# Patient Record
Sex: Female | Born: 1940
Health system: Southern US, Community
[De-identification: ages and names within clinical notes are randomized; demographics above are authoritative.]

## PROBLEM LIST (undated history)

## (undated) DIAGNOSIS — F32A Depression, unspecified: Secondary | ICD-10-CM

## (undated) DIAGNOSIS — H3530 Unspecified macular degeneration: Secondary | ICD-10-CM

## (undated) DIAGNOSIS — M79604 Pain in right leg: Secondary | ICD-10-CM

## (undated) DIAGNOSIS — F449 Dissociative and conversion disorder, unspecified: Secondary | ICD-10-CM

## (undated) DIAGNOSIS — I499 Cardiac arrhythmia, unspecified: Secondary | ICD-10-CM

## (undated) DIAGNOSIS — Z8781 Personal history of (healed) traumatic fracture: Secondary | ICD-10-CM

## (undated) DIAGNOSIS — H269 Unspecified cataract: Secondary | ICD-10-CM

## (undated) DIAGNOSIS — M1990 Unspecified osteoarthritis, unspecified site: Secondary | ICD-10-CM

## (undated) DIAGNOSIS — I10 Essential (primary) hypertension: Secondary | ICD-10-CM

## (undated) DIAGNOSIS — F319 Bipolar disorder, unspecified: Secondary | ICD-10-CM

## (undated) DIAGNOSIS — M25519 Pain in unspecified shoulder: Secondary | ICD-10-CM

## (undated) DIAGNOSIS — G4730 Sleep apnea, unspecified: Secondary | ICD-10-CM

## (undated) DIAGNOSIS — J849 Interstitial pulmonary disease, unspecified: Secondary | ICD-10-CM

## (undated) DIAGNOSIS — H35389 Toxic maculopathy, unspecified eye: Secondary | ICD-10-CM

## (undated) DIAGNOSIS — M79605 Pain in left leg: Secondary | ICD-10-CM

## (undated) DIAGNOSIS — G4734 Idiopathic sleep related nonobstructive alveolar hypoventilation: Secondary | ICD-10-CM

## (undated) DIAGNOSIS — I2510 Atherosclerotic heart disease of native coronary artery without angina pectoris: Secondary | ICD-10-CM

## (undated) DIAGNOSIS — N189 Chronic kidney disease, unspecified: Secondary | ICD-10-CM

## (undated) DIAGNOSIS — F419 Anxiety disorder, unspecified: Secondary | ICD-10-CM

## (undated) DIAGNOSIS — M48061 Spinal stenosis, lumbar region without neurogenic claudication: Secondary | ICD-10-CM

## (undated) DIAGNOSIS — I509 Heart failure, unspecified: Secondary | ICD-10-CM

## (undated) DIAGNOSIS — I38 Endocarditis, valve unspecified: Secondary | ICD-10-CM

## (undated) DIAGNOSIS — K219 Gastro-esophageal reflux disease without esophagitis: Secondary | ICD-10-CM

## (undated) DIAGNOSIS — J449 Chronic obstructive pulmonary disease, unspecified: Secondary | ICD-10-CM

## (undated) DIAGNOSIS — F329 Major depressive disorder, single episode, unspecified: Secondary | ICD-10-CM

## (undated) DIAGNOSIS — I6523 Occlusion and stenosis of bilateral carotid arteries: Secondary | ICD-10-CM

## (undated) DIAGNOSIS — B019 Varicella without complication: Secondary | ICD-10-CM

## (undated) DIAGNOSIS — N183 Chronic kidney disease, stage 3 unspecified: Secondary | ICD-10-CM

## (undated) DIAGNOSIS — K279 Peptic ulcer, site unspecified, unspecified as acute or chronic, without hemorrhage or perforation: Secondary | ICD-10-CM

## (undated) DIAGNOSIS — A0472 Enterocolitis due to Clostridium difficile, not specified as recurrent: Secondary | ICD-10-CM

## (undated) DIAGNOSIS — R011 Cardiac murmur, unspecified: Secondary | ICD-10-CM

## (undated) DIAGNOSIS — M1380 Other specified arthritis, unspecified site: Secondary | ICD-10-CM

## (undated) DIAGNOSIS — J45909 Unspecified asthma, uncomplicated: Secondary | ICD-10-CM

## (undated) DIAGNOSIS — I739 Peripheral vascular disease, unspecified: Secondary | ICD-10-CM

## (undated) DIAGNOSIS — E785 Hyperlipidemia, unspecified: Secondary | ICD-10-CM

## (undated) DIAGNOSIS — S92514A Nondisplaced fracture of proximal phalanx of right lesser toe(s), initial encounter for closed fracture: Secondary | ICD-10-CM

## (undated) HISTORY — DX: Unspecified cataract: H26.9

## (undated) HISTORY — DX: Varicella without complication: B01.9

## (undated) HISTORY — PX: APPENDECTOMY: SHX54

## (undated) HISTORY — PX: ERCP: SHX60

## (undated) HISTORY — DX: Hyperlipidemia, unspecified: E78.5

## (undated) HISTORY — DX: Chronic kidney disease, unspecified: N18.9

## (undated) HISTORY — DX: Toxic maculopathy, unspecified eye: H35.389

## (undated) HISTORY — DX: Enterocolitis due to Clostridium difficile, not specified as recurrent: A04.72

## (undated) HISTORY — PX: BUNIONECTOMY: SHX129

## (undated) HISTORY — DX: Unspecified osteoarthritis, unspecified site: M19.90

## (undated) HISTORY — DX: Heart failure, unspecified: I50.9

## (undated) HISTORY — PX: OTHER SURGICAL HISTORY: SHX169

## (undated) HISTORY — PX: TONSILLECTOMY: SHX5217

## (undated) HISTORY — PX: CATARACT EXTRACTION, BILATERAL: SHX1313

## (undated) HISTORY — PX: TUBAL LIGATION: SHX77

## (undated) HISTORY — DX: Chronic obstructive pulmonary disease, unspecified: J44.9

## (undated) HISTORY — PX: BACK SURGERY: SHX140

## (undated) HISTORY — PX: REPLACEMENT TOTAL KNEE: SUR1224

## (undated) HISTORY — PX: TOTAL HIP ARTHROPLASTY: SHX124

## (undated) HISTORY — DX: Pain in left leg: M79.604

## (undated) HISTORY — DX: Cardiac arrhythmia, unspecified: I49.9

## (undated) HISTORY — DX: Personal history of (healed) traumatic fracture: Z87.81

## (undated) HISTORY — DX: Unspecified macular degeneration: H35.30

## (undated) HISTORY — DX: Nondisplaced fracture of proximal phalanx of right lesser toe(s), initial encounter for closed fracture: S92.514A

## (undated) HISTORY — DX: Spinal stenosis, lumbar region without neurogenic claudication: M48.061

## (undated) HISTORY — DX: Unspecified asthma, uncomplicated: J45.909

## (undated) HISTORY — DX: Pain in right leg: M79.605

## (undated) NOTE — *Deleted (*Deleted)
10/18/2019 5:32 PM   Dale McDermitt Grier Rocher [birth year] [id number]  Referring provider: McLean-Scocuzza, Pasty Spillers, MD [address] [place],  [place] [address] No chief complaint on file.   HPI: Kerri Carter is a 40 y.o. female who returns today for an annual follow up of left renal mass, urinary frequency, urge incontinence and CKD stage III.   CT of the abdomen pelvis from 03/2016 revealed multiple hypodensities too small to characterize as well as a possible area of suspicious heterogeneity within the lower pole.  She is been followed with serial ultrasound since that time which have been unremarkable without evidence of lesions or tumors.  Renal ultrasound from 10/14/2018 was unremarkable, no masses lesions or tumors identified on her left kidney.  Her right kidney is surgically absent.  She has a stable left ovarian cyst.  Creatinine was 1.48 on 02/21/2019.   She also has a history of urinary urgency and urge incontinence.  She previously tried and failed Myrbetriq 25 mg and it was increased to 50 mg in 2019.  CT pelvis w/o contrast on 02/21/2019 noted no signs of fracture. Cystic left adnexal lesion has enlarged since 2018 where it measured approximately 1.8 x 1.7 cm. This is not well assessed due to streak artifact as well as lack of contrast.  Pelvic US complete with transvaginal on 04/05/19 simple appearing cystic structure within the left ovary measuring up to 2.9 cm. While this has enlarged since prior ultrasound, this has benign characteristics.   During last visit, she reported that she now has time to get to the bathroom especially first in the morning.  She took the medication the morning is effective all day.  She had decreased urinary urgency frequency.  She had no side effects from this medication.  She wanted to continue this medication.  Today***  1. Left renal mass  2. Urinary frequency  3. Urge incontinence  4. CKD (chronic kidney disease, stage III (HCC)   PMH:  Past Medical History:  Diagnosis Date  . Anxiety   . Asthma   . Bipolar disorder (HCC)   . Cataract    s/p b/l repair   . Chicken pox   . CKD (chronic kidney disease)   . CKD (chronic kidney disease), stage III    a. s/p R nephrectomy.  . Conversion disorder   . COPD (chronic obstructive pulmonary disease) (HCC)   . Depression   . Essential hypertension   . GERD (gastroesophageal reflux disease)   . Hyperlipidemia   . Inflammatory arthritis    a. hands/carpal tunnel.  b. Low titer rheumatoid factor. c. Negative anti-CCP antibodies. d. Plaquenil.  . Non-Obstructive CAD    a. 07/2009 Cath ([facility]): nonobs dzs;  b. 03/2011 Cath [facility]): nonobs dzs.  . Osteoarthritis    a. Knees.  . PUD (peptic ulcer disease)   . S/P right hip fracture    11/01/16 s/p repair  . Shoulder pain   . Sleep apnea    no cpap  . Spinal stenosis at L4-L5 level    severe with L4/L5 anterolisthesis grade 1 anterolisthesis   . Toxic maculopathy   . Valvular heart disease    a. 07/2015 Echo: EF 55-60%, Mild AI, AS, MR, and TR.    Surgical History: Past Surgical History:  Procedure Laterality Date  . APPENDECTOMY    . BACK SURGERY    . BUNIONECTOMY Right   . CATARACT EXTRACTION, BILATERAL    . CESAREAN SECTION     x1  .  CHOLECYSTECTOMY N/A 05/11/2016   Procedure: LAPAROSCOPIC CHOLECYSTECTOMY;  Surgeon: Lattie Haw, MD;  Location: [facility] OR[facility];  Service: General;  Laterality: N/A;  . COLONOSCOPY WITH PROPOFOL N/A 04/02/2016   Procedure: COLONOSCOPY WITH PROPOFOL;  Surgeon: Wyline Mood, MD;  Location: [facility];  Service: Endoscopy;  Laterality: N/A;  . ENDOSCOPIC RETROGRADE CHOLANGIOPANCREATOGRAPHY (ERCP) WITH PROPOFOL N/A 05/08/2016   Procedure: ENDOSCOPIC RETROGRADE CHOLANGIOPANCREATOGRAPHY (ERCP) WITH PROPOFOL;  Surgeon: Midge Minium, MD;  Location: [facility];  Service: Endoscopy;  Laterality: N/A;  . ERCP     with biliary spincterotomy 05/08/16 Dr. Servando Snare for choledocholithiasis   . ESOPHAGEAL DILATION   04/02/2016   Procedure: ESOPHAGEAL DILATION;  Surgeon: Wyline Mood, MD;  Location: [facility];  Service: Endoscopy;;  . ESOPHAGOGASTRODUODENOSCOPY (EGD) WITH PROPOFOL N/A 04/02/2016   Procedure: ESOPHAGOGASTRODUODENOSCOPY (EGD) WITH PROPOFOL;  Surgeon: Wyline Mood, MD;  Location: [facility];  Service: Endoscopy;  Laterality: N/A;  . HIP ARTHROPLASTY Right 11/01/2016   Procedure: ARTHROPLASTY BIPOLAR HIP (HEMIARTHROPLASTY);  Surgeon: Christena Flake, MD;  Location: [facility];  Service: Orthopedics;  Laterality: Right;  . 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 osteoporosis     noted DEXA 08/19/16   . REPLACEMENT TOTAL KNEE Right   . REVERSE SHOULDER ARTHROPLASTY Right 11/04/2017   Procedure: REVERSE SHOULDER ARTHROPLASTY;  Surgeon: Christena Flake, MD;  Location: [facility];  Service: Orthopedics;  Laterality: Right;  . REVERSE SHOULDER ARTHROPLASTY Left 07/26/2018   Procedure: REVERSE SHOULDER ARTHROPLASTY;  Surgeon: Christena Flake, MD;  Location: [facility];  Service: Orthopedics;  Laterality: Left;  . TONSILLECTOMY    . TOTAL HIP ARTHROPLASTY  12/10/11   [facility] left hip  . TOTAL HIP ARTHROPLASTY Bilateral   . TUBAL LIGATION      Home Medications:  Allergies as of 10/18/2019      Reactions   Ceftin [cefuroxime Axetil] Anaphylaxis   Lisinopril Anaphylaxis   Morphine Other (See Comments)   Per patient, low blood pressure issues that requires action to raise it back up. Can take small infrequent doses   Sulfasalazine Anaphylaxis   Aspirin Other (See Comments)   Sulfasalazine allergy cross reacts   Antihistamines, Chlorpheniramine-type Other (See Comments)   Makes pt hyper   Antivert [meclizine Hcl] Other (See Comments)   Bladder will not empty   Decongestant [pseudoephedrine Hcl] Other (See Comments)   Makes pt hyper   Doxycycline Other (See Comments)   GI upset   Polymyxin B Other (See Comments)   Medication was in eye drops.   Sulfa  Antibiotics Other (See Comments)   Face swelling   Xarelto [rivaroxaban] Other (See Comments)   Stomach burning, bleeding, and tar in stool   Adhesive [tape] Rash   Iodine Hives, Rash   Per patient allergy is to contrast dye only, she is able to use betadine scrubs.   Levaquin [levofloxacin In D5w] Rash   Tetanus Toxoids Rash, Other (See Comments)   Fever and hot to touch at injection site      Medication List       Accurate as of October 17, 2019  5:32 PM. If you have any questions, ask your nurse or doctor.        albuterol 108 (90 Base) MCG/ACT inhaler Commonly known as: Ventolin HFA Inhale 2 puffs into the lungs every 6 (six) hours as needed.   amLODipine 2.5 MG tablet Commonly known as: NORVASC Take 2.5 mg by mouth daily.  benzonatate 200 MG capsule Commonly known as: TESSALON Take 1 capsule (200 mg total) by mouth 3 (three) times daily as needed for cough.   budesonide 0.25 MG/2ML nebulizer solution Commonly known as: PULMICORT Take 2 mLs (0.25 mg total) by nebulization 2 (two) times daily.   Bystolic 10 MG tablet Generic drug: nebivolol TAKE 1 TABLET BY MOUTH EVERY DAY   Cholecalciferol 50 MCG (2000 UT) Caps Take by mouth.   dicyclomine 10 MG capsule Commonly known as: BENTYL TAKE 1 CAPSULE (10 MG TOTAL) BY MOUTH 4 (FOUR) TIMES DAILY - BEFORE MEALS AND AT BEDTIME.   escitalopram 10 MG tablet Commonly known as: LEXAPRO Take 1 tablet (10 mg total) by mouth daily.   famotidine 20 MG tablet Commonly known as: Pepcid Take 1 tablet (20 mg total) by mouth daily. Before breakfast or dinner   fluticasone 50 MCG/ACT nasal spray Commonly known as: Flonase Place 1 spray into both nostrils 2 (two) times daily.   gabapentin 300 MG capsule Commonly known as: NEURONTIN Take 2-3 capsules (600-900 mg total) by mouth See admin instructions. Take 600 mg by mouth in the morning and 900 mg at bedtime   Humira Pen 40 MG/0.4ML Pnkt Generic drug: Adalimumab Inject 40  mg into the skin every 14 (fourteen) days.   ipratropium-albuterol 0.5-2.5 (3) MG/3ML Soln Commonly known as: DUONEB Take 3 mLs by nebulization 3 (three) times daily as needed.   lamoTRIgine 100 MG tablet Commonly known as: LAMICTAL Take 1 tablet (100 mg total) by mouth 2 (two) times daily.   leflunomide 20 MG tablet Commonly known as: ARAVA Take 1 tablet (20 mg total) by mouth daily.   lovastatin 20 MG tablet Commonly known as: MEVACOR Take 1 tablet (20 mg total) by mouth at bedtime.   montelukast 10 MG tablet Commonly known as: SINGULAIR Take 1 tablet (10 mg total) by mouth daily.   multivitamin-lutein Caps capsule Take 1 capsule by mouth 2 (two) times daily.   Myrbetriq 50 MG Tb24 tablet Generic drug: mirabegron ER TAKE 1 TABLET BY MOUTH EVERYDAY AT BEDTIME   pantoprazole 40 MG tablet Commonly known as: PROTONIX Take 1 tablet (40 mg total) by mouth 2 (two) times daily. 30 minutes before food. Note reduction in frequency   QUEtiapine 25 MG tablet Commonly known as: SEROQUEL Take 1 tablet (25 mg total) by mouth at bedtime.   sucralfate 1 g tablet Commonly known as: CARAFATE Take 1 tablet (1 g total) by mouth 4 (four) times daily. TAKE 1 TABLET BY MOUTH 4 TIMES A DAY WITH MEALS AND AT BEDTIME       Allergies:  Allergies  Allergen Reactions  . Ceftin [Cefuroxime Axetil] Anaphylaxis  . Lisinopril Anaphylaxis  . Morphine Other (See Comments)    Per patient, low blood pressure issues that requires action to raise it back up. Can take small infrequent doses  . Sulfasalazine Anaphylaxis  . Aspirin Other (See Comments)    Sulfasalazine allergy cross reacts  . Antihistamines, Chlorpheniramine-Type Other (See Comments)    Makes pt hyper  . Antivert [Meclizine Hcl] Other (See Comments)    Bladder will not empty  . Decongestant [Pseudoephedrine Hcl] Other (See Comments)    Makes pt hyper  . Doxycycline Other (See Comments)    GI upset  . Polymyxin B Other (See  Comments)    Medication was in eye drops.  . Sulfa Antibiotics Other (See Comments)    Face swelling  . Xarelto [Rivaroxaban] Other (See Comments)    Stomach  burning, bleeding, and tar in stool  . Adhesive [Tape] Rash  . Iodine Hives and Rash    Per patient allergy is to contrast dye only, she is able to use betadine scrubs.  [employer] [Levofloxacin In D5w] Rash  . Tetanus Toxoids Rash and Other (See Comments)    Fever and hot to touch at injection site    Family History: Family History  Problem Relation Age of Onset  . Rheum arthritis Mother   . Asthma Mother   . Parkinson's disease Mother   . Heart disease Mother   . Stroke Mother   . Hypertension Mother   . Heart attack Father   . Heart disease Father   . Hypertension Father   . Peripheral Artery Disease Father   . Diabetes Son   . Gout Son   . Asthma Sister   . Heart disease Sister   . Lung cancer Sister   . Heart disease Sister   . Heart disease Sister   . Breast cancer Sister   . Heart attack Sister   . Heart disease Brother   . Heart disease Maternal Grandmother   . Diabetes Maternal Grandmother   . Colon cancer Maternal Grandmother   . Cancer Maternal Grandmother        Hodgkins lymphoma  . Heart disease Brother   . Alcohol abuse Brother   . Depression Brother   . Dementia Son     Social History:  reports that she quit smoking about 45 years ago. Her smoking use included cigarettes. She has a 10.00 pack-year smoking history. She has never used smokeless tobacco. She reports that she does not drink alcohol and does not use drugs.   Physical Exam: There were no vitals taken for this visit.  Constitutional:  Alert and oriented, No acute distress. HEENT: [place] AT, moist mucus membranes.  Trachea midline, no masses. Cardiovascular: No clubbing, cyanosis, or edema. Respiratory: Normal respiratory effort, no increased work of breathing. GI: Abdomen is soft, nontender, nondistended, no abdominal masses GU: No  CVA tenderness Lymph: No cervical or inguinal lymphadenopathy. Skin: No rashes, bruises or suspicious lesions. Neurologic: Grossly intact, no focal deficits, moving all 4 extremities. Psychiatric: Normal mood and affect.  Laboratory Data:  Lab Results  Component Value Date   CREATININE 1.48 (H) 02/21/2019    No results found for: PSA  No results found for: TESTOSTERONE  Lab Results  Component Value Date   HGBA1C 6.1 (H) 12/01/2018    Urinalysis   Pertinent Imaging: *** No results found for this or any previous visit.  No results found for this or any previous visit.  No results found for this or any previous visit.  No results found for this or any previous visit.  Results for orders placed during the hospital encounter of 10/13/18  US RENAL  Narrative CLINICAL DATA:  Left renal mass, history of right nephrectomy  EXAM: RENAL / URINARY TRACT ULTRASOUND COMPLETE  COMPARISON:  10/05/2017  FINDINGS: Right Kidney:  Surgically absent  Left Kidney:  Renal measurements: 9.6 x 5.0 x 4.4 cm. = volume: 132 mL. Echogenicity within normal limits. No mass or hydronephrosis visualized.  Bladder:  Appears normal for degree of bladder distention.  Left ovarian cyst is again identified and stable measuring 2.8 cm in greatest dimension.  IMPRESSION: Status post right nephrectomy.  No left renal mass is seen.  Stable left ovarian cyst.   Electronically Signed By: Alcide Clever M.D. On: 10/14/2018 08:20  No results found  for this or any previous visit.  No results found for this or any previous visit.  Results for orders placed during the hospital encounter of 06/12/16  CT Renal Stone Study  Narrative CLINICAL DATA:  Left flank pain extending around the left breast to the back.  EXAM: CT ABDOMEN AND PELVIS WITHOUT CONTRAST  TECHNIQUE: Multidetector CT imaging of the abdomen and pelvis was performed following the standard protocol without IV  contrast.  COMPARISON:  02/25/2016  FINDINGS: Lower chest:  No acute finding  Hepatobiliary: Pneumobilia. Patient had ERCP 05/08/2016. Cholecystectomy. There is fat reticulation in the right upper quadrant. Patient had cholecystectomy 05/11/2016. Inflammation is somewhat greater than expected for 1 month later, but there is no collection of extraluminal gas noted. Reportedly, patient's pain is on the left.No visible choledocholithiasis.  Pancreas: Unremarkable.  Spleen: Unremarkable.  Adrenals/Urinary Tract: Negative adrenals. Right nephrectomy. The distal left ureters obscured by streak artifact from hip prosthesis. No hydronephrosis. 8 mm left renal hilar calcification with features of peripherally calcified aneurysm, known. Negative urinary bladder.  Stomach/Bowel: No obstruction. No pericecal inflammation. Patient has history of appendectomy. Formed stool throughout the colon.  Vascular/Lymphatic: Aortic atherosclerosis.  No mass or adenopathy.  Reproductive:No pathologic findings.  Other: No ascites or pneumoperitoneum.  Laparoscopic port sites noted.  No abdominal wall fluid collection.  Musculoskeletal: Total left hip arthroplasty. Lumbar spinal degeneration, most notably advanced L4-5 facet arthropathy with anterolisthesis.  IMPRESSION: 1. No acute finding to explain left flank pain. No hydronephrosis or urolithiasis of the solitary left kidney. 2. Fat reticulation in the right upper quadrant. If asymptomatic this is presumably residual postsurgical changes from cholecystectomy 05/11/2016. No evidence of fluid collection. 3. Formed stool throughout the redundant colon. 4. Chronic findings are described above.   Electronically Signed By: Marnee Spring M.D. On: 06/12/2016 20:22   Assessment & Plan:     No follow-ups on file.  [facility] [address] [place], [place] [address] [phone]  I, Theador Hawthorne, am acting as a scribe for Dr. Vanna Scotland.  {Add Production assistant, radio Statement}

## (undated) NOTE — *Deleted (*Deleted)
10/25/2019 9:23 AM   Kerri Carter March 20, 1940 [id number]  Referring provider: McLean-Scocuzza, Pasty Spillers, MD [address] [place],  [place] [address] No chief complaint on file.   HPI: Kerri Carter is a 27 y.o. female who returns today for an annual follow up of left renal mass, urinary frequency, urge incontinence and CKD stage III.   CT of the abdomen pelvis from 03/2016 revealed multiple hypodensities too small to characterize as well as a possible area of suspicious heterogeneity within the lower pole.  She is been followed with serial ultrasound since that time which have been unremarkable without evidence of lesions or tumors.  Renal ultrasound from 10/14/2018 was unremarkable, no masses lesions or tumors identified on her left kidney.  Her right kidney is surgically absent.  She has a stable left ovarian cyst.  Creatinine was 1.48 on 02/21/2019.   She also has a history of urinary urgency and urge incontinence.  She previously tried and failed Myrbetriq 25 mg and it was increased to 50 mg in 2019.  CT pelvis w/o contrast on 02/21/2019 noted no signs of fracture. Cystic left adnexal lesion has enlarged since 2018 where it measured approximately 1.8 x 1.7 cm. This is not well assessed due to streak artifact as well as lack of contrast.  Pelvic US complete with transvaginal on 04/05/19 simple appearing cystic structure within the left ovary measuring up to 2.9 cm. While this has enlarged since prior ultrasound, this has benign characteristics.   During last visit, she reported that she now has time to get to the bathroom especially first in the morning.  She took the medication the morning is effective all day.  She had decreased urinary urgency frequency.  She had no side effects from this medication.  She wanted to continue this medication.  Today***  1. Left renal mass  2. Urinary frequency  3. Urge incontinence  4. CKD (chronic kidney disease, stage III (HCC)        PMH: Past Medical History:  Diagnosis Date  . Anxiety   . Asthma   . Bipolar disorder (HCC)   . Cataract    s/p b/l repair   . Chicken pox   . CKD (chronic kidney disease)   . CKD (chronic kidney disease), stage III    a. s/p R nephrectomy.  . Conversion disorder   . COPD (chronic obstructive pulmonary disease) (HCC)   . Depression   . Essential hypertension   . GERD (gastroesophageal reflux disease)   . Hyperlipidemia   . Inflammatory arthritis    a. hands/carpal tunnel.  b. Low titer rheumatoid factor. c. Negative anti-CCP antibodies. d. Plaquenil.  . Non-Obstructive CAD    a. 07/2009 Cath ([facility]): nonobs dzs;  b. 03/2011 Cath [facility]): nonobs dzs.  . Osteoarthritis    a. Knees.  . PUD (peptic ulcer disease)   . S/P right hip fracture    11/01/16 s/p repair  . Shoulder pain   . Sleep apnea    no cpap  . Spinal stenosis at L4-L5 level    severe with L4/L5 anterolisthesis grade 1 anterolisthesis   . Toxic maculopathy   . Valvular heart disease    a. 07/2015 Echo: EF 55-60%, Mild AI, AS, MR, and TR.    Surgical History: Past Surgical History:  Procedure Laterality Date  . APPENDECTOMY    . BACK SURGERY    . BUNIONECTOMY Right   . CATARACT EXTRACTION, BILATERAL    . CESAREAN SECTION  x1  . CHOLECYSTECTOMY N/A 05/11/2016   Procedure: LAPAROSCOPIC CHOLECYSTECTOMY;  Surgeon: Lattie Haw, MD;  Location: [facility] ORS;  Service: General;  Laterality: N/A;  . COLONOSCOPY WITH PROPOFOL N/A 04/02/2016   Procedure: COLONOSCOPY WITH PROPOFOL;  Surgeon: Wyline Mood, MD;  Location: [facility] ENDOSCOPY;  Service: Endoscopy;  Laterality: N/A;  . ENDOSCOPIC RETROGRADE CHOLANGIOPANCREATOGRAPHY (ERCP) WITH PROPOFOL N/A 05/08/2016   Procedure: ENDOSCOPIC RETROGRADE CHOLANGIOPANCREATOGRAPHY (ERCP) WITH PROPOFOL;  Surgeon: Midge Minium, MD;  Location: [facility] ENDOSCOPY;  Service: Endoscopy;  Laterality: N/A;  . ERCP     with biliary spincterotomy 05/08/16 Dr. Servando Snare for choledocholithiasis   . ESOPHAGEAL  DILATION  04/02/2016   Procedure: ESOPHAGEAL DILATION;  Surgeon: Wyline Mood, MD;  Location: [facility] ENDOS[facility];  Service: Endoscopy;;  . ESOPHAGOGASTRODUODENOSCOPY (EGD) WITH PROPOFOL N/A 04/02/2016   Procedure: ESOPHAGOGASTRODUODENOSCOPY (EGD) WITH PROPOFOL;  Surgeon: Wyline Mood, MD;  Location: [facility] ENDOS[facility];  Service: Endoscopy;  Laterality: N/A;  . HIP ARTHROPLASTY Right 11/01/2016   Procedure: ARTHROPLASTY BIPOLAR HIP (HEMIARTHROPLASTY);  Surgeon: Christena Flake, MD;  Location: [facility] OR[facility];  Service: Orthopedics;  Laterality: Right;  . 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 osteoporosis     noted DEXA 08/19/16   . REPLACEMENT TOTAL KNEE Right   . REVERSE SHOULDER ARTHROPLASTY Right 11/04/2017   Procedure: REVERSE SHOULDER ARTHROPLASTY;  Surgeon: Christena Flake, MD;  Location: [facility] OR[facility];  Service: Orthopedics;  Laterality: Right;  . REVERSE SHOULDER ARTHROPLASTY Left 07/26/2018   Procedure: REVERSE SHOULDER ARTHROPLASTY;  Surgeon: Christena Flake, MD;  Location: [facility];  Service: Orthopedics;  Laterality: Left;  . TONSILLECTOMY    . TOTAL HIP ARTHROPLASTY  12/10/11   [facility] left hip  . TOTAL HIP ARTHROPLASTY Bilateral   . TUBAL LIGATION      Home Medications:  Allergies as of 10/25/2019      Reactions   Ceftin [cefuroxime Axetil] Anaphylaxis   Lisinopril Anaphylaxis   Morphine Other (See Comments)   Per patient, low blood pressure issues that requires action to raise it back up. Can take small infrequent doses   Sulfasalazine Anaphylaxis   Aspirin Other (See Comments)   Sulfasalazine allergy cross reacts   Antihistamines, Chlorpheniramine-type Other (See Comments)   Makes pt hyper   Antivert [meclizine Hcl] Other (See Comments)   Bladder will not empty   Decongestant [pseudoephedrine Hcl] Other (See Comments)   Makes pt hyper   Doxycycline Other (See Comments)   GI upset   Polymyxin B Other (See Comments)   Medication was in eye drops.    Sulfa Antibiotics Other (See Comments)   Face swelling   Xarelto [rivaroxaban] Other (See Comments)   Stomach burning, bleeding, and tar in stool   Adhesive [tape] Rash   Iodine Hives, Rash   Per patient allergy is to contrast dye only, she is able to use betadine scrubs.   Levaquin [levofloxacin In D5w] Rash   Tetanus Toxoids Rash, Other (See Comments)   Fever and hot to touch at injection site      Medication List       Accurate as of October 18, 2019  9:23 AM. If you have any questions, ask your nurse or doctor.        albuterol 108 (90 Base) MCG/ACT inhaler Commonly known as: Ventolin HFA Inhale 2 puffs into the lungs every 6 (six) hours as needed.   amLODipine 2.5 MG tablet Commonly known as: NORVASC Take 2.5 mg by mouth  daily.   benzonatate 200 MG capsule Commonly known as: TESSALON Take 1 capsule (200 mg total) by mouth 3 (three) times daily as needed for cough.   budesonide 0.25 MG/2ML nebulizer solution Commonly known as: PULMICORT Take 2 mLs (0.25 mg total) by nebulization 2 (two) times daily.   Bystolic 10 MG tablet Generic drug: nebivolol TAKE 1 TABLET BY MOUTH EVERY DAY   Cholecalciferol 50 MCG (2000 UT) Caps Take by mouth.   dicyclomine 10 MG capsule Commonly known as: BENTYL TAKE 1 CAPSULE (10 MG TOTAL) BY MOUTH 4 (FOUR) TIMES DAILY - BEFORE MEALS AND AT BEDTIME.   escitalopram 10 MG tablet Commonly known as: LEXAPRO Take 1 tablet (10 mg total) by mouth daily.   famotidine 20 MG tablet Commonly known as: Pepcid Take 1 tablet (20 mg total) by mouth daily. Before breakfast or dinner   fluticasone 50 MCG/ACT nasal spray Commonly known as: Flonase Place 1 spray into both nostrils 2 (two) times daily.   gabapentin 300 MG capsule Commonly known as: NEURONTIN Take 2-3 capsules (600-900 mg total) by mouth See admin instructions. Take 600 mg by mouth in the morning and 900 mg at bedtime   Humira Pen 40 MG/0.4ML Pnkt Generic drug: Adalimumab  Inject 40 mg into the skin every 14 (fourteen) days.   ipratropium-albuterol 0.5-2.5 (3) MG/3ML Soln Commonly known as: DUONEB Take 3 mLs by nebulization 3 (three) times daily as needed.   lamoTRIgine 100 MG tablet Commonly known as: LAMICTAL Take 1 tablet (100 mg total) by mouth 2 (two) times daily.   leflunomide 20 MG tablet Commonly known as: ARAVA Take 1 tablet (20 mg total) by mouth daily.   lovastatin 20 MG tablet Commonly known as: MEVACOR Take 1 tablet (20 mg total) by mouth at bedtime.   montelukast 10 MG tablet Commonly known as: SINGULAIR Take 1 tablet (10 mg total) by mouth daily.   multivitamin-lutein Caps capsule Take 1 capsule by mouth 2 (two) times daily.   Myrbetriq 50 MG Tb24 tablet Generic drug: mirabegron ER TAKE 1 TABLET BY MOUTH EVERYDAY AT BEDTIME   pantoprazole 40 MG tablet Commonly known as: PROTONIX Take 1 tablet (40 mg total) by mouth 2 (two) times daily. 30 minutes before food. Note reduction in frequency   QUEtiapine 25 MG tablet Commonly known as: SEROQUEL Take 1 tablet (25 mg total) by mouth at bedtime.   sucralfate 1 g tablet Commonly known as: CARAFATE Take 1 tablet (1 g total) by mouth 4 (four) times daily. TAKE 1 TABLET BY MOUTH 4 TIMES A DAY WITH MEALS AND AT BEDTIME       Allergies:  Allergies  Allergen Reactions  . Ceftin [Cefuroxime Axetil] Anaphylaxis  . Lisinopril Anaphylaxis  . Morphine Other (See Comments)    Per patient, low blood pressure issues that requires action to raise it back up. Can take small infrequent doses  . Sulfasalazine Anaphylaxis  . Aspirin Other (See Comments)    Sulfasalazine allergy cross reacts  . Antihistamines, Chlorpheniramine-Type Other (See Comments)    Makes pt hyper  . Antivert [Meclizine Hcl] Other (See Comments)    Bladder will not empty  . Decongestant [Pseudoephedrine Hcl] Other (See Comments)    Makes pt hyper  . Doxycycline Other (See Comments)    GI upset  . Polymyxin B  Other (See Comments)    Medication was in eye drops.  . Sulfa Antibiotics Other (See Comments)    Face swelling  . Xarelto [Rivaroxaban] Other (See Comments)  Stomach burning, bleeding, and tar in stool  . Adhesive [Tape] Rash  . Iodine Hives and Rash    Per patient allergy is to contrast dye only, she is able to use betadine scrubs.  [employer] [Levofloxacin In D5w] Rash  . Tetanus Toxoids Rash and Other (See Comments)    Fever and hot to touch at injection site    Family History: Family History  Problem Relation Age of Onset  . Rheum arthritis Mother   . Asthma Mother   . Parkinson's disease Mother   . Heart disease Mother   . Stroke Mother   . Hypertension Mother   . Heart attack Father   . Heart disease Father   . Hypertension Father   . Peripheral Artery Disease Father   . Diabetes Son   . Gout Son   . Asthma Sister   . Heart disease Sister   . Lung cancer Sister   . Heart disease Sister   . Heart disease Sister   . Breast cancer Sister   . Heart attack Sister   . Heart disease Brother   . Heart disease Maternal Grandmother   . Diabetes Maternal Grandmother   . Colon cancer Maternal Grandmother   . Cancer Maternal Grandmother        Hodgkins lymphoma  . Heart disease Brother   . Alcohol abuse Brother   . Depression Brother   . Dementia Son     Social History:  reports that she quit smoking about 45 years ago. Her smoking use included cigarettes. She has a 10.00 pack-year smoking history. She has never used smokeless tobacco. She reports that she does not drink alcohol and does not use drugs.   Physical Exam: There were no vitals taken for this visit.  Constitutional:  Alert and oriented, No acute distress. HEENT: [place] AT, moist mucus membranes.  Trachea midline, no masses. Cardiovascular: No clubbing, cyanosis, or edema. Respiratory: Normal respiratory effort, no increased work of breathing. GI: Abdomen is soft, nontender, nondistended, no abdominal  masses GU: No CVA tenderness Lymph: No cervical or inguinal lymphadenopathy. Skin: No rashes, bruises or suspicious lesions. Neurologic: Grossly intact, no focal deficits, moving all 4 extremities. Psychiatric: Normal mood and affect.  Laboratory Data:  Lab Results  Component Value Date   CREATININE 1.48 (H) 02/21/2019    No results found for: PSA  No results found for: TESTOSTERONE  Lab Results  Component Value Date   HGBA1C 6.1 (H) 12/01/2018    Urinalysis   Pertinent Imaging: *** No results found for this or any previous visit.  No results found for this or any previous visit.  No results found for this or any previous visit.  No results found for this or any previous visit.  Results for orders placed during the hospital encounter of 10/13/18  US RENAL  Narrative CLINICAL DATA:  Left renal mass, history of right nephrectomy  EXAM: RENAL / URINARY TRACT ULTRASOUND COMPLETE  COMPARISON:  10/05/2017  FINDINGS: Right Kidney:  Surgically absent  Left Kidney:  Renal measurements: 9.6 x 5.0 x 4.4 cm. = volume: 132 mL. Echogenicity within normal limits. No mass or hydronephrosis visualized.  Bladder:  Appears normal for degree of bladder distention.  Left ovarian cyst is again identified and stable measuring 2.8 cm in greatest dimension.  IMPRESSION: Status post right nephrectomy.  No left renal mass is seen.  Stable left ovarian cyst.   Electronically Signed By: Alcide Clever M.D. On: 10/14/2018 08:20  No results  found for this or any previous visit.  No results found for this or any previous visit.  Results for orders placed during the hospital encounter of 06/12/16  CT Renal Stone Study  Narrative CLINICAL DATA:  Left flank pain extending around the left breast to the back.  EXAM: CT ABDOMEN AND PELVIS WITHOUT CONTRAST  TECHNIQUE: Multidetector CT imaging of the abdomen and pelvis was performed following the standard protocol  without IV contrast.  COMPARISON:  02/25/2016  FINDINGS: Lower chest:  No acute finding  Hepatobiliary: Pneumobilia. Patient had ERCP 05/08/2016. Cholecystectomy. There is fat reticulation in the right upper quadrant. Patient had cholecystectomy 05/11/2016. Inflammation is somewhat greater than expected for 1 month later, but there is no collection of extraluminal gas noted. Reportedly, patient's pain is on the left.No visible choledocholithiasis.  Pancreas: Unremarkable.  Spleen: Unremarkable.  Adrenals/Urinary Tract: Negative adrenals. Right nephrectomy. The distal left ureters obscured by streak artifact from hip prosthesis. No hydronephrosis. 8 mm left renal hilar calcification with features of peripherally calcified aneurysm, known. Negative urinary bladder.  Stomach/Bowel: No obstruction. No pericecal inflammation. Patient has history of appendectomy. Formed stool throughout the colon.  Vascular/Lymphatic: Aortic atherosclerosis.  No mass or adenopathy.  Reproductive:No pathologic findings.  Other: No ascites or pneumoperitoneum.  Laparoscopic port sites noted.  No abdominal wall fluid collection.  Musculoskeletal: Total left hip arthroplasty. Lumbar spinal degeneration, most notably advanced L4-5 facet arthropathy with anterolisthesis.  IMPRESSION: 1. No acute finding to explain left flank pain. No hydronephrosis or urolithiasis of the solitary left kidney. 2. Fat reticulation in the right upper quadrant. If asymptomatic this is presumably residual postsurgical changes from cholecystectomy 05/11/2016. No evidence of fluid collection. 3. Formed stool throughout the redundant colon. 4. Chronic findings are described above.   Electronically Signed By: Marnee Spring M.D. On: 06/12/2016 20:22   Assessment & Plan:     No follow-ups on file.  [facility] [address], [address] [place], [place] [address] [phone]   I,  Theador Hawthorne, am acting as a scribe for Dr. Vanna Scotland.  {Add Production assistant, radio Statement}

---

## 1986-02-02 HISTORY — PX: NEPHRECTOMY: SHX65

## 2004-08-29 ENCOUNTER — Ambulatory Visit: Payer: Self-pay | Admitting: Internal Medicine

## 2005-01-01 ENCOUNTER — Ambulatory Visit: Payer: Self-pay | Admitting: Internal Medicine

## 2005-01-23 ENCOUNTER — Ambulatory Visit: Payer: Self-pay | Admitting: Internal Medicine

## 2005-08-28 IMAGING — US US RENAL KIDNEY
1 series · 17 of 19 positions shown · non-contrast
Comparison: none

REASON FOR EXAM: renal insufficiency
COMMENTS:

[Series 1: us renal kidney · 17 of 19 slices shown]
[im 1/19]
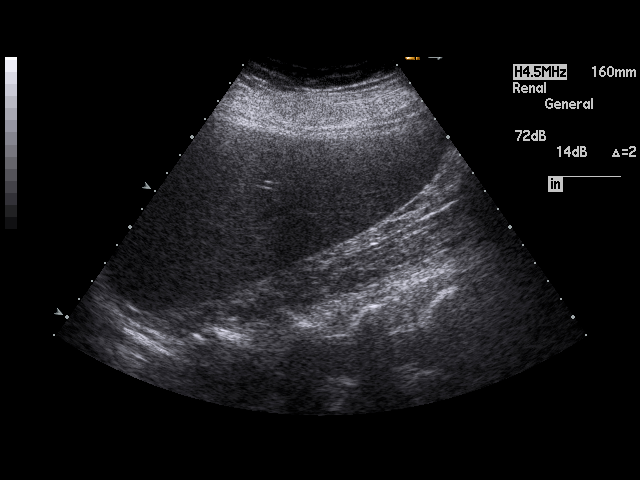
[im 2/19]
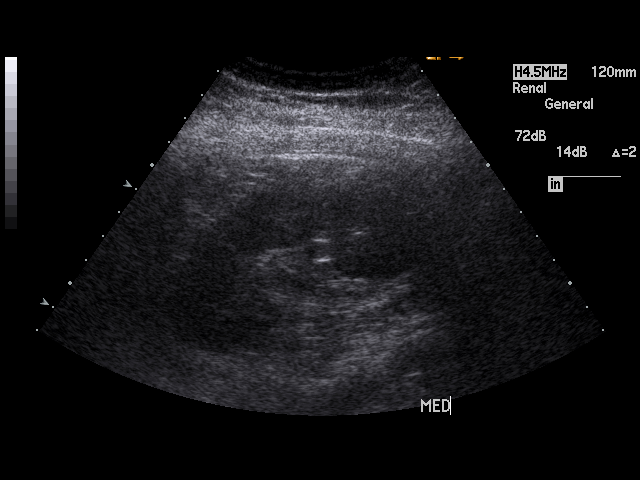
[im 3/19]
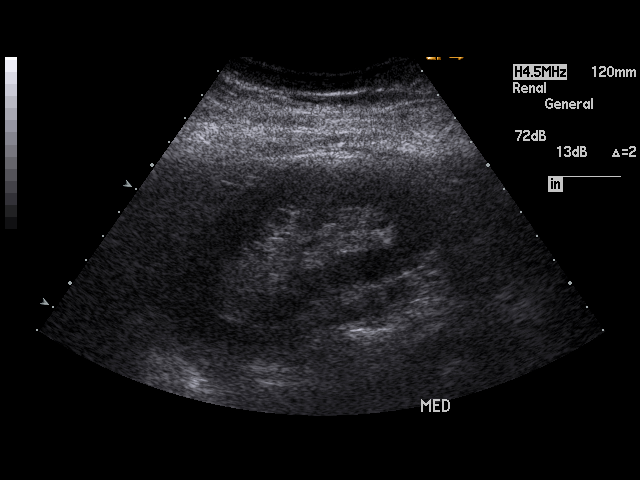
[im 4/19]
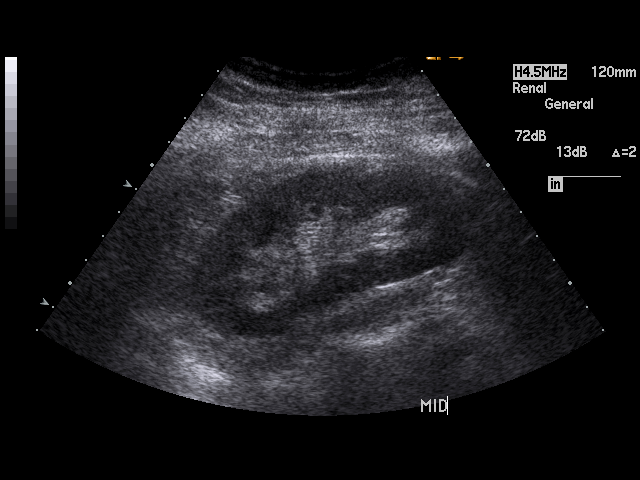
[im 6/19]
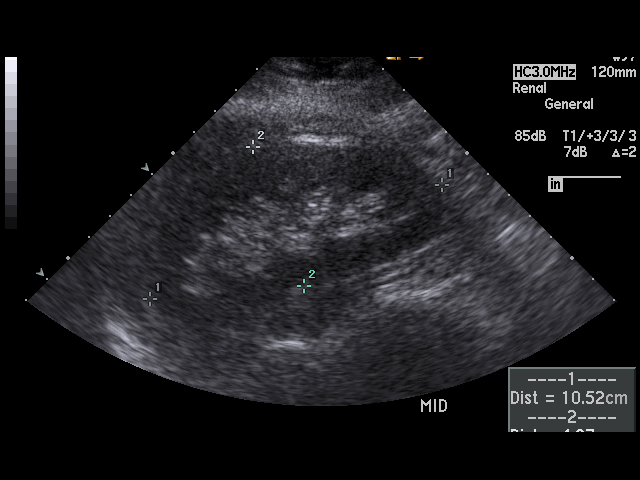
[im 7/19]
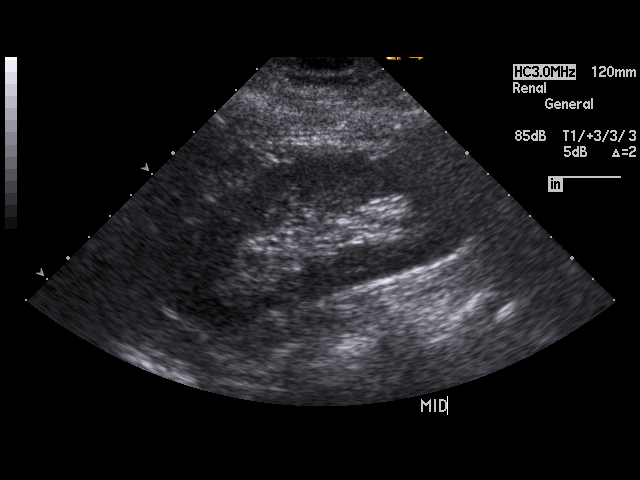
[im 8/19]
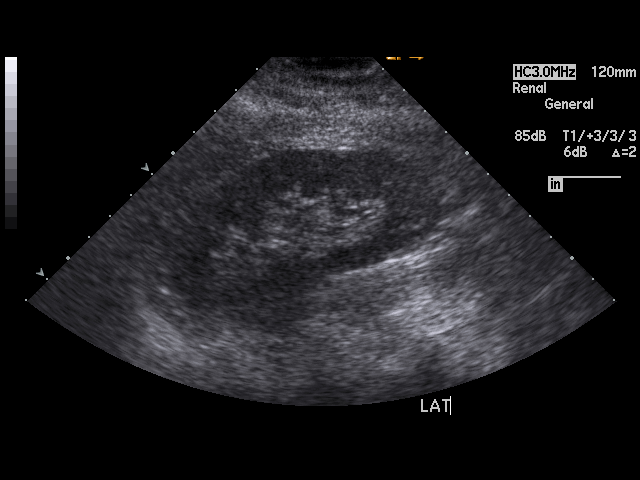
[im 9/19]
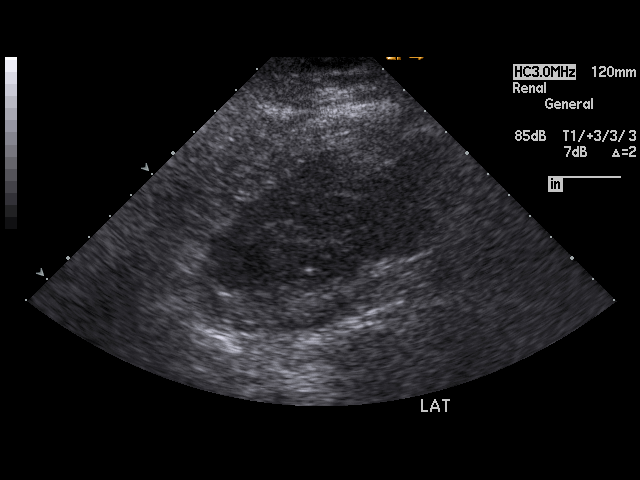
[im 10/19]
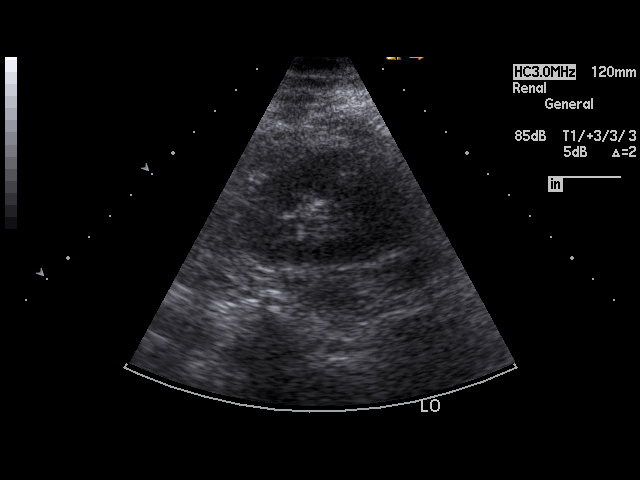
[im 11/19]
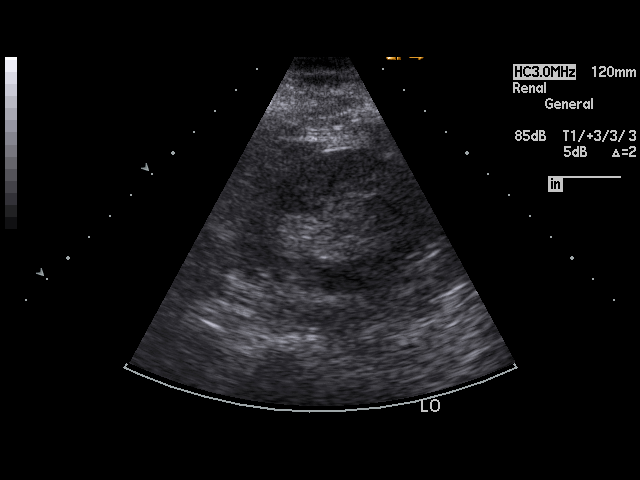
[im 12/19]
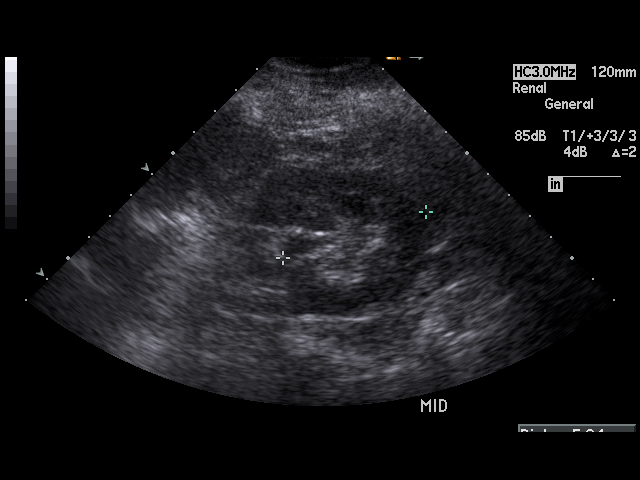
[im 13/19]
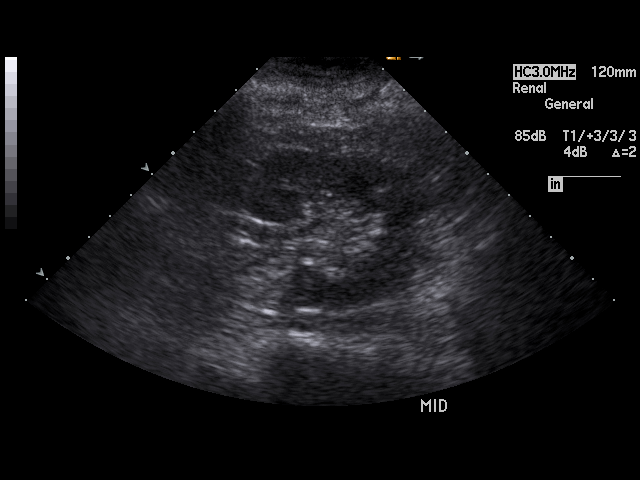
[im 14/19]
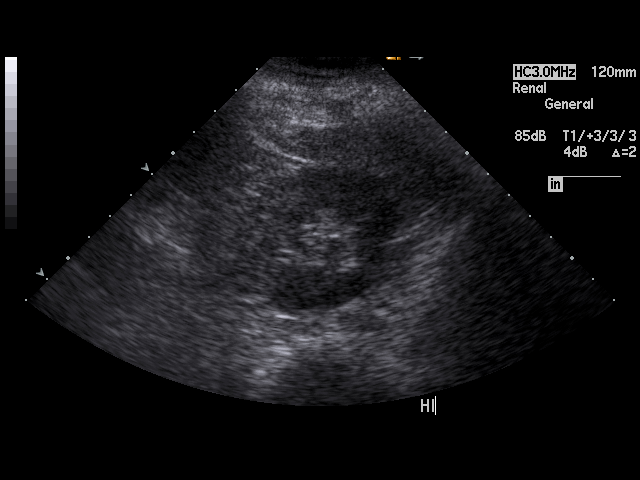
[im 16/19]
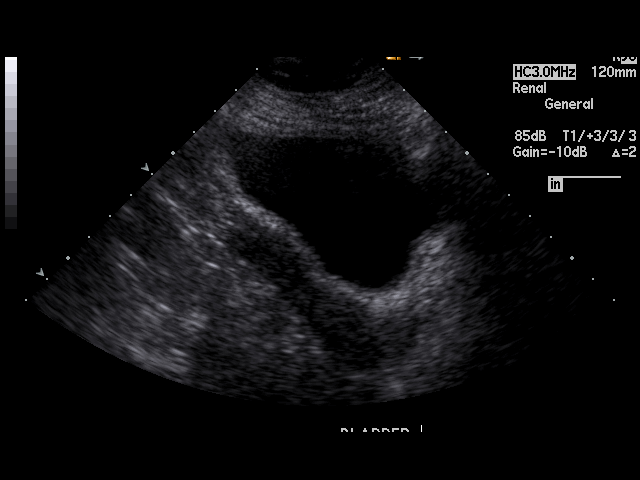
[im 17/19]
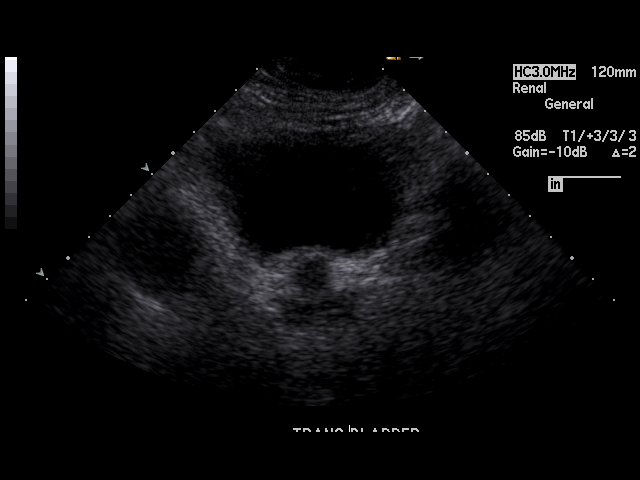
[im 18/19]
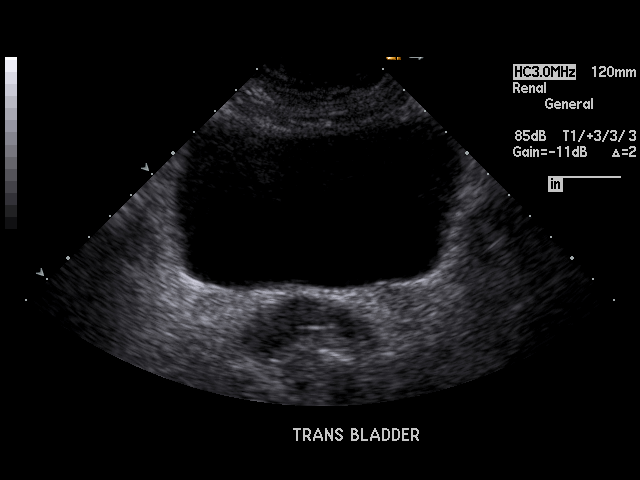
[im 19/19]
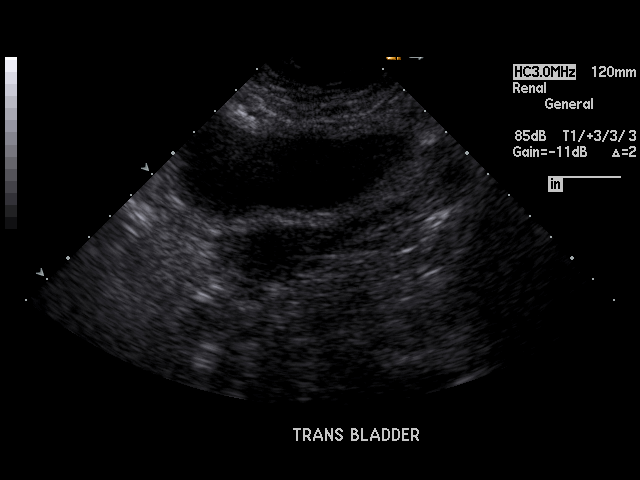

[17 of 19 positions shown; findings below may reference images not displayed]

PROCEDURE:     US  - US KIDNEY BILATERAL  - August 29, 2004  [DATE]

RESULT:     The LEFT kidney measures 10.52 cm x 4.97 cm x 5.04 cm.  The
RIGHT kidney is not seen compatible with the patient's history of prior
RIGHT nephrectomy.  No solid or cystic renal mass lesions are seen on the
LEFT. The renal cortex is smooth.  There is no hydronephrosis.  No renal
calcifications are seen. The visualized portion of the urinary bladder is
normal in appearance. There is no ascites.
IMPRESSION: 1)No significant abnormalities are noted.

2)The patient is status post RIGHT nephrectomy.

## 2005-09-09 ENCOUNTER — Ambulatory Visit: Payer: Self-pay | Admitting: Internal Medicine

## 2006-06-05 ENCOUNTER — Emergency Department: Payer: Self-pay | Admitting: Emergency Medicine

## 2006-09-17 ENCOUNTER — Ambulatory Visit: Payer: Self-pay | Admitting: Gastroenterology

## 2007-05-16 ENCOUNTER — Ambulatory Visit: Payer: Self-pay | Admitting: Internal Medicine

## 2007-06-04 IMAGING — CR NECK SOFT TISSUES - 1+ VIEW
1 series · 2 of 2 positions shown · non-contrast
Comparison: none

REASON FOR EXAM: throat  pain foreign body
COMMENTS:

PROCEDURE:     DXR - DXR SOFT TISSUE NECK  - June 05, 2006  [DATE]
RESULT:     Soft tissue structures are unremarkable.  The epiglottis is
normal. No bony abnormality is identified.

[Series 1: view not recorded · 0.17mm/px · 2 of 2 slices shown]
[im 1/2]
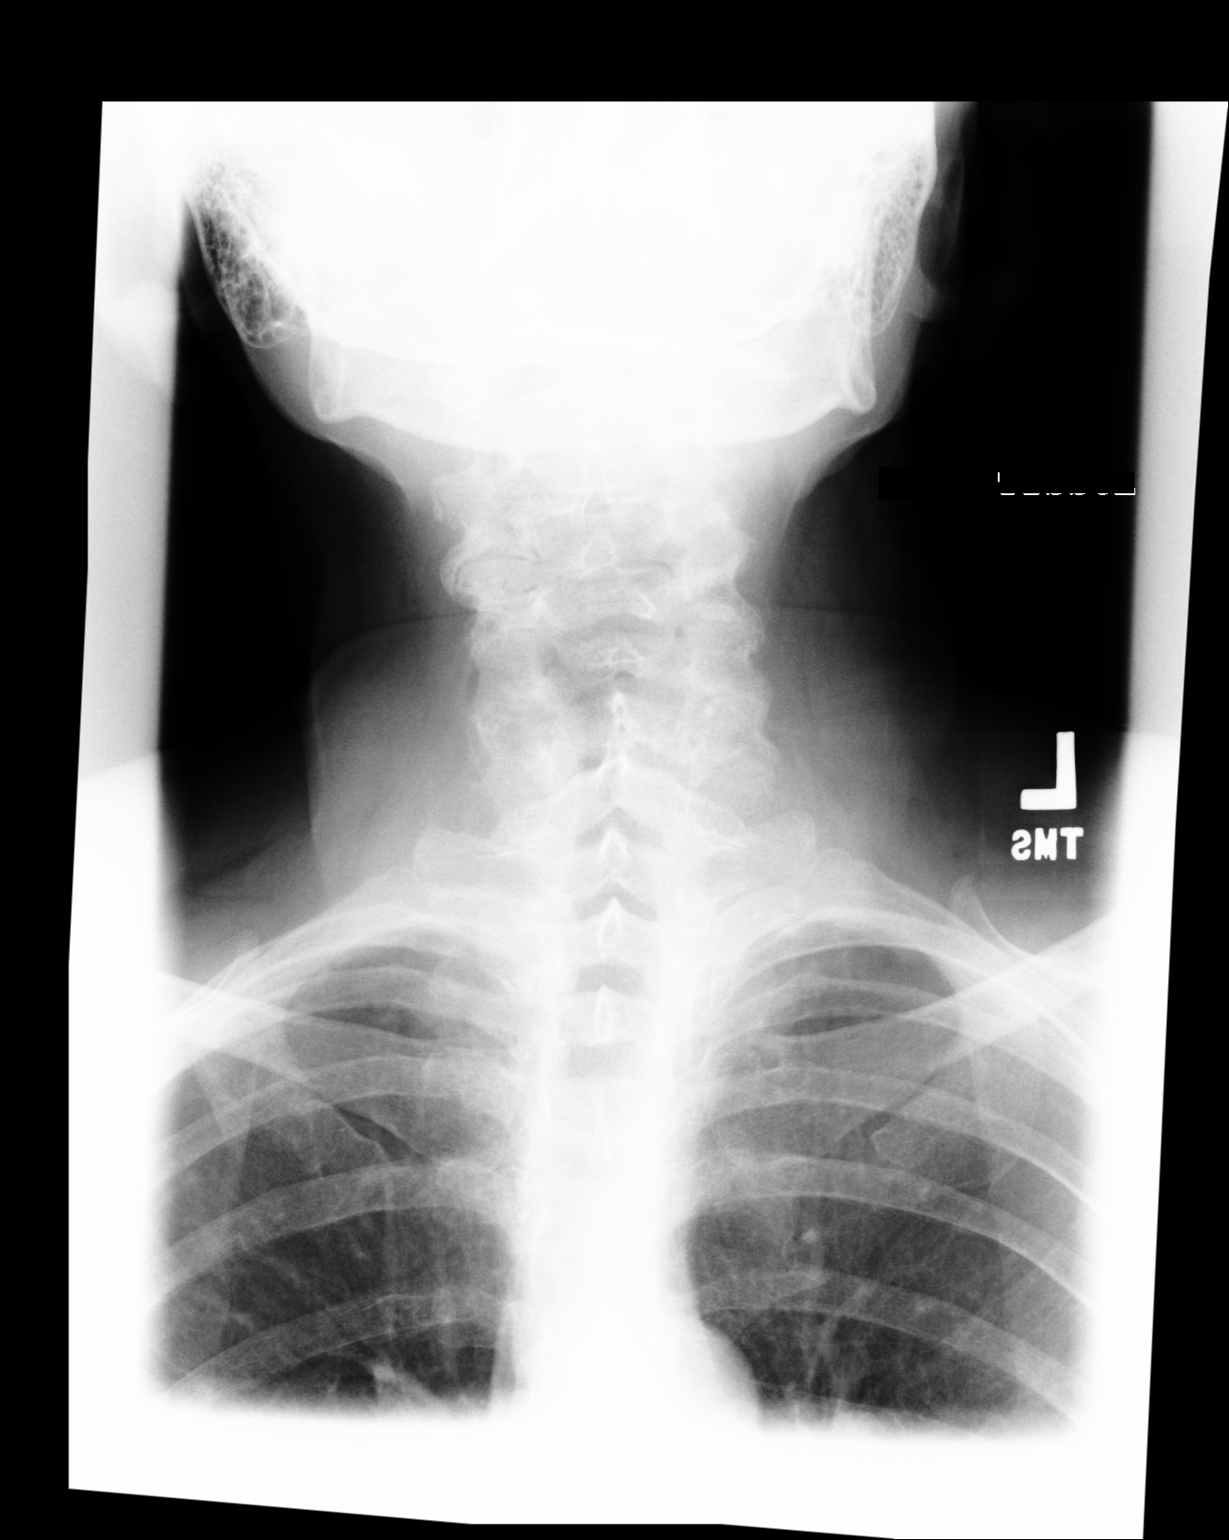
[im 2/2]
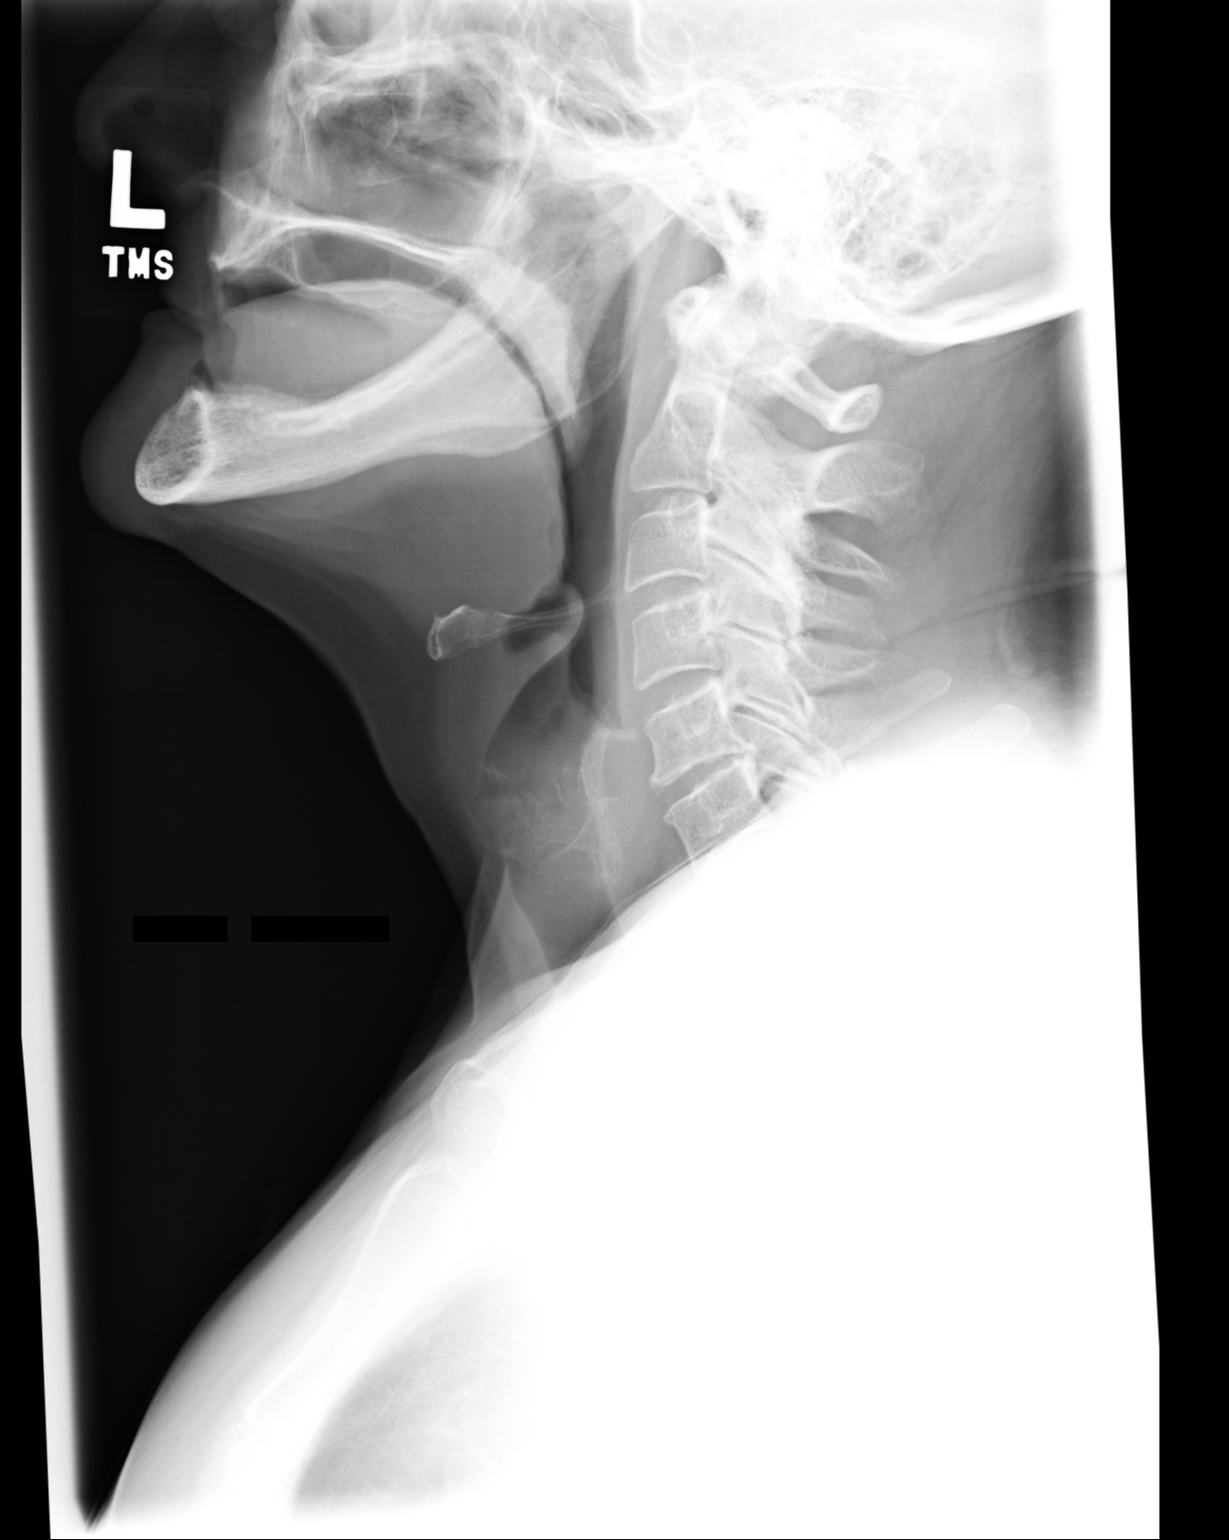

[2 of 2 positions shown; findings below may reference images not displayed]

IMPRESSION: 1)Normal exam.

## 2007-06-04 IMAGING — CR DG CHEST 2V
1 series · 2 of 2 positions shown · non-contrast
Comparison: none

REASON FOR EXAM: chest pain
COMMENTS:

PROCEDURE:     DXR - DXR CHEST PA (OR AP) AND LATERAL  - June 05, 2006  [DATE]
RESULT:     The lungs are clear. Cardiovascular structures are unremarkable.

[Series 1: view not recorded · 0.17mm/px · 2 of 2 slices shown]
[im 1/2]
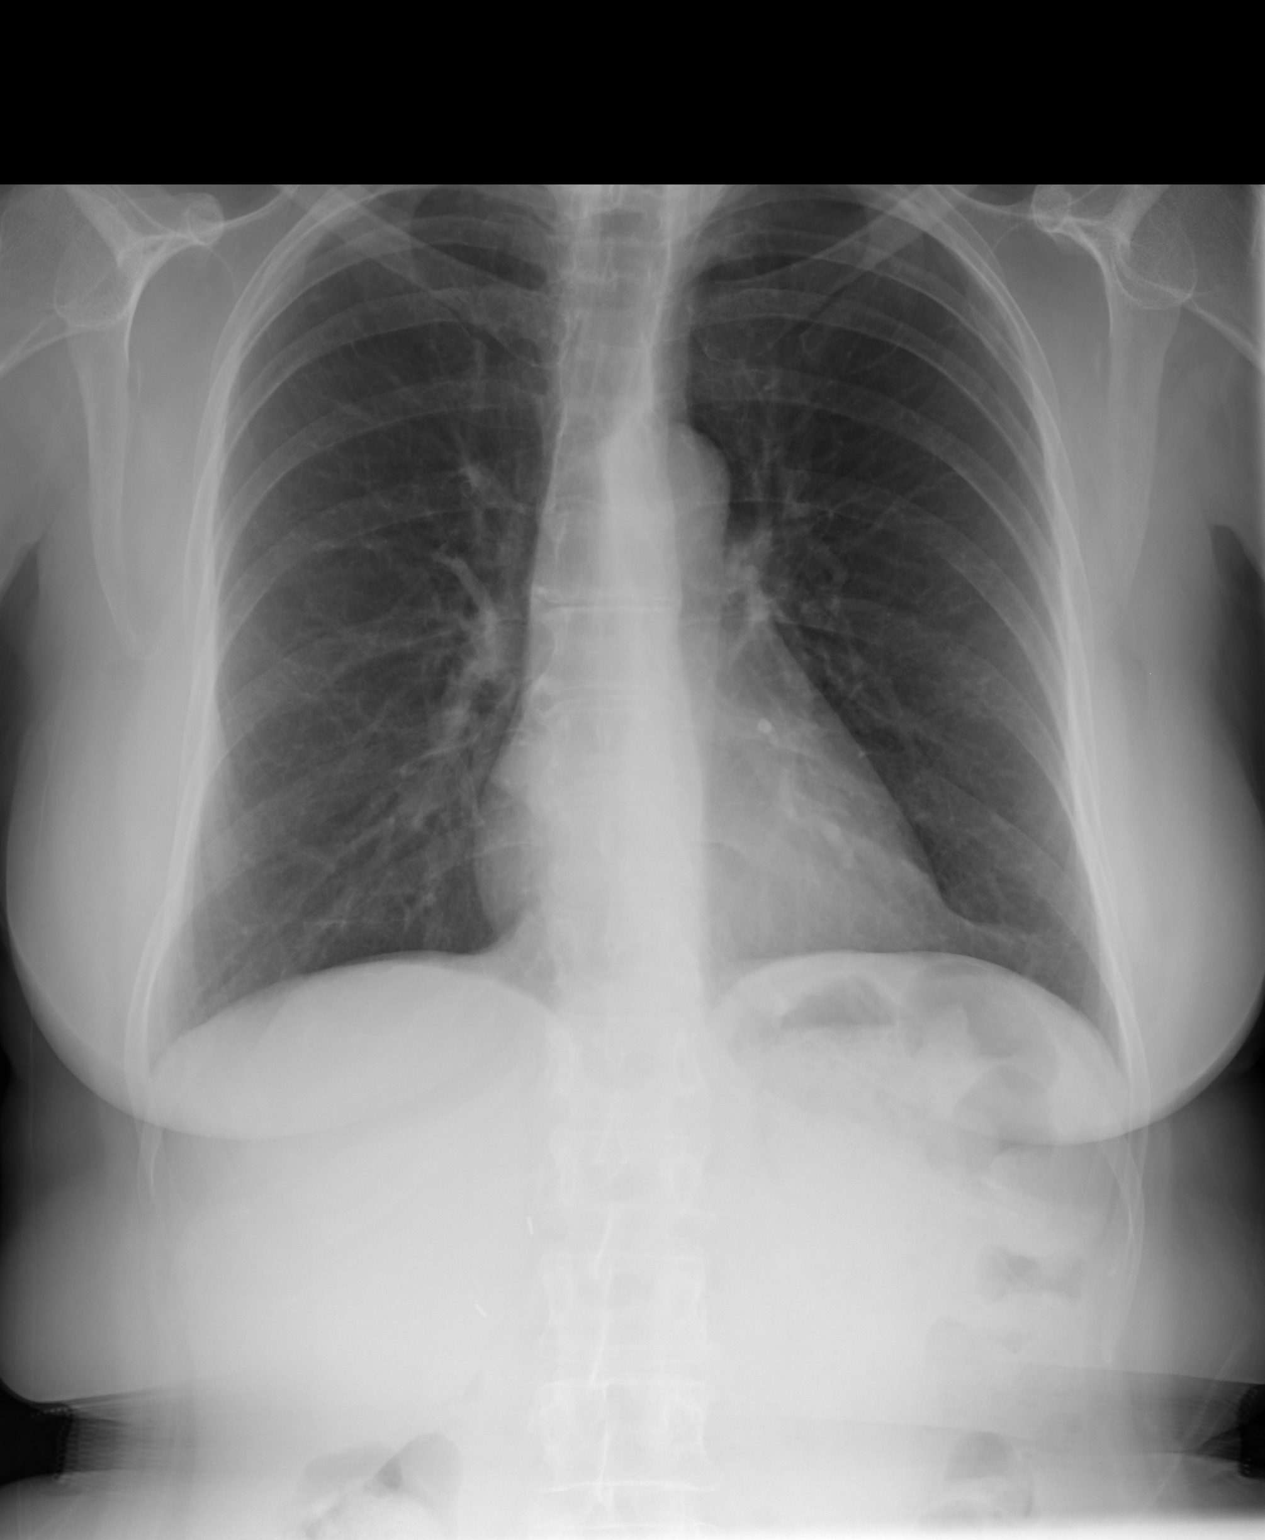
[im 2/2]
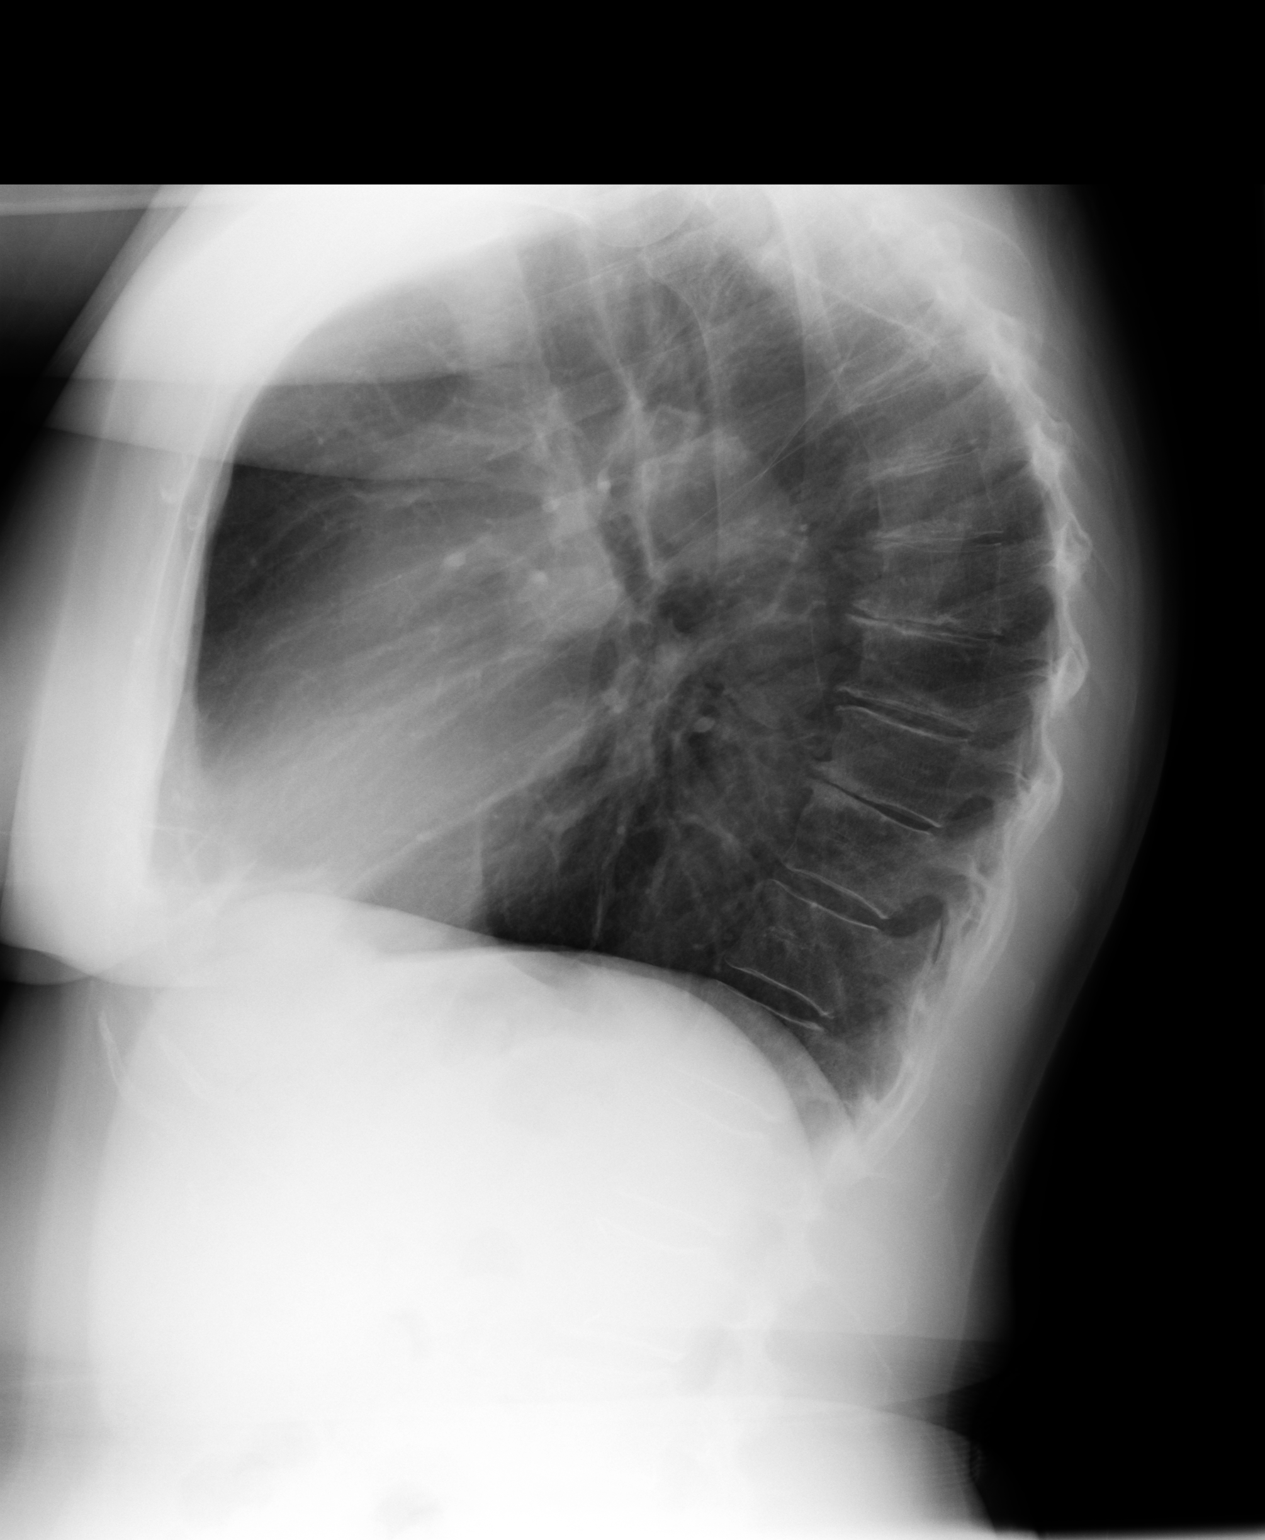

[2 of 2 positions shown; findings below may reference images not displayed]

IMPRESSION: 1)No acute cardiopulmonary disease.

## 2008-02-03 DIAGNOSIS — A0472 Enterocolitis due to Clostridium difficile, not specified as recurrent: Secondary | ICD-10-CM

## 2008-02-03 HISTORY — DX: Enterocolitis due to Clostridium difficile, not specified as recurrent: A04.72

## 2008-08-13 ENCOUNTER — Ambulatory Visit: Payer: Self-pay | Admitting: Internal Medicine

## 2008-11-04 ENCOUNTER — Inpatient Hospital Stay: Payer: Self-pay | Admitting: Internal Medicine

## 2009-02-13 ENCOUNTER — Ambulatory Visit: Payer: Self-pay | Admitting: Internal Medicine

## 2009-06-17 ENCOUNTER — Observation Stay: Payer: Self-pay | Admitting: Internal Medicine

## 2009-07-03 ENCOUNTER — Inpatient Hospital Stay: Payer: Self-pay | Admitting: Internal Medicine

## 2009-07-03 ENCOUNTER — Ambulatory Visit: Payer: Self-pay

## 2009-11-02 IMAGING — CT CT ABD-PELV W/O CM
1 of 2 series · 15 of 32 positions shown, 19 images · non-contrast
Comparison: No comparison

REASON FOR EXAM: (1) abd pain   oral barium only; (2) abd pain   oral
barium only   [REDACTED]
COMMENTS:

PROCEDURE:     CT  - CT ABDOMEN AND PELVIS W[DATE] [DATE]
RESULT:     Indication: Abdominal pain
TECHNIQUE: Multiple axial images from the lung bases to the symphysis pubis
were obtained with oral and without intravenous contrast.

[Series 2: abdomen · axial · 0.65mm/px · z∈[-355,+30]mm · 15 of 85 slices shown, 19 images]
[im 4/85  soft-tissue]
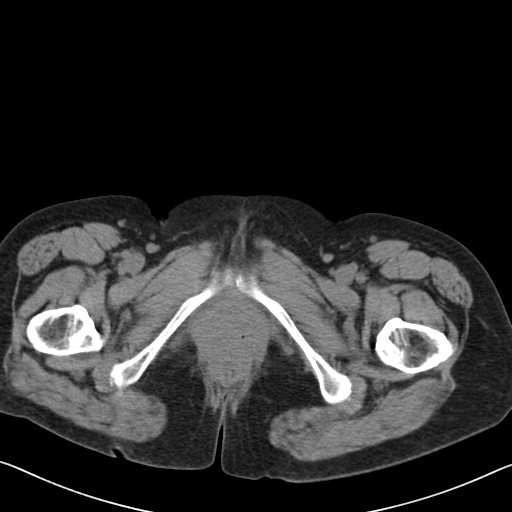
[im 4/85  bone]
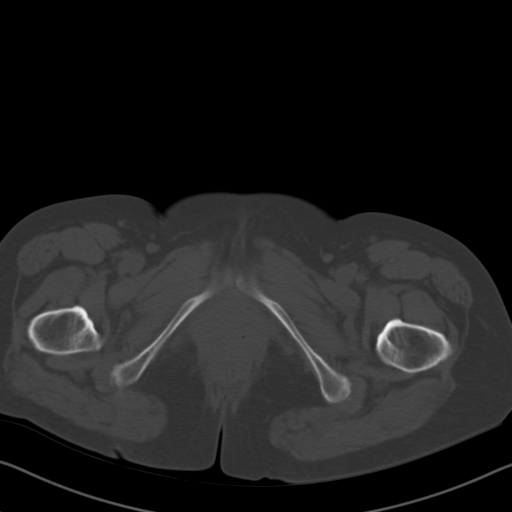
[im 11/85  soft-tissue]
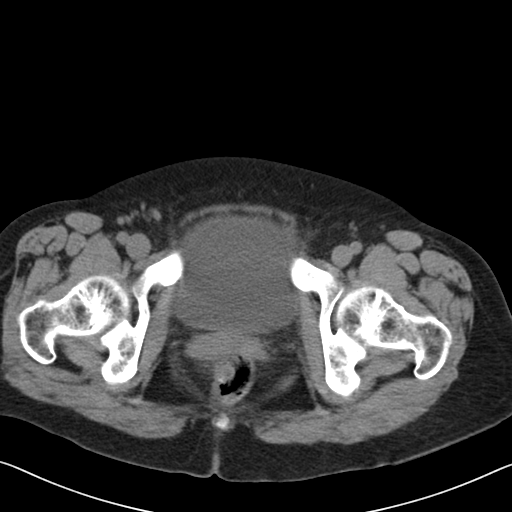
[im 17/85  soft-tissue]
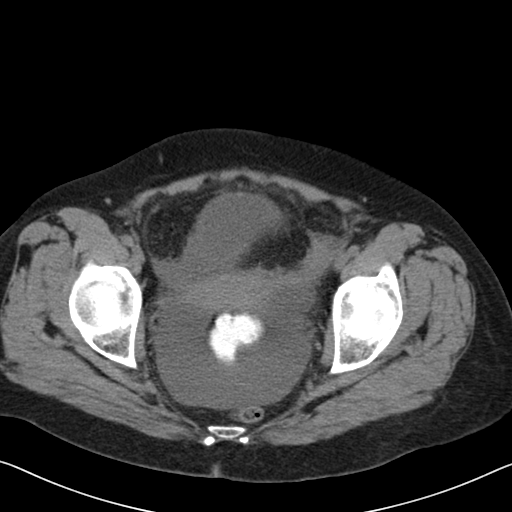
[im 24/85  soft-tissue]
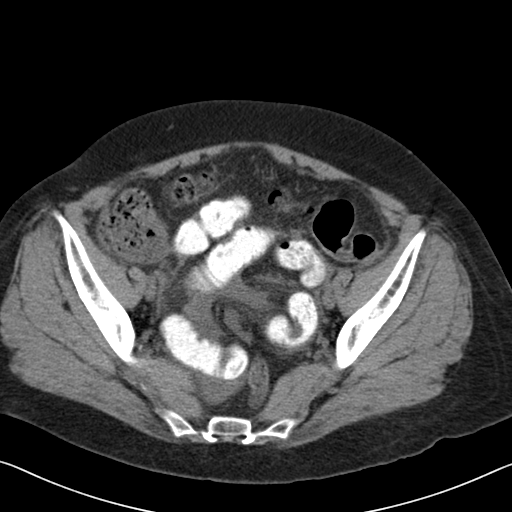
[im 31/85  soft-tissue]
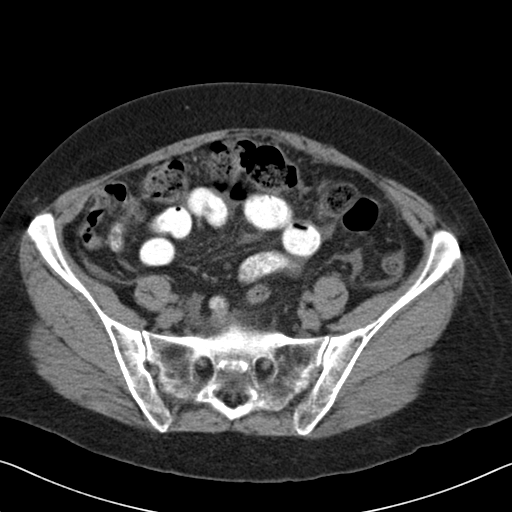
[im 37/85  soft-tissue]
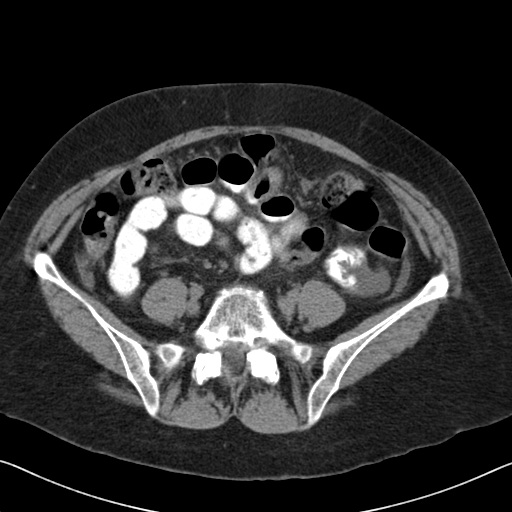
[im 44/85  soft-tissue]
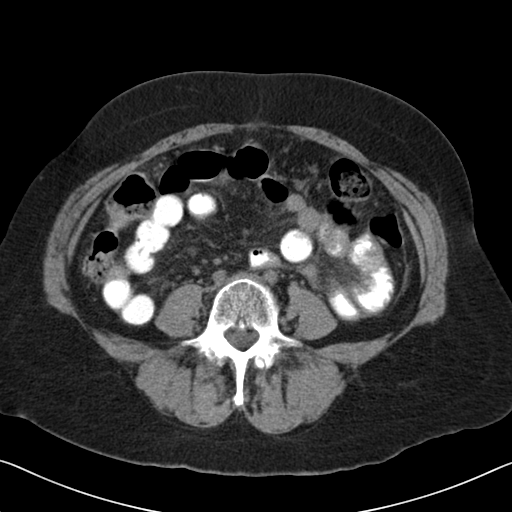
[im 48/85  soft-tissue]
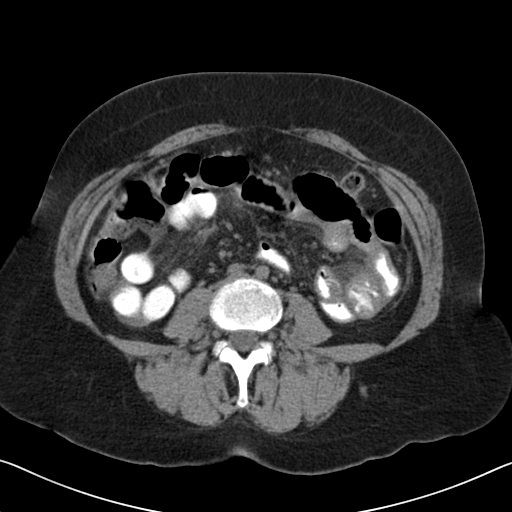
[im 54/85  soft-tissue]
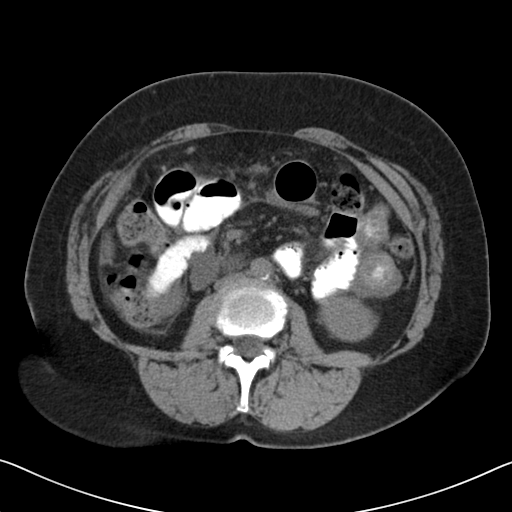
[im 54/85  bone]
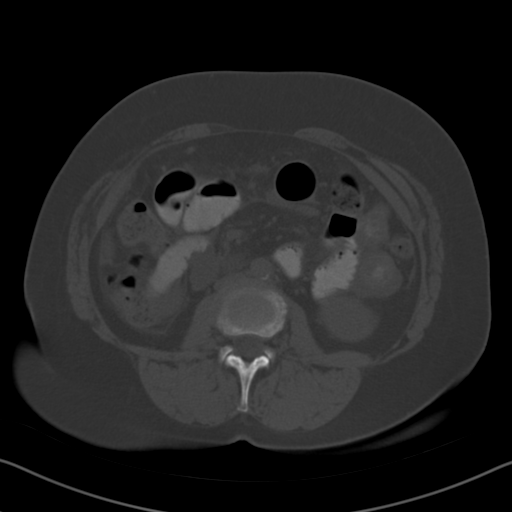
[im 61/85  soft-tissue]
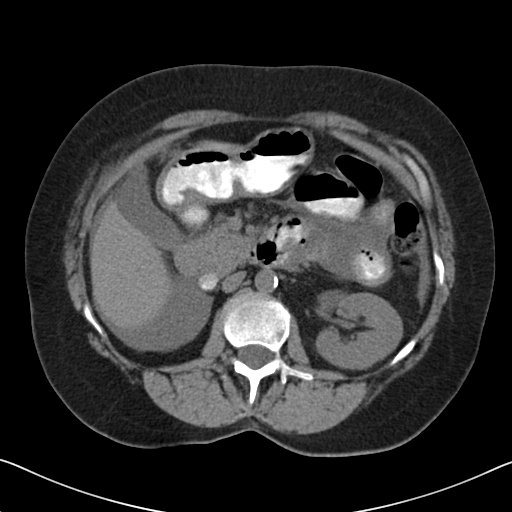
[im 68/85  soft-tissue]
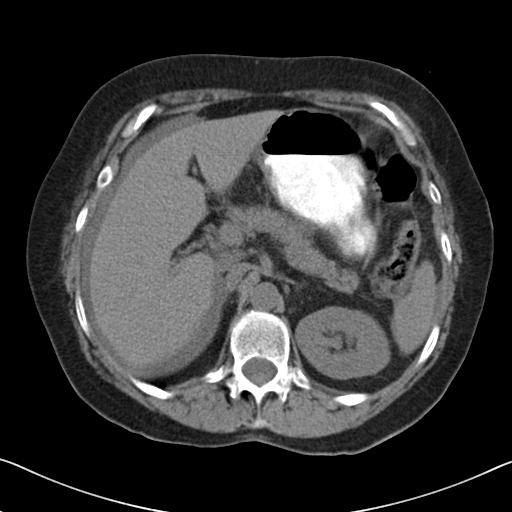
[im 71/85  lung]
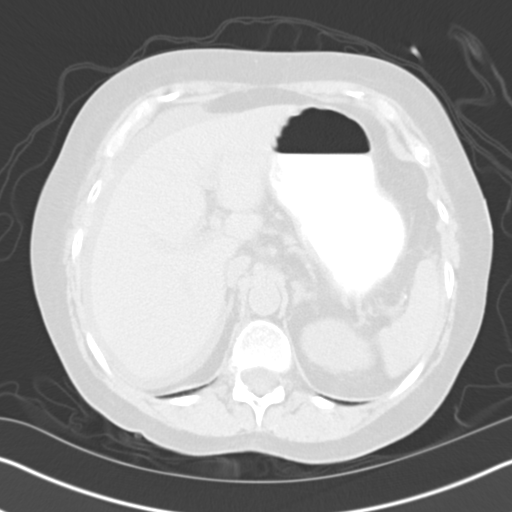
[im 74/85  soft-tissue]
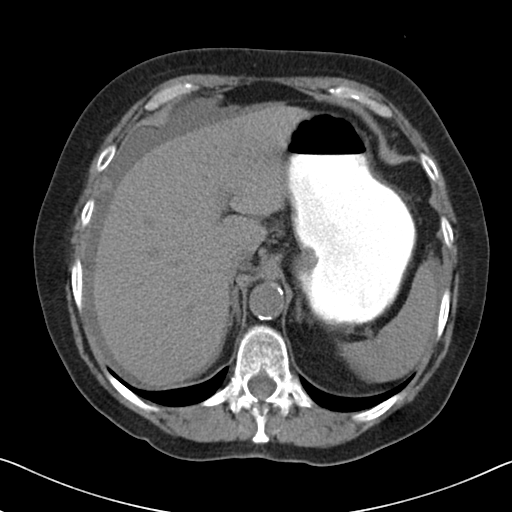
[im 74/85  lung]
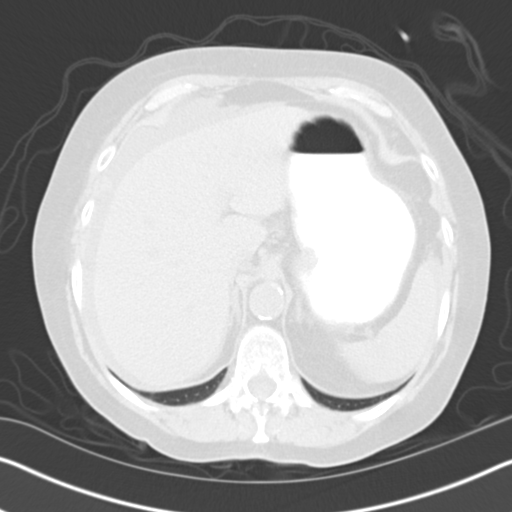
[im 78/85  lung]
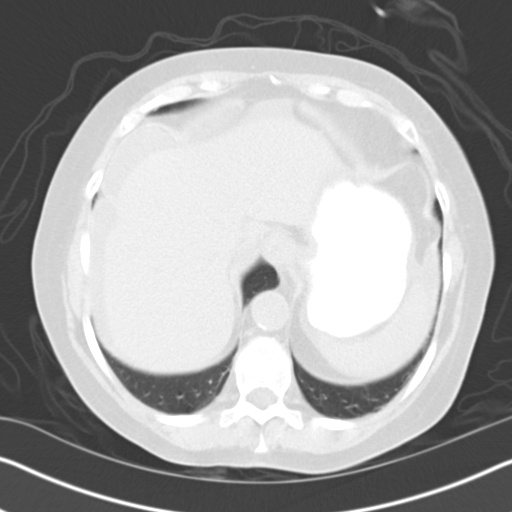
[im 81/85  soft-tissue]
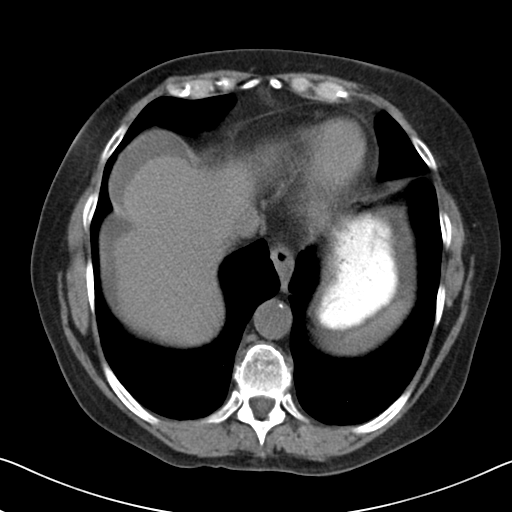
[im 81/85  lung]
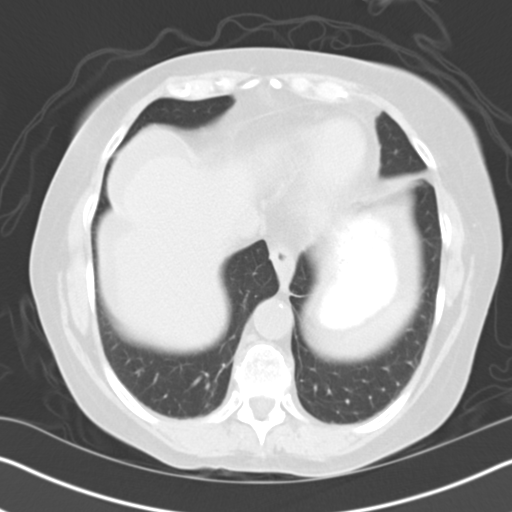

[15 of 32 positions shown; findings below may reference images not displayed]

FINDINGS: The lung bases are clear. There is no pleural or pericardial effusions.

No renal, ureteral, or bladder calculi. No obstructive uropathy. No
perinephric stranding is seen. The right kidney is surgically absent. The
left kidney is unremarkable. The bladder is unremarkable.

The liver demonstrates no focal abnormality. The gallbladder is
unremarkable. The spleen demonstrates no focal abnormality. The adrenal
glands and pancreas are normal.

There is nonspecific circumferential wall thickening of the distal
esophagus. There is a small hyperdensity in the retroperitoneum measuring 10
mm which likely represents a small duodenal diverticulum. There is bowel
wall thickening of the fourth portion of the duodenum through the mid
jejunum in the left side of the abdomen with fluid present within the left
mesentery. There is adjacent fat stranding. There is no bowel obstruction.
There is no pneumoperitoneum, pneumatosis, or portal venous gas. There is a
moderate amount of abdominal and pelvic free fluid. There is perihepatic
ascites. There is no lymphadenopathy.

The abdominal aorta is normal in caliber.

The osseous structures are unremarkable.
IMPRESSION: 1. There is bowel wall thickening of the fourth portion of the duodenum
through the mid jejunum in the left side of the abdomen with fluid present
within the left mesentery. There is adjacent fat stranding. There is no
bowel obstruction. The overall appearance is concerning for enteritis which
may be secondary to infectious or inflammatory etiology.

2. Mild ascites.

3. Nonspecific mild circumferential distal esophageal wall thickening.

## 2009-11-03 IMAGING — CR DG CHEST 2V
1 series · 2 of 2 positions shown · non-contrast
Comparison: none

REASON FOR EXAM: weak
COMMENTS:

PROCEDURE:     DXR - DXR CHEST PA (OR AP) AND LATERAL  - November 04, 2008 [DATE]
RESULT:     Comparison: 06/04/2008

[Series 1: view not recorded · 0.17mm/px · 2 of 2 slices shown]
[im 1/2]
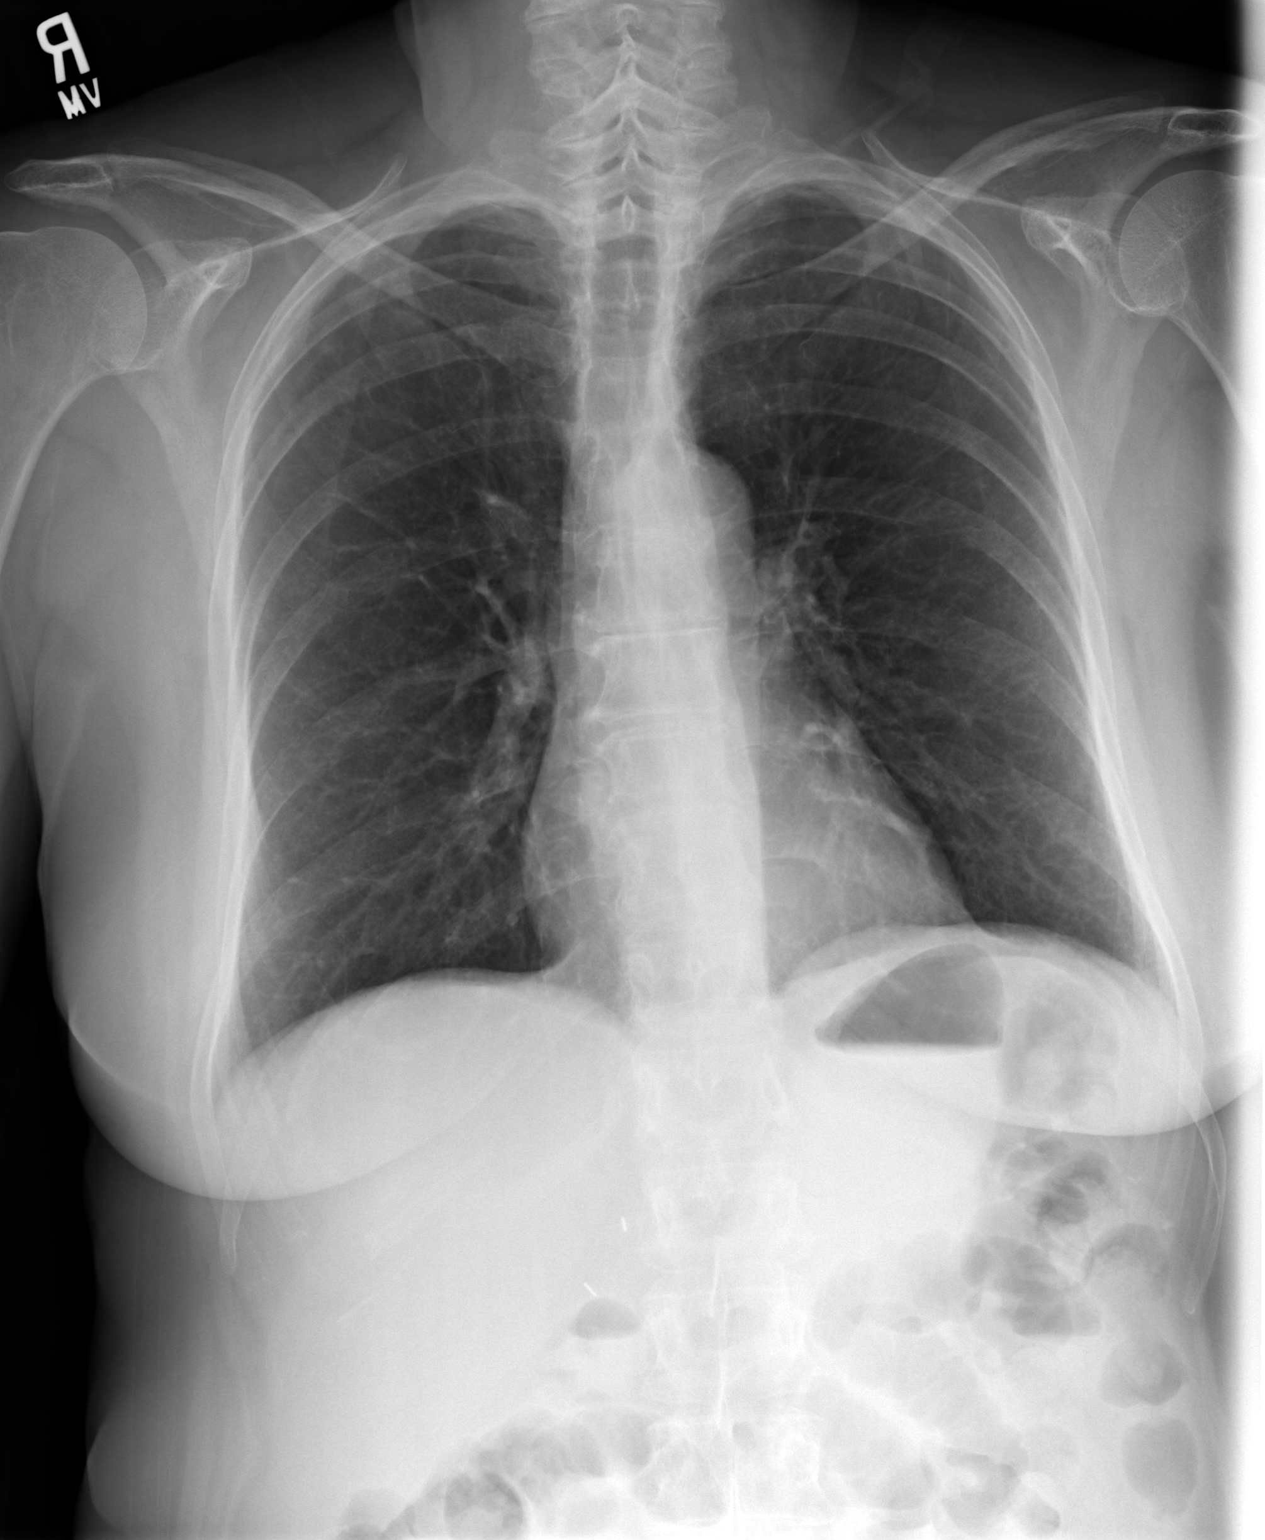
[im 2/2]
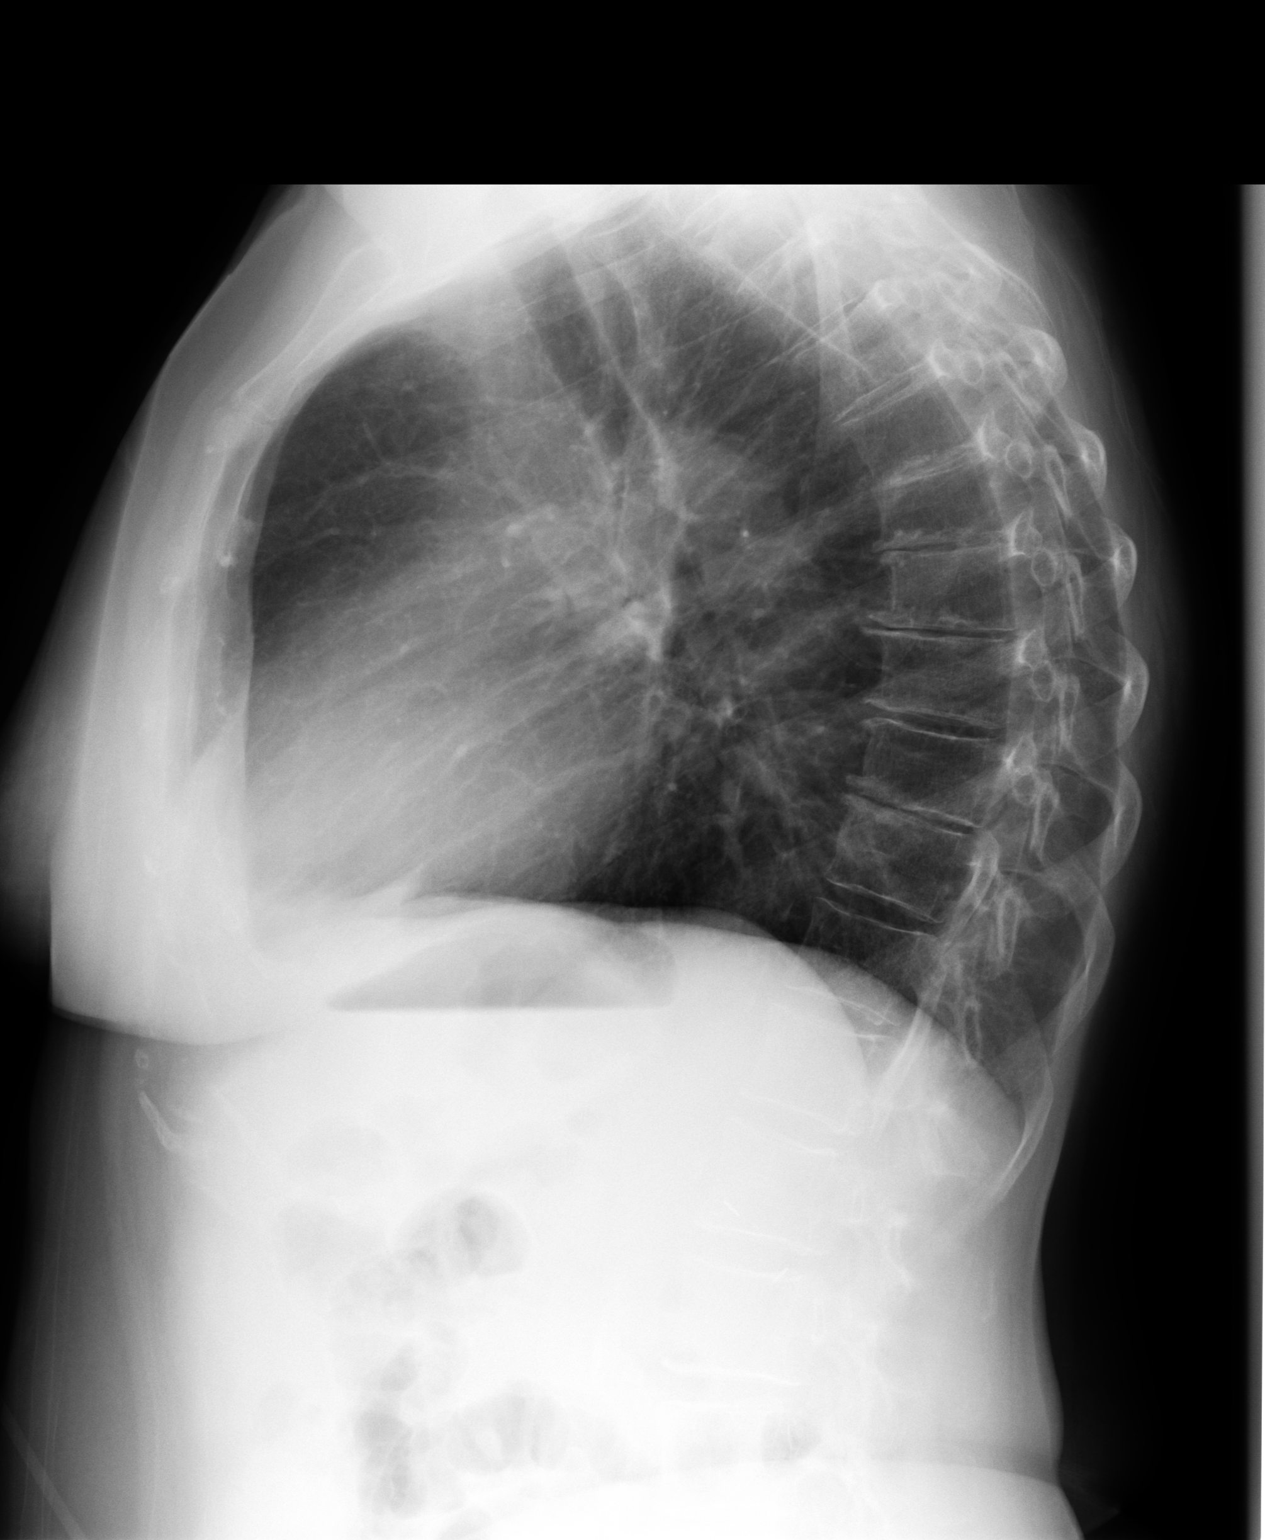

[2 of 2 positions shown; findings below may reference images not displayed]

FINDINGS: PA and lateral chest radiographs are provided. The lungs are hyperinflated
likely secondary to COPD. There is a 4 mm pulmonary nodule at the right lung
apex. There is no focal parenchymal opacity, pleural effusion, or
pneumothorax. The heart and mediastinum are unremarkable. The osseous
structures are unremarkable.
IMPRESSION: No acute disease of the chest.

There is a 4 mm pulmonary nodule at the right lung apex. Recommend followup
chest CT in 3 months without intravenous contrast.

## 2009-11-05 IMAGING — CT CT ABD-PELV W/ CM
1 of 3 series · 13 of 32 positions shown, 19 images · non-contrast
Comparison: none

REASON FOR EXAM: (1) bowel thickening; (2) bowel thickening
COMMENTS:

[Series 5: appendicitis · axial · 0.61mm/px · z∈[-1047,-696]mm · 13 of 137 slices shown, 19 images]
[im 10/137  soft-tissue]
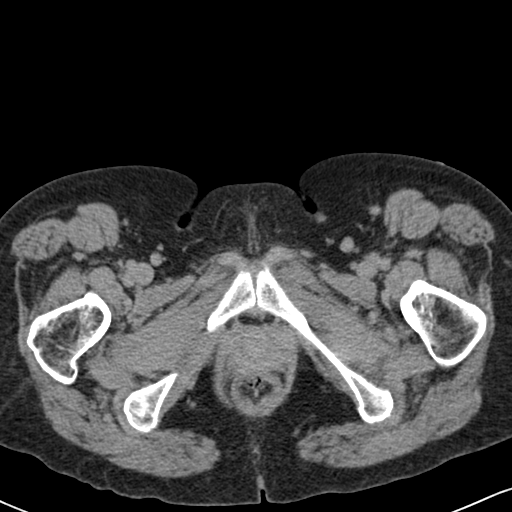
[im 10/137  bone]
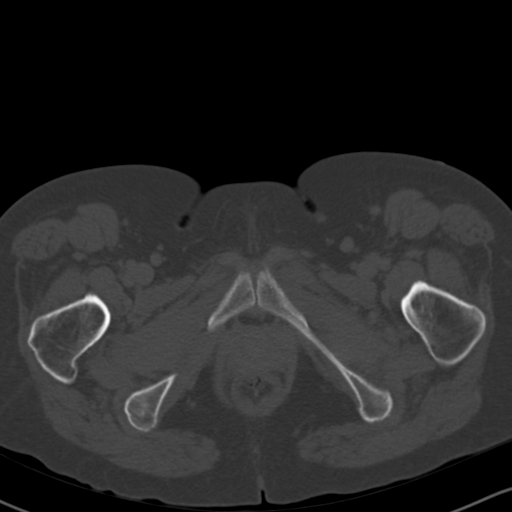
[im 20/137  soft-tissue]
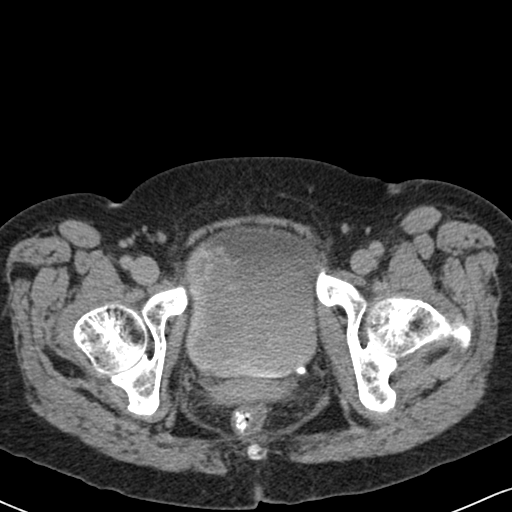
[im 30/137  soft-tissue]
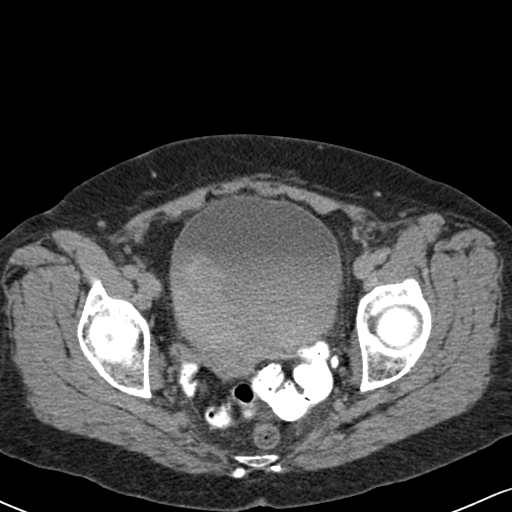
[im 39/137  soft-tissue]
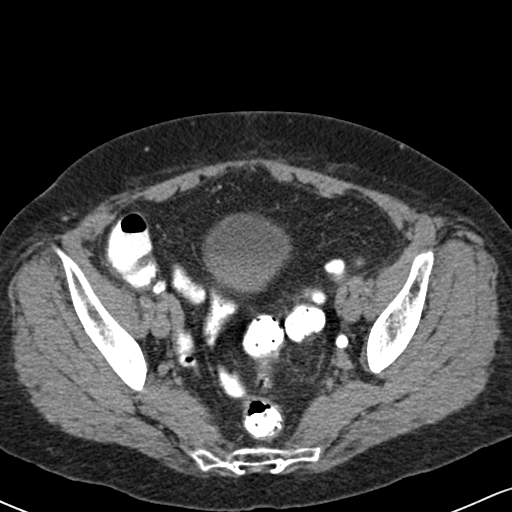
[im 49/137  soft-tissue]
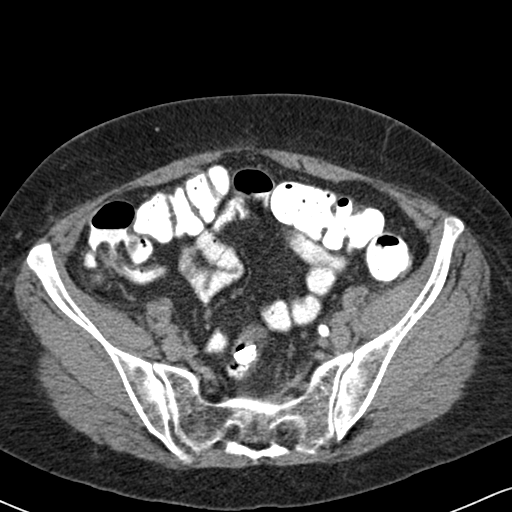
[im 59/137  soft-tissue]
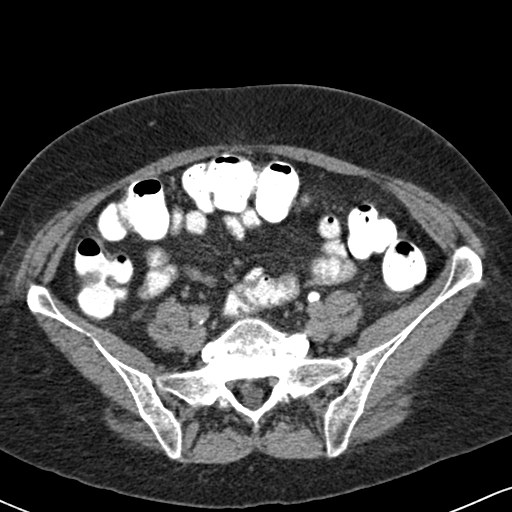
[im 69/137  soft-tissue]
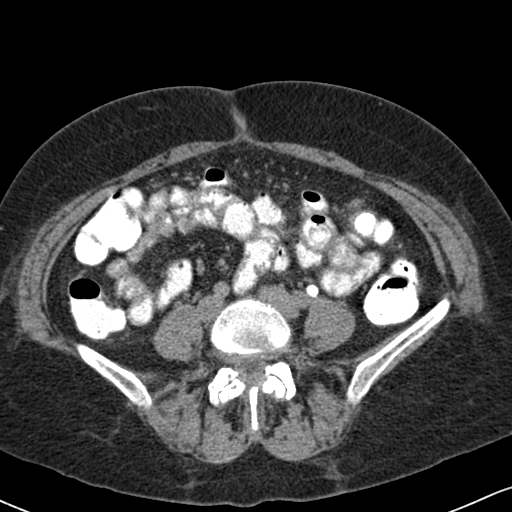
[im 78/137  soft-tissue]
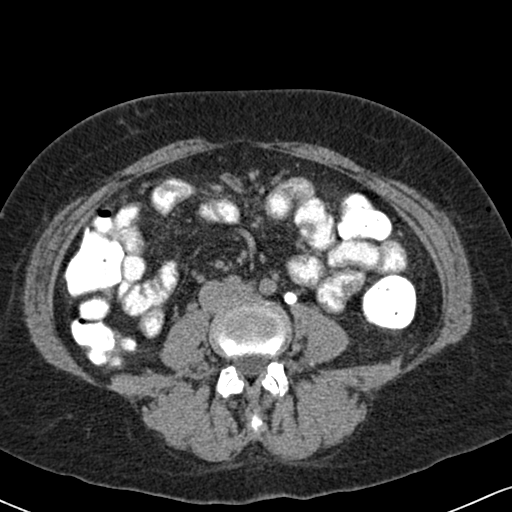
[im 88/137  soft-tissue]
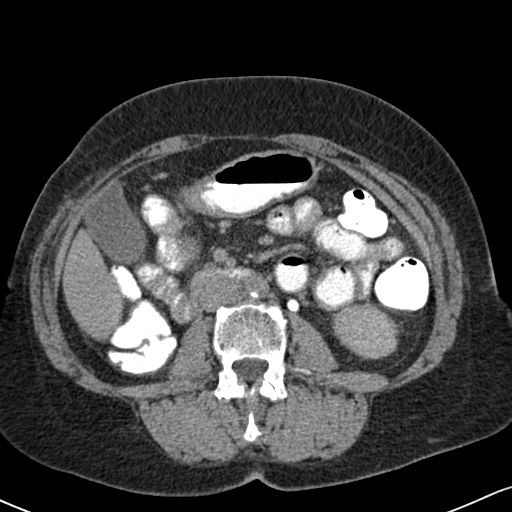
[im 88/137  bone]
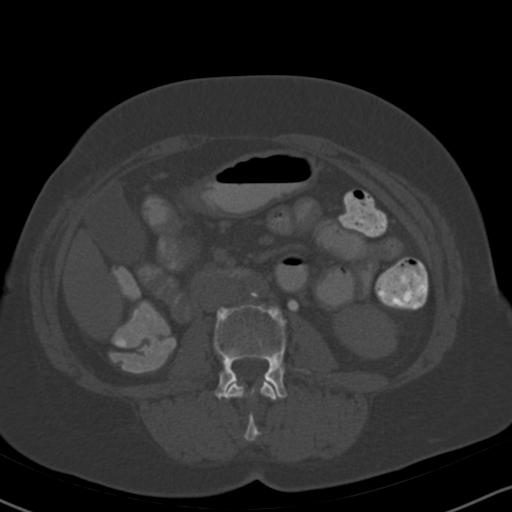
[im 98/137  soft-tissue]
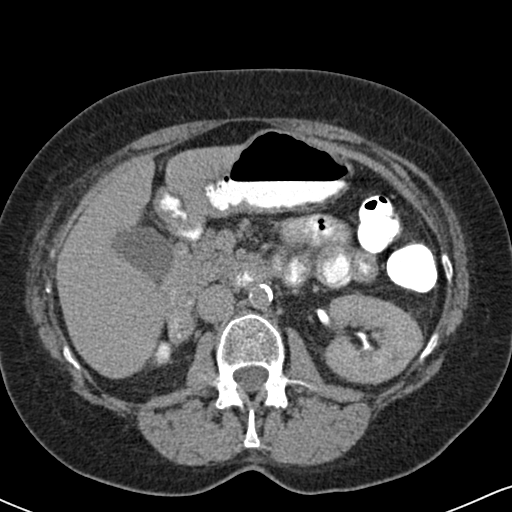
[im 98/137  lung]
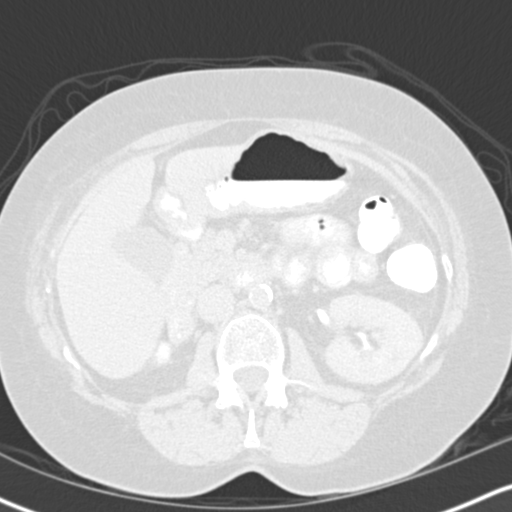
[im 107/137  soft-tissue]
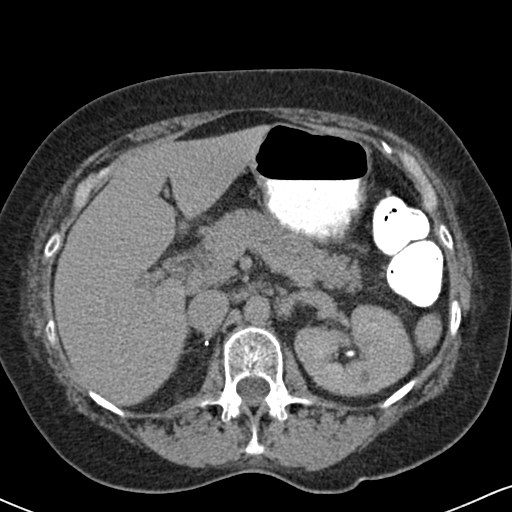
[im 107/137  lung]
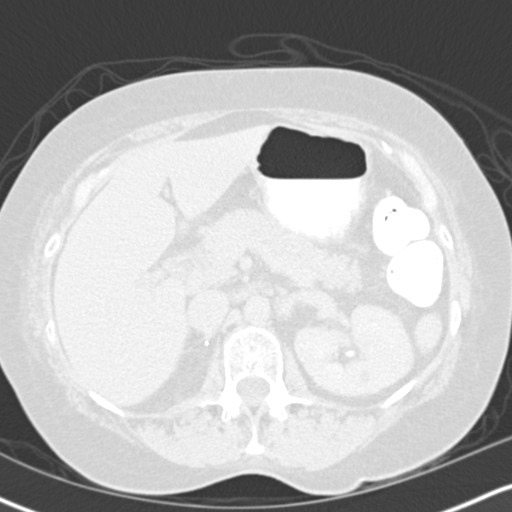
[im 117/137  soft-tissue]
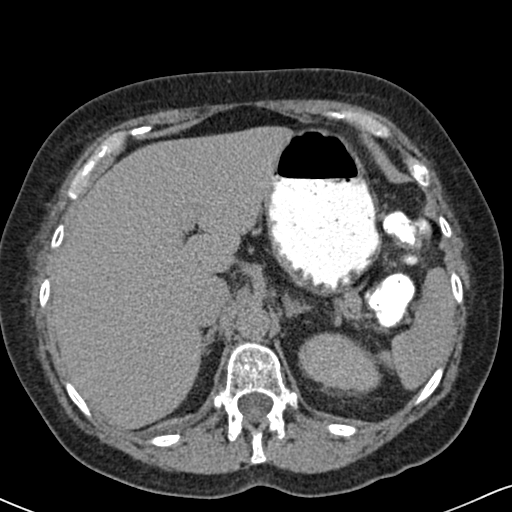
[im 117/137  lung]
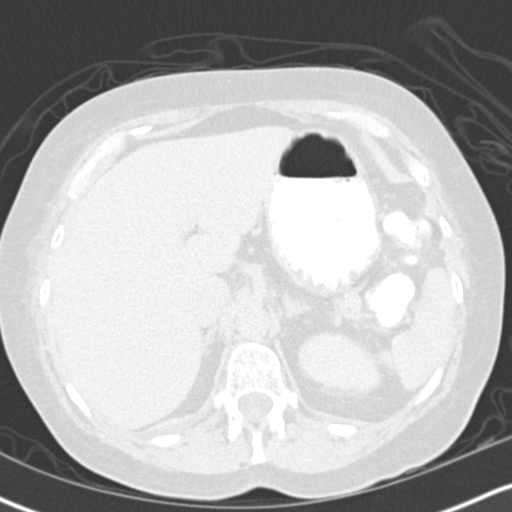
[im 127/137  soft-tissue]
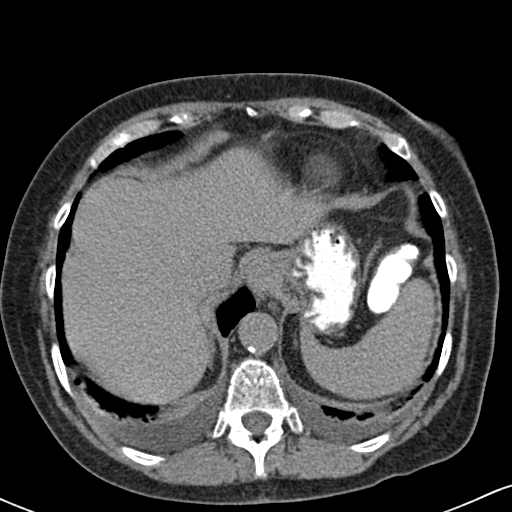
[im 127/137  lung]
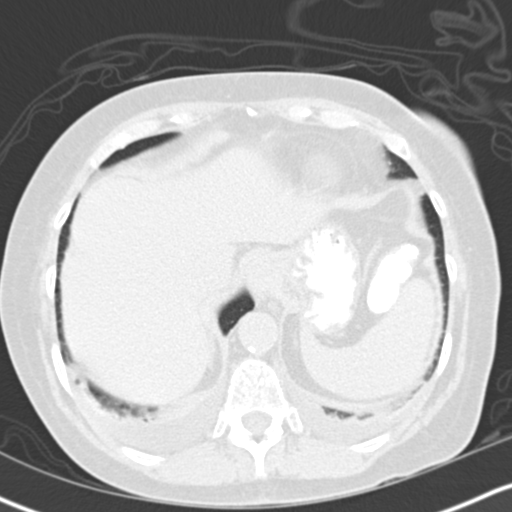

[13 of 32 positions shown; findings below may reference images not displayed]

PROCEDURE:     CT  - CT ABDOMEN / PELVIS  W  - November 06, 2008 [DATE]

RESULT:     Comparison is made to prior study dated 11/03/2008.

Helical 3 mm sections were obtained from the lung bases through the pubic
symphysis status post intravenous administration of 75 ml Isovue 300 and
oral contrast.

Evaluation of the lung bases demonstrates trace bilateral effusions.
Emphysematous changes are identified within the lung bases as well as a
component of interstitial changes.

The liver, spleen, adrenals and pancreas are unremarkable.

The right kidney is absent. Surgical clips are identified in the right renal
fossa. These findings are consistent with surgical removal. The left kidney
demonstrates small, subcentimeter low attenuating foci within the kidney,
the cortical portion of the kidney likely represent small cysts. There is no
evidence of hydronephrosis, further masses nor gross evidence of calculi.
Vascular calcifications are appreciated within the renal artery. There is no
CT evidence of bowel obstruction.

Prominence of the distal esophagus is once again appreciated unchanged when
compared to the previous study. The retroperitoneal area of increased
density along the second portion of the duodenum is again identified and
again likely represents a duodenal diverticulum. The previously described
stranding within the mesenteric fat has decreased in conspicuity. The areas
of small bowel wall thickening have also decreased in conspicuity when
compared to the previous study. There is no evidence of free fluid nor
drainable loculated fluid collections. There is no CT evidence of an
abdominal aortic aneurysm.
IMPRESSION: 1. Small bilateral effusions.
2. Interval resolution of the previously described inflammatory changes
within the mesenteric fat as well as areas of bowel wall thickening. There
has also been near-complete resolution of the perihepatic ascites.

## 2010-01-30 ENCOUNTER — Ambulatory Visit: Payer: Self-pay | Admitting: Internal Medicine

## 2010-02-12 IMAGING — CT CT CHEST W/O CM
1 series · 16 of 32 positions shown, 20 images · non-contrast
Comparison: None

REASON FOR EXAM: chest nodule       allergy dye
COMMENTS:

PROCEDURE:     CT  - CT CHEST WITHOUT CONTRAST  - February 13, 2009  [DATE]
RESULT:     Indication: Chest nodule
TECHNIQUE: Multiple axial images of the chest are obtained without
intravenous contrast.

[Series 2: soft tissue · axial · 0.71mm/px · z∈[-710,-456]mm · 16 of 57 slices shown, 20 images]
[im 4/57  soft-tissue]
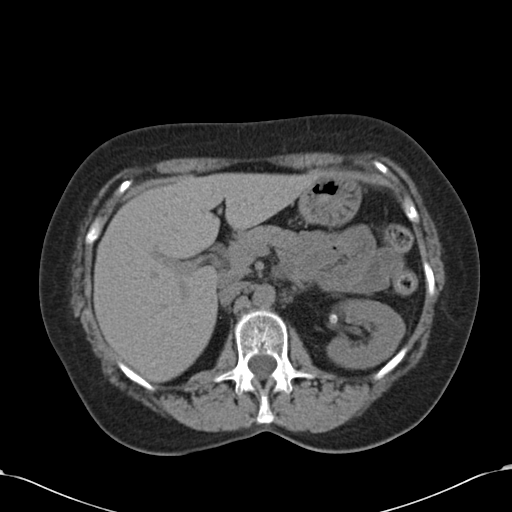
[im 4/57  bone]
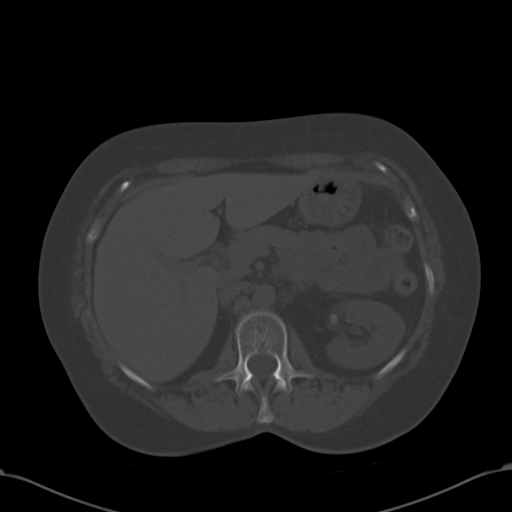
[im 8/57  soft-tissue]
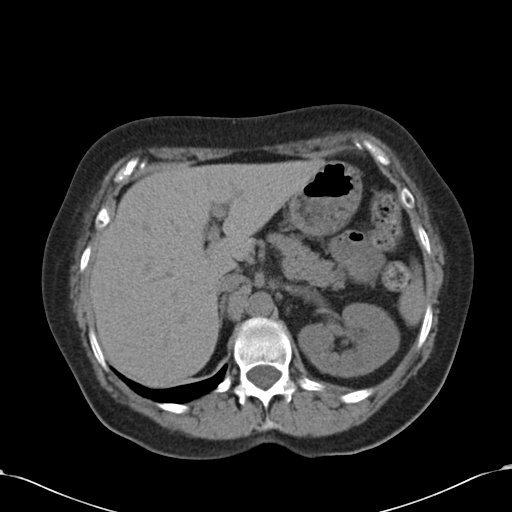
[im 11/57  soft-tissue]
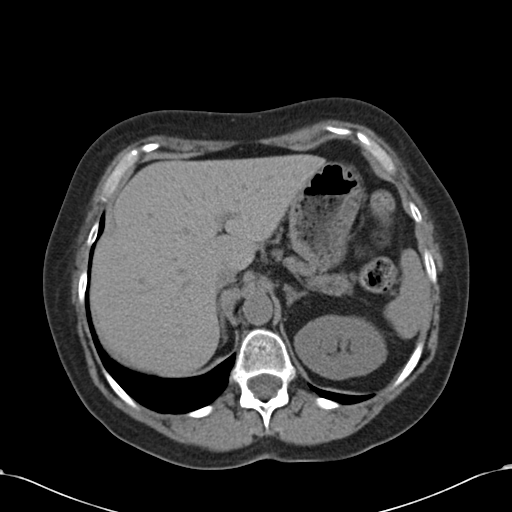
[im 15/57  soft-tissue]
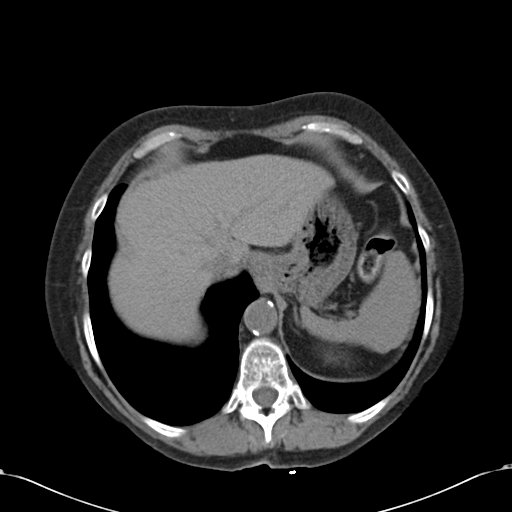
[im 19/57  soft-tissue]
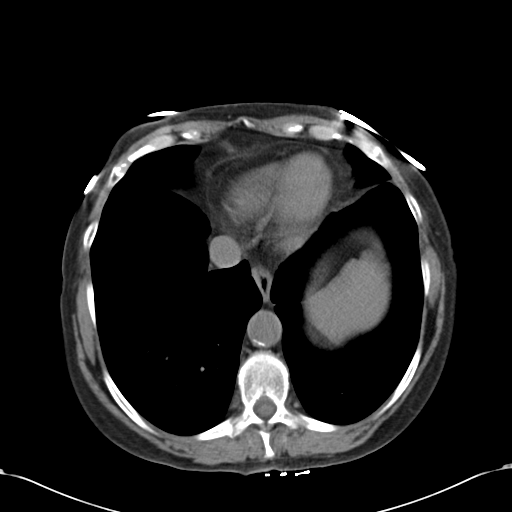
[im 22/57  soft-tissue]
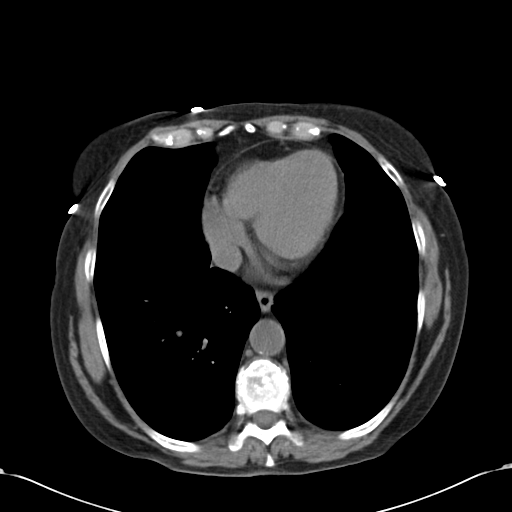
[im 26/57  soft-tissue]
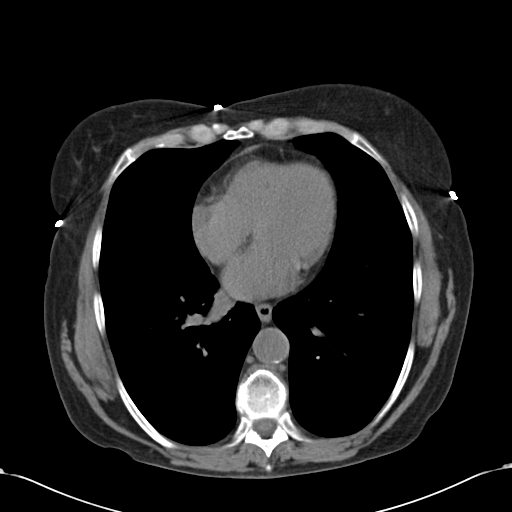
[im 31/57  soft-tissue]
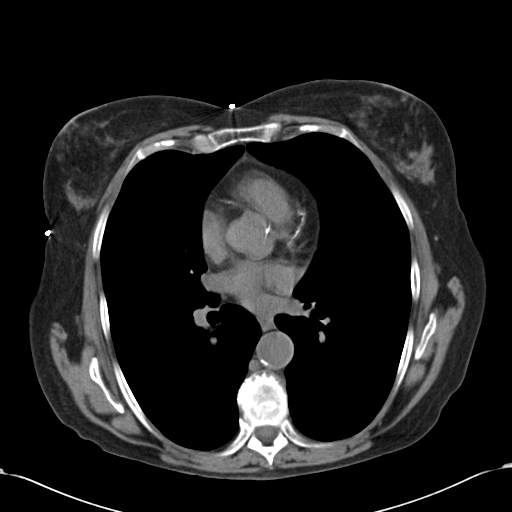
[im 35/57  soft-tissue]
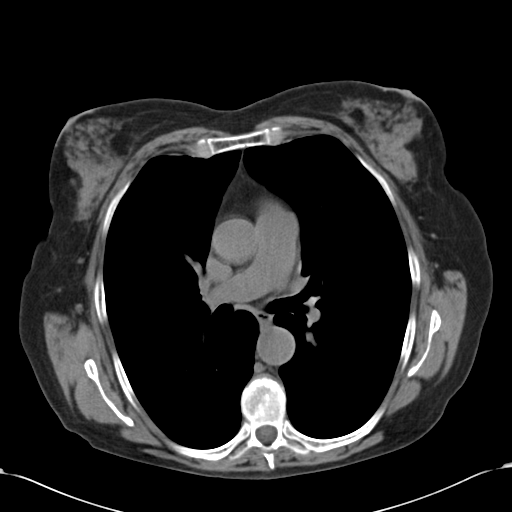
[im 35/57  bone]
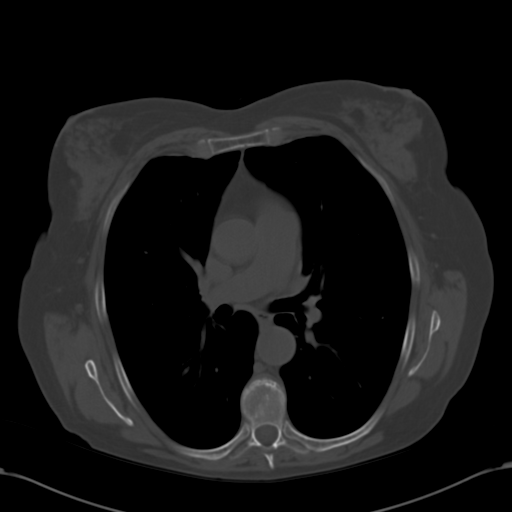
[im 38/57  soft-tissue]
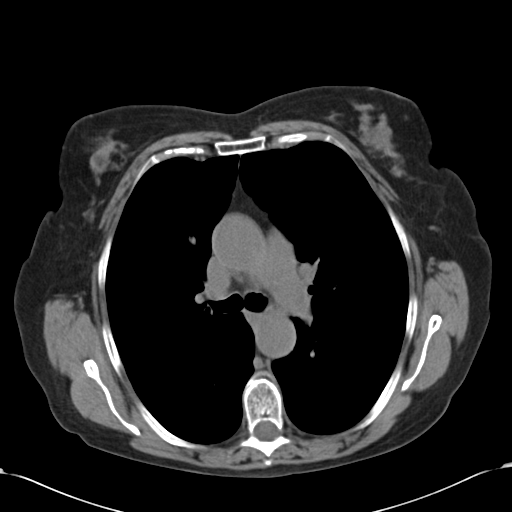
[im 42/57  soft-tissue]
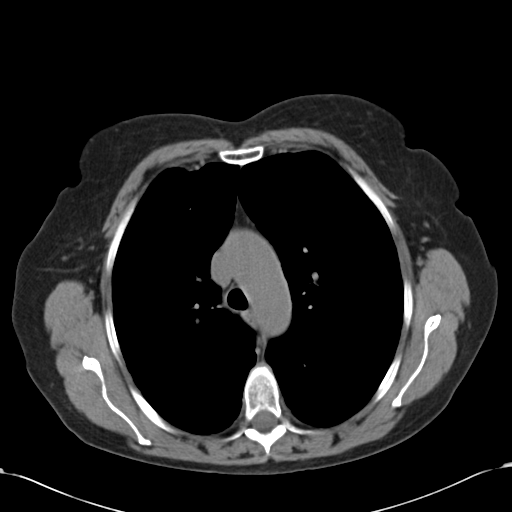
[im 46/57  soft-tissue]
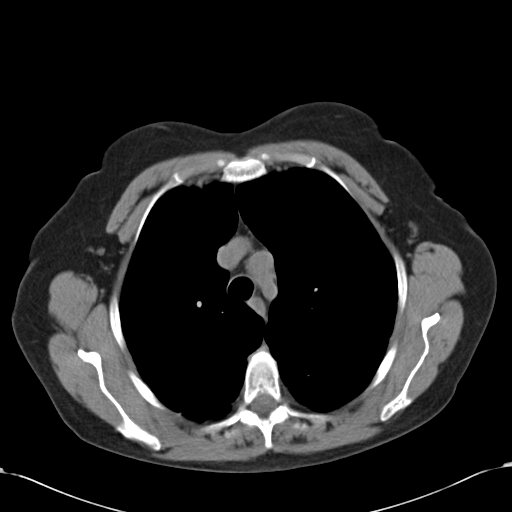
[im 49/57  soft-tissue]
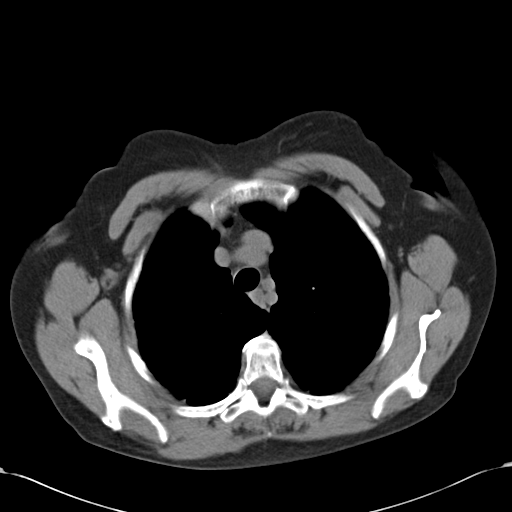
[im 49/57  lung]
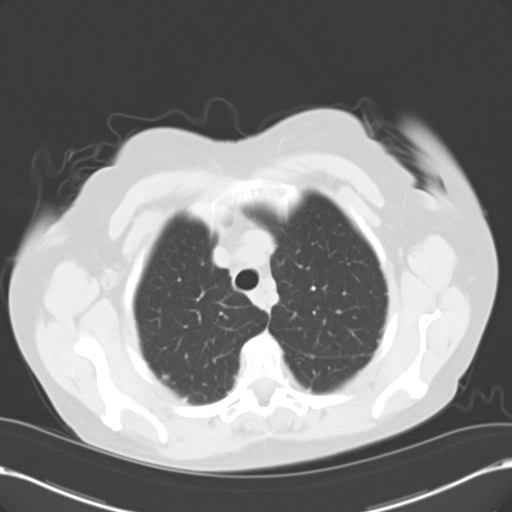
[im 51/57  lung]
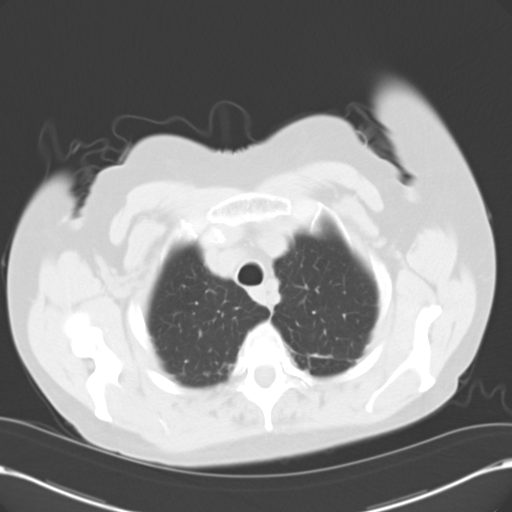
[im 53/57  soft-tissue]
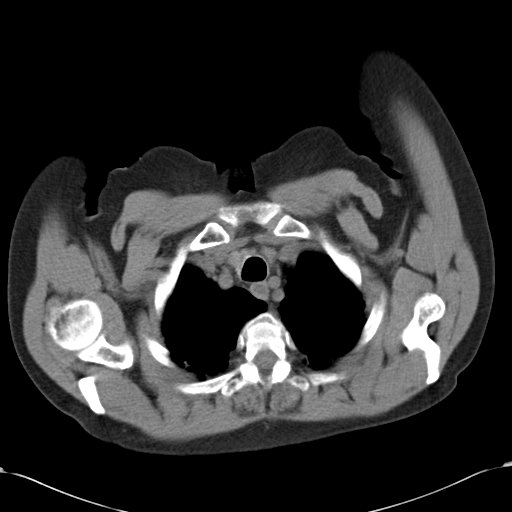
[im 53/57  lung]
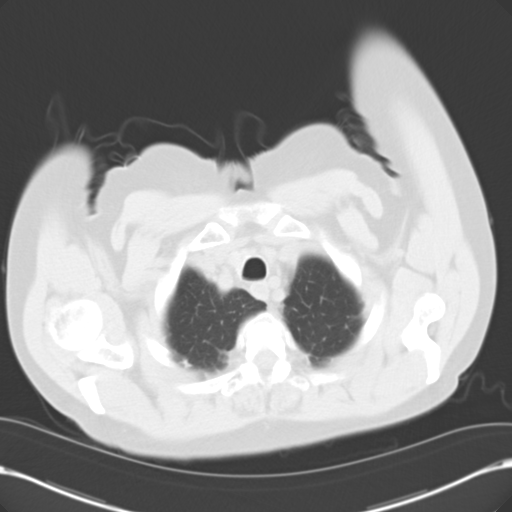
[im 55/57  lung]
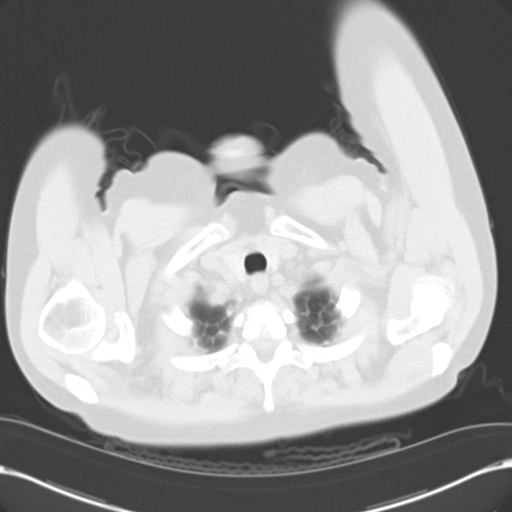

[16 of 32 positions shown; findings below may reference images not displayed]

FINDINGS: The central airways are patent. The lungs are clear. There is no focal
consolidation. There is no pleural effusion or pneumothorax.

There are no pathologically enlarged axillary, hilar, or mediastinal lymph
nodes.

The heart size is normal. There is no pericardial effusion. The thoracic
aorta is normal in caliber.  There is coronary artery atherosclerosis
involving the LAD..

Review of bone windows demonstrates no focal lytic or sclerotic lesions.

Limited noncontrast images of the upper abdomen were obtained. The adrenal
glands appear normal. The remainder of the visualized abdominal organs are
unremarkable.
IMPRESSION: No acute disease of the chest.

## 2010-07-02 IMAGING — CR DG CHEST 1V PORT
1 series · 1 of 1 positions shown · non-contrast
Comparison: none

REASON FOR EXAM: SOB
COMMENTS:

PROCEDURE:     DXR - DXR PORTABLE CHEST SINGLE VIEW  - July 03, 2009  [DATE]
RESULT:     Comparison: None

[view not recorded]
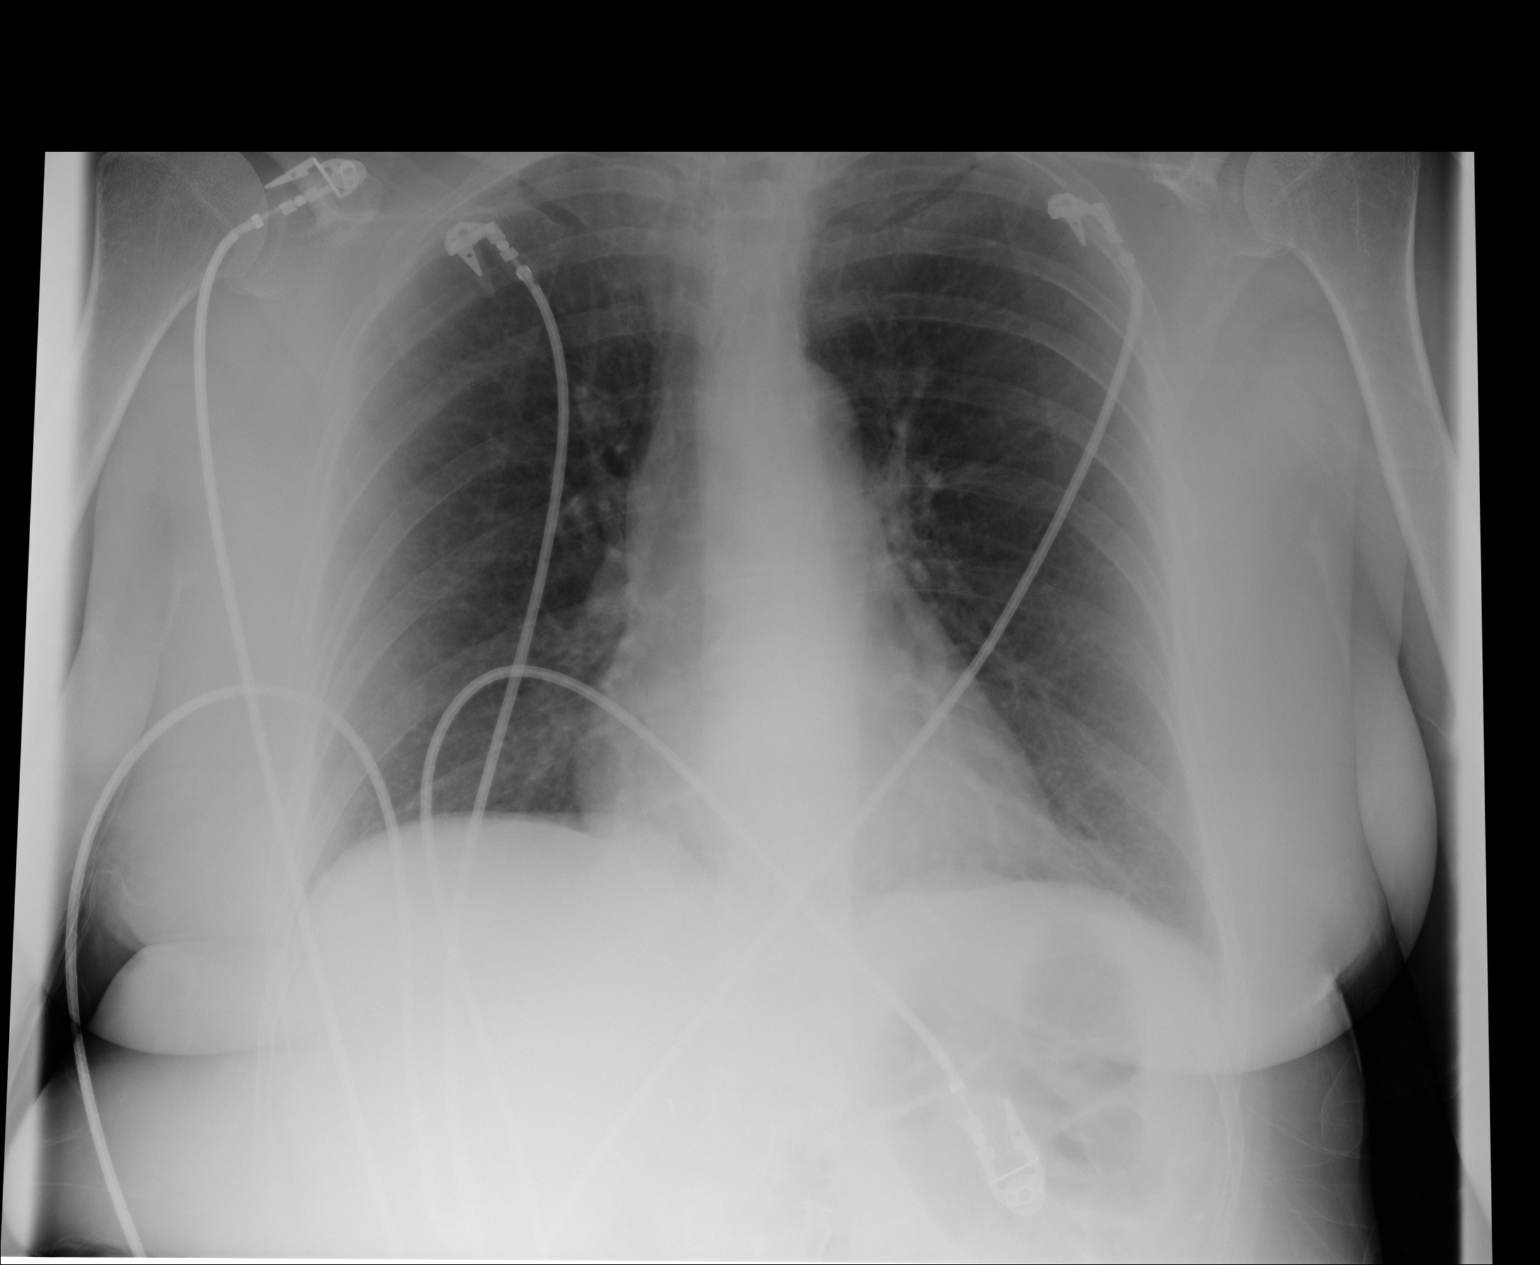

[1 of 1 positions shown; findings below may reference images not displayed]

FINDINGS: Single portable AP chest radiograph is provided. There is no focal
parenchymal opacity, pleural effusion, or pneumothorax. Normal
cardiomediastinal silhouette. The osseous structures are unremarkable.
IMPRESSION: No acute disease of the chest.

## 2010-07-02 IMAGING — NM NM LUNG SCAN
2 series · 16 of 16 positions shown · non-contrast
Comparison: None

REASON FOR EXAM: elevated D-dimer and SOB
COMMENTS:

PROCEDURE:     NM  - NM VQ LUNG SCAN  - [DATE]  [DATE] [DATE]  [DATE]
RESULT:     Procedure: Ventilation/perfusion lung scan.
INDICATION: Shortness of breath
TECHNIQUE: Standard dynamic anterior and posterior images were obtained for
the ventilation study; anterior and posterior images in varying degrees of
obliquity were obtained for the perfusion study; the ventilation and
perfusion studies were compared with a recent chest x-ray.

[Series 1000: lung ventilation · 3.30mm/px · 4 acquisitions, 8 frames shown]
[im 1/4]
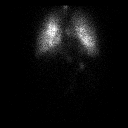
[im 1/4]
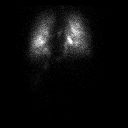
[im 2/4]
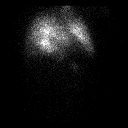
[im 2/4]
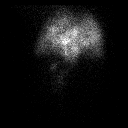
[im 3/4]
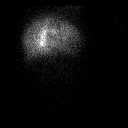
[im 3/4]
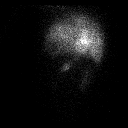
[im 4/4]
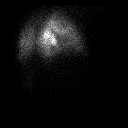
[im 4/4]
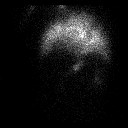

[Series 1000: lung perfusion · 1.65mm/px · 4 acquisitions, 8 frames shown]
[im 1/4]
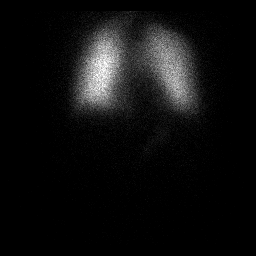
[im 1/4]
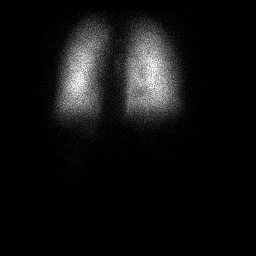
[im 2/4]
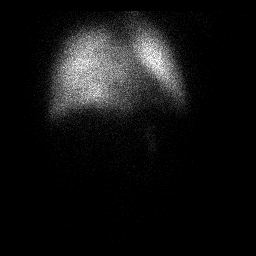
[im 2/4]
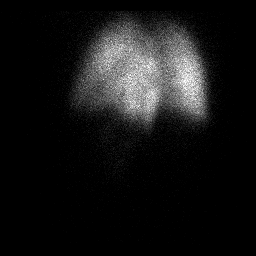
[im 3/4]
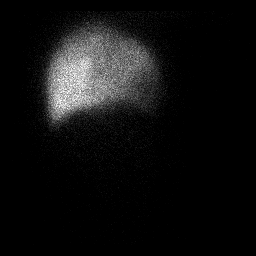
[im 3/4]
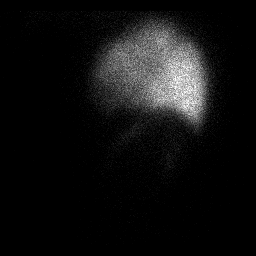
[im 4/4]
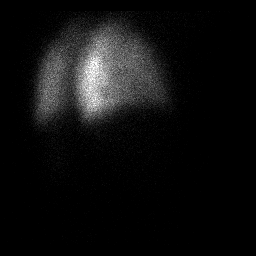
[im 4/4]
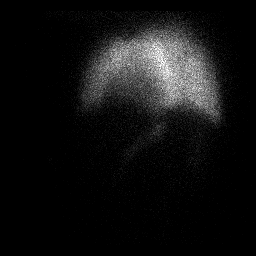

[16 of 16 positions shown; findings below may reference images not displayed]

Radiopharmaceutical: 33.1 millicuries of technetium 99m DTPA for ventilation
via a nebulizer; 4.4 mCi Ec-FFm MAA administered intravenously.
FINDINGS: AP Chest X-ray from 07/03/2009 : No focal abnormality

Ventilation: There is homogeneous ventilation. There is central deposition
of radiotracer as can be seen with lower airways disease.

Perfusion: There is homogeneous perfusion without a defects appear
IMPRESSION: Normal V/Q examination.

Central deposition of radiotracer as can be seen with lower airways disease.

## 2010-07-03 IMAGING — US US EXTREM LOW VENOUS BILAT
1 series · 17 of 24 positions shown · non-contrast
Comparison: none

REASON FOR EXAM: + D-dimer
COMMENTS:

[Series 1: us extrem low venous bilat · 17 of 44 slices shown]
[im 1/44]
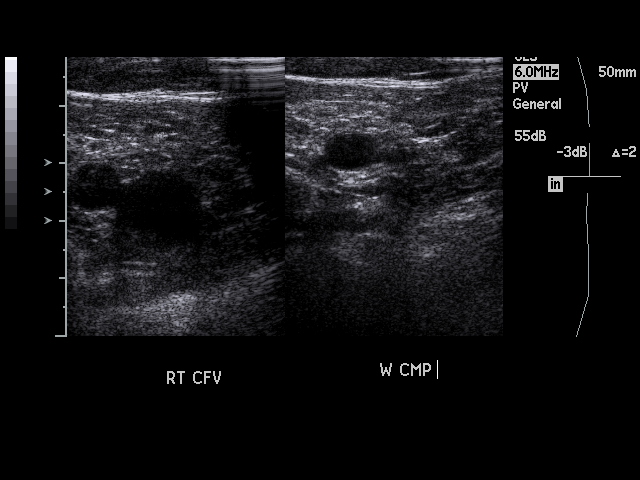
[im 4/44]
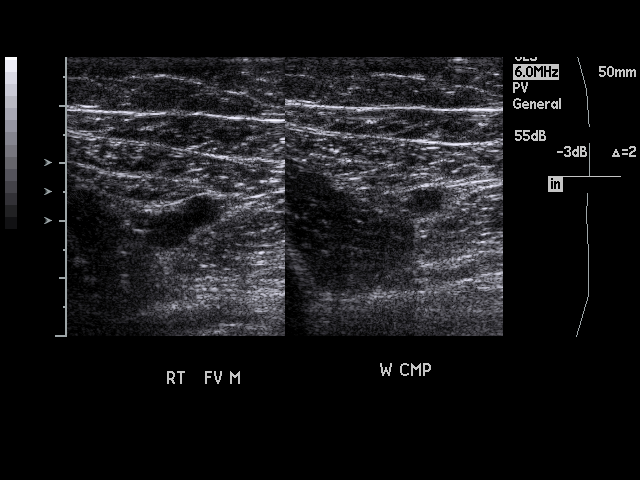
[im 6/44]
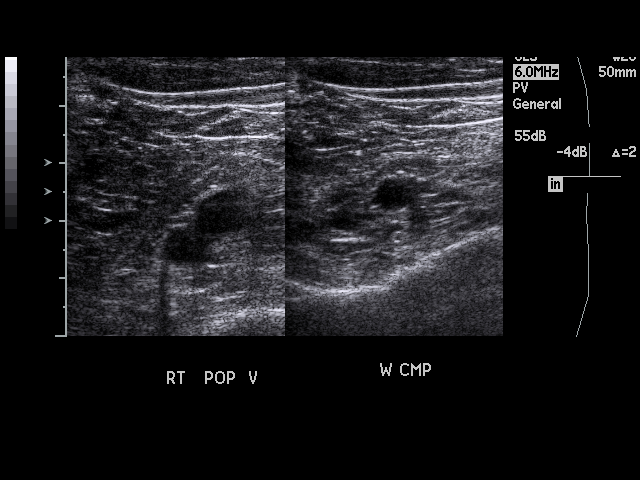
[im 8/44]
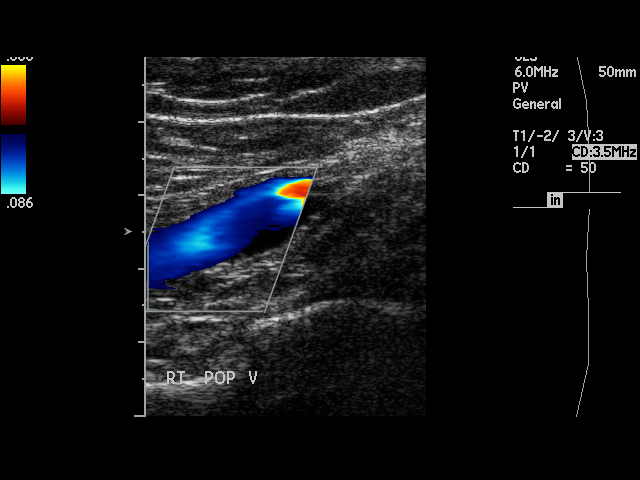
[im 12/44]
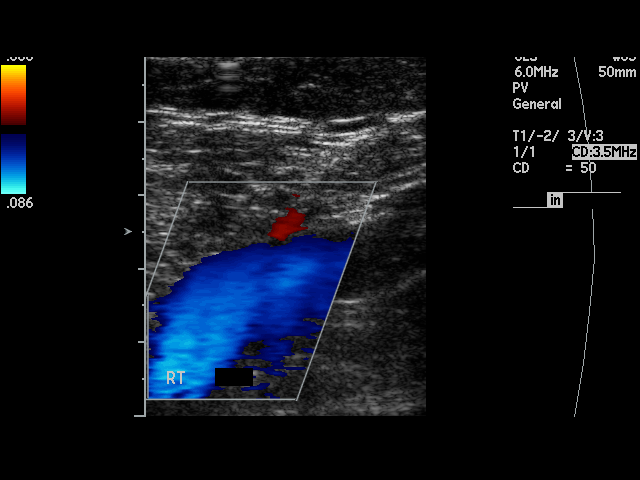
[im 14/44]
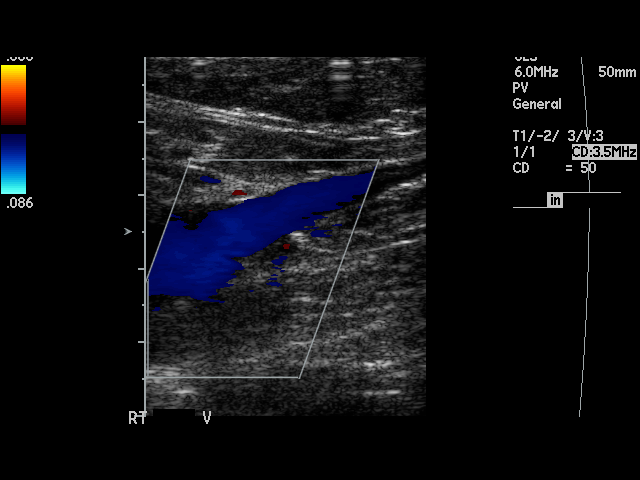
[im 17/44]
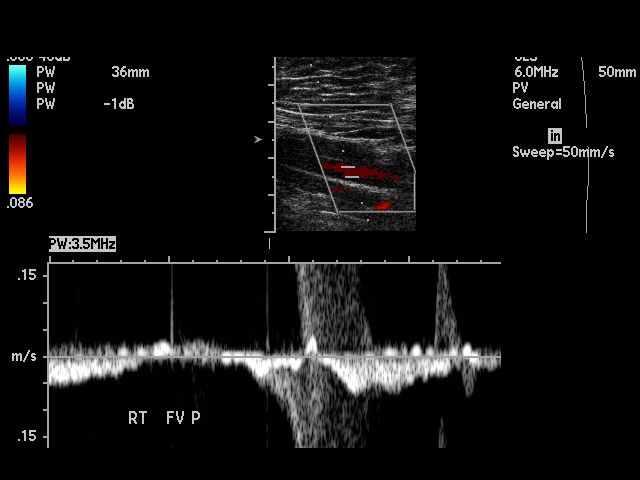
[im 19/44]
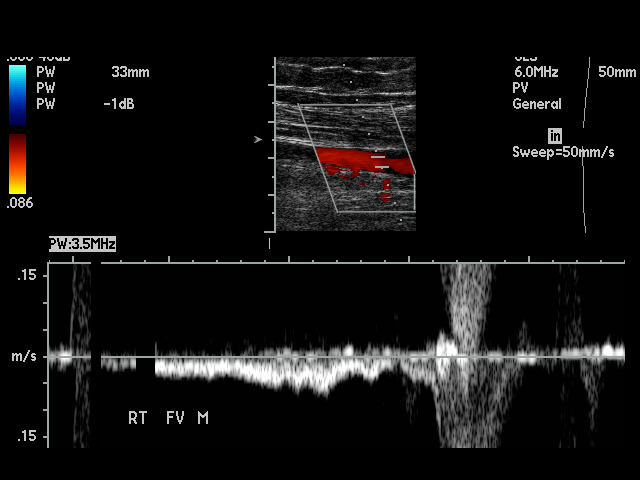
[im 23/44]
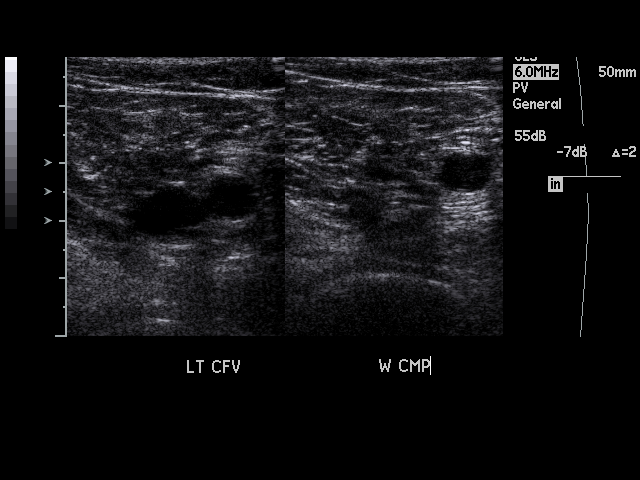
[im 25/44]
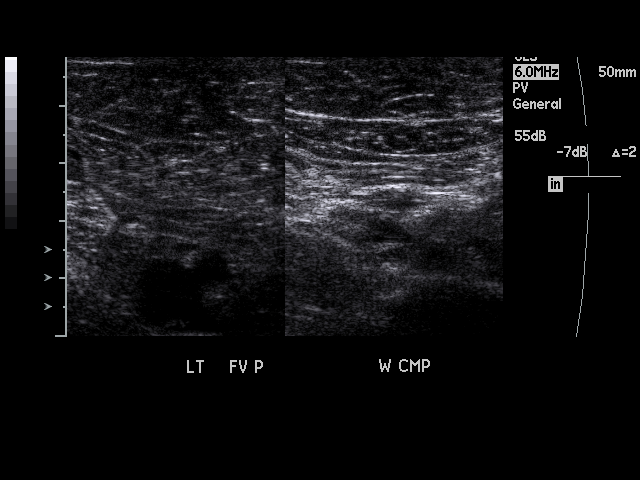
[im 27/44]
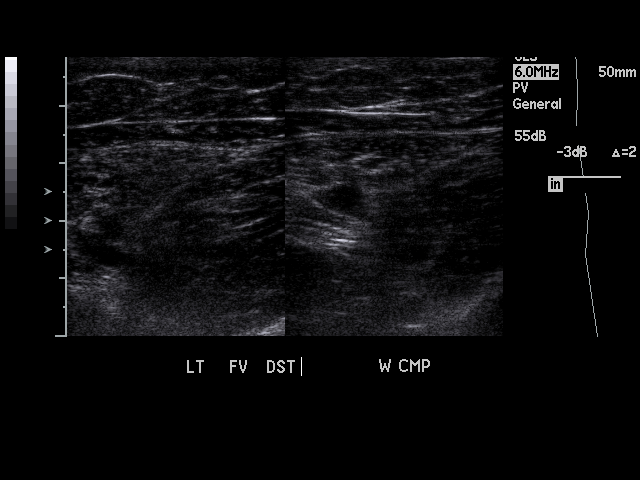
[im 30/44]
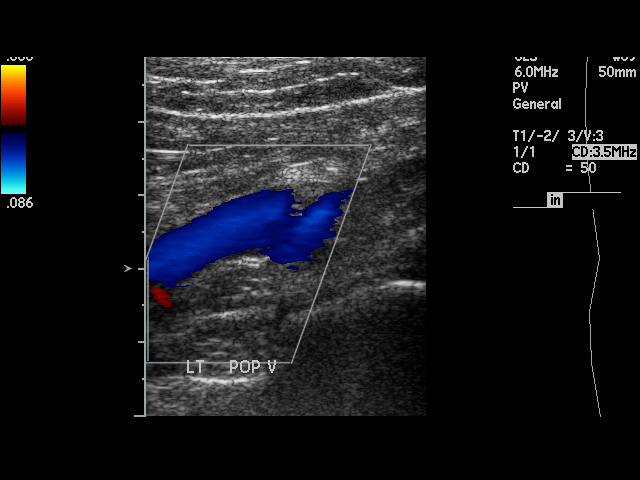
[im 32/44]
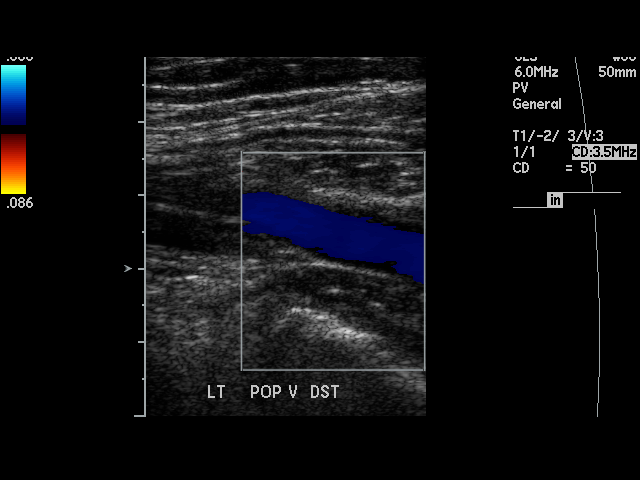
[im 36/44]
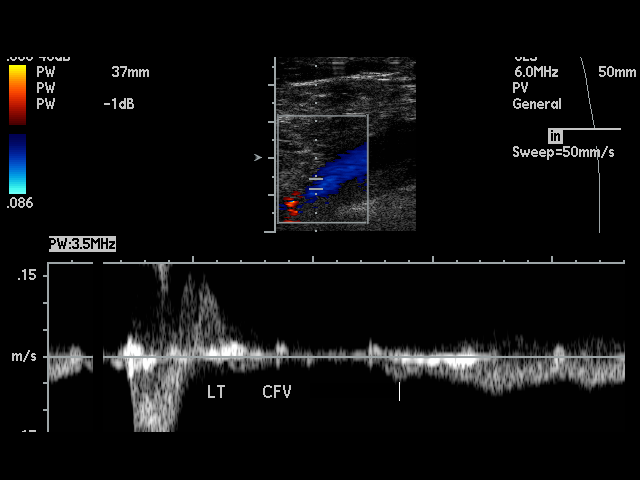
[im 38/44]
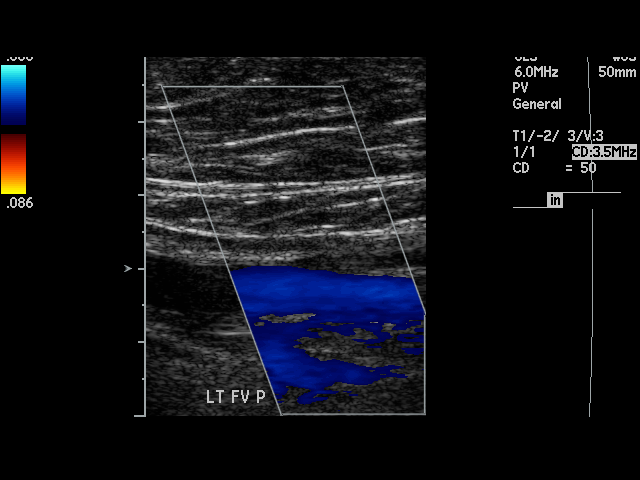
[im 40/44]
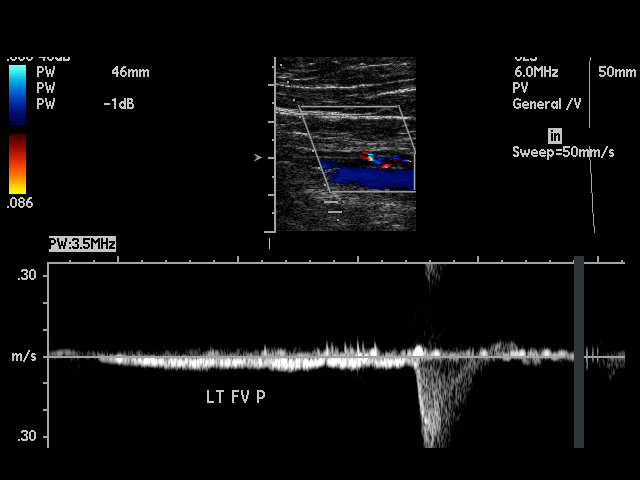
[im 44/44]
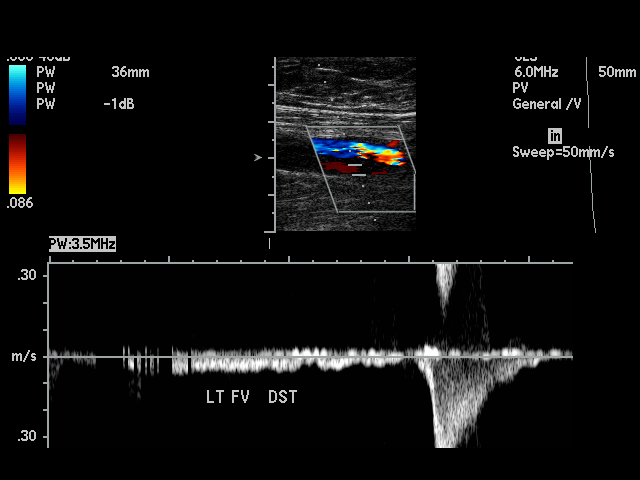

[17 of 24 positions shown; findings below may reference images not displayed]

PROCEDURE:     US  - US DOPPLER LOW EXTR BILATERAL  - July 04, 2009 [DATE]

RESULT:     The Valsalva, augmentation and phasic waveforms and normal in
appearance bilaterally. The femoral and popliteal veins bilaterally show
complete compressibility throughout their course. Bilateral Doppler
examination shows no occlusion or evidence of deep vein thrombosis.
IMPRESSION: 1.     No deep vein thrombosis is identified on either side.

## 2010-07-03 IMAGING — US US CAROTID DUPLEX BILAT
1 series · 17 of 24 positions shown · non-contrast
Comparison: none

REASON FOR EXAM: syncope
COMMENTS:

[Series 1: us carotid duplex bilat · 17 of 58 slices shown]
[im 1/58]
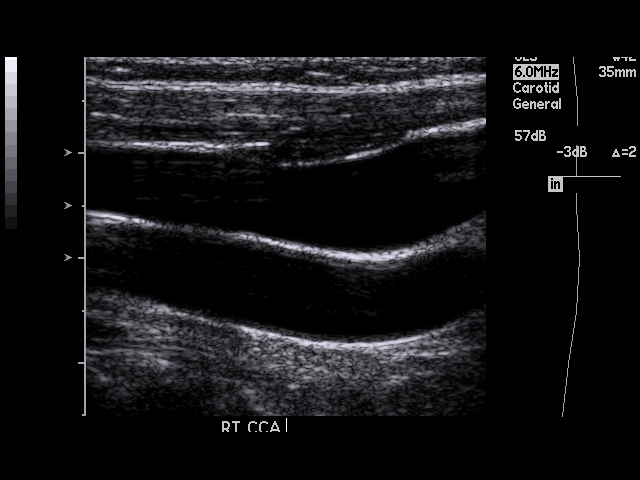
[im 5/58]
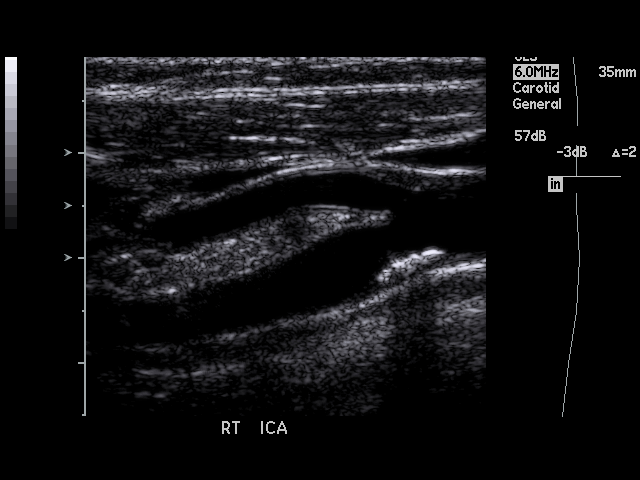
[im 8/58]
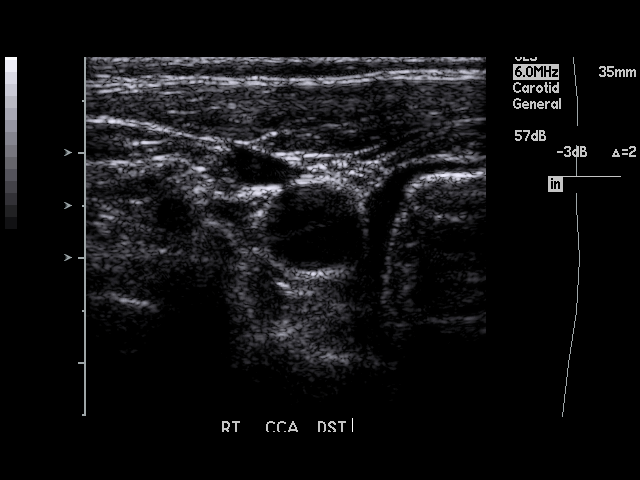
[im 10/58]
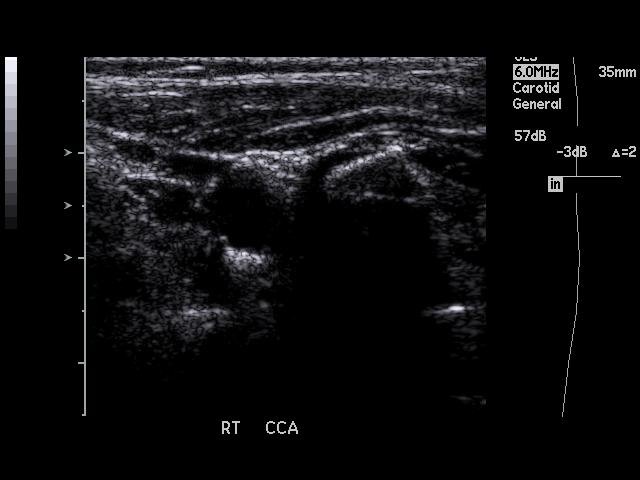
[im 15/58]
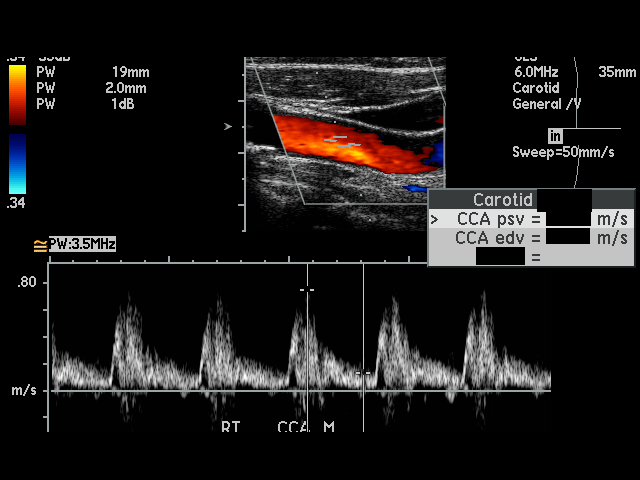
[im 18/58]
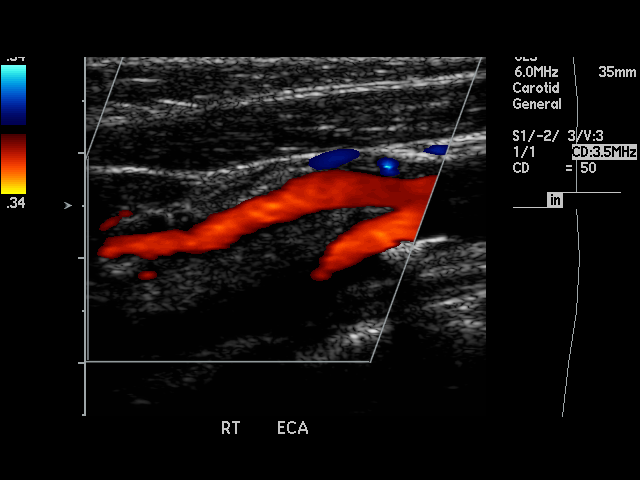
[im 23/58]
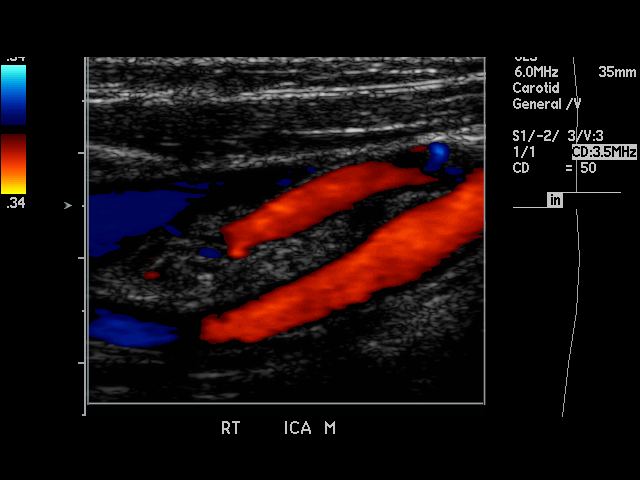
[im 25/58]
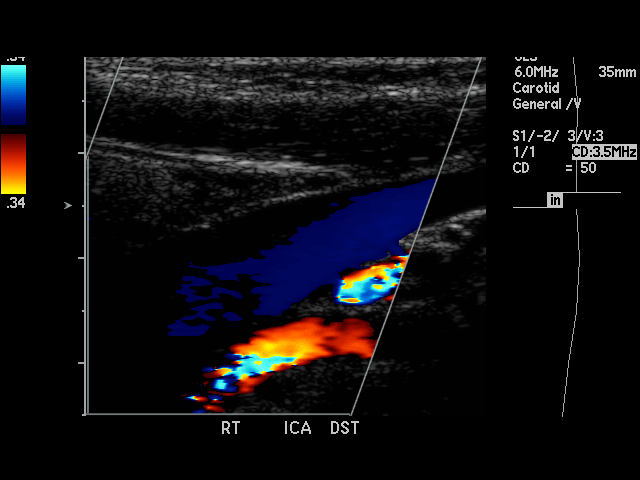
[im 30/58]
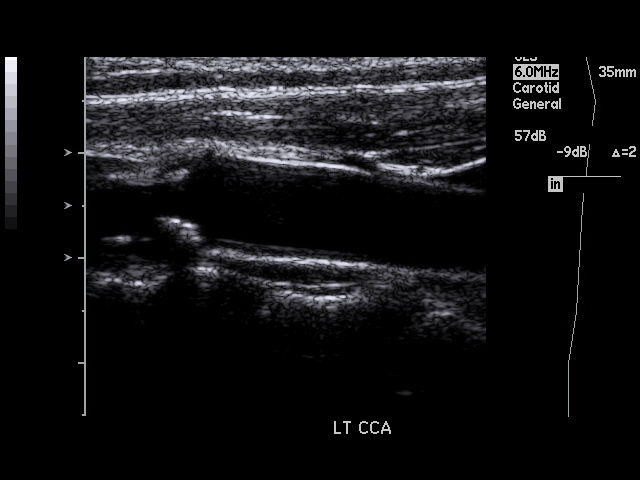
[im 33/58]
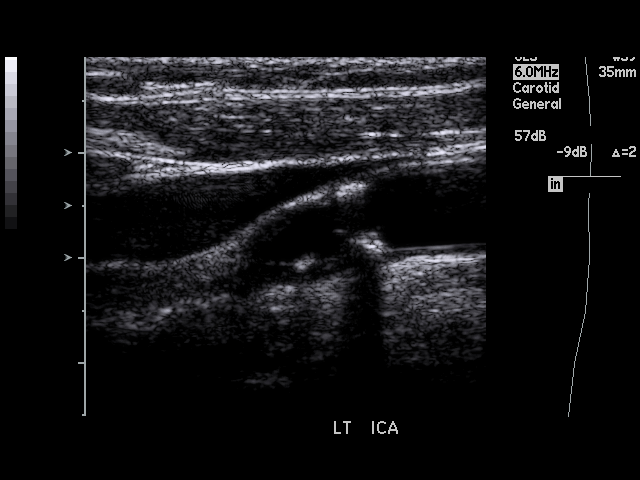
[im 35/58]
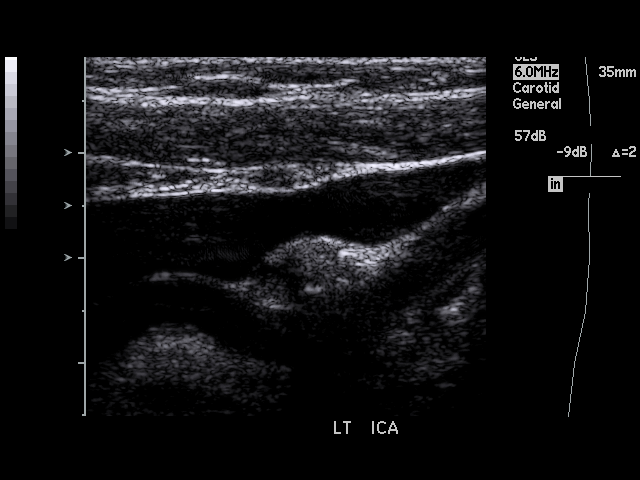
[im 40/58]
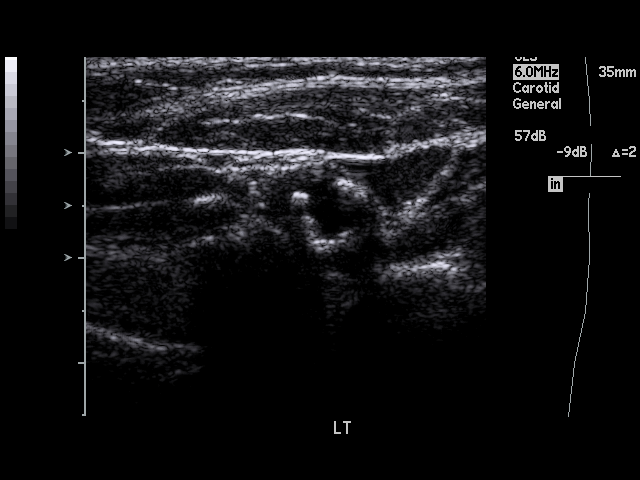
[im 43/58]
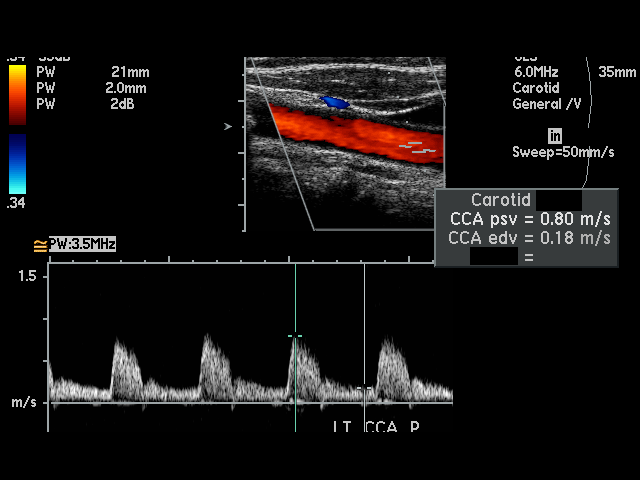
[im 48/58]
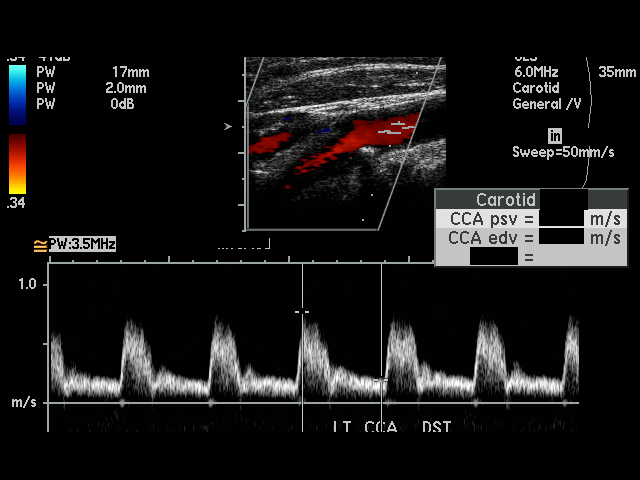
[im 50/58]
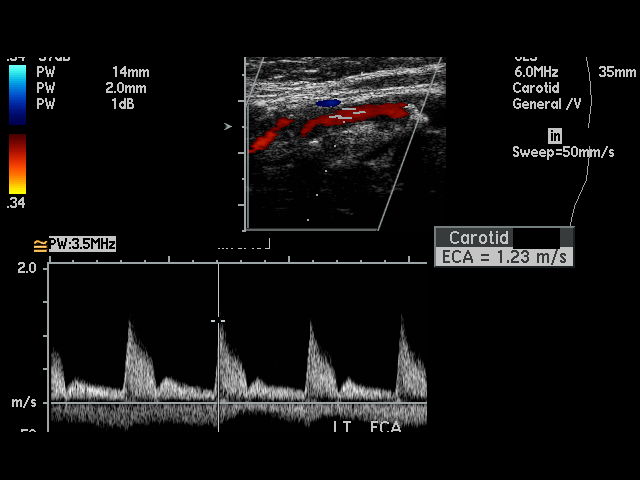
[im 53/58]
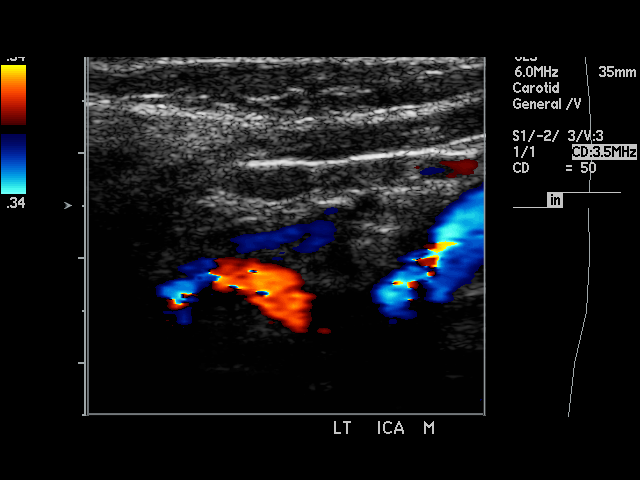
[im 58/58]
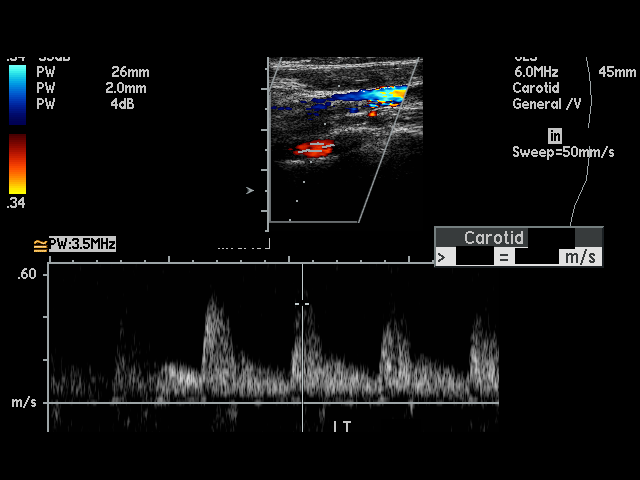

[17 of 24 positions shown; findings below may reference images not displayed]

PROCEDURE:     US  - US CAROTID DOPPLER BILATERAL  - July 04, 2009 [DATE]

RESULT:     There is a small amount of calcific plaque formation about the
carotid bifurcations bilaterally. On the right, the peak right common
carotid artery flow velocity measures 0.665 meters per second and the peak
right internal carotid artery flow velocity measures 0.916 meters per
second. The ICA/CCA ratio is 1.377. On the left, the peak left common
carotid artery flow velocity measures 0.777 meters per second and the peak
left internal carotid artery flow velocity measures 0.96 meters per second.
The ICA/CCA ratio is 1.236. These values are in the normal range and are
consistent with the absence of hemodynamically significant stenosis.

There is observed antegrade flow in both vertebrals.
IMPRESSION: 1. There is a small amount of calcific plaque formation about the carotid
bifurcations bilaterally.
2. No hemodynamically significant stenosis is seen on either side.
3. There is antegrade flow in both vertebrals.

## 2011-02-22 ENCOUNTER — Emergency Department: Payer: Self-pay | Admitting: Emergency Medicine

## 2011-02-24 DIAGNOSIS — E785 Hyperlipidemia, unspecified: Secondary | ICD-10-CM | POA: Insufficient documentation

## 2011-02-24 DIAGNOSIS — Z905 Acquired absence of kidney: Secondary | ICD-10-CM | POA: Insufficient documentation

## 2011-02-24 DIAGNOSIS — I340 Nonrheumatic mitral (valve) insufficiency: Secondary | ICD-10-CM | POA: Insufficient documentation

## 2011-02-24 DIAGNOSIS — K279 Peptic ulcer, site unspecified, unspecified as acute or chronic, without hemorrhage or perforation: Secondary | ICD-10-CM | POA: Insufficient documentation

## 2011-02-24 DIAGNOSIS — I701 Atherosclerosis of renal artery: Secondary | ICD-10-CM | POA: Insufficient documentation

## 2011-02-24 DIAGNOSIS — Z9889 Other specified postprocedural states: Secondary | ICD-10-CM | POA: Insufficient documentation

## 2011-02-24 DIAGNOSIS — J45909 Unspecified asthma, uncomplicated: Secondary | ICD-10-CM | POA: Insufficient documentation

## 2011-02-24 DIAGNOSIS — I351 Nonrheumatic aortic (valve) insufficiency: Secondary | ICD-10-CM | POA: Insufficient documentation

## 2011-02-24 DIAGNOSIS — K219 Gastro-esophageal reflux disease without esophagitis: Secondary | ICD-10-CM | POA: Insufficient documentation

## 2011-02-24 HISTORY — DX: Other specified postprocedural states: Z98.890

## 2011-02-28 ENCOUNTER — Emergency Department: Payer: Self-pay | Admitting: Emergency Medicine

## 2011-03-01 LAB — URINALYSIS, COMPLETE
Bacteria: NONE SEEN
Bilirubin,UR: NEGATIVE
Blood: NEGATIVE
Glucose,UR: NEGATIVE mg/dL (ref 0–75)
Nitrite: NEGATIVE
Ph: 6 (ref 4.5–8.0)
Protein: 30
RBC,UR: 2 /HPF (ref 0–5)
Specific Gravity: 1.02 (ref 1.003–1.030)
Squamous Epithelial: 4
WBC UR: 1 /HPF (ref 0–5)

## 2011-03-01 LAB — LIPASE, BLOOD: Lipase: 348 U/L (ref 73–393)

## 2011-03-01 LAB — CBC
HCT: 40.4 % (ref 35.0–47.0)
HGB: 13.4 g/dL (ref 12.0–16.0)
MCH: 28.9 pg (ref 26.0–34.0)
MCHC: 33.1 g/dL (ref 32.0–36.0)
MCV: 87 fL (ref 80–100)
Platelet: 257 10*3/uL (ref 150–440)
RBC: 4.62 10*6/uL (ref 3.80–5.20)
RDW: 14 % (ref 11.5–14.5)
WBC: 10.2 10*3/uL (ref 3.6–11.0)

## 2011-03-01 LAB — COMPREHENSIVE METABOLIC PANEL
Albumin: 3.8 g/dL (ref 3.4–5.0)
Alkaline Phosphatase: 92 U/L (ref 50–136)
Anion Gap: 10 (ref 7–16)
BUN: 21 mg/dL — ABNORMAL HIGH (ref 7–18)
Bilirubin,Total: 0.5 mg/dL (ref 0.2–1.0)
Calcium, Total: 9 mg/dL (ref 8.5–10.1)
Chloride: 104 mmol/L (ref 98–107)
Co2: 26 mmol/L (ref 21–32)
Creatinine: 1.97 mg/dL — ABNORMAL HIGH (ref 0.60–1.30)
EGFR (African American): 32 — ABNORMAL LOW
EGFR (Non-African Amer.): 27 — ABNORMAL LOW
Glucose: 143 mg/dL — ABNORMAL HIGH (ref 65–99)
Osmolality: 285 (ref 275–301)
Potassium: 4.9 mmol/L (ref 3.5–5.1)
SGOT(AST): 26 U/L (ref 15–37)
SGPT (ALT): 19 U/L
Sodium: 140 mmol/L (ref 136–145)
Total Protein: 7.2 g/dL (ref 6.4–8.2)

## 2011-03-05 ENCOUNTER — Ambulatory Visit: Payer: Self-pay | Admitting: Unknown Physician Specialty

## 2011-03-09 LAB — PATHOLOGY REPORT

## 2011-03-16 ENCOUNTER — Inpatient Hospital Stay: Payer: Self-pay | Admitting: Internal Medicine

## 2011-03-17 LAB — BASIC METABOLIC PANEL
Anion Gap: 13 (ref 7–16)
BUN: 31 mg/dL — ABNORMAL HIGH (ref 7–18)
Calcium, Total: 8.8 mg/dL (ref 8.5–10.1)
Chloride: 102 mmol/L (ref 98–107)
Co2: 26 mmol/L (ref 21–32)
Creatinine: 1.55 mg/dL — ABNORMAL HIGH (ref 0.60–1.30)
EGFR (African American): 43 — ABNORMAL LOW
EGFR (Non-African Amer.): 35 — ABNORMAL LOW
Glucose: 175 mg/dL — ABNORMAL HIGH (ref 65–99)
Osmolality: 292 (ref 275–301)
Potassium: 4.4 mmol/L (ref 3.5–5.1)
Sodium: 141 mmol/L (ref 136–145)

## 2011-03-17 LAB — CBC WITH DIFFERENTIAL/PLATELET
Basophil #: 0 10*3/uL (ref 0.0–0.1)
Basophil %: 0 %
Eosinophil #: 0 10*3/uL (ref 0.0–0.7)
Eosinophil %: 0.1 %
HCT: 41.6 % (ref 35.0–47.0)
HGB: 14 g/dL (ref 12.0–16.0)
Lymphocyte #: 1.7 10*3/uL (ref 1.0–3.6)
Lymphocyte %: 12 %
MCH: 29.5 pg (ref 26.0–34.0)
MCHC: 33.7 g/dL (ref 32.0–36.0)
MCV: 88 fL (ref 80–100)
Monocyte #: 0 10*3/uL (ref 0.0–0.7)
Monocyte %: 0.3 %
Neutrophil #: 12.6 10*3/uL — ABNORMAL HIGH (ref 1.4–6.5)
Neutrophil %: 87.6 %
Platelet: 294 10*3/uL (ref 150–440)
RBC: 4.75 10*6/uL (ref 3.80–5.20)
RDW: 14.2 % (ref 11.5–14.5)
WBC: 14.4 10*3/uL — ABNORMAL HIGH (ref 3.6–11.0)

## 2011-03-18 LAB — CBC WITH DIFFERENTIAL/PLATELET
Basophil #: 0 10*3/uL (ref 0.0–0.1)
Basophil %: 0 %
Eosinophil #: 0 10*3/uL (ref 0.0–0.7)
Eosinophil %: 0 %
HCT: 37.3 % (ref 35.0–47.0)
HGB: 12.3 g/dL (ref 12.0–16.0)
Lymphocyte #: 1.4 10*3/uL (ref 1.0–3.6)
Lymphocyte %: 5.2 %
MCH: 29 pg (ref 26.0–34.0)
MCHC: 33 g/dL (ref 32.0–36.0)
MCV: 88 fL (ref 80–100)
Monocyte #: 0 10*3/uL (ref 0.0–0.7)
Monocyte %: 0.1 %
Neutrophil #: 25.1 10*3/uL — ABNORMAL HIGH (ref 1.4–6.5)
Neutrophil %: 94.7 %
Platelet: 255 10*3/uL (ref 150–440)
RBC: 4.24 10*6/uL (ref 3.80–5.20)
RDW: 13.9 % (ref 11.5–14.5)
WBC: 26.5 10*3/uL — ABNORMAL HIGH (ref 3.6–11.0)

## 2011-03-18 LAB — BASIC METABOLIC PANEL
Anion Gap: 11 (ref 7–16)
BUN: 23 mg/dL — ABNORMAL HIGH (ref 7–18)
Calcium, Total: 8.3 mg/dL — ABNORMAL LOW (ref 8.5–10.1)
Chloride: 111 mmol/L — ABNORMAL HIGH (ref 98–107)
Co2: 23 mmol/L (ref 21–32)
Creatinine: 1.26 mg/dL (ref 0.60–1.30)
EGFR (African American): 54 — ABNORMAL LOW
EGFR (Non-African Amer.): 45 — ABNORMAL LOW
Glucose: 154 mg/dL — ABNORMAL HIGH (ref 65–99)
Osmolality: 295 (ref 275–301)
Potassium: 4.2 mmol/L (ref 3.5–5.1)
Sodium: 145 mmol/L (ref 136–145)

## 2011-03-19 LAB — CBC WITH DIFFERENTIAL/PLATELET
Basophil #: 0 10*3/uL (ref 0.0–0.1)
Basophil %: 0 %
Eosinophil #: 0 10*3/uL (ref 0.0–0.7)
Eosinophil %: 0 %
HCT: 35.7 % (ref 35.0–47.0)
HGB: 11.8 g/dL — ABNORMAL LOW (ref 12.0–16.0)
Lymphocyte #: 1.3 10*3/uL (ref 1.0–3.6)
Lymphocyte %: 5.3 %
MCH: 29 pg (ref 26.0–34.0)
MCHC: 32.9 g/dL (ref 32.0–36.0)
MCV: 88 fL (ref 80–100)
Monocyte #: 0.1 10*3/uL (ref 0.0–0.7)
Monocyte %: 0.6 %
Neutrophil #: 23.1 10*3/uL — ABNORMAL HIGH (ref 1.4–6.5)
Neutrophil %: 94.1 %
Platelet: 260 10*3/uL (ref 150–440)
RBC: 4.06 10*6/uL (ref 3.80–5.20)
RDW: 14.2 % (ref 11.5–14.5)
WBC: 24.5 10*3/uL — ABNORMAL HIGH (ref 3.6–11.0)

## 2011-03-19 LAB — BASIC METABOLIC PANEL
Anion Gap: 13 (ref 7–16)
BUN: 20 mg/dL — ABNORMAL HIGH (ref 7–18)
Calcium, Total: 8.1 mg/dL — ABNORMAL LOW (ref 8.5–10.1)
Chloride: 112 mmol/L — ABNORMAL HIGH (ref 98–107)
Co2: 20 mmol/L — ABNORMAL LOW (ref 21–32)
Creatinine: 1.2 mg/dL (ref 0.60–1.30)
EGFR (African American): 58 — ABNORMAL LOW
EGFR (Non-African Amer.): 47 — ABNORMAL LOW
Glucose: 150 mg/dL — ABNORMAL HIGH (ref 65–99)
Osmolality: 294 (ref 275–301)
Potassium: 4.2 mmol/L (ref 3.5–5.1)
Sodium: 145 mmol/L (ref 136–145)

## 2011-03-20 LAB — CBC WITH DIFFERENTIAL/PLATELET
Basophil #: 0 10*3/uL (ref 0.0–0.1)
Basophil %: 0.2 %
Eosinophil #: 0 10*3/uL (ref 0.0–0.7)
Eosinophil %: 0 %
HCT: 38.5 % (ref 35.0–47.0)
HGB: 12.7 g/dL (ref 12.0–16.0)
Lymphocyte #: 1.5 10*3/uL (ref 1.0–3.6)
Lymphocyte %: 7.4 %
MCH: 28.9 pg (ref 26.0–34.0)
MCHC: 33 g/dL (ref 32.0–36.0)
MCV: 88 fL (ref 80–100)
Monocyte #: 0.4 10*3/uL (ref 0.0–0.7)
Monocyte %: 2 %
Neutrophil #: 18 10*3/uL — ABNORMAL HIGH (ref 1.4–6.5)
Neutrophil %: 90.4 %
Platelet: 243 10*3/uL (ref 150–440)
RBC: 4.4 10*6/uL (ref 3.80–5.20)
RDW: 14.3 % (ref 11.5–14.5)
WBC: 20 10*3/uL — ABNORMAL HIGH (ref 3.6–11.0)

## 2011-03-20 LAB — BASIC METABOLIC PANEL
Anion Gap: 13 (ref 7–16)
BUN: 21 mg/dL — ABNORMAL HIGH (ref 7–18)
Calcium, Total: 8.1 mg/dL — ABNORMAL LOW (ref 8.5–10.1)
Chloride: 111 mmol/L — ABNORMAL HIGH (ref 98–107)
Co2: 20 mmol/L — ABNORMAL LOW (ref 21–32)
Creatinine: 1.12 mg/dL (ref 0.60–1.30)
EGFR (African American): >60
EGFR (Non-African Amer.): 51 — ABNORMAL LOW
Glucose: 159 mg/dL — ABNORMAL HIGH (ref 65–99)
Osmolality: 293 (ref 275–301)
Potassium: 4.4 mmol/L (ref 3.5–5.1)
Sodium: 144 mmol/L (ref 136–145)

## 2011-03-21 LAB — URINALYSIS, COMPLETE
Bilirubin,UR: NEGATIVE
Glucose,UR: NEGATIVE mg/dL (ref 0–75)
Ketone: NEGATIVE
Nitrite: POSITIVE
Ph: 8 (ref 4.5–8.0)
Protein: NEGATIVE
RBC,UR: 25 /HPF (ref 0–5)
Specific Gravity: 1.01 (ref 1.003–1.030)
Squamous Epithelial: <1
WBC UR: 26 /HPF (ref 0–5)

## 2011-03-21 LAB — BASIC METABOLIC PANEL
Anion Gap: 11 (ref 7–16)
BUN: 22 mg/dL — ABNORMAL HIGH (ref 7–18)
Calcium, Total: 8.2 mg/dL — ABNORMAL LOW (ref 8.5–10.1)
Chloride: 109 mmol/L — ABNORMAL HIGH (ref 98–107)
Co2: 23 mmol/L (ref 21–32)
Creatinine: 1.13 mg/dL (ref 0.60–1.30)
EGFR (African American): >60
EGFR (Non-African Amer.): 51 — ABNORMAL LOW
Glucose: 137 mg/dL — ABNORMAL HIGH (ref 65–99)
Osmolality: 290 (ref 275–301)
Potassium: 4.3 mmol/L (ref 3.5–5.1)
Sodium: 143 mmol/L (ref 136–145)

## 2011-03-21 LAB — CBC WITH DIFFERENTIAL/PLATELET
Basophil #: 0 10*3/uL (ref 0.0–0.1)
Basophil %: 0 %
Eosinophil #: 0 10*3/uL (ref 0.0–0.7)
Eosinophil %: 0 %
HCT: 39 % (ref 35.0–47.0)
HGB: 12.8 g/dL (ref 12.0–16.0)
Lymphocyte #: 1.7 10*3/uL (ref 1.0–3.6)
Lymphocyte %: 9.1 %
MCH: 28.8 pg (ref 26.0–34.0)
MCHC: 32.9 g/dL (ref 32.0–36.0)
MCV: 88 fL (ref 80–100)
Monocyte #: 0.6 10*3/uL (ref 0.0–0.7)
Monocyte %: 3.1 %
Neutrophil #: 16.4 10*3/uL — ABNORMAL HIGH (ref 1.4–6.5)
Neutrophil %: 87.8 %
Platelet: 249 10*3/uL (ref 150–440)
RBC: 4.45 10*6/uL (ref 3.80–5.20)
RDW: 13.9 % (ref 11.5–14.5)
WBC: 18.7 10*3/uL — ABNORMAL HIGH (ref 3.6–11.0)

## 2011-03-22 LAB — BASIC METABOLIC PANEL
Anion Gap: 12 (ref 7–16)
BUN: 26 mg/dL — ABNORMAL HIGH (ref 7–18)
Calcium, Total: 8.2 mg/dL — ABNORMAL LOW (ref 8.5–10.1)
Chloride: 108 mmol/L — ABNORMAL HIGH (ref 98–107)
Co2: 22 mmol/L (ref 21–32)
Creatinine: 1.13 mg/dL (ref 0.60–1.30)
EGFR (African American): >60
EGFR (Non-African Amer.): 51 — ABNORMAL LOW
Glucose: 149 mg/dL — ABNORMAL HIGH (ref 65–99)
Osmolality: 291 (ref 275–301)
Potassium: 4.5 mmol/L (ref 3.5–5.1)
Sodium: 142 mmol/L (ref 136–145)

## 2011-03-22 LAB — CBC WITH DIFFERENTIAL/PLATELET
Basophil #: 0.1 10*3/uL (ref 0.0–0.1)
Basophil %: 0.3 %
Eosinophil #: 0 10*3/uL (ref 0.0–0.7)
Eosinophil %: 0 %
HCT: 40.4 % (ref 35.0–47.0)
HGB: 13.3 g/dL (ref 12.0–16.0)
Lymphocyte #: 1.6 10*3/uL (ref 1.0–3.6)
Lymphocyte %: 8.7 %
MCH: 29 pg (ref 26.0–34.0)
MCHC: 32.9 g/dL (ref 32.0–36.0)
MCV: 88 fL (ref 80–100)
Monocyte #: 0.6 10*3/uL (ref 0.0–0.7)
Monocyte %: 3.2 %
Neutrophil #: 15.7 10*3/uL — ABNORMAL HIGH (ref 1.4–6.5)
Neutrophil %: 87.8 %
Platelet: 264 10*3/uL (ref 150–440)
RBC: 4.58 10*6/uL (ref 3.80–5.20)
RDW: 13.9 % (ref 11.5–14.5)
WBC: 17.9 10*3/uL — ABNORMAL HIGH (ref 3.6–11.0)

## 2011-03-23 LAB — CBC WITH DIFFERENTIAL/PLATELET
Basophil #: 0 10*3/uL (ref 0.0–0.1)
Basophil %: 0.1 %
Eosinophil #: 0 10*3/uL (ref 0.0–0.7)
Eosinophil %: 0 %
HCT: 41.3 % (ref 35.0–47.0)
HGB: 13.7 g/dL (ref 12.0–16.0)
Lymphocyte #: 1.3 10*3/uL (ref 1.0–3.6)
Lymphocyte %: 6.5 %
MCH: 29.1 pg (ref 26.0–34.0)
MCHC: 33.1 g/dL (ref 32.0–36.0)
MCV: 88 fL (ref 80–100)
Monocyte #: 0.4 10*3/uL (ref 0.0–0.7)
Monocyte %: 1.9 %
Neutrophil #: 18.6 10*3/uL — ABNORMAL HIGH (ref 1.4–6.5)
Neutrophil %: 91.5 %
Platelet: 271 10*3/uL (ref 150–440)
RBC: 4.7 10*6/uL (ref 3.80–5.20)
RDW: 14 % (ref 11.5–14.5)
WBC: 20.4 10*3/uL — ABNORMAL HIGH (ref 3.6–11.0)

## 2011-03-23 LAB — BASIC METABOLIC PANEL
Anion Gap: 13 (ref 7–16)
BUN: 22 mg/dL — ABNORMAL HIGH (ref 7–18)
Calcium, Total: 8.2 mg/dL — ABNORMAL LOW (ref 8.5–10.1)
Chloride: 107 mmol/L (ref 98–107)
Co2: 21 mmol/L (ref 21–32)
Creatinine: 1.01 mg/dL (ref 0.60–1.30)
EGFR (African American): >60
EGFR (Non-African Amer.): 58 — ABNORMAL LOW
Glucose: 159 mg/dL — ABNORMAL HIGH (ref 65–99)
Osmolality: 288 (ref 275–301)
Potassium: 4.6 mmol/L (ref 3.5–5.1)
Sodium: 141 mmol/L (ref 136–145)

## 2011-03-23 LAB — URINE CULTURE

## 2011-03-24 LAB — BASIC METABOLIC PANEL
Anion Gap: 13 (ref 7–16)
BUN: 26 mg/dL — ABNORMAL HIGH (ref 7–18)
Calcium, Total: 8 mg/dL — ABNORMAL LOW (ref 8.5–10.1)
Chloride: 108 mmol/L — ABNORMAL HIGH (ref 98–107)
Co2: 22 mmol/L (ref 21–32)
Creatinine: 1.06 mg/dL (ref 0.60–1.30)
EGFR (African American): >60
EGFR (Non-African Amer.): 55 — ABNORMAL LOW
Glucose: 169 mg/dL — ABNORMAL HIGH (ref 65–99)
Osmolality: 294 (ref 275–301)
Potassium: 4.7 mmol/L (ref 3.5–5.1)
Sodium: 143 mmol/L (ref 136–145)

## 2011-03-24 LAB — CBC WITH DIFFERENTIAL/PLATELET
Basophil #: 0 10*3/uL (ref 0.0–0.1)
Basophil %: 0.2 %
Eosinophil #: 0 10*3/uL (ref 0.0–0.7)
Eosinophil %: 0 %
HCT: 40.7 % (ref 35.0–47.0)
HGB: 13.4 g/dL (ref 12.0–16.0)
Lymphocyte #: 1.4 10*3/uL (ref 1.0–3.6)
Lymphocyte %: 6.8 %
MCH: 29 pg (ref 26.0–34.0)
MCHC: 32.8 g/dL (ref 32.0–36.0)
MCV: 88 fL (ref 80–100)
Monocyte #: 0.6 10*3/uL (ref 0.0–0.7)
Monocyte %: 3 %
Neutrophil #: 18.1 10*3/uL — ABNORMAL HIGH (ref 1.4–6.5)
Neutrophil %: 90 %
Platelet: 253 10*3/uL (ref 150–440)
RBC: 4.6 10*6/uL (ref 3.80–5.20)
RDW: 14.4 % (ref 11.5–14.5)
WBC: 20.1 10*3/uL — ABNORMAL HIGH (ref 3.6–11.0)

## 2011-03-25 LAB — CBC WITH DIFFERENTIAL/PLATELET
Basophil #: 0 10*3/uL (ref 0.0–0.1)
Basophil %: 0.1 %
Eosinophil #: 0 10*3/uL (ref 0.0–0.7)
Eosinophil %: 0 %
HCT: 40.7 % (ref 35.0–47.0)
HGB: 13.4 g/dL (ref 12.0–16.0)
Lymphocyte #: 1.2 10*3/uL (ref 1.0–3.6)
Lymphocyte %: 6.7 %
MCH: 29.2 pg (ref 26.0–34.0)
MCHC: 33 g/dL (ref 32.0–36.0)
MCV: 88 fL (ref 80–100)
Monocyte #: 0.4 10*3/uL (ref 0.0–0.7)
Monocyte %: 2.2 %
Neutrophil #: 15.6 10*3/uL — ABNORMAL HIGH (ref 1.4–6.5)
Neutrophil %: 91 %
Platelet: 259 10*3/uL (ref 150–440)
RBC: 4.61 10*6/uL (ref 3.80–5.20)
RDW: 13.9 % (ref 11.5–14.5)
WBC: 17.1 10*3/uL — ABNORMAL HIGH (ref 3.6–11.0)

## 2011-03-25 LAB — BASIC METABOLIC PANEL
Anion Gap: 13 (ref 7–16)
BUN: 27 mg/dL — ABNORMAL HIGH (ref 7–18)
Calcium, Total: 8.2 mg/dL — ABNORMAL LOW (ref 8.5–10.1)
Chloride: 105 mmol/L (ref 98–107)
Co2: 22 mmol/L (ref 21–32)
Creatinine: 0.96 mg/dL (ref 0.60–1.30)
EGFR (African American): >60
EGFR (Non-African Amer.): >60
Glucose: 201 mg/dL — ABNORMAL HIGH (ref 65–99)
Osmolality: 290 (ref 275–301)
Potassium: 4.9 mmol/L (ref 3.5–5.1)
Sodium: 140 mmol/L (ref 136–145)

## 2011-03-26 LAB — BASIC METABOLIC PANEL
Anion Gap: 10 (ref 7–16)
BUN: 29 mg/dL — ABNORMAL HIGH (ref 7–18)
Calcium, Total: 8.6 mg/dL (ref 8.5–10.1)
Chloride: 104 mmol/L (ref 98–107)
Co2: 24 mmol/L (ref 21–32)
Creatinine: 1.1 mg/dL (ref 0.60–1.30)
EGFR (African American): >60
EGFR (Non-African Amer.): 52 — ABNORMAL LOW
Glucose: 165 mg/dL — ABNORMAL HIGH (ref 65–99)
Osmolality: 285 (ref 275–301)
Potassium: 4.7 mmol/L (ref 3.5–5.1)
Sodium: 138 mmol/L (ref 136–145)

## 2011-03-26 LAB — CBC WITH DIFFERENTIAL/PLATELET
Basophil #: 0 10*3/uL (ref 0.0–0.1)
Basophil %: 0.2 %
Eosinophil #: 0 10*3/uL (ref 0.0–0.7)
Eosinophil %: 0.1 %
HCT: 42.1 % (ref 35.0–47.0)
HGB: 13.8 g/dL (ref 12.0–16.0)
Lymphocyte #: 1.3 10*3/uL (ref 1.0–3.6)
Lymphocyte %: 6.6 %
MCH: 28.8 pg (ref 26.0–34.0)
MCHC: 32.8 g/dL (ref 32.0–36.0)
MCV: 88 fL (ref 80–100)
Monocyte #: 0.5 10*3/uL (ref 0.0–0.7)
Monocyte %: 2.7 %
Neutrophil #: 18.3 10*3/uL — ABNORMAL HIGH (ref 1.4–6.5)
Neutrophil %: 90.4 %
Platelet: 262 10*3/uL (ref 150–440)
RBC: 4.79 10*6/uL (ref 3.80–5.20)
RDW: 14.1 % (ref 11.5–14.5)
WBC: 20.3 10*3/uL — ABNORMAL HIGH (ref 3.6–11.0)

## 2011-03-29 LAB — CBC WITH DIFFERENTIAL/PLATELET
Basophil #: 0.2 10*3/uL — ABNORMAL HIGH (ref 0.0–0.1)
Basophil %: 0.8 %
Eosinophil #: 0 10*3/uL (ref 0.0–0.7)
Eosinophil %: 0 %
HCT: 39 % (ref 35.0–47.0)
HGB: 13.1 g/dL (ref 12.0–16.0)
Lymphocyte #: 1.3 10*3/uL (ref 1.0–3.6)
Lymphocyte %: 7 %
MCH: 29.3 pg (ref 26.0–34.0)
MCHC: 33.5 g/dL (ref 32.0–36.0)
MCV: 87 fL (ref 80–100)
Monocyte #: 0.6 10*3/uL (ref 0.0–0.7)
Monocyte %: 3 %
Neutrophil #: 16.8 10*3/uL — ABNORMAL HIGH (ref 1.4–6.5)
Neutrophil %: 89.2 %
Platelet: 217 10*3/uL (ref 150–440)
RBC: 4.46 10*6/uL (ref 3.80–5.20)
RDW: 14.3 % (ref 11.5–14.5)
WBC: 18.9 10*3/uL — ABNORMAL HIGH (ref 3.6–11.0)

## 2011-03-29 LAB — BASIC METABOLIC PANEL
Anion Gap: 14 (ref 7–16)
BUN: 28 mg/dL — ABNORMAL HIGH (ref 7–18)
Calcium, Total: 8 mg/dL — ABNORMAL LOW (ref 8.5–10.1)
Chloride: 104 mmol/L (ref 98–107)
Co2: 22 mmol/L (ref 21–32)
Creatinine: 1.03 mg/dL (ref 0.60–1.30)
EGFR (African American): >60
EGFR (Non-African Amer.): 57 — ABNORMAL LOW
Glucose: 170 mg/dL — ABNORMAL HIGH (ref 65–99)
Osmolality: 289 (ref 275–301)
Potassium: 4.4 mmol/L (ref 3.5–5.1)
Sodium: 140 mmol/L (ref 136–145)

## 2011-04-29 LAB — PULMONARY FUNCTION TEST

## 2011-05-07 ENCOUNTER — Other Ambulatory Visit: Payer: Self-pay | Admitting: Unknown Physician Specialty

## 2011-05-07 LAB — CLOSTRIDIUM DIFFICILE BY PCR

## 2011-05-12 LAB — STOOL CULTURE

## 2011-05-13 ENCOUNTER — Ambulatory Visit: Payer: Self-pay | Admitting: Unknown Physician Specialty

## 2011-05-19 ENCOUNTER — Encounter: Payer: Self-pay | Admitting: Specialist

## 2011-06-03 ENCOUNTER — Encounter: Payer: Self-pay | Admitting: Specialist

## 2011-07-03 ENCOUNTER — Ambulatory Visit: Payer: Self-pay | Admitting: Internal Medicine

## 2011-07-04 ENCOUNTER — Encounter: Payer: Self-pay | Admitting: Specialist

## 2011-07-09 ENCOUNTER — Ambulatory Visit: Payer: Self-pay | Admitting: Internal Medicine

## 2011-08-03 ENCOUNTER — Encounter: Payer: Self-pay | Admitting: Specialist

## 2011-09-03 ENCOUNTER — Encounter: Payer: Self-pay | Admitting: Specialist

## 2011-09-08 ENCOUNTER — Encounter: Payer: Self-pay | Admitting: Pulmonary Disease

## 2011-09-09 ENCOUNTER — Encounter: Payer: Self-pay | Admitting: Pulmonary Disease

## 2011-09-09 ENCOUNTER — Ambulatory Visit (INDEPENDENT_AMBULATORY_CARE_PROVIDER_SITE_OTHER): Payer: Medicare Other | Admitting: Pulmonary Disease

## 2011-09-09 VITALS — BP 140/72 | HR 81 | Temp 97.7°F | Ht 64.0 in | Wt 157.0 lb

## 2011-09-09 DIAGNOSIS — R0902 Hypoxemia: Secondary | ICD-10-CM

## 2011-09-09 DIAGNOSIS — G4734 Idiopathic sleep related nonobstructive alveolar hypoventilation: Secondary | ICD-10-CM

## 2011-09-09 DIAGNOSIS — G4733 Obstructive sleep apnea (adult) (pediatric): Secondary | ICD-10-CM

## 2011-09-09 DIAGNOSIS — J449 Chronic obstructive pulmonary disease, unspecified: Secondary | ICD-10-CM | POA: Insufficient documentation

## 2011-09-09 DIAGNOSIS — J441 Chronic obstructive pulmonary disease with (acute) exacerbation: Secondary | ICD-10-CM | POA: Insufficient documentation

## 2011-09-09 HISTORY — DX: Idiopathic sleep related nonobstructive alveolar hypoventilation: G47.34

## 2011-09-09 NOTE — Patient Instructions (Signed)
Call us with the name and number of you medical supply company so we can give you the oxygen Keep going to pulmonary rehab Keep using your CPAP machine Keep using dulera and albuterol We will see you back in 3-4 months or sooner if needed

## 2011-09-09 NOTE — Assessment & Plan Note (Signed)
COPD: GOLD Grade A (one severe exacerbation in 2013) Combined recommendations from the [employer], [employer], Designer, television/film set, European Respiratory Society (Qaseem A et al, Ann Intern Med. 2011;155(3):179) recommends tobacco cessation, pulmonary rehab (for symptomatic patients with an FEV1 < 50% predicted), supplemental oxygen (for patients with SaO2 <88% or paO2 <55), and appropriate bronchodilator therapy.  In regards to long acting bronchodilators, they recommend monotherapy (FEV1 60-80% with symptoms weak evidence, FEV1 with symptoms <60% strong evidence), or combination therapy (FEV1 <60% with symptoms, strong recommendation, moderate evidence).  One should also provide patients with annual immunizations and consider therapy for prevention of COPD exacerbations (ie. roflumilast or azithromycin) when appopriate.  -O2 therapy: not indicated -Immunizations: review next visit -Tobacco use: quit 1976 -Exercise: participates in pulmonary rehab -Bronchodilator therapy: Continue dulera and albuterol -Exacerbation prevention: n/a

## 2011-09-09 NOTE — Progress Notes (Signed)
Subjective:    Patient ID: Kerri Carter, female    DOB: 1940-11-04, 71 y.o.   MRN: [id number]  HPI This is a very pleasant 71 year old female who comes to our clinic today to establish care for COPD and asthma. She states that she had a normal childhood without respiratory difficulty but in 2006 she developed nighttime shortness of breath and progressive wheezing. She eventually had to go to an emergency room for evaluation where she was told that she had asthma. She was started on albuterol and Advair at that time but she states that the Advair is associated with weight gain so she stopped it. Not long after that she started to get short of breath again so she had to start using Dulera. In early 2013 she was hospitalized for cough, congestion, and shortness of breath. She had been treated as an outpatient with prednisone prior but this did not improve her symptoms. She did not require an ICU stay. After hospital discharge she was continued on [facility] and albuterol. She has been participating in pulmonary rehabilitation at [facility] for the last several months. She had a lengthy interruption in pulmonary rehabilitation do to a sick sibling requiring her to be out of state for several weeks.  She states that she is currently using to do where regularly and has only had to use albuterol 3 times in the last month. Typically when she gets short of breath is at night when she will wake up gasping for air. She denies leg swelling or chest pain. She has "leaky valves" but states that she does not think that she has heart failure.  She has been found to have obstructive sleep apnea and is currently using CPAP at 6 cm of water. A recent overnight oximetry test showed that 72% of the time her oxygen level is less than 88%. She currently is not using oxygen.  She rarely has cough. She has chronic sinus symptoms which are well controlled with Singulair.    Past Medical History  Diagnosis Date  . Hypothyroidism   .  HTN (hypertension)   . CRI (chronic renal insufficiency)   . Hyperlipidemia   . Diabetes mellitus   . Asthma   . Angioedema   . Anaphylaxis   . Osteoarthritis     hands, low titer rheumatoid factor, carpal tunnel  . Carpal tunnel syndrome      Family History  Problem Relation Age of Onset  . Kidney disease    . Rheum arthritis Mother   . Diabetes    . Asthma Mother   . Asthma Sister   . Lung cancer Sister     was a smoker  . Breast cancer Sister   . Heart disease Sister      History   Social History  . Marital Status: Married    Spouse Name: N/A    Number of Children: N/A  . Years of Education: N/A   Occupational History  . Not on file.   Social History Main Topics  . Smoking status: Former Smoker -- 0.5 packs/day for 20 years    Types: Cigarettes    Quit date: 02/02/1974  . Smokeless tobacco: Never Used  . Alcohol Use: No  . Drug Use: No  . Sexually Active: Not on file   Other Topics Concern  . Not on file   Social History Narrative  . No narrative on file     Allergies  Allergen Reactions  . Ceftin (Cefuroxime Axetil) Anaphylaxis  REACTION: tongue and throat swell  . Lisinopril Anaphylaxis    REACTION: tongue and throat swelling (onset 10-10-09)  . Antihistamines, Chlorpheniramine-Type     REACTION: makes pt hyper  . Antivert (Meclizine Hcl)     REACTION: bladder will not empty  . Decongestant (Pseudoephedrine Hcl)     REACTION: makes pt hyper  . Doxycycline     REACTION: GI upset  . Adhesive (Tape) Rash  . Iodine Hives and Rash  . Levaquin (Levofloxacin In D5w) Rash  . Tetanus Toxoids     REACTION: rash, fever, hot to touch at injection site     Outpatient Prescriptions Prior to Visit  Medication Sig Dispense Refill  . aspirin 81 MG tablet Take 81 mg by mouth daily.      . carvedilol (COREG) 3.125 MG tablet Take 3.125 mg by mouth 2 (two) times daily with a meal.      . furosemide (LASIX) 20 MG tablet 1/2 tab by mouth every morning        . lovastatin (ALTOPREV) 20 MG 24 hr tablet Take 20 mg by mouth at bedtime.      . montelukast (SINGULAIR) 10 MG tablet Take 10 mg by mouth at bedtime.      . pantoprazole (PROTONIX) 40 MG tablet Take 40 mg by mouth 2 (two) times daily.       . potassium chloride (K-DUR,KLOR-CON) 10 MEQ tablet Take 10 mEq by mouth daily. (with lasix)      . QUEtiapine (SEROQUEL) 50 MG tablet Take 50 mg by mouth at bedtime.      . saccharomyces boulardii (FLORASTOR) 250 MG capsule Take 250 mg by mouth as directed.      . zolpidem (AMBIEN CR) 12.5 MG CR tablet Take 12.5 mg by mouth at bedtime as needed.      Marland Kitchen ipratropium (ATROVENT HFA) 17 MCG/ACT inhaler Inhale 2 puffs into the lungs daily.        Review of Systems  Constitutional: Negative for fever, chills and unexpected weight change.  HENT: Negative for ear pain, nosebleeds, congestion, sore throat, rhinorrhea, sneezing, trouble swallowing, dental problem, voice change, postnasal drip and sinus pressure.   Eyes: Negative for visual disturbance.  Respiratory: Positive for shortness of breath. Negative for cough and choking.   Cardiovascular: Negative for chest pain and leg swelling.  Gastrointestinal: Negative for vomiting, abdominal pain and diarrhea.  Genitourinary: Negative for difficulty urinating.  Musculoskeletal: Positive for arthralgias.  Skin: Negative for rash.  Neurological: Negative for tremors, syncope and headaches.  Hematological: Does not bruise/bleed easily.       Objective:   Physical Exam  Filed Vitals:   09/09/11 1458  BP: 140/72  Pulse: 81  Temp: 97.7 F (36.5 C)  TempSrc: Oral  Height: 5\' 4"  (1.626 m)  Weight: 157 lb (71.215 kg)  SpO2: 97%   Gen: well appearing, no acute distress HEENT: NCAT, PERRL, EOMi, OP clear, neck supple without masses PULM: CTA B CV: RRR, systolic murmur LUSB, RUSB, and LLSB, no JVD AB: BS+, soft, nontender, no hsm Ext: warm, no edema, no clubbing, no cyanosis Derm: no rash or skin  breakdown Neuro: A&Ox4, CN II-XII intact, strength 5/5 in all 4 extremities  February 2013 [facility] at [facility]: My review: mild lung hyperinflation, left atrial enlargement, flattening of the diaphragms bilaterally. No infiltrate  March 2013 full PFT at nodal clinic: Ratio 70%, clear obstruction on flow volume loop, FEV1 1.79 L (81% predicted.), Total lung capacity 5.3 L (115% predicted),  residual volume 2.49 L (136% predicted), DLCO 84% predicted     Assessment & Plan:   COPD (chronic obstructive pulmonary disease) COPD: GOLD Grade A (one severe exacerbation in 2013) Combined recommendations from the [employer], [employer] of [employer] Physicians, Designer, television/film set, European Respiratory Society (Qaseem A et al, Ann Intern Med. 2011;155(3):179) recommends tobacco cessation, pulmonary rehab (for symptomatic patients with an FEV1 < 50% predicted), supplemental oxygen (for patients with SaO2 <88% or paO2 <55), and appropriate bronchodilator therapy.  In regards to long acting bronchodilators, they recommend monotherapy (FEV1 60-80% with symptoms weak evidence, FEV1 with symptoms <60% strong evidence), or combination therapy (FEV1 <60% with symptoms, strong recommendation, moderate evidence).  One should also provide patients with annual immunizations and consider therapy for prevention of COPD exacerbations (ie. roflumilast or azithromycin) when appopriate.  -O2 therapy: not indicated -Immunizations: review next visit -Tobacco use: quit 1976 -Exercise: participates in pulmonary rehab -Bronchodilator therapy: Continue dulera and albuterol -Exacerbation prevention: n/a   OSA (obstructive sleep apnea) Plan: -The patient was encouraged to continue using CPAP every night -See nocturnal hypoxemia  Nocturnal hypoxemia Plan: -Based on the results of the June 2003 overnight oximetry study we will start her on 2 L of oxygen continuously and repeat the study at this  dose.   Updated Medication List Outpatient Encounter Prescriptions as of 09/09/2011  Medication Sig Dispense Refill  . albuterol (VENTOLIN HFA) 108 (90 BASE) MCG/ACT inhaler Inhale 2 puffs into the lungs every 6 (six) hours as needed.      Marland Kitchen aspirin 81 MG tablet Take 81 mg by mouth daily.      . carvedilol (COREG) 3.125 MG tablet Take 3.125 mg by mouth 2 (two) times daily with a meal.      . furosemide (LASIX) 20 MG tablet 1/2 tab by mouth every morning      . hydroxychloroquine (PLAQUENIL) 200 MG tablet Take 1 tablet by mouth daily.      Marland Kitchen lovastatin (ALTOPREV) 20 MG 24 hr tablet Take 20 mg by mouth at bedtime.      . Mometasone Furo-Formoterol Fum (DULERA) 200-5 MCG/ACT AERO Inhale 2 puffs into the lungs 2 (two) times daily.      . montelukast (SINGULAIR) 10 MG tablet Take 10 mg by mouth at bedtime.      . pantoprazole (PROTONIX) 40 MG tablet Take 40 mg by mouth 2 (two) times daily.       . potassium chloride (K-DUR,KLOR-CON) 10 MEQ tablet Take 10 mEq by mouth daily. (with lasix)      . QUEtiapine (SEROQUEL) 50 MG tablet Take 50 mg by mouth at bedtime.      . saccharomyces boulardii (FLORASTOR) 250 MG capsule Take 250 mg by mouth as directed.      . zolpidem (AMBIEN CR) 12.5 MG CR tablet Take 12.5 mg by mouth at bedtime as needed.      Marland Kitchen DISCONTD: ipratropium (ATROVENT HFA) 17 MCG/ACT inhaler Inhale 2 puffs into the lungs daily.

## 2011-09-09 NOTE — Assessment & Plan Note (Addendum)
Plan: -The patient was encouraged to continue using CPAP every night -See nocturnal hypoxemia

## 2011-09-09 NOTE — Assessment & Plan Note (Signed)
Plan: -Based on the results of the June 2003 overnight oximetry study we will start her on 2 L of oxygen continuously and repeat the study at this dose.

## 2011-09-10 ENCOUNTER — Telehealth: Payer: Self-pay | Admitting: Pulmonary Disease

## 2011-09-10 DIAGNOSIS — G4733 Obstructive sleep apnea (adult) (pediatric): Secondary | ICD-10-CM

## 2011-09-10 DIAGNOSIS — G4734 Idiopathic sleep related nonobstructive alveolar hypoventilation: Secondary | ICD-10-CM

## 2011-09-10 NOTE — Telephone Encounter (Signed)
Pt called to give Dr. Kendrick Fries DME information. Pls advise what pt needs.

## 2011-09-10 NOTE — Telephone Encounter (Signed)
She needs 2L O2 at night and a follow up ONO.

## 2011-09-11 ENCOUNTER — Telehealth: Payer: Self-pay | Admitting: Pulmonary Disease

## 2011-09-11 DIAGNOSIS — G4734 Idiopathic sleep related nonobstructive alveolar hypoventilation: Secondary | ICD-10-CM

## 2011-09-11 DIAGNOSIS — J449 Chronic obstructive pulmonary disease, unspecified: Secondary | ICD-10-CM

## 2011-09-11 NOTE — Telephone Encounter (Signed)
Per [facility] will not pay for pt's oxygen based of of ONO done in June. The pt must be set up with oxygen 30 days after test is done, if not pt will have to repeat the study. Since pt was not set up with the oxygen after the test she has to have the ono repeated. I have sent order to pcc's. Kerri Carter has already spoken with pt and is aware of all of this Will forward to Dr. Kendrick Fries so he is aware

## 2011-09-11 NOTE — Telephone Encounter (Signed)
As patient was aware that she needs O2 when she saw BQ on 09-09-11, will sign off.  Order has been placed.

## 2011-09-14 NOTE — Telephone Encounter (Signed)
Have her do it on CPAP, no oxygen.

## 2011-09-14 NOTE — Telephone Encounter (Signed)
LISA @ [facility] IN [place] CALLED TO GET CLARIFICATION RE: ONO ON ROOM AIR - PT IS ON A CPAP -PLEASE ADVISE- ALSO NOTE THAT A PULX OX WILL NOT BE AVAILABLE FOR AT LEAST A WEEK (WAITING LIST)- [phone]. Hazel Sams

## 2011-09-14 NOTE — Telephone Encounter (Signed)
ATC lisa but office was closed. Will need to call back in the morning

## 2011-09-15 NOTE — Telephone Encounter (Signed)
Order faxed to [facility] to do ono on ra and faxed all other orders for hen results come bac [facility]

## 2011-09-15 NOTE — Telephone Encounter (Signed)
I spoke with Kerri Carter and she states we will need to refer pt out to someone else to have the ono on cpap RA done. Her pulse ox is out for repair and is not sure when she will receive this back. Please advise [facility] thanks

## 2011-09-22 ENCOUNTER — Other Ambulatory Visit: Payer: Self-pay | Admitting: Critical Care Medicine

## 2011-09-22 DIAGNOSIS — J449 Chronic obstructive pulmonary disease, unspecified: Secondary | ICD-10-CM

## 2011-10-02 ENCOUNTER — Encounter: Payer: Self-pay | Admitting: Pulmonary Disease

## 2011-10-07 ENCOUNTER — Encounter: Payer: Self-pay | Admitting: Pulmonary Disease

## 2011-10-13 ENCOUNTER — Encounter: Payer: Self-pay | Admitting: Pulmonary Disease

## 2011-10-13 DIAGNOSIS — R0602 Shortness of breath: Secondary | ICD-10-CM | POA: Insufficient documentation

## 2011-10-13 DIAGNOSIS — I38 Endocarditis, valve unspecified: Secondary | ICD-10-CM | POA: Insufficient documentation

## 2011-10-13 HISTORY — DX: Shortness of breath: R06.02

## 2011-11-16 ENCOUNTER — Ambulatory Visit: Payer: Self-pay | Admitting: Rheumatology

## 2011-12-02 ENCOUNTER — Ambulatory Visit: Payer: Self-pay | Admitting: Orthopedic Surgery

## 2011-12-02 LAB — BASIC METABOLIC PANEL
Anion Gap: 9 (ref 7–16)
BUN: 17 mg/dL (ref 7–18)
Calcium, Total: 9.1 mg/dL (ref 8.5–10.1)
Chloride: 102 mmol/L (ref 98–107)
Co2: 30 mmol/L (ref 21–32)
Creatinine: 1.4 mg/dL — ABNORMAL HIGH (ref 0.60–1.30)
EGFR (African American): 44 — ABNORMAL LOW
EGFR (Non-African Amer.): 38 — ABNORMAL LOW
Glucose: 113 mg/dL — ABNORMAL HIGH (ref 65–99)
Osmolality: 284 (ref 275–301)
Potassium: 4.4 mmol/L (ref 3.5–5.1)
Sodium: 141 mmol/L (ref 136–145)

## 2011-12-02 LAB — CBC
HCT: 38.2 % (ref 35.0–47.0)
HGB: 12.7 g/dL (ref 12.0–16.0)
MCH: 28.3 pg (ref 26.0–34.0)
MCHC: 33.4 g/dL (ref 32.0–36.0)
MCV: 85 fL (ref 80–100)
Platelet: 258 10*3/uL (ref 150–440)
RBC: 4.5 10*6/uL (ref 3.80–5.20)
RDW: 13.5 % (ref 11.5–14.5)
WBC: 9.5 10*3/uL (ref 3.6–11.0)

## 2011-12-02 LAB — MRSA PCR SCREENING

## 2011-12-02 LAB — APTT: Activated PTT: 26.6 secs (ref 23.6–35.9)

## 2011-12-02 LAB — PROTIME-INR
INR: 0.9
Prothrombin Time: 12.5 secs (ref 11.5–14.7)

## 2011-12-02 LAB — SEDIMENTATION RATE: Erythrocyte Sed Rate: 14 mm/hr (ref 0–30)

## 2011-12-10 ENCOUNTER — Inpatient Hospital Stay: Payer: Self-pay | Admitting: Orthopedic Surgery

## 2011-12-10 HISTORY — PX: TOTAL HIP ARTHROPLASTY: SHX124

## 2011-12-11 LAB — BASIC METABOLIC PANEL
Anion Gap: 10 (ref 7–16)
BUN: 19 mg/dL — ABNORMAL HIGH (ref 7–18)
Calcium, Total: 7.6 mg/dL — ABNORMAL LOW (ref 8.5–10.1)
Chloride: 108 mmol/L — ABNORMAL HIGH (ref 98–107)
Co2: 25 mmol/L (ref 21–32)
Creatinine: 1.11 mg/dL (ref 0.60–1.30)
EGFR (African American): 59 — ABNORMAL LOW
EGFR (Non-African Amer.): 51 — ABNORMAL LOW
Glucose: 151 mg/dL — ABNORMAL HIGH (ref 65–99)
Osmolality: 290 (ref 275–301)
Potassium: 4.3 mmol/L (ref 3.5–5.1)
Sodium: 143 mmol/L (ref 136–145)

## 2011-12-11 LAB — HEMOGLOBIN: HGB: 10.1 g/dL — ABNORMAL LOW (ref 12.0–16.0)

## 2011-12-11 LAB — PLATELET COUNT: Platelet: 201 10*3/uL (ref 150–440)

## 2011-12-14 LAB — PATHOLOGY REPORT

## 2012-01-19 ENCOUNTER — Encounter: Payer: Self-pay | Admitting: Pulmonary Disease

## 2012-01-19 ENCOUNTER — Ambulatory Visit (INDEPENDENT_AMBULATORY_CARE_PROVIDER_SITE_OTHER): Payer: Medicare Other | Admitting: Pulmonary Disease

## 2012-01-19 VITALS — BP 122/64 | HR 86 | Temp 98.0°F | Ht 64.0 in | Wt 130.0 lb

## 2012-01-19 DIAGNOSIS — J449 Chronic obstructive pulmonary disease, unspecified: Secondary | ICD-10-CM

## 2012-01-19 DIAGNOSIS — K922 Gastrointestinal hemorrhage, unspecified: Secondary | ICD-10-CM

## 2012-01-19 MED ORDER — TIOTROPIUM BROMIDE MONOHYDRATE 18 MCG IN CAPS: 18.0000 ug | ORAL_CAPSULE | Freq: Every day | RESPIRATORY_TRACT | Status: DC

## 2012-01-19 NOTE — Assessment & Plan Note (Signed)
Kerri Carter has been experiencing consistent (but low volume) black tarry stools on the Xarelto.  She stopped the drug a week ago but this symptom persists to some degree.  She is hemodynamically stable and doesn't have evidence of brisk bleeding.  She tells me that she has a history of ulcer disease and has been followed by Dr. Markham Jordan in the past.  We will call his office for an appointment.  We instructed her to go to an Emergency room if the problem gets worse.

## 2012-01-19 NOTE — Progress Notes (Signed)
Subjective:    Patient ID: Kerri Carter, female    DOB: [birth year], 71 y.o.   MRN: [id number]  Synopsis: Kerri Carter established care for COPD at the [facility] LB office in 08/2011.  Had PFTs in 04/2011 in March 2013 which showed: Ratio 70%, clear obstruction on flow volume loop, FEV1 1.79 L (81% predicted.), Total lung capacity 5.3 L (115% predicted), residual volume 2.49 L (136% predicted), DLCO 84% predicted. She smoked 0.5packs per day for 20 years.    HPI  01/19/12 ROV -- Since our last visit [name] had a L hip fracture and required ORIF.  She is still participating in rehab with PT.  Her breathing is doing great and she is only using the [facility] every so often.  She has not had cough or worsening shortness of breath.  She had a flu shot in October.  She is still using a walker in the house.     She has had some black tarry stool for the last week. She stopped taking the Xarelto because she was worried this was related.  This has been low volume and not associated with pain or vomiting.    Past Medical History  Diagnosis Date  . Hypothyroidism   . HTN (hypertension)   . CRI (chronic renal insufficiency)   . Hyperlipidemia   . Diabetes mellitus   . Asthma   . Angioedema   . Anaphylaxis   . Osteoarthritis     hands, low titer rheumatoid factor, carpal tunnel  . Carpal tunnel syndrome      Review of Systems  Constitutional: Positive for activity change. Negative for chills and fatigue.  Respiratory: Negative for cough, shortness of breath and stridor.   Cardiovascular: Negative for chest pain, palpitations and leg swelling.       Objective:   Physical Exam  Filed Vitals:   01/19/12 1046  BP: 122/64  Pulse: 86  Temp: 98 F (36.7 C)  TempSrc: Oral  Height: 5\' 4"  (1.626 m)  Weight: 130 lb (58.968 kg)  SpO2: 98%   Gen: well appearing, no acute distress HEENT: NCAT, PERRL, EOMi, OP clear, neck supple without masses PULM: CTA B CV: RRR, no mgr, no JVD AB: BS+, soft,  nontender, no hsm Ext: warm, no edema, no clubbing, no cyanosis      Assessment & Plan:   GI bleeding Kerri Carter has been experiencing consistent (but low volume) black tarry stools on the Xarelto.  She stopped the drug a week ago but this symptom persists to some degree.  She is hemodynamically stable and doesn't have evidence of brisk bleeding.  She tells me that she has a history of ulcer disease and has been followed by Dr. Markham Jordan in the past.  We will call his office for an appointment.  We instructed her to go to an Emergency room if the problem gets worse.  COPD (chronic obstructive pulmonary disease) Despite the hip surgery, she has been doing well.  In the winter of 2012 she had frequent exacerbations, so she needs to be on a bronchodilator daily to help prevent exacerbations.   Plan: -stop dulera -start Spiriva (better exacerbation prevention data, easy to use) -O2 at night -if dyspnea worse after completing PT/rehab from hip surgery, will refer back to pulmonary rehab -f/u three months    Updated Medication List Outpatient Encounter Prescriptions as of 01/19/2012  Medication Sig Dispense Refill  . albuterol (VENTOLIN HFA) 108 (90 BASE) MCG/ACT inhaler Inhale 2 puffs into the lungs every 6 (  six) hours as needed.      Marland Kitchen aspirin 81 MG tablet Take 81 mg by mouth daily.      . carvedilol (COREG) 3.125 MG tablet Take 3.125 mg by mouth 2 (two) times daily with a meal.      . furosemide (LASIX) 20 MG tablet 1/2 tab by mouth every morning      . HYDROcodone-acetaminophen (VICODIN) 5-500 MG per tablet Take 1 tablet by mouth every 8 (eight) hours as needed.      . hydroxychloroquine (PLAQUENIL) 200 MG tablet Take 1 tablet by mouth 2 (two) times daily.       Marland Kitchen lovastatin (ALTOPREV) 20 MG 24 hr tablet Take 20 mg by mouth at bedtime.      . montelukast (SINGULAIR) 10 MG tablet Take 10 mg by mouth at bedtime.      . pantoprazole (PROTONIX) 40 MG tablet Take 40 mg by mouth 2 (two) times daily.        . potassium chloride (K-DUR,KLOR-CON) 10 MEQ tablet Take 10 mEq by mouth daily. (with lasix)      . QUEtiapine (SEROQUEL) 50 MG tablet Take 50 mg by mouth at bedtime.      . saccharomyces boulardii (FLORASTOR) 250 MG capsule Take 250 mg by mouth as directed.      . zolpidem (AMBIEN CR) 12.5 MG CR tablet Take 12.5 mg by mouth at bedtime as needed.      . [DISCONTINUED] Mometasone Furo-Formoterol Fum (DULERA) 200-5 MCG/ACT AERO Inhale 2 puffs into the lungs 2 (two) times daily as needed.       . tiotropium (SPIRIVA HANDIHALER) 18 MCG inhalation capsule Place 1 capsule (18 mcg total) into inhaler and inhale daily.  30 capsule  2

## 2012-01-19 NOTE — Patient Instructions (Signed)
Stop dulera Start Spiriva Don't take Atrovent (ipratropium) with the Spiriva We will see you back in three months  If you are feeling more short of breath after you have completed your rehab, we can refer you back to pulmonary rehab; call us if you are not improved  We will call your GI doctor for you regarding your black stools  We will see you back in 3 months

## 2012-01-19 NOTE — Assessment & Plan Note (Signed)
Despite the hip surgery, she has been doing well.  In the winter of 2012 she had frequent exacerbations, so she needs to be on a bronchodilator daily to help prevent exacerbations.   Plan: -stop dulera -start Spiriva (better exacerbation prevention data, easy to use) -O2 at night -if dyspnea worse after completing PT/rehab from hip surgery, will refer back to pulmonary rehab -f/u three months

## 2012-02-21 IMAGING — CR DG CHEST 2V
1 series · 2 of 2 positions shown · non-contrast
Comparison: none

REASON FOR EXAM: cough
COMMENTS:

PROCEDURE:     DXR - DXR CHEST PA (OR AP) AND LATERAL  - February 22, 2011  [DATE]
RESULT:
The lungs are hyperinflated. No focal regions of consolidation or focal
infiltrates are appreciated. The cardiac silhouette and visualized skeleton
are unremarkable.

[Series 1: w chest pa · 0.14mm/px · 2 of 2 slices shown]
[im 1/2]
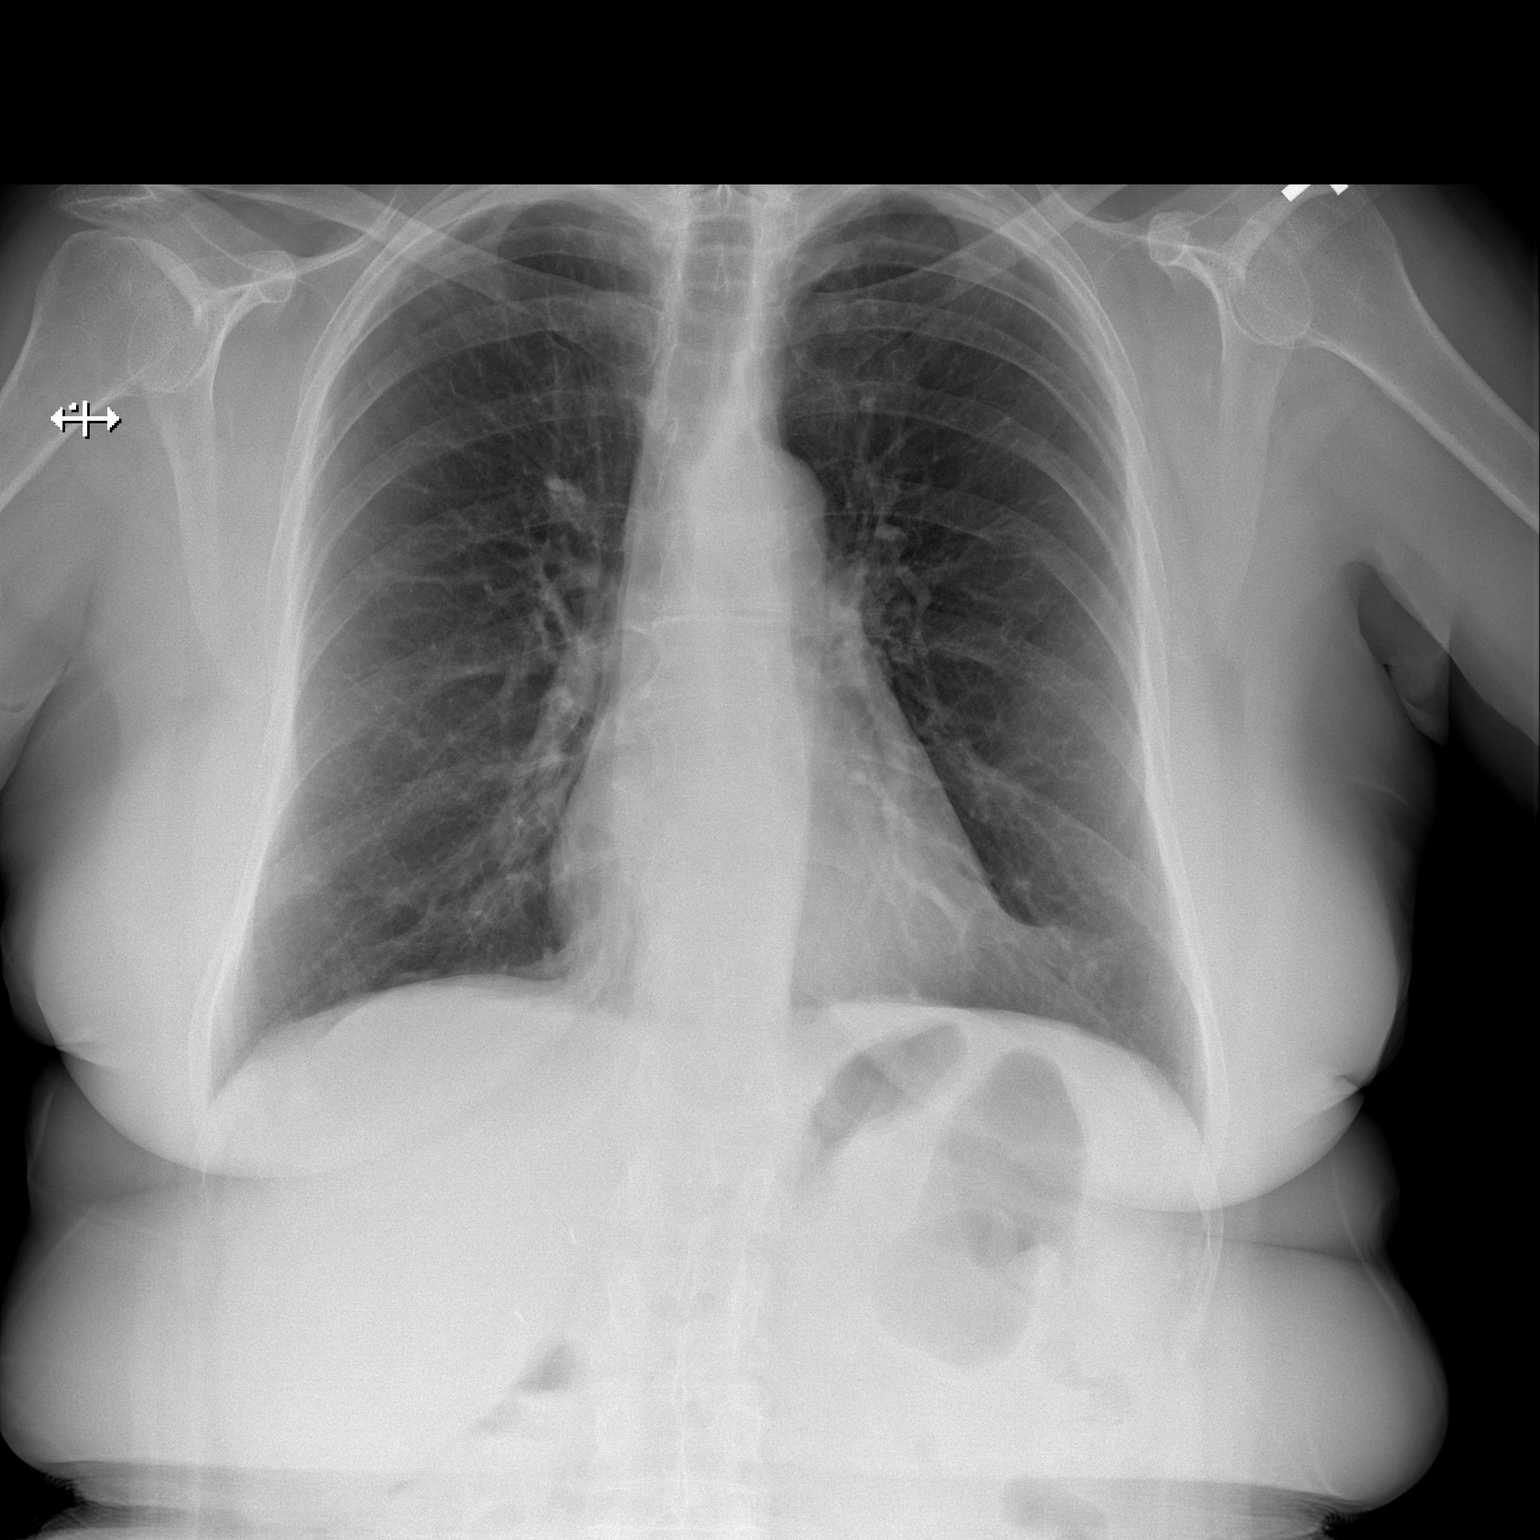
[im 2/2]
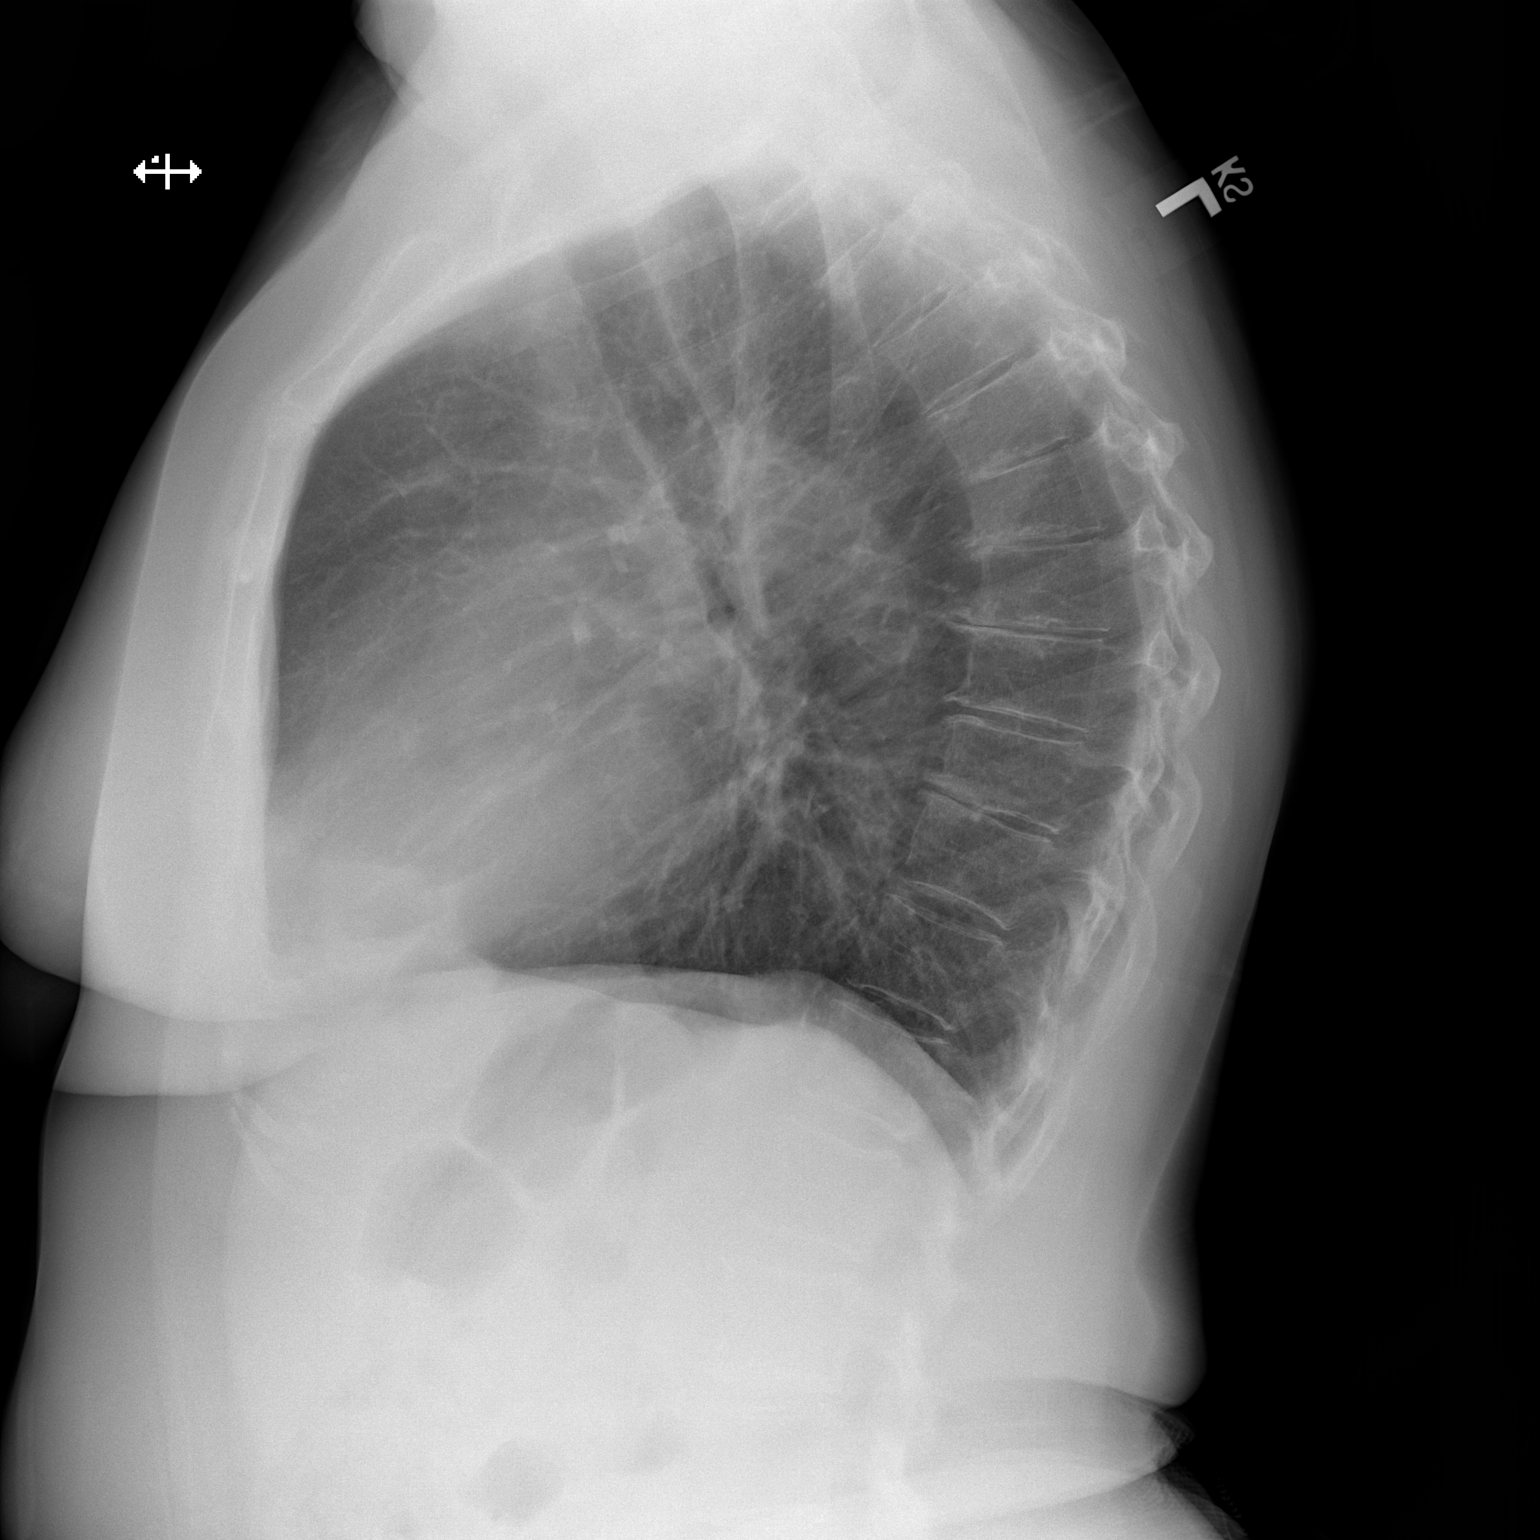

[2 of 2 positions shown; findings below may reference images not displayed]

IMPRESSION: COPD without evidence of acute cardiopulmonary disease.

## 2012-02-28 IMAGING — CT CT ABD-PELV W/O CM
1 of 2 series · 15 of 32 positions shown, 19 images · non-contrast
Comparison: none

REASON FOR EXAM: (1) ABD PAIN; (2) N/V/D
COMMENTS:   May transport without cardiac monitor

PROCEDURE:     CT  - CT ABDOMEN AND PELVIS W[DATE]  [DATE]
RESULT:
TECHNIQUE: Helical noncontrasted 3 mm sections were obtained from the lung
bases through the pubic symphysis.

[Series 2: 3mm soft tissue · axial · 0.75mm/px · z∈[-752,-365]mm · 15 of 141 slices shown, 19 images]
[im 6/141  soft-tissue]
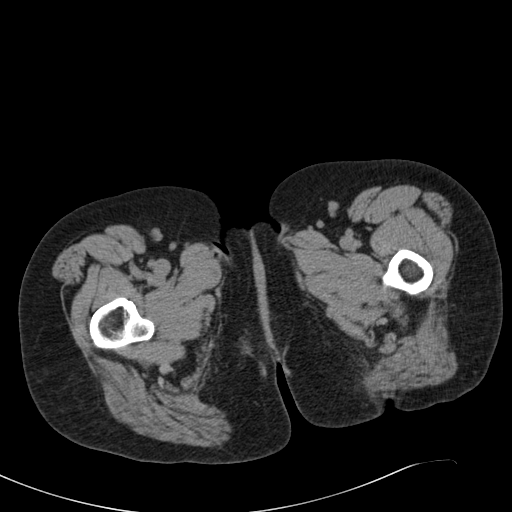
[im 6/141  bone]
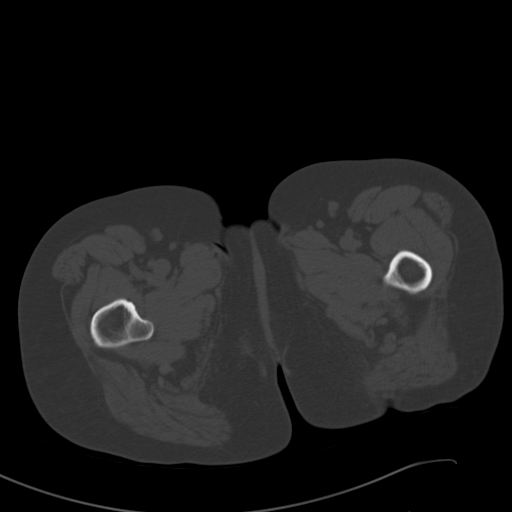
[im 17/141  soft-tissue]
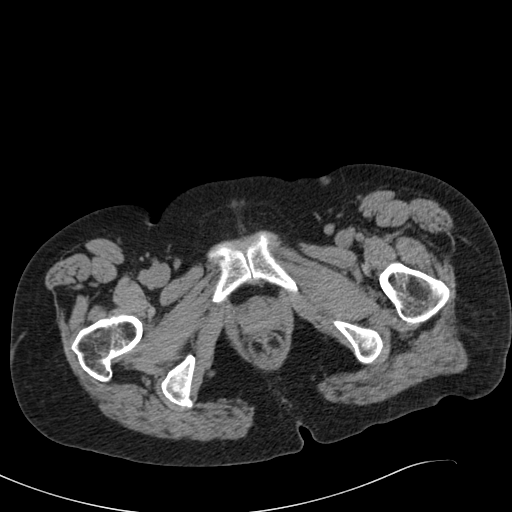
[im 29/141  soft-tissue]
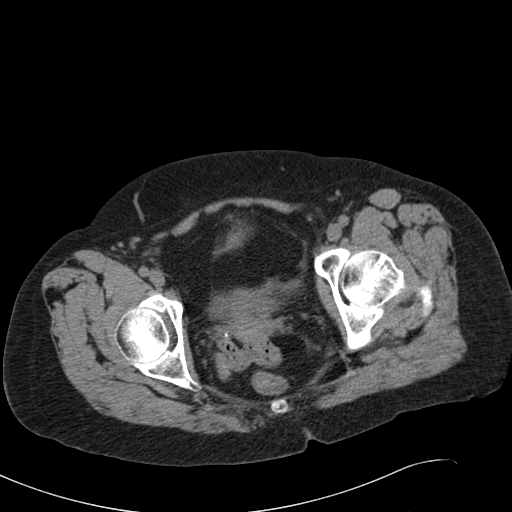
[im 40/141  soft-tissue]
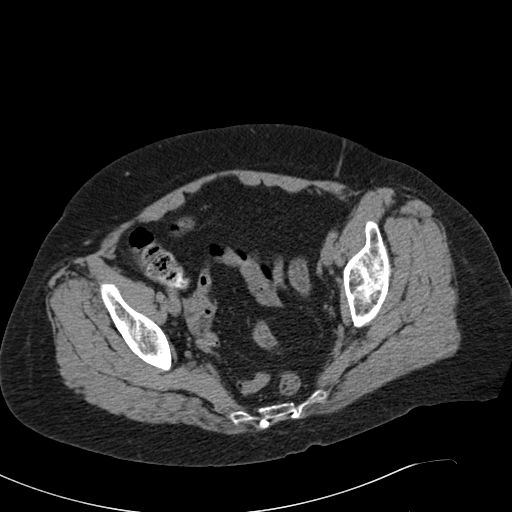
[im 51/141  soft-tissue]
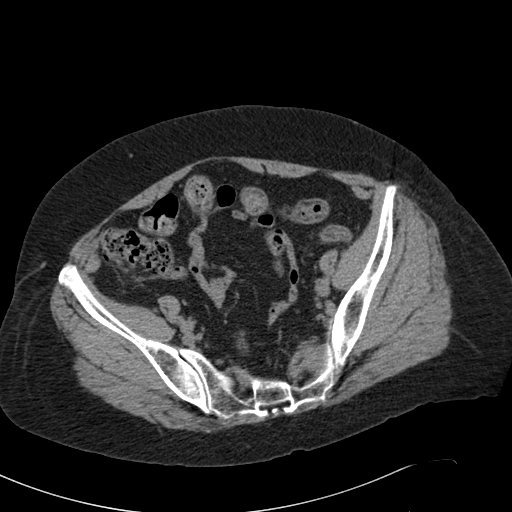
[im 62/141  soft-tissue]
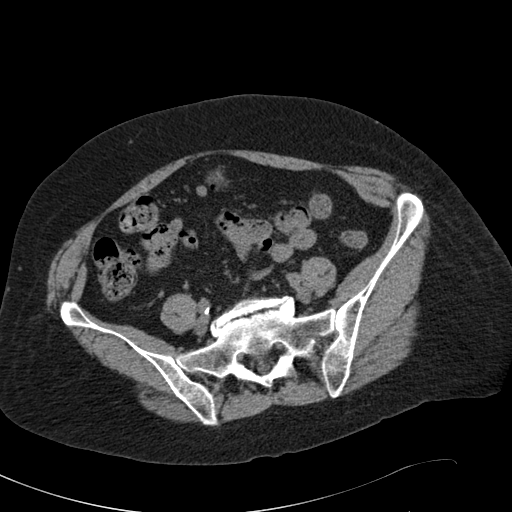
[im 73/141  soft-tissue]
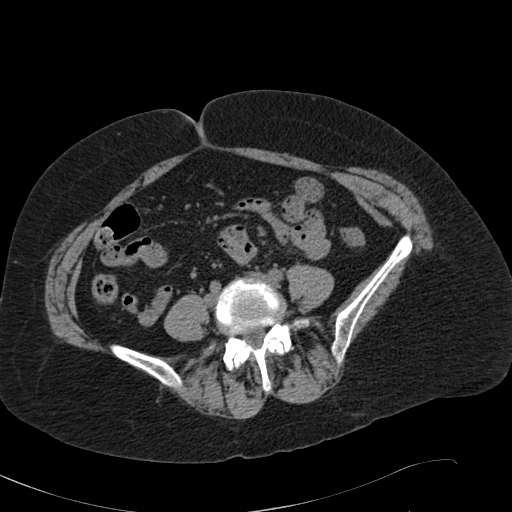
[im 79/141  soft-tissue]
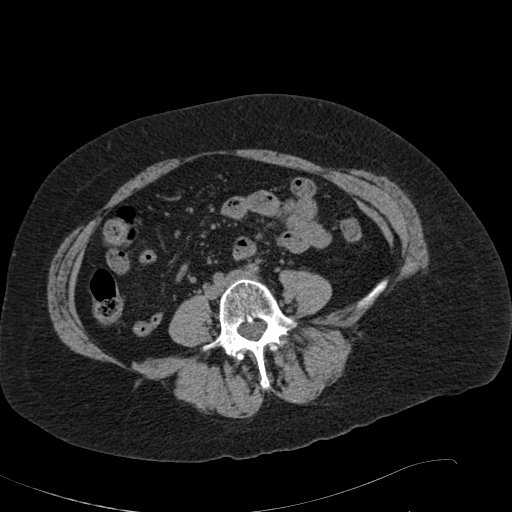
[im 90/141  soft-tissue]
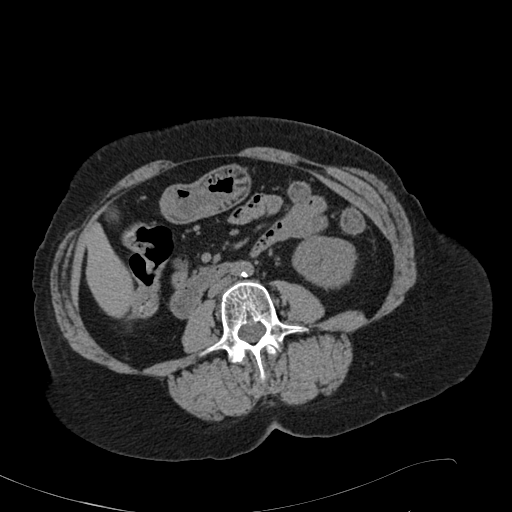
[im 90/141  bone]
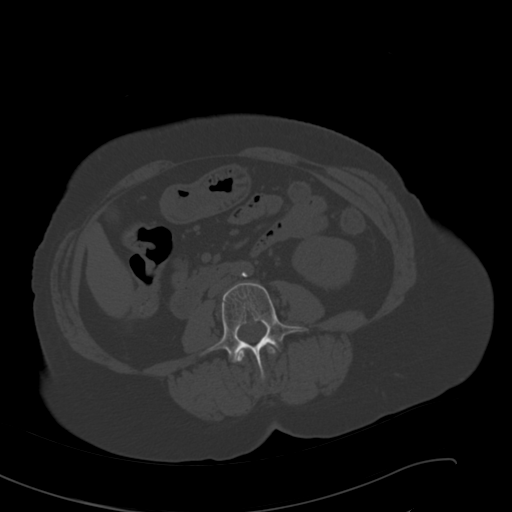
[im 101/141  soft-tissue]
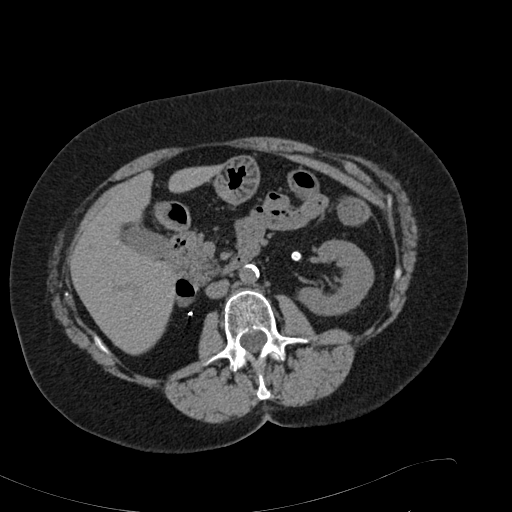
[im 113/141  soft-tissue]
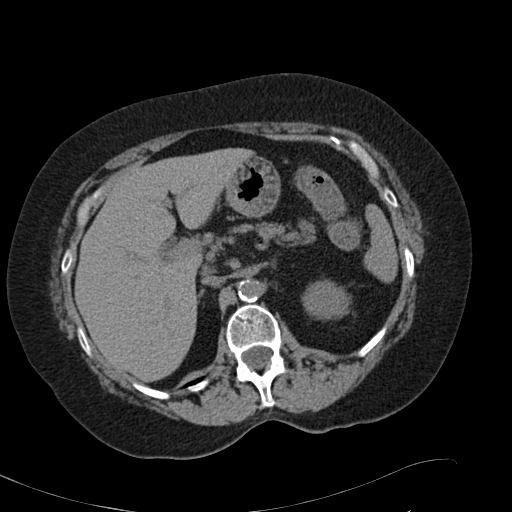
[im 118/141  lung]
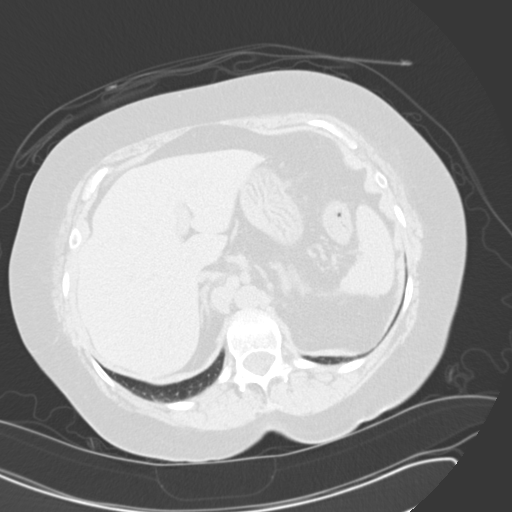
[im 124/141  soft-tissue]
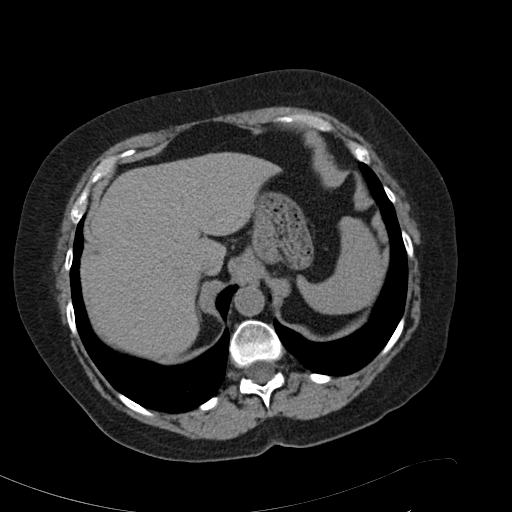
[im 124/141  lung]
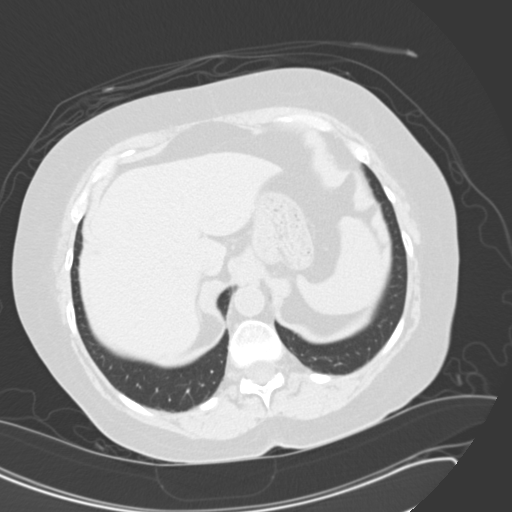
[im 129/141  lung]
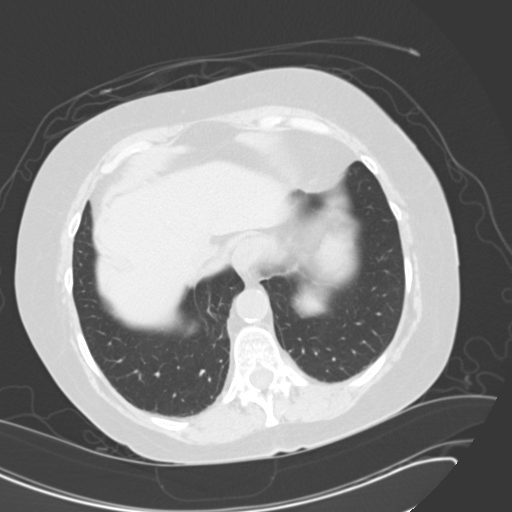
[im 135/141  soft-tissue]
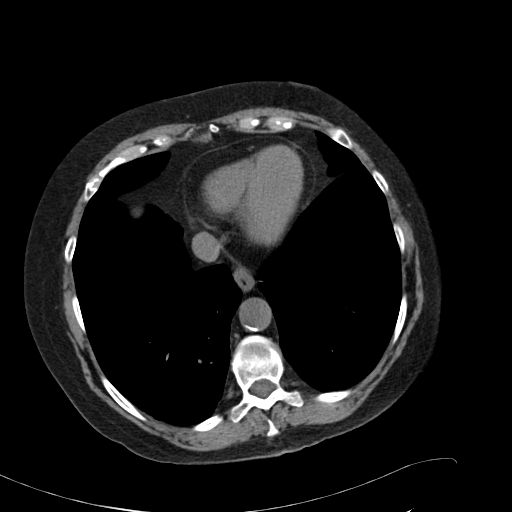
[im 135/141  lung]
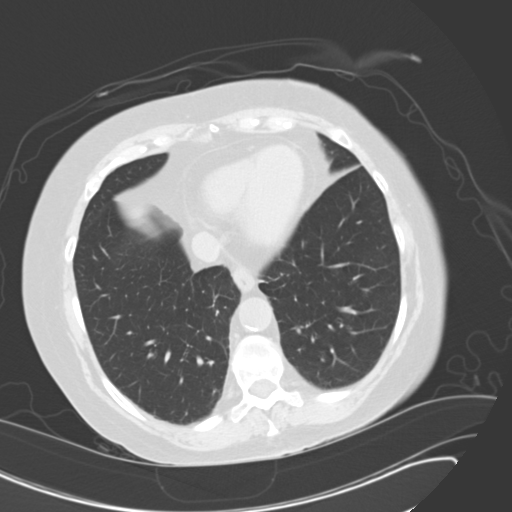

[15 of 32 positions shown; findings below may reference images not displayed]

FINDINGS: The lung bases are unremarkable.

Noncontrast evaluation of the liver, spleen, adrenals, and pancreas are
unremarkable. There is no evidence of nephrolithiasis involving the left
kidney nor evidence of hydronephrosis, nor ureterolithiasis. The right
kidney is absent, likely surgically. Clips project within the right renal
fossa.

The colon demonstrates findings concerning for bowel wall thickening
extending from the transverse colon to the descending colon. Alternatively,
this finding may represent non-distention. No free fluid or drainable
loculated fluid collections are identified. There is no evidence of an
abdominal aortic aneurysm. There is no evidence of bowel obstruction.
IMPRESSION: 1. Findings raising the suspicion of colitis involving the transverse and
descending colon. Alternatively, this finding may represent non-distention.
Etiology such as infection versus inflammatory or possibly even vascular
compromise is of diagnostic consideration.
2. Dr. Ceejay of the Emergency Department was informed of these findings via a
preliminary faxed report.

## 2012-03-19 IMAGING — CT CT CHEST W/O CM
1 series · 16 of 33 positions shown, 20 images · non-contrast
Comparison: none

REASON FOR EXAM: SOB, hypoxia
COMMENTS:

[Series 2: soft tissue · axial · 0.62mm/px · z∈[+772,+1050]mm · 16 of 101 slices shown, 20 images]
[im 4/101  mediastinal]
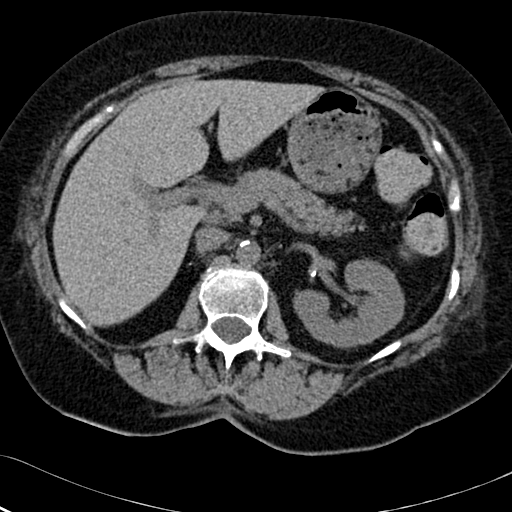
[im 4/101  lung]
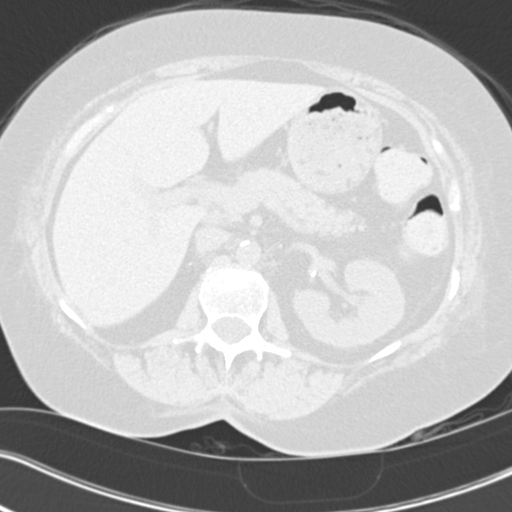
[im 12/101  lung]
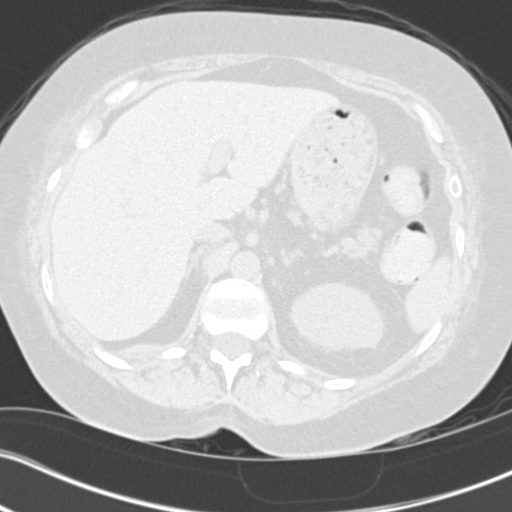
[im 19/101  lung]
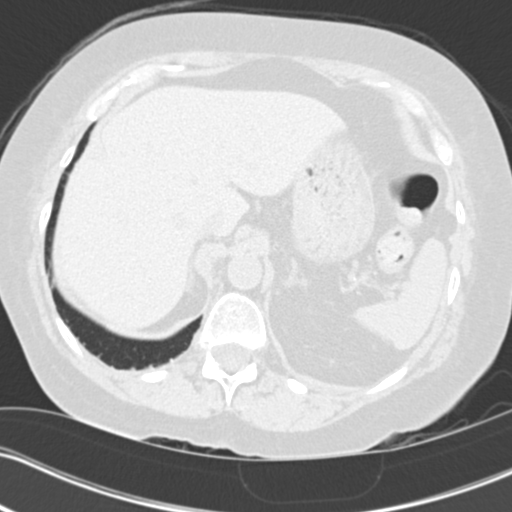
[im 23/101  lung]
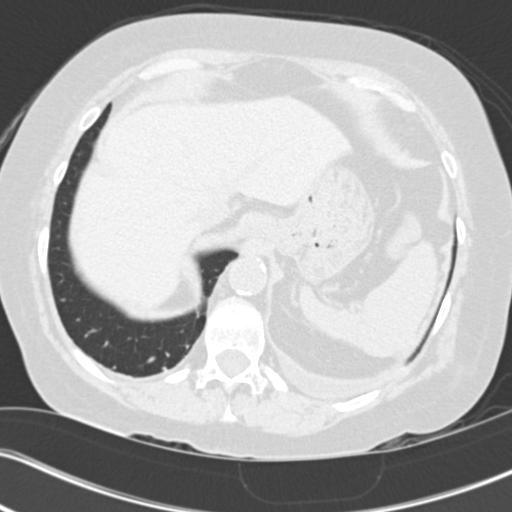
[im 30/101  mediastinal]
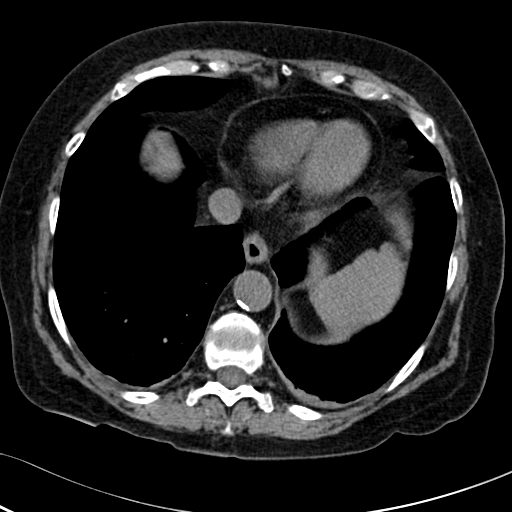
[im 30/101  lung]
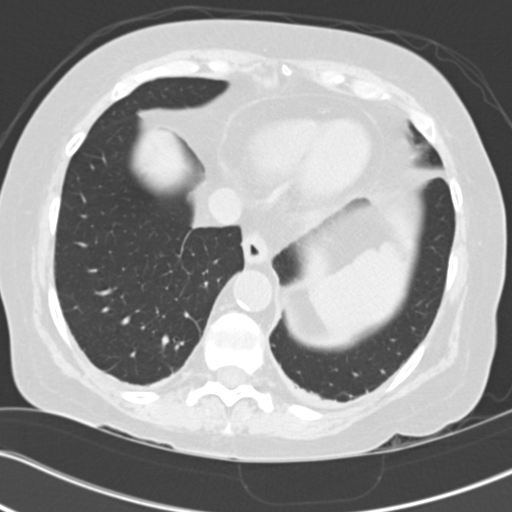
[im 38/101  lung]
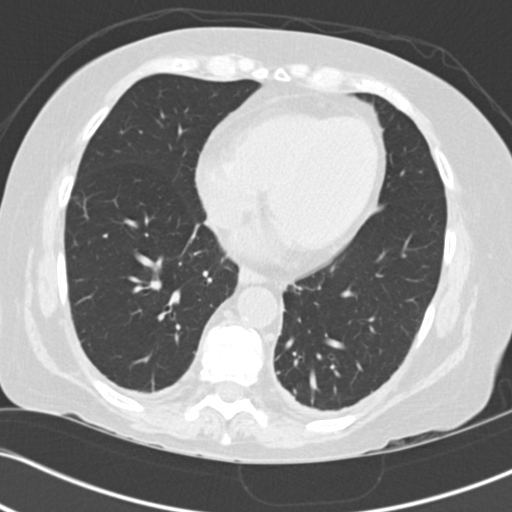
[im 45/101  lung]
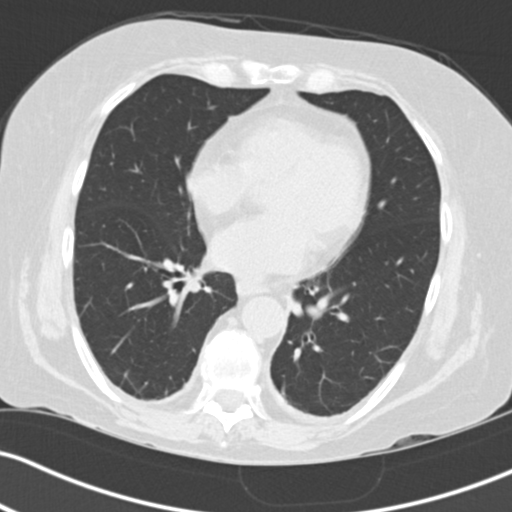
[im 49/101  lung]
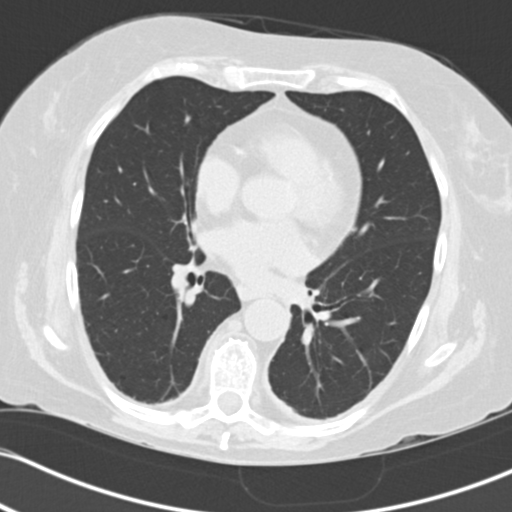
[im 54/101  mediastinal]
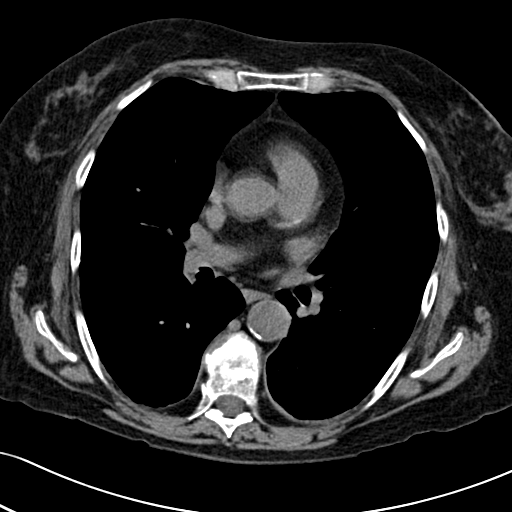
[im 54/101  lung]
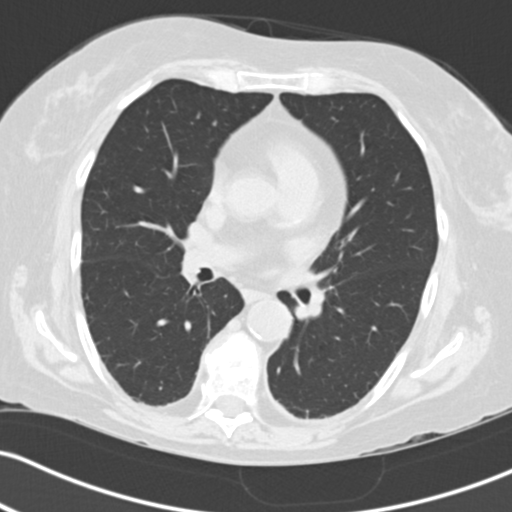
[im 60/101  lung]
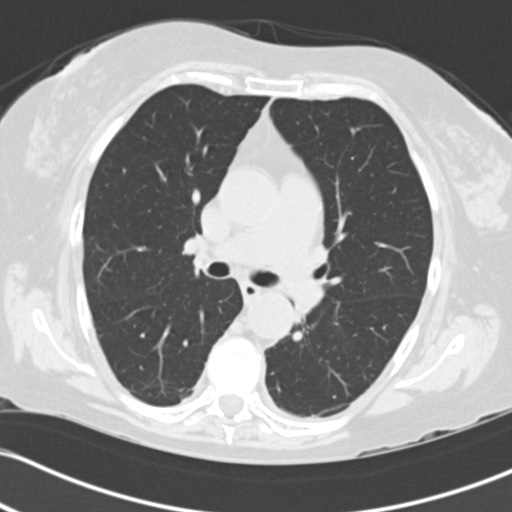
[im 63/101  lung]
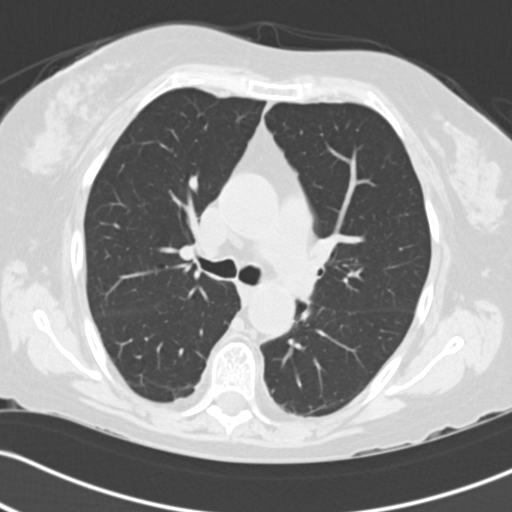
[im 71/101  lung]
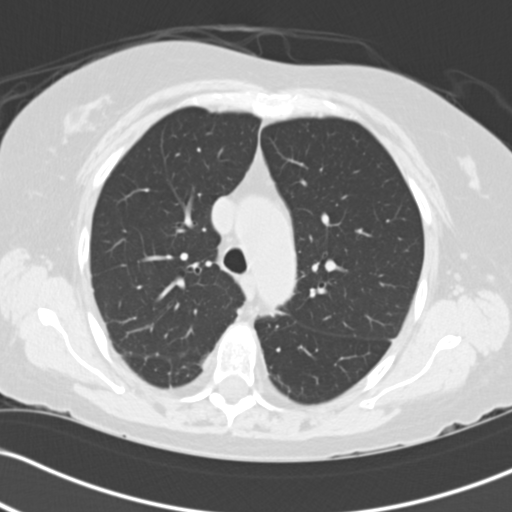
[im 78/101  mediastinal]
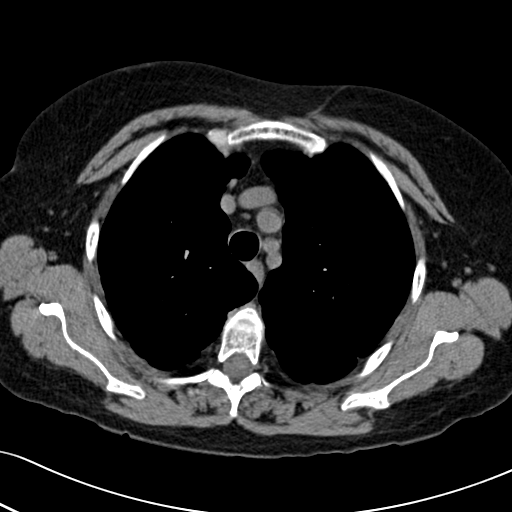
[im 78/101  lung]
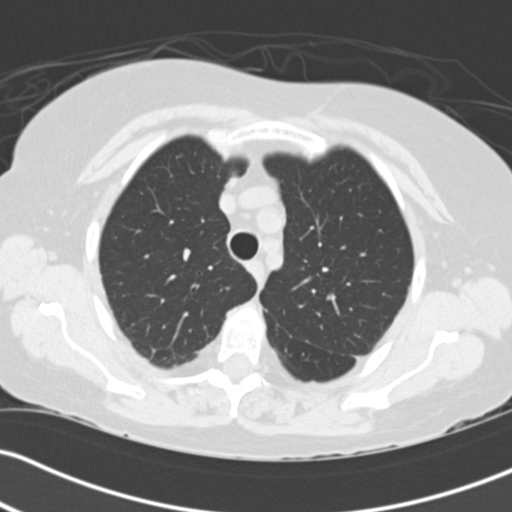
[im 82/101  lung]
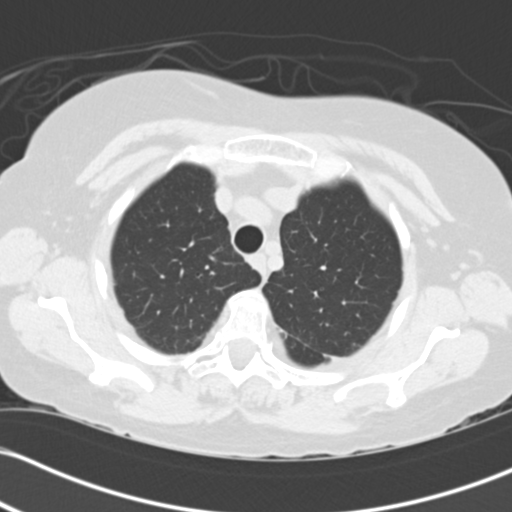
[im 89/101  lung]
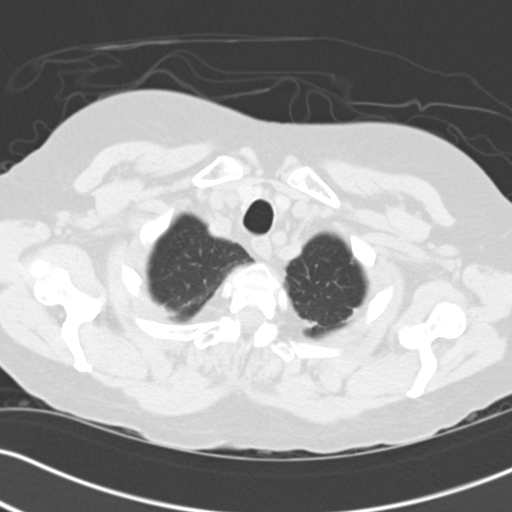
[im 97/101  lung]
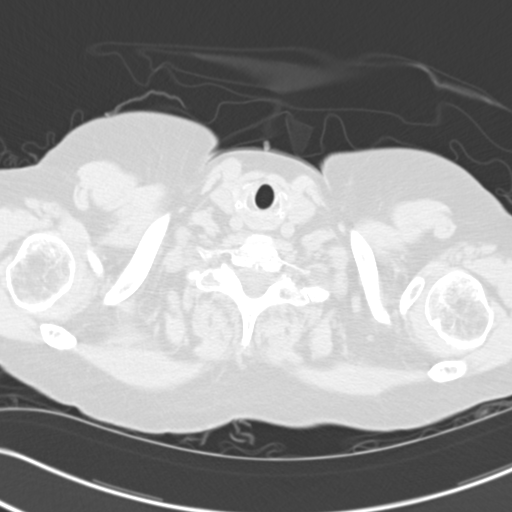

[16 of 33 positions shown; findings below may reference images not displayed]

PROCEDURE:     CT  - CT CHEST WITHOUT CONTRAST  - March 21, 2011 [DATE]

RESULT:     Noncontrast CT of the chest is compared to the previous exam
dated 13 February, 2009.

The lungs are clear. There is minimal pleural effusion or pleural thickening
at the left lung base. There is no mediastinal or hilar mass or adenopathy.
No pericardial effusion is evident. The heart size is normal. Some coronary
artery atherosclerotic calcification is noted in the LAD and circumflex
arteries. The included upper abdominal structures appear unremarkable. The
thoracic aorta appears normal in caliber.
IMPRESSION: No acute cardiopulmonary disease evident aside from some
minimal pleural thickening or pleural effusion at the left lung base.

## 2012-03-21 IMAGING — CR DG CHEST 1V
1 series · 1 of 1 positions shown · non-contrast
Comparison: none

REASON FOR EXAM: SOB
COMMENTS:

PROCEDURE:     DXR - DXR CHEST 1 VIEWAP OR PA  - March 23, 2011  [DATE]
RESULT:     Comparison is made to the prior exam of 02/22/2011. The lung
fields are clear. The heart, mediastinal and structures show no significant
abnormalities.

[ap]
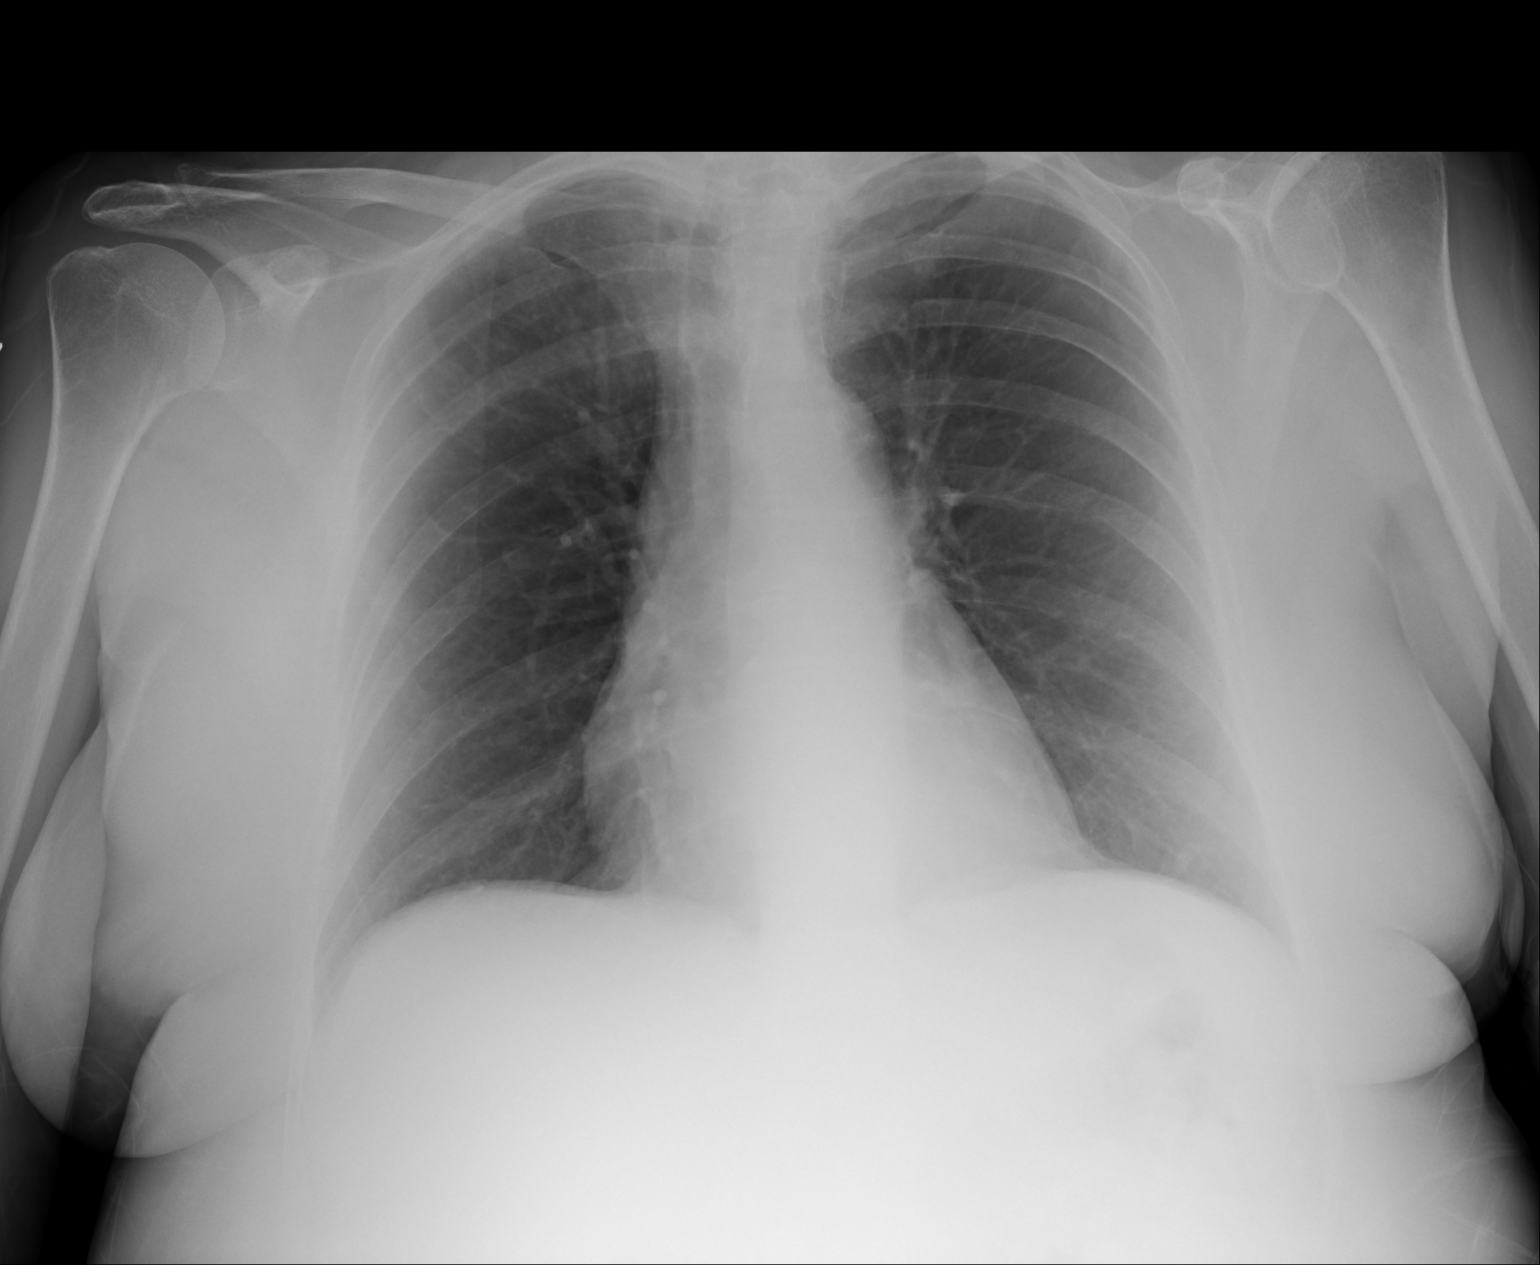

[1 of 1 positions shown; findings below may reference images not displayed]

IMPRESSION: No acute changes are identified.

## 2012-03-21 IMAGING — NM NM LUNG SCAN
2 series · 16 of 16 positions shown · non-contrast
Comparison: none

REASON FOR EXAM: SOB, elevated d-dimer
COMMENTS:

[Series 1000: lung perfusion · 1.95mm/px · 4 acquisitions, 8 frames shown]
[im 1/4]
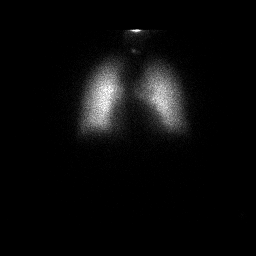
[im 1/4]
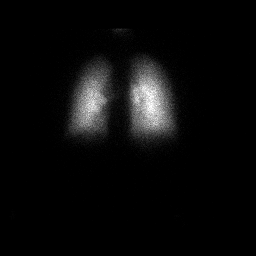
[im 2/4]
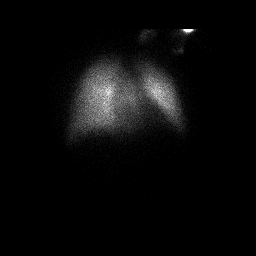
[im 2/4]
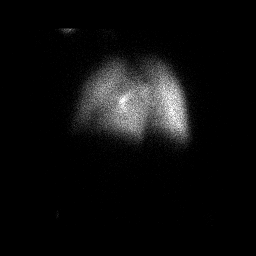
[im 3/4]
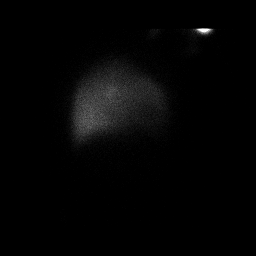
[im 3/4]
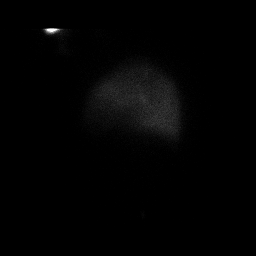
[im 4/4]
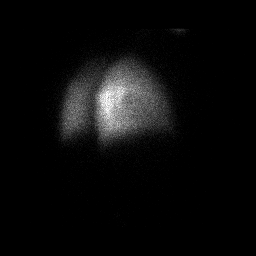
[im 4/4]
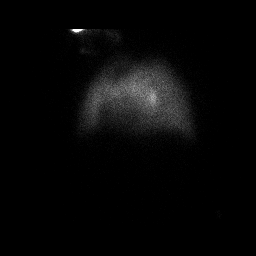

[Series 1000: lung ventilation · 3.90mm/px · 4 acquisitions, 8 frames shown]
[im 1/4]
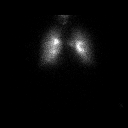
[im 1/4]
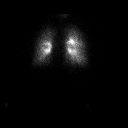
[im 2/4]
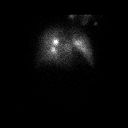
[im 2/4]
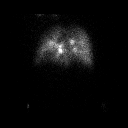
[im 3/4]
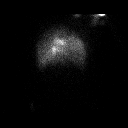
[im 3/4]
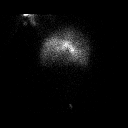
[im 4/4]
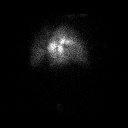
[im 4/4]
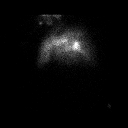

[16 of 16 positions shown; findings below may reference images not displayed]

PROCEDURE:     NM  - NM VQ LUNG SCAN  - [DATE] [DATE] [DATE]  [DATE]

RESULT:     Following aerosol ministration of 41.92 mCi technetium 99m DTPA,
ventilation lung scan was performed in 8 views. After the ventilation scan
and following intravenous administration of 4.438 mCi technetium 99m
macroaggregated albumin, perfusion lung scan was performed an 8 views. No
perfusion defects suspicious for pulmonary emboli identified.
IMPRESSION: 1. Ventilation perfusion lung scan is low probability for pulmonary muffs.

## 2012-04-14 ENCOUNTER — Ambulatory Visit (INDEPENDENT_AMBULATORY_CARE_PROVIDER_SITE_OTHER): Payer: Medicare Other | Admitting: Internal Medicine

## 2012-04-14 ENCOUNTER — Encounter: Payer: Self-pay | Admitting: Internal Medicine

## 2012-04-14 VITALS — BP 166/83 | HR 85 | Temp 97.4°F | Ht 64.0 in | Wt 133.0 lb

## 2012-04-14 DIAGNOSIS — J449 Chronic obstructive pulmonary disease, unspecified: Secondary | ICD-10-CM

## 2012-04-14 DIAGNOSIS — I10 Essential (primary) hypertension: Secondary | ICD-10-CM

## 2012-04-14 MED ORDER — HYDROCODONE-ACETAMINOPHEN 5-500 MG PO TABS: 1.0000 | ORAL_TABLET | ORAL | Status: DC | PRN

## 2012-04-14 MED ORDER — ALBUTEROL SULFATE HFA 108 (90 BASE) MCG/ACT IN AERS: 2.0000 | INHALATION_SPRAY | Freq: Four times a day (QID) | RESPIRATORY_TRACT | Status: DC | PRN

## 2012-04-14 NOTE — Progress Notes (Signed)
  Subjective:    Patient ID: Kerri Carter, female    DOB: 10-Oct-1940 .   MRN: [id number]  Synopsis: Kerri Carter established care for COPD at the [facility] office in 08/2011.  Had PFTs in 04/2011 in March 2013 which showed: Ratio 70%, clear obstruction on flow volume loop, FEV1 1.79 L (81% predicted.), Total lung capacity 5.3 L (115% predicted), residual volume 2.49 L (136% predicted), DLCO 84% predicted. She smoked 0.5packs per day for 20 years but quit smoking  1976 at baseline activity = anything desired maintained on singulair / spiriva     HPI 01/19/12 ROV -- Since  last visit Kerri Carter had a L hip fracture and required ORIF.  She is still participating in rehab with PT.  Her breathing is doing great and she is only using the [facility] every so often.  She has not had cough or worsening shortness of breath.  She had a flu shot in October.  She is still using a walker in the house.     04/14/2012 f/u ov/Wer  still on prednisone 20 mg  c/o increased SOB, CP on inspiraiton, and non prod cough x 3 wks, has been txed with multiple abx and prednisone taper and no better when using albuterol just finished zpak no longer bringing up much mucus but harsh couging fits on spiriva maint rx.   No obvious daytime variabilty or assoc   chest tightness, subjective wheeze overt sinus or hb symptoms. No unusual exp hx or h/o childhood pna/ asthma or premature birth to her knowledge.   Sleeping ok without nocturnal  or early am exacerbation  of respiratory  c/o's or need for noct saba. Also denies any obvious fluctuation of symptoms with weather or environmental changes or other aggravating or alleviating factors except as outlined above   ROS  The following are not active complaints unless bolded sore throat, dysphagia, dental problems, itching, sneezing,  nasal congestion or excess/ purulent secretions, ear ache,   fever, chills, sweats, unintended wt loss, pleuritic or exertional cp, hemoptysis,  orthopnea pnd or  leg swelling, presyncope, palpitations, heartburn, abdominal pain, anorexia, nausea, vomiting, diarrhea  or change in bowel or urinary habits, change in stools or urine, dysuria,hematuria,  rash, arthralgias, visual complaints, headache, numbness weakness or ataxia or problems with walking or coordination,  change in mood/affect or memory.                Objective:   Physical Exam  Amb wf with very harsh cough but able to speak in full sentences  Wt Readings from Last 3 Encounters:  04/14/12 133 lb (60.328 kg)  01/19/12 130 lb (58.968 kg)  09/09/11 157 lb (71.215 kg)     HEENT mild turbinate edema.  Oropharynx no thrush or excess pnd or cobblestoning.  No JVD or cervical adenopathy. Mild accessory muscle hypertrophy. Trachea midline, nl thryroid. Chest was hyperinflated by percussion with diminished breath sounds and moderate increased exp time without wheeze. Hoover sign positive at mid inspiration. Regular rate and rhythm without murmur gallop or rub or increase P2 or edema.  Abd: no hsm, nl excursion. Ext warm without cyanosis or clubbing.        Assessment & Plan:

## 2012-04-14 NOTE — Patient Instructions (Addendum)
Change the protonix to where you take Take 30- 60 min before your first and last meals of the day  And at pepcid 20 mg one at bedtime as long as you are coughing  For cough use vicodin every 4 hours  Finish prednisone  Stop spiriva and take symbicort one puff twice daily and work on your on your technique smooth and deep  Stop coreg and start bystolic 10 mg one daily    GERD (REFLUX)  is an extremely common cause of respiratory symptoms, many times with no significant heartburn at all.    It can be treated with medication, but also with lifestyle changes including avoidance of late meals, excessive alcohol, smoking cessation, and avoid fatty foods, chocolate, peppermint, colas, red wine, and acidic juices such as orange juice.  NO MINT OR MENTHOL PRODUCTS SO NO COUGH DROPS  USE SUGARLESS CANDY INSTEAD (jolley ranchers or Stover's)  NO OIL BASED VITAMINS - use powdered substitutes.  Keep your appointment with Dr Victorino Sparrow

## 2012-04-15 NOTE — Assessment & Plan Note (Addendum)
Strongly prefer in this setting: Bystolic, the most beta -1  selective Beta blocker available in sample form, with bisoprolol the most selective generic choice  on the market, at least in the short run (samples of bystolic 10 given)

## 2012-04-15 NOTE — Assessment & Plan Note (Signed)
March 2013 full PFT at [facility] clinic: Ratio 70%, clear obstruction on flow volume loop, FEV1 1.79 L (81% predicted.), Total lung capacity 5.3 L (115% predicted), residual volume 2.49 L (136% predicted), DLCO 84% predicted 09/09/2011 m MRC score 1 Spring-summer 2013: pulmonary rehabilitation at [facility], June 2013 6 minute walk 1082 feet, SaO2 100% on room air peak exercise heart rate 72 ONO 09/17/11: pos desat to 85% RA 09/22/2011 6MW>> 1160ft; exercise O2 saturation 96% RA  Symptoms are markedly disproportionate to objective findings and not clear this is flare is a  lung problem but pt does appear to have difficult airway management issues.   DDX of  difficult airways managment all start with A and  include Adherence, Ace Inhibitors, Acid Reflux, Active Sinus Disease, Alpha 1 Antitripsin deficiency, Anxiety masquerading as Airways dz,  ABPA,  allergy(esp in young), Aspiration (esp in elderly), Adverse effects of DPI,  Active smokers, plus two Bs  = Bronchiectasis and Beta blocker use..and one C= CHF   Adherence is always the initial "prime suspect" and is a multilayered concern that requires a "trust but verify" approach in every patient - starting with knowing how to use medications, especially inhalers, correctly, keeping up with refills and understanding the fundamental difference between maintenance and prns vs those medications only taken for a very short course and then stopped and not refilled. The proper method of use, as well as anticipated side effects, of a metered-dose inhaler are discussed and demonstrated to the patient. Improved effectiveness after extensive coaching during this visit to a level of approximately  75% so change to symbiocort 160 one bid (low dose to see if tolerates s making cough worse)  ? Acid reflux > rx max gerd rx  ? Adverse effect of dpi > try change to hfa  ? B blocker effect > trial of bystolic over coreg then regroup  See instructions for specific  recommendations which were reviewed directly with the patient who was given a copy with highlighter outlining the key components.

## 2012-04-19 ENCOUNTER — Ambulatory Visit: Payer: Self-pay | Admitting: Pulmonary Disease

## 2012-05-02 ENCOUNTER — Ambulatory Visit (INDEPENDENT_AMBULATORY_CARE_PROVIDER_SITE_OTHER): Payer: Medicare Other | Admitting: Pulmonary Disease

## 2012-05-02 ENCOUNTER — Encounter: Payer: Self-pay | Admitting: Pulmonary Disease

## 2012-05-02 VITALS — BP 132/78 | HR 70 | Temp 97.8°F | Ht 63.0 in | Wt 134.0 lb

## 2012-05-02 DIAGNOSIS — R05 Cough: Secondary | ICD-10-CM | POA: Insufficient documentation

## 2012-05-02 DIAGNOSIS — R059 Cough, unspecified: Secondary | ICD-10-CM | POA: Insufficient documentation

## 2012-05-02 DIAGNOSIS — G4733 Obstructive sleep apnea (adult) (pediatric): Secondary | ICD-10-CM

## 2012-05-02 DIAGNOSIS — J449 Chronic obstructive pulmonary disease, unspecified: Secondary | ICD-10-CM

## 2012-05-02 HISTORY — DX: Cough, unspecified: R05.9

## 2012-05-02 MED ORDER — NEBIVOLOL HCL 10 MG PO TABS: 10.0000 mg | ORAL_TABLET | Freq: Every day | ORAL | Status: DC

## 2012-05-02 NOTE — Patient Instructions (Signed)
Keep taking your medications as you are doing  We will see you back in 3-4 months or sooner if needed

## 2012-05-02 NOTE — Assessment & Plan Note (Addendum)
Since her last visit Kerri Carter has done quite well. She feels that her breathing is better on the Symbicort rather than the Spiriva but she has a hard time remembering it. I question whether or not she has an asthmatic component to her disease which is better treated by the Symbicort.  Plan: -Continue Symbicort twice a day

## 2012-05-02 NOTE — Assessment & Plan Note (Signed)
Kerri Carter's cough has improved greatly since changing the carvedilol to nebivolol and changing the Spiriva to Symbicort.  Plan: -Continue Spiriva -Continue Nebivolol  Notably, I believe that her cough is do to a prolonged effect from an upper respiratory infection. We have seen a lot of these this year. If her cardiologist feels that she should go back on the carvedilol I have no problem with this.

## 2012-05-02 NOTE — Progress Notes (Signed)
Subjective:    Patient ID: Kerri Carter, female    DOB: May 26, 1940, 72 y.o.   MRN: [id number]  Synopsis: [name] established care for COPD at the [facility] LB office in 08/2011.  Had PFTs in 04/2011 in March 2013 which showed: Ratio 70%, clear obstruction on flow volume loop, FEV1 1.79 L (81% predicted.), Total lung capacity 5.3 L (115% predicted), residual volume 2.49 L (136% predicted), DLCO 84% predicted. She smoked 0.5packs per day for 20 years.    HPI   01/19/12 ROV -- Since our last visit [name] had a L hip fracture and required ORIF.  She is still participating in rehab with PT.  Her breathing is doing great and she is only using the [facility] every so often.  She has not had cough or worsening shortness of breath.  She had a flu shot in October.  She is still using a walker in the house.     She has had some black tarry stool for the last week. She stopped taking the Xarelto because she was worried this was related.  This has been low volume and not associated with pain or vomiting.    3/13 Acute- Wert, cough; Rx PPI, change coreg to bystolic, change spiriva to Symbicort  3/31 ROV - Kerri Carter has done quite well since the last visit. Her cough is improved significantly and she is no longer short of breath. She states that she noticed a near immediate change in her breathing the night after she started Symbicort. She says that her blood pressure and heart rate have been well controlled on the Nebivolol. She continues to take her Protonix twice a day and has had minimal heartburn. She has no sinus congestion.   Past Medical History  Diagnosis Date  . Hypothyroidism   . HTN (hypertension)   . CRI (chronic renal insufficiency)   . Hyperlipidemia   . Diabetes mellitus   . Asthma   . Angioedema   . Anaphylaxis   . Osteoarthritis     hands, low titer rheumatoid factor, carpal tunnel  . Carpal tunnel syndrome      Review of Systems  Constitutional: Negative for chills, activity  change and fatigue.  Respiratory: Negative for cough, shortness of breath and stridor.   Cardiovascular: Negative for chest pain, palpitations and leg swelling.       Objective:   Physical Exam   Filed Vitals:   05/02/12 1117  BP: 132/78  Pulse: 70  Temp: 97.8 F (36.6 C)  TempSrc: Oral  Height: 5\' 3"  (1.6 m)  Weight: 134 lb (60.782 kg)  SpO2: 99%   Gen: well appearing, no acute distress HEENT: NCAT, EOMi, OP clear,  PULM: CTA B CV: RRR, no mgr, no JVD AB: BS+, soft, nontender, no hsm Ext: warm, no edema, no clubbing, no cyanosis      Assessment & Plan:   COPD (chronic obstructive pulmonary disease) Since her last visit [name] has done quite well. She feels that her breathing is better on the Symbicort rather than the Spiriva but she has a hard time remembering it. I question whether or not she has an asthmatic component to her disease which is better treated by the Symbicort.  Plan: -Continue Symbicort twice a day    Cough Kerri Carter's cough has improved greatly since changing the carvedilol to nebivolol and changing the Spiriva to Symbicort.  Plan: -Continue Spiriva -Continue Nebivolol  Notably, I believe that her cough is do to a prolonged effect from an  upper respiratory infection. We have seen a lot of these this year. If her cardiologist feels that she should go back on the carvedilol I have no problem with this.  OSA (obstructive sleep apnea) Continue CPAP with 2 L of oxygen at night.    Updated Medication List Outpatient Encounter Prescriptions as of 05/02/2012  Medication Sig Dispense Refill  . albuterol (VENTOLIN HFA) 108 (90 BASE) MCG/ACT inhaler Inhale 2 puffs into the lungs every 6 (six) hours as needed.  1 Inhaler  1  . aspirin 81 MG tablet Take 81 mg by mouth daily.      . chlorthalidone (HYGROTON) 25 MG tablet Take 12.5 mg by mouth daily.      Marland Kitchen HYDROcodone-acetaminophen (VICODIN) 5-500 MG per tablet Take 1 tablet by mouth every 4 (four) hours  as needed (cough).  30 tablet  0  . hydroxychloroquine (PLAQUENIL) 200 MG tablet Take 1 tablet by mouth 2 (two) times daily.       Marland Kitchen lovastatin (ALTOPREV) 20 MG 24 hr tablet Take 20 mg by mouth at bedtime.      . montelukast (SINGULAIR) 10 MG tablet Take 10 mg by mouth at bedtime.      . nebivolol (BYSTOLIC) 10 MG tablet Take 1 tablet (10 mg total) by mouth daily.  30 tablet  2  . pantoprazole (PROTONIX) 40 MG tablet Take 40 mg by mouth 2 (two) times daily.       . potassium chloride (K-DUR,KLOR-CON) 10 MEQ tablet Take 10 mEq by mouth daily.       . predniSONE (DELTASONE) 20 MG tablet Take 20 mg by mouth daily.      . QUEtiapine (SEROQUEL) 50 MG tablet Take 50 mg by mouth at bedtime.      . saccharomyces boulardii (FLORASTOR) 250 MG capsule Take 250 mg by mouth as directed.      . zolpidem (AMBIEN CR) 12.5 MG CR tablet Take 12.5 mg by mouth at bedtime as needed.       No facility-administered encounter medications on file as of 05/02/2012.

## 2012-05-02 NOTE — Assessment & Plan Note (Signed)
Continue CPAP with 2 L of oxygen at night.

## 2012-05-11 IMAGING — RF DG UGI W/ SMALL BOWEL
8 series · 14 of 23 positions shown · non-contrast
Comparison: none

REASON FOR EXAM: ab pain heme positive iron deficiency anemia
COMMENTS:

PROCEDURE:     FL  - FL UPPER GI WITH SMALL BOWEL  - May 13, 2011  [DATE]
RESULT:     Indication: Heme positive stool

[Series 1: run · 4 of 7 slices shown]
[im 1/7]
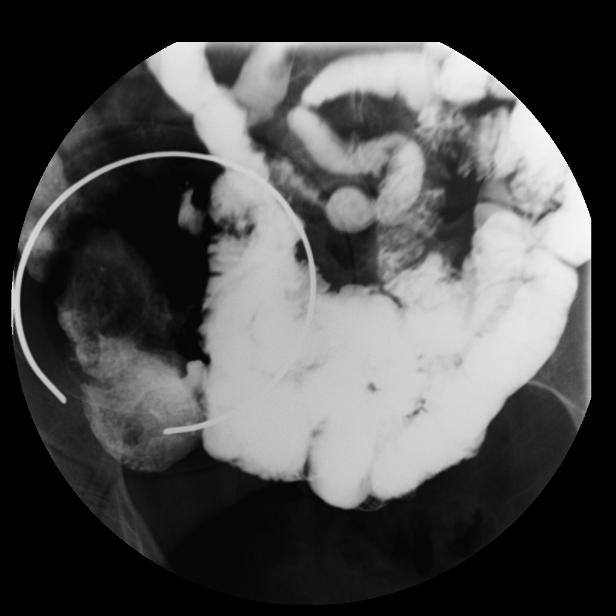
[im 3/7]
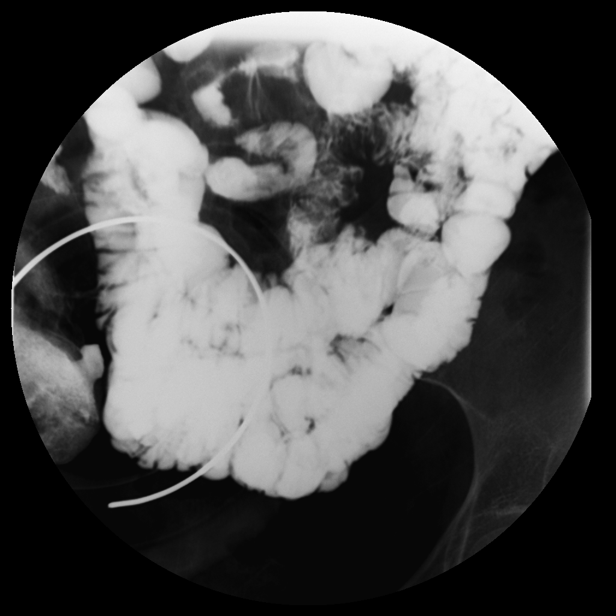
[im 5/7]
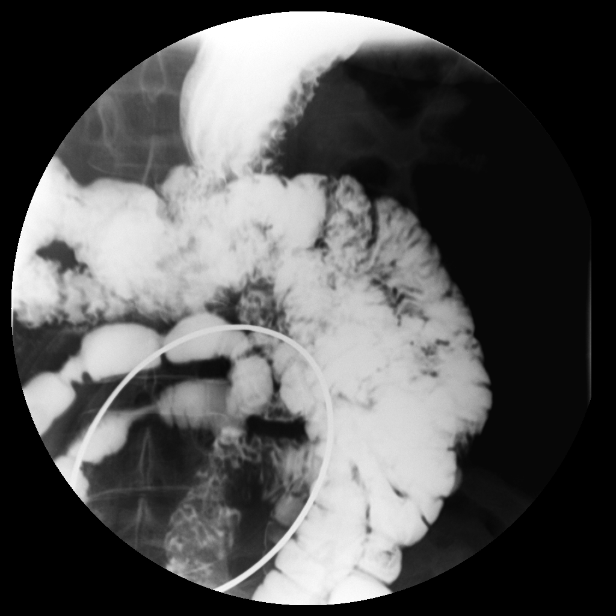
[im 6/7]
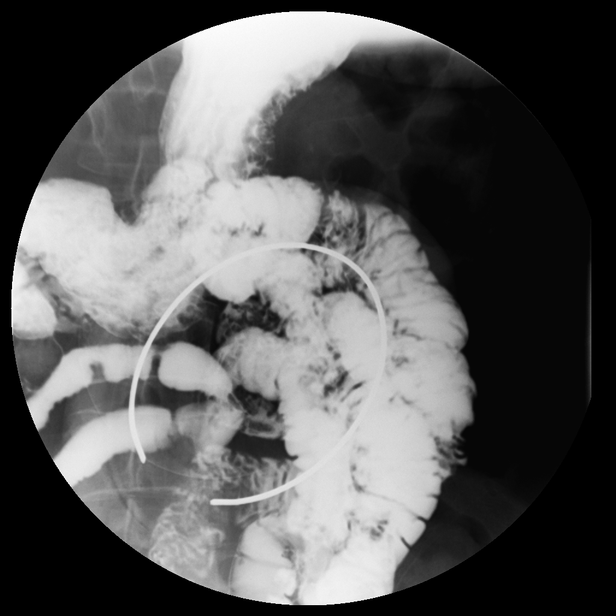

[Series 2: fluoro_barium 2fps_bw · 0.17mm/px · 2 of 18 frames shown (1 of 7)]
[frame 3/18]
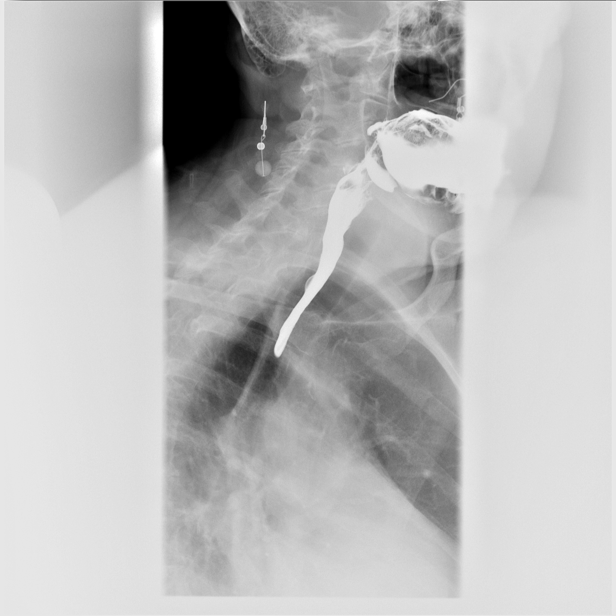
[frame 16/18]
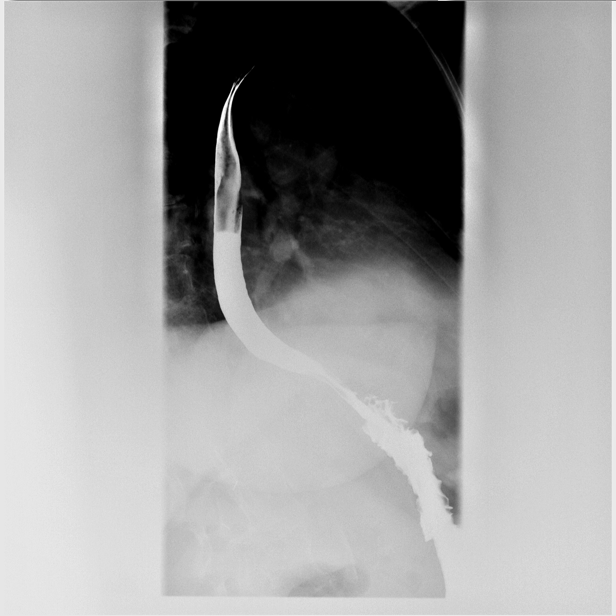

[Series 3: fluoro_barium 2fps_bw · 0.17mm/px · 2 of 24 frames shown (2 of 7)]
[frame 4/24]
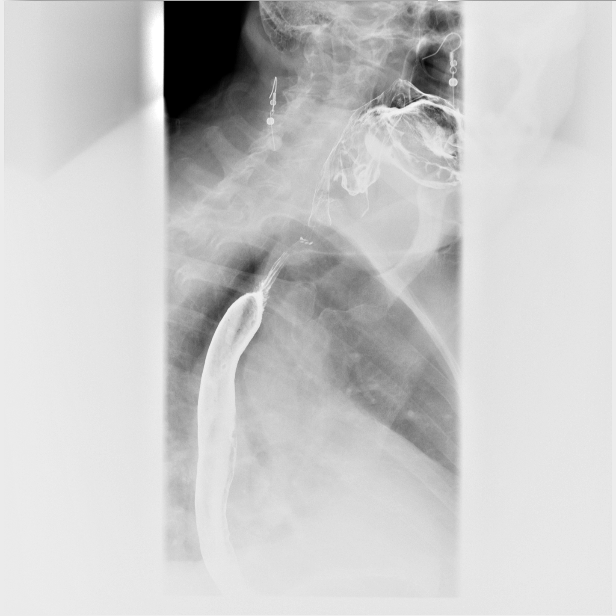
[frame 21/24]
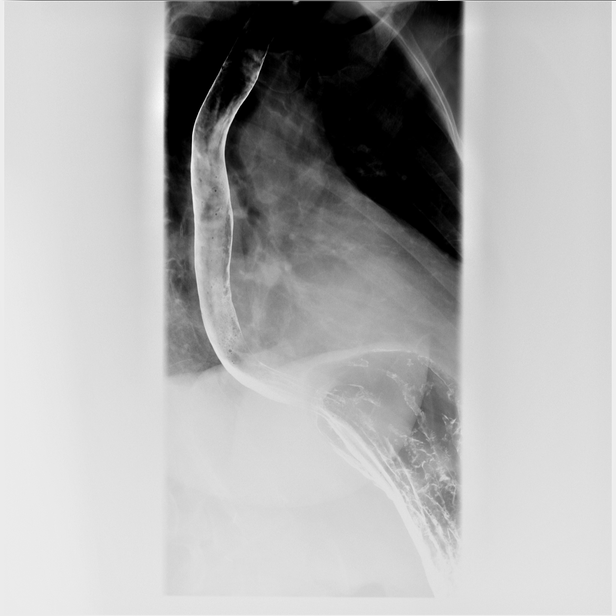

[Series 6: fluoro_barium 2fps_bw · 0.17mm/px · 1 of 2 frames shown (3 of 7)]
[frame 1/2]
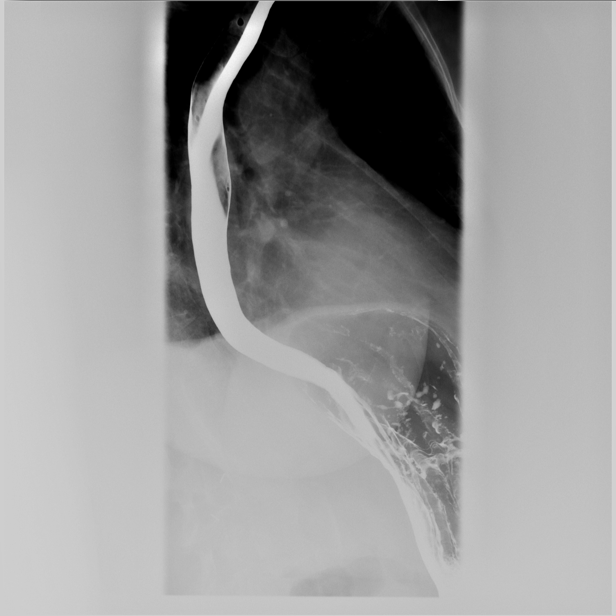

[Series 7: fluoro_barium 2fps_bw · 0.17mm/px · 1 of 2 frames shown (4 of 7)]
[frame 1/2]
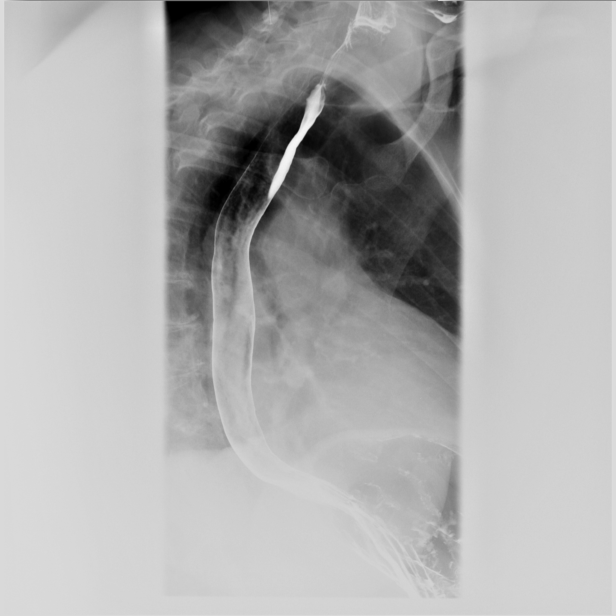

[Series 8: fluoro_barium 2fps_bw · 0.18mm/px · 1 of 1 slices shown (5 of 7)]
[im 1/1]
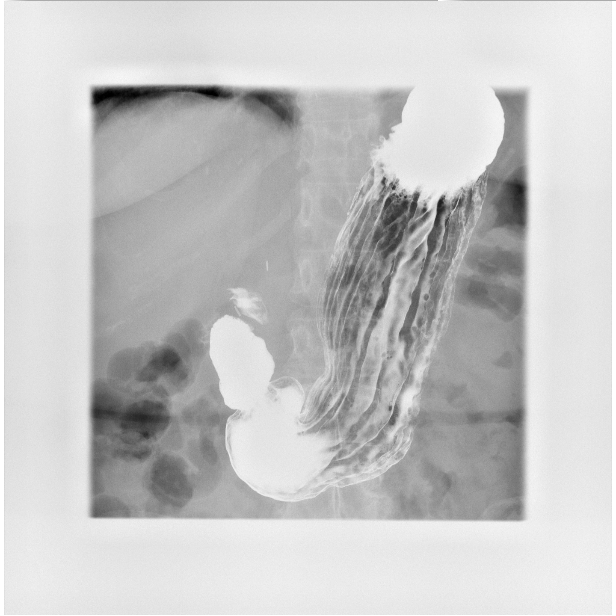

[Series 18: fluoro_barium 2fps_bw · 0.19mm/px · 2 of 28 frames shown (6 of 7)]
[frame 5/28]
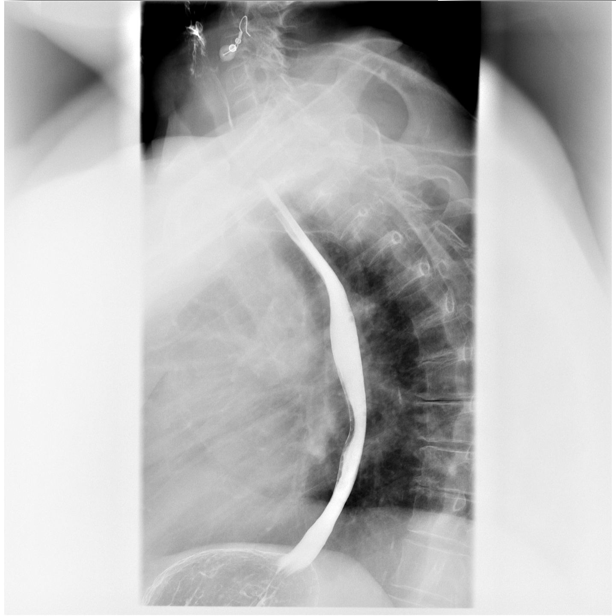
[frame 15/28]
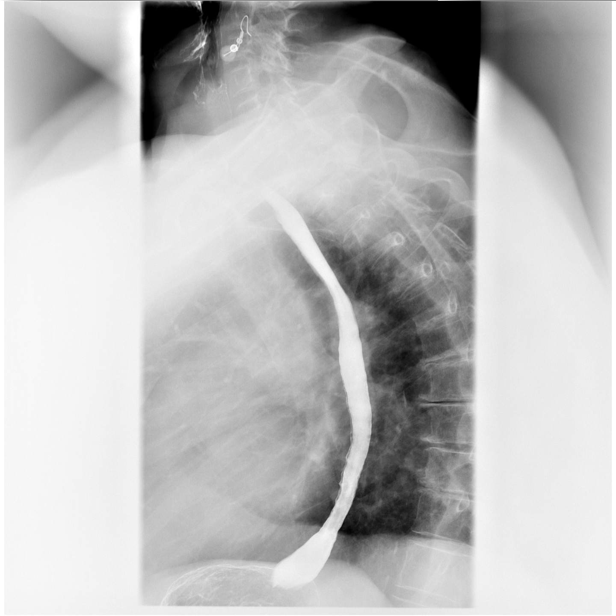

[Series 20: fluoro_barium 2fps_bw · 0.19mm/px · 1 of 1 slices shown (7 of 7)]
[im 1/1]
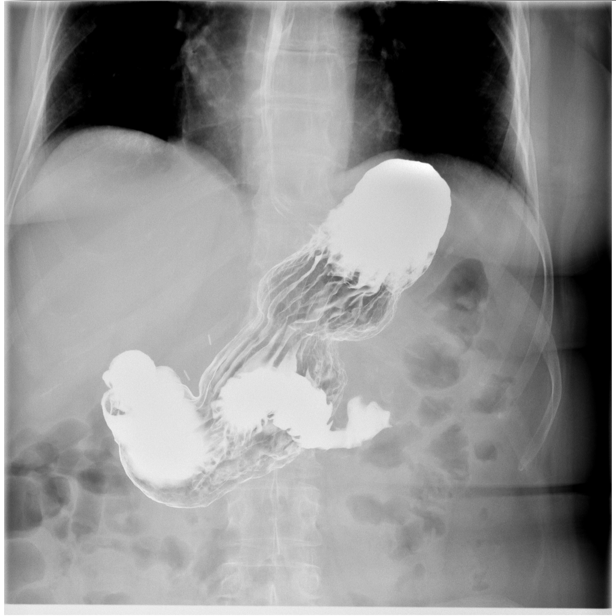

[14 of 23 positions shown; findings below may reference images not displayed]

FINDINGS: Biphasic examination of the esophagus to the distal duodenum was performed
without complication. Total fluoroscopy time was 1.4 minutes.

Examination of the esophagus demonstrated normal esophageal motility. Normal
esophageal morphology without evidence of esophagitis or ulceration. No
esophageal stricture, diverticula, or mass lesion. No evidence of hiatal
hernia. There is mild gastroesophageal reflux.

Examination of the stomach demonstrated normal rugal folds and areae
gastricae. The gastric mucosa appeared unremarkable without evidence of
ulceration, scarring, or mass lesion. Gastric motility and emptying was
normal. Fluoroscopic examination of the duodenum demonstrates normal
motility and morphology without evidence of ulceration or mass lesion.

Medium density barium was periodically observed under fluoroscopy to travel
from the stomach to the ascending colon (over a 15 minute time period).
There is no evidence of small bowel stricture or obstruction. No large
filling defects to suggest mass lesion. In addition, there is no evidence of
tethering or definite inflammatory changes present within the small bowel.
IMPRESSION: 1. Mild gastroesophageal reflux. Normal esophageal, gastric, and duodenal
motility.
2. No evidence of gastric or duodenal ulceration or mass lesion.
3. Normal small bowel study without evidence of tethering, mass lesion, or
obstruction within the small bowel.

## 2012-05-16 ENCOUNTER — Telehealth: Payer: Self-pay | Admitting: Pulmonary Disease

## 2012-05-16 MED ORDER — BUDESONIDE-FORMOTEROL FUMARATE 160-4.5 MCG/ACT IN AERO: 2.0000 | INHALATION_SPRAY | Freq: Two times a day (BID) | RESPIRATORY_TRACT | Status: DC

## 2012-05-16 NOTE — Telephone Encounter (Signed)
Rx has been sent in. I had to look back at OV notes to find out if she was changed to Symbicort because it wasn't on her medication list. I attempted to call the pt and let her know that this had been sent in for her but her phone line just rang and rang.

## 2012-07-07 IMAGING — MG MM ADDITIONAL VIEWS AT NO CHARGE
1 series · 3 of 3 positions shown · non-contrast
Comparison: none

REASON FOR EXAM: AV RT ASYMMETRIC DENSITY
COMMENTS:

PROCEDURE:     MAM - MAM DIG ADDVIEWS RT SCR  - July 09, 2011  [DATE]
RESULT:     The area of asymmetric density within the right breast effaces
with compression.

[R ML · right · 3 of 3 slices shown]
[im 1/3]
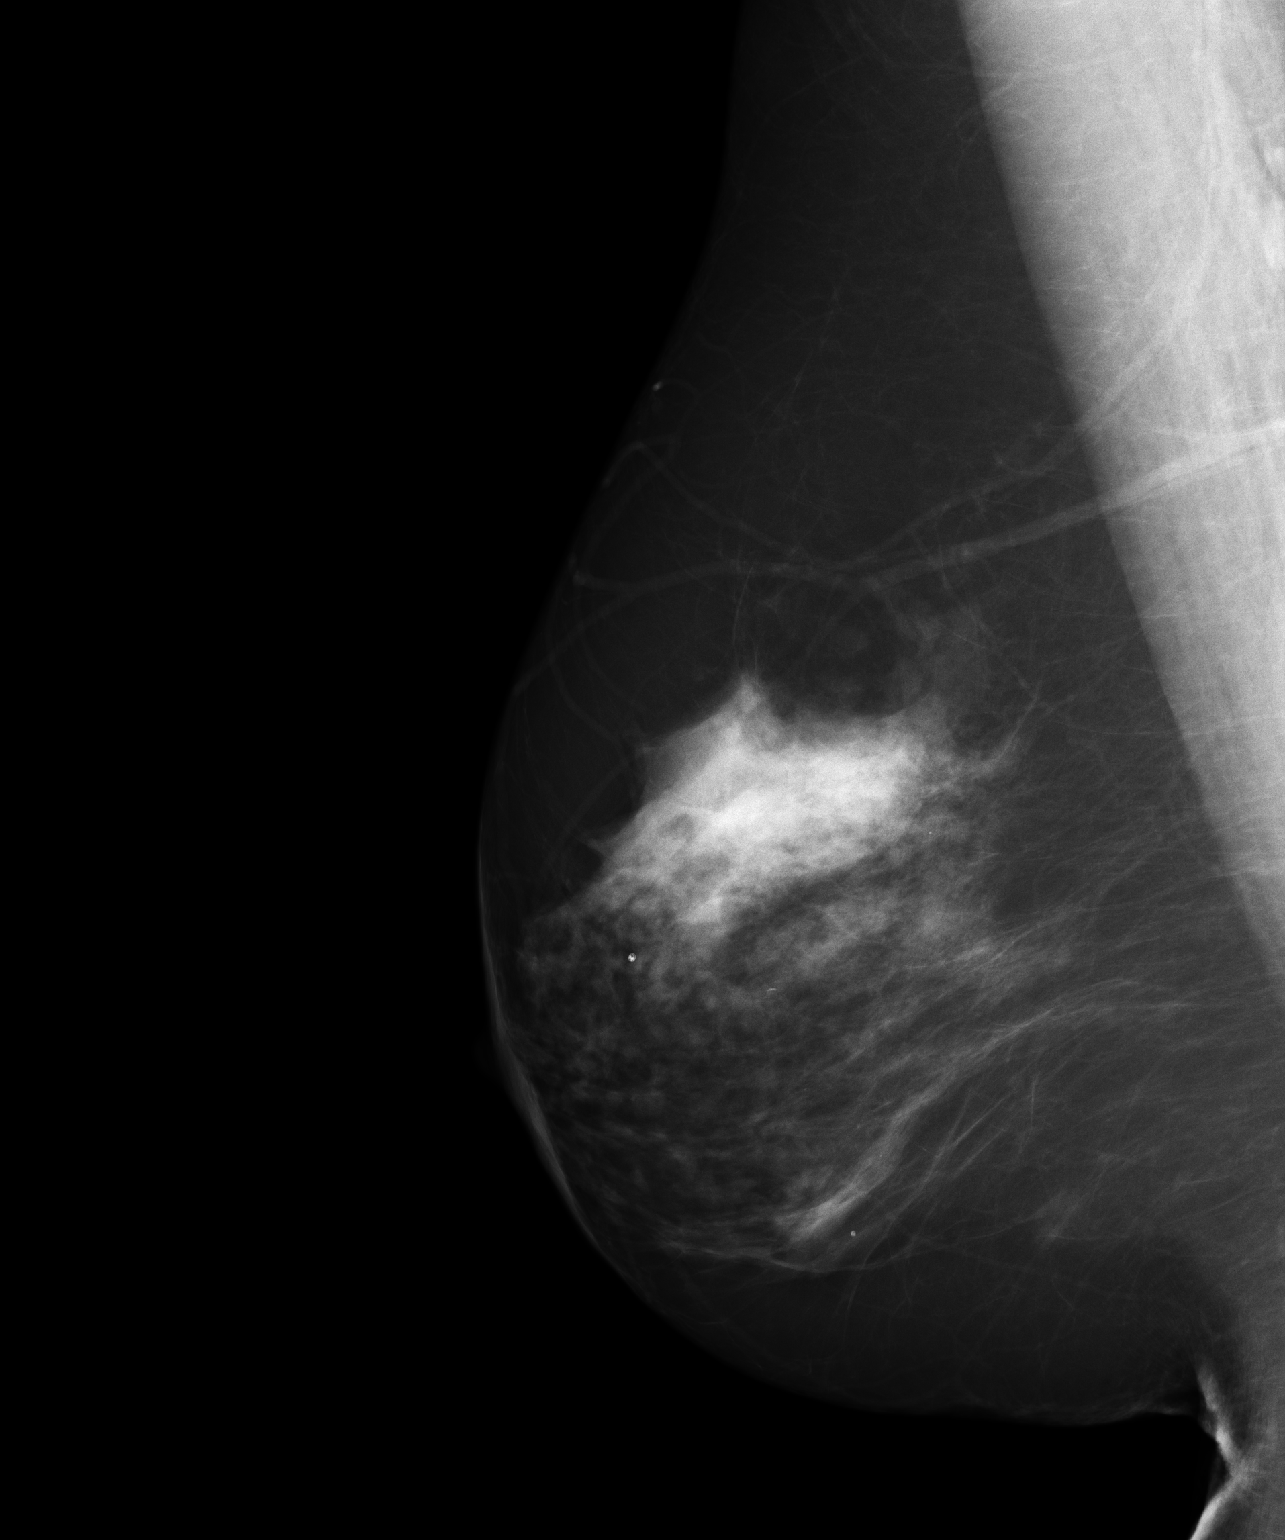
[im 2/3]
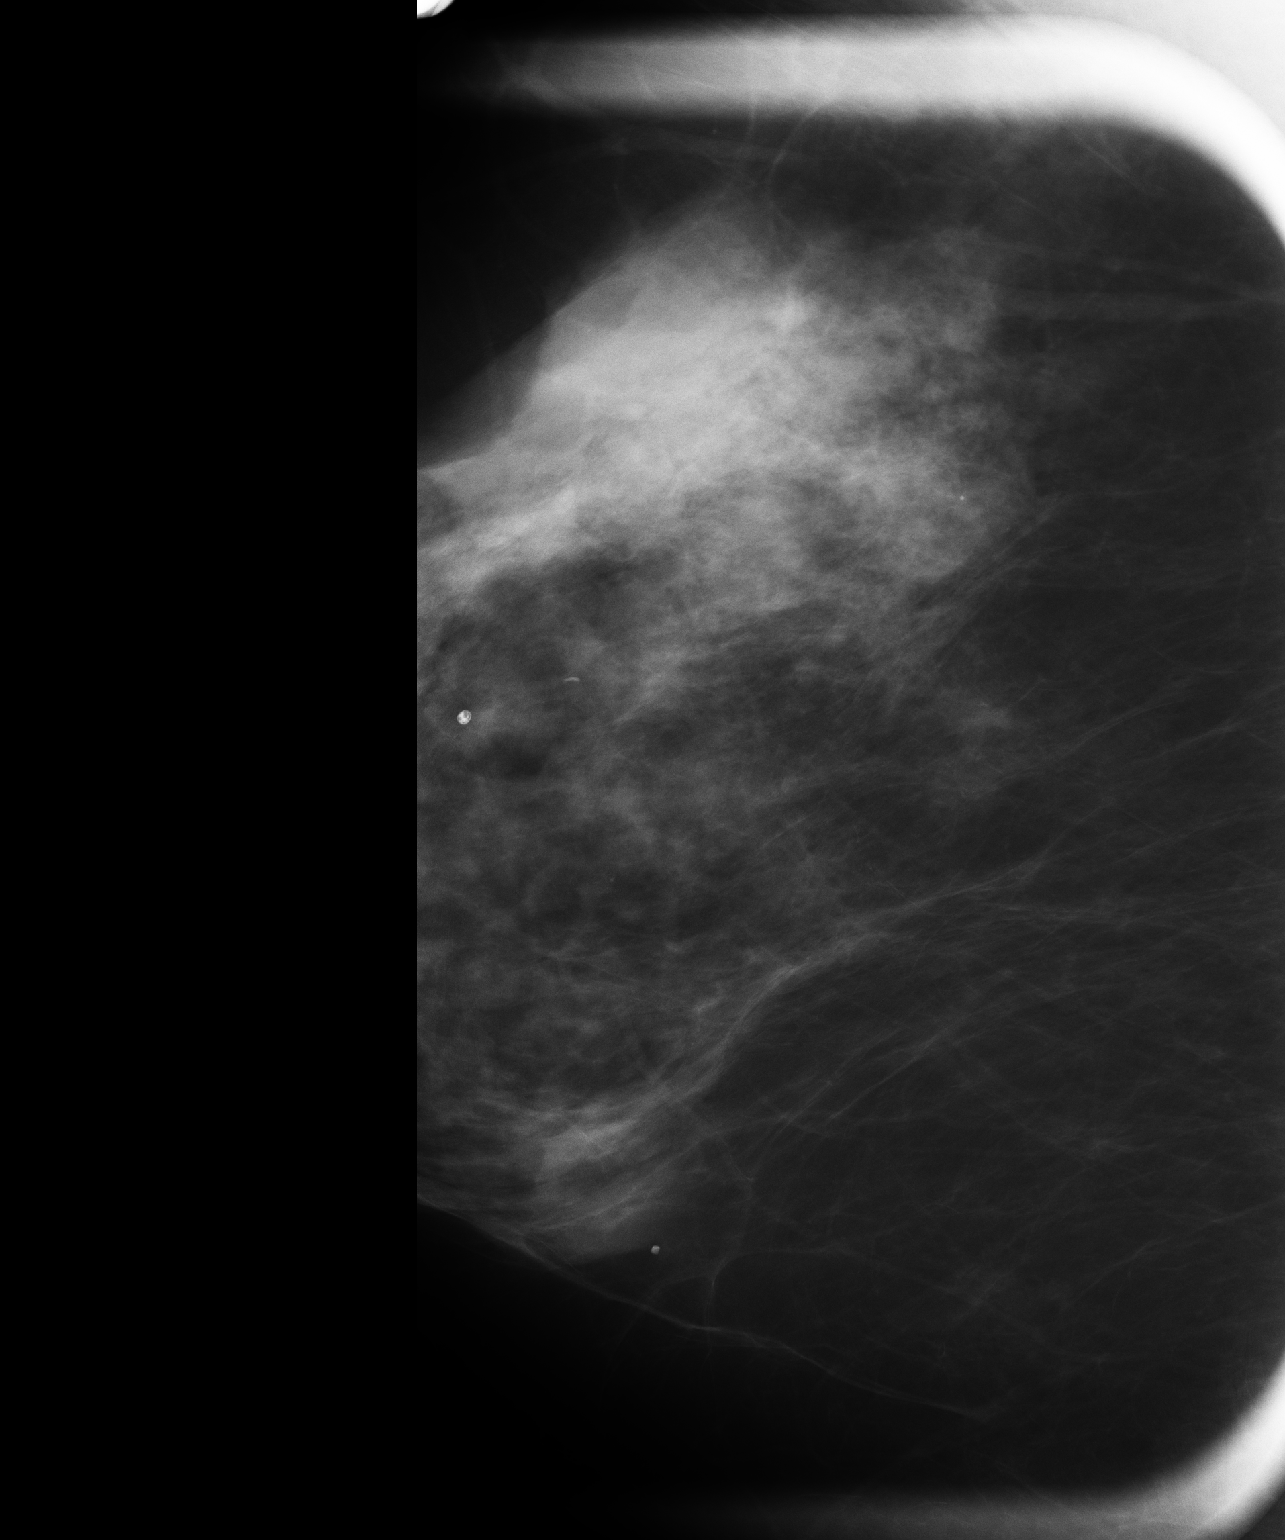
[im 3/3]
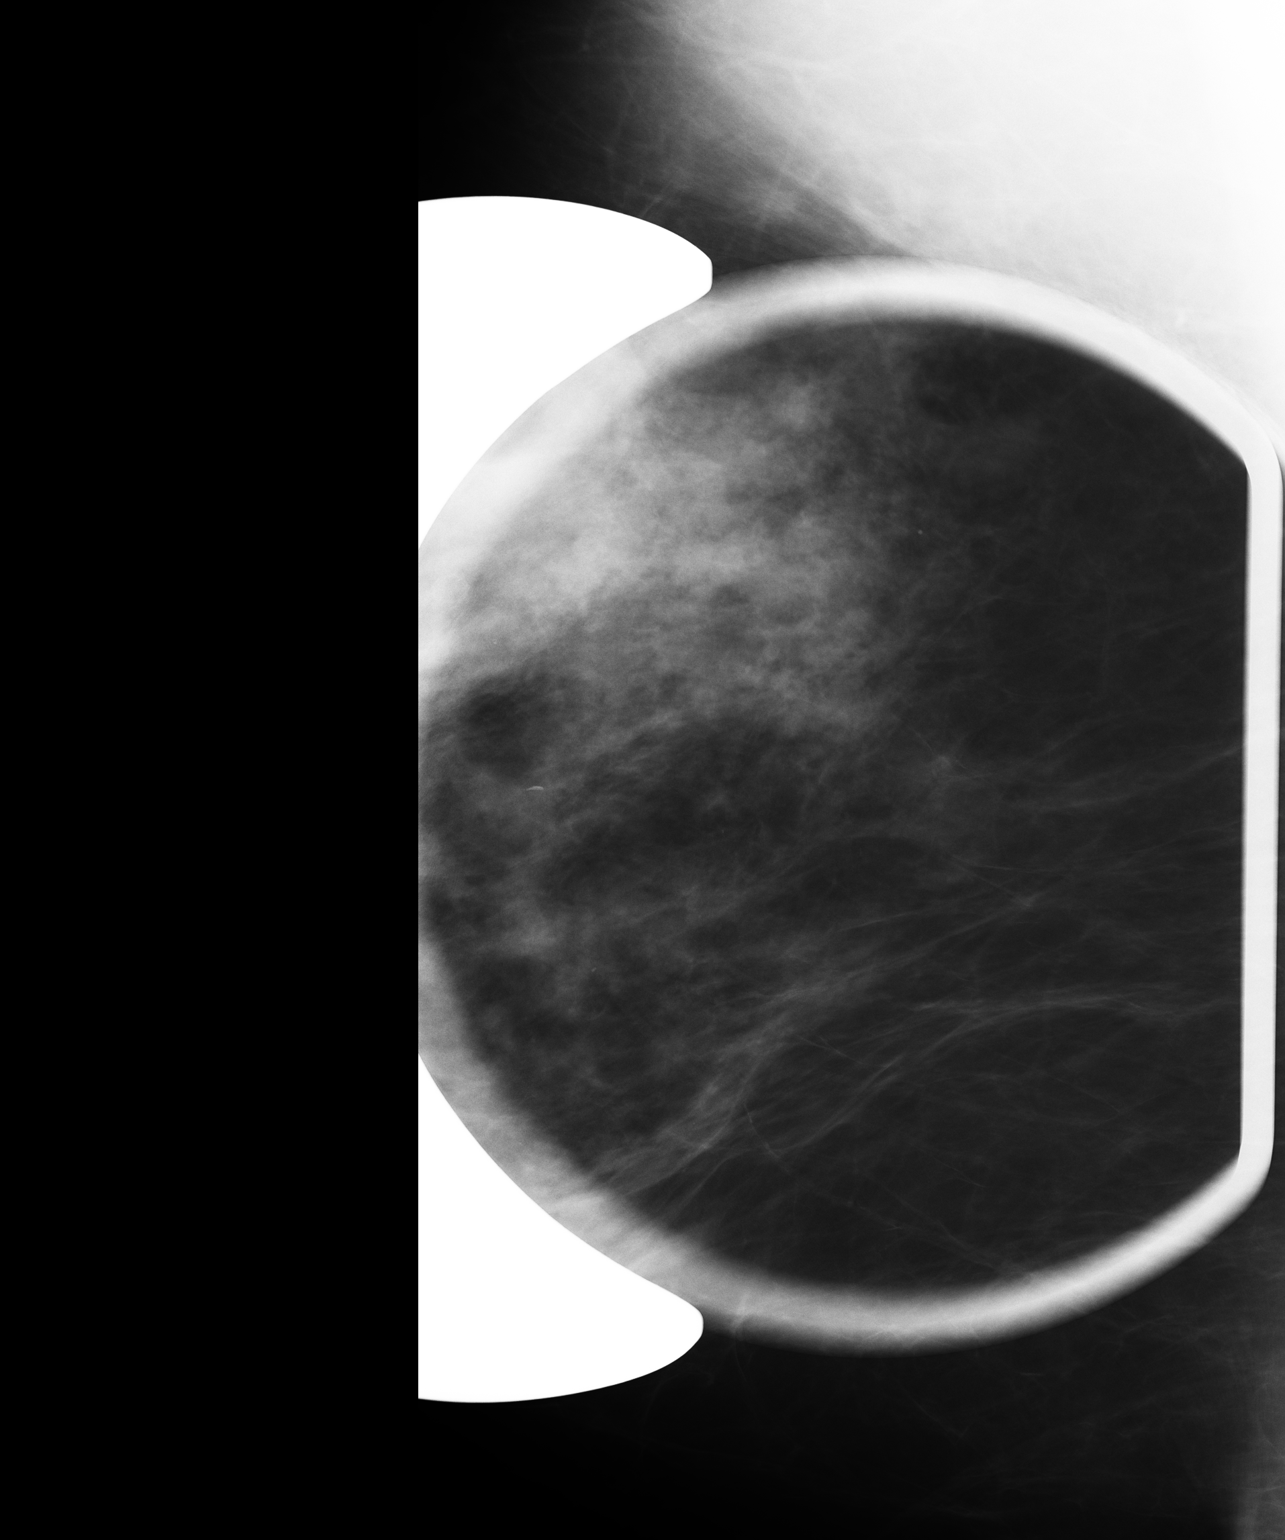

[3 of 3 positions shown; findings below may reference images not displayed]

IMPRESSION: BI-RADS: Category 2 - Benign Findings

A NEGATIVE MAMMOGRAM REPORT DOES NOT PRECLUDE BIOPSY OR OTHER EVALUATION OF
A CLINICALLY PALPABLE OR OTHERWISE SUSPICIOUS MASS OR LESION. BREAST CANCER
MAY NOT BE DETECTED BY MAMMOGRAPHY IN UP TO 10% OF CASES.

## 2012-07-25 ENCOUNTER — Emergency Department: Payer: Self-pay | Admitting: Emergency Medicine

## 2012-09-02 ENCOUNTER — Ambulatory Visit: Payer: Self-pay | Admitting: Internal Medicine

## 2012-09-06 ENCOUNTER — Encounter: Payer: Self-pay | Admitting: Pulmonary Disease

## 2012-09-06 ENCOUNTER — Ambulatory Visit (INDEPENDENT_AMBULATORY_CARE_PROVIDER_SITE_OTHER): Payer: Medicare Other | Admitting: Pulmonary Disease

## 2012-09-06 VITALS — BP 118/62 | HR 72 | Temp 98.1°F | Ht 64.0 in | Wt 127.0 lb

## 2012-09-06 DIAGNOSIS — I10 Essential (primary) hypertension: Secondary | ICD-10-CM

## 2012-09-06 DIAGNOSIS — J449 Chronic obstructive pulmonary disease, unspecified: Secondary | ICD-10-CM

## 2012-09-06 MED ORDER — NEBIVOLOL HCL 10 MG PO TABS: 10.0000 mg | ORAL_TABLET | Freq: Every day | ORAL | Status: DC

## 2012-09-06 MED ORDER — BUDESONIDE-FORMOTEROL FUMARATE 160-4.5 MCG/ACT IN AERO: 2.0000 | INHALATION_SPRAY | Freq: Two times a day (BID) | RESPIRATORY_TRACT | Status: DC

## 2012-09-06 NOTE — Progress Notes (Signed)
Subjective:    Patient ID: Kerri Carter, female    DOB: 01/01/1941, 72 y.o.   MRN: [id number]  Synopsis: Kerri Carter established care for COPD at the [facility] office in 08/2011.  Had PFTs in 04/2011 in March 2013 which showed: Ratio 70%, clear obstruction on flow volume loop, FEV1 1.79 L (81% predicted.), Total lung capacity 5.3 L (115% predicted), residual volume 2.49 L (136% predicted), DLCO 84% predicted. She smoked 0.5packs per day for 20 years.    HPI   01/19/12 ROV -- Since our last visit Kerri Carter had a L hip fracture and required ORIF.  She is still participating in rehab with PT.  Her breathing is doing great and she is only using the [facility] every so often.  She has not had cough or worsening shortness of breath.  She had a flu shot in October.  She is still using a walker in the house.     She has had some black tarry stool for the last week. She stopped taking the Xarelto because she was worried this was related.  This has been low volume and not associated with pain or vomiting.    3/13 Acute- Wert, cough; Rx PPI, change coreg to bystolic, change spiriva to Symbicort  3/31 ROV - Kerri Carter has done quite well since the last visit. Her cough is improved significantly and she is no longer short of breath. She states that she noticed a near immediate change in her breathing the night after she started Symbicort. She says that her blood pressure and heart rate have been well controlled on the Nebivolol. She continues to take her Protonix twice a day and has had minimal heartburn. She has no sinus congestion.  09/06/2012 ROV >> Kerri Carter has been doing quite well since the last visit. She has no shortness of breath and minimal cough. Her blood pressure and heart rate has been well-controlled on the Nebivolol.   Past Medical History  Diagnosis Date  . Hypothyroidism   . HTN (hypertension)   . CRI (chronic renal insufficiency)   . Hyperlipidemia   . Diabetes mellitus   . Asthma   . Angioedema    . Anaphylaxis   . Osteoarthritis     hands, low titer rheumatoid factor, carpal tunnel  . Carpal tunnel syndrome      Review of Systems  Constitutional: Negative for chills, activity change and fatigue.  Respiratory: Negative for cough, shortness of breath and stridor.   Cardiovascular: Negative for chest pain, palpitations and leg swelling.       Objective:   Physical Exam   Filed Vitals:   09/06/12 1131  BP: 118/62  Pulse: 72  Temp: 98.1 F (36.7 C)  TempSrc: Oral  Height: 5\' 4"  (1.626 m)  Weight: 127 lb (57.607 kg)  SpO2: 98%   Gen: well appearing, no acute distress HEENT: NCAT, EOMi, OP clear,  PULM: CTA B CV: RRR, no mgr, no JVD AB: BS+, soft, nontender, no hsm Ext: warm, no edema, no clubbing, no cyanosis      Assessment & Plan:   COPD (chronic obstructive pulmonary disease) This is been a stable interval for Kerri Carter.  Plan: -Continue Symbicort. -Flu shot as soon as available.  HBP (high blood pressure) Continue Nebivolol    Updated Medication List Outpatient Encounter Prescriptions as of 09/06/2012  Medication Sig Dispense Refill  . albuterol (VENTOLIN HFA) 108 (90 BASE) MCG/ACT inhaler Inhale 2 puffs into the lungs every 6 (six) hours as needed.  1 Inhaler  1  . aspirin 81 MG tablet Take 81 mg by mouth daily.      . budesonide-formoterol (SYMBICORT) 160-4.5 MCG/ACT inhaler Inhale 2 puffs into the lungs 2 (two) times daily.  1 Inhaler  2  . chlorthalidone (HYGROTON) 25 MG tablet Take 12.5 mg by mouth daily.      Marland Kitchen gabapentin (NEURONTIN) 100 MG capsule Take 1 capsule by mouth 3 (three) times daily.      Marland Kitchen HYDROcodone-acetaminophen (VICODIN) 5-500 MG per tablet Take 1 tablet by mouth every 4 (four) hours as needed (cough).  30 tablet  0  . hydroxychloroquine (PLAQUENIL) 200 MG tablet Take 1 tablet by mouth 2 (two) times daily.       Marland Kitchen KLOR-CON 10 10 MEQ tablet Take 1 tablet by mouth daily.      Marland Kitchen lovastatin (ALTOPREV) 20 MG 24 hr tablet Take 20  mg by mouth at bedtime.      . montelukast (SINGULAIR) 10 MG tablet Take 10 mg by mouth at bedtime.      . nebivolol (BYSTOLIC) 10 MG tablet Take 1 tablet (10 mg total) by mouth daily.  30 tablet  2  . pantoprazole (PROTONIX) 40 MG tablet Take 40 mg by mouth 2 (two) times daily.       . potassium chloride (K-DUR,KLOR-CON) 10 MEQ tablet Take 10 mEq by mouth daily.       . QUEtiapine (SEROQUEL) 50 MG tablet Take 50 mg by mouth at bedtime.      . saccharomyces boulardii (FLORASTOR) 250 MG capsule Take 250 mg by mouth as directed.      . zolpidem (AMBIEN CR) 12.5 MG CR tablet Take 12.5 mg by mouth at bedtime as needed.       No facility-administered encounter medications on file as of 09/06/2012.

## 2012-09-06 NOTE — Assessment & Plan Note (Signed)
Continue Nebivolol

## 2012-09-06 NOTE — Patient Instructions (Signed)
Take the flu shot as soon as it is available Keep taking the symbicort as you are doing Keep taking the bystolic as written or as directed by your cardiologist We will see you back in 6 months or sooner if needed

## 2012-09-06 NOTE — Assessment & Plan Note (Addendum)
This is been a stable interval for Kerri Carter.  Plan: -Continue Symbicort. -Flu shot as soon as available.

## 2012-11-14 IMAGING — MR MRI OF THE LEFT HIP WITHOUT CONTRAST
6 series · 40 of 40 positions shown · non-contrast
Comparison: none

REASON FOR EXAM: lt hip pain no response to steroids and pain meds
COMMENTS:

[Series 2: T1 · axial · 8.0mm · 0.88mm/px · z∈[+0,+167]mm · 2 of 16 slices shown (1 of 3)]
[im 1/16]
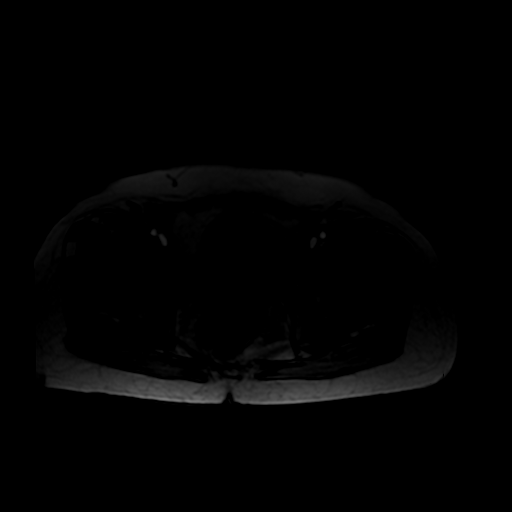
[im 16/16]
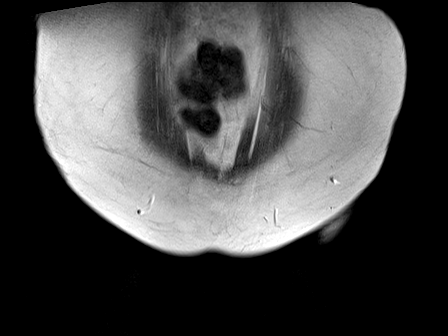

[Series 2: T1 · coronal · 4.0mm · 0.85mm/px · 4 of 20 slices shown (2 of 3)]
[im 1/20]
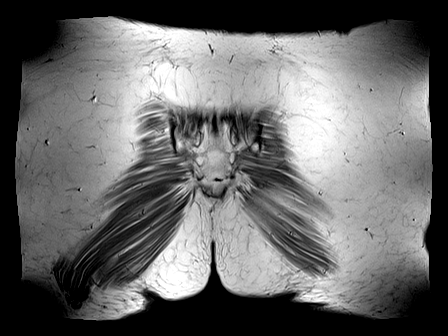
[im 7/20]
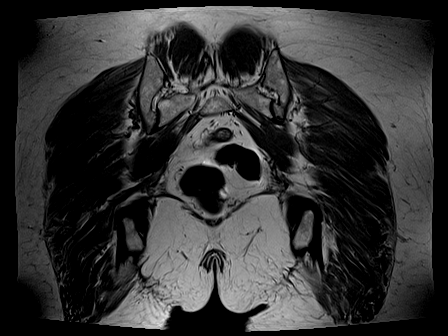
[im 13/20]
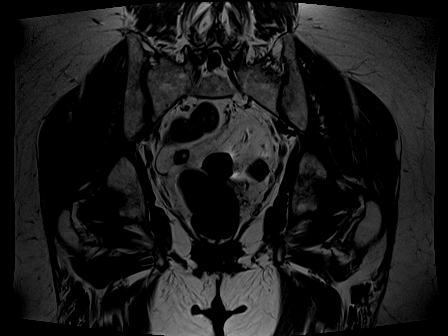
[im 20/20]
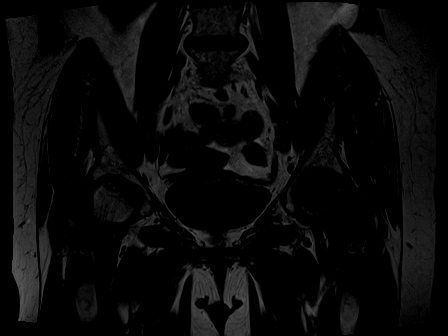

[Series 3: T2 fat-sat · axial · 8.0mm · 0.88mm/px · z∈[+0,+167]mm · 8 of 36 slices shown (1 of 3)]
[im 1/36]
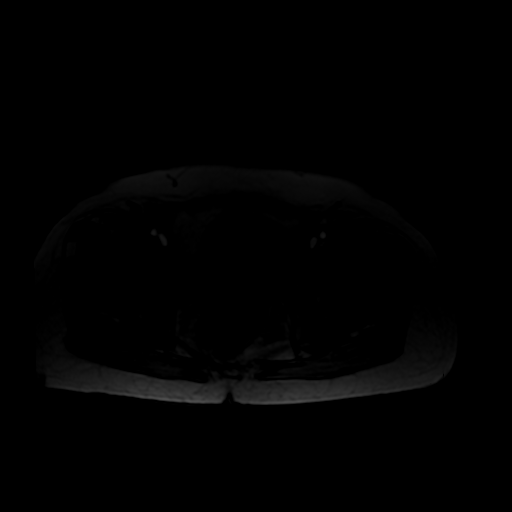
[im 6/36]
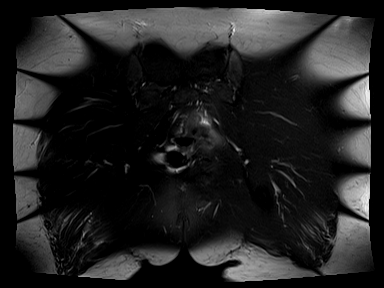
[im 11/36]
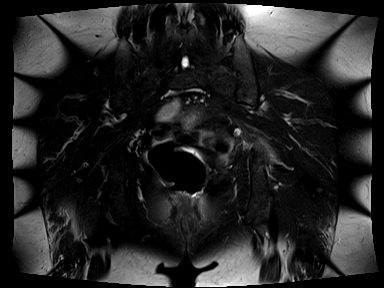
[im 16/36]
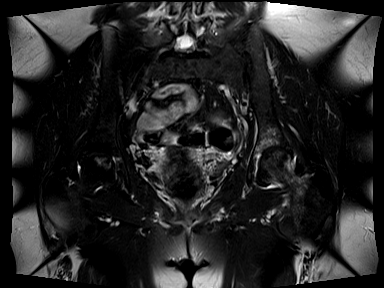
[im 21/36]
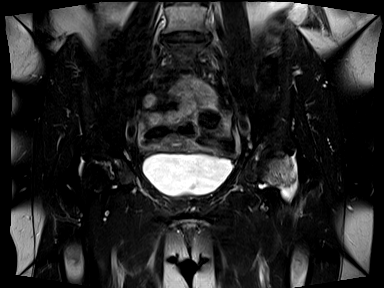
[im 26/36]
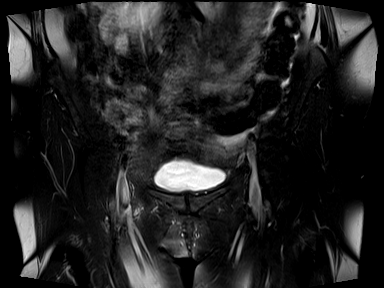
[im 31/36]
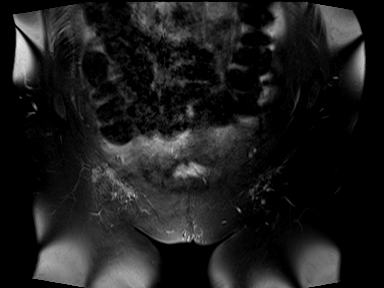
[im 36/36]
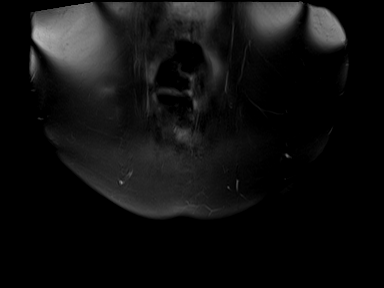

[Series 4: T2 fat-sat · axial · 3.0mm · 1.37mm/px · z∈[+33,+185]mm · 8 of 36 slices shown (2 of 3)]
[im 1/36]
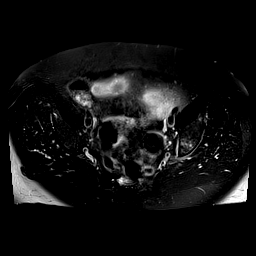
[im 6/36]
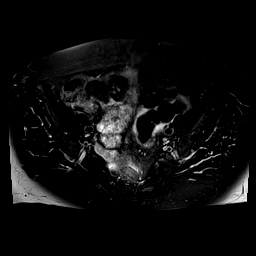
[im 11/36]
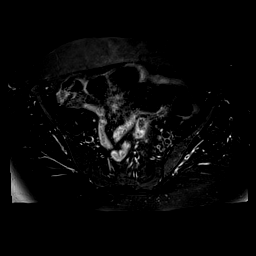
[im 16/36]
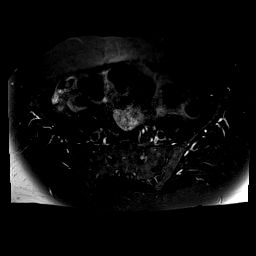
[im 21/36]
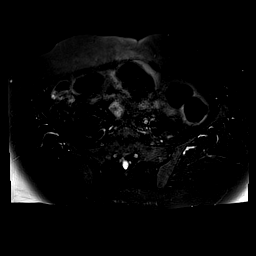
[im 26/36]
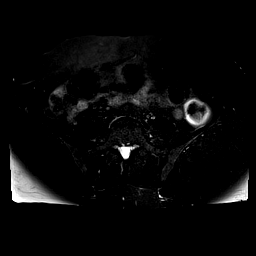
[im 31/36]
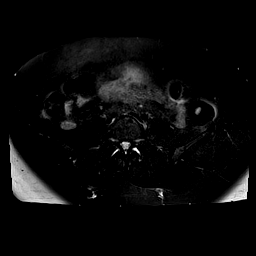
[im 36/36]
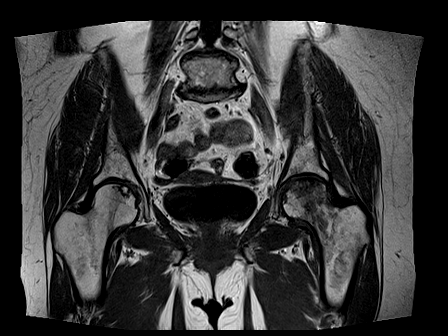

[Series 5: T2 fat-sat · axial · 3.0mm · 1.37mm/px · z∈[-89,+177]mm · 8 of 36 slices shown (3 of 3)]
[im 1/36]
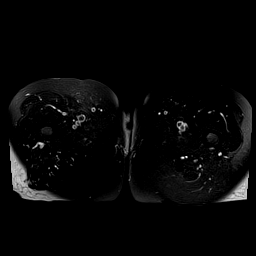
[im 6/36]
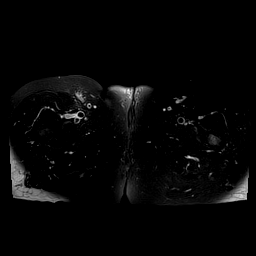
[im 11/36]
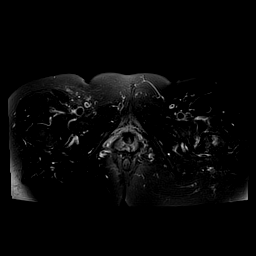
[im 16/36]
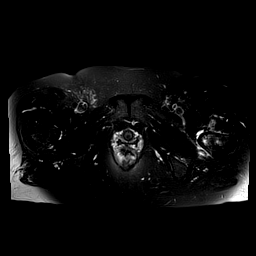
[im 21/36]
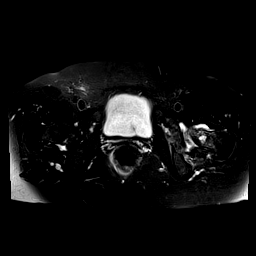
[im 26/36]
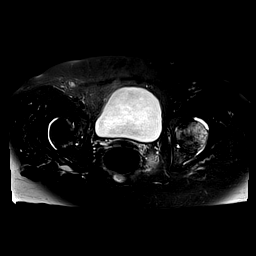
[im 31/36]
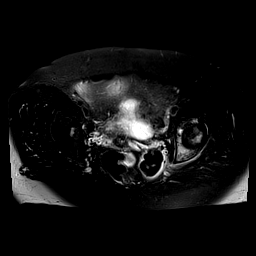
[im 36/36]
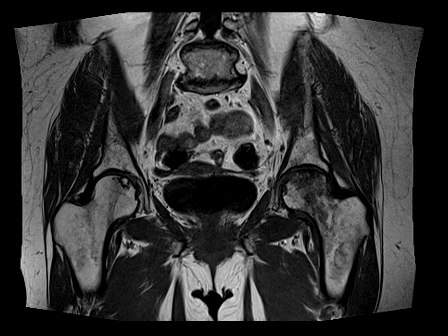

[Series 6: T1 · axial · 4.5mm · 0.85mm/px · z∈[-87,+199]mm · 10 of 46 slices shown (3 of 3)]
[im 1/46]
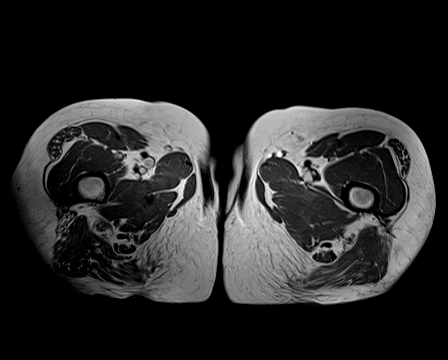
[im 6/46]
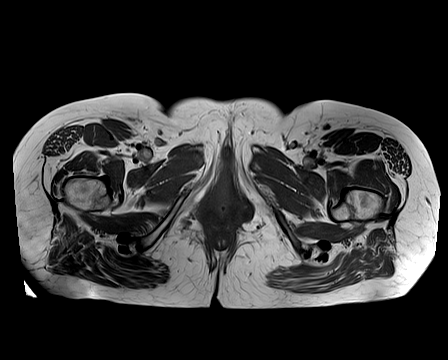
[im 11/46]
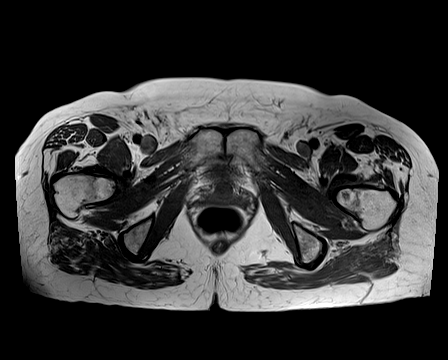
[im 16/46]
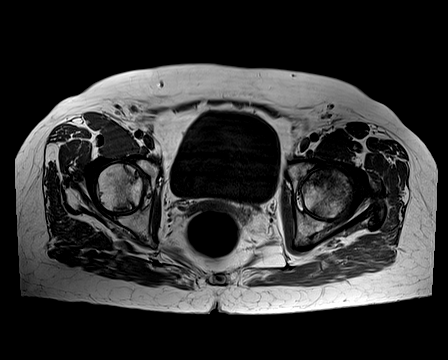
[im 21/46]
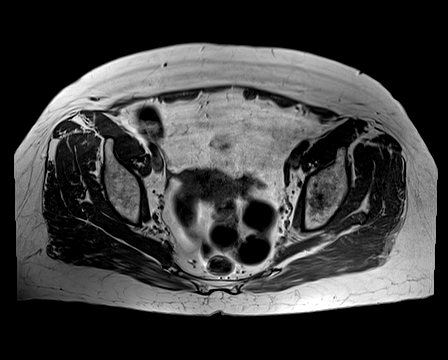
[im 26/46]
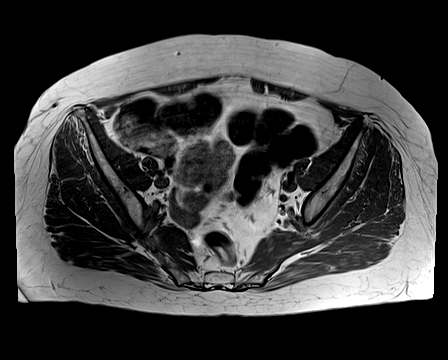
[im 31/46]
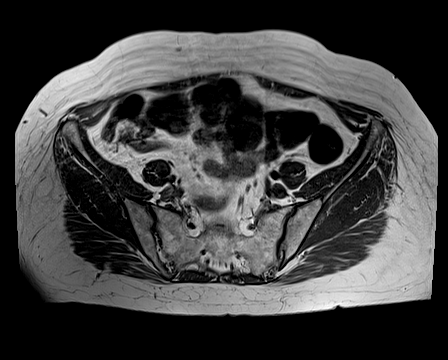
[im 36/46]
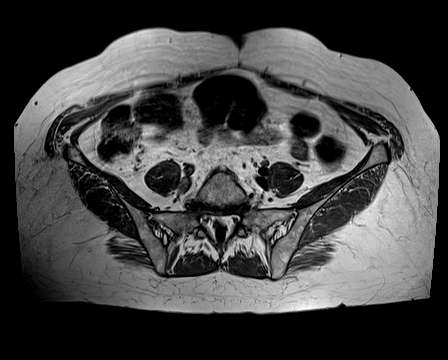
[im 41/46]
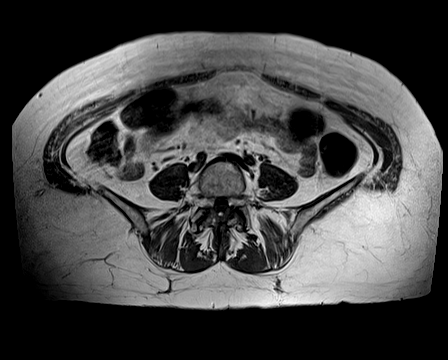
[im 46/46]
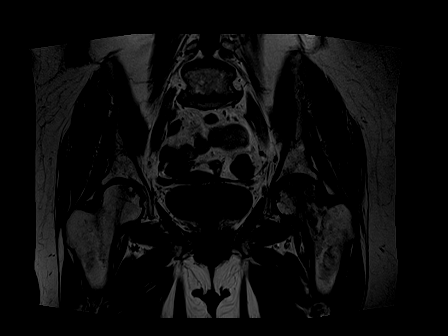

[40 of 40 positions shown; findings below may reference images not displayed]

PROCEDURE:     MMR - MMR HIP LT WO CONTRAST  - November 16, 2011 [DATE]

RESULT:     Multiplanar images were obtained through the hips without
administration of gadolinium.

The left femoral head exhibits a heterogeneous pattern of increased marrow
signal consistent with edema. Subarticular cystic changes are noted. There
is no collapse of the articular surface. There is increased signal in the
medial and superior aspects of the adjacent left acetabulum. There is mild
narrowing of the joint space diffusely.

On the right there are similar but milder changes consistent with
osteoarthritis and avascular necrosis. No significant surrounding marrow
edema is seen. No collapse of the articular surface of the femoral head is
demonstrated.

The sacrum, iliac bones, and pubic bones exhibit normal marrow signal.
IMPRESSION: 1. The findings are consistent with avascular necrosis with considerable
marrow edema within the left femoral head and neck and adjacent acetabulum.
2. There are similar but milder changes of avascular necrosis in the right
femoral head and adjacent acetabulum.
3. Both hips demonstrate mild joint space narrowing consistent with
osteoarthritis.

[REDACTED]

## 2012-12-08 IMAGING — CR DG C-ARM 1-60 MIN
1 series · 1 of 1 positions shown · non-contrast
Comparison: none

REASON FOR EXAM: TOTAL HIP
COMMENTS:

PROCEDURE:     DXR - DXR C-ARM WITH 2 VIEWS LT HIP  - December 10, 2011 [DATE]
RESULT:     A single spot film from the operating room reveals the patient
to have undergone total left hip joint prosthesis placement. Radiographic
positioning of the prosthesis appears good where visualized.

[cont.]
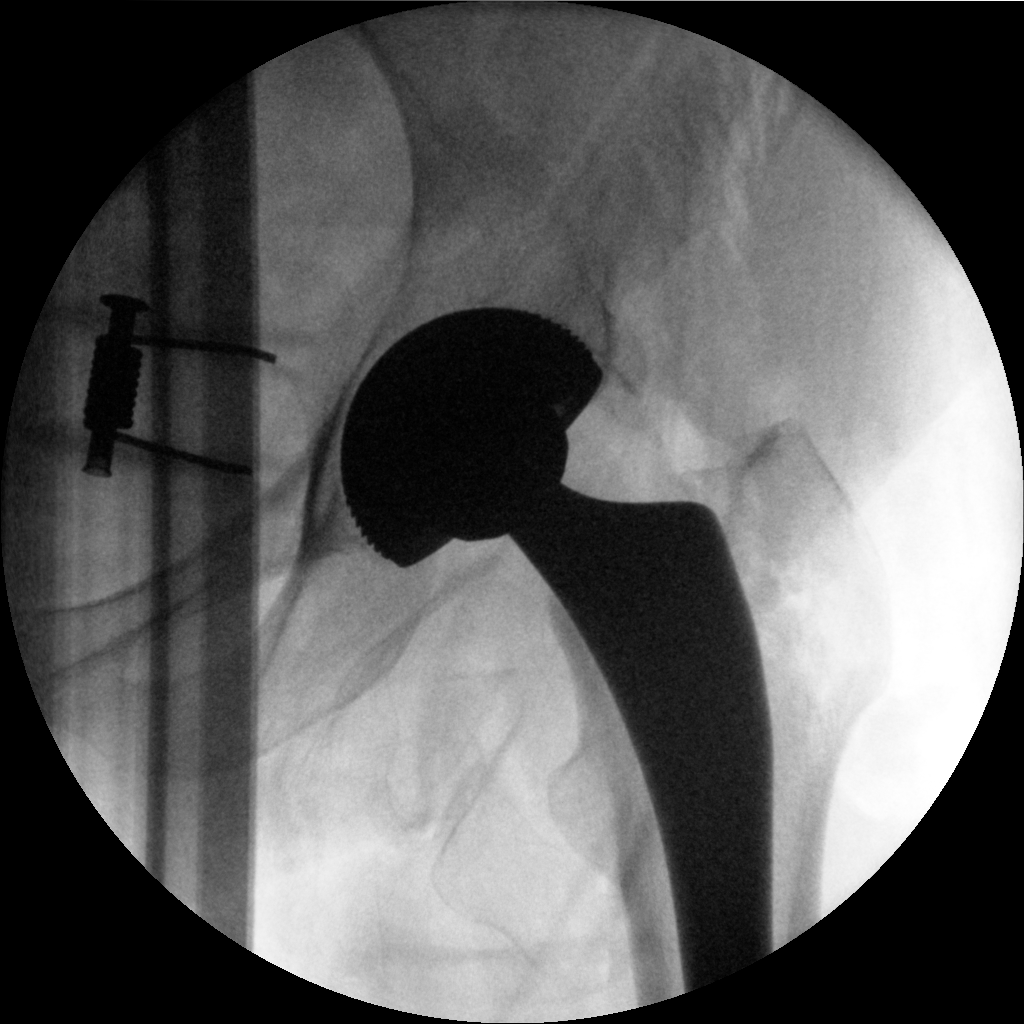

[1 of 1 positions shown; findings below may reference images not displayed]

IMPRESSION: The patient has undergone left hip prosthesis placement.
Further interpretation is deferred to Dr. Vondra.

[REDACTED]

## 2012-12-08 IMAGING — CR DG HIP COMPLETE 2+V*L*
1 series · 2 of 2 positions shown · non-contrast
Comparison: none

REASON FOR EXAM: s/p THA
COMMENTS:   Bedside (portable):Y

PROCEDURE:     DXR - DXR HIP LEFT COMPLETE  - December 10, 2011  [DATE]
RESULT:     AP and lateral views of the left hip reveal the patient has
undergone total joint prosthesis placement. Radiographic positioning of the
prosthesis is good. Surgical drain lines and skin staples are present.

[Series 1: ap · 0.17mm/px · 2 of 2 slices shown]
[im 1/2]
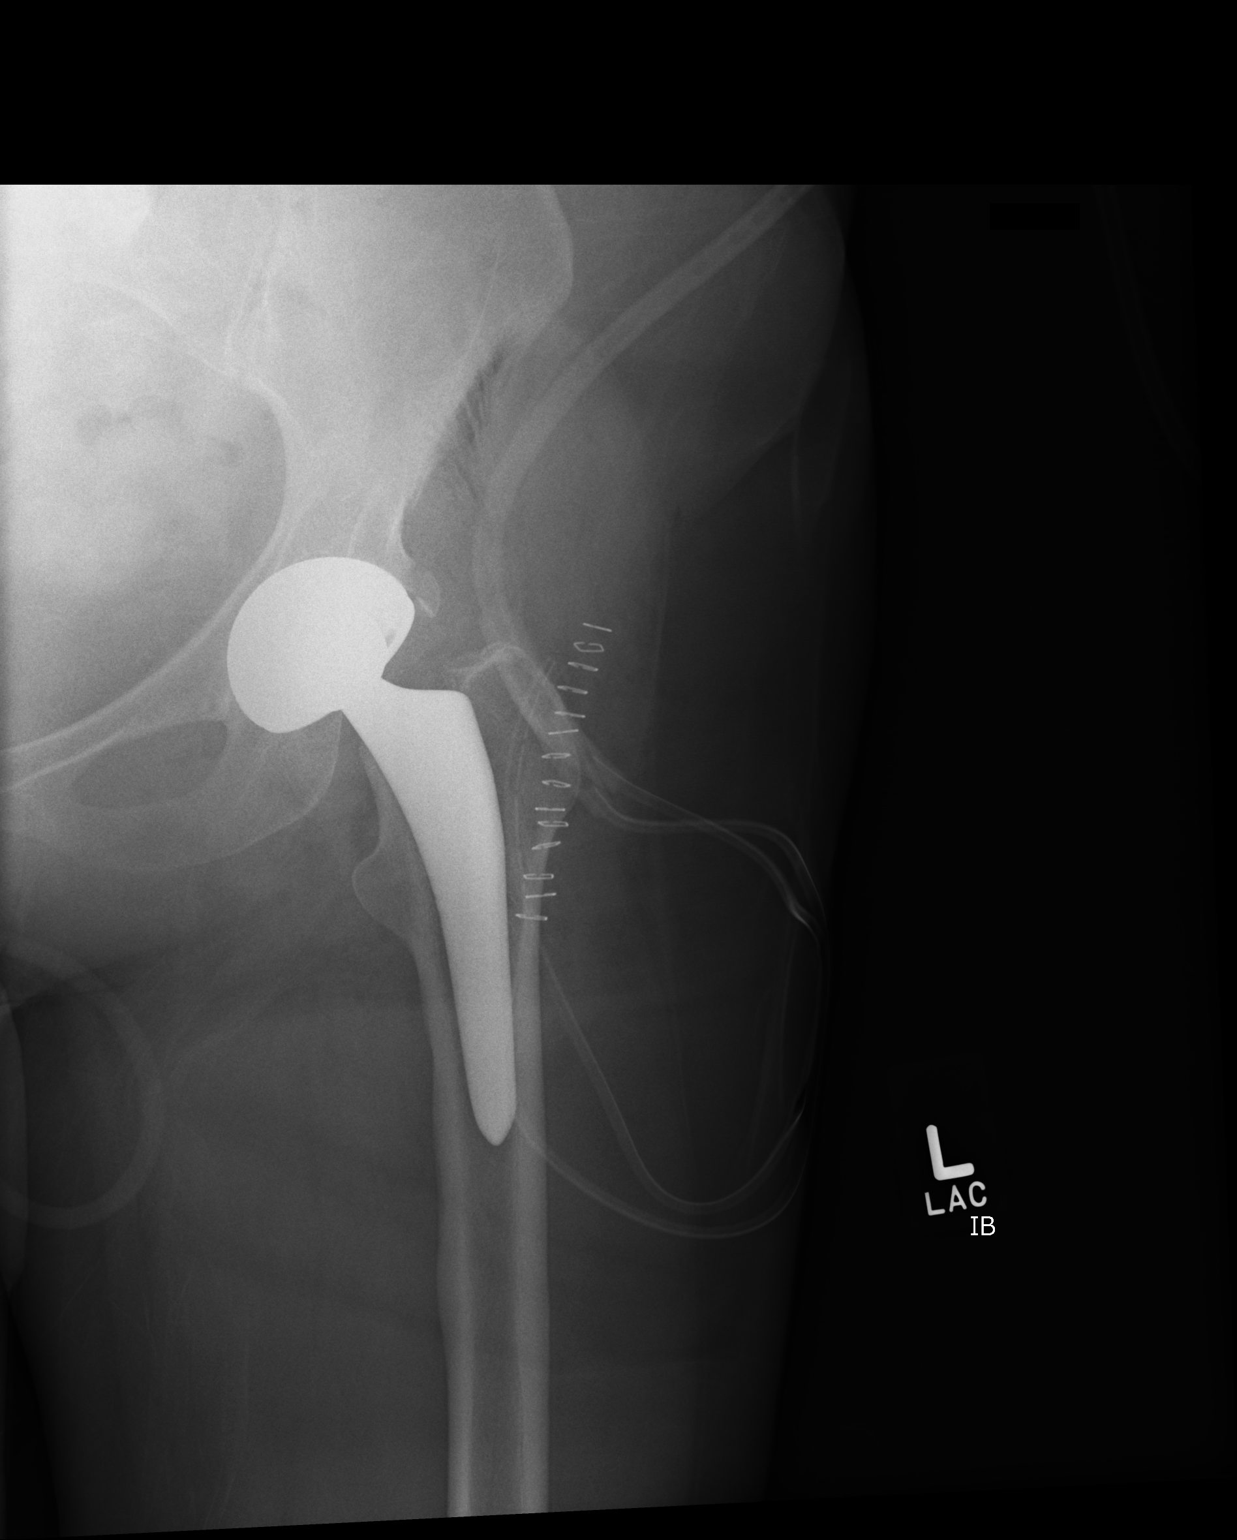
[im 2/2]
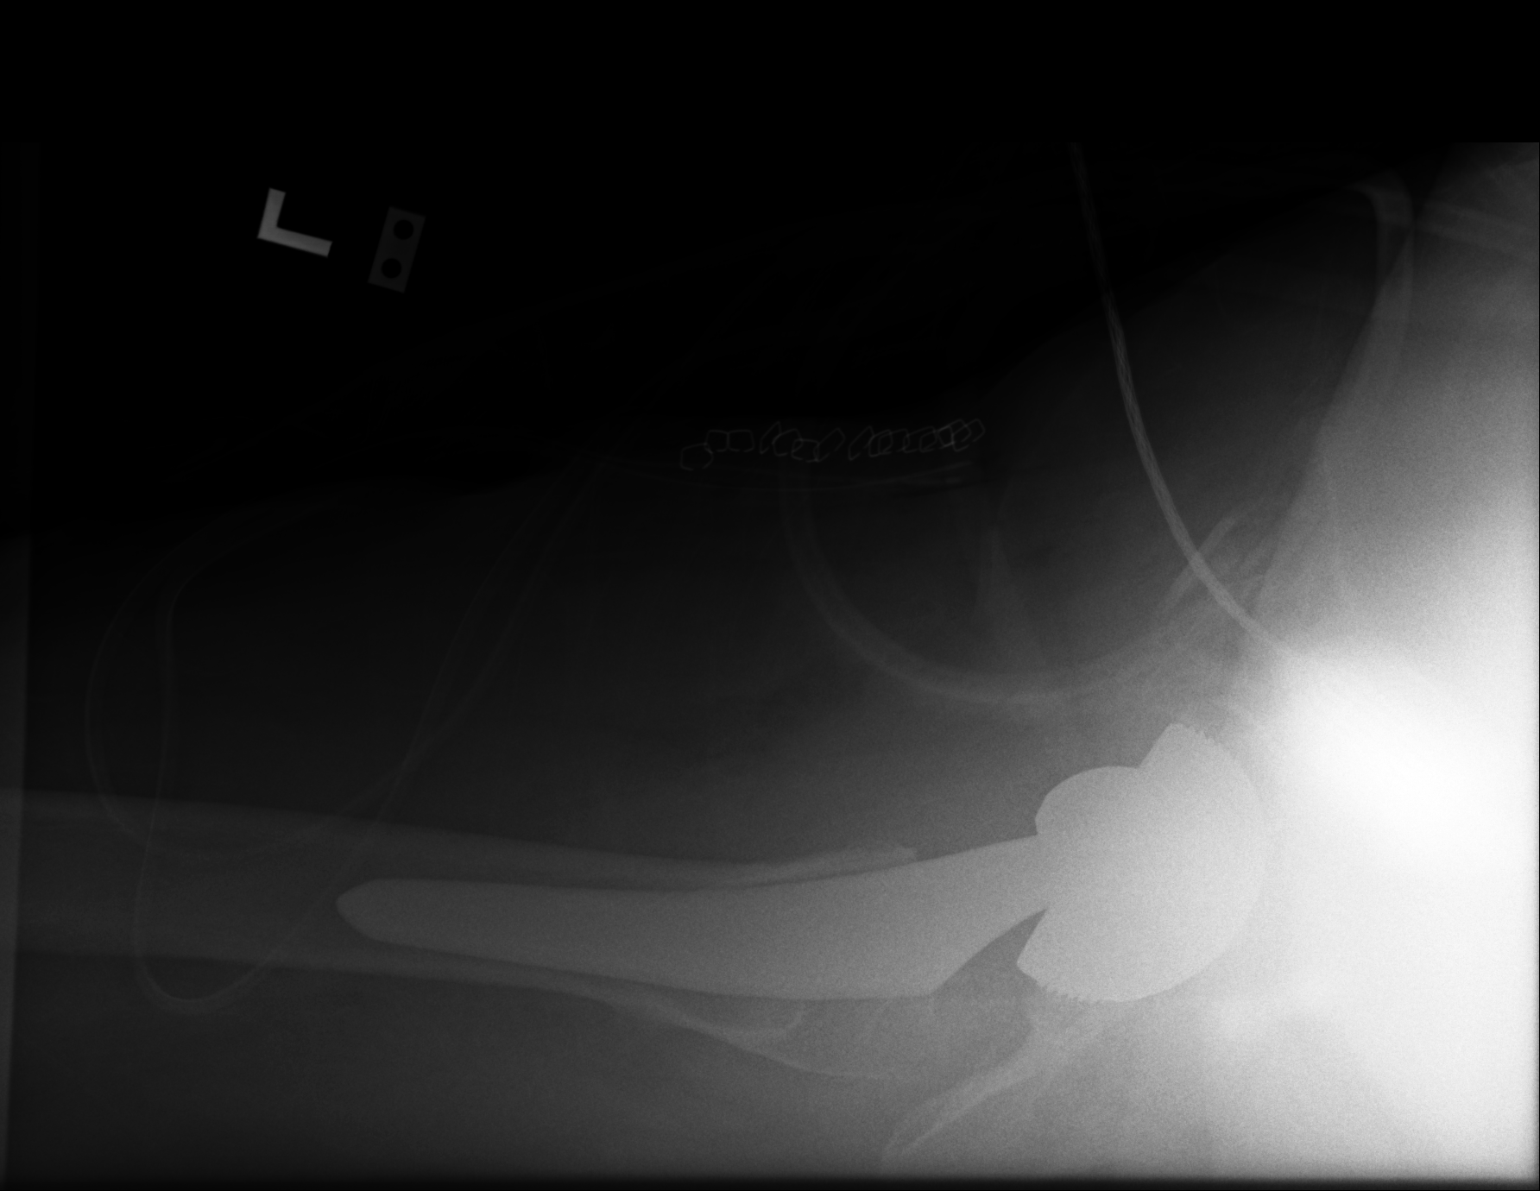

[2 of 2 positions shown; findings below may reference images not displayed]

IMPRESSION: The patient is undergoing left total hip joint replacement.

[REDACTED]

## 2013-04-03 ENCOUNTER — Ambulatory Visit (INDEPENDENT_AMBULATORY_CARE_PROVIDER_SITE_OTHER): Payer: Medicare Other | Admitting: Pulmonary Disease

## 2013-04-03 ENCOUNTER — Encounter: Payer: Self-pay | Admitting: Pulmonary Disease

## 2013-04-03 VITALS — BP 116/64 | HR 81 | Temp 98.0°F | Ht 64.0 in | Wt 121.0 lb

## 2013-04-03 DIAGNOSIS — J441 Chronic obstructive pulmonary disease with (acute) exacerbation: Secondary | ICD-10-CM | POA: Insufficient documentation

## 2013-04-03 MED ORDER — PREDNISONE 10 MG PO TABS: 10.0000 mg | ORAL_TABLET | Freq: Every day | ORAL | Status: DC

## 2013-04-03 MED ORDER — PREDNISONE 10 MG PO TABS: 20.0000 mg | ORAL_TABLET | Freq: Every day | ORAL | Status: AC

## 2013-04-03 MED ORDER — AZITHROMYCIN 250 MG PO TABS: ORAL_TABLET | ORAL | Status: DC

## 2013-04-03 MED ORDER — BENZONATATE 200 MG PO CAPS: 200.0000 mg | ORAL_CAPSULE | Freq: Three times a day (TID) | ORAL | Status: DC | PRN

## 2013-04-03 NOTE — Patient Instructions (Signed)
Take the zpack as written Take the prednisone 20mg  daily for 5 days Use the tessalon perles three times a day as needed for cough Use your symbicort twice a day We will see you back in 6 months or sooner if needed

## 2013-04-03 NOTE — Assessment & Plan Note (Signed)
Kerri Carter is having a mild exacerbation of COPD. I'm glad she came in today. Aside from this episode she's been doing very well on the Symbicort.  Plan: -Extend prednisone at 20 mg for 5 more days  - Z-Pak -Continue Symbicort -Followup with me in 6 months or center if needed

## 2013-04-03 NOTE — Progress Notes (Signed)
Subjective:    Patient ID: Kerri Carter, female    DOB: Jul 30, 1940, 73 y.o.   MRN: [id number]  Synopsis: Kerri Carter established care for COPD at the [facility] office in 08/2011.  Had PFTs in 04/2011 in March 2013 which showed: Ratio 70%, clear obstruction on flow volume loop, FEV1 1.79 L (81% predicted.), Total lung capacity 5.3 L (115% predicted), residual volume 2.49 L (136% predicted), DLCO 84% predicted. She smoked 0.5packs per day for 20 years.    HPI   04/03/2013 ROV >> Kerri Carter has been coughing more since yesterday morning.  She feels tightness in her chest and she has a lot of chest congestion.  She has produced yellow sputum.  She has not had sick contacts.  She completed a course of prednisone last week for an allergy which caused swelling in her face.  She has had chills, but no fevers.  No body aches, headache, but a lot of fatigue noted.  She doesn't use the symbicort regularly.  She often forgets to use it.  She used albuterol 4 times yesterday for shortness of breath.   Past Medical History  Diagnosis Date  . Hypothyroidism   . HTN (hypertension)   . CRI (chronic renal insufficiency)   . Hyperlipidemia   . Diabetes mellitus   . Asthma   . Angioedema   . Anaphylaxis   . Osteoarthritis     hands, low titer rheumatoid factor, carpal tunnel  . Carpal tunnel syndrome      Review of Systems  Constitutional: Negative for chills, activity change and fatigue.  Respiratory: Positive for cough, shortness of breath and wheezing. Negative for stridor.   Cardiovascular: Negative for chest pain, palpitations and leg swelling.       Objective:   Physical Exam   Filed Vitals:   04/03/13 1053  BP: 116/64  Pulse: 81  Temp: 98 F (36.7 C)  TempSrc: Oral  Height: 5\' 4"  (1.626 m)  Weight: 121 lb (54.885 kg)  SpO2: 98%  RA  Gen: mildly ill appearing, no acute distress HEENT: NCAT, EOMi, OP clear,  PULM: Slight exp wheezing CV: RRR, no mgr, no JVD AB: BS+, soft, nontender,  no hsm Ext: warm, no edema, no clubbing, no cyanosis      Assessment & Plan:   COPD exacerbation Kerri Carter is having a mild exacerbation of COPD. I'm glad she came in today. Aside from this episode she's been doing very well on the Symbicort.  Plan: -Extend prednisone at 20 mg for 5 more days  - Z-Pak -Continue Symbicort -Followup with me in 6 months or center if needed    Updated Medication List Outpatient Encounter Prescriptions as of 04/03/2013  Medication Sig  . albuterol (VENTOLIN HFA) 108 (90 BASE) MCG/ACT inhaler Inhale 2 puffs into the lungs every 6 (six) hours as needed.  Marland Kitchen aspirin 81 MG tablet Take 81 mg by mouth daily.  . budesonide-formoterol (SYMBICORT) 160-4.5 MCG/ACT inhaler Inhale 2 puffs into the lungs 2 (two) times daily.  . chlorthalidone (HYGROTON) 25 MG tablet Take 12.5 mg by mouth daily.  Marland Kitchen gabapentin (NEURONTIN) 100 MG capsule Take 1 capsule by mouth 3 (three) times daily.  Marland Kitchen HYDROcodone-acetaminophen (VICODIN) 5-500 MG per tablet Take 1 tablet by mouth every 4 (four) hours as needed (cough).  . hydroxychloroquine (PLAQUENIL) 200 MG tablet Take 1 tablet by mouth 2 (two) times daily.   Marland Kitchen KLOR-CON 10 10 MEQ tablet Take 1 tablet by mouth daily.  Marland Kitchen lovastatin (ALTOPREV) 20 MG 24  hr tablet Take 20 mg by mouth at bedtime.  . montelukast (SINGULAIR) 10 MG tablet Take 10 mg by mouth daily.   . nebivolol (BYSTOLIC) 10 MG tablet Take 1 tablet (10 mg total) by mouth daily.  . pantoprazole (PROTONIX) 40 MG tablet Take 40 mg by mouth 2 (two) times daily.   . QUEtiapine (SEROQUEL) 50 MG tablet Take 50 mg by mouth at bedtime.  . ranitidine (ZANTAC) 150 MG tablet Take 150 mg by mouth 2 (two) times daily.  Marland Kitchen saccharomyces boulardii (FLORASTOR) 250 MG capsule Take 250 mg by mouth as directed.  . zolpidem (AMBIEN CR) 12.5 MG CR tablet Take 12.5 mg by mouth at bedtime as needed.  . [DISCONTINUED] potassium chloride (K-DUR,KLOR-CON) 10 MEQ tablet Take 10 mEq by mouth daily.

## 2013-04-04 ENCOUNTER — Ambulatory Visit: Payer: Medicare Other | Admitting: Pulmonary Disease

## 2013-05-09 ENCOUNTER — Telehealth: Payer: Self-pay | Admitting: Pulmonary Disease

## 2013-05-09 MED ORDER — NEBIVOLOL HCL 10 MG PO TABS: 10.0000 mg | ORAL_TABLET | Freq: Every day | ORAL | Status: DC

## 2013-05-09 NOTE — Telephone Encounter (Signed)
Pt aware RX has been sent for bystolic. Nothing further needed

## 2013-06-07 DIAGNOSIS — M1710 Unilateral primary osteoarthritis, unspecified knee: Secondary | ICD-10-CM | POA: Insufficient documentation

## 2013-06-07 DIAGNOSIS — M179 Osteoarthritis of knee, unspecified: Secondary | ICD-10-CM | POA: Insufficient documentation

## 2013-06-13 DIAGNOSIS — F419 Anxiety disorder, unspecified: Secondary | ICD-10-CM | POA: Insufficient documentation

## 2013-06-13 DIAGNOSIS — F32A Depression, unspecified: Secondary | ICD-10-CM | POA: Insufficient documentation

## 2013-06-14 ENCOUNTER — Ambulatory Visit: Payer: Self-pay | Admitting: Orthopedic Surgery

## 2013-07-03 ENCOUNTER — Ambulatory Visit: Payer: Self-pay | Admitting: Orthopedic Surgery

## 2013-07-03 LAB — URINALYSIS, COMPLETE
Bacteria: NONE SEEN
Bilirubin,UR: NEGATIVE
Blood: NEGATIVE
Glucose,UR: NEGATIVE mg/dL (ref 0–75)
Hyaline Cast: 2
Ketone: NEGATIVE
Leukocyte Esterase: NEGATIVE
Nitrite: NEGATIVE
Ph: 6 (ref 4.5–8.0)
Protein: NEGATIVE
RBC,UR: <1 /HPF (ref 0–5)
Specific Gravity: 1.008 (ref 1.003–1.030)
Squamous Epithelial: NONE SEEN
WBC UR: <1 /HPF (ref 0–5)

## 2013-07-03 LAB — CBC
HCT: 36.8 % (ref 35.0–47.0)
HGB: 12 g/dL (ref 12.0–16.0)
MCH: 28.2 pg (ref 26.0–34.0)
MCHC: 32.7 g/dL (ref 32.0–36.0)
MCV: 86 fL (ref 80–100)
Platelet: 174 10*3/uL (ref 150–440)
RBC: 4.27 10*6/uL (ref 3.80–5.20)
RDW: 14.1 % (ref 11.5–14.5)
WBC: 8 10*3/uL (ref 3.6–11.0)

## 2013-07-03 LAB — BASIC METABOLIC PANEL
Anion Gap: 2 — ABNORMAL LOW (ref 7–16)
BUN: 18 mg/dL (ref 7–18)
Calcium, Total: 9 mg/dL (ref 8.5–10.1)
Chloride: 98 mmol/L (ref 98–107)
Co2: 35 mmol/L — ABNORMAL HIGH (ref 21–32)
Creatinine: 1.25 mg/dL (ref 0.60–1.30)
EGFR (African American): 50 — ABNORMAL LOW
EGFR (Non-African Amer.): 44 — ABNORMAL LOW
Glucose: 84 mg/dL (ref 65–99)
Osmolality: 271 (ref 275–301)
Potassium: 4.1 mmol/L (ref 3.5–5.1)
Sodium: 135 mmol/L — ABNORMAL LOW (ref 136–145)

## 2013-07-03 LAB — PROTIME-INR
INR: 0.9
Prothrombin Time: 12.4 secs (ref 11.5–14.7)

## 2013-07-03 LAB — SEDIMENTATION RATE: Erythrocyte Sed Rate: 10 mm/hr (ref 0–30)

## 2013-07-03 LAB — MRSA PCR SCREENING

## 2013-07-03 LAB — APTT: Activated PTT: 30.7 secs (ref 23.6–35.9)

## 2013-07-05 LAB — URINE CULTURE

## 2013-07-13 ENCOUNTER — Inpatient Hospital Stay: Payer: Self-pay | Admitting: Orthopedic Surgery

## 2013-07-14 LAB — BASIC METABOLIC PANEL
Anion Gap: 4 — ABNORMAL LOW (ref 7–16)
BUN: 10 mg/dL (ref 7–18)
Calcium, Total: 7.7 mg/dL — ABNORMAL LOW (ref 8.5–10.1)
Chloride: 105 mmol/L (ref 98–107)
Co2: 27 mmol/L (ref 21–32)
Creatinine: 1.19 mg/dL (ref 0.60–1.30)
EGFR (African American): 54 — ABNORMAL LOW
EGFR (Non-African Amer.): 46 — ABNORMAL LOW
Glucose: 112 mg/dL — ABNORMAL HIGH (ref 65–99)
Osmolality: 272 (ref 275–301)
Potassium: 3.8 mmol/L (ref 3.5–5.1)
Sodium: 136 mmol/L (ref 136–145)

## 2013-07-14 LAB — PATHOLOGY REPORT

## 2013-07-14 LAB — HEMOGLOBIN: HGB: 10.4 g/dL — ABNORMAL LOW (ref 12.0–16.0)

## 2013-07-14 LAB — PLATELET COUNT: Platelet: 158 10*3/uL (ref 150–440)

## 2013-07-15 LAB — BASIC METABOLIC PANEL
Anion Gap: 7 (ref 7–16)
BUN: 7 mg/dL (ref 7–18)
Calcium, Total: 8.2 mg/dL — ABNORMAL LOW (ref 8.5–10.1)
Chloride: 94 mmol/L — ABNORMAL LOW (ref 98–107)
Co2: 27 mmol/L (ref 21–32)
Creatinine: 0.81 mg/dL (ref 0.60–1.30)
EGFR (African American): >60
EGFR (Non-African Amer.): >60
Glucose: 126 mg/dL — ABNORMAL HIGH (ref 65–99)
Osmolality: 257 (ref 275–301)
Potassium: 3.5 mmol/L (ref 3.5–5.1)
Sodium: 128 mmol/L — ABNORMAL LOW (ref 136–145)

## 2013-07-15 LAB — HEMOGLOBIN: HGB: 11.2 g/dL — ABNORMAL LOW (ref 12.0–16.0)

## 2013-07-16 LAB — SODIUM: Sodium: 133 mmol/L — ABNORMAL LOW (ref 136–145)

## 2013-07-17 LAB — BASIC METABOLIC PANEL
Anion Gap: 5 — ABNORMAL LOW (ref 7–16)
BUN: 12 mg/dL (ref 7–18)
Calcium, Total: 8.3 mg/dL — ABNORMAL LOW (ref 8.5–10.1)
Chloride: 103 mmol/L (ref 98–107)
Co2: 28 mmol/L (ref 21–32)
Creatinine: 0.86 mg/dL (ref 0.60–1.30)
EGFR (African American): >60
EGFR (Non-African Amer.): >60
Glucose: 93 mg/dL (ref 65–99)
Osmolality: 271 (ref 275–301)
Potassium: 3.7 mmol/L (ref 3.5–5.1)
Sodium: 136 mmol/L (ref 136–145)

## 2013-07-20 DIAGNOSIS — Z9889 Other specified postprocedural states: Secondary | ICD-10-CM | POA: Insufficient documentation

## 2013-07-20 DIAGNOSIS — Z96659 Presence of unspecified artificial knee joint: Secondary | ICD-10-CM | POA: Insufficient documentation

## 2013-07-20 HISTORY — DX: Other specified postprocedural states: Z98.890

## 2013-07-21 ENCOUNTER — Observation Stay: Payer: Self-pay | Admitting: Internal Medicine

## 2013-07-21 LAB — COMPREHENSIVE METABOLIC PANEL
Albumin: 2.4 g/dL — ABNORMAL LOW (ref 3.4–5.0)
Alkaline Phosphatase: 240 U/L — ABNORMAL HIGH
Anion Gap: 7 (ref 7–16)
BUN: 20 mg/dL — ABNORMAL HIGH (ref 7–18)
Bilirubin,Total: 0.6 mg/dL (ref 0.2–1.0)
Calcium, Total: 8.9 mg/dL (ref 8.5–10.1)
Chloride: 97 mmol/L — ABNORMAL LOW (ref 98–107)
Co2: 28 mmol/L (ref 21–32)
Creatinine: 1.09 mg/dL (ref 0.60–1.30)
EGFR (African American): 60 — ABNORMAL LOW
EGFR (Non-African Amer.): 51 — ABNORMAL LOW
Glucose: 103 mg/dL — ABNORMAL HIGH (ref 65–99)
Osmolality: 267 (ref 275–301)
Potassium: 4.2 mmol/L (ref 3.5–5.1)
SGOT(AST): 48 U/L — ABNORMAL HIGH (ref 15–37)
SGPT (ALT): 49 U/L (ref 12–78)
Sodium: 132 mmol/L — ABNORMAL LOW (ref 136–145)
Total Protein: 6.8 g/dL (ref 6.4–8.2)

## 2013-07-21 LAB — URINALYSIS, COMPLETE
Bilirubin,UR: NEGATIVE
Blood: NEGATIVE
Glucose,UR: NEGATIVE mg/dL (ref 0–75)
Ketone: NEGATIVE
Nitrite: NEGATIVE
Ph: 5 (ref 4.5–8.0)
Protein: NEGATIVE
RBC,UR: <1 /HPF (ref 0–5)
Specific Gravity: 1.018 (ref 1.003–1.030)
Squamous Epithelial: 12
WBC UR: 31 /HPF (ref 0–5)

## 2013-07-21 LAB — CBC
HCT: 27.2 % — ABNORMAL LOW (ref 35.0–47.0)
HGB: 9 g/dL — ABNORMAL LOW (ref 12.0–16.0)
MCH: 28.1 pg (ref 26.0–34.0)
MCHC: 33 g/dL (ref 32.0–36.0)
MCV: 85 fL (ref 80–100)
Platelet: 438 10*3/uL (ref 150–440)
RBC: 3.19 10*6/uL — ABNORMAL LOW (ref 3.80–5.20)
RDW: 13.9 % (ref 11.5–14.5)
WBC: 10.1 10*3/uL (ref 3.6–11.0)

## 2013-07-21 LAB — TROPONIN I: Troponin-I: <0.02 ng/mL

## 2013-07-22 LAB — CBC WITH DIFFERENTIAL/PLATELET
Basophil #: 0 10*3/uL (ref 0.0–0.1)
Basophil %: 0.5 %
Eosinophil #: 0.3 10*3/uL (ref 0.0–0.7)
Eosinophil %: 4.3 %
HCT: 23.2 % — ABNORMAL LOW (ref 35.0–47.0)
HGB: 7.6 g/dL — ABNORMAL LOW (ref 12.0–16.0)
Lymphocyte #: 1.4 10*3/uL (ref 1.0–3.6)
Lymphocyte %: 18.1 %
MCH: 27.7 pg (ref 26.0–34.0)
MCHC: 32.8 g/dL (ref 32.0–36.0)
MCV: 84 fL (ref 80–100)
Monocyte #: 0.8 x10 3/mm (ref 0.2–0.9)
Monocyte %: 10.2 %
Neutrophil #: 5.3 10*3/uL (ref 1.4–6.5)
Neutrophil %: 66.9 %
Platelet: 405 10*3/uL (ref 150–440)
RBC: 2.76 10*6/uL — ABNORMAL LOW (ref 3.80–5.20)
RDW: 14.2 % (ref 11.5–14.5)
WBC: 8 10*3/uL (ref 3.6–11.0)

## 2013-07-22 LAB — BASIC METABOLIC PANEL
Anion Gap: 7 (ref 7–16)
BUN: 18 mg/dL (ref 7–18)
Calcium, Total: 8.4 mg/dL — ABNORMAL LOW (ref 8.5–10.1)
Chloride: 101 mmol/L (ref 98–107)
Co2: 26 mmol/L (ref 21–32)
Creatinine: 0.98 mg/dL (ref 0.60–1.30)
EGFR (African American): >60
EGFR (Non-African Amer.): 58 — ABNORMAL LOW
Glucose: 87 mg/dL (ref 65–99)
Osmolality: 270 (ref 275–301)
Potassium: 3.9 mmol/L (ref 3.5–5.1)
Sodium: 134 mmol/L — ABNORMAL LOW (ref 136–145)

## 2013-07-23 LAB — HEMOGLOBIN: HGB: 8.5 g/dL — ABNORMAL LOW (ref 12.0–16.0)

## 2013-07-24 LAB — URINE CULTURE
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
## 2013-07-26 LAB — CULTURE, BLOOD (SINGLE)
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
## 2013-10-05 ENCOUNTER — Ambulatory Visit: Payer: Self-pay | Admitting: Internal Medicine

## 2013-11-24 ENCOUNTER — Emergency Department: Payer: Self-pay | Admitting: Emergency Medicine

## 2013-11-24 LAB — URINALYSIS, COMPLETE
Bacteria: NONE SEEN
Bilirubin,UR: NEGATIVE
Blood: NEGATIVE
Glucose,UR: NEGATIVE mg/dL (ref 0–75)
Ketone: NEGATIVE
Leukocyte Esterase: NEGATIVE
Nitrite: NEGATIVE
Ph: 7 (ref 4.5–8.0)
Protein: NEGATIVE
RBC,UR: <1 /HPF (ref 0–5)
Specific Gravity: 1.003 (ref 1.003–1.030)
Squamous Epithelial: <1
WBC UR: 2 /HPF (ref 0–5)

## 2013-11-24 LAB — COMPREHENSIVE METABOLIC PANEL
Albumin: 3.9 g/dL (ref 3.4–5.0)
Alkaline Phosphatase: 87 U/L
Anion Gap: 4 — ABNORMAL LOW (ref 7–16)
BUN: 13 mg/dL (ref 7–18)
Bilirubin,Total: 0.3 mg/dL (ref 0.2–1.0)
Calcium, Total: 9 mg/dL (ref 8.5–10.1)
Chloride: 105 mmol/L (ref 98–107)
Co2: 31 mmol/L (ref 21–32)
Creatinine: 1.26 mg/dL (ref 0.60–1.30)
EGFR (African American): 54 — ABNORMAL LOW
EGFR (Non-African Amer.): 44 — ABNORMAL LOW
Glucose: 115 mg/dL — ABNORMAL HIGH (ref 65–99)
Osmolality: 280 (ref 275–301)
Potassium: 3.7 mmol/L (ref 3.5–5.1)
SGOT(AST): 19 U/L (ref 15–37)
SGPT (ALT): 17 U/L
Sodium: 140 mmol/L (ref 136–145)
Total Protein: 7.1 g/dL (ref 6.4–8.2)

## 2013-11-24 LAB — CK TOTAL AND CKMB (NOT AT ARMC)
CK, Total: 58 U/L
CK-MB: 0.7 ng/mL (ref 0.5–3.6)

## 2013-11-24 LAB — CBC
HCT: 37 % (ref 35.0–47.0)
HGB: 11.6 g/dL — ABNORMAL LOW (ref 12.0–16.0)
MCH: 26.5 pg (ref 26.0–34.0)
MCHC: 31.3 g/dL — ABNORMAL LOW (ref 32.0–36.0)
MCV: 85 fL (ref 80–100)
Platelet: 188 10*3/uL (ref 150–440)
RBC: 4.37 10*6/uL (ref 3.80–5.20)
RDW: 14.5 % (ref 11.5–14.5)
WBC: 8.8 10*3/uL (ref 3.6–11.0)

## 2013-11-24 LAB — TROPONIN I: Troponin-I: <0.02 ng/mL

## 2014-03-08 ENCOUNTER — Emergency Department: Payer: Self-pay | Admitting: Emergency Medicine

## 2014-03-09 ENCOUNTER — Telehealth: Payer: Self-pay | Admitting: Pulmonary Disease

## 2014-03-09 MED ORDER — NEBIVOLOL HCL 10 MG PO TABS: 10.0000 mg | ORAL_TABLET | Freq: Every day | ORAL | Status: DC

## 2014-03-09 NOTE — Telephone Encounter (Signed)
Spoke with pt, refilled bystolic and scheduled for annual rov.  Nothing further needed.

## 2014-04-09 ENCOUNTER — Ambulatory Visit: Payer: Self-pay | Admitting: Orthopedic Surgery

## 2014-04-25 ENCOUNTER — Encounter: Payer: Self-pay | Admitting: Pulmonary Disease

## 2014-04-25 ENCOUNTER — Ambulatory Visit (INDEPENDENT_AMBULATORY_CARE_PROVIDER_SITE_OTHER): Payer: Medicare Other | Admitting: Pulmonary Disease

## 2014-04-25 VITALS — BP 116/62 | HR 63 | Ht 64.0 in | Wt 128.0 lb

## 2014-04-25 DIAGNOSIS — J432 Centrilobular emphysema: Secondary | ICD-10-CM

## 2014-04-25 NOTE — Patient Instructions (Signed)
Use the symbicort twice a day if you get a cold Use the albuterol as needed for shortness of breath or chest tightness We will see you back in 6 months or sooner if needed

## 2014-04-25 NOTE — Progress Notes (Signed)
Subjective:    Patient ID: Kerri Carter, female    DOB: [birth year], 74 y.o.   MRN: [id number]  Synopsis: Kerri Carter established care for COPD at the [facility] office in 08/2011.  Had PFTs in 04/2011 in March 2013 which showed: Ratio 70%, clear obstruction on flow volume loop, FEV1 1.79 L (81% predicted.), Total lung capacity 5.3 L (115% predicted), residual volume 2.49 L (136% predicted), DLCO 84% predicted. She smoked 0.5packs per day for 20 years.    HPI  Chief Complaint  Patient presents with  . Follow-up    Pt has no breathing complaints at this time.  CAT score 17.     Kerri Carter says she had a wonderful winter and only had one "little coughing spell" which she treated with OTC remedies.  She used the sybmicort around this time but she only used it then when the had the cough.  She has not had to use it much since then. She used the tessalon perles.  She has not used the albuterol. She has not had trouble with the pollen causing itching eyes, scratching throat, etc.  She had a lot of family members in the family who were sick recently.     Past Medical History  Diagnosis Date  . Hypothyroidism   . HTN (hypertension)   . CRI (chronic renal insufficiency)   . Hyperlipidemia   . Diabetes mellitus   . Asthma   . Angioedema   . Anaphylaxis   . Osteoarthritis     hands, low titer rheumatoid factor, carpal tunnel  . Carpal tunnel syndrome      Review of Systems  Constitutional: Negative for chills, activity change and fatigue.  Respiratory: Positive for cough, shortness of breath and wheezing. Negative for stridor.   Cardiovascular: Negative for chest pain, palpitations and leg swelling.       Objective:   Physical Exam  Filed Vitals:   04/25/14 1345  BP: 116/62  Pulse: 63  Height: 5\' 4"  (1.626 m)  Weight: 128 lb (58.06 kg)  SpO2: 95%  RA  Gen: mildly ill appearing, no acute distress HEENT: NCAT, EOMi, OP clear,  PULM: Slight exp wheezing CV: RRR, no mgr, no  JVD AB: BS+, soft, nontender, no hsm Ext: warm, no edema, no clubbing, no cyanosis      Assessment & Plan:   COPD (chronic obstructive pulmonary disease) This has been a stable interval for Kerri Carter.  She has mild COPD and doesn't need to use a controller medicine every day.    Plan: -use albuterol prn -use the symbicort bid if and when she gets a cold, otherwise she does not need to use it continuously -f/u 6 months     Updated Medication List Outpatient Encounter Prescriptions as of 04/25/2014  Medication Sig  . albuterol (VENTOLIN HFA) 108 (90 BASE) MCG/ACT inhaler Inhale 2 puffs into the lungs every 6 (six) hours as needed.  Marland Kitchen aspirin 81 MG tablet Take 81 mg by mouth daily.  . benzonatate (TESSALON) 200 MG capsule Take 1 capsule (200 mg total) by mouth 3 (three) times daily as needed for cough.  . budesonide-formoterol (SYMBICORT) 160-4.5 MCG/ACT inhaler Inhale 2 puffs into the lungs 2 (two) times daily. (Patient taking differently: Inhale 2 puffs into the lungs 2 (two) times daily as needed. )  . folic acid (FOLVITE) 1 MG tablet Take 1 mg by mouth daily.  Marland Kitchen gabapentin (NEURONTIN) 100 MG capsule Take 3 capsules by mouth 3 (three) times daily.   Marland Kitchen  HYDROcodone-acetaminophen (VICODIN) 5-500 MG per tablet Take 1 tablet by mouth every 4 (four) hours as needed (cough).  . lovastatin (ALTOPREV) 20 MG 24 hr tablet Take 20 mg by mouth at bedtime.  . montelukast (SINGULAIR) 10 MG tablet Take 10 mg by mouth daily.   . nebivolol (BYSTOLIC) 10 MG tablet Take 1 tablet (10 mg total) by mouth daily.  . pantoprazole (PROTONIX) 40 MG tablet Take 40 mg by mouth 2 (two) times daily.   . predniSONE (DELTASONE) 5 MG tablet Take 5 mg by mouth daily with breakfast.  . ranitidine (ZANTAC) 150 MG tablet Take 150 mg by mouth 2 (two) times daily.  Marland Kitchen saccharomyces boulardii (FLORASTOR) 250 MG capsule Take 250 mg by mouth as needed.   . [DISCONTINUED] KLOR-CON 10 10 MEQ tablet Take 1 tablet by mouth daily.  .  [DISCONTINUED] QUEtiapine (SEROQUEL) 50 MG tablet Take 50 mg by mouth at bedtime.  . [DISCONTINUED] zolpidem (AMBIEN CR) 12.5 MG CR tablet Take 12.5 mg by mouth at bedtime as needed.  . [DISCONTINUED] azithromycin (ZITHROMAX) 250 MG tablet Take 500mg  on day one, then take 250mg  po daily  . [DISCONTINUED] chlorthalidone (HYGROTON) 25 MG tablet Take 12.5 mg by mouth daily.  . [DISCONTINUED] hydroxychloroquine (PLAQUENIL) 200 MG tablet Take 1 tablet by mouth 2 (two) times daily.

## 2014-04-25 NOTE — Assessment & Plan Note (Signed)
This has been a stable interval for Kerri Carter.  She has mild COPD and doesn't need to use a controller medicine every day.    Plan: -use albuterol prn -use the symbicort bid if and when she gets a cold, otherwise she does not need to use it continuously -f/u 6 months

## 2014-05-22 NOTE — Op Note (Signed)
PATIENT NAME:  Kerri Carter, Kerri Carter MR#:  [id number] DATE OF BIRTH:  09-Jan-1941  DATE OF PROCEDURE:  12/10/2011  PREOPERATIVE DIAGNOSIS: Left hip avascular necrosis.   POSTOPERATIVE DIAGNOSIS: Left hip avascular necrosis.   PROCEDURE: Left total hip replacement.   ANESTHESIA: Spinal.   SURGEON: Laurene Footman, MD  ASSISTANT: Rachelle Hora, PA-C   DESCRIPTION OF PROCEDURE: The patient was brought to the Operating Room, and after adequate anesthesia was obtained the patient was placed onto the operative table with the left foot in the [place] attachment, the right leg in a well legholder, and preop x-ray was taken to assess position and length. The hip was then prepped and draped using the barrier drape method, and appropriate patient identification and timeout procedures were completed. An anterior approach was made in line with the tensor fascia anterolaterally. The subcutaneous tissue was incised, and the tensor fascia incised, and the tensor retracted laterally. The deep fascia was incised and the rectus fascia incised. The lateral femoral circumflex vessels were identified and coagulated before cutting them. The femoral neck cut was then carried out and the femoral head removed. There was some evidence of avascular necrosis present. The acetabulum appeared to be in good condition, and this was with just moderate synovitis. The acetabulum was reamed to 48 mm, and the 48 mm trial fit very well. The 48 mm cup was then impacted and was quite stable. Next, the leg was externally rotated and the posterior capsule released to allow for adequate exposure of the femur. Sequential broaching was carried out and a #2 standard stem gave a very tight fit and was chosen after impacting and placing, doing trials. Next with the 2 stem in place, trials were placed and the short head gave the prior leg length. With the 2 stem in place, a 48 mm dual-mobility liner with S 28 mm head assembled was impacted onto the stem. The  hip was reduced and it was stable. Leg lengths appeared equal. The arthrotomy was repaired using #1 Ethibond.  Subcutaneous tissues and muscle were infiltrated with 0.25% Sensorcaine and morphine. The deep fascia were repaired using heavy quill suture. A Hemovac was drain placed, subcutaneous tissue approximated using quill suture, skin staples, Xeroform, 4 x 4's, ABD and tape. The patient was sent to the recovery room in stable condition.  ESTIMATED BLOOD LOSS: 150.   COMPLICATIONS: None.   SPECIMEN: Femoral head.   ____________________________ Laurene Footman, MD mjm:cbb D: 12/10/2011 22:26:14 ET T: 12/11/2011 10:48:11 ET JOB#: [id number]  cc: Laurene Footman, MD, <Dictator> Laurene Footman MD ELECTRONICALLY SIGNED 12/11/2011 12:32

## 2014-05-22 NOTE — Discharge Summary (Signed)
PATIENT NAME:  Kerri Carter, Kerri Carter MR#:  [id number] DATE OF BIRTH:  02/14/1940  DATE OF ADMISSION:  12/10/2011 DATE OF DISCHARGE:  12/14/2011  ADMITTING DIAGNOSIS: Left hip avascular nephrosis.   DISCHARGE DIAGNOSIS: Left hip avascular necrosis.   PROCEDURE: Left total hip replacement.   ANESTHESIA: Spinal.   SURGEON: Laurene Footman, MD  ASSISTANT: Rachelle Hora, PA-C   COMPLICATIONS: None.   ESTIMATED BLOOD LOSS: 150 mL.   SPECIMENS: Femoral head.   HISTORY: The patient is a 74 year old patient of Dr. Jefm Bryant. She has had a long history of arthritis. She also has significant history of cardiac problems. She has been having significant hip pain and so Dr. Sammuel Bailiff, Jr. had gotten a MRI of the hips that showed extensive avascular necrosis of the left hip. There was mild shortening of the right hip. Past history is negative for any alcohol use and has had occasional steroid use secondary to pulmonary problems. She has pain in the groin that is severe at times and has been having a very difficult time walking. I reviewed the MRI findings with her.   PHYSICAL EXAMINATION: On exam she had severe pain with hip rotation. Leg length appeared equal. She is neurovascularly intact distally with good flexion and extension of the ankle. She has no flexion contracture. I discussed all these findings with her.   HOSPITAL COURSE: The patient was admitted to the hospital on 12/10/2011. She had surgery that same day and was brought to the orthopedic floor from the PAC-U in stable condition. The patient had minimal drop in her hemoglobin on postoperative day one. Her vital signs remained stable. She had progressed slowly to begin with physical therapy, but by 12/14/2011 the patient had made good progress. The patient's vital signs remained stable and she was ready for discharge on 12/14/2011 to home with home health physical therapy.   DISCHARGE INSTRUCTIONS: The patient may gradually increase  weight-bearing on the affected extremity. She is to elevate the affected foot and leg on 1 or 2 pillows with the foot higher than the knee. She needs to wear thigh-high TED hose on both legs and remove at bedtime and replace when arising the next morning. She may resume a regular diet as tolerated and resume typical home medications of Xarelto 10 mg once a day for 31 days, Tylenol 650 to 1000 mg every six hours if needed for pain, and oxycodone 5 to 10 mg every four hours as needed for pain. Wound care - apply an ice pack to the affected area. Do not get the dressing or bandage wet or dirty. Call [place] office if the dressing gets water under it. Leave the dressing on. Symptoms to report - Call [facility] if any of the following occur: bright red bleeding from the incision wound, fever above 101.5 degrees, redness, swelling, or drainage at the incision. Call [facility] if you experience any increased leg pain, numbness, or weakness in your legs or bowel or bladder symptoms. She is referred to home physical therapy. She needs to call [facility] if a therapist has not contacted her within 48 hours of her return home. She needs to schedule a follow-up appointment at [facility] in two weeks for evaluation and staple removal.   DISCHARGE MEDICATIONS:  1. Dulera 200 mcg/5 mcg inhalation aerosol 2 puffs inhaled twice a day. 2. Pantoprazole 40 mg oral delayed release tablet 1 tablet orally twice a day. 3. Ventolin HFA 2 puffs  orally every four hours as needed. 4. Pepto-Bismol 262 mg oral tablet orally as needed for diarrhea.  5. Zolpidem 12.5 mg oral tablet extended-release 1 tablet orally once a day at bedtime. 6. Singulair 10 mg oral tablet 1 tablet orally once a day in the morning.  7. Carvedilol 3.25 mg oral tablet 1 tablet orally twice a day.  8. Florastor 250 mg oral tablet 1 tablet orally once a day as  needed. 9. Furosemide 20 mg oral tablet 1/2 tablet orally once a day. 10. Lovastatin 20 mg oral tablet 1 tablet orally once a day at bedtime.  11. Plaquenil sulfate 200 mg oral tablet 1 tablet orally twice a day. 12. Quetiapine 50 mg oral tablet extended-release 1 tablet orally once a day at bedtime.  13. Multivitamin 1 tablet orally once a day.     14. Klor-Con M-10 oral tablet extended-release 1 tablet orally once a day. 15. Prednisone 5 mg oral tablet 1/2 tab orally once a day in the morning.  16. Ambien CR 12.5 mg oral tablet extended-release 1 tablet orally a day at bedtime.  ____________________________ Duanne Guess, PA-C tcg:slb D: 12/14/2011 07:42:58 ET T: 12/14/2011 12:06:16 ET JOB#: [id number]  cc: Duanne Guess, PA-C, <Dictator>  Duanne Guess PA ELECTRONICALLY SIGNED 12/15/2011 9:30

## 2014-05-26 NOTE — Discharge Summary (Signed)
PATIENT NAME:  Kerri Carter, Kerri Carter MR#:  [id number] DATE OF BIRTH:  Oct 08, 1940  DATE OF ADMISSION:  07/13/2013 DATE OF DISCHARGE:  07/17/2013  ADMITTING DIAGNOSIS: Right knee osteoarthritis and rheumatoid arthritis.  DISCHARGE DIAGNOSES: Right knee osteoarthritis and rheumatoid arthritis.   PROCEDURE PERFORMED: Right total knee replacement.   ANESTHESIA: Spinal.   SURGEON: Laurene Footman, M.D.   ASSISTANT: Reche Dixon, PA-C.   ESTIMATED BLOOD LOSS: 50 mL.   COMPLICATIONS: None.   SPECIMEN: Cut ends of bone.   TOURNIQUET TIME: 68 minutes at 300 mmHg.  IMPLANTS: Medacta GMK sphere, size right 3 femur, 2 right fixed tibia, 2 right 11 mm Flex tibial insert with a size 2 patella. All components cemented.   CONDITION: To recovery room stable.   HISTORY: The patient is a 74 year old female who has had a My Knee CT for right knee pain. The joint has been painful. She describes pain as marked with major pain with significant limitations. Symptoms have been present for 5 years and began immediately following normal activities. Current pain is aggravated constantly at rest. Pain is localized to the medial knee. The lateral knee and anterior knee also become quite painful. In the past, the patient has tried Tylenol, nonsteroidal anti-inflammatory medications, narcotics, and intra-articular steroids with no relief. The patient is currently on or recently tried narcotics. She has tried heat, ice and physical therapy to help with the pain. The pain is relieved by nothing adequately.  PHYSICAL EXAMINATION: GENERAL: No apparent distress, well-nourished, well-developed.  EYES: Pupils equal, round, reactive and synchronous in movement. ENT: Edentulous with upper and lower dentures.  LYMPHATIC: No palpable axillary adenopathy.  RESPIRATORY: Nonlabored breathing. CTA.  HEART: Regular rate and rhythm without murmur.  VASCULAR: No edema, swelling or tenderness. NEUROLOGIC AND PSYCHIATRIC: Normal mood and  affect.  MUSCULOSKELETAL: Normal except as noted in detailed knee exam.  RIGHT KNEE: Shows the patient has an antalgic gait. Alignment is valgus. Inspection normal. Palpation tenderness. Medial joint line crepitus is moderate. Effusion 1+. Flexion 100 degrees. Flexion active 100 degrees. Extension passive 7 degrees. Extension active 5 degrees. Strength with extension 5/5, flexion 5/5. Ligamentous exam is normal. She has positive patellar crepitation. She has 2+ dorsalis pedis pulse and 2+ posterior tibialis pulse. She has no edema, and she has sensation intact to light touch.   HOSPITAL COURSE: The patient was admitted to the hospital on 07/13/2013. She had surgery that same day and was brought to the orthopedic floor from the PAC-U in stable condition. On postop day 1, the patient was doing well. She was in moderate pain. On postop day 2, the patient was found to be hyponatremic with a sodium of 128. Medicine was consulted and she was thought to have hypovolemic hyponatremia. She was started on gentle IV fluids and on postop day 3 her sodium was rechecked and was found to be 133. On postop day 4, sodium was rechecked and found to be 136. The patient's vital signs are stable. She was in moderate pain although she was sleeping. She was afebrile with vital signs stable. She had slow progress with physical therapy. She was stable and ready for discharge to rehab facility for physical therapy and occupational therapy.   CONDITION AT DISCHARGE: Stable.   DISCHARGE INSTRUCTIONS: The patient may gradually increase weight-bearing on the effected extremity. Thigh-high TED hose on both legs and remove at bedtime, replace on arising the next morning. Elevate the heels off the bed. Incentive spirometer every hour while awake. Encourage cough  and deep breathing. The patient may resume a regular diet as tolerated. Do not get the dressing or bandage wet or dirty. Call [facility] orthopedics if the dressing gets water  under it. Change the dressing once daily as needed. Call [facility] orthopedics if any of the following occur: Bright red bleeding from the incisional wound, fever above 101.5 degrees, redness, swelling, or drainage at the incision site. Call [facility] orthopedics if you experience any increased leg pain, numbness or weakness in your legs or bowel or bladder symptoms. Call [facility] orthopedics for a followup appointment. Followup appointment should be in 2 weeks. Please see discharge instructions for discharge medication list. ____________________________ T. Rachelle Hora, PA-C tcg:sb D: 07/17/2013 12:34:24 ET T: 07/17/2013 13:55:25 ET JOB#: [id number]  cc: T. Rachelle Hora, PA-C, <Dictator> Duanne Guess [place] ELECTRONICALLY SIGNED 07/29/2013 0:07

## 2014-05-26 NOTE — Discharge Summary (Signed)
PATIENT NAME:  Kerri Carter, Kerri Carter MR#:  [id number] DATE OF BIRTH:  [birth year]  DATE OF ADMISSION:  07/21/2013 DATE OF DISCHARGE:  07/24/2013  DISCHARGE DIAGNOSES: 1.  Encephalopathy, likely from sedating medications. 2.  Recent total knee replacement.  3.  Hypertension.  4.  Asthma.   DISCHARGE MEDICATIONS: Per [facility] med reconciliation summary. Please see for details. Basically, sedating meds will be held, except for 5 mg p.r.n. oxycodone, which she seems to have tolerated here. Will not give her Seroquel, Valium, OxyContin, etc.   HOSPITAL COURSE: She received what amounts to a full course of treatment for Klebsiella cystitis that was sensitive to Cipro while here, so we will stop that. She has some anemia with hemoglobin of 8.5 yesterday, which seemingly was stable. It was postop and acute blood loss in nature. Other labs were stable. No other relevant facts. Her mental status did improve markedly with changing her meds as noted above. She will be discharged back for further rehabilitation of her knee. All questions about her knee and rehabilitation should be directed to orthopedics who has been caring for that.   TIME SPENT: It took approximately 35 minutes to do discharge tasks today.  ____________________________ Ocie Cornfield. Ouida Sills, MD mwa:sb D: 07/24/2013 07:58:18 ET T: 07/24/2013 08:23:27 ET JOB#: [id number]  cc: Ocie Cornfield. Ouida Sills, MD, <Dictator> Kirk Ruths MD ELECTRONICALLY SIGNED 07/25/2013 7:56

## 2014-05-26 NOTE — Op Note (Signed)
PATIENT NAME:  Kerri Carter, ANES MR#:  [id number] DATE OF BIRTH:  1940-05-19  DATE OF PROCEDURE:  07/13/2013  PREOPERATIVE DIAGNOSIS:  Right knee osteoarthritis and rheumatoid arthritis, right knee.   POSTOPERATIVE DIAGNOSIS:  Right knee osteoarthritis and rheumatoid arthritis, right knee.   PROCEDURE PERFORMED:  Right total knee replacement.   ANESTHESIA:  Spinal.   SURGEON:  Hessie Knows, M.D.  ASSISTANT:  Gardiner Ramus, PA-C.   DESCRIPTION OF PROCEDURE:  The patient was brought to the operating room, and after adequate anesthesia was obtained, the right leg was prepped and draped in the usual sterile fashion with a tourniquet applied to the upper thigh. After patient identification and timeout procedures were completed, the leg was exsanguinated with an Esmarch and tourniquet raised to 300 mmHg. A midline skin incision was made, followed by a medial parapatellar arthrotomy. Inspection of the knee revealed extensive tricompartmental degenerative change with moderate synovitis. The proximal tibia was exposed, and the Medacta tibial cutting block was applied and proximal tibia cut carried out with removal of the bone and menisci. The ACL and PCL were removed at this point. The distal femoral guide was applied after removing some residual cartilage, and the distal femoral cut made, resecting very little laterally where there had been bony erosion. The size 3 femoral cutting block was applied. Anterior, posterior and chamfer cuts made without any notching. The tibial trial tray was then inserted with appropriate rotation based on one of the initial pins off the guide, pinned in place. Drilling was carried out, followed by the keel punch. This was left in place. The femoral trial was placed, and an 11 mm insert gave very good stability through range of motion and was subsequently chosen for implantation. Distal femoral drill holes were made and all the trials were removed. The trochlear groove cut was then made  with the slotted osteotome. Patella was then prepared using the patellar cutting guide and measured a size 2 after drill holes were made. When the tourniquet was let down and hemostasis was achieved with electrocautery, there was minimal bleeding.  At this point, the knee was infiltrated with Exparel diluted with saline and mixture of morphine and 0.25% Sensorcaine with epinephrine. The tourniquet was raised again and the bony surfaces thoroughly irrigated and dried. Size 2 tibial base plate was cemented into place, followed by the 11 mm polyethylene insert, size 3 femoral component was then placed, and the knee held in extension while the patellar button was also cemented into place. After the cement had set and excess cement had been removed, the patella was noted to track well with no touch technique after minimal lateral release with the valgus knee. Arthrotomy was then repaired using a heavy Quill suture, 2-0 Quill subcutaneously and skin staples. Xeroform, 4 x 4's, ABD, Webril and Ace wrap applied.   ESTIMATED BLOOD LOSS: 50 mL.   COMPLICATIONS:  None.   SPECIMEN:  Cut ends of bone.   TOURNIQUET TIME:  68 minutes at 300 mmHg.   IMPLANTS: Medacta GMK sphere, size right 3 femur, 2 right fixed tibia, 2 right 11 mm flex tibial insert with a size 2 patella. All components cemented.   CONDITION:  To recovery room stable with no complications.     ____________________________ Laurene Footman, MD mjm:dmm D: 07/13/2013 12:54:05 ET T: 07/13/2013 13:13:48 ET JOB#: [id number]  cc: Laurene Footman, MD, <Dictator> Laurene Footman MD ELECTRONICALLY SIGNED 07/13/2013 13:39

## 2014-05-26 NOTE — Consult Note (Signed)
Brief Consult Note: Diagnosis: S/P RIGHT TKA.   Patient was seen by consultant.   Comments: WOUND OK, EXTENSIVE BRUISING Has not been eating well, able to tolerate PT.  Electronic Signatures: Laurene Footman (MD)  (Signed 19-Jun-15 17:30)  Authored: Brief Consult Note   Last Updated: 19-Jun-15 17:30 by Laurene Footman (MD)

## 2014-05-26 NOTE — H&P (Signed)
PATIENT NAME:  Kerri Carter, MCQUIRE MR#:  [id number] DATE OF BIRTH:  11-Nov-1940  DATE OF ADMISSION:  07/21/2013  PRIMARY CARE PHYSICIAN: Dr. Frazier Richards  CHIEF COMPLAINT: Altered mental status and lethargy.   HISTORY OF PRESENT ILLNESS: This is a 74 year old female who presents from [facility] facility due to lethargy, weakness and altered mental status.   The patient just recently had a right knee replacement and was discharged to [facility]. As per the husband at bedside, she has been very lethargic and not cooperating and working with physical therapy there. She has also not eaten or drank anything in now over a week. She is more lethargic than she usually is and therefore was sent over to the ER for further evaluation. The patient still continues to have significant pain in the right knee, was seen as a follow-up post surgery at Dr. Theodore Demark office. Due to her pain getting worse, her pain meds were advanced. She now comes in with the above complaints. Incidentally, the patient was also noted to have a urinary tract infection in the Emergency Room. Hospitalist services were contacted for further treatment and evaluation.   REVIEW OF SYSTEMS: CONSTITUTIONAL: No documented fever. Positive fatigue and weakness. No weight gain. No weight loss.  EYES: No blurred or double vision.  ENT: No tinnitus. No postnasal drip. No redness of the oropharynx.  RESPIRATORY: No cough, no wheeze, no hemoptysis, no dyspnea.  CARDIOVASCULAR: No chest pain, no orthopnea, no palpitations, no syncope.  GASTROINTESTINAL: No nausea, no vomiting, no diarrhea, no abdominal pain, no melena or hematochezia.  GENITOURINARY: No dysuria, no hematuria.  ENDOCRINE: No polyuria or nocturia. No heat or cold intolerance. HEMATOLOGIC: No anemia. No bruising. No bleeding.  INTEGUMENTARY: No rashes. No lesions.  MUSCULOSKELETAL: No arthritis, no swelling, no gout.  NEUROLOGIC: No numbness. No tingling. No  ataxia. No seizure-type activity.  PSYCHIATRIC: No anxiety. No insomnia. No ADD.   PAST MEDICAL HISTORY: Consistent with COPD, hypertension, hyperlipidemia, rheumatoid arthritis, and recent right total knee replacement.   ALLERGIES: To multiple meds including ANTIVERT, CEFTIN, IODINATED CONTRAST, DOXYCYCLINE, LEVAQUIN, LISINOPRIL, MORPHINE, XARELTO, TETANUS IMMUNOGLOBULIN, AND BAND-AIDS.   SOCIAL HISTORY: Used to be a smoker, quit many years ago. No alcohol abuse. No illicit drug abuse. Currently resides at a skilled nursing facility.   FAMILY HISTORY: Both mother and father are deceased. Father died from a MI. Mother died from complications of a stroke.   CURRENT MEDICATIONS: Tylenol 500 mg 1 tab q. 4 hours as needed, aspirin 81 mg daily, Dulcolax suppository as needed, Bystolic 5 mg daily, chlorthalidone 25 mg 1/2 tab daily, Dulcolax 100 mg b.i.d., Lovenox 40 mg subcu daily, Florastor 250 mg daily as needed, potassium 10 mEq daily, lovastatin 20 mg at bedtime, magnesium hydroxide oral suspension 30 mL b.i.d. as needed, Neurontin 100 mg t.i.d., oxycodone 5 mg every 4 hours as needed, oxycodone short-acting 10 mg every 4 hours as needed, OxyContin 10 mg b.i.d., Plaquenil 200 mg b.i.d., ranitidine 150 mg b.i.d., Singulair 10 mg daily, Seroquel 50 mg at bedtime, Symbicort 160/4.5 two puffs b.i.d., pindolol 50 mg q. 4 hours as needed, trazodone 50 mg 1 to 3 tabs at bedtime, Valium 5 mg b.i.d., and albuterol inhaler 1 puff every 4 hours as needed.   PHYSICAL EXAMINATION: Presently is as follows:  VITAL SIGNS: Temperature 98.7, pulse 75, respirations 18, blood pressure 115/45, sats 96% on room air.  GENERAL: She is a lethargic and emaciated female in bed, but in no apparent  distress.  HEAD, EYES, EARS, NOSE, THROAT: Atraumatic, normocephalic. Extraocular muscles are intact. Pupils equal and reactive to light. Sclerae anicteric. No conjunctival injection. No pharyngeal erythema.  NECK: Supple. There is no  jugular venous distention. No bruits. No lymphadenopathy or thyromegaly.  HEART: Regular rate and rhythm. No murmurs. No rubs. No clicks.  LUNGS: Clear to auscultation bilaterally. No rales, no rhonchi, no wheezes.  ABDOMEN: Soft, flat, nontender, nondistended. Has good bowel sounds. No hepatosplenomegaly appreciated.  EXTREMITIES: She does have a right knee dressing due to her recent surgery. Otherwise, no cyanosis or clubbing. Posterior pedal and radial pulses bilaterally.  NEUROLOGIC: The patient is alert, awake and oriented x2, globally weak, moves all extremities spontaneously. No other focal motor or sensory deficits appreciated bilaterally.  SKIN: Moist and warm with no rashes appreciated.  LYMPHATIC: There is no cervical or axillary lymphadenopathy.   DIAGNOSTIC DATA: Laboratory exam showed a serum glucose of 103, BUN 20, creatinine 1.09, sodium 132, potassium 4.2, chloride 97, bicarb 28. The patient's LFTs are within normal limits. Troponin less than 0.02. White cell count 10.1, hemoglobin 9, hematocrit 27.2, platelet count 438,000. Urinalysis shows 1+ leukocyte esterase with 3+ bacteria and 31 white cells.   The patient did have a chest x-ray done which showed COPD, but no acute cardiopulmonary disease.   ASSESSMENT AND PLAN: This is a 74 year old female with a history of chronic obstructive pulmonary disease, hypertension, hyperlipidemia, history of rheumatoid arthritis and recent right knee replacement who presents to the hospital due to weakness, lethargy and altered mental status.  1.  Altered mental status. I suspect this is probably related to polypharmacy. The patient is on multiple sedative medications including OxyContin, oxycodone, Nucynta, trazodone and Valium. Also she has a underlying urinary tract infection. For now I will adjust her pain meds by reducing her dose of oxycodone and continuing her OxyContin for now, also reducing dose of her Valium and following her mental status.  We will also treat her underlying urinary tract infection with IV Cipro and follow her clinically.  2.  Urinary tract infection. Give her IV ciprofloxacin, follow urine cultures. 3.  Hypertension. The patient is presently hemodynamically stable. Continue Bystolic.  4.  Hyperlipidemia. Continue lovastatin. 5.  History of rheumatoid arthritis. Continue Plaquenil. 6.  History of recent right total knee replacement. The patient is still having significant pain in the right knee. I will get an orthopedic consult to further address this. Also get a physical therapy consult to assess the mobility. Continue deep vein thrombosis prophylaxis with Lovenox. 7.  Chronic obstructive pulmonary disease. No acute exacerbation. Continue Symbicort.   The patient is a FULL code. She will be transferred to Dr. Tonette Bihari service.  TIME SPENT: 50 minutes.   ____________________________ Belia Heman. Verdell Carmine, MD vjs:sb D: 07/21/2013 15:42:20 ET T: 07/21/2013 16:56:06 ET JOB#: [id number]  cc: Belia Heman. Verdell Carmine, MD, <Dictator> Henreitta Leber MD ELECTRONICALLY SIGNED 08/01/2013 20:31

## 2014-05-26 NOTE — Consult Note (Signed)
Carter NAME:  Kerri Carter, Kerri Carter MR#:  [id number] DATE OF BIRTH:  06/13/1940  DATE OF CONSULTATION:    REFERRING PHYSICIAN:  Laurene Footman, MD CONSULTING PHYSICIAN:  Jette Lewan A. Posey Pronto, MD  PRIMARY CARE PHYSICIAN:  Ocie Cornfield. Ouida Sills, MD  REASON FOR CONSULTATION: Hyponatremia postop day 1.  Kerri Carter is a 74 year old Caucasian female with multiple medical problems who was admitted on Kerri orthopedic service, on  June 11 underwent elective right knee total arthroplasty secondary to severe degenerative arthritis. Today is postop day 2. Kerri Carter was noted to have sodium of 128. Her sodium on admission on June 11 was 136. Internal medicine was consulted for Kerri same. Kerri Carter is currently sleepy, lethargic secondary to Kerri pain meds. She has been complaining of pain on Kerri right knee. She is currently not receiving any IV fluids. IV fluids were discontinued June 11 in Kerri evening. Per family, she apparently has been eating and drinking relatively well.   PAST MEDICAL HISTORY: 1.  COPD/asthma.  2.  Hypertension.  3.  Hypercholesterolemia.  4.  History of type 2 diabetes in Kerri past.  5.  Hypothyroidism.   PAST SURGICAL HISTORY:  1.  Right-sided nephrectomy in 1988 secondary to aneurysm of Kerri right renal artery.  2.  Tubal ligation.  3.  Appendectomy.  4.  Tonsillectomy.  5.  C-section.  6.  Bunionectomy.  7.  Left hip replacement.   SOCIAL HISTORY: She is married with 2 children. Former smoker. No history of alcohol use.   FAMILY HISTORY: Significant for stroke and diabetes in mother. Father had CHF, CAD. Has 2 sisters with CAD, status post CABG, and CVA. Has a sister with lung cancer.   REVIEW OF SYSTEMS: Not much obtainable since Kerri Carter appears to be quite lethargic from her pain medication. Only complaint is pain on Kerri surgical knee, which is her right knee.   PHYSICAL EXAMINATION: GENERAL: Kerri Carter appears to be somewhat sleepy arousable, answers simple basic questions.   VITAL SIGNS: She is afebrile. Pulse is 77. Blood pressure 168/66. Sats are 98% on room air.  HEENT: Atraumatic, normocephalic. Pupils: PERRLA. EOM intact. Oral mucosa is dry.  NECK: Supple. No JVD. No carotid bruits.  RESPIRATORY: Clear to auscultation bilaterally. No rales, rhonchi, respiratory distress or labored breathing.  HEART: Both Kerri heart sounds are normal. Rate, rhythm regular. PMI not lateralized. Chest nontender. Good pedal pulses, good femoral pulses. No lower extremity edema.  ABDOMEN: Soft, benign, nontender. No organomegaly. Positive bowel sounds.  NEUROLOGIC: Unable to perform much; however, Kerri Carter is able to move all extremities on verbal commands. No focal neuro deficit.   PSYCHIATRIC: Kerri Carter appears to be sleepy, lethargic. Awakens on verbal commands and answers simple questions.   LABORATORY DATA: Hemoglobin is 11.2. Creatinine is 0.81, BUN is 7, glucose is 126, sodium is 128, potassium is 3.5, chloride is 94, bicarbonate is 27, calcium is 8.2. Sodium on June 12 was 136.   ASSESSMENT AND PLAN: A 74 year old Kerri Carter with history of hypertension,  hyperlipidemia, chronic obstructive pulmonary disease/asthma, admitted on Kerri orthopedic services June 11, underwent elective right knee total arthroplasty. Internal medicine was consulted for:  1.  Acute hyponatremia, which is suspected to be due to mild clinical dehydration. Kerri Carter also is on chlorthalidone, which has been discontinued at present. Her blood pressure is stable. Will give some IV fluids. Monitor sodium closely and urine input and output as well. PO hydration is encouraged and discussed with family  members. Will check her TSH if it has not been checked.  2.  Status post right knee total arthroplasty secondary to degenerative joint disease hip, postoperative day 2, per Dr. Rudene Christians.  3.  Hypertension. I will hold off on chlorthalidone, however, will continue her Bystolic. PRN hydralazine if needed.  4.   History of rheumatoid arthritis. Continue Plaquenil.  5.  Chronic obstructive pulmonary disease/asthma. Continue her Singulair and inhaler, Symbicort. Recommend incentive spirometer.  6.  Deep vein thrombosis prophylaxis per Dr. Rudene Christians. Kerri Carter is on Lovenox 30 mg subcutaneous b.i.d.  7.  Lethargy with sleepiness, likely secondary to pain medications. I have decreased Kerri frequency of her pain medication. She is arousable at this time. Follows some of Kerri verbal commands, and her sats are stable.   Thank you for Kerri consult. We will follow while Kerri Carter is in-house. Further work-up according to Kerri Carter's clinical course. Above was discussed with Kerri Carter's family members in Kerri room.   TIME SPENT: 55 minutes.   ____________________________ Hart Rochester Posey Pronto, MD sap:jcm D: 07/15/2013 12:11:46 ET T: 07/15/2013 13:50:55 ET JOB#: [id number]  cc: Blandon Offerdahl A. Posey Pronto, MD, <Dictator> Ocie Cornfield. Ouida Sills, MD Ilda Basset MD ELECTRONICALLY SIGNED 07/17/2013 14:21

## 2014-05-27 NOTE — Discharge Summary (Signed)
PATIENT NAME:  Kerri Carter, Kerri Carter MR#:  [id number] DATE OF BIRTH:  Oct 01, 1940  DATE OF ADMISSION:  03/16/2011 DATE OF DISCHARGE:  03/30/2011  DISCHARGE DIAGNOSES:  1. Acute respiratory failure. 2. Prolonged asthma exacerbation, likely with chronic obstructive pulmonary disease component.  3. Extreme deconditioning.  4. Malnutrition causing marked weakness and shortness of breath on exertion.  5. Dizziness and orthostatic hypotension.  6. Resistant depression, now improved with treatment.  7. Insomnia, marked, making above worse.  8. Poor LV function on Myoview scan but normal LV function on echocardiogram and cardiac catheterization with minimal coronary artery disease noted. 9. Severe thrush causing inability to eat.   DISCHARGE MEDICATIONS:  1. Lovastatin 20 mg daily. (That had been held while she was on Diflucan).  2. Dexamethasone 0.5 mg p.o. twice a day for five days, then stop, this was used for asthma flare in lieu of prednisone as she was allergic to that.  3. Singulair 10 mg daily.  4. Protonix 40 mg daily.  5. Aspirin 81 mg daily.  6. Nystatin suspension 5 mL every six hours for five more days.  7. Benadryl 25 mg p.o. every four hours p.r.n. itching and rash, she does have intermittently chronic hives and urticaria.  8. Atrovent 2 puffs every 6 hours p.r.n. shortness of breath.  9. Bisacodyl suppository p.r.n.  10. Lovenox 40 mg subcutaneous daily for two weeks.  11. Ambien CR 12.5 mg at bedtime p.r.n. sleep, which should not be used when she leaves the facility. She understands long-term use of this could increase her risk of death and this was explained to her again today.  12. Seroquel 50 mg p.o. at bedtime for resistant depression, which has seemed to improve things here versus generally improving clinically.   HISTORY AND PHYSICAL: Please see detailed history and physical done on admission.   HOSPITAL COURSE: The patient was admitted very weak and has eaten almost none for  more than a month with continued cough and shortness of breath. She had a big workup for her shortness of breath and cough all of which was essentially negative, including CT of her chest, V/Q scan, and chest x-ray. She did have abnormal Myoview with a normal echocardiogram. Cardiac catheterization was done per Dr. Nehemiah Massed and was normal. She was seen by pulmonary who did not have further suggestions, other than continuing to taper her steroids, continuing her rehab which is being done and arrange as an outpatient secondary to her only being able to walk 100 feet or less. Her lovastatin was held and is now being restarted secondary to severe thrush. She is eating a little better now and that has seemed to help her general situation. I think her dizziness and weakness is mostly because of that. She had marked weight loss. With that she needed to have all of her antihypertensives held, including marked diuretics, carvedilol, etc. She has thigh high TED hose on which has helped her orthostatic symptoms as well. It may take her at least a few weeks to improve her nutrition, ability to walk, etc. Follow up with Dr. Raul Del for consideration of further pulmonary hypertension workup may be necessary if she does not improve as expected.   TIME SPENT: It took approximately 35 minutes to get discharge tasks today.  ____________________________ Ocie Cornfield. Ouida Sills, MD mwa:slb D: 03/30/2011 07:52:00 ET T: 03/30/2011 08:24:55 ET JOB#: [id number]  cc: Ocie Cornfield. Ouida Sills, MD, <Dictator> Kirk Ruths MD ELECTRONICALLY SIGNED 03/30/2011 10:24

## 2014-05-27 NOTE — Consult Note (Signed)
PATIENT NAME:  Kerri Carter, Kerri Carter MR#:  [id number] DATE OF BIRTH:  1940/11/21  DATE OF CONSULTATION:  03/23/2011  REFERRING PHYSICIAN:  Frazier Richards, MD CONSULTING PHYSICIAN:  Rudell Cobb. Loletta Specter, MD  HISTORY: Kerri Carter is a 74 year old right-handed married white patient of Dr. Frazier Richards with history of hypertension, hyperlipidemia, adult onset diabetes mellitus, remote 1-1/2 to 1 pack per day smoking, chronic obstructive pulmonary disease/asthma, chronic renal insufficiency with baseline creatinine 1.5 status post 1988 right nephrectomy for renal artery aneurysm, hypothyroidism, history of allergies in the past with anaphylaxis and angioedema, and history of depression associated with finding of pulmonary nodule on chest x-ray two years prior to admission, benefiting from Elavil 10 mg at night. She was admitted 03/16/2011 for dehydration secondary to poor oral intake with 15 pound weight loss, associated with persistent bout of exacerbation of chronic obstructive pulmonary disease with asthma and bronchitis beginning early January 2013, and development of abdominal discomfort, nausea and vomiting found in mid January associated with colitis on CT scan. She is referred for evaluation of confusion. History comes from the patient, her hospital chart, office notes from Dr. Ouida Sills in the [facility] computer, and from her hospital record.   The patient presented to the emergency room at 2 o'clock  p.m. on 03/16/2011 after being seen by Dr. Ouida Sills with the above history. In the hospital, she has had gradual improvement of symptoms, and of confusion noted by Dr. Ouida Sills. She is now having problems with lightheaded and dizziness along with shortness of breath, and dyspnea on exertion with walking more than short distances in the hospital. Her blood pressure was somewhat low this morning at 97/62 with heart rate of 71. On orthostatic vital sign checks, mean arterial pressure lying was 70 with heart  rate 71 and mean arterial blood pressure was 63 with heart rate 73 seated. Mean arterial pressure was 72 with heart rate 84 standing. Her Elavil was discontinued today. She has been started on Seroquel 25 mg at night. She denies history of bipolar disorder. She denies problems with any significant depression before problems three years ago when learning of pulmonary nodule. She had a CT scan that did not show a malignancy. She has history of lung cancer in a family member.  PHYSICAL EXAMINATION: The patient is a well-developed, well-nourished white woman who was pleasant and cooperative and in no apparent distress examined lying semisupine with blood pressure 105/60 and heart rate 76. There was no fever. She was normocephalic without evidence of trauma and her neck was supple. She was alert and fully oriented with the exception she was off by one day on date.  She had clear speech and normal expression and was lucid and a good historian with normal affect. On Mini-Mental state examination, she scored 20 out of 30 points, within normal limits. She lost one-point for attempt at copying a cube figure, 1/2 point for correct date, and 1/2 point on testing of 2 minute recall. She did well with calculations and correctly figured in her head the number of nickels in 85 cents and expected change when described a grocery store purchase. Cranial nerve examination was normal including eye movements and visual fields; visual acuity was not tested. Motor examination showed normal tone and good strength throughout. Extremity coordination was normal. Her gait was not tested.   IMPRESSION:  1. Confusion noted on the day of admission, resolved and concluded secondary to dehydration associated with loss and recent significant illness.  2. Reactive depression and taking  Elavil 10 mg at night for sleep.  3. Lightheaded and dizziness associated dyspnea on exertion with walking in the setting of exacerbation of COPD/asthma. This  symptom may be exacerbated by side effect of amitriptyline, although she is on a low dose.   RECOMMENDATIONS:  1. I agree with her present work-up and treatment in the hospital including imaging and laboratory studies.  2. I agree with discontinuation of amitriptyline.  3. I do not see indication for lumbar puncture, EEG, or brain MRI scan.   I appreciate being asked to see this pleasant and interesting lady.  ____________________________ Rudell Cobb. Loletta Specter, MD prc:slb D: 03/23/2011 12:59:15 ET T: 03/23/2011 13:49:43 ET JOB#: [id number]  cc: Rudell Cobb. Loletta Specter, MD, <Dictator> Linton Flemings MD ELECTRONICALLY SIGNED 03/25/2011 9:03

## 2014-05-27 NOTE — H&P (Signed)
PATIENT NAME:  Kerri Carter, Kerri Carter MR#:  [id number] DATE OF BIRTH:  [birth year]  DATE OF ADMISSION:  03/16/2011  PRIMARY CARE PHYSICIAN: Dr. Frazier Richards  CHIEF COMPLAINT: Persistent cough, shortness of breath and nausea x1 month.   HISTORY OF PRESENT ILLNESS: Ms. Stout is a 74 year old female with medical history significant for diabetes mellitus, hypertension, hypothyroidism and chronic renal insufficiency with a baseline creatinine of 1.5 came into [facility] today with persistent cough, fatigue, malaise and nausea for over a month. She has been seen multiple times in the past month by different physicians and providers at [facility]. She was initially seen at the beginning of January with cough and was treated with doxycycline. She had continued symptoms including chest discomfort and diaphoresis. Holter monitor was obtained which just showed PVCs but no acute changes nor arrhythmia. She went to the Emergency Room towards the end of January with nausea, vomiting and abdominal pain. She was found to have colitis on CT scan. She was then treated with Septra and Flagyl. She went to an acute care clinic and was seen for bronchitis and Septra was stopped. She was started on Biaxin. She was seen at [facility] one week ago. She had had a colonoscopy and upper endoscopy the week before due to the abdominal pain, colitis and persistent nausea. There were no findings on the colonoscopy. When she was seen she was having respiratory congestion, wheezing and shortness of breath on 02/04. She was given prednisone taper as well as Dulera for inhaler. No further antibiotics were given at that time. On presentation to [facility] today, however, she was having increasing cough which was dry, wheezing and shortness of breath. Symptoms were unchanged by Biaxin and prednisone. She got to the point where she is unable to eat or drink. She is having soreness in her mouth. She had lost 15 pounds since the  beginning of January. Appeared to be ill when examined. Her vital signs were stable, however, labs were obtained and renal function was worsening. BUN was 30 with a creatinine of 1.7 which is worse from her baseline. Due to her dehydration and inability to take liquids or foods p.o. she will be admitted and treated for dehydration as well as persistent chronic obstructive pulmonary disease exacerbation.   PAST MEDICAL HISTORY:  1. Type 2 diabetes mellitus.  2. Hypertension.  3. Hyperlipidemia.  4. Hypothyroidism.  5. Chronic renal insufficiency secondary to nephrectomy.  6. Allergies/anaphylaxis with angioedema and the past.  7. Chronic obstructive pulmonary disease/asthma.   PAST SURGICAL HISTORY:  1. Right nephrectomy in 1988 secondary to aneurysm of the right renal artery.  2. Tubal ligation in the remote past.  3. Appendectomy in the remote past.  4. Tonsillectomy remote past.  5. Cesarean section.  6. Bunionectomy.   SOCIAL HISTORY: Married with two children. Is a former smoker. Denies alcohol.   FAMILY HISTORY: Significant for stroke and diabetes in her mother. Father had congestive heart failure, coronary artery disease. Both parents are deceased. Has two sisters with coronary artery disease status post CABG and cerebrovascular accident. Two brothers with complications of MI. Has a sister with lung cancer.   REVIEW OF SYSTEMS: CONSTITUTIONAL: No documented fever. Positive for 15 pound weight loss in one month. EYES: No double vision. No blurred vision. No drainage from the eyes. ENT: No ear pain, tinnitus. Positive for some post nasal drip. RESPIRATORY: Positive for cough, wheezing, shortness of breath. No hemoptysis. CARDIOVASCULAR: No chest pain, palpitations, syncope, PND, orthopnea,  edema. GASTROINTESTINAL: Positive for nausea. No vomiting. No diarrhea. Occasional abdominal pain otherwise per history of present illness no melena or hematochezia. GENITOURINARY: No dysuria, hematuria.  ENDOCRINE: Positive for diaphoresis and hot flashes. No polyuria, nocturia. HEME: No anemia or easy bruising. SKIN: No rashes or lesions. MUSCULOSKELETAL: No synovitis, myalgias, arthralgias. NEUROLOGICAL: No numbness or tingling into the extremities. PSYCH: No anxiety, depression.   PHYSICAL EXAMINATION:  GENERAL: Ill-appearing laying on the examination table.   VITAL SIGNS: Temperature 97.5, pulse 72, respirations 16, blood pressure 122/64, oxygen saturation 96% on room air suggesting normal oxygenation, weight 151 which is down from 166 as of 01/04.   HEENT: Pupils equal, round, reactive to light and accommodation. Conjunctiva are pink. Sclera nonicteric. Bilateral TMs are clear. Posterior pharynx shows white patchy exudate on tongue and buccal mucosa. Throat is erythematous.   NECK: No cervical lymphadenopathy. No JVD or carotid bruits auscultated.   HEART: Regular rate rhythm. No murmur, rub, gallop.   LUNGS: Coarse rhonchi heard throughout with fair air movement. No obvious consolidation.   ABDOMEN: Positive bowel sounds all four quadrants. Soft, mildly tender generally without rebound or guarding.   EXTREMITIES: No clubbing, cyanosis or edema.   NEUROLOGIC: No focal findings.   LABORATORY, DIAGNOSTIC AND RADIOLOGICAL DATA: Chest x-ray shows some blunting at the left cardiac border. No obvious infiltrate, pleural effusion or masses noted. CBC: White blood cell count 13.1, hemoglobin 14.9, hematocrit 44, platelets 349. MET-C shows BUN 30, creatinine 1.7, albumin 4.2, alkaline phosphatase 76, total bilirubin 0.5, calcium 9.7, CO2 31.8, chloride 100, glucose 112, potassium 4.1, AST 13, ALT 14, sodium 136, estimated GFR 30. Urinalysis is negative, many squamous cells noted.   CLINICAL IMPRESSION:  1. Dehydration with worsening renal function. The patient only has one kidney secondary to nephrectomy. Is not able to take in p.o. food or water due to thrush currently.  2. Persistent  bronchitis/asthma exacerbation. Has had multiple antibiotics over the past month and prednisone. Will admit and give Solu-Medrol as well as nebulized treatments.  3. Thrush.  4. Significant weight loss in one month. Will investigate other possible underlying reasons for weight loss other than recent thrush.  5. Nausea. This has been ongoing over the past month. Has been on multiple antibiotics. Has had upper endoscopy as well as colonoscopy in the past month.   PLAN:  1. The patient will be admitted, given IV fluids and observed over the next 24 hours.  2. Will give Solu-Medrol 80 mg q.8 hours for chronic obstructive pulmonary disease exacerbation.  3. Xopenex nebulizers every four hours as needed.  4. Nystatin liquid swish and swallow q.i.d.   5. Will consider dietary consult for weight loss.  6. Will consider other imaging studies if needed for evaluation of significant weight loss.  7. Will treat nausea with Zofran 4 mg IV every eight hours p.r.n.  8. Will continue to monitor renal function closely with serial labs.   This patient was seen, re-examined, and all medical decisions were made by Dr. Frazier Richards.  ____________________________ Paulita Cradle, PA-C mm:cms D: 03/16/2011 16:12:12 ET T: 03/16/2011 16:57:32 ET JOB#: [id number]  cc: Paulita Cradle, PA-C, <Dictator> Jenniferlynn Saad J Raeanne Deschler PA ELECTRONICALLY SIGNED 03/18/2011 12:03

## 2014-05-27 NOTE — Consult Note (Signed)
Present Illness elderly female with known previous coronary artery disease, cardiomyopathy, significant mitral valvular insufficiency, hypertension, who has had significant episodes of weakness, fatigue, diaphoresis and mild chest discomfort .  These occur with physical activity, mainly, but occasionally at rest and are more progressive over the last several weeks.  This is possibly secondary to rhythm disturbances.  Today her heart rate is within normal limits and in normal sinus rhythm.  The patient has had no overt congestive heart failure.  Today, although she has had a mild diagnosis of mild LV systolic dysfunction.  Recent echocar mild LV systolic dysfunction with stress tests, also suggesting global myocardial ischemia and LV systolic dysfunction.  The patient is comfortable at this time  Family history No family members with early onset of cardiovascular disease  Social history Patient currently denies alcohol or tobacco use   Physical Exam:   GEN WD    HEENT pink conjunctivae    NECK No masses    RESP no use of accessory muscles  crackles    CARD Regular rate and rhythm  Murmur    Murmur Systolic    Systolic Murmur axilla    ABD denies tenderness  soft    LYMPH negative neck    EXTR negative cyanosis/clubbing    SKIN No rashes    NEURO cranial nerves intact    PSYCH alert   Review of Systems:   Subjective/Chief Complaint I am very short of breath and I have sweaty spells    Respiratory: Short of breath    Review of Systems: All other systems were reviewed and found to be negative    Medications/Allergies Reviewed Medications/Allergies reviewed     HTN:    Hypercholesterolemia:    Asthma:    D&C - Dilation and Curretage:    Tonsillectomy and Adenoidectomy:    Tubal Ligation:    C-Section:    Appendectomy:    Nephrectomy:        Admit Diagnosis:   DEHYDRATION: 26-Mar-2011, Active, DEHYDRATION  Home Medications: Medication Instructions  Status  Atrovent HFA 17 mcg/inh inhalation aerosol 2 puff(s) inhaled 4 times a day Active  ASA 34m  Active  amitriptyline 10 mg oral tablet 1 tab(s) orally once a day (at bedtime) Active  lovastatin 20 mg oral tablet 1 tab(s) orally once a day Active  Singulair 10 mg oral tablet 1 tab(s) orally once a day (in the evening) Active  florstor 250   once a day Active  spironolactone 50 mg oral tablet tab(s) orally once a day (at bedtime) Active  diltiazem 180 mg/24 hours oral tablet, extended release 1 tab(s) orally once a day Active  torsemide 20 mg oral tablet 1 tab(s) orally once a day Active  carvedilol 25 mg oral tablet 1 tab(s) orally once a day (at bedtime) Active  pantoprazole 40 mg oral delayed release tablet 1 tab(s) orally 2 times a day Active   Routine Chem:  12-Feb-13 03:03    Glucose, Serum 175   BUN 31   Creatinine (comp) 1.55   Sodium, Serum 141   Potassium, Serum 4.4   Chloride, Serum 102   CO2, Serum 26   Calcium (Total), Serum 8.8   Anion Gap 13   Osmolality (calc) 292   eGFR (African American) 43   eGFR (Non-African American) 35  Routine Hem:  12-Feb-13 03:03    WBC (CBC) 14.4   RBC (CBC) 4.75   Hemoglobin (CBC) 14.0   Hematocrit (CBC) 41.6   Platelet  Count (CBC) 294   MCV 88   MCH 29.5   MCHC 33.7   RDW 14.2   Neutrophil % 87.6   Lymphocyte % 12.0   Monocyte % 0.3   Eosinophil % 0.1   Basophil % 0.0   Neutrophil # 12.6   Lymphocyte # 1.7   Monocyte # 0.0   Eosinophil # 0.0   Basophil # 0.0  Routine Chem:  13-Feb-13 05:17    Glucose, Serum 154   BUN 23   Creatinine (comp) 1.26   Sodium, Serum 145   Potassium, Serum 4.2   Chloride, Serum 111   CO2, Serum 23   Calcium (Total), Serum 8.3   Anion Gap 11   Osmolality (calc) 295   eGFR (African American) 54   eGFR (Non-African American) 45  Routine Hem:  13-Feb-13 05:17    WBC (CBC) 26.5   RBC (CBC) 4.24   Hemoglobin (CBC) 12.3   Hematocrit (CBC) 37.3   Platelet Count (CBC) 255   MCV 88    MCH 29.0   MCHC 33.0   RDW 13.9   Neutrophil % 94.7   Lymphocyte % 5.2   Monocyte % 0.1   Eosinophil % 0.0   Basophil % 0.0   Neutrophil # 25.1   Lymphocyte # 1.4   Monocyte # 0.0   Eosinophil # 0.0   Basophil # 0.0  Routine Chem:  14-Feb-13 04:49    Glucose, Serum 150   BUN 20   Creatinine (comp) 1.20   Sodium, Serum 145   Potassium, Serum 4.2   Chloride, Serum 112   CO2, Serum 20   Calcium (Total), Serum 8.1   Anion Gap 13   Osmolality (calc) 294   eGFR (African American) 58   eGFR (Non-African American) 47  Routine Hem:  14-Feb-13 04:49    WBC (CBC) 24.5   RBC (CBC) 4.06   Hemoglobin (CBC) 11.8   Hematocrit (CBC) 35.7   Platelet Count (CBC) 260   MCV 88   MCH 29.0   MCHC 32.9   RDW 14.2   Neutrophil % 94.1   Lymphocyte % 5.3   Monocyte % 0.6   Eosinophil % 0.0   Basophil % 0.0   Neutrophil # 23.1   Lymphocyte # 1.3   Monocyte # 0.1   Eosinophil # 0.0   Basophil # 0.0  Routine Chem:  15-Feb-13 06:07    Glucose, Serum 159   BUN 21   Creatinine (comp) 1.12   Sodium, Serum 144   Potassium, Serum 4.4   Chloride, Serum 111   CO2, Serum 20   Calcium (Total), Serum 8.1   Anion Gap 13   Osmolality (calc) 293   eGFR (African American) >60   eGFR (Non-African American) 51  Routine Hem:  15-Feb-13 06:07    WBC (CBC) 20.0   RBC (CBC) 4.40   Hemoglobin (CBC) 12.7   Hematocrit (CBC) 38.5   Platelet Count (CBC) 243   MCV 88   MCH 28.9   MCHC 33.0   RDW 14.3   Neutrophil % 90.4   Lymphocyte % 7.4   Monocyte % 2.0   Eosinophil % 0.0   Basophil % 0.2   Neutrophil # 18.0   Lymphocyte # 1.5   Monocyte # 0.4   Eosinophil # 0.0   Basophil # 0.0  Routine Chem:  16-Feb-13 06:56    Glucose, Serum 137   BUN 22   Creatinine (comp) 1.13   Sodium, Serum 143  Potassium, Serum 4.3   Chloride, Serum 109   CO2, Serum 23   Calcium (Total), Serum 8.2   Anion Gap 11   Osmolality (calc) 290   eGFR (African American) >60   eGFR (Non-African American) 51   Routine Hem:  16-Feb-13 06:56    WBC (CBC) 18.7   RBC (CBC) 4.45   Hemoglobin (CBC) 12.8   Hematocrit (CBC) 39.0   Platelet Count (CBC) 249   MCV 88   MCH 28.8   MCHC 32.9   RDW 13.9   Neutrophil % 87.8   Lymphocyte % 9.1   Monocyte % 3.1   Eosinophil % 0.0   Basophil % 0.0   Neutrophil # 16.4   Lymphocyte # 1.7   Monocyte # 0.6   Eosinophil # 0.0   Basophil # 0.0  Routine Coag:  16-Feb-13 09:52    D-Dimer, Quantitative 1.25  Routine UA:  16-Feb-13 13:59    Color (UA) Yellow   Clarity (UA) Cloudy   Glucose (UA) Negative   Bilirubin (UA) Negative   Ketones (UA) Negative   Specific Gravity (UA) 1.010   Blood (UA) 2+   pH (UA) 8.0   Protein (UA) Negative   Nitrite (UA) Positive   Leukocyte Esterase (UA) 2+   RBC (UA) 25 /HPF   WBC (UA) 26 /HPF   Bacteria (UA) 3+   Epithelial Cells (UA) <1 /HPF   Mucous (UA) PRESENT   Triple Phosphate Crystal (UA) PRESENT  Routine Micro:  16-Feb-13 13:59    Nitrofurantoin Sensitivity R   Cefazolin Sensitivity S   Ampicillin Sensitivity S   Ceftriaxone Sensitivity S   Ciprofloxacin Sensitivity S   Gentamicin Sensitivity S   Imipenem Sensitivity S   Trimethoprim/Sulfamethoxazole Sensitivty R   Lefofloxacin Sensitivity S  LabUnknown:  16-Feb-13 13:59    CEFOXITIN S  Routine Chem:  17-Feb-13 05:16    Glucose, Serum 149   BUN 26   Creatinine (comp) 1.13   Sodium, Serum 142   Potassium, Serum 4.5   Chloride, Serum 108   CO2, Serum 22   Calcium (Total), Serum 8.2   Anion Gap 12   Osmolality (calc) 291   eGFR (African American) >60   eGFR (Non-African American) 51  Routine Hem:  17-Feb-13 05:16    WBC (CBC) 17.9   RBC (CBC) 4.58   Hemoglobin (CBC) 13.3   Hematocrit (CBC) 40.4   Platelet Count (CBC) 264   MCV 88   MCH 29.0   MCHC 32.9   RDW 13.9   Neutrophil % 87.8   Lymphocyte % 8.7   Monocyte % 3.2   Eosinophil % 0.0   Basophil % 0.3   Neutrophil # 15.7   Lymphocyte # 1.6   Monocyte # 0.6   Eosinophil #  0.0   Basophil # 0.1  Routine Chem:  18-Feb-13 04:30    Glucose, Serum 159   BUN 22   Creatinine (comp) 1.01   Sodium, Serum 141   Potassium, Serum 4.6   Chloride, Serum 107   CO2, Serum 21   Calcium (Total), Serum 8.2   Anion Gap 13   Osmolality (calc) 288   eGFR (African American) >60   eGFR (Non-African American) 58  Routine Hem:  18-Feb-13 04:30    WBC (CBC) 20.4   RBC (CBC) 4.70   Hemoglobin (CBC) 13.7   Hematocrit (CBC) 41.3   Platelet Count (CBC) 271   MCV 88   MCH 29.1   MCHC 33.1  RDW 14.0   Neutrophil % 91.5   Lymphocyte % 6.5   Monocyte % 1.9   Eosinophil % 0.0   Basophil % 0.1   Neutrophil # 18.6   Lymphocyte # 1.3   Monocyte # 0.4   Eosinophil # 0.0   Basophil # 0.0  Routine Chem:  19-Feb-13 05:25    Glucose, Serum 169   BUN 26   Creatinine (comp) 1.06   Sodium, Serum 143   Potassium, Serum 4.7   Chloride, Serum 108   CO2, Serum 22   Calcium (Total), Serum 8.0   Anion Gap 13   Osmolality (calc) 294   eGFR (African American) >60   eGFR (Non-African American) 55  Routine Hem:  19-Feb-13 05:25    WBC (CBC) 20.1   RBC (CBC) 4.60   Hemoglobin (CBC) 13.4   Hematocrit (CBC) 40.7   Platelet Count (CBC) 253   MCV 88   MCH 29.0   MCHC 32.8   RDW 14.4   Neutrophil % 90.0   Lymphocyte % 6.8   Monocyte % 3.0   Eosinophil % 0.0   Basophil % 0.2   Neutrophil # 18.1   Lymphocyte # 1.4   Monocyte # 0.6   Eosinophil # 0.0   Basophil # 0.0  Routine Chem:  20-Feb-13 05:32    Glucose, Serum 201   BUN 27   Creatinine (comp) 0.96   Sodium, Serum 140   Potassium, Serum 4.9   Chloride, Serum 105   CO2, Serum 22   Calcium (Total), Serum 8.2   Anion Gap 13   Osmolality (calc) 290   eGFR (African American) >60   eGFR (Non-African American) >60  Routine Hem:  20-Feb-13 05:32    WBC (CBC) 17.1   RBC (CBC) 4.61   Hemoglobin (CBC) 13.4   Hematocrit (CBC) 40.7   Platelet Count (CBC) 259   MCV 88   MCH 29.2   MCHC 33.0   RDW 13.9    Neutrophil % 91.0   Lymphocyte % 6.7   Monocyte % 2.2   Eosinophil % 0.0   Basophil % 0.1   Neutrophil # 15.6   Lymphocyte # 1.2   Monocyte # 0.4   Eosinophil # 0.0   Basophil # 0.0  Cardiology:  20-Feb-13 09:23    Protocol LEXISCAN   Max Work Load 10   Total Exercise Time 60   Max Diastolic BP 73   Max Heart Rate 110   Max Predicted Heart Rate 152  Routine Chem:  21-Feb-13 05:24    Glucose, Serum 165   BUN 29   Creatinine (comp) 1.10   Sodium, Serum 138   Potassium, Serum 4.7   Chloride, Serum 104   CO2, Serum 24   Calcium (Total), Serum 8.6   Anion Gap 10   Osmolality (calc) 285   eGFR (African American) >60   eGFR (Non-African American) 52  Routine Hem:  21-Feb-13 05:24    WBC (CBC) 20.3   RBC (CBC) 4.79   Hemoglobin (CBC) 13.8   Hematocrit (CBC) 42.1   Platelet Count (CBC) 262   MCV 88   MCH 28.8   MCHC 32.8   RDW 14.1   Neutrophil % 90.4   Lymphocyte % 6.6   Monocyte % 2.7   Eosinophil % 0.1   Basophil % 0.2   Neutrophil # 18.3   Lymphocyte # 1.3   Monocyte # 0.5   Eosinophil # 0.0   Basophil # 0.0  Levaquin: Itching, Hives  Contrast - Iodinated Radiocontrast Dye: Rash  Antivert: Other  Prednisone: Other  Benadryl: Other  Doxycycline: GI Distress  Adhesive: Rash  Ceftin: Unknown  Lisinopril: Unknown  Vital Signs/Nurse's Notes: **Vital Signs.:   21-Feb-13 05:05   Vital Signs Type Routine   Temperature Temperature (F) 98   Celsius 36.6   Temperature Source oral   Pulse Pulse 69   Pulse source per Dinamap   Respirations Respirations 20   Systolic BP Systolic BP 388   Diastolic BP (mmHg) Diastolic BP (mmHg) 61   Mean BP 80   BP Source Dinamap   Pulse Ox % Pulse Ox % 96   Pulse Ox Activity Level  At rest   Oxygen Delivery Room Air/ 21 %     Impression elderly female with known coronary artery disease, valvular heart disease.  LV systolic dysfunction, abnormal stress t, hypertension, having episodes that are consistent with  unstable angina, currently without evidence of myocardial infarction    Plan 1.  Proceed to cardiac catheterization to assess coronary anatomy and further treatment thereof as necessary.  Patient understands the risk and benefits of cardiac catheterization.  This includes the possibility of death, stroke, heart attack, infection, bleeding, or blood clot.  Patient is at low-risk for conscious sedation. 2.  Further treatment options.  After about   Electronic Signatures: Corey Skains (MD)  (Signed 21-Feb-13 09:08)  Authored: General Aspect/Present Illness, History and Physical Exam, Review of System, Past Medical History, Health Issues, Home Medications, Labs, Allergies, Vital Signs/Nurse's Notes, Impression/Plan   Last Updated: 21-Feb-13 09:08 by Corey Skains (MD)

## 2014-06-13 IMAGING — CT CT OF THE RIGHT KNEE WITHOUT CONTRAST
3 of 5 series · 14 of 33 positions shown, 17 images · non-contrast
Comparison: None.

CLINICAL DATA: Pre-surgical planning for knee replacement

EXAM:
CT OF THE  KNEE WITHOUT CONTRAST
TECHNIQUE: Multidetector CT imaging of the knee was performed according to the
standard protocol. Multiplanar CT image reconstructions were also
generated.

[Series 4: knee. · axial · 0.29mm/px · z∈[+336,+484]mm · 6 of 208 slices shown, 8 images]
[im 30/208  soft-tissue]
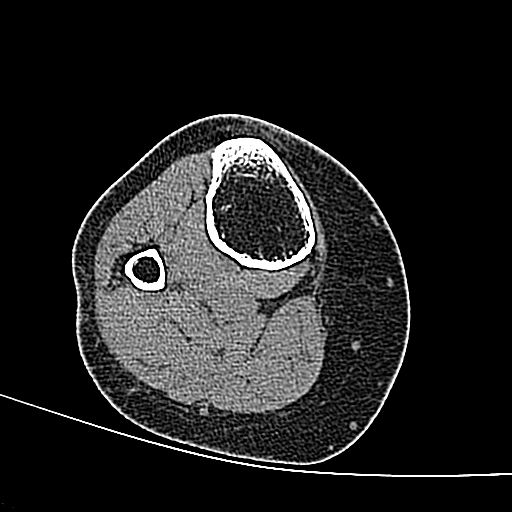
[im 30/208  bone]
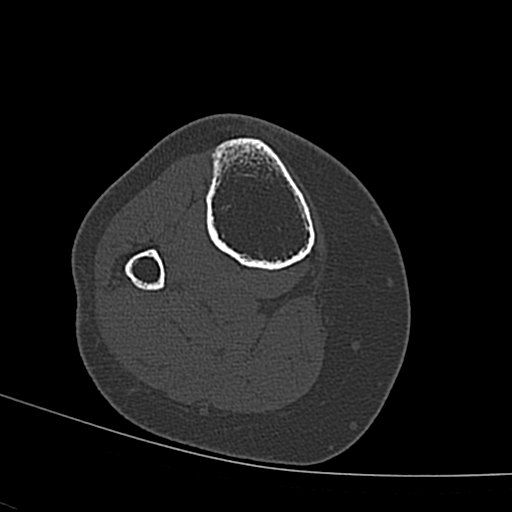
[im 60/208  bone]
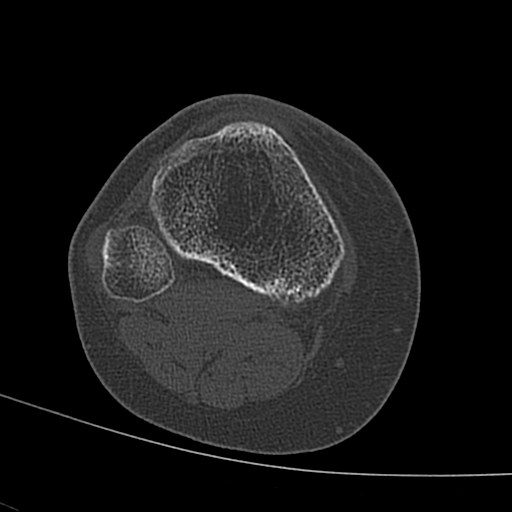
[im 89/208  bone]
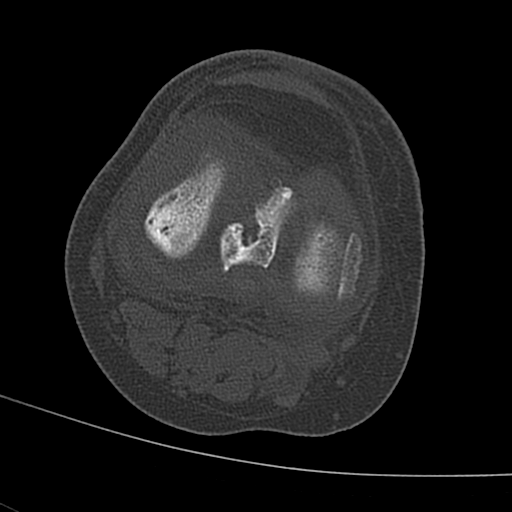
[im 119/208  bone]
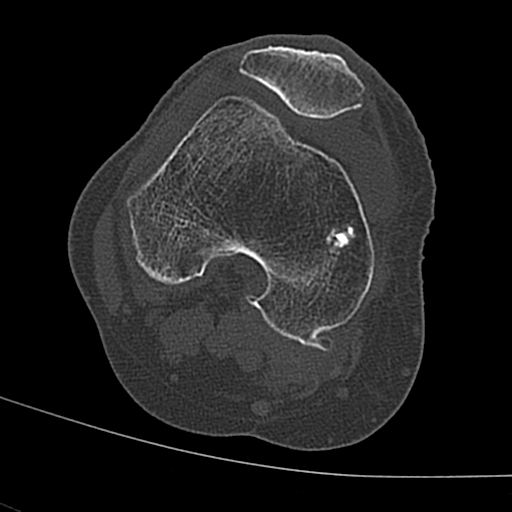
[im 148/208  soft-tissue]
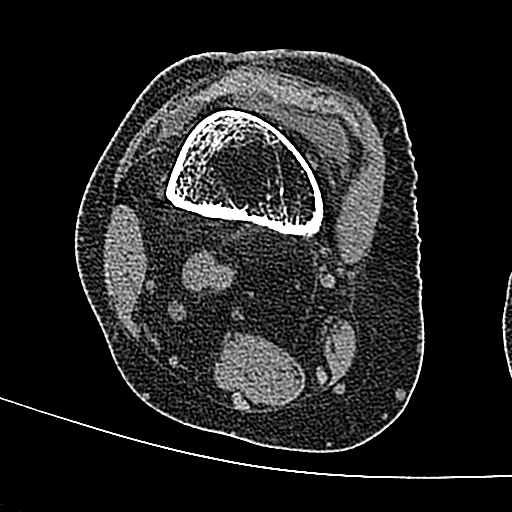
[im 148/208  bone]
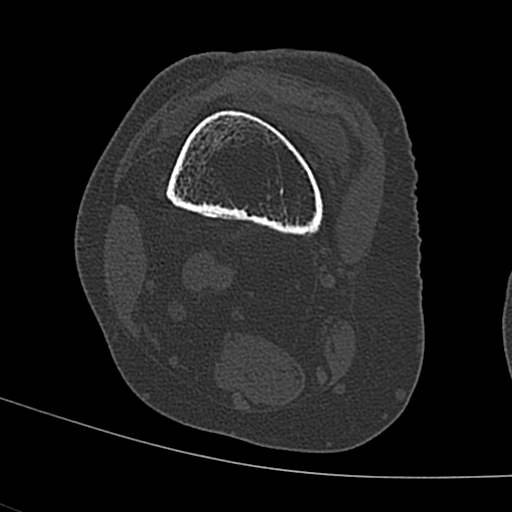
[im 178/208  bone]
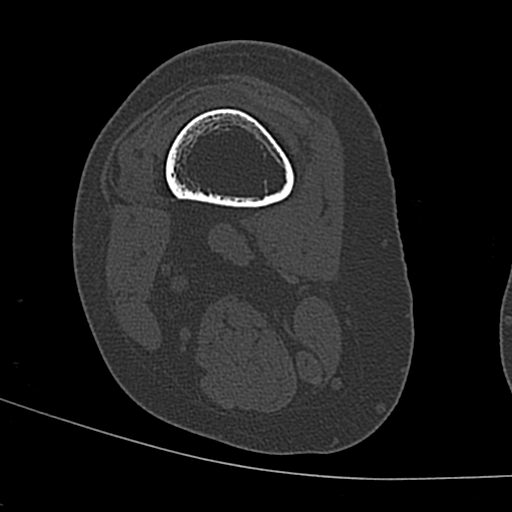

[Series 604: coronal soft tissue · coronal · 0.41mm/px · 3 of 58 slices shown]
[im 12/58  bone]
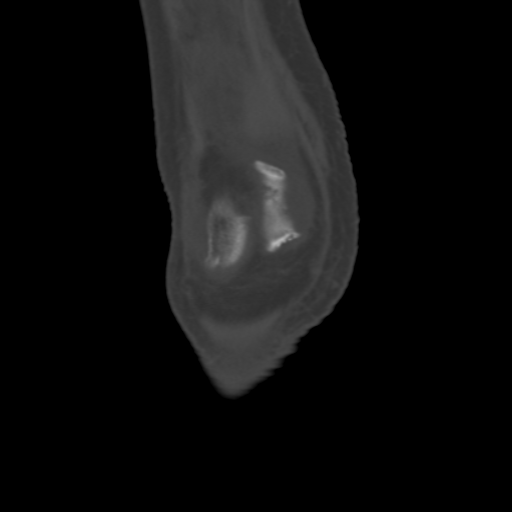
[im 23/58  bone]
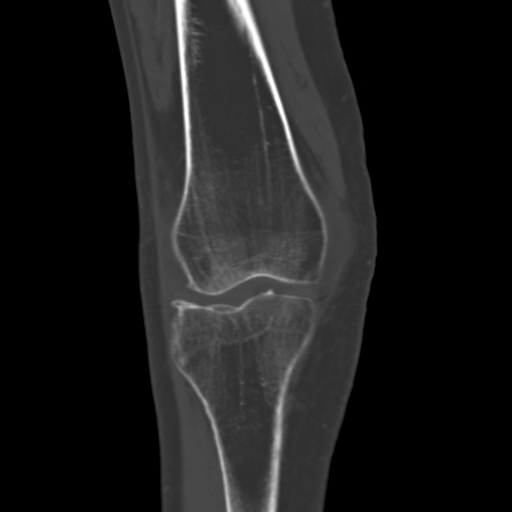
[im 35/58  bone]
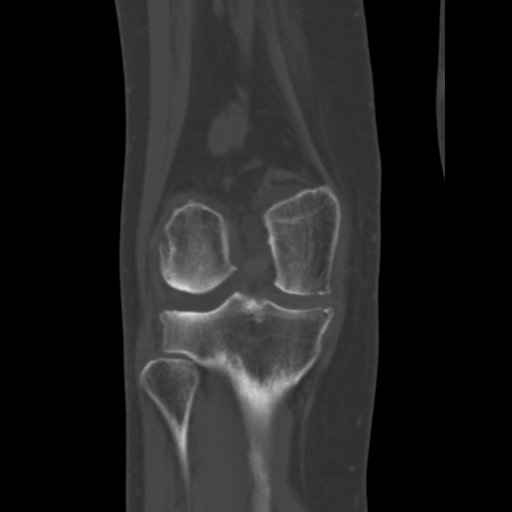

[Series 605: sagittal soft tissue · sagittal · 0.41mm/px · 5 of 55 slices shown, 6 images]
[im 19/55  bone]
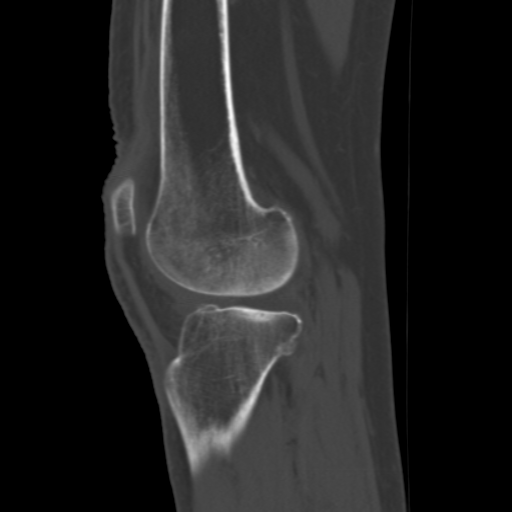
[im 23/55  bone]
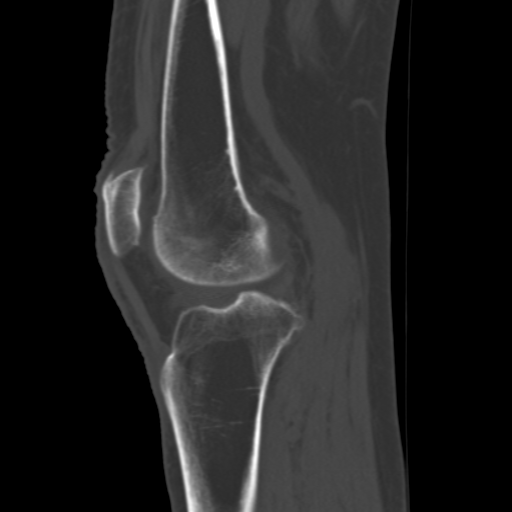
[im 28/55  soft-tissue]
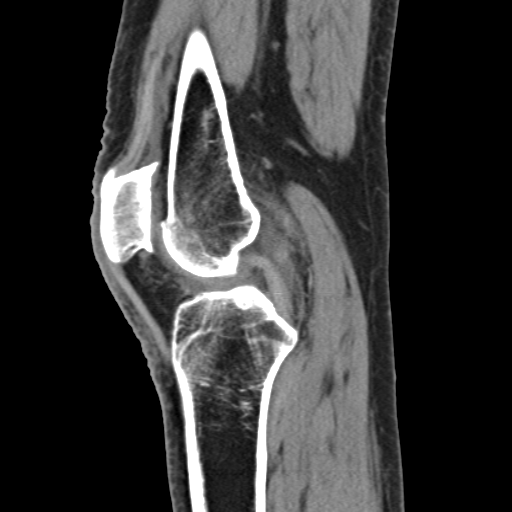
[im 28/55  bone]
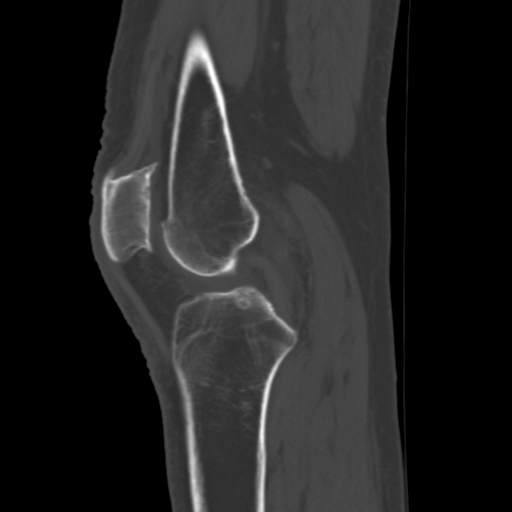
[im 32/55  bone]
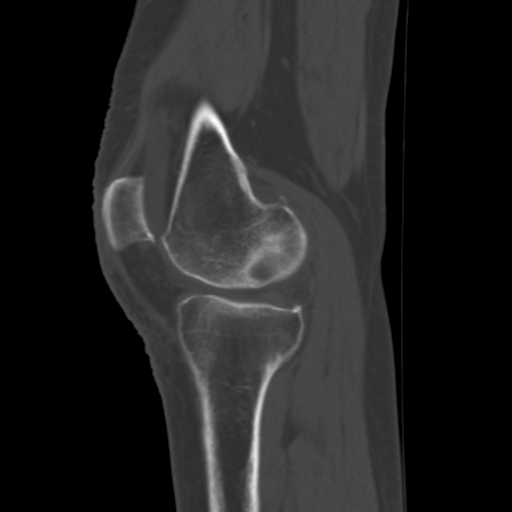
[im 37/55  bone]
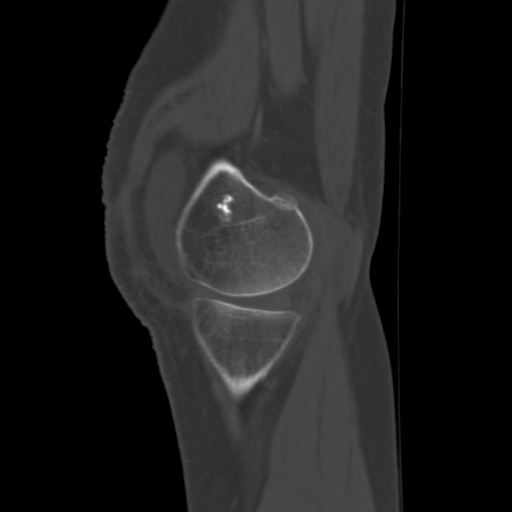

[14 of 33 positions shown; findings below may reference images not displayed]

FINDINGS: The hip demonstrates no fracture or dislocation. There is no lytic
or blastic lesion.

The right knee demonstrates no fracture or dislocation. There is no
lytic or blastic lesion. There are tricompartmental osteoarthritic
changes of the right knee. There is a benign chondroid lesion
involving the medial femoral condyle likely representing an there is
a small joint effusion. Enchondroma.

The ankle demonstrates no fracture or dislocation. There is no lytic
or blastic lesion.
IMPRESSION: Tricompartmental osteoarthritis of the right knee.

## 2014-07-06 ENCOUNTER — Other Ambulatory Visit: Payer: Self-pay

## 2014-07-06 MED ORDER — NEBIVOLOL HCL 10 MG PO TABS: 10.0000 mg | ORAL_TABLET | Freq: Every day | ORAL | Status: DC

## 2014-07-20 IMAGING — CR DG CHEST 1V PORT
1 series · 1 of 1 positions shown · non-contrast
Comparison: PA and lateral chest of July 25, 2012

CLINICAL DATA: Weakness and shortness of breath with altered mental
status

EXAM:
PORTABLE CHEST - 1 VIEW

[ap]
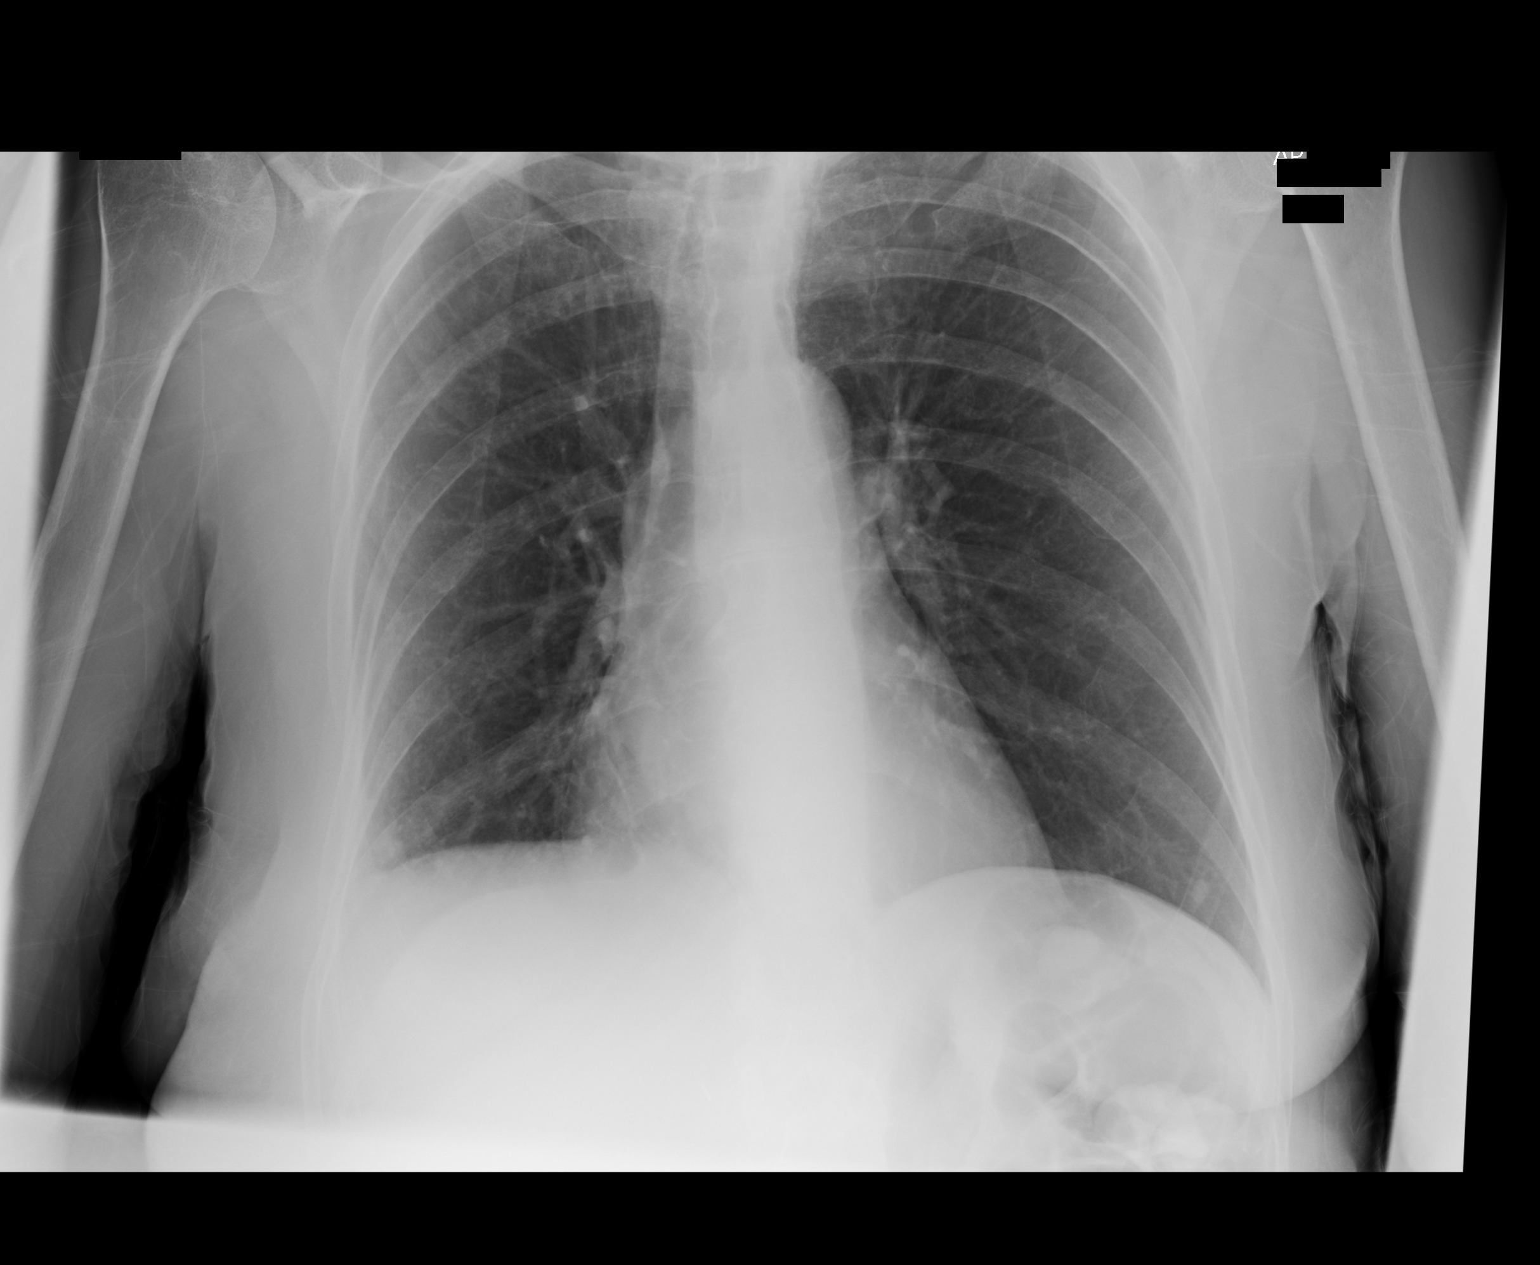

[1 of 1 positions shown; findings below may reference images not displayed]

FINDINGS: The lungs are well-expanded. There is no focal infiltrate. There is
subtle stable nodularity just above the lateral aspect of the left
hemidiaphragm. Minimal increased density partially obscures the
right lateral costophrenic gutter. The heart and mediastinal
structures are normal. The bony thorax is unremarkable.
IMPRESSION: COPD. Minimal right basilar atelectasis is suspected. There is no
CHF.

## 2014-09-20 ENCOUNTER — Other Ambulatory Visit: Payer: Self-pay | Admitting: Internal Medicine

## 2014-09-20 DIAGNOSIS — Z1231 Encounter for screening mammogram for malignant neoplasm of breast: Secondary | ICD-10-CM

## 2014-10-04 IMAGING — MG MM DIGITAL SCREENING BILAT W/ CAD
5 series · 5 of 5 positions shown · non-contrast
Comparison: Previous exam(s).

CLINICAL DATA: Screening.

EXAM:
DIGITAL SCREENING BILATERAL MAMMOGRAM WITH CAD

[R MLO]
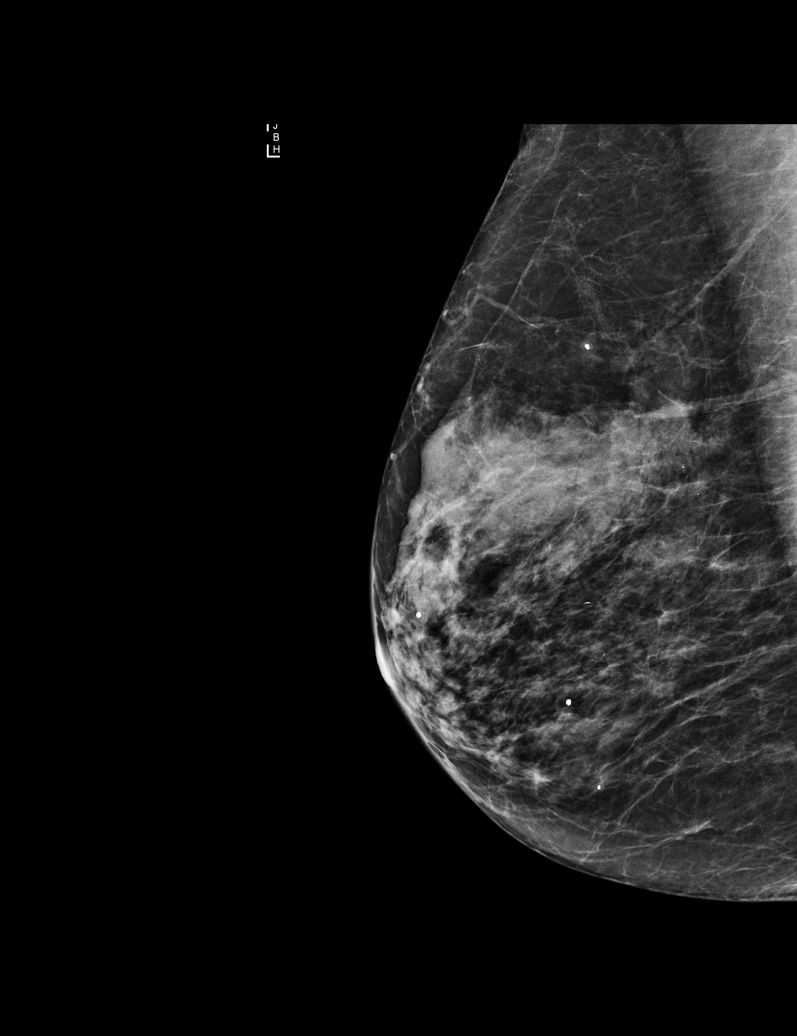

[R CC (1 of 2)]
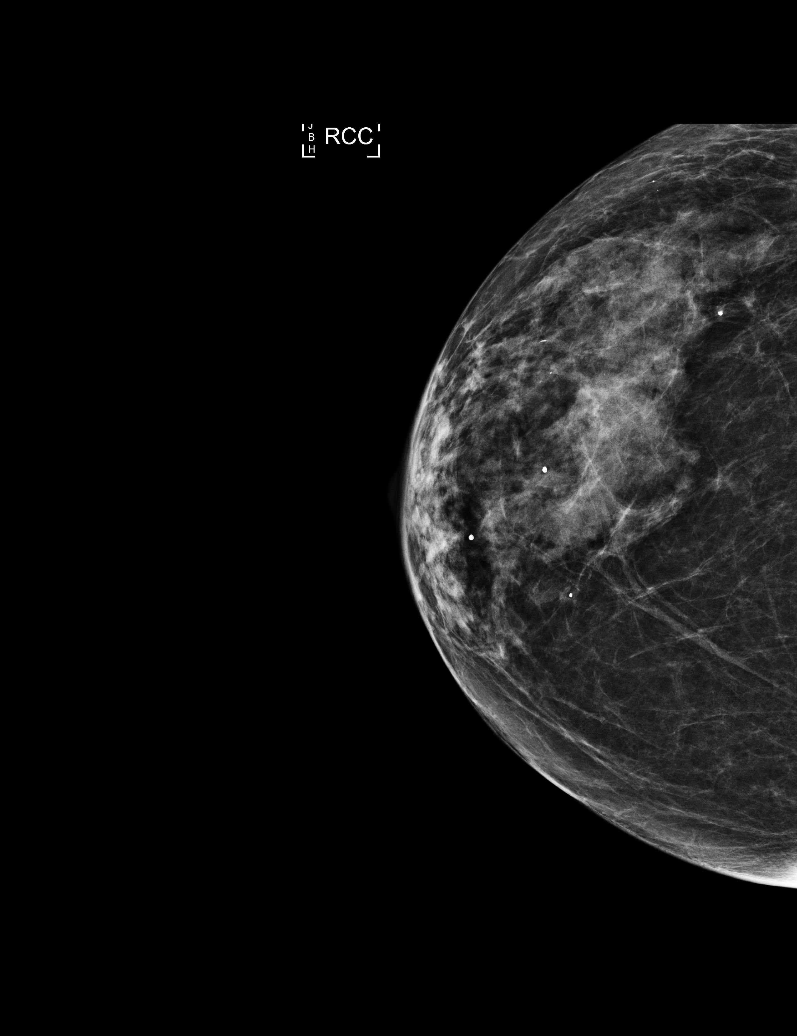

[L MLO]
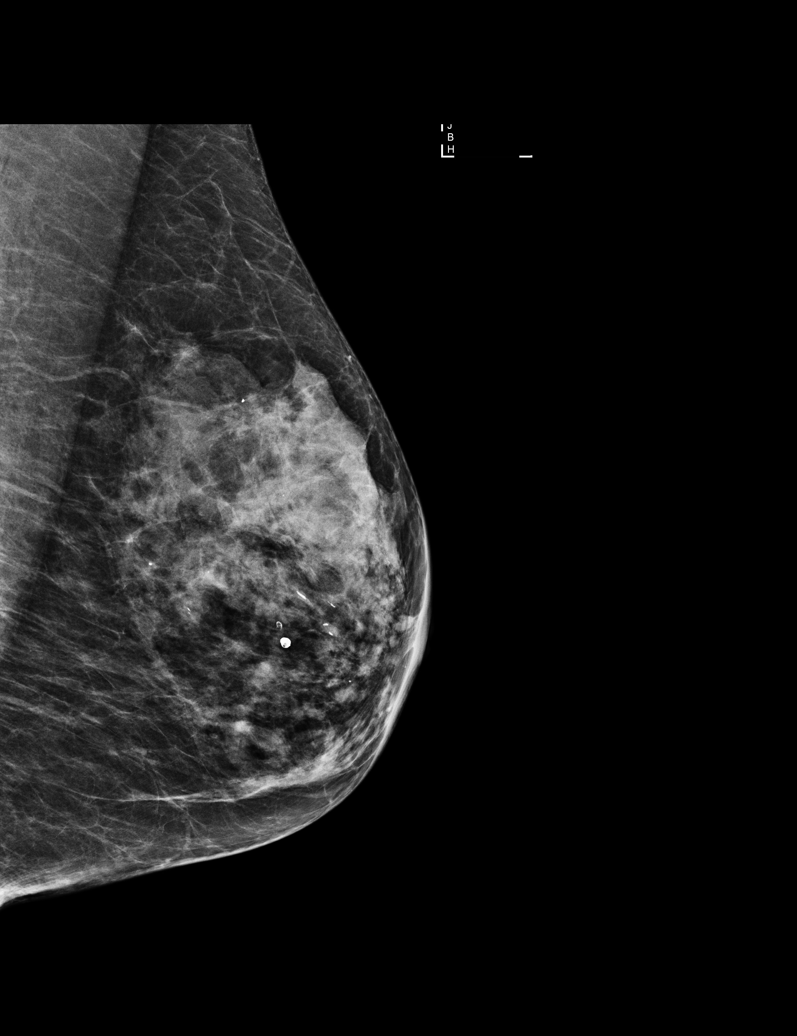

[R CC (2 of 2)]
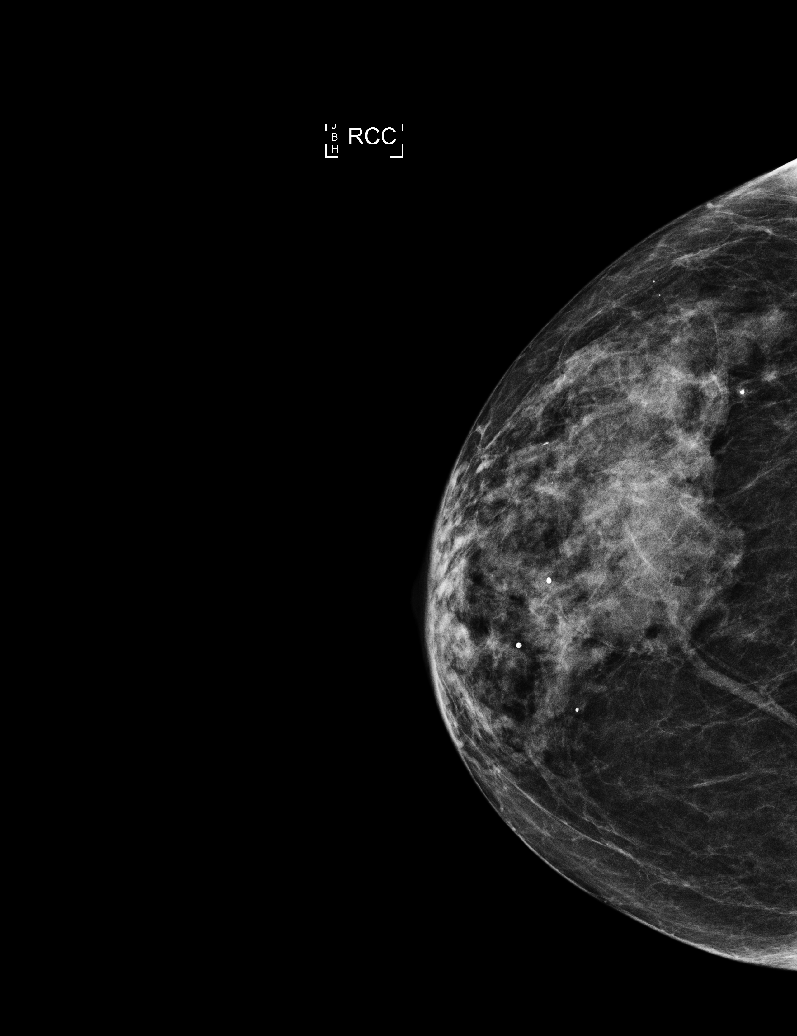

[L CC]
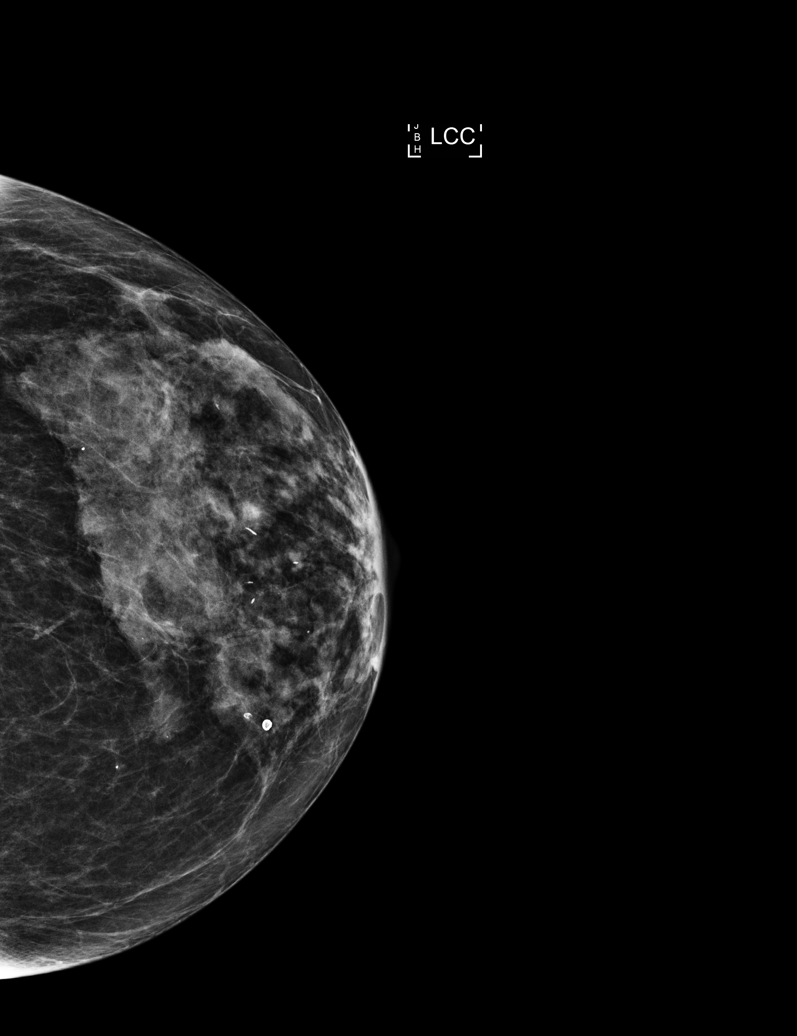

[5 of 5 positions shown; findings below may reference images not displayed]

ACR Breast Density Category c: The breast tissue is heterogeneously
dense, which may obscure small masses.
FINDINGS: There are no findings suspicious for malignancy. Images were
processed with CAD.
IMPRESSION: No mammographic evidence of malignancy. A result letter of this
screening mammogram will be mailed directly to the patient.

RECOMMENDATION:
Screening mammogram in one year. (Code:YJ-[id number])

BI-RADS CATEGORY  1: Negative.

## 2014-10-09 ENCOUNTER — Other Ambulatory Visit: Payer: Self-pay | Admitting: Internal Medicine

## 2014-10-09 ENCOUNTER — Ambulatory Visit
Admission: RE | Admit: 2014-10-09 | Discharge: 2014-10-09 | Disposition: A | Discharge status: Home / Self Care | Payer: Medicare Other | Source: Ambulatory Visit | Attending: Internal Medicine | Admitting: Internal Medicine

## 2014-10-09 DIAGNOSIS — Z1231 Encounter for screening mammogram for malignant neoplasm of breast: Secondary | ICD-10-CM | POA: Diagnosis present

## 2014-10-31 ENCOUNTER — Ambulatory Visit (INDEPENDENT_AMBULATORY_CARE_PROVIDER_SITE_OTHER): Payer: Medicare Other | Admitting: Pulmonary Disease

## 2014-10-31 ENCOUNTER — Encounter: Payer: Self-pay | Admitting: Pulmonary Disease

## 2014-10-31 VITALS — BP 122/60 | HR 79 | Wt 134.0 lb

## 2014-10-31 DIAGNOSIS — J432 Centrilobular emphysema: Secondary | ICD-10-CM | POA: Diagnosis not present

## 2014-10-31 DIAGNOSIS — Z23 Encounter for immunization: Secondary | ICD-10-CM | POA: Diagnosis not present

## 2014-10-31 NOTE — Assessment & Plan Note (Signed)
This has been a stable interval for Kerri Carter. She has not had an exacerbation of her COPD and over one year and she no longer smokes cigarettes. She has only mild airflow obstruction and minimal symptoms so she does not use a controller medication.  She has recently been diagnosed with rheumatoid arthritis and is currently taking injectable methotrexate. This has helped her arthritis symptoms significantly.  From my standpoint considering her mild COPD, I see no reason to stop methotrexate anytime soon considering its efficacy. However, we need to monitor her lung function testing closely.  Plan: Repeat lung function testing in [place] and then again in 6 months Flu shot today F/u 6 months

## 2014-10-31 NOTE — Progress Notes (Signed)
Subjective:    Patient ID: Kerri Carter, female    DOB: 1940/06/24, 74 y.o.   MRN: [id number]  Synopsis: [name] established care for COPD at the [facility] office in 08/2011.  Had PFTs in 04/2011 in March 2013 which showed: Ratio 70%, clear obstruction on flow volume loop, FEV1 1.79 L (81% predicted.), Total lung capacity 5.3 L (115% predicted), residual volume 2.49 L (136% predicted), DLCO 84% predicted. She smoked 0.5packs per day for 20 years.    HPI  Chief Complaint  Patient presents with  . Follow-up    Pt states that breathing unchanged since last visit. No new complaints voiced     Kerri was recently diagnosed with rheumatoid arthritis recently due to lots of shoulder and knee and hand pain. She started methotrexate shots which have helped a lot.  She is taking the shots at home. Her breathing has been fine during this time. She last had a cold in the winter when she had a severe cough when she took whiskey for it with lemon and honey.  She is using the symbicort. She is cold all the time.     Past Medical History  Diagnosis Date  . Hypothyroidism   . HTN (hypertension)   . CRI (chronic renal insufficiency)   . Hyperlipidemia   . Diabetes mellitus   . Asthma   . Angioedema   . Anaphylaxis   . Osteoarthritis     hands, low titer rheumatoid factor, carpal tunnel  . Carpal tunnel syndrome      Review of Systems  Constitutional: Negative for chills, activity change and fatigue.  Respiratory: Positive for cough, shortness of breath and wheezing. Negative for stridor.   Cardiovascular: Negative for chest pain, palpitations and leg swelling.       Objective:   Physical Exam  Filed Vitals:   10/31/14 1534  BP: 122/60  Pulse: 79  Weight: 134 lb (60.782 kg)  SpO2: 96%  RA  Gen: mildly ill appearing, no acute distress HEENT: NCAT, EOMi, OP clear,  PULM: Slight exp wheezing CV: RRR, no mgr, no JVD AB: BS+, soft, nontender, no hsm Ext: warm, no edema, no  clubbing, no cyanosis      Assessment & Plan:   COPD (chronic obstructive pulmonary disease) This has been a stable interval for Kerri Carter. She has not had an exacerbation of her COPD and over one year and she no longer smokes cigarettes. She has only mild airflow obstruction and minimal symptoms so she does not use a controller medication.  She has recently been diagnosed with rheumatoid arthritis and is currently taking injectable methotrexate. This has helped her arthritis symptoms significantly.  From my standpoint considering her mild COPD, I see no reason to stop methotrexate anytime soon considering its efficacy. However, we need to monitor her lung function testing closely.  Plan: Repeat lung function testing in [place] and then again in 6 months Flu shot today F/u 6 months    Updated Medication List Outpatient Encounter Prescriptions as of 10/31/2014  Medication Sig  . albuterol (VENTOLIN HFA) 108 (90 BASE) MCG/ACT inhaler Inhale 2 puffs into the lungs every 6 (six) hours as needed.  Marland Kitchen aspirin 81 MG tablet Take 81 mg by mouth daily.  . budesonide-formoterol (SYMBICORT) 160-4.5 MCG/ACT inhaler Inhale 2 puffs into the lungs 2 (two) times daily. (Patient taking differently: Inhale 2 puffs into the lungs 2 (two) times daily as needed. )  . folic acid (FOLVITE) 1 MG tablet Take 1 mg  by mouth daily.  . furosemide (LASIX) 20 MG tablet Take by mouth.  . gabapentin (NEURONTIN) 100 MG capsule Take 3 capsules by mouth 3 (three) times daily.   Marland Kitchen HYDROcodone-acetaminophen (VICODIN) 5-500 MG per tablet Take 1 tablet by mouth every 4 (four) hours as needed (cough).  . lovastatin (ALTOPREV) 20 MG 24 hr tablet Take 20 mg by mouth at bedtime.  . methotrexate 50 MG/2ML injection Inject into the skin.  . montelukast (SINGULAIR) 10 MG tablet Take 10 mg by mouth daily.   . nebivolol (BYSTOLIC) 10 MG tablet Take 1 tablet (10 mg total) by mouth daily.  . pantoprazole (PROTONIX) 40 MG tablet Take 40  mg by mouth 2 (two) times daily.   . potassium chloride (K-DUR,KLOR-CON) 10 MEQ tablet Take by mouth.  . ranitidine (ZANTAC) 150 MG tablet Take 150 mg by mouth 2 (two) times daily.  Marland Kitchen saccharomyces boulardii (FLORASTOR) 250 MG capsule Take 250 mg by mouth as needed.   . traZODone (DESYREL) 50 MG tablet take 1 to 3 tablets by mouth at bedtime  . benzonatate (TESSALON) 200 MG capsule Take 1 capsule (200 mg total) by mouth 3 (three) times daily as needed for cough. (Patient not taking: Reported on 10/31/2014)  . predniSONE (DELTASONE) 5 MG tablet Take 5 mg by mouth daily with breakfast.   No facility-administered encounter medications on file as of 10/31/2014.

## 2014-10-31 NOTE — Patient Instructions (Signed)
We will order a lung function test in [place] and then repeat it 6 months later I will see you back in 6 months Keep taking your medications as prescribed by your rheumatologist

## 2014-11-05 ENCOUNTER — Ambulatory Visit (INDEPENDENT_AMBULATORY_CARE_PROVIDER_SITE_OTHER): Payer: Medicare Other | Admitting: Pulmonary Disease

## 2014-11-05 ENCOUNTER — Telehealth: Payer: Self-pay | Admitting: *Deleted

## 2014-11-05 DIAGNOSIS — R0600 Dyspnea, unspecified: Secondary | ICD-10-CM

## 2014-11-05 DIAGNOSIS — J432 Centrilobular emphysema: Secondary | ICD-10-CM | POA: Diagnosis not present

## 2014-11-05 LAB — PULMONARY FUNCTION TEST
DL/VA % pred: 72 %
DL/VA: 3.48 ml/min/mmHg/L
DLCO unc % pred: 66 %
DLCO unc: 16.1 ml/min/mmHg
FEF 25-75 Post: 2.12 L/sec
FEF 25-75 Pre: 1.12 L/sec
FEF2575-%Change-Post: 88 %
FEF2575-%Pred-Post: 117 %
FEF2575-%Pred-Pre: 62 %
FEV1-%Change-Post: 16 %
FEV1-%Pred-Post: 96 %
FEV1-%Pred-Pre: 82 %
FEV1-Post: 2.12 L
FEV1-Pre: 1.82 L
FEV1FVC-%Change-Post: 15 %
FEV1FVC-%Pred-Pre: 92 %
FEV6-%Change-Post: 1 %
FEV6-%Pred-Post: 94 %
FEV6-%Pred-Pre: 92 %
FEV6-Post: 2.63 L
FEV6-Pre: 2.59 L
FEV6FVC-%Change-Post: 0 %
FEV6FVC-%Pred-Post: 104 %
FEV6FVC-%Pred-Pre: 103 %
FVC-%Change-Post: 0 %
FVC-%Pred-Post: 90 %
FVC-%Pred-Pre: 89 %
FVC-Post: 2.63 L
FVC-Pre: 2.62 L
Post FEV1/FVC ratio: 80 %
Post FEV6/FVC ratio: 100 %
Pre FEV1/FVC ratio: 70 %
Pre FEV6/FVC Ratio: 100 %

## 2014-11-05 NOTE — Telephone Encounter (Signed)
Pt ask me to send a message to let you know that her PFT was performed today since she doesn't have to f/u for 6 months. In case there needs to be to any changes. Thanks

## 2014-11-05 NOTE — Progress Notes (Signed)
PFT performed today with Nitrogen washout. 

## 2014-11-13 NOTE — Telephone Encounter (Signed)
Let her know that it looks OK to me.  I want her to have a PFT every 6 months while she is on methotrexate

## 2014-11-14 NOTE — Telephone Encounter (Signed)
Spoke with pt, aware of results and recs.  Nothing further needed.  

## 2014-11-23 IMAGING — CR DG CHEST 1V PORT
1 series · 1 of 1 positions shown · non-contrast
Comparison: Chest x-ray 07/21/2013.

CLINICAL DATA: 73-year-old female with midsternal chest pain,
nausea, dizziness and history of asthma. Onset of chest pain and
nausea and dizziness this morning.

EXAM:
PORTABLE CHEST - 1 VIEW

[ap]
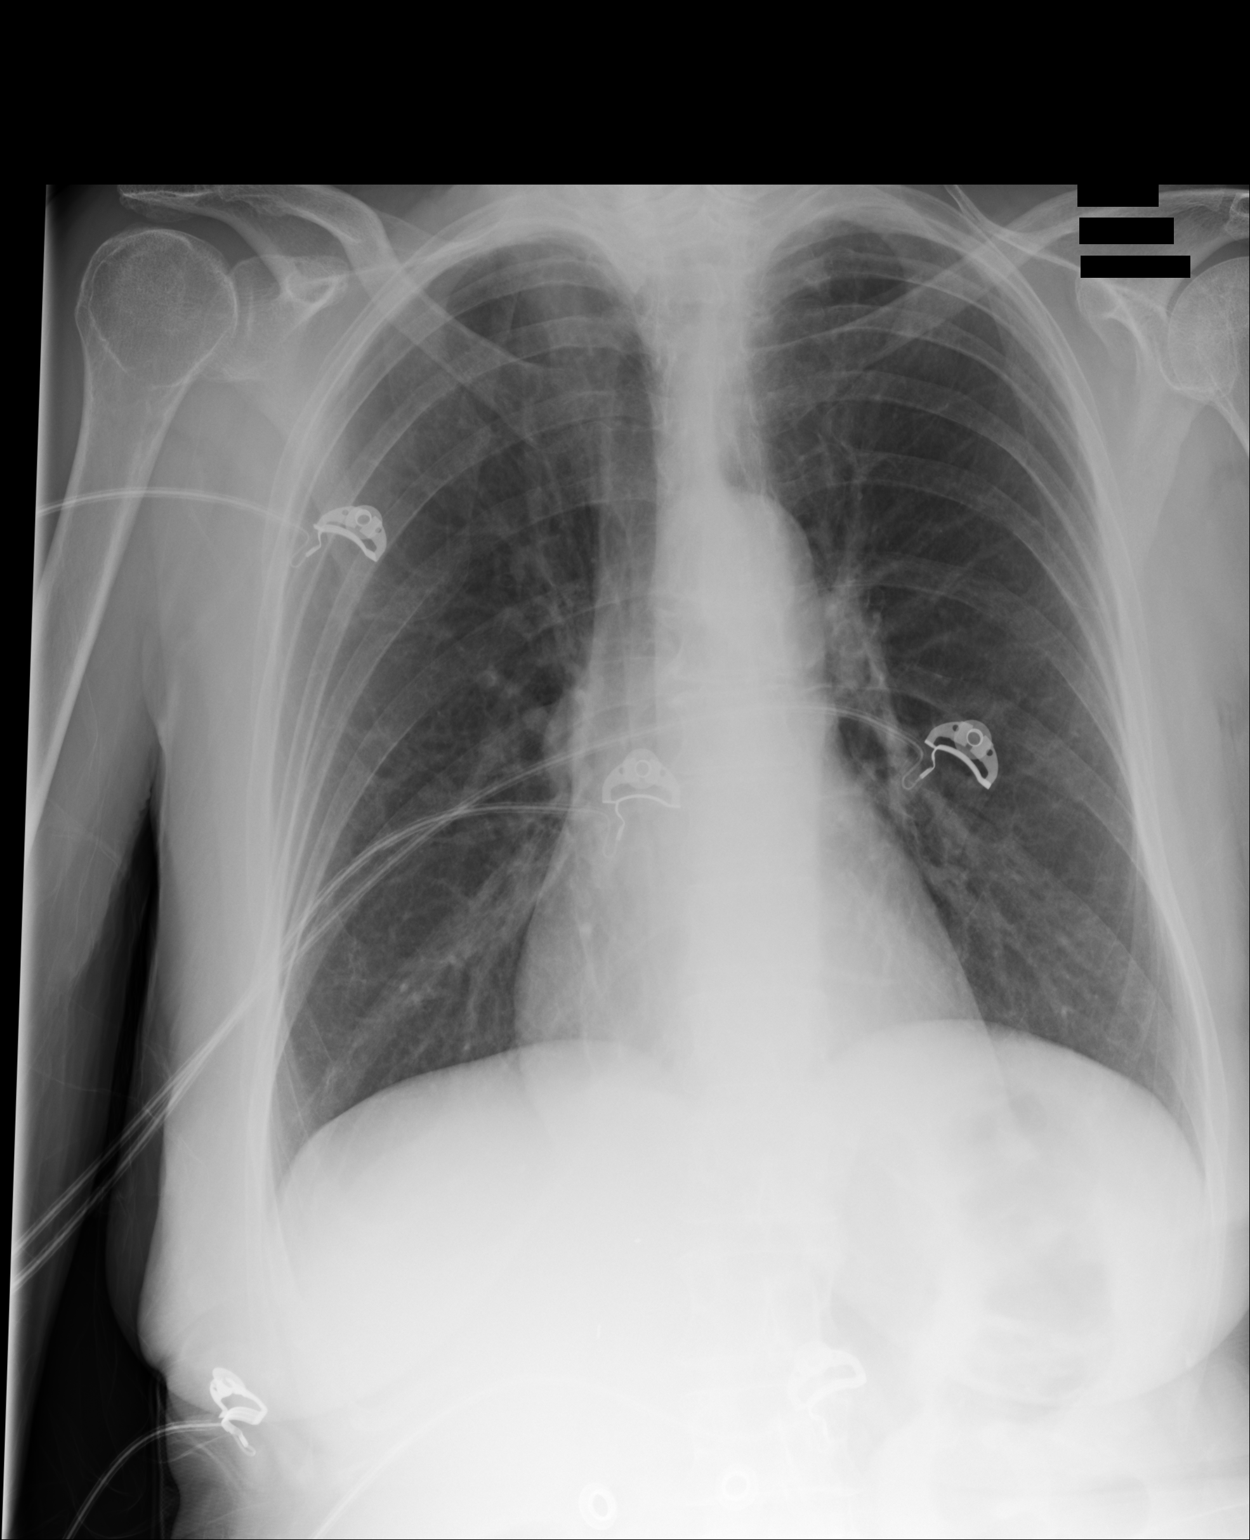

[1 of 1 positions shown; findings below may reference images not displayed]

FINDINGS: Lung volumes are normal. No consolidative airspace disease. No
pleural effusions. No evidence of pulmonary edema. Heart size is
mildly enlarged. The patient is rotated to the right on today's
exam, resulting in distortion of the mediastinal contours and
reduced diagnostic sensitivity and specificity for mediastinal
pathology. Atherosclerosis in the thoracic aorta.
IMPRESSION: 1. No radiographic evidence of acute cardiopulmonary disease.
2. Mild cardiomegaly.
3. Atherosclerosis.

## 2014-11-26 ENCOUNTER — Telehealth: Payer: Self-pay | Admitting: Pulmonary Disease

## 2014-11-26 NOTE — Telephone Encounter (Signed)
Per PFT result note: Please let the patient know this was OK  I spoke with patient about results and she verbalized understanding and had no questions

## 2014-11-26 NOTE — Progress Notes (Signed)
Quick Note:  lmtcb for pt. ______ 

## 2014-11-28 ENCOUNTER — Telehealth: Payer: Self-pay | Admitting: *Deleted

## 2014-11-28 DIAGNOSIS — M069 Rheumatoid arthritis, unspecified: Secondary | ICD-10-CM

## 2014-11-28 DIAGNOSIS — R0600 Dyspnea, unspecified: Secondary | ICD-10-CM

## 2014-11-28 DIAGNOSIS — J984 Other disorders of lung: Secondary | ICD-10-CM

## 2014-11-28 NOTE — Telephone Encounter (Signed)
-----   Message from Juanito Doom, MD sent at 11/28/2014  5:50 AM EDT ----- A, I took a more careful look at her PFTs.  There are some subtle changes that could be related to her rheumatoid arthritis or methotrexate.  I want her to have a high resolution CT chest and come back to see me to discuss the results. Dr. Arsenio Loader to read.  Reason: recent diagnosis of RA on methotrexate, has dyspnea, PFT suggestive of small airways disease. Thanks [employer]

## 2014-11-28 NOTE — Telephone Encounter (Signed)
Called pt and line rings busy x 3 wcb

## 2014-11-30 NOTE — Telephone Encounter (Signed)
Called pt and is aware of below. CT chest HR has been ordered. Please advise [facility] when scheduled so we can call pt to schedule OV thanks

## 2014-11-30 NOTE — Telephone Encounter (Signed)
CT has been scheduled for 11/1 at 4:00. Pt aware of CT appt.

## 2014-12-04 ENCOUNTER — Ambulatory Visit (INDEPENDENT_AMBULATORY_CARE_PROVIDER_SITE_OTHER)
Admission: RE | Admit: 2014-12-04 | Discharge: 2014-12-04 | Disposition: A | Discharge status: Home / Self Care | Payer: Medicare Other | Source: Ambulatory Visit | Attending: Pulmonary Disease | Admitting: Pulmonary Disease

## 2014-12-04 DIAGNOSIS — M069 Rheumatoid arthritis, unspecified: Secondary | ICD-10-CM

## 2014-12-04 DIAGNOSIS — J432 Centrilobular emphysema: Secondary | ICD-10-CM

## 2014-12-04 DIAGNOSIS — J984 Other disorders of lung: Secondary | ICD-10-CM

## 2014-12-04 DIAGNOSIS — R911 Solitary pulmonary nodule: Secondary | ICD-10-CM

## 2014-12-05 ENCOUNTER — Telehealth: Payer: Self-pay | Admitting: Pulmonary Disease

## 2014-12-05 DIAGNOSIS — R911 Solitary pulmonary nodule: Secondary | ICD-10-CM | POA: Insufficient documentation

## 2014-12-05 DIAGNOSIS — M069 Rheumatoid arthritis, unspecified: Secondary | ICD-10-CM | POA: Insufficient documentation

## 2014-12-05 NOTE — Progress Notes (Signed)
Quick Note:  LMTCB on home and cell ______ 

## 2014-12-05 NOTE — Telephone Encounter (Signed)
Notes Recorded by Juanito Doom, MD on 12/05/2014 at 6:22 AM A, Please let her know that I was happy with the way her CT looked, nothing worrisome. She had a small nodule that the radiologist said was benign. Thanks B     Pt returned call. I explained to her BQ's results and recs. PT voiced understanding and had no further questions. Nothing further needed, will sign off on message.

## 2014-12-06 ENCOUNTER — Telehealth: Payer: Self-pay | Admitting: Pulmonary Disease

## 2014-12-06 NOTE — Telephone Encounter (Signed)
lmtcb

## 2014-12-06 NOTE — Telephone Encounter (Signed)
[phone] pt calling back

## 2014-12-06 NOTE — Progress Notes (Signed)
Quick Note:  ATC pt, LMTCB for pt ______

## 2014-12-06 NOTE — Telephone Encounter (Signed)
Called spoke with pt. Made her aware we have already spoken with her regarding CT results and it looks the nurse was calling again to relay the same message. nothing further needed

## 2014-12-13 ENCOUNTER — Encounter: Payer: Self-pay | Admitting: Podiatry

## 2014-12-13 ENCOUNTER — Ambulatory Visit (INDEPENDENT_AMBULATORY_CARE_PROVIDER_SITE_OTHER): Payer: Medicare Other

## 2014-12-13 ENCOUNTER — Ambulatory Visit (INDEPENDENT_AMBULATORY_CARE_PROVIDER_SITE_OTHER): Payer: Medicare Other | Admitting: Podiatry

## 2014-12-13 DIAGNOSIS — M19071 Primary osteoarthritis, right ankle and foot: Secondary | ICD-10-CM | POA: Diagnosis not present

## 2014-12-13 DIAGNOSIS — R52 Pain, unspecified: Secondary | ICD-10-CM

## 2014-12-13 DIAGNOSIS — M779 Enthesopathy, unspecified: Secondary | ICD-10-CM | POA: Diagnosis not present

## 2014-12-13 NOTE — Progress Notes (Signed)
   Subjective:    Patient ID: Kerri Carter, female    DOB: [birth year], 74 y.o.   MRN: [id number]  HPI   74 year old female presents the office with concerns her right ankle pain which has been ongoing for the last 3 months. She states that she has pain to her ankle mostly when walking or stating for long periods of time. She's tried shoe gear changes was does not seem to help. She denies any injury or trauma to the area. She states that she's had swelling overlying the outside portion of the ankle up she denies any redness or increase in warmth. She's had no other treatment. No other complaints at this time.  Review of Systems  All other systems reviewed and are negative.      Objective:   Physical Exam General: AAO x3, NAD  Dermatological: Skin is warm, dry and supple bilateral. Nails x 10 are well manicured; remaining integument appears unremarkable at this time. There are no open sores, no preulcerative lesions, no rash or signs of infection present.  Vascular: Dorsalis Pedis artery and Posterior Tibial artery pedal pulses are 2/4 bilateral with immedate capillary fill time. Pedal hair growth present. No varicosities and no lower extremity edema present bilateral. There is no pain with calf compression, swelling, warmth, erythema.   Neruologic: Grossly intact via light touch bilateral. Vibratory intact via tuning fork bilateral. Protective threshold with Semmes Wienstein monofilament intact to all pedal sites bilateral. Patellar and Achilles deep tendon reflexes 2+ bilateral. No Babinski or clonus noted bilateral.   Musculoskeletal: No gross boney pedal deformities bilateral. There is tenderness palpation overlying the anterolateral aspect of the anterior medial aspect of the right ankle. There is no pain with ankle joint range of motion is no crepitation or restriction. There is some mild discomfort along the course the ATFL however there is no symmetric and pain along the course of the  PTFL or the CFL. There is no pain on the course of the peroneal tendons. No pain with subtalar joint range of motion help there is mild discomfort with lateral palpation of the sinus tarsi. There is no pain of the foot. There is no overlying erythema or increase in warmth bilaterally. No other areas of edema. No pain of the contralateral extremity. No pain, crepitus, or limitation noted with foot and ankle range of motion bilateral. Muscular strength 5/5 in all groups tested bilateral.  Gait: Unassisted, Nonantalgic.      Assessment & Plan:  74 year old female with right ankle pain without history of trauma with continued swelling and pain. -X-rays were obtained and reviewed with the patient. There is no definitive evidence of acute fracture or stress fracture. -Treatment options discussed including all alternatives, risks, and complications -Etiology of symptoms were discussed. Discussed this could be contributing from rheumatoid arthritis or possibly gout. She declines any blood work. -I discussed a steroid injection into the ankle joint for both diagnostic and therapeutic purposes. She elects to proceed after discussing risks, complications. Under sterile conditions a total of 1.5 mL of dexamethasone phosphate and 2% lidocaine plain was infiltrated into the right ankle along the anterior medial ankle gutter. She tolerated the injection well without any complications. Post injection care was discussed. -Dispensed ankle brace. -Follow-up as scheduled  or sooner if any problems arise. In the meantime, encouraged to call the office with any questions, concerns, change in symptoms.   Celesta Gentile, DPM

## 2015-01-03 ENCOUNTER — Ambulatory Visit (INDEPENDENT_AMBULATORY_CARE_PROVIDER_SITE_OTHER): Payer: Medicare Other

## 2015-01-03 ENCOUNTER — Ambulatory Visit (INDEPENDENT_AMBULATORY_CARE_PROVIDER_SITE_OTHER): Payer: Medicare Other | Admitting: Podiatry

## 2015-01-03 DIAGNOSIS — B351 Tinea unguium: Secondary | ICD-10-CM | POA: Diagnosis not present

## 2015-01-03 DIAGNOSIS — R52 Pain, unspecified: Secondary | ICD-10-CM | POA: Diagnosis not present

## 2015-01-03 DIAGNOSIS — M79676 Pain in unspecified toe(s): Secondary | ICD-10-CM | POA: Diagnosis not present

## 2015-01-03 DIAGNOSIS — T148 Other injury of unspecified body region: Secondary | ICD-10-CM

## 2015-01-03 DIAGNOSIS — T148XXA Other injury of unspecified body region, initial encounter: Secondary | ICD-10-CM

## 2015-01-03 DIAGNOSIS — M19071 Primary osteoarthritis, right ankle and foot: Secondary | ICD-10-CM

## 2015-01-03 DIAGNOSIS — M779 Enthesopathy, unspecified: Secondary | ICD-10-CM

## 2015-01-03 NOTE — Progress Notes (Signed)
Patient ID: Kerri Carter, female   DOB: Apr 24, 1940, 74 y.o.   MRN: [id number]  Subjective: 74 year old female presents the office they for concerns of continued right ankle pain. She states that when she wears the brace she does feel line and she does not have as much pain. She does that when she tries to go without the brace she continues to have pain to her right ankle. She states that she did not have any relief after the injection. She denies any recent injury or trauma. She denies any swelling or redness. She still can these have pain on daily basis. She also states her nails are thick, painful, elongated and she is unable to trim them herself. Denies any swelling redness or drainage. No other complaints.  Objective: AAO 3, NAD DP/PT pulses 2/4, CRT less than 3 seconds Protective sensation mildly decreased with Simms Weinstein monofilament There is continued tenderness along the lateral aspect of the right ankle. At today's appointment as the pain appears to be localized along the course of the ATFL out of courses CFL or the PTFL. There is tenderness on patient along the course of the peroneal tendons posterior to the lateral malleolus as well as inferior. There is no pain on the insertion and the fifth metatarsal. There is no pain with inversion/eversion. Ankle joint range of motion is intact. No other areas of tenderness to bilateral lower extremities. No other lesions or pre-ulcerative lesions. The nails appear to be hypertrophic, dystrophic, brittle, discolored, elongated 10. No surrounding erythema or drainage. There is noted with calf compression, swelling, warmth, erythema.  Assessment: 74 year old female with continuation of right ankle pain, likely chronic ATFL tear and peroneal tendinitis versus tear  Plan: -Treatment options discussed including all alternatives, risks, and complications -At this time as she is continuing to have symptoms this been ongoing for quite some time I  recommended MRI to evaluate the integrity of the lateral ankle ligaments as well as the peroneal tendon. This is ordered today. She does need an open MRI. -Continued ankle brace for now. -Nail sharply debrided 10 without complication/bleeding. -Follow-up after MRI or sooner if any problems are to arise. Call questions or concerns in the meantime.  Celesta Gentile, DPM

## 2015-01-04 ENCOUNTER — Telehealth: Payer: Self-pay | Admitting: *Deleted

## 2015-01-04 DIAGNOSIS — T148XXA Other injury of unspecified body region, initial encounter: Secondary | ICD-10-CM

## 2015-01-04 DIAGNOSIS — T07XXXA Unspecified multiple injuries, initial encounter: Secondary | ICD-10-CM

## 2015-01-04 DIAGNOSIS — M779 Enthesopathy, unspecified: Secondary | ICD-10-CM

## 2015-01-04 NOTE — Telephone Encounter (Signed)
Pt called states no one called her about her MRI appt.

## 2015-01-04 NOTE — Telephone Encounter (Addendum)
-----   Message from Trula Slade, DPM sent at 01/04/2015  3:27 PM EST ----- Right ankle MRI without contrast to rule out tear  She needs an Open MRI at [facility]  She does have a hip and knee replacement. Orders for MRI faxed to [facility] with pt data. PRIOR AUTHORIZATION FOR MRI RIGHT ANKLE [id number], DX T14.8 - [id number] VALID 45 DAYS.  FAXED TO [place].

## 2015-01-11 ENCOUNTER — Ambulatory Visit (HOSPITAL_COMMUNITY): Admission: RE | Admit: 2015-01-11 | Payer: Medicare Other | Source: Ambulatory Visit

## 2015-01-14 ENCOUNTER — Telehealth: Payer: Self-pay | Admitting: *Deleted

## 2015-01-14 DIAGNOSIS — M779 Enthesopathy, unspecified: Secondary | ICD-10-CM

## 2015-01-14 DIAGNOSIS — T148XXA Other injury of unspecified body region, initial encounter: Secondary | ICD-10-CM

## 2015-01-14 NOTE — Telephone Encounter (Addendum)
Pt states she missed the MRI appt she and her husband couldn't find [facility].  I called pt and said I'd reschedule for [facility].  Pt states foot is worse even in the brace, would like pain medication. Faxed to [place].  Dr. Jacqualyn Posey ordered Vicodin 5/325mg  #30 1 tablet every 6 hours prn foot pain.  Informed pt she would be able to pick rx up in the [place] office this afternoon.  Pt states has MRI appt 02/01/2015.  01/15/2015 - Pt states no one called her concerning her pain medication.  I called pt and she said her husband was going over to the office to pick up the medication.

## 2015-01-14 NOTE — Telephone Encounter (Signed)
OK to refill vicodin. Thanks.

## 2015-01-15 ENCOUNTER — Other Ambulatory Visit: Payer: Self-pay | Admitting: Sports Medicine

## 2015-01-15 DIAGNOSIS — M79673 Pain in unspecified foot: Secondary | ICD-10-CM

## 2015-01-15 MED ORDER — HYDROCODONE-ACETAMINOPHEN 5-325 MG PO TABS: 1.0000 | ORAL_TABLET | Freq: Four times a day (QID) | ORAL | Status: DC | PRN

## 2015-01-15 NOTE — Telephone Encounter (Signed)
Spoke to Tammy in [employer] office and she states that patient's husband came to pick up Rx for Hydrocodone today and Dr. Cannon Kettle refilled it, printed it and it was given to patient's husband.

## 2015-02-01 ENCOUNTER — Ambulatory Visit
Admission: RE | Admit: 2015-02-01 | Discharge: 2015-02-01 | Disposition: A | Discharge status: Home / Self Care | Payer: Medicare Other | Source: Ambulatory Visit | Attending: Podiatry | Admitting: Podiatry

## 2015-02-01 ENCOUNTER — Telehealth: Payer: Self-pay | Admitting: *Deleted

## 2015-02-01 ENCOUNTER — Ambulatory Visit: Payer: Medicare Other

## 2015-02-01 DIAGNOSIS — T148 Other injury of unspecified body region: Secondary | ICD-10-CM | POA: Diagnosis present

## 2015-02-01 DIAGNOSIS — X58XXXA Exposure to other specified factors, initial encounter: Secondary | ICD-10-CM | POA: Diagnosis not present

## 2015-02-01 DIAGNOSIS — M19071 Primary osteoarthritis, right ankle and foot: Secondary | ICD-10-CM | POA: Insufficient documentation

## 2015-02-01 DIAGNOSIS — T148XXA Other injury of unspecified body region, initial encounter: Secondary | ICD-10-CM

## 2015-02-01 DIAGNOSIS — M779 Enthesopathy, unspecified: Secondary | ICD-10-CM | POA: Diagnosis not present

## 2015-02-01 NOTE — Telephone Encounter (Addendum)
Pt states she had her MRI this morning in [place], and wanted to know the results.  I told pt that we would call with results once Dr. Jacqualyn Posey had reviewed, probably the 1st of next week.  Dr. Jacqualyn Posey reviewed MRI 02/01/2015, and request pt make an appt to discuss results and treatment.  Informed pt and transferred to schedulers.

## 2015-02-07 ENCOUNTER — Encounter: Payer: Self-pay | Admitting: Podiatry

## 2015-02-07 ENCOUNTER — Ambulatory Visit (INDEPENDENT_AMBULATORY_CARE_PROVIDER_SITE_OTHER): Payer: Medicare Other | Admitting: Podiatry

## 2015-02-07 VITALS — BP 142/68 | HR 72 | Resp 18

## 2015-02-07 DIAGNOSIS — G5751 Tarsal tunnel syndrome, right lower limb: Secondary | ICD-10-CM | POA: Diagnosis not present

## 2015-02-07 DIAGNOSIS — M722 Plantar fascial fibromatosis: Secondary | ICD-10-CM

## 2015-02-07 DIAGNOSIS — R2681 Unsteadiness on feet: Secondary | ICD-10-CM | POA: Diagnosis not present

## 2015-02-07 DIAGNOSIS — M25571 Pain in right ankle and joints of right foot: Secondary | ICD-10-CM

## 2015-02-07 DIAGNOSIS — R262 Difficulty in walking, not elsewhere classified: Secondary | ICD-10-CM | POA: Diagnosis not present

## 2015-02-07 MED ORDER — METHYLPREDNISOLONE 4 MG PO TBPK: ORAL_TABLET | ORAL | Status: DC

## 2015-02-08 NOTE — Progress Notes (Addendum)
Patient ID: YELONDA ASSEFA, female   DOB: [birth year], 75 y.o.   MRN: [id number]  Subjective: 75 year old female presents the office today for continued pain in discussed MRI results of her right foot and ankle. She said that she can ease of pain to the outside part of her foot into the heels as well. She's been wearing an ankle brace is not helping she continues have pain even to walk. The cisterns affect her quality of life that she is not able to do her daily activities. She also feels that she feels unsteady on her feet. No other complaints at this time. Denies any systemic complaints such as fevers, chills, nausea, vomiting. No acute changes since last appointment, and no other complaints at this time.   Objective: AAO x3, NAD DP/PT pulses palpable bilaterally, CRT less than 3 seconds There is continued tenderness palpation along the lateral aspect of the right ankle today. There is tenderness directed over the sinus tarsi and along the course the ATFL. There is currently no tarsal the course the peroneal tendons this time. There is mild discomfort along the plantar medial tubercle of the calcaneus at the insertion of the plantar fascia. There is localized edema to the lateral aspect of the foot without any erythema or increase in warmth. There is mild pain with subtalar joint range of motion is no pain with ankle joint range of motion. There is no patella course the posterior tibial tendon. MMT 5/5, ROM WNL. No edema, erythema, increase in warmth to bilateral lower extremities.  No open lesions or pre-ulcerative lesions.  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  Plan: -All treatment options discussed with the patient including all alternatives, risks, complications.  -MRI findings were discussed the patient. See below. -Discussed steroid injection over she wishes to hold off. She states that she can take oral  steroids. I discussed the risks of this and she wishes to proceed. Prescribed Medrol Dosepak. -Also given her pain dispensed short cam boot. -Ice and elevation. -Follow-up in 2 weeks or sooner if any problems arise. In the meantime, encouraged to call the office with any questions, concerns, change in symptoms.   Celesta Gentile, DPM  IMPRESSION: 1. Constellation of findings as discussed above most likely due to sinus tarsi syndrome. Lateral hindfoot impingement syndrome is also possible but I do not see any significant hindfoot valgus or ruptured posterior tibialis tendon. An inflammatory arthropathy is also possible but unlikely. 2. Moderate tibiotalar joint degenerative changes and midfoot degenerative changes. 3. Intact medial and lateral ankle ligaments and tendons. Mild distal posterior tibialis tendinopathy but no tear/rupture.

## 2015-02-26 ENCOUNTER — Encounter: Payer: Self-pay | Admitting: Podiatry

## 2015-02-28 ENCOUNTER — Ambulatory Visit: Payer: Medicare Other | Admitting: Podiatry

## 2015-02-28 ENCOUNTER — Encounter: Payer: Self-pay | Admitting: Podiatry

## 2015-02-28 ENCOUNTER — Ambulatory Visit (INDEPENDENT_AMBULATORY_CARE_PROVIDER_SITE_OTHER): Payer: Medicare Other | Admitting: Podiatry

## 2015-02-28 DIAGNOSIS — M2142 Flat foot [pes planus] (acquired), left foot: Secondary | ICD-10-CM

## 2015-02-28 DIAGNOSIS — M2141 Flat foot [pes planus] (acquired), right foot: Secondary | ICD-10-CM

## 2015-02-28 DIAGNOSIS — M25571 Pain in right ankle and joints of right foot: Secondary | ICD-10-CM

## 2015-02-28 DIAGNOSIS — M2040 Other hammer toe(s) (acquired), unspecified foot: Secondary | ICD-10-CM

## 2015-02-28 DIAGNOSIS — G5751 Tarsal tunnel syndrome, right lower limb: Secondary | ICD-10-CM

## 2015-02-28 DIAGNOSIS — M2010 Hallux valgus (acquired), unspecified foot: Secondary | ICD-10-CM

## 2015-02-28 DIAGNOSIS — M19071 Primary osteoarthritis, right ankle and foot: Secondary | ICD-10-CM

## 2015-03-03 NOTE — Progress Notes (Signed)
Patient ID: Kerri Carter, female   DOB: [birth year], 75 y.o.   MRN: [id number]  Subjective: 75 year old female presents the office today for follow-up evaluation of pain to her right foot. She states that she is not liking the CAM boot and let to come out of it however she does feel that it has helped. She states that the boot is very heavy to wear. She doesn't the pain to her foot has decreased. No other complaints at this time. Denies any systemic complaints such as fevers, chills, nausea, vomiting. No acute changes since last appointment, and no other complaints at this time.   Objective: AAO x3, NAD DP/PT pulses palpable bilaterally, CRT less than 3 seconds There is decreased tenderness palpation along the lateral aspect of the right ankle. There is also decreased tenderness directed over the sinus tarsi and along the course the ATFL. There is currently no pain along the course the peroneal tendons this time. There is no discomfort along the plantar medial tubercle of the calcaneus at the insertion of the plantar fascia. There is localized edema to the lateral aspect of the foot without any erythema or increase in warmth. The edema has decreased. There is mild pain with subtalar joint range of motion is no pain with ankle joint range of motion. There is no patella course the posterior tibial tendon. MMT 5/5, ROM WNL. No edema, erythema, increase in warmth to bilateral lower extremities.  No open lesions or pre-ulcerative lesions.  No pain with calf compression, swelling, warmth, erythema  Assessment: 75 year old female with sinus tarsi syndrome, arthritis; resolving  Plan: -All treatment options discussed with the patient including all alternatives, risks, complications.  -At this time transition back into the ankle brace with a supportive shoe gear. There is increase in pain and return of the CAM boot. Discussed steroid injection of the sinus tarsi for which she elected to proceed with today.  Under sterile conditions a total 1.5 mL of a mixture of dexamethasone phosphate and 2% lidocaine plain was infiltrated into the lateral aspect of the foot on the sinus tarsi without complications. Post injection care was discussed with the patient. -Ice and elevation. -Follow-up with Betha for likely [place] (with diabetic shoe?). I completed paperwork today for diabetic shoes. In the meantime, encouraged to call the office with any questions, concerns, change in symptoms. I will see her back in 2 weeks after seeing Betha.   Celesta Gentile, DPM  IMPRESSION: 1. Constellation of findings as discussed above most likely due to sinus tarsi syndrome. Lateral hindfoot impingement syndrome is also possible but I do not see any significant hindfoot valgus or ruptured posterior tibialis tendon. An inflammatory arthropathy is also possible but unlikely. 2. Moderate tibiotalar joint degenerative changes and midfoot degenerative changes. 3. Intact medial and lateral ankle ligaments and tendons. Mild distal posterior tibialis tendinopathy but no tear/rupture.

## 2015-03-06 ENCOUNTER — Ambulatory Visit: Payer: Medicare Other | Admitting: *Deleted

## 2015-03-06 DIAGNOSIS — M2010 Hallux valgus (acquired), unspecified foot: Secondary | ICD-10-CM

## 2015-03-06 DIAGNOSIS — M2040 Other hammer toe(s) (acquired), unspecified foot: Secondary | ICD-10-CM

## 2015-03-06 DIAGNOSIS — M25571 Pain in right ankle and joints of right foot: Secondary | ICD-10-CM

## 2015-03-08 NOTE — Progress Notes (Signed)
Patient presents today for casting of [place] brace and measurements for diabetic shoes and insoles. Patients documentation has not been received by PCP, so will wait on ordering her shoes and insoles, but will go ahead and order brace

## 2015-04-04 ENCOUNTER — Encounter: Payer: Self-pay | Admitting: Podiatry

## 2015-04-04 ENCOUNTER — Ambulatory Visit (INDEPENDENT_AMBULATORY_CARE_PROVIDER_SITE_OTHER): Payer: Medicare Other | Admitting: Podiatry

## 2015-04-04 DIAGNOSIS — M2141 Flat foot [pes planus] (acquired), right foot: Secondary | ICD-10-CM

## 2015-04-04 DIAGNOSIS — M2142 Flat foot [pes planus] (acquired), left foot: Secondary | ICD-10-CM

## 2015-04-04 DIAGNOSIS — M2040 Other hammer toe(s) (acquired), unspecified foot: Secondary | ICD-10-CM

## 2015-04-04 DIAGNOSIS — G5751 Tarsal tunnel syndrome, right lower limb: Secondary | ICD-10-CM

## 2015-04-04 DIAGNOSIS — M19071 Primary osteoarthritis, right ankle and foot: Secondary | ICD-10-CM

## 2015-04-04 DIAGNOSIS — M25571 Pain in right ankle and joints of right foot: Secondary | ICD-10-CM

## 2015-04-04 DIAGNOSIS — M2010 Hallux valgus (acquired), unspecified foot: Secondary | ICD-10-CM

## 2015-04-04 DIAGNOSIS — M722 Plantar fascial fibromatosis: Secondary | ICD-10-CM

## 2015-04-08 IMAGING — CT NM BONE W/ SPECT
1 series · 12 of 14 positions shown, 15 images · non-contrast
Comparison: CT right knee 06/14/2013

CLINICAL DATA: Painful right total knee replacement.  First

EXAM:
NM BONE SCAN THREE-PHASE AND SPECT IMAGING
TECHNIQUE: After intravenous injection of radiopharmaceutical, immediate, blood
pool and delayed planar images were obtained in multiple
projections. Additionally, delayed triplanar SPECT images were
obtained through the area of interest.
RADIOPHARMACEUTICALS:  23.1 technetium 99 MDP

[Series 4: 3d bone 1.25 b70s · axial · 0.98mm/px · z∈[+690,+1021]mm · 12 of 561 slices shown, 15 images]
[im 44/561  soft-tissue]
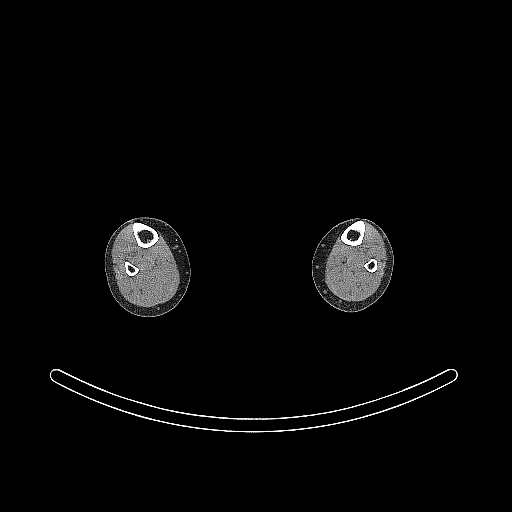
[im 44/561  bone]
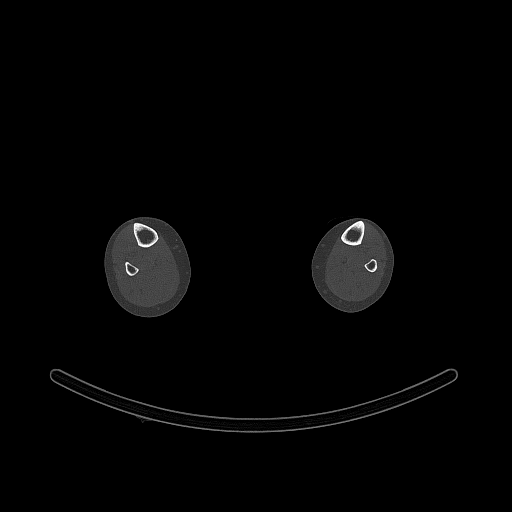
[im 87/561  bone]
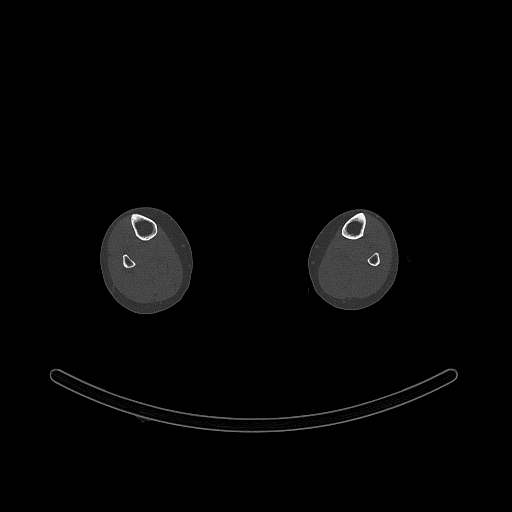
[im 130/561  bone]
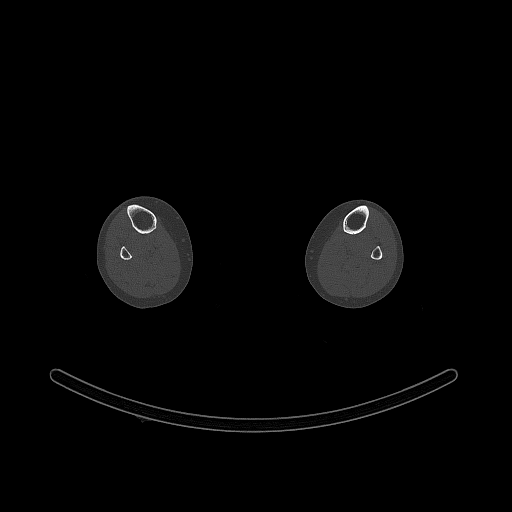
[im 173/561  bone]
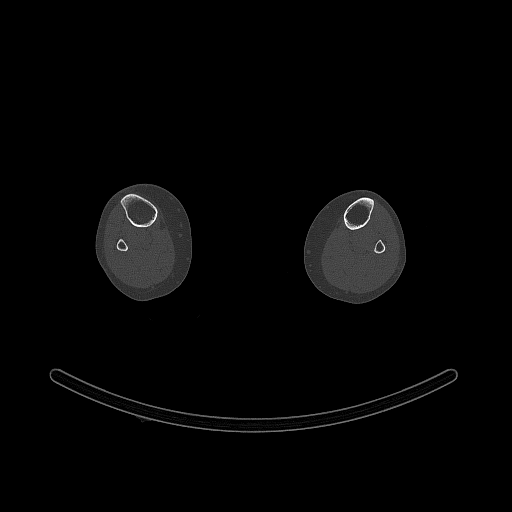
[im 216/561  soft-tissue]
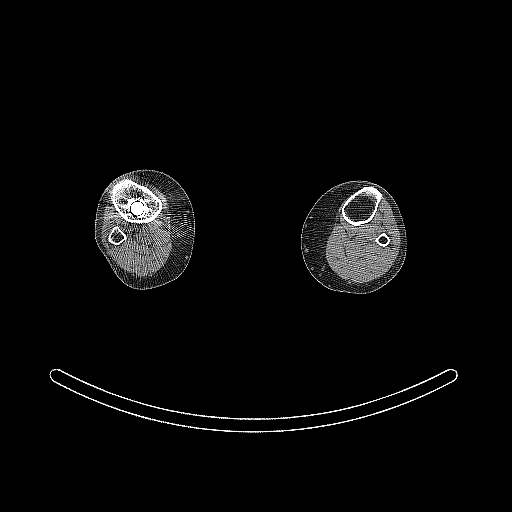
[im 216/561  bone]
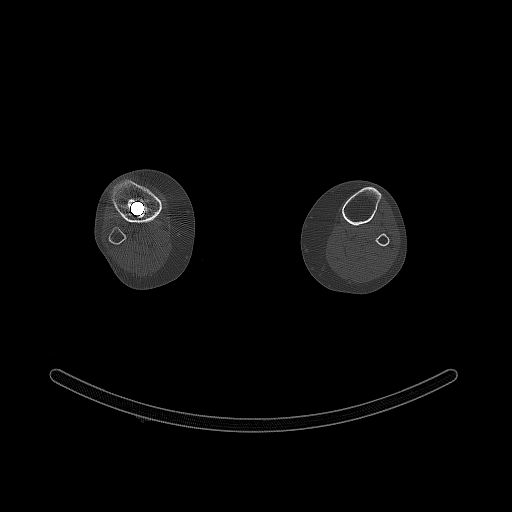
[im 259/561  bone]
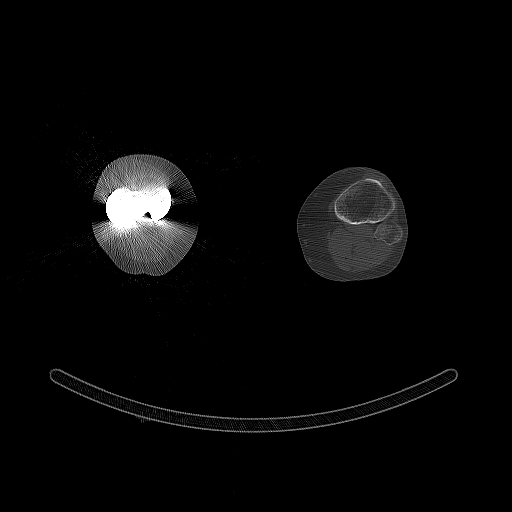
[im 302/561  bone]
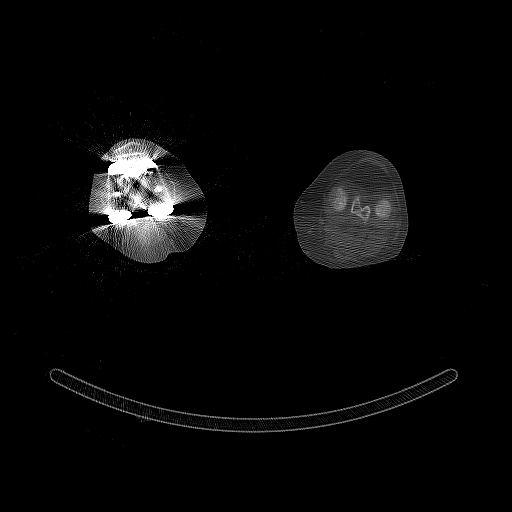
[im 345/561  bone]
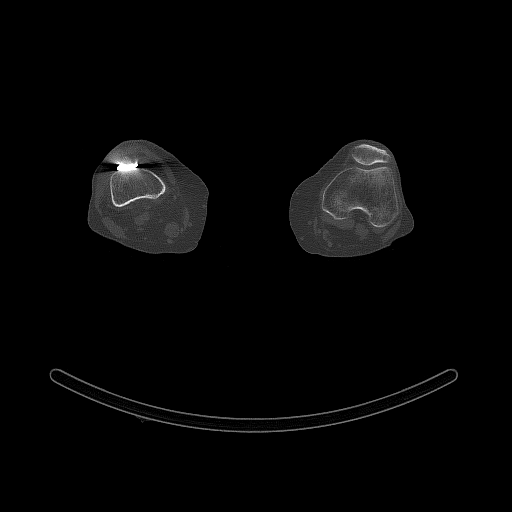
[im 388/561  soft-tissue]
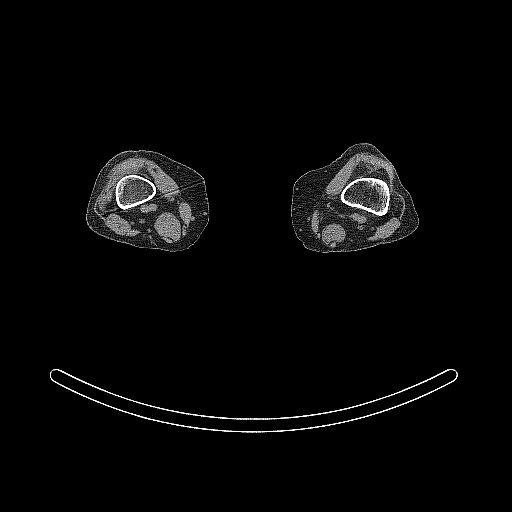
[im 388/561  bone]
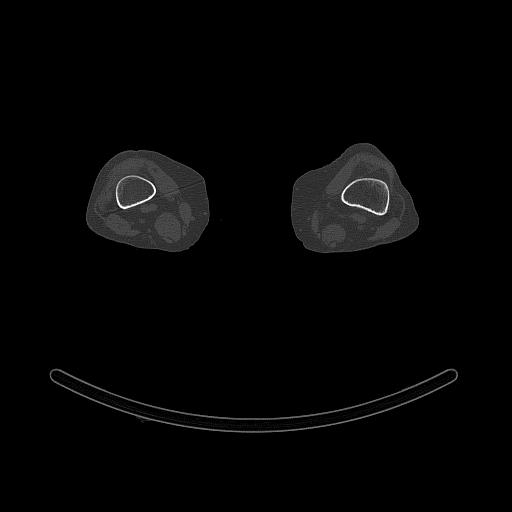
[im 431/561  bone]
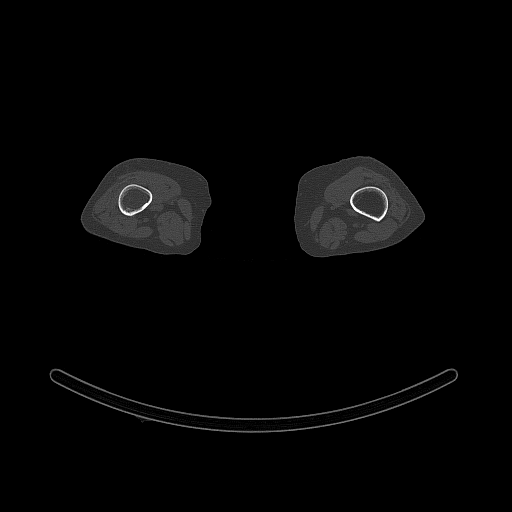
[im 474/561  bone]
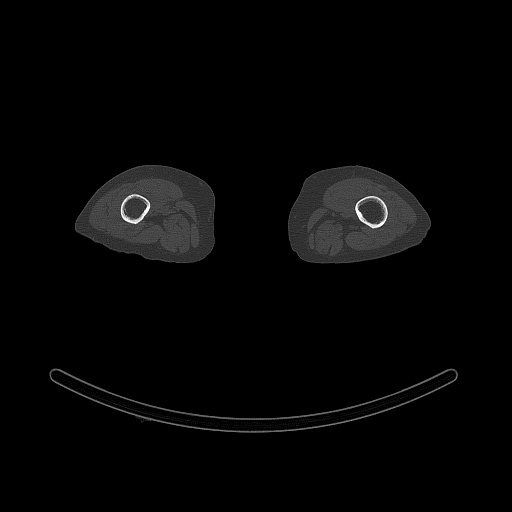
[im 517/561  bone]
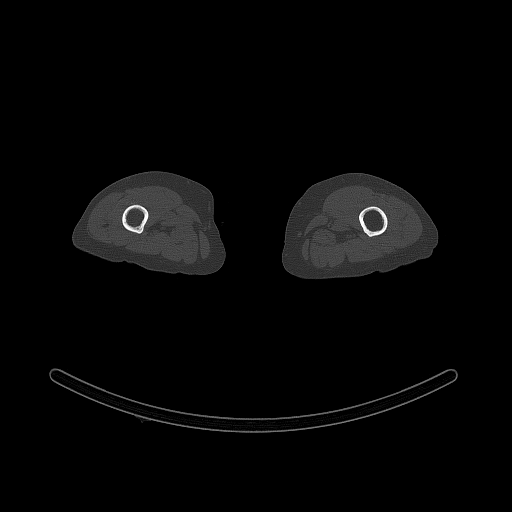

[12 of 14 positions shown; findings below may reference images not displayed]

FINDINGS: Vascular phase: No asymmetric or increased blood flow to the left or
right knee.

Blood pool phase: There is increased blood pool activity within the
joint capsule on the right.

Delayed phase: There is delayed uptake within the right knee
primarily within the tibial plateau and to lesser degree the lateral
femoral condyle.
IMPRESSION: 1. No evidence of loosening or infection of the prosthetic in the
right knee (no abnormal vascular flow to the right knee).
2. Uptake within the right tibial plateau is intense but likely
related to bone remodeling.
3. Uptake within the right joint capsule on the blood pool phase
could represent bursitis / synovitis.

## 2015-04-10 NOTE — Progress Notes (Signed)
Patient ID: Kerri Carter, female   DOB: [birth year], 75 y.o.   MRN: [id number]  Patient was seen by the medical assistant today to pick up brace. Oral and written break in instructions were discussed the patient. She had no new complaints today. She is offered to be seen by myself but she declined. Follow up in 4 weeks for brace check or sooner if any issues.

## 2015-04-17 ENCOUNTER — Ambulatory Visit (INDEPENDENT_AMBULATORY_CARE_PROVIDER_SITE_OTHER): Payer: Medicare Other | Admitting: Pulmonary Disease

## 2015-04-17 ENCOUNTER — Telehealth: Payer: Self-pay | Admitting: Pulmonary Disease

## 2015-04-17 ENCOUNTER — Encounter: Payer: Self-pay | Admitting: Pulmonary Disease

## 2015-04-17 VITALS — BP 128/68 | HR 75 | Ht 64.0 in | Wt 149.6 lb

## 2015-04-17 DIAGNOSIS — R6889 Other general symptoms and signs: Secondary | ICD-10-CM | POA: Diagnosis not present

## 2015-04-17 DIAGNOSIS — J441 Chronic obstructive pulmonary disease with (acute) exacerbation: Secondary | ICD-10-CM

## 2015-04-17 DIAGNOSIS — J209 Acute bronchitis, unspecified: Secondary | ICD-10-CM | POA: Diagnosis not present

## 2015-04-17 LAB — POCT INFLUENZA A/B
Influenza A, POC: NEGATIVE
Influenza B, POC: NEGATIVE

## 2015-04-17 MED ORDER — AZITHROMYCIN 250 MG PO TABS: ORAL_TABLET | ORAL | Status: DC

## 2015-04-17 MED ORDER — PREDNISONE 10 MG PO TABS: ORAL_TABLET | ORAL | Status: DC

## 2015-04-17 MED ORDER — BENZONATATE 200 MG PO CAPS: 200.0000 mg | ORAL_CAPSULE | Freq: Three times a day (TID) | ORAL | Status: DC | PRN

## 2015-04-17 NOTE — Addendum Note (Signed)
Addended by: Virl Cagey on: 04/17/2015 04:21 PM   Modules accepted: Orders

## 2015-04-17 NOTE — Telephone Encounter (Signed)
Pt has been added to VS schedule today at 2:15pm. Nothing further was needed.

## 2015-04-17 NOTE — Patient Instructions (Addendum)
Zithromax 250 mg pill >> 2 pills on day 1, then 1 pill daily for next 4 days Prednisone 10 mg pill >> 3 pills daily for 2 days, 2 pills daily for 2 days, 1 pill daily for 2 days  Follow up with Dr. Lake Bells as schedule on April 30, 2015

## 2015-04-17 NOTE — Addendum Note (Signed)
Addended by: Lorane Gell on: 04/17/2015 03:24 PM   Modules accepted: Orders

## 2015-04-17 NOTE — Progress Notes (Signed)
Current Outpatient Prescriptions on File Prior to Visit  Medication Sig  . albuterol (VENTOLIN HFA) 108 (90 BASE) MCG/ACT inhaler Inhale 2 puffs into the lungs every 6 (six) hours as needed.  Marland Kitchen aspirin 81 MG tablet Take 81 mg by mouth daily.  . budesonide-formoterol (SYMBICORT) 160-4.5 MCG/ACT inhaler Inhale 2 puffs into the lungs 2 (two) times daily. (Patient taking differently: Inhale 2 puffs into the lungs 2 (two) times daily as needed. )  . folic acid (FOLVITE) 1 MG tablet Take 1 mg by mouth daily.  . furosemide (LASIX) 20 MG tablet Take by mouth.  . gabapentin (NEURONTIN) 100 MG capsule Take 3 capsules by mouth 3 (three) times daily.   Marland Kitchen HYDROcodone-acetaminophen (NORCO/VICODIN) 5-325 MG tablet Take 1 tablet by mouth every 6 (six) hours as needed for moderate pain.  Marland Kitchen lovastatin (ALTOPREV) 20 MG 24 hr tablet Take 20 mg by mouth at bedtime.  . methotrexate 50 MG/2ML injection Inject into the skin.  . montelukast (SINGULAIR) 10 MG tablet Take 10 mg by mouth daily.   . nebivolol (BYSTOLIC) 10 MG tablet Take 1 tablet (10 mg total) by mouth daily.  . pantoprazole (PROTONIX) 40 MG tablet Take 40 mg by mouth 2 (two) times daily.   . potassium chloride (K-DUR,KLOR-CON) 10 MEQ tablet Take by mouth.  . predniSONE (DELTASONE) 5 MG tablet Take 5 mg by mouth daily with breakfast.  . ranitidine (ZANTAC) 150 MG tablet Take 150 mg by mouth 2 (two) times daily.  Marland Kitchen saccharomyces boulardii (FLORASTOR) 250 MG capsule Take 250 mg by mouth as needed.   . traZODone (DESYREL) 50 MG tablet take 1 to 3 tablets by mouth at bedtime   No current facility-administered medications on file prior to visit.     Chief Complaint  Patient presents with  . Acute Visit    pt. c/o prod. cough yellow in color, wheezing, increased SOB, sweats, chills, body aches X2d     Past medical hx, Past surgical hx, Allergies, Family hx, Social hx all reviewed.  Vital Signs BP 128/68 mmHg  Pulse 75  Ht 5\' 4"  (1.626 m)  Wt 149  lb 9.6 oz (67.858 kg)  BMI 25.67 kg/m2  SpO2 92%  History of Present Illness Kerri Carter is a 75 y.o. female with COPD.  She is here for acute visit.  She was fine until 2 days ago.  Since then she has felt feverish and fatigued.  She has more cough with yellow sputum.  She has sinus pressure.  She is getting some wheeze.  She was nauseous before, but denies diarrhea.  She denies chest pain, skin rash, leg swelling, or gland swelling.  Her flu swab was negative today.  Physical Exam  General - No distress ENT - mild sinus tenderness b/l, no oral exudate, TM clear, no LAN Cardiac - s1s2 regular, no murmur Chest - decreased air entry, poor air movement, faint b/l wheeze, no crackles Back - No focal tenderness Abd - Soft, non-tender Ext - No edema Neuro - Normal strength Skin - No rashes Psych - normal mood, and behavior   Assessment/Plan  Acute bronchitis with COPD exacerbation. Plan: - prednisone taper - zpak - continue symbicort, singulair and prn albuterol   Patient Instructions  Zithromax 250 mg pill >> 2 pills on day 1, then 1 pill daily for next 4 days Prednisone 10 mg pill >> 3 pills daily for 2 days, 2 pills daily for 2 days, 1 pill daily for 2 days  Follow  up with Dr. Lake Bells as schedule on April 30, 2015     Chesley Mires, MD  Pulmonary/Critical Care/Sleep Pager:  [phone]

## 2015-04-18 ENCOUNTER — Telehealth: Payer: Self-pay | Admitting: Pulmonary Disease

## 2015-04-18 NOTE — Telephone Encounter (Signed)
Per 04/17/15 OV w/ VS for acute visit: Patient Instructions       Zithromax 250 mg pill >> 2 pills on day 1, then 1 pill daily for next 4 days Prednisone 10 mg pill >> 3 pills daily for 2 days, 2 pills daily for 2 days, 1 pill daily for 2 days Follow up with Dr. Lake Bells as schedule on April 30, 2015  ---  Called spoke with pt. She reports she is still not feeling better. She c/o dizziness, no appetite, still coughing yellow-green phlem (thick phlem), fever 99.9, sweats, chills. Denies any body aches. Pt reports she did start the ABX yesterday and the prednisone this AM. Advised pt it will take 1-2 days for ABX to start working but reports she needs this taken care of now. Pt reports she is taking tylenol for fever as well. Requesting further recs. Please advise Dr. Lake Bells thanks

## 2015-04-18 NOTE — Telephone Encounter (Signed)
Patient returned,CB [phone]

## 2015-04-18 NOTE — Telephone Encounter (Signed)
Left message for patient to call back  

## 2015-04-18 NOTE — Telephone Encounter (Signed)
I agree with Dr. Juanetta Gosling treatment plan so I don't have any new suggestions.  Please explain that patients with COPD exacerbations need steroids and antibiotics and he prescribed both.  Even with the best treatment it sometimes takes 2-3 days for symptoms to improve.  If worsening then she either needs to come back in or go to the ER.

## 2015-04-18 NOTE — Telephone Encounter (Signed)
Called spoke with pt and made aware of below. She verbalized understanding and needed nothing further.   

## 2015-04-30 ENCOUNTER — Ambulatory Visit: Payer: Medicare Other | Admitting: Pulmonary Disease

## 2015-05-03 ENCOUNTER — Telehealth: Payer: Self-pay | Admitting: *Deleted

## 2015-05-03 NOTE — Telephone Encounter (Signed)
Left message for patient having trouble getting signed paperwork, spoke with pcp's office given alternate fax number.  We will call when we receive signed documentation.

## 2015-05-03 NOTE — Telephone Encounter (Signed)
Still waiting on documentation from her doctor. Will forward to Melody to advise

## 2015-05-03 NOTE — Telephone Encounter (Signed)
Patient asking about shoes and insoles.

## 2015-06-13 ENCOUNTER — Ambulatory Visit: Payer: Medicare Other | Admitting: Pulmonary Disease

## 2015-06-18 ENCOUNTER — Other Ambulatory Visit: Payer: Self-pay | Admitting: Orthopedic Surgery

## 2015-06-18 DIAGNOSIS — Z96659 Presence of unspecified artificial knee joint: Secondary | ICD-10-CM

## 2015-06-18 DIAGNOSIS — T8484XA Pain due to internal orthopedic prosthetic devices, implants and grafts, initial encounter: Secondary | ICD-10-CM

## 2015-06-21 ENCOUNTER — Encounter
Admission: RE | Admit: 2015-06-21 | Discharge: 2015-06-21 | Disposition: A | Discharge status: Home / Self Care | Payer: Medicare Other | Source: Ambulatory Visit | Attending: Orthopedic Surgery | Admitting: Orthopedic Surgery

## 2015-06-21 DIAGNOSIS — X58XXXA Exposure to other specified factors, initial encounter: Secondary | ICD-10-CM | POA: Diagnosis not present

## 2015-06-21 DIAGNOSIS — T8484XA Pain due to internal orthopedic prosthetic devices, implants and grafts, initial encounter: Secondary | ICD-10-CM | POA: Diagnosis not present

## 2015-06-21 DIAGNOSIS — Z96659 Presence of unspecified artificial knee joint: Secondary | ICD-10-CM | POA: Diagnosis present

## 2015-06-21 MED ORDER — TECHNETIUM TC 99M MEDRONATE IV KIT
25.0000 | PACK | Freq: Once | INTRAVENOUS | Status: AC | PRN
  Administered 2015-06-21: 21.706 via INTRAVENOUS

## 2015-07-09 DIAGNOSIS — I350 Nonrheumatic aortic (valve) stenosis: Secondary | ICD-10-CM

## 2015-07-09 HISTORY — DX: Nonrheumatic aortic (valve) stenosis: I35.0

## 2015-07-11 ENCOUNTER — Ambulatory Visit (INDEPENDENT_AMBULATORY_CARE_PROVIDER_SITE_OTHER): Payer: Medicare Other | Admitting: Neurology

## 2015-07-11 ENCOUNTER — Encounter: Payer: Self-pay | Admitting: Neurology

## 2015-07-11 VITALS — BP 117/62 | HR 67 | Ht 64.0 in | Wt 151.0 lb

## 2015-07-11 DIAGNOSIS — R51 Headache: Secondary | ICD-10-CM | POA: Diagnosis not present

## 2015-07-11 DIAGNOSIS — F05 Delirium due to known physiological condition: Secondary | ICD-10-CM | POA: Diagnosis not present

## 2015-07-11 DIAGNOSIS — R410 Disorientation, unspecified: Secondary | ICD-10-CM

## 2015-07-11 DIAGNOSIS — R519 Headache, unspecified: Secondary | ICD-10-CM

## 2015-07-11 MED ORDER — ALPRAZOLAM 0.5 MG PO TABS: 0.5000 mg | ORAL_TABLET | Freq: Every evening | ORAL | Status: DC | PRN

## 2015-07-11 NOTE — Progress Notes (Signed)
PATIENT: Kerri Carter DOB: 09/16/1940  Chief Complaint  Patient presents with  . Hydrocephalus    MMSE 27/30 - 13 animals. She is here with her husband, Kerri Carter and her daughter-in-law, Kerri Carter.  Reports she has recently been diagnosed with hydrocephalus.  Says she has had two severe headaches, increased confusion, dizzy spells, extremity weakness, fatigue and gait difficulty.  Also, she has had a recent tick bite and they were checking for Lymes Disease at [facility].  She has not gotten her lab results back yet.     HISTORICAL  Kerri Carter is a 75 years old right-handed female, accompanied by her husband and daughter-in-law Kerri Carter, seen in refer by her primary care physician for evaluation of possible hydrocephalus in July 11 2015  I reviewed and summarized referring note, she had a history of asthma, acid reflux disease, peptic ulcer disease, had a history of GI bleeding in May 2014 due to anticoagulation treatment with Kerri Carter, she had DVT following her keep replacement. 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 right knee replacement in June 2015,  She reported two similar spell while she and her husband took on a RV trip at the end of May 2017.  In May 27th 2017, she had sudden onset right parietal area pain, nause, chill, could not move her body, lost control of bladder, mild confusions, lasting for few minutes.  She went to sleep afterwards.  Similar occurrence on June 2nd, she had sudden onset right side headache, could not get up baring weight, confused,   Laboratory evaluation in April 2017, normal CMP with elevated creatinine 1.2, CBC showed mild anemia, with  hemoglobin 11 point 0, She was take to local hospital for evaluation,   I personally reviewed imaging study from [facility] at [place] on June 2017, CAT scan of the brain showed mild generalized atrophy, periventricular small vessel disease, ventriculomegaly, MRA of the brain showed no large vessel disease  Ultrasound of carotid artery showed less than 50% stenosis at bilateral internal carotid artery.  Echocardiogram showed ejection fraction 55-60%, evidence of multiple valvular disease, bubble study show no evidence of PFO.  Chest x-ray showed no acute cardio pulmonary disease,  Around Jun 20 2015, she had a tick bite to her right knee, with erythematous around the blackhead, Lyme titer was checked during hospital admission, I do not know the report  REVIEW OF SYSTEMS: Full 14 system review of systems performed and notable only for fatigue, murmur, swelling in legs, incontinence, anemia, easy bruising or easy bleeding feeling cold joint pain, joint swelling, cramps achy muscles, allergy, skin sensitivity, memory loss, confusion, numbness, weakness, dizziness, insomnia  ALLERGIES: Allergies  Allergen Reactions  . Ceftin [Cefuroxime Axetil] Anaphylaxis    REACTION: tongue and throat swell  . Lisinopril Anaphylaxis    REACTION: tongue and throat swelling (onset 10-10-09)  . Antihistamines, Chlorpheniramine-Type     REACTION: makes pt hyper  . Antivert [Meclizine Hcl]     REACTION: bladder will not empty  . Decongestant [Pseudoephedrine Hcl]     REACTION: makes pt hyper  . Morphine And Related Other (See Comments)    Per patient, low blood pressure issues, following morphine, that requires action to raise it back up.  . Sulfa Antibiotics  Face swelling  . Xarelto [Rivaroxaban]     GI bleed  . Adhesive [Tape] Rash  . Iodine Hives and Rash  . Levaquin [Levofloxacin In D5w] Rash  . Tetanus Toxoids     REACTION: rash, fever, hot to touch at injection site    HOME  MEDICATIONS: Current Outpatient Prescriptions  Medication Sig Dispense Refill  . albuterol (VENTOLIN HFA) 108 (90 BASE) MCG/ACT inhaler Inhale 2 puffs into the lungs every 6 (six) hours as needed. 1 Inhaler 1  . aspirin 81 MG tablet Take 81 mg by mouth daily.    . benzonatate (TESSALON) 200 MG capsule Take 1 capsule (200 mg total) by mouth 3 (three) times daily as needed for cough. 30 capsule 1  . budesonide-formoterol (SYMBICORT) 160-4.5 MCG/ACT inhaler Inhale 2 puffs into the lungs 2 (two) times daily. (Patient taking differently: Inhale 2 puffs into the lungs 2 (two) times daily as needed. ) 1 Inhaler 2  . folic acid (FOLVITE) 1 MG tablet Take 1 mg by mouth daily.    . furosemide (LASIX) 20 MG tablet Take by mouth.    . gabapentin (NEURONTIN) 100 MG capsule Take 3 capsules by mouth 3 (three) times daily.     Marland Kitchen HYDROcodone-acetaminophen (NORCO/VICODIN) 5-325 MG tablet Take 1 tablet by mouth every 6 (six) hours as needed for moderate pain. 30 tablet 0  . lovastatin (ALTOPREV) 20 MG 24 hr tablet Take 20 mg by mouth at bedtime.    . methotrexate 50 MG/2ML injection Inject into the skin.    . montelukast (SINGULAIR) 10 MG tablet Take 10 mg by mouth daily.     . nebivolol (BYSTOLIC) 10 MG tablet Take 1 tablet (10 mg total) by mouth daily. 30 tablet 3  . pantoprazole (PROTONIX) 40 MG tablet Take 40 mg by mouth 2 (two) times daily.     . potassium chloride (K-DUR,KLOR-CON) 10 MEQ tablet Take by mouth.    . predniSONE (DELTASONE) 5 MG tablet Take 5 mg by mouth daily with breakfast.    . ranitidine (ZANTAC) 150 MG tablet Take 150 mg by mouth 2 (two) times daily.    . traZODone (DESYREL) 50 MG tablet take 1 to 3 tablets by mouth at bedtime     No current facility-administered medications for this visit.    PAST MEDICAL HISTORY: Past Medical History  Diagnosis Date  . Hypothyroidism   . HTN (hypertension)   . CRI (chronic renal insufficiency)   . Hyperlipidemia   . Diabetes mellitus ([place])   .  Asthma   . Angioedema   . Anaphylaxis   . Osteoarthritis     hands, low titer rheumatoid factor, carpal tunnel  . Carpal tunnel syndrome   . Hydrocephalus   . Headache   . Weakness   . Dizziness   . Fatigue   . Confusion   . Gait difficulty     PAST SURGICAL HISTORY: Past Surgical History  Procedure Laterality Date  . Nephrectomy  1988    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  . Tubal ligation    . Appendectomy    . Tonsillectomy    . Cesarean section      x1  . Bunionectomy    . Total hip arthroplasty  12/10/11    [facility] left hip    FAMILY HISTORY: Family History  Problem Relation Age of Onset  . Rheum arthritis Mother   . Diabetes Son   . Asthma Mother   . Asthma Sister   .  Lung cancer Sister     was a smoker  . Breast cancer Sister   . Heart disease Sister   . Heart attack Father   . Parkinson's disease Mother   . Diabetes Sister   . Stroke Sister   . Heart attack Sister   . Lung cancer Sister   . Heart disease Brother     SOCIAL HISTORY:  Social History   Social History  . Marital Status: Married    Spouse Name: N/A  . Number of Children: 2  . Years of Education: Some Coll   Occupational History  . Retired    Social History Main Topics  . Smoking status: Former Smoker -- 0.50 packs/day for 20 years    Types: Cigarettes    Quit date: 02/02/1974  . Smokeless tobacco: Never Used  . Alcohol Use: No  . Drug Use: No  . Sexual Activity: Not on file   Other Topics Concern  . Not on file   Social History Narrative   Lives at home with her husband and grandson.   Right-handed.   6 cups coffee per day.     PHYSICAL EXAM   Filed Vitals:   07/11/15 1008  BP: 117/62  Pulse: 67  Height: 5\' 4"  (1.626 m)  Weight: 151 lb (68.493 kg)    Not recorded      Body mass index is 25.91 kg/(m^2).  PHYSICAL EXAMNIATION:  Gen: NAD, conversant, well nourised, obese, well groomed                     Cardiovascular:  Regular rate rhythm, no peripheral edema, warm, nontender. Eyes: Conjunctivae clear without exudates or hemorrhage Neck: Supple, no carotid bruise. Pulmonary: Clear to auscultation bilaterally   NEUROLOGICAL EXAM:  MENTAL STATUS: Speech:    Speech is normal; fluent and spontaneous with normal comprehension.  Cognition:Mini-Mental Status Examination 27/30, animal naming 13     Orientation to time, place and person, she is not oriented to date     recent and remote memory: She missed 2/3 recall     Normal Attention span and concentration     Normal Language, naming, repeating,spontaneous speech     Fund of knowledge   CRANIAL NERVES: CN II: Visual fields are full to confrontation. Fundoscopic exam is normal with sharp discs and no vascular changes. Pupils are round equal and briskly reactive to light. CN III, IV, VI: extraocular movement are normal. No ptosis. CN V: Facial sensation is intact to pinprick in all 3 divisions bilaterally. Corneal responses are intact.  CN VII: Face is symmetric with normal eye closure and smile. CN VIII: Hearing is normal to rubbing fingers CN IX, X: Palate elevates symmetrically. Phonation is normal. CN XI: Head turning and shoulder shrug are intact CN XII: Tongue is midline with normal movements and no atrophy.  MOTOR: There is no pronator drift of out-stretched arms. Muscle bulk and tone are normal. Muscle strength is normal.  REFLEXES: Reflexes are 2+ and symmetric at the biceps, triceps, knees, and ankles. Plantar responses are flexor.  SENSORY: Intact to light touch, pinprick, positional sensation and vibratory sensation are intact in fingers and toes.  COORDINATION: Rapid alternating movements and fine finger movements are intact. There is no dysmetria on finger-to-nose and heel-knee-shin.    GAIT/STANCE: She need assistant to get off from seated position, antalgic, cautious, dragging her right leg, wearing a rigid right ankle  brace   DIAGNOSTIC DATA (LABS, IMAGING, TESTING) - I reviewed  patient records, labs, notes, testing and imaging myself where available.   ASSESSMENT AND PLAN  GERALDENE HOLK is a 75 y.o. female   Episodes of acute onset confusion, urinary incontinence, right parietal headache  Differentiation diagnosis includes partial seizure, versus migraine  Complete evaluation with MRI of brain  Gett medical record from [facility] [place] [address]  [phone]  Document and recording all the events  Mild cognitive impairment  Mini-Mental Status Examination 27/30   Marcial Pacas, M.D. Ph.D.  [facility] [address] [place], [place] [address] Ph: [phone] Fax: [phone]  CC: Coral Spikes, DO

## 2015-07-13 ENCOUNTER — Emergency Department (HOSPITAL_COMMUNITY): Payer: Medicare Other

## 2015-07-13 ENCOUNTER — Emergency Department (HOSPITAL_COMMUNITY)
Admission: EM | Admit: 2015-07-13 | Discharge: 2015-07-13 | Disposition: A | Discharge status: Home / Self Care | Payer: Medicare Other | Attending: Emergency Medicine | Admitting: Emergency Medicine

## 2015-07-13 ENCOUNTER — Encounter (HOSPITAL_COMMUNITY): Payer: Self-pay | Admitting: *Deleted

## 2015-07-13 DIAGNOSIS — Z96642 Presence of left artificial hip joint: Secondary | ICD-10-CM | POA: Insufficient documentation

## 2015-07-13 DIAGNOSIS — Z794 Long term (current) use of insulin: Secondary | ICD-10-CM | POA: Diagnosis not present

## 2015-07-13 DIAGNOSIS — I129 Hypertensive chronic kidney disease with stage 1 through stage 4 chronic kidney disease, or unspecified chronic kidney disease: Secondary | ICD-10-CM | POA: Diagnosis not present

## 2015-07-13 DIAGNOSIS — Z7982 Long term (current) use of aspirin: Secondary | ICD-10-CM | POA: Diagnosis not present

## 2015-07-13 DIAGNOSIS — R51 Headache: Secondary | ICD-10-CM | POA: Insufficient documentation

## 2015-07-13 DIAGNOSIS — E1122 Type 2 diabetes mellitus with diabetic chronic kidney disease: Secondary | ICD-10-CM | POA: Diagnosis not present

## 2015-07-13 DIAGNOSIS — N189 Chronic kidney disease, unspecified: Secondary | ICD-10-CM | POA: Diagnosis not present

## 2015-07-13 DIAGNOSIS — E039 Hypothyroidism, unspecified: Secondary | ICD-10-CM | POA: Diagnosis not present

## 2015-07-13 DIAGNOSIS — J45909 Unspecified asthma, uncomplicated: Secondary | ICD-10-CM | POA: Diagnosis not present

## 2015-07-13 DIAGNOSIS — Z87891 Personal history of nicotine dependence: Secondary | ICD-10-CM | POA: Insufficient documentation

## 2015-07-13 DIAGNOSIS — R519 Headache, unspecified: Secondary | ICD-10-CM

## 2015-07-13 LAB — CBC WITH DIFFERENTIAL/PLATELET
Basophils Absolute: 0 10*3/uL (ref 0.0–0.1)
Basophils Relative: 0 %
Eosinophils Absolute: 0.2 10*3/uL (ref 0.0–0.7)
Eosinophils Relative: 2 %
HCT: 34.1 % — ABNORMAL LOW (ref 36.0–46.0)
Hemoglobin: 10.9 g/dL — ABNORMAL LOW (ref 12.0–15.0)
Lymphocytes Relative: 22 %
Lymphs Abs: 1.7 10*3/uL (ref 0.7–4.0)
MCH: 28.2 pg (ref 26.0–34.0)
MCHC: 32 g/dL (ref 30.0–36.0)
MCV: 88.3 fL (ref 78.0–100.0)
Monocytes Absolute: 0.3 10*3/uL (ref 0.1–1.0)
Monocytes Relative: 4 %
Neutro Abs: 5.6 10*3/uL (ref 1.7–7.7)
Neutrophils Relative %: 72 %
Platelets: 323 10*3/uL (ref 150–400)
RBC: 3.86 MIL/uL — ABNORMAL LOW (ref 3.87–5.11)
RDW: 16.3 % — ABNORMAL HIGH (ref 11.5–15.5)
WBC: 7.8 10*3/uL (ref 4.0–10.5)

## 2015-07-13 LAB — COMPREHENSIVE METABOLIC PANEL
ALT: 15 U/L (ref 14–54)
AST: 20 U/L (ref 15–41)
Albumin: 3.7 g/dL (ref 3.5–5.0)
Alkaline Phosphatase: 70 U/L (ref 38–126)
Anion gap: 6 (ref 5–15)
BUN: 19 mg/dL (ref 6–20)
CO2: 29 mmol/L (ref 22–32)
Calcium: 9.3 mg/dL (ref 8.9–10.3)
Chloride: 104 mmol/L (ref 101–111)
Creatinine, Ser: 1.28 mg/dL — ABNORMAL HIGH (ref 0.44–1.00)
GFR calc Af Amer: 47 mL/min — ABNORMAL LOW (ref >60)
GFR calc non Af Amer: 41 mL/min — ABNORMAL LOW (ref >60)
Glucose, Bld: 109 mg/dL — ABNORMAL HIGH (ref 65–99)
Potassium: 4.1 mmol/L (ref 3.5–5.1)
Sodium: 139 mmol/L (ref 135–145)
Total Bilirubin: 0.6 mg/dL (ref 0.3–1.2)
Total Protein: 6.1 g/dL — ABNORMAL LOW (ref 6.5–8.1)

## 2015-07-13 MED ORDER — GABAPENTIN 300 MG PO CAPS: 300.0000 mg | ORAL_CAPSULE | Freq: Four times a day (QID) | ORAL | Status: DC

## 2015-07-13 MED ORDER — LORAZEPAM 2 MG/ML IJ SOLN
1.0000 mg | Freq: Once | INTRAMUSCULAR | Status: AC
  Administered 2015-07-13: 1 mg via INTRAVENOUS
  Filled 2015-07-13: qty 1

## 2015-07-13 NOTE — ED Notes (Signed)
Per family, pt having episodes over past two weeks of right side headache, dizziness and followed with extreme fatigue and "heaviness" in her legs and arms. Had another episode this am around 11, still has headache and fatigue. Was seen at [facility] on Thursday, called them and they said to bring pt here.

## 2015-07-13 NOTE — ED Provider Notes (Signed)
CSN: [id number]     Arrival date & time 07/13/15  1246 History   None    Chief Complaint  Patient presents with  . Weakness  . Dizziness     (Consider location/radiation/quality/duration/timing/severity/associated sxs/prior Treatment) Patient is a 75 y.o. female presenting with weakness and dizziness. The history is provided by the patient.  Weakness Associated symptoms include chills, headaches, nausea and weakness. Pertinent negatives include no abdominal pain, chest pain, congestion, fever, myalgias, numbness, rash, sore throat or vomiting.  Dizziness Associated symptoms: headaches, nausea and weakness   Associated symptoms: no chest pain, no diarrhea, no shortness of breath and no vomiting    Patient is a 75 yo F with a PMHx of HTN, HLD, Chronic renal insufficiency, DM2, hypothyroidism presenting to the ED complaining of Right sided headache which started 12 days ago. States she has had 3 "spells" since then; most recent this morning. States the headache is "throbbing" in character and radiates to the right side of her face. It is associated with photophobia, nausea, and lightheadedness. Denies any episodes of vomiting. This morning it lasted approximately 10 mins. States the headache is also associated with generalized weakness in her arms and legs bilaterally. Patient was accompanied by her daughter-in-law who states even after the headache subsides, patient's weakness continues. States this morning patient was "stuttering" when replying to questions and it lasted about 5 mins. Also states patient appeared drowsy but did not lose consciousness. Family did not notice any seizure-like activity or loss of bowel or bladder function this morning. Family called [place] [facility] and were advised by Dr. Leonie Man to bring the patient into the ED. Patient states her headache has now resolved but she continues to have weakness in her arms and legs. Denies having any changes in her vision. States she took  her regular medications this morning; no new medications. Patient denies having any fevers but states she has chills during these episodes. Also reports having a tick bite 3 weeks ago for which she took Doxycycline for a week. States her neurologist has scheduled her for an EEG in July and MRI is not scheduled yet. Patient is requesting for these tests to be done now.    Past Medical History  Diagnosis Date  . Hypothyroidism   . HTN (hypertension)   . CRI (chronic renal insufficiency)   . Hyperlipidemia   . Diabetes mellitus ([place])   . Asthma   . Angioedema   . Anaphylaxis   . Osteoarthritis     hands, low titer rheumatoid factor, carpal tunnel  . Carpal tunnel syndrome   . Hydrocephalus   . Headache   . Weakness   . Dizziness   . Fatigue   . Confusion   . Gait difficulty    Past Surgical History  Procedure Laterality Date  . Nephrectomy  1988    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  . Tubal ligation    . Appendectomy    . Tonsillectomy    . Cesarean section      x1  . Bunionectomy    . Total hip arthroplasty  12/10/11    [facility] left hip   Family History  Problem Relation Age of Onset  . Rheum arthritis Mother   . Diabetes Son   . Asthma Mother   . Asthma Sister   . Lung cancer Sister     was a smoker  . Breast cancer Sister   . Heart disease Sister   . Heart  attack Father   . Parkinson's disease Mother   . Diabetes Sister   . Stroke Sister   . Heart attack Sister   . Lung cancer Sister   . Heart disease Brother    Social History  Substance Use Topics  . Smoking status: Former Smoker -- 0.50 packs/day for 20 years    Types: Cigarettes    Quit date: 02/02/1974  . Smokeless tobacco: Never Used  . Alcohol Use: No   OB History    No data available     Review of Systems  Constitutional: Positive for chills. Negative for fever.  HENT: Negative for congestion and sore throat.   Eyes: Positive for photophobia. Negative for visual  disturbance.  Respiratory: Negative for shortness of breath and wheezing.   Cardiovascular: Negative for chest pain and leg swelling.  Gastrointestinal: Positive for nausea. Negative for vomiting, abdominal pain and diarrhea.  Genitourinary: Negative for dysuria and flank pain.  Musculoskeletal: Negative for myalgias and neck stiffness.  Skin: Negative for rash.  Neurological: Positive for dizziness, speech difficulty, weakness and headaches. Negative for numbness.      Allergies  Ceftin; Lisinopril; Antihistamines, chlorpheniramine-type; Antivert; Decongestant; Morphine and related; Sulfa antibiotics; Xarelto; Adhesive; Iodine; Levaquin; and Tetanus toxoids  Home Medications   Prior to Admission medications   Medication Sig Start Date End Date Taking? Authorizing Provider  albuterol (VENTOLIN HFA) 108 (90 BASE) MCG/ACT inhaler Inhale 2 puffs into the lungs every 6 (six) hours as needed. 04/14/12   Tanda Rockers, MD  ALPRAZolam Duanne Moron) 0.5 MG tablet Take 1 tablet (0.5 mg total) by mouth at bedtime as needed for anxiety. 07/11/15   Marcial Pacas, MD  aspirin 81 MG tablet Take 81 mg by mouth daily.    Historical Provider, MD  benzonatate (TESSALON) 200 MG capsule Take 1 capsule (200 mg total) by mouth 3 (three) times daily as needed for cough. 04/17/15   Chesley Mires, MD  budesonide-formoterol (SYMBICORT) 160-4.5 MCG/ACT inhaler Inhale 2 puffs into the lungs 2 (two) times daily. Patient taking differently: Inhale 2 puffs into the lungs 2 (two) times daily as needed.  09/06/12   Juanito Doom, MD  doxycycline (VIBRA-TABS) 100 MG tablet  07/07/15   Historical Provider, MD  escitalopram (LEXAPRO) 5 MG tablet Take 5 mg by mouth daily. 04/24/15   Historical Provider, MD  folic acid (FOLVITE) 1 MG tablet Take 1 mg by mouth daily.    Historical Provider, MD  furosemide (LASIX) 20 MG tablet Take by mouth. 09/20/14 09/20/15  Historical Provider, MD  gabapentin (NEURONTIN) 300 MG capsule 3 (three) times daily.   07/02/15   Historical Provider, MD  HYDROcodone-acetaminophen (NORCO/VICODIN) 5-325 MG tablet Take 1 tablet by mouth every 6 (six) hours as needed for moderate pain. 01/15/15   Landis Martins, DPM  Insulin Syringe 27G X 1/2" 0.5 ML MISC by Does not apply route.    Historical Provider, MD  LORazepam (ATIVAN) 1 MG tablet  06/20/15   Historical Provider, MD  lovastatin (ALTOPREV) 20 MG 24 hr tablet Take 20 mg by mouth at bedtime.    Historical Provider, MD  methotrexate 50 MG/2ML injection Inject into the skin. Inject .17ml once weekly. 09/27/14   Historical Provider, MD  montelukast (SINGULAIR) 10 MG tablet Take 10 mg by mouth daily.     Historical Provider, MD  multivitamin-lutein (OCUVITE-LUTEIN) CAPS capsule Take 1 capsule by mouth 2 (two) times daily.    Historical Provider, MD  nebivolol (BYSTOLIC) 10 MG tablet Take 1  tablet (10 mg total) by mouth daily. 07/06/14   Juanito Doom, MD  pantoprazole (PROTONIX) 40 MG tablet Take 40 mg by mouth 2 (two) times daily.     Historical Provider, MD  potassium chloride (K-DUR,KLOR-CON) 10 MEQ tablet Take by mouth. 09/20/14 09/20/15  Historical Provider, MD  predniSONE (DELTASONE) 5 MG tablet Take 5 mg by mouth daily with breakfast.    Historical Provider, MD  ranitidine (ZANTAC) 150 MG tablet Take 150 mg by mouth 2 (two) times daily.    Historical Provider, MD  traZODone (DESYREL) 50 MG tablet Takes 2 tablets by mouth at bedtime 09/03/14   Historical Provider, MD   BP 116/61 mmHg  Pulse 68  Temp(Src) 98.1 F (36.7 C) (Oral)  Resp 16  Ht 5\' 4"  (1.626 m)  Wt 68.04 kg  BMI 25.73 kg/m2  SpO2 98% Physical Exam  Constitutional: She is oriented to person, place, and time. She appears well-developed and well-nourished. No distress.  HENT:  Head: Normocephalic and atraumatic.  Mouth/Throat: Oropharynx is clear and moist.  Eyes: EOM are normal. Pupils are equal, round, and reactive to light.  Neck: Normal range of motion. Neck supple. No tracheal deviation  present.  Cardiovascular: Normal rate, regular rhythm and intact distal pulses.  Exam reveals no gallop and no friction rub.   Pulmonary/Chest: Effort normal and breath sounds normal. No respiratory distress. She has no wheezes. She has no rales.  Abdominal: Soft. Bowel sounds are normal. She exhibits no distension. There is no tenderness. There is no guarding.  Musculoskeletal: She exhibits no edema.  Neurological: She is alert and oriented to person, place, and time. No cranial nerve deficit. Coordination normal.  Speech is fluent  Strength 5/5 and sensation to light touch intact in bilateral upper and lower extremities.  Kernig sign negative Brudzinski sign negative   Skin: Skin is warm and dry. No rash noted. She is not diaphoretic. No erythema.    ED Course  Procedures (including critical care time) Labs Review Labs Reviewed - No data to display  Imaging Review No results found. I have personally reviewed and evaluated these images and lab results as part of my medical decision-making.   EKG Interpretation None      MDM   Final diagnoses:  None   Headache Patient is presenting with a history of R sided headache radiating to the R side of her face a/w nausea, photophobia, lightheadedness, speech difficulty, and generalized weakness in arms and legs bilaterally. Her headache and speech difficulty have resolved. She is afebrile and does not have leukocytosis. No focal deficits seen on neuro exam and speech is fluent. Kernig sign negative and Brudzinski's sign negative. She was seen by Dr. Krista Blue (neurology) and his differential included partial seizure vs. Migraine. I spoke to Dr. Leonel Ramsay and he is recommending ordering an MRI of her brain. He is also recommending increasing the dose of her home medication Gabapentin from 300 mg TID to 300 mg QID.   My shift has ended. I have signed off the patient to Dr. Jarold Song at 3:18 pm.   -MRI brain wo contrast pending  -CMP pending       Shela Leff, MD 07/13/15 [place], MD 07/14/15 [phone]

## 2015-07-13 NOTE — ED Notes (Signed)
Wasted Ativan 1mg  with Burnice Logan, RN.  Pt was no longer listed in pyxis at time of waste.

## 2015-07-13 NOTE — ED Provider Notes (Signed)
Care of pt taken over from Dr. Marlowe Sax at 3pm  In brief, pt here for evaluation of headache 12 days. Sent from neurologist. Patient is currently pending CMP and MRI of her head. If negative, planned to reach out to neurology again and likely sent home with prescription for gabapentin 4 times a day.  MRI without acute abnormality. No hydrocephalus noted. CMP without significant abnormality.  Discussed results with neurology who again reiterated no need for further inpatient workup. Patient referred to outpatient neurologist who will likely perform a nonemergent EEG. Reassessed patient prior to discharge who states she is no longer having a headache. Return for any severe, progressively worse headache, no focal neurological deficits, intractable vomiting, or other concerning symptoms. Patient verbalized understanding and agreement with plan. Discharged with an extra dose of gabapentin from her baseline (QID).  Karma Greaser, MD 07/14/15 0021  Gareth Morgan, MD 07/14/15 1253

## 2015-07-13 NOTE — ED Notes (Signed)
Patient transported to MRI 

## 2015-07-15 ENCOUNTER — Encounter: Payer: Self-pay | Admitting: Family Medicine

## 2015-07-15 ENCOUNTER — Ambulatory Visit (INDEPENDENT_AMBULATORY_CARE_PROVIDER_SITE_OTHER): Payer: Medicare Other | Admitting: Family Medicine

## 2015-07-15 VITALS — BP 122/64 | HR 73 | Temp 98.2°F | Ht 64.0 in | Wt 148.0 lb

## 2015-07-15 DIAGNOSIS — F05 Delirium due to known physiological condition: Secondary | ICD-10-CM

## 2015-07-15 DIAGNOSIS — I10 Essential (primary) hypertension: Secondary | ICD-10-CM

## 2015-07-15 DIAGNOSIS — Z1239 Encounter for other screening for malignant neoplasm of breast: Secondary | ICD-10-CM

## 2015-07-15 DIAGNOSIS — J432 Centrilobular emphysema: Secondary | ICD-10-CM

## 2015-07-15 DIAGNOSIS — F32A Depression, unspecified: Secondary | ICD-10-CM

## 2015-07-15 DIAGNOSIS — R410 Disorientation, unspecified: Secondary | ICD-10-CM

## 2015-07-15 DIAGNOSIS — E11319 Type 2 diabetes mellitus with unspecified diabetic retinopathy without macular edema: Secondary | ICD-10-CM

## 2015-07-15 DIAGNOSIS — E785 Hyperlipidemia, unspecified: Secondary | ICD-10-CM

## 2015-07-15 DIAGNOSIS — F329 Major depressive disorder, single episode, unspecified: Secondary | ICD-10-CM

## 2015-07-15 DIAGNOSIS — G4700 Insomnia, unspecified: Secondary | ICD-10-CM

## 2015-07-15 DIAGNOSIS — F419 Anxiety disorder, unspecified: Secondary | ICD-10-CM

## 2015-07-15 NOTE — Patient Instructions (Signed)
Stop the Xanax (Alprazalam).  Continue your other medications.  Be sure to follow up with your specialists.  Follow up in 1 month.  Take care  Dr. Lacinda Axon

## 2015-07-16 ENCOUNTER — Ambulatory Visit (INDEPENDENT_AMBULATORY_CARE_PROVIDER_SITE_OTHER): Payer: Medicare Other | Admitting: Pulmonary Disease

## 2015-07-16 ENCOUNTER — Encounter: Payer: Self-pay | Admitting: Pulmonary Disease

## 2015-07-16 VITALS — BP 128/68 | HR 61 | Ht 64.0 in | Wt 147.4 lb

## 2015-07-16 DIAGNOSIS — Z5181 Encounter for therapeutic drug level monitoring: Secondary | ICD-10-CM | POA: Diagnosis not present

## 2015-07-16 NOTE — Patient Instructions (Addendum)
Keep taking your medications as you're doing In October when you did take Symbicort 2 puffs twice a day, no matter how you feel I will see you back in October with a lung function test

## 2015-07-16 NOTE — Assessment & Plan Note (Signed)
She has a flare up of her COPD in the winter primarily due to the fact that she had a URI and uses her Symbicort PRN. Because she has all of her exacerbations in the winter I believe she needs to use Symbicort regularly from October through April.  Plan: Start using Symbicort 2 puffs twice a day October through April, after that can be when necessary if symptoms are stable We will check a PFT in the fall considering her methotrexate use Follow-up in October

## 2015-07-16 NOTE — Progress Notes (Signed)
Subjective:    Patient ID: Kerri Carter, female    DOB: 12/07/1940, 75 y.o.   MRN: [id number]  Synopsis: Kerri Carter established care for COPD at the [facility] office in 08/2011.  Had PFTs in 04/2011 in March 2013 which showed: Ratio 70%, clear obstruction on flow volume loop, FEV1 1.79 L (81% predicted.), Total lung capacity 5.3 L (115% predicted), residual volume 2.49 L (136% predicted), DLCO 84% predicted. She smoked 0.5packs per day for 20 years.    HPI  Chief Complaint  Patient presents with  . Follow-up    Pt has no breathing complaints at this time.      RA> seems to be well controleld on her methotrexate; had some ankle pain recently.  COPD> had a flare up earlier in the year and had flare up; has been stable since then.  Symbicort as needed right now.  No dyspnea.    Cough> minimal, never producing mucus.    Past Medical History  Diagnosis Date  . HTN (hypertension)   . CKD (chronic kidney disease)   . Hyperlipidemia   . Asthma   . Rheumatoid aortitis ([facility])     hands, low titer rheumatoid factor, carpal tunnel  . Carpal tunnel syndrome   . Chicken pox      Review of Systems  Constitutional: Negative for chills, activity change and fatigue.  Respiratory: Positive for cough, shortness of breath and wheezing. Negative for stridor.   Cardiovascular: Negative for chest pain, palpitations and leg swelling.       Objective:   Physical Exam  Filed Vitals:   07/16/15 1425  BP: 128/68  Pulse: 61  Height: 5\' 4"  (1.626 m)  Weight: 147 lb 6.4 oz (66.86 kg)  SpO2: 97%  RA  Gen: chronically  ill appearing, no acute distress HEENT: NCAT, EOMi, OP clear,  PULM: No wheezing on my exam, normal effort CV: RRR, no mgr, no JVD AB: BS+, soft, nontender, no hsm Ext: warm, no edema, no clubbing, no cyanosis  Records from her visit with Dr. Halford Chessman were reviewed where she had a COPD exacerbation.     Assessment & Plan:   COPD (chronic obstructive pulmonary disease)  ([place]) She has a flare up of her COPD in the winter primarily due to the fact that she had a URI and uses her Symbicort PRN. Because she has all of her exacerbations in the winter I believe she needs to use Symbicort regularly from October through April.  Plan: Start using Symbicort 2 puffs twice a day October through April, after that can be when necessary if symptoms are stable We will check a PFT in the fall considering her methotrexate use Follow-up in October    Updated Medication List Outpatient Encounter Prescriptions as of 07/16/2015  Medication Sig  . albuterol (VENTOLIN HFA) 108 (90 BASE) MCG/ACT inhaler Inhale 2 puffs into the lungs every 6 (six) hours as needed.  Marland Kitchen aspirin 81 MG tablet Take 81 mg by mouth daily.  . benzonatate (TESSALON) 200 MG capsule Take 1 capsule (200 mg total) by mouth 3 (three) times daily as needed for cough.  . budesonide-formoterol (SYMBICORT) 160-4.5 MCG/ACT inhaler Inhale 2 puffs into the lungs 2 (two) times daily. (Patient taking differently: Inhale 2 puffs into the lungs 2 (two) times daily as needed. )  . escitalopram (LEXAPRO) 5 MG tablet Take 5 mg by mouth daily.  . folic acid (FOLVITE) 1 MG tablet Take 1 mg by mouth daily.  . furosemide (LASIX) 20  MG tablet Take 20 mg by mouth daily.   Marland Kitchen gabapentin (NEURONTIN) 300 MG capsule Take 1 capsule (300 mg total) by mouth 4 (four) times daily.  Marland Kitchen HYDROcodone-acetaminophen (NORCO/VICODIN) 5-325 MG tablet Take 1 tablet by mouth every 6 (six) hours as needed for moderate pain.  Marland Kitchen LORazepam (ATIVAN) 1 MG tablet Take 1 mg by mouth 2 (two) times daily.   Marland Kitchen lovastatin (ALTOPREV) 20 MG 24 hr tablet Take 20 mg by mouth at bedtime.  . methotrexate 50 MG/2ML injection Inject into the skin once a week. Inject .77ml once weekly.  . montelukast (SINGULAIR) 10 MG tablet Take 10 mg by mouth daily.   . multivitamin-lutein (OCUVITE-LUTEIN) CAPS capsule Take 1 capsule by mouth 2 (two) times daily.  . nebivolol (BYSTOLIC) 10  MG tablet Take 1 tablet (10 mg total) by mouth daily. (Patient taking differently: Take 5 mg by mouth daily. )  . pantoprazole (PROTONIX) 40 MG tablet Take 40 mg by mouth 2 (two) times daily.   . potassium chloride (K-DUR,KLOR-CON) 10 MEQ tablet Take 10 mEq by mouth daily.   . ranitidine (ZANTAC) 150 MG tablet Take 150 mg by mouth 2 (two) times daily.  . traZODone (DESYREL) 100 MG tablet Take 200 mg by mouth at bedtime.   No facility-administered encounter medications on file as of 07/16/2015.

## 2015-07-17 ENCOUNTER — Telehealth: Payer: Self-pay | Admitting: Neurology

## 2015-07-17 ENCOUNTER — Encounter: Payer: Self-pay | Admitting: Family Medicine

## 2015-07-17 DIAGNOSIS — H3530 Unspecified macular degeneration: Secondary | ICD-10-CM | POA: Insufficient documentation

## 2015-07-17 DIAGNOSIS — H35389 Toxic maculopathy, unspecified eye: Secondary | ICD-10-CM

## 2015-07-17 DIAGNOSIS — E119 Type 2 diabetes mellitus without complications: Secondary | ICD-10-CM | POA: Insufficient documentation

## 2015-07-17 DIAGNOSIS — G4700 Insomnia, unspecified: Secondary | ICD-10-CM | POA: Insufficient documentation

## 2015-07-17 DIAGNOSIS — T372X5A Adverse effect of antimalarials and drugs acting on other blood protozoa, initial encounter: Secondary | ICD-10-CM

## 2015-07-17 DIAGNOSIS — E785 Hyperlipidemia, unspecified: Secondary | ICD-10-CM | POA: Insufficient documentation

## 2015-07-17 HISTORY — DX: Toxic maculopathy, unspecified eye: H35.389

## 2015-07-17 HISTORY — DX: Insomnia, unspecified: G47.00

## 2015-07-17 NOTE — Telephone Encounter (Signed)
Kerri Carter the patient's daughter-in-law is calling. The patient was seen at the ER this past Saturday with seizure symptoms and a bad headache. MRI was ok. The patient is scheduled for an EEG in our office on 08-13-15. Please call and discuss as what needs to be done until the patient has the EEG.Marland Kitchen

## 2015-07-17 NOTE — Assessment & Plan Note (Signed)
Stable. Continue Symbicort per pulm.

## 2015-07-17 NOTE — Progress Notes (Signed)
Subjective:  Patient ID: Kerri Carter, female    DOB: 26-Nov-1940  Age: 75 y.o. MRN: [id number]  CC: Establish care  HPI Kerri Carter is a 75 y.o. female presents to the clinic today to establish care. Concerns/issues are below.  ? Seizure  Patient recently been experiencing "spells"  She states that it started with a severe headache (right parietal) and then progressed to inability to move, incontinence, confusion.  She has been seen by neurology.  She had unremarkable MRI.  Differential diagnosis - seizure versus complicated migraine.  HTN  Well controlled on Bystolic.  DM-2  Chart reflects a history of diabetes. I can find no record of an A1c above 6.4.  She does have evidence of retinopathy per her ophthalmologist.  Most recent A1c was 5.7 in December of last year.  Patient currently diet controlled and not taking any medication.  Anxiety and depression  Patient seems to be doing well on Lexapro.  I am concerned as the chart reflects that she's taking both Xanax and Ativan. She seems to be unclear of whether she is actually taking both of these medications.  We'll discuss this today.  HLD  Well controlled (LDL on labs 01/2015 was 77).  Currently on Lovastatin and tolerating.  COPD  Stable.  Followed by Maylon Peppers.  Only using Symbicort during winter months.  Insomnia  Stable on Trazodone.  PMH, Surgical Hx, Family Hx, Social History reviewed and updated as below.  Past Medical History  Diagnosis Date  . HTN (hypertension)   . CKD (chronic kidney disease)   . Hyperlipidemia   . Rheumatoid aortitis (HCC)     hands, low titer rheumatoid factor, carpal tunnel  . Chicken pox   . COPD (chronic obstructive pulmonary disease) ([place])   . Toxic maculopathy   . DM type 2 (diabetes mellitus, type 2) ([place])    Past Surgical History  Procedure Laterality Date  . 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 Tubal ligation    . Appendectomy    . Tonsillectomy    . Cesarean section      x1  . Bunionectomy    . Total hip arthroplasty  12/10/11    [facility] left hip   Family History  Problem Relation Age of Onset  . Rheum arthritis Mother   . Asthma Mother   . Parkinson's disease Mother   . Heart disease Mother   . Stroke Mother   . Hypertension Mother   . Diabetes Son   . Asthma Sister   . Lung cancer Sister     was a smoker  . Breast cancer Sister   . Heart disease Sister   . Heart attack Father   . Heart disease Father   . Hypertension Father   . Diabetes Sister   . Stroke Sister   . Heart attack Sister   . Lung cancer Sister   . Heart disease Brother   . Alcohol abuse Brother   . Heart disease Maternal Grandmother   . Diabetes Maternal Grandmother   . Colon cancer Maternal Grandmother    Social History  Substance Use Topics  . Smoking status: Former Smoker -- 0.50 packs/day for 20 years    Types: Cigarettes    Quit date: 02/02/1974  . Smokeless tobacco: Never Used  . Alcohol Use: No   Review of Systems  Respiratory: Positive for cough and shortness of breath.   Genitourinary:  Positive for frequency.  Musculoskeletal: Positive for arthralgias.  All other systems reviewed and are negative.  Objective:   Today's Vitals: BP 122/64 mmHg  Pulse 73  Temp(Src) 98.2 F (36.8 C) (Oral)  Ht 5\' 4"  (1.626 m)  Wt 148 lb (67.132 kg)  BMI 25.39 kg/m2  SpO2 97%  Physical Exam  Constitutional: She is oriented to person, place, and time.  Well appearing elderly female in NAD.   HENT:  Head: Normocephalic and atraumatic.  Mouth/Throat: Oropharynx is clear and moist.  Dentures.   Eyes: Conjunctivae are normal. No scleral icterus.  Neck: Neck supple.  Cardiovascular: Normal rate and regular rhythm.   2/6 systolic murmur.  Pulmonary/Chest: Effort normal. She has no wheezes. She has no rales.  Abdominal: Soft. She exhibits no distension. There is no tenderness.  There is no rebound and no guarding.  Musculoskeletal: Normal range of motion.  Neurological: She is alert and oriented to person, place, and time.  Skin: Skin is warm and dry. No rash noted.  Psychiatric: She has a normal mood and affect.  Vitals reviewed.  Assessment & Plan:   Problem List Items Addressed This Visit    Anxiety and depression    Stable. Advised to stop Xanax. Continuing Ativan and lexapro. Cautioned about use of Ativan in the elderly. I informed her that if her "spells" continue I would taper and discontinue Ativan.      COPD (chronic obstructive pulmonary disease) (HCC)    Stable. Continue Symbicort per pulm.      DM type 2 (diabetes mellitus, type 2) (HCC)    Stable, diet controlled. Will continue to monitor. Getting regular eye exams.      Hyperlipidemia    Stable. Continuing lovastatin.      Hypertension    Stable on Bystolic.      Insomnia    Stable on Trazodone.      Subacute confusional state - Primary    Unclear etiology. Followed by neurology. Negative MRI. Scheduled for follow up/EEG.       Other Visit Diagnoses    Screening for breast cancer        Relevant Orders    MM DIGITAL SCREENING BILATERAL       Outpatient Encounter Prescriptions as of 07/15/2015  Medication Sig  . albuterol (VENTOLIN HFA) 108 (90 BASE) MCG/ACT inhaler Inhale 2 puffs into the lungs every 6 (six) hours as needed.  Marland Kitchen aspirin 81 MG tablet Take 81 mg by mouth daily.  . benzonatate (TESSALON) 200 MG capsule Take 1 capsule (200 mg total) by mouth 3 (three) times daily as needed for cough.  . budesonide-formoterol (SYMBICORT) 160-4.5 MCG/ACT inhaler Inhale 2 puffs into the lungs 2 (two) times daily. (Patient taking differently: Inhale 2 puffs into the lungs 2 (two) times daily as needed. )  . escitalopram (LEXAPRO) 5 MG tablet Take 5 mg by mouth daily.  . folic acid (FOLVITE) 1 MG tablet Take 1 mg by mouth daily.  . furosemide (LASIX) 20 MG tablet Take 20 mg  by mouth daily.   Marland Kitchen gabapentin (NEURONTIN) 300 MG capsule Take 1 capsule (300 mg total) by mouth 4 (four) times daily.  Marland Kitchen HYDROcodone-acetaminophen (NORCO/VICODIN) 5-325 MG tablet Take 1 tablet by mouth every 6 (six) hours as needed for moderate pain.  Marland Kitchen LORazepam (ATIVAN) 1 MG tablet Take 1 mg by mouth 2 (two) times daily.   Marland Kitchen lovastatin (ALTOPREV) 20 MG 24 hr tablet Take 20 mg by mouth at bedtime.  . methotrexate  50 MG/2ML injection Inject into the skin once a week. Inject .7ml once weekly.  . montelukast (SINGULAIR) 10 MG tablet Take 10 mg by mouth daily.   . multivitamin-lutein (OCUVITE-LUTEIN) CAPS capsule Take 1 capsule by mouth 2 (two) times daily.  . nebivolol (BYSTOLIC) 10 MG tablet Take 1 tablet (10 mg total) by mouth daily. (Patient taking differently: Take 5 mg by mouth daily. )  . pantoprazole (PROTONIX) 40 MG tablet Take 40 mg by mouth 2 (two) times daily.   . potassium chloride (K-DUR,KLOR-CON) 10 MEQ tablet Take 10 mEq by mouth daily.   . ranitidine (ZANTAC) 150 MG tablet Take 150 mg by mouth 2 (two) times daily.  . traZODone (DESYREL) 100 MG tablet Take 200 mg by mouth at bedtime.  . [DISCONTINUED] ALPRAZolam (XANAX) 0.5 MG tablet Take 1 tablet (0.5 mg total) by mouth at bedtime as needed for anxiety.   No facility-administered encounter medications on file as of 07/15/2015.    Follow-up: Return in about 1 month (around 08/14/2015) for Follow up Chronic medical issues.  [place]

## 2015-07-17 NOTE — Assessment & Plan Note (Signed)
Stable, diet controlled. Will continue to monitor. Getting regular eye exams.

## 2015-07-17 NOTE — Assessment & Plan Note (Signed)
Stable. Continuing lovastatin.

## 2015-07-17 NOTE — Assessment & Plan Note (Signed)
Unclear etiology. Followed by neurology. Negative MRI. Scheduled for follow up/EEG.

## 2015-07-17 NOTE — Assessment & Plan Note (Signed)
Stable. Advised to stop Xanax. Continuing Ativan and lexapro. Cautioned about use of Ativan in the elderly. I informed her that if her "spells" continue I would taper and discontinue Ativan.

## 2015-07-17 NOTE — Telephone Encounter (Signed)
Spoke to Kerri Carter - patient's EEG has been moved to 07/22/15 and her follow up with Dr. Krista Blue is now on 07/25/15.

## 2015-07-17 NOTE — Assessment & Plan Note (Signed)
Stable on Bystolic.

## 2015-07-17 NOTE — Assessment & Plan Note (Signed)
Stable on Trazodone.  ?

## 2015-07-22 ENCOUNTER — Ambulatory Visit (INDEPENDENT_AMBULATORY_CARE_PROVIDER_SITE_OTHER): Payer: Medicare Other | Admitting: Neurology

## 2015-07-22 DIAGNOSIS — R519 Headache, unspecified: Secondary | ICD-10-CM

## 2015-07-22 DIAGNOSIS — R410 Disorientation, unspecified: Secondary | ICD-10-CM

## 2015-07-22 DIAGNOSIS — R51 Headache: Secondary | ICD-10-CM

## 2015-07-22 DIAGNOSIS — F05 Delirium due to known physiological condition: Secondary | ICD-10-CM | POA: Diagnosis not present

## 2015-07-23 ENCOUNTER — Other Ambulatory Visit: Payer: Self-pay

## 2015-07-23 MED ORDER — NEBIVOLOL HCL 10 MG PO TABS: 10.0000 mg | ORAL_TABLET | Freq: Every day | ORAL | Status: DC

## 2015-07-24 NOTE — Procedures (Addendum)
   HISTORY: 75 years old female, presented with episodes of sudden onset of right-sided headaches, confusion since June 2017.  TECHNIQUE:  16 channel EEG was performed based on standard 10-16 international system. One channel was dedicated to EKG, which has demonstrates normal sinus rhythm of 66 beats per minutes.  Upon awakening, the posterior background activity mildly asymmetric, there was mixed and theta range activity at baseline, that was reactive to eye opening and closure, there was occasionally P4 sharp transient, more frequent T5 sharp transient. There was occasionally slower amplitude involving T5 leads.   During hyperventilation, there was short protrusion of bilateral frontal dominant transient high amplitude theta range activity, there was no evidence of epileptiform discharges.   Hyperventilation was performed, there was no abnormality elicit.  No sleep was achieved. Photic stimulation was not performed.  CONCLUSION: This is a  mild abnormal EEG.  There is no electrodiagnostic evidence of epileptiform discharge, there was occasionally mild dysarrhythmic slower higher amplitude activity, seems to be more frequent on the left side, with occasionally T5, P4 sharp transient. Above findings indicating mild generalized brain malfunction, more involving left hemisphere.

## 2015-07-25 ENCOUNTER — Encounter: Payer: Self-pay | Admitting: Neurology

## 2015-07-25 ENCOUNTER — Ambulatory Visit (INDEPENDENT_AMBULATORY_CARE_PROVIDER_SITE_OTHER): Payer: Medicare Other | Admitting: Neurology

## 2015-07-25 ENCOUNTER — Telehealth: Payer: Self-pay | Admitting: Family Medicine

## 2015-07-25 ENCOUNTER — Telehealth: Payer: Self-pay | Admitting: Neurology

## 2015-07-25 VITALS — BP 122/63 | HR 63 | Ht 64.0 in | Wt 147.0 lb

## 2015-07-25 DIAGNOSIS — I10 Essential (primary) hypertension: Secondary | ICD-10-CM | POA: Diagnosis not present

## 2015-07-25 DIAGNOSIS — F32A Depression, unspecified: Secondary | ICD-10-CM

## 2015-07-25 DIAGNOSIS — E11319 Type 2 diabetes mellitus with unspecified diabetic retinopathy without macular edema: Secondary | ICD-10-CM

## 2015-07-25 DIAGNOSIS — F419 Anxiety disorder, unspecified: Secondary | ICD-10-CM

## 2015-07-25 DIAGNOSIS — F418 Other specified anxiety disorders: Secondary | ICD-10-CM

## 2015-07-25 DIAGNOSIS — F809 Developmental disorder of speech and language, unspecified: Secondary | ICD-10-CM | POA: Diagnosis not present

## 2015-07-25 DIAGNOSIS — F329 Major depressive disorder, single episode, unspecified: Secondary | ICD-10-CM

## 2015-07-25 MED ORDER — LAMOTRIGINE 25 MG PO TABS: ORAL_TABLET | ORAL | Status: DC

## 2015-07-25 MED ORDER — LAMOTRIGINE 100 MG PO TABS
100.0000 mg | ORAL_TABLET | Freq: Two times a day (BID) | ORAL | Status: DC
Start: 2015-07-25 — End: 2015-09-30

## 2015-07-25 NOTE — Telephone Encounter (Signed)
Please advise 

## 2015-07-25 NOTE — Telephone Encounter (Signed)
Pt daughter Ronnye Casagrande left a vm regarding pt taking a number of medications that she's taking could possibly be causing seizures per the neurologist. One of the acid reducers medications could be doing that and the gabapentin (NEURONTIN) 300 MG capsule was reduced from 3 to 1. Daughter would like to speak Dr Lacinda Axon.  Call daughter @ [phone]. Thank you!

## 2015-07-25 NOTE — Telephone Encounter (Signed)
Spoke to Kerri Carter - she would like results mailed to her home address.  The final dictation for the EEG was just completed today, although Dr. Krista Blue has already reviewed it with her.  I also provided her the the mychart help desk number.

## 2015-07-25 NOTE — Telephone Encounter (Signed)
Patient called, was advised by Dr. Krista Blue today that she would be able to access EEG and MRI results through [place], patient is on MyChart now and still cannot pull up results. Please call to advise [phone].

## 2015-07-25 NOTE — Progress Notes (Signed)
Chief Complaint  Patient presents with  . Subacute Confusional State    She is here with her son, Kerri Carter.  They would like to review her EEG and MRI results.  Reports three additional events since last seen (07/13/15, 07/17/15, 07/22/15).      PATIENT: Kerri Carter DOB: 09/26/1940  Chief Complaint  Patient presents with  . Subacute Confusional State    She is here with her son, Kerri Carter.  They would like to review her EEG and MRI results.  Reports three additional events since last seen (07/13/15, 07/17/15, 07/22/15).     HISTORICAL  Kerri Carter is a 75 years old right-handed female, accompanied by her husband and daughter-in-law Kerri Carter, seen in refer by her primary care physician for evaluation of possible hydrocephalus in July 11 2015  I reviewed and summarized referring note, she had a history of asthma, acid reflux disease, peptic ulcer disease, had a history of GI bleeding in May 2014 due to anticoagulation treatment with Kerri Carter, she had DVT following her keep replacement. 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 right knee replacement in June 2015,  She reported two similar spells while she and her husband took on a RV trip at the end of May 2017.  In May 27th 2017, she had sudden onset right parietal area pain, nause, chill, could not move her body, lost control of bladder, mild confusions, lasting for few minutes.  She went to sleep afterwards.  Similar occurrence on June 2nd, she had sudden onset right side headache, could not get up baring weight, confused,   Laboratory evaluation in April 2017, normal CMP with elevated creatinine 1.2, CBC showed mild anemia, with hemoglobin 11 point 0,  She was take to local hospital for evaluation,   I personally reviewed imaging study from [facility] at [place] on June 2017, CAT scan of the brain showed mild generalized atrophy, periventricular small vessel disease, ventriculomegaly, MRA of the brain showed no large vessel disease  Ultrasound of carotid artery showed less than 50% stenosis at bilateral internal carotid artery.  Echocardiogram showed ejection fraction 55-60%, evidence of multiple valvular disease, bubble study show no evidence of PFO.  Chest x-ray showed no acute cardio pulmonary disease,  Around Jun 20 2015, she had a tick bite to her right knee, with erythematous around the blackhead, Lyme titer was checked during hospital admission, I do not know the report  UPDATE July 25 2015: She came in with her son at today's clinical visit, she was actually in her spells, I was able to witness staggering of the speech, tearing, normal comprehension, follow command, no loss of consciousness, no body jerking movement, complaint generalized weakness, seems to be under her volunteer control.   We have personally reviewed MRI of the brain in June 2017: Mild generalized atrophy, enlarged ventricle, asymmetry with enlarged right posterior ventricle,  EEG showed in June 2017 showed mild abnormality, There is no electrodiagnostic  evidence of epileptiform discharge, there was occasionally mild  dysarrhythmic slower higher amplitude activity, seems to be more  frequent on the left side, with occasionally T5, P4 sharp  transient. Above findings indicating mild generalized brain  malfunction, more involving left hemisphere.    She also reported similar spells in May 27, June second,  June 10, June 14, June 19.  REVIEW OF SYSTEMS: Full 14 system review of systems performed and notable only for chills, fatigue, trouble swallowing, eye itching, light sensitivity, cough, leg swelling, murmur, cold intolerance, excessive thirst, nausea,  insomnia, apnea, daytime sleepiness, snoring, incontinence of bladder, frequent urination, urgency, joint pain, joint swelling, back pain, achy muscles, walking difficulty, bruise easily, anemia, memory loss, dizziness, headaches, seizure, speech difficulty, weakness, agitation, confusion decreased concentration depression anxiety  ALLERGIES: Allergies  Allergen Reactions  . Ceftin [Cefuroxime Axetil] Anaphylaxis    REACTION: tongue and throat swell  . Lisinopril Anaphylaxis    REACTION: tongue and throat swelling (onset 10-10-09)  . Antihistamines, Chlorpheniramine-Type     REACTION: makes pt hyper  . Antivert [Meclizine Hcl]     REACTION: bladder will not empty  . Decongestant [Pseudoephedrine Hcl]     REACTION: makes pt hyper  . Morphine And Related Other (See Comments)    Per patient, low blood pressure issues, following morphine, that requires action to raise it back up.  . Sulfa Antibiotics     Face swelling  . Xarelto [Rivaroxaban]     GI bleed  . Adhesive [Tape] Rash  . Iodine Hives and Rash  . Levaquin [Levofloxacin In D5w] Rash  . Tetanus Toxoids     REACTION: rash, fever, hot to touch at injection site    HOME MEDICATIONS: Current Outpatient Prescriptions  Medication Sig Dispense Refill  . albuterol (VENTOLIN HFA) 108 (90 BASE) MCG/ACT inhaler Inhale 2 puffs into the lungs every 6 (six) hours as needed. 1 Inhaler 1  . aspirin 81 MG tablet Take 81 mg by mouth daily.    . benzonatate (TESSALON) 200 MG capsule Take 1 capsule (200 mg total) by mouth 3 (three) times daily as needed for cough. 30 capsule 1  . budesonide-formoterol (SYMBICORT) 160-4.5 MCG/ACT inhaler Inhale 2 puffs into the lungs 2 (two) times daily. (Patient taking differently: Inhale 2 puffs into the lungs 2 (two) times daily as needed. ) 1 Inhaler 2  . escitalopram (LEXAPRO) 5 MG tablet Take 5 mg by mouth daily.  0  . folic acid (FOLVITE) 1 MG tablet Take 1 mg by mouth daily.    . furosemide (LASIX) 20 MG  tablet Take 20 mg by mouth daily.     Marland Kitchen gabapentin (NEURONTIN) 300 MG capsule Take 1 capsule (300 mg total) by mouth 4 (four) times daily. 120 capsule 2  . HYDROcodone-acetaminophen (NORCO/VICODIN) 5-325 MG tablet Take 1 tablet by mouth every 6 (six) hours as needed for moderate pain. 30 tablet 0  . LORazepam (ATIVAN) 1 MG tablet Take 1 mg by mouth 2 (two) times daily.   0  . lovastatin (ALTOPREV) 20 MG 24 hr tablet Take 20 mg by mouth at bedtime.    . methotrexate 50 MG/2ML injection Inject into the skin once a week. Inject .38ml once weekly.    . montelukast (SINGULAIR) 10 MG tablet Take 10 mg by mouth daily.     . multivitamin-lutein (OCUVITE-LUTEIN) CAPS capsule Take 1 capsule by mouth 2 (two) times daily.    . nebivolol (BYSTOLIC) 10 MG tablet Take 1 tablet (10 mg total) by mouth daily. 30 tablet 3  . pantoprazole (PROTONIX) 40 MG tablet Take 40 mg by mouth 2 (two) times daily.     . potassium chloride (K-DUR,KLOR-CON) 10 MEQ tablet Take 10 mEq by mouth daily.     . ranitidine (ZANTAC) 150 MG tablet Take 150 mg by mouth 2 (two) times daily.    Marland Kitchen  traZODone (DESYREL) 100 MG tablet Take 200 mg by mouth at bedtime.     No current facility-administered medications for this visit.    PAST MEDICAL HISTORY: Past Medical History  Diagnosis Date  . HTN (hypertension)   . CKD (chronic kidney disease)   . Hyperlipidemia   . Rheumatoid aortitis (HCC)     hands, low titer rheumatoid factor, carpal tunnel  . Chicken pox   . COPD (chronic obstructive pulmonary disease) ([place])   . Toxic maculopathy   . DM type 2 (diabetes mellitus, type 2) ([place])     PAST SURGICAL HISTORY: Past Surgical History  Procedure Laterality Date  . Nephrectomy  1988    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  . Tubal ligation    . Appendectomy    . Tonsillectomy    . Cesarean section      x1  . Bunionectomy    . Total hip arthroplasty  12/10/11    [facility] left hip    FAMILY  HISTORY: Family History  Problem Relation Age of Onset  . Rheum arthritis Mother   . Asthma Mother   . Parkinson's disease Mother   . Heart disease Mother   . Stroke Mother   . Hypertension Mother   . Diabetes Son   . Asthma Sister   . Lung cancer Sister     was a smoker  . Breast cancer Sister   . Heart disease Sister   . Heart attack Father   . Heart disease Father   . Hypertension Father   . Diabetes Sister   . Stroke Sister   . Heart attack Sister   . Lung cancer Sister   . Heart disease Brother   . Alcohol abuse Brother   . Heart disease Maternal Grandmother   . Diabetes Maternal Grandmother   . Colon cancer Maternal Grandmother     SOCIAL HISTORY:  Social History   Social History  . Marital Status: Married    Spouse Name: N/A  . Number of Children: 2  . Years of Education: Some Coll   Occupational History  . Retired    Social History Main Topics  . Smoking status: Former Smoker -- 0.50 packs/day for 20 years    Types: Cigarettes    Quit date: 02/02/1974  . Smokeless tobacco: Never Used  . Alcohol Use: No  . Drug Use: No  . Sexual Activity: Not on file   Other Topics Concern  . Not on file   Social History Narrative   Lives at home with her husband and grandson.   Right-handed.   6 cups coffee per day.     PHYSICAL EXAM   Filed Vitals:   07/25/15 0841  BP: 122/63  Pulse: 63  Height: 5\' 4"  (1.626 m)  Weight: 147 lb (66.679 kg)    Not recorded      Body mass index is 25.22 kg/(m^2).  PHYSICAL EXAMNIATION:  Gen: NAD, conversant, well nourised, obese, well groomed                     Cardiovascular: Regular rate rhythm, no peripheral edema, warm, nontender. Eyes: Conjunctivae clear without exudates or hemorrhage Neck: Supple, no carotid bruise. Pulmonary: Clear to auscultation bilaterally   NEUROLOGICAL EXAM:  MENTAL STATUS: Speech:    Speech is normal; fluent and spontaneous with normal comprehension.  Cognition:      Orientation to time, place and person, she is not oriented to date  recent and remote memory: She missed 2/3 recall     Normal Attention span and concentration     Normal Language, naming, repeating,spontaneous speech     Fund of knowledge   CRANIAL NERVES: CN II: Visual fields are full to confrontation. Fundoscopic exam is normal with sharp discs and no vascular changes. Pupils are round equal and briskly reactive to light. CN III, IV, VI: extraocular movement are normal. No ptosis. CN V: Facial sensation is intact to pinprick in all 3 divisions bilaterally. Corneal responses are intact.  CN VII: Face is symmetric with normal eye closure and smile. CN VIII: Hearing is normal to rubbing fingers CN IX, X: Palate elevates symmetrically. Phonation is normal. CN XI: Head turning and shoulder shrug are intact CN XII: Tongue is midline with normal movements and no atrophy.  MOTOR: There is no pronator drift of out-stretched arms. Muscle bulk and tone are normal. Muscle strength is normal.  REFLEXES: Reflexes are 2+ and symmetric at the biceps, triceps, knees, and ankles. Plantar responses are flexor.  SENSORY: Intact to light touch, pinprick, positional sensation and vibratory sensation are intact in fingers and toes.  COORDINATION: Rapid alternating movements and fine finger movements are intact. There is no dysmetria on finger-to-nose and heel-knee-shin.    GAIT/STANCE: She need assistant to get off from seated position, antalgic, cautious, dragging her right leg, wearing a rigid right ankle brace   DIAGNOSTIC DATA (LABS, IMAGING, TESTING) - I reviewed patient records, labs, notes, testing and imaging myself where available.   ASSESSMENT AND PLAN  MCKENSI MARINER is a 75 y.o. female   Episodes of acute onset confusion, language difficulty  Differentiation diagnosis includes migraine, I was able to weakness one episode during today's clinical visit, not typical for seizure   she  does complains of depression anxiety, already on polypharmacy treatment,   Will try lamotrigine titrating 100 mg twice a day for her depression anxiety, possible conversion episode  Return to clinic in 3 months   Marcial Pacas, M.D. Ph.D.  [facility] [address] [place], [place] [address] Ph: [phone] Fax: [phone]  CC: Coral Spikes, DO

## 2015-07-26 NOTE — Telephone Encounter (Signed)
She just saw neurology. It doesn't appear that she is having seizure. We can talk about decreasing and stopping medications at her next visit.

## 2015-07-26 NOTE — Telephone Encounter (Signed)
Patients daughter has been notified. No questions, comment, or concerns

## 2015-07-31 ENCOUNTER — Telehealth: Payer: Self-pay | Admitting: Neurology

## 2015-07-31 NOTE — Telephone Encounter (Signed)
Pt's daughter-in-law called said since pt started the new medication (she does not know the name) the patient had a seizure on Sunday, Monday and Tuesday. Please call

## 2015-07-31 NOTE — Telephone Encounter (Signed)
Patient has just started Lamictal and is in the process of titrating her dose up to 100mg , BID.  Currently, she is only taking 25mg  BID.  Dr. Krista Blue is aware of the reported episodes and has instructed her to continue increasing her dose, as directed.  Spoke to [name] (daughter-in-law on [employer]) who verbalized understanding.

## 2015-08-01 ENCOUNTER — Other Ambulatory Visit: Payer: Self-pay

## 2015-08-01 ENCOUNTER — Telehealth: Payer: Self-pay | Admitting: *Deleted

## 2015-08-01 NOTE — Telephone Encounter (Signed)
I advise that she follow up with neurology. If she would like to see me sooner, I have appts available today and tomorrow.

## 2015-08-01 NOTE — Telephone Encounter (Signed)
Patient daughter in law requested a call to discuss patient health conditions and medication changes Please call [name] the daughter in law [phone]

## 2015-08-01 NOTE — Telephone Encounter (Signed)
No label that they are seizures, Lamictal started per neurology.   Had episodes on Sunday, Monday Tuesday and Wednesday.  Not on a therapeutic dose yet.   Daughter in law is concerned that some of current meds might be contributing to these episodes.  She wanted to know if you may want to see her sooner then next week? Please advise.

## 2015-08-01 NOTE — Telephone Encounter (Signed)
Spoke with Daughter in law, scheduled for tomorrow at 3pm, thanks

## 2015-08-02 ENCOUNTER — Ambulatory Visit (INDEPENDENT_AMBULATORY_CARE_PROVIDER_SITE_OTHER): Payer: Medicare Other | Admitting: Family Medicine

## 2015-08-02 ENCOUNTER — Encounter: Payer: Self-pay | Admitting: Family Medicine

## 2015-08-02 VITALS — BP 140/58 | HR 71 | Temp 98.9°F

## 2015-08-02 DIAGNOSIS — F809 Developmental disorder of speech and language, unspecified: Secondary | ICD-10-CM | POA: Diagnosis not present

## 2015-08-02 DIAGNOSIS — F449 Dissociative and conversion disorder, unspecified: Secondary | ICD-10-CM | POA: Diagnosis not present

## 2015-08-02 NOTE — Patient Instructions (Signed)
We will call with your referral.  Please have your son contact me if he has any questions or concerns.  Follow up in 1 month.   Take care  Dr. Lacinda Axon

## 2015-08-04 DIAGNOSIS — F449 Dissociative and conversion disorder, unspecified: Secondary | ICD-10-CM | POA: Insufficient documentation

## 2015-08-04 HISTORY — DX: Dissociative and conversion disorder, unspecified: F44.9

## 2015-08-04 NOTE — Assessment & Plan Note (Signed)
Continues to have frequent "episodes" of this. Appears to be secondary to conversion disorder.  Triggered by moving of granddaughter and grandson. Referring to psychiatry.

## 2015-08-04 NOTE — Progress Notes (Signed)
Subjective:  Patient ID: Kerri Carter, female    DOB: 1940-09-11  Age: 75 y.o. MRN: [id number]  CC: Follow up to discuss medications  HPI:  75 year old female with COPD, OSA, HTN, RA, DM-2 presents for follow up.  Patient's appointment today to discuss medications that may be contributing to her "episodes" of confusion/language difficulty.  I was called out of a room with another patient as she was experiencing a "episode" in the car and was unable to come into the office without assistance. When I arrived at the car, she was crying and hysterical. She was speaking like a child (Baby/toddler voice). Patient was able to open her eyes and follow commands. Patient refused to ambulate saying that her feet were stuck to the floor. CMA lift her and placed her in a wheelchair and she was transported into the office.  In the room, patient continued to have changes in speech and was very emotional. Patient continued to talk like a child. There is no apparent focal neurological deficit. Patient was able to comprehend and follow commands. After several minutes of talking and discussion, she returned to her normal self with normal speech. She did appear fatigued.    Patient was accompanied by her granddaughter. Patient states that she's had an episode every day this week. The granddaughter informed me that her grandson lives with her. He is now engaged and is moving out in the near future. This is likely stressor for the increase in these events. Further discussion revealed that her granddaughter moved away at the time of the beginning of these episodes.  Social Hx   Social History   Social History  . Marital Status: Married    Spouse Name: N/A  . Number of Children: 2  . Years of Education: Some Coll   Occupational History  . Retired    Social History Main Topics  . Smoking status: Former Smoker -- 0.50 packs/day for 20 years    Types: Cigarettes    Quit date: 02/02/1974  . Smokeless  tobacco: Never Used  . Alcohol Use: No  . Drug Use: No  . Sexual Activity: Not Asked   Other Topics Concern  . None   Social History Narrative   Lives at home with her husband and grandson.   Right-handed.   6 cups coffee per day.   Review of Systems  Constitutional: Positive for fatigue.  Neurological: Positive for speech difficulty.   Objective:  BP 140/58 mmHg  Pulse 71  Temp(Src) 98.9 F (37.2 C)  SpO2 97%  BP/Weight 08/02/2015 07/25/2015 [id number]  Systolic BP XX123456 [id number] 0000000  Diastolic BP 58 63 68  Wt. (Lbs) - 147 147.4  BMI - 25.22 25.29   Physical Exam  Constitutional:  NAD.  HENT:  Head: Normocephalic and atraumatic.  Pulmonary/Chest: Effort normal.  Neurological: She is alert.  No apparent neurologic deficit.  Psychiatric:  Depressed mood.  Vitals reviewed.  Lab Results  Component Value Date   WBC 7.8 07/13/2015   HGB 10.9* 07/13/2015   HCT 34.1* 07/13/2015   PLT 323 07/13/2015   GLUCOSE 109* 07/13/2015   ALT 15 07/13/2015   AST 20 07/13/2015   NA 139 07/13/2015   K 4.1 07/13/2015   CL 104 07/13/2015   CREATININE 1.28* 07/13/2015   BUN 19 07/13/2015   CO2 29 07/13/2015   INR 0.9 07/03/2013    Assessment & Plan:   Problem List Items Addressed This Visit    Language difficulty -  Primary    Continues to have frequent "episodes" of this. Appears to be secondary to conversion disorder.  Triggered by moving of granddaughter and grandson. Referring to psychiatry.      Conversion disorder    New problem. Continue lamictal. Referring to psychiatry.        Follow-up: Has follow up in 2 weeks.  30 minutes were spent face-to-face with the patient during this encounter and over half of that time was spent on counseling regarding nature of her episodes and diagnosis/treament of conversion disorder.   [place]

## 2015-08-04 NOTE — Assessment & Plan Note (Signed)
New problem. Continue lamictal. Referring to psychiatry.

## 2015-08-13 ENCOUNTER — Other Ambulatory Visit: Payer: Self-pay | Admitting: Family Medicine

## 2015-08-13 ENCOUNTER — Other Ambulatory Visit: Payer: Medicare Other

## 2015-08-13 NOTE — Telephone Encounter (Signed)
Historical medication. Please advise? 

## 2015-08-14 ENCOUNTER — Encounter: Payer: Self-pay | Admitting: Family Medicine

## 2015-08-14 ENCOUNTER — Ambulatory Visit (INDEPENDENT_AMBULATORY_CARE_PROVIDER_SITE_OTHER): Payer: Medicare Other | Admitting: Family Medicine

## 2015-08-14 ENCOUNTER — Other Ambulatory Visit: Payer: Self-pay | Admitting: Family Medicine

## 2015-08-14 VITALS — BP 112/62 | HR 67 | Temp 97.7°F | Wt 154.0 lb

## 2015-08-14 DIAGNOSIS — D649 Anemia, unspecified: Secondary | ICD-10-CM

## 2015-08-14 DIAGNOSIS — J432 Centrilobular emphysema: Secondary | ICD-10-CM | POA: Diagnosis not present

## 2015-08-14 DIAGNOSIS — F449 Dissociative and conversion disorder, unspecified: Secondary | ICD-10-CM

## 2015-08-14 DIAGNOSIS — I10 Essential (primary) hypertension: Secondary | ICD-10-CM | POA: Diagnosis not present

## 2015-08-14 LAB — CBC
HCT: 34.9 % — ABNORMAL LOW (ref 36.0–46.0)
Hemoglobin: 11.4 g/dL — ABNORMAL LOW (ref 12.0–15.0)
MCHC: 32.6 g/dL (ref 30.0–36.0)
MCV: 90 fl (ref 78.0–100.0)
Platelets: 267 10*3/uL (ref 150.0–400.0)
RBC: 3.87 Mil/uL (ref 3.87–5.11)
RDW: 18.8 % — ABNORMAL HIGH (ref 11.5–15.5)
WBC: 8.5 10*3/uL (ref 4.0–10.5)

## 2015-08-14 LAB — IBC PANEL
Iron: 200 ug/dL — ABNORMAL HIGH (ref 42–145)
Saturation Ratios: 54.9 % — ABNORMAL HIGH (ref 20.0–50.0)
Transferrin: 260 mg/dL (ref 212.0–360.0)

## 2015-08-14 LAB — FERRITIN: Ferritin: 42.4 ng/mL (ref 10.0–291.0)

## 2015-08-14 NOTE — Progress Notes (Signed)
Pre visit review using our clinic review tool, if applicable. No additional management support is needed unless otherwise documented below in the visit note. 

## 2015-08-14 NOTE — Patient Instructions (Signed)
Continue your current medications.  Follow up in 6 months (pending the labs).  Take care  Dr. Lacinda Axon

## 2015-08-15 DIAGNOSIS — D649 Anemia, unspecified: Secondary | ICD-10-CM | POA: Insufficient documentation

## 2015-08-15 DIAGNOSIS — D509 Iron deficiency anemia, unspecified: Secondary | ICD-10-CM

## 2015-08-15 HISTORY — DX: Iron deficiency anemia, unspecified: D50.9

## 2015-08-15 NOTE — Assessment & Plan Note (Signed)
Doing well at this time. She's had no further episodes. Is on Lamictal. Referring to psychiatry. Awaiting appointment.

## 2015-08-15 NOTE — Assessment & Plan Note (Signed)
Stable. Doing well on Symbicort and singulair. No issues currently.

## 2015-08-15 NOTE — Assessment & Plan Note (Signed)
New problem. Needs further workup. Labs today. See orders.

## 2015-08-15 NOTE — Assessment & Plan Note (Signed)
Well-controlled by Gay Filler. We'll continue.

## 2015-08-15 NOTE — Progress Notes (Signed)
Subjective:  Patient ID: Kerri Carter, female    DOB: [birth year]  Age: 76 y.o. MRN: [id number]  CC: Follow up  HPI:  75 year old female with a complicated past medical history including COPD, OSA, heart disease, hypertension, RA, DM 2, and conversion disorder presents for follow-up.  Conversion disorder  Patient has recently been experiencing "episodes" of headache, confusion, speech difficulty, inability to move.  Has seen neurology and has had a thorough workup. MRI negative. EEG negative for seizure. Thought to have conversion disorder. Started on Lamictal.  At her last visit, I witnessed one of the episodes. I agree with neurology that this is secondary to conversion disorder. She's had some recent life stressors. See my note on 6/30.  She presents today for follow-up.  She states that she has had no further "episodes" since our last visit and is doing well.  She is tolerating Lamictal without any issues.  HTN  Well controlled on Bystolic.  She does note recent dizziness.  COPD  Stable on Symbicort, singulair.  Anemia  Noted via review of labs.  Unclear etiology.  Needs further work up.  Social Hx   Social History   Social History  . Marital Status: Married    Spouse Name: N/A  . Number of Children: 2  . Years of Education: Some Coll   Occupational History  . Retired    Social History Main Topics  . Smoking status: Former Smoker -- 0.50 packs/day for 20 years    Types: Cigarettes    Quit date: 02/02/1974  . Smokeless tobacco: Never Used  . Alcohol Use: No  . Drug Use: No  . Sexual Activity: Not Asked   Other Topics Concern  . None   Social History Narrative   Lives at home with her husband and grandson.   Right-handed.   6 cups coffee per day.   Review of Systems  Constitutional: Negative.   Respiratory: Negative.   Cardiovascular: Negative.   Neurological: Positive for dizziness.   Objective:  BP 112/62 mmHg  Pulse 67  Temp(Src)  97.7 F (36.5 C) (Oral)  Wt 154 lb (69.854 kg)  SpO2 96%  BP/Weight 08/14/2015 08/02/2015 123456  Systolic BP XX123456 XX123456 [id number]  Diastolic BP 62 58 63  Wt. (Lbs) 154 - 147  BMI 26.42 - 25.22    Physical Exam  Constitutional: She is oriented to person, place, and time. She appears well-developed. No distress.  Cardiovascular: Normal rate and regular rhythm.   Pulmonary/Chest: Effort normal. She has no wheezes. She has no rales.  Neurological: She is alert and oriented to person, place, and time.  Psychiatric: She has a normal mood and affect.  Vitals reviewed.  Lab Results  Component Value Date   WBC 8.5 08/14/2015   HGB 11.4* 08/14/2015   HCT 34.9* 08/14/2015   PLT 267.0 08/14/2015   GLUCOSE 109* 07/13/2015   ALT 15 07/13/2015   AST 20 07/13/2015   NA 139 07/13/2015   K 4.1 07/13/2015   CL 104 07/13/2015   CREATININE 1.28* 07/13/2015   BUN 19 07/13/2015   CO2 29 07/13/2015   INR 0.9 07/03/2013    Assessment & Plan:   Problem List Items Addressed This Visit    COPD (chronic obstructive pulmonary disease) ([place])    Stable. Doing well on Symbicort and singulair. No issues currently.       Hypertension    Well-controlled by Gay Filler. We'll continue.      Conversion disorder - Primary  Doing well at this time. She's had no further episodes. Is on Lamictal. Referring to psychiatry. Awaiting appointment.      Anemia    New problem. Needs further workup. Labs today. See orders.      Relevant Orders   CBC (Completed)   Ferritin (Completed)   IBC panel (Completed)     Follow-up: 6 months  Dunn Loring DO [facility]

## 2015-08-20 ENCOUNTER — Ambulatory Visit: Payer: Medicare Other | Admitting: Neurology

## 2015-08-30 ENCOUNTER — Telehealth: Payer: Self-pay | Admitting: Family Medicine

## 2015-08-30 NOTE — Telephone Encounter (Signed)
I believe I put in a referral. Will you check on this for me.

## 2015-08-30 NOTE — Telephone Encounter (Signed)
LAst OV notes states Referral to Psych, but no referral in the chart, please advise, thanks

## 2015-08-30 NOTE — Telephone Encounter (Signed)
Pt son called to follow up on a referral to see the psychiatrist. Pt is worried if it takes to long she will not go. Please advise? I looked in chart and I did not see a referral.   Call son Bill @ [phone]. Thank you!

## 2015-09-02 ENCOUNTER — Encounter (HOSPITAL_COMMUNITY): Payer: Self-pay

## 2015-09-02 NOTE — Telephone Encounter (Signed)
Referral was placed and sent to [facility]. Once these are sent out they disappear from our view since it is behavioral health. I will call [facility] and follow up with her son.

## 2015-09-05 ENCOUNTER — Ambulatory Visit (INDEPENDENT_AMBULATORY_CARE_PROVIDER_SITE_OTHER): Payer: 59 | Admitting: Licensed Clinical Social Worker

## 2015-09-05 ENCOUNTER — Encounter: Payer: Self-pay | Admitting: Licensed Clinical Social Worker

## 2015-09-05 DIAGNOSIS — F418 Other specified anxiety disorders: Secondary | ICD-10-CM

## 2015-09-05 DIAGNOSIS — F32A Depression, unspecified: Secondary | ICD-10-CM

## 2015-09-05 DIAGNOSIS — F419 Anxiety disorder, unspecified: Secondary | ICD-10-CM

## 2015-09-05 DIAGNOSIS — F329 Major depressive disorder, single episode, unspecified: Secondary | ICD-10-CM

## 2015-09-05 NOTE — Progress Notes (Signed)
Comprehensive Clinical Assessment (CCA) Note  09/05/2015 Kerri Carter [id number]  Visit Diagnosis:      ICD-9-CM ICD-10-CM   1. Generalized anxiety disorder 300.02 F41.1   2. Major depressive disorder, recurrent episode, moderate (HCC) 296.32 F33.1       CCA Part One  Part One has been completed on paper by the patient.  (See scanned document in Chart Review)  CCA Part Two A  Intake/Chief Complaint:  CCA Intake With Chief Complaint CCA Part Two Date: 09/05/15 CCA Part Two Time: 1447 Chief Complaint/Presenting Problem: Husband took her in the hospital in May due to a "spell". Her head started hurting on the side, stuttering, lost ability to walk, her hands were like they were heavy weight, lost bladder control. Neurologist thought that she was having seizures, took at EEG and they did not see anything but put her on the seizure medicine. She hasn't had any episodes for six weeks when her dosage was 100 mg of Lamictal, she has these type of seizures when she went to see Cr. Lacinda Axon. He feels that she gets a lot of things in her mind and that she gets bogged down and it is a way to ask for help Patients Currently Reported Symptoms/Problems: She goes to do something and goes to the wrong spot. She can't do what she used to do, she thinks about things she has to do all the time and can't stop thinking about it. Grandson lives with them and is moving out next May and is getting married and this really bothers her. She raised them. She gets distracted and forgets what she was going to do.  Collateral Involvement: Richard, husband, notes from Dr. Lacinda Axon 08/02/15, 08/14/15 Individual's Strengths: she likes it when she can do her work and needs to do what she needs to do.  Individual's Preferences: she would like to be normal Individual's Abilities: cleaning and cooking Type of Services Patient Feels Are Needed: therapy, medication management Initial Clinical Notes/Concerns: Psychiatrist-no  history  Mental Health Symptoms Depression:  Depression: Change in energy/activity, Fatigue, Irritability, Sleep (too much or little), Tearfulness, Weight gain/loss (denies SI, denies SA, denies SIB)  Mania:  Mania: N/A  Anxiety:   Anxiety: Fatigue, Irritability, Restlessness, Sleep, Tension, Worrying (she gets paniky, i.e.worries about a negative outcome, excessively interferes with her functioning, if she drops things she gets aggravates her even though arthritis, she will break out in tears and thinks she can't do anything)  Psychosis:  Psychosis: N/A  Trauma:  Trauma: N/A  Obsessions:  Obsessions: N/A  Compulsions:  Compulsions: N/A (Hair around the house from pets and she has to vacuum and clean furniture because she can't stand the cat or dog hair. )  Inattention:  Inattention: N/A  Hyperactivity/Impulsivity:  Hyperactivity/Impulsivity: N/A  Oppositional/Defiant Behaviors:  Oppositional/Defiant Behaviors: N/A  Borderline Personality:  Emotional Irregularity: N/A  Other Mood/Personality Symptoms:      Mental Status Exam Appearance and self-care  Stature:  Stature: Small  Weight:  Weight: Overweight  Clothing:  Clothing: Casual  Grooming:  Grooming: Normal  Cosmetic use:  Cosmetic Use: None  Posture/gait:  Posture/Gait: Normal  Motor activity:  Motor Activity: Not Remarkable  Sensorium  Attention:  Attention: Normal  Concentration:  Concentration: Normal  Orientation:  Orientation: X5  Recall/memory:  Recall/Memory: Defective in short-term  Affect and Mood  Affect:  Affect: Appropriate  Mood:  Mood: Depressed, Anxious  Relating  Eye contact:  Eye Contact: Normal  Facial expression:  Facial Expression: Responsive  Attitude toward examiner:  Attitude Toward Examiner: Cooperative  Thought and Language  Speech flow: Speech Flow: Normal  Thought content:  Thought Content: Appropriate to mood and circumstances  Preoccupation:     Hallucinations:     Organization:      Transport planner of Knowledge:  Fund of Knowledge: Average  Intelligence:  Intelligence: Average  Abstraction:  Abstraction: Normal  Judgement:  Judgement: Fair  Art therapist:  Reality Testing: Realistic  Insight:  Insight: Fair  Decision Making:  Decision Making: Normal  Social Functioning  Social Maturity:  Social Maturity: Responsible  Social Judgement:  Social Judgement: Normal  Stress  Stressors:  Stressors:  (grandson, can't do what she used to do)  Coping Ability:  Coping Ability: Exhausted, English as a second language teacher Deficits:     Supports:      Family and Psychosocial History: Family history Marital status: Married Number of Years Married: 42 What types of issues is patient dealing with in the relationship?: no Are you sexually active?: No What is your sexual orientation?: heterosexual Has your sexual activity been affected by drugs, alcohol, medication, or emotional stress?: no Does patient have children?: Yes How many children?: 2 How is patient's relationship with their children?: 53-Billy, 45-Carl Dane-good, raised two grandchildren and close-Dane's children, 5 grandchildren, the two she raised are like her children  Childhood History:  Childhood History By whom was/is the patient raised?: Both parents Additional childhood history information: great Description of patient's relationship with caregiver when they were a child: mom, dad-good Patient's description of current relationship with people who raised him/her: passed How were you disciplined when you got in trouble as a child/adolescent?: wasn't disciplined, she respected her parents Does patient have siblings?: Yes Number of Siblings: 6 Description of patient's current relationship with siblings: She was the baby, they have passed, she got along with them, all passed except one Did patient suffer any verbal/emotional/physical/sexual abuse as a child?: No Did patient suffer from severe childhood  neglect?: No Has patient ever been sexually abused/assaulted/raped as an adolescent or adult?: No Was the patient ever a victim of a crime or a disaster?: No Witnessed domestic violence?: No Has patient been effected by domestic violence as an adult?: No  CCA Part Two B  Employment/Work Situation: Employment / Work Copywriter, 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 time patient has a held a job?: 16 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 Has patient ever been in the [employer]?: No Has patient ever served in combat?: No Did You Receive Any Psychiatric Treatment/Services While in Passenger transport manager?: No Are There Guns or Other Weapons in [place]?: Yes Types of Guns/Weapons: shotguns 22 gage, ruger  22-no bullets,  Are These Psychologist, educational?: Yes  Education: Education School Currently Attending: no Last Grade Completed: 12 Name of [place]: [place] Did Teacher, adult education From [employer]?: Yes Did Physicist, medical?: Yes What Type of College Degree Do you Have?: degree in nursing assistant, 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, shorthand, booking, took early childhood classes  Did You Have Any Special Interests In School?: math, English Did You Have An Individualized Education Program (IIEP): No Did You Have Any Difficulty At [employer]?: No  Religion: Religion/Spirituality Are You A Religious Person?: Yes What is Your Religious Affiliation?: Pentecostal How Might This Affect Treatment?: n/a  Leisure/Recreation: Leisure / Recreation Leisure and Hobbies: take a trip to [place] where they are from, go out to eat, spend time  with pastor, cooking, crotchet,   Exercise/Diet: Exercise/Diet Do You Exercise?: No (knee replacement, baker cyst and if removed it) Have You Gained or Lost A Significant Amount of Weight in the Past Six Months?: Yes-Gained Number of Pounds Gained: 30 Do You Follow a Special Diet?: Yes Type of Diet:  Tries to, doesn't eat regular salt, not one one for breakfast but eats because she has to have medicine,  Do You Have Any Trouble Sleeping?: Yes Explanation of Sleeping Difficulties: has a medicaiton but sometimes wakes up at 2-3 o'clock in the morning, and lays there and can't get back to sleep,   CCA Part Two C  Alcohol/Drug Use: Alcohol / Drug Use Pain Medications: see med list Prescriptions: see med list Over the Counter: see med list  History of alcohol / drug use?: No history of alcohol / drug abuse                      CCA Part Three  ASAM's:  Six Dimensions of Multidimensional Assessment  Dimension 1:  Acute Intoxication and/or Withdrawal Potential:     Dimension 2:  Biomedical Conditions and Complications:     Dimension 3:  Emotional, Behavioral, or Cognitive Conditions and Complications:     Dimension 4:  Readiness to Change:     Dimension 5:  Relapse, Continued use, or Continued Problem Potential:     Dimension 6:  Recovery/Living Environment:      Substance use Disorder (SUD)    Social Function:  Social Functioning Social Maturity: Responsible Social Judgement: Normal  Stress:  Stress Stressors:  (grandson, can't do what she used to do) Coping Ability: Exhausted, Overwhelmed Patient Takes Medications The Way The Doctor Instructed?: Yes Priority Risk: Low Acuity  Risk Assessment- Self-Harm Potential: Risk Assessment For Self-Harm Potential Thoughts of Self-Harm: No current thoughts Method: No plan Availability of Means: No access/NA Additional Information for Self-Harm Potential: Family History of Suicide Additional Comments for Self-Harm Potential: Brother committed suicide  Risk Assessment -Dangerous to Others Potential: Risk Assessment For Dangerous to Others Potential Method: No Plan Availability of Means: No access or NA Intent: Vague intent or NA  DSM5 Diagnoses: Patient Active Problem List   Diagnosis Date Noted  . Anemia 08/15/2015   . Conversion disorder 08/04/2015  . Anxiety and depression 07/17/2015  . Hyperlipidemia 07/17/2015  . DM type 2 (diabetes mellitus, type 2) ([place]) 07/17/2015  . 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 Macular degeneration 07/17/2015  . Insomnia 07/17/2015  . Solitary pulmonary nodule 12/05/2014  . Rheumatoid arthritis ([place]) 12/05/2014  . Hypertension 04/15/2012  . Shortness of breath 10/13/2011  . Valvular heart disease 10/13/2011  . COPD (chronic obstructive pulmonary disease) ([place]) 09/09/2011  . OSA (obstructive sleep apnea) 09/09/2011  . Nocturnal hypoxemia 09/09/2011    Patient Centered Plan: Patient is on the following Treatment Plan(s):  Anxiety and Depression  Recommendations for Services/Supports/Treatments: Recommendations for Services/Supports/Treatments Recommendations For Services/Supports/Treatments: Individual Therapy, Medication Management  Treatment Plan Summary: Patient is a 75 year old female who was diagnosed with Coversion Disorder and referred for psychiatric treatment by Dr. Lacinda Axon. Patient describes having had "spells" and relates symptoms of head hurting on the side, stuttering, losing ability to walk, her hands felt like they were heavy weight, and loss of bladder control.(Per notes of Dr. Lacinda Axon, patient additionally experienced headache, confusion, inability to move). Her symptoms are complicated with past medical history including COPD, OSA, heart disease, hypertension, history of kidney problems, RA,  and DM2. Patient went to see a neurologist who took at EEG, per patient did not see anything but put her on seizure medication. Patient relates for the past 6 weeks since on 100 Lamictal she has not experienced any further episodes. She reports severe anxiety (GAD-7)=18 and depression (PH-Q-9=12).  Patient relates that she can't do what she used to do, she thinks about things she has to do all the time and can't stop thinking about it.It bothers  her that her grandson who she raised plans to move out when he gets married next May. She denies SI, past SI, SIB or history of SA and this is her first psychiatric experience. Patient is recommended for psychiatric evaluation as well as individual therapy to help her in developing coping strategies to manage stressors, to manage mood and supportive interventions.      Referrals to Alternative Service(s): Referred to Alternative Service(s):   Place:   Date:   Time:    Referred to Alternative Service(s):   Place:   Date:   Time:    Referred to Alternative Service(s):   Place:   Date:   Time:    Referred to Alternative Service(s):   Place:   Date:   Time:     Bowman,Mary A

## 2015-09-08 ENCOUNTER — Other Ambulatory Visit: Payer: Self-pay | Admitting: Family Medicine

## 2015-09-09 NOTE — Telephone Encounter (Signed)
Historical medications. Potassium was 4.1 last time it was checked. Please advise?

## 2015-09-13 ENCOUNTER — Telehealth: Payer: Self-pay | Admitting: Family Medicine

## 2015-09-13 NOTE — Telephone Encounter (Signed)
Will repeat in 3 months at follow up.

## 2015-09-13 NOTE — Telephone Encounter (Signed)
Needing orders to recheck hemaglobin.

## 2015-09-13 NOTE — Telephone Encounter (Signed)
Previous lab 08/14/15. PCP stated next repeat should be 3 months from 08/16/15 when labs were resulted. Will send to PCP to consult.

## 2015-09-26 ENCOUNTER — Observation Stay
Admission: EM | Admit: 2015-09-26 | Discharge: 2015-09-27 | Disposition: A | Discharge status: Home / Self Care | Payer: Medicare Other | Attending: Internal Medicine | Admitting: Internal Medicine

## 2015-09-26 ENCOUNTER — Emergency Department: Payer: Medicare Other

## 2015-09-26 ENCOUNTER — Encounter: Payer: Self-pay | Admitting: *Deleted

## 2015-09-26 DIAGNOSIS — F329 Major depressive disorder, single episode, unspecified: Secondary | ICD-10-CM | POA: Insufficient documentation

## 2015-09-26 DIAGNOSIS — K219 Gastro-esophageal reflux disease without esophagitis: Secondary | ICD-10-CM | POA: Insufficient documentation

## 2015-09-26 DIAGNOSIS — Z91048 Other nonmedicinal substance allergy status: Secondary | ICD-10-CM | POA: Insufficient documentation

## 2015-09-26 DIAGNOSIS — Z800 Family history of malignant neoplasm of digestive organs: Secondary | ICD-10-CM | POA: Insufficient documentation

## 2015-09-26 DIAGNOSIS — I10 Essential (primary) hypertension: Secondary | ICD-10-CM | POA: Diagnosis present

## 2015-09-26 DIAGNOSIS — Z980 Intestinal bypass and anastomosis status: Secondary | ICD-10-CM | POA: Insufficient documentation

## 2015-09-26 DIAGNOSIS — Z888 Allergy status to other drugs, medicaments and biological substances status: Secondary | ICD-10-CM | POA: Insufficient documentation

## 2015-09-26 DIAGNOSIS — I700 Atherosclerosis of aorta: Secondary | ICD-10-CM | POA: Diagnosis not present

## 2015-09-26 DIAGNOSIS — F419 Anxiety disorder, unspecified: Secondary | ICD-10-CM | POA: Diagnosis not present

## 2015-09-26 DIAGNOSIS — Z825 Family history of asthma and other chronic lower respiratory diseases: Secondary | ICD-10-CM | POA: Insufficient documentation

## 2015-09-26 DIAGNOSIS — E1122 Type 2 diabetes mellitus with diabetic chronic kidney disease: Secondary | ICD-10-CM | POA: Diagnosis not present

## 2015-09-26 DIAGNOSIS — M40204 Unspecified kyphosis, thoracic region: Secondary | ICD-10-CM | POA: Diagnosis not present

## 2015-09-26 DIAGNOSIS — Z87891 Personal history of nicotine dependence: Secondary | ICD-10-CM | POA: Insufficient documentation

## 2015-09-26 DIAGNOSIS — Z885 Allergy status to narcotic agent status: Secondary | ICD-10-CM | POA: Insufficient documentation

## 2015-09-26 DIAGNOSIS — Z79899 Other long term (current) drug therapy: Secondary | ICD-10-CM | POA: Diagnosis not present

## 2015-09-26 DIAGNOSIS — N183 Chronic kidney disease, stage 3 (moderate): Secondary | ICD-10-CM | POA: Diagnosis not present

## 2015-09-26 DIAGNOSIS — J449 Chronic obstructive pulmonary disease, unspecified: Secondary | ICD-10-CM | POA: Insufficient documentation

## 2015-09-26 DIAGNOSIS — M06842 Other specified rheumatoid arthritis, left hand: Secondary | ICD-10-CM | POA: Insufficient documentation

## 2015-09-26 DIAGNOSIS — R079 Chest pain, unspecified: Principal | ICD-10-CM | POA: Diagnosis present

## 2015-09-26 DIAGNOSIS — K449 Diaphragmatic hernia without obstruction or gangrene: Secondary | ICD-10-CM | POA: Diagnosis not present

## 2015-09-26 DIAGNOSIS — E785 Hyperlipidemia, unspecified: Secondary | ICD-10-CM | POA: Diagnosis not present

## 2015-09-26 DIAGNOSIS — I129 Hypertensive chronic kidney disease with stage 1 through stage 4 chronic kidney disease, or unspecified chronic kidney disease: Secondary | ICD-10-CM | POA: Diagnosis not present

## 2015-09-26 DIAGNOSIS — F449 Dissociative and conversion disorder, unspecified: Secondary | ICD-10-CM | POA: Diagnosis not present

## 2015-09-26 DIAGNOSIS — N1832 Chronic kidney disease, stage 3b: Secondary | ICD-10-CM | POA: Diagnosis present

## 2015-09-26 DIAGNOSIS — Z887 Allergy status to serum and vaccine status: Secondary | ICD-10-CM | POA: Insufficient documentation

## 2015-09-26 DIAGNOSIS — I200 Unstable angina: Secondary | ICD-10-CM

## 2015-09-26 DIAGNOSIS — Z881 Allergy status to other antibiotic agents status: Secondary | ICD-10-CM | POA: Insufficient documentation

## 2015-09-26 DIAGNOSIS — M06841 Other specified rheumatoid arthritis, right hand: Secondary | ICD-10-CM | POA: Diagnosis not present

## 2015-09-26 DIAGNOSIS — Z8711 Personal history of peptic ulcer disease: Secondary | ICD-10-CM | POA: Insufficient documentation

## 2015-09-26 DIAGNOSIS — J441 Chronic obstructive pulmonary disease with (acute) exacerbation: Secondary | ICD-10-CM | POA: Diagnosis present

## 2015-09-26 DIAGNOSIS — K8020 Calculus of gallbladder without cholecystitis without obstruction: Secondary | ICD-10-CM | POA: Insufficient documentation

## 2015-09-26 DIAGNOSIS — Z905 Acquired absence of kidney: Secondary | ICD-10-CM | POA: Insufficient documentation

## 2015-09-26 DIAGNOSIS — Z7982 Long term (current) use of aspirin: Secondary | ICD-10-CM | POA: Diagnosis not present

## 2015-09-26 DIAGNOSIS — Z801 Family history of malignant neoplasm of trachea, bronchus and lung: Secondary | ICD-10-CM | POA: Insufficient documentation

## 2015-09-26 DIAGNOSIS — Z8261 Family history of arthritis: Secondary | ICD-10-CM | POA: Insufficient documentation

## 2015-09-26 DIAGNOSIS — I2510 Atherosclerotic heart disease of native coronary artery without angina pectoris: Secondary | ICD-10-CM | POA: Insufficient documentation

## 2015-09-26 DIAGNOSIS — Z811 Family history of alcohol abuse and dependence: Secondary | ICD-10-CM | POA: Insufficient documentation

## 2015-09-26 DIAGNOSIS — M064 Inflammatory polyarthropathy: Secondary | ICD-10-CM | POA: Diagnosis not present

## 2015-09-26 DIAGNOSIS — Z87892 Personal history of anaphylaxis: Secondary | ICD-10-CM | POA: Insufficient documentation

## 2015-09-26 DIAGNOSIS — Z8249 Family history of ischemic heart disease and other diseases of the circulatory system: Secondary | ICD-10-CM | POA: Insufficient documentation

## 2015-09-26 DIAGNOSIS — Z803 Family history of malignant neoplasm of breast: Secondary | ICD-10-CM | POA: Insufficient documentation

## 2015-09-26 DIAGNOSIS — I340 Nonrheumatic mitral (valve) insufficiency: Secondary | ICD-10-CM | POA: Insufficient documentation

## 2015-09-26 HISTORY — DX: Unspecified osteoarthritis, unspecified site: M19.90

## 2015-09-26 HISTORY — DX: Major depressive disorder, single episode, unspecified: F32.9

## 2015-09-26 HISTORY — DX: Other specified arthritis, unspecified site: M13.80

## 2015-09-26 HISTORY — DX: Gastro-esophageal reflux disease without esophagitis: K21.9

## 2015-09-26 HISTORY — DX: Essential (primary) hypertension: I10

## 2015-09-26 HISTORY — DX: Depression, unspecified: F32.A

## 2015-09-26 HISTORY — DX: Atherosclerotic heart disease of native coronary artery without angina pectoris: I25.10

## 2015-09-26 HISTORY — DX: Endocarditis, valve unspecified: I38

## 2015-09-26 HISTORY — DX: Peptic ulcer, site unspecified, unspecified as acute or chronic, without hemorrhage or perforation: K27.9

## 2015-09-26 HISTORY — DX: Chronic kidney disease, stage 3 (moderate): N18.3

## 2015-09-26 HISTORY — DX: Anxiety disorder, unspecified: F41.9

## 2015-09-26 HISTORY — DX: Chronic kidney disease, stage 3 unspecified: N18.30

## 2015-09-26 HISTORY — DX: Dissociative and conversion disorder, unspecified: F44.9

## 2015-09-26 LAB — COMPREHENSIVE METABOLIC PANEL
ALT: 15 U/L (ref 14–54)
AST: 24 U/L (ref 15–41)
Albumin: 3.9 g/dL (ref 3.5–5.0)
Alkaline Phosphatase: 78 U/L (ref 38–126)
Anion gap: 9 (ref 5–15)
BUN: 20 mg/dL (ref 6–20)
CO2: 29 mmol/L (ref 22–32)
Calcium: 8.9 mg/dL (ref 8.9–10.3)
Chloride: 101 mmol/L (ref 101–111)
Creatinine, Ser: 1.41 mg/dL — ABNORMAL HIGH (ref 0.44–1.00)
GFR calc Af Amer: 42 mL/min — ABNORMAL LOW (ref >60)
GFR calc non Af Amer: 36 mL/min — ABNORMAL LOW (ref >60)
Glucose, Bld: 129 mg/dL — ABNORMAL HIGH (ref 65–99)
Potassium: 3.7 mmol/L (ref 3.5–5.1)
Sodium: 139 mmol/L (ref 135–145)
Total Bilirubin: 0.8 mg/dL (ref 0.3–1.2)
Total Protein: 6.8 g/dL (ref 6.5–8.1)

## 2015-09-26 LAB — CBC WITH DIFFERENTIAL/PLATELET
Basophils Absolute: 0.1 10*3/uL (ref 0–0.1)
Basophils Relative: 1 %
Eosinophils Absolute: 0.2 10*3/uL (ref 0–0.7)
Eosinophils Relative: 3 %
HCT: 34.9 % — ABNORMAL LOW (ref 35.0–47.0)
Hemoglobin: 11.7 g/dL — ABNORMAL LOW (ref 12.0–16.0)
Lymphocytes Relative: 32 %
Lymphs Abs: 2.7 10*3/uL (ref 1.0–3.6)
MCH: 30.1 pg (ref 26.0–34.0)
MCHC: 33.5 g/dL (ref 32.0–36.0)
MCV: 89.9 fL (ref 80.0–100.0)
Monocytes Absolute: 0.2 10*3/uL (ref 0.2–0.9)
Monocytes Relative: 2 %
Neutro Abs: 5.5 10*3/uL (ref 1.4–6.5)
Neutrophils Relative %: 62 %
Platelets: 241 10*3/uL (ref 150–440)
RBC: 3.88 MIL/uL (ref 3.80–5.20)
RDW: 17.1 % — ABNORMAL HIGH (ref 11.5–14.5)
WBC: 8.7 10*3/uL (ref 3.6–11.0)

## 2015-09-26 LAB — TROPONIN I
Troponin I: <0.03 ng/mL (ref <0.03)
Troponin I: 0.05 ng/mL (ref <0.03)

## 2015-09-26 LAB — GLUCOSE, CAPILLARY: Glucose-Capillary: 100 mg/dL — ABNORMAL HIGH (ref 65–99)

## 2015-09-26 LAB — LIPASE, BLOOD: Lipase: 50 U/L (ref 11–51)

## 2015-09-26 MED ORDER — ESCITALOPRAM OXALATE 10 MG PO TABS
5.0000 mg | ORAL_TABLET | Freq: Every day | ORAL | Status: DC
  Administered 2015-09-27: 5 mg via ORAL
  Filled 2015-09-26: qty 1

## 2015-09-26 MED ORDER — ACETAMINOPHEN 650 MG RE SUPP: 650.0000 mg | Freq: Four times a day (QID) | RECTAL | Status: DC | PRN

## 2015-09-26 MED ORDER — LOVASTATIN ER 20 MG PO TB24: 20.0000 mg | ORAL_TABLET | Freq: Every day | ORAL | Status: DC

## 2015-09-26 MED ORDER — ASPIRIN 81 MG PO CHEW
81.0000 mg | CHEWABLE_TABLET | Freq: Every day | ORAL | Status: DC
  Filled 2015-09-26: qty 1

## 2015-09-26 MED ORDER — INSULIN ASPART 100 UNIT/ML ~~LOC~~ SOLN: 0.0000 [IU] | Freq: Three times a day (TID) | SUBCUTANEOUS | Status: DC

## 2015-09-26 MED ORDER — SODIUM CHLORIDE 0.9 % IV BOLUS (SEPSIS)
1000.0000 mL | Freq: Once | INTRAVENOUS | Status: AC
  Administered 2015-09-26: 1000 mL via INTRAVENOUS

## 2015-09-26 MED ORDER — HYDROMORPHONE HCL 1 MG/ML IJ SOLN
1.0000 mg | Freq: Once | INTRAMUSCULAR | Status: DC
  Filled 2015-09-26: qty 1

## 2015-09-26 MED ORDER — ONDANSETRON HCL 4 MG/2ML IJ SOLN
4.0000 mg | Freq: Once | INTRAMUSCULAR | Status: AC
  Administered 2015-09-26: 4 mg via INTRAVENOUS
  Filled 2015-09-26: qty 2

## 2015-09-26 MED ORDER — MONTELUKAST SODIUM 10 MG PO TABS
10.0000 mg | ORAL_TABLET | Freq: Every day | ORAL | Status: DC
  Filled 2015-09-26: qty 1

## 2015-09-26 MED ORDER — FENTANYL CITRATE (PF) 100 MCG/2ML IJ SOLN
25.0000 ug | Freq: Once | INTRAMUSCULAR | Status: AC
  Administered 2015-09-26: 25 ug via INTRAVENOUS
  Filled 2015-09-26: qty 2

## 2015-09-26 MED ORDER — ENOXAPARIN SODIUM 40 MG/0.4ML ~~LOC~~ SOLN: 40.0000 mg | SUBCUTANEOUS | Status: DC

## 2015-09-26 MED ORDER — HYDROCODONE-ACETAMINOPHEN 5-325 MG PO TABS
1.0000 | ORAL_TABLET | Freq: Four times a day (QID) | ORAL | Status: DC | PRN
  Administered 2015-09-27: 1 via ORAL
  Filled 2015-09-26: qty 1

## 2015-09-26 MED ORDER — PRAVASTATIN SODIUM 20 MG PO TABS: 20.0000 mg | ORAL_TABLET | Freq: Every day | ORAL | Status: DC

## 2015-09-26 MED ORDER — GABAPENTIN 300 MG PO CAPS
300.0000 mg | ORAL_CAPSULE | Freq: Three times a day (TID) | ORAL | Status: DC
  Administered 2015-09-27: 300 mg via ORAL
  Filled 2015-09-26 (×2): qty 1

## 2015-09-26 MED ORDER — FUROSEMIDE 40 MG PO TABS: 20.0000 mg | ORAL_TABLET | Freq: Every day | ORAL | Status: DC

## 2015-09-26 MED ORDER — ONDANSETRON HCL 4 MG/2ML IJ SOLN: 4.0000 mg | Freq: Four times a day (QID) | INTRAMUSCULAR | Status: DC | PRN

## 2015-09-26 MED ORDER — BARIUM SULFATE 2.1 % PO SUSP
450.0000 mL | ORAL | Status: DC
  Administered 2015-09-26: 450 mL via ORAL

## 2015-09-26 MED ORDER — ONDANSETRON HCL 4 MG PO TABS: 4.0000 mg | ORAL_TABLET | Freq: Four times a day (QID) | ORAL | Status: DC | PRN

## 2015-09-26 MED ORDER — ACETAMINOPHEN 325 MG PO TABS: 650.0000 mg | ORAL_TABLET | Freq: Four times a day (QID) | ORAL | Status: DC | PRN

## 2015-09-26 MED ORDER — NEBIVOLOL HCL 5 MG PO TABS
5.0000 mg | ORAL_TABLET | Freq: Every day | ORAL | Status: DC
  Filled 2015-09-26: qty 1

## 2015-09-26 MED ORDER — FAMOTIDINE 20 MG PO TABS
20.0000 mg | ORAL_TABLET | Freq: Two times a day (BID) | ORAL | Status: DC
  Administered 2015-09-27: 20 mg via ORAL
  Filled 2015-09-26: qty 1

## 2015-09-26 MED ORDER — OCUVITE-LUTEIN PO CAPS
1.0000 | ORAL_CAPSULE | Freq: Two times a day (BID) | ORAL | Status: DC
  Filled 2015-09-26 (×2): qty 1

## 2015-09-26 MED ORDER — ASPIRIN EC 325 MG PO TBEC
325.0000 mg | DELAYED_RELEASE_TABLET | Freq: Once | ORAL | Status: AC
  Administered 2015-09-26: 325 mg via ORAL
  Filled 2015-09-26: qty 1

## 2015-09-26 MED ORDER — LAMOTRIGINE 100 MG PO TABS
100.0000 mg | ORAL_TABLET | Freq: Two times a day (BID) | ORAL | Status: DC
  Administered 2015-09-27: 100 mg via ORAL
  Filled 2015-09-26 (×4): qty 1

## 2015-09-26 MED ORDER — PANTOPRAZOLE SODIUM 40 MG PO TBEC
40.0000 mg | DELAYED_RELEASE_TABLET | Freq: Two times a day (BID) | ORAL | Status: DC
  Administered 2015-09-27: 40 mg via ORAL
  Filled 2015-09-26 (×2): qty 1

## 2015-09-26 MED ORDER — SODIUM CHLORIDE 0.9% FLUSH
3.0000 mL | Freq: Two times a day (BID) | INTRAVENOUS | Status: DC
  Administered 2015-09-27: 3 mL via INTRAVENOUS

## 2015-09-26 MED ORDER — INSULIN ASPART 100 UNIT/ML ~~LOC~~ SOLN: 0.0000 [IU] | Freq: Every day | SUBCUTANEOUS | Status: DC

## 2015-09-26 MED ORDER — TRAZODONE HCL 100 MG PO TABS
200.0000 mg | ORAL_TABLET | Freq: Every day | ORAL | Status: DC
  Administered 2015-09-27: 200 mg via ORAL
  Filled 2015-09-26: qty 2

## 2015-09-26 NOTE — ED Notes (Signed)
Pt placed on 2L [place], pt 02 sat 85% on RA while sleeping, pt states she wears CPAP at night

## 2015-09-26 NOTE — ED Notes (Signed)
EDP at bedside  

## 2015-09-26 NOTE — ED Notes (Signed)
Patient transported to X-ray 

## 2015-09-26 NOTE — ED Notes (Signed)
Pt resting in bed, eyes closed, resp even and unlabored

## 2015-09-26 NOTE — ED Triage Notes (Signed)
Pt arrives via EMS from home, complains of chest pain that began at 1400, states pain is continuous in nature, EMS reports giving 324 ASA and 1 episode of vomiting, pt arrives with eyes closed, not speaking very much, states nausea, holding her chest

## 2015-09-26 NOTE — ED Provider Notes (Signed)
[facility] Provider Note   CSN: [id number] Arrival date & time: 09/26/15  1541     History   Chief Complaint Chief Complaint  Patient presents with  . Chest Pain    HPI Kerri Carter is a 75 y.o. female hx of COPD, DM, HTN, HL here with Chest pain, abdominal pain. Patient states that she had acute onset of chest pain and epigastric pain starting around 2 PM. Patient states that it's continuous and is associated with one episode of vomiting. Denies any radiation to the pain. Denies any urinary symptoms. Denies fevers or chills. No known CAD history. She states that she has conversion disorder.   The history is provided by the patient.    Past Medical History:  Diagnosis Date  . Aortic insufficiency   . Chicken pox   . CKD (chronic kidney disease)   . COPD (chronic obstructive pulmonary disease) ([place])   . DM type 2 (diabetes mellitus, type 2) ([place])   . HTN (hypertension)   . Hyperlipidemia   . Mitral regurgitation   . 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 Toxic maculopathy     Patient Active Problem List   Diagnosis Date Noted  . Anemia 08/15/2015  . Conversion disorder 08/04/2015  . Anxiety and depression 07/17/2015  . Hyperlipidemia 07/17/2015  . DM type 2 (diabetes mellitus, type 2) ([place]) 07/17/2015  . 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 Macular degeneration 07/17/2015  . Insomnia 07/17/2015  . Solitary pulmonary nodule 12/05/2014  . Rheumatoid arthritis ([place]) 12/05/2014  . Hypertension 04/15/2012  . Shortness of breath 10/13/2011  . Valvular heart disease 10/13/2011  . COPD (chronic obstructive pulmonary disease) ([place]) 09/09/2011  . OSA (obstructive sleep apnea) 09/09/2011  . Nocturnal hypoxemia 09/09/2011    Past Surgical History:  Procedure Laterality Date  . APPENDECTOMY    . BUNIONECTOMY    . CESAREAN SECTION     x1  . 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 TONSILLECTOMY    . TOTAL HIP ARTHROPLASTY  12/10/11   [facility] left hip  . TUBAL LIGATION      OB History    No data available       Home Medications    Prior to Admission medications   Medication Sig Start Date End Date Taking? Authorizing Provider  albuterol (VENTOLIN HFA) 108 (90 BASE) MCG/ACT inhaler Inhale 2 puffs into the lungs every 6 (six) hours as needed. 04/14/12   Tanda Rockers, MD  aspirin 81 MG tablet Take 81 mg by mouth daily.    Historical Provider, MD  benzonatate (TESSALON) 200 MG capsule Take 1 capsule (200 mg total) by mouth 3 (three) times daily as needed for cough. 04/17/15   Chesley Mires, MD  budesonide-formoterol (SYMBICORT) 160-4.5 MCG/ACT inhaler Inhale 2 puffs into the lungs 2 (two) times daily. Patient taking differently: Inhale 2 puffs into the lungs 2 (two) times daily as needed.  09/06/12   Juanito Doom, MD  escitalopram (LEXAPRO) 5 MG tablet Take 5 mg by mouth daily. 04/24/15   Historical Provider, MD  folic acid (FOLVITE) 1 MG tablet Take 1 mg by mouth daily.    Historical Provider, MD  furosemide (LASIX) 20 MG tablet take 1 tablet by mouth once daily 09/09/15   Coral Spikes, DO  gabapentin (NEURONTIN) 300 MG capsule Take 1 capsule (300 mg total)  by mouth 4 (four) times daily. Patient taking differently: Take 300 mg by mouth 3 (three) times daily.  07/13/15   Shela Leff, MD  HYDROcodone-acetaminophen (NORCO/VICODIN) 5-325 MG tablet Take 1 tablet by mouth every 6 (six) hours as needed for moderate pain. 01/15/15   Landis Martins, DPM  lamoTRIgine (LAMICTAL) 100 MG tablet Take 1 tablet (100 mg total) by mouth 2 (two) times daily. 07/25/15   Marcial Pacas, MD  lamoTRIgine (LAMICTAL) 25 MG tablet 1 tablet twice a day for the first week 2 tablets twice a day for the second week 3 tablets twice a day for the third week 4 tablets twice a day for the fourth week  After finish titration with small dose of  lamotrigine 25 mg, change to lamotrigine 100 mg twice a day Patient not taking: Reported on 09/05/2015 07/25/15   Marcial Pacas, MD  LORazepam (ATIVAN) 1 MG tablet Take 1 mg by mouth 1 day or 1 dose. Patient taking once in the morning 06/20/15   Historical Provider, MD  lovastatin (ALTOPREV) 20 MG 24 hr tablet Take 20 mg by mouth at bedtime.    Historical Provider, MD  methotrexate 50 MG/2ML injection Inject into the skin once a week. Inject 10 ml once weekly. 09/27/14   Historical Provider, MD  montelukast (SINGULAIR) 10 MG tablet take 1 tablet by mouth once daily 08/13/15   Coral Spikes, DO  multivitamin-lutein (OCUVITE-LUTEIN) CAPS capsule Take 1 capsule by mouth 2 (two) times daily.    Historical Provider, MD  nebivolol (BYSTOLIC) 10 MG tablet Take 1 tablet (10 mg total) by mouth daily. 07/23/15   Juanito Doom, MD  pantoprazole (PROTONIX) 40 MG tablet Take 40 mg by mouth 2 (two) times daily.     Historical Provider, MD  potassium chloride (K-DUR) 10 MEQ tablet take 1 tablet by mouth once daily 09/09/15   Coral Spikes, DO  ranitidine (ZANTAC) 150 MG tablet Take 150 mg by mouth 2 (two) times daily.    Historical Provider, MD  traZODone (DESYREL) 100 MG tablet Take 200 mg by mouth at bedtime.    Historical Provider, MD    Family History Family History  Problem Relation Age of Onset  . Rheum arthritis Mother   . Asthma Mother   . Parkinson's disease Mother   . Heart disease Mother   . Stroke Mother   . Hypertension Mother   . Heart attack Father   . Heart disease Father   . Hypertension Father   . Diabetes Son   . Asthma Sister   . Lung cancer Sister     was a smoker  . Breast cancer Sister   . Heart disease Sister   . Diabetes Sister   . Stroke Sister   . Heart attack Sister   . Lung cancer Sister   . Heart disease Brother   . Alcohol abuse Brother   . Heart disease Maternal Grandmother   . Diabetes Maternal Grandmother   . Colon cancer Maternal Grandmother     Social  History Social History  Substance Use Topics  . Smoking status: Former Smoker    Packs/day: 0.50    Years: 20.00    Types: Cigarettes    Quit date: 02/02/1974  . Smokeless tobacco: Never Used  . Alcohol use No     Allergies   Ceftin [cefuroxime axetil]; Lisinopril; Antihistamines, chlorpheniramine-type; Antivert [meclizine hcl]; Decongestant [pseudoephedrine hcl]; Doxycycline; Morphine and related; Sulfa antibiotics; Xarelto [rivaroxaban]; Adhesive [tape]; Iodine; Levaquin [levofloxacin in  d5w]; and Tetanus toxoids   Review of Systems Review of Systems  Cardiovascular: Positive for chest pain.  All other systems reviewed and are negative.    Physical Exam Updated Vital Signs BP (!) 126/49   Pulse 61   Resp 18   Ht 5\' 4"  (1.626 m)   Wt 150 lb (68 kg)   SpO2 97%   BMI 25.75 kg/m   Physical Exam  Constitutional: She is oriented to person, place, and time.  Uncomfortable   HENT:  Head: Normocephalic.  Mouth/Throat: Oropharynx is clear and moist.  Eyes: EOM are normal. Pupils are equal, round, and reactive to light.  Neck: Normal range of motion. Neck supple.  Cardiovascular: Normal rate, regular rhythm and normal heart sounds.   Pulmonary/Chest: Effort normal.  Mild sternal tenderness   Abdominal: Soft. Bowel sounds are normal.  Mild epigastric tenderness, no rebound   Musculoskeletal: Normal range of motion.  Neurological: She is alert and oriented to person, place, and time.  Skin: Skin is warm.  Psychiatric: She has a normal mood and affect.  Nursing note and vitals reviewed.    ED Treatments / Results  Labs (all labs ordered are listed, but only abnormal results are displayed) Labs Reviewed  CBC WITH DIFFERENTIAL/PLATELET - Abnormal; Notable for the following:       Result Value   Hemoglobin 11.7 (*)    HCT 34.9 (*)    RDW 17.1 (*)    All other components within normal limits  COMPREHENSIVE METABOLIC PANEL - Abnormal; Notable for the following:     Glucose, Bld 129 (*)    Creatinine, Ser 1.41 (*)    GFR calc non Af Amer 36 (*)    GFR calc Af Amer 42 (*)    All other components within normal limits  TROPONIN I - Abnormal; Notable for the following:    Troponin I 0.05 (*)    All other components within normal limits  TROPONIN I  LIPASE, BLOOD    EKG  EKG Interpretation None       ED ECG REPORT I, Wandra Arthurs, the attending physician, personally viewed and interpreted this ECG.   Date: 09/26/2015  EKG Time: 15:51 pm  Rate: 64  Rhythm: normal EKG, normal sinus rhythm  Axis: normal  Intervals:none  ST&T Change: none   Radiology Ct Abdomen Pelvis Wo Contrast  Result Date: 09/26/2015 CLINICAL DATA:  Pt arrives via EMS from home, complains of chest pain and generalized abdomen pain that began at 1400, states pain is continuous in nature, EMS reports giving 324 ASA and 1 episode of vomiting, pt arrives with eyes closed, not speaking very much. Some nausea. EXAM: CT ABDOMEN AND PELVIS WITHOUT CONTRAST TECHNIQUE: Multidetector CT imaging of the abdomen and pelvis was performed following the standard protocol without IV contrast. COMPARISON:  03/01/2011 FINDINGS: Lung bases: Minor dependent subsegmental atelectasis. No evidence of pneumonia or edema. Heart normal size. Hepatobiliary: Normal liver. Gallbladder is distended. There a small dependent gallstone. No wall thickening or adjacent inflammation. No bile duct dilation. Spleen, pancreas, adrenal glands:  Normal. Kidneys, ureters, bladder: Right kidney is surgically absent. No left renal masses or stones. No hydronephrosis. Small peripherally calcified renal artery aneurysm is stable. Normal left ureter. Bladder is unremarkable. Uterus and adnexa:  Unremarkable. Lymph nodes:  No adenopathy. Ascites: None. Vascular: Atherosclerotic calcifications noted along a normal caliber abdominal aorta. Gastrointestinal: No bowel dilation. No wall thickening or inflammatory changes. There are few  colonic diverticula. Small hiatal hernia.  Musculoskeletal: Left hip total arthroplasty appears well seated and aligned. There are chronic changes of avascular necrosis of the right femoral head. Degenerative changes are noted of the lumbar spine. No osteoblastic or osteolytic lesions. IMPRESSION: 1. No acute findings within the abdomen or pelvis. 2. Cholelithiasis.  No CT evidence of acute cholecystitis. 3. Status post right nephrectomy. 4. There are several colonic diverticula but no evidence of diverticulitis. 5. Aortic atherosclerosis. Electronically Signed   By: Lajean Manes M.D.   On: 09/26/2015 21:12   Dg Chest 2 View  Result Date: 09/26/2015 CLINICAL DATA:  Chest pain. EXAM: CHEST  2 VIEW COMPARISON:  CT of the chest 12/04/2014 FINDINGS: The heart size and mediastinal contours are within normal limits. Both lungs are clear. The visualized skeletal structures are without acute abnormality. Exaggerated thoracic kyphosis due to osteoarthritic changes of the mid thoracic spine noted. IMPRESSION: No active cardiopulmonary disease. Electronically Signed   By: Fidela Salisbury M.D.   On: 09/26/2015 16:54    Procedures Procedures (including critical care time)  CRITICAL CARE Performed by: Wandra Arthurs   Total critical care time: 30 minutes  Critical care time was exclusive of separately billable procedures and treating other patients.  Critical care was necessary to treat or prevent imminent or life-threatening deterioration.  Critical care was time spent personally by me on the following activities: development of treatment plan with patient and/or surrogate as well as nursing, discussions with consultants, evaluation of patient's response to treatment, examination of patient, obtaining history from patient or surrogate, ordering and performing treatments and interventions, ordering and review of laboratory studies, ordering and review of radiographic studies, pulse oximetry and re-evaluation  of patient's condition.   Medications Ordered in ED Medications  HYDROmorphone (DILAUDID) injection 1 mg (1 mg Intravenous Not Given 09/26/15 1639)  Barium Sulfate 2.1 % SUSP 450 mL (450 mLs Oral Given 09/26/15 1856)  aspirin tablet 325 mg (not administered)  sodium chloride 0.9 % bolus 1,000 mL (0 mLs Intravenous Stopped 09/26/15 2020)  ondansetron (ZOFRAN) injection 4 mg (4 mg Intravenous Given 09/26/15 1610)  fentaNYL (SUBLIMAZE) injection 25 mcg (25 mcg Intravenous Given 09/26/15 1715)  fentaNYL (SUBLIMAZE) injection 25 mcg (25 mcg Intravenous Given 09/26/15 1925)     Initial Impression / Assessment and Plan / ED Course  I have reviewed the triage vital signs and the nursing notes.  Pertinent labs & imaging results that were available during my care of the patient were reviewed by me and considered in my medical decision making (see chart for details).  Clinical Course    Kerri Carter is a 75 y.o. female here with chest pain, epigastric pain. Consider ACS vs pancreatitis vs cholecystitis. Will get labs, trop x 2, LFT, lipase, CT ab/pel.   9:25 PM Second troponin 0.05 now. CT ab/pel showed gallstones but LFTs unremarkable. Will admit to trend serial troponins for possible NSTEMI. Pain free. Hold heparin for now. Ordered ASA 325 mg   Final Clinical Impressions(s) / ED Diagnoses   Final diagnoses:  None    New Prescriptions New Prescriptions   No medications on file     Drenda Freeze, MD 09/26/15 2127

## 2015-09-26 NOTE — ED Notes (Signed)
Husband at bedside.  

## 2015-09-26 NOTE — H&P (Signed)
[facility] NAME: Kerri Carter    MR#:  [id number]  DATE OF BIRTH:  02-01-1941  DATE OF ADMISSION:  09/26/2015  PRIMARY CARE PHYSICIAN: Coral Spikes, DO   REQUESTING/REFERRING PHYSICIAN: Darl Householder, MD  CHIEF COMPLAINT:   Chief Complaint  Patient presents with  . Chest Pain    HISTORY OF PRESENT ILLNESS:  Kerri Carter  is a 75 y.o. female who presents with An episode of chest pain. Patient states she is not doing any strenuous activity when she developed an episode of chest pain earlier today. She has a difficult time describing it and is a poor historian, but states that it was a very intense pain, substernal, nonradiating. She has difficulty characterizing it. She does not associate any aggravating or alleviating factors. The pain subsided somewhat on its own after some time. Here in the ED she was initially found to have a negative troponin, but her second troponin came back at 0.05. Hospitals were called for admission  PAST MEDICAL HISTORY:   Past Medical History:  Diagnosis Date  . Aortic insufficiency   . Chicken pox   . CKD (chronic kidney disease)   . COPD (chronic obstructive pulmonary disease) ([place])   . DM type 2 (diabetes mellitus, type 2) ([place])   . HTN (hypertension)   . Hyperlipidemia   . Mitral regurgitation   . 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 Toxic maculopathy     PAST SURGICAL HISTORY:   Past Surgical History:  Procedure Laterality Date  . APPENDECTOMY    . BUNIONECTOMY    . CESAREAN SECTION     x1  . 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 TONSILLECTOMY    . TOTAL HIP ARTHROPLASTY  12/10/11   [facility] left hip  . TUBAL LIGATION      SOCIAL HISTORY:   Social History  Substance Use Topics  . Smoking status: Former Smoker    Packs/day: 0.50    Years: 20.00    Types: Cigarettes    Quit date: 02/02/1974   . Smokeless tobacco: Never Used  . Alcohol use No    FAMILY HISTORY:   Family History  Problem Relation Age of Onset  . Rheum arthritis Mother   . Asthma Mother   . Parkinson's disease Mother   . Heart disease Mother   . Stroke Mother   . Hypertension Mother   . Heart attack Father   . Heart disease Father   . Hypertension Father   . Diabetes Son   . Asthma Sister   . Lung cancer Sister     was a smoker  . Breast cancer Sister   . Heart disease Sister   . Diabetes Sister   . Stroke Sister   . Heart attack Sister   . Lung cancer Sister   . Heart disease Brother   . Alcohol abuse Brother   . Heart disease Maternal Grandmother   . Diabetes Maternal Grandmother   . Colon cancer Maternal Grandmother     DRUG ALLERGIES:   Allergies  Allergen Reactions  . Ceftin [Cefuroxime Axetil] Anaphylaxis    REACTION: tongue and throat swell  . Lisinopril Anaphylaxis    REACTION: tongue and throat swelling (onset 10-10-09)  . Antihistamines, Chlorpheniramine-Type     REACTION: makes pt hyper  . Antivert [Meclizine Hcl]  REACTION: bladder will not empty  . Decongestant [Pseudoephedrine Hcl]     REACTION: makes pt hyper  . Doxycycline     REACTION: GI upset  . Morphine And Related Other (See Comments)    Per patient, low blood pressure issues, following morphine, that requires action to raise it back up.  . Sulfa Antibiotics     Face swelling  . Xarelto [Rivaroxaban]     GI bleed  . Adhesive [Tape] Rash  . Iodine Hives and Rash  . Levaquin [Levofloxacin In D5w] Rash  . Tetanus Toxoids     REACTION: rash, fever, hot to touch at injection site    MEDICATIONS AT HOME:   Prior to Admission medications   Medication Sig Start Date End Date Taking? Authorizing Provider  albuterol (VENTOLIN HFA) 108 (90 BASE) MCG/ACT inhaler Inhale 2 puffs into the lungs every 6 (six) hours as needed. Patient taking differently: Inhale 2 puffs into the lungs every 6 (six) hours as needed for  wheezing or shortness of breath.  04/14/12  Yes Tanda Rockers, MD  aspirin 81 MG tablet Take 81 mg by mouth daily.   Yes Historical Provider, MD  escitalopram (LEXAPRO) 5 MG tablet Take 5 mg by mouth at bedtime.  04/24/15  Yes Historical Provider, MD  folic acid (FOLVITE) 1 MG tablet Take 1 mg by mouth daily.   Yes Historical Provider, MD  furosemide (LASIX) 20 MG tablet take 1 tablet by mouth once daily 09/09/15  Yes Jayce G Cook, DO  gabapentin (NEURONTIN) 300 MG capsule Take 1 capsule (300 mg total) by mouth 4 (four) times daily. Patient taking differently: Take 300 mg by mouth 3 (three) times daily.  07/13/15  Yes Shela Leff, MD  HYDROcodone-acetaminophen (NORCO/VICODIN) 5-325 MG tablet Take 1 tablet by mouth every 6 (six) hours as needed for moderate pain. 01/15/15  Yes Landis Martins, DPM  lamoTRIgine (LAMICTAL) 100 MG tablet Take 1 tablet (100 mg total) by mouth 2 (two) times daily. 07/25/15  Yes Marcial Pacas, MD  LORazepam (ATIVAN) 1 MG tablet Take 1 mg by mouth 1 day or 1 dose. Patient taking once in the morning 06/20/15  Yes Historical Provider, MD  lovastatin (ALTOPREV) 20 MG 24 hr tablet Take 20 mg by mouth at bedtime.   Yes Historical Provider, MD  methotrexate 50 MG/2ML injection Inject 250 mg into the skin once a week. Inject 10 ml once weekly. Patient takes on Monday. 09/27/14  Yes Historical Provider, MD  montelukast (SINGULAIR) 10 MG tablet take 1 tablet by mouth once daily 08/13/15  Yes Jayce G Cook, DO  multivitamin-lutein (OCUVITE-LUTEIN) CAPS capsule Take 1 capsule by mouth 2 (two) times daily.   Yes Historical Provider, MD  nebivolol (BYSTOLIC) 10 MG tablet Take 1 tablet (10 mg total) by mouth daily. Patient taking differently: Take 5 mg by mouth daily.  07/23/15  Yes Juanito Doom, MD  pantoprazole (PROTONIX) 40 MG tablet Take 40 mg by mouth 2 (two) times daily.    Yes Historical Provider, MD  potassium chloride (K-DUR) 10 MEQ tablet take 1 tablet by mouth once daily 09/09/15   Yes Jayce G Cook, DO  ranitidine (ZANTAC) 150 MG tablet Take 150 mg by mouth 2 (two) times daily.   Yes Historical Provider, MD  traZODone (DESYREL) 100 MG tablet Take 200 mg by mouth at bedtime.   Yes Historical Provider, MD  benzonatate (TESSALON) 200 MG capsule Take 1 capsule (200 mg total) by mouth 3 (three) times daily as  needed for cough. 04/17/15   Chesley Mires, MD  budesonide-formoterol (SYMBICORT) 160-4.5 MCG/ACT inhaler Inhale 2 puffs into the lungs 2 (two) times daily. Patient not taking: Reported on 09/26/2015 09/06/12   Juanito Doom, MD    REVIEW OF SYSTEMS:  Review of Systems  Constitutional: Negative for chills, fever, malaise/fatigue and weight loss.  HENT: Negative for ear pain, hearing loss and tinnitus.   Eyes: Negative for blurred vision, double vision, pain and redness.  Respiratory: Negative for cough, hemoptysis and shortness of breath.   Cardiovascular: Positive for chest pain. Negative for palpitations, orthopnea and leg swelling.  Gastrointestinal: Negative for abdominal pain, constipation, diarrhea, nausea and vomiting.  Genitourinary: Negative for dysuria, frequency and hematuria.  Musculoskeletal: Negative for back pain, joint pain and neck pain.  Skin:       No acne, rash, or lesions  Neurological: Negative for dizziness, tremors, focal weakness and weakness.  Endo/Heme/Allergies: Negative for polydipsia. Does not bruise/bleed easily.  Psychiatric/Behavioral: Negative for depression. The patient is not nervous/anxious and does not have insomnia.      VITAL SIGNS:   Vitals:   09/26/15 1900 09/26/15 2000 09/26/15 2030 09/26/15 2200  BP: (!) 125/57 (!) 131/57 (!) 126/49 126/73  Pulse: 64 63 61 64  Resp: 14 11 18 13   SpO2: 98% 98% 97% 95%  Weight:      Height:       Wt Readings from Last 3 Encounters:  09/26/15 68 kg (150 lb)  08/14/15 69.9 kg (154 lb)  07/25/15 66.7 kg (147 lb)    PHYSICAL EXAMINATION:  Physical Exam  Vitals  reviewed. Constitutional: She is oriented to person, place, and time. She appears well-developed and well-nourished. No distress.  HENT:  Head: Normocephalic and atraumatic.  Mouth/Throat: Oropharynx is clear and moist.  Eyes: Conjunctivae and EOM are normal. Pupils are equal, round, and reactive to light. No scleral icterus.  Neck: Normal range of motion. Neck supple. No JVD present. No thyromegaly present.  Cardiovascular: Normal rate, regular rhythm and intact distal pulses.  Exam reveals no gallop and no friction rub.   No murmur heard. Respiratory: Effort normal and breath sounds normal. No respiratory distress. She has no wheezes. She has no rales.  GI: Soft. Bowel sounds are normal. She exhibits no distension. There is no tenderness.  Musculoskeletal: Normal range of motion. She exhibits no edema.  No arthritis, no gout  Lymphadenopathy:    She has no cervical adenopathy.  Neurological: She is alert and oriented to person, place, and time. No cranial nerve deficit.  No dysarthria, no aphasia  Skin: Skin is warm and dry. No rash noted. No erythema.  Psychiatric: She has a normal mood and affect. Her behavior is normal. Judgment and thought content normal.    LABORATORY PANEL:   CBC  Recent Labs Lab 09/26/15 1558  WBC 8.7  HGB 11.7*  HCT 34.9*  PLT 241   ------------------------------------------------------------------------------------------------------------------  Chemistries   Recent Labs Lab 09/26/15 1558  NA 139  K 3.7  CL 101  CO2 29  GLUCOSE 129*  BUN 20  CREATININE 1.41*  CALCIUM 8.9  AST 24  ALT 15  ALKPHOS 78  BILITOT 0.8   ------------------------------------------------------------------------------------------------------------------  Cardiac Enzymes  Recent Labs Lab 09/26/15 2033  TROPONINI 0.05*   ------------------------------------------------------------------------------------------------------------------  RADIOLOGY:  Ct  Abdomen Pelvis Wo Contrast  Result Date: 09/26/2015 CLINICAL DATA:  Pt arrives via EMS from home, complains of chest pain and generalized abdomen pain that began at 1400, states  pain is continuous in nature, EMS reports giving 324 ASA and 1 episode of vomiting, pt arrives with eyes closed, not speaking very much. Some nausea. EXAM: CT ABDOMEN AND PELVIS WITHOUT CONTRAST TECHNIQUE: Multidetector CT imaging of the abdomen and pelvis was performed following the standard protocol without IV contrast. COMPARISON:  03/01/2011 FINDINGS: Lung bases: Minor dependent subsegmental atelectasis. No evidence of pneumonia or edema. Heart normal size. Hepatobiliary: Normal liver. Gallbladder is distended. There a small dependent gallstone. No wall thickening or adjacent inflammation. No bile duct dilation. Spleen, pancreas, adrenal glands:  Normal. Kidneys, ureters, bladder: Right kidney is surgically absent. No left renal masses or stones. No hydronephrosis. Small peripherally calcified renal artery aneurysm is stable. Normal left ureter. Bladder is unremarkable. Uterus and adnexa:  Unremarkable. Lymph nodes:  No adenopathy. Ascites: None. Vascular: Atherosclerotic calcifications noted along a normal caliber abdominal aorta. Gastrointestinal: No bowel dilation. No wall thickening or inflammatory changes. There are few colonic diverticula. Small hiatal hernia. Musculoskeletal: Left hip total arthroplasty appears well seated and aligned. There are chronic changes of avascular necrosis of the right femoral head. Degenerative changes are noted of the lumbar spine. No osteoblastic or osteolytic lesions. IMPRESSION: 1. No acute findings within the abdomen or pelvis. 2. Cholelithiasis.  No CT evidence of acute cholecystitis. 3. Status post right nephrectomy. 4. There are several colonic diverticula but no evidence of diverticulitis. 5. Aortic atherosclerosis. Electronically Signed   By: Lajean Manes M.D.   On: 09/26/2015 21:12   Dg  Chest 2 View  Result Date: 09/26/2015 CLINICAL DATA:  Chest pain. EXAM: CHEST  2 VIEW COMPARISON:  CT of the chest 12/04/2014 FINDINGS: The heart size and mediastinal contours are within normal limits. Both lungs are clear. The visualized skeletal structures are without acute abnormality. Exaggerated thoracic kyphosis due to osteoarthritic changes of the mid thoracic spine noted. IMPRESSION: No active cardiopulmonary disease. Electronically Signed   By: Fidela Salisbury M.D.   On: 09/26/2015 16:54    EKG:   Orders placed or performed during the hospital encounter of 09/26/15  . EKG 12-Lead  . EKG 12-Lead    IMPRESSION AND PLAN:  Principal Problem:   Chest pain - trend her troponins tonight, and evaluate from there as to whether or not she needs a cardiology consult for further workup. Active Problems:   COPD (chronic obstructive pulmonary disease) ([place]) - continue home meds   Hypertension - currently stable, continue home meds   Anxiety and depression - continue home medications   Conversion disorder - patient states that she has conversion disorder, noted here but not felt to be part of her acute workup.  All the records are reviewed and case discussed with ED provider. Management plans discussed with the patient and/or family.  DVT PROPHYLAXIS: SubQ lovenox  GI PROPHYLAXIS: PP  ADMISSION STATUS: Observation  CODE STATUS: Full Code Status History    This patient does not have a recorded code status. Please follow your organizational policy for patients in this situation.      TOTAL TIME TAKING CARE OF THIS PATIENT: 40 minutes.    Matheus Spiker [place] 09/26/2015, 10:24 PM  [facility]  Office  [phone]  CC: Primary care physician; Coral Spikes, DO

## 2015-09-27 ENCOUNTER — Encounter: Payer: Self-pay | Admitting: Nurse Practitioner

## 2015-09-27 DIAGNOSIS — I10 Essential (primary) hypertension: Secondary | ICD-10-CM | POA: Diagnosis present

## 2015-09-27 DIAGNOSIS — R0789 Other chest pain: Secondary | ICD-10-CM | POA: Diagnosis not present

## 2015-09-27 DIAGNOSIS — N183 Chronic kidney disease, stage 3 (moderate): Secondary | ICD-10-CM

## 2015-09-27 DIAGNOSIS — N1832 Chronic kidney disease, stage 3b: Secondary | ICD-10-CM | POA: Diagnosis present

## 2015-09-27 LAB — CBC
HCT: 32.2 % — ABNORMAL LOW (ref 35.0–47.0)
Hemoglobin: 10.9 g/dL — ABNORMAL LOW (ref 12.0–16.0)
MCH: 31 pg (ref 26.0–34.0)
MCHC: 34 g/dL (ref 32.0–36.0)
MCV: 91.3 fL (ref 80.0–100.0)
Platelets: 202 10*3/uL (ref 150–440)
RBC: 3.53 MIL/uL — ABNORMAL LOW (ref 3.80–5.20)
RDW: 17.1 % — ABNORMAL HIGH (ref 11.5–14.5)
WBC: 6.9 10*3/uL (ref 3.6–11.0)

## 2015-09-27 LAB — HEMOGLOBIN A1C: Hgb A1c MFr Bld: 5.5 % (ref 4.0–6.0)

## 2015-09-27 LAB — BASIC METABOLIC PANEL
Anion gap: 12 (ref 5–15)
BUN: 17 mg/dL (ref 6–20)
CO2: 24 mmol/L (ref 22–32)
Calcium: 8.4 mg/dL — ABNORMAL LOW (ref 8.9–10.3)
Chloride: 106 mmol/L (ref 101–111)
Creatinine, Ser: 1.25 mg/dL — ABNORMAL HIGH (ref 0.44–1.00)
GFR calc Af Amer: 48 mL/min — ABNORMAL LOW (ref >60)
GFR calc non Af Amer: 42 mL/min — ABNORMAL LOW (ref >60)
Glucose, Bld: 78 mg/dL (ref 65–99)
Potassium: 4.1 mmol/L (ref 3.5–5.1)
Sodium: 142 mmol/L (ref 135–145)

## 2015-09-27 LAB — TROPONIN I
Troponin I: <0.03 ng/mL (ref <0.03)
Troponin I: <0.03 ng/mL (ref <0.03)
Troponin I: <0.03 ng/mL (ref <0.03)

## 2015-09-27 MED ORDER — PRAVASTATIN SODIUM 20 MG PO TABS: 20.0000 mg | ORAL_TABLET | Freq: Every day | ORAL | 0 refills | Status: DC

## 2015-09-27 MED ORDER — PRAVASTATIN SODIUM 20 MG PO TABS
20.0000 mg | ORAL_TABLET | Freq: Once | ORAL | Status: AC
  Administered 2015-09-27: 20 mg via ORAL
  Filled 2015-09-27 (×2): qty 1

## 2015-09-27 NOTE — Progress Notes (Signed)
Pt. Discharged to home via wx. Discharge instructions and medication regimen reviewed at bedside with patient and spouse. Both verbalize understanding of instructions and medication regimen. Prescriptions sent to pharmacy, pt aware and states she will not pick it up because she already takes lovastatin. Patient assessment unchanged from this morning. TELE and IV discontinued per policy. One telemetry electrode left some irritation and open skin, this was dressed with gauze and paper taper per patient request. Otherwise, skin intact per AM assessment.

## 2015-09-27 NOTE — Progress Notes (Signed)
During bedside report, pt expressed anxiety and concern about having to pay for medications she receives while hospitalized, given her observation admission status. States she asked admitting physician "not to admit me under observation." Patient states she will not take her medications this morning and that her husband will bring them for her to take. Informed patient she is unable to use her home medications if hospital pharmacy stocks them, this is hospital policy. Attempted to answer questions to patients satisfaction. Informed patient that a care manager will help answer her questions better as patient continues to express concern.

## 2015-09-27 NOTE — Consult Note (Signed)
Cardiology Consult    Patient ID: Kerri Carter MRN: [id number], DOB/AGE: 73-Aug-1942   Admit date: 09/26/2015 Date of Consult: 09/27/2015  Primary Physician: Coral Spikes, DO Primary Cardiologist: T. Povsic, MD/L. Povsic, PA-C Requesting Provider: Doylene Canning  Patient Profile    75 y/o ? with a h/o mild to mod AI, mild MR, TR, AS, HTN, HL, and non-obstructive CAD, who was admitted 8/24 with chest pain.  Past Medical History   Past Medical History:  Diagnosis Date  . Anxiety   . Chicken pox   . CKD (chronic kidney disease), stage III    a. s/p R nephrectomy.  . Conversion disorder   . COPD (chronic obstructive pulmonary disease) ([place])   . Depression   . DM type 2 (diabetes mellitus, type 2) ([place])   . Essential hypertension   . GERD (gastroesophageal reflux disease)   . Hyperlipidemia   . Inflammatory arthritis (HCC)    a. hands/carpal tunnel.  b. Low titer rheumatoid factor. c. Negative anti-CCP antibodies. d. Plaquenil.  . Non-Obstructive CAD    a. 07/2009 Cath ([facility]): nonobs dzs;  b. 03/2011 Cath [facility]): nonobs dzs.  . Osteoarthritis    a. Knees.  . PUD (peptic ulcer disease)   . Toxic maculopathy   . Valvular heart disease    a. 07/2015 Echo: EF 55-60%, Mild AI, AS, MR, and TR.    Past Surgical History:  Procedure Laterality Date  . APPENDECTOMY    . BUNIONECTOMY    . CESAREAN SECTION     x1  . 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 TONSILLECTOMY    . TOTAL HIP ARTHROPLASTY  12/10/11   [facility] left hip  . TUBAL LIGATION       Allergies  Allergies  Allergen Reactions  . Ceftin [Cefuroxime Axetil] Anaphylaxis    REACTION: tongue and throat swell  . Lisinopril Anaphylaxis    REACTION: tongue and throat swelling (onset 10-10-09)  . Antihistamines, Chlorpheniramine-Type     REACTION: makes pt hyper  . Antivert [Meclizine Hcl]     REACTION: bladder will not empty  . Decongestant [Pseudoephedrine Hcl]    REACTION: makes pt hyper  . Doxycycline     REACTION: GI upset  . Morphine And Related Other (See Comments)    Per patient, low blood pressure issues, following morphine, that requires action to raise it back up.  . Sulfa Antibiotics     Face swelling  . Xarelto [Rivaroxaban]     GI bleed  . Adhesive [Tape] Rash  . Iodine Hives and Rash  . Levaquin [Levofloxacin In D5w] Rash  . Tetanus Toxoids     REACTION: rash, fever, hot to touch at injection site    History of Present Illness    75 y/o ? with a h/o valvular heart dzs (mild-mod AI, most recently read as mild, mild MR/TR/AS in 07/2015), nonobs CAD, HTN, HL, CKD III, anxiety, depression, and recently dx conversion disorder.  She's previously undergone caths in 2011 @ [facility] and subsequently in 2013 @ [facility], both reportedly showing non-obstructive CAD.  She is followed closely by [facility]iology and was last seen in March with complaints of sharp chest pain.  Echo was performed showing stable valvular dzs.  It was not felt that further ischemic eval was necessary.  Since then, she says that she's done reasonably well but has had headaches and other neurologic Ss that lead to a complete neuro  eval with an eventual dx of conversion disorder.  She was in her [place] until the afternoon of 8/24, when she was walking across her home and had sudden onset of severe, sharp, midsternal chest pain with associated nausea and followed by one episode of vomiting.  Her husband called EMS and she was treated with ASA an "something through [her] IV."  She was taken to the [facility] ED where ECG was non-acute however initial troponin was mildly elevated @ 0.05.  She was eventually given hydrocodone with resolution of c/p approx 8 hrs after onset.  Since admission, subsequent troponins have remained normal x3.  She has had recurrent left upper chest and shoulder pain with deep breathing.  C/p does not change with position changes or palpation.  Cardiology has been asked to  eval.  Inpatient Medications    . aspirin  81 mg Oral Daily  . enoxaparin (LOVENOX) injection  40 mg Subcutaneous Q24H  . escitalopram  5 mg Oral QHS  . famotidine  20 mg Oral BID  . furosemide  20 mg Oral Daily  . gabapentin  300 mg Oral TID  .  HYDROmorphone (DILAUDID) injection  1 mg Intravenous Once  . lamoTRIgine  100 mg Oral BID  . montelukast  10 mg Oral Daily  . multivitamin-lutein  1 capsule Oral BID  . nebivolol  5 mg Oral Daily  . pantoprazole  40 mg Oral BID  . pravastatin  20 mg Oral QHS  . sodium chloride flush  3 mL Intravenous Q12H  . traZODone  200 mg Oral QHS    Family History    Family History  Problem Relation Age of Onset  . Rheum arthritis Mother   . Asthma Mother   . Parkinson's disease Mother   . Heart disease Mother   . Stroke Mother   . Hypertension Mother   . Heart attack Father   . Heart disease Father   . Hypertension Father   . Diabetes Son   . Asthma Sister   . Lung cancer Sister     was a smoker  . Breast cancer Sister   . Heart disease Sister   . Diabetes Sister   . Stroke Sister   . Heart attack Sister   . Lung cancer Sister   . Heart disease Brother   . Alcohol abuse Brother   . Heart disease Maternal Grandmother   . Diabetes Maternal Grandmother   . Colon cancer Maternal Grandmother     Social History    Social History   Social History  . Marital status: Married    Spouse name: N/A  . Number of children: 2  . Years of education: Some Coll   Occupational History  . Retired    Social History Main Topics  . Smoking status: Former Smoker    Packs/day: 0.50    Years: 20.00    Types: Cigarettes    Quit date: 02/02/1974  . Smokeless tobacco: Never Used  . Alcohol use No  . Drug use: No  . Sexual activity: Not on file   Other Topics Concern  . Not on file   Social History Narrative   Lives at home with her husband and grandson.   Right-handed.   6 cups coffee per day.     Review of Systems    General:  No  chills, fever, night sweats or weight changes.  Cardiovascular:  +++ chest pain, no dyspnea on exertion, edema, orthopnea, palpitations, paroxysmal nocturnal dyspnea. Dermatological: No  rash, lesions/masses Respiratory: No cough, dyspnea Urologic: No hematuria, dysuria Abdominal:   +++ nausea, +++ vomiting, no diarrhea, bright red blood per rectum, melena, or hematemesis Neurologic:  No visual changes, occas wkns assoc with headaches.  No changes in mental status. All other systems reviewed and are otherwise negative except as noted above.  Physical Exam    Blood pressure (!) 122/42, pulse 64, temperature 97.8 F (36.6 C), temperature source Oral, resp. rate 16, height 5\' 4"  (1.626 m), weight 155 lb 11.2 oz (70.6 kg), SpO2 97 %.  General: Pleasant, NAD Psych: Normal affect. Neuro: Alert and oriented X 3. Moves all extremities spontaneously. HEENT: Normal  Neck: Supple without bruits or JVD. Lungs:  Resp regular and unlabored, CTA. Heart: RRR no s3, s4, 2/6 SEM heard best @ RUSB. Abdomen: Soft, non-tender, non-distended, BS + x 4.  Extremities: No clubbing, cyanosis or edema. DP/PT/Radials 2+ and equal bilaterally.  Labs     Recent Labs  09/26/15 2033 09/27/15 0129 09/27/15 0501 09/27/15 1040  TROPONINI 0.05* <0.03 <0.03 <0.03   Lab Results  Component Value Date   WBC 6.9 09/27/2015   HGB 10.9 (L) 09/27/2015   HCT 32.2 (L) 09/27/2015   MCV 91.3 09/27/2015   PLT 202 09/27/2015     Recent Labs Lab 09/26/15 1558 09/27/15 0501  NA 139 142  K 3.7 4.1  CL 101 106  CO2 29 24  BUN 20 17  CREATININE 1.41* 1.25*  CALCIUM 8.9 8.4*  PROT 6.8  --   BILITOT 0.8  --   ALKPHOS 78  --   ALT 15  --   AST 24  --   GLUCOSE 129* 78    Radiology Studies    Ct Abdomen Pelvis Wo Contrast  Result Date: 09/26/2015 CLINICAL DATA:  Pt arrives via EMS from home, complains of chest pain and generalized abdomen pain that began at 1400, states pain is continuous in nature, EMS reports  giving 324 ASA and 1 episode of vomiting, pt arrives with eyes closed, not speaking very much. Some nausea. EXAM: CT ABDOMEN AND PELVIS WITHOUT CONTRAST TECHNIQUE: Multidetector CT imaging of the abdomen and pelvis was performed following the standard protocol without IV contrast. COMPARISON:  03/01/2011 FINDINGS: Lung bases: Minor dependent subsegmental atelectasis. No evidence of pneumonia or edema. Heart normal size. Hepatobiliary: Normal liver. Gallbladder is distended. There a small dependent gallstone. No wall thickening or adjacent inflammation. No bile duct dilation. Spleen, pancreas, adrenal glands:  Normal. Kidneys, ureters, bladder: Right kidney is surgically absent. No left renal masses or stones. No hydronephrosis. Small peripherally calcified renal artery aneurysm is stable. Normal left ureter. Bladder is unremarkable. Uterus and adnexa:  Unremarkable. Lymph nodes:  No adenopathy. Ascites: None. Vascular: Atherosclerotic calcifications noted along a normal caliber abdominal aorta. Gastrointestinal: No bowel dilation. No wall thickening or inflammatory changes. There are few colonic diverticula. Small hiatal hernia. Musculoskeletal: Left hip total arthroplasty appears well seated and aligned. There are chronic changes of avascular necrosis of the right femoral head. Degenerative changes are noted of the lumbar spine. No osteoblastic or osteolytic lesions. IMPRESSION: 1. No acute findings within the abdomen or pelvis. 2. Cholelithiasis.  No CT evidence of acute cholecystitis. 3. Status post right nephrectomy. 4. There are several colonic diverticula but no evidence of diverticulitis. 5. Aortic atherosclerosis. Electronically Signed   By: Lajean Manes M.D.   On: 09/26/2015 21:12   Dg Chest 2 View  Result Date: 09/26/2015 CLINICAL DATA:  Chest pain. EXAM: CHEST  2 VIEW COMPARISON:  CT of the chest 12/04/2014 FINDINGS: The heart size and mediastinal contours are within normal limits. Both lungs are  clear. The visualized skeletal structures are without acute abnormality. Exaggerated thoracic kyphosis due to osteoarthritic changes of the mid thoracic spine noted. IMPRESSION: No active cardiopulmonary disease. Electronically Signed   By: Fidela Salisbury M.D.   On: 09/26/2015 16:54    ECG & Cardiac Imaging    RSR, 64, no acute ST/T changes.  Assessment & Plan    1.  Atypical Chest Pain:  Pt presented 8/24 with sharp chest pain associated with nausea and one episode of vomiting.  C/p persisted approx 8 hrs prior to resolving after receiving hydrocodone.  Initial trop was minimally elevated @ 0.05 however all subsequent trops have been nl @ <0.03 x 3.  She currently c/o chest pain/left shoulder pain with deep breathing only.  Prior cardiac hx includes caths in 2011 and 2013, both reportedly showing nonobs dzs.  In the setting of atypical c/p and in the absence of objective evidence of ischemia despite prolonged ss, she does not require further inpatient cardiology evaluation.  Provided that she is stable with ambulation, she could likely be discharged and f/u with her primary cardiologist @ [facility] for consideration of outpt stress testing.  Cont asa,  blocker, PPI, and statin.  2.  Essential HTN:  Stable on  blocker.  3.  HL:  On statin.  Followed by PCP as outpt.  4.  CKD III:  Stable.  5.  Conversion d/o/anxiety/depression:  ? Role in admission Ss.  Signed, Murray Hodgkins, NP 09/27/2015, 12:59 PM

## 2015-09-27 NOTE — Progress Notes (Signed)
Per cardiology note, pt ok to discharge. Dr. Anselm Jungling sent text page with this update.

## 2015-09-27 NOTE — Care Management Obs Status (Signed)
[facility] NOTIFICATION   Patient Details  Name: TREACY ANASTASIO MRN: [id number] Date of Birth: 1940/08/24   Medicare Observation Status Notification Given:  Yes    Jolly Mango, RN 09/27/2015, 9:31 AM

## 2015-09-27 NOTE — Discharge Instructions (Signed)
Follow with your Cardiologist at [facility] next week.

## 2015-09-27 NOTE — Progress Notes (Signed)
Patient reporting sharp chest pain, 3/10, that worsens with inspiration and intermittently radiates down her left arm. Patient declines offered pain relief medication, stating "I will not pay $50 for one pill." RN reviewed hospital policy regarding home medications once again with patient, patient states, "Well then, I will have my husband sneak in my medications so I can take them." RN reviewed hospital policy again with patient, stating that the reason for this policy is to ensure patient safety and medication integrity. Patient continues to refuse any and all medication, Dr. Anselm Jungling paged, will be notified when call returned. Will continue to monitor.

## 2015-09-30 ENCOUNTER — Telehealth: Payer: Self-pay

## 2015-09-30 ENCOUNTER — Ambulatory Visit (INDEPENDENT_AMBULATORY_CARE_PROVIDER_SITE_OTHER): Payer: 59 | Admitting: Psychiatry

## 2015-09-30 ENCOUNTER — Encounter: Payer: Self-pay | Admitting: Psychiatry

## 2015-09-30 VITALS — BP 120/64 | HR 73 | Temp 97.9°F | Ht 64.0 in | Wt 155.4 lb

## 2015-09-30 DIAGNOSIS — F3160 Bipolar disorder, current episode mixed, unspecified: Secondary | ICD-10-CM

## 2015-09-30 DIAGNOSIS — F413 Other mixed anxiety disorders: Secondary | ICD-10-CM | POA: Diagnosis not present

## 2015-09-30 MED ORDER — LORAZEPAM 0.5 MG PO TABS: 0.5000 mg | ORAL_TABLET | Freq: Every day | ORAL | 0 refills | Status: DC

## 2015-09-30 MED ORDER — ESCITALOPRAM OXALATE 10 MG PO TABS: 10.0000 mg | ORAL_TABLET | Freq: Every day | ORAL | 0 refills | Status: DC

## 2015-09-30 MED ORDER — QUETIAPINE FUMARATE 25 MG PO TABS: 25.0000 mg | ORAL_TABLET | Freq: Every day | ORAL | 0 refills | Status: DC

## 2015-09-30 MED ORDER — LAMOTRIGINE 100 MG PO TABS: 100.0000 mg | ORAL_TABLET | Freq: Two times a day (BID) | ORAL | 2 refills | Status: DC

## 2015-09-30 NOTE — Telephone Encounter (Signed)
faxed and confirmed rx faxed for ativan .5mg  id # [id number] order # [id number].

## 2015-09-30 NOTE — Progress Notes (Signed)
Psychiatric Initial Adult Assessment   Patient Identification: Kerri Carter MRN:  [id number] Date of Evaluation:  09/30/2015 Referral Source: PCP Chief Complaint:   Chief Complaint    Establish Care; Anxiety     Visit Diagnosis:    ICD-9-CM ICD-10-CM   1. Mixed bipolar I disorder (HCC) 296.60 F31.60   2. Other mixed anxiety disorders 300.09 F41.3     History of Present Illness:   Patient is a 75 year old white female who presented for initial assessment. She reported that she has a history of conversion disorder which was diagnosed by a neurologist at [facility]. She reported that she has history of numbness and tingling in her legs as well as history of headaches. It happened in June when she was visiting she was visiting her family in [place]. Patient reported that she was taking to the hospital. Since then she has also listed on neurologist in [place]. They have started her on lamotrigine. Patient reported that she continued to have the same symptoms for the whole week in June. Since she was started on lamotrigine she has noted that her mood  Symptoms are  getting worse. She feels that she is also has some confusion related to the lorazepam. Patient stated that she does not sleep well and has been taking trazodone at night. She was interested in having her medications adjusted at this time. She currently denied having any suicidal homicidal ideations or plans. She was pleasant and cooperative during the interview.  Associated Signs/Symptoms: Depression Symptoms:  insomnia, fatigue, feelings of worthlessness/guilt, anxiety, disturbed sleep, (Hypo) Manic Symptoms:  Impulsivity, Irritable Mood, Labiality of Mood, Anxiety Symptoms:  Excessive Worry, Psychotic Symptoms:  Hallucinations: Visual PTSD Symptoms: Negative NA  Past Psychiatric History: H/o conversion disorder.  Previous Psychotropic Medications:  Trazodone  ambien lexapro lamictal   Substance Abuse History in  the last 12 months:  No.  Consequences of Substance Abuse: Negative NA  Past Medical History:  Past Medical History:  Diagnosis Date  . Anxiety   . Chicken pox   . CKD (chronic kidney disease), stage III    a. s/p R nephrectomy.  . Conversion disorder   . COPD (chronic obstructive pulmonary disease) ([place])   . Depression   . DM type 2 (diabetes mellitus, type 2) ([place])   . Essential hypertension   . GERD (gastroesophageal reflux disease)   . Hyperlipidemia   . Inflammatory arthritis (HCC)    a. hands/carpal tunnel.  b. Low titer rheumatoid factor. c. Negative anti-CCP antibodies. d. Plaquenil.  . Non-Obstructive CAD    a. 07/2009 Cath ([facility]): nonobs dzs;  b. 03/2011 Cath [facility]): nonobs dzs.  . Osteoarthritis    a. Knees.  . PUD (peptic ulcer disease)   . Toxic maculopathy   . Valvular heart disease    a. 07/2015 Echo: EF 55-60%, Mild AI, AS, MR, and TR.    Past Surgical History:  Procedure Laterality Date  . APPENDECTOMY    . BUNIONECTOMY    . CESAREAN SECTION     x1  . 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 TONSILLECTOMY    . TOTAL HIP ARTHROPLASTY  12/10/11   [facility] left hip  . TUBAL LIGATION      Family Psychiatric History:  denied  Family History:  Family History  Problem Relation Age of Onset  . Rheum arthritis Mother   . Asthma Mother   . Parkinson's disease Mother   . Heart  disease Mother   . Stroke Mother   . Hypertension Mother   . Heart attack Father   . Heart disease Father   . Hypertension Father   . Diabetes Son   . Asthma Sister   . Heart disease Sister   . Lung cancer Sister   . Heart disease Sister   . Heart disease Sister   . Breast cancer Sister   . Heart attack Sister   . Heart disease Brother   . Heart disease Maternal Grandmother   . Diabetes Maternal Grandmother   . Colon cancer Maternal Grandmother   . Heart disease Brother   . Alcohol abuse Brother   . Depression Brother      Social History:   Social History   Social History  . Marital status: Married    Spouse name: N/A  . Number of children: 2  . Years of education: Some Coll   Occupational History  . Retired    Social History Main Topics  . Smoking status: Former Smoker    Packs/day: 0.50    Years: 20.00    Types: Cigarettes    Quit date: 02/02/1974  . Smokeless tobacco: Never Used  . Alcohol use No  . Drug use: No  . Sexual activity: Not Currently   Other Topics Concern  . None   Social History Narrative   Lives at home in [place] with her husband and grandson.   Right-handed.   6 cups coffee per day.    Additional Social History:  Married Lives with husband, grand son and his fiance.   Allergies:   Allergies  Allergen Reactions  . Ceftin [Cefuroxime Axetil] Anaphylaxis    REACTION: tongue and throat swell  . Lisinopril Anaphylaxis    REACTION: tongue and throat swelling (onset 10-10-09)  . Antihistamines, Chlorpheniramine-Type     REACTION: makes pt hyper  . Antivert [Meclizine Hcl]     REACTION: bladder will not empty  . Decongestant [Pseudoephedrine Hcl]     REACTION: makes pt hyper  . Doxycycline     REACTION: GI upset  . Morphine And Related Other (See Comments)    Per patient, low blood pressure issues, following morphine, that requires action to raise it back up.  . Sulfa Antibiotics     Face swelling  . Xarelto [Rivaroxaban]     GI bleed  . Adhesive [Tape] Rash  . Iodine Hives and Rash  . Levaquin [Levofloxacin In D5w] Rash  . Tetanus Toxoids     REACTION: rash, fever, hot to touch at injection site    Metabolic Disorder Labs: Lab Results  Component Value Date   HGBA1C 5.5 09/27/2015   No results found for: PROLACTIN No results found for: CHOL, TRIG, HDL, CHOLHDL, VLDL, LDLCALC   Current Medications: Current Outpatient Prescriptions  Medication Sig Dispense Refill  . albuterol (VENTOLIN HFA) 108 (90 BASE) MCG/ACT inhaler Inhale 2 puffs into the  lungs every 6 (six) hours as needed. (Patient taking differently: Inhale 2 puffs into the lungs every 6 (six) hours as needed for wheezing or shortness of breath. ) 1 Inhaler 1  . aspirin 81 MG tablet Take 81 mg by mouth daily.    . budesonide-formoterol (SYMBICORT) 160-4.5 MCG/ACT inhaler Inhale 2 puffs into the lungs 2 (two) times daily. 1 Inhaler 2  . escitalopram (LEXAPRO) 10 MG tablet Take 1 tablet (10 mg total) by mouth daily after breakfast. 30 tablet 0  . folic acid (FOLVITE) 1 MG tablet Take 1 mg by  mouth daily.    . furosemide (LASIX) 20 MG tablet take 1 tablet by mouth once daily 30 tablet 4  . gabapentin (NEURONTIN) 300 MG capsule Take 1 capsule (300 mg total) by mouth 4 (four) times daily. (Patient taking differently: Take 300 mg by mouth 3 (three) times daily. ) 120 capsule 2  . HYDROcodone-acetaminophen (NORCO/VICODIN) 5-325 MG tablet Take 1 tablet by mouth every 6 (six) hours as needed for moderate pain. 30 tablet 0  . lamoTRIgine (LAMICTAL) 100 MG tablet Take 1 tablet (100 mg total) by mouth 2 (two) times daily. 60 tablet 2  . LORazepam (ATIVAN) 0.5 MG tablet Take 1 tablet (0.5 mg total) by mouth at bedtime. Patient taking once in the morning 30 tablet 0  . lovastatin (ALTOPREV) 20 MG 24 hr tablet Take 20 mg by mouth at bedtime.    . methotrexate 50 MG/2ML injection Inject 250 mg into the skin once a week. Inject 10 ml once weekly. Patient takes on Monday.    . montelukast (SINGULAIR) 10 MG tablet take 1 tablet by mouth once daily 30 tablet 3  . multivitamin-lutein (OCUVITE-LUTEIN) CAPS capsule Take 1 capsule by mouth 2 (two) times daily.    . nebivolol (BYSTOLIC) 10 MG tablet Take 1 tablet (10 mg total) by mouth daily. (Patient taking differently: Take 5 mg by mouth daily. ) 30 tablet 3  . pantoprazole (PROTONIX) 40 MG tablet Take 40 mg by mouth 2 (two) times daily.     . potassium chloride (K-DUR) 10 MEQ tablet take 1 tablet by mouth once daily 30 tablet 4  . ranitidine (ZANTAC)  150 MG tablet Take 150 mg by mouth 2 (two) times daily.    . QUEtiapine (SEROQUEL) 25 MG tablet Take 1 tablet (25 mg total) by mouth at bedtime. 30 tablet 0   No current facility-administered medications for this visit.     Neurologic: Headache: No Seizure: No Paresthesias:No  Musculoskeletal: Strength & Muscle Tone: within normal limits Gait & Station: normal Patient leans: N/A  Psychiatric Specialty Exam: ROS  Blood pressure 120/64, pulse 73, temperature 97.9 F (36.6 C), temperature source Oral, height 5\' 4"  (1.626 m), weight 155 lb 6.4 oz (70.5 kg).Body mass index is 26.67 kg/m.  General Appearance: Casual  Eye Contact:  Fair  Speech:  Clear and Coherent  Volume:  Normal  Mood:  Anxious and Depressed  Affect:  Appropriate  Thought Process:  Coherent  Orientation:  Full (Time, Place, and Person)  Thought Content:  WDL  Suicidal Thoughts:  No  Homicidal Thoughts:  No  Memory:  Immediate;   Fair Recent;   Fair Remote;   Fair  Judgement:  Fair  Insight:  Fair  Psychomotor Activity:  Decreased  Concentration:  Concentration: Fair and Attention Span: Fair  Recall:  [employer] of Knowledge:Fair  Language: Fair  Akathisia:  No  Handed:  Right  AIMS (if indicated):    Assets:  Communication Skills Desire for Improvement Physical Health  ADL's:  Intact  Cognition: WNL  Sleep:  poor    Treatment Plan Summary: Medication management   Discussed with patient about her medications. I will adjust her medications as follows.  Continue lamotrigine 100 mg by mouth twice a day Increase Lexapro 10 mg every morning Decrease lorazepam 0.5 mg by mouth daily at bedtime Discontinue trazodone I will start her on Seroquel 25 mg by mouth daily at bedtime for mood symptoms and depression. Discussed with her about the side effects in detail and  she demonstrated understanding.  Follow-up in 2 weeks or earlier depending on her symptoms.   More than 50% of the time spent in  psychoeducation, counseling and coordination of care.    This note was generated in part or whole with voice recognition software. Voice regonition is usually quite accurate but there are transcription errors that can and very often do occur. I apologize for any typographical errors that were not detected and corrected.    Rainey Pines, MD 8/28/20171:01 PM

## 2015-10-03 ENCOUNTER — Encounter: Payer: Self-pay | Admitting: Family Medicine

## 2015-10-03 ENCOUNTER — Ambulatory Visit (INDEPENDENT_AMBULATORY_CARE_PROVIDER_SITE_OTHER): Payer: Medicare Other | Admitting: Family Medicine

## 2015-10-03 DIAGNOSIS — R0789 Other chest pain: Secondary | ICD-10-CM | POA: Diagnosis not present

## 2015-10-03 DIAGNOSIS — H6091 Unspecified otitis externa, right ear: Secondary | ICD-10-CM

## 2015-10-03 DIAGNOSIS — H6090 Unspecified otitis externa, unspecified ear: Secondary | ICD-10-CM | POA: Insufficient documentation

## 2015-10-03 MED ORDER — OXYCODONE-ACETAMINOPHEN 5-325 MG PO TABS: 1.0000 | ORAL_TABLET | Freq: Three times a day (TID) | ORAL | 0 refills | Status: DC | PRN

## 2015-10-03 NOTE — Progress Notes (Signed)
Subjective:  Patient ID: Kerri Carter, female    DOB: Apr 01, 1940  Age: 75 y.o. MRN: [id number]  CC: Followup, Ear pain  HPI:  75 year old female with a complicated past medical history including COPD, OSA, valvular heart disease, rheumatoid arthritis, hyperlipidemia, hypertension, DM 2, conversion disorder presents for follow-up.  Patient was recently seen and admitted at [facility] after she presented with chest pain. Workup was negative. Chest pain was thought to be atypical. She was instructed to follow-up with her outpatient cardiologist.  Patient resents today for follow-up. She's had no further chest pain. No reports of shortness of breath. She states that she has follow-up with her cardiologist soon.  Additionally, patient reports severe right ear pain. She states that she was seen at [facility] yesterday and was treated for otitis externa. She continues to have severe pain of her right ear. She is only use the eardrops one time. She states that she was not told how often to administer them. She is currently on Cortisporin otic. She has been taking Percocet for her pain and is requesting additional medication today. No reports of drainage from the ear canal. No fevers or chills. No other complaints at this time  Social Hx   Social History   Social History  . Marital status: Married    Spouse name: N/A  . Number of children: 2  . Years of education: Some Coll   Occupational History  . Retired    Social History Main Topics  . Smoking status: Former Smoker    Packs/day: 0.50    Years: 20.00    Types: Cigarettes    Quit date: 02/02/1974  . Smokeless tobacco: Never Used  . Alcohol use No  . Drug use: No  . Sexual activity: Not Currently   Other Topics Concern  . None   Social History Narrative   Lives at home in [place] with her husband and grandson.   Right-handed.   6 cups coffee per day.    Review of Systems  HENT: Positive for ear pain.   Cardiovascular:       No  recurrence of chest pain.   Objective:  BP 135/71 (BP Location: Left Arm, Patient Position: Sitting, Cuff Size: Normal)   Pulse 73   Temp 98.2 F (36.8 C) (Oral)   Wt 152 lb 4 oz (69.1 kg)   SpO2 97%   BMI 26.13 kg/m   BP/Weight 10/03/2015 09/30/2015 [id number]  Systolic BP [id number]  Diastolic BP 71 64 42  Wt. (Lbs) 152.25 155.4 -  BMI 26.13 26.67 -   Physical Exam  Constitutional: She is oriented to person, place, and time. She appears well-developed. No distress.  HENT:  Severe R ear tenderness. Normal TM.  Cardiovascular: Normal rate and regular rhythm.   Soft systolic murmur.   Pulmonary/Chest: Effort normal.  Neurological: She is alert and oriented to person, place, and time.  Psychiatric: She has a normal mood and affect.  Vitals reviewed.  Lab Results  Component Value Date   WBC 6.9 09/27/2015   HGB 10.9 (L) 09/27/2015   HCT 32.2 (L) 09/27/2015   PLT 202 09/27/2015   GLUCOSE 78 09/27/2015   ALT 15 09/26/2015   AST 24 09/26/2015   NA 142 09/27/2015   K 4.1 09/27/2015   CL 106 09/27/2015   CREATININE 1.25 (H) 09/27/2015   BUN 17 09/27/2015   CO2 24 09/27/2015   INR 0.9 07/03/2013   HGBA1C 5.5 09/27/2015   Assessment & Plan:  Problem List Items Addressed This Visit    Chest pain    Stable at this time.  No further chest pain. Has cardiology followup.       Otitis externa    New problem. Advised Cortisporin 4 drops TID x 5-7 days. Given severe pain, Rx given for Percocet 5/325 # 10.       Other Visit Diagnoses   None.    Meds ordered this encounter  Medications  . oxyCODONE-acetaminophen (ROXICET) 5-325 MG tablet    Sig: Take 1 tablet by mouth every 8 (eight) hours as needed for severe pain.    Dispense:  10 tablet    Refill:  0   Follow-up: PRN  [facility]

## 2015-10-03 NOTE — Assessment & Plan Note (Signed)
New problem. Advised Cortisporin 4 drops TID x 5-7 days. Given severe pain, Rx given for Percocet 5/325 # 10.

## 2015-10-03 NOTE — Patient Instructions (Signed)
Use the drops as directed - 4 drops in the affected ear 3 times daily.  This will quickly improve.  Take care  Dr. Lacinda Axon

## 2015-10-03 NOTE — Progress Notes (Signed)
Pre visit review using our clinic review tool, if applicable. No additional management support is needed unless otherwise documented below in the visit note. 

## 2015-10-03 NOTE — Assessment & Plan Note (Signed)
Stable at this time.  No further chest pain. Has cardiology followup.

## 2015-10-08 IMAGING — MG MM DIGITAL SCREENING BILAT W/ TOMO W/ CAD
8 of 12 series · 8 of 28 positions shown · non-contrast
Comparison: Previous exam(s).

CLINICAL DATA: Screening.

EXAM:
DIGITAL SCREENING BILATERAL MAMMOGRAM WITH 3D TOMO WITH CAD

[L MLO synth-2D]
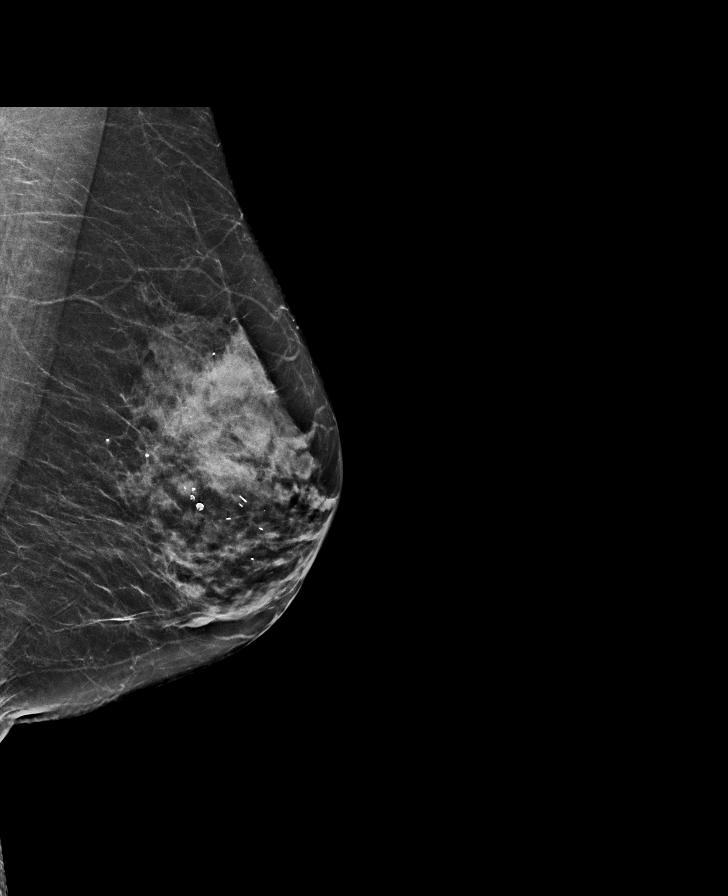

[R CC synth-2D]
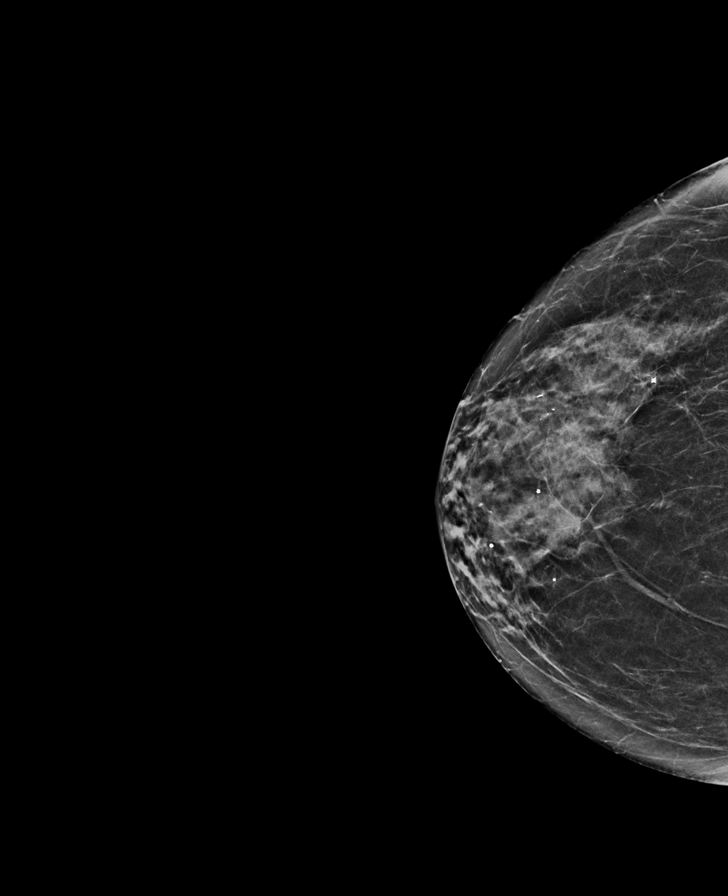

[R MLO]
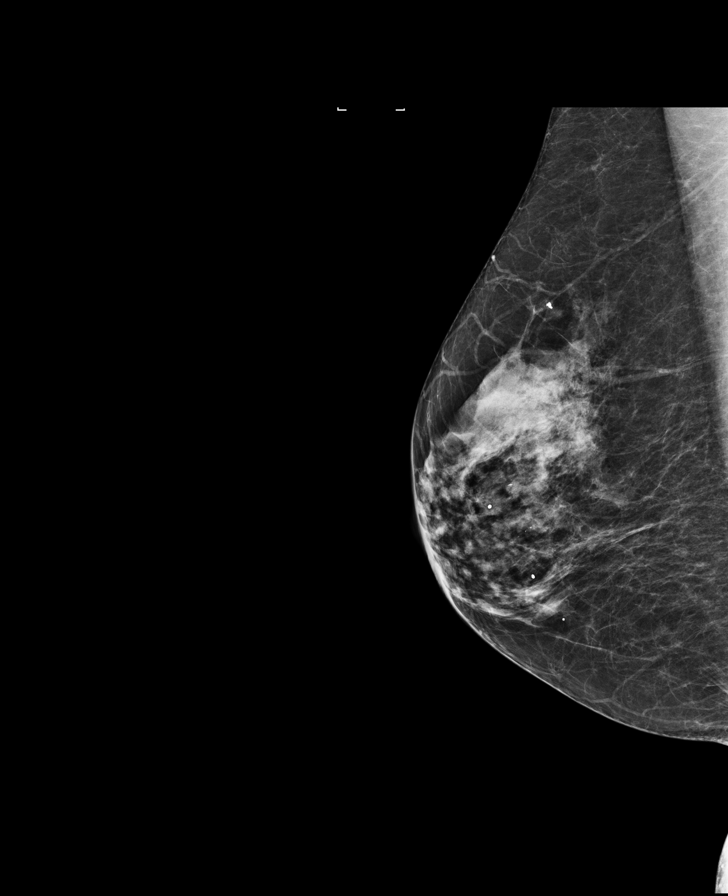

[R CC]
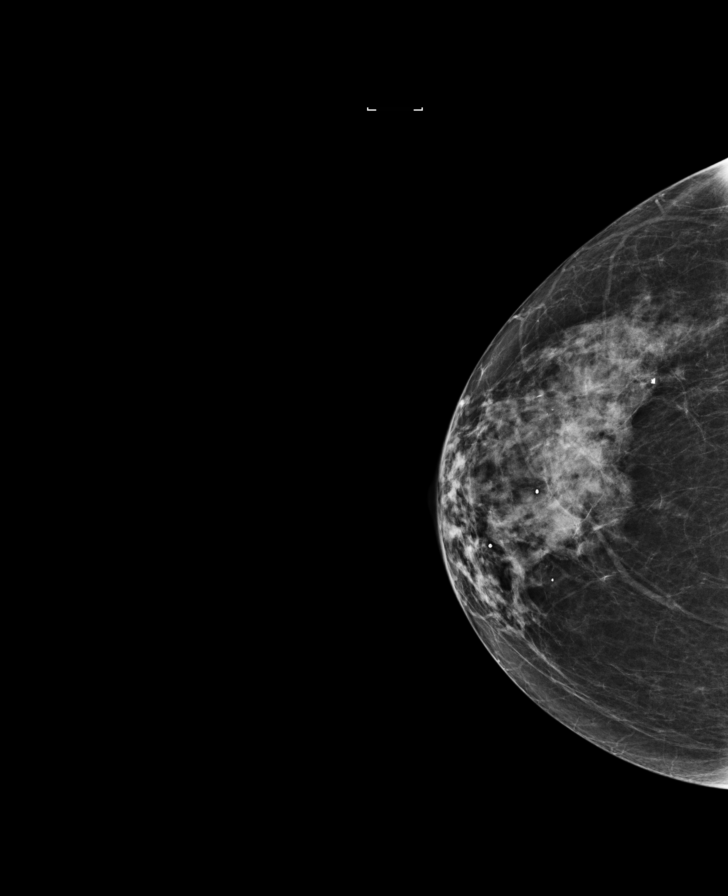

[L CC synth-2D]
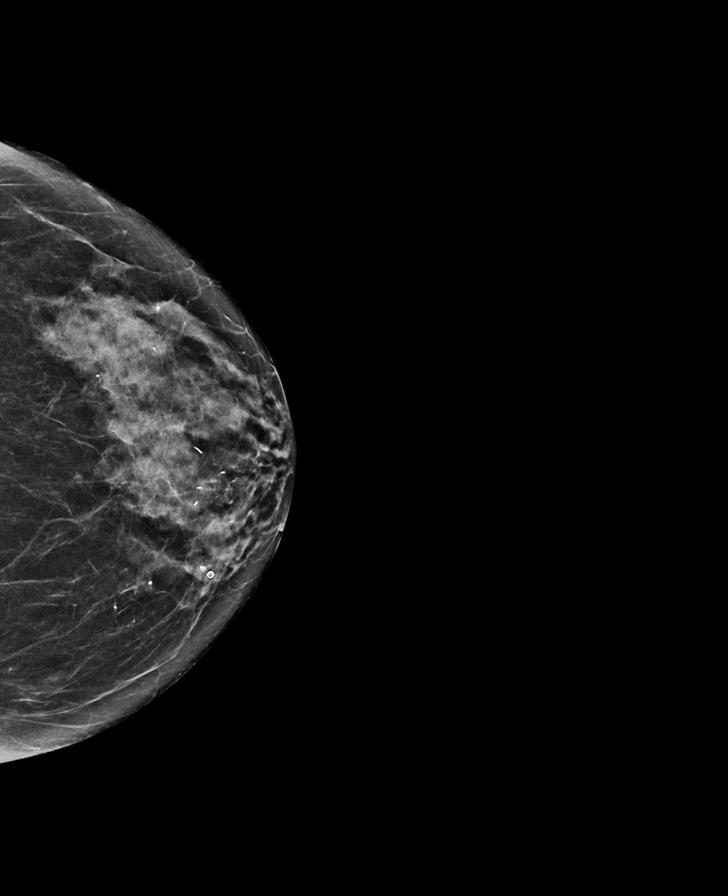

[L MLO]
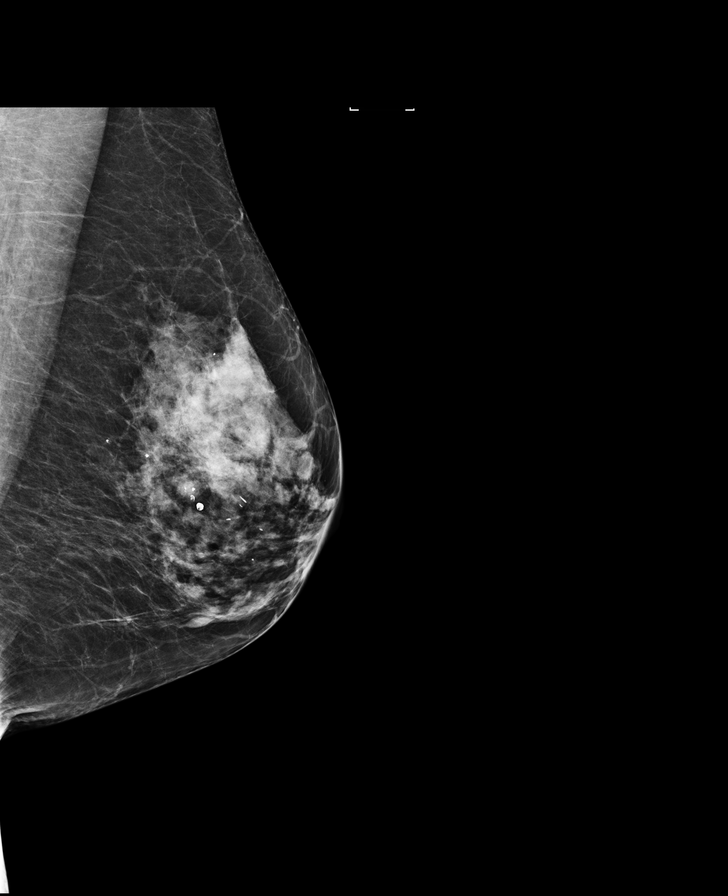

[R MLO synth-2D]
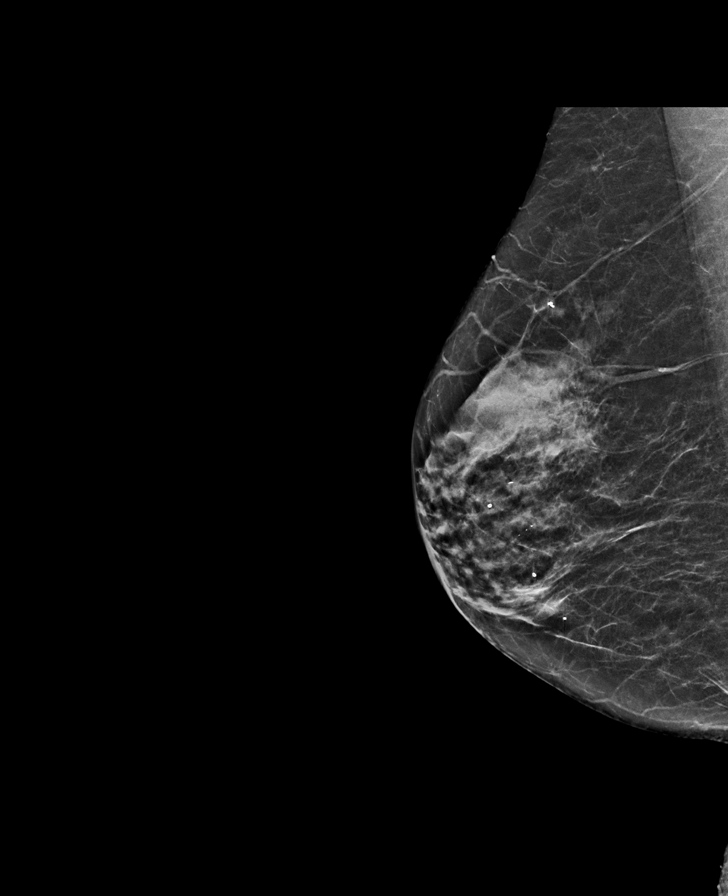

[L CC]
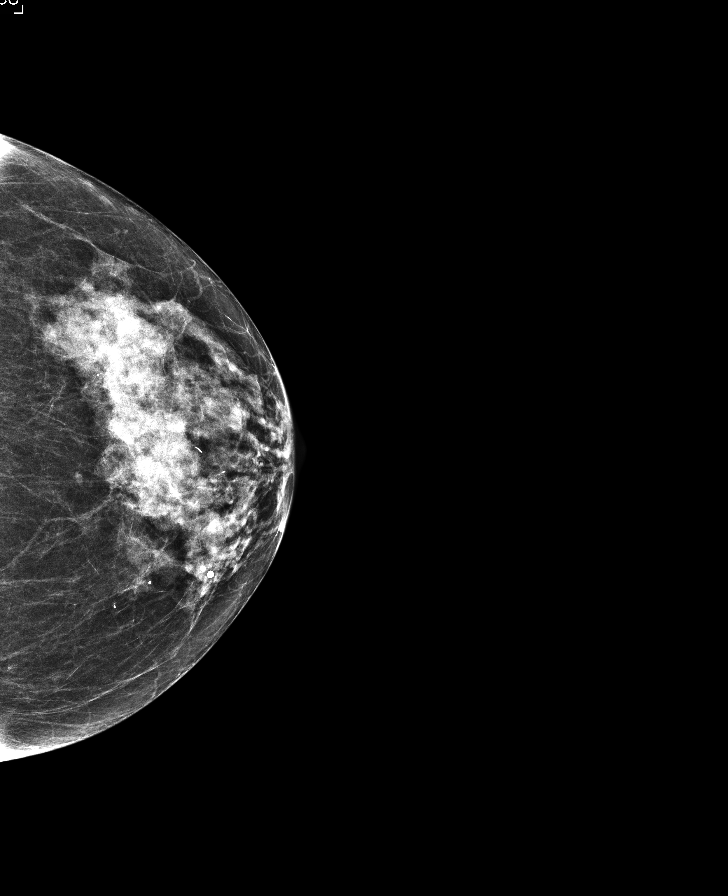

[8 of 28 positions shown; findings below may reference images not displayed]

ACR Breast Density Category c: The breast tissue is heterogeneously
dense, which may obscure small masses.
FINDINGS: There are no findings suspicious for malignancy. Images were
processed with CAD.
IMPRESSION: No mammographic evidence of malignancy. A result letter of this
screening mammogram will be mailed directly to the patient.

RECOMMENDATION:
Screening mammogram in one year. (Code:[id number])

BI-RADS CATEGORY  1: Negative.

## 2015-10-09 ENCOUNTER — Ambulatory Visit (INDEPENDENT_AMBULATORY_CARE_PROVIDER_SITE_OTHER): Payer: 59 | Admitting: Licensed Clinical Social Worker

## 2015-10-09 DIAGNOSIS — F413 Other mixed anxiety disorders: Secondary | ICD-10-CM

## 2015-10-09 DIAGNOSIS — F3160 Bipolar disorder, current episode mixed, unspecified: Secondary | ICD-10-CM | POA: Insufficient documentation

## 2015-10-09 HISTORY — DX: Bipolar disorder, current episode mixed, unspecified: F31.60

## 2015-10-09 NOTE — Progress Notes (Signed)
   THERAPIST PROGRESS NOTE  Session Time: 11 AM to 11:50 AM  Participation Level: Active  Behavioral Response: CasualAlertEuthymic  Type of Therapy: Individual Therapy  Treatment Goals addressed:  patient will work on coping skills to manage anxiety, challenge unhelpful thought patterns and learn and implement skills to help in stabilizing mood  Interventions: CBT, Solution Focused, Strength-based, Supportive and Other: Psychoeducation on anxiety, coping skills for anxiety  Summary: Kerri Carter is a 75 y.o. female who presents with no "spells" since being on current dosage of Lamictal. She describes symptoms that were similar to a heart attack but tested by a doctor and no heart issues indicated. Plans to get a stress test but discussed how symptoms seem to indicate panic attacks. Discussed how one way her anxiety manifests itself is through physical symptoms. Patient recognizes that she tries to have control over everything and that "I've got this". Discussed how her symptoms are way of telling her something is wrong and she needs to address it. Discussed how suppression of emotions and trying to control leads to needing some outlet to release and in patient's case appears to be through physical symptoms. Patient recognizes perfectionists thinking. Shared that she can't wait until tomorrow and has to get things done "now". She completed the plan with therapist and recognizes that she needs to work on being more relaxed about doing things, coping skills to manage anxiety and manage mood lability. Patient also recognizes that she worries about potentially negative events happening in the future even though she realizes she does not have control. Reviewed diagnosis of bipolar and patient recognizes irritability and mood lability in herself  Suicidal/Homicidal: No  Therapist Response: Therapist educated patient and challenged her on perfectionistic thinking. Therapist explained that is unrealistic,  can be unhelpful and distorted. Related that perfectionism can be self defeating as it leaves one little chance of meeting their goals, feeling good about themselves and can lead to frustration, worry, and depression. Therapist encouraged patient on more realistic standards for herself that will help decrease her symptoms. Encouraged patient to be more self compassionate as this will help with her mental well-being and described it as treating herself like she wasn't a good friend. Educated patient on generalized anxiety and described this type of worry as a form of verbal mental problem solving about potentially negative future events so it is not helpful. Explained it is more helpful and useful to stay focused on problem solving and action in the present. Completed treatment plan.  Educated patient on diagnosis mixed bipolar 1 disorder. Completed treatment plan  Plan: Return again in 1 month.2. Patient challenge perfectionists thinking and take steps to create a more balanced lifestyle by setting more realistic standards  Diagnosis: Axis I:  mixed bipolar 1 disorder, other mixed anxiety disorder     Axis II: No diagnosis    Kimla Furth A, LCSW 10/09/2015

## 2015-10-09 NOTE — Discharge Summary (Signed)
[place] [place] NAME: Kerri Carter    MR#:  [id number]  DATE OF BIRTH:  [birth year]  DATE OF ADMISSION:  09/26/2015 ADMITTING PHYSICIAN: Lance Coon, MD  DATE OF DISCHARGE: 09/27/2015  5:44 PM  PRIMARY CARE PHYSICIAN: Coral Spikes, DO    ADMISSION DIAGNOSIS:  Unstable angina ([place]) [I20.0]  DISCHARGE DIAGNOSIS:  Principal Problem:   Chest pain Active Problems:   COPD (chronic obstructive pulmonary disease) (HCC)   Anxiety and depression   Conversion disorder   CKD (chronic kidney disease), stage III   Essential hypertension   SECONDARY DIAGNOSIS:   Past Medical History:  Diagnosis Date  . Anxiety   . Chicken pox   . CKD (chronic kidney disease), stage III    a. s/p R nephrectomy.  . Conversion disorder   . COPD (chronic obstructive pulmonary disease) ([place])   . Depression   . DM type 2 (diabetes mellitus, type 2) ([place])   . Essential hypertension   . GERD (gastroesophageal reflux disease)   . Hyperlipidemia   . Inflammatory arthritis (HCC)    a. hands/carpal tunnel.  b. Low titer rheumatoid factor. c. Negative anti-CCP antibodies. d. Plaquenil.  . Non-Obstructive CAD    a. 07/2009 Cath ([facility]): nonobs dzs;  b. 03/2011 Cath [facility]): nonobs dzs.  . Osteoarthritis    a. Knees.  . PUD (peptic ulcer disease)   . Toxic maculopathy   . Valvular heart disease    a. 07/2015 Echo: EF 55-60%, Mild AI, AS, MR, and TR.    HOSPITAL COURSE:   Chest pain -   troponin negative , cardiology consulted, they suggested to schedule her stress test as out pt with [facility].   Pt agreed with that and discharged. Active Problems:   COPD (chronic obstructive pulmonary disease) ([place]) - continue home meds   Hypertension - currently stable, continue home meds   Anxiety and depression - continue home medications   Conversion disorder - patient states that she has conversion disorder, noted here but not felt to be part of her acute  workup.   DISCHARGE CONDITIONS:   Stable.  CONSULTS OBTAINED:    DRUG ALLERGIES:   Allergies  Allergen Reactions  . Ceftin [Cefuroxime Axetil] Anaphylaxis    REACTION: tongue and throat swell  . Lisinopril Anaphylaxis    REACTION: tongue and throat swelling (onset 10-10-09)  . Antihistamines, Chlorpheniramine-Type     REACTION: makes pt hyper  . Antivert [Meclizine Hcl]     REACTION: bladder will not empty  . Decongestant [Pseudoephedrine Hcl]     REACTION: makes pt hyper  . Doxycycline     REACTION: GI upset  . Morphine And Related Other (See Comments)    Per patient, low blood pressure issues, following morphine, that requires action to raise it back up.  . Sulfa Antibiotics     Face swelling  . Xarelto [Rivaroxaban]     GI bleed  . Adhesive [Tape] Rash  . Iodine Hives and Rash  . Levaquin [Levofloxacin In D5w] Rash  . Tetanus Toxoids     REACTION: rash, fever, hot to touch at injection site    DISCHARGE MEDICATIONS:   Discharge Medication List as of 09/27/2015  4:22 PM    START taking these medications   Details  pravastatin (PRAVACHOL) 20 MG tablet Take 1 tablet (20 mg total) by mouth at bedtime., Starting Fri 09/27/2015, Normal      CONTINUE these medications which have NOT  CHANGED   Details  albuterol (VENTOLIN HFA) 108 (90 BASE) MCG/ACT inhaler Inhale 2 puffs into the lungs every 6 (six) hours as needed., Starting 04/14/2012, Until Discontinued, Normal    aspirin 81 MG tablet Take 81 mg by mouth daily., Until Discontinued, Historical Med    budesonide-formoterol (SYMBICORT) 160-4.5 MCG/ACT inhaler Inhale 2 puffs into the lungs 2 (two) times daily., Starting 09/06/2012, Until Discontinued, Normal    folic acid (FOLVITE) 1 MG tablet Take 1 mg by mouth daily., Until Discontinued, Historical Med    furosemide (LASIX) 20 MG tablet take 1 tablet by mouth once daily, Normal    gabapentin (NEURONTIN) 300 MG capsule Take 1 capsule (300 mg total) by mouth 4 (four)  times daily., Starting Sat 07/13/2015, Print    lovastatin (ALTOPREV) 20 MG 24 hr tablet Take 20 mg by mouth at bedtime., Until Discontinued, Historical Med    methotrexate 50 MG/2ML injection Inject 250 mg into the skin once a week. Inject 10 ml once weekly. Patient takes on Monday., Starting Thu 09/27/2014, Historical Med    montelukast (SINGULAIR) 10 MG tablet take 1 tablet by mouth once daily, Normal    multivitamin-lutein (OCUVITE-LUTEIN) CAPS capsule Take 1 capsule by mouth 2 (two) times daily., Until Discontinued, Historical Med    nebivolol (BYSTOLIC) 10 MG tablet Take 1 tablet (10 mg total) by mouth daily., Starting Tue 07/23/2015, Normal    pantoprazole (PROTONIX) 40 MG tablet Take 40 mg by mouth 2 (two) times daily. , Until Discontinued, Historical Med    potassium chloride (K-DUR) 10 MEQ tablet take 1 tablet by mouth once daily, Normal    ranitidine (ZANTAC) 150 MG tablet Take 150 mg by mouth 2 (two) times daily., Until Discontinued, Historical Med    benzonatate (TESSALON) 200 MG capsule Take 1 capsule (200 mg total) by mouth 3 (three) times daily as needed for cough., Starting 04/17/2015, Until Discontinued, Normal    escitalopram (LEXAPRO) 5 MG tablet Take 5 mg by mouth at bedtime. , Starting Wed 04/24/2015, Historical Med    HYDROcodone-acetaminophen (NORCO/VICODIN) 5-325 MG tablet Take 1 tablet by mouth every 6 (six) hours as needed for moderate pain., Starting 01/15/2015, Until Discontinued, Print    lamoTRIgine (LAMICTAL) 100 MG tablet Take 1 tablet (100 mg total) by mouth 2 (two) times daily., Starting Thu 07/25/2015, Normal    LORazepam (ATIVAN) 1 MG tablet Take 1 mg by mouth 1 day or 1 dose. Patient taking once in the morning, Starting Thu 06/20/2015, Historical Med    traZODone (DESYREL) 100 MG tablet Take 200 mg by mouth at bedtime., Until Discontinued, Historical Med         DISCHARGE INSTRUCTIONS:    Follow with [facility]ology in 1 week.  If you experience  worsening of your admission symptoms, develop shortness of breath, life threatening emergency, suicidal or homicidal thoughts you must seek medical attention immediately by calling 911 or calling your MD immediately  if symptoms less severe.  You Must read complete instructions/literature along with all the possible adverse reactions/side effects for all the Medicines you take and that have been prescribed to you. Take any new Medicines after you have completely understood and accept all the possible adverse reactions/side effects.   Please note  You were cared for by a hospitalist during your hospital stay. If you have any questions about your discharge medications or the care you received while you were in the hospital after you are discharged, you can call the unit and asked to speak  with the hospitalist on call if the hospitalist that took care of you is not available. Once you are discharged, your primary care physician will handle any further medical issues. Please note that NO REFILLS for any discharge medications will be authorized once you are discharged, as it is imperative that you return to your primary care physician (or establish a relationship with a primary care physician if you do not have one) for your aftercare needs so that they can reassess your need for medications and monitor your lab values.    Today   CHIEF COMPLAINT:   Chief Complaint  Patient presents with  . Chest Pain    HISTORY OF PRESENT ILLNESS:  Kerri Carter  is a 75 y.o. female presents with An episode of chest pain. Patient states she is not doing any strenuous activity when she developed an episode of chest pain earlier today. She has a difficult time describing it and is a poor historian, but states that it was a very intense pain, substernal, nonradiating. She has difficulty characterizing it. She does not associate any aggravating or alleviating factors. The pain subsided somewhat on its own after some time.  Here in the ED she was initially found to have a negative troponin, but her second troponin came back at 0.05. Hospitals were called for admission   VITAL SIGNS:  Blood pressure (!) 122/42, pulse 64, temperature 97.8 F (36.6 C), temperature source Oral, resp. rate 16, height 5\' 4"  (1.626 m), weight 70.6 kg (155 lb 11.2 oz), SpO2 97 %.  I/O:  No intake or output data in the 24 hours ending 10/09/15 1029  PHYSICAL EXAMINATION:   Constitutional: She is oriented to person, place, and time. She appears well-developed and well-nourished. No distress.  HENT:  Head: Normocephalic and atraumatic.  Mouth/Throat: Oropharynx is clear and moist.  Eyes: Conjunctivae and EOM are normal. Pupils are equal, round, and reactive to light. No scleral icterus.  Neck: Normal range of motion. Neck supple. No JVD present. No thyromegaly present.  Cardiovascular: Normal rate, regular rhythm and intact distal pulses.  Exam reveals no gallop and no friction rub.   No murmur heard. Respiratory: Effort normal and breath sounds normal. No respiratory distress. She has no wheezes. She has no rales.  GI: Soft. Bowel sounds are normal. She exhibits no distension. There is no tenderness.  Musculoskeletal: Normal range of motion. She exhibits no edema.  No arthritis, no gout  Lymphadenopathy:    She has no cervical adenopathy.  Neurological: She is alert and oriented to person, place, and time. No cranial nerve deficit.  No dysarthria, no aphasia  Skin: Skin is warm and dry. No rash noted. No erythema.  Psychiatric: She has a normal mood and affect. Her behavior is normal. Judgment and thought content normal.   DATA REVIEW:   CBC No results for input(s): WBC, HGB, HCT, PLT in the last 168 hours.  Chemistries  No results for input(s): NA, K, CL, CO2, GLUCOSE, BUN, CREATININE, CALCIUM, MG, AST, ALT, ALKPHOS, BILITOT in the last 168 hours.  Invalid input(s): GFRCGP  Cardiac Enzymes No results for input(s):  TROPONINI in the last 168 hours.  Microbiology Results  Results for orders placed or performed in visit on 07/21/13  Culture, blood (single)     Status: None   Collection Time: 07/21/13 11:37 AM  Result Value Ref Range Status   Micro Text Report   Final       COMMENT  NO GROWTH AEROBICALLY/ANAEROBICALLY IN 5 DAYS   ANTIBIOTIC                                                      Culture, blood (single)     Status: None   Collection Time: 07/21/13 11:37 AM  Result Value Ref Range Status   Micro Text Report   Final       COMMENT                   NO GROWTH AEROBICALLY/ANAEROBICALLY IN 5 DAYS   ANTIBIOTIC                                                      Urine culture     Status: None   Collection Time: 07/21/13 11:37 AM  Result Value Ref Range Status   Micro Text Report   Final       SOURCE: IN AND OUT CATH    ORGANISM 1                >100,000 CFU/ML KLEBSIELLA PNEUMONIAE SSP PNEUMONI   ORGANISM 2                40,000 CFU/ML ENTEROCOCCUS FAECALIS   ANTIBIOTIC                    ORG#1    ORG#2     AMPICILLIN                    R        S         CEFAZOLIN                     S                  CEFOXITIN                     S                  CEFTRIAXONE                   S                  CIPROFLOXACIN                 S        S         GENTAMICIN                    S                  IMIPENEM                      S                  LEVOFLOXACIN                  S        S         NITROFURANTOIN  S        S         TRIMETHOPRIM/SULFAMETHOXAZOLE S                  LINEZOLID                              S         TETRACYCLINE                           R             RADIOLOGY:  No results found.  EKG:   Orders placed or performed during the hospital encounter of 09/26/15  . EKG 12-Lead  . EKG 12-Lead  . EKG      Management plans discussed with the patient, family and they are in agreement.  CODE STATUS:  Code Status  History    Date Active Date Inactive Code Status Order ID Comments User Context   09/26/2015 10:59 PM 09/27/2015  8:44 PM Full Code [id number]  Lance Coon, MD ED      TOTAL TIME TAKING CARE OF THIS PATIENT: 35 minutes.    Vaughan Basta M.D on 10/09/2015 at 10:29 AM  Between 7am to 6pm - Pager - [phone]  After 6pm go to [email or web address] - password [email or web address] [facility]  Office  [phone]  CC: Primary care physician; Coral Spikes, DO   Note: This dictation was prepared with Dragon dictation along with smaller phrase technology. Any transcriptional errors that result from this process are unintentional.

## 2015-10-10 ENCOUNTER — Ambulatory Visit: Payer: Medicare Other

## 2015-10-16 ENCOUNTER — Ambulatory Visit (INDEPENDENT_AMBULATORY_CARE_PROVIDER_SITE_OTHER): Payer: 59 | Admitting: Psychiatry

## 2015-10-16 ENCOUNTER — Encounter: Payer: Self-pay | Admitting: Psychiatry

## 2015-10-16 VITALS — BP 116/65 | HR 69 | Temp 98.4°F | Ht 64.0 in | Wt 155.4 lb

## 2015-10-16 DIAGNOSIS — F3160 Bipolar disorder, current episode mixed, unspecified: Secondary | ICD-10-CM | POA: Diagnosis not present

## 2015-10-16 MED ORDER — LORAZEPAM 0.5 MG PO TABS: 0.5000 mg | ORAL_TABLET | Freq: Every day | ORAL | 1 refills | Status: DC

## 2015-10-16 MED ORDER — ESCITALOPRAM OXALATE 10 MG PO TABS: 10.0000 mg | ORAL_TABLET | Freq: Every day | ORAL | 1 refills | Status: DC

## 2015-10-16 MED ORDER — QUETIAPINE FUMARATE 25 MG PO TABS: 25.0000 mg | ORAL_TABLET | Freq: Every day | ORAL | 0 refills | Status: DC

## 2015-10-16 MED ORDER — LAMOTRIGINE 100 MG PO TABS: 100.0000 mg | ORAL_TABLET | Freq: Two times a day (BID) | ORAL | 2 refills | Status: DC

## 2015-10-16 NOTE — Progress Notes (Signed)
Psychiatric MD Progress Note  Patient Identification: Kerri Carter MRN:  [id number] Date of Evaluation:  10/16/2015 Referral Source: PCP Chief Complaint:   Chief Complaint    Follow-up; Medication Refill     Visit Diagnosis:    ICD-9-CM ICD-10-CM   1. Mixed bipolar I disorder (HCC) 296.60 F31.60     History of Present Illness:   Patient is a 75 year old white female who presented for follow up. Patient reported that she has started improving on her medications and she is trying to change her behavior. She reported that she been compliant with her medication and now she is able to sleep well. She stated that her husband has also noticed improvement in her behavior. She sleeps well with the combination of Seroquel and lorazepam. She is also taking lamotrigine twice daily. We discussed at length about her diagnosis and her more symptoms. She appeared much alert and calm during the interview. She feels that her therapy sessions are also helping her. She denied having any conversion symptoms at this time.     Associated Signs/Symptoms: Depression Symptoms:  insomnia, fatigue, feelings of worthlessness/guilt, anxiety, disturbed sleep, (Hypo) Manic Symptoms:  Impulsivity, Irritable Mood, Labiality of Mood, Anxiety Symptoms:  Excessive Worry, Psychotic Symptoms:  Hallucinations: Visual PTSD Symptoms: Negative NA  Past Psychiatric History: H/o conversion disorder.  Previous Psychotropic Medications:  Trazodone  ambien lexapro lamictal   Substance Abuse History in the last 12 months:  No.  Consequences of Substance Abuse: Negative NA  Past Medical History:  Past Medical History:  Diagnosis Date  . Anxiety   . Chicken pox   . CKD (chronic kidney disease), stage III    a. s/p R nephrectomy.  . Conversion disorder   . COPD (chronic obstructive pulmonary disease) ([place])   . Depression   . DM type 2 (diabetes mellitus, type 2) ([place])   . Essential hypertension   . GERD  (gastroesophageal reflux disease)   . Hyperlipidemia   . Inflammatory arthritis (HCC)    a. hands/carpal tunnel.  b. Low titer rheumatoid factor. c. Negative anti-CCP antibodies. d. Plaquenil.  . Non-Obstructive CAD    a. 07/2009 Cath ([facility]): nonobs dzs;  b. 03/2011 Cath [facility]): nonobs dzs.  . Osteoarthritis    a. Knees.  . PUD (peptic ulcer disease)   . Toxic maculopathy   . Valvular heart disease    a. 07/2015 Echo: EF 55-60%, Mild AI, AS, MR, and TR.    Past Surgical History:  Procedure Laterality Date  . APPENDECTOMY    . BUNIONECTOMY    . CESAREAN SECTION     x1  . 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 TONSILLECTOMY    . TOTAL HIP ARTHROPLASTY  12/10/11   [facility] left hip  . TUBAL LIGATION      Family Psychiatric History:  denied  Family History:  Family History  Problem Relation Age of Onset  . Rheum arthritis Mother   . Asthma Mother   . Parkinson's disease Mother   . Heart disease Mother   . Stroke Mother   . Hypertension Mother   . Heart attack Father   . Heart disease Father   . Hypertension Father   . Diabetes Son   . Asthma Sister   . Heart disease Sister   . Lung cancer Sister   . Heart disease Sister   . Heart disease Sister   . Breast cancer Sister   . Heart  attack Sister   . Heart disease Brother   . Heart disease Maternal Grandmother   . Diabetes Maternal Grandmother   . Colon cancer Maternal Grandmother   . Heart disease Brother   . Alcohol abuse Brother   . Depression Brother     Social History:   Social History   Social History  . Marital status: Married    Spouse name: N/A  . Number of children: 2  . Years of education: Some Coll   Occupational History  . Retired    Social History Main Topics  . Smoking status: Former Smoker    Packs/day: 0.50    Years: 20.00    Types: Cigarettes    Quit date: 02/02/1974  . Smokeless tobacco: Never Used  . Alcohol use No  . Drug use: No  .  Sexual activity: Not Currently   Other Topics Concern  . None   Social History Narrative   Lives at home in [place] with her husband and grandson.   Right-handed.   6 cups coffee per day.    Additional Social History:  Married Lives with husband, grand son and his fiance.   Allergies:   Allergies  Allergen Reactions  . Ceftin [Cefuroxime Axetil] Anaphylaxis    REACTION: tongue and throat swell  . Lisinopril Anaphylaxis    REACTION: tongue and throat swelling (onset 10-10-09)  . Antihistamines, Chlorpheniramine-Type     REACTION: makes pt hyper  . Antivert [Meclizine Hcl]     REACTION: bladder will not empty  . Decongestant [Pseudoephedrine Hcl]     REACTION: makes pt hyper  . Doxycycline     REACTION: GI upset  . Morphine And Related Other (See Comments)    Per patient, low blood pressure issues, following morphine, that requires action to raise it back up.  . Sulfa Antibiotics     Face swelling  . Xarelto [Rivaroxaban]     GI bleed  . Adhesive [Tape] Rash  . Iodine Hives and Rash  . Levaquin [Levofloxacin In D5w] Rash  . Tetanus Toxoids     REACTION: rash, fever, hot to touch at injection site    Metabolic Disorder Labs: Lab Results  Component Value Date   HGBA1C 5.5 09/27/2015   No results found for: PROLACTIN No results found for: CHOL, TRIG, HDL, CHOLHDL, VLDL, LDLCALC   Current Medications: Current Outpatient Prescriptions  Medication Sig Dispense Refill  . albuterol (VENTOLIN HFA) 108 (90 BASE) MCG/ACT inhaler Inhale 2 puffs into the lungs every 6 (six) hours as needed. (Patient taking differently: Inhale 2 puffs into the lungs every 6 (six) hours as needed for wheezing or shortness of breath. ) 1 Inhaler 1  . aspirin 81 MG tablet Take 81 mg by mouth daily.    . budesonide-formoterol (SYMBICORT) 160-4.5 MCG/ACT inhaler Inhale 2 puffs into the lungs 2 (two) times daily. 1 Inhaler 2  . escitalopram (LEXAPRO) 10 MG tablet Take 1 tablet (10 mg total) by  mouth daily after breakfast. 30 tablet 1  . folic acid (FOLVITE) 1 MG tablet Take 1 mg by mouth daily.    . furosemide (LASIX) 20 MG tablet take 1 tablet by mouth once daily 30 tablet 4  . gabapentin (NEURONTIN) 300 MG capsule Take 1 capsule (300 mg total) by mouth 4 (four) times daily. (Patient taking differently: Take 300 mg by mouth 3 (three) times daily. ) 120 capsule 2  . lamoTRIgine (LAMICTAL) 100 MG tablet Take 1 tablet (100 mg total) by mouth 2 (two)  times daily. 60 tablet 2  . LORazepam (ATIVAN) 0.5 MG tablet Take 1 tablet (0.5 mg total) by mouth at bedtime. 30 tablet 1  . lovastatin (ALTOPREV) 20 MG 24 hr tablet Take 20 mg by mouth at bedtime.    . methotrexate 50 MG/2ML injection Inject 250 mg into the skin once a week. Inject 10 ml once weekly. Patient takes on Monday.    . montelukast (SINGULAIR) 10 MG tablet take 1 tablet by mouth once daily 30 tablet 3  . multivitamin-lutein (OCUVITE-LUTEIN) CAPS capsule Take 1 capsule by mouth 2 (two) times daily.    . nebivolol (BYSTOLIC) 10 MG tablet Take 1 tablet (10 mg total) by mouth daily. (Patient taking differently: Take 5 mg by mouth daily. ) 30 tablet 3  . oxyCODONE-acetaminophen (ROXICET) 5-325 MG tablet Take 1 tablet by mouth every 8 (eight) hours as needed for severe pain. 10 tablet 0  . pantoprazole (PROTONIX) 40 MG tablet Take 40 mg by mouth 2 (two) times daily.     . potassium chloride (K-DUR) 10 MEQ tablet take 1 tablet by mouth once daily 30 tablet 4  . QUEtiapine (SEROQUEL) 25 MG tablet Take 1 tablet (25 mg total) by mouth at bedtime. 30 tablet 0  . ranitidine (ZANTAC) 150 MG tablet Take 150 mg by mouth 2 (two) times daily.     No current facility-administered medications for this visit.     Neurologic: Headache: No Seizure: No Paresthesias:No  Musculoskeletal: Strength & Muscle Tone: within normal limits Gait & Station: normal Patient leans: N/A  Psychiatric Specialty Exam: ROS  Blood pressure 116/65, pulse 69,  temperature 98.4 F (36.9 C), temperature source Oral, height 5\' 4"  (1.626 m), weight 155 lb 6.4 oz (70.5 kg).Body mass index is 26.67 kg/m.  General Appearance: Casual  Eye Contact:  Fair  Speech:  Clear and Coherent  Volume:  Normal  Mood:  Anxious and Depressed  Affect:  Appropriate  Thought Process:  Coherent  Orientation:  Full (Time, Place, and Person)  Thought Content:  WDL  Suicidal Thoughts:  No  Homicidal Thoughts:  No  Memory:  Immediate;   Fair Recent;   Fair Remote;   Fair  Judgement:  Fair  Insight:  Fair  Psychomotor Activity:  Normal  Concentration:  Concentration: Fair and Attention Span: Fair  Recall:  [employer] of Knowledge:Fair  Language: Fair  Akathisia:  No  Handed:  Right  AIMS (if indicated):    Assets:  Communication Skills Desire for Improvement Physical Health  ADL's:  Intact  Cognition: WNL  Sleep:  poor    Treatment Plan Summary: Medication management   Discussed with patient about her medications. I will adjust her medications as follows.  Continue lamotrigine 100 mg by mouth twice a day Increase Lexapro 10 mg every morning Decrease lorazepam 0.5 mg by mouth daily at bedtime Continue  Seroquel 25 mg by mouth daily at bedtime for mood symptoms and depression. Discussed with her about the side effects in detail and she demonstrated understanding.  Follow-up in 4 weeks or earlier depending on her symptoms.   More than 50% of the time spent in psychoeducation, counseling and coordination of care.    This note was generated in part or whole with voice recognition software. Voice regonition is usually quite accurate but there are transcription errors that can and very often do occur. I apologize for any typographical errors that were not detected and corrected.    Rainey Pines, MD 9/13/201712:17 PM

## 2015-10-21 ENCOUNTER — Other Ambulatory Visit: Payer: Self-pay | Admitting: Family Medicine

## 2015-10-21 NOTE — Telephone Encounter (Signed)
Historical medication. Pt last seen 10/03/15. Pt has not had a lipid panel done from what I am able to see. Please advise?

## 2015-10-22 ENCOUNTER — Telehealth: Payer: Self-pay | Admitting: *Deleted

## 2015-10-22 ENCOUNTER — Other Ambulatory Visit: Payer: Self-pay | Admitting: Family Medicine

## 2015-10-22 MED ORDER — LOVASTATIN 20 MG PO TABS: 20.0000 mg | ORAL_TABLET | Freq: Every day | ORAL | 3 refills | Status: DC

## 2015-10-22 MED ORDER — LOVASTATIN ER 20 MG PO TB24: 20.0000 mg | ORAL_TABLET | Freq: Every day | ORAL | 2 refills | Status: DC

## 2015-10-22 NOTE — Telephone Encounter (Signed)
Please resend Rx for lovastatin regular.   Lovastatin extended release will require a prior authorization.

## 2015-10-22 NOTE — Telephone Encounter (Signed)
[facility] received a Rx for lovastatin extended release, this Rx should have been for the regular.Extended release will not dispense in generic.

## 2015-10-28 ENCOUNTER — Ambulatory Visit: Payer: Medicare Other | Admitting: Neurology

## 2015-11-01 ENCOUNTER — Ambulatory Visit: Payer: Medicare Other | Attending: Family Medicine

## 2015-11-08 ENCOUNTER — Ambulatory Visit: Payer: 59 | Admitting: Licensed Clinical Social Worker

## 2015-11-12 ENCOUNTER — Telehealth: Payer: Self-pay

## 2015-11-12 NOTE — Telephone Encounter (Signed)
Pt coming for labs 11/14/15. Please place future order. Thank you. Looks like we are re-checking iron studies.

## 2015-11-13 ENCOUNTER — Ambulatory Visit: Payer: 59 | Admitting: Psychiatry

## 2015-11-13 ENCOUNTER — Other Ambulatory Visit: Payer: Self-pay | Admitting: Family Medicine

## 2015-11-13 DIAGNOSIS — D638 Anemia in other chronic diseases classified elsewhere: Secondary | ICD-10-CM

## 2015-11-14 ENCOUNTER — Other Ambulatory Visit (INDEPENDENT_AMBULATORY_CARE_PROVIDER_SITE_OTHER): Payer: Medicare Other

## 2015-11-14 DIAGNOSIS — D638 Anemia in other chronic diseases classified elsewhere: Secondary | ICD-10-CM | POA: Diagnosis not present

## 2015-11-14 LAB — FERRITIN: Ferritin: 48.5 ng/mL (ref 10.0–291.0)

## 2015-11-14 LAB — CBC
HCT: 34.8 % — ABNORMAL LOW (ref 36.0–46.0)
Hemoglobin: 11.5 g/dL — ABNORMAL LOW (ref 12.0–15.0)
MCHC: 33.1 g/dL (ref 30.0–36.0)
MCV: 90.6 fl (ref 78.0–100.0)
Platelets: 297 10*3/uL (ref 150.0–400.0)
RBC: 3.83 Mil/uL — ABNORMAL LOW (ref 3.87–5.11)
RDW: 17.6 % — ABNORMAL HIGH (ref 11.5–15.5)
WBC: 8.4 10*3/uL (ref 4.0–10.5)

## 2015-11-14 LAB — IRON AND TIBC
%SAT: 18 % (ref 11–50)
Iron: 61 ug/dL (ref 45–160)
TIBC: 330 ug/dL (ref 250–450)
UIBC: 269 ug/dL (ref 125–400)

## 2015-11-23 ENCOUNTER — Other Ambulatory Visit: Payer: Self-pay | Admitting: Internal Medicine

## 2015-11-25 ENCOUNTER — Ambulatory Visit (INDEPENDENT_AMBULATORY_CARE_PROVIDER_SITE_OTHER)
Admission: RE | Admit: 2015-11-25 | Discharge: 2015-11-25 | Disposition: A | Discharge status: Home / Self Care | Payer: Medicare Other | Source: Ambulatory Visit | Attending: Pulmonary Disease | Admitting: Pulmonary Disease

## 2015-11-25 ENCOUNTER — Encounter: Payer: Self-pay | Admitting: Pulmonary Disease

## 2015-11-25 ENCOUNTER — Other Ambulatory Visit: Payer: Self-pay | Admitting: Psychiatry

## 2015-11-25 ENCOUNTER — Encounter (INDEPENDENT_AMBULATORY_CARE_PROVIDER_SITE_OTHER): Payer: Medicare Other | Admitting: Pulmonary Disease

## 2015-11-25 ENCOUNTER — Ambulatory Visit (INDEPENDENT_AMBULATORY_CARE_PROVIDER_SITE_OTHER): Payer: Medicare Other | Admitting: Pulmonary Disease

## 2015-11-25 VITALS — BP 112/68 | HR 66 | Ht 64.0 in | Wt 156.0 lb

## 2015-11-25 DIAGNOSIS — J432 Centrilobular emphysema: Secondary | ICD-10-CM

## 2015-11-25 DIAGNOSIS — R05 Cough: Secondary | ICD-10-CM

## 2015-11-25 DIAGNOSIS — Z5181 Encounter for therapeutic drug level monitoring: Secondary | ICD-10-CM

## 2015-11-25 DIAGNOSIS — R059 Cough, unspecified: Secondary | ICD-10-CM

## 2015-11-25 LAB — PULMONARY FUNCTION TEST
DL/VA % pred: 87 %
DL/VA: 4.19 ml/min/mmHg/L
DLCO cor % pred: 74 %
DLCO cor: 18.19 ml/min/mmHg
DLCO unc % pred: 72 %
DLCO unc: 17.61 ml/min/mmHg
FEF 25-75 Post: 2.05 L/sec
FEF 25-75 Pre: 0.94 L/sec
FEF2575-%Change-Post: 118 %
FEF2575-%Pred-Post: 117 %
FEF2575-%Pred-Pre: 53 %
FEV1-%Change-Post: 20 %
FEV1-%Pred-Post: 94 %
FEV1-%Pred-Pre: 78 %
FEV1-Post: 2.06 L
FEV1-Pre: 1.71 L
FEV1FVC-%Change-Post: 15 %
FEV1FVC-%Pred-Pre: 91 %
FEV6-%Change-Post: 4 %
FEV6-%Pred-Post: 94 %
FEV6-%Pred-Pre: 89 %
FEV6-Post: 2.59 L
FEV6-Pre: 2.47 L
FEV6FVC-%Change-Post: 0 %
FEV6FVC-%Pred-Post: 103 %
FEV6FVC-%Pred-Pre: 103 %
FVC-%Change-Post: 4 %
FVC-%Pred-Post: 90 %
FVC-%Pred-Pre: 87 %
FVC-Post: 2.62 L
FVC-Pre: 2.51 L
Post FEV1/FVC ratio: 79 %
Post FEV6/FVC ratio: 99 %
Pre FEV1/FVC ratio: 68 %
Pre FEV6/FVC Ratio: 98 %
RV % pred: 116 %
RV: 2.62 L
TLC % pred: 101 %
TLC: 5.14 L

## 2015-11-25 MED ORDER — ALBUTEROL SULFATE HFA 108 (90 BASE) MCG/ACT IN AERS: 2.0000 | INHALATION_SPRAY | Freq: Four times a day (QID) | RESPIRATORY_TRACT | 5 refills | Status: DC | PRN

## 2015-11-25 NOTE — Progress Notes (Signed)
Subjective:    Patient ID: Kerri Carter, female    DOB: [birth year], 75 y.o.   MRN: [id number]  Synopsis: Kerri Carter established care for COPD at the [facility] office in 08/2011.  Had PFTs in 04/2011 in March 2013 which showed: Ratio 70%, clear obstruction on flow volume loop, FEV1 1.79 L (81% predicted.), Total lung capacity 5.3 L (115% predicted), residual volume 2.49 L (136% predicted), DLCO 84% predicted. She smoked 0.5packs per day for 20 years.    HPI  Chief Complaint  Patient presents with  . Follow-up    Pt here after PFT. Pt states her SOB is unchanged since last OV. Pt c/o slight increase in dry cough. Pt denies CP/tightness and f/c/s. Pt states she no longer wants to take Symbicort because it breaks her mouth out even after rinsing.     Cough> started two days ago.  Occurs rarely, it is dry.  No associated shortness of breath.  COPD> No bronchitis or flare ups since the last visit.  She said that the symbicort made her mouth break out.  She stopped taking it.  She is interested in taking Breo or Spiriva.  She has had a flu shot in September at [facility].  Rheumatoid Arthritis> continues to take methotrexate and the arthritis symptoms are much better controlled.   Past Medical History:  Diagnosis Date  . Anxiety   . Chicken pox   . CKD (chronic kidney disease), stage III    a. s/p R nephrectomy.  . Conversion disorder   . COPD (chronic obstructive pulmonary disease) ([place])   . Depression   . DM type 2 (diabetes mellitus, type 2) ([place])   . Essential hypertension   . GERD (gastroesophageal reflux disease)   . Hyperlipidemia   . Inflammatory arthritis    a. hands/carpal tunnel.  b. Low titer rheumatoid factor. c. Negative anti-CCP antibodies. d. Plaquenil.  . Non-Obstructive CAD    a. 07/2009 Cath ([facility]): nonobs dzs;  b. 03/2011 Cath [facility]): nonobs dzs.  . Osteoarthritis    a. Knees.  . PUD (peptic ulcer disease)   . Toxic maculopathy   . Valvular heart disease    a.  07/2015 Echo: EF 55-60%, Mild AI, AS, MR, and TR.     Review of Systems  Constitutional: Negative for activity change, chills and fatigue.  Respiratory: Positive for cough, shortness of breath and wheezing. Negative for stridor.   Cardiovascular: Negative for chest pain, palpitations and leg swelling.       Objective:   Physical Exam  Vitals:   11/25/15 1419  BP: 112/68  BP Location: Left Arm  Cuff Size: Normal  Pulse: 66  SpO2: 94%  Weight: 156 lb (70.8 kg)  Height: 5\' 4"  (1.626 m)  RA  Gen: chronically  ill appearing, no acute distress HEENT: NCAT, EOMi, OP clear,  PULM: No wheezing on my exam, normal effort CV: RRR, no mgr, no JVD AB: BS+, soft, nontender, no hsm Ext: warm, no edema, no clubbing, no cyanosis  Primary care note reviewed where she was seen for chest pain, otitis externa. Narcotics prescribed.  CBC    Component Value Date/Time   WBC 8.4 11/14/2015 1357   RBC 3.83 (L) 11/14/2015 1357   HGB 11.5 (L) 11/14/2015 1357   HGB 11.6 (L) 11/24/2013 1622   HCT 34.8 (L) 11/14/2015 1357   HCT 37.0 11/24/2013 1622   PLT 297.0 11/14/2015 1357   PLT 188 11/24/2013 1622   MCV 90.6 11/14/2015 1357  MCV 85 11/24/2013 1622   MCH 31.0 09/27/2015 0501   MCHC 33.1 11/14/2015 1357   RDW 17.6 (H) 11/14/2015 1357   RDW 14.5 11/24/2013 1622   LYMPHSABS 2.7 09/26/2015 1558   LYMPHSABS 1.4 07/22/2013 0549   MONOABS 0.2 09/26/2015 1558   MONOABS 0.8 07/22/2013 0549   EOSABS 0.2 09/26/2015 1558   EOSABS 0.3 07/22/2013 0549   BASOSABS 0.1 09/26/2015 1558   BASOSABS 0.0 07/22/2013 0549        Assessment & Plan:   COPD (chronic obstructive pulmonary disease) ([place]) This has been a stable interval for her. She has not had an exacerbation since the last visit. Her flu shot is up-to-date. However, she tends to have exacerbations of her COPD (with asthma overlap syndrome) in the wintertime so she needs to be on a controller medicine right now. Symbicort caused mouth  sores.  Plan: Trial of Breo, samples given, she will call for a prescription if it is effective Use albuterol as needed, refill today Follow-up 6 months  Rheumatoid arthritis ([place]) Today's lung function test was reviewed, fortunately the DLCO and FVC have not changed I see no evidence of an underlying evidence of interstitial lung disease caused by methotrexate. However, she has been experiencing a cough recently. Given the lung function test findings it's unlikely that this is related to the methotrexate.  Plan: We will check a chest x-ray for completeness sake in the setting of a new cough Continue every six-month pulmonary function test considering her ongoing methotrexate use with concomitant lung disease   Updated Medication List Outpatient Encounter Prescriptions as of 11/25/2015  Medication Sig Dispense Refill  . albuterol (VENTOLIN HFA) 108 (90 Base) MCG/ACT inhaler Inhale 2 puffs into the lungs every 6 (six) hours as needed. 1 Inhaler 5  . aspirin 81 MG tablet Take 81 mg by mouth daily.    . budesonide-formoterol (SYMBICORT) 160-4.5 MCG/ACT inhaler Inhale 2 puffs into the lungs 2 (two) times daily. 1 Inhaler 2  . escitalopram (LEXAPRO) 10 MG tablet Take 1 tablet (10 mg total) by mouth daily after breakfast. 30 tablet 1  . folic acid (FOLVITE) 1 MG tablet Take 1 mg by mouth daily.    . furosemide (LASIX) 20 MG tablet take 1 tablet by mouth once daily 30 tablet 4  . gabapentin (NEURONTIN) 300 MG capsule Take 1 capsule (300 mg total) by mouth 4 (four) times daily. (Patient taking differently: Take 300 mg by mouth 3 (three) times daily. ) 120 capsule 2  . lamoTRIgine (LAMICTAL) 100 MG tablet Take 1 tablet (100 mg total) by mouth 2 (two) times daily. 60 tablet 2  . LORazepam (ATIVAN) 0.5 MG tablet Take 1 tablet (0.5 mg total) by mouth at bedtime. 30 tablet 1  . lovastatin (MEVACOR) 20 MG tablet Take 1 tablet (20 mg total) by mouth at bedtime. 90 tablet 3  . methotrexate 50 MG/2ML  injection Inject 250 mg into the skin once a week. Inject 10 ml once weekly. Patient takes on Monday.    . montelukast (SINGULAIR) 10 MG tablet take 1 tablet by mouth once daily 30 tablet 3  . multivitamin-lutein (OCUVITE-LUTEIN) CAPS capsule Take 1 capsule by mouth 2 (two) times daily.    . nebivolol (BYSTOLIC) 10 MG tablet Take 1 tablet (10 mg total) by mouth daily. (Patient taking differently: Take 5 mg by mouth daily. ) 30 tablet 3  . oxyCODONE-acetaminophen (ROXICET) 5-325 MG tablet Take 1 tablet by mouth every 8 (eight) hours as needed for  severe pain. 10 tablet 0  . pantoprazole (PROTONIX) 40 MG tablet Take 40 mg by mouth 2 (two) times daily.     . potassium chloride (K-DUR) 10 MEQ tablet take 1 tablet by mouth once daily 30 tablet 4  . QUEtiapine (SEROQUEL) 25 MG tablet Take 1 tablet (25 mg total) by mouth at bedtime. 30 tablet 0  . ranitidine (ZANTAC) 150 MG tablet Take 150 mg by mouth 2 (two) times daily.    . [DISCONTINUED] albuterol (VENTOLIN HFA) 108 (90 BASE) MCG/ACT inhaler Inhale 2 puffs into the lungs every 6 (six) hours as needed. (Patient taking differently: Inhale 2 puffs into the lungs every 6 (six) hours as needed for wheezing or shortness of breath. ) 1 Inhaler 1   No facility-administered encounter medications on file as of 11/25/2015.

## 2015-11-25 NOTE — Assessment & Plan Note (Signed)
This has been a stable interval for her. She has not had an exacerbation since the last visit. Her flu shot is up-to-date. However, she tends to have exacerbations of her COPD (with asthma overlap syndrome) in the wintertime so she needs to be on a controller medicine right now. Symbicort caused mouth sores.  Plan: Trial of Breo, samples given, she will call for a prescription if it is effective Use albuterol as needed, refill today Follow-up 6 months

## 2015-11-25 NOTE — Assessment & Plan Note (Signed)
Today's lung function test was reviewed, fortunately the DLCO and FVC have not changed I see no evidence of an underlying evidence of interstitial lung disease caused by methotrexate. However, she has been experiencing a cough recently. Given the lung function test findings it's unlikely that this is related to the methotrexate.  Plan: We will check a chest x-ray for completeness sake in the setting of a new cough Continue every six-month pulmonary function test considering her ongoing methotrexate use with concomitant lung disease

## 2015-11-25 NOTE — Patient Instructions (Signed)
Try taking Breo 1 puff daily. Call me in one week to let me know if it is working. If this is not effective then we can try using Spiriva instead. Use albuterol as needed for shortness of breath We will call you with the results of the chest x-ray I will see you back in 6 months with a lung function test

## 2015-11-26 ENCOUNTER — Ambulatory Visit
Admission: RE | Admit: 2015-11-26 | Discharge: 2015-11-26 | Disposition: A | Discharge status: Home / Self Care | Payer: Medicare Other | Source: Ambulatory Visit | Attending: Family Medicine | Admitting: Family Medicine

## 2015-11-26 DIAGNOSIS — Z1231 Encounter for screening mammogram for malignant neoplasm of breast: Secondary | ICD-10-CM | POA: Diagnosis present

## 2015-11-26 DIAGNOSIS — Z1239 Encounter for other screening for malignant neoplasm of breast: Secondary | ICD-10-CM

## 2015-11-28 ENCOUNTER — Other Ambulatory Visit: Payer: Self-pay | Admitting: Family Medicine

## 2015-11-28 DIAGNOSIS — N6310 Unspecified lump in the right breast, unspecified quadrant: Secondary | ICD-10-CM

## 2015-11-28 DIAGNOSIS — N6320 Unspecified lump in the left breast, unspecified quadrant: Secondary | ICD-10-CM

## 2015-11-30 ENCOUNTER — Other Ambulatory Visit: Payer: Self-pay | Admitting: Family Medicine

## 2015-12-02 ENCOUNTER — Other Ambulatory Visit: Payer: Self-pay | Admitting: Family Medicine

## 2015-12-03 IMAGING — CT CT CHEST HIGH RESOLUTION W/O CM
3 of 11 series · 12 of 36 positions shown, 13 images · non-contrast
Comparison: Chest CT 03/21/2011.

CLINICAL DATA: 74-year-old female with recent diagnosis of
rheumatoid arthritis on methotrexate presenting with shortness of
breath. Pulmonary function tests suggest small airways disease.

EXAM:
CT CHEST WITHOUT CONTRAST
TECHNIQUE: Multidetector CT imaging of the chest was performed following the
standard protocol without intravenous contrast. High resolution
imaging of the lungs, as well as inspiratory and expiratory imaging,
was performed.

[Series 6: lung · axial · 0.66mm/px · z∈[-214,-64]mm · 4 of 50 slices shown, 5 images]
[im 10/50  mediastinal]
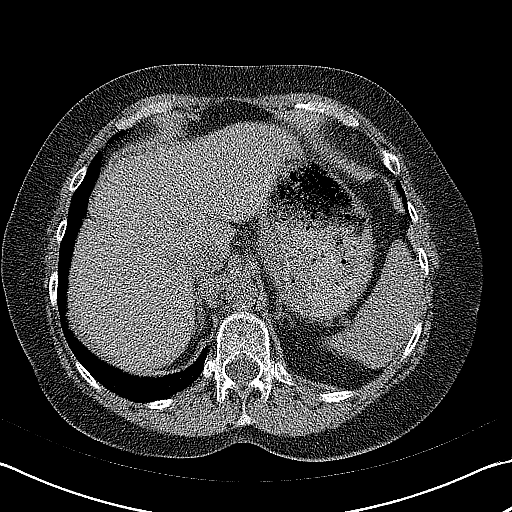
[im 10/50  lung]
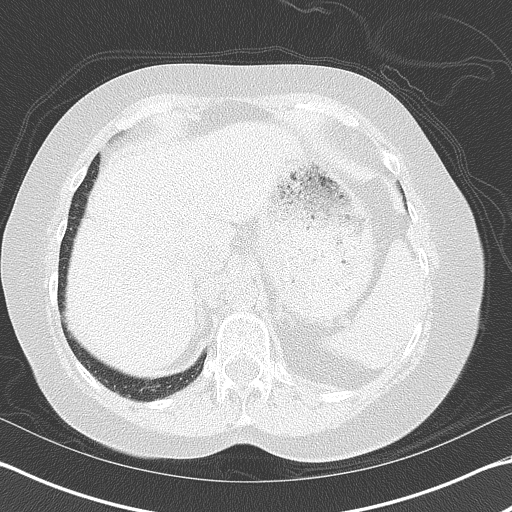
[im 20/50  lung]
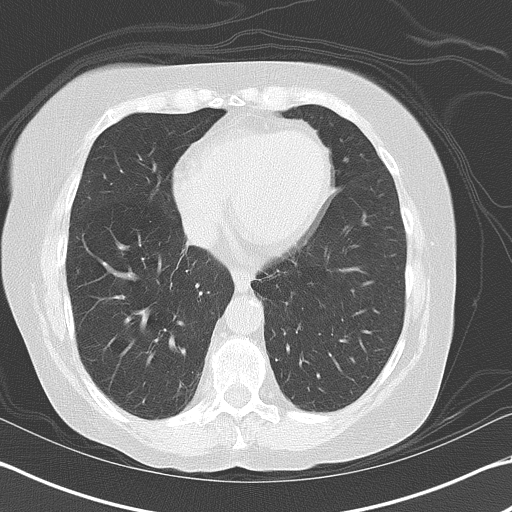
[im 30/50  lung]
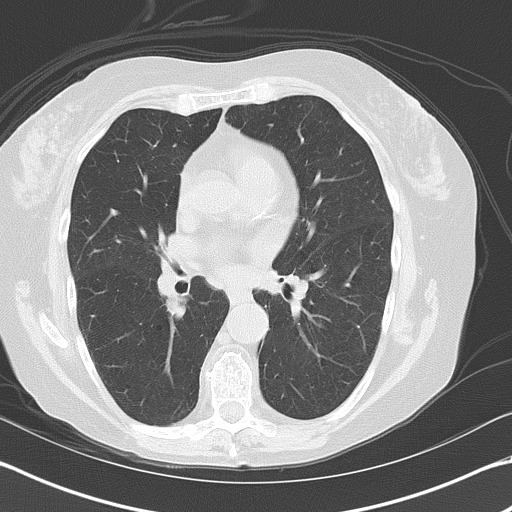
[im 40/50  lung]
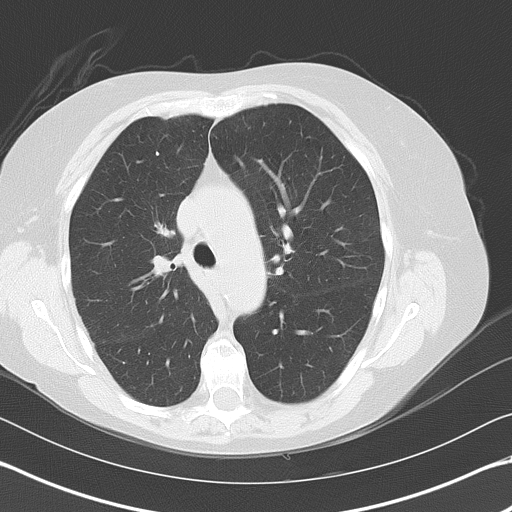

[Series 12: thins for (person_name) & mpr · axial · 0.66mm/px · z∈[-43,+4]mm · 7 of 79 slices shown]
[im 10/79  lung]
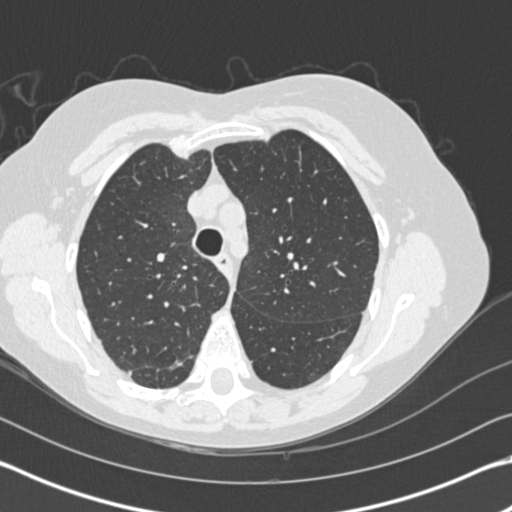
[im 20/79  lung]
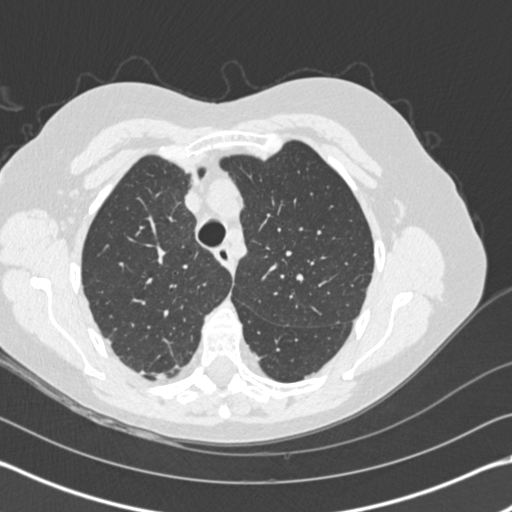
[im 30/79  lung]
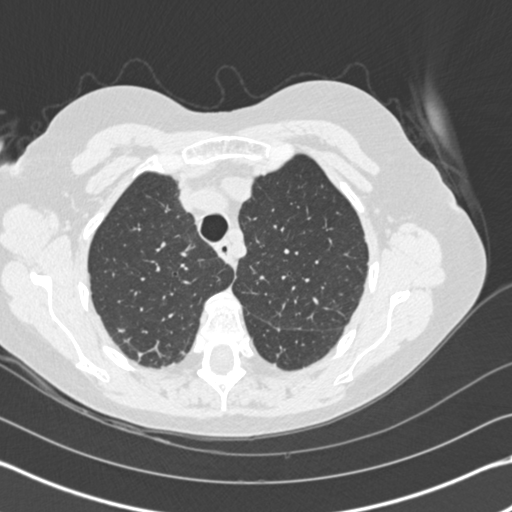
[im 40/79  lung]
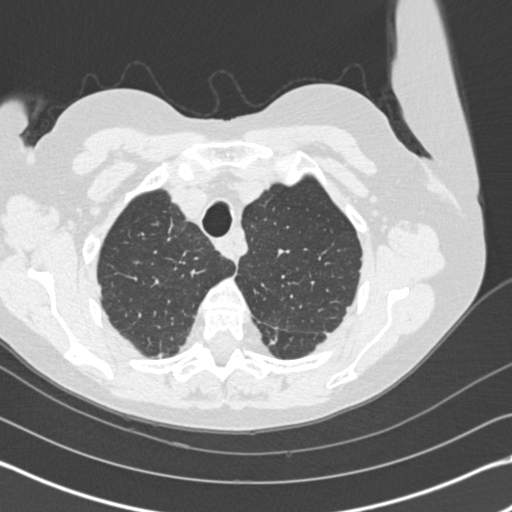
[im 49/79  lung]
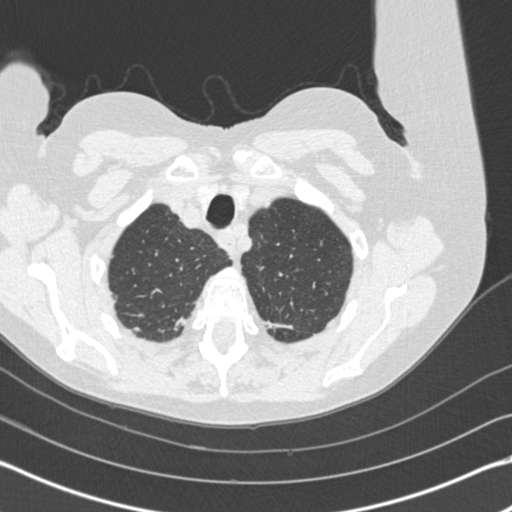
[im 59/79  lung]
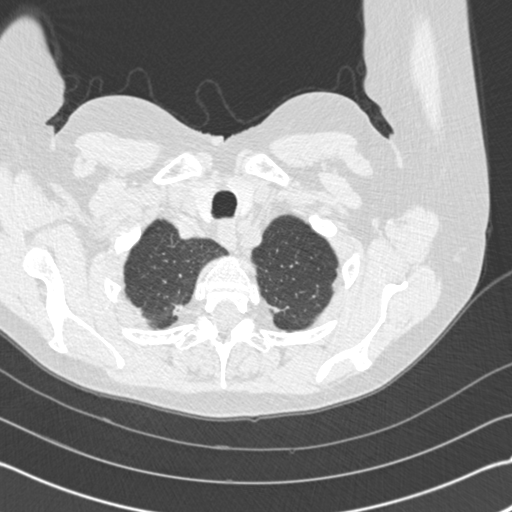
[im 69/79  lung]
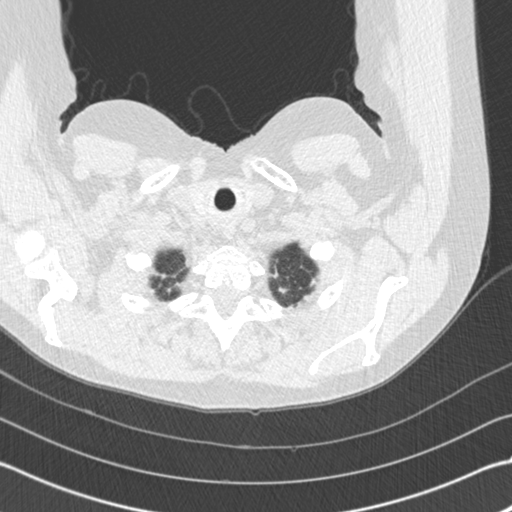

[Series 602: cor · coronal · 0.66mm/px · 1 of 126 slices shown]
[im 63/126  lung]
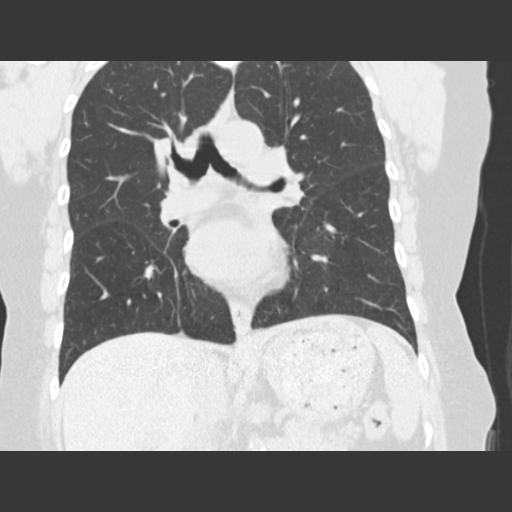

[12 of 36 positions shown; findings below may reference images not displayed]

FINDINGS: Mediastinum/Lymph Nodes: Heart size is normal. There is no
significant pericardial fluid, thickening or pericardial
calcification. There is atherosclerosis of the thoracic aorta, the
great vessels of the mediastinum and the coronary arteries,
including calcified atherosclerotic plaque in the left anterior
descending and right coronary arteries. No pathologically enlarged
mediastinal or hilar lymph nodes. Please note that accurate
exclusion of hilar adenopathy is limited on noncontrast CT scans.
Esophagus is unremarkable in appearance. No axillary
lymphadenopathy.

Lungs/Pleura: 5 mm left upper lobe pulmonary nodule (image 15 of
series 6), unchanged compared to prior study 03/21/2011, considered
benign. No other suspicious appearing pulmonary nodules or masses.
Mild bilateral apical pleuroparenchymal thickening, similar to the
prior examination, most compatible with chronic post infectious or
inflammatory scarring. High-resolution images demonstrate no
significant regions of ground-glass attenuation, subpleural
reticulation, parenchymal banding or traction bronchiectasis to
indicate underlying interstitial lung disease. Inspiratory and
expiratory images demonstrate extensive air trapping, indicative of
small airways disease. No acute consolidative airspace disease. No
pleural effusions.

Upper abdomen: Extensive atherosclerosis in the visualized
vasculature.

Musculoskeletal: There are no aggressive appearing lytic or blastic
lesions noted in the visualized portions of the skeleton.
IMPRESSION: 1. No findings to suggest interstitial lung disease.
2. Extensive air trapping, indicative of small airways disease.
3. 5 mm nodule in the left upper lobe is unchanged compared to prior
study from 5845 and considered benign. No imaging follow-up is
recommended.
4. Atherosclerosis, including 2 vessel coronary artery disease.
Assessment for potential risk factor modification, dietary therapy
or pharmacologic therapy may be warranted, if clinically indicated.

## 2015-12-06 ENCOUNTER — Telehealth: Payer: Self-pay | Admitting: Pulmonary Disease

## 2015-12-06 NOTE — Telephone Encounter (Signed)
BQ please advise which breo you would like to send in for the pt.  She stated that this worked for her.  Please advise thanks

## 2015-12-09 MED ORDER — FLUTICASONE FUROATE-VILANTEROL 100-25 MCG/INH IN AEPB: 1.0000 | INHALATION_SPRAY | Freq: Every day | RESPIRATORY_TRACT | 5 refills | Status: AC

## 2015-12-09 NOTE — Telephone Encounter (Signed)
100 daily

## 2015-12-09 NOTE — Telephone Encounter (Signed)
LM to inform pt that Rx has been sent to preferred pharmacy. Advise pt to give [place] a call back with any questions or concerns. Nothing further needed.

## 2015-12-11 ENCOUNTER — Ambulatory Visit: Payer: 59 | Admitting: Licensed Clinical Social Worker

## 2015-12-13 ENCOUNTER — Ambulatory Visit
Admission: RE | Admit: 2015-12-13 | Discharge: 2015-12-13 | Disposition: A | Discharge status: Home / Self Care | Payer: Medicare Other | Source: Ambulatory Visit | Attending: Family Medicine | Admitting: Family Medicine

## 2015-12-13 DIAGNOSIS — N6310 Unspecified lump in the right breast, unspecified quadrant: Secondary | ICD-10-CM

## 2015-12-13 DIAGNOSIS — N6320 Unspecified lump in the left breast, unspecified quadrant: Secondary | ICD-10-CM | POA: Insufficient documentation

## 2015-12-20 ENCOUNTER — Telehealth: Payer: Self-pay | Admitting: *Deleted

## 2015-12-20 ENCOUNTER — Other Ambulatory Visit: Payer: Self-pay | Admitting: Psychiatry

## 2015-12-20 NOTE — Telephone Encounter (Signed)
I called in the Lorazepam

## 2015-12-20 NOTE — Telephone Encounter (Signed)
Med refilled.

## 2015-12-24 ENCOUNTER — Emergency Department
Admission: EM | Admit: 2015-12-24 | Discharge: 2015-12-24 | Disposition: A | Discharge status: Home / Self Care | Payer: Medicare Other | Attending: Emergency Medicine | Admitting: Emergency Medicine

## 2015-12-24 ENCOUNTER — Emergency Department: Payer: Medicare Other

## 2015-12-24 ENCOUNTER — Encounter: Payer: Self-pay | Admitting: Emergency Medicine

## 2015-12-24 DIAGNOSIS — Y92009 Unspecified place in unspecified non-institutional (private) residence as the place of occurrence of the external cause: Secondary | ICD-10-CM | POA: Insufficient documentation

## 2015-12-24 DIAGNOSIS — S93602A Unspecified sprain of left foot, initial encounter: Secondary | ICD-10-CM | POA: Diagnosis not present

## 2015-12-24 DIAGNOSIS — J449 Chronic obstructive pulmonary disease, unspecified: Secondary | ICD-10-CM | POA: Diagnosis not present

## 2015-12-24 DIAGNOSIS — Y999 Unspecified external cause status: Secondary | ICD-10-CM | POA: Insufficient documentation

## 2015-12-24 DIAGNOSIS — W010XXA Fall on same level from slipping, tripping and stumbling without subsequent striking against object, initial encounter: Secondary | ICD-10-CM | POA: Insufficient documentation

## 2015-12-24 DIAGNOSIS — E1122 Type 2 diabetes mellitus with diabetic chronic kidney disease: Secondary | ICD-10-CM | POA: Diagnosis not present

## 2015-12-24 DIAGNOSIS — Z87891 Personal history of nicotine dependence: Secondary | ICD-10-CM | POA: Insufficient documentation

## 2015-12-24 DIAGNOSIS — N183 Chronic kidney disease, stage 3 (moderate): Secondary | ICD-10-CM | POA: Diagnosis not present

## 2015-12-24 DIAGNOSIS — S99922A Unspecified injury of left foot, initial encounter: Secondary | ICD-10-CM | POA: Diagnosis present

## 2015-12-24 DIAGNOSIS — I129 Hypertensive chronic kidney disease with stage 1 through stage 4 chronic kidney disease, or unspecified chronic kidney disease: Secondary | ICD-10-CM | POA: Diagnosis not present

## 2015-12-24 DIAGNOSIS — Y939 Activity, unspecified: Secondary | ICD-10-CM | POA: Insufficient documentation

## 2015-12-24 MED ORDER — HYDROCODONE-ACETAMINOPHEN 5-325 MG PO TABS: 1.0000 | ORAL_TABLET | Freq: Three times a day (TID) | ORAL | 0 refills | Status: DC | PRN

## 2015-12-24 NOTE — Discharge Instructions (Signed)
Your exam and x-ray show a sprain to the left foot. You should wear the ace bandage as needed for comfort. Rest, ice and elevate the foot when seated. Follow-up with Dr. Jacqualyn Posey for continued problems.

## 2015-12-24 NOTE — ED Provider Notes (Signed)
[facility] Emergency Department Provider Note ____________________________________________  Time seen: 1606  I have reviewed the triage vital signs and the nursing notes.  HISTORY  Chief Complaint  Fall  HPI Kerri Carter is a 75 y.o. female presents to the ED for evaluation of pain to the left foot and ankle after a mechanical fall at home. Patient describes cleaning the house today wearing her orthopedic shoes and her right ankle brace. She describes tripping over the vacuum cleaner cord, which causes her rolled her left ankle. She denies any head injury, loss of consciousness, or lacerations. She localizes the pain to the dorsal aspect of the left foot.  Past Medical History:  Diagnosis Date  . Anxiety   . Chicken pox   . CKD (chronic kidney disease), stage III    a. s/p R nephrectomy.  . Conversion disorder   . COPD (chronic obstructive pulmonary disease) ([place])   . Depression   . DM type 2 (diabetes mellitus, type 2) ([place])   . Essential hypertension   . GERD (gastroesophageal reflux disease)   . Hyperlipidemia   . Inflammatory arthritis    a. hands/carpal tunnel.  b. Low titer rheumatoid factor. c. Negative anti-CCP antibodies. d. Plaquenil.  . Non-Obstructive CAD    a. 07/2009 Cath ([facility]): nonobs dzs;  b. 03/2011 Cath [facility]): nonobs dzs.  . Osteoarthritis    a. Knees.  . PUD (peptic ulcer disease)   . Toxic maculopathy   . Valvular heart disease    a. 07/2015 Echo: EF 55-60%, Mild AI, AS, MR, and TR.    Patient Active Problem List   Diagnosis Date Noted  . Mixed bipolar I disorder ([place]) 10/09/2015  . Otitis externa 10/03/2015  . CKD (chronic kidney disease), stage III   . Essential hypertension   . Chest pain 09/26/2015  . Anemia 08/15/2015  . Conversion disorder 08/04/2015  . Anxiety and depression 07/17/2015  . Hyperlipidemia 07/17/2015  . DM type 2 (diabetes mellitus, type 2) ([place]) 07/17/2015  . 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 Macular degeneration 07/17/2015  . Insomnia 07/17/2015  . Rheumatoid arthritis ([place]) 12/05/2014  . Shortness of breath 10/13/2011  . Valvular heart disease 10/13/2011  . COPD (chronic obstructive pulmonary disease) ([place]) 09/09/2011  . OSA (obstructive sleep apnea) 09/09/2011  . Nocturnal hypoxemia 09/09/2011  . Gastroesophageal reflux disease 02/24/2011    Past Surgical History:  Procedure Laterality Date  . APPENDECTOMY    . BUNIONECTOMY    . CESAREAN SECTION     x1  . 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 TONSILLECTOMY    . TOTAL HIP ARTHROPLASTY  12/10/11   [facility] left hip  . TUBAL LIGATION      Prior to Admission medications   Medication Sig Start Date End Date Taking? Authorizing Provider  albuterol (VENTOLIN HFA) 108 (90 Base) MCG/ACT inhaler Inhale 2 puffs into the lungs every 6 (six) hours as needed. 11/25/15   Juanito Doom, MD  aspirin 81 MG tablet Take 81 mg by mouth daily.    Historical Provider, MD  budesonide-formoterol (SYMBICORT) 160-4.5 MCG/ACT inhaler Inhale 2 puffs into the lungs 2 (two) times daily. 09/06/12   Juanito Doom, MD  escitalopram (LEXAPRO) 10 MG tablet take 1 tablet by mouth once daily after BREAKFAST 12/20/15   Rainey Pines, MD  folic acid (FOLVITE) 1 MG tablet Take 1 mg by mouth daily.  Historical Provider, MD  furosemide (LASIX) 20 MG tablet take 1 tablet by mouth once daily 09/09/15   Coral Spikes, DO  gabapentin (NEURONTIN) 300 MG capsule Take 1 capsule (300 mg total) by mouth 4 (four) times daily. Patient taking differently: Take 300 mg by mouth 3 (three) times daily.  07/13/15   Shela Leff, MD  HYDROcodone-acetaminophen (NORCO) 5-325 MG tablet Take 1 tablet by mouth 3 (three) times daily as needed. 12/24/15   Makenli Derstine V Bacon Heriberto Stmartin, PA-C  lamoTRIgine (LAMICTAL) 100 MG tablet Take 1 tablet (100 mg total) by mouth 2 (two) times daily. 10/16/15   Rainey Pines, MD  LORazepam (ATIVAN) 0.5 MG tablet take 1 tablet by mouth at bedtime 12/20/15   Rainey Pines, MD  lovastatin (MEVACOR) 20 MG tablet Take 1 tablet (20 mg total) by mouth at bedtime. 10/22/15   Coral Spikes, DO  methotrexate 50 MG/2ML injection Inject 250 mg into the skin once a week. Inject 10 ml once weekly. Patient takes on Monday. 09/27/14   Historical Provider, MD  montelukast (SINGULAIR) 10 MG tablet take 1 tablet by mouth once daily 12/02/15   Coral Spikes, DO  multivitamin-lutein (OCUVITE-LUTEIN) CAPS capsule Take 1 capsule by mouth 2 (two) times daily.    Historical Provider, MD  nebivolol (BYSTOLIC) 10 MG tablet Take 1 tablet (10 mg total) by mouth daily. Patient taking differently: Take 5 mg by mouth daily.  07/23/15   Juanito Doom, MD  oxyCODONE-acetaminophen (ROXICET) 5-325 MG tablet Take 1 tablet by mouth every 8 (eight) hours as needed for severe pain. 10/03/15   Coral Spikes, DO  pantoprazole (PROTONIX) 40 MG tablet Take 40 mg by mouth 2 (two) times daily.     Historical Provider, MD  potassium chloride (K-DUR) 10 MEQ tablet take 1 tablet by mouth once daily 09/09/15   Coral Spikes, DO  QUEtiapine (SEROQUEL) 25 MG tablet Take 1 tablet (25 mg total) by mouth at bedtime. 10/16/15   Rainey Pines, MD  ranitidine (ZANTAC) 150 MG capsule take 1 capsule by mouth twice a day 12/03/15   Coral Spikes, DO  ranitidine (ZANTAC) 150 MG tablet Take 150 mg by mouth 2 (two) times daily.    Historical Provider, MD    Allergies Ceftin [cefuroxime axetil]; Lisinopril; Antihistamines, chlorpheniramine-type; Antivert [meclizine hcl]; Decongestant [pseudoephedrine hcl]; Doxycycline; Morphine and related; Sulfa antibiotics; Xarelto [rivaroxaban]; Adhesive [tape]; Iodine; Levaquin [levofloxacin in d5w]; and Tetanus toxoids  Family History  Problem Relation Age of Onset  . Rheum arthritis Mother   . Asthma Mother   . Parkinson's disease Mother   . Heart disease Mother   . Stroke Mother   .  Hypertension Mother   . Heart attack Father   . Heart disease Father   . Hypertension Father   . Diabetes Son   . Asthma Sister   . Heart disease Sister   . Lung cancer Sister   . Heart disease Sister   . Heart disease Sister   . Breast cancer Sister   . Heart attack Sister   . Heart disease Brother   . Heart disease Maternal Grandmother   . Diabetes Maternal Grandmother   . Colon cancer Maternal Grandmother   . Heart disease Brother   . Alcohol abuse Brother   . Depression Brother     Social History Social History  Substance Use Topics  . Smoking status: Former Smoker    Packs/day: 0.50    Years: 20.00  Types: Cigarettes    Quit date: 02/02/1974  . Smokeless tobacco: Never Used  . Alcohol use No    Review of Systems  Constitutional: Negative for fever. Cardiovascular: Negative for chest pain. Respiratory: Negative for shortness of breath. Musculoskeletal: Negative for back pain. Left foot pain as above. Skin: Negative for rash. Neurological: Negative for headaches, focal weakness or numbness. ____________________________________________  PHYSICAL EXAM:  VITAL SIGNS: ED Triage Vitals  Enc Vitals Group     BP 12/24/15 1600 124/60     Pulse Rate 12/24/15 1600 75     Resp 12/24/15 1600 18     Temp 12/24/15 1600 98.4 F (36.9 C)     Temp Source 12/24/15 1600 Oral     SpO2 12/24/15 1600 97 %     Weight 12/24/15 1600 156 lb (70.8 kg)     Height 12/24/15 1600 5\' 4"  (1.626 m)     Head Circumference --      Peak Flow --      Pain Score 12/24/15 1601 10     Pain Loc --      Pain Edu? --      Excl. in [place]? --    Constitutional: Alert and oriented. Well appearing and in no distress. Head: Normocephalic and atraumatic. Cardiovascular: Normal distal pulses. Respiratory: Normal respiratory effort.  Musculoskeletal: Left foot without any obvious acute deformity. Patient has chronic changes are noted to underlying rheumatoid arthritis. She is tender to palpation to  the dorsal medial aspect of the left foot. There is some mild early ecchymosis noted. Nontender with normal range of motion in all extremities.  Neurologic:  Normal speech and language. No gross focal neurologic deficits are appreciated. Skin:  Skin is warm, dry and intact. No rash noted. ____________________________________________   RADIOLOGY  Left Foot IMPRESSION: No acute abnormality noted.  I, Joaovictor Krone, Dannielle Karvonen, personally viewed and evaluated these images (plain radiographs) as part of my medical decision making, as well as reviewing the written report by the radiologist. ____________________________________________  PROCEDURES  Ace bandage ____________________________________________  INITIAL IMPRESSION / ASSESSMENT AND PLAN / ED COURSE  Patient with a left foot sprain without radiologic evidence of a fracture or dislocation. She is fitted with an Ace bandage and discharged with a prescription for Norco #10. She will follow-up with Dr. Earleen Newport her podiatrist for further evaluation and management.  Clinical Course    ____________________________________________  FINAL CLINICAL IMPRESSION(S) / ED DIAGNOSES  Final diagnoses:  Foot sprain, left, initial encounter      Melvenia Needles, PA-C 12/24/15 [place] Mal[place], MD 12/25/15 2318

## 2015-12-24 NOTE — ED Triage Notes (Signed)
Pt to ed with c/o fall today after tripping over vacuum cord.  Pt states left ankle pain since.  Pt reports she is unable to bear weight.

## 2015-12-27 ENCOUNTER — Other Ambulatory Visit: Payer: Self-pay | Admitting: Psychiatry

## 2015-12-31 ENCOUNTER — Encounter: Payer: Self-pay | Admitting: Psychiatry

## 2015-12-31 ENCOUNTER — Ambulatory Visit (INDEPENDENT_AMBULATORY_CARE_PROVIDER_SITE_OTHER): Payer: 59 | Admitting: Psychiatry

## 2015-12-31 VITALS — BP 146/56 | HR 84 | Temp 97.4°F | Resp 16

## 2015-12-31 DIAGNOSIS — F3160 Bipolar disorder, current episode mixed, unspecified: Secondary | ICD-10-CM | POA: Diagnosis not present

## 2015-12-31 MED ORDER — ESCITALOPRAM OXALATE 10 MG PO TABS: 10.0000 mg | ORAL_TABLET | Freq: Every day | ORAL | 1 refills | Status: DC

## 2015-12-31 MED ORDER — QUETIAPINE FUMARATE 25 MG PO TABS: 25.0000 mg | ORAL_TABLET | Freq: Every day | ORAL | 1 refills | Status: DC

## 2015-12-31 MED ORDER — LAMOTRIGINE 100 MG PO TABS: 100.0000 mg | ORAL_TABLET | Freq: Two times a day (BID) | ORAL | 2 refills | Status: DC

## 2015-12-31 MED ORDER — LORAZEPAM 0.5 MG PO TABS: 0.5000 mg | ORAL_TABLET | Freq: Every day | ORAL | 1 refills | Status: DC

## 2015-12-31 NOTE — Progress Notes (Signed)
Psychiatric MD Progress Note  Patient Identification: Kerri Carter MRN:  [id number] Date of Evaluation:  12/31/2015 Referral Source: PCP Chief Complaint:   Chief Complaint    Follow-up     Visit Diagnosis:    ICD-9-CM ICD-10-CM   1. Mixed bipolar I disorder (HCC) 296.60 F31.60     History of Present Illness:   Patient is a 75 year old white female who presented for follow up. Patient reported that she has started improving on her medications and she is trying to change her behavior. She reported that she Recently ran out of her Seroquel. Patient was wearing a boot as she reported that she fell  And tripped.  She reported that she sleeps well with the combination of lorazepam and Seroquel. She does not want to change her medications. She reported that she also does not want to continue with her therapy appointments as she feels that the medications are helping her. She was recently given the pain medications that she has been taking on a when necessary basis. Patient denied having any side effects of the medication.    She denied having any conversion symptoms at this time.     Associated Signs/Symptoms: Depression Symptoms:  insomnia, fatigue, feelings of worthlessness/guilt, anxiety, disturbed sleep, (Hypo) Manic Symptoms:  Impulsivity, Irritable Mood, Labiality of Mood, Anxiety Symptoms:  Excessive Worry, Psychotic Symptoms:  Hallucinations: Visual PTSD Symptoms: Negative NA  Past Psychiatric History: H/o conversion disorder.  Previous Psychotropic Medications:  Trazodone  ambien lexapro lamictal   Substance Abuse History in the last 12 months:  No.  Consequences of Substance Abuse: Negative NA  Past Medical History:  Past Medical History:  Diagnosis Date  . Anxiety   . Chicken pox   . CKD (chronic kidney disease), stage III    a. s/p R nephrectomy.  . Conversion disorder   . COPD (chronic obstructive pulmonary disease) ([place])   . Depression   . DM  type 2 (diabetes mellitus, type 2) ([place])   . Essential hypertension   . GERD (gastroesophageal reflux disease)   . Hyperlipidemia   . Inflammatory arthritis    a. hands/carpal tunnel.  b. Low titer rheumatoid factor. c. Negative anti-CCP antibodies. d. Plaquenil.  . Non-Obstructive CAD    a. 07/2009 Cath ([facility]): nonobs dzs;  b. 03/2011 Cath [facility]): nonobs dzs.  . Osteoarthritis    a. Knees.  . PUD (peptic ulcer disease)   . Toxic maculopathy   . Valvular heart disease    a. 07/2015 Echo: EF 55-60%, Mild AI, AS, MR, and TR.    Past Surgical History:  Procedure Laterality Date  . APPENDECTOMY    . BUNIONECTOMY    . CESAREAN SECTION     x1  . 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 TONSILLECTOMY    . TOTAL HIP ARTHROPLASTY  12/10/11   [facility] left hip  . TUBAL LIGATION      Family Psychiatric History:  denied  Family History:  Family History  Problem Relation Age of Onset  . Rheum arthritis Mother   . Asthma Mother   . Parkinson's disease Mother   . Heart disease Mother   . Stroke Mother   . Hypertension Mother   . Heart attack Father   . Heart disease Father   . Hypertension Father   . Diabetes Son   . Asthma Sister   . Heart disease Sister   . Lung cancer Sister   .  Heart disease Sister   . Heart disease Sister   . Breast cancer Sister   . Heart attack Sister   . Heart disease Brother   . Heart disease Maternal Grandmother   . Diabetes Maternal Grandmother   . Colon cancer Maternal Grandmother   . Heart disease Brother   . Alcohol abuse Brother   . Depression Brother     Social History:   Social History   Social History  . Marital status: Married    Spouse name: N/A  . Number of children: 2  . Years of education: Some Coll   Occupational History  . Retired    Social History Main Topics  . Smoking status: Former Smoker    Packs/day: 0.50    Years: 20.00    Types: Cigarettes    Quit date: 02/02/1974  .  Smokeless tobacco: Never Used  . Alcohol use No  . Drug use: No  . Sexual activity: Not Currently   Other Topics Concern  . Not on file   Social History Narrative   Lives at home in [place] with her husband and grandson.   Right-handed.   6 cups coffee per day.    Additional Social History:  Married Lives with husband, grand son and his fiance.   Allergies:   Allergies  Allergen Reactions  . Ceftin [Cefuroxime Axetil] Anaphylaxis    REACTION: tongue and throat swell  . Lisinopril Anaphylaxis    REACTION: tongue and throat swelling (onset 10-10-09)  . Antihistamines, Chlorpheniramine-Type     REACTION: makes pt hyper  . Antivert [Meclizine Hcl]     REACTION: bladder will not empty  . Decongestant [Pseudoephedrine Hcl]     REACTION: makes pt hyper  . Doxycycline     REACTION: GI upset  . Morphine And Related Other (See Comments)    Per patient, low blood pressure issues, following morphine, that requires action to raise it back up.  . Sulfa Antibiotics     Face swelling  . Xarelto [Rivaroxaban]     GI bleed  . Adhesive [Tape] Rash  . Iodine Hives and Rash  . Levaquin [Levofloxacin In D5w] Rash  . Tetanus Toxoids     REACTION: rash, fever, hot to touch at injection site    Metabolic Disorder Labs: Lab Results  Component Value Date   HGBA1C 5.5 09/27/2015   No results found for: PROLACTIN No results found for: CHOL, TRIG, HDL, CHOLHDL, VLDL, LDLCALC   Current Medications: Current Outpatient Prescriptions  Medication Sig Dispense Refill  . albuterol (VENTOLIN HFA) 108 (90 Base) MCG/ACT inhaler Inhale 2 puffs into the lungs every 6 (six) hours as needed. 1 Inhaler 5  . aspirin 81 MG tablet Take 81 mg by mouth daily.    . budesonide-formoterol (SYMBICORT) 160-4.5 MCG/ACT inhaler Inhale 2 puffs into the lungs 2 (two) times daily. 1 Inhaler 2  . escitalopram (LEXAPRO) 10 MG tablet take 1 tablet by mouth once daily after BREAKFAST 30 tablet 1  . folic acid  (FOLVITE) 1 MG tablet Take 1 mg by mouth daily.    . furosemide (LASIX) 20 MG tablet take 1 tablet by mouth once daily 30 tablet 4  . gabapentin (NEURONTIN) 300 MG capsule Take 1 capsule (300 mg total) by mouth 4 (four) times daily. (Patient taking differently: Take 300 mg by mouth 3 (three) times daily. ) 120 capsule 2  . HYDROcodone-acetaminophen (NORCO) 5-325 MG tablet Take 1 tablet by mouth 3 (three) times daily as needed. 10 tablet  0  . lamoTRIgine (LAMICTAL) 100 MG tablet Take 1 tablet (100 mg total) by mouth 2 (two) times daily. 60 tablet 2  . LORazepam (ATIVAN) 0.5 MG tablet take 1 tablet by mouth at bedtime 30 tablet 1  . lovastatin (MEVACOR) 20 MG tablet Take 1 tablet (20 mg total) by mouth at bedtime. 90 tablet 3  . methotrexate 50 MG/2ML injection Inject 250 mg into the skin once a week. Inject 10 ml once weekly. Patient takes on Monday.    . montelukast (SINGULAIR) 10 MG tablet take 1 tablet by mouth once daily 90 tablet 1  . multivitamin-lutein (OCUVITE-LUTEIN) CAPS capsule Take 1 capsule by mouth 2 (two) times daily.    . nebivolol (BYSTOLIC) 10 MG tablet Take 1 tablet (10 mg total) by mouth daily. (Patient taking differently: Take 5 mg by mouth daily. ) 30 tablet 3  . pantoprazole (PROTONIX) 40 MG tablet Take 40 mg by mouth 2 (two) times daily.     . potassium chloride (K-DUR) 10 MEQ tablet take 1 tablet by mouth once daily 30 tablet 4  . QUEtiapine (SEROQUEL) 25 MG tablet Take 1 tablet (25 mg total) by mouth at bedtime. 30 tablet 0  . ranitidine (ZANTAC) 150 MG capsule take 1 capsule by mouth twice a day 60 capsule 5  . ranitidine (ZANTAC) 150 MG tablet Take 150 mg by mouth 2 (two) times daily.    Marland Kitchen oxyCODONE-acetaminophen (ROXICET) 5-325 MG tablet Take 1 tablet by mouth every 8 (eight) hours as needed for severe pain. (Patient not taking: Reported on 12/31/2015) 10 tablet 0   No current facility-administered medications for this visit.     Neurologic: Headache: No Seizure:  No Paresthesias:No  Musculoskeletal: Strength & Muscle Tone: within normal limits Gait & Station: normal Patient leans: N/A  Psychiatric Specialty Exam: ROS  There were no vitals taken for this visit.There is no height or weight on file to calculate BMI.  General Appearance: Casual  Eye Contact:  Fair  Speech:  Clear and Coherent  Volume:  Normal  Mood:  Anxious and Depressed  Affect:  Appropriate  Thought Process:  Coherent  Orientation:  Full (Time, Place, and Person)  Thought Content:  WDL  Suicidal Thoughts:  No  Homicidal Thoughts:  No  Memory:  Immediate;   Fair Recent;   Fair Remote;   Fair  Judgement:  Fair  Insight:  Fair  Psychomotor Activity:  Normal  Concentration:  Concentration: Fair and Attention Span: Fair  Recall:  [employer] of Knowledge:Fair  Language: Fair  Akathisia:  No  Handed:  Right  AIMS (if indicated):    Assets:  Communication Skills Desire for Improvement Physical Health  ADL's:  Intact  Cognition: WNL  Sleep:  poor    Treatment Plan Summary: Medication management   Discussed with patient about her medications. I will adjust her medications as follows.  Continue lamotrigine 100 mg by mouth twice a day Continue  Lexapro 10 mg every morning Continue  lorazepam 0.5 mg by mouth daily at bedtime Continue  Seroquel 25 mg by mouth daily at bedtime for mood symptoms and depression. Discussed with her about the side effects in detail and she demonstrated understanding.  Follow-up in 3 months or earlier depending on her symptoms.   More than 50% of the time spent in psychoeducation, counseling and coordination of care.    This note was generated in part or whole with voice recognition software. Voice regonition is usually quite accurate but there  are transcription errors that can and very often do occur. I apologize for any typographical errors that were not detected and corrected.    Rainey Pines, MD 11/28/201711:10 AM

## 2016-01-03 ENCOUNTER — Ambulatory Visit (INDEPENDENT_AMBULATORY_CARE_PROVIDER_SITE_OTHER): Payer: Medicare Other | Admitting: Podiatry

## 2016-01-03 ENCOUNTER — Ambulatory Visit (INDEPENDENT_AMBULATORY_CARE_PROVIDER_SITE_OTHER): Payer: Medicare Other

## 2016-01-03 DIAGNOSIS — R600 Localized edema: Secondary | ICD-10-CM

## 2016-01-03 DIAGNOSIS — S8265XA Nondisplaced fracture of lateral malleolus of left fibula, initial encounter for closed fracture: Secondary | ICD-10-CM | POA: Diagnosis not present

## 2016-01-03 DIAGNOSIS — R52 Pain, unspecified: Secondary | ICD-10-CM | POA: Diagnosis not present

## 2016-01-03 DIAGNOSIS — S9002XA Contusion of left ankle, initial encounter: Secondary | ICD-10-CM

## 2016-01-03 DIAGNOSIS — M79673 Pain in unspecified foot: Secondary | ICD-10-CM | POA: Diagnosis not present

## 2016-01-05 NOTE — Progress Notes (Signed)
Subjective:  Patient presents today for a new complaint of evaluation of the left foot injury. Patient states that she tripped and fell at her home. Patient went to the emergency department on 12/24/2015. At that time x-rays were taken which were negative for fracture. Patient presents today for further treatment and evaluation.    Objective/Physical Exam General: The patient is alert and oriented x3 in no acute distress.  Dermatology: Skin is warm, dry and supple bilateral lower extremities. Negative for open lesions or macerations.  Vascular: Palpable pedal pulses bilaterally. No edema or erythema noted. Capillary refill within normal limits.  Neurological: Epicritic and protective threshold grossly intact bilaterally.   Musculoskeletal Exam: Significant pain on palpation noted to the lateral aspect of the patient's left foot and ankle. Mild ecchymosis with moderate edema noted. Range of motion within normal limits to all pedal and ankle joints bilateral. Muscle strength 5/5 in all groups bilateral.   Radiographic Exam:  There is evidence of a hairline fracture noted to the fibular malleolus left ankle suggestive of nondisplaced, closed, hairline fracture. Joint spaces are preserved.  Assessment: #1 nondisplaced, closed, hairline fracture fibular malleolus left ankle #2 ecchymosis left ankle and foot #3 edema left ankle and foot   Plan of Care:  #1 Patient was evaluated. #2 today a cam boot was dispensed #3 light Ace wrap applied  #4 discussed with patient in detail the conservative modalities regarding traumatic injuries to the lower extremity. Recommend rest ice compression and elevation with immobilization using the cam boot. #5 return to clinic in 6 weeks for follow-up x-rays   Dr. Edrick Kins, [place]

## 2016-01-06 ENCOUNTER — Telehealth: Payer: Self-pay | Admitting: *Deleted

## 2016-01-06 ENCOUNTER — Telehealth: Payer: Self-pay | Admitting: Podiatry

## 2016-01-06 ENCOUNTER — Other Ambulatory Visit: Payer: Self-pay | Admitting: Podiatry

## 2016-01-06 MED ORDER — HYDROCODONE-ACETAMINOPHEN 5-325 MG PO TABS: ORAL_TABLET | ORAL | 0 refills | Status: DC

## 2016-01-06 NOTE — Telephone Encounter (Signed)
Pt states Dr. Amalia Hailey was to write for pain medication on Friday and she did not get one at the appt.

## 2016-01-06 NOTE — Telephone Encounter (Addendum)
Left message informing pt, she could pick up the Vicodin 5/325mg  #30 one tablet every 6 hours prn foot pain ordered by Dr. Amalia Hailey in the [place] office between 4:30pm and 5:00pm. Pt states she needs someone to call her about the pain medication. Pt asked if she could pick up the rx tomorrow and if there was a specific time. I told her that would be fine there was no specific time tomorrow.

## 2016-01-06 NOTE — Telephone Encounter (Signed)
Dr. Milinda Pointer refilled and printed and was given to Tammy at the front for pt to pick up

## 2016-01-06 NOTE — Telephone Encounter (Signed)
Patient called and said that Dr. Amalia Hailey failed to write her pain med rx on Friday when she was seen. Will pick up in [place] office when ready.

## 2016-01-14 ENCOUNTER — Encounter: Payer: Self-pay | Admitting: Emergency Medicine

## 2016-01-14 DIAGNOSIS — J449 Chronic obstructive pulmonary disease, unspecified: Secondary | ICD-10-CM | POA: Diagnosis not present

## 2016-01-14 DIAGNOSIS — L299 Pruritus, unspecified: Secondary | ICD-10-CM | POA: Diagnosis present

## 2016-01-14 DIAGNOSIS — L500 Allergic urticaria: Secondary | ICD-10-CM | POA: Insufficient documentation

## 2016-01-14 DIAGNOSIS — N183 Chronic kidney disease, stage 3 (moderate): Secondary | ICD-10-CM | POA: Diagnosis not present

## 2016-01-14 DIAGNOSIS — Z7982 Long term (current) use of aspirin: Secondary | ICD-10-CM | POA: Insufficient documentation

## 2016-01-14 DIAGNOSIS — E1122 Type 2 diabetes mellitus with diabetic chronic kidney disease: Secondary | ICD-10-CM | POA: Insufficient documentation

## 2016-01-14 DIAGNOSIS — Z79899 Other long term (current) drug therapy: Secondary | ICD-10-CM | POA: Diagnosis not present

## 2016-01-14 DIAGNOSIS — Z87891 Personal history of nicotine dependence: Secondary | ICD-10-CM | POA: Diagnosis not present

## 2016-01-14 DIAGNOSIS — I129 Hypertensive chronic kidney disease with stage 1 through stage 4 chronic kidney disease, or unspecified chronic kidney disease: Secondary | ICD-10-CM | POA: Insufficient documentation

## 2016-01-14 MED ORDER — PREDNISONE 20 MG PO TABS
ORAL_TABLET | ORAL | Status: AC
  Administered 2016-01-14: 40 mg via ORAL
  Filled 2016-01-14: qty 2

## 2016-01-14 MED ORDER — DIPHENHYDRAMINE HCL 25 MG PO CAPS
ORAL_CAPSULE | ORAL | Status: AC
  Filled 2016-01-14: qty 2

## 2016-01-14 MED ORDER — DIPHENHYDRAMINE HCL 50 MG/ML IJ SOLN
INTRAMUSCULAR | Status: AC
  Administered 2016-01-14: 50 mg via INTRAVENOUS
  Filled 2016-01-14: qty 1

## 2016-01-14 MED ORDER — FAMOTIDINE IN NACL 20-0.9 MG/50ML-% IV SOLN
20.0000 mg | Freq: Once | INTRAVENOUS | Status: AC
  Administered 2016-01-14: 20 mg via INTRAVENOUS

## 2016-01-14 MED ORDER — FAMOTIDINE IN NACL 20-0.9 MG/50ML-% IV SOLN
INTRAVENOUS | Status: AC
  Administered 2016-01-14: 20 mg via INTRAVENOUS
  Filled 2016-01-14: qty 50

## 2016-01-14 MED ORDER — DIPHENHYDRAMINE HCL 50 MG/ML IJ SOLN
50.0000 mg | Freq: Once | INTRAMUSCULAR | Status: AC
  Administered 2016-01-14: 50 mg via INTRAVENOUS

## 2016-01-14 MED ORDER — PREDNISONE 20 MG PO TABS
40.0000 mg | ORAL_TABLET | Freq: Once | ORAL | Status: AC
  Administered 2016-01-14: 40 mg via ORAL

## 2016-01-14 MED ORDER — DIPHENHYDRAMINE HCL 25 MG PO CAPS: 50.0000 mg | ORAL_CAPSULE | Freq: Once | ORAL | Status: DC

## 2016-01-14 NOTE — ED Triage Notes (Signed)
Pt presents tot ED with c/o allergic reaction to either polymyxin B sulfate and trimethoprim or steroid Durezol. Pt reports rx began last night, saw PCP who gave her prednisone to take at 3 pm but has not relieved itching or hives. Pt denies shortness of breath or difficulty breathing. Pt speaking in complete sentences, respirations even and unlabored. Rash noted bilateral legs, face and abdomen.

## 2016-01-15 ENCOUNTER — Emergency Department
Admission: EM | Admit: 2016-01-15 | Discharge: 2016-01-15 | Disposition: A | Discharge status: Home / Self Care | Payer: Medicare Other | Attending: Emergency Medicine | Admitting: Emergency Medicine

## 2016-01-15 DIAGNOSIS — L509 Urticaria, unspecified: Secondary | ICD-10-CM

## 2016-01-15 DIAGNOSIS — L299 Pruritus, unspecified: Secondary | ICD-10-CM

## 2016-01-15 DIAGNOSIS — T7840XA Allergy, unspecified, initial encounter: Secondary | ICD-10-CM

## 2016-01-15 MED ORDER — FAMOTIDINE 20 MG PO TABS: 20.0000 mg | ORAL_TABLET | Freq: Two times a day (BID) | ORAL | 0 refills | Status: DC

## 2016-01-15 MED ORDER — PREDNISONE 20 MG PO TABS
60.0000 mg | ORAL_TABLET | Freq: Every day | ORAL | 0 refills | Status: DC
Start: 2016-01-15 — End: 2016-01-30

## 2016-01-15 MED ORDER — PREDNISONE 20 MG PO TABS: 60.0000 mg | ORAL_TABLET | Freq: Every day | ORAL | 0 refills | Status: DC

## 2016-01-15 NOTE — ED Notes (Signed)
MD at bedside. 

## 2016-01-15 NOTE — ED Notes (Signed)
Pt able to tolerate liquid.

## 2016-01-15 NOTE — ED Provider Notes (Signed)
[facility] Emergency Department Provider Note   ____________________________________________   First MD Initiated Contact with Patient 01/15/16 0301     (approximate)  I have reviewed the triage vital signs and the nursing notes.   HISTORY  Chief Complaint Allergic Reaction    HPI Kerri Carter is a 75 y.o. female who comes into the hospital today with an allergic reaction, rash and itching. The patient had cataract surgery on Monday. She was given a steroid eyedrop and an antibiotic eyedrop. She reports that she had started 1 drop 3 days before the surgery and then started another one yesterday. She reports that she started itching yesterday and then broke out in hives tonight. The patient was scratching very badly. She reports that she doesn't think that the new medication is what caused it but she is not sure. The patient has not had any shortness of breath but did feel like her lips were swollen. She reports that she feels better now but her tongue still feels a little bit thick. The patient is here tonight for evaluation. She's had many allergies in the past.   Past Medical History:  Diagnosis Date  . Anxiety   . Chicken pox   . CKD (chronic kidney disease), stage III    a. s/p R nephrectomy.  . Conversion disorder   . COPD (chronic obstructive pulmonary disease) ([place])   . Depression   . DM type 2 (diabetes mellitus, type 2) ([place])   . Essential hypertension   . GERD (gastroesophageal reflux disease)   . Hyperlipidemia   . Inflammatory arthritis    a. hands/carpal tunnel.  b. Low titer rheumatoid factor. c. Negative anti-CCP antibodies. d. Plaquenil.  . Non-Obstructive CAD    a. 07/2009 Cath ([facility]): nonobs dzs;  b. 03/2011 Cath [facility]): nonobs dzs.  . Osteoarthritis    a. Knees.  . PUD (peptic ulcer disease)   . Toxic maculopathy   . Valvular heart disease    a. 07/2015 Echo: EF 55-60%, Mild AI, AS, MR, and TR.    Patient Active Problem List     Diagnosis Date Noted  . Mixed bipolar I disorder ([place]) 10/09/2015  . Otitis externa 10/03/2015  . CKD (chronic kidney disease), stage III   . Essential hypertension   . Chest pain 09/26/2015  . Anemia 08/15/2015  . Conversion disorder 08/04/2015  . Anxiety and depression 07/17/2015  . Hyperlipidemia 07/17/2015  . DM type 2 (diabetes mellitus, type 2) ([place]) 07/17/2015  . 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 Macular degeneration 07/17/2015  . Insomnia 07/17/2015  . Rheumatoid arthritis ([place]) 12/05/2014  . Shortness of breath 10/13/2011  . Valvular heart disease 10/13/2011  . COPD (chronic obstructive pulmonary disease) ([place]) 09/09/2011  . OSA (obstructive sleep apnea) 09/09/2011  . Nocturnal hypoxemia 09/09/2011  . Gastroesophageal reflux disease 02/24/2011    Past Surgical History:  Procedure Laterality Date  . APPENDECTOMY    . BUNIONECTOMY    . CESAREAN SECTION     x1  . 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 TONSILLECTOMY    . TOTAL HIP ARTHROPLASTY  12/10/11   [facility] left hip  . TUBAL LIGATION      Prior to Admission medications   Medication Sig Start Date End Date Taking? Authorizing Provider  albuterol (VENTOLIN HFA) 108 (90 Base) MCG/ACT inhaler Inhale 2 puffs into the lungs every 6 (six) hours as needed.  11/25/15   Juanito Doom, MD  aspirin 81 MG tablet Take 81 mg by mouth daily.    Historical Provider, MD  escitalopram (LEXAPRO) 10 MG tablet Take 1 tablet (10 mg total) by mouth daily. 12/31/15   Rainey Pines, MD  famotidine (PEPCID) 20 MG tablet Take 1 tablet (20 mg total) by mouth 2 (two) times daily. 01/15/16 01/14/17  Loney Hering, MD  folic acid (FOLVITE) 1 MG tablet Take 1 mg by mouth daily.    Historical Provider, MD  furosemide (LASIX) 20 MG tablet take 1 tablet by mouth once daily 09/09/15   Coral Spikes, DO  gabapentin (NEURONTIN) 300 MG capsule Take 1 capsule (300 mg  total) by mouth 4 (four) times daily. Patient taking differently: Take 300 mg by mouth 3 (three) times daily.  07/13/15   Shela Leff, MD  HYDROcodone-acetaminophen (NORCO) 5-325 MG tablet Take 1 tablet by mouth 3 (three) times daily as needed. 12/24/15   Jenise V Bacon Menshew, PA-C  HYDROcodone-acetaminophen (NORCO/VICODIN) 5-325 MG tablet Take one to 2 tablets by mouth every 6-8 hours as needed for pain. 01/06/16   Max T Hyatt, DPM  lamoTRIgine (LAMICTAL) 100 MG tablet Take 1 tablet (100 mg total) by mouth 2 (two) times daily. 12/31/15   Rainey Pines, MD  LORazepam (ATIVAN) 0.5 MG tablet Take 1 tablet (0.5 mg total) by mouth at bedtime. 12/31/15   Rainey Pines, MD  lovastatin (MEVACOR) 20 MG tablet Take 1 tablet (20 mg total) by mouth at bedtime. 10/22/15   Coral Spikes, DO  methotrexate 50 MG/2ML injection Inject 250 mg into the skin once a week. Inject 10 ml once weekly. Patient takes on Monday. 09/27/14   Historical Provider, MD  montelukast (SINGULAIR) 10 MG tablet take 1 tablet by mouth once daily 12/02/15   Coral Spikes, DO  multivitamin-lutein (OCUVITE-LUTEIN) CAPS capsule Take 1 capsule by mouth 2 (two) times daily.    Historical Provider, MD  nebivolol (BYSTOLIC) 10 MG tablet Take 1 tablet (10 mg total) by mouth daily. Patient taking differently: Take 5 mg by mouth daily.  07/23/15   Juanito Doom, MD  oxyCODONE-acetaminophen (ROXICET) 5-325 MG tablet Take 1 tablet by mouth every 8 (eight) hours as needed for severe pain. Patient not taking: Reported on 12/31/2015 10/03/15   Coral Spikes, DO  pantoprazole (PROTONIX) 40 MG tablet Take 40 mg by mouth 2 (two) times daily.     Historical Provider, MD  potassium chloride (K-DUR) 10 MEQ tablet take 1 tablet by mouth once daily 09/09/15   Coral Spikes, DO  predniSONE (DELTASONE) 20 MG tablet Take 3 tablets (60 mg total) by mouth daily. 01/15/16   Loney Hering, MD  QUEtiapine (SEROQUEL) 25 MG tablet Take 1 tablet (25 mg total) by mouth at  bedtime. 12/31/15   Rainey Pines, MD  ranitidine (ZANTAC) 150 MG capsule take 1 capsule by mouth twice a day 12/03/15   Coral Spikes, DO  ranitidine (ZANTAC) 150 MG tablet Take 150 mg by mouth 2 (two) times daily.    Historical Provider, MD    Allergies Ceftin [cefuroxime axetil]; Lisinopril; Antihistamines, chlorpheniramine-type; Antivert [meclizine hcl]; Decongestant [pseudoephedrine hcl]; Doxycycline; Morphine and related; Sulfa antibiotics; Xarelto [rivaroxaban]; Adhesive [tape]; Iodine; Levaquin [levofloxacin in d5w]; and Tetanus toxoids  Family History  Problem Relation Age of Onset  . Rheum arthritis Mother   . Asthma Mother   . Parkinson's disease Mother   . Heart disease Mother   .  Stroke Mother   . Hypertension Mother   . Heart attack Father   . Heart disease Father   . Hypertension Father   . Diabetes Son   . Asthma Sister   . Heart disease Sister   . Lung cancer Sister   . Heart disease Sister   . Heart disease Sister   . Breast cancer Sister   . Heart attack Sister   . Heart disease Brother   . Heart disease Maternal Grandmother   . Diabetes Maternal Grandmother   . Colon cancer Maternal Grandmother   . Heart disease Brother   . Alcohol abuse Brother   . Depression Brother     Social History Social History  Substance Use Topics  . Smoking status: Former Smoker    Packs/day: 0.50    Years: 20.00    Types: Cigarettes    Quit date: 02/02/1974  . Smokeless tobacco: Never Used  . Alcohol use No    Review of Systems Constitutional: No fever/chills Eyes: No visual changes. ENT: No sore throat. Cardiovascular: Denies chest pain. Respiratory: Denies shortness of breath. Gastrointestinal: No abdominal pain.  No nausea, no vomiting.  No diarrhea.  No constipation. Genitourinary: Negative for dysuria. Musculoskeletal: Negative for back pain. Skin: hives and itching Neurological: Negative for headaches, focal weakness or numbness.  10-point ROS otherwise  negative.  ____________________________________________   PHYSICAL EXAM:  VITAL SIGNS: ED Triage Vitals  Enc Vitals Group     BP 01/14/16 2305 (!) 124/46     Pulse Rate 01/14/16 2305 76     Resp 01/14/16 2305 20     Temp 01/14/16 2305 98.3 F (36.8 C)     Temp Source 01/14/16 2305 Oral     SpO2 01/14/16 2305 97 %     Weight 01/14/16 2306 152 lb (68.9 kg)     Height 01/14/16 2306 5\' 4"  (1.626 m)     Head Circumference --      Peak Flow --      Pain Score --      Pain Loc --      Pain Edu? --      Excl. in [place]? --     Constitutional: sleeping but arousable Well appearing and in no acute distress. Eyes: Shield to right eye, Left Conjunctivae are normal. PERRL. EOMI. Head: Atraumatic. Nose: No congestion/rhinnorhea. Mouth/Throat: Mucous membranes are moist.  Oropharynx non-erythematous. Cardiovascular: Normal rate, regular rhythm. Grossly normal heart sounds.  Good peripheral circulation. Respiratory: Normal respiratory effort.  No retractions. Lungs CTAB. Gastrointestinal: Soft and nontender. No distention. Positive bowel sounds Musculoskeletal: No lower extremity tenderness nor edema.   Neurologic:  Normal speech and language.  Skin:  Some hives on the patient's right lower abdomen and some erythema to the patient's left lower extremity  Psychiatric: Mood and affect are normal. Speech and behavior are normal.  ____________________________________________   LABS (all labs ordered are listed, but only abnormal results are displayed)  Labs Reviewed - No data to display ____________________________________________  EKG  none ____________________________________________  RADIOLOGY  none ____________________________________________   PROCEDURES  Procedure(s) performed: None  Procedures  Critical Care performed: No  ____________________________________________   INITIAL IMPRESSION / ASSESSMENT AND PLAN / ED COURSE  Pertinent labs & imaging results that  were available during my care of the patient were reviewed by me and considered in my medical decision making (see chart for details).  This is a 75 year old female who comes into the hospital today with hives itching after starting a  new eyedrop. The patient has significant amount of allergies is possible that she is allergic to one of the eyedrops that she started. The patient was given some prednisone and Benadryl as well as Pepcid. The patient's itching improved and the rash also improved. The patient reports that her tongue still feels a little bit swollen but it appears well and there does not appear to be any swelling. The patient will be discharged home to follow-up with her primary care physician. She should not take anymore of her eyedrops until she discusses it with her ophthalmologist.  Clinical Course      ____________________________________________   FINAL CLINICAL IMPRESSION(S) / ED DIAGNOSES  Final diagnoses:  Allergic reaction, initial encounter  Hives  Itching      NEW MEDICATIONS STARTED DURING THIS VISIT:  Discharge Medication List as of 01/15/2016  3:37 AM    START taking these medications   Details  famotidine (PEPCID) 20 MG tablet Take 1 tablet (20 mg total) by mouth 2 (two) times daily., Starting Wed 01/15/2016, Until Thu 01/14/2017, Print    predniSONE (DELTASONE) 20 MG tablet Take 3 tablets (60 mg total) by mouth daily., Starting Wed 01/15/2016, Print         Note:  This document was prepared using Dragon voice recognition software and may include unintentional dictation errors.    Loney Hering, MD 01/15/16 [phone]

## 2016-01-30 ENCOUNTER — Encounter: Payer: Self-pay | Admitting: Family Medicine

## 2016-01-30 ENCOUNTER — Ambulatory Visit (INDEPENDENT_AMBULATORY_CARE_PROVIDER_SITE_OTHER): Payer: Medicare Other | Admitting: Family Medicine

## 2016-01-30 DIAGNOSIS — J441 Chronic obstructive pulmonary disease with (acute) exacerbation: Secondary | ICD-10-CM

## 2016-01-30 MED ORDER — PREDNISONE 10 MG (48) PO TBPK: ORAL_TABLET | ORAL | 0 refills | Status: DC

## 2016-01-30 MED ORDER — HYDROCOD POLST-CPM POLST ER 10-8 MG/5ML PO SUER: 5.0000 mL | Freq: Two times a day (BID) | ORAL | 0 refills | Status: DC | PRN

## 2016-01-30 NOTE — Progress Notes (Signed)
Subjective:  Patient ID: Kerri Carter, female    DOB: 12-27-1940  Age: 75 y.o. MRN: [id number]  CC: Cough  HPI:  75 year old female with an extensive past medical history including COPD presents with the above complaint.  Patient is been sick for the past 10 days. She's had upper respiratory symptoms as well as cough. She was seen at [facility] walk-in clinic and was diagnosed with a URI on 12/20. She was treated with Augmentin and Tessalon Perles. She's also had recent brief burst of prednisone that was related to an allergic reaction for which she was seen in the ED. She states that she's had no improvement with treatment. She continues to have cough and shortness of breath. Cough is wet but she is unable to produce any sputum. She reports subjective fever and chills. No known exacerbating factors. No other associated symptoms. No other complaints at this time.  Social Hx   Social History   Social History  . Marital status: Married    Spouse name: N/A  . Number of children: 2  . Years of education: Some Coll   Occupational History  . Retired    Social History Main Topics  . Smoking status: Former Smoker    Packs/day: 0.50    Years: 20.00    Types: Cigarettes    Quit date: 02/02/1974  . Smokeless tobacco: Never Used  . Alcohol use No  . Drug use: No  . Sexual activity: Not Currently   Other Topics Concern  . None   Social History Narrative   Lives at home in [place] with her husband and grandson.   Right-handed.   6 cups coffee per day.   Review of Systems  Constitutional: Positive for fever.  Respiratory: Positive for cough and shortness of breath.    Objective:  BP 129/60   Pulse 85   Temp 98.4 F (36.9 C) (Oral)   Resp 16   Wt 162 lb 2 oz (73.5 kg)   SpO2 95%   BMI 27.83 kg/m   BP/Weight 01/30/2016 01/15/2016 [id number]  Systolic BP Q000111Q 0000000 -  Diastolic BP 60 58 -  Wt. (Lbs) 162.13 - 152  BMI 27.83 26.09 -   Physical Exam  Constitutional: She is  oriented to person, place, and time. She appears well-developed. No distress.  HENT:  Mouth/Throat: Oropharynx is clear and moist.  Cardiovascular: Normal rate and regular rhythm.   2/6 Systolic murmur.   Pulmonary/Chest: Effort normal and breath sounds normal. She has no wheezes. She has no rales.  Neurological: She is alert and oriented to person, place, and time.  Vitals reviewed.  Lab Results  Component Value Date   WBC 8.4 11/14/2015   HGB 11.5 (L) 11/14/2015   HCT 34.8 (L) 11/14/2015   PLT 297.0 11/14/2015   GLUCOSE 78 09/27/2015   ALT 15 09/26/2015   AST 24 09/26/2015   NA 142 09/27/2015   K 4.1 09/27/2015   CL 106 09/27/2015   CREATININE 1.25 (H) 09/27/2015   BUN 17 09/27/2015   CO2 24 09/27/2015   INR 0.9 07/03/2013   HGBA1C 5.5 09/27/2015    Assessment & Plan:   Problem List Items Addressed This Visit    Acute exacerbation of chronic obstructive pulmonary disease (COPD) ([place])    Patient experiencing a mild COPD exacerbation. Is currently on antibiotics. Continue Augmentin. Prednisone burst today. Tussionex for cough.      Relevant Medications   predniSONE (STERAPRED UNI-PAK 48 TAB) 10 MG (48) TBPK  tablet   chlorpheniramine-HYDROcodone (TUSSIONEX PENNKINETIC ER) 10-8 MG/5ML SUER      Meds ordered this encounter  Medications  . predniSONE (STERAPRED UNI-PAK 48 TAB) 10 MG (48) TBPK tablet    Sig: Per package instructions.    Dispense:  48 tablet    Refill:  0  . chlorpheniramine-HYDROcodone (TUSSIONEX PENNKINETIC ER) 10-8 MG/5ML SUER    Sig: Take 5 mLs by mouth every 12 (twelve) hours as needed.    Dispense:  115 mL    Refill:  0    Follow-up: PRN  [place]

## 2016-01-30 NOTE — Patient Instructions (Signed)
Prednisone and cough medication.  Call if you fail to improve or worsen  Take care  Dr. Lacinda Axon

## 2016-01-30 NOTE — Assessment & Plan Note (Signed)
Patient experiencing a mild COPD exacerbation. Is currently on antibiotics. Continue Augmentin. Prednisone burst today. Tussionex for cough.

## 2016-01-30 NOTE — Progress Notes (Signed)
Pre visit review using our clinic review tool, if applicable. No additional management support is needed unless otherwise documented below in the visit note. 

## 2016-01-31 ENCOUNTER — Ambulatory Visit: Payer: Medicare Other | Admitting: Podiatry

## 2016-02-18 ENCOUNTER — Ambulatory Visit (INDEPENDENT_AMBULATORY_CARE_PROVIDER_SITE_OTHER): Payer: Medicare Other | Admitting: Podiatry

## 2016-02-18 ENCOUNTER — Ambulatory Visit (INDEPENDENT_AMBULATORY_CARE_PROVIDER_SITE_OTHER): Payer: Medicare Other

## 2016-02-18 DIAGNOSIS — S8265XD Nondisplaced fracture of lateral malleolus of left fibula, subsequent encounter for closed fracture with routine healing: Secondary | ICD-10-CM

## 2016-02-18 DIAGNOSIS — M79609 Pain in unspecified limb: Secondary | ICD-10-CM | POA: Diagnosis not present

## 2016-02-18 DIAGNOSIS — L608 Other nail disorders: Secondary | ICD-10-CM

## 2016-02-18 DIAGNOSIS — L603 Nail dystrophy: Secondary | ICD-10-CM

## 2016-02-18 DIAGNOSIS — B351 Tinea unguium: Secondary | ICD-10-CM | POA: Diagnosis not present

## 2016-02-20 ENCOUNTER — Other Ambulatory Visit: Payer: Self-pay | Admitting: Psychiatry

## 2016-02-21 ENCOUNTER — Other Ambulatory Visit: Payer: Self-pay | Admitting: Internal Medicine

## 2016-02-21 ENCOUNTER — Other Ambulatory Visit: Payer: Self-pay | Admitting: Family Medicine

## 2016-02-21 NOTE — Telephone Encounter (Signed)
Refilled 07/13/2015. Pt last seen 01/30/16. Please advise?

## 2016-02-23 NOTE — Progress Notes (Signed)
   SUBJECTIVE Patient  presents to office today complaining of elongated, thickened nails. Pain while ambulating in shoes. Patient is unable to trim their own nails.  Patient also presents today for follow-up evaluation of a nondisplaced, closed hairline fracture of the fibular malleolus left ankle.  OBJECTIVE General Patient is awake, alert, and oriented x 3 and in no acute distress. Derm elongated, thickened, dystrophic nails noted 1-5 bilateral. Skin is dry and supple bilateral. Negative open lesions or macerations. Remaining integument unremarkable. Nails are tender, long, thickened and dystrophic with subungual debris, consistent with onychomycosis, 1-5 bilateral. No signs of infection noted. Vasc  DP and PT pedal pulses palpable bilaterally. Temperature gradient within normal limits.  Neuro Epicritic and protective threshold sensation diminished bilaterally.  Musculoskeletal Exam No symptomatic pedal deformities noted bilateral. Muscular strength within normal limits.  Radiographic exam: Hairline fracture noted to the fibular malleolus appears to have been 95% healed. Osseous realignment and fusion has occurred. Joint spaces are preserved.  ASSESSMENT 1. Onychodystrophic nails 1-5 bilateral with hyperkeratosis of nails.  2. Onychomycosis of nail due to dermatophyte bilateral 3. Pain in foot bilateral 4. Nondisplaced, closed, hairline fracture of the fibular malleolus left ankle-healed  PLAN OF CARE 1. Patient evaluated today.  2. Instructed to maintain good pedal hygiene and foot care.  3. Mechanical debridement of nails 1-5 bilaterally performed using a nail nipper. Filed with dremel without incident.  4. Continue AFO brace on the right lower extremity  5. Return to clinic when necessary    Edrick Kins, DPM [facility]  Dr. Edrick Kins, [address]                                        [place], [place] [address]                Office [phone]  Fax [phone]

## 2016-02-25 ENCOUNTER — Emergency Department: Payer: Medicare Other

## 2016-02-25 ENCOUNTER — Emergency Department
Admission: EM | Admit: 2016-02-25 | Discharge: 2016-02-25 | Disposition: A | Discharge status: Home / Self Care | Payer: Medicare Other | Attending: Emergency Medicine | Admitting: Emergency Medicine

## 2016-02-25 ENCOUNTER — Encounter: Payer: Self-pay | Admitting: Emergency Medicine

## 2016-02-25 DIAGNOSIS — N183 Chronic kidney disease, stage 3 (moderate): Secondary | ICD-10-CM | POA: Diagnosis not present

## 2016-02-25 DIAGNOSIS — K5900 Constipation, unspecified: Secondary | ICD-10-CM

## 2016-02-25 DIAGNOSIS — E1122 Type 2 diabetes mellitus with diabetic chronic kidney disease: Secondary | ICD-10-CM | POA: Diagnosis not present

## 2016-02-25 DIAGNOSIS — R109 Unspecified abdominal pain: Secondary | ICD-10-CM

## 2016-02-25 DIAGNOSIS — R1032 Left lower quadrant pain: Secondary | ICD-10-CM | POA: Diagnosis present

## 2016-02-25 DIAGNOSIS — Z7982 Long term (current) use of aspirin: Secondary | ICD-10-CM | POA: Insufficient documentation

## 2016-02-25 DIAGNOSIS — Z87891 Personal history of nicotine dependence: Secondary | ICD-10-CM | POA: Insufficient documentation

## 2016-02-25 DIAGNOSIS — J449 Chronic obstructive pulmonary disease, unspecified: Secondary | ICD-10-CM | POA: Diagnosis not present

## 2016-02-25 DIAGNOSIS — I129 Hypertensive chronic kidney disease with stage 1 through stage 4 chronic kidney disease, or unspecified chronic kidney disease: Secondary | ICD-10-CM | POA: Diagnosis not present

## 2016-02-25 LAB — COMPREHENSIVE METABOLIC PANEL
ALT: 39 U/L (ref 14–54)
AST: 56 U/L — ABNORMAL HIGH (ref 15–41)
Albumin: 3.7 g/dL (ref 3.5–5.0)
Alkaline Phosphatase: 166 U/L — ABNORMAL HIGH (ref 38–126)
Anion gap: 8 (ref 5–15)
BUN: 16 mg/dL (ref 6–20)
CO2: 28 mmol/L (ref 22–32)
Calcium: 8.8 mg/dL — ABNORMAL LOW (ref 8.9–10.3)
Chloride: 105 mmol/L (ref 101–111)
Creatinine, Ser: 1.34 mg/dL — ABNORMAL HIGH (ref 0.44–1.00)
GFR calc Af Amer: 44 mL/min — ABNORMAL LOW (ref >60)
GFR calc non Af Amer: 38 mL/min — ABNORMAL LOW (ref >60)
Glucose, Bld: 115 mg/dL — ABNORMAL HIGH (ref 65–99)
Potassium: 4.3 mmol/L (ref 3.5–5.1)
Sodium: 141 mmol/L (ref 135–145)
Total Bilirubin: 0.8 mg/dL (ref 0.3–1.2)
Total Protein: 6.9 g/dL (ref 6.5–8.1)

## 2016-02-25 LAB — URINALYSIS, COMPLETE (UACMP) WITH MICROSCOPIC
Bacteria, UA: NONE SEEN
Bilirubin Urine: NEGATIVE
Glucose, UA: NEGATIVE mg/dL
Hgb urine dipstick: NEGATIVE
Ketones, ur: NEGATIVE mg/dL
Leukocytes, UA: NEGATIVE
Nitrite: NEGATIVE
Protein, ur: NEGATIVE mg/dL
Specific Gravity, Urine: 1.005 (ref 1.005–1.030)
pH: 6 (ref 5.0–8.0)

## 2016-02-25 LAB — CBC
HCT: 35.5 % (ref 35.0–47.0)
Hemoglobin: 11.6 g/dL — ABNORMAL LOW (ref 12.0–16.0)
MCH: 29 pg (ref 26.0–34.0)
MCHC: 32.7 g/dL (ref 32.0–36.0)
MCV: 88.8 fL (ref 80.0–100.0)
Platelets: 253 10*3/uL (ref 150–440)
RBC: 4 MIL/uL (ref 3.80–5.20)
RDW: 17.3 % — ABNORMAL HIGH (ref 11.5–14.5)
WBC: 8.4 10*3/uL (ref 3.6–11.0)

## 2016-02-25 LAB — LIPASE, BLOOD: Lipase: 43 U/L (ref 11–51)

## 2016-02-25 MED ORDER — HYDROMORPHONE HCL 1 MG/ML IJ SOLN
INTRAMUSCULAR | Status: AC
Start: 2016-02-25 — End: 2016-02-25
  Administered 2016-02-25: 0.5 mg via INTRAVENOUS
  Filled 2016-02-25: qty 1

## 2016-02-25 MED ORDER — OXYCODONE-ACETAMINOPHEN 5-325 MG PO TABS
1.0000 | ORAL_TABLET | ORAL | Status: DC | PRN
  Administered 2016-02-25: 1 via ORAL
  Filled 2016-02-25: qty 1

## 2016-02-25 MED ORDER — SODIUM CHLORIDE 0.9 % IV BOLUS (SEPSIS)
250.0000 mL | Freq: Once | INTRAVENOUS | Status: AC
  Administered 2016-02-25: 250 mL via INTRAVENOUS

## 2016-02-25 MED ORDER — ONDANSETRON 4 MG PO TBDP
4.0000 mg | ORAL_TABLET | Freq: Once | ORAL | Status: AC
  Administered 2016-02-25: 4 mg via ORAL
  Filled 2016-02-25: qty 1

## 2016-02-25 MED ORDER — ONDANSETRON HCL 4 MG/2ML IJ SOLN
INTRAMUSCULAR | Status: AC
  Administered 2016-02-25: 4 mg via INTRAVENOUS
  Filled 2016-02-25: qty 2

## 2016-02-25 MED ORDER — DICYCLOMINE HCL 20 MG PO TABS: 20.0000 mg | ORAL_TABLET | Freq: Three times a day (TID) | ORAL | 0 refills | Status: DC | PRN

## 2016-02-25 MED ORDER — POLYETHYLENE GLYCOL 3350 17 G PO PACK: 17.0000 g | PACK | Freq: Every day | ORAL | 0 refills | Status: DC

## 2016-02-25 MED ORDER — ONDANSETRON HCL 4 MG/2ML IJ SOLN
4.0000 mg | Freq: Once | INTRAMUSCULAR | Status: AC
Start: 2016-02-25 — End: 2016-02-25
  Administered 2016-02-25: 4 mg via INTRAVENOUS

## 2016-02-25 MED ORDER — HYDROMORPHONE HCL 1 MG/ML IJ SOLN
0.5000 mg | Freq: Once | INTRAMUSCULAR | Status: AC
  Administered 2016-02-25: 0.5 mg via INTRAVENOUS

## 2016-02-25 NOTE — Discharge Instructions (Signed)
Please return immediately if condition worsens. Please contact her primary physician or the physician you were given for referral. If you have any specialist physicians involved in her treatment and plan please also contact them. Thank you for using  regional emergency Department.  Please return to the emergency department especially for fever, bloody stool, increased abdominal pain in the face of outpatient treatment or any other new concerns. Please drink plenty of fluids and advance her diet as tolerated and contact her primary physician and/or your gastroenterologist for further evaluation

## 2016-02-25 NOTE — ED Triage Notes (Signed)
Patient to the ER for c/o lower left abdominal pain. States pain radiates upwards to left upper quadrant when standing. Denies any fevers, N/V/D, or constipation. Patient has h/o GERD and hiatal hernia, but states feels different.

## 2016-02-25 NOTE — ED Notes (Signed)
NAD noted at time of D/C. Pt denies comments/concerns at this time. Pt taken to the lobby via wheelchair by her husband.

## 2016-02-25 NOTE — ED Provider Notes (Signed)
Time Seen: Approximately 1643  I have reviewed the triage notes  Chief Complaint: Abdominal Pain   History of Present Illness: Kerri Carter is a 76 y.o. female who states a several week history of intermittent left-sided lower abdominal pain that's Progressively worse over the last 4 days. She denies any fever at home. She denies any nausea, vomiting, diarrhea or feelings of constipation. She denies any back or flank pain. She tried today to establish an outpatient appointment but was unsuccessful. She denies any dysuria, hematuria urinary frequency   Past Medical History:  Diagnosis Date  . Anxiety   . Chicken pox   . CKD (chronic kidney disease), stage III    a. s/p R nephrectomy.  . Conversion disorder   . COPD (chronic obstructive pulmonary disease) ([place])   . Depression   . DM type 2 (diabetes mellitus, type 2) ([place])   . Essential hypertension   . GERD (gastroesophageal reflux disease)   . Hyperlipidemia   . Inflammatory arthritis    a. hands/carpal tunnel.  b. Low titer rheumatoid factor. c. Negative anti-CCP antibodies. d. Plaquenil.  . Non-Obstructive CAD    a. 07/2009 Cath ([facility]): nonobs dzs;  b. 03/2011 Cath [facility]): nonobs dzs.  . Osteoarthritis    a. Knees.  . PUD (peptic ulcer disease)   . Toxic maculopathy   . Valvular heart disease    a. 07/2015 Echo: EF 55-60%, Mild AI, AS, MR, and TR.    Patient Active Problem List   Diagnosis Date Noted  . Acute exacerbation of chronic obstructive pulmonary disease (COPD) ([place]) 01/30/2016  . Mixed bipolar I disorder ([place]) 10/09/2015  . CKD (chronic kidney disease), stage III   . Essential hypertension   . Anemia 08/15/2015  . Conversion disorder 08/04/2015  . Anxiety and depression 07/17/2015  . Hyperlipidemia 07/17/2015  . DM type 2 (diabetes mellitus, type 2) ([place]) 07/17/2015  . 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 Macular degeneration 07/17/2015  . Insomnia 07/17/2015  . Rheumatoid  arthritis ([place]) 12/05/2014  . Valvular heart disease 10/13/2011  . COPD (chronic obstructive pulmonary disease) ([place]) 09/09/2011  . OSA (obstructive sleep apnea) 09/09/2011  . Nocturnal hypoxemia 09/09/2011  . Gastroesophageal reflux disease 02/24/2011    Past Surgical History:  Procedure Laterality Date  . APPENDECTOMY    . BUNIONECTOMY    . CESAREAN SECTION     x1  . 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 TONSILLECTOMY    . TOTAL HIP ARTHROPLASTY  12/10/11   [facility] left hip  . TUBAL LIGATION      Past Surgical History:  Procedure Laterality Date  . APPENDECTOMY    . BUNIONECTOMY    . CESAREAN SECTION     x1  . 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 TONSILLECTOMY    . TOTAL HIP ARTHROPLASTY  12/10/11   [facility] left hip  . TUBAL LIGATION      Current Outpatient Rx  . Order #: [id number] Class: Normal  . Order #: [id number] Class: Historical Med  . Order #: [id number] Class: Print  . Order #: [id number] Class: Print  . Order #: [id number] Class: Normal  . Order #: [id number] Class: Print  . Order #: [id number] Class: Historical Med  . Order #: [id number] Class: Normal  . Order #: [id number] Class: Normal  . Order #: [id number] Class: Normal  . Order #: [id number] Class: Print  . Order #:  [id number] Class: Normal  . Order #: [id number] Class: Historical Med  . Order #: [id number] Class: Normal  . Order #: [id number] Class: Historical Med  . Order #: [id number] Class: Normal  . Order #: [id number] Class: Historical Med  . Order #: [id number] Class: Print  . Order #: [id number] Class: Normal  . Order #: [id number] Class: Normal  . Order #: [id number] Class: Normal  . Order #: [id number] Class: Normal  . Order #: [id number] Class: Historical Med    Allergies:  Ceftin [cefuroxime axetil]; Lisinopril; Antihistamines, chlorpheniramine-type; Antivert [meclizine hcl]; Decongestant [pseudoephedrine hcl]; Doxycycline;  Morphine and related; Sulfa antibiotics; Xarelto [rivaroxaban]; Adhesive [tape]; Iodine; Levaquin [levofloxacin in d5w]; and Tetanus toxoids  Family History: Family History  Problem Relation Age of Onset  . Rheum arthritis Mother   . Asthma Mother   . Parkinson's disease Mother   . Heart disease Mother   . Stroke Mother   . Hypertension Mother   . Heart attack Father   . Heart disease Father   . Hypertension Father   . Diabetes Son   . Asthma Sister   . Heart disease Sister   . Lung cancer Sister   . Heart disease Sister   . Heart disease Sister   . Breast cancer Sister   . Heart attack Sister   . Heart disease Brother   . Heart disease Maternal Grandmother   . Diabetes Maternal Grandmother   . Colon cancer Maternal Grandmother   . Heart disease Brother   . Alcohol abuse Brother   . Depression Brother     Social History: Social History  Substance Use Topics  . Smoking status: Former Smoker    Packs/day: 0.50    Years: 20.00    Types: Cigarettes    Quit date: 02/02/1974  . Smokeless tobacco: Never Used  . Alcohol use No     Review of Systems:   10 point review of systems was performed and was otherwise negative:  Constitutional: No fever Eyes: No visual disturbances ENT: No sore throat, ear pain Cardiac: No chest pain Respiratory: No shortness of breath, wheezing, or stridor Abdomen: Patient points primarily to the left lower quadrant. She denies any upper abdominal pain, chest pain, pleuritic, or positional component to her abdominal pain. Endocrine: No weight loss, No night sweats Extremities: No peripheral edema, cyanosis Skin: No rashes, easy bruising Neurologic: No focal weakness, trouble with speech or swollowing Urologic: No dysuria, Hematuria, or urinary frequency   Physical Exam:  ED Triage Vitals  Enc Vitals Group     BP 02/25/16 1557 123/89     Pulse Rate 02/25/16 1557 89     Resp 02/25/16 1557 18     Temp 02/25/16 1557 98.2 F (36.8 C)      Temp Source 02/25/16 1557 Oral     SpO2 02/25/16 1557 98 %     Weight 02/25/16 1558 156 lb (70.8 kg)     Height 02/25/16 1558 5\' 4"  (1.626 m)     Head Circumference --      Peak Flow --      Pain Score 02/25/16 1558 10     Pain Loc --      Pain Edu? --      Excl. in [place]? --     General: Awake , Alert , and Oriented times 3; GCS 15 Head: Normal cephalic , atraumatic Eyes: Pupils equal , round, reactive to light Nose/Throat: No nasal drainage, patent upper airway without erythema or exudate.  Neck: Supple, Full range of motion, No anterior adenopathy or palpable  thyroid masses Lungs: Clear to ascultation without wheezes , rhonchi, or rales Heart: Regular rate, regular rhythm without murmurs , gallops , or rubs Abdomen: Mild tenderness to deep palpation left lower quadrant without any rebound, guarding or rigidity. No palpable masses. No focal tenderness over McBurney's point. Negative Murphy's sign..        Extremities: 2 plus symmetric pulses. No edema, clubbing or cyanosis Neurologic: normal ambulation, Motor symmetric without deficits, sensory intact Skin: warm, dry, no rashes   Labs:   All laboratory work was reviewed including any pertinent negatives or positives listed below:  Labs Reviewed  COMPREHENSIVE METABOLIC PANEL - Abnormal; Notable for the following:       Result Value   Glucose, Bld 115 (*)    Creatinine, Ser 1.34 (*)    Calcium 8.8 (*)    AST 56 (*)    Alkaline Phosphatase 166 (*)    GFR calc non Af Amer 38 (*)    GFR calc Af Amer 44 (*)    All other components within normal limits  CBC - Abnormal; Notable for the following:    Hemoglobin 11.6 (*)    RDW 17.3 (*)    All other components within normal limits  URINALYSIS, COMPLETE (UACMP) WITH MICROSCOPIC - Abnormal; Notable for the following:    Color, Urine STRAW (*)    APPearance CLEAR (*)    Squamous Epithelial / LPF 0-5 (*)    All other components within normal limits  LIPASE, BLOOD    Radiology:   "Ct Renal Stone Study  Result Date: 02/25/2016 CLINICAL DATA:  Left lower abdominal pain EXAM: CT ABDOMEN AND PELVIS WITHOUT CONTRAST TECHNIQUE: Multidetector CT imaging of the abdomen and pelvis was performed following the standard protocol without IV contrast. COMPARISON:  09/26/2015 FINDINGS: Lower chest: Lung bases are clear. Hepatobiliary: Unenhanced liver is unremarkable. Gallbladder is notable for layering small gallstones (series 2/images 08/22/2026). No associated inflammatory changes. No intrahepatic or extrahepatic ductal dilatation. Pancreas: Within normal limits. Spleen: Within normal limits. Adrenals/Urinary Tract: Adrenal glands within normal limits. Status post right nephrectomy. Left kidney is within normal limits. 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 No renal, ureteral, or bladder calculi. No hydronephrosis. Bladder is partially obscured by streak artifact but grossly unremarkable. Stomach/Bowel: Stomach is notable for a tiny hiatal hernia. No evidence of bowel obstruction. Prior appendectomy. Moderate colonic stool burden. Vascular/Lymphatic: No evidence of abdominal aortic aneurysm. Atherosclerotic calcifications of the abdominal aorta and branch vessels. No suspicious abdominopelvic lymphadenopathy. Reproductive: Status post hysterectomy. No adnexal masses. Other: No abdominopelvic ascites. Musculoskeletal: Degenerative changes of the visualized thoracolumbar spine. Left hip arthroplasty, without evidence of complication. IMPRESSION: No evidence of bowel obstruction.  Prior appendectomy. Moderate colonic stool burden. Status post cholecystectomy, right nephrectomy, hysterectomy, and left hip arthroplasty. No CT findings for the patient's left lower quadrant abdominal pain. Electronically Signed   By: Julian Hy M.D.   On: 02/25/2016 17:42  "     I personally reviewed the radiologic studies    ED Course:  Patient's stay here was uneventful and she does not appear to  have any surgical findings on her CAT scan. She has a moderate colonic stool burden and has had extensive abdominal surgery without any signs of a bowel obstruction. Obvious signs of diverticulitis which was the main concern going into her CAT scan evaluation. She has a history of a single kidney and I didn't want to give her a dye load in her evaluation. The patient was advised to  results of her CAT scan and states that she hasn't really felt constipated. She was advised continue with follow-up with her primary physician or return here if she develops a fever or bloody stool.     Assessment:  Acute unspecified left lower quadrant abdominal pain    Final Clinical Impression:  Final diagnoses:  Left sided abdominal pain  Constipation, unspecified constipation type     Plan:  Outpatient " Discharge Medication List as of 02/25/2016  6:13 PM    START taking these medications   Details  dicyclomine (BENTYL) 20 MG tablet Take 1 tablet (20 mg total) by mouth 3 (three) times daily as needed for spasms., Starting Tue 02/25/2016, Print    polyethylene glycol (MIRALAX) packet Take 17 g by mouth daily., Starting Tue 02/25/2016, Print      " Patient was advised to return immediately if condition worsens. Patient was advised to follow up with their primary care physician or other specialized physicians involved in their outpatient care. The patient and/or family member/power of attorney had laboratory results reviewed at the bedside. All questions and concerns were addressed and appropriate discharge instructions were distributed by the nursing staff.            Daymon Larsen, MD 02/25/16 [phone]

## 2016-02-28 ENCOUNTER — Encounter: Payer: Self-pay | Admitting: Family Medicine

## 2016-02-28 ENCOUNTER — Ambulatory Visit (INDEPENDENT_AMBULATORY_CARE_PROVIDER_SITE_OTHER): Payer: Medicare Other | Admitting: Family Medicine

## 2016-02-28 ENCOUNTER — Other Ambulatory Visit: Payer: Self-pay | Admitting: Family Medicine

## 2016-02-28 VITALS — BP 122/62 | HR 77 | Temp 98.3°F | Resp 14 | Ht 64.0 in | Wt 163.4 lb

## 2016-02-28 DIAGNOSIS — R1032 Left lower quadrant pain: Secondary | ICD-10-CM

## 2016-02-28 DIAGNOSIS — Z0000 Encounter for general adult medical examination without abnormal findings: Secondary | ICD-10-CM | POA: Diagnosis not present

## 2016-02-28 NOTE — Patient Instructions (Addendum)
We will call with your GI appt.  Continue your meds.   Follow up in 3 months  Take care  Dr. Lacinda Axon    Thank you for taking time to come for your Medicare Wellness Visit. I appreciate your ongoing commitment to your health goals. Please review the following plan we discussed and let me know if I can assist you in the future.   These are the goals we discussed: Goals    . Increase physical activity          Chair exercises daily as demonstrated.        This is a list of the screening recommended for you and due dates:  Health Maintenance  Topic Date Due  . Complete foot exam   09/21/1950  . DEXA scan (bone density measurement)  09/22/2007  . Pneumonia vaccines (2 of 2 - PPSV23) 10/26/2014  . Urine Protein Check  09/17/2015  . Eye exam for diabetics  03/17/2016  . Hemoglobin A1C  03/29/2016  . Mammogram  12/12/2017  . Colon Cancer Screening  02/14/2021  . Flu Shot  Completed    Fall Prevention in the Home Introduction Falls can cause injuries. They can happen to people of all ages. There are many things you can do to make your home safe and to help prevent falls. What can I do on the outside of my home?  Regularly fix the edges of walkways and driveways and fix any cracks.  Remove anything that might make you trip as you walk through a door, such as a raised step or threshold.  Trim any bushes or trees on the path to your home.  Use bright outdoor lighting.  Clear any walking paths of anything that might make someone trip, such as rocks or tools.  Regularly check to see if handrails are loose or broken. Make sure that both sides of any steps have handrails.  Any raised decks and porches should have guardrails on the edges.  Have any leaves, snow, or ice cleared regularly.  Use sand or salt on walking paths during winter.  Clean up any spills in your garage right away. This includes oil or grease spills. What can I do in the bathroom?  Use night  lights.  Install grab bars by the toilet and in the tub and shower. Do not use towel bars as grab bars.  Use non-skid mats or decals in the tub or shower.  If you need to sit down in the shower, use a plastic, non-slip stool.  Keep the floor dry. Clean up any water that spills on the floor as soon as it happens.  Remove soap buildup in the tub or shower regularly.  Attach bath mats securely with double-sided non-slip rug tape.  Do not have throw rugs and other things on the floor that can make you trip. What can I do in the bedroom?  Use night lights.  Make sure that you have a light by your bed that is easy to reach.  Do not use any sheets or blankets that are too big for your bed. They should not hang down onto the floor.  Have a firm chair that has side arms. You can use this for support while you get dressed.  Do not have throw rugs and other things on the floor that can make you trip. What can I do in the kitchen?  Clean up any spills right away.  Avoid walking on wet floors.  Keep items that you  use a lot in easy-to-reach places.  If you need to reach something above you, use a strong step stool that has a grab bar.  Keep electrical cords out of the way.  Do not use floor polish or wax that makes floors slippery. If you must use wax, use non-skid floor wax.  Do not have throw rugs and other things on the floor that can make you trip. What can I do with my stairs?  Do not leave any items on the stairs.  Make sure that there are handrails on both sides of the stairs and use them. Fix handrails that are broken or loose. Make sure that handrails are as long as the stairways.  Check any carpeting to make sure that it is firmly attached to the stairs. Fix any carpet that is loose or worn.  Avoid having throw rugs at the top or bottom of the stairs. If you do have throw rugs, attach them to the floor with carpet tape.  Make sure that you have a light switch at the  top of the stairs and the bottom of the stairs. If you do not have them, ask someone to add them for you. What else can I do to help prevent falls?  Wear shoes that:  Do not have high heels.  Have rubber bottoms.  Are comfortable and fit you well.  Are closed at the toe. Do not wear sandals.  If you use a stepladder:  Make sure that it is fully opened. Do not climb a closed stepladder.  Make sure that both sides of the stepladder are locked into place.  Ask someone to hold it for you, if possible.  Clearly mark and make sure that you can see:  Any grab bars or handrails.  First and last steps.  Where the edge of each step is.  Use tools that help you move around (mobility aids) if they are needed. These include:  Canes.  Walkers.  Scooters.  Crutches.  Turn on the lights when you go into a dark area. Replace any light bulbs as soon as they burn out.  Set up your furniture so you have a clear path. Avoid moving your furniture around.  If any of your floors are uneven, fix them.  If there are any pets around you, be aware of where they are.  Review your medicines with your doctor. Some medicines can make you feel dizzy. This can increase your chance of falling. Ask your doctor what other things that you can do to help prevent falls. This information is not intended to replace advice given to you by your health care provider. Make sure you discuss any questions you have with your health care provider. Document Released: 11/15/2008 Document Revised: 06/27/2015 Document Reviewed: 02/23/2014  2017 [employer]

## 2016-02-28 NOTE — Progress Notes (Addendum)
Subjective:   Kerri Carter is a 76 y.o. female who presents for an Initial Medicare Annual Wellness Visit.  Review of Systems    No ROS.  Medicare Wellness Visit.  Cardiac Risk Factors include: advanced age (>86men, >74 women);hypertension;sedentary lifestyle     Objective:    Today's Vitals   02/28/16 1407  BP: 122/62  Pulse: 77  Resp: 14  Temp: 98.3 F (36.8 C)  TempSrc: Oral  SpO2: 97%  Weight: 163 lb 6.4 oz (74.1 kg)  Height: 5\' 4"  (1.626 m)   Body mass index is 28.05 kg/m.   Current Medications (verified) Outpatient Encounter Prescriptions as of 02/28/2016  Medication Sig  . albuterol (VENTOLIN HFA) 108 (90 Base) MCG/ACT inhaler Inhale 2 puffs into the lungs every 6 (six) hours as needed.  Marland Kitchen aspirin 81 MG tablet Take 81 mg by mouth daily.  . chlorpheniramine-HYDROcodone (TUSSIONEX PENNKINETIC ER) 10-8 MG/5ML SUER Take 5 mLs by mouth every 12 (twelve) hours as needed.  . dicyclomine (BENTYL) 20 MG tablet Take 1 tablet (20 mg total) by mouth 3 (three) times daily as needed for spasms.  Marland Kitchen escitalopram (LEXAPRO) 10 MG tablet Take 1 tablet (10 mg total) by mouth daily.  . folic acid (FOLVITE) 1 MG tablet Take 1 mg by mouth daily.  . furosemide (LASIX) 20 MG tablet take 1 tablet by mouth once daily  . gabapentin (NEURONTIN) 300 MG capsule take 1 capsule by mouth four times a day  . lamoTRIgine (LAMICTAL) 100 MG tablet Take 1 tablet (100 mg total) by mouth 2 (two) times daily.  Marland Kitchen LORazepam (ATIVAN) 0.5 MG tablet Take 1 tablet (0.5 mg total) by mouth at bedtime.  . lovastatin (MEVACOR) 20 MG tablet Take 1 tablet (20 mg total) by mouth at bedtime.  . methotrexate 50 MG/2ML injection Inject 250 mg into the skin once a week. Inject 10 ml once weekly. Patient takes on Monday.  . montelukast (SINGULAIR) 10 MG tablet take 1 tablet by mouth once daily  . multivitamin-lutein (OCUVITE-LUTEIN) CAPS capsule Take 1 capsule by mouth 2 (two) times daily.  . nebivolol (BYSTOLIC) 10 MG  tablet Take 1 tablet (10 mg total) by mouth daily. (Patient taking differently: Take 5 mg by mouth daily. )  . pantoprazole (PROTONIX) 40 MG tablet Take 40 mg by mouth 2 (two) times daily.   . polyethylene glycol (MIRALAX) packet Take 17 g by mouth daily.  . potassium chloride (K-DUR) 10 MEQ tablet take 1 tablet by mouth once daily  . QUEtiapine (SEROQUEL) 25 MG tablet take 1 tablet by mouth at bedtime  . ranitidine (ZANTAC) 150 MG capsule take 1 capsule by mouth twice a day  . [DISCONTINUED] famotidine (PEPCID) 20 MG tablet Take 1 tablet (20 mg total) by mouth 2 (two) times daily.  . [DISCONTINUED] predniSONE (STERAPRED UNI-PAK 48 TAB) 10 MG (48) TBPK tablet Per package instructions.  . [DISCONTINUED] ranitidine (ZANTAC) 150 MG tablet Take 150 mg by mouth 2 (two) times daily.   No facility-administered encounter medications on file as of 02/28/2016.     Allergies (verified) Ceftin [cefuroxime axetil]; Lisinopril; Antihistamines, chlorpheniramine-type; Antivert [meclizine hcl]; Decongestant [pseudoephedrine hcl]; Doxycycline; Morphine and related; Polymyxin b; Sulfa antibiotics; Xarelto [rivaroxaban]; Adhesive [tape]; Iodine; Levaquin [levofloxacin in d5w]; and Tetanus toxoids   History: Past Medical History:  Diagnosis Date  . Anxiety   . Chicken pox   . CKD (chronic kidney disease), stage III    a. s/p R nephrectomy.  . Conversion disorder   .  COPD (chronic obstructive pulmonary disease) ([place])   . Depression   . DM type 2 (diabetes mellitus, type 2) ([place])   . Essential hypertension   . GERD (gastroesophageal reflux disease)   . Hyperlipidemia   . Inflammatory arthritis    a. hands/carpal tunnel.  b. Low titer rheumatoid factor. c. Negative anti-CCP antibodies. d. Plaquenil.  . Non-Obstructive CAD    a. 07/2009 Cath ([facility]): nonobs dzs;  b. 03/2011 Cath [facility]): nonobs dzs.  . Osteoarthritis    a. Knees.  . PUD (peptic ulcer disease)   . Toxic maculopathy   . Valvular heart disease     a. 07/2015 Echo: EF 55-60%, Mild AI, AS, MR, and TR.   Past Surgical History:  Procedure Laterality Date  . APPENDECTOMY    . BUNIONECTOMY    . CESAREAN SECTION     x1  . 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 TONSILLECTOMY    . TOTAL HIP ARTHROPLASTY  12/10/11   [facility] left hip  . TUBAL LIGATION     Family History  Problem Relation Age of Onset  . Rheum arthritis Mother   . Asthma Mother   . Parkinson's disease Mother   . Heart disease Mother   . Stroke Mother   . Hypertension Mother   . Heart attack Father   . Heart disease Father   . Hypertension Father   . Diabetes Son   . Asthma Sister   . Heart disease Sister   . Lung cancer Sister   . Heart disease Sister   . Heart disease Sister   . Breast cancer Sister   . Heart attack Sister   . Heart disease Brother   . Heart disease Maternal Grandmother   . Diabetes Maternal Grandmother   . Colon cancer Maternal Grandmother   . Heart disease Brother   . Alcohol abuse Brother   . Depression Brother    Social History   Occupational History  . Retired    Social History Main Topics  . Smoking status: Former Smoker    Packs/day: 0.50    Years: 20.00    Types: Cigarettes    Quit date: 02/02/1974  . Smokeless tobacco: Never Used  . Alcohol use No  . Drug use: No  . Sexual activity: Not Currently    Tobacco Counseling Counseling given: Not Answered   Activities of Daily Living In your present state of health, do you have any difficulty performing the following activities: 02/28/2016 09/26/2015  Hearing? N N  Vision? N N  Difficulty concentrating or making decisions? Y N  Walking or climbing stairs? Y Y  Dressing or bathing? N N  Doing errands, shopping? Y N  Preparing Food and eating ? Y -  Using the Toilet? N -  In the past six months, have you accidently leaked urine? Y -  Do you have problems with loss of bowel control? Y -  Managing your Medications? N -    Managing your Finances? N -  Housekeeping or managing your Housekeeping? Y -  Some recent data might be hidden    Immunizations and Health Maintenance Immunization History  Administered Date(s) Administered  . Influenza Split 10/04/2012, 10/25/2013  . Influenza Whole 11/03/2011  . Influenza, High Dose Seasonal PF 10/04/2015  . Influenza,inj,Quad PF,36+ Mos 10/31/2014  . Influenza-Unspecified 01/02/2010, 11/24/2011, 10/21/2013  . Pneumococcal Conjugate-13 10/21/2013  . Pneumococcal Polysaccharide-23 11/07/2015   Health Maintenance Due  Topic  Date Due  . FOOT EXAM  09/21/1950  . DEXA SCAN  09/22/2007  . PNA vac Low Risk Adult (2 of 2 - PPSV23) 10/26/2014  . URINE MICROALBUMIN  09/17/2015    Patient Care Team: Coral Spikes, DO as PCP - General (Family Medicine)  Indicate any recent Medical Services you may have received from other than [facility] providers in the past year (date may be approximate).     Assessment:   This is a routine wellness examination for Lincy.  The goal of the wellness visit is to assist the patient how to close the gaps in care and create a preventative care plan for the patient.   Osteoporosis risk reviewed.  Medications reviewed; taking without issues or barriers.  Safety issues reviewed; smoke detectors in the home. No firearms in the home. Wears seatbelts when driving or riding with others. No violence in the home.  No identified risk were noted; The patient was oriented x 3; appropriate in dress and manner and no objective failures at ADL's or IADL's.   BMI; discussed the importance of a healthy diet, water intake and exercise. Educational material provided.  HTN; followed by PCP.  Patient Concerns: None at this time. Follow up with PCP as needed.  Hearing/Vision screen Hearing Screening Comments: Patient passes the whisper test Vision Screening Comments: Followed by [facility] Last OV 01/2016 Cataracts extracted, bilateral Wears  glasses for reading only Visual acuity not assessed per patient preference since she has regular follow up with her ophthalmologist    Dietary issues and exercise activities discussed: Current Exercise Habits: The patient does not participate in regular exercise at present  Goals    . Increase physical activity          Chair exercises daily as demonstrated.       Depression Screen PHQ 2/9 Scores 02/28/2016 01/30/2016  PHQ - 2 Score 0 0  Exception Documentation (No Data) -  Some encounter information is confidential and restricted. Go to Review Flowsheets activity to see all data.    Fall Risk Fall Risk  02/28/2016 01/30/2016  Falls in the past year? Yes Yes  Number falls in past yr: 1 1  Injury with Fall? Yes Yes  Risk for fall due to : History of fall(s) -  Follow up Falls prevention discussed -    Cognitive Function: MMSE - Mini Mental State Exam 02/28/2016 07/11/2015  Orientation to time 5 4  Orientation to Place 5 5  Registration 3 3  Attention/ Calculation 5 5  Recall 3 1  Language- name 2 objects 2 2  Language- repeat 1 1  Language- follow 3 step command 3 3  Language- read & follow direction 1 1  Write a sentence 1 1  Copy design 1 1  Total score 30 27        Screening Tests Health Maintenance  Topic Date Due  . FOOT EXAM  09/21/1950  . DEXA SCAN  09/22/2007  . PNA vac Low Risk Adult (2 of 2 - PPSV23) 10/26/2014  . URINE MICROALBUMIN  09/17/2015  . OPHTHALMOLOGY EXAM  03/17/2016  . HEMOGLOBIN A1C  03/29/2016  . MAMMOGRAM  12/12/2017  . COLONOSCOPY  02/14/2021  . INFLUENZA VACCINE  Completed      Plan:    End of life planning; Advance aging; Advanced directives discussed. No HCPOA/Living Will.  Additional information declined at this time.  Medicare Attestation I have personally reviewed: The patient's medical and social history Their use of  alcohol, tobacco or illicit drugs Their current medications and supplements The patient's functional  ability including ADLs,fall risks, home safety risks, cognitive, and hearing and visual impairment Diet and physical activities Evidence for depression   The patient's weight, height, BMI, and visual acuity have been recorded in the chart.  I have made referrals and provided education to the patient based on review of the above and I have provided the patient with a written personalized care plan for preventive services.    During the course of the visit, Magin was educated and counseled about the following appropriate screening and preventive services:   Vaccines to include Pneumoccal, Influenza, Hepatitis B, Td, Zostavax, HCV  Electrocardiogram  Cardiovascular disease screening  Colorectal cancer screening  Bone density screening  Diabetes screening  Glaucoma screening  Mammography/PAP  Nutrition counseling  Smoking cessation counseling  Patient Instructions (the written plan) were given to the patient.    Varney Biles, LPN   [id number]    Care was provided under my supervision. I agree with the management as indicated in the note.  Thersa Salt DO

## 2016-02-28 NOTE — Progress Notes (Signed)
Subjective:  Patient ID: Kerri Carter, female    DOB: February 22, 1940  Age: 76 y.o. MRN: [id number]  CC: Follow up; Abdominal pain  HPI:  76 year old female with HTN, HLD, COPD, CKD, Mood disorder/Conversion disorder presents for follow up. She complains of abdominal pain.  Abdominal pain  Has been going on for the past several weeks.  Located in the LLQ.  Severe, intermittent.  Has been seen in the ED recently (1/23).  CT revealed moderate stool burden. No other acute abnormalities.  No fever.  No nausea, vomiting, diarrhea. No hematochezia or melena.  No other associated symptoms.  No known exacerbating or relieving factors.   Social Hx   Social History   Social History  . Marital status: Married    Spouse name: N/A  . Number of children: 2  . Years of education: Some Coll   Occupational History  . Retired    Social History Main Topics  . Smoking status: Former Smoker    Packs/day: 0.50    Years: 20.00    Types: Cigarettes    Quit date: 02/02/1974  . Smokeless tobacco: Never Used  . Alcohol use No  . Drug use: No  . Sexual activity: Not Currently   Other Topics Concern  . None   Social History Narrative   Lives at home in [place] with her husband and grandson.   Right-handed.   6 cups coffee per day.    Review of Systems  Constitutional: Negative.   Gastrointestinal: Positive for abdominal pain.   Objective:  BP 122/62 (BP Location: Left Arm, Patient Position: Sitting, Cuff Size: Normal)   Pulse 77   Temp 98.3 F (36.8 C) (Oral)   Resp 14   Ht 5\' 4"  (1.626 m)   Wt 163 lb 6.4 oz (74.1 kg)   SpO2 97%   BMI 28.05 kg/m   BP/Weight 02/28/2016 02/25/2016 [id number]  Systolic BP 123XX123 0000000 [id number]  Diastolic BP 62 56 60  Wt. (Lbs) 163.4 156 162.13  BMI 28.05 26.78 27.83   Physical Exam  Constitutional: She is oriented to person, place, and time. She appears well-developed. No distress.  Cardiovascular: Normal rate and regular rhythm.     Pulmonary/Chest: Effort normal and breath sounds normal.  Abdominal: Soft.  Tender to palpation in the LLQ.  Neurological: She is alert and oriented to person, place, and time.  Psychiatric: She has a normal mood and affect.  Vitals reviewed.   Lab Results  Component Value Date   WBC 8.4 02/25/2016   HGB 11.6 (L) 02/25/2016   HCT 35.5 02/25/2016   PLT 253 02/25/2016   GLUCOSE 115 (H) 02/25/2016   ALT 39 02/25/2016   AST 56 (H) 02/25/2016   NA 141 02/25/2016   K 4.3 02/25/2016   CL 105 02/25/2016   CREATININE 1.34 (H) 02/25/2016   BUN 16 02/25/2016   CO2 28 02/25/2016   INR 0.9 07/03/2013   HGBA1C 5.5 09/27/2015    Assessment & Plan:   Problem List Items Addressed This Visit    LLQ pain    New problem. Uncertain etiology/prognosis at this time. Patient has seen GI but has to wait until April for colonoscopy. Sending to another GI practice to arrange for quicker colonoscopy given continued symptoms/pain.      Relevant Orders   Ambulatory referral to Gastroenterology    Other Visit Diagnoses    Encounter for Medicare annual wellness exam    -  Primary  Follow-up: 3 months  [place]

## 2016-02-29 DIAGNOSIS — R1032 Left lower quadrant pain: Secondary | ICD-10-CM | POA: Insufficient documentation

## 2016-02-29 NOTE — Assessment & Plan Note (Signed)
New problem. Uncertain etiology/prognosis at this time. Patient has seen GI but has to wait until April for colonoscopy. Sending to another GI practice to arrange for quicker colonoscopy given continued symptoms/pain.

## 2016-03-04 ENCOUNTER — Telehealth: Payer: Self-pay | Admitting: *Deleted

## 2016-03-04 NOTE — Telephone Encounter (Signed)
Diffuse degenerative changes. No acute findings.

## 2016-03-04 NOTE — Telephone Encounter (Signed)
Pt requested her Xray results from [facility]. Pt stated [facility] sent the results to this office.  Pt contact [phone]

## 2016-03-04 NOTE — Telephone Encounter (Signed)
Have you reviewed X-ray in vanilla folder.

## 2016-03-04 NOTE — Telephone Encounter (Signed)
Per DPR a voicemail was left with her results.

## 2016-03-06 ENCOUNTER — Other Ambulatory Visit: Payer: Self-pay | Admitting: Psychiatry

## 2016-03-13 ENCOUNTER — Telehealth: Payer: Self-pay

## 2016-03-13 NOTE — Telephone Encounter (Signed)
Patient wants to know if she can get a sooner appointment in with Dr. Vicente Males. Please call patient and advice. After 1:00, call [phone] (There are no open dates as of right now, the appointment would need to be squeezed in)

## 2016-03-18 ENCOUNTER — Telehealth: Payer: Self-pay | Admitting: Family Medicine

## 2016-03-18 NOTE — Telephone Encounter (Signed)
Pt called and stated that she has not urinated since yesterday. She stated that she only has once kidney. Pt was worried and PCP was consulted and stated pt needs to increase fluid intake and if not urinating by 1 or 2 p.m. Pt needs to go to ED.

## 2016-03-19 ENCOUNTER — Other Ambulatory Visit: Payer: Self-pay | Admitting: Family Medicine

## 2016-03-19 ENCOUNTER — Telehealth: Payer: Self-pay | Admitting: *Deleted

## 2016-03-19 DIAGNOSIS — R339 Retention of urine, unspecified: Secondary | ICD-10-CM

## 2016-03-19 NOTE — Telephone Encounter (Signed)
No referral at this time placed. Please advise?

## 2016-03-19 NOTE — Telephone Encounter (Signed)
Referral placed.

## 2016-03-19 NOTE — Telephone Encounter (Signed)
Pt requested to have a call from [facility], pt was seen at [facility] emergency, she has a cathter placed and has concerns in reference to her referral to the urologist.  Pt contact [phone]

## 2016-03-19 NOTE — Telephone Encounter (Signed)
Pt would like to go local.

## 2016-03-24 ENCOUNTER — Ambulatory Visit (INDEPENDENT_AMBULATORY_CARE_PROVIDER_SITE_OTHER): Payer: Medicare Other | Admitting: Urology

## 2016-03-24 ENCOUNTER — Other Ambulatory Visit: Payer: Self-pay | Admitting: Family Medicine

## 2016-03-24 VITALS — BP 111/65 | HR 74 | Ht 64.0 in | Wt 163.7 lb

## 2016-03-24 DIAGNOSIS — R339 Retention of urine, unspecified: Secondary | ICD-10-CM

## 2016-03-24 DIAGNOSIS — N9489 Other specified conditions associated with female genital organs and menstrual cycle: Secondary | ICD-10-CM

## 2016-03-24 LAB — BLADDER SCAN AMB NON-IMAGING: Scan Result: 0

## 2016-03-24 NOTE — Progress Notes (Signed)
Fill and Pull Catheter Removal  Patient is present today for a catheter removal.  Patient was cleaned and prepped in a sterile fashion 250 ml of sterile water/ saline was instilled into the bladder when the patient felt the urge to urinate. 9 ml of water was then drained from the balloon.  A 16 FR foley cath was removed from the bladder no complications were noted .  Patient as then given some time to void on their own.  Patient can void  250 ml on their own after some time.  Patient tolerated well. Preformed by: [place]  Follow up/ Additional notes: this afternoon with a PVR

## 2016-03-24 NOTE — Progress Notes (Signed)
03/24/2016 9:33 AM   Kerri Carter 1940-08-25 [id number]  Referring provider: Coral Spikes, DO [address] [place], [place] [address]  Chief Complaint  Patient presents with  . New Patient (Initial Visit)    urine retention     HPI: I was consult to assess the patient has urinary retention that came on acutely 6 days ago. Normally she reports a good flow and does feel empty. She has no nocturia. She voids every few hours. She has no neurologic issues. She has not had bladder surgery. I could not identify a precipitating cause though she may been on antivert. She was not constipated  Modifying factors: There are no other modifying factors  Associated signs and symptoms: There are no other associated signs and symptoms Aggravating and relieving factors: There are no other aggravating or relieving factors Severity: Moderate Duration: Persistent     PMH: Past Medical History:  Diagnosis Date  . Anxiety   . Chicken pox   . CKD (chronic kidney disease), stage III    a. s/p R nephrectomy.  . Conversion disorder   . COPD (chronic obstructive pulmonary disease) ([place])   . Depression   . DM type 2 (diabetes mellitus, type 2) ([place])   . Essential hypertension   . GERD (gastroesophageal reflux disease)   . Hyperlipidemia   . Inflammatory arthritis    a. hands/carpal tunnel.  b. Low titer rheumatoid factor. c. Negative anti-CCP antibodies. d. Plaquenil.  . Non-Obstructive CAD    a. 07/2009 Cath ([facility]): nonobs dzs;  b. 03/2011 Cath [facility]): nonobs dzs.  . Osteoarthritis    a. Knees.  . PUD (peptic ulcer disease)   . Toxic maculopathy   . Valvular heart disease    a. 07/2015 Echo: EF 55-60%, Mild AI, AS, MR, and TR.    Surgical History: Past Surgical History:  Procedure Laterality Date  . APPENDECTOMY    . BUNIONECTOMY    . CESAREAN SECTION     x1  . 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 TONSILLECTOMY    . TOTAL  HIP ARTHROPLASTY  12/10/11   [facility] left hip  . TUBAL LIGATION      Home Medications:  Allergies as of 03/24/2016      Reactions   Ceftin [cefuroxime Axetil] Anaphylaxis   Other reaction(s): Other (See Comments) REACTION: tongue and throat swell Other Reaction: TONGUE AND THROAT SWELLING REACTION: tongue and throat swell   Lisinopril Anaphylaxis   REACTION: tongue and throat swelling (onset 10-10-09)   Antihistamines, Chlorpheniramine-type    REACTION: makes pt hyper   Antivert [meclizine Hcl]    Other reaction(s): Unknown REACTION: bladder will not empty REACTION: bladder will not empty   Decongestant [pseudoephedrine Hcl]    Other reaction(s): Unknown REACTION: makes pt hyper REACTION: makes pt hyper   Doxycycline    REACTION: GI upset   Morphine And Related Other (See Comments)   Per patient, low blood pressure issues, following morphine, that requires action to raise it back up.   Polymyxin B    Medication was in eye drops.   Sulfa Antibiotics    Face swelling   Xarelto [rivaroxaban]    Other reaction(s): Other (See Comments) GI bleed Stomach burning, bleeding, and tar in stool GI bleed   Adhesive [tape] Rash   Iodine Hives, Rash   Levaquin [levofloxacin In D5w] Rash   Tetanus Toxoids    REACTION: rash, fever, hot  to touch at injection site      Medication List       Accurate as of 03/24/16  9:33 AM. Always use your most recent med list.          albuterol 108 (90 Base) MCG/ACT inhaler Commonly known as:  VENTOLIN HFA Inhale 2 puffs into the lungs every 6 (six) hours as needed.   aspirin 81 MG tablet Take 81 mg by mouth daily.   chlorpheniramine-HYDROcodone 10-8 MG/5ML Suer Commonly known as:  TUSSIONEX PENNKINETIC ER Take 5 mLs by mouth every 12 (twelve) hours as needed.   dicyclomine 20 MG tablet Commonly known as:  BENTYL Take 1 tablet (20 mg total) by mouth 3 (three) times daily as needed for spasms.   escitalopram 10 MG tablet Commonly known as:   LEXAPRO take 1 tablet by mouth once daily   folic acid 1 MG tablet Commonly known as:  FOLVITE Take 1 mg by mouth daily.   furosemide 20 MG tablet Commonly known as:  LASIX take 1 tablet by mouth once daily   gabapentin 300 MG capsule Commonly known as:  NEURONTIN take 1 capsule by mouth four times a day   lamoTRIgine 100 MG tablet Commonly known as:  LAMICTAL Take 1 tablet (100 mg total) by mouth 2 (two) times daily.   LORazepam 0.5 MG tablet Commonly known as:  ATIVAN Take 1 tablet (0.5 mg total) by mouth at bedtime.   lovastatin 20 MG tablet Commonly known as:  MEVACOR Take 1 tablet (20 mg total) by mouth at bedtime.   methotrexate 50 MG/2ML injection Inject 250 mg into the skin once a week. Inject 10 ml once weekly. Patient takes on Monday.   montelukast 10 MG tablet Commonly known as:  SINGULAIR take 1 tablet by mouth once daily   multivitamin-lutein Caps capsule Take 1 capsule by mouth 2 (two) times daily.   nebivolol 10 MG tablet Commonly known as:  BYSTOLIC Take 1 tablet (10 mg total) by mouth daily.   pantoprazole 40 MG tablet Commonly known as:  PROTONIX Take 40 mg by mouth 2 (two) times daily.   polyethylene glycol packet Commonly known as:  MIRALAX Take 17 g by mouth daily.   potassium chloride 10 MEQ tablet Commonly known as:  K-DUR take 1 tablet by mouth once daily   QUEtiapine 25 MG tablet Commonly known as:  SEROQUEL take 1 tablet by mouth at bedtime   ranitidine 150 MG capsule Commonly known as:  ZANTAC take 1 capsule by mouth twice a day       Allergies:  Allergies  Allergen Reactions  . Ceftin [Cefuroxime Axetil] Anaphylaxis    Other reaction(s): Other (See Comments) REACTION: tongue and throat swell Other Reaction: TONGUE AND THROAT SWELLING REACTION: tongue and throat swell  . Lisinopril Anaphylaxis    REACTION: tongue and throat swelling (onset 10-10-09)  . Antihistamines, Chlorpheniramine-Type     REACTION: makes pt hyper   . Antivert [Meclizine Hcl]     Other reaction(s): Unknown REACTION: bladder will not empty REACTION: bladder will not empty  . Decongestant [Pseudoephedrine Hcl]     Other reaction(s): Unknown REACTION: makes pt hyper REACTION: makes pt hyper  . Doxycycline     REACTION: GI upset  . Morphine And Related Other (See Comments)    Per patient, low blood pressure issues, following morphine, that requires action to raise it back up.  . Polymyxin B     Medication was in eye drops.  . Sulfa  Antibiotics     Face swelling  . Xarelto [Rivaroxaban]     Other reaction(s): Other (See Comments) GI bleed Stomach burning, bleeding, and tar in stool GI bleed  . Adhesive [Tape] Rash  . Iodine Hives and Rash  . Levaquin [Levofloxacin In D5w] Rash  . Tetanus Toxoids     REACTION: rash, fever, hot to touch at injection site    Family History: Family History  Problem Relation Age of Onset  . Rheum arthritis Mother   . Asthma Mother   . Parkinson's disease Mother   . Heart disease Mother   . Stroke Mother   . Hypertension Mother   . Heart attack Father   . Heart disease Father   . Hypertension Father   . Diabetes Son   . Asthma Sister   . Heart disease Sister   . Lung cancer Sister   . Heart disease Sister   . Heart disease Sister   . Breast cancer Sister   . Heart attack Sister   . Heart disease Brother   . Heart disease Maternal Grandmother   . Diabetes Maternal Grandmother   . Colon cancer Maternal Grandmother   . Heart disease Brother   . Alcohol abuse Brother   . Depression Brother     Social History:  reports that she quit smoking about 42 years ago. Her smoking use included Cigarettes. She has a 10.00 pack-year smoking history. She has never used smokeless tobacco. She reports that she does not drink alcohol or use drugs.  ROS:                                        Physical Exam: BP 111/65   Pulse 74   Ht 5\' 4"  (1.626 m)   Wt 74.3 kg (163 lb  11.2 oz)   BMI 28.10 kg/m   Constitutional:  Alert and oriented, No acute distress. HEENT: [place] AT, moist mucus membranes.  Trachea midline, no masses. Cardiovascular: No clubbing, cyanosis, or edema. Respiratory: Normal respiratory effort, no increased work of breathing. GI: Abdomen is soft, nontender, nondistended, no abdominal masses GU: No CVA tenderness. Foley  Skin: No rashes, bruises or suspicious lesions. Lymph: No cervical or inguinal adenopathy. Neurologic: Grossly intact, no focal deficits, moving all 4 extremities. Psychiatric: Normal mood and affect.  Laboratory Data: Lab Results  Component Value Date   WBC 8.4 02/25/2016   HGB 11.6 (L) 02/25/2016   HCT 35.5 02/25/2016   MCV 88.8 02/25/2016   PLT 253 02/25/2016    Lab Results  Component Value Date   CREATININE 1.34 (H) 02/25/2016    No results found for: PSA  No results found for: TESTOSTERONE  Lab Results  Component Value Date   HGBA1C 5.5 09/27/2015    Urinalysis    Component Value Date/Time   COLORURINE STRAW (A) 02/25/2016 [place] (A) 02/25/2016 1604   APPEARANCEUR Clear 11/24/2013 2117   LABSPEC 1.005 02/25/2016 1604   LABSPEC 1.003 11/24/2013 2117   PHURINE 6.0 02/25/2016 [place] 02/25/2016 1604   GLUCOSEU Negative 11/24/2013 2117   HGBUR NEGATIVE 02/25/2016 1604   BILIRUBINUR NEGATIVE 02/25/2016 1604   BILIRUBINUR Negative 11/24/2013 2117   KETONESUR NEGATIVE 02/25/2016 1604   PROTEINUR NEGATIVE 02/25/2016 1604   NITRITE NEGATIVE 02/25/2016 1604   LEUKOCYTESUR NEGATIVE 02/25/2016 1604   LEUKOCYTESUR Negative 11/24/2013 2117    Pertinent  Imaging: none  Assessment & Plan:  The patient had I date urinary retention. Pathophysiology discussed. Fill and pull recommended. We will reassess the patient later this afternoon  The patient came back voiding well and had a residual of 0 mL. Reassurance given and I will see her when necessary  1. Urinary  retention 2. Urinary frequency   No Follow-up on file.  Reece Packer, MD  [facility] [address] [place], [place] [address] [phone]

## 2016-03-26 NOTE — Telephone Encounter (Signed)
Currently no appt's sooner as both physicians are overbooked. Will add pt to wait list.

## 2016-03-31 ENCOUNTER — Ambulatory Visit (INDEPENDENT_AMBULATORY_CARE_PROVIDER_SITE_OTHER): Payer: Medicare Other | Admitting: Gastroenterology

## 2016-03-31 ENCOUNTER — Other Ambulatory Visit
Admission: RE | Admit: 2016-03-31 | Discharge: 2016-03-31 | Disposition: A | Discharge status: Home / Self Care | Payer: Medicare Other | Source: Ambulatory Visit | Attending: Gastroenterology | Admitting: Gastroenterology

## 2016-03-31 ENCOUNTER — Other Ambulatory Visit: Payer: Self-pay | Admitting: *Deleted

## 2016-03-31 ENCOUNTER — Encounter: Payer: Self-pay | Admitting: Gastroenterology

## 2016-03-31 VITALS — BP 120/74 | HR 73 | Ht 64.0 in | Wt 165.0 lb

## 2016-03-31 DIAGNOSIS — K921 Melena: Secondary | ICD-10-CM | POA: Diagnosis not present

## 2016-03-31 DIAGNOSIS — K9289 Other specified diseases of the digestive system: Secondary | ICD-10-CM | POA: Diagnosis not present

## 2016-03-31 DIAGNOSIS — R1030 Lower abdominal pain, unspecified: Secondary | ICD-10-CM

## 2016-03-31 DIAGNOSIS — Z86010 Personal history of colonic polyps: Secondary | ICD-10-CM

## 2016-03-31 LAB — TSH: TSH: 1.44 u[IU]/mL (ref 0.350–4.500)

## 2016-03-31 NOTE — Progress Notes (Signed)
Gastroenterology Consultation  Referring Provider:     Coral Spikes, DO Primary Care Physician:  Coral Spikes, DO Primary Gastroenterologist:  Dr. Jonathon Bellows  Reason for Consultation:     Abdominal pain         HPI:   Kerri Carter is a 76 y.o. y/o female referred for consultation & management  by Dr. Coral Spikes, DO.    She has previously seen Dr Tiffany Kocher at [place] , last office visit 02/2016 . 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  She has visited the ER 02/25/16 for abdominal pain. CT abdomen pelvis was negative but showed an adnexal lesion which was recommended to undergo a pelvic USG.   LFT's in 02/2014 showed an elevated alkaline phosphatase , normal lipase and Hb 11 grams . Iron studies normal .She has a had a heel fracture recently.    She is due for a colonoscopy . Last 5 years back and she had polyps. She is due for a repeat and could not have it done till April with Dr Tiffany Kocher and wanted it sooner.   She has had on and off swallowing issues for solids and liquids, recalls being "stretched in the past "   Abdominal pain: Onset: 2-3 months, comes and goes. Once a week , lasts at times for hours  Site :lower abdomen  Radiation: localized.  Severity :very severe at times  Petra Kuba of pain: pressure  Aggravating factors: noting  Relieving factors :laying down, bowel movements  Weight loss: no rather gained  NSAID use: no  PPI use :zantac, and protonix daily BID , been on it since 2010  Gall bladder surgery: no  Frequency of bowel movements: every day  Change in bowel movements: used to go twice a day, recently changed in shape to a thinner size, stool has been black on and off for the past 2-3 months. Not on oral iron  Relief with bowel movements: yes, abdominal distension "appears pregnant ", no better after passing gas, when she passes gas" the smell  kills her husband". She consumes crystalite 2 glasses a day . No diet sodas.Consumes on and off chewing gum Gas/Bloating/Abdominal distension: yes.    Past Medical History:  Diagnosis Date  . Anxiety   . Chicken pox   . CKD (chronic kidney disease), stage III    a. s/p R nephrectomy.  . Conversion disorder   . COPD (chronic obstructive pulmonary disease) ([place])   . Depression   . DM type 2 (diabetes mellitus, type 2) ([place])   . Essential hypertension   . GERD (gastroesophageal reflux disease)   . Hyperlipidemia   . Inflammatory arthritis    a. hands/carpal tunnel.  b. Low titer rheumatoid factor. c. Negative anti-CCP antibodies. d. Plaquenil.  . Non-Obstructive CAD    a. 07/2009 Cath ([facility]): nonobs dzs;  b. 03/2011 Cath [facility]): nonobs dzs.  . Osteoarthritis    a. Knees.  . PUD (peptic ulcer disease)   . Toxic maculopathy   . Valvular heart disease    a. 07/2015 Echo: EF 55-60%, Mild AI, AS, MR, and TR.    Past Surgical History:  Procedure Laterality Date  . APPENDECTOMY    . BUNIONECTOMY    . CESAREAN SECTION     x1  . NEPHRECTOMY  1988   right nephrectomy recondary to aneurysm of the  right renal artery  . TONSILLECTOMY    . TOTAL HIP ARTHROPLASTY  12/10/11   [facility] left hip  . TUBAL LIGATION      Prior to Admission medications   Medication Sig Start Date End Date Taking? Authorizing Provider  albuterol (VENTOLIN HFA) 108 (90 Base) MCG/ACT inhaler Inhale 2 puffs into the lungs every 6 (six) hours as needed. 11/25/15  Yes Juanito Doom, MD  aspirin 81 MG tablet Take 81 mg by mouth daily.   Yes Historical Provider, MD  BD INSULIN SYRINGE ULTRAFINE 31G X 15/64" 1 ML MISC  02/04/16  Yes Historical Provider, MD  BREO ELLIPTA 100-25 MCG/INH AEPB  02/04/16  Yes Historical Provider, MD  chlorpheniramine-HYDROcodone (TUSSIONEX PENNKINETIC ER) 10-8 MG/5ML SUER Take 5 mLs by mouth every 12 (twelve) hours as needed. 01/30/16  Yes Coral Spikes, DO  dicyclomine (BENTYL) 20 MG tablet Take  1 tablet (20 mg total) by mouth 3 (three) times daily as needed for spasms. 02/25/16  Yes Daymon Larsen, MD  DUREZOL 0.05 % EMUL instill 1 drop into left eye twice a day 01/23/16  Yes Historical Provider, MD  escitalopram (LEXAPRO) 10 MG tablet take 1 tablet by mouth once daily 03/06/16  Yes Rainey Pines, MD  folic acid (FOLVITE) 1 MG tablet Take 1 mg by mouth daily.   Yes Historical Provider, MD  furosemide (LASIX) 20 MG tablet take 1 tablet by mouth once daily 09/09/15  Yes Coral Spikes, DO  gabapentin (NEURONTIN) 300 MG capsule take 1 capsule by mouth four times a day 02/21/16  Yes Coral Spikes, DO  HYDROcodone-acetaminophen (NORCO/VICODIN) 5-325 MG tablet take 1 to 2 tablets by mouth every 6 to 8 hours if needed for pain 01/07/16  Yes Historical Provider, MD  lamoTRIgine (LAMICTAL) 100 MG tablet Take 1 tablet (100 mg total) by mouth 2 (two) times daily. 12/31/15  Yes Rainey Pines, MD  LORazepam (ATIVAN) 0.5 MG tablet Take 1 tablet (0.5 mg total) by mouth at bedtime. 12/31/15  Yes Rainey Pines, MD  lovastatin (MEVACOR) 20 MG tablet Take 1 tablet (20 mg total) by mouth at bedtime. 10/22/15  Yes Coral Spikes, DO  methotrexate 50 MG/2ML injection Inject 250 mg into the skin once a week. Inject 10 ml once weekly. Patient takes on Monday. 09/27/14  Yes Historical Provider, MD  montelukast (SINGULAIR) 10 MG tablet take 1 tablet by mouth once daily 12/02/15  Yes Jayce G Cook, DO  multivitamin-lutein (OCUVITE-LUTEIN) CAPS capsule Take 1 capsule by mouth 2 (two) times daily.   Yes Historical Provider, MD  nebivolol (BYSTOLIC) 10 MG tablet Take 1 tablet (10 mg total) by mouth daily. Patient taking differently: Take 5 mg by mouth daily.  07/23/15  Yes Juanito Doom, MD  pantoprazole (PROTONIX) 40 MG tablet Take 40 mg by mouth 2 (two) times daily.    Yes Historical Provider, MD  polyethylene glycol (MIRALAX) packet Take 17 g by mouth daily. 02/25/16  Yes Daymon Larsen, MD  polyethylene glycol powder  [facility]) powder take as directed for COLONIC PREP 02/27/16  Yes Historical Provider, MD  potassium chloride (K-DUR) 10 MEQ tablet take 1 tablet by mouth once daily 09/09/15  Yes Coral Spikes, DO  QUEtiapine (SEROQUEL) 25 MG tablet take 1 tablet by mouth at bedtime 02/24/16  Yes Rainey Pines, MD  RA NASAL ALLERGY 55 MCG/ACT AERO nasal inhaler instill 2 sprays into each nostril once daily 01/22/16  Yes Historical Provider, MD  ranitidine (ZANTAC) 150  MG capsule take 1 capsule by mouth twice a day 12/03/15  Yes Coral Spikes, DO    Family History  Problem Relation Age of Onset  . Rheum arthritis Mother   . Asthma Mother   . Parkinson's disease Mother   . Heart disease Mother   . Stroke Mother   . Hypertension Mother   . Heart attack Father   . Heart disease Father   . Hypertension Father   . Diabetes Son   . Asthma Sister   . Heart disease Sister   . Lung cancer Sister   . Heart disease Sister   . Heart disease Sister   . Breast cancer Sister   . Heart attack Sister   . Heart disease Brother   . Heart disease Maternal Grandmother   . Diabetes Maternal Grandmother   . Colon cancer Maternal Grandmother   . Heart disease Brother   . Alcohol abuse Brother   . Depression Brother      Social History  Substance Use Topics  . Smoking status: Former Smoker    Packs/day: 0.50    Years: 20.00    Types: Cigarettes    Quit date: 02/02/1974  . Smokeless tobacco: Never Used  . Alcohol use No    Allergies as of 03/31/2016 - Review Complete 03/31/2016  Allergen Reaction Noted  . Ceftin [cefuroxime axetil] Anaphylaxis 09/08/2011  . Lisinopril Anaphylaxis 09/08/2011  . Antihistamines, chlorpheniramine-type  09/08/2011  . Antivert [meclizine hcl]  09/08/2011  . Decongestant [pseudoephedrine hcl]  09/08/2011  . Doxycycline  09/08/2011  . Morphine and related Other (See Comments) 06/21/2015  . Polymyxin b  02/28/2016  . Sulfa antibiotics  04/25/2014  . Xarelto [rivaroxaban]   01/19/2012  . Adhesive [tape] Rash 09/08/2011  . Iodine Hives and Rash 09/08/2011  . Levaquin [levofloxacin in d5w] Rash 09/08/2011  . Tetanus toxoids  09/08/2011    Review of Systems:    All systems reviewed and negative except where noted in HPI.   Physical Exam:  BP 120/74   Pulse 73   Ht 5\' 4"  (1.626 m)   Wt 165 lb (74.8 kg)   BMI 28.32 kg/m  No LMP recorded. Patient is postmenopausal. Psych:  Alert and cooperative. Normal mood and affect. General:   Alert,  Well-developed, well-nourished, pleasant and cooperative in NAD Head:  Normocephalic and atraumatic. Eyes:  Sclera clear, no icterus.   Conjunctiva pink. Ears:  Normal auditory acuity. Nose:  No deformity, discharge, or lesions. Mouth:  No deformity or lesions,oropharynx pink & moist. Neck:  Supple; no masses or thyromegaly. Lungs:  Respirations even and unlabored.  Clear throughout to auscultation.   No wheezes, crackles, or rhonchi. No acute distress. Heart:  Regular rate and rhythm; no murmurs, clicks, rubs, or gallops. Abdomen:  Normal bowel sounds.  No bruits.  Soft, non-tender and non-distended without masses, hepatosplenomegaly or hernias noted.  No guarding or rebound tenderness.    Msk:  Symmetrical without gross deformities. Good, equal movement & strength bilaterally. Neurologic:  Alert and oriented x3;  grossly normal neurologically. Lymph Nodes:  No significant cervical adenopathy. Psych:  Alert and cooperative. Normal mood and affect.  Imaging Studies: No results found.  Assessment and Plan:   Kerri Carter is a 76 y.o. y/o female has been referred for lower abdominal pain. She also has a lot of gas and bloating which may be due to carbohydrate malabsorption or from artificial sugars in crystallite and chewing gum . She also has some history  of melena and prior history of small bowel , duodenal AVM,s . She is also due for her surveillance colonosocpy .    1. Gas/bloating/abdominal pain Stop all  artificial sugars including crystallite and chewing gum , if still having a lot of gas at next visit. Will plan to treat with antibiotics for small bowel bacterial overgrowth. Trial of Low FODMAP diet . I will check celiac serology and TSH. Check stool for H pylori antigen   2.Melena- EGD due to history of duodenal AVM's. Also has some history of dysphagia and will dilate if I see a stricture  3.Lower abdominal pain likely due to gas- if no better after stopping artificial sugars will then treat with Xifaxan.   4. Personal history of colon polyps- Colonoscopy    I have discussed alternative options, risks & benefits,  which include, but are not limited to, bleeding, infection, perforation,respiratory complication & drug reaction.  The patient agrees with this plan & written consent will be obtained.     Follow up in 4 weeks   Dr [name]

## 2016-04-01 ENCOUNTER — Other Ambulatory Visit: Payer: Self-pay

## 2016-04-01 ENCOUNTER — Ambulatory Visit: Payer: 59 | Admitting: Psychiatry

## 2016-04-01 ENCOUNTER — Ambulatory Visit
Admission: RE | Admit: 2016-04-01 | Discharge: 2016-04-01 | Disposition: A | Discharge status: Home / Self Care | Payer: Medicare Other | Source: Ambulatory Visit | Attending: Family Medicine | Admitting: Family Medicine

## 2016-04-01 ENCOUNTER — Encounter: Payer: Self-pay | Admitting: *Deleted

## 2016-04-01 DIAGNOSIS — N9489 Other specified conditions associated with female genital organs and menstrual cycle: Secondary | ICD-10-CM

## 2016-04-01 DIAGNOSIS — N949 Unspecified condition associated with female genital organs and menstrual cycle: Secondary | ICD-10-CM | POA: Diagnosis present

## 2016-04-01 DIAGNOSIS — N858 Other specified noninflammatory disorders of uterus: Secondary | ICD-10-CM | POA: Insufficient documentation

## 2016-04-01 DIAGNOSIS — Z86010 Personal history of colonic polyps: Secondary | ICD-10-CM

## 2016-04-01 LAB — GLIADIN ANTIBODIES, SERUM
Gliadin IgA: 3 units (ref 0–19)
Gliadin IgG: 2 units (ref 0–19)

## 2016-04-02 ENCOUNTER — Ambulatory Visit: Payer: Medicare Other | Admitting: Anesthesiology

## 2016-04-02 ENCOUNTER — Encounter: Payer: Self-pay | Admitting: Anesthesiology

## 2016-04-02 ENCOUNTER — Encounter
Admission: RE | Disposition: A | Discharge status: Home / Self Care | Payer: Self-pay | Source: Ambulatory Visit | Attending: Gastroenterology

## 2016-04-02 ENCOUNTER — Ambulatory Visit
Admission: RE | Admit: 2016-04-02 | Discharge: 2016-04-02 | Disposition: A | Discharge status: Home / Self Care | Payer: Medicare Other | Source: Ambulatory Visit | Attending: Gastroenterology | Admitting: Gastroenterology

## 2016-04-02 ENCOUNTER — Telehealth: Payer: Self-pay

## 2016-04-02 ENCOUNTER — Telehealth: Payer: Self-pay | Admitting: *Deleted

## 2016-04-02 DIAGNOSIS — K2980 Duodenitis without bleeding: Secondary | ICD-10-CM | POA: Insufficient documentation

## 2016-04-02 DIAGNOSIS — K921 Melena: Secondary | ICD-10-CM | POA: Insufficient documentation

## 2016-04-02 DIAGNOSIS — J449 Chronic obstructive pulmonary disease, unspecified: Secondary | ICD-10-CM | POA: Diagnosis not present

## 2016-04-02 DIAGNOSIS — K317 Polyp of stomach and duodenum: Secondary | ICD-10-CM | POA: Diagnosis not present

## 2016-04-02 DIAGNOSIS — K529 Noninfective gastroenteritis and colitis, unspecified: Secondary | ICD-10-CM | POA: Insufficient documentation

## 2016-04-02 DIAGNOSIS — K5710 Diverticulosis of small intestine without perforation or abscess without bleeding: Secondary | ICD-10-CM | POA: Diagnosis not present

## 2016-04-02 DIAGNOSIS — D125 Benign neoplasm of sigmoid colon: Secondary | ICD-10-CM

## 2016-04-02 DIAGNOSIS — R109 Unspecified abdominal pain: Secondary | ICD-10-CM

## 2016-04-02 DIAGNOSIS — K219 Gastro-esophageal reflux disease without esophagitis: Secondary | ICD-10-CM | POA: Insufficient documentation

## 2016-04-02 DIAGNOSIS — I2510 Atherosclerotic heart disease of native coronary artery without angina pectoris: Secondary | ICD-10-CM | POA: Diagnosis not present

## 2016-04-02 DIAGNOSIS — Z8711 Personal history of peptic ulcer disease: Secondary | ICD-10-CM | POA: Diagnosis not present

## 2016-04-02 DIAGNOSIS — N183 Chronic kidney disease, stage 3 (moderate): Secondary | ICD-10-CM | POA: Diagnosis not present

## 2016-04-02 DIAGNOSIS — E1122 Type 2 diabetes mellitus with diabetic chronic kidney disease: Secondary | ICD-10-CM | POA: Insufficient documentation

## 2016-04-02 DIAGNOSIS — I129 Hypertensive chronic kidney disease with stage 1 through stage 4 chronic kidney disease, or unspecified chronic kidney disease: Secondary | ICD-10-CM | POA: Diagnosis not present

## 2016-04-02 DIAGNOSIS — M064 Inflammatory polyarthropathy: Secondary | ICD-10-CM | POA: Insufficient documentation

## 2016-04-02 DIAGNOSIS — K640 First degree hemorrhoids: Secondary | ICD-10-CM

## 2016-04-02 DIAGNOSIS — Z7984 Long term (current) use of oral hypoglycemic drugs: Secondary | ICD-10-CM | POA: Insufficient documentation

## 2016-04-02 DIAGNOSIS — D128 Benign neoplasm of rectum: Secondary | ICD-10-CM | POA: Diagnosis not present

## 2016-04-02 DIAGNOSIS — R1310 Dysphagia, unspecified: Secondary | ICD-10-CM | POA: Diagnosis not present

## 2016-04-02 DIAGNOSIS — D124 Benign neoplasm of descending colon: Secondary | ICD-10-CM | POA: Diagnosis not present

## 2016-04-02 DIAGNOSIS — G4730 Sleep apnea, unspecified: Secondary | ICD-10-CM | POA: Insufficient documentation

## 2016-04-02 DIAGNOSIS — K2960 Other gastritis without bleeding: Secondary | ICD-10-CM | POA: Insufficient documentation

## 2016-04-02 DIAGNOSIS — Z87891 Personal history of nicotine dependence: Secondary | ICD-10-CM | POA: Insufficient documentation

## 2016-04-02 DIAGNOSIS — Z86010 Personal history of colonic polyps: Secondary | ICD-10-CM

## 2016-04-02 HISTORY — PX: COLONOSCOPY WITH PROPOFOL: SHX5780

## 2016-04-02 HISTORY — PX: ESOPHAGOGASTRODUODENOSCOPY (EGD) WITH PROPOFOL: SHX5813

## 2016-04-02 HISTORY — PX: ESOPHAGEAL DILATION: SHX303

## 2016-04-02 LAB — RETICULIN ANTIBODIES, IGA W TITER: Reticulin Ab, IgA: NEGATIVE titer (ref <2.5)

## 2016-04-02 LAB — TISSUE TRANSGLUTAMINASE, IGA: Tissue Transglutaminase Ab, IgA: <2 U/mL (ref 0–3)

## 2016-04-02 SURGERY — COLONOSCOPY WITH PROPOFOL
Anesthesia: General

## 2016-04-02 MED ORDER — SODIUM CHLORIDE 0.9 % IV SOLN
INTRAVENOUS | Status: DC
  Administered 2016-04-02: 09:00:00 via INTRAVENOUS

## 2016-04-02 MED ORDER — PROPOFOL 500 MG/50ML IV EMUL
INTRAVENOUS | Status: DC | PRN
  Administered 2016-04-02: 150 ug/kg/min via INTRAVENOUS

## 2016-04-02 MED ORDER — FENTANYL CITRATE (PF) 100 MCG/2ML IJ SOLN
INTRAMUSCULAR | Status: DC | PRN
  Administered 2016-04-02: 50 ug via INTRAVENOUS

## 2016-04-02 MED ORDER — GLYCOPYRROLATE 0.2 MG/ML IJ SOLN
INTRAMUSCULAR | Status: DC | PRN
Start: 2016-04-02 — End: 2016-04-02
  Administered 2016-04-02: .2 mg via INTRAVENOUS

## 2016-04-02 MED ORDER — PHENYLEPHRINE HCL 10 MG/ML IJ SOLN
INTRAMUSCULAR | Status: AC
  Filled 2016-04-02: qty 1

## 2016-04-02 MED ORDER — FENTANYL CITRATE (PF) 100 MCG/2ML IJ SOLN
INTRAMUSCULAR | Status: AC
  Filled 2016-04-02: qty 2

## 2016-04-02 MED ORDER — SODIUM CHLORIDE 0.9 % IJ SOLN
INTRAMUSCULAR | Status: AC
  Filled 2016-04-02: qty 10

## 2016-04-02 MED ORDER — PROPOFOL 10 MG/ML IV BOLUS
INTRAVENOUS | Status: AC
  Filled 2016-04-02: qty 20

## 2016-04-02 MED ORDER — ONDANSETRON HCL 4 MG/2ML IJ SOLN: 4.0000 mg | Freq: Once | INTRAMUSCULAR | Status: DC | PRN

## 2016-04-02 MED ORDER — PHENYLEPHRINE HCL 10 MG/ML IJ SOLN
INTRAMUSCULAR | Status: DC | PRN
  Administered 2016-04-02: 50 ug via INTRAVENOUS
  Administered 2016-04-02 (×2): 100 ug via INTRAVENOUS

## 2016-04-02 MED ORDER — FENTANYL CITRATE (PF) 100 MCG/2ML IJ SOLN: 25.0000 ug | INTRAMUSCULAR | Status: DC | PRN

## 2016-04-02 MED ORDER — EPHEDRINE SULFATE 50 MG/ML IJ SOLN
INTRAMUSCULAR | Status: DC | PRN
  Administered 2016-04-02 (×2): 5 mg via INTRAVENOUS

## 2016-04-02 MED ORDER — EPHEDRINE SULFATE 50 MG/ML IJ SOLN
INTRAMUSCULAR | Status: AC
  Filled 2016-04-02: qty 1

## 2016-04-02 MED ORDER — PROPOFOL 500 MG/50ML IV EMUL
INTRAVENOUS | Status: AC
  Filled 2016-04-02: qty 50

## 2016-04-02 MED ORDER — LIDOCAINE HCL (PF) 2 % IJ SOLN
INTRAMUSCULAR | Status: DC | PRN
  Administered 2016-04-02: 40 mg via INTRADERMAL

## 2016-04-02 MED ORDER — PROPOFOL 10 MG/ML IV BOLUS
INTRAVENOUS | Status: DC | PRN
  Administered 2016-04-02: 20 mg via INTRAVENOUS
  Administered 2016-04-02: 10 mg via INTRAVENOUS
  Administered 2016-04-02: 20 mg via INTRAVENOUS
  Administered 2016-04-02: 70 mg via INTRAVENOUS

## 2016-04-02 NOTE — Anesthesia Preprocedure Evaluation (Signed)
Anesthesia Evaluation  Patient identified by MRN, date of birth, ID band Patient awake    Reviewed: Allergy & Precautions, NPO status , Patient's Chart, lab work & pertinent test results, reviewed documented beta blocker date and time   Airway Mallampati: III  TM Distance: <3 FB     Dental  (+) Upper Dentures, Lower Dentures   Pulmonary sleep apnea , COPD,  COPD inhaler, former smoker,    Pulmonary exam normal        Cardiovascular hypertension, + CAD  Normal cardiovascular exam     Neuro/Psych    GI/Hepatic PUD, GERD  Medicated,  Endo/Other  diabetes, Well Controlled, Type 2, Oral Hypoglycemic Agents  Renal/GU Renal InsufficiencyRenal disease     Musculoskeletal  (+) Arthritis , Osteoarthritis,    Abdominal Normal abdominal exam  (+)   Peds negative pediatric ROS (+)  Hematology  (+) anemia ,   Anesthesia Other Findings   Reproductive/Obstetrics                             Anesthesia Physical Anesthesia Plan  ASA: III  Anesthesia Plan: General   Post-op Pain Management:    Induction: Intravenous  Airway Management Planned: Nasal Cannula  Additional Equipment:   Intra-op Plan:   Post-operative Plan:   Informed Consent: I have reviewed the patients History and Physical, chart, labs and discussed the procedure including the risks, benefits and alternatives for the proposed anesthesia with the patient or authorized representative who has indicated his/her understanding and acceptance.   Dental advisory given  Plan Discussed with: CRNA and Surgeon  Anesthesia Plan Comments:         Anesthesia Quick Evaluation

## 2016-04-02 NOTE — Anesthesia Post-op Follow-up Note (Cosign Needed)
Anesthesia QCDR form completed.        

## 2016-04-02 NOTE — Op Note (Signed)
[facility] Gastroenterology Patient Name: Darryl Schlitt Procedure Date: 04/02/2016 9:06 AM MRN: [id number] Account #: [id number] Date of Birth: 16-Aug-1940 Admit Type: Outpatient Age: 76 Room: [facility] ENDO ROOM 4 Gender: Female Note Status: Finalized Procedure:            Upper GI endoscopy Indications:          Abdominal pain, Dysphagia, Melena Providers:            [name], MD Referring MD:         Barnie Del. Lacinda Axon (Referring MD) Medicines:            Monitored Anesthesia Care Complications:        No immediate complications. Procedure:            Pre-Anesthesia Assessment:                       - Prior to the procedure, a History and Physical was                        performed, and patient medications, allergies and                        sensitivities were reviewed. The patient's tolerance of                        previous anesthesia was reviewed.                       - The risks and benefits of the procedure and the                        sedation options and risks were discussed with the                        patient. All questions were answered and informed                        consent was obtained.                       - ASA Grade Assessment: III - A patient with severe                        systemic disease.                       After obtaining informed consent, the endoscope was                        passed under direct vision. Throughout the procedure,                        the patient's blood pressure, pulse, and oxygen                        saturations were monitored continuously. The                        Colonoscope was introduced through the mouth, and  advanced to the fourth part of duodenum. The upper GI                        endoscopy was accomplished with ease. The patient                        tolerated the procedure well. Findings:      The examined duodenum was normal. Biopsies for histology were taken  with       a cold forceps for evaluation of celiac disease.      The examined esophagus was normal. Biopsies were taken with a cold       forceps for histology.      Multiple 5 mm sessile polyps with no bleeding and no stigmata of recent       bleeding were found in the gastric body. The polyp was removed with a       cold biopsy forceps. Resection and retrieval were complete.      Normal mucosa was found in the gastric antrum and in the stomach.       Biopsies were taken with a cold forceps for histology.      A 8 mm non-bleeding diverticulum was found in the second portion of the       duodenum. Impression:           - Normal examined duodenum. Biopsied.                       - Normal esophagus. Biopsied.                       - Multiple gastric polyps. Resected and retrieved.                       - Normal mucosa was found in the antrum and in the                        stomach. Biopsied. Recommendation:       - Discharge patient to home (with escort).                       - Resume previous diet.                       - Continue present medications.                       - Await pathology results.                       - Perform a colonoscopy today. Procedure Code(s):    --- Professional ---                       [phone], Esophagogastroduodenoscopy, flexible, transoral;                        with biopsy, single or multiple Diagnosis Code(s):    --- Professional ---                       K31.7, Polyp of stomach and duodenum  R10.9, Unspecified abdominal pain                       R13.10, Dysphagia, unspecified                       K92.1, Melena (includes Hematochezia) CPT copyright 2016 American Medical Association. All rights reserved. The codes documented in this report are preliminary and upon coder review may  be revised to meet current compliance requirements. Jonathon Bellows, MD [name], MD 04/02/2016 9:24:11 AM This report has been signed  electronically. Number of Addenda: 0 Note Initiated On: 04/02/2016 9:06 AM      [facility]

## 2016-04-02 NOTE — H&P (Signed)
[name] [address], [place], [place] [address] Phone: [phone] Fax : [phone]  Primary Care Physician:  Coral Spikes, DO Primary Gastroenterologist:  Dr. Jonathon Bellows   Pre-Procedure History & Physical: HPI:  Kerri Carter is a 76 y.o. female is here for an endoscopy and colonoscopy.   Past Medical History:  Diagnosis Date  . Anxiety   . Chicken pox   . CKD (chronic kidney disease), stage III    a. s/p R nephrectomy.  . Conversion disorder   . COPD (chronic obstructive pulmonary disease) ([place])   . Depression   . Diabetes mellitus without complication ([place])    Patient denies Diabetes, does not take any diabetic medications  . Essential hypertension   . GERD (gastroesophageal reflux disease)   . Hyperlipidemia   . Inflammatory arthritis    a. hands/carpal tunnel.  b. Low titer rheumatoid factor. c. Negative anti-CCP antibodies. d. Plaquenil.  . Non-Obstructive CAD    a. 07/2009 Cath ([facility]): nonobs dzs;  b. 03/2011 Cath [facility]): nonobs dzs.  . Osteoarthritis    a. Knees.  . PUD (peptic ulcer disease)   . Toxic maculopathy   . Valvular heart disease    a. 07/2015 Echo: EF 55-60%, Mild AI, AS, MR, and TR.    Past Surgical History:  Procedure Laterality Date  . APPENDECTOMY    . BUNIONECTOMY    . CESAREAN SECTION     x1  . 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 TONSILLECTOMY    . TOTAL HIP ARTHROPLASTY  12/10/11   [facility] left hip  . TUBAL LIGATION      Prior to Admission medications   Medication Sig Start Date End Date Taking? Authorizing Provider  aspirin 81 MG tablet Take 81 mg by mouth daily.   Yes Historical Provider, MD  escitalopram (LEXAPRO) 10 MG tablet take 1 tablet by mouth once daily 03/06/16  Yes Rainey Pines, MD  folic acid (FOLVITE) 1 MG tablet Take 1 mg by mouth daily.   Yes Historical Provider, MD  furosemide (LASIX) 20 MG tablet take 1 tablet by mouth once daily 09/09/15  Yes Coral Spikes, DO  gabapentin (NEURONTIN) 300 MG capsule take 1 capsule by mouth four times a day 02/21/16  Yes Jayce G Cook, DO  LORazepam (ATIVAN) 0.5 MG tablet Take 1 tablet (0.5 mg total) by mouth at bedtime. 12/31/15  Yes Rainey Pines, MD  montelukast (SINGULAIR) 10 MG tablet take 1 tablet by mouth once daily 12/02/15  Yes Coral Spikes, DO  multivitamin-lutein (OCUVITE-LUTEIN) CAPS capsule Take 1 capsule by mouth 2 (two) times daily.   Yes Historical Provider, MD  nebivolol (BYSTOLIC) 10 MG tablet Take 1 tablet (10 mg total) by mouth daily. Patient taking differently: Take 5 mg by mouth daily.  07/23/15  Yes Juanito Doom, MD  pantoprazole (PROTONIX) 40 MG tablet Take 40 mg by mouth 2 (two) times daily.    Yes Historical Provider, MD  polyethylene glycol (MIRALAX) packet Take 17 g by mouth daily. 02/25/16  Yes Daymon Larsen, MD  potassium chloride (K-DUR) 10 MEQ tablet take 1 tablet by mouth once daily 09/09/15  Yes Coral Spikes, DO  QUEtiapine (SEROQUEL) 25 MG tablet take 1 tablet by mouth at bedtime 02/24/16  Yes Rainey Pines, MD  ranitidine (ZANTAC) 150 MG capsule take 1 capsule by mouth twice a day 12/03/15  Yes Coral Spikes, DO  albuterol (VENTOLIN HFA) 108 (90 Base) MCG/ACT inhaler Inhale 2 puffs into the lungs every 6 (six) hours as needed. 11/25/15   Juanito Doom, MD  BD INSULIN SYRINGE ULTRAFINE 31G X 15/64" 1 ML MISC  02/04/16   Historical Provider, MD  [place] Patter 100-25 MCG/INH AEPB  02/04/16   Historical Provider, MD  chlorpheniramine-HYDROcodone (TUSSIONEX PENNKINETIC ER) 10-8 MG/5ML SUER Take 5 mLs by mouth every 12 (twelve) hours as needed. 01/30/16   Coral Spikes, DO  dicyclomine (BENTYL) 20 MG tablet Take 1 tablet (20 mg total) by mouth 3 (three) times daily as needed for spasms. 02/25/16   Daymon Larsen, MD  DUREZOL 0.05 % EMUL instill 1 drop into left eye twice a day 01/23/16   Historical Provider, MD  HYDROcodone-acetaminophen (NORCO/VICODIN) 5-325 MG tablet take 1 to 2 tablets by  mouth every 6 to 8 hours if needed for pain 01/07/16   Historical Provider, MD  lamoTRIgine (LAMICTAL) 100 MG tablet Take 1 tablet (100 mg total) by mouth 2 (two) times daily. 12/31/15   Rainey Pines, MD  lovastatin (MEVACOR) 20 MG tablet Take 1 tablet (20 mg total) by mouth at bedtime. 10/22/15   Coral Spikes, DO  methotrexate 50 MG/2ML injection Inject 250 mg into the skin once a week. Inject 10 ml once weekly. Patient takes on Monday. 09/27/14   Historical Provider, MD  polyethylene glycol powder (GLYCOLAX/MIRALAX) powder take as directed for COLONIC PREP 02/27/16   Historical Provider, MD  RA NASAL ALLERGY 55 MCG/ACT AERO nasal inhaler instill 2 sprays into each nostril once daily 01/22/16   Historical Provider, MD    Allergies as of 04/01/2016 - Review Complete 03/31/2016  Allergen Reaction Noted  . Ceftin [cefuroxime axetil] Anaphylaxis 09/08/2011  . Lisinopril Anaphylaxis 09/08/2011  . Antihistamines, chlorpheniramine-type  09/08/2011  . Antivert [meclizine hcl]  09/08/2011  . Decongestant [pseudoephedrine hcl]  09/08/2011  . Doxycycline  09/08/2011  . Morphine and related Other (See Comments) 06/21/2015  . Polymyxin b  02/28/2016  . Sulfa antibiotics  04/25/2014  . Xarelto [rivaroxaban]  01/19/2012  . Adhesive [tape] Rash 09/08/2011  . Iodine Hives and Rash 09/08/2011  . Levaquin [levofloxacin in d5w] Rash 09/08/2011  . Tetanus toxoids  09/08/2011    Family History  Problem Relation Age of Onset  . Rheum arthritis Mother   . Asthma Mother   . Parkinson's disease Mother   . Heart disease Mother   . Stroke Mother   . Hypertension Mother   . Heart attack Father   . Heart disease Father   . Hypertension Father   . Diabetes Son   . Asthma Sister   . Heart disease Sister   . Lung cancer Sister   . Heart disease Sister   . Heart disease Sister   . Breast cancer Sister   . Heart attack Sister   . Heart disease Brother   . Heart disease Maternal Grandmother   . Diabetes  Maternal Grandmother   . Colon cancer Maternal Grandmother   . Heart disease Brother   . Alcohol abuse Brother   . Depression Brother     Social History   Social History  . Marital status: Married    Spouse name: N/A  . Number of children: 2  . Years of education: Some Coll   Occupational History  . Retired    Social History Main Topics  . Smoking status: Former Smoker    Packs/day: 0.50    Years: 20.00  Types: Cigarettes    Quit date: 02/02/1974  . Smokeless tobacco: Never Used  . Alcohol use No  . Drug use: No  . Sexual activity: Not Currently   Other Topics Concern  . Not on file   Social History Narrative   Lives at home in [place] with her husband and grandson.   Right-handed.   6 cups coffee per day.    Review of Systems: See HPI, otherwise negative ROS  Physical Exam: BP (!) 133/53   Pulse 70   Temp 97.5 F (36.4 C) (Tympanic)   Resp 16   Ht 5\' 4"  (1.626 m)   Wt 165 lb (74.8 kg)   SpO2 98%   BMI 28.32 kg/m  General:   Alert,  pleasant and cooperative in NAD Head:  Normocephalic and atraumatic. Neck:  Supple; no masses or thyromegaly. Lungs:  Clear throughout to auscultation.    Heart:  Regular rate and rhythm. Abdomen:  Soft, nontender and nondistended. Normal bowel sounds, without guarding, and without rebound.   Neurologic:  Alert and  oriented x4;  grossly normal neurologically.  Impression/Plan: PHUONG SELVAGGIO is here for an endoscopy and colonoscopy to be performed for melena, dysphagia.   Risks, benefits, limitations, and alternatives regarding  endoscopy, colonoscopy and possible dilation  have been reviewed with the patient.  Questions have been answered.  All parties agreeable.   Jonathon Bellows, MD  04/02/2016, 8:53 AM

## 2016-04-02 NOTE — Telephone Encounter (Signed)
Pt requested lab results  Pt contact [phone]

## 2016-04-02 NOTE — Op Note (Signed)
[facility] Gastroenterology Patient Name: Kerri Carter Procedure Date: 04/02/2016 9:04 AM MRN: [id number] Account #: [id number] Date of Birth: 02-12-1940 Admit Type: Outpatient Age: 76 Room: [facility] ENDO ROOM 4 Gender: Female Note Status: Finalized Procedure:            Colonoscopy Indications:          Melena Providers:            [name], MD Referring MD:         Barnie Del. Lacinda Axon (Referring MD) Medicines:            Monitored Anesthesia Care Complications:        No immediate complications. Procedure:            Pre-Anesthesia Assessment:                       - ASA Grade Assessment: III - A patient with severe                        systemic disease.                       After obtaining informed consent, the colonoscope was                        passed under direct vision. Throughout the procedure,                        the patient's blood pressure, pulse, and oxygen                        saturations were monitored continuously. The Olympus                        CF-HQ190L Colonoscope (S#. [id number]) was introduced                        through the anus and advanced to the the cecum,                        identified by the appendiceal orifice, IC valve and                        transillumination. The colonoscopy was technically                        difficult and complex due to a tortuous colon.                        Successful completion of the procedure was aided by                        applying abdominal pressure. The patient tolerated the                        procedure well. The quality of the bowel preparation                        was good. Findings:      The perianal and digital rectal examinations were normal.      A 6 mm, non-bleeding polyp  was found in the rectum. The polyp was       sessile. The polyp was removed with a cold snare. Resection and       retrieval were complete.      A 10 mm, non-bleeding polyp was found in the sigmoid  colon. The polyp       was semi-pedunculated. The polyp was removed with a hot snare. Resection       and retrieval were complete.      A 7 mm polyp was found in the descending colon. The polyp was sessile.       The polyp was removed with a cold snare. Resection and retrieval were       complete.      A 6 mm polyp was found in the descending colon. The polyp was sessile.       The polyp was removed with a cold biopsy forceps. Resection and       retrieval were complete.      The entire examined colon appeared normal.      The colon (entire examined portion) appeared normal. Biopsies for       histology were taken with a cold forceps from the entire colon for       evaluation of microscopic colitis.      Non-bleeding internal hemorrhoids were found during endoscopy. The       hemorrhoids were medium-sized and Grade I (internal hemorrhoids that do       not prolapse). Impression:           - One 6 mm, non-bleeding polyp in the rectum, removed                        with a cold snare. Resected and retrieved.                       - One 10 mm, non-bleeding polyp in the sigmoid colon,                        removed with a hot snare. Resected and retrieved.                       - One 7 mm polyp in the descending colon, removed with                        a cold snare. Resected and retrieved.                       - One 6 mm polyp in the descending colon, removed with                        a cold biopsy forceps. Resected and retrieved.                       - The entire examined colon is normal.                       - The entire examined colon is normal. Biopsied.                       - Non-bleeding internal hemorrhoids. Recommendation:       - Repeat colonoscopy for surveillance based on  pathology results.                       - Discharge patient to home (with escort).                       - Resume previous diet.                       - Continue present  medications.                       - To visualize the small bowel, perform video capsule                        endoscopy in 2 weeks. Procedure Code(s):    --- Professional ---                       [phone], Colonoscopy, flexible; with removal of tumor(s),                        polyp(s), or other lesion(s) by snare technique                       [id number], 46, Colonoscopy, flexible; with biopsy, single                        or multiple Diagnosis Code(s):    --- Professional ---                       K62.1, Rectal polyp                       D12.5, Benign neoplasm of sigmoid colon                       D12.4, Benign neoplasm of descending colon                       K64.0, First degree hemorrhoids                       K92.1, Melena (includes Hematochezia) CPT copyright 2016 American Medical Association. All rights reserved. The codes documented in this report are preliminary and upon coder review may  be revised to meet current compliance requirements. Jonathon Bellows, MD [name], MD 04/02/2016 10:16:05 AM This report has been signed electronically. Number of Addenda: 0 Note Initiated On: 04/02/2016 9:04 AM Scope Withdrawal Time: 0 hours 20 minutes 15 seconds  Total Procedure Duration: 0 hours 42 minutes 58 seconds       [facility]

## 2016-04-02 NOTE — Anesthesia Postprocedure Evaluation (Signed)
Anesthesia Post Note  Patient: Kerri Carter  Procedure(s) Performed: Procedure(s) (LRB): COLONOSCOPY WITH PROPOFOL (N/A) ESOPHAGOGASTRODUODENOSCOPY (EGD) WITH PROPOFOL (N/A) ESOPHAGEAL DILATION  Patient location during evaluation: PACU Anesthesia Type: General Level of consciousness: awake and alert and oriented Pain management: pain level controlled Vital Signs Assessment: post-procedure vital signs reviewed and stable Respiratory status: spontaneous breathing Cardiovascular status: blood pressure returned to baseline Anesthetic complications: no     Last Vitals:  Vitals:   04/02/16 1100 04/02/16 1110  BP: (!) 116/52 (!) 116/52  Pulse: 69 69  Resp: 10 15  Temp:      Last Pain:  Vitals:   04/02/16 1021  TempSrc: Tympanic                 Miron Marxen

## 2016-04-02 NOTE — Transfer of Care (Signed)
Immediate Anesthesia Transfer of Care Note  Patient: Kerri Carter  Procedure(s) Performed: Procedure(s): COLONOSCOPY WITH PROPOFOL (N/A) ESOPHAGOGASTRODUODENOSCOPY (EGD) WITH PROPOFOL (N/A) ESOPHAGEAL DILATION  Patient Location: PACU  Anesthesia Type:General  Level of Consciousness: awake and alert   Airway & Oxygen Therapy: spontaneous respirations connected to nasal cannula   Post-op Assessment: Report given to RN and Post -op Vital signs reviewed and stable  Post vital signs: Reviewed and stable  Last Vitals:  Vitals:   04/02/16 1020 04/02/16 1021  BP: (!) 102/47 (!) 102/47  Pulse:  74  Resp: 16 11  Temp: 36.4 C 36.4 C    Last Pain:  Vitals:   04/02/16 1021  TempSrc: Tympanic         Complications: No apparent anesthesia complications

## 2016-04-02 NOTE — Telephone Encounter (Signed)
LVTCB

## 2016-04-02 NOTE — Telephone Encounter (Signed)
-----   Message from Coral Spikes, DO sent at 04/01/2016  7:46 PM EST ----- No ovarian mass. Fluid in endometrium. Recommended to see GYN.

## 2016-04-02 NOTE — Anesthesia Procedure Notes (Signed)
Date/Time: 04/02/2016 9:05 AM Performed by: Hedda Slade Pre-anesthesia Checklist: Patient identified, Emergency Drugs available, Suction available and Patient being monitored Patient Re-evaluated:Patient Re-evaluated prior to inductionOxygen Delivery Method: Nasal cannula

## 2016-04-02 NOTE — Telephone Encounter (Signed)
Pt was given labs and gave verbal understanding.

## 2016-04-03 ENCOUNTER — Encounter: Payer: Self-pay | Admitting: Gastroenterology

## 2016-04-03 ENCOUNTER — Telehealth: Payer: Self-pay

## 2016-04-03 LAB — SURGICAL PATHOLOGY

## 2016-04-03 NOTE — Telephone Encounter (Signed)
-----   Message from Jonathon Bellows, MD sent at 04/03/2016 11:13 AM EST ----- Celiac serology -negative

## 2016-04-03 NOTE — Telephone Encounter (Signed)
Advised pt of results per Dr. Anna. 

## 2016-04-05 ENCOUNTER — Other Ambulatory Visit: Payer: Self-pay | Admitting: Family Medicine

## 2016-04-05 DIAGNOSIS — N859 Noninflammatory disorder of uterus, unspecified: Secondary | ICD-10-CM

## 2016-04-05 DIAGNOSIS — N949 Unspecified condition associated with female genital organs and menstrual cycle: Secondary | ICD-10-CM

## 2016-04-06 ENCOUNTER — Telehealth: Payer: Self-pay | Admitting: Family Medicine

## 2016-04-06 NOTE — Telephone Encounter (Signed)
What is her concern?

## 2016-04-06 NOTE — Telephone Encounter (Signed)
Pt was inquiring about her referral to OBGYN. Pt was told information was faxed over to [facility] clinic and we were awaiting scheduling. While speaking with pt she had reviewed her CT from [facility] that was done of her kidney pt wanted you to review it to see what you recommended. She had notice something in the information that alarmed her.

## 2016-04-07 ENCOUNTER — Other Ambulatory Visit: Payer: Self-pay | Admitting: Family Medicine

## 2016-04-07 DIAGNOSIS — N289 Disorder of kidney and ureter, unspecified: Secondary | ICD-10-CM

## 2016-04-07 NOTE — Telephone Encounter (Signed)
Pt stated it was a kidney doctor. She did see a urologist here [place].

## 2016-04-07 NOTE — Telephone Encounter (Signed)
I have printed results for you to review. placed on your desk.

## 2016-04-07 NOTE — Telephone Encounter (Signed)
Needs to see Urology again. Who did she see?

## 2016-04-07 NOTE — Telephone Encounter (Signed)
Referral placed.

## 2016-04-13 ENCOUNTER — Other Ambulatory Visit: Payer: Self-pay | Admitting: Family Medicine

## 2016-04-14 ENCOUNTER — Emergency Department
Admission: EM | Admit: 2016-04-14 | Discharge: 2016-04-14 | Disposition: A | Discharge status: Home / Self Care | Payer: Medicare Other | Attending: Emergency Medicine | Admitting: Emergency Medicine

## 2016-04-14 ENCOUNTER — Encounter: Payer: Self-pay | Admitting: Intensive Care

## 2016-04-14 ENCOUNTER — Ambulatory Visit (INDEPENDENT_AMBULATORY_CARE_PROVIDER_SITE_OTHER): Payer: Medicare Other | Admitting: Obstetrics and Gynecology

## 2016-04-14 ENCOUNTER — Encounter: Payer: Self-pay | Admitting: Obstetrics and Gynecology

## 2016-04-14 VITALS — BP 123/57 | HR 77 | Ht 64.0 in | Wt 165.3 lb

## 2016-04-14 DIAGNOSIS — I129 Hypertensive chronic kidney disease with stage 1 through stage 4 chronic kidney disease, or unspecified chronic kidney disease: Secondary | ICD-10-CM | POA: Insufficient documentation

## 2016-04-14 DIAGNOSIS — R55 Syncope and collapse: Secondary | ICD-10-CM

## 2016-04-14 DIAGNOSIS — N183 Chronic kidney disease, stage 3 (moderate): Secondary | ICD-10-CM | POA: Diagnosis not present

## 2016-04-14 DIAGNOSIS — J449 Chronic obstructive pulmonary disease, unspecified: Secondary | ICD-10-CM | POA: Diagnosis not present

## 2016-04-14 DIAGNOSIS — N950 Postmenopausal bleeding: Secondary | ICD-10-CM

## 2016-04-14 DIAGNOSIS — Z79899 Other long term (current) drug therapy: Secondary | ICD-10-CM | POA: Diagnosis not present

## 2016-04-14 DIAGNOSIS — Z7982 Long term (current) use of aspirin: Secondary | ICD-10-CM | POA: Insufficient documentation

## 2016-04-14 DIAGNOSIS — Z87891 Personal history of nicotine dependence: Secondary | ICD-10-CM | POA: Diagnosis not present

## 2016-04-14 DIAGNOSIS — E1122 Type 2 diabetes mellitus with diabetic chronic kidney disease: Secondary | ICD-10-CM | POA: Diagnosis not present

## 2016-04-14 LAB — COMPREHENSIVE METABOLIC PANEL
ALT: 23 U/L (ref 14–54)
AST: 28 U/L (ref 15–41)
Albumin: 3.8 g/dL (ref 3.5–5.0)
Alkaline Phosphatase: 161 U/L — ABNORMAL HIGH (ref 38–126)
Anion gap: 8 (ref 5–15)
BUN: 19 mg/dL (ref 6–20)
CO2: 24 mmol/L (ref 22–32)
Calcium: 8.6 mg/dL — ABNORMAL LOW (ref 8.9–10.3)
Chloride: 104 mmol/L (ref 101–111)
Creatinine, Ser: 1.35 mg/dL — ABNORMAL HIGH (ref 0.44–1.00)
GFR calc Af Amer: 44 mL/min — ABNORMAL LOW (ref >60)
GFR calc non Af Amer: 38 mL/min — ABNORMAL LOW (ref >60)
Glucose, Bld: 94 mg/dL (ref 65–99)
Potassium: 4 mmol/L (ref 3.5–5.1)
Sodium: 136 mmol/L (ref 135–145)
Total Bilirubin: 0.6 mg/dL (ref 0.3–1.2)
Total Protein: 6.5 g/dL (ref 6.5–8.1)

## 2016-04-14 LAB — CBC
HCT: 36.7 % (ref 35.0–47.0)
Hemoglobin: 12.3 g/dL (ref 12.0–16.0)
MCH: 29.8 pg (ref 26.0–34.0)
MCHC: 33.4 g/dL (ref 32.0–36.0)
MCV: 89.3 fL (ref 80.0–100.0)
Platelets: 288 10*3/uL (ref 150–440)
RBC: 4.11 MIL/uL (ref 3.80–5.20)
RDW: 19.7 % — ABNORMAL HIGH (ref 11.5–14.5)
WBC: 10.3 10*3/uL (ref 3.6–11.0)

## 2016-04-14 LAB — TROPONIN I: Troponin I: <0.03 ng/mL (ref <0.03)

## 2016-04-14 MED ORDER — ACETAMINOPHEN 500 MG PO TABS
ORAL_TABLET | ORAL | Status: AC
  Administered 2016-04-14: 1000 mg via ORAL
  Filled 2016-04-14: qty 2

## 2016-04-14 MED ORDER — ACETAMINOPHEN 500 MG PO TABS
1000.0000 mg | ORAL_TABLET | Freq: Once | ORAL | Status: AC
  Administered 2016-04-14: 1000 mg via ORAL

## 2016-04-14 MED ORDER — SODIUM CHLORIDE 0.9 % IV SOLN
1000.0000 mL | Freq: Once | INTRAVENOUS | Status: AC
  Administered 2016-04-14: 1000 mL via INTRAVENOUS

## 2016-04-14 NOTE — ED Provider Notes (Signed)
[facility] Emergency Department Provider Note   ____________________________________________    I have reviewed the triage vital signs and the nursing notes.   HISTORY  Chief Complaint Loss of Consciousness     HPI Kerri Carter is a 76 y.o. female who presents with complaints of syncopal episode. Patient was having endometrial biopsy today at OB/GYN, she describes the procedure is very painful. When she got up afterwards she apparently syncopized, but no fall. Currently she feels well and has no complaints. She reports a history of conversion disorder which sometimes makes her stutter but she reports it has resolved. No head injury. No deficits. No headache. No abdominal pain, no chest pain.   Past Medical History:  Diagnosis Date  . Anxiety   . Chicken pox   . CKD (chronic kidney disease), stage III    a. s/p R nephrectomy.  . Conversion disorder   . COPD (chronic obstructive pulmonary disease) ([place])   . Depression   . Diabetes mellitus without complication ([place])    Patient denies Diabetes, does not take any diabetic medications  . Essential hypertension   . GERD (gastroesophageal reflux disease)   . Hyperlipidemia   . Inflammatory arthritis    a. hands/carpal tunnel.  b. Low titer rheumatoid factor. c. Negative anti-CCP antibodies. d. Plaquenil.  . Non-Obstructive CAD    a. 07/2009 Cath ([facility]): nonobs dzs;  b. 03/2011 Cath [facility]): nonobs dzs.  . Osteoarthritis    a. Knees.  . PUD (peptic ulcer disease)   . Toxic maculopathy   . Valvular heart disease    a. 07/2015 Echo: EF 55-60%, Mild AI, AS, MR, and TR.    Patient Active Problem List   Diagnosis Date Noted  . LLQ pain 02/29/2016  . Mixed bipolar I disorder ([place]) 10/09/2015  . CKD (chronic kidney disease), stage III   . Essential hypertension   . Anemia 08/15/2015  . Conversion disorder 08/04/2015  . Anxiety and depression 07/17/2015  . Hyperlipidemia 07/17/2015  . 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 Macular degeneration 07/17/2015  . Insomnia 07/17/2015  . Rheumatoid arthritis ([place]) 12/05/2014  . Valvular heart disease 10/13/2011  . COPD (chronic obstructive pulmonary disease) ([place]) 09/09/2011  . OSA (obstructive sleep apnea) 09/09/2011  . Nocturnal hypoxemia 09/09/2011  . Gastroesophageal reflux disease 02/24/2011  . Dyslipidemia 02/24/2011  . Nonrheumatic aortic valve insufficiency 02/24/2011  . Non-rheumatic mitral regurgitation 02/24/2011    Past Surgical History:  Procedure Laterality Date  . APPENDECTOMY    . BUNIONECTOMY    . CESAREAN SECTION     x1  . COLONOSCOPY WITH PROPOFOL N/A 04/02/2016   Procedure: COLONOSCOPY WITH PROPOFOL;  Surgeon: Jonathon Bellows, MD;  Location: [facility] ENDOSCOPY;  Service: Endoscopy;  Laterality: N/A;  . ESOPHAGEAL DILATION  04/02/2016   Procedure: ESOPHAGEAL DILATION;  Surgeon: Jonathon Bellows, MD;  Location: [facility] ENDOSCOPY;  Service: Endoscopy;;  . ESOPHAGOGASTRODUODENOSCOPY (EGD) WITH PROPOFOL N/A 04/02/2016   Procedure: ESOPHAGOGASTRODUODENOSCOPY (EGD) WITH PROPOFOL;  Surgeon: Jonathon Bellows, MD;  Location: [facility] ENDOSCOPY;  Service: Endoscopy;  Laterality: N/A;  . 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 TONSILLECTOMY    . TOTAL HIP ARTHROPLASTY  12/10/11   [facility] left hip  . TUBAL LIGATION      Prior to Admission medications   Medication Sig Start Date End Date Taking? Authorizing Provider  albuterol (VENTOLIN HFA) 108 (90 Base) MCG/ACT inhaler Inhale 2  puffs into the lungs every 6 (six) hours as needed. 11/25/15  Yes Juanito Doom, MD  aspirin 81 MG tablet Take 81 mg by mouth daily.   Yes Historical Provider, MD  BREO ELLIPTA 100-25 MCG/INH AEPB Inhale 1 puff into the lungs daily.  02/04/16  Yes Historical Provider, MD  escitalopram (LEXAPRO) 10 MG tablet take 1 tablet by mouth once daily 03/06/16  Yes Rainey Pines, MD  folic acid (FOLVITE) 1 MG  tablet Take 1 mg by mouth daily.   Yes Historical Provider, MD  furosemide (LASIX) 20 MG tablet take 1 tablet by mouth once daily 09/09/15  Yes Coral Spikes, DO  gabapentin (NEURONTIN) 300 MG capsule take 1 capsule by mouth four times a day Patient taking differently: take 1 capsule by mouth three times a day 02/21/16  Yes Coral Spikes, DO  HYDROcodone-acetaminophen (NORCO/VICODIN) 5-325 MG tablet take 1 to 2 tablets by mouth every 6 to 8 hours if needed for pain 01/07/16  Yes Historical Provider, MD  lamoTRIgine (LAMICTAL) 100 MG tablet Take 1 tablet (100 mg total) by mouth 2 (two) times daily. 12/31/15  Yes Rainey Pines, MD  LORazepam (ATIVAN) 0.5 MG tablet Take 1 tablet (0.5 mg total) by mouth at bedtime. 12/31/15  Yes Rainey Pines, MD  lovastatin (MEVACOR) 20 MG tablet Take 1 tablet (20 mg total) by mouth at bedtime. 10/22/15  Yes Coral Spikes, DO  methotrexate 50 MG/2ML injection Inject 250 mg into the skin once a week. Inject 10 ml once weekly. Patient takes on Monday. 09/27/14  Yes Historical Provider, MD  montelukast (SINGULAIR) 10 MG tablet take 1 tablet by mouth once daily 12/02/15  Yes Jayce G Cook, DO  multivitamin-lutein (OCUVITE-LUTEIN) CAPS capsule Take 1 capsule by mouth 2 (two) times daily.   Yes Historical Provider, MD  nebivolol (BYSTOLIC) 10 MG tablet Take 1 tablet (10 mg total) by mouth daily. Patient taking differently: Take 5 mg by mouth daily.  07/23/15  Yes Juanito Doom, MD  pantoprazole (PROTONIX) 40 MG tablet take 1 tablet by mouth twice a day as directed 04/14/16  Yes Coral Spikes, DO  potassium chloride (K-DUR) 10 MEQ tablet take 1 tablet by mouth once daily 09/09/15  Yes Coral Spikes, DO  QUEtiapine (SEROQUEL) 25 MG tablet take 1 tablet by mouth at bedtime 02/24/16  Yes Rainey Pines, MD  RA NASAL ALLERGY 55 MCG/ACT AERO nasal inhaler instill 2 sprays into each nostril once daily 01/22/16  Yes Historical Provider, MD  ranitidine (ZANTAC) 150 MG capsule take 1 capsule by mouth  twice a day 12/03/15  Yes Coral Spikes, DO  BD INSULIN SYRINGE ULTRAFINE 31G X 15/64" 1 ML MISC  02/04/16   Historical Provider, MD  chlorpheniramine-HYDROcodone (TUSSIONEX PENNKINETIC ER) 10-8 MG/5ML SUER Take 5 mLs by mouth every 12 (twelve) hours as needed. Patient not taking: Reported on 04/14/2016 01/30/16   Coral Spikes, DO  dicyclomine (BENTYL) 20 MG tablet Take 1 tablet (20 mg total) by mouth 3 (three) times daily as needed for spasms. Patient not taking: Reported on 04/14/2016 02/25/16   Daymon Larsen, MD  DUREZOL 0.05 % EMUL instill 1 drop into left eye twice a day 01/23/16   Historical Provider, MD  polyethylene glycol (MIRALAX) packet Take 17 g by mouth daily. Patient not taking: Reported on 04/14/2016 02/25/16   Daymon Larsen, MD     Allergies Ceftin [cefuroxime axetil]; Lisinopril; Antihistamines, chlorpheniramine-type; Antivert [meclizine hcl]; Decongestant [pseudoephedrine hcl]; Doxycycline;  Morphine and related; Polymyxin b; Sulfa antibiotics; Xarelto [rivaroxaban]; Adhesive [tape]; Iodine; Levaquin [levofloxacin in d5w]; and Tetanus toxoids  Family History  Problem Relation Age of Onset  . Rheum arthritis Mother   . Asthma Mother   . Parkinson's disease Mother   . Heart disease Mother   . Stroke Mother   . Hypertension Mother   . Heart attack Father   . Heart disease Father   . Hypertension Father   . Diabetes Son   . Asthma Sister   . Heart disease Sister   . Lung cancer Sister   . Heart disease Sister   . Heart disease Sister   . Breast cancer Sister   . Heart attack Sister   . Heart disease Brother   . Heart disease Maternal Grandmother   . Diabetes Maternal Grandmother   . Colon cancer Maternal Grandmother   . Heart disease Brother   . Alcohol abuse Brother   . Depression Brother     Social History Social History  Substance Use Topics  . Smoking status: Former Smoker    Packs/day: 0.50    Years: 20.00    Types: Cigarettes    Quit date: 02/02/1974  .  Smokeless tobacco: Never Used  . Alcohol use No    Review of Systems  Constitutional: No fever/chills Eyes: No visual changes.   Cardiovascular: Denies chest pain. Respiratory: Denies shortness of breath. Gastrointestinal: No abdominal pain.  No nausea, no vomiting.   Genitourinary: Negative for dysuria. Musculoskeletal: Negative for back pain. Skin: Negative for rash. Neurological: Negative for headaches  10-point ROS otherwise negative.  ____________________________________________   PHYSICAL EXAM:  VITAL SIGNS: ED Triage Vitals [04/14/16 1132]  Enc Vitals Group     BP (!) 191/73     Pulse Rate 69     Resp 12     Temp 97.9 F (36.6 C)     Temp Source Oral     SpO2 98 %     Weight 165 lb (74.8 kg)     Height 5\' 4"  (1.626 m)     Head Circumference      Peak Flow      Pain Score 6     Pain Loc      Pain Edu?      Excl. in [place]?     Constitutional: Alert and oriented. No acute distress. Pleasant and interactive Eyes: Conjunctivae are normal.   Nose: No congestion/rhinnorhea. Mouth/Throat: Mucous membranes are moist.   Neck:  Painless ROM Cardiovascular: Normal rate, regular rhythm. Grossly normal heart sounds.  Good peripheral circulation. Respiratory: Normal respiratory effort.  No retractions. Lungs CTAB. Gastrointestinal: Soft and nontender. No distention.  No CVA tenderness.  Musculoskeletal: No lower extremity tenderness nor edema.  Warm and well perfused Neurologic:  Normal speech and language. No gross focal neurologic deficits are appreciated.  Skin:  Skin is warm, dry and intact. No rash noted. Psychiatric: Mood and affect are normal. Speech and behavior are normal.  ____________________________________________   LABS (all labs ordered are listed, but only abnormal results are displayed)  Labs Reviewed  CBC - Abnormal; Notable for the following:       Result Value   RDW 19.7 (*)    All other components within normal limits  COMPREHENSIVE  METABOLIC PANEL - Abnormal; Notable for the following:    Creatinine, Ser 1.35 (*)    Calcium 8.6 (*)    Alkaline Phosphatase 161 (*)    GFR calc non Af Amer 38 (*)  GFR calc Af Amer 44 (*)    All other components within normal limits  TROPONIN I   ____________________________________________  EKG  ED ECG REPORT I, Lavonia Drafts, the attending physician, personally viewed and interpreted this ECG.  Date: 04/14/2016  Rhythm: normal sinus rhythm QRS Axis: normal Intervals: normal ST/T Wave abnormalities: normal Conduction Disturbances: none Narrative Interpretation: unremarkable  ____________________________________________  RADIOLOGY  None ____________________________________________   PROCEDURES  Procedure(s) performed: No    Critical Care performed: No ____________________________________________   INITIAL IMPRESSION / ASSESSMENT AND PLAN / ED COURSE  Pertinent labs & imaging results that were available during my care of the patient were reviewed by me and considered in my medical decision making (see chart for details).  Patient well-appearing and in no acute distress. Suspect vasovagal syncope. We will treat with IV fluids, check labs, EKG and reevaluate  Workup is reassuring. Orthostatics are normal. Patient feels quite well and is anxious to go. I feel this is result. Discussed with her GYN who also agrees with this plan of action    ____________________________________________   FINAL CLINICAL IMPRESSION(S) / ED DIAGNOSES  Final diagnoses:  Syncope and collapse      NEW MEDICATIONS STARTED DURING THIS VISIT:  New Prescriptions   No medications on file     Note:  This document was prepared using Dragon voice recognition software and may include unintentional dictation errors.    Lavonia Drafts, MD 04/14/16 [phone]

## 2016-04-14 NOTE — Addendum Note (Signed)
Addended by: Raliegh Ip on: 04/14/2016 11:48 AM   Modules accepted: Orders

## 2016-04-14 NOTE — Progress Notes (Addendum)
HPI:      [name] is a 76 y.o. [id number] who LMP was No LMP recorded. Patient is postmenopausal.  Subjective:   She presents today  Stating that she had one episode of postmenopausal bleeding 2 weeks ago. She had a pelvic ultrasound revealing a small fluid collection in the lower uterine segment. She had a very thin endometrium. She is not taking HRT.    Hx: The following portions of the patient's history were reviewed and updated as appropriate:              She  has a past medical history of Anxiety; Chicken pox; CKD (chronic kidney disease), stage III; Conversion disorder; COPD (chronic obstructive pulmonary disease) ([place]); Depression; Diabetes mellitus without complication ([place]); Essential hypertension; GERD (gastroesophageal reflux disease); Hyperlipidemia; Inflammatory arthritis; Non-Obstructive CAD; Osteoarthritis; PUD (peptic ulcer disease); Toxic maculopathy; and Valvular heart disease. She  does not have any pertinent problems on file. She  has a past surgical history that includes Nephrectomy (1988); Tubal ligation; Appendectomy; Tonsillectomy; Cesarean section; Bunionectomy; Total hip arthroplasty (12/10/11); Colonoscopy with propofol (N/A, 04/02/2016); Esophagogastroduodenoscopy (egd) with propofol (N/A, 04/02/2016); and Esophageal dilation (04/02/2016). Her family history includes Alcohol abuse in her brother; Asthma in her mother and sister; Breast cancer in her sister; Colon cancer in her maternal grandmother; Depression in her brother; Diabetes in her maternal grandmother and son; Heart attack in her father and sister; Heart disease in her brother, brother, father, maternal grandmother, mother, sister, sister, and sister; Hypertension in her father and mother; Lung cancer in her sister; Parkinson's disease in her mother; Rheum arthritis in her mother; Stroke in her mother. She  reports that she quit smoking about 42 years ago. Her smoking use included Cigarettes. She has a 10.00  pack-year smoking history. She has never used smokeless tobacco. She reports that she does not drink alcohol or use drugs. Current Outpatient Prescriptions on File Prior to Visit  Medication Sig Dispense Refill  . albuterol (VENTOLIN HFA) 108 (90 Base) MCG/ACT inhaler Inhale 2 puffs into the lungs every 6 (six) hours as needed. 1 Inhaler 5  . aspirin 81 MG tablet Take 81 mg by mouth daily.    Marland Kitchen BREO ELLIPTA 100-25 MCG/INH AEPB   0  . chlorpheniramine-HYDROcodone (TUSSIONEX PENNKINETIC ER) 10-8 MG/5ML SUER Take 5 mLs by mouth every 12 (twelve) hours as needed. 115 mL 0  . escitalopram (LEXAPRO) 10 MG tablet take 1 tablet by mouth once daily 30 tablet 1  . folic acid (FOLVITE) 1 MG tablet Take 1 mg by mouth daily.    . furosemide (LASIX) 20 MG tablet take 1 tablet by mouth once daily 30 tablet 4  . gabapentin (NEURONTIN) 300 MG capsule take 1 capsule by mouth four times a day 120 capsule 2  . HYDROcodone-acetaminophen (NORCO/VICODIN) 5-325 MG tablet take 1 to 2 tablets by mouth every 6 to 8 hours if needed for pain  0  . lamoTRIgine (LAMICTAL) 100 MG tablet Take 1 tablet (100 mg total) by mouth 2 (two) times daily. 60 tablet 2  . LORazepam (ATIVAN) 0.5 MG tablet Take 1 tablet (0.5 mg total) by mouth at bedtime. 30 tablet 1  . lovastatin (MEVACOR) 20 MG tablet Take 1 tablet (20 mg total) by mouth at bedtime. 90 tablet 3  . methotrexate 50 MG/2ML injection Inject 250 mg into the skin once a week. Inject 10 ml once weekly. Patient takes on Monday.    . montelukast (SINGULAIR) 10 MG tablet take 1  tablet by mouth once daily 90 tablet 1  . multivitamin-lutein (OCUVITE-LUTEIN) CAPS capsule Take 1 capsule by mouth 2 (two) times daily.    . nebivolol (BYSTOLIC) 10 MG tablet Take 1 tablet (10 mg total) by mouth daily. (Patient taking differently: Take 5 mg by mouth daily. ) 30 tablet 3  . pantoprazole (PROTONIX) 40 MG tablet Take 40 mg by mouth 2 (two) times daily.     . potassium chloride (K-DUR) 10 MEQ  tablet take 1 tablet by mouth once daily 30 tablet 4  . QUEtiapine (SEROQUEL) 25 MG tablet take 1 tablet by mouth at bedtime 30 tablet 1  . ranitidine (ZANTAC) 150 MG capsule take 1 capsule by mouth twice a day 60 capsule 5  . BD INSULIN SYRINGE ULTRAFINE 31G X 15/64" 1 ML MISC   0  . dicyclomine (BENTYL) 20 MG tablet Take 1 tablet (20 mg total) by mouth 3 (three) times daily as needed for spasms. (Patient not taking: Reported on 04/14/2016) 30 tablet 0  . DUREZOL 0.05 % EMUL instill 1 drop into left eye twice a day  0  . polyethylene glycol (MIRALAX) packet Take 17 g by mouth daily. (Patient not taking: Reported on 04/14/2016) 14 each 0  . polyethylene glycol powder (GLYCOLAX/MIRALAX) powder take as directed for COLONIC PREP  0  . RA NASAL ALLERGY 55 MCG/ACT AERO nasal inhaler instill 2 sprays into each nostril once daily  0   No current facility-administered medications on file prior to visit.          Review of Systems:  Review of Systems  Constitutional: Denied constitutional symptoms, night sweats, recent illness, fatigue, fever, insomnia and weight loss.  Eyes: Denied eye symptoms, eye pain, photophobia, vision change and visual disturbance.  Ears/Nose/Throat/Neck: Denied ear, nose, throat or neck symptoms, hearing loss, nasal discharge, sinus congestion and sore throat.  Cardiovascular: Denied cardiovascular symptoms, arrhythmia, chest pain/pressure, edema, exercise intolerance, orthopnea and palpitations.  Respiratory: Denied pulmonary symptoms, asthma, pleuritic pain, productive sputum, cough, dyspnea and wheezing.  Gastrointestinal: Denied, gastro-esophageal reflux, melena, nausea and vomiting.  Genitourinary: See HPI for additional information.  Musculoskeletal: Denied musculoskeletal symptoms, stiffness, swelling, muscle weakness and myalgia.  Dermatologic: Denied dermatology symptoms, rash and scar.  Neurologic: Denied neurology symptoms, dizziness, headache, neck pain and  syncope.  Psychiatric: Denied psychiatric symptoms, anxiety and depression.  Endocrine: Denied endocrine symptoms including hot flashes and night sweats.   Meds:   Current Outpatient Prescriptions on File Prior to Visit  Medication Sig Dispense Refill  . albuterol (VENTOLIN HFA) 108 (90 Base) MCG/ACT inhaler Inhale 2 puffs into the lungs every 6 (six) hours as needed. 1 Inhaler 5  . aspirin 81 MG tablet Take 81 mg by mouth daily.    Marland Kitchen BREO ELLIPTA 100-25 MCG/INH AEPB   0  . chlorpheniramine-HYDROcodone (TUSSIONEX PENNKINETIC ER) 10-8 MG/5ML SUER Take 5 mLs by mouth every 12 (twelve) hours as needed. 115 mL 0  . escitalopram (LEXAPRO) 10 MG tablet take 1 tablet by mouth once daily 30 tablet 1  . folic acid (FOLVITE) 1 MG tablet Take 1 mg by mouth daily.    . furosemide (LASIX) 20 MG tablet take 1 tablet by mouth once daily 30 tablet 4  . gabapentin (NEURONTIN) 300 MG capsule take 1 capsule by mouth four times a day 120 capsule 2  . HYDROcodone-acetaminophen (NORCO/VICODIN) 5-325 MG tablet take 1 to 2 tablets by mouth every 6 to 8 hours if needed for pain  0  .  lamoTRIgine (LAMICTAL) 100 MG tablet Take 1 tablet (100 mg total) by mouth 2 (two) times daily. 60 tablet 2  . LORazepam (ATIVAN) 0.5 MG tablet Take 1 tablet (0.5 mg total) by mouth at bedtime. 30 tablet 1  . lovastatin (MEVACOR) 20 MG tablet Take 1 tablet (20 mg total) by mouth at bedtime. 90 tablet 3  . methotrexate 50 MG/2ML injection Inject 250 mg into the skin once a week. Inject 10 ml once weekly. Patient takes on Monday.    . montelukast (SINGULAIR) 10 MG tablet take 1 tablet by mouth once daily 90 tablet 1  . multivitamin-lutein (OCUVITE-LUTEIN) CAPS capsule Take 1 capsule by mouth 2 (two) times daily.    . nebivolol (BYSTOLIC) 10 MG tablet Take 1 tablet (10 mg total) by mouth daily. (Patient taking differently: Take 5 mg by mouth daily. ) 30 tablet 3  . pantoprazole (PROTONIX) 40 MG tablet Take 40 mg by mouth 2 (two) times  daily.     . potassium chloride (K-DUR) 10 MEQ tablet take 1 tablet by mouth once daily 30 tablet 4  . QUEtiapine (SEROQUEL) 25 MG tablet take 1 tablet by mouth at bedtime 30 tablet 1  . ranitidine (ZANTAC) 150 MG capsule take 1 capsule by mouth twice a day 60 capsule 5  . BD INSULIN SYRINGE ULTRAFINE 31G X 15/64" 1 ML MISC   0  . dicyclomine (BENTYL) 20 MG tablet Take 1 tablet (20 mg total) by mouth 3 (three) times daily as needed for spasms. (Patient not taking: Reported on 04/14/2016) 30 tablet 0  . DUREZOL 0.05 % EMUL instill 1 drop into left eye twice a day  0  . polyethylene glycol (MIRALAX) packet Take 17 g by mouth daily. (Patient not taking: Reported on 04/14/2016) 14 each 0  . polyethylene glycol powder (GLYCOLAX/MIRALAX) powder take as directed for COLONIC PREP  0  . RA NASAL ALLERGY 55 MCG/ACT AERO nasal inhaler instill 2 sprays into each nostril once daily  0   No current facility-administered medications on file prior to visit.     Objective:     Vitals:   04/14/16 0935  BP: (!) 123/57  Pulse: 77              Physical examination   Pelvic:   Vulva: Normal appearance.  No lesions.  Vagina: No lesions or abnormalities noted. Atrophic   Support: Normal pelvic support.  Urethra No masses tenderness or scarring.  Meatus Normal size without lesions or prolapse.  Cervix: Normal appearance.  No lesions. Cervical stenosis   Anus: Normal exam.  No lesions.  Perineum: Normal exam.  No lesions.        Bimanual   Uterus: Normal size.  Non-tender.  Mobile.  AV.  Adnexae: No masses.  Non-tender to palpation.  Cul-de-sac: Negative for abnormality.   Endometrial Biopsy After discussion with the patient regarding her abnormal uterine bleeding I recommended that she proceed with an endometrial biopsy for further diagnosis. The risks, benefits, alternatives, and indications for an endometrial biopsy were discussed with the patient in detail. She understood the risks including  infection, bleeding, cervical laceration and uterine perforation.  Verbal consent was obtained.   PROCEDURE NOTE:  Vacurette endometrial biopsy was performed using aseptic technique with iodine preparation.  After using a lacrimal duct probe for dilation, the uterus was sounded to a length of 7 cm.  Adequate sampling was obtained with minimal blood loss.  Very small amount of tissue consistent with menopausal state.  The patient tolerated the procedure well.  Disposition will be pending pathology    Assessment:    [id number] Patient Active Problem List   Diagnosis Date Noted  . LLQ pain 02/29/2016  . Mixed bipolar I disorder ([place]) 10/09/2015  . CKD (chronic kidney disease), stage III   . Essential hypertension   . Anemia 08/15/2015  . Conversion disorder 08/04/2015  . Anxiety and depression 07/17/2015  . Hyperlipidemia 07/17/2015  . 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 Macular degeneration 07/17/2015  . Insomnia 07/17/2015  . Rheumatoid arthritis ([place]) 12/05/2014  . Valvular heart disease 10/13/2011  . COPD (chronic obstructive pulmonary disease) ([place]) 09/09/2011  . OSA (obstructive sleep apnea) 09/09/2011  . Nocturnal hypoxemia 09/09/2011  . Gastroesophageal reflux disease 02/24/2011  . Dyslipidemia 02/24/2011  . Nonrheumatic aortic valve insufficiency 02/24/2011  . Non-rheumatic mitral regurgitation 02/24/2011     1. Postmenopausal bleeding     Endometrial fluid collection on ultrasound. Likely secondary to cervical stenosis.    Plan:            1.  Endometrial biopsy performed-discuss results and management when pathology returns.    F/U  Return in about 1 week (around 04/21/2016).  Finis Bud, M.D. 04/14/2016 10:42 AM   After completing the endometrial biopsy, she asked to stand up. She was assisted off the table and walked across the room and sat in the chair with the intention of getting dressed. It was then that she became  lightheaded and seemed to pass out. Her feet were elevated and she was given ammonia capsule under her nose. She initially responded to this but then within a few seconds became lightheaded and passed out again in the chair. She was carefully lifted and placed on the floor with her feet elevated. Respiratory rate and pulse were monitored and remained stable and regular throughout. Her blood pressure was monitored. Of significant note her pulse seemed to remain approximately 60-70. EMS was called. They repeated the ammonia capsule and she responded but never seem to again complete consciousness.

## 2016-04-14 NOTE — ED Triage Notes (Signed)
Patient arrived by EMS from [facility]. Pt was getting fluid removed from her uterus. OBGYN MD reported sometimes they can hit a nerve and cause pt to vagal down which they believe is what happened. They stood patient up to walk her around and she had LOC. Staff lowered pt to floor and called EMS. Upon arrival pt is stuttering her words. She understands questions but reports she is unable to talk.

## 2016-04-15 ENCOUNTER — Encounter: Payer: Self-pay | Admitting: Urology

## 2016-04-15 ENCOUNTER — Ambulatory Visit: Payer: Medicare Other | Admitting: Urology

## 2016-04-15 VITALS — BP 119/61 | HR 76 | Ht 64.0 in | Wt 163.0 lb

## 2016-04-15 DIAGNOSIS — N2889 Other specified disorders of kidney and ureter: Secondary | ICD-10-CM | POA: Diagnosis not present

## 2016-04-15 DIAGNOSIS — R339 Retention of urine, unspecified: Secondary | ICD-10-CM

## 2016-04-15 NOTE — Progress Notes (Signed)
04/15/2016 7:56 PM   Kerri Carter [name] 1940/08/27 [id number]  Referring provider: Coral Spikes, DO [address] [place], [place] [address]  Chief Complaint  Patient presents with  . Follow-up    renal lesion    HPI: 76 year old female followed by Dr. Matilde Sprang for history of urinary retention and urinary frequency. She returns to the office today after an incidental finding on CT scan abd/ pelvis with contrast performed on 03/19/1848 performed for the purpose of evaluation of abdominal pain.  Per the report, there are multiple too small to characterize hypodense lesions within the left kidney which are subcentimeter as well as a subcentimeter lesion in the left kidney (lower pole) which supposedly is heterogeneous and suspicious for neoplasm.  Her right kidney is surgically absent.  CT scan report is available but no images available for review.  She is being worked up for a left adnexal mass and followed by OB/GYN.  She had a syncopal episode just yesterday following endometrial biopsy.  She does have baseline CKD, most recent creatinine 1.5 on 02/14/208.  She had a right nephrectomy 28 years ago following complications for renal artery "balloon" ? Stent which caused severe right renal atrophy.   She denies any flank pain or gross hematuria.   PMH: Past Medical History:  Diagnosis Date  . Anxiety   . Chicken pox   . CKD (chronic kidney disease), stage III    a. s/p R nephrectomy.  . Conversion disorder   . COPD (chronic obstructive pulmonary disease) ([place])   . Depression   . Diabetes mellitus without complication ([place])    Patient denies Diabetes, does not take any diabetic medications  . Essential hypertension   . GERD (gastroesophageal reflux disease)   . Hyperlipidemia   . Inflammatory arthritis    a. hands/carpal tunnel.  b. Low titer rheumatoid factor. c. Negative anti-CCP antibodies. d. Plaquenil.  . Non-Obstructive CAD    a. 07/2009 Cath ([facility]): nonobs  dzs;  b. 03/2011 Cath [facility]): nonobs dzs.  . Osteoarthritis    a. Knees.  . PUD (peptic ulcer disease)   . Toxic maculopathy   . Valvular heart disease    a. 07/2015 Echo: EF 55-60%, Mild AI, AS, MR, and TR.    Surgical History: Past Surgical History:  Procedure Laterality Date  . APPENDECTOMY    . BUNIONECTOMY    . CESAREAN SECTION     x1  . COLONOSCOPY WITH PROPOFOL N/A 04/02/2016   Procedure: COLONOSCOPY WITH PROPOFOL;  Surgeon: Jonathon Bellows, MD;  Location: [facility] ENDOSCOPY;  Service: Endoscopy;  Laterality: N/A;  . ESOPHAGEAL DILATION  04/02/2016   Procedure: ESOPHAGEAL DILATION;  Surgeon: Jonathon Bellows, MD;  Location: [facility] ENDOSCOPY;  Service: Endoscopy;;  . ESOPHAGOGASTRODUODENOSCOPY (EGD) WITH PROPOFOL N/A 04/02/2016   Procedure: ESOPHAGOGASTRODUODENOSCOPY (EGD) WITH PROPOFOL;  Surgeon: Jonathon Bellows, MD;  Location: [facility] ENDOSCOPY;  Service: Endoscopy;  Laterality: N/A;  . 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 TONSILLECTOMY    . TOTAL HIP ARTHROPLASTY  12/10/11   [facility] left hip  . TUBAL LIGATION      Home Medications:  Allergies as of 04/15/2016      Reactions   Ceftin [cefuroxime Axetil] Anaphylaxis   Other reaction(s): Other (See Comments) REACTION: tongue and throat swell Other Reaction: TONGUE AND THROAT SWELLING REACTION: tongue and throat swell   Lisinopril Anaphylaxis   REACTION: tongue and throat swelling (onset 10-10-09)   Antihistamines, Chlorpheniramine-type  REACTION: makes pt hyper   Antivert [meclizine Hcl]    Other reaction(s): Unknown REACTION: bladder will not empty REACTION: bladder will not empty   Decongestant [pseudoephedrine Hcl]    Other reaction(s): Unknown REACTION: makes pt hyper REACTION: makes pt hyper   Doxycycline    REACTION: GI upset   Morphine And Related Other (See Comments)   Per patient, low blood pressure issues, following morphine, that requires action to raise it back up.   Polymyxin B      Medication was in eye drops.   Sulfa Antibiotics    Face swelling   Xarelto [rivaroxaban]    Other reaction(s): Other (See Comments) GI bleed Stomach burning, bleeding, and tar in stool GI bleed   Adhesive [tape] Rash   Iodine Hives, Rash   Levaquin [levofloxacin In D5w] Rash   Tetanus Toxoids    REACTION: rash, fever, hot to touch at injection site      Medication List       Accurate as of 04/15/16  7:56 PM. Always use your most recent med list.          albuterol 108 (90 Base) MCG/ACT inhaler Commonly known as:  VENTOLIN HFA Inhale 2 puffs into the lungs every 6 (six) hours as needed.   aspirin 81 MG tablet Take 81 mg by mouth daily.   BD INSULIN SYRINGE ULTRAFINE 31G X 15/64" 1 ML Misc Generic drug:  Insulin Syringe-Needle U-100   BREO ELLIPTA 100-25 MCG/INH Aepb Generic drug:  fluticasone furoate-vilanterol Inhale 1 puff into the lungs daily.   DUREZOL 0.05 % Emul Generic drug:  Difluprednate instill 1 drop into left eye twice a day   escitalopram 10 MG tablet Commonly known as:  LEXAPRO take 1 tablet by mouth once daily   folic acid 1 MG tablet Commonly known as:  FOLVITE Take 1 mg by mouth daily.   furosemide 20 MG tablet Commonly known as:  LASIX take 1 tablet by mouth once daily   gabapentin 300 MG capsule Commonly known as:  NEURONTIN take 1 capsule by mouth four times a day   HYDROcodone-acetaminophen 5-325 MG tablet Commonly known as:  NORCO/VICODIN take 1 to 2 tablets by mouth every 6 to 8 hours if needed for pain   lamoTRIgine 100 MG tablet Commonly known as:  LAMICTAL Take 1 tablet (100 mg total) by mouth 2 (two) times daily.   LORazepam 0.5 MG tablet Commonly known as:  ATIVAN Take 1 tablet (0.5 mg total) by mouth at bedtime.   lovastatin 20 MG tablet Commonly known as:  MEVACOR Take 1 tablet (20 mg total) by mouth at bedtime.   methotrexate 50 MG/2ML injection Inject 250 mg into the skin once a week. Inject 10 ml once weekly.  Patient takes on Monday.   montelukast 10 MG tablet Commonly known as:  SINGULAIR take 1 tablet by mouth once daily   multivitamin-lutein Caps capsule Take 1 capsule by mouth 2 (two) times daily.   nebivolol 10 MG tablet Commonly known as:  BYSTOLIC Take 1 tablet (10 mg total) by mouth daily.   pantoprazole 40 MG tablet Commonly known as:  PROTONIX take 1 tablet by mouth twice a day as directed   potassium chloride 10 MEQ tablet Commonly known as:  K-DUR take 1 tablet by mouth once daily   QUEtiapine 25 MG tablet Commonly known as:  SEROQUEL take 1 tablet by mouth at bedtime   RA NASAL ALLERGY 55 MCG/ACT Aero nasal inhaler Generic drug:  triamcinolone instill 2 sprays into each nostril once daily   ranitidine 150 MG capsule Commonly known as:  ZANTAC take 1 capsule by mouth twice a day       Allergies:  Allergies  Allergen Reactions  . Ceftin [Cefuroxime Axetil] Anaphylaxis    Other reaction(s): Other (See Comments) REACTION: tongue and throat swell Other Reaction: TONGUE AND THROAT SWELLING REACTION: tongue and throat swell  . Lisinopril Anaphylaxis    REACTION: tongue and throat swelling (onset 10-10-09)  . Antihistamines, Chlorpheniramine-Type     REACTION: makes pt hyper  . Antivert [Meclizine Hcl]     Other reaction(s): Unknown REACTION: bladder will not empty REACTION: bladder will not empty  . Decongestant [Pseudoephedrine Hcl]     Other reaction(s): Unknown REACTION: makes pt hyper REACTION: makes pt hyper  . Doxycycline     REACTION: GI upset  . Morphine And Related Other (See Comments)    Per patient, low blood pressure issues, following morphine, that requires action to raise it back up.  . Polymyxin B     Medication was in eye drops.  . Sulfa Antibiotics     Face swelling  . Xarelto [Rivaroxaban]     Other reaction(s): Other (See Comments) GI bleed Stomach burning, bleeding, and tar in stool GI bleed  . Adhesive [Tape] Rash  . Iodine  Hives and Rash  . Levaquin [Levofloxacin In D5w] Rash  . Tetanus Toxoids     REACTION: rash, fever, hot to touch at injection site    Family History: Family History  Problem Relation Age of Onset  . Rheum arthritis Mother   . Asthma Mother   . Parkinson's disease Mother   . Heart disease Mother   . Stroke Mother   . Hypertension Mother   . Heart attack Father   . Heart disease Father   . Hypertension Father   . Diabetes Son   . Asthma Sister   . Heart disease Sister   . Lung cancer Sister   . Heart disease Sister   . Heart disease Sister   . Breast cancer Sister   . Heart attack Sister   . Heart disease Brother   . Heart disease Maternal Grandmother   . Diabetes Maternal Grandmother   . Colon cancer Maternal Grandmother   . Heart disease Brother   . Alcohol abuse Brother   . Depression Brother     Social History:  reports that she quit smoking about 42 years ago. Her smoking use included Cigarettes. She has a 10.00 pack-year smoking history. She has never used smokeless tobacco. She reports that she does not drink alcohol or use drugs.  ROS: UROLOGY Frequent Urination?: No Hard to postpone urination?: Yes Burning/pain with urination?: No Get up at night to urinate?: Yes Leakage of urine?: Yes Urine stream starts and stops?: No Trouble starting stream?: No Do you have to strain to urinate?: No Blood in urine?: No Urinary tract infection?: No Sexually transmitted disease?: No Injury to kidneys or bladder?: No Painful intercourse?: No Weak stream?: No Currently pregnant?: No Vaginal bleeding?: No Last menstrual period?: n  Gastrointestinal Nausea?: No Vomiting?: No Indigestion/heartburn?: Yes Diarrhea?: No Constipation?: No  Constitutional Fever: No Night sweats?: Yes Weight loss?: No Fatigue?: Yes  Skin Skin rash/lesions?: No Itching?: No  Eyes Blurred vision?: No Double vision?: No  Ears/Nose/Throat Sore throat?: No Sinus problems?:  No  Hematologic/Lymphatic Swollen glands?: No Easy bruising?: Yes  Cardiovascular Leg swelling?: Yes Chest pain?: No  Respiratory Cough?: No  Shortness of breath?: Yes  Endocrine Excessive thirst?: No  Musculoskeletal Back pain?: No Joint pain?: Yes  Neurological Headaches?: No Dizziness?: No  Psychologic Depression?: Yes Anxiety?: Yes  Physical Exam: BP 119/61   Pulse 76   Ht 5\' 4"  (1.626 m)   Wt 163 lb (73.9 kg)   BMI 27.98 kg/m   Constitutional:  Alert and oriented, No acute distress. HEENT: [place] AT, moist mucus membranes.  Trachea midline, no masses. Cardiovascular: No clubbing, cyanosis, or edema. Respiratory: Normal respiratory effort, no increased work of breathing. Obese. GI: Abdomen is soft, nontender, nondistended, no abdominal masses GU: No CVA tenderness.  Skin: No rashes, bruises or suspicious lesions. Neurologic: Grossly intact, no focal deficits, moving all 4 extremities. Psychiatric: Normal mood and affect.  Laboratory Data: Lab Results  Component Value Date   WBC 10.3 04/14/2016   HGB 12.3 04/14/2016   HCT 36.7 04/14/2016   MCV 89.3 04/14/2016   PLT 288 04/14/2016    Lab Results  Component Value Date   CREATININE 1.35 (H) 04/14/2016    Lab Results  Component Value Date   HGBA1C 5.5 09/27/2015    Urinalysis Component     Latest Ref Rng & Units 02/25/2016  Color, Urine     YELLOW STRAW (A)  Appearance     CLEAR CLEAR (A)  Specific Gravity, Urine     1.005 - 1.030 1.005  pH     5.0 - 8.0 6.0  Glucose     NEGATIVE mg/dL NEGATIVE  Hgb urine dipstick     NEGATIVE NEGATIVE  Bilirubin Urine     NEGATIVE NEGATIVE  Ketones, ur     NEGATIVE mg/dL NEGATIVE  Protein     NEGATIVE mg/dL NEGATIVE  Nitrite     NEGATIVE NEGATIVE  Leukocytes, UA     NEGATIVE NEGATIVE  RBC / HPF     0 - 5 RBC/hpf 0-5  WBC, UA     0 - 5 WBC/hpf 0-5  Bacteria, UA     NONE SEEN NONE SEEN  Squamous Epithelial / LPF     NONE SEEN 0-5 (A)     Pertinent Imaging: CT abdomen and pelvis with IV contrast  Comparison:Outside hospital chest CT dated 02/13/2009.  Indication:lower abd pain, distention, melena and reduced UOP. hx R nephrectomy, R10.32 Left lower quadrant pain.  Technique:CT imaging was performed of the abdomen and pelvis following the uncomplicated administration of intravenous contrast (Isovue-300, 150 mL at 3 mL/sec).Iodinated contrast was used due to the indications for the examination, to improve disease detection and further define anatomy. The most recent serum creatinine is 1.5 mg/dL. Coronal and sagittal reformatted images were generated and reviewed.  Findings:  Lung bases show mild bibasilar atelectasis.  No focal hepatic lesion. Cholelithiasis, without gallbladder wall thickening or pericholecystic fluid. No biliary duct dilation. The pancreas, spleen, and adrenal glands are unremarkable. Right kidney is absent. Too small to characterize hypodense lesions within the left kidney, with note that a subcentimeter lesion in the lower pole of the left kidney is heterogeneous and suspicious for neoplasm. No hydronephrosis. Small calcified renal artery aneurysm is noted. There is mild prominence of the mid to distal left ureter, with note that the distal ureter cannot be evaluated secondary to extensive streak artifact from left hip arthroplasty.  Bowel is nonobstructed. No pneumoperitoneum identified. Bladder is decompressed around a Foley catheter. Pelvic organs are not well-visualized secondary to extensive streak artifact from left hip arthroplasty. No discrete pelvic lymphadenopathy is identified. There is a  left adnexal mass (series 5, image 106) measuring up to 1.7 cm.  Bones are mildly osteopenic. Multilevel degenerative changes of the thoracolumbar spine. No suspicious focal osseous lesions identified.  Impression: 1.No CT findings to explain patient's acute abdominal  pain. 2.Incidentally noted 1.7 cm left adnexal lesion is abnormal for patient's age. Recommend further evaluation with pelvic ultrasound.  Finding #1 discussed with Dr. Leta Speller by Dr. Grandville Silos at 01:31 on 03/19/2016.  Finding #2 discussed with Randalyn Rhea RN by Milinda Pointer at 09:54 on 03/19/2016.   Electronically Reviewed UX:NATF Augustin Coupe, MD, [facility] Electronically Reviewed on:03/19/2016 9:54 AM  I have reviewed the images and concur with the above findings.  Electronically Signed TD:DUKGU Tomi Likens, MD, [facility] Electronically Signed on:03/19/2016 11:23 AM  Report today was reviewed, unable to review images. Advised patient to bring in disc.  Assessment & Plan:   1. Left renal mass Incidental finding of small, subcentimeter possible left renal mass based on outside imaging, unable to review today in a solitary kidney. We discussed that given the very small size of the lesion, it is likely best to follow the lesion until it reached at least 1 cm for better characterization prior to consideration of any intervention. We discussed that these lesions, if malignant, however very low metastatic potential and relatively slow growth rate.  Given her IV contrast allergy, we'll follow-up in 6 months with a renal ultrasound. She understands the extreme importance for follow-up and the consequences of not doing so. All of her questions were answered today.  - US Renal; Future  2. Incomplete bladder emptying Clinically doing well, will check a bladder scan next visit     Return in about 6 months (around 10/16/2016) for RUS, PVR.  Hollice Espy, MD  [facility] [address] [place], [place] [address] [phone]

## 2016-04-16 ENCOUNTER — Telehealth: Payer: Self-pay

## 2016-04-16 LAB — PATHOLOGY

## 2016-04-16 NOTE — Telephone Encounter (Signed)
-----   Message from Jonathon Bellows, MD sent at 04/16/2016  2:31 PM EDT ----- EGD -biospies showed gastritis and duodenitis- suggest avoid NSAID's and take omeprazole 20 mg once daily if having abdominal pain , if not then no need to take Colonoscopy -mild colitis was seen which was acute and can be seen if she has been taking NSAID's. 3 polyps were also seen , suggest repeat colonoscopy in 3 years

## 2016-04-21 ENCOUNTER — Telehealth: Payer: Self-pay

## 2016-04-21 NOTE — Telephone Encounter (Signed)
Patient informed of negative results per provider. Will keep scheduled appointment on 04/23/16.

## 2016-04-21 NOTE — Telephone Encounter (Signed)
-----   Message from Harlin Heys, MD sent at 04/20/2016 10:23 PM EDT ----- No hyperplasia or malignancy - Normal for menopause

## 2016-04-22 ENCOUNTER — Encounter: Payer: Self-pay | Admitting: Psychiatry

## 2016-04-22 ENCOUNTER — Other Ambulatory Visit: Payer: Self-pay | Admitting: Psychiatry

## 2016-04-22 ENCOUNTER — Ambulatory Visit (INDEPENDENT_AMBULATORY_CARE_PROVIDER_SITE_OTHER): Payer: 59 | Admitting: Psychiatry

## 2016-04-22 VITALS — BP 119/56 | HR 76 | Temp 98.4°F | Wt 166.6 lb

## 2016-04-22 DIAGNOSIS — F3160 Bipolar disorder, current episode mixed, unspecified: Secondary | ICD-10-CM

## 2016-04-22 MED ORDER — QUETIAPINE FUMARATE 25 MG PO TABS: 25.0000 mg | ORAL_TABLET | Freq: Every day | ORAL | 1 refills | Status: DC

## 2016-04-22 MED ORDER — LORAZEPAM 0.5 MG PO TABS: 0.5000 mg | ORAL_TABLET | Freq: Every day | ORAL | 1 refills | Status: DC

## 2016-04-22 MED ORDER — ESCITALOPRAM OXALATE 10 MG PO TABS: 10.0000 mg | ORAL_TABLET | Freq: Every day | ORAL | 1 refills | Status: DC

## 2016-04-22 MED ORDER — LAMOTRIGINE 100 MG PO TABS: 100.0000 mg | ORAL_TABLET | Freq: Two times a day (BID) | ORAL | 2 refills | Status: DC

## 2016-04-22 NOTE — Progress Notes (Signed)
Psychiatric MD Progress Note  Patient Identification: Kerri Carter MRN:  [id number] Date of Evaluation:  04/22/2016 Referral Source: PCP Chief Complaint:    Visit Diagnosis:    ICD-9-CM ICD-10-CM   1. Mixed bipolar I disorder (HCC) 296.60 F31.60     History of Present Illness:   Patient is a 76 year old white female who presented for follow up. Patient reported that she hasHas been taking her medications as prescribed. She was having medical issues and was seen in the emergency room couple of times. She was discussing them in detail. She reported that she has been compliant with her medications. She takes her Seroquel and lorazepam at night to help her sleep. She reported that she is having some memory problems and we discussed about decreasing the dose of lorazepam and she demonstratively understanding. Patient reported that she currently lives with her husband. She appeared calm and alert during the interview. She denied having any perceptual disturbances. She denied having any thoughts to hurt herself.     Associated Signs/Symptoms: Depression Symptoms:  insomnia, fatigue, feelings of worthlessness/guilt, anxiety, disturbed sleep, (Hypo) Manic Symptoms:  Impulsivity, Irritable Mood, Labiality of Mood, Anxiety Symptoms:  Excessive Worry, Psychotic Symptoms:  Hallucinations: Visual PTSD Symptoms: Negative NA  Past Psychiatric History: H/o conversion disorder.  Previous Psychotropic Medications:  Trazodone  ambien lexapro lamictal   Substance Abuse History in the last 12 months:  No.  Consequences of Substance Abuse: Negative NA  Past Medical History:  Past Medical History:  Diagnosis Date  . Anxiety   . Chicken pox   . CKD (chronic kidney disease), stage III    a. s/p R nephrectomy.  . Conversion disorder   . COPD (chronic obstructive pulmonary disease) ([place])   . Depression   . Diabetes mellitus without complication ([place])    Patient denies Diabetes, does  not take any diabetic medications  . Essential hypertension   . GERD (gastroesophageal reflux disease)   . Hyperlipidemia   . Inflammatory arthritis    a. hands/carpal tunnel.  b. Low titer rheumatoid factor. c. Negative anti-CCP antibodies. d. Plaquenil.  . Non-Obstructive CAD    a. 07/2009 Cath ([facility]): nonobs dzs;  b. 03/2011 Cath [facility] nonobs dzs.  . Osteoarthritis    a. Knees.  . PUD (peptic ulcer disease)   . Toxic maculopathy   . Valvular heart disease    a. 07/2015 Echo: EF 55-60%, Mild AI, AS, MR, and TR.    Past Surgical History:  Procedure Laterality Date  . APPENDECTOMY    . BUNIONECTOMY    . CESAREAN SECTION     x1  . COLONOSCOPY WITH PROPOFOL N/A 04/02/2016   Procedure: COLONOSCOPY WITH PROPOFOL;  Surgeon: Jonathon Bellows, MD;  Location: [facility] ENDOSCOPY;  Service: Endoscopy;  Laterality: N/A;  . ESOPHAGEAL DILATION  04/02/2016   Procedure: ESOPHAGEAL DILATION;  Surgeon: Jonathon Bellows, MD;  Location: [facility] ENDOSCOPY;  Service: Endoscopy;;  . ESOPHAGOGASTRODUODENOSCOPY (EGD) WITH PROPOFOL N/A 04/02/2016   Procedure: ESOPHAGOGASTRODUODENOSCOPY (EGD) WITH PROPOFOL;  Surgeon: Jonathon Bellows, MD;  Location: [facility] ENDOSCOPY;  Service: Endoscopy;  Laterality: N/A;  . 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 TONSILLECTOMY    . TOTAL HIP ARTHROPLASTY  12/10/11   [facility] left hip  . TUBAL LIGATION      Family Psychiatric History:  denied  Family History:  Family History  Problem Relation Age of Onset  . Rheum arthritis Mother   . Asthma Mother   .  Parkinson's disease Mother   . Heart disease Mother   . Stroke Mother   . Hypertension Mother   . Heart attack Father   . Heart disease Father   . Hypertension Father   . Diabetes Son   . Asthma Sister   . Heart disease Sister   . Lung cancer Sister   . Heart disease Sister   . Heart disease Sister   . Breast cancer Sister   . Heart attack Sister   . Heart disease Brother   . Heart disease  Maternal Grandmother   . Diabetes Maternal Grandmother   . Colon cancer Maternal Grandmother   . Heart disease Brother   . Alcohol abuse Brother   . Depression Brother     Social History:   Social History   Social History  . Marital status: Married    Spouse name: N/A  . Number of children: 2  . Years of education: Some Coll   Occupational History  . Retired    Social History Main Topics  . Smoking status: Former Smoker    Packs/day: 0.50    Years: 20.00    Types: Cigarettes    Quit date: 02/02/1974  . Smokeless tobacco: Never Used  . Alcohol use No  . Drug use: No  . Sexual activity: Not Currently   Other Topics Concern  . Not on file   Social History Narrative   Lives at home in [place] with her husband and grandson.   Right-handed.   6 cups coffee per day.    Additional Social History:  Married Lives with husband, grand son and his fiance.   Allergies:   Allergies  Allergen Reactions  . Ceftin [Cefuroxime Axetil] Anaphylaxis    Other reaction(s): Other (See Comments) REACTION: tongue and throat swell Other Reaction: TONGUE AND THROAT SWELLING REACTION: tongue and throat swell  . Lisinopril Anaphylaxis    REACTION: tongue and throat swelling (onset 10-10-09)  . Antihistamines, Chlorpheniramine-Type     REACTION: makes pt hyper  . Antivert [Meclizine Hcl]     Other reaction(s): Unknown REACTION: bladder will not empty REACTION: bladder will not empty  . Decongestant [Pseudoephedrine Hcl]     Other reaction(s): Unknown REACTION: makes pt hyper REACTION: makes pt hyper  . Doxycycline     REACTION: GI upset  . Morphine And Related Other (See Comments)    Per patient, low blood pressure issues, following morphine, that requires action to raise it back up.  . Polymyxin B     Medication was in eye drops.  . Sulfa Antibiotics     Face swelling  . Xarelto [Rivaroxaban]     Other reaction(s): Other (See Comments) GI bleed Stomach burning, bleeding,  and tar in stool GI bleed  . Adhesive [Tape] Rash  . Iodine Hives and Rash  . Levaquin [Levofloxacin In D5w] Rash  . Tetanus Toxoids     REACTION: rash, fever, hot to touch at injection site    Metabolic Disorder Labs: Lab Results  Component Value Date   HGBA1C 5.5 09/27/2015   No results found for: PROLACTIN No results found for: CHOL, TRIG, HDL, CHOLHDL, VLDL, LDLCALC   Current Medications: Current Outpatient Prescriptions  Medication Sig Dispense Refill  . albuterol (VENTOLIN HFA) 108 (90 Base) MCG/ACT inhaler Inhale 2 puffs into the lungs every 6 (six) hours as needed. 1 Inhaler 5  . aspirin 81 MG tablet Take 81 mg by mouth daily.    . BD INSULIN SYRINGE ULTRAFINE 31G X  15/64" 1 ML MISC   0  . BREO ELLIPTA 100-25 MCG/INH AEPB Inhale 1 puff into the lungs daily.   0  . DUREZOL 0.05 % EMUL instill 1 drop into left eye twice a day  0  . escitalopram (LEXAPRO) 10 MG tablet take 1 tablet by mouth once daily 30 tablet 1  . folic acid (FOLVITE) 1 MG tablet Take 1 mg by mouth daily.    . furosemide (LASIX) 20 MG tablet take 1 tablet by mouth once daily 30 tablet 4  . gabapentin (NEURONTIN) 300 MG capsule take 1 capsule by mouth four times a day (Patient taking differently: take 1 capsule by mouth three times a day) 120 capsule 2  . HYDROcodone-acetaminophen (NORCO/VICODIN) 5-325 MG tablet take 1 to 2 tablets by mouth every 6 to 8 hours if needed for pain  0  . lamoTRIgine (LAMICTAL) 100 MG tablet Take 1 tablet (100 mg total) by mouth 2 (two) times daily. 60 tablet 2  . LORazepam (ATIVAN) 0.5 MG tablet Take 1 tablet (0.5 mg total) by mouth at bedtime. 30 tablet 1  . lovastatin (MEVACOR) 20 MG tablet Take 1 tablet (20 mg total) by mouth at bedtime. 90 tablet 3  . methotrexate 50 MG/2ML injection Inject 250 mg into the skin once a week. Inject 10 ml once weekly. Patient takes on Monday.    . montelukast (SINGULAIR) 10 MG tablet take 1 tablet by mouth once daily 90 tablet 1  .  multivitamin-lutein (OCUVITE-LUTEIN) CAPS capsule Take 1 capsule by mouth 2 (two) times daily.    . nebivolol (BYSTOLIC) 10 MG tablet Take 1 tablet (10 mg total) by mouth daily. (Patient taking differently: Take 5 mg by mouth daily. ) 30 tablet 3  . pantoprazole (PROTONIX) 40 MG tablet take 1 tablet by mouth twice a day as directed 180 tablet 2  . potassium chloride (K-DUR) 10 MEQ tablet take 1 tablet by mouth once daily 30 tablet 4  . QUEtiapine (SEROQUEL) 25 MG tablet take 1 tablet by mouth at bedtime 30 tablet 1  . RA NASAL ALLERGY 55 MCG/ACT AERO nasal inhaler instill 2 sprays into each nostril once daily  0  . ranitidine (ZANTAC) 150 MG capsule take 1 capsule by mouth twice a day 60 capsule 5   No current facility-administered medications for this visit.     Neurologic: Headache: No Seizure: No Paresthesias:No  Musculoskeletal: Strength & Muscle Tone: within normal limits Gait & Station: normal Patient leans: N/A  Psychiatric Specialty Exam: ROS  There were no vitals taken for this visit.There is no height or weight on file to calculate BMI.  General Appearance: Casual  Eye Contact:  Fair  Speech:  Clear and Coherent  Volume:  Normal  Mood:  Anxious and Depressed  Affect:  Appropriate  Thought Process:  Coherent  Orientation:  Full (Time, Place, and Person)  Thought Content:  WDL  Suicidal Thoughts:  No  Homicidal Thoughts:  No  Memory:  Immediate;   Fair Recent;   Fair Remote;   Fair  Judgement:  Fair  Insight:  Fair  Psychomotor Activity:  Normal  Concentration:  Concentration: Fair and Attention Span: Fair  Recall:  [employer] of Knowledge:Fair  Language: Fair  Akathisia:  No  Handed:  Right  AIMS (if indicated):    Assets:  Communication Skills Desire for Improvement Physical Health  ADL's:  Intact  Cognition: WNL  Sleep:  poor    Treatment Plan Summary: Medication management  Discussed with patient about her medications. I will adjust her  medications as follows.  Continue lamotrigine 100 mg by mouth twice a day Continue  Lexapro 10 mg every morning Continue  lorazepam 0.5 mg -Advised patient to take half to 1 pill at night at bedtime and she demonstrated understanding. Continue  Seroquel 25 mg by mouth daily at bedtime for mood symptoms and depression. Discussed with her about the side effects in detail and she demonstrated understanding.  Follow-up in  2 months or earlier depending on her symptoms.   More than 50% of the time spent in psychoeducation, counseling and coordination of care.    This note was generated in part or whole with voice recognition software. Voice regonition is usually quite accurate but there are transcription errors that can and very often do occur. I apologize for any typographical errors that were not detected and corrected.    Rainey Pines, MD 3/21/20183:15 PM

## 2016-04-23 ENCOUNTER — Ambulatory Visit (INDEPENDENT_AMBULATORY_CARE_PROVIDER_SITE_OTHER): Payer: Medicare Other | Admitting: Obstetrics and Gynecology

## 2016-04-23 ENCOUNTER — Encounter: Payer: Self-pay | Admitting: Obstetrics and Gynecology

## 2016-04-23 VITALS — BP 119/73 | HR 73 | Wt 166.4 lb

## 2016-04-23 DIAGNOSIS — N950 Postmenopausal bleeding: Secondary | ICD-10-CM

## 2016-04-23 NOTE — Progress Notes (Signed)
HPI:      [name] is a 76 y.o. [id number] who LMP was No LMP recorded. Patient is postmenopausal.  Subjective:   She presents today To discuss her endometrial biopsy for postmenopausal bleeding. She was also seen to have an endometrial fluid collection by ultrasound. She had some spotting over the last several days but this has now resolved.    Hx: The following portions of the patient's history were reviewed and updated as appropriate:              She  has a past medical history of Anxiety; Chicken pox; CKD (chronic kidney disease), stage III; Conversion disorder; COPD (chronic obstructive pulmonary disease) ([place]); Depression; Diabetes mellitus without complication ([place]); Essential hypertension; GERD (gastroesophageal reflux disease); Hyperlipidemia; Inflammatory arthritis; Non-Obstructive CAD; Osteoarthritis; PUD (peptic ulcer disease); Toxic maculopathy; and Valvular heart disease. She  does not have any pertinent problems on file. She  has a past surgical history that includes Nephrectomy (1988); Tubal ligation; Appendectomy; Tonsillectomy; Cesarean section; Bunionectomy; Total hip arthroplasty (12/10/11); Colonoscopy with propofol (N/A, 04/02/2016); Esophagogastroduodenoscopy (egd) with propofol (N/A, 04/02/2016); and Esophageal dilation (04/02/2016). Her family history includes Alcohol abuse in her brother; Asthma in her mother and sister; Breast cancer in her sister; Colon cancer in her maternal grandmother; Depression in her brother; Diabetes in her maternal grandmother and son; Heart attack in her father and sister; Heart disease in her brother, brother, father, maternal grandmother, mother, sister, sister, and sister; Hypertension in her father and mother; Lung cancer in her sister; Parkinson's disease in her mother; Rheum arthritis in her mother; Stroke in her mother. She  reports that she quit smoking about 42 years ago. Her smoking use included Cigarettes. She has a 10.00 pack-year  smoking history. She has never used smokeless tobacco. She reports that she does not drink alcohol or use drugs. Current Outpatient Prescriptions on File Prior to Visit  Medication Sig Dispense Refill  . albuterol (VENTOLIN HFA) 108 (90 Base) MCG/ACT inhaler Inhale 2 puffs into the lungs every 6 (six) hours as needed. 1 Inhaler 5  . aspirin 81 MG tablet Take 81 mg by mouth daily.    . BD INSULIN SYRINGE ULTRAFINE 31G X 15/64" 1 ML MISC   0  . BREO ELLIPTA 100-25 MCG/INH AEPB Inhale 1 puff into the lungs daily.   0  . DUREZOL 0.05 % EMUL instill 1 drop into left eye twice a day  0  . escitalopram (LEXAPRO) 10 MG tablet Take 1 tablet (10 mg total) by mouth daily. 30 tablet 1  . folic acid (FOLVITE) 1 MG tablet Take 1 mg by mouth daily.    . furosemide (LASIX) 20 MG tablet take 1 tablet by mouth once daily 30 tablet 4  . gabapentin (NEURONTIN) 300 MG capsule take 1 capsule by mouth four times a day (Patient taking differently: take 1 capsule by mouth three times a day) 120 capsule 2  . HYDROcodone-acetaminophen (NORCO/VICODIN) 5-325 MG tablet take 1 to 2 tablets by mouth every 6 to 8 hours if needed for pain  0  . lamoTRIgine (LAMICTAL) 100 MG tablet Take 1 tablet (100 mg total) by mouth 2 (two) times daily. 60 tablet 2  . LORazepam (ATIVAN) 0.5 MG tablet Take 1 tablet (0.5 mg total) by mouth at bedtime. (Patient taking differently: Take 0.25 mg by mouth at bedtime. ) 30 tablet 1  . lovastatin (MEVACOR) 20 MG tablet Take 1 tablet (20 mg total) by mouth at bedtime. 90 tablet  3  . methotrexate 50 MG/2ML injection Inject 250 mg into the skin once a week. Inject 10 ml once weekly. Patient takes on Monday.    . montelukast (SINGULAIR) 10 MG tablet take 1 tablet by mouth once daily 90 tablet 1  . multivitamin-lutein (OCUVITE-LUTEIN) CAPS capsule Take 1 capsule by mouth 2 (two) times daily.    . nebivolol (BYSTOLIC) 10 MG tablet Take 1 tablet (10 mg total) by mouth daily. (Patient taking differently: Take 5  mg by mouth daily. ) 30 tablet 3  . pantoprazole (PROTONIX) 40 MG tablet take 1 tablet by mouth twice a day as directed 180 tablet 2  . potassium chloride (K-DUR) 10 MEQ tablet take 1 tablet by mouth once daily 30 tablet 4  . QUEtiapine (SEROQUEL) 25 MG tablet Take 1 tablet (25 mg total) by mouth at bedtime. 30 tablet 1  . RA NASAL ALLERGY 55 MCG/ACT AERO nasal inhaler instill 2 sprays into each nostril once daily  0  . ranitidine (ZANTAC) 150 MG capsule take 1 capsule by mouth twice a day 60 capsule 5   No current facility-administered medications on file prior to visit.          Review of Systems:  Review of Systems  Constitutional: Denied constitutional symptoms, night sweats, recent illness, fatigue, fever, insomnia and weight loss.  Eyes: Denied eye symptoms, eye pain, photophobia, vision change and visual disturbance.  Ears/Nose/Throat/Neck: Denied ear, nose, throat or neck symptoms, hearing loss, nasal discharge, sinus congestion and sore throat.  Cardiovascular: Denied cardiovascular symptoms, arrhythmia, chest pain/pressure, edema, exercise intolerance, orthopnea and palpitations.  Respiratory: Denied pulmonary symptoms, asthma, pleuritic pain, productive sputum, cough, dyspnea and wheezing.  Gastrointestinal: Denied, gastro-esophageal reflux, melena, nausea and vomiting.  Genitourinary: Denied genitourinary symptoms including symptomatic vaginal discharge, pelvic relaxation issues, and urinary complaints.  Musculoskeletal: Denied musculoskeletal symptoms, stiffness, swelling, muscle weakness and myalgia.  Dermatologic: Denied dermatology symptoms, rash and scar.  Neurologic: Denied neurology symptoms, dizziness, headache, neck pain and syncope.  Psychiatric: Denied psychiatric symptoms, anxiety and depression.  Endocrine: Denied endocrine symptoms including hot flashes and night sweats.   Meds:   Current Outpatient Prescriptions on File Prior to Visit  Medication Sig Dispense  Refill  . albuterol (VENTOLIN HFA) 108 (90 Base) MCG/ACT inhaler Inhale 2 puffs into the lungs every 6 (six) hours as needed. 1 Inhaler 5  . aspirin 81 MG tablet Take 81 mg by mouth daily.    . BD INSULIN SYRINGE ULTRAFINE 31G X 15/64" 1 ML MISC   0  . BREO ELLIPTA 100-25 MCG/INH AEPB Inhale 1 puff into the lungs daily.   0  . DUREZOL 0.05 % EMUL instill 1 drop into left eye twice a day  0  . escitalopram (LEXAPRO) 10 MG tablet Take 1 tablet (10 mg total) by mouth daily. 30 tablet 1  . folic acid (FOLVITE) 1 MG tablet Take 1 mg by mouth daily.    . furosemide (LASIX) 20 MG tablet take 1 tablet by mouth once daily 30 tablet 4  . gabapentin (NEURONTIN) 300 MG capsule take 1 capsule by mouth four times a day (Patient taking differently: take 1 capsule by mouth three times a day) 120 capsule 2  . HYDROcodone-acetaminophen (NORCO/VICODIN) 5-325 MG tablet take 1 to 2 tablets by mouth every 6 to 8 hours if needed for pain  0  . lamoTRIgine (LAMICTAL) 100 MG tablet Take 1 tablet (100 mg total) by mouth 2 (two) times daily. 60 tablet 2  .  LORazepam (ATIVAN) 0.5 MG tablet Take 1 tablet (0.5 mg total) by mouth at bedtime. (Patient taking differently: Take 0.25 mg by mouth at bedtime. ) 30 tablet 1  . lovastatin (MEVACOR) 20 MG tablet Take 1 tablet (20 mg total) by mouth at bedtime. 90 tablet 3  . methotrexate 50 MG/2ML injection Inject 250 mg into the skin once a week. Inject 10 ml once weekly. Patient takes on Monday.    . montelukast (SINGULAIR) 10 MG tablet take 1 tablet by mouth once daily 90 tablet 1  . multivitamin-lutein (OCUVITE-LUTEIN) CAPS capsule Take 1 capsule by mouth 2 (two) times daily.    . nebivolol (BYSTOLIC) 10 MG tablet Take 1 tablet (10 mg total) by mouth daily. (Patient taking differently: Take 5 mg by mouth daily. ) 30 tablet 3  . pantoprazole (PROTONIX) 40 MG tablet take 1 tablet by mouth twice a day as directed 180 tablet 2  . potassium chloride (K-DUR) 10 MEQ tablet take 1 tablet by  mouth once daily 30 tablet 4  . QUEtiapine (SEROQUEL) 25 MG tablet Take 1 tablet (25 mg total) by mouth at bedtime. 30 tablet 1  . RA NASAL ALLERGY 55 MCG/ACT AERO nasal inhaler instill 2 sprays into each nostril once daily  0  . ranitidine (ZANTAC) 150 MG capsule take 1 capsule by mouth twice a day 60 capsule 5   No current facility-administered medications on file prior to visit.     Objective:     Vitals:   04/23/16 1319  BP: 119/73  Pulse: 73              Endometrial biopsy results reviewed directly with the patient.  Assessment:    [id number] Patient Active Problem List   Diagnosis Date Noted  . LLQ pain 02/29/2016  . Mixed bipolar I disorder ([place]) 10/09/2015  . CKD (chronic kidney disease), stage III   . Essential hypertension   . Anemia 08/15/2015  . Conversion disorder 08/04/2015  . Anxiety and depression 07/17/2015  . Hyperlipidemia 07/17/2015  . 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 Macular degeneration 07/17/2015  . Insomnia 07/17/2015  . Rheumatoid arthritis ([place]) 12/05/2014  . Valvular heart disease 10/13/2011  . COPD (chronic obstructive pulmonary disease) ([place]) 09/09/2011  . OSA (obstructive sleep apnea) 09/09/2011  . Nocturnal hypoxemia 09/09/2011  . Gastroesophageal reflux disease 02/24/2011  . Dyslipidemia 02/24/2011  . Nonrheumatic aortic valve insufficiency 02/24/2011  . Non-rheumatic mitral regurgitation 02/24/2011     1. Postmenopausal bleeding     No evidence of hyperplasia or malignancy. She did have cervical stenosis which was resolved with her endometrial biopsy at least temporarily. I also expect that her fluid collection is now resolved as her cervix was open for the biopsy.   Plan:            1.  Routine medical care. No further worries regarding endometrial cancer.     F/U  Return for Pt to contact us if symptoms worsen. I spent 15 minutes with this patient of which greater than 50% was spent discussing  her previous visit, her emergency department visit, postmenopausal bleeding, cervical stenosis, future follow-up.  Finis Bud, M.D. 04/23/2016 1:57 PM

## 2016-04-28 ENCOUNTER — Encounter: Payer: Self-pay | Admitting: Gastroenterology

## 2016-04-28 ENCOUNTER — Ambulatory Visit (INDEPENDENT_AMBULATORY_CARE_PROVIDER_SITE_OTHER): Payer: Medicare Other | Admitting: Gastroenterology

## 2016-04-28 VITALS — BP 117/60 | HR 70 | Temp 97.9°F | Ht 64.0 in | Wt 167.4 lb

## 2016-04-28 DIAGNOSIS — R1013 Epigastric pain: Secondary | ICD-10-CM | POA: Diagnosis not present

## 2016-04-28 DIAGNOSIS — K921 Melena: Secondary | ICD-10-CM | POA: Diagnosis not present

## 2016-04-28 MED ORDER — SUCRALFATE 1 GM/10ML PO SUSP: 1.0000 g | Freq: Four times a day (QID) | ORAL | 1 refills | Status: DC

## 2016-04-28 NOTE — Progress Notes (Signed)
Primary Care Physician: Coral Spikes, DO  Primary Gastroenterologist:  Dr. Jonathon Bellows   Chief Complaint  Patient presents with  . Follow-up    HPI: Kerri Carter is a 76 y.o. female here for follow up .    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 She came in to see me for dysphagia and crcs, abdominal pain, gas and bloating  .   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 She has visited the ER 02/25/16 for abdominal pain. CT abdomen pelvis was negative but showed an adnexal lesion which was recommended to undergo a pelvic USG. LFT's in 02/2014 showed an elevated alkaline phosphatase , normal lipase and Hb 11 grams . Iron studies normal .She has a had a heel fracture recently.   Interval history 03/2016-04/2016   She is due for a colonoscopy . Last 5 years back and she had polyps. She is due for a repeat and could not have it done till April with Dr Tiffany Kocher and wanted it sooner.   She has had on and off swallowing issues for solids and liquids, recalls being "stretched in the past "  EGD/colonoscopy 04/2016   EGD - mild chronic duodenitis on bx , normal villi. Gastric biopsies showed erosive gastritis , normal esophageal biopsies.   Random colon biopsies showed mild active colitis., 3 tubular adenomas were excised.   Celiac serology was negative , TSH normal   She is taking proronix and zantac BID, presently no GI symptoms. Bloating better after stopping artificial sugars and gum. Hb 12.3 , 2 weeks back     Current Outpatient Prescriptions  Medication Sig Dispense Refill  . albuterol (VENTOLIN HFA) 108 (90 Base) MCG/ACT inhaler Inhale 2 puffs into the lungs every 6 (six) hours as needed. 1 Inhaler 5  . amoxicillin-clavulanate (AUGMENTIN) 875-125 MG tablet Take 1 tablet by mouth 2 (two) times daily. for 10 days  0  . aspirin 81 MG tablet Take 81 mg by mouth  daily.    . B-D INS SYR ULTRAFINE 1CC/30G 30G X 1/2" 1 ML MISC   0  . BD INSULIN SYRINGE ULTRAFINE 31G X 15/64" 1 ML MISC   0  . BREO ELLIPTA 100-25 MCG/INH AEPB Inhale 1 puff into the lungs daily.   0  . dicyclomine (BENTYL) 20 MG tablet take 1 tablet by mouth three times a day if needed for STOMACH SPASM  0  . DUREZOL 0.05 % EMUL instill 1 drop into left eye twice a day  0  . escitalopram (LEXAPRO) 10 MG tablet Take 1 tablet (10 mg total) by mouth daily. 30 tablet 1  . folic acid (FOLVITE) 1 MG tablet Take 1 mg by mouth daily.    . furosemide (LASIX) 20 MG tablet take 1 tablet by mouth once daily 30 tablet 4  . gabapentin (NEURONTIN) 300 MG capsule take 1 capsule by mouth four times a day (Patient taking differently: take 1 capsule by mouth three times a day) 120 capsule 2  . HYDROcodone-acetaminophen (NORCO/VICODIN) 5-325 MG tablet take 1 to 2 tablets by mouth every 6 to 8 hours if needed for pain  0  . lamoTRIgine (LAMICTAL) 100 MG tablet Take 1 tablet (100 mg total) by mouth 2 (two) times daily. 60 tablet 2  . LORazepam (ATIVAN) 0.5 MG tablet Take 1 tablet (0.5 mg total) by mouth at  bedtime. (Patient taking differently: Take 0.25 mg by mouth at bedtime. ) 30 tablet 1  . lovastatin (MEVACOR) 20 MG tablet Take 1 tablet (20 mg total) by mouth at bedtime. 90 tablet 3  . methotrexate 50 MG/2ML injection Inject 250 mg into the skin once a week. Inject 10 ml once weekly. Patient takes on Monday.    . montelukast (SINGULAIR) 10 MG tablet take 1 tablet by mouth once daily 90 tablet 1  . multivitamin-lutein (OCUVITE-LUTEIN) CAPS capsule Take 1 capsule by mouth 2 (two) times daily.    . nebivolol (BYSTOLIC) 10 MG tablet Take 1 tablet (10 mg total) by mouth daily. (Patient taking differently: Take 5 mg by mouth daily. ) 30 tablet 3  . pantoprazole (PROTONIX) 40 MG tablet take 1 tablet by mouth twice a day as directed 180 tablet 2  . potassium chloride (K-DUR) 10 MEQ tablet take 1 tablet by mouth once  daily 30 tablet 4  . QUEtiapine (SEROQUEL) 25 MG tablet Take 1 tablet (25 mg total) by mouth at bedtime. 30 tablet 1  . RA NASAL ALLERGY 55 MCG/ACT AERO nasal inhaler instill 2 sprays into each nostril once daily  0  . ranitidine (ZANTAC) 150 MG capsule take 1 capsule by mouth twice a day 60 capsule 5   No current facility-administered medications for this visit.     Allergies as of 04/28/2016 - Review Complete 04/28/2016  Allergen Reaction Noted  . Ceftin [cefuroxime axetil] Anaphylaxis 09/08/2011  . Lisinopril Anaphylaxis 09/08/2011  . Antihistamines, chlorpheniramine-type  09/08/2011  . Antivert [meclizine hcl]  09/08/2011  . Decongestant [pseudoephedrine hcl]  09/08/2011  . Doxycycline  09/08/2011  . Morphine and related Other (See Comments) 06/21/2015  . Polymyxin b  02/28/2016  . Sulfa antibiotics  04/25/2014  . Xarelto [rivaroxaban]  01/19/2012  . Adhesive [tape] Rash 09/08/2011  . Iodine Hives and Rash 09/08/2011  . Levaquin [levofloxacin in d5w] Rash 09/08/2011  . Tetanus toxoids  09/08/2011    ROS:  General: Negative for anorexia, weight loss, fever, chills, fatigue, weakness. ENT: Negative for hoarseness, difficulty swallowing , nasal congestion. CV: Negative for chest pain, angina, palpitations, dyspnea on exertion, peripheral edema.  Respiratory: Negative for dyspnea at rest, dyspnea on exertion, cough, sputum, wheezing.  GI: See history of present illness. GU:  Negative for dysuria, hematuria, urinary incontinence, urinary frequency, nocturnal urination.  Endo: Negative for unusual weight change.    Physical Examination:   BP 117/60   Pulse 70   Temp 97.9 F (36.6 C) (Oral)   Ht 5\' 4"  (1.626 m)   Wt 167 lb 6.4 oz (75.9 kg)   BMI 28.73 kg/m   General: Well-nourished, well-developed in no acute distress.  Eyes: No icterus. Conjunctivae pink. Mouth: Oropharyngeal mucosa moist and pink , no lesions erythema or exudate. Lungs: Clear to auscultation  bilaterally. Non-labored. Heart: Regular rate and rhythm, no murmurs rubs or gallops.  Abdomen: Bowel sounds are normal, nontender, nondistended, no hepatosplenomegaly or masses, no abdominal bruits or hernia , no rebound or guarding.   Extremities: No lower extremity edema. No clubbing or deformities. Neuro: Alert and oriented x 3.  Grossly intact. Skin: Warm and dry, no jaundice.   Psych: Alert and cooperative, normal mood and affect.  Imaging Studies: US Transvaginal Non-ob  Result Date: 04/01/2016 CLINICAL DATA:  Left adnexal mass on CT at [facility] hospital. EXAM: TRANSABDOMINAL AND TRANSVAGINAL ULTRASOUND OF PELVIS TECHNIQUE: Both transabdominal and transvaginal ultrasound examinations of the pelvis were performed. Transabdominal technique was performed  for global imaging of the pelvis including uterus, ovaries, adnexal regions, and pelvic cul-de-sac. It was necessary to proceed with endovaginal exam following the transabdominal exam to visualize the adnexa. COMPARISON:  CT of the abdomen and pelvis 02/25/2016. FINDINGS: Uterus Measurements: 4.0 x 1.5 x 2.3 cm. No fibroids or other mass visualized. Endometrium Thickness: 2.8 mm.  Fluid is present within the endometrial canal. Right ovary Not visualized Left ovary Not visualized Other findings No abnormal free fluid. IMPRESSION: 1. No adnexal mass identified. 2. Atrophic uterus with fluid in the endometrial canal. No obstructing lesion is present. Recommend GYN consultation. Electronically Signed   By: San Morelle M.D.   On: 04/01/2016 15:56   US Pelvis Complete  Result Date: 04/01/2016 CLINICAL DATA:  Left adnexal mass on CT at [facility] hospital. EXAM: TRANSABDOMINAL AND TRANSVAGINAL ULTRASOUND OF PELVIS TECHNIQUE: Both transabdominal and transvaginal ultrasound examinations of the pelvis were performed. Transabdominal technique was performed for global imaging of the pelvis including uterus, ovaries, adnexal regions, and pelvic cul-de-sac. It  was necessary to proceed with endovaginal exam following the transabdominal exam to visualize the adnexa. COMPARISON:  CT of the abdomen and pelvis 02/25/2016. FINDINGS: Uterus Measurements: 4.0 x 1.5 x 2.3 cm. No fibroids or other mass visualized. Endometrium Thickness: 2.8 mm.  Fluid is present within the endometrial canal. Right ovary Not visualized Left ovary Not visualized Other findings No abnormal free fluid. IMPRESSION: 1. No adnexal mass identified. 2. Atrophic uterus with fluid in the endometrial canal. No obstructing lesion is present. Recommend GYN consultation. Electronically Signed   By: San Morelle M.D.   On: 04/01/2016 15:56    Assessment and Plan:    Kerri Carter is a 76 y.o. y/o female here to follow up for lower abdominal pain, gas and bloating which may be due to carbohydrate malabsorption or from artificial sugars in crystallite and chewing gum . She also has some history of melena and prior history of small bowel , duodenal AVM,s .   1. Gas/bloating/abdominal pain - resolved   2.Melena- EGD/Colonoscopy were normal, H/o small bowel AVMS's, presently not anemic, recheck Hb in 2-3 months and if drop seen then will go ahead with capsule study   3.Lower abdominal pain likely due to gas-resolved   Dr Jonathon Bellows  MD Follow up in 4 months.

## 2016-05-07 ENCOUNTER — Encounter: Payer: Self-pay | Admitting: Emergency Medicine

## 2016-05-07 ENCOUNTER — Inpatient Hospital Stay
Admission: EM | Admit: 2016-05-07 | Discharge: 2016-05-14 | DRG: 418 | Disposition: A | Discharge status: Skilled Nursing Facility | Payer: Medicare Other | Attending: Internal Medicine | Admitting: Internal Medicine

## 2016-05-07 ENCOUNTER — Emergency Department: Payer: Medicare Other

## 2016-05-07 ENCOUNTER — Inpatient Hospital Stay: Payer: Medicare Other

## 2016-05-07 DIAGNOSIS — K8051 Calculus of bile duct without cholangitis or cholecystitis with obstruction: Secondary | ICD-10-CM | POA: Diagnosis not present

## 2016-05-07 DIAGNOSIS — I5032 Chronic diastolic (congestive) heart failure: Secondary | ICD-10-CM | POA: Diagnosis present

## 2016-05-07 DIAGNOSIS — F419 Anxiety disorder, unspecified: Secondary | ICD-10-CM | POA: Diagnosis present

## 2016-05-07 DIAGNOSIS — Z833 Family history of diabetes mellitus: Secondary | ICD-10-CM

## 2016-05-07 DIAGNOSIS — Z8249 Family history of ischemic heart disease and other diseases of the circulatory system: Secondary | ICD-10-CM

## 2016-05-07 DIAGNOSIS — Z9109 Other allergy status, other than to drugs and biological substances: Secondary | ICD-10-CM

## 2016-05-07 DIAGNOSIS — E1142 Type 2 diabetes mellitus with diabetic polyneuropathy: Secondary | ICD-10-CM | POA: Diagnosis present

## 2016-05-07 DIAGNOSIS — I2510 Atherosclerotic heart disease of native coronary artery without angina pectoris: Secondary | ICD-10-CM | POA: Diagnosis present

## 2016-05-07 DIAGNOSIS — Z888 Allergy status to other drugs, medicaments and biological substances status: Secondary | ICD-10-CM

## 2016-05-07 DIAGNOSIS — K851 Biliary acute pancreatitis without necrosis or infection: Principal | ICD-10-CM | POA: Diagnosis present

## 2016-05-07 DIAGNOSIS — Z905 Acquired absence of kidney: Secondary | ICD-10-CM

## 2016-05-07 DIAGNOSIS — Z96649 Presence of unspecified artificial hip joint: Secondary | ICD-10-CM | POA: Diagnosis present

## 2016-05-07 DIAGNOSIS — I130 Hypertensive heart and chronic kidney disease with heart failure and stage 1 through stage 4 chronic kidney disease, or unspecified chronic kidney disease: Secondary | ICD-10-CM | POA: Diagnosis present

## 2016-05-07 DIAGNOSIS — Z8619 Personal history of other infectious and parasitic diseases: Secondary | ICD-10-CM

## 2016-05-07 DIAGNOSIS — R1011 Right upper quadrant pain: Secondary | ICD-10-CM | POA: Diagnosis present

## 2016-05-07 DIAGNOSIS — Z87891 Personal history of nicotine dependence: Secondary | ICD-10-CM

## 2016-05-07 DIAGNOSIS — J449 Chronic obstructive pulmonary disease, unspecified: Secondary | ICD-10-CM | POA: Diagnosis present

## 2016-05-07 DIAGNOSIS — E1122 Type 2 diabetes mellitus with diabetic chronic kidney disease: Secondary | ICD-10-CM | POA: Diagnosis present

## 2016-05-07 DIAGNOSIS — M069 Rheumatoid arthritis, unspecified: Secondary | ICD-10-CM | POA: Diagnosis present

## 2016-05-07 DIAGNOSIS — K8020 Calculus of gallbladder without cholecystitis without obstruction: Secondary | ICD-10-CM

## 2016-05-07 DIAGNOSIS — Z8711 Personal history of peptic ulcer disease: Secondary | ICD-10-CM | POA: Diagnosis not present

## 2016-05-07 DIAGNOSIS — R0602 Shortness of breath: Secondary | ICD-10-CM

## 2016-05-07 DIAGNOSIS — R1114 Bilious vomiting: Secondary | ICD-10-CM | POA: Diagnosis not present

## 2016-05-07 DIAGNOSIS — E785 Hyperlipidemia, unspecified: Secondary | ICD-10-CM | POA: Diagnosis present

## 2016-05-07 DIAGNOSIS — R748 Abnormal levels of other serum enzymes: Secondary | ICD-10-CM | POA: Diagnosis present

## 2016-05-07 DIAGNOSIS — K5900 Constipation, unspecified: Secondary | ICD-10-CM | POA: Diagnosis present

## 2016-05-07 DIAGNOSIS — Z887 Allergy status to serum and vaccine status: Secondary | ICD-10-CM

## 2016-05-07 DIAGNOSIS — H9202 Otalgia, left ear: Secondary | ICD-10-CM | POA: Diagnosis not present

## 2016-05-07 DIAGNOSIS — Z882 Allergy status to sulfonamides status: Secondary | ICD-10-CM

## 2016-05-07 DIAGNOSIS — K8065 Calculus of gallbladder and bile duct with chronic cholecystitis with obstruction: Secondary | ICD-10-CM | POA: Diagnosis present

## 2016-05-07 DIAGNOSIS — Z885 Allergy status to narcotic agent status: Secondary | ICD-10-CM

## 2016-05-07 DIAGNOSIS — Z79899 Other long term (current) drug therapy: Secondary | ICD-10-CM

## 2016-05-07 DIAGNOSIS — Z881 Allergy status to other antibiotic agents status: Secondary | ICD-10-CM

## 2016-05-07 DIAGNOSIS — K8071 Calculus of gallbladder and bile duct without cholecystitis with obstruction: Secondary | ICD-10-CM | POA: Diagnosis not present

## 2016-05-07 DIAGNOSIS — M1990 Unspecified osteoarthritis, unspecified site: Secondary | ICD-10-CM | POA: Diagnosis present

## 2016-05-07 DIAGNOSIS — N183 Chronic kidney disease, stage 3 (moderate): Secondary | ICD-10-CM | POA: Diagnosis present

## 2016-05-07 DIAGNOSIS — F449 Dissociative and conversion disorder, unspecified: Secondary | ICD-10-CM | POA: Diagnosis present

## 2016-05-07 DIAGNOSIS — R1110 Vomiting, unspecified: Secondary | ICD-10-CM

## 2016-05-07 DIAGNOSIS — Z820 Family history of epilepsy and other diseases of the nervous system: Secondary | ICD-10-CM

## 2016-05-07 DIAGNOSIS — F329 Major depressive disorder, single episode, unspecified: Secondary | ICD-10-CM | POA: Diagnosis present

## 2016-05-07 DIAGNOSIS — Z8261 Family history of arthritis: Secondary | ICD-10-CM

## 2016-05-07 DIAGNOSIS — K219 Gastro-esophageal reflux disease without esophagitis: Secondary | ICD-10-CM | POA: Diagnosis present

## 2016-05-07 DIAGNOSIS — Z91041 Radiographic dye allergy status: Secondary | ICD-10-CM

## 2016-05-07 DIAGNOSIS — Z818 Family history of other mental and behavioral disorders: Secondary | ICD-10-CM

## 2016-05-07 LAB — COMPREHENSIVE METABOLIC PANEL
ALT: 82 U/L — ABNORMAL HIGH (ref 14–54)
AST: 96 U/L — ABNORMAL HIGH (ref 15–41)
Albumin: 3.5 g/dL (ref 3.5–5.0)
Alkaline Phosphatase: 470 U/L — ABNORMAL HIGH (ref 38–126)
Anion gap: 7 (ref 5–15)
BUN: 22 mg/dL — ABNORMAL HIGH (ref 6–20)
CO2: 27 mmol/L (ref 22–32)
Calcium: 8.6 mg/dL — ABNORMAL LOW (ref 8.9–10.3)
Chloride: 103 mmol/L (ref 101–111)
Creatinine, Ser: 1.42 mg/dL — ABNORMAL HIGH (ref 0.44–1.00)
GFR calc Af Amer: 41 mL/min — ABNORMAL LOW (ref >60)
GFR calc non Af Amer: 36 mL/min — ABNORMAL LOW (ref >60)
Glucose, Bld: 189 mg/dL — ABNORMAL HIGH (ref 65–99)
Potassium: 3.8 mmol/L (ref 3.5–5.1)
Sodium: 137 mmol/L (ref 135–145)
Total Bilirubin: 3.2 mg/dL — ABNORMAL HIGH (ref 0.3–1.2)
Total Protein: 7.1 g/dL (ref 6.5–8.1)

## 2016-05-07 LAB — URINALYSIS, COMPLETE (UACMP) WITH MICROSCOPIC
Glucose, UA: NEGATIVE mg/dL
Hgb urine dipstick: NEGATIVE
Ketones, ur: NEGATIVE mg/dL
Nitrite: NEGATIVE
Protein, ur: 30 mg/dL — AB
Specific Gravity, Urine: 1.024 (ref 1.005–1.030)
pH: 6 (ref 5.0–8.0)

## 2016-05-07 LAB — GLUCOSE, CAPILLARY
Glucose-Capillary: 126 mg/dL — ABNORMAL HIGH (ref 65–99)
Glucose-Capillary: 101 mg/dL — ABNORMAL HIGH (ref 65–99)
Glucose-Capillary: 88 mg/dL (ref 65–99)

## 2016-05-07 LAB — CBC
HCT: 33.3 % — ABNORMAL LOW (ref 35.0–47.0)
Hemoglobin: 11.2 g/dL — ABNORMAL LOW (ref 12.0–16.0)
MCH: 30.1 pg (ref 26.0–34.0)
MCHC: 33.6 g/dL (ref 32.0–36.0)
MCV: 89.6 fL (ref 80.0–100.0)
Platelets: 238 10*3/uL (ref 150–440)
RBC: 3.71 MIL/uL — ABNORMAL LOW (ref 3.80–5.20)
RDW: 18.7 % — ABNORMAL HIGH (ref 11.5–14.5)
WBC: 11.7 10*3/uL — ABNORMAL HIGH (ref 3.6–11.0)

## 2016-05-07 LAB — TROPONIN I: Troponin I: <0.03 ng/mL (ref <0.03)

## 2016-05-07 LAB — TSH: TSH: 3.441 u[IU]/mL (ref 0.350–4.500)

## 2016-05-07 LAB — LIPASE, BLOOD: Lipase: 63 U/L — ABNORMAL HIGH (ref 11–51)

## 2016-05-07 MED ORDER — HYDROMORPHONE HCL 1 MG/ML IJ SOLN
INTRAMUSCULAR | Status: AC
  Administered 2016-05-07: 0.5 mg via INTRAVENOUS
  Filled 2016-05-07: qty 1

## 2016-05-07 MED ORDER — INSULIN ASPART 100 UNIT/ML ~~LOC~~ SOLN: 0.0000 [IU] | Freq: Every day | SUBCUTANEOUS | Status: DC

## 2016-05-07 MED ORDER — ACETAMINOPHEN 325 MG PO TABS
650.0000 mg | ORAL_TABLET | Freq: Four times a day (QID) | ORAL | Status: DC | PRN
  Administered 2016-05-07: 650 mg via ORAL
  Filled 2016-05-07: qty 2

## 2016-05-07 MED ORDER — DOCUSATE SODIUM 100 MG PO CAPS
100.0000 mg | ORAL_CAPSULE | Freq: Two times a day (BID) | ORAL | Status: DC
  Administered 2016-05-08 – 2016-05-14 (×8): 100 mg via ORAL
  Filled 2016-05-07 (×12): qty 1

## 2016-05-07 MED ORDER — MORPHINE SULFATE (PF) 2 MG/ML IV SOLN
1.0000 mg | INTRAVENOUS | Status: DC | PRN
  Administered 2016-05-10: 2 mg via INTRAVENOUS
  Administered 2016-05-11: 1 mg via INTRAVENOUS
  Filled 2016-05-07 (×2): qty 1

## 2016-05-07 MED ORDER — LAMOTRIGINE 100 MG PO TABS
100.0000 mg | ORAL_TABLET | Freq: Two times a day (BID) | ORAL | Status: DC
  Administered 2016-05-08 – 2016-05-14 (×13): 100 mg via ORAL
  Filled 2016-05-07 (×3): qty 4
  Filled 2016-05-07 (×5): qty 1
  Filled 2016-05-07 (×2): qty 4
  Filled 2016-05-07 (×3): qty 1
  Filled 2016-05-07 (×2): qty 4

## 2016-05-07 MED ORDER — SODIUM CHLORIDE 0.9 % IV SOLN
INTRAVENOUS | Status: DC
  Administered 2016-05-07 – 2016-05-11 (×7): via INTRAVENOUS

## 2016-05-07 MED ORDER — ONDANSETRON HCL 4 MG/2ML IJ SOLN
INTRAMUSCULAR | Status: AC
  Administered 2016-05-07: 4 mg via INTRAVENOUS
  Filled 2016-05-07: qty 2

## 2016-05-07 MED ORDER — FLUTICASONE FUROATE-VILANTEROL 100-25 MCG/INH IN AEPB
1.0000 | INHALATION_SPRAY | Freq: Every day | RESPIRATORY_TRACT | Status: DC
  Administered 2016-05-11 – 2016-05-14 (×2): 1 via RESPIRATORY_TRACT
  Filled 2016-05-07: qty 28

## 2016-05-07 MED ORDER — ACETAMINOPHEN 650 MG RE SUPP: 650.0000 mg | Freq: Four times a day (QID) | RECTAL | Status: DC | PRN

## 2016-05-07 MED ORDER — FUROSEMIDE 20 MG PO TABS
20.0000 mg | ORAL_TABLET | Freq: Every day | ORAL | Status: DC
  Administered 2016-05-08: 20 mg via ORAL
  Filled 2016-05-07: qty 1

## 2016-05-07 MED ORDER — PANTOPRAZOLE SODIUM 40 MG PO TBEC
40.0000 mg | DELAYED_RELEASE_TABLET | Freq: Two times a day (BID) | ORAL | Status: DC
  Administered 2016-05-07 – 2016-05-14 (×15): 40 mg via ORAL
  Filled 2016-05-07 (×15): qty 1

## 2016-05-07 MED ORDER — ONDANSETRON HCL 4 MG/2ML IJ SOLN
4.0000 mg | Freq: Once | INTRAMUSCULAR | Status: AC
  Administered 2016-05-07: 4 mg via INTRAVENOUS

## 2016-05-07 MED ORDER — ESCITALOPRAM OXALATE 10 MG PO TABS
10.0000 mg | ORAL_TABLET | Freq: Every day | ORAL | Status: DC
  Administered 2016-05-08 – 2016-05-14 (×7): 10 mg via ORAL
  Filled 2016-05-07 (×7): qty 1

## 2016-05-07 MED ORDER — ONDANSETRON HCL 4 MG/2ML IJ SOLN: 4.0000 mg | Freq: Four times a day (QID) | INTRAMUSCULAR | Status: DC | PRN

## 2016-05-07 MED ORDER — NEBIVOLOL HCL 5 MG PO TABS
5.0000 mg | ORAL_TABLET | Freq: Every day | ORAL | Status: DC
  Administered 2016-05-07 – 2016-05-14 (×6): 5 mg via ORAL
  Filled 2016-05-07 (×7): qty 1

## 2016-05-07 MED ORDER — MORPHINE SULFATE (PF) 2 MG/ML IV SOLN: 2.0000 mg | INTRAVENOUS | Status: DC | PRN

## 2016-05-07 MED ORDER — GADOBENATE DIMEGLUMINE 529 MG/ML IV SOLN
10.0000 mL | Freq: Once | INTRAVENOUS | Status: AC | PRN
  Administered 2016-05-07: 7 mL via INTRAVENOUS

## 2016-05-07 MED ORDER — INSULIN ASPART 100 UNIT/ML ~~LOC~~ SOLN: 0.0000 [IU] | Freq: Three times a day (TID) | SUBCUTANEOUS | Status: DC

## 2016-05-07 MED ORDER — HYDROMORPHONE HCL 1 MG/ML IJ SOLN
0.5000 mg | Freq: Once | INTRAMUSCULAR | Status: AC
  Administered 2016-05-07: 0.5 mg via INTRAVENOUS

## 2016-05-07 MED ORDER — FOLIC ACID 1 MG PO TABS
1.0000 mg | ORAL_TABLET | Freq: Every day | ORAL | Status: DC
  Administered 2016-05-08 – 2016-05-14 (×7): 1 mg via ORAL
  Filled 2016-05-07 (×7): qty 1

## 2016-05-07 MED ORDER — PIPERACILLIN-TAZOBACTAM 3.375 G IVPB
3.3750 g | Freq: Three times a day (TID) | INTRAVENOUS | Status: DC
  Administered 2016-05-07: 3.375 g via INTRAVENOUS
  Filled 2016-05-07 (×2): qty 50

## 2016-05-07 MED ORDER — ONDANSETRON HCL 4 MG PO TABS: 4.0000 mg | ORAL_TABLET | Freq: Four times a day (QID) | ORAL | Status: DC | PRN

## 2016-05-07 NOTE — H&P (Signed)
Kerri Carter is an 76 y.o. female.   Chief Complaint: Abdominal pain HPI: The patient with past medical history of rheumatoid arthritis and peptic ulcer disease as well as chronic kidney disease presents emergency department complaining of abdominal pain. The patient states that she had an episode of acute right upper quadrant pain followed by nausea and vomiting this evening. Emesis was nonbloody but she cannot recall if it was bilious. She had a similar episode 2 days ago and recalls an episode similar again a week before that. Tonight the pain did not resolve after vomiting as it had on the other occasions. She admits to subjective fever as well as rigors. In the emergency department ultrasound of the abdomen showed gallstones and dilated common bile duct. The patient's liver enzymes were elevated as well as lipase which prompted emergency department staff to call the hospitalist service for admission.  Past Medical History:  Diagnosis Date  . Anxiety   . Chicken pox   . CKD (chronic kidney disease), stage III    a. s/p R nephrectomy.  . Conversion disorder   . COPD (chronic obstructive pulmonary disease) ([place])   . Depression   . Diabetes mellitus without complication ([place])    Patient denies Diabetes, does not take any diabetic medications  . Essential hypertension   . GERD (gastroesophageal reflux disease)   . Hyperlipidemia   . Inflammatory arthritis    a. hands/carpal tunnel.  b. Low titer rheumatoid factor. c. Negative anti-CCP antibodies. d. Plaquenil.  . Non-Obstructive CAD    a. 07/2009 Cath ([facility]): nonobs dzs;  b. 03/2011 Cath [facility]): nonobs dzs.  . Osteoarthritis    a. Knees.  . PUD (peptic ulcer disease)   . Toxic maculopathy   . Valvular heart disease    a. 07/2015 Echo: EF 55-60%, Mild AI, AS, MR, and TR.    Past Surgical History:  Procedure Laterality Date  . APPENDECTOMY    . BUNIONECTOMY    . CESAREAN SECTION     x1  . COLONOSCOPY WITH PROPOFOL N/A 04/02/2016    Procedure: COLONOSCOPY WITH PROPOFOL;  Surgeon: Jonathon Bellows, MD;  Location: [facility] ENDOSCOPY;  Service: Endoscopy;  Laterality: N/A;  . ESOPHAGEAL DILATION  04/02/2016   Procedure: ESOPHAGEAL DILATION;  Surgeon: Jonathon Bellows, MD;  Location: [facility] ENDOSCOPY;  Service: Endoscopy;;  . ESOPHAGOGASTRODUODENOSCOPY (EGD) WITH PROPOFOL N/A 04/02/2016   Procedure: ESOPHAGOGASTRODUODENOSCOPY (EGD) WITH PROPOFOL;  Surgeon: Jonathon Bellows, MD;  Location: [facility] ENDOSCOPY;  Service: Endoscopy;  Laterality: N/A;  . 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 TONSILLECTOMY    . TOTAL HIP ARTHROPLASTY  12/10/11   [facility] left hip  . TUBAL LIGATION      Family History  Problem Relation Age of Onset  . Rheum arthritis Mother   . Asthma Mother   . Parkinson's disease Mother   . Heart disease Mother   . Stroke Mother   . Hypertension Mother   . Heart attack Father   . Heart disease Father   . Hypertension Father   . Diabetes Son   . Asthma Sister   . Heart disease Sister   . Lung cancer Sister   . Heart disease Sister   . Heart disease Sister   . Breast cancer Sister   . Heart attack Sister   . Heart disease Brother   . Heart disease Maternal Grandmother   . Diabetes Maternal Grandmother   . Colon cancer Maternal Grandmother   .  Heart disease Brother   . Alcohol abuse Brother   . Depression Brother    Social History:  reports that she quit smoking about 42 years ago. Her smoking use included Cigarettes. She has a 10.00 pack-year smoking history. She has never used smokeless tobacco. She reports that she does not drink alcohol or use drugs.  Allergies:  Allergies  Allergen Reactions  . Ceftin [Cefuroxime Axetil] Anaphylaxis    Other reaction(s): Other (See Comments) REACTION: tongue and throat swell Other Reaction: TONGUE AND THROAT SWELLING REACTION: tongue and throat swell  . Lisinopril Anaphylaxis    REACTION: tongue and throat swelling (onset 10-10-09)  .  Antihistamines, Chlorpheniramine-Type     REACTION: makes pt hyper  . Antivert [Meclizine Hcl]     Other reaction(s): Unknown REACTION: bladder will not empty REACTION: bladder will not empty  . Decongestant [Pseudoephedrine Hcl]     Other reaction(s): Unknown REACTION: makes pt hyper REACTION: makes pt hyper  . Doxycycline     REACTION: GI upset  . Morphine And Related Other (See Comments)    Per patient, low blood pressure issues, following morphine, that requires action to raise it back up.  . Polymyxin B     Medication was in eye drops.  . Sulfa Antibiotics     Face swelling  . Xarelto [Rivaroxaban]     Other reaction(s): Other (See Comments) GI bleed Stomach burning, bleeding, and tar in stool GI bleed  . Adhesive [Tape] Rash  . Iodine Hives and Rash  . Levaquin [Levofloxacin In D5w] Rash  . Tetanus Toxoids     REACTION: rash, fever, hot to touch at injection site   Prior to Admission medications   Medication Sig Start Date End Date Taking? Authorizing Provider  albuterol (VENTOLIN HFA) 108 (90 Base) MCG/ACT inhaler Inhale 2 puffs into the lungs every 6 (six) hours as needed. 11/25/15  Yes Juanito Doom, MD  escitalopram (LEXAPRO) 10 MG tablet Take 1 tablet (10 mg total) by mouth daily. 04/22/16  Yes Rainey Pines, MD  folic acid (FOLVITE) 1 MG tablet Take 1 mg by mouth daily.   Yes Historical Provider, MD  furosemide (LASIX) 20 MG tablet take 1 tablet by mouth once daily 09/09/15  Yes Coral Spikes, DO  gabapentin (NEURONTIN) 300 MG capsule take 1 capsule by mouth four times a day Patient taking differently: take 1 capsule by mouth three times a day 02/21/16  Yes Coral Spikes, DO  HYDROcodone-acetaminophen (NORCO/VICODIN) 5-325 MG tablet take 1 to 2 tablets by mouth every 6 to 8 hours if needed for pain 01/07/16  Yes Historical Provider, MD  lamoTRIgine (LAMICTAL) 100 MG tablet Take 1 tablet (100 mg total) by mouth 2 (two) times daily. 04/22/16  Yes Rainey Pines, MD  LORazepam  (ATIVAN) 0.5 MG tablet Take 1 tablet (0.5 mg total) by mouth at bedtime. Patient taking differently: Take 0.25 mg by mouth at bedtime.  04/22/16  Yes Rainey Pines, MD  lovastatin (MEVACOR) 20 MG tablet Take 1 tablet (20 mg total) by mouth at bedtime. 10/22/15  Yes Coral Spikes, DO  methotrexate 50 MG/2ML injection Inject 250 mg into the skin once a week. Inject 10 ml once weekly. Patient takes on Monday. 09/27/14  Yes Historical Provider, MD  montelukast (SINGULAIR) 10 MG tablet take 1 tablet by mouth once daily 12/02/15  Yes Jayce G Cook, DO  multivitamin-lutein (OCUVITE-LUTEIN) CAPS capsule Take 1 capsule by mouth 2 (two) times daily.   Yes Historical Provider, MD  nebivolol (BYSTOLIC) 10 MG tablet Take 1 tablet (10 mg total) by mouth daily. Patient taking differently: Take 5 mg by mouth daily.  07/23/15  Yes Juanito Doom, MD  pantoprazole (PROTONIX) 40 MG tablet take 1 tablet by mouth twice a day as directed 04/14/16  Yes Coral Spikes, DO  potassium chloride (K-DUR) 10 MEQ tablet take 1 tablet by mouth once daily 09/09/15  Yes Jayce G Cook, DO  QUEtiapine (SEROQUEL) 25 MG tablet Take 1 tablet (25 mg total) by mouth at bedtime. 04/22/16  Yes Rainey Pines, MD  RA NASAL ALLERGY 55 MCG/ACT AERO nasal inhaler instill 2 sprays into each nostril once daily 01/22/16  Yes Historical Provider, MD  ranitidine (ZANTAC) 150 MG capsule take 1 capsule by mouth twice a day 12/03/15  Yes Jayce G Cook, DO  sucralfate (CARAFATE) 1 GM/10ML suspension Take 10 mLs (1 g total) by mouth 4 (four) times daily. 04/28/16 05/29/16 Yes Jonathon Bellows, MD  BREO ELLIPTA 100-25 MCG/INH AEPB Inhale 1 puff into the lungs daily.  02/04/16   Historical Provider, MD      Results for orders placed or performed during the hospital encounter of 05/07/16 (from the past 48 hour(s))  Lipase, blood     Status: Abnormal   Collection Time: 05/07/16  5:24 AM  Result Value Ref Range   Lipase 63 (H) 11 - 51 U/L  Comprehensive metabolic panel      Status: Abnormal   Collection Time: 05/07/16  5:24 AM  Result Value Ref Range   Sodium 137 135 - 145 mmol/L   Potassium 3.8 3.5 - 5.1 mmol/L   Chloride 103 101 - 111 mmol/L   CO2 27 22 - 32 mmol/L   Glucose, Bld 189 (H) 65 - 99 mg/dL   BUN 22 (H) 6 - 20 mg/dL   Creatinine, Ser 1.42 (H) 0.44 - 1.00 mg/dL   Calcium 8.6 (L) 8.9 - 10.3 mg/dL   Total Protein 7.1 6.5 - 8.1 g/dL   Albumin 3.5 3.5 - 5.0 g/dL   AST 96 (H) 15 - 41 U/L   ALT 82 (H) 14 - 54 U/L   Alkaline Phosphatase 470 (H) 38 - 126 U/L   Total Bilirubin 3.2 (H) 0.3 - 1.2 mg/dL   GFR calc non Af Amer 36 (L) >60 mL/min   GFR calc Af Amer 41 (L) >60 mL/min    Comment: (NOTE) The eGFR has been calculated using the CKD EPI equation. This calculation has not been validated in all clinical situations. eGFR's persistently <60 mL/min signify possible Chronic Kidney Disease.    Anion gap 7 5 - 15  CBC     Status: Abnormal   Collection Time: 05/07/16  5:24 AM  Result Value Ref Range   WBC 11.7 (H) 3.6 - 11.0 K/uL   RBC 3.71 (L) 3.80 - 5.20 MIL/uL   Hemoglobin 11.2 (L) 12.0 - 16.0 g/dL   HCT 33.3 (L) 35.0 - 47.0 %   MCV 89.6 80.0 - 100.0 fL   MCH 30.1 26.0 - 34.0 pg   MCHC 33.6 32.0 - 36.0 g/dL   RDW 18.7 (H) 11.5 - 14.5 %   Platelets 238 150 - 440 K/uL  Troponin I     Status: None   Collection Time: 05/07/16  5:24 AM  Result Value Ref Range   Troponin I <0.03 <0.03 ng/mL   US Abdomen Limited Ruq  Result Date: 05/07/2016 CLINICAL DATA:  Right upper quadrant pain and vomiting. Onset 11 hours  ago. EXAM: US ABDOMEN LIMITED - RIGHT UPPER QUADRANT COMPARISON:  CT 02/25/2016 FINDINGS: Gallbladder: Multiple calculi are present, measuring up to 11 mm. Borderline gallbladder wall thickness, 3.8 mm. No pericholecystic fluid. No tenderness over the gallbladder during the exam, but the patient had been medicated. Common bile duct: Diameter: 7 mm. Liver: Mild coarsening of hepatic parenchymal echotexture without focal lesion. IMPRESSION:  There are mild study limitations due to bowel gas. There is cholelithiasis and borderline gallbladder wall thickness. Mildly coarsened hepatic parenchymal echotexture may represent fatty infiltration, but is nonspecific. Electronically Signed   By: Andreas Newport M.D.   On: 05/07/2016 06:58    Review of Systems  Constitutional: Positive for chills and fever (subjective).  HENT: Negative for sore throat and tinnitus.   Eyes: Negative for blurred vision and redness.  Respiratory: Negative for cough and shortness of breath.   Cardiovascular: Negative for chest pain, palpitations, orthopnea and PND.  Gastrointestinal: Positive for abdominal pain, nausea and vomiting. Negative for diarrhea.  Genitourinary: Negative for dysuria, frequency and urgency.  Musculoskeletal: Negative for joint pain and myalgias.  Skin: Negative for rash.       No lesions  Neurological: Negative for speech change, focal weakness and weakness.  Endo/Heme/Allergies: Does not bruise/bleed easily.       No temperature intolerance  Psychiatric/Behavioral: Negative for depression and suicidal ideas.    Blood pressure 140/65, pulse 78, temperature 98.5 F (36.9 C), temperature source Oral, resp. rate 18, height _0  (1.626 m), weight 75.8 kg (167 lb), SpO2 98 %. Physical Exam  Vitals reviewed. Constitutional: She is oriented to person, place, and time. She appears well-developed and well-nourished. No distress.  HENT:  Head: Normocephalic and atraumatic.  Mouth/Throat: Oropharynx is clear and moist.  Eyes: Conjunctivae and EOM are normal. Pupils are equal, round, and reactive to light. No scleral icterus.  Neck: Normal range of motion. Neck supple. No JVD present. No tracheal deviation present. No thyromegaly present.  Cardiovascular: Normal rate, regular rhythm and normal heart sounds.  Exam reveals no gallop and no friction rub.   No murmur heard. Respiratory: Effort normal and breath sounds normal.  GI: Soft.  Bowel sounds are normal. She exhibits no distension and no mass. There is tenderness. There is guarding (voluntary). There is no rebound.  Genitourinary:  Genitourinary Comments: Deferred  Musculoskeletal: Normal range of motion. She exhibits edema (trace).  Lymphadenopathy:    She has no cervical adenopathy.  Neurological: She is alert and oriented to person, place, and time. No cranial nerve deficit. She exhibits normal muscle tone.  Skin: Skin is warm and dry. No rash noted. No erythema.  Psychiatric: She has a normal mood and affect. Her behavior is normal. Judgment and thought content normal.     Assessment/Plan This is a 76 year old female admitted for gallstone pancreatitis. 1. Gallstone pancreatitis: Patient does not meet objective criteria for sepsis at this time, however I have started Zosyn to cover gram negatives organisms. Antiemetics and pain medicine. Consult gastroenterology for MRCP. Patient is nothing by mouth. Await recommendations from gastroenterology regarding peptic ulcer disease. 2. Chronic kidney disease: Stage III; hydrate with intravenous fluid. Avoid nephrotoxic agents 3. Hypertension: Controlled; continue nebivolol 4. COPD: Stable; continue inhaled corticosteroid as well as albuterol. Add Spiriva 5. Rheumatoid arthritis: Continue methotrexate postoperatively 6. Lipidemia: Continue statin therapy 7. Lower extremity edema: Trace; secondary to venous insufficiency as well as mild valvular heart disease. Continue Lasix per home regimen 8. Psychiatric issues: Continue Lamictal and Seroquel 9. DVT  prophylaxis: SCDs 10. GI prophylaxis: Pantoprazole per home regimen The patient is a full code. Time spent on admission orders and patient care approximately 45 minutes  Harrie Foreman, MD 05/07/2016, 7:26 AM

## 2016-05-07 NOTE — Consult Note (Signed)
Lucilla Lame, MD [facility]  [address] [place], [place] [address] Phone: [phone] Fax : [phone]  Consultation  Referring Provider:     Dr. Posey Pronto Primary Care Physician:  Coral Spikes, DO Primary Gastroenterologist:  Dr. Vicente Males         Reason for Consultation:     Abnormal liver enzymes and abdominal pain  Date of Admission:  05/07/2016 Date of Consultation:  05/07/2016         HPI:   Kerri Carter is a 76 y.o. female who was admitted with severe abdominal pain. The patient was found to have a slightly elevated lipase on admission. The lipase was 63. The patient had normal lipase of 43 2 months ago. The patient's liver enzymes are also elevated with bilirubin of 3.2 alkaline phosphatase of 470 AST of 96 and ALT of 82. The patient was diagnosed with acute pancreatic orifice despite the lipase level not being 4 times the upper limit of normal or having any imaging consistent with pancreatitis. The patient also reports that her pain has resolved since yesterday. She reported that the pain was severe and closed with 10 out of 10 when she had a yesterday. The patient had vomiting that was nonbloody with right upper quadrant pain. The patient also reports that she had some mid chest pain at the same time. She had a similar episode of this 2 days prior to coming to the hospital and a week before that. The patient thought she may have a fever and chills so she decided to come to the emergency room.  Past Medical History:  Diagnosis Date  . Anxiety   . Asthma   . Chicken pox   . CKD (chronic kidney disease), stage III    a. s/p R nephrectomy.  . Conversion disorder   . COPD (chronic obstructive pulmonary disease) ([place])   . Depression   . Diabetes mellitus without complication ([place])    Patient denies Diabetes, does not take any diabetic medications  . Essential hypertension   . GERD (gastroesophageal reflux disease)   . Hyperlipidemia   . Inflammatory arthritis    a. hands/carpal  tunnel.  b. Low titer rheumatoid factor. c. Negative anti-CCP antibodies. d. Plaquenil.  . Non-Obstructive CAD    a. 07/2009 Cath ([facility]): nonobs dzs;  b. 03/2011 Cath [facility]): nonobs dzs.  . Osteoarthritis    a. Knees.  . PUD (peptic ulcer disease)   . Toxic maculopathy   . Valvular heart disease    a. 07/2015 Echo: EF 55-60%, Mild AI, AS, MR, and TR.    Past Surgical History:  Procedure Laterality Date  . APPENDECTOMY    . BUNIONECTOMY    . CESAREAN SECTION     x1  . COLONOSCOPY WITH PROPOFOL N/A 04/02/2016   Procedure: COLONOSCOPY WITH PROPOFOL;  Surgeon: Jonathon Bellows, MD;  Location: [facility] ENDOSCOPY;  Service: Endoscopy;  Laterality: N/A;  . ESOPHAGEAL DILATION  04/02/2016   Procedure: ESOPHAGEAL DILATION;  Surgeon: Jonathon Bellows, MD;  Location: [facility] ENDOSCOPY;  Service: Endoscopy;;  . ESOPHAGOGASTRODUODENOSCOPY (EGD) WITH PROPOFOL N/A 04/02/2016   Procedure: ESOPHAGOGASTRODUODENOSCOPY (EGD) WITH PROPOFOL;  Surgeon: Jonathon Bellows, MD;  Location: [facility] ENDOSCOPY;  Service: Endoscopy;  Laterality: N/A;  . 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 TONSILLECTOMY    . TOTAL HIP ARTHROPLASTY  12/10/11   [facility] left hip  . TUBAL LIGATION      Prior to Admission medications  Medication Sig Start Date End Date Taking? Authorizing Provider  albuterol (VENTOLIN HFA) 108 (90 Base) MCG/ACT inhaler Inhale 2 puffs into the lungs every 6 (six) hours as needed. 11/25/15  Yes Juanito Doom, MD  escitalopram (LEXAPRO) 10 MG tablet Take 1 tablet (10 mg total) by mouth daily. 04/22/16  Yes Rainey Pines, MD  folic acid (FOLVITE) 1 MG tablet Take 1 mg by mouth daily.   Yes Historical Provider, MD  furosemide (LASIX) 20 MG tablet take 1 tablet by mouth once daily 09/09/15  Yes Coral Spikes, DO  gabapentin (NEURONTIN) 300 MG capsule take 1 capsule by mouth four times a day Patient taking differently: take 1 capsule by mouth three times a day 02/21/16  Yes Coral Spikes, DO    HYDROcodone-acetaminophen (NORCO/VICODIN) 5-325 MG tablet take 1 to 2 tablets by mouth every 6 to 8 hours if needed for pain 01/07/16  Yes Historical Provider, MD  lamoTRIgine (LAMICTAL) 100 MG tablet Take 1 tablet (100 mg total) by mouth 2 (two) times daily. 04/22/16  Yes Rainey Pines, MD  LORazepam (ATIVAN) 0.5 MG tablet Take 1 tablet (0.5 mg total) by mouth at bedtime. Patient taking differently: Take 0.25 mg by mouth at bedtime.  04/22/16  Yes Rainey Pines, MD  lovastatin (MEVACOR) 20 MG tablet Take 1 tablet (20 mg total) by mouth at bedtime. 10/22/15  Yes Coral Spikes, DO  methotrexate 50 MG/2ML injection Inject 250 mg into the skin once a week. Inject 10 ml once weekly. Patient takes on Monday. 09/27/14  Yes Historical Provider, MD  montelukast (SINGULAIR) 10 MG tablet take 1 tablet by mouth once daily 12/02/15  Yes Jayce G Cook, DO  multivitamin-lutein (OCUVITE-LUTEIN) CAPS capsule Take 1 capsule by mouth 2 (two) times daily.   Yes Historical Provider, MD  nebivolol (BYSTOLIC) 10 MG tablet Take 1 tablet (10 mg total) by mouth daily. Patient taking differently: Take 5 mg by mouth daily.  07/23/15  Yes Juanito Doom, MD  pantoprazole (PROTONIX) 40 MG tablet take 1 tablet by mouth twice a day as directed 04/14/16  Yes Coral Spikes, DO  potassium chloride (K-DUR) 10 MEQ tablet take 1 tablet by mouth once daily 09/09/15  Yes Jayce G Cook, DO  QUEtiapine (SEROQUEL) 25 MG tablet Take 1 tablet (25 mg total) by mouth at bedtime. 04/22/16  Yes Rainey Pines, MD  RA NASAL ALLERGY 55 MCG/ACT AERO nasal inhaler instill 2 sprays into each nostril once daily 01/22/16  Yes Historical Provider, MD  ranitidine (ZANTAC) 150 MG capsule take 1 capsule by mouth twice a day 12/03/15  Yes Jayce G Cook, DO  sucralfate (CARAFATE) 1 GM/10ML suspension Take 10 mLs (1 g total) by mouth 4 (four) times daily. 04/28/16 05/29/16 Yes Jonathon Bellows, MD  BREO ELLIPTA 100-25 MCG/INH AEPB Inhale 1 puff into the lungs daily.  02/04/16    Historical Provider, MD    Family History  Problem Relation Age of Onset  . Rheum arthritis Mother   . Asthma Mother   . Parkinson's disease Mother   . Heart disease Mother   . Stroke Mother   . Hypertension Mother   . Heart attack Father   . Heart disease Father   . Hypertension Father   . Diabetes Son   . Asthma Sister   . Heart disease Sister   . Lung cancer Sister   . Heart disease Sister   . Heart disease Sister   . Breast cancer Sister   .  Heart attack Sister   . Heart disease Brother   . Heart disease Maternal Grandmother   . Diabetes Maternal Grandmother   . Colon cancer Maternal Grandmother   . Heart disease Brother   . Alcohol abuse Brother   . Depression Brother      Social History  Substance Use Topics  . Smoking status: Former Smoker    Packs/day: 0.50    Years: 20.00    Types: Cigarettes    Quit date: 02/02/1974  . Smokeless tobacco: Never Used  . Alcohol use No    Allergies as of 05/07/2016 - Review Complete 05/07/2016  Allergen Reaction Noted  . Ceftin [cefuroxime axetil] Anaphylaxis 09/08/2011  . Lisinopril Anaphylaxis 09/08/2011  . Antihistamines, chlorpheniramine-type  09/08/2011  . Antivert [meclizine hcl]  09/08/2011  . Decongestant [pseudoephedrine hcl]  09/08/2011  . Doxycycline  09/08/2011  . Morphine and related Other (See Comments) 06/21/2015  . Polymyxin b  02/28/2016  . Sulfa antibiotics  04/25/2014  . Xarelto [rivaroxaban]  01/19/2012  . Adhesive [tape] Rash 09/08/2011  . Iodine Hives and Rash 09/08/2011  . Levaquin [levofloxacin in d5w] Rash 09/08/2011  . Tetanus toxoids  09/08/2011    Review of Systems:    All systems reviewed and negative except where noted in HPI.   Physical Exam:  Vital signs in last 24 hours: Temp:  [97.7 F (36.5 C)-98.5 F (36.9 C)] 97.7 F (36.5 C) (04/05 0922) Pulse Rate:  [66-80] 71 (04/05 0922) Resp:  [16-18] 16 (04/05 0830) BP: (113-143)/(49-69) 113/51 (04/05 0922) SpO2:  [93 %-98 %] 96 %  (04/05 0958) Weight:  [167 lb (75.8 kg)] 167 lb (75.8 kg) (04/05 0506) Last BM Date: 05/06/16 General:   Pleasant, cooperative in NAD Head:  Normocephalic and atraumatic. Eyes:   No icterus.   Conjunctiva pink. PERRLA. Ears:  Normal auditory acuity. Neck:  Supple; no masses or thyroidomegaly Lungs: Respirations even and unlabored. Lungs clear to auscultation bilaterally.   No wheezes, crackles, or rhonchi.  Heart:  Regular rate and rhythm;  Without murmur, clicks, rubs or gallops Abdomen:  Soft, nondistended, nontender. Normal bowel sounds. No appreciable masses or hepatomegaly.  No rebound or guarding.  Rectal:  Not performed. Msk:  Symmetrical without gross deformities.   Extremities:  Without edema, cyanosis or clubbing. Neurologic:  Alert and oriented x3;  grossly normal neurologically. Skin:  Intact without significant lesions or rashes. Cervical Nodes:  No significant cervical adenopathy. Psych:  Alert and cooperative. Normal affect.  LAB RESULTS:  Recent Labs  05/07/16 0524  WBC 11.7*  HGB 11.2*  HCT 33.3*  PLT 238   BMET  Recent Labs  05/07/16 0524  NA 137  K 3.8  CL 103  CO2 27  GLUCOSE 189*  BUN 22*  CREATININE 1.42*  CALCIUM 8.6*   LFT  Recent Labs  05/07/16 0524  PROT 7.1  ALBUMIN 3.5  AST 96*  ALT 82*  ALKPHOS 470*  BILITOT 3.2*   PT/INR No results for input(s): LABPROT, INR in the last 72 hours.  STUDIES: US Abdomen Limited Ruq  Result Date: 05/07/2016 CLINICAL DATA:  Right upper quadrant pain and vomiting. Onset 11 hours ago. EXAM: US ABDOMEN LIMITED - RIGHT UPPER QUADRANT COMPARISON:  CT 02/25/2016 FINDINGS: Gallbladder: Multiple calculi are present, measuring up to 11 mm. Borderline gallbladder wall thickness, 3.8 mm. No pericholecystic fluid. No tenderness over the gallbladder during the exam, but the patient had been medicated. Common bile duct: Diameter: 7 mm. Liver: Mild coarsening of hepatic  parenchymal echotexture without focal  lesion. IMPRESSION: There are mild study limitations due to bowel gas. There is cholelithiasis and borderline gallbladder wall thickness. Mildly coarsened hepatic parenchymal echotexture may represent fatty infiltration, but is nonspecific. Electronically Signed   By: Andreas Newport M.D.   On: 05/07/2016 06:58      Impression / Plan:   LAKISHA PEYSER is a 76 y.o. y/o female with abnormal liver enzymes with a slightly elevated lipase. This is not consistent with the patient having pancreatitis in addition to a resolving so quickly. The patient will have an MRCP today to see if there is a common bile duct stone causing the patient's problems. The patient's ultrasound did show multiple stones measuring up to 11 mm with borderline gallbladder wall thickening. If the MRCP is positive the patient will go for an ERCP with stone extraction and if it is negative the patient should be considered for a laparoscopic cholecystectomy. The patient has been explained the plan and agrees with it.  Thank you for involving me in the care of this patient.      LOS: 0 days   Lucilla Lame, MD  05/07/2016, 1:38 PM   Note: This dictation was prepared with Dragon dictation along with smaller phrase technology. Any transcriptional errors that result from this process are unintentional.

## 2016-05-07 NOTE — Progress Notes (Signed)
Pharmacy Antibiotic Note  Kerri Carter is a 76 y.o. female admitted on 05/07/2016 with gallstone pancreatitis.  Pharmacy has been consulted for Zosyn dosing.  Plan: Zosyn 3.375g IV q8h (4 hour infusion). Will discuss possibility of d/c empiric abx in this patient unless infection is suspected.   Height: 5\' 4"  (162.6 cm) Weight: 167 lb (75.8 kg) IBW/kg (Calculated) : 54.7  Temp (24hrs), Avg:98.1 F (36.7 C), Min:97.7 F (36.5 C), Max:98.5 F (36.9 C)   Recent Labs Lab 05/07/16 0524  WBC 11.7*  CREATININE 1.42*    Estimated Creatinine Clearance: 35.1 mL/min (A) (by C-G formula based on SCr of 1.42 mg/dL (H)).    Allergies  Allergen Reactions  . Ceftin [Cefuroxime Axetil] Anaphylaxis    Other reaction(s): Other (See Comments) REACTION: tongue and throat swell Other Reaction: TONGUE AND THROAT SWELLING REACTION: tongue and throat swell  . Lisinopril Anaphylaxis    REACTION: tongue and throat swelling (onset 10-10-09)  . Antihistamines, Chlorpheniramine-Type     REACTION: makes pt hyper  . Antivert [Meclizine Hcl]     Other reaction(s): Unknown REACTION: bladder will not empty REACTION: bladder will not empty  . Decongestant [Pseudoephedrine Hcl]     Other reaction(s): Unknown REACTION: makes pt hyper REACTION: makes pt hyper  . Doxycycline     REACTION: GI upset  . Morphine And Related Other (See Comments)    Per patient, low blood pressure issues, following morphine, that requires action to raise it back up.  . Polymyxin B     Medication was in eye drops.  . Sulfa Antibiotics     Face swelling  . Xarelto [Rivaroxaban]     Other reaction(s): Other (See Comments) GI bleed Stomach burning, bleeding, and tar in stool GI bleed  . Adhesive [Tape] Rash  . Iodine Hives and Rash  . Levaquin [Levofloxacin In D5w] Rash  . Tetanus Toxoids     REACTION: rash, fever, hot to touch at injection site    Antimicrobials this admission: Zosyn 4/5 >>   Dose adjustments this  admission:  Microbiology results:  Thank you for allowing pharmacy to be a part of this patient's care.  Ulice Dash D 05/07/2016 10:15 AM

## 2016-05-07 NOTE — ED Notes (Signed)
Patient transported to Ultrasound 

## 2016-05-07 NOTE — ED Notes (Signed)
Patient reports having right upper quad abdominal pain and mid sternal chest pain since Wednesday.  Reports seen in cardiologist office on Wednesday and told probably her gallbladder.  Reports pain worse tonight and come to ED.

## 2016-05-07 NOTE — Progress Notes (Signed)
[place] [facility] [place] NAME: Kerri Carter    MR#:  [id number]  DATE OF BIRTH:  [birth year]  SUBJECTIVE:  Came in with significant right upper quadrant abdominal pain and found to have elevated lipase and bilirubin. Patient is being admitted for gallstone pancreatitis. Patient stays pain much improved husband in the room REVIEW OF SYSTEMS:   Review of Systems  Constitutional: Negative for chills, fever and weight loss.  HENT: Negative for ear discharge, ear pain and nosebleeds.   Eyes: Negative for blurred vision, pain and discharge.  Respiratory: Negative for sputum production, shortness of breath, wheezing and stridor.   Cardiovascular: Negative for chest pain, palpitations, orthopnea and PND.  Gastrointestinal: Positive for abdominal pain. Negative for diarrhea, nausea and vomiting.  Genitourinary: Negative for frequency and urgency.  Musculoskeletal: Negative for back pain and joint pain.  Neurological: Negative for sensory change, speech change, focal weakness and weakness.  Psychiatric/Behavioral: Negative for depression and hallucinations. The patient is not nervous/anxious.    Tolerating Diet:npo Tolerating PT: pending  DRUG ALLERGIES:   Allergies  Allergen Reactions  . Ceftin [Cefuroxime Axetil] Anaphylaxis    Other reaction(s): Other (See Comments) REACTION: tongue and throat swell Other Reaction: TONGUE AND THROAT SWELLING REACTION: tongue and throat swell  . Lisinopril Anaphylaxis    REACTION: tongue and throat swelling (onset 10-10-09)  . Antihistamines, Chlorpheniramine-Type     REACTION: makes pt hyper  . Antivert [Meclizine Hcl]     Other reaction(s): Unknown REACTION: bladder will not empty REACTION: bladder will not empty  . Decongestant [Pseudoephedrine Hcl]     Other reaction(s): Unknown REACTION: makes pt hyper REACTION: makes pt hyper  . Doxycycline     REACTION: GI upset  . Morphine And Related Other  (See Comments)    Per patient, low blood pressure issues, following morphine, that requires action to raise it back up.  . Polymyxin B     Medication was in eye drops.  . Sulfa Antibiotics     Face swelling  . Xarelto [Rivaroxaban]     Other reaction(s): Other (See Comments) GI bleed Stomach burning, bleeding, and tar in stool GI bleed  . Adhesive [Tape] Rash  . Iodine Hives and Rash  . Levaquin [Levofloxacin In D5w] Rash  . Tetanus Toxoids     REACTION: rash, fever, hot to touch at injection site    VITALS:  Blood pressure (!) 113/51, pulse 71, temperature 97.7 F (36.5 C), resp. rate 16, height 5\' 4"  (1.626 m), weight 75.8 kg (167 lb), SpO2 96 %.  PHYSICAL EXAMINATION:   Physical Exam  GENERAL:  76 y.o.-year-old patient lying in the bed with no acute distress. obese EYES: Pupils equal, round, reactive to light and accommodation. No scleral icterus. Extraocular muscles intact.  HEENT: Head atraumatic, normocephalic. Oropharynx and nasopharynx clear.  NECK:  Supple, no jugular venous distention. No thyroid enlargement, no tenderness.  LUNGS: Normal breath sounds bilaterally, no wheezing, rales, rhonchi. No use of accessory muscles of respiration.  CARDIOVASCULAR: S1, S2 normal. No murmurs, rubs, or gallops.  ABDOMEN: Soft, nontender, nondistended. Bowel sounds present. No organomegaly or mass.  EXTREMITIES: No cyanosis, clubbing or edema b/l.    NEUROLOGIC: Cranial nerves II through XII are intact. No focal Motor or sensory deficits b/l.   PSYCHIATRIC:  patient is alert and oriented x 3.  SKIN: No obvious rash, lesion, or ulcer.   LABORATORY PANEL:  CBC  Recent Labs Lab 05/07/16 0524  WBC 11.7*  HGB 11.2*  HCT 33.3*  PLT 238    Chemistries   Recent Labs Lab 05/07/16 0524  NA 137  K 3.8  CL 103  CO2 27  GLUCOSE 189*  BUN 22*  CREATININE 1.42*  CALCIUM 8.6*  AST 96*  ALT 82*  ALKPHOS 470*  BILITOT 3.2*   Cardiac Enzymes  Recent Labs Lab  05/07/16 0524  TROPONINI <0.03   RADIOLOGY:  US Abdomen Limited Ruq  Result Date: 05/07/2016 CLINICAL DATA:  Right upper quadrant pain and vomiting. Onset 11 hours ago. EXAM: US ABDOMEN LIMITED - RIGHT UPPER QUADRANT COMPARISON:  CT 02/25/2016 FINDINGS: Gallbladder: Multiple calculi are present, measuring up to 11 mm. Borderline gallbladder wall thickness, 3.8 mm. No pericholecystic fluid. No tenderness over the gallbladder during the exam, but the patient had been medicated. Common bile duct: Diameter: 7 mm. Liver: Mild coarsening of hepatic parenchymal echotexture without focal lesion. IMPRESSION: There are mild study limitations due to bowel gas. There is cholelithiasis and borderline gallbladder wall thickness. Mildly coarsened hepatic parenchymal echotexture may represent fatty infiltration, but is nonspecific. Electronically Signed   By: Andreas Newport M.D.   On: 05/07/2016 06:58   ASSESSMENT AND PLAN:  76 year old female admitted for gallstone pancreatitis.  1. Gallstone pancreatitis: -GI consult noted -MRCP ordered -Surgical consultation  2. Chronic kidney disease: Stage III; hydrate with intravenous fluid. Avoid nephrotoxic agents  3. Hypertension: Controlled; continue nebivolol  4. COPD: Stable; continue inhaled corticosteroid as well as albuterol. Add Spiriva  5. Rheumatoid arthritis: Continue methotrexate postoperatively  6. Lipidemia: Continue statin therapy  7. Lower extremity edema: Trace; secondary to venous insufficiency as well as mild valvular heart disease. Continue Lasix per home regimen  8. Psychiatric issues: Continue Lamictal and Seroquel  9. DVT prophylaxis: SCDs  10. GI prophylaxis: Pantoprazole per home regimen  Case discussed with Care Management/Social Worker. Management plans discussed with the patient, family and they are in agreement.  CODE STATUS: full   TOTAL TIME TAKING CARE OF THIS PATIENT: 30 minutes.  >50% time spent on counselling and  coordination of care  POSSIBLE D/C IN 1-2 DAYS, DEPENDING ON CLINICAL CONDITION.  Note: This dictation was prepared with Dragon dictation along with smaller phrase technology. Any transcriptional errors that result from this process are unintentional.  Mylinda Brook M.D on 05/07/2016 at 12:26 PM  Between 7am to 6pm - Pager - [phone]  After 6pm go to [email or web address] - password [email or web address] [facility]  Office  [phone]  CC: Primary care physician; Coral Spikes, DO

## 2016-05-07 NOTE — ED Notes (Signed)
Dr Diamond in with patient and family. 

## 2016-05-07 NOTE — ED Notes (Signed)
Patient has returned to room and reports feeling better at this time.

## 2016-05-07 NOTE — Consult Note (Signed)
Patient ID: Kerri Carter, female   DOB: 17-Sep-1940, 76 y.o.   MRN: [id number]  CC: ABDOMINAL PAIN  HPI Kerri Carter is a 76 y.o. female who is currently admitted to the medicine service. Surgery consult was requested by Dr. Posey Pronto for evaluation of possible cholecystitis with choledocholithiasis. Patient reports that she's had numerous attacks of abdominal pain over the last many months but were never spends this time. She reported to the hospital after 2 days of abdominal pain with associated nausea and vomiting. Her pain was in her right upper quadrant. At the time of my consultation she states that her pain is mostly resolved. She was noted to have numerous lab abnormalities and has also been evaluated by gastroenterology. Patient reports that she is currently having chills. She denies any fevers, chest pain, shortness of breath, diarrhea, constipation. She is otherwise been in her usual state of health  HPI  Past Medical History:  Diagnosis Date  . Anxiety   . Asthma   . Chicken pox   . CKD (chronic kidney disease), stage III    a. s/p R nephrectomy.  . Conversion disorder   . COPD (chronic obstructive pulmonary disease) ([place])   . Depression   . Diabetes mellitus without complication ([place])    Patient denies Diabetes, does not take any diabetic medications  . Essential hypertension   . GERD (gastroesophageal reflux disease)   . Hyperlipidemia   . Inflammatory arthritis    a. hands/carpal tunnel.  b. Low titer rheumatoid factor. c. Negative anti-CCP antibodies. d. Plaquenil.  . Non-Obstructive CAD    a. 07/2009 Cath ([facility]): nonobs dzs;  b. 03/2011 Cath [facility]): nonobs dzs.  . Osteoarthritis    a. Knees.  . PUD (peptic ulcer disease)   . Toxic maculopathy   . Valvular heart disease    a. 07/2015 Echo: EF 55-60%, Mild AI, AS, MR, and TR.    Past Surgical History:  Procedure Laterality Date  . APPENDECTOMY    . BUNIONECTOMY    . CESAREAN SECTION     x1  . COLONOSCOPY WITH PROPOFOL  N/A 04/02/2016   Procedure: COLONOSCOPY WITH PROPOFOL;  Surgeon: Jonathon Bellows, MD;  Location: [facility] ENDOSCOPY;  Service: Endoscopy;  Laterality: N/A;  . ESOPHAGEAL DILATION  04/02/2016   Procedure: ESOPHAGEAL DILATION;  Surgeon: Jonathon Bellows, MD;  Location: [facility] ENDOSCOPY;  Service: Endoscopy;;  . ESOPHAGOGASTRODUODENOSCOPY (EGD) WITH PROPOFOL N/A 04/02/2016   Procedure: ESOPHAGOGASTRODUODENOSCOPY (EGD) WITH PROPOFOL;  Surgeon: Jonathon Bellows, MD;  Location: [facility] ENDOSCOPY;  Service: Endoscopy;  Laterality: N/A;  . 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 TONSILLECTOMY    . TOTAL HIP ARTHROPLASTY  12/10/11   [facility] left hip  . TUBAL LIGATION      Family History  Problem Relation Age of Onset  . Rheum arthritis Mother   . Asthma Mother   . Parkinson's disease Mother   . Heart disease Mother   . Stroke Mother   . Hypertension Mother   . Heart attack Father   . Heart disease Father   . Hypertension Father   . Diabetes Son   . Asthma Sister   . Heart disease Sister   . Lung cancer Sister   . Heart disease Sister   . Heart disease Sister   . Breast cancer Sister   . Heart attack Sister   . Heart disease Brother   . Heart disease Maternal Grandmother   . Diabetes  Maternal Grandmother   . Colon cancer Maternal Grandmother   . Heart disease Brother   . Alcohol abuse Brother   . Depression Brother     Social History Social History  Substance Use Topics  . Smoking status: Former Smoker    Packs/day: 0.50    Years: 20.00    Types: Cigarettes    Quit date: 02/02/1974  . Smokeless tobacco: Never Used  . Alcohol use No    Allergies  Allergen Reactions  . Ceftin [Cefuroxime Axetil] Anaphylaxis    Other reaction(s): Other (See Comments) REACTION: tongue and throat swell Other Reaction: TONGUE AND THROAT SWELLING REACTION: tongue and throat swell  . Lisinopril Anaphylaxis    REACTION: tongue and throat swelling (onset 10-10-09)  .  Antihistamines, Chlorpheniramine-Type     REACTION: makes pt hyper  . Antivert [Meclizine Hcl]     Other reaction(s): Unknown REACTION: bladder will not empty REACTION: bladder will not empty  . Decongestant [Pseudoephedrine Hcl]     Other reaction(s): Unknown REACTION: makes pt hyper REACTION: makes pt hyper  . Doxycycline     REACTION: GI upset  . Morphine And Related Other (See Comments)    Per patient, low blood pressure issues, following morphine, that requires action to raise it back up.  . Polymyxin B     Medication was in eye drops.  . Sulfa Antibiotics     Face swelling  . Xarelto [Rivaroxaban]     Other reaction(s): Other (See Comments) GI bleed Stomach burning, bleeding, and tar in stool GI bleed  . Adhesive [Tape] Rash  . Iodine Hives and Rash  . Levaquin [Levofloxacin In D5w] Rash  . Tetanus Toxoids     REACTION: rash, fever, hot to touch at injection site    Current Facility-Administered Medications  Medication Dose Route Frequency Provider Last Rate Last Dose  . 0.9 %  sodium chloride infusion   Intravenous Continuous Harrie Foreman, MD 125 mL/hr at 05/07/16 1035    . [START ON 05/08/2016] acetaminophen (TYLENOL) tablet 650 mg  650 mg Oral Q6H PRN Harrie Foreman, MD       Or  . Derrill Memo ON 05/08/2016] acetaminophen (TYLENOL) suppository 650 mg  650 mg Rectal Q6H PRN Harrie Foreman, MD      . Derrill Memo ON 05/08/2016] docusate sodium (COLACE) capsule 100 mg  100 mg Oral BID Harrie Foreman, MD      . Derrill Memo ON 05/08/2016] escitalopram (LEXAPRO) tablet 10 mg  10 mg Oral Daily Harrie Foreman, MD      . fluticasone furoate-vilanterol (BREO ELLIPTA) 100-25 MCG/INH 1 puff  1 puff Inhalation Daily Harrie Foreman, MD      . Derrill Memo ON 07/03/4429] folic acid (FOLVITE) tablet 1 mg  1 mg Oral Daily Harrie Foreman, MD      . Derrill Memo ON 05/08/2016] furosemide (LASIX) tablet 20 mg  20 mg Oral Daily Harrie Foreman, MD      . Derrill Memo ON 05/08/2016] lamoTRIgine (LAMICTAL)  tablet 100 mg  100 mg Oral BID Harrie Foreman, MD      . morphine 2 MG/ML injection 1-2 mg  1-2 mg Intravenous Q4H PRN Fritzi Mandes, MD      . nebivolol (BYSTOLIC) tablet 5 mg  5 mg Oral Daily Harrie Foreman, MD   5 mg at 05/07/16 1626  . ondansetron (ZOFRAN) tablet 4 mg  4 mg Oral Q6H PRN Harrie Foreman, MD       Or  .  ondansetron (ZOFRAN) injection 4 mg  4 mg Intravenous Q6H PRN Harrie Foreman, MD      . pantoprazole (PROTONIX) EC tablet 40 mg  40 mg Oral BID Harrie Foreman, MD   40 mg at 05/07/16 1035     Review of Systems A Multi-point review of systems was asked and was negative except for the findings documented in the history of present illness  Physical Exam Blood pressure (!) 137/49, pulse 82, temperature 99.6 F (37.6 C), temperature source Oral, resp. rate 15, height 5\' 4"  (1.626 m), weight 75.8 kg (167 lb), SpO2 96 %. CONSTITUTIONAL: No acute distress. EYES: Pupils are equal, round, and reactive to light, Sclera are non-icteric. EARS, NOSE, MOUTH AND THROAT: The oropharynx is clear. The oral mucosa is pink and moist. Hearing is intact to voice. LYMPH NODES:  Lymph nodes in the neck are normal. RESPIRATORY:  Lungs are clear. There is normal respiratory effort, with equal breath sounds bilaterally, and without pathologic use of accessory muscles. CARDIOVASCULAR: Heart is regular without murmurs, gallops, or rubs. GI: The abdomen is soft, mildly tender to palpation in the right upper quadrant with a negative Murphy sign on exam, and nondistended. There are no palpable masses. There is no hepatosplenomegaly. There are normal bowel sounds in all quadrants. GU: Rectal deferred.   MUSCULOSKELETAL: Normal muscle strength and tone. No cyanosis or edema.   SKIN: Turgor is good and there are no pathologic skin lesions or ulcers. NEUROLOGIC: Motor and sensation is grossly normal. Cranial nerves are grossly intact. PSYCH:  Oriented to person, place and time. Affect is  normal.  Data Reviewed Images and labs reviewed. Labs show mild leukocytosis of 11.7, and elevated bilirubin of 3.2, mild increase of transaminases with an AST of 96, ALT of 82, alkaline phosphatase visit for 70. Mildly elevated lipase at 63. Grade of 1.42, BUN of 22. Urinalysis is contaminated but does show many bacteria. Ultrasound right upper quadrant shows numerous gallstones and borderline gallbladder wall thickening. MRCP shows 2 retained stones within the common bile duct. I have personally reviewed the patient's imaging, laboratory findings and medical records.    Assessment    Choledocholithiasis    Plan    76 year old female with choledocholithiasis. Patient is started been evaluated by gastroenterology and will require an ERCP prior to laparoscopic cholecystectomy. Discussed the diagnosis at length with the patient to include the treatment options and timeframes. Patient and her husband voiced understanding. General surgery will continue to follow with you, possible cholecystectomy over the weekend versus near future.     Time spent with the patient was 80 minutes, with more than 50% of the time spent in face-to-face education, counseling and care coordination.     Clayburn Pert, MD FACS General Surgeon 05/07/2016, 5:23 PM

## 2016-05-07 NOTE — ED Provider Notes (Signed)
[facility] Emergency Department Provider Note   First MD Initiated Contact with Patient 05/07/16 [phone]     (approximate)  I have reviewed the triage vital signs and the nursing notes.   HISTORY  Chief Complaint Abdominal Pain   HPI Kerri Carter is a 76 y.o. female with below list of chronic medical conditions presents to the emergency department with right upper quadrant abdominal pain associated with nausea and vomiting 2 days. Patient states that she was seen by her cardiologist and notified yesterday evening that her liver enzymes are elevated as well as her white blood cell count. Patient states her current pain score is 10 out of 10. She denies any aggravating or alleviating factors.   Past Medical History:  Diagnosis Date  . Anxiety   . Chicken pox   . CKD (chronic kidney disease), stage III    a. s/p R nephrectomy.  . Conversion disorder   . COPD (chronic obstructive pulmonary disease) ([place])   . Depression   . Diabetes mellitus without complication ([place])    Patient denies Diabetes, does not take any diabetic medications  . Essential hypertension   . GERD (gastroesophageal reflux disease)   . Hyperlipidemia   . Inflammatory arthritis    a. hands/carpal tunnel.  b. Low titer rheumatoid factor. c. Negative anti-CCP antibodies. d. Plaquenil.  . Non-Obstructive CAD    a. 07/2009 Cath ([facility]): nonobs dzs;  b. 03/2011 Cath [facility]): nonobs dzs.  . Osteoarthritis    a. Knees.  . PUD (peptic ulcer disease)   . Toxic maculopathy   . Valvular heart disease    a. 07/2015 Echo: EF 55-60%, Mild AI, AS, MR, and TR.    Patient Active Problem List   Diagnosis Date Noted  . LLQ pain 02/29/2016  . Mixed bipolar I disorder ([place]) 10/09/2015  . CKD (chronic kidney disease), stage III   . Essential hypertension   . Anemia 08/15/2015  . Conversion disorder 08/04/2015  . Anxiety and depression 07/17/2015  . Hyperlipidemia 07/17/2015  . Toxic maculopathy from  plaquenil in therapeutic use 07/17/2015  . Macular degeneration 07/17/2015  . Insomnia 07/17/2015  . Rheumatoid arthritis ([place]) 12/05/2014  . Valvular heart disease 10/13/2011  . COPD (chronic obstructive pulmonary disease) ([place]) 09/09/2011  . OSA (obstructive sleep apnea) 09/09/2011  . Nocturnal hypoxemia 09/09/2011  . Gastroesophageal reflux disease 02/24/2011  . Dyslipidemia 02/24/2011  . Nonrheumatic aortic valve insufficiency 02/24/2011  . Non-rheumatic mitral regurgitation 02/24/2011    Past Surgical History:  Procedure Laterality Date  . APPENDECTOMY    . BUNIONECTOMY    . CESAREAN SECTION     x1  . COLONOSCOPY WITH PROPOFOL N/A 04/02/2016   Procedure: COLONOSCOPY WITH PROPOFOL;  Surgeon: Jonathon Bellows, MD;  Location: [facility] ENDOSCOPY;  Service: Endoscopy;  Laterality: N/A;  . ESOPHAGEAL DILATION  04/02/2016   Procedure: ESOPHAGEAL DILATION;  Surgeon: Jonathon Bellows, MD;  Location: [facility] ENDOSCOPY;  Service: Endoscopy;;  . ESOPHAGOGASTRODUODENOSCOPY (EGD) WITH PROPOFOL N/A 04/02/2016   Procedure: ESOPHAGOGASTRODUODENOSCOPY (EGD) WITH PROPOFOL;  Surgeon: Jonathon Bellows, MD;  Location: [facility] ENDOSCOPY;  Service: Endoscopy;  Laterality: N/A;  . 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 TONSILLECTOMY    . TOTAL HIP ARTHROPLASTY  12/10/11   [facility] left hip  . TUBAL LIGATION      Prior to Admission medications   Medication Sig Start Date End Date Taking? Authorizing Provider  albuterol (VENTOLIN HFA) 108 (90 Base)  MCG/ACT inhaler Inhale 2 puffs into the lungs every 6 (six) hours as needed. 11/25/15   Juanito Doom, MD  amoxicillin-clavulanate (AUGMENTIN) 875-125 MG tablet Take 1 tablet by mouth 2 (two) times daily. for 10 days 01/22/16   Historical Provider, MD  aspirin 81 MG tablet Take 81 mg by mouth daily.    Historical Provider, MD  B-D INS SYR ULTRAFINE 1CC/30G 30G X 1/2" 1 ML MISC  04/06/16   Historical Provider, MD  BD INSULIN SYRINGE ULTRAFINE  31G X 15/64" 1 ML MISC  02/04/16   Historical Provider, MD  BREO ELLIPTA 100-25 MCG/INH AEPB Inhale 1 puff into the lungs daily.  02/04/16   Historical Provider, MD  dicyclomine (BENTYL) 20 MG tablet take 1 tablet by mouth three times a day if needed for STOMACH SPASM 02/26/16   Historical Provider, MD  DUREZOL 0.05 % EMUL instill 1 drop into left eye twice a day 01/23/16   Historical Provider, MD  escitalopram (LEXAPRO) 10 MG tablet Take 1 tablet (10 mg total) by mouth daily. 04/22/16   Rainey Pines, MD  folic acid (FOLVITE) 1 MG tablet Take 1 mg by mouth daily.    Historical Provider, MD  furosemide (LASIX) 20 MG tablet take 1 tablet by mouth once daily 09/09/15   Coral Spikes, DO  gabapentin (NEURONTIN) 300 MG capsule take 1 capsule by mouth four times a day Patient taking differently: take 1 capsule by mouth three times a day 02/21/16   Coral Spikes, DO  HYDROcodone-acetaminophen (NORCO/VICODIN) 5-325 MG tablet take 1 to 2 tablets by mouth every 6 to 8 hours if needed for pain 01/07/16   Historical Provider, MD  lamoTRIgine (LAMICTAL) 100 MG tablet Take 1 tablet (100 mg total) by mouth 2 (two) times daily. 04/22/16   Rainey Pines, MD  LORazepam (ATIVAN) 0.5 MG tablet Take 1 tablet (0.5 mg total) by mouth at bedtime. Patient taking differently: Take 0.25 mg by mouth at bedtime.  04/22/16   Rainey Pines, MD  lovastatin (MEVACOR) 20 MG tablet Take 1 tablet (20 mg total) by mouth at bedtime. 10/22/15   Coral Spikes, DO  methotrexate 50 MG/2ML injection Inject 250 mg into the skin once a week. Inject 10 ml once weekly. Patient takes on Monday. 09/27/14   Historical Provider, MD  montelukast (SINGULAIR) 10 MG tablet take 1 tablet by mouth once daily 12/02/15   Coral Spikes, DO  multivitamin-lutein (OCUVITE-LUTEIN) CAPS capsule Take 1 capsule by mouth 2 (two) times daily.    Historical Provider, MD  nebivolol (BYSTOLIC) 10 MG tablet Take 1 tablet (10 mg total) by mouth daily. Patient taking differently: Take 5 mg by  mouth daily.  07/23/15   Juanito Doom, MD  pantoprazole (PROTONIX) 40 MG tablet take 1 tablet by mouth twice a day as directed 04/14/16   Coral Spikes, DO  potassium chloride (K-DUR) 10 MEQ tablet take 1 tablet by mouth once daily 09/09/15   Coral Spikes, DO  QUEtiapine (SEROQUEL) 25 MG tablet Take 1 tablet (25 mg total) by mouth at bedtime. 04/22/16   Rainey Pines, MD  RA NASAL ALLERGY 55 MCG/ACT AERO nasal inhaler instill 2 sprays into each nostril once daily 01/22/16   Historical Provider, MD  ranitidine (ZANTAC) 150 MG capsule take 1 capsule by mouth twice a day 12/03/15   Coral Spikes, DO  sucralfate (CARAFATE) 1 GM/10ML suspension Take 10 mLs (1 g total) by mouth 4 (four) times daily. 04/28/16 05/29/16  Jonathon Bellows, MD    Allergies Ceftin [cefuroxime axetil]; Lisinopril; Antihistamines, chlorpheniramine-type; Antivert [meclizine hcl]; Decongestant [pseudoephedrine hcl]; Doxycycline; Morphine and related; Polymyxin b; Sulfa antibiotics; Xarelto [rivaroxaban]; Adhesive [tape]; Iodine; Levaquin [levofloxacin in d5w]; and Tetanus toxoids  Family History  Problem Relation Age of Onset  . Rheum arthritis Mother   . Asthma Mother   . Parkinson's disease Mother   . Heart disease Mother   . Stroke Mother   . Hypertension Mother   . Heart attack Father   . Heart disease Father   . Hypertension Father   . Diabetes Son   . Asthma Sister   . Heart disease Sister   . Lung cancer Sister   . Heart disease Sister   . Heart disease Sister   . Breast cancer Sister   . Heart attack Sister   . Heart disease Brother   . Heart disease Maternal Grandmother   . Diabetes Maternal Grandmother   . Colon cancer Maternal Grandmother   . Heart disease Brother   . Alcohol abuse Brother   . Depression Brother     Social History Social History  Substance Use Topics  . Smoking status: Former Smoker    Packs/day: 0.50    Years: 20.00    Types: Cigarettes    Quit date: 02/02/1974  . Smokeless tobacco:  Never Used  . Alcohol use No    Review of Systems Constitutional: No fever/chills Eyes: No visual changes. ENT: No sore throat. Cardiovascular: Denies chest pain. Respiratory: Denies shortness of breath. Gastrointestinal: Positive for abdominal pain nausea and vomiting Genitourinary: Negative for dysuria. Musculoskeletal: Negative for back pain. Skin: Negative for rash. Neurological: Negative for headaches, focal weakness or numbness.  10-point ROS otherwise negative.  ____________________________________________   PHYSICAL EXAM:  VITAL SIGNS: ED Triage Vitals  Enc Vitals Group     BP 05/07/16 0505 (!) 129/49     Pulse Rate 05/07/16 0505 66     Resp 05/07/16 0505 18     Temp 05/07/16 0505 98.5 F (36.9 C)     Temp Source 05/07/16 0505 Oral     SpO2 05/07/16 0505 96 %     Weight 05/07/16 0506 167 lb (75.8 kg)     Height 05/07/16 0506 5\' 4"  (1.626 m)     Head Circumference --      Peak Flow --      Pain Score 05/07/16 0504 10     Pain Loc --      Pain Edu? --      Excl. in [place]? --     Constitutional: Alert and oriented. Apparent discomfort Eyes: Conjunctivae are normal. PERRL. EOMI. Head: Atraumatic. Ears:  Healthy appearing ear canals and TMs bilaterally Nose: No congestion/rhinnorhea. Mouth/Throat: Mucous membranes are moist. Oropharynx non-erythematous Neck: No stridor.   Cardiovascular: Normal rate, regular rhythm. Good peripheral circulation. Grossly normal heart sounds. Respiratory: Normal respiratory effort.  No retractions. Lungs CTAB. Gastrointestinal: Right upper quadrant tenderness to palpation. No distention.  Musculoskeletal: No lower extremity tenderness nor edema. No gross deformities of extremities. Neurologic:  Normal speech and language. No gross focal neurologic deficits are appreciated.  Skin:  Skin is warm, dry and intact. No rash noted.   ____________________________________________   LABS (all labs ordered are listed, but only abnormal  results are displayed)  Labs Reviewed  LIPASE, BLOOD - Abnormal; Notable for the following:       Result Value   Lipase 63 (*)    All other components within normal  limits  COMPREHENSIVE METABOLIC PANEL - Abnormal; Notable for the following:    Glucose, Bld 189 (*)    BUN 22 (*)    Creatinine, Ser 1.42 (*)    Calcium 8.6 (*)    AST 96 (*)    ALT 82 (*)    Alkaline Phosphatase 470 (*)    Total Bilirubin 3.2 (*)    GFR calc non Af Amer 36 (*)    GFR calc Af Amer 41 (*)    All other components within normal limits  CBC - Abnormal; Notable for the following:    WBC 11.7 (*)    RBC 3.71 (*)    Hemoglobin 11.2 (*)    HCT 33.3 (*)    RDW 18.7 (*)    All other components within normal limits  TROPONIN I  URINALYSIS, COMPLETE (UACMP) WITH MICROSCOPIC   ____________________________________________  EKG  ED ECG REPORT I, [place] Catilyn Boggus, the attending physician, personally viewed and interpreted this ECG.   Date: 05/07/2016  EKG Time: 5:09 AM  Rate: 60  Rhythm: Normal sinus rhythm  Axis: Normal  Intervals: Normal  ST&T Change: None  ____________________________________________  RADIOLOGY I, [place] Tinnie Kunin, personally viewed and evaluated these images (plain radiographs) as part of my medical decision making, as well as reviewing the written report by the radiologist  No results found.    Procedures   ____________________________________________   INITIAL IMPRESSION / ASSESSMENT AND PLAN / ED COURSE  Pertinent labs & imaging results that were available during my care of the patient were reviewed by me and considered in my medical decision making (see chart for details).  75 of them of presenting to the emergency department right upper quadrant abdominal pain associated with nausea and vomiting. History of physical exam concerning for possible cholelithiasis/cholecystitis. Patient was notified by her cardiologist and her liver enzymes were elevated as well as her  white blood cell count. As such as raised the possibility of gallstone pancreatitis. Laboratory data revealed elevated bilirubin 3.2, elevated liver enzymes AST and ALT 96 and 82 respectively. Ultrasound revealed multiple gallstones with a dilated common bile duct and a such patient discussed with Dr. Marcille Blanco for hospital admission for further evaluation and management.      ____________________________________________  FINAL CLINICAL IMPRESSION(S) / ED DIAGNOSES  Final diagnoses:  Vomiting  RUQ pain  Gallstone pancreatitis  Calculus of gallbladder and bile duct with obstruction without cholecystitis     MEDICATIONS GIVEN DURING THIS VISIT:  Medications  ondansetron (ZOFRAN) injection 4 mg (4 mg Intravenous Given 05/07/16 0531)  HYDROmorphone (DILAUDID) injection 0.5 mg (0.5 mg Intravenous Given 05/07/16 0537)     NEW OUTPATIENT MEDICATIONS STARTED DURING THIS VISIT:  New Prescriptions   No medications on file    Modified Medications   No medications on file    Discontinued Medications   No medications on file     Note:  This document was prepared using Dragon voice recognition software and may include unintentional dictation errors.    Gregor Hams, MD 05/07/16 [phone]

## 2016-05-07 NOTE — Plan of Care (Signed)
Problem: Pain Managment: Goal: General experience of comfort will improve Outcome: Progressing Patient reports pain is much improved.

## 2016-05-07 NOTE — ED Notes (Signed)
Report received 

## 2016-05-07 NOTE — ED Triage Notes (Signed)
Pt presents to ED with c/o generalized abd pain that is worse in the right upper quadrant. Pt states she had similar pain Wednesday and was seen by cardiologist and told she had an elevated WBC and liver enzymes. Pt moaning in triage and asking for pain medication.

## 2016-05-08 ENCOUNTER — Encounter: Payer: Self-pay | Admitting: *Deleted

## 2016-05-08 ENCOUNTER — Encounter
Admission: EM | Disposition: A | Discharge status: Skilled Nursing Facility | Payer: Self-pay | Source: Home / Self Care | Attending: Internal Medicine

## 2016-05-08 ENCOUNTER — Inpatient Hospital Stay: Payer: Medicare Other | Admitting: Certified Registered Nurse Anesthetist

## 2016-05-08 DIAGNOSIS — K8051 Calculus of bile duct without cholangitis or cholecystitis with obstruction: Secondary | ICD-10-CM

## 2016-05-08 HISTORY — PX: ENDOSCOPIC RETROGRADE CHOLANGIOPANCREATOGRAPHY (ERCP) WITH PROPOFOL: SHX5810

## 2016-05-08 LAB — GLUCOSE, CAPILLARY
Glucose-Capillary: 89 mg/dL (ref 65–99)
Glucose-Capillary: 85 mg/dL (ref 65–99)
Glucose-Capillary: 119 mg/dL — ABNORMAL HIGH (ref 65–99)
Glucose-Capillary: 106 mg/dL — ABNORMAL HIGH (ref 65–99)

## 2016-05-08 LAB — HEMOGLOBIN A1C
Hgb A1c MFr Bld: 5.7 % — ABNORMAL HIGH (ref 4.8–5.6)
Mean Plasma Glucose: 117 mg/dL

## 2016-05-08 SURGERY — ENDOSCOPIC RETROGRADE CHOLANGIOPANCREATOGRAPHY (ERCP) WITH PROPOFOL
Anesthesia: General

## 2016-05-08 MED ORDER — INDOMETHACIN 50 MG RE SUPP
100.0000 mg | Freq: Once | RECTAL | Status: AC
  Administered 2016-05-08: 100 mg via RECTAL

## 2016-05-08 MED ORDER — PROPOFOL 10 MG/ML IV BOLUS
INTRAVENOUS | Status: DC | PRN
  Administered 2016-05-08: 100 mg via INTRAVENOUS

## 2016-05-08 MED ORDER — ONDANSETRON HCL 4 MG/2ML IJ SOLN: 4.0000 mg | Freq: Once | INTRAMUSCULAR | Status: DC | PRN

## 2016-05-08 MED ORDER — FENTANYL CITRATE (PF) 100 MCG/2ML IJ SOLN
INTRAMUSCULAR | Status: AC
  Filled 2016-05-08: qty 2

## 2016-05-08 MED ORDER — ONDANSETRON HCL 4 MG/2ML IJ SOLN
INTRAMUSCULAR | Status: DC | PRN
  Administered 2016-05-08: 4 mg via INTRAVENOUS

## 2016-05-08 MED ORDER — LIDOCAINE HCL 2 % EX GEL
CUTANEOUS | Status: AC
  Filled 2016-05-08: qty 5

## 2016-05-08 MED ORDER — DIPHENHYDRAMINE HCL 50 MG/ML IJ SOLN
50.0000 mg | Freq: Once | INTRAMUSCULAR | Status: AC
  Administered 2016-05-08: 50 mg via INTRAVENOUS

## 2016-05-08 MED ORDER — FENTANYL CITRATE (PF) 100 MCG/2ML IJ SOLN: 25.0000 ug | INTRAMUSCULAR | Status: DC | PRN

## 2016-05-08 MED ORDER — PROPOFOL 10 MG/ML IV BOLUS
INTRAVENOUS | Status: AC
  Filled 2016-05-08: qty 20

## 2016-05-08 MED ORDER — ONDANSETRON HCL 4 MG/2ML IJ SOLN
INTRAMUSCULAR | Status: AC
  Filled 2016-05-08: qty 2

## 2016-05-08 MED ORDER — FENTANYL CITRATE (PF) 100 MCG/2ML IJ SOLN
INTRAMUSCULAR | Status: DC | PRN
  Administered 2016-05-08: 25 ug via INTRAVENOUS

## 2016-05-08 MED ORDER — LIDOCAINE HCL (CARDIAC) 20 MG/ML IV SOLN
INTRAVENOUS | Status: DC | PRN
  Administered 2016-05-08: 40 mg via INTRAVENOUS

## 2016-05-08 MED ORDER — SUCCINYLCHOLINE CHLORIDE 20 MG/ML IJ SOLN
INTRAMUSCULAR | Status: AC
  Filled 2016-05-08: qty 1

## 2016-05-08 MED ORDER — QUETIAPINE FUMARATE 25 MG PO TABS
25.0000 mg | ORAL_TABLET | Freq: Every day | ORAL | Status: DC
  Administered 2016-05-08 – 2016-05-13 (×6): 25 mg via ORAL
  Filled 2016-05-08 (×6): qty 1

## 2016-05-08 MED ORDER — LORAZEPAM 0.5 MG PO TABS
0.5000 mg | ORAL_TABLET | Freq: Every day | ORAL | Status: DC
  Administered 2016-05-08 – 2016-05-13 (×6): 0.5 mg via ORAL
  Filled 2016-05-08 (×6): qty 1

## 2016-05-08 MED ORDER — SUCCINYLCHOLINE CHLORIDE 20 MG/ML IJ SOLN
INTRAMUSCULAR | Status: DC | PRN
  Administered 2016-05-08: 80 mg via INTRAVENOUS

## 2016-05-08 NOTE — Op Note (Signed)
[facility] Patient Name: Kerri Carter Procedure Date: 05/08/2016 1:34 PM MRN: [id number] Account #: [id number] Date of Birth: 1940-07-20 Admit Type: Inpatient Age: 76 Room: [facility] ENDO ROOM 4 Gender: Female Note Status: Finalized Procedure:            ERCP Indications:          Bile duct stone(s) Providers:            Lucilla Lame MD, MD Referring MD:         Barnie Del. Lacinda Axon MD, MD (Referring MD) Medicines:            General Anesthesia Complications:        No immediate complications. Procedure:            Pre-Anesthesia Assessment:                       - Prior to the procedure, a History and Physical was                        performed, and patient medications and allergies were                        reviewed. The patient's tolerance of previous                        anesthesia was also reviewed. The risks and benefits of                        the procedure and the sedation options and risks were                        discussed with the patient. All questions were                        answered, and informed consent was obtained. Prior                        Anticoagulants: The patient has taken no previous                        anticoagulant or antiplatelet agents. ASA Grade                        Assessment: II - A patient with mild systemic disease.                        After reviewing the risks and benefits, the patient was                        deemed in satisfactory condition to undergo the                        procedure.                       After obtaining informed consent, the scope was passed                        under direct vision. Throughout the procedure, the  patient's blood pressure, pulse, and oxygen saturations                        were monitored continuously. The ERCP was introduced                        through the mouth, and used to inject contrast into and                        used to  inject contrast into the bile duct. The ERCP                        was accomplished without difficulty. The patient                        tolerated the procedure well. Findings:      The scout film was normal. The major papilla was adjacent to a       diverticulum. The major papilla was bulging. The bile duct was deeply       cannulated with the short-nosed traction sphincterotome. Contrast was       injected. I personally interpreted the bile duct images. There was brisk       flow of contrast through the ducts. Image quality was excellent.       Contrast extended to the entire biliary tree. The lower third of the       main bile duct contained two stones mm. A wire was passed into the       biliary tree. A 6 mm biliary sphincterotomy was made with a traction       (standard) sphincterotome using ERBE electrocautery. The sphincterotomy       oozed blood. The biliary tree was swept with a 15 mm balloon starting at       the bifurcation. Sludge was swept from the duct. Two stones were       removed. No stones remained. Impression:           - The major papilla was adjacent to a diverticulum.                       - The major papilla appeared to be bulging.                       - Choledocholithiasis was found. Complete removal was                        accomplished by biliary sphincterotomy and balloon                        extraction.                       - A biliary sphincterotomy was performed.                       - The biliary tree was swept. Recommendation:       - Return patient to hospital ward for ongoing care.                       - Clear liquid diet. Procedure Code(s):    --- Professional ---  [phone], Endoscopic retrograde cholangiopancreatography                        (ERCP); with removal of calculi/debris from                        biliary/pancreatic duct(s)                       [id number], Endoscopic retrograde cholangiopancreatography                         (ERCP); with sphincterotomy/papillotomy                       [phone], Endoscopic catheterization of the biliary ductal                        system, radiological supervision and interpretation Diagnosis Code(s):    --- Professional ---                       K80.50, Calculus of bile duct without cholangitis or                        cholecystitis without obstruction CPT copyright 2016 American Medical Association. All rights reserved. The codes documented in this report are preliminary and upon coder review may  be revised to meet current compliance requirements. Lucilla Lame MD, MD 05/08/2016 2:08:54 PM This report has been signed electronically. Number of Addenda: 0 Note Initiated On: 05/08/2016 1:34 PM      [facility]

## 2016-05-08 NOTE — Anesthesia Preprocedure Evaluation (Addendum)
Anesthesia Evaluation  Patient identified by MRN, date of birth, ID band Patient awake    Reviewed: Allergy & Precautions, H&P , NPO status , Patient's Chart, lab work & pertinent test results, reviewed documented beta blocker date and time   History of Anesthesia Complications Negative for: history of anesthetic complications  Airway Mallampati: III  TM Distance: >3 FB Neck ROM: full    Dental  (+) Edentulous Upper, Edentulous Lower   Pulmonary shortness of breath and with exertion, asthma , sleep apnea and Continuous Positive Airway Pressure Ventilation , COPD,  oxygen dependent, neg recent URI, former smoker,           Cardiovascular Exercise Tolerance: Good hypertension, (-) angina+ CAD  (-) Past MI, (-) Cardiac Stents and (-) CABG negative cardio ROS  (-) dysrhythmias + Valvular Problems/Murmurs      Neuro/Psych PSYCHIATRIC DISORDERS (Depression and anxiety) negative neurological ROS     GI/Hepatic Neg liver ROS, PUD, GERD  ,  Endo/Other  diabetes  Renal/GU CRFRenal disease  negative genitourinary   Musculoskeletal   Abdominal   Peds  Hematology negative hematology ROS (+)   Anesthesia Other Findings Past Medical History: No date: Anxiety No date: Asthma No date: Chicken pox No date: CKD (chronic kidney disease), stage III     Comment: a. s/p R nephrectomy. No date: Conversion disorder No date: COPD (chronic obstructive pulmonary disease) (* No date: Depression No date: Diabetes mellitus without complication (HCC)     Comment: Patient denies Diabetes, does not take any               diabetic medications No date: Essential hypertension No date: GERD (gastroesophageal reflux disease) No date: Hyperlipidemia No date: Inflammatory arthritis     Comment: a. hands/carpal tunnel.  b. Low titer               rheumatoid factor. c. Negative anti-CCP               antibodies. d. Plaquenil. No date:  Non-Obstructive CAD     Comment: a. 07/2009 Cath ([facility]): nonobs dzs;  b. 03/2011               [facility]): nonobs dzs. No date: Osteoarthritis     Comment: a. Knees. No date: PUD (peptic ulcer disease) No date: Toxic maculopathy No date: Valvular heart disease     Comment: a. 07/2015 Echo: EF 55-60%, Mild AI, AS, MR,               and TR.   Reproductive/Obstetrics negative OB ROS                             Anesthesia Physical Anesthesia Plan  ASA: III  Anesthesia Plan: General ETT   Post-op Pain Management:    Induction:   Airway Management Planned:   Additional Equipment:   Intra-op Plan:   Post-operative Plan:   Informed Consent: I have reviewed the patients History and Physical, chart, labs and discussed the procedure including the risks, benefits and alternatives for the proposed anesthesia with the patient or authorized representative who has indicated his/her understanding and acceptance.   Dental Advisory Given  Plan Discussed with: Anesthesiologist, CRNA and Surgeon  Anesthesia Plan Comments:        Anesthesia Quick Evaluation

## 2016-05-08 NOTE — Progress Notes (Signed)
[place] [facility] [place] NAME: Kerri Carter    MR#:  [id number]  DATE OF BIRTH:  12-24-1940  SUBJECTIVE:  Came in with significant right upper quadrant abdominal pain and found to have elevated lipase and bilirubin. Patient is being admitted for gallstone pancreatitis. Patient stays pain much improved husband in the room REVIEW OF SYSTEMS:   Review of Systems  Constitutional: Negative for chills, fever and weight loss.  HENT: Negative for ear discharge, ear pain and nosebleeds.   Eyes: Negative for blurred vision, pain and discharge.  Respiratory: Negative for sputum production, shortness of breath, wheezing and stridor.   Cardiovascular: Negative for chest pain, palpitations, orthopnea and PND.  Gastrointestinal: Positive for abdominal pain. Negative for diarrhea, nausea and vomiting.  Genitourinary: Negative for frequency and urgency.  Musculoskeletal: Negative for back pain and joint pain.  Neurological: Negative for sensory change, speech change, focal weakness and weakness.  Psychiatric/Behavioral: Negative for depression and hallucinations. The patient is not nervous/anxious.    Tolerating Diet:npo Tolerating PT: pending  DRUG ALLERGIES:   Allergies  Allergen Reactions  . Ceftin [Cefuroxime Axetil] Anaphylaxis    Other reaction(s): Other (See Comments) REACTION: tongue and throat swell Other Reaction: TONGUE AND THROAT SWELLING REACTION: tongue and throat swell  . Lisinopril Anaphylaxis    REACTION: tongue and throat swelling (onset 10-10-09)  . Antihistamines, Chlorpheniramine-Type     REACTION: makes pt hyper  . Antivert [Meclizine Hcl]     Other reaction(s): Unknown REACTION: bladder will not empty REACTION: bladder will not empty  . Decongestant [Pseudoephedrine Hcl]     Other reaction(s): Unknown REACTION: makes pt hyper REACTION: makes pt hyper  . Doxycycline     REACTION: GI upset  . Morphine And Related Other  (See Comments)    Per patient, low blood pressure issues, following morphine, that requires action to raise it back up.  . Polymyxin B     Medication was in eye drops.  . Sulfa Antibiotics     Face swelling  . Xarelto [Rivaroxaban]     Other reaction(s): Other (See Comments) GI bleed Stomach burning, bleeding, and tar in stool GI bleed  . Adhesive [Tape] Rash  . Iodine Hives and Rash  . Levaquin [Levofloxacin In D5w] Rash  . Tetanus Toxoids     REACTION: rash, fever, hot to touch at injection site    VITALS:  Blood pressure (!) 125/42, pulse 71, temperature 99.3 F (37.4 C), temperature source Oral, resp. rate 20, height 5\' 4"  (1.626 m), weight 75 kg (165 lb 6.4 oz), SpO2 94 %.  PHYSICAL EXAMINATION:   Physical Exam  GENERAL:  76 y.o.-year-old patient lying in the bed with no acute distress. obese EYES: Pupils equal, round, reactive to light and accommodation. No scleral icterus. Extraocular muscles intact.  HEENT: Head atraumatic, normocephalic. Oropharynx and nasopharynx clear.  NECK:  Supple, no jugular venous distention. No thyroid enlargement, no tenderness.  LUNGS: Normal breath sounds bilaterally, no wheezing, rales, rhonchi. No use of accessory muscles of respiration.  CARDIOVASCULAR: S1, S2 normal. No murmurs, rubs, or gallops.  ABDOMEN: Soft, nontender, nondistended. Bowel sounds present. No organomegaly or mass.  EXTREMITIES: No cyanosis, clubbing or edema b/l.    NEUROLOGIC: Cranial nerves II through XII are intact. No focal Motor or sensory deficits b/l.   PSYCHIATRIC:  patient is alert and oriented x 3.  SKIN: No obvious rash, lesion, or ulcer.   LABORATORY PANEL:  CBC  Recent Labs Lab  05/07/16 0524  WBC 11.7*  HGB 11.2*  HCT 33.3*  PLT 238    Chemistries   Recent Labs Lab 05/07/16 0524  NA 137  K 3.8  CL 103  CO2 27  GLUCOSE 189*  BUN 22*  CREATININE 1.42*  CALCIUM 8.6*  AST 96*  ALT 82*  ALKPHOS 470*  BILITOT 3.2*   Cardiac  Enzymes  Recent Labs Lab 05/07/16 0524  TROPONINI <0.03   RADIOLOGY:  Mr 3d Recon At Scanner  Result Date: 05/07/2016 CLINICAL DATA:  Right upper quadrant abdominal pain. Elevated lipase and bilirubin EXAM: MRI ABDOMEN WITHOUT AND WITH CONTRAST (INCLUDING MRCP) TECHNIQUE: Multiplanar multisequence MR imaging of the abdomen was performed both before and after the administration of intravenous contrast. Heavily T2-weighted images of the biliary and pancreatic ducts were obtained, and three-dimensional MRCP images were rendered by post processing. CONTRAST:  53mL MULTIHANCE GADOBENATE DIMEGLUMINE 529 MG/ML IV SOLN COMPARISON:  CT from 02/25/2016 FINDINGS: Lower chest: Small pleural effusions are identified bilaterally. Hepatobiliary: Mild hepatic steatosis. No enhancing liver abnormalities identified. There are small stones identified within the gallbladder fundus measuring up to 5 mm. The common bile duct is increased in caliber and there are 2 stones identified distally measuring up to 6 mm. The common bile duct has a maximum diameter of 1 cm, image 25 of series 8. Pancreas: No mass, inflammatory changes, or other parenchymal abnormality identified. Spleen:  Within normal limits in size and appearance. Adrenals/Urinary Tract: Normal appearance of the adrenal glands. Solitary left kidney. Status post right nephrectomy. No left kidney mass or hydronephrosis. Stomach/Bowel: The stomach is normal. The small bowel loops scratch set the visualized upper abdominal bowel loops appear normal. Vascular/Lymphatic: Aortic atherosclerosis. No aneurysm. No upper abdominal adenopathy identified. Other:  None. Musculoskeletal: No suspicious bone lesions identified. IMPRESSION: 1. Gallstones and choledocholithiasis. There are 2 stones within the distal common bile duct which measure up to 6 mm. 2. Mild hepatic steatosis. 3. No complications of pancreatitis identified. Electronically Signed   By: Kerby Moors M.D.   On:  05/07/2016 16:44   Mr Abdomen Mrcp Moise Boring Contast  Result Date: 05/07/2016 CLINICAL DATA:  Right upper quadrant abdominal pain. Elevated lipase and bilirubin EXAM: MRI ABDOMEN WITHOUT AND WITH CONTRAST (INCLUDING MRCP) TECHNIQUE: Multiplanar multisequence MR imaging of the abdomen was performed both before and after the administration of intravenous contrast. Heavily T2-weighted images of the biliary and pancreatic ducts were obtained, and three-dimensional MRCP images were rendered by post processing. CONTRAST:  70mL MULTIHANCE GADOBENATE DIMEGLUMINE 529 MG/ML IV SOLN COMPARISON:  CT from 02/25/2016 FINDINGS: Lower chest: Small pleural effusions are identified bilaterally. Hepatobiliary: Mild hepatic steatosis. No enhancing liver abnormalities identified. There are small stones identified within the gallbladder fundus measuring up to 5 mm. The common bile duct is increased in caliber and there are 2 stones identified distally measuring up to 6 mm. The common bile duct has a maximum diameter of 1 cm, image 25 of series 8. Pancreas: No mass, inflammatory changes, or other parenchymal abnormality identified. Spleen:  Within normal limits in size and appearance. Adrenals/Urinary Tract: Normal appearance of the adrenal glands. Solitary left kidney. Status post right nephrectomy. No left kidney mass or hydronephrosis. Stomach/Bowel: The stomach is normal. The small bowel loops scratch set the visualized upper abdominal bowel loops appear normal. Vascular/Lymphatic: Aortic atherosclerosis. No aneurysm. No upper abdominal adenopathy identified. Other:  None. Musculoskeletal: No suspicious bone lesions identified. IMPRESSION: 1. Gallstones and choledocholithiasis. There are 2 stones within the distal  common bile duct which measure up to 6 mm. 2. Mild hepatic steatosis. 3. No complications of pancreatitis identified. Electronically Signed   By: Kerby Moors M.D.   On: 05/07/2016 16:44   US Abdomen Limited Ruq  Result  Date: 05/07/2016 CLINICAL DATA:  Right upper quadrant pain and vomiting. Onset 11 hours ago. EXAM: US ABDOMEN LIMITED - RIGHT UPPER QUADRANT COMPARISON:  CT 02/25/2016 FINDINGS: Gallbladder: Multiple calculi are present, measuring up to 11 mm. Borderline gallbladder wall thickness, 3.8 mm. No pericholecystic fluid. No tenderness over the gallbladder during the exam, but the patient had been medicated. Common bile duct: Diameter: 7 mm. Liver: Mild coarsening of hepatic parenchymal echotexture without focal lesion. IMPRESSION: There are mild study limitations due to bowel gas. There is cholelithiasis and borderline gallbladder wall thickness. Mildly coarsened hepatic parenchymal echotexture may represent fatty infiltration, but is nonspecific. Electronically Signed   By: Andreas Newport M.D.   On: 05/07/2016 06:58   ASSESSMENT AND PLAN:  76 year old female admitted for gallstone pancreatitis.  1. Gallstone pancreatitis/ acute choledocholithiasis -GI consult noted---ERCP today -MRCP shows CDB dilatation with stones -Surgical consultation-noted. Hopefully will get CCK done  2. Chronic kidney disease: Stage III; hydrate with intravenous fluid. Avoid nephrotoxic agents  3. Hypertension: Controlled; continue nebivolol  4. COPD: Stable; continue inhaled corticosteroid as well as albuterol. Add Spiriva  5. Rheumatoid arthritis: Continue methotrexate postoperatively  6. Lipidemia: Continue statin therapy  7. Lower extremity edema: Trace; secondary to venous insufficiency as well as mild valvular heart disease. Continue Lasix per home regimen  8. Psychiatric issues: Continue Lamictal and Seroquel  9. DVT prophylaxis: SCDs  10. GI prophylaxis: Pantoprazole per home regimen  Case discussed with Care Management/Social Worker. Management plans discussed with the patient, family and they are in agreement.  CODE STATUS: full   TOTAL TIME TAKING CARE OF THIS PATIENT: 30 minutes.  >50% time spent on  counselling and coordination of care  POSSIBLE D/C IN 1-2 DAYS, DEPENDING ON CLINICAL CONDITION.  Note: This dictation was prepared with Dragon dictation along with smaller phrase technology. Any transcriptional errors that result from this process are unintentional.  Verlinda Slotnick M.D on 05/08/2016 at 9:39 AM  Between 7am to 6pm - Pager - [phone]  After 6pm go to [email or web address] - password [email or web address] [facility]  Office  [phone]  CC: Primary care physician; Coral Spikes, DO

## 2016-05-08 NOTE — Anesthesia Procedure Notes (Signed)
Procedure Name: Intubation Date/Time: 05/08/2016 1:41 PM Performed by: Darlyne Russian Pre-anesthesia Checklist: Patient identified, Emergency Drugs available, Suction available, Patient being monitored and Timeout performed Patient Re-evaluated:Patient Re-evaluated prior to inductionOxygen Delivery Method: Circle system utilized Preoxygenation: Pre-oxygenation with 100% oxygen Intubation Type: IV induction Ventilation: Mask ventilation without difficulty Laryngoscope Size: Mac and 3 Grade View: Grade I Tube type: Oral Tube size: 7.0 mm Number of attempts: 1 Airway Equipment and Method: Stylet Placement Confirmation: ETT inserted through vocal cords under direct vision,  positive ETCO2 and breath sounds checked- equal and bilateral Secured at: 21 cm Tube secured with: Tape Dental Injury: Teeth and Oropharynx as per pre-operative assessment

## 2016-05-08 NOTE — Anesthesia Post-op Follow-up Note (Cosign Needed)
Anesthesia QCDR form completed.        

## 2016-05-08 NOTE — Transfer of Care (Signed)
Immediate Anesthesia Transfer of Care Note  Patient: Kerri Carter  Procedure(s) Performed: Procedure(s): ENDOSCOPIC RETROGRADE CHOLANGIOPANCREATOGRAPHY (ERCP) WITH PROPOFOL (N/A)  Patient Location: PACU  Anesthesia Type:General  Level of Consciousness: awake and patient cooperative  Airway & Oxygen Therapy: Patient Spontanous Breathing and Patient connected to nasal cannula oxygen  Post-op Assessment: Report given to RN and Post -op Vital signs reviewed and stable  Post vital signs: Reviewed and stable  Last Vitals:  Vitals:   05/08/16 1253 05/08/16 1421  BP: (!) 126/55 (!) 144/59  Pulse: 97 70  Resp:  17  Temp: 36.9 C 37.3 C    Last Pain:  Vitals:   05/08/16 1421  TempSrc: Tympanic  PainSc: 0-No pain         Complications: No apparent anesthesia complications

## 2016-05-09 LAB — CBC
HCT: 31.8 % — ABNORMAL LOW (ref 35.0–47.0)
Hemoglobin: 10.5 g/dL — ABNORMAL LOW (ref 12.0–16.0)
MCH: 29.6 pg (ref 26.0–34.0)
MCHC: 32.9 g/dL (ref 32.0–36.0)
MCV: 90 fL (ref 80.0–100.0)
Platelets: 211 10*3/uL (ref 150–440)
RBC: 3.53 MIL/uL — ABNORMAL LOW (ref 3.80–5.20)
RDW: 19 % — ABNORMAL HIGH (ref 11.5–14.5)
WBC: 7.3 10*3/uL (ref 3.6–11.0)

## 2016-05-09 LAB — GLUCOSE, CAPILLARY
Glucose-Capillary: 83 mg/dL (ref 65–99)
Glucose-Capillary: 118 mg/dL — ABNORMAL HIGH (ref 65–99)
Glucose-Capillary: 78 mg/dL (ref 65–99)
Glucose-Capillary: 108 mg/dL — ABNORMAL HIGH (ref 65–99)

## 2016-05-09 LAB — COMPREHENSIVE METABOLIC PANEL
ALT: 49 U/L (ref 14–54)
AST: 45 U/L — ABNORMAL HIGH (ref 15–41)
Albumin: 2.6 g/dL — ABNORMAL LOW (ref 3.5–5.0)
Alkaline Phosphatase: 375 U/L — ABNORMAL HIGH (ref 38–126)
Anion gap: 4 — ABNORMAL LOW (ref 5–15)
BUN: 14 mg/dL (ref 6–20)
CO2: 26 mmol/L (ref 22–32)
Calcium: 7.6 mg/dL — ABNORMAL LOW (ref 8.9–10.3)
Chloride: 112 mmol/L — ABNORMAL HIGH (ref 101–111)
Creatinine, Ser: 1.21 mg/dL — ABNORMAL HIGH (ref 0.44–1.00)
GFR calc Af Amer: 50 mL/min — ABNORMAL LOW (ref >60)
GFR calc non Af Amer: 43 mL/min — ABNORMAL LOW (ref >60)
Glucose, Bld: 89 mg/dL (ref 65–99)
Potassium: 3.6 mmol/L (ref 3.5–5.1)
Sodium: 142 mmol/L (ref 135–145)
Total Bilirubin: 2 mg/dL — ABNORMAL HIGH (ref 0.3–1.2)
Total Protein: 5.7 g/dL — ABNORMAL LOW (ref 6.5–8.1)

## 2016-05-09 MED ORDER — TIOTROPIUM BROMIDE MONOHYDRATE 18 MCG IN CAPS
18.0000 ug | ORAL_CAPSULE | Freq: Every day | RESPIRATORY_TRACT | Status: DC
  Filled 2016-05-09: qty 5

## 2016-05-09 MED ORDER — ENOXAPARIN SODIUM 40 MG/0.4ML ~~LOC~~ SOLN
40.0000 mg | SUBCUTANEOUS | Status: DC
  Administered 2016-05-09 – 2016-05-13 (×5): 40 mg via SUBCUTANEOUS
  Filled 2016-05-09 (×5): qty 0.4

## 2016-05-09 MED ORDER — MONTELUKAST SODIUM 10 MG PO TABS
10.0000 mg | ORAL_TABLET | Freq: Every day | ORAL | Status: DC
  Administered 2016-05-09 – 2016-05-13 (×5): 10 mg via ORAL
  Filled 2016-05-09 (×5): qty 1

## 2016-05-09 MED ORDER — GABAPENTIN 300 MG PO CAPS
300.0000 mg | ORAL_CAPSULE | Freq: Three times a day (TID) | ORAL | Status: DC
  Administered 2016-05-09 – 2016-05-14 (×15): 300 mg via ORAL
  Filled 2016-05-09 (×16): qty 1

## 2016-05-09 NOTE — Progress Notes (Signed)
KNIYAH KHUN is being followed for choledocholithiasis Day 3 of follow up   Subjective: Patient reports feeling much better, no further pain She is having normal BMs   Objective: Vital signs in last 24 hours: Vitals:   05/08/16 1534 05/08/16 2206 05/09/16 0411 05/09/16 0500  BP: (!) 140/43 (!) 106/40 (!) 118/43   Pulse: 64 (!) 57 61   Resp: 20 17 17    Temp: 97.8 F (36.6 C) 97.7 F (36.5 C) 97.5 F (36.4 C)   TempSrc: Oral Oral Oral   SpO2: 93% 96% 96%   Weight:    76.7 kg (169 lb 3.2 oz)  Height:       Weight change: 1.724 kg (3 lb 12.8 oz)  Intake/Output Summary (Last 24 hours) at 05/09/16 1142 Last data filed at 05/09/16 1045  Gross per 24 hour  Intake          4042.27 ml  Output             1050 ml  Net          2992.27 ml     Exam: Gen: older white female resting comfortably in bed Heart:: RRR Lungs: no respiratory distress Abdomen: +BS, soft, NT, ND Ext: warm   Lab Results: @LABTEST2 @ Micro Results: No results found for this or any previous visit (from the past 240 hour(s)). Studies/Results: Mr 3d Recon At Scanner  Result Date: 05/07/2016 CLINICAL DATA:  Right upper quadrant abdominal pain. Elevated lipase and bilirubin EXAM: MRI ABDOMEN WITHOUT AND WITH CONTRAST (INCLUDING MRCP) TECHNIQUE: Multiplanar multisequence MR imaging of the abdomen was performed both before and after the administration of intravenous contrast. Heavily T2-weighted images of the biliary and pancreatic ducts were obtained, and three-dimensional MRCP images were rendered by post processing. CONTRAST:  8mL MULTIHANCE GADOBENATE DIMEGLUMINE 529 MG/ML IV SOLN COMPARISON:  CT from 02/25/2016 FINDINGS: Lower chest: Small pleural effusions are identified bilaterally. Hepatobiliary: Mild hepatic steatosis. No enhancing liver abnormalities identified. There are small stones identified within the gallbladder fundus measuring up to 5 mm. The common bile duct is increased in caliber and there are  2 stones identified distally measuring up to 6 mm. The common bile duct has a maximum diameter of 1 cm, image 25 of series 8. Pancreas: No mass, inflammatory changes, or other parenchymal abnormality identified. Spleen:  Within normal limits in size and appearance. Adrenals/Urinary Tract: Normal appearance of the adrenal glands. Solitary left kidney. Status post right nephrectomy. No left kidney mass or hydronephrosis. Stomach/Bowel: The stomach is normal. The small bowel loops scratch set the visualized upper abdominal bowel loops appear normal. Vascular/Lymphatic: Aortic atherosclerosis. No aneurysm. No upper abdominal adenopathy identified. Other:  None. Musculoskeletal: No suspicious bone lesions identified. IMPRESSION: 1. Gallstones and choledocholithiasis. There are 2 stones within the distal common bile duct which measure up to 6 mm. 2. Mild hepatic steatosis. 3. No complications of pancreatitis identified. Electronically Signed   By: Kerby Moors M.D.   On: 05/07/2016 16:44   Mr Abdomen Mrcp Moise Boring Contast  Result Date: 05/07/2016 CLINICAL DATA:  Right upper quadrant abdominal pain. Elevated lipase and bilirubin EXAM: MRI ABDOMEN WITHOUT AND WITH CONTRAST (INCLUDING MRCP) TECHNIQUE: Multiplanar multisequence MR imaging of the abdomen was performed both before and after the administration of intravenous contrast. Heavily T2-weighted images of the biliary and pancreatic ducts were obtained, and three-dimensional MRCP images were rendered by post processing. CONTRAST:  75mL MULTIHANCE GADOBENATE DIMEGLUMINE 529 MG/ML IV SOLN COMPARISON:  CT from 02/25/2016 FINDINGS:  Lower chest: Small pleural effusions are identified bilaterally. Hepatobiliary: Mild hepatic steatosis. No enhancing liver abnormalities identified. There are small stones identified within the gallbladder fundus measuring up to 5 mm. The common bile duct is increased in caliber and there are 2 stones identified distally measuring up to 6 mm. The  common bile duct has a maximum diameter of 1 cm, image 25 of series 8. Pancreas: No mass, inflammatory changes, or other parenchymal abnormality identified. Spleen:  Within normal limits in size and appearance. Adrenals/Urinary Tract: Normal appearance of the adrenal glands. Solitary left kidney. Status post right nephrectomy. No left kidney mass or hydronephrosis. Stomach/Bowel: The stomach is normal. The small bowel loops scratch set the visualized upper abdominal bowel loops appear normal. Vascular/Lymphatic: Aortic atherosclerosis. No aneurysm. No upper abdominal adenopathy identified. Other:  None. Musculoskeletal: No suspicious bone lesions identified. IMPRESSION: 1. Gallstones and choledocholithiasis. There are 2 stones within the distal common bile duct which measure up to 6 mm. 2. Mild hepatic steatosis. 3. No complications of pancreatitis identified. Electronically Signed   By: Kerby Moors M.D.   On: 05/07/2016 16:44   Medications: I have reviewed the patient's current medications. Scheduled Meds: . docusate sodium  100 mg Oral BID  . enoxaparin (LOVENOX) injection  40 mg Subcutaneous Q24H  . escitalopram  10 mg Oral Daily  . fluticasone furoate-vilanterol  1 puff Inhalation Daily  . folic acid  1 mg Oral Daily  . lamoTRIgine  100 mg Oral BID  . LORazepam  0.5 mg Oral QHS  . montelukast  10 mg Oral QHS  . nebivolol  5 mg Oral Daily  . pantoprazole  40 mg Oral BID  . QUEtiapine  25 mg Oral QHS  . tiotropium  18 mcg Inhalation Daily   Continuous Infusions: . sodium chloride 50 mL/hr at 05/09/16 1044   PRN Meds:.acetaminophen **OR** acetaminophen, morphine injection, ondansetron **OR** ondansetron (ZOFRAN) IV   Assessment: Active Problems:   Gallstone pancreatitis   Abnormal liver enzymes   RUQ pain   Vomiting   Calculus of gallbladder and bile duct with obstruction without cholecystitis  76 yo F who presented with biliary colic and found to have choledocholithiasis. She is  no POD 1 s/p ERCP with sphincterotomy and stone extraction. She is doing well clinically. Her LFTs are trending downward. She is going for a lap chole on Monday.   Plan:  - no utility in trending lipase - advance diet as tolerated or per surgery's recommendations, no contraindications for a regular diet from a GI perspective - GI will sign off at this time, but please do not hesitate to call back with any questions or concerns   LOS: 2 days   Lisbeth Renshaw 05/09/2016, 11:42 AM

## 2016-05-09 NOTE — Progress Notes (Signed)
AVSS. Ready for food. Lungs: Clear. Cardio: RR. ABD: Soft. Non-tender. Labs: Bili trending down. Plan: Advance to full liquids. Lap chole on Monday.

## 2016-05-09 NOTE — Progress Notes (Signed)
Patient c/o of Neuropathy pain and jaw pain; voices not taking scheduled Gabpentin since admission; Dr.Kalisetti notified and order to start medication; Will continue to monitor

## 2016-05-09 NOTE — Progress Notes (Signed)
[facility] NAME: Marthena Whitmyer    MR#:  [id number]  DATE OF BIRTH:  06/06/1940  SUBJECTIVE:  CHIEF COMPLAINT:   Chief Complaint  Patient presents with  . Abdominal Pain   - Status post ERCP yesterday and CBD stones remote. Feels much better and requesting for regular food. -For laparoscopic cholecystectomy on Monday.  REVIEW OF SYSTEMS:  Review of Systems  Constitutional: Negative for chills, fever and malaise/fatigue.  HENT: Negative for congestion, ear discharge, hearing loss and nosebleeds.   Respiratory: Negative for cough, shortness of breath and wheezing.   Cardiovascular: Negative for chest pain, palpitations and leg swelling.  Gastrointestinal: Negative for abdominal pain, constipation, diarrhea, nausea and vomiting.  Genitourinary: Negative for dysuria.  Musculoskeletal: Negative for myalgias.  Neurological: Negative for dizziness, sensory change, speech change, focal weakness, seizures and headaches.  Psychiatric/Behavioral: Negative for depression.    DRUG ALLERGIES:   Allergies  Allergen Reactions  . Ceftin [Cefuroxime Axetil] Anaphylaxis    Other reaction(s): Other (See Comments) REACTION: tongue and throat swell Other Reaction: TONGUE AND THROAT SWELLING REACTION: tongue and throat swell  . Lisinopril Anaphylaxis    REACTION: tongue and throat swelling (onset 10-10-09)  . Antihistamines, Chlorpheniramine-Type     REACTION: makes pt hyper  . Antivert [Meclizine Hcl]     Other reaction(s): Unknown REACTION: bladder will not empty REACTION: bladder will not empty  . Decongestant [Pseudoephedrine Hcl]     Other reaction(s): Unknown REACTION: makes pt hyper REACTION: makes pt hyper  . Doxycycline     REACTION: GI upset  . Morphine And Related Other (See Comments)    Per patient, low blood pressure issues, following morphine, that requires action to raise it back up.  . Polymyxin B     Medication was in  eye drops.  . Sulfa Antibiotics     Face swelling  . Xarelto [Rivaroxaban]     Other reaction(s): Other (See Comments) GI bleed Stomach burning, bleeding, and tar in stool GI bleed  . Adhesive [Tape] Rash  . Iodine Hives and Rash  . Levaquin [Levofloxacin In D5w] Rash  . Tetanus Toxoids     REACTION: rash, fever, hot to touch at injection site    VITALS:  Blood pressure (!) 118/43, pulse 61, temperature 97.5 F (36.4 C), temperature source Oral, resp. rate 17, height 5\' 4"  (1.626 m), weight 76.7 kg (169 lb 3.2 oz), SpO2 96 %.  PHYSICAL EXAMINATION:  Physical Exam  GENERAL:  76 y.o.-year-old patient lying in the bed with no acute distress.  EYES: Pupils equal, round, reactive to light and accommodation. No scleral icterus. Extraocular muscles intact.  HEENT: Head atraumatic, normocephalic. Oropharynx and nasopharynx clear.  NECK:  Supple, no jugular venous distention. No thyroid enlargement, no tenderness.  LUNGS: Normal breath sounds bilaterally, no wheezing, rales,rhonchi or crepitation. No use of accessory muscles of respiration.  CARDIOVASCULAR: S1, S2 normal. No murmurs, rubs, or gallops.  ABDOMEN: Soft, nontender, nondistended. Bowel sounds present. No organomegaly or mass.  EXTREMITIES: No pedal edema, cyanosis, or clubbing.  NEUROLOGIC: Cranial nerves II through XII are intact. Muscle strength 5/5 in all extremities. Sensation intact. Gait not checked.  PSYCHIATRIC: The patient is alert and oriented x 3.  SKIN: No obvious rash, lesion, or ulcer.    LABORATORY PANEL:   CBC  Recent Labs Lab 05/09/16 0348  WBC 7.3  HGB 10.5*  HCT 31.8*  PLT 211   ------------------------------------------------------------------------------------------------------------------  Chemistries  Recent Labs Lab 05/09/16 0348  NA 142  K 3.6  CL 112*  CO2 26  GLUCOSE 89  BUN 14  CREATININE 1.21*  CALCIUM 7.6*  AST 45*  ALT 49  ALKPHOS 375*  BILITOT 2.0*    ------------------------------------------------------------------------------------------------------------------  Cardiac Enzymes  Recent Labs Lab 05/07/16 0524  TROPONINI <0.03   ------------------------------------------------------------------------------------------------------------------  RADIOLOGY:  Mr 3d Recon At Scanner  Result Date: 05/07/2016 CLINICAL DATA:  Right upper quadrant abdominal pain. Elevated lipase and bilirubin EXAM: MRI ABDOMEN WITHOUT AND WITH CONTRAST (INCLUDING MRCP) TECHNIQUE: Multiplanar multisequence MR imaging of the abdomen was performed both before and after the administration of intravenous contrast. Heavily T2-weighted images of the biliary and pancreatic ducts were obtained, and three-dimensional MRCP images were rendered by post processing. CONTRAST:  76mL MULTIHANCE GADOBENATE DIMEGLUMINE 529 MG/ML IV SOLN COMPARISON:  CT from 02/25/2016 FINDINGS: Lower chest: Small pleural effusions are identified bilaterally. Hepatobiliary: Mild hepatic steatosis. No enhancing liver abnormalities identified. There are small stones identified within the gallbladder fundus measuring up to 5 mm. The common bile duct is increased in caliber and there are 2 stones identified distally measuring up to 6 mm. The common bile duct has a maximum diameter of 1 cm, image 25 of series 8. Pancreas: No mass, inflammatory changes, or other parenchymal abnormality identified. Spleen:  Within normal limits in size and appearance. Adrenals/Urinary Tract: Normal appearance of the adrenal glands. Solitary left kidney. Status post right nephrectomy. No left kidney mass or hydronephrosis. Stomach/Bowel: The stomach is normal. The small bowel loops scratch set the visualized upper abdominal bowel loops appear normal. Vascular/Lymphatic: Aortic atherosclerosis. No aneurysm. No upper abdominal adenopathy identified. Other:  None. Musculoskeletal: No suspicious bone lesions identified. IMPRESSION: 1.  Gallstones and choledocholithiasis. There are 2 stones within the distal common bile duct which measure up to 6 mm. 2. Mild hepatic steatosis. 3. No complications of pancreatitis identified. Electronically Signed   By: Kerby Moors M.D.   On: 05/07/2016 16:44   Mr Abdomen Mrcp Moise Boring Contast  Result Date: 05/07/2016 CLINICAL DATA:  Right upper quadrant abdominal pain. Elevated lipase and bilirubin EXAM: MRI ABDOMEN WITHOUT AND WITH CONTRAST (INCLUDING MRCP) TECHNIQUE: Multiplanar multisequence MR imaging of the abdomen was performed both before and after the administration of intravenous contrast. Heavily T2-weighted images of the biliary and pancreatic ducts were obtained, and three-dimensional MRCP images were rendered by post processing. CONTRAST:  53mL MULTIHANCE GADOBENATE DIMEGLUMINE 529 MG/ML IV SOLN COMPARISON:  CT from 02/25/2016 FINDINGS: Lower chest: Small pleural effusions are identified bilaterally. Hepatobiliary: Mild hepatic steatosis. No enhancing liver abnormalities identified. There are small stones identified within the gallbladder fundus measuring up to 5 mm. The common bile duct is increased in caliber and there are 2 stones identified distally measuring up to 6 mm. The common bile duct has a maximum diameter of 1 cm, image 25 of series 8. Pancreas: No mass, inflammatory changes, or other parenchymal abnormality identified. Spleen:  Within normal limits in size and appearance. Adrenals/Urinary Tract: Normal appearance of the adrenal glands. Solitary left kidney. Status post right nephrectomy. No left kidney mass or hydronephrosis. Stomach/Bowel: The stomach is normal. The small bowel loops scratch set the visualized upper abdominal bowel loops appear normal. Vascular/Lymphatic: Aortic atherosclerosis. No aneurysm. No upper abdominal adenopathy identified. Other:  None. Musculoskeletal: No suspicious bone lesions identified. IMPRESSION: 1. Gallstones and choledocholithiasis. There are 2 stones  within the distal common bile duct which measure up to 6 mm. 2. Mild hepatic steatosis. 3. No  complications of pancreatitis identified. Electronically Signed   By: Kerby Moors M.D.   On: 05/07/2016 16:44    EKG:   Orders placed or performed during the hospital encounter of 05/07/16  . EKG 12-Lead  . EKG 12-Lead    ASSESSMENT AND PLAN:   76 year old female with past medical history significant for hypertension, COPD, rheumatoid arthritis, hyperlipidemia, depression admitted for gallstone pancreatitis.  1. Acute Gallstone pancreatitis/ acute choledocholithiasis -Appreciate surgical and GI consults -MRCP shows CDB dilatation with stones -Status post ERCP yesterday and stones removed. LFTs improving. -For laparoscopic cholecystectomy on Monday. Diet changed to full liquids today. -Clinically stable and no complaints  2. Chronic kidney disease: Stage III; stable renal function. Monitor every day  3. Hypertension: Controlled; continue nebivolol  4. COPD: Stable; continue inhaled corticosteroid as well as albuterol. Added Spiriva  5. Rheumatoid arthritis: Continue methotrexate at discharge  6. Hyperlipidemia: Continue statin therapy  7. Depression: Continue Lamictal and Seroquel  8. DVT prophylaxis-we'll start Lovenox     All the records are reviewed and case discussed with Care Management/Social Workerr. Management plans discussed with the patient, family and they are in agreement.  CODE STATUS:  Full code  TOTAL TIME TAKING CARE OF THIS PATIENT: 33 minutes.   POSSIBLE D/C IN 2 DAYS, DEPENDING ON CLINICAL CONDITION.   Gladstone Lighter M.D on 05/09/2016 at 8:34 AM  Between 7am to 6pm - Pager - [phone]  After 6pm go to [email or web address] - password [email or web address] [facility]  Office  [phone]  CC: Primary care physician; Coral Spikes, DO

## 2016-05-10 LAB — MRSA PCR SCREENING: MRSA by PCR: NEGATIVE

## 2016-05-10 LAB — COMPREHENSIVE METABOLIC PANEL
ALT: 41 U/L (ref 14–54)
AST: 39 U/L (ref 15–41)
Albumin: 3 g/dL — ABNORMAL LOW (ref 3.5–5.0)
Alkaline Phosphatase: 348 U/L — ABNORMAL HIGH (ref 38–126)
Anion gap: 7 (ref 5–15)
BUN: 11 mg/dL (ref 6–20)
CO2: 25 mmol/L (ref 22–32)
Calcium: 8.6 mg/dL — ABNORMAL LOW (ref 8.9–10.3)
Chloride: 109 mmol/L (ref 101–111)
Creatinine, Ser: 1.22 mg/dL — ABNORMAL HIGH (ref 0.44–1.00)
GFR calc Af Amer: 50 mL/min — ABNORMAL LOW (ref >60)
GFR calc non Af Amer: 43 mL/min — ABNORMAL LOW (ref >60)
Glucose, Bld: 96 mg/dL (ref 65–99)
Potassium: 3.5 mmol/L (ref 3.5–5.1)
Sodium: 141 mmol/L (ref 135–145)
Total Bilirubin: 1.2 mg/dL (ref 0.3–1.2)
Total Protein: 6.3 g/dL — ABNORMAL LOW (ref 6.5–8.1)

## 2016-05-10 LAB — GLUCOSE, CAPILLARY
Glucose-Capillary: 89 mg/dL (ref 65–99)
Glucose-Capillary: 96 mg/dL (ref 65–99)
Glucose-Capillary: 95 mg/dL (ref 65–99)
Glucose-Capillary: 115 mg/dL — ABNORMAL HIGH (ref 65–99)

## 2016-05-10 LAB — LIPASE, BLOOD: Lipase: 29 U/L (ref 11–51)

## 2016-05-10 MED ORDER — HYDROMORPHONE HCL 1 MG/ML IJ SOLN
1.0000 mg | INTRAMUSCULAR | Status: AC
  Administered 2016-05-10: 1 mg via INTRAVENOUS
  Filled 2016-05-10: qty 1

## 2016-05-10 MED ORDER — HYDROCODONE-ACETAMINOPHEN 5-325 MG PO TABS
1.0000 | ORAL_TABLET | ORAL | Status: DC | PRN
  Administered 2016-05-10 – 2016-05-14 (×13): 1 via ORAL
  Filled 2016-05-10 (×13): qty 1

## 2016-05-10 NOTE — Progress Notes (Signed)
[facility] NAME: Kerri Carter    MR#:  [id number]  DATE OF BIRTH:  09/05/1940  SUBJECTIVE:  CHIEF COMPLAINT:   Chief Complaint  Patient presents with  . Abdominal Pain   - for lap chole on Monday -Complains of left ear ache and gabapentin restarted last night with minimal effect.  REVIEW OF SYSTEMS:  Review of Systems  Constitutional: Negative for chills, fever and malaise/fatigue.  HENT: Positive for ear pain. Negative for congestion, ear discharge, hearing loss and nosebleeds.   Respiratory: Negative for cough, shortness of breath and wheezing.   Cardiovascular: Negative for chest pain, palpitations and leg swelling.  Gastrointestinal: Positive for abdominal pain. Negative for constipation, diarrhea, nausea and vomiting.  Genitourinary: Negative for dysuria.  Musculoskeletal: Negative for myalgias.  Neurological: Negative for dizziness, sensory change, speech change, focal weakness, seizures and headaches.  Psychiatric/Behavioral: Negative for depression.    DRUG ALLERGIES:   Allergies  Allergen Reactions  . Ceftin [Cefuroxime Axetil] Anaphylaxis    Other reaction(s): Other (See Comments) REACTION: tongue and throat swell Other Reaction: TONGUE AND THROAT SWELLING REACTION: tongue and throat swell  . Lisinopril Anaphylaxis    REACTION: tongue and throat swelling (onset 10-10-09)  . Antihistamines, Chlorpheniramine-Type     REACTION: makes pt hyper  . Antivert [Meclizine Hcl]     Other reaction(s): Unknown REACTION: bladder will not empty REACTION: bladder will not empty  . Decongestant [Pseudoephedrine Hcl]     Other reaction(s): Unknown REACTION: makes pt hyper REACTION: makes pt hyper  . Doxycycline     REACTION: GI upset  . Morphine And Related Other (See Comments)    Per patient, low blood pressure issues, following morphine, that requires action to raise it back up.  . Polymyxin B     Medication was in  eye drops.  . Sulfa Antibiotics     Face swelling  . Xarelto [Rivaroxaban]     Other reaction(s): Other (See Comments) GI bleed Stomach burning, bleeding, and tar in stool GI bleed  . Adhesive [Tape] Rash  . Iodine Hives and Rash  . Levaquin [Levofloxacin In D5w] Rash  . Tetanus Toxoids     REACTION: rash, fever, hot to touch at injection site    VITALS:  Blood pressure (!) 146/50, pulse 63, temperature 98.4 F (36.9 C), temperature source Oral, resp. rate 18, height 5\' 4"  (1.626 m), weight 75.9 kg (167 lb 4.8 oz), SpO2 94 %.  PHYSICAL EXAMINATION:  Physical Exam  GENERAL:  76 y.o.-year-old patient lying in the bed with no acute distress.  EYES: Pupils equal, round, reactive to light and accommodation. No scleral icterus. Extraocular muscles intact.  HEENT: Head atraumatic, normocephalic. Oropharynx and nasopharynx clear. Left ear is clear with normal tympanic membrane. Right ear with lots of wax in the ear canal NECK:  Supple, no jugular venous distention. No thyroid enlargement, no tenderness.  LUNGS: Normal breath sounds bilaterally, no wheezing, rales,rhonchi or crepitation. No use of accessory muscles of respiration.  CARDIOVASCULAR: S1, S2 normal. No murmurs, rubs, or gallops.  ABDOMEN: Soft, nontender, nondistended. Bowel sounds present. No organomegaly or mass.  EXTREMITIES: No pedal edema, cyanosis, or clubbing.  NEUROLOGIC: Cranial nerves II through XII are intact. Muscle strength 5/5 in all extremities. Sensation intact. Gait not checked.  PSYCHIATRIC: The patient is alert and oriented x 3.  SKIN: No obvious rash, lesion, or ulcer.    LABORATORY PANEL:   CBC  Recent Labs Lab 05/09/16  0348  WBC 7.3  HGB 10.5*  HCT 31.8*  PLT 211   ------------------------------------------------------------------------------------------------------------------  Chemistries   Recent Labs Lab 05/10/16 0448  NA 141  K 3.5  CL 109  CO2 25  GLUCOSE 96  BUN 11    CREATININE 1.22*  CALCIUM 8.6*  AST 39  ALT 41  ALKPHOS 348*  BILITOT 1.2   ------------------------------------------------------------------------------------------------------------------  Cardiac Enzymes  Recent Labs Lab 05/07/16 0524  TROPONINI <0.03   ------------------------------------------------------------------------------------------------------------------  RADIOLOGY:  No results found.  EKG:   Orders placed or performed during the hospital encounter of 05/07/16  . EKG 12-Lead  . EKG 12-Lead    ASSESSMENT AND PLAN:   76 year old female with past medical history significant for hypertension, COPD, rheumatoid arthritis, hyperlipidemia, depression admitted for gallstone pancreatitis.  1. Acute Gallstone pancreatitis/ acute choledocholithiasis -Appreciate surgical and GI consults -MRCP shows CDB dilatation with stones -Status post ERCP and stones removed. LFTs improving. -For laparoscopic cholecystectomy on Monday. Tolerating full liquid diet well.  2. Chronic kidney disease: Stage III; stable renal function.  3. Hypertension: Controlled; continue nebivolol  4. COPD: Stable; continue inhaled corticosteroid as well as albuterol. Added Spiriva  5. Rheumatoid arthritis: Continue methotrexate at discharge  6. Left ear ache-neuropathy pain, has been on gabapentin for 5 years for the same. We started last night. One time dose of Dilaudid today. Continue Percocet as needed  7. Depression: Continue Lamictal and Seroquel  8. DVT prophylaxis- Lovenox     All the records are reviewed and case discussed with Care Management/Social Workerr. Management plans discussed with the patient, family and they are in agreement.  CODE STATUS:  Full code  TOTAL TIME TAKING CARE OF THIS PATIENT: 35 minutes.   POSSIBLE D/C IN 2 DAYS, DEPENDING ON CLINICAL CONDITION.   Gladstone Lighter M.D on 05/10/2016 at 9:41 AM  Between 7am to 6pm - Pager -  [phone]  After 6pm go to [email or web address] - password [email or web address] [facility]  Office  [phone]  CC: Primary care physician; Coral Spikes, DO

## 2016-05-10 NOTE — Progress Notes (Signed)
AVSS. Main complaint is left ear pain. Present for five years, managed w/ TID Neurontin (started last PM), (Not mentioned during yesterday's AM visit). Lungs: Clear. Cardio: RR. ABD: Soft. Labs: Normal lipase, LFT's trending down. Plan: Norco for ear pain short term. For lap chole tomorrow. Reviewed laparoscopic procedure, risks, possibility of an open procedure.

## 2016-05-11 ENCOUNTER — Inpatient Hospital Stay: Payer: Medicare Other | Admitting: Registered Nurse

## 2016-05-11 ENCOUNTER — Other Ambulatory Visit: Payer: Self-pay | Admitting: Family Medicine

## 2016-05-11 ENCOUNTER — Inpatient Hospital Stay: Payer: Medicare Other

## 2016-05-11 ENCOUNTER — Encounter
Admission: EM | Disposition: A | Discharge status: Skilled Nursing Facility | Payer: Self-pay | Source: Home / Self Care | Attending: Internal Medicine

## 2016-05-11 ENCOUNTER — Encounter: Payer: Self-pay | Admitting: Gastroenterology

## 2016-05-11 DIAGNOSIS — K851 Biliary acute pancreatitis without necrosis or infection: Secondary | ICD-10-CM

## 2016-05-11 HISTORY — PX: CHOLECYSTECTOMY: SHX55

## 2016-05-11 LAB — GLUCOSE, CAPILLARY
Glucose-Capillary: 91 mg/dL (ref 65–99)
Glucose-Capillary: 86 mg/dL (ref 65–99)
Glucose-Capillary: 122 mg/dL — ABNORMAL HIGH (ref 65–99)
Glucose-Capillary: 125 mg/dL — ABNORMAL HIGH (ref 65–99)
Glucose-Capillary: 218 mg/dL — ABNORMAL HIGH (ref 65–99)

## 2016-05-11 SURGERY — LAPAROSCOPIC CHOLECYSTECTOMY
Anesthesia: General | Wound class: Clean Contaminated

## 2016-05-11 MED ORDER — CLINDAMYCIN PHOSPHATE 600 MG/50ML IV SOLN
600.0000 mg | Freq: Once | INTRAVENOUS | Status: AC
  Administered 2016-05-11: 600 mg via INTRAVENOUS
  Filled 2016-05-11: qty 50

## 2016-05-11 MED ORDER — ACETAMINOPHEN 10 MG/ML IV SOLN
INTRAVENOUS | Status: DC | PRN
  Administered 2016-05-11: 1000 mg via INTRAVENOUS

## 2016-05-11 MED ORDER — FENTANYL CITRATE (PF) 100 MCG/2ML IJ SOLN
INTRAMUSCULAR | Status: DC | PRN
  Administered 2016-05-11: 50 ug via INTRAVENOUS
  Administered 2016-05-11 (×2): 25 ug via INTRAVENOUS

## 2016-05-11 MED ORDER — ONDANSETRON HCL 4 MG/2ML IJ SOLN
INTRAMUSCULAR | Status: DC | PRN
  Administered 2016-05-11: 4 mg via INTRAVENOUS

## 2016-05-11 MED ORDER — PROPOFOL 10 MG/ML IV BOLUS
INTRAVENOUS | Status: DC | PRN
  Administered 2016-05-11: 130 mg via INTRAVENOUS

## 2016-05-11 MED ORDER — LIDOCAINE HCL (PF) 2 % IJ SOLN
INTRAMUSCULAR | Status: AC
  Filled 2016-05-11: qty 2

## 2016-05-11 MED ORDER — ONDANSETRON HCL 4 MG/2ML IJ SOLN: 4.0000 mg | Freq: Once | INTRAMUSCULAR | Status: DC | PRN

## 2016-05-11 MED ORDER — SUGAMMADEX SODIUM 200 MG/2ML IV SOLN
INTRAVENOUS | Status: AC
  Filled 2016-05-11: qty 2

## 2016-05-11 MED ORDER — ROCURONIUM BROMIDE 50 MG/5ML IV SOLN
INTRAVENOUS | Status: AC
  Filled 2016-05-11: qty 1

## 2016-05-11 MED ORDER — MIDAZOLAM HCL 2 MG/2ML IJ SOLN
INTRAMUSCULAR | Status: DC | PRN
  Administered 2016-05-11: 2 mg via INTRAVENOUS

## 2016-05-11 MED ORDER — IOTHALAMATE MEGLUMINE 60 % INJ SOLN
INTRAMUSCULAR | Status: DC | PRN
  Administered 2016-05-11: 10 mL

## 2016-05-11 MED ORDER — ONDANSETRON HCL 4 MG/2ML IJ SOLN
INTRAMUSCULAR | Status: AC
  Filled 2016-05-11: qty 2

## 2016-05-11 MED ORDER — FENTANYL CITRATE (PF) 100 MCG/2ML IJ SOLN
25.0000 ug | INTRAMUSCULAR | Status: DC | PRN
  Administered 2016-05-11 (×4): 25 ug via INTRAVENOUS

## 2016-05-11 MED ORDER — LIDOCAINE HCL (CARDIAC) 20 MG/ML IV SOLN
INTRAVENOUS | Status: DC | PRN
  Administered 2016-05-11: 100 mg via INTRAVENOUS

## 2016-05-11 MED ORDER — FENTANYL CITRATE (PF) 100 MCG/2ML IJ SOLN
INTRAMUSCULAR | Status: AC
  Filled 2016-05-11: qty 2

## 2016-05-11 MED ORDER — BUPIVACAINE-EPINEPHRINE (PF) 0.25% -1:200000 IJ SOLN
INTRAMUSCULAR | Status: AC
  Filled 2016-05-11: qty 30

## 2016-05-11 MED ORDER — DEXAMETHASONE SODIUM PHOSPHATE 10 MG/ML IJ SOLN
INTRAMUSCULAR | Status: AC
  Filled 2016-05-11: qty 1

## 2016-05-11 MED ORDER — SUGAMMADEX SODIUM 200 MG/2ML IV SOLN
INTRAVENOUS | Status: DC | PRN
  Administered 2016-05-11: 200 mg via INTRAVENOUS

## 2016-05-11 MED ORDER — EPHEDRINE SULFATE 50 MG/ML IJ SOLN
INTRAMUSCULAR | Status: DC | PRN
Start: 2016-05-11 — End: 2016-05-11
  Administered 2016-05-11 (×2): 10 mg via INTRAVENOUS

## 2016-05-11 MED ORDER — SEVOFLURANE IN SOLN
RESPIRATORY_TRACT | Status: AC
  Filled 2016-05-11: qty 250

## 2016-05-11 MED ORDER — BUPIVACAINE-EPINEPHRINE (PF) 0.25% -1:200000 IJ SOLN
INTRAMUSCULAR | Status: DC | PRN
Start: 2016-05-11 — End: 2016-05-11
  Administered 2016-05-11: 30 mL via PERINEURAL

## 2016-05-11 MED ORDER — PROPOFOL 10 MG/ML IV BOLUS
INTRAVENOUS | Status: AC
  Filled 2016-05-11: qty 20

## 2016-05-11 MED ORDER — ROCURONIUM BROMIDE 100 MG/10ML IV SOLN
INTRAVENOUS | Status: DC | PRN
  Administered 2016-05-11: 35 mg via INTRAVENOUS

## 2016-05-11 MED ORDER — MIDAZOLAM HCL 2 MG/2ML IJ SOLN
INTRAMUSCULAR | Status: AC
  Filled 2016-05-11: qty 2

## 2016-05-11 MED ORDER — ACETAMINOPHEN 10 MG/ML IV SOLN
INTRAVENOUS | Status: AC
  Filled 2016-05-11: qty 100

## 2016-05-11 MED ORDER — DEXAMETHASONE SODIUM PHOSPHATE 10 MG/ML IJ SOLN
INTRAMUSCULAR | Status: DC | PRN
Start: 2016-05-11 — End: 2016-05-11
  Administered 2016-05-11: 10 mg via INTRAVENOUS

## 2016-05-11 SURGICAL SUPPLY — 45 items
ADHESIVE MASTISOL STRL (MISCELLANEOUS) ×2 IMPLANT
APPLIER CLIP ROT 10 11.4 M/L (STAPLE) ×2
BLADE SURG SZ11 CARB STEEL (BLADE) ×2 IMPLANT
CANISTER SUCT 1200ML W/VALVE (MISCELLANEOUS) ×2 IMPLANT
CATH CHOLANGI 4FR 420404F (CATHETERS) ×2 IMPLANT
CHLORAPREP W/TINT 26ML (MISCELLANEOUS) ×2 IMPLANT
CLIP APPLIE ROT 10 11.4 M/L (STAPLE) ×1 IMPLANT
CONRAY 60ML FOR OR (MISCELLANEOUS) ×2 IMPLANT
DRAPE C-ARM XRAY 36X54 (DRAPES) ×2 IMPLANT
ELECT REM PT RETURN 9FT ADLT (ELECTROSURGICAL) ×2
ELECTRODE REM PT RTRN 9FT ADLT (ELECTROSURGICAL) ×1 IMPLANT
ENDOPOUCH RETRIEVER 10 (MISCELLANEOUS) ×2 IMPLANT
GAUZE SPONGE NON-WVN 2X2 STRL (MISCELLANEOUS) ×4 IMPLANT
GLOVE BIO SURGEON STRL SZ8 (GLOVE) ×8 IMPLANT
GOWN STRL REUS W/ TWL LRG LVL3 (GOWN DISPOSABLE) ×4 IMPLANT
GOWN STRL REUS W/TWL LRG LVL3 (GOWN DISPOSABLE) ×4
IRRIGATION STRYKERFLOW (MISCELLANEOUS) ×1 IMPLANT
IRRIGATOR STRYKERFLOW (MISCELLANEOUS) ×2
IV CATH ANGIO 12GX3 LT BLUE (NEEDLE) ×2 IMPLANT
IV NS 1000ML (IV SOLUTION) ×1
IV NS 1000ML BAXH (IV SOLUTION) ×1 IMPLANT
JACKSON PRATT 10 (INSTRUMENTS) ×2 IMPLANT
KIT RM TURNOVER STRD PROC AR (KITS) ×2 IMPLANT
LABEL OR SOLS (LABEL) ×2 IMPLANT
NDL SAFETY 22GX1.5 (NEEDLE) ×2 IMPLANT
NEEDLE VERESS 14GA 120MM (NEEDLE) ×2 IMPLANT
NS IRRIG 500ML POUR BTL (IV SOLUTION) ×2 IMPLANT
PACK LAP CHOLECYSTECTOMY (MISCELLANEOUS) ×2 IMPLANT
SCISSORS METZENBAUM CVD 33 (INSTRUMENTS) ×2 IMPLANT
SLEEVE ENDOPATH XCEL 5M (ENDOMECHANICALS) ×4 IMPLANT
SPONGE DRAIN TRACH 4X4 STRL 2S (GAUZE/BANDAGES/DRESSINGS) ×2 IMPLANT
SPONGE LAP 18X18 5 PK (GAUZE/BANDAGES/DRESSINGS) ×2 IMPLANT
SPONGE VERSALON 2X2 STRL (MISCELLANEOUS) ×4
SPONGE VERSALON 4X4 4PLY (MISCELLANEOUS) IMPLANT
STRIP CLOSURE SKIN 1/2X4 (GAUZE/BANDAGES/DRESSINGS) ×2 IMPLANT
SUT ETHILON 3-0 FS-10 30 BLK (SUTURE) ×2
SUT MNCRL 4-0 (SUTURE) ×1
SUT MNCRL 4-0 27XMFL (SUTURE) ×1
SUT VICRYL 0 AB UR-6 (SUTURE) ×2 IMPLANT
SUTURE EHLN 3-0 FS-10 30 BLK (SUTURE) ×1 IMPLANT
SUTURE MNCRL 4-0 27XMF (SUTURE) ×1 IMPLANT
SYR 20CC LL (SYRINGE) ×2 IMPLANT
TROCAR XCEL NON-BLD 11X100MML (ENDOMECHANICALS) ×2 IMPLANT
TROCAR XCEL NON-BLD 5MMX100MML (ENDOMECHANICALS) ×2 IMPLANT
TUBING INSUFFLATOR HI FLOW (MISCELLANEOUS) ×2 IMPLANT

## 2016-05-11 NOTE — Progress Notes (Signed)
CC: Biliary pancreatitis Subjective: She feels better today she has almost no pain no nausea vomiting no fevers or chills no jaundice or acholic stools she is ready for surgery  Objective: Vital signs in last 24 hours: Temp:  [97.9 F (36.6 C)-98.5 F (36.9 C)] 97.9 F (36.6 C) (04/09 0517) Pulse Rate:  [65-67] 66 (04/09 0517) Resp:  [16-20] 20 (04/09 0517) BP: (124-154)/(45-91) 154/45 (04/09 0517) SpO2:  [93 %-95 %] 93 % (04/09 0517) Weight:  [170 lb 6.4 oz (77.3 kg)] 170 lb 6.4 oz (77.3 kg) (04/09 0600) Last BM Date: 05/09/16  Intake/Output from previous day: 04/08 0701 - 04/09 0700 In: 2683.3 [P.O.:1610; I.V.:1073.3] Out: 1950 [TZGYF:7494] Intake/Output this shift: Total I/O In: -  Out: 550 [Urine:550]  Physical exam:  No icterus no jaundice Vital signs reviewed and stable Abdomen is soft nontender multiple scars are noted especially right upper quadrant transverse scar extending posteriorly from nephrectomy Calves are nontender  Lab Results: CBC   Recent Labs  05/09/16 0348  WBC 7.3  HGB 10.5*  HCT 31.8*  PLT 211   BMET  Recent Labs  05/09/16 0348 05/10/16 0448  NA 142 141  K 3.6 3.5  CL 112* 109  CO2 26 25  GLUCOSE 89 96  BUN 14 11  CREATININE 1.21* 1.22*  CALCIUM 7.6* 8.6*   PT/INR No results for input(s): LABPROT, INR in the last 72 hours. ABG No results for input(s): PHART, HCO3 in the last 72 hours.  Invalid input(s): PCO2, PO2  Studies/Results: No results found.  Anti-infectives: Anti-infectives    Start     Dose/Rate Route Frequency Ordered Stop   05/07/16 1130  piperacillin-tazobactam (ZOSYN) IVPB 3.375 g  Status:  Discontinued     3.375 g 12.5 mL/hr over 240 Minutes Intravenous Every 8 hours 05/07/16 1015 05/07/16 1537      Assessment/Plan:  Labs personally reviewed. Agree that today the patient should be ready for laparoscopic cholecystectomy with cholangiography for her biliary pancreatitis the rationale for offering this  surgery was reviewed the risks of performing an open procedure due to her nephrectomy scar and possible scar tissue was reviewed with her we also discussed risks of bleeding infection recurrence inability to proceed with a laparoscope and bowel injury versus bile duct injury etc. she understood and agreed to proceed questions were answered for her  Florene Glen, MD, FACS  05/11/2016

## 2016-05-11 NOTE — Progress Notes (Signed)
Preoperative Review   Patient is met in the preoperative holding area. The history is reviewed in the chart and with the patient. I personally reviewed the options and rationale as well as the risks of this procedure that have been previously discussed with the patient. All questions asked by the patient and/or family were answered to their satisfaction.  Patient agrees to proceed with this procedure at this time.  Mikiah Durall E Jonta Gastineau M.D. FACS  

## 2016-05-11 NOTE — Progress Notes (Signed)
Report to Sylvester Harder RN.

## 2016-05-11 NOTE — Progress Notes (Signed)
Per Dr. Ronelle Nigh, [facility] inhaler from the floor given Per order.

## 2016-05-11 NOTE — Anesthesia Post-op Follow-up Note (Cosign Needed)
Anesthesia QCDR form completed.        

## 2016-05-11 NOTE — Anesthesia Preprocedure Evaluation (Signed)
Anesthesia Evaluation  Patient identified by MRN, date of birth, ID band Patient awake    Reviewed: Allergy & Precautions, NPO status , Patient's Chart, lab work & pertinent test results  History of Anesthesia Complications Negative for: history of anesthetic complications  Airway Mallampati: II       Dental  (+) Edentulous Upper, Edentulous Lower   Pulmonary asthma , sleep apnea and Continuous Positive Airway Pressure Ventilation , COPD,  COPD inhaler, former smoker,           Cardiovascular hypertension, Pt. on medications and Pt. on home beta blockers + CAD       Neuro/Psych Anxiety Depression Bipolar Disorder    GI/Hepatic PUD, GERD  Medicated and Controlled,  Endo/Other  Type 2  Renal/GU Renal InsufficiencyRenal disease (single kidney, s/p ballon stenting of renal artery)     Musculoskeletal  (+) Arthritis , Osteoarthritis,    Abdominal   Peds  Hematology  (+) anemia ,   Anesthesia Other Findings   Reproductive/Obstetrics                             Anesthesia Physical Anesthesia Plan  ASA: III  Anesthesia Plan: General   Post-op Pain Management:    Induction: Intravenous  Airway Management Planned: Oral ETT  Additional Equipment:   Intra-op Plan:   Post-operative Plan:   Informed Consent: I have reviewed the patients History and Physical, chart, labs and discussed the procedure including the risks, benefits and alternatives for the proposed anesthesia with the patient or authorized representative who has indicated his/her understanding and acceptance.     Plan Discussed with:   Anesthesia Plan Comments:         Anesthesia Quick Evaluation

## 2016-05-11 NOTE — Transfer of Care (Signed)
Immediate Anesthesia Transfer of Care Note  Patient: Kerri Carter  Procedure(s) Performed: Procedure(s): LAPAROSCOPIC CHOLECYSTECTOMY (N/A)  Patient Location: PACU  Anesthesia Type:General  Level of Consciousness: sedated  Airway & Oxygen Therapy: Patient Spontanous Breathing and Patient connected to face mask oxygen  Post-op Assessment: Report given to RN and Post -op Vital signs reviewed and stable  Post vital signs: Reviewed and stable  Last Vitals:  Vitals:   05/11/16 1252 05/11/16 1608  BP: (!) 164/59 (!) 151/56  Pulse: 64 77  Resp: 20 18  Temp: 36.3 C 44.9 C    Complications: No apparent anesthesia complications

## 2016-05-11 NOTE — Progress Notes (Signed)
[facility] [place] NAME: Kerri Carter    MR#:  [id number]  DATE OF BIRTH:  [birth year]  SUBJECTIVE:  CHIEF COMPLAINT:   Chief Complaint  Patient presents with  . Abdominal Pain   - For cholecystectomy today. Still has some left ear pain but improving  REVIEW OF SYSTEMS:  Review of Systems  Constitutional: Negative for chills, fever and malaise/fatigue.  HENT: Positive for ear pain. Negative for congestion, ear discharge, hearing loss and nosebleeds.   Respiratory: Negative for cough, shortness of breath and wheezing.   Cardiovascular: Negative for chest pain, palpitations and leg swelling.  Gastrointestinal: Negative for abdominal pain, constipation, diarrhea, nausea and vomiting.  Genitourinary: Negative for dysuria.  Musculoskeletal: Negative for myalgias.  Neurological: Negative for dizziness, sensory change, speech change, focal weakness, seizures and headaches.  Psychiatric/Behavioral: Negative for depression.    DRUG ALLERGIES:   Allergies  Allergen Reactions  . Ceftin [Cefuroxime Axetil] Anaphylaxis    Other reaction(s): Other (See Comments) REACTION: tongue and throat swell Other Reaction: TONGUE AND THROAT SWELLING REACTION: tongue and throat swell  . Lisinopril Anaphylaxis    REACTION: tongue and throat swelling (onset 10-10-09)  . Antihistamines, Chlorpheniramine-Type     REACTION: makes pt hyper  . Antivert [Meclizine Hcl]     Other reaction(s): Unknown REACTION: bladder will not empty REACTION: bladder will not empty  . Decongestant [Pseudoephedrine Hcl]     Other reaction(s): Unknown REACTION: makes pt hyper REACTION: makes pt hyper  . Doxycycline     REACTION: GI upset  . Morphine And Related Other (See Comments)    Per patient, low blood pressure issues, following morphine, that requires action to raise it back up.  . Polymyxin B     Medication was in eye drops.  . Sulfa Antibiotics     Face swelling    . Xarelto [Rivaroxaban]     Other reaction(s): Other (See Comments) GI bleed Stomach burning, bleeding, and tar in stool GI bleed  . Adhesive [Tape] Rash  . Iodine Hives and Rash  . Levaquin [Levofloxacin In D5w] Rash  . Tetanus Toxoids     REACTION: rash, fever, hot to touch at injection site    VITALS:  Blood pressure (!) 154/45, pulse 66, temperature 97.9 F (36.6 C), temperature source Oral, resp. rate 20, height 5\' 4"  (1.626 m), weight 77.3 kg (170 lb 6.4 oz), SpO2 93 %.  PHYSICAL EXAMINATION:  Physical Exam  GENERAL:  76 y.o.-year-old patient lying in the bed with no acute distress.  EYES: Pupils equal, round, reactive to light and accommodation. No scleral icterus. Extraocular muscles intact.  HEENT: Head atraumatic, normocephalic. Oropharynx and nasopharynx clear. Left ear is clear with normal tympanic membrane. Right ear with lots of wax in the ear canal NECK:  Supple, no jugular venous distention. No thyroid enlargement, no tenderness.  LUNGS: Normal breath sounds bilaterally, no wheezing, rales,rhonchi or crepitation. No use of accessory muscles of respiration.  CARDIOVASCULAR: S1, S2 normal. No murmurs, rubs, or gallops.  ABDOMEN: Soft, nontender, nondistended. Bowel sounds present. No organomegaly or mass.  EXTREMITIES: No pedal edema, cyanosis, or clubbing.  NEUROLOGIC: Cranial nerves II through XII are intact. Muscle strength 5/5 in all extremities. Sensation intact. Gait not checked.  PSYCHIATRIC: The patient is alert and oriented x 3.  SKIN: No obvious rash, lesion, or ulcer.    LABORATORY PANEL:   CBC  Recent Labs Lab 05/09/16 0348  WBC 7.3  HGB 10.5*  HCT 31.8*  PLT 211   ------------------------------------------------------------------------------------------------------------------  Chemistries   Recent Labs Lab 05/10/16 0448  NA 141  K 3.5  CL 109  CO2 25  GLUCOSE 96  BUN 11  CREATININE 1.22*  CALCIUM 8.6*  AST 39  ALT 41  ALKPHOS  348*  BILITOT 1.2   ------------------------------------------------------------------------------------------------------------------  Cardiac Enzymes  Recent Labs Lab 05/07/16 0524  TROPONINI <0.03   ------------------------------------------------------------------------------------------------------------------  RADIOLOGY:  No results found.  EKG:   Orders placed or performed during the hospital encounter of 05/07/16  . EKG 12-Lead  . EKG 12-Lead    ASSESSMENT AND PLAN:   76 year old female with past medical history significant for hypertension, COPD, rheumatoid arthritis, hyperlipidemia, depression admitted for gallstone pancreatitis.  1. Acute Gallstone pancreatitis/ acute choledocholithiasis -Appreciate surgical and GI consults -MRCP shows CDB dilatation with stones -Status post ERCP and stones removed. LFTs improving. -For laparoscopic cholecystectomy Today..  2. Chronic kidney disease: Stage III; stable renal function.  3. Hypertension: Controlled; continue nebivolol  4. COPD: Stable; continue inhaled corticosteroid as well as albuterol.  5. Rheumatoid arthritis: Continue methotrexate at discharge  6. Left ear ache-neuropathy pain, has been on gabapentin for 5 years for the same. Restarted now.Continue Percocet as needed  7. Depression: Continue Lamictal and Seroquel  8. DVT prophylaxis- Lovenox     All the records are reviewed and case discussed with Care Management/Social Workerr. Management plans discussed with the patient, family and they are in agreement.  CODE STATUS:  Full code  TOTAL TIME TAKING CARE OF THIS PATIENT: 35 minutes.   POSSIBLE D/C Tomorrow, DEPENDING ON CLINICAL CONDITION.   Gladstone Lighter M.D on 05/11/2016 at 10:39 AM  Between 7am to 6pm - Pager - [phone]  After 6pm go to [email or web address] - password [email or web address] [facility]  Office  [phone]  CC: Primary care physician; Coral Spikes,  DO

## 2016-05-11 NOTE — Progress Notes (Signed)
Both inhalers returned to floor, spiriva and breo. Given to charge RN at station with patient name And medical record number on.

## 2016-05-11 NOTE — Op Note (Signed)
Laparoscopic Cholecystectomy  Pre-operative Diagnosis: Biliary pancreatitis  Post-operative Diagnosis: Biliary pancreatitis  Procedure: Laparoscopic cholecystectomy with C-arm fluoroscopic cholangiography  Surgeon: Jerrol Banana. Burt Knack, MD FACS  Anesthesia: Gen. with endotracheal tube  Assistant: Dr. Marta Lamas, [employer] student  Procedure Details  The patient was seen again in the Holding Room. The benefits, complications, treatment options, and expected outcomes were discussed with the patient. The risks of bleeding, infection, recurrence of symptoms, failure to resolve symptoms, bile duct damage, bile duct leak, retained common bile duct stone, bowel injury, any of which could require further surgery and/or ERCP, stent, or papillotomy were reviewed with the patient. We also discussed and emphasized the fact that she has had a right upper quadrant incision for nephrectomy and multiple pelvic procedures that entry into the abdomen or progressing with the laparoscope may be difficult and there may  need to be a change to an open procedure or the risk of bowel injury. The likelihood of improving the patient's symptoms with return to their baseline status is good.  The patient and/or family concurred with the proposed plan, giving informed consent.  The patient was taken to Operating Room, identified as Kerri Carter and the procedure verified as Laparoscopic Cholecystectomy.  A Time Out was held and the above information confirmed.  Prior to the induction of general anesthesia, antibiotic prophylaxis was administered. VTE prophylaxis was in place. General endotracheal anesthesia was then administered and tolerated well. After the induction, the abdomen was prepped with Chloraprep and draped in the sterile fashion. The patient was positioned in the supine position.  Local anesthetic  was injected into the skin near the umbilicus and an incision made. The Veress needle was placed. Pneumoperitoneum was then  created with CO2 and tolerated well without any adverse changes in the patient's vital signs. A 65mm visiport was placed in the periumbilical position and the abdominal cavity was explored. Immediately identify were extensive adhesions where the periumbilical Visiport had been placed. Blunt negotiation with the camera was performed around the adhesions to allow for entry into a relatively free right upper quadrant. Under direct vision,   Two 5-mm ports were placed in the right upper quadrant and a 12 mm epigastric port was placed all under direct vision. All skin incisions  were infiltrated with a local anesthetic agent before making the incision and placing the trocars.  Looking back at the periumbilical site from the right upper quadrant ports as well as the epigastric port there was no sign of bowel injury although there were extensive adhesions in the area. The patient was positioned  in reverse Trendelenburg, tilted slightly to the patient's left.  The gallbladder was identified, the fundus grasped and retracted cephalad. Adhesions were lysed bluntly. The infundibulum was grasped and retracted laterally, exposing the peritoneum overlying the triangle of Calot. This was then divided and exposed in a blunt fashion. A critical view of the cystic duct and cystic artery was obtained.  The cystic duct was clearly identified and bluntly dissected.   The cystic artery was doubly clipped and divided. The cystic duct was clipped and incised and through a separate incision and Angiocath a cholangiogram catheter was placed. C-arm fluoroscopic angiography demonstrated dilated bile duct system but both proximal and distal ducts were identified. The cystic duct had been cannulated. There was good flow into the duodenum. There were no intraluminal filling defects.  The cystic duct catheter was removed and the cystic duct was doubly clipped and divided.  The gallbladder was taken from  the gallbladder fossa in a  retrograde fashion with the electrocautery. The gallbladder was removed and placed in an Endocatch bag. The liver bed was irrigated and inspected. Hemostasis was achieved with the electrocautery. Copious irrigation was utilized and was repeatedly aspirated until clear.  The gallbladder and Endocatch sac were then removed through the epigastric port site.   A 10 mm JP drain was brought in through a lateral port site and placed into the foramen of Winslow and held in with 3-0 nylon.  Inspection of the right upper quadrant was performed. No bleeding, bile duct injury or leak, or bowel injury was noted. Further inspection utilizing the camera and all port sites looking back at the periumbilical site and in the periumbilical catheter there was no sign of bowel injury bile staining or succus. Nothing to suggest a bowel injury was identified. Pneumoperitoneum was released.  The epigastric port site was closed with figure-of-eight 0 Vicryl sutures. 4-0 subcuticular Monocryl was used to close the skin. Steristrips and Mastisol and sterile dressings were  applied.  The patient was then extubated and brought to the recovery room in stable condition. Sponge, lap, and needle counts were correct at closure and at the conclusion of the case.   Findings: Chronic Cholecystitis with normal cholangiogram with the exception of dilatation.  Estimated Blood Loss: 25 cc         Drains: JP 1         Specimens: Gallbladder           Complications: none               Richard E. Burt Knack, MD, FACS

## 2016-05-11 NOTE — Anesthesia Procedure Notes (Signed)
Procedure Name: Intubation Date/Time: 05/11/2016 3:03 PM Performed by: Doreen Salvage Pre-anesthesia Checklist: Patient identified, Patient being monitored, Timeout performed, Emergency Drugs available and Suction available Patient Re-evaluated:Patient Re-evaluated prior to inductionOxygen Delivery Method: Circle system utilized Preoxygenation: Pre-oxygenation with 100% oxygen Intubation Type: IV induction Ventilation: Mask ventilation without difficulty Laryngoscope Size: Mac and 3 Grade View: Grade I Tube type: Oral Tube size: 7.0 mm Number of attempts: 1 Airway Equipment and Method: Stylet Placement Confirmation: ETT inserted through vocal cords under direct vision,  positive ETCO2 and breath sounds checked- equal and bilateral Secured at: 21 cm Tube secured with: Tape Dental Injury: Teeth and Oropharynx as per pre-operative assessment

## 2016-05-12 ENCOUNTER — Inpatient Hospital Stay: Payer: Medicare Other

## 2016-05-12 ENCOUNTER — Encounter: Payer: Self-pay | Admitting: Surgery

## 2016-05-12 LAB — GLUCOSE, CAPILLARY
Glucose-Capillary: 122 mg/dL — ABNORMAL HIGH (ref 65–99)
Glucose-Capillary: 125 mg/dL — ABNORMAL HIGH (ref 65–99)
Glucose-Capillary: 112 mg/dL — ABNORMAL HIGH (ref 65–99)
Glucose-Capillary: 126 mg/dL — ABNORMAL HIGH (ref 65–99)

## 2016-05-12 LAB — CBC
HCT: 32 % — ABNORMAL LOW (ref 35.0–47.0)
Hemoglobin: 10.3 g/dL — ABNORMAL LOW (ref 12.0–16.0)
MCH: 28.8 pg (ref 26.0–34.0)
MCHC: 32.2 g/dL (ref 32.0–36.0)
MCV: 89.3 fL (ref 80.0–100.0)
Platelets: 332 10*3/uL (ref 150–440)
RBC: 3.58 MIL/uL — ABNORMAL LOW (ref 3.80–5.20)
RDW: 18.9 % — ABNORMAL HIGH (ref 11.5–14.5)
WBC: 14.5 10*3/uL — ABNORMAL HIGH (ref 3.6–11.0)

## 2016-05-12 LAB — COMPREHENSIVE METABOLIC PANEL
ALT: 30 U/L (ref 14–54)
AST: 42 U/L — ABNORMAL HIGH (ref 15–41)
Albumin: 2.7 g/dL — ABNORMAL LOW (ref 3.5–5.0)
Alkaline Phosphatase: 275 U/L — ABNORMAL HIGH (ref 38–126)
Anion gap: 8 (ref 5–15)
BUN: 11 mg/dL (ref 6–20)
CO2: 25 mmol/L (ref 22–32)
Calcium: 8.6 mg/dL — ABNORMAL LOW (ref 8.9–10.3)
Chloride: 107 mmol/L (ref 101–111)
Creatinine, Ser: 1.22 mg/dL — ABNORMAL HIGH (ref 0.44–1.00)
GFR calc Af Amer: 50 mL/min — ABNORMAL LOW (ref >60)
GFR calc non Af Amer: 43 mL/min — ABNORMAL LOW (ref >60)
Glucose, Bld: 150 mg/dL — ABNORMAL HIGH (ref 65–99)
Potassium: 4.1 mmol/L (ref 3.5–5.1)
Sodium: 140 mmol/L (ref 135–145)
Total Bilirubin: 0.9 mg/dL (ref 0.3–1.2)
Total Protein: 6 g/dL — ABNORMAL LOW (ref 6.5–8.1)

## 2016-05-12 LAB — LIPASE, BLOOD: Lipase: 19 U/L (ref 11–51)

## 2016-05-12 MED ORDER — DIPHENHYDRAMINE HCL 50 MG/ML IJ SOLN
12.5000 mg | Freq: Once | INTRAMUSCULAR | Status: AC
  Administered 2016-05-12: 12.5 mg via INTRAVENOUS
  Filled 2016-05-12: qty 1

## 2016-05-12 MED ORDER — PREDNISONE 5 MG PO TABS
50.0000 mg | ORAL_TABLET | Freq: Once | ORAL | Status: AC
  Administered 2016-05-12: 50 mg via ORAL
  Filled 2016-05-12: qty 2

## 2016-05-12 MED ORDER — IPRATROPIUM-ALBUTEROL 0.5-2.5 (3) MG/3ML IN SOLN
3.0000 mL | RESPIRATORY_TRACT | Status: DC | PRN
  Administered 2016-05-12 – 2016-05-13 (×3): 3 mL via RESPIRATORY_TRACT
  Filled 2016-05-12 (×3): qty 3

## 2016-05-12 MED ORDER — SENNA 8.6 MG PO TABS
1.0000 | ORAL_TABLET | Freq: Every day | ORAL | Status: DC
  Administered 2016-05-12 – 2016-05-13 (×2): 8.6 mg via ORAL
  Filled 2016-05-12 (×2): qty 1

## 2016-05-12 NOTE — Anesthesia Postprocedure Evaluation (Signed)
Anesthesia Post Note  Patient: Kerri Carter  Procedure(s) Performed: Procedure(s) (LRB): LAPAROSCOPIC CHOLECYSTECTOMY (N/A)  Patient location during evaluation: PACU Anesthesia Type: General Level of consciousness: awake and alert and oriented Pain management: pain level controlled Vital Signs Assessment: post-procedure vital signs reviewed and stable Respiratory status: spontaneous breathing Cardiovascular status: blood pressure returned to baseline Anesthetic complications: no     Last Vitals:  Vitals:   05/11/16 1747 05/11/16 2024  BP: (!) 129/44 138/60  Pulse: 68 72  Resp:  18  Temp:  36.8 C    Last Pain:  Vitals:   05/11/16 2324  TempSrc:   PainSc: Asleep                 Benji Poynter

## 2016-05-12 NOTE — Progress Notes (Signed)
1 Day Post-Op  Subjective: Patient feels much better today following laparoscopic cholecystectomy she does have some pain but is hungry and wants to advance her diet  Objective: Vital signs in last 24 hours: Temp:  [97.4 F (36.3 C)-98.6 F (37 C)] 98.2 F (36.8 C) (04/10 0506) Pulse Rate:  [64-77] 69 (04/10 0506) Resp:  [12-20] 20 (04/10 0506) BP: (119-171)/(41-60) 119/49 (04/10 0506) SpO2:  [90 %-97 %] 92 % (04/10 0506) Weight:  [168 lb 6.4 oz (76.4 kg)] 168 lb 6.4 oz (76.4 kg) (04/10 0500) Last BM Date: 05/11/16  Intake/Output from previous day: 04/09 0701 - 04/10 0700 In: 2696 [P.O.:1000; I.V.:1696] Out: 1810 [Urine:1800; Drains:10] Intake/Output this shift: No intake/output data recorded.  Physical exam:  No icterus no jaundice no bile in drain abdomen soft and minimally tender  Lab Results: CBC   Recent Labs  05/12/16 0524  WBC 14.5*  HGB 10.3*  HCT 32.0*  PLT 332   BMET  Recent Labs  05/10/16 0448 05/12/16 0524  NA 141 140  K 3.5 4.1  CL 109 107  CO2 25 25  GLUCOSE 96 150*  BUN 11 11  CREATININE 1.22* 1.22*  CALCIUM 8.6* 8.6*   PT/INR No results for input(s): LABPROT, INR in the last 72 hours. ABG No results for input(s): PHART, HCO3 in the last 72 hours.  Invalid input(s): PCO2, PO2  Studies/Results: Dg Cholangiogram Operative  Result Date: 05/11/2016 CLINICAL DATA:  Intraoperative cholangiogram during laparoscopic cholecystectomy. EXAM: INTRAOPERATIVE CHOLANGIOGRAM FLUOROSCOPY TIME:  32 seconds COMPARISON:  MRCP - 05/07/2016 FINDINGS: Intraoperative cholangiographic images of the right upper abdominal quadrant during laparoscopic cholecystectomy are provided for review. Surgical clips overlie the expected location of the gallbladder fossa. Contrast injection demonstrates selective cannulation of the central aspect of the cystic duct. There is passage of contrast through the central aspect of the cystic duct with filling of a mildly dilated  common bile duct. There is passage of contrast though the CBD and into the descending portion of the duodenum. There is minimal reflux of injected contrast into the common hepatic duct and central aspect of the non dilated intrahepatic biliary system. There are no discrete filling defects within the opacified portions of the biliary system to suggest the presence of choledocholithiasis. IMPRESSION: No evidence of choledocholithiasis. Electronically Signed   By: Sandi Mariscal M.D.   On: 05/11/2016 15:58    Anti-infectives: Anti-infectives    Start     Dose/Rate Route Frequency Ordered Stop   05/11/16 1300  clindamycin (CLEOCIN) IVPB 600 mg     600 mg 100 mL/hr over 30 Minutes Intravenous  Once 05/11/16 1049 05/11/16 1520   05/07/16 1130  piperacillin-tazobactam (ZOSYN) IVPB 3.375 g  Status:  Discontinued     3.375 g 12.5 mL/hr over 240 Minutes Intravenous Every 8 hours 05/07/16 1015 05/07/16 1537      Assessment/Plan: s/p Procedure(s): LAPAROSCOPIC CHOLECYSTECTOMY   Labs are reviewed no bile in drain patient doing very well will advance diet probably home tomorrow  Florene Glen, MD, FACS  05/12/2016

## 2016-05-12 NOTE — Progress Notes (Signed)
Pt.'s is having redness in her face and the left cheek area is slightly swollen. Pt is not complaining of trouble swallowing or itching around the area. Dr. Tressia Miners was notified of pt. Findings, new orders to give prednisone 50 mg PO once. Will continue to monitor pt.   Broughton Eppinger [employer]

## 2016-05-12 NOTE — Progress Notes (Signed)
[name] NAME: Kerri Carter    MR#:  [id number]  DATE OF BIRTH:  [birth year]  SUBJECTIVE:  CHIEF COMPLAINT:   Chief Complaint  Patient presents with  . Abdominal Pain   - Status post laparoscopic cholecystectomy yesterday. Patient doing well. Has a drain present. -Tolerating regular food now. Hasn't had a bowel movement yet.  REVIEW OF SYSTEMS:  Review of Systems  Constitutional: Negative for chills, fever and malaise/fatigue.  HENT: Negative for congestion, ear discharge, ear pain, hearing loss and nosebleeds.   Respiratory: Negative for cough, shortness of breath and wheezing.   Cardiovascular: Negative for chest pain, palpitations and leg swelling.  Gastrointestinal: Positive for abdominal pain and constipation. Negative for diarrhea, nausea and vomiting.  Genitourinary: Negative for dysuria.  Musculoskeletal: Negative for myalgias.  Neurological: Negative for dizziness, sensory change, speech change, focal weakness, seizures and headaches.  Psychiatric/Behavioral: Negative for depression.    DRUG ALLERGIES:   Allergies  Allergen Reactions  . Ceftin [Cefuroxime Axetil] Anaphylaxis    Other reaction(s): Other (See Comments) REACTION: tongue and throat swell Other Reaction: TONGUE AND THROAT SWELLING REACTION: tongue and throat swell  . Lisinopril Anaphylaxis    REACTION: tongue and throat swelling (onset 10-10-09)  . Antihistamines, Chlorpheniramine-Type     REACTION: makes pt hyper  . Antivert [Meclizine Hcl]     Other reaction(s): Unknown REACTION: bladder will not empty REACTION: bladder will not empty  . Decongestant [Pseudoephedrine Hcl]     Other reaction(s): Unknown REACTION: makes pt hyper REACTION: makes pt hyper  . Doxycycline     REACTION: GI upset  . Morphine And Related Other (See Comments)    Per patient, low blood pressure issues, following morphine, that requires action to raise it back up.  .  Polymyxin B     Medication was in eye drops.  . Sulfa Antibiotics     Face swelling  . Xarelto [Rivaroxaban]     Other reaction(s): Other (See Comments) GI bleed Stomach burning, bleeding, and tar in stool GI bleed  . Adhesive [Tape] Rash  . Iodine Hives and Rash  . Levaquin [Levofloxacin In D5w] Rash  . Tetanus Toxoids     REACTION: rash, fever, hot to touch at injection site    VITALS:  Blood pressure (!) 122/45, pulse 69, temperature 97.9 F (36.6 C), temperature source Oral, resp. rate 17, height 5\' 4"  (1.626 m), weight 76.4 kg (168 lb 6.4 oz), SpO2 91 %.  PHYSICAL EXAMINATION:  Physical Exam  GENERAL:  76 y.o.-year-old patient lying in the bed with no acute distress.  EYES: Pupils equal, round, reactive to light and accommodation. No scleral icterus. Extraocular muscles intact.  HEENT: Head atraumatic, normocephalic. Oropharynx and nasopharynx clear. Some facial flushing noted on the cheeks NECK:  Supple, no jugular venous distention. No thyroid enlargement, no tenderness.  LUNGS: Normal breath sounds bilaterally, no wheezing, rales,rhonchi or crepitation. No use of accessory muscles of respiration.  CARDIOVASCULAR: S1, S2 normal. No murmurs, rubs, or gallops.  ABDOMEN: Soft with some tenderness around the surgical site. Has a drain with bloody fluid in it. Hypoactive Bowel sounds present. No organomegaly or mass.  EXTREMITIES: No pedal edema, cyanosis, or clubbing.  NEUROLOGIC: Cranial nerves II through XII are intact. Muscle strength 5/5 in all extremities. Sensation intact. Gait not checked.  PSYCHIATRIC: The patient is alert and oriented x 3.  SKIN: No obvious rash, lesion, or ulcer.    LABORATORY PANEL:  CBC  Recent Labs Lab 05/12/16 0524  WBC 14.5*  HGB 10.3*  HCT 32.0*  PLT 332   ------------------------------------------------------------------------------------------------------------------  Chemistries   Recent Labs Lab 05/12/16 0524  NA 140  K  4.1  CL 107  CO2 25  GLUCOSE 150*  BUN 11  CREATININE 1.22*  CALCIUM 8.6*  AST 42*  ALT 30  ALKPHOS 275*  BILITOT 0.9   ------------------------------------------------------------------------------------------------------------------  Cardiac Enzymes  Recent Labs Lab 05/07/16 0524  TROPONINI <0.03   ------------------------------------------------------------------------------------------------------------------  RADIOLOGY:  Dg Cholangiogram Operative  Result Date: 05/11/2016 CLINICAL DATA:  Intraoperative cholangiogram during laparoscopic cholecystectomy. EXAM: INTRAOPERATIVE CHOLANGIOGRAM FLUOROSCOPY TIME:  32 seconds COMPARISON:  MRCP - 05/07/2016 FINDINGS: Intraoperative cholangiographic images of the right upper abdominal quadrant during laparoscopic cholecystectomy are provided for review. Surgical clips overlie the expected location of the gallbladder fossa. Contrast injection demonstrates selective cannulation of the central aspect of the cystic duct. There is passage of contrast through the central aspect of the cystic duct with filling of a mildly dilated common bile duct. There is passage of contrast though the CBD and into the descending portion of the duodenum. There is minimal reflux of injected contrast into the common hepatic duct and central aspect of the non dilated intrahepatic biliary system. There are no discrete filling defects within the opacified portions of the biliary system to suggest the presence of choledocholithiasis. IMPRESSION: No evidence of choledocholithiasis. Electronically Signed   By: Sandi Mariscal M.D.   On: 05/11/2016 15:58    EKG:   Orders placed or performed during the hospital encounter of 05/07/16  . EKG 12-Lead  . EKG 12-Lead    ASSESSMENT AND PLAN:   76 year old female with past medical history significant for hypertension, COPD, rheumatoid arthritis, hyperlipidemia, depression admitted for gallstone pancreatitis.  1. Acute  Gallstone pancreatitis/ acute choledocholithiasis -Appreciate surgical and GI consults -MRCP shows CDB dilatation with stones -Status post ERCP and stones removed. LFTs improved. -Status post laparoscopic cholecystectomy on 05/21/2016. Doing well. Tolerating regular diet. -Still has a drain present. Anticipate discharge tomorrow. Will need a bowel movement.  2. Chronic kidney disease: Stage III; stable renal function.  3. Hypertension: Controlled; continue nebivolol  4. COPD: Stable; continue inhaled corticosteroid as well as albuterol.  5. Rheumatoid arthritis: Continue methotrexate at discharge  6. Left ear ache-neuropathy pain, improved with gabapentin now  7. Depression: Continue Lamictal and Seroquel  8. DVT prophylaxis- Lovenox     All the records are reviewed and case discussed with Care Management/Social Workerr. Management plans discussed with the patient, family and they are in agreement.  CODE STATUS:  Full code  TOTAL TIME TAKING CARE OF THIS PATIENT: 36 minutes.   POSSIBLE D/C Tomorrow, DEPENDING ON CLINICAL CONDITION.   Gladstone Lighter M.D on 05/12/2016 at 2:48 PM  Between 7am to 6pm - Pager - [phone]  After 6pm go to [email or web address] - password [email or web address] [facility]  Office  [phone]  CC: Primary care physician; Coral Spikes, DO

## 2016-05-13 ENCOUNTER — Ambulatory Visit: Admit: 2016-05-13 | Payer: Medicare Other | Admitting: Unknown Physician Specialty

## 2016-05-13 LAB — CBC WITH DIFFERENTIAL/PLATELET
Basophils Absolute: 0.1 10*3/uL (ref 0–0.1)
Basophils Relative: 1 %
Eosinophils Absolute: 0 10*3/uL (ref 0–0.7)
Eosinophils Relative: 0 %
HCT: 30.7 % — ABNORMAL LOW (ref 35.0–47.0)
Hemoglobin: 10.2 g/dL — ABNORMAL LOW (ref 12.0–16.0)
Lymphocytes Relative: 10 %
Lymphs Abs: 1.5 10*3/uL (ref 1.0–3.6)
MCH: 30 pg (ref 26.0–34.0)
MCHC: 33.2 g/dL (ref 32.0–36.0)
MCV: 90.5 fL (ref 80.0–100.0)
Monocytes Absolute: 1.1 10*3/uL — ABNORMAL HIGH (ref 0.2–0.9)
Monocytes Relative: 8 %
Neutro Abs: 11.8 10*3/uL — ABNORMAL HIGH (ref 1.4–6.5)
Neutrophils Relative %: 81 %
Platelets: 333 10*3/uL (ref 150–440)
RBC: 3.39 MIL/uL — ABNORMAL LOW (ref 3.80–5.20)
RDW: 19.3 % — ABNORMAL HIGH (ref 11.5–14.5)
WBC: 14.6 10*3/uL — ABNORMAL HIGH (ref 3.6–11.0)

## 2016-05-13 LAB — COMPREHENSIVE METABOLIC PANEL
ALT: 24 U/L (ref 14–54)
AST: 28 U/L (ref 15–41)
Albumin: 2.7 g/dL — ABNORMAL LOW (ref 3.5–5.0)
Alkaline Phosphatase: 229 U/L — ABNORMAL HIGH (ref 38–126)
Anion gap: 7 (ref 5–15)
BUN: 14 mg/dL (ref 6–20)
CO2: 27 mmol/L (ref 22–32)
Calcium: 8.5 mg/dL — ABNORMAL LOW (ref 8.9–10.3)
Chloride: 106 mmol/L (ref 101–111)
Creatinine, Ser: 1.12 mg/dL — ABNORMAL HIGH (ref 0.44–1.00)
GFR calc Af Amer: 55 mL/min — ABNORMAL LOW (ref >60)
GFR calc non Af Amer: 48 mL/min — ABNORMAL LOW (ref >60)
Glucose, Bld: 132 mg/dL — ABNORMAL HIGH (ref 65–99)
Potassium: 4.1 mmol/L (ref 3.5–5.1)
Sodium: 140 mmol/L (ref 135–145)
Total Bilirubin: 0.8 mg/dL (ref 0.3–1.2)
Total Protein: 5.9 g/dL — ABNORMAL LOW (ref 6.5–8.1)

## 2016-05-13 LAB — GLUCOSE, CAPILLARY
Glucose-Capillary: 140 mg/dL — ABNORMAL HIGH (ref 65–99)
Glucose-Capillary: 273 mg/dL — ABNORMAL HIGH (ref 65–99)
Glucose-Capillary: 188 mg/dL — ABNORMAL HIGH (ref 65–99)
Glucose-Capillary: 206 mg/dL — ABNORMAL HIGH (ref 65–99)

## 2016-05-13 LAB — SURGICAL PATHOLOGY

## 2016-05-13 SURGERY — COLONOSCOPY WITH PROPOFOL
Anesthesia: General

## 2016-05-13 MED ORDER — BISACODYL 10 MG RE SUPP
10.0000 mg | Freq: Every day | RECTAL | Status: DC | PRN
  Administered 2016-05-14: 10 mg via RECTAL
  Filled 2016-05-13: qty 1

## 2016-05-13 MED ORDER — POLYETHYLENE GLYCOL 3350 17 G PO PACK
17.0000 g | PACK | Freq: Every day | ORAL | Status: DC | PRN
  Administered 2016-05-13: 17 g via ORAL
  Filled 2016-05-13: qty 1

## 2016-05-13 MED ORDER — FUROSEMIDE 10 MG/ML IJ SOLN
40.0000 mg | Freq: Once | INTRAMUSCULAR | Status: AC
  Administered 2016-05-13: 40 mg via INTRAVENOUS
  Filled 2016-05-13: qty 4

## 2016-05-13 NOTE — Evaluation (Signed)
Physical Therapy Evaluation Patient Details Name: Kerri Carter MRN: [id number] DOB: 1940/08/15 Today's Date: 05/13/2016   History of Present Illness  Pt is a 76 year old female with past medical history significant for hypertension, COPD, rheumatoid arthritis, hyperlipidemia, depression admitted for gallstone pancreatitis.  Pt is s/p ERCP with stones removed and is s/p laparoscopic cholecystectomy.    Clinical Impression  Pt presents with deficits in strength, transfers, mobility, gait, balance, and activity tolerance.  Pt required min A for BLEs in/out of bed during sup to/from sit with log roll education/training provided.  Pt reported very little abdominal pain using log roll technique for bed mobility tasks.  Pt required CGA with transfers from elevated EOB.  Pt only able to amb 8' with RW and CGA before requiring to return to sitting.  Very slow cadence with gait with SpO2 and HR remaining near baseline levels of 94% and 72 bpm respectively but with moderate SOB noted that resolved after 1-2 min of sitting with cues for breathing technique.  Pt will benefit from rehab services in a SNF setting secondary to extent of above deficits for safe progression towards return to prior level of function.     Follow Up Recommendations SNF    Equipment Recommendations  None recommended by PT    Recommendations for Other Services       Precautions / Restrictions Precautions Precautions: Fall Restrictions Weight Bearing Restrictions: No      Mobility  Bed Mobility Overal bed mobility: Needs Assistance Bed Mobility: Sit to Supine;Supine to Sit     Supine to sit: Min assist Sit to supine: Min assist   General bed mobility comments: Min A for BLEs in/out of bed; log roll technique education/training provided with pt reporting minimal abdominal pain during bed mobility tasks  Transfers Overall transfer level: Needs assistance Equipment used: Rolling walker (2 wheeled) Transfers: Sit  to/from Stand Sit to Stand: Min guard         General transfer comment: Min verbal cues for sequencing  Ambulation/Gait Ambulation/Gait assistance: Min guard Ambulation Distance (Feet): 8 Feet Assistive device: Rolling walker (2 wheeled) Gait Pattern/deviations: Decreased step length - right;Decreased step length - left;Trunk flexed   Gait velocity interpretation: Below normal speed for age/gender General Gait Details: Very slow cadence with gait with SpO2 and HR remaining near baseline levels of 94% and 72 bpm but with moderate SOB noted that resolved after 1-2 min of sitting with cues for breathing technique  Stairs            Wheelchair Mobility    Modified Rankin (Stroke Patients Only)       Balance Overall balance assessment: Needs assistance Sitting-balance support: No upper extremity supported Sitting balance-Leahy Scale: Good     Standing balance support: Bilateral upper extremity supported Standing balance-Leahy Scale: Good                               Pertinent Vitals/Pain Pain Assessment: No/denies pain    Home Living Family/patient expects to be discharged to:: Private residence Living Arrangements: Spouse/significant other;Other relatives Available Help at Discharge: Family;Available 24 hours/day Type of Home: House Home Access: Ramped entrance     Home Layout: One level Home Equipment: Walker - 2 wheels;Walker - 4 wheels;Cane - quad;Cane - single point      Prior Function Level of Independence: Independent         Comments: Ind Amb limited community distances without AD with  no fall history, Ind with ADLs     Hand Dominance   Dominant Hand: Right    Extremity/Trunk Assessment        Lower Extremity Assessment Lower Extremity Assessment: Generalized weakness       Communication   Communication: No difficulties  Cognition Arousal/Alertness: Awake/alert Behavior During Therapy: WFL for tasks  assessed/performed Overall Cognitive Status: Within Functional Limits for tasks assessed                                        General Comments      Exercises Total Joint Exercises Ankle Circles/Pumps: AROM;Both;5 reps;10 reps Quad Sets: Strengthening;Both;5 reps;10 reps Gluteal Sets: Strengthening;Both;5 reps;10 reps Heel Slides: AROM;Both;5 reps Straight Leg Raises: AAROM;Both;5 reps Long Arc Quad: AROM;Both;10 reps Knee Flexion: AROM;Both;10 reps Other Exercises Other Exercises: HEP education/review with BLE APs, QS, and GS x 10 each 5-6x/day   Assessment/Plan    PT Assessment Patient needs continued PT services  PT Problem List Decreased strength;Decreased activity tolerance;Decreased balance;Decreased mobility       PT Treatment Interventions DME instruction;Gait training;Functional mobility training;Neuromuscular re-education;Balance training;Therapeutic exercise;Therapeutic activities;Patient/family education    PT Goals (Current goals can be found in the Care Plan section)  Acute Rehab PT Goals Patient Stated Goal: "To be back home doing things for myself" PT Goal Formulation: With patient Time For Goal Achievement: 05/26/16 Potential to Achieve Goals: Good    Frequency Min 2X/week   Barriers to discharge        Co-evaluation               End of Session Equipment Utilized During Treatment: Gait belt;Oxygen Activity Tolerance: Patient limited by fatigue Patient left: in bed;with bed alarm set;with call bell/phone within reach;with SCD's reapplied Nurse Communication: Mobility status PT Visit Diagnosis: Muscle weakness (generalized) (M62.81);Difficulty in walking, not elsewhere classified (R26.2)    Time: 1388-7195 PT Time Calculation (min) (ACUTE ONLY): 38 min   Charges:   PT Evaluation $PT Eval Low Complexity: 1 Procedure PT Treatments $Therapeutic Exercise: 8-22 mins $Therapeutic Activity: 8-22 mins   PT G Codes:         DRoyetta Asal PT, DPT 05/13/16, 12:06 PM

## 2016-05-13 NOTE — Progress Notes (Signed)
[facility] NAME: Kerri Carter    MR#:  [id number]  DATE OF BIRTH:  1940/06/28  SUBJECTIVE:  CHIEF COMPLAINT:   Chief Complaint  Patient presents with  . Abdominal Pain   - Had an episode of dyspnea yesterday with facial rash which have resolved at this time. -Patient feels much weaker today. Physical therapy recommended rehabilitation  REVIEW OF SYSTEMS:  Review of Systems  Constitutional: Negative for chills, fever and malaise/fatigue.  HENT: Negative for congestion, ear discharge, ear pain, hearing loss and nosebleeds.   Respiratory: Positive for shortness of breath. Negative for cough and wheezing.   Cardiovascular: Negative for chest pain, palpitations and leg swelling.  Gastrointestinal: Positive for constipation. Negative for abdominal pain, diarrhea, nausea and vomiting.  Genitourinary: Negative for dysuria.  Musculoskeletal: Negative for myalgias.  Skin: Positive for rash.  Neurological: Negative for dizziness, sensory change, speech change, focal weakness, seizures and headaches.  Psychiatric/Behavioral: Negative for depression.    DRUG ALLERGIES:   Allergies  Allergen Reactions  . Ceftin [Cefuroxime Axetil] Anaphylaxis    Other reaction(s): Other (See Comments) REACTION: tongue and throat swell Other Reaction: TONGUE AND THROAT SWELLING REACTION: tongue and throat swell  . Lisinopril Anaphylaxis    REACTION: tongue and throat swelling (onset 10-10-09)  . Antihistamines, Chlorpheniramine-Type     REACTION: makes pt hyper  . Antivert [Meclizine Hcl]     Other reaction(s): Unknown REACTION: bladder will not empty REACTION: bladder will not empty  . Decongestant [Pseudoephedrine Hcl]     Other reaction(s): Unknown REACTION: makes pt hyper REACTION: makes pt hyper  . Doxycycline     REACTION: GI upset  . Morphine And Related Other (See Comments)    Per patient, low blood pressure issues, following morphine,  that requires action to raise it back up.  . Polymyxin B     Medication was in eye drops.  . Sulfa Antibiotics     Face swelling  . Xarelto [Rivaroxaban]     Other reaction(s): Other (See Comments) GI bleed Stomach burning, bleeding, and tar in stool GI bleed  . Adhesive [Tape] Rash  . Iodine Hives and Rash  . Levaquin [Levofloxacin In D5w] Rash  . Tetanus Toxoids     REACTION: rash, fever, hot to touch at injection site    VITALS:  Blood pressure (!) 148/56, pulse 73, temperature 97.4 F (36.3 C), resp. rate 18, height 5\' 4"  (1.626 m), weight 79.4 kg (175 lb 1.6 oz), SpO2 95 %.  PHYSICAL EXAMINATION:  Physical Exam  GENERAL:  76 y.o.-year-old patient lying in the bed with no acute distress.  EYES: Pupils equal, round, reactive to light and accommodation. No scleral icterus. Extraocular muscles intact.  HEENT: Head atraumatic, normocephalic. Oropharynx and nasopharynx clear. No facial rash noted today. NECK:  Supple, no jugular venous distention. No thyroid enlargement, no tenderness.  LUNGS: Normal breath sounds bilaterally, no wheezing, rales,rhonchi or crepitation. No use of accessory muscles of respiration.  CARDIOVASCULAR: S1, S2 normal. No murmurs, rubs, or gallops.  ABDOMEN: Soft with some tenderness around the surgical site. Has a drain with minimal bloody fluid in it. Hypoactive Bowel sounds present. No organomegaly or mass.  EXTREMITIES: No pedal edema, cyanosis, or clubbing.  NEUROLOGIC: Cranial nerves II through XII are intact. Muscle strength 5/5 in all extremities. Sensation intact. Gait not checked.  PSYCHIATRIC: The patient is alert and oriented x 3.  SKIN: No obvious rash, lesion, or ulcer.  LABORATORY PANEL:   CBC  Recent Labs Lab 05/13/16 0502  WBC 14.6*  HGB 10.2*  HCT 30.7*  PLT 333   ------------------------------------------------------------------------------------------------------------------  Chemistries   Recent Labs Lab  05/13/16 0502  NA 140  K 4.1  CL 106  CO2 27  GLUCOSE 132*  BUN 14  CREATININE 1.12*  CALCIUM 8.5*  AST 28  ALT 24  ALKPHOS 229*  BILITOT 0.8   ------------------------------------------------------------------------------------------------------------------  Cardiac Enzymes  Recent Labs Lab 05/07/16 0524  TROPONINI <0.03   ------------------------------------------------------------------------------------------------------------------  RADIOLOGY:  Dg Cholangiogram Operative  Result Date: 05/11/2016 CLINICAL DATA:  Intraoperative cholangiogram during laparoscopic cholecystectomy. EXAM: INTRAOPERATIVE CHOLANGIOGRAM FLUOROSCOPY TIME:  32 seconds COMPARISON:  MRCP - 05/07/2016 FINDINGS: Intraoperative cholangiographic images of the right upper abdominal quadrant during laparoscopic cholecystectomy are provided for review. Surgical clips overlie the expected location of the gallbladder fossa. Contrast injection demonstrates selective cannulation of the central aspect of the cystic duct. There is passage of contrast through the central aspect of the cystic duct with filling of a mildly dilated common bile duct. There is passage of contrast though the CBD and into the descending portion of the duodenum. There is minimal reflux of injected contrast into the common hepatic duct and central aspect of the non dilated intrahepatic biliary system. There are no discrete filling defects within the opacified portions of the biliary system to suggest the presence of choledocholithiasis. IMPRESSION: No evidence of choledocholithiasis. Electronically Signed   By: Sandi Mariscal M.D.   On: 05/11/2016 15:58   Dg Chest Port 1 View  Result Date: 05/12/2016 CLINICAL DATA:  Dyspnea EXAM: PORTABLE CHEST 1 VIEW COMPARISON:  11/25/2015 CXR. FINDINGS: Slightly lower lung volumes than on prior with crowding of interstitial lung markings. Borderline cardiomegaly. No aortic aneurysm. No acute nor suspicious osseous  abnormalities. Mild interstitial edema. IMPRESSION: Borderline cardiomegaly with mild interstitial edema. Electronically Signed   By: Ashley Royalty M.D.   On: 05/12/2016 22:35    EKG:   Orders placed or performed during the hospital encounter of 05/07/16  . EKG 12-Lead  . EKG 12-Lead    ASSESSMENT AND PLAN:   76 year old female with past medical history significant for hypertension, COPD, rheumatoid arthritis, hyperlipidemia, depression admitted for gallstone pancreatitis.  1. Acute Gallstone pancreatitis/ acute choledocholithiasis -Appreciate surgical and GI consults -MRCP shows CDB dilatation with stones -Status post ERCP and stones removed. LFTs improved. -Status post laparoscopic cholecystectomy on 05/21/2016. Doing well. Tolerating regular diet. -Still has a drain present. Anticipate discharge tomorrow.   2. Chronic kidney disease: Stage III; stable renal function.  3. Hypertension: Controlled; continue nebivolol  4. Dyspnea-last evening had possible allergic reaction with facial rash and dyspnea. Chest x-ray with mild congestion noted. One dose of Lasix given with significant improvement. Currently on 2 L oxygen. At home only uses at nighttime with CPAP Wean oxygen as tolerated. One dose of prednisone received. Rash is much better today.  5. Rheumatoid arthritis: Continue methotrexate at discharge  6. Left ear ache-neuropathy pain, improved with gabapentin now  7. Depression: Continue Lamictal and Seroquel  8. DVT prophylaxis- Lovenox   Physical therapy recommended skilled nursing facility. Social worker consulted   All the records are reviewed and case discussed with Care Management/Social Workerr. Management plans discussed with the patient, family and they are in agreement.  CODE STATUS:  Full code  TOTAL TIME TAKING CARE OF THIS PATIENT: 36 minutes.   POSSIBLE D/C Tomorrow, DEPENDING ON CLINICAL CONDITION.   Gladstone Lighter M.D on 05/13/2016 at 1:37  PM  Between 7am to 6pm - Pager - [phone]  After 6pm go to [email or web address] - password [email or web address] [facility]  Office  [phone]  CC: Primary care physician; Coral Spikes, DO

## 2016-05-13 NOTE — NC FL2 (Signed)
[place] LEVEL OF CARE SCREENING TOOL     IDENTIFICATION  Patient Name: Kerri Carter Birthdate: 27-Feb-1940 Sex: female Admission Date (Current Location): 05/07/2016  [facility] and [place] Number:  Engineering geologist and Address:  [facility], [address], [place], [place] [address]      Provider Number: [id number]  Attending Physician Name and Address:  Gladstone Lighter, MD  Relative Name and Phone Number:       Current Level of Care: Hospital Recommended Level of Care: [place] Prior Approval Number:    Date Approved/Denied:   PASRR Number:    Discharge Plan: SNF    Current Diagnoses: Patient Active Problem List   Diagnosis Date Noted  . Gallstone pancreatitis 05/07/2016  . Abnormal liver enzymes   . RUQ pain   . Vomiting   . Calculus of gallbladder and bile duct with obstruction without cholecystitis   . LLQ pain 02/29/2016  . Mixed bipolar I disorder ([place]) 10/09/2015  . CKD (chronic kidney disease), stage III   . Essential hypertension   . Anemia 08/15/2015  . Conversion disorder 08/04/2015  . Anxiety and depression 07/17/2015  . Hyperlipidemia 07/17/2015  . 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 Macular degeneration 07/17/2015  . Insomnia 07/17/2015  . Rheumatoid arthritis ([place]) 12/05/2014  . Valvular heart disease 10/13/2011  . COPD (chronic obstructive pulmonary disease) ([place]) 09/09/2011  . OSA (obstructive sleep apnea) 09/09/2011  . Nocturnal hypoxemia 09/09/2011  . Gastroesophageal reflux disease 02/24/2011  . Dyslipidemia 02/24/2011  . Nonrheumatic aortic valve insufficiency 02/24/2011  . Non-rheumatic mitral regurgitation 02/24/2011    Orientation RESPIRATION BLADDER Height & Weight     Self, Time, Situation, Place  Normal Continent Weight: 175 lb 1.6 oz (79.4 kg) Height:  5\' 4"  (162.6 cm)  BEHAVIORAL SYMPTOMS/MOOD NEUROLOGICAL BOWEL NUTRITION STATUS   (none)  (none) Continent Diet (regular)  AMBULATORY STATUS COMMUNICATION OF NEEDS Skin   Limited Assist Verbally Normal                       Personal Care Assistance Level of Assistance  Bathing, Dressing Bathing Assistance: Limited assistance   Dressing Assistance: Limited assistance     Functional Limitations Info   (no issues)          SPECIAL CARE FACTORS FREQUENCY  PT (By licensed PT)                    Contractures Contractures Info: Not present    Additional Factors Info  Code Status, Allergies Code Status Info: full Allergies Info: ceftin; lisinporil; antihistamines; antivert; doxycycline; morphine; adhesive tape; iodine; levaquin; tetanus toxoids           Current Medications (05/13/2016):  This is the current hospital active medication list Current Facility-Administered Medications  Medication Dose Route Frequency Provider Last Rate Last Dose  . acetaminophen (TYLENOL) tablet 650 mg  650 mg Oral Q6H PRN Harrie Foreman, MD   650 mg at 05/07/16 1845   Or  . acetaminophen (TYLENOL) suppository 650 mg  650 mg Rectal Q6H PRN Harrie Foreman, MD      . docusate sodium (COLACE) capsule 100 mg  100 mg Oral BID Harrie Foreman, MD   100 mg at 05/13/16 1034  . enoxaparin (LOVENOX) injection 40 mg  40 mg Subcutaneous Q24H Gladstone Lighter, MD   40 mg at 05/12/16 2136  . escitalopram (LEXAPRO) tablet 10 mg  10 mg Oral Daily Harrie Foreman, MD   10 mg at 05/13/16 1034  . fluticasone furoate-vilanterol (BREO ELLIPTA) 100-25 MCG/INH 1 puff  1 puff Inhalation Daily Harrie Foreman, MD   1 puff at 05/11/16 1330  . folic acid (FOLVITE) tablet 1 mg  1 mg Oral Daily Harrie Foreman, MD   1 mg at 05/13/16 1034  . gabapentin (NEURONTIN) capsule 300 mg  300 mg Oral TID Gladstone Lighter, MD   300 mg at 05/13/16 1034  . HYDROcodone-acetaminophen (NORCO/VICODIN) 5-325 MG per tablet 1 tablet  1 tablet Oral Q4H PRN Robert Bellow, MD   1 tablet at 05/13/16  0515  . ipratropium-albuterol (DUONEB) 0.5-2.5 (3) MG/3ML nebulizer solution 3 mL  3 mL Nebulization Q4H PRN Lance Coon, MD   3 mL at 05/12/16 2207  . lamoTRIgine (LAMICTAL) tablet 100 mg  100 mg Oral BID Harrie Foreman, MD   100 mg at 05/13/16 1033  . LORazepam (ATIVAN) tablet 0.5 mg  0.5 mg Oral QHS Fritzi Mandes, MD   0.5 mg at 05/12/16 2135  . montelukast (SINGULAIR) tablet 10 mg  10 mg Oral QHS Gladstone Lighter, MD   10 mg at 05/12/16 2135  . morphine 2 MG/ML injection 1-2 mg  1-2 mg Intravenous Q4H PRN Fritzi Mandes, MD   1 mg at 05/11/16 1814  . nebivolol (BYSTOLIC) tablet 5 mg  5 mg Oral Daily Harrie Foreman, MD   5 mg at 05/12/16 1517  . ondansetron (ZOFRAN) tablet 4 mg  4 mg Oral Q6H PRN Harrie Foreman, MD       Or  . ondansetron [facility]) injection 4 mg  4 mg Intravenous Q6H PRN Harrie Foreman, MD      . pantoprazole (PROTONIX) EC tablet 40 mg  40 mg Oral BID Harrie Foreman, MD   40 mg at 05/13/16 1034  . QUEtiapine (SEROQUEL) tablet 25 mg  25 mg Oral QHS Fritzi Mandes, MD   25 mg at 05/12/16 2135  . senna (SENOKOT) tablet 8.6 mg  1 tablet Oral QHS Gladstone Lighter, MD   8.6 mg at 05/12/16 2135  . tiotropium (SPIRIVA) inhalation capsule 18 mcg  18 mcg Inhalation Daily Gladstone Lighter, MD         Discharge Medications: Please see discharge summary for a list of discharge medications.  Relevant Imaging Results:  Relevant Lab Results:   Additional Information ss: [id number]  Shela Leff, LCSW

## 2016-05-13 NOTE — Anesthesia Postprocedure Evaluation (Signed)
Anesthesia Post Note  Patient: Kerri Carter  Procedure(s) Performed: Procedure(s) (LRB): ENDOSCOPIC RETROGRADE CHOLANGIOPANCREATOGRAPHY (ERCP) WITH PROPOFOL (N/A)  Patient location during evaluation: Endoscopy Anesthesia Type: General Level of consciousness: awake and alert Pain management: pain level controlled Vital Signs Assessment: post-procedure vital signs reviewed and stable Respiratory status: spontaneous breathing, nonlabored ventilation, respiratory function stable and patient connected to nasal cannula oxygen Cardiovascular status: blood pressure returned to baseline and stable Postop Assessment: no signs of nausea or vomiting Anesthetic complications: no     Last Vitals:  Vitals:   05/13/16 1153 05/13/16 1300  BP:  (!) 148/56  Pulse: 70 73  Resp:  18  Temp:  36.3 C    Last Pain:  Vitals:   05/13/16 0807  TempSrc:   PainSc: 2                  Martha Clan

## 2016-05-13 NOTE — Progress Notes (Signed)
2 Days Post-Op  Subjective: Patient likely experienced an allergic reaction yesterday to an unknown agent. She had swelling in her face and problems breathing is much improved now. She is tolerating a regular diet has minimal abdominal pain.  Objective: Vital signs in last 24 hours: Temp:  [97.9 F (36.6 C)-98.8 F (37.1 C)] 98.4 F (36.9 C) (04/11 0530) Pulse Rate:  [69-78] 72 (04/11 0530) Resp:  [17-19] 19 (04/11 0530) BP: (122-154)/(45-60) 154/50 (04/11 0530) SpO2:  [91 %-96 %] 96 % (04/11 0530) Weight:  [175 lb 1.6 oz (79.4 kg)] 175 lb 1.6 oz (79.4 kg) (04/11 0500) Last BM Date: 05/08/16  Intake/Output from previous day: 04/10 0701 - 04/11 0700 In: 1921 [P.O.:1660; I.V.:261] Out: 1935 [Urine:1900; Drains:35] Intake/Output this shift: No intake/output data recorded.  Physical exam:  Wounds are clean no bile in drain soft nontender abdomen no icterus no jaundice no sign of swelling  Lab Results: CBC   Recent Labs  05/12/16 0524 05/13/16 0502  WBC 14.5* 14.6*  HGB 10.3* 10.2*  HCT 32.0* 30.7*  PLT 332 333   BMET  Recent Labs  05/12/16 0524 05/13/16 0502  NA 140 140  K 4.1 4.1  CL 107 106  CO2 25 27  GLUCOSE 150* 132*  BUN 11 14  CREATININE 1.22* 1.12*  CALCIUM 8.6* 8.5*   PT/INR No results for input(s): LABPROT, INR in the last 72 hours. ABG No results for input(s): PHART, HCO3 in the last 72 hours.  Invalid input(s): PCO2, PO2  Studies/Results: Dg Cholangiogram Operative  Result Date: 05/11/2016 CLINICAL DATA:  Intraoperative cholangiogram during laparoscopic cholecystectomy. EXAM: INTRAOPERATIVE CHOLANGIOGRAM FLUOROSCOPY TIME:  32 seconds COMPARISON:  MRCP - 05/07/2016 FINDINGS: Intraoperative cholangiographic images of the right upper abdominal quadrant during laparoscopic cholecystectomy are provided for review. Surgical clips overlie the expected location of the gallbladder fossa. Contrast injection demonstrates selective cannulation of the  central aspect of the cystic duct. There is passage of contrast through the central aspect of the cystic duct with filling of a mildly dilated common bile duct. There is passage of contrast though the CBD and into the descending portion of the duodenum. There is minimal reflux of injected contrast into the common hepatic duct and central aspect of the non dilated intrahepatic biliary system. There are no discrete filling defects within the opacified portions of the biliary system to suggest the presence of choledocholithiasis. IMPRESSION: No evidence of choledocholithiasis. Electronically Signed   By: Sandi Mariscal M.D.   On: 05/11/2016 15:58   Dg Chest Port 1 View  Result Date: 05/12/2016 CLINICAL DATA:  Dyspnea EXAM: PORTABLE CHEST 1 VIEW COMPARISON:  11/25/2015 CXR. FINDINGS: Slightly lower lung volumes than on prior with crowding of interstitial lung markings. Borderline cardiomegaly. No aortic aneurysm. No acute nor suspicious osseous abnormalities. Mild interstitial edema. IMPRESSION: Borderline cardiomegaly with mild interstitial edema. Electronically Signed   By: Ashley Royalty M.D.   On: 05/12/2016 22:35    Anti-infectives: Anti-infectives    Start     Dose/Rate Route Frequency Ordered Stop   05/11/16 1300  clindamycin (CLEOCIN) IVPB 600 mg     600 mg 100 mL/hr over 30 Minutes Intravenous  Once 05/11/16 1049 05/11/16 1520   05/07/16 1130  piperacillin-tazobactam (ZOSYN) IVPB 3.375 g  Status:  Discontinued     3.375 g 12.5 mL/hr over 240 Minutes Intravenous Every 8 hours 05/07/16 1015 05/07/16 1537      Assessment/Plan: s/p Procedure(s): LAPAROSCOPIC CHOLECYSTECTOMY   LFTs are normal. With her problems breathing  etc. may be of value to continue her in the hospital for another day and remove her drain tomorrow if she is improved she could be discharged tomorrow.  Florene Glen, MD, FACS  05/13/2016

## 2016-05-13 NOTE — Clinical Social Work Note (Signed)
Clinical Social Work Assessment  Patient Details  Name: Kerri Carter MRN: [id number] Date of Birth: 02/28/1940  Date of referral:  05/13/16               Reason for consult:  Facility Placement                Permission sought to share information with:  Facility Sport and exercise psychologist, Family Supports Permission granted to share information::  Yes, Verbal Permission Granted  Name::        Agency::     Relationship::     Contact Information:     Housing/Transportation Living arrangements for the past 2 months:  Single Family Home Source of Information:  Patient Patient Interpreter Needed:  None Criminal Activity/Legal Involvement Pertinent to Current Situation/Hospitalization:  No - Comment as needed Significant Relationships:  Spouse Lives with:  Spouse Do you feel safe going back to the place where you live?  Yes Need for family participation in patient care:  Yes (Comment)  Care giving concerns:  Patient resides with her spouse.   Social Worker assessment / plan:  CSW informed by physician today that PT recommended STR and that she was going to be ready to discharge patient tomorrow. CSW spoke with patient this afternoon and she stated that her husband is going to stay the night here with her and she wishes to discuss it with him when he comes. Patient gave permission to go ahead and send out her referral to see what facilities might be able to take her. She is contemplating returning home at discharge as well. She asked that CSW stop back in tomorrow morning to get her answer. Patient is aware that she is to be discharged tomorrow.   Employment status:  Retired Nurse, adult PT Recommendations:    Information / Referral to community resources:     Patient/Family's Response to care:  Patient expressed appreciation for CSW assistance.  Patient/Family's Understanding of and Emotional Response to Diagnosis, Current Treatment, and Prognosis:  Patient is  aware of her limitations and abilities and is open to either home or STR and will discuss it with her husband.   Emotional Assessment Appearance:  Appears stated age Attitude/Demeanor/Rapport:   (pleasant and cooperative) Affect (typically observed):  Accepting, Adaptable, Calm, Happy Orientation:  Oriented to Self, Oriented to Place, Oriented to  Time, Oriented to Situation Alcohol / Substance use:  Not Applicable Psych involvement (Current and /or in the community):  No (Comment)  Discharge Needs  Concerns to be addressed:  Care Coordination Readmission within the last 30 days:  No Current discharge risk:  None Barriers to Discharge:  No Barriers Identified   Shela Leff, LCSW 05/13/2016, 3:32 PM

## 2016-05-14 ENCOUNTER — Telehealth: Payer: Self-pay | Admitting: Family Medicine

## 2016-05-14 LAB — GLUCOSE, CAPILLARY
Glucose-Capillary: 79 mg/dL (ref 65–99)
Glucose-Capillary: 188 mg/dL — ABNORMAL HIGH (ref 65–99)

## 2016-05-14 MED ORDER — DOCUSATE SODIUM 100 MG PO CAPS: 100.0000 mg | ORAL_CAPSULE | Freq: Two times a day (BID) | ORAL | 0 refills | Status: DC

## 2016-05-14 MED ORDER — IPRATROPIUM-ALBUTEROL 0.5-2.5 (3) MG/3ML IN SOLN: 3.0000 mL | RESPIRATORY_TRACT | 0 refills | Status: DC | PRN

## 2016-05-14 MED ORDER — LORAZEPAM 0.5 MG PO TABS
0.5000 mg | ORAL_TABLET | Freq: Every day | ORAL | 0 refills | Status: DC
Start: 2016-05-14 — End: 2016-07-29

## 2016-05-14 MED ORDER — POLYETHYLENE GLYCOL 3350 17 G PO PACK: 17.0000 g | PACK | Freq: Every day | ORAL | 0 refills | Status: DC | PRN

## 2016-05-14 MED ORDER — HYDROCODONE-ACETAMINOPHEN 5-325 MG PO TABS: 1.0000 | ORAL_TABLET | Freq: Four times a day (QID) | ORAL | 0 refills | Status: DC | PRN

## 2016-05-14 MED ORDER — FLEET ENEMA 7-19 GM/118ML RE ENEM: 1.0000 | ENEMA | Freq: Once | RECTAL | Status: DC | PRN

## 2016-05-14 NOTE — Clinical Social Work Note (Signed)
Discharge information sent to [facility] at [facility]. Patient and husband are in agreement with discharge today. Patient is aware that EMS may not be covered and so she has decided to have her husband transport her. Shela Leff MSW,LCSW [phone]

## 2016-05-14 NOTE — Telephone Encounter (Signed)
HFU, Pt will be discharged today. Dx was Gallstones. Pt is scheduled for 05/21/2016 @ 11:30am. Thank you!

## 2016-05-14 NOTE — Care Management Important Message (Signed)
Important Message  Patient Details  Name: Kerri Carter MRN: [id number] Date of Birth: 01-Feb-1941   Medicare Important Message Given:  Yes    Beverly Sessions, RN 05/14/2016, 10:50 AM

## 2016-05-14 NOTE — Progress Notes (Signed)
Report called to [facility], [place], Therapist, sports.

## 2016-05-14 NOTE — Progress Notes (Signed)
3 Days Post-Op  Subjective: Patient feels much better today after laparoscopic cholecystectomy  Objective: Vital signs in last 24 hours: Temp:  [97.4 F (36.3 C)-98.1 F (36.7 C)] 97.8 F (36.6 C) (04/12 0606) Pulse Rate:  [63-73] 63 (04/12 0606) Resp:  [16-18] 18 (04/12 0606) BP: (148-156)/(56-59) 156/59 (04/12 0606) SpO2:  [94 %-100 %] 100 % (04/12 0606) Weight:  [171 lb 6.4 oz (77.7 kg)] 171 lb 6.4 oz (77.7 kg) (04/12 0500) Last BM Date: 05/08/16  Intake/Output from previous day: 04/11 0701 - 04/12 0700 In: 1440 [P.O.:1440] Out: 3150 [Urine:3150] Intake/Output this shift: No intake/output data recorded.  Physical exam: No icterus no jaundice abdomen is soft nontender no bile in drain drain is removed and a dressing is placed calves are nontender  Lab Results: CBC   Recent Labs  05/12/16 0524 05/13/16 0502  WBC 14.5* 14.6*  HGB 10.3* 10.2*  HCT 32.0* 30.7*  PLT 332 333   BMET  Recent Labs  05/12/16 0524 05/13/16 0502  NA 140 140  K 4.1 4.1  CL 107 106  CO2 25 27  GLUCOSE 150* 132*  BUN 11 14  CREATININE 1.22* 1.12*  CALCIUM 8.6* 8.5*   PT/INR No results for input(s): LABPROT, INR in the last 72 hours. ABG No results for input(s): PHART, HCO3 in the last 72 hours.  Invalid input(s): PCO2, PO2  Studies/Results: Dg Chest Port 1 View  Result Date: 05/12/2016 CLINICAL DATA:  Dyspnea EXAM: PORTABLE CHEST 1 VIEW COMPARISON:  11/25/2015 CXR. FINDINGS: Slightly lower lung volumes than on prior with crowding of interstitial lung markings. Borderline cardiomegaly. No aortic aneurysm. No acute nor suspicious osseous abnormalities. Mild interstitial edema. IMPRESSION: Borderline cardiomegaly with mild interstitial edema. Electronically Signed   By: Ashley Royalty M.D.   On: 05/12/2016 22:35    Anti-infectives: Anti-infectives    Start     Dose/Rate Route Frequency Ordered Stop   05/11/16 1300  clindamycin (CLEOCIN) IVPB 600 mg     600 mg 100 mL/hr over 30  Minutes Intravenous  Once 05/11/16 1049 05/11/16 1520   05/07/16 1130  piperacillin-tazobactam (ZOSYN) IVPB 3.375 g  Status:  Discontinued     3.375 g 12.5 mL/hr over 240 Minutes Intravenous Every 8 hours 05/07/16 1015 05/07/16 1537      Assessment/Plan: s/p Procedure(s): LAPAROSCOPIC CHOLECYSTECTOMY   Status post laparoscopic cholecystectomy. Patient doing very well could go home at any time drain is been removed. Patient and husband questioned about going to [facility] as they have been there before and this will be discussed with prime doc. Follow-up in our office in 10 days  Florene Glen, MD, FACS  05/14/2016

## 2016-05-14 NOTE — Clinical Social Work Note (Signed)
PT has seen patient this morning and still recommending STR and patient is agreeable and prefers [facility]. Bed at [facility] is available. Will facilitate discharge when ready. Shela Leff MSW,LCSW [phone]

## 2016-05-14 NOTE — Clinical Social Work Placement (Signed)
   CLINICAL SOCIAL WORK PLACEMENT  NOTE  Date:  05/14/2016  Patient Details  Name: Kerri Carter MRN: [id number] Date of Birth: Sep 04, 1940  Clinical Social Work is seeking post-discharge placement for this patient at the [place] level of care (*CSW will initial, date and re-position this form in  chart as items are completed):  Yes   Patient/family provided with [place] Work Department's list of facilities offering this level of care within the geographic area requested by the patient (or if unable, by the patient's family).  Yes   Patient/family informed of their freedom to choose among providers that offer the needed level of care, that participate in Medicare, Medicaid or managed care program needed by the patient, have an available bed and are willing to accept the patient.  Yes   Patient/family informed of 's ownership interest in [facility] and [facility], as well as of the fact that they are under no obligation to receive care at these facilities.  PASRR submitted to EDS on       PASRR number received on       Existing PASRR number confirmed on 05/14/16     FL2 transmitted to all facilities in geographic area requested by pt/family on 05/13/16     FL2 transmitted to all facilities within larger geographic area on       Patient informed that his/her managed care company has contracts with or will negotiate with certain facilities, including the following:        Yes   Patient/family informed of bed offers received.  Patient chooses bed at  [facility])     Physician recommends and patient chooses bed at  [facility])    Patient to be transferred to  [facility]) on 05/14/16.  Patient to be transferred to facility by  (personal vehicle)     Patient family notified on 05/14/16 of transfer.  Name of family member notified:        PHYSICIAN       Additional Comment:     _______________________________________________ Shela Leff, LCSW 05/14/2016, 11:23 AM

## 2016-05-14 NOTE — Discharge Summary (Signed)
[facility] [place] NAME: Kerri Carter    MR#:  [id number]  DATE OF BIRTH:  Oct 12, 1940  DATE OF ADMISSION:  05/07/2016   ADMITTING PHYSICIAN: Harrie Foreman, MD  DATE OF DISCHARGE:  05/14/2016  PRIMARY CARE PHYSICIAN: Coral Spikes, DO   ADMISSION DIAGNOSIS:   Vomiting [R11.10] RUQ pain [R10.11] Gallstone pancreatitis [K85.10] Calculus of gallbladder and bile duct with obstruction without cholecystitis [K80.71]  DISCHARGE DIAGNOSIS:   Active Problems:   Gallstone pancreatitis   Abnormal liver enzymes   RUQ pain   Vomiting   Calculus of gallbladder and bile duct with obstruction without cholecystitis   SECONDARY DIAGNOSIS:   Past Medical History:  Diagnosis Date  . Anxiety   . Asthma   . Chicken pox   . CKD (chronic kidney disease), stage III    a. s/p R nephrectomy.  . Conversion disorder   . COPD (chronic obstructive pulmonary disease) ([place])   . Depression   . Diabetes mellitus without complication ([place])    Patient denies Diabetes, does not take any diabetic medications  . Essential hypertension   . GERD (gastroesophageal reflux disease)   . Hyperlipidemia   . Inflammatory arthritis    a. hands/carpal tunnel.  b. Low titer rheumatoid factor. c. Negative anti-CCP antibodies. d. Plaquenil.  . Non-Obstructive CAD    a. 07/2009 Cath ([facility]): nonobs dzs;  b. 03/2011 Cath [facility] nonobs dzs.  . Osteoarthritis    a. Knees.  . PUD (peptic ulcer disease)   . Toxic maculopathy   . Valvular heart disease    a. 07/2015 Echo: EF 55-60%, Mild AI, AS, MR, and TR.    HOSPITAL COURSE:   76 year old female with past medical history significant for hypertension, COPD, rheumatoid arthritis, hyperlipidemia, depression admitted for gallstone pancreatitis.  1. Acute Gallstone pancreatitis/ acute choledocholithiasis -Appreciate surgical and GI consults -MRCP shows CDB dilatation with stones -Status post ERCP and stones removed.  LFTs improved. -Status post laparoscopic cholecystectomy on 05/21/2016. Doing well. Tolerating regular diet. -Drain has been removed.  2. Chronic kidney disease: Stage III; stable renal function.  3. Hypertension: Controlled; continue nebivolol, also on Lasix at home  4. Chronic diastolic CHF-takes Lasix at home. Last echo from here showing LVH and normal ejection fraction. -Prior cardiac catheterization with nonobstructive coronary disease. Lasix was held on admission and patient received IV fluids which caused mild congestion and CHF exacerbation. Improved after Lasix IV. -Currently euvolemic and Lasix will be restarted  5. Rheumatoid arthritis: Continue methotrexate at discharge  6. Left ear ache-neuropathy pain, improved with gabapentin now  7. Depression: Continue Lamictal and Seroquel  8. Constipation-medications added  Possible discharge to rehabilitation today. Physical therapy recommended rehabilitation  DISCHARGE CONDITIONS:   Guarded  CONSULTS OBTAINED:   Treatment Team:  Clayburn Pert, MD  DRUG ALLERGIES:   Allergies  Allergen Reactions  . Ceftin [Cefuroxime Axetil] Anaphylaxis    Other reaction(s): Other (See Comments) REACTION: tongue and throat swell Other Reaction: TONGUE AND THROAT SWELLING REACTION: tongue and throat swell  . Lisinopril Anaphylaxis    REACTION: tongue and throat swelling (onset 10-10-09)  . Antihistamines, Chlorpheniramine-Type     REACTION: makes pt hyper  . Antivert [Meclizine Hcl]     Other reaction(s): Unknown REACTION: bladder will not empty REACTION: bladder will not empty  . Decongestant [Pseudoephedrine Hcl]     Other reaction(s): Unknown REACTION: makes pt hyper REACTION: makes pt hyper  . Doxycycline  REACTION: GI upset  . Morphine And Related Other (See Comments)    Per patient, low blood pressure issues, following morphine, that requires action to raise it back up.  . Polymyxin B     Medication was in eye  drops.  . Sulfa Antibiotics     Face swelling  . Xarelto [Rivaroxaban]     Other reaction(s): Other (See Comments) GI bleed Stomach burning, bleeding, and tar in stool GI bleed  . Adhesive [Tape] Rash  . Iodine Hives and Rash  . Levaquin [Levofloxacin In D5w] Rash  . Tetanus Toxoids     REACTION: rash, fever, hot to touch at injection site   DISCHARGE MEDICATIONS:   Allergies as of 05/14/2016      Reactions   Ceftin [cefuroxime Axetil] Anaphylaxis   Other reaction(s): Other (See Comments) REACTION: tongue and throat swell Other Reaction: TONGUE AND THROAT SWELLING REACTION: tongue and throat swell   Lisinopril Anaphylaxis   REACTION: tongue and throat swelling (onset 10-10-09)   Antihistamines, Chlorpheniramine-type    REACTION: makes pt hyper   Antivert [meclizine Hcl]    Other reaction(s): Unknown REACTION: bladder will not empty REACTION: bladder will not empty   Decongestant [pseudoephedrine Hcl]    Other reaction(s): Unknown REACTION: makes pt hyper REACTION: makes pt hyper   Doxycycline    REACTION: GI upset   Morphine And Related Other (See Comments)   Per patient, low blood pressure issues, following morphine, that requires action to raise it back up.   Polymyxin B    Medication was in eye drops.   Sulfa Antibiotics    Face swelling   Xarelto [rivaroxaban]    Other reaction(s): Other (See Comments) GI bleed Stomach burning, bleeding, and tar in stool GI bleed   Adhesive [tape] Rash   Iodine Hives, Rash   Levaquin [levofloxacin In D5w] Rash   Tetanus Toxoids    REACTION: rash, fever, hot to touch at injection site      Medication List    STOP taking these medications   sucralfate 1 GM/10ML suspension Commonly known as:  CARAFATE     TAKE these medications   albuterol 108 (90 Base) MCG/ACT inhaler Commonly known as:  VENTOLIN HFA Inhale 2 puffs into the lungs every 6 (six) hours as needed.   BREO ELLIPTA 100-25 MCG/INH Aepb Generic drug:   fluticasone furoate-vilanterol Inhale 1 puff into the lungs daily.   docusate sodium 100 MG capsule Commonly known as:  COLACE Take 1 capsule (100 mg total) by mouth 2 (two) times daily.   escitalopram 10 MG tablet Commonly known as:  LEXAPRO Take 1 tablet (10 mg total) by mouth daily.   folic acid 1 MG tablet Commonly known as:  FOLVITE Take 1 mg by mouth daily.   furosemide 20 MG tablet Commonly known as:  LASIX take 1 tablet by mouth once daily   gabapentin 300 MG capsule Commonly known as:  NEURONTIN take 1 capsule by mouth four times a day What changed:  See the new instructions.   HYDROcodone-acetaminophen 5-325 MG tablet Commonly known as:  NORCO/VICODIN Take 1 tablet by mouth every 6 (six) hours as needed for moderate pain or severe pain. What changed:  See the new instructions.   ipratropium-albuterol 0.5-2.5 (3) MG/3ML Soln Commonly known as:  DUONEB Take 3 mLs by nebulization every 4 (four) hours as needed.   lamoTRIgine 100 MG tablet Commonly known as:  LAMICTAL Take 1 tablet (100 mg total) by mouth 2 (two) times daily.  LORazepam 0.5 MG tablet Commonly known as:  ATIVAN Take 1 tablet (0.5 mg total) by mouth at bedtime. What changed:  how much to take   lovastatin 20 MG tablet Commonly known as:  MEVACOR Take 1 tablet (20 mg total) by mouth at bedtime.   methotrexate 50 MG/2ML injection Inject 250 mg into the skin once a week. Inject 10 ml once weekly. Patient takes on Monday.   montelukast 10 MG tablet Commonly known as:  SINGULAIR take 1 tablet by mouth once daily   multivitamin-lutein Caps capsule Take 1 capsule by mouth 2 (two) times daily.   nebivolol 10 MG tablet Commonly known as:  BYSTOLIC Take 1 tablet (10 mg total) by mouth daily. What changed:  how much to take   pantoprazole 40 MG tablet Commonly known as:  PROTONIX take 1 tablet by mouth twice a day as directed   polyethylene glycol packet Commonly known as:  MIRALAX /  GLYCOLAX Take 17 g by mouth daily as needed for mild constipation.   potassium chloride 10 MEQ tablet Commonly known as:  K-DUR take 1 tablet by mouth once daily   QUEtiapine 25 MG tablet Commonly known as:  SEROQUEL Take 1 tablet (25 mg total) by mouth at bedtime.   RA NASAL ALLERGY 55 MCG/ACT Aero nasal inhaler Generic drug:  triamcinolone instill 2 sprays into each nostril once daily   ranitidine 150 MG capsule Commonly known as:  ZANTAC take 1 capsule by mouth twice a day        DISCHARGE INSTRUCTIONS:   1. PCP follow-up in 1-2 weeks 2. Surgery follow-up in 2 weeks  DIET:   Cardiac diet  ACTIVITY:   Activity as tolerated  OXYGEN:   Home Oxygen: No.  Oxygen Delivery: room air  DISCHARGE LOCATION:   nursing home   If you experience worsening of your admission symptoms, develop shortness of breath, life threatening emergency, suicidal or homicidal thoughts you must seek medical attention immediately by calling 911 or calling your MD immediately  if symptoms less severe.  You Must read complete instructions/literature along with all the possible adverse reactions/side effects for all the Medicines you take and that have been prescribed to you. Take any new Medicines after you have completely understood and accpet all the possible adverse reactions/side effects.   Please note  You were cared for by a hospitalist during your hospital stay. If you have any questions about your discharge medications or the care you received while you were in the hospital after you are discharged, you can call the unit and asked to speak with the hospitalist on call if the hospitalist that took care of you is not available. Once you are discharged, your primary care physician will handle any further medical issues. Please note that NO REFILLS for any discharge medications will be authorized once you are discharged, as it is imperative that you return to your primary care physician (or  establish a relationship with a primary care physician if you do not have one) for your aftercare needs so that they can reassess your need for medications and monitor your lab values.    On the day of Discharge:  VITAL SIGNS:   Blood pressure (!) 154/43, pulse 68, temperature 98.3 F (36.8 C), temperature source Oral, resp. rate 18, height 5\' 4"  (1.626 m), weight 77.7 kg (171 lb 6.4 oz), SpO2 94 %.  PHYSICAL EXAMINATION:    GENERAL:  76 y.o.-year-old patient lying in the bed with no acute distress.  EYES: Pupils equal, round, reactive to light and accommodation. No scleral icterus. Extraocular muscles intact.  HEENT: Head atraumatic, normocephalic. Oropharynx and nasopharynx clear. No facial rash noted today. NECK:  Supple, no jugular venous distention. No thyroid enlargement, no tenderness.  LUNGS: Normal breath sounds bilaterally, no wheezing, rales,rhonchi or crepitation. No use of accessory muscles of respiration.  CARDIOVASCULAR: S1, S2 normal. No murmurs, rubs, or gallops.  ABDOMEN: Soft with some tenderness around the surgical site. Drain has been removed. Hypoactive Bowel sounds present. No organomegaly or mass.  EXTREMITIES: No pedal edema, cyanosis, or clubbing.  NEUROLOGIC: Cranial nerves II through XII are intact. Muscle strength 5/5 in all extremities. Sensation intact. Gait not checked.  PSYCHIATRIC: The patient is alert and oriented x 3.  SKIN: No obvious rash, lesion, or ulcer.  DATA REVIEW:   CBC  Recent Labs Lab 05/13/16 0502  WBC 14.6*  HGB 10.2*  HCT 30.7*  PLT 333    Chemistries   Recent Labs Lab 05/13/16 0502  NA 140  K 4.1  CL 106  CO2 27  GLUCOSE 132*  BUN 14  CREATININE 1.12*  CALCIUM 8.5*  AST 28  ALT 24  ALKPHOS 229*  BILITOT 0.8     Microbiology Results  Results for orders placed or performed during the hospital encounter of 05/07/16  MRSA PCR Screening     Status: None   Collection Time: 05/10/16  8:15 PM  Result Value Ref  Range Status   MRSA by PCR NEGATIVE NEGATIVE Final    Comment:        The GeneXpert MRSA Assay (FDA approved for NASAL specimens only), is one component of a comprehensive MRSA colonization surveillance program. It is not intended to diagnose MRSA infection nor to guide or monitor treatment for MRSA infections.     RADIOLOGY:  No results found.   Management plans discussed with the patient, family and they are in agreement.  CODE STATUS:     Code Status Orders        Start     Ordered   05/07/16 0944  Full code  Continuous     05/07/16 0944    Code Status History    Date Active Date Inactive Code Status Order ID Comments User Context   09/26/2015 10:59 PM 09/27/2015  8:44 PM Full Code [id number]  Lance Coon, MD ED      TOTAL TIME TAKING CARE OF THIS PATIENT: 37 minutes.    Gladstone Lighter M.D on 05/14/2016 at 10:42 AM  Between 7am to 6pm - Pager - [phone]  After 6pm go to [email or web address] - Proofreader  [facility]  Office  [phone]  CC: Primary care physician; Coral Spikes, DO   Note: This dictation was prepared with Dragon dictation along with smaller phrase technology. Any transcriptional errors that result from this process are unintentional.

## 2016-05-14 NOTE — Progress Notes (Signed)
Pt d/c to [facility] today.  IV removed intact.  Rx's given to pt w/all questions and concerns addressed.  D/C paperwork reviewed and education provided with all questions and concerns addressed.  Pt husband at bedside for transport.

## 2016-05-14 NOTE — Progress Notes (Signed)
Physical Therapy Treatment Patient Details Name: Kerri Carter MRN: [id number] DOB: [birth year] Today's Date: 05/14/2016    History of Present Illness Pt is a 76 year old female with past medical history significant for hypertension, COPD, rheumatoid arthritis, hyperlipidemia, depression admitted for gallstone pancreatitis.  Pt is s/p ERCP with stones removed and is s/p laparoscopic cholecystectomy.    PT Comments    Pt ready for session.  O2 sats monitored during session on room air and remained in low to mid 90's during rest and mobility.  She was able to transition to edge of bed using log roll techniques.  Overall slow but able to do without assist.  She stood and ambulated 30' x 2 with walker and min guard.  Gait slow with decreased step length and height.  She reports feeling weakness in her legs limiting her gait distance.  Pt reports having a ramp to get in/out of her home which she has had increased difficulty navigating the past few weeks prior to admission.  Discussed at length with pt and husband regarding discharge plan.  SNF remains appropriate for pt due to decreased gait quality, weakness and increased fall risk navigating home environment.  Both are agreeable to rehab.  Discussed with MD and primary nurse.   Follow Up Recommendations  SNF     Equipment Recommendations  None recommended by PT    Recommendations for Other Services       Precautions / Restrictions Precautions Precautions: Fall Restrictions Weight Bearing Restrictions: No    Mobility  Bed Mobility Overal bed mobility: Needs Assistance Bed Mobility: Supine to Sit     Supine to sit: Min guard Sit to supine: HOB elevated      Transfers Overall transfer level: Needs assistance Equipment used: Rolling walker (2 wheeled) Transfers: Sit to/from Stand Sit to Stand: Min guard         General transfer comment: Min verbal cues for sequencin  Ambulation/Gait Ambulation/Gait assistance: Min  guard Ambulation Distance (Feet): 30 Feet Assistive device: Rolling walker (2 wheeled) Gait Pattern/deviations: Decreased step length - right;Decreased step length - left;Step-through pattern   Gait velocity interpretation: <1.8 ft/sec, indicative of risk for recurrent falls General Gait Details: Very slow gait limited to pain and general weakness. Decreased step height and length bilaterally increasing fall risk   Stairs            Wheelchair Mobility    Modified Rankin (Stroke Patients Only)       Balance Overall balance assessment: Needs assistance Sitting-balance support: Feet supported Sitting balance-Leahy Scale: Good     Standing balance support: Bilateral upper extremity supported Standing balance-Leahy Scale: Good                              Cognition Arousal/Alertness: Awake/alert Behavior During Therapy: WFL for tasks assessed/performed Overall Cognitive Status: Within Functional Limits for tasks assessed                                        Exercises      General Comments        Pertinent Vitals/Pain Pain Assessment: 0-10 Pain Score: 5  Pain Descriptors / Indicators: Operative site guarding;Discomfort    Home Living                      Prior Function  PT Goals (current goals can now be found in the care plan section) Progress towards PT goals: Progressing toward goals    Frequency    Min 2X/week      PT Plan Current plan remains appropriate    Co-evaluation             End of Session Equipment Utilized During Treatment: Gait belt Activity Tolerance: Patient limited by fatigue;Patient limited by pain Patient left: in chair;with chair alarm set;with call bell/phone within reach;with family/visitor present Nurse Communication: Mobility status       Time: 0935-1004 PT Time Calculation (min) (ACUTE ONLY): 29 min  Charges:  $Gait Training: 8-22 mins $Therapeutic  Exercise: 8-22 mins                    G Codes:       Chesley Noon, PTA 05/14/16, 10:38 AM

## 2016-05-15 NOTE — Telephone Encounter (Signed)
Patient was admitted to [facility] for rehab, patient spouse requested appointments to be canceled and he will reschedule on release.FYI  Appointments canceled.

## 2016-05-21 ENCOUNTER — Ambulatory Visit: Payer: Medicare Other | Admitting: Family Medicine

## 2016-05-28 ENCOUNTER — Ambulatory Visit: Payer: Medicare Other | Admitting: Family Medicine

## 2016-05-28 ENCOUNTER — Encounter: Payer: Self-pay | Admitting: General Surgery

## 2016-05-28 ENCOUNTER — Ambulatory Visit (INDEPENDENT_AMBULATORY_CARE_PROVIDER_SITE_OTHER): Payer: Medicare Other | Admitting: General Surgery

## 2016-05-28 VITALS — BP 116/59 | HR 66 | Temp 97.7°F | Ht 64.0 in | Wt 159.0 lb

## 2016-05-28 DIAGNOSIS — Z4889 Encounter for other specified surgical aftercare: Secondary | ICD-10-CM

## 2016-05-28 NOTE — Patient Instructions (Signed)
Please call our office with any questions or concerns.  It is now okay for you to  submerge in a tub, hot tub, or pool since the incisions are completely sealed.  Use sun block to incision area over the next year if this area will be exposed to sun. This helps decrease scarring.  As well as any lotions or ointments for itching.  You may resume your normal activities on 05/23/2016. At that time- Listen to your body when lifting, if you have pain when lifting, stop and then try again in a few days. Pain after doing exercises or activities of daily living is normal as you get back in to your normal routine.  If you develop redness, drainage, or pain at incision sites- call our office immediately and speak with a nurse.

## 2016-05-28 NOTE — Progress Notes (Signed)
Outpatient Surgical Follow Up  05/28/2016  Kerri Carter is an 76 y.o. female.   Chief Complaint  Patient presents with  . Routine Post Op    Laparoscopic Cholecystectomy (05/11/16- Dr. Burt Knack)    HPI: 76 year old female returns to clinic for follow-up 2 weeks status post laparoscopic cholecystectomy. She reports doing very well. She still [facility] for rehabilitation. She reports eating well, having normal bowel function, doing very well. She had an area of incision that turned red that was treated with antibiotics by a provider at the nursing facility. She denies any current fevers, chills, nausea, vomiting, chest pain, shortness breath, diarrhea, constipation. She's been very happy with her surgical..  Past Medical History:  Diagnosis Date  . Anxiety   . Asthma   . Chicken pox   . CKD (chronic kidney disease), stage III    a. s/p R nephrectomy.  . Conversion disorder   . COPD (chronic obstructive pulmonary disease) ([place])   . Depression   . Diabetes mellitus without complication ([place])    Patient denies Diabetes, does not take any diabetic medications  . Essential hypertension   . GERD (gastroesophageal reflux disease)   . Hyperlipidemia   . Inflammatory arthritis    a. hands/carpal tunnel.  b. Low titer rheumatoid factor. c. Negative anti-CCP antibodies. d. Plaquenil.  . Non-Obstructive CAD    a. 07/2009 Cath ([facility]): nonobs dzs;  b. 03/2011 Cath [facility]): nonobs dzs.  . Osteoarthritis    a. Knees.  . PUD (peptic ulcer disease)   . Toxic maculopathy   . Valvular heart disease    a. 07/2015 Echo: EF 55-60%, Mild AI, AS, MR, and TR.    Past Surgical History:  Procedure Laterality Date  . APPENDECTOMY    . BUNIONECTOMY    . CESAREAN SECTION     x1  . CHOLECYSTECTOMY N/A 05/11/2016   Procedure: LAPAROSCOPIC CHOLECYSTECTOMY;  Surgeon: Florene Glen, MD;  Location: [facility] ORS;  Service: General;  Laterality: N/A;  . COLONOSCOPY WITH PROPOFOL N/A 04/02/2016   Procedure:  COLONOSCOPY WITH PROPOFOL;  Surgeon: Jonathon Bellows, MD;  Location: [facility] ENDOSCOPY;  Service: Endoscopy;  Laterality: N/A;  . ENDOSCOPIC RETROGRADE CHOLANGIOPANCREATOGRAPHY (ERCP) WITH PROPOFOL N/A 05/08/2016   Procedure: ENDOSCOPIC RETROGRADE CHOLANGIOPANCREATOGRAPHY (ERCP) WITH PROPOFOL;  Surgeon: Lucilla Lame, MD;  Location: [facility] ENDOSCOPY;  Service: Endoscopy;  Laterality: N/A;  . ESOPHAGEAL DILATION  04/02/2016   Procedure: ESOPHAGEAL DILATION;  Surgeon: Jonathon Bellows, MD;  Location: [facility] ENDOSCOPY;  Service: Endoscopy;;  . ESOPHAGOGASTRODUODENOSCOPY (EGD) WITH PROPOFOL N/A 04/02/2016   Procedure: ESOPHAGOGASTRODUODENOSCOPY (EGD) WITH PROPOFOL;  Surgeon: Jonathon Bellows, MD;  Location: [facility] ENDOSCOPY;  Service: Endoscopy;  Laterality: N/A;  . 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 TONSILLECTOMY    . TOTAL HIP ARTHROPLASTY  12/10/11   [facility] left hip  . TUBAL LIGATION      Family History  Problem Relation Age of Onset  . Rheum arthritis Mother   . Asthma Mother   . Parkinson's disease Mother   . Heart disease Mother   . Stroke Mother   . Hypertension Mother   . Heart attack Father   . Heart disease Father   . Hypertension Father   . Diabetes Son   . Asthma Sister   . Heart disease Sister   . Lung cancer Sister   . Heart disease Sister   . Heart disease Sister   . Breast cancer Sister   .  Heart attack Sister   . Heart disease Brother   . Heart disease Maternal Grandmother   . Diabetes Maternal Grandmother   . Colon cancer Maternal Grandmother   . Heart disease Brother   . Alcohol abuse Brother   . Depression Brother     Social History:  reports that she quit smoking about 42 years ago. Her smoking use included Cigarettes. She has a 10.00 pack-year smoking history. She has never used smokeless tobacco. She reports that she does not drink alcohol or use drugs.  Allergies:  Allergies  Allergen Reactions  . Ceftin [Cefuroxime Axetil]  Anaphylaxis    Other reaction(s): Other (See Comments) REACTION: tongue and throat swell Other Reaction: TONGUE AND THROAT SWELLING REACTION: tongue and throat swell  . Lisinopril Anaphylaxis    REACTION: tongue and throat swelling (onset 10-10-09)  . Antihistamines, Chlorpheniramine-Type     REACTION: makes pt hyper  . Antivert [Meclizine Hcl]     Other reaction(s): Unknown REACTION: bladder will not empty REACTION: bladder will not empty  . Decongestant [Pseudoephedrine Hcl]     Other reaction(s): Unknown REACTION: makes pt hyper REACTION: makes pt hyper  . Doxycycline     REACTION: GI upset  . Morphine And Related Other (See Comments)    Per patient, low blood pressure issues, following morphine, that requires action to raise it back up.  . Polymyxin B     Medication was in eye drops.  . Sulfa Antibiotics     Face swelling  . Xarelto [Rivaroxaban]     Other reaction(s): Other (See Comments) GI bleed Stomach burning, bleeding, and tar in stool GI bleed  . Adhesive [Tape] Rash  . Iodine Hives and Rash  . Levaquin [Levofloxacin In D5w] Rash  . Tetanus Toxoids     REACTION: rash, fever, hot to touch at injection site    Medications reviewed.    ROS A multipoint review of systems was completed, all pertinent positives and negatives are documented within the history of present illness and remainder are negative   BP (!) 116/59   Pulse 66   Temp 97.7 F (36.5 C) (Oral)   Ht 5\' 4"  (1.626 m)   Wt 72.1 kg (159 lb)   BMI 27.29 kg/m   Physical Exam  Gen.: No acute distress Chest: Clear to auscultation Heart: Regular rate and rhythm Abdomen: Soft, nontender, nondistended. Well approximated laparoscopic incision sites with scabs present but no evidence of hyperemia, erythema, drainage.   No results found for this or any previous visit (from the past 48 hour(s)). No results found.  Assessment/Plan:  1. Aftercare following surgery 76 year old female status post  laparoscopic cholecystectomy. Pathology reviewed with the patient. Provided with standard postoperative precautions and return instructions. Patient voiced understanding and will follow-up in clinic on an as-needed basis.     Clayburn Pert, MD FACS General Surgeon  05/28/2016,3:12 PM

## 2016-06-08 ENCOUNTER — Encounter: Payer: Self-pay | Admitting: Family Medicine

## 2016-06-08 ENCOUNTER — Ambulatory Visit (INDEPENDENT_AMBULATORY_CARE_PROVIDER_SITE_OTHER): Payer: Medicare Other | Admitting: Family Medicine

## 2016-06-08 VITALS — BP 138/64 | HR 80 | Temp 98.1°F | Wt 165.4 lb

## 2016-06-08 DIAGNOSIS — K851 Biliary acute pancreatitis without necrosis or infection: Secondary | ICD-10-CM | POA: Diagnosis not present

## 2016-06-08 DIAGNOSIS — I10 Essential (primary) hypertension: Secondary | ICD-10-CM | POA: Diagnosis not present

## 2016-06-08 DIAGNOSIS — D649 Anemia, unspecified: Secondary | ICD-10-CM | POA: Diagnosis not present

## 2016-06-08 DIAGNOSIS — E785 Hyperlipidemia, unspecified: Secondary | ICD-10-CM | POA: Diagnosis not present

## 2016-06-08 LAB — COMPREHENSIVE METABOLIC PANEL
ALT: 9 U/L (ref 0–35)
AST: 17 U/L (ref 0–37)
Albumin: 3.8 g/dL (ref 3.5–5.2)
Alkaline Phosphatase: 96 U/L (ref 39–117)
BUN: 18 mg/dL (ref 6–23)
CO2: 30 mEq/L (ref 19–32)
Calcium: 9.3 mg/dL (ref 8.4–10.5)
Chloride: 102 mEq/L (ref 96–112)
Creatinine, Ser: 1.43 mg/dL — ABNORMAL HIGH (ref 0.40–1.20)
GFR: 38.15 mL/min — ABNORMAL LOW (ref >60.00)
Glucose, Bld: 125 mg/dL — ABNORMAL HIGH (ref 70–99)
Potassium: 4.1 mEq/L (ref 3.5–5.1)
Sodium: 140 mEq/L (ref 135–145)
Total Bilirubin: 0.4 mg/dL (ref 0.2–1.2)
Total Protein: 7.3 g/dL (ref 6.0–8.3)

## 2016-06-08 LAB — CBC
HCT: 33.6 % — ABNORMAL LOW (ref 36.0–46.0)
Hemoglobin: 10.8 g/dL — ABNORMAL LOW (ref 12.0–15.0)
MCHC: 32.2 g/dL (ref 30.0–36.0)
MCV: 89.5 fl (ref 78.0–100.0)
Platelets: 266 10*3/uL (ref 150.0–400.0)
RBC: 3.76 Mil/uL — ABNORMAL LOW (ref 3.87–5.11)
RDW: 18.6 % — ABNORMAL HIGH (ref 11.5–15.5)
WBC: 8.2 10*3/uL (ref 4.0–10.5)

## 2016-06-08 LAB — LIPID PANEL
Cholesterol: 124 mg/dL (ref 0–200)
HDL: 44.8 mg/dL (ref >39.00)
LDL Cholesterol: 43 mg/dL (ref 0–99)
NonHDL: 79.57
Total CHOL/HDL Ratio: 3
Triglycerides: 183 mg/dL — ABNORMAL HIGH (ref 0.0–149.0)
VLDL: 36.6 mg/dL (ref 0.0–40.0)

## 2016-06-08 NOTE — Patient Instructions (Signed)
Continue your meds.  Follow up in 3 months.  Take care  Dr. Akshitha Culmer  

## 2016-06-08 NOTE — Progress Notes (Signed)
Pre visit review using our clinic review tool, if applicable. No additional management support is needed unless otherwise documented below in the visit note. 

## 2016-06-09 NOTE — Progress Notes (Signed)
Subjective:  Patient ID: Kerri Carter, female    DOB: 11-27-1940  Age: 76 y.o. MRN: [id number]  CC: Hospital/rehab follow up  HPI:  76 year old female with an extensive past medical history presents for follow-up following a recent admission.  Patient recently admitted from 4/5 - 4/12. Patient presented with abdominal pain. She was found to have acute gallstone pancreatitis and choledocholithiasis. Patient underwent MRCP and stones were removed. She subsequently underwent left prescribed a cholecystectomy and did well. Patient was discharged to rehabilitation for a brief stay.   Patient presents today for follow-up. She states that she's doing well at this time. She has seen her surgeon recently, 4/26.   Patient has no complaints at this time. No abdominal pain. She endorses compliance with her medications. Her medications were reviewed and reconciled today.  Social Hx   Social History   Social History  . Marital status: Married    Spouse name: N/A  . Number of children: 2  . Years of education: Some Coll   Occupational History  . Retired    Social History Main Topics  . Smoking status: Former Smoker    Packs/day: 0.50    Years: 20.00    Types: Cigarettes    Quit date: 02/02/1974  . Smokeless tobacco: Never Used  . Alcohol use No  . Drug use: No  . Sexual activity: Not Currently   Other Topics Concern  . None   Social History Narrative   Lives at home in [place] with her husband and grandson.   Right-handed.   6 cups coffee per day.    Review of Systems  Constitutional: Negative.   Gastrointestinal: Negative.    Objective:  BP 138/64   Pulse 80   Temp 98.1 F (36.7 C) (Oral)   Wt 165 lb 6 oz (75 kg)   SpO2 93%   BMI 28.39 kg/m   BP/Weight 06/08/2016 05/28/2016 8/52/7782  Systolic BP 423 536 144  Diastolic BP 64 59 61  Wt. (Lbs) 165.38 159 171.4  BMI 28.39 27.29 29.42    Physical Exam  Constitutional: She appears well-developed. No distress.    Cardiovascular: Normal rate and regular rhythm.   Murmur heard. Pulmonary/Chest: Effort normal. She has no wheezes. She has no rales.  Abdominal: Soft. She exhibits no distension. There is no tenderness. There is no rebound and no guarding.  Wounds from cholecystectomy are healing well.   Neurological: She is alert.  Psychiatric: She has a normal mood and affect.  Vitals reviewed.   Lab Results  Component Value Date   WBC 8.2 06/08/2016   HGB 10.8 (L) 06/08/2016   HCT 33.6 (L) 06/08/2016   PLT 266.0 06/08/2016   GLUCOSE 125 (H) 06/08/2016   CHOL 124 06/08/2016   TRIG 183.0 (H) 06/08/2016   HDL 44.80 06/08/2016   LDLCALC 43 06/08/2016   ALT 9 06/08/2016   AST 17 06/08/2016   NA 140 06/08/2016   K 4.1 06/08/2016   CL 102 06/08/2016   CREATININE 1.43 (H) 06/08/2016   BUN 18 06/08/2016   CO2 30 06/08/2016   TSH 3.441 05/07/2016   INR 0.9 07/03/2013   HGBA1C 5.7 (H) 05/07/2016    Assessment & Plan:   Problem List Items Addressed This Visit    Anemia   Relevant Orders   CBC (Completed)   Essential hypertension   Relevant Orders   Comprehensive metabolic panel (Completed)   Gallstone pancreatitis - Primary    Doing well at this time. s/p  ERCP and Cholecystectomy.  She has followed-up with her surgeon as recommended. Her medications were reviewed and reconciled today. Labs today.      Hyperlipidemia   Relevant Orders   Lipid panel (Completed)     Follow-up: Return in about 3 months (around 09/08/2016).  [place]

## 2016-06-09 NOTE — Assessment & Plan Note (Addendum)
Doing well at this time. s/p ERCP and Cholecystectomy.  She has followed-up with her surgeon as recommended. Her medications were reviewed and reconciled today. Labs today.

## 2016-06-12 ENCOUNTER — Emergency Department: Payer: Medicare Other

## 2016-06-12 ENCOUNTER — Encounter: Payer: Self-pay | Admitting: *Deleted

## 2016-06-12 ENCOUNTER — Telehealth: Payer: Self-pay | Admitting: Family Medicine

## 2016-06-12 ENCOUNTER — Emergency Department
Admission: EM | Admit: 2016-06-12 | Discharge: 2016-06-13 | Disposition: A | Discharge status: Home / Self Care | Payer: Medicare Other | Attending: Emergency Medicine | Admitting: Emergency Medicine

## 2016-06-12 DIAGNOSIS — J449 Chronic obstructive pulmonary disease, unspecified: Secondary | ICD-10-CM | POA: Insufficient documentation

## 2016-06-12 DIAGNOSIS — J45909 Unspecified asthma, uncomplicated: Secondary | ICD-10-CM | POA: Diagnosis not present

## 2016-06-12 DIAGNOSIS — R1012 Left upper quadrant pain: Secondary | ICD-10-CM | POA: Diagnosis present

## 2016-06-12 DIAGNOSIS — I129 Hypertensive chronic kidney disease with stage 1 through stage 4 chronic kidney disease, or unspecified chronic kidney disease: Secondary | ICD-10-CM | POA: Diagnosis not present

## 2016-06-12 DIAGNOSIS — N183 Chronic kidney disease, stage 3 (moderate): Secondary | ICD-10-CM | POA: Diagnosis not present

## 2016-06-12 DIAGNOSIS — Z87891 Personal history of nicotine dependence: Secondary | ICD-10-CM | POA: Diagnosis not present

## 2016-06-12 DIAGNOSIS — N390 Urinary tract infection, site not specified: Secondary | ICD-10-CM | POA: Diagnosis not present

## 2016-06-12 DIAGNOSIS — E1122 Type 2 diabetes mellitus with diabetic chronic kidney disease: Secondary | ICD-10-CM | POA: Diagnosis not present

## 2016-06-12 LAB — COMPREHENSIVE METABOLIC PANEL
ALT: 15 U/L (ref 14–54)
AST: 25 U/L (ref 15–41)
Albumin: 3.5 g/dL (ref 3.5–5.0)
Alkaline Phosphatase: 96 U/L (ref 38–126)
Anion gap: 8 (ref 5–15)
BUN: 16 mg/dL (ref 6–20)
CO2: 26 mmol/L (ref 22–32)
Calcium: 8.8 mg/dL — ABNORMAL LOW (ref 8.9–10.3)
Chloride: 101 mmol/L (ref 101–111)
Creatinine, Ser: 1.28 mg/dL — ABNORMAL HIGH (ref 0.44–1.00)
GFR calc Af Amer: 47 mL/min — ABNORMAL LOW (ref >60)
GFR calc non Af Amer: 40 mL/min — ABNORMAL LOW (ref >60)
Glucose, Bld: 116 mg/dL — ABNORMAL HIGH (ref 65–99)
Potassium: 4 mmol/L (ref 3.5–5.1)
Sodium: 135 mmol/L (ref 135–145)
Total Bilirubin: 0.9 mg/dL (ref 0.3–1.2)
Total Protein: 7.2 g/dL (ref 6.5–8.1)

## 2016-06-12 LAB — CBC
HCT: 34 % — ABNORMAL LOW (ref 35.0–47.0)
Hemoglobin: 11.2 g/dL — ABNORMAL LOW (ref 12.0–16.0)
MCH: 29 pg (ref 26.0–34.0)
MCHC: 32.9 g/dL (ref 32.0–36.0)
MCV: 88 fL (ref 80.0–100.0)
Platelets: 307 10*3/uL (ref 150–440)
RBC: 3.87 MIL/uL (ref 3.80–5.20)
RDW: 18.7 % — ABNORMAL HIGH (ref 11.5–14.5)
WBC: 11.4 10*3/uL — ABNORMAL HIGH (ref 3.6–11.0)

## 2016-06-12 LAB — URINALYSIS, COMPLETE (UACMP) WITH MICROSCOPIC
Bilirubin Urine: NEGATIVE
Glucose, UA: NEGATIVE mg/dL
Hgb urine dipstick: NEGATIVE
Ketones, ur: NEGATIVE mg/dL
Nitrite: NEGATIVE
Protein, ur: NEGATIVE mg/dL
Specific Gravity, Urine: 1.012 (ref 1.005–1.030)
pH: 6 (ref 5.0–8.0)

## 2016-06-12 LAB — LIPASE, BLOOD: Lipase: 37 U/L (ref 11–51)

## 2016-06-12 MED ORDER — HYDROCORTISONE NA SUCCINATE PF 100 MG IJ SOLR
INTRAMUSCULAR | Status: AC
  Administered 2016-06-12: 200 mg via INTRAVENOUS
  Filled 2016-06-12: qty 4

## 2016-06-12 MED ORDER — OXYCODONE-ACETAMINOPHEN 5-325 MG PO TABS: 1.0000 | ORAL_TABLET | Freq: Four times a day (QID) | ORAL | 0 refills | Status: DC | PRN

## 2016-06-12 MED ORDER — DIPHENHYDRAMINE HCL 50 MG/ML IJ SOLN
25.0000 mg | Freq: Once | INTRAMUSCULAR | Status: AC
  Administered 2016-06-12: 25 mg via INTRAVENOUS
  Filled 2016-06-12: qty 1

## 2016-06-12 MED ORDER — SODIUM CHLORIDE 0.9 % IV SOLN
Freq: Once | INTRAVENOUS | Status: AC
  Administered 2016-06-12: 1000 mL via INTRAVENOUS

## 2016-06-12 MED ORDER — HYDROCORTISONE NA SUCCINATE PF 250 MG IJ SOLR
200.0000 mg | Freq: Once | INTRAMUSCULAR | Status: AC
  Administered 2016-06-12: 200 mg via INTRAVENOUS

## 2016-06-12 MED ORDER — MORPHINE SULFATE (PF) 4 MG/ML IV SOLN
4.0000 mg | Freq: Once | INTRAVENOUS | Status: AC
  Administered 2016-06-12: 4 mg via INTRAVENOUS
  Filled 2016-06-12: qty 1

## 2016-06-12 MED ORDER — IOPAMIDOL (ISOVUE-300) INJECTION 61%
75.0000 mL | Freq: Once | INTRAVENOUS | Status: AC | PRN
  Administered 2016-06-12: 75 mL via INTRAVENOUS

## 2016-06-12 MED ORDER — ONDANSETRON HCL 4 MG/2ML IJ SOLN
4.0000 mg | Freq: Once | INTRAMUSCULAR | Status: AC
  Administered 2016-06-12: 4 mg via INTRAVENOUS
  Filled 2016-06-12: qty 2

## 2016-06-12 MED ORDER — CEFTRIAXONE SODIUM IN DEXTROSE 20 MG/ML IV SOLN: 1.0000 g | Freq: Once | INTRAVENOUS | Status: DC

## 2016-06-12 MED ORDER — CIPROFLOXACIN HCL 500 MG PO TABS
500.0000 mg | ORAL_TABLET | Freq: Once | ORAL | Status: AC
  Administered 2016-06-12: 500 mg via ORAL
  Filled 2016-06-12: qty 1

## 2016-06-12 MED ORDER — CIPROFLOXACIN HCL 250 MG PO TABS: 250.0000 mg | ORAL_TABLET | Freq: Two times a day (BID) | ORAL | 0 refills | Status: AC

## 2016-06-12 MED ORDER — IOPAMIDOL (ISOVUE-300) INJECTION 61%
30.0000 mL | Freq: Once | INTRAVENOUS | Status: AC
  Administered 2016-06-12: 30 mL via ORAL

## 2016-06-12 NOTE — ED Notes (Addendum)
Patient unable to void, she says she will try again if the urge comes

## 2016-06-12 NOTE — ED Notes (Signed)
Patient transported to CT 

## 2016-06-12 NOTE — Telephone Encounter (Signed)
With pain under left breast and diminished lung sounds advised home health nurse patient needs to be evaluated at ER now. Nurse advised she will have patient go toER.

## 2016-06-12 NOTE — ED Notes (Signed)
ED Provider at bedside. 

## 2016-06-12 NOTE — Telephone Encounter (Signed)
Shiela [phone] called from [facility] home care regarding pt is complaining sharp pain under her left breast when she breaths and moves. Lungs sound dimished in lower lope all over vital are fine. Temp was 99.2 Pt is nauseated and cannot eat anything. Thank you! VM can be left if no answer.

## 2016-06-12 NOTE — ED Triage Notes (Signed)
States pain in her upper left abd under her left breast that radiates to her back, states nausea, awake and alert in no acute distress

## 2016-06-12 NOTE — ED Provider Notes (Addendum)
[facility] Emergency Department Provider Note       Time seen: ----------------------------------------- 7:34 PM on 06/12/2016 -----------------------------------------     I have reviewed the triage vital signs and the nursing notes.   HISTORY   Chief Complaint Abdominal Pain    HPI Kerri Carter is a 76 y.o. female who presents to the ED for pain in the left side of the abdomen. Pain onset was today, seems to be worse in the left upper quadrant. She's had associated nausea and some radiation to her back. She denies fevers, chills, diarrhea or other complaints. She's never had this happen before.   Past Medical History:  Diagnosis Date  . Anxiety   . Asthma   . Chicken pox   . CKD (chronic kidney disease), stage III    a. s/p R nephrectomy.  . Conversion disorder   . COPD (chronic obstructive pulmonary disease) ([place])   . Depression   . Diabetes mellitus without complication ([place])    Patient denies Diabetes, does not take any diabetic medications  . Essential hypertension   . GERD (gastroesophageal reflux disease)   . Hyperlipidemia   . Inflammatory arthritis    a. hands/carpal tunnel.  b. Low titer rheumatoid factor. c. Negative anti-CCP antibodies. d. Plaquenil.  . Non-Obstructive CAD    a. 07/2009 Cath ([facility]): nonobs dzs;  b. 03/2011 Cath [facility]): nonobs dzs.  . Osteoarthritis    a. Knees.  . PUD (peptic ulcer disease)   . Toxic maculopathy   . Valvular heart disease    a. 07/2015 Echo: EF 55-60%, Mild AI, AS, MR, and TR.    Patient Active Problem List   Diagnosis Date Noted  . Gallstone pancreatitis 05/07/2016  . Calculus of gallbladder and bile duct with obstruction without cholecystitis   . Mixed bipolar I disorder ([place]) 10/09/2015  . CKD (chronic kidney disease), stage III   . Essential hypertension   . Anemia 08/15/2015  . Conversion disorder 08/04/2015  . Anxiety and depression 07/17/2015  . Hyperlipidemia 07/17/2015  .  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 Macular degeneration 07/17/2015  . Insomnia 07/17/2015  . Rheumatoid arthritis ([place]) 12/05/2014  . Valvular heart disease 10/13/2011  . COPD (chronic obstructive pulmonary disease) ([place]) 09/09/2011  . OSA (obstructive sleep apnea) 09/09/2011  . Nocturnal hypoxemia 09/09/2011  . Gastroesophageal reflux disease 02/24/2011    Past Surgical History:  Procedure Laterality Date  . APPENDECTOMY    . BUNIONECTOMY    . CESAREAN SECTION     x1  . CHOLECYSTECTOMY N/A 05/11/2016   Procedure: LAPAROSCOPIC CHOLECYSTECTOMY;  Surgeon: Florene Glen, MD;  Location: [facility] ORS;  Service: General;  Laterality: N/A;  . COLONOSCOPY WITH PROPOFOL N/A 04/02/2016   Procedure: COLONOSCOPY WITH PROPOFOL;  Surgeon: Jonathon Bellows, MD;  Location: [facility] ENDOSCOPY;  Service: Endoscopy;  Laterality: N/A;  . ENDOSCOPIC RETROGRADE CHOLANGIOPANCREATOGRAPHY (ERCP) WITH PROPOFOL N/A 05/08/2016   Procedure: ENDOSCOPIC RETROGRADE CHOLANGIOPANCREATOGRAPHY (ERCP) WITH PROPOFOL;  Surgeon: Lucilla Lame, MD;  Location: [facility] ENDOSCOPY;  Service: Endoscopy;  Laterality: N/A;  . ESOPHAGEAL DILATION  04/02/2016   Procedure: ESOPHAGEAL DILATION;  Surgeon: Jonathon Bellows, MD;  Location: [facility] ENDOSCOPY;  Service: Endoscopy;;  . ESOPHAGOGASTRODUODENOSCOPY (EGD) WITH PROPOFOL N/A 04/02/2016   Procedure: ESOPHAGOGASTRODUODENOSCOPY (EGD) WITH PROPOFOL;  Surgeon: Jonathon Bellows, MD;  Location: [facility] ENDOSCOPY;  Service: Endoscopy;  Laterality: N/A;  . 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 TONSILLECTOMY    .  TOTAL HIP ARTHROPLASTY  12/10/11   [facility] left hip  . TUBAL LIGATION      Allergies Ceftin [cefuroxime axetil]; Lisinopril; Antihistamines, chlorpheniramine-type; Antivert [meclizine hcl]; Decongestant [pseudoephedrine hcl]; Doxycycline; Morphine and related; Polymyxin b; Sulfa antibiotics; Xarelto [rivaroxaban]; Adhesive [tape]; Iodine;  Levaquin [levofloxacin in d5w]; and Tetanus toxoids  Social History Social History  Substance Use Topics  . Smoking status: Former Smoker    Packs/day: 0.50    Years: 20.00    Types: Cigarettes    Quit date: 02/02/1974  . Smokeless tobacco: Never Used  . Alcohol use No    Review of Systems Constitutional: Negative for fever. Eyes: Negative for vision changes ENT:  Negative for congestion, sore throat Cardiovascular: Negative for chest pain. Respiratory: Negative for shortness of breath. Gastrointestinal: Positive for left-sided abdominal pain, nausea Genitourinary: Negative for dysuria. Musculoskeletal: Positive for back pain Skin: Negative for rash. Neurological: Negative for headaches, focal weakness or numbness.  All systems negative/normal/unremarkable except as stated in the HPI  ____________________________________________   PHYSICAL EXAM:  VITAL SIGNS: ED Triage Vitals  Enc Vitals Group     BP 06/12/16 1811 (!) 126/52     Pulse Rate 06/12/16 1811 84     Resp 06/12/16 1811 16     Temp 06/12/16 1811 99.8 F (37.7 C)     Temp Source 06/12/16 1811 Oral     SpO2 06/12/16 1811 96 %     Weight 06/12/16 1812 165 lb (74.8 kg)     Height 06/12/16 1812 5\' 4"  (1.626 m)     Head Circumference --      Peak Flow --      Pain Score 06/12/16 1811 10     Pain Loc --      Pain Edu? --      Excl. in [place]? --     Constitutional: Alert and oriented. Mild distress Eyes: Conjunctivae are normal. PERRL. Normal extraocular movements. ENT   Head: Normocephalic and atraumatic.   Nose: No congestion/rhinnorhea.   Mouth/Throat: Mucous membranes are moist.   Neck: No stridor. Cardiovascular: Normal rate, regular rhythm. No murmurs, rubs, or gallops. Respiratory: Normal respiratory effort without tachypnea nor retractions. Breath sounds are clear and equal bilaterally. No wheezes/rales/rhonchi. Gastrointestinal: Left upper quadrant tenderness, no rebound or guarding.  Normal bowel sounds. Musculoskeletal: Nontender with normal range of motion in extremities. No lower extremity tenderness nor edema. Neurologic:  Normal speech and language. No gross focal neurologic deficits are appreciated.  Skin:  Skin is warm, dry and intact. No rash noted. Psychiatric: Mood and affect are normal. Speech and behavior are normal.  ____________________________________________  ED COURSE:  EKG: Interpreted by me, sinus rhythm rate 84 bpm, normal PR interval, normal QRS, normal QT.  Pertinent labs & imaging results that were available during my care of the patient were reviewed by me and considered in my medical decision making (see chart for details). Patient presents for abdominal pain, we will assess with labs and imaging as indicated.   Procedures ____________________________________________   LABS (pertinent positives/negatives)  Labs Reviewed  COMPREHENSIVE METABOLIC PANEL - Abnormal; Notable for the following:       Result Value   Glucose, Bld 116 (*)    Creatinine, Ser 1.28 (*)    Calcium 8.8 (*)    GFR calc non Af Amer 40 (*)    GFR calc Af Amer 47 (*)    All other components within normal limits  CBC - Abnormal; Notable for the following:  WBC 11.4 (*)    Hemoglobin 11.2 (*)    HCT 34.0 (*)    RDW 18.7 (*)    All other components within normal limits  URINALYSIS, COMPLETE (UACMP) WITH MICROSCOPIC - Abnormal; Notable for the following:    Color, Urine YELLOW (*)    APPearance HAZY (*)    Leukocytes, UA TRACE (*)    Bacteria, UA FEW (*)    Squamous Epithelial / LPF 0-5 (*)    All other components within normal limits  LIPASE, BLOOD    RADIOLOGY Images were viewed by me  CT renal protocol IMPRESSION: 1. No acute finding to explain left flank pain. No hydronephrosis or urolithiasis of the solitary left kidney. 2. Fat reticulation in the right upper quadrant. If asymptomatic this is presumably residual postsurgical changes  from cholecystectomy 05/11/2016. No evidence of fluid collection. 3. Formed stool throughout the redundant colon. 4. Chronic findings are described above.  IMPRESSION: 1. No findings to explain left upper quadrant pain. 2. Fat stranding in the right upper quadrant of the abdomen. Given lack of right-sided abdominal symptoms likely residual postsurgical change from recent cholecystectomy. No intra-abdominal abscess. 3. Additional stable chronic findings as described. ____________________________________________  FINAL ASSESSMENT AND PLAN  Flank pain  Plan: Patient's labs and imaging were dictated above. Patient had presented for Flank pain worse in the left upper quadrant. No specific etiology was discovered. She'll be discharged with pain medicine and close outpatient follow-up.   Earleen Newport, MD   Note: This note was generated in part or whole with voice recognition software. Voice recognition is usually quite accurate but there are transcription errors that can and very often do occur. I apologize for any typographical errors that were not detected and corrected.     Earleen Newport, MD 06/12/16 6629    Earleen Newport, MD 06/12/16 [phone]

## 2016-06-15 ENCOUNTER — Telehealth: Payer: Self-pay | Admitting: *Deleted

## 2016-06-15 NOTE — Telephone Encounter (Signed)
V/O okay to give? 

## 2016-06-15 NOTE — Telephone Encounter (Signed)
Yes

## 2016-06-15 NOTE — Telephone Encounter (Signed)
Tesher from [facility] has requested verbal order for home health physical therapy -2 times a week for  2 week  Contact [phone]

## 2016-06-16 NOTE — Telephone Encounter (Signed)
tesher called and V/O were given.

## 2016-06-19 IMAGING — CT NM BONE W/ SPECT
1 series · 12 of 14 positions shown, 15 images · non-contrast
Comparison: 04/09/2014

CLINICAL DATA: Total knee arthroplasty in 7066.  Pain.

EXAM:
NUCLEAR MEDICINE 3-PHASE BONE SCAN
TECHNIQUE: Radionuclide angiographic images, immediate static blood pool
images, and 3-hour delayed static images were obtained of the after
intravenous injection of radiopharmaceutical.
RADIOPHARMACEUTICALS:  21.7 mCi Zc-55m MDP

[Series 4: 3d bone 1.25 b70s · axial · 0.98mm/px · z∈[+692,+1018]mm · 12 of 551 slices shown, 15 images]
[im 43/551  soft-tissue]
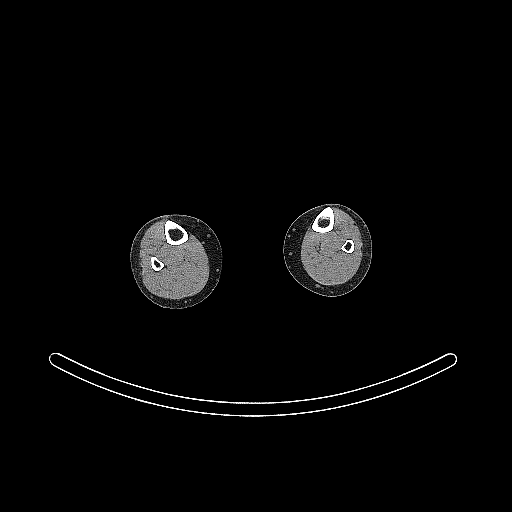
[im 43/551  bone]
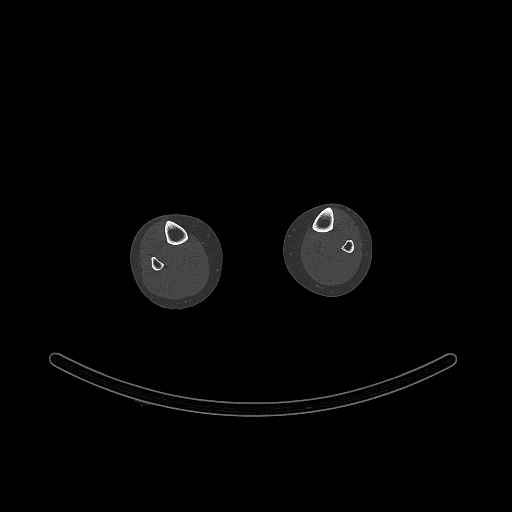
[im 85/551  bone]
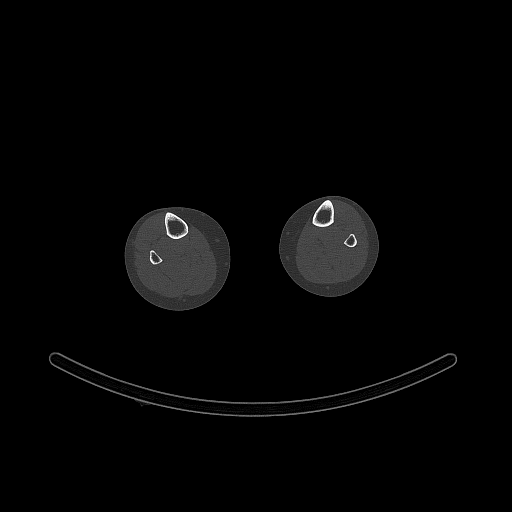
[im 127/551  bone]
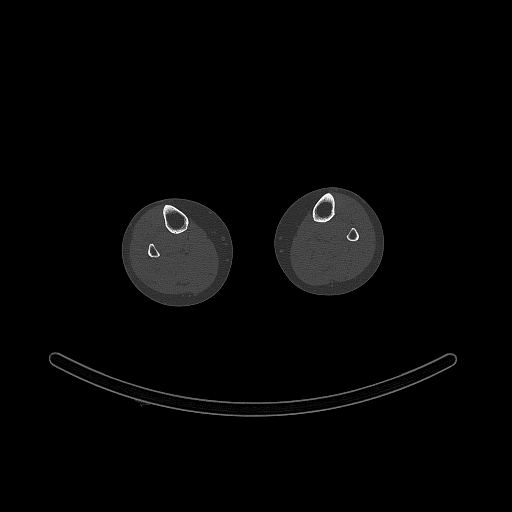
[im 170/551  bone]
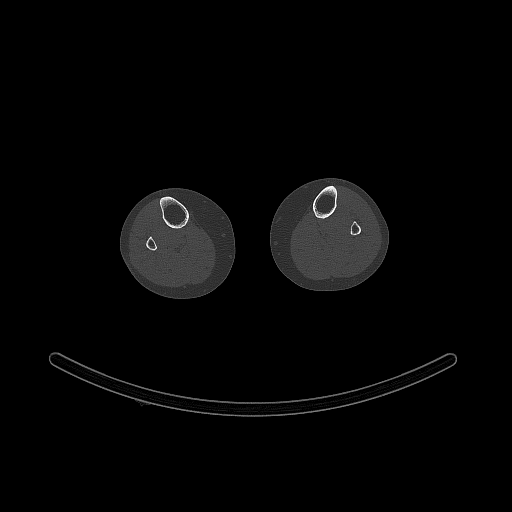
[im 212/551  soft-tissue]
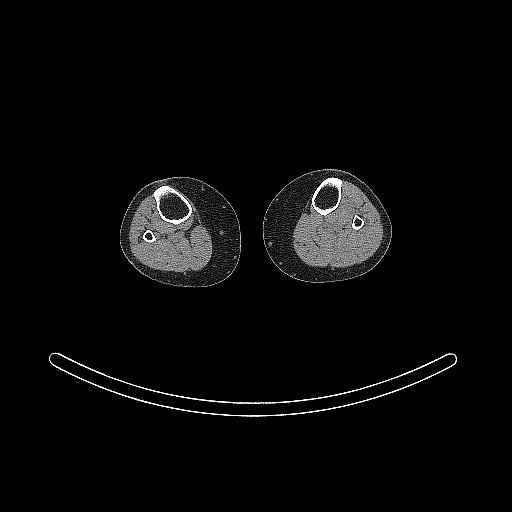
[im 212/551  bone]
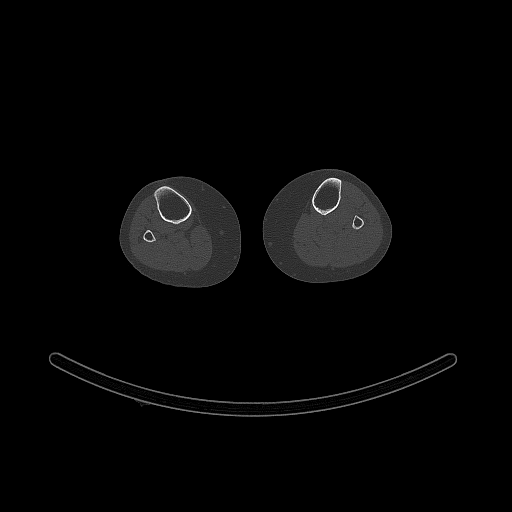
[im 254/551  bone]
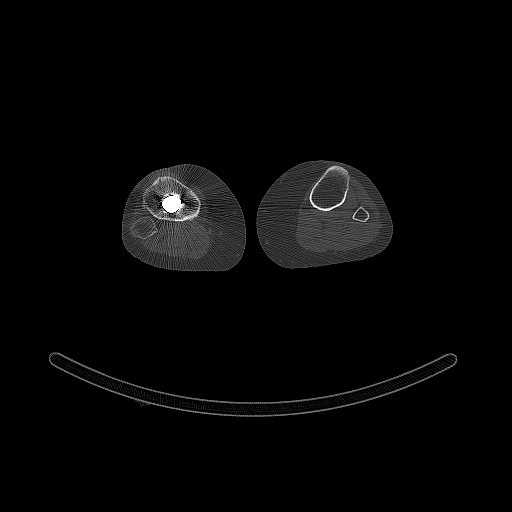
[im 297/551  bone]
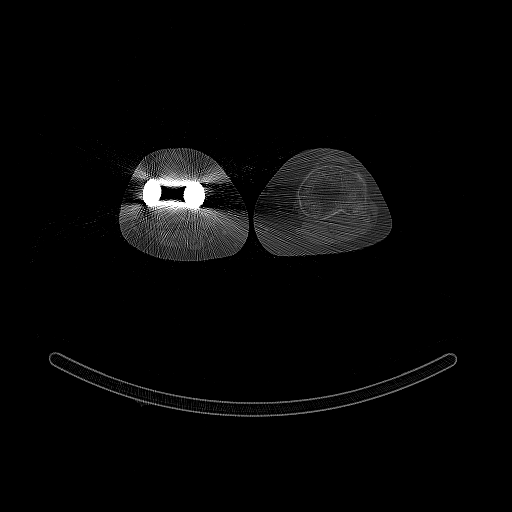
[im 339/551  bone]
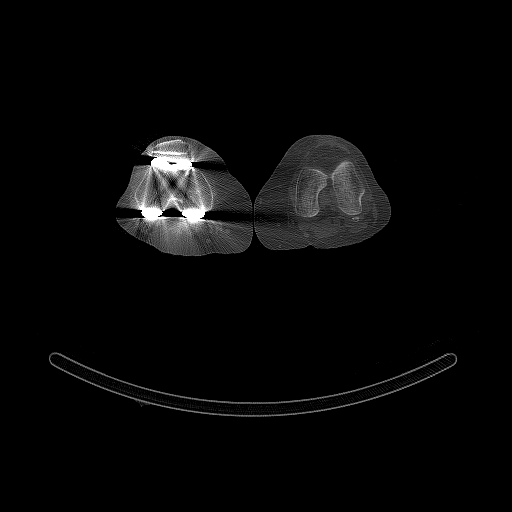
[im 381/551  soft-tissue]
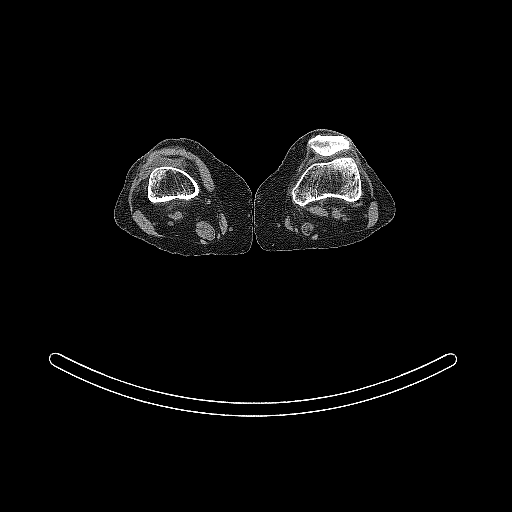
[im 381/551  bone]
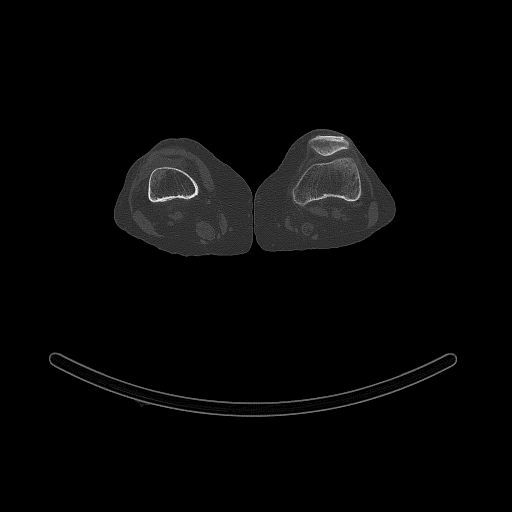
[im 424/551  bone]
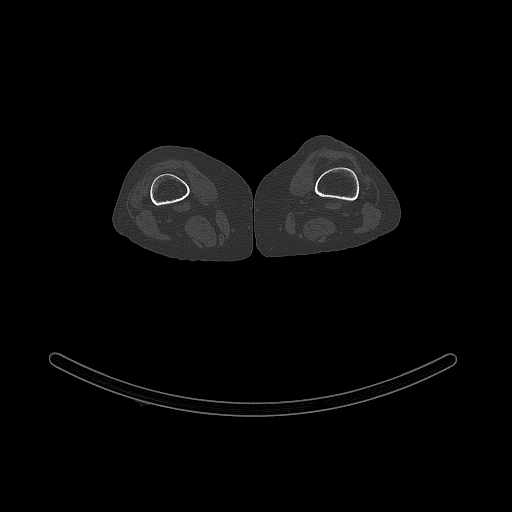
[im 466/551  bone]
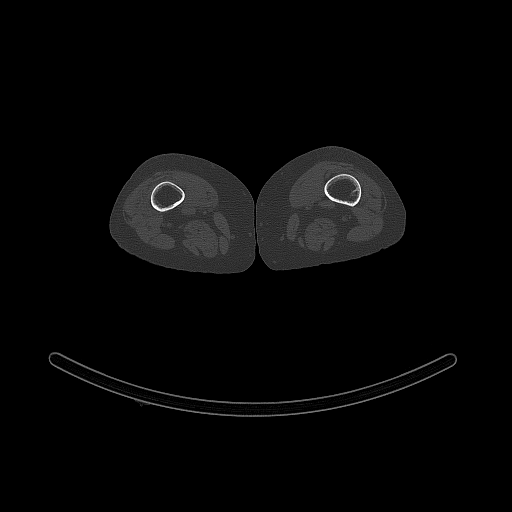
[im 508/551  bone]
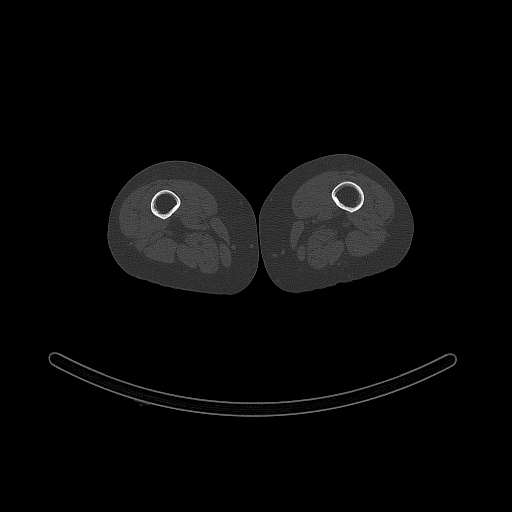

[12 of 14 positions shown; findings below may reference images not displayed]

FINDINGS: Vascular phase: No asymmetric or increased blood flow to the left or
right knee.

Blood pool phase: There is increased blood pool activity within the
joint capsule on the right. This is similar to the previous exam.

Delayed phase: There is delayed uptake within the right knee
primarily within the tibial plateau and to lesser degree the lateral
femoral condyle. Also similar to previous exam.
IMPRESSION: 1. No specific findings identified to suggest loosening or infection
of right knee prosthesis.
2. Similar appearance of asymmetric increased uptake within the
right joint capsule on blood pool phase images which may reflect
bursitis/synovitis.
3. Delayed phase uptake localizing to the right tibial plateau which
may reflect bone remodeling.

## 2016-07-07 ENCOUNTER — Telehealth: Payer: Self-pay | Admitting: Family Medicine

## 2016-07-07 ENCOUNTER — Other Ambulatory Visit: Payer: Self-pay | Admitting: Family Medicine

## 2016-07-07 NOTE — Telephone Encounter (Signed)
Kerri Carter from [facility] home care called and stated that pt is c/o right lower extremity pain for several weeks and it has progressively gotten worse. On some days the pain is 10/10. The pain radiates to her rt hip and rt lower back. Pt has always denied diagnosis of diabetes but pt is not having diabetic episodes of cold symptoms. Pt has been testing her blood sugar. She had a fasting blood sugar of 199, after lunch it was 151, and last reading was 1231. Would you like Kerri Carter to run and A1c? Please advise, thank you!  Call [facility] @ [phone]

## 2016-07-07 NOTE — Telephone Encounter (Signed)
Patient requesting refill on medication that is historical for you, filled previously by behavioral health. Please advise.

## 2016-07-07 NOTE — Telephone Encounter (Signed)
Please call and clarify this information, because some is definitely inaccurate.

## 2016-07-07 NOTE — Telephone Encounter (Signed)
Left voice mail to call back for [facility]

## 2016-07-07 NOTE — Telephone Encounter (Signed)
Spoke with Freda Munro from [facility] [phone]  to clarify below message patient is having right lower extremity pain in right hip radiating down right leg pain is worsened when she walks.   Patient has been checking blood sugar and fasting blood sugar, after lunch 151, last reading 131.   Wondering if you want Shelia to check A1C.   She was last seen in ED for flank pain and UTI on 06/12/16.  No follow up appointment on file. Nov 09/08/16 . Please advise.

## 2016-07-07 NOTE — Telephone Encounter (Signed)
Last a1c was in April. 5.7. Can't have A1C.

## 2016-07-08 NOTE — Telephone Encounter (Signed)
Left voice mail to call back for patient needs to schedule appointment

## 2016-07-08 NOTE — Telephone Encounter (Signed)
Called and spoke with Kerri Carter advised per Dr Lacinda Axon patient had A1C done in April, not due for now.   Advised would call patient would need to be seen for appointment for leg/hip pain.  She inquired about blood sugars going up and down.  I advised if symptoms continue she would need to come in for appointment for this as well. If symptoms worsen prior to appointment would need to go to ER or urgent care.

## 2016-07-10 NOTE — Telephone Encounter (Signed)
Patient coming in 07/13/16 for follow up

## 2016-07-13 ENCOUNTER — Ambulatory Visit: Payer: Medicare Other | Admitting: Family Medicine

## 2016-07-13 ENCOUNTER — Ambulatory Visit: Payer: Medicare Other | Admitting: Psychiatry

## 2016-07-17 ENCOUNTER — Encounter: Payer: Self-pay | Admitting: Family Medicine

## 2016-07-17 ENCOUNTER — Ambulatory Visit (INDEPENDENT_AMBULATORY_CARE_PROVIDER_SITE_OTHER): Payer: Medicare Other | Admitting: Family Medicine

## 2016-07-17 DIAGNOSIS — I10 Essential (primary) hypertension: Secondary | ICD-10-CM

## 2016-07-17 DIAGNOSIS — R600 Localized edema: Secondary | ICD-10-CM | POA: Diagnosis not present

## 2016-07-17 DIAGNOSIS — E785 Hyperlipidemia, unspecified: Secondary | ICD-10-CM | POA: Diagnosis not present

## 2016-07-17 NOTE — Patient Instructions (Signed)
Follow up with Dr. Rudene Christians and Cardiology.   No salt.  Try and be active.  Follow up in 3 months.  Take care  Dr. Lacinda Axon

## 2016-07-20 NOTE — Assessment & Plan Note (Signed)
New problem. No evidence of CHF. Secondary to inactivity and venous stasis. Advised compression/elevation.

## 2016-07-20 NOTE — Progress Notes (Signed)
Subjective:  Patient ID: Kerri Carter, female    DOB: 20-Oct-1940  Age: 76 y.o. MRN: [id number]  CC: Follow up, feet swelling  HPI:  76 -year-old female with an extensive past medical history presents for follow-up.  Hypertension  Stable on Bystolic, Lasix.  HLD  At goal on Lovastatin.  Leg edema  Patient reports ongoing lower extremity edema.  Worse in the evening.  Compliant with lasix.  No chest pain or SOB.  Social Hx   Social History   Social History  . Marital status: Married    Spouse name: N/A  . Number of children: 2  . Years of education: Some Coll   Occupational History  . Retired    Social History Main Topics  . Smoking status: Former Smoker    Packs/day: 0.50    Years: 20.00    Types: Cigarettes    Quit date: 02/02/1974  . Smokeless tobacco: Never Used  . Alcohol use No  . Drug use: No  . Sexual activity: Not Currently   Other Topics Concern  . None   Social History Narrative   Lives at home in [place] with her husband and grandson.   Right-handed.   6 cups coffee per day.    Review of Systems  Cardiovascular: Positive for leg swelling.  Musculoskeletal: Positive for arthralgias.   Objective:  BP 134/64 (BP Location: Left Arm, Patient Position: Sitting, Cuff Size: Normal)   Pulse 75   Temp 98.1 F (36.7 C) (Oral)   Resp 12   Wt 167 lb 6 oz (75.9 kg)   SpO2 95%   BMI 28.73 kg/m   BP/Weight 07/17/2016 07/16/4313 4/0/0867  Systolic BP 619 509 326  Diastolic BP 64 52 64  Wt. (Lbs) 167.38 165 165.38  BMI 28.73 28.32 28.39  Some encounter information is confidential and restricted. Go to Review Flowsheets activity to see all data.   Physical Exam  Constitutional: She is oriented to person, place, and time. She appears well-developed. No distress.  Cardiovascular: Normal rate and regular rhythm.   1+ LE edema bilaterally. Varicose veins noted.  Pulmonary/Chest: Effort normal and breath sounds normal.  Neurological: She is  alert and oriented to person, place, and time.  Psychiatric: She has a normal mood and affect.  Vitals reviewed.   Lab Results  Component Value Date   WBC 11.4 (H) 06/12/2016   HGB 11.2 (L) 06/12/2016   HCT 34.0 (L) 06/12/2016   PLT 307 06/12/2016   GLUCOSE 116 (H) 06/12/2016   CHOL 124 06/08/2016   TRIG 183.0 (H) 06/08/2016   HDL 44.80 06/08/2016   LDLCALC 43 06/08/2016   ALT 15 06/12/2016   AST 25 06/12/2016   NA 135 06/12/2016   K 4.0 06/12/2016   CL 101 06/12/2016   CREATININE 1.28 (H) 06/12/2016   BUN 16 06/12/2016   CO2 26 06/12/2016   TSH 3.441 05/07/2016   INR 0.9 07/03/2013   HGBA1C 5.7 (H) 05/07/2016    Assessment & Plan:   Problem List Items Addressed This Visit    Leg edema    New problem. No evidence of CHF. Secondary to inactivity and venous stasis. Advised compression/elevation.      Hyperlipidemia    At goal on Lovastatin. Continue.      Essential hypertension    Stable. Continue Bystolic and Lasix.         Meds ordered this encounter  Medications  . docusate (COLACE) 50 MG/5ML liquid    Sig: Take  by mouth daily.    Follow-up: 3 months  [place]

## 2016-07-20 NOTE — Assessment & Plan Note (Signed)
At goal on Lovastatin. Continue.

## 2016-07-20 NOTE — Assessment & Plan Note (Signed)
Stable. Continue Bystolic and Lasix.

## 2016-07-28 ENCOUNTER — Other Ambulatory Visit: Payer: Self-pay | Admitting: Pulmonary Disease

## 2016-07-28 ENCOUNTER — Other Ambulatory Visit: Payer: Self-pay | Admitting: Orthopedic Surgery

## 2016-07-28 DIAGNOSIS — M9963 Osseous and subluxation stenosis of intervertebral foramina of lumbar region: Secondary | ICD-10-CM

## 2016-07-29 ENCOUNTER — Encounter: Payer: Self-pay | Admitting: Psychiatry

## 2016-07-29 ENCOUNTER — Ambulatory Visit (INDEPENDENT_AMBULATORY_CARE_PROVIDER_SITE_OTHER): Payer: 59 | Admitting: Psychiatry

## 2016-07-29 ENCOUNTER — Telehealth: Payer: Self-pay

## 2016-07-29 VITALS — BP 126/62 | HR 73 | Temp 98.5°F | Wt 167.2 lb

## 2016-07-29 DIAGNOSIS — F3160 Bipolar disorder, current episode mixed, unspecified: Secondary | ICD-10-CM

## 2016-07-29 MED ORDER — ESCITALOPRAM OXALATE 10 MG PO TABS
10.0000 mg | ORAL_TABLET | Freq: Every day | ORAL | 2 refills | Status: DC
Start: 2016-07-29 — End: 2016-12-21

## 2016-07-29 MED ORDER — QUETIAPINE FUMARATE 25 MG PO TABS: 25.0000 mg | ORAL_TABLET | Freq: Every day | ORAL | 2 refills | Status: DC

## 2016-07-29 MED ORDER — LAMOTRIGINE 100 MG PO TABS: 100.0000 mg | ORAL_TABLET | Freq: Two times a day (BID) | ORAL | 2 refills | Status: DC

## 2016-07-29 MED ORDER — LORAZEPAM 0.5 MG PO TABS: 0.5000 mg | ORAL_TABLET | Freq: Every day | ORAL | 1 refills | Status: DC

## 2016-07-29 NOTE — Progress Notes (Signed)
Psychiatric MD Progress Note  Patient Identification: Kerri Carter MRN:  [id number] Date of Evaluation:  07/29/2016 Referral Source: PCP Chief Complaint:   Chief Complaint    Follow-up; Medication Refill     Visit Diagnosis:    ICD-10-CM   1. Mixed bipolar I disorder (HCC) F31.60     History of Present Illness:   Patient is a 76 year old white female who presented for follow up. Patient reported that she has  been taking her medications as prescribed. She Stated that she has been having back pain and is doing better at night. She reported that she has been trying to lose weight. We discussed about her medications in detail. She stated that she was given lorazepam by her primary care physician although she is supposed to get it from our office. She takes all other medications as prescribed. She appeared calm and alert at this time. She denied having any perceptual disturbances. She denied having any suicidal homicidal ideations or plans. She takes Seroquel and lorazepam at night to help her sleep and the combination has been helping her. She reported that she spends most of the time at home.        Associated Signs/Symptoms: Depression Symptoms:  insomnia, anxiety, disturbed sleep, (Hypo) Manic Symptoms:  Labiality of Mood, Anxiety Symptoms:  Excessive Worry, Psychotic Symptoms:  Hallucinations: Visual PTSD Symptoms: Negative NA  Past Psychiatric History: H/o conversion disorder.  Previous Psychotropic Medications:  Trazodone  ambien lexapro lamictal   Substance Abuse History in the last 12 months:  No.  Consequences of Substance Abuse: Negative NA  Past Medical History:  Past Medical History:  Diagnosis Date  . Anxiety   . Asthma   . Chicken pox   . CKD (chronic kidney disease), stage III    a. s/p R nephrectomy.  . Conversion disorder   . COPD (chronic obstructive pulmonary disease) ([place])   . Depression   . Essential hypertension   . GERD  (gastroesophageal reflux disease)   . Hyperlipidemia   . Inflammatory arthritis    a. hands/carpal tunnel.  b. Low titer rheumatoid factor. c. Negative anti-CCP antibodies. d. Plaquenil.  . Non-Obstructive CAD    a. 07/2009 Cath ([facility]): nonobs dzs;  b. 03/2011 Cath [facility]): nonobs dzs.  . Osteoarthritis    a. Knees.  . PUD (peptic ulcer disease)   . Toxic maculopathy   . Valvular heart disease    a. 07/2015 Echo: EF 55-60%, Mild AI, AS, MR, and TR.    Past Surgical History:  Procedure Laterality Date  . APPENDECTOMY    . BUNIONECTOMY    . CESAREAN SECTION     x1  . CHOLECYSTECTOMY N/A 05/11/2016   Procedure: LAPAROSCOPIC CHOLECYSTECTOMY;  Surgeon: Florene Glen, MD;  Location: [facility] ORS;  Service: General;  Laterality: N/A;  . COLONOSCOPY WITH PROPOFOL N/A 04/02/2016   Procedure: COLONOSCOPY WITH PROPOFOL;  Surgeon: Jonathon Bellows, MD;  Location: [facility] ENDOSCOPY;  Service: Endoscopy;  Laterality: N/A;  . ENDOSCOPIC RETROGRADE CHOLANGIOPANCREATOGRAPHY (ERCP) WITH PROPOFOL N/A 05/08/2016   Procedure: ENDOSCOPIC RETROGRADE CHOLANGIOPANCREATOGRAPHY (ERCP) WITH PROPOFOL;  Surgeon: Lucilla Lame, MD;  Location: [facility] ENDOSCOPY;  Service: Endoscopy;  Laterality: N/A;  . ESOPHAGEAL DILATION  04/02/2016   Procedure: ESOPHAGEAL DILATION;  Surgeon: Jonathon Bellows, MD;  Location: [facility] ENDOSCOPY;  Service: Endoscopy;;  . ESOPHAGOGASTRODUODENOSCOPY (EGD) WITH PROPOFOL N/A 04/02/2016   Procedure: ESOPHAGOGASTRODUODENOSCOPY (EGD) WITH PROPOFOL;  Surgeon: Jonathon Bellows, MD;  Location: [facility] ENDOSCOPY;  Service: Endoscopy;  Laterality: N/A;  . 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 TONSILLECTOMY    . TOTAL HIP ARTHROPLASTY  12/10/11   [facility] left hip  . TUBAL LIGATION      Family Psychiatric History:  denied  Family History:  Family History  Problem Relation Age of Onset  . Rheum arthritis Mother   . Asthma Mother   . Parkinson's disease Mother   . Heart disease Mother    . Stroke Mother   . Hypertension Mother   . Heart attack Father   . Heart disease Father   . Hypertension Father   . Diabetes Son   . Asthma Sister   . Heart disease Sister   . Lung cancer Sister   . Heart disease Sister   . Heart disease Sister   . Breast cancer Sister   . Heart attack Sister   . Heart disease Brother   . Heart disease Maternal Grandmother   . Diabetes Maternal Grandmother   . Colon cancer Maternal Grandmother   . Heart disease Brother   . Alcohol abuse Brother   . Depression Brother     Social History:   Social History   Social History  . Marital status: Married    Spouse name: N/A  . Number of children: 2  . Years of education: Some Coll   Occupational History  . Retired    Social History Main Topics  . Smoking status: Former Smoker    Packs/day: 0.50    Years: 20.00    Types: Cigarettes    Quit date: 02/02/1974  . Smokeless tobacco: Never Used  . Alcohol use No  . Drug use: No  . Sexual activity: Not Currently   Other Topics Concern  . None   Social History Narrative   Lives at home in [place] with her husband and grandson.   Right-handed.   6 cups coffee per day.    Additional Social History:  Married Lives with husband, grand son and his fiance.   Allergies:   Allergies  Allergen Reactions  . Ceftin [Cefuroxime Axetil] Anaphylaxis    Other reaction(s): Other (See Comments) REACTION: tongue and throat swell Other Reaction: TONGUE AND THROAT SWELLING REACTION: tongue and throat swell  . Lisinopril Anaphylaxis    REACTION: tongue and throat swelling (onset 10-10-09)  . Antihistamines, Chlorpheniramine-Type     REACTION: makes pt hyper  . Antivert [Meclizine Hcl]     Other reaction(s): Unknown REACTION: bladder will not empty REACTION: bladder will not empty  . Decongestant [Pseudoephedrine Hcl]     Other reaction(s): Unknown REACTION: makes pt hyper REACTION: makes pt hyper  . Doxycycline     REACTION: GI upset  .  Morphine And Related Other (See Comments)    Per patient, low blood pressure issues, following morphine, that requires action to raise it back up.  . Polymyxin B     Medication was in eye drops.  . Sulfa Antibiotics     Face swelling  . Xarelto [Rivaroxaban]     Other reaction(s): Other (See Comments) GI bleed Stomach burning, bleeding, and tar in stool GI bleed  . Adhesive [Tape] Rash  . Iodine Hives and Rash  . Levaquin [Levofloxacin In D5w] Rash  . Tetanus Toxoids     REACTION: rash, fever, hot to touch at injection site    Metabolic Disorder Labs: Lab Results  Component Value Date   HGBA1C 5.7 (H) 05/07/2016   MPG 117 05/07/2016   No  results found for: PROLACTIN Lab Results  Component Value Date   CHOL 124 06/08/2016   TRIG 183.0 (H) 06/08/2016   HDL 44.80 06/08/2016   CHOLHDL 3 06/08/2016   VLDL 36.6 06/08/2016   LDLCALC 43 06/08/2016     Current Medications: Current Outpatient Prescriptions  Medication Sig Dispense Refill  . albuterol (VENTOLIN HFA) 108 (90 Base) MCG/ACT inhaler Inhale 2 puffs into the lungs every 6 (six) hours as needed. 1 Inhaler 5  . BREO ELLIPTA 100-25 MCG/INH AEPB Inhale 1 puff into the lungs daily.   0  . BYSTOLIC 10 MG tablet take 1 tablet by mouth once daily 30 tablet 3  . CARAFATE 1 GM/10ML suspension Take 1 capsule by mouth daily.  0  . docusate (COLACE) 50 MG/5ML liquid Take by mouth daily.    Marland Kitchen escitalopram (LEXAPRO) 10 MG tablet Take 1 tablet (10 mg total) by mouth daily. 30 tablet 1  . folic acid (FOLVITE) 1 MG tablet Take 1 mg by mouth daily.    . furosemide (LASIX) 20 MG tablet take 1 tablet by mouth once daily 30 tablet 4  . gabapentin (NEURONTIN) 300 MG capsule take 1 capsule by mouth four times a day (Patient taking differently: take 1 capsule by mouth three times a day) 120 capsule 2  . lamoTRIgine (LAMICTAL) 100 MG tablet Take 1 tablet (100 mg total) by mouth 2 (two) times daily. 60 tablet 2  . LORazepam (ATIVAN) 0.5 MG  tablet Take 1 tablet (0.5 mg total) by mouth at bedtime. 20 tablet 0  . lovastatin (MEVACOR) 20 MG tablet Take 1 tablet (20 mg total) by mouth at bedtime. 90 tablet 3  . methotrexate 50 MG/2ML injection Inject 250 mg into the skin once a week. Inject 10 ml once weekly. Patient takes on Monday.    . montelukast (SINGULAIR) 10 MG tablet take 1 tablet by mouth once daily 90 tablet 1  . multivitamin-lutein (OCUVITE-LUTEIN) CAPS capsule Take 1 capsule by mouth 2 (two) times daily.    . pantoprazole (PROTONIX) 40 MG tablet take 1 tablet by mouth twice a day as directed 180 tablet 2  . potassium chloride (K-DUR) 10 MEQ tablet take 1 tablet by mouth once daily 30 tablet 4  . QUEtiapine (SEROQUEL) 25 MG tablet take 1 tablet by mouth at bedtime 30 tablet 1  . RA NASAL ALLERGY 55 MCG/ACT AERO nasal inhaler instill 2 sprays into each nostril once daily  0  . ranitidine (ZANTAC) 150 MG capsule take 1 capsule by mouth twice a day 60 capsule 5   No current facility-administered medications for this visit.     Neurologic: Headache: No Seizure: No Paresthesias:No  Musculoskeletal: Strength & Muscle Tone: within normal limits Gait & Station: normal Patient leans: N/A  Psychiatric Specialty Exam: ROS  Blood pressure 126/62, pulse 73, temperature 98.5 F (36.9 C), temperature source Oral, weight 167 lb 3.2 oz (75.8 kg).Body mass index is 28.7 kg/m.  General Appearance: Casual  Eye Contact:  Fair  Speech:  Clear and Coherent  Volume:  Normal  Mood:  Anxious and Depressed  Affect:  Appropriate  Thought Process:  Coherent  Orientation:  Full (Time, Place, and Person)  Thought Content:  WDL  Suicidal Thoughts:  No  Homicidal Thoughts:  No  Memory:  Immediate;   Fair Recent;   Fair Remote;   Fair  Judgement:  Fair  Insight:  Fair  Psychomotor Activity:  Normal  Concentration:  Concentration: Fair and Attention Span: Fair  Recall:  Sunrise: Fair  Akathisia:  No   Handed:  Right  AIMS (if indicated):    Assets:  Communication Skills Desire for Improvement Physical Health  ADL's:  Intact  Cognition: WNL  Sleep:  poor    Treatment Plan Summary: Medication management   Discussed with patient about her medications. I will adjust her medications as follows.  Continue lamotrigine 100 mg by mouth twice a day Continue  Lexapro 10 mg every morning Continue  lorazepam 0.5 mg -Advised patient to take half to 1 pill at night at bedtime and she demonstrated understanding. Continue  Seroquel 25 mg by mouth daily at bedtime for mood symptoms and depression. Discussed with her about the side effects in detail and she demonstrated understanding.  Follow-up in  3 months or earlier depending on her symptoms.   More than 50% of the time spent in psychoeducation, counseling and coordination of care.    This note was generated in part or whole with voice recognition software. Voice regonition is usually quite accurate but there are transcription errors that can and very often do occur. I apologize for any typographical errors that were not detected and corrected.    Rainey Pines, MD 6/27/20181:38 PM

## 2016-07-29 NOTE — Telephone Encounter (Signed)
faxed and confirmed rx for ativan .5mg  ID# [id number] order # [id number] was faxed to [facility].

## 2016-07-31 ENCOUNTER — Other Ambulatory Visit: Payer: Self-pay | Admitting: Family Medicine

## 2016-07-31 ENCOUNTER — Telehealth: Payer: Self-pay

## 2016-07-31 NOTE — Telephone Encounter (Signed)
Patient is no longer in severe pain it was only Monday night. Patient said when she was having a bowel movement she felt herself getting dizzy then came to, being on the bathroom floor.  The pain and all other SX stopped later that night and have not returned. Instructed that if patient has further SX she will need to go to ED she stated she will comply.

## 2016-07-31 NOTE — Telephone Encounter (Signed)
If pain is severe, she needs to go to ED.

## 2016-07-31 NOTE — Telephone Encounter (Signed)
Patient fell Monday night  and passed out while on commode and she didn't hit head,complaining of  left arm pain.   She didn't go to hospital.   She is complaining of  severe abdominal pain.   Patient is trying to to get in to see GI MD.  She is scheduled to see them on  July 26 th.   She vomited on Monday  Other symptoms nausea, no fever.

## 2016-08-04 ENCOUNTER — Ambulatory Visit
Admission: RE | Admit: 2016-08-04 | Discharge: 2016-08-04 | Disposition: A | Discharge status: Home / Self Care | Payer: Medicare Other | Source: Ambulatory Visit | Attending: Orthopedic Surgery | Admitting: Orthopedic Surgery

## 2016-08-04 DIAGNOSIS — M48061 Spinal stenosis, lumbar region without neurogenic claudication: Secondary | ICD-10-CM | POA: Diagnosis not present

## 2016-08-04 DIAGNOSIS — M4696 Unspecified inflammatory spondylopathy, lumbar region: Secondary | ICD-10-CM | POA: Diagnosis not present

## 2016-08-04 DIAGNOSIS — M5126 Other intervertebral disc displacement, lumbar region: Secondary | ICD-10-CM | POA: Insufficient documentation

## 2016-08-04 DIAGNOSIS — M9963 Osseous and subluxation stenosis of intervertebral foramina of lumbar region: Secondary | ICD-10-CM | POA: Diagnosis present

## 2016-08-04 DIAGNOSIS — M2578 Osteophyte, vertebrae: Secondary | ICD-10-CM | POA: Insufficient documentation

## 2016-08-04 DIAGNOSIS — M5137 Other intervertebral disc degeneration, lumbosacral region: Secondary | ICD-10-CM | POA: Insufficient documentation

## 2016-08-06 ENCOUNTER — Telehealth: Payer: Self-pay | Admitting: *Deleted

## 2016-08-06 NOTE — Telephone Encounter (Signed)
Kerri Carter from [facility] has requested verbal orders to continue skilled nursing visits . Patient continues to have abdominal and back pain . Patient had a fall from her bed last Thursday. She had no injuries from fall.  Please call Kerri Carter [phone]

## 2016-08-06 NOTE — Telephone Encounter (Signed)
Ok for verbal 

## 2016-08-06 NOTE — Telephone Encounter (Signed)
Orders given as below.

## 2016-08-25 DIAGNOSIS — Z87898 Personal history of other specified conditions: Secondary | ICD-10-CM

## 2016-08-25 DIAGNOSIS — R7303 Prediabetes: Secondary | ICD-10-CM | POA: Insufficient documentation

## 2016-08-25 DIAGNOSIS — I2510 Atherosclerotic heart disease of native coronary artery without angina pectoris: Secondary | ICD-10-CM | POA: Insufficient documentation

## 2016-08-25 DIAGNOSIS — N9489 Other specified conditions associated with female genital organs and menstrual cycle: Secondary | ICD-10-CM | POA: Insufficient documentation

## 2016-08-25 DIAGNOSIS — E876 Hypokalemia: Secondary | ICD-10-CM

## 2016-08-25 DIAGNOSIS — N1832 Chronic kidney disease, stage 3b: Secondary | ICD-10-CM | POA: Diagnosis present

## 2016-08-25 HISTORY — DX: Hypokalemia: E87.6

## 2016-08-25 HISTORY — DX: Personal history of other specified conditions: Z87.898

## 2016-08-27 DIAGNOSIS — M810 Age-related osteoporosis without current pathological fracture: Secondary | ICD-10-CM | POA: Insufficient documentation

## 2016-08-27 DIAGNOSIS — T50905A Adverse effect of unspecified drugs, medicaments and biological substances, initial encounter: Secondary | ICD-10-CM | POA: Insufficient documentation

## 2016-08-27 DIAGNOSIS — M818 Other osteoporosis without current pathological fracture: Secondary | ICD-10-CM | POA: Insufficient documentation

## 2016-08-31 ENCOUNTER — Ambulatory Visit: Payer: Medicare Other | Admitting: Gastroenterology

## 2016-09-03 ENCOUNTER — Other Ambulatory Visit: Payer: Self-pay | Admitting: Family Medicine

## 2016-09-08 ENCOUNTER — Ambulatory Visit: Payer: Medicare Other | Admitting: Family Medicine

## 2016-09-10 DIAGNOSIS — Z87891 Personal history of nicotine dependence: Secondary | ICD-10-CM | POA: Insufficient documentation

## 2016-09-15 DIAGNOSIS — M48061 Spinal stenosis, lumbar region without neurogenic claudication: Secondary | ICD-10-CM | POA: Insufficient documentation

## 2016-09-15 HISTORY — DX: Spinal stenosis, lumbar region without neurogenic claudication: M48.061

## 2016-09-24 IMAGING — CT CT ABD-PELV W/O CM
2 of 4 series · 16 of 46 positions shown, 18 images · non-contrast
Comparison: 03/01/2011

CLINICAL DATA: Pt arrives via EMS from home, complains of chest
pain and generalized abdomen pain that began at 2766, states pain is
continuous in nature, EMS reports giving 324 ASA and 1 episode of
vomiting, pt arrives with eyes closed, not speaking very much. Some
nausea.

EXAM:
CT ABDOMEN AND PELVIS WITHOUT CONTRAST
TECHNIQUE: Multidetector CT imaging of the abdomen and pelvis was performed
following the standard protocol without IV contrast.

[Series 2: axial st · axial · 0.78mm/px · z∈[-738,-348]mm · 13 of 86 slices shown, 15 images]
[im 4/86  soft-tissue]
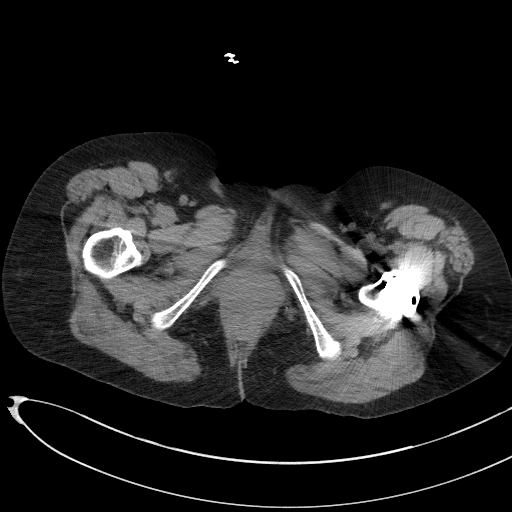
[im 4/86  bone]
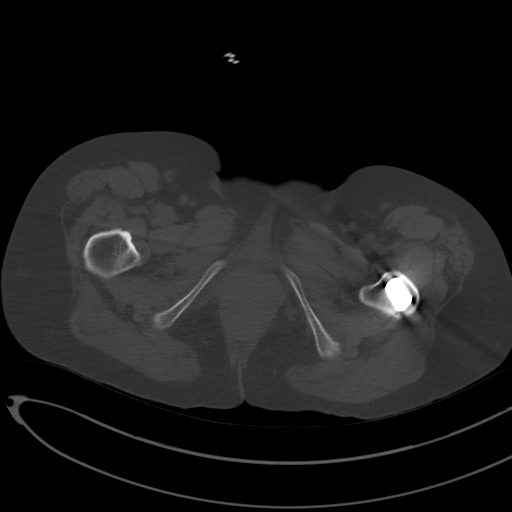
[im 11/86  soft-tissue]
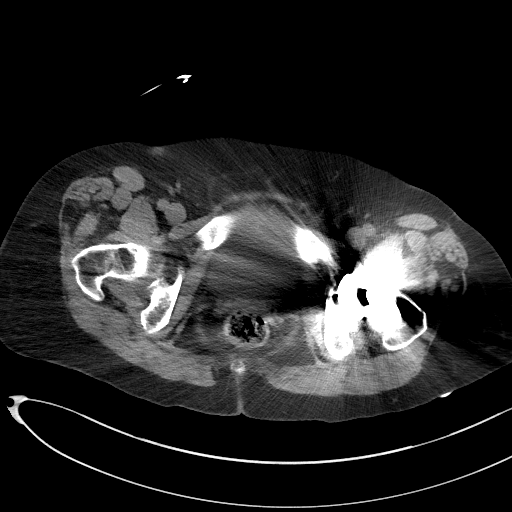
[im 18/86  soft-tissue]
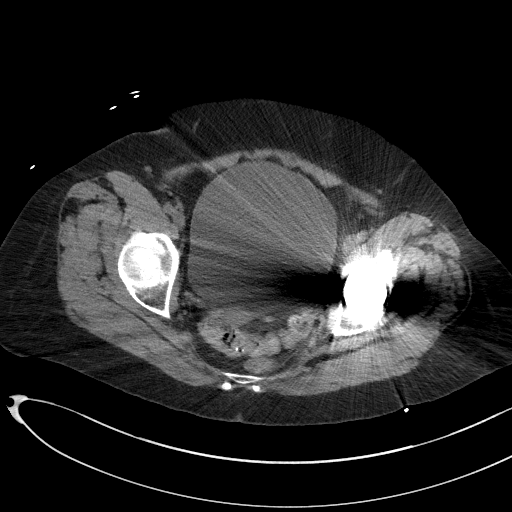
[im 24/86  soft-tissue]
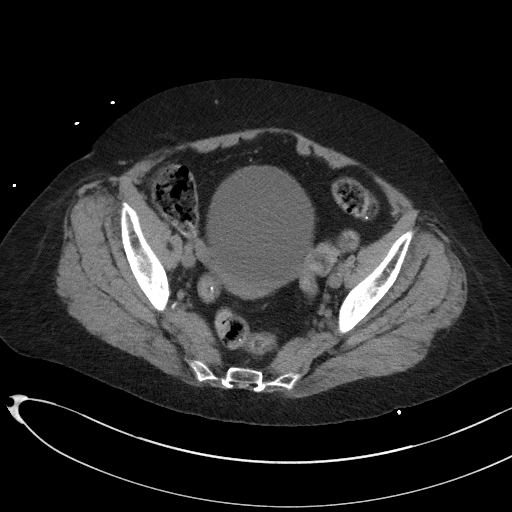
[im 31/86  soft-tissue]
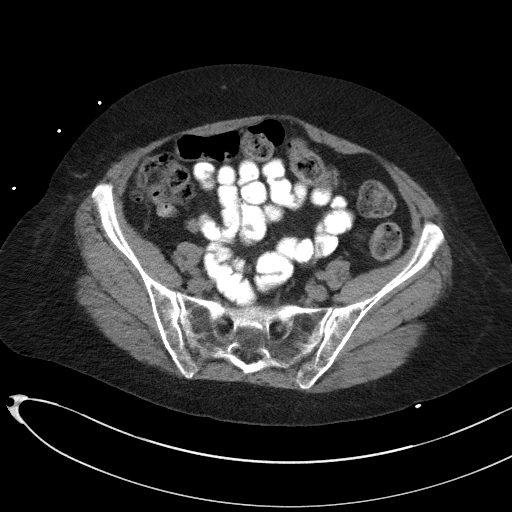
[im 38/86  soft-tissue]
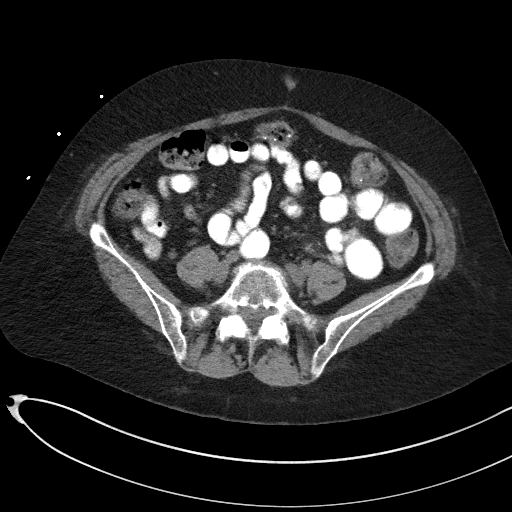
[im 45/86  soft-tissue]
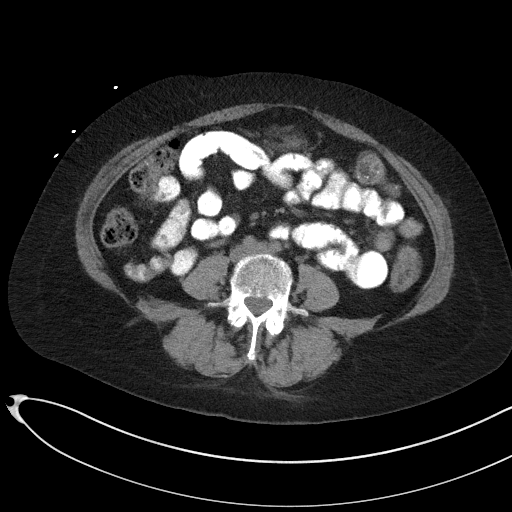
[im 48/86  soft-tissue]
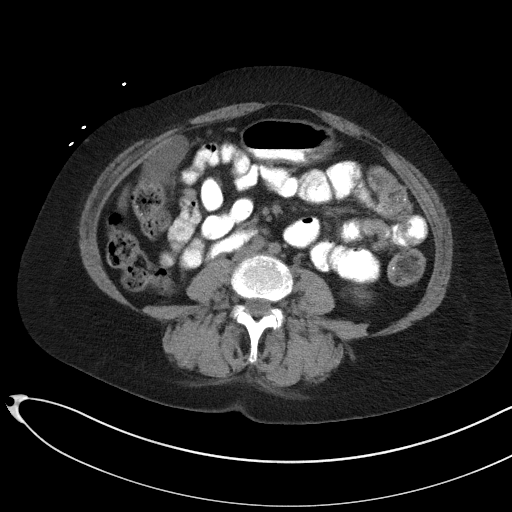
[im 55/86  soft-tissue]
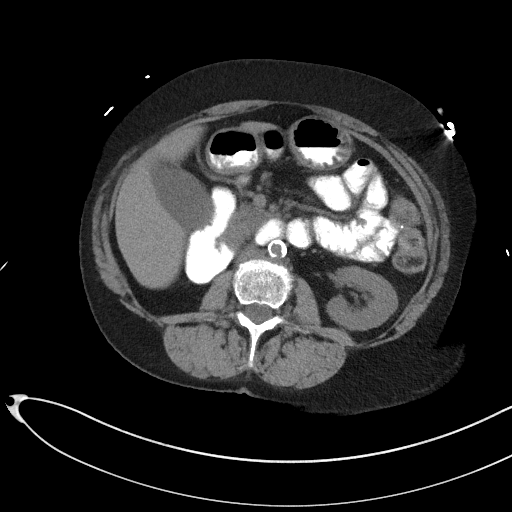
[im 55/86  bone]
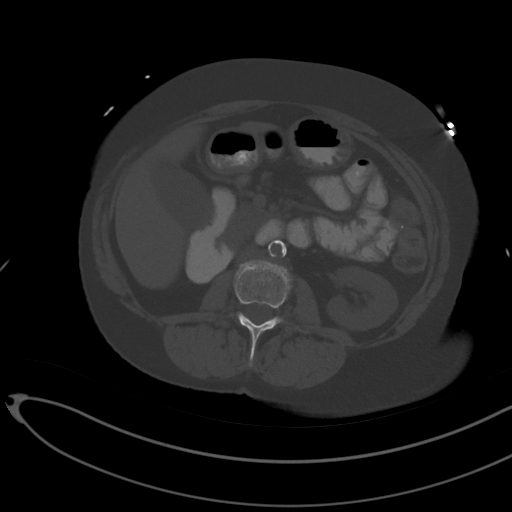
[im 62/86  soft-tissue]
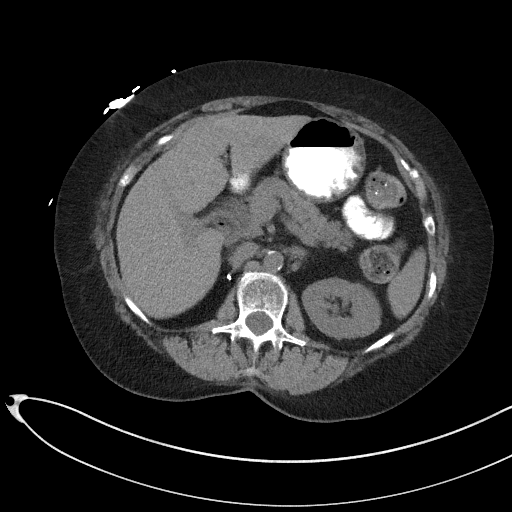
[im 69/86  soft-tissue]
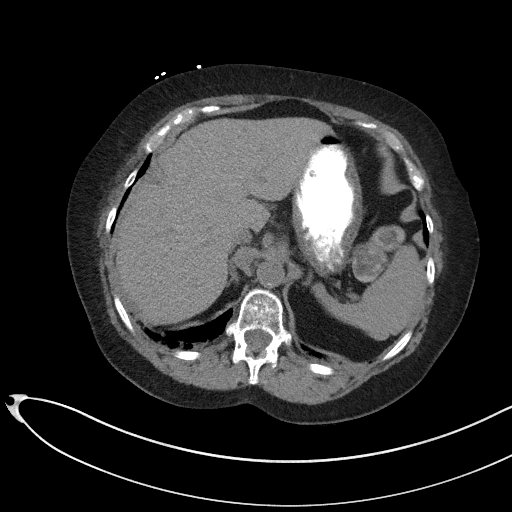
[im 75/86  soft-tissue]
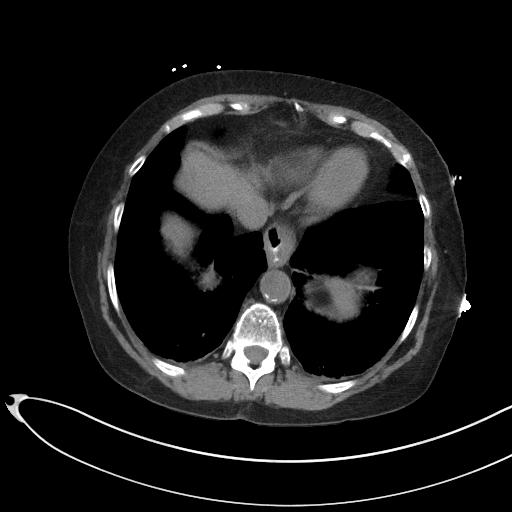
[im 82/86  soft-tissue]
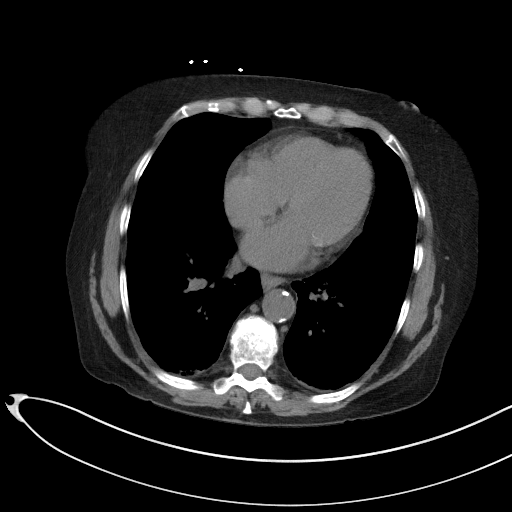

[Series 5: coronal st · coronal · 0.67mm/px · 3 of 87 slices shown]
[im 29/87  soft-tissue]
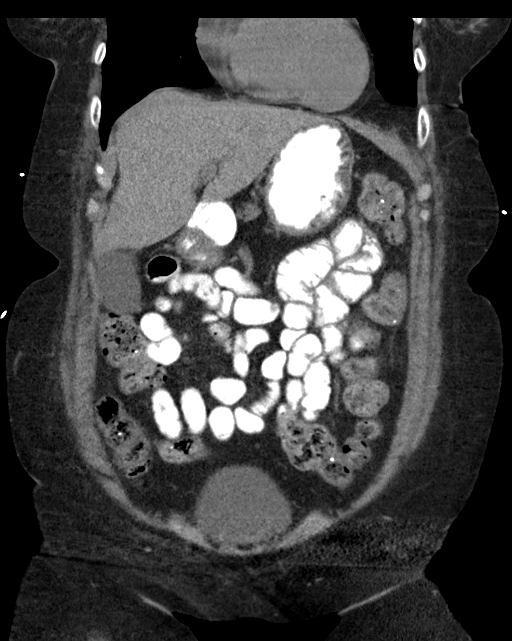
[im 39/87  soft-tissue]
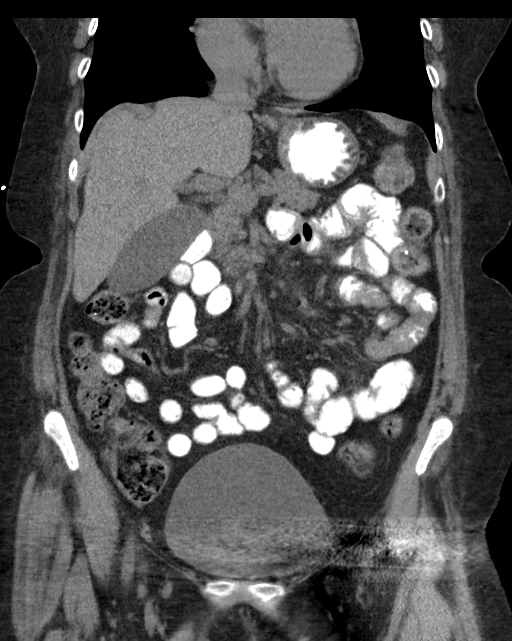
[im 48/87  soft-tissue]
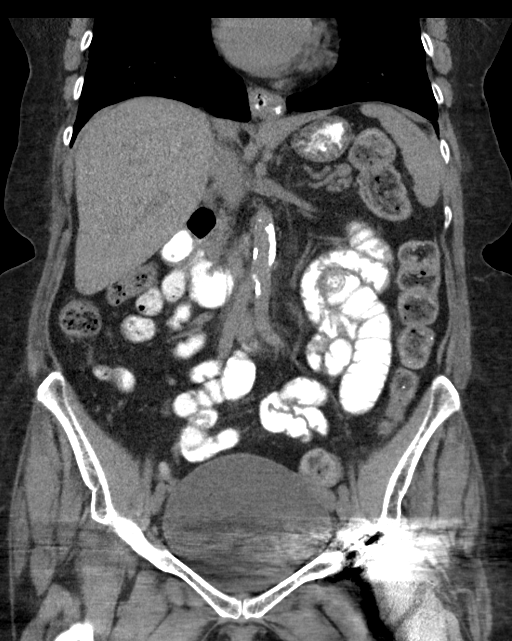

[16 of 46 positions shown; findings below may reference images not displayed]

FINDINGS: Lung bases: Minor dependent subsegmental atelectasis. No evidence of
pneumonia or edema. Heart normal size.

Hepatobiliary: Normal liver. Gallbladder is distended. There a small
dependent gallstone. No wall thickening or adjacent inflammation. No
bile duct dilation.

Spleen, pancreas, adrenal glands:  Normal.

Kidneys, ureters, bladder: Right kidney is surgically absent. No
left renal masses or stones. No hydronephrosis. Small peripherally
calcified renal artery aneurysm is stable. Normal left ureter.
Bladder is unremarkable.

Uterus and adnexa:  Unremarkable.

Lymph nodes:  No adenopathy.

Ascites: None.

Vascular: Atherosclerotic calcifications noted along a normal
caliber abdominal aorta.

Gastrointestinal: No bowel dilation. No wall thickening or
inflammatory changes. There are few colonic diverticula. Small
hiatal hernia.

Musculoskeletal: Left hip total arthroplasty appears well seated and
aligned. There are chronic changes of avascular necrosis of the
right femoral head. Degenerative changes are noted of the lumbar
spine. No osteoblastic or osteolytic lesions.
IMPRESSION: 1. No acute findings within the abdomen or pelvis.
2. Cholelithiasis.  No CT evidence of acute cholecystitis.
3. Status post right nephrectomy.
4. There are several colonic diverticula but no evidence of
diverticulitis.
5. Aortic atherosclerosis.

## 2016-09-24 IMAGING — CR DG CHEST 2V
1 series · 2 of 2 positions shown · non-contrast
Comparison: CT of the chest 12/04/2014

CLINICAL DATA: Chest pain.

EXAM:
CHEST  2 VIEW

[Series 1: x chest ap · 0.14mm/px · 2 of 2 slices shown]
[im 1/2]
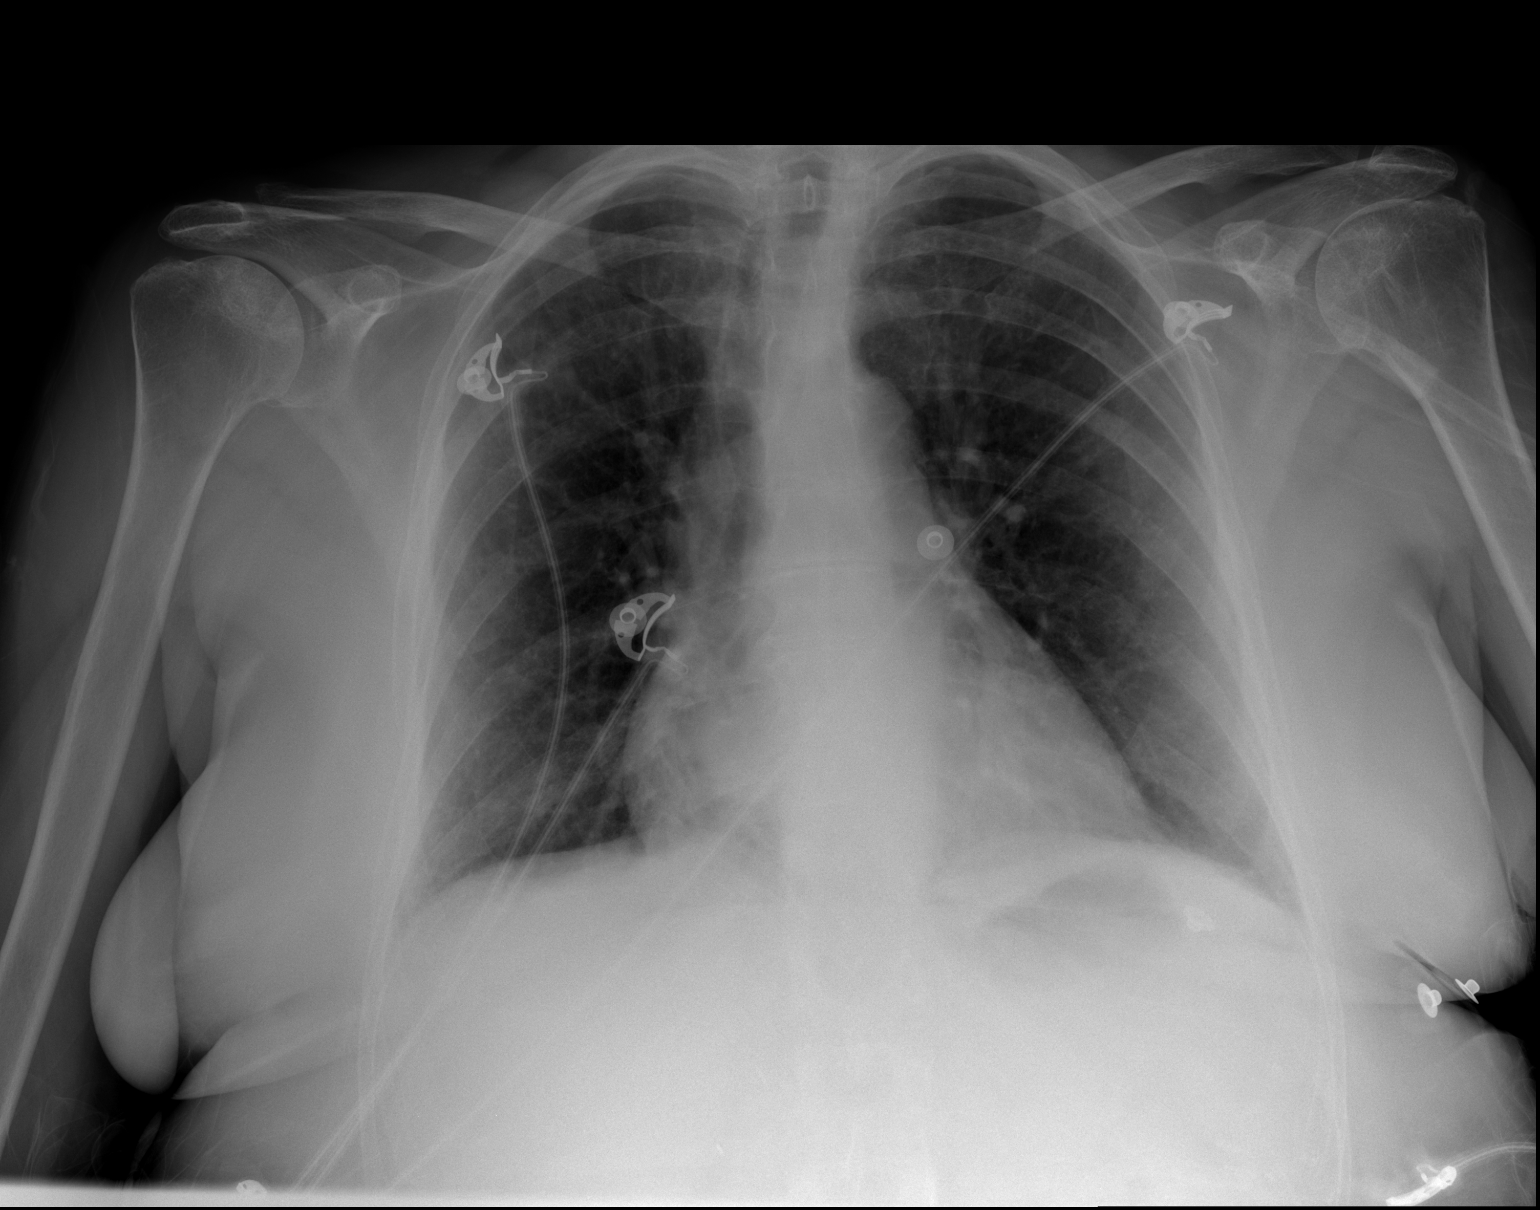
[im 2/2]
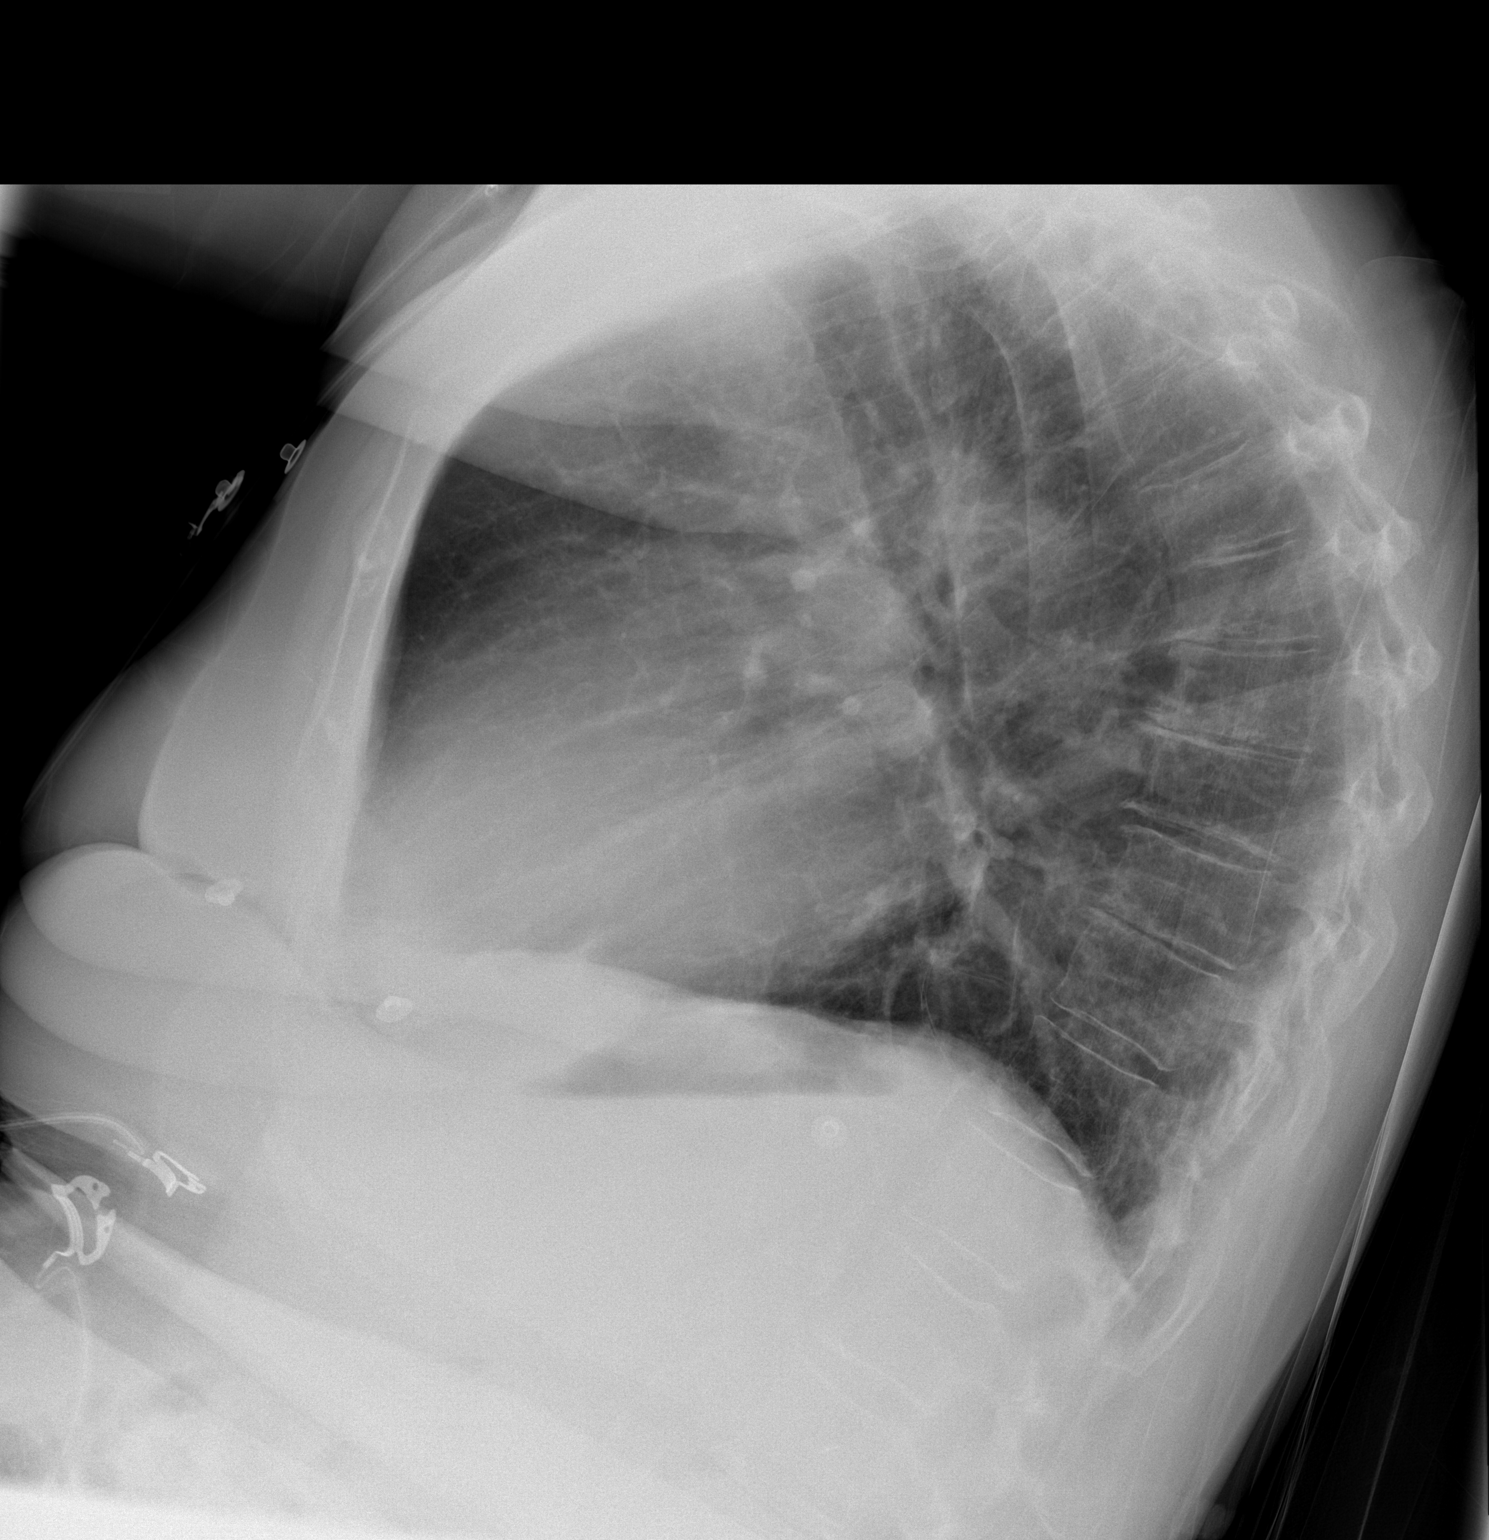

[2 of 2 positions shown; findings below may reference images not displayed]

FINDINGS: The heart size and mediastinal contours are within normal limits.
Both lungs are clear. The visualized skeletal structures are without
acute abnormality. Exaggerated thoracic kyphosis due to
osteoarthritic changes of the mid thoracic spine noted.
IMPRESSION: No active cardiopulmonary disease.

## 2016-10-01 ENCOUNTER — Other Ambulatory Visit: Payer: Self-pay | Admitting: Family Medicine

## 2016-10-07 ENCOUNTER — Ambulatory Visit
Admission: RE | Admit: 2016-10-07 | Discharge: 2016-10-07 | Disposition: A | Discharge status: Home / Self Care | Payer: Medicare Other | Source: Ambulatory Visit | Attending: Urology | Admitting: Urology

## 2016-10-07 DIAGNOSIS — Z905 Acquired absence of kidney: Secondary | ICD-10-CM | POA: Insufficient documentation

## 2016-10-07 DIAGNOSIS — N2889 Other specified disorders of kidney and ureter: Secondary | ICD-10-CM | POA: Diagnosis not present

## 2016-10-13 ENCOUNTER — Encounter: Payer: Self-pay | Admitting: Urology

## 2016-10-13 ENCOUNTER — Other Ambulatory Visit: Payer: Self-pay

## 2016-10-13 ENCOUNTER — Ambulatory Visit (INDEPENDENT_AMBULATORY_CARE_PROVIDER_SITE_OTHER): Payer: Medicare Other | Admitting: Gastroenterology

## 2016-10-13 ENCOUNTER — Other Ambulatory Visit
Admission: RE | Admit: 2016-10-13 | Discharge: 2016-10-13 | Disposition: A | Discharge status: Home / Self Care | Payer: Medicare Other | Source: Ambulatory Visit | Attending: Gastroenterology | Admitting: Gastroenterology

## 2016-10-13 ENCOUNTER — Encounter: Payer: Self-pay | Admitting: Gastroenterology

## 2016-10-13 ENCOUNTER — Ambulatory Visit (INDEPENDENT_AMBULATORY_CARE_PROVIDER_SITE_OTHER): Payer: Medicare Other | Admitting: Urology

## 2016-10-13 VITALS — BP 124/66 | HR 70 | Ht 64.0 in | Wt 161.0 lb

## 2016-10-13 VITALS — BP 112/54 | HR 76 | Temp 98.3°F | Ht 64.0 in | Wt 160.6 lb

## 2016-10-13 DIAGNOSIS — R350 Frequency of micturition: Secondary | ICD-10-CM

## 2016-10-13 DIAGNOSIS — R1031 Right lower quadrant pain: Secondary | ICD-10-CM

## 2016-10-13 DIAGNOSIS — Z87898 Personal history of other specified conditions: Secondary | ICD-10-CM | POA: Diagnosis not present

## 2016-10-13 DIAGNOSIS — N2889 Other specified disorders of kidney and ureter: Secondary | ICD-10-CM | POA: Diagnosis not present

## 2016-10-13 DIAGNOSIS — N3941 Urge incontinence: Secondary | ICD-10-CM

## 2016-10-13 DIAGNOSIS — G8929 Other chronic pain: Secondary | ICD-10-CM

## 2016-10-13 LAB — CBC WITH DIFFERENTIAL/PLATELET
Basophils Absolute: 0.1 10*3/uL (ref 0–0.1)
Basophils Relative: 1 %
Eosinophils Absolute: 0.1 10*3/uL (ref 0–0.7)
Eosinophils Relative: 1 %
HCT: 38.3 % (ref 35.0–47.0)
Hemoglobin: 12.8 g/dL (ref 12.0–16.0)
Lymphocytes Relative: 15 %
Lymphs Abs: 1.5 10*3/uL (ref 1.0–3.6)
MCH: 27.6 pg (ref 26.0–34.0)
MCHC: 33.3 g/dL (ref 32.0–36.0)
MCV: 82.9 fL (ref 80.0–100.0)
Monocytes Absolute: 0.2 10*3/uL (ref 0.2–0.9)
Monocytes Relative: 2 %
Neutro Abs: 8.2 10*3/uL — ABNORMAL HIGH (ref 1.4–6.5)
Neutrophils Relative %: 81 %
Platelets: 228 10*3/uL (ref 150–440)
RBC: 4.62 MIL/uL (ref 3.80–5.20)
RDW: 16.5 % — ABNORMAL HIGH (ref 11.5–14.5)
WBC: 10.1 10*3/uL (ref 3.6–11.0)

## 2016-10-13 MED ORDER — SUCRALFATE 1 G PO TABS
1.0000 g | ORAL_TABLET | Freq: Three times a day (TID) | ORAL | 0 refills | Status: DC
Start: 2016-10-13 — End: 2016-11-21

## 2016-10-13 NOTE — Progress Notes (Signed)
[name], MRCP(U.K) [address]  [place]  [place], [place] [address]  Main: [phone]  Fax: [phone]   Primary Care Physician: Coral Spikes, DO  Primary Gastroenterologist:  Dr. Jonathon Bellows   Chief Complaint  Patient presents with  . Follow-up    HPI: Kerri Carter is a 76 y.o. female   Summary of history : She has previously seen Dr Tiffany Kocher at [facility] ,h/o dysphagia, diarrhea, iron deficiency anemia due to small bowel AVM's . Sheis here today to follow up for abdominal pain and anemia.   She has visited the ER 02/25/16 for abdominal pain. CT abdomen pelvis was negative but showed an adnexal lesion which was recommended to undergo a pelvic USG. LFT's in 02/2014 showed an elevated alkaline phosphatase , normal lipase and Hb 11 grams . Iron studies normal .EGD/colonoscopy 04/2016 ,EGD - mild chronic duodenitis on bx , normal villi. Gastric biopsies showed erosive gastritis , normal esophageal biopsies. Random colon biopsies showed mild active colitis., 3 tubular adenomas were excised. Celiac serology was negative , TSH normal    Interval history 04/2016 -10/13/2016   Since last visit she 2 episodes of abdominal pain , vomiting and diarrhea. Resolved on its own , lasted 30 mins.   She is on Protonix, Zantac . No blood in her stool.No bloating .  Presently no diarrhea.    Current Outpatient Prescriptions  Medication Sig Dispense Refill  . albuterol (VENTOLIN HFA) 108 (90 Base) MCG/ACT inhaler Inhale 2 puffs into the lungs every 6 (six) hours as needed. 1 Inhaler 5  . BREO ELLIPTA 100-25 MCG/INH AEPB Inhale 1 puff into the lungs daily.   0  . BYSTOLIC 10 MG tablet take 1 tablet by mouth once daily 30 tablet 3  . Calcium-Vitamin D-Vitamin K (CALCIUM SOFT CHEWS) [phone] MG-UNT-MCG CHEW Chew by mouth.    Marland Kitchen CARAFATE 1 GM/10ML suspension Take 1 capsule by mouth daily.  0  . docusate (COLACE) 50 MG/5ML liquid Take by mouth daily.    Marland Kitchen escitalopram (LEXAPRO) 10  MG tablet Take 1 tablet (10 mg total) by mouth daily. 30 tablet 2  . folic acid (FOLVITE) 1 MG tablet Take 1 mg by mouth daily.    . furosemide (LASIX) 20 MG tablet take 1 tablet by mouth once daily 30 tablet 4  . gabapentin (NEURONTIN) 300 MG capsule TAKE ONE CAPSULE BY MOUTH FOUR TIMES DAILY 120 capsule 0  . lamoTRIgine (LAMICTAL) 100 MG tablet Take 1 tablet (100 mg total) by mouth 2 (two) times daily. 60 tablet 2  . leflunomide (ARAVA) 10 MG tablet   0  . LORazepam (ATIVAN) 0.5 MG tablet Take 1 tablet (0.5 mg total) by mouth at bedtime. 30 tablet 1  . lovastatin (MEVACOR) 20 MG tablet Take 1 tablet (20 mg total) by mouth at bedtime. 90 tablet 3  . methocarbamol (ROBAXIN) 500 MG tablet Take by mouth.    . montelukast (SINGULAIR) 10 MG tablet take 1 tablet by mouth once daily 90 tablet 1  . multivitamin-lutein (OCUVITE-LUTEIN) CAPS capsule Take 1 capsule by mouth 2 (two) times daily.    Marland Kitchen oxyCODONE (OXY IR/ROXICODONE) 5 MG immediate release tablet Take by mouth.    . pantoprazole (PROTONIX) 40 MG tablet take 1 tablet by mouth twice a day as directed 180 tablet 2  . potassium chloride (K-DUR) 10 MEQ tablet take 1 tablet by mouth once daily 30 tablet 4  . QUEtiapine (SEROQUEL) 25 MG tablet Take 1 tablet (  25 mg total) by mouth at bedtime. 30 tablet 2  . RA NASAL ALLERGY 55 MCG/ACT AERO nasal inhaler instill 2 sprays into each nostril once daily  0  . ranitidine (ZANTAC) 150 MG capsule take 1 capsule by mouth twice a day 60 capsule 2  . traMADol (ULTRAM) 50 MG tablet take 1 tablet by mouth every 6 hours if needed for pain  0  . methotrexate 50 MG/2ML injection Inject 250 mg into the skin once a week. Inject 10 ml once weekly. Patient takes on Monday.     No current facility-administered medications for this visit.     Allergies as of 10/13/2016 - Review Complete 10/13/2016  Allergen Reaction Noted  . Ceftin [cefuroxime axetil] Anaphylaxis 09/08/2011  . Lisinopril Anaphylaxis 09/08/2011  .  Morphine Other (See Comments) 02/25/2012  . Aspirin Other (See Comments) 09/15/2016  . Antihistamines, chlorpheniramine-type  09/08/2011  . Antivert [meclizine hcl]  09/08/2011  . Decongestant [pseudoephedrine hcl]  09/08/2011  . Doxycycline  09/08/2011  . Morphine and related Other (See Comments) 06/21/2015  . Polymyxin b  02/28/2016  . Pseudoephedrine hcl Other (See Comments) 09/08/2011  . Sulfa antibiotics  04/25/2014  . Xarelto [rivaroxaban]  01/19/2012  . Adhesive [tape] Rash 09/08/2011  . Iodine Hives and Rash 09/08/2011  . Levaquin [levofloxacin in d5w] Rash 09/08/2011  . Tetanus toxoid adsorbed Other (See Comments) 09/08/2011  . Tetanus toxoids  09/08/2011    ROS:  General: Negative for anorexia, weight loss, fever, chills, fatigue, weakness. ENT: Negative for hoarseness, difficulty swallowing , nasal congestion. CV: Negative for chest pain, angina, palpitations, dyspnea on exertion, peripheral edema.  Respiratory: Negative for dyspnea at rest, dyspnea on exertion, cough, sputum, wheezing.  GI: See history of present illness. GU:  Negative for dysuria, hematuria, urinary incontinence, urinary frequency, nocturnal urination.  Endo: Negative for unusual weight change.    Physical Examination:   BP (!) 112/54 (BP Location: Left Arm, Patient Position: Sitting, Cuff Size: Normal)   Pulse 76   Temp 98.3 F (36.8 C) (Oral)   Ht 5\' 4"  (1.626 m)   Wt 160 lb 9.6 oz (72.8 kg)   BMI 27.57 kg/m   General: Well-nourished, well-developed in no acute distress.  Eyes: No icterus. Conjunctivae pink. Mouth: Oropharyngeal mucosa moist and pink , no lesions erythema or exudate. Lungs: Clear to auscultation bilaterally. Non-labored. Heart: Regular rate and rhythm, no murmurs rubs or gallops.  Abdomen: Bowel sounds are normal, nontender, nondistended, no hepatosplenomegaly or masses, no abdominal bruits or hernia , no rebound or guarding.   Extremities: No lower extremity edema. No  clubbing or deformities. Neuro: Alert and oriented x 3.  Grossly intact. Skin: Warm and dry, no jaundice.   Psych: Alert and cooperative, normal mood and affect.   Imaging Studies: US Renal  Result Date: 10/07/2016 CLINICAL DATA:  Right nephrectomy.  Left renal mass. EXAM: RENAL / URINARY TRACT ULTRASOUND COMPLETE COMPARISON:  Abdominal CT 06/12/2016 FINDINGS: Right Kidney: Surgically absent Left Kidney: Length: 10 cm. Echogenicity within normal limits. No mass or hydronephrosis visualized. Small cystic densities seen on prior CT are not visible on this study. Bladder: Appears normal for degree of bladder distention. IMPRESSION: 1. Negative left renal sonography. Small left renal cystic densities on May 2018 CT are not visible today. 2. Right nephrectomy. Electronically Signed   By: Monte Fantasia M.D.   On: 10/07/2016 15:01    Assessment and Plan:   Kerri Carter is a 76 y.o. y/o female here to follow  up for lower abdominal pain, gas and bloating which she has had on and off in the past . Presently has been eating her cereal with artificial sugar which can cause diarrhea and bloating     1. Gas/bloating/abdominal pain- avoid artificial sugars.   2.Melena- EGD/Colonoscopy were normal, H/o small bowel AVMS's, presently not anemic, recheck Hb if drops then will go ahead with capsule study   Dr Jonathon Bellows  MD,MRCP [facility]) Follow up in 6 months.

## 2016-10-13 NOTE — Progress Notes (Signed)
10/13/2016 11:12 AM   [name] 1941-01-28 [id number]  Referring provider: Coral Spikes, DO [address] [place], [place] [address]  Chief Complaint  Patient presents with  . Follow-up    HPI: 76 year old female followed by Dr. Matilde Sprang for history of urinary retention and urinary frequency, And urge incontinence who returned today for follow-up renal ultrasound and discuss her urinary symptoms.    In terms of urinary symptoms, she wears diapers daily for urinary urgency and urge incontinence. She had no further episodes of urinary retention and seems to be emptying her bladder well.  Incidental finding on CT scan abd/ pelvis with contrast performed on 03/19/16 performed for the purpose of evaluation of abdominal pain.  Per the report, there are multiple too small to characterize hypodense lesions within the left kidney which are subcentimeter as well as a subcentimeter lesion in the left kidney (lower pole) which supposedly is heterogeneous and suspicious for neoplasm.  Her right kidney is surgically absent.   Follow-up renal ultrasound today is fairly unremarkable.  She does have baseline CKD, most recent creatinine 1.28 on 06/2016.  She had a right nephrectomy 28 years ago following complications for renal artery "balloon" ? Stent which caused severe right renal atrophy.   She denies any flank pain or gross hematuria.   PMH: Past Medical History:  Diagnosis Date  . Anxiety   . Asthma   . Chicken pox   . CKD (chronic kidney disease), stage III    a. s/p R nephrectomy.  . Conversion disorder   . COPD (chronic obstructive pulmonary disease) ([place])   . Depression   . Essential hypertension   . GERD (gastroesophageal reflux disease)   . Hyperlipidemia   . Inflammatory arthritis    a. hands/carpal tunnel.  b. Low titer rheumatoid factor. c. Negative anti-CCP antibodies. d. Plaquenil.  . Non-Obstructive CAD    a. 07/2009 Cath ([facility]): nonobs dzs;  b. 03/2011  Cath [facility]): nonobs dzs.  . Osteoarthritis    a. Knees.  . PUD (peptic ulcer disease)   . Toxic maculopathy   . Valvular heart disease    a. 07/2015 Echo: EF 55-60%, Mild AI, AS, MR, and TR.    Surgical History: Past Surgical History:  Procedure Laterality Date  . APPENDECTOMY    . BUNIONECTOMY    . CESAREAN SECTION     x1  . CHOLECYSTECTOMY N/A 05/11/2016   Procedure: LAPAROSCOPIC CHOLECYSTECTOMY;  Surgeon: Florene Glen, MD;  Location: [facility] ORS;  Service: General;  Laterality: N/A;  . COLONOSCOPY WITH PROPOFOL N/A 04/02/2016   Procedure: COLONOSCOPY WITH PROPOFOL;  Surgeon: Jonathon Bellows, MD;  Location: [facility] ENDOSCOPY;  Service: Endoscopy;  Laterality: N/A;  . ENDOSCOPIC RETROGRADE CHOLANGIOPANCREATOGRAPHY (ERCP) WITH PROPOFOL N/A 05/08/2016   Procedure: ENDOSCOPIC RETROGRADE CHOLANGIOPANCREATOGRAPHY (ERCP) WITH PROPOFOL;  Surgeon: Lucilla Lame, MD;  Location: [facility] ENDOSCOPY;  Service: Endoscopy;  Laterality: N/A;  . ESOPHAGEAL DILATION  04/02/2016   Procedure: ESOPHAGEAL DILATION;  Surgeon: Jonathon Bellows, MD;  Location: [facility] ENDOSCOPY;  Service: Endoscopy;;  . ESOPHAGOGASTRODUODENOSCOPY (EGD) WITH PROPOFOL N/A 04/02/2016   Procedure: ESOPHAGOGASTRODUODENOSCOPY (EGD) WITH PROPOFOL;  Surgeon: Jonathon Bellows, MD;  Location: [facility] ENDOSCOPY;  Service: Endoscopy;  Laterality: N/A;  . 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 TONSILLECTOMY    . TOTAL HIP ARTHROPLASTY  12/10/11   [facility] left hip  . TUBAL LIGATION      Home Medications:  Allergies as of  10/13/2016      Reactions   Ceftin [cefuroxime Axetil] Anaphylaxis   Other reaction(s): Other (See Comments) REACTION: tongue and throat swell Other Reaction: TONGUE AND THROAT SWELLING REACTION: tongue and throat swell   Lisinopril Anaphylaxis   REACTION: tongue and throat swelling (onset 10-10-09)   Morphine Other (See Comments)   Per patient, low blood pressure issues, following morphine, that requires  action to raise it back up. Drop in BP - can take small infrequent doses   Aspirin Other (See Comments)   Sulfasalazine allergy cross reacts   Antihistamines, Chlorpheniramine-type    REACTION: makes pt hyper   Antivert [meclizine Hcl]    Other reaction(s): Unknown REACTION: bladder will not empty REACTION: bladder will not empty   Decongestant [pseudoephedrine Hcl]    Other reaction(s): Unknown REACTION: makes pt hyper REACTION: makes pt hyper   Doxycycline    REACTION: GI upset   Morphine And Related Other (See Comments)   Per patient, low blood pressure issues, following morphine, that requires action to raise it back up.   Polymyxin B    Medication was in eye drops.   Pseudoephedrine Hcl Other (See Comments)   REACTION: makes pt hyper   Sulfa Antibiotics    Face swelling   Xarelto [rivaroxaban]    Other reaction(s): Other (See Comments) GI bleed Stomach burning, bleeding, and tar in stool GI bleed   Adhesive [tape] Rash   Iodine Hives, Rash   Levaquin [levofloxacin In D5w] Rash   Tetanus Toxoid Adsorbed Other (See Comments)   REACTION: rash, fever, hot to touch at injection site   Tetanus Toxoids    REACTION: rash, fever, hot to touch at injection site      Medication List       Accurate as of 10/13/16 11:59 PM. Always use your most recent med list.          albuterol 108 (90 Base) MCG/ACT inhaler Commonly known as:  VENTOLIN HFA Inhale 2 puffs into the lungs every 6 (six) hours as needed.   BREO ELLIPTA 100-25 MCG/INH Aepb Generic drug:  fluticasone furoate-vilanterol Inhale 1 puff into the lungs daily.   BYSTOLIC 10 MG tablet Generic drug:  nebivolol take 1 tablet by mouth once daily   CALCIUM SOFT CHEWS [phone] MG-UNT-MCG Chew Generic drug:  Calcium-Vitamin D-Vitamin K Chew by mouth.   docusate 50 MG/5ML liquid Commonly known as:  COLACE Take 100 mg by mouth daily as needed.   escitalopram 10 MG tablet Commonly known as:  LEXAPRO Take 1  tablet (10 mg total) by mouth daily.   folic acid 1 MG tablet Commonly known as:  FOLVITE Take 1 mg by mouth daily.   FORTEO 600 MCG/2.4ML Soln Generic drug:  Teriparatide (Recombinant) Inject 0.8 mLs into the skin daily.   furosemide 20 MG tablet Commonly known as:  LASIX take 1 tablet by mouth once daily   gabapentin 300 MG capsule Commonly known as:  NEURONTIN TAKE ONE CAPSULE BY MOUTH FOUR TIMES DAILY   lamoTRIgine 100 MG tablet Commonly known as:  LAMICTAL Take 1 tablet (100 mg total) by mouth 2 (two) times daily.   leflunomide 10 MG tablet Commonly known as:  ARAVA   LORazepam 0.5 MG tablet Commonly known as:  ATIVAN Take 1 tablet (0.5 mg total) by mouth at bedtime.   lovastatin 20 MG tablet Commonly known as:  MEVACOR Take 1 tablet (20 mg total) by mouth at bedtime.   methocarbamol 500 MG tablet Commonly known as:  ROBAXIN Take by mouth.   montelukast 10 MG tablet Commonly known as:  SINGULAIR take 1 tablet by mouth once daily   multivitamin-lutein Caps capsule Take 1 capsule by mouth 2 (two) times daily.   oxyCODONE 5 MG immediate release tablet Commonly known as:  Oxy IR/ROXICODONE Take 5 mg by mouth every 6 (six) hours as needed.   pantoprazole 40 MG tablet Commonly known as:  PROTONIX take 1 tablet by mouth twice a day as directed   potassium chloride 10 MEQ tablet Commonly known as:  K-DUR take 1 tablet by mouth once daily   QUEtiapine 25 MG tablet Commonly known as:  SEROQUEL Take 1 tablet (25 mg total) by mouth at bedtime.   RA NASAL ALLERGY 55 MCG/ACT Aero nasal inhaler Generic drug:  triamcinolone instill 2 sprays into each nostril once daily   ranitidine 150 MG capsule Commonly known as:  ZANTAC take 1 capsule by mouth twice a day   sucralfate 1 g tablet Commonly known as:  CARAFATE Take 1 tablet (1 g total) by mouth 4 (four) times daily -  with meals and at bedtime.   traMADol 50 MG tablet Commonly known as:  ULTRAM take 1  tablet by mouth every 6 hours if needed for pain       Allergies:  Allergies  Allergen Reactions  . Ceftin [Cefuroxime Axetil] Anaphylaxis    Other reaction(s): Other (See Comments) REACTION: tongue and throat swell Other Reaction: TONGUE AND THROAT SWELLING REACTION: tongue and throat swell  . Lisinopril Anaphylaxis    REACTION: tongue and throat swelling (onset 10-10-09)  . Morphine Other (See Comments)    Per patient, low blood pressure issues, following morphine, that requires action to raise it back up. Drop in BP - can take small infrequent doses  . Aspirin Other (See Comments)    Sulfasalazine allergy cross reacts  . Antihistamines, Chlorpheniramine-Type     REACTION: makes pt hyper  . Antivert [Meclizine Hcl]     Other reaction(s): Unknown REACTION: bladder will not empty REACTION: bladder will not empty  . Decongestant [Pseudoephedrine Hcl]     Other reaction(s): Unknown REACTION: makes pt hyper REACTION: makes pt hyper  . Doxycycline     REACTION: GI upset  . Morphine And Related Other (See Comments)    Per patient, low blood pressure issues, following morphine, that requires action to raise it back up.  . Polymyxin B     Medication was in eye drops.  . Pseudoephedrine Hcl Other (See Comments)    REACTION: makes pt hyper  . Sulfa Antibiotics     Face swelling  . Xarelto [Rivaroxaban]     Other reaction(s): Other (See Comments) GI bleed Stomach burning, bleeding, and tar in stool GI bleed  . Adhesive [Tape] Rash  . Iodine Hives and Rash  . Levaquin [Levofloxacin In D5w] Rash  . Tetanus Toxoid Adsorbed Other (See Comments)    REACTION: rash, fever, hot to touch at injection site  . Tetanus Toxoids     REACTION: rash, fever, hot to touch at injection site    Family History: Family History  Problem Relation Age of Onset  . Rheum arthritis Mother   . Asthma Mother   . Parkinson's disease Mother   . Heart disease Mother   . Stroke Mother   .  Hypertension Mother   . Heart attack Father   . Heart disease Father   . Hypertension Father   . Diabetes Son   . Asthma Sister   .  Heart disease Sister   . Lung cancer Sister   . Heart disease Sister   . Heart disease Sister   . Breast cancer Sister   . Heart attack Sister   . Heart disease Brother   . Heart disease Maternal Grandmother   . Diabetes Maternal Grandmother   . Colon cancer Maternal Grandmother   . Heart disease Brother   . Alcohol abuse Brother   . Depression Brother     Social History:  reports that she quit smoking about 42 years ago. Her smoking use included Cigarettes. She has a 10.00 pack-year smoking history. She has never used smokeless tobacco. She reports that she does not drink alcohol or use drugs.  ROS: UROLOGY Frequent Urination?: No Hard to postpone urination?: No Burning/pain with urination?: No Get up at night to urinate?: No Leakage of urine?: No Urine stream starts and stops?: No Trouble starting stream?: No Do you have to strain to urinate?: No Blood in urine?: No Urinary tract infection?: No Sexually transmitted disease?: No Injury to kidneys or bladder?: No Painful intercourse?: No Weak stream?: No Currently pregnant?: No Vaginal bleeding?: No Last menstrual period?: n  Gastrointestinal Nausea?: No Vomiting?: No Indigestion/heartburn?: No Diarrhea?: No Constipation?: No  Constitutional Fever: No Night sweats?: No Weight loss?: No Fatigue?: Yes  Skin Skin rash/lesions?: No Itching?: No  Eyes Blurred vision?: No Double vision?: No  Ears/Nose/Throat Sore throat?: No Sinus problems?: No  Hematologic/Lymphatic Swollen glands?: No Easy bruising?: No  Cardiovascular Leg swelling?: No Chest pain?: No  Respiratory Cough?: No Shortness of breath?: No  Endocrine Excessive thirst?: No  Musculoskeletal Back pain?: No Joint pain?: No  Neurological Headaches?: No Dizziness?: No  Psychologic Depression?:  No Anxiety?: No  Physical Exam: BP 124/66   Pulse 70   Ht 5\' 4"  (1.626 m)   Wt 161 lb (73 kg)   BMI 27.64 kg/m   Constitutional:  Alert and oriented, No acute distress. HEENT: [place] AT, moist mucus membranes.  Trachea midline, no masses. Cardiovascular: No clubbing, cyanosis, or edema. Respiratory: Normal respiratory effort, no increased work of breathing. Obese. GI: Abdomen is soft, nontender, nondistended, no abdominal masses GU: No CVA tenderness.  Skin: No rashes, bruises or suspicious lesions. Neurologic: Grossly intact, no focal deficits, moving all 4 extremities. Ambulating slowly with walker. Psychiatric: Normal mood and affect.  Laboratory Data: Lab Results  Component Value Date   WBC 10.1 10/13/2016   HGB 12.8 10/13/2016   HCT 38.3 10/13/2016   MCV 82.9 10/13/2016   PLT 228 10/13/2016    Lab Results  Component Value Date   CREATININE 1.28 (H) 06/12/2016    Lab Results  Component Value Date   HGBA1C 5.7 (H) 05/07/2016    Urinalysis n/a  Pertinent Imaging: CLINICAL DATA:  Right nephrectomy.  Left renal mass.  EXAM: RENAL / URINARY TRACT ULTRASOUND COMPLETE  COMPARISON:  Abdominal CT 06/12/2016  FINDINGS: Right Kidney:  Surgically absent  Left Kidney:  Length: 10 cm. Echogenicity within normal limits. No mass or hydronephrosis visualized. Small cystic densities seen on prior CT are not visible on this study.  Bladder:  Appears normal for degree of bladder distention.  IMPRESSION: 1. Negative left renal sonography. Small left renal cystic densities on May 2018 CT are not visible today. 2. Right nephrectomy.   Electronically Signed   By: Monte Fantasia M.D.   On: 10/07/2016 15:01  Renal ultrasound personally reviewed.  Assessment & Plan:   1. Left renal mass Small hypoechoic density seen  on previous CT scan not visualized on renal ultrasound today.  We'll follow with renal ultrasound in 1 year in the setting of solitary  kidney.  2. Urinary frequency Given samples of Mybetriq 25 mg 4 weeks She'll call to let us know if this is effective, it still would like her to return to check postvoid residual while taking the medication to ensure no signs of retention or incomplete emptying Side effects discussed  3. Urge incontinence As above  4. History of urinary retention As above   Return in about 1 year (around 10/13/2017) for RUS, PVR, UA.  Hollice Espy, MD  [facility] [address] [place], [place] [address] [phone]

## 2016-10-14 ENCOUNTER — Ambulatory Visit: Payer: Self-pay | Admitting: Urology

## 2016-10-19 ENCOUNTER — Encounter: Payer: Self-pay | Admitting: Family Medicine

## 2016-10-19 ENCOUNTER — Ambulatory Visit (INDEPENDENT_AMBULATORY_CARE_PROVIDER_SITE_OTHER): Payer: Medicare Other | Admitting: Family Medicine

## 2016-10-19 ENCOUNTER — Telehealth: Payer: Self-pay

## 2016-10-19 VITALS — BP 130/64 | HR 73 | Temp 98.1°F | Resp 16 | Wt 159.0 lb

## 2016-10-19 DIAGNOSIS — M79672 Pain in left foot: Secondary | ICD-10-CM | POA: Diagnosis not present

## 2016-10-19 HISTORY — DX: Pain in left foot: M79.672

## 2016-10-19 NOTE — Telephone Encounter (Signed)
LVM for patient callback for results per Dr. Vicente Males.   CBC-normal , suggest repeat in 4 months and if drops will need capsule study

## 2016-10-19 NOTE — Progress Notes (Signed)
Subjective:  Patient ID: Kerri Carter, female    DOB: Mar 22, 1940  Age: 76 y.o. MRN: [id number]  CC: Left foot pain  HPI:  76 year old female with an extensive past medical history presents with the above complaint.  Patient has recently had back surgery. She states that shortly after her back surgery she developed foot pain. Started earlier in August. She reports left anterior foot pain and redness. She reports great toe pain and left shin pain. She has seen both her neurosurgeon and her rheumatologist. Rheumatologist performed an x-ray of her foot and also treat her empirically for gout. She has had no improvement. X-ray negative. Recent Neurosurgery note mentions possible circulatory issue. Patient and husband report that the foot had an area of blue coloration (? Bruising) which is now resolved.  Pain is worse with touch (minimal touch elicits severe pain) and ROM of great toe. No other associated symptoms. No other complaints or concerns at this time.  Social Hx   Social History   Social History  . Marital status: Married    Spouse name: N/A  . Number of children: 2  . Years of education: Some Coll   Occupational History  . Retired    Social History Main Topics  . Smoking status: Former Smoker    Packs/day: 0.50    Years: 20.00    Types: Cigarettes    Quit date: 02/02/1974  . Smokeless tobacco: Never Used  . Alcohol use No  . Drug use: No  . Sexual activity: Not Currently   Other Topics Concern  . None   Social History Narrative   Lives at home in [place] with her husband and grandson.   Right-handed.   6 cups coffee per day.    Review of Systems  Constitutional: Negative.   Musculoskeletal: Negative for back pain.       Left foot pain.   Objective:  BP 130/64 (BP Location: Left Arm, Patient Position: Sitting, Cuff Size: Normal)   Pulse 73   Temp 98.1 F (36.7 C) (Oral)   Resp 16   Wt 159 lb (72.1 kg)   SpO2 97%   BMI 27.29 kg/m   BP/Weight 10/19/2016  10/13/2016 6/96/7893  Systolic BP 810 175 102  Diastolic BP 64 66 54  Wt. (Lbs) 159 161 160.6  BMI 27.29 27.64 27.57  Some encounter information is confidential and restricted. Go to Review Flowsheets activity to see all data.    Physical Exam  Constitutional: She is oriented to person, place, and time. She appears well-developed. No distress.  Cardiovascular: Normal rate and regular rhythm.   Left foot 1+ DP and PT pulses.  Pulmonary/Chest: Effort normal. She has no wheezes. She has no rales.  Musculoskeletal:  Left foot - great toe tender to palpation at the MTP joint. No erythema in this area. Pain with range of motion. Dorsum of foot with an area of erythema. Warmth noted. Does not clinically appear to be cellulitis. Exquisitely sensitive and tender to palpation.   Neurological: She is alert and oriented to person, place, and time.  Psychiatric: She has a normal mood and affect.  Vitals reviewed.   Lab Results  Component Value Date   WBC 10.1 10/13/2016   HGB 12.8 10/13/2016   HCT 38.3 10/13/2016   PLT 228 10/13/2016   GLUCOSE 116 (H) 06/12/2016   CHOL 124 06/08/2016   TRIG 183.0 (H) 06/08/2016   HDL 44.80 06/08/2016   LDLCALC 43 06/08/2016   ALT 15 06/12/2016  AST 25 06/12/2016   NA 135 06/12/2016   K 4.0 06/12/2016   CL 101 06/12/2016   CREATININE 1.28 (H) 06/12/2016   BUN 16 06/12/2016   CO2 26 06/12/2016   TSH 3.441 05/07/2016   INR 0.9 07/03/2013   HGBA1C 5.7 (H) 05/07/2016    Assessment & Plan:   Problem List Items Addressed This Visit    Left foot pain - Primary    New problem. Uncertain etiology/prognosis at this time. Does not appear to be gout and she has had recent treatment. Does not appear infectious/cellulitis. Arranging vascular ultrasound to evaluate for peripheral vascular disease. Supportive care.      Relevant Orders   VAS [place] LOWER EXTREMITY ARTERIAL DUPLEX      Meds ordered this encounter  Medications  . Calcium  Carb-Cholecalciferol (CALTRATE 600+D3 SOFT) 600-800 MG-UNIT CHEW    Sig: Chew 2 tablets by mouth daily.  . mirabegron ER (MYRBETRIQ) 25 MG TB24 tablet    Sig: Take 25 mg by mouth daily.     Follow-up: PRN  [place]

## 2016-10-19 NOTE — Assessment & Plan Note (Signed)
New problem. Uncertain etiology/prognosis at this time. Does not appear to be gout and she has had recent treatment. Does not appear infectious/cellulitis. Arranging vascular ultrasound to evaluate for peripheral vascular disease. Supportive care.

## 2016-10-19 NOTE — Patient Instructions (Addendum)
You're doing well.  We will call regarding the [place].  Best of luck  Dr. Lacinda Axon

## 2016-10-20 ENCOUNTER — Other Ambulatory Visit (INDEPENDENT_AMBULATORY_CARE_PROVIDER_SITE_OTHER): Payer: Medicare Other

## 2016-10-20 DIAGNOSIS — M79672 Pain in left foot: Secondary | ICD-10-CM

## 2016-10-20 LAB — VAS US LOWER EXTREMITY ARTERIAL DUPLEX
LEFT PERO DIST SYS: 124 cm/s
Left ant tibial distal sys: 73 cm/s
Left popliteal dist sys PSV: -101 cm/s
Left popliteal prox sys PSV: 83 cm/s
Left super femoral dist sys PSV: -134 cm/s
Left super femoral mid sys PSV: -139 cm/s
Left super femoral prox sys PSV: 124 cm/s
RIGHT ANT DIST TIBAL SYS PSV: 98 cm/s
RIGHT POST TIB DIST SYS: 141 cm/s
Right peroneal sys PSV: 67 cm/s
Right popliteal dist sys PSV: -104 cm/s
Right popliteal prox sys PSV: 88 cm/s
Right super femoral dist sys PSV: -123 cm/s
Right super femoral mid sys PSV: -132 cm/s
Right super femoral prox sys PSV: -170 cm/s
left post tibial dist sys: -55 cm/s

## 2016-10-28 ENCOUNTER — Other Ambulatory Visit: Payer: Self-pay | Admitting: Neurosurgery

## 2016-10-28 DIAGNOSIS — G629 Polyneuropathy, unspecified: Secondary | ICD-10-CM | POA: Insufficient documentation

## 2016-10-28 DIAGNOSIS — Z981 Arthrodesis status: Secondary | ICD-10-CM

## 2016-10-30 ENCOUNTER — Other Ambulatory Visit: Payer: Self-pay | Admitting: Family Medicine

## 2016-10-31 ENCOUNTER — Inpatient Hospital Stay
Admission: EM | Admit: 2016-10-31 | Discharge: 2016-11-04 | DRG: 469 | Disposition: A | Discharge status: Skilled Nursing Facility | Payer: Medicare Other | Attending: Internal Medicine | Admitting: Internal Medicine

## 2016-10-31 ENCOUNTER — Emergency Department: Payer: Medicare Other

## 2016-10-31 DIAGNOSIS — F419 Anxiety disorder, unspecified: Secondary | ICD-10-CM | POA: Diagnosis present

## 2016-10-31 DIAGNOSIS — Z905 Acquired absence of kidney: Secondary | ICD-10-CM

## 2016-10-31 DIAGNOSIS — Z96649 Presence of unspecified artificial hip joint: Secondary | ICD-10-CM

## 2016-10-31 DIAGNOSIS — G4733 Obstructive sleep apnea (adult) (pediatric): Secondary | ICD-10-CM | POA: Diagnosis present

## 2016-10-31 DIAGNOSIS — Z79899 Other long term (current) drug therapy: Secondary | ICD-10-CM

## 2016-10-31 DIAGNOSIS — S72009A Fracture of unspecified part of neck of unspecified femur, initial encounter for closed fracture: Secondary | ICD-10-CM | POA: Diagnosis present

## 2016-10-31 DIAGNOSIS — T402X5A Adverse effect of other opioids, initial encounter: Secondary | ICD-10-CM | POA: Diagnosis not present

## 2016-10-31 DIAGNOSIS — S72011A Unspecified intracapsular fracture of right femur, initial encounter for closed fracture: Principal | ICD-10-CM | POA: Diagnosis present

## 2016-10-31 DIAGNOSIS — G9341 Metabolic encephalopathy: Secondary | ICD-10-CM | POA: Diagnosis not present

## 2016-10-31 DIAGNOSIS — M069 Rheumatoid arthritis, unspecified: Secondary | ICD-10-CM | POA: Diagnosis present

## 2016-10-31 DIAGNOSIS — S72001A Fracture of unspecified part of neck of right femur, initial encounter for closed fracture: Secondary | ICD-10-CM

## 2016-10-31 DIAGNOSIS — Z96642 Presence of left artificial hip joint: Secondary | ICD-10-CM | POA: Diagnosis present

## 2016-10-31 DIAGNOSIS — Z887 Allergy status to serum and vaccine status: Secondary | ICD-10-CM | POA: Diagnosis not present

## 2016-10-31 DIAGNOSIS — Z23 Encounter for immunization: Secondary | ICD-10-CM

## 2016-10-31 DIAGNOSIS — Z886 Allergy status to analgesic agent status: Secondary | ICD-10-CM | POA: Diagnosis not present

## 2016-10-31 DIAGNOSIS — H3530 Unspecified macular degeneration: Secondary | ICD-10-CM | POA: Diagnosis present

## 2016-10-31 DIAGNOSIS — Z87891 Personal history of nicotine dependence: Secondary | ICD-10-CM

## 2016-10-31 DIAGNOSIS — W010XXA Fall on same level from slipping, tripping and stumbling without subsequent striking against object, initial encounter: Secondary | ICD-10-CM | POA: Diagnosis present

## 2016-10-31 DIAGNOSIS — K219 Gastro-esophageal reflux disease without esophagitis: Secondary | ICD-10-CM | POA: Diagnosis present

## 2016-10-31 DIAGNOSIS — Z881 Allergy status to other antibiotic agents status: Secondary | ICD-10-CM | POA: Diagnosis not present

## 2016-10-31 DIAGNOSIS — N183 Chronic kidney disease, stage 3 (moderate): Secondary | ICD-10-CM | POA: Diagnosis present

## 2016-10-31 DIAGNOSIS — I340 Nonrheumatic mitral (valve) insufficiency: Secondary | ICD-10-CM | POA: Diagnosis present

## 2016-10-31 DIAGNOSIS — Z91048 Other nonmedicinal substance allergy status: Secondary | ICD-10-CM

## 2016-10-31 DIAGNOSIS — Z888 Allergy status to other drugs, medicaments and biological substances status: Secondary | ICD-10-CM | POA: Diagnosis not present

## 2016-10-31 DIAGNOSIS — I2510 Atherosclerotic heart disease of native coronary artery without angina pectoris: Secondary | ICD-10-CM | POA: Diagnosis present

## 2016-10-31 DIAGNOSIS — I129 Hypertensive chronic kidney disease with stage 1 through stage 4 chronic kidney disease, or unspecified chronic kidney disease: Secondary | ICD-10-CM | POA: Diagnosis present

## 2016-10-31 DIAGNOSIS — J449 Chronic obstructive pulmonary disease, unspecified: Secondary | ICD-10-CM | POA: Diagnosis present

## 2016-10-31 DIAGNOSIS — F329 Major depressive disorder, single episode, unspecified: Secondary | ICD-10-CM | POA: Diagnosis present

## 2016-10-31 DIAGNOSIS — Z882 Allergy status to sulfonamides status: Secondary | ICD-10-CM | POA: Diagnosis not present

## 2016-10-31 DIAGNOSIS — Z9981 Dependence on supplemental oxygen: Secondary | ICD-10-CM | POA: Diagnosis not present

## 2016-10-31 DIAGNOSIS — Z885 Allergy status to narcotic agent status: Secondary | ICD-10-CM

## 2016-10-31 DIAGNOSIS — E785 Hyperlipidemia, unspecified: Secondary | ICD-10-CM | POA: Diagnosis present

## 2016-10-31 HISTORY — DX: Fracture of unspecified part of neck of unspecified femur, initial encounter for closed fracture: S72.009A

## 2016-10-31 LAB — TYPE AND SCREEN
ABO/RH(D): AB POS
Antibody Screen: NEGATIVE

## 2016-10-31 LAB — CBC WITH DIFFERENTIAL/PLATELET
Basophils Absolute: 0.1 10*3/uL (ref 0–0.1)
Basophils Relative: 1 %
Eosinophils Absolute: 0.7 10*3/uL (ref 0–0.7)
Eosinophils Relative: 7 %
HCT: 34.4 % — ABNORMAL LOW (ref 35.0–47.0)
Hemoglobin: 11.4 g/dL — ABNORMAL LOW (ref 12.0–16.0)
Lymphocytes Relative: 29 %
Lymphs Abs: 2.8 10*3/uL (ref 1.0–3.6)
MCH: 27.8 pg (ref 26.0–34.0)
MCHC: 33.3 g/dL (ref 32.0–36.0)
MCV: 83.4 fL (ref 80.0–100.0)
Monocytes Absolute: 0.7 10*3/uL (ref 0.2–0.9)
Monocytes Relative: 7 %
Neutro Abs: 5.6 10*3/uL (ref 1.4–6.5)
Neutrophils Relative %: 56 %
Platelets: 191 10*3/uL (ref 150–440)
RBC: 4.12 MIL/uL (ref 3.80–5.20)
RDW: 15.9 % — ABNORMAL HIGH (ref 11.5–14.5)
WBC: 9.9 10*3/uL (ref 3.6–11.0)

## 2016-10-31 LAB — PROTIME-INR
INR: 0.99
Prothrombin Time: 13 seconds (ref 11.4–15.2)

## 2016-10-31 LAB — BASIC METABOLIC PANEL
Anion gap: 10 (ref 5–15)
BUN: 14 mg/dL (ref 6–20)
CO2: 26 mmol/L (ref 22–32)
Calcium: 9.1 mg/dL (ref 8.9–10.3)
Chloride: 101 mmol/L (ref 101–111)
Creatinine, Ser: 1.4 mg/dL — ABNORMAL HIGH (ref 0.44–1.00)
GFR calc Af Amer: 42 mL/min — ABNORMAL LOW (ref >60)
GFR calc non Af Amer: 36 mL/min — ABNORMAL LOW (ref >60)
Glucose, Bld: 129 mg/dL — ABNORMAL HIGH (ref 65–99)
Potassium: 4.1 mmol/L (ref 3.5–5.1)
Sodium: 137 mmol/L (ref 135–145)

## 2016-10-31 LAB — APTT: aPTT: 27 seconds (ref 24–36)

## 2016-10-31 MED ORDER — CLINDAMYCIN PHOSPHATE 600 MG/50ML IV SOLN
600.0000 mg | Freq: Once | INTRAVENOUS | Status: DC
  Filled 2016-10-31 (×2): qty 50

## 2016-10-31 MED ORDER — FUROSEMIDE 20 MG PO TABS
20.0000 mg | ORAL_TABLET | Freq: Every day | ORAL | Status: DC
  Administered 2016-11-02 – 2016-11-04 (×3): 20 mg via ORAL
  Filled 2016-10-31 (×3): qty 1

## 2016-10-31 MED ORDER — ACETAMINOPHEN 325 MG PO TABS: 650.0000 mg | ORAL_TABLET | Freq: Four times a day (QID) | ORAL | Status: DC | PRN

## 2016-10-31 MED ORDER — CALCIUM CARBONATE-VITAMIN D 500-200 MG-UNIT PO TABS
2.0000 | ORAL_TABLET | Freq: Every day | ORAL | Status: DC
  Administered 2016-11-02 – 2016-11-04 (×3): 2 via ORAL
  Filled 2016-10-31 (×3): qty 2

## 2016-10-31 MED ORDER — ACETAMINOPHEN 650 MG RE SUPP: 650.0000 mg | Freq: Four times a day (QID) | RECTAL | Status: DC | PRN

## 2016-10-31 MED ORDER — OXYCODONE HCL 5 MG PO TABS
5.0000 mg | ORAL_TABLET | Freq: Four times a day (QID) | ORAL | Status: DC | PRN
  Administered 2016-10-31 – 2016-11-01 (×2): 5 mg via ORAL
  Filled 2016-10-31 (×2): qty 1

## 2016-10-31 MED ORDER — ONDANSETRON HCL 4 MG/2ML IJ SOLN
INTRAMUSCULAR | Status: AC
  Administered 2016-10-31: 4 mg via INTRAVENOUS
  Filled 2016-10-31: qty 2

## 2016-10-31 MED ORDER — ALBUTEROL SULFATE (2.5 MG/3ML) 0.083% IN NEBU
2.5000 mg | INHALATION_SOLUTION | Freq: Four times a day (QID) | RESPIRATORY_TRACT | Status: DC | PRN
  Administered 2016-11-02 – 2016-11-03 (×4): 2.5 mg via RESPIRATORY_TRACT
  Filled 2016-10-31 (×3): qty 3

## 2016-10-31 MED ORDER — POTASSIUM CHLORIDE ER 10 MEQ PO TBCR
10.0000 meq | EXTENDED_RELEASE_TABLET | Freq: Every day | ORAL | Status: DC
  Administered 2016-11-01 – 2016-11-04 (×4): 10 meq via ORAL
  Filled 2016-10-31 (×8): qty 1

## 2016-10-31 MED ORDER — ESCITALOPRAM OXALATE 10 MG PO TABS
10.0000 mg | ORAL_TABLET | Freq: Every day | ORAL | Status: DC
  Administered 2016-11-01 – 2016-11-04 (×4): 10 mg via ORAL
  Filled 2016-10-31 (×4): qty 1

## 2016-10-31 MED ORDER — OCUVITE-LUTEIN PO CAPS
1.0000 | ORAL_CAPSULE | Freq: Two times a day (BID) | ORAL | Status: DC
  Administered 2016-11-01 – 2016-11-04 (×6): 1 via ORAL
  Filled 2016-10-31 (×9): qty 1

## 2016-10-31 MED ORDER — LORAZEPAM 0.5 MG PO TABS
0.5000 mg | ORAL_TABLET | Freq: Every day | ORAL | Status: DC
  Administered 2016-10-31 – 2016-11-03 (×4): 0.5 mg via ORAL
  Filled 2016-10-31 (×4): qty 1

## 2016-10-31 MED ORDER — FOLIC ACID 1 MG PO TABS
1.0000 mg | ORAL_TABLET | Freq: Every day | ORAL | Status: DC
  Administered 2016-11-02 – 2016-11-04 (×3): 1 mg via ORAL
  Filled 2016-10-31 (×3): qty 1

## 2016-10-31 MED ORDER — QUETIAPINE FUMARATE 25 MG PO TABS
25.0000 mg | ORAL_TABLET | Freq: Every day | ORAL | Status: DC
  Administered 2016-10-31 – 2016-11-03 (×4): 25 mg via ORAL
  Filled 2016-10-31 (×4): qty 1

## 2016-10-31 MED ORDER — FAMOTIDINE 20 MG PO TABS
10.0000 mg | ORAL_TABLET | Freq: Every day | ORAL | Status: DC
Start: 2016-11-01 — End: 2016-11-01

## 2016-10-31 MED ORDER — PANTOPRAZOLE SODIUM 40 MG PO TBEC
40.0000 mg | DELAYED_RELEASE_TABLET | Freq: Every day | ORAL | Status: DC
  Administered 2016-11-01 – 2016-11-04 (×4): 40 mg via ORAL
  Filled 2016-10-31 (×4): qty 1

## 2016-10-31 MED ORDER — MONTELUKAST SODIUM 10 MG PO TABS
10.0000 mg | ORAL_TABLET | Freq: Every day | ORAL | Status: DC
  Administered 2016-11-01 – 2016-11-04 (×4): 10 mg via ORAL
  Filled 2016-10-31 (×4): qty 1

## 2016-10-31 MED ORDER — CALCIUM CARB-CHOLECALCIFEROL 600-800 MG-UNIT PO CHEW: 2.0000 | CHEWABLE_TABLET | Freq: Every day | ORAL | Status: DC

## 2016-10-31 MED ORDER — LAMOTRIGINE 25 MG PO TABS
100.0000 mg | ORAL_TABLET | Freq: Two times a day (BID) | ORAL | Status: DC
  Administered 2016-10-31 – 2016-11-04 (×7): 100 mg via ORAL
  Filled 2016-10-31 (×8): qty 4

## 2016-10-31 MED ORDER — FENTANYL CITRATE (PF) 100 MCG/2ML IJ SOLN
75.0000 ug | Freq: Once | INTRAMUSCULAR | Status: AC
  Administered 2016-10-31: 75 ug via INTRAVENOUS
  Filled 2016-10-31: qty 2

## 2016-10-31 MED ORDER — INFLUENZA VAC SPLIT HIGH-DOSE 0.5 ML IM SUSY
0.5000 mL | PREFILLED_SYRINGE | INTRAMUSCULAR | Status: AC
  Administered 2016-11-03: 0.5 mL via INTRAMUSCULAR
  Filled 2016-10-31: qty 0.5

## 2016-10-31 MED ORDER — NEBIVOLOL HCL 10 MG PO TABS
10.0000 mg | ORAL_TABLET | Freq: Every day | ORAL | Status: DC
  Administered 2016-11-01 – 2016-11-04 (×4): 10 mg via ORAL
  Filled 2016-10-31 (×4): qty 1

## 2016-10-31 MED ORDER — ONDANSETRON HCL 4 MG/2ML IJ SOLN
4.0000 mg | Freq: Four times a day (QID) | INTRAMUSCULAR | Status: DC | PRN
  Administered 2016-10-31 – 2016-11-01 (×2): 4 mg via INTRAVENOUS

## 2016-10-31 MED ORDER — GABAPENTIN 300 MG PO CAPS
300.0000 mg | ORAL_CAPSULE | Freq: Four times a day (QID) | ORAL | Status: DC
  Administered 2016-10-31 – 2016-11-04 (×12): 300 mg via ORAL
  Filled 2016-10-31 (×12): qty 1

## 2016-10-31 MED ORDER — ONDANSETRON HCL 4 MG PO TABS
4.0000 mg | ORAL_TABLET | Freq: Four times a day (QID) | ORAL | Status: DC | PRN
Start: 2016-10-31 — End: 2016-11-01

## 2016-10-31 NOTE — ED Notes (Signed)
Floor not ready for report, will call back

## 2016-10-31 NOTE — ED Notes (Signed)
Pt transport to 144

## 2016-10-31 NOTE — ED Triage Notes (Signed)
Pt came to ED via EMS from home c/o right sided hip pain after trip and fall today. Given 72mcg fentanyl by EMS.

## 2016-10-31 NOTE — ED Provider Notes (Signed)
[facility] Emergency Department Provider Note  ____________________________________________   First MD Initiated Contact with Patient 10/31/16 1753     (approximate)  I have reviewed the triage vital signs and the nursing notes.   HISTORY  Chief Complaint Hip Pain   HPI Kerri Carter is a 76 y.o. female with a history of CAD as well as COPD who is presenting to the emergency department today after trip and fall in her driveway onto her right hip. She is unsure if she hit her head but denies any loss of consciousness. She is complaining of 10 of 10 pain to the right hip despite 75 g of fentanyl en route with EMS. Unable to ambulate after the fall. Says that her pain is anterior.  also with recent lumbar surgery 1 month ago according back brace.  Past Medical History:  Diagnosis Date  . Anxiety   . Asthma   . Chicken pox   . CKD (chronic kidney disease), stage III    a. s/p R nephrectomy.  . Conversion disorder   . COPD (chronic obstructive pulmonary disease) ([place])   . Depression   . Essential hypertension   . GERD (gastroesophageal reflux disease)   . Hyperlipidemia   . Inflammatory arthritis    a. hands/carpal tunnel.  b. Low titer rheumatoid factor. c. Negative anti-CCP antibodies. d. Plaquenil.  . Non-Obstructive CAD    a. 07/2009 Cath ([facility]): nonobs dzs;  b. 03/2011 Cath [facility]): nonobs dzs.  . Osteoarthritis    a. Knees.  . PUD (peptic ulcer disease)   . Toxic maculopathy   . Valvular heart disease    a. 07/2015 Echo: EF 55-60%, Mild AI, AS, MR, and TR.    Patient Active Problem List   Diagnosis Date Noted  . Left foot pain 10/19/2016  . Spinal stenosis of lumbar region 09/15/2016  . Osteoporosis 08/27/2016  . Prediabetes 08/25/2016  . Mixed bipolar I disorder ([place]) 10/09/2015  . CKD (chronic kidney disease), stage III   . Essential hypertension   . Conversion disorder 08/04/2015  . Hyperlipidemia 07/17/2015  . Toxic maculopathy  from plaquenil in therapeutic use 07/17/2015  . Macular degeneration 07/17/2015  . Insomnia 07/17/2015  . Rheumatoid arthritis ([place]) 12/05/2014  . Valvular heart disease 10/13/2011  . COPD (chronic obstructive pulmonary disease) ([place]) 09/09/2011  . OSA (obstructive sleep apnea) 09/09/2011  . Nocturnal hypoxemia 09/09/2011  . Gastroesophageal reflux disease 02/24/2011  . Single kidney 02/24/2011    Past Surgical History:  Procedure Laterality Date  . APPENDECTOMY    . BUNIONECTOMY    . CESAREAN SECTION     x1  . CHOLECYSTECTOMY N/A 05/11/2016   Procedure: LAPAROSCOPIC CHOLECYSTECTOMY;  Surgeon: Florene Glen, MD;  Location: [facility] ORS;  Service: General;  Laterality: N/A;  . COLONOSCOPY WITH PROPOFOL N/A 04/02/2016   Procedure: COLONOSCOPY WITH PROPOFOL;  Surgeon: Jonathon Bellows, MD;  Location: [facility] ENDOSCOPY;  Service: Endoscopy;  Laterality: N/A;  . ENDOSCOPIC RETROGRADE CHOLANGIOPANCREATOGRAPHY (ERCP) WITH PROPOFOL N/A 05/08/2016   Procedure: ENDOSCOPIC RETROGRADE CHOLANGIOPANCREATOGRAPHY (ERCP) WITH PROPOFOL;  Surgeon: Lucilla Lame, MD;  Location: [facility] ENDOSCOPY;  Service: Endoscopy;  Laterality: N/A;  . ESOPHAGEAL DILATION  04/02/2016   Procedure: ESOPHAGEAL DILATION;  Surgeon: Jonathon Bellows, MD;  Location: [facility] ENDOSCOPY;  Service: Endoscopy;;  . ESOPHAGOGASTRODUODENOSCOPY (EGD) WITH PROPOFOL N/A 04/02/2016   Procedure: ESOPHAGOGASTRODUODENOSCOPY (EGD) WITH PROPOFOL;  Surgeon: Jonathon Bellows, MD;  Location: [facility] ENDOSCOPY;  Service: Endoscopy;  Laterality: N/A;  . 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 TONSILLECTOMY    . TOTAL HIP ARTHROPLASTY  12/10/11   [facility] left hip  . TUBAL LIGATION      Prior to Admission medications   Medication Sig Start Date End Date Taking? Authorizing Provider  albuterol (VENTOLIN HFA) 108 (90 Base) MCG/ACT inhaler Inhale 2 puffs into the lungs every 6 (six) hours as needed. 11/25/15   Juanito Doom, MD  BREO  ELLIPTA 100-25 MCG/INH AEPB Inhale 1 puff into the lungs daily.  02/04/16   [provider]  BYSTOLIC 10 MG tablet take 1 tablet by mouth once daily 07/28/16   Juanito Doom, MD  Calcium Carb-Cholecalciferol (CALTRATE 600+D3 SOFT) 600-800 MG-UNIT CHEW Chew 2 tablets by mouth daily.    [provider]  Calcium-Vitamin D-Vitamin K (CALCIUM SOFT CHEWS) 790-240-97 MG-UNT-MCG CHEW Chew by mouth.    [provider]  docusate (COLACE) 50 MG/5ML liquid Take 100 mg by mouth daily as needed.     [provider]  escitalopram (LEXAPRO) 10 MG tablet Take 1 tablet (10 mg total) by mouth daily. 07/29/16   Rainey Pines, MD  folic acid (FOLVITE) 1 MG tablet Take 1 mg by mouth daily.    [provider]  furosemide (LASIX) 20 MG tablet take 1 tablet by mouth once daily 09/09/15   Cook, Jayce G, DO  gabapentin (NEURONTIN) 300 MG capsule TAKE ONE CAPSULE BY MOUTH FOUR TIMES DAILY 10/30/16   Coral Spikes, DO  lamoTRIgine (LAMICTAL) 100 MG tablet Take 1 tablet (100 mg total) by mouth 2 (two) times daily. 07/29/16   Rainey Pines, MD  leflunomide (ARAVA) 10 MG tablet  08/12/16   [provider]  LORazepam (ATIVAN) 0.5 MG tablet Take 1 tablet (0.5 mg total) by mouth at bedtime. 07/29/16   Rainey Pines, MD  lovastatin (MEVACOR) 20 MG tablet Take 1 tablet (20 mg total) by mouth at bedtime. 10/22/15   Coral Spikes, DO  mirabegron ER (MYRBETRIQ) 25 MG TB24 tablet Take 25 mg by mouth daily.    [provider]  montelukast (SINGULAIR) 10 MG tablet take 1 tablet by mouth once daily 05/11/16   Thersa Salt G, DO  multivitamin-lutein (OCUVITE-LUTEIN) CAPS capsule Take 1 capsule by mouth 2 (two) times daily.    [provider]  oxyCODONE (OXY IR/ROXICODONE) 5 MG immediate release tablet Take 5 mg by mouth every 6 (six) hours as needed.     [provider]  pantoprazole (PROTONIX) 40 MG tablet take 1 tablet by mouth twice a day as directed 04/14/16   Coral Spikes, DO  potassium chloride (K-DUR) 10 MEQ tablet take 1 tablet by mouth once daily 09/09/15   Thersa Salt G, DO  QUEtiapine (SEROQUEL) 25 MG tablet Take 1 tablet (25 mg total) by mouth at bedtime. 07/29/16   Rainey Pines, MD  RA NASAL ALLERGY 55 MCG/ACT AERO nasal inhaler instill 2 sprays into each nostril once daily 01/22/16   [provider]  ranitidine (ZANTAC) 150 MG capsule take 1 capsule by mouth twice a day 07/31/16   Crecencio Mc, MD  sucralfate (CARAFATE) 1 g tablet Take 1 tablet (1 g total) by mouth 4 (four) times daily -  with meals and at bedtime. Patient taking differently: Take 1 g by mouth 4 (four) times daily as needed.  10/13/16 11/12/16  Jonathon Bellows, MD  Teriparatide, Recombinant, (FORTEO) 600 MCG/2.4ML SOLN Inject 0.8 mLs into the skin  daily.    [provider]  traMADol (ULTRAM) 50 MG tablet take 1 tablet by mouth every 6 hours if needed for pain 07/27/16   [provider]    Allergies Ceftin [cefuroxime axetil]; Lisinopril; Morphine; Aspirin; Antihistamines, chlorpheniramine-type; Antivert [meclizine hcl]; Decongestant [pseudoephedrine hcl]; Doxycycline; Morphine and related; Polymyxin b; Pseudoephedrine hcl; Sulfa antibiotics; Xarelto [rivaroxaban]; Adhesive [tape]; Iodine; Levaquin [levofloxacin in d5w]; Tetanus toxoid adsorbed; and Tetanus toxoids  Family History  Problem Relation Age of Onset  . Rheum arthritis Mother   . Asthma Mother   . Parkinson's disease Mother   . Heart disease Mother   . Stroke Mother   . Hypertension Mother   . Heart attack Father   . Heart disease Father   . Hypertension Father   . Diabetes Son   . Asthma Sister   . Heart disease Sister   . Lung cancer Sister   . Heart disease Sister   . Heart disease Sister   . Breast cancer Sister   . Heart attack Sister   . Heart disease Brother   . Heart disease Maternal Grandmother   . Diabetes Maternal Grandmother   . Colon cancer Maternal Grandmother   . Heart  disease Brother   . Alcohol abuse Brother   . Depression Brother     Social History Social History  Substance Use Topics  . Smoking status: Former Smoker    Packs/day: 0.50    Years: 20.00    Types: Cigarettes    Quit date: 02/02/1974  . Smokeless tobacco: Never Used  . Alcohol use No    Review of Systems  Constitutional: No fever/chills Eyes: No visual changes. ENT: No sore throat. Cardiovascular: Denies chest pain. Respiratory: Denies shortness of breath. Gastrointestinal: No abdominal pain.  No nausea, no vomiting.  No diarrhea.  No constipation. Genitourinary: Negative for dysuria. Musculoskeletal: no complaints of low back pain at this time. Skin: Negative for rash. Neurological: Negative for headaches, focal weakness or numbness.   ____________________________________________   PHYSICAL EXAM:  VITAL SIGNS: ED Triage Vitals  Enc Vitals Group     BP 10/31/16 1753 (!) 154/68     Pulse Rate 10/31/16 1753 75     Resp 10/31/16 1753 20     Temp 10/31/16 1753 97.9 F (36.6 C)     Temp Source 10/31/16 1753 Oral     SpO2 10/31/16 1753 96 %     Weight 10/31/16 1751 160 lb (72.6 kg)     Height 10/31/16 1751 5\' 4"  (1.626 m)     Head Circumference --      Peak Flow --      Pain Score 10/31/16 1751 10     Pain Loc --      Pain Edu? --      Excl. in [place]? --     Constitutional: Alert and oriented. Well appearing and in no acute distress. Eyes: Conjunctivae are normal.  Head: Atraumatic. Nose: No congestion/rhinnorhea. Mouth/Throat: Mucous membranes are moist.  Neck: No stridor.  no tenderness palpation to the midline cervical spine. No deformity or step-off. Cardiovascular: Normal rate, regular rhythm. Grossly normal heart sounds.   Respiratory: Normal respiratory effort.  No retractions. Lungs CTAB. Gastrointestinal: Soft and nontender. No distention. Musculoskeletal: patient's righthip is held in flexion and there is tenderness palpation anteriorly but without any  deformity. Intact dorsalis pedis pulseto the right foot. Sensation is intact to light touch. Neurologic:  Normal speech and language. No gross focal neurologic deficits are appreciated.  Skin:  Skin is warm, dry and intact. No rash noted. Psychiatric: Mood and affect are normal. Speech and behavior are normal.  ____________________________________________   LABS (all labs ordered are listed, but only abnormal results are displayed)  Labs Reviewed - No data to display ____________________________________________  EKG  ED ECG REPORT I, Myleigh Amara,  Youlanda Roys, the attending physician, personally viewed and interpreted this ECG.   Date: 10/31/2016  EKG Time: 1756  Rate: 72  Rhythm: normal sinus rhythm  Axis: normal  Intervals:none  ST&T Change: no ST segment elevation or depression. Single T-wave inversion in aVL.  ____________________________________________  RADIOLOGY  no acute cardiopulmonary abnormality seen on chest x-ray. Acute nondisplaced impacted subcapital right femoral neck fracture.  CT head without any acute process. ____________________________________________   PROCEDURES  Procedure(s) performed:   Procedures  Critical Care performed:   ____________________________________________   INITIAL IMPRESSION / ASSESSMENT AND PLAN / ED COURSE  Pertinent labs & imaging results that were available during my care of the patient were reviewed by me and considered in my medical decision making (see chart for details).  DDX: Pelvic fracture, hip fracture, fall, contusion, subdural hemorrhage.    ----------------------------------------- 7:13 PM on 10/31/2016 -----------------------------------------  Patient with right-sided hip fracture. Discussed case with the orthopedist, Dr. Roland Rack.  Orthopedics to consult. Signed out to the hospitalist, Dr. Posey Pronto. The patient as well as family or where the diagnosis and need for admission. The patient is requesting more pain  medicine. We will give an additional 75 mcg of final.  ____________________________________________   FINAL CLINICAL IMPRESSION(S) / ED DIAGNOSES  hip fracture.    NEW MEDICATIONS STARTED DURING THIS VISIT:  New Prescriptions   No medications on file     Note:  This document was prepared using Dragon voice recognition software and may include unintentional dictation errors.     Orbie Pyo, MD 10/31/16 [phone]

## 2016-10-31 NOTE — H&P (Addendum)
[facility] [place] NAME: Kerri Carter    MR#:  [id number]  DATE OF BIRTH:  07/23/1940  DATE OF ADMISSION:  10/31/2016  PRIMARY CARE PHYSICIAN: Coral Spikes, DO   REQUESTING/REFERRING PHYSICIAN: Dr. Dineen Kid  CHIEF COMPLAINT:  Right hip pain after fall today  HISTORY OF PRESENT ILLNESS:  Kerri Carter  is a 76 y.o. female with a known history of inflammatory arthritis on Arava and Forteo,COPD, hypertension, hyperlipidemia comes in the emergency room after she sustained a mechanical fall while trying to get out of the car this afternoon. Patient complains of right hip pain and was found to have acute nondisplaced right femoral subcapital fracture Patient is being admitted for further eval and management. Patient has history of mild MI and MR. No history of coronary artery disease. Denies any chest pain shortness of breath. Denies any complications related to anesthesia.   PAST MEDICAL HISTORY:   Past Medical History:  Diagnosis Date  . Anxiety   . Asthma   . Chicken pox   . CKD (chronic kidney disease), stage III    a. s/p R nephrectomy.  . Conversion disorder   . COPD (chronic obstructive pulmonary disease) ([place])   . Depression   . Essential hypertension   . GERD (gastroesophageal reflux disease)   . Hyperlipidemia   . Inflammatory arthritis    a. hands/carpal tunnel.  b. Low titer rheumatoid factor. c. Negative anti-CCP antibodies. d. Plaquenil.  . Non-Obstructive CAD    a. 07/2009 Cath ([facility]): nonobs dzs;  b. 03/2011 Cath [facility]): nonobs dzs.  . Osteoarthritis    a. Knees.  . PUD (peptic ulcer disease)   . Toxic maculopathy   . Valvular heart disease    a. 07/2015 Echo: EF 55-60%, Mild AI, AS, MR, and TR.    PAST SURGICAL HISTOIRY:   Past Surgical History:  Procedure Laterality Date  . APPENDECTOMY    . BUNIONECTOMY    . CESAREAN SECTION     x1  . CHOLECYSTECTOMY N/A 05/11/2016   Procedure: LAPAROSCOPIC  CHOLECYSTECTOMY;  Surgeon: Florene Glen, MD;  Location: [facility] ORS;  Service: General;  Laterality: N/A;  . COLONOSCOPY WITH PROPOFOL N/A 04/02/2016   Procedure: COLONOSCOPY WITH PROPOFOL;  Surgeon: Jonathon Bellows, MD;  Location: [facility] ENDOSCOPY;  Service: Endoscopy;  Laterality: N/A;  . ENDOSCOPIC RETROGRADE CHOLANGIOPANCREATOGRAPHY (ERCP) WITH PROPOFOL N/A 05/08/2016   Procedure: ENDOSCOPIC RETROGRADE CHOLANGIOPANCREATOGRAPHY (ERCP) WITH PROPOFOL;  Surgeon: Lucilla Lame, MD;  Location: [facility] ENDOSCOPY;  Service: Endoscopy;  Laterality: N/A;  . ESOPHAGEAL DILATION  04/02/2016   Procedure: ESOPHAGEAL DILATION;  Surgeon: Jonathon Bellows, MD;  Location: [facility] ENDOSCOPY;  Service: Endoscopy;;  . ESOPHAGOGASTRODUODENOSCOPY (EGD) WITH PROPOFOL N/A 04/02/2016   Procedure: ESOPHAGOGASTRODUODENOSCOPY (EGD) WITH PROPOFOL;  Surgeon: Jonathon Bellows, MD;  Location: [facility] ENDOSCOPY;  Service: Endoscopy;  Laterality: N/A;  . 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 TONSILLECTOMY    . TOTAL HIP ARTHROPLASTY  12/10/11   [facility] left hip  . TUBAL LIGATION      SOCIAL HISTORY:   Social History  Substance Use Topics  . Smoking status: Former Smoker    Packs/day: 0.50    Years: 20.00    Types: Cigarettes    Quit date: 02/02/1974  . Smokeless tobacco: Never Used  . Alcohol use No    FAMILY HISTORY:   Family History  Problem Relation Age of Onset  . Rheum arthritis  Mother   . Asthma Mother   . Parkinson's disease Mother   . Heart disease Mother   . Stroke Mother   . Hypertension Mother   . Heart attack Father   . Heart disease Father   . Hypertension Father   . Diabetes Son   . Asthma Sister   . Heart disease Sister   . Lung cancer Sister   . Heart disease Sister   . Heart disease Sister   . Breast cancer Sister   . Heart attack Sister   . Heart disease Brother   . Heart disease Maternal Grandmother   . Diabetes Maternal Grandmother   . Colon cancer Maternal  Grandmother   . Heart disease Brother   . Alcohol abuse Brother   . Depression Brother     DRUG ALLERGIES:   Allergies  Allergen Reactions  . Ceftin [Cefuroxime Axetil] Anaphylaxis    Other reaction(s): Other (See Comments) REACTION: tongue and throat swell Other Reaction: TONGUE AND THROAT SWELLING REACTION: tongue and throat swell  . Lisinopril Anaphylaxis    REACTION: tongue and throat swelling (onset 10-10-09)  . Morphine Other (See Comments)    Per patient, low blood pressure issues, following morphine, that requires action to raise it back up. Drop in BP - can take small infrequent doses  . Aspirin Other (See Comments)    Sulfasalazine allergy cross reacts  . Antihistamines, Chlorpheniramine-Type     REACTION: makes pt hyper  . Antivert [Meclizine Hcl]     Other reaction(s): Unknown REACTION: bladder will not empty REACTION: bladder will not empty  . Decongestant [Pseudoephedrine Hcl]     Other reaction(s): Unknown REACTION: makes pt hyper REACTION: makes pt hyper  . Doxycycline     REACTION: GI upset  . Morphine And Related Other (See Comments)    Per patient, low blood pressure issues, following morphine, that requires action to raise it back up.  . Polymyxin B     Medication was in eye drops.  . Pseudoephedrine Hcl Other (See Comments)    REACTION: makes pt hyper  . Sulfa Antibiotics     Face swelling  . Xarelto [Rivaroxaban]     Other reaction(s): Other (See Comments) GI bleed Stomach burning, bleeding, and tar in stool GI bleed  . Adhesive [Tape] Rash  . Iodine Hives and Rash  . Levaquin [Levofloxacin In D5w] Rash  . Tetanus Toxoid Adsorbed Other (See Comments)    REACTION: rash, fever, hot to touch at injection site  . Tetanus Toxoids     REACTION: rash, fever, hot to touch at injection site    REVIEW OF SYSTEMS:  Review of Systems  Constitutional: Negative for chills, fever and weight loss.  HENT: Negative for ear discharge, ear pain and  nosebleeds.   Eyes: Negative for blurred vision, pain and discharge.  Respiratory: Negative for sputum production, shortness of breath, wheezing and stridor.   Cardiovascular: Negative for chest pain, palpitations, orthopnea and PND.  Gastrointestinal: Negative for abdominal pain, diarrhea, nausea and vomiting.  Genitourinary: Negative for frequency and urgency.  Musculoskeletal: Positive for falls and joint pain. Negative for back pain.  Neurological: Positive for weakness. Negative for sensory change, speech change and focal weakness.  Psychiatric/Behavioral: Negative for depression and hallucinations. The patient is not nervous/anxious.      MEDICATIONS AT HOME:   Prior to Admission medications   Medication Sig Start Date End Date Taking? Authorizing Provider  BYSTOLIC 10 MG tablet take 1 tablet by mouth once daily  07/28/16  Yes Juanito Doom, MD  Calcium-Vitamin D-Vitamin K (CALCIUM SOFT CHEWS) 301-601-09 MG-UNT-MCG CHEW Chew by mouth.   Yes [provider]  escitalopram (LEXAPRO) 10 MG tablet Take 1 tablet (10 mg total) by mouth daily. 07/29/16  Yes Rainey Pines, MD  folic acid (FOLVITE) 1 MG tablet Take 1 mg by mouth daily.   Yes [provider]  furosemide (LASIX) 20 MG tablet take 1 tablet by mouth once daily 09/09/15  Yes Cook, Jayce G, DO  gabapentin (NEURONTIN) 300 MG capsule TAKE ONE CAPSULE BY MOUTH FOUR TIMES DAILY Patient taking differently: TAKE two CAPSULE BY MOUTH two TIMES DAILY 10/30/16  Yes Cook, Jayce G, DO  lamoTRIgine (LAMICTAL) 100 MG tablet Take 1 tablet (100 mg total) by mouth 2 (two) times daily. 07/29/16  Yes Rainey Pines, MD  leflunomide (ARAVA) 10 MG tablet  08/12/16  Yes [provider]  LORazepam (ATIVAN) 0.5 MG tablet Take 1 tablet (0.5 mg total) by mouth at bedtime. 07/29/16  Yes Rainey Pines, MD  lovastatin (MEVACOR) 20 MG tablet Take 1 tablet (20 mg total) by mouth at bedtime. 10/22/15  Yes Cook, Jayce G, DO  montelukast  (SINGULAIR) 10 MG tablet take 1 tablet by mouth once daily 05/11/16  Yes Cook, Jayce G, DO  multivitamin-lutein (OCUVITE-LUTEIN) CAPS capsule Take 1 capsule by mouth 2 (two) times daily.   Yes [provider]  pantoprazole (PROTONIX) 40 MG tablet take 1 tablet by mouth twice a day as directed 04/14/16  Yes Cook, Jayce G, DO  potassium chloride (K-DUR) 10 MEQ tablet take 1 tablet by mouth once daily 09/09/15  Yes Cook, Jayce G, DO  QUEtiapine (SEROQUEL) 25 MG tablet Take 1 tablet (25 mg total) by mouth at bedtime. 07/29/16  Yes Rainey Pines, MD  ranitidine (ZANTAC) 150 MG capsule take 1 capsule by mouth twice a day 07/31/16  Yes Crecencio Mc, MD  Teriparatide, Recombinant, (FORTEO) 600 MCG/2.4ML SOLN Inject 0.8 mLs into the skin daily.   Yes [provider]  traMADol (ULTRAM) 50 MG tablet take 1 tablet by mouth every 6 hours if needed for pain 07/27/16  Yes [provider]  albuterol (VENTOLIN HFA) 108 (90 Base) MCG/ACT inhaler Inhale 2 puffs into the lungs every 6 (six) hours as needed. 11/25/15   Juanito Doom, MD  Calcium Carb-Cholecalciferol (CALTRATE 600+D3 SOFT) 600-800 MG-UNIT CHEW Chew 2 tablets by mouth daily.    [provider]  oxyCODONE (OXY IR/ROXICODONE) 5 MG immediate release tablet Take 5 mg by mouth every 6 (six) hours as needed.     [provider]  RA NASAL ALLERGY 55 MCG/ACT AERO nasal inhaler instill 2 sprays into each nostril once daily 01/22/16   [provider]  sucralfate (CARAFATE) 1 g tablet Take 1 tablet (1 g total) by mouth 4 (four) times daily -  with meals and at bedtime. Patient taking differently: Take 1 g by mouth as needed.  10/13/16 11/12/16  Jonathon Bellows, MD      VITAL SIGNS:  Blood pressure (!) 154/68, pulse 75, temperature 97.9 F (36.6 C), temperature source Oral, resp. rate 20, height 5\' 4"  (1.626 m), weight 72.6 kg (160 lb), SpO2 96 %.  PHYSICAL EXAMINATION:  GENERAL:  76 y.o.-year-old patient lying in  the bed with no acute distress.  EYES: Pupils equal, round, reactive to light and accommodation. No scleral icterus. Extraocular muscles intact.  HEENT: Head atraumatic, normocephalic. Oropharynx and nasopharynx clear.  NECK:  Supple, no jugular  venous distention. No thyroid enlargement, no tenderness.  LUNGS: Normal breath sounds bilaterally, no wheezing, rales,rhonchi or crepitation. No use of accessory muscles of respiration.  CARDIOVASCULAR: S1, S2 normal. No murmurs, rubs, or gallops.  ABDOMEN: Soft, nontender, nondistended. Bowel sounds present. No organomegaly or mass.  EXTREMITIES: No pedal edema, cyanosis, or clubbing. Right leg externally rotated. Decreased range of motion. NEUROLOGIC: Cranial nerves II through XII are intact. Muscle strength 5/5 in all extremities. Sensation intact. Gait not checked.  PSYCHIATRIC: The patient is alert and oriented x 3.  SKIN: No obvious rash, lesion, or ulcer.   LABORATORY PANEL:   CBC  Recent Labs Lab 10/31/16 1828  WBC 9.9  HGB 11.4*  HCT 34.4*  PLT 191   ------------------------------------------------------------------------------------------------------------------  Chemistries   Recent Labs Lab 10/31/16 1828  NA 137  K 4.1  CL 101  CO2 26  GLUCOSE 129*  BUN 14  CREATININE 1.40*  CALCIUM 9.1   ------------------------------------------------------------------------------------------------------------------  Cardiac Enzymes No results for input(s): TROPONINI in the last 168 hours. ------------------------------------------------------------------------------------------------------------------  RADIOLOGY:  Dg Chest 1 View  Result Date: 10/31/2016 CLINICAL DATA:  Right hip pain after fall. EXAM: CHEST 1 VIEW COMPARISON:  None. FINDINGS: The heart size and mediastinal contours are within normal limits. Both lungs are clear. No pneumothorax or pleural effusion is noted. The visualized skeletal structures are unremarkable.  IMPRESSION: No acute cardiopulmonary abnormality seen. Electronically Signed   By: Marijo Conception, M.D.   On: 10/31/2016 18:51   Ct Head Wo Contrast  Result Date: 10/31/2016 CLINICAL DATA:  Fall at home today. Head injury. Right hip fracture. EXAM: CT HEAD WITHOUT CONTRAST TECHNIQUE: Contiguous axial images were obtained from the base of the skull through the vertex without intravenous contrast. COMPARISON:  07/13/2015 brain MRI. FINDINGS: Brain: No evidence of parenchymal hemorrhage or extra-axial fluid collection. No mass lesion, mass effect, or midline shift. No CT evidence of acute infarction. Nonspecific mild subcortical and periventricular white matter hypodensity, most in keeping with chronic small vessel ischemic change. Generalized cerebral volume loss. Cerebral ventricle sizes are stable and concordant with the degree of cerebral volume loss. Vascular: No acute abnormality. Skull: No evidence of calvarial fracture. Sinuses/Orbits: Mild mucoperiosteal thickening throughout the paranasal sinuses without fluid levels. Other: Small right posterior parietal scalp contusion. The mastoid air cells are unopacified. IMPRESSION: 1. Small right posterior parietal scalp contusion. 2. No evidence of acute intracranial abnormality. No evidence of calvarial fracture. 3. Generalized cerebral volume loss and mild chronic small vessel ischemic changes. Electronically Signed   By: Ilona Sorrel M.D.   On: 10/31/2016 19:26   Dg Hip Unilat W Or Wo Pelvis 2-3 Views Right  Result Date: 10/31/2016 CLINICAL DATA:  Fall.  Right hip pain. EXAM: DG HIP (WITH OR WITHOUT PELVIS) 2-3V RIGHT COMPARISON:  06/12/2016 CT abdomen/pelvis. FINDINGS: Partially visualized left total hip arthroplasty, with no evidence of hardware fracture or loosening. There is an acute impacted subcapital right femoral neck fracture, nondisplaced. No right hip dislocation. No suspicious focal osseous lesions. No additional osseous fractures. No pelvic  diastasis. Bilateral posterior lower lumbar fusion hardware. IMPRESSION: Acute nondisplaced impacted subcapital right femoral neck fracture. Electronically Signed   By: Ilona Sorrel M.D.   On: 10/31/2016 18:52    EKG:   Normal sinus rhythm IMPRESSION AND PLAN:  Sevana Grandinetti  is a 76 y.o. female with a known history of inflammatory arthritis on Arava and Forteo,COPD, hypertension, hyperlipidemia comes in the emergency room after she sustained a mechanical fall while  trying to get out of the car this afternoon. Patient complains of right hip pain and was found to have acute nondisplaced right femoral subcapital fracture Patient is being admitted for further eval and management.  1. Acute nondisplacedright femoral neck fracture status post mechanical fall -admit to medical floor -Orthopedic consultation. Discussed with Dr. Roland Rack -Patient is at intermediate risk for surgery. -Patient denies any chest pain shortness of breath. -We'll resume patient's home meds  2. History of mild AI and MR -Follows with [facility] cardiology -Echo shows EF of 60% in 2017 -Patient has no history of coronary artery disease had cardiac catheterization in the past -Resume cardiac meds  3. Hypertension -Continue bystolic  4. Hyperlipidemia -Continue Mevacor  5. DVT prophylaxis -Patient is pending surgery and will do SCD and teds -DVT prophylaxis will be ordered by orthopedic surgeon after surgery  6. History of inflammatory arthritis -Resume Forteo and  arava after surgery.  Above was discussed with patient and patient's husband and family    All the records are reviewed and case discussed with ED provider. Management plans discussed with the patient, family and they are in agreement.  CODE STATUS: Full code discussed with patient  TOTAL TIME TAKING CARE OF THIS PATIENT:  50 minutes.    Jeda Pardue M.D on 10/31/2016 at 7:29 PM  Between 7am to 6pm - Pager - [phone]  After 6pm go to [email or web address]  - password [email or web address] [facility]  Office  [phone]  CC: Primary care physician; Coral Spikes, DO

## 2016-11-01 ENCOUNTER — Inpatient Hospital Stay: Payer: Medicare Other | Admitting: Anesthesiology

## 2016-11-01 ENCOUNTER — Inpatient Hospital Stay: Payer: Medicare Other

## 2016-11-01 ENCOUNTER — Encounter: Payer: Self-pay | Admitting: Surgery

## 2016-11-01 ENCOUNTER — Encounter
Admission: EM | Disposition: A | Discharge status: Skilled Nursing Facility | Payer: Self-pay | Source: Home / Self Care | Attending: Internal Medicine

## 2016-11-01 HISTORY — PX: HIP ARTHROPLASTY: SHX981

## 2016-11-01 LAB — SURGICAL PCR SCREEN
MRSA, PCR: NEGATIVE
Staphylococcus aureus: NEGATIVE

## 2016-11-01 SURGERY — HEMIARTHROPLASTY, HIP, DIRECT ANTERIOR APPROACH, FOR FRACTURE
Anesthesia: Spinal | Laterality: Right

## 2016-11-01 MED ORDER — TRANEXAMIC ACID 1000 MG/10ML IV SOLN
INTRAVENOUS | Status: DC | PRN
  Administered 2016-11-01: 1000 mg

## 2016-11-01 MED ORDER — DIPHENHYDRAMINE HCL 12.5 MG/5ML PO ELIX: 12.5000 mg | ORAL_SOLUTION | ORAL | Status: DC | PRN

## 2016-11-01 MED ORDER — SODIUM CHLORIDE 0.9 % IV SOLN
INTRAVENOUS | Status: DC | PRN
  Administered 2016-11-01: 50 ug/min via INTRAVENOUS

## 2016-11-01 MED ORDER — ENOXAPARIN SODIUM 40 MG/0.4ML ~~LOC~~ SOLN
40.0000 mg | SUBCUTANEOUS | Status: DC
  Administered 2016-11-02 – 2016-11-04 (×3): 40 mg via SUBCUTANEOUS
  Filled 2016-11-01 (×3): qty 0.4

## 2016-11-01 MED ORDER — SODIUM CHLORIDE 0.9 % IR SOLN
Status: DC | PRN
  Administered 2016-11-01: 50 mL

## 2016-11-01 MED ORDER — METOCLOPRAMIDE HCL 5 MG/ML IJ SOLN: 5.0000 mg | Freq: Three times a day (TID) | INTRAMUSCULAR | Status: DC | PRN

## 2016-11-01 MED ORDER — CALCIUM-VITAMIN D-VITAMIN K 500-500-40 MG-UNT-MCG PO CHEW: 1.0000 | CHEWABLE_TABLET | Freq: Every day | ORAL | Status: DC

## 2016-11-01 MED ORDER — KETAMINE HCL 50 MG/ML IJ SOLN
INTRAMUSCULAR | Status: DC | PRN
  Administered 2016-11-01: 50 mg via INTRAVENOUS

## 2016-11-01 MED ORDER — KCL IN DEXTROSE-NACL 20-5-0.9 MEQ/L-%-% IV SOLN
INTRAVENOUS | Status: DC
  Administered 2016-11-01 – 2016-11-02 (×2): via INTRAVENOUS
  Filled 2016-11-01 (×3): qty 1000

## 2016-11-01 MED ORDER — BISACODYL 10 MG RE SUPP
10.0000 mg | Freq: Every day | RECTAL | Status: DC | PRN
  Administered 2016-11-03: 10 mg via RECTAL
  Filled 2016-11-01: qty 1

## 2016-11-01 MED ORDER — PRAVASTATIN SODIUM 20 MG PO TABS
20.0000 mg | ORAL_TABLET | Freq: Every day | ORAL | Status: DC
  Administered 2016-11-01 – 2016-11-03 (×2): 20 mg via ORAL
  Filled 2016-11-01 (×2): qty 1

## 2016-11-01 MED ORDER — GLYCOPYRROLATE 0.2 MG/ML IJ SOLN
INTRAMUSCULAR | Status: AC
  Filled 2016-11-01: qty 1

## 2016-11-01 MED ORDER — NEOMYCIN-POLYMYXIN B GU 40-200000 IR SOLN
Status: DC | PRN
  Administered 2016-11-01: 16 mL

## 2016-11-01 MED ORDER — OXYCODONE HCL 5 MG PO TABS: 5.0000 mg | ORAL_TABLET | Freq: Once | ORAL | Status: DC | PRN

## 2016-11-01 MED ORDER — LEFLUNOMIDE 10 MG PO TABS
10.0000 mg | ORAL_TABLET | Freq: Every day | ORAL | Status: DC
Start: 2016-11-01 — End: 2016-11-01

## 2016-11-01 MED ORDER — SUCRALFATE 1 G PO TABS: 1.0000 g | ORAL_TABLET | ORAL | Status: DC | PRN

## 2016-11-01 MED ORDER — SODIUM CHLORIDE 0.9 % IJ SOLN
INTRAMUSCULAR | Status: AC
  Filled 2016-11-01: qty 50

## 2016-11-01 MED ORDER — SODIUM CHLORIDE 0.9 % IV SOLN: INTRAVENOUS | Status: DC | PRN

## 2016-11-01 MED ORDER — FENTANYL CITRATE (PF) 100 MCG/2ML IJ SOLN: 25.0000 ug | INTRAMUSCULAR | Status: DC | PRN

## 2016-11-01 MED ORDER — ACETAMINOPHEN 325 MG PO TABS
650.0000 mg | ORAL_TABLET | Freq: Four times a day (QID) | ORAL | Status: DC | PRN
  Administered 2016-11-03 – 2016-11-04 (×2): 650 mg via ORAL
  Filled 2016-11-01 (×2): qty 2

## 2016-11-01 MED ORDER — FLEET ENEMA 7-19 GM/118ML RE ENEM: 1.0000 | ENEMA | Freq: Once | RECTAL | Status: DC | PRN

## 2016-11-01 MED ORDER — PROPOFOL 500 MG/50ML IV EMUL
INTRAVENOUS | Status: DC | PRN
  Administered 2016-11-01: 50 ug/kg/min via INTRAVENOUS
  Administered 2016-11-01: 75 ug/kg/min via INTRAVENOUS

## 2016-11-01 MED ORDER — ACETAMINOPHEN 650 MG RE SUPP: 650.0000 mg | Freq: Four times a day (QID) | RECTAL | Status: DC | PRN

## 2016-11-01 MED ORDER — METOCLOPRAMIDE HCL 10 MG PO TABS: 5.0000 mg | ORAL_TABLET | Freq: Three times a day (TID) | ORAL | Status: DC | PRN

## 2016-11-01 MED ORDER — PROPOFOL 500 MG/50ML IV EMUL
INTRAVENOUS | Status: AC
  Filled 2016-11-01: qty 50

## 2016-11-01 MED ORDER — GLYCOPYRROLATE 0.2 MG/ML IJ SOLN
INTRAMUSCULAR | Status: DC | PRN
  Administered 2016-11-01: 0.2 mg via INTRAVENOUS

## 2016-11-01 MED ORDER — BUPIVACAINE HCL (PF) 0.5 % IJ SOLN
INTRAMUSCULAR | Status: DC | PRN
  Administered 2016-11-01: 3 mL

## 2016-11-01 MED ORDER — ONDANSETRON HCL 4 MG/2ML IJ SOLN: 4.0000 mg | Freq: Four times a day (QID) | INTRAMUSCULAR | Status: DC | PRN

## 2016-11-01 MED ORDER — MIDAZOLAM HCL 5 MG/5ML IJ SOLN
INTRAMUSCULAR | Status: DC | PRN
  Administered 2016-11-01 (×2): 1 mg via INTRAVENOUS

## 2016-11-01 MED ORDER — CLINDAMYCIN PHOSPHATE 600 MG/50ML IV SOLN
600.0000 mg | Freq: Four times a day (QID) | INTRAVENOUS | Status: AC
  Administered 2016-11-01 – 2016-11-02 (×3): 600 mg via INTRAVENOUS
  Filled 2016-11-01 (×3): qty 50

## 2016-11-01 MED ORDER — MIDAZOLAM HCL 2 MG/2ML IJ SOLN
INTRAMUSCULAR | Status: AC
  Filled 2016-11-01: qty 2

## 2016-11-01 MED ORDER — TRIAMCINOLONE ACETONIDE 55 MCG/ACT NA AERO
2.0000 | INHALATION_SPRAY | Freq: Every day | NASAL | Status: DC
  Administered 2016-11-02 – 2016-11-04 (×2): 2 via NASAL
  Filled 2016-11-01: qty 21.6

## 2016-11-01 MED ORDER — LACTATED RINGERS IV SOLN
INTRAVENOUS | Status: DC | PRN
  Administered 2016-11-01: 10:00:00 via INTRAVENOUS

## 2016-11-01 MED ORDER — HYDROMORPHONE HCL 1 MG/ML IJ SOLN: 0.5000 mg | INTRAMUSCULAR | Status: DC | PRN

## 2016-11-01 MED ORDER — BUPIVACAINE-EPINEPHRINE (PF) 0.25% -1:200000 IJ SOLN
INTRAMUSCULAR | Status: AC
  Filled 2016-11-01: qty 30

## 2016-11-01 MED ORDER — MAGNESIUM HYDROXIDE 400 MG/5ML PO SUSP
30.0000 mL | Freq: Every day | ORAL | Status: DC | PRN
  Administered 2016-11-03: 30 mL via ORAL
  Filled 2016-11-01: qty 30

## 2016-11-01 MED ORDER — EPHEDRINE SULFATE 50 MG/ML IJ SOLN
INTRAMUSCULAR | Status: DC | PRN
  Administered 2016-11-01 (×3): 10 mg via INTRAVENOUS

## 2016-11-01 MED ORDER — KETAMINE HCL 50 MG/ML IJ SOLN
INTRAMUSCULAR | Status: AC
  Filled 2016-11-01: qty 10

## 2016-11-01 MED ORDER — HYDROMORPHONE HCL 1 MG/ML IJ SOLN: 0.5000 mg | Freq: Once | INTRAMUSCULAR | Status: DC | PRN

## 2016-11-01 MED ORDER — TRANEXAMIC ACID 1000 MG/10ML IV SOLN
INTRAVENOUS | Status: AC
  Filled 2016-11-01: qty 10

## 2016-11-01 MED ORDER — ACETAMINOPHEN 500 MG PO TABS
1000.0000 mg | ORAL_TABLET | Freq: Four times a day (QID) | ORAL | Status: AC
  Administered 2016-11-01 – 2016-11-02 (×4): 1000 mg via ORAL
  Filled 2016-11-01 (×4): qty 2

## 2016-11-01 MED ORDER — BUPIVACAINE LIPOSOME 1.3 % IJ SUSP
INTRAMUSCULAR | Status: AC
  Filled 2016-11-01: qty 20

## 2016-11-01 MED ORDER — PHENYLEPHRINE HCL 10 MG/ML IJ SOLN
INTRAMUSCULAR | Status: AC
  Filled 2016-11-01: qty 1

## 2016-11-01 MED ORDER — DOCUSATE SODIUM 100 MG PO CAPS
100.0000 mg | ORAL_CAPSULE | Freq: Two times a day (BID) | ORAL | Status: DC
  Administered 2016-11-01 – 2016-11-04 (×6): 100 mg via ORAL
  Filled 2016-11-01 (×6): qty 1

## 2016-11-01 MED ORDER — DEXAMETHASONE SODIUM PHOSPHATE 10 MG/ML IJ SOLN
INTRAMUSCULAR | Status: DC | PRN
  Administered 2016-11-01: 10 mg via INTRAVENOUS

## 2016-11-01 MED ORDER — ONDANSETRON HCL 4 MG PO TABS: 4.0000 mg | ORAL_TABLET | Freq: Four times a day (QID) | ORAL | Status: DC | PRN

## 2016-11-01 MED ORDER — OXYCODONE HCL 5 MG/5ML PO SOLN: 5.0000 mg | Freq: Once | ORAL | Status: DC | PRN

## 2016-11-01 MED ORDER — NEOMYCIN-POLYMYXIN B GU 40-200000 IR SOLN
Status: AC
  Filled 2016-11-01: qty 20

## 2016-11-01 MED ORDER — BUPIVACAINE LIPOSOME 1.3 % IJ SUSP
INTRAMUSCULAR | Status: DC | PRN
  Administered 2016-11-01: 20 mL

## 2016-11-01 MED ORDER — BUPIVACAINE HCL (PF) 0.5 % IJ SOLN
INTRAMUSCULAR | Status: AC
  Filled 2016-11-01: qty 10

## 2016-11-01 MED ORDER — CLINDAMYCIN PHOSPHATE 600 MG/50ML IV SOLN
INTRAVENOUS | Status: DC | PRN
  Administered 2016-11-01: 600 mg via INTRAVENOUS

## 2016-11-01 MED ORDER — MORPHINE SULFATE (PF) 2 MG/ML IV SOLN: 1.0000 mg | Freq: Four times a day (QID) | INTRAVENOUS | Status: DC | PRN

## 2016-11-01 MED ORDER — OXYCODONE HCL 5 MG PO TABS
5.0000 mg | ORAL_TABLET | ORAL | Status: DC | PRN
  Administered 2016-11-01 (×2): 10 mg via ORAL
  Administered 2016-11-01 (×2): 5 mg via ORAL
  Administered 2016-11-02 – 2016-11-03 (×5): 10 mg via ORAL
  Filled 2016-11-01: qty 2
  Filled 2016-11-01: qty 1
  Filled 2016-11-01 (×2): qty 2
  Filled 2016-11-01: qty 1
  Filled 2016-11-01 (×4): qty 2

## 2016-11-01 MED ORDER — BUPIVACAINE-EPINEPHRINE (PF) 0.25% -1:200000 IJ SOLN
INTRAMUSCULAR | Status: DC | PRN
  Administered 2016-11-01: 30 mL

## 2016-11-01 MED ORDER — LEFLUNOMIDE 20 MG PO TABS
10.0000 mg | ORAL_TABLET | Freq: Every day | ORAL | Status: DC
  Administered 2016-11-02 – 2016-11-04 (×3): 10 mg via ORAL
  Filled 2016-11-01 (×3): qty 1

## 2016-11-01 MED ORDER — EPHEDRINE SULFATE 50 MG/ML IJ SOLN
INTRAMUSCULAR | Status: AC
  Filled 2016-11-01: qty 1

## 2016-11-01 SURGICAL SUPPLY — 57 items
BAG DECANTER FOR FLEXI CONT (MISCELLANEOUS) IMPLANT
BLADE SAGITTAL WIDE XTHICK NO (BLADE) ×2 IMPLANT
BLADE SURG SZ20 CARB STEEL (BLADE) ×2 IMPLANT
BNDG COHESIVE 6X5 TAN STRL LF (GAUZE/BANDAGES/DRESSINGS) ×2 IMPLANT
BOWL CEMENT MIXING ADV NOZZLE (MISCELLANEOUS) IMPLANT
CANISTER SUCT 1200ML W/VALVE (MISCELLANEOUS) ×2 IMPLANT
CANISTER SUCT 3000ML PPV (MISCELLANEOUS) ×4 IMPLANT
CAPT HIP HEMI 2 ×2 IMPLANT
CHLORAPREP W/TINT 26ML (MISCELLANEOUS) ×4 IMPLANT
DECANTER SPIKE VIAL GLASS SM (MISCELLANEOUS) ×4 IMPLANT
DRAPE IMP U-DRAPE 54X76 (DRAPES) ×4 IMPLANT
DRAPE INCISE IOBAN 66X60 STRL (DRAPES) ×2 IMPLANT
DRAPE SHEET LG 3/4 BI-LAMINATE (DRAPES) ×2 IMPLANT
DRAPE SURG 17X11 SM STRL (DRAPES) ×2 IMPLANT
DRAPE SURG 17X23 STRL (DRAPES) ×2 IMPLANT
DRSG OPSITE POSTOP 4X12 (GAUZE/BANDAGES/DRESSINGS) ×2 IMPLANT
DRSG OPSITE POSTOP 4X14 (GAUZE/BANDAGES/DRESSINGS) ×2 IMPLANT
DRSG OPSITE POSTOP 4X8 (GAUZE/BANDAGES/DRESSINGS) ×2 IMPLANT
ELECT BLADE 6.5 EXT (BLADE) ×2 IMPLANT
ELECT CAUTERY BLADE 6.4 (BLADE) ×2 IMPLANT
ELECT REM PT RETURN 9FT ADLT (ELECTROSURGICAL) ×2
ELECTRODE REM PT RTRN 9FT ADLT (ELECTROSURGICAL) ×1 IMPLANT
GAUZE PACK 2X3YD (MISCELLANEOUS) IMPLANT
GLOVE BIO SURGEON STRL SZ8 (GLOVE) ×4 IMPLANT
GLOVE INDICATOR 8.0 STRL GRN (GLOVE) ×2 IMPLANT
GOWN STRL REUS W/ TWL LRG LVL3 (GOWN DISPOSABLE) ×1 IMPLANT
GOWN STRL REUS W/ TWL XL LVL3 (GOWN DISPOSABLE) ×1 IMPLANT
GOWN STRL REUS W/TWL LRG LVL3 (GOWN DISPOSABLE) ×1
GOWN STRL REUS W/TWL XL LVL3 (GOWN DISPOSABLE) ×1
HOOD PEEL AWAY FLYTE STAYCOOL (MISCELLANEOUS) ×4 IMPLANT
IV NS 100ML SINGLE PACK (IV SOLUTION) IMPLANT
LABEL OR SOLS (LABEL) ×2 IMPLANT
NDL SAFETY 18GX1.5 (NEEDLE) ×2 IMPLANT
NEEDLE FILTER BLUNT 18X 1/2SAF (NEEDLE) ×1
NEEDLE FILTER BLUNT 18X1 1/2 (NEEDLE) ×1 IMPLANT
NEEDLE SPNL 20GX3.5 QUINCKE YW (NEEDLE) ×2 IMPLANT
NS IRRIG 1000ML POUR BTL (IV SOLUTION) ×2 IMPLANT
PACK HIP PROSTHESIS (MISCELLANEOUS) ×2 IMPLANT
PILLOW ABDUC SM (MISCELLANEOUS) ×2 IMPLANT
PULSAVAC PLUS IRRIG FAN TIP (DISPOSABLE) ×2
SOL .9 NS 3000ML IRR  AL (IV SOLUTION) ×2
SOL .9 NS 3000ML IRR UROMATIC (IV SOLUTION) ×2 IMPLANT
STAPLER SKIN PROX 35W (STAPLE) ×2 IMPLANT
STRAP SAFETY BODY (MISCELLANEOUS) ×2 IMPLANT
SUT ETHIBOND 2 V 37 (SUTURE) ×6 IMPLANT
SUT VIC AB 1 CT1 36 (SUTURE) ×4 IMPLANT
SUT VIC AB 2-0 CT1 (SUTURE) ×6 IMPLANT
SUT VIC AB 2-0 CT1 27 (SUTURE) ×3
SUT VIC AB 2-0 CT1 TAPERPNT 27 (SUTURE) ×3 IMPLANT
SUT VICRYL 1-0 27IN ABS (SUTURE) ×4
SUTURE VICRYL 1-0 27IN ABS (SUTURE) ×2 IMPLANT
SYR 30ML LL (SYRINGE) ×6 IMPLANT
SYR TB 1ML 27GX1/2 LL (SYRINGE) IMPLANT
SYRINGE 10CC LL (SYRINGE) ×2 IMPLANT
TAPE TRANSPORE STRL 2 31045 (GAUZE/BANDAGES/DRESSINGS) ×2 IMPLANT
TIP BRUSH PULSAVAC PLUS 24.33 (MISCELLANEOUS) ×2 IMPLANT
TIP FAN IRRIG PULSAVAC PLUS (DISPOSABLE) ×1 IMPLANT

## 2016-11-01 NOTE — Op Note (Signed)
10/31/2016 - 11/01/2016  11:40 AM  Patient:   Kerri Carter  Pre-Op Diagnosis:   Displaced femoral neck fracture, right hip.  Post-Op Diagnosis:   Same.  Procedure:   Right hip bipolar hemiarthroplasty.  Surgeon:   Pascal Lux, MD  Assistant:   Vance Peper, PA-C  Anesthesia:   Spinal  Findings:   As above.  Complications:   None  EBL:   100 cc  Fluids:   700 cc crystalloid  UOP:   550 cc  TT:   None  Drains:   None  Closure:   Staples  Implants:   Biomet press-fit system with a #13 standard offset Echo femoral stem, a 45 mm outer diameter shell, a 28 mm head, and a -6 mm neck  Brief Clinical Note:   The patient is a 76 year old female who sustained the above-noted injury yesterday afternoon when she apparently lost her balance and fell while getting out of her vehicle. She was brought to the emergency room where x-rays demonstrated the above-noted injury. The patient has been cleared medically and presents at this time for definitive management of the injury.  Procedure:   The patient was brought into the operating room. After adequate spinal anesthesia was obtained, the patient was repositioned in the left lateral decubitus position and secured using a lateral hip positioner. The right hip and lower extremity were prepped with ChloroPrep solution before being draped sterilely. Preoperative antibiotics were administered. A timeout was performed to verify the appropriate surgical site before a standard posterior approach to the hip was made through an approximately 4-5 inch incision. The incision was carried down through the subcutaneous tissues to expose the gluteal fascia and proximal end of the iliotibial band. These structures were split the length of the incision and the Charnley self-retaining hip retractor placed. The bursal tissues were swept posteriorly to expose the short external rotators. The anterior border of the piriformis tendon was identified and this plane  developed down through the capsule to enter the joint. Abundant fracture hematoma was suctioned. A flap of tissue was elevated off the posterior aspect of the femoral neck and greater trochanter and retracted posteriorly. This flap included the piriformis tendon, the short external rotators, and the posterior capsule. The femoral head was removed in its entirety, then taken to the back table where it was measured and found to be optimally replicated by a 45 mm head. The appropriate trial head was inserted and found to demonstrate an excellent suction fit.   Attention was directed to the femoral side. The femoral neck was recut 10-12 mm above the lesser trochanter using an oscillating saw. The piriformis fossa was debrided of soft tissues before the intramedullary canal was accessed through this point using a triple step reamer. The canal was reamed sequentially beginning with a #7 tapered reamer and progressing to a #13 tapered reamer. This provided excellent circumferential chatter. A box osteotome was used to establish version before the canal was broached sequentially beginning with a #7 broach and progressing to a #13 broach. This was left in place and several trial reductions performed. The permanent #13 standard offset femoral stem was impacted into place. A repeat trial reduction was performed using both the -6 mm and -3 mm neck lengths. The -6 mm neck length demonstrated excellent stability both in extension and external rotation as well as with flexion to 90 and internal rotation beyond 70. It also was stable in the position of sleep. The 45 mm outer diameter  shell with the 28 mm head with -6 pillow meter neck construct was put together on the back table before being impacted onto the stem of the femoral component. The Morse taper locking mechanism was verified using manual distraction before the head was relocated and placed through a range of motion with the findings as described above.  The wound  was copiously irrigated with bacitracin saline solution via the jet lavage system before the peri-incisional and pericapsular tissues were injected with 30 cc of 0.5% Sensorcaine with epinephrine and 20 cc of Exparel diluted out to 60 cc with normal saline to help with postoperative analgesia. The posterior flap was reapproximated to the posterior aspect of the greater trochanter using #2 Tycron interrupted sutures placed through drill holes. Several additional #2 Tycron interrupted sutures were used to reinforce this layer of closure. The iliotibial band was reapproximated using #1 Vicryl interrupted sutures before the gluteal fascia was closed using a running #1 Vicryl suture. At this point, 1 g of transexemic acid in 10 cc of normal saline was injected into the joint to help reduce postoperative bleeding. The subcutaneous tissues were closed in several layers using 2-0 Vicryl interrupted sutures before the skin was closed using staples. A sterile occlusive dressing was applied to the wound before the patient was placed into an abduction wedge pillow. The patient was then rolled back into the supine position on the hospital bed before being awakened and returned to the recovery room in satisfactory condition after tolerating the procedure well.

## 2016-11-01 NOTE — NC FL2 (Signed)
[place] LEVEL OF CARE SCREENING TOOL     IDENTIFICATION  Patient Name: Kerri Carter Birthdate: 08/23/1940 Sex: female Admission Date (Current Location): 10/31/2016  [place] and [place] Number:  Engineering geologist and Address:  [facility], [address] [place], [place] [address]      Provider Number: [id number]  Attending Physician Name and Address:  Demetrios Loll, MD  Relative Name and Phone Number:  Laporche, Martelle ([phone])    Current Level of Care: Hospital Recommended Level of Care: [place] Prior Approval Number:    Date Approved/Denied:   PASRR Number: [id number]  Discharge Plan: SNF    Current Diagnoses: Patient Active Problem List   Diagnosis Date Noted  . Hip fracture ([place]) 10/31/2016  . Left foot pain 10/19/2016  . Spinal stenosis of lumbar region 09/15/2016  . Osteoporosis 08/27/2016  . Prediabetes 08/25/2016  . Mixed bipolar I disorder ([place]) 10/09/2015  . CKD (chronic kidney disease), stage III   . Essential hypertension   . Conversion disorder 08/04/2015  . Hyperlipidemia 07/17/2015  . 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 Macular degeneration 07/17/2015  . Insomnia 07/17/2015  . Rheumatoid arthritis ([place]) 12/05/2014  . Valvular heart disease 10/13/2011  . COPD (chronic obstructive pulmonary disease) ([place]) 09/09/2011  . OSA (obstructive sleep apnea) 09/09/2011  . Nocturnal hypoxemia 09/09/2011  . Gastroesophageal reflux disease 02/24/2011  . Single kidney 02/24/2011    Orientation RESPIRATION BLADDER Height & Weight     Self, Time, Situation, Place  Normal Continent Weight: 160 lb (72.6 kg) Height:  5\' 4"  (162.6 cm)  BEHAVIORAL SYMPTOMS/MOOD NEUROLOGICAL BOWEL NUTRITION STATUS      Continent    AMBULATORY STATUS COMMUNICATION OF NEEDS Skin   Extensive Assist Verbally Surgical wounds                       Personal Care Assistance Level of  Assistance  Bathing, Feeding, Dressing Bathing Assistance: Limited assistance Feeding assistance: Independent Dressing Assistance: Limited assistance     Functional Limitations Info             SPECIAL CARE FACTORS FREQUENCY  PT (By licensed PT)     PT Frequency: Up to 5X per day, 5 days per week              Contractures Contractures Info: Not present    Additional Factors Info  Code Status, Allergies Code Status Info: Full Allergies Info: Ceftin Cefuroxime Axetil, Lisinopril, Morphine, Aspirin, Antihistamines, Chlorpheniramine-type, Antivert Meclizine Hcl, Decongestant Pseudoephedrine Hcl, Doxycycline, Morphine And Related, Polymyxin B, Pseudoephedrine Hcl, Sulfa Antibiotics, Xarelto Rivaroxaban, Adhesive Tape, Iodine, Levaquin Levofloxacin In D5w, Tetanus Toxoid Adsorbed, Tetanus Toxoids           Current Medications (11/01/2016):  This is the current hospital active medication list Current Facility-Administered Medications  Medication Dose Route Frequency Provider Last Rate Last Dose  . acetaminophen (TYLENOL) tablet 650 mg  650 mg Oral Q6H PRN Poggi, Marshall Cork, MD       Or  . acetaminophen (TYLENOL) suppository 650 mg  650 mg Rectal Q6H PRN Poggi, Marshall Cork, MD      . acetaminophen (TYLENOL) tablet 1,000 mg  1,000 mg Oral Q6H Poggi, Marshall Cork, MD   1,000 mg at 11/01/16 1256  . albuterol (PROVENTIL) (2.5 MG/3ML) 0.083% nebulizer solution 2.5 mg  2.5 mg Inhalation Q6H PRN Fritzi Mandes, MD      . bisacodyl (DULCOLAX) suppository  10 mg  10 mg Rectal Daily PRN Poggi, Marshall Cork, MD      . calcium-vitamin D (OSCAL WITH D) 500-200 MG-UNIT per tablet 2 tablet  2 tablet Oral Q breakfast Epifanio Lesches, MD      . clindamycin (CLEOCIN) IVPB 600 mg  600 mg Intravenous Q6H Poggi, Marshall Cork, MD 100 mL/hr at 11/01/16 1444 600 mg at 11/01/16 1444  . dextrose 5 % and 0.9 % NaCl with KCl 20 mEq/L infusion   Intravenous Continuous Poggi, Marshall Cork, MD 75 mL/hr at 11/01/16 1444    . diphenhydrAMINE  (BENADRYL) 12.5 MG/5ML elixir 12.5-25 mg  12.5-25 mg Oral Q4H PRN Poggi, Marshall Cork, MD      . docusate sodium (COLACE) capsule 100 mg  100 mg Oral BID Poggi, Marshall Cork, MD      . Derrill Memo ON 11/02/2016] enoxaparin (LOVENOX) injection 40 mg  40 mg Subcutaneous Q24H Poggi, Marshall Cork, MD      . escitalopram (LEXAPRO) tablet 10 mg  10 mg Oral Daily Fritzi Mandes, MD   10 mg at 11/01/16 1443  . folic acid (FOLVITE) tablet 1 mg  1 mg Oral Daily Fritzi Mandes, MD      . furosemide (LASIX) tablet 20 mg  20 mg Oral Daily Fritzi Mandes, MD      . gabapentin (NEURONTIN) capsule 300 mg  300 mg Oral QID Fritzi Mandes, MD   300 mg at 11/01/16 1442  . HYDROmorphone (DILAUDID) injection 0.5-1 mg  0.5-1 mg Intravenous Q2H PRN Poggi, Marshall Cork, MD      . Influenza vac split quadrivalent PF (FLUZONE HIGH-DOSE) injection 0.5 mL  0.5 mL Intramuscular Tomorrow-1000 Epifanio Lesches, MD      . lamoTRIgine (LAMICTAL) tablet 100 mg  100 mg Oral BID Fritzi Mandes, MD   100 mg at 10/31/16 2220  . [START ON 11/02/2016] leflunomide (ARAVA) tablet 10 mg  10 mg Oral Daily Demetrios Loll, MD      . LORazepam (ATIVAN) tablet 0.5 mg  0.5 mg Oral QHS Fritzi Mandes, MD   0.5 mg at 10/31/16 2220  . magnesium hydroxide (MILK OF MAGNESIA) suspension 30 mL  30 mL Oral Daily PRN Poggi, Marshall Cork, MD      . metoCLOPramide (REGLAN) tablet 5-10 mg  5-10 mg Oral Q8H PRN Poggi, Marshall Cork, MD       Or  . metoCLOPramide (REGLAN) injection 5-10 mg  5-10 mg Intravenous Q8H PRN Poggi, Marshall Cork, MD      . montelukast (SINGULAIR) tablet 10 mg  10 mg Oral Daily Fritzi Mandes, MD   10 mg at 11/01/16 1442  . multivitamin-lutein (OCUVITE-LUTEIN) capsule 1 capsule  1 capsule Oral BID Fritzi Mandes, MD   1 capsule at 11/01/16 1443  . nebivolol (BYSTOLIC) tablet 10 mg  10 mg Oral Daily Fritzi Mandes, MD   10 mg at 11/01/16 1443  . ondansetron (ZOFRAN) tablet 4 mg  4 mg Oral Q6H PRN Poggi, Marshall Cork, MD       Or  . ondansetron (ZOFRAN) injection 4 mg  4 mg Intravenous Q6H PRN Poggi, Marshall Cork, MD       . oxyCODONE (Oxy IR/ROXICODONE) immediate release tablet 5-10 mg  5-10 mg Oral Q3H PRN Poggi, Marshall Cork, MD   5 mg at 11/01/16 1442  . pantoprazole (PROTONIX) EC tablet 40 mg  40 mg Oral Daily Fritzi Mandes, MD   40 mg at 11/01/16 1442  . potassium chloride (K-DUR) CR tablet 10 mEq  10 mEq Oral Daily Fritzi Mandes, MD   10 mEq at 11/01/16 1442  . pravastatin (PRAVACHOL) tablet 20 mg  20 mg Oral q1800 Poggi, Marshall Cork, MD      . QUEtiapine (SEROQUEL) tablet 25 mg  25 mg Oral QHS Fritzi Mandes, MD   25 mg at 10/31/16 2220  . sodium phosphate (FLEET) 7-19 GM/118ML enema 1 enema  1 enema Rectal Once PRN Poggi, Marshall Cork, MD      . sucralfate (CARAFATE) tablet 1 g  1 g Oral PRN Poggi, Marshall Cork, MD      . triamcinolone (NASACORT) nasal inhaler 2 spray  2 spray Each Nare Daily Poggi, Marshall Cork, MD         Discharge Medications: Please see discharge summary for a list of discharge medications.  Relevant Imaging Results:  Relevant Lab Results:   Additional Information SS#[id number]  Zettie Pho, LCSW

## 2016-11-01 NOTE — Transfer of Care (Signed)
Immediate Anesthesia Transfer of Care Note  Patient: Kerri Carter  Procedure(s) Performed: Procedure(s): ARTHROPLASTY BIPOLAR HIP (HEMIARTHROPLASTY) (Right)  Patient Location: PACU  Anesthesia Type:Spinal  Level of Consciousness: sedated  Airway & Oxygen Therapy: Patient Spontanous Breathing and Patient connected to face mask oxygen  Post-op Assessment: Report given to RN and Post -op Vital signs reviewed and stable  Post vital signs: Reviewed and stable  Last Vitals:  Vitals:   11/01/16 0118 11/01/16 0732  BP: (!) 122/41 (!) 134/47  Pulse: 77 83  Resp: 18 18  Temp: 37.2 C   SpO2: 91% 93%    Last Pain:  Vitals:   11/01/16 0656  TempSrc:   PainSc: Asleep         Complications: No apparent anesthesia complications

## 2016-11-01 NOTE — Anesthesia Procedure Notes (Signed)
Spinal  Patient location during procedure: OR Start time: 11/01/2016 9:55 AM End time: 11/01/2016 10:10 AM Staffing Anesthesiologist: Katy Fitch K Performed: anesthesiologist and resident/CRNA  Preanesthetic Checklist Completed: patient identified, site marked, surgical consent, pre-op evaluation, timeout performed, IV checked, risks and benefits discussed and monitors and equipment checked Spinal Block Patient position: sitting Prep: Betadine Patient monitoring: heart rate, continuous pulse ox, blood pressure and cardiac monitor Approach: midline Location: L3-4 Injection technique: single-shot Needle Needle type: [place] and Introducer  Needle gauge: 24 G Needle length: 9 cm Assessment Sensory level: T10 Additional Notes Negative paresthesia. Negative blood return. Positive free-flowing CSF. Expiration date of kit checked and confirmed. Patient tolerated procedure well, without complications.  First attempt by Gayla Doss CRNA.  2nd attempt by MDA Piscitello successfjul.  No complications.

## 2016-11-01 NOTE — Progress Notes (Signed)
PT Cancellation Note  Patient Details Name: Kerri Carter MRN: [id number] DOB: Feb 15, 1940   Cancelled Treatment:    Reason Eval/Treat Not Completed: Other (comment).  Pt was attempted x 2 and finally declined therapy today, but originally thought her nurse did not want her to get OOB.  Nursing is supportive of pt being up today.   Ramond Dial 11/01/2016, 3:28 PM   Mee Hives, PT MS Acute Rehab Dept. Number: [place] and [place]

## 2016-11-01 NOTE — Anesthesia Preprocedure Evaluation (Addendum)
Anesthesia Evaluation  Patient identified by MRN, date of birth, ID band Patient awake    Reviewed: Allergy & Precautions, H&P , NPO status , Patient's Chart, lab work & pertinent test results  History of Anesthesia Complications Negative for: history of anesthetic complications  Airway Mallampati: III  TM Distance: <3 FB Neck ROM: limited    Dental  (+) Poor Dentition, Missing, Edentulous Upper, Edentulous Lower   Pulmonary asthma , sleep apnea, Continuous Positive Airway Pressure Ventilation and Oxygen sleep apnea , COPD, former smoker,           Cardiovascular Exercise Tolerance: Good hypertension, (-) angina+ CAD and + Peripheral Vascular Disease  (-) Past MI      Neuro/Psych PSYCHIATRIC DISORDERS Anxiety Depression Bipolar Disorder negative neurological ROS     GI/Hepatic Neg liver ROS, PUD, GERD  Medicated and Controlled,  Endo/Other  negative endocrine ROS  Renal/GU Renal disease     Musculoskeletal  (+) Arthritis ,   Abdominal   Peds  Hematology negative hematology ROS (+)   Anesthesia Other Findings Past Medical History: No date: Anxiety No date: Asthma No date: Chicken pox No date: CKD (chronic kidney disease), stage III     Comment:  a. s/p R nephrectomy. No date: Conversion disorder No date: COPD (chronic obstructive pulmonary disease) (HCC) No date: Depression No date: Essential hypertension No date: GERD (gastroesophageal reflux disease) No date: Hyperlipidemia No date: Inflammatory arthritis     Comment:  a. hands/carpal tunnel.  b. Low titer rheumatoid factor.              c. Negative anti-CCP antibodies. d. Plaquenil. No date: Non-Obstructive CAD     Comment:  a. 07/2009 Cath ([facility]): nonobs dzs;  b. 03/2011 Cath               [facility]): nonobs dzs. No date: Osteoarthritis     Comment:  a. Knees. No date: PUD (peptic ulcer disease) No date: Toxic maculopathy No date: Valvular heart disease     Comment:  a. 07/2015 Echo: EF 55-60%, Mild AI, AS, MR, and TR.  Past Surgical History: No date: APPENDECTOMY No date: BUNIONECTOMY No date: CESAREAN SECTION     Comment:  x1 05/11/2016: CHOLECYSTECTOMY; N/A     Comment:  Procedure: LAPAROSCOPIC CHOLECYSTECTOMY;  Surgeon:               Florene Glen, MD;  Location: [facility] ORS;  Service:               General;  Laterality: N/A; 04/02/2016: COLONOSCOPY WITH PROPOFOL; N/A     Comment:  Procedure: COLONOSCOPY WITH PROPOFOL;  Surgeon: Jonathon Bellows, MD;  Location: [facility] ENDOSCOPY;  Service: Endoscopy;              Laterality: N/A; 05/08/2016: ENDOSCOPIC RETROGRADE CHOLANGIOPANCREATOGRAPHY (ERCP) WITH  PROPOFOL; N/A     Comment:  Procedure: ENDOSCOPIC RETROGRADE               CHOLANGIOPANCREATOGRAPHY (ERCP) WITH PROPOFOL;  Surgeon:               Lucilla Lame, MD;  Location: [facility] ENDOSCOPY;  Service:               Endoscopy;  Laterality: N/A; 04/02/2016: ESOPHAGEAL DILATION     Comment:  Procedure: ESOPHAGEAL DILATION;  Surgeon: Jonathon Bellows,  MD;  Location: [facility] ENDOSCOPY;  Service: Endoscopy;; 04/02/2016: ESOPHAGOGASTRODUODENOSCOPY (EGD) WITH PROPOFOL; N/A     Comment:  Procedure: ESOPHAGOGASTRODUODENOSCOPY (EGD) WITH               PROPOFOL;  Surgeon: Jonathon Bellows, MD;  Location: [facility]               ENDOSCOPY;  Service: Endoscopy;  Laterality: N/A; 1988: NEPHRECTOMY     Comment:  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 No date: TONSILLECTOMY 12/10/11: TOTAL HIP ARTHROPLASTY     Comment:  [facility] left hip No date: TUBAL LIGATION  BMI    Body Mass Index:  27.46 kg/m      Reproductive/Obstetrics negative OB ROS                             Anesthesia Physical Anesthesia Plan  ASA: III  Anesthesia Plan: Spinal   Post-op Pain Management:    Induction:   PONV Risk Score and Plan: Propofol infusion  Airway Management Planned: Natural Airway and Nasal  Cannula  Additional Equipment:   Intra-op Plan:   Post-operative Plan:   Informed Consent: I have reviewed the patients History and Physical, chart, labs and discussed the procedure including the risks, benefits and alternatives for the proposed anesthesia with the patient or authorized representative who has indicated his/her understanding and acceptance.   Dental Advisory Given  Plan Discussed with: Anesthesiologist, CRNA and Surgeon  Anesthesia Plan Comments: (Patient has lumbar hardware, consented for small risk of infecting hardware with spinal, patient voiced understanding.  Patient reports no bleeding problems and no anticoagulant use.  Plan for spinal with backup GA  Patient consented for risks of anesthesia including but not limited to:  - adverse reactions to medications - risk of bleeding, infection, nerve damage and headache - risk of failed spinal - damage to teeth, lips or other oral mucosa - sore throat or hoarseness - Damage to heart, brain, lungs or loss of life  Patient voiced understanding.)       Anesthesia Quick Evaluation

## 2016-11-01 NOTE — Anesthesia Post-op Follow-up Note (Signed)
Anesthesia QCDR form completed.        

## 2016-11-01 NOTE — Progress Notes (Signed)
[place] [facility] [place] NAME: Kerri Carter    MR#:  [id number]  DATE OF BIRTH:  03-07-1940  SUBJECTIVE:  CHIEF COMPLAINT:   Chief Complaint  Patient presents with  . Hip Pain   Right hip pain. REVIEW OF SYSTEMS:  Review of Systems  Constitutional: Negative for chills, fever and malaise/fatigue.  HENT: Negative for sore throat.   Eyes: Negative for blurred vision and double vision.  Respiratory: Negative for cough, hemoptysis, shortness of breath, wheezing and stridor.   Cardiovascular: Negative for chest pain, palpitations, orthopnea and leg swelling.  Gastrointestinal: Negative for abdominal pain, blood in stool, diarrhea, melena, nausea and vomiting.  Genitourinary: Negative for dysuria, flank pain and hematuria.  Musculoskeletal: Positive for joint pain. Negative for back pain.  Skin: Negative for rash.  Neurological: Negative for dizziness, sensory change, focal weakness, seizures, loss of consciousness, weakness and headaches.  Endo/Heme/Allergies: Negative for polydipsia.  Psychiatric/Behavioral: Negative for depression. The patient is not nervous/anxious.     DRUG ALLERGIES:   Allergies  Allergen Reactions  . Ceftin [Cefuroxime Axetil] Anaphylaxis    Other reaction(s): Other (See Comments) REACTION: tongue and throat swell Other Reaction: TONGUE AND THROAT SWELLING REACTION: tongue and throat swell  . Lisinopril Anaphylaxis    REACTION: tongue and throat swelling (onset 10-10-09)  . Morphine Other (See Comments)    Per patient, low blood pressure issues, following morphine, that requires action to raise it back up. Drop in BP - can take small infrequent doses  . Aspirin Other (See Comments)    Sulfasalazine allergy cross reacts  . Antihistamines, Chlorpheniramine-Type     REACTION: makes pt hyper  . Antivert [Meclizine Hcl]     Other reaction(s): Unknown REACTION: bladder will not empty REACTION: bladder will not empty    . Decongestant [Pseudoephedrine Hcl]     Other reaction(s): Unknown REACTION: makes pt hyper REACTION: makes pt hyper  . Doxycycline     REACTION: GI upset  . Morphine And Related Other (See Comments)    Per patient, low blood pressure issues, following morphine, that requires action to raise it back up.  . Polymyxin B     Medication was in eye drops.  . Pseudoephedrine Hcl Other (See Comments)    REACTION: makes pt hyper  . Sulfa Antibiotics     Face swelling  . Xarelto [Rivaroxaban]     Other reaction(s): Other (See Comments) GI bleed Stomach burning, bleeding, and tar in stool GI bleed  . Adhesive [Tape] Rash  . Iodine Hives and Rash  . Levaquin [Levofloxacin In D5w] Rash  . Tetanus Toxoid Adsorbed Other (See Comments)    REACTION: rash, fever, hot to touch at injection site  . Tetanus Toxoids     REACTION: rash, fever, hot to touch at injection site   VITALS:  Blood pressure (!) 109/47, pulse 86, temperature 97.7 F (36.5 C), resp. rate 11, height 5\' 4"  (1.626 m), weight 160 lb (72.6 kg), SpO2 94 %. PHYSICAL EXAMINATION:  Physical Exam  Constitutional: She is oriented to person, place, and time and well-developed, well-nourished, and in no distress.  HENT:  Head: Normocephalic.  Mouth/Throat: Oropharynx is clear and moist.  Eyes: Pupils are equal, round, and reactive to light. Conjunctivae and EOM are normal. No scleral icterus.  Neck: Normal range of motion. Neck supple. No JVD present. No tracheal deviation present.  Cardiovascular: Normal rate, regular rhythm and normal heart sounds.  Exam reveals no gallop.  No murmur heard. Pulmonary/Chest: Effort normal and breath sounds normal. No respiratory distress. She has no wheezes. She has no rales.  Abdominal: Soft. Bowel sounds are normal. She exhibits no distension. There is no tenderness. There is no rebound.  Musculoskeletal: She exhibits deformity. She exhibits no edema or tenderness.  Right leg deformity   Neurological: She is alert and oriented to person, place, and time. No cranial nerve deficit.  Skin: No rash noted. No erythema.  Psychiatric: Affect normal.   LABORATORY PANEL:  Female CBC  Recent Labs Lab 10/31/16 1828  WBC 9.9  HGB 11.4*  HCT 34.4*  PLT 191   ------------------------------------------------------------------------------------------------------------------ Chemistries   Recent Labs Lab 10/31/16 1828  NA 137  K 4.1  CL 101  CO2 26  GLUCOSE 129*  BUN 14  CREATININE 1.40*  CALCIUM 9.1   RADIOLOGY:  Dg Chest 1 View  Result Date: 10/31/2016 CLINICAL DATA:  Right hip pain after fall. EXAM: CHEST 1 VIEW COMPARISON:  None. FINDINGS: The heart size and mediastinal contours are within normal limits. Both lungs are clear. No pneumothorax or pleural effusion is noted. The visualized skeletal structures are unremarkable. IMPRESSION: No acute cardiopulmonary abnormality seen. Electronically Signed   By: Marijo Conception, M.D.   On: 10/31/2016 18:51   Ct Head Wo Contrast  Result Date: 10/31/2016 CLINICAL DATA:  Fall at home today. Head injury. Right hip fracture. EXAM: CT HEAD WITHOUT CONTRAST TECHNIQUE: Contiguous axial images were obtained from the base of the skull through the vertex without intravenous contrast. COMPARISON:  07/13/2015 brain MRI. FINDINGS: Brain: No evidence of parenchymal hemorrhage or extra-axial fluid collection. No mass lesion, mass effect, or midline shift. No CT evidence of acute infarction. Nonspecific mild subcortical and periventricular white matter hypodensity, most in keeping with chronic small vessel ischemic change. Generalized cerebral volume loss. Cerebral ventricle sizes are stable and concordant with the degree of cerebral volume loss. Vascular: No acute abnormality. Skull: No evidence of calvarial fracture. Sinuses/Orbits: Mild mucoperiosteal thickening throughout the paranasal sinuses without fluid levels. Other: Small right posterior  parietal scalp contusion. The mastoid air cells are unopacified. IMPRESSION: 1. Small right posterior parietal scalp contusion. 2. No evidence of acute intracranial abnormality. No evidence of calvarial fracture. 3. Generalized cerebral volume loss and mild chronic small vessel ischemic changes. Electronically Signed   By: Ilona Sorrel M.D.   On: 10/31/2016 19:26   Dg Hip Unilat W Or Wo Pelvis 2-3 Views Right  Result Date: 10/31/2016 CLINICAL DATA:  Fall.  Right hip pain. EXAM: DG HIP (WITH OR WITHOUT PELVIS) 2-3V RIGHT COMPARISON:  06/12/2016 CT abdomen/pelvis. FINDINGS: Partially visualized left total hip arthroplasty, with no evidence of hardware fracture or loosening. There is an acute impacted subcapital right femoral neck fracture, nondisplaced. No right hip dislocation. No suspicious focal osseous lesions. No additional osseous fractures. No pelvic diastasis. Bilateral posterior lower lumbar fusion hardware. IMPRESSION: Acute nondisplaced impacted subcapital right femoral neck fracture. Electronically Signed   By: Ilona Sorrel M.D.   On: 10/31/2016 18:52   ASSESSMENT AND PLAN:   Amanee Iacovelli  is a 76 y.o. female with a known history of inflammatory arthritis on Arava and Forteo,COPD, hypertension, hyperlipidemia comes in the emergency room after she sustained a mechanical fall while trying to get out of the car this afternoon. Patient complains of right hip pain and was found to have acute nondisplaced right femoral subcapital fracture Patient is being admitted for further eval and management.  1. Acute nondisplacedright femoral neck  fracture status post mechanical fall Surgery by Dr. Roland Rack today. Pain control. PT after surgery.  2. History of mild AI and MR -Follows with [facility] cardiology -Echo shows EF of 60% in 2017 -Patient has no history of coronary artery disease had cardiac catheterization in the past -Resumed cardiac meds  3. Hypertension -Continue bystolic  4.  Hyperlipidemia -Continue Mevacor  5. DVT prophylaxis on SCD and teds -DVT prophylaxis will be ordered by orthopedic surgeon after surgery   All the records are reviewed and case discussed with Care Management/Social Worker. Management plans discussed with the patient, family and they are in agreement.  CODE STATUS: Full Code  TOTAL TIME TAKING CARE OF THIS PATIENT: 33 minutes.   More than 50% of the time was spent in counseling/coordination of care: YES  POSSIBLE D/C IN 2-3 DAYS, DEPENDING ON CLINICAL CONDITION.   Demetrios Loll M.D on 11/01/2016 at 12:58 PM  Between 7am to 6pm - Pager - [phone]  After 6pm go to [email or web address] - [facility]

## 2016-11-01 NOTE — Clinical Social Work Note (Signed)
CSW received consult for SNF placement. CSW will assess pending PT recommendation.  Santiago Bumpers, MSW, Latanya Presser [phone]

## 2016-11-01 NOTE — Anesthesia Procedure Notes (Signed)
Date/Time: 11/01/2016 10:27 AM Performed by: Nelda Marseille Pre-anesthesia Checklist: Patient identified, Emergency Drugs available, Suction available, Patient being monitored and Timeout performed Oxygen Delivery Method: Simple face mask

## 2016-11-01 NOTE — Consult Note (Signed)
ORTHOPAEDIC CONSULTATION  REQUESTING PHYSICIAN: Demetrios Loll, MD  Chief Complaint:   Right hip pain.  History of Present Illness: Kerri Carter is a 76 y.o. female with multiple medical problems including valvular heart disease, coronary artery disease, COPD, hypertension, gastroesophageal reflux disease, 1 kidney, and anxiety/depression. The patient was in her usual state of health until yesterday when she apparently lost her balance and fell while getting out of her vehicle, landing on her right hip. She was brought to the emergency room where x-rays demonstrated a displaced right femoral neck fracture. The patient denies any associated injuries as a result of the fall. She did not strike her head or lose consciousness. The patient also denies any lightheadedness, chest pain, shortness of breath, or other symptoms may have precipitated her fall.  Past Medical History:  Diagnosis Date  . Anxiety   . Asthma   . Chicken pox   . CKD (chronic kidney disease), stage III    a. s/p R nephrectomy.  . Conversion disorder   . COPD (chronic obstructive pulmonary disease) ([place])   . Depression   . Essential hypertension   . GERD (gastroesophageal reflux disease)   . Hyperlipidemia   . Inflammatory arthritis    a. hands/carpal tunnel.  b. Low titer rheumatoid factor. c. Negative anti-CCP antibodies. d. Plaquenil.  . Non-Obstructive CAD    a. 07/2009 Cath ([facility]): nonobs dzs;  b. 03/2011 Cath [facility]): nonobs dzs.  . Osteoarthritis    a. Knees.  . PUD (peptic ulcer disease)   . Toxic maculopathy   . Valvular heart disease    a. 07/2015 Echo: EF 55-60%, Mild AI, AS, MR, and TR.   Past Surgical History:  Procedure Laterality Date  . APPENDECTOMY    . BUNIONECTOMY    . CESAREAN SECTION     x1  . CHOLECYSTECTOMY N/A 05/11/2016   Procedure: LAPAROSCOPIC CHOLECYSTECTOMY;  Surgeon: Florene Glen, MD;  Location: [facility] ORS;  Service: General;   Laterality: N/A;  . COLONOSCOPY WITH PROPOFOL N/A 04/02/2016   Procedure: COLONOSCOPY WITH PROPOFOL;  Surgeon: Jonathon Bellows, MD;  Location: [facility] ENDOSCOPY;  Service: Endoscopy;  Laterality: N/A;  . ENDOSCOPIC RETROGRADE CHOLANGIOPANCREATOGRAPHY (ERCP) WITH PROPOFOL N/A 05/08/2016   Procedure: ENDOSCOPIC RETROGRADE CHOLANGIOPANCREATOGRAPHY (ERCP) WITH PROPOFOL;  Surgeon: Lucilla Lame, MD;  Location: [facility] ENDOSCOPY;  Service: Endoscopy;  Laterality: N/A;  . ESOPHAGEAL DILATION  04/02/2016   Procedure: ESOPHAGEAL DILATION;  Surgeon: Jonathon Bellows, MD;  Location: [facility] ENDOSCOPY;  Service: Endoscopy;;  . ESOPHAGOGASTRODUODENOSCOPY (EGD) WITH PROPOFOL N/A 04/02/2016   Procedure: ESOPHAGOGASTRODUODENOSCOPY (EGD) WITH PROPOFOL;  Surgeon: Jonathon Bellows, MD;  Location: [facility] ENDOSCOPY;  Service: Endoscopy;  Laterality: N/A;  . 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 TONSILLECTOMY    . TOTAL HIP ARTHROPLASTY  12/10/11   [facility] left hip  . TUBAL LIGATION     Social History   Social History  . Marital status: Married    Spouse name: N/A  . Number of children: 2  . Years of education: Some Coll   Occupational History  . Retired    Social History Main Topics  . Smoking status: Former Smoker    Packs/day: 0.50    Years: 20.00    Types: Cigarettes    Quit date: 02/02/1974  . Smokeless tobacco: Never Used  . Alcohol use No  . Drug use: No  . Sexual activity: Not Currently   Other Topics Concern  . None  Social History Narrative   Lives at home in [place] with her husband and grandson.   Right-handed.   6 cups coffee per day.   Family History  Problem Relation Age of Onset  . Rheum arthritis Mother   . Asthma Mother   . Parkinson's disease Mother   . Heart disease Mother   . Stroke Mother   . Hypertension Mother   . Heart attack Father   . Heart disease Father   . Hypertension Father   . Diabetes Son   . Asthma Sister   . Heart disease Sister   .  Lung cancer Sister   . Heart disease Sister   . Heart disease Sister   . Breast cancer Sister   . Heart attack Sister   . Heart disease Brother   . Heart disease Maternal Grandmother   . Diabetes Maternal Grandmother   . Colon cancer Maternal Grandmother   . Heart disease Brother   . Alcohol abuse Brother   . Depression Brother    Allergies  Allergen Reactions  . Ceftin [Cefuroxime Axetil] Anaphylaxis    Other reaction(s): Other (See Comments) REACTION: tongue and throat swell Other Reaction: TONGUE AND THROAT SWELLING REACTION: tongue and throat swell  . Lisinopril Anaphylaxis    REACTION: tongue and throat swelling (onset 10-10-09)  . Morphine Other (See Comments)    Per patient, low blood pressure issues, following morphine, that requires action to raise it back up. Drop in BP - can take small infrequent doses  . Aspirin Other (See Comments)    Sulfasalazine allergy cross reacts  . Antihistamines, Chlorpheniramine-Type     REACTION: makes pt hyper  . Antivert [Meclizine Hcl]     Other reaction(s): Unknown REACTION: bladder will not empty REACTION: bladder will not empty  . Decongestant [Pseudoephedrine Hcl]     Other reaction(s): Unknown REACTION: makes pt hyper REACTION: makes pt hyper  . Doxycycline     REACTION: GI upset  . Morphine And Related Other (See Comments)    Per patient, low blood pressure issues, following morphine, that requires action to raise it back up.  . Polymyxin B     Medication was in eye drops.  . Pseudoephedrine Hcl Other (See Comments)    REACTION: makes pt hyper  . Sulfa Antibiotics     Face swelling  . Xarelto [Rivaroxaban]     Other reaction(s): Other (See Comments) GI bleed Stomach burning, bleeding, and tar in stool GI bleed  . Adhesive [Tape] Rash  . Iodine Hives and Rash  . Levaquin [Levofloxacin In D5w] Rash  . Tetanus Toxoid Adsorbed Other (See Comments)    REACTION: rash, fever, hot to touch at injection site  . Tetanus  Toxoids     REACTION: rash, fever, hot to touch at injection site   Prior to Admission medications   Medication Sig Start Date End Date Taking? Authorizing Provider  BYSTOLIC 10 MG tablet take 1 tablet by mouth once daily 07/28/16  Yes Juanito Doom, MD  Calcium-Vitamin D-Vitamin K (CALCIUM SOFT CHEWS) [phone] MG-UNT-MCG CHEW Chew by mouth.   Yes [provider]  escitalopram (LEXAPRO) 10 MG tablet Take 1 tablet (10 mg total) by mouth daily. 07/29/16  Yes Rainey Pines, MD  folic acid (FOLVITE) 1 MG tablet Take 1 mg by mouth daily.   Yes [provider]  furosemide (LASIX) 20 MG tablet take 1 tablet by mouth once daily 09/09/15  Yes Cook, Jayce G, DO  gabapentin (NEURONTIN) 300 MG  capsule TAKE ONE CAPSULE BY MOUTH FOUR TIMES DAILY Patient taking differently: TAKE two CAPSULE BY MOUTH two TIMES DAILY 10/30/16  Yes Cook, Jayce G, DO  lamoTRIgine (LAMICTAL) 100 MG tablet Take 1 tablet (100 mg total) by mouth 2 (two) times daily. 07/29/16  Yes Rainey Pines, MD  leflunomide (ARAVA) 10 MG tablet  08/12/16  Yes [provider]  LORazepam (ATIVAN) 0.5 MG tablet Take 1 tablet (0.5 mg total) by mouth at bedtime. 07/29/16  Yes Rainey Pines, MD  lovastatin (MEVACOR) 20 MG tablet Take 1 tablet (20 mg total) by mouth at bedtime. 10/22/15  Yes Cook, Jayce G, DO  montelukast (SINGULAIR) 10 MG tablet take 1 tablet by mouth once daily 05/11/16  Yes Cook, Jayce G, DO  multivitamin-lutein (OCUVITE-LUTEIN) CAPS capsule Take 1 capsule by mouth 2 (two) times daily.   Yes [provider]  pantoprazole (PROTONIX) 40 MG tablet take 1 tablet by mouth twice a day as directed 04/14/16  Yes Cook, Jayce G, DO  potassium chloride (K-DUR) 10 MEQ tablet take 1 tablet by mouth once daily 09/09/15  Yes Cook, Jayce G, DO  QUEtiapine (SEROQUEL) 25 MG tablet Take 1 tablet (25 mg total) by mouth at bedtime. 07/29/16  Yes Rainey Pines, MD  ranitidine (ZANTAC) 150 MG capsule take 1 capsule by mouth twice a  day 07/31/16  Yes Crecencio Mc, MD  Teriparatide, Recombinant, (FORTEO) 600 MCG/2.4ML SOLN Inject 0.8 mLs into the skin daily.   Yes [provider]  traMADol (ULTRAM) 50 MG tablet take 1 tablet by mouth every 6 hours if needed for pain 07/27/16  Yes [provider]  albuterol (VENTOLIN HFA) 108 (90 Base) MCG/ACT inhaler Inhale 2 puffs into the lungs every 6 (six) hours as needed. 11/25/15   Juanito Doom, MD  Calcium Carb-Cholecalciferol (CALTRATE 600+D3 SOFT) 600-800 MG-UNIT CHEW Chew 2 tablets by mouth daily.    [provider]  oxyCODONE (OXY IR/ROXICODONE) 5 MG immediate release tablet Take 5 mg by mouth every 6 (six) hours as needed.     [provider]  RA NASAL ALLERGY 55 MCG/ACT AERO nasal inhaler instill 2 sprays into each nostril once daily 01/22/16   [provider]  sucralfate (CARAFATE) 1 g tablet Take 1 tablet (1 g total) by mouth 4 (four) times daily -  with meals and at bedtime. Patient taking differently: Take 1 g by mouth as needed.  10/13/16 11/12/16  Jonathon Bellows, MD   Dg Chest 1 View  Result Date: 10/31/2016 CLINICAL DATA:  Right hip pain after fall. EXAM: CHEST 1 VIEW COMPARISON:  None. FINDINGS: The heart size and mediastinal contours are within normal limits. Both lungs are clear. No pneumothorax or pleural effusion is noted. The visualized skeletal structures are unremarkable. IMPRESSION: No acute cardiopulmonary abnormality seen. Electronically Signed   By: Marijo Conception, M.D.   On: 10/31/2016 18:51   Ct Head Wo Contrast  Result Date: 10/31/2016 CLINICAL DATA:  Fall at home today. Head injury. Right hip fracture. EXAM: CT HEAD WITHOUT CONTRAST TECHNIQUE: Contiguous axial images were obtained from the base of the skull through the vertex without intravenous contrast. COMPARISON:  07/13/2015 brain MRI. FINDINGS: Brain: No evidence of parenchymal hemorrhage or extra-axial fluid collection. No mass lesion, mass effect, or  midline shift. No CT evidence of acute infarction. Nonspecific mild subcortical and periventricular white matter hypodensity, most in keeping with chronic small vessel ischemic change. Generalized cerebral volume loss. Cerebral ventricle sizes are stable and concordant  with the degree of cerebral volume loss. Vascular: No acute abnormality. Skull: No evidence of calvarial fracture. Sinuses/Orbits: Mild mucoperiosteal thickening throughout the paranasal sinuses without fluid levels. Other: Small right posterior parietal scalp contusion. The mastoid air cells are unopacified. IMPRESSION: 1. Small right posterior parietal scalp contusion. 2. No evidence of acute intracranial abnormality. No evidence of calvarial fracture. 3. Generalized cerebral volume loss and mild chronic small vessel ischemic changes. Electronically Signed   By: Ilona Sorrel M.D.   On: 10/31/2016 19:26   Dg Hip Unilat W Or Wo Pelvis 2-3 Views Right  Result Date: 10/31/2016 CLINICAL DATA:  Fall.  Right hip pain. EXAM: DG HIP (WITH OR WITHOUT PELVIS) 2-3V RIGHT COMPARISON:  06/12/2016 CT abdomen/pelvis. FINDINGS: Partially visualized left total hip arthroplasty, with no evidence of hardware fracture or loosening. There is an acute impacted subcapital right femoral neck fracture, nondisplaced. No right hip dislocation. No suspicious focal osseous lesions. No additional osseous fractures. No pelvic diastasis. Bilateral posterior lower lumbar fusion hardware. IMPRESSION: Acute nondisplaced impacted subcapital right femoral neck fracture. Electronically Signed   By: Ilona Sorrel M.D.   On: 10/31/2016 18:52    Positive ROS: All other systems have been reviewed and were otherwise negative with the exception of those mentioned in the HPI and as above.  Physical Exam: General:  Alert, no acute distress Psychiatric:  Patient is competent for consent with normal mood and affect   Cardiovascular:  No pedal edema Respiratory:  No wheezing,  non-labored breathing GI:  Abdomen is soft and non-tender Skin:  No lesions in the area of chief complaint Neurologic:  Sensation intact distally Lymphatic:  No axillary or cervical lymphadenopathy  Orthopedic Exam:  Orthopedic examination is limited to the right hip and lower extremity. The right lower extremity somewhat shortened and externally rotated as compared to the left. Skin inspection around the right hip is unremarkable. There is no evidence for rashes, erythema, ecchymosis, abrasions, or other abnormalities. She has mild tenderness to palpation over the lateral aspect of the right hip. She has moderate to severe pain with any attempted active or passive motion of the right hip. She is able to actively dorsiflex and plantarflex her toes and ankle. Sensations intact light touch to all digits patient's. She has good capillary refill to her right foot.  X-rays:  X-rays of the pelvis and right hip are available for review. These films demonstrate a displaced femoral neck fracture of the right hip. No lytic lesions or significant degenerative changes are noted.  Assessment: Displaced right femoral neck fracture.  Plan: The treatment options are discussed with the patient, including both the surgical and nonsurgical choices. The patient would like to proceed with surgical intervention to include a right hip hemiarthroplasty. This procedure has been discussed in detail with the patient, as have the potential risks (including bleeding, infection, nerve and/or blood vessel injury, persistent or recurrent pain, stiffness, loosening of and/or failure of the components, dislocation, leg length inequality, need for further surgery, blood clots, strokes, heart attacks and/or arrhythmias, etc.) and benefits. The patient states her understanding and wishes to proceed. A formal written consent has been signed.  Thank you for ask me to participate in the care of this most pleasant woman. I will be happy  to follow her with you.   Pascal Lux, MD  Beeper #:  [phone]  11/01/2016 9:04 AM

## 2016-11-02 ENCOUNTER — Ambulatory Visit: Payer: Medicare Other | Admitting: Psychiatry

## 2016-11-02 ENCOUNTER — Encounter: Payer: Self-pay | Admitting: Surgery

## 2016-11-02 LAB — CBC WITH DIFFERENTIAL/PLATELET
Basophils Absolute: 0.1 10*3/uL (ref 0–0.1)
Basophils Relative: 1 %
Eosinophils Absolute: 0 10*3/uL (ref 0–0.7)
Eosinophils Relative: 0 %
HCT: 31.1 % — ABNORMAL LOW (ref 35.0–47.0)
Hemoglobin: 10.4 g/dL — ABNORMAL LOW (ref 12.0–16.0)
Lymphocytes Relative: 7 %
Lymphs Abs: 0.9 10*3/uL — ABNORMAL LOW (ref 1.0–3.6)
MCH: 28 pg (ref 26.0–34.0)
MCHC: 33.6 g/dL (ref 32.0–36.0)
MCV: 83.6 fL (ref 80.0–100.0)
Monocytes Absolute: 0.8 10*3/uL (ref 0.2–0.9)
Monocytes Relative: 7 %
Neutro Abs: 10.3 10*3/uL — ABNORMAL HIGH (ref 1.4–6.5)
Neutrophils Relative %: 85 %
Platelets: 175 10*3/uL (ref 150–440)
RBC: 3.72 MIL/uL — ABNORMAL LOW (ref 3.80–5.20)
RDW: 16.2 % — ABNORMAL HIGH (ref 11.5–14.5)
WBC: 12.1 10*3/uL — ABNORMAL HIGH (ref 3.6–11.0)

## 2016-11-02 LAB — BASIC METABOLIC PANEL
Anion gap: 6 (ref 5–15)
BUN: 16 mg/dL (ref 6–20)
CO2: 27 mmol/L (ref 22–32)
Calcium: 8.2 mg/dL — ABNORMAL LOW (ref 8.9–10.3)
Chloride: 107 mmol/L (ref 101–111)
Creatinine, Ser: 1.27 mg/dL — ABNORMAL HIGH (ref 0.44–1.00)
GFR calc Af Amer: 47 mL/min — ABNORMAL LOW (ref >60)
GFR calc non Af Amer: 41 mL/min — ABNORMAL LOW (ref >60)
Glucose, Bld: 171 mg/dL — ABNORMAL HIGH (ref 65–99)
Potassium: 4.9 mmol/L (ref 3.5–5.1)
Sodium: 140 mmol/L (ref 135–145)

## 2016-11-02 NOTE — Clinical Social Work Note (Signed)
Clinical Social Work Assessment  Patient Details  Name: Kerri Carter MRN: [id number] Date of Birth: 1940/04/29  Date of referral:  11/02/16               Reason for consult:  Facility Placement                Permission sought to share information with:  Chartered certified accountant granted to share information::  Yes, Verbal Permission Granted  Name::      [place]::   [place]   Relationship::     Contact Information:     Housing/Transportation Living arrangements for the past 2 months:  [place] of Information:  Patient, Spouse Patient Interpreter Needed:  None Criminal Activity/Legal Involvement Pertinent to Current Situation/Hospitalization:  No - Comment as needed Significant Relationships:  Spouse Lives with:  Spouse Do you feel safe going back to the place where you live?  Yes Need for family participation in patient care:  Yes (Comment)  Care giving concerns:  Patient lives in [place] with her husband [name] and 2 grandchildren that are in their 75s.    Social Worker assessment / plan:  Holiday representative (CSW) received SNF consult. PT is recommending SNF. CSW met with patient and her husband [name] was at bedside. Patient was alert and oriented X4 and was sitting up in the chair at bedside. CSW introduced self and explained role of CSW department. Patient reported that she lives in [place] with her husband and is familiar with the SNF process. Patient reported that she has been to [facility] 4 times. Patient is agreeable to SNF search in [facility] and prefers [facility]. FL2 complete and faxed out.   CSW presented bed offers and patient chose [facility]. [facility] admissions coordinator at [facility] is aware of accepted bed offer. CSW will continue to follow and assist as needed.    Employment status:  Retired Nurse, adult PT  Recommendations:  [place] / Referral to community resources:  [place]  Patient/Family's Response to care:  Patient and husband are agreeable for patient to go to [facility].   Patient/Family's Understanding of and Emotional Response to Diagnosis, Current Treatment, and Prognosis:  Patient was very pleasant and thanked CSW for assistance.   Emotional Assessment Appearance:  Appears stated age Attitude/Demeanor/Rapport:    Affect (typically observed):  Accepting, Adaptable, Pleasant Orientation:  Oriented to Self, Oriented to Place, Oriented to  Time, Oriented to Situation Alcohol / Substance use:  Not Applicable Psych involvement (Current and /or in the community):  No (Comment)  Discharge Needs  Concerns to be addressed:  Discharge Planning Concerns Readmission within the last 30 days:  No Current discharge risk:  Dependent with Mobility Barriers to Discharge:  Continued Medical Work up   [employer], Veronia Beets, LCSW 11/02/2016, 2:57 PM

## 2016-11-02 NOTE — Plan of Care (Signed)
No need to complete bladder scan or In/Out since patient urinated - TTL bed change.

## 2016-11-02 NOTE — Progress Notes (Signed)
Kerri Carter at [place] NAME: Kerri Carter    MR#:  [id number]  DATE OF BIRTH:  08/02/1940  SUBJECTIVE:  CHIEF COMPLAINT:   Chief Complaint  Patient presents with  . Hip Pain  Right hip pain. Not sure if she can afford copay for STR/SNF as she has used all rehab days this yr REVIEW OF SYSTEMS:  Review of Systems  Constitutional: Negative for chills, fever and malaise/fatigue.  HENT: Negative for sore throat.   Eyes: Negative for blurred vision and double vision.  Respiratory: Negative for cough, hemoptysis, shortness of breath, wheezing and stridor.   Cardiovascular: Negative for chest pain, palpitations, orthopnea and leg swelling.  Gastrointestinal: Negative for abdominal pain, blood in stool, diarrhea, melena, nausea and vomiting.  Genitourinary: Negative for dysuria, flank pain and hematuria.  Musculoskeletal: Positive for joint pain. Negative for back pain.  Skin: Negative for rash.  Neurological: Negative for dizziness, sensory change, focal weakness, seizures, loss of consciousness, weakness and headaches.  Endo/Heme/Allergies: Negative for polydipsia.  Psychiatric/Behavioral: Negative for depression. The patient is not nervous/anxious.    DRUG ALLERGIES:   Allergies  Allergen Reactions  . Ceftin [Cefuroxime Axetil] Anaphylaxis    Other reaction(s): Other (See Comments) REACTION: tongue and throat swell Other Reaction: TONGUE AND THROAT SWELLING REACTION: tongue and throat swell  . Lisinopril Anaphylaxis    REACTION: tongue and throat swelling (onset 10-10-09)  . Morphine Other (See Comments)    Per patient, low blood pressure issues, following morphine, that requires action to raise it back up. Drop in BP - can take small infrequent doses  . Aspirin Other (See Comments)    Sulfasalazine allergy cross reacts  . Antihistamines, Chlorpheniramine-Type     REACTION: makes pt hyper  . Antivert [Meclizine Hcl]     Other  reaction(s): Unknown REACTION: bladder will not empty REACTION: bladder will not empty  . Decongestant [Pseudoephedrine Hcl]     Other reaction(s): Unknown REACTION: makes pt hyper REACTION: makes pt hyper  . Doxycycline     REACTION: GI upset  . Morphine And Related Other (See Comments)    Per patient, low blood pressure issues, following morphine, that requires action to raise it back up.  . Polymyxin B     Medication was in eye drops.  . Pseudoephedrine Hcl Other (See Comments)    REACTION: makes pt hyper  . Sulfa Antibiotics     Face swelling  . Xarelto [Rivaroxaban]     Other reaction(s): Other (See Comments) GI bleed Stomach burning, bleeding, and tar in stool GI bleed  . Adhesive [Tape] Rash  . Iodine Hives and Rash  . Levaquin [Levofloxacin In D5w] Rash  . Tetanus Toxoid Adsorbed Other (See Comments)    REACTION: rash, fever, hot to touch at injection site  . Tetanus Toxoids     REACTION: rash, fever, hot to touch at injection site   VITALS:  Blood pressure (!) 133/47, pulse 76, temperature 98.6 F (37 C), temperature source Oral, resp. rate 18, height 5\' 4"  (1.626 m), weight 72.6 kg (160 lb), SpO2 100 %. PHYSICAL EXAMINATION:  Physical Exam  Constitutional: She is oriented to person, place, and time and well-developed, well-nourished, and in no distress.  HENT:  Head: Normocephalic.  Mouth/Throat: Oropharynx is clear and moist.  Eyes: Pupils are equal, round, and reactive to light. Conjunctivae and EOM are normal. No scleral icterus.  Neck: Normal range of motion. Neck supple. No JVD present. No tracheal deviation  present.  Cardiovascular: Normal rate, regular rhythm and normal heart sounds.  Exam reveals no gallop.   No murmur heard. Pulmonary/Chest: Effort normal and breath sounds normal. No respiratory distress. She has no wheezes. She has no rales.  Abdominal: Soft. Bowel sounds are normal. She exhibits no distension. There is no tenderness. There is no  rebound.  Musculoskeletal: She exhibits no edema or tenderness.  Right leg incision site looks clean  Neurological: She is alert and oriented to person, place, and time. No cranial nerve deficit.  Skin: No rash noted. No erythema.  Psychiatric: Affect normal.   LABORATORY PANEL:  Female CBC  Recent Labs Lab 11/02/16 0321  WBC 12.1*  HGB 10.4*  HCT 31.1*  PLT 175   ------------------------------------------------------------------------------------------------------------------ Chemistries   Recent Labs Lab 11/02/16 0321  NA 140  K 4.9  CL 107  CO2 27  GLUCOSE 171*  BUN 16  CREATININE 1.27*  CALCIUM 8.2*   RADIOLOGY:  No results found. ASSESSMENT AND PLAN:  Kerri Carter  is a 76 y.o. female with a known history of inflammatory arthritis on Arava and Forteo,COPD, hypertension, hyperlipidemia comes in the emergency room after she sustained a mechanical fall while trying to get out of the car this afternoon. Patient complains of right hip pain and was found to have acute nondisplaced right femoral subcapital fracture Patient is being admitted for further eval and management.  1. Rt Hip pain due to Acute nondisplacedright femoral neck fracture status post mechanical fall S/p surgery POD 1 - further mgmt per ortho  2. History of mild AI and MR -Follows with [facility] cardiology -Echo shows EF of 60% in 2017 -Patient has no history of coronary artery disease had cardiac catheterization in the past -Resumed cardiac meds  3. Hypertension -Continue bystolic  4. Hyperlipidemia -Continue Mevacor  5. DVT prophylaxis on SCD and teds - on lovenox for DVT prophylaxis  Will get foley removed today    All the records are reviewed and case discussed with Care Management/Social Worker. Management plans discussed with the patient, nursing and they are in agreement.  CODE STATUS: Full Code  TOTAL TIME TAKING CARE OF THIS PATIENT: 35 minutes.   More than 50% of the time  was spent in counseling/coordination of care: YES  POSSIBLE D/C IN 1-2 DAYS, DEPENDING ON CLINICAL CONDITION. And placement   Max Sane M.D on 11/02/2016 at 5:46 PM  Between 7am to 6pm - Pager - [phone]  After 6pm go to [email or web address] - [facility]

## 2016-11-02 NOTE — Progress Notes (Signed)
Physical Therapy Treatment Patient Details Name: Kerri Carter MRN: [id number] DOB: 09-15-1940 Today's Date: 11/02/2016    History of Present Illness Pt is a 76 y/o F who presented s/p fall and was found to have nondisplaced R femoral subcapital fracture.  Pt is now s/p R hip bipolar hemiarthroplasty.  Pt's PMH includes conversion disorder, COPD, L THA, pt reported L-4 fusion in 09/2016.     PT Comments    Kerri Carter made modest progress this afternoon.  She demonstrates a significantly decreased gait speed due to pain and fatigue and she reuqires min assist at times due to instability.  She requires mod assist for sit<>stand transfers.  SNF remains appropriate d/c plan.     Follow Up Recommendations  SNF     Equipment Recommendations  Rolling walker with 5" wheels    Recommendations for Other Services       Precautions / Restrictions Precautions Precautions: Fall;Other (comment) Precaution Comments: Monitor O2, pt not on O2 at baseline during the day Required Braces or Orthoses: Spinal Brace Spinal Brace: Lumbar corset;Applied in sitting position (pt reports she is to wear this when OOB) Restrictions Weight Bearing Restrictions: Yes RLE Weight Bearing: Weight bearing as tolerated    Mobility  Bed Mobility Overal bed mobility: Needs Assistance Bed Mobility: Sit to Supine           General bed mobility comments: Cues for seqeuncing and techique and pt relies heavily on use of bed rail.  Pt requires assist to advance RLE to EOB and to elevate trunk.   Transfers Overall transfer level: Needs assistance Equipment used: Rolling walker (2 wheeled) Transfers: Sit to/from Stand Sit to Stand: Mod assist         General transfer comment: Assist to boost to standing with cues for proper and safe technique. Directional cues when back up to [facility].    Ambulation/Gait Ambulation/Gait assistance: Min assist Ambulation Distance (Feet): 15 Feet Assistive device: Rolling walker (2  wheeled) Gait Pattern/deviations: Shuffle;Decreased weight shift to right;Decreased stance time - right;Decreased step length - left;Antalgic;Trunk flexed Gait velocity: decreased Gait velocity interpretation: <1.8 ft/sec, indicative of risk for recurrent falls General Gait Details: Step by step cues for sequencing with RW.  Min assist at times due to instability and assist with RW around obstacles in room.  SpO2 remains at or above 98% on 3.5L O2 throughout session.     Stairs            Wheelchair Mobility    Modified Rankin (Stroke Patients Only)       Balance Overall balance assessment: Needs assistance Sitting-balance support: No upper extremity supported;Feet supported Sitting balance-Kerri Carter: Fair     Standing balance support: Bilateral upper extremity supported;During functional activity Standing balance-Kerri Carter: Poor Standing balance comment: Pt relies on UE support for static and dynamic activities                            Cognition Arousal/Alertness: Awake/alert Behavior During Therapy: WFL for tasks assessed/performed;Anxious (anxious about pain) Overall Cognitive Status: Within Functional Limits for tasks assessed                                        Exercises Total Joint Exercises Ankle Circles/Pumps: AROM;Both;10 reps;Seated Quad Sets: Strengthening;Both;10 reps;Seated Gluteal Sets: Strengthening;Both;10 reps;Seated Hip ABduction/ADduction: AAROM;Right;10 reps;Seated Long Arc Quad: AROM;Right;Seated;15 reps  General Comments        Pertinent Vitals/Pain Pain Assessment: Faces Faces Pain Carter: Hurts whole lot Pain Location: R hip (denies any back pain) Pain Descriptors / Indicators: Aching;Discomfort;Grimacing;Guarding;Moaning Pain Intervention(s): Limited activity within patient's tolerance;Monitored during session;Repositioned    Home Living                      Prior Function             PT Goals (current goals can now be found in the care plan section) Acute Rehab PT Goals Patient Stated Goal: to be able to walk PT Goal Formulation: With patient Time For Goal Achievement: 11/16/16 Potential to Achieve Goals: Good Progress towards PT goals: Progressing toward goals    Frequency    BID      PT Plan Current plan remains appropriate    Co-evaluation              AM-PAC PT "6 Clicks" Daily Activity  Outcome Measure  Difficulty turning over in bed (including adjusting bedclothes, sheets and blankets)?: Unable Difficulty moving from lying on back to sitting on the side of the bed? : Unable Difficulty sitting down on and standing up from a chair with arms (e.g., wheelchair, bedside commode, etc,.)?: Unable Help needed moving to and from a bed to chair (including a wheelchair)?: A Lot Help needed walking in hospital room?: A Lot Help needed climbing 3-5 steps with a railing? : Total 6 Click Score: 8    End of Session Equipment Utilized During Treatment: Gait belt;Oxygen Activity Tolerance: Patient limited by pain;Other (comment);Patient limited by fatigue (limited by fear of falling) Patient left: in bed;with call bell/phone within reach;with bed alarm set;with SCD's reapplied Nurse Communication: Mobility status PT Visit Diagnosis: Pain;Unsteadiness on feet (R26.81);Muscle weakness (generalized) (M62.81);Difficulty in walking, not elsewhere classified (R26.2) Pain - Right/Left: Right Pain - part of body: Hip     Time: 4010-2725 PT Time Calculation (min) (ACUTE ONLY): 48 min  Charges:  $Gait Training: 8-22 mins $Therapeutic Exercise: 8-22 mins $Therapeutic Activity: 8-22 mins                    G Codes:       Kerri Carter PT, DPT 11/02/2016, 3:23 PM

## 2016-11-02 NOTE — Evaluation (Signed)
Physical Therapy Evaluation Patient Details Name: Kerri Carter MRN: [id number] DOB: Jun 27, 1940 Today's Date: 11/02/2016   History of Present Illness  Pt is a 76 y/o F who presented s/p fall and was found to have nondisplaced R femoral subcapital fracture.  Pt is now s/p R hip bipolar hemiarthroplasty.  Pt's PMH includes conversion disorder, COPD, L THA, pt reported L-4 fusion in 09/2016.     Clinical Impression  Patient is s/p above surgery resulting in functional limitations due to the deficits listed below (see PT Problem List). Kerri Carter presents with anxiety about falling but is agreeable to work with PT.  She currently requires mod assist for bed mobility, transfers, and to ambulate 3 ft with RW.  Given pt's current mobility status, recommending SNF at d/c.  Patient will benefit from skilled PT to increase their independence and safety with mobility to allow discharge to the venue listed below.      Follow Up Recommendations SNF    Equipment Recommendations  Rolling walker with 5" wheels    Recommendations for Other Services       Precautions / Restrictions Precautions Precautions: Fall;Other (comment) Precaution Comments: Monitor O2, pt not on O2 at baseline during the day Required Braces or Orthoses: Spinal Brace Spinal Brace: Lumbar corset;Applied in sitting position (pt reports she is to wear this when OOB) Restrictions Weight Bearing Restrictions: Yes RLE Weight Bearing: Weight bearing as tolerated      Mobility  Bed Mobility Overal bed mobility: Needs Assistance Bed Mobility: Supine to Sit     Supine to sit: Mod assist;HOB elevated     General bed mobility comments: Cues for seqeuncing and technique and pt relies heavily on use of bed rail.  Pt requires assist to advance RLE to EOB and to elevate trunk.   Transfers Overall transfer level: Needs assistance Equipment used: Rolling walker (2 wheeled) Transfers: Sit to/from [facility] Sit to  Stand: Mod assist Stand pivot transfers: Mod assist       General transfer comment: Assist to boost to standing with cues for proper and safe technique.  Pt is slow to stand and hesitant to WB through RLE.  Assist to remain steady when pivoting to chair with max directional cues.   Ambulation/Gait Ambulation/Gait assistance: Mod assist Ambulation Distance (Feet): 3 Feet Assistive device: Rolling walker (2 wheeled) Gait Pattern/deviations: Shuffle;Decreased weight shift to right;Decreased stance time - right;Decreased step length - left;Antalgic;Trunk flexed Gait velocity: decreased Gait velocity interpretation: Below normal speed for age/gender General Gait Details: Pt requires max verbal step by step cues for sequencing using RW.  She has a significant fear of falling and requires assist x1 to pick up L foot to step with LLE.  Max directional cues to navigate to chair.    Stairs            Wheelchair Mobility    Modified Rankin (Stroke Patients Only)       Balance Overall balance assessment: Needs assistance Sitting-balance support: No upper extremity supported;Feet supported Sitting balance-Leahy Scale: Fair     Standing balance support: Bilateral upper extremity supported;During functional activity Standing balance-Leahy Scale: Poor Standing balance comment: Pt relies on UE support for static and dynamic activities                             Pertinent Vitals/Pain Pain Assessment: 0-10 Pain Score: 8  Pain Location: R hip (denies any back pain) Pain Descriptors /  Indicators: Aching;Discomfort;Grimacing;Guarding;Moaning Pain Intervention(s): Limited activity within patient's tolerance;Monitored during session;Repositioned    Home Living Family/patient expects to be discharged to:: Private residence Living Arrangements: Spouse/significant other;Other relatives (grandchildren) Available Help at Discharge: Family;Available PRN/intermittently (Husband  unable to provide physical assist) Type of Home: House Home Access: Ramped entrance     Home Layout: One level Home Equipment: Cane - quad;Cane - single point;Walker - standard;Bedside commode;Shower seat      Prior Function Level of Independence: Needs assistance   Gait / Transfers Assistance Needed: Since back surgery in August pt had been using standard walker up until 1 wk ago when she began using a cane.  She deines any additional falls in the past 6 months.    ADL's / Homemaking Assistance Needed: Pt has been bathing independently (sitting on shower seat), dressing independently.  Granddaughter does the cooking and cleaning.    Comments: Husband using a RW to ambualte and is unable to provide physical assist.      Hand Dominance        Extremity/Trunk Assessment   Upper Extremity Assessment Upper Extremity Assessment:  (BUE strength grossly 4/5)    Lower Extremity Assessment Lower Extremity Assessment: RLE deficits/detail RLE Deficits / Details: Limited ROM and strength as expected s/p R hip surgery RLE: Unable to fully assess due to pain       Communication   Communication: No difficulties  Cognition Arousal/Alertness: Awake/alert Behavior During Therapy: WFL for tasks assessed/performed;Anxious (anxious about pain) Overall Cognitive Status: Within Functional Limits for tasks assessed                                        General Comments General comments (skin integrity, edema, etc.): SpO2 down to 85% on RA with bed mobility, thus maintained 4L O2 for remainder of session with SpO2 remaining at or above 91%.     Exercises Total Joint Exercises Ankle Circles/Pumps: AROM;Both;10 reps;Supine Quad Sets: Strengthening;Both;10 reps;Supine Hip ABduction/ADduction: AAROM;Right;10 reps;Seated Long Arc Quad: AROM;Right;10 reps;Seated   Assessment/Plan    PT Assessment Patient needs continued PT services  PT Problem List Decreased  strength;Decreased range of motion;Decreased activity tolerance;Decreased balance;Decreased mobility;Decreased knowledge of use of DME;Decreased safety awareness;Pain;Cardiopulmonary status limiting activity       PT Treatment Interventions DME instruction;Gait training;Stair training;Functional mobility training;Therapeutic activities;Therapeutic exercise;Balance training;Neuromuscular re-education;Patient/family education;Wheelchair mobility training;Modalities    PT Goals (Current goals can be found in the Care Plan section)  Acute Rehab PT Goals Patient Stated Goal: to be able to walk PT Goal Formulation: With patient Time For Goal Achievement: 11/16/16 Potential to Achieve Goals: Good    Frequency BID   Barriers to discharge Decreased caregiver support Husband unable to provide the level of assist the pt currently requires    Co-evaluation               AM-PAC PT "6 Clicks" Daily Activity  Outcome Measure Difficulty turning over in bed (including adjusting bedclothes, sheets and blankets)?: Unable Difficulty moving from lying on back to sitting on the side of the bed? : Unable Difficulty sitting down on and standing up from a chair with arms (e.g., wheelchair, bedside commode, etc,.)?: Unable Help needed moving to and from a bed to chair (including a wheelchair)?: A Lot Help needed walking in hospital room?: A Lot Help needed climbing 3-5 steps with a railing? : Total 6 Click Score: 8    End  of Session Equipment Utilized During Treatment: Gait belt;Oxygen Activity Tolerance: Patient limited by pain;Other (comment);Patient limited by fatigue (limited by fear of falling) Patient left: in chair;with call bell/phone within reach;with chair alarm set Nurse Communication: Mobility status;Other (comment) (SpO2) PT Visit Diagnosis: Pain;Unsteadiness on feet (R26.81);Muscle weakness (generalized) (M62.81);Difficulty in walking, not elsewhere classified (R26.2) Pain -  Right/Left: Right Pain - part of body: Hip    Time: 3403-5248 PT Time Calculation (min) (ACUTE ONLY): 37 min   Charges:   PT Evaluation $PT Eval Low Complexity: 1 Low PT Treatments $Therapeutic Activity: 8-22 mins   PT G Codes:   PT G-Codes **NOT FOR INPATIENT CLASS** Functional Assessment Tool Used: AM-PAC 6 Clicks Basic Mobility;Clinical judgement Functional Limitation: Mobility: Walking and moving around Mobility: Walking and Moving Around Current Status (L8590): At least 80 percent but less than 100 percent impaired, limited or restricted Mobility: Walking and Moving Around Goal Status [phone]): At least 40 percent but less than 60 percent impaired, limited or restricted    Collie Siad PT, DPT 11/02/2016, 12:10 PM

## 2016-11-02 NOTE — Care Management Note (Signed)
Case Management Note  Patient Details  Name: Kerri Carter MRN: [id number] Date of Birth: 14-Sep-1940  Subjective/Objective:  Admitted to [facility] with the diagnosis of right hip fracture. Right hemiarthroplasty done  11/01/16.                   Action/Plan: Received referral for physical therapy, occupational therapy speech therapy and medical equipment, if needed. Evaluations pending.   Expected Discharge Date:                  Expected Discharge Plan:     In-House Referral:   yes  Discharge planning Services     Post Acute Care Choice:    Choice offered to:     DME Arranged:    DME Agency:     HH Arranged:    HH Agency:     Status of Service:     If discussed at [employer], dates discussed:    Additional Comments:  Shelbie Ammons, RN MSN CCM Care Management [phone] 11/02/2016, 11:03 AM

## 2016-11-02 NOTE — Clinical Social Work Placement (Signed)
   CLINICAL SOCIAL WORK PLACEMENT  NOTE  Date:  11/02/2016  Patient Details  Name: Kerri Carter MRN: [id number] Date of Birth: Sep 08, 1940  Clinical Social Work is seeking post-discharge placement for this patient at the [place] level of care (*CSW will initial, date and re-position this form in  chart as items are completed):  Yes   Patient/family provided with [facility] Work Department's list of facilities offering this level of care within the geographic area requested by the patient (or if unable, by the patient's family).  Yes   Patient/family informed of their freedom to choose among providers that offer the needed level of care, that participate in Medicare, Medicaid or managed care program needed by the patient, have an available bed and are willing to accept the patient.  Yes   Patient/family informed of [facility]'s ownership interest in [facility] and [facility], as well as of the fact that they are under no obligation to receive care at these facilities.  PASRR submitted to EDS on       PASRR number received on       Existing PASRR number confirmed on 11/01/16     FL2 transmitted to all facilities in geographic area requested by pt/family on 11/01/16     FL2 transmitted to all facilities within larger geographic area on       Patient informed that his/her managed care company has contracts with or will negotiate with certain facilities, including the following:        Yes   Patient/family informed of bed offers received.  Patient chooses bed at  [facility] )     Physician recommends and patient chooses bed at      Patient to be transferred to   on  .  Patient to be transferred to facility by       Patient family notified on   of transfer.  Name of family member notified:        PHYSICIAN       Additional Comment:    _______________________________________________ Netanya Yazdani, Veronia Beets, LCSW 11/02/2016, 2:56  PM

## 2016-11-02 NOTE — Plan of Care (Signed)
Dr. Text to inform that patient has not voided since Foley removed at 1000. Gave verbal orders to bladder scan, if find 500cc or more then complete In/Out cath.  Patient currently not distended or in any discomfort.

## 2016-11-02 NOTE — Progress Notes (Signed)
Subjective: 1 Day Post-Op Procedure(s) (LRB): ARTHROPLASTY BIPOLAR HIP (HEMIARTHROPLASTY) (Right) Patient reports pain as mild.   Patient is well, and has had no acute complaints or problems PT and Care management to assist with discharge planning. Negative for chest pain and shortness of breath Fever: no Gastrointestinal:Negative for nausea and vomiting Pt reports no change in chronic cough, on O2 at night while at home.  Objective: Vital signs in last 24 hours: Temp:  [97.4 F (36.3 C)-98.2 F (36.8 C)] 97.9 F (36.6 C) (10/01 0430) Pulse Rate:  [79-96] 79 (10/01 0430) Resp:  [11-18] 18 (10/01 0430) BP: (103-118)/(41-61) 109/46 (10/01 0430) SpO2:  [91 %-95 %] 93 % (10/01 0430)  Intake/Output from previous day:  Intake/Output Summary (Last 24 hours) at 11/02/16 0735 Last data filed at 11/02/16 0436  Gross per 24 hour  Intake             2255 ml  Output             1650 ml  Net              605 ml    Intake/Output this shift: No intake/output data recorded.  Labs:  Recent Labs  10/31/16 1828 11/02/16 0321  HGB 11.4* 10.4*    Recent Labs  10/31/16 1828 11/02/16 0321  WBC 9.9 12.1*  RBC 4.12 3.72*  HCT 34.4* 31.1*  PLT 191 175    Recent Labs  10/31/16 1828 11/02/16 0321  NA 137 140  K 4.1 4.9  CL 101 107  CO2 26 27  BUN 14 16  CREATININE 1.40* 1.27*  GLUCOSE 129* 171*  CALCIUM 9.1 8.2*    Recent Labs  10/31/16 1828  INR 0.99     EXAM General - Patient is Alert, Appropriate and Oriented Extremity - ABD soft Sensation intact distally Intact pulses distally Dorsiflexion/Plantar flexion intact Incision: dressing C/D/I No cellulitis present Dressing/Incision - clean, dry, no drainage Motor Function - intact, moving foot and toes well on exam.  Abdomen soft to palpation with normal BS.  Past Medical History:  Diagnosis Date  . Anxiety   . Asthma   . Chicken pox   . CKD (chronic kidney disease), stage III    a. s/p R nephrectomy.  .  Conversion disorder   . COPD (chronic obstructive pulmonary disease) ([place])   . Depression   . Essential hypertension   . GERD (gastroesophageal reflux disease)   . Hyperlipidemia   . Inflammatory arthritis    a. hands/carpal tunnel.  b. Low titer rheumatoid factor. c. Negative anti-CCP antibodies. d. Plaquenil.  . Non-Obstructive CAD    a. 07/2009 Cath ([facility]): nonobs dzs;  b. 03/2011 Cath [facility]): nonobs dzs.  . Osteoarthritis    a. Knees.  . PUD (peptic ulcer disease)   . Toxic maculopathy   . Valvular heart disease    a. 07/2015 Echo: EF 55-60%, Mild AI, AS, MR, and TR.    Assessment/Plan: 1 Day Post-Op Procedure(s) (LRB): ARTHROPLASTY BIPOLAR HIP (HEMIARTHROPLASTY) (Right) Active Problems:   Hip fracture (HCC)  Estimated body mass index is 27.46 kg/m as calculated from the following:   Height as of this encounter: 5\' 4"  (1.626 m).   Weight as of this encounter: 72.6 kg (160 lb). Advance diet Up with therapy D/C IV fluids when tolerating po intake.  Labs reviewed, Hg 10.4, Cr 1.27, improved from 1.40, continue to monitor. Begin working on having a BM today. CBC and BMP ordered for tomorrow.  DVT Prophylaxis -  Lovenox, Foot Pumps and TED hose Weight-Bearing as tolerated to right leg  J. [name] Proud, PA-C [facility] 11/02/2016, 7:35 AM

## 2016-11-02 NOTE — Anesthesia Postprocedure Evaluation (Signed)
Anesthesia Post Note  Patient: Kerri Carter  Procedure(s) Performed: ARTHROPLASTY BIPOLAR HIP (HEMIARTHROPLASTY) (Right )  Patient location during evaluation: Nursing Unit Anesthesia Type: Spinal Level of consciousness: awake, awake and alert and oriented Pain management: pain level controlled Vital Signs Assessment: post-procedure vital signs reviewed and stable Respiratory status: spontaneous breathing, nonlabored ventilation and respiratory function stable Cardiovascular status: stable Postop Assessment: no headache, no backache, adequate PO intake, no apparent nausea or vomiting and patient able to bend at knees Anesthetic complications: no     Last Vitals:  Vitals:   11/01/16 2322 11/02/16 0430  BP: (!) 115/42 (!) 109/46  Pulse: 80 79  Resp: 18 18  Temp: 36.8 C 36.6 C  SpO2: 91% 93%    Last Pain:  Vitals:   11/02/16 0430  TempSrc: Oral  PainSc:                  Ricki Miller

## 2016-11-02 NOTE — Progress Notes (Signed)
PT Cancellation Note  Patient Details Name: Kerri Carter MRN: [id number] DOB: 1940-09-29   Cancelled Treatment:    Reason Eval/Treat Not Completed: Patient declined, no reason specified.  Attempted to see pt x2 this afternoon.  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  On second attempt the pt was meeting with representative for [facility].  Will continue to follow acutely.   Collie Siad PT, DPT 11/02/2016, 2:21 PM

## 2016-11-02 NOTE — Progress Notes (Signed)
Per [facility] admissions coordinator at [facility] patient is in her co-pay days and they require $700 up front for 2 weeks ($50 per day). Magda Paganini met with patient and her husband at bedside and made them aware of above.  CSW met with patient to see what she decided about paying the co-pays. Patient reported that she would have to talk to her husband about the co-pays and doesn't know if she can pay that. CSW explained that every SNF is requiring co-pays up front now. Patient requested the SNF list with bed offers again, which CSW provided. CSW emphasized that patient will likely be ready for D/C tomorrow and will have to make a decision soon. CSW explained that if patient can't pay the money up front she will have to go home with home health. Patient reported that she would have a decision made by tomorrow morning around 10 am. MD and RN aware of above.    [employer], LCSW [phone]

## 2016-11-02 NOTE — Progress Notes (Signed)
Decreased to 3l after neb treatment. Patient wears 2l at home.

## 2016-11-03 ENCOUNTER — Telehealth: Payer: Self-pay | Admitting: *Deleted

## 2016-11-03 LAB — CBC
HCT: 33.3 % — ABNORMAL LOW (ref 35.0–47.0)
Hemoglobin: 11 g/dL — ABNORMAL LOW (ref 12.0–16.0)
MCH: 27.3 pg (ref 26.0–34.0)
MCHC: 33 g/dL (ref 32.0–36.0)
MCV: 82.8 fL (ref 80.0–100.0)
Platelets: 189 10*3/uL (ref 150–440)
RBC: 4.02 MIL/uL (ref 3.80–5.20)
RDW: 16.3 % — ABNORMAL HIGH (ref 11.5–14.5)
WBC: 11.2 10*3/uL — ABNORMAL HIGH (ref 3.6–11.0)

## 2016-11-03 LAB — BASIC METABOLIC PANEL
Anion gap: 7 (ref 5–15)
BUN: 13 mg/dL (ref 6–20)
CO2: 28 mmol/L (ref 22–32)
Calcium: 8.2 mg/dL — ABNORMAL LOW (ref 8.9–10.3)
Chloride: 100 mmol/L — ABNORMAL LOW (ref 101–111)
Creatinine, Ser: 1.06 mg/dL — ABNORMAL HIGH (ref 0.44–1.00)
GFR calc Af Amer: 58 mL/min — ABNORMAL LOW (ref >60)
GFR calc non Af Amer: 50 mL/min — ABNORMAL LOW (ref >60)
Glucose, Bld: 107 mg/dL — ABNORMAL HIGH (ref 65–99)
Potassium: 4.1 mmol/L (ref 3.5–5.1)
Sodium: 135 mmol/L (ref 135–145)

## 2016-11-03 LAB — SURGICAL PATHOLOGY

## 2016-11-03 MED ORDER — OXYCODONE HCL 5 MG PO TABS: 5.0000 mg | ORAL_TABLET | Freq: Four times a day (QID) | ORAL | 0 refills | Status: DC | PRN

## 2016-11-03 MED ORDER — BISACODYL 10 MG RE SUPP: 10.0000 mg | Freq: Every day | RECTAL | 0 refills | Status: DC | PRN

## 2016-11-03 MED ORDER — LORAZEPAM 0.5 MG PO TABS: 0.5000 mg | ORAL_TABLET | Freq: Every day | ORAL | 0 refills | Status: DC

## 2016-11-03 MED ORDER — DOCUSATE SODIUM 100 MG PO CAPS: 100.0000 mg | ORAL_CAPSULE | Freq: Two times a day (BID) | ORAL | 0 refills | Status: DC

## 2016-11-03 MED ORDER — TERIPARATIDE (RECOMBINANT) 600 MCG/2.4ML ~~LOC~~ SOLN: 0.0800 mL | Freq: Every day | SUBCUTANEOUS | 0 refills | Status: DC

## 2016-11-03 NOTE — Progress Notes (Addendum)
[place] [facility] [place] NAME: Kerri Carter    MR#:  [id number]  DATE OF BIRTH:  [birth year]  SUBJECTIVE:  CHIEF COMPLAINT:   Chief Complaint  Patient presents with  . Hip Pain  Not feeling well, seems somewhat confused REVIEW OF SYSTEMS:  Review of Systems  Constitutional: Negative for chills, fever and malaise/fatigue.  HENT: Negative for sore throat.   Eyes: Negative for blurred vision and double vision.  Respiratory: Negative for cough, hemoptysis, shortness of breath, wheezing and stridor.   Cardiovascular: Negative for chest pain, palpitations, orthopnea and leg swelling.  Gastrointestinal: Negative for abdominal pain, blood in stool, diarrhea, melena, nausea and vomiting.  Genitourinary: Negative for dysuria, flank pain and hematuria.  Musculoskeletal: Positive for joint pain. Negative for back pain.  Skin: Negative for rash.  Neurological: Negative for dizziness, sensory change, focal weakness, seizures, loss of consciousness, weakness and headaches.  Endo/Heme/Allergies: Negative for polydipsia.  Psychiatric/Behavioral: Positive for hallucinations. Negative for depression. The patient is not nervous/anxious.    DRUG ALLERGIES:   Allergies  Allergen Reactions  . Ceftin [Cefuroxime Axetil] Anaphylaxis    Other reaction(s): Other (See Comments) REACTION: tongue and throat swell Other Reaction: TONGUE AND THROAT SWELLING REACTION: tongue and throat swell  . Lisinopril Anaphylaxis    REACTION: tongue and throat swelling (onset 10-10-09)  . Morphine Other (See Comments)    Per patient, low blood pressure issues, following morphine, that requires action to raise it back up. Drop in BP - can take small infrequent doses  . Aspirin Other (See Comments)    Sulfasalazine allergy cross reacts  . Antihistamines, Chlorpheniramine-Type     REACTION: makes pt hyper  . Antivert [Meclizine Hcl]     Other reaction(s): Unknown REACTION:  bladder will not empty REACTION: bladder will not empty  . Decongestant [Pseudoephedrine Hcl]     Other reaction(s): Unknown REACTION: makes pt hyper REACTION: makes pt hyper  . Doxycycline     REACTION: GI upset  . Morphine And Related Other (See Comments)    Per patient, low blood pressure issues, following morphine, that requires action to raise it back up.  . Polymyxin B     Medication was in eye drops.  . Pseudoephedrine Hcl Other (See Comments)    REACTION: makes pt hyper  . Sulfa Antibiotics     Face swelling  . Xarelto [Rivaroxaban]     Other reaction(s): Other (See Comments) GI bleed Stomach burning, bleeding, and tar in stool GI bleed  . Adhesive [Tape] Rash  . Iodine Hives and Rash  . Levaquin [Levofloxacin In D5w] Rash  . Tetanus Toxoid Adsorbed Other (See Comments)    REACTION: rash, fever, hot to touch at injection site  . Tetanus Toxoids     REACTION: rash, fever, hot to touch at injection site   VITALS:  Blood pressure (!) 146/49, pulse 81, temperature 99.9 F (37.7 C), temperature source Oral, resp. rate 19, height 5\' 4"  (1.626 m), weight 72.6 kg (160 lb), SpO2 99 %. PHYSICAL EXAMINATION:  Physical Exam  Constitutional: She is well-developed, well-nourished, and in no distress.  HENT:  Head: Normocephalic.  Mouth/Throat: Oropharynx is clear and moist.  Eyes: Pupils are equal, round, and reactive to light. Conjunctivae and EOM are normal. No scleral icterus.  Neck: Normal range of motion. Neck supple. No JVD present. No tracheal deviation present.  Cardiovascular: Normal rate, regular rhythm and normal heart sounds.  Exam reveals no gallop.   No  murmur heard. Pulmonary/Chest: Effort normal and breath sounds normal. No respiratory distress. She has no wheezes. She has no rales.  Abdominal: Soft. Bowel sounds are normal. She exhibits no distension. There is no tenderness. There is no rebound.  Musculoskeletal: She exhibits no edema or tenderness.  Right leg  incision site looks clean  Neurological: She is alert. She is disoriented. No cranial nerve deficit.  Skin: No rash noted. No erythema.  Psychiatric: Her affect is inappropriate.  Confused   LABORATORY PANEL:  Female CBC  Recent Labs Lab 11/03/16 0355  WBC 11.2*  HGB 11.0*  HCT 33.3*  PLT 189   ------------------------------------------------------------------------------------------------------------------ Chemistries   Recent Labs Lab 11/03/16 0355  NA 135  K 4.1  CL 100*  CO2 28  GLUCOSE 107*  BUN 13  CREATININE 1.06*  CALCIUM 8.2*   RADIOLOGY:  No results found. ASSESSMENT AND PLAN:  Kerri Carter  is a 76 y.o. female with a known history of inflammatory arthritis on Arava and Forteo,COPD, hypertension, hyperlipidemia comes in the emergency room after she sustained a mechanical fall while trying to get out of the car this afternoon. Patient complains of right hip pain and was found to have acute nondisplaced right femoral subcapital fracture Patient is being admitted for further eval and management.  *Acute metabolic encephalopathy/confusion -Likely due to pain medication  -Stop IV Dilaudid. monitor  1. Rt Hip pain due to Acute nondisplacedright femoral neck fracture status post mechanical fall S/p surgery POD 2 - further mgmt per ortho -Discontinue Dilaudid due to confusion  2. History of mild AI and MR -Follows with [facility]ology -Echo shows EF of 60% in 2017 -Patient has no history of coronary artery disease had cardiac catheterization in the past -Resumed cardiac meds  3. Hypertension -Continue bystolic  4. Hyperlipidemia -Continue Mevacor  5. DVT prophylaxis on SCD and teds - on lovenox for DVT prophylaxis  Needs bowel movement    All the records are reviewed and case discussed with Care Management/Social Worker. Management plans discussed with the patient, nursing and they are in agreement.  CODE STATUS: Full Code  TOTAL TIME  TAKING CARE OF THIS PATIENT: 35 minutes.   More than 50% of the time was spent in counseling/coordination of care: YES  POSSIBLE D/C IN 1 DAYS, DEPENDING ON CLINICAL CONDITION. And placement (likely to [facility])   Max Sane M.D on 11/03/2016 at 1:03 PM  Between 7am to 6pm - Pager - [phone]  After 6pm go to [email or web address] - [facility]

## 2016-11-03 NOTE — Progress Notes (Signed)
Subjective: 2 Days Post-Op Procedure(s) (LRB): ARTHROPLASTY BIPOLAR HIP (HEMIARTHROPLASTY) (Right) Patient reports pain as moderate.   Patient is complaining of increased pain in the right hip today. Plan is for discharge to SNF today. Negative for chest pain and shortness of breath Fever: 99.9 Gastrointestinal:Negative for nausea and vomiting Pt reports no change in chronic cough, on O2 at night while at home.  Objective: Vital signs in last 24 hours: Temp:  [98.6 F (37 C)-99.9 F (37.7 C)] 99.9 F (37.7 C) (10/01 2003) Pulse Rate:  [76-81] 81 (10/01 2003) Resp:  [19] 19 (10/01 2003) BP: (133-146)/(47-49) 146/49 (10/01 2003) SpO2:  [96 %-100 %] 99 % (10/01 2356)  Intake/Output from previous day:  Intake/Output Summary (Last 24 hours) at 11/03/16 1033 Last data filed at 11/03/16 0900  Gross per 24 hour  Intake              360 ml  Output             1152 ml  Net             -792 ml    Intake/Output this shift: Total I/O In: 120 [P.O.:120] Out: -   Labs:  Recent Labs  10/31/16 1828 11/02/16 0321 11/03/16 0355  HGB 11.4* 10.4* 11.0*    Recent Labs  11/02/16 0321 11/03/16 0355  WBC 12.1* 11.2*  RBC 3.72* 4.02  HCT 31.1* 33.3*  PLT 175 189    Recent Labs  11/02/16 0321 11/03/16 0355  NA 140 135  K 4.9 4.1  CL 107 100*  CO2 27 28  BUN 16 13  CREATININE 1.27* 1.06*  GLUCOSE 171* 107*  CALCIUM 8.2* 8.2*    Recent Labs  10/31/16 1828  INR 0.99     EXAM General - Patient is Alert, Appropriate and Oriented Extremity - ABD soft Sensation intact distally Intact pulses distally Dorsiflexion/Plantar flexion intact Incision: moderate drainage No cellulitis present Dressing/Incision - Bloody drainage. Motor Function - intact, moving foot and toes well on exam.  Abdomen soft to palpation with normal BS.  Past Medical History:  Diagnosis Date  . Anxiety   . Asthma   . Chicken pox   . CKD (chronic kidney disease), stage III ([place])    a. s/p  R nephrectomy.  . Conversion disorder   . COPD (chronic obstructive pulmonary disease) ([place])   . Depression   . Essential hypertension   . GERD (gastroesophageal reflux disease)   . Hyperlipidemia   . Inflammatory arthritis    a. hands/carpal tunnel.  b. Low titer rheumatoid factor. c. Negative anti-CCP antibodies. d. Plaquenil.  . Non-Obstructive CAD    a. 07/2009 Cath ([facility]): nonobs dzs;  b. 03/2011 Cath [facility]): nonobs dzs.  . Osteoarthritis    a. Knees.  . PUD (peptic ulcer disease)   . Toxic maculopathy   . Valvular heart disease    a. 07/2015 Echo: EF 55-60%, Mild AI, AS, MR, and TR.    Assessment/Plan: 2 Days Post-Op Procedure(s) (LRB): ARTHROPLASTY BIPOLAR HIP (HEMIARTHROPLASTY) (Right) Active Problems:   Hip fracture (HCC)  Estimated body mass index is 27.46 kg/m as calculated from the following:   Height as of this encounter: 5\' 4"  (1.626 m).   Weight as of this encounter: 72.6 kg (160 lb). Advance diet Up with therapy D/C IV fluids when tolerating po intake.  Labs reviewed, Hg 11.0 Cr 1.06, improved from 1.27, continue to monitor. Begin working on having a BM today. Low grade fever last night, WBC  11.2, will obtain UA to r/o UTI. Possible discharge today pending progress.  Upon discharge today continue Lovenox 40mg  daily x14 days. Staples can be removed by SNF on 11/16/16, follow-up with [facility] in 6 weeks for x-rays of the right hip.  DVT Prophylaxis - Lovenox, Foot Pumps and TED hose Weight-Bearing as tolerated to right leg  J. [name] Proud, PA-C [facility] 11/03/2016, 10:33 AM

## 2016-11-03 NOTE — Progress Notes (Signed)
Physical Therapy Treatment Patient Details Name: Kerri Carter MRN: [id number] DOB: [birth year] Today's Date: 11/03/2016    History of Present Illness Pt is a 76 y/o F who presented s/p fall and was found to have nondisplaced R femoral subcapital fracture.  Pt is now s/p R hip bipolar hemiarthroplasty.  Pt's PMH includes conversion disorder, COPD, L THA, pt reported L-4 fusion in 09/2016.    PT Comments    Kerri Carter continues to be limited by her confusion and pain. She requires extensive education and step by step cues to perform therapeutic exercises this session. She requires max assist for bed mod assist for supine>sit and was unable to maintain her balance sitting EOB due to posterior bias.  SNF remains appropriate d/c plan.   Follow Up Recommendations  SNF     Equipment Recommendations  Rolling walker with 5" wheels    Recommendations for Other Services       Precautions / Restrictions Precautions Precautions: Fall;Other (comment) Precaution Comments: Monitor O2, pt not on O2 at baseline during the day Required Braces or Orthoses: Spinal Brace Spinal Brace: Lumbar corset;Applied in sitting position (pt reports she is to wear this when OOB) Restrictions Weight Bearing Restrictions: Yes RLE Weight Bearing: Weight bearing as tolerated    Mobility  Bed Mobility Overal bed mobility: Needs Assistance Bed Mobility: Supine to Sit;Sit to Supine;Rolling Rolling: Max assist   Supine to sit: HOB elevated;Mod assist Sit to supine: Max assist;+2 for physical assistance   General bed mobility comments: Pt relies heavily on bed rail and cues for sequencing.  Assist to advance BLEs to EOB and to elevate trunk.  Hand over hand to place hands on bed to let go of bed rail once in sitting and pt able to sit EOB without assist for up to 30 seconds this session.  Pt fatigues quickly and demonstrates a posterior lean and requires max +2 assist to return to supine.   Transfers                 General transfer comment: Unable to attempt as pt unable to maintain sitting balance.  Ambulation/Gait                 Stairs            Wheelchair Mobility    Modified Rankin (Stroke Patients Only)       Balance Overall balance assessment: Needs assistance Sitting-balance support: Feet supported;Bilateral upper extremity supported Sitting balance-Leahy Scale: Poor Sitting balance - Comments: Pt requires cues to lean anteriorly to offset posterior lean which pt has difficulty performing.  She requires intermittent assist sitting EOB to prevent posterior LOB.   Postural control: Left lateral lean;Posterior lean                                  Cognition Arousal/Alertness: Awake/alert;Lethargic Behavior During Therapy: Anxious (anxious about pain) Overall Cognitive Status: Impaired/Different from baseline Area of Impairment: Attention;Following commands;Problem solving                   Current Attention Level: Sustained   Following Commands: Follows one step commands inconsistently     Problem Solving: Slow processing;Decreased initiation;Difficulty sequencing;Requires verbal cues;Requires tactile cues General Comments: Pt continues to not follow commands consistently and is very limited by pain.  She requires extensive education and step by step cues to perfomr therapeutic exercises this session.  Exercises Total Joint Exercises Ankle Circles/Pumps: AROM;Both;10 reps;Supine Quad Sets: Strengthening;Both;10 reps;Supine Heel Slides: AAROM;Right;10 reps;Supine Hip ABduction/ADduction: AAROM;Right;10 reps;Supine Straight Leg Raises: AAROM;Right;10 reps;Supine    General Comments General comments (skin integrity, edema, etc.): Pt removes [place] to blow her nose with SpO2 dropping to 86% on RA.  Maintained 3L O2 for remainder of session with SpO2 remaining at or above 88%.  Husband in room and provides encouragement to pt to  participate.       Pertinent Vitals/Pain Pain Assessment: Faces Faces Pain Scale: Hurts worst Pain Location: R hip (denies any back pain) Pain Descriptors / Indicators: Aching;Discomfort;Grimacing;Guarding;Moaning;Other (Comment) (shouting) Pain Intervention(s): Limited activity within patient's tolerance;Monitored during session    Home Living                      Prior Function            PT Goals (current goals can now be found in the care plan section) Acute Rehab PT Goals Patient Stated Goal: decreased pain PT Goal Formulation: With patient Time For Goal Achievement: 11/16/16 Potential to Achieve Goals: Fair Progress towards PT goals: Not progressing toward goals - comment (due to confusion, pain)    Frequency    BID      PT Plan Current plan remains appropriate    Co-evaluation              AM-PAC PT "6 Clicks" Daily Activity  Outcome Measure  Difficulty turning over in bed (including adjusting bedclothes, sheets and blankets)?: Unable Difficulty moving from lying on back to sitting on the side of the bed? : Unable Difficulty sitting down on and standing up from a chair with arms (e.g., wheelchair, bedside commode, etc,.)?: Unable Help needed moving to and from a bed to chair (including a wheelchair)?: Total Help needed walking in hospital room?: Total Help needed climbing 3-5 steps with a railing? : Total 6 Click Score: 6    End of Session Equipment Utilized During Treatment: Oxygen Activity Tolerance: Patient limited by pain Patient left: in bed;with call bell/phone within reach;with bed alarm set;with SCD's reapplied;with family/visitor present Nurse Communication: Mobility status PT Visit Diagnosis: Pain;Unsteadiness on feet (R26.81);Muscle weakness (generalized) (M62.81);Difficulty in walking, not elsewhere classified (R26.2) Pain - Right/Left: Right Pain - part of body: Hip     Time: 7591-6384 PT Time Calculation (min) (ACUTE  ONLY): 25 min  Charges:  $Therapeutic Exercise: 8-22 mins $Therapeutic Activity: 8-22 mins                    G Codes:       Kerri Carter PT, DPT 11/03/2016, 2:33 PM

## 2016-11-03 NOTE — Progress Notes (Signed)
Left message to her husband at [phone]

## 2016-11-03 NOTE — Progress Notes (Signed)
Physical Therapy Treatment Patient Details Name: Kerri Carter MRN: [id number] DOB: Mar 27, 1940 Today's Date: 11/03/2016    History of Present Illness Pt is a 76 y/o F who presented s/p fall and was found to have nondisplaced R femoral subcapital fracture.  Pt is now s/p R hip bipolar hemiarthroplasty.  Pt's PMH includes conversion disorder, COPD, L THA, pt reported L-4 fusion in 09/2016.    PT Comments    Pt did not make progress with mobility this session.   Pt does not follow majority of commands but suspect this may be related to her not attempting to participate due to pain.  She keeps her eyes closed for half of the session. With all aspects of mobility the pt strongly resists rather than attempting to assist, despite max education and encouragement provided. She required max assist for supine>sit and max +2 assist for sit>supine while shouting out in pain.  She refuses to participate in therapeutic exercises.  Pt oriented x4.  RN aware of pt's confusion.  SNF remains appropriate d/c plan.   Follow Up Recommendations  SNF     Equipment Recommendations  Rolling walker with 5" wheels    Recommendations for Other Services       Precautions / Restrictions Precautions Precautions: Fall;Other (comment) Precaution Comments: Monitor O2, pt not on O2 at baseline during the day Required Braces or Orthoses: Spinal Brace Spinal Brace: Lumbar corset;Applied in sitting position (pt reports she is to wear this when OOB) Restrictions Weight Bearing Restrictions: Yes RLE Weight Bearing: Weight bearing as tolerated    Mobility  Bed Mobility Overal bed mobility: Needs Assistance Bed Mobility: Supine to Sit;Sit to Supine;Rolling Rolling: Max assist   Supine to sit: Max assist;HOB elevated Sit to supine: Max assist;+2 for physical assistance   General bed mobility comments: Cues for sequencing and technique but pt resists this PT with all aspects of mobility.  She requires heavy use of the  bed rail and assist to advance LEs to EOB and to elevate trunk.  Once sitting the pt leans on the bed rail and refuses to attempt sitting upright.  To return to supine she requires +2 max assist, shouting out in pain.   Transfers                 General transfer comment: Unable to attempt   Ambulation/Gait                 Stairs            Wheelchair Mobility    Modified Rankin (Stroke Patients Only)       Balance Overall balance assessment: Needs assistance Sitting-balance support: Feet supported;Bilateral upper extremity supported Sitting balance-Leahy Scale: Poor Sitting balance - Comments: Pt unable (although no effort noted) to sit EOB without leaning onto bed rail.  With attempt of hand over hand to move hand from rail the pt grips tightly and refuses to release.  Postural control: Left lateral lean;Posterior lean                                  Cognition Arousal/Alertness: Awake/alert;Lethargic (pt keeps her eyes closed for half of the session) Behavior During Therapy: Anxious (anxious about pain) Overall Cognitive Status: Impaired/Different from baseline Area of Impairment: Attention;Following commands;Problem solving                   Current Attention Level: Sustained   Following  Commands: Follows one step commands inconsistently     Problem Solving: Slow processing;Decreased initiation;Difficulty sequencing;Requires verbal cues;Requires tactile cues General Comments: Pt does not follow majority of commands but suspect this may be related to her not attempting due to pain.  She keeps her eyes closed for half of the session and does not answer question asked, "what is different today?".    With all aspects of mobility the pt strongly resists rather than attempting to assist, despite max education and encouragement provided.       Exercises Total Joint Exercises Ankle Circles/Pumps: AROM;Both;10 reps;Supine    General  Comments General comments (skin integrity, edema, etc.): Pt removes [place] to blow her nose with SpO2 dropping to 86% on RA.  Maintained 3L O2 for remainder of session with SpO2 remaining at or above 88%.  Husband in room and provides encouragement to pt to participate.       Pertinent Vitals/Pain Pain Assessment: Faces Faces Pain Scale: Hurts worst Pain Location: R hip (denies any back pain) Pain Descriptors / Indicators: Aching;Discomfort;Grimacing;Guarding;Moaning;Other (Comment) (shouting) Pain Intervention(s): Limited activity within patient's tolerance;Monitored during session    Home Living                      Prior Function            PT Goals (current goals can now be found in the care plan section) Acute Rehab PT Goals Patient Stated Goal: decreased pain PT Goal Formulation: With patient Time For Goal Achievement: 11/16/16 Potential to Achieve Goals: Fair Progress towards PT goals: Not progressing toward goals - comment (due to pain, confusion, refussal)    Frequency    BID      PT Plan Current plan remains appropriate    Co-evaluation              AM-PAC PT "6 Clicks" Daily Activity  Outcome Measure  Difficulty turning over in bed (including adjusting bedclothes, sheets and blankets)?: Unable Difficulty moving from lying on back to sitting on the side of the bed? : Unable Difficulty sitting down on and standing up from a chair with arms (e.g., wheelchair, bedside commode, etc,.)?: Unable Help needed moving to and from a bed to chair (including a wheelchair)?: Total Help needed walking in hospital room?: Total Help needed climbing 3-5 steps with a railing? : Total 6 Click Score: 6    End of Session Equipment Utilized During Treatment: Oxygen Activity Tolerance: Patient limited by pain;Other (comment);Patient limited by fatigue (question amount of effort provided by pt) Patient left: in bed;with call bell/phone within reach;with bed alarm  set;with nursing/sitter in room;Other (comment) (nurse tech in room) Nurse Communication: Mobility status;Other (comment) (cognitive status, RN already aware. ) PT Visit Diagnosis: Pain;Unsteadiness on feet (R26.81);Muscle weakness (generalized) (M62.81);Difficulty in walking, not elsewhere classified (R26.2) Pain - Right/Left: Right Pain - part of body: Hip     Time: 1005-1035 PT Time Calculation (min) (ACUTE ONLY): 30 min  Charges:  $Therapeutic Activity: 23-37 mins                    G Codes:       Collie Siad PT, DPT 11/03/2016, 11:50 AM

## 2016-11-03 NOTE — Care Management (Signed)
MD has stopped discharge to SNF today due to increased confusion. Patient POD2 arthroplasty of right hip.

## 2016-11-03 NOTE — Progress Notes (Signed)
Clinical Education officer, museum (CSW) met with patient to see what she has decided about paying the co-pays. Patient reported that she spoke to her husband and she will go to [facility] but doesn't understand why she has to pay the money up front. CSW explained that [facility] will not accept her unless she pays the co-pays up front. CSW made patient aware that if she can't pay the co-pays or make a decision she will have to go home with home health and will D/C today. CSW contacted patient's son Rush Landmark and made him aware of above. CSW explained home health options to [place]. Per Rush Landmark he will call patient's husband and get back with CSW. CSW made Bill aware that patient will D/C today.   [employer], LCSW [phone]

## 2016-11-03 NOTE — Telephone Encounter (Signed)
Pt will discharge from [facility] on 11/03/16. Pt will discharge to [facility].  Please call [name] at [facility] with appt time and date if this is scheduled today [phone] Dr.Sonnenberg is requested

## 2016-11-03 NOTE — Progress Notes (Signed)
Per MD patient is not stable for D/C today. Patient's husband [name] paid [facility] the requested amount for the co-pays today. Per [facility] admissions coordinator at [facility] patient can come to room 503 when medically stable. CSW made husband aware that patient will not D/C today.   [employer], LCSW [phone]

## 2016-11-03 NOTE — Progress Notes (Addendum)
Pt yells out, "I'm not going to the wedding".  Pt aware of self, situation. Pt is forgetful. Pt easily re-oriented. Pt can follow commands. Rept to Dr. Manuella Ghazi. Will hold discharge until the AM. Will continue to monitor.

## 2016-11-03 NOTE — Discharge Instructions (Signed)
Hip Fracture A hip fracture is a fracture of the upper part of your thigh bone (femur). What are the causes? A hip fracture is caused by a direct blow to the side of your hip. This is usually the result of a fall but can occur in other circumstances, such as an automobile accident. What increases the risk? There is an increased risk of hip fractures in people with:  An unsteady walking pattern (gait) and those with conditions that contribute to poor balance, such as Parkinson's disease or dementia.  Osteopenia and osteoporosis.  Cancer that spreads to the leg bones.  Certain metabolic diseases.  What are the signs or symptoms? Symptoms of hip fracture include:  Pain over the injured hip.  Inability to put weight on the leg in which the fracture occurred (although, some patients are able to walk after a hip fracture).  Toes and foot of the affected leg point outward when you lie down.  How is this diagnosed? A physical exam can determine if a hip fracture is likely to have occurred. X-ray exams are needed to confirm the fracture and to look for other injuries. The X-ray exam can help to determine the type of hip fracture. Rarely, the fracture is not visible on an X-ray image and a CT scan or MRI will have to be done. How is this treated? The treatment for a fracture is usually surgery. This means using a screw, nail, or rod to hold the bones in place. Follow these instructions at home: Take all medicines as directed by your health care provider. Contact a health care provider if: Pain continues, even after taking pain medicine. This information is not intended to replace advice given to you by your health care provider. Make sure you discuss any questions you have with your health care provider. Document Released: 01/19/2005 Document Revised: 06/27/2015 Document Reviewed: 08/31/2012 Elsevier Interactive Patient Education  2017 [employer].   Upon discharge today continue Lovenox  40mg  daily x14 days. [facility] can be removed by SNF on 11/16/16, follow-up with [facility] in 6 weeks for x-rays of the right hip.

## 2016-11-04 ENCOUNTER — Ambulatory Visit: Payer: Medicare Other | Attending: Neurosurgery

## 2016-11-04 LAB — BASIC METABOLIC PANEL
Anion gap: 8 (ref 5–15)
BUN: 18 mg/dL (ref 6–20)
CO2: 26 mmol/L (ref 22–32)
Calcium: 8.2 mg/dL — ABNORMAL LOW (ref 8.9–10.3)
Chloride: 102 mmol/L (ref 101–111)
Creatinine, Ser: 1.09 mg/dL — ABNORMAL HIGH (ref 0.44–1.00)
GFR calc Af Amer: 57 mL/min — ABNORMAL LOW (ref >60)
GFR calc non Af Amer: 49 mL/min — ABNORMAL LOW (ref >60)
Glucose, Bld: 111 mg/dL — ABNORMAL HIGH (ref 65–99)
Potassium: 3.8 mmol/L (ref 3.5–5.1)
Sodium: 136 mmol/L (ref 135–145)

## 2016-11-04 MED ORDER — OXYCODONE HCL 5 MG PO TABS: 5.0000 mg | ORAL_TABLET | Freq: Four times a day (QID) | ORAL | 0 refills | Status: DC | PRN

## 2016-11-04 MED ORDER — ENOXAPARIN SODIUM 40 MG/0.4ML ~~LOC~~ SOLN: 40.0000 mg | SUBCUTANEOUS | Status: DC

## 2016-11-04 NOTE — Clinical Social Work Placement (Signed)
   CLINICAL SOCIAL WORK PLACEMENT  NOTE  Date:  11/04/2016  Patient Details  Name: Kerri Carter MRN: [id number] Date of Birth: 1940-03-14  Clinical Social Work is seeking post-discharge placement for this patient at the [place] level of care (*CSW will initial, date and re-position this form in  chart as items are completed):  Yes   Patient/family provided with [facility] Work Department's list of facilities offering this level of care within the geographic area requested by the patient (or if unable, by the patient's family).  Yes   Patient/family informed of their freedom to choose among providers that offer the needed level of care, that participate in Medicare, Medicaid or managed care program needed by the patient, have an available bed and are willing to accept the patient.  Yes   Patient/family informed of [facility]'s ownership interest in [facility] and [facility], as well as of the fact that they are under no obligation to receive care at these facilities.  PASRR submitted to EDS on       PASRR number received on       Existing PASRR number confirmed on 11/01/16     FL2 transmitted to all facilities in geographic area requested by pt/family on 11/01/16     FL2 transmitted to all facilities within larger geographic area on       Patient informed that his/her managed care company has contracts with or will negotiate with certain facilities, including the following:        Yes   Patient/family informed of bed offers received.  Patient chooses bed at  [facility] )     Physician recommends and patient chooses bed at      Patient to be transferred to  [facility] ) on 11/04/16.  Patient to be transferred to facility by  [facility] )     Patient family notified on 11/04/16 of transfer.  Name of family member notified:   (Patient's husband Kerri Carter is at bedside and aware of D/C today. )     PHYSICIAN    Additional Comment:    _______________________________________________ Symphanie Cederberg, Veronia Beets, LCSW 11/04/2016, 9:47 AM

## 2016-11-04 NOTE — Progress Notes (Signed)
Patient is medically stable for D/C to [facility] today. Per [facility] admissions coordinator at [facility] patient can come today to room 503. RN will call report and arrange EMS for transport. Clinical Education officer, museum (CSW) sent D/C orders to [facility] via [facility]. Patient and her husband [name] are aware of above. Husband is at bedside. Please reconsult if future social work needs arise. CSW signing off.   [employer], LCSW [phone]

## 2016-11-04 NOTE — Discharge Summary (Signed)
[facility] at [place] NAME: Kerri Carter    MR#:  [id number]  DATE OF BIRTH:  04-22-1940  DATE OF ADMISSION:  10/31/2016   ADMITTING PHYSICIAN: Fritzi Mandes, MD  DATE OF DISCHARGE: 11/04/2016  PRIMARY CARE PHYSICIAN: Coral Spikes, DO   ADMISSION DIAGNOSIS:  Closed fracture of right hip, initial encounter ([place]) [S72.001A] DISCHARGE DIAGNOSIS:  Active Problems:   Hip fracture ([place])  SECONDARY DIAGNOSIS:   Past Medical History:  Diagnosis Date  . Anxiety   . Asthma   . Chicken pox   . CKD (chronic kidney disease), stage III ([place])    a. s/p R nephrectomy.  . Conversion disorder   . COPD (chronic obstructive pulmonary disease) ([place])   . Depression   . Essential hypertension   . GERD (gastroesophageal reflux disease)   . Hyperlipidemia   . Inflammatory arthritis    a. hands/carpal tunnel.  b. Low titer rheumatoid factor. c. Negative anti-CCP antibodies. d. Plaquenil.  . Non-Obstructive CAD    a. 07/2009 Cath ([facility]): nonobs dzs;  b. 03/2011 Cath [facility]): nonobs dzs.  . Osteoarthritis    a. Knees.  . PUD (peptic ulcer disease)   . Toxic maculopathy   . Valvular heart disease    a. 07/2015 Echo: EF 55-60%, Mild AI, AS, MR, and TR.   HOSPITAL COURSE:  Kerri Tiptonis a 76 y.o. femalewith a known history of inflammatory arthritis on Arava and Forteo,COPD, hypertension, hyperlipidemia admitted after she sustained a mechanical fall while trying to get out of the car this afternoon. Patient complains of right hip pain and was found to have acute nondisplaced right femoral subcapital fracture  *Acute metabolic encephalopathy/confusion -Likely due to narcotics. Avoid if possible  * Rt Hip pain due to Acute nondisplacedright femoral neck fracture status post mechanical fall S/p surgery POD 3  Upon discharge today continue Lovenox 40mg  daily x14 days. [facility] can be removed by SNF on 11/16/16, follow-up with [facility] in 6  weeks for x-rays of the right hip.  DVT Prophylaxis - Lovenox, Foot Pumps and TED hose Weight-Bearing as tolerated to right leg DISCHARGE CONDITIONS:  stable CONSULTS OBTAINED:  Treatment Team:  Corky Mull, MD DRUG ALLERGIES:   Allergies  Allergen Reactions  . Ceftin [Cefuroxime Axetil] Anaphylaxis    Other reaction(s): Other (See Comments) REACTION: tongue and throat swell Other Reaction: TONGUE AND THROAT SWELLING REACTION: tongue and throat swell  . Lisinopril Anaphylaxis    REACTION: tongue and throat swelling (onset 10-10-09)  . Morphine Other (See Comments)    Per patient, low blood pressure issues, following morphine, that requires action to raise it back up. Drop in BP - can take small infrequent doses  . Aspirin Other (See Comments)    Sulfasalazine allergy cross reacts  . Antihistamines, Chlorpheniramine-Type     REACTION: makes pt hyper  . Antivert [Meclizine Hcl]     Other reaction(s): Unknown REACTION: bladder will not empty REACTION: bladder will not empty  . Decongestant [Pseudoephedrine Hcl]     Other reaction(s): Unknown REACTION: makes pt hyper REACTION: makes pt hyper  . Doxycycline     REACTION: GI upset  . Morphine And Related Other (See Comments)    Per patient, low blood pressure issues, following morphine, that requires action to raise it back up.  . Polymyxin B     Medication was in eye drops.  . Pseudoephedrine Hcl Other (See Comments)    REACTION: makes pt hyper  .  Sulfa Antibiotics     Face swelling  . Xarelto [Rivaroxaban]     Other reaction(s): Other (See Comments) GI bleed Stomach burning, bleeding, and tar in stool GI bleed  . Adhesive [Tape] Rash  . Iodine Hives and Rash  . Levaquin [Levofloxacin In D5w] Rash  . Tetanus Toxoid Adsorbed Other (See Comments)    REACTION: rash, fever, hot to touch at injection site  . Tetanus Toxoids     REACTION: rash, fever, hot to touch at injection site   DISCHARGE MEDICATIONS:    Allergies as of 11/04/2016      Reactions   Ceftin [cefuroxime Axetil] Anaphylaxis   Other reaction(s): Other (See Comments) REACTION: tongue and throat swell Other Reaction: TONGUE AND THROAT SWELLING REACTION: tongue and throat swell   Lisinopril Anaphylaxis   REACTION: tongue and throat swelling (onset 10-10-09)   Morphine Other (See Comments)   Per patient, low blood pressure issues, following morphine, that requires action to raise it back up. Drop in BP - can take small infrequent doses   Aspirin Other (See Comments)   Sulfasalazine allergy cross reacts   Antihistamines, Chlorpheniramine-type    REACTION: makes pt hyper   Antivert [meclizine Hcl]    Other reaction(s): Unknown REACTION: bladder will not empty REACTION: bladder will not empty   Decongestant [pseudoephedrine Hcl]    Other reaction(s): Unknown REACTION: makes pt hyper REACTION: makes pt hyper   Doxycycline    REACTION: GI upset   Morphine And Related Other (See Comments)   Per patient, low blood pressure issues, following morphine, that requires action to raise it back up.   Polymyxin B    Medication was in eye drops.   Pseudoephedrine Hcl Other (See Comments)   REACTION: makes pt hyper   Sulfa Antibiotics    Face swelling   Xarelto [rivaroxaban]    Other reaction(s): Other (See Comments) GI bleed Stomach burning, bleeding, and tar in stool GI bleed   Adhesive [tape] Rash   Iodine Hives, Rash   Levaquin [levofloxacin In D5w] Rash   Tetanus Toxoid Adsorbed Other (See Comments)   REACTION: rash, fever, hot to touch at injection site   Tetanus Toxoids    REACTION: rash, fever, hot to touch at injection site      Medication List    STOP taking these medications   traMADol 50 MG tablet Commonly known as:  ULTRAM     TAKE these medications   albuterol 108 (90 Base) MCG/ACT inhaler Commonly known as:  VENTOLIN HFA Inhale 2 puffs into the lungs every 6 (six) hours as needed.   bisacodyl 10 MG  suppository Commonly known as:  DULCOLAX Place 1 suppository (10 mg total) rectally daily as needed for moderate constipation.   BYSTOLIC 10 MG tablet Generic drug:  nebivolol take 1 tablet by mouth once daily   CALCIUM SOFT CHEWS [phone] MG-UNT-MCG Chew Generic drug:  Calcium-Vitamin D-Vitamin K Chew by mouth.   CALTRATE 600+D3 SOFT 600-800 MG-UNIT Chew Generic drug:  Calcium Carb-Cholecalciferol Chew 2 tablets by mouth daily.   docusate sodium 100 MG capsule Commonly known as:  COLACE Take 1 capsule (100 mg total) by mouth 2 (two) times daily.   enoxaparin 40 MG/0.4ML injection Commonly known as:  LOVENOX Inject 0.4 mLs (40 mg total) into the skin daily.   escitalopram 10 MG tablet Commonly known as:  LEXAPRO Take 1 tablet (10 mg total) by mouth daily.   folic acid 1 MG tablet Commonly known as:  FOLVITE Take  1 mg by mouth daily.   furosemide 20 MG tablet Commonly known as:  LASIX take 1 tablet by mouth once daily   gabapentin 300 MG capsule Commonly known as:  NEURONTIN TAKE ONE CAPSULE BY MOUTH FOUR TIMES DAILY What changed:  See the new instructions.   lamoTRIgine 100 MG tablet Commonly known as:  LAMICTAL Take 1 tablet (100 mg total) by mouth 2 (two) times daily.   leflunomide 10 MG tablet Commonly known as:  ARAVA   LORazepam 0.5 MG tablet Commonly known as:  ATIVAN Take 1 tablet (0.5 mg total) by mouth at bedtime.   lovastatin 20 MG tablet Commonly known as:  MEVACOR Take 1 tablet (20 mg total) by mouth at bedtime.   montelukast 10 MG tablet Commonly known as:  SINGULAIR take 1 tablet by mouth once daily   multivitamin-lutein Caps capsule Take 1 capsule by mouth 2 (two) times daily.   oxyCODONE 5 MG immediate release tablet Commonly known as:  Oxy IR/ROXICODONE Take 1 tablet (5 mg total) by mouth every 6 (six) hours as needed for breakthrough pain. What changed:  reasons to take this   pantoprazole 40 MG tablet Commonly known as:   PROTONIX take 1 tablet by mouth twice a day as directed   potassium chloride 10 MEQ tablet Commonly known as:  K-DUR take 1 tablet by mouth once daily   QUEtiapine 25 MG tablet Commonly known as:  SEROQUEL Take 1 tablet (25 mg total) by mouth at bedtime.   RA NASAL ALLERGY 55 MCG/ACT Aero nasal inhaler Generic drug:  triamcinolone instill 2 sprays into each nostril once daily   ranitidine 150 MG capsule Commonly known as:  ZANTAC take 1 capsule by mouth twice a day   sucralfate 1 g tablet Commonly known as:  CARAFATE Take 1 tablet (1 g total) by mouth 4 (four) times daily -  with meals and at bedtime. What changed:  when to take this  reasons to take this   Teriparatide (Recombinant) 600 MCG/2.4ML Soln Commonly known as:  FORTEO Inject 0.08 mLs (20 mcg total) into the skin daily. What changed:  how much to take        DISCHARGE INSTRUCTIONS:  Upon discharge today continue Lovenox 40mg  daily x14 days. [facility] can be removed by SNF on 11/16/16, follow-up with [facility] in 6 weeks for x-rays of the right hip.  DVT Prophylaxis - Lovenox, Foot Pumps and TED hose Weight-Bearing as tolerated to right leg DIET:  Regular diet DISCHARGE CONDITION:  Good ACTIVITY:  Activity as tolerated OXYGEN:  Home Oxygen: Yes.    Oxygen Delivery: 2 liters/min via Patient connected to nasal cannula oxygen DISCHARGE LOCATION:  nursing home   If you experience worsening of your admission symptoms, develop shortness of breath, life threatening emergency, suicidal or homicidal thoughts you must seek medical attention immediately by calling 911 or calling your MD immediately  if symptoms less severe.  You Must read complete instructions/literature along with all the possible adverse reactions/side effects for all the Medicines you take and that have been prescribed to you. Take any new Medicines after you have completely understood and accpet all the possible adverse  reactions/side effects.   Please note  You were cared for by a hospitalist during your hospital stay. If you have any questions about your discharge medications or the care you received while you were in the hospital after you are discharged, you can call the unit and asked to speak with the hospitalist on  call if the hospitalist that took care of you is not available. Once you are discharged, your primary care physician will handle any further medical issues. Please note that NO REFILLS for any discharge medications will be authorized once you are discharged, as it is imperative that you return to your primary care physician (or establish a relationship with a primary care physician if you do not have one) for your aftercare needs so that they can reassess your need for medications and monitor your lab values.    On the day of Discharge:  VITAL SIGNS:  Blood pressure (!) 147/47, pulse 78, temperature 98.5 F (36.9 C), temperature source Oral, resp. rate 16, height 5\' 4"  (1.626 m), weight 72.6 kg (160 lb), SpO2 97 %. PHYSICAL EXAMINATION:  GENERAL:  76 y.o.-year-old patient lying in the bed with no acute distress.  EYES: Pupils equal, round, reactive to light and accommodation. No scleral icterus. Extraocular muscles intact.  HEENT: Head atraumatic, normocephalic. Oropharynx and nasopharynx clear.  NECK:  Supple, no jugular venous distention. No thyroid enlargement, no tenderness.  LUNGS: Normal breath sounds bilaterally, no wheezing, rales,rhonchi or crepitation. No use of accessory muscles of respiration.  CARDIOVASCULAR: S1, S2 normal. No murmurs, rubs, or gallops.  ABDOMEN: Soft, non-tender, non-distended. Bowel sounds present. No organomegaly or mass.  EXTREMITIES: No pedal edema, cyanosis, or clubbing.  NEUROLOGIC: Cranial nerves II through XII are intact. Muscle strength 5/5 in all extremities. Sensation intact. Gait not checked.  PSYCHIATRIC: The patient is alert and oriented x 3.    SKIN: No obvious rash, lesion, or ulcer.  DATA REVIEW:   CBC  Recent Labs Lab 11/03/16 0355  WBC 11.2*  HGB 11.0*  HCT 33.3*  PLT 189    Chemistries   Recent Labs Lab 11/04/16 0414  NA 136  K 3.8  CL 102  CO2 26  GLUCOSE 111*  BUN 18  CREATININE 1.09*  CALCIUM 8.2*     Contact information for follow-up providers    Leone Haven, MD Follow up.   Specialty:  Family Medicine Why:  Office will call back w/ Appt (NOTE:  Dr. Thersa Salt no longer w/ practice) Contact information: [place] [address] [phone]        Duanne Guess, PA-C Follow up on 12/14/2016.   Specialties:  Orthopedic Surgery, Emergency Medicine Why:  X-rays of the right hip.;  @ 9:00 am Contact information: [place] [address] [phone]            Contact information for after-discharge care    [facility] Follow up.   Specialty:  [place] information: [place] [phone]                  Management plans discussed with the patient, family and they are in agreement.  CODE STATUS: Full Code   TOTAL TIME TAKING CARE OF THIS PATIENT: 45 minutes.    Max Sane M.D on 11/04/2016 at 9:35 AM  Between 7am to 6pm - Pager - [phone]  After 6pm go to [email or web address] - Proofreader  [facility]  Hospitalists  Office  [phone]  CC: Primary care physician; Coral Spikes, DO   Note: This dictation was prepared with Dragon dictation along with smaller phrase technology. Any transcriptional errors that result from this process are unintentional.

## 2016-11-04 NOTE — Telephone Encounter (Signed)
Patient is discharging today to [place] will continue to follow for DC from [facility].

## 2016-11-04 NOTE — Progress Notes (Signed)
Called report to Levada Dy, LPN at [facility], answered all questions. EMS called for transport

## 2016-11-04 NOTE — Progress Notes (Signed)
Subjective: 3 Days Post-Op Procedure(s) (LRB): ARTHROPLASTY BIPOLAR HIP (HEMIARTHROPLASTY) (Right) Patient reports pain as severe.  Pain in right lateral hip. Plan is for discharge to SNF today pending medical clearance. Negative for chest pain and shortness of breath Afebrile. Gastrointestinal:Negative for nausea and vomiting   Objective: Vital signs in last 24 hours: Temp:  [98.5 F (36.9 C)-99 F (37.2 C)] 98.5 F (36.9 C) (10/03 0747) Pulse Rate:  [78-84] 78 (10/03 0747) Resp:  [16] 16 (10/02 1945) BP: (126-147)/(36-56) 147/47 (10/03 0747) SpO2:  [91 %-100 %] 97 % (10/03 0747)  Intake/Output from previous day:  Intake/Output Summary (Last 24 hours) at 11/04/16 0808 Last data filed at 11/04/16 0500  Gross per 24 hour  Intake              720 ml  Output              800 ml  Net              -80 ml    Intake/Output this shift: No intake/output data recorded.  Labs:  Recent Labs  11/02/16 0321 11/03/16 0355  HGB 10.4* 11.0*    Recent Labs  11/02/16 0321 11/03/16 0355  WBC 12.1* 11.2*  RBC 3.72* 4.02  HCT 31.1* 33.3*  PLT 175 189    Recent Labs  11/03/16 0355 11/04/16 0414  NA 135 136  K 4.1 3.8  CL 100* 102  CO2 28 26  BUN 13 18  CREATININE 1.06* 1.09*  GLUCOSE 107* 111*  CALCIUM 8.2* 8.2*   No results for input(s): LABPT, INR in the last 72 hours.   EXAM General - Patient is Alert, Appropriate and Oriented. Appears comfortable. Does not appear to be in severe pain. Extremity - ABD soft Sensation intact distally Intact pulses distally Dorsiflexion/Plantar flexion intact Incision: moderate drainage No cellulitis present Dressing/Incision - Bloody drainage.Dressing changed this am Motor Function - intact, moving foot and toes well on exam.    Past Medical History:  Diagnosis Date  . Anxiety   . Asthma   . Chicken pox   . CKD (chronic kidney disease), stage III ([place])    a. s/p R nephrectomy.  . Conversion disorder   . COPD (chronic  obstructive pulmonary disease) ([place])   . Depression   . Essential hypertension   . GERD (gastroesophageal reflux disease)   . Hyperlipidemia   . Inflammatory arthritis    a. hands/carpal tunnel.  b. Low titer rheumatoid factor. c. Negative anti-CCP antibodies. d. Plaquenil.  . Non-Obstructive CAD    a. 07/2009 Cath ([facility]): nonobs dzs;  b. 03/2011 Cath [facility]): nonobs dzs.  . Osteoarthritis    a. Knees.  . PUD (peptic ulcer disease)   . Toxic maculopathy   . Valvular heart disease    a. 07/2015 Echo: EF 55-60%, Mild AI, AS, MR, and TR.    Assessment/Plan: 3 Days Post-Op Procedure(s) (LRB): ARTHROPLASTY BIPOLAR HIP (HEMIARTHROPLASTY) (Right) Active Problems:   Hip fracture ([facility])  Estimated body mass index is 27.46 kg/m as calculated from the following:   Height as of this encounter: 5\' 4"  (1.626 m).   Weight as of this encounter: 72.6 kg (160 lb). Advance diet Up with therapy  Labs and vital signs are stable this am.  No confusion Possible discharge today pending medical clearance  Upon discharge today continue Lovenox 40mg  daily x14 days. [facility] can be removed by SNF on 11/16/16, follow-up with [facility] in 6 weeks for x-rays of the right hip.  DVT Prophylaxis - Lovenox, Foot Pumps and TED hose Weight-Bearing as tolerated to right leg  Duanne Guess PA-C [facility] 11/04/2016, 8:08 AM

## 2016-11-04 NOTE — Progress Notes (Signed)
Physical Therapy Treatment Patient Details Name: Kerri Carter MRN: [id number] DOB: [birth year] Today's Date: 11/04/2016    History of Present Illness Pt is a 76 y/o F who presented s/p fall and was found to have nondisplaced R femoral subcapital fracture.  Pt is now s/p R hip bipolar hemiarthroplasty.  Pt's PMH includes conversion disorder, COPD, L THA, pt reported L-4 fusion in 09/2016.     PT Comments    Kerri Carter was alert and oriented x4 today and was agreeable to work with therapy.  She demonstrated significant improvements with mobility compared to yesterday but continues to require mod assist with bed mobility, mod assist with transfers, and min assist to ambulate 15 ft using RW. SNF remains appropriate d/c plan.    Follow Up Recommendations  SNF     Equipment Recommendations  Rolling walker with 5" wheels    Recommendations for Other Services       Precautions / Restrictions Precautions Precautions: Fall Required Braces or Orthoses: Spinal Brace Spinal Brace: Lumbar corset;Applied in sitting position (pt reports she is to wear this when OOB) Restrictions Weight Bearing Restrictions: Yes RLE Weight Bearing: Weight bearing as tolerated    Mobility  Bed Mobility Overal bed mobility: Needs Assistance Bed Mobility: Supine to Sit;Sit to Supine     Supine to sit: HOB elevated;Mod assist Sit to supine: Mod assist   General bed mobility comments: Assist to elevate trunk and use of bed pad for positioning sitting EOB.  To return to supine pt requires assist bringing BLEs into bed.   Transfers Overall transfer level: Needs assistance Equipment used: Rolling walker (2 wheeled) Transfers: Sit to/from Stand Sit to Stand: Mod assist;Min assist         General transfer comment: Cues for hand placement and safe technique.  Mod assist to boost to standing from bed, min assist to boost to standing from [facility].   Ambulation/Gait Ambulation/Gait assistance: Min  assist Ambulation Distance (Feet): 15 Feet Assistive device: Rolling walker (2 wheeled) Gait Pattern/deviations: Shuffle;Decreased weight shift to right;Decreased stance time - right;Decreased step length - left;Antalgic;Trunk flexed;Step-to pattern Gait velocity: decreased Gait velocity interpretation: <1.8 ft/sec, indicative of risk for recurrent falls General Gait Details: Pt initially shuffling her feet but with cues pt able to demonstrate foot clearance Bil.  Cues for sequencing using RW, min assist at times to navigate with RW in room.    Stairs            Wheelchair Mobility    Modified Rankin (Stroke Patients Only)       Balance Overall balance assessment: Needs assistance Sitting-balance support: Feet supported;Bilateral upper extremity supported Sitting balance-Leahy Scale: Fair Sitting balance - Comments: Pt able to sit EOB without UE support but would likely lose her balance with perturbation   Standing balance support: Bilateral upper extremity supported;During functional activity Standing balance-Leahy Scale: Poor Standing balance comment: Pt relies on UE support for static and dynamic activities                            Cognition Arousal/Alertness: Awake/alert;Lethargic Behavior During Therapy: Anxious;WFL for tasks assessed/performed (anxious about pain) Overall Cognitive Status: Within Functional Limits for tasks assessed                                        Exercises Total Joint Exercises Ankle Circles/Pumps: AROM;Both;10  reps;Supine Quad Sets: Strengthening;Both;10 reps;Supine Heel Slides: AAROM;Right;10 reps;Supine Hip ABduction/ADduction: AAROM;Right;10 reps;Supine Straight Leg Raises: AAROM;Right;10 reps;Supine    General Comments        Pertinent Vitals/Pain Pain Assessment: Faces Faces Pain Scale: Hurts even more Pain Location: R hip (denies any back pain) Pain Descriptors / Indicators:  Aching;Discomfort;Grimacing;Guarding;Moaning;Other (Comment) (shouting) Pain Intervention(s): Limited activity within patient's tolerance;Monitored during session    Home Living                      Prior Function            PT Goals (current goals can now be found in the care plan section) Acute Rehab PT Goals Patient Stated Goal: decreased pain PT Goal Formulation: With patient Time For Goal Achievement: 11/16/16 Potential to Achieve Goals: Fair Progress towards PT goals: Progressing toward goals    Frequency    BID      PT Plan Current plan remains appropriate    Co-evaluation              AM-PAC PT "6 Clicks" Daily Activity  Outcome Measure  Difficulty turning over in bed (including adjusting bedclothes, sheets and blankets)?: Unable Difficulty moving from lying on back to sitting on the side of the bed? : Unable Difficulty sitting down on and standing up from a chair with arms (e.g., wheelchair, bedside commode, etc,.)?: Unable Help needed moving to and from a bed to chair (including a wheelchair)?: A Lot Help needed walking in hospital room?: A Little Help needed climbing 3-5 steps with a railing? : Total 6 Click Score: 9    End of Session Equipment Utilized During Treatment: Gait belt;Back brace Activity Tolerance: Patient limited by pain;Patient limited by fatigue Patient left: in bed;with call bell/phone within reach;with bed alarm set;with SCD's reapplied;with family/visitor present Nurse Communication: Mobility status;Other (comment) (pt with raw skin in thoracic region on back) PT Visit Diagnosis: Pain;Unsteadiness on feet (R26.81);Muscle weakness (generalized) (M62.81);Difficulty in walking, not elsewhere classified (R26.2) Pain - Right/Left: Right Pain - part of body: Hip     Time: 9233-0076 PT Time Calculation (min) (ACUTE ONLY): 32 min  Charges:  $Therapeutic Exercise: 8-22 mins $Therapeutic Activity: 8-22 mins                     G Codes:       Collie Siad PT, DPT 11/04/2016, 1:09 PM

## 2016-11-04 NOTE — Care Management Important Message (Signed)
Important Message  Patient Details  Name: MEIKO IVES MRN: [id number] Date of Birth: [birth year]   Medicare Important Message Given:  Yes    Jolly Mango, RN 11/04/2016, 8:58 AM

## 2016-11-05 DIAGNOSIS — M810 Age-related osteoporosis without current pathological fracture: Secondary | ICD-10-CM | POA: Insufficient documentation

## 2016-11-21 ENCOUNTER — Other Ambulatory Visit: Payer: Self-pay | Admitting: Gastroenterology

## 2016-11-21 DIAGNOSIS — R1031 Right lower quadrant pain: Secondary | ICD-10-CM

## 2016-11-23 IMAGING — DX DG CHEST 2V
2 series · 2 of 2 positions shown · non-contrast
Comparison: 09/26/2015

CLINICAL DATA: Cough, hypertension and diabetes.  COPD.

EXAM:
CHEST  2 VIEW

[chest pa]
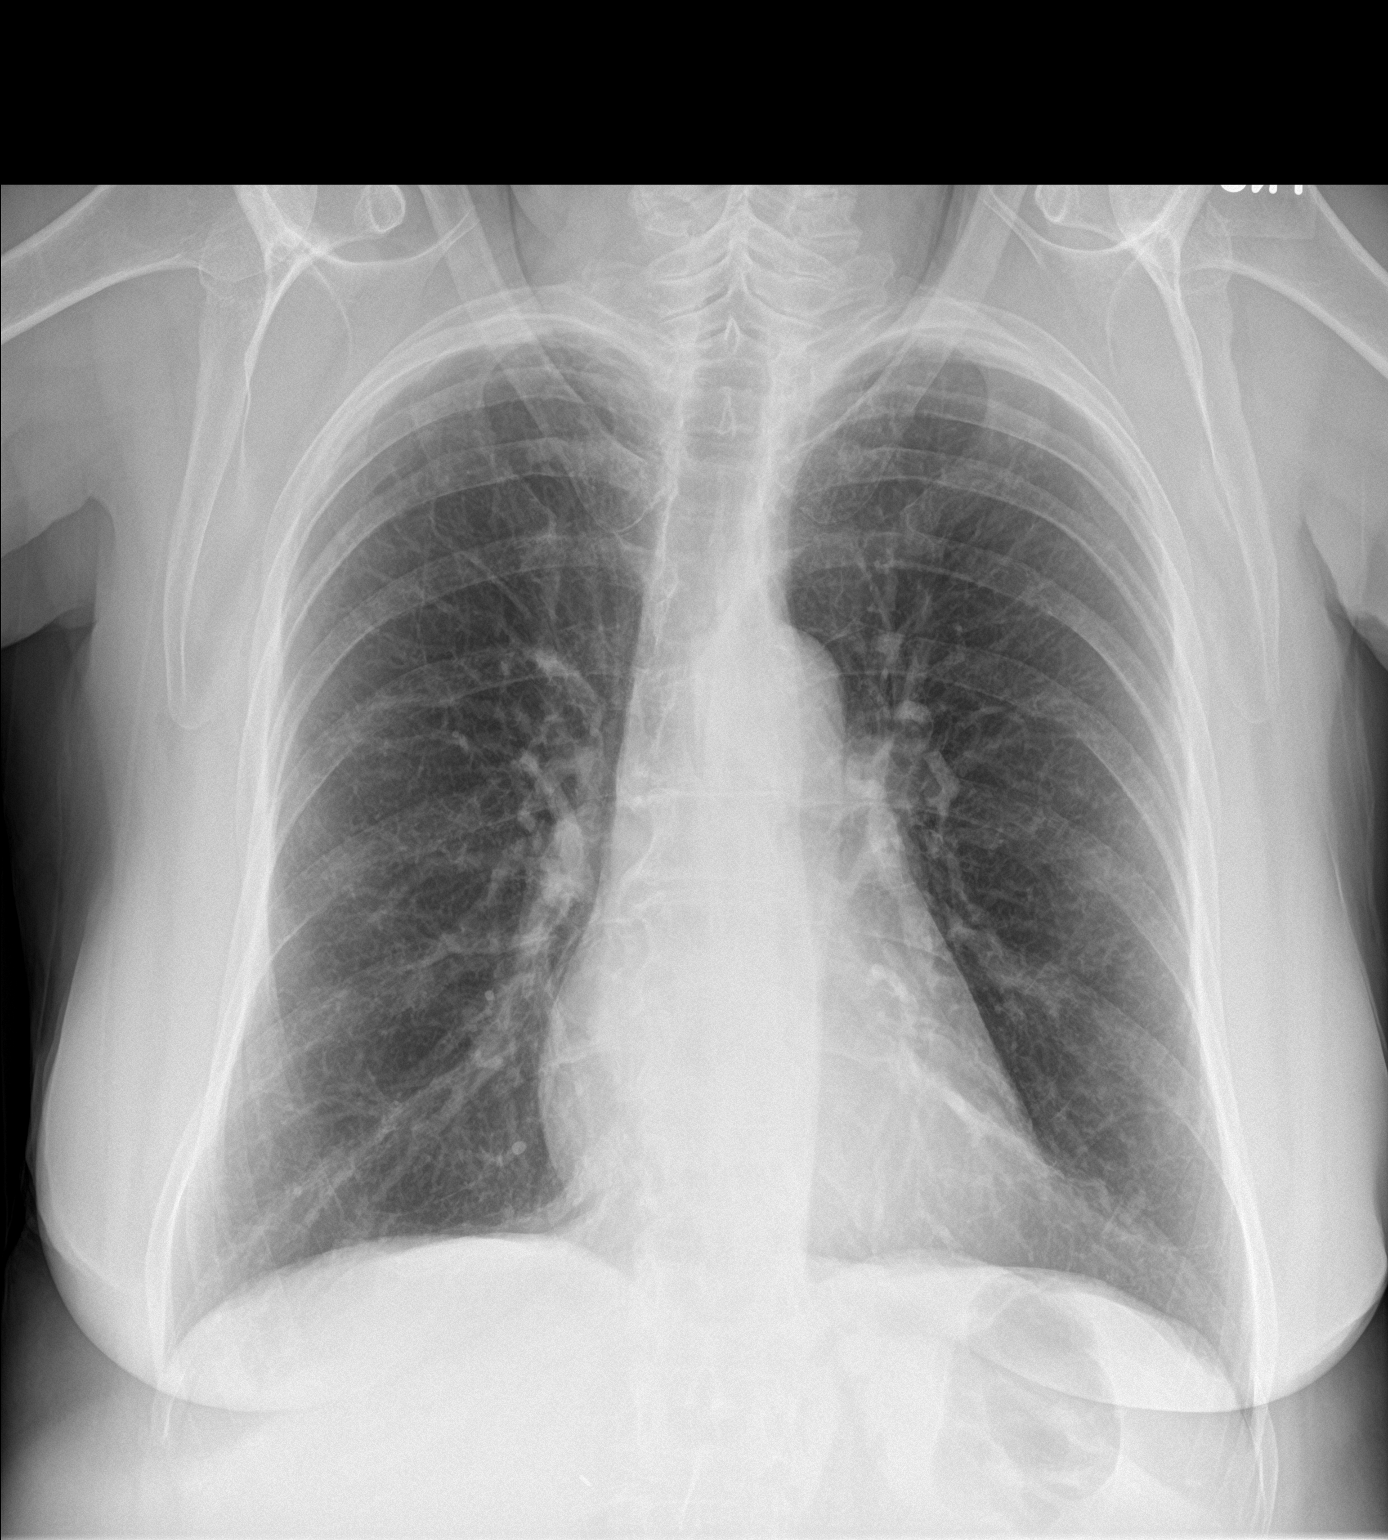

[chest lat]
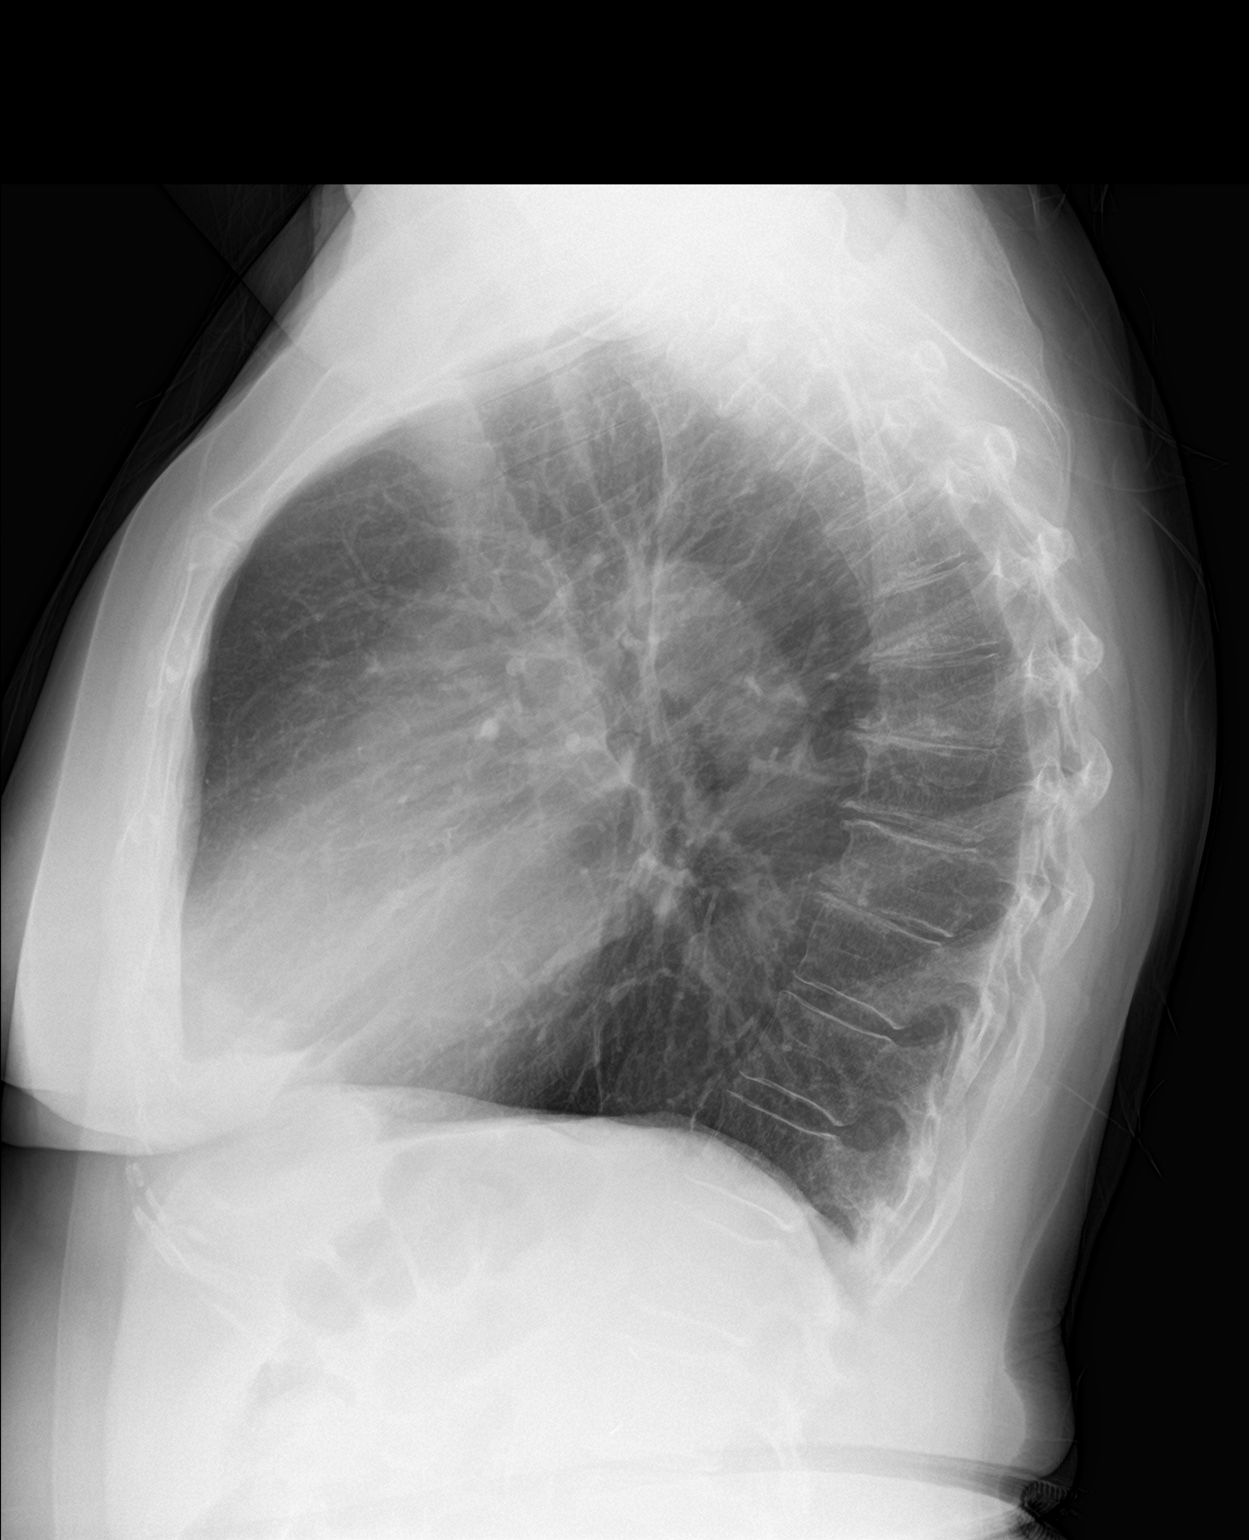

[2 of 2 positions shown; findings below may reference images not displayed]

FINDINGS: The heart size and mediastinal contours are within normal limits.
Both lungs are slightly hyperinflated without pneumonic
consolidation, CHF, effusion or pneumothorax. Stable mid thoracic
mild kyphosis attributable to multilevel degenerative disc disease.
No acute osseous appearing abnormality.
IMPRESSION: Mild hyperinflation of the lungs without acute pulmonary disease.

## 2016-11-24 IMAGING — MG MM DIGITAL SCREENING BILAT W/ TOMO W/ CAD
9 of 13 series · 9 of 29 positions shown · non-contrast
Comparison: Previous exam(s).

CLINICAL DATA: Screening.

EXAM:
2D DIGITAL SCREENING BILATERAL MAMMOGRAM WITH CAD AND ADJUNCT TOMO

[L MLO (1 of 2)]
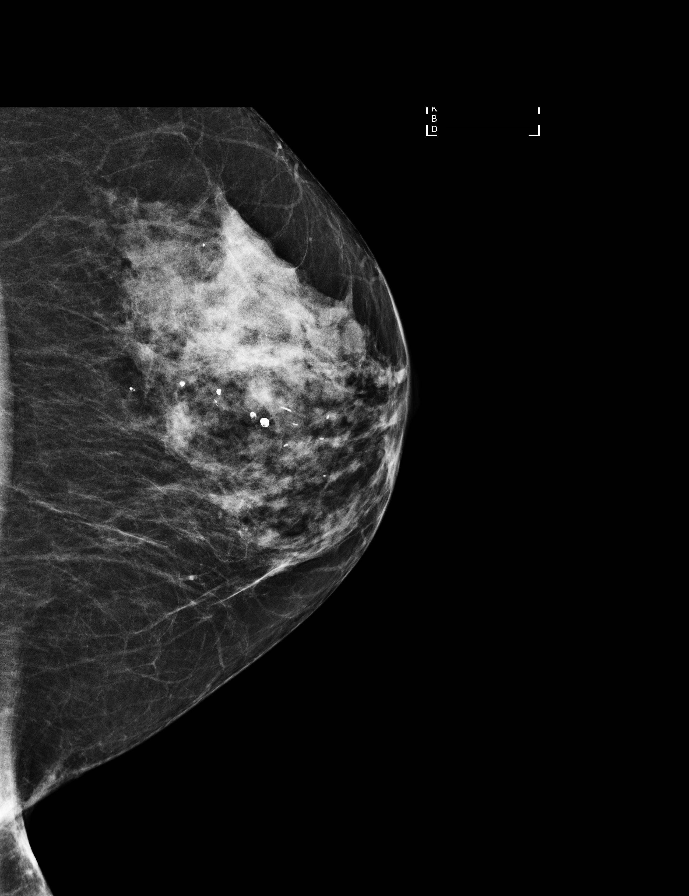

[R MLO (1 of 2)]
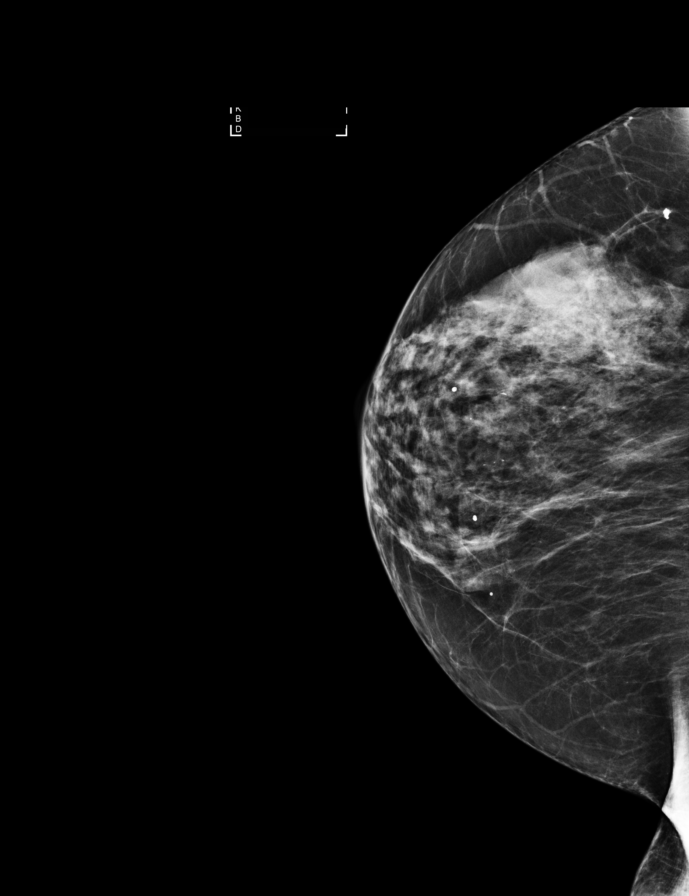

[L CC synth-2D]
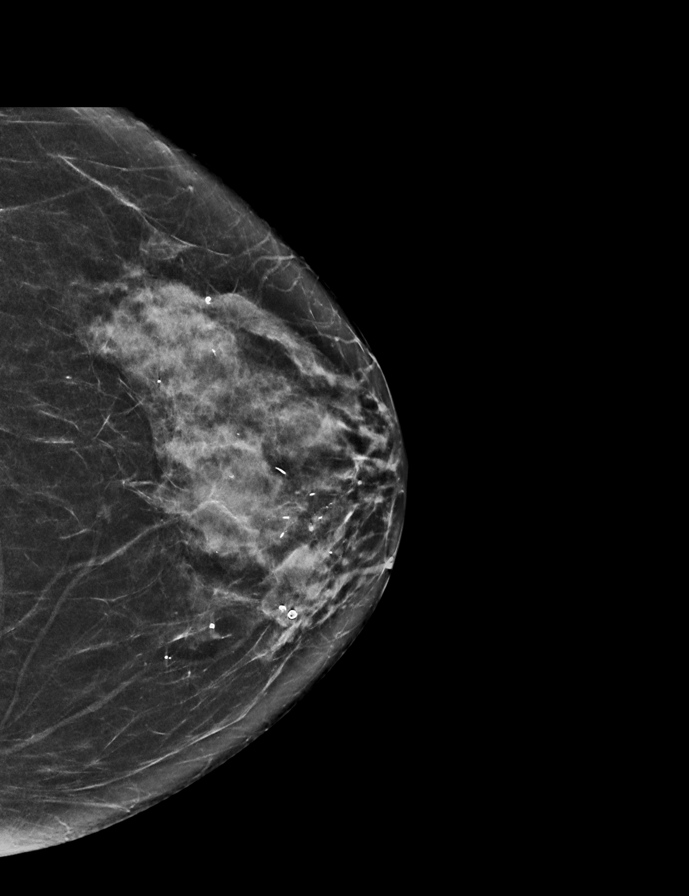

[R MLO (2 of 2)]
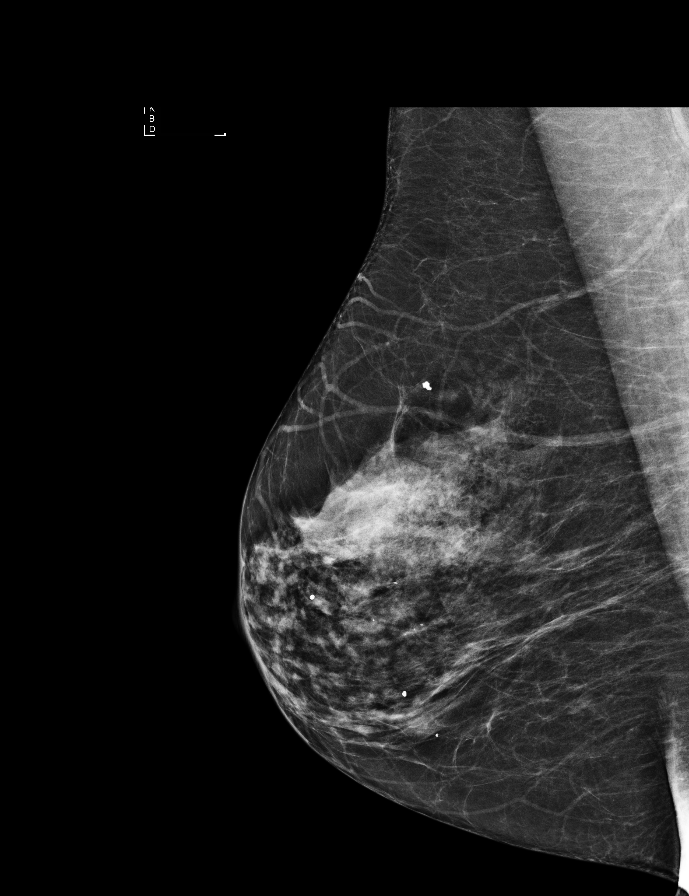

[R MLO synth-2D]
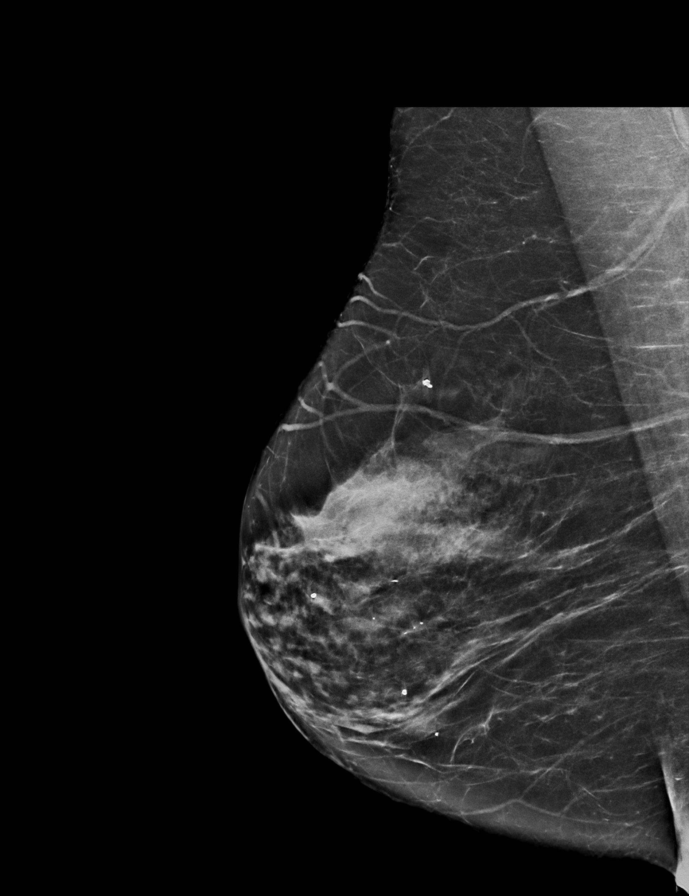

[L MLO synth-2D]
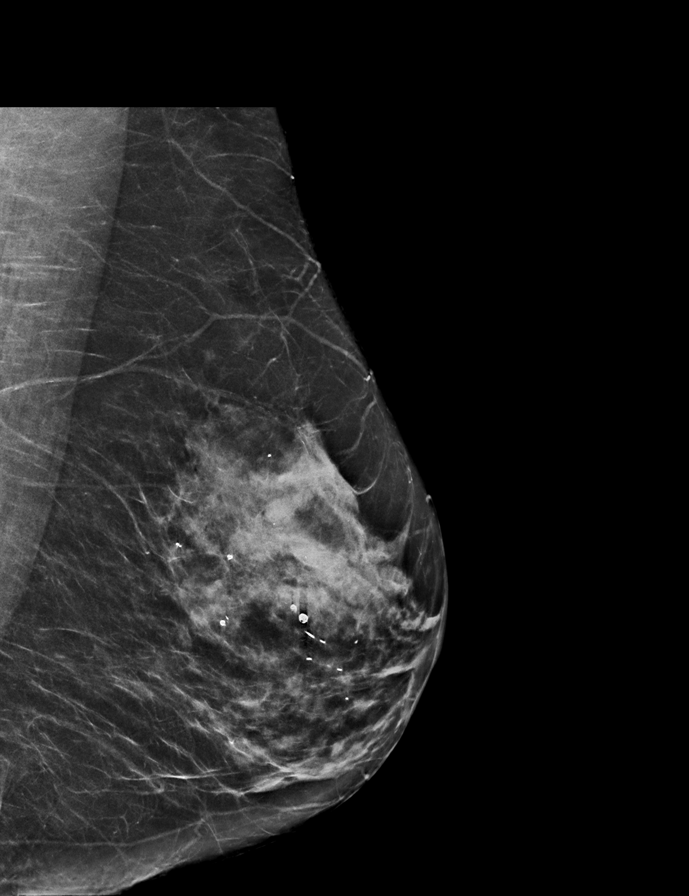

[R CC synth-2D]
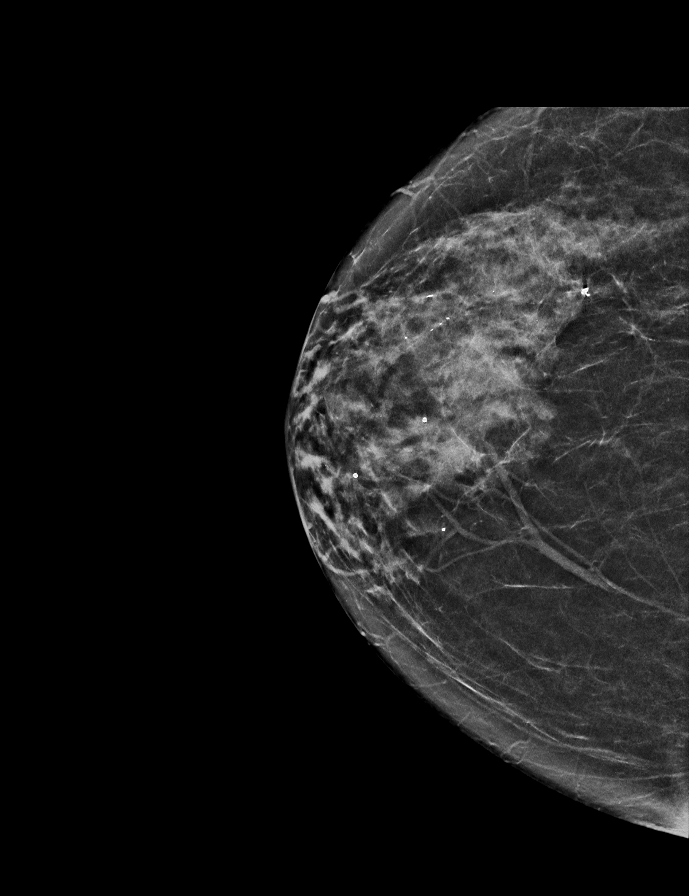

[L MLO (2 of 2)]
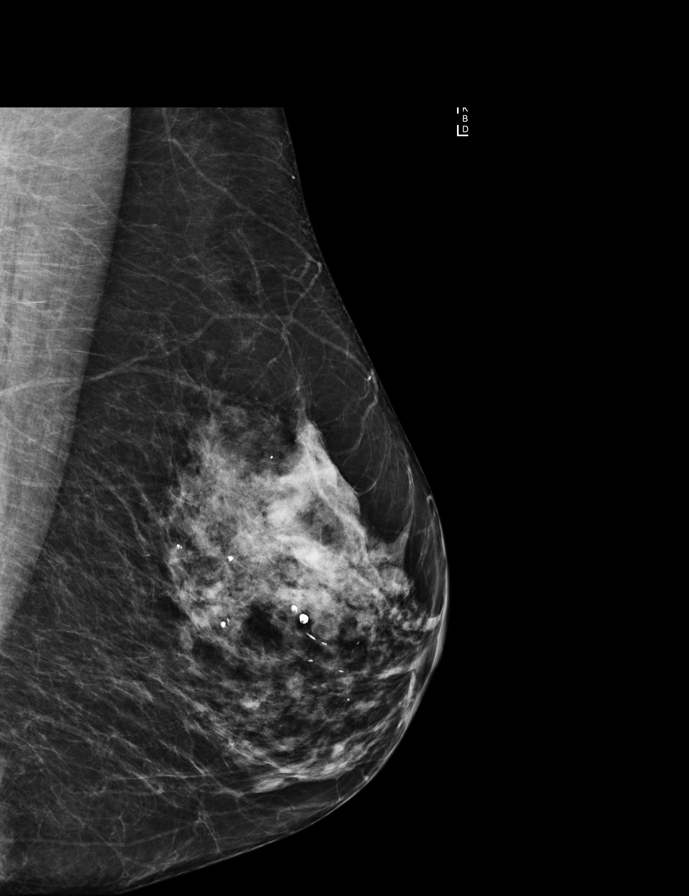

[R CC]
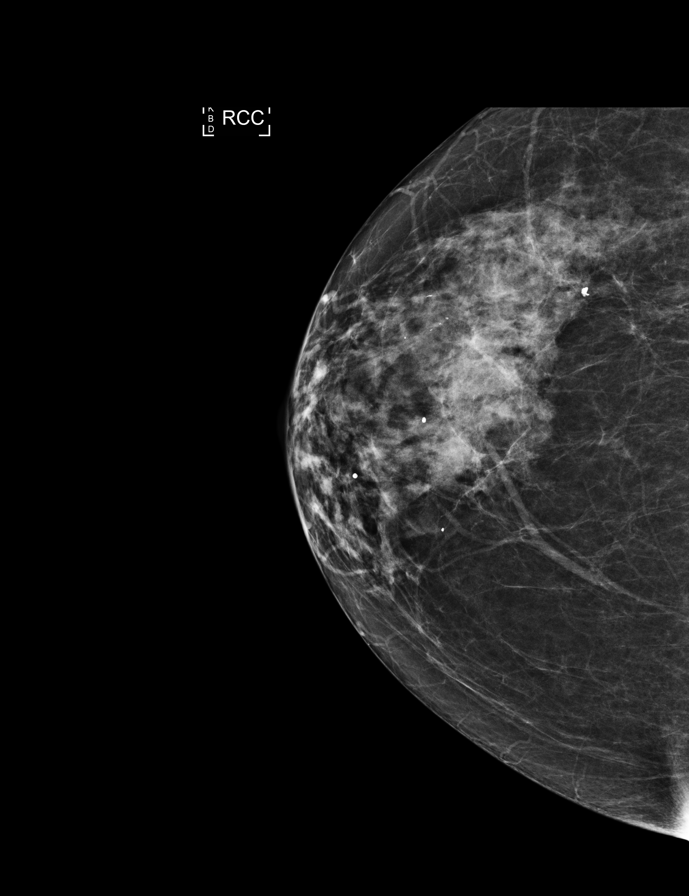

[9 of 29 positions shown; findings below may reference images not displayed]

ACR Breast Density Category c: The breast tissue is heterogeneously
dense, which may obscure small masses.
FINDINGS: In the right breast a possible mass requires further evaluation.

In the left breast a possible mass requires further evaluation.

Images were processed with CAD.
IMPRESSION: Further evaluation is suggested for possible mass in the right
breast.

Further evaluation is suggested for possible mass in the left
breast.

RECOMMENDATION:
Diagnostic mammogram and possibly ultrasound of both breasts.
(Code:[id number])

The patient will be contacted regarding the findings, and additional
imaging will be scheduled.

BI-RADS CATEGORY  0: Incomplete. Need additional imaging evaluation
and/or prior mammograms for comparison.

## 2016-11-27 ENCOUNTER — Other Ambulatory Visit: Payer: Self-pay | Admitting: Family Medicine

## 2016-12-01 ENCOUNTER — Telehealth: Payer: Self-pay | Admitting: Family Medicine

## 2016-12-01 MED ORDER — PANTOPRAZOLE SODIUM 40 MG PO TBEC: DELAYED_RELEASE_TABLET | ORAL | 0 refills | Status: DC

## 2016-12-01 NOTE — Telephone Encounter (Signed)
I am happy to refill the Protonix though she needs to set up a follow-up appointment with a provider in this office. I cannot see where she has been on Xanax chronically and thus will not refill this medication. Please see if she has been taking this medication chronically. It appears that she may have been referred to a psychiatrist and if that is the case and she feels she needs Xanax she should discuss with them.

## 2016-12-01 NOTE — Telephone Encounter (Signed)
Pt needs a refill for pantoprazole (PROTONIX) 40 MG tablet, Xanax 0.5mg .   Pharmacy is [facility], [place] - [address] AT [address] [place]  Call pt @ [phone]. Thank you!

## 2016-12-01 NOTE — Telephone Encounter (Signed)
Last office 07/17/16, to follow up 3 months Nov Wellness visit

## 2016-12-02 NOTE — Telephone Encounter (Signed)
Left voice mail to call back 

## 2016-12-03 ENCOUNTER — Telehealth: Payer: Self-pay | Admitting: Family Medicine

## 2016-12-03 NOTE — Telephone Encounter (Signed)
Verbal orders given for bilateral hip replacements , Pt and skilled nursing spoke with Bea.

## 2016-12-03 NOTE — Telephone Encounter (Signed)
Kerri Carter [phone] called from [facility] regarding pt being recert for skilled nursing and PT.  Thank you!

## 2016-12-03 NOTE — Telephone Encounter (Signed)
Please confirm that this is for her bilateral hip replacements. If not please confirm for what diagnosis and then you may give verbal orders.

## 2016-12-08 ENCOUNTER — Telehealth: Payer: Self-pay

## 2016-12-08 DIAGNOSIS — F411 Generalized anxiety disorder: Secondary | ICD-10-CM

## 2016-12-08 MED ORDER — LORAZEPAM 0.5 MG PO TABS: 0.5000 mg | ORAL_TABLET | Freq: Every day | ORAL | 0 refills | Status: DC

## 2016-12-08 NOTE — Telephone Encounter (Signed)
received a fax requesting a refill on the lorazepam.5mg  pt has appt on  12-21-16 last seen on 07-29-16.

## 2016-12-08 NOTE — Telephone Encounter (Signed)
Received request for Ativan 0.5 mg po qhs prn for patient to last until 12/21/2016 - her next appointment.  Will refill for 15 days .reviewed [place] controlled substance database.  Script printed and given to [place].

## 2016-12-08 NOTE — Telephone Encounter (Signed)
Faxed and confirmed rx ativan .5mg  icd # [id number] order # [id number] #15 with no refills.

## 2016-12-08 NOTE — Telephone Encounter (Signed)
Patient advised of below and verbalized understanding.   She states refill request for xanax was sent here by mistake should have went to psychiatrist .   Scheduled to establish with new PCP.

## 2016-12-08 NOTE — Telephone Encounter (Signed)
Pt.notified

## 2016-12-08 NOTE — Telephone Encounter (Signed)
fax and confirmed rx for ativan .5mg  id # [id number] order # [id number] #15 with no refills

## 2016-12-11 IMAGING — MG MM DIGITAL DIAGNOSTIC BILAT W/ TOMO W/ CAD
8 of 15 series · 8 of 35 positions shown · non-contrast
Comparison: Previous exam(s).

CLINICAL DATA: Screening recall for possible bilateral breast
masses.

EXAM:
2D DIGITAL DIAGNOSTIC BILATERAL MAMMOGRAM WITH CAD AND ADJUNCT TOMO
BILATERAL BREAST ULTRASOUND

[L MLO synth-2D (1 of 2)]
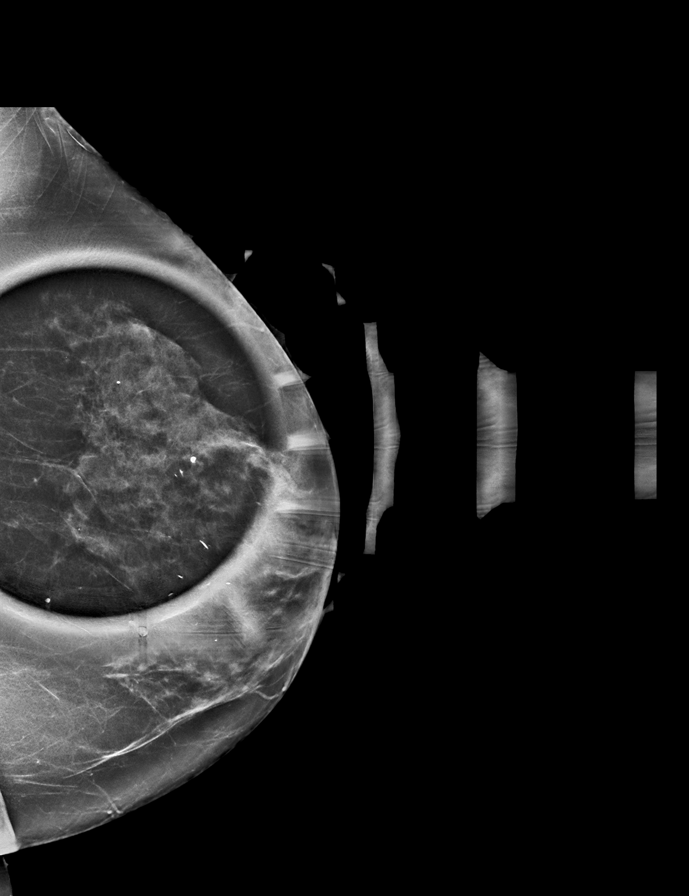

[L ML synth-2D]
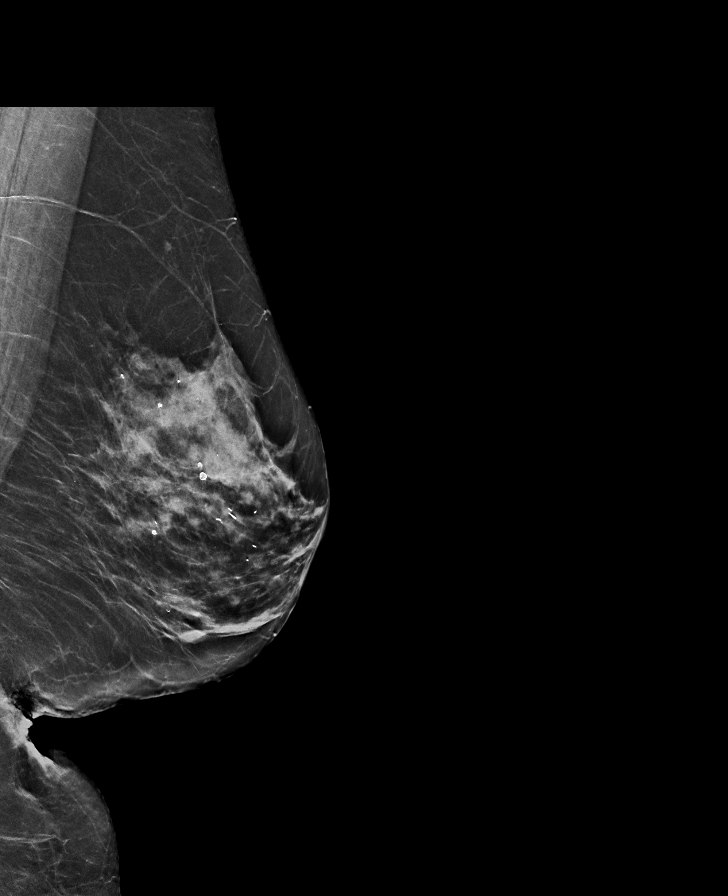

[R CC synth-2D]
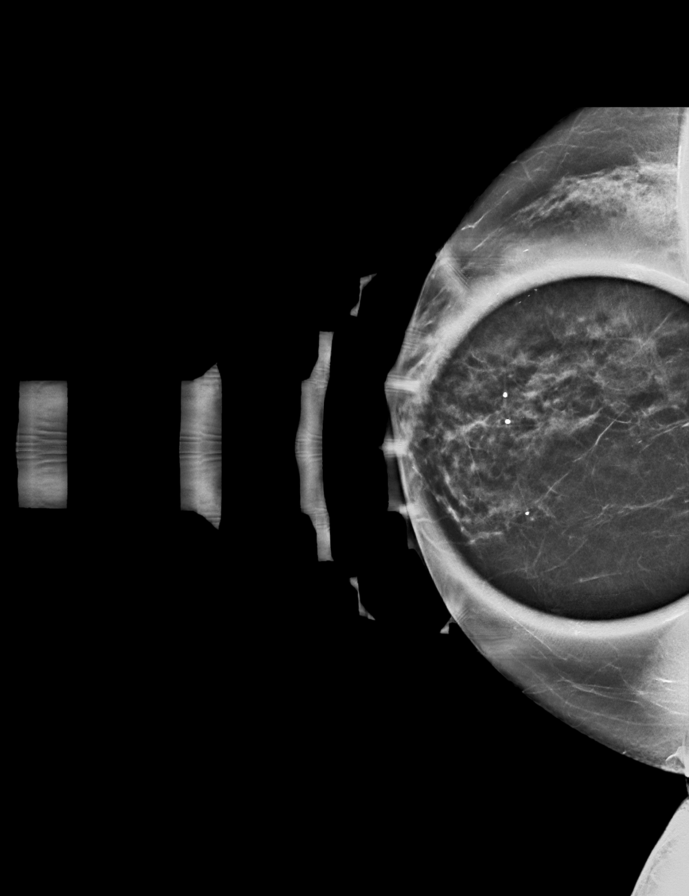

[R MLO synth-2D]
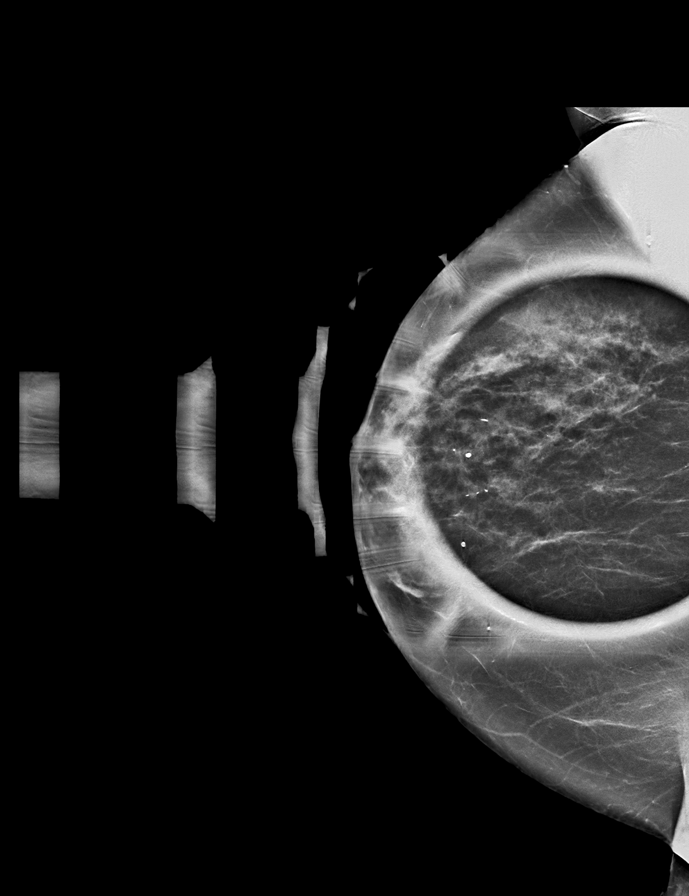

[L MLO synth-2D (2 of 2)]
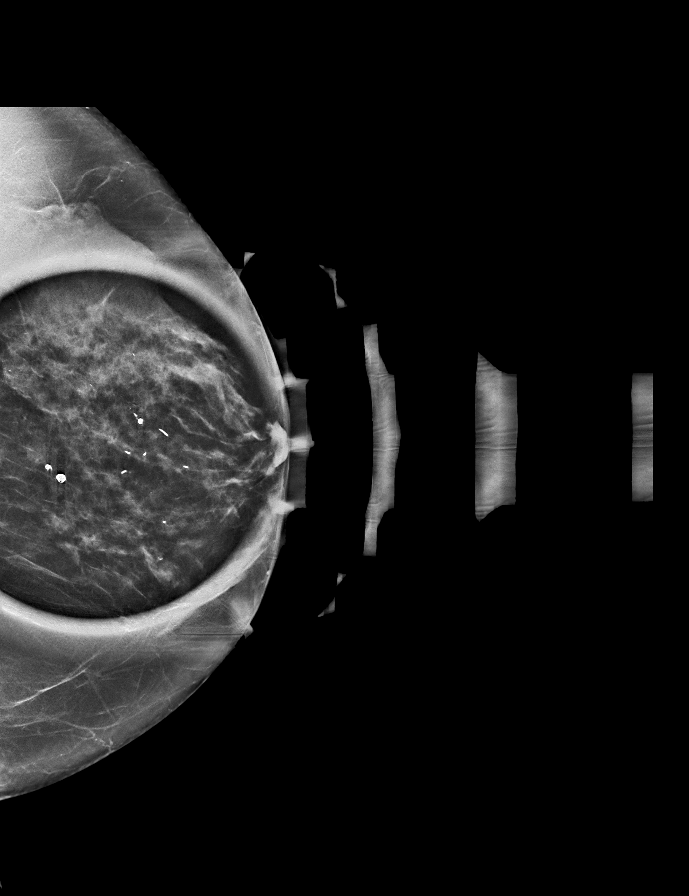

[L MLO (1 of 2)]
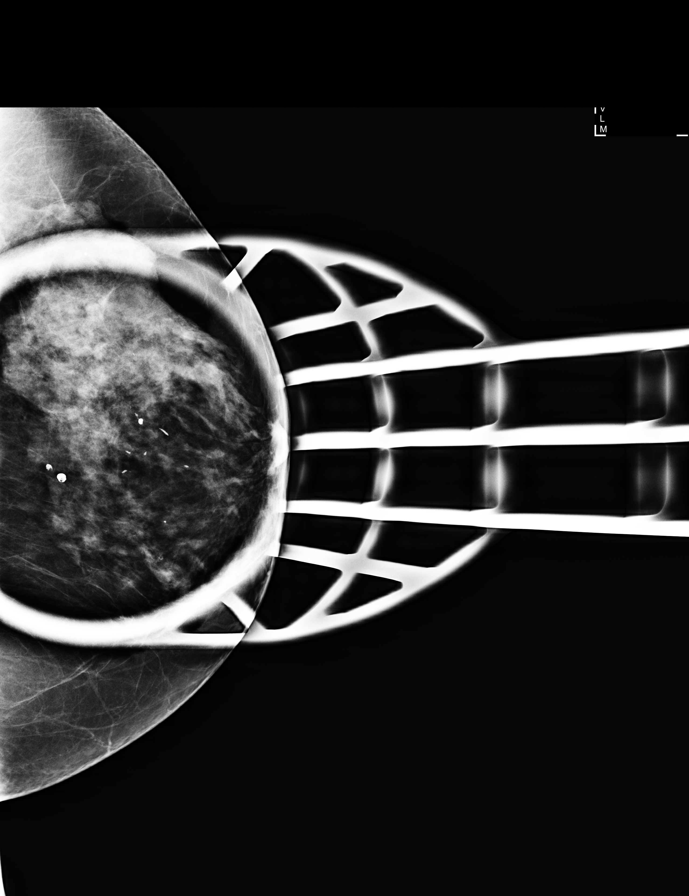

[L MLO (2 of 2)]
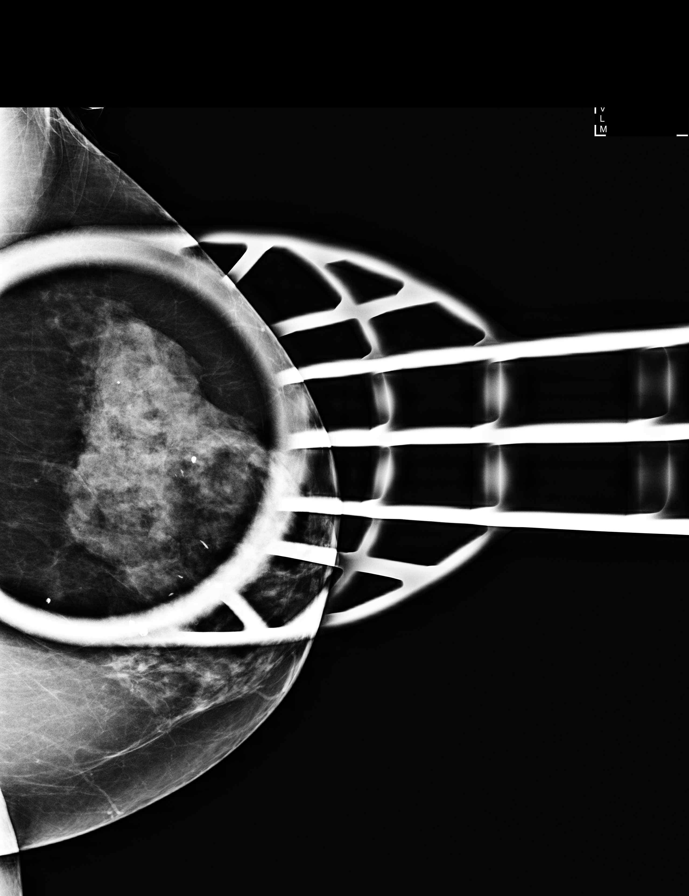

[L ML]
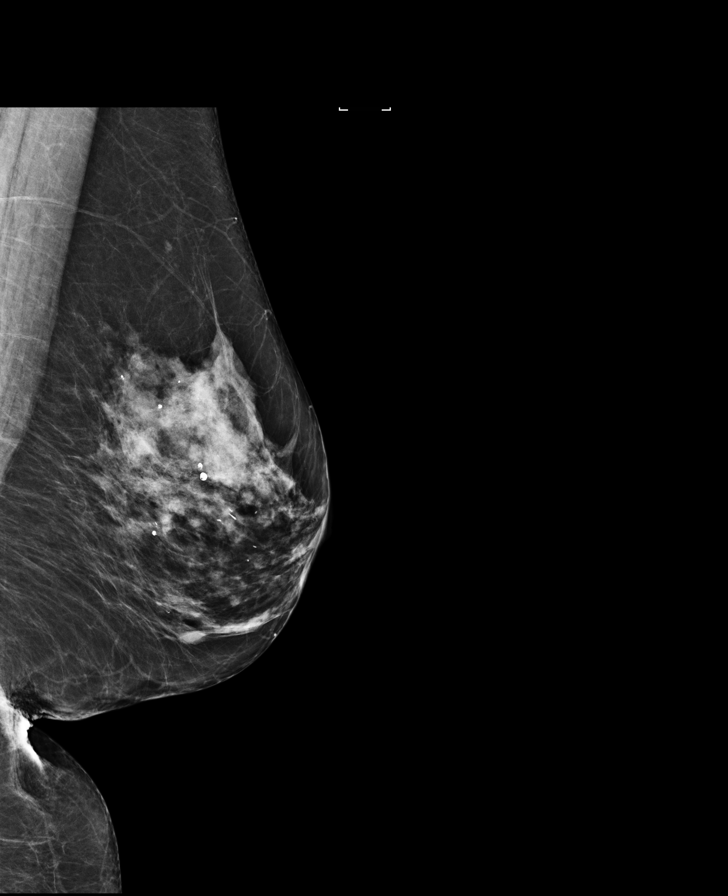

[8 of 35 positions shown; findings below may reference images not displayed]

ACR Breast Density Category c: The breast tissue is heterogeneously
dense, which may obscure small masses.
FINDINGS: Spot compression CC and MLO tomograms were performed of the
bilateral breasts. The initially questioned possible masses in the
bilateral breasts are not visualized and felt to be related to
overlapping fibroglandular tissue although a small mass could be
obscured by extremely dense fibroglandular tissue.

Mammographic images were processed with CAD.

Targeted ultrasound of the bilateral breasts was performed. No
discrete masses or abnormalities are seen, only extremely dense
fibroglandular tissue is visualized. The central to upper right
breast and central periareolar/ slightly outer left breast were
extensively scanned.
IMPRESSION: No findings of malignancy in either breast.

RECOMMENDATION:
Screening mammogram in one year.(Code:[id number]-ADA)

I have discussed the findings and recommendations with the patient.
Results were also provided in writing at the conclusion of the
visit. If applicable, a reminder letter will be sent to the patient
regarding the next appointment.

BI-RADS CATEGORY  1: Negative.

## 2016-12-12 ENCOUNTER — Emergency Department
Admission: EM | Admit: 2016-12-12 | Discharge: 2016-12-12 | Disposition: A | Discharge status: Home / Self Care | Payer: Medicare Other | Attending: Emergency Medicine | Admitting: Emergency Medicine

## 2016-12-12 ENCOUNTER — Emergency Department: Payer: Medicare Other

## 2016-12-12 ENCOUNTER — Encounter: Payer: Self-pay | Admitting: *Deleted

## 2016-12-12 ENCOUNTER — Other Ambulatory Visit: Payer: Self-pay

## 2016-12-12 DIAGNOSIS — N183 Chronic kidney disease, stage 3 (moderate): Secondary | ICD-10-CM | POA: Insufficient documentation

## 2016-12-12 DIAGNOSIS — Z87891 Personal history of nicotine dependence: Secondary | ICD-10-CM | POA: Insufficient documentation

## 2016-12-12 DIAGNOSIS — I129 Hypertensive chronic kidney disease with stage 1 through stage 4 chronic kidney disease, or unspecified chronic kidney disease: Secondary | ICD-10-CM | POA: Insufficient documentation

## 2016-12-12 DIAGNOSIS — M25551 Pain in right hip: Secondary | ICD-10-CM | POA: Diagnosis present

## 2016-12-12 DIAGNOSIS — Z96641 Presence of right artificial hip joint: Secondary | ICD-10-CM | POA: Diagnosis not present

## 2016-12-12 DIAGNOSIS — Z79899 Other long term (current) drug therapy: Secondary | ICD-10-CM | POA: Diagnosis not present

## 2016-12-12 DIAGNOSIS — T84020A Dislocation of internal right hip prosthesis, initial encounter: Secondary | ICD-10-CM | POA: Insufficient documentation

## 2016-12-12 DIAGNOSIS — J449 Chronic obstructive pulmonary disease, unspecified: Secondary | ICD-10-CM | POA: Diagnosis not present

## 2016-12-12 DIAGNOSIS — Y792 Prosthetic and other implants, materials and accessory orthopedic devices associated with adverse incidents: Secondary | ICD-10-CM | POA: Insufficient documentation

## 2016-12-12 DIAGNOSIS — S73004A Unspecified dislocation of right hip, initial encounter: Secondary | ICD-10-CM

## 2016-12-12 DIAGNOSIS — J45909 Unspecified asthma, uncomplicated: Secondary | ICD-10-CM | POA: Insufficient documentation

## 2016-12-12 MED ORDER — MORPHINE SULFATE (PF) 2 MG/ML IV SOLN
2.0000 mg | Freq: Once | INTRAVENOUS | Status: AC
  Administered 2016-12-12: 2 mg via INTRAVENOUS
  Filled 2016-12-12: qty 1

## 2016-12-12 MED ORDER — PROPOFOL 10 MG/ML IV BOLUS
1.0000 mg/kg | Freq: Once | INTRAVENOUS | Status: AC
  Administered 2016-12-12: 71.2 mg via INTRAVENOUS
  Filled 2016-12-12: qty 20

## 2016-12-12 MED ORDER — PROPOFOL 10 MG/ML IV BOLUS: 0.5000 mg/kg | Freq: Once | INTRAVENOUS | Status: DC

## 2016-12-12 MED ORDER — FENTANYL CITRATE (PF) 100 MCG/2ML IJ SOLN
50.0000 ug | Freq: Once | INTRAMUSCULAR | Status: AC
  Administered 2016-12-12: 50 ug via INTRAVENOUS
  Filled 2016-12-12: qty 2

## 2016-12-12 NOTE — ED Notes (Signed)
Patient has a back brace on from previous back surgery.

## 2016-12-12 NOTE — ED Notes (Signed)
Patient is awake, oriented to self and situation.

## 2016-12-12 NOTE — ED Notes (Signed)
Patient ambulated with use of a walker with a steady gait. Patient was independent with transitioning from standing to sitting on the commode and standing up again. Patient was able to ambulate to and sit on wheelchair independently. Patient denies pain at this time. Knee immobilizer in place per Dr. Cherylann Banas.

## 2016-12-12 NOTE — ED Triage Notes (Signed)
Per EMS report, patient felt her right hip "give way" and lowered herself to the floor. Patient states she did not fall. Patient was discharged from rehab 2-3 weeks ago after right hip replacement. Patient had back surgery in August 2018. Patient c/o increased pain with movement.

## 2016-12-12 NOTE — ED Notes (Signed)
Patient is on O2 via [place] and on monitor. Suction and ambu bag set up. Crash cart and glideoscope at bedside.

## 2016-12-12 NOTE — Discharge Instructions (Signed)
Follow-up with Dr. Marry Guan on Monday as scheduled.  Return to the emergency department for new, worsening, or recurrent pain or other problems with the hip.

## 2016-12-12 NOTE — ED Provider Notes (Signed)
[facility] Emergency Department Provider Note ____________________________________________   First MD Initiated Contact with Patient 12/12/16 1651     (approximate)  I have reviewed the triage vital signs and the nursing notes.   HISTORY  Chief Complaint Hip Pain    HPI Kerri Carter is a 76 y.o. female status post recent right hip replacement who presents with right hip and leg pain, acute onset associated with difficulty bearing weight, and now worse with any movement of the right leg.  Patient states that she felt like her right leg was giving out, but she did not fall; she states that she let herself down gradually and had no trauma.  She states up until this afternoon, she had no significant pain there, and was able to ambulate normally.  Past Medical History:  Diagnosis Date  . Anxiety   . Asthma   . Chicken pox   . CKD (chronic kidney disease), stage III ([name])    a. s/p R nephrectomy.  . Conversion disorder   . COPD (chronic obstructive pulmonary disease) ([place])   . Depression   . Essential hypertension   . GERD (gastroesophageal reflux disease)   . Hyperlipidemia   . Inflammatory arthritis    a. hands/carpal tunnel.  b. Low titer rheumatoid factor. c. Negative anti-CCP antibodies. d. Plaquenil.  . Non-Obstructive CAD    a. 07/2009 Cath ([facility]): nonobs dzs;  b. 03/2011 [facility]): nonobs dzs.  . Osteoarthritis    a. Knees.  . PUD (peptic ulcer disease)   . Toxic maculopathy   . Valvular heart disease    a. 07/2015 Echo: EF 55-60%, Mild AI, AS, MR, and TR.    Patient Active Problem List   Diagnosis Date Noted  . Hip fracture ([place]) 10/31/2016  . Left foot pain 10/19/2016  . Spinal stenosis of lumbar region 09/15/2016  . Osteoporosis 08/27/2016  . Prediabetes 08/25/2016  . Mixed bipolar I disorder ([place]) 10/09/2015  . CKD (chronic kidney disease), stage III ([place])   . Essential hypertension   . Conversion disorder 08/04/2015  .  Hyperlipidemia 07/17/2015  . 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 Macular degeneration 07/17/2015  . Insomnia 07/17/2015  . Rheumatoid arthritis ([place]) 12/05/2014  . Valvular heart disease 10/13/2011  . COPD (chronic obstructive pulmonary disease) ([place]) 09/09/2011  . OSA (obstructive sleep apnea) 09/09/2011  . Nocturnal hypoxemia 09/09/2011  . Gastroesophageal reflux disease 02/24/2011  . Single kidney 02/24/2011    Past Surgical History:  Procedure Laterality Date  . APPENDECTOMY    . BACK SURGERY    . BUNIONECTOMY    . CESAREAN SECTION     x1  . 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 REPLACEMENT TOTAL KNEE Right   . TONSILLECTOMY    . TOTAL HIP ARTHROPLASTY  12/10/11   [facility] left hip  . TOTAL HIP ARTHROPLASTY Bilateral   . TUBAL LIGATION      Prior to Admission medications   Medication Sig Start Date End Date Taking? Authorizing Provider  albuterol (VENTOLIN HFA) 108 (90 Base) MCG/ACT inhaler Inhale 2 puffs into the lungs every 6 (six) hours as needed. 11/25/15   Juanito Doom, MD  bisacodyl (DULCOLAX) 10 MG suppository Place 1 suppository (10 mg total) rectally daily as needed for moderate constipation. 11/03/16   Max Sane, MD  BYSTOLIC 10 MG tablet take 1 tablet by mouth once daily 07/28/16   McQuaid,  Ronie Spies, MD  Calcium Carb-Cholecalciferol (CALTRATE 600+D3 SOFT) 600-800 MG-UNIT CHEW Chew 2 tablets by mouth daily.    [provider]  Calcium-Vitamin D-Vitamin K (CALCIUM SOFT CHEWS) 270-623-76 MG-UNT-MCG CHEW Chew by mouth.    [provider]  docusate sodium (COLACE) 100 MG capsule Take 1 capsule (100 mg total) by mouth 2 (two) times daily. 11/03/16   Max Sane, MD  enoxaparin (LOVENOX) 40 MG/0.4ML injection Inject 0.4 mLs (40 mg total) into the skin daily. 11/04/16 11/18/16  Max Sane, MD  escitalopram (LEXAPRO) 10 MG tablet Take 1 tablet (10 mg total) by mouth daily.  07/29/16   Rainey Pines, MD  folic acid (FOLVITE) 1 MG tablet Take 1 mg by mouth daily.    [provider]  furosemide (LASIX) 20 MG tablet take 1 tablet by mouth once daily 09/09/15   Cook, Elwood G, DO  gabapentin (NEURONTIN) 300 MG capsule TAKE ONE CAPSULE BY MOUTH FOUR TIMES DAILY 11/27/16   Leone Haven, MD  lamoTRIgine (LAMICTAL) 100 MG tablet Take 1 tablet (100 mg total) by mouth 2 (two) times daily. 07/29/16   Rainey Pines, MD  leflunomide (ARAVA) 10 MG tablet  08/12/16   [provider]  LORazepam (ATIVAN) 0.5 MG tablet Take 1 tablet (0.5 mg total) at bedtime by mouth. 12/08/16   Ursula Alert, MD  lovastatin (MEVACOR) 20 MG tablet Take 1 tablet (20 mg total) by mouth at bedtime. 10/22/15   Coral Spikes, DO  montelukast (SINGULAIR) 10 MG tablet take 1 tablet by mouth once daily 05/11/16   Cook, [place] G, DO  multivitamin-lutein (OCUVITE-LUTEIN) CAPS capsule Take 1 capsule by mouth 2 (two) times daily.    [provider]  oxyCODONE (OXY IR/ROXICODONE) 5 MG immediate release tablet Take 1 tablet (5 mg total) by mouth every 6 (six) hours as needed for breakthrough pain. 11/04/16   Max Sane, MD  pantoprazole (PROTONIX) 40 MG tablet take 1 tablet by mouth twice a day as directed 12/01/16   Leone Haven, MD  potassium chloride (K-DUR) 10 MEQ tablet take 1 tablet by mouth once daily 09/09/15   Thersa Salt G, DO  QUEtiapine (SEROQUEL) 25 MG tablet Take 1 tablet (25 mg total) by mouth at bedtime. 07/29/16   Rainey Pines, MD  RA NASAL ALLERGY 55 MCG/ACT AERO nasal inhaler instill 2 sprays into each nostril once daily 01/22/16   [provider]  ranitidine (ZANTAC) 150 MG capsule take 1 capsule by mouth twice a day 07/31/16   Crecencio Mc, MD  sucralfate (CARAFATE) 1 g tablet TAKE 1 TABLET BY MOUTH FOUR TIMES DAILY WITH MEALS AT BEDTIME 11/23/16   Jonathon Bellows, MD  Teriparatide, Recombinant, (FORTEO) 600 MCG/2.4ML SOLN Inject 0.08 mLs (20 mcg total) into the  skin daily. 11/03/16   Max Sane, MD    Allergies Ceftin [cefuroxime axetil]; Lisinopril; Morphine; Aspirin; Antihistamines, chlorpheniramine-type; Antivert [meclizine hcl]; Decongestant [pseudoephedrine hcl]; Doxycycline; Morphine and related; Polymyxin b; Pseudoephedrine hcl; Sulfa antibiotics; Xarelto [rivaroxaban]; Adhesive [tape]; Iodine; Levaquin [levofloxacin in d5w]; Tetanus toxoid adsorbed; and Tetanus toxoids  Family History  Problem Relation Age of Onset  . Rheum arthritis Mother   . Asthma Mother   . Parkinson's disease Mother   . Heart disease Mother   . Stroke Mother   . Hypertension Mother   . Heart attack Father   . Heart disease Father   . Hypertension Father   . Diabetes Son   . Asthma Sister   . Heart disease  Sister   . Lung cancer Sister   . Heart disease Sister   . Heart disease Sister   . Breast cancer Sister   . Heart attack Sister   . Heart disease Brother   . Heart disease Maternal Grandmother   . Diabetes Maternal Grandmother   . Colon cancer Maternal Grandmother   . Heart disease Brother   . Alcohol abuse Brother   . Depression Brother     Social History Social History   Tobacco Use  . Smoking status: Former Smoker    Packs/day: 0.50    Years: 20.00    Pack years: 10.00    Types: Cigarettes    Last attempt to quit: 02/02/1974    Years since quitting: 42.8  . Smokeless tobacco: Never Used  Substance Use Topics  . Alcohol use: No  . Drug use: No    Review of Systems  Constitutional: No fever. Eyes: No redness. ENT: No neck pain. Cardiovascular: Denies chest pain. Respiratory: Denies shortness of breath. Gastrointestinal: No nausea, no vomiting.   Genitourinary: Negative for dysuria.  Musculoskeletal: Negative for back pain. Skin: Negative for rash. Neurological: Negative for headaches, focal weakness or numbness.   ____________________________________________   PHYSICAL EXAM:  VITAL SIGNS: ED Triage Vitals  Enc Vitals  Group     BP 12/12/16 1632 (!) 150/54     Pulse Rate 12/12/16 1632 75     Resp 12/12/16 1632 20     Temp 12/12/16 1632 98.1 F (36.7 C)     Temp Source 12/12/16 1632 Oral     SpO2 12/12/16 1632 95 %     Weight 12/12/16 1636 157 lb (71.2 kg)     Height 12/12/16 1636 5\' 2"  (1.575 m)     Head Circumference --      Peak Flow --      Pain Score 12/12/16 1632 6     Pain Loc --      Pain Edu? --      Excl. in [place]? --     Constitutional: Alert and oriented. Well appearing and in no acute distress. Eyes: Conjunctivae are normal.  Head: Atraumatic. Nose: No congestion/rhinnorhea. Mouth/Throat: Mucous membranes are moist.   Neck: Normal range of motion.  Cardiovascular: Good peripheral circulation. Respiratory: Normal respiratory effort.   Gastrointestinal: No distention.  Genitourinary: No flank tenderness. Musculoskeletal: No lower extremity edema.  Extremities warm and well perfused.  And on any range of motion of right knee and right hip, with shortening of the right lower extremity.  Mild tenderness to right hip and groin.  No deformity.  Intact distal motor and fine touch. Neurologic:  Normal speech and language. No gross focal neurologic deficits are appreciated.  Skin:  Skin is warm and dry. No rash noted. Psychiatric: Mood and affect are normal. Speech and behavior are normal.  ____________________________________________   LABS (all labs ordered are listed, but only abnormal results are displayed)  Labs Reviewed - No data to display ____________________________________________  EKG   ____________________________________________  RADIOLOGY  XR R hip/femur: R hip dislocation  ____________________________________________   PROCEDURES  Procedure(s) performed: Yes  Reduction of dislocation Date/Time: 9:49 PM Performed by: Arta Silence Authorized by: Arta Silence Consent: Written consent obtained. Risks and benefits: risks, benefits and alternatives  were discussed Consent given by: patient Required items: required equipment available Time out: Immediately prior to procedure a "time out" was called to verify the correct patient, procedure, equipment, support staff and site/side marked as required.  Patient  sedated: Moderate sedation, propofol  Vitals: Vital signs were monitored during sedation. Patient tolerance: Patient tolerated the procedure well with no immediate complications. Joint: R hip Reduction technique: traction/countertraction.  The hip was reduced successfully on the first attempt.  The patient tolerated the procedure well with no immediate complications.     Procedural Sedation  Performed by: Arta Silence  Authorized by: Arta Silence  Indications: R hip dislocation  Universal Protocol: a time out was performed and the correct patient and site were verified  Consent: The risks and benefits of monitored anesthesia care, including the risk of aspiration, deep sedation requiring airway management including possible intubation, nausea/vomiting and the risks of not performing the procedure, including severe pain and inability to complete the procedure, were all discussed with the patient. The alternatives of performing the procedure were also discussed.   Monitoring: Continuous monitoring of heart rate, respiratory rate, pulse oximetry and ETCO2. Supplemental oxygen prior to and during procedure via nasal cannula. Resuscitation equipment available at the bedside during sedation.  Agent: Propofol, 1mg /kg (71mg )  Post-anesthesia evaluation:  Respiratory function, cardiovascular function, temperature, and mental status returned to pre-anesthetic state.  The patient tolerated the procedure well with no immediate complications.    Critical Care performed: No ____________________________________________   INITIAL IMPRESSION / ASSESSMENT AND PLAN / ED COURSE  Pertinent labs & imaging results that were  available during my care of the patient were reviewed by me and considered in my medical decision making (see chart for details).  76 year old female status post recent right hip replacement presents with acute pain in the right hip and proximal leg, associated with a feeling of the right leg giving out, and causing her to have to let herself down on the ground.  No recent trauma or fall.  No other systemic symptoms.  Review of past medical records in epic is notable for a fall with resulting right hip fracture on 9/29, and admission to the hospital until mid October.  Patient states she has been doing well since her discharge home.  On exam, patient is relatively well-appearing, vital signs are stable, and there is significant pain in the right hip and right proximal leg with any range of motion.  Presentation concerning for acute fracture, postsurgical hardware complication, versus also possible muscle strain or spasm, or soft tissue injury.  We will start with x-rays of the right hip and femur, and if no acute findings consider additional imaging based on orthopedic recommendations.  ----------------------------------------- 8:23 PM on 12/12/2016 -----------------------------------------  X-ray revealed dislocated right hip prosthesis.  I discussed the case with on-call orthopedic Dr. Posey Pronto who recommended reduction, and then to place patient in knee immobilizer.  I performed a reduction under moderate sedation with propofol.  If confirmed reduced, anticipate d/c home.     ----------------------------------------- 9:49 PM on 12/12/2016 -----------------------------------------  Right hip reduced successfully.  Patient now fully awake after propofol, and able to ambulate with a walker without assistance as well as transition from sitting to standing and back.  She feels well to go home.  Return precautions given, and patient expresses understanding.  She already has follow-up arranged in 2 days  with her orthopedist.      ____________________________________________   FINAL CLINICAL IMPRESSION(S) / ED DIAGNOSES  Final diagnoses:  Dislocation of right hip, initial encounter ([place])      NEW MEDICATIONS STARTED DURING THIS VISIT:  This SmartLink is deprecated. Use AVSMEDLIST instead to display the medication list for a patient.   Note:  This document was prepared using Dragon voice recognition software and may include unintentional dictation errors.    Arta Silence, MD 12/12/16 2149

## 2016-12-15 ENCOUNTER — Encounter: Payer: Self-pay | Admitting: Adult Health

## 2016-12-15 ENCOUNTER — Ambulatory Visit (INDEPENDENT_AMBULATORY_CARE_PROVIDER_SITE_OTHER)
Admission: RE | Admit: 2016-12-15 | Discharge: 2016-12-15 | Disposition: A | Discharge status: Home / Self Care | Payer: Medicare Other | Source: Ambulatory Visit | Attending: Adult Health | Admitting: Adult Health

## 2016-12-15 ENCOUNTER — Ambulatory Visit: Payer: Medicare Other | Admitting: Adult Health

## 2016-12-15 VITALS — BP 128/74 | HR 74

## 2016-12-15 DIAGNOSIS — J449 Chronic obstructive pulmonary disease, unspecified: Secondary | ICD-10-CM

## 2016-12-15 DIAGNOSIS — M7989 Other specified soft tissue disorders: Secondary | ICD-10-CM

## 2016-12-15 HISTORY — DX: Other specified soft tissue disorders: M79.89

## 2016-12-15 MED ORDER — AMOXICILLIN-POT CLAVULANATE 875-125 MG PO TABS: 1.0000 | ORAL_TABLET | Freq: Two times a day (BID) | ORAL | 0 refills | Status: AC

## 2016-12-15 MED ORDER — PREDNISONE 10 MG PO TABS: ORAL_TABLET | ORAL | 0 refills | Status: DC

## 2016-12-15 NOTE — Assessment & Plan Note (Signed)
Slow to resolve exacerbation -  CXR with bronchitic changes  tx w/ abx . She has multiple allergies, says she can tolerate Augmentin .   Plan  Patient Instructions  Augmentin 875mg  Twice daily  For 7 days . Take with food.  Mucinex DM Twice daily  As needed  Cough/congestion  Prednisone taper over next week .  Venous doppler of right leg.  Continue on BREO daily  follow up Dr. Lake Bells in 2 weeks and As needed   Please contact office for sooner follow up if symptoms do not improve or worsen or seek emergency care

## 2016-12-15 NOTE — Assessment & Plan Note (Addendum)
Mild right ankle swelling suspect is venous insufficiency , immobilization from recent hip surgery and dislocation . Will check venous doppler to make sure no DVT.   Plan  Elevated,  Low salt diet  ven doppler

## 2016-12-15 NOTE — Patient Instructions (Addendum)
Augmentin 875mg  Twice daily  For 7 days . Take with food.  Mucinex DM Twice daily  As needed  Cough/congestion  Prednisone taper over next week .  Venous doppler of right leg.  Continue on BREO daily  follow up Dr. Lake Bells in 2 weeks and As needed   Please contact office for sooner follow up if symptoms do not improve or worsen or seek emergency care

## 2016-12-15 NOTE — Progress Notes (Signed)
@Patient  ID: Kerri Carter, female    DOB: 06-Dec-1940, 76 y.o.   MRN: [id number]  Chief Complaint  Patient presents with  . Acute Visit    Cough     Referring provider: Coral Spikes, DO  HPI: 76 year old female former smoker followed for COPD RA on ARAVA (MTX was stopped 02/2016 )    TEST  04/2011 in March 2013 which showed: Ratio 70%, clear obstruction on flow volume loop, FEV1 1.79 L (81% predicted.), Total lung capacity 5.3 L (115% predicted), residual volume 2.49 L (136% predicted), DLCO 84% predicted PFT 11/2015 FEV1 94%, ratio 79, FVC 90%, DLCO 72% , ++BD response .    12/15/2016 Acute OV : Cough  Pt presents for an acute office for 2 months of progressively worsening cough . She is coughing up thick yellow mucus . She has been using otc cough meds and left over cough syrup . She has had more wheezing and dyspnea w/ Increase SABA use.  Last night had severe coughing fits with blood tinged mucus . Says she coughs so hard she could vomit.   Has asthma/COPD  on BREO daily and Singulair .   Last seen in office 11/2015.   Had Right Hip fracture after fall in Oct s/p replacement . Last week she lost balance , she rotated her hip and dislocated it . She underwent reduction in ER w/ success. She is wearing leg immobilizer .   She has had more swelling in right ankle since hip surgery . No calf pain . Marland Kitchen No chest pain . No fever or n/v.d.    Allergies  Allergen Reactions  . Ceftin [Cefuroxime Axetil] Anaphylaxis    Other reaction(s): Other (See Comments) REACTION: tongue and throat swell Other Reaction: TONGUE AND THROAT SWELLING REACTION: tongue and throat swell  . Lisinopril Anaphylaxis    REACTION: tongue and throat swelling (onset 10-10-09)  . Morphine Other (See Comments)    Per patient, low blood pressure issues, following morphine, that requires action to raise it back up. Drop in BP - can take small infrequent doses  . Aspirin Other (See Comments)    Sulfasalazine  allergy cross reacts  . Antihistamines, Chlorpheniramine-Type     REACTION: makes pt hyper  . Antivert [Meclizine Hcl]     Other reaction(s): Unknown REACTION: bladder will not empty REACTION: bladder will not empty  . Decongestant [Pseudoephedrine Hcl]     Other reaction(s): Unknown REACTION: makes pt hyper REACTION: makes pt hyper  . Doxycycline     REACTION: GI upset  . Morphine And Related Other (See Comments)    Per patient, low blood pressure issues, following morphine, that requires action to raise it back up.  . Polymyxin B     Medication was in eye drops.  . Pseudoephedrine Hcl Other (See Comments)    REACTION: makes pt hyper  . Sulfa Antibiotics     Face swelling  . Xarelto [Rivaroxaban]     Other reaction(s): Other (See Comments) GI bleed Stomach burning, bleeding, and tar in stool GI bleed  . Adhesive [Tape] Rash  . Iodine Hives and Rash  . Levaquin [Levofloxacin In D5w] Rash  . Tetanus Toxoid Adsorbed Other (See Comments)    REACTION: rash, fever, hot to touch at injection site  . Tetanus Toxoids     REACTION: rash, fever, hot to touch at injection site    Immunization History  Administered Date(s) Administered  . Influenza Split 10/04/2012, 10/25/2013  . Influenza Whole  11/03/2011  . Influenza, High Dose Seasonal PF 10/04/2015, 11/03/2016  . Influenza,inj,Quad PF,6+ Mos 10/31/2014  . Influenza-Unspecified 01/02/2010, 11/24/2011, 10/21/2013  . Pneumococcal Conjugate-13 10/21/2013  . Pneumococcal Polysaccharide-23 11/07/2015    Past Medical History:  Diagnosis Date  . Anxiety   . Asthma   . Chicken pox   . CKD (chronic kidney disease), stage III ([place])    a. s/p R nephrectomy.  . Conversion disorder   . COPD (chronic obstructive pulmonary disease) ([place])   . Depression   . Essential hypertension   . GERD (gastroesophageal reflux disease)   . Hyperlipidemia   . Inflammatory arthritis    a. hands/carpal tunnel.  b. Low titer rheumatoid factor. c.  Negative anti-CCP antibodies. d. Plaquenil.  . Non-Obstructive CAD    a. 07/2009 Cath ([facility]): nonobs dzs;  b. 03/2011 Cath [facility]): nonobs dzs.  . Osteoarthritis    a. Knees.  . PUD (peptic ulcer disease)   . Toxic maculopathy   . Valvular heart disease    a. 07/2015 Echo: EF 55-60%, Mild AI, AS, MR, and TR.    Tobacco History: Social History   Tobacco Use  Smoking Status Former Smoker  . Packs/day: 0.50  . Years: 20.00  . Pack years: 10.00  . Types: Cigarettes  . Last attempt to quit: 02/02/1974  . Years since quitting: 42.8  Smokeless Tobacco Never Used   Counseling given: Not Answered   Outpatient Encounter Medications as of 12/15/2016  Medication Sig  . albuterol (VENTOLIN HFA) 108 (90 Base) MCG/ACT inhaler Inhale 2 puffs into the lungs every 6 (six) hours as needed.  Marland Kitchen amoxicillin-clavulanate (AUGMENTIN) 875-125 MG tablet Take 1 tablet 2 (two) times daily for 7 days by mouth.  . bisacodyl (DULCOLAX) 10 MG suppository Place 1 suppository (10 mg total) rectally daily as needed for moderate constipation.  Marland Kitchen BYSTOLIC 10 MG tablet take 1 tablet by mouth once daily  . Calcium Carb-Cholecalciferol (CALTRATE 600+D3 SOFT) 600-800 MG-UNIT CHEW Chew 2 tablets by mouth daily.  . Calcium-Vitamin D-Vitamin K (CALCIUM SOFT CHEWS) 500-500-40 MG-UNT-MCG CHEW Chew by mouth.  . docusate sodium (COLACE) 100 MG capsule Take 1 capsule (100 mg total) by mouth 2 (two) times daily.  Marland Kitchen enoxaparin (LOVENOX) 40 MG/0.4ML injection Inject 0.4 mLs (40 mg total) into the skin daily.  Marland Kitchen escitalopram (LEXAPRO) 10 MG tablet Take 1 tablet (10 mg total) by mouth daily.  . folic acid (FOLVITE) 1 MG tablet Take 1 mg by mouth daily.  . furosemide (LASIX) 20 MG tablet take 1 tablet by mouth once daily  . gabapentin (NEURONTIN) 300 MG capsule TAKE ONE CAPSULE BY MOUTH FOUR TIMES DAILY  . lamoTRIgine (LAMICTAL) 100 MG tablet Take 1 tablet (100 mg total) by mouth 2 (two) times daily.  Marland Kitchen leflunomide (ARAVA) 10 MG tablet    . LORazepam (ATIVAN) 0.5 MG tablet Take 1 tablet (0.5 mg total) at bedtime by mouth.  . lovastatin (MEVACOR) 20 MG tablet Take 1 tablet (20 mg total) by mouth at bedtime.  . montelukast (SINGULAIR) 10 MG tablet take 1 tablet by mouth once daily  . multivitamin-lutein (OCUVITE-LUTEIN) CAPS capsule Take 1 capsule by mouth 2 (two) times daily.  Marland Kitchen oxyCODONE (OXY IR/ROXICODONE) 5 MG immediate release tablet Take 1 tablet (5 mg total) by mouth every 6 (six) hours as needed for breakthrough pain.  . pantoprazole (PROTONIX) 40 MG tablet take 1 tablet by mouth twice a day as directed  . potassium chloride (K-DUR) 10 MEQ tablet take 1 tablet  by mouth once daily  . predniSONE (DELTASONE) 10 MG tablet 4 tabs for 2 days, then 3 tabs for 2 days, 2 tabs for 2 days, then 1 tab for 2 days, then stop  . QUEtiapine (SEROQUEL) 25 MG tablet Take 1 tablet (25 mg total) by mouth at bedtime.  Marland Kitchen RA NASAL ALLERGY 55 MCG/ACT AERO nasal inhaler instill 2 sprays into each nostril once daily  . ranitidine (ZANTAC) 150 MG capsule take 1 capsule by mouth twice a day  . sucralfate (CARAFATE) 1 g tablet TAKE 1 TABLET BY MOUTH FOUR TIMES DAILY WITH MEALS AT BEDTIME  . Teriparatide, Recombinant, (FORTEO) 600 MCG/2.4ML SOLN Inject 0.08 mLs (20 mcg total) into the skin daily.   No facility-administered encounter medications on file as of 12/15/2016.      Review of Systems  Constitutional:   No  weight loss, night sweats,  Fevers, chills,  +fatigue, or  lassitude.  HEENT:   No headaches,  Difficulty swallowing,  Tooth/dental problems, or  Sore throat,                No sneezing, itching, ear ache, nasal congestion, post nasal drip,   CV:  No chest pain,  Orthopnea, PND, swelling in lower extremities, anasarca, dizziness, palpitations, syncope.   GI  No heartburn, indigestion, abdominal pain, nausea, vomiting, diarrhea, change in bowel habits, loss of appetite, bloody stools.   Resp:  .  No chest wall deformity  Skin: no  rash or lesions.  GU: no dysuria, change in color of urine, no urgency or frequency.  No flank pain, no hematuria   MS: +joint pains    Physical Exam  BP 128/74 (BP Location: Left Arm, Cuff Size: Normal)   Pulse 74   SpO2 95%   GEN: A/Ox3; pleasant , NAD, elderly , frail    HEENT:  [place]/AT,  EACs-clear, TMs-wnl, NOSE-clear, THROAT-clear, no lesions, no postnasal drip or exudate noted.   NECK:  Supple w/ fair ROM; no JVD; normal carotid impulses w/o bruits; no thyromegaly or nodules palpated; no lymphadenopathy.    RESP  Few rhonchi , no accessory muscle use, no dullness to percussion  CARD:  RRR, no m/r/g, tr  Edema on right pulses intact, no cyanosis or clubbing.neg homans sign .  GI:   Soft & nt; nml bowel sounds; no organomegaly or masses detected.   [place]: Warm bil, no deformities . Back brace, right leg immobilizer   Neuro: alert, no focal deficits noted.    Skin: Warm, no lesions or rashes    Lab Results:  CBC   BMET  BNP No results found for: BNP  ProBNP No results found for: PROBNP  Imaging: Dg Chest 2 View  Result Date: 12/15/2016 CLINICAL DATA:  Cough and congestion EXAM: CHEST  2 VIEW COMPARISON:  Chest radiograph 10/31/2016 CT abdomen 06/12/2016 FINDINGS: Normal mediastinum and cardiac silhouette. Chronic central bronchitic markings. Normal pulmonary vasculature. No effusion, infiltrate, or pneumothorax. IMPRESSION: Chronic bronchitic markings. Mild hyperinflated lungs. No acute findings. Electronically Signed   By: Suzy Bouchard M.D.   On: 12/15/2016 16:43   Dg Hip Unilat W Or Wo Pelvis 2-3 Views Right  Result Date: 12/12/2016 CLINICAL DATA:  Status post reduction of right hip dislocation EXAM: DG HIP (WITH OR WITHOUT PELVIS) 2-3V RIGHT COMPARISON:  Right hip radiographs from earlier today FINDINGS: Successful reduction of the right hip dislocation, with no residual malalignment at the right hip. Status post right hip hemiarthroplasty. No evidence  of hardware fracture  or loosening. No osseous fracture. No suspicious focal osseous lesions. IMPRESSION: Successful reduction of right hip dislocation, with no residual right hip malalignment. No osseous fracture. Right hip hemiarthroplasty. Electronically Signed   By: Ilona Sorrel M.D.   On: 12/12/2016 20:42   Dg Hip Unilat W Or Wo Pelvis 2-3 Views Right  Result Date: 12/12/2016 CLINICAL DATA:  Per EMS report, patient felt her right hip "give way" and lowered herself to the floor. Patient states she did not fall. Patient was discharged from rehab 2-3 weeks ago after right hip replacement. EXAM: DG HIP (WITH OR WITHOUT PELVIS) 2-3V RIGHT COMPARISON:  11/01/2016 FINDINGS: Superior dislocation of the RIGHT hip hemiarthroplasty. No evidence of acetabular fracture or femur fracture. IMPRESSION: Superior dislocation of the RIGHT femur hemiarthroplasty. Electronically Signed   By: Suzy Bouchard M.D.   On: 12/12/2016 17:48   Dg Femur Min 2 Views Right  Result Date: 12/12/2016 CLINICAL DATA:  New hip dislocation EXAM: RIGHT FEMUR 2 VIEWS COMPARISON:  None. FINDINGS: Severe dislocation of the RIGHT femur hemiarthroplasty from the acetabulum. No evidence of femur fracture. No acetabular fracture evident. IMPRESSION: Superior dislocation of the RIGHT femoral hemiarthroplasty Electronically Signed   By: Suzy Bouchard M.D.   On: 12/12/2016 17:48     Assessment & Plan:   COPD (chronic obstructive pulmonary disease) (HCC) Slow to resolve exacerbation -  CXR with bronchitic changes  tx w/ abx . She has multiple allergies, says she can tolerate Augmentin .   Plan  Patient Instructions  Augmentin 875mg  Twice daily  For 7 days . Take with food.  Mucinex DM Twice daily  As needed  Cough/congestion  Prednisone taper over next week .  Venous doppler of right leg.  Continue on BREO daily  follow up Dr. [place] Bells in 2 weeks and As needed   Please contact office for sooner follow up if symptoms do not  improve or worsen or seek emergency care      Leg swelling Mild right ankle swelling suspect is venous insufficiency , immobilization from recent hip surgery and dislocation . Will check venous doppler to make sure no DVT.   Plan  Elevated,  Low salt diet  ven doppler       Rexene Edison, NP 12/15/2016

## 2016-12-17 ENCOUNTER — Telehealth: Payer: Self-pay

## 2016-12-17 NOTE — Progress Notes (Signed)
Reviewed, agree 

## 2016-12-17 NOTE — Telephone Encounter (Signed)
FYI  Copied from [place] [phone]. Topic: General - Other >> Dec 17, 2016 12:04 PM Clack, Laban Emperor wrote: Reason for CRM: Hartley Barefoot with [facility] home health wanted to call and let the provider know that the pt pop her hip out of place on Sat, she went to the hospital and they pop it back in place. Pt is now wearing a hip brace. [phone]

## 2016-12-17 NOTE — Addendum Note (Signed)
Addended by: Parke Poisson E on: 12/17/2016 03:09 PM   Modules accepted: Orders

## 2016-12-18 ENCOUNTER — Other Ambulatory Visit: Payer: Self-pay | Admitting: Adult Health

## 2016-12-18 ENCOUNTER — Ambulatory Visit: Payer: Medicare Other

## 2016-12-18 ENCOUNTER — Ambulatory Visit
Admission: RE | Admit: 2016-12-18 | Discharge: 2016-12-18 | Disposition: A | Discharge status: Home / Self Care | Payer: Medicare Other | Source: Ambulatory Visit | Attending: Adult Health | Admitting: Adult Health

## 2016-12-18 DIAGNOSIS — Z9889 Other specified postprocedural states: Secondary | ICD-10-CM

## 2016-12-18 DIAGNOSIS — R053 Chronic cough: Secondary | ICD-10-CM

## 2016-12-18 DIAGNOSIS — R05 Cough: Secondary | ICD-10-CM | POA: Diagnosis not present

## 2016-12-18 DIAGNOSIS — R0602 Shortness of breath: Secondary | ICD-10-CM

## 2016-12-18 DIAGNOSIS — M25471 Effusion, right ankle: Secondary | ICD-10-CM | POA: Diagnosis not present

## 2016-12-21 ENCOUNTER — Encounter: Payer: Self-pay | Admitting: Psychiatry

## 2016-12-21 ENCOUNTER — Other Ambulatory Visit: Payer: Self-pay

## 2016-12-21 ENCOUNTER — Ambulatory Visit: Payer: Medicare Other | Admitting: Psychiatry

## 2016-12-21 VITALS — BP 155/66 | HR 74 | Temp 97.4°F | Wt 167.4 lb

## 2016-12-21 DIAGNOSIS — F3160 Bipolar disorder, current episode mixed, unspecified: Secondary | ICD-10-CM

## 2016-12-21 DIAGNOSIS — F411 Generalized anxiety disorder: Secondary | ICD-10-CM

## 2016-12-21 MED ORDER — MELATONIN 3 MG PO TABS: 3.0000 mg | ORAL_TABLET | Freq: Every day | ORAL | 2 refills | Status: DC

## 2016-12-21 MED ORDER — LAMOTRIGINE 100 MG PO TABS: 100.0000 mg | ORAL_TABLET | Freq: Two times a day (BID) | ORAL | 2 refills | Status: DC

## 2016-12-21 MED ORDER — LORAZEPAM 0.5 MG PO TABS: ORAL_TABLET | ORAL | 2 refills | Status: DC

## 2016-12-21 MED ORDER — QUETIAPINE FUMARATE 25 MG PO TABS: 25.0000 mg | ORAL_TABLET | Freq: Every day | ORAL | 2 refills | Status: DC

## 2016-12-21 MED ORDER — ESCITALOPRAM OXALATE 10 MG PO TABS: 10.0000 mg | ORAL_TABLET | Freq: Every day | ORAL | 2 refills | Status: DC

## 2016-12-21 NOTE — Progress Notes (Signed)
Psychiatric MD Progress Note  Patient Identification: Kerri Carter MRN:  [id number] Date of Evaluation:  12/21/2016 Referral Source: PCP Chief Complaint:   Chief Complaint    Follow-up; Medication Refill     Visit Diagnosis:    ICD-10-CM   1. Mixed bipolar I disorder (HCC) F31.60   2. Anxiety state F41.1 LORazepam (ATIVAN) 0.5 MG tablet    History of Present Illness:   Patient is a 76 year old white female who presented for follow up. Patient reported that she had multiple surgeries in the past 6 months and she was unable to keep her appointments. She reported that she had a hip surgery after she fell and broke her hip. Patient reported that she also had her gallbladder surgery. Patient reported that she was compliant with her medications and was getting refills on them while she was in the hospital. She reported that she went to the rehabilitation 3 times. She reported that she is currently having difficulty walking and was using the walker. Patient reported that she has support of her family and her husband. She is pleasant and cooperative during the interview. Patient reported that she sleeps well with the help of Seroquel and lorazepam but has noticed some memory and cognitive deficits. We discussed about decreasing the dose of lorazepam and she agreed with the plan.  She appears pleasant and cooperative during the interview.          Associated Signs/Symptoms: Depression Symptoms:  anxiety, (Hypo) Manic Symptoms:  Labiality of Mood, Anxiety Symptoms:  Excessive Worry, Psychotic Symptoms:  none PTSD Symptoms: Negative NA  Past Psychiatric History: H/o conversion disorder.  Previous Psychotropic Medications:  Trazodone  ambien lexapro lamictal   Substance Abuse History in the last 12 months:  No.  Consequences of Substance Abuse: Negative NA  Past Medical History:  Past Medical History:  Diagnosis Date  . Anxiety   . Asthma   . Chicken pox   . CKD  (chronic kidney disease), stage III ([place])    a. s/p R nephrectomy.  . Conversion disorder   . COPD (chronic obstructive pulmonary disease) ([place])   . Depression   . Essential hypertension   . GERD (gastroesophageal reflux disease)   . Hyperlipidemia   . Inflammatory arthritis    a. hands/carpal tunnel.  b. Low titer rheumatoid factor. c. Negative anti-CCP antibodies. d. Plaquenil.  . Non-Obstructive CAD    a. 07/2009 Cath ([facility]): nonobs dzs;  b. 03/2011 Cath [facility]): nonobs dzs.  . Osteoarthritis    a. Knees.  . PUD (peptic ulcer disease)   . Toxic maculopathy   . Valvular heart disease    a. 07/2015 Echo: EF 55-60%, Mild AI, AS, MR, and TR.    Past Surgical History:  Procedure Laterality Date  . APPENDECTOMY    . ARTHROPLASTY BIPOLAR HIP (HEMIARTHROPLASTY) Right 11/01/2016   Performed by Corky Mull, MD at [facility] ORS  . BACK SURGERY    . BUNIONECTOMY    . CESAREAN SECTION     x1  . COLONOSCOPY WITH PROPOFOL N/A 04/02/2016   Performed by Jonathon Bellows, MD at [place]  . ENDOSCOPIC RETROGRADE CHOLANGIOPANCREATOGRAPHY (ERCP) WITH PROPOFOL N/A 05/08/2016   Performed by Lucilla Lame, MD at [place]  . ESOPHAGEAL DILATION  04/02/2016   Performed by Jonathon Bellows, MD at [place]  . ESOPHAGOGASTRODUODENOSCOPY (EGD) WITH PROPOFOL N/A 04/02/2016   Performed by Jonathon Bellows, MD at [place]  . LAPAROSCOPIC CHOLECYSTECTOMY N/A 05/11/2016   Performed by Phoebe Perch  E, MD at [facility] ORS  . 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 REPLACEMENT TOTAL KNEE Right   . TONSILLECTOMY    . TOTAL HIP ARTHROPLASTY  12/10/11   [facility] left hip  . TOTAL HIP ARTHROPLASTY Bilateral   . TUBAL LIGATION      Family Psychiatric History:  denied  Family History:  Family History  Problem Relation Age of Onset  . Rheum arthritis Mother   . Asthma Mother   . Parkinson's disease Mother   . Heart disease Mother   . Stroke Mother   . Hypertension  Mother   . Heart attack Father   . Heart disease Father   . Hypertension Father   . Diabetes Son   . Asthma Sister   . Heart disease Sister   . Lung cancer Sister   . Heart disease Sister   . Heart disease Sister   . Breast cancer Sister   . Heart attack Sister   . Heart disease Brother   . Heart disease Maternal Grandmother   . Diabetes Maternal Grandmother   . Colon cancer Maternal Grandmother   . Heart disease Brother   . Alcohol abuse Brother   . Depression Brother     Social History:   Social History   Socioeconomic History  . Marital status: Married    Spouse name: richard  . Number of children: 2  . Years of education: Some Coll  . Highest education level: Some college, no degree  Social Needs  . Financial resource strain: Very hard  . Food insecurity - worry: Often true  . Food insecurity - inability: Often true  . Transportation needs - medical: No  . Transportation needs - non-medical: No  Occupational History  . Occupation: Retired    Comment: retired  Tobacco Use  . Smoking status: Former Smoker    Packs/day: 0.50    Years: 20.00    Pack years: 10.00    Types: Cigarettes    Last attempt to quit: 02/02/1974    Years since quitting: 42.9  . Smokeless tobacco: Never Used  Substance and Sexual Activity  . Alcohol use: No  . Drug use: No  . Sexual activity: Not Currently  Other Topics Concern  . None  Social History Narrative   Lives at home in [place] with her husband and grandson.   Right-handed.   6 cups coffee per day.    Additional Social History:  Married Lives with husband, grand son and his fiance.   Allergies:   Allergies  Allergen Reactions  . Ceftin [Cefuroxime Axetil] Anaphylaxis    Other reaction(s): Other (See Comments) REACTION: tongue and throat swell Other Reaction: TONGUE AND THROAT SWELLING REACTION: tongue and throat swell  . Lisinopril Anaphylaxis    REACTION: tongue and throat swelling (onset 10-10-09)  . Morphine  Other (See Comments)    Per patient, low blood pressure issues, following morphine, that requires action to raise it back up. Drop in BP - can take small infrequent doses  . Aspirin Other (See Comments)    Sulfasalazine allergy cross reacts  . Antihistamines, Chlorpheniramine-Type     REACTION: makes pt hyper  . Antivert [Meclizine Hcl]     Other reaction(s): Unknown REACTION: bladder will not empty REACTION: bladder will not empty  . Decongestant [Pseudoephedrine Hcl]     Other reaction(s): Unknown REACTION: makes pt hyper REACTION: makes pt hyper  . Doxycycline  REACTION: GI upset  . Morphine And Related Other (See Comments)    Per patient, low blood pressure issues, following morphine, that requires action to raise it back up.  . Polymyxin B     Medication was in eye drops.  . Pseudoephedrine Hcl Other (See Comments)    REACTION: makes pt hyper  . Sulfa Antibiotics     Face swelling  . Xarelto [Rivaroxaban]     Other reaction(s): Other (See Comments) GI bleed Stomach burning, bleeding, and tar in stool GI bleed  . Adhesive [Tape] Rash  . Iodine Hives and Rash  . Levaquin [Levofloxacin In D5w] Rash  . Tetanus Toxoid Adsorbed Other (See Comments)    REACTION: rash, fever, hot to touch at injection site  . Tetanus Toxoids     REACTION: rash, fever, hot to touch at injection site    Metabolic Disorder Labs: Lab Results  Component Value Date   HGBA1C 5.7 (H) 05/07/2016   MPG 117 05/07/2016   No results found for: PROLACTIN Lab Results  Component Value Date   CHOL 124 06/08/2016   TRIG 183.0 (H) 06/08/2016   HDL 44.80 06/08/2016   CHOLHDL 3 06/08/2016   VLDL 36.6 06/08/2016   LDLCALC 43 06/08/2016     Current Medications: Current Outpatient Medications  Medication Sig Dispense Refill  . albuterol (VENTOLIN HFA) 108 (90 Base) MCG/ACT inhaler Inhale 2 puffs into the lungs every 6 (six) hours as needed. 1 Inhaler 5  . amoxicillin-clavulanate (AUGMENTIN)  875-125 MG tablet Take 1 tablet 2 (two) times daily for 7 days by mouth. 14 tablet 0  . bisacodyl (DULCOLAX) 10 MG suppository Place 1 suppository (10 mg total) rectally daily as needed for moderate constipation. 12 suppository 0  . BYSTOLIC 10 MG tablet take 1 tablet by mouth once daily 30 tablet 3  . Calcium Carb-Cholecalciferol (CALTRATE 600+D3 SOFT) 600-800 MG-UNIT CHEW Chew 2 tablets by mouth daily.    . Calcium-Vitamin D-Vitamin K (CALCIUM SOFT CHEWS) 500-500-40 MG-UNT-MCG CHEW Chew by mouth.    . docusate sodium (COLACE) 100 MG capsule Take 1 capsule (100 mg total) by mouth 2 (two) times daily. 10 capsule 0  . escitalopram (LEXAPRO) 10 MG tablet Take 1 tablet (10 mg total) daily by mouth. 30 tablet 2  . folic acid (FOLVITE) 1 MG tablet Take 1 mg by mouth daily.    . furosemide (LASIX) 20 MG tablet take 1 tablet by mouth once daily 30 tablet 4  . gabapentin (NEURONTIN) 300 MG capsule TAKE ONE CAPSULE BY MOUTH FOUR TIMES DAILY 120 capsule 0  . lamoTRIgine (LAMICTAL) 100 MG tablet Take 1 tablet (100 mg total) 2 (two) times daily by mouth. 60 tablet 2  . leflunomide (ARAVA) 10 MG tablet   0  . LORazepam (ATIVAN) 0.5 MG tablet Take 1/2 pill at bed time 15 tablet 2  . lovastatin (MEVACOR) 20 MG tablet Take 1 tablet (20 mg total) by mouth at bedtime. 90 tablet 3  . montelukast (SINGULAIR) 10 MG tablet take 1 tablet by mouth once daily 90 tablet 1  . multivitamin-lutein (OCUVITE-LUTEIN) CAPS capsule Take 1 capsule by mouth 2 (two) times daily.    Marland Kitchen oxyCODONE (OXY IR/ROXICODONE) 5 MG immediate release tablet Take 1 tablet (5 mg total) by mouth every 6 (six) hours as needed for breakthrough pain. 5 tablet 0  . pantoprazole (PROTONIX) 40 MG tablet take 1 tablet by mouth twice a day as directed 60 tablet 0  . potassium chloride (K-DUR) 10 MEQ  tablet take 1 tablet by mouth once daily 30 tablet 4  . predniSONE (DELTASONE) 10 MG tablet 4 tabs for 2 days, then 3 tabs for 2 days, 2 tabs for 2 days, then 1  tab for 2 days, then stop 20 tablet 0  . QUEtiapine (SEROQUEL) 25 MG tablet Take 1 tablet (25 mg total) at bedtime by mouth. 30 tablet 2  . RA NASAL ALLERGY 55 MCG/ACT AERO nasal inhaler instill 2 sprays into each nostril once daily  0  . ranitidine (ZANTAC) 150 MG capsule take 1 capsule by mouth twice a day 60 capsule 2  . sucralfate (CARAFATE) 1 g tablet TAKE 1 TABLET BY MOUTH FOUR TIMES DAILY WITH MEALS AT BEDTIME 120 tablet 3  . Teriparatide, Recombinant, (FORTEO) 600 MCG/2.4ML SOLN Inject 0.08 mLs (20 mcg total) into the skin daily. 2.24 mL 0  . enoxaparin (LOVENOX) 40 MG/0.4ML injection Inject 0.4 mLs (40 mg total) into the skin daily. 0 Syringe   . Melatonin 3 MG TABS Take 1 tablet (3 mg total) at bedtime by mouth. 30 tablet 2   No current facility-administered medications for this visit.     Neurologic: Headache: No Seizure: No Paresthesias:No  Musculoskeletal: Strength & Muscle Tone: within normal limits Gait & Station: normal Patient leans: N/A  Psychiatric Specialty Exam: ROS  Blood pressure (!) 155/66, pulse 74, temperature (!) 97.4 F (36.3 C), temperature source Oral, weight 167 lb 6.4 oz (75.9 kg).Body mass index is 30.62 kg/m.  General Appearance: Casual  Eye Contact:  Fair  Speech:  Clear and Coherent  Volume:  Normal  Mood:  Anxious and Depressed  Affect:  Appropriate  Thought Process:  Coherent  Orientation:  Full (Time, Place, and Person)  Thought Content:  WDL  Suicidal Thoughts:  No  Homicidal Thoughts:  No  Memory:  Immediate;   Fair Recent;   Fair Remote;   Fair  Judgement:  Fair  Insight:  Fair  Psychomotor Activity:  Normal  Concentration:  Concentration: Fair and Attention Span: Fair  Recall:  [employer] of Knowledge:Fair  Language: Fair  Akathisia:  No  Handed:  Right  AIMS (if indicated):    Assets:  Communication Skills Desire for Improvement Physical Health  ADL's:  Intact  Cognition: WNL  Sleep:  poor    Treatment Plan  Summary: Medication management   Discussed with patient about her medications. I will adjust her medications as follows.  Continue lamotrigine 100 mg by mouth twice a day Continue  Lexapro 10 mg every morning Continue  lorazepam 0.5 mg -Advised patient to take half  at night at bedtime and she demonstrated understanding. Continue  Seroquel 25 mg by mouth daily at bedtime for mood symptoms and depression. I will start her on melatonin 3 mg by mouth daily at bedtime when necessary and she agreed with the plan.   Discussed with her about the side effects in detail and she demonstrated understanding.  Follow-up in  3 months or earlier depending on her symptoms.   More than 50% of the time spent in psychoeducation, counseling and coordination of care.    This note was generated in part or whole with voice recognition software. Voice regonition is usually quite accurate but there are transcription errors that can and very often do occur. I apologize for any typographical errors that were not detected and corrected.    Rainey Pines, MD 11/19/201810:58 AM

## 2016-12-21 NOTE — Progress Notes (Signed)
Called spoke with patient, advised of Venous Doppler results / recs as stated by TP.  Pt verbalized her understanding and denied any questions.

## 2016-12-22 IMAGING — DX DG FOOT COMPLETE 3+V*L*
3 series · 3 of 3 positions shown · non-contrast
Comparison: None.

CLINICAL DATA: Trip and fall with left foot pain, initial encounter

EXAM:
LEFT FOOT - COMPLETE 3+ VIEW

[foot ap]
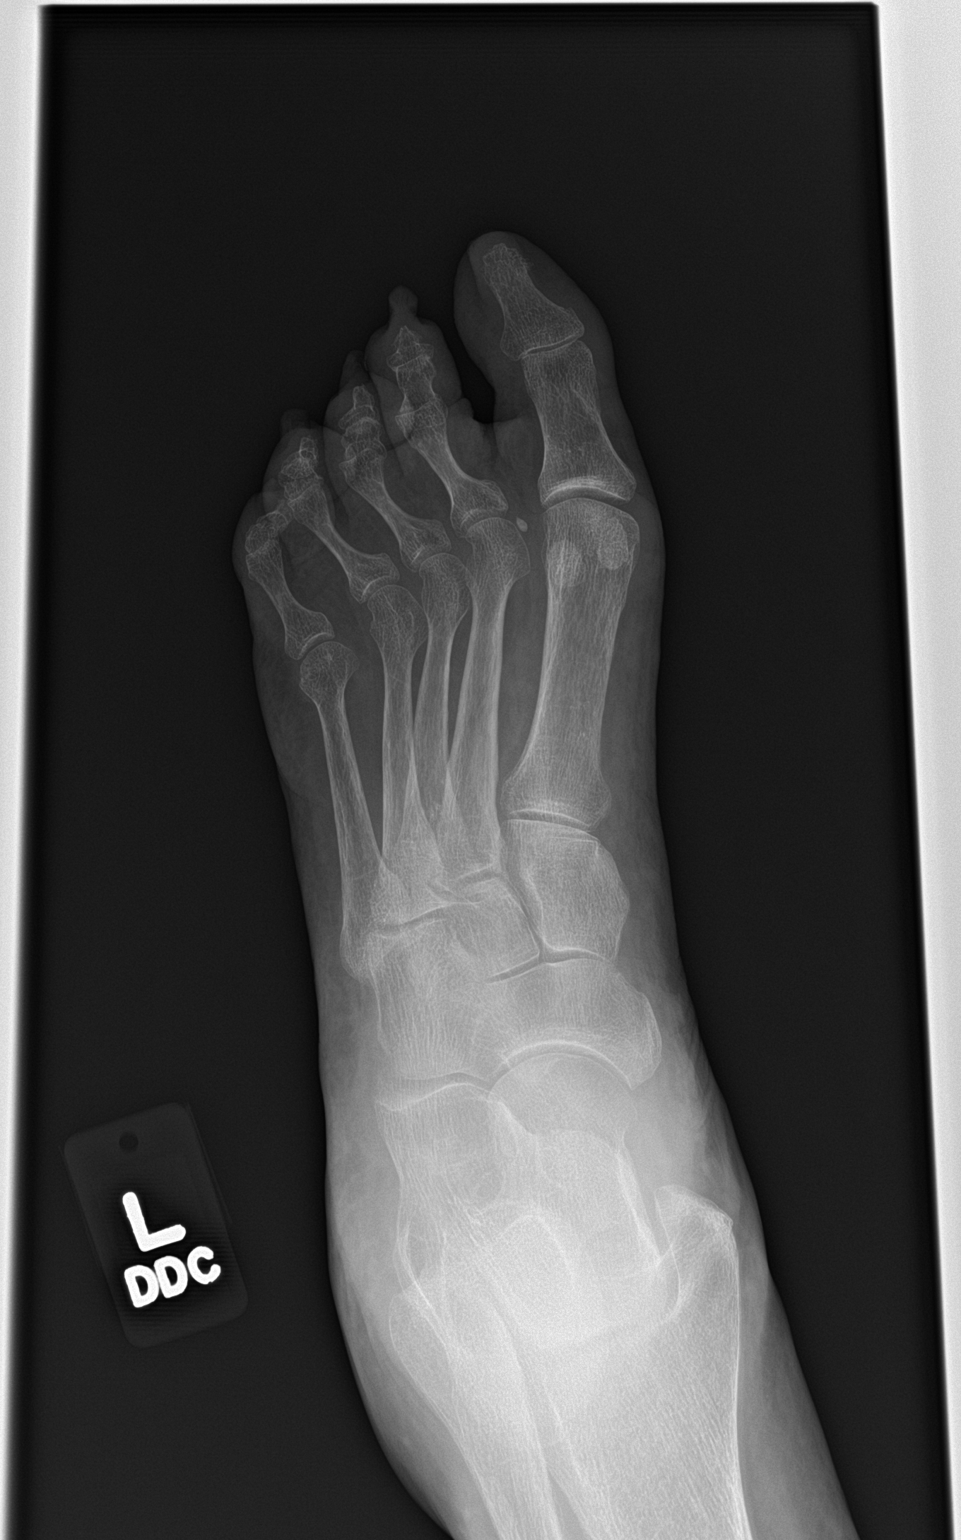

[foot obl]
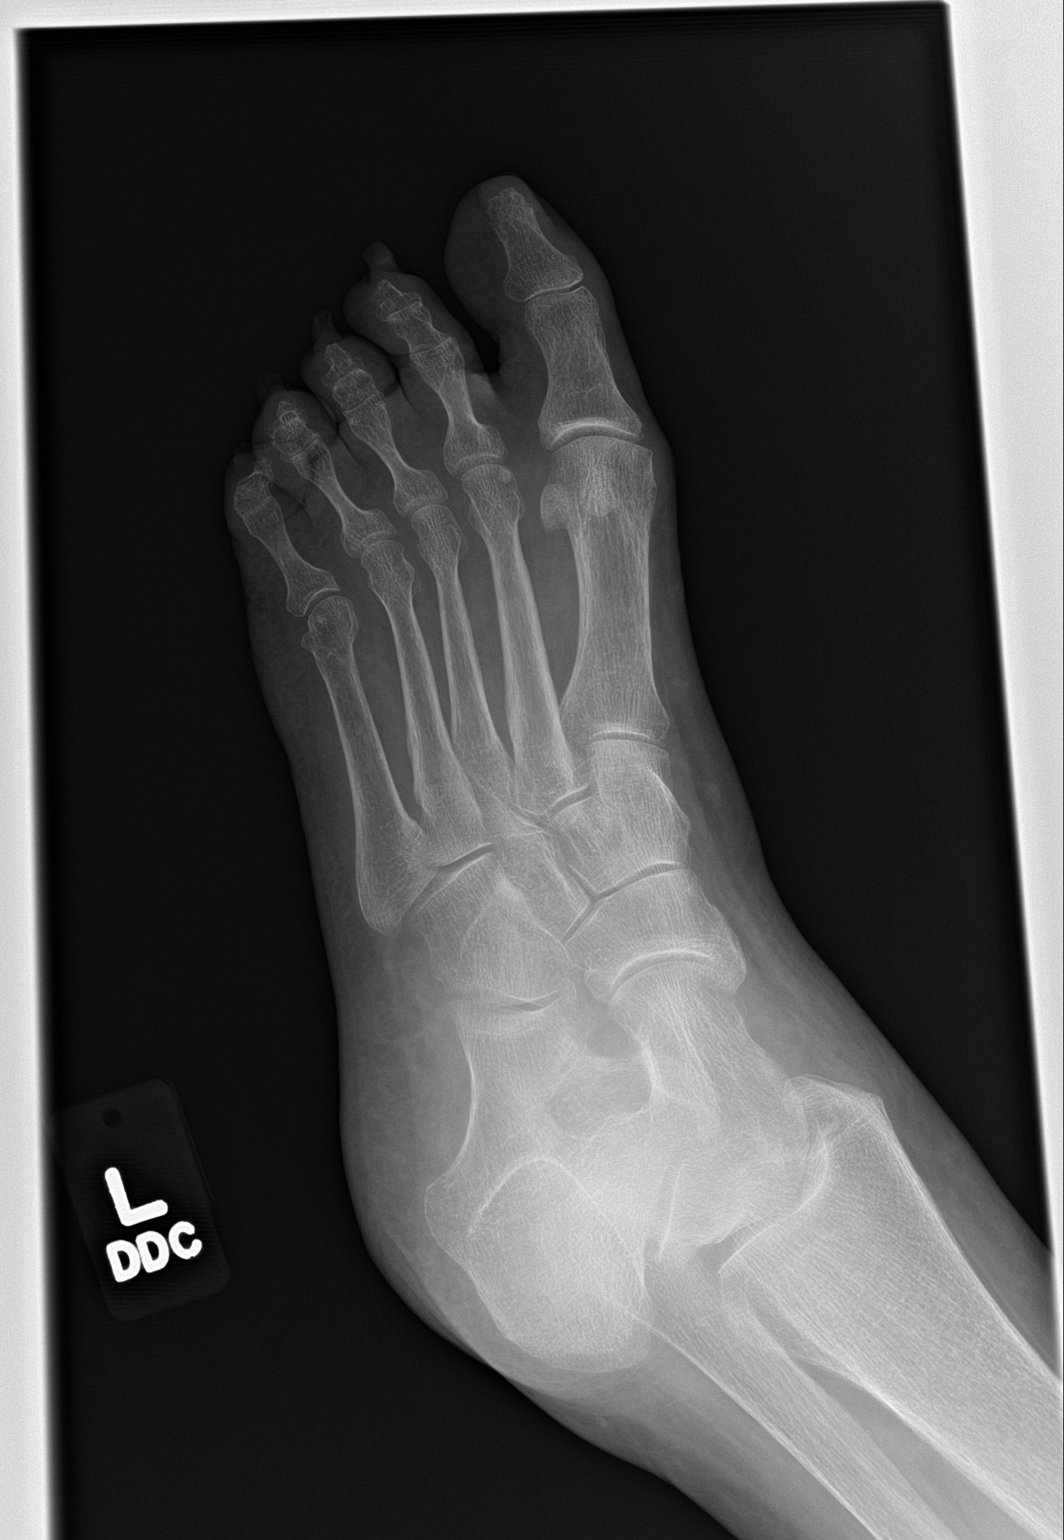

[foot lat]
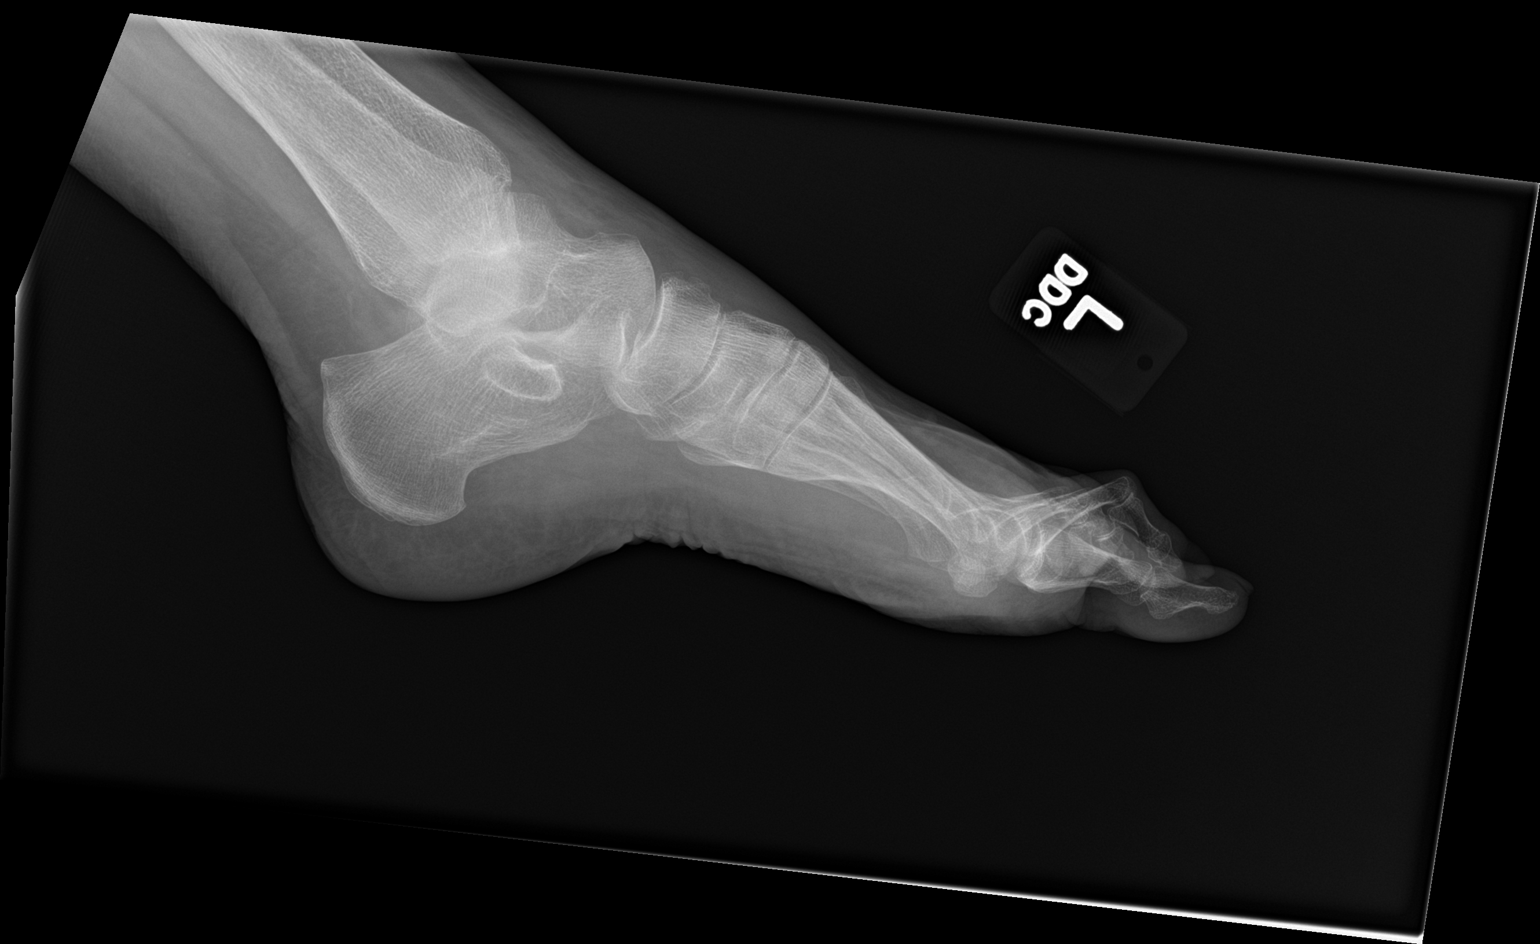

[3 of 3 positions shown; findings below may reference images not displayed]

FINDINGS: There is no evidence of fracture or dislocation. There is no
evidence of arthropathy or other focal bone abnormality. Soft
tissues are unremarkable.
IMPRESSION: No acute abnormality noted.

## 2016-12-27 ENCOUNTER — Other Ambulatory Visit: Payer: Self-pay | Admitting: Internal Medicine

## 2016-12-28 ENCOUNTER — Encounter: Payer: Self-pay | Admitting: Internal Medicine

## 2016-12-28 ENCOUNTER — Ambulatory Visit: Payer: Medicare Other | Admitting: Internal Medicine

## 2016-12-28 ENCOUNTER — Other Ambulatory Visit: Payer: Self-pay | Admitting: Family Medicine

## 2016-12-28 VITALS — BP 130/68 | HR 65 | Temp 97.9°F | Ht 63.0 in | Wt 162.5 lb

## 2016-12-28 DIAGNOSIS — J449 Chronic obstructive pulmonary disease, unspecified: Secondary | ICD-10-CM

## 2016-12-28 DIAGNOSIS — Z1231 Encounter for screening mammogram for malignant neoplasm of breast: Secondary | ICD-10-CM

## 2016-12-28 DIAGNOSIS — I10 Essential (primary) hypertension: Secondary | ICD-10-CM

## 2016-12-28 DIAGNOSIS — S72001D Fracture of unspecified part of neck of right femur, subsequent encounter for closed fracture with routine healing: Secondary | ICD-10-CM

## 2016-12-28 DIAGNOSIS — K219 Gastro-esophageal reflux disease without esophagitis: Secondary | ICD-10-CM

## 2016-12-28 DIAGNOSIS — M8000XD Age-related osteoporosis with current pathological fracture, unspecified site, subsequent encounter for fracture with routine healing: Secondary | ICD-10-CM

## 2016-12-28 DIAGNOSIS — D649 Anemia, unspecified: Secondary | ICD-10-CM

## 2016-12-28 DIAGNOSIS — Z79899 Other long term (current) drug therapy: Secondary | ICD-10-CM | POA: Diagnosis not present

## 2016-12-28 DIAGNOSIS — Z5181 Encounter for therapeutic drug level monitoring: Secondary | ICD-10-CM

## 2016-12-28 MED ORDER — FLUTICASONE FUROATE-VILANTEROL 100-25 MCG/INH IN AEPB: 1.0000 | INHALATION_SPRAY | Freq: Every day | RESPIRATORY_TRACT | 3 refills | Status: DC

## 2016-12-28 MED ORDER — RANITIDINE HCL 150 MG PO CAPS: 150.0000 mg | ORAL_CAPSULE | Freq: Two times a day (BID) | ORAL | 1 refills | Status: DC

## 2016-12-28 NOTE — Telephone Encounter (Signed)
Patient seeing DR.Mclean today

## 2016-12-28 NOTE — Patient Instructions (Signed)
Please follow up in 1 month sooner If needed  We will order mammogram  We will get you appointment with ortho moved up sooner Take care and Happy Holidays    Cough, Adult A cough helps to clear your throat and lungs. A cough may last only 2-3 weeks (acute), or it may last longer than 8 weeks (chronic). Many different things can cause a cough. A cough may be a sign of an illness or another medical condition. Follow these instructions at home:  Pay attention to any changes in your cough.  Take medicines only as told by your doctor. ? If you were prescribed an antibiotic medicine, take it as told by your doctor. Do not stop taking it even if you start to feel better. ? Talk with your doctor before you try using a cough medicine.  Drink enough fluid to keep your pee (urine) clear or pale yellow.  If the air is dry, use a cold steam vaporizer or humidifier in your home.  Stay away from things that make you cough at work or at home.  If your cough is worse at night, try using extra pillows to raise your head up higher while you sleep.  Do not smoke, and try not to be around smoke. If you need help quitting, ask your doctor.  Do not have caffeine.  Do not drink alcohol.  Rest as needed. Contact a doctor if:  You have new problems (symptoms).  You cough up yellow fluid (pus).  Your cough does not get better after 2-3 weeks, or your cough gets worse.  Medicine does not help your cough and you are not sleeping well.  You have pain that gets worse or pain that is not helped with medicine.  You have a fever.  You are losing weight and you do not know why.  You have night sweats. Get help right away if:  You cough up blood.  You have trouble breathing.  Your heartbeat is very fast. This information is not intended to replace advice given to you by your health care provider. Make sure you discuss any questions you have with your health care provider. Document Released:  10/02/2010 Document Revised: 06/27/2015 Document Reviewed: 03/28/2014 Elsevier Interactive Patient Education  2018 [place].  Hip Pain The hip is the joint between the upper legs and the lower pelvis. The bones, cartilage, tendons, and muscles of your hip joint support your body and allow you to move around. Hip pain can range from a minor ache to severe pain in one or both of your hips. The pain may be felt on the inside of the hip joint near the groin, or the outside near the buttocks and upper thigh. You may also have swelling or stiffness. Follow these instructions at home: Managing pain, stiffness, and swelling  If directed, apply ice to the injured area. ? Put ice in a plastic bag. ? Place a towel between your skin and the bag. ? Leave the ice on for 20 minutes, 2-3 times a day  Sleep with a pillow between your legs on your most comfortable side.  Avoid any activities that cause pain. General instructions  Take over-the-counter and prescription medicines only as told by your health care provider.  Do any exercises as told by your health care provider.  Record the following: ? How often you have hip pain. ? The location of your pain. ? What the pain feels like. ? What makes the pain worse.  Keep all  follow-up visits as told by your health care provider. This is important. Contact a health care provider if:  You cannot put weight on your leg.  Your pain or swelling continues or gets worse after one week.  It gets harder to walk.  You have a fever. Get help right away if:  You fall.  You have a sudden increase in pain and swelling in your hip.  Your hip is red or swollen or very tender to touch. Summary  Hip pain can range from a minor ache to severe pain in one or both of your hips.  The pain may be felt on the inside of the hip joint near the groin, or the outside near the buttocks and upper thigh.  Avoid any activities that cause pain.  Record how  often you have hip pain, the location of the pain, what makes it worse and what it feels like. This information is not intended to replace advice given to you by your health care provider. Make sure you discuss any questions you have with your health care provider. Document Released: 07/09/2009 Document Revised: 12/23/2015 Document Reviewed: 12/23/2015 Elsevier Interactive Patient Education  2017 [employer].

## 2016-12-29 ENCOUNTER — Encounter: Payer: Self-pay | Admitting: Internal Medicine

## 2016-12-29 LAB — CBC
HCT: 35.5 % — ABNORMAL LOW (ref 36.0–46.0)
Hemoglobin: 11.1 g/dL — ABNORMAL LOW (ref 12.0–15.0)
MCHC: 31.3 g/dL (ref 30.0–36.0)
MCV: 83.4 fl (ref 78.0–100.0)
Platelets: 294 10*3/uL (ref 150.0–400.0)
RBC: 4.26 Mil/uL (ref 3.87–5.11)
RDW: 16.4 % — ABNORMAL HIGH (ref 11.5–15.5)
WBC: 14 10*3/uL — ABNORMAL HIGH (ref 4.0–10.5)

## 2016-12-29 LAB — VITAMIN B12: Vitamin B-12: 293 pg/mL (ref 211–911)

## 2016-12-29 LAB — IRON,TIBC AND FERRITIN PANEL
%SAT: 12 % (calc) (ref 11–50)
Ferritin: 50 ng/mL (ref 20–288)
Iron: 43 ug/dL — ABNORMAL LOW (ref 45–160)
TIBC: 347 mcg/dL (calc) (ref 250–450)

## 2016-12-29 LAB — MAGNESIUM: Magnesium: 2.1 mg/dL (ref 1.5–2.5)

## 2016-12-29 LAB — VITAMIN D 25 HYDROXY (VIT D DEFICIENCY, FRACTURES): VITD: 17.71 ng/mL — ABNORMAL LOW (ref 30.00–100.00)

## 2016-12-29 NOTE — Progress Notes (Signed)
Chief Complaint  Patient presents with  . Establish Care  . Groin Pain   Pt presents for f/u  1. Right hip fracture 11/01/16 s/p repair Dr. Roland Rack. She has dislocated it 1x since repair via dusting and bending and she felt like she was going to fall and in order to prevent fall the way she moved dislocated hip. She was told to f/u in 6 weeks and has appt 01/22/17 and already has been since 1x since surgery. She c/o right groin pain like when she dislocated it previously.  She has on orthotic device to prevent movement of hip/leg but it is hard to wear she admits.  Dr. Roland Rack did the surgery but Dr. Rudene Christians is her primary ortho MD  2. Asthma/COPD per imaging just completed Abx and steroids. Wants refill of Breo Dr. Lake Bells is her lung doctor in [place]. She c/o cough still a night and has Tessalon Perles but cant take other OTC meds for cough. She also has prn Albuterol and neb tx's that are her grandsons. She reports she is former smoker quit in 1977 smoked x 19 years 1 carton would last 2 weeks. No FH lung cancer  3. H/o GERD and PUD and hiatal hernia. She is on ranitidine 150 bid and needs refill and also would like refill of Protonix 40 mg bid which she reports she has been taking x 8 years.  4. Of note Lasix was dc'ed by pt due to urinary incontinence so no longer taking  5. Chronic pain she reports she is alternating oxy IR 5 mg with ultram and also back pain is controlled s/p surgery    Review of Systems  Constitutional: Negative for weight loss.  Respiratory: Positive for cough. Negative for shortness of breath.        Cough qhs   Cardiovascular: Negative for chest pain.  Gastrointestinal: Positive for heartburn.  Genitourinary:       +urinary incontinence   Musculoskeletal: Positive for joint pain. Negative for back pain.   Past Medical History:  Diagnosis Date  . Anxiety   . Asthma   . Cataract    s/p b/l repair   . Chicken pox   . CKD (chronic kidney disease)   . CKD (chronic kidney  disease), stage III ([place])    a. s/p R nephrectomy.  . Conversion disorder   . COPD (chronic obstructive pulmonary disease) ([place])   . Depression   . Essential hypertension   . GERD (gastroesophageal reflux disease)   . Hyperlipidemia   . Inflammatory arthritis    a. hands/carpal tunnel.  b. Low titer rheumatoid factor. c. Negative anti-CCP antibodies. d. Plaquenil.  . Non-Obstructive CAD    a. 07/2009 Cath ([facility]): nonobs dzs;  b. 03/2011 Cath [facility]): nonobs dzs.  . Osteoarthritis    a. Knees.  . PUD (peptic ulcer disease)   . S/P right hip fracture    11/01/16 s/p repair  . Spinal stenosis at L4-L5 level    severe with L4/L5 anterolisthesis grade 1 anterolisthesis   . Toxic maculopathy   . Valvular heart disease    a. 07/2015 Echo: EF 55-60%, Mild AI, AS, MR, and TR.   Past Surgical History:  Procedure Laterality Date  . APPENDECTOMY    . BACK SURGERY    . BUNIONECTOMY    . CATARACT EXTRACTION, BILATERAL    . CESAREAN SECTION     x1  . CHOLECYSTECTOMY N/A 05/11/2016   Procedure: LAPAROSCOPIC CHOLECYSTECTOMY;  Surgeon: Florene Glen, MD;  Location: [facility] ORS;  Service: General;  Laterality: N/A;  . COLONOSCOPY WITH PROPOFOL N/A 04/02/2016   Procedure: COLONOSCOPY WITH PROPOFOL;  Surgeon: Jonathon Bellows, MD;  Location: [facility] ENDOSCOPY;  Service: Endoscopy;  Laterality: N/A;  . ENDOSCOPIC RETROGRADE CHOLANGIOPANCREATOGRAPHY (ERCP) WITH PROPOFOL N/A 05/08/2016   Procedure: ENDOSCOPIC RETROGRADE CHOLANGIOPANCREATOGRAPHY (ERCP) WITH PROPOFOL;  Surgeon: Lucilla Lame, MD;  Location: [facility] ENDOSCOPY;  Service: Endoscopy;  Laterality: N/A;  . ESOPHAGEAL DILATION  04/02/2016   Procedure: ESOPHAGEAL DILATION;  Surgeon: Jonathon Bellows, MD;  Location: [facility] ENDOSCOPY;  Service: Endoscopy;;  . ESOPHAGOGASTRODUODENOSCOPY (EGD) WITH PROPOFOL N/A 04/02/2016   Procedure: ESOPHAGOGASTRODUODENOSCOPY (EGD) WITH PROPOFOL;  Surgeon: Jonathon Bellows, MD;  Location: [facility] ENDOSCOPY;  Service: Endoscopy;  Laterality: N/A;  . HIP  ARTHROPLASTY Right 11/01/2016   Procedure: ARTHROPLASTY BIPOLAR HIP (HEMIARTHROPLASTY);  Surgeon: Corky Mull, MD;  Location: [facility] ORS;  Service: Orthopedics;  Laterality: Right;  . 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 osteoporosis     noted DEXA 08/19/16   . REPLACEMENT TOTAL KNEE Right   . TONSILLECTOMY    . TOTAL HIP ARTHROPLASTY  12/10/11   [facility] left hip  . TOTAL HIP ARTHROPLASTY Bilateral   . TUBAL LIGATION     Family History  Problem Relation Age of Onset  . Rheum arthritis Mother   . Asthma Mother   . Parkinson's disease Mother   . Heart disease Mother   . Stroke Mother   . Hypertension Mother   . Heart attack Father   . Heart disease Father   . Hypertension Father   . Diabetes Son   . Asthma Sister   . Heart disease Sister   . Lung cancer Sister   . Heart disease Sister   . Heart disease Sister   . Breast cancer Sister   . Heart attack Sister   . Heart disease Brother   . Heart disease Maternal Grandmother   . Diabetes Maternal Grandmother   . Colon cancer Maternal Grandmother   . Heart disease Brother   . Alcohol abuse Brother   . Depression Brother    Social History   Socioeconomic History  . Marital status: Married    Spouse name: richard  . Number of children: 2  . Years of education: Some Coll  . Highest education level: Some college, no degree  Social Needs  . Financial resource strain: Very hard  . Food insecurity - worry: Often true  . Food insecurity - inability: Often true  . Transportation needs - medical: No  . Transportation needs - non-medical: No  Occupational History  . Occupation: Retired    Comment: retired  Tobacco Use  . Smoking status: Former Smoker    Packs/day: 0.50    Years: 20.00    Pack years: 10.00    Types: Cigarettes    Last attempt to quit: 02/02/1974    Years since quitting: 42.9  . Smokeless tobacco: Never Used  Substance and Sexual Activity  . Alcohol use: No   . Drug use: No  . Sexual activity: Not Currently  Other Topics Concern  . Not on file  Social History Narrative   Lives at home in [place] with her husband and grandson.   Right-handed.   6 cups coffee per day.   Current Meds  Medication Sig  . albuterol (VENTOLIN HFA) 108 (90 Base) MCG/ACT inhaler Inhale 2 puffs into the lungs every 6 (six) hours as needed.  Marland Kitchen  benzonatate (TESSALON) 200 MG capsule take 1 capsule by mouth three times a day if needed for cough  . BYSTOLIC 10 MG tablet take 1 tablet by mouth once daily  . Calcium-Vitamin D-Vitamin K (CALCIUM SOFT CHEWS) 353-299-24 MG-UNT-MCG CHEW Chew by mouth.  . escitalopram (LEXAPRO) 10 MG tablet Take 1 tablet (10 mg total) daily by mouth.  . fluticasone furoate-vilanterol (BREO ELLIPTA) 100-25 MCG/INH AEPB Inhale 1 puff into the lungs daily.  . folic acid (FOLVITE) 1 MG tablet Take 1 mg by mouth daily.  Marland Kitchen gabapentin (NEURONTIN) 300 MG capsule TAKE ONE CAPSULE BY MOUTH FOUR TIMES DAILY  . Insulin Pen Needle (FIFTY50 PEN NEEDLES) 32G X 4 MM MISC Use 1 each once daily.  Marland Kitchen lamoTRIgine (LAMICTAL) 100 MG tablet Take 1 tablet (100 mg total) 2 (two) times daily by mouth.  . leflunomide (ARAVA) 10 MG tablet   . LORazepam (ATIVAN) 0.5 MG tablet Take 1/2 pill at bed time  . lovastatin (MEVACOR) 20 MG tablet Take 1 tablet (20 mg total) by mouth at bedtime.  . Melatonin 3 MG TABS Take 1 tablet (3 mg total) at bedtime by mouth.  . montelukast (SINGULAIR) 10 MG tablet take 1 tablet by mouth once daily  . multivitamin-lutein (OCUVITE-LUTEIN) CAPS capsule Take 1 capsule by mouth 2 (two) times daily.  Marland Kitchen olopatadine (PATANOL) 0.1 % ophthalmic solution Apply to eye.  Marland Kitchen oxyCODONE (OXY IR/ROXICODONE) 5 MG immediate release tablet Take 1 tablet (5 mg total) by mouth every 6 (six) hours as needed for breakthrough pain.  . pantoprazole (PROTONIX) 40 MG tablet take 1 tablet by mouth twice a day as directed  . potassium chloride (K-DUR) 10 MEQ tablet take  1 tablet by mouth once daily  . QUEtiapine (SEROQUEL) 25 MG tablet Take 1 tablet (25 mg total) at bedtime by mouth.  . RA NASAL ALLERGY 55 MCG/ACT AERO nasal inhaler instill 2 sprays into each nostril once daily  . ranitidine (ZANTAC) 150 MG capsule Take 1 capsule (150 mg total) by mouth 2 (two) times daily.  . sucralfate (CARAFATE) 1 g tablet TAKE 1 TABLET BY MOUTH FOUR TIMES DAILY WITH MEALS AT BEDTIME  . Teriparatide, Recombinant, (FORTEO) 600 MCG/2.4ML SOLN Inject 0.08 mLs (20 mcg total) into the skin daily.  . traMADol (ULTRAM) 50 MG tablet TAKE 1 TABLET BY MOUTH EVERY 6 HOURS IF NEEDED FOR PAIN  . [DISCONTINUED] fluticasone furoate-vilanterol (BREO ELLIPTA) 100-25 MCG/INH AEPB Inhale into the lungs.  . [DISCONTINUED] ranitidine (ZANTAC) 150 MG capsule take 1 capsule by mouth twice a day   Allergies  Allergen Reactions  . Ceftin [Cefuroxime Axetil] Anaphylaxis    Other reaction(s): Other (See Comments) REACTION: tongue and throat swell Other Reaction: TONGUE AND THROAT SWELLING REACTION: tongue and throat swell  . Lisinopril Anaphylaxis    REACTION: tongue and throat swelling (onset 10-10-09)  . Morphine Other (See Comments)    Per patient, low blood pressure issues, following morphine, that requires action to raise it back up. Drop in BP - can take small infrequent doses  . Sulfasalazine Anaphylaxis    Swelling tongue, lips, and jaws  . Aspirin Other (See Comments)    Sulfasalazine allergy cross reacts  . Antihistamines, Chlorpheniramine-Type     REACTION: makes pt hyper  . Antivert [Meclizine Hcl]     Other reaction(s): Unknown REACTION: bladder will not empty REACTION: bladder will not empty  . Decongestant [Pseudoephedrine Hcl]     Other reaction(s): Unknown REACTION: makes pt hyper REACTION: makes pt hyper  .  Doxycycline     REACTION: GI upset  . Morphine And Related Other (See Comments)    Per patient, low blood pressure issues, following morphine, that requires action  to raise it back up.  . Polymyxin B     Medication was in eye drops.  . Pseudoephedrine Hcl Other (See Comments)    REACTION: makes pt hyper  . Sulfa Antibiotics     Face swelling  . Xarelto [Rivaroxaban]     Other reaction(s): Other (See Comments) GI bleed Stomach burning, bleeding, and tar in stool GI bleed  . Adhesive [Tape] Rash  . Iodine Hives and Rash  . Levaquin [Levofloxacin In D5w] Rash  . Tetanus Toxoid Adsorbed Other (See Comments)    REACTION: rash, fever, hot to touch at injection site  . Tetanus Toxoids     REACTION: rash, fever, hot to touch at injection site   Recent Results (from the past 2160 hour(s))  CBC with Differential/Platelet     Status: Abnormal   Collection Time: 10/13/16  5:27 PM  Result Value Ref Range   WBC 10.1 3.6 - 11.0 K/uL   RBC 4.62 3.80 - 5.20 MIL/uL   Hemoglobin 12.8 12.0 - 16.0 g/dL   HCT 38.3 35.0 - 47.0 %   MCV 82.9 80.0 - 100.0 fL   MCH 27.6 26.0 - 34.0 pg   MCHC 33.3 32.0 - 36.0 g/dL   RDW 16.5 (H) 11.5 - 14.5 %   Platelets 228 150 - 440 K/uL   Neutrophils Relative % 81 %   Neutro Abs 8.2 (H) 1.4 - 6.5 K/uL   Lymphocytes Relative 15 %   Lymphs Abs 1.5 1.0 - 3.6 K/uL   Monocytes Relative 2 %   Monocytes Absolute 0.2 0.2 - 0.9 K/uL   Eosinophils Relative 1 %   Eosinophils Absolute 0.1 0 - 0.7 K/uL   Basophils Relative 1 %   Basophils Absolute 0.1 0 - 0.1 K/uL  VAS [place] LOWER EXTREMITY ARTERIAL DUPLEX     Status: None   Collection Time: 10/20/16  2:42 PM  Result Value Ref Range   Left super femoral prox sys PSV 124 cm/s   Left super femoral mid sys PSV -139 cm/s   Left super femoral dist sys PSV -134 cm/s   Left popliteal prox sys PSV 83 cm/s   Left popliteal dist sys PSV -101 cm/s   Right super femoral prox sys PSV -170 cm/s   Right super femoral mid sys PSV -132 cm/s   Right super femoral dist sys PSV -123 cm/s   Right popliteal prox sys PSV 88 cm/s   Right popliteal dist sys PSV -104 cm/s   Right peroneal sys PSV 67 cm/s    Left ant tibial distal sys 73 cm/s   left post tibial dist sys -55 cm/s   LEFT PERO DIST SYS 124.00 cm/s   RIGHT ANT DIST TIBAL SYS PSV 98 cm/s   RIGHT POST TIB DIST SYS 141 cm/s  CBC with Differential     Status: Abnormal   Collection Time: 10/31/16  6:28 PM  Result Value Ref Range   WBC 9.9 3.6 - 11.0 K/uL   RBC 4.12 3.80 - 5.20 MIL/uL   Hemoglobin 11.4 (L) 12.0 - 16.0 g/dL   HCT 34.4 (L) 35.0 - 47.0 %   MCV 83.4 80.0 - 100.0 fL   MCH 27.8 26.0 - 34.0 pg   MCHC 33.3 32.0 - 36.0 g/dL   RDW 15.9 (H) 11.5 - 14.5 %  Platelets 191 150 - 440 K/uL   Neutrophils Relative % 56 %   Neutro Abs 5.6 1.4 - 6.5 K/uL   Lymphocytes Relative 29 %   Lymphs Abs 2.8 1.0 - 3.6 K/uL   Monocytes Relative 7 %   Monocytes Absolute 0.7 0.2 - 0.9 K/uL   Eosinophils Relative 7 %   Eosinophils Absolute 0.7 0 - 0.7 K/uL   Basophils Relative 1 %   Basophils Absolute 0.1 0 - 0.1 K/uL  Basic metabolic panel     Status: Abnormal   Collection Time: 10/31/16  6:28 PM  Result Value Ref Range   Sodium 137 135 - 145 mmol/L   Potassium 4.1 3.5 - 5.1 mmol/L   Chloride 101 101 - 111 mmol/L   CO2 26 22 - 32 mmol/L   Glucose, Bld 129 (H) 65 - 99 mg/dL   BUN 14 6 - 20 mg/dL   Creatinine, Ser 1.40 (H) 0.44 - 1.00 mg/dL   Calcium 9.1 8.9 - 10.3 mg/dL   GFR calc non Af Amer 36 (L) >60 mL/min   GFR calc Af Amer 42 (L) >60 mL/min    Comment: (NOTE) The eGFR has been calculated using the CKD EPI equation. This calculation has not been validated in all clinical situations. eGFR's persistently <60 mL/min signify possible Chronic Kidney Disease.    Anion gap 10 5 - 15  Type and screen [facility]     Status: None   Collection Time: 10/31/16  6:28 PM  Result Value Ref Range   ABO/RH(D) AB POS    Antibody Screen NEG    Sample Expiration 11/03/2016   Protime-INR     Status: None   Collection Time: 10/31/16  6:28 PM  Result Value Ref Range   Prothrombin Time 13.0 11.4 - 15.2 seconds   INR  0.99   APTT     Status: None   Collection Time: 10/31/16  6:28 PM  Result Value Ref Range   aPTT 27 24 - 36 seconds  Surgical PCR screen     Status: None   Collection Time: 10/31/16 10:30 PM  Result Value Ref Range   MRSA, PCR NEGATIVE NEGATIVE   Staphylococcus aureus NEGATIVE NEGATIVE    Comment: (NOTE) The Xpert SA Assay (FDA approved for NASAL specimens in patients 71 years of age and older), is one component of a comprehensive surveillance program. It is not intended to diagnose infection nor to guide or monitor treatment.   Surgical pathology     Status: None   Collection Time: 11/01/16  9:59 AM  Result Value Ref Range   SURGICAL PATHOLOGY      Surgical Pathology CASE: [id number] PATIENT: Jeanella Anton Surgical Pathology Report     SPECIMEN SUBMITTED: A. Femoral head, right  CLINICAL HISTORY: None provided  PRE-OPERATIVE DIAGNOSIS: Right hip fracture  POST-OPERATIVE DIAGNOSIS: None provided.     DIAGNOSIS: A. FEMORAL HEAD, RIGHT: - CHANGES CONSISTENT WITH PROVIDED HISTORY OF FRACTURE. - NEGATIVE FOR MALIGNANCY.   GROSS DESCRIPTION:  A. Labeled: right femoral head  Size of specimen:      Head -4.0 x 4.3 cm      Neck -shaggy, 3.8 x 4.3 cm  Articular surface: tan to brown with focal granularity  Cut surface: tan to brown  Other findings: none noted  Block summary: 1 - 2-representative section(s), post decalcification    Final Diagnosis performed by Quay Burow, MD.  Electronically signed 11/03/2016 10:43:28AM    The electronic signature indicates that  the named Attending Pathologist has evaluated the specimen  Technical component performed at [facility], [address] [place], [place] [address] Lab: [phone] Dir: Darrick Penna. Evette Doffing, MD  Professional component performed at [facility], [facility], [place], [place], [place] [address] Lab: [phone] Dir: Dellia Nims. Rubinas, MD    CBC with  Differential/Platelet     Status: Abnormal   Collection Time: 11/02/16  3:21 AM  Result Value Ref Range   WBC 12.1 (H) 3.6 - 11.0 K/uL   RBC 3.72 (L) 3.80 - 5.20 MIL/uL   Hemoglobin 10.4 (L) 12.0 - 16.0 g/dL   HCT 31.1 (L) 35.0 - 47.0 %   MCV 83.6 80.0 - 100.0 fL   MCH 28.0 26.0 - 34.0 pg   MCHC 33.6 32.0 - 36.0 g/dL   RDW 16.2 (H) 11.5 - 14.5 %   Platelets 175 150 - 440 K/uL   Neutrophils Relative % 85 %   Neutro Abs 10.3 (H) 1.4 - 6.5 K/uL   Lymphocytes Relative 7 %   Lymphs Abs 0.9 (L) 1.0 - 3.6 K/uL   Monocytes Relative 7 %   Monocytes Absolute 0.8 0.2 - 0.9 K/uL   Eosinophils Relative 0 %   Eosinophils Absolute 0.0 0 - 0.7 K/uL   Basophils Relative 1 %   Basophils Absolute 0.1 0 - 0.1 K/uL  Basic metabolic panel     Status: Abnormal   Collection Time: 11/02/16  3:21 AM  Result Value Ref Range   Sodium 140 135 - 145 mmol/L   Potassium 4.9 3.5 - 5.1 mmol/L   Chloride 107 101 - 111 mmol/L   CO2 27 22 - 32 mmol/L   Glucose, Bld 171 (H) 65 - 99 mg/dL   BUN 16 6 - 20 mg/dL   Creatinine, Ser 1.27 (H) 0.44 - 1.00 mg/dL   Calcium 8.2 (L) 8.9 - 10.3 mg/dL   GFR calc non Af Amer 41 (L) >60 mL/min   GFR calc Af Amer 47 (L) >60 mL/min    Comment: (NOTE) The eGFR has been calculated using the CKD EPI equation. This calculation has not been validated in all clinical situations. eGFR's persistently <60 mL/min signify possible Chronic Kidney Disease.    Anion gap 6 5 - 15  Basic metabolic panel     Status: Abnormal   Collection Time: 11/03/16  3:55 AM  Result Value Ref Range   Sodium 135 135 - 145 mmol/L   Potassium 4.1 3.5 - 5.1 mmol/L   Chloride 100 (L) 101 - 111 mmol/L   CO2 28 22 - 32 mmol/L   Glucose, Bld 107 (H) 65 - 99 mg/dL   BUN 13 6 - 20 mg/dL   Creatinine, Ser 1.06 (H) 0.44 - 1.00 mg/dL   Calcium 8.2 (L) 8.9 - 10.3 mg/dL   GFR calc non Af Amer 50 (L) >60 mL/min   GFR calc Af Amer 58 (L) >60 mL/min    Comment: (NOTE) The eGFR has been calculated using the CKD EPI  equation. This calculation has not been validated in all clinical situations. eGFR's persistently <60 mL/min signify possible Chronic Kidney Disease.    Anion gap 7 5 - 15  CBC     Status: Abnormal   Collection Time: 11/03/16  3:55 AM  Result Value Ref Range   WBC 11.2 (H) 3.6 - 11.0 K/uL   RBC 4.02 3.80 - 5.20 MIL/uL   Hemoglobin 11.0 (L) 12.0 - 16.0 g/dL   HCT 33.3 (L) 35.0 -  47.0 %   MCV 82.8 80.0 - 100.0 fL   MCH 27.3 26.0 - 34.0 pg   MCHC 33.0 32.0 - 36.0 g/dL   RDW 16.3 (H) 11.5 - 14.5 %   Platelets 189 150 - 440 K/uL  Basic metabolic panel     Status: Abnormal   Collection Time: 11/04/16  4:14 AM  Result Value Ref Range   Sodium 136 135 - 145 mmol/L   Potassium 3.8 3.5 - 5.1 mmol/L   Chloride 102 101 - 111 mmol/L   CO2 26 22 - 32 mmol/L   Glucose, Bld 111 (H) 65 - 99 mg/dL   BUN 18 6 - 20 mg/dL   Creatinine, Ser 1.09 (H) 0.44 - 1.00 mg/dL   Calcium 8.2 (L) 8.9 - 10.3 mg/dL   GFR calc non Af Amer 49 (L) >60 mL/min   GFR calc Af Amer 57 (L) >60 mL/min    Comment: (NOTE) The eGFR has been calculated using the CKD EPI equation. This calculation has not been validated in all clinical situations. eGFR's persistently <60 mL/min signify possible Chronic Kidney Disease.    Anion gap 8 5 - 15  Iron, TIBC and Ferritin Panel     Status: Abnormal   Collection Time: 12/28/16  4:45 PM  Result Value Ref Range   Iron 43 (L) 45 - 160 mcg/dL   TIBC 347 250 - 450 mcg/dL (calc)   %SAT 12 11 - 50 % (calc)   Ferritin 50 20 - 288 ng/mL   Objective  Body mass index is 28.79 kg/m. Wt Readings from Last 3 Encounters:  12/28/16 162 lb 8 oz (73.7 kg)  12/12/16 157 lb (71.2 kg)  10/31/16 160 lb (72.6 kg)   Temp Readings from Last 3 Encounters:  12/28/16 97.9 F (36.6 C) (Oral)  12/12/16 98.1 F (36.7 C) (Oral)  11/04/16 97.8 F (36.6 C)   BP Readings from Last 3 Encounters:  12/28/16 130/68  12/15/16 128/74  12/12/16 126/65   Pulse Readings from Last 3 Encounters:   12/28/16 65  12/15/16 74  12/12/16 69   Physical Exam  Constitutional: She is oriented to person, place, and time and well-developed, well-nourished, and in no distress. Vital signs are normal.  HENT:  Head: Normocephalic and atraumatic.  Mouth/Throat: Oropharynx is clear and moist and mucous membranes are normal.  Eyes: Conjunctivae are normal. Pupils are equal, round, and reactive to light.  Cardiovascular: Normal rate and regular rhythm.  Pulmonary/Chest: Breath sounds normal. She has no wheezes.  Musculoskeletal:  Orthotic intact pt walking with rolling walker  Neurological: She is alert and oriented to person, place, and time.  Walking with rolling walker   Skin: Skin is warm, dry and intact.  Psychiatric: Mood, memory, affect and judgment normal.  Nursing note and vitals reviewed.  Assessment   1. H/o osteoporosis s/p right hip fracture repair 11/01/16 with dislocation s/p repair and currently right groin pain again  2. Asthma/COPD with cough qhs  3. H/o GERD, PUD, hiatal hernia with chronic PPI use x 8 years  4. Chronic pain  5. HTN controlled  6. Anemia  7. HM  Plan  1. On Forteo Will try to get f/u appt moved up with Dr. Roland Rack sooner than 01/22/17 to check on hip, right Check Vit D   2. Just completed Abx and steroids  Refill breo, advised try Albuterol prn qhs for cough  Will also consult with Dr. Lake Bells She does not want to try OTC antitussives for  cough and has tessalon perles she will try  Advised antitussives with codeine not good with other narcotics currently on  3. Refill H2 blocked and PPI  Check ViD, B12 and Mag  4. F/u with specialist  5. Cont meds cont to monitor  6. Check CBC and anemia panel today  7. Had flu shot 10/2016, had prevnar and pna 23 vxs, allergic to Tdap  Shingrix declines   Out of pap age window still has GU organs   Referred for mammogram   EGD/Colonoscopy had 04/02/16 with gastric polyps colon polyps and IH. Reports states f/u in  3 years per pt has tortuous colon and they rec she not have another   Dexa had 08/19/16 +osteoporosis   Reviewed CT chest 12/04/14 +small airway disease and 5 mm nodule LUL unchanged from 2013 consider benign no imaging f/u rec.   Provider: Dr. Olivia Mackie McLean-Scocuzza

## 2016-12-30 ENCOUNTER — Telehealth: Payer: Self-pay | Admitting: Internal Medicine

## 2016-12-30 NOTE — Telephone Encounter (Signed)
Ok to give verbals 

## 2016-12-30 NOTE — Telephone Encounter (Signed)
Verbals given  

## 2016-12-30 NOTE — Telephone Encounter (Signed)
Of course   Thanks TSM

## 2016-12-30 NOTE — Telephone Encounter (Signed)
Copied from [place]. Topic: Quick Communication - See Telephone Encounter >> Dec 30, 2016  2:58 PM Carter, Kerri Emperor wrote: CRM for notification. See Telephone encounter for:  Cefar with [facility] was is calling to request 1 visit this week, 2 times a week for the next 3 weeks.  [phone]  12/30/16.

## 2017-01-01 NOTE — Telephone Encounter (Signed)
Labs have not been resulted yet.  Copied from [place] [phone]. Topic: Inquiry >> Jan 01, 2017 10:39 AM Conception Chancy, NT wrote: Reason for CRM: pt would like a call back about her lab results. She was told he would hear the next day and she has nnot heard back.

## 2017-01-04 ENCOUNTER — Other Ambulatory Visit: Payer: Self-pay | Admitting: Internal Medicine

## 2017-01-04 DIAGNOSIS — E559 Vitamin D deficiency, unspecified: Secondary | ICD-10-CM

## 2017-01-04 MED ORDER — VITAMIN D3 1.25 MG (50000 UT) PO CAPS: 1.0000 | ORAL_CAPSULE | ORAL | 1 refills | Status: DC

## 2017-01-06 ENCOUNTER — Encounter: Payer: Self-pay | Admitting: Pulmonary Disease

## 2017-01-06 ENCOUNTER — Ambulatory Visit: Payer: Medicare Other | Admitting: Pulmonary Disease

## 2017-01-06 VITALS — BP 120/72 | HR 77 | Ht 62.5 in | Wt 163.8 lb

## 2017-01-06 DIAGNOSIS — J449 Chronic obstructive pulmonary disease, unspecified: Secondary | ICD-10-CM | POA: Diagnosis not present

## 2017-01-06 NOTE — Patient Instructions (Addendum)
COPD: Keep taking Breo daily as you are doing Stay as active as possible I'm glad that you had a flu shot We will see you back in 6 months or sooner if needed

## 2017-01-06 NOTE — Progress Notes (Signed)
Subjective:    Patient ID: Kerri Carter, female    DOB: 04-01-1940, 76 y.o.   MRN: [id number]  Synopsis: Kerri Carter established care for COPD at the [facility] office in 08/2011.  Had PFTs in 04/2011 in March 2013 which showed: Ratio 70%, clear obstruction on flow volume loop, FEV1 1.79 L (81% predicted.), Total lung capacity 5.3 L (115% predicted), residual volume 2.49 L (136% predicted), DLCO 84% predicted. She smoked 0.5packs per day for 20 years.    HPI  Chief Complaint  Patient presents with  . Follow-up   Kerri Carter had an exacerbation of her COPD recently and needed augmentin and prednisone from our office. This was her only attack this year. She says that it was this time of year when she had trouble too. She has been feeling better since she took the prednisone and augmentin. She tries to avoid cigarette smoke. She continues to take Breo one puff daily. She says that she is compliant with the Breo, doesn't forget it.  She takes her medicine regulalry.   No cough, no wheezing. Still dyspneic with exertion, but this has been stable. In April she had her gallstone removed and has been dealing with a lot of problems since then (back surgery, hip fracture, then hip dislocation.     Past Medical History:  Diagnosis Date  . Anxiety   . Asthma   . Cataract    s/p b/l repair   . Chicken pox   . CKD (chronic kidney disease)   . CKD (chronic kidney disease), stage III ([place])    a. s/p R nephrectomy.  . Conversion disorder   . COPD (chronic obstructive pulmonary disease) ([place])   . Depression   . Essential hypertension   . GERD (gastroesophageal reflux disease)   . Hyperlipidemia   . Inflammatory arthritis    a. hands/carpal tunnel.  b. Low titer rheumatoid factor. c. Negative anti-CCP antibodies. d. Plaquenil.  . Non-Obstructive CAD    a. 07/2009 Cath ([facility]): nonobs dzs;  b. 03/2011 Cath [facility]): nonobs dzs.  . Osteoarthritis    a. Knees.  . PUD (peptic ulcer disease)   . S/P  right hip fracture    11/01/16 s/p repair  . Spinal stenosis at L4-L5 level    severe with L4/L5 anterolisthesis grade 1 anterolisthesis   . Toxic maculopathy   . Valvular heart disease    a. 07/2015 Echo: EF 55-60%, Mild AI, AS, MR, and TR.     Review of Systems  Constitutional: Negative for activity change, chills and fatigue.  Respiratory: Positive for cough, shortness of breath and wheezing. Negative for stridor.   Cardiovascular: Negative for chest pain, palpitations and leg swelling.       Objective:   Physical Exam  Vitals:   01/06/17 1507  BP: 120/72  Pulse: 77  SpO2: 96%  Weight: 163 lb 12.8 oz (74.3 kg)  Height: 5' 2.5" (1.588 m)  RA  Gen: chronically ill appearing HENT: OP clear, TM's clear, neck supple PULM: CTA B, normal percussion CV: RRR, no mgr, trace edema GI: BS+, soft, nontender Derm: no cyanosis or rash Psyche: normal mood and affect   Records from her November 2018 visit with our nurse practitioner reviewed where she was seen for and treated for a COPD exacerbation with prednisone and Augmentin.  CBC    Component Value Date/Time   WBC 14.0 (H) 12/28/2016 1633   RBC 4.26 12/28/2016 1633   HGB 11.1 (L) 12/28/2016 1633   HGB  11.6 (L) 11/24/2013 1622   HCT 35.5 (L) 12/28/2016 1633   HCT 37.0 11/24/2013 1622   PLT 294.0 12/28/2016 1633   PLT 188 11/24/2013 1622   MCV 83.4 12/28/2016 1633   MCV 85 11/24/2013 1622   MCH 27.3 11/03/2016 0355   MCHC 31.3 12/28/2016 1633   RDW 16.4 (H) 12/28/2016 1633   RDW 14.5 11/24/2013 1622   LYMPHSABS 0.9 (L) 11/02/2016 0321   LYMPHSABS 1.4 07/22/2013 0549   MONOABS 0.8 11/02/2016 0321   MONOABS 0.8 07/22/2013 0549   EOSABS 0.0 11/02/2016 0321   EOSABS 0.3 07/22/2013 0549   BASOSABS 0.1 11/02/2016 0321   BASOSABS 0.0 07/22/2013 0549        Assessment & Plan:   Chronic obstructive pulmonary disease, unspecified COPD type ([place])  Discussion: Aside from her recent exacerbation this has been a stable  interval for Kerri Carter.  She is compliant with Breo.  Most of the problems she has been experiencing in the last several months are related to her deconditioning and recent multiple surgeries.  She is no longer taking methotrexate so we do not need to continue lung function testing unless she has difficulty breathing.  Plan: COPD: Keep taking Breo daily as you are doing Stay as active as possible I'm glad that you had a flu shot We will see you back in 6 months or sooner if needed    Current Outpatient Medications:  .  albuterol (VENTOLIN HFA) 108 (90 Base) MCG/ACT inhaler, Inhale 2 puffs into the lungs every 6 (six) hours as needed., Disp: 1 Inhaler, Rfl: 5 .  benzonatate (TESSALON) 200 MG capsule, take 1 capsule by mouth three times a day if needed for cough, Disp: , Rfl:  .  BYSTOLIC 10 MG tablet, take 1 tablet by mouth once daily, Disp: 30 tablet, Rfl: 3 .  Calcium-Vitamin D-Vitamin K (CALCIUM SOFT CHEWS) 500-500-40 MG-UNT-MCG CHEW, Chew by mouth., Disp: , Rfl:  .  Cholecalciferol (VITAMIN D3) 50000 units CAPS, Take 1 capsule by mouth once a week. Same day, Disp: 12 capsule, Rfl: 1 .  escitalopram (LEXAPRO) 10 MG tablet, Take 1 tablet (10 mg total) daily by mouth., Disp: 30 tablet, Rfl: 2 .  fluticasone furoate-vilanterol (BREO ELLIPTA) 100-25 MCG/INH AEPB, Inhale 1 puff into the lungs daily., Disp: 3 each, Rfl: 3 .  folic acid (FOLVITE) 1 MG tablet, Take 1 mg by mouth daily., Disp: , Rfl:  .  gabapentin (NEURONTIN) 300 MG capsule, TAKE 1 CAPSULE BY MOUTH FOUR TIMES DAILY, Disp: 120 capsule, Rfl: 3 .  lamoTRIgine (LAMICTAL) 100 MG tablet, Take 1 tablet (100 mg total) 2 (two) times daily by mouth., Disp: 60 tablet, Rfl: 2 .  leflunomide (ARAVA) 10 MG tablet, Take 10 mg by mouth daily. , Disp: , Rfl: 0 .  LORazepam (ATIVAN) 0.5 MG tablet, Take 1/2 pill at bed time, Disp: 15 tablet, Rfl: 2 .  lovastatin (MEVACOR) 20 MG tablet, Take 1 tablet (20 mg total) by mouth at bedtime., Disp: 90  tablet, Rfl: 3 .  Melatonin 3 MG TABS, Take 1 tablet (3 mg total) at bedtime by mouth., Disp: 30 tablet, Rfl: 2 .  montelukast (SINGULAIR) 10 MG tablet, take 1 tablet by mouth once daily, Disp: 90 tablet, Rfl: 1 .  multivitamin-lutein (OCUVITE-LUTEIN) CAPS capsule, Take 1 capsule by mouth 2 (two) times daily., Disp: , Rfl:  .  olopatadine (PATANOL) 0.1 % ophthalmic solution, Apply to eye., Disp: , Rfl:  .  oxyCODONE (OXY IR/ROXICODONE) 5 MG  immediate release tablet, Take 1 tablet (5 mg total) by mouth every 6 (six) hours as needed for breakthrough pain., Disp: 5 tablet, Rfl: 0 .  pantoprazole (PROTONIX) 40 MG tablet, take 1 tablet by mouth twice a day as directed, Disp: 60 tablet, Rfl: 0 .  potassium chloride (K-DUR) 10 MEQ tablet, take 1 tablet by mouth once daily, Disp: 30 tablet, Rfl: 4 .  QUEtiapine (SEROQUEL) 25 MG tablet, Take 1 tablet (25 mg total) at bedtime by mouth., Disp: 30 tablet, Rfl: 2 .  RA NASAL ALLERGY 55 MCG/ACT AERO nasal inhaler, instill 2 sprays into each nostril once daily, Disp: , Rfl: 0 .  ranitidine (ZANTAC) 150 MG capsule, Take 1 capsule (150 mg total) by mouth 2 (two) times daily., Disp: 180 capsule, Rfl: 1 .  sucralfate (CARAFATE) 1 g tablet, TAKE 1 TABLET BY MOUTH FOUR TIMES DAILY WITH MEALS AT BEDTIME, Disp: 120 tablet, Rfl: 3 .  Teriparatide, Recombinant, (FORTEO) 600 MCG/2.4ML SOLN, Inject 0.08 mLs (20 mcg total) into the skin daily., Disp: 2.24 mL, Rfl: 0 .  traMADol (ULTRAM) 50 MG tablet, TAKE 1 TABLET BY MOUTH EVERY 6 HOURS IF NEEDED FOR PAIN, Disp: , Rfl:

## 2017-01-08 DIAGNOSIS — Z96649 Presence of unspecified artificial hip joint: Secondary | ICD-10-CM

## 2017-01-08 DIAGNOSIS — T84029A Dislocation of unspecified internal joint prosthesis, initial encounter: Secondary | ICD-10-CM | POA: Insufficient documentation

## 2017-01-08 HISTORY — DX: Presence of unspecified artificial hip joint: Z96.649

## 2017-01-08 HISTORY — DX: Presence of unspecified artificial hip joint: T84.029A

## 2017-01-15 DIAGNOSIS — Z981 Arthrodesis status: Secondary | ICD-10-CM | POA: Insufficient documentation

## 2017-01-15 DIAGNOSIS — Z8781 Personal history of (healed) traumatic fracture: Secondary | ICD-10-CM

## 2017-01-15 HISTORY — DX: Personal history of (healed) traumatic fracture: Z87.81

## 2017-01-15 HISTORY — DX: Arthrodesis status: Z98.1

## 2017-01-19 ENCOUNTER — Ambulatory Visit
Admission: RE | Admit: 2017-01-19 | Discharge: 2017-01-19 | Disposition: A | Discharge status: Home / Self Care | Payer: Medicare Other | Source: Ambulatory Visit | Attending: Internal Medicine | Admitting: Internal Medicine

## 2017-01-19 DIAGNOSIS — Z1231 Encounter for screening mammogram for malignant neoplasm of breast: Secondary | ICD-10-CM | POA: Insufficient documentation

## 2017-01-20 ENCOUNTER — Other Ambulatory Visit: Payer: Self-pay | Admitting: Family Medicine

## 2017-01-21 ENCOUNTER — Ambulatory Visit: Payer: Medicare Other | Admitting: Internal Medicine

## 2017-01-25 NOTE — Telephone Encounter (Signed)
Refilled: 10/22/2015 Last OV: 12/28/2016 Next OV: 02/22/2017

## 2017-02-22 ENCOUNTER — Ambulatory Visit: Payer: Medicare Other | Admitting: Internal Medicine

## 2017-02-22 ENCOUNTER — Encounter: Payer: Self-pay | Admitting: Internal Medicine

## 2017-02-22 VITALS — BP 140/64 | HR 61 | Temp 97.6°F | Ht 62.5 in | Wt 163.0 lb

## 2017-02-22 DIAGNOSIS — L304 Erythema intertrigo: Secondary | ICD-10-CM

## 2017-02-22 DIAGNOSIS — D649 Anemia, unspecified: Secondary | ICD-10-CM

## 2017-02-22 DIAGNOSIS — I10 Essential (primary) hypertension: Secondary | ICD-10-CM

## 2017-02-22 DIAGNOSIS — D72829 Elevated white blood cell count, unspecified: Secondary | ICD-10-CM

## 2017-02-22 MED ORDER — CLOTRIMAZOLE 1 % EX CREA: 1.0000 "application " | TOPICAL_CREAM | Freq: Two times a day (BID) | CUTANEOUS | 0 refills | Status: DC

## 2017-02-22 NOTE — Patient Instructions (Addendum)
Try Zeasorb AF powder over the counter in skin folds to keep you dry  Take care  Follow up in 3-4 months sooner if needed    Leukocytosis Leukocytosis means that a person has more white blood cells than normal. White blood cells are made in the bone marrow. Bone marrow is the spongy tissue inside of bones. The main job of white blood cells is to fight infection. Having too many white blood cells is a common condition. It can develop as a result of many types of medical problems. What are the causes? This condition may be caused by various problems. In some cases, the bone marrow is normal but it is still making too many white blood cells. This could be the result of:  Infection.  Injury.  Physical stress.  Emotional stress.  Surgery.  Allergic reactions.  Tumors that do not start in the blood or bone marrow.  An inherited disease.  Certain medicines.  Pregnancy and labor.  In other cases, a person may have a bone marrow disorder that is causing the body to make too many white blood cells. Bone marrow disorders include:  Leukemia. This is a type of blood cancer.  Myeloproliferative disorders. These disorders cause blood cells to grow abnormally.  What are the signs or symptoms? Often, this condition causes no symptoms. Some people may have symptoms due to the medical problem that is causing their leukocytosis. These symptoms may include:  Bleeding.  Bruising.  Fever.  Night sweats.  Repeated infections.  Weakness.  Weight loss.  How is this diagnosed? This condition is diagnosed with blood tests. It is often found when blood is tested as part of a routine physical exam. You may have other tests to help determine why you have too many white blood cells. These tests may include:  A complete blood count (CBC). This test measures all the types of blood cells in your body.  Chest X-rays, urine tests, or other tests to look for signs of infection.  Bone marrow  aspiration. For this test, a needle is put into your bone. Cells from the bone marrow are removed through the needle, then they are examined under a microscope.  Other tests on the blood or bone marrow sample.  How is this treated? Usually, treatment is not needed for leukocytosis. However, if a disorder is causing your leukocytosis, it will need to be treated. Treatment may include:  Antibiotic medicine if you have a bacterial infection.  Bone marrow transplant. This treatment replaces your diseased bone marrow with healthy cells that will grow new bone marrow.  Chemotherapy or biological therapies such as the use of antibodies. These treatments may be used to kill cancer cells or to decrease the number of white blood cells.  Follow these instructions at home: Medicines  Take over-the-counter and prescription medicines only as told by your health care provider.  If you were prescribed an antibiotic medicine, take it as told by your health care provider. Do not stop taking the antibiotic even if you start to feel better. Eating and drinking  Eat foods that are low in saturated fats and high in fiber. Eat plenty of fruits and vegetables.  Drink enough fluid to keep your urine clear or pale yellow.  Limit your intake of caffeine and alcohol. General instructions  Maintain a healthy weight. Ask your health care provider what weight is best for you.  Do 30 minutes of exercise at least 5 times each week. Check with your health care  provider before you start a new exercise routine.  Do not use tobacco products, including cigarettes, chewing tobacco, or e-cigarettes. If you need help quitting, ask your health care provider.  Keep all follow-up visits as told by your health care provider. This is important. Contact a health care provider if:  You feel weak or more tired than usual.  You develop chills, a cough, or nasal congestion.  You have a fever.  You lose weight without  trying.  You have night sweats.  You bruise easily. Get help right away if:  You bleed more than normal.  You have chest pain.  You have trouble breathing.  You have uncontrolled nausea or vomiting.  You feel dizzy or light-headed. This information is not intended to replace advice given to you by your health care provider. Make sure you discuss any questions you have with your health care provider. Document Released: 01/08/2011 Document Revised: 06/27/2015 Document Reviewed: 07/23/2014 Elsevier Interactive Patient Education  2018 [place] is skin irritation (inflammation) that happens in warm, moist areas of the body. The irritation can cause a rash and make skin raw and itchy. The rash is usually pink or red. It happens mostly between folds of skin or where skin rubs together, such as:  Toes.  Armpits.  Groin.  Belly.  Breasts.  Buttocks.  This condition is not passed from person to person (is not contagious). Follow these instructions at home:  Keep the affected area clean and dry.  Do not scratch your skin.  Stay cool as much as possible. Use an air conditioner or fan, if you can.  Apply over-the-counter and prescription medicines only as told by your doctor.  If you were prescribed an antibiotic medicine, use it as told by your doctor. Do not stop using the antibiotic even if your condition starts to get better.  Keep all follow-up visits as told by your doctor. This is important. How is this prevented?  Stay at a healthy weight.  Keep your feet dry. This is very important if you have diabetes. Wear cotton or wool socks.  Take care of and protect the skin in your groin and butt area as told by your doctor.  Do not wear tight clothes. Wear clothes that: ? Are loose. ? Take away moisture from your body. ? Are made of cotton.  Wear a bra that gives good support, if needed.  Shower and dry yourself fully after being  active.  Keep your blood sugar under control if you have diabetes. Contact a doctor if:  Your symptoms do not get better with treatment.  Your symptoms get worse or they spread.  You notice more redness and warmth.  You have a fever. This information is not intended to replace advice given to you by your health care provider. Make sure you discuss any questions you have with your health care provider. Document Released: 02/21/2010 Document Revised: 06/27/2015 Document Reviewed: 07/23/2014 Elsevier Interactive Patient Education  [employer].

## 2017-02-23 LAB — COMPREHENSIVE METABOLIC PANEL
ALT: 12 U/L (ref 0–35)
AST: 19 U/L (ref 0–37)
Albumin: 4.1 g/dL (ref 3.5–5.2)
Alkaline Phosphatase: 127 U/L — ABNORMAL HIGH (ref 39–117)
BUN: 21 mg/dL (ref 6–23)
CO2: 29 mEq/L (ref 19–32)
Calcium: 9.7 mg/dL (ref 8.4–10.5)
Chloride: 100 mEq/L (ref 96–112)
Creatinine, Ser: 1.23 mg/dL — ABNORMAL HIGH (ref 0.40–1.20)
GFR: 45.31 mL/min — ABNORMAL LOW (ref >60.00)
Glucose, Bld: 79 mg/dL (ref 70–99)
Potassium: 4.9 mEq/L (ref 3.5–5.1)
Sodium: 137 mEq/L (ref 135–145)
Total Bilirubin: 0.3 mg/dL (ref 0.2–1.2)
Total Protein: 7.5 g/dL (ref 6.0–8.3)

## 2017-02-23 LAB — CBC WITH DIFFERENTIAL/PLATELET
Basophils Absolute: 0.1 10*3/uL (ref 0.0–0.1)
Basophils Relative: 0.5 % (ref 0.0–3.0)
Eosinophils Absolute: 0.8 10*3/uL — ABNORMAL HIGH (ref 0.0–0.7)
Eosinophils Relative: 7.2 % — ABNORMAL HIGH (ref 0.0–5.0)
HCT: 41.5 % (ref 36.0–46.0)
Hemoglobin: 13.4 g/dL (ref 12.0–15.0)
Lymphocytes Relative: 33.3 % (ref 12.0–46.0)
Lymphs Abs: 3.5 10*3/uL (ref 0.7–4.0)
MCHC: 32.3 g/dL (ref 30.0–36.0)
MCV: 83.6 fl (ref 78.0–100.0)
Monocytes Absolute: 0.9 10*3/uL (ref 0.1–1.0)
Monocytes Relative: 8.1 % (ref 3.0–12.0)
Neutro Abs: 5.3 10*3/uL (ref 1.4–7.7)
Neutrophils Relative %: 50.9 % (ref 43.0–77.0)
Platelets: 243 10*3/uL (ref 150.0–400.0)
RBC: 4.96 Mil/uL (ref 3.87–5.11)
RDW: 18.3 % — ABNORMAL HIGH (ref 11.5–15.5)
WBC: 10.5 10*3/uL (ref 4.0–10.5)

## 2017-02-23 IMAGING — CT CT RENAL STONE PROTOCOL
2 of 4 series · 16 of 46 positions shown, 18 images · non-contrast
Comparison: 09/26/2015

CLINICAL DATA: Left lower abdominal pain

EXAM:
CT ABDOMEN AND PELVIS WITHOUT CONTRAST
TECHNIQUE: Multidetector CT imaging of the abdomen and pelvis was performed
following the standard protocol without IV contrast.

[Series 2: stone full standard · axial · 0.82mm/px · z∈[-312,+68]mm · 13 of 84 slices shown, 15 images]
[im 4/84  soft-tissue]
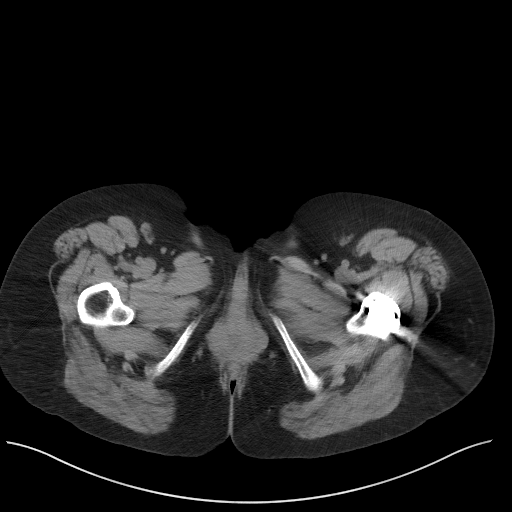
[im 4/84  bone]
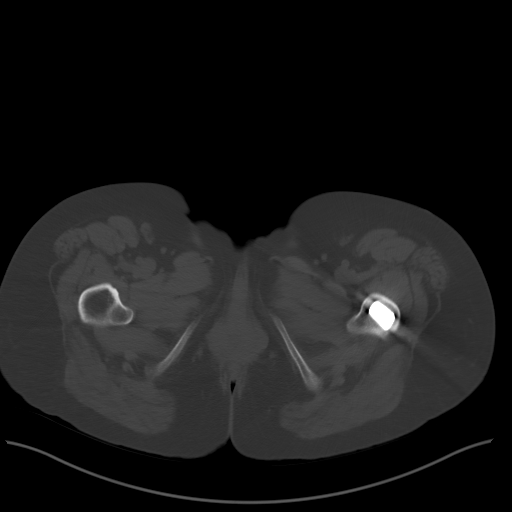
[im 10/84  soft-tissue]
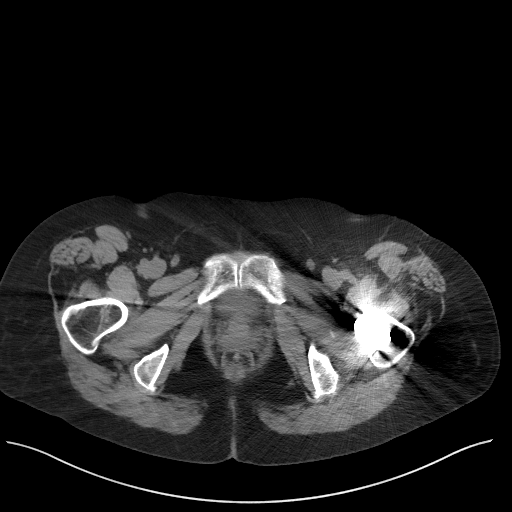
[im 17/84  soft-tissue]
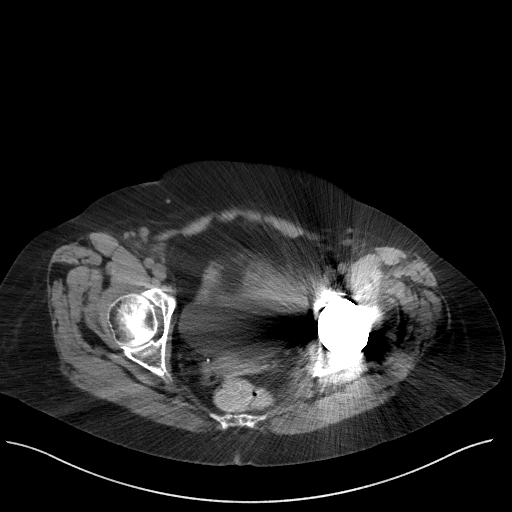
[im 24/84  soft-tissue]
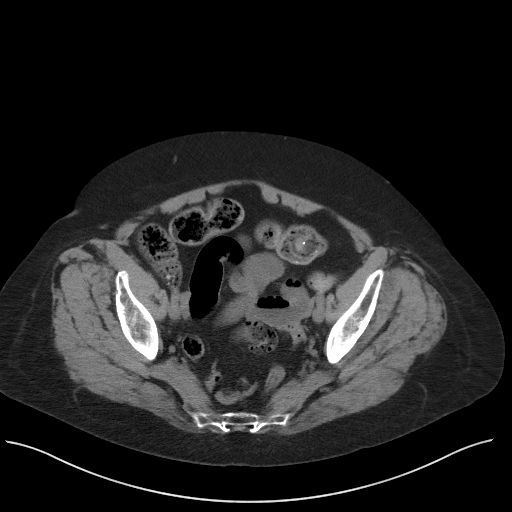
[im 30/84  soft-tissue]
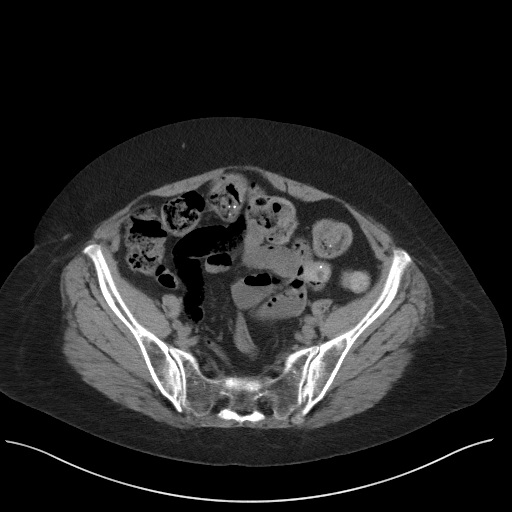
[im 37/84  soft-tissue]
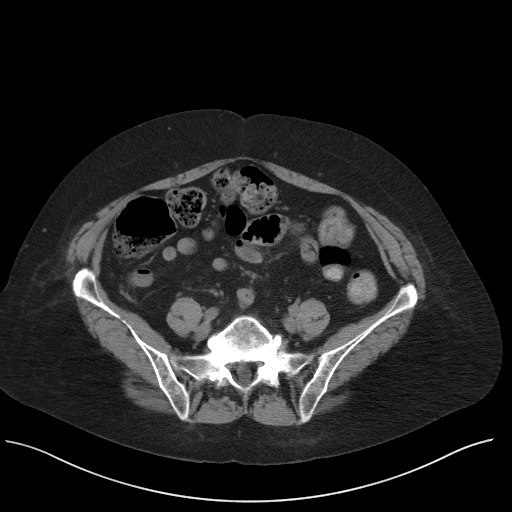
[im 44/84  soft-tissue]
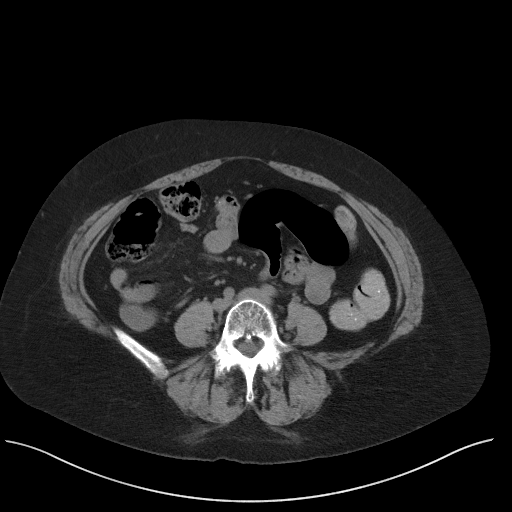
[im 47/84  soft-tissue]
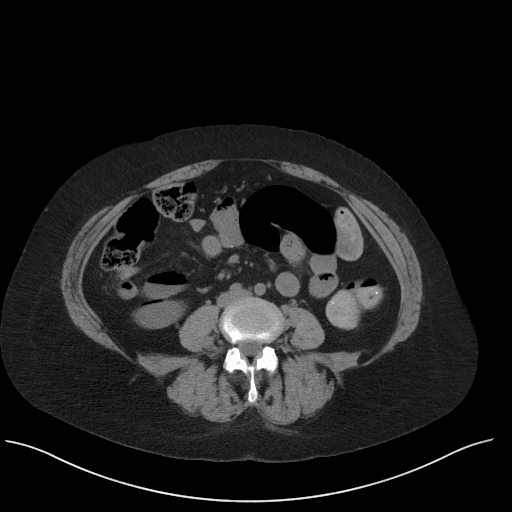
[im 54/84  soft-tissue]
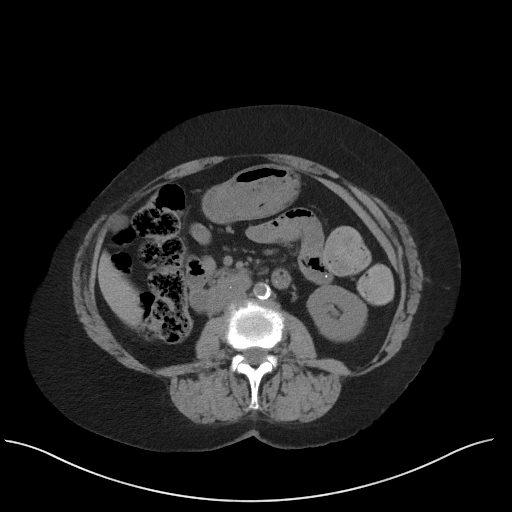
[im 54/84  bone]
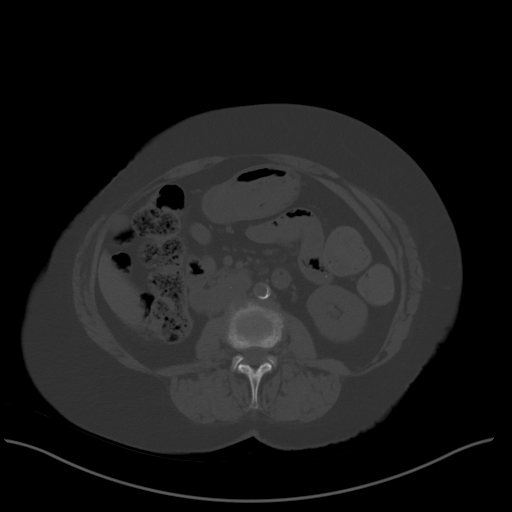
[im 60/84  soft-tissue]
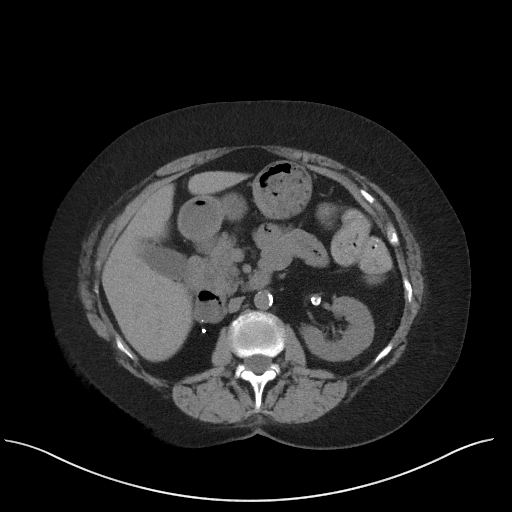
[im 67/84  soft-tissue]
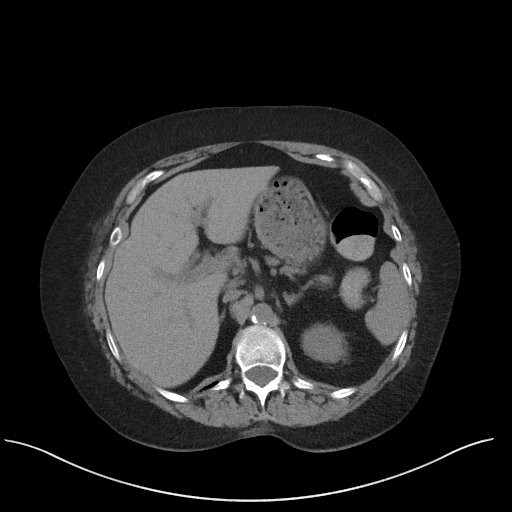
[im 74/84  soft-tissue]
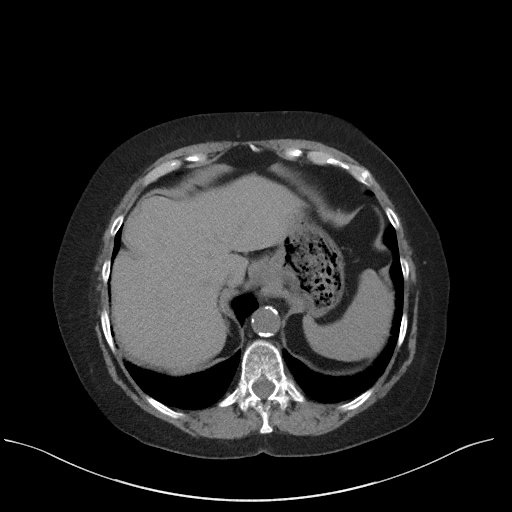
[im 80/84  soft-tissue]
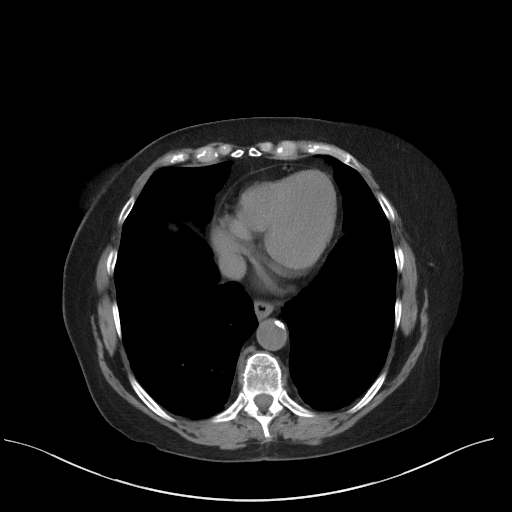

[Series 5: coronal · coronal · 0.71mm/px · 3 of 141 slices shown]
[im 47/141  soft-tissue]
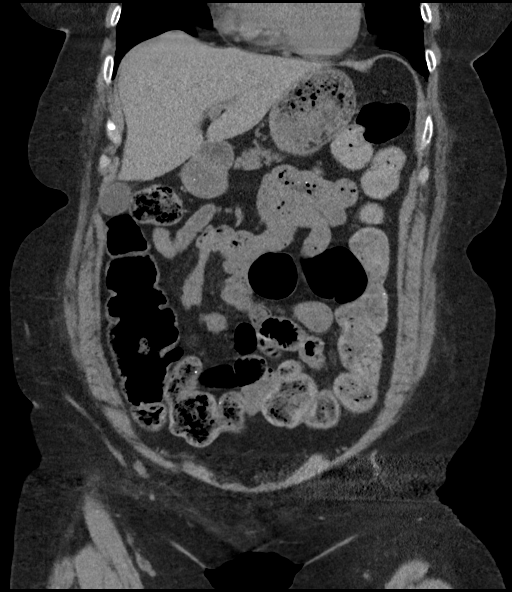
[im 63/141  soft-tissue]
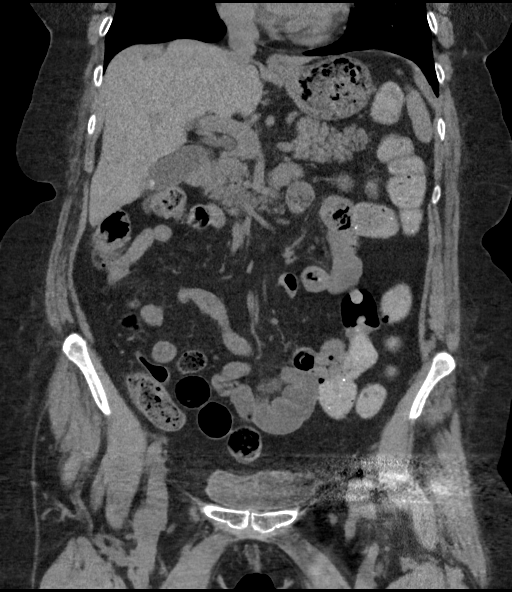
[im 78/141  soft-tissue]
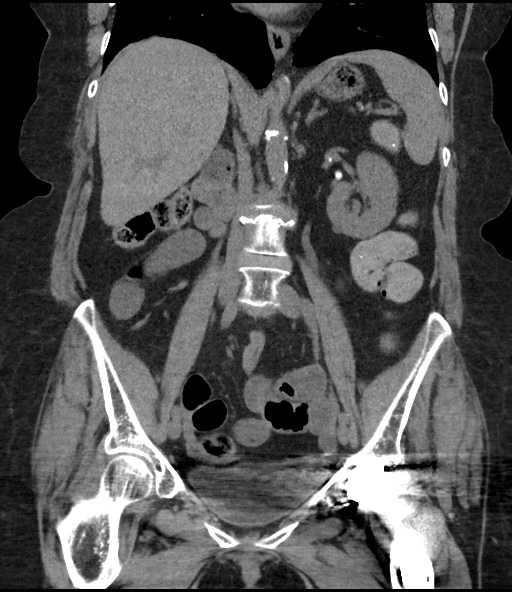

[16 of 46 positions shown; findings below may reference images not displayed]

FINDINGS: Lower chest: Lung bases are clear.

Hepatobiliary: Unenhanced liver is unremarkable.

Gallbladder is notable for layering small gallstones (series
2/images 08/22/2026). No associated inflammatory changes.

No intrahepatic or extrahepatic ductal dilatation.

Pancreas: Within normal limits.

Spleen: Within normal limits.

Adrenals/Urinary Tract: Adrenal glands within normal limits.

Status post right nephrectomy.

Left kidney is within normal limits. 7 mm calcified left renal
artery aneurysm. No renal, ureteral, or bladder calculi. No
hydronephrosis.

Bladder is partially obscured by streak artifact but grossly
unremarkable.

Stomach/Bowel: Stomach is notable for a tiny hiatal hernia.

No evidence of bowel obstruction.

Prior appendectomy.

Moderate colonic stool burden.

Vascular/Lymphatic: No evidence of abdominal aortic aneurysm.

Atherosclerotic calcifications of the abdominal aorta and branch
vessels.

No suspicious abdominopelvic lymphadenopathy.

Reproductive: Status post hysterectomy.

No adnexal masses.

Other: No abdominopelvic ascites.

Musculoskeletal: Degenerative changes of the visualized
thoracolumbar spine.

Left hip arthroplasty, without evidence of complication.
IMPRESSION: No evidence of bowel obstruction.  Prior appendectomy.

Moderate colonic stool burden.

Status post cholecystectomy, right nephrectomy, hysterectomy, and
left hip arthroplasty.

No CT findings for the patient's left lower quadrant abdominal pain.

## 2017-02-26 ENCOUNTER — Encounter: Payer: Self-pay | Admitting: Internal Medicine

## 2017-02-26 DIAGNOSIS — S76011A Strain of muscle, fascia and tendon of right hip, initial encounter: Secondary | ICD-10-CM

## 2017-02-26 HISTORY — DX: Strain of muscle, fascia and tendon of right hip, initial encounter: S76.011A

## 2017-02-26 NOTE — Progress Notes (Signed)
Chief Complaint  Patient presents with  . Follow-up   F/u  1. C/o red irritated skin in right lower abdomen x 2 days  2. She is doing well since fall around Xmas and has seen ortho who stated her hip was doing well and f/u in 1 year no longer wearing leg brace 3. Cough from last visit resolved.  4. HTN controlled on bystolic 10   Review of Systems  Respiratory: Negative for cough and shortness of breath.   Cardiovascular: Negative for chest pain.  Musculoskeletal: Positive for falls. Negative for joint pain.  Skin: Positive for rash.   Past Medical History:  Diagnosis Date  . Anxiety   . Asthma   . Cataract    s/p b/l repair   . Chicken pox   . CKD (chronic kidney disease)   . CKD (chronic kidney disease), stage III ([place])    a. s/p R nephrectomy.  . Conversion disorder   . COPD (chronic obstructive pulmonary disease) ([place])   . Depression   . Essential hypertension   . GERD (gastroesophageal reflux disease)   . Hyperlipidemia   . Inflammatory arthritis    a. hands/carpal tunnel.  b. Low titer rheumatoid factor. c. Negative anti-CCP antibodies. d. Plaquenil.  . Non-Obstructive CAD    a. 07/2009 Cath ([facility]): nonobs dzs;  b. 03/2011 Cath [facility]): nonobs dzs.  . Osteoarthritis    a. Knees.  . PUD (peptic ulcer disease)   . S/P right hip fracture    11/01/16 s/p repair  . Spinal stenosis at L4-L5 level    severe with L4/L5 anterolisthesis grade 1 anterolisthesis   . Toxic maculopathy   . Valvular heart disease    a. 07/2015 Echo: EF 55-60%, Mild AI, AS, MR, and TR.   Past Surgical History:  Procedure Laterality Date  . APPENDECTOMY    . BACK SURGERY    . BUNIONECTOMY    . CATARACT EXTRACTION, BILATERAL    . CESAREAN SECTION     x1  . CHOLECYSTECTOMY N/A 05/11/2016   Procedure: LAPAROSCOPIC CHOLECYSTECTOMY;  Surgeon: Florene Glen, MD;  Location: [facility] ORS;  Service: General;  Laterality: N/A;  . COLONOSCOPY WITH PROPOFOL N/A 04/02/2016   Procedure: COLONOSCOPY WITH  PROPOFOL;  Surgeon: Jonathon Bellows, MD;  Location: [facility] ENDOSCOPY;  Service: Endoscopy;  Laterality: N/A;  . ENDOSCOPIC RETROGRADE CHOLANGIOPANCREATOGRAPHY (ERCP) WITH PROPOFOL N/A 05/08/2016   Procedure: ENDOSCOPIC RETROGRADE CHOLANGIOPANCREATOGRAPHY (ERCP) WITH PROPOFOL;  Surgeon: Lucilla Lame, MD;  Location: [facility] ENDOSCOPY;  Service: Endoscopy;  Laterality: N/A;  . ERCP     with biliary spincterotomy 05/08/16 Dr. Allen Norris for choledocholithiasis   . ESOPHAGEAL DILATION  04/02/2016   Procedure: ESOPHAGEAL DILATION;  Surgeon: Jonathon Bellows, MD;  Location: [facility] ENDOSCOPY;  Service: Endoscopy;;  . ESOPHAGOGASTRODUODENOSCOPY (EGD) WITH PROPOFOL N/A 04/02/2016   Procedure: ESOPHAGOGASTRODUODENOSCOPY (EGD) WITH PROPOFOL;  Surgeon: Jonathon Bellows, MD;  Location: [facility] ENDOSCOPY;  Service: Endoscopy;  Laterality: N/A;  . HIP ARTHROPLASTY Right 11/01/2016   Procedure: ARTHROPLASTY BIPOLAR HIP (HEMIARTHROPLASTY);  Surgeon: Corky Mull, MD;  Location: [facility] ORS;  Service: Orthopedics;  Laterality: Right;  . 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 osteoporosis     noted DEXA 08/19/16   . REPLACEMENT TOTAL KNEE Right   . TONSILLECTOMY    . TOTAL HIP ARTHROPLASTY  12/10/11   [facility] left hip  . TOTAL HIP ARTHROPLASTY Bilateral   . TUBAL LIGATION     Family History  Problem Relation Age of Onset  . Rheum arthritis Mother   . Asthma Mother   . Parkinson's disease Mother   . Heart disease Mother   . Stroke Mother   . Hypertension Mother   . Heart attack Father   . Heart disease Father   . Hypertension Father   . Diabetes Son   . Asthma Sister   . Heart disease Sister   . Lung cancer Sister   . Heart disease Sister   . Heart disease Sister   . Breast cancer Sister   . Heart attack Sister   . Heart disease Brother   . Heart disease Maternal Grandmother   . Diabetes Maternal Grandmother   . Colon cancer Maternal Grandmother   . Cancer Maternal Grandmother         Hodgkins lymphoma  . Heart disease Brother   . Alcohol abuse Brother   . Depression Brother    Social History   Socioeconomic History  . Marital status: Married    Spouse name: richard  . Number of children: 2  . Years of education: Some Coll  . Highest education level: Some college, no degree  Social Needs  . Financial resource strain: Very hard  . Food insecurity - worry: Often true  . Food insecurity - inability: Often true  . Transportation needs - medical: No  . Transportation needs - non-medical: No  Occupational History  . Occupation: Retired    Comment: retired  Tobacco Use  . Smoking status: Former Smoker    Packs/day: 0.50    Years: 20.00    Pack years: 10.00    Types: Cigarettes    Last attempt to quit: 02/02/1974    Years since quitting: 43.0  . Smokeless tobacco: Never Used  Substance and Sexual Activity  . Alcohol use: No  . Drug use: No  . Sexual activity: Not Currently  Other Topics Concern  . Not on file  Social History Narrative   Lives at home in [place] with her husband and grandson.   Right-handed.   6 cups coffee per day.   Current Meds  Medication Sig  . albuterol (VENTOLIN HFA) 108 (90 Base) MCG/ACT inhaler Inhale 2 puffs into the lungs every 6 (six) hours as needed.  . benzonatate (TESSALON) 200 MG capsule take 1 capsule by mouth three times a day if needed for cough  . BYSTOLIC 10 MG tablet take 1 tablet by mouth once daily  . Calcium-Vitamin D-Vitamin K (CALCIUM SOFT CHEWS) [id number] MG-UNT-MCG CHEW Chew by mouth.  . Cholecalciferol (VITAMIN D3) 50000 units CAPS Take 1 capsule by mouth once a week. Same day  . escitalopram (LEXAPRO) 10 MG tablet Take 1 tablet (10 mg total) daily by mouth.  . fluticasone furoate-vilanterol (BREO ELLIPTA) 100-25 MCG/INH AEPB Inhale 1 puff into the lungs daily.  . folic acid (FOLVITE) 1 MG tablet Take 1 mg by mouth daily.  Marland Kitchen gabapentin (NEURONTIN) 300 MG capsule TAKE 1 CAPSULE BY MOUTH FOUR TIMES DAILY  .  lamoTRIgine (LAMICTAL) 100 MG tablet Take 1 tablet (100 mg total) 2 (two) times daily by mouth.  . leflunomide (ARAVA) 10 MG tablet Take 10 mg by mouth daily.   Marland Kitchen LORazepam (ATIVAN) 0.5 MG tablet Take 1/2 pill at bed time  . lovastatin (MEVACOR) 20 MG tablet TAKE 1 TABLET BY MOUTH AT BEDTIME  . Melatonin 3 MG TABS Take 1 tablet (3 mg total) at bedtime by mouth.  . montelukast (SINGULAIR) 10 MG tablet take 1  tablet by mouth once daily  . multivitamin-lutein (OCUVITE-LUTEIN) CAPS capsule Take 1 capsule by mouth 2 (two) times daily.  Marland Kitchen olopatadine (PATANOL) 0.1 % ophthalmic solution Apply to eye.  Marland Kitchen oxyCODONE (OXY IR/ROXICODONE) 5 MG immediate release tablet Take 1 tablet (5 mg total) by mouth every 6 (six) hours as needed for breakthrough pain.  . pantoprazole (PROTONIX) 40 MG tablet take 1 tablet by mouth twice a day as directed  . potassium chloride (K-DUR) 10 MEQ tablet take 1 tablet by mouth once daily  . QUEtiapine (SEROQUEL) 25 MG tablet Take 1 tablet (25 mg total) at bedtime by mouth.  . RA NASAL ALLERGY 55 MCG/ACT AERO nasal inhaler instill 2 sprays into each nostril once daily  . ranitidine (ZANTAC) 150 MG capsule Take 1 capsule (150 mg total) by mouth 2 (two) times daily.  . sucralfate (CARAFATE) 1 g tablet TAKE 1 TABLET BY MOUTH FOUR TIMES DAILY WITH MEALS AT BEDTIME  . Teriparatide, Recombinant, (FORTEO) 600 MCG/2.4ML SOLN Inject 0.08 mLs (20 mcg total) into the skin daily.  . traMADol (ULTRAM) 50 MG tablet TAKE 1 TABLET BY MOUTH EVERY 6 HOURS IF NEEDED FOR PAIN   Allergies  Allergen Reactions  . Ceftin [Cefuroxime Axetil] Anaphylaxis    Other reaction(s): Other (See Comments) REACTION: tongue and throat swell Other Reaction: TONGUE AND THROAT SWELLING REACTION: tongue and throat swell  . Lisinopril Anaphylaxis    REACTION: tongue and throat swelling (onset 10-10-09)  . Morphine Other (See Comments)    Per patient, low blood pressure issues, following morphine, that requires  action to raise it back up. Drop in BP - can take small infrequent doses  . Sulfasalazine Anaphylaxis    Swelling tongue, lips, and jaws  . Aspirin Other (See Comments)    Sulfasalazine allergy cross reacts  . Antihistamines, Chlorpheniramine-Type     REACTION: makes pt hyper  . Antivert [Meclizine Hcl]     Other reaction(s): Unknown REACTION: bladder will not empty REACTION: bladder will not empty  . Decongestant [Pseudoephedrine Hcl]     Other reaction(s): Unknown REACTION: makes pt hyper REACTION: makes pt hyper  . Doxycycline     REACTION: GI upset  . Morphine And Related Other (See Comments)    Per patient, low blood pressure issues, following morphine, that requires action to raise it back up.  . Polymyxin B     Medication was in eye drops.  . Pseudoephedrine Hcl Other (See Comments)    REACTION: makes pt hyper  . Sulfa Antibiotics     Face swelling  . Xarelto [Rivaroxaban]     Other reaction(s): Other (See Comments) GI bleed Stomach burning, bleeding, and tar in stool GI bleed  . Adhesive [Tape] Rash  . Iodine Hives and Rash  . Levaquin [Levofloxacin In D5w] Rash  . Tetanus Toxoid Adsorbed Other (See Comments)    REACTION: rash, fever, hot to touch at injection site  . Tetanus Toxoids     REACTION: rash, fever, hot to touch at injection site   Recent Results (from the past 2160 hour(s))  CBC     Status: Abnormal   Collection Time: 12/28/16  4:33 PM  Result Value Ref Range   WBC 14.0 (H) 4.0 - 10.5 K/uL   RBC 4.26 3.87 - 5.11 Mil/uL   Platelets 294.0 150.0 - 400.0 K/uL   Hemoglobin 11.1 (L) 12.0 - 15.0 g/dL   HCT 35.5 (L) 36.0 - 46.0 %   MCV 83.4 78.0 - 100.0 fl  MCHC 31.3 30.0 - 36.0 g/dL   RDW 16.4 (H) 11.5 - 15.5 %  VITAMIN D 25 Hydroxy (Vit-D Deficiency, Fractures)     Status: Abnormal   Collection Time: 12/28/16  4:33 PM  Result Value Ref Range   VITD 17.71 (L) 30.00 - 100.00 ng/mL  Magnesium     Status: None   Collection Time: 12/28/16  4:33 PM    Result Value Ref Range   Magnesium 2.1 1.5 - 2.5 mg/dL  B12     Status: None   Collection Time: 12/28/16  4:33 PM  Result Value Ref Range   Vitamin B-12 293 211 - 911 pg/mL  Iron, TIBC and Ferritin Panel     Status: Abnormal   Collection Time: 12/28/16  4:45 PM  Result Value Ref Range   Iron 43 (L) 45 - 160 mcg/dL   TIBC 347 250 - 450 mcg/dL (calc)   %SAT 12 11 - 50 % (calc)   Ferritin 50 20 - 288 ng/mL  Comprehensive metabolic panel     Status: Abnormal   Collection Time: 02/22/17  4:56 PM  Result Value Ref Range   Sodium 137 135 - 145 mEq/L   Potassium 4.9 3.5 - 5.1 mEq/L   Chloride 100 96 - 112 mEq/L   CO2 29 19 - 32 mEq/L   Glucose, Bld 79 70 - 99 mg/dL   BUN 21 6 - 23 mg/dL   Creatinine, Ser 1.23 (H) 0.40 - 1.20 mg/dL   Total Bilirubin 0.3 0.2 - 1.2 mg/dL   Alkaline Phosphatase 127 (H) 39 - 117 U/L   AST 19 0 - 37 U/L   ALT 12 0 - 35 U/L   Total Protein 7.5 6.0 - 8.3 g/dL   Albumin 4.1 3.5 - 5.2 g/dL   Calcium 9.7 8.4 - 10.5 mg/dL   GFR 45.31 (L) >60.00 mL/min  CBC with Differential/Platelet     Status: Abnormal   Collection Time: 02/22/17  4:56 PM  Result Value Ref Range   WBC 10.5 4.0 - 10.5 K/uL   RBC 4.96 3.87 - 5.11 Mil/uL   Hemoglobin 13.4 12.0 - 15.0 g/dL   HCT 41.5 36.0 - 46.0 %   MCV 83.6 78.0 - 100.0 fl   MCHC 32.3 30.0 - 36.0 g/dL   RDW 18.3 (H) 11.5 - 15.5 %   Platelets 243.0 150.0 - 400.0 K/uL   Neutrophils Relative % 50.9 43.0 - 77.0 %   Lymphocytes Relative 33.3 12.0 - 46.0 %   Monocytes Relative 8.1 3.0 - 12.0 %   Eosinophils Relative 7.2 (H) 0.0 - 5.0 %   Basophils Relative 0.5 0.0 - 3.0 %   Neutro Abs 5.3 1.4 - 7.7 K/uL   Lymphs Abs 3.5 0.7 - 4.0 K/uL   Monocytes Absolute 0.9 0.1 - 1.0 K/uL   Eosinophils Absolute 0.8 (H) 0.0 - 0.7 K/uL   Basophils Absolute 0.1 0.0 - 0.1 K/uL   Objective  Body mass index is 29.34 kg/m. Wt Readings from Last 3 Encounters:  02/22/17 163 lb (73.9 kg)  01/06/17 163 lb 12.8 oz (74.3 kg)  12/28/16 162 lb 8  oz (73.7 kg)   Temp Readings from Last 3 Encounters:  02/22/17 97.6 F (36.4 C) (Oral)  12/28/16 97.9 F (36.6 C) (Oral)  12/12/16 98.1 F (36.7 C) (Oral)   BP Readings from Last 3 Encounters:  02/22/17 140/64  01/06/17 120/72  12/28/16 130/68   Pulse Readings from Last 3 Encounters:  02/22/17 61  01/06/17  77  12/28/16 65   O2 sat room air 94%   Physical Exam  Constitutional: She is oriented to person, place, and time and well-developed, well-nourished, and in no distress. Vital signs are normal.  HENT:  Head: Normocephalic and atraumatic.  Eyes: Conjunctivae are normal. Pupils are equal, round, and reactive to light.  Cardiovascular: Normal rate, regular rhythm and normal heart sounds.  Pulmonary/Chest: Effort normal and breath sounds normal.  Abdominal:    Neurological: She is alert and oriented to person, place, and time. Gait normal. Gait normal.  Skin: Skin is warm and dry. Rash noted.  +intertrigo right lower pannus   Psychiatric: Mood, memory, affect and judgment normal.  Nursing note and vitals reviewed.   Assessment   1. intertigo 2. HM  3. HTN controlled 4. Leukocytosis anemia   Plan  1. Clotrimazole bid until well then Zeasorb AF Powder OTC to keep dry  2. Had flu, prevnar, pna 23, allergic to Tdap, declines shingrix   mammo 01/19/17 negative  Colonoscopy 04/02/16 multiple polyps but tortuous colon GI does not want to do any further  Out of age window pap  DEXA 08/19/16 +osteoporosis on Forteo will need to repeat 1 year after Forteo use to see improvement rheumatology following.  Seeing dermatology in [place] Ask about next eye exam at f/u   Check TSH, UA, A1C other labs at f/u lipid due 06/2017.   3. Check CMET  Cont Bystolic 10 mg qd  4. Check CBC today for leukocytosis history and anemia pt taking iron 325 mg qd   Provider: Dr. Olivia Mackie McLean-Scocuzza-Internal Medicine

## 2017-03-01 ENCOUNTER — Ambulatory Visit (INDEPENDENT_AMBULATORY_CARE_PROVIDER_SITE_OTHER): Payer: Medicare Other

## 2017-03-01 VITALS — BP 118/70 | HR 76 | Temp 98.3°F | Resp 14 | Ht 62.5 in | Wt 164.8 lb

## 2017-03-01 DIAGNOSIS — Z0000 Encounter for general adult medical examination without abnormal findings: Secondary | ICD-10-CM

## 2017-03-01 NOTE — Patient Instructions (Addendum)
  Ms. Colgate , Thank you for taking time to come for your Medicare Wellness Visit. I appreciate your ongoing commitment to your health goals. Please review the following plan we discussed and let me know if I can assist you in the future.   Follow up with Dr. Terese Door as needed.    Bring a copy of your [place] and/or Living Will to be scanned into chart.  Have a great day!  These are the goals we discussed: Goals    . DIET - REDUCE SUGAR INTAKE     Low carb foods    . Increase physical activity     Chair exercises       This is a list of the screening recommended for you and due dates:  Health Maintenance  Topic Date Due  . Hemoglobin A1C  06/26/2017*  . Mammogram  01/20/2019  . Colon Cancer Screening  04/03/2026  . Flu Shot  Completed  . DEXA scan (bone density measurement)  Completed  . Pneumonia vaccines  Completed  . Complete foot exam   Discontinued  . Eye exam for diabetics  Discontinued  . Urine Protein Check  Discontinued  *Topic was postponed. The date shown is not the original due date.

## 2017-03-01 NOTE — Progress Notes (Signed)
Subjective:   Kerri Carter is a 77 y.o. female who presents for Medicare Annual (Subsequent) preventive examination.  Review of Systems:  Cardiac Risk Factors include: advanced age (>44men, >27 women);hypertension   No ROS.  Medicare Wellness Visit. Additional risk factors are reflected in the social history.    Objective:     Vitals: BP 118/70 (BP Location: Left Arm, Patient Position: Sitting, Cuff Size: Normal)   Pulse 76   Temp 98.3 F (36.8 C) (Oral)   Resp 14   Ht 5' 2.5" (1.588 m)   Wt 164 lb 12.8 oz (74.8 kg)   SpO2 95%   BMI 29.66 kg/m   Body mass index is 29.66 kg/m.  Advanced Directives 03/01/2017 12/12/2016 10/31/2016 06/12/2016 05/08/2016 05/07/2016 05/07/2016  Does Patient Have a Medical Advance Directive? Yes Yes Yes No No No No  Type of Paramedic of [place];Living will [place];Living will [place];Living will - - - -  Does patient want to make changes to medical advance directive? No - Patient declined - No - Patient declined - - - -  Copy of [place] in Chart? No - copy requested - No - copy requested - - - -  Would patient like information on creating a medical advance directive? - - - No - Patient declined No - Patient declined No - Patient declined -  Some encounter information is confidential and restricted. Go to Review Flowsheets activity to see all data.    Tobacco Social History   Tobacco Use  Smoking Status Former Smoker  . Packs/day: 0.50  . Years: 20.00  . Pack years: 10.00  . Types: Cigarettes  . Last attempt to quit: 02/02/1974  . Years since quitting: 43.1  Smokeless Tobacco Never Used     Counseling given: Not Answered   Clinical Intake:  Pre-visit preparation completed: Yes  Pain : No/denies pain     Nutritional Status: BMI 25 -29 Overweight Diabetes: Yes(Followed by PCP)  How often do you need to have someone help you when you read instructions,  pamphlets, or other written materials from your doctor or pharmacy?: 1 - Never  Interpreter Needed?: No     Past Medical History:  Diagnosis Date  . Anxiety   . Asthma   . Cataract    s/p b/l repair   . Chicken pox   . CKD (chronic kidney disease)   . CKD (chronic kidney disease), stage III ([place])    a. s/p R nephrectomy.  . Conversion disorder   . COPD (chronic obstructive pulmonary disease) ([place])   . Depression   . Essential hypertension   . GERD (gastroesophageal reflux disease)   . Hyperlipidemia   . Inflammatory arthritis    a. hands/carpal tunnel.  b. Low titer rheumatoid factor. c. Negative anti-CCP antibodies. d. Plaquenil.  . Non-Obstructive CAD    a. 07/2009 Cath ([facility]): nonobs dzs;  b. 03/2011 Cath [facility]): nonobs dzs.  . Osteoarthritis    a. Knees.  . PUD (peptic ulcer disease)   . S/P right hip fracture    11/01/16 s/p repair  . Spinal stenosis at L4-L5 level    severe with L4/L5 anterolisthesis grade 1 anterolisthesis   . Toxic maculopathy   . Valvular heart disease    a. 07/2015 Echo: EF 55-60%, Mild AI, AS, MR, and TR.   Past Surgical History:  Procedure Laterality Date  . APPENDECTOMY    . BACK SURGERY    .  BUNIONECTOMY    . CATARACT EXTRACTION, BILATERAL    . CESAREAN SECTION     x1  . CHOLECYSTECTOMY N/A 05/11/2016   Procedure: LAPAROSCOPIC CHOLECYSTECTOMY;  Surgeon: Florene Glen, MD;  Location: [facility] ORS;  Service: General;  Laterality: N/A;  . COLONOSCOPY WITH PROPOFOL N/A 04/02/2016   Procedure: COLONOSCOPY WITH PROPOFOL;  Surgeon: Jonathon Bellows, MD;  Location: [facility] ENDOSCOPY;  Service: Endoscopy;  Laterality: N/A;  . ENDOSCOPIC RETROGRADE CHOLANGIOPANCREATOGRAPHY (ERCP) WITH PROPOFOL N/A 05/08/2016   Procedure: ENDOSCOPIC RETROGRADE CHOLANGIOPANCREATOGRAPHY (ERCP) WITH PROPOFOL;  Surgeon: Lucilla Lame, MD;  Location: [facility] ENDOSCOPY;  Service: Endoscopy;  Laterality: N/A;  . ERCP     with biliary spincterotomy 05/08/16 Dr. Allen Norris for choledocholithiasis   .  ESOPHAGEAL DILATION  04/02/2016   Procedure: ESOPHAGEAL DILATION;  Surgeon: Jonathon Bellows, MD;  Location: [facility] ENDOSCOPY;  Service: Endoscopy;;  . ESOPHAGOGASTRODUODENOSCOPY (EGD) WITH PROPOFOL N/A 04/02/2016   Procedure: ESOPHAGOGASTRODUODENOSCOPY (EGD) WITH PROPOFOL;  Surgeon: Jonathon Bellows, MD;  Location: [facility] ENDOSCOPY;  Service: Endoscopy;  Laterality: N/A;  . HIP ARTHROPLASTY Right 11/01/2016   Procedure: ARTHROPLASTY BIPOLAR HIP (HEMIARTHROPLASTY);  Surgeon: Corky Mull, MD;  Location: [facility] ORS;  Service: Orthopedics;  Laterality: Right;  . 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 osteoporosis     noted DEXA 08/19/16   . REPLACEMENT TOTAL KNEE Right   . TONSILLECTOMY    . TOTAL HIP ARTHROPLASTY  12/10/11   [facility] left hip  . TOTAL HIP ARTHROPLASTY Bilateral   . TUBAL LIGATION     Family History  Problem Relation Age of Onset  . Rheum arthritis Mother   . Asthma Mother   . Parkinson's disease Mother   . Heart disease Mother   . Stroke Mother   . Hypertension Mother   . Heart attack Father   . Heart disease Father   . Hypertension Father   . Diabetes Son   . Gout Son   . Asthma Sister   . Heart disease Sister   . Lung cancer Sister   . Heart disease Sister   . Heart disease Sister   . Breast cancer Sister   . Heart attack Sister   . Heart disease Brother   . Heart disease Maternal Grandmother   . Diabetes Maternal Grandmother   . Colon cancer Maternal Grandmother   . Cancer Maternal Grandmother        Hodgkins lymphoma  . Heart disease Brother   . Alcohol abuse Brother   . Depression Brother   . Dementia Son    Social History   Socioeconomic History  . Marital status: Married    Spouse name: richard  . Number of children: 2  . Years of education: Some Coll  . Highest education level: Some college, no degree  Social Needs  . Financial resource strain: Very hard  . Food insecurity - worry: Often true  . Food insecurity -  inability: Often true  . Transportation needs - medical: No  . Transportation needs - non-medical: No  Occupational History  . Occupation: Retired    Comment: retired  Tobacco Use  . Smoking status: Former Smoker    Packs/day: 0.50    Years: 20.00    Pack years: 10.00    Types: Cigarettes    Last attempt to quit: 02/02/1974    Years since quitting: 43.1  . Smokeless tobacco: Never Used  Substance and Sexual Activity  . Alcohol use: No  .  Drug use: No  . Sexual activity: Not Currently  Other Topics Concern  . None  Social History Narrative   Lives at home in [place] with her husband and grandson.   Right-handed.   6 cups coffee per day.    Outpatient Encounter Medications as of 03/01/2017  Medication Sig  . albuterol (VENTOLIN HFA) 108 (90 Base) MCG/ACT inhaler Inhale 2 puffs into the lungs every 6 (six) hours as needed.  . benzonatate (TESSALON) 200 MG capsule take 1 capsule by mouth three times a day if needed for cough  . BYSTOLIC 10 MG tablet take 1 tablet by mouth once daily  . Calcium-Vitamin D-Vitamin K (CALCIUM SOFT CHEWS) [id number] MG-UNT-MCG CHEW Chew by mouth.  . Cholecalciferol (VITAMIN D3) 50000 units CAPS Take 1 capsule by mouth once a week. Same day  . clotrimazole (LOTRIMIN) 1 % cream Apply 1 application topically 2 (two) times daily. Bid to abdomen 7-10 days  . escitalopram (LEXAPRO) 10 MG tablet Take 1 tablet (10 mg total) daily by mouth.  . fluticasone furoate-vilanterol (BREO ELLIPTA) 100-25 MCG/INH AEPB Inhale 1 puff into the lungs daily.  . folic acid (FOLVITE) 1 MG tablet Take 1 mg by mouth daily.  Marland Kitchen gabapentin (NEURONTIN) 300 MG capsule TAKE 1 CAPSULE BY MOUTH FOUR TIMES DAILY  . lamoTRIgine (LAMICTAL) 100 MG tablet Take 1 tablet (100 mg total) 2 (two) times daily by mouth.  . leflunomide (ARAVA) 10 MG tablet Take 10 mg by mouth daily.   Marland Kitchen LORazepam (ATIVAN) 0.5 MG tablet Take 1/2 pill at bed time  . lovastatin (MEVACOR) 20 MG tablet TAKE 1 TABLET BY  MOUTH AT BEDTIME  . Melatonin 3 MG TABS Take 1 tablet (3 mg total) at bedtime by mouth.  . montelukast (SINGULAIR) 10 MG tablet take 1 tablet by mouth once daily  . multivitamin-lutein (OCUVITE-LUTEIN) CAPS capsule Take 1 capsule by mouth 2 (two) times daily.  Marland Kitchen olopatadine (PATANOL) 0.1 % ophthalmic solution Apply to eye.  Marland Kitchen oxyCODONE (OXY IR/ROXICODONE) 5 MG immediate release tablet Take 1 tablet (5 mg total) by mouth every 6 (six) hours as needed for breakthrough pain.  . pantoprazole (PROTONIX) 40 MG tablet take 1 tablet by mouth twice a day as directed  . potassium chloride (K-DUR) 10 MEQ tablet take 1 tablet by mouth once daily  . QUEtiapine (SEROQUEL) 25 MG tablet Take 1 tablet (25 mg total) at bedtime by mouth.  . RA NASAL ALLERGY 55 MCG/ACT AERO nasal inhaler instill 2 sprays into each nostril once daily  . ranitidine (ZANTAC) 150 MG capsule Take 1 capsule (150 mg total) by mouth 2 (two) times daily.  . sucralfate (CARAFATE) 1 g tablet TAKE 1 TABLET BY MOUTH FOUR TIMES DAILY WITH MEALS AT BEDTIME  . Teriparatide, Recombinant, (FORTEO) 600 MCG/2.4ML SOLN Inject 0.08 mLs (20 mcg total) into the skin daily.  . traMADol (ULTRAM) 50 MG tablet TAKE 1 TABLET BY MOUTH EVERY 6 HOURS IF NEEDED FOR PAIN   No facility-administered encounter medications on file as of 03/01/2017.     Activities of Daily Living In your present state of health, do you have any difficulty performing the following activities: 03/01/2017 10/31/2016  Hearing? N -  Vision? N -  Difficulty concentrating or making decisions? N -  Walking or climbing stairs? Y -  Comment Unstaedy gait -  Dressing or bathing? N -  Doing errands, shopping? N Y  Conservation officer, nature and eating ? N -  Using the Toilet? N -  In  the past six months, have you accidently leaked urine? Y -  Comment Followed by Urology, managed with daily brief -  Do you have problems with loss of bowel control? N -  Managing your Medications? N -  Managing your  Finances? N -  Housekeeping or managing your Housekeeping? N -  Comment Pace self -  Some recent data might be hidden    Patient Care Team: McLean-Scocuzza, Nino Glow, MD as PCP - General (Internal Medicine)    Assessment:   This is a routine wellness examination for L[place]ia. The goal of the wellness visit is to assist the patient how to close the gaps in care and create a preventative care plan for the patient.   The roster of all physicians providing medical care to patient is listed in the Snapshot section of the chart.  Taking calcium VIT D as appropriate/Osteoporosis reviewed.    Safety issues reviewed; Smoke and carbon monoxide detectors in the home. No firearms in the home.  Wears seatbelts when driving or riding with others. Patient does wear sunscreen or protective clothing when in direct sunlight. No violence in the home.  Patient is alert, normal appearance, oriented to person/place/and time. Correctly identified the president of the [place], recall of 3/3 words, and performing simple calculations. Displays appropriate judgement and can read correct time from watch face.   No new identified risk were noted.  No failures at ADL's or IADL's.  Ambulates with walker.  BMI- discussed the importance of a healthy diet, water intake and the benefits of aerobic exercise. Educational material provided.   24 hour diet recall: Regular diet.  Encouraged low carb diet.  Daily fluid intake: 1cups of caffeine, 4 cups of water.  Dental- dentures.  Eye- Visual acuity not assessed per patient preference since they have regular follow up with the ophthalmologist.  Wears corrective lenses.  Sleep patterns- Sleeps 6 hours at night, rest is broken most nights. CPAP not in use.  Encouraged to wear CPAP at bedtime.  Health maintenance gaps- closed.  Patient Concerns: None at this time. Follow up with PCP as needed.  Exercise Activities and Dietary recommendations Current Exercise Habits: The  patient does not participate in regular exercise at present  Goals    . DIET - REDUCE SUGAR INTAKE     Low carb foods    . Increase physical activity     Chair exercises       Fall Risk Fall Risk  03/01/2017 02/28/2016 01/30/2016  Falls in the past year? Yes Yes Yes  Number falls in past yr: 2 or more 1 1  Injury with Fall? Yes Yes Yes  Risk Factor Category  High Fall Risk - -  Comment Unsteady gait - -  Risk for fall due to : Impaired balance/gait;History of fall(s) History of fall(s) -  Follow up Education provided;Falls prevention discussed Falls prevention discussed -   Depression Screen PHQ 2/9 Scores 03/01/2017 02/28/2016 01/30/2016  PHQ - 2 Score 0 0 0  Exception Documentation - (No Data) -  Some encounter information is confidential and restricted. Go to Review Flowsheets activity to see all data.     Cognitive Function MMSE - Mini Mental State Exam 03/01/2017 02/28/2016 07/11/2015  Orientation to time 5 5 4   Orientation to Place 5 5 5   Registration 3 3 3   Attention/ Calculation 5 5 5   Recall 3 3 1   Language- name 2 objects 2 2 2   Language- repeat 1 1 1   Language- follow  3 step command 3 3 3   Language- read & follow direction 1 1 1   Write a sentence 1 1 1   Copy design 1 1 1   Total score 30 30 27         Immunization History  Administered Date(s) Administered  . Influenza Split 10/04/2012, 10/25/2013, 10/28/2016  . Influenza Whole 11/03/2011  . Influenza, High Dose Seasonal PF 10/04/2015, 11/03/2016  . Influenza,inj,Quad PF,6+ Mos 10/31/2014  . Influenza-Unspecified 01/02/2010, 11/24/2011, 10/21/2013  . Pneumococcal Conjugate-13 10/21/2013  . Pneumococcal Polysaccharide-23 11/07/2015   Screening Tests Health Maintenance  Topic Date Due  . HEMOGLOBIN A1C  06/26/2017 (Originally 11/06/2016)  . MAMMOGRAM  01/20/2019  . COLONOSCOPY  04/03/2026  . INFLUENZA VACCINE  Completed  . DEXA SCAN  Completed  . PNA vac Low Risk Adult  Completed  . FOOT EXAM   Discontinued  . OPHTHALMOLOGY EXAM  Discontinued  . URINE MICROALBUMIN  Discontinued      Plan:    End of life planning; Advance aging; Advanced directives discussed. Copy of current HCPOA/Living Will requested.    I have personally reviewed and noted the following in the patient's chart:   . Medical and social history . Use of alcohol, tobacco or illicit drugs  . Current medications and supplements . Functional ability and status . Nutritional status . Physical activity . Advanced directives . List of other physicians . Hospitalizations, surgeries, and ER visits in previous 12 months . Vitals . Screenings to include cognitive, depression, and falls . Referrals and appointments  In addition, I have reviewed and discussed with patient certain preventive protocols, quality metrics, and best practice recommendations. A written personalized care plan for preventive services as well as general preventive health recommendations were provided to patient.     Varney Biles, LPN  [id number]

## 2017-03-10 ENCOUNTER — Encounter: Payer: Self-pay | Admitting: Internal Medicine

## 2017-03-10 ENCOUNTER — Ambulatory Visit: Payer: Medicare Other | Admitting: Internal Medicine

## 2017-03-10 VITALS — BP 130/58 | HR 67 | Temp 97.8°F | Ht 62.5 in | Wt 159.2 lb

## 2017-03-10 DIAGNOSIS — H9201 Otalgia, right ear: Secondary | ICD-10-CM | POA: Diagnosis not present

## 2017-03-10 DIAGNOSIS — R112 Nausea with vomiting, unspecified: Secondary | ICD-10-CM | POA: Diagnosis not present

## 2017-03-10 DIAGNOSIS — K529 Noninfective gastroenteritis and colitis, unspecified: Secondary | ICD-10-CM | POA: Diagnosis not present

## 2017-03-10 DIAGNOSIS — H6121 Impacted cerumen, right ear: Secondary | ICD-10-CM | POA: Diagnosis not present

## 2017-03-10 MED ORDER — ONDANSETRON HCL 4 MG PO TABS: 4.0000 mg | ORAL_TABLET | Freq: Three times a day (TID) | ORAL | 0 refills | Status: DC | PRN

## 2017-03-10 MED ORDER — CARBAMIDE PEROXIDE 6.5 % OT SOLN: 5.0000 [drp] | Freq: Two times a day (BID) | OTIC | 1 refills | Status: DC

## 2017-03-10 NOTE — Patient Instructions (Signed)
F/u as sch sooner if needed  Try Debrox drops to right ear and antibacterial drops  Zofran for nausea as needed  Bananas, rice, applesauce, toast, broath, gatorade   Bland Diet A bland diet consists of foods that do not have a lot of fat or fiber. Foods without fat or fiber are easier for the body to digest. They are also less likely to irritate your mouth, throat, stomach, and other parts of your gastrointestinal tract. A bland diet is sometimes called a BRAT diet. What is my plan? Your health care provider or dietitian may recommend specific changes to your diet to prevent and treat your symptoms, such as:  Eating small meals often.  Cooking food until it is soft enough to chew easily.  Chewing your food well.  Drinking fluids slowly.  Not eating foods that are very spicy, sour, or fatty.  Not eating citrus fruits, such as oranges and grapefruit.  What do I need to know about this diet?  Eat a variety of foods from the bland diet food list.  Do not follow a bland diet longer than you have to.  Ask your health care provider whether you should take vitamins. What foods can I eat? Grains  Hot cereals, such as cream of wheat. Bread, crackers, or tortillas made from refined white flour. Rice. Vegetables Canned or cooked vegetables. Mashed or boiled potatoes. Fruits Bananas. Applesauce. Other types of cooked or canned fruit with the skin and seeds removed, such as canned peaches or pears. Meats and Other Protein Sources Scrambled eggs. Creamy peanut butter or other nut butters. Lean, well-cooked meats, such as chicken or fish. Tofu. Soups or broths. Dairy Low-fat dairy products, such as milk, cottage cheese, or yogurt. Beverages Water. Herbal tea. Apple juice. Sweets and Desserts Pudding. Custard. Fruit gelatin. Ice cream. Fats and Oils Mild salad dressings. Canola or olive oil. The items listed above may not be a complete list of allowed foods or beverages. Contact your  dietitian for more options. What foods are not recommended? Foods and ingredients that are often not recommended include:  Spicy foods, such as hot sauce or salsa.  Fried foods.  Sour foods, such as pickled or fermented foods.  Raw vegetables or fruits, especially citrus or berries.  Caffeinated drinks.  Alcohol.  Strongly flavored seasonings or condiments.  The items listed above may not be a complete list of foods and beverages that are not allowed. Contact your dietitian for more information. This information is not intended to replace advice given to you by your health care provider. Make sure you discuss any questions you have with your health care provider. Document Released: 05/13/2015 Document Revised: 06/27/2015 Document Reviewed: 01/31/2014 Elsevier Interactive Patient Education  2018 [place], Adult An earache, or ear pain, can be caused by many things, including:  An infection.  Ear wax buildup.  Ear pressure.  Something in the ear that should not be there (foreign body).  A sore throat.  Tooth problems.  Jaw problems.  Treatment of the earache will depend on the cause. If the cause is not clear or cannot be determined, you may need to watch your symptoms until your earache goes away or until a cause is found. Follow these instructions at home: Pay attention to any changes in your symptoms. Take these actions to help with your pain:  Take or apply over-the-counter and prescription medicines only as told by your health care provider.  If you were prescribed an antibiotic medicine, use  it as told by your health care provider. Do not stop using the antibiotic even if you start to feel better.  Do not put anything in your ear other than medicine that is prescribed by your health care provider.  If directed, apply heat to the affected area as often as told by your health care provider. Use the heat source that your health care provider  recommends, such as a moist heat pack or a heating pad. ? Place a towel between your skin and the heat source. ? Leave the heat on for 20-30 minutes. ? Remove the heat if your skin turns bright red. This is especially important if you are unable to feel pain, heat, or cold. You may have a greater risk of getting burned.  If directed, put ice on the ear: ? Put ice in a plastic bag. ? Place a towel between your skin and the bag. ? Leave the ice on for 20 minutes, 2-3 times a day.  Try resting in an upright position instead of lying down. This may help to reduce pressure in your ear and relieve pain.  Chew gum if it helps to relieve your ear pain.  Treat any allergies as told by your health care provider.  Keep all follow-up visits as told by your health care provider. This is important.  Contact a health care provider if:  Your pain does not improve within 2 days.  Your earache gets worse.  You have new symptoms.  You have a fever. Get help right away if:  You have a severe headache.  You have a stiff neck.  You have trouble swallowing.  You have redness or swelling behind your ear.  You have fluid or blood coming from your ear.  You have hearing loss.  You feel dizzy. This information is not intended to replace advice given to you by your health care provider. Make sure you discuss any questions you have with your health care provider. Document Released: 09/06/2003 Document Revised: 09/17/2015 Document Reviewed: 07/15/2015 Elsevier Interactive Patient Education  [employer].

## 2017-03-10 NOTE — Progress Notes (Signed)
Pre visit review using our clinic review tool, if applicable. No additional management support is needed unless otherwise documented below in the visit note. 

## 2017-03-10 NOTE — Progress Notes (Signed)
Chief Complaint  Patient presents with  . URI  . Diarrhea  . Ear Pain   Sick visit  C/o Sunday diarrhea/vomiting and unsure if she had a fever but felt like it. She has tried Gatorade, Gingerale and crackles and n/v/d have resolved she still does not feel like eating much but will try   Now c/o right ear pain worse since Monday/Tuesday. No medications tried.    Review of Systems  HENT: Positive for ear pain.   Respiratory: Negative for cough and shortness of breath.   Cardiovascular: Negative for chest pain.  Gastrointestinal: Positive for diarrhea, nausea and vomiting. Negative for abdominal pain.       Resolved N//V/D today   Skin: Negative for rash.   Past Medical History:  Diagnosis Date  . Anxiety   . Asthma   . Cataract    s/p b/l repair   . Chicken pox   . CKD (chronic kidney disease)   . CKD (chronic kidney disease), stage III ([place])    a. s/p R nephrectomy.  . Conversion disorder   . COPD (chronic obstructive pulmonary disease) ([place])   . Depression   . Essential hypertension   . GERD (gastroesophageal reflux disease)   . Hyperlipidemia   . Inflammatory arthritis    a. hands/carpal tunnel.  b. Low titer rheumatoid factor. c. Negative anti-CCP antibodies. d. Plaquenil.  . Non-Obstructive CAD    a. 07/2009 Cath ([facility]): nonobs dzs;  b. 03/2011 Cath [facility]): nonobs dzs.  . Osteoarthritis    a. Knees.  . PUD (peptic ulcer disease)   . S/P right hip fracture    11/01/16 s/p repair  . Spinal stenosis at L4-L5 level    severe with L4/L5 anterolisthesis grade 1 anterolisthesis   . Toxic maculopathy   . Valvular heart disease    a. 07/2015 Echo: EF 55-60%, Mild AI, AS, MR, and TR.   Past Surgical History:  Procedure Laterality Date  . APPENDECTOMY    . BACK SURGERY    . BUNIONECTOMY    . CATARACT EXTRACTION, BILATERAL    . CESAREAN SECTION     x1  . CHOLECYSTECTOMY N/A 05/11/2016   Procedure: LAPAROSCOPIC CHOLECYSTECTOMY;  Surgeon: Florene Glen, MD;  Location:  [facility] ORS;  Service: General;  Laterality: N/A;  . COLONOSCOPY WITH PROPOFOL N/A 04/02/2016   Procedure: COLONOSCOPY WITH PROPOFOL;  Surgeon: Jonathon Bellows, MD;  Location: [facility] ENDOSCOPY;  Service: Endoscopy;  Laterality: N/A;  . ENDOSCOPIC RETROGRADE CHOLANGIOPANCREATOGRAPHY (ERCP) WITH PROPOFOL N/A 05/08/2016   Procedure: ENDOSCOPIC RETROGRADE CHOLANGIOPANCREATOGRAPHY (ERCP) WITH PROPOFOL;  Surgeon: Lucilla Lame, MD;  Location: [facility] ENDOSCOPY;  Service: Endoscopy;  Laterality: N/A;  . ERCP     with biliary spincterotomy 05/08/16 Dr. Allen Norris for choledocholithiasis   . ESOPHAGEAL DILATION  04/02/2016   Procedure: ESOPHAGEAL DILATION;  Surgeon: Jonathon Bellows, MD;  Location: [facility] ENDOS[facility];  Service: Endoscopy;;  . ESOPHAGOGASTRODUODENOSCOPY (EGD) WITH PROPOFOL N/A 04/02/2016   Procedure: ESOPHAGOGASTRODUODENOSCOPY (EGD) WITH PROPOFOL;  Surgeon: Jonathon Bellows, MD;  Location: [facility] ENDOSCOPY;  Service: Endoscopy;  Laterality: N/A;  . HIP ARTHROPLASTY Right 11/01/2016   Procedure: ARTHROPLASTY BIPOLAR HIP (HEMIARTHROPLASTY);  Surgeon: Corky Mull, MD;  Location: [facility] ORS;  Service: Orthopedics;  Laterality: Right;  . 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 osteoporosis     noted DEXA 08/19/16   . REPLACEMENT TOTAL KNEE Right   . TONSILLECTOMY    . TOTAL HIP ARTHROPLASTY  12/10/11  [place] left hip  . TOTAL HIP ARTHROPLASTY Bilateral   . TUBAL LIGATION     Family History  Problem Relation Age of Onset  . Rheum arthritis Mother   . Asthma Mother   . Parkinson's disease Mother   . Heart disease Mother   . Stroke Mother   . Hypertension Mother   . Heart attack Father   . Heart disease Father   . Hypertension Father   . Diabetes Son   . Gout Son   . Asthma Sister   . Heart disease Sister   . Lung cancer Sister   . Heart disease Sister   . Heart disease Sister   . Breast cancer Sister   . Heart attack Sister   . Heart disease Brother   . Heart disease Maternal  Grandmother   . Diabetes Maternal Grandmother   . Colon cancer Maternal Grandmother   . Cancer Maternal Grandmother        Hodgkins lymphoma  . Heart disease Brother   . Alcohol abuse Brother   . Depression Brother   . Dementia Son    Social History   Socioeconomic History  . Marital status: Married    Spouse name: richard  . Number of children: 2  . Years of education: Some Coll  . Highest education level: Some college, no degree  Social Needs  . Financial resource strain: Very hard  . Food insecurity - worry: Often true  . Food insecurity - inability: Often true  . Transportation needs - medical: No  . Transportation needs - non-medical: No  Occupational History  . Occupation: Retired    Comment: retired  Tobacco Use  . Smoking status: Former Smoker    Packs/day: 0.50    Years: 20.00    Pack years: 10.00    Types: Cigarettes    Last attempt to quit: 02/02/1974    Years since quitting: 43.1  . Smokeless tobacco: Never Used  Substance and Sexual Activity  . Alcohol use: No  . Drug use: No  . Sexual activity: Not Currently  Other Topics Concern  . Not on file  Social History Narrative   Lives at home in [place] with her husband and grandson.   Right-handed.   6 cups coffee per day.   Current Meds  Medication Sig  . albuterol (VENTOLIN HFA) 108 (90 Base) MCG/ACT inhaler Inhale 2 puffs into the lungs every 6 (six) hours as needed.  . benzonatate (TESSALON) 200 MG capsule take 1 capsule by mouth three times a day if needed for cough  . BYSTOLIC 10 MG tablet take 1 tablet by mouth once daily  . Calcium-Vitamin D-Vitamin K (CALCIUM SOFT CHEWS) 222-979-89 MG-UNT-MCG CHEW Chew by mouth.  . Cholecalciferol (VITAMIN D3) 50000 units CAPS Take 1 capsule by mouth once a week. Same day  . clotrimazole (LOTRIMIN) 1 % cream Apply 1 application topically 2 (two) times daily. Bid to abdomen 7-10 days  . escitalopram (LEXAPRO) 10 MG tablet Take 1 tablet (10 mg total) daily by  mouth.  . fluticasone furoate-vilanterol (BREO ELLIPTA) 100-25 MCG/INH AEPB Inhale 1 puff into the lungs daily.  . folic acid (FOLVITE) 1 MG tablet Take 1 mg by mouth daily.  Marland Kitchen gabapentin (NEURONTIN) 300 MG capsule TAKE 1 CAPSULE BY MOUTH FOUR TIMES DAILY  . lamoTRIgine (LAMICTAL) 100 MG tablet Take 1 tablet (100 mg total) 2 (two) times daily by mouth.  . leflunomide (ARAVA) 10 MG tablet Take 10 mg by mouth daily.   Marland Kitchen  LORazepam (ATIVAN) 0.5 MG tablet Take 1/2 pill at bed time  . lovastatin (MEVACOR) 20 MG tablet TAKE 1 TABLET BY MOUTH AT BEDTIME  . Melatonin 3 MG TABS Take 1 tablet (3 mg total) at bedtime by mouth.  . montelukast (SINGULAIR) 10 MG tablet take 1 tablet by mouth once daily  . multivitamin-lutein (OCUVITE-LUTEIN) CAPS capsule Take 1 capsule by mouth 2 (two) times daily.  Marland Kitchen olopatadine (PATANOL) 0.1 % ophthalmic solution Apply to eye.  Marland Kitchen oxyCODONE (OXY IR/ROXICODONE) 5 MG immediate release tablet Take 1 tablet (5 mg total) by mouth every 6 (six) hours as needed for breakthrough pain.  . pantoprazole (PROTONIX) 40 MG tablet take 1 tablet by mouth twice a day as directed  . potassium chloride (K-DUR) 10 MEQ tablet take 1 tablet by mouth once daily  . QUEtiapine (SEROQUEL) 25 MG tablet Take 1 tablet (25 mg total) at bedtime by mouth.  . RA NASAL ALLERGY 55 MCG/ACT AERO nasal inhaler instill 2 sprays into each nostril once daily  . ranitidine (ZANTAC) 150 MG capsule Take 1 capsule (150 mg total) by mouth 2 (two) times daily.  . sucralfate (CARAFATE) 1 g tablet TAKE 1 TABLET BY MOUTH FOUR TIMES DAILY WITH MEALS AT BEDTIME  . Teriparatide, Recombinant, (FORTEO) 600 MCG/2.4ML SOLN Inject 0.08 mLs (20 mcg total) into the skin daily.  . traMADol (ULTRAM) 50 MG tablet TAKE 1 TABLET BY MOUTH EVERY 6 HOURS IF NEEDED FOR PAIN   Allergies  Allergen Reactions  . Ceftin [Cefuroxime Axetil] Anaphylaxis    Other reaction(s): Other (See Comments) REACTION: tongue and throat swell Other Reaction:  TONGUE AND THROAT SWELLING REACTION: tongue and throat swell  . Lisinopril Anaphylaxis    REACTION: tongue and throat swelling (onset 10-10-09)  . Morphine Other (See Comments)    Per patient, low blood pressure issues, following morphine, that requires action to raise it back up. Drop in BP - can take small infrequent doses  . Sulfasalazine Anaphylaxis    Swelling tongue, lips, and jaws  . Aspirin Other (See Comments)    Sulfasalazine allergy cross reacts  . Antihistamines, Chlorpheniramine-Type     REACTION: makes pt hyper  . Antivert [Meclizine Hcl]     Other reaction(s): Unknown REACTION: bladder will not empty REACTION: bladder will not empty  . Decongestant [Pseudoephedrine Hcl]     Other reaction(s): Unknown REACTION: makes pt hyper REACTION: makes pt hyper  . Doxycycline     REACTION: GI upset  . Morphine And Related Other (See Comments)    Per patient, low blood pressure issues, following morphine, that requires action to raise it back up.  . Polymyxin B     Medication was in eye drops.  . Pseudoephedrine Hcl Other (See Comments)    REACTION: makes pt hyper  . Sulfa Antibiotics     Face swelling  . Xarelto [Rivaroxaban]     Other reaction(s): Other (See Comments) GI bleed Stomach burning, bleeding, and tar in stool GI bleed  . Adhesive [Tape] Rash  . Iodine Hives and Rash  . Levaquin [Levofloxacin In D5w] Rash  . Tetanus Toxoid Adsorbed Other (See Comments)    REACTION: rash, fever, hot to touch at injection site  . Tetanus Toxoids     REACTION: rash, fever, hot to touch at injection site   Recent Results (from the past 2160 hour(s))  CBC     Status: Abnormal   Collection Time: 12/28/16  4:33 PM  Result Value Ref Range   WBC  14.0 (H) 4.0 - 10.5 K/uL   RBC 4.26 3.87 - 5.11 Mil/uL   Platelets 294.0 150.0 - 400.0 K/uL   Hemoglobin 11.1 (L) 12.0 - 15.0 g/dL   HCT 35.5 (L) 36.0 - 46.0 %   MCV 83.4 78.0 - 100.0 fl   MCHC 31.3 30.0 - 36.0 g/dL   RDW 16.4 (H)  11.5 - 15.5 %  VITAMIN D 25 Hydroxy (Vit-D Deficiency, Fractures)     Status: Abnormal   Collection Time: 12/28/16  4:33 PM  Result Value Ref Range   VITD 17.71 (L) 30.00 - 100.00 ng/mL  Magnesium     Status: None   Collection Time: 12/28/16  4:33 PM  Result Value Ref Range   Magnesium 2.1 1.5 - 2.5 mg/dL  B12     Status: None   Collection Time: 12/28/16  4:33 PM  Result Value Ref Range   Vitamin B-12 293 211 - 911 pg/mL  Iron, TIBC and Ferritin Panel     Status: Abnormal   Collection Time: 12/28/16  4:45 PM  Result Value Ref Range   Iron 43 (L) 45 - 160 mcg/dL   TIBC 347 250 - 450 mcg/dL (calc)   %SAT 12 11 - 50 % (calc)   Ferritin 50 20 - 288 ng/mL  Comprehensive metabolic panel     Status: Abnormal   Collection Time: 02/22/17  4:56 PM  Result Value Ref Range   Sodium 137 135 - 145 mEq/L   Potassium 4.9 3.5 - 5.1 mEq/L   Chloride 100 96 - 112 mEq/L   CO2 29 19 - 32 mEq/L   Glucose, Bld 79 70 - 99 mg/dL   BUN 21 6 - 23 mg/dL   Creatinine, Ser 1.23 (H) 0.40 - 1.20 mg/dL   Total Bilirubin 0.3 0.2 - 1.2 mg/dL   Alkaline Phosphatase 127 (H) 39 - 117 U/L   AST 19 0 - 37 U/L   ALT 12 0 - 35 U/L   Total Protein 7.5 6.0 - 8.3 g/dL   Albumin 4.1 3.5 - 5.2 g/dL   Calcium 9.7 8.4 - 10.5 mg/dL   GFR 45.31 (L) >60.00 mL/min  CBC with Differential/Platelet     Status: Abnormal   Collection Time: 02/22/17  4:56 PM  Result Value Ref Range   WBC 10.5 4.0 - 10.5 K/uL   RBC 4.96 3.87 - 5.11 Mil/uL   Hemoglobin 13.4 12.0 - 15.0 g/dL   HCT 41.5 36.0 - 46.0 %   MCV 83.6 78.0 - 100.0 fl   MCHC 32.3 30.0 - 36.0 g/dL   RDW 18.3 (H) 11.5 - 15.5 %   Platelets 243.0 150.0 - 400.0 K/uL   Neutrophils Relative % 50.9 43.0 - 77.0 %   Lymphocytes Relative 33.3 12.0 - 46.0 %   Monocytes Relative 8.1 3.0 - 12.0 %   Eosinophils Relative 7.2 (H) 0.0 - 5.0 %   Basophils Relative 0.5 0.0 - 3.0 %   Neutro Abs 5.3 1.4 - 7.7 K/uL   Lymphs Abs 3.5 0.7 - 4.0 K/uL   Monocytes Absolute 0.9 0.1 - 1.0 K/uL    Eosinophils Absolute 0.8 (H) 0.0 - 0.7 K/uL   Basophils Absolute 0.1 0.0 - 0.1 K/uL   Objective  Body mass index is 28.65 kg/m. Wt Readings from Last 3 Encounters:  03/10/17 159 lb 3.2 oz (72.2 kg)  03/01/17 164 lb 12.8 oz (74.8 kg)  02/22/17 163 lb (73.9 kg)   Temp Readings from Last 3 Encounters:  03/10/17  97.8 F (36.6 C) (Oral)  03/01/17 98.3 F (36.8 C) (Oral)  02/22/17 97.6 F (36.4 C) (Oral)   BP Readings from Last 3 Encounters:  03/10/17 (!) 130/58  03/01/17 118/70  02/22/17 140/64   Pulse Readings from Last 3 Encounters:  03/10/17 67  03/01/17 76  02/22/17 61   O2 sat room air 98%   Physical Exam  Constitutional: She is oriented to person, place, and time and well-developed, well-nourished, and in no distress. Vital signs are normal.  HENT:  Head: Normocephalic and atraumatic.  Right Ear: Hearing and external ear normal.  Left Ear: Hearing, tympanic membrane, external ear and ear canal normal.  Cerumen impaction right ear   Eyes: Pupils are equal, round, and reactive to light.  Cardiovascular: Normal rate, regular rhythm and normal heart sounds.  Pulmonary/Chest: Effort normal and breath sounds normal.  Abdominal: Soft. Bowel sounds are normal. There is no tenderness.  Neurological: She is alert and oriented to person, place, and time. Gait normal. Gait normal.  Skin: Skin is warm, dry and intact.  Psychiatric: Mood, memory, affect and judgment normal.  Nursing note and vitals reviewed.   Assessment   1. Likely gastroenteritis resolving  2. Right ear pain + cerumen impaction no otitis externa sx's unable to visualize canal  3. HM Plan  1. BRAT, gatorade or pedialyte  Prn Zofran for nausea  2. Attempted to get wax out with currette today unsuccessful  Will have pt use Debrox drops for now  If not better after Debrox drops reassess  Prn tylenol  3.  Had flu, prevnar, pna 23, allergic to Tdap, declines shingrix   mammo 01/19/17 negative   Colonoscopy 04/02/16 multiple polyps but tortuous colon GI does not want to do any further  Out of age window pap  DEXA 08/19/16 +osteoporosis on Forteo will need to repeat 1 year after Forteo use to see improvement rheumatology following.   Seeing dermatology in [place] Ask about next eye exam at f/u  Do lipid at f/u 07/2017   Provider: Dr. Olivia Mackie McLean-Scocuz[facility]

## 2017-03-22 ENCOUNTER — Ambulatory Visit: Payer: Medicare Other | Admitting: Psychiatry

## 2017-03-22 ENCOUNTER — Telehealth: Payer: Self-pay | Admitting: Internal Medicine

## 2017-03-22 ENCOUNTER — Encounter: Payer: Self-pay | Admitting: Psychiatry

## 2017-03-22 DIAGNOSIS — F3160 Bipolar disorder, current episode mixed, unspecified: Secondary | ICD-10-CM

## 2017-03-22 DIAGNOSIS — F411 Generalized anxiety disorder: Secondary | ICD-10-CM

## 2017-03-22 MED ORDER — LORAZEPAM 0.5 MG PO TABS: ORAL_TABLET | ORAL | 4 refills | Status: DC

## 2017-03-22 MED ORDER — ESCITALOPRAM OXALATE 10 MG PO TABS: 10.0000 mg | ORAL_TABLET | Freq: Every day | ORAL | 2 refills | Status: DC

## 2017-03-22 MED ORDER — LAMOTRIGINE 100 MG PO TABS: 100.0000 mg | ORAL_TABLET | Freq: Two times a day (BID) | ORAL | 2 refills | Status: DC

## 2017-03-22 MED ORDER — MELATONIN 3 MG PO TABS: 3.0000 mg | ORAL_TABLET | Freq: Every day | ORAL | 2 refills | Status: DC

## 2017-03-22 MED ORDER — QUETIAPINE FUMARATE 25 MG PO TABS: 25.0000 mg | ORAL_TABLET | Freq: Every day | ORAL | 2 refills | Status: DC

## 2017-03-22 NOTE — Telephone Encounter (Signed)
Copied from [place] [id number]. Topic: Quick Communication - See Telephone Encounter >> Mar 22, 2017  3:16 PM Vernona Rieger wrote: CRM for notification. See Telephone encounter for:   03/22/17.  Broadus John from [facility] called and wants to know what her last BP reading was. Last recent A1C1 check too. Call back is [phone] ext [phone]

## 2017-03-22 NOTE — Telephone Encounter (Signed)
05/07/16 5.7 A1C  03/10/17 130/58   TMS

## 2017-03-22 NOTE — Progress Notes (Signed)
Psychiatric MD Progress Note  Patient Identification: Kerri Carter MRN:  [id number] Date of Evaluation:  03/22/2017 Referral Source: PCP Chief Complaint:    Visit Diagnosis:    ICD-10-CM   1. Mixed bipolar I disorder (HCC) F31.60   2. Anxiety state F41.1     History of Present Illness:   Patient is a 77 year old white female who presented for follow up. Patient reported that she has been having difficult time stating her lorazepam into half a pill. She reported that her husband as also been helping her. She reported that she takes several medications at night to help her sleep including Seroquel and lorazepam as well as melatonin. She reported that she wants to go higher on the dose of the lorazepam. We discussed about the medication as it increases the risk of falls as she is also using walker. Patient appeared calm and alert during the interview. She was talking about her husband who sleeps well at night. She reported that she enjoys watching TV in the bed. She appeared calm and alert during the interview. She does not have any acute symptoms at this time.     She appears pleasant and cooperative during the interview.    Associated Signs/Symptoms: Depression Symptoms:  anxiety, (Hypo) Manic Symptoms:  Labiality of Mood, Anxiety Symptoms:  Excessive Worry, Psychotic Symptoms:  none PTSD Symptoms: Negative NA  Past Psychiatric History: H/o conversion disorder.  Previous Psychotropic Medications:  Trazodone  ambien lexapro lamictal   Substance Abuse History in the last 12 months:  No.  Consequences of Substance Abuse: Negative NA  Past Medical History:  Past Medical History:  Diagnosis Date  . Anxiety   . Asthma   . Cataract    s/p b/l repair   . Chicken pox   . CKD (chronic kidney disease)   . CKD (chronic kidney disease), stage III ([place])    a. s/p R nephrectomy.  . Conversion disorder   . COPD (chronic obstructive pulmonary disease) ([place])   . Depression    . Essential hypertension   . GERD (gastroesophageal reflux disease)   . Hyperlipidemia   . Inflammatory arthritis    a. hands/carpal tunnel.  b. Low titer rheumatoid factor. c. Negative anti-CCP antibodies. d. Plaquenil.  . Non-Obstructive CAD    a. 07/2009 Cath ([facility]): nonobs dzs;  b. 03/2011 Cath [facility]): nonobs dzs.  . Osteoarthritis    a. Knees.  . PUD (peptic ulcer disease)   . S/P right hip fracture    11/01/16 s/p repair  . Spinal stenosis at L4-L5 level    severe with L4/L5 anterolisthesis grade 1 anterolisthesis   . Toxic maculopathy   . Valvular heart disease    a. 07/2015 Echo: EF 55-60%, Mild AI, AS, MR, and TR.    Past Surgical History:  Procedure Laterality Date  . APPENDECTOMY    . BACK SURGERY    . BUNIONECTOMY    . CATARACT EXTRACTION, BILATERAL    . CESAREAN SECTION     x1  . CHOLECYSTECTOMY N/A 05/11/2016   Procedure: LAPAROSCOPIC CHOLECYSTECTOMY;  Surgeon: Florene Glen, MD;  Location: [facility] ORS;  Service: General;  Laterality: N/A;  . COLONOSCOPY WITH PROPOFOL N/A 04/02/2016   Procedure: COLONOSCOPY WITH PROPOFOL;  Surgeon: Jonathon Bellows, MD;  Location: [facility] ENDOSCOPY;  Service: Endoscopy;  Laterality: N/A;  . ENDOSCOPIC RETROGRADE CHOLANGIOPANCREATOGRAPHY (ERCP) WITH PROPOFOL N/A 05/08/2016   Procedure: ENDOSCOPIC RETROGRADE CHOLANGIOPANCREATOGRAPHY (ERCP) WITH PROPOFOL;  Surgeon: Lucilla Lame, MD;  Location: [facility] ENDOSCOPY;  Service:  Endoscopy;  Laterality: N/A;  . ERCP     with biliary spincterotomy 05/08/16 Dr. Allen Norris for choledocholithiasis   . ESOPHAGEAL DILATION  04/02/2016   Procedure: ESOPHAGEAL DILATION;  Surgeon: Jonathon Bellows, MD;  Location: [facility] ENDOSCOPY;  Service: Endoscopy;;  . ESOPHAGOGASTRODUODENOSCOPY (EGD) WITH PROPOFOL N/A 04/02/2016   Procedure: ESOPHAGOGASTRODUODENOSCOPY (EGD) WITH PROPOFOL;  Surgeon: Jonathon Bellows, MD;  Location: [facility] ENDOSCOPY;  Service: Endoscopy;  Laterality: N/A;  . HIP ARTHROPLASTY Right 11/01/2016   Procedure: ARTHROPLASTY BIPOLAR HIP  (HEMIARTHROPLASTY);  Surgeon: Corky Mull, MD;  Location: [facility] ORS;  Service: Orthopedics;  Laterality: Right;  . 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 osteoporosis     noted DEXA 08/19/16   . REPLACEMENT TOTAL KNEE Right   . TONSILLECTOMY    . TOTAL HIP ARTHROPLASTY  12/10/11   [facility] left hip  . TOTAL HIP ARTHROPLASTY Bilateral   . TUBAL LIGATION      Family Psychiatric History:  denied  Family History:  Family History  Problem Relation Age of Onset  . Rheum arthritis Mother   . Asthma Mother   . Parkinson's disease Mother   . Heart disease Mother   . Stroke Mother   . Hypertension Mother   . Heart attack Father   . Heart disease Father   . Hypertension Father   . Diabetes Son   . Gout Son   . Asthma Sister   . Heart disease Sister   . Lung cancer Sister   . Heart disease Sister   . Heart disease Sister   . Breast cancer Sister   . Heart attack Sister   . Heart disease Brother   . Heart disease Maternal Grandmother   . Diabetes Maternal Grandmother   . Colon cancer Maternal Grandmother   . Cancer Maternal Grandmother        Hodgkins lymphoma  . Heart disease Brother   . Alcohol abuse Brother   . Depression Brother   . Dementia Son     Social History:   Social History   Socioeconomic History  . Marital status: Married    Spouse name: richard  . Number of children: 2  . Years of education: Some Coll  . Highest education level: Some college, no degree  Social Needs  . Financial resource strain: Very hard  . Food insecurity - worry: Often true  . Food insecurity - inability: Often true  . Transportation needs - medical: No  . Transportation needs - non-medical: No  Occupational History  . Occupation: Retired    Comment: retired  Tobacco Use  . Smoking status: Former Smoker    Packs/day: 0.50    Years: 20.00    Pack years: 10.00    Types: Cigarettes    Last attempt to quit: 02/02/1974    Years since  quitting: 43.1  . Smokeless tobacco: Never Used  Substance and Sexual Activity  . Alcohol use: No  . Drug use: No  . Sexual activity: Not Currently  Other Topics Concern  . Not on file  Social History Narrative   Lives at home in [place] with her husband and grandson.   Right-handed.   6 cups coffee per day.    Additional Social History:  Married Lives with husband, grand son and his fiance.   Allergies:   Allergies  Allergen Reactions  . Ceftin [Cefuroxime Axetil] Anaphylaxis    Other reaction(s): Other (See Comments) REACTION: tongue and throat  swell Other Reaction: TONGUE AND THROAT SWELLING REACTION: tongue and throat swell  . Lisinopril Anaphylaxis    REACTION: tongue and throat swelling (onset 10-10-09)  . Morphine Other (See Comments)    Per patient, low blood pressure issues, following morphine, that requires action to raise it back up. Drop in BP - can take small infrequent doses  . Sulfasalazine Anaphylaxis    Swelling tongue, lips, and jaws  . Aspirin Other (See Comments)    Sulfasalazine allergy cross reacts  . Antihistamines, Chlorpheniramine-Type     REACTION: makes pt hyper  . Antivert [Meclizine Hcl]     Other reaction(s): Unknown REACTION: bladder will not empty REACTION: bladder will not empty  . Decongestant [Pseudoephedrine Hcl]     Other reaction(s): Unknown REACTION: makes pt hyper REACTION: makes pt hyper  . Doxycycline     REACTION: GI upset  . Morphine And Related Other (See Comments)    Per patient, low blood pressure issues, following morphine, that requires action to raise it back up.  . Polymyxin B     Medication was in eye drops.  . Pseudoephedrine Hcl Other (See Comments)    REACTION: makes pt hyper  . Sulfa Antibiotics     Face swelling  . Xarelto [Rivaroxaban]     Other reaction(s): Other (See Comments) GI bleed Stomach burning, bleeding, and tar in stool GI bleed  . Adhesive [Tape] Rash  . Iodine Hives and Rash  .  Levaquin [Levofloxacin In D5w] Rash  . Tetanus Toxoid Adsorbed Other (See Comments)    REACTION: rash, fever, hot to touch at injection site  . Tetanus Toxoids     REACTION: rash, fever, hot to touch at injection site    Metabolic Disorder Labs: Lab Results  Component Value Date   HGBA1C 5.7 (H) 05/07/2016   MPG 117 05/07/2016   No results found for: PROLACTIN Lab Results  Component Value Date   CHOL 124 06/08/2016   TRIG 183.0 (H) 06/08/2016   HDL 44.80 06/08/2016   CHOLHDL 3 06/08/2016   VLDL 36.6 06/08/2016   LDLCALC 43 06/08/2016     Current Medications: Current Outpatient Medications  Medication Sig Dispense Refill  . albuterol (VENTOLIN HFA) 108 (90 Base) MCG/ACT inhaler Inhale 2 puffs into the lungs every 6 (six) hours as needed. 1 Inhaler 5  . benzonatate (TESSALON) 200 MG capsule take 1 capsule by mouth three times a day if needed for cough    . BYSTOLIC 10 MG tablet take 1 tablet by mouth once daily 30 tablet 3  . Calcium-Vitamin D-Vitamin K (CALCIUM SOFT CHEWS) 884-166-06 MG-UNT-MCG CHEW Chew by mouth.    . carbamide peroxide (DEBROX) 6.5 % OTIC solution Place 5 drops into the right ear 2 (two) times daily. X 4-7 days 15 mL 1  . Cholecalciferol (VITAMIN D3) 50000 units CAPS Take 1 capsule by mouth once a week. Same day 12 capsule 1  . clotrimazole (LOTRIMIN) 1 % cream Apply 1 application topically 2 (two) times daily. Bid to abdomen 7-10 days 60 g 0  . escitalopram (LEXAPRO) 10 MG tablet Take 1 tablet (10 mg total) daily by mouth. 30 tablet 2  . fluticasone furoate-vilanterol (BREO ELLIPTA) 100-25 MCG/INH AEPB Inhale 1 puff into the lungs daily. 3 each 3  . folic acid (FOLVITE) 1 MG tablet Take 1 mg by mouth daily.    Marland Kitchen gabapentin (NEURONTIN) 300 MG capsule TAKE 1 CAPSULE BY MOUTH FOUR TIMES DAILY 120 capsule 3  . lamoTRIgine (LAMICTAL) 100  MG tablet Take 1 tablet (100 mg total) 2 (two) times daily by mouth. 60 tablet 2  . leflunomide (ARAVA) 10 MG tablet Take 10 mg  by mouth daily.   0  . LORazepam (ATIVAN) 0.5 MG tablet Take 1/2 pill at bed time 15 tablet 2  . lovastatin (MEVACOR) 20 MG tablet TAKE 1 TABLET BY MOUTH AT BEDTIME 90 tablet 3  . Melatonin 3 MG TABS Take 1 tablet (3 mg total) at bedtime by mouth. 30 tablet 2  . montelukast (SINGULAIR) 10 MG tablet take 1 tablet by mouth once daily 90 tablet 1  . multivitamin-lutein (OCUVITE-LUTEIN) CAPS capsule Take 1 capsule by mouth 2 (two) times daily.    Marland Kitchen olopatadine (PATANOL) 0.1 % ophthalmic solution Apply to eye.    . ondansetron (ZOFRAN) 4 MG tablet Take 1 tablet (4 mg total) by mouth every 8 (eight) hours as needed for nausea or vomiting. 30 tablet 0  . oxyCODONE (OXY IR/ROXICODONE) 5 MG immediate release tablet Take 1 tablet (5 mg total) by mouth every 6 (six) hours as needed for breakthrough pain. 5 tablet 0  . pantoprazole (PROTONIX) 40 MG tablet take 1 tablet by mouth twice a day as directed 60 tablet 0  . potassium chloride (K-DUR) 10 MEQ tablet take 1 tablet by mouth once daily 30 tablet 4  . QUEtiapine (SEROQUEL) 25 MG tablet Take 1 tablet (25 mg total) at bedtime by mouth. 30 tablet 2  . RA NASAL ALLERGY 55 MCG/ACT AERO nasal inhaler instill 2 sprays into each nostril once daily  0  . ranitidine (ZANTAC) 150 MG capsule Take 1 capsule (150 mg total) by mouth 2 (two) times daily. 180 capsule 1  . sucralfate (CARAFATE) 1 g tablet TAKE 1 TABLET BY MOUTH FOUR TIMES DAILY WITH MEALS AT BEDTIME 120 tablet 3  . Teriparatide, Recombinant, (FORTEO) 600 MCG/2.4ML SOLN Inject 0.08 mLs (20 mcg total) into the skin daily. 2.24 mL 0  . traMADol (ULTRAM) 50 MG tablet TAKE 1 TABLET BY MOUTH EVERY 6 HOURS IF NEEDED FOR PAIN     No current facility-administered medications for this visit.     Neurologic: Headache: No Seizure: No Paresthesias:No  Musculoskeletal: Strength & Muscle Tone: within normal limits Gait & Station: normal Patient leans: N/A  Psychiatric Specialty Exam: ROS  There were no  vitals taken for this visit.There is no height or weight on file to calculate BMI.  General Appearance: Casual  Eye Contact:  Fair  Speech:  Clear and Coherent  Volume:  Normal  Mood:  Anxious and Depressed  Affect:  Appropriate  Thought Process:  Coherent  Orientation:  Full (Time, Place, and Person)  Thought Content:  WDL  Suicidal Thoughts:  No  Homicidal Thoughts:  No  Memory:  Immediate;   Fair Recent;   Fair Remote;   Fair  Judgement:  Fair  Insight:  Fair  Psychomotor Activity:  Normal  Concentration:  Concentration: Fair and Attention Span: Fair  Recall:  [employer] of Knowledge:Fair  Language: Fair  Akathisia:  No  Handed:  Right  AIMS (if indicated):    Assets:  Communication Skills Desire for Improvement Physical Health  ADL's:  Intact  Cognition: WNL  Sleep:  poor    Treatment Plan Summary: Medication management   Discussed with patient about her medications. I will adjust her medications as follows.  Continue lamotrigine 100 mg by mouth twice a day Continue  Lexapro 10 mg every morning Continue  lorazepam 0.5 mg -  Advised patient to take half  at night at bedtime and she demonstrated understanding. I will advise the pharmacy to split the pill into half for the patient and made a note on her prescription  Continue  Seroquel 25 mg by mouth daily at bedtime for mood symptoms and depression. Melatonin 3 mg by mouth daily at bedtime when necessary and she agreed with the plan.   Discussed with her about the side effects in detail and she demonstrated understanding.  Follow-up in  3 months or earlier depending on her symptoms.   More than 50% of the time spent in psychoeducation, counseling and coordination of care.    This note was generated in part or whole with voice recognition software. Voice regonition is usually quite accurate but there are transcription errors that can and very often do occur. I apologize for any typographical errors that were not  detected and corrected.    Rainey Pines, MD 2/18/201910:44 AM

## 2017-03-22 NOTE — Telephone Encounter (Signed)
Please advise 

## 2017-03-22 NOTE — Telephone Encounter (Signed)
Ok to give to [facility]?

## 2017-03-23 NOTE — Telephone Encounter (Signed)
Left message for Kerri Carter to return call to inform him of patients A1C and BP.

## 2017-03-29 NOTE — Telephone Encounter (Signed)
Unable to reach joesph today. Left VVm for him to return call back.

## 2017-03-31 NOTE — Telephone Encounter (Signed)
Closing note due to not having a return call back.

## 2017-03-31 NOTE — Telephone Encounter (Signed)
Error

## 2017-04-01 ENCOUNTER — Telehealth: Payer: Self-pay | Admitting: Internal Medicine

## 2017-04-01 NOTE — Telephone Encounter (Signed)
Copied from [place]. Topic: Quick Communication - Rx Refill/Question >> Apr 01, 2017  3:47 PM Yvette Rack wrote: Medication: montelukast (SINGULAIR) 10 MG tablet   Has the patient contacted their pharmacy? Yes.  The pharmacy told her that she didn't have anymore refill on medicine   (Agent: If no, request that the patient contact the pharmacy for the refill Preferred Pharmacy (with phone number or street name): [id number] - [place], [place] [phone] (Phone) [phone] (Fax     Agent: Please be advised that RX refills may take up to 3 business days. We ask that you follow-up with your pharmacy.

## 2017-04-02 ENCOUNTER — Other Ambulatory Visit: Payer: Self-pay | Admitting: *Deleted

## 2017-04-02 MED ORDER — MONTELUKAST SODIUM 10 MG PO TABS: 10.0000 mg | ORAL_TABLET | Freq: Every day | ORAL | 0 refills | Status: DC

## 2017-05-06 IMAGING — MR MR 3D RECON AT SCANNER
13 of 20 series · 29 of 48 positions shown · IV contrast (7mL multihance)
Comparison: CT from 02/25/2016

CLINICAL DATA: Right upper quadrant abdominal pain. Elevated lipase
and bilirubin

EXAM:
MRI ABDOMEN WITHOUT AND WITH CONTRAST (INCLUDING MRCP)
TECHNIQUE: Multiplanar multisequence MR imaging of the abdomen was performed
both before and after the administration of intravenous contrast.
Heavily T2-weighted images of the biliary and pancreatic ducts were
obtained, and three-dimensional MRCP images were rendered by post
processing.
CONTRAST:  7mL MULTIHANCE GADOBENATE DIMEGLUMINE 529 MG/ML IV SOLN

[Series 3: T2 fat-sat · axial · 7.0mm · 0.74mm/px · z∈[+25,+222]mm · 2 of 25 slices shown]
[im 1/25]
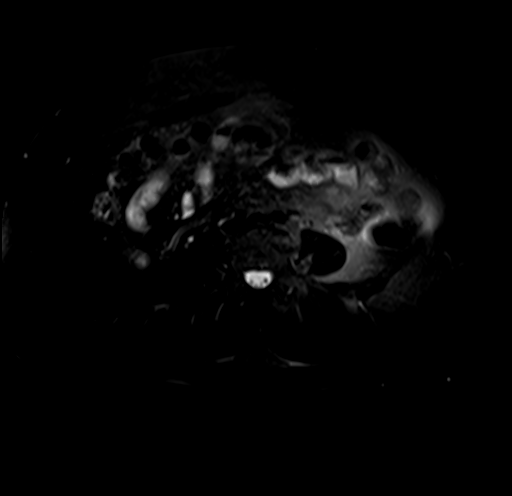
[im 25/25]
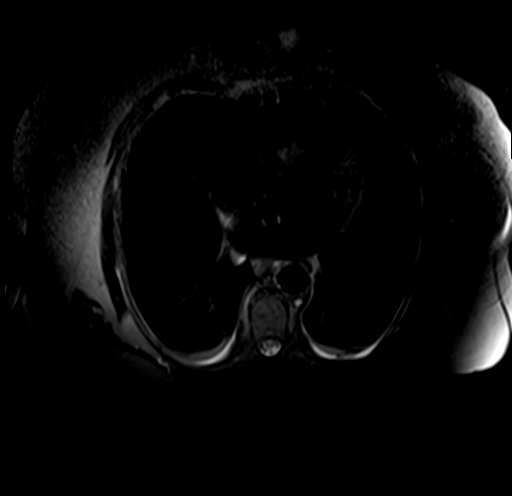

[Series 4: T2 · axial · 7.0mm · 0.74mm/px · z∈[+24,+220]mm · 2 of 25 slices shown]
[im 1/25]
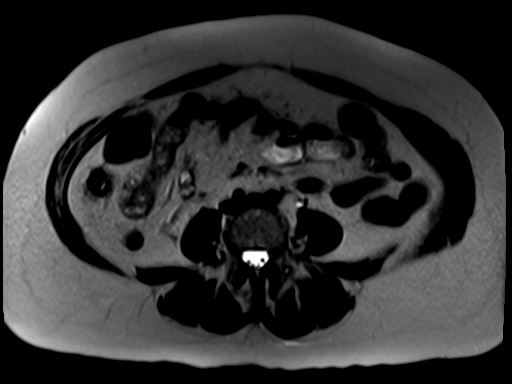
[im 25/25]
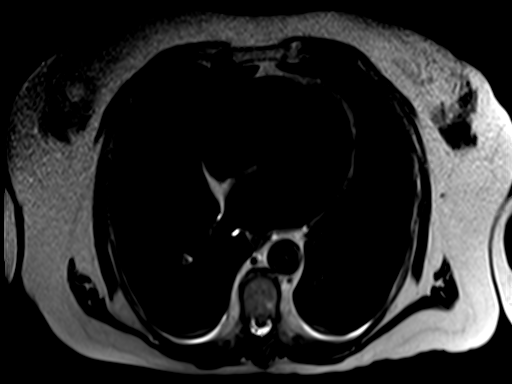

[Series 6: DWI · axial · 6.0mm · 1.77mm/px · z∈[+9,+227]mm · 4 of 96 slices shown]
[im 1/96]
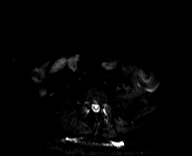
[im 32/96]
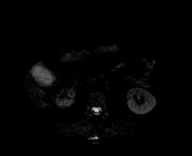
[im 64/96]
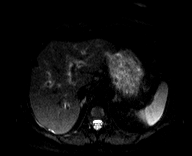
[im 96/96]
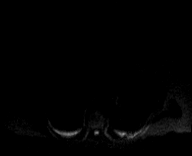

[Series 7: ax dwi_adc · axial · 6.0mm · 1.77mm/px · 1 of 32 slices shown]
[im 1/32]
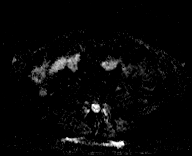

[Series 8: cor tru fisp · coronal · 4.0mm · 0.76mm/px · 2 of 50 slices shown]
[im 1/50]
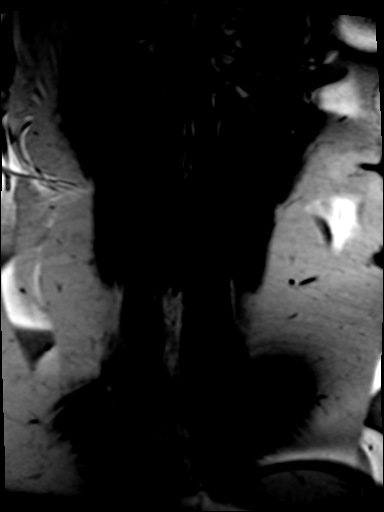
[im 50/50]
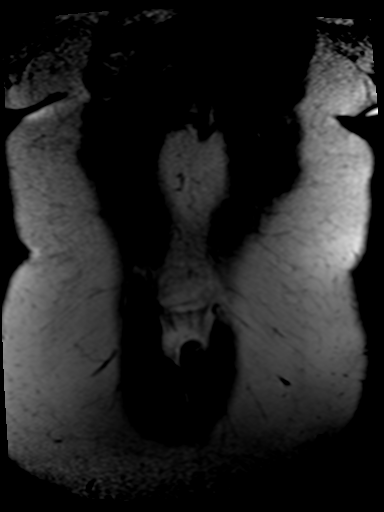

[Series 12: cor thins · coronal · 4.0mm · 0.89mm/px · 1 of 16 slices shown]
[im 1/16]
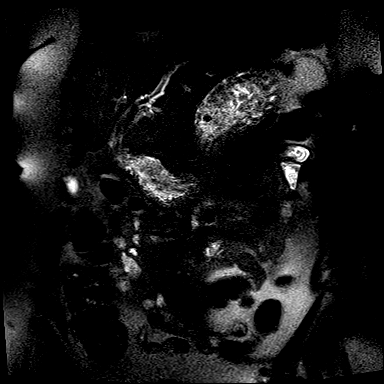

[Series 13: ax dual echo · axial · 7.0mm · 0.74mm/px · z∈[+25,+222]mm · 2 of 50 slices shown]
[im 1/50]
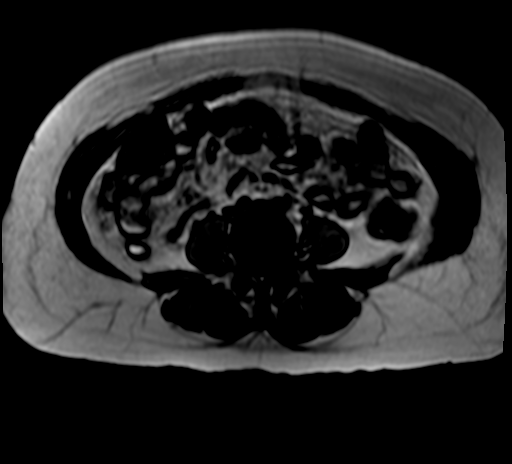
[im 50/50]
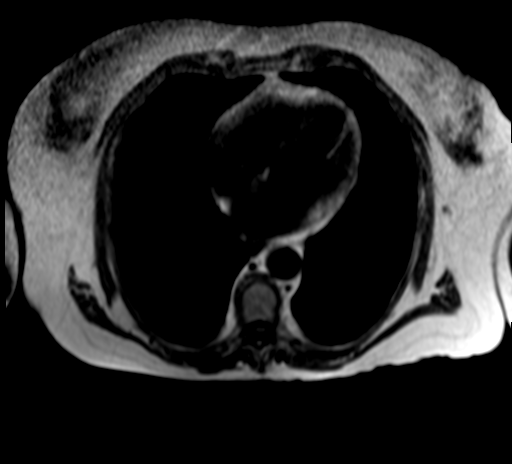

[Series 14: MRCP · oblique · 40.0mm · 0.91mm/px · 1 of 6 slices shown]
[im 1/6]
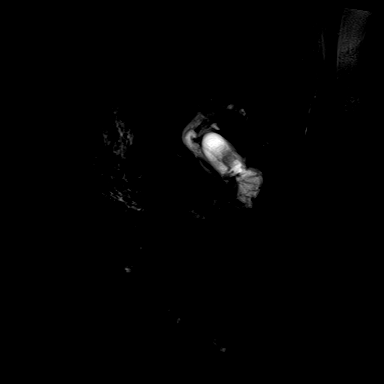

[Series 15: T1 dynamic fat-sat · axial · non-contrast · 2.5mm · 0.74mm/px · z∈[+12,+224]mm · 3 of 88 slices shown (1 of 4)]
[im 1/88]
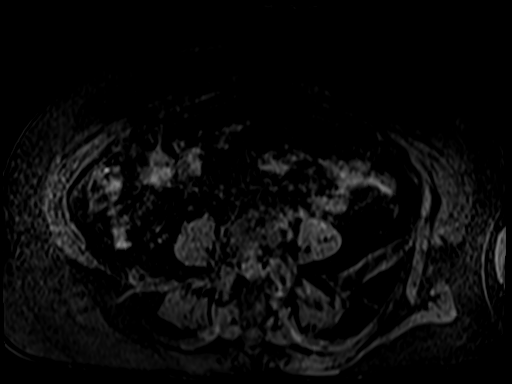
[im 44/88]
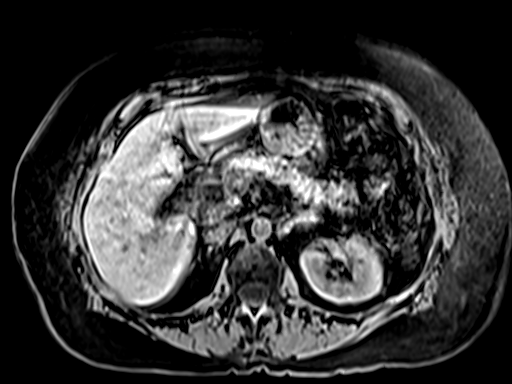
[im 88/88]
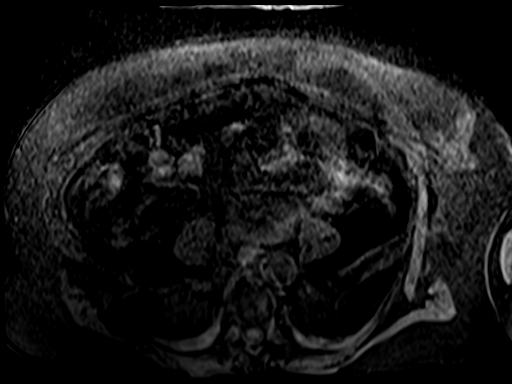

[Series 16: T1 dynamic fat-sat · axial · 2.5mm · 0.74mm/px · z∈[+12,+224]mm · 3 of 88 slices shown (2 of 4)]
[im 1/88]
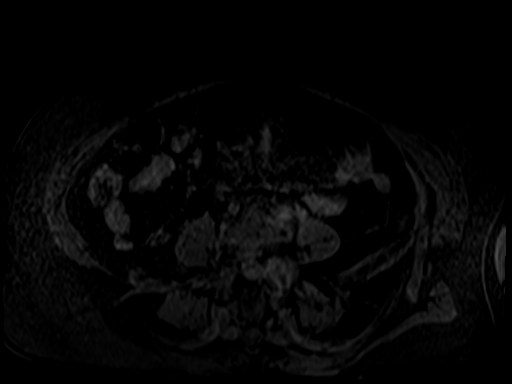
[im 44/88]
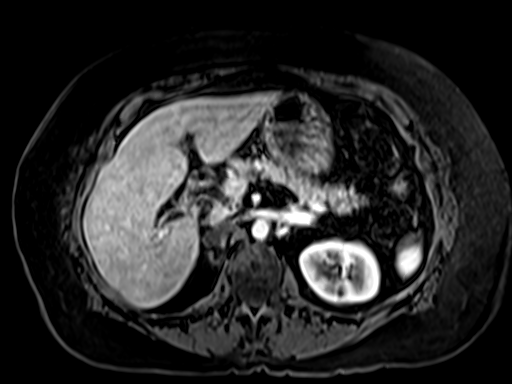
[im 88/88]
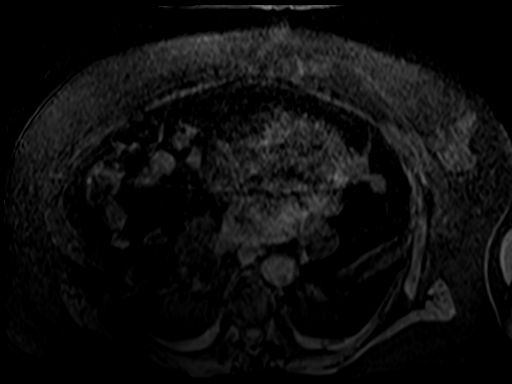

[Series 17: T1 dynamic fat-sat · axial · 2.5mm · 0.74mm/px · z∈[+12,+224]mm · 3 of 88 slices shown (3 of 4)]
[im 1/88]
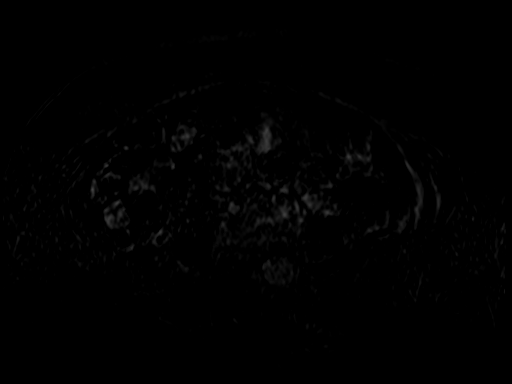
[im 44/88]
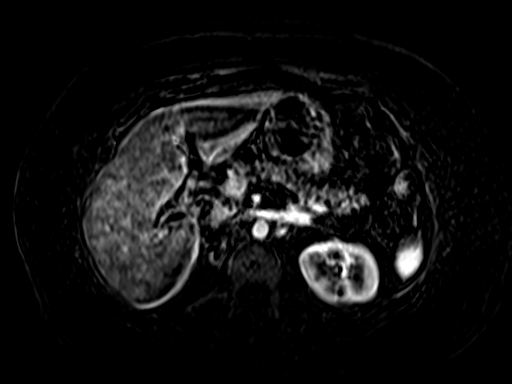
[im 88/88]
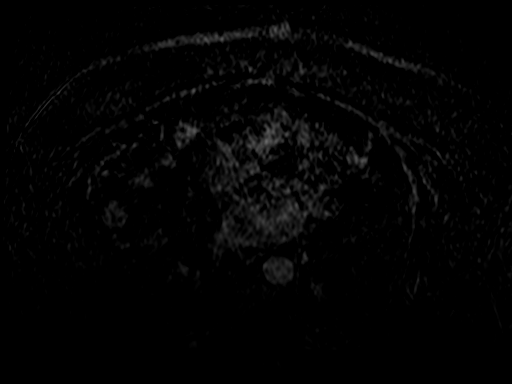

[Series 18: T1 dynamic fat-sat post-contrast · axial · 2.5mm · 0.74mm/px · z∈[+12,+224]mm · 3 of 88 slices shown]
[im 1/88]
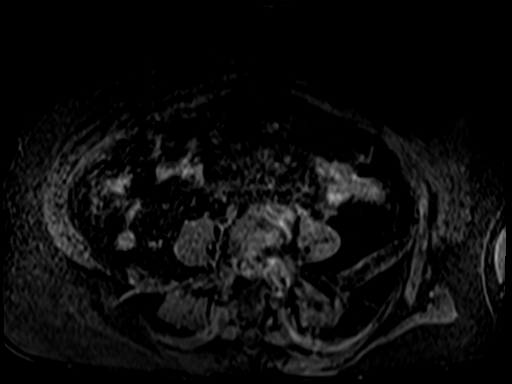
[im 44/88]
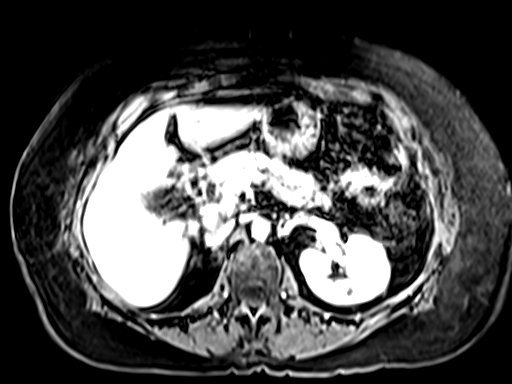
[im 88/88]
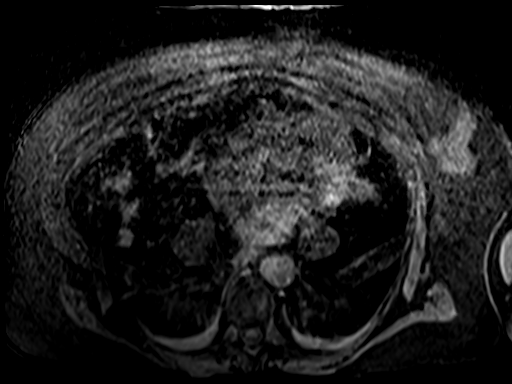

[Series 19: T1 dynamic fat-sat · axial · 2.5mm · 0.74mm/px · z∈[+12,+117]mm · 2 of 88 slices shown (4 of 4)]
[im 1/88]
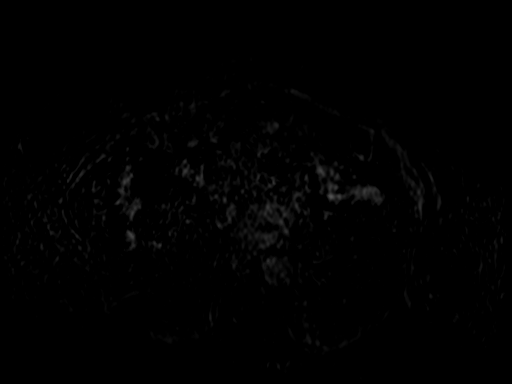
[im 44/88]
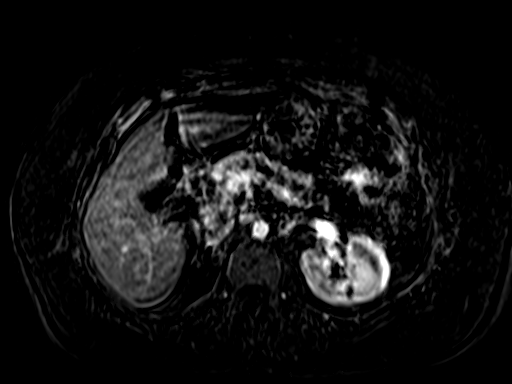

[29 of 48 positions shown; findings below may reference images not displayed]

FINDINGS: Lower chest: Small pleural effusions are identified bilaterally.

Hepatobiliary: Mild hepatic steatosis. No enhancing liver
abnormalities identified. There are small stones identified within
the gallbladder fundus measuring up to 5 mm. The common bile duct is
increased in caliber and there are 2 stones identified distally
measuring up to 6 mm. The common bile duct has a maximum diameter of
1 cm, image 25 of series 8.

Pancreas: No mass, inflammatory changes, or other parenchymal
abnormality identified.

Spleen:  Within normal limits in size and appearance.

Adrenals/Urinary Tract: Normal appearance of the adrenal glands.
Solitary left kidney. Status post right nephrectomy. No left kidney
mass or hydronephrosis.

Stomach/Bowel: The stomach is normal. The small bowel loops scratch
set the visualized upper abdominal bowel loops appear normal.

Vascular/Lymphatic: Aortic atherosclerosis. No aneurysm. No upper
abdominal adenopathy identified.

Other:  None.

Musculoskeletal: No suspicious bone lesions identified.
IMPRESSION: 1. Gallstones and choledocholithiasis. There are 2 stones within the
distal common bile duct which measure up to 6 mm.
2. Mild hepatic steatosis.
3. No complications of pancreatitis identified.

## 2017-05-10 IMAGING — CR DG CHOLANGIOGRAM OPERATIVE
2 series · 15 of 20 positions shown · non-contrast
Comparison: MRCP - 05/07/2016

CLINICAL DATA: Intraoperative cholangiogram during laparoscopic
cholecystectomy.

EXAM:
INTRAOPERATIVE CHOLANGIOGRAM
FLUOROSCOPY TIME:  32 seconds

[Series 4: cont. · 7 of 36 frames shown (1 of 2)]
[frame 1/36]
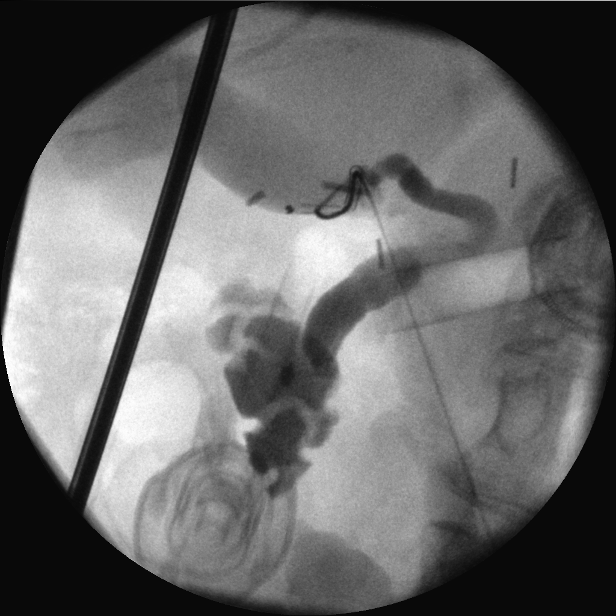
[frame 8/36]
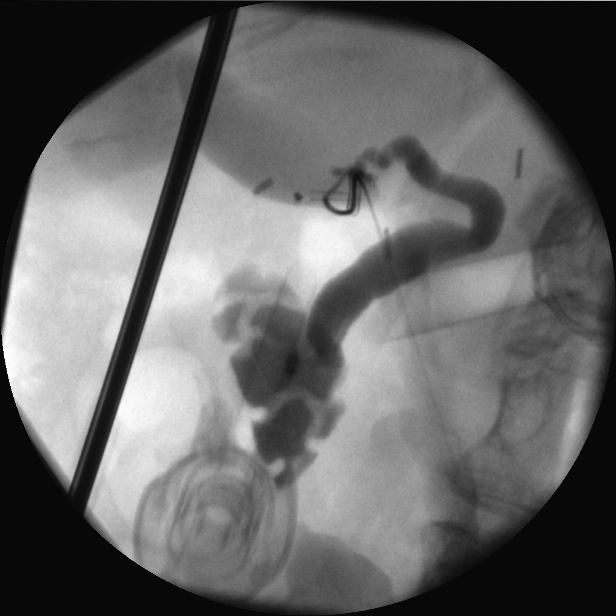
[frame 12/36]
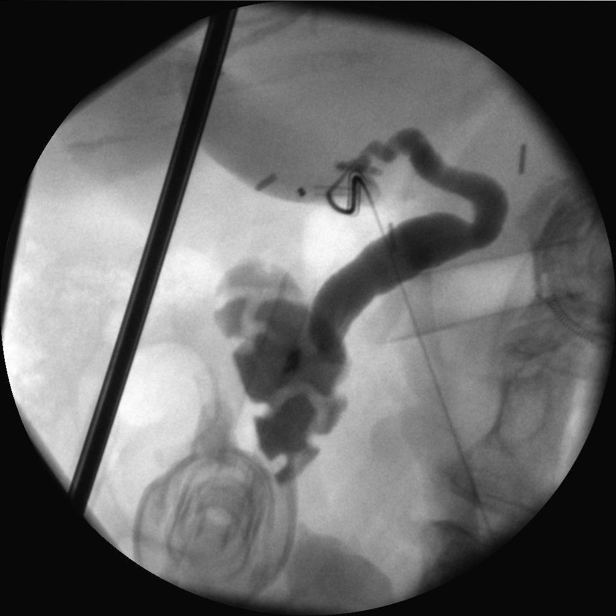
[frame 16/36]
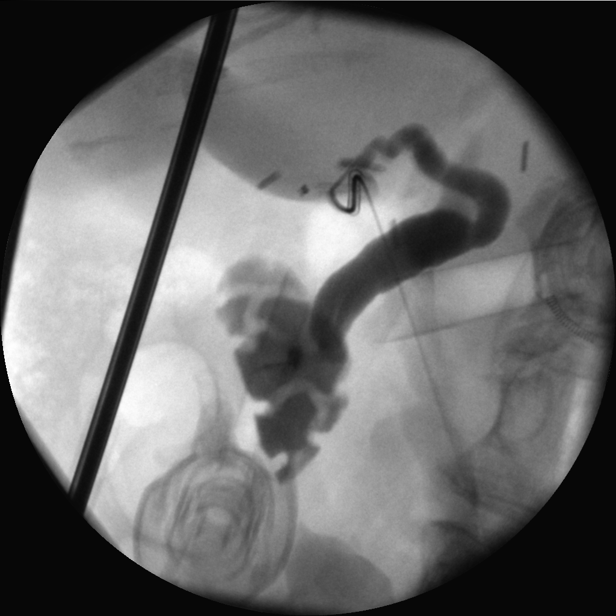
[frame 24/36]
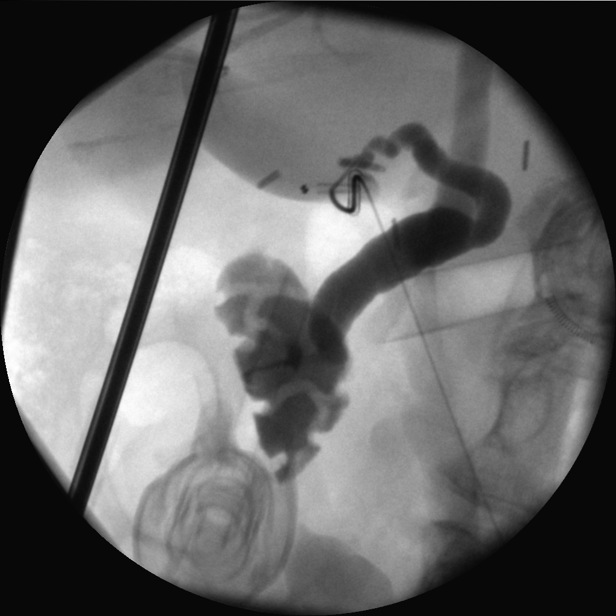
[frame 28/36]
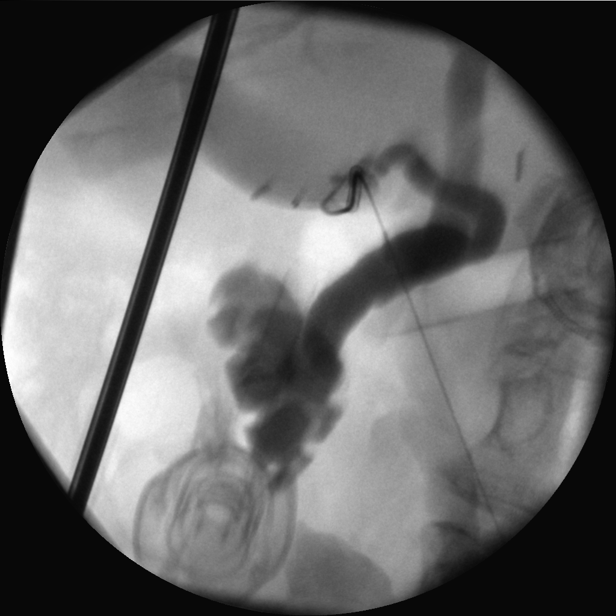
[frame 32/36]
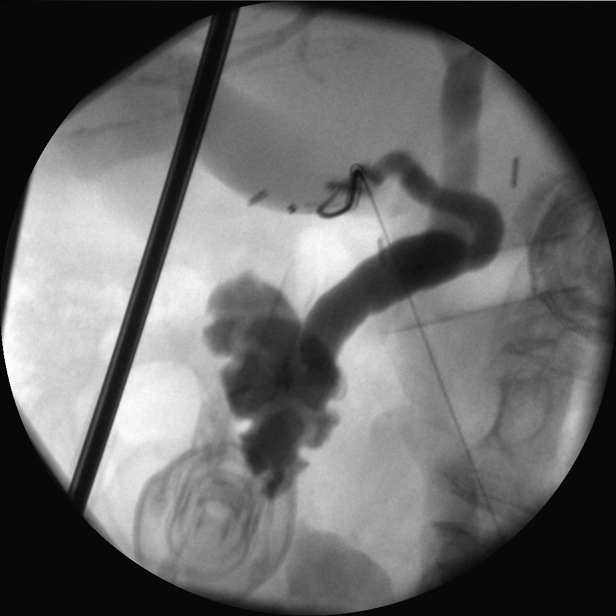

[Series 5: cont. · 8 of 18 frames shown (2 of 2)]
[frame 1/18]
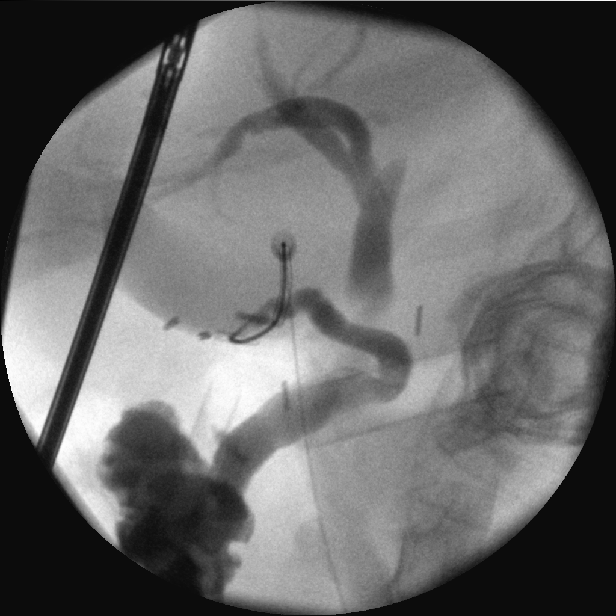
[frame 2/18]
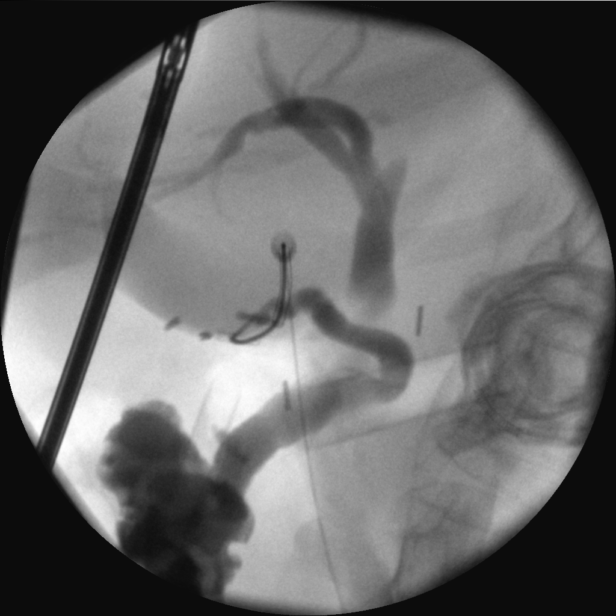
[frame 4/18]
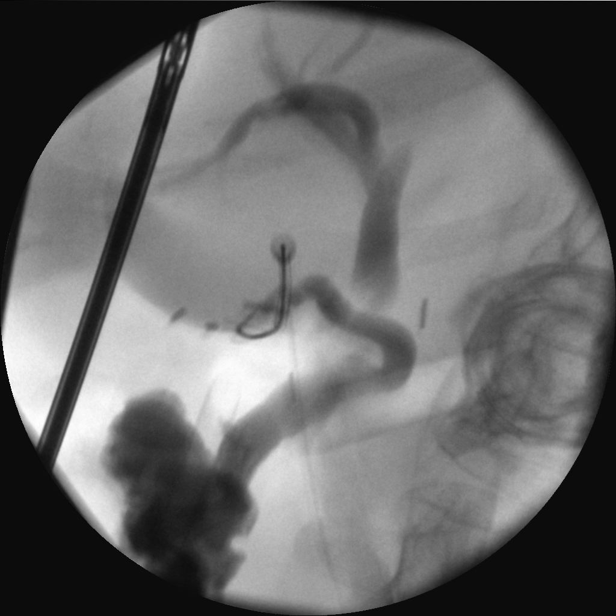
[frame 8/18]
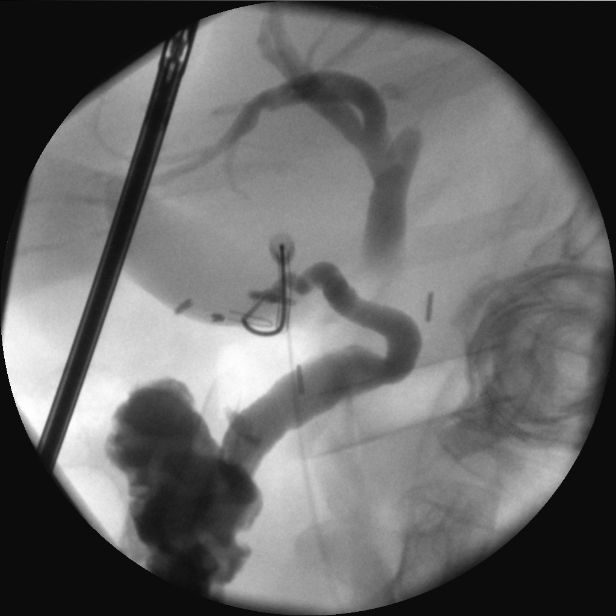
[frame 10/18]
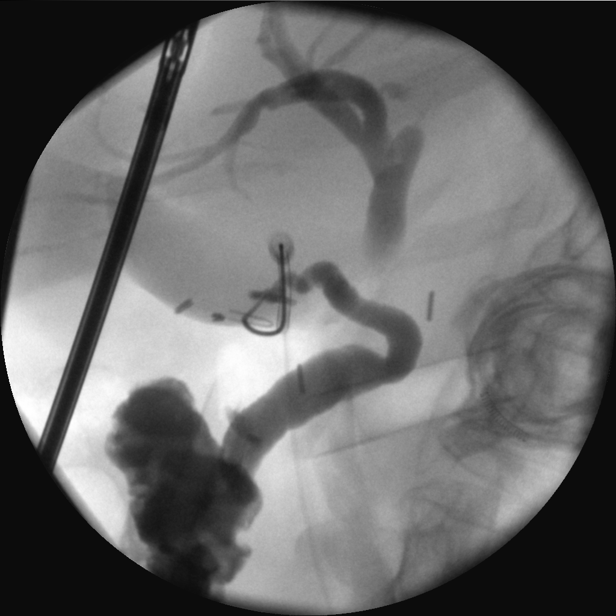
[frame 12/18]
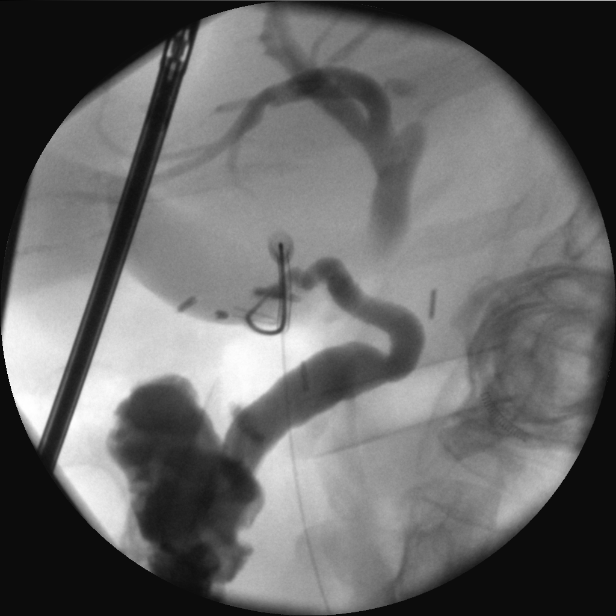
[frame 16/18]
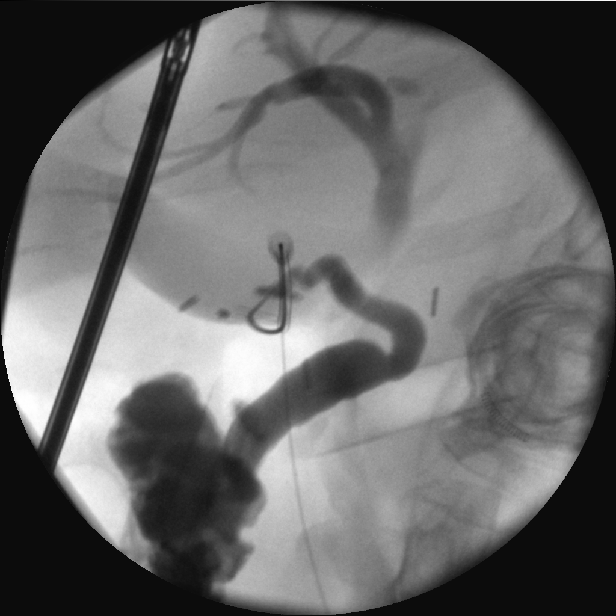
[frame 18/18]
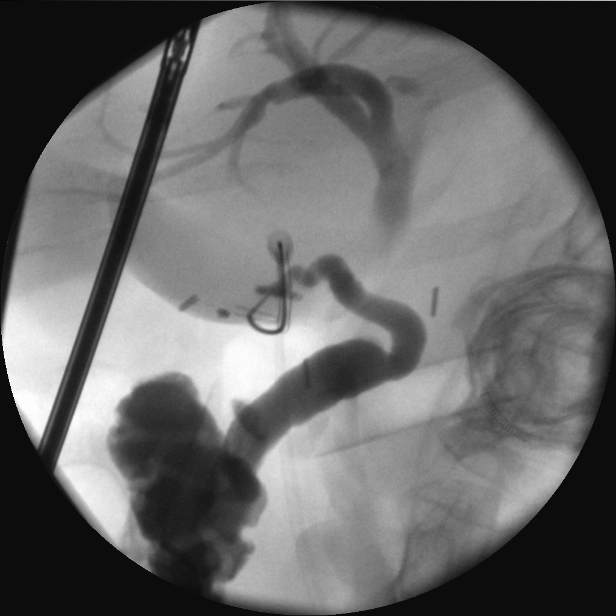

[15 of 20 positions shown; findings below may reference images not displayed]

FINDINGS: Intraoperative cholangiographic images of the right upper abdominal
quadrant during laparoscopic cholecystectomy are provided for
review.

Surgical clips overlie the expected location of the gallbladder
fossa.

Contrast injection demonstrates selective cannulation of the central
aspect of the cystic duct.

There is passage of contrast through the central aspect of the
cystic duct with filling of a mildly dilated common bile duct. There
is passage of contrast though the CBD and into the descending
portion of the duodenum.

There is minimal reflux of injected contrast into the common hepatic
duct and central aspect of the non dilated intrahepatic biliary
system.

There are no discrete filling defects within the opacified portions
of the biliary system to suggest the presence of
choledocholithiasis.
IMPRESSION: No evidence of choledocholithiasis.

## 2017-05-11 IMAGING — DX DG CHEST 1V PORT
1 series · 1 of 1 positions shown · non-contrast
Comparison: 11/25/2015 CXR.

CLINICAL DATA: Dyspnea

EXAM:
PORTABLE CHEST 1 VIEW

[chest ap]
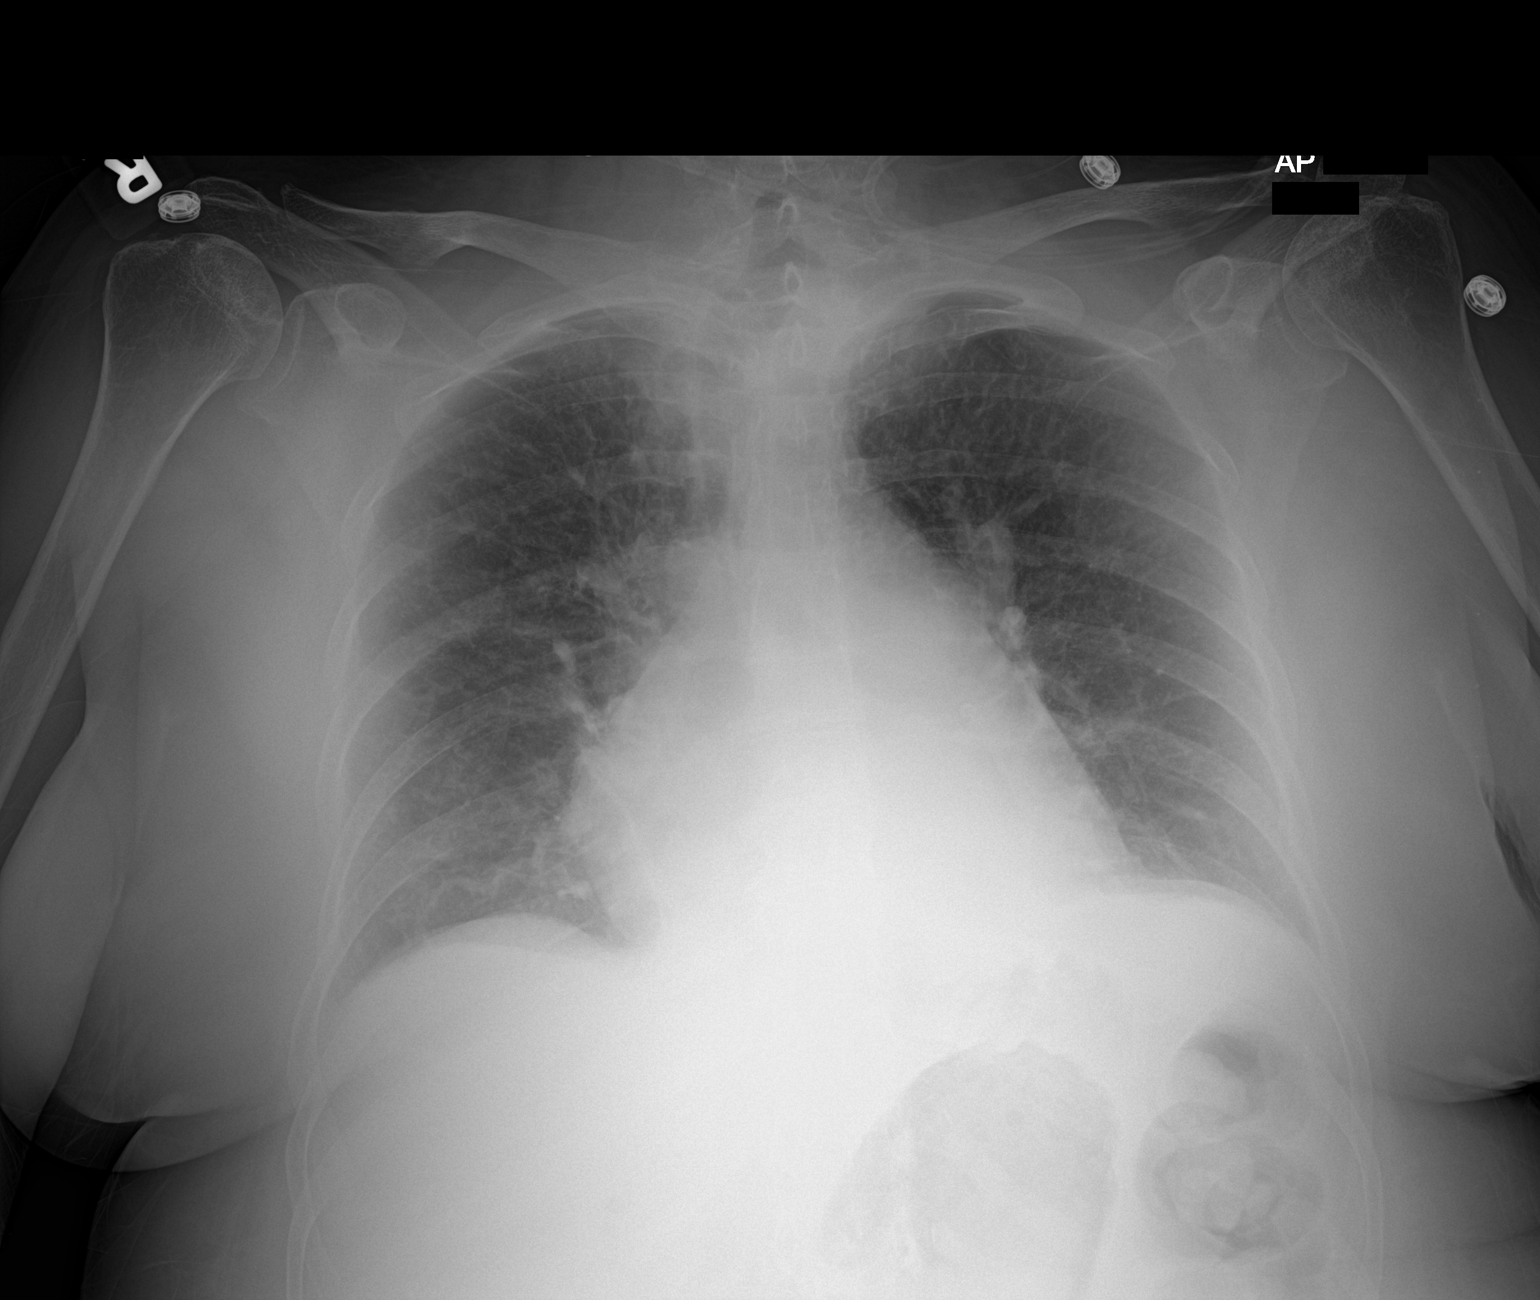

[1 of 1 positions shown; findings below may reference images not displayed]

FINDINGS: Slightly lower lung volumes than on prior with crowding of
interstitial lung markings. Borderline cardiomegaly. No aortic
aneurysm. No acute nor suspicious osseous abnormalities. Mild
interstitial edema.
IMPRESSION: Borderline cardiomegaly with mild interstitial edema.

## 2017-05-14 ENCOUNTER — Encounter: Payer: Self-pay | Admitting: Internal Medicine

## 2017-05-14 ENCOUNTER — Ambulatory Visit: Payer: Medicare Other | Admitting: Internal Medicine

## 2017-05-14 VITALS — BP 104/50 | HR 73 | Temp 98.7°F | Ht 62.5 in | Wt 168.0 lb

## 2017-05-14 DIAGNOSIS — J4521 Mild intermittent asthma with (acute) exacerbation: Secondary | ICD-10-CM

## 2017-05-14 MED ORDER — METHYLPREDNISOLONE ACETATE 40 MG/ML IJ SUSP
40.0000 mg | Freq: Once | INTRAMUSCULAR | Status: AC
  Administered 2017-05-14: 40 mg via INTRAMUSCULAR

## 2017-05-14 MED ORDER — HYDROCODONE-HOMATROPINE 5-1.5 MG/5ML PO SYRP: 5.0000 mL | ORAL_SOLUTION | Freq: Every evening | ORAL | 0 refills | Status: DC | PRN

## 2017-05-14 MED ORDER — FLUTICASONE FUROATE-VILANTEROL 100-25 MCG/INH IN AEPB
1.0000 | INHALATION_SPRAY | Freq: Every day | RESPIRATORY_TRACT | 3 refills | Status: DC
Start: 2017-05-14 — End: 2018-03-25

## 2017-05-14 MED ORDER — AMOXICILLIN-POT CLAVULANATE 875-125 MG PO TABS
1.0000 | ORAL_TABLET | Freq: Two times a day (BID) | ORAL | 0 refills | Status: DC
Start: 2017-05-14 — End: 2017-07-19

## 2017-05-14 MED ORDER — PREDNISONE 20 MG PO TABS
40.0000 mg | ORAL_TABLET | Freq: Every day | ORAL | 0 refills | Status: DC
Start: 2017-05-14 — End: 2017-07-19

## 2017-05-14 NOTE — Patient Instructions (Signed)
Please call me next week if not better Please use Breo daily rinse your mouth, albuterol as needed  We will try steroids , an antibiotic and hycodan cough use only use at night    Asthma Attack Prevention, Adult Although you may not be able to control the fact that you have asthma, you can take actions to prevent episodes of asthma (asthma attacks). These actions include:  Creating a written plan for managing and treating your asthma attacks (asthma action plan).  Monitoring your asthma.  Avoiding things that can irritate your airways or make your asthma symptoms worse (asthma triggers).  Taking your medicines as directed.  Acting quickly if you have signs or symptoms of an asthma attack.  What are some ways to prevent an asthma attack? Create a plan Work with your health care provider to create an asthma action plan. This plan should include:  A list of your asthma triggers and how to avoid them.  A list of symptoms that you experience during an asthma attack.  Information about when to take medicine and how much medicine to take.  Information to help you understand your peak flow measurements.  Contact information for your health care providers.  Daily actions that you can take to control asthma.  Monitor your asthma  To monitor your asthma:  Use your peak flow meter every morning and every evening for 2-3 weeks. Record the results in a journal. A drop in your peak flow numbers on one or more days may mean that you are starting to have an asthma attack, even if you are not having symptoms.  When you have asthma symptoms, write them down in a journal.  Avoid asthma triggers  Work with your health care provider to find out what your asthma triggers are. This can be done by:  Being tested for allergies.  Keeping a journal that notes when asthma attacks occur and what may have contributed to them.  Asking your health care provider whether other medical conditions make  your asthma worse.  Common asthma triggers include:  Dust.  Smoke. This includes campfire smoke and secondhand smoke from tobacco products.  Pet dander.  Trees, grasses or pollens.  Very cold, dry, or humid air.  Mold.  Foods that contain high amounts of sulfites.  Strong smells.  Engine exhaust and air pollution.  Aerosol sprays and fumes from household cleaners.  Household pests and their droppings, including dust mites and cockroaches.  Certain medicines, including NSAIDs.  Once you have determined your asthma triggers, take steps to avoid them. Depending on your triggers, you may be able to reduce the chance of an asthma attack by:  Keeping your home clean. Have someone dust and vacuum your home for you 1 or 2 times a week. If possible, have them use a high-efficiency particulate arrestance (HEPA) vacuum.  Washing your sheets weekly in hot water.  Using allergy-proof mattress covers and casings on your bed.  Keeping pets out of your home.  Taking care of mold and water problems in your home.  Avoiding areas where people smoke.  Avoiding using strong perfumes or odor sprays.  Avoid spending a lot of time outdoors when pollen counts are high and on very windy days.  Talking with your health care provider before stopping or starting any new medicines.  Medicines Take over-the-counter and prescription medicines only as told by your health care provider. Many asthma attacks can be prevented by carefully following your medicine schedule. Taking your medicines correctly  is especially important when you cannot avoid certain asthma triggers. Even if you are doing well, do not stop taking your medicine and do not take less medicine. Act quickly If an asthma attack happens, acting quickly can decrease how severe it is and how long it lasts. Take these actions:  Pay attention to your symptoms. If you are coughing, wheezing, or having difficulty breathing, do not wait to  see if your symptoms go away on their own. Follow your asthma action plan.  If you have followed your asthma action plan and your symptoms are not improving, call your health care provider or seek immediate medical care at the nearest hospital.  It is important to write down how often you need to use your fast-acting rescue inhaler. You can track how often you use an inhaler in your journal. If you are using your rescue inhaler more often, it may mean that your asthma is not under control. Adjusting your asthma treatment plan may help you to prevent future asthma attacks and help you to gain better control of your condition. How can I prevent an asthma attack when I exercise?  Exercise is a common asthma trigger. To prevent asthma attacks during exercise:  Follow advice from your health care provider about whether you should use your fast-acting inhaler before exercising. Many people with asthma experience exercise-induced bronchoconstriction (EIB). This condition often worsens during vigorous exercise in cold, humid, or dry environments. Usually, people with EIB can stay very active by using a fast-acting inhaler before exercising.  Avoid exercising outdoors in very cold or humid weather.  Avoid exercising outdoors when pollen counts are high.  Warm up and cool down when exercising.  Stop exercising right away if asthma symptoms start.  Consider taking part in exercises that are less likely to cause asthma symptoms such as:  Indoor swimming.  Biking.  Walking.  Hiking.  Playing football.  This information is not intended to replace advice given to you by your health care provider. Make sure you discuss any questions you have with your health care provider. Document Released: 01/07/2009 Document Revised: 09/20/2015 Document Reviewed: 07/06/2015 Elsevier Interactive Patient Education  [employer].

## 2017-05-14 NOTE — Progress Notes (Signed)
Chief Complaint  Patient presents with  . Cough  . URI   Sick visit  C/o hoarseness since yesterday am cough worse at night and dry to sometimes yellow phlegm. She has been having sweats in sleep and during the day no fever today. She has h/o asthma but stopped Breo only on neb albuterol and prn. She also takes Singulair but no OTC antihistamine    Review of Systems  Constitutional: Negative for weight loss.       +sweating   HENT: Negative for hearing loss.   Respiratory: Positive for cough. Negative for shortness of breath.   Cardiovascular: Negative for chest pain.  Skin: Negative for rash.  Endo/Heme/Allergies: Positive for environmental allergies.   Past Medical History:  Diagnosis Date  . Anxiety   . Asthma   . Cataract    s/p b/l repair   . Chicken pox   . CKD (chronic kidney disease)   . CKD (chronic kidney disease), stage III ([place])    a. s/p R nephrectomy.  . Conversion disorder   . COPD (chronic obstructive pulmonary disease) ([place])   . Depression   . Essential hypertension   . GERD (gastroesophageal reflux disease)   . Hyperlipidemia   . Inflammatory arthritis    a. hands/carpal tunnel.  b. Low titer rheumatoid factor. c. Negative anti-CCP antibodies. d. Plaquenil.  . Non-Obstructive CAD    a. 07/2009 Cath ([facility]): nonobs dzs;  b. 03/2011 [facility]): nonobs dzs.  . Osteoarthritis    a. Knees.  . PUD (peptic ulcer disease)   . S/P right hip fracture    11/01/16 s/p repair  . Spinal stenosis at L4-L5 level    severe with L4/L5 anterolisthesis grade 1 anterolisthesis   . Toxic maculopathy   . Valvular heart disease    a. 07/2015 Echo: EF 55-60%, Mild AI, AS, MR, and TR.   Past Surgical History:  Procedure Laterality Date  . APPENDECTOMY    . BACK SURGERY    . BUNIONECTOMY    . CATARACT EXTRACTION, BILATERAL    . CESAREAN SECTION     x1  . CHOLECYSTECTOMY N/A 05/11/2016   Procedure: LAPAROSCOPIC CHOLECYSTECTOMY;  Surgeon: Florene Glen, MD;  Location: [facility]  ORS;  Service: General;  Laterality: N/A;  . COLONOSCOPY WITH PROPOFOL N/A 04/02/2016   Procedure: COLONOSCOPY WITH PROPOFOL;  Surgeon: Jonathon Bellows, MD;  Location: [facility] ENDOSCOPY;  Service: Endoscopy;  Laterality: N/A;  . ENDOSCOPIC RETROGRADE CHOLANGIOPANCREATOGRAPHY (ERCP) WITH PROPOFOL N/A 05/08/2016   Procedure: ENDOSCOPIC RETROGRADE CHOLANGIOPANCREATOGRAPHY (ERCP) WITH PROPOFOL;  Surgeon: Lucilla Lame, MD;  Location: [facility] ENDOSCOPY;  Service: Endoscopy;  Laterality: N/A;  . ERCP     with biliary spincterotomy 05/08/16 Dr. Allen Norris for choledocholithiasis   . ESOPHAGEAL DILATION  04/02/2016   Procedure: ESOPHAGEAL DILATION;  Surgeon: Jonathon Bellows, MD;  Location: [facility] ENDOSCOPY;  Service: Endoscopy;;  . ESOPHAGOGASTRODUODENOSCOPY (EGD) WITH PROPOFOL N/A 04/02/2016   Procedure: ESOPHAGOGASTRODUODENOSCOPY (EGD) WITH PROPOFOL;  Surgeon: Jonathon Bellows, MD;  Location: [facility] ENDOSCOPY;  Service: Endoscopy;  Laterality: N/A;  . HIP ARTHROPLASTY Right 11/01/2016   Procedure: ARTHROPLASTY BIPOLAR HIP (HEMIARTHROPLASTY);  Surgeon: Corky Mull, MD;  Location: [facility] ORS;  Service: Orthopedics;  Laterality: Right;  . 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 osteoporosis     noted DEXA 08/19/16   . REPLACEMENT TOTAL KNEE Right   . TONSILLECTOMY    . TOTAL HIP ARTHROPLASTY  12/10/11   [facility] left  hip  . TOTAL HIP ARTHROPLASTY Bilateral   . TUBAL LIGATION     Family History  Problem Relation Age of Onset  . Rheum arthritis Mother   . Asthma Mother   . Parkinson's disease Mother   . Heart disease Mother   . Stroke Mother   . Hypertension Mother   . Heart attack Father   . Heart disease Father   . Hypertension Father   . Diabetes Son   . Gout Son   . Asthma Sister   . Heart disease Sister   . Lung cancer Sister   . Heart disease Sister   . Heart disease Sister   . Breast cancer Sister   . Heart attack Sister   . Heart disease Brother   . Heart disease Maternal  Grandmother   . Diabetes Maternal Grandmother   . Colon cancer Maternal Grandmother   . Cancer Maternal Grandmother        Hodgkins lymphoma  . Heart disease Brother   . Alcohol abuse Brother   . Depression Brother   . Dementia Son    Social History   Socioeconomic History  . Marital status: Married    Spouse name: richard  . Number of children: 2  . Years of education: Some Coll  . Highest education level: Some college, no degree  Occupational History  . Occupation: Retired    Comment: retired  Scientific laboratory technician  . Financial resource strain: Very hard  . Food insecurity:    Worry: Often true    Inability: Often true  . Transportation needs:    Medical: No    Non-medical: No  Tobacco Use  . Smoking status: Former Smoker    Packs/day: 0.50    Years: 20.00    Pack years: 10.00    Types: Cigarettes    Last attempt to quit: 02/02/1974    Years since quitting: 43.3  . Smokeless tobacco: Never Used  Substance and Sexual Activity  . Alcohol use: No  . Drug use: No  . Sexual activity: Not Currently  Lifestyle  . Physical activity:    Days per week: 0 days    Minutes per session: 0 min  . Stress: Not on file  Relationships  . Social connections:    Talks on phone: More than three times a week    Gets together: Never    Attends religious service: More than 4 times per year    Active member of club or organization: No    Attends meetings of clubs or organizations: Never    Relationship status: Married  . Intimate partner violence:    Fear of current or ex partner: No    Emotionally abused: No    Physically abused: No    Forced sexual activity: No  Other Topics Concern  . Not on file  Social History Narrative   Lives at home in [place] with her husband and grandson.   Right-handed.   6 cups coffee per day.   No outpatient medications have been marked as taking for the 05/14/17 encounter (Office Visit) with McLean-Scocuzza, Nino Glow, MD.   Allergies  Allergen  Reactions  . Ceftin [Cefuroxime Axetil] Anaphylaxis    Other reaction(s): Other (See Comments) REACTION: tongue and throat swell Other Reaction: TONGUE AND THROAT SWELLING REACTION: tongue and throat swell  . Lisinopril Anaphylaxis    REACTION: tongue and throat swelling (onset 10-10-09)  . Morphine Other (See Comments)    Per patient, low blood pressure issues, following  morphine, that requires action to raise it back up. Drop in BP - can take small infrequent doses  . Sulfasalazine Anaphylaxis    Swelling tongue, lips, and jaws  . Aspirin Other (See Comments)    Sulfasalazine allergy cross reacts  . Antihistamines, Chlorpheniramine-Type     REACTION: makes pt hyper  . Antivert [Meclizine Hcl]     Other reaction(s): Unknown REACTION: bladder will not empty REACTION: bladder will not empty  . Decongestant [Pseudoephedrine Hcl]     Other reaction(s): Unknown REACTION: makes pt hyper REACTION: makes pt hyper  . Doxycycline     REACTION: GI upset  . Morphine And Related Other (See Comments)    Per patient, low blood pressure issues, following morphine, that requires action to raise it back up.  . Polymyxin B     Medication was in eye drops.  . Pseudoephedrine Hcl Other (See Comments)    REACTION: makes pt hyper  . Sulfa Antibiotics     Face swelling  . Xarelto [Rivaroxaban]     Other reaction(s): Other (See Comments) GI bleed Stomach burning, bleeding, and tar in stool GI bleed  . Adhesive [Tape] Rash  . Iodine Hives and Rash  . Levaquin [Levofloxacin In D5w] Rash  . Tetanus Toxoid Adsorbed Other (See Comments)    REACTION: rash, fever, hot to touch at injection site  . Tetanus Toxoids     REACTION: rash, fever, hot to touch at injection site   Recent Results (from the past 2160 hour(s))  Comprehensive metabolic panel     Status: Abnormal   Collection Time: 02/22/17  4:56 PM  Result Value Ref Range   Sodium 137 135 - 145 mEq/L   Potassium 4.9 3.5 - 5.1 mEq/L    Chloride 100 96 - 112 mEq/L   CO2 29 19 - 32 mEq/L   Glucose, Bld 79 70 - 99 mg/dL   BUN 21 6 - 23 mg/dL   Creatinine, Ser 1.23 (H) 0.40 - 1.20 mg/dL   Total Bilirubin 0.3 0.2 - 1.2 mg/dL   Alkaline Phosphatase 127 (H) 39 - 117 U/L   AST 19 0 - 37 U/L   ALT 12 0 - 35 U/L   Total Protein 7.5 6.0 - 8.3 g/dL   Albumin 4.1 3.5 - 5.2 g/dL   Calcium 9.7 8.4 - 10.5 mg/dL   GFR 45.31 (L) >60.00 mL/min  CBC with Differential/Platelet     Status: Abnormal   Collection Time: 02/22/17  4:56 PM  Result Value Ref Range   WBC 10.5 4.0 - 10.5 K/uL   RBC 4.96 3.87 - 5.11 Mil/uL   Hemoglobin 13.4 12.0 - 15.0 g/dL   HCT 41.5 36.0 - 46.0 %   MCV 83.6 78.0 - 100.0 fl   MCHC 32.3 30.0 - 36.0 g/dL   RDW 18.3 (H) 11.5 - 15.5 %   Platelets 243.0 150.0 - 400.0 K/uL   Neutrophils Relative % 50.9 43.0 - 77.0 %   Lymphocytes Relative 33.3 12.0 - 46.0 %   Monocytes Relative 8.1 3.0 - 12.0 %   Eosinophils Relative 7.2 (H) 0.0 - 5.0 %   Basophils Relative 0.5 0.0 - 3.0 %   Neutro Abs 5.3 1.4 - 7.7 K/uL   Lymphs Abs 3.5 0.7 - 4.0 K/uL   Monocytes Absolute 0.9 0.1 - 1.0 K/uL   Eosinophils Absolute 0.8 (H) 0.0 - 0.7 K/uL   Basophils Absolute 0.1 0.0 - 0.1 K/uL   Objective  Body mass index is 30.24 kg/m.  Wt Readings from Last 3 Encounters:  05/14/17 168 lb (76.2 kg)  03/10/17 159 lb 3.2 oz (72.2 kg)  03/01/17 164 lb 12.8 oz (74.8 kg)   Temp Readings from Last 3 Encounters:  05/14/17 98.7 F (37.1 C) (Oral)  03/10/17 97.8 F (36.6 C) (Oral)  03/01/17 98.3 F (36.8 C) (Oral)   BP Readings from Last 3 Encounters:  05/14/17 (!) 104/50  03/10/17 (!) 130/58  03/01/17 118/70   Pulse Readings from Last 3 Encounters:  05/14/17 73  03/10/17 67  03/01/17 76    Physical Exam  Constitutional: She is oriented to person, place, and time. Vital signs are normal. She appears well-developed and well-nourished.  HENT:  Head: Normocephalic.  Mouth/Throat: Oropharynx is clear and moist and mucous membranes  are normal.  Eyes: Pupils are equal, round, and reactive to light. Conjunctivae are normal.  Cardiovascular: Normal rate, regular rhythm and normal heart sounds.  Pulmonary/Chest: Effort normal. She has wheezes.  Neurological: She is alert and oriented to person, place, and time. Gait normal.  BL walks with walker   Skin: Skin is warm, dry and intact.  Psychiatric: She has a normal mood and affect. Her speech is normal and behavior is normal. Judgment and thought content normal. Cognition and memory are normal.  Nursing note and vitals reviewed.   Assessment   1. Asthma exacerbation  Plan  1.  Add back breo, prn Albuterol and Albulterol neb  Steroid depomedrol 40 x 1 given today prednisone 40 mg x 7 days  Hycodan qhs prn  Cont singulair  Call back if not better Monday  Pt is not no antihistamine h/o a lot of allergies so ? If we should try  Avoid triggers may want to wear mask outside   Provider: Dr. Olivia Mackie McLean-Scocuzza-Internal Medicine

## 2017-05-14 NOTE — Progress Notes (Signed)
Pre visit review using our clinic review tool, if applicable. No additional management support is needed unless otherwise documented below in the visit note. 

## 2017-05-17 ENCOUNTER — Telehealth: Payer: Self-pay | Admitting: Internal Medicine

## 2017-05-17 NOTE — Telephone Encounter (Signed)
FYI

## 2017-05-17 NOTE — Telephone Encounter (Signed)
Copied from [place] [id number]. Topic: Inquiry >> May 17, 2017  9:06 AM Corie Chiquito, [place] wrote: Reason for CRM: Patient calling to let Dr.McLean know that she is feeling and doing good. Stated that she had a goods night sleep and now she is going to visit with her husband. If she needs to speak with her about anything she can be reached on her call [phone]

## 2017-05-24 ENCOUNTER — Other Ambulatory Visit: Payer: Self-pay | Admitting: Family Medicine

## 2017-06-11 IMAGING — CT CT RENAL STONE PROTOCOL
3 of 4 series · 7 of 46 positions shown, 13 images · non-contrast
Comparison: 02/25/2016

CLINICAL DATA: Left flank pain extending around the left breast to
the back.

EXAM:
CT ABDOMEN AND PELVIS WITHOUT CONTRAST
TECHNIQUE: Multidetector CT imaging of the abdomen and pelvis was performed
following the standard protocol without IV contrast.

[Series 4: lung bases · axial · 0.81mm/px · z∈[-811,-771]mm · 3 of 18 slices shown, 7 images]
[im 5/18  soft-tissue]
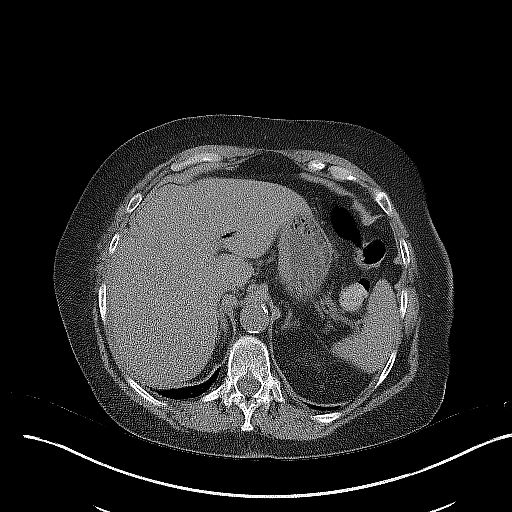
[im 5/18  lung]
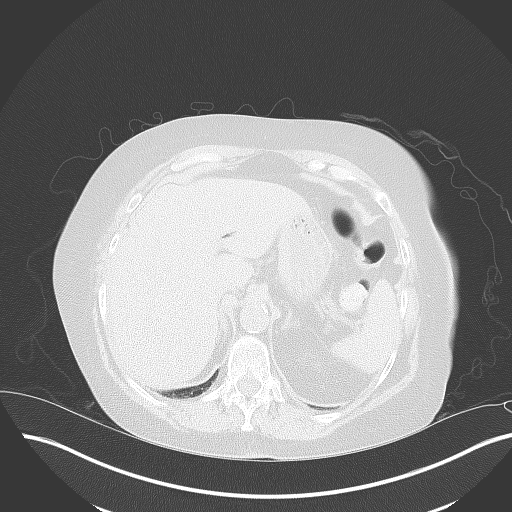
[im 5/18  bone]
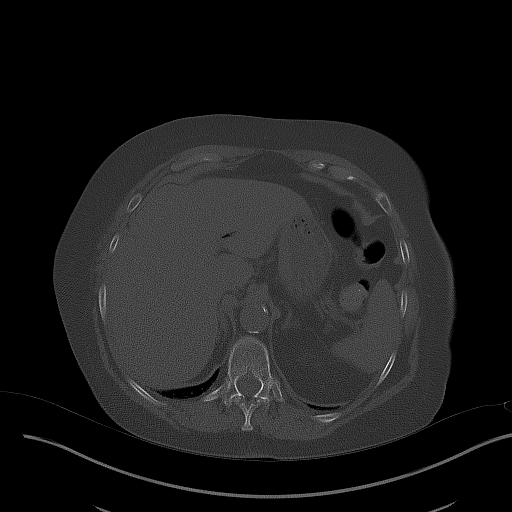
[im 9/18  soft-tissue]
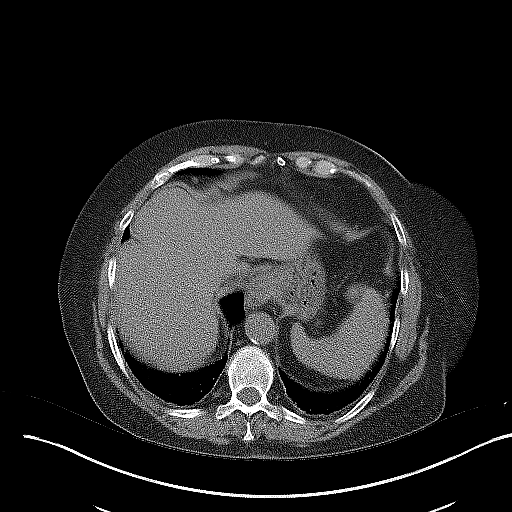
[im 9/18  lung]
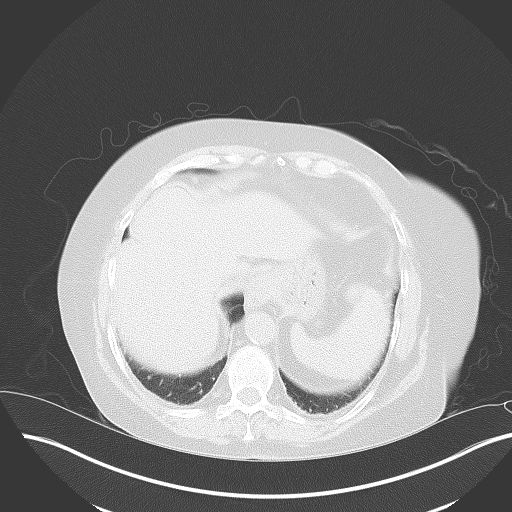
[im 13/18  soft-tissue]
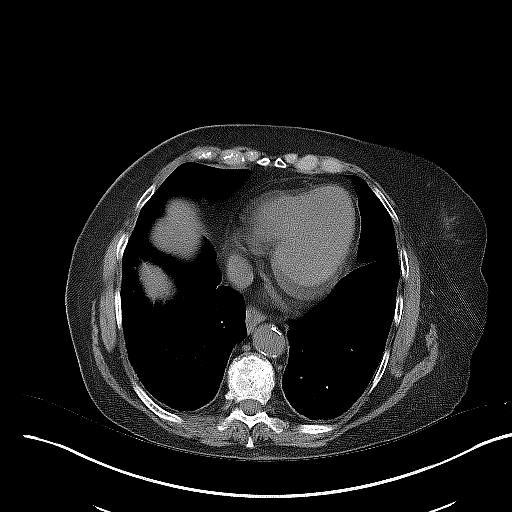
[im 13/18  lung]
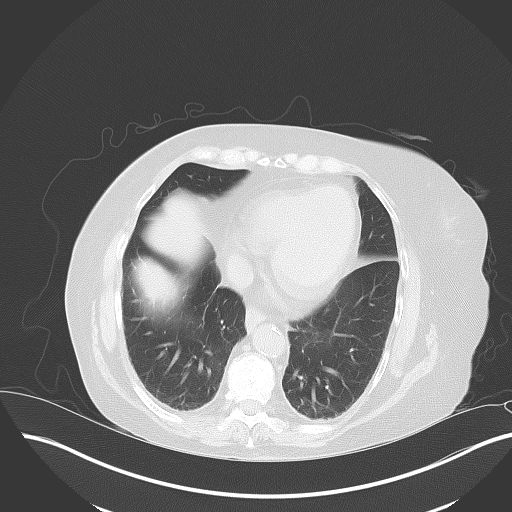

[Series 5: coronal · coronal · 0.73mm/px · 3 of 128 slices shown, 4 images]
[im 43/128  soft-tissue]
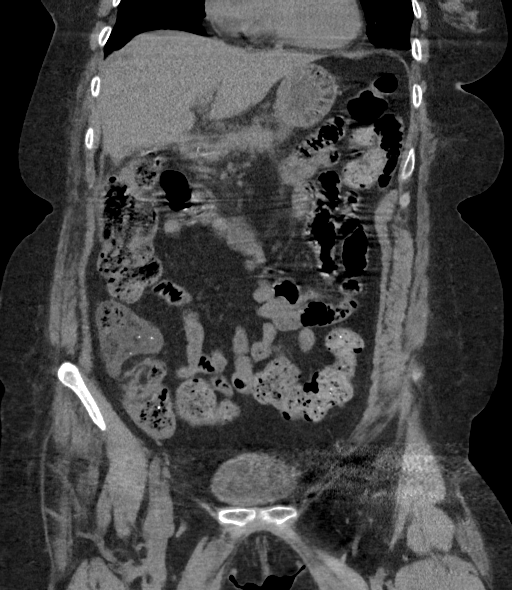
[im 57/128  soft-tissue]
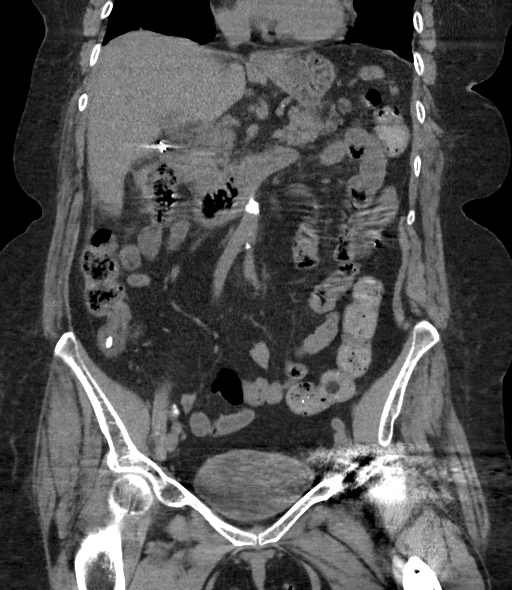
[im 57/128  bone]
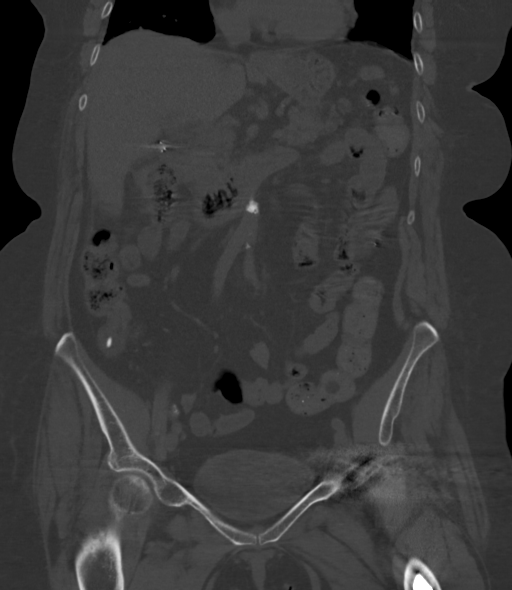
[im 71/128  soft-tissue]
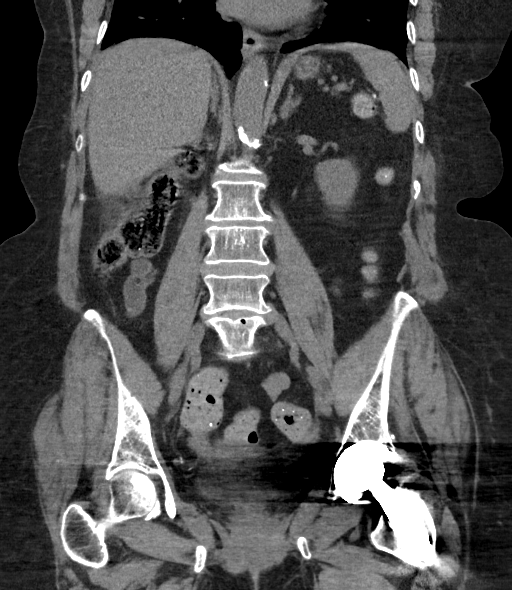

[Series 6: sagittal · sagittal · 0.57mm/px · 1 of 164 slices shown, 2 images]
[im 55/164  soft-tissue]
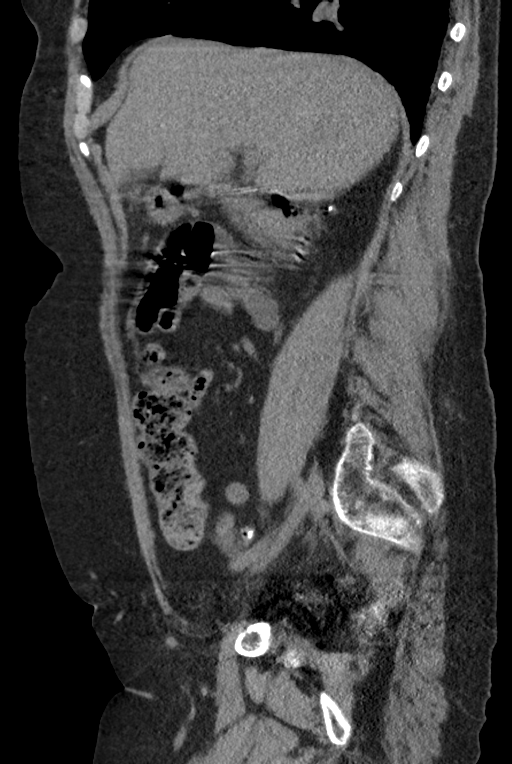
[im 55/164  bone]
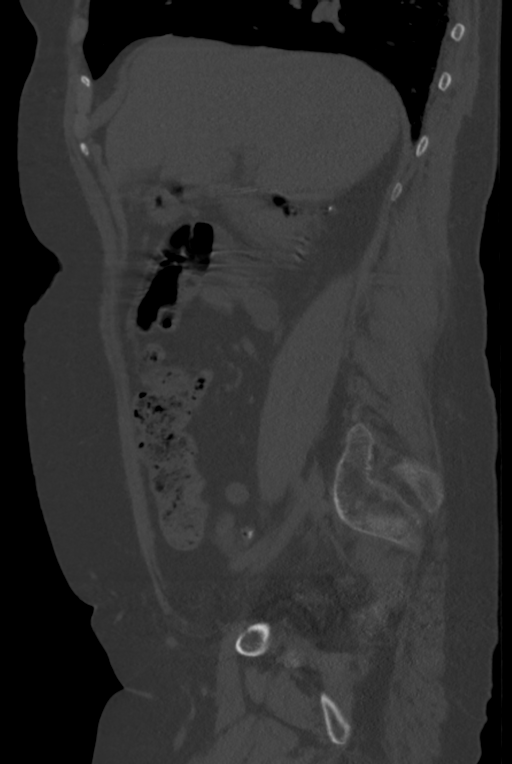

[7 of 46 positions shown; findings below may reference images not displayed]

FINDINGS: Lower chest:  No acute finding

Hepatobiliary: Pneumobilia. Patient had ERCP 05/08/2016.
Cholecystectomy. There is fat reticulation in the right upper
quadrant. Patient had cholecystectomy 05/11/2016. Inflammation is
somewhat greater than expected for 1 month later, but there is no
collection of extraluminal gas noted. Reportedly, patient's pain is
on the left.No visible choledocholithiasis.

Pancreas: Unremarkable.

Spleen: Unremarkable.

Adrenals/Urinary Tract: Negative adrenals. Right nephrectomy. The
distal left ureters obscured by streak artifact from hip prosthesis.
No hydronephrosis. 8 mm left renal hilar calcification with features
of peripherally calcified aneurysm, known. Negative urinary bladder.

Stomach/Bowel: No obstruction. No pericecal inflammation. Patient
has history of appendectomy. Formed stool throughout the colon.

Vascular/Lymphatic: Aortic atherosclerosis.  No mass or adenopathy.

Reproductive:No pathologic findings.

Other: No ascites or pneumoperitoneum.

Laparoscopic port sites noted.  No abdominal wall fluid collection.

Musculoskeletal: Total left hip arthroplasty. Lumbar spinal
degeneration, most notably advanced L4-5 facet arthropathy with
anterolisthesis.
IMPRESSION: 1. No acute finding to explain left flank pain. No hydronephrosis or
urolithiasis of the solitary left kidney.
2. Fat reticulation in the right upper quadrant. If asymptomatic
this is presumably residual postsurgical changes from
cholecystectomy 05/11/2016. No evidence of fluid collection.
3. Formed stool throughout the redundant colon.
4. Chronic findings are described above.

## 2017-06-11 IMAGING — CT CT ABD-PELV W/ CM
2 of 5 series · 16 of 46 positions shown, 18 images · IV contrast (APPLIED)
Comparison: Noncontrast CT 2 hours prior.

CLINICAL DATA: Severe left upper quadrant tenderness, nausea.

EXAM:
CT ABDOMEN AND PELVIS WITH CONTRAST
TECHNIQUE: Multidetector CT imaging of the abdomen and pelvis was performed
using the standard protocol following bolus administration of
intravenous contrast.
CONTRAST:  75mL ZGWXNS-O99 IOPAMIDOL (ZGWXNS-O99) INJECTION 61%.
Patient premedicated, no adverse reactions immediately after
contrast injection.

[Series 2: routine abd/pel with · axial · 0.92mm/px · z∈[-1261,-866]mm · 13 of 89 slices shown, 15 images]
[im 5/89  soft-tissue]
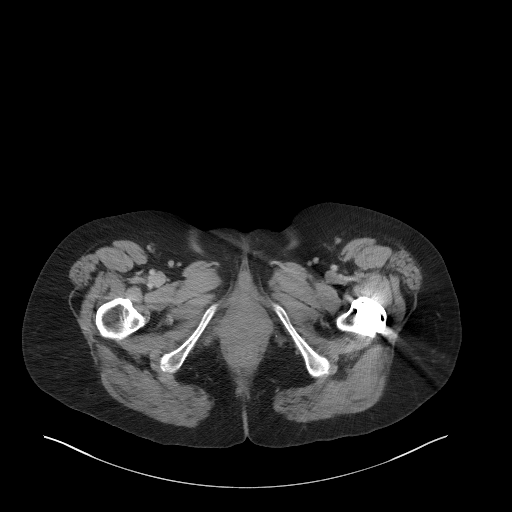
[im 5/89  bone]
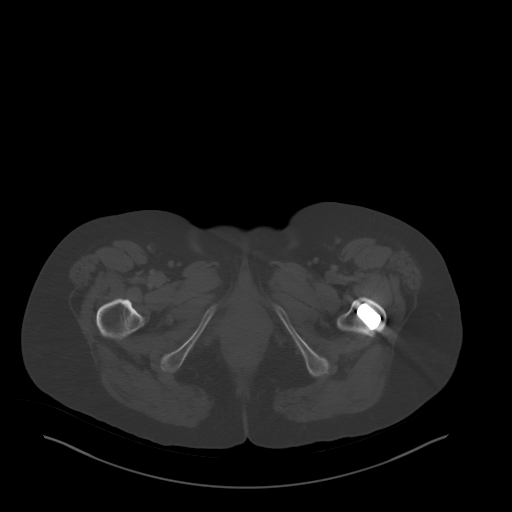
[im 14/89  soft-tissue]
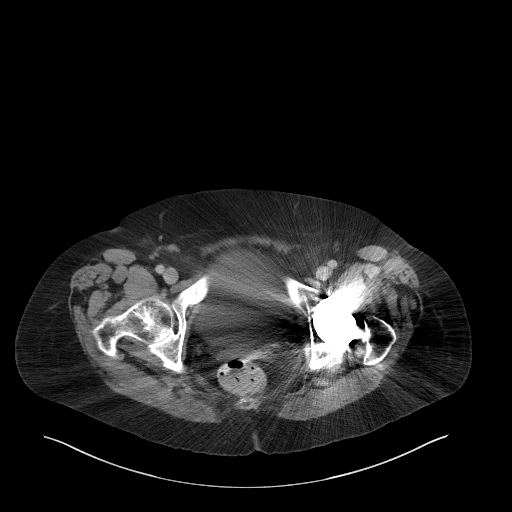
[im 18/89  soft-tissue]
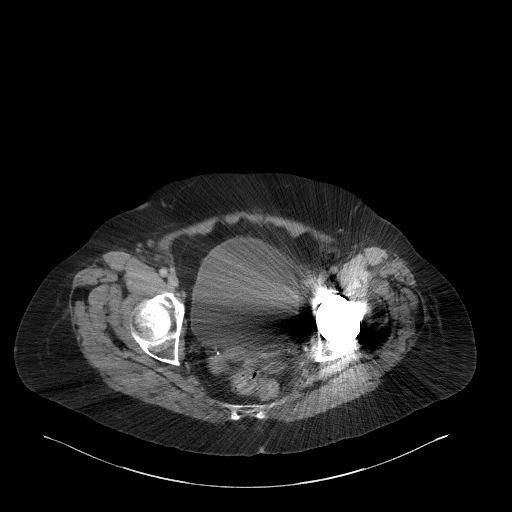
[im 27/89  soft-tissue]
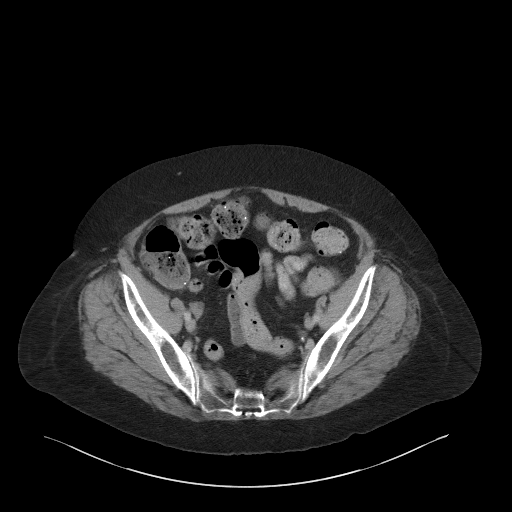
[im 31/89  soft-tissue]
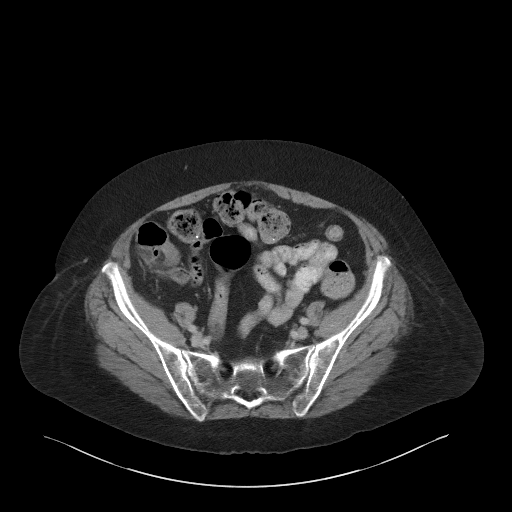
[im 40/89  soft-tissue]
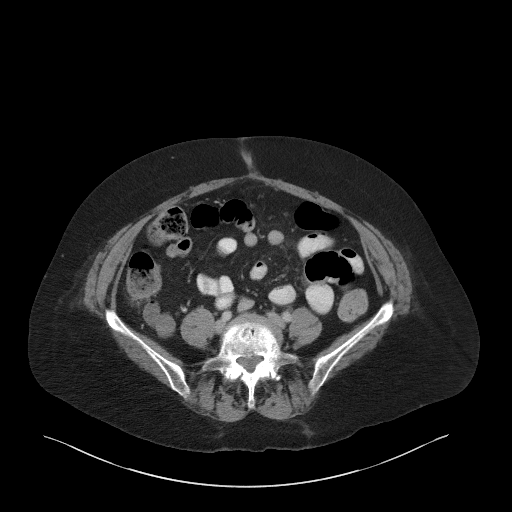
[im 45/89  soft-tissue]
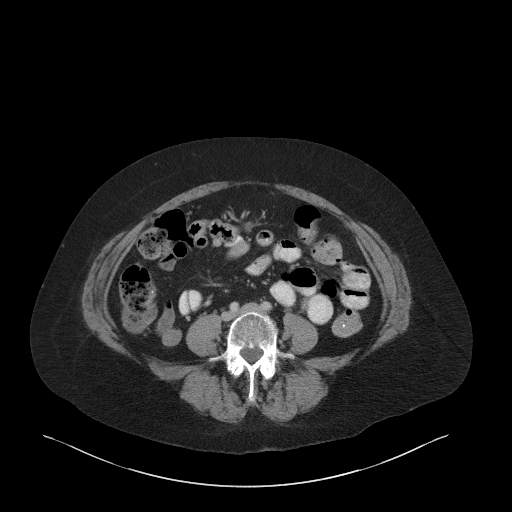
[im 49/89  soft-tissue]
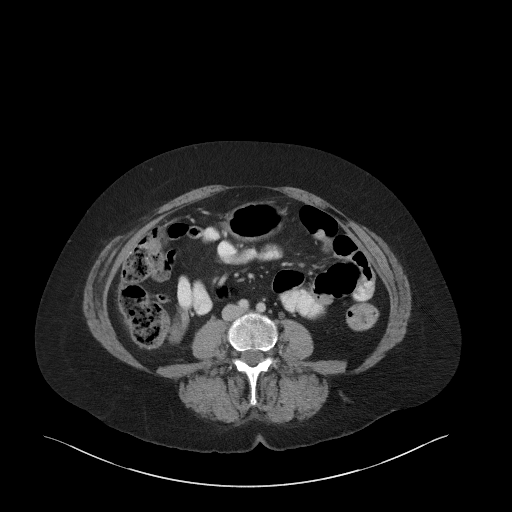
[im 58/89  soft-tissue]
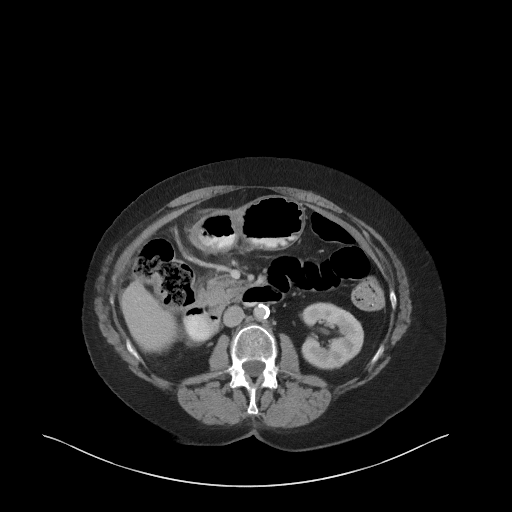
[im 58/89  bone]
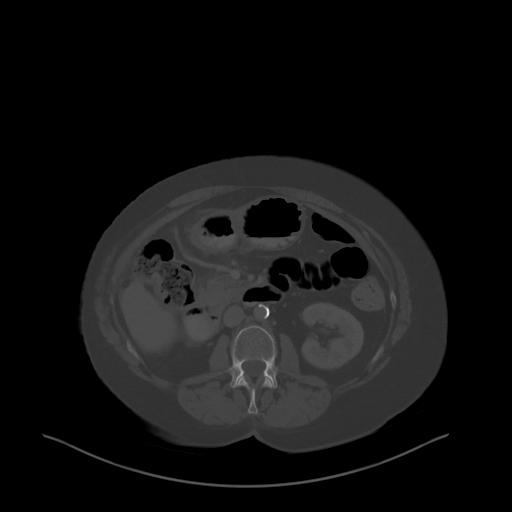
[im 62/89  soft-tissue]
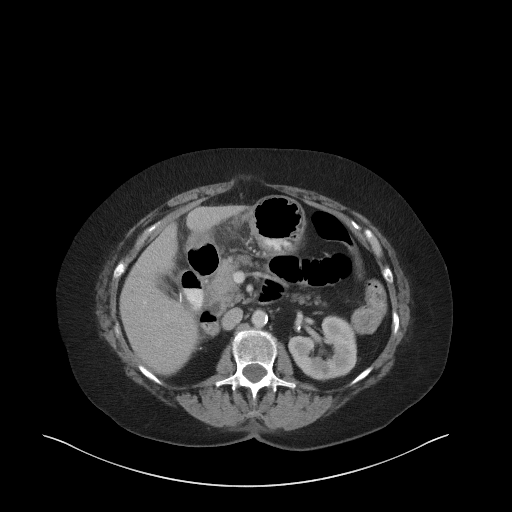
[im 71/89  soft-tissue]
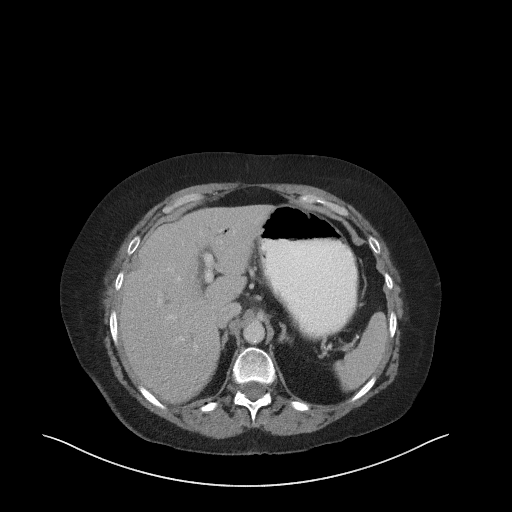
[im 75/89  soft-tissue]
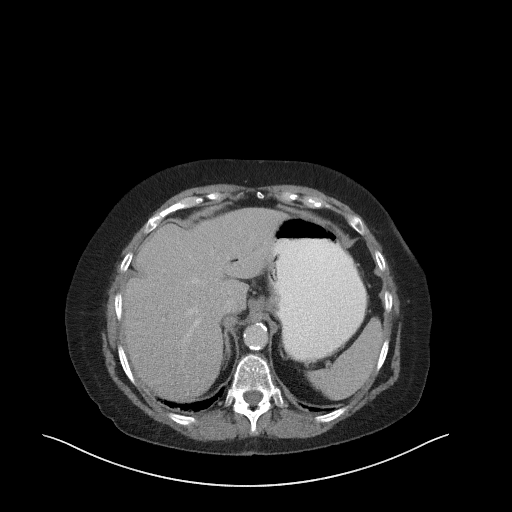
[im 84/89  soft-tissue]
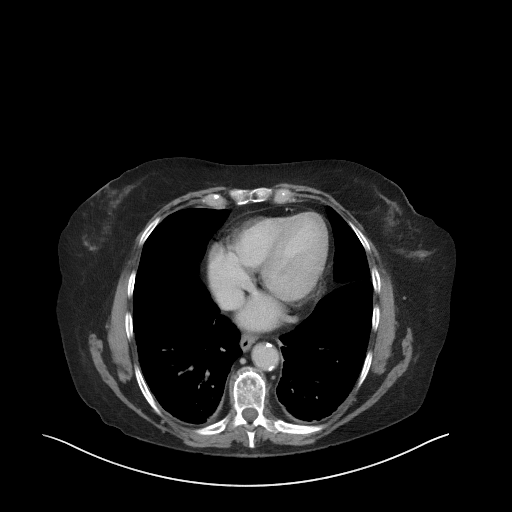

[Series 5: coronal st · coronal · 0.78mm/px · 3 of 94 slices shown]
[im 32/94  soft-tissue]
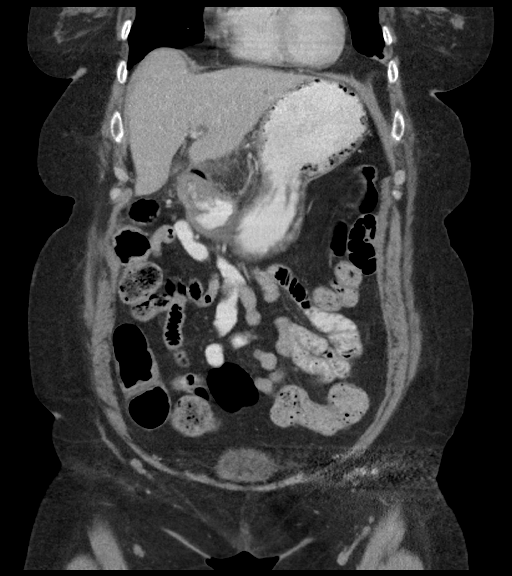
[im 42/94  soft-tissue]
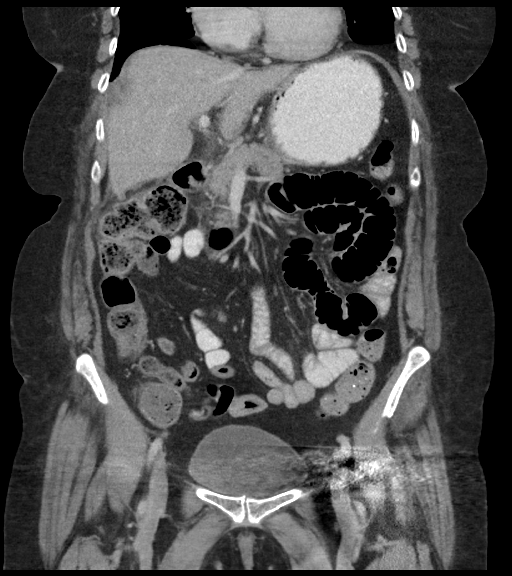
[im 52/94  soft-tissue]
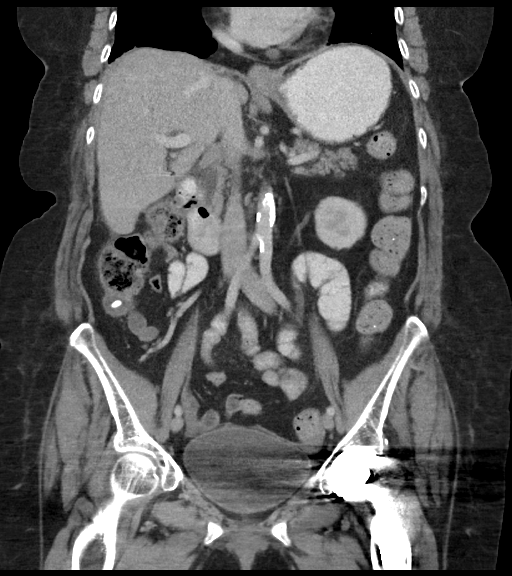

[16 of 46 positions shown; findings below may reference images not displayed]

FINDINGS: Lower chest: Minimal dependent atelectasis.  No pleural fluid.

Hepatobiliary: Post recent cholecystectomy in ERCP. Pneumobilia is
unchanged. Biliary prominence measuring 9 mm. No calcified
choledocholithiasis. No focal hepatic lesion.

Pancreas: No ductal dilatation or inflammation.

Spleen: Homogeneous attenuation without focal abnormality. Normal in
size.

Adrenals/Urinary Tract: Post right nephrectomy. Homogeneous
enhancement of the left kidney. Unchanged 8 mm peripherally
calcified renal artery aneurysm at the hilum. Subcentimeter cortical
hypodensities in the left kidney are too small to characterize. No
hydronephrosis. Ureters decompressed. Urinary bladder is
physiologically distended. Normal adrenal glands.

Stomach/Bowel: Minimal enteric contrast in the distal esophagus.
Stomach physiologically distended. No bowel obstruction or
inflammation. Moderate stool throughout the colon with colonic
redundancy. No colonic inflammation. Surgical clips at the base of
the cecum.

Vascular/Lymphatic: Aortic and branch atherosclerosis without
aneurysm. No adenopathy.

Reproductive: Status post hysterectomy. No adnexal masses.

Other: Soft tissue stranding in the right upper quadrant adjacent to
the liver, lesser curvature of the stomach and duodenum. No intra-
abdominal fluid collection or abscess. No significant ascites. No
free air. No fluid collection in the subcutaneous tissues. Small
supraumbilical fat containing hernia likely site of prior
laparoscopic port.

Musculoskeletal: Stable from prior. Left hip arthroplasty.
Degenerative change in the spine.
IMPRESSION: 1. No findings to explain left upper quadrant pain.
2. Fat stranding in the right upper quadrant of the abdomen. Given
lack of right-sided abdominal symptoms likely residual postsurgical
change from recent cholecystectomy. No intra-abdominal abscess.
3. Additional stable chronic findings as described.

## 2017-06-17 ENCOUNTER — Other Ambulatory Visit: Payer: Self-pay | Admitting: Student

## 2017-06-17 DIAGNOSIS — M7581 Other shoulder lesions, right shoulder: Secondary | ICD-10-CM

## 2017-06-17 DIAGNOSIS — M7521 Bicipital tendinitis, right shoulder: Secondary | ICD-10-CM

## 2017-06-19 ENCOUNTER — Other Ambulatory Visit: Payer: Self-pay | Admitting: Gastroenterology

## 2017-06-19 DIAGNOSIS — R1031 Right lower quadrant pain: Secondary | ICD-10-CM

## 2017-06-23 ENCOUNTER — Other Ambulatory Visit: Payer: Self-pay | Admitting: Internal Medicine

## 2017-06-23 DIAGNOSIS — E559 Vitamin D deficiency, unspecified: Secondary | ICD-10-CM

## 2017-06-23 MED ORDER — VITAMIN D3 1.25 MG (50000 UT) PO CAPS: 1.0000 | ORAL_CAPSULE | ORAL | 1 refills | Status: DC

## 2017-06-29 ENCOUNTER — Other Ambulatory Visit: Payer: Self-pay | Admitting: Internal Medicine

## 2017-06-29 DIAGNOSIS — J309 Allergic rhinitis, unspecified: Secondary | ICD-10-CM

## 2017-06-29 DIAGNOSIS — K219 Gastro-esophageal reflux disease without esophagitis: Secondary | ICD-10-CM

## 2017-06-29 MED ORDER — MONTELUKAST SODIUM 10 MG PO TABS: 10.0000 mg | ORAL_TABLET | Freq: Every day | ORAL | 3 refills | Status: DC

## 2017-06-29 MED ORDER — RANITIDINE HCL 150 MG PO CAPS: 150.0000 mg | ORAL_CAPSULE | Freq: Two times a day (BID) | ORAL | 3 refills | Status: DC

## 2017-06-30 ENCOUNTER — Ambulatory Visit
Admission: RE | Admit: 2017-06-30 | Discharge: 2017-06-30 | Disposition: A | Discharge status: Home / Self Care | Payer: Medicare Other | Source: Ambulatory Visit | Attending: Student | Admitting: Student

## 2017-06-30 DIAGNOSIS — M75101 Unspecified rotator cuff tear or rupture of right shoulder, not specified as traumatic: Secondary | ICD-10-CM | POA: Insufficient documentation

## 2017-06-30 DIAGNOSIS — M7581 Other shoulder lesions, right shoulder: Secondary | ICD-10-CM

## 2017-06-30 DIAGNOSIS — M87821 Other osteonecrosis, right humerus: Secondary | ICD-10-CM | POA: Insufficient documentation

## 2017-06-30 DIAGNOSIS — M7551 Bursitis of right shoulder: Secondary | ICD-10-CM | POA: Insufficient documentation

## 2017-06-30 DIAGNOSIS — M25411 Effusion, right shoulder: Secondary | ICD-10-CM | POA: Insufficient documentation

## 2017-06-30 DIAGNOSIS — M659 Synovitis and tenosynovitis, unspecified: Secondary | ICD-10-CM | POA: Insufficient documentation

## 2017-06-30 DIAGNOSIS — M7521 Bicipital tendinitis, right shoulder: Secondary | ICD-10-CM

## 2017-06-30 DIAGNOSIS — M19011 Primary osteoarthritis, right shoulder: Secondary | ICD-10-CM | POA: Diagnosis not present

## 2017-07-09 ENCOUNTER — Telehealth: Payer: Self-pay | Admitting: Pulmonary Disease

## 2017-07-09 MED ORDER — NEBIVOLOL HCL 10 MG PO TABS: 10.0000 mg | ORAL_TABLET | Freq: Every day | ORAL | 5 refills | Status: DC

## 2017-07-09 NOTE — Telephone Encounter (Signed)
OK, 5 refills

## 2017-07-09 NOTE — Telephone Encounter (Signed)
Pt is requesting refill on Bystolic 10mg . Last refilled by [facility]Q 07/28/16 #30 with 3 refills.  Pt has pending apt with TP on 08/03/17.  BQ please advise on refill. Thanks

## 2017-07-09 NOTE — Telephone Encounter (Signed)
Spoke with pt's husband, Richard. He is aware that this refill has been taken care of. Nothing further was needed.

## 2017-07-12 ENCOUNTER — Ambulatory Visit: Payer: Medicare Other | Admitting: Psychiatry

## 2017-07-18 ENCOUNTER — Other Ambulatory Visit: Payer: Self-pay | Admitting: Psychiatry

## 2017-07-19 ENCOUNTER — Encounter: Payer: Self-pay | Admitting: Internal Medicine

## 2017-07-19 ENCOUNTER — Ambulatory Visit: Payer: Medicare Other | Admitting: Internal Medicine

## 2017-07-19 VITALS — BP 144/62 | HR 70 | Temp 98.1°F | Ht 62.5 in | Wt 170.8 lb

## 2017-07-19 DIAGNOSIS — R739 Hyperglycemia, unspecified: Secondary | ICD-10-CM

## 2017-07-19 DIAGNOSIS — M25519 Pain in unspecified shoulder: Secondary | ICD-10-CM

## 2017-07-19 DIAGNOSIS — Z1329 Encounter for screening for other suspected endocrine disorder: Secondary | ICD-10-CM

## 2017-07-19 DIAGNOSIS — D649 Anemia, unspecified: Secondary | ICD-10-CM

## 2017-07-19 DIAGNOSIS — G8929 Other chronic pain: Secondary | ICD-10-CM | POA: Diagnosis not present

## 2017-07-19 DIAGNOSIS — E559 Vitamin D deficiency, unspecified: Secondary | ICD-10-CM | POA: Diagnosis not present

## 2017-07-19 DIAGNOSIS — I2510 Atherosclerotic heart disease of native coronary artery without angina pectoris: Secondary | ICD-10-CM

## 2017-07-19 DIAGNOSIS — Z1389 Encounter for screening for other disorder: Secondary | ICD-10-CM | POA: Diagnosis not present

## 2017-07-19 DIAGNOSIS — R61 Generalized hyperhidrosis: Secondary | ICD-10-CM

## 2017-07-19 DIAGNOSIS — I509 Heart failure, unspecified: Secondary | ICD-10-CM | POA: Diagnosis not present

## 2017-07-19 MED ORDER — TRAMADOL HCL 50 MG PO TABS: 50.0000 mg | ORAL_TABLET | Freq: Two times a day (BID) | ORAL | 2 refills | Status: DC | PRN

## 2017-07-19 NOTE — Progress Notes (Signed)
Pre visit review using our clinic review tool, if applicable. No additional management support is needed unless otherwise documented below in the visit note. 

## 2017-07-19 NOTE — Patient Instructions (Signed)
Please sch fasting labs this week  We will order echo of your heart at [facility] F/u in 3 month s  Hyperhidrosis It is normal to sweat when you are hot, being physically active, or feeling anxious. Sweating is a necessary function for your body. However, hyperhidrosis is when you sweat too much (excessively). Although hyperhidrosis is not dangerous, it can make you feel embarrassed. There are two kinds of hyperhidrosis:  Primary hyperhidrosis. The sweating usually localizes in one part of your body, such as your underarms, or in a few areas, such as your feet, face, armpits, and hands. This is the more common kind of hyperhidrosis.  Secondary hyperhidrosis. This type more likely affects your entire body.  What are the causes? The cause of your hyperhidrosis depends on the kind you have.  Primary hyperhidrosis may be caused by having sweat glands that are more active than normal.  Secondary hyperhidrosis is caused by an underlying condition. Possible conditions include: ? Diabetes. ? Gout. ? Certain medicines. ? Anxiety. ? Stroke. ? Obesity. ? Menopause. ? Overactive thyroid (hyperthyroidism). ? Tumors. ? Frostbite. ? Certain types of cancers. ? Alcoholism. ? Injury to your nervous system. ? Stroke. ? Parkinson disease.  What increases the risk? You may be at an increased risk for primary hyperhidrosis if you have a family history of it. What are the signs or symptoms? General symptoms of hyperhidrosis may include:  Feeling like you are sweating constantly, even while you are resting.  Having skin that peels or gets paler or softer in the areas where you sweat the most.  Being able to see sweat on your skin.  Symptoms of primary hyperhidrosis may include:  Sweating in specific areas, such as your armpits, palms, feet, and face.  Sweating in the same location on both sides of your body.  Sweating only during the day.  Symptoms of secondary hyperhidrosis may  include:  Sweating all over your body.  Sweating even while you sleep.  How is this diagnosed? Hyperhidrosis may be diagnosed by:  Medical history and physical exam.  Testing, such as: ? Sweat test. ? Paper test.  How is this treated? Your treatment will depend on the kind of hyperhidrosis you have and the parts of your body that are affected. If your hyperhidrosis is caused by an underlying condition, your treatment will address the cause. Treatment may include:  Strong antiperspirants. Your health care provider may give you a prescription.  Medicines taken by mouth.  Medicines injected by your health care provider. These may include small amounts of botulinum toxin.  Iontophoresis. This is a procedure that temporarily turns off the sweat glands in your hands and feet.  Surgery to remove your sweat glands.  Sympathectomy. This is a procedure that cuts or destroys your nerves so that they do not send a signal to sweat.  Follow these instructions at home:  Take medicines only as directed by your health care provider.  Use antiperspirants as directed by your health care provider.  Limit or avoid foods or beverages that seem to increase your chances of sweating, such as: ? Spicy food. ? Caffeine. ? Alcohol. ? Foods that contain MSG.  If your feet sweat: ? Wear sandals, when possible. ? Do not wear cotton socks. Wear socks that remove or wick moisture from your feet. ? Wear leather shoes. ? Avoid wearing the same pair of shoes two days in a row.  Consider joining a hyperhidrosis support group. Contact a health care provider if:  You have new symptoms.  Your symptoms get worse. This information is not intended to replace advice given to you by your health care provider. Make sure you discuss any questions you have with your health care provider. Document Released: 03/20/2005 Document Revised: 06/27/2015 Document Reviewed: 08/29/2013 Elsevier Interactive Patient  Education  [employer].

## 2017-07-22 ENCOUNTER — Other Ambulatory Visit: Payer: Medicare Other

## 2017-07-23 ENCOUNTER — Telehealth: Payer: Self-pay | Admitting: *Deleted

## 2017-07-23 NOTE — Telephone Encounter (Signed)
Copied from [place] [id number]. Topic: General - Other >> Jul 23, 2017  4:41 PM Marin Olp L wrote: Reason for CRM: Patient would like a call from Dr. Aundra Dubin and declined to provide any additional information.

## 2017-07-26 DIAGNOSIS — M7581 Other shoulder lesions, right shoulder: Secondary | ICD-10-CM | POA: Insufficient documentation

## 2017-07-26 HISTORY — DX: Other shoulder lesions, right shoulder: M75.81

## 2017-07-26 NOTE — Telephone Encounter (Signed)
Copied from [place] [id number]. Topic: General - Other >> Jul 23, 2017  4:41 PM Marin Olp L wrote: Reason for CRM: Patient would like a call from Dr. Aundra Dubin and declined to provide any additional information.  >> Jul 26, 2017  9:51 AM Neva Seat wrote: Pt called back wanting a call back from Dr. Aundra Dubin or Fransisco Beau. She states she didn't get a call back from Fri. When she called. Call her asap.

## 2017-07-28 ENCOUNTER — Encounter: Payer: Self-pay | Admitting: Internal Medicine

## 2017-07-28 NOTE — Progress Notes (Signed)
Chief Complaint  Patient presents with  . Follow-up   F/u  1. C/o sweating during day and night reviewed CT ab/pelvis 06/12/16 negative CT chest 12/04/14 benign lung nodule small airway dz and air trapping noted since 2013 lung nodule 2. Reports 08/09/17 appt with retinal specialist and goes q6 months  3. Husband recently dx'ed with dementia by neurology  4. HTN sl elevated today though for age sbp <237 on bystolic 10 mg qd 5. Chronic pain she would like refill of tramadol 50 mg bid prn  6. Per pet chronic CHF due to see cards 08/2017 will order echo   Review of Systems  Constitutional: Negative for weight loss.       Excessive sweating   HENT: Negative for hearing loss.   Eyes: Negative for blurred vision.  Respiratory: Negative for shortness of breath.   Cardiovascular: Negative for chest pain.  Gastrointestinal: Negative for abdominal pain and blood in stool.  Genitourinary: Negative for hematuria.  Musculoskeletal: Positive for joint pain.  Skin: Negative for rash.  Neurological: Negative for headaches.  Psychiatric/Behavioral: Negative for depression and memory loss.   Past Medical History:  Diagnosis Date  . Anxiety   . Asthma   . Cataract    s/p b/l repair   . Chicken pox   . CKD (chronic kidney disease)   . CKD (chronic kidney disease), stage III ([place])    a. s/p R nephrectomy.  . Conversion disorder   . COPD (chronic obstructive pulmonary disease) ([place])   . Depression   . Essential hypertension   . GERD (gastroesophageal reflux disease)   . Hyperlipidemia   . Inflammatory arthritis    a. hands/carpal tunnel.  b. Low titer rheumatoid factor. c. Negative anti-CCP antibodies. d. Plaquenil.  . Non-Obstructive CAD    a. 07/2009 Cath ([facility]): nonobs dzs;  b. 03/2011 Cath [facility]): nonobs dzs.  . Osteoarthritis    a. Knees.  . PUD (peptic ulcer disease)   . S/P right hip fracture    11/01/16 s/p repair  . Spinal stenosis at L4-L5 level    severe with L4/L5 anterolisthesis  grade 1 anterolisthesis   . Toxic maculopathy   . Valvular heart disease    a. 07/2015 Echo: EF 55-60%, Mild AI, AS, MR, and TR.   Past Surgical History:  Procedure Laterality Date  . APPENDECTOMY    . BACK SURGERY    . BUNIONECTOMY    . CATARACT EXTRACTION, BILATERAL    . CESAREAN SECTION     x1  . CHOLECYSTECTOMY N/A 05/11/2016   Procedure: LAPAROSCOPIC CHOLECYSTECTOMY;  Surgeon: Florene Glen, MD;  Location: [facility] ORS;  Service: General;  Laterality: N/A;  . COLONOSCOPY WITH PROPOFOL N/A 04/02/2016   Procedure: COLONOSCOPY WITH PROPOFOL;  Surgeon: Jonathon Bellows, MD;  Location: [facility] ENDOSCOPY;  Service: Endoscopy;  Laterality: N/A;  . ENDOSCOPIC RETROGRADE CHOLANGIOPANCREATOGRAPHY (ERCP) WITH PROPOFOL N/A 05/08/2016   Procedure: ENDOSCOPIC RETROGRADE CHOLANGIOPANCREATOGRAPHY (ERCP) WITH PROPOFOL;  Surgeon: Lucilla Lame, MD;  Location: [facility] ENDOSCOPY;  Service: Endoscopy;  Laterality: N/A;  . ERCP     with biliary spincterotomy 05/08/16 Dr. Allen Norris for choledocholithiasis   . ESOPHAGEAL DILATION  04/02/2016   Procedure: ESOPHAGEAL DILATION;  Surgeon: Jonathon Bellows, MD;  Location: [facility] ENDOSCOPY;  Service: Endoscopy;;  . ESOPHAGOGASTRODUODENOSCOPY (EGD) WITH PROPOFOL N/A 04/02/2016   Procedure: ESOPHAGOGASTRODUODENOSCOPY (EGD) WITH PROPOFOL;  Surgeon: Jonathon Bellows, MD;  Location: [facility] ENDOSCOPY;  Service: Endoscopy;  Laterality: N/A;  . HIP ARTHROPLASTY Right 11/01/2016   Procedure: ARTHROPLASTY BIPOLAR  HIP (HEMIARTHROPLASTY);  Surgeon: Corky Mull, MD;  Location: [facility] ORS;  Service: Orthopedics;  Laterality: Right;  . 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 osteoporosis     noted DEXA 08/19/16   . REPLACEMENT TOTAL KNEE Right   . TONSILLECTOMY    . TOTAL HIP ARTHROPLASTY  12/10/11   [facility] left hip  . TOTAL HIP ARTHROPLASTY Bilateral   . TUBAL LIGATION     Family History  Problem Relation Age of Onset  . Rheum arthritis Mother   . Asthma Mother   .  Parkinson's disease Mother   . Heart disease Mother   . Stroke Mother   . Hypertension Mother   . Heart attack Father   . Heart disease Father   . Hypertension Father   . Diabetes Son   . Gout Son   . Asthma Sister   . Heart disease Sister   . Lung cancer Sister   . Heart disease Sister   . Heart disease Sister   . Breast cancer Sister   . Heart attack Sister   . Heart disease Brother   . Heart disease Maternal Grandmother   . Diabetes Maternal Grandmother   . Colon cancer Maternal Grandmother   . Cancer Maternal Grandmother        Hodgkins lymphoma  . Heart disease Brother   . Alcohol abuse Brother   . Depression Brother   . Dementia Son    Social History   Socioeconomic History  . Marital status: Married    Spouse name: richard  . Number of children: 2  . Years of education: Some Coll  . Highest education level: Some college, no degree  Occupational History  . Occupation: Retired    Comment: retired  Scientific laboratory technician  . Financial resource strain: Very hard  . Food insecurity:    Worry: Often true    Inability: Often true  . Transportation needs:    Medical: No    Non-medical: No  Tobacco Use  . Smoking status: Former Smoker    Packs/day: 0.50    Years: 20.00    Pack years: 10.00    Types: Cigarettes    Last attempt to quit: 02/02/1974    Years since quitting: 43.5  . Smokeless tobacco: Never Used  Substance and Sexual Activity  . Alcohol use: No  . Drug use: No  . Sexual activity: Not Currently  Lifestyle  . Physical activity:    Days per week: 0 days    Minutes per session: 0 min  . Stress: Not on file  Relationships  . Social connections:    Talks on phone: More than three times a week    Gets together: Never    Attends religious service: More than 4 times per year    Active member of club or organization: No    Attends meetings of clubs or organizations: Never    Relationship status: Married  . Intimate partner violence:    Fear of current or ex  partner: No    Emotionally abused: No    Physically abused: No    Forced sexual activity: No  Other Topics Concern  . Not on file  Social History Narrative   Lives at home in [place] with her husband and grandson.   Right-handed.   6 cups coffee per day.   Current Meds  Medication Sig  . albuterol (VENTOLIN HFA) 108 (90 Base) MCG/ACT inhaler Inhale  2 puffs into the lungs every 6 (six) hours as needed.  . Cholecalciferol (VITAMIN D3) 50000 units CAPS Take 1 capsule by mouth once a week. Same day  . clotrimazole (LOTRIMIN) 1 % cream Apply 1 application topically 2 (two) times daily. Bid to abdomen 7-10 days  . escitalopram (LEXAPRO) 10 MG tablet Take 1 tablet (10 mg total) by mouth daily.  . fluticasone furoate-vilanterol (BREO ELLIPTA) 100-25 MCG/INH AEPB Inhale 1 puff into the lungs daily. Rinse mouth  . folic acid (FOLVITE) 1 MG tablet Take 1 mg by mouth daily.  Marland Kitchen gabapentin (NEURONTIN) 300 MG capsule TAKE 1 CAPSULE BY MOUTH FOUR TIMES DAILY  . lamoTRIgine (LAMICTAL) 100 MG tablet Take 1 tablet (100 mg total) by mouth 2 (two) times daily.  Marland Kitchen leflunomide (ARAVA) 10 MG tablet Take 10 mg by mouth daily.   Marland Kitchen LORazepam (ATIVAN) 0.5 MG tablet Take 1/2 pill at bed time  . lovastatin (MEVACOR) 20 MG tablet TAKE 1 TABLET BY MOUTH AT BEDTIME  . Melatonin 3 MG TABS Take 1 tablet (3 mg total) by mouth at bedtime.  . montelukast (SINGULAIR) 10 MG tablet Take 1 tablet (10 mg total) by mouth daily.  . multivitamin-lutein (OCUVITE-LUTEIN) CAPS capsule Take 1 capsule by mouth 2 (two) times daily.  . nebivolol (BYSTOLIC) 10 MG tablet Take 1 tablet (10 mg total) by mouth daily.  Marland Kitchen olopatadine (PATANOL) 0.1 % ophthalmic solution Apply to eye.  . ondansetron (ZOFRAN) 4 MG tablet Take 1 tablet (4 mg total) by mouth every 8 (eight) hours as needed for nausea or vomiting.  Marland Kitchen oxyCODONE (OXY IR/ROXICODONE) 5 MG immediate release tablet Take 1 tablet (5 mg total) by mouth every 6 (six) hours as needed for  breakthrough pain.  . pantoprazole (PROTONIX) 40 MG tablet TAKE 1 TABLET BY MOUTH TWICE A DAY  . potassium chloride (K-DUR) 10 MEQ tablet take 1 tablet by mouth once daily  . QUEtiapine (SEROQUEL) 25 MG tablet Take 1 tablet (25 mg total) by mouth at bedtime.  . ranitidine (ZANTAC) 150 MG capsule Take 1 capsule (150 mg total) by mouth 2 (two) times daily.  . sucralfate (CARAFATE) 1 g tablet TAKE 1 TABLET BY MOUTH 4 TIMES A DAY WITH MEALS AND AT BEDTIME  . Teriparatide, Recombinant, (FORTEO) 600 MCG/2.4ML SOLN Inject 0.08 mLs (20 mcg total) into the skin daily.  . traMADol (ULTRAM) 50 MG tablet Take 1 tablet (50 mg total) by mouth every 12 (twelve) hours as needed.  . [DISCONTINUED] traMADol (ULTRAM) 50 MG tablet TAKE 1 TABLET BY MOUTH EVERY 6 HOURS IF NEEDED FOR PAIN   Allergies  Allergen Reactions  . Ceftin [Cefuroxime Axetil] Anaphylaxis    Other reaction(s): Other (See Comments) REACTION: tongue and throat swell Other Reaction: TONGUE AND THROAT SWELLING REACTION: tongue and throat swell  . Lisinopril Anaphylaxis    REACTION: tongue and throat swelling (onset 10-10-09)  . Morphine Other (See Comments)    Per patient, low blood pressure issues, following morphine, that requires action to raise it back up. Drop in BP - can take small infrequent doses  . Sulfasalazine Anaphylaxis    Swelling tongue, lips, and jaws  . Aspirin Other (See Comments)    Sulfasalazine allergy cross reacts  . Antihistamines, Chlorpheniramine-Type     REACTION: makes pt hyper  . Antivert [Meclizine Hcl]     Other reaction(s): Unknown REACTION: bladder will not empty REACTION: bladder will not empty  . Decongestant [Pseudoephedrine Hcl]     Other reaction(s): Unknown  REACTION: makes pt hyper REACTION: makes pt hyper  . Doxycycline     REACTION: GI upset  . Morphine And Related Other (See Comments)    Per patient, low blood pressure issues, following morphine, that requires action to raise it back up.  .  Polymyxin B     Medication was in eye drops.  . Pseudoephedrine Hcl Other (See Comments)    REACTION: makes pt hyper  . Sulfa Antibiotics     Face swelling  . Xarelto [Rivaroxaban]     Other reaction(s): Other (See Comments) GI bleed Stomach burning, bleeding, and tar in stool GI bleed  . Adhesive [Tape] Rash  . Iodine Hives and Rash  . Levaquin [Levofloxacin In D5w] Rash  . Tetanus Toxoid Adsorbed Other (See Comments)    REACTION: rash, fever, hot to touch at injection site  . Tetanus Toxoids     REACTION: rash, fever, hot to touch at injection site   No results found for this or any previous visit (from the past 2160 hour(s)). Objective  Body mass index is 30.74 kg/m. Wt Readings from Last 3 Encounters:  07/19/17 170 lb 12.8 oz (77.5 kg)  05/14/17 168 lb (76.2 kg)  03/10/17 159 lb 3.2 oz (72.2 kg)   Temp Readings from Last 3 Encounters:  07/19/17 98.1 F (36.7 C) (Oral)  05/14/17 98.7 F (37.1 C) (Oral)  03/10/17 97.8 F (36.6 C) (Oral)   BP Readings from Last 3 Encounters:  07/19/17 (!) 144/62  05/14/17 (!) 104/50  03/10/17 (!) 130/58   Pulse Readings from Last 3 Encounters:  07/19/17 70  05/14/17 73  03/10/17 67    Physical Exam  Constitutional: She is oriented to person, place, and time. Vital signs are normal. She appears well-developed and well-nourished. She is cooperative.  HENT:  Head: Normocephalic and atraumatic.  Mouth/Throat: Oropharynx is clear and moist and mucous membranes are normal.  Eyes: Pupils are equal, round, and reactive to light. Conjunctivae are normal.  Cardiovascular: Normal rate, regular rhythm and normal heart sounds.  Pulmonary/Chest: Effort normal and breath sounds normal.  Neurological: She is alert and oriented to person, place, and time. Gait normal.  Skin: Skin is warm, dry and intact.  Psychiatric: She has a normal mood and affect. Her speech is normal and behavior is normal. Judgment and thought content normal. Cognition  and memory are normal.  Nursing note and vitals reviewed.   Assessment   1. Excessive sweating no etiology see HPI  2. HTN slightly elevated will monitor controlled for age sbp M150  3. Chronic pain (I.e shoulders) 4. Retina problems  5. HM 6. Chronic CHF, h/o CAD  Plan   1. Will check labs comprehensive pt to sch  2. Cont meds  3. Refilled tramadol  4. F/u 7/8 with retinal specialist q6 months  5.  Had flu, prevnar, pna 23, allergic to Tdap, declines shingrix   mammo 01/19/17 negative  Colonoscopy 04/02/16 multiple polyps but tortuous colon GI does not want to do any further  Out of age window pap  DEXA 08/19/16 +osteoporosis on Forteo will need to repeat 1 year after Forteo use to see improvement rheumatology following.  -will need repeat DEXA  Seeing dermatology in [facility] specialist 08/2017  Do lipid with next labs   6. Echo ordered  F/u [facility] cards 08/2017    Provider: Dr. Olivia Mackie McLean-Scocuzza-Internal Medicine

## 2017-08-03 ENCOUNTER — Ambulatory Visit: Payer: Medicare Other | Admitting: Adult Health

## 2017-08-03 IMAGING — MR MR LUMBAR SPINE W/O CM
4 of 5 series · 26 of 48 positions shown · non-contrast
Comparison: CT 06/12/2016

CLINICAL DATA: Low back pain over the last 6 months. Bilateral leg
pain.

EXAM:
MRI LUMBAR SPINE WITHOUT CONTRAST
TECHNIQUE: Multiplanar, multisequence MR imaging of the lumbar spine was
performed. No intravenous contrast was administered.

[Series 2: T2 · sagittal · 4.0mm · 0.81mm/px · 6 of 15 slices shown (1 of 2)]
[im 1/15]
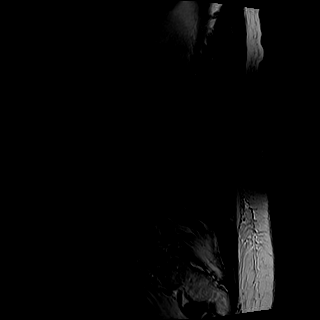
[im 3/15]
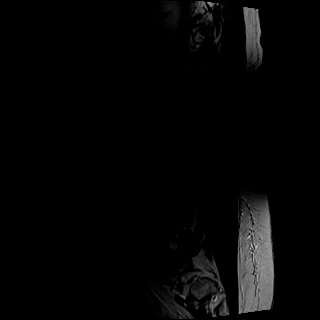
[im 6/15]
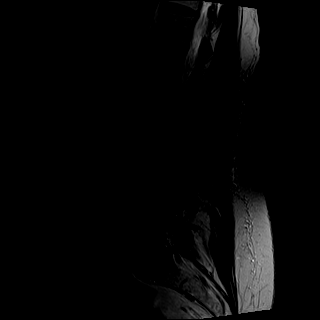
[im 9/15]
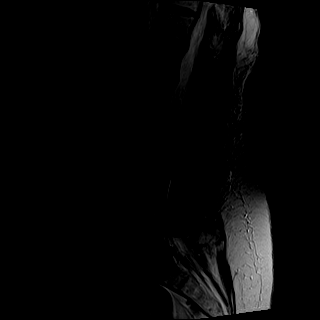
[im 12/15]
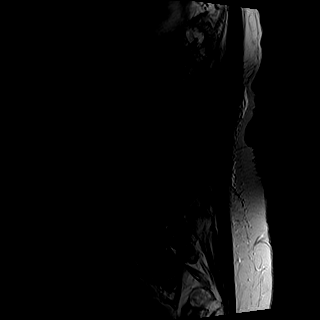
[im 15/15]
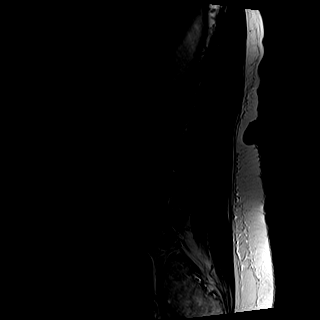

[Series 3: T1 · sagittal · 4.0mm · 0.41mm/px · 6 of 15 slices shown (1 of 2)]
[im 1/15]
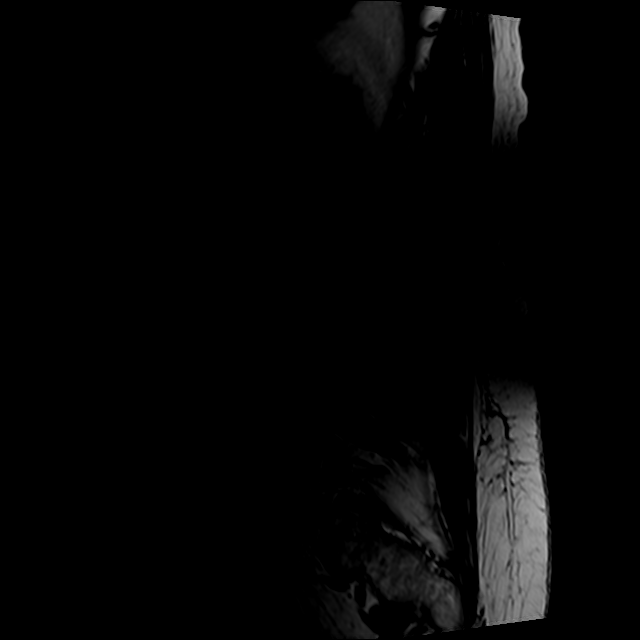
[im 3/15]
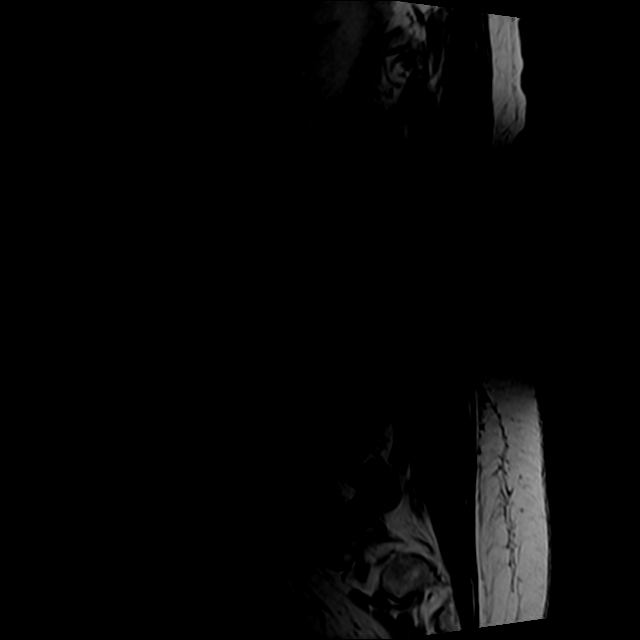
[im 6/15]
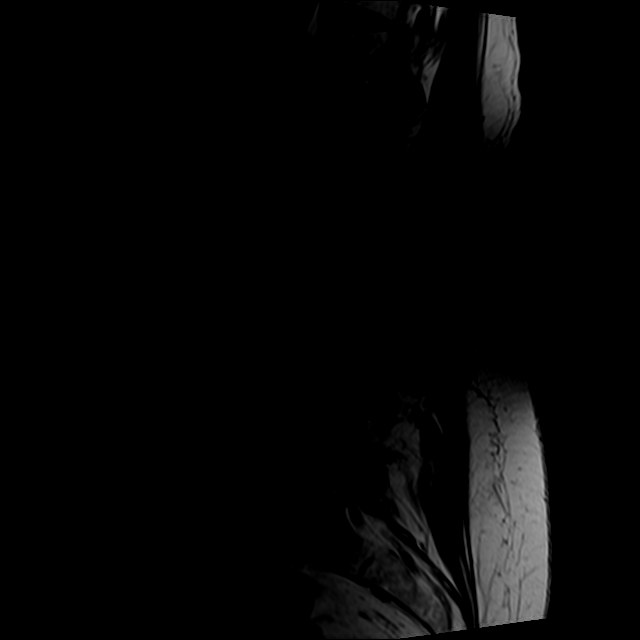
[im 9/15]
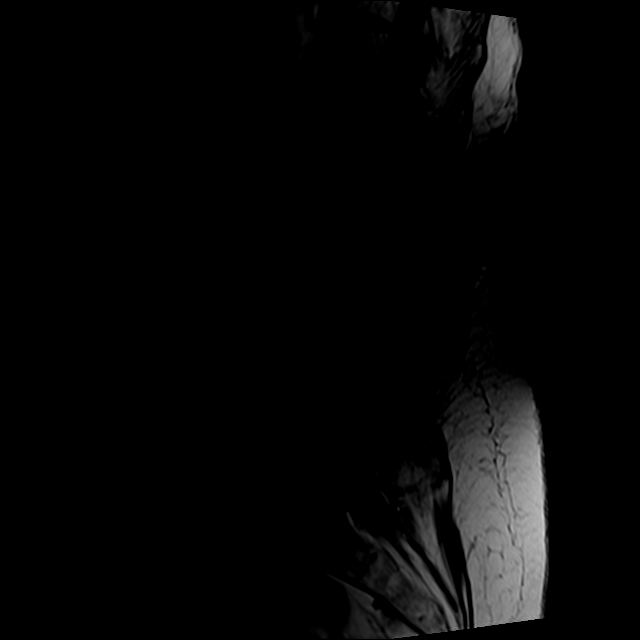
[im 12/15]
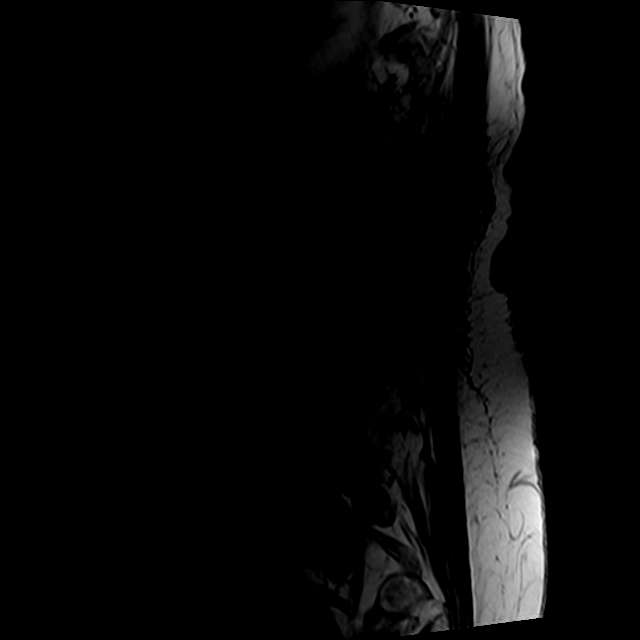
[im 15/15]
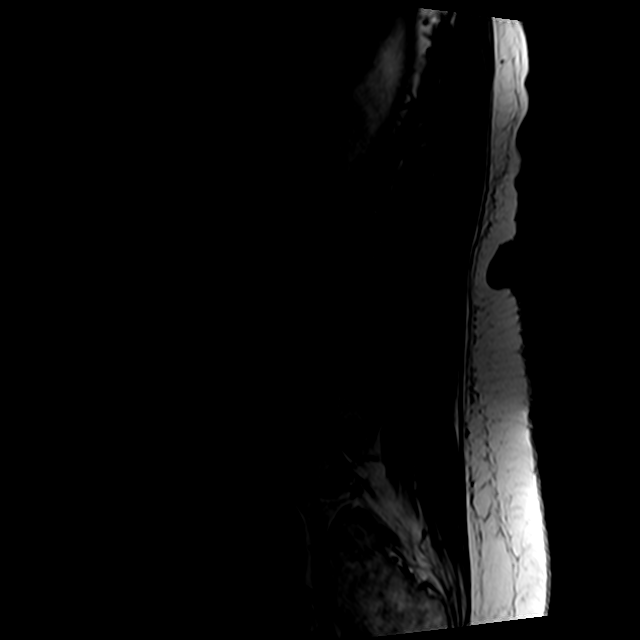

[Series 5: T2 · axial · 4.0mm · 0.78mm/px · z∈[-96,+118]mm · 9 of 35 slices shown (2 of 2)]
[im 1/35]
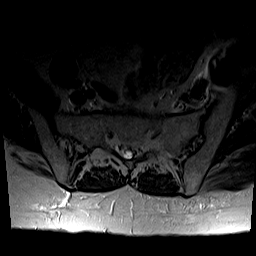
[im 5/35]
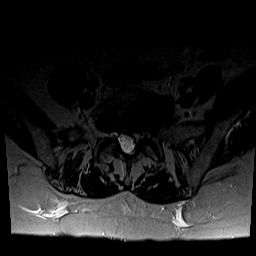
[im 10/35]
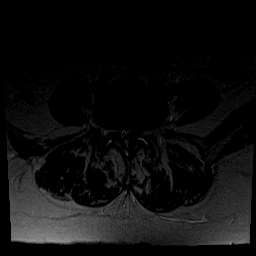
[im 15/35]
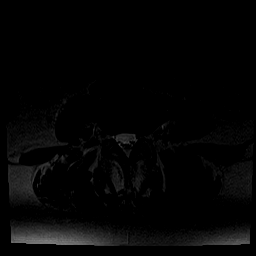
[im 18/35]
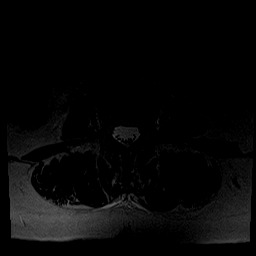
[im 20/35]
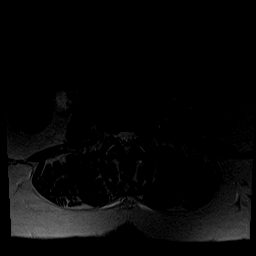
[im 25/35]
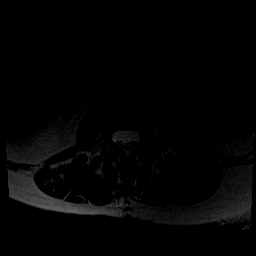
[im 30/35]
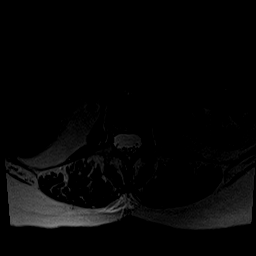
[im 35/35]
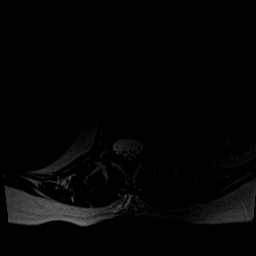

[Series 6: T1 · axial · 4.0mm · 0.39mm/px · z∈[-96,+93]mm · 5 of 35 slices shown (2 of 2)]
[im 1/35]
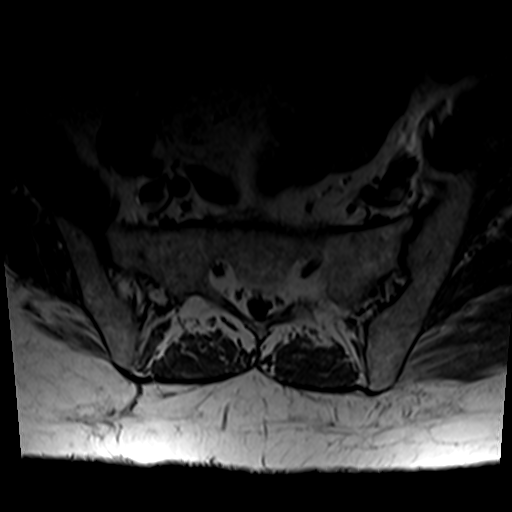
[im 5/35]
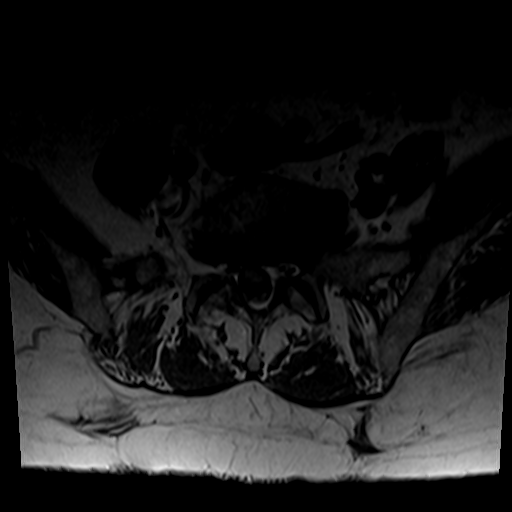
[im 10/35]
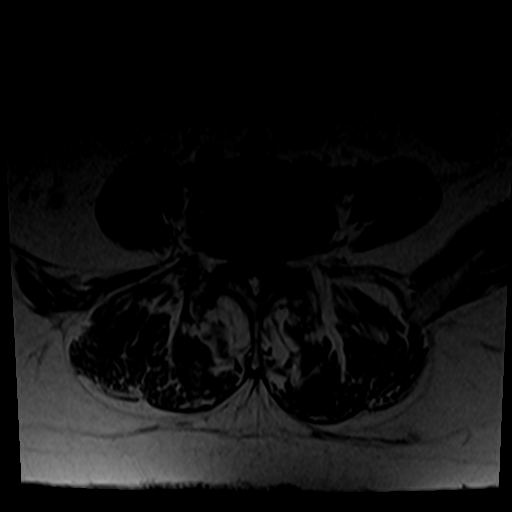
[im 18/35]
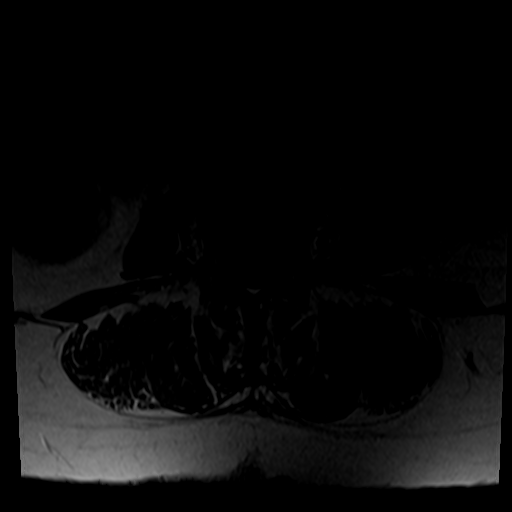
[im 30/35]
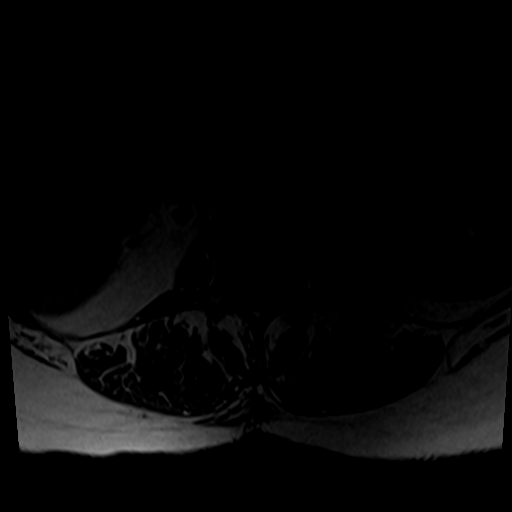

[26 of 48 positions shown; findings below may reference images not displayed]

FINDINGS: Segmentation:  5 lumbar type vertebral bodies.

Alignment:  8 mm anterolisthesis L4-5.

Vertebrae:  No fracture or primary bone lesion.

Conus medullaris: Extends to the T12 level and appears normal.

Paraspinal and other soft tissues: Absent right kidney.

Disc levels:

No significant finding at T11-12, T12-L1 or L1-2.

L2-3: Chronic disc degeneration with loss of disc height. Mild
bulging of the disc. No compressive stenosis.

L3-4:  Mild bulging of the disc.  No stenosis.

L4-5: Advanced bilateral facet arthropathy with 8 mm
anterolisthesis. Pseudo disc herniation. Severe spinal stenosis that
could cause neural compression on either or both sides. The facet
arthropathy could also contribute to low back pain.

L5-S1: Chronic disc degeneration with loss of height. Endplate
osteophytes and bulging of the disc. No central canal stenosis.
Foraminal stenosis on the left because of encroachment by
osteophytes and/or chronic disc herniation.
IMPRESSION: L4-5: Advanced bilateral facet arthropathy with 8 mm
anterolisthesis. Pseudo disc herniation. Severe multifactorial
spinal stenosis likely to cause neural compression. The facet
arthropathy could also contribute to low back pain.

L5-S1: Chronic disc degeneration. Foraminal narrowing on the left
because of encroachment by osteophyte and or chronic disc
herniation.

## 2017-08-06 ENCOUNTER — Other Ambulatory Visit (INDEPENDENT_AMBULATORY_CARE_PROVIDER_SITE_OTHER): Payer: Medicare Other

## 2017-08-06 DIAGNOSIS — E559 Vitamin D deficiency, unspecified: Secondary | ICD-10-CM

## 2017-08-06 DIAGNOSIS — I509 Heart failure, unspecified: Secondary | ICD-10-CM

## 2017-08-06 DIAGNOSIS — Z1389 Encounter for screening for other disorder: Secondary | ICD-10-CM

## 2017-08-06 DIAGNOSIS — R739 Hyperglycemia, unspecified: Secondary | ICD-10-CM

## 2017-08-06 DIAGNOSIS — I2510 Atherosclerotic heart disease of native coronary artery without angina pectoris: Secondary | ICD-10-CM

## 2017-08-06 DIAGNOSIS — D649 Anemia, unspecified: Secondary | ICD-10-CM

## 2017-08-06 DIAGNOSIS — R61 Generalized hyperhidrosis: Secondary | ICD-10-CM

## 2017-08-06 NOTE — Addendum Note (Signed)
Addended by: Arby Barrette on: 08/06/2017 09:17 AM   Modules accepted: Orders

## 2017-08-06 NOTE — Addendum Note (Signed)
Addended by: Arby Barrette on: 08/06/2017 09:10 AM   Modules accepted: Orders

## 2017-08-07 LAB — URINALYSIS, ROUTINE W REFLEX MICROSCOPIC
Bilirubin, UA: NEGATIVE
Glucose, UA: NEGATIVE
Ketones, UA: NEGATIVE
Leukocytes, UA: NEGATIVE
Nitrite, UA: NEGATIVE
Protein, UA: NEGATIVE
RBC, UA: NEGATIVE
Specific Gravity, UA: 1.017 (ref 1.005–1.030)
Urobilinogen, Ur: 0.2 mg/dL (ref 0.2–1.0)
pH, UA: 7 (ref 5.0–7.5)

## 2017-08-09 ENCOUNTER — Other Ambulatory Visit: Payer: Self-pay

## 2017-08-09 ENCOUNTER — Ambulatory Visit: Payer: Medicare Other | Admitting: Psychiatry

## 2017-08-09 ENCOUNTER — Encounter: Payer: Self-pay | Admitting: Psychiatry

## 2017-08-09 VITALS — BP 124/64 | HR 73 | Temp 98.0°F | Wt 170.4 lb

## 2017-08-09 DIAGNOSIS — F3160 Bipolar disorder, current episode mixed, unspecified: Secondary | ICD-10-CM

## 2017-08-09 DIAGNOSIS — F411 Generalized anxiety disorder: Secondary | ICD-10-CM | POA: Diagnosis not present

## 2017-08-09 LAB — CBC WITH DIFFERENTIAL/PLATELET
Basophils Absolute: 125 cells/uL (ref 0–200)
Basophils Relative: 1.1 %
Eosinophils Absolute: 1072 cells/uL — ABNORMAL HIGH (ref 15–500)
Eosinophils Relative: 9.4 %
HCT: 41.9 % (ref 35.0–45.0)
Hemoglobin: 13.8 g/dL (ref 11.7–15.5)
Lymphs Abs: 2314 cells/uL (ref 850–3900)
MCH: 28.5 pg (ref 27.0–33.0)
MCHC: 32.9 g/dL (ref 32.0–36.0)
MCV: 86.4 fL (ref 80.0–100.0)
MPV: 12.9 fL — ABNORMAL HIGH (ref 7.5–12.5)
Monocytes Relative: 8 %
Neutro Abs: 6977 cells/uL (ref 1500–7800)
Neutrophils Relative %: 61.2 %
Platelets: 224 10*3/uL (ref 140–400)
RBC: 4.85 10*6/uL (ref 3.80–5.10)
RDW: 13.4 % (ref 11.0–15.0)
Total Lymphocyte: 20.3 %
WBC mixed population: 912 cells/uL (ref 200–950)
WBC: 11.4 10*3/uL — ABNORMAL HIGH (ref 3.8–10.8)

## 2017-08-09 LAB — COMPREHENSIVE METABOLIC PANEL
AG Ratio: 1.8 (calc) (ref 1.0–2.5)
ALT: 11 U/L (ref 6–29)
AST: 17 U/L (ref 10–35)
Albumin: 4.1 g/dL (ref 3.6–5.1)
Alkaline phosphatase (APISO): 111 U/L (ref 33–130)
BUN/Creatinine Ratio: 17 (calc) (ref 6–22)
BUN: 22 mg/dL (ref 7–25)
CO2: 23 mmol/L (ref 20–32)
Calcium: 9.4 mg/dL (ref 8.6–10.4)
Chloride: 101 mmol/L (ref 98–110)
Creat: 1.27 mg/dL — ABNORMAL HIGH (ref 0.60–0.93)
Globulin: 2.3 g/dL (calc) (ref 1.9–3.7)
Glucose, Bld: 112 mg/dL — ABNORMAL HIGH (ref 65–99)
Potassium: 5.1 mmol/L (ref 3.5–5.3)
Sodium: 140 mmol/L (ref 135–146)
Total Protein: 6.4 g/dL (ref 6.1–8.1)

## 2017-08-09 LAB — LIPID PANEL
Cholesterol: 162 mg/dL (ref <200)
HDL: 42 mg/dL — ABNORMAL LOW (ref >50)
LDL Cholesterol (Calc): 86 mg/dL (calc)
Non-HDL Cholesterol (Calc): 120 mg/dL (calc) (ref <130)
Total CHOL/HDL Ratio: 3.9 (calc) (ref <5.0)
Triglycerides: 262 mg/dL — ABNORMAL HIGH (ref <150)

## 2017-08-09 LAB — TSH: TSH: 2.72 mIU/L (ref 0.40–4.50)

## 2017-08-09 LAB — IRON,TIBC AND FERRITIN PANEL
Ferritin: 252 ng/mL (ref 16–288)
Iron: 69 ug/dL (ref 45–160)

## 2017-08-09 LAB — HEMOGLOBIN A1C
Hgb A1c MFr Bld: 5.6 % of total Hgb (ref <5.7)
Mean Plasma Glucose: 114 (calc)
eAG (mmol/L): 6.3 (calc)

## 2017-08-09 LAB — VITAMIN D 25 HYDROXY (VIT D DEFICIENCY, FRACTURES): Vit D, 25-Hydroxy: 53 ng/mL (ref 30–100)

## 2017-08-09 MED ORDER — QUETIAPINE FUMARATE 25 MG PO TABS: 25.0000 mg | ORAL_TABLET | Freq: Every day | ORAL | 3 refills | Status: DC

## 2017-08-09 MED ORDER — LAMOTRIGINE 100 MG PO TABS: 100.0000 mg | ORAL_TABLET | Freq: Two times a day (BID) | ORAL | 3 refills | Status: DC

## 2017-08-09 MED ORDER — ESCITALOPRAM OXALATE 10 MG PO TABS: 10.0000 mg | ORAL_TABLET | Freq: Every day | ORAL | 3 refills | Status: DC

## 2017-08-09 MED ORDER — MELATONIN 10 MG PO TABS: 10.0000 mg | ORAL_TABLET | Freq: Every day | ORAL | 3 refills | Status: DC

## 2017-08-09 MED ORDER — LORAZEPAM 0.5 MG PO TABS: ORAL_TABLET | ORAL | 5 refills | Status: DC

## 2017-08-09 NOTE — Progress Notes (Signed)
Psychiatric MD Progress Note  Patient Identification: Kerri Carter MRN:  [id number] Date of Evaluation:  08/09/2017 Referral Source: PCP Chief Complaint:   Chief Complaint    Follow-up; Medication Refill     Visit Diagnosis:    ICD-10-CM   1. Mixed bipolar I disorder (HCC) F31.60   2. Anxiety state F41.1     History of Present Illness:   Patient is a 77 year old white female who presented for follow up. Patient reported that she feels that the current combination of medication has been helping her and her mood symptoms have not started improving.  Patient reported that she ran out of her Seroquel last week and was not able to sleep.  She has increased the dose of melatonin to 10 mg.  She continues to have difficulty sleeping without the Seroquel.  She reported that that she is combining the Seroquel and the melatonin together they are helpful.  She reported that her husband has been diagnosed with mild dementia and he is unable to drive and she is the designated driver.  She does not have any memory issues.  She reported that she has been taking care of him at this time.  Patient reported that she feels that she does not have any anxiety and her depression and anxiety are under control.  She currently denied having any suicidal homicidal ideations or plans.  She remains pleasant and cooperative.   Initially when she presented her blood pressure was elevated but on recheck her blood pressure was under control.  She reported that she follows with her primary care physician on a regular basis.  She is able to contract for safety at this time.    She appears pleasant and cooperative during the interview.    Associated Signs/Symptoms: Depression Symptoms:  anxiety, (Hypo) Manic Symptoms:  Labiality of Mood, Anxiety Symptoms:  Excessive Worry, Psychotic Symptoms:  none PTSD Symptoms: Negative NA  Past Psychiatric History: H/o conversion disorder.  Previous Psychotropic Medications:   Trazodone  ambien lexapro lamictal   Substance Abuse History in the last 12 months:  No.  Consequences of Substance Abuse: Negative NA  Past Medical History:  Past Medical History:  Diagnosis Date  . Anxiety   . Asthma   . Cataract    s/p b/l repair   . Chicken pox   . CKD (chronic kidney disease)   . CKD (chronic kidney disease), stage III ([facility])    a. s/p R nephrectomy.  . Conversion disorder   . COPD (chronic obstructive pulmonary disease) ([place])   . Depression   . Essential hypertension   . GERD (gastroesophageal reflux disease)   . Hyperlipidemia   . Inflammatory arthritis    a. hands/carpal tunnel.  b. Low titer rheumatoid factor. c. Negative anti-CCP antibodies. d. Plaquenil.  . Non-Obstructive CAD    a. 07/2009 Cath ([facility]): nonobs dzs;  b. 03/2011 Cath [facility]): nonobs dzs.  . Osteoarthritis    a. Knees.  . PUD (peptic ulcer disease)   . S/P right hip fracture    11/01/16 s/p repair  . Spinal stenosis at L4-L5 level    severe with L4/L5 anterolisthesis grade 1 anterolisthesis   . Toxic maculopathy   . Valvular heart disease    a. 07/2015 Echo: EF 55-60%, Mild AI, AS, MR, and TR.    Past Surgical History:  Procedure Laterality Date  . APPENDECTOMY    . BACK SURGERY    . BUNIONECTOMY    . CATARACT EXTRACTION, BILATERAL    .  CESAREAN SECTION     x1  . CHOLECYSTECTOMY N/A 05/11/2016   Procedure: LAPAROSCOPIC CHOLECYSTECTOMY;  Surgeon: Florene Glen, MD;  Location: [facility] ORS;  Service: General;  Laterality: N/A;  . COLONOSCOPY WITH PROPOFOL N/A 04/02/2016   Procedure: COLONOSCOPY WITH PROPOFOL;  Surgeon: Jonathon Bellows, MD;  Location: [facility] ENDOSCOPY;  Service: Endoscopy;  Laterality: N/A;  . ENDOSCOPIC RETROGRADE CHOLANGIOPANCREATOGRAPHY (ERCP) WITH PROPOFOL N/A 05/08/2016   Procedure: ENDOSCOPIC RETROGRADE CHOLANGIOPANCREATOGRAPHY (ERCP) WITH PROPOFOL;  Surgeon: Lucilla Lame, MD;  Location: [facility] ENDOSCOPY;  Service: Endoscopy;  Laterality: N/A;  . ERCP     with biliary  spincterotomy 05/08/16 Dr. Allen Norris for choledocholithiasis   . ESOPHAGEAL DILATION  04/02/2016   Procedure: ESOPHAGEAL DILATION;  Surgeon: Jonathon Bellows, MD;  Location: [facility] ENDOSCOPY;  Service: Endoscopy;;  . ESOPHAGOGASTRODUODENOSCOPY (EGD) WITH PROPOFOL N/A 04/02/2016   Procedure: ESOPHAGOGASTRODUODENOSCOPY (EGD) WITH PROPOFOL;  Surgeon: Jonathon Bellows, MD;  Location: [facility] ENDOSCOPY;  Service: Endoscopy;  Laterality: N/A;  . HIP ARTHROPLASTY Right 11/01/2016   Procedure: ARTHROPLASTY BIPOLAR HIP (HEMIARTHROPLASTY);  Surgeon: Corky Mull, MD;  Location: [facility] ORS;  Service: Orthopedics;  Laterality: Right;  . 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 osteoporosis     noted DEXA 08/19/16   . REPLACEMENT TOTAL KNEE Right   . TONSILLECTOMY    . TOTAL HIP ARTHROPLASTY  12/10/11   [facility] left hip  . TOTAL HIP ARTHROPLASTY Bilateral   . TUBAL LIGATION      Family Psychiatric History:  denied  Family History:  Family History  Problem Relation Age of Onset  . Rheum arthritis Mother   . Asthma Mother   . Parkinson's disease Mother   . Heart disease Mother   . Stroke Mother   . Hypertension Mother   . Heart attack Father   . Heart disease Father   . Hypertension Father   . Diabetes Son   . Gout Son   . Asthma Sister   . Heart disease Sister   . Lung cancer Sister   . Heart disease Sister   . Heart disease Sister   . Breast cancer Sister   . Heart attack Sister   . Heart disease Brother   . Heart disease Maternal Grandmother   . Diabetes Maternal Grandmother   . Colon cancer Maternal Grandmother   . Cancer Maternal Grandmother        Hodgkins lymphoma  . Heart disease Brother   . Alcohol abuse Brother   . Depression Brother   . Dementia Son     Social History:   Social History   Socioeconomic History  . Marital status: Married    Spouse name: richard  . Number of children: 2  . Years of education: Some Coll  . Highest education level:  Some college, no degree  Occupational History  . Occupation: Retired    Comment: retired  Scientific laboratory technician  . Financial resource strain: Very hard  . Food insecurity:    Worry: Often true    Inability: Often true  . Transportation needs:    Medical: No    Non-medical: No  Tobacco Use  . Smoking status: Former Smoker    Packs/day: 0.50    Years: 20.00    Pack years: 10.00    Types: Cigarettes    Last attempt to quit: 02/02/1974    Years since quitting: 43.5  . Smokeless tobacco: Never Used  Substance and Sexual Activity  . Alcohol  use: No  . Drug use: No  . Sexual activity: Not Currently  Lifestyle  . Physical activity:    Days per week: 0 days    Minutes per session: 0 min  . Stress: Not on file  Relationships  . Social connections:    Talks on phone: More than three times a week    Gets together: Never    Attends religious service: More than 4 times per year    Active member of club or organization: No    Attends meetings of clubs or organizations: Never    Relationship status: Married  Other Topics Concern  . Not on file  Social History Narrative   Lives at home in [place] with her husband and grandson.   Right-handed.   6 cups coffee per day.    Additional Social History:  Married Lives with husband, grand son and his fiance.   Allergies:   Allergies  Allergen Reactions  . Ceftin [Cefuroxime Axetil] Anaphylaxis    Other reaction(s): Other (See Comments) REACTION: tongue and throat swell Other Reaction: TONGUE AND THROAT SWELLING REACTION: tongue and throat swell  . Lisinopril Anaphylaxis    REACTION: tongue and throat swelling (onset 10-10-09)  . Morphine Other (See Comments)    Per patient, low blood pressure issues, following morphine, that requires action to raise it back up. Drop in BP - can take small infrequent doses  . Sulfasalazine Anaphylaxis    Swelling tongue, lips, and jaws  . Aspirin Other (See Comments)    Sulfasalazine allergy cross  reacts  . Antihistamines, Chlorpheniramine-Type     REACTION: makes pt hyper  . Antivert [Meclizine Hcl]     Other reaction(s): Unknown REACTION: bladder will not empty REACTION: bladder will not empty  . Decongestant [Pseudoephedrine Hcl]     Other reaction(s): Unknown REACTION: makes pt hyper REACTION: makes pt hyper  . Doxycycline     REACTION: GI upset  . Morphine And Related Other (See Comments)    Per patient, low blood pressure issues, following morphine, that requires action to raise it back up.  . Polymyxin B     Medication was in eye drops.  . Pseudoephedrine Hcl Other (See Comments)    REACTION: makes pt hyper  . Sulfa Antibiotics     Face swelling  . Xarelto [Rivaroxaban]     Other reaction(s): Other (See Comments) GI bleed Stomach burning, bleeding, and tar in stool GI bleed  . Adhesive [Tape] Rash  . Iodine Hives and Rash  . Levaquin [Levofloxacin In D5w] Rash  . Tetanus Toxoid Adsorbed Other (See Comments)    REACTION: rash, fever, hot to touch at injection site  . Tetanus Toxoids     REACTION: rash, fever, hot to touch at injection site    Metabolic Disorder Labs: Lab Results  Component Value Date   HGBA1C 5.6 08/06/2017   MPG 114 08/06/2017   MPG 117 05/07/2016   No results found for: PROLACTIN Lab Results  Component Value Date   CHOL 124 06/08/2016   TRIG 183.0 (H) 06/08/2016   HDL 44.80 06/08/2016   CHOLHDL 3 06/08/2016   VLDL 36.6 06/08/2016   LDLCALC 43 06/08/2016     Current Medications: Current Outpatient Medications  Medication Sig Dispense Refill  . albuterol (VENTOLIN HFA) 108 (90 Base) MCG/ACT inhaler Inhale 2 puffs into the lungs every 6 (six) hours as needed. 1 Inhaler 5  . Cholecalciferol (VITAMIN D3) 50000 units CAPS Take 1 capsule by mouth once a week.  Same day 12 capsule 1  . clotrimazole (LOTRIMIN) 1 % cream Apply 1 application topically 2 (two) times daily. Bid to abdomen 7-10 days 60 g 0  . escitalopram (LEXAPRO) 10 MG  tablet Take 1 tablet (10 mg total) by mouth daily. 30 tablet 2  . fluticasone furoate-vilanterol (BREO ELLIPTA) 100-25 MCG/INH AEPB Inhale 1 puff into the lungs daily. Rinse mouth 3 each 3  . folic acid (FOLVITE) 1 MG tablet Take 1 mg by mouth daily.    Marland Kitchen gabapentin (NEURONTIN) 300 MG capsule TAKE 1 CAPSULE BY MOUTH FOUR TIMES DAILY 120 capsule 3  . lamoTRIgine (LAMICTAL) 100 MG tablet Take 1 tablet (100 mg total) by mouth 2 (two) times daily. 60 tablet 2  . leflunomide (ARAVA) 10 MG tablet Take 10 mg by mouth daily.   0  . LORazepam (ATIVAN) 0.5 MG tablet Take 1/2 pill at bed time 15 tablet 4  . lovastatin (MEVACOR) 20 MG tablet TAKE 1 TABLET BY MOUTH AT BEDTIME 90 tablet 3  . Melatonin 3 MG TABS Take 1 tablet (3 mg total) by mouth at bedtime. 30 tablet 2  . montelukast (SINGULAIR) 10 MG tablet Take 1 tablet (10 mg total) by mouth daily. 90 tablet 3  . multivitamin-lutein (OCUVITE-LUTEIN) CAPS capsule Take 1 capsule by mouth 2 (two) times daily.    . nebivolol (BYSTOLIC) 10 MG tablet Take 1 tablet (10 mg total) by mouth daily. 30 tablet 5  . olopatadine (PATANOL) 0.1 % ophthalmic solution Apply to eye.    . ondansetron (ZOFRAN) 4 MG tablet Take 1 tablet (4 mg total) by mouth every 8 (eight) hours as needed for nausea or vomiting. 30 tablet 0  . oxyCODONE (OXY IR/ROXICODONE) 5 MG immediate release tablet Take 1 tablet (5 mg total) by mouth every 6 (six) hours as needed for breakthrough pain. 5 tablet 0  . pantoprazole (PROTONIX) 40 MG tablet TAKE 1 TABLET BY MOUTH TWICE A DAY 60 tablet 2  . potassium chloride (K-DUR) 10 MEQ tablet take 1 tablet by mouth once daily 30 tablet 4  . QUEtiapine (SEROQUEL) 25 MG tablet Take 1 tablet (25 mg total) by mouth at bedtime. 30 tablet 2  . ranitidine (ZANTAC) 150 MG capsule Take 1 capsule (150 mg total) by mouth 2 (two) times daily. 180 capsule 3  . sucralfate (CARAFATE) 1 g tablet TAKE 1 TABLET BY MOUTH 4 TIMES A DAY WITH MEALS AND AT BEDTIME 120 tablet 2  .  Teriparatide, Recombinant, (FORTEO) 600 MCG/2.4ML SOLN Inject 0.08 mLs (20 mcg total) into the skin daily. 2.24 mL 0  . traMADol (ULTRAM) 50 MG tablet Take 1 tablet (50 mg total) by mouth every 12 (twelve) hours as needed. 60 tablet 2   No current facility-administered medications for this visit.     Neurologic: Headache: No Seizure: No Paresthesias:No  Musculoskeletal: Strength & Muscle Tone: within normal limits Gait & Station: normal Patient leans: N/A  Psychiatric Specialty Exam: ROS  Blood pressure (!) 136/105, pulse 72, temperature 98 F (36.7 C), temperature source Oral, weight 170 lb 6.4 oz (77.3 kg).Body mass index is 30.67 kg/m.  General Appearance: Casual  Eye Contact:  Fair  Speech:  Clear and Coherent  Volume:  Normal  Mood:  Anxious and Depressed  Affect:  Appropriate  Thought Process:  Coherent  Orientation:  Full (Time, Place, and Person)  Thought Content:  WDL  Suicidal Thoughts:  No  Homicidal Thoughts:  No  Memory:  Immediate;   Fair Recent;  Fair Remote;   Fair  Judgement:  Fair  Insight:  Fair  Psychomotor Activity:  Normal  Concentration:  Concentration: Fair and Attention Span: Fair  Recall:  [employer] of Knowledge:Fair  Language: Fair  Akathisia:  No  Handed:  Right  AIMS (if indicated):    Assets:  Communication Skills Desire for Improvement Physical Health  ADL's:  Intact  Cognition: WNL  Sleep:  poor    Treatment Plan Summary: Medication management   Discussed with patient about her medications. I will adjust her medications as follows.  Continue lamotrigine 100 mg by mouth twice a day Continue  Lexapro 10 mg every morning Continue  lorazepam 0.5 mg -Advised patient to take half  at night at bedtime and she demonstrated understanding. I will advise the pharmacy to split the pill into half for the patient and made a note on her prescription  Continue  Seroquel 25 mg by mouth daily at bedtime for mood symptoms and  depression. Melatonin 10 mg by mouth daily at bedtime when necessary and she agreed with the plan.   Discussed with her about the side effects in detail and she demonstrated understanding.  Follow-up in  3 months or earlier depending on her symptoms.   More than 50% of the time spent in psychoeducation, counseling and coordination of care.    This note was generated in part or whole with voice recognition software. Voice regonition is usually quite accurate but there are transcription errors that can and very often do occur. I apologize for any typographical errors that were not detected and corrected.    Rainey Pines, MD 7/8/201911:56 AM

## 2017-08-11 ENCOUNTER — Telehealth: Payer: Self-pay

## 2017-08-11 NOTE — Telephone Encounter (Signed)
Copied from [place] [id number]. Topic: General - Other >> Aug 11, 2017  1:50 PM Margot Ables wrote: Reason for CRM: calling for Kerri Carter. Pt is scheduled for Echo 7/17 at 10:00am. Please notify if this appt is not acceptable.

## 2017-08-17 ENCOUNTER — Other Ambulatory Visit: Payer: Self-pay | Admitting: Internal Medicine

## 2017-08-17 DIAGNOSIS — K219 Gastro-esophageal reflux disease without esophagitis: Secondary | ICD-10-CM

## 2017-08-17 MED ORDER — PANTOPRAZOLE SODIUM 40 MG PO TBEC: 40.0000 mg | DELAYED_RELEASE_TABLET | Freq: Every day | ORAL | 3 refills | Status: DC

## 2017-08-17 MED ORDER — PANTOPRAZOLE SODIUM 40 MG PO TBEC: DELAYED_RELEASE_TABLET | ORAL | 2 refills | Status: DC

## 2017-08-18 ENCOUNTER — Ambulatory Visit: Admission: RE | Admit: 2017-08-18 | Payer: Medicare Other | Source: Ambulatory Visit

## 2017-08-25 ENCOUNTER — Ambulatory Visit: Payer: Medicare Other | Admitting: Pulmonary Disease

## 2017-09-06 ENCOUNTER — Other Ambulatory Visit: Payer: Self-pay | Admitting: Internal Medicine

## 2017-09-06 MED ORDER — GABAPENTIN 300 MG PO CAPS: 300.0000 mg | ORAL_CAPSULE | Freq: Four times a day (QID) | ORAL | 11 refills | Status: DC

## 2017-09-14 ENCOUNTER — Other Ambulatory Visit: Payer: Self-pay | Admitting: Gastroenterology

## 2017-09-14 DIAGNOSIS — R1031 Right lower quadrant pain: Secondary | ICD-10-CM

## 2017-09-28 ENCOUNTER — Ambulatory Visit: Payer: Medicare Other | Admitting: Adult Health

## 2017-09-28 ENCOUNTER — Encounter: Payer: Self-pay | Admitting: Adult Health

## 2017-09-28 ENCOUNTER — Ambulatory Visit: Payer: Medicare Other | Admitting: Pulmonary Disease

## 2017-09-28 DIAGNOSIS — J441 Chronic obstructive pulmonary disease with (acute) exacerbation: Secondary | ICD-10-CM

## 2017-09-28 DIAGNOSIS — J3089 Other allergic rhinitis: Secondary | ICD-10-CM

## 2017-09-28 DIAGNOSIS — J309 Allergic rhinitis, unspecified: Secondary | ICD-10-CM | POA: Insufficient documentation

## 2017-09-28 MED ORDER — PREDNISONE 10 MG PO TABS: ORAL_TABLET | ORAL | 0 refills | Status: DC

## 2017-09-28 MED ORDER — FLUTICASONE FUROATE-VILANTEROL 200-25 MCG/INH IN AEPB: 1.0000 | INHALATION_SPRAY | Freq: Every day | RESPIRATORY_TRACT | 0 refills | Status: DC

## 2017-09-28 NOTE — Addendum Note (Signed)
Addended by: Parke Poisson E on: 09/28/2017 04:53 PM   Modules accepted: Orders

## 2017-09-28 NOTE — Patient Instructions (Signed)
Increase BREO 200 1 puff daily., take until sample is complete then return to BREO 100 1 puff daily.  Prednisone taper over next week.  Follow up with Dr. Lake Bells in 6 months and As needed   Please contact office for sooner follow up if symptoms do not improve or worsen or seek emergency care

## 2017-09-28 NOTE — Progress Notes (Signed)
@Patient  ID: Kerri Carter, female    DOB: 1940-05-08, 77 y.o.   MRN: [id number]  Chief Complaint  Patient presents with  . Follow-up    COPD    Referring provider: McLean-Scocuzza, Olivia Mackie *  HPI: 77 year old female former smoker followed for COPD RA on ARAVA (MTX was stopped 02/2016 )    TEST  04/2011 in March 2013 which showed: Ratio 70%, clear obstruction on flow volume loop, FEV1 1.79 L (81% predicted.), Total lung capacity 5.3 L (115% predicted), residual volume 2.49 L (136% predicted), DLCO 84% predicted PFT 11/2015 FEV1 94%, ratio 79, FVC 90%, DLCO 72% , ++BD response .   09/28/2017 Follow up : COPD  Patient presents for a six-month follow-up.  He has underlying asthma and COPD.  She remains on Breo and Singulair.  Complains over last month has had intermittent wheezing and tightness . Hot humid weather has been harder on her. No fever or discolored mucus .  Appetite is good w/ no n/v.d  Has albuterol neb/inhaler  at home but has not used it. We discussed using this for rescue use , pt education given .      Allergies  Allergen Reactions  . Ceftin [Cefuroxime Axetil] Anaphylaxis    Other reaction(s): Other (See Comments) REACTION: tongue and throat swell Other Reaction: TONGUE AND THROAT SWELLING REACTION: tongue and throat swell  . Lisinopril Anaphylaxis    REACTION: tongue and throat swelling (onset 10-10-09)  . Morphine Other (See Comments)    Per patient, low blood pressure issues, following morphine, that requires action to raise it back up. Drop in BP - can take small infrequent doses  . Sulfasalazine Anaphylaxis    Swelling tongue, lips, and jaws  . Aspirin Other (See Comments)    Sulfasalazine allergy cross reacts  . Antihistamines, Chlorpheniramine-Type     REACTION: makes pt hyper  . Antivert [Meclizine Hcl]     Other reaction(s): Unknown REACTION: bladder will not empty REACTION: bladder will not empty  . Decongestant [Pseudoephedrine Hcl]     Other  reaction(s): Unknown REACTION: makes pt hyper REACTION: makes pt hyper  . Doxycycline     REACTION: GI upset  . Morphine And Related Other (See Comments)    Per patient, low blood pressure issues, following morphine, that requires action to raise it back up.  . Polymyxin B     Medication was in eye drops.  . Pseudoephedrine Hcl Other (See Comments)    REACTION: makes pt hyper  . Sulfa Antibiotics     Face swelling  . Xarelto [Rivaroxaban]     Other reaction(s): Other (See Comments) GI bleed Stomach burning, bleeding, and tar in stool GI bleed  . Adhesive [Tape] Rash  . Iodine Hives and Rash  . Levaquin [Levofloxacin In D5w] Rash  . Tetanus Toxoid Adsorbed Other (See Comments)    REACTION: rash, fever, hot to touch at injection site  . Tetanus Toxoids     REACTION: rash, fever, hot to touch at injection site    Immunization History  Administered Date(s) Administered  . Influenza Split 10/04/2012, 10/25/2013, 10/28/2016  . Influenza Whole 11/03/2011  . Influenza, High Dose Seasonal PF 10/04/2015, 11/03/2016  . Influenza,inj,Quad PF,6+ Mos 10/31/2014  . Influenza-Unspecified 01/02/2010, 11/24/2011, 10/21/2013  . Pneumococcal Conjugate-13 10/21/2013  . Pneumococcal Polysaccharide-23 11/07/2015    Past Medical History:  Diagnosis Date  . Anxiety   . Asthma   . Cataract    s/p b/l repair   . Chicken pox   .  CKD (chronic kidney disease)   . CKD (chronic kidney disease), stage III ([place])    a. s/p R nephrectomy.  . Conversion disorder   . COPD (chronic obstructive pulmonary disease) ([place])   . Depression   . Essential hypertension   . GERD (gastroesophageal reflux disease)   . Hyperlipidemia   . Inflammatory arthritis    a. hands/carpal tunnel.  b. Low titer rheumatoid factor. c. Negative anti-CCP antibodies. d. Plaquenil.  . Non-Obstructive CAD    a. 07/2009 Cath ([facility]): nonobs dzs;  b. 03/2011 Cath [facility]): nonobs dzs.  . Osteoarthritis    a. Knees.  . PUD (peptic  ulcer disease)   . S/P right hip fracture    11/01/16 s/p repair  . Spinal stenosis at L4-L5 level    severe with L4/L5 anterolisthesis grade 1 anterolisthesis   . Toxic maculopathy   . Valvular heart disease    a. 07/2015 Echo: EF 55-60%, Mild AI, AS, MR, and TR.    Tobacco History: Social History   Tobacco Use  Smoking Status Former Smoker  . Packs/day: 0.50  . Years: 20.00  . Pack years: 10.00  . Types: Cigarettes  . Last attempt to quit: 02/02/1974  . Years since quitting: 43.6  Smokeless Tobacco Never Used   Counseling given: Not Answered   Outpatient Medications Prior to Visit  Medication Sig Dispense Refill  . albuterol (VENTOLIN HFA) 108 (90 Base) MCG/ACT inhaler Inhale 2 puffs into the lungs every 6 (six) hours as needed. 1 Inhaler 5  . Cholecalciferol (VITAMIN D3) 50000 units CAPS Take 1 capsule by mouth once a week. Same day 12 capsule 1  . clotrimazole (LOTRIMIN) 1 % cream Apply 1 application topically 2 (two) times daily. Bid to abdomen 7-10 days 60 g 0  . escitalopram (LEXAPRO) 10 MG tablet Take 1 tablet (10 mg total) by mouth daily. 30 tablet 3  . fluticasone furoate-vilanterol (BREO ELLIPTA) 100-25 MCG/INH AEPB Inhale 1 puff into the lungs daily. Rinse mouth 3 each 3  . folic acid (FOLVITE) 1 MG tablet Take 1 mg by mouth daily.    Marland Kitchen gabapentin (NEURONTIN) 300 MG capsule Take 1 capsule (300 mg total) by mouth 4 (four) times daily. 120 capsule 11  . lamoTRIgine (LAMICTAL) 100 MG tablet Take 1 tablet (100 mg total) by mouth 2 (two) times daily. 60 tablet 3  . leflunomide (ARAVA) 10 MG tablet Take 10 mg by mouth daily.   0  . LORazepam (ATIVAN) 0.5 MG tablet Take 1/2 pill at bed time 15 tablet 5  . lovastatin (MEVACOR) 20 MG tablet TAKE 1 TABLET BY MOUTH AT BEDTIME 90 tablet 3  . Melatonin 10 MG TABS Take 10 mg by mouth at bedtime. 30 tablet 3  . montelukast (SINGULAIR) 10 MG tablet Take 1 tablet (10 mg total) by mouth daily. 90 tablet 3  . multivitamin-lutein  (OCUVITE-LUTEIN) CAPS capsule Take 1 capsule by mouth 2 (two) times daily.    . nebivolol (BYSTOLIC) 10 MG tablet Take 1 tablet (10 mg total) by mouth daily. 30 tablet 5  . ondansetron (ZOFRAN) 4 MG tablet Take 1 tablet (4 mg total) by mouth every 8 (eight) hours as needed for nausea or vomiting. 30 tablet 0  . oxyCODONE (OXY IR/ROXICODONE) 5 MG immediate release tablet Take 1 tablet (5 mg total) by mouth every 6 (six) hours as needed for breakthrough pain. 5 tablet 0  . pantoprazole (PROTONIX) 40 MG tablet Take 1 tablet (40 mg total) by mouth daily.  30 minutes before food. Note reduction in frequency 90 tablet 3  . QUEtiapine (SEROQUEL) 25 MG tablet Take 1 tablet (25 mg total) by mouth at bedtime. 30 tablet 3  . ranitidine (ZANTAC) 150 MG capsule Take 1 capsule (150 mg total) by mouth 2 (two) times daily. 180 capsule 3  . sucralfate (CARAFATE) 1 g tablet TAKE 1 TABLET BY MOUTH 4 TIMES A DAY WITH MEALS AND AT BEDTIME 120 tablet 2  . Teriparatide, Recombinant, (FORTEO) 600 MCG/2.4ML SOLN Inject 0.08 mLs (20 mcg total) into the skin daily. 2.24 mL 0  . traMADol (ULTRAM) 50 MG tablet Take 1 tablet (50 mg total) by mouth every 12 (twelve) hours as needed. 60 tablet 2  . potassium chloride (K-DUR) 10 MEQ tablet take 1 tablet by mouth once daily (Patient not taking: Reported on 09/28/2017) 30 tablet 4   No facility-administered medications prior to visit.      Review of Systems  Constitutional:   No  weight loss, night sweats,  Fevers, chills, fatigue, or  lassitude.  HEENT:   No headaches,  Difficulty swallowing,  Tooth/dental problems, or  Sore throat,                No sneezing, itching, ear ache,  +nasal congestion, post nasal drip,   CV:  No chest pain,  Orthopnea, PND, swelling in lower extremities, anasarca, dizziness, palpitations, syncope.   GI  No heartburn, indigestion, abdominal pain, nausea, vomiting, diarrhea, change in bowel habits, loss of appetite, bloody stools.   Resp: .  No  chest wall deformity  Skin: no rash or lesions.  GU: no dysuria, change in color of urine, no urgency or frequency.  No flank pain, no hematuria   MS:  No joint pain or swelling.  No decreased range of motion.  No back pain.    Physical Exam  BP (!) 110/58 (BP Location: Left Arm, Cuff Size: Normal)   Pulse 70   Ht 5' 3.5" (1.613 m)   Wt 174 lb 9.6 oz (79.2 kg)   SpO2 93%   BMI 30.44 kg/m   GEN: A/Ox3; pleasant , NAD,   HEENT:  [place]/AT,  EACs-clear, TMs-wnl, NOSE-clear, THROAT-clear, no lesions, no postnasal drip or exudate noted.   NECK:  Supple w/ fair ROM; no JVD; normal carotid impulses w/o bruits; no thyromegaly or nodules palpated; no lymphadenopathy.    RESP  Faint exp wheezing . Talks in full sentences  no accessory muscle use, no dullness to percussion  CARD:  RRR, no m/r/g, tr to min  peripheral edema, pulses intact, no cyanosis or clubbing.  GI:   Soft & nt; nml bowel sounds; no organomegaly or masses detected.   Musco: Warm bil, no deformities or joint swelling noted.   Neuro: alert, no focal deficits noted.    Skin: Warm, no lesions or rashes    Lab Results:  CBC   BNP No results found for: BNP  ProBNP No results found for: PROBNP  Imaging: No results found.   Assessment & Plan:   COPD (chronic obstructive pulmonary disease) (HCC) Mild Flare -hold on abx at this time   Plan  Patient Instructions  Increase BREO 200 1 puff daily., take until sample is complete then return to BREO 100 1 puff daily.  Prednisone taper over next week.  Follow up with Dr. Lake Bells in 6 months and As needed   Please contact office for sooner follow up if symptoms do not improve or worsen or seek emergency  care       Allergic rhinitis Flare   Plan  Patient Instructions  Increase BREO 200 1 puff daily., take until sample is complete then return to BREO 100 1 puff daily.  Prednisone taper over next week.  Follow up with Dr. Lake Bells in 6 months and As needed     Please contact office for sooner follow up if symptoms do not improve or worsen or seek emergency care    n      Rexene Edison, NP 09/28/2017

## 2017-09-28 NOTE — Assessment & Plan Note (Signed)
Flare   Plan  Patient Instructions  Increase BREO 200 1 puff daily., take until sample is complete then return to BREO 100 1 puff daily.  Prednisone taper over next week.  Follow up with Dr. Lake Bells in 6 months and As needed   Please contact office for sooner follow up if symptoms do not improve or worsen or seek emergency care    n

## 2017-09-28 NOTE — Assessment & Plan Note (Addendum)
Mild Flare -hold on abx at this time   Plan  Patient Instructions  Increase BREO 200 1 puff daily., take until sample is complete then return to BREO 100 1 puff daily.  Prednisone taper over next week.  Follow up with Dr. Lake Bells in 6 months and As needed   Please contact office for sooner follow up if symptoms do not improve or worsen or seek emergency care

## 2017-09-30 ENCOUNTER — Telehealth: Payer: Self-pay | Admitting: Internal Medicine

## 2017-09-30 ENCOUNTER — Other Ambulatory Visit: Payer: Self-pay | Admitting: Internal Medicine

## 2017-09-30 DIAGNOSIS — G8929 Other chronic pain: Secondary | ICD-10-CM

## 2017-09-30 DIAGNOSIS — R1031 Right lower quadrant pain: Principal | ICD-10-CM

## 2017-09-30 MED ORDER — LEFLUNOMIDE 10 MG PO TABS: 20.0000 mg | ORAL_TABLET | Freq: Every day | ORAL | 0 refills | Status: DC

## 2017-09-30 MED ORDER — LEFLUNOMIDE 20 MG PO TABS: 20.0000 mg | ORAL_TABLET | Freq: Every day | ORAL | Status: DC

## 2017-09-30 NOTE — Telephone Encounter (Signed)
Please advise 

## 2017-09-30 NOTE — Progress Notes (Signed)
Reviewed, agree 

## 2017-09-30 NOTE — Telephone Encounter (Signed)
Copied from [place] [id number]. Topic: General - Other >> Sep 30, 2017  2:20 PM Lennox Solders wrote: Reason for CRM: [employer] is calling and will fax over a form for pt to start chole medication if appropriate

## 2017-10-05 ENCOUNTER — Ambulatory Visit
Admission: RE | Admit: 2017-10-05 | Discharge: 2017-10-05 | Disposition: A | Discharge status: Home / Self Care | Payer: Medicare Other | Source: Ambulatory Visit | Attending: Urology | Admitting: Urology

## 2017-10-05 DIAGNOSIS — Z905 Acquired absence of kidney: Secondary | ICD-10-CM | POA: Insufficient documentation

## 2017-10-05 DIAGNOSIS — N83202 Unspecified ovarian cyst, left side: Secondary | ICD-10-CM | POA: Insufficient documentation

## 2017-10-05 DIAGNOSIS — N2889 Other specified disorders of kidney and ureter: Secondary | ICD-10-CM

## 2017-10-12 ENCOUNTER — Ambulatory Visit: Payer: Medicare Other | Admitting: Urology

## 2017-10-12 ENCOUNTER — Encounter: Payer: Self-pay | Admitting: Urology

## 2017-10-12 VITALS — BP 103/59 | HR 75

## 2017-10-12 DIAGNOSIS — R350 Frequency of micturition: Secondary | ICD-10-CM

## 2017-10-12 DIAGNOSIS — N3941 Urge incontinence: Secondary | ICD-10-CM

## 2017-10-12 DIAGNOSIS — Z87898 Personal history of other specified conditions: Secondary | ICD-10-CM

## 2017-10-12 DIAGNOSIS — N2889 Other specified disorders of kidney and ureter: Secondary | ICD-10-CM

## 2017-10-12 DIAGNOSIS — R682 Dry mouth, unspecified: Secondary | ICD-10-CM

## 2017-10-12 LAB — BLADDER SCAN AMB NON-IMAGING

## 2017-10-12 NOTE — Progress Notes (Signed)
10/12/2017 5:10 PM   Kerri Carter Kerri Carter 04/15/1940 [id number]  Referring provider: Coral Spikes, DO [address] [place], [place] [address]  Chief Complaint  Patient presents with  . renal mass    1year    HPI: 77 year old female with a history of solitary kidney with a possible left renal mass and OAB who returns today for routine annual follow-up.  She was initially found to have incidental finding on CT scan abd/ pelvis with contrast performed on 03/19/16 performed for the purpose of evaluation of abdominal pain.  Per the report, there are multiple too small to characterize hypodense lesions within the left kidney which are subcentimeter as well as a subcentimeter lesion in the left kidney (lower pole) which supposedly is heterogeneous and suspicious for neoplasm.  Her right kidney is surgically absent status post nephrectomy for presumed benign causes.  She returns today with follow-up renal ultrasound.  No suspicious renal lesions are identified, she has a 1 cm left renal cyst otherwise no solid lesions.  She denies any flank pain or gross hematuria.  This is unchanged from last visit.  No weight loss.  She does complain of ongoing urinary urgency, and episodes of urge incontinence.  She also now gets up twice at night to void which is relatively new for her.  She previously tried Myrbetriq 25 mg without much results.  She has chronic dry mouth.  She sips on ice tea all day long and during the night.   PMH: Past Medical History:  Diagnosis Date  . Anxiety   . Asthma   . Cataract    s/p b/l repair   . Chicken pox   . CKD (chronic kidney disease)   . CKD (chronic kidney disease), stage III ([place])    a. s/p R nephrectomy.  . Conversion disorder   . COPD (chronic obstructive pulmonary disease) ([place])   . Depression   . Essential hypertension   . GERD (gastroesophageal reflux disease)   . Hyperlipidemia   . Inflammatory arthritis    a. hands/carpal tunnel.  b.  Low titer rheumatoid factor. c. Negative anti-CCP antibodies. d. Plaquenil.  . Non-Obstructive CAD    a. 07/2009 Cath ([facility]): nonobs dzs;  b. 03/2011 Cath [facility]): nonobs dzs.  . Osteoarthritis    a. Knees.  . PUD (peptic ulcer disease)   . S/P right hip fracture    11/01/16 s/p repair  . Spinal stenosis at L4-L5 level    severe with L4/L5 anterolisthesis grade 1 anterolisthesis   . Toxic maculopathy   . Valvular heart disease    a. 07/2015 Echo: EF 55-60%, Mild AI, AS, MR, and TR.    Surgical History: Past Surgical History:  Procedure Laterality Date  . APPENDECTOMY    . BACK SURGERY    . BUNIONECTOMY    . CATARACT EXTRACTION, BILATERAL    . CESAREAN SECTION     x1  . CHOLECYSTECTOMY N/A 05/11/2016   Procedure: LAPAROSCOPIC CHOLECYSTECTOMY;  Surgeon: Florene Glen, MD;  Location: [facility] ORS;  Service: General;  Laterality: N/A;  . COLONOSCOPY WITH PROPOFOL N/A 04/02/2016   Procedure: COLONOSCOPY WITH PROPOFOL;  Surgeon: Jonathon Bellows, MD;  Location: [facility] ENDOSCOPY;  Service: Endoscopy;  Laterality: N/A;  . ENDOSCOPIC RETROGRADE CHOLANGIOPANCREATOGRAPHY (ERCP) WITH PROPOFOL N/A 05/08/2016   Procedure: ENDOSCOPIC RETROGRADE CHOLANGIOPANCREATOGRAPHY (ERCP) WITH PROPOFOL;  Surgeon: Lucilla Lame, MD;  Location: [facility] ENDOSCOPY;  Service: Endoscopy;  Laterality: N/A;  . ERCP     with biliary spincterotomy 05/08/16  Dr. Allen Norris for choledocholithiasis   . ESOPHAGEAL DILATION  04/02/2016   Procedure: ESOPHAGEAL DILATION;  Surgeon: Jonathon Bellows, MD;  Location: [facility] ENDOSCOPY;  Service: Endoscopy;;  . ESOPHAGOGASTRODUODENOSCOPY (EGD) WITH PROPOFOL N/A 04/02/2016   Procedure: ESOPHAGOGASTRODUODENOSCOPY (EGD) WITH PROPOFOL;  Surgeon: Jonathon Bellows, MD;  Location: [facility] ENDOSCOPY;  Service: Endoscopy;  Laterality: N/A;  . HIP ARTHROPLASTY Right 11/01/2016   Procedure: ARTHROPLASTY BIPOLAR HIP (HEMIARTHROPLASTY);  Surgeon: Corky Mull, MD;  Location: [facility] ORS;  Service: Orthopedics;  Laterality: Right;  . 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 osteoporosis     noted DEXA 08/19/16   . REPLACEMENT TOTAL KNEE Right   . TONSILLECTOMY    . TOTAL HIP ARTHROPLASTY  12/10/11   [facility] left hip  . TOTAL HIP ARTHROPLASTY Bilateral   . TUBAL LIGATION      Home Medications:  Allergies as of 10/12/2017      Reactions   Ceftin [cefuroxime Axetil] Anaphylaxis   Other reaction(s): Other (See Comments) REACTION: tongue and throat swell Other Reaction: TONGUE AND THROAT SWELLING REACTION: tongue and throat swell   Lisinopril Anaphylaxis   REACTION: tongue and throat swelling (onset 10-10-09)   Morphine Other (See Comments)   Per patient, low blood pressure issues, following morphine, that requires action to raise it back up. Drop in BP - can take small infrequent doses   Sulfasalazine Anaphylaxis   Swelling tongue, lips, and jaws   Aspirin Other (See Comments)   Sulfasalazine allergy cross reacts   Antihistamines, Chlorpheniramine-type    REACTION: makes pt hyper   Antivert [meclizine Hcl]    Other reaction(s): Unknown REACTION: bladder will not empty REACTION: bladder will not empty   Decongestant [pseudoephedrine Hcl]    Other reaction(s): Unknown REACTION: makes pt hyper REACTION: makes pt hyper   Doxycycline    REACTION: GI upset   Morphine And Related Other (See Comments)   Per patient, low blood pressure issues, following morphine, that requires action to raise it back up.   Polymyxin B    Medication was in eye drops.   Pseudoephedrine Hcl Other (See Comments)   REACTION: makes pt hyper   Sulfa Antibiotics    Face swelling   Xarelto [rivaroxaban]    Other reaction(s): Other (See Comments) GI bleed Stomach burning, bleeding, and tar in stool GI bleed   Adhesive [tape] Rash   Iodine Hives, Rash   Levaquin [levofloxacin In D5w] Rash   Tetanus Toxoid Adsorbed Other (See Comments)   REACTION: rash, fever, hot to touch at injection site   Tetanus  Toxoids    REACTION: rash, fever, hot to touch at injection site      Medication List        Accurate as of 10/12/17  5:10 PM. Always use your most recent med list.          albuterol 108 (90 Base) MCG/ACT inhaler Commonly known as:  PROVENTIL HFA;VENTOLIN HFA Inhale 2 puffs into the lungs every 6 (six) hours as needed.   clotrimazole 1 % cream Commonly known as:  LOTRIMIN Apply 1 application topically 2 (two) times daily. Bid to abdomen 7-10 days   escitalopram 10 MG tablet Commonly known as:  LEXAPRO Take 1 tablet (10 mg total) by mouth daily.   fluticasone furoate-vilanterol 100-25 MCG/INH Aepb Commonly known as:  BREO ELLIPTA Inhale 1 puff into the lungs daily. Rinse mouth   fluticasone furoate-vilanterol 200-25 MCG/INH Aepb Commonly known  as:  BREO ELLIPTA Inhale 1 puff into the lungs daily.   folic acid 1 MG tablet Commonly known as:  FOLVITE Take 1 mg by mouth daily.   gabapentin 300 MG capsule Commonly known as:  NEURONTIN Take 1 capsule (300 mg total) by mouth 4 (four) times daily.   lamoTRIgine 100 MG tablet Commonly known as:  LAMICTAL Take 1 tablet (100 mg total) by mouth 2 (two) times daily.   leflunomide 20 MG tablet Commonly known as:  ARAVA Take 1 tablet (20 mg total) by mouth daily.   LORazepam 0.5 MG tablet Commonly known as:  ATIVAN Take 1/2 pill at bed time   lovastatin 20 MG tablet Commonly known as:  MEVACOR TAKE 1 TABLET BY MOUTH AT BEDTIME   Melatonin 10 MG Tabs Take 10 mg by mouth at bedtime.   montelukast 10 MG tablet Commonly known as:  SINGULAIR Take 1 tablet (10 mg total) by mouth daily.   multivitamin-lutein Caps capsule Take 1 capsule by mouth 2 (two) times daily.   nebivolol 10 MG tablet Commonly known as:  BYSTOLIC Take 1 tablet (10 mg total) by mouth daily.   ondansetron 4 MG tablet Commonly known as:  ZOFRAN Take 1 tablet (4 mg total) by mouth every 8 (eight) hours as needed for nausea or vomiting.   oxyCODONE  5 MG immediate release tablet Commonly known as:  Oxy IR/ROXICODONE Take 1 tablet (5 mg total) by mouth every 6 (six) hours as needed for breakthrough pain.   pantoprazole 40 MG tablet Commonly known as:  PROTONIX Take 1 tablet (40 mg total) by mouth daily. 30 minutes before food. Note reduction in frequency   potassium chloride 10 MEQ tablet Commonly known as:  K-DUR take 1 tablet by mouth once daily   predniSONE 10 MG tablet Commonly known as:  DELTASONE 4 tabs for 2 days, then 3 tabs for 2 days, 2 tabs for 2 days, then 1 tab for 2 days, then stop   QUEtiapine 25 MG tablet Commonly known as:  SEROQUEL Take 1 tablet (25 mg total) by mouth at bedtime.   ranitidine 150 MG capsule Commonly known as:  ZANTAC Take 1 capsule (150 mg total) by mouth 2 (two) times daily.   sucralfate 1 g tablet Commonly known as:  CARAFATE TAKE 1 TABLET BY MOUTH 4 TIMES A DAY WITH MEALS AND AT BEDTIME   Teriparatide (Recombinant) 600 MCG/2.4ML Soln Inject 0.08 mLs (20 mcg total) into the skin daily.   traMADol 50 MG tablet Commonly known as:  ULTRAM Take 1 tablet (50 mg total) by mouth every 12 (twelve) hours as needed.   Vitamin D3 50000 units Caps Take 1 capsule by mouth once a week. Same day       Allergies:  Allergies  Allergen Reactions  . Ceftin [Cefuroxime Axetil] Anaphylaxis    Other reaction(s): Other (See Comments) REACTION: tongue and throat swell Other Reaction: TONGUE AND THROAT SWELLING REACTION: tongue and throat swell  . Lisinopril Anaphylaxis    REACTION: tongue and throat swelling (onset 10-10-09)  . Morphine Other (See Comments)    Per patient, low blood pressure issues, following morphine, that requires action to raise it back up. Drop in BP - can take small infrequent doses  . Sulfasalazine Anaphylaxis    Swelling tongue, lips, and jaws  . Aspirin Other (See Comments)    Sulfasalazine allergy cross reacts  . Antihistamines, Chlorpheniramine-Type     REACTION:  makes pt hyper  . Antivert [Meclizine  Hcl]     Other reaction(s): Unknown REACTION: bladder will not empty REACTION: bladder will not empty  . Decongestant [Pseudoephedrine Hcl]     Other reaction(s): Unknown REACTION: makes pt hyper REACTION: makes pt hyper  . Doxycycline     REACTION: GI upset  . Morphine And Related Other (See Comments)    Per patient, low blood pressure issues, following morphine, that requires action to raise it back up.  . Polymyxin B     Medication was in eye drops.  . Pseudoephedrine Hcl Other (See Comments)    REACTION: makes pt hyper  . Sulfa Antibiotics     Face swelling  . Xarelto [Rivaroxaban]     Other reaction(s): Other (See Comments) GI bleed Stomach burning, bleeding, and tar in stool GI bleed  . Adhesive [Tape] Rash  . Iodine Hives and Rash  . Levaquin [Levofloxacin In D5w] Rash  . Tetanus Toxoid Adsorbed Other (See Comments)    REACTION: rash, fever, hot to touch at injection site  . Tetanus Toxoids     REACTION: rash, fever, hot to touch at injection site    Family History: Family History  Problem Relation Age of Onset  . Rheum arthritis Mother   . Asthma Mother   . Parkinson's disease Mother   . Heart disease Mother   . Stroke Mother   . Hypertension Mother   . Heart attack Father   . Heart disease Father   . Hypertension Father   . Diabetes Son   . Gout Son   . Asthma Sister   . Heart disease Sister   . Lung cancer Sister   . Heart disease Sister   . Heart disease Sister   . Breast cancer Sister   . Heart attack Sister   . Heart disease Brother   . Heart disease Maternal Grandmother   . Diabetes Maternal Grandmother   . Colon cancer Maternal Grandmother   . Cancer Maternal Grandmother        Hodgkins lymphoma  . Heart disease Brother   . Alcohol abuse Brother   . Depression Brother   . Dementia Son     Social History:  reports that she quit smoking about 43 years ago. Her smoking use included cigarettes. She has  a 10.00 pack-year smoking history. She has never used smokeless tobacco. She reports that she does not drink alcohol or use drugs.  ROS: UROLOGY Frequent Urination?: Yes Hard to postpone urination?: No Burning/pain with urination?: No Get up at night to urinate?: Yes Leakage of urine?: Yes Urine stream starts and stops?: No Trouble starting stream?: No Do you have to strain to urinate?: No Blood in urine?: No Urinary tract infection?: No Sexually transmitted disease?: No Injury to kidneys or bladder?: No Painful intercourse?: No Weak stream?: No Currently pregnant?: No Vaginal bleeding?: No Last menstrual period?: n  Gastrointestinal Nausea?: No Vomiting?: No Indigestion/heartburn?: Yes Diarrhea?: No Constipation?: No  Constitutional Fever: No Night sweats?: Yes Weight loss?: No Fatigue?: Yes  Skin Skin rash/lesions?: No Itching?: No  Eyes Blurred vision?: No Double vision?: No  Ears/Nose/Throat Sore throat?: No Sinus problems?: No  Hematologic/Lymphatic Swollen glands?: No Easy bruising?: Yes  Cardiovascular Leg swelling?: Yes Chest pain?: No  Respiratory Cough?: No Shortness of breath?: Yes  Endocrine Excessive thirst?: Yes  Musculoskeletal Back pain?: No Joint pain?: Yes  Neurological Headaches?: No Dizziness?: No  Psychologic Depression?: Yes Anxiety?: Yes  Physical Exam: BP (!) 103/59   Pulse 75   Constitutional:  Alert and oriented, No acute distress. HEENT: [place] AT, moist mucus membranes.  Trachea midline, no masses. Cardiovascular: No clubbing, cyanosis, or edema. Respiratory: Normal respiratory effort, no increased work of breathing. Skin: No rashes, bruises or suspicious lesions. Neurologic: Grossly intact, no focal deficits, moving all 4 extremities. Psychiatric: Normal mood and affect.  Laboratory Data: Lab Results  Component Value Date   WBC 11.4 (H) 08/06/2017   HGB 13.8 08/06/2017   HCT 41.9 08/06/2017   MCV 86.4  08/06/2017   PLT 224 08/06/2017    Lab Results  Component Value Date   CREATININE 1.27 (H) 08/06/2017    Lab Results  Component Value Date   HGBA1C 5.6 08/06/2017    Urinalysis UA reviewed, unremarkable.  See epic.  Pertinent Imaging: Results for orders placed during the hospital encounter of 10/05/17  US RENAL   Narrative CLINICAL DATA:  Left renal mass.  EXAM: RENAL / URINARY TRACT ULTRASOUND COMPLETE  COMPARISON:  Renal ultrasound dated October 07, 2016. CT abdomen pelvis dated Jun 12, 2016.  FINDINGS: Right Kidney:  Surgically absent.  Left Kidney:  Length: 10.5 cm. Echogenicity within normal limits. No mass or hydronephrosis visualized. Small 1.0 cm cyst in the lower pole.  Bladder:  Appears normal for degree of bladder distention.  Incidental note is made of a 2.2 cm simple cyst in the left ovary, unchanged when compared to prior CT from May 2018.  IMPRESSION: 1. No suspicious left renal mass.  Small cyst in the lower pole. 2. Prior right nephrectomy. 3. Stable 2.2 cm simple cyst in the left ovary. This is almost certainly benign, but follow up ultrasound is recommended yearly according to the Society of Radiologists in [place] Statement (D Clovis Riley et al. Management of Asymptomatic Ovarian and Other Adnexal Cysts Imaged at [place]: Society of Radiologists in [place] Statement 2010. Radiology 256 (Sept 2010): 810-175.).   Electronically Signed   By: Titus Dubin M.D.   On: 10/06/2017 09:04    Renal ultrasound was personally reviewed today, no suspicious renal masses, small left lower pole cyst appreciated.   Results for orders placed or performed in visit on 10/12/17  BLADDER SCAN AMB NON-IMAGING  Result Value Ref Range   Scan Result 38ml      Assessment & Plan:    1. Left renal mass Previous CT scan 2018 with possibly suspicious lesion in the left kidney which was incompletely  characterized Renal ultrasound today is reassuring Given she has a solitary kidney, will repeated again in 1 year to ensure that there is no changes She is agreeable this plan Creatinine is stable  2. Urinary frequency Primarily today discussed behavioral modification, avoidance of beverages just before bed for nocturia x2 Given her age and comorbidities, his encounters is a relatively contraindicated especially given her dry mouth as well She is given samples of Myrbetriq 50 mg x 4 weeks today to try higher dose see if this helps with her urinary frequency and urge incontinence-she will let us know if she would like to fill this prescription - Urinalysis, Complete - BLADDER SCAN AMB NON-IMAGING  3. Urge incontinence As above  4. History of urinary retention Unclear etiology, adequate bladder emptying today  5. Dry mouth Recommended trial of Biotene for oral lubrication rather than drinking fluids throughout the night   Return in about 1 year (around 10/13/2018) for RUS/ PVR.  Hollice Espy, MD  [facility] [address], [place], [place] [address] [phone]

## 2017-10-13 LAB — URINALYSIS, COMPLETE
Bilirubin, UA: NEGATIVE
Glucose, UA: NEGATIVE
Ketones, UA: NEGATIVE
Leukocytes, UA: NEGATIVE
Nitrite, UA: NEGATIVE
Protein, UA: NEGATIVE
RBC, UA: NEGATIVE
Specific Gravity, UA: 1.015 (ref 1.005–1.030)
Urobilinogen, Ur: 0.2 mg/dL (ref 0.2–1.0)
pH, UA: 6.5 (ref 5.0–7.5)

## 2017-10-13 LAB — MICROSCOPIC EXAMINATION: WBC, UA: NONE SEEN /hpf (ref 0–5)

## 2017-10-19 ENCOUNTER — Encounter: Payer: Self-pay | Admitting: Internal Medicine

## 2017-10-19 ENCOUNTER — Ambulatory Visit: Payer: Medicare Other | Admitting: Internal Medicine

## 2017-10-19 VITALS — BP 136/70 | HR 71 | Temp 97.9°F | Ht 63.5 in | Wt 173.8 lb

## 2017-10-19 DIAGNOSIS — Z01818 Encounter for other preprocedural examination: Secondary | ICD-10-CM

## 2017-10-19 DIAGNOSIS — T148XXA Other injury of unspecified body region, initial encounter: Secondary | ICD-10-CM

## 2017-10-19 DIAGNOSIS — D72829 Elevated white blood cell count, unspecified: Secondary | ICD-10-CM | POA: Diagnosis not present

## 2017-10-19 DIAGNOSIS — R7989 Other specified abnormal findings of blood chemistry: Secondary | ICD-10-CM | POA: Diagnosis not present

## 2017-10-19 DIAGNOSIS — Z0184 Encounter for antibody response examination: Secondary | ICD-10-CM

## 2017-10-19 DIAGNOSIS — Z1231 Encounter for screening mammogram for malignant neoplasm of breast: Secondary | ICD-10-CM

## 2017-10-19 DIAGNOSIS — E785 Hyperlipidemia, unspecified: Secondary | ICD-10-CM

## 2017-10-19 DIAGNOSIS — I38 Endocarditis, valve unspecified: Secondary | ICD-10-CM

## 2017-10-19 DIAGNOSIS — R61 Generalized hyperhidrosis: Secondary | ICD-10-CM

## 2017-10-19 DIAGNOSIS — Z1159 Encounter for screening for other viral diseases: Secondary | ICD-10-CM

## 2017-10-19 HISTORY — DX: Elevated white blood cell count, unspecified: D72.829

## 2017-10-19 MED ORDER — MUPIROCIN 2 % EX OINT: 1.0000 "application " | TOPICAL_OINTMENT | Freq: Two times a day (BID) | CUTANEOUS | 0 refills | Status: DC

## 2017-10-19 NOTE — Patient Instructions (Addendum)
Fish oil 2000 mg up to 2x per day   Cetaphil or Cerave Cream   bactroban to wounds 2x per day x 1 week   CEA testing, CA 125, CA 25.27, CA 19-9 tumor markers screening for cancer ask insurance how much they cost  Consider hematology/oncology consult if your white blood cell count is elevated      Results for Kerri Carter, Kerri Carter (MRN [id number]) as of 10/19/2017 15:03  Ref. Range 06/08/2016 14:27 08/06/2017 09:10  Cholesterol Latest Ref Range: <200 mg/dL 124 162  HDL Cholesterol Latest Ref Range: >50 mg/dL 44.80 42 (L)  LDL (calc) Latest Ref Range: 0 - 99 mg/dL 43   LDL Cholesterol (Calc) Latest Units: mg/dL (calc)  86  NonHDL Unknown 79.57   Non-HDL Cholesterol (Calc) Latest Ref Range: <130 mg/dL (calc)  120  Triglycerides Latest Ref Range: <150 mg/dL 183.0 (H) 262 (H)   Cholesterol Cholesterol is a white, waxy, fat-like substance that is needed by the human body in small amounts. The liver makes all the cholesterol we need. Cholesterol is carried from the liver by the blood through the blood vessels. Deposits of cholesterol (plaques) may build up on blood vessel (artery) walls. Plaques make the arteries narrower and stiffer. Cholesterol plaques increase the risk for heart attack and stroke. You cannot feel your cholesterol level even if it is very high. The only way to know that it is high is to have a blood test. Once you know your cholesterol levels, you should keep a record of the test results. Work with your health care provider to keep your levels in the desired range. What do the results mean?  Total cholesterol is a rough measure of all the cholesterol in your blood.  LDL (low-density lipoprotein) is the "bad" cholesterol. This is the type that causes plaque to build up on the artery walls. You want this level to be low.  HDL (high-density lipoprotein) is the "good" cholesterol because it cleans the arteries and carries the LDL away. You want this level to be high.  Triglycerides are  fat that the body can either burn for energy or store. High levels are closely linked to heart disease. What are the desired levels of cholesterol?  Total cholesterol below 200.  LDL below 100 for people who are at risk, below 70 for people at very high risk.  HDL above 40 is good. A level of 60 or higher is considered to be protective against heart disease.  Triglycerides below 150. How can I lower my cholesterol? Diet Follow your diet program as told by your health care provider.  Choose fish or white meat chicken and [place], roasted or baked. Limit fatty cuts of red meat, fried foods, and processed meats, such as sausage and lunch meats.  Eat lots of fresh fruits and vegetables.  Choose whole grains, beans, pasta, potatoes, and cereals.  Choose olive oil, corn oil, or canola oil, and use only small amounts.  Avoid butter, mayonnaise, shortening, or palm kernel oils.  Avoid foods with trans fats.  Drink skim or nonfat milk and eat low-fat or nonfat yogurt and cheeses. Avoid whole milk, cream, ice cream, egg yolks, and full-fat cheeses.  Healthier desserts include angel food cake, ginger snaps, animal crackers, hard candy, popsicles, and low-fat or nonfat frozen yogurt. Avoid pastries, cakes, pies, and cookies.  Exercise  Follow your exercise program as told by your health care provider. A regular program: ? Helps to decrease LDL and raise HDL. ? Helps with  weight control.  Do things that increase your activity level, such as gardening, walking, and taking the stairs.  Ask your health care provider about ways that you can be more active in your daily life.  Medicine  Take over-the-counter and prescription medicines only as told by your health care provider. ? Medicine may be prescribed by your health care provider to help lower cholesterol and decrease the risk for heart disease. This is usually done if diet and exercise have failed to bring down cholesterol levels. ? If  you have several risk factors, you may need medicine even if your levels are normal.  This information is not intended to replace advice given to you by your health care provider. Make sure you discuss any questions you have with your health care provider. Document Released: 10/14/2000 Document Revised: 08/17/2015 Document Reviewed: 07/20/2015 Elsevier Interactive Patient Education  2018 [place] Choices to Lower Your Triglycerides Triglycerides are a type of fat in your blood. High levels of triglycerides can increase the risk of heart disease and stroke. If your triglyceride levels are high, the foods you eat and your eating habits are very important. Choosing the right foods can help lower your triglycerides. What general guidelines do I need to follow?  Lose weight if you are overweight.  Limit or avoid alcohol.  Fill one half of your plate with vegetables and green salads.  Limit fruit to two servings a day. Choose fruit instead of juice.  Make one fourth of your plate whole grains. Look for the word "whole" as the first word in the ingredient list.  Fill one fourth of your plate with lean protein foods.  Enjoy fatty fish (such as salmon, mackerel, sardines, and tuna) three times a week.  Choose healthy fats.  Limit foods high in starch and sugar.  Eat more home-cooked food and less restaurant, buffet, and fast food.  Limit fried foods.  Cook foods using methods other than frying.  Limit saturated fats.  Check ingredient lists to avoid foods with partially hydrogenated oils (trans fats) in them. What foods can I eat? Grains Whole grains, such as whole wheat or whole grain breads, crackers, cereals, and pasta. Unsweetened oatmeal, bulgur, barley, quinoa, or brown rice. Corn or whole wheat flour tortillas. Vegetables Fresh or frozen vegetables (raw, steamed, roasted, or grilled). Green salads. Fruits All fresh, canned (in natural juice), or frozen  fruits. Meat and Other Protein Products Ground beef (85% or leaner), grass-fed beef, or beef trimmed of fat. Skinless chicken or [place]. Ground chicken or [place]. Pork trimmed of fat. All fish and seafood. Eggs. Dried beans, peas, or lentils. Unsalted nuts or seeds. Unsalted canned or dry beans. Dairy Low-fat dairy products, such as skim or 1% milk, 2% or reduced-fat cheeses, low-fat ricotta or cottage cheese, or plain low-fat yogurt. Fats and Oils Tub margarines without trans fats. Light or reduced-fat mayonnaise and salad dressings. Avocado. Safflower, olive, or canola oils. Natural peanut or almond butter. The items listed above may not be a complete list of recommended foods or beverages. Contact your dietitian for more options. What foods are not recommended? Grains White bread. White pasta. White rice. Cornbread. Bagels, pastries, and croissants. Crackers that contain trans fat. Vegetables White potatoes. Corn. Creamed or fried vegetables. Vegetables in a cheese sauce. Fruits Dried fruits. Canned fruit in light or heavy syrup. Fruit juice. Meat and Other Protein Products Fatty cuts of meat. Ribs, chicken wings, bacon, sausage, bologna, salami, chitterlings, fatback, hot dogs, bratwurst,  and packaged luncheon meats. Dairy Whole or 2% milk, cream, half-and-half, and cream cheese. Whole-fat or sweetened yogurt. Full-fat cheeses. Nondairy creamers and whipped toppings. Processed cheese, cheese spreads, or cheese curds. Sweets and Desserts Corn syrup, sugars, honey, and molasses. Candy. Jam and jelly. Syrup. Sweetened cereals. Cookies, pies, cakes, donuts, muffins, and ice cream. Fats and Oils Butter, stick margarine, lard, shortening, ghee, or bacon fat. Coconut, palm kernel, or palm oils. Beverages Alcohol. Sweetened drinks (such as sodas, lemonade, and fruit drinks or punches). The items listed above may not be a complete list of foods and beverages to avoid. Contact your dietitian for  more information. This information is not intended to replace advice given to you by your health care provider. Make sure you discuss any questions you have with your health care provider. Document Released: 11/07/2003 Document Revised: 06/27/2015 Document Reviewed: 11/23/2012 Elsevier Interactive Patient Education  2017 [employer]   Hyperhidrosis It is normal to sweat when you are hot, being physically active, or feeling anxious. Sweating is a necessary function for your body. However, hyperhidrosis is when you sweat too much (excessively). Although hyperhidrosis is not dangerous, it can make you feel embarrassed. There are two kinds of hyperhidrosis:  Primary hyperhidrosis. The sweating usually localizes in one part of your body, such as your underarms, or in a few areas, such as your feet, face, armpits, and hands. This is the more common kind of hyperhidrosis.  Secondary hyperhidrosis. This type more likely affects your entire body.  What are the causes? The cause of your hyperhidrosis depends on the kind you have.  Primary hyperhidrosis may be caused by having sweat glands that are more active than normal.  Secondary hyperhidrosis is caused by an underlying condition. Possible conditions include: ? Diabetes. ? Gout. ? Certain medicines. ? Anxiety. ? Stroke. ? Obesity. ? Menopause. ? Overactive thyroid (hyperthyroidism). ? Tumors. ? Frostbite. ? Certain types of cancers. ? Alcoholism. ? Injury to your nervous system. ? Stroke. ? Parkinson disease.  What increases the risk? You may be at an increased risk for primary hyperhidrosis if you have a family history of it. What are the signs or symptoms? General symptoms of hyperhidrosis may include:  Feeling like you are sweating constantly, even while you are resting.  Having skin that peels or gets paler or softer in the areas where you sweat the most.  Being able to see sweat on your skin.  Symptoms of primary  hyperhidrosis may include:  Sweating in specific areas, such as your armpits, palms, feet, and face.  Sweating in the same location on both sides of your body.  Sweating only during the day.  Symptoms of secondary hyperhidrosis may include:  Sweating all over your body.  Sweating even while you sleep.  How is this diagnosed? Hyperhidrosis may be diagnosed by:  Medical history and physical exam.  Testing, such as: ? Sweat test. ? Paper test.  How is this treated? Your treatment will depend on the kind of hyperhidrosis you have and the parts of your body that are affected. If your hyperhidrosis is caused by an underlying condition, your treatment will address the cause. Treatment may include:  Strong antiperspirants. Your health care provider may give you a prescription.  Medicines taken by mouth.  Medicines injected by your health care provider. These may include small amounts of botulinum toxin.  Iontophoresis. This is a procedure that temporarily turns off the sweat glands in your hands and feet.  Surgery to remove your sweat  glands.  Sympathectomy. This is a procedure that cuts or destroys your nerves so that they do not send a signal to sweat.  Follow these instructions at home:  Take medicines only as directed by your health care provider.  Use antiperspirants as directed by your health care provider.  Limit or avoid foods or beverages that seem to increase your chances of sweating, such as: ? Spicy food. ? Caffeine. ? Alcohol. ? Foods that contain MSG.  If your feet sweat: ? Wear sandals, when possible. ? Do not wear cotton socks. Wear socks that remove or wick moisture from your feet. ? Wear leather shoes. ? Avoid wearing the same pair of shoes two days in a row.  Consider joining a hyperhidrosis support group. Contact a health care provider if:  You have new symptoms.  Your symptoms get worse. This information is not intended to replace advice  given to you by your health care provider. Make sure you discuss any questions you have with your health care provider. Document Released: 03/20/2005 Document Revised: 06/27/2015 Document Reviewed: 08/29/2013 Elsevier Interactive Patient Education  [employer].

## 2017-10-19 NOTE — Progress Notes (Addendum)
Chief Complaint  Patient presents with  . Follow-up   F/u  1. Right shoulder reverse shoulder surgery sch 11/03/17 with Dr. Roland Rack no chest pain patient has CKD likely 2/2 right nephrectomy no diabetes  2. C/o increased sweating chronically no new meds except myrbetriq 50 mg qd labs overall normal except chronic leukocytosis. No wt loss she is sweating during the day not night, no fever, disc SSRI, BB could cause sweating. TSH normal 08/2017  3. Chronic leukocytosis consider referral to [facility] H/O if continues  4. C/o dry skin and abrasions to arms using triple Abx oint  5. Elevated triglycerides pt not on fish oil for now disc goal tgs <150   Review of Systems  Constitutional: Negative for weight loss.       +increased sweating   HENT: Negative for hearing loss.   Eyes: Negative for blurred vision.  Respiratory: Negative for cough.   Cardiovascular: Negative for chest pain.  Gastrointestinal: Negative for abdominal pain.  Skin: Negative for rash.       +abrasions  Neurological: Negative for headaches.  Psychiatric/Behavioral: Negative for depression.   Past Medical History:  Diagnosis Date  . Anxiety   . Asthma   . Cataract    s/p b/l repair   . Chicken pox   . CKD (chronic kidney disease)   . CKD (chronic kidney disease), stage III ([place])    a. s/p R nephrectomy.  . Conversion disorder   . COPD (chronic obstructive pulmonary disease) ([place])   . Depression   . Essential hypertension   . GERD (gastroesophageal reflux disease)   . Hyperlipidemia   . Inflammatory arthritis    a. hands/carpal tunnel.  b. Low titer rheumatoid factor. c. Negative anti-CCP antibodies. d. Plaquenil.  . Non-Obstructive CAD    a. 07/2009 Cath ([facility]): nonobs dzs;  b. 03/2011 Cath [facility]): nonobs dzs.  . Osteoarthritis    a. Knees.  . PUD (peptic ulcer disease)   . S/P right hip fracture    11/01/16 s/p repair  . Spinal stenosis at L4-L5 level    severe with L4/L5 anterolisthesis grade 1 anterolisthesis    . Toxic maculopathy   . Valvular heart disease    a. 07/2015 Echo: EF 55-60%, Mild AI, AS, MR, and TR.   Past Surgical History:  Procedure Laterality Date  . APPENDECTOMY    . BACK SURGERY    . BUNIONECTOMY    . CATARACT EXTRACTION, BILATERAL    . CESAREAN SECTION     x1  . CHOLECYSTECTOMY N/A 05/11/2016   Procedure: LAPAROSCOPIC CHOLECYSTECTOMY;  Surgeon: Florene Glen, MD;  Location: [facility] ORS;  Service: General;  Laterality: N/A;  . COLONOSCOPY WITH PROPOFOL N/A 04/02/2016   Procedure: COLONOSCOPY WITH PROPOFOL;  Surgeon: Jonathon Bellows, MD;  Location: [facility] ENDOSCOPY;  Service: Endoscopy;  Laterality: N/A;  . ENDOSCOPIC RETROGRADE CHOLANGIOPANCREATOGRAPHY (ERCP) WITH PROPOFOL N/A 05/08/2016   Procedure: ENDOSCOPIC RETROGRADE CHOLANGIOPANCREATOGRAPHY (ERCP) WITH PROPOFOL;  Surgeon: Lucilla Lame, MD;  Location: [facility] ENDOSCOPY;  Service: Endoscopy;  Laterality: N/A;  . ERCP     with biliary spincterotomy 05/08/16 Dr. Allen Norris for choledocholithiasis   . ESOPHAGEAL DILATION  04/02/2016   Procedure: ESOPHAGEAL DILATION;  Surgeon: Jonathon Bellows, MD;  Location: [facility] ENDOSCOPY;  Service: Endoscopy;;  . ESOPHAGOGASTRODUODENOSCOPY (EGD) WITH PROPOFOL N/A 04/02/2016   Procedure: ESOPHAGOGASTRODUODENOSCOPY (EGD) WITH PROPOFOL;  Surgeon: Jonathon Bellows, MD;  Location: [facility] ENDOSCOPY;  Service: Endoscopy;  Laterality: N/A;  . HIP ARTHROPLASTY Right 11/01/2016   Procedure: ARTHROPLASTY BIPOLAR HIP (  HEMIARTHROPLASTY);  Surgeon: Corky Mull, MD;  Location: [facility] ORS;  Service: Orthopedics;  Laterality: Right;  . 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 osteoporosis     noted DEXA 08/19/16   . REPLACEMENT TOTAL KNEE Right   . TONSILLECTOMY    . TOTAL HIP ARTHROPLASTY  12/10/11   [facility] left hip  . TOTAL HIP ARTHROPLASTY Bilateral   . TUBAL LIGATION     Family History  Problem Relation Age of Onset  . Rheum arthritis Mother   . Asthma Mother   . Parkinson's disease Mother    . Heart disease Mother   . Stroke Mother   . Hypertension Mother   . Heart attack Father   . Heart disease Father   . Hypertension Father   . Diabetes Son   . Gout Son   . Asthma Sister   . Heart disease Sister   . Lung cancer Sister   . Heart disease Sister   . Heart disease Sister   . Breast cancer Sister   . Heart attack Sister   . Heart disease Brother   . Heart disease Maternal Grandmother   . Diabetes Maternal Grandmother   . Colon cancer Maternal Grandmother   . Cancer Maternal Grandmother        Hodgkins lymphoma  . Heart disease Brother   . Alcohol abuse Brother   . Depression Brother   . Dementia Son    Social History   Socioeconomic History  . Marital status: Married    Spouse name: richard  . Number of children: 2  . Years of education: Some Coll  . Highest education level: Some college, no degree  Occupational History  . Occupation: Retired    Comment: retired  Scientific laboratory technician  . Financial resource strain: Very hard  . Food insecurity:    Worry: Often true    Inability: Often true  . Transportation needs:    Medical: No    Non-medical: No  Tobacco Use  . Smoking status: Former Smoker    Packs/day: 0.50    Years: 20.00    Pack years: 10.00    Types: Cigarettes    Last attempt to quit: 02/02/1974    Years since quitting: 43.7  . Smokeless tobacco: Never Used  Substance and Sexual Activity  . Alcohol use: No  . Drug use: No  . Sexual activity: Not Currently  Lifestyle  . Physical activity:    Days per week: 0 days    Minutes per session: 0 min  . Stress: Not on file  Relationships  . Social connections:    Talks on phone: More than three times a week    Gets together: Never    Attends religious service: More than 4 times per year    Active member of club or organization: No    Attends meetings of clubs or organizations: Never    Relationship status: Married  . Intimate partner violence:    Fear of current or ex partner: No    Emotionally  abused: No    Physically abused: No    Forced sexual activity: No  Other Topics Concern  . Not on file  Social History Narrative   Lives at home in [place] with her husband and grandson.   Right-handed.   6 cups coffee per day.   Current Meds  Medication Sig  . albuterol (VENTOLIN HFA) 108 (90 Base) MCG/ACT inhaler Inhale 2  puffs into the lungs every 6 (six) hours as needed. (Patient taking differently: Inhale 2 puffs into the lungs every 6 (six) hours as needed for wheezing or shortness of breath. )  . Cholecalciferol (VITAMIN D3) 50000 units CAPS Take 1 capsule by mouth once a week. Same day (Patient taking differently: Take 50,000 Units by mouth every Saturday. )  . clotrimazole (LOTRIMIN) 1 % cream Apply 1 application topically 2 (two) times daily. Bid to abdomen 7-10 days (Patient taking differently: Apply 1 application topically 2 (two) times daily as needed (for irritation). )  . escitalopram (LEXAPRO) 10 MG tablet Take 1 tablet (10 mg total) by mouth daily.  . fluticasone furoate-vilanterol (BREO ELLIPTA) 100-25 MCG/INH AEPB Inhale 1 puff into the lungs daily. Rinse mouth  . folic acid (FOLVITE) 1 MG tablet Take 1 mg by mouth daily.  Marland Kitchen gabapentin (NEURONTIN) 300 MG capsule Take 1 capsule (300 mg total) by mouth 4 (four) times daily. (Patient taking differently: Take 600 mg by mouth 2 (two) times daily. )  . lamoTRIgine (LAMICTAL) 100 MG tablet Take 1 tablet (100 mg total) by mouth 2 (two) times daily.  Marland Kitchen leflunomide (ARAVA) 20 MG tablet Take 1 tablet (20 mg total) by mouth daily.  Marland Kitchen LORazepam (ATIVAN) 0.5 MG tablet Take 1/2 pill at bed time (Patient taking differently: Take 0.25 mg by mouth at bedtime. )  . lovastatin (MEVACOR) 20 MG tablet TAKE 1 TABLET BY MOUTH AT BEDTIME (Patient taking differently: Take 20 mg by mouth at bedtime. )  . Melatonin 10 MG TABS Take 10 mg by mouth at bedtime. (Patient taking differently: Take 12 mg by mouth at bedtime. )  . montelukast (SINGULAIR) 10  MG tablet Take 1 tablet (10 mg total) by mouth daily.  . multivitamin-lutein (OCUVITE-LUTEIN) CAPS capsule Take 1 capsule by mouth 2 (two) times daily.  . nebivolol (BYSTOLIC) 10 MG tablet Take 1 tablet (10 mg total) by mouth daily. (Patient taking differently: Take 5 mg by mouth daily. )  . ondansetron (ZOFRAN) 4 MG tablet Take 1 tablet (4 mg total) by mouth every 8 (eight) hours as needed for nausea or vomiting.  Marland Kitchen oxyCODONE (OXY IR/ROXICODONE) 5 MG immediate release tablet Take 1 tablet (5 mg total) by mouth every 6 (six) hours as needed for breakthrough pain.  . pantoprazole (PROTONIX) 40 MG tablet Take 1 tablet (40 mg total) by mouth daily. 30 minutes before food. Note reduction in frequency  . QUEtiapine (SEROQUEL) 25 MG tablet Take 1 tablet (25 mg total) by mouth at bedtime.  . ranitidine (ZANTAC) 150 MG capsule Take 1 capsule (150 mg total) by mouth 2 (two) times daily.  . sucralfate (CARAFATE) 1 g tablet TAKE 1 TABLET BY MOUTH 4 TIMES A DAY WITH MEALS AND AT BEDTIME (Patient taking differently: Take 1 g by mouth 2 (two) times daily. )  . Teriparatide, Recombinant, (FORTEO) 600 MCG/2.4ML SOLN Inject 0.08 mLs (20 mcg total) into the skin daily.  . traMADol (ULTRAM) 50 MG tablet Take 1 tablet (50 mg total) by mouth every 12 (twelve) hours as needed. (Patient taking differently: Take 50 mg by mouth every 12 (twelve) hours as needed for moderate pain. )   Allergies  Allergen Reactions  . Ceftin [Cefuroxime Axetil] Anaphylaxis and Other (See Comments)    Other reaction(s): Other (See Comments) REACTION: tongue and throat swell Other Reaction: TONGUE AND THROAT SWELLING REACTION: tongue and throat swell  . Lisinopril Anaphylaxis and Other (See Comments)    REACTION: tongue and  throat swelling (onset 10-10-09)  . Morphine Other (See Comments)    Per patient, low blood pressure issues, following morphine, that requires action to raise it back up. Drop in BP - can take small infrequent doses  .  Sulfasalazine Anaphylaxis and Other (See Comments)    Swelling tongue, lips, and jaws  . Aspirin Other (See Comments)    Sulfasalazine allergy cross reacts  . Antihistamines, Chlorpheniramine-Type Other (See Comments)    REACTION: makes pt hyper  . Antivert [Meclizine Hcl] Other (See Comments)    REACTION: bladder will not empty  . Decongestant [Pseudoephedrine Hcl]     Other reaction(s): Unknown REACTION: makes pt hyper REACTION: makes pt hyper  . Doxycycline     REACTION: GI upset  . Morphine And Related Other (See Comments)    Per patient, low blood pressure issues, following morphine, that requires action to raise it back up.  . Polymyxin B     Medication was in eye drops.  . Pseudoephedrine Hcl Other (See Comments)    REACTION: makes pt hyper  . Sulfa Antibiotics Other (See Comments)    Face swelling  . Xarelto [Rivaroxaban]     Other reaction(s): Other (See Comments) GI bleed Stomach burning, bleeding, and tar in stool GI bleed  . Adhesive [Tape] Rash  . Iodine Hives and Rash  . Levaquin [Levofloxacin In D5w] Rash  . Tetanus Toxoid Adsorbed Other (See Comments)    REACTION: rash, fever, hot to touch at injection site  . Tetanus Toxoids Other (See Comments)    REACTION: rash, fever, hot to touch at injection site   Recent Results (from the past 2160 hour(s))  TSH     Status: None   Collection Time: 08/06/17  9:10 AM  Result Value Ref Range   TSH 2.72 0.40 - 4.50 mIU/L  Hemoglobin A1c     Status: None   Collection Time: 08/06/17  9:10 AM  Result Value Ref Range   Hgb A1c MFr Bld 5.6 <5.7 % of total Hgb    Comment: For the purpose of screening for the presence of diabetes: . <5.7%       Consistent with the absence of diabetes 5.7-6.4%    Consistent with increased risk for diabetes             (prediabetes) > or =6.5%  Consistent with diabetes . This assay result is consistent with a decreased risk of diabetes. . Currently, no consensus exists regarding use  of hemoglobin A1c for diagnosis of diabetes in children. . According to American Diabetes Association (ADA) guidelines, hemoglobin A1c <7.0% represents optimal control in non-pregnant diabetic patients. Different metrics may apply to specific patient populations.  Standards of Medical Care in Diabetes(ADA). .    Mean Plasma Glucose 114 (calc)   eAG (mmol/L) 6.3 (calc)  Lipid panel     Status: Abnormal   Collection Time: 08/06/17  9:10 AM  Result Value Ref Range   Cholesterol 162 <200 mg/dL   HDL 42 (L) >50 mg/dL   Triglycerides 262 (H) <150 mg/dL   LDL Cholesterol (Calc) 86 mg/dL (calc)    Comment: Reference range: <100 . Desirable range <100 mg/dL for primary prevention;   <70 mg/dL for patients with CHD or diabetic patients  with > or = 2 CHD risk factors. Marland Kitchen LDL-C is now calculated using the Martin-Hopkins  calculation, which is a validated novel method providing  better accuracy than the Friedewald equation in the  estimation of LDL-C.  Cresenciano Genre  et al. JAMA. 0454;098(11): 2061-2068  (http://education.[facility].com/faq/FAQ164)    Total CHOL/HDL Ratio 3.9 <5.0 (calc)   Non-HDL Cholesterol (Calc) 120 <130 mg/dL (calc)    Comment: For patients with diabetes plus 1 major ASCVD risk  factor, treating to a non-HDL-C goal of <100 mg/dL  (LDL-C of <70 mg/dL) is considered a therapeutic  option.   CBC with Differential/Platelet     Status: Abnormal   Collection Time: 08/06/17  9:10 AM  Result Value Ref Range   WBC 11.4 (H) 3.8 - 10.8 Thousand/uL   RBC 4.85 3.80 - 5.10 Million/uL   Hemoglobin 13.8 11.7 - 15.5 g/dL   HCT 41.9 35.0 - 45.0 %   MCV 86.4 80.0 - 100.0 fL   MCH 28.5 27.0 - 33.0 pg   MCHC 32.9 32.0 - 36.0 g/dL   RDW 13.4 11.0 - 15.0 %   Platelets 224 140 - 400 Thousand/uL   MPV 12.9 (H) 7.5 - 12.5 fL   Neutro Abs 6,977 1,500 - 7,800 cells/uL   Lymphs Abs 2,314 850 - 3,900 cells/uL   WBC mixed population 912 200 - 950 cells/uL   Eosinophils Absolute  1,072 (H) 15 - 500 cells/uL   Basophils Absolute 125 0 - 200 cells/uL   Neutrophils Relative % 61.2 %   Total Lymphocyte 20.3 %   Monocytes Relative 8.0 %   Eosinophils Relative 9.4 %   Basophils Relative 1.1 %  Comprehensive metabolic panel     Status: Abnormal   Collection Time: 08/06/17  9:10 AM  Result Value Ref Range   Glucose, Bld 112 (H) 65 - 99 mg/dL    Comment: .            Fasting reference interval . For someone without known diabetes, a glucose value between 100 and 125 mg/dL is consistent with prediabetes and should be confirmed with a follow-up test. .    BUN 22 7 - 25 mg/dL   Creat 1.27 (H) 0.60 - 0.93 mg/dL    Comment: For patients >28 years of age, the reference limit for Creatinine is approximately 13% higher for people identified as African-American. .    BUN/Creatinine Ratio 17 6 - 22 (calc)   Sodium 140 135 - 146 mmol/L   Potassium 5.1 3.5 - 5.3 mmol/L   Chloride 101 98 - 110 mmol/L   CO2 23 20 - 32 mmol/L   Calcium 9.4 8.6 - 10.4 mg/dL   Total Protein 6.4 6.1 - 8.1 g/dL   Albumin 4.1 3.6 - 5.1 g/dL   Globulin 2.3 1.9 - 3.7 g/dL (calc)   AG Ratio 1.8 1.0 - 2.5 (calc)   Total Bilirubin CANCELED     Comment:    Alkaline phosphatase (APISO) 111 33 - 130 U/L   AST 17 10 - 35 U/L   ALT 11 6 - 29 U/L  VITAMIN D 25 Hydroxy (Vit-D Deficiency, Fractures)     Status: None   Collection Time: 08/06/17  9:10 AM  Result Value Ref Range   Vit D, 25-Hydroxy 53 30 - 100 ng/mL    Comment: Vitamin D Status         25-OH Vitamin D: . Deficiency:                    <20 ng/mL Insufficiency:             20 - 29 ng/mL Optimal:                 >  or = 30 ng/mL . For 25-OH Vitamin D testing on patients on  D2-supplementation and patients for whom quantitation  of D2 and D3 fractions is required, the [facility](TM) 25-OH VIT D, (D2,D3), LC/MS/MS is recommended: order  code [id number] (patients >31yr). . For more information on this test, go  to: http://education.[facility].com/faq/FAQ163 (This link is being provided for  informational/educational purposes only.)   Iron, TIBC and Ferritin Panel     Status: None   Collection Time: 08/06/17  9:10 AM  Result Value Ref Range   Iron 69 45 - 160 mcg/dL   Ferritin 252 16 - 288 ng/mL  Urinalysis, Routine w reflex microscopic     Status: None   Collection Time: 08/06/17  9:18 AM  Result Value Ref Range   Specific Gravity, UA 1.017 1.005 - 1.030   pH, UA 7.0 5.0 - 7.5   Color, UA Yellow Yellow   Appearance Ur Clear Clear   Leukocytes, UA Negative Negative   Protein, UA Negative Negative/Trace   Glucose, UA Negative Negative   Ketones, UA Negative Negative   RBC, UA Negative Negative   Bilirubin, UA Negative Negative   Urobilinogen, Ur 0.2 0.2 - 1.0 mg/dL   Nitrite, UA Negative Negative   Microscopic Examination Comment     Comment: Microscopic not indicated and not performed.  Urinalysis, Complete     Status: None   Collection Time: 10/12/17  2:10 PM  Result Value Ref Range   Specific Gravity, UA 1.015 1.005 - 1.030   pH, UA 6.5 5.0 - 7.5   Color, UA Yellow Yellow   Appearance Ur Clear Clear   Leukocytes, UA Negative Negative   Protein, UA Negative Negative/Trace   Glucose, UA Negative Negative   Ketones, UA Negative Negative   RBC, UA Negative Negative   Bilirubin, UA Negative Negative   Urobilinogen, Ur 0.2 0.2 - 1.0 mg/dL   Nitrite, UA Negative Negative   Microscopic Examination See below:   Microscopic Examination     Status: Abnormal   Collection Time: 10/12/17  2:10 PM  Result Value Ref Range   WBC, UA None seen 0 - 5 /hpf   RBC, UA 0-2 0 - 2 /hpf   Epithelial Cells (non renal) 0-10 0 - 10 /hpf   Bacteria, UA Moderate (A) None seen/Few  BLADDER SCAN AMB NON-IMAGING     Status: None   Collection Time: 10/12/17  2:26 PM  Result Value Ref Range   Scan Result 631m   Objective  Body mass index is 30.3 kg/m. Wt Readings from Last 3 Encounters:   10/19/17 173 lb 12.8 oz (78.8 kg)  09/28/17 174 lb 9.6 oz (79.2 kg)  07/19/17 170 lb 12.8 oz (77.5 kg)   Temp Readings from Last 3 Encounters:  10/19/17 97.9 F (36.6 C) (Oral)  07/19/17 98.1 F (36.7 C) (Oral)  05/14/17 98.7 F (37.1 C) (Oral)   BP Readings from Last 3 Encounters:  10/19/17 136/70  10/12/17 (!) 103/59  09/28/17 (!) 110/58   Pulse Readings from Last 3 Encounters:  10/19/17 71  10/12/17 75  09/28/17 70    Physical Exam  Constitutional: She is oriented to person, place, and time. Vital signs are normal. She appears well-developed and well-nourished. She is cooperative.  HENT:  Head: Normocephalic and atraumatic.  Mouth/Throat: Oropharynx is clear and moist and mucous membranes are normal.  Eyes: Pupils are equal, round, and reactive to light. Conjunctivae are normal.  Cardiovascular: Normal rate and regular rhythm.  Murmur  heard. Pulmonary/Chest: Effort normal and breath sounds normal.  Neurological: She is alert and oriented to person, place, and time. Gait normal.  Skin: Skin is warm and dry. Abrasion noted.  Abrasions x 2 b/l forearms   Psychiatric: She has a normal mood and affect. Her speech is normal and behavior is normal. Judgment and thought content normal. Cognition and memory are normal.  Nursing note and vitals reviewed.   Assessment   1. Right shoulder pain revised cardiac risk index score  Low pre op  2. Increased sweating, leukocytosis, chronic 3. HLD 4. Murmur likely related to moderate AR, mild MR, mild TR, mild AS echo reviewed 07/24/16  5. HM Plan   1.  Low cardiac risk score Surgery 11/03/17 with Dr. Roland Rack  2. Check BMET, CBC, MMR today  If WBC elevated refer [place] H/O further w/u  Pt has family h/o lung and breast cancer in both sisters wondering about tumor markers as well disc CEA, CA 19-9, CA 25.27, CA 125 not sure if insurance will cover these Referred annual mammogram  3. Disc fish oil 2000 IU bid  On mevacor 20 mg qhs  consider increase dose in future to 40  Check lipid in future  4. F/u [facility]  5.  Had flu per pt had at pharmacy, prevnar, pna 23, allergic to Tdap, declines shingrix   mammo 01/19/17 negative ordered due 01/19/17  Colonoscopy 04/02/16 multiple polyps but tortuous colon GI does not want to do any further  Out of age window pap  DEXA 08/19/16 +osteoporosis on Forteo will need to repeat 1 year after Forteo use to see improvement rheumatology following.  -DEXA 10/12/17 with T score -4.1 worse on forteo will f/u endocrine upcoming 11/01/17   Seeing dermatology in [place] [facility] 08/2017  Do lipid with next labs   rheuma appt 01/17/18 left shoulder surgery sch 02/2018 s/p right shoulder surgery  C/w RA precet for Humira f/u in 4-6 weeks Dr. Precious Reel   Provider: Dr. Olivia Mackie McLean-Scocuzza-Internal Medicine

## 2017-10-19 NOTE — Progress Notes (Signed)
Pre visit review using our clinic review tool, if applicable. No additional management support is needed unless otherwise documented below in the visit note. 

## 2017-10-20 LAB — BASIC METABOLIC PANEL
BUN: 18 mg/dL (ref 6–23)
CO2: 27 mEq/L (ref 19–32)
Calcium: 9.4 mg/dL (ref 8.4–10.5)
Chloride: 103 mEq/L (ref 96–112)
Creatinine, Ser: 1.29 mg/dL — ABNORMAL HIGH (ref 0.40–1.20)
GFR: 42.81 mL/min — ABNORMAL LOW (ref >60.00)
Glucose, Bld: 147 mg/dL — ABNORMAL HIGH (ref 70–99)
Potassium: 4.2 mEq/L (ref 3.5–5.1)
Sodium: 140 mEq/L (ref 135–145)

## 2017-10-20 LAB — CBC WITH DIFFERENTIAL/PLATELET
Basophils Absolute: 0.1 10*3/uL (ref 0.0–0.1)
Basophils Relative: 1.4 % (ref 0.0–3.0)
Eosinophils Absolute: 0.2 10*3/uL (ref 0.0–0.7)
Eosinophils Relative: 2 % (ref 0.0–5.0)
HCT: 37.6 % (ref 36.0–46.0)
Hemoglobin: 12.4 g/dL (ref 12.0–15.0)
Lymphocytes Relative: 27 % (ref 12.0–46.0)
Lymphs Abs: 2.5 10*3/uL (ref 0.7–4.0)
MCHC: 33.1 g/dL (ref 30.0–36.0)
MCV: 87 fl (ref 78.0–100.0)
Monocytes Absolute: 0.6 10*3/uL (ref 0.1–1.0)
Monocytes Relative: 6.6 % (ref 3.0–12.0)
Neutro Abs: 5.9 10*3/uL (ref 1.4–7.7)
Neutrophils Relative %: 63 % (ref 43.0–77.0)
Platelets: 213 10*3/uL (ref 150.0–400.0)
RBC: 4.32 Mil/uL (ref 3.87–5.11)
RDW: 14.4 % (ref 11.5–15.5)
WBC: 9.3 10*3/uL (ref 4.0–10.5)

## 2017-10-21 ENCOUNTER — Encounter
Admission: RE | Admit: 2017-10-21 | Discharge: 2017-10-21 | Disposition: A | Discharge status: Home / Self Care | Payer: Medicare Other | Source: Ambulatory Visit | Attending: Surgery | Admitting: Surgery

## 2017-10-21 ENCOUNTER — Other Ambulatory Visit: Payer: Self-pay

## 2017-10-21 DIAGNOSIS — I10 Essential (primary) hypertension: Secondary | ICD-10-CM | POA: Insufficient documentation

## 2017-10-21 DIAGNOSIS — Z01818 Encounter for other preprocedural examination: Secondary | ICD-10-CM | POA: Diagnosis not present

## 2017-10-21 LAB — MEASLES/MUMPS/RUBELLA IMMUNITY
Mumps IgG: 19.3 AU/mL
Rubella: 2.74 index
Rubeola IgG: 69.9 AU/mL

## 2017-10-21 LAB — URINALYSIS, ROUTINE W REFLEX MICROSCOPIC
Bilirubin Urine: NEGATIVE
Glucose, UA: NEGATIVE mg/dL
Hgb urine dipstick: NEGATIVE
Ketones, ur: NEGATIVE mg/dL
Leukocytes, UA: NEGATIVE
Nitrite: NEGATIVE
Protein, ur: NEGATIVE mg/dL
Specific Gravity, Urine: 1.014 (ref 1.005–1.030)
pH: 7 (ref 5.0–8.0)

## 2017-10-21 LAB — SURGICAL PCR SCREEN
MRSA, PCR: NEGATIVE
Staphylococcus aureus: POSITIVE — AB

## 2017-10-21 LAB — PROTIME-INR
INR: 0.9
Prothrombin Time: 12.1 seconds (ref 11.4–15.2)

## 2017-10-21 NOTE — Patient Instructions (Signed)
Your procedure is scheduled on: November 04, 2017 THURSDAY Report to Day Surgery on the 2nd floor of the [facility]. To find out your arrival time, please call [phone] between 1PM - 3PM on: Wednesday November 03, 2017  REMEMBER: Instructions that are not followed completely may result in serious medical risk, up to and including death; or upon the discretion of your surgeon and anesthesiologist your surgery may need to be rescheduled.  Do not eat food after midnight the night before surgery.  No gum chewing, lozengers or hard candies.  You may however, drink CLEAR liquids up to 2 hours before you are scheduled to arrive for your surgery. Do not drink anything within 2 hours of the start of your surgery.  Clear liquids include: - water  - apple juice without pulp - CLEAR gatorade - black coffee or tea (Do NOT add milk or creamers to the coffee or tea) Do NOT drink anything that is not on this list.  Type 1 and Type 2 diabetics should only drink water.  No Alcohol for 24 hours before or after surgery.  No Smoking including e-cigarettes for 24 hours prior to surgery.  No chewable tobacco products for at least 6 hours prior to surgery.  No nicotine patches on the day of surgery.  On the morning of surgery brush your teeth with toothpaste and water, you may rinse your mouth with mouthwash if you wish. Do not swallow any toothpaste or mouthwash.  Notify your doctor if there is any change in your medical condition (cold, fever, infection).  Do not wear jewelry, make-up, hairpins, clips or nail polish.  Do not wear lotions, powders, or perfumes. You may NOT wear deodorant.  Do not shave 48 hours prior to surgery. Men may shave face and neck.  Contacts and dentures may not be worn into surgery.  Do not bring valuables to the hospital, including drivers license, insurance or credit cards.  [facility] is not responsible for any belongings or valuables.   TAKE THESE MEDICATIONS  THE MORNING OF SURGERY: LEXAPRO GABAPENTIN LAMICTAL ARAVA SINGULAR BYSTOLIC PROTONIX ADD DOSE MORNING OF SURGERY  Use CHG Soap as directed on instruction sheet.  Use inhalers on the day of surgery   Bring your C-PAP to the hospital with you in case you may have to spend the night.   Stop Anti-inflammatories (NSAIDS) such as Advil, Aleve, Ibuprofen, Motrin, Naproxen, Naprosyn and Aspirin based products such as Excedrin, Goodys Powder, BC Powder FOR 7 DAYS BEFORE SURGERY (May take Tylenol or Acetaminophen if needed.)  Stop ANY OVER THE COUNTER supplements until after surgery FOLIC ACID ANDMELATONIN FOR 7 DAYS BEFORE SURGERY (May continue Vitamin D, Vitamin B, and multivitamin.)  Wear comfortable clothing (specific to your surgery type) to the hospital.  Plan for stool softeners for home use.  If you are being admitted to the hospital overnight, leave your suitcase in the car. After surgery it may be brought to your room.  If you are being discharged the day of surgery, you will not be allowed to drive home. You will need a responsible adult to drive you home and stay with you that night.   If you are taking public transportation, you will need to have a responsible adult with you. Please confirm with your physician that it is acceptable to use public transportation.   Please call [phone] if you have any questions about these instructions.

## 2017-10-21 NOTE — Pre-Procedure Instructions (Signed)
Message left at Dr Poggi's office re: ancef order. Pt has allergy to Ceftin - anaphylaxis.

## 2017-10-22 ENCOUNTER — Encounter
Admission: RE | Admit: 2017-10-22 | Discharge: 2017-10-22 | Disposition: A | Discharge status: Home / Self Care | Payer: Medicare Other | Source: Ambulatory Visit | Attending: Surgery | Admitting: Surgery

## 2017-10-22 DIAGNOSIS — Z01818 Encounter for other preprocedural examination: Secondary | ICD-10-CM | POA: Diagnosis not present

## 2017-10-22 LAB — URINE CULTURE: Culture: <10000 — AB

## 2017-10-22 LAB — TYPE AND SCREEN
ABO/RH(D): AB POS
Antibody Screen: NEGATIVE

## 2017-10-22 NOTE — Pre-Procedure Instructions (Signed)
Positive PCR screen - positive for staph faxed to Dr Nicholaus Bloom office

## 2017-10-30 IMAGING — CT CT HEAD W/O CM
3 series · 15 of 47 positions shown, 18 images · non-contrast
Comparison: 07/13/2015 brain MRI.

CLINICAL DATA: Fall at home today. Head injury. Right hip fracture.

EXAM:
CT HEAD WITHOUT CONTRAST
TECHNIQUE: Contiguous axial images were obtained from the base of the skull
through the vertex without intravenous contrast.

[Series 2: head wo · axial · 0.41mm/px · z∈[-142,-7]mm · 9 of 33 slices shown, 12 images]
[im 3/33  brain]
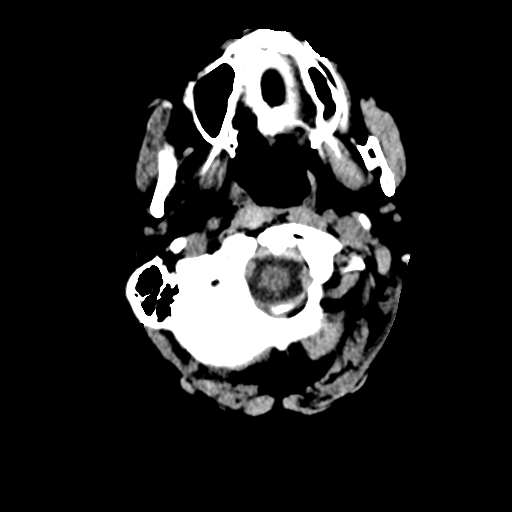
[im 3/33  bone]
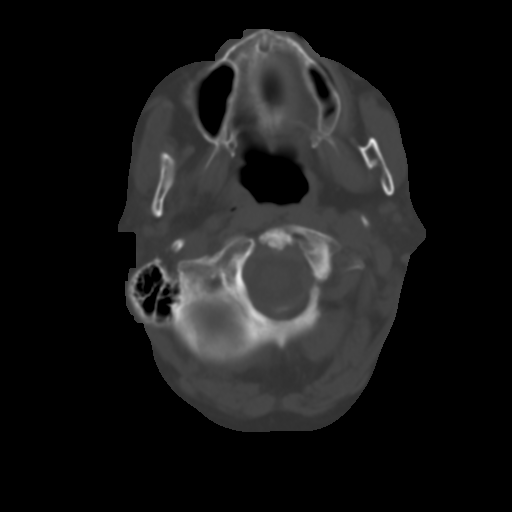
[im 6/33  brain]
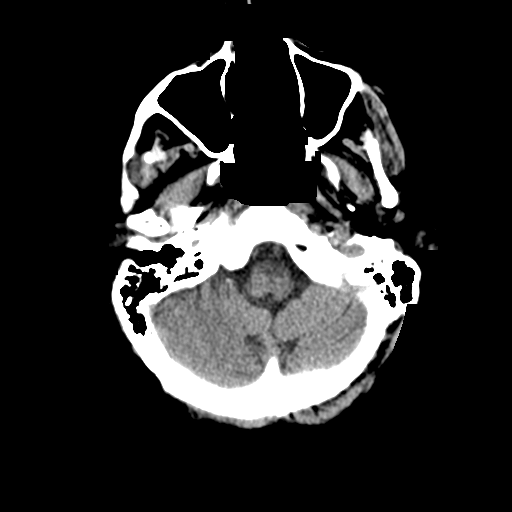
[im 9/33  brain]
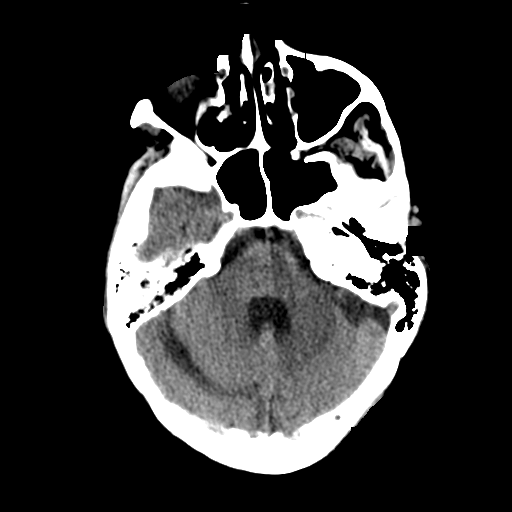
[im 13/33  brain]
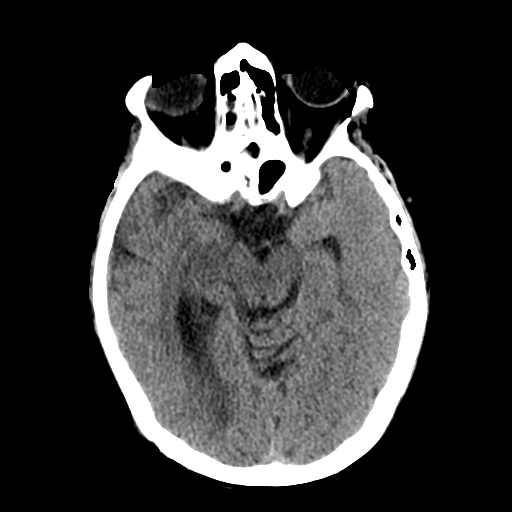
[im 17/33  brain]
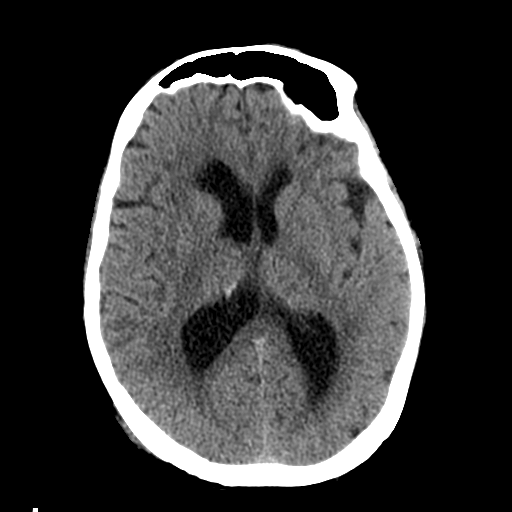
[im 17/33  bone]
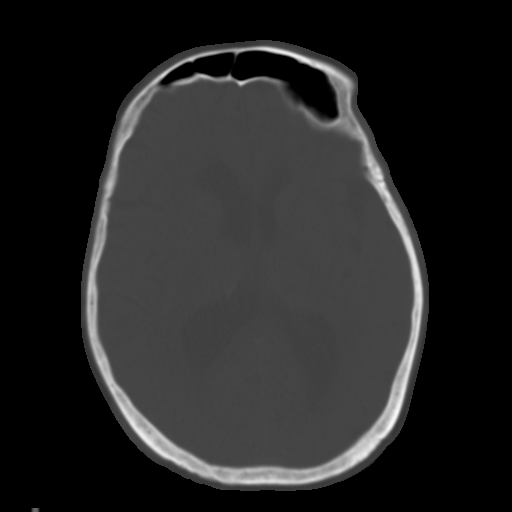
[im 20/33  brain]
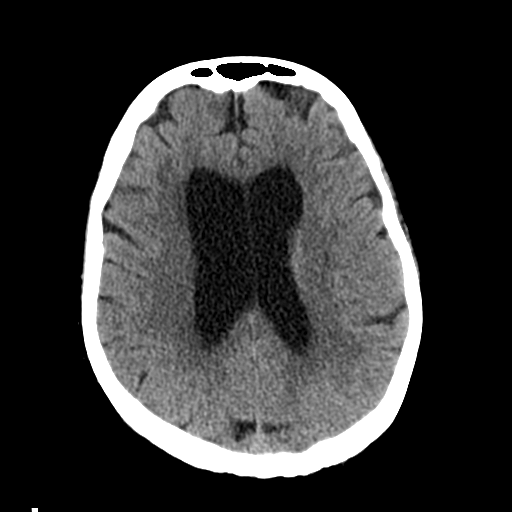
[im 24/33  brain]
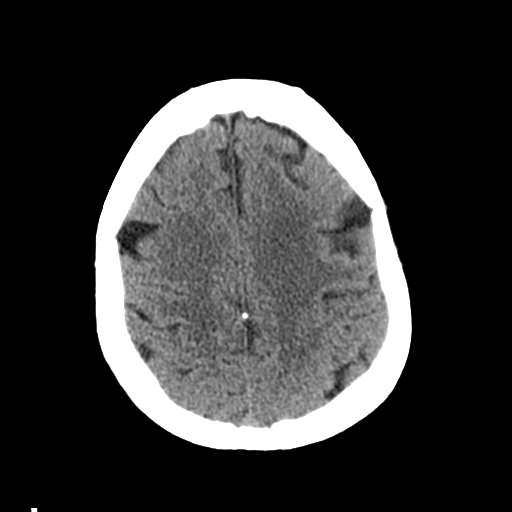
[im 27/33  brain]
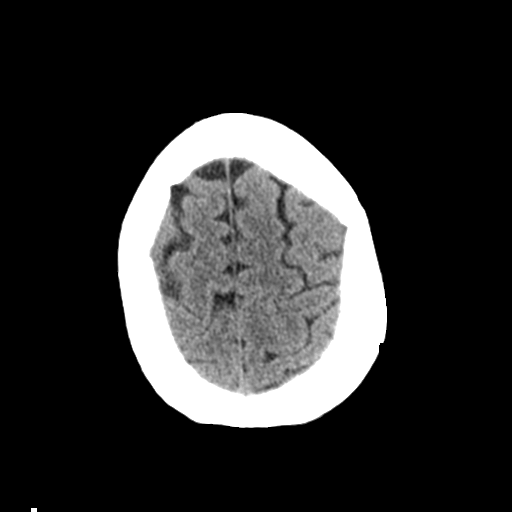
[im 30/33  brain]
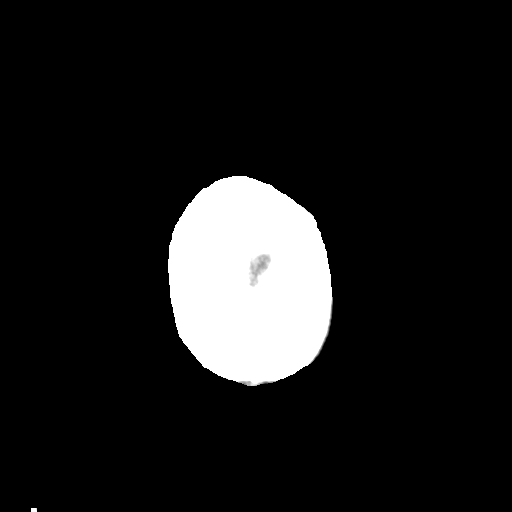
[im 30/33  bone]
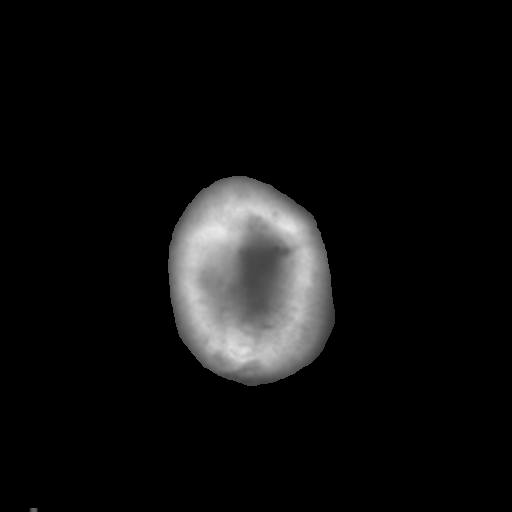

[Series 4: coronal soft tissue · coronal · 0.34mm/px · 3 of 67 slices shown]
[im 23/67  brain]
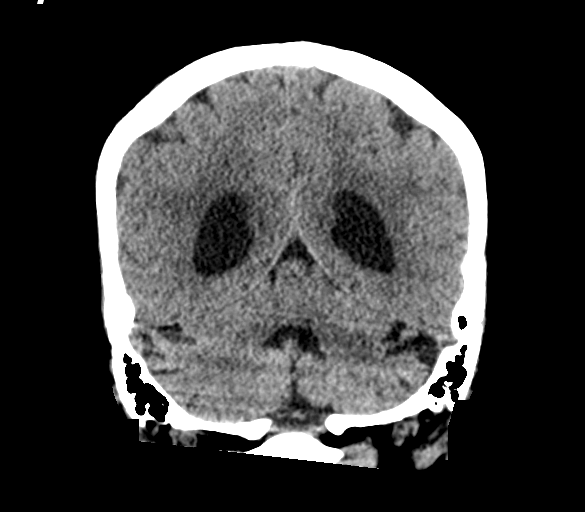
[im 30/67  brain]
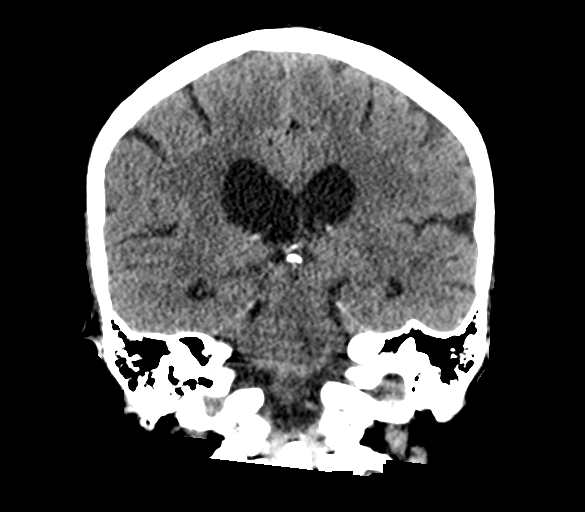
[im 37/67  brain]
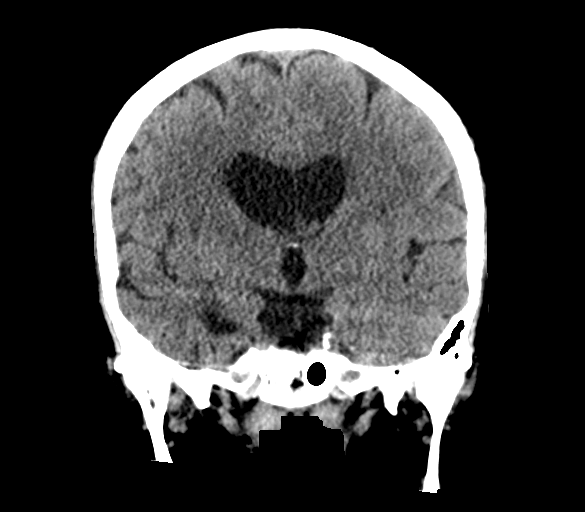

[Series 5: sagittal soft tissue · sagittal · 0.33mm/px · 3 of 51 slices shown]
[im 17/51  brain]
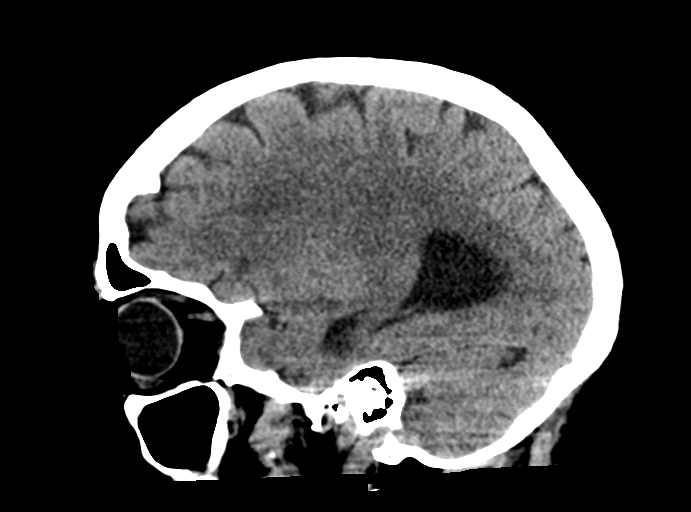
[im 26/51  brain]
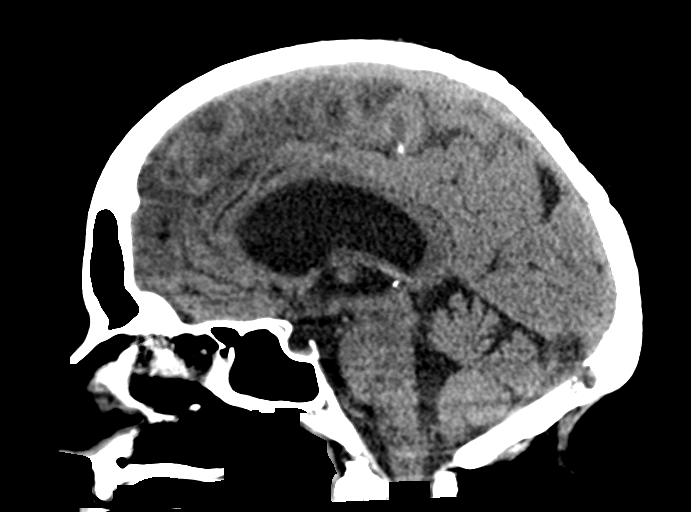
[im 34/51  brain]
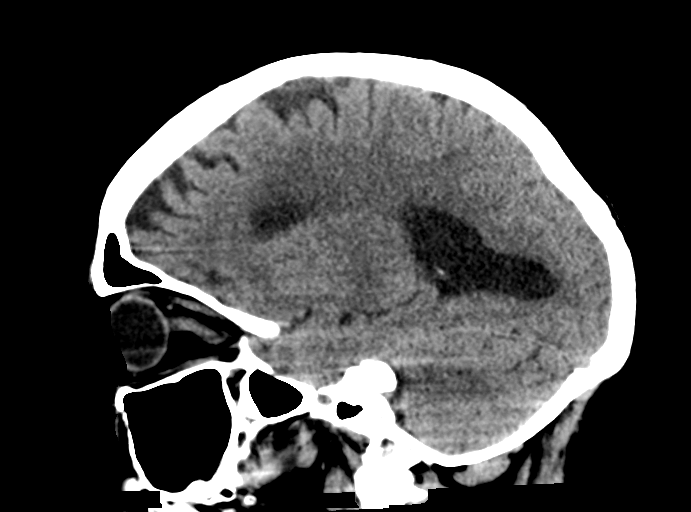

[15 of 47 positions shown; findings below may reference images not displayed]

FINDINGS: Brain: No evidence of parenchymal hemorrhage or extra-axial fluid
collection. No mass lesion, mass effect, or midline shift. No CT
evidence of acute infarction. Nonspecific mild subcortical and
periventricular white matter hypodensity, most in keeping with
chronic small vessel ischemic change. Generalized cerebral volume
loss. Cerebral ventricle sizes are stable and concordant with the
degree of cerebral volume loss.

Vascular: No acute abnormality.

Skull: No evidence of calvarial fracture.

Sinuses/Orbits: Mild mucoperiosteal thickening throughout the
paranasal sinuses without fluid levels.

Other: Small right posterior parietal scalp contusion. The mastoid
air cells are unopacified.
IMPRESSION: 1. Small right posterior parietal scalp contusion.
2. No evidence of acute intracranial abnormality. No evidence of
calvarial fracture.
3. Generalized cerebral volume loss and mild chronic small vessel
ischemic changes.

## 2017-10-30 IMAGING — CR DG CHEST 1V
1 series · 1 of 1 positions shown · non-contrast
Comparison: None.

CLINICAL DATA: Right hip pain after fall.

EXAM:
CHEST 1 VIEW

[dg chest 1 view]
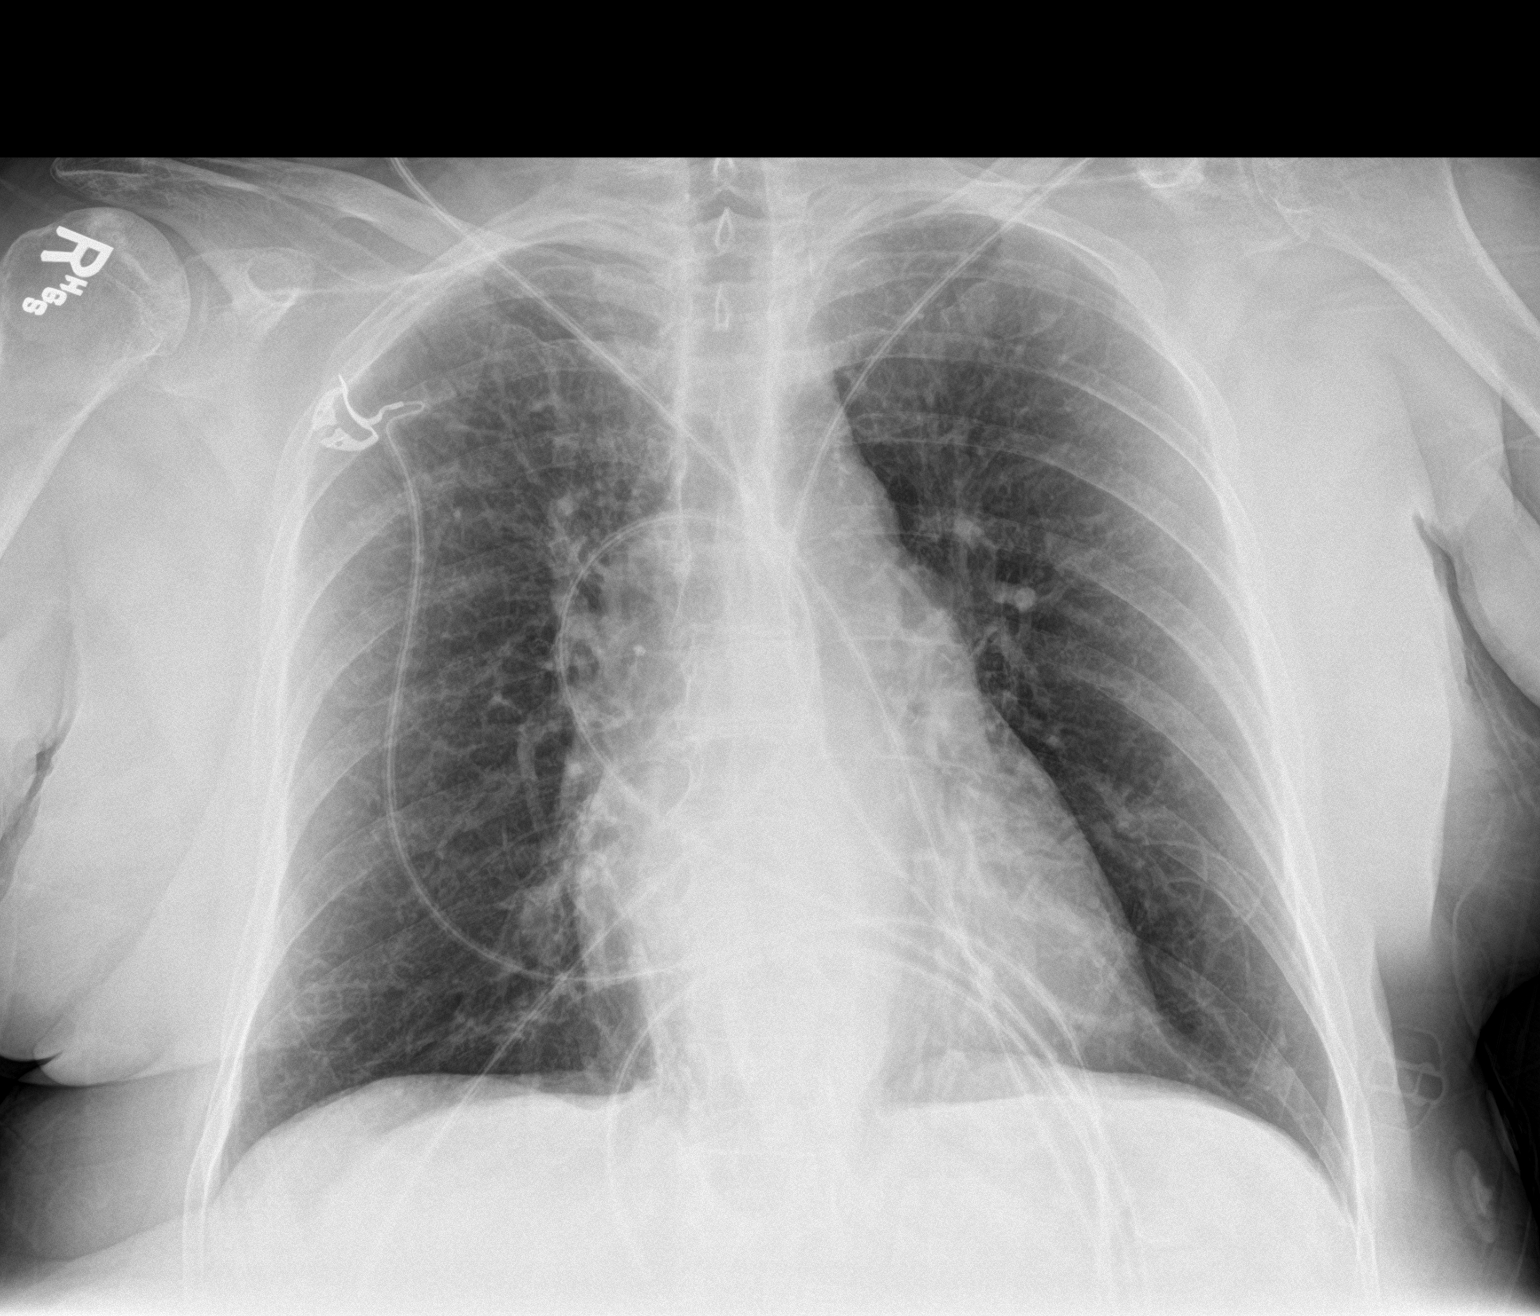

[1 of 1 positions shown; findings below may reference images not displayed]

FINDINGS: The heart size and mediastinal contours are within normal limits.
Both lungs are clear. No pneumothorax or pleural effusion is noted.
The visualized skeletal structures are unremarkable.
IMPRESSION: No acute cardiopulmonary abnormality seen.

## 2017-10-30 IMAGING — CR DG HIP (WITH OR WITHOUT PELVIS) 2-3V*R*
1 series · 3 of 3 positions shown · non-contrast
Comparison: 06/12/2016 CT abdomen/pelvis.

CLINICAL DATA: Fall.  Right hip pain.

EXAM:
DG HIP (WITH OR WITHOUT PELVIS) 2-3V RIGHT

[Series 1: dg hip unilat w or w/o pelvis 2-3 views  · non-contrast · 0.14mm/px · 3 of 3 slices shown]
[im 1/3]
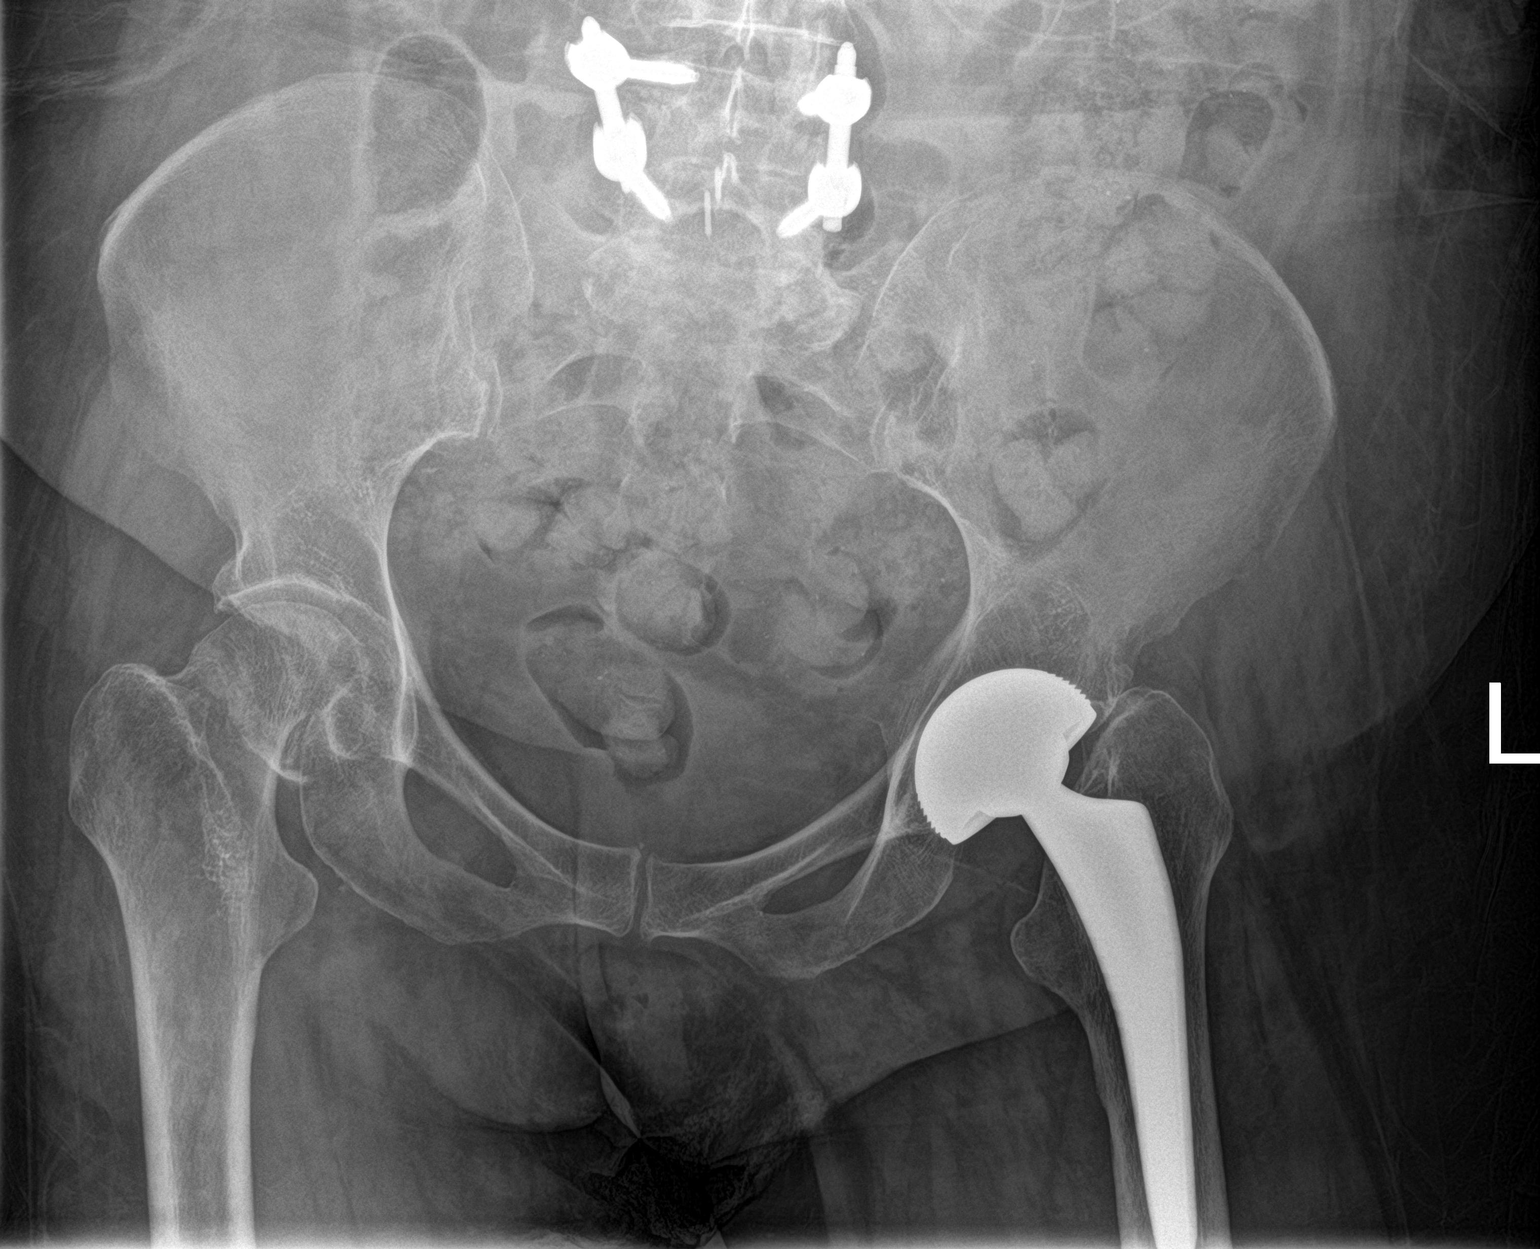
[im 2/3]
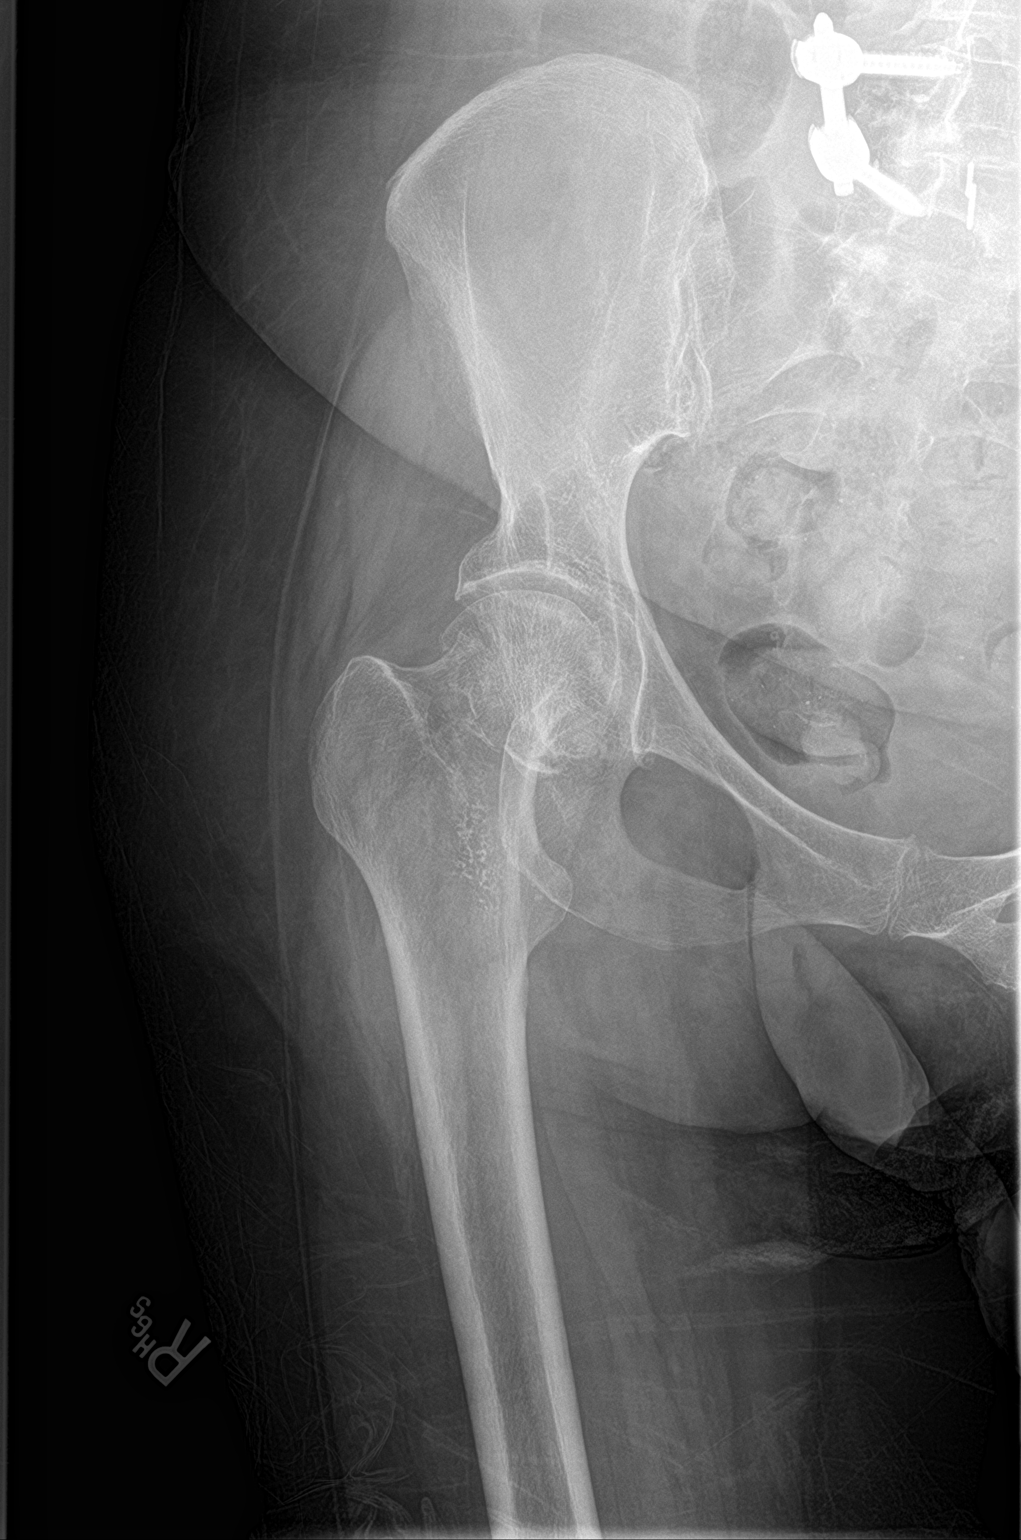
[im 3/3]
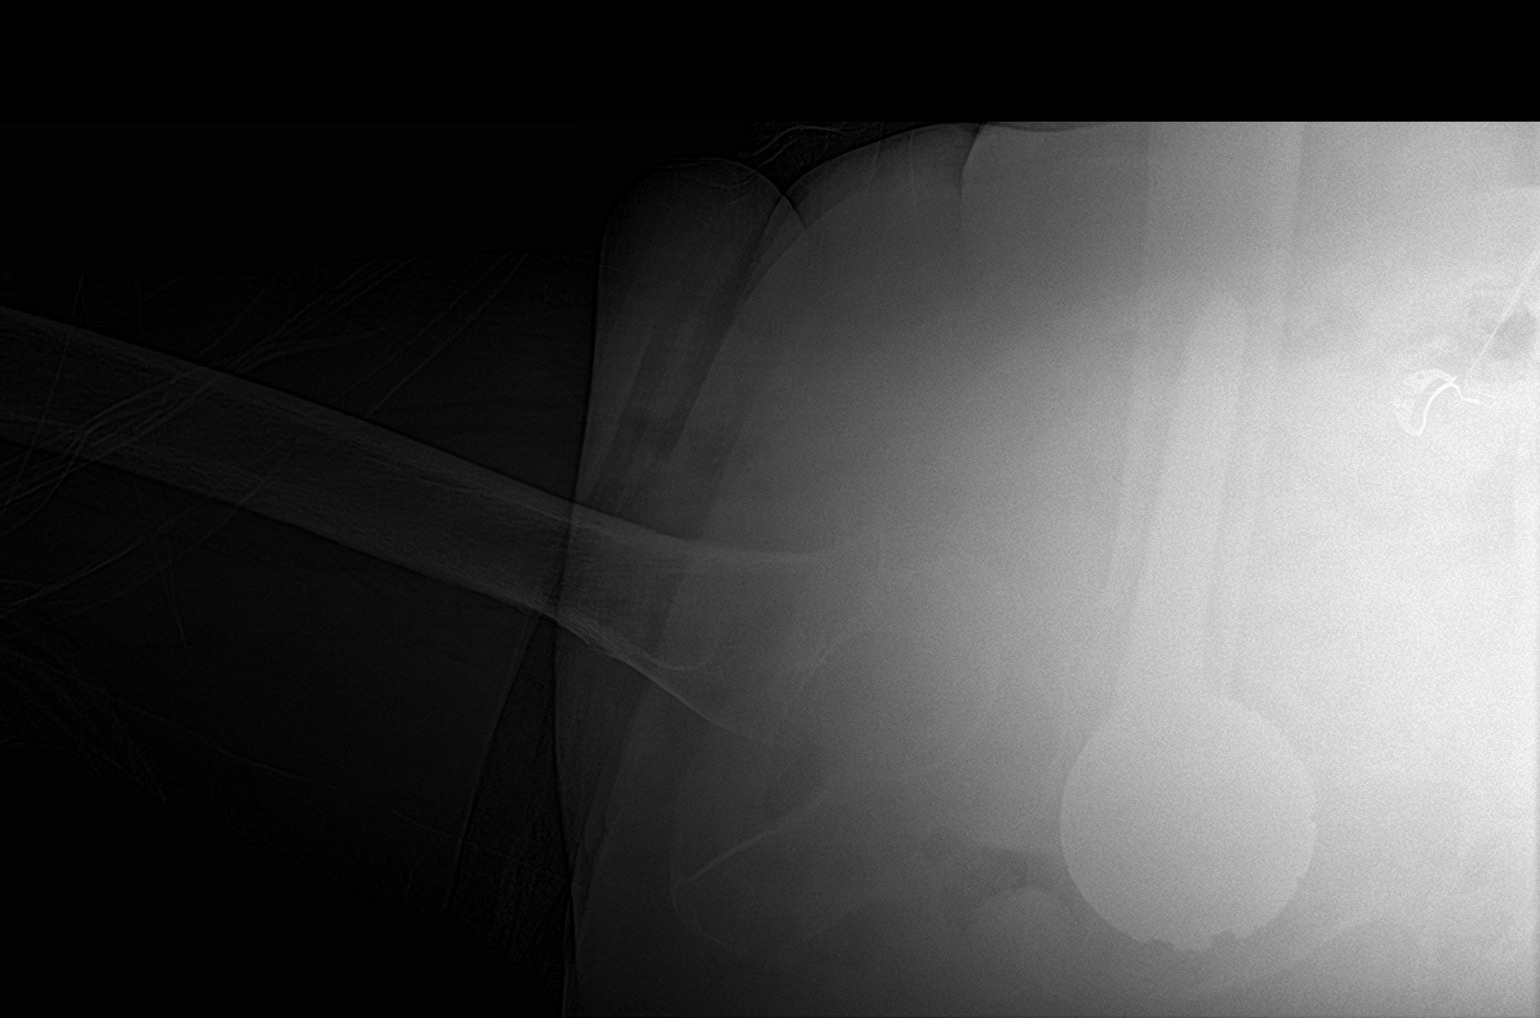

[3 of 3 positions shown; findings below may reference images not displayed]

FINDINGS: Partially visualized left total hip arthroplasty, with no evidence
of hardware fracture or loosening. There is an acute impacted
subcapital right femoral neck fracture, nondisplaced. No right hip
dislocation. No suspicious focal osseous lesions. No additional
osseous fractures. No pelvic diastasis. Bilateral posterior lower
lumbar fusion hardware.
IMPRESSION: Acute nondisplaced impacted subcapital right femoral neck fracture.

## 2017-10-31 IMAGING — DX DG HIP (WITH OR WITHOUT PELVIS) 2-3V*R*
2 series · 3 of 3 positions shown · non-contrast
Comparison: 10/31/2016.

CLINICAL DATA: Status post right hip hemiarthroplasty.

EXAM:
DG HIP (WITH OR WITHOUT PELVIS) 2-3V RIGHT

[Series 1: pelvis ap · 0.14mm/px · 2 of 2 slices shown]
[im 1/2]
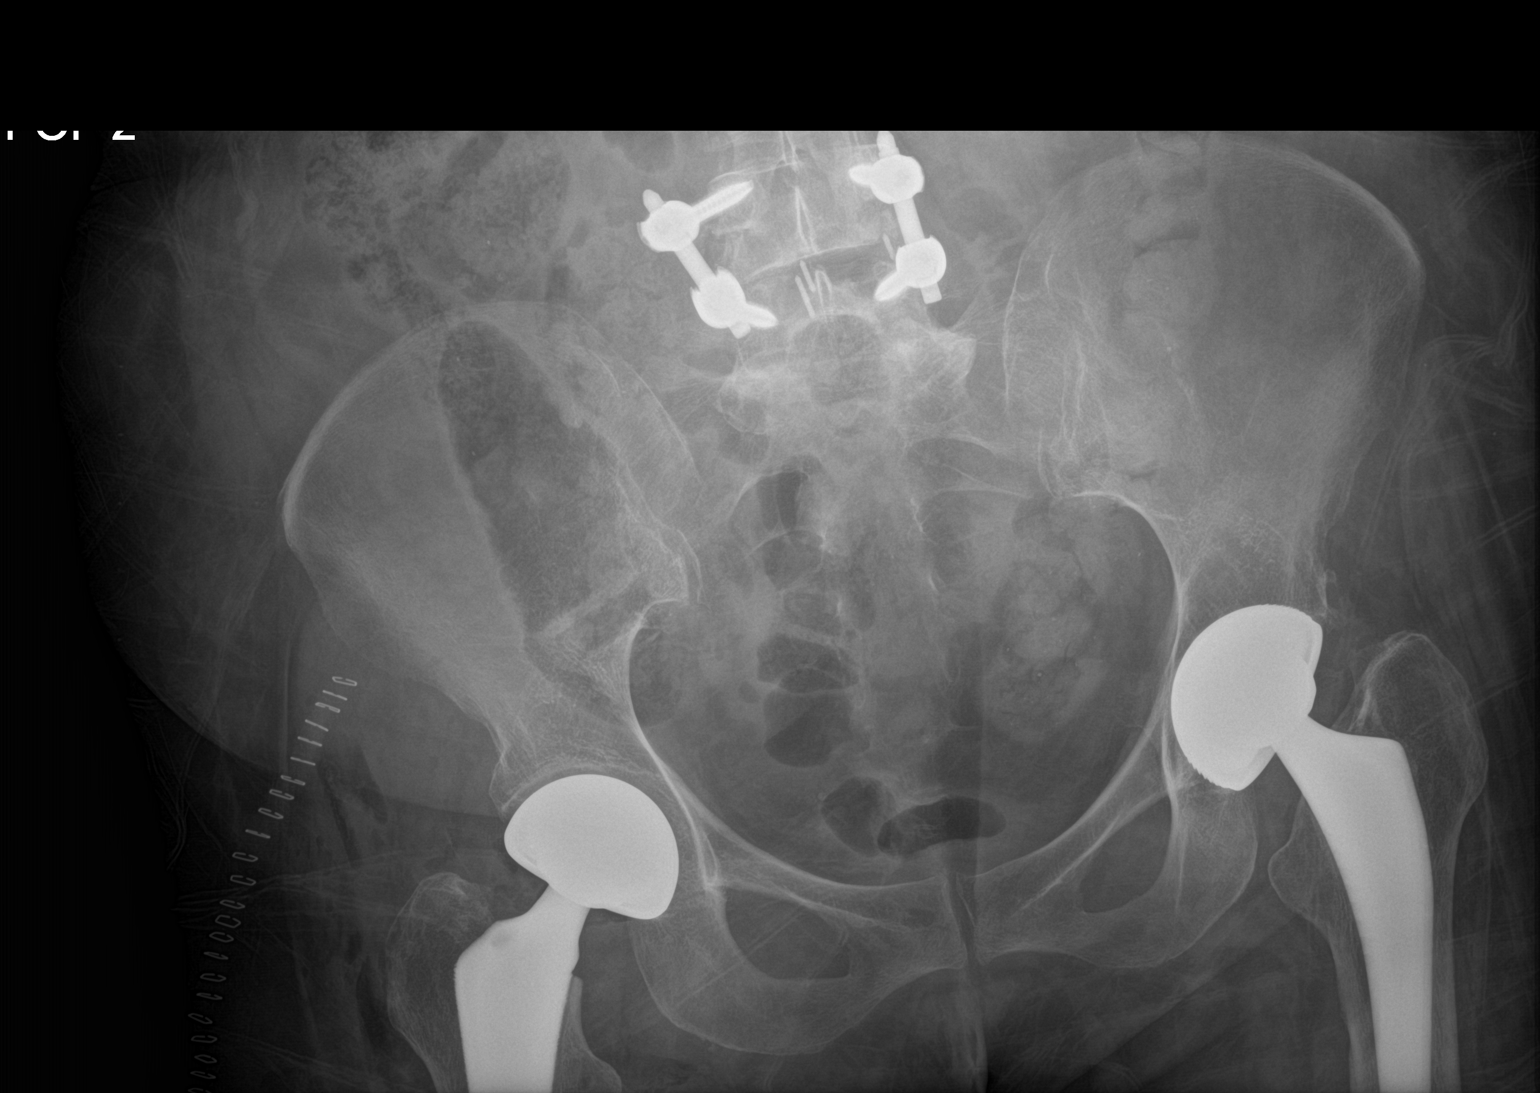
[im 2/2]
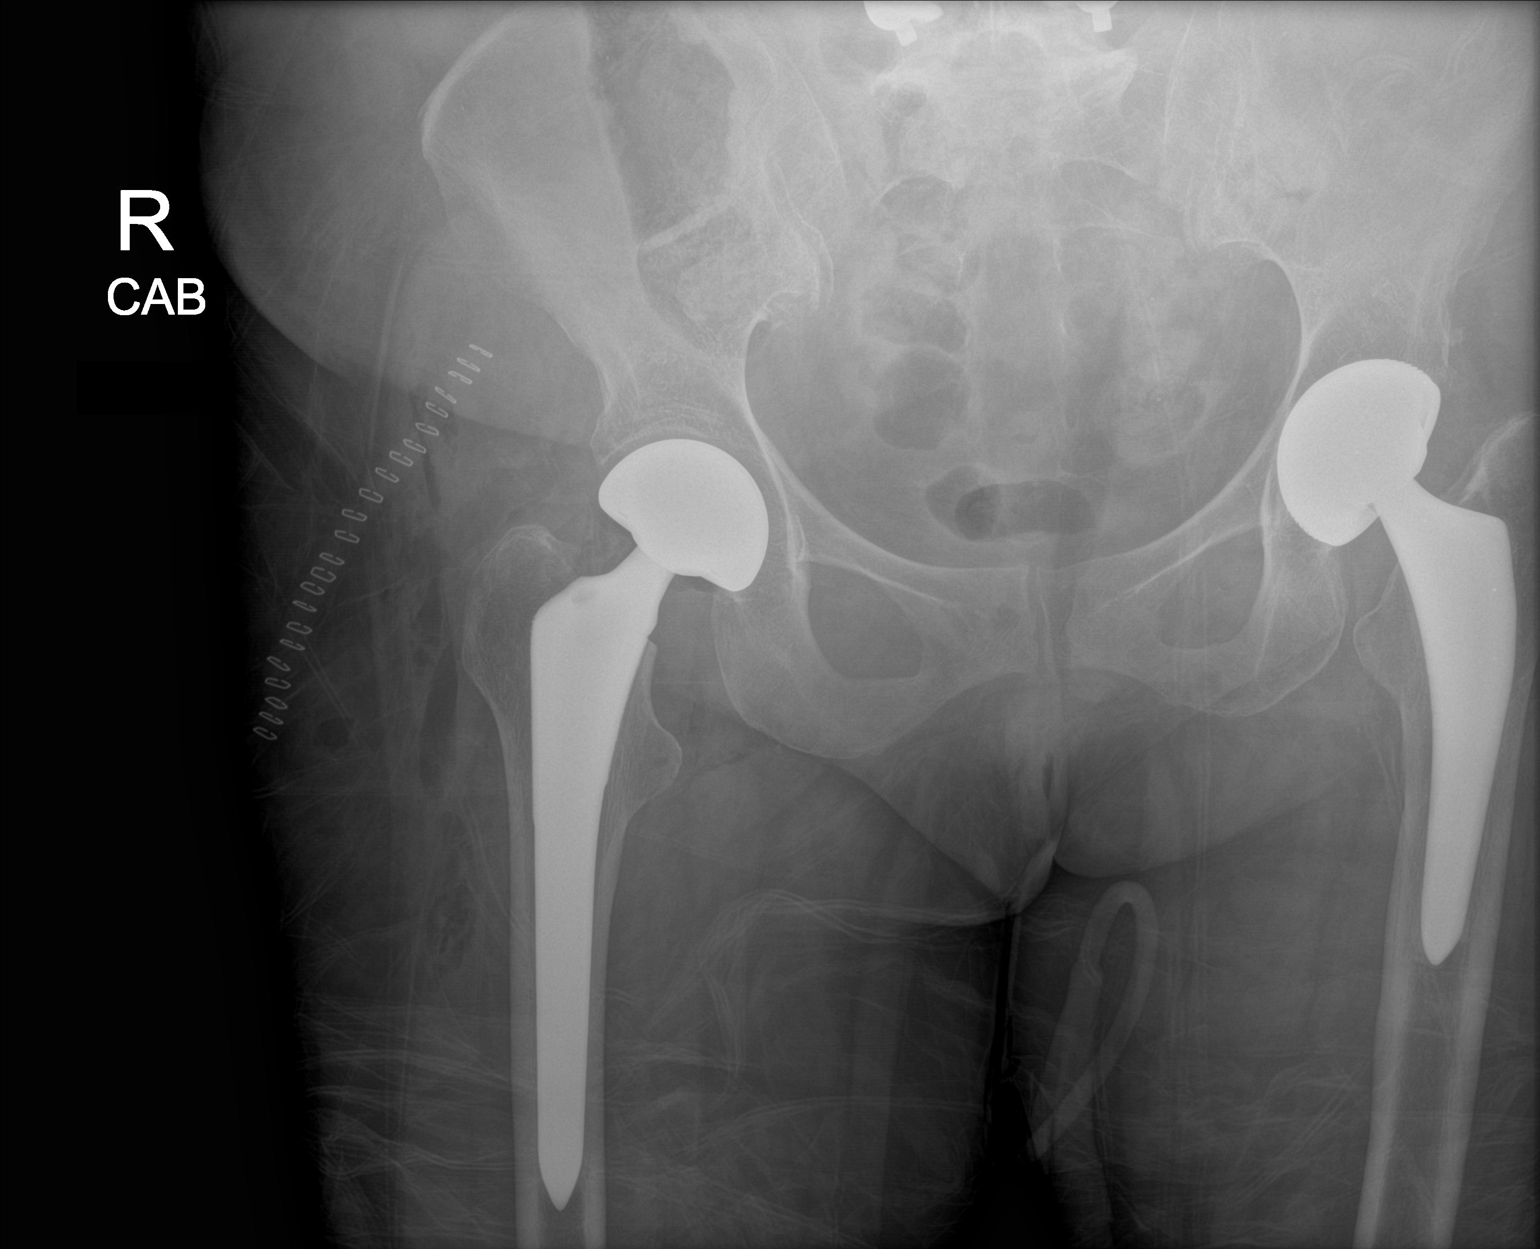

[hip lat]
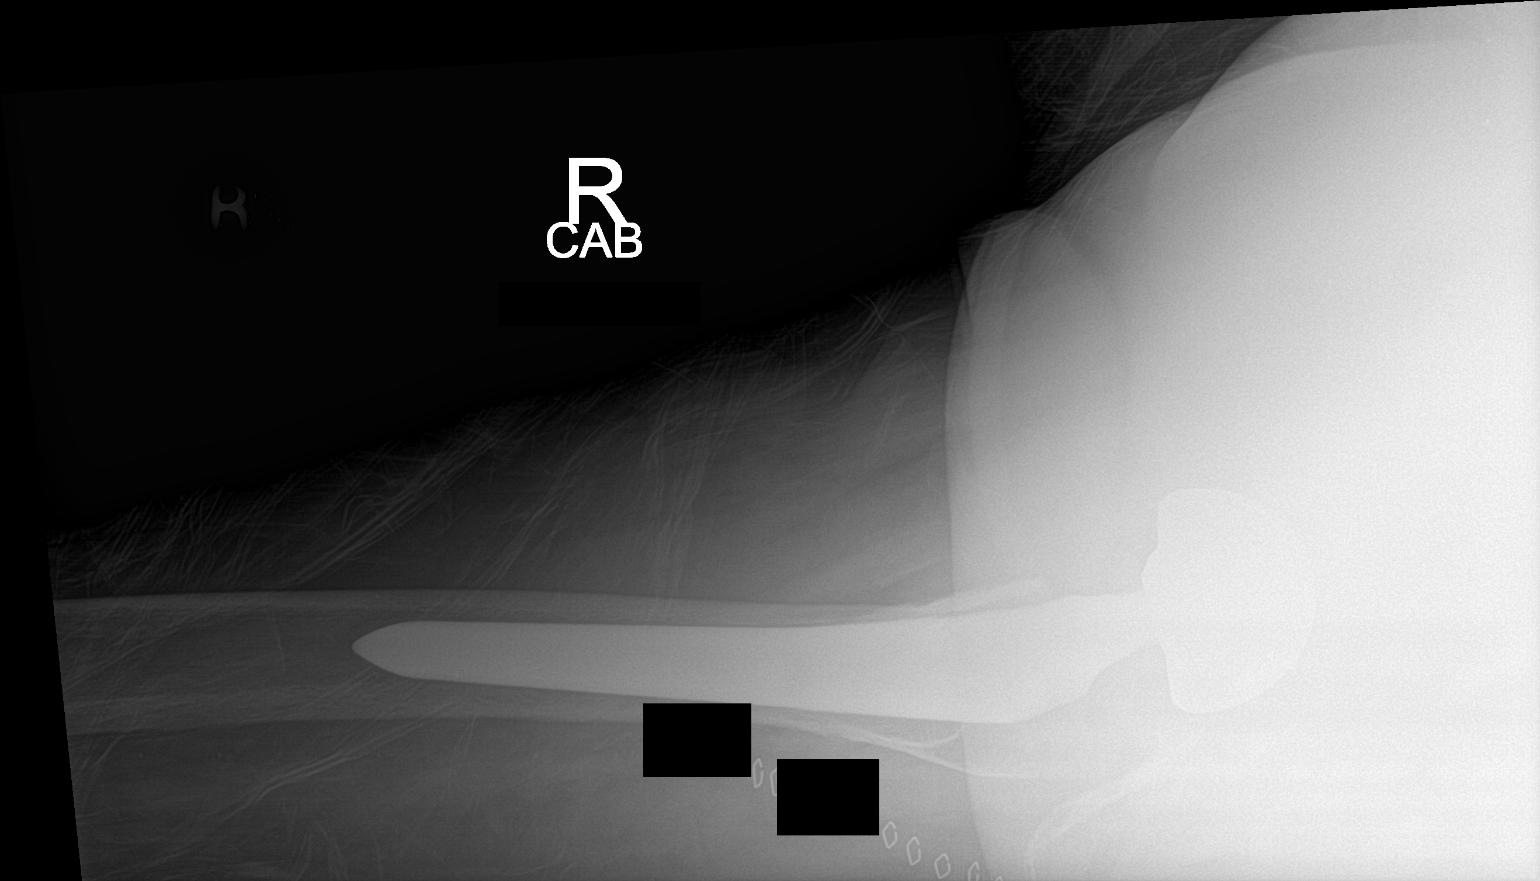

[3 of 3 positions shown; findings below may reference images not displayed]

FINDINGS: Interval right hip bipolar prosthesis in satisfactory position and
alignment. No fracture or dislocation seen. Stable left hip
prosthesis, diffuse osteopenia and lower lumbar spine fixation
hardware.
IMPRESSION: Satisfactory postoperative appearance of a right hip prosthesis.

## 2017-11-01 NOTE — Progress Notes (Signed)
No flu in [place]

## 2017-11-03 MED ORDER — VANCOMYCIN HCL IN DEXTROSE 1-5 GM/200ML-% IV SOLN
1000.0000 mg | Freq: Once | INTRAVENOUS | Status: AC
  Administered 2017-11-04: 1000 mg via INTRAVENOUS

## 2017-11-04 ENCOUNTER — Inpatient Hospital Stay: Payer: Medicare Other | Admitting: Anesthesiology

## 2017-11-04 ENCOUNTER — Encounter: Payer: Self-pay | Admitting: *Deleted

## 2017-11-04 ENCOUNTER — Other Ambulatory Visit: Payer: Self-pay

## 2017-11-04 ENCOUNTER — Inpatient Hospital Stay: Admit: 2017-11-04 | Payer: Medicare Other | Admitting: Surgery

## 2017-11-04 ENCOUNTER — Inpatient Hospital Stay
Admission: RE | Admit: 2017-11-04 | Discharge: 2017-11-05 | DRG: 483 | Disposition: A | Discharge status: Home Health | Payer: Medicare Other | Attending: Surgery | Admitting: Surgery

## 2017-11-04 ENCOUNTER — Inpatient Hospital Stay: Payer: Medicare Other

## 2017-11-04 ENCOUNTER — Encounter
Admission: RE | Disposition: A | Discharge status: Home Health | Payer: Self-pay | Source: Home / Self Care | Attending: Surgery

## 2017-11-04 DIAGNOSIS — Z823 Family history of stroke: Secondary | ICD-10-CM

## 2017-11-04 DIAGNOSIS — Z96611 Presence of right artificial shoulder joint: Secondary | ICD-10-CM

## 2017-11-04 DIAGNOSIS — Z9089 Acquired absence of other organs: Secondary | ICD-10-CM | POA: Diagnosis not present

## 2017-11-04 DIAGNOSIS — Z96643 Presence of artificial hip joint, bilateral: Secondary | ICD-10-CM | POA: Diagnosis present

## 2017-11-04 DIAGNOSIS — H3530 Unspecified macular degeneration: Secondary | ICD-10-CM | POA: Diagnosis present

## 2017-11-04 DIAGNOSIS — J449 Chronic obstructive pulmonary disease, unspecified: Secondary | ICD-10-CM | POA: Diagnosis present

## 2017-11-04 DIAGNOSIS — Z79890 Hormone replacement therapy (postmenopausal): Secondary | ICD-10-CM

## 2017-11-04 DIAGNOSIS — T380X5A Adverse effect of glucocorticoids and synthetic analogues, initial encounter: Secondary | ICD-10-CM | POA: Diagnosis present

## 2017-11-04 DIAGNOSIS — Z905 Acquired absence of kidney: Secondary | ICD-10-CM | POA: Diagnosis not present

## 2017-11-04 DIAGNOSIS — I083 Combined rheumatic disorders of mitral, aortic and tricuspid valves: Secondary | ICD-10-CM | POA: Diagnosis present

## 2017-11-04 DIAGNOSIS — Z818 Family history of other mental and behavioral disorders: Secondary | ICD-10-CM

## 2017-11-04 DIAGNOSIS — I10 Essential (primary) hypertension: Secondary | ICD-10-CM | POA: Diagnosis present

## 2017-11-04 DIAGNOSIS — Z887 Allergy status to serum and vaccine status: Secondary | ICD-10-CM

## 2017-11-04 DIAGNOSIS — E1142 Type 2 diabetes mellitus with diabetic polyneuropathy: Secondary | ICD-10-CM | POA: Diagnosis present

## 2017-11-04 DIAGNOSIS — Z9851 Tubal ligation status: Secondary | ICD-10-CM

## 2017-11-04 DIAGNOSIS — M069 Rheumatoid arthritis, unspecified: Secondary | ICD-10-CM | POA: Diagnosis present

## 2017-11-04 DIAGNOSIS — Z9049 Acquired absence of other specified parts of digestive tract: Secondary | ICD-10-CM

## 2017-11-04 DIAGNOSIS — M75111 Incomplete rotator cuff tear or rupture of right shoulder, not specified as traumatic: Secondary | ICD-10-CM | POA: Diagnosis present

## 2017-11-04 DIAGNOSIS — Z833 Family history of diabetes mellitus: Secondary | ICD-10-CM

## 2017-11-04 DIAGNOSIS — Z91048 Other nonmedicinal substance allergy status: Secondary | ICD-10-CM

## 2017-11-04 DIAGNOSIS — Z9841 Cataract extraction status, right eye: Secondary | ICD-10-CM

## 2017-11-04 DIAGNOSIS — F329 Major depressive disorder, single episode, unspecified: Secondary | ICD-10-CM | POA: Diagnosis present

## 2017-11-04 DIAGNOSIS — Z981 Arthrodesis status: Secondary | ICD-10-CM | POA: Diagnosis not present

## 2017-11-04 DIAGNOSIS — Z79899 Other long term (current) drug therapy: Secondary | ICD-10-CM

## 2017-11-04 DIAGNOSIS — Z9842 Cataract extraction status, left eye: Secondary | ICD-10-CM

## 2017-11-04 DIAGNOSIS — Z96651 Presence of right artificial knee joint: Secondary | ICD-10-CM | POA: Diagnosis present

## 2017-11-04 DIAGNOSIS — G4733 Obstructive sleep apnea (adult) (pediatric): Secondary | ICD-10-CM | POA: Diagnosis present

## 2017-11-04 DIAGNOSIS — Z888 Allergy status to other drugs, medicaments and biological substances status: Secondary | ICD-10-CM

## 2017-11-04 DIAGNOSIS — Z86010 Personal history of colonic polyps: Secondary | ICD-10-CM

## 2017-11-04 DIAGNOSIS — M87111 Osteonecrosis due to drugs, right shoulder: Secondary | ICD-10-CM | POA: Diagnosis present

## 2017-11-04 DIAGNOSIS — K219 Gastro-esophageal reflux disease without esophagitis: Secondary | ICD-10-CM | POA: Diagnosis present

## 2017-11-04 DIAGNOSIS — I2510 Atherosclerotic heart disease of native coronary artery without angina pectoris: Secondary | ICD-10-CM | POA: Diagnosis present

## 2017-11-04 DIAGNOSIS — E785 Hyperlipidemia, unspecified: Secondary | ICD-10-CM | POA: Diagnosis present

## 2017-11-04 DIAGNOSIS — Z801 Family history of malignant neoplasm of trachea, bronchus and lung: Secondary | ICD-10-CM

## 2017-11-04 DIAGNOSIS — Z8249 Family history of ischemic heart disease and other diseases of the circulatory system: Secondary | ICD-10-CM

## 2017-11-04 DIAGNOSIS — M25519 Pain in unspecified shoulder: Secondary | ICD-10-CM

## 2017-11-04 DIAGNOSIS — Z885 Allergy status to narcotic agent status: Secondary | ICD-10-CM

## 2017-11-04 DIAGNOSIS — G8929 Other chronic pain: Secondary | ICD-10-CM

## 2017-11-04 DIAGNOSIS — Z882 Allergy status to sulfonamides status: Secondary | ICD-10-CM

## 2017-11-04 DIAGNOSIS — Z961 Presence of intraocular lens: Secondary | ICD-10-CM | POA: Diagnosis present

## 2017-11-04 DIAGNOSIS — Z886 Allergy status to analgesic agent status: Secondary | ICD-10-CM

## 2017-11-04 DIAGNOSIS — Z803 Family history of malignant neoplasm of breast: Secondary | ICD-10-CM

## 2017-11-04 DIAGNOSIS — Z8711 Personal history of peptic ulcer disease: Secondary | ICD-10-CM | POA: Diagnosis not present

## 2017-11-04 DIAGNOSIS — Z881 Allergy status to other antibiotic agents status: Secondary | ICD-10-CM

## 2017-11-04 DIAGNOSIS — Z91041 Radiographic dye allergy status: Secondary | ICD-10-CM

## 2017-11-04 DIAGNOSIS — Z7951 Long term (current) use of inhaled steroids: Secondary | ICD-10-CM

## 2017-11-04 HISTORY — DX: Presence of right artificial shoulder joint: Z96.611

## 2017-11-04 HISTORY — PX: REVERSE SHOULDER ARTHROPLASTY: SHX5054

## 2017-11-04 SURGERY — ARTHROPLASTY, SHOULDER, TOTAL, REVERSE
Anesthesia: Choice | Laterality: Right

## 2017-11-04 SURGERY — ARTHROPLASTY, SHOULDER, TOTAL, REVERSE
Anesthesia: General | Site: Shoulder | Laterality: Right

## 2017-11-04 MED ORDER — GABAPENTIN 300 MG PO CAPS
600.0000 mg | ORAL_CAPSULE | Freq: Two times a day (BID) | ORAL | Status: DC
  Administered 2017-11-04 – 2017-11-05 (×2): 600 mg via ORAL
  Filled 2017-11-04 (×2): qty 2

## 2017-11-04 MED ORDER — SODIUM CHLORIDE 0.9 % IJ SOLN
INTRAMUSCULAR | Status: AC
  Filled 2017-11-04: qty 50

## 2017-11-04 MED ORDER — LORAZEPAM 0.5 MG PO TABS: 0.2500 mg | ORAL_TABLET | Freq: Every evening | ORAL | Status: DC | PRN

## 2017-11-04 MED ORDER — MIDAZOLAM HCL 2 MG/2ML IJ SOLN
INTRAMUSCULAR | Status: DC | PRN
  Administered 2017-11-04 (×2): 1 mg via INTRAVENOUS

## 2017-11-04 MED ORDER — OXYCODONE HCL 5 MG PO TABS
5.0000 mg | ORAL_TABLET | ORAL | Status: DC | PRN
  Administered 2017-11-04: 5 mg via ORAL
  Administered 2017-11-05: 10 mg via ORAL
  Administered 2017-11-05: 5 mg via ORAL
  Administered 2017-11-05: 10 mg via ORAL
  Filled 2017-11-04 (×3): qty 1
  Filled 2017-11-04: qty 2
  Filled 2017-11-04: qty 1

## 2017-11-04 MED ORDER — LACTATED RINGERS IV SOLN
INTRAVENOUS | Status: DC
  Administered 2017-11-04: 07:00:00 via INTRAVENOUS

## 2017-11-04 MED ORDER — FLUTICASONE FUROATE-VILANTEROL 100-25 MCG/INH IN AEPB
1.0000 | INHALATION_SPRAY | Freq: Every day | RESPIRATORY_TRACT | Status: DC
  Administered 2017-11-05: 1 via RESPIRATORY_TRACT
  Filled 2017-11-04: qty 28

## 2017-11-04 MED ORDER — MIRABEGRON ER 50 MG PO TB24
50.0000 mg | ORAL_TABLET | Freq: Every day | ORAL | Status: DC
  Administered 2017-11-05: 50 mg via ORAL
  Filled 2017-11-04 (×2): qty 1

## 2017-11-04 MED ORDER — TRANEXAMIC ACID 1000 MG/10ML IV SOLN
INTRAVENOUS | Status: AC
  Filled 2017-11-04: qty 10

## 2017-11-04 MED ORDER — VITAMIN D (ERGOCALCIFEROL) 1.25 MG (50000 UNIT) PO CAPS
50000.0000 [IU] | ORAL_CAPSULE | ORAL | Status: DC
  Filled 2017-11-04: qty 1

## 2017-11-04 MED ORDER — TRANEXAMIC ACID 1000 MG/10ML IV SOLN
INTRAVENOUS | Status: DC | PRN
  Administered 2017-11-04: 1000 mg via INTRAVENOUS

## 2017-11-04 MED ORDER — MONTELUKAST SODIUM 10 MG PO TABS
10.0000 mg | ORAL_TABLET | Freq: Every day | ORAL | Status: DC
  Administered 2017-11-05: 10 mg via ORAL
  Filled 2017-11-04: qty 1

## 2017-11-04 MED ORDER — FLEET ENEMA 7-19 GM/118ML RE ENEM: 1.0000 | ENEMA | Freq: Once | RECTAL | Status: DC | PRN

## 2017-11-04 MED ORDER — LIDOCAINE HCL (CARDIAC) PF 100 MG/5ML IV SOSY
PREFILLED_SYRINGE | INTRAVENOUS | Status: DC | PRN
  Administered 2017-11-04: 30 mg via INTRAVENOUS

## 2017-11-04 MED ORDER — LAMOTRIGINE 25 MG PO TABS
100.0000 mg | ORAL_TABLET | Freq: Two times a day (BID) | ORAL | Status: DC
  Administered 2017-11-04 – 2017-11-05 (×2): 100 mg via ORAL
  Filled 2017-11-04 (×2): qty 4

## 2017-11-04 MED ORDER — METOCLOPRAMIDE HCL 10 MG PO TABS: 5.0000 mg | ORAL_TABLET | Freq: Three times a day (TID) | ORAL | Status: DC | PRN

## 2017-11-04 MED ORDER — DIPHENHYDRAMINE HCL 12.5 MG/5ML PO ELIX: 12.5000 mg | ORAL_SOLUTION | ORAL | Status: DC | PRN

## 2017-11-04 MED ORDER — TRAMADOL HCL 50 MG PO TABS
50.0000 mg | ORAL_TABLET | Freq: Four times a day (QID) | ORAL | Status: DC | PRN
  Administered 2017-11-04 – 2017-11-05 (×2): 50 mg via ORAL
  Filled 2017-11-04 (×2): qty 1

## 2017-11-04 MED ORDER — BUPIVACAINE-EPINEPHRINE (PF) 0.5% -1:200000 IJ SOLN
INTRAMUSCULAR | Status: DC | PRN
  Administered 2017-11-04: 30 mL via PERINEURAL

## 2017-11-04 MED ORDER — BUPIVACAINE-EPINEPHRINE (PF) 0.5% -1:200000 IJ SOLN
INTRAMUSCULAR | Status: AC
  Filled 2017-11-04: qty 30

## 2017-11-04 MED ORDER — VANCOMYCIN HCL IN DEXTROSE 1-5 GM/200ML-% IV SOLN
INTRAVENOUS | Status: AC
  Administered 2017-11-04: 1000 mg via INTRAVENOUS
  Filled 2017-11-04: qty 200

## 2017-11-04 MED ORDER — LEFLUNOMIDE 20 MG PO TABS
20.0000 mg | ORAL_TABLET | Freq: Every day | ORAL | Status: DC
  Administered 2017-11-05: 20 mg via ORAL
  Filled 2017-11-04: qty 1

## 2017-11-04 MED ORDER — MUPIROCIN 2 % EX OINT
1.0000 "application " | TOPICAL_OINTMENT | Freq: Two times a day (BID) | CUTANEOUS | Status: DC
  Administered 2017-11-04: 1 via TOPICAL
  Filled 2017-11-04: qty 22

## 2017-11-04 MED ORDER — FENTANYL CITRATE (PF) 100 MCG/2ML IJ SOLN
INTRAMUSCULAR | Status: AC
  Administered 2017-11-04: 25 ug via INTRAVENOUS
  Filled 2017-11-04: qty 2

## 2017-11-04 MED ORDER — METOCLOPRAMIDE HCL 5 MG/ML IJ SOLN: 5.0000 mg | Freq: Three times a day (TID) | INTRAMUSCULAR | Status: DC | PRN

## 2017-11-04 MED ORDER — DEXAMETHASONE SODIUM PHOSPHATE 10 MG/ML IJ SOLN
INTRAMUSCULAR | Status: DC | PRN
  Administered 2017-11-04: 5 mg via INTRAVENOUS

## 2017-11-04 MED ORDER — ROCURONIUM BROMIDE 100 MG/10ML IV SOLN
INTRAVENOUS | Status: DC | PRN
  Administered 2017-11-04: 50 mg via INTRAVENOUS

## 2017-11-04 MED ORDER — OCUVITE-LUTEIN PO CAPS
1.0000 | ORAL_CAPSULE | Freq: Two times a day (BID) | ORAL | Status: DC
  Administered 2017-11-04 – 2017-11-05 (×2): 1 via ORAL
  Filled 2017-11-04 (×4): qty 1

## 2017-11-04 MED ORDER — SODIUM CHLORIDE 0.9 % IV SOLN
INTRAVENOUS | Status: DC | PRN
  Administered 2017-11-04: 60 mL

## 2017-11-04 MED ORDER — MIDAZOLAM HCL 2 MG/2ML IJ SOLN
INTRAMUSCULAR | Status: AC
  Filled 2017-11-04: qty 2

## 2017-11-04 MED ORDER — FENTANYL CITRATE (PF) 100 MCG/2ML IJ SOLN
INTRAMUSCULAR | Status: AC
  Filled 2017-11-04: qty 2

## 2017-11-04 MED ORDER — PROPOFOL 10 MG/ML IV BOLUS
INTRAVENOUS | Status: AC
  Filled 2017-11-04: qty 40

## 2017-11-04 MED ORDER — BISACODYL 10 MG RE SUPP: 10.0000 mg | Freq: Every day | RECTAL | Status: DC | PRN

## 2017-11-04 MED ORDER — CLOTRIMAZOLE 1 % EX CREA
1.0000 "application " | TOPICAL_CREAM | Freq: Two times a day (BID) | CUTANEOUS | Status: DC | PRN
  Filled 2017-11-04: qty 15

## 2017-11-04 MED ORDER — VANCOMYCIN HCL 1000 MG IV SOLR: INTRAVENOUS | Status: DC | PRN

## 2017-11-04 MED ORDER — ONDANSETRON HCL 4 MG/2ML IJ SOLN: 4.0000 mg | Freq: Four times a day (QID) | INTRAMUSCULAR | Status: DC | PRN

## 2017-11-04 MED ORDER — PRAVASTATIN SODIUM 20 MG PO TABS
20.0000 mg | ORAL_TABLET | Freq: Every day | ORAL | Status: DC
  Administered 2017-11-04: 20 mg via ORAL
  Filled 2017-11-04: qty 1

## 2017-11-04 MED ORDER — ONDANSETRON HCL 4 MG/2ML IJ SOLN: 4.0000 mg | Freq: Once | INTRAMUSCULAR | Status: DC | PRN

## 2017-11-04 MED ORDER — SUCRALFATE 1 G PO TABS
1.0000 g | ORAL_TABLET | Freq: Two times a day (BID) | ORAL | Status: DC
  Administered 2017-11-04 – 2017-11-05 (×2): 1 g via ORAL
  Filled 2017-11-04 (×2): qty 1

## 2017-11-04 MED ORDER — ACETAMINOPHEN 325 MG PO TABS: 325.0000 mg | ORAL_TABLET | Freq: Four times a day (QID) | ORAL | Status: DC | PRN

## 2017-11-04 MED ORDER — HYDROMORPHONE HCL 1 MG/ML IJ SOLN: 0.5000 mg | INTRAMUSCULAR | Status: DC | PRN

## 2017-11-04 MED ORDER — FOLIC ACID 1 MG PO TABS
1.0000 mg | ORAL_TABLET | Freq: Every day | ORAL | Status: DC
  Filled 2017-11-04: qty 1

## 2017-11-04 MED ORDER — ENOXAPARIN SODIUM 40 MG/0.4ML ~~LOC~~ SOLN
40.0000 mg | SUBCUTANEOUS | Status: DC
  Administered 2017-11-05: 40 mg via SUBCUTANEOUS

## 2017-11-04 MED ORDER — TERIPARATIDE (RECOMBINANT) 600 MCG/2.4ML ~~LOC~~ SOLN: 0.0800 mL | Freq: Every day | SUBCUTANEOUS | Status: DC

## 2017-11-04 MED ORDER — SUGAMMADEX SODIUM 200 MG/2ML IV SOLN
INTRAVENOUS | Status: DC | PRN
  Administered 2017-11-04: 100 mg via INTRAVENOUS

## 2017-11-04 MED ORDER — NEOMYCIN-POLYMYXIN B GU 40-200000 IR SOLN
Status: DC | PRN
  Administered 2017-11-04: 14 mL

## 2017-11-04 MED ORDER — SODIUM CHLORIDE 0.9 % IV SOLN
INTRAVENOUS | Status: DC
  Administered 2017-11-04 – 2017-11-05 (×2): via INTRAVENOUS

## 2017-11-04 MED ORDER — ACETAMINOPHEN 500 MG PO TABS
1000.0000 mg | ORAL_TABLET | Freq: Four times a day (QID) | ORAL | Status: AC
  Administered 2017-11-04 (×2): 1000 mg via ORAL
  Filled 2017-11-04 (×3): qty 2

## 2017-11-04 MED ORDER — VANCOMYCIN HCL IN DEXTROSE 1-5 GM/200ML-% IV SOLN
1000.0000 mg | Freq: Two times a day (BID) | INTRAVENOUS | Status: AC
  Administered 2017-11-04: 1000 mg via INTRAVENOUS
  Filled 2017-11-04: qty 200

## 2017-11-04 MED ORDER — QUETIAPINE FUMARATE 25 MG PO TABS
25.0000 mg | ORAL_TABLET | Freq: Every day | ORAL | Status: DC
  Administered 2017-11-04: 25 mg via ORAL
  Filled 2017-11-04: qty 1

## 2017-11-04 MED ORDER — MELATONIN 5 MG PO TABS
10.0000 mg | ORAL_TABLET | Freq: Every day | ORAL | Status: DC
  Administered 2017-11-04: 10 mg via ORAL
  Filled 2017-11-04 (×2): qty 2

## 2017-11-04 MED ORDER — PROPOFOL 10 MG/ML IV BOLUS
INTRAVENOUS | Status: DC | PRN
  Administered 2017-11-04: 120 mg via INTRAVENOUS

## 2017-11-04 MED ORDER — ONDANSETRON HCL 4 MG PO TABS: 4.0000 mg | ORAL_TABLET | Freq: Four times a day (QID) | ORAL | Status: DC | PRN

## 2017-11-04 MED ORDER — SODIUM CHLORIDE FLUSH 0.9 % IV SOLN
INTRAVENOUS | Status: AC
  Filled 2017-11-04: qty 10

## 2017-11-04 MED ORDER — ALBUTEROL SULFATE HFA 108 (90 BASE) MCG/ACT IN AERS: 2.0000 | INHALATION_SPRAY | Freq: Four times a day (QID) | RESPIRATORY_TRACT | Status: DC | PRN

## 2017-11-04 MED ORDER — ALBUTEROL SULFATE (2.5 MG/3ML) 0.083% IN NEBU: 2.5000 mg | INHALATION_SOLUTION | Freq: Four times a day (QID) | RESPIRATORY_TRACT | Status: DC | PRN

## 2017-11-04 MED ORDER — PANTOPRAZOLE SODIUM 40 MG PO TBEC
40.0000 mg | DELAYED_RELEASE_TABLET | Freq: Every day | ORAL | Status: DC
  Administered 2017-11-05: 40 mg via ORAL
  Filled 2017-11-04: qty 1

## 2017-11-04 MED ORDER — NEBIVOLOL HCL 5 MG PO TABS
5.0000 mg | ORAL_TABLET | Freq: Every day | ORAL | Status: DC
  Administered 2017-11-05: 5 mg via ORAL
  Filled 2017-11-04: qty 1

## 2017-11-04 MED ORDER — DOCUSATE SODIUM 100 MG PO CAPS
100.0000 mg | ORAL_CAPSULE | Freq: Two times a day (BID) | ORAL | Status: DC
  Administered 2017-11-04 – 2017-11-05 (×2): 100 mg via ORAL
  Filled 2017-11-04 (×2): qty 1

## 2017-11-04 MED ORDER — NEOMYCIN-POLYMYXIN B GU 40-200000 IR SOLN
Status: AC
  Filled 2017-11-04: qty 20

## 2017-11-04 MED ORDER — MAGNESIUM HYDROXIDE 400 MG/5ML PO SUSP: 30.0000 mL | Freq: Every day | ORAL | Status: DC | PRN

## 2017-11-04 MED ORDER — EPHEDRINE SULFATE 50 MG/ML IJ SOLN
INTRAMUSCULAR | Status: DC | PRN
  Administered 2017-11-04: 10 mg via INTRAVENOUS

## 2017-11-04 MED ORDER — ONDANSETRON HCL 4 MG/2ML IJ SOLN
INTRAMUSCULAR | Status: DC | PRN
  Administered 2017-11-04: 4 mg via INTRAVENOUS

## 2017-11-04 MED ORDER — ESCITALOPRAM OXALATE 10 MG PO TABS
10.0000 mg | ORAL_TABLET | Freq: Every day | ORAL | Status: DC
  Administered 2017-11-05: 10 mg via ORAL
  Filled 2017-11-04: qty 1

## 2017-11-04 MED ORDER — FENTANYL CITRATE (PF) 100 MCG/2ML IJ SOLN
INTRAMUSCULAR | Status: DC | PRN
  Administered 2017-11-04 (×3): 50 ug via INTRAVENOUS

## 2017-11-04 MED ORDER — BUPIVACAINE LIPOSOME 1.3 % IJ SUSP
INTRAMUSCULAR | Status: AC
  Filled 2017-11-04: qty 20

## 2017-11-04 MED ORDER — FENTANYL CITRATE (PF) 100 MCG/2ML IJ SOLN
25.0000 ug | INTRAMUSCULAR | Status: DC | PRN
  Administered 2017-11-04 (×3): 25 ug via INTRAVENOUS

## 2017-11-04 SURGICAL SUPPLY — 64 items
BASEPLATE GLENOSPHERE 25 (Plate) ×2 IMPLANT
BEARING HUMERAL SHLDER 36M STD (Shoulder) ×1 IMPLANT
BIT DRILL TWIST 2.7 (BIT) ×2 IMPLANT
BLADE SAGITTAL WIDE XTHICK NO (BLADE) ×2 IMPLANT
CANISTER SUCT 1200ML W/VALVE (MISCELLANEOUS) ×2 IMPLANT
CANISTER SUCT 3000ML PPV (MISCELLANEOUS) ×4 IMPLANT
CHLORAPREP W/TINT 26ML (MISCELLANEOUS) ×2 IMPLANT
COOLER POLAR GLACIER W/PUMP (MISCELLANEOUS) ×2 IMPLANT
CRADLE LAMINECT ARM (MISCELLANEOUS) ×2 IMPLANT
DRAPE IMP U-DRAPE 54X76 (DRAPES) ×4 IMPLANT
DRAPE INCISE IOBAN 66X45 STRL (DRAPES) ×4 IMPLANT
DRAPE SHEET LG 3/4 BI-LAMINATE (DRAPES) ×4 IMPLANT
DRAPE TABLE BACK 80X90 (DRAPES) ×2 IMPLANT
DRSG OPSITE POSTOP 4X8 (GAUZE/BANDAGES/DRESSINGS) ×2 IMPLANT
ELECT BLADE 6.5 EXT (BLADE) IMPLANT
ELECT CAUTERY BLADE 6.4 (BLADE) ×2 IMPLANT
GLENOID SPHERE STD STRL 36MM (Orthopedic Implant) ×2 IMPLANT
GLOVE BIO SURGEON STRL SZ7.5 (GLOVE) ×8 IMPLANT
GLOVE BIO SURGEON STRL SZ8 (GLOVE) ×8 IMPLANT
GLOVE BIOGEL PI IND STRL 8 (GLOVE) ×1 IMPLANT
GLOVE BIOGEL PI INDICATOR 8 (GLOVE) ×1
GLOVE INDICATOR 8.0 STRL GRN (GLOVE) ×2 IMPLANT
GOWN STRL REUS W/ TWL LRG LVL3 (GOWN DISPOSABLE) ×1 IMPLANT
GOWN STRL REUS W/ TWL XL LVL3 (GOWN DISPOSABLE) ×1 IMPLANT
GOWN STRL REUS W/TWL LRG LVL3 (GOWN DISPOSABLE) ×1
GOWN STRL REUS W/TWL XL LVL3 (GOWN DISPOSABLE) ×1
HOOD PEEL AWAY FLYTE STAYCOOL (MISCELLANEOUS) ×6 IMPLANT
KIT STABILIZATION SHOULDER (MISCELLANEOUS) ×2 IMPLANT
KIT TURNOVER KIT A (KITS) ×2 IMPLANT
MASK FACE SPIDER DISP (MASK) ×2 IMPLANT
MAT ABSORB  FLUID 56X50 GRAY (MISCELLANEOUS) ×1
MAT ABSORB FLUID 56X50 GRAY (MISCELLANEOUS) ×1 IMPLANT
NDL SAFETY ECLIPSE 18X1.5 (NEEDLE) ×1 IMPLANT
NEEDLE HYPO 18GX1.5 SHARP (NEEDLE) ×1
NEEDLE HYPO 22GX1.5 SAFETY (NEEDLE) ×2 IMPLANT
NEEDLE SPNL 20GX3.5 QUINCKE YW (NEEDLE) ×2 IMPLANT
NS IRRIG 500ML POUR BTL (IV SOLUTION) ×2 IMPLANT
PACK ARTHROSCOPY SHOULDER (MISCELLANEOUS) ×2 IMPLANT
PAD WRAPON POLAR SHDR UNIV (MISCELLANEOUS) ×1 IMPLANT
PIN THREADED REVERSE (PIN) ×2 IMPLANT
PULSAVAC PLUS IRRIG FAN TIP (DISPOSABLE) ×2
SCREW BONE LOCKING 4.75X35X3.5 (Screw) ×4 IMPLANT
SCREW BONE STRL 6.5MMX30MM (Screw) ×2 IMPLANT
SCREW NON-LOCK 4.75MMX15MM (Screw) ×2 IMPLANT
SCREW NON-LOCK 4.75X20X3.5 (Screw) ×2 IMPLANT
SHOULDER HUMERAL BEAR 36M STD (Shoulder) ×2 IMPLANT
SLING ULTRA II M (MISCELLANEOUS) ×2 IMPLANT
SOL .9 NS 3000ML IRR  AL (IV SOLUTION) ×1
SOL .9 NS 3000ML IRR UROMATIC (IV SOLUTION) ×1 IMPLANT
SPONGE LAP 18X18 RF (DISPOSABLE) IMPLANT
STAPLER SKIN PROX 35W (STAPLE) ×2 IMPLANT
STEM HUMERAL STRL 10MMX55MM (Stem) ×2 IMPLANT
SUT ETHIBOND 0 MO6 C/R (SUTURE) ×2 IMPLANT
SUT FIBERWIRE #2 38 BLUE 1/2 (SUTURE) ×4
SUT VIC AB 0 CT1 36 (SUTURE) ×2 IMPLANT
SUT VIC AB 2-0 CT1 27 (SUTURE) ×2
SUT VIC AB 2-0 CT1 TAPERPNT 27 (SUTURE) ×2 IMPLANT
SUTURE FIBERWR #2 38 BLUE 1/2 (SUTURE) ×2 IMPLANT
SYR 10ML LL (SYRINGE) ×2 IMPLANT
SYR 30ML LL (SYRINGE) IMPLANT
TIP FAN IRRIG PULSAVAC PLUS (DISPOSABLE) ×1 IMPLANT
TRAY FOLEY MTR SLVR 16FR STAT (SET/KITS/TRAYS/PACK) IMPLANT
TRAY HUM REV SHOULDER STD +6 (Shoulder) ×2 IMPLANT
WRAPON POLAR PAD SHDR UNIV (MISCELLANEOUS) ×2

## 2017-11-04 NOTE — Anesthesia Post-op Follow-up Note (Signed)
Anesthesia QCDR form completed.        

## 2017-11-04 NOTE — Progress Notes (Signed)
OT Cancellation Note  Patient Details Name: Kerri Carter MRN: [id number] DOB: 11-24-1940   Cancelled Treatment:    Reason Eval/Treat Not Completed: Fatigue/lethargy limiting ability to participate;Patient's level of consciousness. Order received, chart reviewed. Pt asleep upon arrival, spouse sleeping in recliner beside pt's bed. Pt woke briefly to OT's voice but never actually opened her eyes. Pt declines therapy 2/2 fatigue. Handout left on tray table. Will re-attempt OT evaluation next date as pt is medically appropriate and able to tolerate.   Jeni Salles, MPH, MS, OTR/L ascom [phone] 11/04/17, 2:49 PM

## 2017-11-04 NOTE — NC FL2 (Signed)
[place] LEVEL OF CARE SCREENING TOOL     IDENTIFICATION  Patient Name: Kerri Carter Birthdate: Dec 20, 1940 Sex: female Admission Date (Current Location): 11/04/2017  [place] and [place] Number:  Engineering geologist and Address:  [facility], [address], [place], [place] [address]      Provider Number: [id number]  Attending Physician Name and Address:  Corky Mull, MD  Relative Name and Phone Number:       Current Level of Care: Hospital Recommended Level of Care: [place] Prior Approval Number:    Date Approved/Denied:   PASRR Number: [id number])  Discharge Plan: SNF    Current Diagnoses: Patient Active Problem List   Diagnosis Date Noted  . Status post reverse total shoulder replacement, right 11/04/2017  . Leukocytosis 10/19/2017  . Allergic rhinitis 09/28/2017  . Leg swelling 12/15/2016  . Hip fracture ([place]) 10/31/2016  . Left foot pain 10/19/2016  . Spinal stenosis of lumbar region 09/15/2016  . Osteoporosis 08/27/2016  . Prediabetes 08/25/2016  . Mixed bipolar I disorder ([place]) 10/09/2015  . CKD (chronic kidney disease), stage III ([place])   . Essential hypertension   . Conversion disorder 08/04/2015  . Hyperlipidemia 07/17/2015  . 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 Macular degeneration 07/17/2015  . Insomnia 07/17/2015  . Rheumatoid arthritis ([place]) 12/05/2014  . Valvular heart disease 10/13/2011  . COPD (chronic obstructive pulmonary disease) ([place]) 09/09/2011  . OSA (obstructive sleep apnea) 09/09/2011  . Nocturnal hypoxemia 09/09/2011  . Gastroesophageal reflux disease 02/24/2011  . Single kidney 02/24/2011    Orientation RESPIRATION BLADDER Height & Weight     Self, Time, Situation, Place  O2(2 Liters Oxygen. ) Continent Weight: 167 lb 3.2 oz (75.8 kg) Height:  5\' 1"  (154.9 cm)  BEHAVIORAL SYMPTOMS/MOOD NEUROLOGICAL BOWEL NUTRITION STATUS      Continent  Diet(Diet: Clear Liquid to be Advanced. )  AMBULATORY STATUS COMMUNICATION OF NEEDS Skin   Extensive Assist Verbally Surgical wounds(Incision: Right Shoulder. )                       Personal Care Assistance Level of Assistance  Bathing, Feeding, Dressing Bathing Assistance: Limited assistance Feeding assistance: Independent Dressing Assistance: Limited assistance     Functional Limitations Info  Sight, Hearing, Speech Sight Info: Adequate Hearing Info: Adequate Speech Info: Adequate    SPECIAL CARE FACTORS FREQUENCY  PT (By licensed PT), OT (By licensed OT)     PT Frequency: (5) OT Frequency: (5)            Contractures      Additional Factors Info  Code Status, Allergies Code Status Info: (Full Code. ) Allergies Info: (Ceftin Cefuroxime Axetil, Lisinopril, Morphine, Sulfasalazine, Aspirin, Antihistamines, Chlorpheniramine-type, Antivert Meclizine Hcl, Decongestant Pseudoephedrine Hcl, Doxycycline, Morphine And Related, Polymyxin B, Pseudoephedrine Hcl, Sulfa Antibiotic)           Current Medications (11/04/2017):  This is the current hospital active medication list Current Facility-Administered Medications  Medication Dose Route Frequency Provider Last Rate Last Dose  . 0.9 %  sodium chloride infusion   Intravenous Continuous Poggi, Marshall Cork, MD 75 mL/hr at 11/04/17 1303    . acetaminophen (TYLENOL) tablet 1,000 mg  1,000 mg Oral Q6H Poggi, Marshall Cork, MD   1,000 mg at 11/04/17 1302  . [START ON 11/05/2017] acetaminophen (TYLENOL) tablet 325-650 mg  325-650 mg Oral Q6H PRN Poggi, Marshall Cork, MD      .  albuterol (PROVENTIL) (2.5 MG/3ML) 0.083% nebulizer solution 2.5 mg  2.5 mg Nebulization Q6H PRN Poggi, Marshall Cork, MD      . bisacodyl (DULCOLAX) suppository 10 mg  10 mg Rectal Daily PRN Poggi, Marshall Cork, MD      . clotrimazole (LOTRIMIN) 1 % cream 1 application  1 application Topical BID PRN Poggi, Marshall Cork, MD      . diphenhydrAMINE (BENADRYL) 12.5 MG/5ML elixir 12.5-25 mg   12.5-25 mg Oral Q4H PRN Poggi, Marshall Cork, MD      . docusate sodium (COLACE) capsule 100 mg  100 mg Oral BID Poggi, Marshall Cork, MD      . Derrill Memo ON 11/05/2017] enoxaparin (LOVENOX) injection 40 mg  40 mg Subcutaneous Q24H Poggi, Marshall Cork, MD      . Derrill Memo ON 11/05/2017] escitalopram (LEXAPRO) tablet 10 mg  10 mg Oral Daily Poggi, Marshall Cork, MD      . Derrill Memo ON 11/05/2017] fluticasone furoate-vilanterol (BREO ELLIPTA) 100-25 MCG/INH 1 puff  1 puff Inhalation Daily Poggi, Marshall Cork, MD      . folic acid (FOLVITE) tablet 1 mg  1 mg Oral Daily Poggi, Marshall Cork, MD      . gabapentin (NEURONTIN) capsule 600 mg  600 mg Oral BID Poggi, Marshall Cork, MD      . HYDROmorphone (DILAUDID) injection 0.5-1 mg  0.5-1 mg Intravenous Q4H PRN Poggi, Marshall Cork, MD      . lamoTRIgine (LAMICTAL) tablet 100 mg  100 mg Oral BID Poggi, Marshall Cork, MD      . Derrill Memo ON 11/05/2017] leflunomide (ARAVA) tablet 20 mg  20 mg Oral Daily Poggi, Marshall Cork, MD      . LORazepam (ATIVAN) tablet 0.25 mg  0.25 mg Oral QHS PRN,MR X 1 Poggi, Marshall Cork, MD      . magnesium hydroxide (MILK OF MAGNESIA) suspension 30 mL  30 mL Oral Daily PRN Poggi, Marshall Cork, MD      . Melatonin TABS 10 mg  10 mg Oral QHS Poggi, Marshall Cork, MD      . metoCLOPramide (REGLAN) tablet 5-10 mg  5-10 mg Oral Q8H PRN Poggi, Marshall Cork, MD       Or  . metoCLOPramide (REGLAN) injection 5-10 mg  5-10 mg Intravenous Q8H PRN Poggi, Marshall Cork, MD      . mirabegron ER (MYRBETRIQ) tablet 50 mg  50 mg Oral Daily Poggi, Marshall Cork, MD      . Derrill Memo ON 11/05/2017] montelukast (SINGULAIR) tablet 10 mg  10 mg Oral Daily Poggi, Marshall Cork, MD      . multivitamin-lutein (OCUVITE-LUTEIN) capsule 1 capsule  1 capsule Oral BID Poggi, Marshall Cork, MD      . mupirocin ointment (BACTROBAN) 2 % 1 application  1 application Topical BID Poggi, Marshall Cork, MD      . Derrill Memo ON 11/05/2017] nebivolol (BYSTOLIC) tablet 5 mg  5 mg Oral Daily Poggi, Marshall Cork, MD      . ondansetron (ZOFRAN) tablet 4 mg  4 mg Oral Q6H PRN Poggi, Marshall Cork, MD       Or  . ondansetron  (ZOFRAN) injection 4 mg  4 mg Intravenous Q6H PRN Poggi, Marshall Cork, MD      . oxyCODONE (Oxy IR/ROXICODONE) immediate release tablet 5-10 mg  5-10 mg Oral Q4H PRN Poggi, Marshall Cork, MD      . Derrill Memo ON 11/05/2017] pantoprazole (PROTONIX) EC tablet 40 mg  40 mg Oral Daily Poggi, Marshall Cork, MD      .  pravastatin (PRAVACHOL) tablet 20 mg  20 mg Oral q1800 Poggi, Marshall Cork, MD      . QUEtiapine (SEROQUEL) tablet 25 mg  25 mg Oral QHS Poggi, Marshall Cork, MD      . sodium phosphate (FLEET) 7-19 GM/118ML enema 1 enema  1 enema Rectal Once PRN Poggi, Marshall Cork, MD      . sucralfate (CARAFATE) tablet 1 g  1 g Oral BID Poggi, Marshall Cork, MD      . Teriparatide (Recombinant) SOLN 20 mcg  0.08 mL Subcutaneous Daily Poggi, Marshall Cork, MD      . traMADol Veatrice Bourbon) tablet 50 mg  50 mg Oral Q6H PRN Poggi, Marshall Cork, MD      . vancomycin (VANCOCIN) IVPB 1000 mg/200 mL premix  1,000 mg Intravenous Q12H Poggi, Marshall Cork, MD      . Derrill Memo ON 11/06/2017] Vitamin D (Ergocalciferol) (DRISDOL) capsule 50,000 Units  50,000 Units Oral Q Sat Poggi, Marshall Cork, MD         Discharge Medications: Please see discharge summary for a list of discharge medications.  Relevant Imaging Results:  Relevant Lab Results:   Additional Information (SSN: [id number])  Suren Payne, Veronia Beets, LCSW

## 2017-11-04 NOTE — Op Note (Signed)
11/04/2017  11:14 AM  Patient:   Kerri Carter  Pre-Op Diagnosis:   Impingement/tendinopathy with partial-thickness rotator cuff tear and steroid-induced avascular necrosis of humeral head, right shoulder.  Post-Op Diagnosis:   Same  Procedure:   Reverse right total shoulder arthroplasty.  Surgeon:   Pascal Lux, MD  Assistant:   [name] Proud, PA-C  Anesthesia:   GET  Findings:   As above.  Complications:   None  EBL:   100 cc  Fluids:   1000 cc crystalloid  UOP:   None  TT:   None  Drains:   None  Closure:   Staples  Implants:   All press-fit Biomet Comprehensive system with a #10 micro-humeral stem, a 40 mm humeral tray with a +6 mm offset and a standard insert, and a mini-base plate with a 36 mm glenosphere.  Brief Clinical Note:   The patient is a 77 year old female with a history of progressively worsening right shoulder pain. Her symptoms have worsened despite medications, activity modification, etc. Her history and examination consistent with impingement/tendinopathy with a possible rotator cuff tear. An MRI scan confirmed a partial-thickness rotator cuff tear but also demonstrated significant avascular necrosis involving much of the humeral head. The patient presents at this time for a reverse right total shoulder arthroplasty.  Procedure:   The patient was brought into the operating room and lain in the supine position. The patient then underwent general endotracheal intubation and anesthesia before the patient was repositioned in the beach chair position using the beach chair positioner. The right shoulder and upper extremity were prepped with ChloraPrep solution before being draped sterilely. Preoperative antibiotics were administered. A standard anterior approach to the shoulder was made through an approximately 4-5 inch incision. The incision was carried down through the subcutaneous tissues to expose the deltopectoral fascia. The interval between the deltoid  and pectoralis muscles was identified and this plane developed, retracting the cephalic vein laterally with the deltoid muscle. The conjoined tendon was identified. Its lateral margin was dissected and the Kolbel self-retraining retractor inserted. The "three sisters" were identified and cauterized. Bursal tissues were removed to improve visualization. The subscapularis tendon was released from its attachment to the lesser tuberosity 1 cm proximal to its insertion and several tagging sutures placed. The inferior capsule was released with care after identifying and protecting the axillary nerve. The proximal humeral cut was made at approximately 25 of retroversion using the extra-medullary guide.   Attention was redirected to the glenoid. The labrum was debrided circumferentially before the center of the glenoid was marked with electrocautery. The guidewire was drilled into the glenoid neck using the appropriate guide. After verifying its position, it was overreamed with the mini-baseplate reamer to create a flat surface. The permanent mini-baseplate was impacted into place. It was stabilized with a 30 x 6.5 mm central screw and four peripheral screws. Locking screws were placed superiorly and inferiorly while nonlocking screws were placed anteriorly and posteriorly. The permanent 36 mm glenosphere was then impacted into place and its Morse taper locking mechanism verified using manual distraction.  Attention was directed to the humeral side. The humeral canal was reamed sequentially beginning with the end-cutting reamer then progressing from a 4 mm reamer up to an 11 mm reamer. This provided excellent circumferential chatter. The canal was broached beginning with a #8 broach and progressing to a #11 broach. This was left in place and several attempted trial reductions were performed using the standard insert with the +0 mm, +  3 mm, and +6 mm offset trays. Even with the +6 mm offset tray, the shoulder could not  be reduced. Therefore, the trial components were removed and an additional 4 to 5 mm of proximal humerus was resected. The canal was reamed with a 10 mm reamer before the 10 mm broach was inserted. A repeat trial reduction was performed using the +3 mm and +6 mm offset trays. The shoulder could be reduced with the +6 mm offset tray. The arm demonstrated excellent range of motion as the hand could be brought across the chest to the opposite shoulder and brought to the top of the patient's head and to the patient's ear. The shoulder appeared stable throughout this range of motion. The joint was dislocated and the trial components removed. The permanent #10 micro-stem was impacted into place with care taken to maintain the appropriate version. The permanent 40 mm humeral platform with a +6 mm offset and the standard insert was put together on the back table and impacted into place. Again, the [facility] taper locking mechanism was verified using manual distraction. The shoulder was relocated using two finger pressure and again placed through a range of motion with the findings as described above.  The wound was copiously irrigated with bacitracin saline solution using the jet lavage system before a total of 20 cc of Exparel diluted out to 60 cc with normal saline and 30 cc of 0.5% Sensorcaine with epinephrine was injected into the pericapsular and peri-incisional tissues to help with postoperative analgesia. The subscapularis tendon was reapproximated using #2 FiberWire interrupted sutures. The deltopectoral interval was closed using #0 Vicryl interrupted sutures before the subcutaneous tissues were closed using 2-0 Vicryl interrupted sutures. The skin was closed using staples. Prior to closing the skin, 1 g of transexemic acid in 10 cc of normal saline was injected intra-articularly to help with postoperative bleeding. A sterile occlusive dressing was applied to the wound before the arm was placed into a shoulder  immobilizer with an abduction pillow. A Polar Care system also was applied to the shoulder. The patient was then transferred back to a hospital bed before being awakened, extubated, and returned to the recovery room in satisfactory condition after tolerating the procedure well.

## 2017-11-04 NOTE — Anesthesia Preprocedure Evaluation (Signed)
Anesthesia Evaluation  Patient identified by MRN, date of birth, ID band Patient awake    Reviewed: Allergy & Precautions, H&P , NPO status , Patient's Chart, lab work & pertinent test results, reviewed documented beta blocker date and time   History of Anesthesia Complications Negative for: history of anesthetic complications  Airway Mallampati: III  TM Distance: >3 FB Neck ROM: full    Dental  (+) Edentulous Upper, Edentulous Lower, Upper Dentures, Lower Dentures, Dental Advidsory Given   Pulmonary shortness of breath and with exertion, asthma , sleep apnea , COPD,  COPD inhaler, neg recent URI, former smoker,           Cardiovascular Exercise Tolerance: Good hypertension, (-) angina+ CAD  (-) Past MI, (-) Cardiac Stents and (-) CABG negative cardio ROS  (-) dysrhythmias + Valvular Problems/Murmurs      Neuro/Psych PSYCHIATRIC DISORDERS Anxiety Depression Bipolar Disorder negative neurological ROS     GI/Hepatic Neg liver ROS, PUD, GERD  ,  Endo/Other  negative endocrine ROS  Renal/GU CRFRenal disease  negative genitourinary   Musculoskeletal   Abdominal   Peds  Hematology negative hematology ROS (+)   Anesthesia Other Findings Past Medical History: No date: Anxiety No date: Asthma No date: Cataract     Comment:  s/p b/l repair  No date: Chicken pox No date: CKD (chronic kidney disease) No date: CKD (chronic kidney disease), stage III ([facility])     Comment:  a. s/p R nephrectomy. No date: Conversion disorder No date: COPD (chronic obstructive pulmonary disease) ([facility]) No date: Depression No date: Essential hypertension No date: GERD (gastroesophageal reflux disease) No date: Hyperlipidemia No date: Inflammatory arthritis     Comment:  a. hands/carpal tunnel.  b. Low titer rheumatoid factor.              c. Negative anti-CCP antibodies. d. Plaquenil. No date: Non-Obstructive CAD     Comment:  a. 07/2009 Cath  ([facility]): nonobs dzs;  b. 03/2011 Cath               ([facility]): nonobs dzs. No date: Osteoarthritis     Comment:  a. Knees. 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 07/2015 Echo: EF 55-60%, Mild AI, AS, MR, and TR.   Reproductive/Obstetrics negative OB ROS                             Anesthesia Physical  Anesthesia Plan  ASA: III  Anesthesia Plan: General   Post-op Pain Management:    Induction: Intravenous  PONV Risk Score and Plan: 3 and Ondansetron and Dexamethasone  Airway Management Planned: Oral ETT  Additional Equipment:   Intra-op Plan:   Post-operative Plan: Extubation in OR  Informed Consent: I have reviewed the patients History and Physical, chart, labs and discussed the procedure including the risks, benefits and alternatives for the proposed anesthesia with the patient or authorized representative who has indicated his/her understanding and acceptance.     Dental Advisory Given  Plan Discussed with: Anesthesiologist, CRNA and Surgeon  Anesthesia Plan Comments:           Anesthesia Quick Evaluation  

## 2017-11-04 NOTE — H&P (Signed)
Paper H&P to be scanned into permanent record. H&P reviewed and patient re-examined. No changes. 

## 2017-11-04 NOTE — Progress Notes (Signed)
PT Cancellation Note  Patient Details Name: Kerri Carter MRN: [id number] DOB: [birth year]   Cancelled Treatment:    Reason Eval/Treat Not Completed: Fatigue/lethargy limiting ability to participate.  Pt's eyes remained closed while attempting to obtain pt history with pt very lethargic with long pauses between providing information.  Pt stated that she did not feel safe to attempt mobility at this time.  Will hold PT evaluation and attempt to see pt at a future date/time as medically appropriate.     Linus Salmons PT, DPT 11/04/17, 2:15 PM

## 2017-11-04 NOTE — Transfer of Care (Signed)
Immediate Anesthesia Transfer of Care Note  Patient: Kerri Carter  Procedure(s) Performed: REVERSE SHOULDER ARTHROPLASTY (Right Shoulder)  Patient Location: PACU  Anesthesia Type:General  Level of Consciousness: drowsy  Airway & Oxygen Therapy: Patient Spontanous Breathing and Patient connected to face mask oxygen  Post-op Assessment: Report given to RN and Post -op Vital signs reviewed and stable  Post vital signs: Reviewed and stable  Last Vitals:  Vitals Value Taken Time  BP 157/66 11/04/2017 11:26 AM  Temp 36.7 C 11/04/2017 11:26 AM  Pulse 87 11/04/2017 11:32 AM  Resp 17 11/04/2017 11:32 AM  SpO2 98 % 11/04/2017 11:32 AM  Vitals shown include unvalidated device data.  Last Pain:  Vitals:   11/04/17 1126  TempSrc:   PainSc: Asleep         Complications: No apparent anesthesia complications

## 2017-11-04 NOTE — Anesthesia Postprocedure Evaluation (Signed)
Anesthesia Post Note  Patient: Kerri Carter  Procedure(s) Performed: REVERSE SHOULDER ARTHROPLASTY (Right Shoulder)  Patient location during evaluation: PACU Anesthesia Type: General Level of consciousness: awake and alert Pain management: pain level controlled Vital Signs Assessment: post-procedure vital signs reviewed and stable Respiratory status: spontaneous breathing, nonlabored ventilation, respiratory function stable and patient connected to nasal cannula oxygen Cardiovascular status: blood pressure returned to baseline and stable Postop Assessment: no apparent nausea or vomiting Anesthetic complications: no     Last Vitals:  Vitals:   11/04/17 1205 11/04/17 1211  BP:  (!) 150/55  Pulse: 84 84  Resp: 11 12  Temp:  36.4 C  SpO2: 92% 92%    Last Pain:  Vitals:   11/04/17 1211  TempSrc:   PainSc: 0-No pain                 Martha Clan

## 2017-11-04 NOTE — Anesthesia Procedure Notes (Signed)
Procedure Name: Intubation Date/Time: 11/04/2017 9:04 AM Performed by: Gentry Fitz, CRNA Pre-anesthesia Checklist: Patient identified, Emergency Drugs available, Suction available and Patient being monitored Patient Re-evaluated:Patient Re-evaluated prior to induction Oxygen Delivery Method: Circle system utilized Preoxygenation: Pre-oxygenation with 100% oxygen Induction Type: IV induction Ventilation: Mask ventilation without difficulty Laryngoscope Size: Mac and 4 Grade View: Grade I Tube type: Oral Tube size: 7.0 mm Number of attempts: 1 Airway Equipment and Method: Stylet Placement Confirmation: ETT inserted through vocal cords under direct vision,  positive ETCO2 and breath sounds checked- equal and bilateral Secured at: 22 cm Tube secured with: Tape Dental Injury: Teeth and Oropharynx as per pre-operative assessment

## 2017-11-05 ENCOUNTER — Encounter: Payer: Self-pay | Admitting: Surgery

## 2017-11-05 LAB — CBC WITH DIFFERENTIAL/PLATELET
Basophils Absolute: 0.1 10*3/uL (ref 0–0.1)
Basophils Relative: 1 %
Eosinophils Absolute: 0 10*3/uL (ref 0–0.7)
Eosinophils Relative: 0 %
HCT: 33.3 % — ABNORMAL LOW (ref 35.0–47.0)
Hemoglobin: 11.1 g/dL — ABNORMAL LOW (ref 12.0–16.0)
Lymphocytes Relative: 8 %
Lymphs Abs: 1.2 10*3/uL (ref 1.0–3.6)
MCH: 29.6 pg (ref 26.0–34.0)
MCHC: 33.4 g/dL (ref 32.0–36.0)
MCV: 88.6 fL (ref 80.0–100.0)
Monocytes Absolute: 1.1 10*3/uL — ABNORMAL HIGH (ref 0.2–0.9)
Monocytes Relative: 7 %
Neutro Abs: 13.2 10*3/uL — ABNORMAL HIGH (ref 1.4–6.5)
Neutrophils Relative %: 84 %
Platelets: 184 10*3/uL (ref 150–440)
RBC: 3.76 MIL/uL — ABNORMAL LOW (ref 3.80–5.20)
RDW: 15.2 % — ABNORMAL HIGH (ref 11.5–14.5)
WBC: 15.6 10*3/uL — ABNORMAL HIGH (ref 3.6–11.0)

## 2017-11-05 LAB — BASIC METABOLIC PANEL
Anion gap: 9 (ref 5–15)
BUN: 17 mg/dL (ref 8–23)
CO2: 27 mmol/L (ref 22–32)
Calcium: 8 mg/dL — ABNORMAL LOW (ref 8.9–10.3)
Chloride: 105 mmol/L (ref 98–111)
Creatinine, Ser: 1.16 mg/dL — ABNORMAL HIGH (ref 0.44–1.00)
GFR calc Af Amer: 52 mL/min — ABNORMAL LOW (ref >60)
GFR calc non Af Amer: 45 mL/min — ABNORMAL LOW (ref >60)
Glucose, Bld: 145 mg/dL — ABNORMAL HIGH (ref 70–99)
Potassium: 4.4 mmol/L (ref 3.5–5.1)
Sodium: 141 mmol/L (ref 135–145)

## 2017-11-05 MED ORDER — TRAMADOL HCL 50 MG PO TABS: 50.0000 mg | ORAL_TABLET | Freq: Four times a day (QID) | ORAL | 0 refills | Status: DC | PRN

## 2017-11-05 MED ORDER — OXYCODONE HCL 5 MG PO TABS: 5.0000 mg | ORAL_TABLET | ORAL | 0 refills | Status: DC | PRN

## 2017-11-05 MED ORDER — ASPIRIN EC 325 MG PO TBEC: 325.0000 mg | DELAYED_RELEASE_TABLET | Freq: Every day | ORAL | 0 refills | Status: DC

## 2017-11-05 NOTE — Progress Notes (Signed)
Physical Therapy Treatment Patient Details Name: Kerri Carter MRN: [id number] DOB: 1940/06/11 Today's Date: 11/05/2017    History of Present Illness Pt is a 77 yo Female who was admitted to [facility] for a reverse right total shoulder arthroplasty secondary to impingement/tendinopathy with partial-thickness rotator cuff tear and steroid-induced avascular necrosis of humeral head, right shoulder. PMH includes: leukocytosis, hip fracture, lumbar spinal stenosis, osteoporosis, bipolar disorder, CKD III, HTN, HLD, and COPD.     PT Comments    Pt presents with deficits in transfers, gait, balance, and activity tolerance but is progressing well towards goals.  Pt was able to go from sup to/from sit with very good speed and effort with the use of the bed rail.  Pt demonstrated improved stability during transfers and ambulation with a HW and was able to ambulate 80 feet with SpO2 ranging from 93-96% on room air.  Pt c/o 7/10 R shoulder pain pre-medicated upon entering pt's room but reported no increase in shoulder pain during therex or ambulation.  Pt will benefit from HHPT services upon discharge to safely address above deficits for decreased caregiver assistance and eventual return to PLOF.     Follow Up Recommendations  Home health PT;Supervision for mobility/OOB     Equipment Recommendations  Other (comment)(Hemi-walker; Pt does not want one if insurance will not pay)    Recommendations for Other Services       Precautions / Restrictions Precautions Precautions: Fall Restrictions Weight Bearing Restrictions: Yes RUE Weight Bearing: Non weight bearing    Mobility  Bed Mobility Overal bed mobility: Modified Independent             General bed mobility comments: Used bed rail to assist  Transfers Overall transfer level: Needs assistance Equipment used: Hemi-walker Transfers: Sit to/from Stand Sit to Stand: Supervision         General transfer comment: Good stability and  control during transfers  Ambulation/Gait Ambulation/Gait assistance: Min guard Gait Distance (Feet): 80 Feet Assistive device: Hemi-walker Gait Pattern/deviations: Step-through pattern;Decreased step length - right;Decreased step length - left Gait velocity: Decreased   General Gait Details: Slow cadence with short B step length that pt reports is baseline; improved stability with gait this session with no LOB   Stairs             Wheelchair Mobility    Modified Rankin (Stroke Patients Only)       Balance Overall balance assessment: No apparent balance deficits (not formally assessed)                                          Cognition Arousal/Alertness: Awake/alert Behavior During Therapy: WFL for tasks assessed/performed Overall Cognitive Status: Within Functional Limits for tasks assessed                                        Exercises Total Joint Exercises Ankle Circles/Pumps: AROM;Both;10 reps Quad Sets: Strengthening;Both;10 reps Gluteal Sets: Strengthening;Both;10 reps Towel Squeeze: Strengthening;Both;10 reps Heel Slides: AROM;Both;10 reps Hip ABduction/ADduction: AROM;Both;5 reps Straight Leg Raises: AROM;Both;5 reps Long Arc Quad: AROM;Both;10 reps Knee Flexion: AROM;Both;10 reps Marching in Standing: AROM;Both;10 reps;Seated;Standing Other Exercises Other Exercises: Static standing balance training without UE support with feet apart, together, and semi-tandem with combinations of eyes open/closed and head still/head turns  General Comments        Pertinent Vitals/Pain Pain Assessment: 0-10 Pain Score: 7  Pain Location: Right shoulder Pain Descriptors / Indicators: Aching;Sore Pain Intervention(s): Premedicated before session;Monitored during session    [place] expects to be discharged to:: Private residence Living Arrangements: Spouse/significant other;Other relatives Available Help  at Discharge: Family;Available 24 hours/day Type of Home: House Home Access: Ramped entrance   Home Layout: One level Home Equipment: Walker - 2 wheels;Cane - single point;Cane - quad;Bedside commode      Prior Function Level of Independence: Independent      Comments: Ind amb without AD, Ind with ADLs, no fall history in the last year    PT Goals (current goals can now be found in the care plan section) Acute Rehab PT Goals Patient Stated Goal: To regain independence PT Goal Formulation: With patient Time For Goal Achievement: 11/18/17 Potential to Achieve Goals: Good Progress towards PT goals: Progressing toward goals    Frequency    BID      PT Plan Current plan remains appropriate    Co-evaluation              AM-PAC PT "6 Clicks" Daily Activity  Outcome Measure  Difficulty turning over in bed (including adjusting bedclothes, sheets and blankets)?: A Little Difficulty moving from lying on back to sitting on the side of the bed? : A Little Difficulty sitting down on and standing up from a chair with arms (e.g., wheelchair, bedside commode, etc,.)?: Unable Help needed moving to and from a bed to chair (including a wheelchair)?: A Little Help needed walking in hospital room?: A Little Help needed climbing 3-5 steps with a railing? : A Little 6 Click Score: 16    End of Session Equipment Utilized During Treatment: Gait belt Activity Tolerance: Patient tolerated treatment well;Other (comment)(No increase in R shoulder pain during session) Patient left: in bed;with call bell/phone within reach;with family/visitor present;with bed alarm set;with SCD's reapplied;Other (comment)(Polar care to R shoulder) Nurse Communication: Mobility status PT Visit Diagnosis: Unsteadiness on feet (R26.81);Difficulty in walking, not elsewhere classified (R26.2)     Time: 3149-7026 PT Time Calculation (min) (ACUTE ONLY): 24 min  Charges:  $Gait Training: 8-22 mins $Therapeutic  Exercise: 8-22 mins                     D. Scott Mariea Mcmartin PT, DPT 11/05/17, 2:41 PM

## 2017-11-05 NOTE — Plan of Care (Signed)

## 2017-11-05 NOTE — Discharge Instructions (Signed)
Diet: As you were doing prior to hospitalization   Shower:  May shower but keep the wounds dry, use an occlusive plastic wrap, NO SOAKING IN TUB.  If the bandage gets wet, change with a clean dry gauze.  Dressing:  You may change your dressing as needed. Change the dressing with sterile gauze dressing.    Activity:  Increase activity slowly as tolerated, but follow the weight bearing instructions below.  No lifting or driving for 6 weeks.  Weight Bearing:   Non-weightbearing to the right arm.  Blood Clot Prevention: Take Eliquis for 10 days as prescribed.  To prevent constipation: you may use a stool softener such as -  Colace (over the counter) 100 mg by mouth twice a day  Drink plenty of fluids (prune juice may be helpful) and high fiber foods Miralax (over the counter) for constipation as needed.    Itching:  If you experience itching with your medications, try taking only a single pain pill, or even half a pain pill at a time.  You may take up to 10 pain pills per day, and you can also use benadryl over the counter for itching or also to help with sleep.   Precautions:  If you experience chest pain or shortness of breath - call 911 immediately for transfer to the hospital emergency department!!  If you develop a fever greater that 101 F, purulent drainage from wound, increased redness or drainage from wound, or calf pain-Call [place]                                              Follow- Up Appointment:  Please call for an appointment to be seen in 2 weeks at [facility]

## 2017-11-05 NOTE — Plan of Care (Signed)
  Problem: Education: Goal: Knowledge of General Education information will improve Description Including pain rating scale, medication(s)/side effects and non-pharmacologic comfort measures 11/05/2017 0444 by Bryna Colander, RN Outcome: Progressing 11/05/2017 0444 by Bryna Colander, RN Outcome: Progressing   Problem: Health Behavior/Discharge Planning: Goal: Ability to manage health-related needs will improve 11/05/2017 0444 by Auston Halfmann, Lucille Passy, RN Outcome: Progressing 11/05/2017 0444 by Bryna Colander, RN Outcome: Progressing   Problem: Clinical Measurements: Goal: Ability to maintain clinical measurements within normal limits will improve 11/05/2017 0444 by Bryna Colander, RN Outcome: Progressing 11/05/2017 0444 by Bryna Colander, RN Outcome: Progressing Goal: Will remain free from infection 11/05/2017 0444 by Bryna Colander, RN Outcome: Progressing 11/05/2017 0444 by Bryna Colander, RN Outcome: Progressing Goal: Diagnostic test results will improve 11/05/2017 0444 by Bryna Colander, RN Outcome: Progressing 11/05/2017 0444 by Bryna Colander, RN Outcome: Progressing Goal: Respiratory complications will improve 11/05/2017 0444 by Bryna Colander, RN Outcome: Progressing 11/05/2017 0444 by Bryna Colander, RN Outcome: Progressing Goal: Cardiovascular complication will be avoided 11/05/2017 0444 by Bryna Colander, RN Outcome: Progressing 11/05/2017 0444 by Bryna Colander, RN Outcome: Progressing   Problem: Activity: Goal: Risk for activity intolerance will decrease 11/05/2017 0444 by Bryna Colander, RN Outcome: Progressing 11/05/2017 0444 by Bryna Colander, RN Outcome: Progressing   Problem: Nutrition: Goal: Adequate nutrition will be maintained 11/05/2017 0444 by Bryna Colander, RN Outcome: Progressing 11/05/2017 0444 by Gerlad Pelzel, Lucille Passy, RN Outcome: Progressing

## 2017-11-05 NOTE — Progress Notes (Signed)
Clinical Social Worker (CSW) received SNF consult. PT is recommending home health. RN case manager aware of above. Please reconsult if future social work needs arise. CSW signing off.   Chaunta Bejarano, LCSW [phone] 

## 2017-11-05 NOTE — Discharge Summary (Addendum)
Physician Discharge Summary  Patient ID: Kerri Carter MRN: [id number] DOB/AGE: 75-23-1942 75 y.o.  Admit date: 11/04/2017 Discharge date: 11/05/2017  Admission Diagnoses:  STEROID INDUCED AVASCULAR NECROSIS OF RIGHT SHOULDER, ROTATOR CUFF TENDINITIS  Discharge Diagnoses: Patient Active Problem List   Diagnosis Date Noted  . Status post reverse total shoulder replacement, right 11/04/2017  . Leukocytosis 10/19/2017  . Allergic rhinitis 09/28/2017  . Leg swelling 12/15/2016  . Hip fracture ([place]) 10/31/2016  . Left foot pain 10/19/2016  . Spinal stenosis of lumbar region 09/15/2016  . Osteoporosis 08/27/2016  . Prediabetes 08/25/2016  . Mixed bipolar I disorder ([place]) 10/09/2015  . CKD (chronic kidney disease), stage III ([place])   . Essential hypertension   . Conversion disorder 08/04/2015  . Hyperlipidemia 07/17/2015  . 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 Macular degeneration 07/17/2015  . Insomnia 07/17/2015  . Rheumatoid arthritis ([place]) 12/05/2014  . Valvular heart disease 10/13/2011  . COPD (chronic obstructive pulmonary disease) ([place]) 09/09/2011  . OSA (obstructive sleep apnea) 09/09/2011  . Nocturnal hypoxemia 09/09/2011  . Gastroesophageal reflux disease 02/24/2011  . Single kidney 02/24/2011    Past Medical History:  Diagnosis Date  . Anxiety   . Asthma   . Cataract    s/p b/l repair   . Chicken pox   . CKD (chronic kidney disease)   . CKD (chronic kidney disease), stage III ([place])    a. s/p R nephrectomy.  . Conversion disorder   . COPD (chronic obstructive pulmonary disease) ([place])   . Depression   . Essential hypertension   . GERD (gastroesophageal reflux disease)   . Hyperlipidemia   . Inflammatory arthritis    a. hands/carpal tunnel.  b. Low titer rheumatoid factor. c. Negative anti-CCP antibodies. d. Plaquenil.  . Non-Obstructive CAD    a. 07/2009 Cath ([facility]): nonobs dzs;  b. 03/2011 Cath [facility]): nonobs dzs.  .  Osteoarthritis    a. Knees.  . PUD (peptic ulcer disease)   . S/P right hip fracture    11/01/16 s/p repair  . Spinal stenosis at L4-L5 level    severe with L4/L5 anterolisthesis grade 1 anterolisthesis   . Toxic maculopathy   . Valvular heart disease    a. 07/2015 Echo: EF 55-60%, Mild AI, AS, MR, and TR.     Transfusion: None.   Consultants (if any):   Discharged Condition: Improved  Hospital Course: Kerri Carter is an 77 y.o. female who was admitted 11/04/2017 with a diagnosis of a partial thickness rotator cuff tear with underlying avascular necrosis and went to the operating room on 11/04/2017 and underwent the above named procedures.    Surgeries: Procedure(s): REVERSE SHOULDER ARTHROPLASTY on 11/04/2017 Patient tolerated the surgery well. Taken to PACU where she was stabilized and then transferred to the orthopedic floor.  Started on Lovenox 40mg  q 24 hrs. Foot pumps applied bilaterally at 80 mm. Heels elevated on bed with rolled towels. No evidence of DVT. Negative Homan. Physical therapy started on day #1 for gait training and transfer. OT started day #1 for ADL and assisted devices.  Patient's IV was removed on POD1.  Implants: All press-fit [employer] Comprehensive system with a #10 micro-humeral stem, a 40 mm humeral tray with a +6 mm offset and a standard insert, and a mini-base plate with a 36 mm glenosphere.  She was given perioperative antibiotics:  Anti-infectives (From admission, onward)   Start     Dose/Rate Route Frequency Ordered Stop   11/04/17  1930  vancomycin (VANCOCIN) IVPB 1000 mg/200 mL premix     1,000 mg 200 mL/hr over 60 Minutes Intravenous Every 12 hours 11/04/17 1231 11/04/17 1920   11/03/17 2230  vancomycin (VANCOCIN) IVPB 1000 mg/200 mL premix     1,000 mg 200 mL/hr over 60 Minutes Intravenous  Once 11/03/17 2227 11/04/17 0835    .  She was given sequential compression devices, early ambulation, and Lovenox for DVT prophylaxis.  She benefited  maximally from the hospital stay and there were no complications.    Recent vital signs:  Vitals:   11/05/17 1345 11/05/17 1726  BP:  (!) 148/66  Pulse: 80 79  Resp:    Temp:  98.3 F (36.8 C)  SpO2: 96% 94%    Recent laboratory studies:  Lab Results  Component Value Date   HGB 11.1 (L) 11/05/2017   HGB 12.4 10/19/2017   HGB 13.8 08/06/2017   Lab Results  Component Value Date   WBC 15.6 (H) 11/05/2017   PLT 184 11/05/2017   Lab Results  Component Value Date   INR 0.90 10/21/2017   Lab Results  Component Value Date   NA 141 11/05/2017   K 4.4 11/05/2017   CL 105 11/05/2017   CO2 27 11/05/2017   BUN 17 11/05/2017   CREATININE 1.16 (H) 11/05/2017   GLUCOSE 145 (H) 11/05/2017    Discharge Medications:   Allergies as of 11/05/2017      Reactions   Ceftin [cefuroxime Axetil] Anaphylaxis, Other (See Comments)   Other reaction(s): Other (See Comments) REACTION: tongue and throat swell Other Reaction: TONGUE AND THROAT SWELLING REACTION: tongue and throat swell   Lisinopril Anaphylaxis, Other (See Comments)   REACTION: tongue and throat swelling (onset 10-10-09)   Morphine Other (See Comments)   Per patient, low blood pressure issues, following morphine, that requires action to raise it back up. Drop in BP - can take small infrequent doses   Sulfasalazine Anaphylaxis, Other (See Comments)   Swelling tongue, lips, and jaws   Aspirin Other (See Comments)   Sulfasalazine allergy cross reacts   Antihistamines, Chlorpheniramine-type Other (See Comments)   REACTION: makes pt hyper   Antivert [meclizine Hcl] Other (See Comments)   REACTION: bladder will not empty   Decongestant [pseudoephedrine Hcl]    Other reaction(s): Unknown REACTION: makes pt hyper REACTION: makes pt hyper   Doxycycline    REACTION: GI upset   Morphine And Related Other (See Comments)   Per patient, low blood pressure issues, following morphine, that requires action to raise it back up.    Polymyxin B    Medication was in eye drops.   Pseudoephedrine Hcl Other (See Comments)   REACTION: makes pt hyper   Sulfa Antibiotics Other (See Comments)   Face swelling   Xarelto [rivaroxaban]    Other reaction(s): Other (See Comments) GI bleed Stomach burning, bleeding, and tar in stool GI bleed   Adhesive [tape] Rash   Iodine Hives, Rash   Per patient allergy is to contrast dye only, she is able to use betadine scrubs.   Levaquin [levofloxacin In D5w] Rash   Tetanus Toxoid Adsorbed Other (See Comments)   REACTION: rash, fever, hot to touch at injection site   Tetanus Toxoids Other (See Comments)   REACTION: rash, fever, hot to touch at injection site      Medication List    TAKE these medications   albuterol 108 (90 Base) MCG/ACT inhaler Commonly known as:  PROVENTIL HFA;VENTOLIN HFA Inhale  2 puffs into the lungs every 6 (six) hours as needed. What changed:  reasons to take this   clotrimazole 1 % cream Commonly known as:  LOTRIMIN Apply 1 application topically 2 (two) times daily. Bid to abdomen 7-10 days What changed:    when to take this  reasons to take this  additional instructions   escitalopram 10 MG tablet Commonly known as:  LEXAPRO Take 1 tablet (10 mg total) by mouth daily.   fluticasone furoate-vilanterol 100-25 MCG/INH Aepb Commonly known as:  BREO ELLIPTA Inhale 1 puff into the lungs daily. Rinse mouth   folic acid 1 MG tablet Commonly known as:  FOLVITE Take 1 mg by mouth daily.   gabapentin 300 MG capsule Commonly known as:  NEURONTIN Take 1 capsule (300 mg total) by mouth 4 (four) times daily. What changed:    how much to take  when to take this   lamoTRIgine 100 MG tablet Commonly known as:  LAMICTAL Take 1 tablet (100 mg total) by mouth 2 (two) times daily.   leflunomide 20 MG tablet Commonly known as:  ARAVA Take 1 tablet (20 mg total) by mouth daily.   LORazepam 0.5 MG tablet Commonly known as:  ATIVAN Take 1/2 pill at  bed time What changed:    how much to take  how to take this  when to take this  additional instructions   lovastatin 20 MG tablet Commonly known as:  MEVACOR TAKE 1 TABLET BY MOUTH AT BEDTIME   Melatonin 10 MG Tabs Take 10 mg by mouth at bedtime. What changed:  how much to take   montelukast 10 MG tablet Commonly known as:  SINGULAIR Take 1 tablet (10 mg total) by mouth daily.   multivitamin-lutein Caps capsule Take 1 capsule by mouth 2 (two) times daily.   mupirocin ointment 2 % Commonly known as:  BACTROBAN Apply 1 application topically 2 (two) times daily. X 1 week   MYRBETRIQ 50 MG Tb24 tablet Generic drug:  mirabegron ER Take 50 mg by mouth daily.   nebivolol 10 MG tablet Commonly known as:  BYSTOLIC Take 1 tablet (10 mg total) by mouth daily. What changed:  how much to take   ondansetron 4 MG tablet Commonly known as:  ZOFRAN Take 1 tablet (4 mg total) by mouth every 8 (eight) hours as needed for nausea or vomiting.   oxyCODONE 5 MG immediate release tablet Commonly known as:  Oxy IR/ROXICODONE Take 1-2 tablets (5-10 mg total) by mouth every 4 (four) hours as needed for breakthrough pain. What changed:    how much to take  when to take this   pantoprazole 40 MG tablet Commonly known as:  PROTONIX Take 1 tablet (40 mg total) by mouth daily. 30 minutes before food. Note reduction in frequency   QUEtiapine 25 MG tablet Commonly known as:  SEROQUEL Take 1 tablet (25 mg total) by mouth at bedtime.   ranitidine 150 MG capsule Commonly known as:  ZANTAC Take 1 capsule (150 mg total) by mouth 2 (two) times daily.   sucralfate 1 g tablet Commonly known as:  CARAFATE TAKE 1 TABLET BY MOUTH 4 TIMES A DAY WITH MEALS AND AT BEDTIME What changed:  See the new instructions.   Teriparatide (Recombinant) 600 MCG/2.4ML Soln Inject 0.08 mLs (20 mcg total) into the skin daily.   traMADol 50 MG tablet Commonly known as:  ULTRAM Take 1 tablet (50 mg total)  by mouth every 6 (six) hours as needed. What  changed:  when to take this   Vitamin D3 50000 units Caps Take 1 capsule by mouth once a week. Same day What changed:    how much to take  when to take this  additional instructions       Diagnostic Studies: Dg Shoulder Right Port  Result Date: 11/04/2017 CLINICAL DATA:  Status post right shoulder replacement today. EXAM: PORTABLE RIGHT SHOULDER COMPARISON:  MRI right shoulder 06/30/2017. FINDINGS: New reverse shoulder arthroplasty is in place. The device is located. No fracture is identified. Surgical staples noted. IMPRESSION: Status post right shoulder replacement.  No acute finding. Electronically Signed   By: Inge Rise M.D.   On: 11/04/2017 12:35   Disposition: Discharge disposition: 01-Home or Self Care     Plan is for discharge home on 11/05/17 pending progress with physical therapy.  Patient is allergic to aspirin and Xarelto.  She is already taking a injectable medication so will discharge home on preventative dose of Eliquis for 10 days for DVT prophylaxis.  Follow-up Information    Lattie Corns, PA-C On 11/19/2017.   Specialty:  Physician Assistant Why:  Staple Removal.;  @ 10:15 am Contact information: [address] [address] [phone]          Signed: Judson Roch PA-C 11/05/2017, 6:21 PM

## 2017-11-05 NOTE — Progress Notes (Signed)
Patient is being discharged. Patient stated that she can't take ASA. Notified [facility], She will change to Eliquis, Mia Creek will call in script for patient. Reviewed discharge instruction, grandson was at bedside, along with husband. IV removed with cath intact. Shoulder immobilizer in place. Dressing is CD&I. Sent extra dressings with patient. Signed scripts sent also.

## 2017-11-05 NOTE — Evaluation (Signed)
Physical Therapy Evaluation Patient Details Name: Kerri Carter MRN: [id number] DOB: 1941/01/02 Today's Date: 11/05/2017   History of Present Illness  Pt is a 77 yo F diagnosed with impingement/tendinopathy with partial-thickness rotator cuff tear and steroid-induced avascular necrosis of humeral head, right shoulder.  Pt is now s/p reverse right total shoulder arthroplasty.  PMH includes: leukocytosis, hip fracture, lumbar spinal stenosis, osteoporosis, bipolar disorder, CKD III, HTN, HLD, and COPD.     Clinical Impression  Pt presents with deficits in transfers, mobility, gait, balance, and activity tolerance but overall performed well during the session.  Pt required extra time and effort during sup to/from sit along with the use of the bed rail but did not require physical assistance.  Pt was CGA with sit to/from stand transfers for safety but was steady without LOB and demonstrated good eccentric and concentric control.  Pt attempted amb without an AD and somewhat unsteady reaching for the counter with her LUE for stability.  Pt transitioned to amb with a HW where she presented with increased stability and confidence with amb.  Pt's SpO2 remained >/= 92% on room air during the session with no adverse symptoms noted other than R shoulder pain that did not worsen with activity.  Pt will benefit from HHPT services upon discharge to safely address above deficits for decreased caregiver assistance and eventual return to PLOF.      Follow Up Recommendations Home health PT;Supervision for mobility/OOB    Equipment Recommendations  Other (comment)(Hemi walker)    Recommendations for Other Services       Precautions / Restrictions Precautions Precautions: Fall Restrictions Weight Bearing Restrictions: Yes RUE Weight Bearing: Non weight bearing      Mobility  Bed Mobility Overal bed mobility: Modified Independent             General bed mobility comments: Extra time and effort and  use of bed rail required but no physical assistance needed  Transfers Overall transfer level: Needs assistance Equipment used: None Transfers: Sit to/from Stand Sit to Stand: Min guard         General transfer comment: Good stability and control during transfers  Ambulation/Gait Ambulation/Gait assistance: Min guard Gait Distance (Feet): 30 Feet Assistive device: Hemi-walker Gait Pattern/deviations: Step-through pattern;Decreased step length - right;Decreased step length - left Gait velocity: Decreased   General Gait Details: Slow cadence with short B step length that pt reports is baseline; min instability with gait but pt able to self-correct with use of a [employer]  Stairs            Wheelchair Mobility    Modified Rankin (Stroke Patients Only)       Balance Overall balance assessment: Mild deficits observed, not formally tested                                           Pertinent Vitals/Pain Pain Assessment: 0-10 Pain Score: 5  Pain Location: R shoulder Pain Descriptors / Indicators: Sore;Aching Pain Intervention(s): Premedicated before session;Monitored during session    Home Living Family/patient expects to be discharged to:: Private residence Living Arrangements: Spouse/significant other;Other relatives Available Help at Discharge: Family;Available 24 hours/day Type of Home: House Home Access: Ramped entrance     Home Layout: One level Home Equipment: Walker - 2 wheels;Cane - single point;Cane - quad;Bedside commode      Prior Function Level of Independence: Independent  Comments: Ind amb without AD, Ind with ADLs, no fall history in the last year      Hand Dominance   Dominant Hand: Right    Extremity/Trunk Assessment   Upper Extremity Assessment Upper Extremity Assessment: Defer to OT evaluation    Lower Extremity Assessment Lower Extremity Assessment: Overall WFL for tasks assessed       Communication    Communication: No difficulties  Cognition Arousal/Alertness: Awake/alert Behavior During Therapy: WFL for tasks assessed/performed Overall Cognitive Status: Within Functional Limits for tasks assessed                                        General Comments      Exercises Total Joint Exercises Ankle Circles/Pumps: AROM;Both;10 reps Quad Sets: Strengthening;Both;10 reps Gluteal Sets: Strengthening;Both;10 reps Heel Slides: AROM;Both;10 reps Hip ABduction/ADduction: AROM;Both;5 reps Straight Leg Raises: AROM;Both;5 reps Long Arc Quad: AROM;Both;10 reps Knee Flexion: AROM;Both;10 reps Marching in Standing: AROM;Both;10 reps;Seated;Standing   Assessment/Plan    PT Assessment Patient needs continued PT services  PT Problem List Decreased activity tolerance;Decreased balance;Decreased knowledge of use of DME;Decreased mobility       PT Treatment Interventions DME instruction;Gait training;Functional mobility training;Balance training;Therapeutic activities;Therapeutic exercise;Patient/family education    PT Goals (Current goals can be found in the Care Plan section)  Acute Rehab PT Goals Patient Stated Goal: Decreased RUE pain and to use the RUE again PT Goal Formulation: With patient Time For Goal Achievement: 11/18/17 Potential to Achieve Goals: Good    Frequency BID   Barriers to discharge        Co-evaluation               AM-PAC PT "6 Clicks" Daily Activity  Outcome Measure Difficulty turning over in bed (including adjusting bedclothes, sheets and blankets)?: A Little Difficulty moving from lying on back to sitting on the side of the bed? : A Little Difficulty sitting down on and standing up from a chair with arms (e.g., wheelchair, bedside commode, etc,.)?: Unable Help needed moving to and from a bed to chair (including a wheelchair)?: A Little Help needed walking in hospital room?: A Little Help needed climbing 3-5 steps with a railing? : A  Little 6 Click Score: 16    End of Session Equipment Utilized During Treatment: Gait belt Activity Tolerance: Patient tolerated treatment well Patient left: in bed;Other (comment)(OT entered room at end of session for OT evaluation) Nurse Communication: Mobility status PT Visit Diagnosis: Unsteadiness on feet (R26.81);Difficulty in walking, not elsewhere classified (R26.2)    Time: 7035-0093 PT Time Calculation (min) (ACUTE ONLY): 30 min   Charges:   PT Evaluation $PT Eval Low Complexity: 1 Low PT Treatments $Therapeutic Exercise: 8-22 mins        D. Scott Mahkai Fangman PT, DPT 11/05/17, 11:39 AM

## 2017-11-05 NOTE — Progress Notes (Signed)
Subjective: 1 Day Post-Op Procedure(s) (LRB): REVERSE SHOULDER ARTHROPLASTY (Right) Patient reports pain as mild.   Patient is well, and has had no acute complaints or problems PT and Care management to assist with discharge planning. Negative for chest pain and shortness of breath Fever: no Gastrointestinal:Negative for nausea and vomiting  Objective: Vital signs in last 24 hours: Temp:  [97.5 F (36.4 C)-98.2 F (36.8 C)] 97.7 F (36.5 C) (10/04 0747) Pulse Rate:  [73-87] 73 (10/04 0747) Resp:  [11-19] 17 (10/04 0318) BP: (113-170)/(40-80) 154/70 (10/04 0747) SpO2:  [88 %-100 %] 100 % (10/04 0747) Weight:  [75.8 kg] 75.8 kg (10/03 1301)  Intake/Output from previous day:  Intake/Output Summary (Last 24 hours) at 11/05/2017 0748 Last data filed at 11/05/2017 0400 Gross per 24 hour  Intake 1901.5 ml  Output 100 ml  Net 1801.5 ml    Intake/Output this shift: No intake/output data recorded.  Labs: Recent Labs    11/05/17 0523  HGB 11.1*   Recent Labs    11/05/17 0523  WBC 15.6*  RBC 3.76*  HCT 33.3*  PLT 184   Recent Labs    11/05/17 0523  NA 141  K 4.4  CL 105  CO2 27  BUN 17  CREATININE 1.16*  GLUCOSE 145*  CALCIUM 8.0*   No results for input(s): LABPT, INR in the last 72 hours.   EXAM General - Patient is Alert, Appropriate and Oriented Extremity - ABD soft Dorsiflexion/Plantar flexion intact Incision: dressing C/D/I No cellulitis present  Intact to light touch to the right upper extremity. Dressing/Incision - clean, dry, no drainage, moderate ecchymosis around the incision but no drainage noted. Motor Function - intact, moving foot and toes well on exam.  Able to move fingers on command. Abdomen soft with normal BS.  Past Medical History:  Diagnosis Date  . Anxiety   . Asthma   . Cataract    s/p b/l repair   . Chicken pox   . CKD (chronic kidney disease)   . CKD (chronic kidney disease), stage III ([place])    a. s/p R nephrectomy.  .  Conversion disorder   . COPD (chronic obstructive pulmonary disease) ([place])   . Depression   . Essential hypertension   . GERD (gastroesophageal reflux disease)   . Hyperlipidemia   . Inflammatory arthritis    a. hands/carpal tunnel.  b. Low titer rheumatoid factor. c. Negative anti-CCP antibodies. d. Plaquenil.  . Non-Obstructive CAD    a. 07/2009 Cath ([facility]): nonobs dzs;  b. 03/2011 Cath [facility]): nonobs dzs.  . Osteoarthritis    a. Knees.  . PUD (peptic ulcer disease)   . S/P right hip fracture    11/01/16 s/p repair  . Spinal stenosis at L4-L5 level    severe with L4/L5 anterolisthesis grade 1 anterolisthesis   . Toxic maculopathy   . Valvular heart disease    a. 07/2015 Echo: EF 55-60%, Mild AI, AS, MR, and TR.    Assessment/Plan: 1 Day Post-Op Procedure(s) (LRB): REVERSE SHOULDER ARTHROPLASTY (Right) Active Problems:   Status post reverse total shoulder replacement, right  Estimated body mass index is 31.59 kg/m as calculated from the following:   Height as of this encounter: 5\' 1"  (1.549 m).   Weight as of this encounter: 75.8 kg. Advance diet Up with therapy D/C IV fluids when tolerating po intake.  Labs reviewed this AM. Up with therapy today. Pt is passing gas without pain. Plan will be for discharge home this afternoon pending progress  with PT.  DVT Prophylaxis - Lovenox, Foot Pumps and TED hose Non-weightbearing to the right arm.  Raquel Kell Ferris, PA-C [facility]opa[facility] 11/05/2017, 7:48 AM

## 2017-11-05 NOTE — Care Management Note (Signed)
Case Management Note  Patient Details  Name: Kerri Carter MRN: [id number] Date of Birth: 07-Jul-1940  Subjective/Objective:   Met with patient and her spouse at bedside to discuss transitions of care. Patient offered a list of home health agencies. Referral to [facility] for PT/OT. No DME needed. ASA.                  Action/Plan: Discharging today. [facility] for PT/ OT.   Expected Discharge Date:                  Expected Discharge Plan:     In-House Referral:     Discharge planning Services  CM Consult  Post Acute Care Choice:  Home Health Choice offered to:  Patient, Spouse  DME Arranged:    DME Agency:     HH Arranged:  PT, OT [place] Agency:  [facility] (formerly [employer])  Status of Service:  Completed, signed off  If discussed at [employer], dates discussed:    Additional Comments:  Jolly Mango, RN 11/05/2017, 12:37 PM

## 2017-11-05 NOTE — Evaluation (Addendum)
Occupational Therapy Evaluation Patient Details Name: Kerri Carter MRN: [id number] DOB: [birth year] Today's Date: 11/05/2017    History of Present Illness Pt is a 77 yo Female who was admitted to [facility] for a reverse right total shoulder arthroplasty secondary to impingement/tendinopathy with partial-thickness rotator cuff tear and steroid-induced avascular necrosis of humeral head, right shoulder. PMH includes: leukocytosis, hip fracture, lumbar spinal stenosis, osteoporosis, bipolar disorder, CKD III, HTN, HLD, and COPD.    Clinical Impression   Pt. presents with dominant RUE immobilization, weakness, and limited functional mobility which limits her ability to complete basic ADL and IADL functioning. Pt. resides at home with her husband who is supportive. Pt. was independent with ADLs, and IADL functioning: including meal preparation, medication management, and was able to drive. Pt. Was assisted with A.M. Bathing/morning care tasks with complete set-up. Pt. required maxA for UE, and LE ADLs. Pt. required min guard to walk to the toilet using a hemi-walker with cues to clear her toes when placing the hemi walker. Pt. required assist the hike her pants on the right side. Pt. education, and husband were provided with education about one armed self-care techniques, positioning of the RUE during a.m. care, and proper brace application, and position. Pt. Has A/E for LE ADLs from previous surgeries. Pt. could benefit from a buttonhook. Pt. could benefit from OT services for ADL training, A/E training, and pt. education about one armed self-dressing techniques, home modification, and DME. Pt. Plans to return home upon discharge with family to assist pt. as needed. Pt. could benefit from follow-up [place] services upon discharge.    Follow Up Recommendations  Home health OT    Equipment Recommendations    Buttonhook   Recommendations for Other Services       Precautions / Restrictions  Precautions Precautions: Fall Restrictions Weight Bearing Restrictions: Yes RUE Weight Bearing: Non weight bearing      Mobility Bed Mobility Overal bed mobility: Modified Independent             General bed mobility comments: Used bed rail to assist  Transfers Overall transfer level: Needs assistance Equipment used: Hemi-walker(verbal cues required as pt. attempts to place the the walker on her toes several times when walking.) Transfers: Sit to/from Stand Sit to Stand: Min guard            Balance                                         ADL either performed or assessed with clinical judgement   ADL Overall ADL's : Needs assistance/impaired Eating/Feeding: Set up;Minimal assistance   Grooming: Set up;Minimal assistance   Upper Body Bathing: Set up;Maximal assistance   Lower Body Bathing: Set up;Maximal assistance   Upper Body Dressing : Set up;Maximal assistance   Lower Body Dressing: Set up;Maximal assistance   Toilet Transfer: Min guard   Toileting- Clothing Manipulation and Hygiene: Set up;Minimal assistance Toileting - Clothing Manipulation Details (indicate cue type and reason): Assist for clothhing negotiation on the left      Functional mobility during ADLs: Min guard       Vision         Perception     Praxis      Pertinent Vitals/Pain Pain Assessment: 0-10 Pain Score: 5  Pain Location: Right shoulder Pain Descriptors / Indicators: Aching;Sore Pain Intervention(s): Premedicated before session;Monitored during session  Hand Dominance Right   Extremity/Trunk Assessment Upper Extremity Assessment Upper Extremity Assessment: Defer to OT evaluation       Communication Communication Communication: No difficulties   Cognition Arousal/Alertness: Awake/alert Behavior During Therapy: WFL for tasks assessed/performed Overall Cognitive Status: Within Functional Limits for tasks assessed                                      General Comments       Exercises   Shoulder Instructions      Home Living Family/patient expects to be discharged to:: Private residence Living Arrangements: Spouse/significant other;Other relatives Available Help at Discharge: Family;Available 24 hours/day Type of Home: House Home Access: Ramped entrance     Home Layout: One level         Bathroom Toilet: Handicapped height     Home Equipment: Environmental consultant - 2 wheels;Cane - single point;Cane - quad;Bedside commode          Prior Functioning/Environment Level of Independence: Independent        Comments: Ind amb without AD, Ind with ADLs, no fall history in the last year         OT Problem List: Decreased strength;Decreased knowledge of use of DME or AE;Pain;Impaired UE functional use;Decreased range of motion      OT Treatment/Interventions: Self-care/ADL training;Therapeutic exercise;Patient/family education;DME and/or AE instruction    OT Goals(Current goals can be found in the care plan section) Acute Rehab OT Goals Patient Stated Goal: To regain independence OT Goal Formulation: With patient Potential to Achieve Goals: Good  OT Frequency: Min 2X/week   Barriers to D/C:            Co-evaluation              AM-PAC PT "6 Clicks" Daily Activity     Outcome Measure Help from another person eating meals?: A Little Help from another person taking care of personal grooming?: A Little Help from another person toileting, which includes using toliet, bedpan, or urinal?: A Little Help from another person bathing (including washing, rinsing, drying)?: A Lot Help from another person to put on and taking off regular upper body clothing?: A Lot Help from another person to put on and taking off regular lower body clothing?: A Lot 6 Click Score: 15   End of Session Equipment Utilized During Treatment: Gait belt  Activity Tolerance: Patient tolerated treatment well Patient left: in  bed;with call bell/phone within reach;with bed alarm set  OT Visit Diagnosis: Unsteadiness on feet (R26.81);Muscle weakness (generalized) (M62.81)                Time: 1000-1130 OT Time Calculation (min): 90 min Charges:  OT General Charges $OT Visit: 1 Visit OT Evaluation $OT Eval High Complexity: 1 High OT Treatments $Self Care/Home Management : 23-37 mins  Harrel Carina, MS, OTR/L   Harrel Carina 11/05/2017, 12:42 PM

## 2017-11-06 ENCOUNTER — Other Ambulatory Visit: Payer: Self-pay | Admitting: Psychiatry

## 2017-11-08 ENCOUNTER — Ambulatory Visit: Payer: Medicare Other | Admitting: Psychiatry

## 2017-11-08 LAB — SURGICAL PATHOLOGY

## 2017-11-08 MED ORDER — ESCITALOPRAM OXALATE 10 MG PO TABS: 10.0000 mg | ORAL_TABLET | Freq: Every day | ORAL | 0 refills | Status: DC

## 2017-11-08 MED ORDER — QUETIAPINE FUMARATE 25 MG PO TABS: 25.0000 mg | ORAL_TABLET | Freq: Every day | ORAL | 0 refills | Status: DC

## 2017-11-08 NOTE — Telephone Encounter (Signed)
Refilled 1 month supply. Please advise pt to make follow up appointment for med adjustment.

## 2017-11-11 ENCOUNTER — Telehealth: Payer: Self-pay

## 2017-11-11 NOTE — Telephone Encounter (Signed)
EMMI Follow-up: [name] called me as she had just missed a call from my number.  I explained our process of two automated calls post discharge and she said everything was going well and no needs noted.

## 2017-11-30 ENCOUNTER — Telehealth: Payer: Self-pay | Admitting: Internal Medicine

## 2017-11-30 NOTE — Telephone Encounter (Signed)
Advise pt she can stop weekly vitamin D3 and take OTC 2000-5000 IU daily

## 2017-12-01 NOTE — Telephone Encounter (Signed)
Patient has been informed.

## 2017-12-07 ENCOUNTER — Encounter: Payer: Self-pay | Admitting: Internal Medicine

## 2017-12-11 IMAGING — DX DG HIP (WITH OR WITHOUT PELVIS) 2-3V*R*
2 series · 2 of 2 positions shown · non-contrast
Comparison: Right hip radiographs from earlier today

CLINICAL DATA: Status post reduction of right hip dislocation

EXAM:
DG HIP (WITH OR WITHOUT PELVIS) 2-3V RIGHT

[pelvis ap]
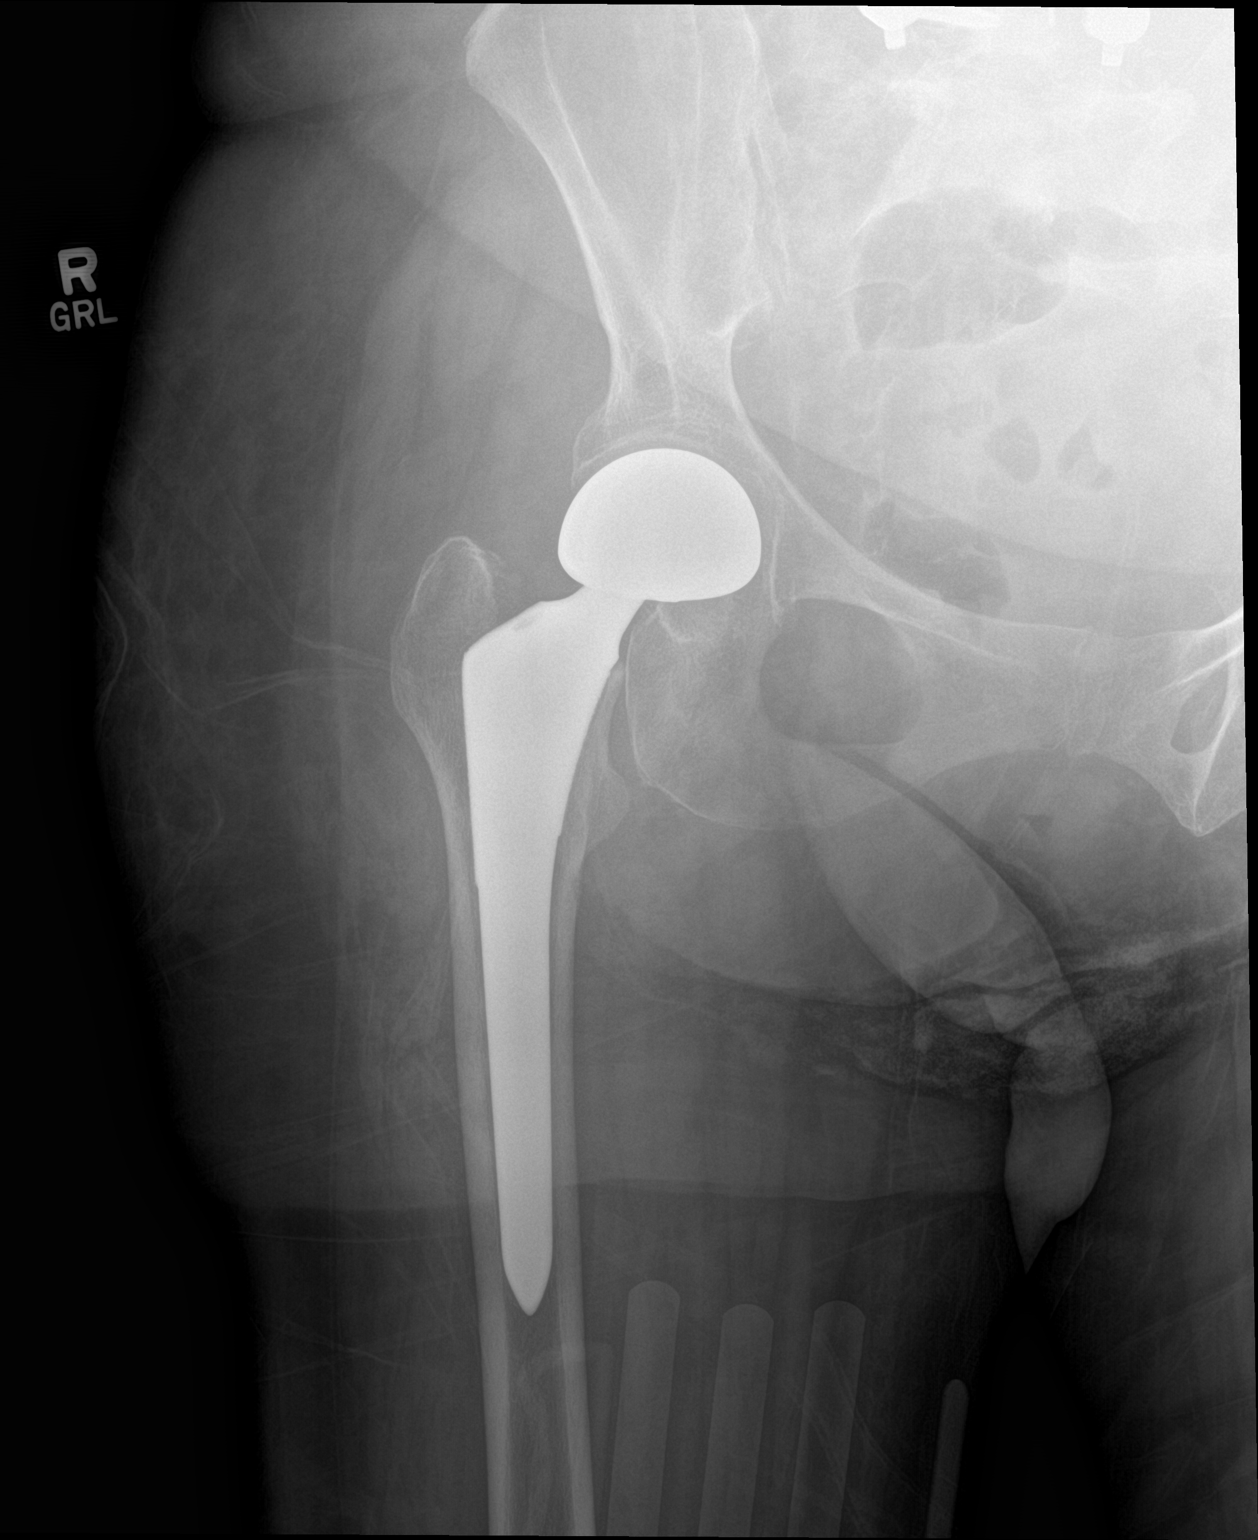

[hip lat]
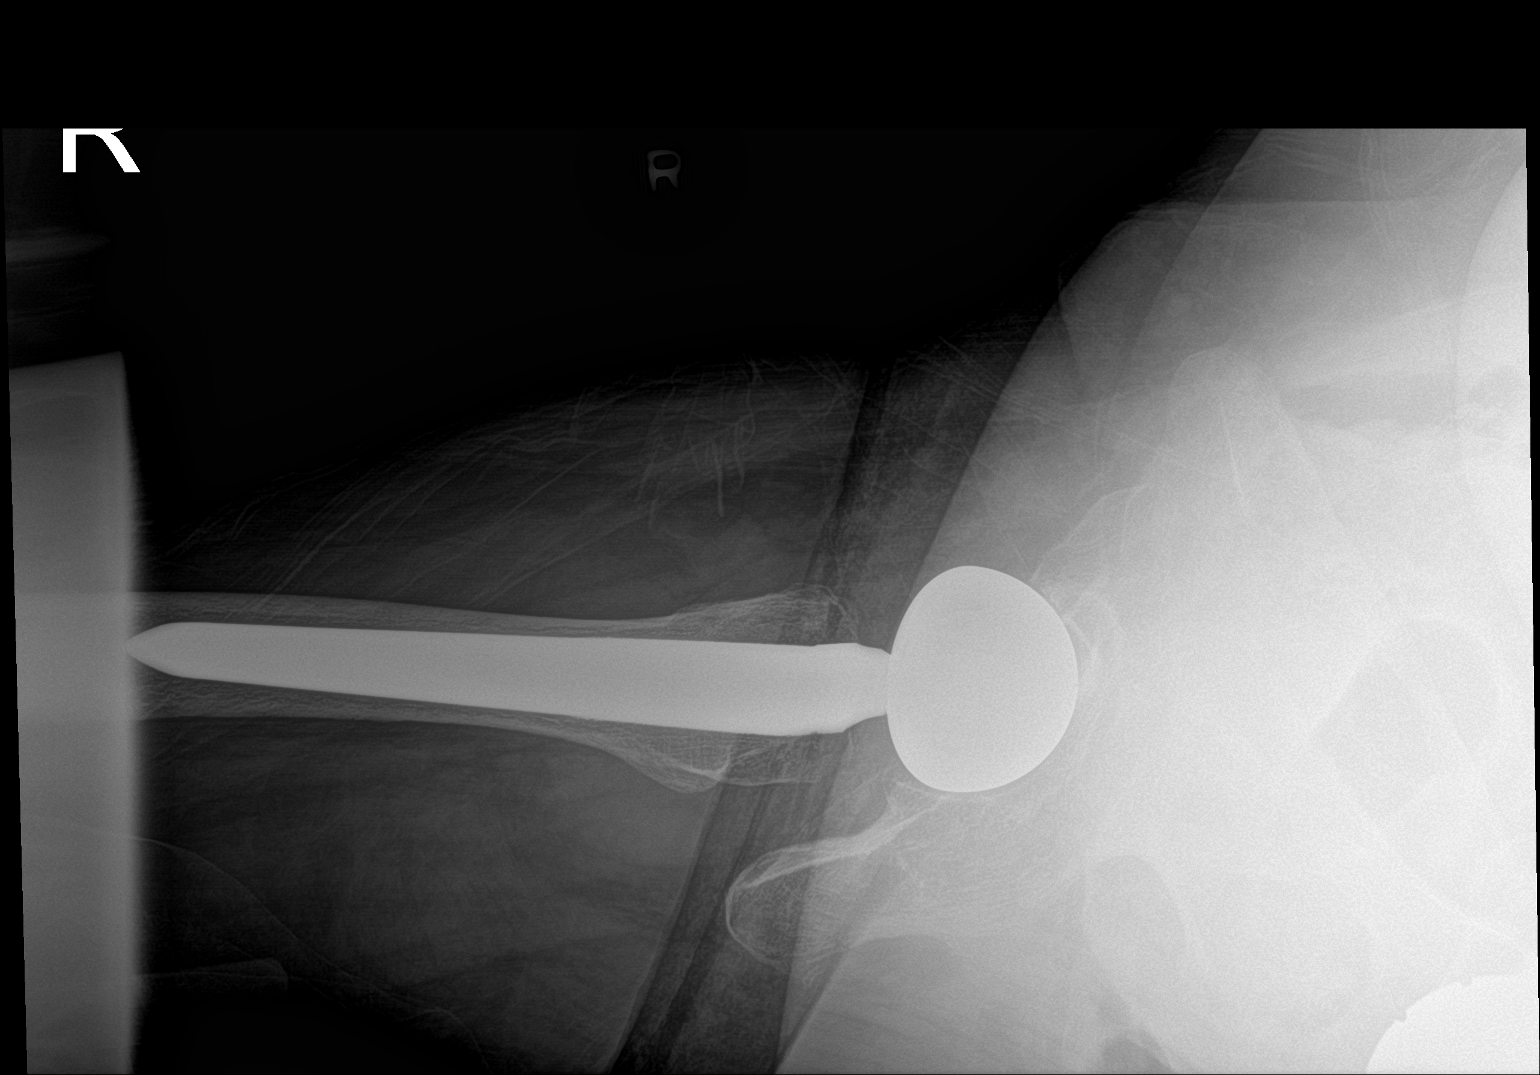

[2 of 2 positions shown; findings below may reference images not displayed]

FINDINGS: Successful reduction of the right hip dislocation, with no residual
malalignment at the right hip. Status post right hip
hemiarthroplasty. No evidence of hardware fracture or loosening. No
osseous fracture. No suspicious focal osseous lesions.
IMPRESSION: Successful reduction of right hip dislocation, with no residual
right hip malalignment. No osseous fracture. Right hip
hemiarthroplasty.

## 2017-12-11 IMAGING — CR DG HIP (WITH OR WITHOUT PELVIS) 2-3V*R*
3 series · 3 of 3 positions shown · non-contrast
Comparison: 11/01/2016

CLINICAL DATA: Per EMS report, patient felt her right hip "give
way" and lowered herself to the floor. Patient states she did not
fall. Patient was discharged from rehab 2-3 weeks ago after right
hip replacement.

EXAM:
DG HIP (WITH OR WITHOUT PELVIS) 2-3V RIGHT

[pelvis ap]
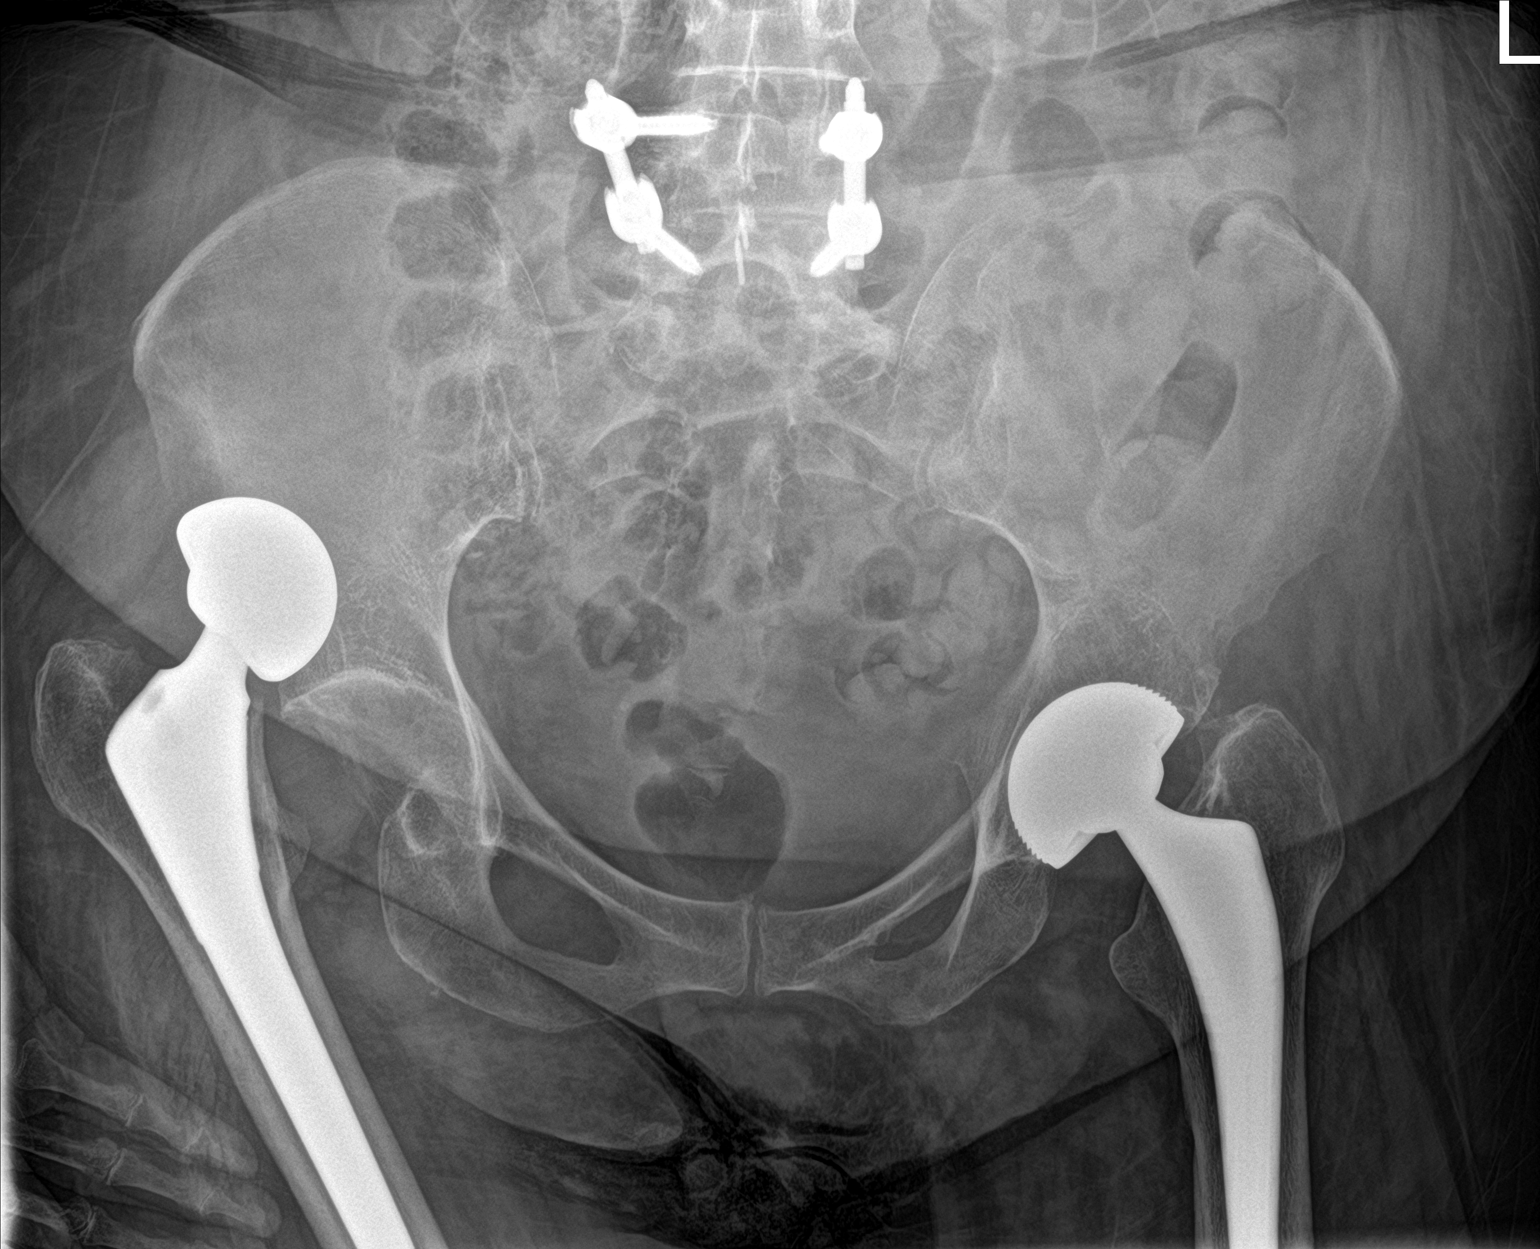

[hip ap]
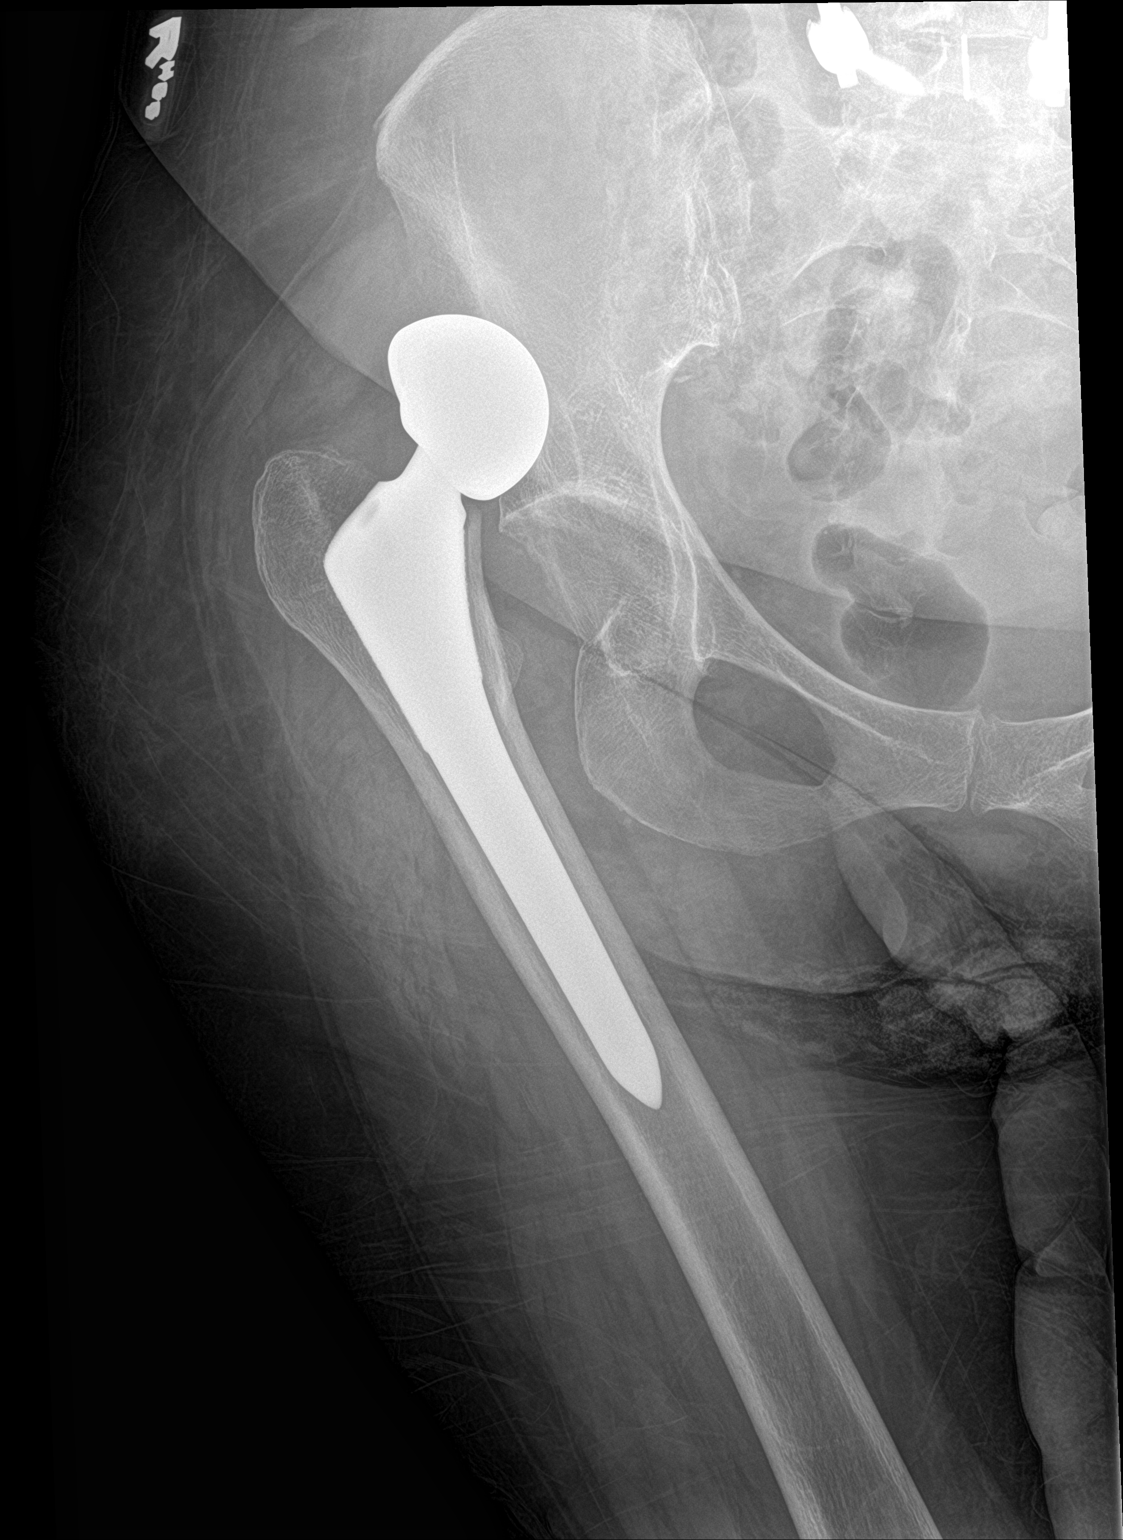

[hip lat]
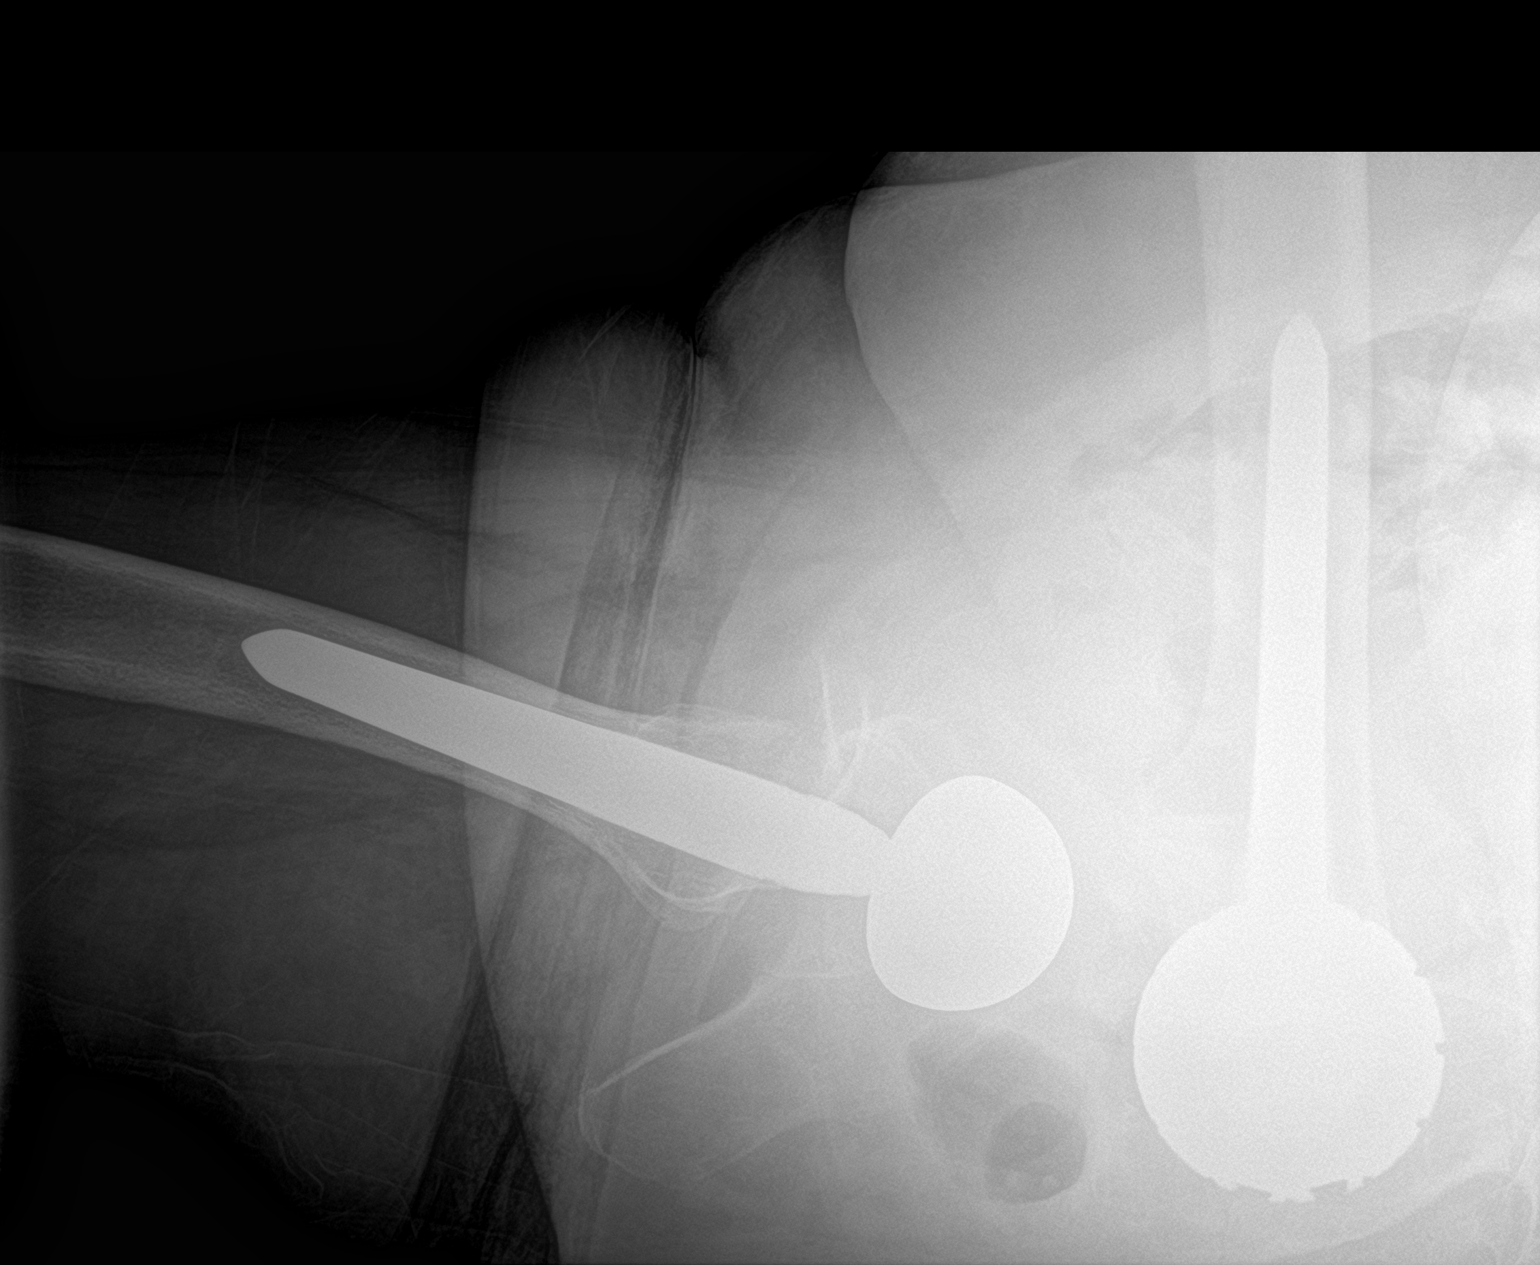

[3 of 3 positions shown; findings below may reference images not displayed]

FINDINGS: Superior dislocation of the RIGHT hip hemiarthroplasty. No evidence
of acetabular fracture or femur fracture.
IMPRESSION: Superior dislocation of the RIGHT femur hemiarthroplasty.

## 2017-12-11 IMAGING — CR DG FEMUR 2+V*R*
4 series · 4 of 4 positions shown · non-contrast
Comparison: None.

CLINICAL DATA: New hip dislocation

EXAM:
RIGHT FEMUR 2 VIEWS

[hip ap]
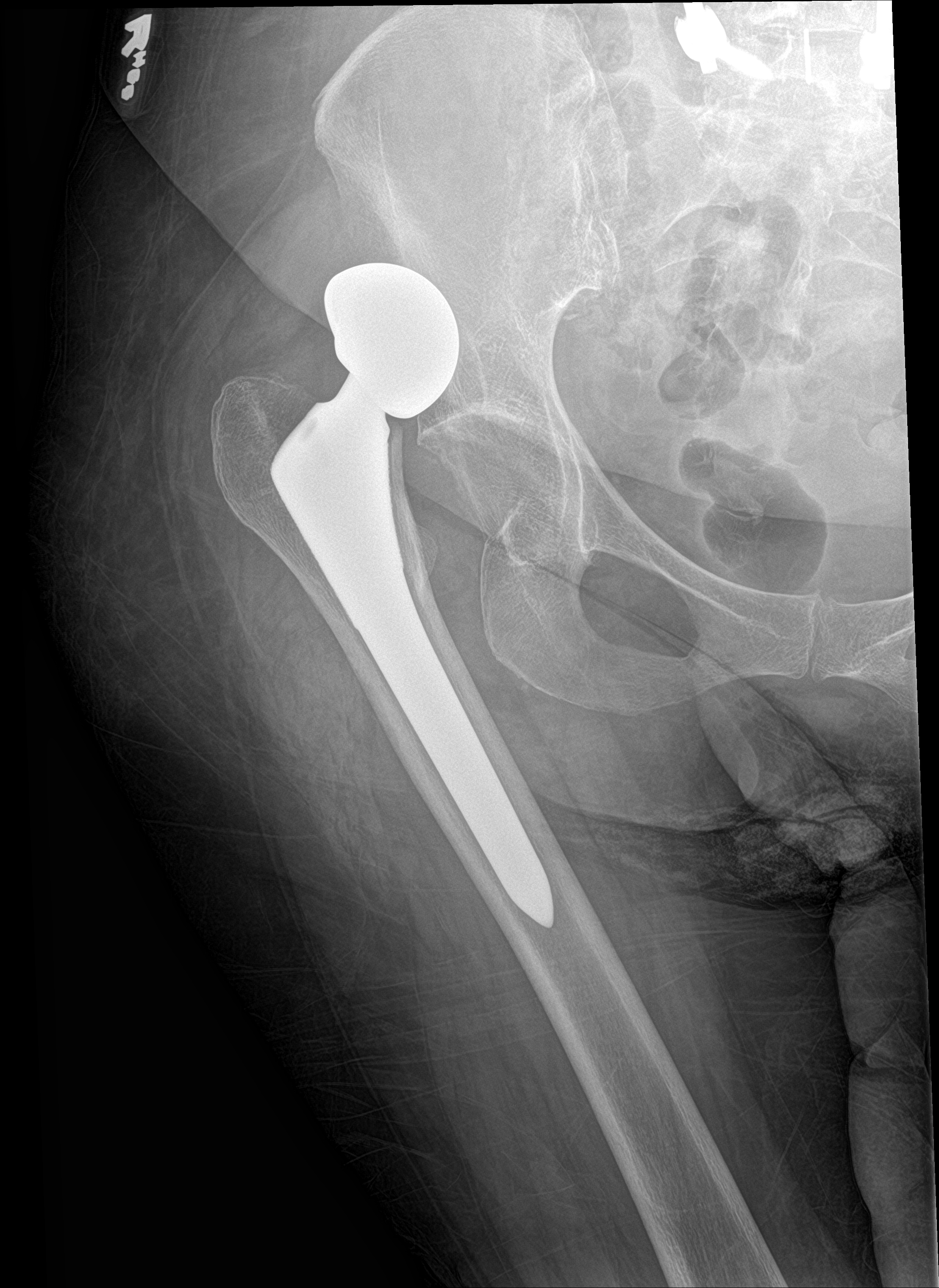

[hip lat]
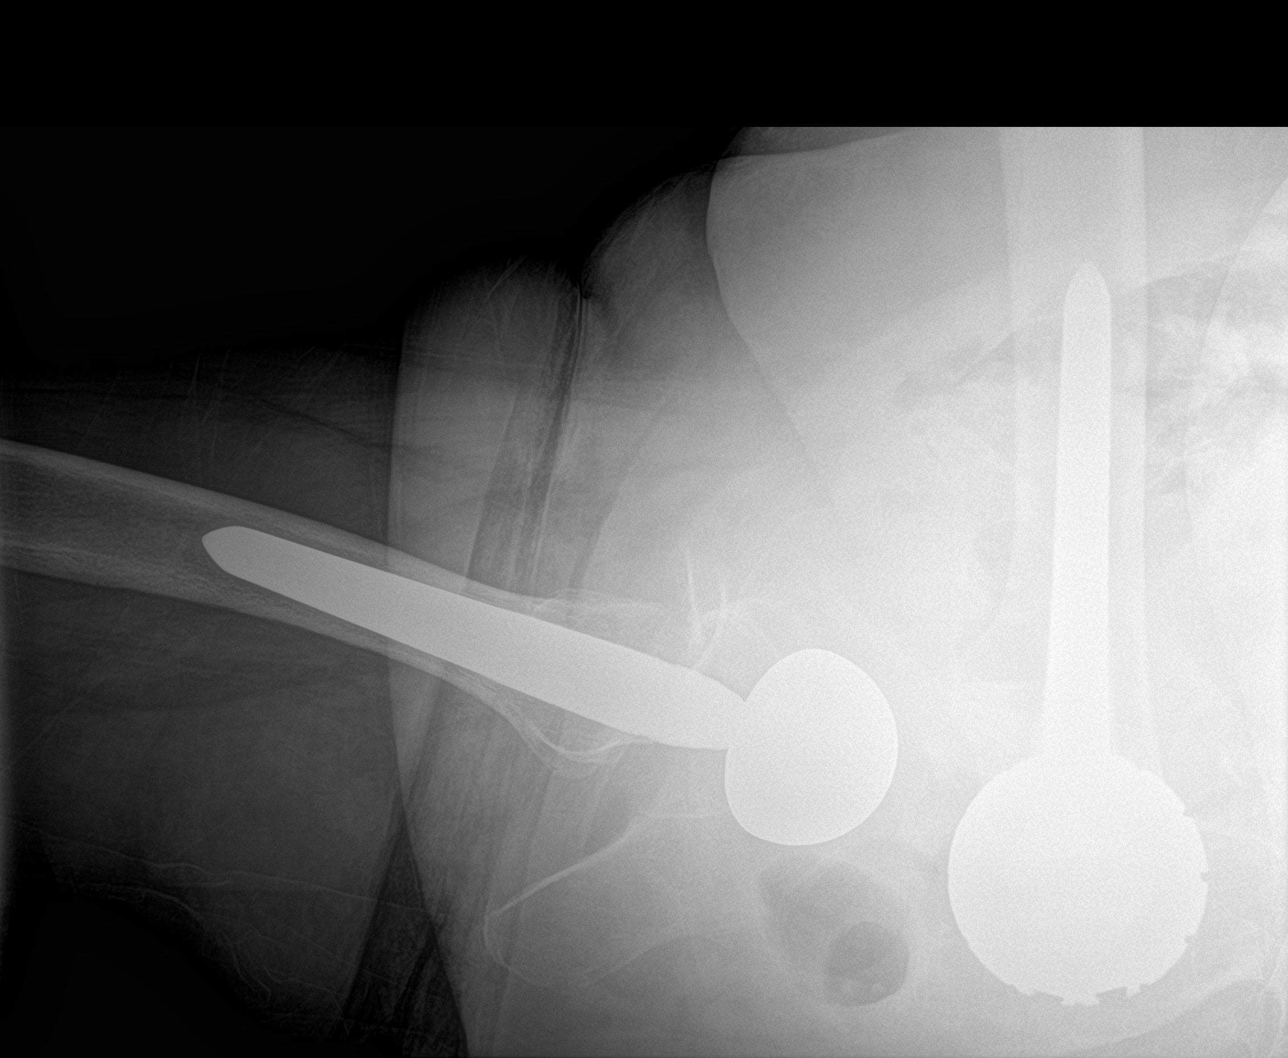

[femur ap]
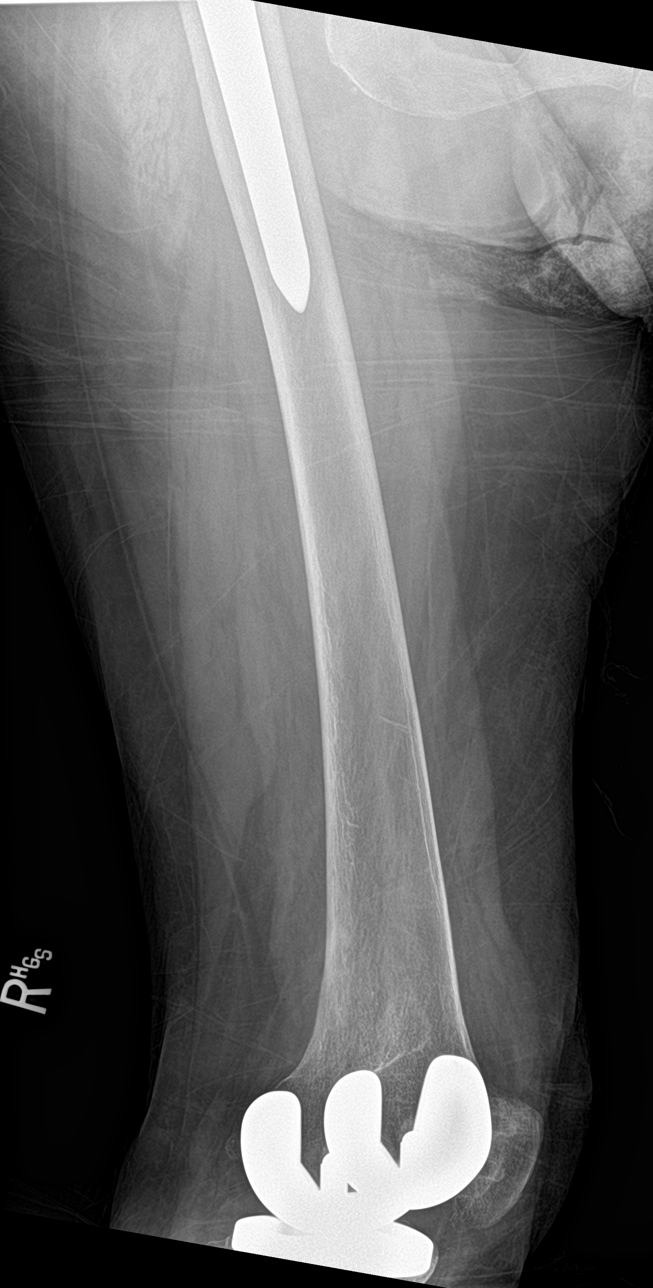

[femur lat]
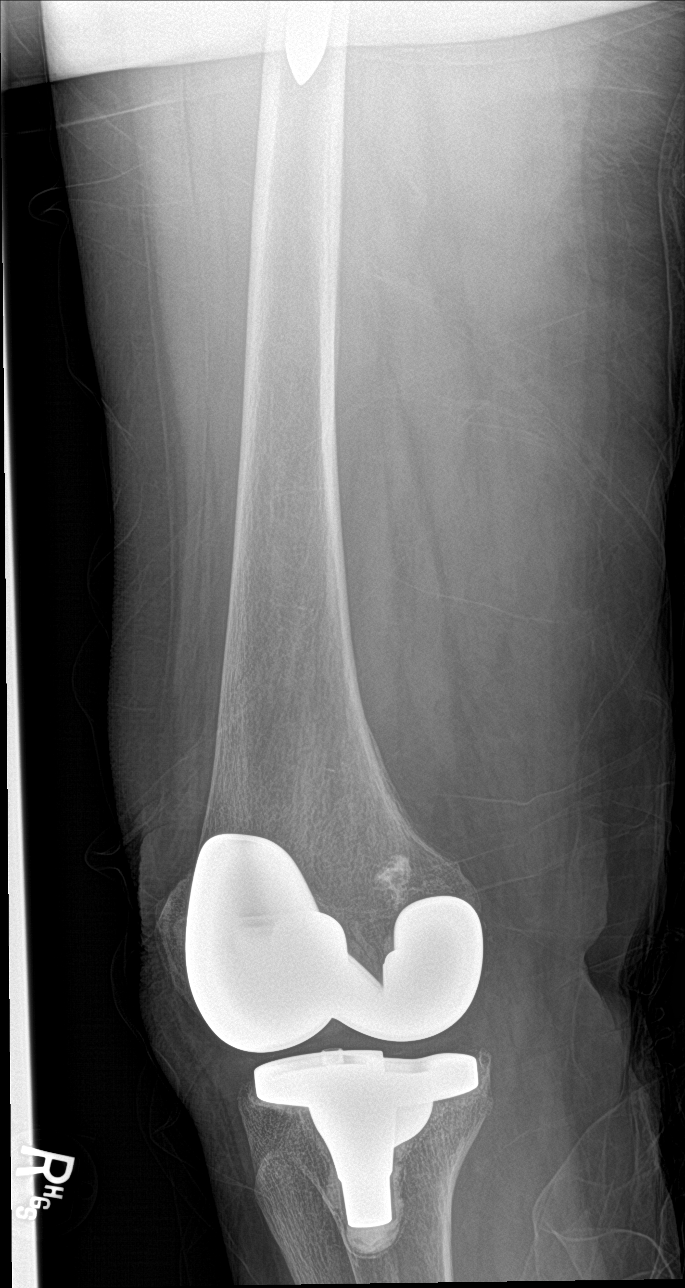

[4 of 4 positions shown; findings below may reference images not displayed]

FINDINGS: Severe dislocation of the RIGHT femur hemiarthroplasty from the
acetabulum. No evidence of femur fracture. No acetabular fracture
evident.
IMPRESSION: Superior dislocation of the RIGHT femoral hemiarthroplasty

## 2017-12-14 IMAGING — DX DG CHEST 2V
2 series · 2 of 2 positions shown · non-contrast
Comparison: Chest radiograph 10/31/2016 CT abdomen 06/12/2016

CLINICAL DATA: Cough and congestion

EXAM:
CHEST  2 VIEW

[chest pa]
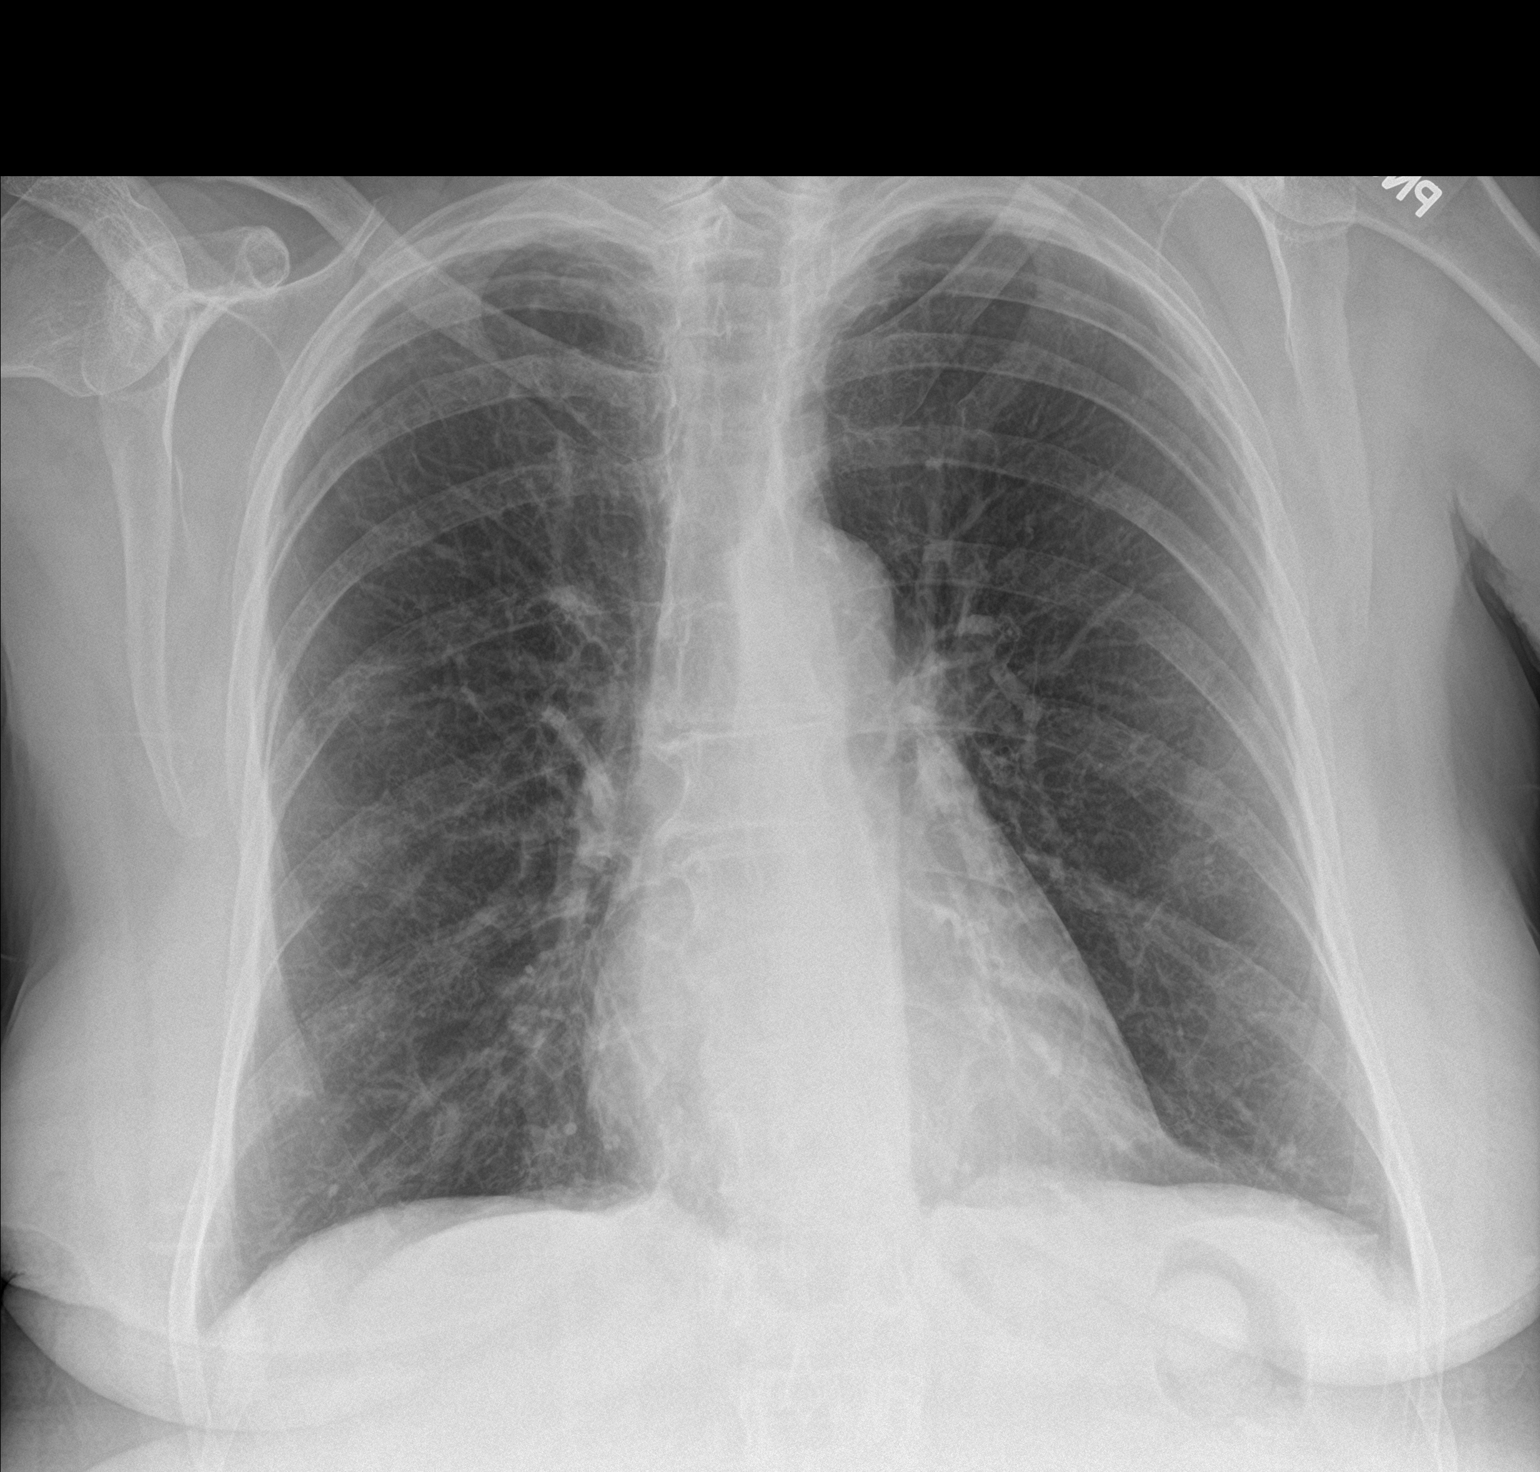

[chest lat]
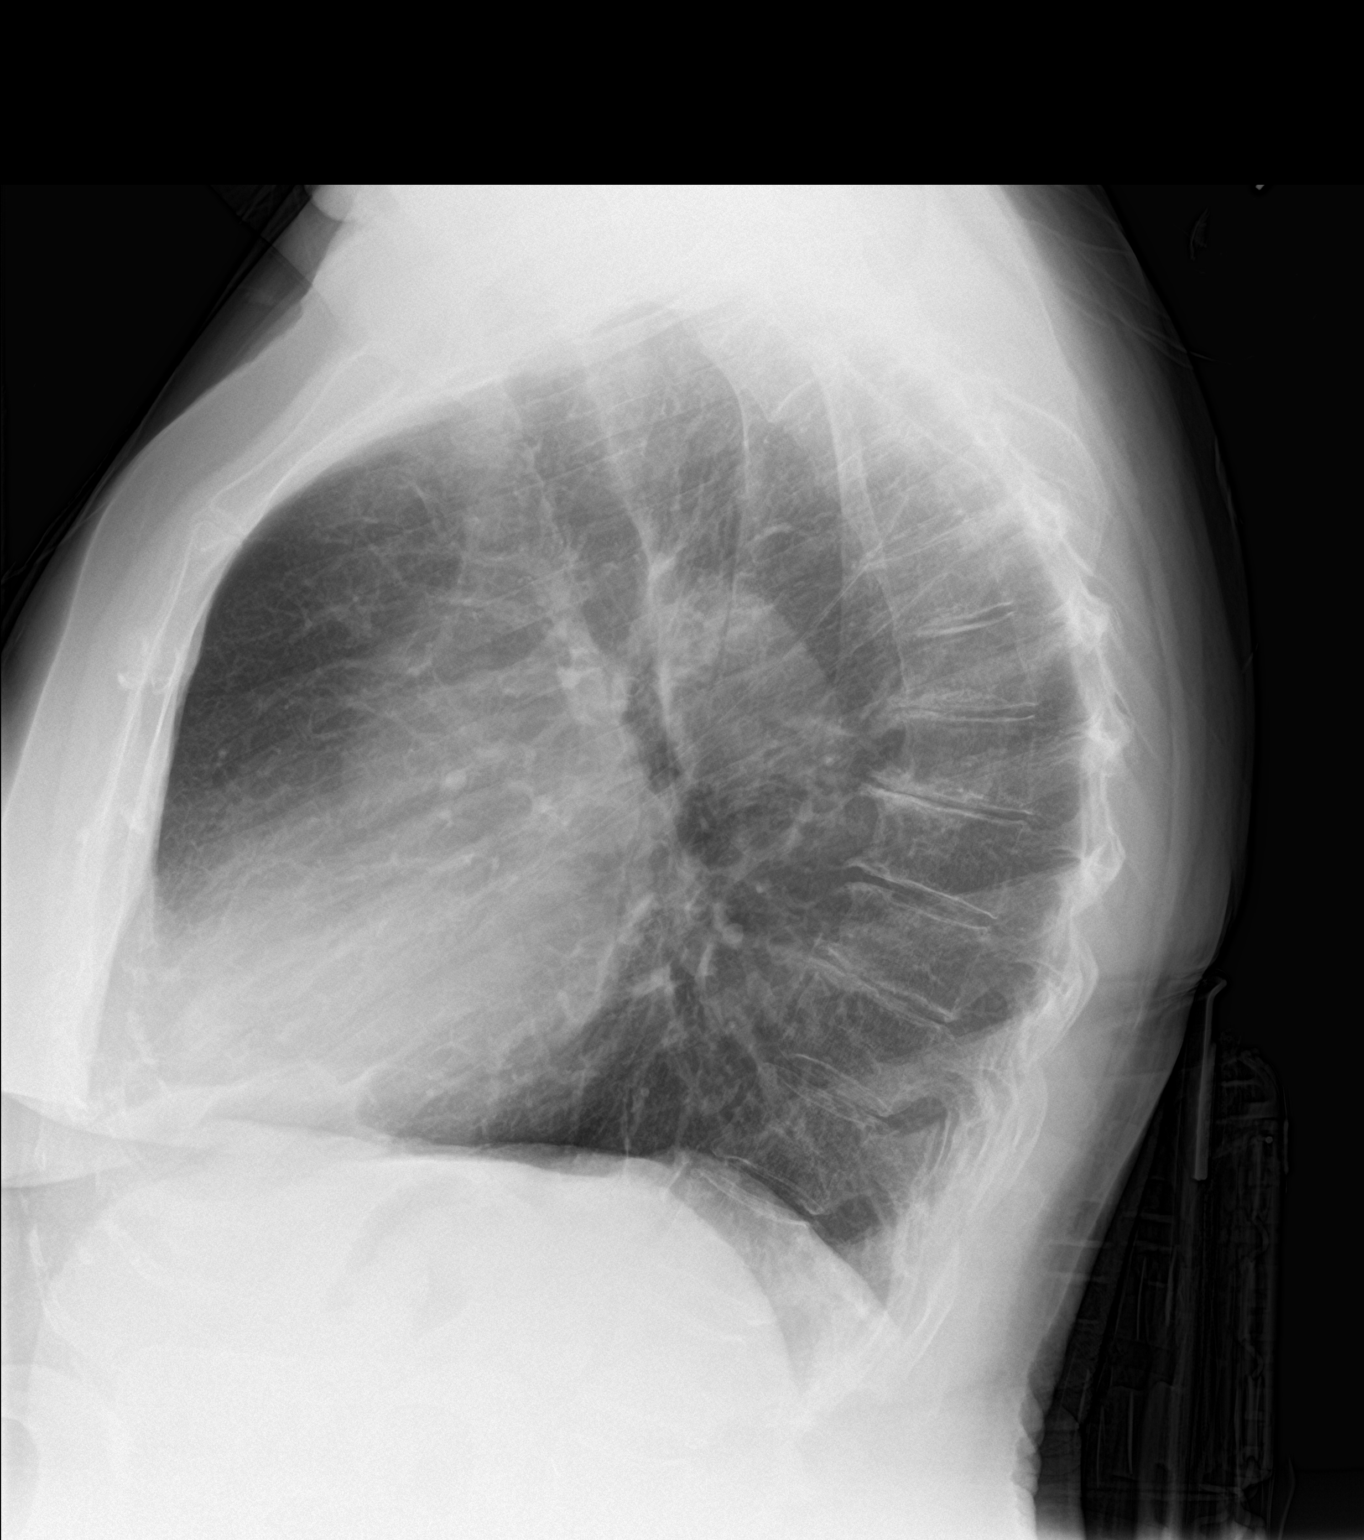

[2 of 2 positions shown; findings below may reference images not displayed]

FINDINGS: Normal mediastinum and cardiac silhouette. Chronic central
bronchitic markings. Normal pulmonary vasculature. No effusion,
infiltrate, or pneumothorax.
IMPRESSION: Chronic bronchitic markings. Mild hyperinflated lungs. No acute
findings.

## 2017-12-20 ENCOUNTER — Other Ambulatory Visit: Payer: Self-pay | Admitting: Psychiatry

## 2017-12-20 ENCOUNTER — Telehealth: Payer: Self-pay | Admitting: Internal Medicine

## 2017-12-20 DIAGNOSIS — M7582 Other shoulder lesions, left shoulder: Secondary | ICD-10-CM | POA: Insufficient documentation

## 2017-12-20 HISTORY — DX: Other shoulder lesions, left shoulder: M75.82

## 2017-12-20 NOTE — Telephone Encounter (Signed)
Please advise pt to make appt for follow up in 2 weeks for refill of meds

## 2017-12-20 NOTE — Telephone Encounter (Signed)
Call pt rec stop zantac/ranitidine it has been recalled she can take pepcid otc for reflux   Kerri Carter

## 2017-12-21 ENCOUNTER — Other Ambulatory Visit: Payer: Self-pay | Admitting: Surgery

## 2017-12-21 DIAGNOSIS — M25512 Pain in left shoulder: Secondary | ICD-10-CM

## 2017-12-21 NOTE — Telephone Encounter (Signed)
Left voice mail for patient to call back ok for P[facility] to speak to patient , regarding below message

## 2017-12-22 ENCOUNTER — Other Ambulatory Visit: Payer: Self-pay | Admitting: Internal Medicine

## 2017-12-22 DIAGNOSIS — L304 Erythema intertrigo: Secondary | ICD-10-CM

## 2017-12-22 MED ORDER — CLOTRIMAZOLE 1 % EX CREA: 1.0000 "application " | TOPICAL_CREAM | Freq: Two times a day (BID) | CUTANEOUS | 11 refills | Status: DC | PRN

## 2017-12-22 NOTE — Telephone Encounter (Signed)
Pt received letter from the office to discontinue zantac. She said that she would like a prescription medication. She said she takes this for her stomach and allergies. Her husband is taking prilosec 40mg  by RX that is covered by insurance. She said that it is more expensive OTC and that pepcid is not strong enough for her. Pt is asking for alternatives. Please advise.   Pt also asking for clotrimazole cream USP 1%. She is wanting a refill on this medication.  [id number] - [name], [place] - [address]  Ph# [phone]

## 2017-12-22 NOTE — Telephone Encounter (Signed)
Please advise on alternative.  

## 2017-12-22 NOTE — Telephone Encounter (Signed)
She is already on protonix  If not working we can try 2x per day x 2-3 months protonix but this medication 2x per day is not good long term can have side effects   OR  Take protonix in am and pepcid at night   If that not better rec f/u with GI  What does she want to do?   [place]

## 2017-12-24 ENCOUNTER — Other Ambulatory Visit: Payer: Self-pay | Admitting: Surgery

## 2017-12-24 DIAGNOSIS — M7581 Other shoulder lesions, right shoulder: Secondary | ICD-10-CM

## 2017-12-24 DIAGNOSIS — T380X5A Adverse effect of glucocorticoids and synthetic analogues, initial encounter: Secondary | ICD-10-CM

## 2017-12-24 DIAGNOSIS — M87112 Osteonecrosis due to drugs, left shoulder: Secondary | ICD-10-CM

## 2017-12-24 DIAGNOSIS — M25512 Pain in left shoulder: Secondary | ICD-10-CM

## 2017-12-24 NOTE — Telephone Encounter (Signed)
Left message for patient to return call back. P[facility] may give and obtain information.  

## 2017-12-24 NOTE — Telephone Encounter (Signed)
Pt states she wants to start back taking Protonix 2x per day and would need a new prescription to be sent in for Protonix with instructions to take 2x a day. Pt would [facility].  Attempted to return call to pt to make pt aware that [facility] cream [facility] available [facility] in [facility], call answered but no one verbally answered the phone.

## 2017-12-27 ENCOUNTER — Other Ambulatory Visit: Payer: Self-pay

## 2017-12-27 DIAGNOSIS — K219 Gastro-esophageal reflux disease without esophagitis: Secondary | ICD-10-CM

## 2017-12-27 MED ORDER — PANTOPRAZOLE SODIUM 40 MG PO TBEC: 40.0000 mg | DELAYED_RELEASE_TABLET | Freq: Two times a day (BID) | ORAL | 3 refills | Status: DC

## 2017-12-27 MED ORDER — PANTOPRAZOLE SODIUM 40 MG PO TBEC: 40.0000 mg | DELAYED_RELEASE_TABLET | Freq: Every day | ORAL | 3 refills | Status: DC

## 2017-12-27 NOTE — Telephone Encounter (Signed)
I have sent protonix BID for patient to [place].

## 2018-01-03 ENCOUNTER — Other Ambulatory Visit: Payer: Self-pay | Admitting: Psychiatry

## 2018-01-05 ENCOUNTER — Ambulatory Visit
Admission: RE | Admit: 2018-01-05 | Discharge: 2018-01-05 | Disposition: A | Discharge status: Home / Self Care | Payer: Medicare Other | Source: Ambulatory Visit | Attending: Surgery | Admitting: Surgery

## 2018-01-05 DIAGNOSIS — M25412 Effusion, left shoulder: Secondary | ICD-10-CM | POA: Diagnosis not present

## 2018-01-05 DIAGNOSIS — M75122 Complete rotator cuff tear or rupture of left shoulder, not specified as traumatic: Secondary | ICD-10-CM | POA: Insufficient documentation

## 2018-01-05 DIAGNOSIS — M659 Synovitis and tenosynovitis, unspecified: Secondary | ICD-10-CM | POA: Diagnosis not present

## 2018-01-05 DIAGNOSIS — M87112 Osteonecrosis due to drugs, left shoulder: Secondary | ICD-10-CM

## 2018-01-05 DIAGNOSIS — M25512 Pain in left shoulder: Secondary | ICD-10-CM

## 2018-01-05 DIAGNOSIS — T380X5A Adverse effect of glucocorticoids and synthetic analogues, initial encounter: Secondary | ICD-10-CM | POA: Insufficient documentation

## 2018-01-05 DIAGNOSIS — M7581 Other shoulder lesions, right shoulder: Secondary | ICD-10-CM

## 2018-01-06 ENCOUNTER — Telehealth: Payer: Self-pay | Admitting: Internal Medicine

## 2018-01-06 ENCOUNTER — Other Ambulatory Visit: Payer: Self-pay | Admitting: Internal Medicine

## 2018-01-06 NOTE — Telephone Encounter (Signed)
D/c weekly D3 and take D3 5000 iu daily otc inform pt  And  Pharmacy

## 2018-01-10 NOTE — Telephone Encounter (Signed)
Left message for patient to return call back. P[facility] may give information.  

## 2018-01-11 NOTE — Telephone Encounter (Signed)
Patient called, left VM to return call to the office for a message from Dr. Terese Door.

## 2018-01-11 NOTE — Telephone Encounter (Signed)
Patient notified of provider recommendation

## 2018-01-18 IMAGING — MG MM DIGITAL SCREENING BILAT W/ CAD
5 series · 5 of 5 positions shown · non-contrast
Comparison: Previous exam(s).

CLINICAL DATA: Screening.

EXAM:
DIGITAL SCREENING BILATERAL MAMMOGRAM WITH CAD

[L CC]
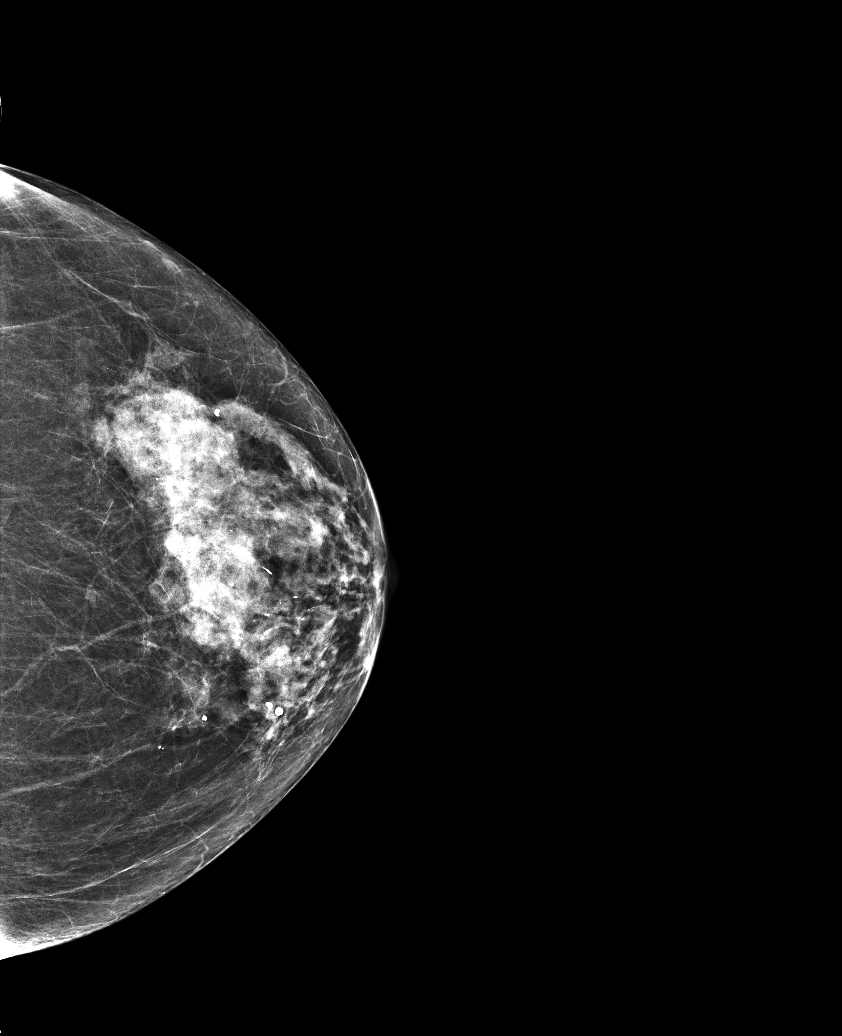

[L MLO]
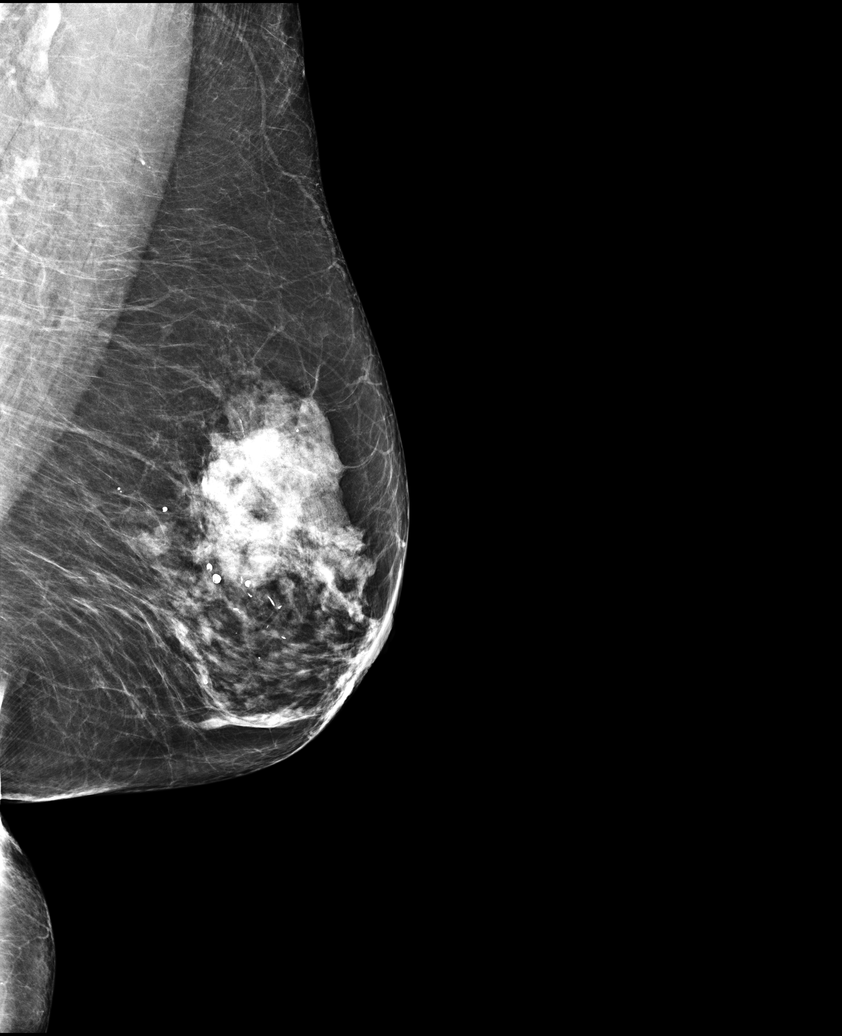

[R CC]
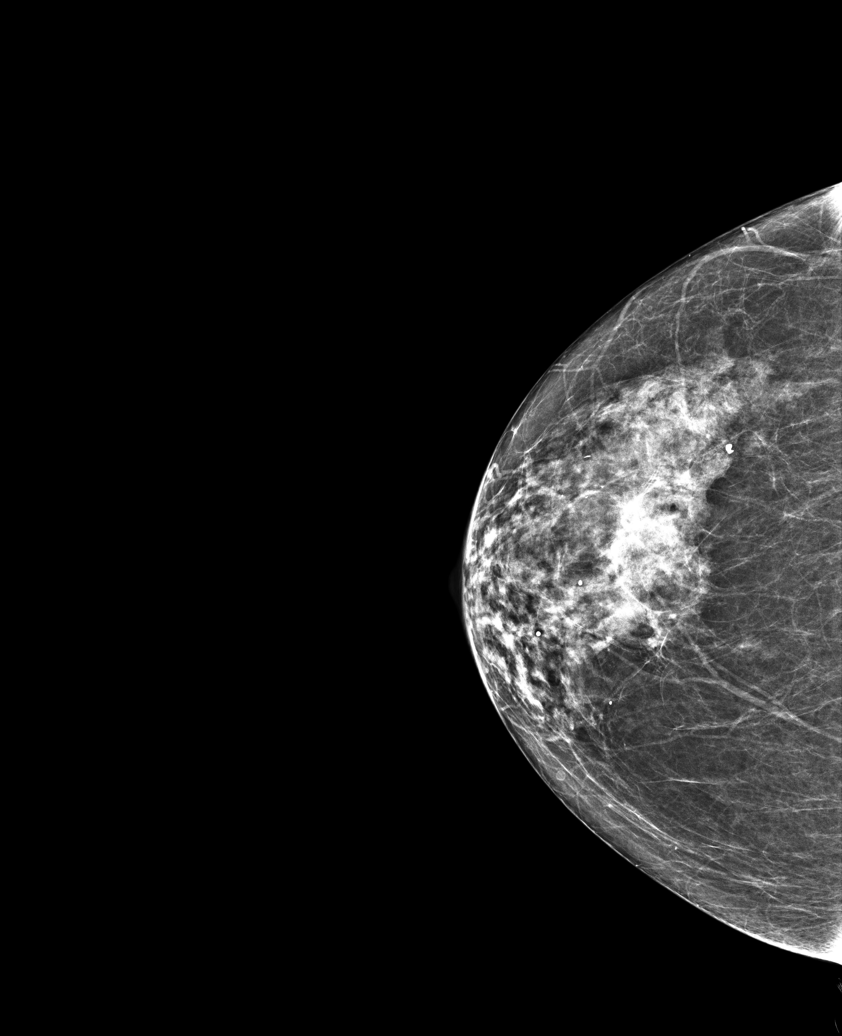

[R MLO (1 of 2)]
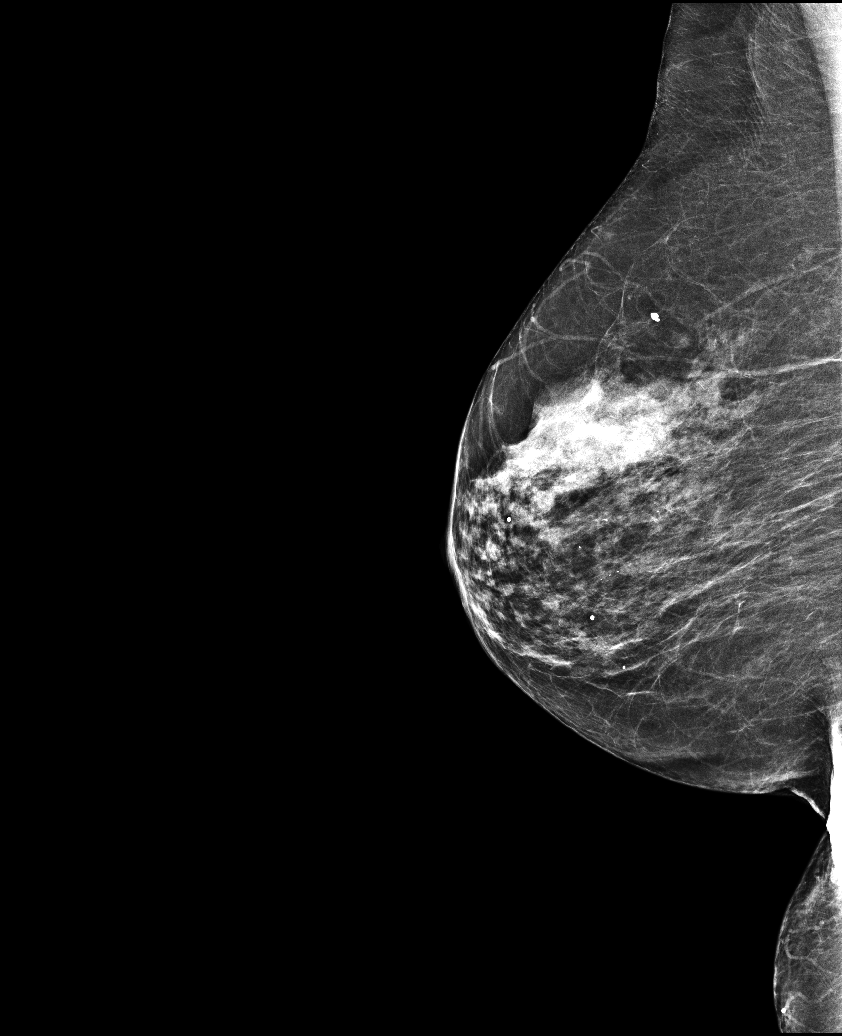

[R MLO (2 of 2)]
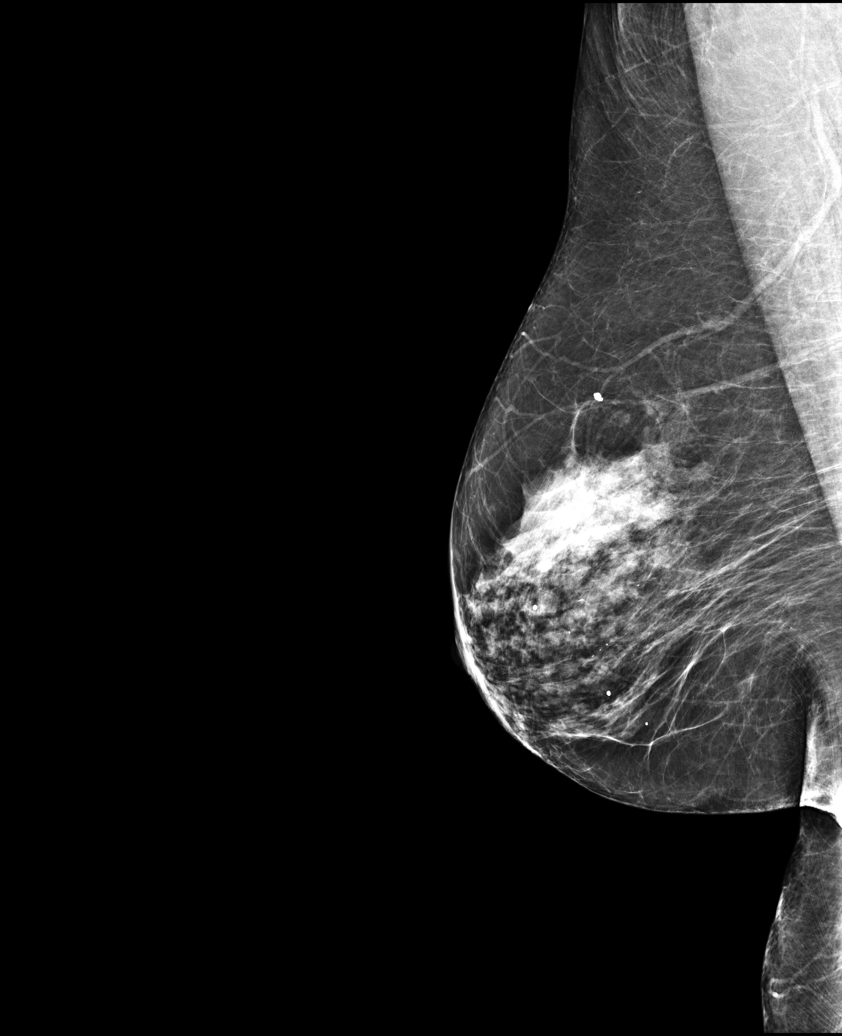

[5 of 5 positions shown; findings below may reference images not displayed]

ACR Breast Density Category c: The breast tissue is heterogeneously
dense, which may obscure small masses.
FINDINGS: There are no findings suspicious for malignancy. Images were
processed with CAD.
IMPRESSION: No mammographic evidence of malignancy. A result letter of this
screening mammogram will be mailed directly to the patient.

RECOMMENDATION:
Screening mammogram in one year. (Code:[id number])

BI-RADS CATEGORY  1: Negative.

## 2018-01-20 ENCOUNTER — Ambulatory Visit: Payer: Medicare Other | Admitting: Internal Medicine

## 2018-01-20 ENCOUNTER — Encounter: Payer: Self-pay | Admitting: Internal Medicine

## 2018-01-20 VITALS — BP 130/78 | HR 78 | Temp 97.8°F | Ht 61.0 in | Wt 174.6 lb

## 2018-01-20 DIAGNOSIS — M87112 Osteonecrosis due to drugs, left shoulder: Secondary | ICD-10-CM

## 2018-01-20 DIAGNOSIS — T380X5A Adverse effect of glucocorticoids and synthetic analogues, initial encounter: Secondary | ICD-10-CM | POA: Insufficient documentation

## 2018-01-20 DIAGNOSIS — F32A Depression, unspecified: Secondary | ICD-10-CM

## 2018-01-20 DIAGNOSIS — D72829 Elevated white blood cell count, unspecified: Secondary | ICD-10-CM | POA: Diagnosis not present

## 2018-01-20 DIAGNOSIS — F419 Anxiety disorder, unspecified: Secondary | ICD-10-CM | POA: Diagnosis not present

## 2018-01-20 DIAGNOSIS — M810 Age-related osteoporosis without current pathological fracture: Secondary | ICD-10-CM

## 2018-01-20 DIAGNOSIS — M67912 Unspecified disorder of synovium and tendon, left shoulder: Secondary | ICD-10-CM | POA: Diagnosis not present

## 2018-01-20 DIAGNOSIS — E785 Hyperlipidemia, unspecified: Secondary | ICD-10-CM

## 2018-01-20 DIAGNOSIS — K219 Gastro-esophageal reflux disease without esophagitis: Secondary | ICD-10-CM

## 2018-01-20 DIAGNOSIS — H6121 Impacted cerumen, right ear: Secondary | ICD-10-CM

## 2018-01-20 DIAGNOSIS — F329 Major depressive disorder, single episode, unspecified: Secondary | ICD-10-CM

## 2018-01-20 DIAGNOSIS — Z96611 Presence of right artificial shoulder joint: Secondary | ICD-10-CM

## 2018-01-20 DIAGNOSIS — J441 Chronic obstructive pulmonary disease with (acute) exacerbation: Secondary | ICD-10-CM

## 2018-01-20 HISTORY — DX: Adverse effect of glucocorticoids and synthetic analogues, initial encounter: M87.112

## 2018-01-20 MED ORDER — PANTOPRAZOLE SODIUM 40 MG PO TBEC: 40.0000 mg | DELAYED_RELEASE_TABLET | Freq: Every day | ORAL | 3 refills | Status: DC

## 2018-01-20 MED ORDER — FAMOTIDINE 20 MG PO TABS: 20.0000 mg | ORAL_TABLET | Freq: Every day | ORAL | 3 refills | Status: DC

## 2018-01-20 MED ORDER — ESCITALOPRAM OXALATE 10 MG PO TABS: 10.0000 mg | ORAL_TABLET | Freq: Every day | ORAL | 3 refills | Status: DC

## 2018-01-20 NOTE — Progress Notes (Signed)
Pre visit review using our clinic review tool, if applicable. No additional management support is needed unless otherwise documented below in the visit note. 

## 2018-01-20 NOTE — Patient Instructions (Addendum)
Pepcid AC in am and protonix in PM  Debrox ear wax drops 4-7 day 1x per month  I will speak to Dr. Jefm Bryant and [facility] about your bones    Osteoporosis  Osteoporosis is thinning and loss of density in your bones. Osteoporosis makes bones more brittle and fragile and more likely to break (fracture). Over time, osteoporosis can cause your bones to become so weak that they fracture after a minor fall. Bones in the hip, wrist, and spine are most likely to fracture due to osteoporosis. What are the causes? The exact cause of this condition is not known. What increases the risk? You may be at greater risk for osteoporosis if you:  Have a family history of the condition.  Have poor nutrition.  Use steroid medicines, such as prednisone.  Are female.  Are age 62 or older.  Smoke or have a history of smoking.  Are not physically active (are sedentary).  Are white (Caucasian) or of Asian descent.  Have a small body frame.  Take certain medicines, such as antiseizure medicines. What are the signs or symptoms? A fracture might be the first sign of osteoporosis, especially if the fracture results from a fall or injury that usually would not cause a bone to break. Other signs and symptoms include:  Pain in the neck or low back.  Stooped posture.  Loss of height. How is this diagnosed? This condition may be diagnosed based on:  Your medical history.  A physical exam.  A bone mineral density test, also called a DXA or DEXA test (dual-energy X-ray absorptiometry test). This test uses X-rays to measure the amount of minerals in your bones. How is this treated? The goal of treatment is to strengthen your bones and lower your risk for a fracture. Treatment may involve:  Making lifestyle changes, such as: ? Including foods with more calcium and vitamin D in your diet. ? Doing weight-bearing and muscle-strengthening exercises. ? Stopping tobacco use. ? Limiting alcohol  intake.  Taking medicine to slow the process of bone loss or to increase bone density.  Taking daily supplements of calcium and vitamin D.  Taking hormone replacement medicines, such as estrogen for women and testosterone for men.  Monitoring your levels of calcium and vitamin D. Follow these instructions at home:  Activity  Exercise as told by your health care provider. Ask your health care provider what exercises and activities are safe for you. You should do: ? Exercises that make you work against gravity (weight-bearing exercises), such as tai chi, yoga, or walking. ? Exercises to strengthen muscles, such as lifting weights. Lifestyle  Limit alcohol intake to no more than 1 drink a day for nonpregnant women and 2 drinks a day for men. One drink equals 12 oz of beer, 5 oz of wine, or 1 oz of hard liquor.  Do not use any products that contain nicotine or tobacco, such as cigarettes and e-cigarettes. If you need help quitting, ask your health care provider. Preventing falls  Use devices to help you move around (mobility aids) as needed, such as canes, walkers, scooters, or crutches.  Keep rooms well-lit and clutter-free.  Remove tripping hazards from walkways, including cords and throw rugs.  Install grab bars in bathrooms and safety rails on stairs.  Use rubber mats in the bathroom and other areas that are often wet or slippery.  Wear closed-toe shoes that fit well and support your feet. Wear shoes that have rubber soles or low heels.  Review your medicines with your health care provider. Some medicines can cause dizziness or changes in blood pressure, which can increase your risk of falling. General instructions  Include calcium and vitamin D in your diet. Calcium is important for bone health, and vitamin D helps your body to absorb calcium. Good sources of calcium and vitamin D include: ? Certain fatty fish, such as salmon and tuna. ? Products that have calcium and  vitamin D added to them (fortified products), such as fortified cereals. ? Egg yolks. ? Cheese. ? Liver.  Take over-the-counter and prescription medicines only as told by your health care provider.  Keep all follow-up visits as told by your health care provider. This is important. Contact a health care provider if:  You have never been screened for osteoporosis and you are: ? A woman who is age 61 or older. ? A man who is age 83 or older. Get help right away if:  You fall or injure yourself. Summary  Osteoporosis is thinning and loss of density in your bones. This makes bones more brittle and fragile and more likely to break (fracture),even with minor falls.  The goal of treatment is to strengthen your bones and reduce your risk for a fracture.  Include calcium and vitamin D in your diet. Calcium is important for bone health, and vitamin D helps your body to absorb calcium.  Talk with your health care provider about screening for osteoporosis if you are a woman who is age 11 or older, or a man who is age 28 or older. This information is not intended to replace advice given to you by your health care provider. Make sure you discuss any questions you have with your health care provider. Document Released: 10/29/2004 Document Revised: 11/13/2016 Document Reviewed: 11/13/2016 Elsevier Interactive Patient Education  2019 [place], Adult The ears produce a substance called earwax that helps keep bacteria out of the ear and protects the skin in the ear canal. Occasionally, earwax can build up in the ear and cause discomfort or hearing loss. What increases the risk? This condition is more likely to develop in people who:  Are female.  Are elderly.  Naturally produce more earwax.  Clean their ears often with cotton swabs.  Use earplugs often.  Use in-ear headphones often.  Wear hearing aids.  Have narrow ear canals.  Have earwax that is overly thick or  sticky.  Have eczema.  Are dehydrated.  Have excess hair in the ear canal. What are the signs or symptoms? Symptoms of this condition include:  Reduced or muffled hearing.  A feeling of fullness in the ear or feeling that the ear is plugged.  Fluid coming from the ear.  Ear pain.  Ear itch.  Ringing in the ear.  Coughing.  An obvious piece of earwax that can be seen inside the ear canal. How is this diagnosed? This condition may be diagnosed based on:  Your symptoms.  Your medical history.  An ear exam. During the exam, your health care provider will look into your ear with an instrument called an otoscope. You may have tests, including a hearing test. How is this treated? This condition may be treated by:  Using ear drops to soften the earwax.  Having the earwax removed by a health care provider. The health care provider may: ? Flush the ear with water. ? Use an instrument that has a loop on the end (curette). ? Use a suction device.  Surgery to remove the wax buildup. This may be done in severe cases. Follow these instructions at home:   Take over-the-counter and prescription medicines only as told by your health care provider.  Do not put any objects, including cotton swabs, into your ear. You can clean the opening of your ear canal with a washcloth or facial tissue.  Follow instructions from your health care provider about cleaning your ears. Do not over-clean your ears.  Drink enough fluid to keep your urine clear or pale yellow. This will help to thin the earwax.  Keep all follow-up visits as told by your health care provider. If earwax builds up in your ears often or if you use hearing aids, consider seeing your health care provider for routine, preventive ear cleanings. Ask your health care provider how often you should schedule your cleanings.  If you have hearing aids, clean them according to instructions from the manufacturer and your health care  provider. Contact a health care provider if:  You have ear pain.  You develop a fever.  You have blood, pus, or other fluid coming from your ear.  You have hearing loss.  You have ringing in your ears that does not go away.  Your symptoms do not improve with treatment.  You feel like the room is spinning (vertigo). Summary  Earwax can build up in the ear and cause discomfort or hearing loss.  The most common symptoms of this condition include reduced or muffled hearing and a feeling of fullness in the ear or feeling that the ear is plugged.  This condition may be diagnosed based on your symptoms, your medical history, and an ear exam.  This condition may be treated by using ear drops to soften the earwax or by having the earwax removed by a health care provider.  Do not put any objects, including cotton swabs, into your ear. You can clean the opening of your ear canal with a washcloth or facial tissue. This information is not intended to replace advice given to you by your health care provider. Make sure you discuss any questions you have with your health care provider. Document Released: 02/27/2004 Document Revised: 12/31/2016 Document Reviewed: 04/01/2016 Elsevier Interactive Patient Education  2019 [employer].

## 2018-01-20 NOTE — Progress Notes (Addendum)
Chief Complaint  Patient presents with  . Follow-up   3-4 month f/u  1. Left shoulder surgery sch 02/2018 s/p right shoulder surgery  2. COPD stable no wheezing or sob 3. Osteoporosis she will have been on forteo x 2 years as of 09/2018 seeing [facility] and Dr. Jefm Bryant rheumatology T score last DEXA -4.1 as of 10/12/17 no improvement will disc with both specialists rec.  4. GERD zantac d/c wants to know what to do currently taking protonix 40 mg bid   Review of Systems  Constitutional: Negative for weight loss.  HENT: Negative for hearing loss.   Eyes: Negative for blurred vision.  Respiratory: Negative for shortness of breath.   Cardiovascular: Negative for chest pain.  Gastrointestinal: Negative for abdominal pain.  Musculoskeletal: Negative for falls.  Skin: Negative for rash.  Neurological: Negative for headaches.  Psychiatric/Behavioral: Negative for depression.   Past Medical History:  Diagnosis Date  . Anxiety   . Asthma   . Cataract    s/p b/l repair   . Chicken pox   . CKD (chronic kidney disease)   . CKD (chronic kidney disease), stage III ([place])    a. s/p R nephrectomy.  . Conversion disorder   . COPD (chronic obstructive pulmonary disease) ([place])   . Depression   . Essential hypertension   . GERD (gastroesophageal reflux disease)   . Hyperlipidemia   . Inflammatory arthritis    a. hands/carpal tunnel.  b. Low titer rheumatoid factor. c. Negative anti-CCP antibodies. d. Plaquenil.  . Non-Obstructive CAD    a. 07/2009 Cath ([facility]): nonobs dzs;  b. 03/2011 Cath [facility]): nonobs dzs.  . Osteoarthritis    a. Knees.  . PUD (peptic ulcer disease)   . S/P right hip fracture    11/01/16 s/p repair  . Spinal stenosis at L4-L5 level    severe with L4/L5 anterolisthesis grade 1 anterolisthesis   . Toxic maculopathy   . Valvular heart disease    a. 07/2015 Echo: EF 55-60%, Mild AI, AS, MR, and TR.   Past Surgical History:  Procedure Laterality Date  . APPENDECTOMY     . BACK SURGERY    . BUNIONECTOMY Right   . CATARACT EXTRACTION, BILATERAL    . CESAREAN SECTION     x1  . CHOLECYSTECTOMY N/A 05/11/2016   Procedure: LAPAROSCOPIC CHOLECYSTECTOMY;  Surgeon: Florene Glen, MD;  Location: [facility] OR[facility];  Service: General;  Laterality: N/A;  . COLONOSCOPY WITH PROPOFOL N/A 04/02/2016   Procedure: COLONOSCOPY WITH PROPOFOL;  Surgeon: Jonathon Bellows, MD;  Location: [facility];  Service: Endoscopy;  Laterality: N/A;  . ENDOSCOPIC RETROGRADE CHOLANGIOPANCREATOGRAPHY (ERCP) WITH PROPOFOL N/A 05/08/2016   Procedure: ENDOSCOPIC RETROGRADE CHOLANGIOPANCREATOGRAPHY (ERCP) WITH PROPOFOL;  Surgeon: Lucilla Lame, MD;  Location: [facility];  Service: Endoscopy;  Laterality: N/A;  . ERCP     with biliary spincterotomy 05/08/16 Dr. Allen Norris for choledocholithiasis   . ESOPHAGEAL DILATION  04/02/2016   Procedure: ESOPHAGEAL DILATION;  Surgeon: Jonathon Bellows, MD;  Location: [facility];  Service: Endoscopy;;  . ESOPHAGOGASTRODUODENOSCOPY (EGD) WITH PROPOFOL N/A 04/02/2016   Procedure: ESOPHAGOGASTRODUODENOSCOPY (EGD) WITH PROPOFOL;  Surgeon: Jonathon Bellows, MD;  Location: [facility];  Service: Endoscopy;  Laterality: N/A;  . HIP ARTHROPLASTY Right 11/01/2016   Procedure: ARTHROPLASTY BIPOLAR HIP (HEMIARTHROPLASTY);  Surgeon: Corky Mull, MD;  Location: [facility];  Service: Orthopedics;  Laterality: Right;  . 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 osteoporosis  noted DEXA 08/19/16   . REPLACEMENT TOTAL KNEE Right   . REVERSE SHOULDER ARTHROPLASTY Right 11/04/2017   Procedure: REVERSE SHOULDER ARTHROPLASTY;  Surgeon: Corky Mull, MD;  Location: [facility];  Service: Orthopedics;  Laterality: Right;  . TONSILLECTOMY    . TOTAL HIP ARTHROPLASTY  12/10/11   [facility] left hip  . TOTAL HIP ARTHROPLASTY Bilateral   . TUBAL LIGATION     Family History  Problem Relation Age of Onset  . Rheum arthritis Mother   . Asthma Mother   . Parkinson's  disease Mother   . Heart disease Mother   . Stroke Mother   . Hypertension Mother   . Heart attack Father   . Heart disease Father   . Hypertension Father   . Diabetes Son   . Gout Son   . Asthma Sister   . Heart disease Sister   . Lung cancer Sister   . Heart disease Sister   . Heart disease Sister   . Breast cancer Sister   . Heart attack Sister   . Heart disease Brother   . Heart disease Maternal Grandmother   . Diabetes Maternal Grandmother   . Colon cancer Maternal Grandmother   . Cancer Maternal Grandmother        Hodgkins lymphoma  . Heart disease Brother   . Alcohol abuse Brother   . Depression Brother   . Dementia Son    Social History   Socioeconomic History  . Marital status: Married    Spouse name: richard  . Number of children: 2  . Years of education: Some Coll  . Highest education level: Some college, no degree  Occupational History  . Occupation: Retired    Comment: retired  Scientific laboratory technician  . Financial resource strain: Very hard  . Food insecurity:    Worry: Often true    Inability: Often true  . Transportation needs:    Medical: No    Non-medical: No  Tobacco Use  . Smoking status: Former Smoker    Packs/day: 0.50    Years: 20.00    Pack years: 10.00    Types: Cigarettes    Last attempt to quit: 02/02/1974    Years since quitting: 43.9  . Smokeless tobacco: Never Used  Substance and Sexual Activity  . Alcohol use: No  . Drug use: No  . Sexual activity: Not Currently  Lifestyle  . Physical activity:    Days per week: 0 days    Minutes per session: 0 min  . Stress: Not on file  Relationships  . Social connections:    Talks on phone: More than three times a week    Gets together: Never    Attends religious service: More than 4 times per year    Active member of club or organization: No    Attends meetings of clubs or organizations: Never    Relationship status: Married  . Intimate partner violence:    Fear of current or ex partner: No     Emotionally abused: No    Physically abused: No    Forced sexual activity: No  Other Topics Concern  . Not on file  Social History Narrative   Lives at home in [place] with her husband and grandson.   Right-handed.   6 cups coffee per day.   Current Meds  Medication Sig  . albuterol (VENTOLIN HFA) 108 (90 Base) MCG/ACT inhaler Inhale 2 puffs into the lungs every 6 (six) hours as needed. (Patient taking  differently: Inhale 2 puffs into the lungs every 6 (six) hours as needed for wheezing or shortness of breath. )  . clotrimazole (LOTRIMIN) 1 % cream Apply 1 application topically 2 (two) times daily as needed (for irritation).  Marland Kitchen escitalopram (LEXAPRO) 10 MG tablet Take 10 mg by mouth daily.  . fluticasone furoate-vilanterol (BREO ELLIPTA) 100-25 MCG/INH AEPB Inhale 1 puff into the lungs daily. Rinse mouth  . folic acid (FOLVITE) 1 MG tablet Take 1 mg by mouth daily.  Marland Kitchen gabapentin (NEURONTIN) 300 MG capsule Take 600 mg by mouth 2 (two) times daily.   Marland Kitchen lamoTRIgine (LAMICTAL) 100 MG tablet Take 1 tablet (100 mg total) by mouth 2 (two) times daily.  Marland Kitchen leflunomide (ARAVA) 20 MG tablet Take 1 tablet (20 mg total) by mouth daily.  Marland Kitchen LORazepam (ATIVAN) 0.5 MG tablet Take 1/2 pill at bed time (Patient taking differently: Take 0.25 mg by mouth at bedtime. )  . lovastatin (MEVACOR) 20 MG tablet TAKE 1 TABLET BY MOUTH AT BEDTIME (Patient taking differently: Take 20 mg by mouth at bedtime. )  . Melatonin 10 MG TABS Take 10 mg by mouth at bedtime. (Patient taking differently: Take 12 mg by mouth at bedtime. )  . mirabegron ER (MYRBETRIQ) 50 MG TB24 tablet Take 50 mg by mouth daily.  . montelukast (SINGULAIR) 10 MG tablet Take 1 tablet (10 mg total) by mouth daily.  . multivitamin-lutein (OCUVITE-LUTEIN) CAPS capsule Take 1 capsule by mouth 2 (two) times daily.  . mupirocin ointment (BACTROBAN) 2 % Apply 1 application topically 2 (two) times daily. X 1 week  . nebivolol (BYSTOLIC) 10 MG tablet  Take 1 tablet (10 mg total) by mouth daily. (Patient taking differently: Take 5 mg by mouth daily. )  . ondansetron (ZOFRAN) 4 MG tablet Take 1 tablet (4 mg total) by mouth every 8 (eight) hours as needed for nausea or vomiting.  Marland Kitchen oxyCODONE (OXY IR/ROXICODONE) 5 MG immediate release tablet Take 1-2 tablets (5-10 mg total) by mouth every 4 (four) hours as needed for breakthrough pain.  . pantoprazole (PROTONIX) 40 MG tablet Take 1 tablet (40 mg total) by mouth 2 (two) times daily. 30 minutes before food. Note reduction in frequency  . QUEtiapine (SEROQUEL) 25 MG tablet Take 1 tablet (25 mg total) by mouth at bedtime.  . sucralfate (CARAFATE) 1 g tablet TAKE 1 TABLET BY MOUTH 4 TIMES A DAY WITH MEALS AND AT BEDTIME (Patient taking differently: Take 1 g by mouth 2 (two) times daily. )  . Teriparatide, Recombinant, (FORTEO) 600 MCG/2.4ML SOLN Inject 0.08 mLs (20 mcg total) into the skin daily.  . traMADol (ULTRAM) 50 MG tablet Take 1 tablet (50 mg total) by mouth every 6 (six) hours as needed.  . [DISCONTINUED] escitalopram (LEXAPRO) 10 MG tablet Take 1 tablet (10 mg total) by mouth daily for 15 days.   Allergies  Allergen Reactions  . Ceftin [Cefuroxime Axetil] Anaphylaxis and Other (See Comments)    Other reaction(s): Other (See Comments) REACTION: tongue and throat swell Other Reaction: TONGUE AND THROAT SWELLING REACTION: tongue and throat swell  . Lisinopril Anaphylaxis and Other (See Comments)    REACTION: tongue and throat swelling (onset 10-10-09)  . Morphine Other (See Comments)    Per patient, low blood pressure issues, following morphine, that requires action to raise it back up. Drop in BP - can take small infrequent doses  . Sulfasalazine Anaphylaxis and Other (See Comments)    Swelling tongue, lips, and jaws  . Aspirin  Other (See Comments)    Sulfasalazine allergy cross reacts  . Antihistamines, Chlorpheniramine-Type Other (See Comments)    REACTION: makes pt hyper  . Antivert  [Meclizine Hcl] Other (See Comments)    REACTION: bladder will not empty  . Decongestant [Pseudoephedrine Hcl]     Other reaction(s): Unknown REACTION: makes pt hyper REACTION: makes pt hyper  . Doxycycline     REACTION: GI upset  . Morphine And Related Other (See Comments)    Per patient, low blood pressure issues, following morphine, that requires action to raise it back up.  . Polymyxin B     Medication was in eye drops.  . Pseudoephedrine Hcl Other (See Comments)    REACTION: makes pt hyper  . Sulfa Antibiotics Other (See Comments)    Face swelling  . Xarelto [Rivaroxaban]     Other reaction(s): Other (See Comments) GI bleed Stomach burning, bleeding, and tar in stool GI bleed  . Adhesive [Tape] Rash  . Iodine Hives and Rash    Per patient allergy is to contrast dye only, she is able to use betadine scrubs.  [place] [Levofloxacin In D5w] Rash  . Tetanus Toxoid Adsorbed Other (See Comments)    REACTION: rash, fever, hot to touch at injection site  . Tetanus Toxoids Other (See Comments)    REACTION: rash, fever, hot to touch at injection site   Recent Results (from the past 2160 hour(s))  Surgical pathology     Status: None   Collection Time: 11/04/17  9:52 AM  Result Value Ref Range   SURGICAL PATHOLOGY      Surgical Pathology CASE: [id number] PATIENT: Jeanella Anton Surgical Pathology Report     SPECIMEN SUBMITTED: A. 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     DIAGNOSIS: A. HUMERAL HEAD, RIGHT; ARTHROPLASTY: - OSTEOARTHROSIS. - CHANGES CONSISTENT WITH AVASCULAR NECROSIS.   GROSS DESCRIPTION: A. Labeled: Right humeral head Received: In formalin Size of specimen:      4.3 x 4.2 x 1.9 cm Articular surface: Tan to yellow with focal disruption and sloughing of the articular surface Cut  surface: Yellow to tan with chalky yellow discoloration underlying the disrupted (2.1 x 1.8 cm area) under articular surface Other findings: No additional noted  Block summary: 1-2 - representative sections  Tissue decalcification: yes   Final Diagnosis performed by Quay Burow, MD.   Electronically signed  11/08/2017 2:46:21PM The electronic signature indicates that the named Attending Pathologist has evaluated the specimen  Technical component performed at [facility], [address], [place], [place] [address] Lab: [phone] Dir: Rush Farmer, MD, MMM  Professional component performed at [facility], [facility], [place], [place],  [address] Lab: [phone] Dir: Dellia Nims. Rubinas, MD   CBC with Differential/Platelet     Status: Abnormal   Collection Time: 11/05/17  5:23 AM  Result Value Ref Range   WBC 15.6 (H) 3.6 - 11.0 K/uL   RBC 3.76 (L) 3.80 - 5.20 MIL/uL   Hemoglobin 11.1 (L) 12.0 - 16.0 g/dL   HCT 33.3 (L) 35.0 - 47.0 %   MCV 88.6 80.0 - 100.0 fL   MCH 29.6 26.0 - 34.0 pg   MCHC 33.4 32.0 - 36.0 g/dL   RDW 15.2 (H) 11.5 - 14.5 %   Platelets 184 150 - 440 K/uL   Neutrophils Relative % 84 %   Neutro Abs 13.2 (H)  1.4 - 6.5 K/uL   Lymphocytes Relative 8 %   Lymphs Abs 1.2 1.0 - 3.6 K/uL   Monocytes Relative 7 %   Monocytes Absolute 1.1 (H) 0.2 - 0.9 K/uL   Eosinophils Relative 0 %   Eosinophils Absolute 0.0 0 - 0.7 K/uL   Basophils Relative 1 %   Basophils Absolute 0.1 0 - 0.1 K/uL    Comment: Performed at [facility], [address] [place], [place] [address]  Basic metabolic panel     Status: Abnormal   Collection Time: 11/05/17  5:23 AM  Result Value Ref Range   Sodium 141 135 - 145 mmol/L   Potassium 4.4 3.5 - 5.1 mmol/L   Chloride 105 98 - 111 mmol/L   CO2 27 22 - 32 mmol/L   Glucose, Bld 145 (H) 70 - 99 mg/dL   BUN 17 8 - 23 mg/dL   Creatinine, Ser 1.16 (H) 0.44 - 1.00 mg/dL   Calcium 8.0 (L) 8.9 - 10.3 mg/dL    GFR calc non Af Amer 45 (L) >60 mL/min   GFR calc Af Amer 52 (L) >60 mL/min    Comment: (NOTE) The eGFR has been calculated using the CKD EPI equation. This calculation has not been validated in all clinical situations. eGFR's persistently <60 mL/min signify possible Chronic Kidney Disease.    Anion gap 9 5 - 15    Comment: Performed at [facility], [address] [place], [place] [address]   Objective  Body mass index is 32.99 kg/m. Wt Readings from Last 3 Encounters:  01/20/18 174 lb 9.6 oz (79.2 kg)  11/04/17 167 lb 3.2 oz (75.8 kg)  10/21/17 171 lb 14.4 oz (78 kg)   Temp Readings from Last 3 Encounters:  01/20/18 97.8 F (36.6 C) (Oral)  11/05/17 98.3 F (36.8 C) (Oral)  10/19/17 97.9 F (36.6 C) (Oral)   BP Readings from Last 3 Encounters:  01/20/18 130/78  11/05/17 (!) 148/66  10/21/17 (!) 138/58   Pulse Readings from Last 3 Encounters:  01/20/18 78  11/05/17 79  10/21/17 68    Physical Exam Vitals signs and nursing note reviewed.  Constitutional:      Appearance: Normal appearance. She is well-developed.  HENT:     Head: Normocephalic and atraumatic.     Ears:     Comments: Right ear cerumen impaction     Nose: Nose normal.     Mouth/Throat:     Mouth: Mucous membranes are moist.     Pharynx: Oropharynx is clear.  Eyes:     Conjunctiva/sclera: Conjunctivae normal.     Pupils: Pupils are equal, round, and reactive to light.  Cardiovascular:     Rate and Rhythm: Normal rate and regular rhythm.     Pulses: Normal pulses.     Heart sounds: Murmur present.  Pulmonary:     Effort: Pulmonary effort is normal.     Breath sounds: Normal breath sounds.  Skin:    General: Skin is warm and dry.  Neurological:     General: No focal deficit present.     Mental Status: She is alert and oriented to person, place, and time.     Gait: Gait normal.  Psychiatric:        Attention and Perception: Attention and perception normal.        Mood and  Affect: Mood and affect normal.        Speech: Speech normal.  Behavior: Behavior normal. Behavior is cooperative.        Thought Content: Thought content normal.        Cognition and Memory: Cognition and memory normal.        Judgment: Judgment normal.     Assessment   1. Left shoulder OA. AVN, rotator cuff d/o s/p right shoulder surgery Dr. Roland Rack 2. COPD controlled  3. Osteoporosis  4. HM  5. Leukocytosis  6. HLD  7. GERD 8. Right ear cerumen impaction  Plan   1.  Surgery sch 1/20 with orth left  2. Cont meds and f/u Dr. Lake Bells  3.  Will disc with [facility] and rheumatology about what to do if Forteo is not working repeat DEXA 10/2017 worse T score -4.1 what do we do next will have both weight in?  4.  Had flu per pt had at pharmacy, prevnar, pna 23, allergic to Tdap, declines shingrix   mammo 01/19/17 negative ordered due 01/19/18 pt needs to call and sch  Colonoscopy 04/02/16 multiple polyps but tortuous colon GI does not want to do any further  Out of age window pap  DEXA 08/19/16 +osteoporosis on Forteo will need to repeat 1 year after Forteo use to see improvement rheumatology following. -DEXA 10/12/17 with T score -4.1 worse on forteo it will be 2 years as of 09/2018 - will f/u endocrine and rheumatology and will ask both what the rec should be   Seeing dermatology in [facility] specialist 08/2017  rheuma appt 01/17/18 left shoulder surgery sch 02/2018 s/p right shoulder surgery  C/w RA precet for Humira f/u in 4-6 weeks Dr. Precious Reel  5. Repeat CBC consider heme referral in the future  Pt wants to hold for now ans WBC normal  6. Lipid today increase mevacor 20 to 40 mg qhs  7. pepcid ac in am and protonix 40 mg qhs  8. Removed with currette today on the right debrox ear drops Provider: Dr. Olivia Mackie McLean-Scocuzza-[facility]

## 2018-01-21 ENCOUNTER — Other Ambulatory Visit: Payer: Self-pay | Admitting: Internal Medicine

## 2018-01-21 ENCOUNTER — Other Ambulatory Visit (INDEPENDENT_AMBULATORY_CARE_PROVIDER_SITE_OTHER): Payer: Medicare Other

## 2018-01-21 DIAGNOSIS — E785 Hyperlipidemia, unspecified: Secondary | ICD-10-CM

## 2018-01-21 DIAGNOSIS — D72829 Elevated white blood cell count, unspecified: Secondary | ICD-10-CM | POA: Diagnosis not present

## 2018-01-21 LAB — CBC WITH DIFFERENTIAL/PLATELET
Basophils Absolute: 0.1 10*3/uL (ref 0.0–0.1)
Basophils Relative: 0.9 % (ref 0.0–3.0)
Eosinophils Absolute: 0.6 10*3/uL (ref 0.0–0.7)
Eosinophils Relative: 7.1 % — ABNORMAL HIGH (ref 0.0–5.0)
HCT: 39.6 % (ref 36.0–46.0)
Hemoglobin: 12.9 g/dL (ref 12.0–15.0)
Lymphocytes Relative: 24.2 % (ref 12.0–46.0)
Lymphs Abs: 2.1 10*3/uL (ref 0.7–4.0)
MCHC: 32.5 g/dL (ref 30.0–36.0)
MCV: 87.3 fl (ref 78.0–100.0)
Monocytes Absolute: 0.8 10*3/uL (ref 0.1–1.0)
Monocytes Relative: 9.2 % (ref 3.0–12.0)
Neutro Abs: 5 10*3/uL (ref 1.4–7.7)
Neutrophils Relative %: 58.6 % (ref 43.0–77.0)
Platelets: 236 10*3/uL (ref 150.0–400.0)
RBC: 4.54 Mil/uL (ref 3.87–5.11)
RDW: 14.8 % (ref 11.5–15.5)
WBC: 8.5 10*3/uL (ref 4.0–10.5)

## 2018-01-21 LAB — LIPID PANEL
Cholesterol: 144 mg/dL (ref 0–200)
HDL: 40.5 mg/dL (ref >39.00)
NonHDL: 103.01
Total CHOL/HDL Ratio: 4
Triglycerides: 255 mg/dL — ABNORMAL HIGH (ref 0.0–149.0)
VLDL: 51 mg/dL — ABNORMAL HIGH (ref 0.0–40.0)

## 2018-01-21 LAB — LDL CHOLESTEROL, DIRECT: Direct LDL: 60 mg/dL

## 2018-01-21 MED ORDER — LOVASTATIN 40 MG PO TABS: 40.0000 mg | ORAL_TABLET | Freq: Every day | ORAL | 3 refills | Status: DC

## 2018-01-24 ENCOUNTER — Encounter: Payer: Self-pay | Admitting: Internal Medicine

## 2018-01-24 NOTE — Progress Notes (Signed)
Dr Charna Elizabeth has been sent information.

## 2018-01-24 NOTE — Progress Notes (Signed)
Dr. Cristi Loron   [place]

## 2018-01-24 NOTE — Progress Notes (Signed)
What is Dr Fletcher Anon full name and location?

## 2018-01-25 NOTE — Progress Notes (Signed)
Note has been faxed to Dr Juleen China.

## 2018-02-01 ENCOUNTER — Telehealth: Payer: Self-pay

## 2018-02-01 NOTE — Telephone Encounter (Signed)
Copied from [place] [phone]. Topic: General - Other >> Feb 01, 2018 10:01 AM Yvette Rack wrote: Reason for CRM: Pt requests to speak with Fransisco Beau. Pt requests call back. Cb# [phone]

## 2018-02-01 NOTE — Telephone Encounter (Signed)
Spoken to patient she is still having coughing SX.  Appointment made [id number] @1315 

## 2018-02-03 ENCOUNTER — Ambulatory Visit: Payer: Medicare Other | Admitting: Internal Medicine

## 2018-02-07 ENCOUNTER — Telehealth: Payer: Self-pay

## 2018-02-07 NOTE — Telephone Encounter (Signed)
I have appointment on hold for patient [id number] @1130  1min. Copied from [place] [phone]. Topic: General - Other >> Feb 07, 2018 10:42 AM Windy Kalata wrote: Reason for CRM: Patient would like an appt she is having surgery a week from now and has a cough she is not able to get in to see Dr. Jacklynn Lewis until 1/28, she does not want to see anyone else, she is asking for a call from [place]. Please advise  Best call back is [phone]

## 2018-02-09 ENCOUNTER — Ambulatory Visit: Payer: Medicare Other | Admitting: Internal Medicine

## 2018-02-09 NOTE — Telephone Encounter (Signed)
Called patient as she missed her appt this morning at 11:30am with Dr Aundra Dubin.  Pt states that when It was scheduled for made the CMA aware that she may not be able to make her appt as her husband was in the hospital. Pt states that she is currently at the Hospital with her  Husband and she will call back and reschedule once they get home and he is settled. I have let Dr Aundra Dubin know and she is going to ensure that the patient is not charged for todays missed appt.  I have let the front desk know the appt needs to be cancelled - NO CHARGE.  Nothing further needed.

## 2018-02-09 NOTE — Telephone Encounter (Signed)
Agree   TMS 

## 2018-02-11 ENCOUNTER — Inpatient Hospital Stay: Admission: RE | Admit: 2018-02-11 | Payer: Medicare Other | Source: Ambulatory Visit

## 2018-02-14 ENCOUNTER — Telehealth: Payer: Self-pay

## 2018-02-14 NOTE — Telephone Encounter (Signed)
pt left message she needs refills on medications.

## 2018-02-14 NOTE — Telephone Encounter (Signed)
  pt called left message that she is out of medications needs refill esp on her lamictal    lamoTRIgine (LAMICTAL) 100 MG tablet  Medication  Date: 11/08/2017 Department: [facility] Ordering/Authorizing: Rainey Pines, MD  Order Providers   Prescribing Provider Encounter Provider  Rainey Pines, MD Rainey Pines, MD  Outpatient Medication Detail    Disp Refills Start End   lamoTRIgine (LAMICTAL) 100 MG tablet 60 tablet 0 11/08/2017    Sig - Route: Take 1 tablet (100 mg total) by mouth 2 (two) times daily. - Oral   Sent to pharmacy as: lamoTRIgine (LAMICTAL) 100 MG tablet   E-Prescribing Status: Receipt confirmed by pharmacy (11/08/2017 9:37 AM EDT)     QUEtiapine (SEROQUEL) 25 MG tablet  Medication  Date: 11/08/2017 Department: [facility] Ordering/Authorizing: Rainey Pines, MD  Order Providers   Prescribing Provider Encounter Provider  Rainey Pines, MD Rainey Pines, MD  Outpatient Medication Detail    Disp Refills Start End   QUEtiapine (SEROQUEL) 25 MG tablet 30 tablet 0 11/08/2017    Sig - Route: Take 1 tablet (25 mg total) by mouth at bedtime. - Oral   Sent to pharmacy as: QUEtiapine (SEROQUEL) 25 MG tablet   E-Prescribing Status: Receipt confirmed by pharmacy (11/08/2017 9:37 AM EDT)    LORazepam (ATIVAN) 0.5 MG tablet  Medication  Date: 08/09/2017 Department: [facility] Ordering/Authorizing: Rainey Pines, MD  Order Providers   Prescribing Provider Encounter Provider  Rainey Pines, MD Rainey Pines, MD  Outpatient Medication Detail    Disp Refills Start End   LORazepam (ATIVAN) 0.5 MG tablet 15 tablet 5 08/09/2017    Sig: Take 1/2 pill at bed time   Patient taking differently: Take 0.25 mg by mouth at bedtime.        Class: Print   Notes to Pharmacy: PLEASE SPLIT THE LORAZEPAM TO 1/2 pill FOR THE PT.

## 2018-02-15 ENCOUNTER — Inpatient Hospital Stay: Admission: RE | Admit: 2018-02-15 | Payer: Medicare Other | Source: Ambulatory Visit

## 2018-02-15 ENCOUNTER — Telehealth: Payer: Self-pay | Admitting: Psychiatry

## 2018-02-15 DIAGNOSIS — F39 Unspecified mood [affective] disorder: Secondary | ICD-10-CM

## 2018-02-15 MED ORDER — QUETIAPINE FUMARATE 25 MG PO TABS: 25.0000 mg | ORAL_TABLET | Freq: Every day | ORAL | 0 refills | Status: DC

## 2018-02-15 MED ORDER — LAMOTRIGINE 100 MG PO TABS: 100.0000 mg | ORAL_TABLET | Freq: Two times a day (BID) | ORAL | 0 refills | Status: DC

## 2018-02-15 NOTE — Telephone Encounter (Signed)
I have sent Lamictal and seroquel to pharmacy. She has refills left on lorazepam per [facility] database.

## 2018-02-21 ENCOUNTER — Telehealth: Payer: Self-pay

## 2018-02-21 ENCOUNTER — Encounter: Payer: Self-pay | Admitting: Psychiatry

## 2018-02-21 ENCOUNTER — Ambulatory Visit: Payer: Medicare Other | Admitting: Psychiatry

## 2018-02-21 DIAGNOSIS — F39 Unspecified mood [affective] disorder: Secondary | ICD-10-CM | POA: Diagnosis not present

## 2018-02-21 DIAGNOSIS — F411 Generalized anxiety disorder: Secondary | ICD-10-CM | POA: Diagnosis not present

## 2018-02-21 MED ORDER — QUETIAPINE FUMARATE 25 MG PO TABS: 25.0000 mg | ORAL_TABLET | Freq: Every day | ORAL | 2 refills | Status: DC

## 2018-02-21 MED ORDER — ALPRAZOLAM 0.25 MG PO TABS: 0.2500 mg | ORAL_TABLET | Freq: Every evening | ORAL | 2 refills | Status: DC | PRN

## 2018-02-21 MED ORDER — GABAPENTIN 300 MG PO CAPS: 600.0000 mg | ORAL_CAPSULE | Freq: Two times a day (BID) | ORAL | 2 refills | Status: DC

## 2018-02-21 MED ORDER — LAMOTRIGINE 100 MG PO TABS: 100.0000 mg | ORAL_TABLET | Freq: Two times a day (BID) | ORAL | 2 refills | Status: DC

## 2018-02-21 NOTE — Telephone Encounter (Signed)
Copied from [place] [phone]. Topic: General - Inquiry >> Feb 21, 2018  3:09 PM Conception Chancy, NT wrote: Reason for CRM: patient would like a call back from [place].

## 2018-02-21 NOTE — Progress Notes (Signed)
Psychiatric MD Progress Note  Patient Identification: Kerri Carter MRN:  [id number] Date of Evaluation:  02/21/2018 Referral Source: PCP Chief Complaint:    Visit Diagnosis:    ICD-10-CM   1. Episodic mood disorder (HCC) F39 lamoTRIgine (LAMICTAL) 100 MG tablet  2. Anxiety state F41.1     History of Present Illness:   Patient is a 78 year old white female who presented for follow up. Patient reported that she has been helping her husband as he had MI in December and is currently recuperating on congestive heart failure.  She reported that she is trying to fix his medications on a daily basis.  She reported that she is having difficult time cutting the Ativan pills in half as they usually crumble.  She brought the bottle with her.  She reported that she is interested in trying the Xanax as she has never taken it in the past.  She takes Seroquel 25 mg as well as melatonin at night as she has difficult time sleeping.  We discussed about her medications in detail.  She reported that she has been helping her husband and her family is also here including her grandchildren.  She appears pleasant and cooperative during the interview.  Patient reported that she is going to have her shoulder surgery in March as she is looking forward to work outside in the yard as she was unable to do yard work last year.  Patient appears motivated to have her surgery.  She is compliant with her medications.  She reported that she does not take more than the morning prescribed of her prescription medications.    She denied having any neurovegetative symptoms of depression including insomnia depression anxiety or paranoia.  No acute symptoms noted at this time.  She denied having any suicidal homicidal ideations or plans.       She appears pleasant and cooperative during the interview.    Associated Signs/Symptoms: Depression Symptoms:  anxiety, (Hypo) Manic Symptoms:  Labiality of Mood, Anxiety Symptoms:   Excessive Worry, Psychotic Symptoms:  none PTSD Symptoms: Negative NA  Past Psychiatric History: H/o conversion disorder.  Previous Psychotropic Medications:  Trazodone  ambien lexapro lamictal   Substance Abuse History in the last 12 months:  No.  Consequences of Substance Abuse: Negative NA  Past Medical History:  Past Medical History:  Diagnosis Date  . Anxiety   . Asthma   . Cataract    s/p b/l repair   . Chicken pox   . CKD (chronic kidney disease)   . CKD (chronic kidney disease), stage III ([place])    a. s/p R nephrectomy.  . Conversion disorder   . COPD (chronic obstructive pulmonary disease) ([place])   . Depression   . Essential hypertension   . GERD (gastroesophageal reflux disease)   . Hyperlipidemia   . Inflammatory arthritis    a. hands/carpal tunnel.  b. Low titer rheumatoid factor. c. Negative anti-CCP antibodies. d. Plaquenil.  . Non-Obstructive CAD    a. 07/2009 Cath ([facility]): nonobs dzs;  b. 03/2011 Cath [facility]): nonobs dzs.  . Osteoarthritis    a. Knees.  . PUD (peptic ulcer disease)   . S/P right hip fracture    11/01/16 s/p repair  . Spinal stenosis at L4-L5 level    severe with L4/L5 anterolisthesis grade 1 anterolisthesis   . Toxic maculopathy   . Valvular heart disease    a. 07/2015 Echo: EF 55-60%, Mild AI, AS, MR, and TR.    Past Surgical History:  Procedure Laterality Date  . APPENDECTOMY    . BACK SURGERY    . BUNIONECTOMY Right   . CATARACT EXTRACTION, BILATERAL    . CESAREAN SECTION     x1  . CHOLECYSTECTOMY N/A 05/11/2016   Procedure: LAPAROSCOPIC CHOLECYSTECTOMY;  Surgeon: Florene Glen, MD;  Location: [facility] ORS;  Service: General;  Laterality: N/A;  . COLONOSCOPY WITH PROPOFOL N/A 04/02/2016   Procedure: COLONOSCOPY WITH PROPOFOL;  Surgeon: Jonathon Bellows, MD;  Location: [facility] END[facility]COPY;  Service: Endoscopy;  Laterality: N/A;  . ENDOSCOPIC RETROGRADE CHOLANGIOPANCREATOGRAPHY (ERCP) WITH PROPOFOL N/A 05/08/2016   Procedure: ENDOSCOPIC  RETROGRADE CHOLANGIOPANCREATOGRAPHY (ERCP) WITH PROPOFOL;  Surgeon: Lucilla Lame, MD;  Location: [facility] ENDOSCOPY;  Service: Endoscopy;  Laterality: N/A;  . ERCP     with biliary spincterotomy 05/08/16 Dr. Allen Norris for choledocholithiasis   . ESOPHAGEAL DILATION  04/02/2016   Procedure: ESOPHAGEAL DILATION;  Surgeon: Jonathon Bellows, MD;  Location: [facility] END[facility];  Service: Endoscopy;;  . ESOPHAGOGASTRODUODENOSCOPY (EGD) WITH PROPOFOL N/A 04/02/2016   Procedure: ESOPHAGOGASTRODUODENOSCOPY (EGD) WITH PROPOFOL;  Surgeon: Jonathon Bellows, MD;  Location: [facility] END[facility];  Service: Endoscopy;  Laterality: N/A;  . HIP ARTHROPLASTY Right 11/01/2016   Procedure: ARTHROPLASTY BIPOLAR HIP (HEMIARTHROPLASTY);  Surgeon: Corky Mull, MD;  Location: [facility] ORS;  Service: Orthopedics;  Laterality: Right;  . 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 osteoporosis     noted DEXA 08/19/16   . REPLACEMENT TOTAL KNEE Right   . REVERSE SHOULDER ARTHROPLASTY Right 11/04/2017   Procedure: REVERSE SHOULDER ARTHROPLASTY;  Surgeon: Corky Mull, MD;  Location: [facility] OR[facility];  Service: Orthopedics;  Laterality: Right;  . TONSILLECTOMY    . TOTAL HIP ARTHROPLASTY  12/10/11   [facility] left hip  . TOTAL HIP ARTHROPLASTY Bilateral   . TUBAL LIGATION      Family Psychiatric History:  denied  Family History:  Family History  Problem Relation Age of Onset  . Rheum arthritis Mother   . Asthma Mother   . Parkinson's disease Mother   . Heart disease Mother   . Stroke Mother   . Hypertension Mother   . Heart attack Father   . Heart disease Father   . Hypertension Father   . Diabetes Son   . Gout Son   . Asthma Sister   . Heart disease Sister   . Lung cancer Sister   . Heart disease Sister   . Heart disease Sister   . Breast cancer Sister   . Heart attack Sister   . Heart disease Brother   . Heart disease Maternal Grandmother   . Diabetes Maternal Grandmother   . Colon cancer Maternal  Grandmother   . Cancer Maternal Grandmother        Hodgkins lymphoma  . Heart disease Brother   . Alcohol abuse Brother   . Depression Brother   . Dementia Son     Social History:   Social History   Socioeconomic History  . Marital status: Married    Spouse name: richard  . Number of children: 2  . Years of education: Some Coll  . Highest education level: Some college, no degree  Occupational History  . Occupation: Retired    Comment: retired  Scientific laboratory technician  . Financial resource strain: Very hard  . Food insecurity:    Worry: Often true    Inability: Often true  . Transportation needs:    Medical: No    Non-medical: No  Tobacco  Use  . Smoking status: Former Smoker    Packs/day: 0.50    Years: 20.00    Pack years: 10.00    Types: Cigarettes    Last attempt to quit: 02/02/1974    Years since quitting: 44.0  . Smokeless tobacco: Never Used  Substance and Sexual Activity  . Alcohol use: No  . Drug use: No  . Sexual activity: Not Currently  Lifestyle  . Physical activity:    Days per week: 0 days    Minutes per session: 0 min  . Stress: Not on file  Relationships  . Social connections:    Talks on phone: More than three times a week    Gets together: Never    Attends religious service: More than 4 times per year    Active member of club or organization: No    Attends meetings of clubs or organizations: Never    Relationship status: Married  Other Topics Concern  . Not on file  Social History Narrative   Lives at home in [place] with her husband and grandson.   Right-handed.   6 cups coffee per day.    Additional Social History:  Married Lives with husband, grand son and his fiance.   Allergies:   Allergies  Allergen Reactions  . Ceftin [Cefuroxime Axetil] Anaphylaxis and Other (See Comments)    Other reaction(s): Other (See Comments) REACTION: tongue and throat swell Other Reaction: TONGUE AND THROAT SWELLING REACTION: tongue and throat swell  .  Lisinopril Anaphylaxis and Other (See Comments)    REACTION: tongue and throat swelling (onset 10-10-09)  . Morphine Other (See Comments)    Per patient, low blood pressure issues, following morphine, that requires action to raise it back up. Drop in BP - can take small infrequent doses  . Sulfasalazine Anaphylaxis and Other (See Comments)    Swelling tongue, lips, and jaws  . Aspirin Other (See Comments)    Sulfasalazine allergy cross reacts  . Antihistamines, Chlorpheniramine-Type Other (See Comments)    REACTION: makes pt hyper  . Antivert [Meclizine Hcl] Other (See Comments)    REACTION: bladder will not empty  . Decongestant [Pseudoephedrine Hcl] Other (See Comments)    Other reaction(s): Unknown REACTION: makes pt hyper REACTION: makes pt hyper  . Doxycycline Other (See Comments)    REACTION: GI upset  . Morphine And Related Other (See Comments)    Per patient, low blood pressure issues, following morphine, that requires action to raise it back up.  . Polymyxin B Other (See Comments)    Medication was in eye drops.  . Pseudoephedrine Hcl Other (See Comments)    REACTION: makes pt hyper  . Sulfa Antibiotics Other (See Comments)    Face swelling  . Xarelto [Rivaroxaban] Other (See Comments)    Other reaction(s): Other (See Comments) GI bleed Stomach burning, bleeding, and tar in stool GI bleed  . Adhesive [Tape] Rash  . Iodine Hives and Rash    Per patient allergy is to contrast dye only, she is able to use betadine scrubs.  [place] [Levofloxacin In D5w] Rash  . Tetanus Toxoid Adsorbed Other (See Comments)    REACTION: rash, fever, hot to touch at injection site  . Tetanus Toxoids Other (See Comments)    REACTION: rash, fever, hot to touch at injection site    Metabolic Disorder Labs: Lab Results  Component Value Date   HGBA1C 5.6 08/06/2017   MPG 114 08/06/2017   MPG 117 05/07/2016   No results  found for: PROLACTIN Lab Results  Component Value Date   CHOL 144  01/21/2018   TRIG 255.0 (H) 01/21/2018   HDL 40.50 01/21/2018   CHOLHDL 4 01/21/2018   VLDL 51.0 (H) 01/21/2018   LDLCALC 86 08/06/2017   LDLCALC 43 06/08/2016     Current Medications: Current Outpatient Medications  Medication Sig Dispense Refill  . albuterol (VENTOLIN HFA) 108 (90 Base) MCG/ACT inhaler Inhale 2 puffs into the lungs every 6 (six) hours as needed. (Patient taking differently: Inhale 2 puffs into the lungs every 6 (six) hours as needed for wheezing or shortness of breath. ) 1 Inhaler 5  . ALPRAZolam (XANAX) 0.25 MG tablet Take 1 tablet (0.25 mg total) by mouth at bedtime as needed for anxiety. 30 tablet 2  . clotrimazole (LOTRIMIN) 1 % cream Apply 1 application topically 2 (two) times daily as needed (for irritation). 60 g 11  . escitalopram (LEXAPRO) 10 MG tablet Take 1 tablet (10 mg total) by mouth daily. 90 tablet 3  . famotidine (PEPCID) 20 MG tablet Take 1 tablet (20 mg total) by mouth daily before breakfast. 90 tablet 3  . fluticasone furoate-vilanterol (BREO ELLIPTA) 100-25 MCG/INH AEPB Inhale 1 puff into the lungs daily. Rinse mouth 3 each 3  . folic acid (FOLVITE) 1 MG tablet Take 1 mg by mouth daily.    Marland Kitchen gabapentin (NEURONTIN) 300 MG capsule Take 2 capsules (600 mg total) by mouth 2 (two) times daily. 60 capsule 2  . lamoTRIgine (LAMICTAL) 100 MG tablet Take 1 tablet (100 mg total) by mouth 2 (two) times daily. 60 tablet 2  . leflunomide (ARAVA) 20 MG tablet Take 1 tablet (20 mg total) by mouth daily.    Marland Kitchen lovastatin (MEVACOR) 40 MG tablet Take 1 tablet (40 mg total) by mouth at bedtime. 90 tablet 3  . Melatonin 10 MG TABS Take 10 mg by mouth at bedtime. (Patient taking differently: Take 12 mg by mouth at bedtime. ) 30 tablet 3  . mirabegron ER (MYRBETRIQ) 50 MG TB24 tablet Take 50 mg by mouth daily.    . montelukast (SINGULAIR) 10 MG tablet Take 1 tablet (10 mg total) by mouth daily. 90 tablet 3  . multivitamin-lutein (OCUVITE-LUTEIN) CAPS capsule Take 1 capsule  by mouth 2 (two) times daily.    . mupirocin ointment (BACTROBAN) 2 % Apply 1 application topically 2 (two) times daily. X 1 week 30 g 0  . nebivolol (BYSTOLIC) 10 MG tablet Take 1 tablet (10 mg total) by mouth daily. (Patient taking differently: Take 5 mg by mouth daily. ) 30 tablet 5  . ondansetron (ZOFRAN) 4 MG tablet Take 1 tablet (4 mg total) by mouth every 8 (eight) hours as needed for nausea or vomiting. 30 tablet 0  . oxyCODONE (OXY IR/ROXICODONE) 5 MG immediate release tablet Take 1-2 tablets (5-10 mg total) by mouth every 4 (four) hours as needed for breakthrough pain. 60 tablet 0  . pantoprazole (PROTONIX) 40 MG tablet Take 1 tablet (40 mg total) by mouth at bedtime. 30 minutes before food. Note reduction in frequency 90 tablet 3  . QUEtiapine (SEROQUEL) 25 MG tablet Take 1 tablet (25 mg total) by mouth at bedtime. 30 tablet 2  . sucralfate (CARAFATE) 1 g tablet TAKE 1 TABLET BY MOUTH 4 TIMES A DAY WITH MEALS AND AT BEDTIME (Patient taking differently: Take 1 g by mouth 2 (two) times daily. ) 120 tablet 2  . Teriparatide, Recombinant, (FORTEO) 600 MCG/2.4ML SOLN Inject 0.08 mLs (20 mcg  total) into the skin daily. 2.24 mL 0  . traMADol (ULTRAM) 50 MG tablet Take 1 tablet (50 mg total) by mouth every 6 (six) hours as needed. 40 tablet 0   No current facility-administered medications for this visit.     Neurologic: Headache: No Seizure: No Paresthesias:No  Musculoskeletal: Strength & Muscle Tone: within normal limits Gait & Station: normal Patient leans: N/A  Psychiatric Specialty Exam: ROS  There were no vitals taken for this visit.There is no height or weight on file to calculate BMI.  General Appearance: Casual  Eye Contact:  Fair  Speech:  Clear and Coherent  Volume:  Normal  Mood:  Anxious and Depressed  Affect:  Appropriate  Thought Process:  Coherent  Orientation:  Full (Time, Place, and Person)  Thought Content:  WDL  Suicidal Thoughts:  No  Homicidal Thoughts:   No  Memory:  Immediate;   Fair Recent;   Fair Remote;   Fair  Judgement:  Fair  Insight:  Fair  Psychomotor Activity:  Normal  Concentration:  Concentration: Fair and Attention Span: Fair  Recall:  [employer] of Knowledge:Fair  Language: Fair  Akathisia:  No  Handed:  Right  AIMS (if indicated):    Assets:  Communication Skills Desire for Improvement Physical Health  ADL's:  Intact  Cognition: WNL  Sleep:  poor    Treatment Plan Summary: Medication management   Discussed with patient about her medications. I will adjust her medications as follows.  Continue lamotrigine 100 mg by mouth twice a day Continue  Lexapro 10 mg every morning Discontinue lorazepam and I will start her on Xanax 0.25 mg p.o. nightly at bedtime.  She agreed with the plan.  Continue  Seroquel 25 mg by mouth daily at bedtime for mood symptoms and depression. Melatonin 10 mg by mouth daily at bedtime when necessary and she agreed with the plan.   Discussed with her about the side effects in detail and she demonstrated understanding.  Follow-up in 2 months or earlier depending on her symptoms.   More than 50% of the time spent in psychoeducation, counseling and coordination of care.    This note was generated in part or whole with voice recognition software. Voice regonition is usually quite accurate but there are transcription errors that can and very often do occur. I apologize for any typographical errors that were not detected and corrected.    Rainey Pines, MD 1/20/202012:14 PM

## 2018-02-24 ENCOUNTER — Ambulatory Visit: Payer: Medicare Other | Admitting: Internal Medicine

## 2018-02-24 ENCOUNTER — Encounter: Payer: Self-pay | Admitting: Internal Medicine

## 2018-02-24 VITALS — BP 130/70 | HR 69 | Temp 97.9°F | Ht 61.0 in | Wt 174.0 lb

## 2018-02-24 DIAGNOSIS — I38 Endocarditis, valve unspecified: Secondary | ICD-10-CM

## 2018-02-24 DIAGNOSIS — J441 Chronic obstructive pulmonary disease with (acute) exacerbation: Secondary | ICD-10-CM

## 2018-02-24 DIAGNOSIS — R0602 Shortness of breath: Secondary | ICD-10-CM

## 2018-02-24 DIAGNOSIS — J209 Acute bronchitis, unspecified: Secondary | ICD-10-CM

## 2018-02-24 DIAGNOSIS — J440 Chronic obstructive pulmonary disease with acute lower respiratory infection: Secondary | ICD-10-CM | POA: Diagnosis not present

## 2018-02-24 MED ORDER — TIOTROPIUM BROMIDE MONOHYDRATE 2.5 MCG/ACT IN AERS: 2.0000 | INHALATION_SPRAY | Freq: Every day | RESPIRATORY_TRACT | 12 refills | Status: DC

## 2018-02-24 MED ORDER — HYDROCODONE-HOMATROPINE 5-1.5 MG/5ML PO SYRP: 5.0000 mL | ORAL_SOLUTION | Freq: Every evening | ORAL | 0 refills | Status: DC | PRN

## 2018-02-24 MED ORDER — TIOTROPIUM BROMIDE MONOHYDRATE 2.5 MCG/ACT IN AERS: 1.0000 | INHALATION_SPRAY | Freq: Every day | RESPIRATORY_TRACT | 12 refills | Status: DC

## 2018-02-24 MED ORDER — BENZONATATE 100 MG PO CAPS: 100.0000 mg | ORAL_CAPSULE | Freq: Three times a day (TID) | ORAL | 0 refills | Status: DC | PRN

## 2018-02-24 NOTE — Patient Instructions (Signed)
Chronic Obstructive Pulmonary Disease  Chronic obstructive pulmonary disease (COPD) is a long-term (chronic) condition that affects the lungs. COPD is a general term that can be used to describe many different lung problems that cause lung swelling (inflammation) and limit airflow, including chronic bronchitis and emphysema. If you have COPD, your lung function will probably never return to normal. In most cases, it gets worse over time. However, there are steps you can take to slow the progression of the disease and improve your quality of life. What are the causes? This condition may be caused by:  Smoking. This is the most common cause.  Certain genes passed down through families. What increases the risk? The following factors may make you more likely to develop this condition:  Secondhand smoke from cigarettes, pipes, or cigars.  Exposure to chemicals and other irritants such as fumes and dust in the work environment.  Chronic lung conditions or infections. What are the signs or symptoms? Symptoms of this condition include:  Shortness of breath, especially during physical activity.  Chronic cough with a large amount of thick mucus. Sometimes the cough may not have any mucus (dry cough).  Wheezing.  Rapid breaths.  Gray or bluish discoloration (cyanosis) of the skin, especially in your fingers, toes, or lips.  Feeling tired (fatigue).  Weight loss.  Chest tightness.  Frequent infections.  Episodes when breathing symptoms become much worse (exacerbations).  Swelling in the ankles, feet, or legs. This may occur in later stages of the disease. How is this diagnosed? This condition is diagnosed based on:  Your medical history.  A physical exam. You may also have tests, including:  Lung (pulmonary) function tests. This may include a spirometry test, which measures your ability to exhale properly.  Chest X-ray.  CT scan.  Blood tests. How is this treated? This  condition may be treated with:  Medicines. These may include inhaled rescue medicines to treat acute exacerbations as well as long-term, or maintenance, medicines to prevent flare-ups of COPD. ? Bronchodilators help treat COPD by dilating the airways to allow increased airflow and make your breathing more comfortable. ? Steroids can reduce airway inflammation and help prevent exacerbations.  Smoking cessation. If you smoke, your health care provider may ask you to quit, and may also recommend therapy or replacement products to help you quit.  Pulmonary rehabilitation. This may involve working with a team of health care providers and specialists, such as respiratory, occupational, and physical therapists.  Exercise and physical activity. These are beneficial for nearly all people with COPD.  Nutrition therapy to gain weight, if you are underweight.  Oxygen. Supplemental oxygen therapy is only helpful if you have a low oxygen level in your blood (hypoxemia).  Lung surgery or transplant.  Palliative care. This is to help people with COPD feel comfortable when treatment is no longer working. Follow these instructions at home: Medicines  Take over-the-counter and prescription medicines (inhaled or pills) only as told by your health care provider.  Talk to your health care provider before taking any cough or allergy medicines. You may need to avoid certain medicines that dry out your airways. Lifestyle  If you are a smoker, the most important thing that you can do is to stop smoking. Do not use any products that contain nicotine or tobacco, such as cigarettes and e-cigarettes. If you need help quitting, ask your health care provider. Continuing to smoke will cause the disease to progress faster.  Avoid exposure to things that irritate your   lungs, such as smoke, chemicals, and fumes.  Stay active, but balance activity with periods of rest. Exercise and physical activity will help you maintain  your ability to do things you want to do.  Learn and use relaxation techniques to manage stress and to control your breathing.  Get the right amount of sleep and get quality sleep. Most adults need 7 or more hours per night.  Eat healthy foods. Eating smaller, more frequent meals and resting before meals may help you maintain your strength. Controlled breathing Learn and use controlled breathing techniques as directed by your health care provider. Controlled breathing techniques include:  Pursed lip breathing. Start by breathing in (inhaling) through your nose for 1 second. Then, purse your lips as if you were going to whistle and breathe out (exhale) through the pursed lips for 2 seconds.  Diaphragmatic breathing. Start by putting one hand on your abdomen just above your waist. Inhale slowly through your nose. The hand on your abdomen should move out. Then purse your lips and exhale slowly. You should be able to feel the hand on your abdomen moving in as you exhale. Controlled coughing Learn and use controlled coughing to clear mucus from your lungs. Controlled coughing is a series of short, progressive coughs. The steps of controlled coughing are: 1. Lean your head slightly forward. 2. Breathe in deeply using diaphragmatic breathing. 3. Try to hold your breath for 3 seconds. 4. Keep your mouth slightly open while coughing twice. 5. Spit any mucus out into a tissue. 6. Rest and repeat the steps once or twice as needed. General instructions  Make sure you receive all the vaccines that your health care provider recommends, especially the pneumococcal and influenza vaccines. Preventing infection and hospitalization is very important when you have COPD.  Use oxygen therapy and pulmonary rehabilitation if directed to by your health care provider. If you require home oxygen therapy, ask your health care provider whether you should purchase a pulse oximeter to measure your oxygen level at  home.  Work with your health care provider to develop a COPD action plan. This will help you know what steps to take if your condition gets worse.  Keep other chronic health conditions under control as told by your health care provider.  Avoid extreme temperature and humidity changes.  Avoid contact with people who have an illness that spreads from person to person (is contagious), such as viral infections or pneumonia.  Keep all follow-up visits as told by your health care provider. This is important. Contact a health care provider if:  You are coughing up more mucus than usual.  There is a change in the color or thickness of your mucus.  Your breathing is more labored than usual.  Your breathing is faster than usual.  You have difficulty sleeping.  You need to use your rescue medicines or inhalers more often than expected.  You have trouble doing routine activities such as getting dressed or walking around the house. Get help right away if:  You have shortness of breath while you are resting.  You have shortness of breath that prevents you from: ? Being able to talk. ? Performing your usual physical activities.  You have chest pain lasting longer than 5 minutes.  Your skin color is more blue (cyanotic) than usual.  You measure low oxygen saturations for longer than 5 minutes with a pulse oximeter.  You have a fever.  You feel too tired to breathe normally. Summary  Chronic obstructive   pulmonary disease (COPD) is a long-term (chronic) condition that affects the lungs.  Your lung function will probably never return to normal. In most cases, it gets worse over time. However, there are steps you can take to slow the progression of the disease and improve your quality of life.  Treatment for COPD may include taking medicines, quitting smoking, pulmonary rehabilitation, and changes to diet and exercise. As the disease progresses, you may need oxygen therapy, a lung  transplant, or palliative care.  To help manage your condition, do not smoke, avoid exposure to things that irritate your lungs, stay up to date on all vaccines, and follow your health care provider's instructions for taking medicines. This information is not intended to replace advice given to you by your health care provider. Make sure you discuss any questions you have with your health care provider. Document Released: 10/29/2004 Document Revised: 07/15/2016 Document Reviewed: 02/24/2016 [employer] Interactive Patient Education  2019 [employer]  Chronic Bronchitis Chronic bronchitis is long-lasting inflammation of the tubes that carry air into your lungs (bronchial tubes). This is inflammation that occurs:  On most days of the week.  For at least three months at a time.  Over a period of two years in a row. When the bronchial tubes are inflamed, they start to produce mucus. The inflammation and buildup of mucus make it more difficult to breathe. Chronic bronchitis is usually a permanent problem. It is one type of chronic obstructive pulmonary disease (COPD). People with chronic bronchitis are more likely to get frequent colds or respiratory infections. What are the causes? Chronic bronchitis most often occurs in people who:  Have chronic, severe asthma.  Have a history of smoking.  Have asthma and smoke.  Have certain lung diseases.  Have had long-term exposure to certain irritating fumes or chemicals. What are the signs or symptoms? Symptoms of chronic bronchitis may include:  A cough that brings up mucus (productive cough).  Shortness of breath.  Loud breathing (wheezing).  Chest discomfort.  Frequent (recurring) colds or respiratory infections. Certain things can trigger chronic bronchitis symptoms or make them worse, such as:  Infections.  Stopping certain medicines.  Smoking.  Exposure to chemicals. How is this diagnosed? This condition may be diagnosed  based on:  Your symptoms and medical history.  A physical exam.  A chest X-ray.  Lung (pulmonary) function tests. How is this treated? There is no cure for chronic bronchitis, but treatment can help control your symptoms. Treatment may include:  Using a cool mist vaporizer or humidifier to make it easier to breathe.  Drinking more fluids. Drinking more makes your mucus thinner, which may make it easier to breathe.  Lifestyle changes, such as eating a healthier diet and getting more exercise.  Medicines, such as: ? Inhalers to improve air flow in and out of your lungs. ? Antibiotics to treat any bacterial infections you have, such as:  Lung infection (pneumonia).  Sinus infection.  A sudden, severe (acute) episode of bronchitis.  Oxygen therapy.  Preventing infections by keeping up to date on vaccinations, including the pneumonia and flu vaccines.  Pulmonary rehabilitation. This is a program that helps you manage your breathing problems and improve your quality of life. It may last for up to 4-12 weeks and may include exercise programs, education, counseling, and treatment support. Follow these instructions at home: Medicines  Take over-the-counter and prescription medicines only as told by your health care provider.  If you were prescribed an antibiotic medicine, take  it as told by your health care provider. Do not stop taking the antibiotic even if you start to feel better. Preventing infections  Get vaccinations as told by your health care provider. Make sure you get a flu shot (influenza vaccine) every year.  Wash your hands often with soap and water. If soap and water are not available, use hand sanitizer.  Avoid contact with people who have symptoms of a cold or the flu. Managing symptoms   Do not smoke, and avoid secondhand smoke. Exposure to cigarette smoke or irritating chemicals will make bronchitis worse. If you smoke and you need help quitting, ask your  health care provider. Quitting smoking will help your lungs heal faster.  Use an inhaler, cool mist vaporizer, or humidifier as told by your health care provider.  Avoid pollen, dust, animal dander, molds, smoke, and other things that cause shortness of breath or wheezing attacks.  Use oxygen therapy at home as directed. Follow instructions from your health care provider about how to use oxygen safely and take precautions to prevent fire. Make sure you never smoke while using oxygen or allow others to smoke in your home.  Do not wait to get medical care if you have any concerning symptoms or trouble breathing. Waiting could cause permanent injury and may be life threatening. General instructions  Talk with your health care provider about what activities are safe for you and about possible exercise routines. Regular exercise is very important to help you feel better.  Drink enough fluids to keep your urine pale yellow.  Keep all follow-up visits as told by your health care provider. This is important. Contact a health care provider if:  You have coughing or shortness of breath that gets worse.  You have muscle aches.  You have chest pain.  Your mucus seems to get thicker.  Your mucus changes from clear or white to yellow, green, gray, or bloody. Get help right away if:  Your usual medicines do not stop your wheezing.  You have severe difficulty breathing. These symptoms may represent a serious problem that is an emergency. Do not wait to see if the symptoms will go away. Get medical help right away. Call your local emergency services (911 in the [place] Do not drive yourself to the hospital. Summary  Chronic bronchitis is long-lasting inflammation of the tubes that carry air into your lungs (bronchial tubes).  Chronic bronchitis is usually a permanent problem. It is one type of chronic obstructive pulmonary disease (COPD).  There is no cure for chronic bronchitis, but treatment  can help control your symptoms.  Do not smoke, and avoid secondhand smoke. Exposure to cigarette smoke or irritating chemicals will make bronchitis worse. This information is not intended to replace advice given to you by your health care provider. Make sure you discuss any questions you have with your health care provider. Document Released: 11/06/2005 Document Revised: 12/09/2016 Document Reviewed: 12/09/2016 [employer] Interactive Patient Education  2019 [employer].

## 2018-02-24 NOTE — Progress Notes (Signed)
Chief Complaint  Patient presents with  . Cough    started 1 month ago , dry cough.   F/u with husband  1. H/o COPD c/o dry cough x 1 month worse at night and also c/o sob with exertion. Pulm Dr. [place] Bells. Pt cant tolerate otc cough meds. No sick contacts or fever 2. H/o valvular heart disease c/o sob with exertion echo remotely will repeat today  3. Postponed left shoulder surgery until the spring of note   Review of Systems  Constitutional: Negative for fever and weight loss.  HENT: Negative for hearing loss.   Eyes: Negative for blurred vision.  Respiratory: Positive for cough and shortness of breath. Negative for sputum production and wheezing.   Cardiovascular: Negative for chest pain.  Skin: Negative for rash.  Neurological: Negative for headaches.  Psychiatric/Behavioral: Negative for memory loss.   Past Medical History:  Diagnosis Date  . Anxiety   . Asthma   . Cataract    s/p b/l repair   . Chicken pox   . CKD (chronic kidney disease)   . CKD (chronic kidney disease), stage III ([place])    a. s/p R nephrectomy.  . Conversion disorder   . COPD (chronic obstructive pulmonary disease) ([place])   . Depression   . Essential hypertension   . GERD (gastroesophageal reflux disease)   . Hyperlipidemia   . Inflammatory arthritis    a. hands/carpal tunnel.  b. Low titer rheumatoid factor. c. Negative anti-CCP antibodies. d. Plaquenil.  . Non-Obstructive CAD    a. 07/2009 Cath ([facility]): nonobs dzs;  b. 03/2011 Cath [facility]): nonobs dzs.  . Osteoarthritis    a. Knees.  . PUD (peptic ulcer disease)   . S/P right hip fracture    11/01/16 s/p repair  . Spinal stenosis at L4-L5 level    severe with L4/L5 anterolisthesis grade 1 anterolisthesis   . Toxic maculopathy   . Valvular heart disease    a. 07/2015 Echo: EF 55-60%, Mild AI, AS, MR, and TR.   Past Surgical History:  Procedure Laterality Date  . APPENDECTOMY    . BACK SURGERY    . BUNIONECTOMY Right   . CATARACT EXTRACTION,  BILATERAL    . CESAREAN SECTION     x1  . CHOLECYSTECTOMY N/A 05/11/2016   Procedure: LAPAROSCOPIC CHOLECYSTECTOMY;  Surgeon: Florene Glen, MD;  Location: [facility] ORS;  Service: General;  Laterality: N/A;  . COLONOSCOPY WITH PROPOFOL N/A 04/02/2016   Procedure: COLONOSCOPY WITH PROPOFOL;  Surgeon: Jonathon Bellows, MD;  Location: [facility] ENDOSCOPY;  Service: Endoscopy;  Laterality: N/A;  . ENDOSCOPIC RETROGRADE CHOLANGIOPANCREATOGRAPHY (ERCP) WITH PROPOFOL N/A 05/08/2016   Procedure: ENDOSCOPIC RETROGRADE CHOLANGIOPANCREATOGRAPHY (ERCP) WITH PROPOFOL;  Surgeon: Lucilla Lame, MD;  Location: [facility] ENDOSCOPY;  Service: Endoscopy;  Laterality: N/A;  . ERCP     with biliary spincterotomy 05/08/16 Dr. Allen Norris for choledocholithiasis   . ESOPHAGEAL DILATION  04/02/2016   Procedure: ESOPHAGEAL DILATION;  Surgeon: Jonathon Bellows, MD;  Location: [facility] ENDOSCOPY;  Service: Endoscopy;;  . ESOPHAGOGASTRODUODENOSCOPY (EGD) WITH PROPOFOL N/A 04/02/2016   Procedure: ESOPHAGOGASTRODUODENOSCOPY (EGD) WITH PROPOFOL;  Surgeon: Jonathon Bellows, MD;  Location: [facility] ENDOSCOPY;  Service: Endoscopy;  Laterality: N/A;  . HIP ARTHROPLASTY Right 11/01/2016   Procedure: ARTHROPLASTY BIPOLAR HIP (HEMIARTHROPLASTY);  Surgeon: Corky Mull, MD;  Location: [facility] ORS;  Service: Orthopedics;  Laterality: Right;  . 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 osteoporosis     noted DEXA 08/19/16   .  REPLACEMENT TOTAL KNEE Right   . REVERSE SHOULDER ARTHROPLASTY Right 11/04/2017   Procedure: REVERSE SHOULDER ARTHROPLASTY;  Surgeon: Corky Mull, MD;  Location: [facility] ORS;  Service: Orthopedics;  Laterality: Right;  . TONSILLECTOMY    . TOTAL HIP ARTHROPLASTY  12/10/11   [facility] left hip  . TOTAL HIP ARTHROPLASTY Bilateral   . TUBAL LIGATION     Family History  Problem Relation Age of Onset  . Rheum arthritis Mother   . Asthma Mother   . Parkinson's disease Mother   . Heart disease Mother   . Stroke Mother   .  Hypertension Mother   . Heart attack Father   . Heart disease Father   . Hypertension Father   . Diabetes Son   . Gout Son   . Asthma Sister   . Heart disease Sister   . Lung cancer Sister   . Heart disease Sister   . Heart disease Sister   . Breast cancer Sister   . Heart attack Sister   . Heart disease Brother   . Heart disease Maternal Grandmother   . Diabetes Maternal Grandmother   . Colon cancer Maternal Grandmother   . Cancer Maternal Grandmother        Hodgkins lymphoma  . Heart disease Brother   . Alcohol abuse Brother   . Depression Brother   . Dementia Son    Social History   Socioeconomic History  . Marital status: Married    Spouse name: richard  . Number of children: 2  . Years of education: Some Coll  . Highest education level: Some college, no degree  Occupational History  . Occupation: Retired    Comment: retired  Scientific laboratory technician  . Financial resource strain: Very hard  . Food insecurity:    Worry: Often true    Inability: Often true  . Transportation needs:    Medical: No    Non-medical: No  Tobacco Use  . Smoking status: Former Smoker    Packs/day: 0.50    Years: 20.00    Pack years: 10.00    Types: Cigarettes    Last attempt to quit: 02/02/1974    Years since quitting: 44.0  . Smokeless tobacco: Never Used  Substance and Sexual Activity  . Alcohol use: No  . Drug use: No  . Sexual activity: Not Currently  Lifestyle  . Physical activity:    Days per week: 0 days    Minutes per session: 0 min  . Stress: Not on file  Relationships  . Social connections:    Talks on phone: More than three times a week    Gets together: Never    Attends religious service: More than 4 times per year    Active member of club or organization: No    Attends meetings of clubs or organizations: Never    Relationship status: Married  . Intimate partner violence:    Fear of current or ex partner: No    Emotionally abused: No    Physically abused: No    Forced  sexual activity: No  Other Topics Concern  . Not on file  Social History Narrative   Lives at home in [place] with her husband and grandson.   Right-handed.   6 cups coffee per day.   Current Meds  Medication Sig  . albuterol (VENTOLIN HFA) 108 (90 Base) MCG/ACT inhaler Inhale 2 puffs into the lungs every 6 (six) hours as needed. (Patient taking differently: Inhale 2 puffs into the  lungs every 6 (six) hours as needed for wheezing or shortness of breath. )  . ALPRAZolam (XANAX) 0.25 MG tablet Take 1 tablet (0.25 mg total) by mouth at bedtime as needed for anxiety.  . clotrimazole (LOTRIMIN) 1 % cream Apply 1 application topically 2 (two) times daily as needed (for irritation).  Marland Kitchen escitalopram (LEXAPRO) 10 MG tablet Take 1 tablet (10 mg total) by mouth daily.  . famotidine (PEPCID) 20 MG tablet Take 1 tablet (20 mg total) by mouth daily before breakfast.  . fluticasone furoate-vilanterol (BREO ELLIPTA) 100-25 MCG/INH AEPB Inhale 1 puff into the lungs daily. Rinse mouth  . gabapentin (NEURONTIN) 300 MG capsule Take 2 capsules (600 mg total) by mouth 2 (two) times daily.  Marland Kitchen lamoTRIgine (LAMICTAL) 100 MG tablet Take 1 tablet (100 mg total) by mouth 2 (two) times daily.  Marland Kitchen leflunomide (ARAVA) 20 MG tablet Take 1 tablet (20 mg total) by mouth daily.  Marland Kitchen lovastatin (MEVACOR) 40 MG tablet Take 1 tablet (40 mg total) by mouth at bedtime.  . Melatonin 10 MG TABS Take 10 mg by mouth at bedtime. (Patient taking differently: Take 12 mg by mouth at bedtime. )  . mirabegron ER (MYRBETRIQ) 50 MG TB24 tablet Take 50 mg by mouth daily.  . montelukast (SINGULAIR) 10 MG tablet Take 1 tablet (10 mg total) by mouth daily.  . multivitamin-lutein (OCUVITE-LUTEIN) CAPS capsule Take 1 capsule by mouth 2 (two) times daily.  . mupirocin ointment (BACTROBAN) 2 % Apply 1 application topically 2 (two) times daily. X 1 week  . nebivolol (BYSTOLIC) 10 MG tablet Take 1 tablet (10 mg total) by mouth daily. (Patient taking  differently: Take 5 mg by mouth daily. )  . ondansetron (ZOFRAN) 4 MG tablet Take 1 tablet (4 mg total) by mouth every 8 (eight) hours as needed for nausea or vomiting.  Marland Kitchen oxyCODONE (OXY IR/ROXICODONE) 5 MG immediate release tablet Take 1-2 tablets (5-10 mg total) by mouth every 4 (four) hours as needed for breakthrough pain.  . pantoprazole (PROTONIX) 40 MG tablet Take 1 tablet (40 mg total) by mouth at bedtime. 30 minutes before food. Note reduction in frequency  . QUEtiapine (SEROQUEL) 25 MG tablet Take 1 tablet (25 mg total) by mouth at bedtime.  . sucralfate (CARAFATE) 1 g tablet TAKE 1 TABLET BY MOUTH 4 TIMES A DAY WITH MEALS AND AT BEDTIME (Patient taking differently: Take 1 g by mouth 2 (two) times daily. )  . Teriparatide, Recombinant, (FORTEO) 600 MCG/2.4ML SOLN Inject 0.08 mLs (20 mcg total) into the skin daily.  . traMADol (ULTRAM) 50 MG tablet Take 1 tablet (50 mg total) by mouth every 6 (six) hours as needed.  . [DISCONTINUED] folic acid (FOLVITE) 1 MG tablet Take 1 mg by mouth daily.   Allergies  Allergen Reactions  . Ceftin [Cefuroxime Axetil] Anaphylaxis and Other (See Comments)    Other reaction(s): Other (See Comments) REACTION: tongue and throat swell Other Reaction: TONGUE AND THROAT SWELLING REACTION: tongue and throat swell  . Lisinopril Anaphylaxis and Other (See Comments)    REACTION: tongue and throat swelling (onset 10-10-09)  . Morphine Other (See Comments)    Per patient, low blood pressure issues, following morphine, that requires action to raise it back up. Drop in BP - can take small infrequent doses  . Sulfasalazine Anaphylaxis and Other (See Comments)    Swelling tongue, lips, and jaws  . Aspirin Other (See Comments)    Sulfasalazine allergy cross reacts  . Antihistamines, Chlorpheniramine-Type Other (  See Comments)    REACTION: makes pt hyper  . Antivert [Meclizine Hcl] Other (See Comments)    REACTION: bladder will not empty  . Decongestant  [Pseudoephedrine Hcl] Other (See Comments)    Other reaction(s): Unknown REACTION: makes pt hyper REACTION: makes pt hyper  . Doxycycline Other (See Comments)    REACTION: GI upset  . Morphine And Related Other (See Comments)    Per patient, low blood pressure issues, following morphine, that requires action to raise it back up.  . Polymyxin B Other (See Comments)    Medication was in eye drops.  . Pseudoephedrine Hcl Other (See Comments)    REACTION: makes pt hyper  . Sulfa Antibiotics Other (See Comments)    Face swelling  . Xarelto [Rivaroxaban] Other (See Comments)    Other reaction(s): Other (See Comments) GI bleed Stomach burning, bleeding, and tar in stool GI bleed  . Adhesive [Tape] Rash  . Iodine Hives and Rash    Per patient allergy is to contrast dye only, she is able to use betadine scrubs.  [place] [Levofloxacin In D5w] Rash  . Tetanus Toxoid Adsorbed Other (See Comments)    REACTION: rash, fever, hot to touch at injection site  . Tetanus Toxoids Other (See Comments)    REACTION: rash, fever, hot to touch at injection site   Recent Results (from the past 2160 hour(s))  Lipid panel     Status: Abnormal   Collection Time: 01/21/18  8:37 AM  Result Value Ref Range   Cholesterol 144 0 - 200 mg/dL    Comment: ATP III Classification       Desirable:  < 200 mg/dL               Borderline High:  200 - 239 mg/dL          High:  > = 240 mg/dL   Triglycerides 255.0 (H) 0.0 - 149.0 mg/dL    Comment: Normal:  <150 mg/dLBorderline High:  150 - 199 mg/dL   HDL 40.50 >39.00 mg/dL   VLDL 51.0 (H) 0.0 - 40.0 mg/dL   Total CHOL/HDL Ratio 4     Comment:                Men          Women1/2 Average Risk     3.4          3.3Average Risk          5.0          4.42X Average Risk          9.6          7.13X Average Risk          15.0          11.0                       NonHDL 103.01     Comment: NOTE:  Non-HDL goal should be 30 mg/dL higher than patient's LDL goal (i.e. LDL goal of <  70 mg/dL, would have non-HDL goal of < 100 mg/dL)  CBC with Differential/Platelet     Status: Abnormal   Collection Time: 01/21/18  8:37 AM  Result Value Ref Range   WBC 8.5 4.0 - 10.5 K/uL   RBC 4.54 3.87 - 5.11 Mil/uL   Hemoglobin 12.9 12.0 - 15.0 g/dL   HCT 39.6 36.0 - 46.0 %   MCV 87.3 78.0 - 100.0 fl  MCHC 32.5 30.0 - 36.0 g/dL   RDW 14.8 11.5 - 15.5 %   Platelets 236.0 150.0 - 400.0 K/uL   Neutrophils Relative % 58.6 43.0 - 77.0 %   Lymphocytes Relative 24.2 12.0 - 46.0 %   Monocytes Relative 9.2 3.0 - 12.0 %   Eosinophils Relative 7.1 (H) 0.0 - 5.0 %   Basophils Relative 0.9 0.0 - 3.0 %   Neutro Abs 5.0 1.4 - 7.7 K/uL   Lymphs Abs 2.1 0.7 - 4.0 K/uL   Monocytes Absolute 0.8 0.1 - 1.0 K/uL   Eosinophils Absolute 0.6 0.0 - 0.7 K/uL   Basophils Absolute 0.1 0.0 - 0.1 K/uL  LDL cholesterol, direct     Status: None   Collection Time: 01/21/18  8:37 AM  Result Value Ref Range   Direct LDL 60.0 mg/dL    Comment: Optimal:  <100 mg/dLNear or Above Optimal:  100-129 mg/dLBorderline High:  130-159 mg/dLHigh:  160-189 mg/dLVery High:  >190 mg/dL   Objective  Body mass index is 32.88 kg/m. Wt Readings from Last 3 Encounters:  02/24/18 174 lb (78.9 kg)  01/20/18 174 lb 9.6 oz (79.2 kg)  11/04/17 167 lb 3.2 oz (75.8 kg)   Temp Readings from Last 3 Encounters:  02/24/18 97.9 F (36.6 C) (Oral)  01/20/18 97.8 F (36.6 C) (Oral)  11/05/17 98.3 F (36.8 C) (Oral)   BP Readings from Last 3 Encounters:  02/24/18 130/70  01/20/18 130/78  11/05/17 (!) 148/66   Pulse Readings from Last 3 Encounters:  02/24/18 69  01/20/18 78  11/05/17 79    Physical Exam Vitals signs and nursing note reviewed.  Constitutional:      Appearance: Normal appearance. She is well-developed and well-groomed.  HENT:     Head: Normocephalic and atraumatic.     Nose: Nose normal.     Mouth/Throat:     Mouth: Mucous membranes are moist.     Pharynx: Oropharynx is clear.  Eyes:      Conjunctiva/sclera: Conjunctivae normal.     Pupils: Pupils are equal, round, and reactive to light.  Cardiovascular:     Rate and Rhythm: Normal rate and regular rhythm.     Heart sounds: Murmur present.  Pulmonary:     Effort: Pulmonary effort is normal.     Breath sounds: Normal breath sounds.  Skin:    General: Skin is warm and dry.  Neurological:     General: No focal deficit present.     Mental Status: She is alert. Mental status is at baseline.     Gait: Gait normal.  Psychiatric:        Attention and Perception: Attention and perception normal.        Mood and Affect: Mood and affect normal.        Speech: Speech normal.        Behavior: Behavior normal. Behavior is cooperative.        Thought Content: Thought content normal.        Cognition and Memory: Cognition and memory normal.        Judgment: Judgment normal.     Assessment   1. Cough with h/o copd/bronchitis. Not c/w infection likely COPD uncontrolled  2. Sob with exertion with h/o valvular heart disease  3. Hm  Plan  1. Add spriiva  Will notify pulm cont breo prn albuterol  Tessalon perles, hycodan  Will update pulm Consider CXR if not improving  2. Repeat echo to eval valves  3.  Had fluper pt had at pharmacy, prevnar, pna 23 utd allergic to Tdap declines shingrix   mammo 01/19/17 negativeordered due 01/19/18 pt needs to call and sch  Colonoscopy 04/02/16 multiple polyps but tortuous colon GI does not want to do any further  Out of age window pap  DEXA 08/19/16 +osteoporosis on Forteo will need to repeat 1 year after Forteo use to see improvement rheumatology following. -DEXA 10/12/17 with T score -4.1 worse on forteo it will be 2 years as of 09/2018 - will f/u endocrine and rheumatology and will ask both what the rec should be   Seeing dermatology in [facility] specialist 08/2017  rheumaappt 01/17/18 left shoulder surgery sch 02/2018 s/p right shoulder surgery C/w RA precet for Humira f/u in  4-6 weeks Dr. Precious Reel  Provider: Dr. Olivia Mackie McLean-Scocuzza-Internal Medicine

## 2018-02-25 ENCOUNTER — Encounter: Payer: Self-pay | Admitting: Internal Medicine

## 2018-03-02 ENCOUNTER — Ambulatory Visit: Payer: Medicare Other

## 2018-03-25 ENCOUNTER — Ambulatory Visit (INDEPENDENT_AMBULATORY_CARE_PROVIDER_SITE_OTHER): Payer: Medicare Other

## 2018-03-25 ENCOUNTER — Ambulatory Visit: Payer: Medicare Other | Admitting: Internal Medicine

## 2018-03-25 ENCOUNTER — Encounter: Payer: Self-pay | Admitting: Internal Medicine

## 2018-03-25 VITALS — BP 132/70 | HR 80 | Temp 98.9°F | Ht 61.0 in | Wt 173.6 lb

## 2018-03-25 DIAGNOSIS — R05 Cough: Secondary | ICD-10-CM | POA: Diagnosis not present

## 2018-03-25 DIAGNOSIS — J440 Chronic obstructive pulmonary disease with acute lower respiratory infection: Secondary | ICD-10-CM | POA: Diagnosis not present

## 2018-03-25 DIAGNOSIS — R0602 Shortness of breath: Secondary | ICD-10-CM

## 2018-03-25 DIAGNOSIS — J209 Acute bronchitis, unspecified: Secondary | ICD-10-CM

## 2018-03-25 DIAGNOSIS — R059 Cough, unspecified: Secondary | ICD-10-CM

## 2018-03-25 MED ORDER — IPRATROPIUM-ALBUTEROL 0.5-2.5 (3) MG/3ML IN SOLN: 3.0000 mL | Freq: Three times a day (TID) | RESPIRATORY_TRACT | 12 refills | Status: DC | PRN

## 2018-03-25 MED ORDER — PREDNISONE 20 MG PO TABS: 20.0000 mg | ORAL_TABLET | Freq: Every day | ORAL | 0 refills | Status: DC

## 2018-03-25 MED ORDER — FLUTICASONE FUROATE-VILANTEROL 200-25 MCG/INH IN AEPB: 1.0000 | INHALATION_SPRAY | Freq: Every day | RESPIRATORY_TRACT | 12 refills | Status: DC

## 2018-03-25 MED ORDER — HYDROCODONE-HOMATROPINE 5-1.5 MG/5ML PO SYRP: 5.0000 mL | ORAL_SOLUTION | Freq: Every evening | ORAL | 0 refills | Status: DC | PRN

## 2018-03-25 MED ORDER — AMOXICILLIN-POT CLAVULANATE 875-125 MG PO TABS: 1.0000 | ORAL_TABLET | Freq: Two times a day (BID) | ORAL | 0 refills | Status: DC

## 2018-03-25 MED ORDER — METHYLPREDNISOLONE ACETATE 40 MG/ML IJ SUSP
40.0000 mg | Freq: Once | INTRAMUSCULAR | Status: AC
  Administered 2018-03-25: 40 mg via INTRAMUSCULAR

## 2018-03-25 NOTE — Progress Notes (Signed)
Pre visit review using our clinic review tool, if applicable. No additional management support is needed unless otherwise documented below in the visit note. 

## 2018-03-25 NOTE — Patient Instructions (Signed)

## 2018-03-25 NOTE — Progress Notes (Signed)
Chief Complaint  Patient presents with  . Cough   Acute visit cough worse yesterday not able to get up phelgm, c/o sob esp with exertion sx's worse with cold. On Breo 100/25, prn Albuterol, singulair, spiriva  Nothing tried but hycodan    Review of Systems  Constitutional: Negative for fever.  HENT: Negative for hearing loss.   Eyes: Negative for blurred vision.  Respiratory: Positive for cough, shortness of breath and wheezing. Negative for sputum production.   Cardiovascular: Negative for chest pain.  Skin: Negative for rash.  Psychiatric/Behavioral: Negative for memory loss.   Past Medical History:  Diagnosis Date  . Anxiety   . Asthma   . Cataract    s/p b/l repair   . Chicken pox   . CKD (chronic kidney disease)   . CKD (chronic kidney disease), stage III ([place])    a. s/p R nephrectomy.  . Conversion disorder   . COPD (chronic obstructive pulmonary disease) ([place])   . Depression   . Essential hypertension   . GERD (gastroesophageal reflux disease)   . Hyperlipidemia   . Inflammatory arthritis    a. hands/carpal tunnel.  b. Low titer rheumatoid factor. c. Negative anti-CCP antibodies. d. Plaquenil.  . Non-Obstructive CAD    a. 07/2009 Cath ([facility]): nonobs dzs;  b. 03/2011 Cath [facility]): nonobs dzs.  . Osteoarthritis    a. Knees.  . PUD (peptic ulcer disease)   . S/P right hip fracture    11/01/16 s/p repair  . Spinal stenosis at L4-L5 level    severe with L4/L5 anterolisthesis grade 1 anterolisthesis   . Toxic maculopathy   . Valvular heart disease    a. 07/2015 Echo: EF 55-60%, Mild AI, AS, MR, and TR.   Past Surgical History:  Procedure Laterality Date  . APPENDECTOMY    . BACK SURGERY    . BUNIONECTOMY Right   . CATARACT EXTRACTION, BILATERAL    . CESAREAN SECTION     x1  . CHOLECYSTECTOMY N/A 05/11/2016   Procedure: LAPAROSCOPIC CHOLECYSTECTOMY;  Surgeon: Florene Glen, MD;  Location: [facility] ORS;  Service: General;  Laterality: N/A;  . COLONOSCOPY WITH PROPOFOL  N/A 04/02/2016   Procedure: COLONOSCOPY WITH PROPOFOL;  Surgeon: Jonathon Bellows, MD;  Location: [facility] ENDOSCOPY;  Service: Endoscopy;  Laterality: N/A;  . ENDOSCOPIC RETROGRADE CHOLANGIOPANCREATOGRAPHY (ERCP) WITH PROPOFOL N/A 05/08/2016   Procedure: ENDOSCOPIC RETROGRADE CHOLANGIOPANCREATOGRAPHY (ERCP) WITH PROPOFOL;  Surgeon: Lucilla Lame, MD;  Location: [facility] ENDOSCOPY;  Service: Endoscopy;  Laterality: N/A;  . ERCP     with biliary spincterotomy 05/08/16 Dr. Allen Norris for choledocholithiasis   . ESOPHAGEAL DILATION  04/02/2016   Procedure: ESOPHAGEAL DILATION;  Surgeon: Jonathon Bellows, MD;  Location: [facility] ENDOS[facility];  Service: Endoscopy;;  . ESOPHAGOGASTRODUODENOSCOPY (EGD) WITH PROPOFOL N/A 04/02/2016   Procedure: ESOPHAGOGASTRODUODENOSCOPY (EGD) WITH PROPOFOL;  Surgeon: Jonathon Bellows, MD;  Location: [facility] ENDOSCOPY;  Service: Endoscopy;  Laterality: N/A;  . HIP ARTHROPLASTY Right 11/01/2016   Procedure: ARTHROPLASTY BIPOLAR HIP (HEMIARTHROPLASTY);  Surgeon: Corky Mull, MD;  Location: [facility] ORS;  Service: Orthopedics;  Laterality: Right;  . 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 osteoporosis     noted DEXA 08/19/16   . REPLACEMENT TOTAL KNEE Right   . REVERSE SHOULDER ARTHROPLASTY Right 11/04/2017   Procedure: REVERSE SHOULDER ARTHROPLASTY;  Surgeon: Corky Mull, MD;  Location: [facility] ORS;  Service: Orthopedics;  Laterality: Right;  . TONSILLECTOMY    . TOTAL HIP ARTHROPLASTY  12/10/11   [facility] left hip  . TOTAL HIP ARTHROPLASTY Bilateral   . TUBAL LIGATION     Family History  Problem Relation Age of Onset  . Rheum arthritis Mother   . Asthma Mother   . Parkinson's disease Mother   . Heart disease Mother   . Stroke Mother   . Hypertension Mother   . Heart attack Father   . Heart disease Father   . Hypertension Father   . Diabetes Son   . Gout Son   . Asthma Sister   . Heart disease Sister   . Lung cancer Sister   . Heart disease Sister   . Heart disease  Sister   . Breast cancer Sister   . Heart attack Sister   . Heart disease Brother   . Heart disease Maternal Grandmother   . Diabetes Maternal Grandmother   . Colon cancer Maternal Grandmother   . Cancer Maternal Grandmother        Hodgkins lymphoma  . Heart disease Brother   . Alcohol abuse Brother   . Depression Brother   . Dementia Son    Social History   Socioeconomic History  . Marital status: Married    Spouse name: richard  . Number of children: 2  . Years of education: Some Coll  . Highest education level: Some college, no degree  Occupational History  . Occupation: Retired    Comment: retired  Scientific laboratory technician  . Financial resource strain: Very hard  . Food insecurity:    Worry: Often true    Inability: Often true  . Transportation needs:    Medical: No    Non-medical: No  Tobacco Use  . Smoking status: Former Smoker    Packs/day: 0.50    Years: 20.00    Pack years: 10.00    Types: Cigarettes    Last attempt to quit: 02/02/1974    Years since quitting: 44.1  . Smokeless tobacco: Never Used  Substance and Sexual Activity  . Alcohol use: No  . Drug use: No  . Sexual activity: Not Currently  Lifestyle  . Physical activity:    Days per week: 0 days    Minutes per session: 0 min  . Stress: Not on file  Relationships  . Social connections:    Talks on phone: More than three times a week    Gets together: Never    Attends religious service: More than 4 times per year    Active member of club or organization: No    Attends meetings of clubs or organizations: Never    Relationship status: Married  . Intimate partner violence:    Fear of current or ex partner: No    Emotionally abused: No    Physically abused: No    Forced sexual activity: No  Other Topics Concern  . Not on file  Social History Narrative   Lives at home in [place] with her husband and grandson.   Right-handed.   6 cups coffee per day.   Current Meds  Medication Sig  . albuterol  (VENTOLIN HFA) 108 (90 Base) MCG/ACT inhaler Inhale 2 puffs into the lungs every 6 (six) hours as needed. (Patient taking differently: Inhale 2 puffs into the lungs every 6 (six) hours as needed for wheezing or shortness of breath. )  . ALPRAZolam (XANAX) 0.25 MG tablet Take 1 tablet (0.25 mg total) by mouth at bedtime as needed for anxiety.  . benzonatate (TESSALON) 100 MG capsule Take 1 capsule (  100 mg total) by mouth 3 (three) times daily as needed for cough.  . clotrimazole (LOTRIMIN) 1 % cream Apply 1 application topically 2 (two) times daily as needed (for irritation).  Marland Kitchen escitalopram (LEXAPRO) 10 MG tablet Take 1 tablet (10 mg total) by mouth daily.  . famotidine (PEPCID) 20 MG tablet Take 1 tablet (20 mg total) by mouth daily before breakfast.  . gabapentin (NEURONTIN) 300 MG capsule Take 2 capsules (600 mg total) by mouth 2 (two) times daily.  Marland Kitchen HYDROcodone-homatropine (HYCODAN) 5-1.5 MG/5ML syrup Take 5 mLs by mouth at bedtime as needed for cough.  . lamoTRIgine (LAMICTAL) 100 MG tablet Take 1 tablet (100 mg total) by mouth 2 (two) times daily.  Marland Kitchen leflunomide (ARAVA) 20 MG tablet Take 1 tablet (20 mg total) by mouth daily.  Marland Kitchen lovastatin (MEVACOR) 40 MG tablet Take 1 tablet (40 mg total) by mouth at bedtime.  . Melatonin 10 MG TABS Take 10 mg by mouth at bedtime. (Patient taking differently: Take 12 mg by mouth at bedtime. )  . mirabegron ER (MYRBETRIQ) 50 MG TB24 tablet Take 50 mg by mouth daily.  . montelukast (SINGULAIR) 10 MG tablet Take 1 tablet (10 mg total) by mouth daily.  . multivitamin-lutein (OCUVITE-LUTEIN) CAPS capsule Take 1 capsule by mouth 2 (two) times daily.  . mupirocin ointment (BACTROBAN) 2 % Apply 1 application topically 2 (two) times daily. X 1 week  . nebivolol (BYSTOLIC) 10 MG tablet Take 1 tablet (10 mg total) by mouth daily. (Patient taking differently: Take 5 mg by mouth daily. )  . ondansetron (ZOFRAN) 4 MG tablet Take 1 tablet (4 mg total) by mouth every 8  (eight) hours as needed for nausea or vomiting.  Marland Kitchen oxyCODONE (OXY IR/ROXICODONE) 5 MG immediate release tablet Take 1-2 tablets (5-10 mg total) by mouth every 4 (four) hours as needed for breakthrough pain.  . pantoprazole (PROTONIX) 40 MG tablet Take 1 tablet (40 mg total) by mouth at bedtime. 30 minutes before food. Note reduction in frequency  . QUEtiapine (SEROQUEL) 25 MG tablet Take 1 tablet (25 mg total) by mouth at bedtime.  . sucralfate (CARAFATE) 1 g tablet TAKE 1 TABLET BY MOUTH 4 TIMES A DAY WITH MEALS AND AT BEDTIME (Patient taking differently: Take 1 g by mouth 2 (two) times daily. )  . Teriparatide, Recombinant, (FORTEO) 600 MCG/2.4ML SOLN Inject 0.08 mLs (20 mcg total) into the skin daily.  . Tiotropium Bromide Monohydrate (SPIRIVA RESPIMAT) 2.5 MCG/ACT AERS Inhale 2 puffs into the lungs daily.  . traMADol (ULTRAM) 50 MG tablet Take 1 tablet (50 mg total) by mouth every 6 (six) hours as needed.  . [DISCONTINUED] fluticasone furoate-vilanterol (BREO ELLIPTA) 100-25 MCG/INH AEPB Inhale 1 puff into the lungs daily. Rinse mouth  . [DISCONTINUED] HYDROcodone-homatropine (HYCODAN) 5-1.5 MG/5ML syrup Take 5 mLs by mouth at bedtime as needed for cough.   Allergies  Allergen Reactions  . Ceftin [Cefuroxime Axetil] Anaphylaxis and Other (See Comments)    Other reaction(s): Other (See Comments) REACTION: tongue and throat swell Other Reaction: TONGUE AND THROAT SWELLING REACTION: tongue and throat swell  . Lisinopril Anaphylaxis and Other (See Comments)    REACTION: tongue and throat swelling (onset 10-10-09)  . Morphine Other (See Comments)    Per patient, low blood pressure issues, following morphine, that requires action to raise it back up. Drop in BP - can take small infrequent doses  . Sulfasalazine Anaphylaxis and Other (See Comments)    Swelling tongue, lips, and jaws  .  Aspirin Other (See Comments)    Sulfasalazine allergy cross reacts  . Antihistamines, Chlorpheniramine-Type  Other (See Comments)    REACTION: makes pt hyper  . Antivert [Meclizine Hcl] Other (See Comments)    REACTION: bladder will not empty  . Decongestant [Pseudoephedrine Hcl] Other (See Comments)    Other reaction(s): Unknown REACTION: makes pt hyper REACTION: makes pt hyper  . Doxycycline Other (See Comments)    REACTION: GI upset  . Morphine And Related Other (See Comments)    Per patient, low blood pressure issues, following morphine, that requires action to raise it back up.  . Polymyxin B Other (See Comments)    Medication was in eye drops.  . Pseudoephedrine Hcl Other (See Comments)    REACTION: makes pt hyper  . Sulfa Antibiotics Other (See Comments)    Face swelling  . Xarelto [Rivaroxaban] Other (See Comments)    Other reaction(s): Other (See Comments) GI bleed Stomach burning, bleeding, and tar in stool GI bleed  . Adhesive [Tape] Rash  . Iodine Hives and Rash    Per patient allergy is to contrast dye only, she is able to use betadine scrubs.  [place] [Levofloxacin In D5w] Rash  . Tetanus Toxoid Adsorbed Other (See Comments)    REACTION: rash, fever, hot to touch at injection site  . Tetanus Toxoids Other (See Comments)    REACTION: rash, fever, hot to touch at injection site   Recent Results (from the past 2160 hour(s))  Lipid panel     Status: Abnormal   Collection Time: 01/21/18  8:37 AM  Result Value Ref Range   Cholesterol 144 0 - 200 mg/dL    Comment: ATP III Classification       Desirable:  < 200 mg/dL               Borderline High:  200 - 239 mg/dL          High:  > = 240 mg/dL   Triglycerides 255.0 (H) 0.0 - 149.0 mg/dL    Comment: Normal:  <150 mg/dLBorderline High:  150 - 199 mg/dL   HDL 40.50 >39.00 mg/dL   VLDL 51.0 (H) 0.0 - 40.0 mg/dL   Total CHOL/HDL Ratio 4     Comment:                Men          Women1/2 Average Risk     3.4          3.3Average Risk          5.0          4.42X Average Risk          9.6          7.13X Average Risk          15.0           11.0                       NonHDL 103.01     Comment: NOTE:  Non-HDL goal should be 30 mg/dL higher than patient's LDL goal (i.e. LDL goal of < 70 mg/dL, would have non-HDL goal of < 100 mg/dL)  CBC with Differential/Platelet     Status: Abnormal   Collection Time: 01/21/18  8:37 AM  Result Value Ref Range   WBC 8.5 4.0 - 10.5 K/uL   RBC 4.54 3.87 - 5.11 Mil/uL   Hemoglobin 12.9 12.0 - 15.0 g/dL  HCT 39.6 36.0 - 46.0 %   MCV 87.3 78.0 - 100.0 fl   MCHC 32.5 30.0 - 36.0 g/dL   RDW 14.8 11.5 - 15.5 %   Platelets 236.0 150.0 - 400.0 K/uL   Neutrophils Relative % 58.6 43.0 - 77.0 %   Lymphocytes Relative 24.2 12.0 - 46.0 %   Monocytes Relative 9.2 3.0 - 12.0 %   Eosinophils Relative 7.1 (H) 0.0 - 5.0 %   Basophils Relative 0.9 0.0 - 3.0 %   Neutro Abs 5.0 1.4 - 7.7 K/uL   Lymphs Abs 2.1 0.7 - 4.0 K/uL   Monocytes Absolute 0.8 0.1 - 1.0 K/uL   Eosinophils Absolute 0.6 0.0 - 0.7 K/uL   Basophils Absolute 0.1 0.0 - 0.1 K/uL  LDL cholesterol, direct     Status: None   Collection Time: 01/21/18  8:37 AM  Result Value Ref Range   Direct LDL 60.0 mg/dL    Comment: Optimal:  <100 mg/dLNear or Above Optimal:  100-129 mg/dLBorderline High:  130-159 mg/dLHigh:  160-189 mg/dLVery High:  >190 mg/dL   Objective  Body mass index is 32.8 kg/m. Wt Readings from Last 3 Encounters:  03/25/18 173 lb 9.6 oz (78.7 kg)  02/24/18 174 lb (78.9 kg)  01/20/18 174 lb 9.6 oz (79.2 kg)   Temp Readings from Last 3 Encounters:  03/25/18 98.9 F (37.2 C) (Oral)  02/24/18 97.9 F (36.6 C) (Oral)  01/20/18 97.8 F (36.6 C) (Oral)   BP Readings from Last 3 Encounters:  03/25/18 132/70  02/24/18 130/70  01/20/18 130/78   Pulse Readings from Last 3 Encounters:  03/25/18 80  02/24/18 69  01/20/18 78    Physical Exam Vitals signs and nursing note reviewed.  Constitutional:      Appearance: Normal appearance. She is well-developed.  HENT:     Head: Normocephalic and atraumatic.     Nose:  Nose normal.     Mouth/Throat:     Mouth: Mucous membranes are moist.     Pharynx: Oropharynx is clear.  Eyes:     Conjunctiva/sclera: Conjunctivae normal.     Pupils: Pupils are equal, round, and reactive to light.  Cardiovascular:     Rate and Rhythm: Normal rate and regular rhythm.     Heart sounds: Murmur present.  Pulmonary:     Effort: Pulmonary effort is normal.     Breath sounds: Normal breath sounds.     Comments: Cough on exam  Skin:    General: Skin is warm and dry.  Neurological:     General: No focal deficit present.     Mental Status: She is alert and oriented to person, place, and time. Mental status is at baseline.     Gait: Gait normal.  Psychiatric:        Attention and Perception: Attention and perception normal.        Mood and Affect: Mood and affect normal.        Speech: Speech normal.        Behavior: Behavior normal. Behavior is cooperative.        Thought Content: Thought content normal.        Cognition and Memory: Cognition and memory normal.        Judgment: Judgment normal.     Assessment   1. Acute bronchitis with COPD exacerbation with h/o reactive airway disease  Plan   1. duoneb x 1  Hycodan prn  Increase breo 200/25 per last pulm note this was rec  but not sent to pharmacy consider reduce dose when sx's controlled back to 100/25, prn duoneb at home, spiriva, prn Albuterol and singulair  augmentin bid x 1 week  depomedrol 40 x 1 today  Prednisone 20 mg x 10 days CXR today  Prn O2   Provider: Dr. Olivia Mackie McLean-Scocuzza-Internal Medicine

## 2018-04-04 ENCOUNTER — Other Ambulatory Visit: Payer: Self-pay | Admitting: Urology

## 2018-04-04 ENCOUNTER — Encounter: Payer: Self-pay | Admitting: *Deleted

## 2018-04-04 MED ORDER — MIRABEGRON ER 50 MG PO TB24: 50.0000 mg | ORAL_TABLET | Freq: Every day | ORAL | 5 refills | Status: DC

## 2018-04-04 NOTE — Telephone Encounter (Signed)
Prescription sent to [facility].

## 2018-04-04 NOTE — Telephone Encounter (Signed)
Pt called and would like a new RX for Myrbetriq 50 mg sent to [facility] in [facility].

## 2018-04-07 ENCOUNTER — Ambulatory Visit: Admission: RE | Admit: 2018-04-07 | Payer: Medicare Other | Source: Ambulatory Visit

## 2018-04-13 ENCOUNTER — Other Ambulatory Visit: Payer: Self-pay | Admitting: Internal Medicine

## 2018-04-13 ENCOUNTER — Other Ambulatory Visit: Payer: Self-pay

## 2018-04-13 ENCOUNTER — Ambulatory Visit
Admission: RE | Admit: 2018-04-13 | Discharge: 2018-04-13 | Disposition: A | Discharge status: Home / Self Care | Payer: Medicare Other | Source: Ambulatory Visit | Attending: Internal Medicine | Admitting: Internal Medicine

## 2018-04-13 DIAGNOSIS — N6489 Other specified disorders of breast: Secondary | ICD-10-CM

## 2018-04-13 DIAGNOSIS — Z1231 Encounter for screening mammogram for malignant neoplasm of breast: Secondary | ICD-10-CM

## 2018-04-13 DIAGNOSIS — R928 Other abnormal and inconclusive findings on diagnostic imaging of breast: Secondary | ICD-10-CM

## 2018-04-14 ENCOUNTER — Other Ambulatory Visit: Payer: Self-pay

## 2018-04-14 ENCOUNTER — Encounter
Admission: RE | Admit: 2018-04-14 | Discharge: 2018-04-14 | Disposition: A | Discharge status: Home / Self Care | Payer: Medicare Other | Source: Ambulatory Visit | Attending: Surgery | Admitting: Surgery

## 2018-04-14 DIAGNOSIS — Z01818 Encounter for other preprocedural examination: Secondary | ICD-10-CM | POA: Diagnosis present

## 2018-04-14 DIAGNOSIS — I10 Essential (primary) hypertension: Secondary | ICD-10-CM | POA: Insufficient documentation

## 2018-04-14 HISTORY — DX: Sleep apnea, unspecified: G47.30

## 2018-04-14 HISTORY — DX: Bipolar disorder, unspecified: F31.9

## 2018-04-14 LAB — URINALYSIS, ROUTINE W REFLEX MICROSCOPIC
Bilirubin Urine: NEGATIVE
Glucose, UA: NEGATIVE mg/dL
Hgb urine dipstick: NEGATIVE
Ketones, ur: NEGATIVE mg/dL
Leukocytes,Ua: NEGATIVE
Nitrite: NEGATIVE
Protein, ur: NEGATIVE mg/dL
Specific Gravity, Urine: 1.008 (ref 1.005–1.030)
pH: 7 (ref 5.0–8.0)

## 2018-04-14 LAB — CBC
HCT: 39.7 % (ref 36.0–46.0)
Hemoglobin: 12.4 g/dL (ref 12.0–15.0)
MCH: 27.7 pg (ref 26.0–34.0)
MCHC: 31.2 g/dL (ref 30.0–36.0)
MCV: 88.8 fL (ref 80.0–100.0)
Platelets: 228 10*3/uL (ref 150–400)
RBC: 4.47 MIL/uL (ref 3.87–5.11)
RDW: 15.5 % (ref 11.5–15.5)
WBC: 11.1 10*3/uL — ABNORMAL HIGH (ref 4.0–10.5)
nRBC: 0 % (ref 0.0–0.2)

## 2018-04-14 LAB — BASIC METABOLIC PANEL
Anion gap: 9 (ref 5–15)
BUN: 20 mg/dL (ref 8–23)
CO2: 26 mmol/L (ref 22–32)
Calcium: 9.3 mg/dL (ref 8.9–10.3)
Chloride: 106 mmol/L (ref 98–111)
Creatinine, Ser: 1.2 mg/dL — ABNORMAL HIGH (ref 0.44–1.00)
GFR calc Af Amer: 51 mL/min — ABNORMAL LOW (ref >60)
GFR calc non Af Amer: 44 mL/min — ABNORMAL LOW (ref >60)
Glucose, Bld: 119 mg/dL — ABNORMAL HIGH (ref 70–99)
Potassium: 4 mmol/L (ref 3.5–5.1)
Sodium: 141 mmol/L (ref 135–145)

## 2018-04-14 LAB — TYPE AND SCREEN
ABO/RH(D): AB POS
Antibody Screen: NEGATIVE

## 2018-04-14 LAB — SURGICAL PCR SCREEN
MRSA, PCR: NEGATIVE
Staphylococcus aureus: POSITIVE — AB

## 2018-04-14 NOTE — Patient Instructions (Signed)
Your procedure is scheduled on:04/28/18 Report to Day Surgery. [facility] SECOND FLOOR To find out your arrival time please call [phone] between 1PM - 3PM on 3/215/20 Remember: Instructions that are not followed completely may result in serious medical risk,  up to and including death, or upon the discretion of your surgeon and anesthesiologist your  surgery may need to be rescheduled.     _X__ 1. Do not eat food after midnight the night before your procedure.                 No gum chewing or hard candies. You may drink clear liquids up to 2 hours                 before you are scheduled to arrive for your surgery- DO not drink clear                 liquids within 2 hours of the start of your surgery.                 Clear Liquids include:  water, apple juice without pulp, clear carbohydrate                 drink such as Clearfast of Gatorade, Black Coffee or Tea (Do not add                 anything to coffee or tea).  __X__2.  On the morning of surgery brush your teeth with toothpaste and water, you                may rinse your mouth with mouthwash if you wish.  Do not swallow any toothpaste of mouthwash.     _X__ 3.  No Alcohol for 24 hours before or after surgery.   _X__ 4.  Do Not Smoke or use e-cigarettes For 24 Hours Prior to Your Surgery.                 Do not use any chewable tobacco products for at least 6 hours prior to                 surgery.  ____  5.  Bring all medications with you on the day of surgery if instructed.   ____  6.  Notify your doctor if there is any change in your medical condition      (cold, fever, infections).     Do not wear jewelry, make-up, hairpins, clips or nail polish. Do not wear lotions, powders, or perfumes. You may wear deodorant. Do not shave 48 hours prior to surgery. Men may shave face and neck. Do not bring valuables to the hospital.    [facility] is not responsible for any belongings or  valuables.  Contacts, dentures or bridgework may not be worn into surgery. Leave your suitcase in the car. After surgery it may be brought to your room. For patients admitted to the hospital, discharge time is determined by your treatment team.   Patients discharged the day of surgery will not be allowed to drive home.   Please read over the following fact sheets that you were given:   Surgical Site Infection Prevention / MRSA         __X__ Take these medicines the morning of surgery with A SIP OF WATER:    1. PEPCID  2. LEXAPRO  3. GABAPENTIN  4. LAMOTRIGINE  5. LELFUNOMIDE  6. BYSTOLIC  7. MONTELUKAST ____ Fleet Enema (as directed)   __X__ Use CHG Soap as directed  _X___ Use inhalers on the day of surgery   AND BRING  ____ Stop metformin 2 days prior to surgery    ____ Take 1/2 of usual insulin dose the night before surgery. No insulin the morning          of surgery.   ____ Stop Coumadin/Plavix/aspirin on   _X___ Stop Anti-inflammatories on    STOP OMEGA 04/14/18 UNTIL AFTER SURGERY   __X__ Stop supplements until after surgery.  STOP MELATONIN 04/14/18 UNTIL AFTER SURGERY  ____ Bring C-Pap to the hospital.

## 2018-04-14 NOTE — Pre-Procedure Instructions (Signed)
FAXED EKG / REQUEST FOR OPTIMIZATION TO DR POGGI. PATIENT AWARE AND STATES FOR ECHO BY PCP 04/21/18

## 2018-04-14 NOTE — Pre-Procedure Instructions (Signed)
UA/ POSITIVE NASAL STAPH FAXED TO DR Roland Rack

## 2018-04-15 LAB — URINE CULTURE: Culture: <10000 — AB

## 2018-04-18 ENCOUNTER — Ambulatory Visit: Payer: Medicare Other | Admitting: Psychiatry

## 2018-04-18 IMAGING — US US TRANSVAGINAL NON-OB
1 series · 14 of 25 positions shown · non-contrast
Comparison: CT of the abdomen and pelvis 02/25/2016.

CLINICAL DATA: Left adnexal mass on CT at [place] [facility].



[Series 1: us transvaginal non-ob · 0.24mm/px · 14 of 81 slices shown]
[im 1/81]
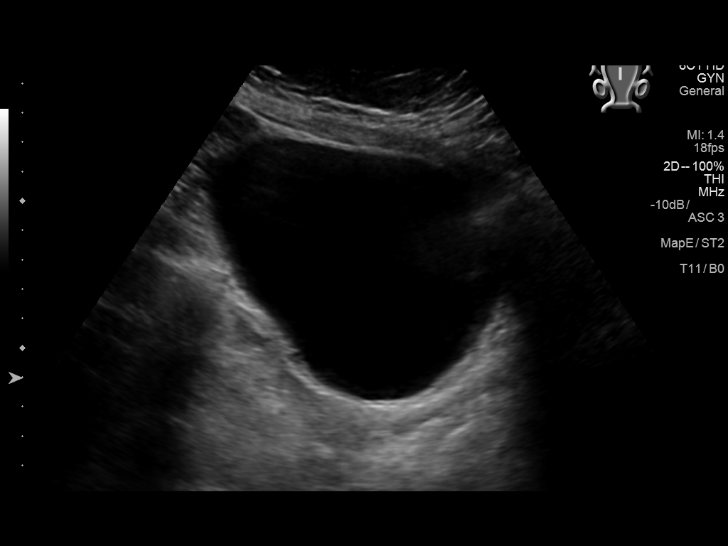
[im 7/81]
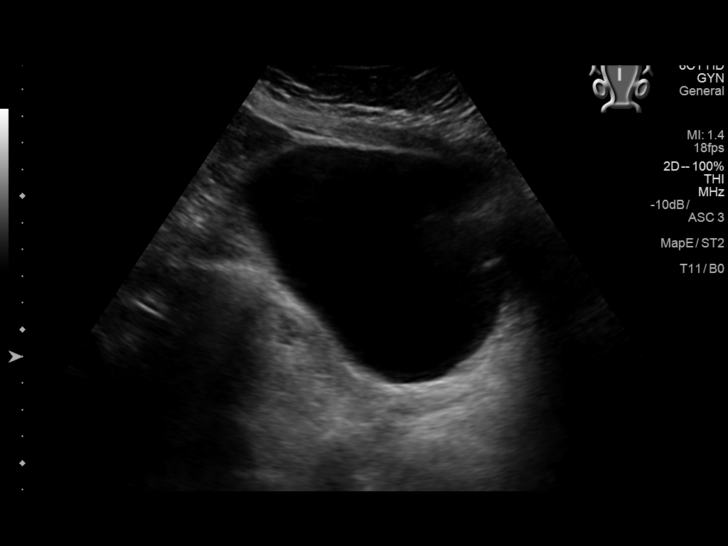
[im 14/81]
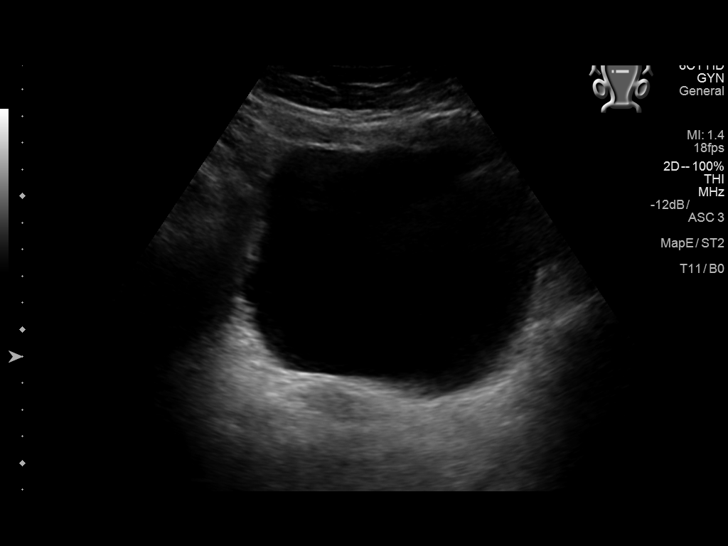
[im 21/81]
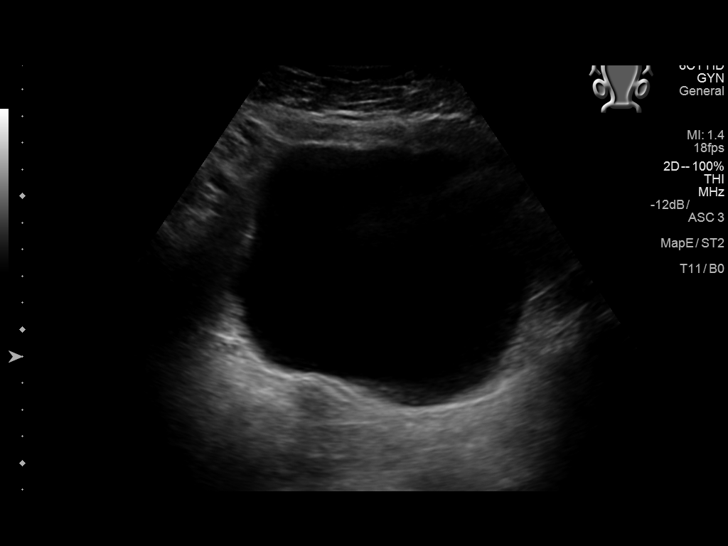
[im 27/81]
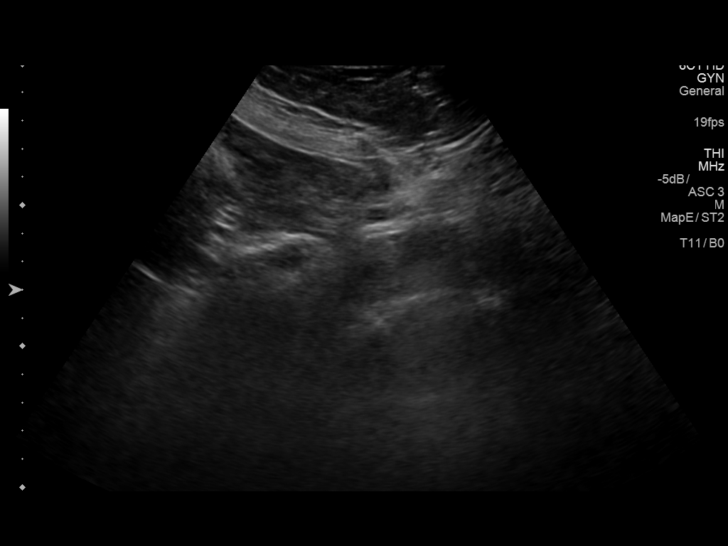
[im 31/81]
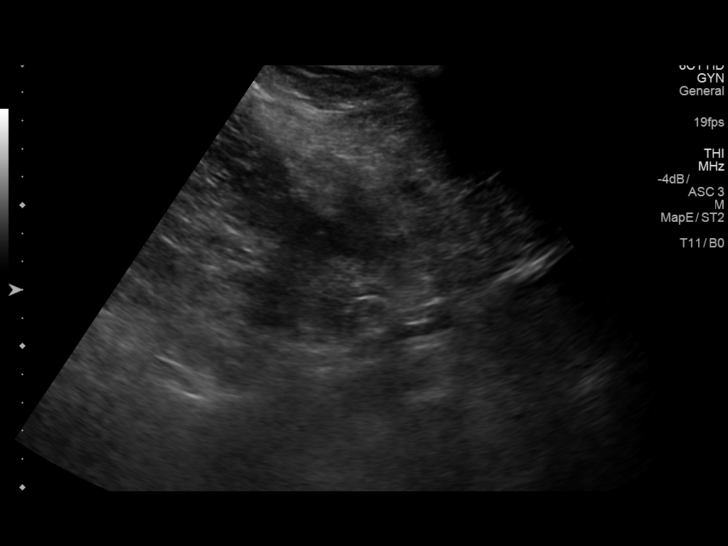
[im 37/81]
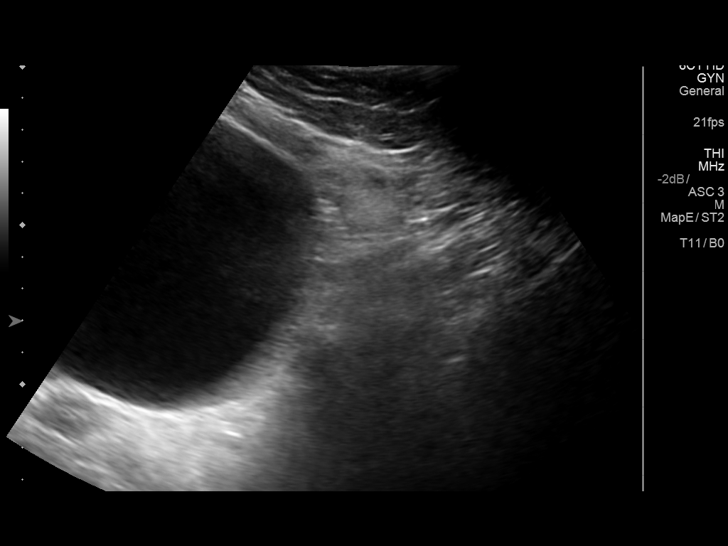
[im 44/81]
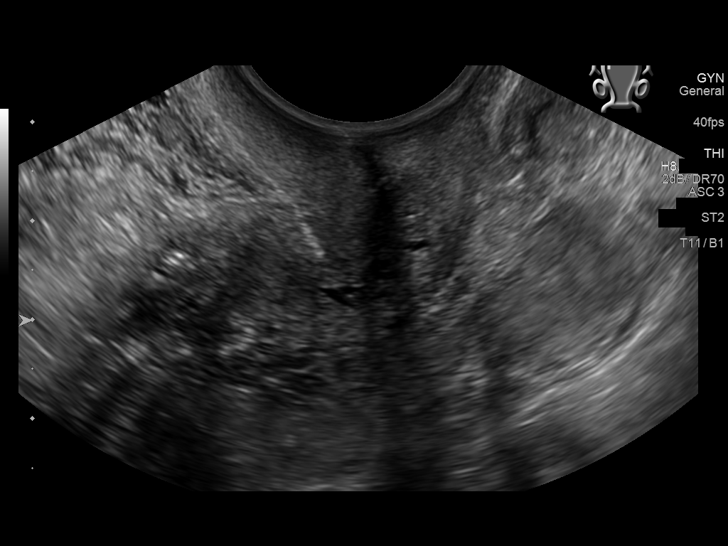
[im 51/81]
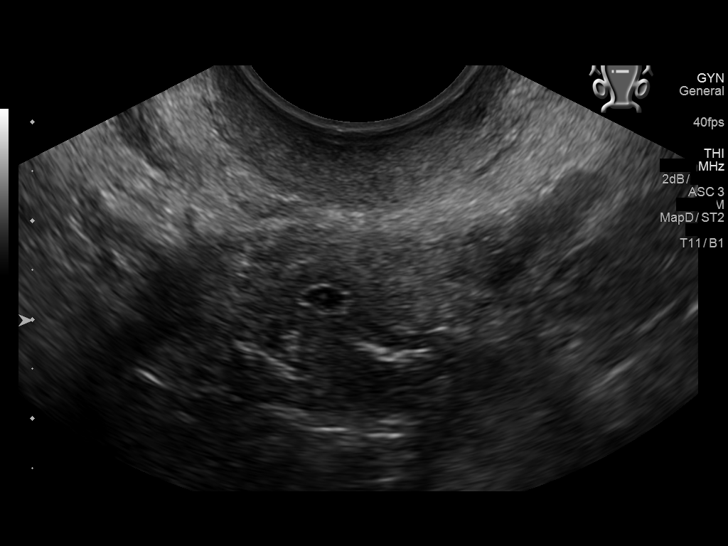
[im 54/81]
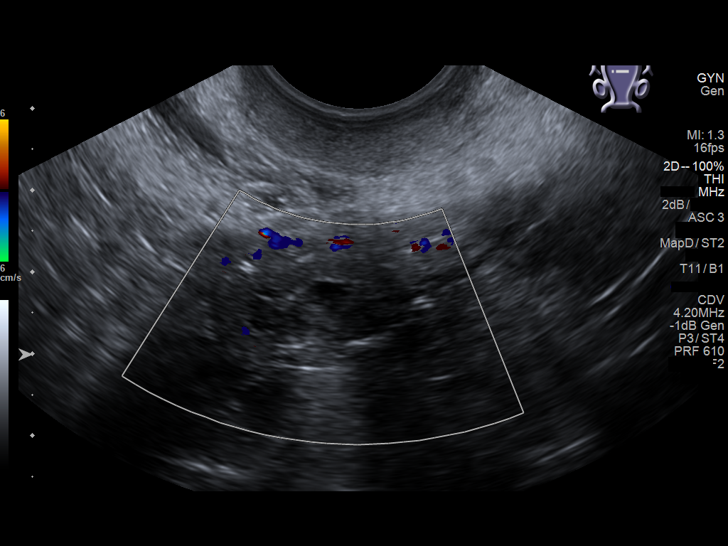
[im 61/81]
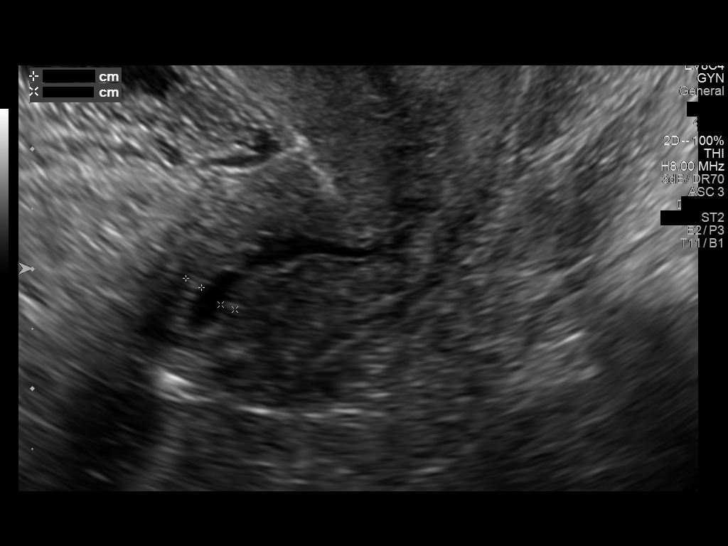
[im 67/81]
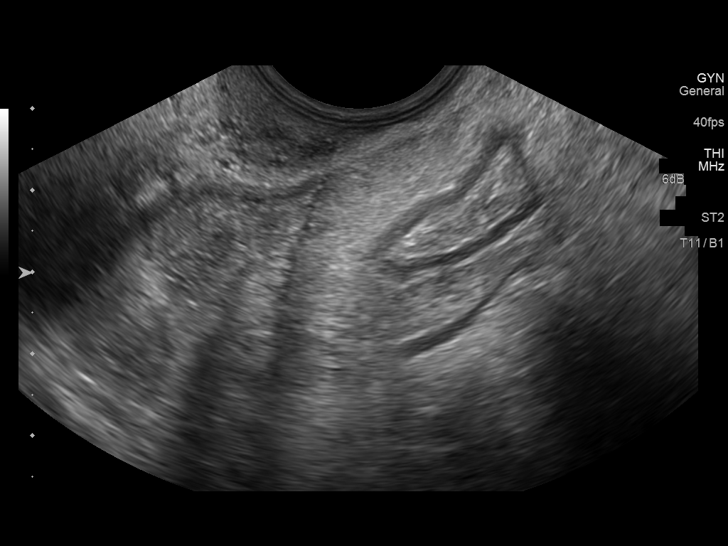
[im 74/81]
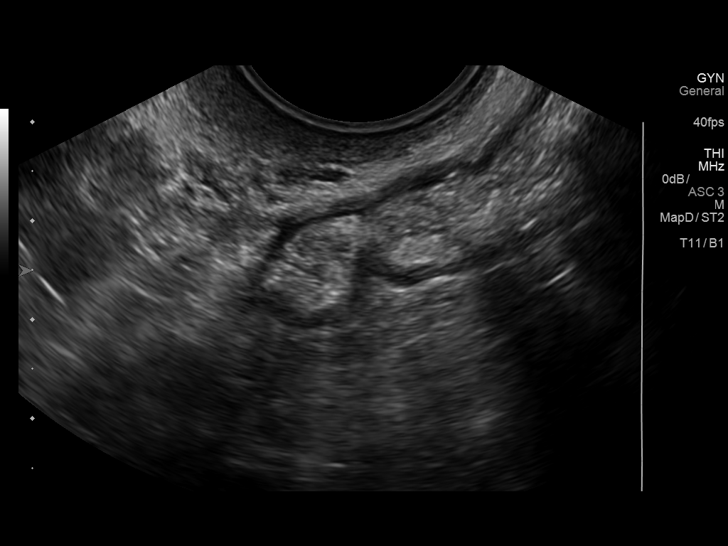
[im 81/81]
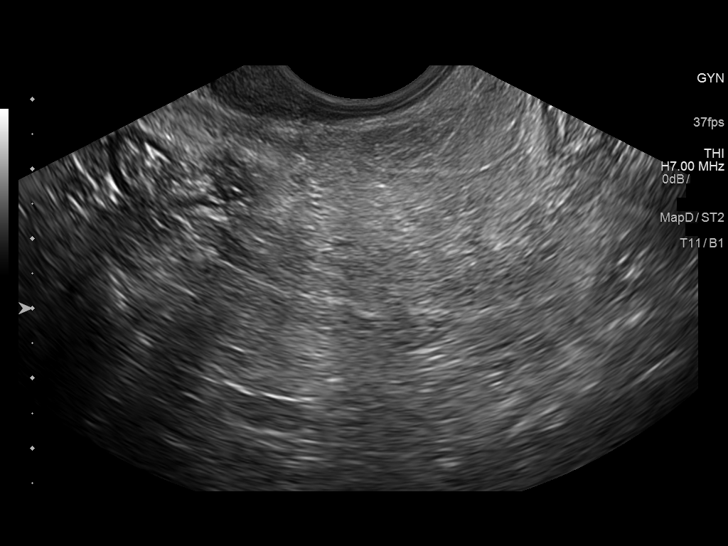

[14 of 25 positions shown; findings below may reference images not displayed]

FINDINGS: Uterus

Measurements: 4.0 x 1.5 x 2.3 cm. No fibroids or other mass
visualized.

Endometrium

Thickness: 2.8 mm.  Fluid is present within the endometrial canal.

Right ovary

Not visualized

Left ovary

Not visualized

Other findings

No abnormal free fluid.
IMPRESSION: 1. No adnexal mass identified.
2. Atrophic uterus with fluid in the endometrial canal. No
obstructing lesion is present. Recommend GYN consultation.

## 2018-04-22 ENCOUNTER — Ambulatory Visit: Payer: Medicare Other | Admitting: Pulmonary Disease

## 2018-04-22 NOTE — Progress Notes (Deleted)
Subjective:    Patient ID: Kerri Carter, female    DOB: Aug 08, 1940, 78 y.o.   MRN: [id number]  Synopsis: Ms. Owensby established care for COPD at the [facility] office in 08/2011.  Had PFTs in 04/2011 in March 2013 which showed: Ratio 70%, clear obstruction on flow volume loop, FEV1 1.79 L (81% predicted.), Total lung capacity 5.3 L (115% predicted), residual volume 2.49 L (136% predicted), DLCO 84% predicted. She smoked 0.5packs per day for 20 years.    HPI  No chief complaint on file.  ***   Past Medical History:  Diagnosis Date  . Anxiety   . Asthma   . Bipolar disorder ([place])   . Cataract    s/p b/l repair   . Chicken pox   . CKD (chronic kidney disease)   . CKD (chronic kidney disease), stage III ([place])    a. s/p R nephrectomy.  . Conversion disorder   . COPD (chronic obstructive pulmonary disease) ([place])   . Depression   . Essential hypertension   . GERD (gastroesophageal reflux disease)   . Hyperlipidemia   . Inflammatory arthritis    a. hands/carpal tunnel.  b. Low titer rheumatoid factor. c. Negative anti-CCP antibodies. d. Plaquenil.  . Non-Obstructive CAD    a. 07/2009 Cath ([facility]): nonobs dzs;  b. 03/2011 Cath [facility]): nonobs dzs.  . Osteoarthritis    a. Knees.  . PUD (peptic ulcer disease)   . S/P right hip fracture    11/01/16 s/p repair  . Sleep apnea    no cpap  . Spinal stenosis at L4-L5 level    severe with L4/L5 anterolisthesis grade 1 anterolisthesis   . Toxic maculopathy   . Valvular heart disease    a. 07/2015 Echo: EF 55-60%, Mild AI, AS, MR, and TR.     Review of Systems  Constitutional: Negative for activity change, chills and fatigue.  Respiratory: Positive for cough, shortness of breath and wheezing. Negative for stridor.   Cardiovascular: Negative for chest pain, palpitations and leg swelling.       Objective:   Physical Exam  There were no vitals filed for this visit.RA  ***   Records from her November 2018 visit with our nurse  practitioner reviewed where she was seen for and treated for a COPD exacerbation with prednisone and Augmentin.  CBC    Component Value Date/Time   WBC 11.1 (H) 04/14/2018 1050   RBC 4.47 04/14/2018 1050   HGB 12.4 04/14/2018 1050   HGB 11.6 (L) 11/24/2013 1622   HCT 39.7 04/14/2018 1050   HCT 37.0 11/24/2013 1622   PLT 228 04/14/2018 1050   PLT 188 11/24/2013 1622   MCV 88.8 04/14/2018 1050   MCV 85 11/24/2013 1622   MCH 27.7 04/14/2018 1050   MCHC 31.2 04/14/2018 1050   RDW 15.5 04/14/2018 1050   RDW 14.5 11/24/2013 1622   LYMPHSABS 2.1 01/21/2018 0837   LYMPHSABS 1.4 07/22/2013 0549   MONOABS 0.8 01/21/2018 0837   MONOABS 0.8 07/22/2013 0549   EOSABS 0.6 01/21/2018 0837   EOSABS 0.3 07/22/2013 0549   BASOSABS 0.1 01/21/2018 0837   BASOSABS 0.0 07/22/2013 0549        Assessment & Plan:   No diagnosis found.  Discussion: ***    Current Outpatient Medications:  .  albuterol (VENTOLIN HFA) 108 (90 Base) MCG/ACT inhaler, Inhale 2 puffs into the lungs every 6 (six) hours as needed. (Patient taking differently: Inhale 2 puffs into the lungs every 6 (  six) hours as needed for wheezing or shortness of breath. ), Disp: 1 Inhaler, Rfl: 5 .  ALPRAZolam (XANAX) 0.25 MG tablet, Take 1 tablet (0.25 mg total) by mouth at bedtime as needed for anxiety. (Patient taking differently: Take 0.25 mg by mouth at bedtime. ), Disp: 30 tablet, Rfl: 2 .  amoxicillin-clavulanate (AUGMENTIN) 875-125 MG tablet, Take 1 tablet by mouth 2 (two) times daily. With food (Patient not taking: Reported on 04/13/2018), Disp: 14 tablet, Rfl: 0 .  benzonatate (TESSALON) 100 MG capsule, Take 1 capsule (100 mg total) by mouth 3 (three) times daily as needed for cough., Disp: 30 capsule, Rfl: 0 .  clotrimazole (LOTRIMIN) 1 % cream, Apply 1 application topically 2 (two) times daily as needed (for irritation)., Disp: 60 g, Rfl: 11 .  escitalopram (LEXAPRO) 10 MG tablet, Take 1 tablet (10 mg total) by mouth daily.,  Disp: 90 tablet, Rfl: 3 .  famotidine (PEPCID) 20 MG tablet, Take 1 tablet (20 mg total) by mouth daily before breakfast., Disp: 90 tablet, Rfl: 3 .  fluticasone furoate-vilanterol (BREO ELLIPTA) 200-25 MCG/INH AEPB, Inhale 1 puff into the lungs daily. Rinse mouth, Disp: 1 each, Rfl: 12 .  gabapentin (NEURONTIN) 300 MG capsule, Take 2 capsules (600 mg total) by mouth 2 (two) times daily., Disp: 60 capsule, Rfl: 2 .  HYDROcodone-homatropine (HYCODAN) 5-1.5 MG/5ML syrup, Take 5 mLs by mouth at bedtime as needed for cough., Disp: 120 mL, Rfl: 0 .  ipratropium-albuterol (DUONEB) 0.5-2.5 (3) MG/3ML SOLN, Take 3 mLs by nebulization every 8 (eight) hours as needed., Disp: 360 mL, Rfl: 12 .  lamoTRIgine (LAMICTAL) 100 MG tablet, Take 1 tablet (100 mg total) by mouth 2 (two) times daily., Disp: 60 tablet, Rfl: 2 .  leflunomide (ARAVA) 20 MG tablet, Take 1 tablet (20 mg total) by mouth daily., Disp: , Rfl:  .  lovastatin (MEVACOR) 40 MG tablet, Take 1 tablet (40 mg total) by mouth at bedtime., Disp: 90 tablet, Rfl: 3 .  Melatonin 10 MG TABS, Take 10 mg by mouth at bedtime., Disp: 30 tablet, Rfl: 3 .  mirabegron ER (MYRBETRIQ) 50 MG TB24 tablet, Take 1 tablet (50 mg total) by mouth daily. (Patient taking differently: Take 50 mg by mouth at bedtime. ), Disp: 30 tablet, Rfl: 5 .  montelukast (SINGULAIR) 10 MG tablet, Take 1 tablet (10 mg total) by mouth daily., Disp: 90 tablet, Rfl: 3 .  multivitamin-lutein (OCUVITE-LUTEIN) CAPS capsule, Take 1 capsule by mouth 2 (two) times daily., Disp: , Rfl:  .  mupirocin ointment (BACTROBAN) 2 %, Apply 1 application topically 2 (two) times daily. X 1 week (Patient taking differently: Apply 1 application topically 2 (two) times daily as needed (tears). ), Disp: 30 g, Rfl: 0 .  nebivolol (BYSTOLIC) 10 MG tablet, Take 1 tablet (10 mg total) by mouth daily. (Patient taking differently: Take 5 mg by mouth daily. ), Disp: 30 tablet, Rfl: 5 .  ondansetron (ZOFRAN) 4 MG tablet, Take  1 tablet (4 mg total) by mouth every 8 (eight) hours as needed for nausea or vomiting., Disp: 30 tablet, Rfl: 0 .  oxyCODONE (OXY IR/ROXICODONE) 5 MG immediate release tablet, Take 1-2 tablets (5-10 mg total) by mouth every 4 (four) hours as needed for breakthrough pain., Disp: 60 tablet, Rfl: 0 .  pantoprazole (PROTONIX) 40 MG tablet, Take 1 tablet (40 mg total) by mouth at bedtime. 30 minutes before food. Note reduction in frequency, Disp: 90 tablet, Rfl: 3 .  predniSONE (DELTASONE) 20 MG tablet,  Take 1 tablet (20 mg total) by mouth daily with breakfast. (Patient not taking: Reported on 04/13/2018), Disp: 10 tablet, Rfl: 0 .  QUEtiapine (SEROQUEL) 25 MG tablet, Take 1 tablet (25 mg total) by mouth at bedtime., Disp: 30 tablet, Rfl: 2 .  sucralfate (CARAFATE) 1 g tablet, TAKE 1 TABLET BY MOUTH 4 TIMES A DAY WITH MEALS AND AT BEDTIME (Patient taking differently: Take 1 g by mouth 2 (two) times daily. ), Disp: 120 tablet, Rfl: 2 .  Teriparatide, Recombinant, (FORTEO) 600 MCG/2.4ML SOLN, Inject 0.08 mLs (20 mcg total) into the skin daily., Disp: 2.24 mL, Rfl: 0 .  Tiotropium Bromide Monohydrate (SPIRIVA RESPIMAT) 2.5 MCG/ACT AERS, Inhale 2 puffs into the lungs daily., Disp: 1 Inhaler, Rfl: 12 .  traMADol (ULTRAM) 50 MG tablet, Take 1 tablet (50 mg total) by mouth every 6 (six) hours as needed. (Patient taking differently: Take 50 mg by mouth every 6 (six) hours as needed for moderate pain. ), Disp: 40 tablet, Rfl: 0

## 2018-04-25 ENCOUNTER — Ambulatory Visit: Payer: Medicare Other

## 2018-04-26 ENCOUNTER — Ambulatory Visit
Admission: RE | Admit: 2018-04-26 | Discharge: 2018-04-26 | Disposition: A | Discharge status: Home / Self Care | Payer: Medicare Other | Source: Ambulatory Visit | Attending: Internal Medicine | Admitting: Internal Medicine

## 2018-04-26 ENCOUNTER — Other Ambulatory Visit: Payer: Self-pay

## 2018-04-26 DIAGNOSIS — R928 Other abnormal and inconclusive findings on diagnostic imaging of breast: Secondary | ICD-10-CM

## 2018-04-26 DIAGNOSIS — N6489 Other specified disorders of breast: Secondary | ICD-10-CM

## 2018-04-28 ENCOUNTER — Inpatient Hospital Stay: Admission: RE | Admit: 2018-04-28 | Payer: Medicare Other | Source: Home / Self Care | Admitting: Surgery

## 2018-04-28 ENCOUNTER — Encounter: Admission: RE | Payer: Self-pay | Source: Home / Self Care

## 2018-04-28 SURGERY — ARTHROPLASTY, SHOULDER, TOTAL, REVERSE
Anesthesia: Choice | Laterality: Left

## 2018-04-29 ENCOUNTER — Telehealth: Payer: Self-pay | Admitting: Internal Medicine

## 2018-04-29 ENCOUNTER — Ambulatory Visit (INDEPENDENT_AMBULATORY_CARE_PROVIDER_SITE_OTHER): Payer: Medicare Other | Admitting: Internal Medicine

## 2018-04-29 ENCOUNTER — Telehealth: Payer: Self-pay

## 2018-04-29 DIAGNOSIS — R058 Other specified cough: Secondary | ICD-10-CM

## 2018-04-29 DIAGNOSIS — R05 Cough: Secondary | ICD-10-CM | POA: Diagnosis not present

## 2018-04-29 DIAGNOSIS — K219 Gastro-esophageal reflux disease without esophagitis: Secondary | ICD-10-CM | POA: Diagnosis not present

## 2018-04-29 DIAGNOSIS — M87112 Osteonecrosis due to drugs, left shoulder: Secondary | ICD-10-CM | POA: Diagnosis not present

## 2018-04-29 DIAGNOSIS — J440 Chronic obstructive pulmonary disease with acute lower respiratory infection: Secondary | ICD-10-CM | POA: Diagnosis not present

## 2018-04-29 DIAGNOSIS — J209 Acute bronchitis, unspecified: Secondary | ICD-10-CM

## 2018-04-29 DIAGNOSIS — T380X5A Adverse effect of glucocorticoids and synthetic analogues, initial encounter: Secondary | ICD-10-CM

## 2018-04-29 DIAGNOSIS — R059 Cough, unspecified: Secondary | ICD-10-CM

## 2018-04-29 MED ORDER — PANTOPRAZOLE SODIUM 40 MG PO TBEC: 40.0000 mg | DELAYED_RELEASE_TABLET | Freq: Two times a day (BID) | ORAL | 0 refills | Status: DC

## 2018-04-29 MED ORDER — HYDROCODONE-HOMATROPINE 5-1.5 MG/5ML PO SYRP: 5.0000 mL | ORAL_SOLUTION | Freq: Every evening | ORAL | 0 refills | Status: DC | PRN

## 2018-04-29 MED ORDER — BENZONATATE 200 MG PO CAPS: 200.0000 mg | ORAL_CAPSULE | Freq: Three times a day (TID) | ORAL | 1 refills | Status: DC | PRN

## 2018-04-29 NOTE — Telephone Encounter (Signed)
Pt is scheduled for a telephone visit at 10:30am.

## 2018-04-29 NOTE — Telephone Encounter (Signed)
Pt called in to schedule appt w/ her PCP for ongoing cough.  Pt stated that this cough started in January and she had OV to see PCP but cough still has not cleared up.  Pt say that it has gotten worse.  Pt said that she coughs up tan colored mucus.  Pt said that she has shortness of breath w/ cough.  No fever.  Pt put on Dr. Lupita Dawn schedule today @ 10:30 am for a phone visit since PCP is out of the office.  Best call back number for visit is [phone].

## 2018-04-29 NOTE — Progress Notes (Signed)
Telephone Visit   I  connected with@ on 05/01/18 at 10:30 AM EDT smartphone  and verified that I am speaking with the correct person using two identifiers.  Location patient: home Location provider:work  Persons participating in the virtual visit: patient, provider  I discussed the limitations of evaluation and management by telemedicine and the availability of in person appointments. The patient expressed understanding and agreed to proceed.   HPI: Persistent cough that has been present since December 2019.    HPI:  78 yr old with COPD managed by Pulmonary with [facility] . Saw PCP  [facility] Jan 23 and treated for COPD exacerbation with  Tessalon Perles for daytime cough and codeine cough syrup for nighttime use.  Spiriva added.  NO significant change  Was reevaluated Feb 21 for persistent non productive cough  with chest x ray (no infiltrates) ; given augmentin and a 10  day prednisone 20 mg daily dose.   Cough still present,  And worse at night can't sleep.  Did not keep appt with Pulmonary on March 20  Has AVN of left shoulder  And a reverse left total  shoulder arthroplasty  Was scheduled for [facility] and postponed due to th COVID pandemic. ([facility]).  Patient is and worried it will be postponed again if she can't resolve her cough.  Has RA:  Managed with Jefm Bryant with [facility]   Describes the cough as Dry cough most of the time, , sleeps only if she uses the cough syrup and melatonin.    PMH Pertinent:   PATIENT has COPD;  last chest CT Nov 2016 no interstitial disease.     GERD  Managed with pepcid in the AM and protonix in the PM. EGD MARCH 2018:  EOE rule out    OSA with nocturnal hypoxemia managed by pulmonary with supplemental 02; OSA not treated due to insurance issues. .    Has nonischemic valvular heart disease with moderate AR (ECHO JAN 2020).        ROS: See pertinent positives and negatives per HPI.  Past Medical History:  Diagnosis Date  . Anxiety   . Asthma   . Bipolar  disorder ([place])   . Cataract    s/p b/l repair   . Chicken pox   . CKD (chronic kidney disease)   . CKD (chronic kidney disease), stage III ([place])    a. s/p R nephrectomy.  . Conversion disorder   . COPD (chronic obstructive pulmonary disease) ([place])   . Depression   . Essential hypertension   . GERD (gastroesophageal reflux disease)   . Hyperlipidemia   . Inflammatory arthritis    a. hands/carpal tunnel.  b. Low titer rheumatoid factor. c. Negative anti-CCP antibodies. d. Plaquenil.  . Non-Obstructive CAD    a. 07/2009 Cath ([facility]): nonobs dzs;  b. 03/2011 Cath [facility]): nonobs dzs.  . Osteoarthritis    a. Knees.  . PUD (peptic ulcer disease)   . S/P right hip fracture    11/01/16 s/p repair  . Sleep apnea    no cpap  . Spinal stenosis at L4-L5 level    severe with L4/L5 anterolisthesis grade 1 anterolisthesis   . Toxic maculopathy   . Valvular heart disease    a. 07/2015 Echo: EF 55-60%, Mild AI, AS, MR, and TR.    Past Surgical History:  Procedure Laterality Date  . APPENDECTOMY    . BACK SURGERY    . BUNIONECTOMY Right   . CATARACT EXTRACTION, BILATERAL    .  CESAREAN SECTION     x1  . CHOLECYSTECTOMY N/A 05/11/2016   Procedure: LAPAROSCOPIC CHOLECYSTECTOMY;  Surgeon: Florene Glen, MD;  Location: [facility] ORS;  Service: General;  Laterality: N/A;  . COLONOSCOPY WITH PROPOFOL N/A 04/02/2016   Procedure: COLONOSCOPY WITH PROPOFOL;  Surgeon: Jonathon Bellows, MD;  Location: [facility] ENDOSCOPY;  Service: Endoscopy;  Laterality: N/A;  . ENDOSCOPIC RETROGRADE CHOLANGIOPANCREATOGRAPHY (ERCP) WITH PROPOFOL N/A 05/08/2016   Procedure: ENDOSCOPIC RETROGRADE CHOLANGIOPANCREATOGRAPHY (ERCP) WITH PROPOFOL;  Surgeon: Lucilla Lame, MD;  Location: [facility] ENDOSCOPY;  Service: Endoscopy;  Laterality: N/A;  . ERCP     with biliary spincterotomy 05/08/16 Dr. Allen Norris for choledocholithiasis   . ESOPHAGEAL DILATION  04/02/2016   Procedure: ESOPHAGEAL DILATION;  Surgeon: Jonathon Bellows, MD;  Location: [facility] ENDOSCOPY;  Service:  Endoscopy;;  . ESOPHAGOGASTRODUODENOSCOPY (EGD) WITH PROPOFOL N/A 04/02/2016   Procedure: ESOPHAGOGASTRODUODENOSCOPY (EGD) WITH PROPOFOL;  Surgeon: Jonathon Bellows, MD;  Location: [facility] ENDOSCOPY;  Service: Endoscopy;  Laterality: N/A;  . HIP ARTHROPLASTY Right 11/01/2016   Procedure: ARTHROPLASTY BIPOLAR HIP (HEMIARTHROPLASTY);  Surgeon: Corky Mull, MD;  Location: [facility] ORS;  Service: Orthopedics;  Laterality: Right;  . 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 osteoporosis     noted DEXA 08/19/16   . REPLACEMENT TOTAL KNEE Right   . REVERSE SHOULDER ARTHROPLASTY Right 11/04/2017   Procedure: REVERSE SHOULDER ARTHROPLASTY;  Surgeon: Corky Mull, MD;  Location: [facility] ORS;  Service: Orthopedics;  Laterality: Right;  . TONSILLECTOMY    . TOTAL HIP ARTHROPLASTY  12/10/11   [facility] left hip  . TOTAL HIP ARTHROPLASTY Bilateral   . TUBAL LIGATION      Family History  Problem Relation Age of Onset  . Rheum arthritis Mother   . Asthma Mother   . Parkinson's disease Mother   . Heart disease Mother   . Stroke Mother   . Hypertension Mother   . Heart attack Father   . Heart disease Father   . Hypertension Father   . Diabetes Son   . Gout Son   . Asthma Sister   . Heart disease Sister   . Lung cancer Sister   . Heart disease Sister   . Heart disease Sister   . Breast cancer Sister   . Heart attack Sister   . Heart disease Brother   . Heart disease Maternal Grandmother   . Diabetes Maternal Grandmother   . Colon cancer Maternal Grandmother   . Cancer Maternal Grandmother        Hodgkins lymphoma  . Heart disease Brother   . Alcohol abuse Brother   . Depression Brother   . Dementia Son     SOCIAL HX: unchanged   Current Outpatient Medications:  .  albuterol (VENTOLIN HFA) 108 (90 Base) MCG/ACT inhaler, Inhale 2 puffs into the lungs every 6 (six) hours as needed. (Patient taking differently: Inhale 2 puffs into the lungs every 6 (six) hours as  needed for wheezing or shortness of breath. ), Disp: 1 Inhaler, Rfl: 5 .  ALPRAZolam (XANAX) 0.25 MG tablet, Take 1 tablet (0.25 mg total) by mouth at bedtime as needed for anxiety. (Patient taking differently: Take 0.25 mg by mouth at bedtime. ), Disp: 30 tablet, Rfl: 2 .  clotrimazole (LOTRIMIN) 1 % cream, Apply 1 application topically 2 (two) times daily as needed (for irritation)., Disp: 60 g, Rfl: 11 .  escitalopram (LEXAPRO) 10 MG tablet, Take 1 tablet (10 mg total) by mouth  daily., Disp: 90 tablet, Rfl: 3 .  fluticasone furoate-vilanterol (BREO ELLIPTA) 200-25 MCG/INH AEPB, Inhale 1 puff into the lungs daily. Rinse mouth, Disp: 1 each, Rfl: 12 .  gabapentin (NEURONTIN) 300 MG capsule, Take 2 capsules (600 mg total) by mouth 2 (two) times daily., Disp: 60 capsule, Rfl: 2 .  HYDROcodone-homatropine (HYCODAN) 5-1.5 MG/5ML syrup, Take 5 mLs by mouth at bedtime as needed for cough., Disp: 150 mL, Rfl: 0 .  ipratropium-albuterol (DUONEB) 0.5-2.5 (3) MG/3ML SOLN, Take 3 mLs by nebulization every 8 (eight) hours as needed., Disp: 360 mL, Rfl: 12 .  lamoTRIgine (LAMICTAL) 100 MG tablet, Take 1 tablet (100 mg total) by mouth 2 (two) times daily., Disp: 60 tablet, Rfl: 2 .  leflunomide (ARAVA) 20 MG tablet, Take 1 tablet (20 mg total) by mouth daily., Disp: , Rfl:  .  lovastatin (MEVACOR) 40 MG tablet, Take 1 tablet (40 mg total) by mouth at bedtime., Disp: 90 tablet, Rfl: 3 .  Melatonin 10 MG TABS, Take 10 mg by mouth at bedtime., Disp: 30 tablet, Rfl: 3 .  mirabegron ER (MYRBETRIQ) 50 MG TB24 tablet, Take 1 tablet (50 mg total) by mouth daily. (Patient taking differently: Take 50 mg by mouth at bedtime. ), Disp: 30 tablet, Rfl: 5 .  montelukast (SINGULAIR) 10 MG tablet, Take 1 tablet (10 mg total) by mouth daily., Disp: 90 tablet, Rfl: 3 .  multivitamin-lutein (OCUVITE-LUTEIN) CAPS capsule, Take 1 capsule by mouth 2 (two) times daily., Disp: , Rfl:  .  mupirocin ointment (BACTROBAN) 2 %, Apply 1  application topically 2 (two) times daily. X 1 week (Patient taking differently: Apply 1 application topically 2 (two) times daily as needed (tears). ), Disp: 30 g, Rfl: 0 .  nebivolol (BYSTOLIC) 10 MG tablet, Take 1 tablet (10 mg total) by mouth daily. (Patient taking differently: Take 5 mg by mouth daily. ), Disp: 30 tablet, Rfl: 5 .  ondansetron (ZOFRAN) 4 MG tablet, Take 1 tablet (4 mg total) by mouth every 8 (eight) hours as needed for nausea or vomiting., Disp: 30 tablet, Rfl: 0 .  oxyCODONE (OXY IR/ROXICODONE) 5 MG immediate release tablet, Take 1-2 tablets (5-10 mg total) by mouth every 4 (four) hours as needed for breakthrough pain., Disp: 60 tablet, Rfl: 0 .  pantoprazole (PROTONIX) 40 MG tablet, Take 1 tablet (40 mg total) by mouth 2 (two) times daily. 30 minutes before food. Note reduction in frequency, Disp: 180 tablet, Rfl: 0 .  QUEtiapine (SEROQUEL) 25 MG tablet, Take 1 tablet (25 mg total) by mouth at bedtime., Disp: 30 tablet, Rfl: 2 .  sucralfate (CARAFATE) 1 g tablet, TAKE 1 TABLET BY MOUTH 4 TIMES A DAY WITH MEALS AND AT BEDTIME (Patient taking differently: Take 1 g by mouth 2 (two) times daily. ), Disp: 120 tablet, Rfl: 2 .  Teriparatide, Recombinant, (FORTEO) 600 MCG/2.4ML SOLN, Inject 0.08 mLs (20 mcg total) into the skin daily., Disp: 2.24 mL, Rfl: 0 .  Tiotropium Bromide Monohydrate (SPIRIVA RESPIMAT) 2.5 MCG/ACT AERS, Inhale 2 puffs into the lungs daily., Disp: 1 Inhaler, Rfl: 12 .  traMADol (ULTRAM) 50 MG tablet, Take 1 tablet (50 mg total) by mouth every 6 (six) hours as needed. (Patient taking differently: Take 50 mg by mouth every 6 (six) hours as needed for moderate pain. ), Disp: 40 tablet, Rfl: 0 .  benzonatate (TESSALON) 200 MG capsule, Take 1 capsule (200 mg total) by mouth 3 (three) times daily as needed for cough., Disp: 60 capsule, Rfl: 1  EXAM:  VITALS per patient if applicable:  GENERAL: alert, oriented, not short of breath   NECK: normal movements of the  head and neck  LUNGS: not  Hoarse  or short of breath,  Did not cough during entire 25 minute phone conversation   CV: no obvious cyanosis  MS: moves all visible extremities without noticeable abnormality  PSYCH/NEURO: pleasant and cooperative, no obvious depression or anxiety, speech and thought processing grossly intact  ASSESSMENT AND PLAN:  Discussed the following assessment and plan:  Cough present for greater than 3 weeks - Plan: CT Chest Wo Contrast  Acute bronchitis with COPD ([place]) - Plan: HYDROcodone-homatropine (HYCODAN) 5-1.5 MG/5ML syrup  Gastroesophageal reflux disease, esophagitis presence not specified - Plan: pantoprazole (PROTONIX) 40 MG tablet  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 The patient was provided an opportunity to ask questions and all were answered. The patient agreed with the plan and demonstrated an understanding of the instructions.   The patient was advised to call back or seek an in-person evaluation if the symptoms worsen or if the condition fails to improve as anticipated.  I provided 25  minutes of non-face-to-face time during this encounter.   Kerri Mc, MD

## 2018-05-01 NOTE — Assessment & Plan Note (Addendum)
reverse total left shoulder arthroplasty has been cancelled by [facility] due to the [place] pandemic . Reassured patient that her cough will not delay her surgery given the absence of infectious symptoms.

## 2018-05-01 NOTE — Assessment & Plan Note (Signed)
Present for >3 months.  Etiology likely under treatment of COPD vs pulmonary fibrosis,  Given normal chest x ray Feb 2020 and failure to improve with abx and 10 days of steroid. She has GERD but EOE ruled out by EGD biopsy  March 2018 and she is taking a nightly PPI and morning H2 blocker.  Will increase use of PP to  Bid and refill cough suppressants.  Limit steroid use (not refilled); CT chest ordered (last one 2016)

## 2018-05-04 ENCOUNTER — Telehealth: Payer: Self-pay | Admitting: Internal Medicine

## 2018-05-04 NOTE — Telephone Encounter (Signed)
Pt states she still has the cough since around Christmas. Please advise? Pt is still coughing.    Call pt @ [phone]. Thank you!

## 2018-05-04 NOTE — Telephone Encounter (Signed)
Note was sent to Dr Derrel Nip who seen patient last for sx.

## 2018-05-09 ENCOUNTER — Ambulatory Visit (INDEPENDENT_AMBULATORY_CARE_PROVIDER_SITE_OTHER): Payer: Medicare Other | Admitting: Psychiatry

## 2018-05-09 ENCOUNTER — Other Ambulatory Visit: Payer: Self-pay

## 2018-05-09 ENCOUNTER — Encounter: Payer: Self-pay | Admitting: Psychiatry

## 2018-05-09 ENCOUNTER — Ambulatory Visit: Payer: Medicare Other

## 2018-05-09 DIAGNOSIS — F411 Generalized anxiety disorder: Secondary | ICD-10-CM

## 2018-05-09 DIAGNOSIS — F39 Unspecified mood [affective] disorder: Secondary | ICD-10-CM

## 2018-05-09 DIAGNOSIS — F32A Depression, unspecified: Secondary | ICD-10-CM

## 2018-05-09 DIAGNOSIS — F419 Anxiety disorder, unspecified: Secondary | ICD-10-CM

## 2018-05-09 DIAGNOSIS — F329 Major depressive disorder, single episode, unspecified: Secondary | ICD-10-CM

## 2018-05-09 MED ORDER — ALPRAZOLAM 0.25 MG PO TABS: 0.2500 mg | ORAL_TABLET | Freq: Every evening | ORAL | 2 refills | Status: DC | PRN

## 2018-05-09 MED ORDER — LAMOTRIGINE 100 MG PO TABS: 100.0000 mg | ORAL_TABLET | Freq: Two times a day (BID) | ORAL | 2 refills | Status: DC

## 2018-05-09 MED ORDER — QUETIAPINE FUMARATE 25 MG PO TABS: 25.0000 mg | ORAL_TABLET | Freq: Every day | ORAL | 2 refills | Status: DC

## 2018-05-09 MED ORDER — ESCITALOPRAM OXALATE 10 MG PO TABS: 10.0000 mg | ORAL_TABLET | Freq: Every day | ORAL | 3 refills | Status: DC

## 2018-05-09 NOTE — Progress Notes (Cosign Needed)
Tc on  05-09-18 @ 2:27 pt medical and surgical hx was reviewed with updated. pt allergies were reviewed with no changes. Pt medications and pharmacy were reviewed and updated. Vital were not done due to this was a phone consult.

## 2018-05-09 NOTE — Addendum Note (Signed)
Addended by: Carlynn Purl on: 05/09/2018 02:27 PM   Modules accepted: Orders

## 2018-05-09 NOTE — Progress Notes (Signed)
Patient is a 78 year old female with history of mood disorder and anxiety who was followed for her medication refill. She reported that she has been doing well since she was started on Xanax. She has noticed significant improvement in her sleep and anxiety. She is taking combination of Seroquel and Xanax at bedtime. She currently lives with her husband and her daughter. She's trying to stay indoors due to the Covid pandemic. Patient reported that she does not go out unless she has doctor's appointment. She has been compliant with her medications. No other acute symptoms noted at this time.  Plan.  Will refill her medications. Call in Xanax perception to the local pharmacy. Follow up in three months.  I have discussed the assessment and treatment plan with the patient. The patient was provided an opportunity to ask questions and all were answered. The patient agreed with the plan and demonstrated an understanding of the instructions.   The patient was advised to call back or seek an in-person evaluation if the symptoms worsen or if the condition fails to improve as anticipated.   I provided 10 minutes of non-face-to-face time during this encounter.

## 2018-05-16 ENCOUNTER — Telehealth: Payer: Self-pay | Admitting: Gastroenterology

## 2018-05-16 NOTE — Telephone Encounter (Signed)
Patient called needing a refill on  sucralfate (CARAFATE) 1 g tablet  Taking 06/21/17 -- Jonathon Bellows, MD    TAKE 1 TABLET BY MOUTH 4 TIMES A DAY WITH MEALS AND AT BEDTIME   Patient taking differently: Take 1 g by mouth 2 (two) times daily.    .She has spoken with the pharmacy & they state they have sent in 2 request. She uses [facility] in [place].

## 2018-05-17 ENCOUNTER — Other Ambulatory Visit: Payer: Self-pay

## 2018-05-17 DIAGNOSIS — R1031 Right lower quadrant pain: Secondary | ICD-10-CM

## 2018-05-17 MED ORDER — SUCRALFATE 1 G PO TABS: ORAL_TABLET | ORAL | 0 refills | Status: DC

## 2018-05-17 NOTE — Telephone Encounter (Signed)
She can get a refill 1

## 2018-05-18 NOTE — Telephone Encounter (Signed)
Spoke with pt and informed her that we have refilled her prescription I explained that she'll need to schedule a follow up with Dr. Vicente Males if she wishes to continue refilling the sucralfate. Pt agrees to contact our office to schedule once the COVID-19 has resolved.

## 2018-05-24 IMAGING — US US ABDOMEN LIMITED
1 series · 14 of 25 positions shown · non-contrast
Comparison: CT 02/25/2016

CLINICAL DATA: Right upper quadrant pain and vomiting. Onset 11
hours ago.

EXAM:
US ABDOMEN LIMITED - RIGHT UPPER QUADRANT

[Series 1: us abdomen limited · 0.22mm/px · 14 of 54 slices shown]
[im 1/54]
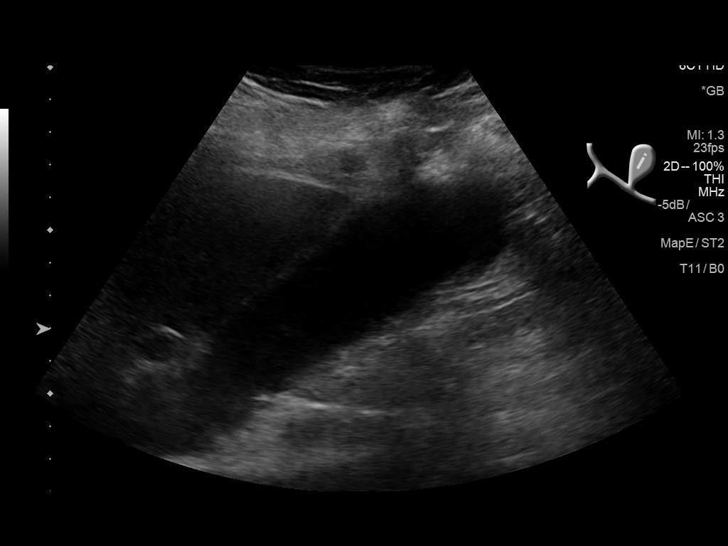
[im 5/54]
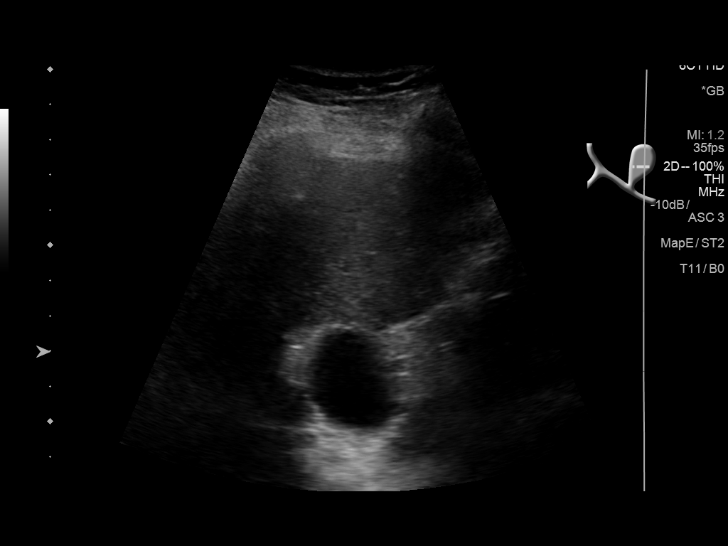
[im 9/54]
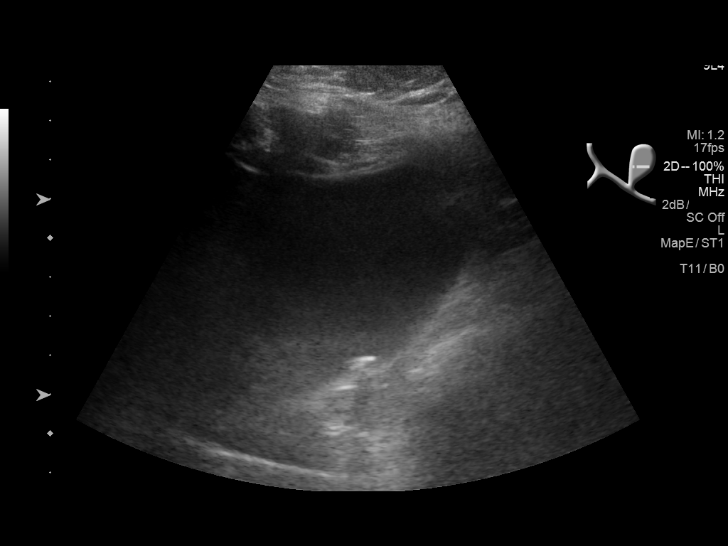
[im 14/54]
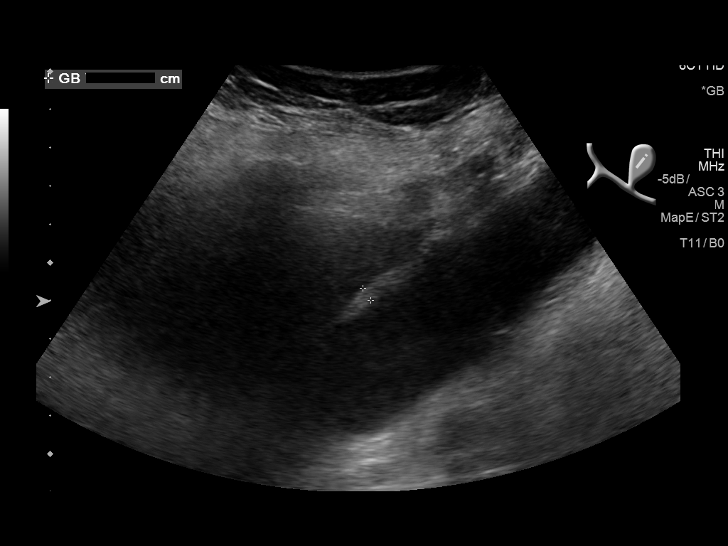
[im 18/54]
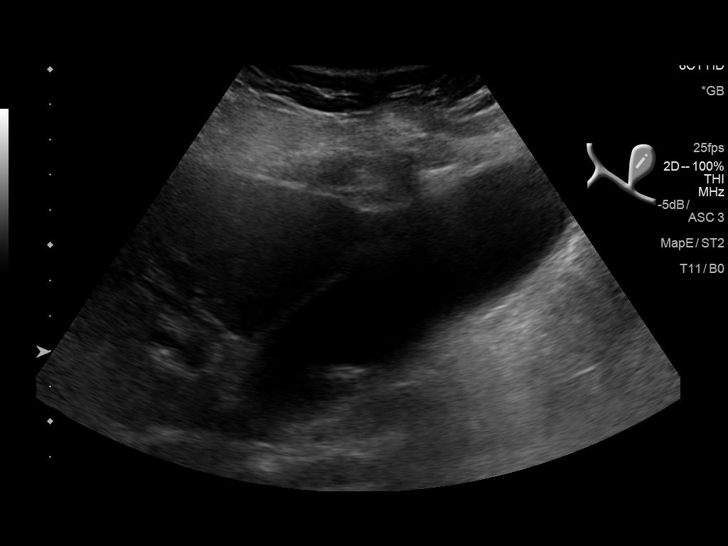
[im 20/54]
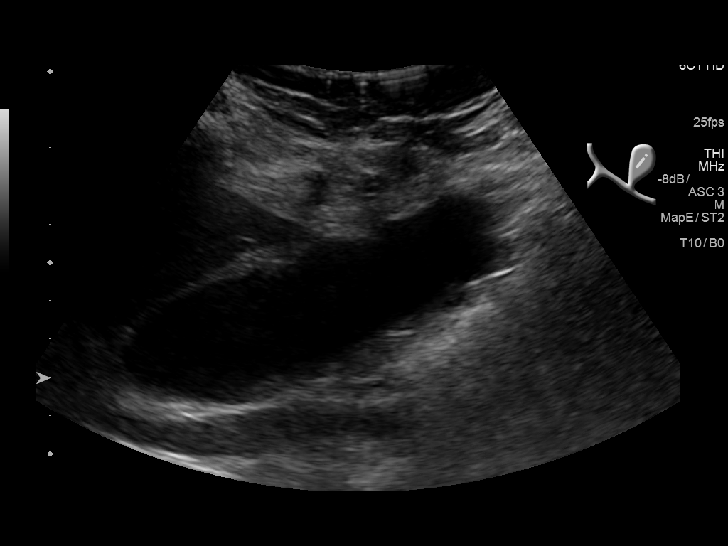
[im 25/54]
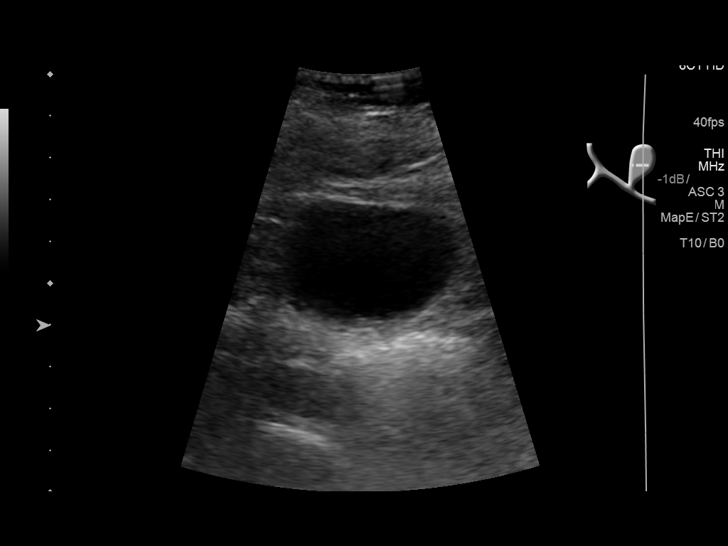
[im 29/54]
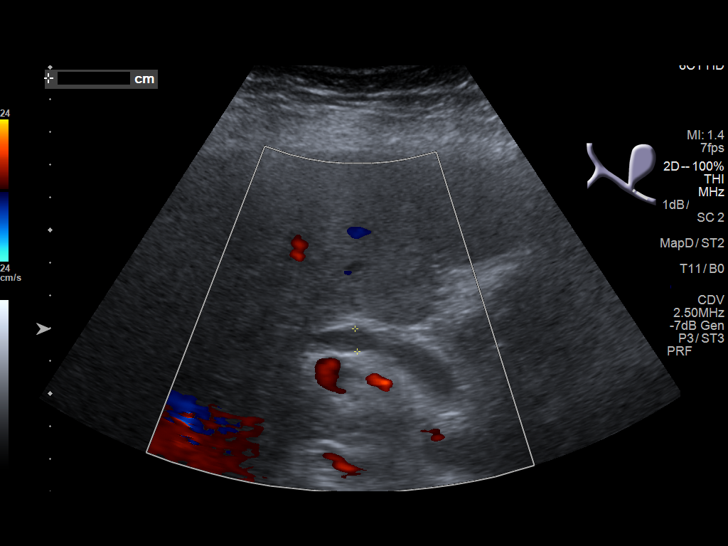
[im 34/54]
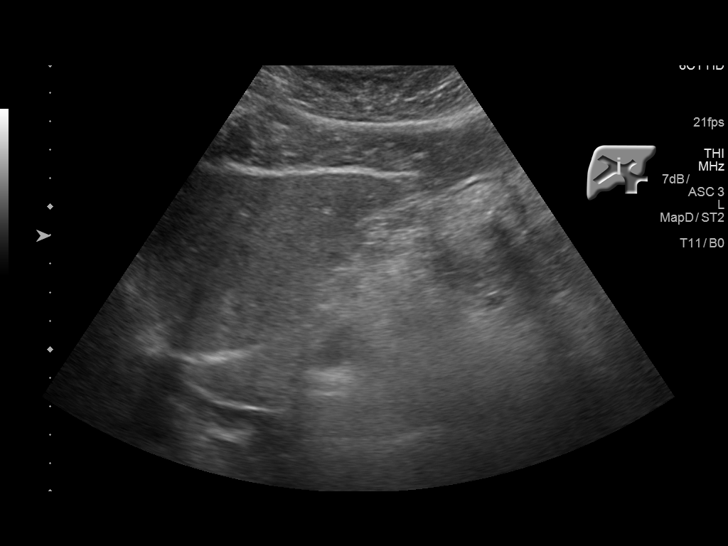
[im 36/54]
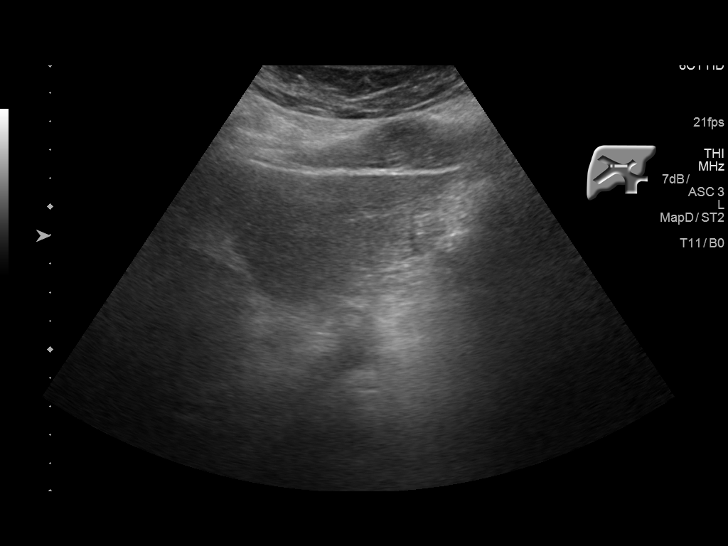
[im 40/54]
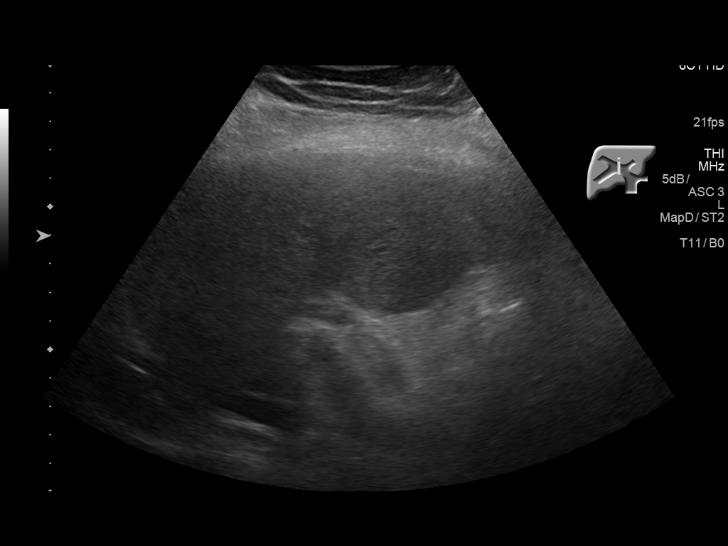
[im 45/54]
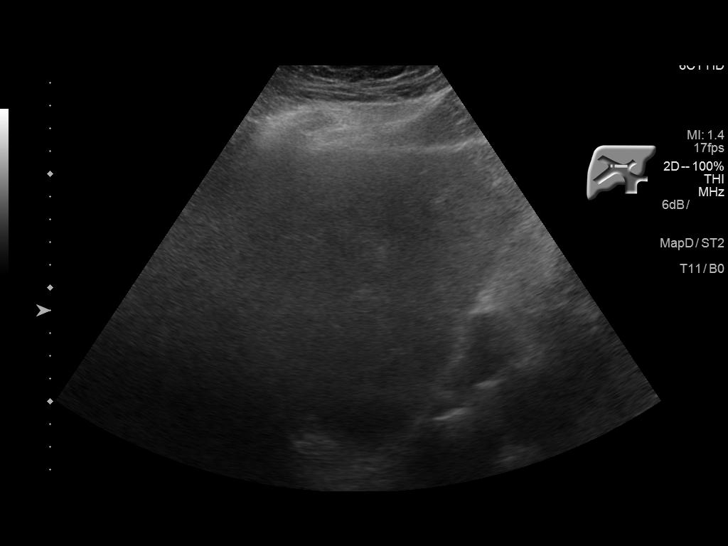
[im 49/54]
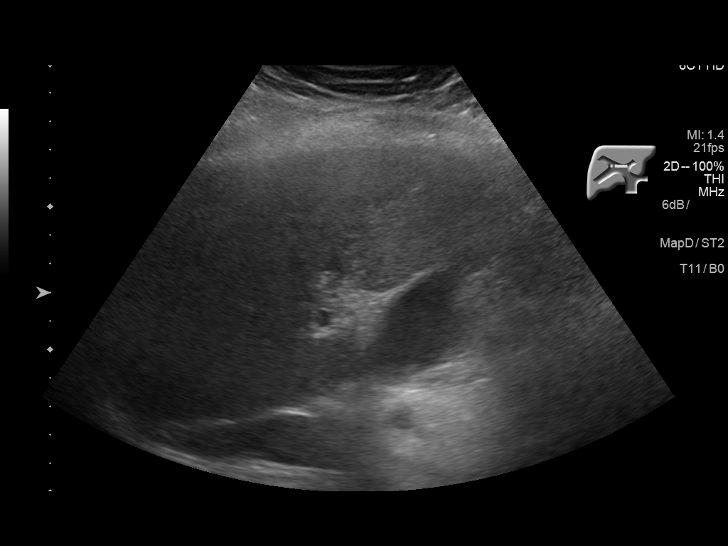
[im 54/54]
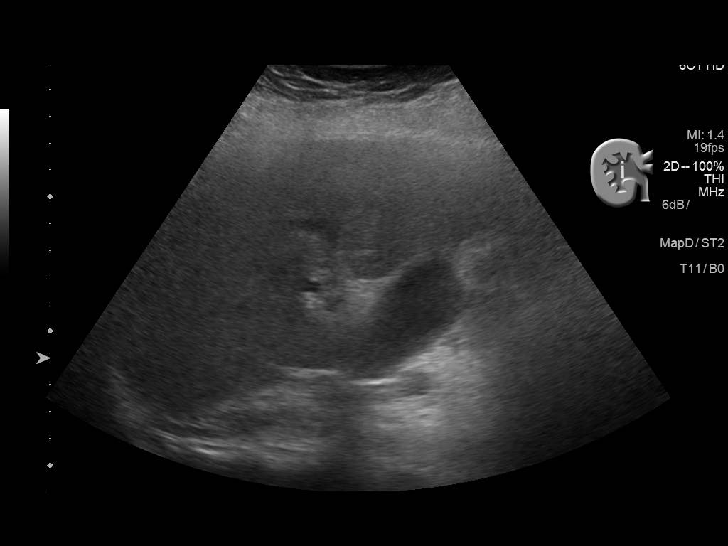

[14 of 25 positions shown; findings below may reference images not displayed]

FINDINGS: Gallbladder:

Multiple calculi are present, measuring up to 11 mm. Borderline
gallbladder wall thickness, 3.8 mm. No pericholecystic fluid. No
tenderness over the gallbladder during the exam, but the patient had
been medicated.

Common bile duct:

Diameter: 7 mm.

Liver:

Mild coarsening of hepatic parenchymal echotexture without focal
lesion.
IMPRESSION: There are mild study limitations due to bowel gas. There is
cholelithiasis and borderline gallbladder wall thickness. Mildly
coarsened hepatic parenchymal echotexture may represent fatty
infiltration, but is nonspecific.

## 2018-05-25 ENCOUNTER — Ambulatory Visit: Payer: Medicare Other | Admitting: Internal Medicine

## 2018-05-30 ENCOUNTER — Other Ambulatory Visit: Payer: Self-pay | Admitting: Psychiatry

## 2018-06-02 ENCOUNTER — Ambulatory Visit: Payer: Medicare Other | Attending: Internal Medicine

## 2018-06-06 ENCOUNTER — Ambulatory Visit: Payer: Medicare Other

## 2018-06-10 ENCOUNTER — Other Ambulatory Visit: Payer: Self-pay | Admitting: Gastroenterology

## 2018-06-10 DIAGNOSIS — R1031 Right lower quadrant pain: Secondary | ICD-10-CM

## 2018-06-14 ENCOUNTER — Other Ambulatory Visit: Payer: Self-pay | Admitting: Internal Medicine

## 2018-06-14 DIAGNOSIS — J309 Allergic rhinitis, unspecified: Secondary | ICD-10-CM

## 2018-06-14 MED ORDER — MONTELUKAST SODIUM 10 MG PO TABS: 10.0000 mg | ORAL_TABLET | Freq: Every day | ORAL | 3 refills | Status: DC

## 2018-07-04 ENCOUNTER — Other Ambulatory Visit: Payer: Self-pay | Admitting: Internal Medicine

## 2018-07-04 ENCOUNTER — Telehealth: Payer: Self-pay | Admitting: *Deleted

## 2018-07-04 DIAGNOSIS — R600 Localized edema: Secondary | ICD-10-CM

## 2018-07-04 MED ORDER — FUROSEMIDE 20 MG PO TABS: 20.0000 mg | ORAL_TABLET | Freq: Every day | ORAL | 5 refills | Status: DC | PRN

## 2018-07-04 NOTE — Telephone Encounter (Signed)
Copied from [place] [id number]. Topic: General - Inquiry >> Jul 04, 2018  9:22 AM Virl Axe D wrote: Reason for CRM: Broadus John, RN with [facility] called to report on pt. She is in their [facility] program. Pt has had a weight increase of 4-5 lbs in the last week, increased swelling in feet, no fever and SOB at times. Please advise. [phone]

## 2018-07-04 NOTE — Telephone Encounter (Signed)
[place]  [address]  [place], [place] [address]  [phone]  [place], [place], [place]  [address] [place], [place] [address]  [phone]  [phone] (Fax)     She needs to call cardiology above and let them know about weight increase 4-5  Lbs and feet swelling  I ordered lasix 20 mg daily as needed but I want her to take it currently daily x 5 days and call back with how she is doing  After 5 days take daily as needed   [place]

## 2018-07-05 NOTE — Telephone Encounter (Signed)
Patient has been informed.

## 2018-07-07 ENCOUNTER — Ambulatory Visit (INDEPENDENT_AMBULATORY_CARE_PROVIDER_SITE_OTHER): Payer: Medicare Other | Admitting: Internal Medicine

## 2018-07-07 ENCOUNTER — Other Ambulatory Visit: Payer: Self-pay

## 2018-07-07 DIAGNOSIS — R05 Cough: Secondary | ICD-10-CM | POA: Diagnosis not present

## 2018-07-07 DIAGNOSIS — J984 Other disorders of lung: Secondary | ICD-10-CM | POA: Diagnosis not present

## 2018-07-07 DIAGNOSIS — J849 Interstitial pulmonary disease, unspecified: Secondary | ICD-10-CM | POA: Insufficient documentation

## 2018-07-07 DIAGNOSIS — R059 Cough, unspecified: Secondary | ICD-10-CM

## 2018-07-07 DIAGNOSIS — J441 Chronic obstructive pulmonary disease with (acute) exacerbation: Secondary | ICD-10-CM | POA: Diagnosis not present

## 2018-07-07 DIAGNOSIS — R5383 Other fatigue: Secondary | ICD-10-CM | POA: Diagnosis not present

## 2018-07-07 DIAGNOSIS — I38 Endocarditis, valve unspecified: Secondary | ICD-10-CM

## 2018-07-07 DIAGNOSIS — M7989 Other specified soft tissue disorders: Secondary | ICD-10-CM

## 2018-07-07 HISTORY — DX: Other fatigue: R53.83

## 2018-07-07 NOTE — Progress Notes (Addendum)
Telephone Note  I connected with Kerri Carter   on 07/07/18 at  1:34 PM EDT by a telephone and verified that I am speaking with the correct person using two identifiers.  Location patient: home Location provider:work  Persons participating in the virtual visit: patient, provider  I discussed the limitations of evaluation and management by telemedicine and the availability of in person appointments. The patient expressed understanding and agreed to proceed.   HPI: 1. COPD/small airway disease she states cough last night was worse and she coughed all night with lack of sleep she tried a neb tx and tessalon perles bid and Tussionex bid. She reports she has done 5 days of cough drops and Tussionex prn.  She coughs so much she is sob. Cough is dry to productive with clear sputum though this is not new for her. She is taking singulair, Spiriva, Breo, duoneb and as needed albuterol.  She is worried cough will keep her from having shoulder surgery upcoming 07/26/18 with Dr. Roland Rack  She denies GERD is worse and does not think it is cause of cough she is on protonix 40 bid   -disc for chronic cough tx would be lyrica or gabapentin which she is already on max dose is 900 mg bid gabapentin but she reports cost of lyrica in the past was too much   2. C/o fatigue  3. Chronic leg edema R>L swelling improved since lasix 20 mg prn resumed will let her cardiologist know she resumed prn lasix  She cancelled echo prev ordered after discussion with cardiology as felt was not needed at the time.  ROS: See pertinent positives and negatives per HPI.  Past Medical History:  Diagnosis Date  . Anxiety   . Asthma   . Bipolar disorder ([place])   . Cataract    s/p b/l repair   . Chicken pox   . CKD (chronic kidney disease)   . CKD (chronic kidney disease), stage III ([place])    a. s/p R nephrectomy.  . Conversion disorder   . COPD (chronic obstructive pulmonary disease) ([place])   . Depression   . Essential hypertension    . GERD (gastroesophageal reflux disease)   . Hyperlipidemia   . Inflammatory arthritis    a. hands/carpal tunnel.  b. Low titer rheumatoid factor. c. Negative anti-CCP antibodies. d. Plaquenil.  . Non-Obstructive CAD    a. 07/2009 Cath ([facility]): nonobs dzs;  b. 03/2011 Cath [facility]): nonobs dzs.  . Osteoarthritis    a. Knees.  . PUD (peptic ulcer disease)   . S/P right hip fracture    11/01/16 s/p repair  . Sleep apnea    no cpap  . Spinal stenosis at L4-L5 level    severe with L4/L5 anterolisthesis grade 1 anterolisthesis   . Toxic maculopathy   . Valvular heart disease    a. 07/2015 Echo: EF 55-60%, Mild AI, AS, MR, and TR.    Past Surgical History:  Procedure Laterality Date  . APPENDECTOMY    . BACK SURGERY    . BUNIONECTOMY Right   . CATARACT EXTRACTION, BILATERAL    . CESAREAN SECTION     x1  . CHOLECYSTECTOMY N/A 05/11/2016   Procedure: LAPAROSCOPIC CHOLECYSTECTOMY;  Surgeon: Florene Glen, MD;  Location: [facility] ORS;  Service: General;  Laterality: N/A;  . COLONOSCOPY WITH PROPOFOL N/A 04/02/2016   Procedure: COLONOSCOPY WITH PROPOFOL;  Surgeon: Jonathon Bellows, MD;  Location: [facility] ENDOSCOPY;  Service: Endoscopy;  Laterality: N/A;  . ENDOSCOPIC RETROGRADE CHOLANGIOPANCREATOGRAPHY (  ERCP) WITH PROPOFOL N/A 05/08/2016   Procedure: ENDOSCOPIC RETROGRADE CHOLANGIOPANCREATOGRAPHY (ERCP) WITH PROPOFOL;  Surgeon: Lucilla Lame, MD;  Location: [facility] ENDOSCOPY;  Service: Endoscopy;  Laterality: N/A;  . ERCP     with biliary spincterotomy 05/08/16 Dr. Allen Norris for choledocholithiasis   . ESOPHAGEAL DILATION  04/02/2016   Procedure: ESOPHAGEAL DILATION;  Surgeon: Jonathon Bellows, MD;  Location: [facility] ENDOSCOPY;  Service: Endoscopy;;  . ESOPHAGOGASTRODUODENOSCOPY (EGD) WITH PROPOFOL N/A 04/02/2016   Procedure: ESOPHAGOGASTRODUODENOSCOPY (EGD) WITH PROPOFOL;  Surgeon: Jonathon Bellows, MD;  Location: [facility] ENDOSCOPY;  Service: Endoscopy;  Laterality: N/A;  . HIP ARTHROPLASTY Right 11/01/2016   Procedure: ARTHROPLASTY BIPOLAR  HIP (HEMIARTHROPLASTY);  Surgeon: Corky Mull, MD;  Location: [facility] ORS;  Service: Orthopedics;  Laterality: Right;  . 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 osteoporosis     noted DEXA 08/19/16   . REPLACEMENT TOTAL KNEE Right   . REVERSE SHOULDER ARTHROPLASTY Right 11/04/2017   Procedure: REVERSE SHOULDER ARTHROPLASTY;  Surgeon: Corky Mull, MD;  Location: [facility] ORS;  Service: Orthopedics;  Laterality: Right;  . TONSILLECTOMY    . TOTAL HIP ARTHROPLASTY  12/10/11   [facility] left hip  . TOTAL HIP ARTHROPLASTY Bilateral   . TUBAL LIGATION      Family History  Problem Relation Age of Onset  . Rheum arthritis Mother   . Asthma Mother   . Parkinson's disease Mother   . Heart disease Mother   . Stroke Mother   . Hypertension Mother   . Heart attack Father   . Heart disease Father   . Hypertension Father   . Diabetes Son   . Gout Son   . Asthma Sister   . Heart disease Sister   . Lung cancer Sister   . Heart disease Sister   . Heart disease Sister   . Breast cancer Sister   . Heart attack Sister   . Heart disease Brother   . Heart disease Maternal Grandmother   . Diabetes Maternal Grandmother   . Colon cancer Maternal Grandmother   . Cancer Maternal Grandmother        Hodgkins lymphoma  . Heart disease Brother   . Alcohol abuse Brother   . Depression Brother   . Dementia Son     SOCIAL HX: married    Current Outpatient Medications:  .  Adalimumab (HUMIRA PEN) 40 MG/0.4ML PNKT, Inject into the skin every 14 (fourteen) days., Disp: , Rfl:  .  albuterol (VENTOLIN HFA) 108 (90 Base) MCG/ACT inhaler, Inhale 2 puffs into the lungs every 6 (six) hours as needed. (Patient taking differently: Inhale 2 puffs into the lungs every 6 (six) hours as needed for wheezing or shortness of breath. ), Disp: 1 Inhaler, Rfl: 5 .  ALPRAZolam (XANAX) 0.25 MG tablet, Take 1 tablet (0.25 mg total) by mouth at bedtime as needed for anxiety., Disp: 30  tablet, Rfl: 2 .  clotrimazole (LOTRIMIN) 1 % cream, Apply 1 application topically 2 (two) times daily as needed (for irritation)., Disp: 60 g, Rfl: 11 .  escitalopram (LEXAPRO) 10 MG tablet, Take 1 tablet (10 mg total) by mouth daily., Disp: 90 tablet, Rfl: 3 .  fluticasone furoate-vilanterol (BREO ELLIPTA) 200-25 MCG/INH AEPB, Inhale 1 puff into the lungs daily. Rinse mouth, Disp: 1 each, Rfl: 12 .  furosemide (LASIX) 20 MG tablet, Take 1 tablet (20 mg total) by mouth daily as needed. Take x  5 days with weight gain and  leg swelling then daily prn, Disp: 30 tablet, Rfl: 5 .  gabapentin (NEURONTIN) 300 MG capsule, Take 2 capsules (600 mg total) by mouth 2 (two) times daily., Disp: 60 capsule, Rfl: 2 .  ipratropium-albuterol (DUONEB) 0.5-2.5 (3) MG/3ML SOLN, Take 3 mLs by nebulization every 8 (eight) hours as needed., Disp: 360 mL, Rfl: 12 .  lamoTRIgine (LAMICTAL) 100 MG tablet, Take 1 tablet (100 mg total) by mouth 2 (two) times daily., Disp: 180 tablet, Rfl: 2 .  leflunomide (ARAVA) 20 MG tablet, Take 1 tablet (20 mg total) by mouth daily., Disp: , Rfl:  .  lovastatin (MEVACOR) 40 MG tablet, Take 1 tablet (40 mg total) by mouth at bedtime., Disp: 90 tablet, Rfl: 3 .  Melatonin 10 MG TABS, Take 10 mg by mouth at bedtime., Disp: 30 tablet, Rfl: 3 .  mirabegron ER (MYRBETRIQ) 50 MG TB24 tablet, Take 1 tablet (50 mg total) by mouth daily. (Patient taking differently: Take 50 mg by mouth at bedtime. ), Disp: 30 tablet, Rfl: 5 .  montelukast (SINGULAIR) 10 MG tablet, Take 1 tablet (10 mg total) by mouth daily., Disp: 90 tablet, Rfl: 3 .  multivitamin-lutein (OCUVITE-LUTEIN) CAPS capsule, Take 1 capsule by mouth 2 (two) times daily., Disp: , Rfl:  .  mupirocin ointment (BACTROBAN) 2 %, Apply 1 application topically 2 (two) times daily. X 1 week (Patient taking differently: Apply 1 application topically 2 (two) times daily as needed (tears). ), Disp: 30 g, Rfl: 0 .  nebivolol (BYSTOLIC) 10 MG tablet, Take  1 tablet (10 mg total) by mouth daily. (Patient taking differently: Take 5 mg by mouth daily. ), Disp: 30 tablet, Rfl: 5 .  ondansetron (ZOFRAN) 4 MG tablet, Take 1 tablet (4 mg total) by mouth every 8 (eight) hours as needed for nausea or vomiting., Disp: 30 tablet, Rfl: 0 .  oxyCODONE (OXY IR/ROXICODONE) 5 MG immediate release tablet, Take 1-2 tablets (5-10 mg total) by mouth every 4 (four) hours as needed for breakthrough pain., Disp: 60 tablet, Rfl: 0 .  pantoprazole (PROTONIX) 40 MG tablet, Take 1 tablet (40 mg total) by mouth 2 (two) times daily. 30 minutes before food. Note reduction in frequency, Disp: 180 tablet, Rfl: 0 .  QUEtiapine (SEROQUEL) 25 MG tablet, Take 1 tablet (25 mg total) by mouth at bedtime., Disp: 90 tablet, Rfl: 2 .  sucralfate (CARAFATE) 1 g tablet, TAKE 1 TABLET BY MOUTH 4 TIMES A DAY WITH MEALS AND AT BEDTIME, Disp: 120 tablet, Rfl: 0 .  Teriparatide, Recombinant, (FORTEO) 600 MCG/2.4ML SOLN, Inject 0.08 mLs (20 mcg total) into the skin daily., Disp: 2.24 mL, Rfl: 0 .  Tiotropium Bromide Monohydrate (SPIRIVA RESPIMAT) 2.5 MCG/ACT AERS, Inhale 2 puffs into the lungs daily., Disp: 1 Inhaler, Rfl: 12 .  traMADol (ULTRAM) 50 MG tablet, Take 1 tablet (50 mg total) by mouth every 6 (six) hours as needed. (Patient taking differently: Take 50 mg by mouth every 6 (six) hours as needed for moderate pain. ), Disp: 40 tablet, Rfl: 0  EXAM:  VITALS per patient if applicable:per pt BP 175/10 weight 169 lbs   GENERAL: alert, oriented, appears well and in no acute distress  HEENT: atraumatic, conjunttiva clear, no obvious abnormalities on inspection of external nose and ears  NECK: normal movements of the head and neck  LUNGS: on inspection no signs of respiratory distress, breathing rate appears normal, no obvious gross SOB, gasping or wheezing  CV: no obvious cyanosis  MS: moves all visible extremities without noticeable  abnormality  PSYCH/NEURO: pleasant and cooperative, no  obvious depression or anxiety, speech and thought processing grossly intact  ASSESSMENT AND PLAN:  Discussed the following assessment and plan:  Chronic obstructive pulmonary disease with acute exacerbation (HCC) Cough, chronic  Small airways disease -cont breo 200/25, spiriva, prn neb and albuterol  -disc with pt increasing gabapentin 600 mg bid to 600 mg in am and 900 qhs off label tx for chronic cough  -will CC Dr. Lake Bells to see if he has any other ideas to manage chronic cough  -she is on a PPI protonix GERD controlled   Fatigue, unspecified type -will CC Dr. Roland Rack to see if he can check B12, TSh, and vitamin D, with upcoming pre op labs   Leg swelling improved with lasix 20 mg prn with worse swelling increase to 5 days daily then after 5 days daily prn  -she will reach out to her cardiologist about leg swelling but it has improved with adding back prn lasix 20  Valvular heart disease -of note she prev canceled echo per recs of cards [facility]   HM Had fluper pt had at pharmacy, prevnar, pna 23 utd allergic to Tdap declines shingrix   mammo  04/26/2018 repeat normal Colonoscopy 04/02/16 multiple polyps but tortuous colon GI does not want to do any further  Out of age window pap  DEXA 08/19/16 +osteoporosis on Forteo will need to repeat 1 year after Forteo use to see improvement rheumatology following. -DEXA 10/12/17 with T score -4.1 worse on forteounable to do any further bone densities per pt 07/07/2018 due to h/o multiple ortho surgeries   Seeing dermatology in [place] [facility] 08/2017  rheumaappt 01/17/18 left shoulder surgery sch 07/26/2018 (Dr. Roland Rack) s/p right shoulder surgery C/w RA precet for Humira f/u in 4-6 weeks Dr. Precious Reel   I discussed the assessment and treatment plan with the patient. The patient was provided an opportunity to ask questions and all were answered. The patient agreed with the plan and demonstrated an understanding of the  instructions.   The patient was advised to call back or seek an in-person evaluation if the symptoms worsen or if the condition fails to improve as anticipated.  Times spent 20 minutes  Delorise Jackson, MD

## 2018-07-11 ENCOUNTER — Telehealth: Payer: Self-pay | Admitting: Pulmonary Disease

## 2018-07-11 ENCOUNTER — Other Ambulatory Visit: Payer: Self-pay | Admitting: Pulmonary Disease

## 2018-07-11 MED ORDER — GABAPENTIN 300 MG PO CAPS: ORAL_CAPSULE | ORAL | 11 refills | Status: DC

## 2018-07-11 NOTE — Telephone Encounter (Signed)
Called and spoke with pt who stated she has had a dry cough off and on since December 2019. Pt stated that she did get rid of the cough in March 2020 but then about a month ago her cough came back. Pt stated due to the cough, she has SOB involved.  Pt is taking spiriva and breo as prescribed, has taken tessalon which has given her no relief, and has also been doing neb treatments twice daily.  Due to pt's numerous allergies, she will not take any meds OTC.  Pt is also taking singulair as prescribed.  Pt is scheduled for surgery in about 2 weeks and is hoping to be able to get help with the cough prior to her surgery as she does not want to have the cough when she has the surgery. Stated to pt that we needed to get her scheduled for a visit with provider to further evaluate and pt verbalized  Understanding. Pt has been scheduled for televisit with TP tomorrow, 6/9 at 12pm. Nothing further needed.

## 2018-07-11 NOTE — Patient Instructions (Signed)
Cough, Adult  Coughing is a reflex that clears your throat and your airways. Coughing helps to heal and protect your lungs. It is normal to cough occasionally, but a cough that happens with other symptoms or lasts a long time may be a sign of a condition that needs treatment. A cough may last only 2-3 weeks (acute), or it may last longer than 8 weeks (chronic). What are the causes? Coughing is commonly caused by:  Breathing in substances that irritate your lungs.  A viral or bacterial respiratory infection.  Allergies.  Asthma.  Postnasal drip.  Smoking.  Acid backing up from the stomach into the esophagus (gastroesophageal reflux).  Certain medicines.  Chronic lung problems, including COPD (or rarely, lung cancer).  Other medical conditions such as heart failure. Follow these instructions at home: Pay attention to any changes in your symptoms. Take these actions to help with your discomfort:  Take medicines only as told by your health care provider. ? If you were prescribed an antibiotic medicine, take it as told by your health care provider. Do not stop taking the antibiotic even if you start to feel better. ? Talk with your health care provider before you take a cough suppressant medicine.  Drink enough fluid to keep your urine clear or pale yellow.  If the air is dry, use a cold steam vaporizer or humidifier in your bedroom or your home to help loosen secretions.  Avoid anything that causes you to cough at work or at home.  If your cough is worse at night, try sleeping in a semi-upright position.  Avoid cigarette smoke. If you smoke, quit smoking. If you need help quitting, ask your health care provider.  Avoid caffeine.  Avoid alcohol.  Rest as needed. Contact a health care provider if:  You have new symptoms.  You cough up pus.  Your cough does not get better after 2-3 weeks, or your cough gets worse.  You cannot control your cough with suppressant  medicines and you are losing sleep.  You develop pain that is getting worse or pain that is not controlled with pain medicines.  You have a fever.  You have unexplained weight loss.  You have night sweats. Get help right away if:  You cough up blood.  You have difficulty breathing.  Your heartbeat is very fast. This information is not intended to replace advice given to you by your health care provider. Make sure you discuss any questions you have with your health care provider. Document Released: 07/18/2010 Document Revised: 06/27/2015 Document Reviewed: 03/28/2014 Elsevier Interactive Patient Education  2019 Elsevier Inc.  

## 2018-07-12 ENCOUNTER — Ambulatory Visit (INDEPENDENT_AMBULATORY_CARE_PROVIDER_SITE_OTHER): Payer: Medicare Other | Admitting: Adult Health

## 2018-07-12 ENCOUNTER — Telehealth: Payer: Self-pay | Admitting: Pulmonary Disease

## 2018-07-12 ENCOUNTER — Encounter: Payer: Self-pay | Admitting: Adult Health

## 2018-07-12 ENCOUNTER — Other Ambulatory Visit: Payer: Self-pay

## 2018-07-12 DIAGNOSIS — J441 Chronic obstructive pulmonary disease with (acute) exacerbation: Secondary | ICD-10-CM

## 2018-07-12 MED ORDER — PREDNISONE 10 MG PO TABS: ORAL_TABLET | ORAL | 0 refills | Status: DC

## 2018-07-12 MED ORDER — AMOXICILLIN-POT CLAVULANATE 875-125 MG PO TABS: 1.0000 | ORAL_TABLET | Freq: Two times a day (BID) | ORAL | 0 refills | Status: AC

## 2018-07-12 MED ORDER — BENZONATATE 200 MG PO CAPS: 200.0000 mg | ORAL_CAPSULE | Freq: Three times a day (TID) | ORAL | 2 refills | Status: DC | PRN

## 2018-07-12 NOTE — Patient Instructions (Addendum)
Augmentin 875mg  Twice daily  For 7 days, take w/ food.  Mucinex DM Twice daily  As needed  Cough/congestion -can try childs dose .  Prednisone taper over next week .  Tessalon Three times a day  As needed  Cough .  Continue on BREO and Spiriva daily , rinse after use  Follow up Dr. Lake Bells or Suhan Paci NP in 2 weeks and As needed   Please contact office for sooner follow up if symptoms do not improve or worsen or seek emergency care

## 2018-07-12 NOTE — Progress Notes (Signed)
Virtual Visit via Telephone Note  I connected with Kerri Carter on 07/12/18 at 12:00 PM EDT by telephone and verified that I am speaking with the correct person using two identifiers.  Location: Patient: Home  Provider: Office    I discussed the limitations, risks, security and privacy concerns of performing an evaluation and management service by telephone and the availability of in person appointments. I also discussed with the patient that there may be a patient responsible charge related to this service. The patient expressed understanding and agreed to proceed.   History of Present Illness: Today's televisit is for an acute office visit for COPD .   78 year old female former smoker followed for COPD/Asthma  RA on ARAVA  And Humira (MTX was stopped 02/2016 )   Patient complains of 3 weeks of cough , congestion and wheezing , Coughing up yellow mucus .  Cough is keeping her up at night . Used tessalon some .  She remains on BREO , Spiriva , and Singulair .  Covid test yesterday - self test . Results pending from PCP .   Scheduled for shoulder surgery in 2 weeks .  Chest xray 03/2018 lungs clear .       Observations/Objective:   04/2011 in March 2013 which showed: Ratio 70%, clear obstruction on flow volume loop, FEV1 1.79 L (81% predicted.), Total lung capacity 5.3 L (115% predicted), residual volume 2.49 L (136% predicted), DLCO 84% predicted PFT 11/2015 FEV1 94%, ratio 79, FVC 90%, DLCO 72% , ++BD response .   Assessment and Plan: COPD exacerbation -   Plan  Patient Instructions  Augmentin 875mg  Twice daily  For 7 days, take w/ food.  Mucinex DM Twice daily  As needed  Cough/congestion -can try childs dose .  Prednisone taper over next week .  Tessalon Three times a day  As needed  Cough .  Continue on BREO and Spiriva daily , rinse after use  Follow up Dr. [place] Bells or Ranveer Wahlstrom NP in 2 weeks and As needed   Please contact office for sooner follow up if symptoms do not  improve or worsen or seek emergency care    '      Follow Up Instructions: Follow up in 2 weeks and As needed       I discussed the assessment and treatment plan with the patient. The patient was provided an opportunity to ask questions and all were answered. The patient agreed with the plan and demonstrated an understanding of the instructions.   The patient was advised to call back or seek an in-person evaluation if the symptoms worsen or if the condition fails to improve as anticipated.  I provided  23 minutes of non-face-to-face time during this encounter.   Rexene Edison, NP

## 2018-07-12 NOTE — Telephone Encounter (Signed)
lmtcb

## 2018-07-12 NOTE — Progress Notes (Signed)
Reviewed, agree 

## 2018-07-13 ENCOUNTER — Telehealth: Payer: Self-pay | Admitting: Pulmonary Disease

## 2018-07-13 ENCOUNTER — Telehealth: Payer: Self-pay

## 2018-07-13 NOTE — Telephone Encounter (Signed)
Please see previous telephone note from 07/13/2018. Pt and I created her 2 week f/u appt for Friday 07/22/2018 at 1:30 PM. Pt states TP wanted her to have a f/u before her upcoming surgery, which will be on 07/26/2018. Appt has been made. Nothing further needed at this time.

## 2018-07-13 NOTE — Telephone Encounter (Signed)
Copied from [place] [id number]. Topic: General - Other >> Jul 13, 2018  9:17 AM [place] A wrote: Broadus John RN from [facility] wanted to inform Dr. Terese Door that patient has gained 4lbs in two days since taking diuretic.

## 2018-07-13 NOTE — Telephone Encounter (Signed)
Patient has been notified

## 2018-07-13 NOTE — Telephone Encounter (Signed)
Primary Pulmonologist: BQ Last office visit and with whom: 07/12/2018 What do we see them for (pulmonary problems): COPD  Reason for call: Pt states she coughed up some blood last night before she went to bed. She describes it starting w/ bright red streaks, then gradually mixed in with thick mucus. Pt states she's been having difficulties coughing up congestion and that this was the first time she was able to get anything up for awhile. Pt last seen yesterday 07/12/2018 by TP, who gave her antibiotics and prednisone taper & requested f/u appt in 2 weeks. This appt has been made at the time of call (f/u 07/22/2018 at 1:30 PM w/ TP). Pt is currently taking augmentin bid, pred taper,  tessalon perles tid, Breo & Spiriva as directed, and Duoneb PRN. Pt Denies fever/chills/sweats. Pt reports increased SOB. Denies wheezing, chest tightness/pain.  Pt suspects this may have started yesterday when she went outside in the humidity to pick up her medication and to go to her husband's doctor's appointment.   In the last month, have you been in contact with someone who was confirmed or suspected to have [place] / COVID-19?  No  Do you have any of the following symptoms developed in the last 30 days? Fever: No Cough: Yes, nonproductive w/ some blood mixed in thick mucus Shortness of breath: Yes, increased  When did your symptoms start?  Last night 07/12/2018 when she was going to bed  If the patient has a fever, what is the last reading?  (use n/a if patient denies fever)  N/A . IF THE PATIENT STATES THEY DO NOT OWN A THERMOMETER, THEY MUST GO AND PURCHASE ONE When did the fever start?: N/A Have you taken any medication to suppress a fever (ie Ibuprofen, Aleve, Tylenol)?: N/A  TP is currently out of office, therefore, I am routing this message to the provider of the day.   SG, please advise on your recommendations for this patient. Thank you.

## 2018-07-13 NOTE — Telephone Encounter (Signed)
Pt needs to notify her cardiologist  [place]

## 2018-07-13 NOTE — Telephone Encounter (Signed)
Patient had televisit with Tammy Parrett 07/12/18. There is a note from Family Medicine dated 07/13/18 advising the patient to contact cardiology. Nothing further needed at this time.

## 2018-07-13 NOTE — Telephone Encounter (Signed)
She is already on prednisone and antibiotic. Cough suppression is important at this point Have her add Delsym every 12 hours for cough Continue Tessalon three times daily Schedule her for a video visit tomorrow If she coughs up large amounts she needs to go to the ER and seek emergency care.

## 2018-07-13 NOTE — Telephone Encounter (Signed)
Called and talked with patient.  Relayed information from Eric Form, NP.  Patient verbalized understanding.  Scheduled patient for a MyChart video visit with Sarah tomorrow at 0930.  Patient is going to have her granddaughter help her with the video visit/show her the steps tonight.  Did let the patient know if for some reason she couldn't do the video visit that we could have it switched to telephone but that the video would be best.   Routing to Judson Roch as Juluis Rainier for tomorrow's visit.

## 2018-07-14 ENCOUNTER — Ambulatory Visit
Admission: RE | Admit: 2018-07-14 | Discharge: 2018-07-14 | Disposition: A | Discharge status: Home / Self Care | Payer: Medicare Other | Source: Ambulatory Visit | Attending: Acute Care | Admitting: Acute Care

## 2018-07-14 ENCOUNTER — Other Ambulatory Visit: Payer: Self-pay

## 2018-07-14 ENCOUNTER — Telehealth: Payer: Self-pay | Admitting: Acute Care

## 2018-07-14 ENCOUNTER — Encounter: Payer: Self-pay | Admitting: Acute Care

## 2018-07-14 ENCOUNTER — Telehealth (INDEPENDENT_AMBULATORY_CARE_PROVIDER_SITE_OTHER): Payer: Medicare Other | Admitting: Acute Care

## 2018-07-14 DIAGNOSIS — T17908A Unspecified foreign body in respiratory tract, part unspecified causing other injury, initial encounter: Secondary | ICD-10-CM

## 2018-07-14 NOTE — Patient Instructions (Addendum)
It is good to talk with you today. We will place an order for a CXR at [facility] for today  We will call with results. See if you can get the results of your COVID test today. We will change your appointment from 07/22/2018 to 07/21/2018 at 11:30 with Eric Form NP Continue taking Protonix as you have been doing Try sleeping in several pillows to elevate your head of bed to help with reflux. Tessalon Perles three times daily for cough Do not drive if sleepy. Delsym every 12 hours for cough. We will do a swallow evaluation at [place] before the surgery on 07/26/2018. Call us if you have any additional bloody secretions If you notice a lot of blood or develop shortness of breath please seek emergency care.  Please contact office for sooner follow up if symptoms do not improve or worsen or seek emergency care

## 2018-07-14 NOTE — Telephone Encounter (Signed)
Called and spoke with pt. Pt stated that [facility] radiology dept was able to find the order for the cxr and pt is currently back there now having the xray performed. Nothing further needed.

## 2018-07-14 NOTE — Progress Notes (Signed)
Virtual Visit via Telephone Note  I connected with Kerri Carter on 07/14/18 at  9:30 AM EDT by telephone and verified that I am speaking with the correct person using two identifiers.  Location: Patient: At Home Provider: At the office   I discussed the limitations, risks, security and privacy concerns of performing an evaluation and management service by telephone and the availability of in person appointments. I also discussed with the patient that there may be a patient responsible charge related to this service. The patient expressed understanding and agreed to proceed.  I  confirmed date of birth and address to authenticate  Identity. My nurse Quentin Ore reviewed medications and ordered any refills required.  78 year old female former smoker followed for COPD/Asthma  RA on ARAVA  And Humira (MTX was stopped 02/2016 )  She is a patient of Dr. Lake Bells  History of Present Illness: Pt. Called because she was worried when she coughed up some bright red blood 2 nights ago. She states she has not coughed up any further blood. She  noticed that she has had some discolored sputum the next day, but she is no longer coughing up sputum at all. .She states she has coughing spells when she eats and when she lays down to go to bed. She is on Protonix for reflux. The dose was decreased to once daily, but GI have increased dose to twice daily as they felt this may be contributing to her recent worsening of cough.She states she has been compliant with this increase in dose. She states she has not had fever, and has minimal secretions. She states she does sometimes get choked on her food.  She was started on Augmentin 875 BID x 7 days on Monday and she is on her prednisone taper. She had a COVID test on Monday Morning. She is going to call for the results prior to going to [place] for her CXR today. She is anxious to get this taken care of before 07/26/2018 when she has shoulder surgery.She denies fever,  chest pain, orthopnea or hemoptysis. She states her reflux symptoms have been particularly bad.    Observations/Objective: 04/2011 in March 2013 which showed: Ratio 70%, clear obstruction on flow volume loop, FEV1 1.79 L (81% predicted.), Total lung capacity 5.3 L (115% predicted), residual volume 2.49 L (136% predicted), DLCO 84% predicted PFT 11/2015 FEV1 94%, ratio 79, FVC 90%, DLCO 72% , ++BD response .  Assessment and Plan: Single episode of Hemoptysis 2 nights ago, has self resolved Worsening cough worse when eating and when lying down( ? Aspiration vs worsening reflux or both) Current  treatment for COPD exacerbation and tx with Augmentin and pred taper started  07/11/2018 Denies fever Plan: We will place an order for a CXR at [facility] for today  We will call with results. See if you can get the results of your COVID test today. Call us and let us know the results We will change your appointment from 07/22/2018 to 07/21/2018 at 11:30 with Eric Form NP Continue taking Protonix as you have been doing Follow up with GI at [place] Try sleeping on several pillows to elevate your head of bed to help with reflux. Tessalon Perles three times daily for cough Do not drive if sleepy. Delsym every 12 hours for cough. Continue Breo and DuoNebs as you have been doing We will do a swallow evaluation at [place] before the surgery on 07/26/2018. Call us if you have any additional bloody secretions If you  notice a lot of blood or develop shortness of breath please seek emergency care immediately.  Please contact office for sooner follow up if symptoms do not improve or worsen or seek emergency care   Follow Up Instructions: Follow up OV with Laree Garron NP 07/21/2018 at 11:30   I discussed the assessment and treatment plan with the patient. The patient was provided an opportunity to ask questions and all were answered. The patient agreed with the plan and demonstrated an understanding of the  instructions.   The patient was advised to call back or seek an in-person evaluation if the symptoms worsen or if the condition fails to improve as anticipated.  I provided 22 minutes of non-face-to-face time during this encounter.   Magdalen Spatz, NP 07/14/2018 10:19 AM

## 2018-07-16 NOTE — Progress Notes (Signed)
Reviewed, agree 

## 2018-07-21 ENCOUNTER — Ambulatory Visit: Payer: Medicare Other | Admitting: Acute Care

## 2018-07-22 ENCOUNTER — Other Ambulatory Visit: Payer: Self-pay

## 2018-07-22 ENCOUNTER — Encounter
Admission: RE | Admit: 2018-07-22 | Discharge: 2018-07-22 | Disposition: A | Discharge status: Home / Self Care | Payer: Medicare Other | Source: Ambulatory Visit | Attending: Surgery | Admitting: Surgery

## 2018-07-22 ENCOUNTER — Ambulatory Visit: Payer: Medicare Other | Admitting: Adult Health

## 2018-07-22 DIAGNOSIS — Z01812 Encounter for preprocedural laboratory examination: Secondary | ICD-10-CM | POA: Diagnosis present

## 2018-07-22 DIAGNOSIS — Z1159 Encounter for screening for other viral diseases: Secondary | ICD-10-CM | POA: Insufficient documentation

## 2018-07-22 HISTORY — DX: Pain in unspecified shoulder: M25.519

## 2018-07-22 LAB — SURGICAL PCR SCREEN
MRSA, PCR: NEGATIVE
Staphylococcus aureus: POSITIVE — AB

## 2018-07-22 NOTE — Patient Instructions (Signed)
Your procedure is scheduled on: 07/26/2018 Tues Report to Same Day Surgery 2nd floor medical mall [facility] Entrance-take elevator on left to 2nd floor.  Check in with surgery information desk.) To find out your arrival time please call [phone] between 1PM - 3PM on 07/25/2018 Mon  Remember: Instructions that are not followed completely may result in serious medical risk, up to and including death, or upon the discretion of your surgeon and anesthesiologist your surgery may need to be rescheduled.    _x___ 1. Do not eat food after midnight the night before your procedure. You may drink clear liquids up to 2 hours before you are scheduled to arrive at the hospital for your procedure.  Do not drink clear liquids within 2 hours of your scheduled arrival to the hospital.  Clear liquids include  --Water or Apple juice without pulp  --Clear carbohydrate beverage such as ClearFast or Gatorade  --Black Coffee or Clear Tea (No milk, no creamers, do not add anything to                  the coffee or Tea Type 1 and type 2 diabetics should only drink water.   ____Ensure clear carbohydrate drink on the way to the hospital for bariatric patients  ____Ensure clear carbohydrate drink 3 hours before surgery for Dr Dwyane Luo patients if physician instructed.   No gum chewing or hard candies.     __x__ 2. No Alcohol for 24 hours before or after surgery.   __x__3. No Smoking or e-cigarettes for 24 prior to surgery.  Do not use any chewable tobacco products for at least 6 hour prior to surgery   ____  4. Bring all medications with you on the day of surgery if instructed.    __x__ 5. Notify your doctor if there is any change in your medical condition     (cold, fever, infections).    x___6. On the morning of surgery brush your teeth with toothpaste and water.  You may rinse your mouth with mouth wash if you wish.  Do not swallow any toothpaste or mouthwash.   Do not wear jewelry, make-up,  hairpins, clips or nail polish.  Do not wear lotions, powders, or perfumes. You may wear deodorant.  Do not shave 48 hours prior to surgery. Men may shave face and neck.  Do not bring valuables to the hospital.    [facility] is not responsible for any belongings or valuables.               Contacts, dentures or bridgework may not be worn into surgery.  Leave your suitcase in the car. After surgery it may be brought to your room.  For patients admitted to the hospital, discharge time is determined by your                       treatment team.  _  Patients discharged the day of surgery will not be allowed to drive home.  You will need someone to drive you home and stay with you the night of your procedure.    Please read over the following fact sheets that you were given:   [facility] Preparing for Surgery and or MRSA Information   _x___ Take anti-hypertensive listed below, cardiac, seizure, asthma,     anti-reflux and psychiatric medicines. These include:  1. albuterol (VENTOLIN HFA) 108   2.BYSTOLIC  3.escitalopram (LEXAPRO) 10 MG tablet  4.fluticasone furoate-vilanterol (BREO ELLIPTA) 200-25  MCG/INH AEPB  5.gabapentin (NEURONTIN  6.lamoTRIgine (LAMICTAL) 100 MG tablet  7.pantoprazole (PROTONIX) 40 MG tablet  ____Fleets enema or Magnesium Citrate as directed.   _x___ Use CHG Soap or sage wipes as directed on instruction sheet   _x___ Use inhalers on the day of surgery and bring to hospital day of surgery  ____ Stop Metformin and Janumet 2 days prior to surgery.    ____ Take 1/2 of usual insulin dose the night before surgery and none on the morning     surgery.   _x___ Follow recommendations from Cardiologist, Pulmonologist or PCP regarding          stopping Aspirin, Coumadin, Plavix ,Eliquis, Effient, or Pradaxa, and Pletal.  X____Stop Anti-inflammatories such as Advil, Aleve, Ibuprofen, Motrin, Naproxen, Naprosyn, Goodies powders or aspirin products. OK to take Tylenol and                           Celebrex.   _x___ Stop supplements until after surgery.  But may continue Vitamin D, Vitamin B,       and multivitamin.   ____ Bring C-Pap to the hospital.

## 2018-07-23 LAB — NOVEL CORONAVIRUS, NAA (HOSP ORDER, SEND-OUT TO REF LAB; TAT 18-24 HRS): SARS-CoV-2, NAA: NOT DETECTED

## 2018-07-26 ENCOUNTER — Inpatient Hospital Stay
Admission: RE | Admit: 2018-07-26 | Discharge: 2018-07-28 | DRG: 483 | Disposition: A | Discharge status: Home Health | Payer: Medicare Other | Attending: Surgery | Admitting: Surgery

## 2018-07-26 ENCOUNTER — Encounter
Admission: RE | Disposition: A | Discharge status: Home Health | Payer: Self-pay | Source: Home / Self Care | Attending: Surgery

## 2018-07-26 ENCOUNTER — Encounter: Payer: Self-pay | Admitting: Anesthesiology

## 2018-07-26 ENCOUNTER — Other Ambulatory Visit: Payer: Self-pay

## 2018-07-26 ENCOUNTER — Inpatient Hospital Stay: Payer: Medicare Other | Admitting: Certified Registered Nurse Anesthetist

## 2018-07-26 ENCOUNTER — Inpatient Hospital Stay: Payer: Medicare Other

## 2018-07-26 DIAGNOSIS — Z79899 Other long term (current) drug therapy: Secondary | ICD-10-CM

## 2018-07-26 DIAGNOSIS — M48061 Spinal stenosis, lumbar region without neurogenic claudication: Secondary | ICD-10-CM | POA: Diagnosis present

## 2018-07-26 DIAGNOSIS — Z885 Allergy status to narcotic agent status: Secondary | ICD-10-CM

## 2018-07-26 DIAGNOSIS — Z9842 Cataract extraction status, left eye: Secondary | ICD-10-CM

## 2018-07-26 DIAGNOSIS — K08109 Complete loss of teeth, unspecified cause, unspecified class: Secondary | ICD-10-CM | POA: Diagnosis present

## 2018-07-26 DIAGNOSIS — E1142 Type 2 diabetes mellitus with diabetic polyneuropathy: Secondary | ICD-10-CM | POA: Diagnosis present

## 2018-07-26 DIAGNOSIS — Z9841 Cataract extraction status, right eye: Secondary | ICD-10-CM

## 2018-07-26 DIAGNOSIS — T380X5A Adverse effect of glucocorticoids and synthetic analogues, initial encounter: Secondary | ICD-10-CM | POA: Diagnosis present

## 2018-07-26 DIAGNOSIS — M25519 Pain in unspecified shoulder: Secondary | ICD-10-CM

## 2018-07-26 DIAGNOSIS — E1122 Type 2 diabetes mellitus with diabetic chronic kidney disease: Secondary | ICD-10-CM | POA: Diagnosis present

## 2018-07-26 DIAGNOSIS — M75102 Unspecified rotator cuff tear or rupture of left shoulder, not specified as traumatic: Secondary | ICD-10-CM | POA: Diagnosis present

## 2018-07-26 DIAGNOSIS — M170 Bilateral primary osteoarthritis of knee: Secondary | ICD-10-CM | POA: Diagnosis present

## 2018-07-26 DIAGNOSIS — Z9851 Tubal ligation status: Secondary | ICD-10-CM | POA: Diagnosis not present

## 2018-07-26 DIAGNOSIS — Z981 Arthrodesis status: Secondary | ICD-10-CM | POA: Diagnosis not present

## 2018-07-26 DIAGNOSIS — N183 Chronic kidney disease, stage 3 (moderate): Secondary | ICD-10-CM | POA: Diagnosis present

## 2018-07-26 DIAGNOSIS — K219 Gastro-esophageal reflux disease without esophagitis: Secondary | ICD-10-CM | POA: Diagnosis present

## 2018-07-26 DIAGNOSIS — I2510 Atherosclerotic heart disease of native coronary artery without angina pectoris: Secondary | ICD-10-CM | POA: Diagnosis present

## 2018-07-26 DIAGNOSIS — M810 Age-related osteoporosis without current pathological fracture: Secondary | ICD-10-CM | POA: Diagnosis present

## 2018-07-26 DIAGNOSIS — Z887 Allergy status to serum and vaccine status: Secondary | ICD-10-CM

## 2018-07-26 DIAGNOSIS — M87112 Osteonecrosis due to drugs, left shoulder: Secondary | ICD-10-CM | POA: Diagnosis present

## 2018-07-26 DIAGNOSIS — Z803 Family history of malignant neoplasm of breast: Secondary | ICD-10-CM

## 2018-07-26 DIAGNOSIS — G4733 Obstructive sleep apnea (adult) (pediatric): Secondary | ICD-10-CM | POA: Diagnosis present

## 2018-07-26 DIAGNOSIS — Z96612 Presence of left artificial shoulder joint: Secondary | ICD-10-CM

## 2018-07-26 DIAGNOSIS — Z881 Allergy status to other antibiotic agents status: Secondary | ICD-10-CM

## 2018-07-26 DIAGNOSIS — Z801 Family history of malignant neoplasm of trachea, bronchus and lung: Secondary | ICD-10-CM

## 2018-07-26 DIAGNOSIS — I129 Hypertensive chronic kidney disease with stage 1 through stage 4 chronic kidney disease, or unspecified chronic kidney disease: Secondary | ICD-10-CM | POA: Diagnosis present

## 2018-07-26 DIAGNOSIS — Z79890 Hormone replacement therapy (postmenopausal): Secondary | ICD-10-CM | POA: Diagnosis not present

## 2018-07-26 DIAGNOSIS — Z9049 Acquired absence of other specified parts of digestive tract: Secondary | ICD-10-CM

## 2018-07-26 DIAGNOSIS — J449 Chronic obstructive pulmonary disease, unspecified: Secondary | ICD-10-CM | POA: Diagnosis present

## 2018-07-26 DIAGNOSIS — Z961 Presence of intraocular lens: Secondary | ICD-10-CM | POA: Diagnosis present

## 2018-07-26 DIAGNOSIS — Z96642 Presence of left artificial hip joint: Secondary | ICD-10-CM | POA: Diagnosis present

## 2018-07-26 DIAGNOSIS — Z833 Family history of diabetes mellitus: Secondary | ICD-10-CM

## 2018-07-26 DIAGNOSIS — F319 Bipolar disorder, unspecified: Secondary | ICD-10-CM | POA: Diagnosis present

## 2018-07-26 DIAGNOSIS — Z8249 Family history of ischemic heart disease and other diseases of the circulatory system: Secondary | ICD-10-CM

## 2018-07-26 DIAGNOSIS — Z8711 Personal history of peptic ulcer disease: Secondary | ICD-10-CM

## 2018-07-26 DIAGNOSIS — G8929 Other chronic pain: Secondary | ICD-10-CM

## 2018-07-26 DIAGNOSIS — Z87892 Personal history of anaphylaxis: Secondary | ICD-10-CM

## 2018-07-26 DIAGNOSIS — Z882 Allergy status to sulfonamides status: Secondary | ICD-10-CM

## 2018-07-26 DIAGNOSIS — Z96651 Presence of right artificial knee joint: Secondary | ICD-10-CM | POA: Diagnosis present

## 2018-07-26 DIAGNOSIS — E785 Hyperlipidemia, unspecified: Secondary | ICD-10-CM | POA: Diagnosis present

## 2018-07-26 DIAGNOSIS — Z888 Allergy status to other drugs, medicaments and biological substances status: Secondary | ICD-10-CM

## 2018-07-26 DIAGNOSIS — E039 Hypothyroidism, unspecified: Secondary | ICD-10-CM | POA: Diagnosis present

## 2018-07-26 DIAGNOSIS — M879 Osteonecrosis, unspecified: Secondary | ICD-10-CM | POA: Diagnosis present

## 2018-07-26 DIAGNOSIS — Z91041 Radiographic dye allergy status: Secondary | ICD-10-CM

## 2018-07-26 DIAGNOSIS — M069 Rheumatoid arthritis, unspecified: Secondary | ICD-10-CM | POA: Diagnosis present

## 2018-07-26 HISTORY — DX: Presence of left artificial shoulder joint: Z96.612

## 2018-07-26 HISTORY — PX: REVERSE SHOULDER ARTHROPLASTY: SHX5054

## 2018-07-26 LAB — GLUCOSE, CAPILLARY
Glucose-Capillary: 87 mg/dL (ref 70–99)
Glucose-Capillary: 202 mg/dL — ABNORMAL HIGH (ref 70–99)

## 2018-07-26 SURGERY — ARTHROPLASTY, SHOULDER, TOTAL, REVERSE
Anesthesia: General | Laterality: Left

## 2018-07-26 MED ORDER — VANCOMYCIN HCL IN DEXTROSE 1-5 GM/200ML-% IV SOLN
1000.0000 mg | Freq: Two times a day (BID) | INTRAVENOUS | Status: AC
  Administered 2018-07-26: 1000 mg via INTRAVENOUS
  Filled 2018-07-26: qty 200

## 2018-07-26 MED ORDER — BUPIVACAINE LIPOSOME 1.3 % IJ SUSP
INTRAMUSCULAR | Status: AC
  Filled 2018-07-26: qty 20

## 2018-07-26 MED ORDER — SODIUM CHLORIDE 0.9 % IV SOLN
INTRAVENOUS | Status: DC
  Administered 2018-07-26 – 2018-07-27 (×2): via INTRAVENOUS

## 2018-07-26 MED ORDER — ONDANSETRON HCL 4 MG/2ML IJ SOLN: 4.0000 mg | Freq: Four times a day (QID) | INTRAMUSCULAR | Status: DC | PRN

## 2018-07-26 MED ORDER — ACETAMINOPHEN 500 MG PO TABS
1000.0000 mg | ORAL_TABLET | Freq: Four times a day (QID) | ORAL | Status: AC
  Administered 2018-07-26 – 2018-07-27 (×4): 1000 mg via ORAL
  Filled 2018-07-26 (×4): qty 2

## 2018-07-26 MED ORDER — SEVOFLURANE IN SOLN
RESPIRATORY_TRACT | Status: AC
  Filled 2018-07-26: qty 250

## 2018-07-26 MED ORDER — QUETIAPINE FUMARATE 25 MG PO TABS
25.0000 mg | ORAL_TABLET | Freq: Every day | ORAL | Status: DC
  Administered 2018-07-26 – 2018-07-27 (×2): 25 mg via ORAL
  Filled 2018-07-26 (×2): qty 1

## 2018-07-26 MED ORDER — OCUVITE-LUTEIN PO CAPS
1.0000 | ORAL_CAPSULE | Freq: Two times a day (BID) | ORAL | Status: DC
  Administered 2018-07-26 – 2018-07-28 (×4): 1 via ORAL
  Filled 2018-07-26 (×6): qty 1

## 2018-07-26 MED ORDER — DIPHENHYDRAMINE HCL 12.5 MG/5ML PO ELIX: 12.5000 mg | ORAL_SOLUTION | ORAL | Status: DC | PRN

## 2018-07-26 MED ORDER — BISACODYL 10 MG RE SUPP: 10.0000 mg | Freq: Every day | RECTAL | Status: DC | PRN

## 2018-07-26 MED ORDER — FENTANYL CITRATE (PF) 100 MCG/2ML IJ SOLN
INTRAMUSCULAR | Status: DC | PRN
  Administered 2018-07-26 (×2): 50 ug via INTRAVENOUS
  Administered 2018-07-26 (×2): 25 ug via INTRAVENOUS

## 2018-07-26 MED ORDER — METHYLPREDNISOLONE SODIUM SUCC 125 MG IJ SOLR
INTRAMUSCULAR | Status: DC | PRN
  Administered 2018-07-26: 125 mg via INTRAVENOUS

## 2018-07-26 MED ORDER — BUPIVACAINE-EPINEPHRINE (PF) 0.5% -1:200000 IJ SOLN
INTRAMUSCULAR | Status: AC
  Filled 2018-07-26: qty 30

## 2018-07-26 MED ORDER — IPRATROPIUM-ALBUTEROL 0.5-2.5 (3) MG/3ML IN SOLN: 3.0000 mL | Freq: Three times a day (TID) | RESPIRATORY_TRACT | Status: DC | PRN

## 2018-07-26 MED ORDER — GABAPENTIN 300 MG PO CAPS
900.0000 mg | ORAL_CAPSULE | Freq: Every day | ORAL | Status: DC
  Administered 2018-07-26 – 2018-07-27 (×2): 900 mg via ORAL
  Filled 2018-07-26 (×2): qty 3

## 2018-07-26 MED ORDER — METOCLOPRAMIDE HCL 10 MG PO TABS: 5.0000 mg | ORAL_TABLET | Freq: Three times a day (TID) | ORAL | Status: DC | PRN

## 2018-07-26 MED ORDER — ACETAMINOPHEN 10 MG/ML IV SOLN
INTRAVENOUS | Status: AC
  Filled 2018-07-26: qty 100

## 2018-07-26 MED ORDER — TRAMADOL HCL 50 MG PO TABS
50.0000 mg | ORAL_TABLET | Freq: Four times a day (QID) | ORAL | Status: DC | PRN
  Administered 2018-07-26 – 2018-07-28 (×3): 50 mg via ORAL
  Filled 2018-07-26 (×3): qty 1

## 2018-07-26 MED ORDER — FENTANYL CITRATE (PF) 100 MCG/2ML IJ SOLN
25.0000 ug | INTRAMUSCULAR | Status: DC | PRN
  Administered 2018-07-26 (×4): 25 ug via INTRAVENOUS

## 2018-07-26 MED ORDER — METHYLPREDNISOLONE SODIUM SUCC 125 MG IJ SOLR
INTRAMUSCULAR | Status: AC
  Filled 2018-07-26: qty 2

## 2018-07-26 MED ORDER — LEFLUNOMIDE 20 MG PO TABS
20.0000 mg | ORAL_TABLET | Freq: Every day | ORAL | Status: DC
  Administered 2018-07-26 – 2018-07-28 (×3): 20 mg via ORAL
  Filled 2018-07-26 (×3): qty 1

## 2018-07-26 MED ORDER — ALPRAZOLAM 0.25 MG PO TABS
0.2500 mg | ORAL_TABLET | Freq: Every day | ORAL | Status: DC
  Administered 2018-07-26 – 2018-07-27 (×2): 0.25 mg via ORAL
  Filled 2018-07-26 (×2): qty 1

## 2018-07-26 MED ORDER — TRANEXAMIC ACID 1000 MG/10ML IV SOLN
INTRAVENOUS | Status: DC | PRN
  Administered 2018-07-26: 1000 mg via TOPICAL

## 2018-07-26 MED ORDER — ACETAMINOPHEN 325 MG PO TABS: 325.0000 mg | ORAL_TABLET | Freq: Four times a day (QID) | ORAL | Status: DC | PRN

## 2018-07-26 MED ORDER — PRAVASTATIN SODIUM 20 MG PO TABS
40.0000 mg | ORAL_TABLET | Freq: Every day | ORAL | Status: DC
  Administered 2018-07-26 – 2018-07-27 (×2): 40 mg via ORAL
  Filled 2018-07-26 (×2): qty 2

## 2018-07-26 MED ORDER — PHENYLEPHRINE HCL (PRESSORS) 10 MG/ML IV SOLN
INTRAVENOUS | Status: AC
  Filled 2018-07-26: qty 1

## 2018-07-26 MED ORDER — NEBIVOLOL HCL 5 MG PO TABS
5.0000 mg | ORAL_TABLET | Freq: Every day | ORAL | Status: DC
  Administered 2018-07-27 – 2018-07-28 (×2): 5 mg via ORAL
  Filled 2018-07-26 (×2): qty 1

## 2018-07-26 MED ORDER — GABAPENTIN 300 MG PO CAPS
600.0000 mg | ORAL_CAPSULE | Freq: Every morning | ORAL | Status: DC
  Administered 2018-07-27 – 2018-07-28 (×2): 600 mg via ORAL
  Filled 2018-07-26 (×2): qty 2

## 2018-07-26 MED ORDER — GABAPENTIN 300 MG PO CAPS: 600.0000 mg | ORAL_CAPSULE | ORAL | Status: DC

## 2018-07-26 MED ORDER — METOCLOPRAMIDE HCL 5 MG/ML IJ SOLN: 5.0000 mg | Freq: Three times a day (TID) | INTRAMUSCULAR | Status: DC | PRN

## 2018-07-26 MED ORDER — FENTANYL CITRATE (PF) 100 MCG/2ML IJ SOLN
INTRAMUSCULAR | Status: AC
  Filled 2018-07-26: qty 2

## 2018-07-26 MED ORDER — ALBUTEROL SULFATE HFA 108 (90 BASE) MCG/ACT IN AERS: 2.0000 | INHALATION_SPRAY | Freq: Four times a day (QID) | RESPIRATORY_TRACT | Status: DC | PRN

## 2018-07-26 MED ORDER — FLEET ENEMA 7-19 GM/118ML RE ENEM: 1.0000 | ENEMA | Freq: Once | RECTAL | Status: DC | PRN

## 2018-07-26 MED ORDER — ADALIMUMAB 40 MG/0.4ML ~~LOC~~ AJKT: 40.0000 mg | AUTO-INJECTOR | SUBCUTANEOUS | Status: DC

## 2018-07-26 MED ORDER — ONDANSETRON HCL 4 MG PO TABS: 4.0000 mg | ORAL_TABLET | Freq: Four times a day (QID) | ORAL | Status: DC | PRN

## 2018-07-26 MED ORDER — LIDOCAINE HCL (PF) 2 % IJ SOLN
INTRAMUSCULAR | Status: AC
  Filled 2018-07-26: qty 10

## 2018-07-26 MED ORDER — ROCURONIUM BROMIDE 50 MG/5ML IV SOLN
INTRAVENOUS | Status: AC
  Filled 2018-07-26: qty 1

## 2018-07-26 MED ORDER — FENTANYL CITRATE (PF) 100 MCG/2ML IJ SOLN
INTRAMUSCULAR | Status: AC
  Administered 2018-07-26: 25 ug via INTRAVENOUS
  Filled 2018-07-26: qty 2

## 2018-07-26 MED ORDER — LAMOTRIGINE 100 MG PO TABS
100.0000 mg | ORAL_TABLET | Freq: Two times a day (BID) | ORAL | Status: DC
  Administered 2018-07-26 – 2018-07-28 (×4): 100 mg via ORAL
  Filled 2018-07-26 (×4): qty 4
  Filled 2018-07-26 (×2): qty 1
  Filled 2018-07-26: qty 4
  Filled 2018-07-26 (×3): qty 1

## 2018-07-26 MED ORDER — ONDANSETRON HCL 4 MG/2ML IJ SOLN
INTRAMUSCULAR | Status: AC
  Filled 2018-07-26: qty 2

## 2018-07-26 MED ORDER — FLUTICASONE FUROATE-VILANTEROL 200-25 MCG/INH IN AEPB
1.0000 | INHALATION_SPRAY | Freq: Every day | RESPIRATORY_TRACT | Status: DC
  Administered 2018-07-27 – 2018-07-28 (×2): 1 via RESPIRATORY_TRACT
  Filled 2018-07-26: qty 28

## 2018-07-26 MED ORDER — ONDANSETRON HCL 4 MG/2ML IJ SOLN
INTRAMUSCULAR | Status: DC | PRN
  Administered 2018-07-26: 4 mg via INTRAVENOUS

## 2018-07-26 MED ORDER — TIOTROPIUM BROMIDE MONOHYDRATE 18 MCG IN CAPS
1.0000 | ORAL_CAPSULE | Freq: Every day | RESPIRATORY_TRACT | Status: DC
  Administered 2018-07-26 – 2018-07-27 (×2): 18 ug via RESPIRATORY_TRACT
  Filled 2018-07-26: qty 5

## 2018-07-26 MED ORDER — BENZONATATE 100 MG PO CAPS: 200.0000 mg | ORAL_CAPSULE | Freq: Three times a day (TID) | ORAL | Status: DC | PRN

## 2018-07-26 MED ORDER — LIDOCAINE HCL (CARDIAC) PF 100 MG/5ML IV SOSY
PREFILLED_SYRINGE | INTRAVENOUS | Status: DC | PRN
  Administered 2018-07-26: 60 mg via INTRAVENOUS

## 2018-07-26 MED ORDER — SUCCINYLCHOLINE CHLORIDE 20 MG/ML IJ SOLN
INTRAMUSCULAR | Status: AC
  Filled 2018-07-26: qty 1

## 2018-07-26 MED ORDER — ROCURONIUM BROMIDE 100 MG/10ML IV SOLN
INTRAVENOUS | Status: DC | PRN
  Administered 2018-07-26: 10 mg via INTRAVENOUS
  Administered 2018-07-26: 40 mg via INTRAVENOUS
  Administered 2018-07-26: 10 mg via INTRAVENOUS

## 2018-07-26 MED ORDER — HYDROMORPHONE HCL 1 MG/ML IJ SOLN: 0.2500 mg | INTRAMUSCULAR | Status: DC | PRN

## 2018-07-26 MED ORDER — TRANEXAMIC ACID 1000 MG/10ML IV SOLN
INTRAVENOUS | Status: AC
  Filled 2018-07-26: qty 10

## 2018-07-26 MED ORDER — PANTOPRAZOLE SODIUM 40 MG PO TBEC
40.0000 mg | DELAYED_RELEASE_TABLET | Freq: Two times a day (BID) | ORAL | Status: DC
  Administered 2018-07-26 – 2018-07-28 (×4): 40 mg via ORAL
  Filled 2018-07-26 (×4): qty 1

## 2018-07-26 MED ORDER — PHENYLEPHRINE HCL (PRESSORS) 10 MG/ML IV SOLN
INTRAVENOUS | Status: DC | PRN
  Administered 2018-07-26 (×2): 100 ug via INTRAVENOUS

## 2018-07-26 MED ORDER — VANCOMYCIN HCL IN DEXTROSE 1-5 GM/200ML-% IV SOLN
INTRAVENOUS | Status: AC
  Administered 2018-07-26: 1000 mg via INTRAVENOUS
  Filled 2018-07-26: qty 200

## 2018-07-26 MED ORDER — ONDANSETRON HCL 4 MG/2ML IJ SOLN: 4.0000 mg | Freq: Once | INTRAMUSCULAR | Status: DC | PRN

## 2018-07-26 MED ORDER — SUGAMMADEX SODIUM 200 MG/2ML IV SOLN
INTRAVENOUS | Status: AC
  Filled 2018-07-26: qty 2

## 2018-07-26 MED ORDER — EPHEDRINE SULFATE 50 MG/ML IJ SOLN
INTRAMUSCULAR | Status: AC
  Filled 2018-07-26: qty 1

## 2018-07-26 MED ORDER — MONTELUKAST SODIUM 10 MG PO TABS
10.0000 mg | ORAL_TABLET | Freq: Every day | ORAL | Status: DC
  Administered 2018-07-26 – 2018-07-28 (×3): 10 mg via ORAL
  Filled 2018-07-26 (×3): qty 1

## 2018-07-26 MED ORDER — SODIUM CHLORIDE 0.9 % IV SOLN
INTRAVENOUS | Status: DC | PRN
  Administered 2018-07-26: 09:00:00 60 mL

## 2018-07-26 MED ORDER — PROPOFOL 10 MG/ML IV BOLUS
INTRAVENOUS | Status: AC
  Filled 2018-07-26: qty 60

## 2018-07-26 MED ORDER — PROPOFOL 10 MG/ML IV BOLUS
INTRAVENOUS | Status: DC | PRN
  Administered 2018-07-26: 40 mg via INTRAVENOUS
  Administered 2018-07-26: 80 mg via INTRAVENOUS

## 2018-07-26 MED ORDER — DEXAMETHASONE SODIUM PHOSPHATE 10 MG/ML IJ SOLN
INTRAMUSCULAR | Status: DC | PRN
  Administered 2018-07-26: 5 mg via INTRAVENOUS

## 2018-07-26 MED ORDER — ACETAMINOPHEN 10 MG/ML IV SOLN
INTRAVENOUS | Status: DC | PRN
  Administered 2018-07-26: 1000 mg via INTRAVENOUS

## 2018-07-26 MED ORDER — ALBUTEROL SULFATE (2.5 MG/3ML) 0.083% IN NEBU: 2.5000 mg | INHALATION_SOLUTION | Freq: Four times a day (QID) | RESPIRATORY_TRACT | Status: DC | PRN

## 2018-07-26 MED ORDER — DEXAMETHASONE SODIUM PHOSPHATE 10 MG/ML IJ SOLN
INTRAMUSCULAR | Status: AC
  Filled 2018-07-26: qty 1

## 2018-07-26 MED ORDER — MIRABEGRON ER 50 MG PO TB24
50.0000 mg | ORAL_TABLET | Freq: Every day | ORAL | Status: DC
  Administered 2018-07-26 – 2018-07-27 (×2): 50 mg via ORAL
  Filled 2018-07-26 (×3): qty 1

## 2018-07-26 MED ORDER — FUROSEMIDE 20 MG PO TABS: 20.0000 mg | ORAL_TABLET | Freq: Every day | ORAL | Status: DC | PRN

## 2018-07-26 MED ORDER — ENOXAPARIN SODIUM 40 MG/0.4ML ~~LOC~~ SOLN
40.0000 mg | SUBCUTANEOUS | Status: DC
  Filled 2018-07-26: qty 0.4

## 2018-07-26 MED ORDER — BUPIVACAINE-EPINEPHRINE (PF) 0.5% -1:200000 IJ SOLN
INTRAMUSCULAR | Status: DC | PRN
  Administered 2018-07-26: 30 mL

## 2018-07-26 MED ORDER — OXYCODONE HCL 5 MG PO TABS
5.0000 mg | ORAL_TABLET | ORAL | Status: DC | PRN
  Administered 2018-07-26: 10 mg via ORAL
  Administered 2018-07-27 – 2018-07-28 (×6): 5 mg via ORAL
  Filled 2018-07-26 (×5): qty 1
  Filled 2018-07-26: qty 2
  Filled 2018-07-26: qty 1

## 2018-07-26 MED ORDER — CLOTRIMAZOLE 1 % EX CREA: 1.0000 "application " | TOPICAL_CREAM | Freq: Two times a day (BID) | CUTANEOUS | Status: DC | PRN

## 2018-07-26 MED ORDER — DOCUSATE SODIUM 100 MG PO CAPS
100.0000 mg | ORAL_CAPSULE | Freq: Two times a day (BID) | ORAL | Status: DC
  Administered 2018-07-26 – 2018-07-27 (×3): 100 mg via ORAL
  Filled 2018-07-26 (×3): qty 1

## 2018-07-26 MED ORDER — ESCITALOPRAM OXALATE 10 MG PO TABS
10.0000 mg | ORAL_TABLET | Freq: Every day | ORAL | Status: DC
  Administered 2018-07-27 – 2018-07-28 (×2): 10 mg via ORAL
  Filled 2018-07-26 (×2): qty 1

## 2018-07-26 MED ORDER — MAGNESIUM HYDROXIDE 400 MG/5ML PO SUSP: 30.0000 mL | Freq: Every day | ORAL | Status: DC | PRN

## 2018-07-26 MED ORDER — MELATONIN 5 MG PO TABS
10.0000 mg | ORAL_TABLET | Freq: Every day | ORAL | Status: DC
  Administered 2018-07-26 – 2018-07-27 (×2): 10 mg via ORAL
  Filled 2018-07-26 (×3): qty 2

## 2018-07-26 MED ORDER — SUGAMMADEX SODIUM 200 MG/2ML IV SOLN
INTRAVENOUS | Status: DC | PRN
  Administered 2018-07-26: 150 mg via INTRAVENOUS

## 2018-07-26 MED ORDER — SODIUM CHLORIDE 0.9 % IV SOLN
INTRAVENOUS | Status: DC
  Administered 2018-07-26: 08:00:00 via INTRAVENOUS

## 2018-07-26 MED ORDER — VANCOMYCIN HCL IN DEXTROSE 1-5 GM/200ML-% IV SOLN
1000.0000 mg | Freq: Once | INTRAVENOUS | Status: AC
  Administered 2018-07-26: 1000 mg via INTRAVENOUS

## 2018-07-26 MED ORDER — TERIPARATIDE (RECOMBINANT) 600 MCG/2.4ML ~~LOC~~ SOLN: 0.0800 mL | Freq: Every day | SUBCUTANEOUS | Status: DC

## 2018-07-26 MED ORDER — SODIUM CHLORIDE FLUSH 0.9 % IV SOLN
INTRAVENOUS | Status: AC
  Filled 2018-07-26: qty 40

## 2018-07-26 MED ORDER — EPHEDRINE SULFATE 50 MG/ML IJ SOLN
INTRAMUSCULAR | Status: DC | PRN
  Administered 2018-07-26: 5 mg via INTRAVENOUS
  Administered 2018-07-26: 7.5 mg via INTRAVENOUS

## 2018-07-26 SURGICAL SUPPLY — 63 items
BASEPLATE GLENOSPHERE 25 (Plate) ×1 IMPLANT
BEARING HUMERAL SHLDER 36M STD (Shoulder) IMPLANT
BIT DRILL TWIST 2.7 (BIT) ×1 IMPLANT
BLADE SAW SAG 25X90X1.19 (BLADE) ×2 IMPLANT
CANISTER SUCT 1200ML W/VALVE (MISCELLANEOUS) ×2 IMPLANT
CANISTER SUCT 3000ML PPV (MISCELLANEOUS) ×4 IMPLANT
CHLORAPREP W/TINT 26 (MISCELLANEOUS) ×2 IMPLANT
COOLER POLAR GLACIER W/PUMP (MISCELLANEOUS) ×2 IMPLANT
COVER WAND RF STERILE (DRAPES) ×2 IMPLANT
CRADLE LAMINECT ARM (MISCELLANEOUS) ×2 IMPLANT
DRAPE IMP U-DRAPE 54X76 (DRAPES) ×4 IMPLANT
DRAPE INCISE IOBAN 66X45 STRL (DRAPES) ×4 IMPLANT
DRAPE SHEET LG 3/4 BI-LAMINATE (DRAPES) ×4 IMPLANT
DRAPE TABLE BACK 80X90 (DRAPES) ×2 IMPLANT
DRSG OPSITE POSTOP 4X8 (GAUZE/BANDAGES/DRESSINGS) ×2 IMPLANT
ELECT BLADE 6.5 EXT (BLADE) IMPLANT
ELECT CAUTERY BLADE 6.4 (BLADE) ×2 IMPLANT
GLENOID SPHERE STD STRL 36MM (Orthopedic Implant) ×1 IMPLANT
GLOVE BIO SURGEON STRL SZ7.5 (GLOVE) ×8 IMPLANT
GLOVE BIO SURGEON STRL SZ8 (GLOVE) ×8 IMPLANT
GLOVE BIOGEL PI IND STRL 8 (GLOVE) ×1 IMPLANT
GLOVE BIOGEL PI INDICATOR 8 (GLOVE) ×1
GLOVE INDICATOR 8.0 STRL GRN (GLOVE) ×2 IMPLANT
GOWN STRL REUS W/ TWL LRG LVL3 (GOWN DISPOSABLE) ×1 IMPLANT
GOWN STRL REUS W/ TWL XL LVL3 (GOWN DISPOSABLE) ×1 IMPLANT
GOWN STRL REUS W/TWL LRG LVL3 (GOWN DISPOSABLE) ×2
GOWN STRL REUS W/TWL XL LVL3 (GOWN DISPOSABLE) ×1
HOOD PEEL AWAY FLYTE STAYCOOL (MISCELLANEOUS) ×6 IMPLANT
KIT STABILIZATION SHOULDER (MISCELLANEOUS) ×2 IMPLANT
KIT TURNOVER KIT A (KITS) ×2 IMPLANT
MASK FACE SPIDER DISP (MASK) ×2 IMPLANT
MAT ABSORB  FLUID 56X50 GRAY (MISCELLANEOUS) ×1
MAT ABSORB FLUID 56X50 GRAY (MISCELLANEOUS) ×1 IMPLANT
NDL SAFETY ECLIPSE 18X1.5 (NEEDLE) ×1 IMPLANT
NDL SPNL 20GX3.5 QUINCKE YW (NEEDLE) ×1 IMPLANT
NEEDLE HYPO 18GX1.5 SHARP (NEEDLE) ×1
NEEDLE HYPO 22GX1.5 SAFETY (NEEDLE) ×2 IMPLANT
NEEDLE SPNL 20GX3.5 QUINCKE YW (NEEDLE) ×2 IMPLANT
NS IRRIG 500ML POUR BTL (IV SOLUTION) ×2 IMPLANT
PACK ARTHROSCOPY SHOULDER (MISCELLANEOUS) ×2 IMPLANT
PAD WRAPON POLAR SHDR UNIV (MISCELLANEOUS) ×1 IMPLANT
PIN THREADED REVERSE (PIN) ×1 IMPLANT
PULSAVAC PLUS IRRIG FAN TIP (DISPOSABLE) ×2
SCREW BONE LOCKING 4.75X30X3.5 (Screw) ×2 IMPLANT
SCREW BONE STRL 6.5MMX25MM (Screw) ×1 IMPLANT
SCREW NON-LOCK 4.75MMX15MM (Screw) ×2 IMPLANT
SHOULDER HUMERAL BEAR 36M STD (Shoulder) ×2 IMPLANT
SLING ULTRA II M (MISCELLANEOUS) ×2 IMPLANT
SOL .9 NS 3000ML IRR  AL (IV SOLUTION) ×1
SOL .9 NS 3000ML IRR UROMATIC (IV SOLUTION) ×1 IMPLANT
SPONGE LAP 18X18 RF (DISPOSABLE) ×2 IMPLANT
STAPLER SKIN PROX 35W (STAPLE) ×2 IMPLANT
SUT ETHIBOND 0 MO6 C/R (SUTURE) ×2 IMPLANT
SUT FIBERWIRE #2 38 BLUE 1/2 (SUTURE) ×8
SUT VIC AB 0 CT1 36 (SUTURE) ×2 IMPLANT
SUT VIC AB 2-0 CT1 27 (SUTURE) ×2
SUT VIC AB 2-0 CT1 TAPERPNT 27 (SUTURE) ×2 IMPLANT
SUTURE FIBERWR #2 38 BLUE 1/2 (SUTURE) ×4 IMPLANT
SYR 10ML LL (SYRINGE) ×2 IMPLANT
SYR 30ML LL (SYRINGE) ×2 IMPLANT
TIP FAN IRRIG PULSAVAC PLUS (DISPOSABLE) ×1 IMPLANT
TRAY HUM REV SHOULDER STD +6 (Shoulder) ×1 IMPLANT
WRAPON POLAR PAD SHDR UNIV (MISCELLANEOUS) ×2

## 2018-07-26 NOTE — H&P (Signed)
Paper H&P to be scanned into permanent record. H&P reviewed and patient re-examined. No changes. 

## 2018-07-26 NOTE — OR Nursing (Signed)
No profend given due to allergy to iodine

## 2018-07-26 NOTE — Progress Notes (Signed)
OT Cancellation Note  Patient Details Name: Kerri Carter MRN: [id number] DOB: April 05, 1940   Cancelled Treatment:    Reason Eval/Treat Not Completed: Other (comment). Order received, chart reviewed. Pt sleeping heavily upon attempt. Will re-attempt OT evaluation next date.  Jeni Salles, MPH, MS, OTR/L ascom [phone] 07/26/18, 4:12 PM

## 2018-07-26 NOTE — Anesthesia Post-op Follow-up Note (Signed)
Anesthesia QCDR form completed.        

## 2018-07-26 NOTE — Transfer of Care (Signed)
Immediate Anesthesia Transfer of Care Note  Patient: Kerri Carter  Procedure(s) Performed: REVERSE SHOULDER ARTHROPLASTY (Left )  Patient Location: PACU  Anesthesia Type:General  Level of Consciousness: awake, drowsy and patient cooperative  Airway & Oxygen Therapy: Patient Spontanous Breathing and Patient connected to face mask oxygen  Post-op Assessment: Report given to RN, Post -op Vital signs reviewed and stable and Patient moving all extremities  Post vital signs: Reviewed and stable  Last Vitals:  Vitals Value Taken Time  BP 167/60 07/26/18 1008  Temp    Pulse 76 07/26/18 1009  Resp 12 07/26/18 1009  SpO2 100 % 07/26/18 1009  Vitals shown include unvalidated device data.  Last Pain:  Vitals:   07/26/18 0615  PainSc: 7          Complications: No apparent anesthesia complications

## 2018-07-26 NOTE — Anesthesia Preprocedure Evaluation (Signed)
Anesthesia Evaluation  Patient identified by MRN, date of birth, ID band Patient awake    Reviewed: Allergy & Precautions, H&P , NPO status , Patient's Chart, lab work & pertinent test results, reviewed documented beta blocker date and time   History of Anesthesia Complications Negative for: history of anesthetic complications  Airway Mallampati: III  TM Distance: >3 FB Neck ROM: full    Dental  (+) Edentulous Upper, Edentulous Lower, Upper Dentures, Lower Dentures, Dental Advidsory Given   Pulmonary shortness of breath and with exertion, asthma , sleep apnea , COPD,  COPD inhaler, neg recent URI, former smoker,           Cardiovascular Exercise Tolerance: Good hypertension, (-) angina+ CAD  (-) Past MI, (-) Cardiac Stents and (-) CABG negative cardio ROS  (-) dysrhythmias + Valvular Problems/Murmurs      Neuro/Psych PSYCHIATRIC DISORDERS Anxiety Depression Bipolar Disorder negative neurological ROS     GI/Hepatic Neg liver ROS, PUD, GERD  ,  Endo/Other  negative endocrine ROS  Renal/GU CRFRenal disease  negative genitourinary   Musculoskeletal   Abdominal   Peds  Hematology negative hematology ROS (+)   Anesthesia Other Findings Past Medical History: No date: Anxiety No date: Asthma No date: Cataract     Comment:  s/p b/l repair  No date: Chicken pox No date: CKD (chronic kidney disease) No date: CKD (chronic kidney disease), stage III (HCC)     Comment:  a. s/p R nephrectomy. No date: Conversion disorder No date: COPD (chronic obstructive pulmonary disease) (HCC) No date: Depression No date: Essential hypertension No date: GERD (gastroesophageal reflux disease) No date: Hyperlipidemia No date: Inflammatory arthritis     Comment:  a. hands/carpal tunnel.  b. Low titer rheumatoid factor.              c. Negative anti-CCP antibodies. d. Plaquenil. No date: Non-Obstructive CAD     Comment:  a. 07/2009 Cath  ([facility]): nonobs dzs;  b. 03/2011 Cath               [facility]): nonobs dzs. No date: Osteoarthritis     Comment:  a. Knees. 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 07/2015 Echo: EF 55-60%, Mild AI, AS, MR, and TR.   Reproductive/Obstetrics negative OB ROS                             Anesthesia Physical  Anesthesia Plan  ASA: III  Anesthesia Plan: General   Post-op Pain Management:    Induction: Intravenous  PONV Risk Score and Plan: 3 and Ondansetron and Dexamethasone  Airway Management Planned: Oral ETT  Additional Equipment:   Intra-op Plan:   Post-operative Plan: Extubation in OR  Informed Consent: I have reviewed the patients History and Physical, chart, labs and discussed the procedure including the risks, benefits and alternatives for the proposed anesthesia with the patient or authorized representative who has indicated his/her understanding and acceptance.     Dental Advisory Given  Plan Discussed with: Anesthesiologist, CRNA and Surgeon  Anesthesia Plan Comments:  Anesthesia Quick Evaluation  

## 2018-07-26 NOTE — Op Note (Signed)
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  Post-Op Diagnosis:   Same  Procedure:   Reverse left total shoulder arthroplasty.  Surgeon:   Pascal Lux, MD  Assistant:   [name] Proud, PA-C  Anesthesia:   GET  Findings:   As above.  Complications:   None  EBL:    100 cc  Fluids:   600 cc crystalloid  UOP:   None  TT:   None  Drains:   None  Closure:   Staples  Implants:   All press-fit Biomet Comprehensive system with a #10 micro-humeral stem, a +6 mm offset 40 mm humeral tray with a standard E-polyethylene insert, and a mini-base plate with a 36 mm standard glenosphere.  Brief Clinical Note:   The patient is a 78 year old female with a history of progressively worsening left shoulder pain. Her symptoms have progressed despite medications, activity modification, injections, etc. Her history and examination are consistent with rotator cuff tendinopathy. An MRI scan confirmed the tendinopathy and also demonstrated avascular necrosis of the humeral head with early collapse. The patient presents at this time for a reverse left total shoulder arthroplasty.  Procedure:   The patient was brought into the operating room and lain in the supine position. The patient then underwent general endotracheal intubation and anesthesia before the patient was repositioned in the beach chair position using the beach chair positioner. The left shoulder and upper extremity were prepped with ChloraPrep solution before being draped sterilely. Preoperative antibiotics were administered. A standard anterior approach to the shoulder was made through an approximately 4-5 inch incision. The incision was carried down through the subcutaneous tissues to expose the deltopectoral fascia. The interval between the deltoid and pectoralis muscles was identified and this plane developed, retracting the cephalic  vein laterally with the deltoid muscle. The conjoined tendon was identified. Its lateral margin was dissected and the Kolbel self-retraining retractor inserted. The "three sisters" were identified and cauterized. Bursal tissues were removed to improve visualization. The subscapularis tendon was released from its attachment to the lesser tuberosity 1 cm proximal to its insertion and several tagging sutures placed. The inferior capsule was released with care after identifying and protecting the axillary nerve. The proximal humeral cut was made at approximately 25 of retroversion using the extra-medullary guide.   Attention was redirected to the glenoid. The labrum was debrided circumferentially before the center of the glenoid was marked with electrocautery. The guidewire was drilled into the glenoid neck using the appropriate guide. After verifying its position, it was overreamed with the mini-baseplate reamer to create a flat surface. The permanent mini-baseplate was impacted into place. It was stabilized with a 25 x 6.5 mm central screw and four peripheral screws. Locking screws were placed superiorly and inferiorly while nonlocking screws were placed anteriorly and posteriorly. The permanent 36 mm standard glenosphere was then impacted into place and its Morse taper locking mechanism verified using manual distraction.  Attention was directed to the humeral side. The humeral canal was reamed sequentially beginning with the end-cutting reamer then progressing from a 4 mm reamer up to a 10 mm reamer. This provided excellent circumferential chatter. The canal was broached beginning with a #8 broach and progressing to a #10 broach. This was left in place and a trial reduction performed using the standard trial humeral platform with the +6 mm offset, as this was what she had  on her other side. It was still tight, so the trial components were removed and an additional 3 mm of bone removed from the proximal humerus.  The canal was re-broached with the #9 and #10 broach as before a repeat trial reduction was performed using the standard trial humeral platform with the 6 mm offset. This time, the arm demonstrated excellent range of motion as the hand could be brought across the chest to the opposite shoulder and brought to the top of the patient's head and to the patient's ear. The shoulder appeared stable throughout this range of motion.   The joint was dislocated and the trial components removed. The permanent #10 micro-stem was impacted into place with care taken to maintain the appropriate version. The permanent +6 mm laterally offset 40 mm humeral platform with the standard E-polyethylene insert was put together on the back table and impacted into place. Again, the [facility] taper locking mechanism was verified using manual distraction. The shoulder was relocated using two finger pressure and again placed through a range of motion with the findings as described above.  The wound was copiously irrigated with bacitracin saline solution using the jet lavage system before a total of 20 cc of Exparel diluted out to 60 cc with normal saline and 30 cc of 0.5% Sensorcaine with epinephrine was injected into the pericapsular and peri-incisional tissues to help with postoperative analgesia. The subscapularis tendon was reapproximated using #2 FiberWire interrupted sutures. The deltopectoral interval was closed using #0 Vicryl interrupted sutures before the subcutaneous tissues were closed using 2-0 Vicryl interrupted sutures. The skin was closed using staples. Prior to closing the skin, 1 g of transexemic acid in 10 cc of normal saline was injected intra-articularly to help with postoperative bleeding. A sterile occlusive dressing was applied to the wound before the arm was placed into a shoulder immobilizer with an abduction pillow. A Polar Care system also was applied to the shoulder. The patient was then transferred back to a  hospital bed before being awakened, extubated, and returned to the recovery room in satisfactory condition after tolerating the procedure well.

## 2018-07-26 NOTE — Anesthesia Postprocedure Evaluation (Signed)
Anesthesia Post Note  Patient: Kerri Carter  Procedure(s) Performed: REVERSE SHOULDER ARTHROPLASTY (Left )  Patient location during evaluation: PACU Anesthesia Type: General Level of consciousness: awake and alert and oriented Pain management: pain level controlled Vital Signs Assessment: post-procedure vital signs reviewed and stable Respiratory status: spontaneous breathing Cardiovascular status: blood pressure returned to baseline Anesthetic complications: no     Last Vitals:  Vitals:   07/26/18 1408 07/26/18 1508  BP: 131/60 126/61  Pulse: 73 73  Resp:    Temp: 36.6 C 36.6 C  SpO2: 96% 94%    Last Pain:  Vitals:   07/26/18 1508  TempSrc: Oral  PainSc:                  Johnathon Mittal

## 2018-07-26 NOTE — Anesthesia Procedure Notes (Signed)
Procedure Name: Intubation Date/Time: 07/26/2018 7:47 AM Performed by: Lowry Bowl, CRNA Pre-anesthesia Checklist: Patient identified, Emergency Drugs available, Suction available and Patient being monitored Patient Re-evaluated:Patient Re-evaluated prior to induction Oxygen Delivery Method: Circle system utilized Preoxygenation: Pre-oxygenation with 100% oxygen Induction Type: IV induction and Cricoid Pressure applied Ventilation: Mask ventilation without difficulty Laryngoscope Size: Mac and 3 Grade View: Grade I Tube type: Oral Tube size: 7.0 mm Number of attempts: 1 Airway Equipment and Method: Stylet Placement Confirmation: ETT inserted through vocal cords under direct vision,  positive ETCO2 and breath sounds checked- equal and bilateral Secured at: 21 cm Tube secured with: Tape Dental Injury: Teeth and Oropharynx as per pre-operative assessment

## 2018-07-26 NOTE — Progress Notes (Signed)
Physical Therapy Evaluation Patient Details Name: Kerri Carter MRN: [id number] DOB: [birth year] Today's Date: 07/26/2018   History of Present Illness  Patient is 78 year old female s/p Reverse left total shoulder arthroplasty  Clinical Impression  Patient is sleepy and reports high pain to left shoulder following reverse l total shoulder arthroplasty.She has 4/5 strength BLE hip flex and knee flex/ext.  She needs mod assist for supine to sit bed mobility and min assist for sit to supine bed mobility. She performs sit to stand at edge of bed with HHA x 1 and does not feel able to take any steps due to lethargy, but is able to side step left x 4 steps and right x 4 steps with 2 L O2 and left shoulder abd sling and shoulder ice. She reports high pain and nsg is called to assist with pain medication. She is left in bed with bed alarm. She will benefit from skilled PT to improve mobility and strength.     Follow Up Recommendations SNF    Equipment Recommendations       Recommendations for Other Services       Precautions / Restrictions Precautions Required Braces or Orthoses: Other Brace Other Brace: sliing Restrictions Weight Bearing Restrictions: Yes Other Position/Activity Restrictions: (wearing L shoulder sling with abd pillow and ice)      Mobility  Bed Mobility Overal bed mobility: Needs Assistance Bed Mobility: Supine to Sit;Sit to Supine     Supine to sit: Mod assist Sit to supine: Min assist      Transfers Overall transfer level: Needs assistance Equipment used: 1 person hand held assist Transfers: Sit to/from Stand Sit to Stand: Min guard            Ambulation/Gait Ambulation/Gait assistance: (unable  due to, lethargic and sleepy)              Stairs            Wheelchair Mobility    Modified Rankin (Stroke Patients Only)       Balance Overall balance assessment: Needs assistance   Sitting balance-Leahy Scale: Fair   Postural control:  Posterior lean Standing balance support: Single extremity supported Standing balance-Leahy Scale: Fair Standing balance comment: (needs UE assist)                             Pertinent Vitals/Pain Pain Assessment: 0-10 Pain Score: 8  Pain Location: (left shoulder) Pain Descriptors / Indicators: Aching Pain Intervention(s): Limited activity within patient's tolerance    Home Living Family/patient expects to be discharged to:: Private residence Living Arrangements: Spouse/significant other Available Help at Discharge: Family Type of Home: House Home Access: Ramped entrance     Home Layout: One level Home Equipment: Shower seat      Prior Function Level of Independence: Independent               Hand Dominance        Extremity/Trunk Assessment   Upper Extremity Assessment Upper Extremity Assessment: Defer to OT evaluation    Lower Extremity Assessment Lower Extremity Assessment: Overall WFL for tasks assessed    Cervical / Trunk Assessment Cervical / Trunk Assessment: Normal  Communication   Communication: No difficulties  Cognition Arousal/Alertness: Lethargic Behavior During Therapy: Flat affect Overall Cognitive Status: Within Functional Limits for tasks assessed  General Comments: Patient is sleepy and feel asleep 1 x during  subjective section of evaluation      General Comments      Exercises     Assessment/Plan    PT Assessment Patient needs continued PT services  PT Problem List         PT Treatment Interventions Gait training;Therapeutic exercise    PT Goals (Current goals can be found in the Care Plan section)  Acute Rehab PT Goals Patient Stated Goal: to walk PT Goal Formulation: With patient Time For Goal Achievement: 08/09/18 Potential to Achieve Goals: Good    Frequency BID   Barriers to discharge        Co-evaluation               AM-PAC PT "6 Clicks"  Mobility  Outcome Measure Help needed turning from your back to your side while in a flat bed without using bedrails?: A Lot Help needed moving from lying on your back to sitting on the side of a flat bed without using bedrails?: A Lot Help needed moving to and from a bed to a chair (including a wheelchair)?: A Lot Help needed standing up from a chair using your arms (e.g., wheelchair or bedside chair)?: A Lot Help needed to walk in hospital room?: A Lot Help needed climbing 3-5 steps with a railing? : A Lot 6 Click Score: 12    End of Session Equipment Utilized During Treatment: Gait belt Activity Tolerance: Patient limited by pain;Other (comment) Patient left: in bed;with bed alarm set(bed with alarm)   PT Visit Diagnosis: Unsteadiness on feet (R26.81);Muscle weakness (generalized) (M62.81);Difficulty in walking, not elsewhere classified (R26.2)    Time: 7867-6720 PT Time Calculation (min) (ACUTE ONLY): 30 min   Charges:   PT Evaluation $PT Eval Low Complexity: 1 Low PT Treatments $Therapeutic Activity: 8-22 mins          Friona, Kristine S, PT DPT 07/26/2018, 1:50 PM

## 2018-07-27 ENCOUNTER — Encounter: Payer: Self-pay | Admitting: Surgery

## 2018-07-27 LAB — CBC WITH DIFFERENTIAL/PLATELET
Abs Immature Granulocytes: 0.16 10*3/uL — ABNORMAL HIGH (ref 0.00–0.07)
Basophils Absolute: 0 10*3/uL (ref 0.0–0.1)
Basophils Relative: 0 %
Eosinophils Absolute: 0 10*3/uL (ref 0.0–0.5)
Eosinophils Relative: 0 %
HCT: 36.4 % (ref 36.0–46.0)
Hemoglobin: 11.3 g/dL — ABNORMAL LOW (ref 12.0–15.0)
Immature Granulocytes: 1 %
Lymphocytes Relative: 8 %
Lymphs Abs: 1.6 10*3/uL (ref 0.7–4.0)
MCH: 28.1 pg (ref 26.0–34.0)
MCHC: 31 g/dL (ref 30.0–36.0)
MCV: 90.5 fL (ref 80.0–100.0)
Monocytes Absolute: 1 10*3/uL (ref 0.1–1.0)
Monocytes Relative: 5 %
Neutro Abs: 17.1 10*3/uL — ABNORMAL HIGH (ref 1.7–7.7)
Neutrophils Relative %: 86 %
Platelets: 180 10*3/uL (ref 150–400)
RBC: 4.02 MIL/uL (ref 3.87–5.11)
RDW: 15.2 % (ref 11.5–15.5)
WBC: 19.9 10*3/uL — ABNORMAL HIGH (ref 4.0–10.5)
nRBC: 0 % (ref 0.0–0.2)

## 2018-07-27 LAB — BASIC METABOLIC PANEL
Anion gap: 9 (ref 5–15)
BUN: 17 mg/dL (ref 8–23)
CO2: 24 mmol/L (ref 22–32)
Calcium: 8.1 mg/dL — ABNORMAL LOW (ref 8.9–10.3)
Chloride: 106 mmol/L (ref 98–111)
Creatinine, Ser: 1.13 mg/dL — ABNORMAL HIGH (ref 0.44–1.00)
GFR calc Af Amer: 55 mL/min — ABNORMAL LOW (ref >60)
GFR calc non Af Amer: 48 mL/min — ABNORMAL LOW (ref >60)
Glucose, Bld: 159 mg/dL — ABNORMAL HIGH (ref 70–99)
Potassium: 4.5 mmol/L (ref 3.5–5.1)
Sodium: 139 mmol/L (ref 135–145)

## 2018-07-27 MED ORDER — OXYCODONE HCL 5 MG PO TABS: 5.0000 mg | ORAL_TABLET | ORAL | 0 refills | Status: DC | PRN

## 2018-07-27 MED ORDER — APIXABAN 2.5 MG PO TABS: 2.5000 mg | ORAL_TABLET | Freq: Two times a day (BID) | ORAL | 0 refills | Status: DC

## 2018-07-27 MED ORDER — TRAMADOL HCL 50 MG PO TABS: 50.0000 mg | ORAL_TABLET | Freq: Four times a day (QID) | ORAL | 0 refills | Status: DC | PRN

## 2018-07-27 NOTE — Discharge Instructions (Signed)
Diet: As you were doing prior to hospitalization  ° °Shower:  May shower but keep the wounds dry, use an occlusive plastic wrap, NO SOAKING IN TUB.  If the bandage gets wet, change with a clean dry gauze. ° °Dressing:  You may change your dressing as needed. Change the dressing with sterile gauze dressing.   ° °Activity:  Increase activity slowly as tolerated, but follow the weight bearing instructions below.  No lifting or driving for 6 weeks. ° °Weight Bearing:   Non-weightbearing to the left arm. ° °To prevent constipation: you may use a stool softener such as - ° °Colace (over the counter) 100 mg by mouth twice a day  °Drink plenty of fluids (prune juice may be helpful) and high fiber foods °Miralax (over the counter) for constipation as needed.   ° °Itching:  If you experience itching with your medications, try taking only a single pain pill, or even half a pain pill at a time.  You may take up to 10 pain pills per day, and you can also use benadryl over the counter for itching or also to help with sleep.  ° °Precautions:  If you experience chest pain or shortness of breath - call 911 immediately for transfer to the hospital emergency department!! ° °If you develop a fever greater that 101 F, purulent drainage from wound, increased redness or drainage from wound, or calf pain-Call [facility]                                              °Follow- Up Appointment:  Please call for an appointment to be seen in 2 weeks at [facility] °

## 2018-07-27 NOTE — Progress Notes (Signed)
Physical Therapy Treatment Patient Details Name: Kerri Carter MRN: [id number] DOB: 1940-11-01 Today's Date: 07/27/2018    History of Present Illness Patient is 78 year old female s/p Reverse left total shoulder arthroplasty. Previous R shoulder arthroplasty per pt.    PT Comments    Pt in bed.  Min assist for bed mobility to left to simulate home.  Stood and ambulated to bathroom them 110' in hallway with wide based quad cane and min guard/assist.  Generally steady.  While she makes quick movements at times, she has no LOB.  Returned to supine and positioned to comfort.   Follow Up Recommendations  Home health PT;Supervision for mobility/OOB     Equipment Recommendations  Other (comment)    Recommendations for Other Services       Precautions / Restrictions Precautions Precautions: Shoulder Shoulder Interventions: Shoulder sling/immobilizer;At all times;Off for dressing/bathing/exercises;Shoulder abduction pillow Required Braces or Orthoses: Other Brace Other Brace: shoulder sling/immobilizer Restrictions Weight Bearing Restrictions: Yes LUE Weight Bearing: Non weight bearing    Mobility  Bed Mobility Overal bed mobility: Needs Assistance Bed Mobility: Supine to Sit;Sit to Supine     Supine to sit: Min assist Sit to supine: Min assist   General bed mobility comments: On left side of bed to simulate home  Transfers Overall transfer level: Needs assistance Equipment used: Quad cane Transfers: Sit to/from Stand Sit to Stand: Min guard            Ambulation/Gait Ambulation/Gait assistance: Min guard;Min Web designer (Feet): 110 Feet   Gait Pattern/deviations: Step-to pattern Gait velocity: decreased   General Gait Details: generally steady with quad cane   Stairs             Wheelchair Mobility    Modified Rankin (Stroke Patients Only)       Balance Overall balance assessment: Needs assistance Sitting-balance support: Feet  supported Sitting balance-Leahy Scale: Fair     Standing balance support: Single extremity supported Standing balance-Leahy Scale: Fair                              Cognition Arousal/Alertness: Awake/alert Behavior During Therapy: WFL for tasks assessed/performed Overall Cognitive Status: Within Functional Limits for tasks assessed                                        Exercises Other Exercises Other Exercises: to bathroom to void.  pt wearing pull up brief from home per her request    General Comments        Pertinent Vitals/Pain Pain Assessment: Faces Faces Pain Scale: Hurts little more Pain Location: L shoulder Pain Descriptors / Indicators: Aching Pain Intervention(s): Premedicated before session;Limited activity within patient's tolerance;Monitored during session    Home Living                      Prior Function            PT Goals (current goals can now be found in the care plan section)      Frequency    BID      PT Plan Current plan remains appropriate    Co-evaluation              AM-PAC PT "6 Clicks" Mobility   Outcome Measure  Help needed turning from your back to your  side while in a flat bed without using bedrails?: A Little Help needed moving from lying on your back to sitting on the side of a flat bed without using bedrails?: A Little Help needed moving to and from a bed to a chair (including a wheelchair)?: A Little Help needed standing up from a chair using your arms (e.g., wheelchair or bedside chair)?: A Little Help needed to walk in hospital room?: A Little Help needed climbing 3-5 steps with a railing? : A Lot 6 Click Score: 17    End of Session Equipment Utilized During Treatment: Gait belt Activity Tolerance: Patient tolerated treatment well Patient left: in bed;with call bell/phone within reach;with bed alarm set         Time: 3494-9447 PT Time Calculation (min) (ACUTE ONLY):  14 min  Charges:  $Gait Training: 8-22 mins                    Chesley Noon, PTA 07/27/18, 3:53 PM

## 2018-07-27 NOTE — Evaluation (Signed)
Occupational Therapy Evaluation Patient Details Name: Kerri Carter MRN: [id number] DOB: 1940-04-26 Today's Date: 07/27/2018    History of Present Illness Patient is 78 year old female s/p Reverse left total shoulder arthroplasty. Previous R shoulder arthroplasty per pt.   Clinical Impression   Patient was seen for an OT evaluation this date. Pt lives with her spouse and has other family nearby to assist after discharge. Prior to surgery, pt was active and independent, endorsing 3 falls in past 12 months 2/2 LOB/tripping. Pt has orders for LUE to be immobilized and will be NWBing per MD. Patient presents with impaired strength/ROM and pain to LUE, impaired balance, and safety awareness. These impairments result in a decreased ability to perform self care tasks requiring min-mod assist for UB/LB dressing and bathing and max assist for application of polar care, compression stockings, and sling/immobilizer. Pt instructed in polar care mgt, compression stockings mgt, sling/immobilizer mgt, ROM exercises for LUE, LUE precautions, adaptive strategies for bathing/dressing/toileting/grooming, positioning and considerations for sleep, and home/routines modifications to maximize falls prevention, safety, and independence. Handout provided. Pt required verbal cues during session to redirect. OT adjusted sling/immobilizer and polar care to improve comfort, optimize positioning, and to maximize skin integrity/safety. Pt verbalized understanding of all education/training provided. Pt will benefit from skilled OT services to address these limitations and improve independence in daily tasks. Recommend HHOT services to continue therapy to maximize return to PLOF, address home/routines modifications and safety, minimize falls risk, and minimize caregiver burden. LCSW notified.    Follow Up Recommendations  Home health OT    Equipment Recommendations  None recommended by OT    Recommendations for Other Services        Precautions / Restrictions Precautions Precautions: Shoulder Shoulder Interventions: Shoulder sling/immobilizer;At all times;Off for dressing/bathing/exercises;Shoulder abduction pillow Precaution Booklet Issued: Yes (comment) Required Braces or Orthoses: Other Brace Other Brace: shoulder sling/immobilizer Restrictions Weight Bearing Restrictions: Yes LUE Weight Bearing: Non weight bearing      Mobility Bed Mobility Overal bed mobility: Needs Assistance Bed Mobility: Supine to Sit;Sit to Supine     Supine to sit: Supervision;HOB elevated Sit to supine: Supervision      Transfers Overall transfer level: Needs assistance Equipment used: None Transfers: Sit to/from Stand Sit to Stand: Min guard         General transfer comment: CGA for repeated STS transfers during dressing and toileting tasks    Balance Overall balance assessment: Needs assistance Sitting-balance support: No upper extremity supported;Feet supported Sitting balance-Leahy Scale: Fair     Standing balance support: No upper extremity supported;During functional activity Standing balance-Leahy Scale: Fair                             ADL either performed or assessed with clinical judgement   ADL Overall ADL's : Needs assistance/impaired Eating/Feeding: Modified independent   Grooming: Applying deodorant;Minimal assistance;Sitting Grooming Details (indicate cue type and reason): Min A for LUE underarm washing Upper Body Bathing: Minimal assistance;Moderate assistance;Sitting   Lower Body Bathing: Moderate assistance;Minimal assistance;Sitting/lateral leans   Upper Body Dressing : Sitting;Moderate assistance   Lower Body Dressing: Sitting/lateral leans;Moderate assistance;Minimal assistance   Toilet Transfer: Min Media planner and Hygiene: Min guard;Sit to/from stand               Vision Baseline Vision/History: Wears  glasses Wears Glasses: Reading only Patient Visual Report: No change from baseline  Perception     Praxis      Pertinent Vitals/Pain Pain Assessment: 0-10 Pain Score: 7  Pain Location: L shoulder Pain Descriptors / Indicators: Aching Pain Intervention(s): Limited activity within patient's tolerance;Monitored during session;Patient requesting pain meds-RN notified;Ice applied;Repositioned     Hand Dominance Right   Extremity/Trunk Assessment Upper Extremity Assessment Upper Extremity Assessment: LUE deficits/detail(RUE WFL) LUE Deficits / Details: immobilized, pt able to perform elbow/wrist/hand AROM, denies sensory deficits LUE Sensation: WNL   Lower Extremity Assessment Lower Extremity Assessment: Generalized weakness;Overall [facility] for tasks assessed   Cervical / Trunk Assessment Cervical / Trunk Assessment: Normal   Communication Communication Communication: No difficulties   Cognition Arousal/Alertness: Awake/alert Behavior During Therapy: WFL for tasks assessed/performed Overall Cognitive Status: Within Functional Limits for tasks assessed                                 General Comments: very verbose, requires cues to redirect   General Comments  shoulder sling/immobilizer and polar care adjusted for optimal skin/joint protection and comfort    Exercises Other Exercises Other Exercises: pt instructed in ROM precautions and ex, ADL strategies and modifications, falls prevention strategies, sling mgt, polar care mgt, and compression stocking mgt; handout provided   Shoulder Instructions      Home Living Family/patient expects to be discharged to:: Private residence Living Arrangements: Spouse/significant other Available Help at Discharge: Family Type of Home: House Home Access: Ramped entrance     [place]: One level         Bathroom Toilet: Handicapped height     [place]: Shower seat          Prior  Functioning/Environment Level of Independence: Independent        Comments: indep with amb, ADL, 3 falls in past 12 months        OT Problem List: Decreased strength;Pain;Decreased range of motion;Decreased safety awareness;Impaired balance (sitting and/or standing);Decreased knowledge of use of DME or AE;Decreased knowledge of precautions;Impaired UE functional use      OT Treatment/Interventions: Self-care/ADL training;Therapeutic exercise;Therapeutic activities;DME and/or AE instruction;Patient/family education;Balance training    OT Goals(Current goals can be found in the care plan section) Acute Rehab OT Goals Patient Stated Goal: to go home with family and recover OT Goal Formulation: With patient Time For Goal Achievement: 08/10/18 Potential to Achieve Goals: Good ADL Goals Pt Will Perform Lower Body Dressing: with min guard assist;sit to/from stand Pt Will Transfer to Toilet: with supervision;ambulating;regular height toilet Additional ADL Goal #1: Pt will independently instruct family/caregiver in compression stocking mgt. Additional ADL Goal #2: Pt will independently instruct family/caregiver in polar care mgt. Additional ADL Goal #3: Pt will independently instruct family/caregiver in shoulder sling/immobilizer.  OT Frequency: Min 1X/week   Barriers to D/C:            Co-evaluation              AM-PAC OT "6 Clicks" Daily Activity     Outcome Measure Help from another person eating meals?: None Help from another person taking care of personal grooming?: A Little Help from another person toileting, which includes using toliet, bedpan, or urinal?: A Little Help from another person bathing (including washing, rinsing, drying)?: A Little Help from another person to put on and taking off regular upper body clothing?: A Lot Help from another person to put on and taking off regular lower body clothing?: A Little 6 Click Score:  18   End of Session Equipment Utilized  During Treatment: Gait belt;Oxygen Nurse Communication: Patient requests pain meds  Activity Tolerance: Patient tolerated treatment well Patient left: in bed;with call bell/phone within reach;with bed alarm set;with SCD's reapplied;Other (comment)(sling and polar care in place)  OT Visit Diagnosis: Other abnormalities of gait and mobility (R26.89);Repeated falls (R29.6);Pain Pain - Right/Left: Left Pain - part of body: Shoulder                Time: 7290-2111 OT Time Calculation (min): 44 min Charges:  OT General Charges $OT Visit: 1 Visit OT Evaluation $OT Eval Moderate Complexity: 1 Mod OT Treatments $Self Care/Home Management : 23-37 mins  Jeni Salles, MPH, MS, OTR/L ascom [phone] 07/27/18, 10:11 AM

## 2018-07-27 NOTE — Progress Notes (Signed)
Subjective: 1 Day Post-Op Procedure(s) (LRB): REVERSE SHOULDER ARTHROPLASTY (Left) Patient reports pain as mild this morning but reports moderate pain last night.   Patient is well, but has had some minor complaints of pain PT and care management to assist with discharge planning. Negative for chest pain and shortness of breath Fever: no Gastrointestinal:Negative for nausea and vomiting  Objective: Vital signs in last 24 hours: Temp:  [97.4 F (36.3 C)-98.2 F (36.8 C)] 97.4 F (36.3 C) (06/24 0732) Pulse Rate:  [73-79] 79 (06/24 0732) Resp:  [10-19] 19 (06/24 0406) BP: (126-167)/(55-91) 135/65 (06/24 0732) SpO2:  [94 %-100 %] 96 % (06/24 0732)  Intake/Output from previous day:  Intake/Output Summary (Last 24 hours) at 07/27/2018 0751 Last data filed at 07/27/2018 0255 Gross per 24 hour  Intake 1145.35 ml  Output 1150 ml  Net -4.65 ml    Intake/Output this shift: No intake/output data recorded.  Labs: Recent Labs    07/27/18 0227  HGB 11.3*   Recent Labs    07/27/18 0227  WBC 19.9*  RBC 4.02  HCT 36.4  PLT 180   Recent Labs    07/27/18 0227  NA 139  K 4.5  CL 106  CO2 24  BUN 17  CREATININE 1.13*  GLUCOSE 159*  CALCIUM 8.1*   No results for input(s): LABPT, INR in the last 72 hours.   EXAM General - Patient is Alert, Appropriate and Oriented Extremity - ABD soft Sensation intact distally Dorsiflexion/Plantar flexion intact Incision: dressing C/D/I No cellulitis present  Patient is intact to light touch to the left arm this AM. Dressing/Incision - clean, dry, no drainage Motor Function - intact, moving foot and toes well on exam.   Past Medical History:  Diagnosis Date  . Anxiety   . Asthma   . Bipolar disorder ([place])   . Cataract    s/p b/l repair   . Chicken pox   . CKD (chronic kidney disease)   . CKD (chronic kidney disease), stage III ([place])    a. s/p R nephrectomy.  . Conversion disorder   . COPD (chronic obstructive pulmonary  disease) ([place])   . Depression   . Essential hypertension   . GERD (gastroesophageal reflux disease)   . Hyperlipidemia   . Inflammatory arthritis    a. hands/carpal tunnel.  b. Low titer rheumatoid factor. c. Negative anti-CCP antibodies. d. Plaquenil.  . Non-Obstructive CAD    a. 07/2009 Cath ([facility]): nonobs dzs;  b. 03/2011 Cath [facility]): nonobs dzs.  . Osteoarthritis    a. Knees.  . PUD (peptic ulcer disease)   . S/P right hip fracture    11/01/16 s/p repair  . Shoulder pain   . Sleep apnea    no cpap  . Spinal stenosis at L4-L5 level    severe with L4/L5 anterolisthesis grade 1 anterolisthesis   . Toxic maculopathy   . Valvular heart disease    a. 07/2015 Echo: EF 55-60%, Mild AI, AS, MR, and TR.    Assessment/Plan: 1 Day Post-Op Procedure(s) (LRB): REVERSE SHOULDER ARTHROPLASTY (Left) Active Problems:   Status post reverse arthroplasty of shoulder, left  Estimated body mass index is 32.16 kg/m as calculated from the following:   Height as of this encounter: 5' 1.5" (1.562 m).   Weight as of this encounter: 78.5 kg. Advance diet Up with therapy D/C IV fluids when tolerating po intake.  Labs reviewed this AM, WBC 19.9, denies any chest pain or SOB.  No recent fevers. Up with  therapy today.  PT and Care management to assist with discharge planning. Possible discharge today pending progress with PT. Will plan for discharge on Eliquis for DVT prophylaxis.  DVT Prophylaxis - Lovenox, Foot Pumps and TED hose Non-weightbearing to the left arm.  Raquel James, PA-C [facility] 07/27/2018, 7:51 AM

## 2018-07-27 NOTE — Progress Notes (Signed)
OT Cancellation Note  Patient Details Name: Kerri Carter MRN: [id number] DOB: 1941-02-02   Cancelled Treatment:    Reason Eval/Treat Not Completed: Other (comment). Pt eating breakfast, will re-attempt later this morning.   Jeni Salles, MPH, MS, OTR/L ascom [phone] 07/27/18, 8:35 AM

## 2018-07-27 NOTE — Progress Notes (Addendum)
Physical Therapy Treatment Patient Details Name: Kerri Carter MRN: [id number] DOB: 1940-07-02 Today's Date: 07/27/2018    History of Present Illness Patient is 78 year old female s/p Reverse left total shoulder arthroplasty. Previous R shoulder arthroplasty per pt.    PT Comments    Pt in bed, ready to walk.  To edge of bed with rail and increased time but no physical assist except to unhook polar care.  Steady in sitting with ex as below.  Stood with min guard to wide base quad cane and and was able to walk 110' with slow but steady gait.  Requires +1 assist for safety but overall does well.  Remained in recliner after session. PT states she has a ramp at home and does not do any stairs.  Pt on 2 lpm 95%.  O2 removed for session as pt stated she does not wear O2 at home.  Sats remained 95% after gait.  O2 left off and RN notified.  Discussed with pt, she has assist from family at home.  Will update discharge recommendations to reflect improved mobility skills.    Follow Up Recommendations  Home health PT;Supervision for mobility/OOB     Equipment Recommendations       Recommendations for Other Services       Precautions / Restrictions Precautions Precautions: Shoulder Shoulder Interventions: Shoulder sling/immobilizer;At all times;Off for dressing/bathing/exercises;Shoulder abduction pillow Precaution Booklet Issued: Yes (comment) Required Braces or Orthoses: Other Brace Other Brace: shoulder sling/immobilizer Restrictions Weight Bearing Restrictions: Yes LUE Weight Bearing: Non weight bearing    Mobility  Bed Mobility Overal bed mobility: Needs Assistance Bed Mobility: Supine to Sit     Supine to sit: Supervision;HOB elevated Sit to supine: Supervision      Transfers Overall transfer level: Needs assistance Equipment used: None Transfers: Sit to/from Stand Sit to Stand: Min guard         General transfer comment: CGA for repeated STS transfers during  dressing and toileting tasks  Ambulation/Gait Ambulation/Gait assistance: Min guard Gait Distance (Feet): 110 Feet Assistive device: Quad cane Gait Pattern/deviations: Step-to pattern Gait velocity: decreased   General Gait Details: generally steady with quad cane   Stairs             Wheelchair Mobility    Modified Rankin (Stroke Patients Only)       Balance Overall balance assessment: Needs assistance Sitting-balance support: No upper extremity supported;Feet supported Sitting balance-Leahy Scale: Fair     Standing balance support: Single extremity supported Standing balance-Leahy Scale: Fair                              Cognition Arousal/Alertness: Awake/alert Behavior During Therapy: WFL for tasks assessed/performed Overall Cognitive Status: Within Functional Limits for tasks assessed                                 General Comments: very verbose, requires cues to redirect      Exercises Other Exercises Other Exercises: unsupported sitting ankle pumps, LAQ and marches x 10    General Comments General comments (skin integrity, edema, etc.): shoulder sling/immobilizer and polar care adjusted for optimal skin/joint protection and comfort      Pertinent Vitals/Pain Pain Assessment: 0-10 Pain Score: 5  Pain Location: L shoulder Pain Descriptors / Indicators: Aching Pain Intervention(s): Premedicated before session;Patient requesting pain meds-RN notified    Home  Living Family/patient expects to be discharged to:: Private residence Living Arrangements: Spouse/significant other Available Help at Discharge: Family Type of Home: House Home Access: [place]: One [place]: Shower seat      Prior Function Level of Independence: Independent      Comments: indep with amb, ADL, 3 falls in past 12 months   PT Goals (current goals can now be found in the care plan section) Acute Rehab PT  Goals Patient Stated Goal: to go home with family and recover Progress towards PT goals: Progressing toward goals    Frequency    BID      PT Plan Discharge plan needs to be updated    Co-evaluation              AM-PAC PT "6 Clicks" Mobility   Outcome Measure  Help needed turning from your back to your side while in a flat bed without using bedrails?: A Little Help needed moving from lying on your back to sitting on the side of a flat bed without using bedrails?: A Little Help needed moving to and from a bed to a chair (including a wheelchair)?: A Little Help needed standing up from a chair using your arms (e.g., wheelchair or bedside chair)?: A Little Help needed to walk in hospital room?: A Little Help needed climbing 3-5 steps with a railing? : A Lot 6 Click Score: 17    End of Session Equipment Utilized During Treatment: Gait belt Activity Tolerance: Patient tolerated treatment well Patient left: in chair;with call bell/phone within reach;with chair alarm set Nurse Communication: Other (comment)       Time: 0045-9977 PT Time Calculation (min) (ACUTE ONLY): 23 min  Charges:  $Gait Training: 8-22 mins $Therapeutic Exercise: 8-22 mins                     Chesley Noon, PTA 07/27/18, 11:03 AM

## 2018-07-27 NOTE — TOC Initial Note (Signed)
Transition of Care [facility]) - Initial/Assessment Note    Patient Details  Name: Kerri Carter MRN: [id number] Date of Birth: 1941-01-19  [facility]) CM/SW Contact:    Erykah Lippert, Lenice Llamas Phone Number: [phone]  07/27/2018, 11:54 AM  Clinical Narrative: PT recommended SNF on post op day 0 however per PT patient did much better today and recommendation has been changed to home health. Patient's surgeon's office arranged home health with [facility] prior to hospital stay. Patient is agreeable to [facility] and requested the same PT and OT she had with [facility] last time. Per patient her address is [address], [place], [place] [address]. Helene Kelp [facility] representative is aware of above. Per patient she has 2 canes and 2 walkers at home and has no DME needs. Per patient she lives with her husband [name] who will provide support. CSW will continue to follow and assist as needed.         Expected Discharge Plan: [place] Barriers to Discharge: Continued Medical Work up   Patient Goals and CMS Choice Patient states their goals for this hospitalization and ongoing recovery are:: Pain control. CMS Medicare.gov Compare Post Acute Care list provided to:: (Patient's surgeon's office arranged home health with [facility] prior to hospital admission.) Choice offered to / list presented to : (Patient's surgeon's office arranged home health with [facility] prior to hospital admission.)  Expected Discharge Plan and Services Expected Discharge Plan: [place] In-house Referral: Clinical Social Work Discharge Planning Services: CM Consult   Living arrangements for the past 2 months: Single Family Home                           HH Arranged: PT, OT [place] Agency: [facility] [employer] Date [place]: 07/27/18   Representative spoke with at [place]  Prior Living Arrangements/Services Living arrangements  for the past 2 months: [place] with:: Spouse Patient language and need for interpreter reviewed:: No Do you feel safe going back to the place where you live?: Yes      Need for Family Participation in Patient Care: Yes (Comment) Care giver support system in place?: Yes (comment)   Criminal Activity/Legal Involvement Pertinent to Current Situation/Hospitalization: No - Comment as needed  Activities of Daily Living Home Assistive Devices/Equipment: None ADL Screening (condition at time of admission) Patient's cognitive ability adequate to safely complete daily activities?: Yes Is the patient deaf or have difficulty hearing?: No Does the patient have difficulty seeing, even when wearing glasses/contacts?: No Does the patient have difficulty concentrating, remembering, or making decisions?: Yes Patient able to express need for assistance with ADLs?: Yes Does the patient have difficulty dressing or bathing?: No Independently performs ADLs?: Yes (appropriate for developmental age) Does the patient have difficulty walking or climbing stairs?: Yes Weakness of Legs: Both Weakness of Arms/Hands: Both  Permission Sought/Granted Permission sought to share information with : [facility] agency) Permission granted to share information with : Yes, Verbal Permission Granted              Emotional Assessment Appearance:: Appears stated age   Affect (typically observed): Calm, Pleasant Orientation: : Oriented to Self, Oriented to Place, Oriented to  Time, Oriented to Situation Alcohol / Substance Use: Not Applicable Psych Involvement: No (comment)  Admission diagnosis:  Rotator cuff tendinitis, left Patient Active Problem List   Diagnosis Date  Noted  . Status post reverse arthroplasty of shoulder, left 07/26/2018  . Small airways disease 07/07/2018  . Fatigue 07/07/2018  . Avascular necrosis of left shoulder due to adverse effect of steroid therapy ([place]) 01/20/2018  .  Status post reverse total shoulder replacement, right 11/04/2017  . Leukocytosis 10/19/2017  . Allergic rhinitis 09/28/2017  . Rotator cuff tendinitis, right 07/26/2017  . Strain of right hip 02/26/2017  . History of fracture of left hip 01/15/2017  . Status post lumbar spinal fusion 01/15/2017  . Dislocation of hip joint prosthesis ([place]) 01/08/2017  . Status post hip hemiarthroplasty 01/08/2017  . Leg swelling 12/15/2016  . Hip fracture ([place]) 10/31/2016  . Polyneuropathy 10/28/2016  . Left foot pain 10/19/2016  . Spinal stenosis of lumbar region 09/15/2016  . Osteoporosis 08/27/2016  . Mixed bipolar I disorder ([place]) 10/09/2015  . CKD (chronic kidney disease), stage III ([place])   . Essential hypertension   . Conversion disorder 08/04/2015  . Hyperlipidemia 07/17/2015  . 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 Macular degeneration 07/17/2015  . Insomnia 07/17/2015  . Rheumatoid arthritis ([place]) 12/05/2014  . Cough 05/02/2012  . Valvular heart disease 10/13/2011  . COPD (chronic obstructive pulmonary disease) ([place]) 09/09/2011  . OSA (obstructive sleep apnea) 09/09/2011  . Nocturnal hypoxemia 09/09/2011  . Gastroesophageal reflux disease 02/24/2011  . Single kidney 02/24/2011   PCP:  McLean-Scocuzza, Nino Glow, MD Pharmacy:   [id number] - [place], [place] [address] [place] [address] Phone: [phone] Fax: [phone]     Social Determinants of Health (SDOH) Interventions    Readmission Risk Interventions No flowsheet data found.

## 2018-07-28 ENCOUNTER — Encounter: Payer: Self-pay | Admitting: Surgery

## 2018-07-28 LAB — BASIC METABOLIC PANEL
Anion gap: 7 (ref 5–15)
BUN: 20 mg/dL (ref 8–23)
CO2: 27 mmol/L (ref 22–32)
Calcium: 8 mg/dL — ABNORMAL LOW (ref 8.9–10.3)
Chloride: 104 mmol/L (ref 98–111)
Creatinine, Ser: 1.14 mg/dL — ABNORMAL HIGH (ref 0.44–1.00)
GFR calc Af Amer: 54 mL/min — ABNORMAL LOW (ref >60)
GFR calc non Af Amer: 47 mL/min — ABNORMAL LOW (ref >60)
Glucose, Bld: 139 mg/dL — ABNORMAL HIGH (ref 70–99)
Potassium: 4.2 mmol/L (ref 3.5–5.1)
Sodium: 138 mmol/L (ref 135–145)

## 2018-07-28 LAB — SURGICAL PATHOLOGY

## 2018-07-28 NOTE — TOC Transition Note (Signed)
Transition of Care [facility]) - CM/SW Discharge Note   Patient Details  Name: Kerri Carter MRN: [id number] Date of Birth: 08-09-1940  [facility]) CM/SW Contact:  Sani Loiseau, Lenice Llamas Phone Number: [phone]  07/28/2018, 8:19 AM   Clinical Narrative: Clinical Social Worker (CSW) notified Helene Kelp [facility] home health representative that patient will D/C home today. Patient has no DME needs. Please reconsult if future social work needs arise. CSW signing off.     Final next level of care: [place] Barriers to Discharge: Barriers Resolved   Patient Goals and CMS Choice Patient states their goals for this hospitalization and ongoing recovery are:: Pain control CMS Medicare.gov Compare Post Acute Care list provided to:: (Patient's surgeon's office arranged home health with [facility] prior to hospital stay.) Choice offered to / list presented to : (Patient's surgeon's office arranged home health with [facility] prior to hospital stay.)  Discharge Placement                       Discharge Plan and Services In-house Referral: Clinical Social Work Discharge Planning Services: CM Consult                      HH Arranged: PT, OT, Refused SNF [place] [facility]: [facility] [employer] Date [place]: 07/28/18 Time [place]: [phone] Representative spoke with at [place]: [place] ([place]) Interventions     Readmission Risk Interventions No flowsheet data found.

## 2018-07-28 NOTE — Progress Notes (Signed)
Physical Therapy Treatment Patient Details Name: Kerri Carter MRN: [id number] DOB: [birth year] Today's Date: 07/28/2018    History of Present Illness Patient is 78 year old female s/p Reverse left total shoulder arthroplasty. Previous R shoulder arthroplasty per pt.    PT Comments    Pt reports overall increased pain today and poor sleep last night due to pain.  She is able to stand and ambulate into hallway and back but declines further gait at this time due to pain.  Gait remains steady with quad cane.  Plan is discharge home today. Pt voices no further questions or concerns.   Follow Up Recommendations  Home health PT;Supervision for mobility/OOB     Equipment Recommendations  Other (comment)    Recommendations for Other Services       Precautions / Restrictions Precautions Precautions: Shoulder Shoulder Interventions: Shoulder sling/immobilizer;At all times;Off for dressing/bathing/exercises;Shoulder abduction pillow Required Braces or Orthoses: Other Brace Other Brace: shoulder sling/immobilizer Restrictions Weight Bearing Restrictions: Yes LUE Weight Bearing: Non weight bearing    Mobility  Bed Mobility               General bed mobility comments: in recliner  Transfers Overall transfer level: Needs assistance Equipment used: Quad cane Transfers: Sit to/from Stand Sit to Stand: Min guard            Ambulation/Gait Ambulation/Gait assistance: Min guard;Min Web designer (Feet): 50 Feet Assistive device: Quad cane Gait Pattern/deviations: Step-to pattern Gait velocity: decreased   General Gait Details: generally steady with quad cane   Stairs             Wheelchair Mobility    Modified Rankin (Stroke Patients Only)       Balance Overall balance assessment: Needs assistance Sitting-balance support: Feet supported Sitting balance-Leahy Scale: Fair     Standing balance support: Single extremity supported Standing  balance-Leahy Scale: Fair                              Cognition Arousal/Alertness: Awake/alert Behavior During Therapy: WFL for tasks assessed/performed Overall Cognitive Status: Within Functional Limits for tasks assessed                                        Exercises      General Comments        Pertinent Vitals/Pain Pain Assessment: 0-10 Pain Score: 8  Pain Location: L shoulder =- reports increased pain last night and today not controlled by medications Pain Descriptors / Indicators: Aching Pain Intervention(s): Monitored during session;Premedicated before session;Repositioned;Ice applied;Limited activity within patient's tolerance    Home Living                      Prior Function            PT Goals (current goals can now be found in the care plan section) Progress towards PT goals: Progressing toward goals    Frequency    BID      PT Plan Current plan remains appropriate    Co-evaluation              AM-PAC PT "6 Clicks" Mobility   Outcome Measure  Help needed turning from your back to your side while in a flat bed without using bedrails?: A Little Help needed moving from lying on your  back to sitting on the side of a flat bed without using bedrails?: A Little Help needed moving to and from a bed to a chair (including a wheelchair)?: A Little Help needed standing up from a chair using your arms (e.g., wheelchair or bedside chair)?: A Little Help needed to walk in hospital room?: A Little Help needed climbing 3-5 steps with a railing? : A Lot 6 Click Score: 17    End of Session Equipment Utilized During Treatment: Gait belt Activity Tolerance: Patient tolerated treatment well Patient left: in chair;with call bell/phone within reach;with chair alarm set Nurse Communication: Other (comment)       Time: 2263-3354 PT Time Calculation (min) (ACUTE ONLY): 9 min  Charges:  $Gait Training: 8-22 mins                      Chesley Noon, PTA 07/28/18, 10:36 AM

## 2018-07-28 NOTE — Progress Notes (Signed)
OT Cancellation Note  Patient Details Name: Kerri Carter MRN: [id number] DOB: 1940/02/21   Cancelled Treatment:    Reason Eval/Treat Not Completed: Other (comment). Pt in bathroom with nursing preparing for discharge. Will re-attempt as able.   Jeni Salles, MPH, MS, OTR/L ascom [phone] 07/28/18, 11:30 AM

## 2018-07-28 NOTE — Progress Notes (Signed)
Subjective: 2 Days Post-Op Procedure(s) (LRB): REVERSE SHOULDER ARTHROPLASTY (Left) Patient reports pain as mild this morning, still occasional moderate pain but has improved compared to yesterday. Patient is well and states she is ready to go home. Plan for discharge home today with HHPT. Negative for chest pain and shortness of breath Fever: no Gastrointestinal:Negative for nausea and vomiting  Objective: Vital signs in last 24 hours: Temp:  [97.4 F (36.3 C)-98.1 F (36.7 C)] 98.1 F (36.7 C) (06/24 2315) Pulse Rate:  [79-80] 80 (06/24 2315) Resp:  [18] 18 (06/24 2315) BP: (131-135)/(46-65) 132/61 (06/24 2315) SpO2:  [93 %-96 %] 93 % (06/24 2315)  Intake/Output from previous day:  Intake/Output Summary (Last 24 hours) at 07/28/2018 0729 Last data filed at 07/27/2018 1900 Gross per 24 hour  Intake 980 ml  Output 1300 ml  Net -320 ml    Intake/Output this shift: No intake/output data recorded.  Labs: Recent Labs    07/27/18 0227  HGB 11.3*   Recent Labs    07/27/18 0227  WBC 19.9*  RBC 4.02  HCT 36.4  PLT 180   Recent Labs    07/27/18 0227 07/28/18 0324  NA 139 138  K 4.5 4.2  CL 106 104  CO2 24 27  BUN 17 20  CREATININE 1.13* 1.14*  GLUCOSE 159* 139*  CALCIUM 8.1* 8.0*   No results for input(s): LABPT, INR in the last 72 hours.   EXAM General - Patient is Alert, Appropriate and Oriented Extremity - ABD soft Sensation intact distally Dorsiflexion/Plantar flexion intact Incision: dressing C/D/I No cellulitis present  Patient is intact to light touch to the left arm this AM. Dressing/Incision - clean, dry, no drainage Motor Function - intact, moving foot and toes well on exam.   Past Medical History:  Diagnosis Date  . Anxiety   . Asthma   . Bipolar disorder ([place])   . Cataract    s/p b/l repair   . Chicken pox   . CKD (chronic kidney disease)   . CKD (chronic kidney disease), stage III ([place])    a. s/p R nephrectomy.  . Conversion  disorder   . COPD (chronic obstructive pulmonary disease) ([place])   . Depression   . Essential hypertension   . GERD (gastroesophageal reflux disease)   . Hyperlipidemia   . Inflammatory arthritis    a. hands/carpal tunnel.  b. Low titer rheumatoid factor. c. Negative anti-CCP antibodies. d. Plaquenil.  . Non-Obstructive CAD    a. 07/2009 Cath ([facility]): nonobs dzs;  b. 03/2011 Cath [facility]): nonobs dzs.  . Osteoarthritis    a. Knees.  . PUD (peptic ulcer disease)   . S/P right hip fracture    11/01/16 s/p repair  . Shoulder pain   . Sleep apnea    no cpap  . Spinal stenosis at L4-L5 level    severe with L4/L5 anterolisthesis grade 1 anterolisthesis   . Toxic maculopathy   . Valvular heart disease    a. 07/2015 Echo: EF 55-60%, Mild AI, AS, MR, and TR.    Assessment/Plan: 2 Days Post-Op Procedure(s) (LRB): REVERSE SHOULDER ARTHROPLASTY (Left) Active Problems:   Status post reverse arthroplasty of shoulder, left  Estimated body mass index is 32.16 kg/m as calculated from the following:   Height as of this encounter: 5' 1.5" (1.562 m).   Weight as of this encounter: 78.5 kg. Up with therapy Discharge home with home health  No recent fevers.  She is passing gas without pain. Up with  therapy today, will plan for discharge after morning session of therapy. Discharge home with [place]. Will plan for discharge on Eliquis for DVT prophylaxis.  DVT Prophylaxis - Lovenox, Foot Pumps and TED hose Non-weightbearing to the left arm.  Raquel Linsie Lupo, PA-C [place] [facility] 07/28/2018, 7:29 AM

## 2018-07-28 NOTE — Discharge Summary (Signed)
Physician Discharge Summary  Patient ID: Kerri Carter MRN: [id number] DOB/AGE: 11-Jun-1940 78 y.o.  Admit date: 07/26/2018 Discharge date: 07/28/2018  Admission Diagnoses:  Rotator cuff tendinitis, left Avascular necrosis of left humeral head  Discharge Diagnoses: Patient Active Problem List   Diagnosis Date Noted  . Status post reverse arthroplasty of shoulder, left 07/26/2018  . Small airways disease 07/07/2018  . Fatigue 07/07/2018  . Avascular necrosis of left shoulder due to adverse effect of steroid therapy ([place]) 01/20/2018  . Status post reverse total shoulder replacement, right 11/04/2017  . Leukocytosis 10/19/2017  . Allergic rhinitis 09/28/2017  . Rotator cuff tendinitis, right 07/26/2017  . Strain of right hip 02/26/2017  . History of fracture of left hip 01/15/2017  . Status post lumbar spinal fusion 01/15/2017  . Dislocation of hip joint prosthesis ([place]) 01/08/2017  . Status post hip hemiarthroplasty 01/08/2017  . Leg swelling 12/15/2016  . Hip fracture ([place]) 10/31/2016  . Polyneuropathy 10/28/2016  . Left foot pain 10/19/2016  . Spinal stenosis of lumbar region 09/15/2016  . Osteoporosis 08/27/2016  . Mixed bipolar I disorder ([place]) 10/09/2015  . CKD (chronic kidney disease), stage III ([place])   . Essential hypertension   . Conversion disorder 08/04/2015  . Hyperlipidemia 07/17/2015  . 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 Macular degeneration 07/17/2015  . Insomnia 07/17/2015  . Rheumatoid arthritis ([place]) 12/05/2014  . Cough 05/02/2012  . Valvular heart disease 10/13/2011  . COPD (chronic obstructive pulmonary disease) ([place]) 09/09/2011  . OSA (obstructive sleep apnea) 09/09/2011  . Nocturnal hypoxemia 09/09/2011  . Gastroesophageal reflux disease 02/24/2011  . Single kidney 02/24/2011    Past Medical History:  Diagnosis Date  . Anxiety   . Asthma   . Bipolar disorder ([place])   . Cataract    s/p b/l repair   . Chicken pox    . CKD (chronic kidney disease)   . CKD (chronic kidney disease), stage III ([place])    a. s/p R nephrectomy.  . Conversion disorder   . COPD (chronic obstructive pulmonary disease) ([place])   . Depression   . Essential hypertension   . GERD (gastroesophageal reflux disease)   . Hyperlipidemia   . Inflammatory arthritis    a. hands/carpal tunnel.  b. Low titer rheumatoid factor. c. Negative anti-CCP antibodies. d. Plaquenil.  . Non-Obstructive CAD    a. 07/2009 Cath ([facility]): nonobs dzs;  b. 03/2011 Cath [facility]): nonobs dzs.  . Osteoarthritis    a. Knees.  . PUD (peptic ulcer disease)   . S/P right hip fracture    11/01/16 s/p repair  . Shoulder pain   . Sleep apnea    no cpap  . Spinal stenosis at L4-L5 level    severe with L4/L5 anterolisthesis grade 1 anterolisthesis   . Toxic maculopathy   . Valvular heart disease    a. 07/2015 Echo: EF 55-60%, Mild AI, AS, MR, and TR.     Transfusion: None.   Consultants (if any):   Discharged Condition: Improved  Hospital Course: Kerri Carter is an 78 y.o. female who was admitted 07/26/2018 with a diagnosis of avascular necrosis of humeral head with rotator cuff tendinopathy of the left shoulder and went to the operating room on 07/26/2018 and underwent the above named procedures.    Surgeries: Procedure(s): REVERSE SHOULDER ARTHROPLASTY on 07/26/2018 Patient tolerated the surgery well. Taken to PACU where she was stabilized and then transferred to the orthopedic floor.  Started on Lovenox 40mg  q  24 hrs on POD1 however patient refused initial injection. Foot pumps applied bilaterally at 80 mm. Heels elevated on bed with rolled towels. No evidence of DVT. Negative [place]. Physical therapy started on day #1 for gait training and transfer. OT started day #1 for ADL and assisted devices.  Patient's IV was removed on POD2.  Implants: All press-fit [employer] Comprehensive system with a #10 micro-humeral stem, a +6 mm offset 40 mm humeral tray with a  standard E-polyethylene insert, and a mini-base plate with a 36 mm standard glenosphere.  She was given perioperative antibiotics:  Anti-infectives (From admission, onward)   Start     Dose/Rate Route Frequency Ordered Stop   07/26/18 1930  vancomycin (VANCOCIN) IVPB 1000 mg/200 mL premix     1,000 mg 200 mL/hr over 60 Minutes Intravenous Every 12 hours 07/26/18 1110 07/26/18 1959   07/26/18 0600  vancomycin (VANCOCIN) IVPB 1000 mg/200 mL premix     1,000 mg 200 mL/hr over 60 Minutes Intravenous  Once 07/26/18 0559 07/26/18 0824    .  She was given sequential compression devices, early ambulation, and Lovenox for DVT prophylaxis.  She benefited maximally from the hospital stay and there were no complications.    Recent vital signs:  Vitals:   07/27/18 1310 07/27/18 2315  BP: (!) 131/46 132/61  Pulse: 79 80  Resp:  18  Temp: 98 F (36.7 C) 98.1 F (36.7 C)  SpO2: 95% 93%    Recent laboratory studies:  Lab Results  Component Value Date   HGB 11.3 (L) 07/27/2018   HGB 12.4 04/14/2018   HGB 12.9 01/21/2018   Lab Results  Component Value Date   WBC 19.9 (H) 07/27/2018   PLT 180 07/27/2018   Lab Results  Component Value Date   INR 0.90 10/21/2017   Lab Results  Component Value Date   NA 138 07/28/2018   K 4.2 07/28/2018   CL 104 07/28/2018   CO2 27 07/28/2018   BUN 20 07/28/2018   CREATININE 1.14 (H) 07/28/2018   GLUCOSE 139 (H) 07/28/2018    Discharge Medications:   Allergies as of 07/28/2018      Reactions   Ceftin [cefuroxime Axetil] Anaphylaxis   Lisinopril Anaphylaxis   Morphine Other (See Comments)   Per patient, low blood pressure issues that requires action to raise it back up. Can take small infrequent doses   Sulfasalazine Anaphylaxis   Aspirin Other (See Comments)   Sulfasalazine allergy cross reacts   Antihistamines, Chlorpheniramine-type Other (See Comments)   Makes pt hyper   Antivert [meclizine Hcl] Other (See Comments)   Bladder will not  empty   Decongestant [pseudoephedrine Hcl] Other (See Comments)   Makes pt hyper   Doxycycline Other (See Comments)   GI upset   Polymyxin B Other (See Comments)   Medication was in eye drops.   Sulfa Antibiotics Other (See Comments)   Face swelling   Xarelto [rivaroxaban] Other (See Comments)   Stomach burning, bleeding, and tar in stool   Adhesive [tape] Rash   Iodine Hives, Rash   Per patient allergy is to contrast dye only, she is able to use betadine scrubs.   Levaquin [levofloxacin In D5w] Rash   Tetanus Toxoids Rash, Other (See Comments)   Fever and hot to touch at injection site      Medication List    TAKE these medications   albuterol 108 (90 Base) MCG/ACT inhaler Commonly known as: Ventolin HFA Inhale 2 puffs into the lungs every  6 (six) hours as needed. What changed: reasons to take this   ALPRAZolam 0.25 MG tablet Commonly known as: XANAX Take 1 tablet (0.25 mg total) by mouth at bedtime as needed for anxiety. What changed: when to take this   apixaban 2.5 MG Tabs tablet Commonly known as: Eliquis Take 1 tablet (2.5 mg total) by mouth 2 (two) times daily.   benzonatate 200 MG capsule Commonly known as: TESSALON Take 1 capsule (200 mg total) by mouth 3 (three) times daily as needed for cough.   Bystolic 10 MG tablet Generic drug: nebivolol TAKE 1 TABLET BY MOUTH EVERY DAY What changed: how much to take   clotrimazole 1 % cream Commonly known as: LOTRIMIN Apply 1 application topically 2 (two) times daily as needed (for irritation).   escitalopram 10 MG tablet Commonly known as: LEXAPRO Take 1 tablet (10 mg total) by mouth daily.   fluticasone furoate-vilanterol 200-25 MCG/INH Aepb Commonly known as: Breo Ellipta Inhale 1 puff into the lungs daily. Rinse mouth   furosemide 20 MG tablet Commonly known as: LASIX Take 1 tablet (20 mg total) by mouth daily as needed. Take x  5 days with weight gain and leg swelling then daily prn   gabapentin 300 MG  capsule Commonly known as: NEURONTIN 600 mg in am and 900 mg qhs What changed:   how much to take  how to take this  when to take this  additional instructions   Humira Pen 40 MG/0.4ML Pnkt Generic drug: Adalimumab Inject 40 mg into the skin every 14 (fourteen) days.   ipratropium-albuterol 0.5-2.5 (3) MG/3ML Soln Commonly known as: DUONEB Take 3 mLs by nebulization every 8 (eight) hours as needed.   lamoTRIgine 100 MG tablet Commonly known as: LAMICTAL Take 1 tablet (100 mg total) by mouth 2 (two) times daily.   leflunomide 20 MG tablet Commonly known as: ARAVA Take 1 tablet (20 mg total) by mouth daily.   lovastatin 40 MG tablet Commonly known as: MEVACOR Take 1 tablet (40 mg total) by mouth at bedtime.   Melatonin 10 MG Tabs Take 10 mg by mouth at bedtime.   mirabegron ER 50 MG Tb24 tablet Commonly known as: Myrbetriq Take 1 tablet (50 mg total) by mouth daily. What changed: when to take this   montelukast 10 MG tablet Commonly known as: SINGULAIR Take 1 tablet (10 mg total) by mouth daily.   multivitamin-lutein Caps capsule Take 1 capsule by mouth 2 (two) times daily.   mupirocin ointment 2 % Commonly known as: Bactroban Apply 1 application topically 2 (two) times daily. X 1 week   ondansetron 4 MG tablet Commonly known as: Zofran Take 1 tablet (4 mg total) by mouth every 8 (eight) hours as needed for nausea or vomiting.   oxyCODONE 5 MG immediate release tablet Commonly known as: Oxy IR/ROXICODONE Take 1-2 tablets (5-10 mg total) by mouth every 4 (four) hours as needed for breakthrough pain.   pantoprazole 40 MG tablet Commonly known as: PROTONIX Take 1 tablet (40 mg total) by mouth 2 (two) times daily. 30 minutes before food. Note reduction in frequency   predniSONE 10 MG tablet Commonly known as: DELTASONE 4 tabs for 2 days, then 3 tabs for 2 days, 2 tabs for 2 days, then 1 tab for 2 days, then stop   QUEtiapine 25 MG tablet Commonly known  as: SEROQUEL Take 1 tablet (25 mg total) by mouth at bedtime.   sucralfate 1 g tablet Commonly known as: CARAFATE TAKE  1 TABLET BY MOUTH 4 TIMES A DAY WITH MEALS AND AT BEDTIME   Teriparatide (Recombinant) 600 MCG/2.4ML Soln Commonly known as: Forteo Inject 0.08 mLs (20 mcg total) into the skin daily.   Tiotropium Bromide Monohydrate 2.5 MCG/ACT Aers Commonly known as: Spiriva Respimat Inhale 2 puffs into the lungs daily. What changed: when to take this   traMADol 50 MG tablet Commonly known as: ULTRAM Take 1 tablet (50 mg total) by mouth every 6 (six) hours as needed. What changed: reasons to take this       Diagnostic Studies: Dg Chest 2 View  Result Date: 07/14/2018 CLINICAL DATA:  Patient reports productive cough with blood at times and SOB x 3 weeks. Denies fever. Patient also reports she has been choking on liquids lately, aspiration per ordering notes. EXAM: CHEST - 2 VIEW COMPARISON:  03/25/2018 FINDINGS: Lungs are clear. Heart size and mediastinal contours are within normal limits. Aortic Atherosclerosis (ICD10-170.0). No effusion. right shoulder arthroplasty components stable. IMPRESSION: 1. No acute cardiopulmonary disease. Electronically Signed   By: Lucrezia Europe M.D.   On: 07/14/2018 14:01   Dg Shoulder Left Port  Result Date: 07/26/2018 CLINICAL DATA:  Postop left shoulder. EXAM: LEFT SHOULDER - 1 VIEW COMPARISON:  MRI 01/05/2018 FINDINGS: Total left shoulder replacement. Hardware intact. Anatomic alignment. No acute bony abnormality. IMPRESSION: Total left shoulder replacement anatomic alignment. Electronically Signed   By: Marcello Moores  Register   On: 07/26/2018 10:39   Disposition: Patient is allergic to aspirin per her chart and she is already on injectable medication at home.  Will discharge home on Eliquis for DVT prophylaxis.  Follow-up Information    Lattie Corns, PA-C Follow up in 14 day(s).   Specialty: Physician Assistant Why: Electa Sniff  information: [place] [address] [phone]          Signed: Judson Roch PA-C 07/28/2018, 7:32 AM

## 2018-08-01 ENCOUNTER — Other Ambulatory Visit: Payer: Self-pay | Admitting: Internal Medicine

## 2018-08-01 DIAGNOSIS — R05 Cough: Secondary | ICD-10-CM

## 2018-08-01 DIAGNOSIS — R059 Cough, unspecified: Secondary | ICD-10-CM

## 2018-08-03 ENCOUNTER — Other Ambulatory Visit: Payer: Self-pay | Admitting: Pulmonary Disease

## 2018-08-22 ENCOUNTER — Encounter: Payer: Self-pay | Admitting: Psychiatry

## 2018-08-22 ENCOUNTER — Other Ambulatory Visit: Payer: Self-pay

## 2018-08-22 ENCOUNTER — Ambulatory Visit (INDEPENDENT_AMBULATORY_CARE_PROVIDER_SITE_OTHER): Payer: Medicare Other | Admitting: Psychiatry

## 2018-08-22 DIAGNOSIS — F419 Anxiety disorder, unspecified: Secondary | ICD-10-CM | POA: Diagnosis not present

## 2018-08-22 DIAGNOSIS — F39 Unspecified mood [affective] disorder: Secondary | ICD-10-CM | POA: Diagnosis not present

## 2018-08-22 DIAGNOSIS — F329 Major depressive disorder, single episode, unspecified: Secondary | ICD-10-CM | POA: Diagnosis not present

## 2018-08-22 DIAGNOSIS — F411 Generalized anxiety disorder: Secondary | ICD-10-CM

## 2018-08-22 DIAGNOSIS — F32A Depression, unspecified: Secondary | ICD-10-CM

## 2018-08-22 MED ORDER — ALPRAZOLAM 0.25 MG PO TABS: ORAL_TABLET | ORAL | 2 refills | Status: DC

## 2018-08-22 MED ORDER — LAMOTRIGINE 100 MG PO TABS: 100.0000 mg | ORAL_TABLET | Freq: Two times a day (BID) | ORAL | 2 refills | Status: DC

## 2018-08-22 MED ORDER — ESCITALOPRAM OXALATE 10 MG PO TABS: 10.0000 mg | ORAL_TABLET | Freq: Every day | ORAL | 3 refills | Status: DC

## 2018-08-22 NOTE — Progress Notes (Signed)
Patient ID: Kerri Carter, female   DOB: 1940-04-25, 78 y.o.   MRN: [id number]   Patient is a 78 year old female with history of anxiety and depression who was followed up for medication management. She reported that she did not receive the Xanax prescription from the pharmacy and has been trying to call the office to get the refill. . She reported that she continues to feel anxious apprehensive due to the current situation. She has been compliant with other medications. She is currently living with her family and is trying to stay safe. She stated that she feels anxious and reported that she would like the trial of the alprazolam. We discussed about adverse Effects from medication and she is in agreement with the plan.  Plan Patient will be prescribed alprazolam 0.25 mg 1/2 to one pill on a PRN basis. I called medication in the pharmacy. Refill all the medications.. She will follow-up in two months earlier depending on her symptoms.   I connected with patient via telemedicine application and verified that I am speaking with the correct person using two identifiers.  I discussed the limitations of evaluation and management by telemedicine and the availability of in person appointments. The patient expressed understanding and agreed to proceed.   I discussed the assessment and treatment plan with the patient. The patient was provided an opportunity to ask questions and all were answered. The patient agreed with the plan and demonstrated an understanding of the instructions.   The patient was advised to call back or seek an in-person evaluation if the symptoms worsen or if the condition fails to improve as anticipated.   I provided 15 minutes of non-face-to-face time during this encounter.

## 2018-09-02 ENCOUNTER — Other Ambulatory Visit: Payer: Self-pay | Admitting: Pulmonary Disease

## 2018-09-05 ENCOUNTER — Telehealth: Payer: Self-pay | Admitting: Adult Health

## 2018-09-05 NOTE — Telephone Encounter (Signed)
Called spoke with patient and discussed recommendations for appt per [place] NP.  Patient okay with this and grateful.  Patient unable to come to the office for visit until Wednesday afternoon.  Appt scheduled with Tonya NP for  09/07/18 @1600  (patient stated this time will work best for her transportation).  Will sign and route to [facility] NP and Hinton Dyer to make them aware.

## 2018-09-05 NOTE — Telephone Encounter (Signed)
Yes can come in for visit ,would still place on precautions with N95 use but needs ov to determine next step .  Please set up with APP to be seen   Please contact office for sooner follow up if symptoms do not improve or worsen or seek emergency care

## 2018-09-05 NOTE — Telephone Encounter (Signed)
Primary Pulmonologist: BQ Last office visit and with whom: 07/12/2018 with TP What do we see them for (pulmonary problems): copd Last OV assessment/plan: Instructions    Return in about 2 weeks (around 07/26/2018) for Follow up Dr. Lake Bells. Augmentin 875mg  Twice daily  For 7 days, take w/ food.  Mucinex DM Twice daily  As needed  Cough/congestion -can try childs dose .  Prednisone taper over next week .  Tessalon Three times a day  As needed  Cough .  Continue on BREO and Spiriva daily , rinse after use  Follow up Dr. Lake Bells or [place] NP in 2 weeks and As needed   Please contact office for sooner follow up if symptoms do not improve or worsen or seek emergency care      Was appointment offered to patient (explain)?  Pt wants to come in for appt   Reason for call: Called and spoke with pt who states throughout the day she will cough. Pt said that her cough is a deep cough but she is unable to get any mucus up. Asked pt if her cough is worse in the morning or at night and pt said it is about the same.  Pt uses her breo and spiriva every day as prescribed. Asked pt if she is taking mucinex and pt said that she is unable to take it. Pt said she has been taking tessalon but stated that it has not really helped with her cough.  Pt denies any complaints of fever, no chest tightness, no wheezing. Pt does become SOB with her cough.  Pt denies any complaints of loss of smell, no headache, no muscle or abdominal pain, no sore throat.  Pt has been tested for COVID twice and both times the results were negative. Pt is wanting to come into the office for a face-to-face visit to have her cough reassessed in person since she has had a televisit recently. Tammy, please advise if you are okay with this being scheduled for pt.  (examples of things to ask: : When did symptoms start? Fever? Cough? Productive? Color to sputum? More sputum than usual? Wheezing? Have you needed increased oxygen? Are you taking  your respiratory medications? What over the counter measures have you tried?)

## 2018-09-07 ENCOUNTER — Ambulatory Visit (INDEPENDENT_AMBULATORY_CARE_PROVIDER_SITE_OTHER): Payer: Medicare Other

## 2018-09-07 ENCOUNTER — Ambulatory Visit (INDEPENDENT_AMBULATORY_CARE_PROVIDER_SITE_OTHER): Payer: Medicare Other | Admitting: Nurse Practitioner

## 2018-09-07 ENCOUNTER — Encounter: Payer: Self-pay | Admitting: Nurse Practitioner

## 2018-09-07 ENCOUNTER — Other Ambulatory Visit: Payer: Self-pay

## 2018-09-07 VITALS — BP 136/70 | HR 74 | Temp 98.0°F | Ht 61.5 in | Wt 172.0 lb

## 2018-09-07 DIAGNOSIS — R05 Cough: Secondary | ICD-10-CM | POA: Diagnosis not present

## 2018-09-07 DIAGNOSIS — R053 Chronic cough: Secondary | ICD-10-CM

## 2018-09-07 DIAGNOSIS — R059 Cough, unspecified: Secondary | ICD-10-CM

## 2018-09-07 MED ORDER — AMOXICILLIN-POT CLAVULANATE 875-125 MG PO TABS: 1.0000 | ORAL_TABLET | Freq: Two times a day (BID) | ORAL | 0 refills | Status: DC

## 2018-09-07 NOTE — Assessment & Plan Note (Signed)
Patient presents today for ongoing cough.  She was seen by Tammy on 07/12/2018 and was prescribed Augmentin and prednisone.  She had a follow-up with Sarah on 07/14/2018.  Patient states that the Augmentin and prednisone did help but after she finishes medications her cough did return.  Patient has had 2- COVID test.  Patient's last chest x-ray was negative.  She denies any significant shortness of breath.  She denies any fever.  She states that symptoms are worsening.  States that her cough is harsh and nonproductive.  Patient does use Breo and Spiriva.    Patient Instructions  Will order augmentin Will order chest x ray and call with results Will start flutter valve 4 times daily  Cough: Continue gastroesophageal reflux disease treatment with elevating the head your bed and taking antacids Continue taking over-the-counter antihistamines and nasal fluticasone to help with allergic rhinitis You need to try to suppress your cough to allow your larynx (voice box) to heal.  For three days don't talk, laugh, sing, or clear your throat. Do everything you can to suppress the cough during this time. Use hard candies (sugarless Jolly Ranchers) or non-mint or non-menthol containing cough drops during this time to soothe your throat.  Use a cough suppressant (Delsym or what I have prescribed you) around the clock during this time.  After three days, gradually increase the use of your voice and back off on the cough suppressants.    Follow up: Follow up with Dr. Lake Bells in 1 months or sooner if needed

## 2018-09-07 NOTE — Patient Instructions (Addendum)
Will order augmentin Will order chest x ray and call with results Will start flutter valve 4 times daily  Cough: Continue gastroesophageal reflux disease treatment with elevating the head your bed and taking antacids Continue taking over-the-counter antihistamines and nasal fluticasone to help with allergic rhinitis You need to try to suppress your cough to allow your larynx (voice box) to heal.  For three days don't talk, laugh, sing, or clear your throat. Do everything you can to suppress the cough during this time. Use hard candies (sugarless Jolly Ranchers) or non-mint or non-menthol containing cough drops during this time to soothe your throat.  Use a cough suppressant (Delsym or what I have prescribed you) around the clock during this time.  After three days, gradually increase the use of your voice and back off on the cough suppressants.    Follow up: Follow up with Dr. Lake Bells in 1 months or sooner if needed

## 2018-09-07 NOTE — Progress Notes (Signed)
@Patient  ID: Kerri Carter, female    DOB: 1940/02/22, 78 y.o.   MRN: [id number]  Chief Complaint  Patient presents with  . Cough    Tested twice for COVID which was negative. Cough is worse when she tries to lay down, or if throat is dry.     Referring provider: McLean-Scocuzza, Olivia Mackie *  HPI  78 year old female former smoker followed for COPD/Asthma RA on ARAVAAnd Humira(MTX was stopped 02/2016 ) She is a patient of Dr. Lake Bells  Tests: 04/2011 in March 2013 which showed: Ratio 70%, clear obstruction on flow volume loop, FEV1 1.79 L (81% predicted.), Total lung capacity 5.3 L (115% predicted), residual volume 2.49 L (136% predicted), DLCO 84% predicted PFT 11/2015 FEV1 94%, ratio 79, FVC 90%, DLCO 72% , ++BD response .  OV 09/07/18 - Cough  Patient presents today for ongoing cough.  She was seen by Tammy on 07/12/2018 and was prescribed Augmentin and prednisone.  She had a follow-up with Sarah on 07/14/2018.  Patient states that the Augmentin and prednisone did help but after she finishes medications her cough did return.  Patient has had 2- COVID test.  Patient's last chest x-ray was negative.  She denies any significant shortness of breath.  She denies any fever.  She states that symptoms are worsening.  States that her cough is harsh and nonproductive.  Patient does use Breo and Spiriva.  She also takes Delsym. Denies f/c/s, n/v/d, hemoptysis, PND, leg swelling.      Allergies  Allergen Reactions  . Ceftin [Cefuroxime Axetil] Anaphylaxis  . Lisinopril Anaphylaxis  . Morphine Other (See Comments)    Per patient, low blood pressure issues that requires action to raise it back up. Can take small infrequent doses  . Sulfasalazine Anaphylaxis  . Aspirin Other (See Comments)    Sulfasalazine allergy cross reacts  . Antihistamines, Chlorpheniramine-Type Other (See Comments)    Makes pt hyper  . Antivert [Meclizine Hcl] Other (See Comments)    Bladder will not empty  .  Decongestant [Pseudoephedrine Hcl] Other (See Comments)    Makes pt hyper  . Doxycycline Other (See Comments)    GI upset  . Polymyxin B Other (See Comments)    Medication was in eye drops.  . Sulfa Antibiotics Other (See Comments)    Face swelling  . Xarelto [Rivaroxaban] Other (See Comments)    Stomach burning, bleeding, and tar in stool  . Adhesive [Tape] Rash  . Iodine Hives and Rash    Per patient allergy is to contrast dye only, she is able to use betadine scrubs.  [place] [Levofloxacin In D5w] Rash  . Tetanus Toxoids Rash and Other (See Comments)    Fever and hot to touch at injection site    Immunization History  Administered Date(s) Administered  . Influenza Split 10/04/2012, 10/25/2013, 10/28/2016  . Influenza Whole 11/03/2011  . Influenza, High Dose Seasonal PF 10/04/2015, 11/03/2016  . Influenza,inj,Quad PF,6+ Mos 10/31/2014  . Influenza-Unspecified 01/02/2010, 11/24/2011, 10/21/2013, 09/28/2017  . Pneumococcal Conjugate-13 10/21/2013  . Pneumococcal Polysaccharide-23 11/07/2015    Past Medical History:  Diagnosis Date  . Anxiety   . Asthma   . Bipolar disorder ([place])   . Cataract    s/p b/l repair   . Chicken pox   . CKD (chronic kidney disease)   . CKD (chronic kidney disease), stage III ([place])    a. s/p R nephrectomy.  . Conversion disorder   . COPD (chronic obstructive pulmonary disease) ([place])   .  Depression   . Essential hypertension   . GERD (gastroesophageal reflux disease)   . Hyperlipidemia   . Inflammatory arthritis    a. hands/carpal tunnel.  b. Low titer rheumatoid factor. c. Negative anti-CCP antibodies. d. Plaquenil.  . Non-Obstructive CAD    a. 07/2009 Cath ([facility]): nonobs dzs;  b. 03/2011 Cath [facility]): nonobs dzs.  . Osteoarthritis    a. Knees.  . PUD (peptic ulcer disease)   . S/P right hip fracture    11/01/16 s/p repair  . Shoulder pain   . Sleep apnea    no cpap  . Spinal stenosis at L4-L5 level    severe with L4/L5  anterolisthesis grade 1 anterolisthesis   . Toxic maculopathy   . Valvular heart disease    a. 07/2015 Echo: EF 55-60%, Mild AI, AS, MR, and TR.    Tobacco History: Social History   Tobacco Use  Smoking Status Former Smoker  . Packs/day: 0.50  . Years: 20.00  . Pack years: 10.00  . Types: Cigarettes  . Quit date: 02/02/1974  . Years since quitting: 44.6  Smokeless Tobacco Never Used   Counseling given: Not Answered   Outpatient Encounter Medications as of 09/07/2018  Medication Sig  . Adalimumab (HUMIRA PEN) 40 MG/0.4ML PNKT Inject 40 mg into the skin every 14 (fourteen) days.   Marland Kitchen albuterol (VENTOLIN HFA) 108 (90 Base) MCG/ACT inhaler Inhale 2 puffs into the lungs every 6 (six) hours as needed. (Patient taking differently: Inhale 2 puffs into the lungs every 6 (six) hours as needed for wheezing or shortness of breath. )  . ALPRAZolam (XANAX) 0.25 MG tablet 1/2 - 1 daily prn  . apixaban (ELIQUIS) 2.5 MG TABS tablet Take 1 tablet (2.5 mg total) by mouth 2 (two) times daily.  . benzonatate (TESSALON) 200 MG capsule Take 1 capsule (200 mg total) by mouth 3 (three) times daily as needed for cough.  . BYSTOLIC 10 MG tablet TAKE 1 TABLET BY MOUTH EVERY DAY  . clotrimazole (LOTRIMIN) 1 % cream Apply 1 application topically 2 (two) times daily as needed (for irritation).  Marland Kitchen escitalopram (LEXAPRO) 10 MG tablet Take 1 tablet (10 mg total) by mouth daily.  . fluticasone furoate-vilanterol (BREO ELLIPTA) 200-25 MCG/INH AEPB Inhale 1 puff into the lungs daily. Rinse mouth  . furosemide (LASIX) 20 MG tablet Take 1 tablet (20 mg total) by mouth daily as needed. Take x  5 days with weight gain and leg swelling then daily prn  . gabapentin (NEURONTIN) 300 MG capsule Take 2-3 capsules (600-900 mg total) by mouth See admin instructions. Take 600 mg by mouth in the morning and 900 mg at bedtime  . ipratropium-albuterol (DUONEB) 0.5-2.5 (3) MG/3ML SOLN Take 3 mLs by nebulization every 8 (eight) hours as  needed.  . lamoTRIgine (LAMICTAL) 100 MG tablet Take 1 tablet (100 mg total) by mouth 2 (two) times daily.  Marland Kitchen leflunomide (ARAVA) 20 MG tablet Take 1 tablet (20 mg total) by mouth daily.  Marland Kitchen lovastatin (MEVACOR) 40 MG tablet Take 1 tablet (40 mg total) by mouth at bedtime.  . Melatonin 10 MG TABS Take 10 mg by mouth at bedtime.  . mirabegron ER (MYRBETRIQ) 50 MG TB24 tablet Take 1 tablet (50 mg total) by mouth daily. (Patient taking differently: Take 50 mg by mouth at bedtime. )  . montelukast (SINGULAIR) 10 MG tablet Take 1 tablet (10 mg total) by mouth daily.  . multivitamin-lutein (OCUVITE-LUTEIN) CAPS capsule Take 1 capsule by mouth 2 (two)  times daily.  . mupirocin ointment (BACTROBAN) 2 % Apply 1 application topically 2 (two) times daily. X 1 week  . ondansetron (ZOFRAN) 4 MG tablet Take 1 tablet (4 mg total) by mouth every 8 (eight) hours as needed for nausea or vomiting.  Marland Kitchen oxyCODONE (OXY IR/ROXICODONE) 5 MG immediate release tablet Take 1-2 tablets (5-10 mg total) by mouth every 4 (four) hours as needed for breakthrough pain.  . pantoprazole (PROTONIX) 40 MG tablet Take 1 tablet (40 mg total) by mouth 2 (two) times daily. 30 minutes before food. Note reduction in frequency  . QUEtiapine (SEROQUEL) 25 MG tablet Take 1 tablet (25 mg total) by mouth at bedtime.  . sucralfate (CARAFATE) 1 g tablet TAKE 1 TABLET BY MOUTH 4 TIMES A DAY WITH MEALS AND AT BEDTIME  . Teriparatide, Recombinant, (FORTEO) 600 MCG/2.4ML SOLN Inject 0.08 mLs (20 mcg total) into the skin daily.  . Tiotropium Bromide Monohydrate (SPIRIVA RESPIMAT) 2.5 MCG/ACT AERS Inhale 2 puffs into the lungs daily. (Patient taking differently: Inhale 2 puffs into the lungs at bedtime. )  . traMADol (ULTRAM) 50 MG tablet Take 1 tablet (50 mg total) by mouth every 6 (six) hours as needed.  Marland Kitchen amoxicillin-clavulanate (AUGMENTIN) 875-125 MG tablet Take 1 tablet by mouth 2 (two) times daily.  . [DISCONTINUED] predniSONE (DELTASONE) 10 MG  tablet 4 tabs for 2 days, then 3 tabs for 2 days, 2 tabs for 2 days, then 1 tab for 2 days, then stop (Patient not taking: Reported on 09/07/2018)   No facility-administered encounter medications on file as of 09/07/2018.      Review of Systems  Review of Systems  Constitutional: Negative.  Negative for chills and fever.  HENT: Negative.   Respiratory: Positive for cough. Negative for shortness of breath and wheezing.   Cardiovascular: Negative.  Negative for chest pain, palpitations and leg swelling.  Gastrointestinal: Negative.   Allergic/Immunologic: Negative.   Neurological: Negative.   Psychiatric/Behavioral: Negative.        Physical Exam  BP 136/70 (BP Location: Right Arm, Patient Position: Sitting, Cuff Size: Normal)   Pulse 74   Temp 98 F (36.7 C)   Ht 5' 1.5" (1.562 m)   Wt 172 lb (78 kg)   SpO2 93%   BMI 31.97 kg/m   Wt Readings from Last 5 Encounters:  09/07/18 172 lb (78 kg)  07/26/18 173 lb (78.5 kg)  07/22/18 173 lb (78.5 kg)  04/14/18 175 lb (79.4 kg)  03/25/18 173 lb 9.6 oz (78.7 kg)     Physical Exam Vitals signs and nursing note reviewed.  Constitutional:      General: She is not in acute distress.    Appearance: She is well-developed.  Cardiovascular:     Rate and Rhythm: Normal rate and regular rhythm.  Pulmonary:     Effort: Pulmonary effort is normal. No respiratory distress.     Breath sounds: Rhonchi present. No wheezing.  Musculoskeletal:        General: No swelling.  Neurological:     Mental Status: She is alert and oriented to person, place, and time.       Assessment & Plan:   Cough Patient presents today for ongoing cough.  She was seen by Tammy on 07/12/2018 and was prescribed Augmentin and prednisone.  She had a follow-up with Sarah on 07/14/2018.  Patient states that the Augmentin and prednisone did help but after she finishes medications her cough did return.  Patient has had 2- COVID test.  Patient's last chest x-ray was  negative.  She denies any significant shortness of breath.  She denies any fever.  She states that symptoms are worsening.  States that her cough is harsh and nonproductive.  Patient does use Breo and Spiriva.    Patient Instructions  Will order augmentin Will order chest x ray and call with results Will start flutter valve 4 times daily  Cough: Continue gastroesophageal reflux disease treatment with elevating the head your bed and taking antacids Continue taking over-the-counter antihistamines and nasal fluticasone to help with allergic rhinitis You need to try to suppress your cough to allow your larynx (voice box) to heal.  For three days don't talk, laugh, sing, or clear your throat. Do everything you can to suppress the cough during this time. Use hard candies (sugarless Jolly Ranchers) or non-mint or non-menthol containing cough drops during this time to soothe your throat.  Use a cough suppressant (Delsym or what I have prescribed you) around the clock during this time.  After three days, gradually increase the use of your voice and back off on the cough suppressants.    Follow up: Follow up with Dr. Lake Bells in 1 months or sooner if needed          Fenton Foy, NP 09/07/2018

## 2018-09-15 ENCOUNTER — Telehealth: Payer: Self-pay | Admitting: Psychiatry

## 2018-09-15 ENCOUNTER — Telehealth: Payer: Self-pay | Admitting: Gastroenterology

## 2018-09-15 ENCOUNTER — Telehealth: Payer: Self-pay

## 2018-09-15 NOTE — Telephone Encounter (Signed)
Patient currently scheduled to see Derl Barrow, NP on 10/07/18.

## 2018-09-15 NOTE — Telephone Encounter (Signed)
pt called states she needs a refill on her xanax.  Looks like dr. Gretel Acre did not print rx or eprescribe rx.     ALPRAZolam (XANAX) 0.25 MG tablet Medication Date: 08/22/2018 Department: [facility] Ordering/Authorizing: Rainey Pines, MD  Order Providers  Prescribing Provider Encounter Provider  Rainey Pines, MD Rainey Pines, MD  Outpatient Medication Detail   Disp Refills Start End   ALPRAZolam Duanne Moron) 0.25 MG tablet 15 tablet 2 08/22/2018    Sig: 1/2 - 1 daily prn   Class: No Print   Notes to Pharmacy: Called in pharmacy   Pharmacy  [id number] - [place], [address]

## 2018-09-15 NOTE — Telephone Encounter (Signed)
Per controlled substance database - xanax filled on 08/22/2018. I would recommend please call pharmacy to clarify this .

## 2018-09-15 NOTE — Telephone Encounter (Signed)
ERROR

## 2018-09-18 ENCOUNTER — Other Ambulatory Visit: Payer: Self-pay | Admitting: Urology

## 2018-09-19 ENCOUNTER — Telehealth: Payer: Self-pay | Admitting: Pulmonary Disease

## 2018-09-19 NOTE — Telephone Encounter (Signed)
Call returned to patient, she is wanting to make a consult appt with the [facility] office due to BQ no longer being in clinic. Consult appt made with Dr. Patsey Berthold. Made aware of visitor policy. Confirmed office location. Voiced understanding. Nothing further needed at this time.

## 2018-09-22 NOTE — Telephone Encounter (Signed)
Was this refill done? Not showing in the system.

## 2018-09-22 NOTE — Telephone Encounter (Signed)
was this message done? i dont see a refill on her xanax. does pt need appt?

## 2018-09-26 ENCOUNTER — Other Ambulatory Visit: Payer: Self-pay

## 2018-09-26 ENCOUNTER — Emergency Department: Payer: Medicare Other

## 2018-09-26 ENCOUNTER — Encounter: Payer: Self-pay | Admitting: Emergency Medicine

## 2018-09-26 ENCOUNTER — Emergency Department
Admission: EM | Admit: 2018-09-26 | Discharge: 2018-09-26 | Disposition: A | Discharge status: Home / Self Care | Payer: Medicare Other | Attending: Emergency Medicine | Admitting: Emergency Medicine

## 2018-09-26 ENCOUNTER — Other Ambulatory Visit: Payer: Self-pay | Admitting: Pulmonary Disease

## 2018-09-26 DIAGNOSIS — Z96611 Presence of right artificial shoulder joint: Secondary | ICD-10-CM | POA: Diagnosis not present

## 2018-09-26 DIAGNOSIS — I129 Hypertensive chronic kidney disease with stage 1 through stage 4 chronic kidney disease, or unspecified chronic kidney disease: Secondary | ICD-10-CM | POA: Insufficient documentation

## 2018-09-26 DIAGNOSIS — N183 Chronic kidney disease, stage 3 (moderate): Secondary | ICD-10-CM | POA: Insufficient documentation

## 2018-09-26 DIAGNOSIS — Z79899 Other long term (current) drug therapy: Secondary | ICD-10-CM | POA: Diagnosis not present

## 2018-09-26 DIAGNOSIS — Z87891 Personal history of nicotine dependence: Secondary | ICD-10-CM | POA: Insufficient documentation

## 2018-09-26 DIAGNOSIS — M25511 Pain in right shoulder: Secondary | ICD-10-CM | POA: Diagnosis present

## 2018-09-26 DIAGNOSIS — J449 Chronic obstructive pulmonary disease, unspecified: Secondary | ICD-10-CM | POA: Insufficient documentation

## 2018-09-26 MED ORDER — HYDROMORPHONE HCL 1 MG/ML IJ SOLN
0.5000 mg | Freq: Once | INTRAMUSCULAR | Status: AC
  Administered 2018-09-26: 23:00:00 0.5 mg via INTRAMUSCULAR
  Filled 2018-09-26: qty 1

## 2018-09-26 MED ORDER — METHYLPREDNISOLONE 4 MG PO TABS: 4.0000 mg | ORAL_TABLET | Freq: Every day | ORAL | 0 refills | Status: DC

## 2018-09-26 MED ORDER — HYDROMORPHONE HCL 1 MG/ML IJ SOLN
0.5000 mg | Freq: Once | INTRAMUSCULAR | Status: AC
  Administered 2018-09-26: 0.5 mg via INTRAMUSCULAR
  Filled 2018-09-26: qty 1

## 2018-09-26 NOTE — Discharge Instructions (Addendum)
Please take Medrol Dosepak as prescribed.  Use sling as needed for 2 to 3 days but then transition out.  If no improvement in the next 2 to 3 days call orthopedic office to schedule follow-up appointment.  Return to the ER for any increasing pain worsening symptoms or urgent changes in your health.

## 2018-09-26 NOTE — ED Notes (Signed)
Gave pt phone to call husband. Pt is alert and oriented. Pt declined a sling as she has slings at home.

## 2018-09-26 NOTE — ED Provider Notes (Signed)
[facility] EMERGENCY DEPARTMENT Provider Note   CSN: [id number] Arrival date & time: 09/26/18  2038     History   Chief Complaint Chief Complaint  Patient presents with   Shoulder Pain    HPI Kerri Carter is a 78 y.o. female presents to the emergency department for evaluation of right shoulder pain.  She has a history of right reverse total shoulder replacement.  Had been doing well up until today when she was opening a door and felt a pop in her right shoulder.  She denies any neck pain numbness tingling radicular symptoms.  No falling or trauma to any other part of her body.  She describes severe anterior right shoulder pain.  She does not take any medications for the pain although she has tramadol and oxycodone at home that she takes 1 tablet every 2 to 3 days as needed.     HPI  Past Medical History:  Diagnosis Date   Anxiety    Asthma    Bipolar disorder ([place])    Cataract    s/p b/l repair    Chicken pox    CKD (chronic kidney disease)    CKD (chronic kidney disease), stage III ([place])    a. s/p R nephrectomy.   Conversion disorder    COPD (chronic obstructive pulmonary disease) (HCC)    Depression    Essential hypertension    GERD (gastroesophageal reflux disease)    Hyperlipidemia    Inflammatory arthritis    a. hands/carpal tunnel.  b. Low titer rheumatoid factor. c. Negative anti-CCP antibodies. d. Plaquenil.   Non-Obstructive CAD    a. 07/2009 Cath ([facility]): nonobs dzs;  b. 03/2011 Cath [facility]): nonobs dzs.   Osteoarthritis    a. Knees.   PUD (peptic ulcer disease)    S/P right hip fracture    11/01/16 s/p repair   Shoulder pain    Sleep apnea    no cpap   Spinal stenosis at L4-L5 level    severe with L4/L5 anterolisthesis grade 1 anterolisthesis    Toxic maculopathy    Valvular heart disease    a. 07/2015 Echo: EF 55-60%, Mild AI, AS, MR, and TR.    Patient Active Problem List   Diagnosis Date Noted    Status post reverse arthroplasty of shoulder, left 07/26/2018   Small airways disease 07/07/2018   Fatigue 07/07/2018   Avascular necrosis of left shoulder due to adverse effect of steroid therapy ([place]) 01/20/2018   Status post reverse total shoulder replacement, right 11/04/2017   Leukocytosis 10/19/2017   Allergic rhinitis 09/28/2017   Rotator cuff tendinitis, right 07/26/2017   Strain of right hip 02/26/2017   History of fracture of left hip 01/15/2017   Status post lumbar spinal fusion 01/15/2017   Dislocation of hip joint prosthesis ([place]) 01/08/2017   Status post hip hemiarthroplasty 01/08/2017   Leg swelling 12/15/2016   Hip fracture ([place]) 10/31/2016   Polyneuropathy 10/28/2016   Left foot pain 10/19/2016   Spinal stenosis of lumbar region 09/15/2016   Osteoporosis 08/27/2016   Mixed bipolar I disorder ([place]) 10/09/2015   CKD (chronic kidney disease), stage III ([place])    Essential hypertension    Conversion disorder 08/04/2015   Hyperlipidemia 07/17/2015   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   Macular degeneration 07/17/2015   Insomnia 07/17/2015   Rheumatoid arthritis ([place]) 12/05/2014   Cough 05/02/2012   Valvular heart disease 10/13/2011   COPD (chronic obstructive pulmonary disease) ([place])  09/09/2011   OSA (obstructive sleep apnea) 09/09/2011   Nocturnal hypoxemia 09/09/2011   Gastroesophageal reflux disease 02/24/2011   Single kidney 02/24/2011    Past Surgical History:  Procedure Laterality Date   APPENDECTOMY     BACK SURGERY     BUNIONECTOMY Right    CATARACT EXTRACTION, BILATERAL     CESAREAN SECTION     x1   CHOLECYSTECTOMY N/A 05/11/2016   Procedure: LAPAROSCOPIC CHOLECYSTECTOMY;  Surgeon: Florene Glen, MD;  Location: [facility] ORS;  Service: General;  Laterality: N/A;   COLONOSCOPY WITH PROPOFOL N/A 04/02/2016   Procedure: COLONOSCOPY WITH PROPOFOL;  Surgeon: Jonathon Bellows, MD;  Location: [facility]  ENDOSCOPY;  Service: Endoscopy;  Laterality: N/A;   ENDOSCOPIC RETROGRADE CHOLANGIOPANCREATOGRAPHY (ERCP) WITH PROPOFOL N/A 05/08/2016   Procedure: ENDOSCOPIC RETROGRADE CHOLANGIOPANCREATOGRAPHY (ERCP) WITH PROPOFOL;  Surgeon: Lucilla Lame, MD;  Location: [facility] ENDOSCOPY;  Service: Endoscopy;  Laterality: N/A;   ERCP     with biliary spincterotomy 05/08/16 Dr. Allen Norris for choledocholithiasis    ESOPHAGEAL DILATION  04/02/2016   Procedure: ESOPHAGEAL DILATION;  Surgeon: Jonathon Bellows, MD;  Location: [facility] ENDOSCOPY;  Service: Endoscopy;;   ESOPHAGOGASTRODUODENOSCOPY (EGD) WITH PROPOFOL N/A 04/02/2016   Procedure: ESOPHAGOGASTRODUODENOSCOPY (EGD) WITH PROPOFOL;  Surgeon: Jonathon Bellows, MD;  Location: [facility] ENDOSCOPY;  Service: Endoscopy;  Laterality: N/A;   HIP ARTHROPLASTY Right 11/01/2016   Procedure: ARTHROPLASTY BIPOLAR HIP (HEMIARTHROPLASTY);  Surgeon: Corky Mull, MD;  Location: [facility] ORS;  Service: Orthopedics;  Laterality: Right;   NEPHRECTOMY  1988   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   osteoporosis     noted DEXA 08/19/16    REPLACEMENT TOTAL KNEE Right    REVERSE SHOULDER ARTHROPLASTY Right 11/04/2017   Procedure: REVERSE SHOULDER ARTHROPLASTY;  Surgeon: Corky Mull, MD;  Location: [facility] ORS;  Service: Orthopedics;  Laterality: Right;   REVERSE SHOULDER ARTHROPLASTY Left 07/26/2018   Procedure: REVERSE SHOULDER ARTHROPLASTY;  Surgeon: Corky Mull, MD;  Location: [facility] ORS;  Service: Orthopedics;  Laterality: Left;   TONSILLECTOMY     TOTAL HIP ARTHROPLASTY  12/10/11   [facility] left hip   TOTAL HIP ARTHROPLASTY Bilateral    TUBAL LIGATION       OB History    Gravida  4   Para  3   Term  2   Preterm  1   AB  1   Living  2     SAB  1   TAB      Ectopic      Multiple      Live Births  2            Home Medications    Prior to Admission medications   Medication Sig Start Date End Date Taking? Authorizing Provider  Adalimumab (HUMIRA  PEN) 40 MG/0.4ML PNKT Inject 40 mg into the skin every 14 (fourteen) days.     [provider]  albuterol (VENTOLIN HFA) 108 (90 Base) MCG/ACT inhaler Inhale 2 puffs into the lungs every 6 (six) hours as needed. Patient taking differently: Inhale 2 puffs into the lungs every 6 (six) hours as needed for wheezing or shortness of breath.  11/25/15   Juanito Doom, MD  ALPRAZolam Duanne Moron) 0.25 MG tablet 1/2 - 1 daily prn 08/22/18   Rainey Pines, MD  amoxicillin-clavulanate (AUGMENTIN) 875-125 MG tablet Take 1 tablet by mouth 2 (two) times daily. 09/07/18   Fenton Foy, NP  apixaban (ELIQUIS) 2.5 MG TABS tablet Take 1 tablet (2.5  mg total) by mouth 2 (two) times daily. 07/27/18   Lattie Corns, PA-C  benzonatate (TESSALON) 200 MG capsule Take 1 capsule (200 mg total) by mouth 3 (three) times daily as needed for cough. 07/12/18   Parrett, Fonnie Mu, NP  BYSTOLIC 10 MG tablet TAKE 1 TABLET BY MOUTH EVERY DAY 09/26/18   Juanito Doom, MD  clotrimazole (LOTRIMIN) 1 % cream Apply 1 application topically 2 (two) times daily as needed (for irritation). 12/22/17   McLean-Scocuzza, Nino Glow, MD  escitalopram (LEXAPRO) 10 MG tablet Take 1 tablet (10 mg total) by mouth daily. 08/22/18   Rainey Pines, MD  fluticasone furoate-vilanterol (BREO ELLIPTA) 200-25 MCG/INH AEPB Inhale 1 puff into the lungs daily. Rinse mouth 03/25/18   McLean-Scocuzza, Nino Glow, MD  furosemide (LASIX) 20 MG tablet Take 1 tablet (20 mg total) by mouth daily as needed. Take x  5 days with weight gain and leg swelling then daily prn 07/04/18   McLean-Scocuzza, Nino Glow, MD  gabapentin (NEURONTIN) 300 MG capsule Take 2-3 capsules (600-900 mg total) by mouth See admin instructions. Take 600 mg by mouth in the morning and 900 mg at bedtime 08/01/18   McLean-Scocuzza, Nino Glow, MD  ipratropium-albuterol (DUONEB) 0.5-2.5 (3) MG/3ML SOLN Take 3 mLs by nebulization every 8 (eight) hours as needed. 03/25/18   McLean-Scocuzza, Nino Glow, MD    lamoTRIgine (LAMICTAL) 100 MG tablet Take 1 tablet (100 mg total) by mouth 2 (two) times daily. 08/22/18   Rainey Pines, MD  leflunomide (ARAVA) 20 MG tablet Take 1 tablet (20 mg total) by mouth daily. 09/30/17   McLean-Scocuzza, Nino Glow, MD  lovastatin (MEVACOR) 40 MG tablet Take 1 tablet (40 mg total) by mouth at bedtime. 01/21/18   McLean-Scocuzza, Nino Glow, MD  Melatonin 10 MG TABS Take 10 mg by mouth at bedtime. 08/09/17   Rainey Pines, MD  methylPREDNISolone (MEDROL) 4 MG tablet Take 1 tablet (4 mg total) by mouth daily. Medrol Dosepak as directed 09/26/18   Duanne Guess, PA-C  mirabegron ER (MYRBETRIQ) 50 MG TB24 tablet Take 1 tablet (50 mg total) by mouth at bedtime. 09/19/18   Zara Council A, PA-C  montelukast (SINGULAIR) 10 MG tablet Take 1 tablet (10 mg total) by mouth daily. 06/14/18   McLean-Scocuzza, Nino Glow, MD  multivitamin-lutein [facility]) CAPS capsule Take 1 capsule by mouth 2 (two) times daily.    [provider]  mupirocin ointment (BACTROBAN) 2 % Apply 1 application topically 2 (two) times daily. X 1 week 10/19/17   McLean-Scocuzza, Nino Glow, MD  ondansetron (ZOFRAN) 4 MG tablet Take 1 tablet (4 mg total) by mouth every 8 (eight) hours as needed for nausea or vomiting. 03/10/17   McLean-Scocuzza, Nino Glow, MD  oxyCODONE (OXY IR/ROXICODONE) 5 MG immediate release tablet Take 1-2 tablets (5-10 mg total) by mouth every 4 (four) hours as needed for breakthrough pain. 07/27/18   Lattie Corns, PA-C  pantoprazole (PROTONIX) 40 MG tablet Take 1 tablet (40 mg total) by mouth 2 (two) times daily. 30 minutes before food. Note reduction in frequency 04/29/18   Crecencio Mc, MD  QUEtiapine (SEROQUEL) 25 MG tablet Take 1 tablet (25 mg total) by mouth at bedtime. 05/09/18   Rainey Pines, MD  sucralfate (CARAFATE) 1 g tablet TAKE 1 TABLET BY MOUTH 4 TIMES A DAY WITH MEALS AND AT BEDTIME 05/17/18   Jonathon Bellows, MD  Teriparatide, Recombinant, (FORTEO) 600 MCG/2.4ML SOLN Inject 0.08  mLs (20 mcg total) into the  skin daily. 11/03/16   Max Sane, MD  Tiotropium Bromide Monohydrate (SPIRIVA RESPIMAT) 2.5 MCG/ACT AERS Inhale 2 puffs into the lungs daily. Patient taking differently: Inhale 2 puffs into the lungs at bedtime.  02/24/18   McLean-Scocuzza, Nino Glow, MD  traMADol (ULTRAM) 50 MG tablet Take 1 tablet (50 mg total) by mouth every 6 (six) hours as needed. 07/27/18   Lattie Corns, PA-C    Family History Family History  Problem Relation Age of Onset   Rheum arthritis Mother    Asthma Mother    Parkinson's disease Mother    Heart disease Mother    Stroke Mother    Hypertension Mother    Heart attack Father    Heart disease Father    Hypertension Father    Diabetes Son    Gout Son    Asthma Sister    Heart disease Sister    Lung cancer Sister    Heart disease Sister    Heart disease Sister    Breast cancer Sister    Heart attack Sister    Heart disease Brother    Heart disease Maternal Grandmother    Diabetes Maternal Grandmother    Colon cancer Maternal Grandmother    Cancer Maternal Grandmother        Hodgkins lymphoma   Heart disease Brother    Alcohol abuse Brother    Depression Brother    Dementia Son     Social History Social History   Tobacco Use   Smoking status: Former Smoker    Packs/day: 0.50    Years: 20.00    Pack years: 10.00    Types: Cigarettes    Quit date: 02/02/1974    Years since quitting: 44.6   Smokeless tobacco: Never Used  Substance Use Topics   Alcohol use: No   Drug use: No     Allergies   Ceftin [cefuroxime axetil]; Lisinopril; Morphine; Sulfasalazine; Aspirin; Antihistamines, chlorpheniramine-type; Antivert [meclizine hcl]; Decongestant [pseudoephedrine hcl]; Doxycycline; Polymyxin b; Sulfa antibiotics; Xarelto [rivaroxaban]; Adhesive [tape]; Iodine; Levaquin [levofloxacin in d5w]; and Tetanus toxoids   Review of Systems Review of Systems  Constitutional: Negative for  fever.  Respiratory: Negative for shortness of breath.   Cardiovascular: Negative for chest pain.  Musculoskeletal: Positive for arthralgias. Negative for joint swelling, myalgias and neck pain.  Skin: Negative for wound.  Neurological: Negative for tremors, weakness and numbness.     Physical Exam Updated Vital Signs BP (!) 187/71 (BP Location: Left Arm)    Pulse 82    Temp 98.4 F (36.9 C) (Oral)    Resp 18    Ht 5' 1.5" (1.562 m)    Wt 75.8 kg    SpO2 95%    BMI 31.04 kg/m   Physical Exam Constitutional:      Appearance: She is well-developed.  HENT:     Head: Normocephalic and atraumatic.  Eyes:     Conjunctiva/sclera: Conjunctivae normal.  Neck:     Musculoskeletal: Normal range of motion.  Cardiovascular:     Rate and Rhythm: Normal rate.  Pulmonary:     Effort: Pulmonary effort is normal. No respiratory distress.  Musculoskeletal: Normal range of motion.     Comments: Right shoulder with no swelling warmth erythema.  Incision site completely healed.  No deformity.  No tenderness along the clavicle.  Tender along the anterior lateral shoulder with negative sulcus sign.  She has pain with internal X rotation has limited active abduction.  2+ radial pulse and 2+  cap refill.  Skin:    General: Skin is warm.     Findings: No rash.  Neurological:     Mental Status: She is alert and oriented to person, place, and time.  Psychiatric:        Behavior: Behavior normal.        Thought Content: Thought content normal.      ED Treatments / Results  Labs (all labs ordered are listed, but only abnormal results are displayed) Labs Reviewed - No data to display  EKG None  Radiology Dg Shoulder Right  Result Date: 09/26/2018 CLINICAL DATA:  Shoulder pain EXAM: RIGHT SHOULDER - 2+ VIEW COMPARISON:  11/04/2017 FINDINGS: Right shoulder replacement with normal alignment.  No fracture. IMPRESSION: Right shoulder replacement.  No acute osseous abnormality. Electronically Signed    By: Donavan Foil M.D.   On: 09/26/2018 21:27    Procedures Procedures (including critical care time)  Medications Ordered in ED Medications  HYDROmorphone (DILAUDID) injection 0.5 mg (has no administration in time range)  HYDROmorphone (DILAUDID) injection 0.5 mg (0.5 mg Intramuscular Given 09/26/18 2106)     Initial Impression / Assessment and Plan / ED Course  I have reviewed the triage vital signs and the nursing notes.  Pertinent labs & imaging results that were available during my care of the patient were reviewed by me and considered in my medical decision making (see chart for details).        78 year old female with right shoulder pain, history of right reverse total shoulder arthroplasty.  Patient pain improved significantly with Dilaudid.  Encouraged to rest and ice the shoulder.  Use a sling as needed for the next 2 to 3 days.  She will start Medrol Dosepak tomorrow.  She has tramadol and oxycodone that she takes every 2 to 3 days as needed, she will continue with this medication.  She understands to follow with orthopedics.  She will return to the ER for any worsening symptoms or urgent changes in her health.  Final Clinical Impressions(s) / ED Diagnoses   Final diagnoses:  Acute pain of right shoulder  History of total shoulder replacement, right    ED Discharge Orders         Ordered    methylPREDNISolone (MEDROL) 4 MG tablet  Daily     09/26/18 2212           Renata Caprice 09/26/18 2216    Arta Silence, MD 11/04/18 2151

## 2018-09-26 NOTE — Telephone Encounter (Signed)
Pt received a prescription of Xanax on 7/20- called in [id number] with 2 refills.

## 2018-09-26 NOTE — ED Notes (Signed)
Attempted to contact patient's husband after verifying phone number with patient, but he did not answer.

## 2018-09-26 NOTE — ED Notes (Signed)
Patient refused Band-aid over IM site.

## 2018-09-26 NOTE — ED Notes (Signed)
Pt presents to ED via POV with c/o R shoulder pain. Pt states was reaching after grabbing a pizza box when she felt a "pop" to her R shoulder. Pt noted to be holding her R arm, c/o severe pain with movement. Pt A&O x4, NAD noted at this time.

## 2018-10-03 ENCOUNTER — Encounter: Payer: Self-pay | Admitting: Pulmonary Disease

## 2018-10-03 ENCOUNTER — Ambulatory Visit (INDEPENDENT_AMBULATORY_CARE_PROVIDER_SITE_OTHER): Payer: Medicare Other | Admitting: Pulmonary Disease

## 2018-10-03 ENCOUNTER — Other Ambulatory Visit: Payer: Self-pay

## 2018-10-03 VITALS — BP 146/80 | HR 65 | Temp 98.1°F | Ht 61.5 in | Wt 171.0 lb

## 2018-10-03 DIAGNOSIS — J849 Interstitial pulmonary disease, unspecified: Secondary | ICD-10-CM | POA: Diagnosis not present

## 2018-10-03 DIAGNOSIS — R05 Cough: Secondary | ICD-10-CM

## 2018-10-03 DIAGNOSIS — J449 Chronic obstructive pulmonary disease, unspecified: Secondary | ICD-10-CM

## 2018-10-03 DIAGNOSIS — R059 Cough, unspecified: Secondary | ICD-10-CM

## 2018-10-03 DIAGNOSIS — J310 Chronic rhinitis: Secondary | ICD-10-CM | POA: Diagnosis not present

## 2018-10-03 DIAGNOSIS — Z23 Encounter for immunization: Secondary | ICD-10-CM

## 2018-10-03 NOTE — Progress Notes (Signed)
Subjective:    Patient ID: Kerri Carter, female    DOB: May 25, 1940, 78 y.o.   MRN: [id number]  HPI The patient is a 78 year old remote former smoker, who has been following with Dr. Simonne Maffucci for the issue of COPD.  She initially was seen at this office in July 2013 and subsequently continue to follow with Dr. Lake Bells at the [facility] office.  As Dr. Lake Bells is now transitioning to full-time critical care, she wishes to establish again with this office.  She has also been seen by the pulmonary nurse practitioners at the [facility] office.  She appears to have an asthma/COPD overlap syndrome.  Her initial  pulmonary function tests were performed on March 2013 which showed: FEV1/FVC ratio 70%, obstruction pattern on flow volume loop, FEV1 1.79 L (81% predicted.), Total lung capacity 5.3 L (115% predicted), residual volume 2.49 L (136% predicted), DLCO 84% predicted.  Pulmonary function testing performed in October 2017 showed an FEV1 of 94%, FVC of 90% with an FEV1/FVC ratio of 79%.  Diffusion capacity was 72% and she had significant bronchodilator response.  Other significant issues: She does have rheumatoid arthritis and has been on [place] for a number of years.  She started Humira approximately 3 months ago.  She had previously been on methotrexate but this was stopped in January 2018.  She had been on Forteo for approximately 2 years and quit this August of last year.  Today she presents with the complaint of cough.  She states that this has been worse since November 2019.  She has noted very rarely that she has sputum production which occasionally is yellow to green.  She has been given a prednisone taper pack and antibiotics for this issue and temporarily improves but then the symptoms recur.  She has not had any fevers or chills.  She has had an isolated instance of hemoptysis (she is also on Eliquis) has not recurred.  She has tested negative twice to COVID-19.  Most  recent chest x-ray was read as negative on 5 August on my independent review she has some mild increased interstitial prominence.  She has not found anything that has helped with her cough with the exception of rounds of prednisone and antibiotics.  She believes the prednisone more than the antibiotics helped.  She notices that her cough is worsened if she goes outside and when she first lays down at night.  He does not note any coughing while eating or after eating.  On her initial presentation to her primary care physician with the complaint of cough in November 2019 she had Breo Ellipta increased from 100 / 25 to 200 /25.  She also had Spiriva Respimat added to her medication regimen.  She has only noticed perhaps a slight increase in her symptoms since those changes occurred.  She has significant issues with gastroesophageal reflux and her Protonix was increased to 40 mg twice a day.  She has noted marked improvement on her gastroesophageal reflux issues since this was done.  She has not had any chest pain, paroxysmal nocturnal dyspnea nor orthopnea.  She has noted some mild dyspnea on exertion but this is a chronic issue and not increased from her baseline.  Past medical history, surgical history, allergies, family history have been reviewed.  Social history likewise reviewed smoking history she smoked a half a pack of cigarettes per day for 20 years.  Review of Systems  Constitutional: Negative.   HENT: Positive for postnasal drip.  Eyes: Negative.   Respiratory: Positive for cough and shortness of breath.   Cardiovascular: Negative.   Gastrointestinal: Negative.   Endocrine: Negative.   Genitourinary: Positive for frequency and urgency.  Musculoskeletal: Positive for arthralgias and joint swelling.  Skin: Negative.   Allergic/Immunologic: Negative.   Neurological: Negative.   Hematological: Negative.   Psychiatric/Behavioral: Negative.   All other systems reviewed and are negative.          Objective:   Physical Exam Vitals signs and nursing note reviewed.  Constitutional:      General: She is not in acute distress.    Appearance: She is overweight. She is not ill-appearing.     Comments: In wheelchair.  HENT:     Head: Normocephalic and atraumatic.     Right Ear: External ear normal.     Left Ear: External ear normal.     Nose:     Comments: Nose/mouth/throat not examined due to masking requirements for COVID 19. Eyes:     Extraocular Movements: Extraocular movements intact.     Conjunctiva/sclera: Conjunctivae normal.  Neck:     Musculoskeletal: Normal range of motion and neck supple.     Thyroid: No thyromegaly.     Trachea: Trachea and phonation normal.  Cardiovascular:     Rate and Rhythm: Normal rate and regular rhythm.     Pulses: Normal pulses.     Heart sounds: S1 normal and S2 normal. Murmur present. Systolic (Left sternal border) murmur present with a grade of 1/6.  Pulmonary:     Effort: Pulmonary effort is normal.     Breath sounds: No wheezing or rhonchi.     Comments: Coarse breath sounds throughout, faint dry crackles at the bases. Abdominal:     General: Abdomen is protuberant. There is no distension.  Musculoskeletal:     Right lower leg: No edema.     Left lower leg: No edema.     Comments: Limited range of motion due to RA.  Lymphadenopathy:     Cervical: No cervical adenopathy.  Skin:    General: Skin is warm and dry.  Neurological:     General: No focal deficit present.     Mental Status: She is alert and oriented to person, place, and time.  Psychiatric:        Mood and Affect: Mood normal.        Behavior: Behavior normal. Behavior is cooperative.           Assessment & Plan:   1.  Chronic cough: Patient has noticed worsening issues with cough since November 2019.  She is on medications that can aggravate issues with recurrent bronchitis and worsen respiratory issues (RA medications), she has also been on Forteo  which had as a side effect potential for cough.  In addition, she has been switched to a dry powder inhaler from metered-dose inhaler.  Dry powders tend to have more issues with side effect of cough.  Additionally her issues could be due to poorly compensated asthma/COPD overlap syndrome.  She will be scheduled for PFTs to evaluate this.  She has not been rinsing well after the use of the [facility] and we recommended that she do this and add a small amount of baking soda to the rinse water.  Because of wild interstitial changes noted on chest x-ray will obtain a CT scan of the chest particularly in view of her recent issues with a single episode of hemoptysis.   2.  Asthma COPD overlap  syndrome: As noted above she is to continue [employer] and Spiriva.  She has been instructed on proper rinsing of her mouth after the use of Breo.  We will consider switching to a metered-dose inhaler form of delivery if she continues to have issues with cough.  3.  Interstitial lung disease: Radiographically she appears to have some mild increased interstitial changes.  These may be related to rheumatoid arthritis.  As noted above CT scan of the chest will be obtained to further evaluate this issue.  4.  Perennial nonallergic rhinitis: She has significant issues with postnasal drip and chronic rhinitis.  She is intolerant of antihistamines.  Continue montelukast for now.  Recommendations for nasal hygiene given.  5.  Need for influenza vaccine: She received high-dose influenza vaccine today.   Total time of the visit: 45 minutes.  This chart was dictated using voice recognition software/Dragon.  Despite best efforts to proofread, errors can occur which can change the meaning.  Any change was purely unintentional.

## 2018-10-03 NOTE — Patient Instructions (Signed)
1.  Rinse well after use of Breo.  I recommend using a small amount of baking soda in the water that he used to rinse with  2.  Try switching to a wedge pillow (antireflux pillow) to see if this helps with your issues with cough at nighttime.  Remember not to retire for bedtime for at least 2 to 3 hours after your last evening meal.  3.  You received your flu vaccine today.  4.  We will obtain a CT scan of the chest to evaluate the effect of rheumatoid arthritis in your lungs and to further evaluate your issues with cough.  5.  We will also obtain breathing tests.

## 2018-10-04 IMAGING — US US RENAL
1 series · 14 of 25 positions shown · non-contrast
Comparison: Renal ultrasound dated October 07, 2016. CT abdomen
pelvis dated June 12, 2016.

CLINICAL DATA: Left renal mass.

EXAM:
RENAL / URINARY TRACT ULTRASOUND COMPLETE

[Series 1: us renal · 58 acquisitions, 14 frames shown]
[im 1/58]
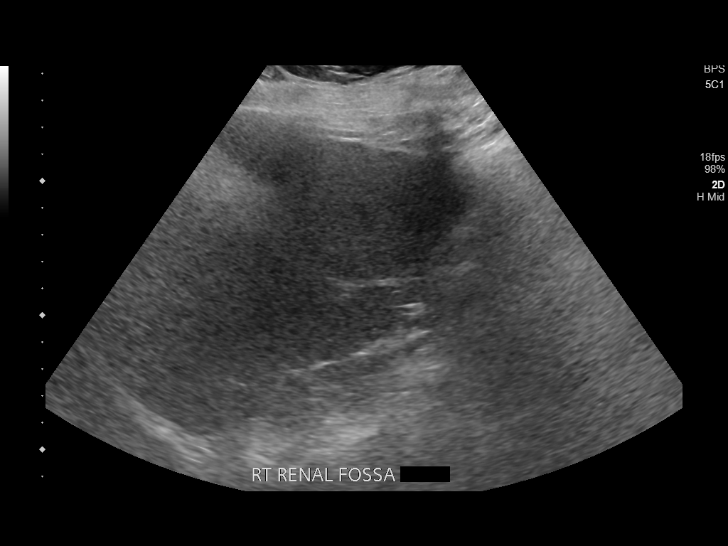
[im 5/58]
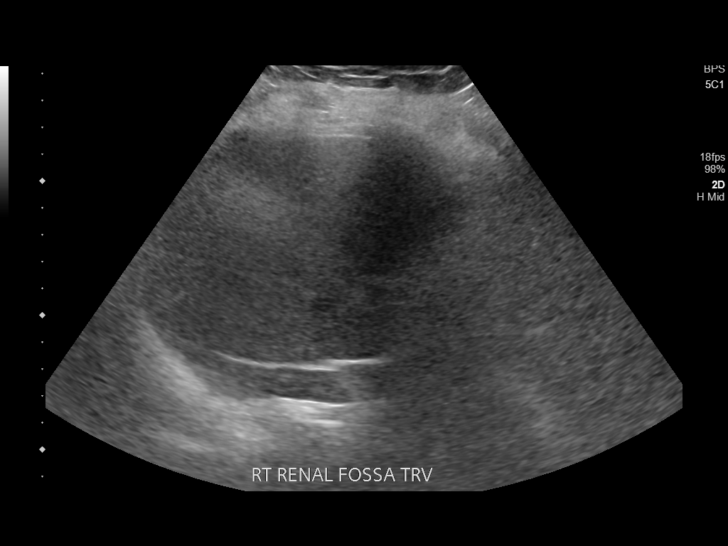
[im 10/58]
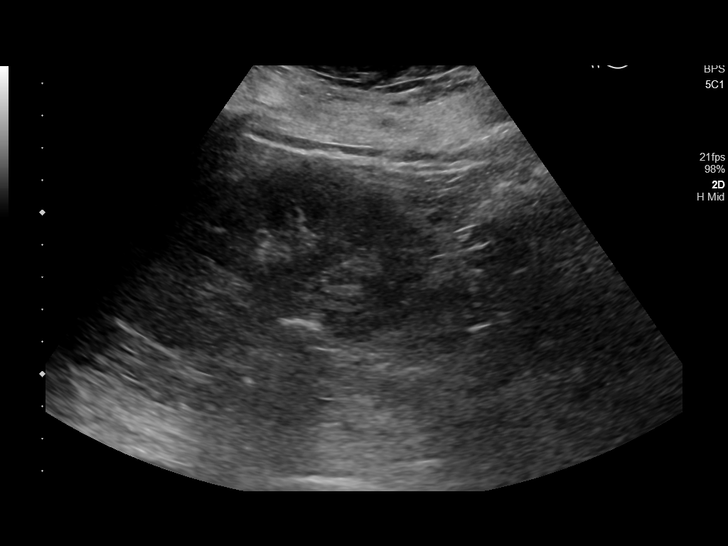
[im 15/58]
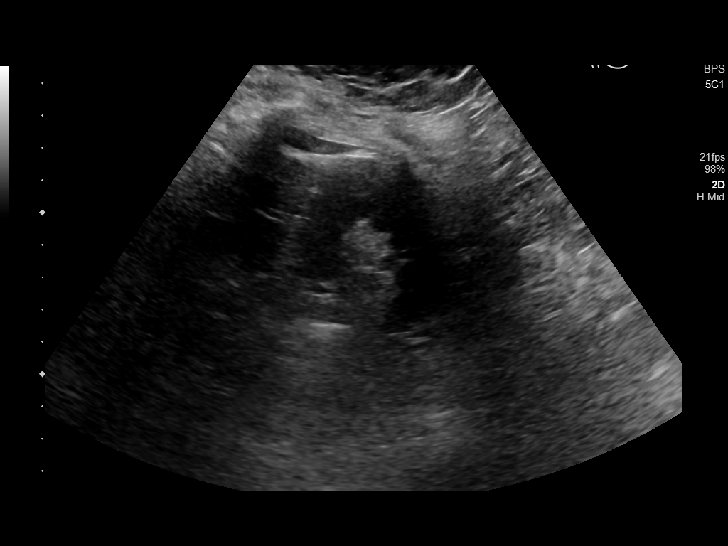
[im 20/58]
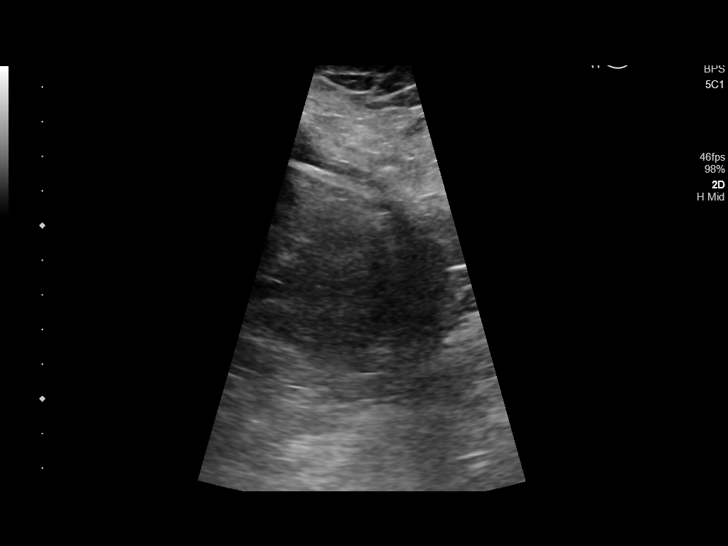
[im 22/58]
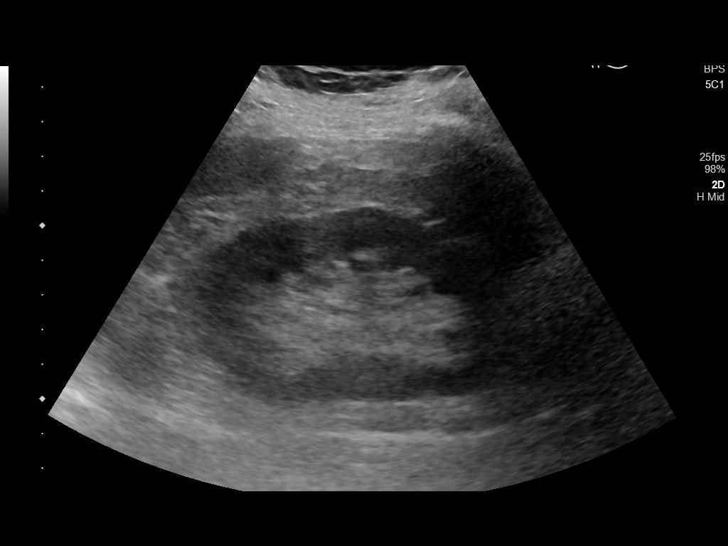
[im 27/58]
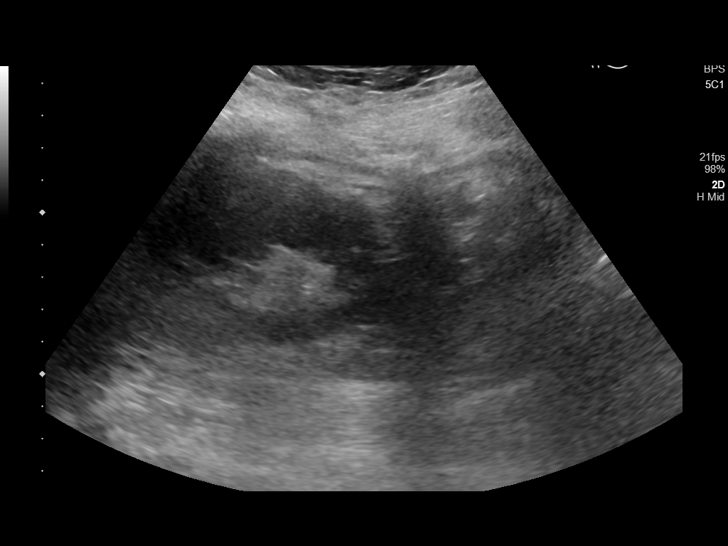
[im 31/58]
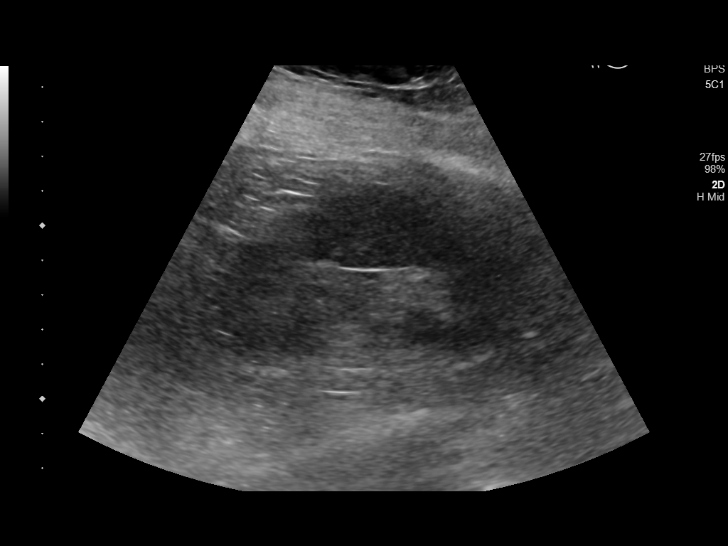
[im 36/58]
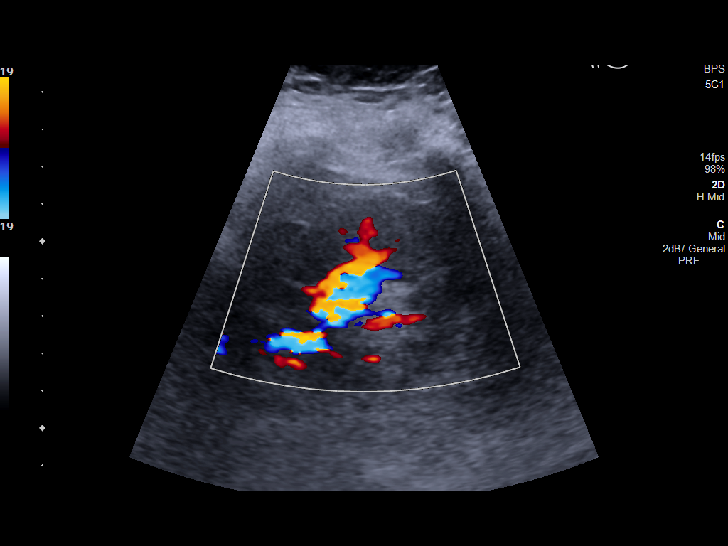
[im 39/58]
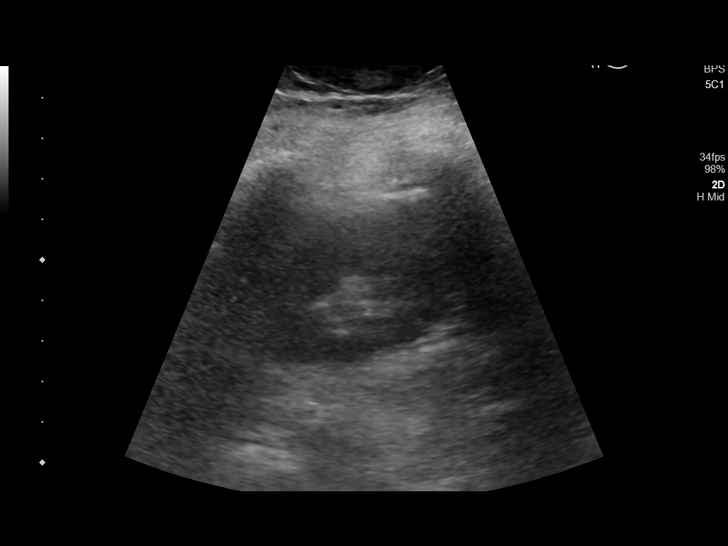
[im 43/58]
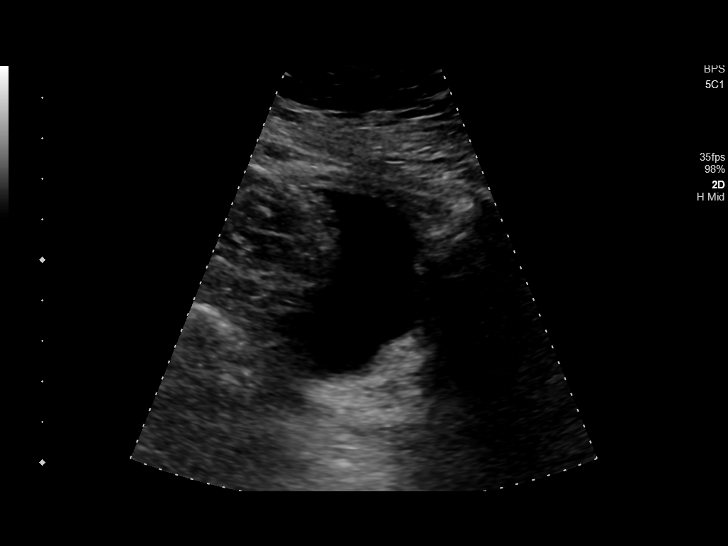
[im 48/58]
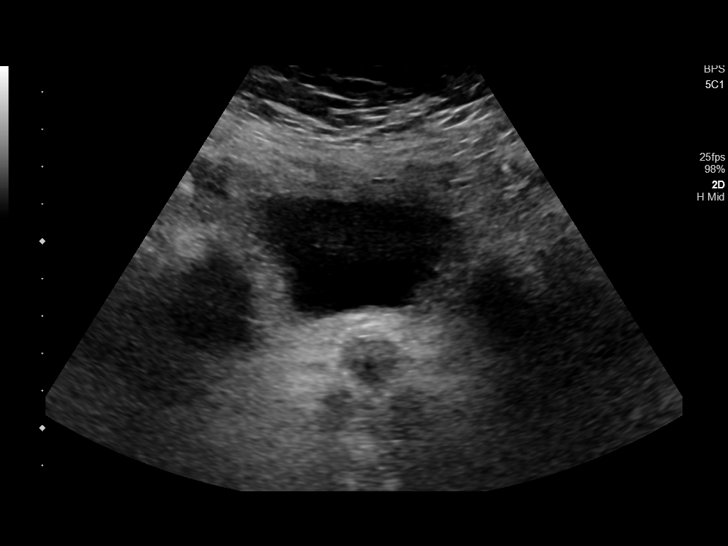
[im 53/58]
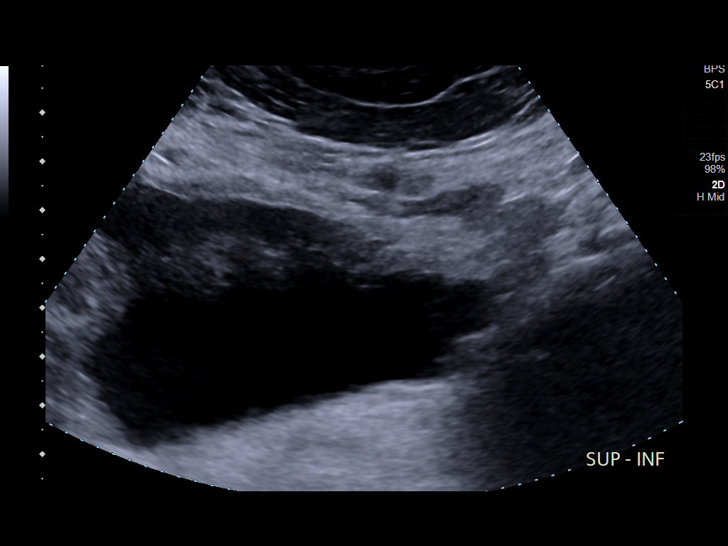
[im 58/58]
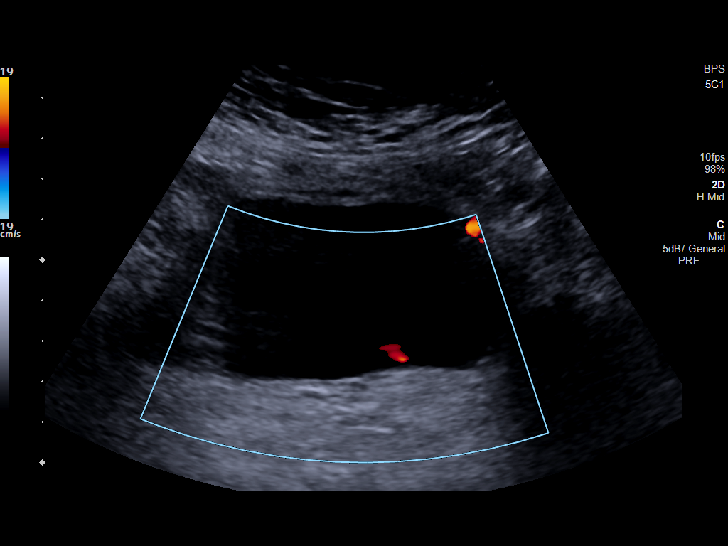

[14 of 25 positions shown; findings below may reference images not displayed]

FINDINGS: Right Kidney:

Surgically absent.

Left Kidney:

Length: 10.5 cm. Echogenicity within normal limits. No mass or
hydronephrosis visualized. Small 1.0 cm cyst in the lower pole.

Bladder:

Appears normal for degree of bladder distention.

Incidental note is made of a 2.2 cm simple cyst in the left ovary,
unchanged when compared to prior CT from June 2016.
IMPRESSION: 1. No suspicious left renal mass.  Small cyst in the lower pole.
2. Prior right nephrectomy.
3. Stable 2.2 cm simple cyst in the left ovary. This is almost
certainly benign, but follow up ultrasound is recommended yearly
according to the Society of Radiologists in JltrasoundXJHJ Consensus
Conference Statement (Rudi Jumper et al. Management of Asymptomatic
Ovarian and Other Adnexal Cysts Imaged at US: Society of
Radiologists in Ultrasound Consensus Conference Statement 2686.
Radiology [DATE]): [id number].).

## 2018-10-07 ENCOUNTER — Ambulatory Visit: Payer: Medicare Other | Admitting: Primary Care

## 2018-10-13 ENCOUNTER — Other Ambulatory Visit: Payer: Self-pay

## 2018-10-13 ENCOUNTER — Other Ambulatory Visit: Payer: Self-pay | Admitting: Pulmonary Disease

## 2018-10-13 ENCOUNTER — Ambulatory Visit
Admission: RE | Admit: 2018-10-13 | Discharge: 2018-10-13 | Disposition: A | Discharge status: Home / Self Care | Payer: Medicare Other | Source: Ambulatory Visit | Attending: Pulmonary Disease | Admitting: Pulmonary Disease

## 2018-10-13 ENCOUNTER — Ambulatory Visit
Admission: RE | Admit: 2018-10-13 | Discharge: 2018-10-13 | Disposition: A | Discharge status: Home / Self Care | Payer: Medicare Other | Source: Ambulatory Visit | Attending: Urology | Admitting: Urology

## 2018-10-13 DIAGNOSIS — R05 Cough: Secondary | ICD-10-CM

## 2018-10-13 DIAGNOSIS — N2889 Other specified disorders of kidney and ureter: Secondary | ICD-10-CM | POA: Insufficient documentation

## 2018-10-13 DIAGNOSIS — J849 Interstitial pulmonary disease, unspecified: Secondary | ICD-10-CM

## 2018-10-13 DIAGNOSIS — R059 Cough, unspecified: Secondary | ICD-10-CM

## 2018-10-18 ENCOUNTER — Ambulatory Visit (INDEPENDENT_AMBULATORY_CARE_PROVIDER_SITE_OTHER): Payer: Medicare Other | Admitting: Urology

## 2018-10-18 ENCOUNTER — Encounter: Payer: Self-pay | Admitting: Urology

## 2018-10-18 ENCOUNTER — Other Ambulatory Visit: Payer: Self-pay

## 2018-10-18 VITALS — BP 107/63 | HR 87 | Ht 61.5 in | Wt 163.0 lb

## 2018-10-18 DIAGNOSIS — R350 Frequency of micturition: Secondary | ICD-10-CM | POA: Diagnosis not present

## 2018-10-18 DIAGNOSIS — N183 Chronic kidney disease, stage 3 unspecified: Secondary | ICD-10-CM

## 2018-10-18 DIAGNOSIS — N2889 Other specified disorders of kidney and ureter: Secondary | ICD-10-CM | POA: Diagnosis not present

## 2018-10-18 DIAGNOSIS — N3941 Urge incontinence: Secondary | ICD-10-CM | POA: Diagnosis not present

## 2018-10-18 NOTE — Progress Notes (Signed)
10/18/2018 3:38 PM   Kerri Carter 29-Nov-1940 [id number]  Referring provider: McLean-Scocuzza, Nino Glow, MD [place],  [place] [address]  Chief Complaint  Patient presents with   Follow-up    HPI: 78 year old female with a solitary kidney who returns today for routine annual follow-up.  CT of the abdomen pelvis from 03/2016 revealed multiple hypodensities too small to characterize as well as a possible area of suspicious heterogeneity within the lower pole.  She is been followed with serial ultrasound since that time which have been unremarkable without evidence of lesions or tumors.  Her most recent renal ultrasound from 10/14/2018 was unremarkable, no masses lesions or tumors identified on her left kidney.  Her right kidney is surgically absent.  She has a stable left ovarian cyst.  Creatinine stable, 1.1 on 07/28/2018.  She also has a history of urinary urgency and urge incontinence.  She previously tried and failed Myrbetriq 25 mg.  At last year's appointment, this was increased to 50 mg.  She reports that she now has time to get to the bathroom especially first in the morning.  She takes the medication the morning is effective all day.  She has decreased urinary urgency frequency.  She has no side effects from this medication.  She like to continue this medication.  No dysuria or gross hematuria.  No flank pain.  No weight loss.   PMH: Past Medical History:  Diagnosis Date   Anxiety    Asthma    Bipolar disorder ([place])    Cataract    s/p b/l repair    Chicken pox    CKD (chronic kidney disease)    CKD (chronic kidney disease), stage III ([place])    a. s/p R nephrectomy.   Conversion disorder    COPD (chronic obstructive pulmonary disease) (HCC)    Depression    Essential hypertension    GERD (gastroesophageal reflux disease)    Hyperlipidemia    Inflammatory arthritis    a. hands/carpal tunnel.  b. Low titer rheumatoid factor. c. Negative  anti-CCP antibodies. d. Plaquenil.   Non-Obstructive CAD    a. 07/2009 Cath ([facility]): nonobs dzs;  b. 03/2011 Cath [facility]): nonobs dzs.   Osteoarthritis    a. Knees.   PUD (peptic ulcer disease)    S/P right hip fracture    11/01/16 s/p repair   Shoulder pain    Sleep apnea    no cpap   Spinal stenosis at L4-L5 level    severe with L4/L5 anterolisthesis grade 1 anterolisthesis    Toxic maculopathy    Valvular heart disease    a. 07/2015 Echo: EF 55-60%, Mild AI, AS, MR, and TR.    Surgical History: Past Surgical History:  Procedure Laterality Date   APPENDECTOMY     BACK SURGERY     BUNIONECTOMY Right    CATARACT EXTRACTION, BILATERAL     CESAREAN SECTION     x1   CHOLECYSTECTOMY N/A 05/11/2016   Procedure: LAPAROSCOPIC CHOLECYSTECTOMY;  Surgeon: Florene Glen, MD;  Location: [facility];  Service: General;  Laterality: N/A;   COLONOSCOPY WITH PROPOFOL N/A 04/02/2016   Procedure: COLONOSCOPY WITH PROPOFOL;  Surgeon: Jonathon Bellows, MD;  Location: [facility];  Service: Endoscopy;  Laterality: N/A;   ENDOSCOPIC RETROGRADE CHOLANGIOPANCREATOGRAPHY (ERCP) WITH PROPOFOL N/A 05/08/2016   Procedure: ENDOSCOPIC RETROGRADE CHOLANGIOPANCREATOGRAPHY (ERCP) WITH PROPOFOL;  Surgeon: Lucilla Lame, MD;  Location: [facility];  Service: Endoscopy;  Laterality: N/A;   ERCP  with biliary spincterotomy 05/08/16 Dr. Allen Norris for choledocholithiasis    ESOPHAGEAL DILATION  04/02/2016   Procedure: ESOPHAGEAL DILATION;  Surgeon: Jonathon Bellows, MD;  Location: [facility];  Service: Endoscopy;;   ESOPHAGOGASTRODUODENOSCOPY (EGD) WITH PROPOFOL N/A 04/02/2016   Procedure: ESOPHAGOGASTRODUODENOSCOPY (EGD) WITH PROPOFOL;  Surgeon: Jonathon Bellows, MD;  Location: [facility];  Service: Endoscopy;  Laterality: N/A;   HIP ARTHROPLASTY Right 11/01/2016   Procedure: ARTHROPLASTY BIPOLAR HIP (HEMIARTHROPLASTY);  Surgeon: Corky Mull, MD;  Location: [facility];  Service: Orthopedics;  Laterality: Right;    NEPHRECTOMY  1988   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   osteoporosis     noted DEXA 08/19/16    REPLACEMENT TOTAL KNEE Right    REVERSE SHOULDER ARTHROPLASTY Right 11/04/2017   Procedure: REVERSE SHOULDER ARTHROPLASTY;  Surgeon: Corky Mull, MD;  Location: [facility];  Service: Orthopedics;  Laterality: Right;   REVERSE SHOULDER ARTHROPLASTY Left 07/26/2018   Procedure: REVERSE SHOULDER ARTHROPLASTY;  Surgeon: Corky Mull, MD;  Location: [facility];  Service: Orthopedics;  Laterality: Left;   TONSILLECTOMY     TOTAL HIP ARTHROPLASTY  12/10/11   [facility] left hip   TOTAL HIP ARTHROPLASTY Bilateral    TUBAL LIGATION      Home Medications:  Allergies as of 10/18/2018      Reactions   Ceftin [cefuroxime Axetil] Anaphylaxis   Lisinopril Anaphylaxis   Morphine Other (See Comments)   Per patient, low blood pressure issues that requires action to raise it back up. Can take small infrequent doses   Sulfasalazine Anaphylaxis   Aspirin Other (See Comments)   Sulfasalazine allergy cross reacts   Antihistamines, Chlorpheniramine-type Other (See Comments)   Makes pt hyper   Antivert [meclizine Hcl] Other (See Comments)   Bladder will not empty   Decongestant [pseudoephedrine Hcl] Other (See Comments)   Makes pt hyper   Doxycycline Other (See Comments)   GI upset   Polymyxin B Other (See Comments)   Medication was in eye drops.   Sulfa Antibiotics Other (See Comments)   Face swelling   Xarelto [rivaroxaban] Other (See Comments)   Stomach burning, bleeding, and tar in stool   Adhesive [tape] Rash   Iodine Hives, Rash   Per patient allergy is to contrast dye only, she is able to use betadine scrubs.   Levaquin [levofloxacin In D5w] Rash   Tetanus Toxoids Rash, Other (See Comments)   Fever and hot to touch at injection site      Medication List       Accurate as of October 18, 2018  3:38 PM. If you have any questions, ask your nurse or doctor.          albuterol 108 (90 Base) MCG/ACT inhaler Commonly known as: Ventolin HFA Inhale 2 puffs into the lungs every 6 (six) hours as needed. What changed: reasons to take this   ALPRAZolam 0.25 MG tablet Commonly known as: XANAX 1/2 - 1 daily prn   benzonatate 200 MG capsule Commonly known as: TESSALON Take 1 capsule (200 mg total) by mouth 3 (three) times daily as needed for cough.   Bystolic 10 MG tablet Generic drug: nebivolol TAKE 1 TABLET BY MOUTH EVERY DAY   clotrimazole 1 % cream Commonly known as: LOTRIMIN Apply 1 application topically 2 (two) times daily as needed (for irritation).   escitalopram 10 MG tablet Commonly known as: LEXAPRO Take 1 tablet (10 mg total) by mouth daily.   fluticasone furoate-vilanterol 200-25 MCG/INH  Aepb Commonly known as: Breo Ellipta Inhale 1 puff into the lungs daily. Rinse mouth   furosemide 20 MG tablet Commonly known as: LASIX Take 1 tablet (20 mg total) by mouth daily as needed. Take x  5 days with weight gain and leg swelling then daily prn   gabapentin 300 MG capsule Commonly known as: NEURONTIN Take 2-3 capsules (600-900 mg total) by mouth See admin instructions. Take 600 mg by mouth in the morning and 900 mg at bedtime   Humira Pen 40 MG/0.4ML Pnkt Generic drug: Adalimumab Inject 40 mg into the skin every 14 (fourteen) days.   ipratropium-albuterol 0.5-2.5 (3) MG/3ML Soln Commonly known as: DUONEB Take 3 mLs by nebulization every 8 (eight) hours as needed.   lamoTRIgine 100 MG tablet Commonly known as: LAMICTAL Take 1 tablet (100 mg total) by mouth 2 (two) times daily.   leflunomide 20 MG tablet Commonly known as: ARAVA Take 1 tablet (20 mg total) by mouth daily.   lovastatin 40 MG tablet Commonly known as: MEVACOR Take 1 tablet (40 mg total) by mouth at bedtime.   Melatonin 10 MG Tabs Take 10 mg by mouth at bedtime.   methylPREDNISolone 4 MG tablet Commonly known as: Medrol Take 1 tablet (4 mg total) by mouth  daily. Medrol Dosepak as directed   mirabegron ER 50 MG Tb24 tablet Commonly known as: Myrbetriq Take 1 tablet (50 mg total) by mouth at bedtime.   montelukast 10 MG tablet Commonly known as: SINGULAIR Take 1 tablet (10 mg total) by mouth daily.   multivitamin-lutein Caps capsule Take 1 capsule by mouth 2 (two) times daily.   mupirocin ointment 2 % Commonly known as: Bactroban Apply 1 application topically 2 (two) times daily. X 1 week   ondansetron 4 MG tablet Commonly known as: Zofran Take 1 tablet (4 mg total) by mouth every 8 (eight) hours as needed for nausea or vomiting.   oxyCODONE 5 MG immediate release tablet Commonly known as: Oxy IR/ROXICODONE Take 1-2 tablets (5-10 mg total) by mouth every 4 (four) hours as needed for breakthrough pain.   pantoprazole 40 MG tablet Commonly known as: PROTONIX Take 1 tablet (40 mg total) by mouth 2 (two) times daily. 30 minutes before food. Note reduction in frequency   QUEtiapine 25 MG tablet Commonly known as: SEROQUEL Take 1 tablet (25 mg total) by mouth at bedtime.   sucralfate 1 g tablet Commonly known as: CARAFATE TAKE 1 TABLET BY MOUTH 4 TIMES A DAY WITH MEALS AND AT BEDTIME   Teriparatide (Recombinant) 600 MCG/2.4ML Soln Commonly known as: Forteo Inject 0.08 mLs (20 mcg total) into the skin daily.   Tiotropium Bromide Monohydrate 2.5 MCG/ACT Aers Commonly known as: Spiriva Respimat Inhale 2 puffs into the lungs daily. What changed: when to take this   traMADol 50 MG tablet Commonly known as: ULTRAM Take 1 tablet (50 mg total) by mouth every 6 (six) hours as needed.       Allergies:  Allergies  Allergen Reactions   Ceftin [Cefuroxime Axetil] Anaphylaxis   Lisinopril Anaphylaxis   Morphine Other (See Comments)    Per patient, low blood pressure issues that requires action to raise it back up. Can take small infrequent doses   Sulfasalazine Anaphylaxis   Aspirin Other (See Comments)    Sulfasalazine  allergy cross reacts   Antihistamines, Chlorpheniramine-Type Other (See Comments)    Makes pt hyper   Antivert [Meclizine Hcl] Other (See Comments)    Bladder will not empty  Decongestant [Pseudoephedrine Hcl] Other (See Comments)    Makes pt hyper   Doxycycline Other (See Comments)    GI upset   Polymyxin B Other (See Comments)    Medication was in eye drops.   Sulfa Antibiotics Other (See Comments)    Face swelling   Xarelto [Rivaroxaban] Other (See Comments)    Stomach burning, bleeding, and tar in stool   Adhesive [Tape] Rash   Iodine Hives and Rash    Per patient allergy is to contrast dye only, she is able to use betadine scrubs.   Levaquin [Levofloxacin In D5w] Rash   Tetanus Toxoids Rash and Other (See Comments)    Fever and hot to touch at injection site    Family History: Family History  Problem Relation Age of Onset   Rheum arthritis Mother    Asthma Mother    Parkinson's disease Mother    Heart disease Mother    Stroke Mother    Hypertension Mother    Heart attack Father    Heart disease Father    Hypertension Father    Diabetes Son    Gout Son    Asthma Sister    Heart disease Sister    Lung cancer Sister    Heart disease Sister    Heart disease Sister    Breast cancer Sister    Heart attack Sister    Heart disease Brother    Heart disease Maternal Grandmother    Diabetes Maternal Grandmother    Colon cancer Maternal Grandmother    Cancer Maternal Grandmother        Hodgkins lymphoma   Heart disease Brother    Alcohol abuse Brother    Depression Brother    Dementia Son     Social History:  reports that she quit smoking about 44 years ago. Her smoking use included cigarettes. She has a 10.00 pack-year smoking history. She has never used smokeless tobacco. She reports that she does not drink alcohol or use drugs.  ROS: UROLOGY Frequent Urination?: Yes Hard to postpone urination?: Yes Burning/pain with  urination?: No Get up at night to urinate?: Yes Leakage of urine?: No Urine stream starts and stops?: No Trouble starting stream?: No Do you have to strain to urinate?: No Blood in urine?: No Urinary tract infection?: No Sexually transmitted disease?: No Injury to kidneys or bladder?: No Painful intercourse?: No Weak stream?: No Currently pregnant?: No Vaginal bleeding?: No Last menstrual period?: n  Gastrointestinal Nausea?: No Vomiting?: No Indigestion/heartburn?: No Diarrhea?: No Constipation?: No  Constitutional Fever: No Night sweats?: No Weight loss?: No Fatigue?: No  Skin Skin rash/lesions?: No Itching?: No  Eyes Blurred vision?: No Double vision?: No  Ears/Nose/Throat Sore throat?: No Sinus problems?: No  Hematologic/Lymphatic Swollen glands?: No Easy bruising?: No  Cardiovascular Leg swelling?: No Chest pain?: No  Respiratory Cough?: No Shortness of breath?: No  Endocrine Excessive thirst?: No  Musculoskeletal Back pain?: Yes Joint pain?: Yes  Neurological Headaches?: No Dizziness?: No  Psychologic Depression?: No Anxiety?: No  Physical Exam: BP 107/63    Pulse 87    Ht 5' 1.5" (1.562 m)    Wt 163 lb (73.9 kg)    BMI 30.30 kg/m   Constitutional:  Alert and oriented, No acute distress.  In wheelchair today, right arm in sling. HEENT: [place] AT, moist mucus membranes.  Trachea midline, no masses. Cardiovascular: No clubbing, cyanosis, or edema. Respiratory: Normal respiratory effort, no increased work of breathing. Skin: No rashes, bruises or suspicious  lesions. Neurologic: Grossly intact, no focal deficits, moving all 4 extremities. Psychiatric: Normal mood and affect.  Laboratory Data: Lab Results  Component Value Date   WBC 19.9 (H) 07/27/2018   HGB 11.3 (L) 07/27/2018   HCT 36.4 07/27/2018   MCV 90.5 07/27/2018   PLT 180 07/27/2018    Lab Results  Component Value Date   CREATININE 1.14 (H) 07/28/2018    Lab Results    Component Value Date   HGBA1C 5.6 08/06/2017    Pertinent Imaging:. Results for orders placed during the hospital encounter of 10/13/18  US RENAL   Narrative CLINICAL DATA:  Left renal mass, history of right nephrectomy  EXAM: RENAL / URINARY TRACT ULTRASOUND COMPLETE  COMPARISON:  10/05/2017  FINDINGS: Right Kidney:  Surgically absent  Left Kidney:  Renal measurements: 9.6 x 5.0 x 4.4 cm. = volume: 132 mL. Echogenicity within normal limits. No mass or hydronephrosis visualized.  Bladder:  Appears normal for degree of bladder distention.  Left ovarian cyst is again identified and stable measuring 2.8 cm in greatest dimension.  IMPRESSION: Status post right nephrectomy.  No left renal mass is seen.  Stable left ovarian cyst.   Electronically Signed   By: Inez Catalina M.D.   On: 10/14/2018 08:20    Renal ultrasound was personally reviewed today.  Agree with radiologic interpretation.  Unchanged from last year.   Assessment & Plan:    1. Left renal mass Nonspecific findings on CT scan from several years ago  In the interim, she is been followed with serial ultrasounds which are reassuring that there is no significant underlying renal mass  At this point I recommend a renal ultrasound again in 2 years given her age and medical comorbidities, she is agreeable this plan.  2. Urinary frequency Symptoms significantly proved Myrbetriq 50 mg Continue this medication Follow-up in 1 year for symptom recheck  3. Urge incontinence As above  4. CKD (chronic kidney disease), stage III (HCC) Creatinine stable, solitary kidney precautions  Return in about 1 year (around 10/18/2019) for recheck urinary symptoms.  Hollice Espy, MD  [facility] [address], [place], [place] [address] [phone]

## 2018-10-24 ENCOUNTER — Encounter: Payer: Self-pay | Admitting: Psychiatry

## 2018-10-24 ENCOUNTER — Encounter: Payer: Self-pay | Admitting: Gastroenterology

## 2018-10-24 ENCOUNTER — Ambulatory Visit (INDEPENDENT_AMBULATORY_CARE_PROVIDER_SITE_OTHER): Payer: Medicare Other | Admitting: Gastroenterology

## 2018-10-24 ENCOUNTER — Ambulatory Visit (INDEPENDENT_AMBULATORY_CARE_PROVIDER_SITE_OTHER): Payer: Medicare Other | Admitting: Psychiatry

## 2018-10-24 ENCOUNTER — Other Ambulatory Visit: Payer: Self-pay

## 2018-10-24 VITALS — BP 145/63 | HR 69 | Temp 98.0°F | Ht 61.5 in | Wt 172.8 lb

## 2018-10-24 DIAGNOSIS — K589 Irritable bowel syndrome without diarrhea: Secondary | ICD-10-CM

## 2018-10-24 DIAGNOSIS — F32A Depression, unspecified: Secondary | ICD-10-CM

## 2018-10-24 DIAGNOSIS — F329 Major depressive disorder, single episode, unspecified: Secondary | ICD-10-CM

## 2018-10-24 DIAGNOSIS — R1031 Right lower quadrant pain: Secondary | ICD-10-CM | POA: Diagnosis not present

## 2018-10-24 DIAGNOSIS — F39 Unspecified mood [affective] disorder: Secondary | ICD-10-CM

## 2018-10-24 DIAGNOSIS — F419 Anxiety disorder, unspecified: Secondary | ICD-10-CM

## 2018-10-24 DIAGNOSIS — F411 Generalized anxiety disorder: Secondary | ICD-10-CM

## 2018-10-24 IMAGING — US US RENAL
1 series · 14 of 24 positions shown · non-contrast
Comparison: Abdominal CT 06/12/2016

CLINICAL DATA: Right nephrectomy.  Left renal mass.

EXAM:
RENAL / URINARY TRACT ULTRASOUND COMPLETE

[Series 1: us renal · 0.25mm/px · 14 of 24 slices shown]
[im 1/24]
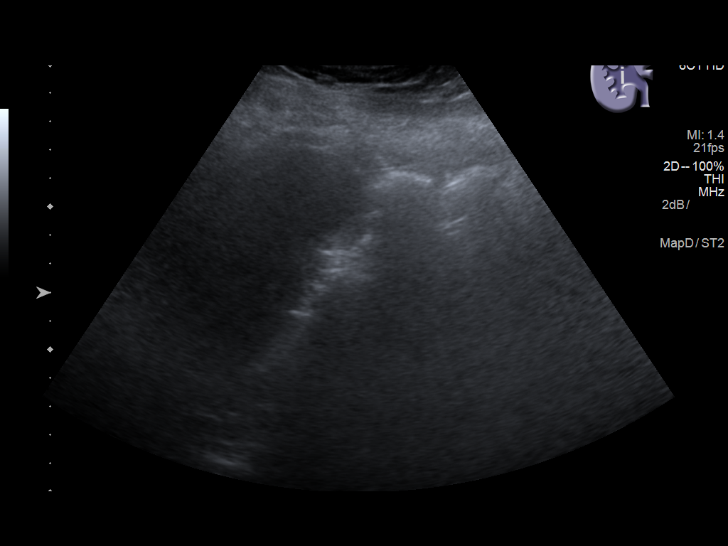
[im 3/24]
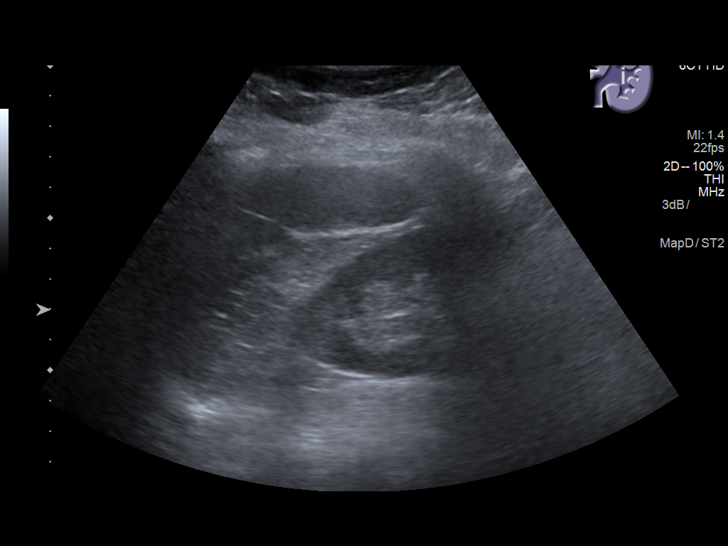
[im 5/24]
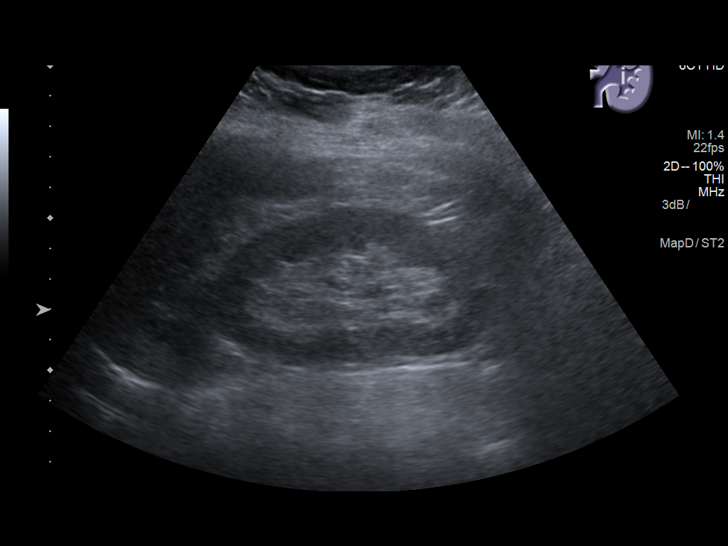
[im 7/24]
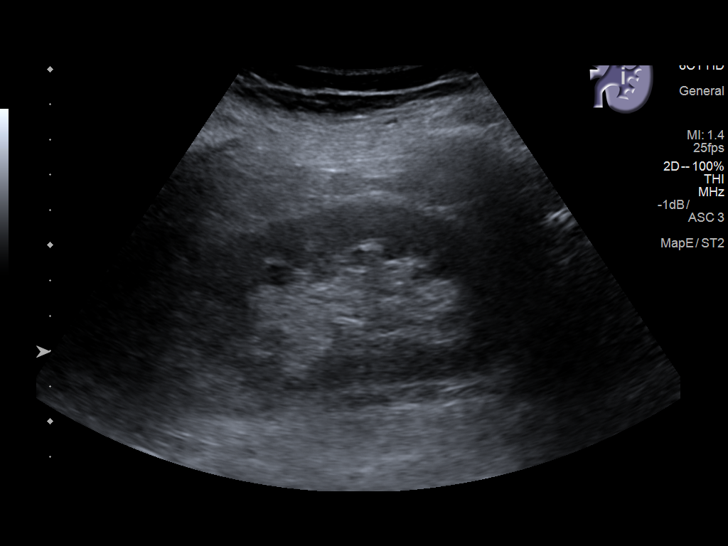
[im 8/24]
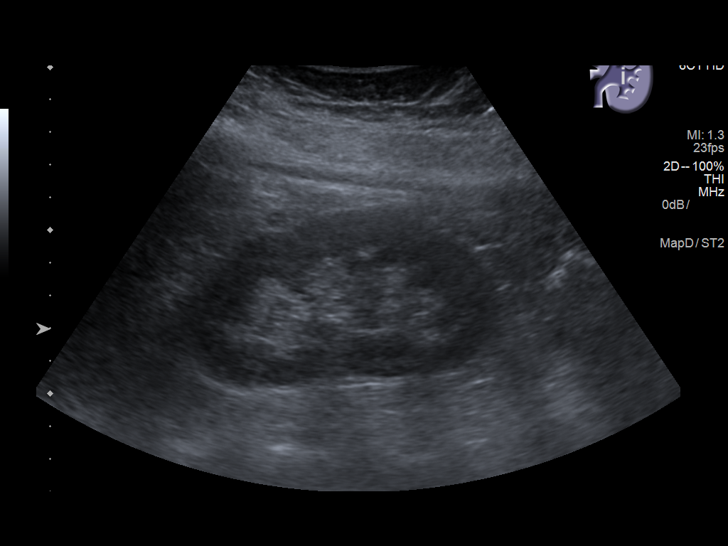
[im 10/24]
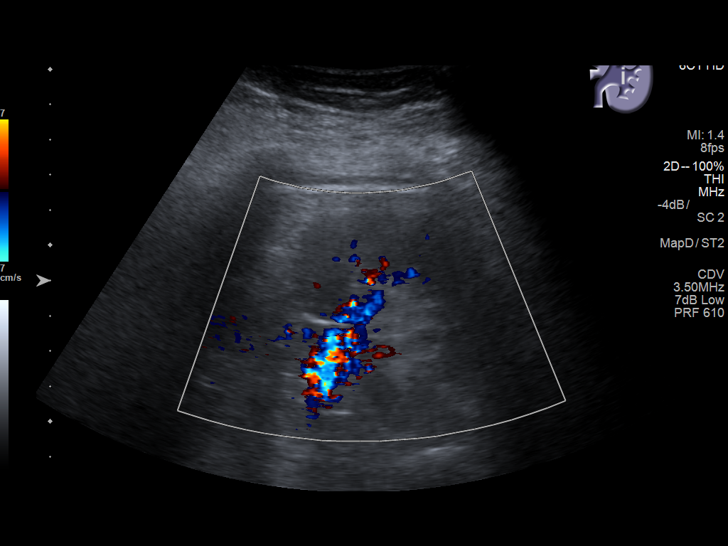
[im 12/24]
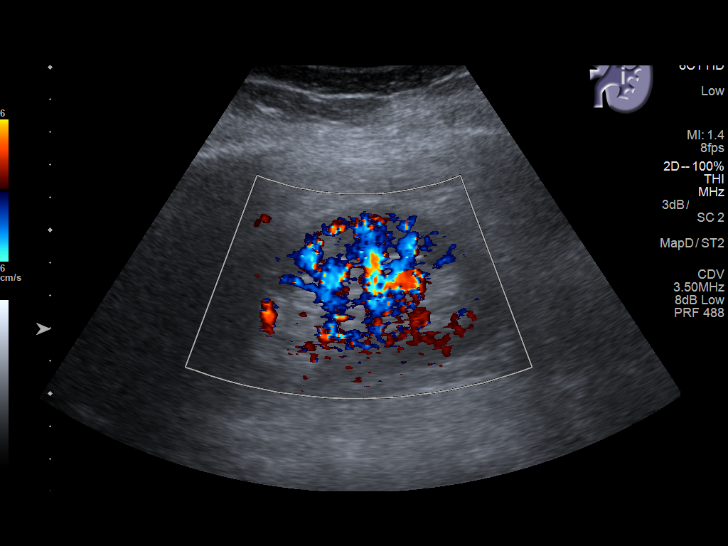
[im 13/24]
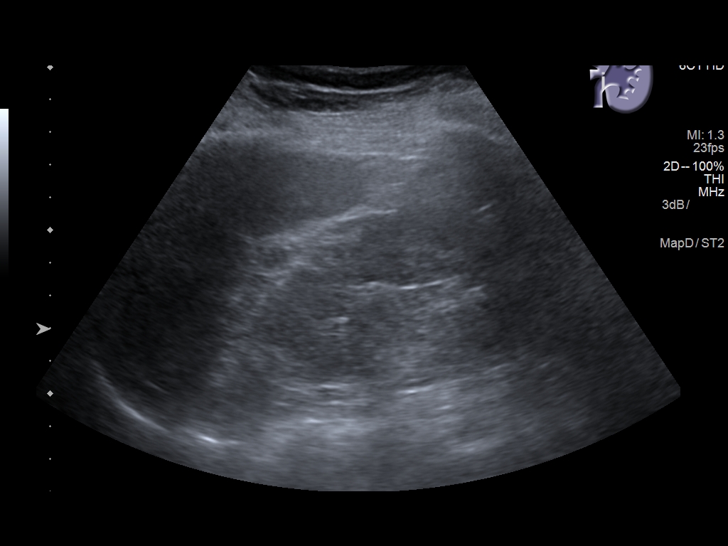
[im 15/24]
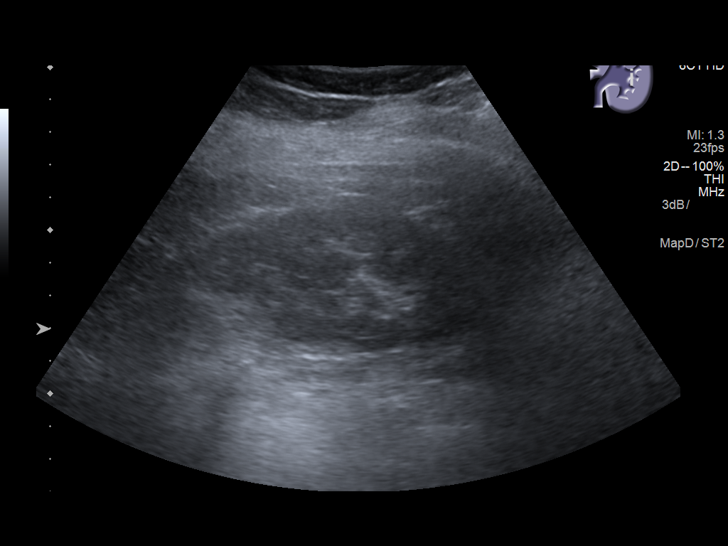
[im 17/24]
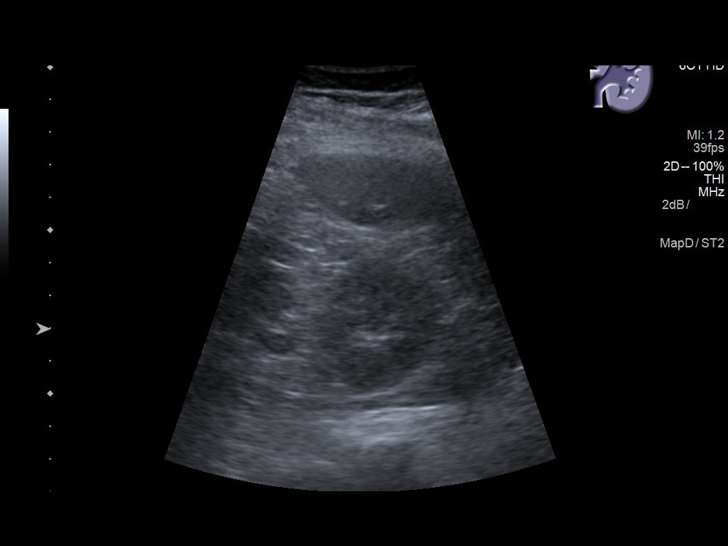
[im 19/24]
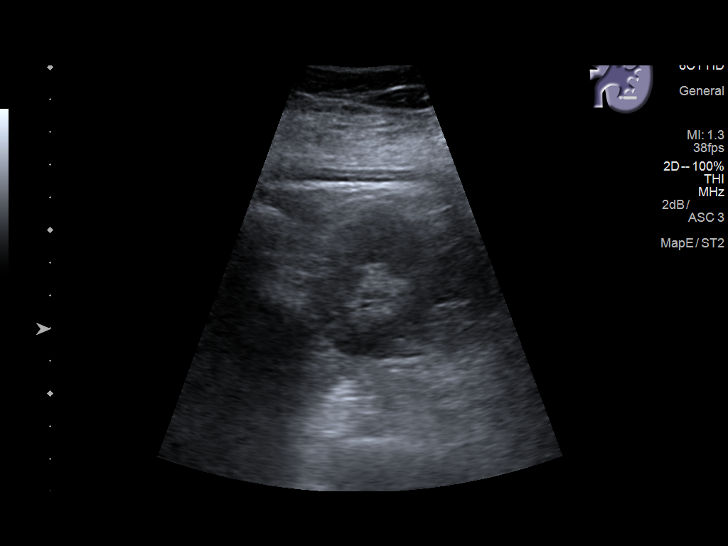
[im 20/24]
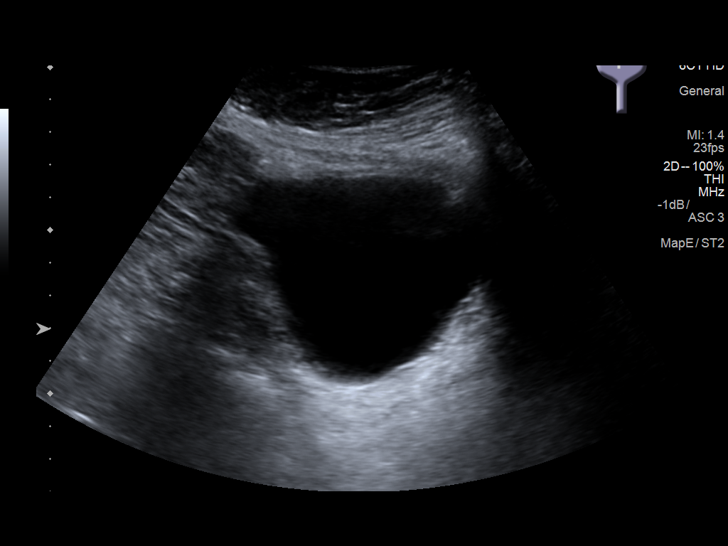
[im 22/24]
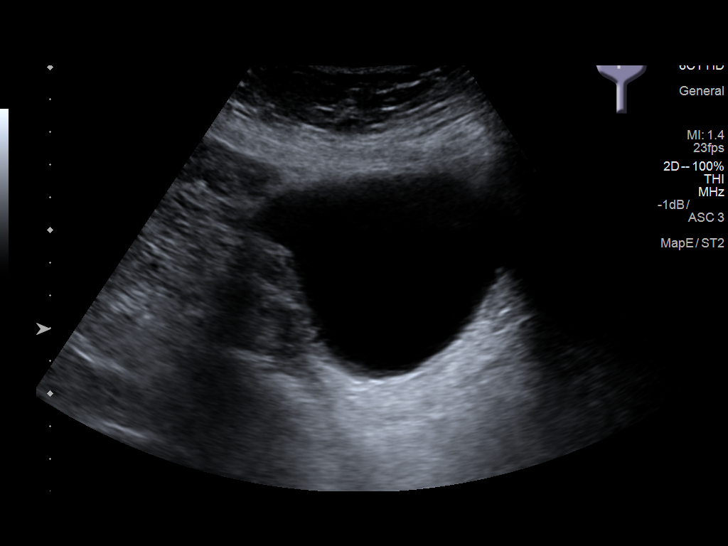
[im 24/24]
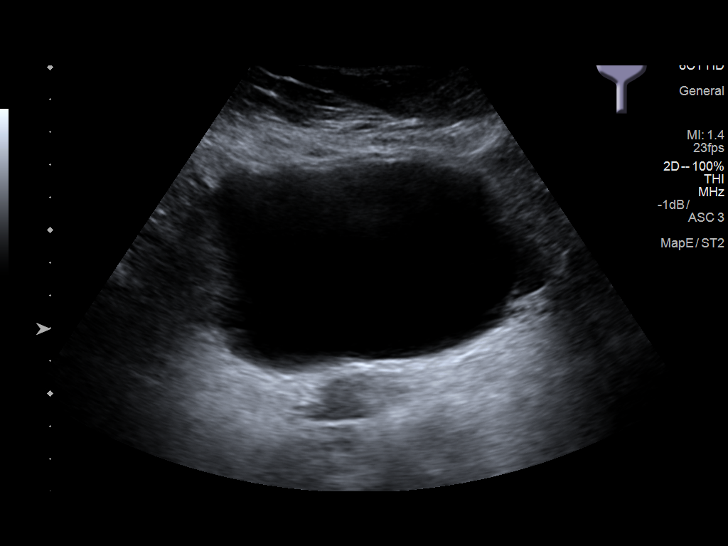

[14 of 24 positions shown; findings below may reference images not displayed]

FINDINGS: Right Kidney:

Surgically absent

Left Kidney:

Length: 10 cm. Echogenicity within normal limits. No mass or
hydronephrosis visualized. Small cystic densities seen on prior CT
are not visible on this study.

Bladder:

Appears normal for degree of bladder distention.
IMPRESSION: 1. Negative left renal sonography. Small left renal cystic densities
on June 2016 CT are not visible today.
2. Right nephrectomy.

## 2018-10-24 MED ORDER — ESCITALOPRAM OXALATE 10 MG PO TABS: 10.0000 mg | ORAL_TABLET | Freq: Every day | ORAL | 3 refills | Status: DC

## 2018-10-24 MED ORDER — ALPRAZOLAM 0.25 MG PO TABS: 0.2500 mg | ORAL_TABLET | Freq: Every evening | ORAL | 2 refills | Status: DC | PRN

## 2018-10-24 MED ORDER — LAMOTRIGINE 100 MG PO TABS: 100.0000 mg | ORAL_TABLET | Freq: Two times a day (BID) | ORAL | 2 refills | Status: DC

## 2018-10-24 MED ORDER — QUETIAPINE FUMARATE 25 MG PO TABS: 25.0000 mg | ORAL_TABLET | Freq: Every day | ORAL | 2 refills | Status: DC

## 2018-10-24 MED ORDER — DICYCLOMINE HCL 10 MG PO CAPS: 10.0000 mg | ORAL_CAPSULE | Freq: Three times a day (TID) | ORAL | 2 refills | Status: DC

## 2018-10-24 NOTE — Progress Notes (Signed)
Patient ID: Kerri Carter, female   DOB: 26-Jun-1940, 78 y.o.   MRN: [id number]    78 year old female with history of anxiety and depression who was followed for medication management. She reported that she has been doing well and she has Been taking alprazolam every night to help her sleep. She reported that other medications are helping with her mood and anxiety symptoms. She is compliant with her medications. She stated that husband and family members and they are supportive. She's trying to stay indoors and staying away from the covid symptoms. Patient reported that she does not feel anxious and angry and her medications are helpful. Patient reported that she does not want to change her medications. We discussed about her medication compliance at length. She denied having any adverse effects at this time.  Plan I will refilled her alprazolam 0.25 mg at bedtime with two refills and the medication was called in the pharmacy. Other medications are also called with refills.   I connected with patient via telemedicine application and verified that I am speaking with the correct person using two identifiers.  I discussed the limitations of evaluation and management by telemedicine and the availability of in person appointments. The patient expressed understanding and agreed to proceed.   I discussed the assessment and treatment plan with the patient. The patient was provided an opportunity to ask questions and all were answered. The patient agreed with the plan and demonstrated an understanding of the instructions.   The patient was advised to call back or seek an in-person evaluation if the symptoms worsen or if the condition fails to improve as anticipated.   I provided 15 minutes of non-face-to-face time during this encounter.

## 2018-10-24 NOTE — Addendum Note (Signed)
Addended by: Dorethea Clan on: 10/24/2018 02:58 PM   Modules accepted: Orders

## 2018-10-24 NOTE — Progress Notes (Signed)
Kerri Bellows MD, MRCP(U.K) [address]  [place]  [place], [place] [address]  Main: [phone]  Fax: [phone]   Gastroenterology Consultation  Referring Provider:     McLean-Scocuzza, Kerri Mackie * Primary Care Physician:  Carter, Kerri Glow, MD Primary Gastroenterologist:  Dr. Jonathon Carter  Reason for Consultation:     Lower abdominal pain        HPI:   Kerri Carter is a 78 y.o. y/o female referred for consultation & management  by Dr. Terese Carter, Kerri Glow, MD.    She was last seen in my office back in 2018 over 2 years back.  She has previously seen Dr Kerri Carter at [place] ,h/o dysphagia, diarrhea, iron deficiency anemia due to small bowel AVM's .  In 2018 she was seen by myself for abdominal pain and anemia.  A CT scan of the abdomen at that point of time showed an adnexal lesion. LFT's in 02/2014 showed an elevated alkaline phosphatase , normal lipase and Hb 11 grams . Iron studies normal .EGD/colonoscopy 04/2016,EGD - mild chronic duodenitis on bx , normal villi. Gastric biopsies showed erosive gastritis , normal esophageal biopsies. Random colon biopsies showed mild active colitis., 3 tubular adenomas were excised. Celiac serology was negative , TSH normal    Interval history 10/13/2016 -10/24/2018 07/27/2018: Hemoglobin 11.3 g.  09/30/2018 hemoglobin 12.4 g. She is here to see me for lower abdominal pain ongoing for 6 months gradually getting worse.  She says the pain is cramping in nature nonradiating on and off usually preceding a bowel movement and relieved after.  Occasionally she has pain after bowel movement as well.  States that the stool is the consistency of pudding.  Denies any constipation.  Has a bowel movement every day.  No other complaints.  Past Medical History:  Diagnosis Date  . Anxiety   . Asthma   . Bipolar disorder ([place])   . Cataract    s/p b/l repair   . Chicken pox   . CKD (chronic kidney disease)   . CKD (chronic kidney disease), stage  III ([place])    a. s/p R nephrectomy.  . Conversion disorder   . COPD (chronic obstructive pulmonary disease) ([place])   . Depression   . Essential hypertension   . GERD (gastroesophageal reflux disease)   . Hyperlipidemia   . Inflammatory arthritis    a. hands/carpal tunnel.  b. Low titer rheumatoid factor. c. Negative anti-CCP antibodies. d. Plaquenil.  . Non-Obstructive CAD    a. 07/2009 Cath ([facility]): nonobs dzs;  b. 03/2011 Cath [facility]): nonobs dzs.  . Osteoarthritis    a. Knees.  . PUD (peptic ulcer disease)   . S/P right hip fracture    11/01/16 s/p repair  . Shoulder pain   . Sleep apnea    no cpap  . Spinal stenosis at L4-L5 level    severe with L4/L5 anterolisthesis grade 1 anterolisthesis   . Toxic maculopathy   . Valvular heart disease    a. 07/2015 Echo: EF 55-60%, Mild AI, AS, MR, and TR.    Past Surgical History:  Procedure Laterality Date  . APPENDECTOMY    . BACK SURGERY    . BUNIONECTOMY Right   . CATARACT EXTRACTION, BILATERAL    . CESAREAN SECTION     x1  . CHOLECYSTECTOMY N/A 05/11/2016   Procedure: LAPAROSCOPIC CHOLECYSTECTOMY;  Surgeon: Florene Glen, MD;  Location: [facility] ORS;  Service: General;  Laterality: N/A;  . COLONOSCOPY WITH PROPOFOL N/A  04/02/2016   Procedure: COLONOSCOPY WITH PROPOFOL;  Surgeon: Kerri Bellows, MD;  Location: [facility] ENDOSCOPY;  Service: Endoscopy;  Laterality: N/A;  . ENDOSCOPIC RETROGRADE CHOLANGIOPANCREATOGRAPHY (ERCP) WITH PROPOFOL N/A 05/08/2016   Procedure: ENDOSCOPIC RETROGRADE CHOLANGIOPANCREATOGRAPHY (ERCP) WITH PROPOFOL;  Surgeon: Lucilla Lame, MD;  Location: [facility] ENDOSCOPY;  Service: Endoscopy;  Laterality: N/A;  . ERCP     with biliary spincterotomy 05/08/16 Dr. Allen Norris for choledocholithiasis   . ESOPHAGEAL DILATION  04/02/2016   Procedure: ESOPHAGEAL DILATION;  Surgeon: Kerri Bellows, MD;  Location: [facility] ENDOSCOPY;  Service: Endoscopy;;  . ESOPHAGOGASTRODUODENOSCOPY (EGD) WITH PROPOFOL N/A 04/02/2016   Procedure: ESOPHAGOGASTRODUODENOSCOPY  (EGD) WITH PROPOFOL;  Surgeon: Kerri Bellows, MD;  Location: [facility] ENDOSCOPY;  Service: Endoscopy;  Laterality: N/A;  . HIP ARTHROPLASTY Right 11/01/2016   Procedure: ARTHROPLASTY BIPOLAR HIP (HEMIARTHROPLASTY);  Surgeon: Corky Mull, MD;  Location: [facility] ORS;  Service: Orthopedics;  Laterality: Right;  . 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 osteoporosis     noted DEXA 08/19/16   . REPLACEMENT TOTAL KNEE Right   . REVERSE SHOULDER ARTHROPLASTY Right 11/04/2017   Procedure: REVERSE SHOULDER ARTHROPLASTY;  Surgeon: Corky Mull, MD;  Location: [facility] ORS;  Service: Orthopedics;  Laterality: Right;  . REVERSE SHOULDER ARTHROPLASTY Left 07/26/2018   Procedure: REVERSE SHOULDER ARTHROPLASTY;  Surgeon: Corky Mull, MD;  Location: [facility] ORS;  Service: Orthopedics;  Laterality: Left;  . TONSILLECTOMY    . TOTAL HIP ARTHROPLASTY  12/10/11   [facility] left hip  . TOTAL HIP ARTHROPLASTY Bilateral   . TUBAL LIGATION      Prior to Admission medications   Medication Sig Start Date End Date Taking? Authorizing Provider  Adalimumab (HUMIRA PEN) 40 MG/0.4ML PNKT Inject 40 mg into the skin every 14 (fourteen) days.     [provider]  albuterol (VENTOLIN HFA) 108 (90 Base) MCG/ACT inhaler Inhale 2 puffs into the lungs every 6 (six) hours as needed. Patient taking differently: Inhale 2 puffs into the lungs every 6 (six) hours as needed for wheezing or shortness of breath.  11/25/15   Juanito Doom, MD  ALPRAZolam Duanne Moron) 0.25 MG tablet Take 1 tablet (0.25 mg total) by mouth at bedtime as needed for anxiety. 10/24/18   Rainey Pines, MD  benzonatate (TESSALON) 200 MG capsule Take 1 capsule (200 mg total) by mouth 3 (three) times daily as needed for cough. 07/12/18   Parrett, Fonnie Mu, NP  BYSTOLIC 10 MG tablet TAKE 1 TABLET BY MOUTH EVERY DAY 09/26/18   Juanito Doom, MD  clotrimazole (LOTRIMIN) 1 % cream Apply 1 application topically 2 (two) times daily as  needed (for irritation). 12/22/17   Carter, Kerri Glow, MD  escitalopram (LEXAPRO) 10 MG tablet Take 1 tablet (10 mg total) by mouth daily. 10/24/18   Rainey Pines, MD  fluticasone furoate-vilanterol (BREO ELLIPTA) 200-25 MCG/INH AEPB Inhale 1 puff into the lungs daily. Rinse mouth 03/25/18   Carter, Kerri Glow, MD  furosemide (LASIX) 20 MG tablet Take 1 tablet (20 mg total) by mouth daily as needed. Take x  5 days with weight gain and leg swelling then daily prn 07/04/18   Carter, Kerri Glow, MD  gabapentin (NEURONTIN) 300 MG capsule Take 2-3 capsules (600-900 mg total) by mouth See admin instructions. Take 600 mg by mouth in the morning and 900 mg at bedtime 08/01/18   Carter, Kerri Glow, MD  ipratropium-albuterol (DUONEB) 0.5-2.5 (3) MG/3ML SOLN Take 3 mLs  by nebulization every 8 (eight) hours as needed. 03/25/18   Carter, Kerri Glow, MD  lamoTRIgine (LAMICTAL) 100 MG tablet Take 1 tablet (100 mg total) by mouth 2 (two) times daily. 10/24/18   Rainey Pines, MD  leflunomide (ARAVA) 20 MG tablet Take 1 tablet (20 mg total) by mouth daily. 09/30/17   Carter, Kerri Glow, MD  lovastatin (MEVACOR) 40 MG tablet Take 1 tablet (40 mg total) by mouth at bedtime. 01/21/18   Carter, Kerri Glow, MD  Melatonin 10 MG TABS Take 10 mg by mouth at bedtime. 08/09/17   Rainey Pines, MD  methylPREDNISolone (MEDROL) 4 MG tablet Take 1 tablet (4 mg total) by mouth daily. Medrol Dosepak as directed 09/26/18   Duanne Guess, PA-C  mirabegron ER (MYRBETRIQ) 50 MG TB24 tablet Take 1 tablet (50 mg total) by mouth at bedtime. 09/19/18   Zara Council A, PA-C  montelukast (SINGULAIR) 10 MG tablet Take 1 tablet (10 mg total) by mouth daily. 06/14/18   Carter, Kerri Glow, MD  multivitamin-lutein [facility]) CAPS capsule Take 1 capsule by mouth 2 (two) times daily.    [provider]  mupirocin ointment (BACTROBAN) 2 % Apply 1 application topically 2 (two) times daily. X 1 week  10/19/17   Carter, Kerri Glow, MD  ondansetron (ZOFRAN) 4 MG tablet Take 1 tablet (4 mg total) by mouth every 8 (eight) hours as needed for nausea or vomiting. 03/10/17   Carter, Kerri Glow, MD  oxyCODONE (OXY IR/ROXICODONE) 5 MG immediate release tablet Take 1-2 tablets (5-10 mg total) by mouth every 4 (four) hours as needed for breakthrough pain. 07/27/18   Lattie Corns, PA-C  pantoprazole (PROTONIX) 40 MG tablet Take 1 tablet (40 mg total) by mouth 2 (two) times daily. 30 minutes before food. Note reduction in frequency 04/29/18   Crecencio Mc, MD  QUEtiapine (SEROQUEL) 25 MG tablet Take 1 tablet (25 mg total) by mouth at bedtime. 10/24/18   Rainey Pines, MD  sucralfate (CARAFATE) 1 g tablet TAKE 1 TABLET BY MOUTH 4 TIMES A DAY WITH MEALS AND AT BEDTIME 05/17/18   Kerri Bellows, MD  Teriparatide, Recombinant, (FORTEO) 600 MCG/2.4ML SOLN Inject 0.08 mLs (20 mcg total) into the skin daily. 11/03/16   Max Sane, MD  Tiotropium Bromide Monohydrate (SPIRIVA RESPIMAT) 2.5 MCG/ACT AERS Inhale 2 puffs into the lungs daily. Patient taking differently: Inhale 2 puffs into the lungs at bedtime.  02/24/18   Carter, Kerri Glow, MD  traMADol (ULTRAM) 50 MG tablet Take 1 tablet (50 mg total) by mouth every 6 (six) hours as needed. 07/27/18   Lattie Corns, PA-C    Family History  Problem Relation Age of Onset  . Rheum arthritis Mother   . Asthma Mother   . Parkinson's disease Mother   . Heart disease Mother   . Stroke Mother   . Hypertension Mother   . Heart attack Father   . Heart disease Father   . Hypertension Father   . Diabetes Son   . Gout Son   . Asthma Sister   . Heart disease Sister   . Lung cancer Sister   . Heart disease Sister   . Heart disease Sister   . Breast cancer Sister   . Heart attack Sister   . Heart disease Brother   . Heart disease Maternal Grandmother   . Diabetes Maternal Grandmother   . Colon cancer Maternal Grandmother   . Cancer Maternal  Grandmother  Hodgkins lymphoma  . Heart disease Brother   . Alcohol abuse Brother   . Depression Brother   . Dementia Son      Social History   Tobacco Use  . Smoking status: Former Smoker    Packs/day: 0.50    Years: 20.00    Pack years: 10.00    Types: Cigarettes    Quit date: 02/02/1974    Years since quitting: 44.7  . Smokeless tobacco: Never Used  Substance Use Topics  . Alcohol use: No  . Drug use: No    Allergies as of 10/24/2018 - Review Complete 10/24/2018  Allergen Reaction Noted  . Ceftin [cefuroxime axetil] Anaphylaxis 09/08/2011  . Lisinopril Anaphylaxis 09/08/2011  . Morphine Other (See Comments) 02/25/2012  . Sulfasalazine Anaphylaxis 03/08/2014  . Aspirin Other (See Comments) 09/15/2016  . Antihistamines, chlorpheniramine-type Other (See Comments) 09/08/2011  . Antivert [meclizine hcl] Other (See Comments) 09/08/2011  . Decongestant [pseudoephedrine hcl] Other (See Comments) 09/08/2011  . Doxycycline Other (See Comments) 09/08/2011  . Polymyxin b Other (See Comments) 02/28/2016  . Sulfa antibiotics Other (See Comments) 04/25/2014  . Xarelto [rivaroxaban] Other (See Comments) 01/19/2012  . Adhesive [tape] Rash 09/08/2011  . Iodine Hives and Rash 09/08/2011  . Levaquin [levofloxacin in d5w] Rash 09/08/2011  . Tetanus toxoids Rash and Other (See Comments) 09/08/2011    Review of Systems:    All systems reviewed and negative except where noted in HPI.   Physical Exam:  There were no vitals taken for this visit. No LMP recorded. Patient is postmenopausal. Psych:  Alert and cooperative. Normal mood and affect. General:   Alert,  Well-developed, well-nourished, pleasant and cooperative in NAD Head:  Normocephalic and atraumatic. Eyes:  Sclera clear, no icterus.   Conjunctiva pink. Ears:  Normal auditory acuity. Nose:  No deformity, discharge, or lesions. Mouth:  No deformity or lesions,oropharynx pink & moist. Neck:  Supple; no masses or  thyromegaly. Lungs:  Respirations even and unlabored.  Clear throughout to auscultation.   No wheezes, crackles, or rhonchi. No acute distress. Heart:  Regular rate and rhythm; no murmurs, clicks, rubs, or gallops. Abdomen:  Normal bowel sounds.  No bruits.  Soft, non-tender and non-distended without masses, hepatosplenomegaly or hernias noted.  No guarding or rebound tenderness.    Neurologic:  Alert and oriented x3;  grossly normal neurologically. Skin:  Intact without significant lesions or rashes. No jaundice. Lymph Nodes:  No significant cervical adenopathy. Psych:  Alert and cooperative. Normal mood and affect.  Imaging Studies: Dg Shoulder Right  Result Date: 09/26/2018 CLINICAL DATA:  Shoulder pain EXAM: RIGHT SHOULDER - 2+ VIEW COMPARISON:  11/04/2017 FINDINGS: Right shoulder replacement with normal alignment.  No fracture. IMPRESSION: Right shoulder replacement.  No acute osseous abnormality. Electronically Signed   By: Donavan Foil M.D.   On: 09/26/2018 21:27   US Renal  Result Date: 10/14/2018 CLINICAL DATA:  Left renal mass, history of right nephrectomy EXAM: RENAL / URINARY TRACT ULTRASOUND COMPLETE COMPARISON:  10/05/2017 FINDINGS: Right Kidney: Surgically absent Left Kidney: Renal measurements: 9.6 x 5.0 x 4.4 cm. = volume: 132 mL. Echogenicity within normal limits. No mass or hydronephrosis visualized. Bladder: Appears normal for degree of bladder distention. Left ovarian cyst is again identified and stable measuring 2.8 cm in greatest dimension. IMPRESSION: Status post right nephrectomy. No left renal mass is seen. Stable left ovarian cyst. Electronically Signed   By: Inez Catalina M.D.   On: 10/14/2018 08:20   Ct Chest High Resolution  Result  Date: 10/13/2018 CLINICAL DATA:  78 year old female with history of cough, wheezing and shortness of breath. Evaluate for interstitial lung disease. EXAM: CT CHEST WITHOUT CONTRAST TECHNIQUE: Multidetector CT imaging of the chest was  performed following the standard protocol without intravenous contrast. High resolution imaging of the lungs, as well as inspiratory and expiratory imaging, was performed. COMPARISON:  Chest CT 12/04/2014. FINDINGS: Cardiovascular: Heart size is mildly enlarged. There is no significant pericardial fluid, thickening or pericardial calcification. There is aortic atherosclerosis, as well as atherosclerosis of the great vessels of the mediastinum and the coronary arteries, including calcified atherosclerotic plaque in the left main, left anterior descending, left circumflex and right coronary arteries. Calcifications of the aortic valve and of the inferior mitral annulus. Mediastinum/Nodes: No pathologically enlarged mediastinal or hilar lymph nodes. Please note that accurate exclusion of hilar adenopathy is limited on noncontrast CT scans. Esophagus is unremarkable in appearance. No axillary lymphadenopathy. Lungs/Pleura: High-resolution images demonstrates some very mild areas of peripheral predominant ground-glass attenuation and septal thickening scattered throughout the mid to lower lungs bilaterally. No traction bronchiectasis, bronchiolectasis or honeycombing. Inspiratory and expiratory imaging is unremarkable. 5 mm left upper lobe pulmonary nodule (axial image 48 of series 3), stable dating back to 2013, definitively benign. No other larger more suspicious appearing pulmonary nodules or masses are noted. No acute consolidative airspace disease. No pleural effusions. Upper Abdomen: Unremarkable. Musculoskeletal: Status post bilateral shoulder arthroplasty. There are no aggressive appearing lytic or blastic lesions noted in the visualized portions of the skeleton. IMPRESSION: 1. The appearance of the lungs does suggest very early or mild interstitial lung disease, with a spectrum of findings considered indeterminate for usual interstitial pneumonia (UIP) per current ATS guidelines. This could reflect very mild  nonspecific interstitial pneumonia (NSIP), although very early UIP is not excluded. Repeat high-resolution chest CT is recommended in 12 months to assess for temporal changes in the appearance of the lung parenchyma. 2. Aortic atherosclerosis, in addition to left main and 3 vessel coronary artery disease. Please note that although the presence of coronary artery calcium documents the presence of coronary artery disease, the severity of this disease and any potential stenosis cannot be assessed on this non-gated CT examination. Assessment for potential risk factor modification, dietary therapy or pharmacologic therapy may be warranted, if clinically indicated. 3. There are calcifications of the aortic valve and mitral annulus. Echocardiographic correlation for evaluation of potential valvular dysfunction may be warranted if clinically indicated. 4. Mild cardiomegaly. Aortic Atherosclerosis (ICD10-I70.0). Electronically Signed   By: Vinnie Langton M.D.   On: 10/13/2018 17:50    Assessment and Plan:   Kerri Carter is a 78 y.o. y/o female here to see me for lower abdominal discomfort.  Ongoing for about 6 months.  Features are suggestive of IBS mixed.  The pain is relieved after bowel movement.   1.  Trial of dicyclomine, high-fiber diet, fiber pills supplements, samples will be provided. 2.  If no better in 4 to 6 weeks we will consider a CT scan of the abdomen. 3.  Carafate refill Follow up in 6 weeks  Dr Kerri Bellows MD,MRCP(U.K)

## 2018-10-27 ENCOUNTER — Telehealth: Payer: Self-pay | Admitting: Gastroenterology

## 2018-10-27 ENCOUNTER — Other Ambulatory Visit: Payer: Self-pay | Admitting: Gastroenterology

## 2018-10-27 ENCOUNTER — Other Ambulatory Visit: Payer: Self-pay

## 2018-10-27 DIAGNOSIS — R1031 Right lower quadrant pain: Secondary | ICD-10-CM

## 2018-10-27 MED ORDER — SUCRALFATE 1 G PO TABS: 1.0000 g | ORAL_TABLET | Freq: Four times a day (QID) | ORAL | 3 refills | Status: DC

## 2018-10-27 NOTE — Telephone Encounter (Signed)
Spoke with pt and informed her that we have resent the carafate refill to her preferred pharmacy.

## 2018-10-27 NOTE — Telephone Encounter (Signed)
Pt left vm she needs [place] to refill rx caraphate pt would like a call

## 2018-10-28 DIAGNOSIS — S42123A Displaced fracture of acromial process, unspecified shoulder, initial encounter for closed fracture: Secondary | ICD-10-CM

## 2018-10-28 HISTORY — DX: Displaced fracture of acromial process, unspecified shoulder, initial encounter for closed fracture: S42.123A

## 2018-10-31 ENCOUNTER — Encounter: Payer: Self-pay | Admitting: Pulmonary Disease

## 2018-10-31 ENCOUNTER — Other Ambulatory Visit: Payer: Self-pay

## 2018-10-31 ENCOUNTER — Ambulatory Visit (INDEPENDENT_AMBULATORY_CARE_PROVIDER_SITE_OTHER): Payer: Medicare Other | Admitting: Pulmonary Disease

## 2018-10-31 VITALS — BP 126/68 | HR 72 | Temp 97.8°F | Ht 61.5 in | Wt 176.4 lb

## 2018-10-31 DIAGNOSIS — K219 Gastro-esophageal reflux disease without esophagitis: Secondary | ICD-10-CM

## 2018-10-31 DIAGNOSIS — J449 Chronic obstructive pulmonary disease, unspecified: Secondary | ICD-10-CM | POA: Diagnosis not present

## 2018-10-31 DIAGNOSIS — R05 Cough: Secondary | ICD-10-CM

## 2018-10-31 DIAGNOSIS — R059 Cough, unspecified: Secondary | ICD-10-CM

## 2018-10-31 DIAGNOSIS — J849 Interstitial pulmonary disease, unspecified: Secondary | ICD-10-CM | POA: Diagnosis not present

## 2018-10-31 MED ORDER — AEROCHAMBER MV MISC: 0 refills | Status: DC

## 2018-10-31 MED ORDER — ACETYLCYSTEINE 600 MG PO CAPS: 1.0000 | ORAL_CAPSULE | Freq: Two times a day (BID) | ORAL | 2 refills | Status: DC

## 2018-10-31 MED ORDER — PANTOPRAZOLE SODIUM 40 MG PO TBEC: 40.0000 mg | DELAYED_RELEASE_TABLET | Freq: Two times a day (BID) | ORAL | 1 refills | Status: DC

## 2018-10-31 MED ORDER — BREO ELLIPTA 200-25 MCG/INH IN AEPB: 1.0000 | INHALATION_SPRAY | Freq: Every day | RESPIRATORY_TRACT | 11 refills | Status: DC

## 2018-10-31 NOTE — Progress Notes (Signed)
Subjective:    Patient ID: Kerri Carter, female    DOB: [birth year], 78 y.o.   MRN: [id number]  HPI The patient is a 78 year old remote former smoker, who has been following with Dr. Simonne Maffucci for the issue of COPD.  She initially was seen at this office in July 2013 and subsequently continue to follow with Dr. Lake Bells at the [facility] office.  She has now transition her care here as Dr. Lake Bells has transition to critical care medicine only.  Recall that she has asthma/COPD overlap syndrome.  Recently also noted to have mild interstitial lung disease likely associated with underlying rheumatoid arthritis.  She has been plagued with a cough since November 2019.  This has been an ongoing issue for her.  She is to have pulmonary function testing in December.  Since her prior visit she has not had any new complaint or change in the character of her cough.  For the details of her prior visit please see my note from 31 August.   Review of Systems  A 10 point review of systems was performed and it is as noted above otherwise negative.     Objective:   Physical Exam Vitals and nursing note reviewed.  Constitutional:      General: She is not in acute distress.    Appearance: She is overweight. She is not ill-appearing.     Comments: In wheelchair.  HENT:     Head: Normocephalic and atraumatic.     Right Ear: External ear normal.     Left Ear: External ear normal.     Nose:     Comments: Nose/mouth/throat not examined due to masking requirements for COVID 19. Eyes:     Extraocular Movements: Extraocular movements intact.     Conjunctiva/sclera: Conjunctivae normal.  Neck:     Thyroid: No thyromegaly.     Trachea: Trachea and phonation normal.  Cardiovascular:     Rate and Rhythm: Normal rate and regular rhythm.     Pulses: Normal pulses.     Heart sounds: S1 normal and S2 normal. Murmur present. Systolic (Left sternal border) murmur present with a grade of 1/6.  Pulmonary:      Effort: Pulmonary effort is normal.     Breath sounds: No wheezing or rhonchi.     Comments: Coarse breath sounds throughout, faint dry crackles at the bases. Abdominal:     General: Abdomen is protuberant. There is no distension.  Musculoskeletal:     Cervical back: Normal range of motion and neck supple.     Right lower leg: No edema.     Left lower leg: No edema.     Comments: Limited range of motion due to RA.  Lymphadenopathy:     Cervical: No cervical adenopathy.  Skin:    General: Skin is warm and dry.  Neurological:     General: No focal deficit present.     Mental Status: She is alert and oriented to person, place, and time.  Psychiatric:        Mood and Affect: Mood normal.        Behavior: Behavior normal. Behavior is cooperative.      CT scan of the chest high resolution shows very early interstitial type changes she will need a follow-up in a year's time.  This may be related to her rheumatoid disease.     Assessment & Plan:  1.  Chronic cough: Patient has noticed worsening issues with cough since November 2019.  She is on medications that can aggravate issues with recurrent bronchitis and worsen respiratory issues (RA medications), she has also been on Forteo which had as a side effect potential for cough.  In addition, she has been switched to a dry powder inhaler from metered-dose inhaler.  Dry powders tend to have more issues with side effect of cough.  Additionally her issues could be due to poorly compensated asthma/COPD overlap syndrome.  She is to have PFTs in December, these have been delayed due to the COVID-19 pandemic.  She has had symptoms of worsening gastroesophageal reflux and we will treat as below.  2.  Asthma COPD overlap syndrome: As noted above she is to continue [employer] and Spiriva. She has been instructed on proper rinsing of her mouth after the use of Breo.  We will consider switching to a metered-dose inhaler or nebulizer form of delivery  if she continues to have issues with cough.  She was provided spacer for use with her rescue inhaler which she has difficulties using.  3.  Interstitial lung disease: Radiographically she appears to have some mild increased interstitial changes.    These were confirmed by CT high-res.  These may be related to rheumatoid arthritis.    Continue management of rheumatoid arthritis is the management for interstitial lung disease related to rheumatoid arthritis.  The changes are minimal.  Given that she is having cough recommended use of N-acetylcysteine (NAC) 600 mg p.o. twice a day.  She will need repeat high-res CT in a year's time.  4.  Gastroesophageal reflux disease: She continues to be symptomatic despite use of PPIs will increase PPI to twice a day to see if this relieves her gastroesophageal reflux symptoms and her cough.  If she fails this she will need evaluation by GI.   Follow-up will be in 2 to 3 months time she is to contact us prior to that time should any new difficulties arise.   Renold Don, MD [facility]   This note was dictated using voice recognition software/Dragon.  Despite best efforts to proofread, errors can occur which can change the meaning.  Any change was purely unintentional.

## 2018-10-31 NOTE — Patient Instructions (Signed)
1.  Continue Breo and Spiriva.  2.  Increase Protonix to twice a day to see if this will help with your cough.  3.  We sent in a prescription for N-acetylcysteine (acetylcysteine) 1 capsule twice a day.  4.  Make sure you follow-up with your heart doctor at [facility].  5.  We will see you back in 4 to 6 weeks time.

## 2018-11-02 ENCOUNTER — Telehealth: Payer: Self-pay | Admitting: Pulmonary Disease

## 2018-11-02 MED ORDER — ACETYLCYSTEINE 600 MG PO CAPS: 1.0000 | ORAL_CAPSULE | Freq: Two times a day (BID) | ORAL | 2 refills | Status: DC

## 2018-11-02 NOTE — Telephone Encounter (Signed)
Left message for pt

## 2018-11-02 NOTE — Telephone Encounter (Signed)
Called and spoke to pt. Pt states that Rx for Acetylcysteine was not received by pharmacy. Rx has been resent to pharmacy.  Nothing further is needed.

## 2018-11-03 IMAGING — DX DG SHOULDER 2+V PORT*R*
2 series · 2 of 2 positions shown · non-contrast
Comparison: MRI right shoulder 06/30/2017.

CLINICAL DATA: Status post right shoulder replacement today.

EXAM:
PORTABLE RIGHT SHOULDER

[shoulder ap]
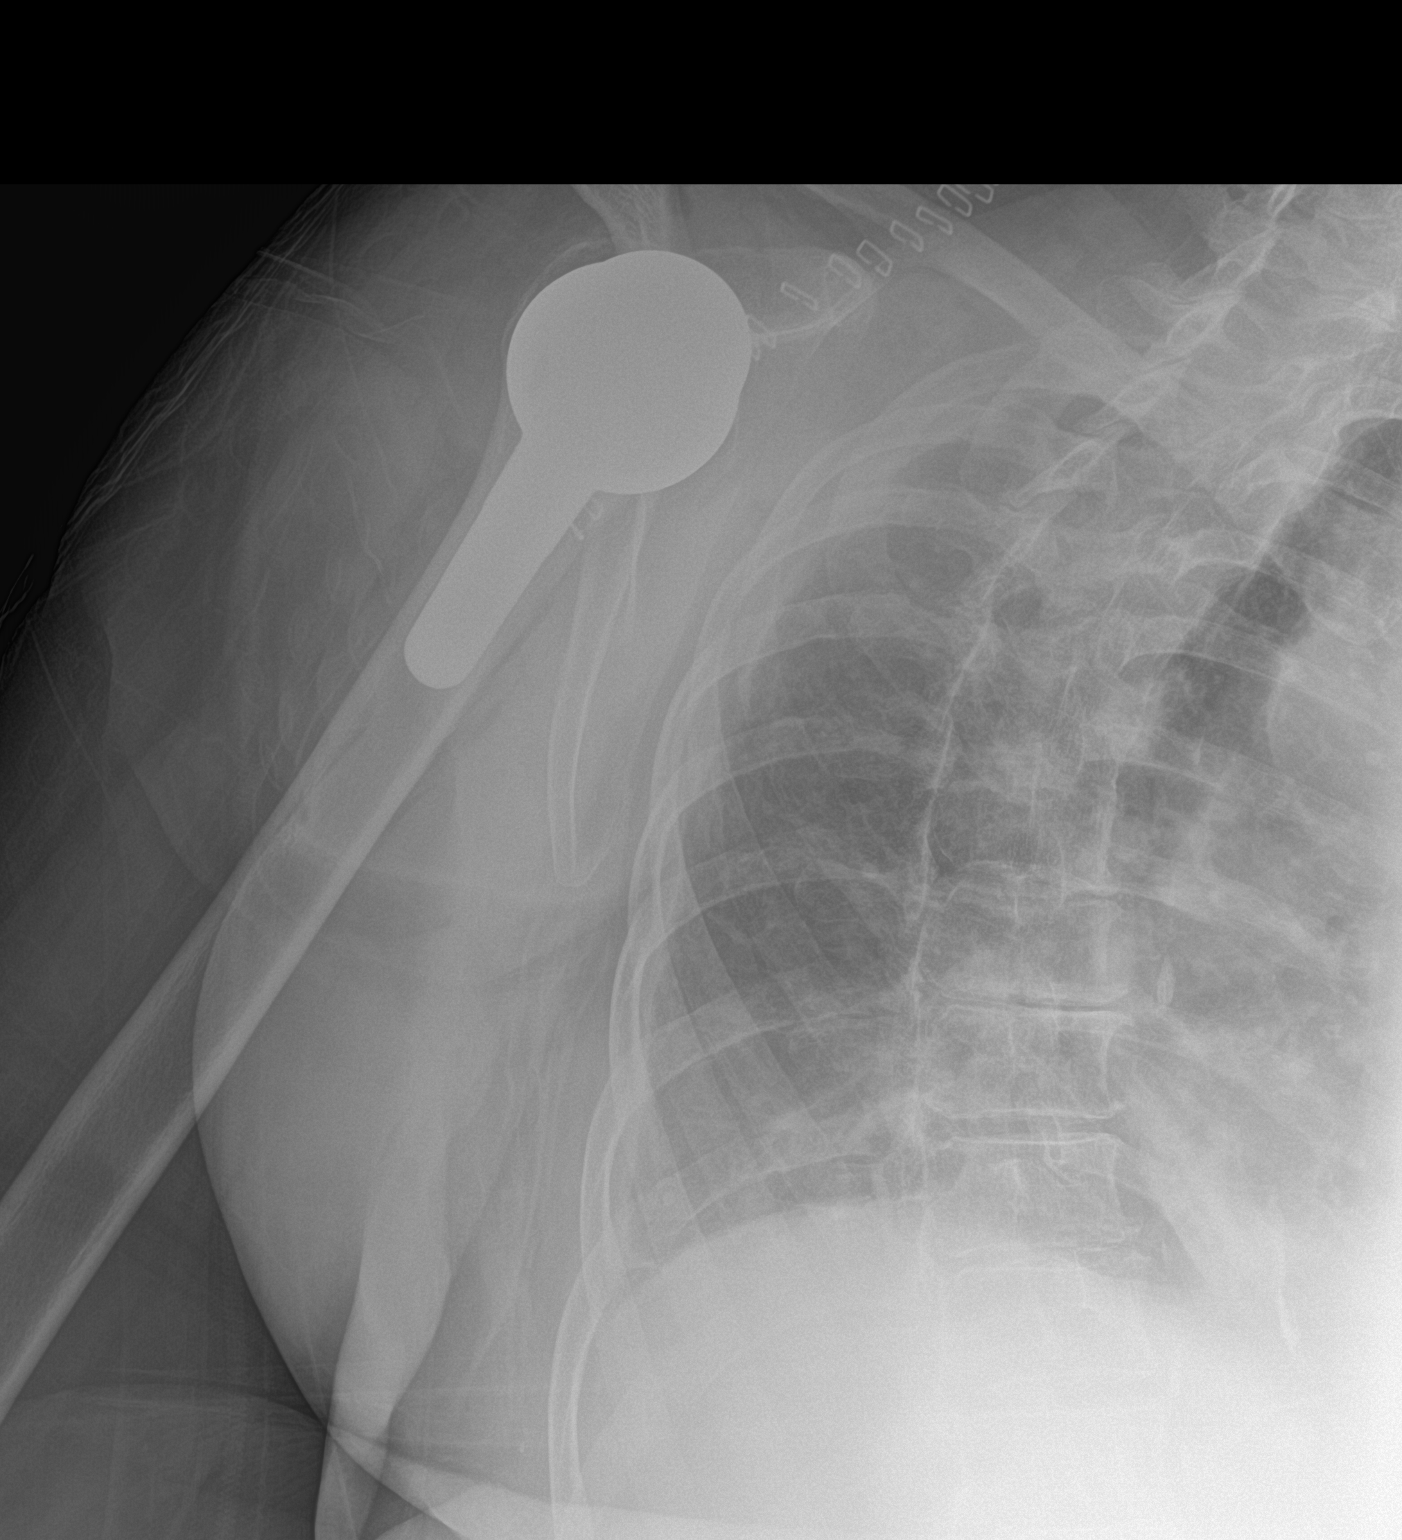

[shoulder obl]
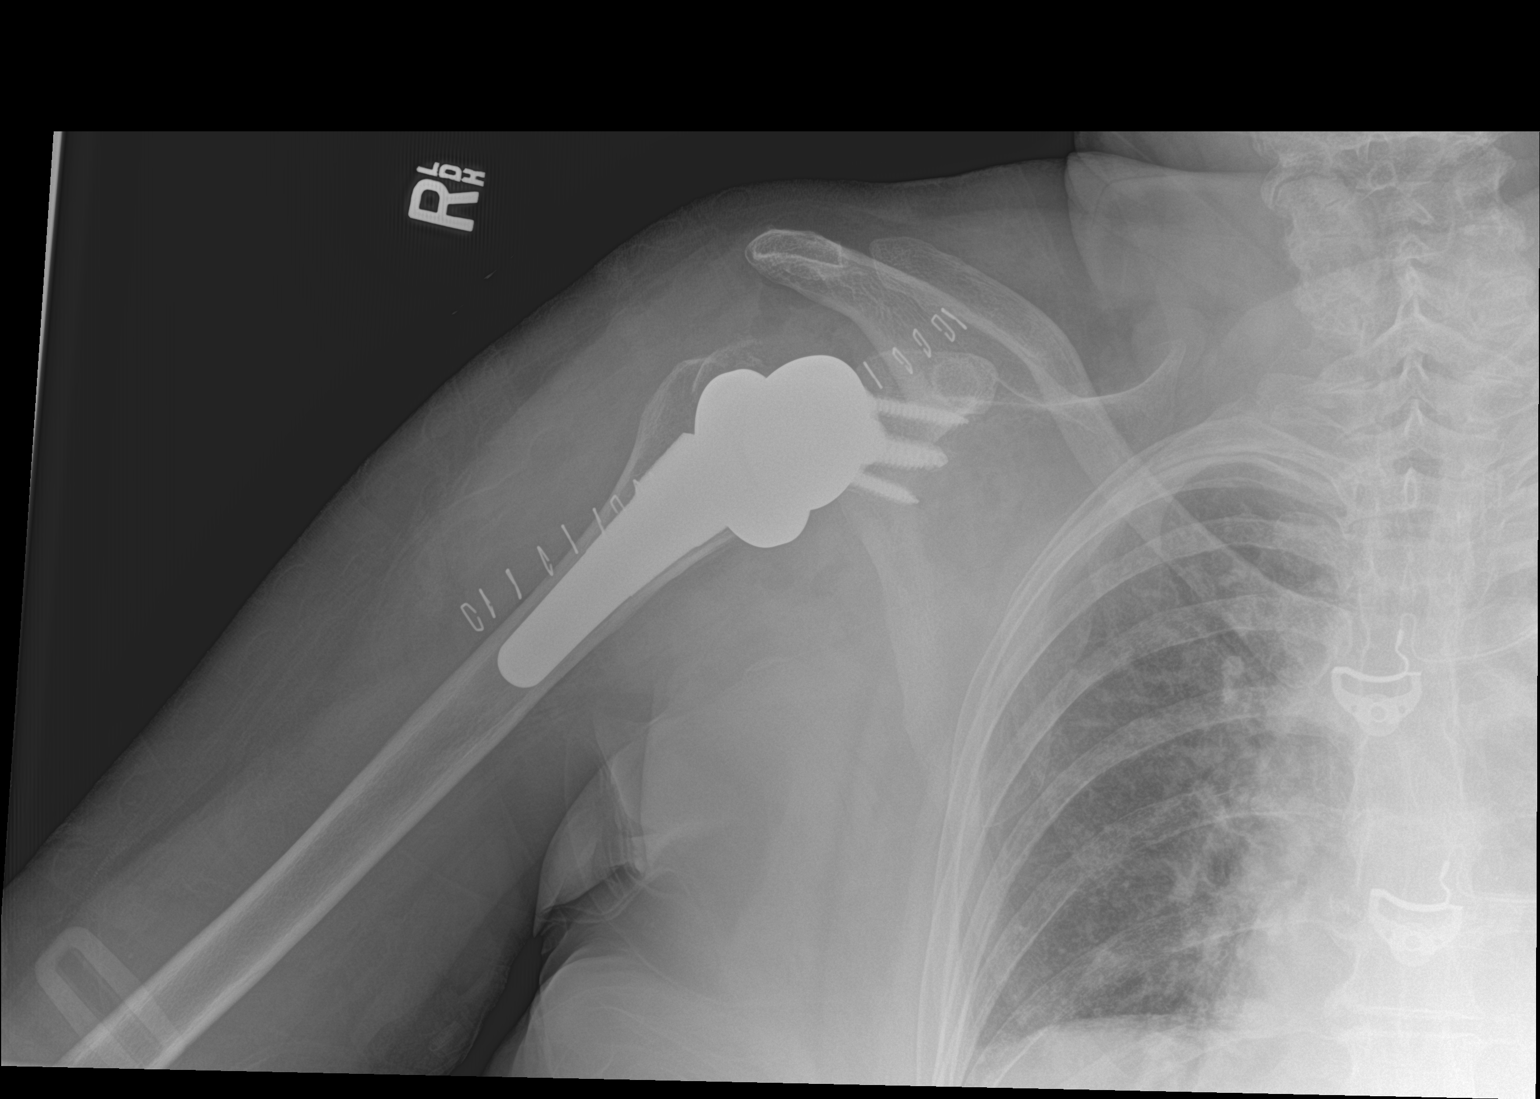

[2 of 2 positions shown; findings below may reference images not displayed]

FINDINGS: New reverse shoulder arthroplasty is in place. The device is
located. No fracture is identified. Surgical staples noted.
IMPRESSION: Status post right shoulder replacement.  No acute finding.

## 2018-11-04 ENCOUNTER — Telehealth: Payer: Self-pay | Admitting: Pulmonary Disease

## 2018-11-04 NOTE — Telephone Encounter (Signed)
Left message for pt

## 2018-11-04 NOTE — Telephone Encounter (Signed)
Pt returning call

## 2018-11-07 ENCOUNTER — Ambulatory Visit: Payer: Medicare Other | Admitting: Psychiatry

## 2018-11-07 NOTE — Telephone Encounter (Signed)
Pt is aware that Acetylcystenine 600mg  can be purchased at [facility].  Pt voiced her understanding and had no further questions.

## 2018-11-22 ENCOUNTER — Other Ambulatory Visit: Payer: Self-pay

## 2018-11-23 ENCOUNTER — Ambulatory Visit (INDEPENDENT_AMBULATORY_CARE_PROVIDER_SITE_OTHER): Payer: Medicare Other

## 2018-11-23 DIAGNOSIS — Z0000 Encounter for general adult medical examination without abnormal findings: Secondary | ICD-10-CM

## 2018-11-23 NOTE — Progress Notes (Signed)
Subjective:   Kerri Carter is a 78 y.o. female who presents for Medicare Annual (Subsequent) preventive examination.  Review of Systems:  No ROS.  Medicare Wellness Virtual Visit.  Visual/audio telehealth visit, UTA vital signs.   See social history for additional risk factors.   Cardiac Risk Factors include: advanced age (>7men, >38 women);hypertension     Objective:     Vitals: There were no vitals taken for this visit.  There is no height or weight on file to calculate BMI.  Advanced Directives 11/23/2018 07/26/2018 07/26/2018 07/22/2018 04/14/2018 11/04/2017 11/04/2017  Does Patient Have a Medical Advance Directive? Yes Yes Yes Yes Yes Yes Yes  Type of Paramedic of [place];Living will [place];Living will [place];Living will [place];Living will - Living will Living will  Does patient want to make changes to medical advance directive? No - Patient declined No - Patient declined No - Patient declined - No - Patient declined No - Patient declined No - Patient declined  Copy of [place] in Chart? No - copy requested No - copy requested No - copy requested - - - -  Would patient like information on creating a medical advance directive? - - - - - - -  Some encounter information is confidential and restricted. Go to Review Flowsheets activity to see all data.    Tobacco Social History   Tobacco Use  Smoking Status Former Smoker  . Packs/day: 0.50  . Years: 20.00  . Pack years: 10.00  . Types: Cigarettes  . Quit date: 02/02/1974  . Years since quitting: 44.8  Smokeless Tobacco Never Used     Counseling given: Not Answered   Clinical Intake:  Pre-visit preparation completed: Yes        Diabetes: No  How often do you need to have someone help you when you read instructions, pamphlets, or other written materials from your doctor or pharmacy?: 2 - Rarely  Interpreter  Needed?: No     Past Medical History:  Diagnosis Date  . Anxiety   . Asthma   . Bipolar disorder ([place])   . Cataract    s/p b/l repair   . Chicken pox   . CKD (chronic kidney disease)   . CKD (chronic kidney disease), stage III    a. s/p R nephrectomy.  . Conversion disorder   . COPD (chronic obstructive pulmonary disease) ([place])   . Depression   . Essential hypertension   . GERD (gastroesophageal reflux disease)   . Hyperlipidemia   . Inflammatory arthritis    a. hands/carpal tunnel.  b. Low titer rheumatoid factor. c. Negative anti-CCP antibodies. d. Plaquenil.  . Non-Obstructive CAD    a. 07/2009 Cath ([facility]): nonobs dzs;  b. 03/2011 Cath [facility]): nonobs dzs.  . Osteoarthritis    a. Knees.  . PUD (peptic ulcer disease)   . S/P right hip fracture    11/01/16 s/p repair  . Shoulder pain   . Sleep apnea    no cpap  . Spinal stenosis at L4-L5 level    severe with L4/L5 anterolisthesis grade 1 anterolisthesis   . Toxic maculopathy   . Valvular heart disease    a. 07/2015 Echo: EF 55-60%, Mild AI, AS, MR, and TR.   Past Surgical History:  Procedure Laterality Date  . APPENDECTOMY    . BACK SURGERY    . BUNIONECTOMY Right   . CATARACT EXTRACTION, BILATERAL    .  CESAREAN SECTION     x1  . CHOLECYSTECTOMY N/A 05/11/2016   Procedure: LAPAROSCOPIC CHOLECYSTECTOMY;  Surgeon: Florene Glen, MD;  Location: [facility] ORS;  Service: General;  Laterality: N/A;  . COLONOSCOPY WITH PROPOFOL N/A 04/02/2016   Procedure: COLONOSCOPY WITH PROPOFOL;  Surgeon: Jonathon Bellows, MD;  Location: [facility] ENDOSCOPY;  Service: Endoscopy;  Laterality: N/A;  . ENDOSCOPIC RETROGRADE CHOLANGIOPANCREATOGRAPHY (ERCP) WITH PROPOFOL N/A 05/08/2016   Procedure: ENDOSCOPIC RETROGRADE CHOLANGIOPANCREATOGRAPHY (ERCP) WITH PROPOFOL;  Surgeon: Lucilla Lame, MD;  Location: [facility] ENDOSCOPY;  Service: Endoscopy;  Laterality: N/A;  . ERCP     with biliary spincterotomy 05/08/16 Dr. Allen Norris for choledocholithiasis   . ESOPHAGEAL DILATION   04/02/2016   Procedure: ESOPHAGEAL DILATION;  Surgeon: Jonathon Bellows, MD;  Location: [facility] ENDOSCOPY;  Service: Endoscopy;;  . ESOPHAGOGASTRODUODENOSCOPY (EGD) WITH PROPOFOL N/A 04/02/2016   Procedure: ESOPHAGOGASTRODUODENOSCOPY (EGD) WITH PROPOFOL;  Surgeon: Jonathon Bellows, MD;  Location: [facility] ENDOSCOPY;  Service: Endoscopy;  Laterality: N/A;  . HIP ARTHROPLASTY Right 11/01/2016   Procedure: ARTHROPLASTY BIPOLAR HIP (HEMIARTHROPLASTY);  Surgeon: Corky Mull, MD;  Location: [facility] ORS;  Service: Orthopedics;  Laterality: Right;  . 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 osteoporosis     noted DEXA 08/19/16   . REPLACEMENT TOTAL KNEE Right   . REVERSE SHOULDER ARTHROPLASTY Right 11/04/2017   Procedure: REVERSE SHOULDER ARTHROPLASTY;  Surgeon: Corky Mull, MD;  Location: [facility] ORS;  Service: Orthopedics;  Laterality: Right;  . REVERSE SHOULDER ARTHROPLASTY Left 07/26/2018   Procedure: REVERSE SHOULDER ARTHROPLASTY;  Surgeon: Corky Mull, MD;  Location: [facility] ORS;  Service: Orthopedics;  Laterality: Left;  . TONSILLECTOMY    . TOTAL HIP ARTHROPLASTY  12/10/11   [facility] left hip  . TOTAL HIP ARTHROPLASTY Bilateral   . TUBAL LIGATION     Family History  Problem Relation Age of Onset  . Rheum arthritis Mother   . Asthma Mother   . Parkinson's disease Mother   . Heart disease Mother   . Stroke Mother   . Hypertension Mother   . Heart attack Father   . Heart disease Father   . Hypertension Father   . Diabetes Son   . Gout Son   . Asthma Sister   . Heart disease Sister   . Lung cancer Sister   . Heart disease Sister   . Heart disease Sister   . Breast cancer Sister   . Heart attack Sister   . Heart disease Brother   . Heart disease Maternal Grandmother   . Diabetes Maternal Grandmother   . Colon cancer Maternal Grandmother   . Cancer Maternal Grandmother        Hodgkins lymphoma  . Heart disease Brother   . Alcohol abuse Brother   . Depression  Brother   . Dementia Son    Social History   Socioeconomic History  . Marital status: Married    Spouse name: richard  . Number of children: 2  . Years of education: Some Coll  . Highest education level: Some college, no degree  Occupational History  . Occupation: Retired    Comment: retired  Scientific laboratory technician  . Financial resource strain: Not hard at all  . Food insecurity    Worry: Never true    Inability: Never true  . Transportation needs    Medical: No    Non-medical: No  Tobacco Use  . Smoking status: Former Smoker    Packs/day: 0.50  Years: 20.00    Pack years: 10.00    Types: Cigarettes    Quit date: 02/02/1974    Years since quitting: 44.8  . Smokeless tobacco: Never Used  Substance and Sexual Activity  . Alcohol use: No  . Drug use: No  . Sexual activity: Not Currently  Lifestyle  . Physical activity    Days per week: 0 days    Minutes per session: 0 min  . Stress: Not on file  Relationships  . Social connections    Talks on phone: More than three times a week    Gets together: Never    Attends religious service: More than 4 times per year    Active member of club or organization: No    Attends meetings of clubs or organizations: Never    Relationship status: Married  Other Topics Concern  . Not on file  Social History Narrative   Lives at home in [place] with her husband and grandson.   Right-handed.   6 cups coffee per day.    Outpatient Encounter Medications as of 11/23/2018  Medication Sig  . Acetylcysteine 600 MG CAPS Take 1 capsule (600 mg total) by mouth 2 (two) times daily with a meal.  . Adalimumab (HUMIRA PEN) 40 MG/0.4ML PNKT Inject 40 mg into the skin every 14 (fourteen) days.   Marland Kitchen albuterol (VENTOLIN HFA) 108 (90 Base) MCG/ACT inhaler Inhale 2 puffs into the lungs every 6 (six) hours as needed. (Patient taking differently: Inhale 2 puffs into the lungs every 6 (six) hours as needed for wheezing or shortness of breath. )  . ALPRAZolam  (XANAX) 0.25 MG tablet Take 1 tablet (0.25 mg total) by mouth at bedtime as needed for anxiety.  . benzonatate (TESSALON) 200 MG capsule Take 1 capsule (200 mg total) by mouth 3 (three) times daily as needed for cough.  . BYSTOLIC 10 MG tablet TAKE 1 TABLET BY MOUTH EVERY DAY  . clotrimazole (LOTRIMIN) 1 % cream Apply 1 application topically 2 (two) times daily as needed (for irritation).  Marland Kitchen dicyclomine (BENTYL) 10 MG capsule Take 1 capsule (10 mg total) by mouth 4 (four) times daily -  before meals and at bedtime.  Marland Kitchen escitalopram (LEXAPRO) 10 MG tablet Take 1 tablet (10 mg total) by mouth daily.  . fluticasone furoate-vilanterol (BREO ELLIPTA) 200-25 MCG/INH AEPB Inhale 1 puff into the lungs daily.  Marland Kitchen gabapentin (NEURONTIN) 300 MG capsule Take 2-3 capsules (600-900 mg total) by mouth See admin instructions. Take 600 mg by mouth in the morning and 900 mg at bedtime  . ipratropium-albuterol (DUONEB) 0.5-2.5 (3) MG/3ML SOLN Take 3 mLs by nebulization every 8 (eight) hours as needed.  . lamoTRIgine (LAMICTAL) 100 MG tablet Take 1 tablet (100 mg total) by mouth 2 (two) times daily.  Marland Kitchen leflunomide (ARAVA) 20 MG tablet Take 1 tablet (20 mg total) by mouth daily.  Marland Kitchen lovastatin (MEVACOR) 40 MG tablet Take 1 tablet (40 mg total) by mouth at bedtime.  . Melatonin 10 MG TABS Take 10 mg by mouth at bedtime.  . mirabegron ER (MYRBETRIQ) 50 MG TB24 tablet Take 1 tablet (50 mg total) by mouth at bedtime.  . montelukast (SINGULAIR) 10 MG tablet Take 1 tablet (10 mg total) by mouth daily.  . multivitamin-lutein (OCUVITE-LUTEIN) CAPS capsule Take 1 capsule by mouth 2 (two) times daily.  . mupirocin ointment (BACTROBAN) 2 % Apply 1 application topically 2 (two) times daily. X 1 week  . ondansetron (ZOFRAN) 4 MG tablet Take  1 tablet (4 mg total) by mouth every 8 (eight) hours as needed for nausea or vomiting.  Marland Kitchen oxyCODONE (OXY IR/ROXICODONE) 5 MG immediate release tablet Take 1-2 tablets (5-10 mg total) by mouth every  4 (four) hours as needed for breakthrough pain.  . pantoprazole (PROTONIX) 40 MG tablet Take 1 tablet (40 mg total) by mouth 2 (two) times daily. 30 minutes before food. Note reduction in frequency  . QUEtiapine (SEROQUEL) 25 MG tablet Take 1 tablet (25 mg total) by mouth at bedtime.  Marland Kitchen Spacer/Aero-Holding Chambers (AEROCHAMBER MV) inhaler Use as instructed  . sucralfate (CARAFATE) 1 g tablet Take 1 tablet (1 g total) by mouth 4 (four) times daily. TAKE 1 TABLET BY MOUTH 4 TIMES A DAY WITH MEALS AND AT BEDTIME  . Teriparatide, Recombinant, (FORTEO) 600 MCG/2.4ML SOLN Inject 0.08 mLs (20 mcg total) into the skin daily.  . Tiotropium Bromide Monohydrate (SPIRIVA RESPIMAT) 2.5 MCG/ACT AERS Inhale 2 puffs into the lungs daily. (Patient taking differently: Inhale 2 puffs into the lungs at bedtime. )  . traMADol (ULTRAM) 50 MG tablet Take 1 tablet (50 mg total) by mouth every 6 (six) hours as needed.   No facility-administered encounter medications on file as of 11/23/2018.     Activities of Daily Living In your present state of health, do you have any difficulty performing the following activities: 11/23/2018 07/26/2018  Hearing? N N  Vision? Y N  Comment Macular degeneration -  Difficulty concentrating or making decisions? N Y  Walking or climbing stairs? Y Y  Comment Unsteady gait -  Dressing or bathing? N N  Doing errands, shopping? Y N  Comment She does not drive Facilities manager and eating ? Y -  Using the Toilet? N -  In the past six months, have you accidently leaked urine? Y -  Comment Managed with daily brief -  Do you have problems with loss of bowel control? N -  Managing your Medications? N -  Managing your Finances? N -  Housekeeping or managing your Housekeeping? Y -  Some recent data might be hidden    Patient Care Team: McLean-Scocuzza, Nino Glow, MD as PCP - General (Internal Medicine)    Assessment:   This is a routine wellness examination for Kerri Carter.  I connected  with patient 11/23/18 at 12:00 PM EDT by an audio enabled telemedicine application and verified that I am speaking with the correct person using two identifiers. Patient stated full name and DOB. Patient gave permission to continue with virtual visit. Patient's location was at home and Nurse's location was at [place] office.   Health Maintenance Due: -Hgb A1c- no longer pre-diabetic 08/06/17 Update all pending maintenance due as appropriate.   See completed HM at the end of note.   Eye: Visual acuity not assessed. Virtual visit. Cataracts extracted. Macular degeneration. Followed by their ophthalmologist and retina specialist.    Dental: Dentures- yes  Hearing: Demonstrates normal hearing during visit.  Safety:  Patient feels safe at home- yes Patient does have smoke detectors at home- yes Patient does wear sunscreen or protective clothing when in direct sunlight - yes Patient does wear seat belt when in a moving vehicle - yes Patient drives- no Adequate lighting in walkways free from debris- yes Grab bars and handrails used as appropriate- yes Ambulates with no assistive device. Refuses to use cane.  Cell phone on person when ambulating outside of the home- yes  Social: Alcohol intake - no  Smoking history- former  Smokers in home? none Illicit drug use? none  Depression: PHQ 2 &9 complete. See screening below. Denies irritability, anhedonia, sadness/tearfullness.  Stable.   Falls: See screening below.  Refuses to use cane.   Medication: Taking as directed and without issues.   Covid-19: Precautions and sickness symptoms discussed. Wears mask, social distancing, hand hygiene as appropriate.   Activities of Daily Living Patient denies needing assistance with: household chores, feeding themselves, getting from bed to chair, getting to the toilet, bathing/showering, dressing, managing money, or preparing meals.  Assisted by granddaughter with housekeeping and cooking as  needed.   Memory: Patient is alert. Correctly identified the president of the [place], season and recall 2/3.  BMI- discussed the importance of a healthy diet, water intake and the benefits of aerobic exercise.  Educational material provided.  Physical activity- no routine.  Diet: low sodium Water: little intake Caffeine: 6 cups  Other Providers Patient Care Team: McLean-Scocuzza, Nino Glow, MD as PCP - General (Internal Medicine)  Exercise Activities and Dietary recommendations Current Exercise Habits: The patient does not participate in regular exercise at present  Goals    . Follow up with Primary Care Provider     As needed       Fall Risk Fall Risk  11/23/2018 07/07/2018 02/24/2018 10/19/2017 07/19/2017  Falls in the past year? 1 1 1  No No  Number falls in past yr: 1 0 1 - -  Injury with Fall? 1 0 1 - -  Risk Factor Category  - - - - -  Comment - - - - -  Risk for fall due to : History of fall(s) - - - -  Follow up Falls prevention discussed;Education provided - - - -   Timed Get Up and Go performed: no, virtual visit  Depression Screen PHQ 2/9 Scores 11/23/2018 07/07/2018 02/24/2018 10/19/2017  PHQ - 2 Score 0 0 0 0  Exception Documentation - - - -  Some encounter information is confidential and restricted. Go to Review Flowsheets activity to see all data.     Cognitive Function MMSE - Mini Mental State Exam 03/01/2017 02/28/2016 07/11/2015  Orientation to time 5 5 4   Orientation to Place 5 5 5   Registration 3 3 3   Attention/ Calculation 5 5 5   Recall 3 3 1   Language- name 2 objects 2 2 2   Language- repeat 1 1 1   Language- follow 3 step command 3 3 3   Language- read & follow direction 1 1 1   Write a sentence 1 1 1   Copy design 1 1 1   Total score 30 30 27         Immunization History  Administered Date(s) Administered  . Fluad Quad(high Dose 65+) 10/03/2018  . Influenza Split 10/04/2012, 10/25/2013, 10/28/2016  . Influenza Whole 11/03/2011  . Influenza, High Dose  Seasonal PF 10/04/2015, 11/03/2016  . Influenza,inj,Quad PF,6+ Mos 10/31/2014  . Influenza-Unspecified 01/02/2010, 11/24/2011, 10/21/2013, 09/28/2017  . Pneumococcal Conjugate-13 10/21/2013  . Pneumococcal Polysaccharide-23 11/07/2015   Screening Tests Health Maintenance  Topic Date Due  . HEMOGLOBIN A1C  02/06/2018  . INFLUENZA VACCINE  Completed  . DEXA SCAN  Completed  . PNA vac Low Risk Adult  Completed  . FOOT EXAM  Discontinued  . OPHTHALMOLOGY EXAM  Discontinued  . URINE MICROALBUMIN  Discontinued       Plan:   Keep all routine maintenance appointments.   Follow up with your doctor 11/24/18.  Medicare Attestation I have personally reviewed: The patient's medical and social  history Their use of alcohol, tobacco or illicit drugs Their current medications and supplements The patient's functional ability including ADLs,fall risks, home safety risks, cognitive, and hearing and visual impairment Diet and physical activities Evidence for depression   In addition, I have reviewed and discussed with patient certain preventive protocols, quality metrics, and best practice recommendations. A written personalized care plan for preventive services as well as general preventive health recommendations were provided to patient via mail.     Varney Biles, LPN  [phone]

## 2018-11-23 NOTE — Patient Instructions (Addendum)
  Ms. Piskor , Thank you for taking time to come for your Medicare Wellness Visit. I appreciate your ongoing commitment to your health goals. Please review the following plan we discussed and let me know if I can assist you in the future.   These are the goals we discussed: Goals    . Follow up with Primary Care Provider     As needed       This is a list of the screening recommended for you and due dates:  Health Maintenance  Topic Date Due  . Hemoglobin A1C  02/06/2018  . Flu Shot  Completed  . DEXA scan (bone density measurement)  Completed  . Pneumonia vaccines  Completed  . Complete foot exam   Discontinued  . Eye exam for diabetics  Discontinued  . Urine Protein Check  Discontinued

## 2018-11-24 ENCOUNTER — Encounter: Payer: Self-pay | Admitting: Internal Medicine

## 2018-11-24 ENCOUNTER — Other Ambulatory Visit: Payer: Self-pay

## 2018-11-24 ENCOUNTER — Ambulatory Visit (INDEPENDENT_AMBULATORY_CARE_PROVIDER_SITE_OTHER): Payer: Medicare Other | Admitting: Internal Medicine

## 2018-11-24 ENCOUNTER — Ambulatory Visit: Payer: Medicare Other

## 2018-11-24 VITALS — BP 130/68 | HR 78 | Temp 97.7°F | Ht 61.5 in | Wt 176.4 lb

## 2018-11-24 DIAGNOSIS — Z1283 Encounter for screening for malignant neoplasm of skin: Secondary | ICD-10-CM | POA: Diagnosis not present

## 2018-11-24 DIAGNOSIS — Z1329 Encounter for screening for other suspected endocrine disorder: Secondary | ICD-10-CM

## 2018-11-24 DIAGNOSIS — E611 Iron deficiency: Secondary | ICD-10-CM

## 2018-11-24 DIAGNOSIS — S42301G Unspecified fracture of shaft of humerus, right arm, subsequent encounter for fracture with delayed healing: Secondary | ICD-10-CM

## 2018-11-24 DIAGNOSIS — L989 Disorder of the skin and subcutaneous tissue, unspecified: Secondary | ICD-10-CM

## 2018-11-24 DIAGNOSIS — M069 Rheumatoid arthritis, unspecified: Secondary | ICD-10-CM | POA: Diagnosis not present

## 2018-11-24 DIAGNOSIS — D72829 Elevated white blood cell count, unspecified: Secondary | ICD-10-CM

## 2018-11-24 DIAGNOSIS — Z1231 Encounter for screening mammogram for malignant neoplasm of breast: Secondary | ICD-10-CM

## 2018-11-24 DIAGNOSIS — M810 Age-related osteoporosis without current pathological fracture: Secondary | ICD-10-CM

## 2018-11-24 DIAGNOSIS — R739 Hyperglycemia, unspecified: Secondary | ICD-10-CM

## 2018-11-24 DIAGNOSIS — Z13220 Encounter for screening for lipoid disorders: Secondary | ICD-10-CM

## 2018-11-24 DIAGNOSIS — E559 Vitamin D deficiency, unspecified: Secondary | ICD-10-CM

## 2018-11-24 NOTE — Progress Notes (Signed)
Chief Complaint  Patient presents with  . Follow-up   F/u  1. Review of echo 11/22/18 worsening MR moderate pt will f/u with [facility] cardiology    NORMAL LEFT VENTRICULAR SYSTOLIC FUNCTION WITH MILD LVH   NORMAL RIGHT VENTRICULAR SYSTOLIC FUNCTION   VALVULAR REGURGITATION: MODERATE AR, MODERATE MR, TRIVIAL TR   NO VALVULAR STENOSIS   3D acquisition and reconstructions were performed as part of this   examination to more accurately quantify the effects of moderate or greater   valve regurgitation. (post-processing on an Independent workstation).    Compared with prior Echo study on 07/24/2016: MR INCREASED  2. Cough chronic on CT chest found to be ground glass changes and NSIP likely related to rheumatoid arthritis will let [place] rheum know  CT chest 10/13/2018  IMPRESSION: 1. The appearance of the lungs does suggest very early or mild interstitial lung disease, with a spectrum of findings considered indeterminate for usual interstitial pneumonia (UIP) per current ATS guidelines. This could reflect very mild nonspecific interstitial pneumonia (NSIP), although very early UIP is not excluded. Repeat high-resolution chest CT is recommended in 12 months to assess for temporal changes in the appearance of the lung parenchyma. 2. Aortic atherosclerosis, in addition to left main and 3 vessel coronary artery disease. Please note that although the presence of coronary artery calcium documents the presence of coronary artery disease, the severity of this disease and any potential stenosis cannot be assessed on this non-gated CT examination. Assessment for potential risk factor modification, dietary therapy or pharmacologic therapy may be warranted, if clinically indicated. 3. There are calcifications of the aortic valve and mitral annulus. Echocardiographic correlation for evaluation of potential valvular dysfunction may be warranted if clinically indicated. 4. Mild cardiomegaly.  Aortic  Atherosclerosis (ICD10-I70.0).   3. Osteoporosis now off forteo took x 2 years pt has not heard about yearly infusions with [facility] endocrine  She has fracture in right arm since 07/2018 from trying to open the storm door and now in a sling via ortho and awaiting a bone device to help her bones grow back together per pt per ortho   4. Skin left hip lesion rubbing on clothes and increasing in size  Needs to see dermatology for removal     Review of Systems  Constitutional: Negative for weight loss.  HENT: Negative for hearing loss.   Eyes: Negative for blurred vision.  Respiratory: Positive for cough.   Cardiovascular: Negative for chest pain.  Gastrointestinal: Negative for abdominal pain.  Musculoskeletal: Negative for falls.       +right arm fracture    Skin: Negative for rash.  Neurological: Negative for headaches.  Psychiatric/Behavioral: Negative for depression and memory loss.   Past Medical History:  Diagnosis Date  . Anxiety   . Asthma   . Bipolar disorder ([place])   . Cataract    s/p b/l repair   . Chicken pox   . CKD (chronic kidney disease)   . CKD (chronic kidney disease), stage III    a. s/p R nephrectomy.  . Conversion disorder   . COPD (chronic obstructive pulmonary disease) ([place])   . Depression   . Essential hypertension   . GERD (gastroesophageal reflux disease)   . Hyperlipidemia   . Inflammatory arthritis    a. hands/carpal tunnel.  b. Low titer rheumatoid factor. c. Negative anti-CCP antibodies. d. Plaquenil.  . Non-Obstructive CAD    a. 07/2009 Cath ([facility]): nonobs dzs;  b. 03/2011 Cath [facility]): nonobs dzs.  . Osteoarthritis  a. Knees.  . PUD (peptic ulcer disease)   . S/P right hip fracture    11/01/16 s/p repair  . Shoulder pain   . Sleep apnea    no cpap  . Spinal stenosis at L4-L5 level    severe with L4/L5 anterolisthesis grade 1 anterolisthesis   . Toxic maculopathy   . Valvular heart disease    a. 07/2015 Echo: EF 55-60%, Mild AI, AS, MR, and  TR.   Past Surgical History:  Procedure Laterality Date  . APPENDECTOMY    . BACK SURGERY    . BUNIONECTOMY Right   . CATARACT EXTRACTION, BILATERAL    . CESAREAN SECTION     x1  . CHOLECYSTECTOMY N/A 05/11/2016   Procedure: LAPAROSCOPIC CHOLECYSTECTOMY;  Surgeon: Florene Glen, MD;  Location: [facility] ORS;  Service: General;  Laterality: N/A;  . COLONOSCOPY WITH PROPOFOL N/A 04/02/2016   Procedure: COLONOSCOPY WITH PROPOFOL;  Surgeon: Jonathon Bellows, MD;  Location: [facility] ENDOSCOPY;  Service: Endoscopy;  Laterality: N/A;  . ENDOSCOPIC RETROGRADE CHOLANGIOPANCREATOGRAPHY (ERCP) WITH PROPOFOL N/A 05/08/2016   Procedure: ENDOSCOPIC RETROGRADE CHOLANGIOPANCREATOGRAPHY (ERCP) WITH PROPOFOL;  Surgeon: Lucilla Lame, MD;  Location: [facility] ENDOSCOPY;  Service: Endoscopy;  Laterality: N/A;  . ERCP     with biliary spincterotomy 05/08/16 Dr. Allen Norris for choledocholithiasis   . ESOPHAGEAL DILATION  04/02/2016   Procedure: ESOPHAGEAL DILATION;  Surgeon: Jonathon Bellows, MD;  Location: [facility] ENDOSCOPY;  Service: Endoscopy;;  . ESOPHAGOGASTRODUODENOSCOPY (EGD) WITH PROPOFOL N/A 04/02/2016   Procedure: ESOPHAGOGASTRODUODENOSCOPY (EGD) WITH PROPOFOL;  Surgeon: Jonathon Bellows, MD;  Location: [facility] ENDOSCOPY;  Service: Endoscopy;  Laterality: N/A;  . HIP ARTHROPLASTY Right 11/01/2016   Procedure: ARTHROPLASTY BIPOLAR HIP (HEMIARTHROPLASTY);  Surgeon: Corky Mull, MD;  Location: [facility] ORS;  Service: Orthopedics;  Laterality: Right;  . 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 osteoporosis     noted DEXA 08/19/16   . REPLACEMENT TOTAL KNEE Right   . REVERSE SHOULDER ARTHROPLASTY Right 11/04/2017   Procedure: REVERSE SHOULDER ARTHROPLASTY;  Surgeon: Corky Mull, MD;  Location: [facility] ORS;  Service: Orthopedics;  Laterality: Right;  . REVERSE SHOULDER ARTHROPLASTY Left 07/26/2018   Procedure: REVERSE SHOULDER ARTHROPLASTY;  Surgeon: Corky Mull, MD;  Location: [facility] ORS;  Service: Orthopedics;   Laterality: Left;  . TONSILLECTOMY    . TOTAL HIP ARTHROPLASTY  12/10/11   [facility] left hip  . TOTAL HIP ARTHROPLASTY Bilateral   . TUBAL LIGATION     Family History  Problem Relation Age of Onset  . Rheum arthritis Mother   . Asthma Mother   . Parkinson's disease Mother   . Heart disease Mother   . Stroke Mother   . Hypertension Mother   . Heart attack Father   . Heart disease Father   . Hypertension Father   . Diabetes Son   . Gout Son   . Asthma Sister   . Heart disease Sister   . Lung cancer Sister   . Heart disease Sister   . Heart disease Sister   . Breast cancer Sister   . Heart attack Sister   . Heart disease Brother   . Heart disease Maternal Grandmother   . Diabetes Maternal Grandmother   . Colon cancer Maternal Grandmother   . Cancer Maternal Grandmother        Hodgkins lymphoma  . Heart disease Brother   . Alcohol abuse Brother   . Depression Brother   . Dementia  Son    Social History   Socioeconomic History  . Marital status: Married    Spouse name: richard  . Number of children: 2  . Years of education: Some Coll  . Highest education level: Some college, no degree  Occupational History  . Occupation: Retired    Comment: retired  Scientific laboratory technician  . Financial resource strain: Not hard at all  . Food insecurity    Worry: Never true    Inability: Never true  . Transportation needs    Medical: No    Non-medical: No  Tobacco Use  . Smoking status: Former Smoker    Packs/day: 0.50    Years: 20.00    Pack years: 10.00    Types: Cigarettes    Quit date: 02/02/1974    Years since quitting: 44.8  . Smokeless tobacco: Never Used  Substance and Sexual Activity  . Alcohol use: No  . Drug use: No  . Sexual activity: Not Currently  Lifestyle  . Physical activity    Days per week: 0 days    Minutes per session: 0 min  . Stress: Not on file  Relationships  . Social connections    Talks on phone: More than three times a week    Gets together: Never     Attends religious service: More than 4 times per year    Active member of club or organization: No    Attends meetings of clubs or organizations: Never    Relationship status: Married  . Intimate partner violence    Fear of current or ex partner: No    Emotionally abused: No    Physically abused: No    Forced sexual activity: No  Other Topics Concern  . Not on file  Social History Narrative   Lives at home in [place] with her husband and grandson.   Right-handed.   6 cups coffee per day.   Current Meds  Medication Sig  . Acetylcysteine 600 MG CAPS Take 1 capsule (600 mg total) by mouth 2 (two) times daily with a meal.  . Adalimumab (HUMIRA PEN) 40 MG/0.4ML PNKT Inject 40 mg into the skin every 14 (fourteen) days.   Marland Kitchen albuterol (VENTOLIN HFA) 108 (90 Base) MCG/ACT inhaler Inhale 2 puffs into the lungs every 6 (six) hours as needed. (Patient taking differently: Inhale 2 puffs into the lungs every 6 (six) hours as needed for wheezing or shortness of breath. )  . ALPRAZolam (XANAX) 0.25 MG tablet Take 1 tablet (0.25 mg total) by mouth at bedtime as needed for anxiety.  . benzonatate (TESSALON) 200 MG capsule Take 1 capsule (200 mg total) by mouth 3 (three) times daily as needed for cough.  . BYSTOLIC 10 MG tablet TAKE 1 TABLET BY MOUTH EVERY DAY  . clotrimazole (LOTRIMIN) 1 % cream Apply 1 application topically 2 (two) times daily as needed (for irritation).  Marland Kitchen dicyclomine (BENTYL) 10 MG capsule Take 1 capsule (10 mg total) by mouth 4 (four) times daily -  before meals and at bedtime.  Marland Kitchen escitalopram (LEXAPRO) 10 MG tablet Take 1 tablet (10 mg total) by mouth daily.  . fluticasone furoate-vilanterol (BREO ELLIPTA) 200-25 MCG/INH AEPB Inhale 1 puff into the lungs daily.  Marland Kitchen gabapentin (NEURONTIN) 300 MG capsule Take 2-3 capsules (600-900 mg total) by mouth See admin instructions. Take 600 mg by mouth in the morning and 900 mg at bedtime  . ipratropium-albuterol (DUONEB) 0.5-2.5 (3) MG/3ML  SOLN Take 3 mLs by nebulization every 8 (eight)  hours as needed.  . lamoTRIgine (LAMICTAL) 100 MG tablet Take 1 tablet (100 mg total) by mouth 2 (two) times daily.  Marland Kitchen leflunomide (ARAVA) 20 MG tablet Take 1 tablet (20 mg total) by mouth daily.  Marland Kitchen lovastatin (MEVACOR) 40 MG tablet Take 1 tablet (40 mg total) by mouth at bedtime.  . Melatonin 10 MG TABS Take 10 mg by mouth at bedtime.  . mirabegron ER (MYRBETRIQ) 50 MG TB24 tablet Take 1 tablet (50 mg total) by mouth at bedtime.  . montelukast (SINGULAIR) 10 MG tablet Take 1 tablet (10 mg total) by mouth daily.  . multivitamin-lutein (OCUVITE-LUTEIN) CAPS capsule Take 1 capsule by mouth 2 (two) times daily.  . mupirocin ointment (BACTROBAN) 2 % Apply 1 application topically 2 (two) times daily. X 1 week  . ondansetron (ZOFRAN) 4 MG tablet Take 1 tablet (4 mg total) by mouth every 8 (eight) hours as needed for nausea or vomiting.  Marland Kitchen oxyCODONE (OXY IR/ROXICODONE) 5 MG immediate release tablet Take 1-2 tablets (5-10 mg total) by mouth every 4 (four) hours as needed for breakthrough pain.  . pantoprazole (PROTONIX) 40 MG tablet Take 1 tablet (40 mg total) by mouth 2 (two) times daily. 30 minutes before food. Note reduction in frequency  . QUEtiapine (SEROQUEL) 25 MG tablet Take 1 tablet (25 mg total) by mouth at bedtime.  Marland Kitchen Spacer/Aero-Holding Chambers (AEROCHAMBER MV) inhaler Use as instructed  . sucralfate (CARAFATE) 1 g tablet Take 1 tablet (1 g total) by mouth 4 (four) times daily. TAKE 1 TABLET BY MOUTH 4 TIMES A DAY WITH MEALS AND AT BEDTIME  . Teriparatide, Recombinant, (FORTEO) 600 MCG/2.4ML SOLN Inject 0.08 mLs (20 mcg total) into the skin daily.  . Tiotropium Bromide Monohydrate (SPIRIVA RESPIMAT) 2.5 MCG/ACT AERS Inhale 2 puffs into the lungs daily. (Patient taking differently: Inhale 2 puffs into the lungs at bedtime. )  . traMADol (ULTRAM) 50 MG tablet Take 1 tablet (50 mg total) by mouth every 6 (six) hours as needed.   Allergies   Allergen Reactions  . Ceftin [Cefuroxime Axetil] Anaphylaxis  . Lisinopril Anaphylaxis  . Morphine Other (See Comments)    Per patient, low blood pressure issues that requires action to raise it back up. Can take small infrequent doses  . Sulfasalazine Anaphylaxis  . Aspirin Other (See Comments)    Sulfasalazine allergy cross reacts  . Antihistamines, Chlorpheniramine-Type Other (See Comments)    Makes pt hyper  . Antivert [Meclizine Hcl] Other (See Comments)    Bladder will not empty  . Decongestant [Pseudoephedrine Hcl] Other (See Comments)    Makes pt hyper  . Doxycycline Other (See Comments)    GI upset  . Polymyxin B Other (See Comments)    Medication was in eye drops.  . Sulfa Antibiotics Other (See Comments)    Face swelling  . Xarelto [Rivaroxaban] Other (See Comments)    Stomach burning, bleeding, and tar in stool  . Adhesive [Tape] Rash  . Iodine Hives and Rash    Per patient allergy is to contrast dye only, she is able to use betadine scrubs.  [place] [Levofloxacin In D5w] Rash  . Tetanus Toxoids Rash and Other (See Comments)    Fever and hot to touch at injection site   No results found for this or any previous visit (from the past 2160 hour(s)). Objective  Body mass index is 32.79 kg/m. Wt Readings from Last 3 Encounters:  11/24/18 176 lb 6.4 oz (80 kg)  10/31/18 176 lb  6.4 oz (80 kg)  10/24/18 172 lb 12.8 oz (78.4 kg)   Temp Readings from Last 3 Encounters:  11/24/18 97.7 F (36.5 C) (Oral)  10/31/18 97.8 F (36.6 C) (Temporal)  10/24/18 98 F (36.7 C)   BP Readings from Last 3 Encounters:  11/24/18 130/68  10/31/18 126/68  10/24/18 (!) 145/63   Pulse Readings from Last 3 Encounters:  11/24/18 78  10/31/18 72  10/24/18 69    Physical Exam Vitals signs and nursing note reviewed.  Constitutional:      Appearance: Normal appearance. She is well-developed and well-groomed. She is obese.     Comments: +faceshield on    HENT:     Head:  Normocephalic and atraumatic.  Eyes:     Conjunctiva/sclera: Conjunctivae normal.     Pupils: Pupils are equal, round, and reactive to light.  Cardiovascular:     Rate and Rhythm: Normal rate and regular rhythm.     Heart sounds: Murmur present.  Pulmonary:     Effort: Pulmonary effort is normal.     Breath sounds: Normal breath sounds.  Musculoskeletal:     Comments: Right arm in sling    Skin:    General: Skin is warm and dry.          Comments: 1-1.5 cm hyperkeratotic lesion    Neurological:     General: No focal deficit present.     Mental Status: She is alert and oriented to person, place, and time. Mental status is at baseline.     Gait: Gait normal.  Psychiatric:        Attention and Perception: Attention and perception normal.        Mood and Affect: Mood and affect normal.        Speech: Speech normal.        Behavior: Behavior normal. Behavior is cooperative.        Thought Content: Thought content normal.        Cognition and Memory: Cognition and memory normal.        Judgment: Judgment normal.     Assessment  Plan  Leg skin lesion, left - Plan: Ambulatory referral to Dermatology [facility] derm  Skin cancer screening - Plan: Ambulatory referral to Dermatology  Leukocytosis, unspecified type - Plan: CBC with Differential/Platelet repeat labs at [facility] given copy of labs   Rheumatoid arthritis, involving unspecified site, unspecified whether rheumatoid factor present ([place]) - Plan: Comprehensive metabolic panel, CBC with Differential/Platelet Sent Dr. Jefm Bryant message to let know about CT chest changes may be 2/2 rheumatologic condition   Osteoporosis  Off forteo was on x 2 years Pending infusion to be set up 1x per year with [facility] rheumatology will CC Dr. Jefm Bryant to let him know pt not heard about infusion   Hyperglycemia - Plan: Hemoglobin A1c  Vitamin D deficiency - Plan: Vitamin D (25 hydroxy)  HM Had flu8/31/20 pulm clinic, prevnar, pna 23utd allergic to Tdap  declines shingrix   mammo  04/26/2018 repeat normal referred again  Colonoscopy 04/02/16 multiple polyps but tortuous colon GI does not want to do any further  Out of age window pap  DEXA 08/19/16 +osteoporosis on Forteo will need to repeat 1 year after Forteo use to see improvement rheumatology following. -DEXA 10/12/17 with T score -4.1 worse on forteounable to do any further bone densities per pt 07/07/2018 due to h/o multiple ortho surgeries   Seeing dermatology in [place] wants referral [facility]  Retinal specialist 7/2019scheduled 12/2018  Provider: Dr. Olivia Mackie McLean-Scocuzza-Internal Medicine

## 2018-11-24 NOTE — Patient Instructions (Addendum)
NORMAL LEFT VENTRICULAR SYSTOLIC FUNCTION WITH MILD LVH   NORMAL RIGHT VENTRICULAR SYSTOLIC FUNCTION   VALVULAR REGURGITATION: MODERATE AR, MODERATE MR, TRIVIAL TR   NO VALVULAR STENOSIS   3D acquisition and reconstructions were performed as part of this   examination to more accurately quantify the effects of moderate or greater   valve regurgitation. (post-processing on an Independent workstation).    Compared with prior Echo study on 07/24/2016: MR INCREASED   NORMAL LEFT VENTRICULAR SYSTOLIC FUNCTION WITH MILD LVH NORMAL LA PRESSURES WITH DIASTOLIC DYSFUNCTION NORMAL RIGHT VENTRICULAR SYSTOLIC FUNCTION VALVULAR REGURGITATION: MODERATE AR, MILD MR, TRIVIAL TR VALVULAR STENOSIS: TRIVIAL AS 3D acquisition and reconstructions were performed as part of this examination to more accurately quantify the effects of moderate or greater valve regurgitation.  Compared with prior Echo study on 04/23/2015: NO SIGNIFICANT CHANGE; AORTIC ARCH NOT AS WELL VISUALIZED ON THIS STUDY   (Report version 3.0)Interpreted and Electronically signed  Perform. by: Gardiner Fanti by: Meryl Dare,  Resp.Person: Gardiner Fanti On: 07/24/2016 12:04:24

## 2018-12-01 ENCOUNTER — Other Ambulatory Visit: Payer: Self-pay

## 2018-12-01 ENCOUNTER — Encounter: Payer: Self-pay | Admitting: Internal Medicine

## 2018-12-01 ENCOUNTER — Other Ambulatory Visit
Admission: RE | Admit: 2018-12-01 | Discharge: 2018-12-01 | Disposition: A | Discharge status: Home / Self Care | Payer: Medicare Other | Source: Ambulatory Visit | Attending: Internal Medicine | Admitting: Internal Medicine

## 2018-12-01 DIAGNOSIS — M810 Age-related osteoporosis without current pathological fracture: Secondary | ICD-10-CM | POA: Insufficient documentation

## 2018-12-01 DIAGNOSIS — D72829 Elevated white blood cell count, unspecified: Secondary | ICD-10-CM

## 2018-12-01 DIAGNOSIS — R739 Hyperglycemia, unspecified: Secondary | ICD-10-CM | POA: Diagnosis present

## 2018-12-01 DIAGNOSIS — Z13220 Encounter for screening for lipoid disorders: Secondary | ICD-10-CM

## 2018-12-01 DIAGNOSIS — M069 Rheumatoid arthritis, unspecified: Secondary | ICD-10-CM | POA: Diagnosis present

## 2018-12-01 DIAGNOSIS — Z1329 Encounter for screening for other suspected endocrine disorder: Secondary | ICD-10-CM

## 2018-12-01 DIAGNOSIS — E559 Vitamin D deficiency, unspecified: Secondary | ICD-10-CM | POA: Diagnosis present

## 2018-12-01 LAB — CBC WITH DIFFERENTIAL/PLATELET
Abs Immature Granulocytes: 0.02 10*3/uL (ref 0.00–0.07)
Basophils Absolute: 0.1 10*3/uL (ref 0.0–0.1)
Basophils Relative: 1 %
Eosinophils Absolute: 0.7 10*3/uL — ABNORMAL HIGH (ref 0.0–0.5)
Eosinophils Relative: 9 %
HCT: 40.5 % (ref 36.0–46.0)
Hemoglobin: 12.6 g/dL (ref 12.0–15.0)
Immature Granulocytes: 0 %
Lymphocytes Relative: 32 %
Lymphs Abs: 2.5 10*3/uL (ref 0.7–4.0)
MCH: 27.5 pg (ref 26.0–34.0)
MCHC: 31.1 g/dL (ref 30.0–36.0)
MCV: 88.4 fL (ref 80.0–100.0)
Monocytes Absolute: 0.8 10*3/uL (ref 0.1–1.0)
Monocytes Relative: 10 %
Neutro Abs: 3.8 10*3/uL (ref 1.7–7.7)
Neutrophils Relative %: 48 %
Platelets: 197 10*3/uL (ref 150–400)
RBC: 4.58 MIL/uL (ref 3.87–5.11)
RDW: 15.1 % (ref 11.5–15.5)
WBC: 7.9 10*3/uL (ref 4.0–10.5)
nRBC: 0 % (ref 0.0–0.2)

## 2018-12-01 LAB — COMPREHENSIVE METABOLIC PANEL
ALT: 15 U/L (ref 0–44)
AST: 21 U/L (ref 15–41)
Albumin: 3.9 g/dL (ref 3.5–5.0)
Alkaline Phosphatase: 87 U/L (ref 38–126)
Anion gap: 12 (ref 5–15)
BUN: 19 mg/dL (ref 8–23)
CO2: 24 mmol/L (ref 22–32)
Calcium: 8.9 mg/dL (ref 8.9–10.3)
Chloride: 103 mmol/L (ref 98–111)
Creatinine, Ser: 1.32 mg/dL — ABNORMAL HIGH (ref 0.44–1.00)
GFR calc Af Amer: 45 mL/min — ABNORMAL LOW (ref >60)
GFR calc non Af Amer: 39 mL/min — ABNORMAL LOW (ref >60)
Glucose, Bld: 121 mg/dL — ABNORMAL HIGH (ref 70–99)
Potassium: 4.2 mmol/L (ref 3.5–5.1)
Sodium: 139 mmol/L (ref 135–145)
Total Bilirubin: 0.6 mg/dL (ref 0.3–1.2)
Total Protein: 6.9 g/dL (ref 6.5–8.1)

## 2018-12-01 LAB — LIPID PANEL
Cholesterol: 147 mg/dL (ref 0–200)
HDL: 42 mg/dL (ref >40)
LDL Cholesterol: 60 mg/dL (ref 0–99)
Total CHOL/HDL Ratio: 3.5 RATIO
Triglycerides: 227 mg/dL — ABNORMAL HIGH (ref <150)
VLDL: 45 mg/dL — ABNORMAL HIGH (ref 0–40)

## 2018-12-01 LAB — FERRITIN: Ferritin: 68 ng/mL (ref 11–307)

## 2018-12-01 LAB — IRON AND TIBC
Iron: 74 ug/dL (ref 28–170)
Saturation Ratios: 19 % (ref 10.4–31.8)
TIBC: 395 ug/dL (ref 250–450)
UIBC: 321 ug/dL

## 2018-12-01 LAB — TSH: TSH: 1.734 u[IU]/mL (ref 0.350–4.500)

## 2018-12-01 LAB — VITAMIN D 25 HYDROXY (VIT D DEFICIENCY, FRACTURES): Vit D, 25-Hydroxy: 31.93 ng/mL (ref 30–100)

## 2018-12-01 LAB — HEMOGLOBIN A1C
Hgb A1c MFr Bld: 6.1 % — ABNORMAL HIGH (ref 4.8–5.6)
Mean Plasma Glucose: 128.37 mg/dL

## 2018-12-01 NOTE — Addendum Note (Signed)
Addended by: Santiago Bur on: 12/01/2018 09:23 AM   Modules accepted: Orders

## 2018-12-01 NOTE — Addendum Note (Signed)
Addended by: Santiago Bur on: 12/01/2018 09:22 AM   Modules accepted: Orders

## 2018-12-01 NOTE — Addendum Note (Signed)
Addended by: Santiago Bur on: 12/01/2018 09:24 AM   Modules accepted: Orders

## 2018-12-02 ENCOUNTER — Encounter: Payer: Self-pay | Admitting: Internal Medicine

## 2018-12-02 ENCOUNTER — Other Ambulatory Visit: Payer: Self-pay | Admitting: Internal Medicine

## 2018-12-02 DIAGNOSIS — N1832 Chronic kidney disease, stage 3b: Secondary | ICD-10-CM

## 2018-12-05 ENCOUNTER — Other Ambulatory Visit: Payer: Self-pay

## 2018-12-05 ENCOUNTER — Ambulatory Visit (INDEPENDENT_AMBULATORY_CARE_PROVIDER_SITE_OTHER): Payer: Medicare Other | Admitting: Podiatry

## 2018-12-05 ENCOUNTER — Encounter: Payer: Self-pay | Admitting: Podiatry

## 2018-12-05 DIAGNOSIS — M79672 Pain in left foot: Secondary | ICD-10-CM

## 2018-12-05 DIAGNOSIS — B351 Tinea unguium: Secondary | ICD-10-CM | POA: Diagnosis not present

## 2018-12-05 DIAGNOSIS — M79674 Pain in right toe(s): Secondary | ICD-10-CM | POA: Diagnosis not present

## 2018-12-05 DIAGNOSIS — M2010 Hallux valgus (acquired), unspecified foot: Secondary | ICD-10-CM | POA: Insufficient documentation

## 2018-12-05 DIAGNOSIS — M79675 Pain in left toe(s): Secondary | ICD-10-CM

## 2018-12-05 HISTORY — DX: Tinea unguium: B35.1

## 2018-12-05 HISTORY — DX: Hallux valgus (acquired), unspecified foot: M20.10

## 2018-12-05 NOTE — Progress Notes (Signed)
Complaint:  Visit Type: Patient returns to my office for continued preventative foot care services. Complaint: Patient states" my nails have grown long and thick and become painful to walk and wear shoes" Patient has not been seen in over 16 months.. The patient presents for preventative foot care services.  Podiatric Exam: Vascular: dorsalis pedis  are palpable bilateral.  Posterior tibial pulses are absent  B/L. Capillary return is immediate. Temperature gradient is WNL. Skin turgor WNL  Sensorium: Diminished  Semmes Weinstein monofilament test. Normal tactile sensation bilaterally. Nail Exam: Pt has thick disfigured discolored nails with subungual debris noted bilateral entire nail hallux through fifth toenails Ulcer Exam: There is no evidence of ulcer or pre-ulcerative changes or infection. Orthopedic Exam: Muscle tone and strength are WNL. No limitations in general ROM. No crepitus or effusions noted. Foot type and digits show no abnormalities. HAV  B/L.  Pes planus  B/L. Skin: No Porokeratosis. No infection or ulcers  Diagnosis:  Onychomycosis, , Pain in right toe, pain in left toes  Treatment & Plan Procedures and Treatment: Consent by patient was obtained for treatment procedures.   Debridement of mycotic and hypertrophic toenails, 1 through 5 bilateral and clearing of subungual debris. No ulceration, no infection noted.  Return Visit-Office Procedure: Patient instructed to return to the office for a follow up visit 3 months for continued evaluation and treatment.    Gardiner Barefoot DPM

## 2018-12-06 ENCOUNTER — Ambulatory Visit (INDEPENDENT_AMBULATORY_CARE_PROVIDER_SITE_OTHER): Payer: Medicare Other | Admitting: Gastroenterology

## 2018-12-06 ENCOUNTER — Other Ambulatory Visit: Payer: Self-pay

## 2018-12-06 ENCOUNTER — Encounter: Payer: Self-pay | Admitting: Gastroenterology

## 2018-12-06 VITALS — BP 143/78 | HR 67 | Temp 98.1°F | Ht 61.5 in | Wt 174.4 lb

## 2018-12-06 DIAGNOSIS — K589 Irritable bowel syndrome without diarrhea: Secondary | ICD-10-CM

## 2018-12-06 NOTE — Progress Notes (Signed)
[name], MRCP(U.K) [address]  [place]  [place], [place] [address]  Main: [phone]  Fax: [phone]   Primary Care Physician: McLean-Scocuzza, Nino Glow, MD  Primary Gastroenterologist:  Dr. Jonathon Bellows    Here to follow-up for irritable bowel syndrome   HPI: Kerri Carter is a 78 y.o. female   Summary of history :  Was initially referred and seen by myself on 10/24/2018.She has previously seen Dr Tiffany Kocher at [facility] ,h/o dysphagia, diarrhea, iron deficiency anemia due to small bowel AVM's .  In 2018 she was seen by myself for abdominal pain and anemia.  A CT scan of the abdomen at that point of time showed an adnexal lesion. LFT's in 02/2014 showed an elevated alkaline phosphatase , normal lipase and Hb 11 grams . Iron studies normal .EGD/colonoscopy 04/2016,EGD - mild chronic duodenitis on bx , normal villi. Gastric biopsies showed erosive gastritis , normal esophageal biopsies. Random colon biopsies showed mild active colitis., 3 tubular adenomas were excised. Celiac serology was negative , TSH normal  07/27/2018: Hemoglobin 11.3 g.  09/30/2018 hemoglobin 12.4 g.  Interval history 10/24/2018-12/06/2018  On 10/24/2018 she states that she had abdominal pain for 6 months which is cramping in nature, nonradiating, on and off, preceded by bowel movement and relieved subsequently.  Occasionally also has pain after bowel movement.  Stool is the consistency of pudding.  Has a bowel movement daily.  Since her last visit she states that she commenced on Bentyl and 1 fiber pill per day and has noticed a 60% improvement in her symptoms.  She used to get 3 episodes of pain a week and also onto one episode.  She has not tried to increase the fiber content further.  Denies any weight loss or other weight gain.    Current Outpatient Medications  Medication Sig Dispense Refill  . Acetylcysteine 600 MG CAPS Take 1 capsule (600 mg total) by mouth 2 (two) times daily with a meal. 60  capsule 2  . Adalimumab (HUMIRA PEN) 40 MG/0.4ML PNKT Inject 40 mg into the skin every 14 (fourteen) days.     Marland Kitchen albuterol (VENTOLIN HFA) 108 (90 Base) MCG/ACT inhaler Inhale 2 puffs into the lungs every 6 (six) hours as needed. (Patient taking differently: Inhale 2 puffs into the lungs every 6 (six) hours as needed for wheezing or shortness of breath. ) 1 Inhaler 5  . ALPRAZolam (XANAX) 0.25 MG tablet Take 1 tablet (0.25 mg total) by mouth at bedtime as needed for anxiety. 30 tablet 2  . benzonatate (TESSALON) 200 MG capsule Take 1 capsule (200 mg total) by mouth 3 (three) times daily as needed for cough. 30 capsule 2  . BYSTOLIC 10 MG tablet TAKE 1 TABLET BY MOUTH EVERY DAY 30 tablet 2  . clotrimazole (LOTRIMIN) 1 % cream Apply 1 application topically 2 (two) times daily as needed (for irritation). 60 g 11  . dicyclomine (BENTYL) 10 MG capsule Take 1 capsule (10 mg total) by mouth 4 (four) times daily -  before meals and at bedtime. 120 capsule 2  . escitalopram (LEXAPRO) 10 MG tablet Take 1 tablet (10 mg total) by mouth daily. 90 tablet 3  . fluticasone furoate-vilanterol (BREO ELLIPTA) 200-25 MCG/INH AEPB Inhale 1 puff into the lungs daily. 28 each 11  . gabapentin (NEURONTIN) 300 MG capsule Take 2-3 capsules (600-900 mg total) by mouth See admin instructions. Take 600 mg by mouth in the morning and 900 mg at bedtime 450  capsule 1  . ipratropium-albuterol (DUONEB) 0.5-2.5 (3) MG/3ML SOLN Take 3 mLs by nebulization every 8 (eight) hours as needed. 360 mL 12  . lamoTRIgine (LAMICTAL) 100 MG tablet Take 1 tablet (100 mg total) by mouth 2 (two) times daily. 180 tablet 2  . leflunomide (ARAVA) 20 MG tablet Take 1 tablet (20 mg total) by mouth daily.    Marland Kitchen lovastatin (MEVACOR) 40 MG tablet Take 1 tablet (40 mg total) by mouth at bedtime. 90 tablet 3  . Melatonin 10 MG TABS Take 10 mg by mouth at bedtime. 30 tablet 3  . mirabegron ER (MYRBETRIQ) 50 MG TB24 tablet Take 1 tablet (50 mg total) by mouth at  bedtime. 30 tablet 5  . montelukast (SINGULAIR) 10 MG tablet Take 1 tablet (10 mg total) by mouth daily. 90 tablet 3  . multivitamin-lutein (OCUVITE-LUTEIN) CAPS capsule Take 1 capsule by mouth 2 (two) times daily.    . mupirocin ointment (BACTROBAN) 2 % Apply 1 application topically 2 (two) times daily. X 1 week 30 g 0  . ondansetron (ZOFRAN) 4 MG tablet Take 1 tablet (4 mg total) by mouth every 8 (eight) hours as needed for nausea or vomiting. 30 tablet 0  . oxyCODONE (OXY IR/ROXICODONE) 5 MG immediate release tablet Take 1-2 tablets (5-10 mg total) by mouth every 4 (four) hours as needed for breakthrough pain. 60 tablet 0  . pantoprazole (PROTONIX) 40 MG tablet Take 1 tablet (40 mg total) by mouth 2 (two) times daily. 30 minutes before food. Note reduction in frequency 180 tablet 1  . QUEtiapine (SEROQUEL) 25 MG tablet Take 1 tablet (25 mg total) by mouth at bedtime. 90 tablet 2  . Spacer/Aero-Holding Chambers (AEROCHAMBER MV) inhaler Use as instructed 1 each 0  . sucralfate (CARAFATE) 1 g tablet Take 1 tablet (1 g total) by mouth 4 (four) times daily. TAKE 1 TABLET BY MOUTH 4 TIMES A DAY WITH MEALS AND AT BEDTIME 360 tablet 3  . Teriparatide, Recombinant, (FORTEO) 600 MCG/2.4ML SOLN Inject 0.08 mLs (20 mcg total) into the skin daily. 2.24 mL 0  . Tiotropium Bromide Monohydrate (SPIRIVA RESPIMAT) 2.5 MCG/ACT AERS Inhale 2 puffs into the lungs daily. (Patient taking differently: Inhale 2 puffs into the lungs at bedtime. ) 1 Inhaler 12  . traMADol (ULTRAM) 50 MG tablet Take 1 tablet (50 mg total) by mouth every 6 (six) hours as needed. 40 tablet 0   No current facility-administered medications for this visit.     Allergies as of 12/06/2018 - Review Complete 12/05/2018  Allergen Reaction Noted  . Ceftin [cefuroxime axetil] Anaphylaxis 09/08/2011  . Lisinopril Anaphylaxis 09/08/2011  . Morphine Other (See Comments) 02/25/2012  . Sulfasalazine Anaphylaxis 03/08/2014  . Aspirin Other (See  Comments) 09/15/2016  . Antihistamines, chlorpheniramine-type Other (See Comments) 09/08/2011  . Antivert [meclizine hcl] Other (See Comments) 09/08/2011  . Decongestant [pseudoephedrine hcl] Other (See Comments) 09/08/2011  . Doxycycline Other (See Comments) 09/08/2011  . Polymyxin b Other (See Comments) 02/28/2016  . Sulfa antibiotics Other (See Comments) 04/25/2014  . Xarelto [rivaroxaban] Other (See Comments) 01/19/2012  . Adhesive [tape] Rash 09/08/2011  . Iodine Hives and Rash 09/08/2011  . Levaquin [levofloxacin in d5w] Rash 09/08/2011  . Tetanus toxoids Rash and Other (See Comments) 09/08/2011    ROS:  General: Negative for anorexia, weight loss, fever, chills, fatigue, weakness. ENT: Negative for hoarseness, difficulty swallowing , nasal congestion. CV: Negative for chest pain, angina, palpitations, dyspnea on exertion, peripheral edema.  Respiratory: Negative for dyspnea  at rest, dyspnea on exertion, cough, sputum, wheezing.  GI: See history of present illness. GU:  Negative for dysuria, hematuria, urinary incontinence, urinary frequency, nocturnal urination.  Endo: Negative for unusual weight change.    Physical Examination:   There were no vitals taken for this visit.  General: Well-nourished, well-developed in no acute distress.  Eyes: No icterus. Conjunctivae pink. Mouth: Oropharyngeal mucosa moist and pink , no lesions erythema or exudate. Lungs: Clear to auscultation bilaterally. Non-labored. Heart: Regular rate and rhythm, no murmurs rubs or gallops.  Abdomen: Bowel sounds are normal, nontender, nondistended, no hepatosplenomegaly or masses, no abdominal bruits or hernia , no rebound or guarding.   Extremities: No lower extremity edema. No clubbing or deformities. Neuro: Alert and oriented x 3.  Grossly intact. Skin: Warm and dry, no jaundice.   Psych: Alert and cooperative, normal mood and affect. Right arm in sling  Imaging Studies: No results found.   Assessment and Plan:   Kerri Carter is a 78 y.o. y/o female here to follow-up for abdominal pain felt to be of irritable bowel syndrome in nature.  Overall significant improvement on commencing on the dicyclomine and fiber pills which he is taking once a day.   1.    Just to continue Bentyl and increase fiber pills to either 2 or 3 pills a day gradually.  I will give her a call back in 6 to 8 weeks and if the issue is resolved we will need no further follow-up but if it worsens will need a CT scan of the abdomen.      Dr Jonathon Bellows  MD,MRCP [facility]) Follow up in 8 weeks

## 2018-12-07 ENCOUNTER — Telehealth: Payer: Self-pay | Admitting: Internal Medicine

## 2018-12-07 NOTE — Telephone Encounter (Signed)
Patient requesting 12/01/2018 most recent lab results please fax to to [facility] [phone] today, patient would like a follow up call when done.

## 2018-12-08 NOTE — Telephone Encounter (Signed)
Labs have been faxed.

## 2018-12-15 ENCOUNTER — Emergency Department: Payer: Medicare Other

## 2018-12-15 ENCOUNTER — Emergency Department
Admission: EM | Admit: 2018-12-15 | Discharge: 2018-12-15 | Disposition: A | Discharge status: Home / Self Care | Payer: Medicare Other | Attending: Emergency Medicine | Admitting: Emergency Medicine

## 2018-12-15 ENCOUNTER — Other Ambulatory Visit: Payer: Self-pay

## 2018-12-15 ENCOUNTER — Encounter: Payer: Self-pay | Admitting: Emergency Medicine

## 2018-12-15 DIAGNOSIS — Z87891 Personal history of nicotine dependence: Secondary | ICD-10-CM | POA: Insufficient documentation

## 2018-12-15 DIAGNOSIS — X503XXA Overexertion from repetitive movements, initial encounter: Secondary | ICD-10-CM | POA: Diagnosis not present

## 2018-12-15 DIAGNOSIS — M25512 Pain in left shoulder: Secondary | ICD-10-CM | POA: Diagnosis not present

## 2018-12-15 DIAGNOSIS — Z96651 Presence of right artificial knee joint: Secondary | ICD-10-CM | POA: Insufficient documentation

## 2018-12-15 DIAGNOSIS — Z79899 Other long term (current) drug therapy: Secondary | ICD-10-CM | POA: Insufficient documentation

## 2018-12-15 DIAGNOSIS — J449 Chronic obstructive pulmonary disease, unspecified: Secondary | ICD-10-CM | POA: Insufficient documentation

## 2018-12-15 DIAGNOSIS — N183 Chronic kidney disease, stage 3 unspecified: Secondary | ICD-10-CM | POA: Diagnosis not present

## 2018-12-15 DIAGNOSIS — I129 Hypertensive chronic kidney disease with stage 1 through stage 4 chronic kidney disease, or unspecified chronic kidney disease: Secondary | ICD-10-CM | POA: Diagnosis not present

## 2018-12-15 DIAGNOSIS — Z96643 Presence of artificial hip joint, bilateral: Secondary | ICD-10-CM | POA: Diagnosis not present

## 2018-12-15 MED ORDER — HYDROMORPHONE HCL 1 MG/ML IJ SOLN
0.5000 mg | Freq: Once | INTRAMUSCULAR | Status: AC
  Administered 2018-12-15: 0.5 mg via INTRAMUSCULAR
  Filled 2018-12-15: qty 1

## 2018-12-15 NOTE — ED Provider Notes (Signed)
[facility] Emergency Department Provider Note  ____________________________________________  Time seen: Approximately 5:54 PM  I have reviewed the triage vital signs and the nursing notes.   HISTORY  Chief Complaint Shoulder Injury    HPI Kerri Carter is a 78 y.o. female presents to the emergency department with acute left shoulder pain.  Patient states that she has had a prior left reverse shoulder.  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  Patient states that she has had reduced range of motion since injury occurred.  She has been wearing a sling at home and has been taking oxycodone for pain.  Patient states that she is presenting to the emergency department for x-rays and for pain management.  She is requesting a one-time dose of Dilaudid.  No numbness or tingling in the upper extremities.  No other alleviating measures have been attempted.        Past Medical History:  Diagnosis Date  . Anxiety   . Asthma   . Bipolar disorder ([place])   . Cataract    s/p b/l repair   . Chicken pox   . CKD (chronic kidney disease)   . CKD (chronic kidney disease), stage III    a. s/p R nephrectomy.  . Conversion disorder   . COPD (chronic obstructive pulmonary disease) ([place])   . Depression   . Essential hypertension   . GERD (gastroesophageal reflux disease)   . Hyperlipidemia   . Inflammatory arthritis    a. hands/carpal tunnel.  b. Low titer rheumatoid factor. c. Negative anti-CCP antibodies. d. Plaquenil.  . Non-Obstructive CAD    a. 07/2009 Cath ([facility]): nonobs dzs;  b. 03/2011 Cath [facility]): nonobs dzs.  . Osteoarthritis    a. Knees.  . PUD (peptic ulcer disease)   . S/P right hip fracture    11/01/16 s/p repair  . Shoulder pain   . Sleep apnea    no cpap  . Spinal stenosis at L4-L5 level    severe with L4/L5 anterolisthesis grade 1 anterolisthesis   . Toxic maculopathy   . Valvular heart disease    a. 07/2015 Echo: EF 55-60%, Mild AI, AS, MR,  and TR.    Patient Active Problem List   Diagnosis Date Noted  . Pain due to onychomycosis of toenails of both feet 12/05/2018  . Hav (hallux abducto valgus), unspecified laterality 12/05/2018  . Status post reverse arthroplasty of shoulder, left 07/26/2018  . Small airways disease 07/07/2018  . Fatigue 07/07/2018  . Avascular necrosis of left shoulder due to adverse effect of steroid therapy ([place]) 01/20/2018  . Status post reverse total shoulder replacement, right 11/04/2017  . Leukocytosis 10/19/2017  . Allergic rhinitis 09/28/2017  . Rotator cuff tendinitis, right 07/26/2017  . Strain of right hip 02/26/2017  . History of fracture of left hip 01/15/2017  . Status post lumbar spinal fusion 01/15/2017  . Dislocation of hip joint prosthesis ([place]) 01/08/2017  . Status post hip hemiarthroplasty 01/08/2017  . Leg swelling 12/15/2016  . Hip fracture ([place]) 10/31/2016  . Polyneuropathy 10/28/2016  . Left foot pain 10/19/2016  . Spinal stenosis of lumbar region 09/15/2016  . Osteoporosis 08/27/2016  . Prediabetes 08/25/2016  . Mixed bipolar I disorder ([place]) 10/09/2015  . CKD (chronic kidney disease), stage III   . Essential hypertension   . Conversion disorder 08/04/2015  . Hyperlipidemia 07/17/2015  . 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 Macular degeneration 07/17/2015  . Insomnia  07/17/2015  . Rheumatoid arthritis ([place]) 12/05/2014  . Cough 05/02/2012  . Valvular heart disease 10/13/2011  . COPD (chronic obstructive pulmonary disease) ([place]) 09/09/2011  . OSA (obstructive sleep apnea) 09/09/2011  . Nocturnal hypoxemia 09/09/2011  . Gastroesophageal reflux disease 02/24/2011  . Single kidney 02/24/2011    Past Surgical History:  Procedure Laterality Date  . APPENDECTOMY    . BACK SURGERY    . BUNIONECTOMY Right   . CATARACT EXTRACTION, BILATERAL    . CESAREAN SECTION     x1  . CHOLECYSTECTOMY N/A 05/11/2016   Procedure: LAPAROSCOPIC  CHOLECYSTECTOMY;  Surgeon: Florene Glen, MD;  Location: [facility] ORS;  Service: General;  Laterality: N/A;  . COLONOSCOPY WITH PROPOFOL N/A 04/02/2016   Procedure: COLONOSCOPY WITH PROPOFOL;  Surgeon: Jonathon Bellows, MD;  Location: [facility] ENDOSCOPY;  Service: Endoscopy;  Laterality: N/A;  . ENDOSCOPIC RETROGRADE CHOLANGIOPANCREATOGRAPHY (ERCP) WITH PROPOFOL N/A 05/08/2016   Procedure: ENDOSCOPIC RETROGRADE CHOLANGIOPANCREATOGRAPHY (ERCP) WITH PROPOFOL;  Surgeon: Lucilla Lame, MD;  Location: [facility] ENDOSCOPY;  Service: Endoscopy;  Laterality: N/A;  . ERCP     with biliary spincterotomy 05/08/16 Dr. Allen Norris for choledocholithiasis   . ESOPHAGEAL DILATION  04/02/2016   Procedure: ESOPHAGEAL DILATION;  Surgeon: Jonathon Bellows, MD;  Location: [facility] ENDOSCOPY;  Service: Endoscopy;;  . ESOPHAGOGASTRODUODENOSCOPY (EGD) WITH PROPOFOL N/A 04/02/2016   Procedure: ESOPHAGOGASTRODUODENOSCOPY (EGD) WITH PROPOFOL;  Surgeon: Jonathon Bellows, MD;  Location: [facility] ENDOSCOPY;  Service: Endoscopy;  Laterality: N/A;  . HIP ARTHROPLASTY Right 11/01/2016   Procedure: ARTHROPLASTY BIPOLAR HIP (HEMIARTHROPLASTY);  Surgeon: Corky Mull, MD;  Location: [facility] ORS;  Service: Orthopedics;  Laterality: Right;  . 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 osteoporosis     noted DEXA 08/19/16   . REPLACEMENT TOTAL KNEE Right   . REVERSE SHOULDER ARTHROPLASTY Right 11/04/2017   Procedure: REVERSE SHOULDER ARTHROPLASTY;  Surgeon: Corky Mull, MD;  Location: [facility] ORS;  Service: Orthopedics;  Laterality: Right;  . REVERSE SHOULDER ARTHROPLASTY Left 07/26/2018   Procedure: REVERSE SHOULDER ARTHROPLASTY;  Surgeon: Corky Mull, MD;  Location: [facility] ORS;  Service: Orthopedics;  Laterality: Left;  . TONSILLECTOMY    . TOTAL HIP ARTHROPLASTY  12/10/11   [facility] left hip  . TOTAL HIP ARTHROPLASTY Bilateral   . TUBAL LIGATION      Prior to Admission medications   Medication Sig Start Date End Date Taking? Authorizing  Provider  Acetylcysteine 600 MG CAPS Take 1 capsule (600 mg total) by mouth 2 (two) times daily with a meal. 11/02/18   [name], MD  Adalimumab (HUMIRA PEN) 40 MG/0.4ML PNKT Inject 40 mg into the skin every 14 (fourteen) days.     [provider]  albuterol (VENTOLIN HFA) 108 (90 Base) MCG/ACT inhaler Inhale 2 puffs into the lungs every 6 (six) hours as needed. Patient taking differently: Inhale 2 puffs into the lungs every 6 (six) hours as needed for wheezing or shortness of breath.  11/25/15   Juanito Doom, MD  ALPRAZolam Duanne Moron) 0.25 MG tablet Take 1 tablet (0.25 mg total) by mouth at bedtime as needed for anxiety. 10/24/18   Rainey Pines, MD  BYSTOLIC 10 MG tablet TAKE 1 TABLET BY MOUTH EVERY DAY 09/26/18   Juanito Doom, MD  clotrimazole (LOTRIMIN) 1 % cream Apply 1 application topically 2 (two) times daily as needed (for irritation). 12/22/17   McLean-Scocuzza, Nino Glow, MD  dicyclomine (BENTYL) 10 MG capsule Take 1 capsule (  10 mg total) by mouth 4 (four) times daily -  before meals and at bedtime. 10/24/18 01/22/19  Jonathon Bellows, MD  escitalopram (LEXAPRO) 10 MG tablet Take 1 tablet (10 mg total) by mouth daily. 10/24/18   Rainey Pines, MD  fluticasone furoate-vilanterol (BREO ELLIPTA) 200-25 MCG/INH AEPB Inhale 1 puff into the lungs daily. 10/31/18   [name], MD  gabapentin (NEURONTIN) 300 MG capsule Take 2-3 capsules (600-900 mg total) by mouth See admin instructions. Take 600 mg by mouth in the morning and 900 mg at bedtime 08/01/18   McLean-Scocuzza, Nino Glow, MD  ipratropium-albuterol (DUONEB) 0.5-2.5 (3) MG/3ML SOLN Take 3 mLs by nebulization every 8 (eight) hours as needed. 03/25/18   McLean-Scocuzza, Nino Glow, MD  lamoTRIgine (LAMICTAL) 100 MG tablet Take 1 tablet (100 mg total) by mouth 2 (two) times daily. 10/24/18   Rainey Pines, MD  leflunomide (ARAVA) 20 MG tablet Take 1 tablet (20 mg total) by mouth daily. 09/30/17   McLean-Scocuzza, Nino Glow, MD   lovastatin (MEVACOR) 40 MG tablet Take 1 tablet (40 mg total) by mouth at bedtime. 01/21/18   McLean-Scocuzza, Nino Glow, MD  Melatonin 10 MG TABS Take 10 mg by mouth at bedtime. 08/09/17   Rainey Pines, MD  mirabegron ER (MYRBETRIQ) 50 MG TB24 tablet Take 1 tablet (50 mg total) by mouth at bedtime. 09/19/18   Zara Council A, PA-C  montelukast (SINGULAIR) 10 MG tablet Take 1 tablet (10 mg total) by mouth daily. 06/14/18   McLean-Scocuzza, Nino Glow, MD  multivitamin-lutein [facility]) CAPS capsule Take 1 capsule by mouth 2 (two) times daily.    [provider]  mupirocin ointment (BACTROBAN) 2 % Apply 1 application topically 2 (two) times daily. X 1 week 10/19/17   McLean-Scocuzza, Nino Glow, MD  ondansetron (ZOFRAN) 4 MG tablet Take 1 tablet (4 mg total) by mouth every 8 (eight) hours as needed for nausea or vomiting. 03/10/17   McLean-Scocuzza, Nino Glow, MD  pantoprazole (PROTONIX) 40 MG tablet Take 1 tablet (40 mg total) by mouth 2 (two) times daily. 30 minutes before food. Note reduction in frequency 10/31/18   [name], MD  QUEtiapine (SEROQUEL) 25 MG tablet Take 1 tablet (25 mg total) by mouth at bedtime. 10/24/18   Rainey Pines, MD  Spacer/Aero-Holding Chambers (AEROCHAMBER MV) inhaler Use as instructed 10/31/18   [name], MD  sucralfate (CARAFATE) 1 g tablet Take 1 tablet (1 g total) by mouth 4 (four) times daily. TAKE 1 TABLET BY MOUTH 4 TIMES A DAY WITH MEALS AND AT BEDTIME 10/27/18 10/22/19  Jonathon Bellows, MD  Teriparatide, Recombinant, (FORTEO) 600 MCG/2.4ML SOLN Inject 0.08 mLs (20 mcg total) into the skin daily. 11/03/16   Max Sane, MD  Tiotropium Bromide Monohydrate (SPIRIVA RESPIMAT) 2.5 MCG/ACT AERS Inhale 2 puffs into the lungs daily. Patient taking differently: Inhale 2 puffs into the lungs at bedtime.  02/24/18   McLean-Scocuzza, Nino Glow, MD    Allergies Ceftin [cefuroxime axetil]; Lisinopril; Morphine; Sulfasalazine; Aspirin; Antihistamines,  chlorpheniramine-type; Antivert [meclizine hcl]; Decongestant [pseudoephedrine hcl]; Doxycycline; Polymyxin b; Sulfa antibiotics; Xarelto [rivaroxaban]; Adhesive [tape]; Iodine; Levaquin [levofloxacin in d5w]; and Tetanus toxoids  Family History  Problem Relation Age of Onset  . Rheum arthritis Mother   . Asthma Mother   . Parkinson's disease Mother   . Heart disease Mother   . Stroke Mother   . Hypertension Mother   . Heart attack Father   . Heart disease Father   . Hypertension Father   .  Diabetes Son   . Gout Son   . Asthma Sister   . Heart disease Sister   . Lung cancer Sister   . Heart disease Sister   . Heart disease Sister   . Breast cancer Sister   . Heart attack Sister   . Heart disease Brother   . Heart disease Maternal Grandmother   . Diabetes Maternal Grandmother   . Colon cancer Maternal Grandmother   . Cancer Maternal Grandmother        Hodgkins lymphoma  . Heart disease Brother   . Alcohol abuse Brother   . Depression Brother   . Dementia Son     Social History Social History   Tobacco Use  . Smoking status: Former Smoker    Packs/day: 0.50    Years: 20.00    Pack years: 10.00    Types: Cigarettes    Quit date: 02/02/1974    Years since quitting: 44.8  . Smokeless tobacco: Never Used  Substance Use Topics  . Alcohol use: No  . Drug use: No     Review of Systems  Constitutional: No fever/chills Eyes: No visual changes. No discharge ENT: No upper respiratory complaints. Cardiovascular: no chest pain. Respiratory: no cough. No SOB. Gastrointestinal: No abdominal pain.  No nausea, no vomiting.  No diarrhea.  No constipation. Musculoskeletal: Patient has left shoulder pain.  Skin: Negative for rash, abrasions, lacerations, ecchymosis. Neurological: Negative for headaches, focal weakness or numbness.   ____________________________________________   PHYSICAL EXAM:  VITAL SIGNS: ED Triage Vitals [12/15/18 1607]  Enc Vitals Group     BP (!)  170/91     Pulse Rate 77     Resp 18     Temp 98.1 F (36.7 C)     Temp Source Oral     SpO2 94 %     Weight 170 lb (77.1 kg)     Height 5\' 1"  (1.549 m)     Head Circumference      Peak Flow      Pain Score 8     Pain Loc      Pain Edu?      Excl. in [place]?      Constitutional: Alert and oriented. Well appearing and in no acute distress. Eyes: Conjunctivae are normal. PERRL. EOMI. Head: Atraumatic. ENT: Cardiovascular: Normal rate, regular rhythm. Normal S1 and S2.  Good peripheral circulation. Respiratory: Normal respiratory effort without tachypnea or retractions. Lungs CTAB. Good air entry to the bases with no decreased or absent breath sounds. Musculoskeletal: Patient unable to perform full range of motion at the left shoulder.  Palpable radial pulse, left.  Capillary refill less than 2 seconds on the left. Neurologic:  Normal speech and language. No gross focal neurologic deficits are appreciated.  Skin:  Skin is warm, dry and intact. No rash noted. Psychiatric: Mood and affect are normal. Speech and behavior are normal. Patient exhibits appropriate insight and judgement.   ____________________________________________   LABS (all labs ordered are listed, but only abnormal results are displayed)  Labs Reviewed - No data to display ____________________________________________  EKG   ____________________________________________  RADIOLOGY I personally viewed and evaluated these images as part of my medical decision making, as well as reviewing the written report by the radiologist.  Dg Shoulder Left  Result Date: 12/15/2018 CLINICAL DATA:  Left shoulder pain. Heard a pop, severe pain since that time. Prior reverse left shoulder arthroplasty. EXAM: LEFT SHOULDER - 2+ VIEW COMPARISON:  Medial postoperative radiograph 07/26/2018  FINDINGS: Reverse left shoulder arthroplasty in expected alignment. No periprosthetic lucency or fracture. Acromioclavicular joint is congruent.  No focal soft tissue abnormality. IMPRESSION: Reverse left shoulder arthroplasty in expected alignment without complications. No acute or periprosthetic fracture. Electronically Signed   By: Keith Rake M.D.   On: 12/15/2018 16:56    ____________________________________________    PROCEDURES  Procedure(s) performed:    Procedures    Medications  HYDROmorphone (DILAUDID) injection 0.5 mg (has no administration in time range)     ____________________________________________   INITIAL IMPRESSION / ASSESSMENT AND PLAN / ED COURSE  Pertinent labs & imaging results that were available during my care of the patient were reviewed by me and considered in my medical decision making (see chart for details).  Review of the [place] CSRS was performed in accordance of the [place] prior to dispensing any controlled drugs.           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  On physical exam, patient was hypertensive and seemed uncomfortable.  Vital signs were otherwise reassuring.  She was unable to perform full range of motion at the left shoulder.  Differential diagnosis included fracture versus hardware disruption  No acute abnormality was identified on x-ray examination of the left shoulder.  Patient requested Dilaudid for pain and she has been taking oxycodone sparingly at home.  Patient states that she was given Dilaudid in the past when she had acute shoulder pain on the right.  Patient was given a one-time dose of Dilaudid in the emergency department tonight.  She was advised to follow-up with orthopedics, Dr. Roland Rack.  Return precautions were given.  All patient questions were answered.   ____________________________________________  FINAL CLINICAL IMPRESSION(S) / ED DIAGNOSES  Final diagnoses:  Acute pain of left shoulder      NEW MEDICATIONS  STARTED DURING THIS VISIT:  ED Discharge Orders    None          This chart was dictated using voice recognition software/Dragon. Despite best efforts to proofread, errors can occur which can change the meaning. Any change was purely unintentional.    Lannie Fields, PA-C 12/15/18 1800    Duffy Bruce, MD 12/15/18 1816

## 2018-12-15 NOTE — ED Triage Notes (Signed)
Here for left shoulder pain. 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  Also has fx in right shoulder currently. Took sling from right shoulder and put on left shoulder since left currently hurting worse but still having pain in right.

## 2018-12-16 ENCOUNTER — Ambulatory Visit: Payer: Medicare Other | Admitting: Pulmonary Disease

## 2019-01-04 IMAGING — US US EXTREM LOW VENOUS*R*
1 series · 13 of 24 positions shown · non-contrast
Comparison: None.

CLINICAL DATA: Right ankle swelling. Recent reduction of dislocated
right hip prosthesis.



[Series 1: us extrem low venous*right* · 0.08mm/px · 13 of 34 slices shown]
[im 1/34]
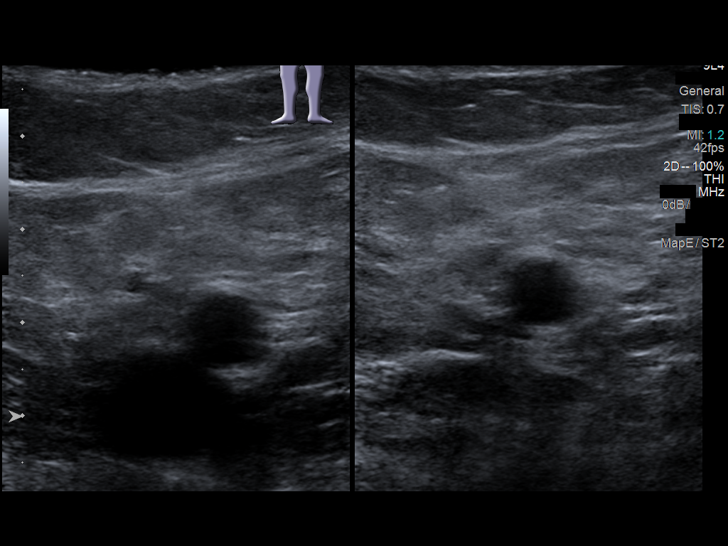
[im 3/34]
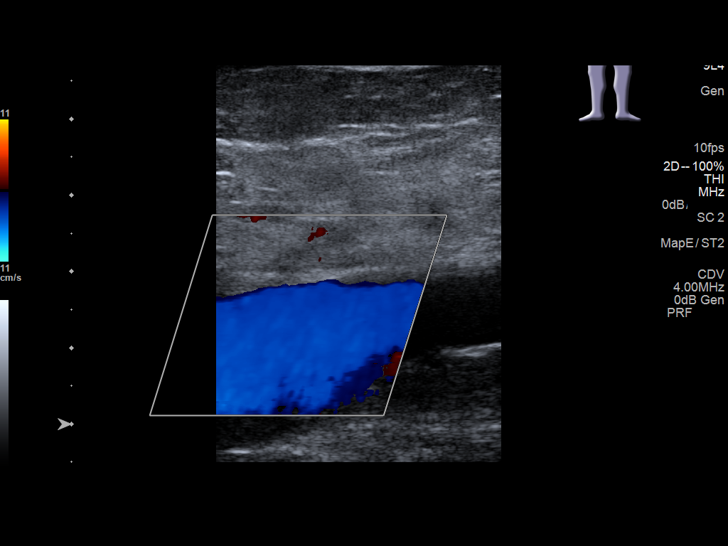
[im 6/34]
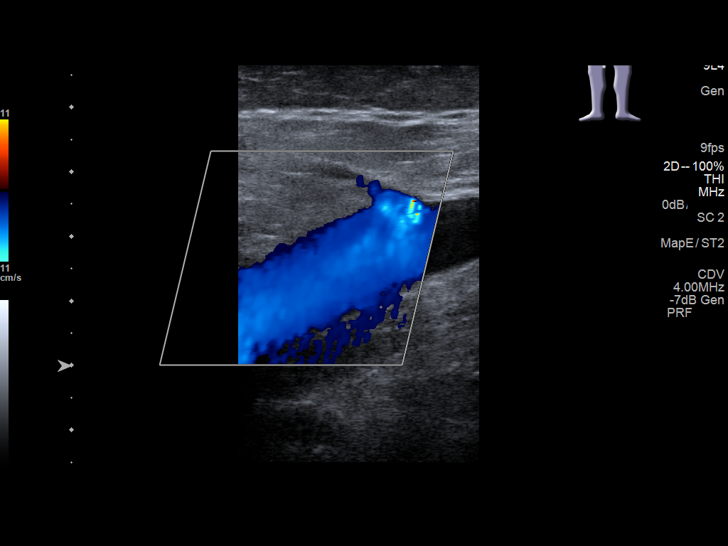
[im 9/34]
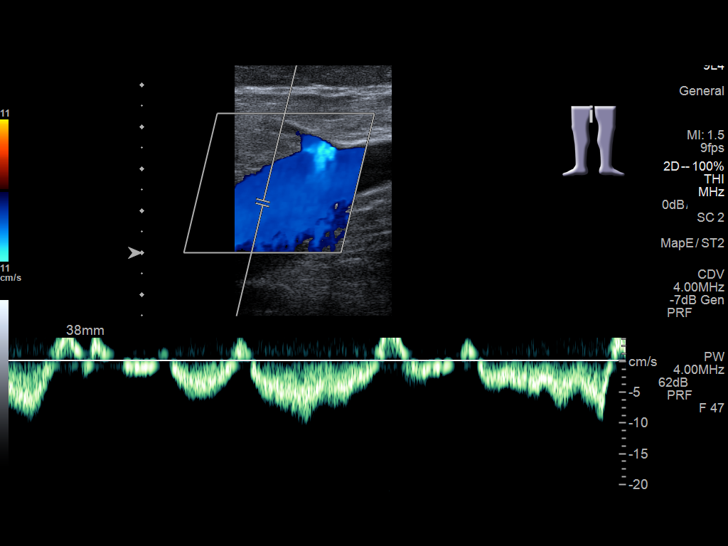
[im 12/34]
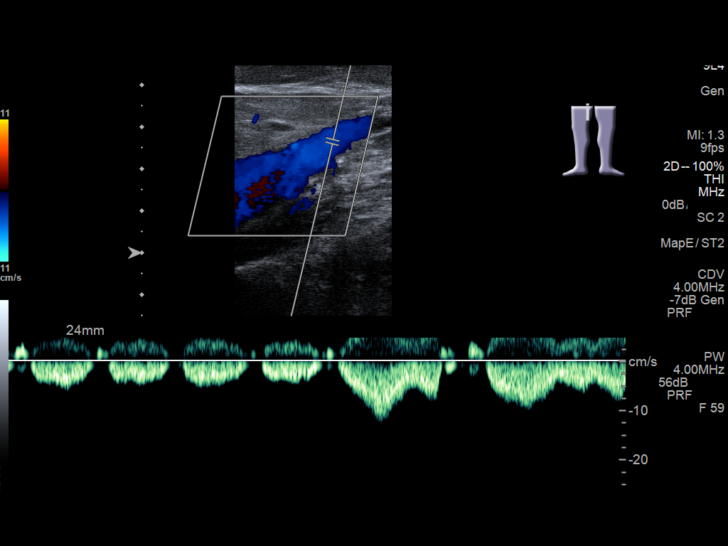
[im 15/34]
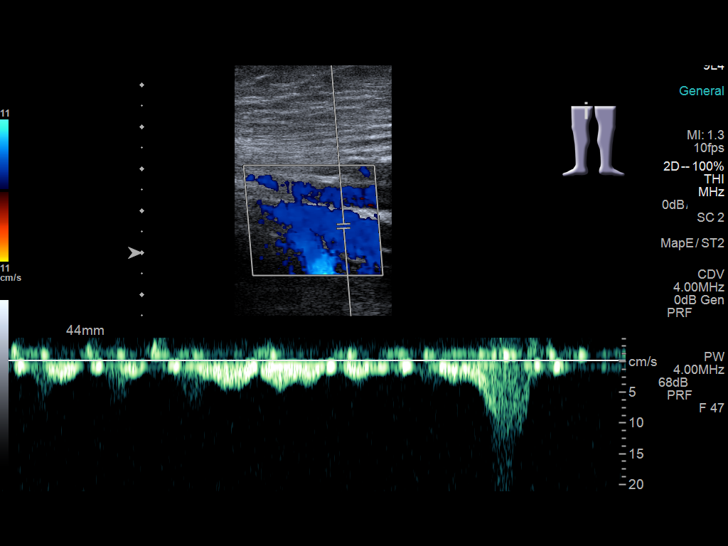
[im 18/34]
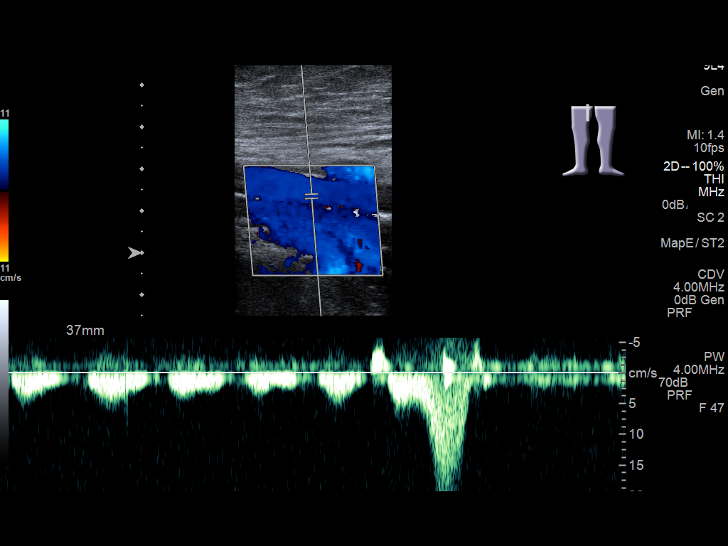
[im 19/34]
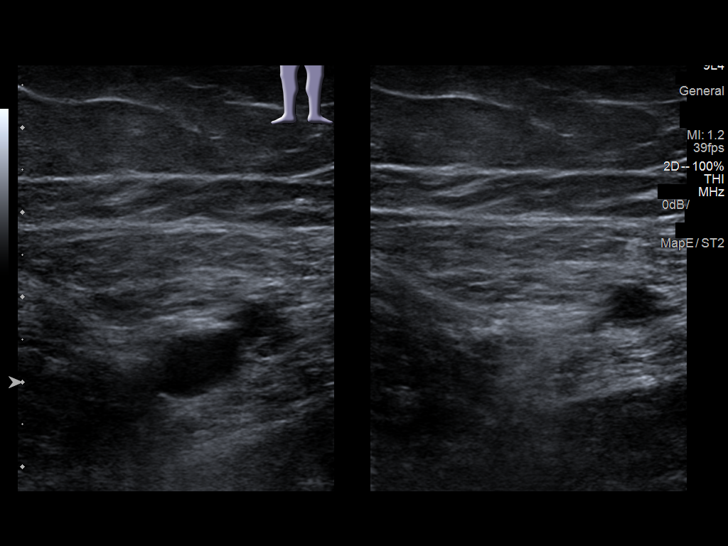
[im 22/34]
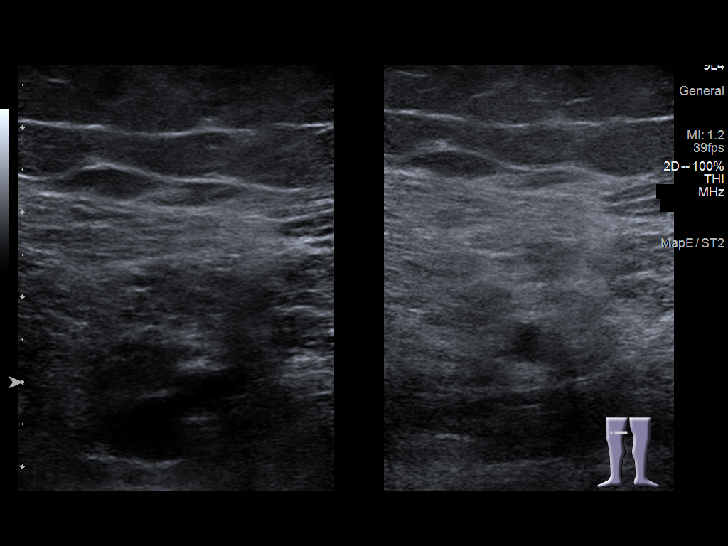
[im 25/34]
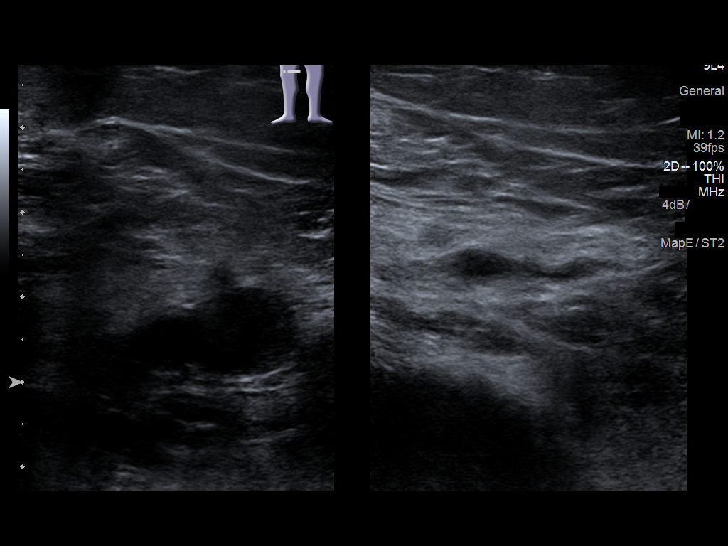
[im 28/34]
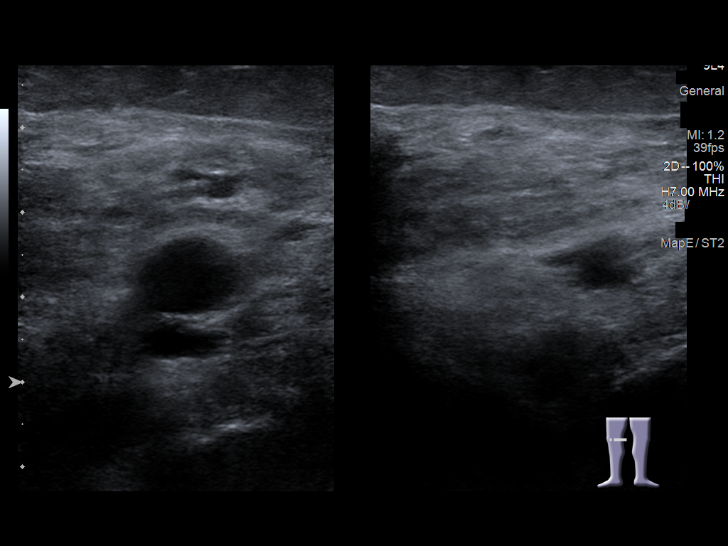
[im 31/34]
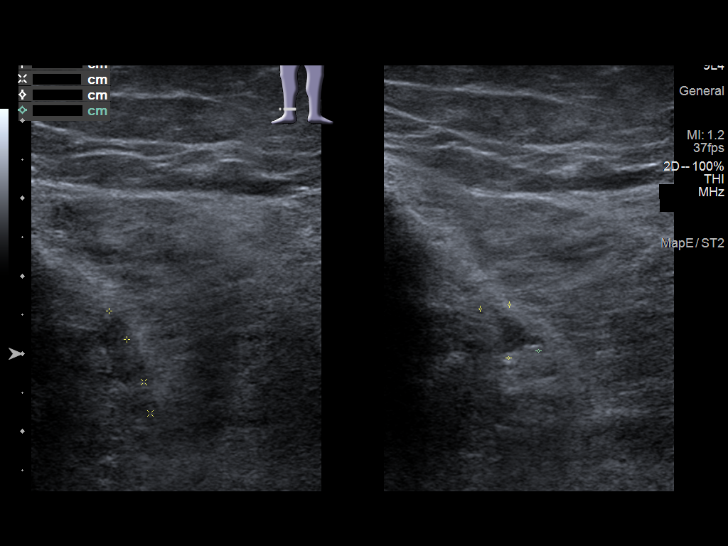
[im 34/34]
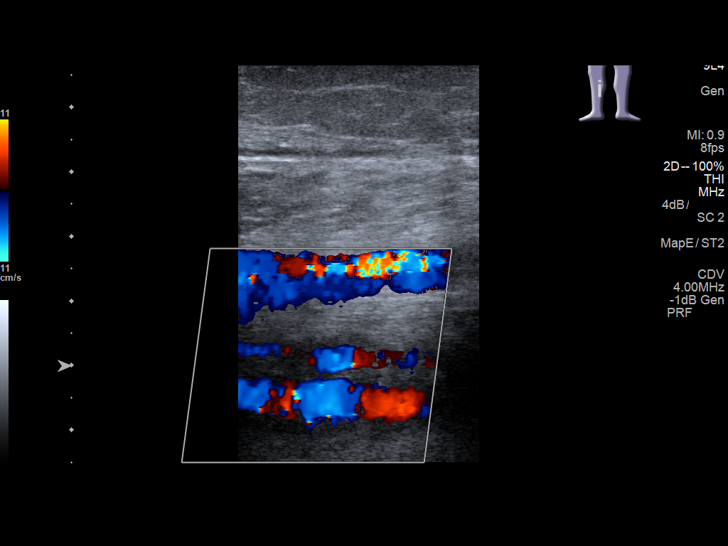

[13 of 24 positions shown; findings below may reference images not displayed]

FINDINGS: Contralateral Common Femoral Vein: Respiratory phasicity is normal
and symmetric with the symptomatic side. No evidence of thrombus.
Normal compressibility.

Common Femoral Vein: No evidence of thrombus. Normal
compressibility, respiratory phasicity and response to augmentation.

Saphenofemoral Junction: No evidence of thrombus. Normal
compressibility and flow on color Doppler imaging.

Profunda Femoral Vein: No evidence of thrombus. Normal
compressibility and flow on color Doppler imaging.

Femoral Vein: No evidence of thrombus. Normal compressibility,
respiratory phasicity and response to augmentation.

Popliteal Vein: No evidence of thrombus. Normal compressibility,
respiratory phasicity and response to augmentation.

Calf Veins: No evidence of thrombus. Normal compressibility and flow
on color Doppler imaging.

Superficial Great Saphenous Vein: No evidence of thrombus. Normal
compressibility.

Venous Reflux:  None.

Other Findings: No evidence of superficial thrombophlebitis or
abnormal fluid collection.
IMPRESSION: No evidence of right lower extremity deep venous thrombosis.

## 2019-01-04 IMAGING — MR MR SHOULDER*L* W/O CM
5 series · 40 of 40 positions shown · non-contrast
Comparison: None.

CLINICAL DATA: Left shoulder pain for several months. Limited range
of motion.

EXAM:
MRI OF THE LEFT SHOULDER WITHOUT CONTRAST
TECHNIQUE: Multiplanar, multisequence MR imaging of the shoulder was performed.
No intravenous contrast was administered.

[Series 3: PD fat-sat · axial · 4.0mm · 0.59mm/px · z∈[-25,+63]mm · 8 of 21 slices shown (1 of 2)]
[im 1/21]
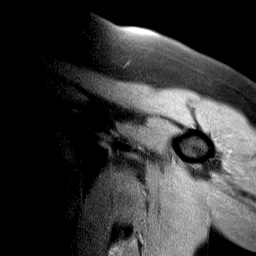
[im 3/21]
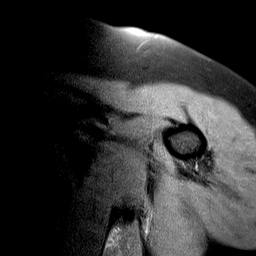
[im 6/21]
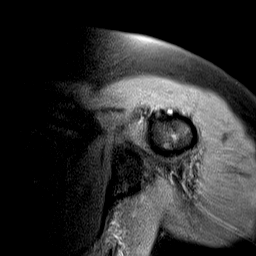
[im 9/21]
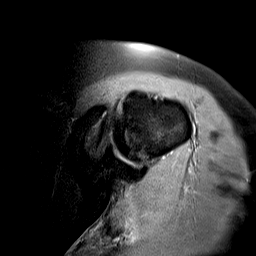
[im 12/21]
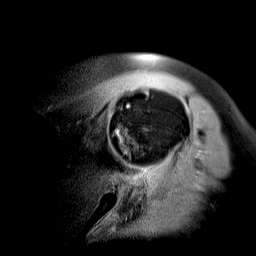
[im 15/21]
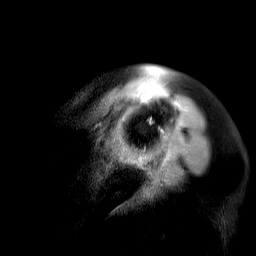
[im 18/21]
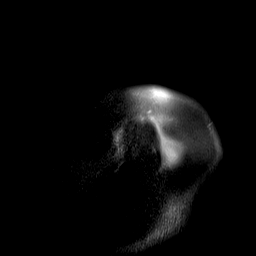
[im 21/21]
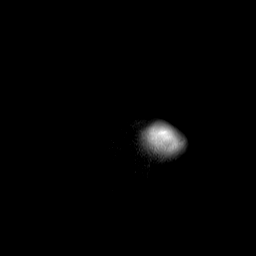

[Series 5: PD fat-sat · oblique · 4.0mm · 0.59mm/px · 8 of 21 slices shown (2 of 2)]
[im 1/21]
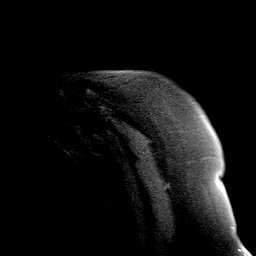
[im 3/21]
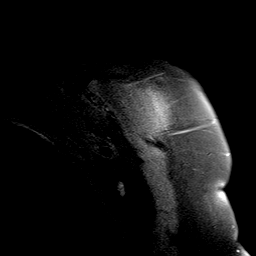
[im 6/21]
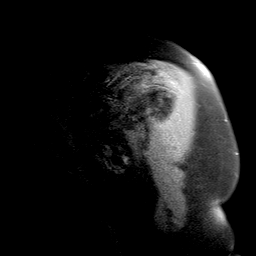
[im 9/21]
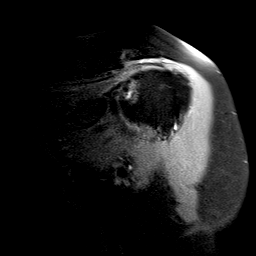
[im 12/21]
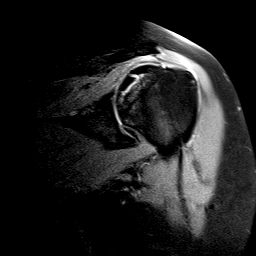
[im 15/21]
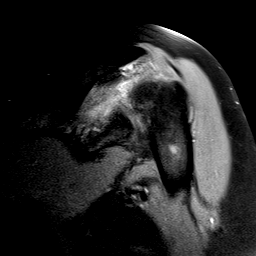
[im 18/21]
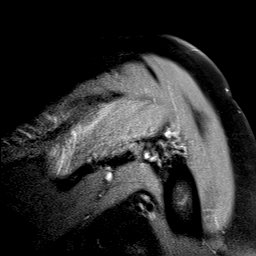
[im 21/21]
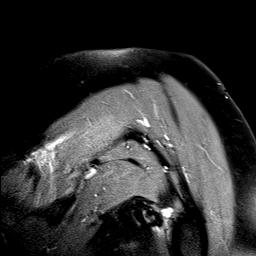

[Series 6: T1 · oblique · 4.0mm · 0.59mm/px · 8 of 23 slices shown]
[im 1/23]
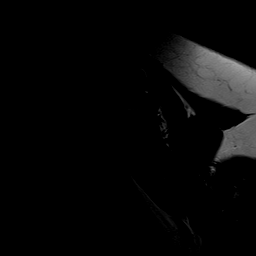
[im 4/23]
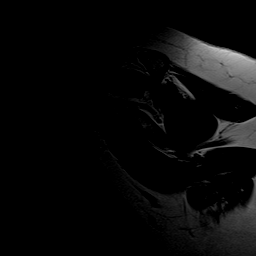
[im 7/23]
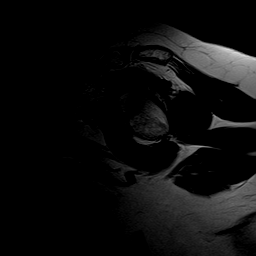
[im 10/23]
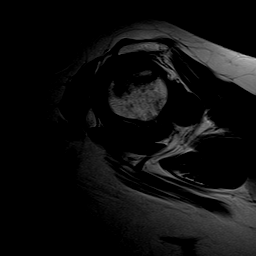
[im 13/23]
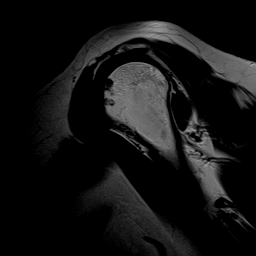
[im 16/23]
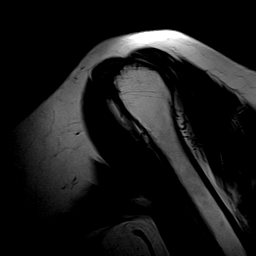
[im 19/23]
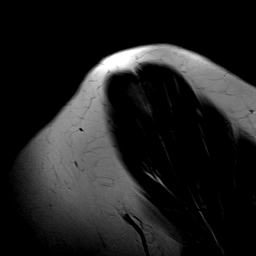
[im 23/23]
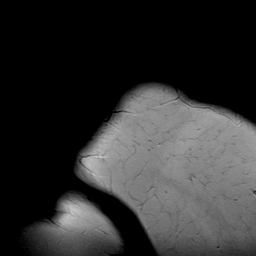

[Series 7: T2 fat-sat · oblique · 4.0mm · 0.59mm/px · 8 of 23 slices shown (1 of 2)]
[im 1/23]
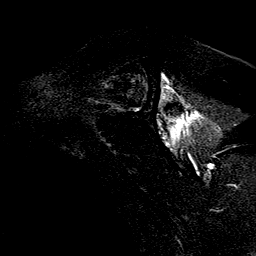
[im 4/23]
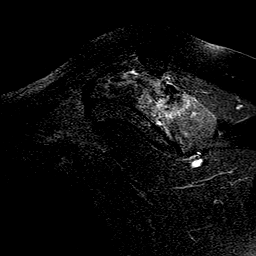
[im 7/23]
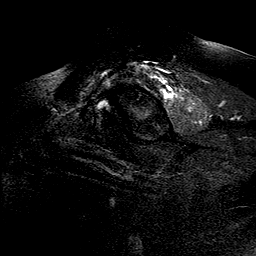
[im 10/23]
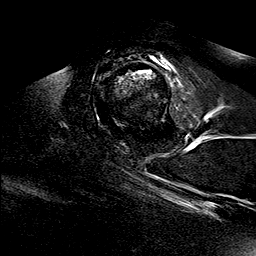
[im 13/23]
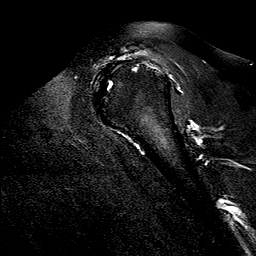
[im 16/23]
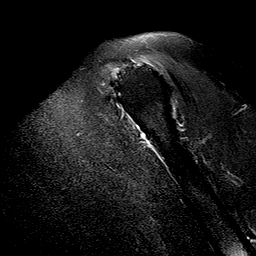
[im 19/23]
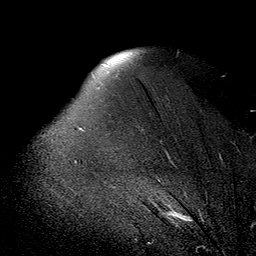
[im 23/23]
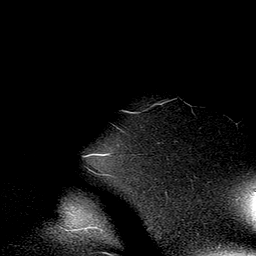

[Series 8: T2 fat-sat · oblique · 4.0mm · 0.59mm/px · 8 of 21 slices shown (2 of 2)]
[im 1/21]
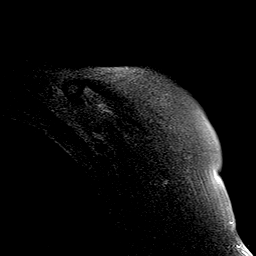
[im 3/21]
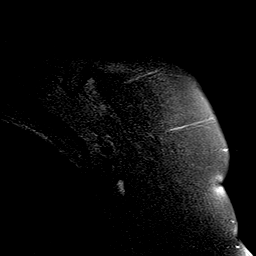
[im 6/21]
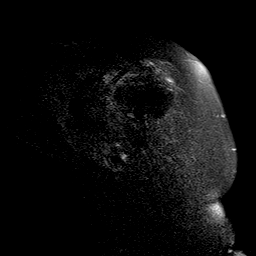
[im 9/21]
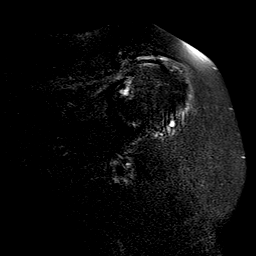
[im 12/21]
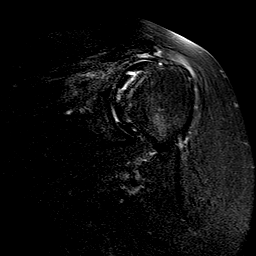
[im 15/21]
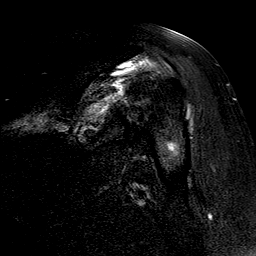
[im 18/21]
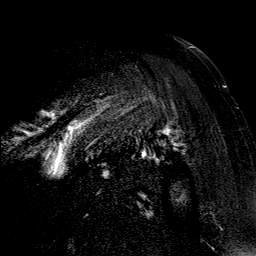
[im 21/21]
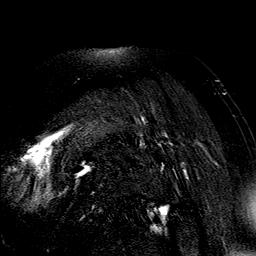

[40 of 40 positions shown; findings below may reference images not displayed]

FINDINGS: Examination is limited by motion artifact.

Rotator cuff: Large full-thickness retracted rotator cuff tear. The
infraspinatus tendon is torn and retracted approximately 3 cm. Most
of the supraspinatus tendon is also torn and retracted a maximum of
10 mm. The subscapularis tendon appears intact. Moderate
tendinopathy.

Muscles: Marked edema like signal abnormality and fluid tracking
back along and around the infraspinatus muscle. No fatty atrophy of
the supraspinatus muscle.

Biceps long head: Difficult to identify the entire intra-articular
portion of the long head biceps tendon for certain but I think it is
intact.

Acromioclavicular Joint: No degenerative changes. Type 1-2 acromion.
No lateral downsloping or undersurface spurring.

Glenohumeral Joint: Moderate degenerative changes with small joint
effusion and mild synovitis. The humeral head is riding high in the
glenoid fossa.

Labrum:  No obvious labral tears.

Bones: Moderate-sized focus of AVN involving the humeral head.. This
is likely chronic as there is no surrounding marrow edema. There is
fluid surrounding part of the lesions suggesting it is loose or
potentially loose.

Other: Expected fluid in the subacromial/subdeltoid bursa.
IMPRESSION: 1. Large full-thickness retracted rotator cuff tear as detailed
above. There also significant edema/inflammation/fluid tracking back
along the infraspinatus muscle.
2. Intact long head biceps tendon and grossly normal glenoid labrum.
3. Moderate-sized focus of AVN involving the humeral head.
4. Glenohumeral joint degenerative changes with small joint effusion
and mild synovitis.

## 2019-01-05 ENCOUNTER — Other Ambulatory Visit: Payer: Self-pay

## 2019-01-05 ENCOUNTER — Telehealth: Payer: Self-pay | Admitting: Pulmonary Disease

## 2019-01-05 NOTE — Telephone Encounter (Signed)
Pt is aware of date/time of covid test.   

## 2019-01-06 ENCOUNTER — Ambulatory Visit (INDEPENDENT_AMBULATORY_CARE_PROVIDER_SITE_OTHER): Payer: Medicare Other | Admitting: Pulmonary Disease

## 2019-01-06 DIAGNOSIS — R05 Cough: Secondary | ICD-10-CM | POA: Diagnosis not present

## 2019-01-06 DIAGNOSIS — K219 Gastro-esophageal reflux disease without esophagitis: Secondary | ICD-10-CM | POA: Diagnosis not present

## 2019-01-06 DIAGNOSIS — J849 Interstitial pulmonary disease, unspecified: Secondary | ICD-10-CM

## 2019-01-06 DIAGNOSIS — J449 Chronic obstructive pulmonary disease, unspecified: Secondary | ICD-10-CM | POA: Diagnosis not present

## 2019-01-06 DIAGNOSIS — R059 Cough, unspecified: Secondary | ICD-10-CM

## 2019-01-06 NOTE — Patient Instructions (Signed)
1.  We will start some low-dose prednisone you will take 2 tablets daily (20 mg) for 1 week then decrease 1 tablet daily (10 mg) until we see you again.  2.  We will start some Pulmicort (budesonide) via nebulizer twice a day hopefully this will help [place] get you off the prednisone sooner.  3.  Stop Breo Ellipta.  4.  Continue Spiriva for now and will reassess when you come back.

## 2019-01-06 NOTE — Progress Notes (Addendum)
Subjective:    Patient ID: Kerri Carter, female    DOB: 03-Sep-1940, 78 y.o.   MRN: [id number] Virtual Visit Via Video or Telephone Note:   This visit type was conducted due to national recommendations for restrictions regarding the COVID-19 pandemic .  This format is felt to be most appropriate for this patient at this time.  All issues noted in this document were discussed and addressed.  No physical exam was performed (except for noted visual exam findings with Video Visits).    I connected with Kerri Carter by telephone at 11:55 hrs. and verified that I was speaking with the correct person using two identifiers. Location patient: home Location provider: [facility] Persons participating in the virtual visit: patient, physician   I discussed the limitations, risks, security and privacy concerns of performing an evaluation and management service by video and the availability of in person appointments. The patient expressed understanding and agreed to proceed.  HPI Kerri Carter is a 78 year old former smoker who has asthma COPD overlap syndrome deviously followed by Dr. Simonne Maffucci.  She has been plagued with a nonproductive cough since November 2019.  During her last visit on 28 September we increased her Protonix to twice a day and this has helped some with her cough.  She is also noted to have mild interstitial lung disease likely related to coexisting rheumatoid arthritis.  Patient reports compliant with N-acetylcysteine which was recommended during her last visit.  She does have issues with cough after using her inhalers.  She feels that when she uses her nebulizer the cough improves dramatically.  Pulmonary function testing is due on 8 December.  She has not had any chest pain, no paroxysmal nocturnal dyspnea.  No lower extremity edema.   Review of Systems A 10 point review of systems was performed and it is as noted above otherwise negative.    Objective:   Physical Exam  No physical exam due to this being a telephone visit.  Patient did not have any difficulty with speech speech was fluent and clear.      Assessment & Plan:   1.  Cough: Somewhat improved after increasing PPI to twice a day.  She has however noted worsening after use of dry powder inhaler.  Her pulmonary function has declined from prior.  She does note that the symptom is improved with nebulizers.  At this point I think it would be prudent to switch her to nebulizer medications as noted below.  Her cough is also prednisone responsive.  2.  Asthma/COPD overlap syndrome: She has moderate defect.  Her function has definitely declined from October 2017.  She is having difficulty with metered-dose inhalers and dry powder inhalers.  These actually aggravate her cough.  She notes that the symptom is not as pronounced with nebulizer treatments.  We will discontinue Breo Ellipta.  We will continue Spiriva for now but will place her on Pulmicort via nebulizer twice a day in hopes to help her get off of prednisone.  He was placed on prednisone 20 mg daily for 1 week and she will decrease to 10 mg daily until seen again. We will see her in follow-up on 29 December to reassess this issue.  3.  Interstitial lung disease: Mild by high-resolution CT scan of the chest.  Continue N-acetylcysteine.  4.  Gastroesophageal reflux disease: Continue Protonix twice a day for now.  Total time of visit 18 minutes  C. Derrill Kay, MD [facility]  This note  was dictated using voice recognition software/Dragon.  Despite best efforts to proofread, errors can occur which can change the meaning.  Any change was purely unintentional.

## 2019-01-09 ENCOUNTER — Other Ambulatory Visit: Payer: Self-pay

## 2019-01-09 ENCOUNTER — Other Ambulatory Visit
Admission: RE | Admit: 2019-01-09 | Discharge: 2019-01-09 | Disposition: A | Discharge status: Home / Self Care | Payer: Medicare Other | Source: Ambulatory Visit | Attending: Pulmonary Disease | Admitting: Pulmonary Disease

## 2019-01-09 ENCOUNTER — Telehealth: Payer: Self-pay | Admitting: Pulmonary Disease

## 2019-01-09 DIAGNOSIS — Z01812 Encounter for preprocedural laboratory examination: Secondary | ICD-10-CM | POA: Diagnosis present

## 2019-01-09 DIAGNOSIS — Z20828 Contact with and (suspected) exposure to other viral communicable diseases: Secondary | ICD-10-CM | POA: Insufficient documentation

## 2019-01-09 DIAGNOSIS — J849 Interstitial pulmonary disease, unspecified: Secondary | ICD-10-CM

## 2019-01-09 MED ORDER — PREDNISONE 10 MG PO TABS: ORAL_TABLET | ORAL | 0 refills | Status: DC

## 2019-01-09 NOTE — Telephone Encounter (Signed)
Spoke to pt, who stated that her pharmacy did not receive Rx for prednisone and pulmicort.  Rx for prednisone has been sent to preferred pharmacy.  LG, please advise on dosage for pulmicort? Thanks

## 2019-01-10 ENCOUNTER — Emergency Department: Payer: Self-pay

## 2019-01-10 ENCOUNTER — Emergency Department
Admission: EM | Admit: 2019-01-10 | Discharge: 2019-01-10 | Disposition: A | Discharge status: Home / Self Care | Payer: Self-pay | Attending: Emergency Medicine | Admitting: Emergency Medicine

## 2019-01-10 ENCOUNTER — Other Ambulatory Visit: Payer: Self-pay

## 2019-01-10 ENCOUNTER — Ambulatory Visit: Payer: Medicare Other | Attending: Pulmonary Disease

## 2019-01-10 DIAGNOSIS — S51012A Laceration without foreign body of left elbow, initial encounter: Secondary | ICD-10-CM | POA: Insufficient documentation

## 2019-01-10 DIAGNOSIS — W0110XA Fall on same level from slipping, tripping and stumbling with subsequent striking against unspecified object, initial encounter: Secondary | ICD-10-CM | POA: Insufficient documentation

## 2019-01-10 DIAGNOSIS — I129 Hypertensive chronic kidney disease with stage 1 through stage 4 chronic kidney disease, or unspecified chronic kidney disease: Secondary | ICD-10-CM | POA: Insufficient documentation

## 2019-01-10 DIAGNOSIS — Y998 Other external cause status: Secondary | ICD-10-CM | POA: Insufficient documentation

## 2019-01-10 DIAGNOSIS — W19XXXA Unspecified fall, initial encounter: Secondary | ICD-10-CM

## 2019-01-10 DIAGNOSIS — J449 Chronic obstructive pulmonary disease, unspecified: Secondary | ICD-10-CM | POA: Diagnosis present

## 2019-01-10 DIAGNOSIS — N183 Chronic kidney disease, stage 3 unspecified: Secondary | ICD-10-CM | POA: Insufficient documentation

## 2019-01-10 DIAGNOSIS — Z87891 Personal history of nicotine dependence: Secondary | ICD-10-CM | POA: Insufficient documentation

## 2019-01-10 DIAGNOSIS — Z79899 Other long term (current) drug therapy: Secondary | ICD-10-CM | POA: Insufficient documentation

## 2019-01-10 DIAGNOSIS — R270 Ataxia, unspecified: Secondary | ICD-10-CM | POA: Insufficient documentation

## 2019-01-10 DIAGNOSIS — Y9389 Activity, other specified: Secondary | ICD-10-CM | POA: Insufficient documentation

## 2019-01-10 DIAGNOSIS — Y92511 Restaurant or cafe as the place of occurrence of the external cause: Secondary | ICD-10-CM | POA: Insufficient documentation

## 2019-01-10 LAB — SARS CORONAVIRUS 2 (TAT 6-24 HRS): SARS Coronavirus 2: NEGATIVE

## 2019-01-10 MED ORDER — ALBUTEROL SULFATE (2.5 MG/3ML) 0.083% IN NEBU
2.5000 mg | INHALATION_SOLUTION | Freq: Once | RESPIRATORY_TRACT | Status: AC
  Administered 2019-01-10: 2.5 mg via RESPIRATORY_TRACT
  Filled 2019-01-10: qty 3

## 2019-01-10 MED ORDER — LIDOCAINE HCL (PF) 1 % IJ SOLN
10.0000 mL | Freq: Once | INTRAMUSCULAR | Status: DC
  Filled 2019-01-10: qty 10

## 2019-01-10 MED ORDER — ACETAMINOPHEN 500 MG PO TABS
1000.0000 mg | ORAL_TABLET | Freq: Once | ORAL | Status: AC
  Administered 2019-01-10: 1000 mg via ORAL
  Filled 2019-01-10: qty 2

## 2019-01-10 NOTE — Discharge Instructions (Addendum)
Your CT scans were negative.  You should have your sutures removed in 10 to 14 days.  Keep an eye on the area to make sure does not develop any redness to suggest infection.  Return to the ER for any other concerns.

## 2019-01-10 NOTE — Telephone Encounter (Signed)
ATC pt-no answer with no option to leave vm. Will call back.  

## 2019-01-10 NOTE — Telephone Encounter (Signed)
Pulmicort 0.25 mg BID via neb

## 2019-01-10 NOTE — ED Triage Notes (Signed)
Pt comes into the ED via EMS from [facility], states she was walking into [facility] with her husband and tripped on the threshold with both feet and went face forward into the glass door. Pt has a noted hematoma to the forehead, having neck, HA and mid back pain. Denies LOC. States she is not on any blood thinners, pt has brace on the left arm from previous BL clavicle fx. Pt is a/ox4 on arrival

## 2019-01-10 NOTE — ED Triage Notes (Signed)
Pt in via EMS from [facility]. EMS reports she walked in and fell and now with hematoma on her forehead. Pt concerned about a concussion. No LOC. Pt not on blood thinners.  Pt left arm in sling from a previous fall and has clavicle fractures on both sides.

## 2019-01-10 NOTE — ED Provider Notes (Signed)
[facility] Emergency Department Provider Note  ____________________________________________   First MD Initiated Contact with Patient 01/10/19 1658     (approximate)  I have reviewed the triage vital signs and the nursing notes.   HISTORY  Chief Complaint Fall    HPI Kerri Carter is a 78 y.o. female with CKD, COPD who has had a recent fall has clavicle fracture of the left sling on now presents for fall.  Patient states that she was going into a restaurant when she tripped on the edge of the door.  She fell forward and hit her head and her left elbow.  She is endorsing some pain in the head and the back of her neck.  Pain is moderate, constant, nothing makes it better, nothing makes it worse.  Denies any LOC.  Not on blood thinners.  She also has little bit of pain on her left elbow.  She is not able to take the tetanus shot.          Past Medical History:  Diagnosis Date   Anxiety    Asthma    Bipolar disorder ([place])    Cataract    s/p b/l repair    Chicken pox    CKD (chronic kidney disease)    CKD (chronic kidney disease), stage III    a. s/p R nephrectomy.   Conversion disorder    COPD (chronic obstructive pulmonary disease) (HCC)    Depression    Essential hypertension    GERD (gastroesophageal reflux disease)    Hyperlipidemia    Inflammatory arthritis    a. hands/carpal tunnel.  b. Low titer rheumatoid factor. c. Negative anti-CCP antibodies. d. Plaquenil.   Non-Obstructive CAD    a. 07/2009 Cath ([facility]): nonobs dzs;  b. 03/2011 Cath [facility]): nonobs dzs.   Osteoarthritis    a. Knees.   PUD (peptic ulcer disease)    S/P right hip fracture    11/01/16 s/p repair   Shoulder pain    Sleep apnea    no cpap   Spinal stenosis at L4-L5 level    severe with L4/L5 anterolisthesis grade 1 anterolisthesis    Toxic maculopathy    Valvular heart disease    a. 07/2015 Echo: EF 55-60%, Mild AI, AS, MR, and TR.    Patient  Active Problem List   Diagnosis Date Noted   Pain due to onychomycosis of toenails of both feet 12/05/2018   Hav (hallux abducto valgus), unspecified laterality 12/05/2018   Status post reverse arthroplasty of shoulder, left 07/26/2018   Small airways disease 07/07/2018   Fatigue 07/07/2018   Avascular necrosis of left shoulder due to adverse effect of steroid therapy ([place]) 01/20/2018   Status post reverse total shoulder replacement, right 11/04/2017   Leukocytosis 10/19/2017   Allergic rhinitis 09/28/2017   Rotator cuff tendinitis, right 07/26/2017   Strain of right hip 02/26/2017   History of fracture of left hip 01/15/2017   Status post lumbar spinal fusion 01/15/2017   Dislocation of hip joint prosthesis ([place]) 01/08/2017   Status post hip hemiarthroplasty 01/08/2017   Leg swelling 12/15/2016   Hip fracture ([place]) 10/31/2016   Polyneuropathy 10/28/2016   Left foot pain 10/19/2016   Spinal stenosis of lumbar region 09/15/2016   Osteoporosis 08/27/2016   Prediabetes 08/25/2016   Mixed bipolar I disorder ([place]) 10/09/2015   CKD (chronic kidney disease), stage III    Essential hypertension    Conversion disorder 08/04/2015   Hyperlipidemia 07/17/2015   Toxic  maculopathy from plaquenil in therapeutic use 07/17/2015   Macular degeneration 07/17/2015   Insomnia 07/17/2015   Rheumatoid arthritis ([place]) 12/05/2014   Cough 05/02/2012   Valvular heart disease 10/13/2011   COPD (chronic obstructive pulmonary disease) ([place]) 09/09/2011   OSA (obstructive sleep apnea) 09/09/2011   Nocturnal hypoxemia 09/09/2011   Gastroesophageal reflux disease 02/24/2011   Single kidney 02/24/2011    Past Surgical History:  Procedure Laterality Date   APPENDECTOMY     BACK SURGERY     BUNIONECTOMY Right    CATARACT EXTRACTION, BILATERAL     CESAREAN SECTION     x1   CHOLECYSTECTOMY N/A 05/11/2016   Procedure: LAPAROSCOPIC CHOLECYSTECTOMY;  Surgeon:  Florene Glen, MD;  Location: [facility] ORS;  Service: General;  Laterality: N/A;   COLONOSCOPY WITH PROPOFOL N/A 04/02/2016   Procedure: COLONOSCOPY WITH PROPOFOL;  Surgeon: Jonathon Bellows, MD;  Location: [facility] ENDOSCOPY;  Service: Endoscopy;  Laterality: N/A;   ENDOSCOPIC RETROGRADE CHOLANGIOPANCREATOGRAPHY (ERCP) WITH PROPOFOL N/A 05/08/2016   Procedure: ENDOSCOPIC RETROGRADE CHOLANGIOPANCREATOGRAPHY (ERCP) WITH PROPOFOL;  Surgeon: Lucilla Lame, MD;  Location: [facility] ENDOSCOPY;  Service: Endoscopy;  Laterality: N/A;   ERCP     with biliary spincterotomy 05/08/16 Dr. Allen Norris for choledocholithiasis    ESOPHAGEAL DILATION  04/02/2016   Procedure: ESOPHAGEAL DILATION;  Surgeon: Jonathon Bellows, MD;  Location: [facility] ENDOSCOPY;  Service: Endoscopy;;   ESOPHAGOGASTRODUODENOSCOPY (EGD) WITH PROPOFOL N/A 04/02/2016   Procedure: ESOPHAGOGASTRODUODENOSCOPY (EGD) WITH PROPOFOL;  Surgeon: Jonathon Bellows, MD;  Location: [facility] ENDOSCOPY;  Service: Endoscopy;  Laterality: N/A;   HIP ARTHROPLASTY Right 11/01/2016   Procedure: ARTHROPLASTY BIPOLAR HIP (HEMIARTHROPLASTY);  Surgeon: Corky Mull, MD;  Location: [facility] ORS;  Service: Orthopedics;  Laterality: Right;   NEPHRECTOMY  1988   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   osteoporosis     noted DEXA 08/19/16    REPLACEMENT TOTAL KNEE Right    REVERSE SHOULDER ARTHROPLASTY Right 11/04/2017   Procedure: REVERSE SHOULDER ARTHROPLASTY;  Surgeon: Corky Mull, MD;  Location: [facility] ORS;  Service: Orthopedics;  Laterality: Right;   REVERSE SHOULDER ARTHROPLASTY Left 07/26/2018   Procedure: REVERSE SHOULDER ARTHROPLASTY;  Surgeon: Corky Mull, MD;  Location: [facility] ORS;  Service: Orthopedics;  Laterality: Left;   TONSILLECTOMY     TOTAL HIP ARTHROPLASTY  12/10/11   [facility] left hip   TOTAL HIP ARTHROPLASTY Bilateral    TUBAL LIGATION      Prior to Admission medications   Medication Sig Start Date End Date Taking? Authorizing Provider  Acetylcysteine 600  MG CAPS Take 1 capsule (600 mg total) by mouth 2 (two) times daily with a meal. 11/02/18   [name], MD  Adalimumab (HUMIRA PEN) 40 MG/0.4ML PNKT Inject 40 mg into the skin every 14 (fourteen) days.     [provider]  albuterol (VENTOLIN HFA) 108 (90 Base) MCG/ACT inhaler Inhale 2 puffs into the lungs every 6 (six) hours as needed. Patient taking differently: Inhale 2 puffs into the lungs every 6 (six) hours as needed for wheezing or shortness of breath.  11/25/15   Juanito Doom, MD  ALPRAZolam Duanne Moron) 0.25 MG tablet Take 1 tablet (0.25 mg total) by mouth at bedtime as needed for anxiety. 10/24/18   Rainey Pines, MD  BYSTOLIC 10 MG tablet TAKE 1 TABLET BY MOUTH EVERY DAY 09/26/18   Juanito Doom, MD  clotrimazole (LOTRIMIN) 1 % cream Apply 1 application topically 2 (two) times daily as needed (for irritation). 12/22/17  McLean-Scocuzza, Nino Glow, MD  dicyclomine (BENTYL) 10 MG capsule Take 1 capsule (10 mg total) by mouth 4 (four) times daily -  before meals and at bedtime. 10/24/18 01/22/19  Jonathon Bellows, MD  escitalopram (LEXAPRO) 10 MG tablet Take 1 tablet (10 mg total) by mouth daily. 10/24/18   Rainey Pines, MD  fluticasone furoate-vilanterol (BREO ELLIPTA) 200-25 MCG/INH AEPB Inhale 1 puff into the lungs daily. 10/31/18   [name], MD  gabapentin (NEURONTIN) 300 MG capsule Take 2-3 capsules (600-900 mg total) by mouth See admin instructions. Take 600 mg by mouth in the morning and 900 mg at bedtime 08/01/18   McLean-Scocuzza, Nino Glow, MD  ipratropium-albuterol (DUONEB) 0.5-2.5 (3) MG/3ML SOLN Take 3 mLs by nebulization every 8 (eight) hours as needed. 03/25/18   McLean-Scocuzza, Nino Glow, MD  lamoTRIgine (LAMICTAL) 100 MG tablet Take 1 tablet (100 mg total) by mouth 2 (two) times daily. 10/24/18   Rainey Pines, MD  leflunomide (ARAVA) 20 MG tablet Take 1 tablet (20 mg total) by mouth daily. 09/30/17   McLean-Scocuzza, Nino Glow, MD  lovastatin (MEVACOR) 40 MG tablet  Take 1 tablet (40 mg total) by mouth at bedtime. 01/21/18   McLean-Scocuzza, Nino Glow, MD  Melatonin 10 MG TABS Take 10 mg by mouth at bedtime. 08/09/17   Rainey Pines, MD  mirabegron ER (MYRBETRIQ) 50 MG TB24 tablet Take 1 tablet (50 mg total) by mouth at bedtime. 09/19/18   Zara Council A, PA-C  montelukast (SINGULAIR) 10 MG tablet Take 1 tablet (10 mg total) by mouth daily. 06/14/18   McLean-Scocuzza, Nino Glow, MD  multivitamin-lutein [facility]) CAPS capsule Take 1 capsule by mouth 2 (two) times daily.    [provider]  mupirocin ointment (BACTROBAN) 2 % Apply 1 application topically 2 (two) times daily. X 1 week 10/19/17   McLean-Scocuzza, Nino Glow, MD  ondansetron (ZOFRAN) 4 MG tablet Take 1 tablet (4 mg total) by mouth every 8 (eight) hours as needed for nausea or vomiting. 03/10/17   McLean-Scocuzza, Nino Glow, MD  pantoprazole (PROTONIX) 40 MG tablet Take 1 tablet (40 mg total) by mouth 2 (two) times daily. 30 minutes before food. Note reduction in frequency 10/31/18   [name], MD  predniSONE (DELTASONE) 10 MG tablet 2 tablets (20MG ) for one week, then decrease to 10mg  daily until f/u. 01/09/19   [name], MD  QUEtiapine (SEROQUEL) 25 MG tablet Take 1 tablet (25 mg total) by mouth at bedtime. 10/24/18   Rainey Pines, MD  Spacer/Aero-Holding Chambers (AEROCHAMBER MV) inhaler Use as instructed 10/31/18   [name], MD  sucralfate (CARAFATE) 1 g tablet Take 1 tablet (1 g total) by mouth 4 (four) times daily. TAKE 1 TABLET BY MOUTH 4 TIMES A DAY WITH MEALS AND AT BEDTIME 10/27/18 10/22/19  Jonathon Bellows, MD  Teriparatide, Recombinant, (FORTEO) 600 MCG/2.4ML SOLN Inject 0.08 mLs (20 mcg total) into the skin daily. 11/03/16   Max Sane, MD  Tiotropium Bromide Monohydrate (SPIRIVA RESPIMAT) 2.5 MCG/ACT AERS Inhale 2 puffs into the lungs daily. Patient taking differently: Inhale 2 puffs into the lungs at bedtime.  02/24/18   McLean-Scocuzza, Nino Glow, MD     Allergies Ceftin [cefuroxime axetil]; Lisinopril; Morphine; Sulfasalazine; Aspirin; Antihistamines, chlorpheniramine-type; Antivert [meclizine hcl]; Decongestant [pseudoephedrine hcl]; Doxycycline; Polymyxin b; Sulfa antibiotics; Xarelto [rivaroxaban]; Adhesive [tape]; Iodine; Levaquin [levofloxacin in d5w]; and Tetanus toxoids  Family History  Problem Relation Age of Onset   Rheum arthritis Mother    Asthma Mother  Parkinson's disease Mother    Heart disease Mother    Stroke Mother    Hypertension Mother    Heart attack Father    Heart disease Father    Hypertension Father    Diabetes Son    Gout Son    Asthma Sister    Heart disease Sister    Lung cancer Sister    Heart disease Sister    Heart disease Sister    Breast cancer Sister    Heart attack Sister    Heart disease Brother    Heart disease Maternal Grandmother    Diabetes Maternal Grandmother    Colon cancer Maternal Grandmother    Cancer Maternal Grandmother        Hodgkins lymphoma   Heart disease Brother    Alcohol abuse Brother    Depression Brother    Dementia Son     Social History Social History   Tobacco Use   Smoking status: Former Smoker    Packs/day: 0.50    Years: 20.00    Pack years: 10.00    Types: Cigarettes    Quit date: 02/02/1974    Years since quitting: 44.9   Smokeless tobacco: Never Used  Substance Use Topics   Alcohol use: No   Drug use: No      Review of Systems Constitutional: No fever/chills Eyes: No visual changes. ENT: No sore throat. Cardiovascular: Denies chest pain. Respiratory: Denies shortness of breath. Gastrointestinal: No abdominal pain.  No nausea, no vomiting.  No diarrhea.  No constipation. Genitourinary: Negative for dysuria. Musculoskeletal: Negative for back pain.  Positive elbow pain Skin: Negative for rash. Neurological: Positive headache, no focal weakness or numbness. All other ROS  negative ____________________________________________   PHYSICAL EXAM:  VITAL SIGNS: ED Triage Vitals  Enc Vitals Group     BP 01/10/19 1520 (!) 156/65     Pulse Rate 01/10/19 1520 67     Resp 01/10/19 1520 18     Temp 01/10/19 1520 98 F (36.7 C)     Temp Source 01/10/19 1520 Oral     SpO2 01/10/19 1520 95 %     Weight 01/10/19 1521 170 lb (77.1 kg)     Height 01/10/19 1521 5\' 1"  (1.549 m)     Head Circumference --      Peak Flow --      Pain Score 01/10/19 1521 8     Pain Loc --      Pain Edu? --      Excl. in [place]? --     Constitutional: Alert and oriented. GCS 15  Eyes: Conjunctivae are normal. EOMI. Head: Hematoma on her forehead with some bruising now draining into the left eye but there is no tenderness around the orbit. Nose: No congestion/rhinnorhea. Mouth/Throat: Mucous membranes are moist.   Neck: No stridor. Trachea Midline. FROM Cardiovascular: Normal rate, regular rhythm. Grossly normal heart sounds.  Good peripheral circulation. No chest wall tenderness Respiratory: Normal respiratory effort.  No retractions. Lungs CTAB. Gastrointestinal: Soft and nontender. No distention. No abdominal bruits.  Musculoskeletal:   RUE: No point tenderness, deformity or other signs of injury. Radial pulse intact. Neuro intact. Full ROM in joint. LUE: Abrasion on the left elbow with very superficial laceration.  Attempted to probe it and does not seem to go into the joint.  Radial pulse intact. Neuro intact.  Arm in sling. RLE: Small abrasion on the right knee but no tenderness.  Deformity or other signs of injury. DP pulse  intact. Neuro intact. Full ROM in joints. LLE: No point tenderness, deformity or other signs of injury. DP pulse intact. Neuro intact. Full ROM in joints. Neurologic:  Normal speech and language. No gross focal neurologic deficits are appreciated.  Skin:  Skin is warm, dry and intact. No rash noted. Psychiatric: Mood and affect are normal. Speech and behavior  are normal. GU: Deferred   ____________________________________________  RADIOLOGY Robert Bellow, personally viewed and evaluated these images (plain radiographs) as part of my medical decision making, as well as reviewing the written report by the radiologist.  ED MD interpretation:  No fracture.  Official radiology report(s): Ct Head Wo Contrast  Result Date: 01/10/2019 CLINICAL DATA:  78 year old female with history of head trauma from a fall. EXAM: CT HEAD WITHOUT CONTRAST CT CERVICAL SPINE WITHOUT CONTRAST TECHNIQUE: Multidetector CT imaging of the head and cervical spine was performed following the standard protocol without intravenous contrast. Multiplanar CT image reconstructions of the cervical spine were also generated. COMPARISON:  Head CT 10/31/2016. FINDINGS: CT HEAD FINDINGS Brain: Mild cerebral atrophy. Patchy and confluent areas of decreased attenuation are noted throughout the deep and periventricular white matter of the cerebral hemispheres bilaterally, compatible with chronic microvascular ischemic disease. No evidence of acute infarction, hemorrhage, hydrocephalus, extra-axial collection or mass lesion/mass effect. Vascular: No hyperdense vessel or unexpected calcification. Skull: Normal. Negative for fracture or focal lesion. Sinuses/Orbits: Mucoperiosteal thickening and intermediate attenuation secretions in the right maxillary sinus and scattered throughout the ethmoidal sinuses bilaterally. Other: Large amount of high attenuation soft tissue swelling in the frontal scalp measuring approximately 1.2 x 6.0 x 5.4 cm, compatible with a large frontal scalp hematoma. CT CERVICAL SPINE FINDINGS Alignment: Normal. Skull base and vertebrae: No acute fracture. No primary bone lesion or focal pathologic process. Soft tissues and spinal canal: No prevertebral fluid or swelling. No visible canal hematoma. Disc levels: Mild multilevel degenerative disc disease, most severe at C5-C6. Moderate  multilevel facet arthropathy. Upper chest: Negative. Other: None. IMPRESSION: 1. Large frontal scalp hematoma. No underlying displaced skull fracture or evidence of significant acute traumatic injury to the brain. 2. No evidence of significant acute traumatic injury to the cervical spine. 3. Mild cerebral atrophy with chronic microvascular ischemic changes in the cerebral white matter, as above. 4. Chronic paranasal sinus disease, as above. Electronically Signed   By: Vinnie Langton M.D.   On: 01/10/2019 15:50   Ct Cervical Spine Wo Contrast  Result Date: 01/10/2019 CLINICAL DATA:  77 year old female with history of head trauma from a fall. EXAM: CT HEAD WITHOUT CONTRAST CT CERVICAL SPINE WITHOUT CONTRAST TECHNIQUE: Multidetector CT imaging of the head and cervical spine was performed following the standard protocol without intravenous contrast. Multiplanar CT image reconstructions of the cervical spine were also generated. COMPARISON:  Head CT 10/31/2016. FINDINGS: CT HEAD FINDINGS Brain: Mild cerebral atrophy. Patchy and confluent areas of decreased attenuation are noted throughout the deep and periventricular white matter of the cerebral hemispheres bilaterally, compatible with chronic microvascular ischemic disease. No evidence of acute infarction, hemorrhage, hydrocephalus, extra-axial collection or mass lesion/mass effect. Vascular: No hyperdense vessel or unexpected calcification. Skull: Normal. Negative for fracture or focal lesion. Sinuses/Orbits: Mucoperiosteal thickening and intermediate attenuation secretions in the right maxillary sinus and scattered throughout the ethmoidal sinuses bilaterally. Other: Large amount of high attenuation soft tissue swelling in the frontal scalp measuring approximately 1.2 x 6.0 x 5.4 cm, compatible with a large frontal scalp hematoma. CT CERVICAL SPINE FINDINGS Alignment: Normal. Skull base and  vertebrae: No acute fracture. No primary bone lesion or focal pathologic  process. Soft tissues and spinal canal: No prevertebral fluid or swelling. No visible canal hematoma. Disc levels: Mild multilevel degenerative disc disease, most severe at C5-C6. Moderate multilevel facet arthropathy. Upper chest: Negative. Other: None. IMPRESSION: 1. Large frontal scalp hematoma. No underlying displaced skull fracture or evidence of significant acute traumatic injury to the brain. 2. No evidence of significant acute traumatic injury to the cervical spine. 3. Mild cerebral atrophy with chronic microvascular ischemic changes in the cerebral white matter, as above. 4. Chronic paranasal sinus disease, as above. Electronically Signed   By: Vinnie Langton M.D.   On: 01/10/2019 15:50    ____________________________________________   PROCEDURES  Procedure(s) performed (including Critical Care):  Marland KitchenMarland KitchenLaceration Repair  Date/Time: 01/10/2019 6:36 PM Performed by: Darletta Moll, PA-C Authorized by: Vanessa Basco, MD   Consent:    Consent obtained:  Verbal   Consent given by:  Patient   Risks discussed:  Infection, pain, retained foreign body, tendon damage, poor cosmetic result, poor wound healing, vascular damage, nerve damage and need for additional repair   Alternatives discussed:  No treatment Anesthesia (see MAR for exact dosages):    Anesthesia method:  Local infiltration   Local anesthetic:  Lidocaine 1% w/o epi Laceration details:    Location:  Shoulder/arm   Shoulder/arm location:  L elbow   Length (cm):  5   Depth (mm):  2 Repair type:    Repair type:  Simple Exploration:    Wound exploration: wound explored through full range of motion and entire depth of wound probed and visualized     Wound extent: no areolar tissue violation noted, no fascia violation noted, no foreign bodies/material noted and no muscle damage noted   Treatment:    Area cleansed with:  Saline   Amount of cleaning:  Standard Skin repair:    Repair method:  Sutures   Suture size:   4-0   Suture material:  Nylon   Suture technique:  Running   Number of sutures:  1 Approximation:    Approximation:  Close Post-procedure details:    Dressing:  Antibiotic ointment and non-adherent dressing   Patient tolerance of procedure:  Tolerated well, no immediate complications     ____________________________________________   INITIAL IMPRESSION / ASSESSMENT AND PLAN / ED COURSE      Kerri Carter was evaluated in Emergency Department on 01/10/2019 for the symptoms described in the history of present illness. She was evaluated in the context of the global COVID-19 pandemic, which necessitated consideration that the patient might be at risk for infection with the SARS-CoV-2 virus that causes COVID-19. Institutional protocols and algorithms that pertain to the evaluation of patients at risk for COVID-19 are in a state of rapid change based on information released by regulatory bodies including the CDC and federal and state organizations. These policies and algorithms were followed during the patient's care in the ED.    Patient had a mechanical fall and hit her head.  Will get CT scan to evaluate for intracranial hemorrhage.  CT cervical evaluate for cervical fracture.  X-ray to evaluate for elbow fracture.  This was mechanical in nature.  Not syncope.  She denies any chest pain or shortness of breath to suggest ACS. I was able to ambulate patient and she did very well so no concern for fracture of her leg.  CT scans were negative.  X-ray was negative of the elbow.  The elbow lack was probed without evidence of it going into the joint.  A running suture was placed.  Bacitracin and gauze were placed over it.  Patient will follow up for removal in 10-14 days.    I discussed the provisional nature of ED diagnosis, the treatment so far, the ongoing plan of care, follow up appointments and return precautions with the patient and any family or support people present. They expressed  understanding and agreed with the plan, discharged home.  ____________________________________________   FINAL CLINICAL IMPRESSION(S) / ED DIAGNOSES   Final diagnoses:  Fall, initial encounter  Laceration of left elbow, initial encounter      MEDICATIONS GIVEN DURING THIS VISIT:  Medications  lidocaine (PF) (XYLOCAINE) 1 % injection 10 mL (has no administration in time range)  acetaminophen (TYLENOL) tablet 1,000 mg (1,000 mg Oral Given 01/10/19 1737)     ED Discharge Orders    None       Note:  This document was prepared using Dragon voice recognition software and may include unintentional dictation errors.   Vanessa [place], MD 01/10/19 [phone]

## 2019-01-10 NOTE — ED Notes (Signed)
X-ray at bedside

## 2019-01-11 MED ORDER — BUDESONIDE 0.25 MG/2ML IN SUSP: 0.2500 mg | Freq: Two times a day (BID) | RESPIRATORY_TRACT | 11 refills | Status: DC

## 2019-01-11 NOTE — Telephone Encounter (Signed)
Pt is aware of below message and voiced her understanding.  Rx for pulmicort has been printed and placed in LG's folder for signature.  Pt is aware that a DME will need to provide her with this medication.

## 2019-01-11 NOTE — Telephone Encounter (Signed)
ATC pt- unable to leave vm, due to mailbox being full. Will call back.  

## 2019-01-11 NOTE — Telephone Encounter (Signed)
ATC pt on mobile number on file- unable to leave vm, as mailbox is full.

## 2019-01-11 NOTE — Telephone Encounter (Signed)
Patient is returning phone call.  Patient phone number is [phone].

## 2019-01-13 NOTE — Telephone Encounter (Addendum)
Rx has been signed and given to [place] to fax.  Nothing further is needed.

## 2019-01-16 ENCOUNTER — Ambulatory Visit: Payer: Medicare Other | Admitting: Psychiatry

## 2019-01-17 ENCOUNTER — Telehealth: Payer: Self-pay | Admitting: Pulmonary Disease

## 2019-01-17 ENCOUNTER — Other Ambulatory Visit: Payer: Self-pay | Admitting: Gastroenterology

## 2019-01-17 NOTE — Telephone Encounter (Signed)
Called [facility], spoke with [place]. Computers are down at the present time. Unice Cobble will contact patient once computer comes back up and arrange shipment. Carlos Levering Cobb

## 2019-01-17 NOTE — Telephone Encounter (Signed)
ATC patient again, no answer unable to leave message due to Mailbox being full. Rhonda J Cobb

## 2019-01-17 NOTE — Telephone Encounter (Signed)
Called and spoke with patient and she stated that [place] called her and advised that [facility] could not process this Rx.  Spoke with Christal at [facility] and [facility] is able to process Rx.  Rx faxed to [facility]. Rhonda J Cobb

## 2019-01-17 NOTE — Telephone Encounter (Signed)
Spoke to pt, who is requesting update on pulmicort.  This [facility] was [facility] to [facility] on 01/11/2019. Pt has not been contacted regarding this.  Suanne Marker, can you help with this?

## 2019-01-18 ENCOUNTER — Encounter: Payer: Self-pay | Admitting: Pulmonary Disease

## 2019-01-18 NOTE — Telephone Encounter (Signed)
Called and spoke with patient and advised that [facility] will be calling to schedule shipment of medication. Pt is aware. Nothing else needed. Rhonda J Cobb

## 2019-01-19 ENCOUNTER — Other Ambulatory Visit: Payer: Self-pay | Admitting: Internal Medicine

## 2019-01-19 DIAGNOSIS — K219 Gastro-esophageal reflux disease without esophagitis: Secondary | ICD-10-CM

## 2019-01-19 MED ORDER — FAMOTIDINE 20 MG PO TABS: 20.0000 mg | ORAL_TABLET | Freq: Every day | ORAL | 3 refills | Status: DC

## 2019-01-20 ENCOUNTER — Ambulatory Visit (INDEPENDENT_AMBULATORY_CARE_PROVIDER_SITE_OTHER): Payer: Medicare Other | Admitting: Psychiatry

## 2019-01-20 ENCOUNTER — Encounter: Payer: Self-pay | Admitting: Psychiatry

## 2019-01-20 ENCOUNTER — Other Ambulatory Visit: Payer: Self-pay

## 2019-01-20 DIAGNOSIS — F39 Unspecified mood [affective] disorder: Secondary | ICD-10-CM

## 2019-01-20 DIAGNOSIS — F411 Generalized anxiety disorder: Secondary | ICD-10-CM | POA: Diagnosis not present

## 2019-01-20 DIAGNOSIS — F329 Major depressive disorder, single episode, unspecified: Secondary | ICD-10-CM

## 2019-01-20 DIAGNOSIS — F419 Anxiety disorder, unspecified: Secondary | ICD-10-CM | POA: Insufficient documentation

## 2019-01-20 DIAGNOSIS — F3178 Bipolar disorder, in full remission, most recent episode mixed: Secondary | ICD-10-CM

## 2019-01-20 DIAGNOSIS — F32A Depression, unspecified: Secondary | ICD-10-CM

## 2019-01-20 HISTORY — DX: Anxiety disorder, unspecified: F41.9

## 2019-01-20 MED ORDER — LAMOTRIGINE 100 MG PO TABS: 100.0000 mg | ORAL_TABLET | Freq: Two times a day (BID) | ORAL | 0 refills | Status: DC

## 2019-01-20 MED ORDER — QUETIAPINE FUMARATE 25 MG PO TABS: 25.0000 mg | ORAL_TABLET | Freq: Every day | ORAL | 0 refills | Status: DC

## 2019-01-20 MED ORDER — ESCITALOPRAM OXALATE 10 MG PO TABS: 10.0000 mg | ORAL_TABLET | Freq: Every day | ORAL | 0 refills | Status: DC

## 2019-01-20 MED ORDER — ALPRAZOLAM 0.25 MG PO TABS: 0.2500 mg | ORAL_TABLET | Freq: Every evening | ORAL | 0 refills | Status: DC | PRN

## 2019-01-20 NOTE — Progress Notes (Signed)
[facility] MD OP Progress Note  I connected with  Kerri Carter on 01/20/19 by phone and verified that I am speaking with the correct person using two identifiers.   I discussed the limitations of evaluation and management by phone. The patient expressed understanding and agreed to proceed.    01/20/2019 8:45 AM Kerri Carter  MRN:  [id number]  Chief Complaint:  " I am doing fine."  HPI: Patient reported that she has had several falls over the last couple of months.  Her last fall was on December 8 when she went to a restaurant and slipped and fell there.  She informed that she needed 8 stitches on her arm.  She informed that her stitches site still hurts and she takes some Tylenol for that.  She informed that her mood has been stable for the most part.  She has been sleeping well.  She would like to continue same regimen as she feels that without this combination things will go haywire.  Visit Diagnosis:    ICD-10-CM   1. Bipolar 1 disorder, mixed, full remission ([place])  F31.78   2. Anxiety state  F41.1     Past Psychiatric History: Bipolar d/o, anxiety  Past Medical History:  Past Medical History:  Diagnosis Date  . Anxiety   . Asthma   . Bipolar disorder ([place])   . Cataract    s/p b/l repair   . Chicken pox   . CKD (chronic kidney disease)   . CKD (chronic kidney disease), stage III    a. s/p R nephrectomy.  . Conversion disorder   . COPD (chronic obstructive pulmonary disease) ([place])   . Depression   . Essential hypertension   . GERD (gastroesophageal reflux disease)   . Hyperlipidemia   . Inflammatory arthritis    a. hands/carpal tunnel.  b. Low titer rheumatoid factor. c. Negative anti-CCP antibodies. d. Plaquenil.  . Non-Obstructive CAD    a. 07/2009 Cath ([facility]): nonobs dzs;  b. 03/2011 Cath [facility]): nonobs dzs.  . Osteoarthritis    a. Knees.  . PUD (peptic ulcer disease)   . S/P right hip fracture    11/01/16 s/p repair  . Shoulder pain   . Sleep apnea    no cpap  .  Spinal stenosis at L4-L5 level    severe with L4/L5 anterolisthesis grade 1 anterolisthesis   . Toxic maculopathy   . Valvular heart disease    a. 07/2015 Echo: EF 55-60%, Mild AI, AS, MR, and TR.    Past Surgical History:  Procedure Laterality Date  . APPENDECTOMY    . BACK SURGERY    . BUNIONECTOMY Right   . CATARACT EXTRACTION, BILATERAL    . CESAREAN SECTION     x1  . CHOLECYSTECTOMY N/A 05/11/2016   Procedure: LAPAROSCOPIC CHOLECYSTECTOMY;  Surgeon: Florene Glen, MD;  Location: [facility] ORS;  Service: General;  Laterality: N/A;  . COLONOSCOPY WITH PROPOFOL N/A 04/02/2016   Procedure: COLONOSCOPY WITH PROPOFOL;  Surgeon: Jonathon Bellows, MD;  Location: [facility] ENDOSCOPY;  Service: Endoscopy;  Laterality: N/A;  . ENDOSCOPIC RETROGRADE CHOLANGIOPANCREATOGRAPHY (ERCP) WITH PROPOFOL N/A 05/08/2016   Procedure: ENDOSCOPIC RETROGRADE CHOLANGIOPANCREATOGRAPHY (ERCP) WITH PROPOFOL;  Surgeon: Lucilla Lame, MD;  Location: [facility] ENDOSCOPY;  Service: Endoscopy;  Laterality: N/A;  . ERCP     with biliary spincterotomy 05/08/16 Dr. Allen Norris for choledocholithiasis   . ESOPHAGEAL DILATION  04/02/2016   Procedure: ESOPHAGEAL DILATION;  Surgeon: Jonathon Bellows, MD;  Location: [facility] ENDOSCOPY;  Service: Endoscopy;;  .  ESOPHAGOGASTRODUODENOSCOPY (EGD) WITH PROPOFOL N/A 04/02/2016   Procedure: ESOPHAGOGASTRODUODENOSCOPY (EGD) WITH PROPOFOL;  Surgeon: Jonathon Bellows, MD;  Location: [facility] ENDOSCOPY;  Service: Endoscopy;  Laterality: N/A;  . HIP ARTHROPLASTY Right 11/01/2016   Procedure: ARTHROPLASTY BIPOLAR HIP (HEMIARTHROPLASTY);  Surgeon: Corky Mull, MD;  Location: [facility] ORS;  Service: Orthopedics;  Laterality: Right;  . 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 osteoporosis     noted DEXA 08/19/16   . REPLACEMENT TOTAL KNEE Right   . REVERSE SHOULDER ARTHROPLASTY Right 11/04/2017   Procedure: REVERSE SHOULDER ARTHROPLASTY;  Surgeon: Corky Mull, MD;  Location: [facility] ORS;  Service:  Orthopedics;  Laterality: Right;  . REVERSE SHOULDER ARTHROPLASTY Left 07/26/2018   Procedure: REVERSE SHOULDER ARTHROPLASTY;  Surgeon: Corky Mull, MD;  Location: [facility] ORS;  Service: Orthopedics;  Laterality: Left;  . TONSILLECTOMY    . TOTAL HIP ARTHROPLASTY  12/10/11   [facility] left hip  . TOTAL HIP ARTHROPLASTY Bilateral   . TUBAL LIGATION      Family Psychiatric History: see below  Family History:  Family History  Problem Relation Age of Onset  . Rheum arthritis Mother   . Asthma Mother   . Parkinson's disease Mother   . Heart disease Mother   . Stroke Mother   . Hypertension Mother   . Heart attack Father   . Heart disease Father   . Hypertension Father   . Diabetes Son   . Gout Son   . Asthma Sister   . Heart disease Sister   . Lung cancer Sister   . Heart disease Sister   . Heart disease Sister   . Breast cancer Sister   . Heart attack Sister   . Heart disease Brother   . Heart disease Maternal Grandmother   . Diabetes Maternal Grandmother   . Colon cancer Maternal Grandmother   . Cancer Maternal Grandmother        Hodgkins lymphoma  . Heart disease Brother   . Alcohol abuse Brother   . Depression Brother   . Dementia Son     Social History:  Social History   Socioeconomic History  . Marital status: Married    Spouse name: richard  . Number of children: 2  . Years of education: Some Coll  . Highest education level: Some college, no degree  Occupational History  . Occupation: Retired    Comment: retired  Tobacco Use  . Smoking status: Former Smoker    Packs/day: 0.50    Years: 20.00    Pack years: 10.00    Types: Cigarettes    Quit date: 02/02/1974    Years since quitting: 44.9  . Smokeless tobacco: Never Used  Substance and Sexual Activity  . Alcohol use: No  . Drug use: No  . Sexual activity: Not Currently  Other Topics Concern  . Not on file  Social History Narrative   Lives at home in [place] with her husband and grandson.    Right-handed.   6 cups coffee per day.   Social Determinants of Health   Financial Resource Strain: Low Risk   . Difficulty of Paying Living Expenses: Not hard at all  Food Insecurity: No Food Insecurity  . Worried About Charity fundraiser in the Last Year: Never true  . Ran Out of Food in the Last Year: Never true  Transportation Needs:   . Lack of Transportation (Medical): Not on file  . Lack  of Transportation (Non-Medical): Not on file  Physical Activity:   . Days of Exercise per Week: Not on file  . Minutes of Exercise per Session: Not on file  Stress:   . Feeling of Stress : Not on file  Social Connections:   . Frequency of Communication with Friends and Family: Not on file  . Frequency of Social Gatherings with Friends and Family: Not on file  . Attends Religious Services: Not on file  . Active Member of Clubs or Organizations: Not on file  . Attends Archivist Meetings: Not on file  . Marital Status: Not on file    Allergies:  Allergies  Allergen Reactions  . Ceftin [Cefuroxime Axetil] Anaphylaxis  . Lisinopril Anaphylaxis  . Morphine Other (See Comments)    Per patient, low blood pressure issues that requires action to raise it back up. Can take small infrequent doses  . Sulfasalazine Anaphylaxis  . Aspirin Other (See Comments)    Sulfasalazine allergy cross reacts  . Antihistamines, Chlorpheniramine-Type Other (See Comments)    Makes pt hyper  . Antivert [Meclizine Hcl] Other (See Comments)    Bladder will not empty  . Decongestant [Pseudoephedrine Hcl] Other (See Comments)    Makes pt hyper  . Doxycycline Other (See Comments)    GI upset  . Polymyxin B Other (See Comments)    Medication was in eye drops.  . Sulfa Antibiotics Other (See Comments)    Face swelling  . Xarelto [Rivaroxaban] Other (See Comments)    Stomach burning, bleeding, and tar in stool  . Adhesive [Tape] Rash  . Iodine Hives and Rash    Per patient allergy is to contrast dye  only, she is able to use betadine scrubs.  [place] [Levofloxacin In D5w] Rash  . Tetanus Toxoids Rash and Other (See Comments)    Fever and hot to touch at injection site    Metabolic Disorder Labs: Lab Results  Component Value Date   HGBA1C 6.1 (H) 12/01/2018   MPG 128.37 12/01/2018   MPG 114 08/06/2017   No results found for: PROLACTIN Lab Results  Component Value Date   CHOL 147 12/01/2018   TRIG 227 (H) 12/01/2018   HDL 42 12/01/2018   CHOLHDL 3.5 12/01/2018   VLDL 45 (H) 12/01/2018   LDLCALC 60 12/01/2018   LDLCALC 86 08/06/2017   Lab Results  Component Value Date   TSH 1.734 12/01/2018   TSH 2.72 08/06/2017    Therapeutic Level Labs: No results found for: LITHIUM No results found for: VALPROATE No components found for:  CBMZ  Current Medications: Current Outpatient Medications  Medication Sig Dispense Refill  . Acetylcysteine 600 MG CAPS Take 1 capsule (600 mg total) by mouth 2 (two) times daily with a meal. 60 capsule 2  . Adalimumab (HUMIRA PEN) 40 MG/0.4ML PNKT Inject 40 mg into the skin every 14 (fourteen) days.     Marland Kitchen albuterol (VENTOLIN HFA) 108 (90 Base) MCG/ACT inhaler Inhale 2 puffs into the lungs every 6 (six) hours as needed. (Patient taking differently: Inhale 2 puffs into the lungs every 6 (six) hours as needed for wheezing or shortness of breath. ) 1 Inhaler 5  . ALPRAZolam (XANAX) 0.25 MG tablet Take 1 tablet (0.25 mg total) by mouth at bedtime as needed for anxiety. 30 tablet 2  . budesonide (PULMICORT) 0.25 MG/2ML nebulizer solution Take 2 mLs (0.25 mg total) by nebulization 2 (two) times daily. 150 mL 11  . BYSTOLIC 10 MG tablet TAKE  1 TABLET BY MOUTH EVERY DAY 30 tablet 2  . clotrimazole (LOTRIMIN) 1 % cream Apply 1 application topically 2 (two) times daily as needed (for irritation). 60 g 11  . dicyclomine (BENTYL) 10 MG capsule TAKE 1 CAPSULE (10 MG TOTAL) BY MOUTH 4 (FOUR) TIMES DAILY - BEFORE MEALS AND AT BEDTIME. 360 capsule 3  .  escitalopram (LEXAPRO) 10 MG tablet Take 1 tablet (10 mg total) by mouth daily. 90 tablet 3  . famotidine (PEPCID) 20 MG tablet Take 1 tablet (20 mg total) by mouth daily. Before breakfast or dinner 90 tablet 3  . fluticasone furoate-vilanterol (BREO ELLIPTA) 200-25 MCG/INH AEPB Inhale 1 puff into the lungs daily. 28 each 11  . gabapentin (NEURONTIN) 300 MG capsule Take 2-3 capsules (600-900 mg total) by mouth See admin instructions. Take 600 mg by mouth in the morning and 900 mg at bedtime 450 capsule 1  . ipratropium-albuterol (DUONEB) 0.5-2.5 (3) MG/3ML SOLN Take 3 mLs by nebulization every 8 (eight) hours as needed. 360 mL 12  . lamoTRIgine (LAMICTAL) 100 MG tablet Take 1 tablet (100 mg total) by mouth 2 (two) times daily. 180 tablet 2  . leflunomide (ARAVA) 20 MG tablet Take 1 tablet (20 mg total) by mouth daily.    Marland Kitchen lovastatin (MEVACOR) 40 MG tablet Take 1 tablet (40 mg total) by mouth at bedtime. 90 tablet 3  . Melatonin 10 MG TABS Take 10 mg by mouth at bedtime. 30 tablet 3  . mirabegron ER (MYRBETRIQ) 50 MG TB24 tablet Take 1 tablet (50 mg total) by mouth at bedtime. 30 tablet 5  . montelukast (SINGULAIR) 10 MG tablet Take 1 tablet (10 mg total) by mouth daily. 90 tablet 3  . multivitamin-lutein (OCUVITE-LUTEIN) CAPS capsule Take 1 capsule by mouth 2 (two) times daily.    . mupirocin ointment (BACTROBAN) 2 % Apply 1 application topically 2 (two) times daily. X 1 week 30 g 0  . ondansetron (ZOFRAN) 4 MG tablet Take 1 tablet (4 mg total) by mouth every 8 (eight) hours as needed for nausea or vomiting. 30 tablet 0  . pantoprazole (PROTONIX) 40 MG tablet Take 1 tablet (40 mg total) by mouth 2 (two) times daily. 30 minutes before food. Note reduction in frequency 180 tablet 1  . predniSONE (DELTASONE) 10 MG tablet 2 tablets (20MG ) for one week, then decrease to 10mg  daily until f/u. 45 tablet 0  . QUEtiapine (SEROQUEL) 25 MG tablet Take 1 tablet (25 mg total) by mouth at bedtime. 90 tablet 2  .  Spacer/Aero-Holding Chambers (AEROCHAMBER MV) inhaler Use as instructed 1 each 0  . sucralfate (CARAFATE) 1 g tablet Take 1 tablet (1 g total) by mouth 4 (four) times daily. TAKE 1 TABLET BY MOUTH 4 TIMES A DAY WITH MEALS AND AT BEDTIME 360 tablet 3  . Teriparatide, Recombinant, (FORTEO) 600 MCG/2.4ML SOLN Inject 0.08 mLs (20 mcg total) into the skin daily. 2.24 mL 0  . Tiotropium Bromide Monohydrate (SPIRIVA RESPIMAT) 2.5 MCG/ACT AERS Inhale 2 puffs into the lungs daily. (Patient taking differently: Inhale 2 puffs into the lungs at bedtime. ) 1 Inhaler 12   No current facility-administered medications for this visit.     Psychiatric Specialty Exam: Review of Systems  There were no vitals taken for this visit.There is no height or weight on file to calculate BMI.  General Appearance: unable to assess due to phone visit  Eye Contact:  Unable to assess due to phone visit  Speech:  Clear and  Coherent and Normal Rate  Volume:  Normal  Mood:  Euthymic  Affect:  Congruent  Thought Process:  Goal Directed, Linear and Descriptions of Associations: Intact  Orientation:  Full (Time, Place, and Person)  Thought Content: Logical   Suicidal Thoughts:  No  Homicidal Thoughts:  No  Memory:  Recent;   Good Remote;   Good  Judgement:  Fair  Insight:  Fair  Psychomotor Activity:  Normal  Concentration:  Concentration: Good and Attention Span: Good  Recall:  Good  Fund of Knowledge: Good  Language: Good  Akathisia:  Negative  Handed:  Right  AIMS (if indicated): not done   Assets:  Communication Skills Desire for Improvement Financial Resources/Insurance Housing Social Support  ADL's:  Intact  Cognition: WNL  Sleep:  Good   Screenings: GAD-7     Office Visit from 09/05/2015 in [place]  Total GAD-7 Score  18    Mini-Mental     Clinical Support from 03/01/2017 in [place] Office Visit from 02/28/2016 in [facility]  Office Visit from 07/11/2015 in [facility]  Total Score (max 30 points )  30  30  27     PHQ2-9     Clinical Support from 11/23/2018 in [facility] Office Visit from 07/07/2018 in [place] Office Visit from 02/24/2018 in [place] Office Visit from 10/19/2017 in [place] Office Visit from 07/19/2017 in [place]  PHQ-2 Total Score  0  0  0  0  0       Assessment and Plan: Patient reported doing well in terms of mood.  She has had a few falls in the recent past couple of months. She requested 90 day refills for all her medications.  1. Bipolar 1 disorder, mixed, full remission ([facility])  - escitalopram (LEXAPRO) 10 MG tablet; Take 1 tablet (10 mg total) by mouth daily.  Dispense: 90 tablet; Refill: 0 - lamoTRIgine (LAMICTAL) 100 MG tablet; Take 1 tablet (100 mg total) by mouth 2 (two) times daily.  Dispense: 180 tablet; Refill: 0 - Seroquel 25 mg HS   2. Anxiety state - Xanax 0.25 mg HS PRN  F/up in 3 months.  [place] Crane, MD 01/20/2019, 8:45 AM

## 2019-01-26 ENCOUNTER — Other Ambulatory Visit: Payer: Medicare Other

## 2019-01-31 ENCOUNTER — Ambulatory Visit (INDEPENDENT_AMBULATORY_CARE_PROVIDER_SITE_OTHER): Payer: Medicare Other | Admitting: Pulmonary Disease

## 2019-01-31 DIAGNOSIS — K219 Gastro-esophageal reflux disease without esophagitis: Secondary | ICD-10-CM

## 2019-01-31 DIAGNOSIS — R05 Cough: Secondary | ICD-10-CM | POA: Diagnosis not present

## 2019-01-31 DIAGNOSIS — R059 Cough, unspecified: Secondary | ICD-10-CM

## 2019-01-31 DIAGNOSIS — J849 Interstitial pulmonary disease, unspecified: Secondary | ICD-10-CM

## 2019-01-31 DIAGNOSIS — Z20828 Contact with and (suspected) exposure to other viral communicable diseases: Secondary | ICD-10-CM

## 2019-01-31 DIAGNOSIS — J449 Chronic obstructive pulmonary disease, unspecified: Secondary | ICD-10-CM

## 2019-01-31 NOTE — Progress Notes (Signed)
Subjective:    Patient ID: Kerri Carter, female    DOB: 1941-01-14, 78 y.o.   MRN: [id number]  Virtual Visit Via Video or Telephone Note:   This visit type was conducted due to national recommendations for restrictions regarding the COVID-19 pandemic .  This format is felt to be most appropriate for this patient at this time.  All issues noted in this document were discussed and addressed.  No physical exam was performed (except for noted visual exam findings with Video Visits).    I connected with Jeanella Anton by telephone at 11:50 AM and verified that I was speaking with the correct person using two identifiers. Location patient: home Location provider: [facility] Persons participating in the virtual visit: patient, physician   I discussed the limitations, risks, security and privacy concerns of performing an evaluation and management service by telephone and the availability of in person appointments. The patient expressed understanding and agreed to proceed.  HPI Ms. Hufnagle is a 78 year old former smoker with asthma/COPD overlap syndrome who has had issues with nonproductive cough since November 2019.  She has issues with gastroesophageal reflux which are now controlled with Protonix twice a day and has also has issues with mild interstitial lung disease likely related to coexisting rheumatoid arthritis.  She is on Humira.  She has been compliant with N-acetylcysteine.  She finally got her medications for nebulizer use.  She was not doing well with inhalers.  He has yet to receive the Pulmicort to use via nebulizer.  She does use DuoNeb via nebulizer and this has helped some.  Most pressing issue currently is that she has had an exposure to COVID-19.  Her husband is currently having some issues with COVID-19.  She has not gotten tested yet.  I recommended she do this.  Other than her cough which has gotten somewhat better but still not abated completely, her dyspnea is  better on DuoNeb's.  She still needs to get her Pulmicort.  She has had no fevers, chills or sweats and voices no other complaint.  Pulmonary function testing was performed on 8 December this showed that the patient has an FEV1 of 1.59 L or 76% predicted and an FEV1/FVC of 67% consistent with mild to moderate obstructive lung disease.  She had no bronchodilator response.   Review of Systems A 10 point review of systems was performed and it is as noted above otherwise negative.    Objective:   Physical Exam  Physical exam not performed as this is via telephone visit.  The patient is speaking uninterrupted sentences without difficulty.  Does not sound breathless.     Assessment & Plan:  Cough This is better with attention to her gastroesophageal reflux On PPI twice a day Aggravated by powdered inhalers Started DuoNeb with some improvement Is to get her Pulmicort 0.25 via neb twice a day  Asthma/COPD overlap syndrome Moderate defect Function decline from October 2017 Not tolerate metered-dose inhalers Switch to nebulizers Needs to get her Pulmicort as above  Interstitial lung disease, mild Likely related to rheumatoid arthritis Continue N-acetylcysteine 600 mg twice a day  Gastroesophageal reflux disease Controlled by Protonix twice a day Continue Protonix for now  Exposure to COVID-19 It is recommended the patient get tested Patient understands She is following self quarantine guidelines    Total time of visit 15 minutes Follow-up will be in 3 to 4 weeks time which will need to be via telephone if she has tested Covid positive.  Renold Don, MD [facility]   *This note was dictated using voice recognition software/Dragon.  Despite best efforts to proofread, errors can occur which can change the meaning.  Any change was purely unintentional.

## 2019-01-31 NOTE — Patient Instructions (Signed)
1.  We will do another telephone follow-up in 3 to 4 weeks time to see how your breathing medications via nebulizer are working.  2.  I do recommend you have a COVID test done since you have had exposure at home.

## 2019-02-06 ENCOUNTER — Other Ambulatory Visit: Payer: Self-pay

## 2019-02-06 ENCOUNTER — Telehealth: Payer: Self-pay | Admitting: Internal Medicine

## 2019-02-06 ENCOUNTER — Telehealth: Payer: Self-pay | Admitting: Pulmonary Disease

## 2019-02-06 ENCOUNTER — Encounter: Payer: Self-pay | Admitting: Emergency Medicine

## 2019-02-06 ENCOUNTER — Emergency Department
Admission: EM | Admit: 2019-02-06 | Discharge: 2019-02-06 | Disposition: A | Discharge status: Home / Self Care | Payer: Medicare PPO | Attending: Emergency Medicine | Admitting: Emergency Medicine

## 2019-02-06 DIAGNOSIS — I2510 Atherosclerotic heart disease of native coronary artery without angina pectoris: Secondary | ICD-10-CM | POA: Insufficient documentation

## 2019-02-06 DIAGNOSIS — Z96652 Presence of left artificial knee joint: Secondary | ICD-10-CM | POA: Diagnosis not present

## 2019-02-06 DIAGNOSIS — R197 Diarrhea, unspecified: Secondary | ICD-10-CM | POA: Diagnosis not present

## 2019-02-06 DIAGNOSIS — Z87891 Personal history of nicotine dependence: Secondary | ICD-10-CM | POA: Diagnosis not present

## 2019-02-06 DIAGNOSIS — R110 Nausea: Secondary | ICD-10-CM | POA: Diagnosis not present

## 2019-02-06 DIAGNOSIS — N183 Chronic kidney disease, stage 3 unspecified: Secondary | ICD-10-CM | POA: Insufficient documentation

## 2019-02-06 DIAGNOSIS — Z79899 Other long term (current) drug therapy: Secondary | ICD-10-CM | POA: Insufficient documentation

## 2019-02-06 DIAGNOSIS — U071 COVID-19: Secondary | ICD-10-CM | POA: Diagnosis not present

## 2019-02-06 DIAGNOSIS — J449 Chronic obstructive pulmonary disease, unspecified: Secondary | ICD-10-CM | POA: Diagnosis not present

## 2019-02-06 DIAGNOSIS — I129 Hypertensive chronic kidney disease with stage 1 through stage 4 chronic kidney disease, or unspecified chronic kidney disease: Secondary | ICD-10-CM | POA: Insufficient documentation

## 2019-02-06 LAB — CBC
HCT: 41.7 % (ref 36.0–46.0)
Hemoglobin: 13.5 g/dL (ref 12.0–15.0)
MCH: 28 pg (ref 26.0–34.0)
MCHC: 32.4 g/dL (ref 30.0–36.0)
MCV: 86.5 fL (ref 80.0–100.0)
Platelets: 268 10*3/uL (ref 150–400)
RBC: 4.82 MIL/uL (ref 3.87–5.11)
RDW: 14.5 % (ref 11.5–15.5)
WBC: 8.1 10*3/uL (ref 4.0–10.5)
nRBC: 0 % (ref 0.0–0.2)

## 2019-02-06 LAB — COMPREHENSIVE METABOLIC PANEL
ALT: 20 U/L (ref 0–44)
AST: 24 U/L (ref 15–41)
Albumin: 3.5 g/dL (ref 3.5–5.0)
Alkaline Phosphatase: 85 U/L (ref 38–126)
Anion gap: 12 (ref 5–15)
BUN: 12 mg/dL (ref 8–23)
CO2: 20 mmol/L — ABNORMAL LOW (ref 22–32)
Calcium: 8.4 mg/dL — ABNORMAL LOW (ref 8.9–10.3)
Chloride: 105 mmol/L (ref 98–111)
Creatinine, Ser: 0.97 mg/dL (ref 0.44–1.00)
GFR calc Af Amer: >60 mL/min (ref >60)
GFR calc non Af Amer: 57 mL/min — ABNORMAL LOW (ref >60)
Glucose, Bld: 124 mg/dL — ABNORMAL HIGH (ref 70–99)
Potassium: 3.8 mmol/L (ref 3.5–5.1)
Sodium: 137 mmol/L (ref 135–145)
Total Bilirubin: 0.9 mg/dL (ref 0.3–1.2)
Total Protein: 7.1 g/dL (ref 6.5–8.1)

## 2019-02-06 LAB — LIPASE, BLOOD: Lipase: 33 U/L (ref 11–51)

## 2019-02-06 MED ORDER — ONDANSETRON 4 MG PO TBDP: 4.0000 mg | ORAL_TABLET | Freq: Three times a day (TID) | ORAL | 0 refills | Status: DC | PRN

## 2019-02-06 MED ORDER — FLUTICASONE PROPIONATE 50 MCG/ACT NA SUSP: 1.0000 | Freq: Two times a day (BID) | NASAL | 0 refills | Status: DC

## 2019-02-06 MED ORDER — LOPERAMIDE HCL 2 MG PO TABS: 2.0000 mg | ORAL_TABLET | Freq: Four times a day (QID) | ORAL | 0 refills | Status: DC | PRN

## 2019-02-06 MED ORDER — SODIUM CHLORIDE 0.9% FLUSH: 3.0000 mL | Freq: Once | INTRAVENOUS | Status: DC

## 2019-02-06 NOTE — Telephone Encounter (Signed)
Pt called back about testing positive for covid and is having diarrhea for a couple of day/watery eyes/no appetite. I spoke with nurse who advised pt go to urgent care.

## 2019-02-06 NOTE — ED Notes (Signed)
Pts family to front to pick her up. PT assisted to car via wheelchair and able to stand and pivot into car without trouble.

## 2019-02-06 NOTE — Telephone Encounter (Signed)
Not respiratory symptoms needs to contact PCP.

## 2019-02-06 NOTE — ED Triage Notes (Signed)
Pt presents to ED via POV, states tested positive for Covid last Wednesday, received results today. Pt c/o diarrhea, nausea, HA,. Pt visualized in NAD in triage, playing on phone in triage. Upon arrival when asked about chief complaint pt states "I tested positive for covid" with further questions pt then explains regarding symptoms.

## 2019-02-06 NOTE — Telephone Encounter (Signed)
Pt called wanting to be seen today in office for Diarrhea. I told pt I had no available appts and Dr. Olivia Mackie is not in the office today. I  was trying to give her options then she started yelling at me and hung the phone up

## 2019-02-06 NOTE — Telephone Encounter (Addendum)
ATC pt- unable to leave vm, due to mailbox being full. Will call back.  

## 2019-02-06 NOTE — Telephone Encounter (Signed)
Called and spoke to pt, who stated she is experiencing diarrhea, headache, watery eyes and nausea. Pt stated sx have been present for 1 week. Pt had covid test at [facility], however she can not remember when test was performed. Pt has not received results from [facility] as of yet. I have recommended that pt contact [facility] to obtain results.  Pt denied sob, fever, chills or sweats.   Will route to LG to make aware.

## 2019-02-06 NOTE — ED Provider Notes (Signed)
[facility] Emergency Department Provider Note  ____________________________________________  Time seen: Approximately 6:33 PM  I have reviewed the triage vital signs and the nursing notes.   HISTORY  Chief Complaint Diarrhea, Nausea, and Headache    HPI Kerri Carter is a 79 y.o. female who presents the emergency department for evaluation of headache, nausea, diarrhea in the setting of a positive Covid test.  Patient has been is admitted in [place] for COVID-19.  Patient tested positive for Covid as well.  Patient had test several days ago, received results today.  Patient presents to the emergency department for evaluation "because of all my other medical issues, 1 to make sure I am okay."  Patient does have a history of chronic kidney disease, right nephrectomy, COPD, GERD, sleep apnea, spinal stenosis, valvular heart disease, hypertension, GERD.  Patient does not have any other complaints other than headache, nausea and diarrhea.  No respiratory complaints of shortness of breath, cough.  No fevers or chills.  No medications prior to arrival.         Past Medical History:  Diagnosis Date  . Anxiety   . Asthma   . Bipolar disorder ([place])   . Cataract    s/p b/l repair   . Chicken pox   . CKD (chronic kidney disease)   . CKD (chronic kidney disease), stage III    a. s/p R nephrectomy.  . Conversion disorder   . COPD (chronic obstructive pulmonary disease) ([place])   . Depression   . Essential hypertension   . GERD (gastroesophageal reflux disease)   . Hyperlipidemia   . Inflammatory arthritis    a. hands/carpal tunnel.  b. Low titer rheumatoid factor. c. Negative anti-CCP antibodies. d. Plaquenil.  . Non-Obstructive CAD    a. 07/2009 Cath ([facility]): nonobs dzs;  b. 03/2011 Cath [facility]): nonobs dzs.  . Osteoarthritis    a. Knees.  . PUD (peptic ulcer disease)   . S/P right hip fracture    11/01/16 s/p repair  . Shoulder pain   . Sleep apnea    no cpap   . Spinal stenosis at L4-L5 level    severe with L4/L5 anterolisthesis grade 1 anterolisthesis   . Toxic maculopathy   . Valvular heart disease    a. 07/2015 Echo: EF 55-60%, Mild AI, AS, MR, and TR.    Patient Active Problem List   Diagnosis Date Noted  . Anxiety state 01/20/2019  . Pain due to onychomycosis of toenails of both feet 12/05/2018  . Hav (hallux abducto valgus), unspecified laterality 12/05/2018  . Status post reverse arthroplasty of shoulder, left 07/26/2018  . Interstitial pulmonary disease ([place]) 07/07/2018  . Fatigue 07/07/2018  . Avascular necrosis of left shoulder due to adverse effect of steroid therapy ([place]) 01/20/2018  . Status post reverse total shoulder replacement, right 11/04/2017  . Leukocytosis 10/19/2017  . Allergic rhinitis 09/28/2017  . Rotator cuff tendinitis, right 07/26/2017  . Strain of right hip 02/26/2017  . History of fracture of left hip 01/15/2017  . Status post lumbar spinal fusion 01/15/2017  . Dislocation of hip joint prosthesis ([place]) 01/08/2017  . Status post hip hemiarthroplasty 01/08/2017  . Leg swelling 12/15/2016  . Hip fracture ([place]) 10/31/2016  . Polyneuropathy 10/28/2016  . Left foot pain 10/19/2016  . Spinal stenosis of lumbar region 09/15/2016  . Osteoporosis 08/27/2016  . Prediabetes 08/25/2016  . Mixed bipolar I disorder ([place]) 10/09/2015  . CKD (chronic kidney disease), stage III   . Essential  hypertension   . Conversion disorder 08/04/2015  . Hyperlipidemia 07/17/2015  . 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 Macular degeneration 07/17/2015  . Insomnia 07/17/2015  . Rheumatoid arthritis ([place]) 12/05/2014  . Anxiety and depression 06/13/2013  . Cough 05/02/2012  . Valvular heart disease 10/13/2011  . Asthma-COPD overlap syndrome ([place]) 09/09/2011  . OSA (obstructive sleep apnea) 09/09/2011  . Nocturnal hypoxemia 09/09/2011  . Gastroesophageal reflux disease 02/24/2011  . Single kidney  02/24/2011    Past Surgical History:  Procedure Laterality Date  . APPENDECTOMY    . BACK SURGERY    . BUNIONECTOMY Right   . CATARACT EXTRACTION, BILATERAL    . CESAREAN SECTION     x1  . CHOLECYSTECTOMY N/A 05/11/2016   Procedure: LAPAROSCOPIC CHOLECYSTECTOMY;  Surgeon: Florene Glen, MD;  Location: [facility] ORS;  Service: General;  Laterality: N/A;  . COLONOSCOPY WITH PROPOFOL N/A 04/02/2016   Procedure: COLONOSCOPY WITH PROPOFOL;  Surgeon: Jonathon Bellows, MD;  Location: [facility] ENDOSCOPY;  Service: Endoscopy;  Laterality: N/A;  . ENDOSCOPIC RETROGRADE CHOLANGIOPANCREATOGRAPHY (ERCP) WITH PROPOFOL N/A 05/08/2016   Procedure: ENDOSCOPIC RETROGRADE CHOLANGIOPANCREATOGRAPHY (ERCP) WITH PROPOFOL;  Surgeon: Lucilla Lame, MD;  Location: [facility] ENDOSCOPY;  Service: Endoscopy;  Laterality: N/A;  . ERCP     with biliary spincterotomy 05/08/16 Dr. Allen Norris for choledocholithiasis   . ESOPHAGEAL DILATION  04/02/2016   Procedure: ESOPHAGEAL DILATION;  Surgeon: Jonathon Bellows, MD;  Location: [facility] ENDOSCOPY;  Service: Endoscopy;;  . ESOPHAGOGASTRODUODENOSCOPY (EGD) WITH PROPOFOL N/A 04/02/2016   Procedure: ESOPHAGOGASTRODUODENOSCOPY (EGD) WITH PROPOFOL;  Surgeon: Jonathon Bellows, MD;  Location: [facility] ENDOSCOPY;  Service: Endoscopy;  Laterality: N/A;  . HIP ARTHROPLASTY Right 11/01/2016   Procedure: ARTHROPLASTY BIPOLAR HIP (HEMIARTHROPLASTY);  Surgeon: Corky Mull, MD;  Location: [facility] ORS;  Service: Orthopedics;  Laterality: Right;  . 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 osteoporosis     noted DEXA 08/19/16   . REPLACEMENT TOTAL KNEE Right   . REVERSE SHOULDER ARTHROPLASTY Right 11/04/2017   Procedure: REVERSE SHOULDER ARTHROPLASTY;  Surgeon: Corky Mull, MD;  Location: [facility] ORS;  Service: Orthopedics;  Laterality: Right;  . REVERSE SHOULDER ARTHROPLASTY Left 07/26/2018   Procedure: REVERSE SHOULDER ARTHROPLASTY;  Surgeon: Corky Mull, MD;  Location: [facility] ORS;  Service:  Orthopedics;  Laterality: Left;  . TONSILLECTOMY    . TOTAL HIP ARTHROPLASTY  12/10/11   [facility] left hip  . TOTAL HIP ARTHROPLASTY Bilateral   . TUBAL LIGATION      Prior to Admission medications   Medication Sig Start Date End Date Taking? Authorizing Provider  Acetylcysteine 600 MG CAPS Take 1 capsule (600 mg total) by mouth 2 (two) times daily with a meal. 11/02/18   [name], MD  Adalimumab (HUMIRA PEN) 40 MG/0.4ML PNKT Inject 40 mg into the skin every 14 (fourteen) days.     [provider]  albuterol (VENTOLIN HFA) 108 (90 Base) MCG/ACT inhaler Inhale 2 puffs into the lungs every 6 (six) hours as needed. Patient taking differently: Inhale 2 puffs into the lungs every 6 (six) hours as needed for wheezing or shortness of breath.  11/25/15   Juanito Doom, MD  ALPRAZolam Duanne Moron) 0.25 MG tablet Take 1 tablet (0.25 mg total) by mouth at bedtime as needed for anxiety. 01/20/19 04/20/19  Nevada Crane, MD  budesonide (PULMICORT) 0.25 MG/2ML nebulizer solution Take 2 mLs (0.25 mg total) by nebulization 2 (two) times daily. 01/11/19  01/11/20  [name], MD  BYSTOLIC 10 MG tablet TAKE 1 TABLET BY MOUTH EVERY DAY 09/26/18   Juanito Doom, MD  clotrimazole (LOTRIMIN) 1 % cream Apply 1 application topically 2 (two) times daily as needed (for irritation). 12/22/17   McLean-Scocuzza, Nino Glow, MD  dicyclomine (BENTYL) 10 MG capsule TAKE 1 CAPSULE (10 MG TOTAL) BY MOUTH 4 (FOUR) TIMES DAILY - BEFORE MEALS AND AT BEDTIME. 01/17/19 01/12/20  Jonathon Bellows, MD  escitalopram (LEXAPRO) 10 MG tablet Take 1 tablet (10 mg total) by mouth daily. 01/20/19   Nevada Crane, MD  famotidine (PEPCID) 20 MG tablet Take 1 tablet (20 mg total) by mouth daily. Before breakfast or dinner 01/19/19   McLean-Scocuzza, Nino Glow, MD  fluticasone (FLONASE) 50 MCG/ACT nasal spray Place 1 spray into both nostrils 2 (two) times daily. 02/06/19   Parvin Stetzer, Charline Bills, PA-C  fluticasone furoate-vilanterol  (BREO ELLIPTA) 200-25 MCG/INH AEPB Inhale 1 puff into the lungs daily. 10/31/18   [name], MD  gabapentin (NEURONTIN) 300 MG capsule Take 2-3 capsules (600-900 mg total) by mouth See admin instructions. Take 600 mg by mouth in the morning and 900 mg at bedtime 08/01/18   McLean-Scocuzza, Nino Glow, MD  ipratropium-albuterol (DUONEB) 0.5-2.5 (3) MG/3ML SOLN Take 3 mLs by nebulization every 8 (eight) hours as needed. 03/25/18   McLean-Scocuzza, Nino Glow, MD  lamoTRIgine (LAMICTAL) 100 MG tablet Take 1 tablet (100 mg total) by mouth 2 (two) times daily. 01/20/19   [place] Crane, MD  leflunomide (ARAVA) 20 MG tablet Take 1 tablet (20 mg total) by mouth daily. 09/30/17   McLean-Scocuzza, Nino Glow, MD  loperamide (IMODIUM A-D) 2 MG tablet Take 1 tablet (2 mg total) by mouth 4 (four) times daily as needed for diarrhea or loose stools. 02/06/19   Diasia Henken, Charline Bills, PA-C  lovastatin (MEVACOR) 40 MG tablet Take 1 tablet (40 mg total) by mouth at bedtime. 01/21/18   McLean-Scocuzza, Nino Glow, MD  mirabegron ER (MYRBETRIQ) 50 MG TB24 tablet Take 1 tablet (50 mg total) by mouth at bedtime. 09/19/18   Zara Council A, PA-C  montelukast (SINGULAIR) 10 MG tablet Take 1 tablet (10 mg total) by mouth daily. 06/14/18   McLean-Scocuzza, Nino Glow, MD  multivitamin-lutein [facility]) CAPS capsule Take 1 capsule by mouth 2 (two) times daily.    [provider]  mupirocin ointment (BACTROBAN) 2 % Apply 1 application topically 2 (two) times daily. X 1 week 10/19/17   McLean-Scocuzza, Nino Glow, MD  ondansetron (ZOFRAN) 4 MG tablet Take 1 tablet (4 mg total) by mouth every 8 (eight) hours as needed for nausea or vomiting. 03/10/17   McLean-Scocuzza, Nino Glow, MD  ondansetron (ZOFRAN-ODT) 4 MG disintegrating tablet Take 1 tablet (4 mg total) by mouth every 8 (eight) hours as needed for nausea or vomiting. 02/06/19   Doloris Servantes, Charline Bills, PA-C  pantoprazole (PROTONIX) 40 MG tablet Take 1 tablet (40 mg total) by mouth 2  (two) times daily. 30 minutes before food. Note reduction in frequency 10/31/18   [name], MD  predniSONE (DELTASONE) 10 MG tablet 2 tablets (20MG ) for one week, then decrease to 10mg  daily until f/u. 01/09/19   [name], MD  QUEtiapine (SEROQUEL) 25 MG tablet Take 1 tablet (25 mg total) by mouth at bedtime. 01/20/19   [place] Crane, MD  Spacer/Aero-Holding Chambers (AEROCHAMBER MV) inhaler Use as instructed 10/31/18   [name], MD  sucralfate (CARAFATE) 1 g tablet Take 1 tablet (1  g total) by mouth 4 (four) times daily. TAKE 1 TABLET BY MOUTH 4 TIMES A DAY WITH MEALS AND AT BEDTIME 10/27/18 10/22/19  Jonathon Bellows, MD  Teriparatide, Recombinant, (FORTEO) 600 MCG/2.4ML SOLN Inject 0.08 mLs (20 mcg total) into the skin daily. 11/03/16   Max Sane, MD  Tiotropium Bromide Monohydrate (SPIRIVA RESPIMAT) 2.5 MCG/ACT AERS Inhale 2 puffs into the lungs daily. Patient taking differently: Inhale 2 puffs into the lungs at bedtime.  02/24/18   McLean-Scocuzza, Nino Glow, MD    Allergies Ceftin [cefuroxime axetil]; Lisinopril; Morphine; Sulfasalazine; Aspirin; Antihistamines, chlorpheniramine-type; Antivert [meclizine hcl]; Decongestant [pseudoephedrine hcl]; Doxycycline; Polymyxin b; Sulfa antibiotics; Xarelto [rivaroxaban]; Adhesive [tape]; Iodine; Levaquin [levofloxacin in d5w]; and Tetanus toxoids  Family History  Problem Relation Age of Onset  . Rheum arthritis Mother   . Asthma Mother   . Parkinson's disease Mother   . Heart disease Mother   . Stroke Mother   . Hypertension Mother   . Heart attack Father   . Heart disease Father   . Hypertension Father   . Diabetes Son   . Gout Son   . Asthma Sister   . Heart disease Sister   . Lung cancer Sister   . Heart disease Sister   . Heart disease Sister   . Breast cancer Sister   . Heart attack Sister   . Heart disease Brother   . Heart disease Maternal Grandmother   . Diabetes Maternal Grandmother   . Colon cancer  Maternal Grandmother   . Cancer Maternal Grandmother        Hodgkins lymphoma  . Heart disease Brother   . Alcohol abuse Brother   . Depression Brother   . Dementia Son     Social History Social History   Tobacco Use  . Smoking status: Former Smoker    Packs/day: 0.50    Years: 20.00    Pack years: 10.00    Types: Cigarettes    Quit date: 02/02/1974    Years since quitting: 45.0  . Smokeless tobacco: Never Used  Substance Use Topics  . Alcohol use: No  . Drug use: No     Review of Systems  Constitutional: No fever/chills Eyes: No visual changes. No discharge ENT: No upper respiratory complaints. Cardiovascular: no chest pain. Respiratory: no cough. No SOB. Gastrointestinal: No abdominal pain.  Positive nausea, no vomiting.  Positive diarrhea.  No constipation. Genitourinary: Negative for dysuria. No hematuria Musculoskeletal: Negative for musculoskeletal pain. Skin: Negative for rash, abrasions, lacerations, ecchymosis. Neurological: Positive for headaches, denies focal weakness or numbness. 10-point ROS otherwise negative.  ____________________________________________   PHYSICAL EXAM:  VITAL SIGNS: ED Triage Vitals  Enc Vitals Group     BP 02/06/19 1553 (!) 158/99     Pulse Rate 02/06/19 1553 87     Resp 02/06/19 1553 18     Temp 02/06/19 1553 98.3 F (36.8 C)     Temp Source 02/06/19 1553 Oral     SpO2 02/06/19 1553 96 %     Weight 02/06/19 1554 170 lb (77.1 kg)     Height 02/06/19 1554 5\' 1"  (1.549 m)     Head Circumference --      Peak Flow --      Pain Score 02/06/19 1554 5     Pain Loc --      Pain Edu? --      Excl. in [place]? --      Constitutional: Alert and oriented. Well appearing and in no acute  distress. Eyes: Conjunctivae are normal. PERRL. EOMI. Head: Atraumatic. ENT:      Ears:       Nose: No congestion/rhinnorhea.      Mouth/Throat: Mucous membranes are moist.  Neck: No stridor.  Neck is supple full range of  motion Hematological/Lymphatic/Immunilogical: No cervical lymphadenopathy Cardiovascular: Normal rate, regular rhythm. Normal S1 and S2.  Good peripheral circulation. Respiratory: Normal respiratory effort without tachypnea or retractions. Lungs CTAB. Good air entry to the bases with no decreased or absent breath sounds. Gastrointestinal: Bowel sounds 4 quadrants. Soft and nontender to palpation. No guarding or rigidity. No palpable masses. No distention. Musculoskeletal: Full range of motion to all extremities. No gross deformities appreciated. Neurologic:  Normal speech and language. No gross focal neurologic deficits are appreciated.  Skin:  Skin is warm, dry and intact. No rash noted. Psychiatric: Mood and affect are normal. Speech and behavior are normal. Patient exhibits appropriate insight and judgement.   ____________________________________________   LABS (all labs ordered are listed, but only abnormal results are displayed)  Labs Reviewed  COMPREHENSIVE METABOLIC PANEL - Abnormal; Notable for the following components:      Result Value   CO2 20 (*)    Glucose, Bld 124 (*)    Calcium 8.4 (*)    GFR calc non Af Amer 57 (*)    All other components within normal limits  LIPASE, BLOOD  CBC  URINALYSIS, COMPLETE (UACMP) WITH MICROSCOPIC   ____________________________________________  EKG   ____________________________________________  RADIOLOGY   No results found.  ____________________________________________    PROCEDURES  Procedure(s) performed:    Procedures    Medications  sodium chloride flush (NS) 0.9 % injection 3 mL (3 mLs Intravenous Not Given 02/06/19 1736)     ____________________________________________   INITIAL IMPRESSION / ASSESSMENT AND PLAN / ED COURSE  Pertinent labs & imaging results that were available during my care of the patient were reviewed by me and considered in my medical decision making (see chart for details).  Review  of the [place] CSRS was performed in accordance of the [place] prior to dispensing any controlled drugs.           Patient's diagnosis is consistent with COVID-19, nausea, diarrhea.  Patient presented to the emergency department with complaint of headache, nausea, diarrhea in the setting of a positive Covid test.  Patient does have a significant medical history but at this time exam, labs are reassuring.  Patient's vital signs been stable.  No indication for admission at this time.  Patient will be prescribed symptom control medications for nasal congestion, nausea, diarrhea.  Patient is to continue to self quarantine at home.  Return precautions are discussed at length with the patient.  Patient verbalizes understanding of same.  Follow-up primary care or return to the emergency department as needed..  Patient is given ED precautions to return to the ED for any worsening or new symptoms.     ____________________________________________  FINAL CLINICAL IMPRESSION(S) / ED DIAGNOSES  Final diagnoses:  COVID-19  Nausea  Diarrhea, unspecified type      NEW MEDICATIONS STARTED DURING THIS VISIT:  ED Discharge Orders         Ordered    ondansetron (ZOFRAN-ODT) 4 MG disintegrating tablet  Every 8 hours PRN     02/06/19 1910    fluticasone (FLONASE) 50 MCG/ACT nasal spray  2 times daily     02/06/19 1910    loperamide (IMODIUM A-D) 2 MG tablet  4 times daily PRN  02/06/19 1910              This chart was dictated using voice recognition software/Dragon. Despite best efforts to proofread, errors can occur which can change the meaning. Any change was purely unintentional.    Darletta Moll, PA-C 02/06/19 1910    Delman Kitten, MD 02/06/19 1931

## 2019-02-06 NOTE — Telephone Encounter (Signed)
Spoke to pt and relayed below recommendations.  Pt voiced her understanding and had no further questions.  Nothing further is needed.  

## 2019-02-07 ENCOUNTER — Other Ambulatory Visit: Payer: Self-pay | Admitting: Pulmonary Disease

## 2019-02-07 NOTE — Telephone Encounter (Signed)
Can try probiotics  Avoid dairy  Bland diet brat, bananas, applesauce, crackers, toast, rice  Low sugar gatorade  If needed schedule virtual appt   [place]

## 2019-02-07 NOTE — Telephone Encounter (Signed)
Patient went to ed yesterday.

## 2019-02-08 DIAGNOSIS — S42121K Displaced fracture of acromial process, right shoulder, subsequent encounter for fracture with nonunion: Secondary | ICD-10-CM | POA: Diagnosis not present

## 2019-02-08 NOTE — Telephone Encounter (Signed)
Called Pt and she stated she went to the Emergency on 02/06/2019 and was Dx with + Covid. Pt stated she was given medication for diarrhea, nausea and Flonase for nasal congestion. Pt stated she will be calling back to schedule a Virtual visit.

## 2019-02-10 DIAGNOSIS — T50905A Adverse effect of unspecified drugs, medicaments and biological substances, initial encounter: Secondary | ICD-10-CM | POA: Diagnosis not present

## 2019-02-10 DIAGNOSIS — M818 Other osteoporosis without current pathological fracture: Secondary | ICD-10-CM | POA: Diagnosis not present

## 2019-02-13 ENCOUNTER — Other Ambulatory Visit: Payer: Self-pay

## 2019-02-13 ENCOUNTER — Ambulatory Visit (INDEPENDENT_AMBULATORY_CARE_PROVIDER_SITE_OTHER): Payer: Medicare PPO | Admitting: Gastroenterology

## 2019-02-13 DIAGNOSIS — K589 Irritable bowel syndrome without diarrhea: Secondary | ICD-10-CM

## 2019-02-13 NOTE — Progress Notes (Signed)
Kerri Carter , MD [address]  [place]  [place], [place] [address]  Main: [phone]  Fax: [phone]   Primary Care Physician: McLean-Scocuzza, Nino Glow, MD  Virtual Visit via Telephone Note  I connected with patient on 02/13/19 at  2:30 PM EST by telephone and verified that I am speaking with the correct person using two identifiers.   I discussed the limitations, risks, security and privacy concerns of performing an evaluation and management service by telephone and the availability of in person appointments. I also discussed with the patient that there may be a patient responsible charge related to this service. The patient expressed understanding and agreed to proceed.  Location of Patient: Home Location of Provider: Home Persons involved: Patient and provider only   History of Present Illness: No chief complaint on file.   HPI: Kerri Carter is a 78 y.o. female   Summary of history :  Was initially referred and seen by myself on 10/24/2018.She has previously seen Dr Tiffany Kocher at [facility] ,h/o dysphagia, diarrhea, iron deficiency anemia due to small bowel AVM's .In 2018 she was seen by myself for abdominal pain and anemia. A CT scan of the abdomen at that point of time showed an adnexal lesion.LFT's in 02/2014 showed an elevated alkaline phosphatase , normal lipase and Hb 11 grams . Iron studies normal .EGD/colonoscopy 04/2016,EGD - mild chronic duodenitis on bx , normal villi. Gastric biopsies showed erosive gastritis , normal esophageal biopsies. Random colon biopsies showed mild active colitis., 3 tubular adenomas were excised. Celiac serology was negative , TSH normal  07/27/2018: Hemoglobin 11.3 g. 09/30/2018 hemoglobin 12.4 g.   On 10/24/2018 she states that she had abdominal pain for 6 months which is cramping in nature, nonradiating, on and off, preceded by bowel movement and relieved subsequently.  Occasionally also has pain after bowel movement.  Stool is  the consistency of pudding.  Has a bowel movement daily.    Had significant improvement with Bentyl and fiber pills   Interval history 12/06/2018-02/13/2019 Presented to the emergency room on 02/06/2019 after testing positive for Covid at [facility].  At that time she had some headache nausea and diarrhea.  Commenced on loperamide and discharge.   She is doing well no complaints.  She says that her symptoms are under control.  She decided that she will call me if the symptoms are no better to obtain a CT scan of the abdomen.      Current Outpatient Medications  Medication Sig Dispense Refill  . Acetylcysteine 600 MG CAPS Take 1 capsule (600 mg total) by mouth 2 (two) times daily with a meal. 60 capsule 2  . Adalimumab (HUMIRA PEN) 40 MG/0.4ML PNKT Inject 40 mg into the skin every 14 (fourteen) days.     Marland Kitchen albuterol (VENTOLIN HFA) 108 (90 Base) MCG/ACT inhaler Inhale 2 puffs into the lungs every 6 (six) hours as needed. (Patient taking differently: Inhale 2 puffs into the lungs every 6 (six) hours as needed for wheezing or shortness of breath. ) 1 Inhaler 5  . ALPRAZolam (XANAX) 0.25 MG tablet Take 1 tablet (0.25 mg total) by mouth at bedtime as needed for anxiety. 90 tablet 0  . budesonide (PULMICORT) 0.25 MG/2ML nebulizer solution Take 2 mLs (0.25 mg total) by nebulization 2 (two) times daily. 150 mL 11  . BYSTOLIC 10 MG tablet TAKE 1 TABLET BY MOUTH EVERY DAY 30 tablet 2  . clotrimazole (LOTRIMIN) 1 % cream Apply 1 application topically  2 (two) times daily as needed (for irritation). 60 g 11  . dicyclomine (BENTYL) 10 MG capsule TAKE 1 CAPSULE (10 MG TOTAL) BY MOUTH 4 (FOUR) TIMES DAILY - BEFORE MEALS AND AT BEDTIME. 360 capsule 3  . escitalopram (LEXAPRO) 10 MG tablet Take 1 tablet (10 mg total) by mouth daily. 90 tablet 0  . famotidine (PEPCID) 20 MG tablet Take 1 tablet (20 mg total) by mouth daily. Before breakfast or dinner 90 tablet 3  . fluticasone (FLONASE) 50 MCG/ACT nasal spray  Place 1 spray into both nostrils 2 (two) times daily. 16 g 0  . fluticasone furoate-vilanterol (BREO ELLIPTA) 200-25 MCG/INH AEPB Inhale 1 puff into the lungs daily. 28 each 11  . gabapentin (NEURONTIN) 300 MG capsule Take 2-3 capsules (600-900 mg total) by mouth See admin instructions. Take 600 mg by mouth in the morning and 900 mg at bedtime 450 capsule 1  . ipratropium-albuterol (DUONEB) 0.5-2.5 (3) MG/3ML SOLN Take 3 mLs by nebulization every 8 (eight) hours as needed. 360 mL 12  . lamoTRIgine (LAMICTAL) 100 MG tablet Take 1 tablet (100 mg total) by mouth 2 (two) times daily. 180 tablet 0  . leflunomide (ARAVA) 20 MG tablet Take 1 tablet (20 mg total) by mouth daily.    Marland Kitchen loperamide (IMODIUM A-D) 2 MG tablet Take 1 tablet (2 mg total) by mouth 4 (four) times daily as needed for diarrhea or loose stools. 30 tablet 0  . lovastatin (MEVACOR) 40 MG tablet Take 1 tablet (40 mg total) by mouth at bedtime. 90 tablet 3  . mirabegron ER (MYRBETRIQ) 50 MG TB24 tablet Take 1 tablet (50 mg total) by mouth at bedtime. 30 tablet 5  . montelukast (SINGULAIR) 10 MG tablet Take 1 tablet (10 mg total) by mouth daily. 90 tablet 3  . multivitamin-lutein (OCUVITE-LUTEIN) CAPS capsule Take 1 capsule by mouth 2 (two) times daily.    . mupirocin ointment (BACTROBAN) 2 % Apply 1 application topically 2 (two) times daily. X 1 week 30 g 0  . ondansetron (ZOFRAN) 4 MG tablet Take 1 tablet (4 mg total) by mouth every 8 (eight) hours as needed for nausea or vomiting. 30 tablet 0  . ondansetron (ZOFRAN-ODT) 4 MG disintegrating tablet Take 1 tablet (4 mg total) by mouth every 8 (eight) hours as needed for nausea or vomiting. 20 tablet 0  . pantoprazole (PROTONIX) 40 MG tablet Take 1 tablet (40 mg total) by mouth 2 (two) times daily. 30 minutes before food. Note reduction in frequency 180 tablet 1  . predniSONE (DELTASONE) 10 MG tablet 2 tablets (20MG ) for one week, then decrease to 10mg  daily until f/u. 45 tablet 0  .  QUEtiapine (SEROQUEL) 25 MG tablet Take 1 tablet (25 mg total) by mouth at bedtime. 90 tablet 0  . Spacer/Aero-Holding Chambers (AEROCHAMBER MV) inhaler Use as instructed 1 each 0  . sucralfate (CARAFATE) 1 g tablet Take 1 tablet (1 g total) by mouth 4 (four) times daily. TAKE 1 TABLET BY MOUTH 4 TIMES A DAY WITH MEALS AND AT BEDTIME 360 tablet 3  . Teriparatide, Recombinant, (FORTEO) 600 MCG/2.4ML SOLN Inject 0.08 mLs (20 mcg total) into the skin daily. 2.24 mL 0  . Tiotropium Bromide Monohydrate (SPIRIVA RESPIMAT) 2.5 MCG/ACT AERS Inhale 2 puffs into the lungs daily. (Patient taking differently: Inhale 2 puffs into the lungs at bedtime. ) 1 Inhaler 12   No current facility-administered medications for this visit.    Allergies as of 02/13/2019 - Review Complete 02/06/2019  Allergen Reaction Noted  . Ceftin [cefuroxime axetil] Anaphylaxis 09/08/2011  . Lisinopril Anaphylaxis 09/08/2011  . Morphine Other (See Comments) 02/25/2012  . Sulfasalazine Anaphylaxis 03/08/2014  . Aspirin Other (See Comments) 09/15/2016  . Antihistamines, chlorpheniramine-type Other (See Comments) 09/08/2011  . Antivert [meclizine hcl] Other (See Comments) 09/08/2011  . Decongestant [pseudoephedrine hcl] Other (See Comments) 09/08/2011  . Doxycycline Other (See Comments) 09/08/2011  . Polymyxin b Other (See Comments) 02/28/2016  . Sulfa antibiotics Other (See Comments) 04/25/2014  . Xarelto [rivaroxaban] Other (See Comments) 01/19/2012  . Adhesive [tape] Rash 09/08/2011  . Iodine Hives and Rash 09/08/2011  . Levaquin [levofloxacin in d5w] Rash 09/08/2011  . Tetanus toxoids Rash and Other (See Comments) 09/08/2011    Review of Systems:    All systems reviewed and negative except where noted in HPI.   Observations/Objective:  Labs: CMP     Component Value Date/Time   NA 137 02/06/2019 1603   NA 140 11/24/2013 1622   K 3.8 02/06/2019 1603   K 3.7 11/24/2013 1622   CL 105 02/06/2019 1603   CL 105  11/24/2013 1622   CO2 20 (L) 02/06/2019 1603   CO2 31 11/24/2013 1622   GLUCOSE 124 (H) 02/06/2019 1603   GLUCOSE 115 (H) 11/24/2013 1622   BUN 12 02/06/2019 1603   BUN 13 11/24/2013 1622   CREATININE 0.97 02/06/2019 1603   CREATININE 1.27 (H) 08/06/2017 0910   CALCIUM 8.4 (L) 02/06/2019 1603   CALCIUM 9.0 11/24/2013 1622   PROT 7.1 02/06/2019 1603   PROT 7.1 11/24/2013 1622   ALBUMIN 3.5 02/06/2019 1603   ALBUMIN 3.9 11/24/2013 1622   AST 24 02/06/2019 1603   AST 19 11/24/2013 1622   ALT 20 02/06/2019 1603   ALT 17 11/24/2013 1622   ALKPHOS 85 02/06/2019 1603   ALKPHOS 87 11/24/2013 1622   BILITOT 0.9 02/06/2019 1603   BILITOT 0.3 11/24/2013 1622   GFRNONAA 57 (L) 02/06/2019 1603   GFRNONAA 44 (L) 11/24/2013 1622   GFRNONAA 58 (L) 07/22/2013 0549   GFRAA >60 02/06/2019 1603   GFRAA 54 (L) 11/24/2013 1622   GFRAA >60 07/22/2013 0549   Lab Results  Component Value Date   WBC 8.1 02/06/2019   HGB 13.5 02/06/2019   HCT 41.7 02/06/2019   MCV 86.5 02/06/2019   PLT 268 02/06/2019    Imaging Studies: No results found.  Assessment and Plan:   Kerri Carter is a 79 y.o. y/o femalehere to follow-up for abdominal pain felt to be of irritable bowel syndrome in nature.   She had improved with Bentyl and the plan was to reevaluate her today if the pain is not getting better to obtain a CT scan.  She states that feeling much better and if the pain were to recur or to cause her significant discomfort she will call me back to schedule a CT scan of the abdomen.   I discussed the assessment and treatment plan with the patient. The patient was provided an opportunity to ask questions and all were answered. The patient agreed with the plan and demonstrated an understanding of the instructions.   The patient was advised to call back or seek an in-person evaluation if the symptoms worsen or if the condition fails to improve as anticipated.  I provided 8 minutes of non-face-to-face time  during this encounter.  Dr Kerri Bellows MD,MRCP [facility]) Gastroenterology/Hepatology Pager: [phone]   Speech recognition software was used to dictate this note.

## 2019-02-21 ENCOUNTER — Emergency Department
Admission: EM | Admit: 2019-02-21 | Discharge: 2019-02-21 | Disposition: A | Discharge status: Home / Self Care | Payer: Medicare PPO | Attending: Student | Admitting: Student

## 2019-02-21 ENCOUNTER — Other Ambulatory Visit: Payer: Self-pay

## 2019-02-21 ENCOUNTER — Emergency Department: Payer: Medicare PPO

## 2019-02-21 ENCOUNTER — Encounter: Payer: Self-pay | Admitting: Emergency Medicine

## 2019-02-21 DIAGNOSIS — M25551 Pain in right hip: Secondary | ICD-10-CM | POA: Diagnosis not present

## 2019-02-21 DIAGNOSIS — Y939 Activity, unspecified: Secondary | ICD-10-CM | POA: Diagnosis not present

## 2019-02-21 DIAGNOSIS — Z87891 Personal history of nicotine dependence: Secondary | ICD-10-CM | POA: Diagnosis not present

## 2019-02-21 DIAGNOSIS — Y999 Unspecified external cause status: Secondary | ICD-10-CM | POA: Insufficient documentation

## 2019-02-21 DIAGNOSIS — Z79899 Other long term (current) drug therapy: Secondary | ICD-10-CM | POA: Insufficient documentation

## 2019-02-21 DIAGNOSIS — S32040A Wedge compression fracture of fourth lumbar vertebra, initial encounter for closed fracture: Secondary | ICD-10-CM

## 2019-02-21 DIAGNOSIS — J449 Chronic obstructive pulmonary disease, unspecified: Secondary | ICD-10-CM | POA: Diagnosis not present

## 2019-02-21 DIAGNOSIS — Z96651 Presence of right artificial knee joint: Secondary | ICD-10-CM | POA: Diagnosis not present

## 2019-02-21 DIAGNOSIS — I2510 Atherosclerotic heart disease of native coronary artery without angina pectoris: Secondary | ICD-10-CM | POA: Diagnosis not present

## 2019-02-21 DIAGNOSIS — S79921A Unspecified injury of right thigh, initial encounter: Secondary | ICD-10-CM | POA: Diagnosis not present

## 2019-02-21 DIAGNOSIS — Y929 Unspecified place or not applicable: Secondary | ICD-10-CM | POA: Diagnosis not present

## 2019-02-21 DIAGNOSIS — N183 Chronic kidney disease, stage 3 unspecified: Secondary | ICD-10-CM | POA: Diagnosis not present

## 2019-02-21 DIAGNOSIS — R102 Pelvic and perineal pain: Secondary | ICD-10-CM | POA: Diagnosis not present

## 2019-02-21 DIAGNOSIS — W1843XA Slipping, tripping and stumbling without falling due to stepping from one level to another, initial encounter: Secondary | ICD-10-CM | POA: Diagnosis not present

## 2019-02-21 DIAGNOSIS — M545 Low back pain: Secondary | ICD-10-CM | POA: Diagnosis not present

## 2019-02-21 DIAGNOSIS — J45909 Unspecified asthma, uncomplicated: Secondary | ICD-10-CM | POA: Diagnosis not present

## 2019-02-21 DIAGNOSIS — M4856XA Collapsed vertebra, not elsewhere classified, lumbar region, initial encounter for fracture: Secondary | ICD-10-CM | POA: Insufficient documentation

## 2019-02-21 DIAGNOSIS — I1310 Hypertensive heart and chronic kidney disease without heart failure, with stage 1 through stage 4 chronic kidney disease, or unspecified chronic kidney disease: Secondary | ICD-10-CM | POA: Diagnosis not present

## 2019-02-21 DIAGNOSIS — S3992XA Unspecified injury of lower back, initial encounter: Secondary | ICD-10-CM | POA: Diagnosis not present

## 2019-02-21 DIAGNOSIS — S79911A Unspecified injury of right hip, initial encounter: Secondary | ICD-10-CM | POA: Diagnosis not present

## 2019-02-21 DIAGNOSIS — S3993XA Unspecified injury of pelvis, initial encounter: Secondary | ICD-10-CM | POA: Diagnosis not present

## 2019-02-21 DIAGNOSIS — M79651 Pain in right thigh: Secondary | ICD-10-CM | POA: Diagnosis not present

## 2019-02-21 HISTORY — DX: Collapsed vertebra, not elsewhere classified, lumbar region, initial encounter for fracture: M48.56XA

## 2019-02-21 HISTORY — DX: Wedge compression fracture of fourth lumbar vertebra, initial encounter for closed fracture: S32.040A

## 2019-02-21 LAB — CBC WITH DIFFERENTIAL/PLATELET
Abs Immature Granulocytes: 0.51 10*3/uL — ABNORMAL HIGH (ref 0.00–0.07)
Basophils Absolute: 0.1 10*3/uL (ref 0.0–0.1)
Basophils Relative: 1 %
Eosinophils Absolute: 0.1 10*3/uL (ref 0.0–0.5)
Eosinophils Relative: 1 %
HCT: 37.3 % (ref 36.0–46.0)
Hemoglobin: 11.7 g/dL — ABNORMAL LOW (ref 12.0–15.0)
Immature Granulocytes: 3 %
Lymphocytes Relative: 22 %
Lymphs Abs: 3.2 10*3/uL (ref 0.7–4.0)
MCH: 28 pg (ref 26.0–34.0)
MCHC: 31.4 g/dL (ref 30.0–36.0)
MCV: 89.2 fL (ref 80.0–100.0)
Monocytes Absolute: 1.5 10*3/uL — ABNORMAL HIGH (ref 0.1–1.0)
Monocytes Relative: 10 %
Neutro Abs: 9.4 10*3/uL — ABNORMAL HIGH (ref 1.7–7.7)
Neutrophils Relative %: 63 %
Platelets: 222 10*3/uL (ref 150–400)
RBC: 4.18 MIL/uL (ref 3.87–5.11)
RDW: 15.5 % (ref 11.5–15.5)
WBC: 14.8 10*3/uL — ABNORMAL HIGH (ref 4.0–10.5)
nRBC: 0.2 % (ref 0.0–0.2)

## 2019-02-21 LAB — COMPREHENSIVE METABOLIC PANEL
ALT: 24 U/L (ref 0–44)
AST: 30 U/L (ref 15–41)
Albumin: 3.6 g/dL (ref 3.5–5.0)
Alkaline Phosphatase: 66 U/L (ref 38–126)
Anion gap: 15 (ref 5–15)
BUN: 23 mg/dL (ref 8–23)
CO2: 27 mmol/L (ref 22–32)
Calcium: 9.2 mg/dL (ref 8.9–10.3)
Chloride: 98 mmol/L (ref 98–111)
Creatinine, Ser: 1.48 mg/dL — ABNORMAL HIGH (ref 0.44–1.00)
GFR calc Af Amer: 39 mL/min — ABNORMAL LOW (ref >60)
GFR calc non Af Amer: 34 mL/min — ABNORMAL LOW (ref >60)
Glucose, Bld: 132 mg/dL — ABNORMAL HIGH (ref 70–99)
Potassium: 4 mmol/L (ref 3.5–5.1)
Sodium: 140 mmol/L (ref 135–145)
Total Bilirubin: 0.7 mg/dL (ref 0.3–1.2)
Total Protein: 7.1 g/dL (ref 6.5–8.1)

## 2019-02-21 MED ORDER — FENTANYL CITRATE (PF) 100 MCG/2ML IJ SOLN
50.0000 ug | Freq: Once | INTRAMUSCULAR | Status: AC
  Administered 2019-02-21: 50 ug via INTRAVENOUS
  Filled 2019-02-21: qty 2

## 2019-02-21 MED ORDER — OXYCODONE-ACETAMINOPHEN 5-325 MG PO TABS: 1.0000 | ORAL_TABLET | ORAL | 0 refills | Status: DC | PRN

## 2019-02-21 MED ORDER — OXYCODONE-ACETAMINOPHEN 5-325 MG PO TABS
1.0000 | ORAL_TABLET | Freq: Once | ORAL | Status: AC
  Administered 2019-02-21: 1 via ORAL
  Filled 2019-02-21: qty 1

## 2019-02-21 NOTE — ED Provider Notes (Signed)
[facility] Emergency Department Provider Note  ____________________________________________   First MD Initiated Contact with Patient 02/21/19 1606     (approximate)  I have reviewed the triage vital signs and the nursing notes.   HISTORY  Chief Complaint Fall    HPI Kerri Carter is a 79 y.o. female presents emergency department after a fall 2 days ago.  States she tripped and fell down approximately 5 steps.  She is continue to have a lot of pain in the right hip and lower back.  States it hurts to stand.  She states also and black and blue on last side and my back.  No numbness or tingling.  No head injury.  Patient recently had Covid on 02/06/2019.      Past Medical History:  Diagnosis Date  . Anxiety   . Asthma   . Bipolar disorder ([place])   . Cataract    s/p b/l repair   . Chicken pox   . CKD (chronic kidney disease)   . CKD (chronic kidney disease), stage III    a. s/p R nephrectomy.  . Conversion disorder   . COPD (chronic obstructive pulmonary disease) ([place])   . Depression   . Essential hypertension   . GERD (gastroesophageal reflux disease)   . Hyperlipidemia   . Inflammatory arthritis    a. hands/carpal tunnel.  b. Low titer rheumatoid factor. c. Negative anti-CCP antibodies. d. Plaquenil.  . Non-Obstructive CAD    a. 07/2009 Cath ([facility]): nonobs dzs;  b. 03/2011 Cath [facility]): nonobs dzs.  . Osteoarthritis    a. Knees.  . PUD (peptic ulcer disease)   . S/P right hip fracture    11/01/16 s/p repair  . Shoulder pain   . Sleep apnea    no cpap  . Spinal stenosis at L4-L5 level    severe with L4/L5 anterolisthesis grade 1 anterolisthesis   . Toxic maculopathy   . Valvular heart disease    a. 07/2015 Echo: EF 55-60%, Mild AI, AS, MR, and TR.    Patient Active Problem List   Diagnosis Date Noted  . Anxiety state 01/20/2019  . Pain due to onychomycosis of toenails of both feet 12/05/2018  . Hav (hallux abducto valgus), unspecified  laterality 12/05/2018  . Status post reverse arthroplasty of shoulder, left 07/26/2018  . Interstitial pulmonary disease ([place]) 07/07/2018  . Fatigue 07/07/2018  . Avascular necrosis of left shoulder due to adverse effect of steroid therapy ([place]) 01/20/2018  . Status post reverse total shoulder replacement, right 11/04/2017  . Leukocytosis 10/19/2017  . Allergic rhinitis 09/28/2017  . Rotator cuff tendinitis, right 07/26/2017  . Strain of right hip 02/26/2017  . History of fracture of left hip 01/15/2017  . Status post lumbar spinal fusion 01/15/2017  . Dislocation of hip joint prosthesis ([place]) 01/08/2017  . Status post hip hemiarthroplasty 01/08/2017  . Leg swelling 12/15/2016  . Hip fracture ([place]) 10/31/2016  . Polyneuropathy 10/28/2016  . Left foot pain 10/19/2016  . Spinal stenosis of lumbar region 09/15/2016  . Osteoporosis 08/27/2016  . Prediabetes 08/25/2016  . Mixed bipolar I disorder ([place]) 10/09/2015  . CKD (chronic kidney disease), stage III   . Essential hypertension   . Conversion disorder 08/04/2015  . Hyperlipidemia 07/17/2015  . 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 Macular degeneration 07/17/2015  . Insomnia 07/17/2015  . Rheumatoid arthritis ([place]) 12/05/2014  . Anxiety and depression 06/13/2013  . Cough 05/02/2012  . Valvular heart disease  10/13/2011  . Asthma-COPD overlap syndrome ([place]) 09/09/2011  . OSA (obstructive sleep apnea) 09/09/2011  . Nocturnal hypoxemia 09/09/2011  . Gastroesophageal reflux disease 02/24/2011  . Single kidney 02/24/2011    Past Surgical History:  Procedure Laterality Date  . APPENDECTOMY    . BACK SURGERY    . BUNIONECTOMY Right   . CATARACT EXTRACTION, BILATERAL    . CESAREAN SECTION     x1  . CHOLECYSTECTOMY N/A 05/11/2016   Procedure: LAPAROSCOPIC CHOLECYSTECTOMY;  Surgeon: Florene Glen, MD;  Location: [facility] ORS;  Service: General;  Laterality: N/A;  . COLONOSCOPY WITH PROPOFOL N/A 04/02/2016     Procedure: COLONOSCOPY WITH PROPOFOL;  Surgeon: Jonathon Bellows, MD;  Location: [facility] ENDOSCOPY;  Service: Endoscopy;  Laterality: N/A;  . ENDOSCOPIC RETROGRADE CHOLANGIOPANCREATOGRAPHY (ERCP) WITH PROPOFOL N/A 05/08/2016   Procedure: ENDOSCOPIC RETROGRADE CHOLANGIOPANCREATOGRAPHY (ERCP) WITH PROPOFOL;  Surgeon: Lucilla Lame, MD;  Location: [facility] ENDOSCOPY;  Service: Endoscopy;  Laterality: N/A;  . ERCP     with biliary spincterotomy 05/08/16 Dr. Allen Norris for choledocholithiasis   . ESOPHAGEAL DILATION  04/02/2016   Procedure: ESOPHAGEAL DILATION;  Surgeon: Jonathon Bellows, MD;  Location: [facility] ENDOSCOPY;  Service: Endoscopy;;  . ESOPHAGOGASTRODUODENOSCOPY (EGD) WITH PROPOFOL N/A 04/02/2016   Procedure: ESOPHAGOGASTRODUODENOSCOPY (EGD) WITH PROPOFOL;  Surgeon: Jonathon Bellows, MD;  Location: [facility] ENDOSCOPY;  Service: Endoscopy;  Laterality: N/A;  . HIP ARTHROPLASTY Right 11/01/2016   Procedure: ARTHROPLASTY BIPOLAR HIP (HEMIARTHROPLASTY);  Surgeon: Corky Mull, MD;  Location: [facility] ORS;  Service: Orthopedics;  Laterality: Right;  . 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 osteoporosis     noted DEXA 08/19/16   . REPLACEMENT TOTAL KNEE Right   . REVERSE SHOULDER ARTHROPLASTY Right 11/04/2017   Procedure: REVERSE SHOULDER ARTHROPLASTY;  Surgeon: Corky Mull, MD;  Location: [facility] ORS;  Service: Orthopedics;  Laterality: Right;  . REVERSE SHOULDER ARTHROPLASTY Left 07/26/2018   Procedure: REVERSE SHOULDER ARTHROPLASTY;  Surgeon: Corky Mull, MD;  Location: [facility] ORS;  Service: Orthopedics;  Laterality: Left;  . TONSILLECTOMY    . TOTAL HIP ARTHROPLASTY  12/10/11   [facility] left hip  . TOTAL HIP ARTHROPLASTY Bilateral   . TUBAL LIGATION      Prior to Admission medications   Medication Sig Start Date End Date Taking? Authorizing Provider  Acetylcysteine 600 MG CAPS Take 1 capsule (600 mg total) by mouth 2 (two) times daily with a meal. 11/02/18   [name], MD   Adalimumab (HUMIRA PEN) 40 MG/0.4ML PNKT Inject 40 mg into the skin every 14 (fourteen) days.     [provider]  albuterol (VENTOLIN HFA) 108 (90 Base) MCG/ACT inhaler Inhale 2 puffs into the lungs every 6 (six) hours as needed. Patient taking differently: Inhale 2 puffs into the lungs every 6 (six) hours as needed for wheezing or shortness of breath.  11/25/15   Juanito Doom, MD  ALPRAZolam Duanne Moron) 0.25 MG tablet Take 1 tablet (0.25 mg total) by mouth at bedtime as needed for anxiety. 01/20/19 04/20/19  Nevada Crane, MD  budesonide (PULMICORT) 0.25 MG/2ML nebulizer solution Take 2 mLs (0.25 mg total) by nebulization 2 (two) times daily. 01/11/19 01/11/20  [name], MD  BYSTOLIC 10 MG tablet TAKE 1 TABLET BY MOUTH EVERY DAY 09/26/18   Juanito Doom, MD  clotrimazole (LOTRIMIN) 1 % cream Apply 1 application topically 2 (two) times daily as needed (for irritation). 12/22/17   McLean-Scocuzza, Nino Glow, MD  dicyclomine (BENTYL) 10 MG capsule TAKE 1 CAPSULE (10 MG TOTAL) BY MOUTH 4 (FOUR) TIMES DAILY - BEFORE MEALS AND AT BEDTIME. 01/17/19 01/12/20  Jonathon Bellows, MD  escitalopram (LEXAPRO) 10 MG tablet Take 1 tablet (10 mg total) by mouth daily. 01/20/19   Nevada Crane, MD  famotidine (PEPCID) 20 MG tablet Take 1 tablet (20 mg total) by mouth daily. Before breakfast or dinner 01/19/19   McLean-Scocuzza, Nino Glow, MD  fluticasone (FLONASE) 50 MCG/ACT nasal spray Place 1 spray into both nostrils 2 (two) times daily. 02/06/19   Cuthriell, Charline Bills, PA-C  fluticasone furoate-vilanterol (BREO ELLIPTA) 200-25 MCG/INH AEPB Inhale 1 puff into the lungs daily. 10/31/18   [name], MD  gabapentin (NEURONTIN) 300 MG capsule Take 2-3 capsules (600-900 mg total) by mouth See admin instructions. Take 600 mg by mouth in the morning and 900 mg at bedtime 08/01/18   McLean-Scocuzza, Nino Glow, MD  ipratropium-albuterol (DUONEB) 0.5-2.5 (3) MG/3ML SOLN Take 3 mLs by nebulization every 8  (eight) hours as needed. 03/25/18   McLean-Scocuzza, Nino Glow, MD  lamoTRIgine (LAMICTAL) 100 MG tablet Take 1 tablet (100 mg total) by mouth 2 (two) times daily. 01/20/19   [place]ada Crane, MD  leflunomide (ARAVA) 20 MG tablet Take 1 tablet (20 mg total) by mouth daily. 09/30/17   McLean-Scocuzza, Nino Glow, MD  loperamide (IMODIUM A-D) 2 MG tablet Take 1 tablet (2 mg total) by mouth 4 (four) times daily as needed for diarrhea or loose stools. 02/06/19   Cuthriell, Charline Bills, PA-C  lovastatin (MEVACOR) 40 MG tablet Take 1 tablet (40 mg total) by mouth at bedtime. 01/21/18   McLean-Scocuzza, Nino Glow, MD  mirabegron ER (MYRBETRIQ) 50 MG TB24 tablet Take 1 tablet (50 mg total) by mouth at bedtime. 09/19/18   Zara Council A, PA-C  montelukast (SINGULAIR) 10 MG tablet Take 1 tablet (10 mg total) by mouth daily. 06/14/18   McLean-Scocuzza, Nino Glow, MD  multivitamin-lutein [facility]) CAPS capsule Take 1 capsule by mouth 2 (two) times daily.    [provider]  mupirocin ointment (BACTROBAN) 2 % Apply 1 application topically 2 (two) times daily. X 1 week 10/19/17   McLean-Scocuzza, Nino Glow, MD  ondansetron (ZOFRAN) 4 MG tablet Take 1 tablet (4 mg total) by mouth every 8 (eight) hours as needed for nausea or vomiting. 03/10/17   McLean-Scocuzza, Nino Glow, MD  ondansetron (ZOFRAN-ODT) 4 MG disintegrating tablet Take 1 tablet (4 mg total) by mouth every 8 (eight) hours as needed for nausea or vomiting. 02/06/19   Cuthriell, Charline Bills, PA-C  oxyCODONE-acetaminophen (PERCOCET) 5-325 MG tablet Take 1 tablet by mouth every 4 (four) hours as needed for severe pain. 02/21/19 02/21/20  Aminta Sakurai, Linden Dolin, PA-C  pantoprazole (PROTONIX) 40 MG tablet Take 1 tablet (40 mg total) by mouth 2 (two) times daily. 30 minutes before food. Note reduction in frequency 10/31/18   [name], MD  predniSONE (DELTASONE) 10 MG tablet 2 tablets (20MG ) for one week, then decrease to 10mg  daily until f/u. 01/09/19   [name], MD  QUEtiapine (SEROQUEL) 25 MG tablet Take 1 tablet (25 mg total) by mouth at bedtime. 01/20/19   Nevada Crane, MD  Spacer/Aero-Holding Chambers (AEROCHAMBER MV) inhaler Use as instructed 10/31/18   [name], MD  sucralfate (CARAFATE) 1 g tablet Take 1 tablet (1 g total) by mouth 4 (four) times daily. TAKE 1 TABLET BY MOUTH 4 TIMES A DAY WITH MEALS AND AT BEDTIME 10/27/18 10/22/19  Jonathon Bellows, MD  Teriparatide, Recombinant, (FORTEO) 600 MCG/2.4ML SOLN Inject 0.08 mLs (20 mcg total) into the skin daily. 11/03/16   Max Sane, MD  Tiotropium Bromide Monohydrate (SPIRIVA RESPIMAT) 2.5 MCG/ACT AERS Inhale 2 puffs into the lungs daily. Patient taking differently: Inhale 2 puffs into the lungs at bedtime.  02/24/18   McLean-Scocuzza, Nino Glow, MD    Allergies Ceftin [cefuroxime axetil]; Lisinopril; Morphine; Sulfasalazine; Aspirin; Antihistamines, chlorpheniramine-type; Antivert [meclizine hcl]; Decongestant [pseudoephedrine hcl]; Doxycycline; Polymyxin b; Sulfa antibiotics; Xarelto [rivaroxaban]; Adhesive [tape]; Iodine; Levaquin [levofloxacin in d5w]; and Tetanus toxoids  Family History  Problem Relation Age of Onset  . Rheum arthritis Mother   . Asthma Mother   . Parkinson's disease Mother   . Heart disease Mother   . Stroke Mother   . Hypertension Mother   . Heart attack Father   . Heart disease Father   . Hypertension Father   . Diabetes Son   . Gout Son   . Asthma Sister   . Heart disease Sister   . Lung cancer Sister   . Heart disease Sister   . Heart disease Sister   . Breast cancer Sister   . Heart attack Sister   . Heart disease Brother   . Heart disease Maternal Grandmother   . Diabetes Maternal Grandmother   . Colon cancer Maternal Grandmother   . Cancer Maternal Grandmother        Hodgkins lymphoma  . Heart disease Brother   . Alcohol abuse Brother   . Depression Brother   . Dementia Son     Social History Social History   Tobacco Use  . Smoking  status: Former Smoker    Packs/day: 0.50    Years: 20.00    Pack years: 10.00    Types: Cigarettes    Quit date: 02/02/1974    Years since quitting: 45.0  . Smokeless tobacco: Never Used  Substance Use Topics  . Alcohol use: No  . Drug use: No    Review of Systems  Constitutional: No fever/chills Eyes: No visual changes. ENT: No sore throat. Respiratory: Denies cough Cardiovascular: Denies chest pain Gastrointestinal: Denies abdominal pain Genitourinary: Negative for dysuria. Musculoskeletal: Positive for back pain and right hip pain Skin: Negative for rash. Psychiatric: no mood changes,     ____________________________________________   PHYSICAL EXAM:  VITAL SIGNS: ED Triage Vitals  Enc Vitals Group     BP 02/21/19 1411 (!) 119/55     Pulse Rate 02/21/19 1407 69     Resp 02/21/19 1407 18     Temp 02/21/19 1407 98.4 F (36.9 C)     Temp Source 02/21/19 1407 Oral     SpO2 02/21/19 1407 95 %     Weight 02/21/19 1408 170 lb (77.1 kg)     Height 02/21/19 1408 5\' 1"  (1.549 m)     Head Circumference --      Peak Flow --      Pain Score 02/21/19 1408 8     Pain Loc --      Pain Edu? --      Excl. in [place]? --     Constitutional: Alert and oriented. Well appearing and in no acute distress. Eyes: Conjunctivae are normal.  Head: Atraumatic. Nose: No congestion/rhinnorhea. Mouth/Throat: Mucous membranes are moist.   Neck:  supple no lymphadenopathy noted Cardiovascular: Normal rate, regular rhythm. Heart sounds are normal Respiratory: Normal respiratory effort.  No retractions, lungs c t a  Abd: soft nontender bs normal  all 4 quad GU: deferred Musculoskeletal: Lumbar spine and right hip tender, right femur is tender midshaft towards the knee, huge hematoma noted along the lower back and right hip.  Neurovascular is intact  neurologic:  Normal speech and language.  Skin:  Skin is warm, dry and intact. No rash noted.  Huge hematoma noted on the lower back and right  hip Psychiatric: Mood and affect are normal. Speech and behavior are normal.  ____________________________________________   LABS (all labs ordered are listed, but only abnormal results are displayed)  Labs Reviewed  COMPREHENSIVE METABOLIC PANEL - Abnormal; Notable for the following components:      Result Value   Glucose, Bld 132 (*)    Creatinine, Ser 1.48 (*)    GFR calc non Af Amer 34 (*)    GFR calc Af Amer 39 (*)    All other components within normal limits  CBC WITH DIFFERENTIAL/PLATELET - Abnormal; Notable for the following components:   WBC 14.8 (*)    Hemoglobin 11.7 (*)    Neutro Abs 9.4 (*)    Monocytes Absolute 1.5 (*)    Abs Immature Granulocytes 0.51 (*)    All other components within normal limits   ____________________________________________   ____________________________________________  RADIOLOGY  X-ray of the right hip is negative for fracture X-ray of the right femur is negative CT lumbar spine and pelvis shows a L4 compression fracture,   ____________________________________________   PROCEDURES  Procedure(s) performed: Fentanyl 50 MCG IV  Procedures    ____________________________________________   INITIAL IMPRESSION / ASSESSMENT AND PLAN / ED COURSE  Pertinent labs & imaging results that were available during my care of the patient were reviewed by me and considered in my medical decision making (see chart for details).   Patient 79 year old female presents emergency department after a fall 2 days ago.  Patient recently had Covid on 02/06/2019.  Patient is complaining of low back pain and right hip pain.  See HPI  Physical exam shows a very large hematoma on the lower back stretching around to the right hip.  Lower back and right hip are tender, right femur is tender  X-ray of the right hip ordered from triage is negative X-ray of the right femur CT of the lumbar spine and pelvis  DDx: Hematoma, contusion, hip fracture, lumbar  spinal fracture, femur fracture  CBC, metabolic panel, saline lock, fentanyl 50 mcg IV  ----------------------------------------- 5:45 PM on 02/21/2019 -----------------------------------------  CBC has elevated WBC of 14.8, comprehensive metabolic panel has glucose of 132 decrease creatinine function and kidney function.  X-ray of the right hip is negative, x-ray of the right femur is negative, CT of the lumbar spine shows a L4 compression fracture, CT of the pelvis negative  Explained the findings to the patient.  She was given a prescription for Percocet and 1 p.o. prior to discharge.  She is to follow-up with [facility] orthopedics and/or neurosurgery.  She states she understands will comply.  She is discharged in stable condition.   Kerri Carter was evaluated in Emergency Department on 02/21/2019 for the symptoms described in the history of present illness. She was evaluated in the context of the global COVID-19 pandemic, which necessitated consideration that the patient might be at risk for infection with the SARS-CoV-2 virus that causes COVID-19. Institutional protocols and algorithms that pertain to the evaluation of patients at risk for COVID-19 are in a state of rapid change based on information released by regulatory bodies including the CDC  and federal and state organizations. These policies and algorithms were followed during the patient's care in the ED.   As part of my medical decision making, I reviewed the following data within the [place] notes reviewed and incorporated, Labs reviewed see above, Old chart reviewed, Radiograph reviewed see above, Notes from prior ED visits and [place] Controlled Substance Database  ____________________________________________   FINAL CLINICAL IMPRESSION(S) / ED DIAGNOSES  Final diagnoses:  Closed compression fracture of L4 vertebra, initial encounter ([place])      NEW MEDICATIONS STARTED DURING THIS  VISIT:  Discharge Medication List as of 02/21/2019  5:32 PM    START taking these medications   Details  oxyCODONE-acetaminophen (PERCOCET) 5-325 MG tablet Take 1 tablet by mouth every 4 (four) hours as needed for severe pain., Starting Tue 02/21/2019, Until Wed 02/21/2020, Normal         Note:  This document was prepared using Dragon voice recognition software and may include unintentional dictation errors.    Versie Starks, PA-C 02/21/19 1748    Lilia Pro., MD 02/21/19 [phone]

## 2019-02-21 NOTE — ED Notes (Signed)
See triage note  Presents s/p fall   States she took a fall on Sunday  States she tripped and fell down approx 5 steps  Having pain to right hip and rib area

## 2019-02-21 NOTE — Discharge Instructions (Addendum)
Follow-up with your regular doctor as needed.  Follow-up with Dr. Rudene Christians for evaluation of the compression fracture.  Return to the emergency department for worsening

## 2019-02-21 NOTE — ED Triage Notes (Addendum)
Pt was taking trash out Sunday and tripped and slid down 5 stairs.  Pain worse when moving and walking.  No blood thinners. No LOC.  Did not hit head.  Large bruise to right lower back and hip.  Pt has not been able to ambulate on right leg.  Pain will not ease.  Hx of fracture in right hip before. No hematuria

## 2019-02-24 ENCOUNTER — Other Ambulatory Visit: Payer: Self-pay | Admitting: Orthopedic Surgery

## 2019-02-24 DIAGNOSIS — W108XXA Fall (on) (from) other stairs and steps, initial encounter: Secondary | ICD-10-CM | POA: Diagnosis not present

## 2019-02-24 DIAGNOSIS — S32040A Wedge compression fracture of fourth lumbar vertebra, initial encounter for closed fracture: Secondary | ICD-10-CM

## 2019-02-24 DIAGNOSIS — I701 Atherosclerosis of renal artery: Secondary | ICD-10-CM | POA: Diagnosis not present

## 2019-02-24 DIAGNOSIS — M545 Low back pain: Secondary | ICD-10-CM | POA: Diagnosis not present

## 2019-03-03 ENCOUNTER — Encounter: Payer: Self-pay | Admitting: Pulmonary Disease

## 2019-03-03 ENCOUNTER — Ambulatory Visit (INDEPENDENT_AMBULATORY_CARE_PROVIDER_SITE_OTHER): Payer: Medicare PPO | Admitting: Pulmonary Disease

## 2019-03-03 DIAGNOSIS — K219 Gastro-esophageal reflux disease without esophagitis: Secondary | ICD-10-CM

## 2019-03-03 DIAGNOSIS — J449 Chronic obstructive pulmonary disease, unspecified: Secondary | ICD-10-CM

## 2019-03-03 DIAGNOSIS — Z8616 Personal history of COVID-19: Secondary | ICD-10-CM | POA: Diagnosis not present

## 2019-03-03 DIAGNOSIS — J849 Interstitial pulmonary disease, unspecified: Secondary | ICD-10-CM | POA: Diagnosis not present

## 2019-03-03 DIAGNOSIS — R05 Cough: Secondary | ICD-10-CM | POA: Diagnosis not present

## 2019-03-03 DIAGNOSIS — R059 Cough, unspecified: Secondary | ICD-10-CM

## 2019-03-03 NOTE — Patient Instructions (Signed)
We will see you in follow-up in 3 months this should be an in person visit

## 2019-03-03 NOTE — Progress Notes (Signed)
   Subjective:    Patient ID: Kerri Carter, female    DOB: Nov 26, 1940, 79 y.o.   MRN: [id number] Virtual Visit Via Video or Telephone Note:   This visit type was conducted due to national recommendations for restrictions regarding the COVID-19 pandemic .  This format is felt to be most appropriate for this patient at this time.  All issues noted in this document were discussed and addressed.  No physical exam was performed (except for noted visual exam findings with Video Visits).    I connected with Kerri Carter by telephone at 11:50 AM and verified that I was speaking with the correct person using two identifiers. Location patient: home Location provider: [facility] Persons participating in the virtual visit: patient, physician   I discussed the limitations, risks, security and privacy concerns of performing an evaluation and management service by video and the availability of in person appointments. The patient expressed understanding and agreed to proceed.   HPI Kerri Carter is a 79 year old former smoker who follows here for the issue of cough, asthma/COPD overlap syndrome and mild interstitial lung disease related to rheumatoid arthritis.  After her last visit we had agreed to reconvene via phone as she has had trepidation with regards to going out due to COVID-19.  Her husband had severe COVID-19 pneumonia she has been tested and also had a "mild case" of COVID-19.  Testing was done outside of our facility.  Remarkably she only had mild GI symptoms but never respiratory symptoms.  She is now recovering well.  She notes that her cough has totally resolved with the use of Pulmicort via nebulizer.  She also has no issues with dyspnea.  She is adhering to her nebulizer therapy with DuoNeb and Pulmicort.  She has had no orthopnea or paroxysmal nocturnal dyspnea.  No lower extremity edema.  No gastroesophageal reflux on Protonix twice a day.  No fevers, chills or sweats.  Has  recovered from her mild COVID-19 infection.  Her main concerns are with regards to her husband who is now hospitalized in the intensive care unit at [facility] after COVID-19 pneumonia and now with a delayed respiratory phase with fibrosis from the virus.  I allowed her to express her concerns during the visit.  Overall she feels markedly better physically.  Review of Systems A 10 point review of systems was performed and it is as noted above otherwise negative.    Objective:   Physical Exam No physical exam performed as this is via telephone visit     Assessment & Plan:  Cough Resolved with Pulmicort twice a day via nebulizer Multifactorial with elements of asthma/COPD and interstitial lung disease and GERD  Asthma/COPD overlap syndrome Moderate defect Function decline from October 2017 Doing well on nebulizers currently Continue DuoNeb and Pulmicort  Interstitial lung disease, mild Likely related to rheumatoid arthritis Continue N-acetylcysteine 600 mg twice a day  Gastroesophageal reflux disease Controlled by Protonix twice a day Continue Protonix for now  Personal history of COVID-19 He has recovered from her mild disease Had mostly GI symptoms No respiratory symptoms or sequela  Total visit time 12 minutes.   Follow-up will be in 3 months time, this should be an in person visit.  Renold Don, MD [facility]   *This note was dictated using voice recognition software/Dragon.  Despite best efforts to proofread, errors can occur which can change the meaning.  Any change was purely unintentional.

## 2019-03-06 ENCOUNTER — Encounter: Admission: RE | Admit: 2019-03-06 | Payer: Medicare PPO | Source: Ambulatory Visit

## 2019-03-06 ENCOUNTER — Ambulatory Visit: Payer: Medicare PPO

## 2019-03-06 ENCOUNTER — Ambulatory Visit: Payer: Medicare Other | Admitting: Podiatry

## 2019-03-06 ENCOUNTER — Other Ambulatory Visit: Payer: Medicare PPO

## 2019-03-07 ENCOUNTER — Other Ambulatory Visit: Payer: Self-pay

## 2019-03-07 ENCOUNTER — Telehealth: Payer: Self-pay | Admitting: Internal Medicine

## 2019-03-07 ENCOUNTER — Other Ambulatory Visit: Payer: Self-pay | Admitting: Internal Medicine

## 2019-03-07 DIAGNOSIS — S32000G Wedge compression fracture of unspecified lumbar vertebra, subsequent encounter for fracture with delayed healing: Secondary | ICD-10-CM

## 2019-03-07 DIAGNOSIS — K219 Gastro-esophageal reflux disease without esophagitis: Secondary | ICD-10-CM

## 2019-03-07 MED ORDER — OXYCODONE-ACETAMINOPHEN 5-325 MG PO TABS: 1.0000 | ORAL_TABLET | Freq: Two times a day (BID) | ORAL | 0 refills | Status: DC | PRN

## 2019-03-07 MED ORDER — PANTOPRAZOLE SODIUM 40 MG PO TBEC: 40.0000 mg | DELAYED_RELEASE_TABLET | Freq: Two times a day (BID) | ORAL | 1 refills | Status: DC

## 2019-03-07 MED ORDER — PANTOPRAZOLE SODIUM 40 MG PO TBEC: 40.0000 mg | DELAYED_RELEASE_TABLET | Freq: Two times a day (BID) | ORAL | 3 refills | Status: DC

## 2019-03-07 NOTE — Telephone Encounter (Signed)
I called patient to let her know that I refilled protonix. I told her it appeared that she normally got refills from ortho. She said that she just saw them but at the time was not in need. She has recently hurt her back once again & wanted to have medication on hand for when she goes out of town. She thought maybe you would refill given the circumstances. I told her that I would send this to you but she really needed to call ortho since this is not a medication you had been prescribing for patient.

## 2019-03-07 NOTE — Telephone Encounter (Signed)
Pt needs refill on oxyCODONE-acetaminophen (PERCOCET) 5-325 MG tablet before they leave to go to [place] for a funera on Thursday. She also needs a refill on pantoprazole (PROTONIX) 40 MG tablet

## 2019-03-13 ENCOUNTER — Telehealth: Payer: Self-pay | Admitting: Internal Medicine

## 2019-03-13 NOTE — Telephone Encounter (Signed)
Pt's granddaughter states that mental health has declined since the passing of pt's husband. Pt has fallen several times but states that she is not hurt. She states that her last fall-pt urinated on herself. Pt's granddaughter is not sure what to do and wants to know if she needs home health nurse. Please call [facility] asap at [phone]

## 2019-03-13 NOTE — Telephone Encounter (Signed)
Please make an appt virtually asap  She may need H/H  Call [facility] to schedule   [place]

## 2019-03-14 NOTE — Telephone Encounter (Signed)
Appointment has been scheduled for Thursday.

## 2019-03-16 ENCOUNTER — Ambulatory Visit (INDEPENDENT_AMBULATORY_CARE_PROVIDER_SITE_OTHER): Payer: Medicare PPO | Admitting: Internal Medicine

## 2019-03-16 ENCOUNTER — Other Ambulatory Visit: Payer: Self-pay

## 2019-03-16 ENCOUNTER — Encounter: Payer: Self-pay | Admitting: Internal Medicine

## 2019-03-16 VITALS — Ht 61.0 in | Wt 170.0 lb

## 2019-03-16 DIAGNOSIS — S32040D Wedge compression fracture of fourth lumbar vertebra, subsequent encounter for fracture with routine healing: Secondary | ICD-10-CM | POA: Diagnosis not present

## 2019-03-16 DIAGNOSIS — F4329 Adjustment disorder with other symptoms: Secondary | ICD-10-CM | POA: Diagnosis not present

## 2019-03-16 DIAGNOSIS — R1900 Intra-abdominal and pelvic swelling, mass and lump, unspecified site: Secondary | ICD-10-CM | POA: Diagnosis not present

## 2019-03-16 DIAGNOSIS — F4321 Adjustment disorder with depressed mood: Secondary | ICD-10-CM

## 2019-03-16 DIAGNOSIS — N9489 Other specified conditions associated with female genital organs and menstrual cycle: Secondary | ICD-10-CM | POA: Diagnosis not present

## 2019-03-16 DIAGNOSIS — R5381 Other malaise: Secondary | ICD-10-CM | POA: Diagnosis not present

## 2019-03-16 DIAGNOSIS — R7303 Prediabetes: Secondary | ICD-10-CM | POA: Diagnosis not present

## 2019-03-16 DIAGNOSIS — Z9181 History of falling: Secondary | ICD-10-CM | POA: Diagnosis not present

## 2019-03-16 HISTORY — DX: Adjustment disorder with depressed mood: F43.21

## 2019-03-16 HISTORY — DX: History of falling: Z91.81

## 2019-03-16 HISTORY — DX: Intra-abdominal and pelvic swelling, mass and lump, unspecified site: R19.00

## 2019-03-16 NOTE — Progress Notes (Signed)
Telephone   I connected with Kerri Carter  on 03/16/19 at 10:15 AM EST by telephone and verified that I am speaking with the correct person using two identifiers.  Location patient: home Location provider:work or home office Persons participating in the virtual visit: patient, provider, pts grand daughter in [place] [phone]  I discussed the limitations of evaluation and management by telemedicine and the availability of in person appointments. The patient expressed understanding and agreed to proceed.   HPI: 1. Grief:  Pts husband died (64 years married) covid 19 03/05/19 and since worsening depression with lack of self care (I.e not now want to practice good hygiene which is not like her stooling on herself), not showering last was 03/08/19 and she has to be prompted a lot to care for herself.  She lives with grandson and his wife and she is arguing more, agitated, wants to sleep more, more forgetful and confused and this is not like her, less self care, confused/rambling PHQ 9 score 16, hallucinations and rambling. She also keeps falling had about 10 falls in the last 4-6 weeks and 2 falls 03/16/19 day of visit even though she has a walker.  Morey Hummingbird states pt has no will to move, get out of bed or help herself and this waxes and wanes and was worse on 03/15/19. She has reduce appetite Grandaughter in law has trouble caring for pt she lives with her she weighs 80 lbs and has a small infant/toddler child -will CC Dr. Nevada Crane try to get pt appt and appt grief counseling   grandaughter in law is tearful on the phone today   2. Fall ~02/21/19 with CT lumbar with mild compression fracture L4  And he rec spect bone scan as of 02/24/19 appt as she had prior fracture around prior fusion if this is the case rec kyphoplasty in this area  Will have pt f/u with Dr. Rudene Christians ortho -of note as of 03/27/19 this nm spect bone scan has not been scheduled   She is in pain 4/10 today at times 8-9/10 given  percocet 5-325 mg bid prn but pt should not be out if needs chronically will ask ortho to refill or may need to establish with pain clinic   Pt does not think she can afford a SNF will try to get H/H PT/OT, aid and RN  3. CT pelvis 02/21/19 cystic left adnexal lesion enlarged since 2018 1.8 x 1.7 [place] rec now 3 x 2.4 cm will order this   ROS: See pertinent positives and negatives per HPI.  Past Medical History:  Diagnosis Date  . Anxiety   . Asthma   . Bipolar disorder ([place])   . Cataract    s/p b/l repair   . Chicken pox   . CKD (chronic kidney disease)   . CKD (chronic kidney disease), stage III    a. s/p R nephrectomy.  . Conversion disorder   . COPD (chronic obstructive pulmonary disease) ([place])   . Depression   . Essential hypertension   . GERD (gastroesophageal reflux disease)   . Hyperlipidemia   . Inflammatory arthritis    a. hands/carpal tunnel.  b. Low titer rheumatoid factor. c. Negative anti-CCP antibodies. d. Plaquenil.  . Non-Obstructive CAD    a. 07/2009 Cath ([facility]): nonobs dzs;  b. 03/2011 Cath [facility]): nonobs dzs.  . Osteoarthritis    a. Knees.  . PUD (peptic ulcer disease)   . S/P right hip fracture    11/01/16 s/p  repair  . Shoulder pain   . Sleep apnea    no cpap  . Spinal stenosis at L4-L5 level    severe with L4/L5 anterolisthesis grade 1 anterolisthesis   . Toxic maculopathy   . Valvular heart disease    a. 07/2015 Echo: EF 55-60%, Mild AI, AS, MR, and TR.    Past Surgical History:  Procedure Laterality Date  . APPENDECTOMY    . BACK SURGERY    . BUNIONECTOMY Right   . CATARACT EXTRACTION, BILATERAL    . CESAREAN SECTION     x1  . CHOLECYSTECTOMY N/A 05/11/2016   Procedure: LAPAROSCOPIC CHOLECYSTECTOMY;  Surgeon: Florene Glen, MD;  Location: [facility] ORS;  Service: General;  Laterality: N/A;  . COLONOSCOPY WITH PROPOFOL N/A 04/02/2016   Procedure: COLONOSCOPY WITH PROPOFOL;  Surgeon: Jonathon Bellows, MD;  Location: [facility] ENDOSCOPY;  Service: Endoscopy;   Laterality: N/A;  . ENDOSCOPIC RETROGRADE CHOLANGIOPANCREATOGRAPHY (ERCP) WITH PROPOFOL N/A 05/08/2016   Procedure: ENDOSCOPIC RETROGRADE CHOLANGIOPANCREATOGRAPHY (ERCP) WITH PROPOFOL;  Surgeon: Lucilla Lame, MD;  Location: [facility] ENDOSCOPY;  Service: Endoscopy;  Laterality: N/A;  . ERCP     with biliary spincterotomy 05/08/16 Dr. Allen Norris for choledocholithiasis   . ESOPHAGEAL DILATION  04/02/2016   Procedure: ESOPHAGEAL DILATION;  Surgeon: Jonathon Bellows, MD;  Location: [facility] ENDOSCOPY;  Service: Endoscopy;;  . ESOPHAGOGASTRODUODENOSCOPY (EGD) WITH PROPOFOL N/A 04/02/2016   Procedure: ESOPHAGOGASTRODUODENOSCOPY (EGD) WITH PROPOFOL;  Surgeon: Jonathon Bellows, MD;  Location: [facility] ENDOSCOPY;  Service: Endoscopy;  Laterality: N/A;  . HIP ARTHROPLASTY Right 11/01/2016   Procedure: ARTHROPLASTY BIPOLAR HIP (HEMIARTHROPLASTY);  Surgeon: Corky Mull, MD;  Location: [facility] ORS;  Service: Orthopedics;  Laterality: Right;  . 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 osteoporosis     noted DEXA 08/19/16   . REPLACEMENT TOTAL KNEE Right   . REVERSE SHOULDER ARTHROPLASTY Right 11/04/2017   Procedure: REVERSE SHOULDER ARTHROPLASTY;  Surgeon: Corky Mull, MD;  Location: [facility] ORS;  Service: Orthopedics;  Laterality: Right;  . REVERSE SHOULDER ARTHROPLASTY Left 07/26/2018   Procedure: REVERSE SHOULDER ARTHROPLASTY;  Surgeon: Corky Mull, MD;  Location: [facility] OR[facility];  Service: Orthopedics;  Laterality: Left;  . TONSILLECTOMY    . TOTAL HIP ARTHROPLASTY  12/10/11   [facility] left hip  . TOTAL HIP ARTHROPLASTY Bilateral   . TUBAL LIGATION      Family History  Problem Relation Age of Onset  . Rheum arthritis Mother   . Asthma Mother   . Parkinson's disease Mother   . Heart disease Mother   . Stroke Mother   . Hypertension Mother   . Heart attack Father   . Heart disease Father   . Hypertension Father   . Diabetes Son   . Gout Son   . Asthma Sister   . Heart disease Sister   . Lung  cancer Sister   . Heart disease Sister   . Heart disease Sister   . Breast cancer Sister   . Heart attack Sister   . Heart disease Brother   . Heart disease Maternal Grandmother   . Diabetes Maternal Grandmother   . Colon cancer Maternal Grandmother   . Cancer Maternal Grandmother        Hodgkins lymphoma  . Heart disease Brother   . Alcohol abuse Brother   . Depression Brother   . Dementia Son     SOCIAL HX:  Lives at home in [place] grandson, his wife and their  child. Husband Xion Michalak died covid 77 late 1/2076married x 51 years Right-handed. 6 cups coffee per day.   Current Outpatient Medications:  .  Acetylcysteine 600 MG CAPS, Take 1 capsule (600 mg total) by mouth 2 (two) times daily with a meal., Disp: 60 capsule, Rfl: 2 .  albuterol (VENTOLIN HFA) 108 (90 Base) MCG/ACT inhaler, Inhale 2 puffs into the lungs every 6 (six) hours as needed., Disp: 1 Inhaler, Rfl: 5 .  ALPRAZolam (XANAX) 0.25 MG tablet, Take 1 tablet (0.25 mg total) by mouth at bedtime as needed for anxiety., Disp: 90 tablet, Rfl: 0 .  budesonide (PULMICORT) 0.25 MG/2ML nebulizer solution, Take 2 mLs (0.25 mg total) by nebulization 2 (two) times daily., Disp: 150 mL, Rfl: 11 .  BYSTOLIC 10 MG tablet, TAKE 1 TABLET BY MOUTH EVERY DAY, Disp: 30 tablet, Rfl: 2 .  dicyclomine (BENTYL) 10 MG capsule, TAKE 1 CAPSULE (10 MG TOTAL) BY MOUTH 4 (FOUR) TIMES DAILY - BEFORE MEALS AND AT BEDTIME., Disp: 360 capsule, Rfl: 3 .  escitalopram (LEXAPRO) 10 MG tablet, Take 1 tablet (10 mg total) by mouth daily., Disp: 90 tablet, Rfl: 0 .  famotidine (PEPCID) 20 MG tablet, Take 1 tablet (20 mg total) by mouth daily. Before breakfast or dinner, Disp: 90 tablet, Rfl: 3 .  fluticasone (FLONASE) 50 MCG/ACT nasal spray, Place 1 spray into both nostrils 2 (two) times daily., Disp: 16 g, Rfl: 0 .  fluticasone furoate-vilanterol (BREO ELLIPTA) 200-25 MCG/INH AEPB, Inhale 1 puff into the lungs daily., Disp: 28 each, Rfl: 11 .   gabapentin (NEURONTIN) 300 MG capsule, Take 2-3 capsules (600-900 mg total) by mouth See admin instructions. Take 600 mg by mouth in the morning and 900 mg at bedtime, Disp: 450 capsule, Rfl: 1 .  ipratropium-albuterol (DUONEB) 0.5-2.5 (3) MG/3ML SOLN, Take 3 mLs by nebulization every 8 (eight) hours as needed., Disp: 360 mL, Rfl: 12 .  lamoTRIgine (LAMICTAL) 100 MG tablet, Take 1 tablet (100 mg total) by mouth 2 (two) times daily., Disp: 180 tablet, Rfl: 0 .  leflunomide (ARAVA) 20 MG tablet, Take 1 tablet (20 mg total) by mouth daily., Disp: , Rfl:  .  loperamide (IMODIUM A-D) 2 MG tablet, Take 1 tablet (2 mg total) by mouth 4 (four) times daily as needed for diarrhea or loose stools., Disp: 30 tablet, Rfl: 0 .  lovastatin (MEVACOR) 40 MG tablet, Take 1 tablet (40 mg total) by mouth at bedtime., Disp: 90 tablet, Rfl: 3 .  mirabegron ER (MYRBETRIQ) 50 MG TB24 tablet, Take 1 tablet (50 mg total) by mouth at bedtime., Disp: 30 tablet, Rfl: 5 .  montelukast (SINGULAIR) 10 MG tablet, Take 1 tablet (10 mg total) by mouth daily., Disp: 90 tablet, Rfl: 3 .  multivitamin-lutein (OCUVITE-LUTEIN) CAPS capsule, Take 1 capsule by mouth 2 (two) times daily., Disp: , Rfl:  .  oxyCODONE-acetaminophen (PERCOCET) 5-325 MG tablet, Take 1 tablet by mouth 2 (two) times daily as needed for severe pain., Disp: 30 tablet, Rfl: 0 .  pantoprazole (PROTONIX) 40 MG tablet, Take 1 tablet (40 mg total) by mouth 2 (two) times daily. 30 minutes before food. Note reduction in frequency, Disp: 180 tablet, Rfl: 3 .  predniSONE (DELTASONE) 10 MG tablet, 2 tablets (20MG ) for one week, then decrease to 10mg  daily until f/u., Disp: 45 tablet, Rfl: 0 .  QUEtiapine (SEROQUEL) 25 MG tablet, Take 1 tablet (25 mg total) by mouth at bedtime., Disp: 90 tablet, Rfl: 0 .  sucralfate (CARAFATE) 1 g  tablet, Take 1 tablet (1 g total) by mouth 4 (four) times daily. TAKE 1 TABLET BY MOUTH 4 TIMES A DAY WITH MEALS AND AT BEDTIME, Disp: 360 tablet, Rfl:  3 .  Adalimumab (HUMIRA PEN) 40 MG/0.4ML PNKT, Inject 40 mg into the skin every 14 (fourteen) days. , Disp: , Rfl:  .  mupirocin ointment (BACTROBAN) 2 %, Apply 1 application topically 2 (two) times daily. X 1 week (Patient not taking: Reported on 03/16/2019), Disp: 30 g, Rfl: 0 .  ondansetron (ZOFRAN) 4 MG tablet, Take 1 tablet (4 mg total) by mouth every 8 (eight) hours as needed for nausea or vomiting. (Patient not taking: Reported on 03/16/2019), Disp: 30 tablet, Rfl: 0  EXAM:  VITALS per patient if applicable:  GENERAL: alert, oriented, appears well and in no acute distress  PSYCH/NEURO: pleasant and cooperative, per carrie + confusion, lack of self care,+ depression. +grief  ASSESSMENT AND PLAN:  Discussed the following assessment and plan:  Complicated grief appt psych Dr. Nevada Crane 04/18/19 9 am will see if can be worked in sooner  Also needs grief therapy   Compression fracture of L4 vertebra with routine healing, subsequent encounter F/u Dr. Rudene Christians still needs NM spect test  Also will need pain control with ortho vs pain clinic in future  And he rec spect bone scan as of 02/24/19 appt as she had prior fracture around prior fusion if this is the case rec kyphoplasty in this area  Will have pt f/u with Dr. Rudene Christians ortho  At high risk for falls Will get h/h PT/OT, aid and RN  Enlarging Pelvic mass in female since 2018 - Plan: US Pelvic Complete With Transvaginal Adnexal mass - Plan: US Pelvic Complete With Transvaginal  HM Had flu8/31/20 pulm clinic, prevnar, pna 23utd allergic to Tdap declines shingrix  covid 19 vaccine has not had  mammo3/24/2020 repeat normal referred again will need to be schedule  Colonoscopy 04/02/16 multiple polyps but tortuous colon GI does not want to do any further  Out of age window pap  DEXA 08/19/16 +osteoporosis on Forteo will need to repeat 1 year after Forteo use to see improvement rheumatology following. -DEXA 10/12/17 with T score -4.1  worse on forteounable to do any further bone densitiesper pt 07/07/2018 due to h/o multiple ortho surgeries +Osteoporosis  Off forteo was on x 2 years F/u [facility] rheumatology Dr. Jefm Bryant   Seeing dermatology in [place] changed referral [facility] seen 12/01/18 to get closer to home Retinal specialist 08/2017, wasscheduled 12/2018   -we discussed possible serious and likely etiologies, options for evaluation and workup, limitations of telemedicine visit vs in person visit, treatment, treatment risks and precautions. Pt prefers to treat via telemedicine empirically rather then risking or undertaking an in person visit at this moment. Patient agrees to seek prompt in person care if worsening, new symptoms arise, or if is not improving with treatment.   I discussed the assessment and treatment plan with the patient. The patient was provided an opportunity to ask questions and all were answered. The patient agreed with the plan and demonstrated an understanding of the instructions.   The patient was advised to call back or seek an in-person evaluation if the symptoms worsen or if the condition fails to improve as anticipated.  Time spent 30 minutes treatment plan and coordinate of care with psych, ortho, further w/u pelvic mass  Delorise Jackson, MD

## 2019-03-20 DIAGNOSIS — H353211 Exudative age-related macular degeneration, right eye, with active choroidal neovascularization: Secondary | ICD-10-CM | POA: Diagnosis not present

## 2019-03-24 IMAGING — DX DG CHEST 2V
2 series · 2 of 2 positions shown · non-contrast
Comparison: PA and lateral chest 12/15/2016.

CLINICAL DATA: Chest congestion and cough for several weeks.

EXAM:
CHEST - 2 VIEW

[chest pa]
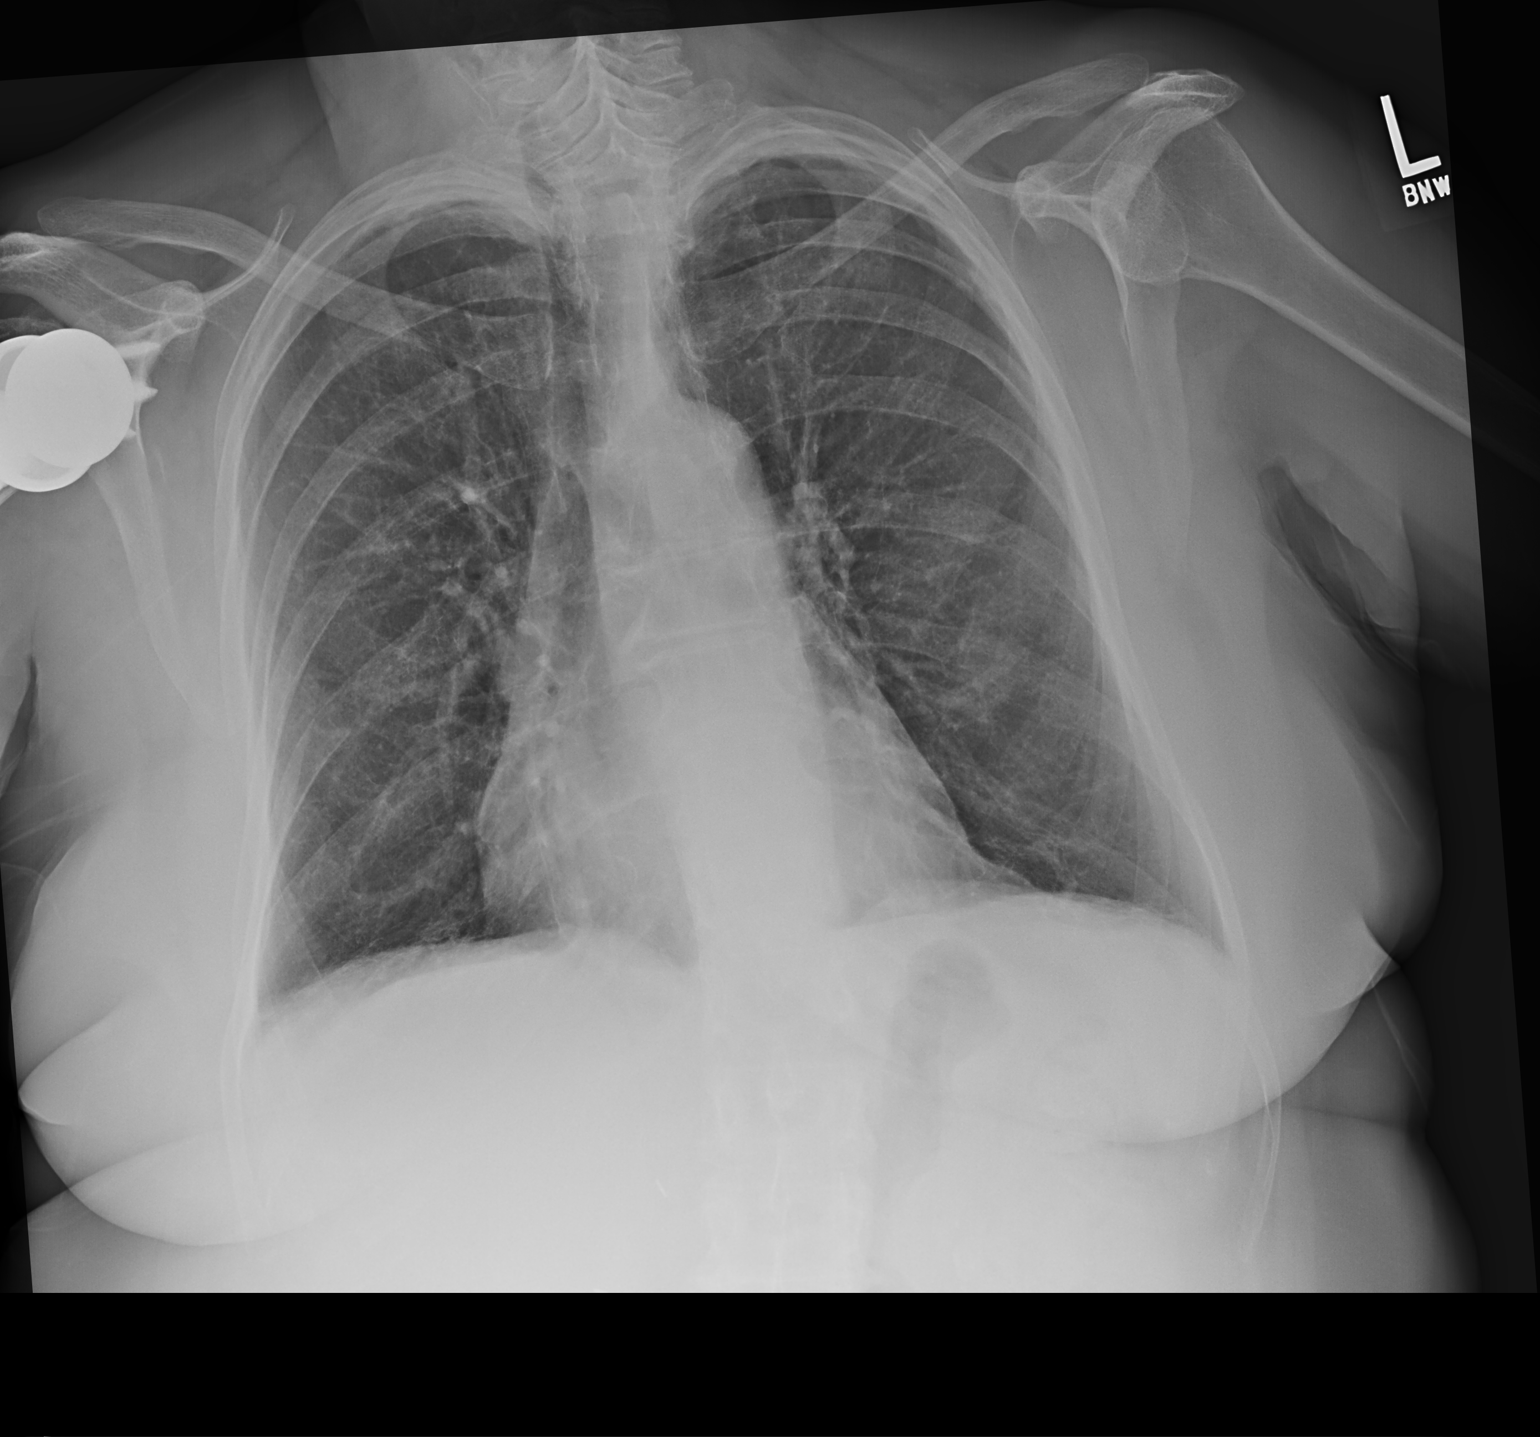

[chest lat]
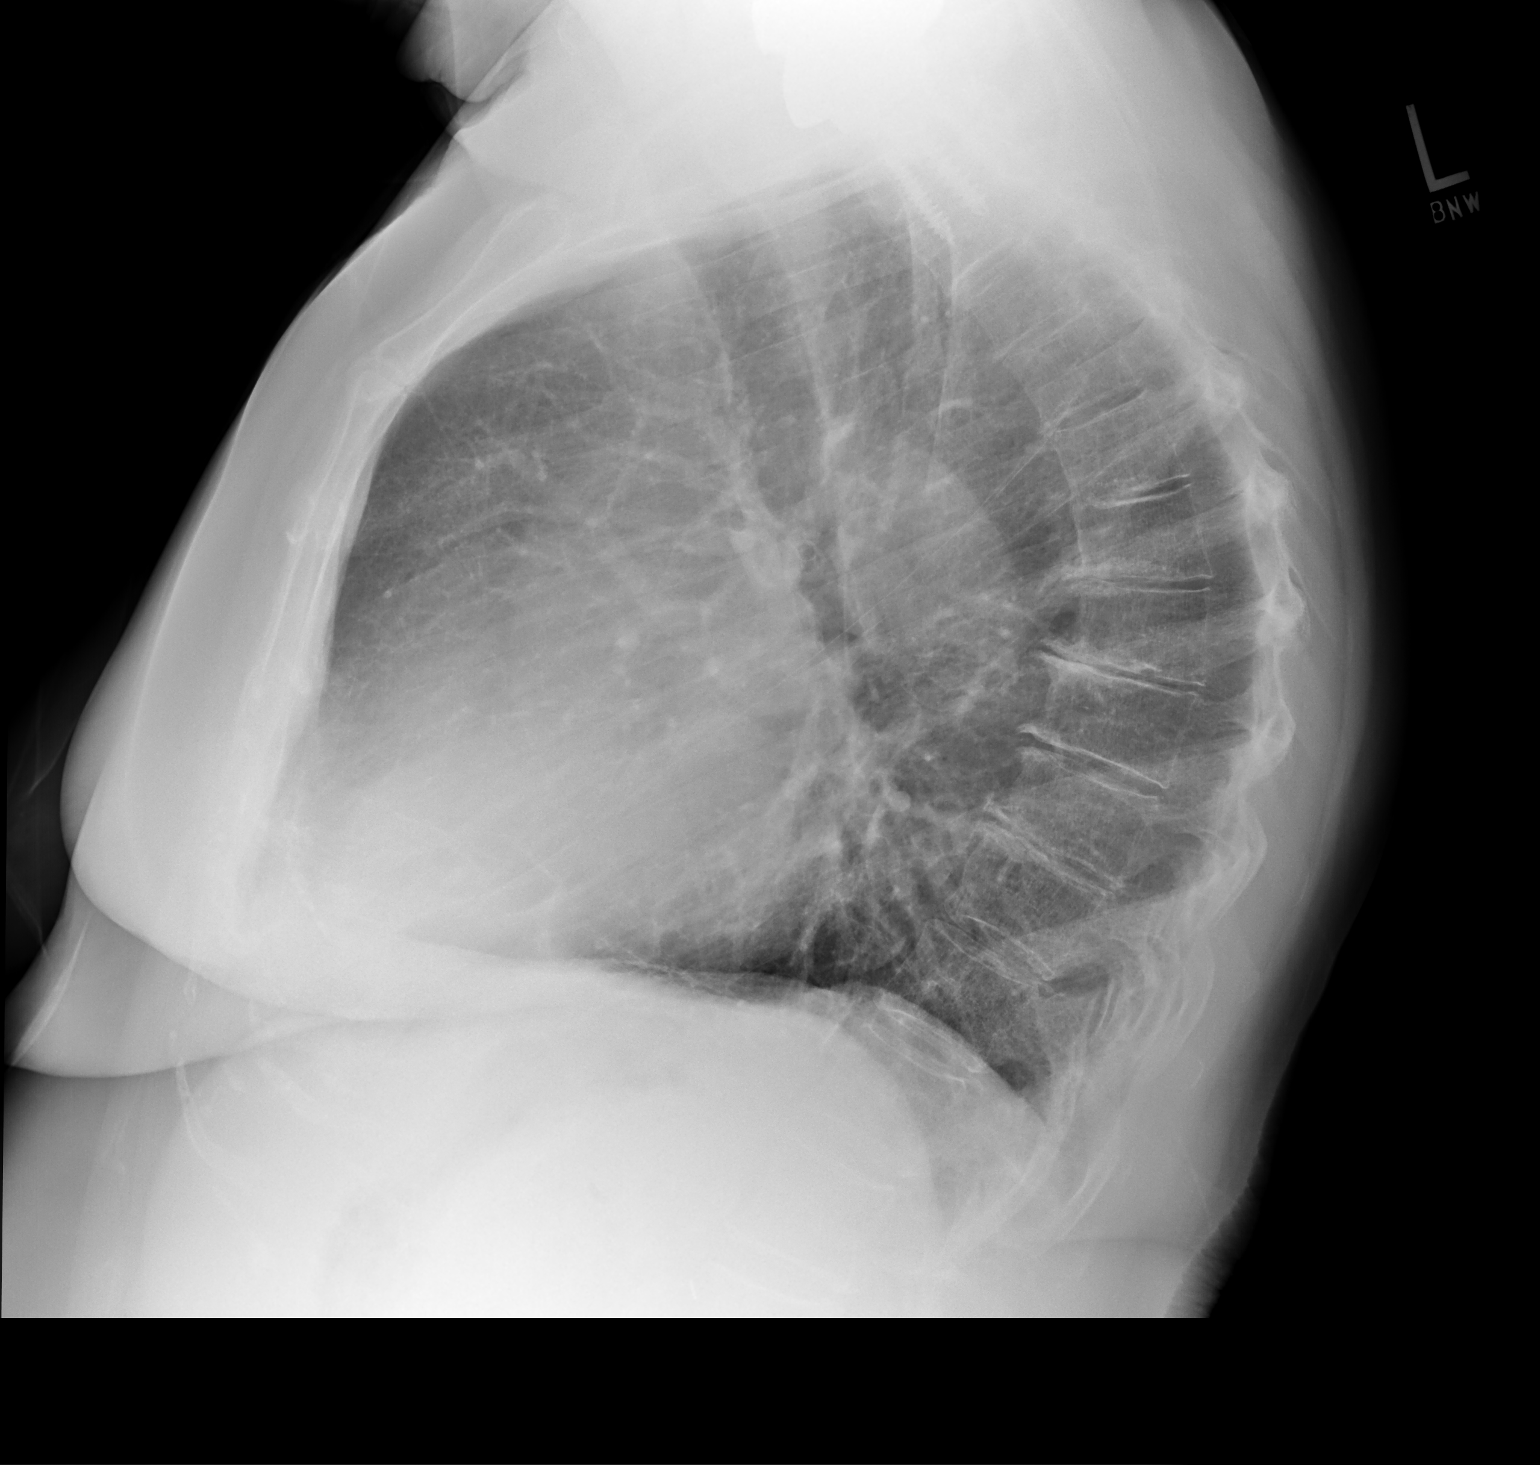

[2 of 2 positions shown; findings below may reference images not displayed]

FINDINGS: The lungs are clear. Heart size is normal. No pneumothorax or
pleural effusion. No acute or focal bony abnormality. The patient
has undergone right shoulder replacement since the prior exam.
IMPRESSION: No acute disease.

## 2019-03-27 ENCOUNTER — Encounter: Payer: Self-pay | Admitting: Internal Medicine

## 2019-03-28 ENCOUNTER — Telehealth: Payer: Self-pay | Admitting: Internal Medicine

## 2019-03-28 ENCOUNTER — Telehealth: Payer: Self-pay

## 2019-03-28 NOTE — Telephone Encounter (Signed)
-----   Message from Delorise Jackson, MD sent at 03/27/2019  9:23 PM EST ----- Fax Dr. Rudene Christians ortho urgent on cover needs f/u pain persistent due to L4 compression fx CT 02/21/19 scan and no scheduling of nm spect scan as of 03/27/19 [facility] [place], [place] [address] [phone]  Lauris Poag, MD [address] [facility] [place], [place] [address] [phone] (Fax)

## 2019-03-28 NOTE — Telephone Encounter (Signed)
Information faxed to the number documented.

## 2019-03-28 NOTE — Telephone Encounter (Signed)
-----   Message from Delorise Jackson, MD sent at 03/27/2019  9:22 PM EST ----- Fax note to [facility] urgent Dr. Nevada Crane needs psych appt and grief counseling put on cover sheet [facility]

## 2019-04-01 DIAGNOSIS — N183 Chronic kidney disease, stage 3 unspecified: Secondary | ICD-10-CM | POA: Diagnosis not present

## 2019-04-01 DIAGNOSIS — M8008XD Age-related osteoporosis with current pathological fracture, vertebra(e), subsequent encounter for fracture with routine healing: Secondary | ICD-10-CM | POA: Diagnosis not present

## 2019-04-01 DIAGNOSIS — I129 Hypertensive chronic kidney disease with stage 1 through stage 4 chronic kidney disease, or unspecified chronic kidney disease: Secondary | ICD-10-CM | POA: Diagnosis not present

## 2019-04-01 DIAGNOSIS — M48061 Spinal stenosis, lumbar region without neurogenic claudication: Secondary | ICD-10-CM | POA: Diagnosis not present

## 2019-04-01 DIAGNOSIS — S41111D Laceration without foreign body of right upper arm, subsequent encounter: Secondary | ICD-10-CM | POA: Diagnosis not present

## 2019-04-01 DIAGNOSIS — F4329 Adjustment disorder with other symptoms: Secondary | ICD-10-CM | POA: Diagnosis not present

## 2019-04-01 DIAGNOSIS — F319 Bipolar disorder, unspecified: Secondary | ICD-10-CM | POA: Diagnosis not present

## 2019-04-01 DIAGNOSIS — F419 Anxiety disorder, unspecified: Secondary | ICD-10-CM | POA: Diagnosis not present

## 2019-04-01 DIAGNOSIS — J449 Chronic obstructive pulmonary disease, unspecified: Secondary | ICD-10-CM | POA: Diagnosis not present

## 2019-04-03 ENCOUNTER — Telehealth: Payer: Self-pay | Admitting: Internal Medicine

## 2019-04-03 NOTE — Telephone Encounter (Signed)
Okay for orders? 

## 2019-04-03 NOTE — Telephone Encounter (Signed)
Please give verbal for nursing im covering for patients primary this week

## 2019-04-03 NOTE — Telephone Encounter (Signed)
Estill Bamberg [facility] [phone]  Verbal Orders for Nursing  1x a week for 4 weeks

## 2019-04-04 DIAGNOSIS — F4329 Adjustment disorder with other symptoms: Secondary | ICD-10-CM | POA: Diagnosis not present

## 2019-04-04 DIAGNOSIS — M8008XD Age-related osteoporosis with current pathological fracture, vertebra(e), subsequent encounter for fracture with routine healing: Secondary | ICD-10-CM | POA: Diagnosis not present

## 2019-04-04 DIAGNOSIS — I340 Nonrheumatic mitral (valve) insufficiency: Secondary | ICD-10-CM | POA: Diagnosis not present

## 2019-04-04 DIAGNOSIS — N183 Chronic kidney disease, stage 3 unspecified: Secondary | ICD-10-CM | POA: Diagnosis not present

## 2019-04-04 DIAGNOSIS — J449 Chronic obstructive pulmonary disease, unspecified: Secondary | ICD-10-CM | POA: Diagnosis not present

## 2019-04-04 DIAGNOSIS — F319 Bipolar disorder, unspecified: Secondary | ICD-10-CM | POA: Diagnosis not present

## 2019-04-04 DIAGNOSIS — I351 Nonrheumatic aortic (valve) insufficiency: Secondary | ICD-10-CM | POA: Diagnosis not present

## 2019-04-04 DIAGNOSIS — M48061 Spinal stenosis, lumbar region without neurogenic claudication: Secondary | ICD-10-CM | POA: Diagnosis not present

## 2019-04-04 DIAGNOSIS — F419 Anxiety disorder, unspecified: Secondary | ICD-10-CM | POA: Diagnosis not present

## 2019-04-04 DIAGNOSIS — I10 Essential (primary) hypertension: Secondary | ICD-10-CM | POA: Diagnosis not present

## 2019-04-04 DIAGNOSIS — I129 Hypertensive chronic kidney disease with stage 1 through stage 4 chronic kidney disease, or unspecified chronic kidney disease: Secondary | ICD-10-CM | POA: Diagnosis not present

## 2019-04-04 DIAGNOSIS — S41111D Laceration without foreign body of right upper arm, subsequent encounter: Secondary | ICD-10-CM | POA: Diagnosis not present

## 2019-04-04 NOTE — Telephone Encounter (Signed)
I called & gave Estill Bamberg verbal orders.

## 2019-04-05 ENCOUNTER — Telehealth: Payer: Self-pay | Admitting: Internal Medicine

## 2019-04-05 ENCOUNTER — Ambulatory Visit: Payer: Medicare Other | Admitting: Internal Medicine

## 2019-04-05 ENCOUNTER — Ambulatory Visit
Admission: RE | Admit: 2019-04-05 | Discharge: 2019-04-05 | Disposition: A | Discharge status: Home / Self Care | Payer: Medicare PPO | Source: Ambulatory Visit | Attending: Internal Medicine | Admitting: Internal Medicine

## 2019-04-05 ENCOUNTER — Other Ambulatory Visit: Payer: Self-pay

## 2019-04-05 DIAGNOSIS — N9489 Other specified conditions associated with female genital organs and menstrual cycle: Secondary | ICD-10-CM | POA: Insufficient documentation

## 2019-04-05 DIAGNOSIS — R1900 Intra-abdominal and pelvic swelling, mass and lump, unspecified site: Secondary | ICD-10-CM

## 2019-04-05 DIAGNOSIS — R1909 Other intra-abdominal and pelvic swelling, mass and lump: Secondary | ICD-10-CM | POA: Diagnosis not present

## 2019-04-05 DIAGNOSIS — N83202 Unspecified ovarian cyst, left side: Secondary | ICD-10-CM | POA: Diagnosis not present

## 2019-04-05 NOTE — Telephone Encounter (Signed)
STAT Ultrasound Report called in: Simple appearing cystic structure in the left ovary measuring up to 2.9 cm. Radiologist states while this is enlarged since prior UltrSounds it has benign characteristics. States that continuing to follow up with UltraSounds could be preformed to ensure stability.   Please advise

## 2019-04-05 NOTE — Telephone Encounter (Signed)
err

## 2019-04-06 DIAGNOSIS — N183 Chronic kidney disease, stage 3 unspecified: Secondary | ICD-10-CM | POA: Diagnosis not present

## 2019-04-06 DIAGNOSIS — J449 Chronic obstructive pulmonary disease, unspecified: Secondary | ICD-10-CM | POA: Diagnosis not present

## 2019-04-06 DIAGNOSIS — F419 Anxiety disorder, unspecified: Secondary | ICD-10-CM | POA: Diagnosis not present

## 2019-04-06 DIAGNOSIS — F319 Bipolar disorder, unspecified: Secondary | ICD-10-CM | POA: Diagnosis not present

## 2019-04-06 DIAGNOSIS — S41111D Laceration without foreign body of right upper arm, subsequent encounter: Secondary | ICD-10-CM | POA: Diagnosis not present

## 2019-04-06 DIAGNOSIS — F4329 Adjustment disorder with other symptoms: Secondary | ICD-10-CM | POA: Diagnosis not present

## 2019-04-06 DIAGNOSIS — M48061 Spinal stenosis, lumbar region without neurogenic claudication: Secondary | ICD-10-CM | POA: Diagnosis not present

## 2019-04-06 DIAGNOSIS — I129 Hypertensive chronic kidney disease with stage 1 through stage 4 chronic kidney disease, or unspecified chronic kidney disease: Secondary | ICD-10-CM | POA: Diagnosis not present

## 2019-04-06 DIAGNOSIS — M8008XD Age-related osteoporosis with current pathological fracture, vertebra(e), subsequent encounter for fracture with routine healing: Secondary | ICD-10-CM | POA: Diagnosis not present

## 2019-04-07 NOTE — Telephone Encounter (Signed)
   [place] with granddaughter and patient was in background talking as well Patient knew that left ovarian cyst had grown in size as she had read results via mychart. States she has had this cyst for years. No pelvic pain. Feels well today.  I offered an appointment to GYN for further evaluation, surveillance.  Patient politely declines GYN. She would like to speak with PCP 04/20/19 at f/u.  She will call us with any symptoms, concerns.

## 2019-04-10 ENCOUNTER — Telehealth: Payer: Self-pay | Admitting: Internal Medicine

## 2019-04-10 NOTE — Telephone Encounter (Signed)
[place] for orders  [employer]

## 2019-04-10 NOTE — Telephone Encounter (Signed)
Left detailed message on secure line  

## 2019-04-10 NOTE — Telephone Encounter (Signed)
[place] with [facility] [phone]  Verbal Orders  2x a week for 6 weeks Physical Therapy

## 2019-04-10 NOTE — Telephone Encounter (Signed)
Okay for orders? 

## 2019-04-11 DIAGNOSIS — I129 Hypertensive chronic kidney disease with stage 1 through stage 4 chronic kidney disease, or unspecified chronic kidney disease: Secondary | ICD-10-CM | POA: Diagnosis not present

## 2019-04-11 DIAGNOSIS — M8008XD Age-related osteoporosis with current pathological fracture, vertebra(e), subsequent encounter for fracture with routine healing: Secondary | ICD-10-CM | POA: Diagnosis not present

## 2019-04-11 DIAGNOSIS — F4329 Adjustment disorder with other symptoms: Secondary | ICD-10-CM | POA: Diagnosis not present

## 2019-04-11 DIAGNOSIS — F419 Anxiety disorder, unspecified: Secondary | ICD-10-CM | POA: Diagnosis not present

## 2019-04-11 DIAGNOSIS — N183 Chronic kidney disease, stage 3 unspecified: Secondary | ICD-10-CM | POA: Diagnosis not present

## 2019-04-11 DIAGNOSIS — F319 Bipolar disorder, unspecified: Secondary | ICD-10-CM | POA: Diagnosis not present

## 2019-04-11 DIAGNOSIS — M48061 Spinal stenosis, lumbar region without neurogenic claudication: Secondary | ICD-10-CM | POA: Diagnosis not present

## 2019-04-11 DIAGNOSIS — J449 Chronic obstructive pulmonary disease, unspecified: Secondary | ICD-10-CM | POA: Diagnosis not present

## 2019-04-11 DIAGNOSIS — S41111D Laceration without foreign body of right upper arm, subsequent encounter: Secondary | ICD-10-CM | POA: Diagnosis not present

## 2019-04-12 IMAGING — MG DIGITAL SCREENING BILATERAL MAMMOGRAM WITH TOMO AND CAD
8 series · 8 of 24 positions shown · non-contrast
Comparison: Previous exam(s).

CLINICAL DATA: Screening.

EXAM:
DIGITAL SCREENING BILATERAL MAMMOGRAM WITH TOMO AND CAD

[R MLO synth-2D]
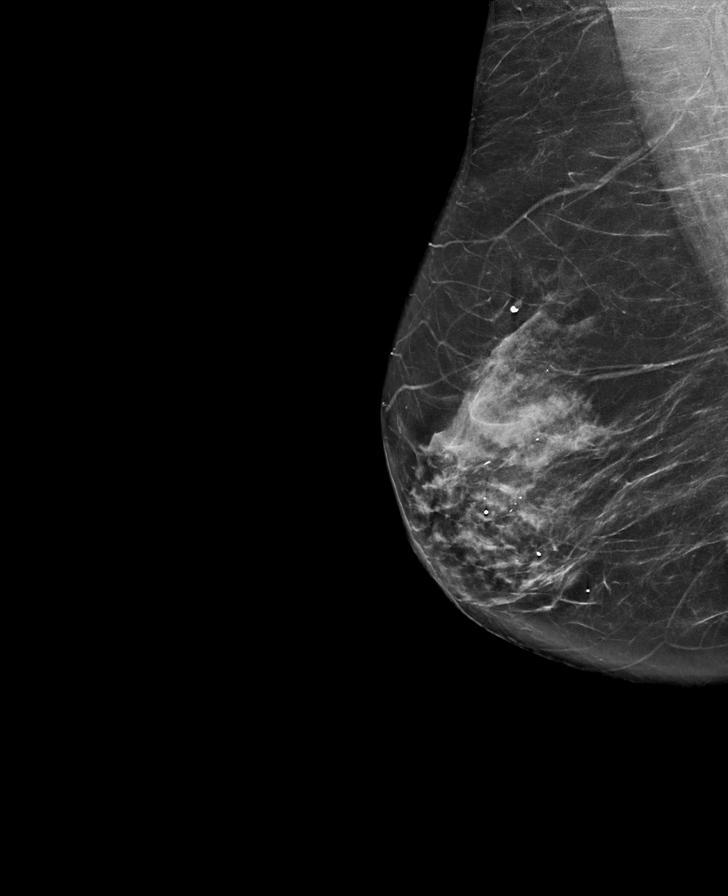

[L MLO synth-2D]
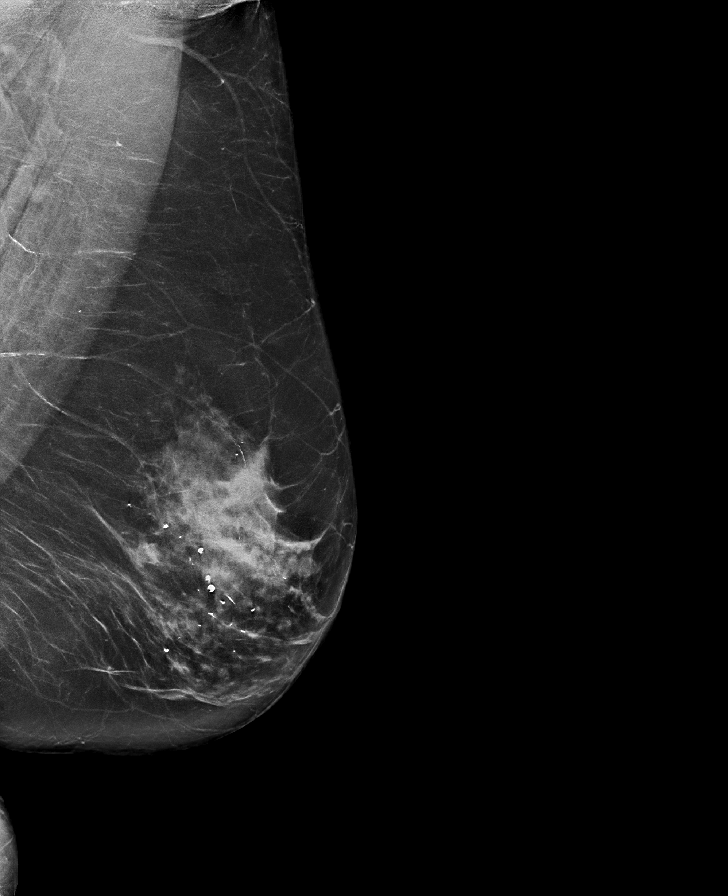

[R CC synth-2D]
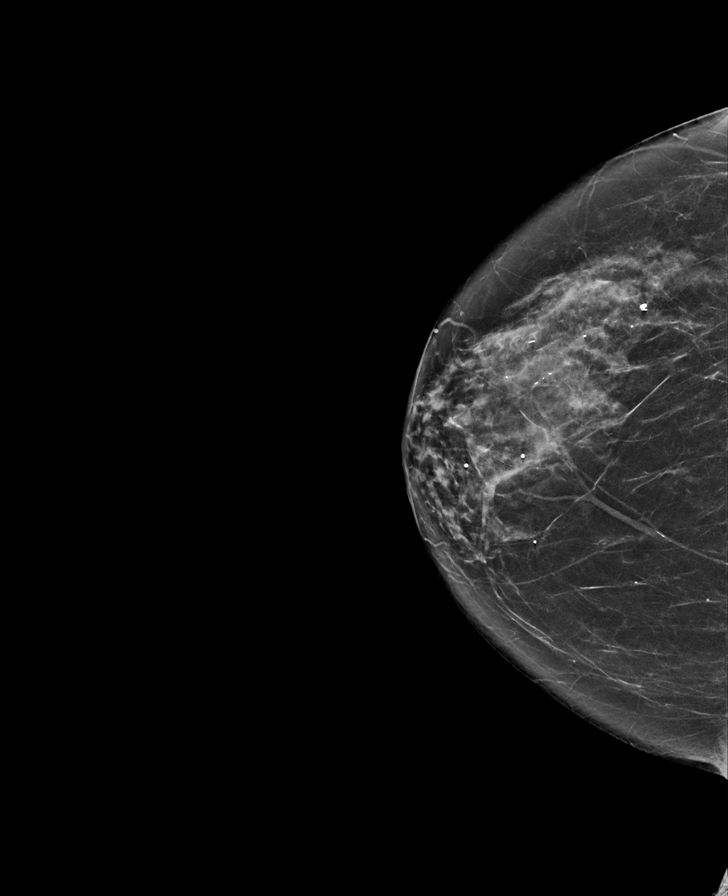

[L CC synth-2D]
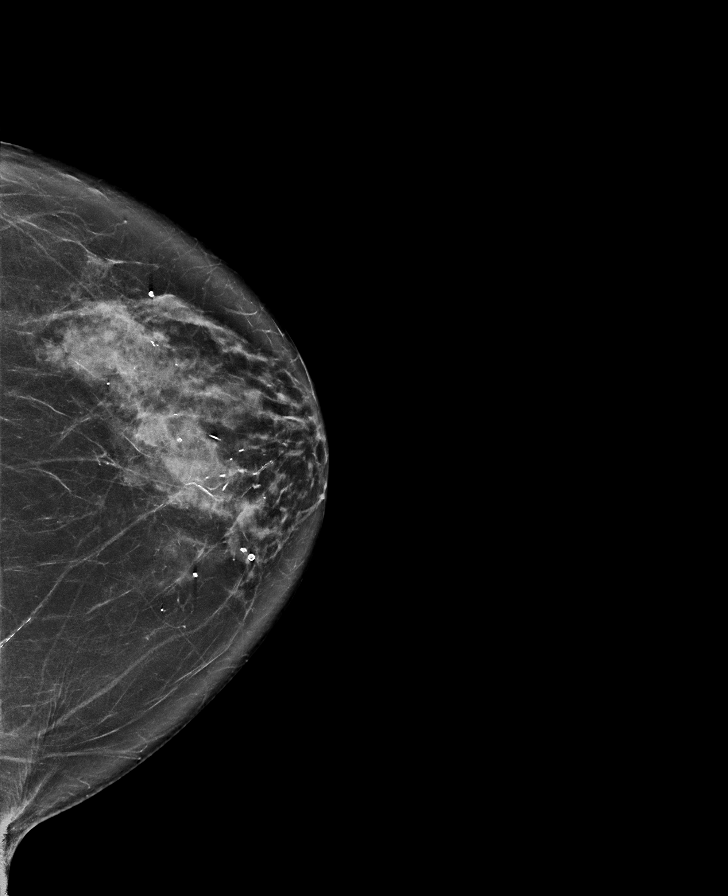

[R CC tomo · tomo slice 34/67.0]
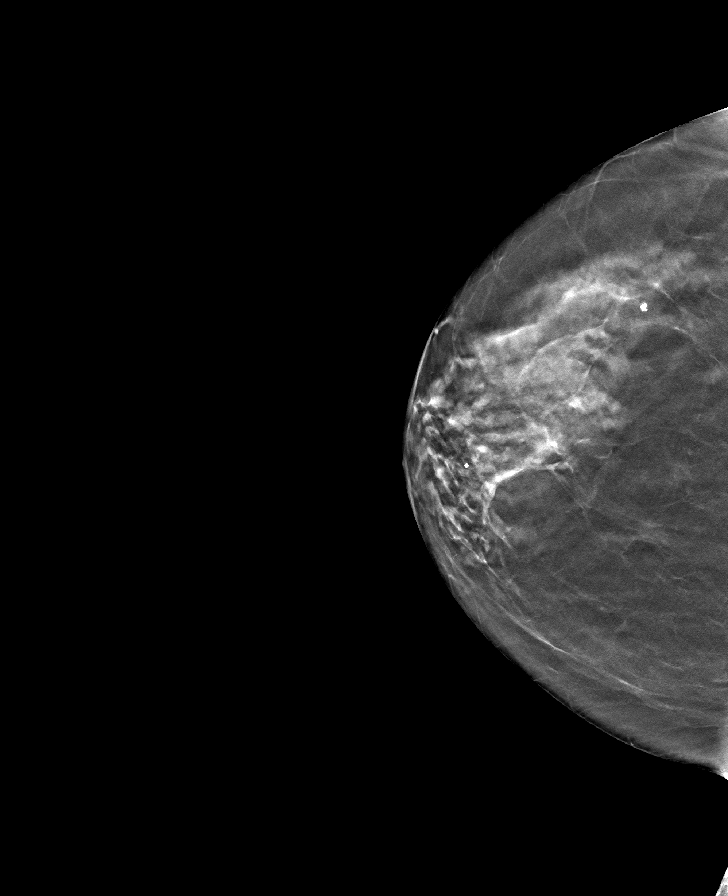

[R MLO tomo · tomo slice 38/75.0]
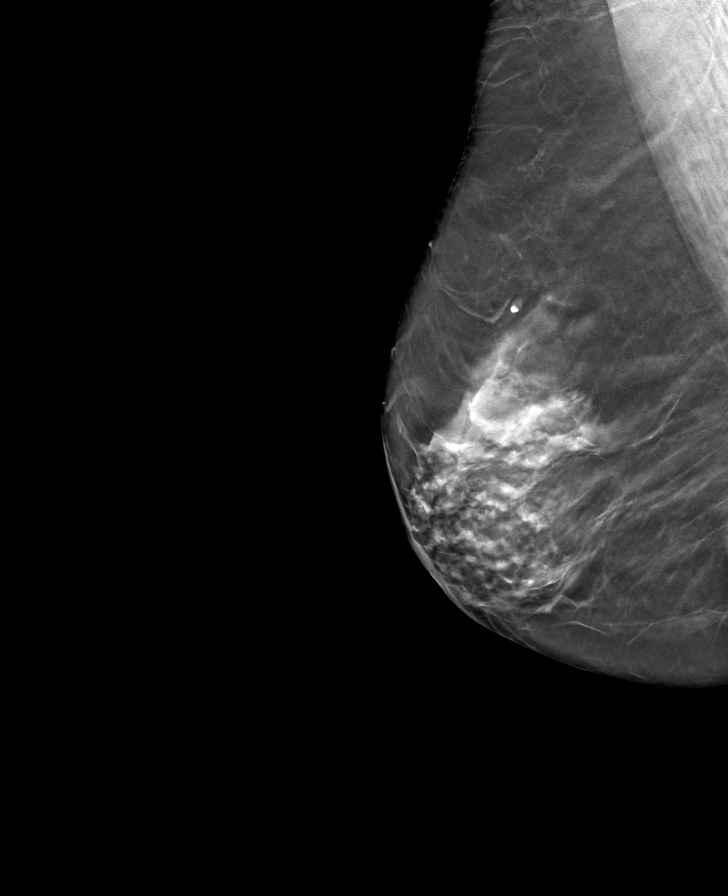

[L CC tomo · tomo slice 39/77.0]
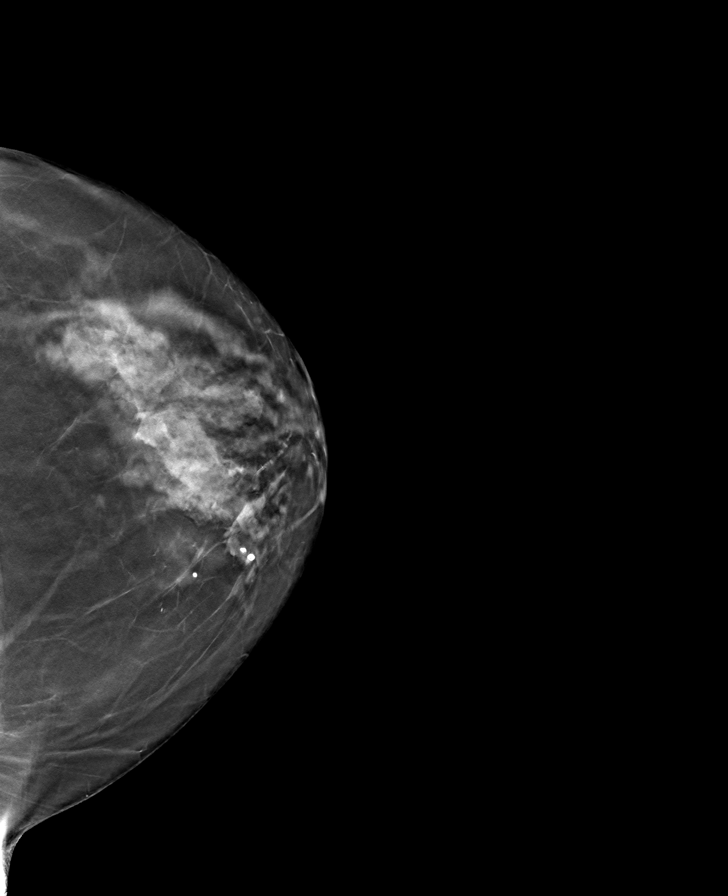

[L MLO tomo · tomo slice 43/85.0]
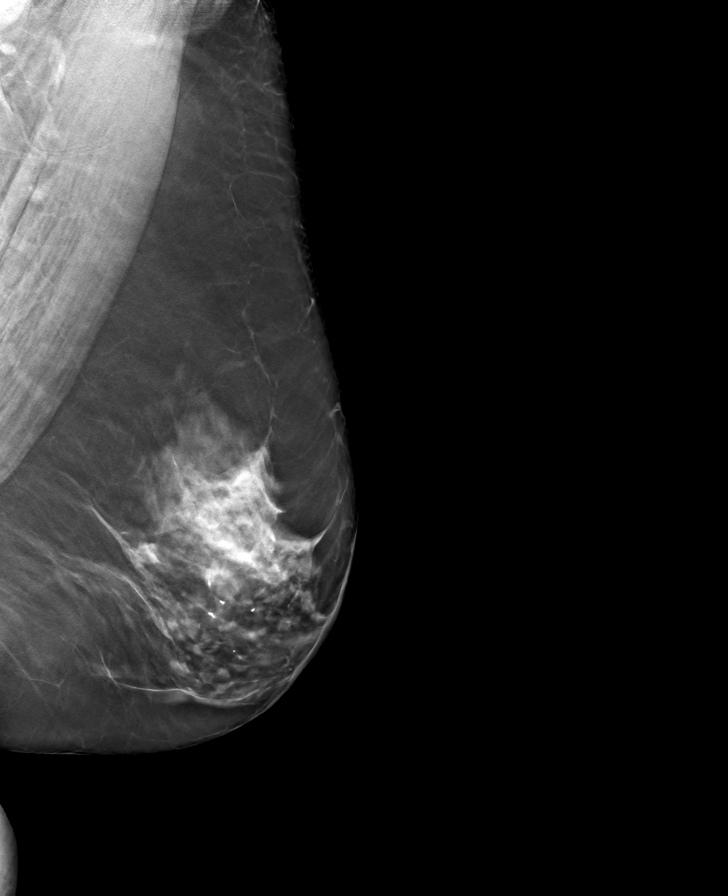

[8 of 24 positions shown; findings below may reference images not displayed]

ACR Breast Density Category c: The breast tissue is heterogeneously
dense, which may obscure small masses.
FINDINGS: In the left breast, a possible asymmetry warrants further
evaluation. In the right breast, no findings suspicious for
malignancy. Images were processed with CAD.
IMPRESSION: Further evaluation is suggested for possible asymmetry in the left
breast.

RECOMMENDATION:
Diagnostic mammogram and possibly ultrasound of the left breast.
(Code:[id number])

The patient will be contacted regarding the findings, and additional
imaging will be scheduled.

BI-RADS CATEGORY  0: Incomplete. Need additional imaging evaluation
and/or prior mammograms for comparison.

## 2019-04-13 DIAGNOSIS — N183 Chronic kidney disease, stage 3 unspecified: Secondary | ICD-10-CM | POA: Diagnosis not present

## 2019-04-13 DIAGNOSIS — I129 Hypertensive chronic kidney disease with stage 1 through stage 4 chronic kidney disease, or unspecified chronic kidney disease: Secondary | ICD-10-CM | POA: Diagnosis not present

## 2019-04-13 DIAGNOSIS — S41111D Laceration without foreign body of right upper arm, subsequent encounter: Secondary | ICD-10-CM | POA: Diagnosis not present

## 2019-04-13 DIAGNOSIS — J449 Chronic obstructive pulmonary disease, unspecified: Secondary | ICD-10-CM | POA: Diagnosis not present

## 2019-04-13 DIAGNOSIS — M8008XD Age-related osteoporosis with current pathological fracture, vertebra(e), subsequent encounter for fracture with routine healing: Secondary | ICD-10-CM | POA: Diagnosis not present

## 2019-04-13 DIAGNOSIS — F419 Anxiety disorder, unspecified: Secondary | ICD-10-CM | POA: Diagnosis not present

## 2019-04-13 DIAGNOSIS — M48061 Spinal stenosis, lumbar region without neurogenic claudication: Secondary | ICD-10-CM | POA: Diagnosis not present

## 2019-04-13 DIAGNOSIS — F4329 Adjustment disorder with other symptoms: Secondary | ICD-10-CM | POA: Diagnosis not present

## 2019-04-13 DIAGNOSIS — F319 Bipolar disorder, unspecified: Secondary | ICD-10-CM | POA: Diagnosis not present

## 2019-04-14 DIAGNOSIS — M8008XD Age-related osteoporosis with current pathological fracture, vertebra(e), subsequent encounter for fracture with routine healing: Secondary | ICD-10-CM | POA: Diagnosis not present

## 2019-04-14 DIAGNOSIS — M48061 Spinal stenosis, lumbar region without neurogenic claudication: Secondary | ICD-10-CM | POA: Diagnosis not present

## 2019-04-14 DIAGNOSIS — F419 Anxiety disorder, unspecified: Secondary | ICD-10-CM | POA: Diagnosis not present

## 2019-04-14 DIAGNOSIS — I129 Hypertensive chronic kidney disease with stage 1 through stage 4 chronic kidney disease, or unspecified chronic kidney disease: Secondary | ICD-10-CM | POA: Diagnosis not present

## 2019-04-14 DIAGNOSIS — J449 Chronic obstructive pulmonary disease, unspecified: Secondary | ICD-10-CM | POA: Diagnosis not present

## 2019-04-14 DIAGNOSIS — F319 Bipolar disorder, unspecified: Secondary | ICD-10-CM | POA: Diagnosis not present

## 2019-04-14 DIAGNOSIS — F4329 Adjustment disorder with other symptoms: Secondary | ICD-10-CM | POA: Diagnosis not present

## 2019-04-14 DIAGNOSIS — N183 Chronic kidney disease, stage 3 unspecified: Secondary | ICD-10-CM | POA: Diagnosis not present

## 2019-04-14 DIAGNOSIS — S41111D Laceration without foreign body of right upper arm, subsequent encounter: Secondary | ICD-10-CM | POA: Diagnosis not present

## 2019-04-17 DIAGNOSIS — M48061 Spinal stenosis, lumbar region without neurogenic claudication: Secondary | ICD-10-CM | POA: Diagnosis not present

## 2019-04-17 DIAGNOSIS — S41111D Laceration without foreign body of right upper arm, subsequent encounter: Secondary | ICD-10-CM | POA: Diagnosis not present

## 2019-04-17 DIAGNOSIS — F4329 Adjustment disorder with other symptoms: Secondary | ICD-10-CM | POA: Diagnosis not present

## 2019-04-17 DIAGNOSIS — F319 Bipolar disorder, unspecified: Secondary | ICD-10-CM | POA: Diagnosis not present

## 2019-04-17 DIAGNOSIS — F419 Anxiety disorder, unspecified: Secondary | ICD-10-CM | POA: Diagnosis not present

## 2019-04-17 DIAGNOSIS — I129 Hypertensive chronic kidney disease with stage 1 through stage 4 chronic kidney disease, or unspecified chronic kidney disease: Secondary | ICD-10-CM | POA: Diagnosis not present

## 2019-04-17 DIAGNOSIS — M8008XD Age-related osteoporosis with current pathological fracture, vertebra(e), subsequent encounter for fracture with routine healing: Secondary | ICD-10-CM | POA: Diagnosis not present

## 2019-04-17 DIAGNOSIS — N183 Chronic kidney disease, stage 3 unspecified: Secondary | ICD-10-CM | POA: Diagnosis not present

## 2019-04-17 DIAGNOSIS — J449 Chronic obstructive pulmonary disease, unspecified: Secondary | ICD-10-CM | POA: Diagnosis not present

## 2019-04-18 ENCOUNTER — Other Ambulatory Visit: Payer: Self-pay

## 2019-04-18 ENCOUNTER — Encounter: Payer: Self-pay | Admitting: Psychiatry

## 2019-04-18 ENCOUNTER — Ambulatory Visit (INDEPENDENT_AMBULATORY_CARE_PROVIDER_SITE_OTHER): Payer: Medicare PPO | Admitting: Psychiatry

## 2019-04-18 DIAGNOSIS — F39 Unspecified mood [affective] disorder: Secondary | ICD-10-CM | POA: Insufficient documentation

## 2019-04-18 DIAGNOSIS — M8008XD Age-related osteoporosis with current pathological fracture, vertebra(e), subsequent encounter for fracture with routine healing: Secondary | ICD-10-CM | POA: Diagnosis not present

## 2019-04-18 DIAGNOSIS — F4329 Adjustment disorder with other symptoms: Secondary | ICD-10-CM | POA: Diagnosis not present

## 2019-04-18 DIAGNOSIS — F419 Anxiety disorder, unspecified: Secondary | ICD-10-CM

## 2019-04-18 DIAGNOSIS — F3176 Bipolar disorder, in full remission, most recent episode depressed: Secondary | ICD-10-CM | POA: Diagnosis not present

## 2019-04-18 DIAGNOSIS — N183 Chronic kidney disease, stage 3 unspecified: Secondary | ICD-10-CM | POA: Diagnosis not present

## 2019-04-18 DIAGNOSIS — S41111D Laceration without foreign body of right upper arm, subsequent encounter: Secondary | ICD-10-CM | POA: Diagnosis not present

## 2019-04-18 DIAGNOSIS — M48061 Spinal stenosis, lumbar region without neurogenic claudication: Secondary | ICD-10-CM | POA: Diagnosis not present

## 2019-04-18 DIAGNOSIS — J449 Chronic obstructive pulmonary disease, unspecified: Secondary | ICD-10-CM | POA: Diagnosis not present

## 2019-04-18 DIAGNOSIS — I129 Hypertensive chronic kidney disease with stage 1 through stage 4 chronic kidney disease, or unspecified chronic kidney disease: Secondary | ICD-10-CM | POA: Diagnosis not present

## 2019-04-18 DIAGNOSIS — F319 Bipolar disorder, unspecified: Secondary | ICD-10-CM | POA: Diagnosis not present

## 2019-04-18 HISTORY — DX: Unspecified mood (affective) disorder: F39

## 2019-04-18 MED ORDER — ALPRAZOLAM 0.25 MG PO TABS: 0.2500 mg | ORAL_TABLET | Freq: Every evening | ORAL | 0 refills | Status: DC | PRN

## 2019-04-18 MED ORDER — LAMOTRIGINE 100 MG PO TABS: 100.0000 mg | ORAL_TABLET | Freq: Two times a day (BID) | ORAL | 0 refills | Status: DC

## 2019-04-18 MED ORDER — QUETIAPINE FUMARATE 25 MG PO TABS: 25.0000 mg | ORAL_TABLET | Freq: Every day | ORAL | 0 refills | Status: DC

## 2019-04-18 MED ORDER — ESCITALOPRAM OXALATE 10 MG PO TABS: 10.0000 mg | ORAL_TABLET | Freq: Every day | ORAL | 0 refills | Status: DC

## 2019-04-18 NOTE — Progress Notes (Signed)
[facility] MD OP Progress Note  I connected with  Kerri Carter on 04/18/19 by a video enabled telemedicine application and verified that I am speaking with the correct person using two identifiers.   I discussed the limitations of evaluation and management by telemedicine. The patient expressed understanding and agreed to proceed.     04/18/2019 9:12 AM Kerri [place]  MRN:  [id number]  Chief Complaint:  " I am doing better."  HPI: Patient lost her husband of 32 years in January 2021 due to [id number] related complications.  She was also sick with COVID-19 at that time.  A few weeks after her husband's demise patient was not doing well in terms of her mood as well as appeared to have hallucinations.  Her PCP had reached out to the writer's office for an early appointment.  The office had contacted her granddaughter in law however as patient had started to do better at that time appointment was not preformed and today's appointment which was the original time was maintained. Today, patient was noted to be alert and oriented to time place and person.  She was calm and composed.  She made good eye contact during the conversation.  She stated that she did have a few rough weeks however she is doing much better now.  She stated that she misses her husband and still has a hard time believing that he is gone.  She stated that sometimes she feels he still sleeping with her in the same bed like he used to.  She stated that her sleep has improved now as she has started taking melatonin but the Seroquel and that has been helpful.  She feels her mood is stable and she is not as tearful as she was. She informed that now she mobilizes with the help of a walker due to all the frequent falls she was having last year.  She stated that she has not been falling as much as she was.  She did complain that she did slip down the stairs and hit her back a few days ago and has to go and see her back doctor for the same.  Her  granddaughter in law Ms. Morey Hummingbird was beside her during the session and informed that patient has returned back to her baseline and is doing much better now. Both patient and Ms. Morey Hummingbird requested for the patient to be continue the same regimen as she has been taking it regularly which has helped her immensely.  Visit Diagnosis:    ICD-10-CM   1. Bipolar disorder, in full remission, most recent episode depressed (HCC)  F31.76 lamoTRIgine (LAMICTAL) 100 MG tablet    QUEtiapine (SEROQUEL) 25 MG tablet  2. Anxiety  F41.9 lamoTRIgine (LAMICTAL) 100 MG tablet    QUEtiapine (SEROQUEL) 25 MG tablet    escitalopram (LEXAPRO) 10 MG tablet    ALPRAZolam (XANAX) 0.25 MG tablet    Past Psychiatric History: Bipolar d/o, anxiety  Past Medical History:  Past Medical History:  Diagnosis Date  . Anxiety   . Asthma   . Bipolar disorder ([place])   . Cataract    s/p b/l repair   . Chicken pox   . CKD (chronic kidney disease)   . CKD (chronic kidney disease), stage III    a. s/p R nephrectomy.  . Conversion disorder   . COPD (chronic obstructive pulmonary disease) ([place])   . Depression   . Essential hypertension   . GERD (gastroesophageal reflux disease)   . Hyperlipidemia   .  Inflammatory arthritis    a. hands/carpal tunnel.  b. Low titer rheumatoid factor. c. Negative anti-CCP antibodies. d. Plaquenil.  . Non-Obstructive CAD    a. 07/2009 Cath ([facility]): nonobs dzs;  b. 03/2011 Cath [facility]): nonobs dzs.  . Osteoarthritis    a. Knees.  . PUD (peptic ulcer disease)   . S/P right hip fracture    11/01/16 s/p repair  . Shoulder pain   . Sleep apnea    no cpap  . Spinal stenosis at L4-L5 level    severe with L4/L5 anterolisthesis grade 1 anterolisthesis   . Toxic maculopathy   . Valvular heart disease    a. 07/2015 Echo: EF 55-60%, Mild AI, AS, MR, and TR.    Past Surgical History:  Procedure Laterality Date  . APPENDECTOMY    . BACK SURGERY    . BUNIONECTOMY Right   . CATARACT EXTRACTION, BILATERAL     . CESAREAN SECTION     x1  . CHOLECYSTECTOMY N/A 05/11/2016   Procedure: LAPAROSCOPIC CHOLECYSTECTOMY;  Surgeon: Florene Glen, MD;  Location: [facility] ORS;  Service: General;  Laterality: N/A;  . COLONOSCOPY WITH PROPOFOL N/A 04/02/2016   Procedure: COLONOSCOPY WITH PROPOFOL;  Surgeon: Jonathon Bellows, MD;  Location: [facility] ENDOSCOPY;  Service: Endoscopy;  Laterality: N/A;  . ENDOSCOPIC RETROGRADE CHOLANGIOPANCREATOGRAPHY (ERCP) WITH PROPOFOL N/A 05/08/2016   Procedure: ENDOSCOPIC RETROGRADE CHOLANGIOPANCREATOGRAPHY (ERCP) WITH PROPOFOL;  Surgeon: Lucilla Lame, MD;  Location: [facility] ENDOSCOPY;  Service: Endoscopy;  Laterality: N/A;  . ERCP     with biliary spincterotomy 05/08/16 Dr. Allen Norris for choledocholithiasis   . ESOPHAGEAL DILATION  04/02/2016   Procedure: ESOPHAGEAL DILATION;  Surgeon: Jonathon Bellows, MD;  Location: [facility] ENDOSCOPY;  Service: Endoscopy;;  . ESOPHAGOGASTRODUODENOSCOPY (EGD) WITH PROPOFOL N/A 04/02/2016   Procedure: ESOPHAGOGASTRODUODENOSCOPY (EGD) WITH PROPOFOL;  Surgeon: Jonathon Bellows, MD;  Location: [facility] ENDOSCOPY;  Service: Endoscopy;  Laterality: N/A;  . HIP ARTHROPLASTY Right 11/01/2016   Procedure: ARTHROPLASTY BIPOLAR HIP (HEMIARTHROPLASTY);  Surgeon: Corky Mull, MD;  Location: [facility] ORS;  Service: Orthopedics;  Laterality: Right;  . 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 osteoporosis     noted DEXA 08/19/16   . REPLACEMENT TOTAL KNEE Right   . REVERSE SHOULDER ARTHROPLASTY Right 11/04/2017   Procedure: REVERSE SHOULDER ARTHROPLASTY;  Surgeon: Corky Mull, MD;  Location: [facility] ORS;  Service: Orthopedics;  Laterality: Right;  . REVERSE SHOULDER ARTHROPLASTY Left 07/26/2018   Procedure: REVERSE SHOULDER ARTHROPLASTY;  Surgeon: Corky Mull, MD;  Location: [facility] ORS;  Service: Orthopedics;  Laterality: Left;  . TONSILLECTOMY    . TOTAL HIP ARTHROPLASTY  12/10/11   [facility] left hip  . TOTAL HIP ARTHROPLASTY Bilateral   . TUBAL LIGATION       Family Psychiatric History: see below  Family History:  Family History  Problem Relation Age of Onset  . Rheum arthritis Mother   . Asthma Mother   . Parkinson's disease Mother   . Heart disease Mother   . Stroke Mother   . Hypertension Mother   . Heart attack Father   . Heart disease Father   . Hypertension Father   . Diabetes Son   . Gout Son   . Asthma Sister   . Heart disease Sister   . Lung cancer Sister   . Heart disease Sister   . Heart disease Sister   . Breast cancer Sister   . Heart attack Sister   . Heart  disease Brother   . Heart disease Maternal Grandmother   . Diabetes Maternal Grandmother   . Colon cancer Maternal Grandmother   . Cancer Maternal Grandmother        Hodgkins lymphoma  . Heart disease Brother   . Alcohol abuse Brother   . Depression Brother   . Dementia Son     Social History:  Social History   Socioeconomic History  . Marital status: Widowed    Spouse name: richard  . Number of children: 2  . Years of education: Some Coll  . Highest education level: Some college, no degree  Occupational History  . Occupation: Retired    Comment: retired  Tobacco Use  . Smoking status: Former Smoker    Packs/day: 0.50    Years: 20.00    Pack years: 10.00    Types: Cigarettes    Quit date: 02/02/1974    Years since quitting: 45.2  . Smokeless tobacco: Never Used  Substance and Sexual Activity  . Alcohol use: No  . Drug use: No  . Sexual activity: Not Currently  Other Topics Concern  . Not on file  Social History Narrative   Lives at home in [place] grandson, his wife and their child.   Husband Fannie Cillo died covid 59 late 1/2043married x 58 years   Right-handed.   6 cups coffee per day.   Social Determinants of Health   Financial Resource Strain: Low Risk   . Difficulty of Paying Living Expenses: Not hard at all  Food Insecurity: No Food Insecurity  . Worried About Charity fundraiser in the Last Year: Never true  . Ran  Out of Food in the Last Year: Never true  Transportation Needs:   . Lack of Transportation (Medical):   Marland Kitchen Lack of Transportation (Non-Medical):   Physical Activity:   . Days of Exercise per Week:   . Minutes of Exercise per Session:   Stress:   . Feeling of Stress :   Social Connections:   . Frequency of Communication with Friends and Family:   . Frequency of Social Gatherings with Friends and Family:   . Attends Religious Services:   . Active Member of Clubs or Organizations:   . Attends Archivist Meetings:   Marland Kitchen Marital Status:     Allergies:  Allergies  Allergen Reactions  . Ceftin [Cefuroxime Axetil] Anaphylaxis  . Lisinopril Anaphylaxis  . Morphine Other (See Comments)    Per patient, low blood pressure issues that requires action to raise it back up. Can take small infrequent doses  . Sulfasalazine Anaphylaxis  . Aspirin Other (See Comments)    Sulfasalazine allergy cross reacts  . Antihistamines, Chlorpheniramine-Type Other (See Comments)    Makes pt hyper  . Antivert [Meclizine Hcl] Other (See Comments)    Bladder will not empty  . Decongestant [Pseudoephedrine Hcl] Other (See Comments)    Makes pt hyper  . Doxycycline Other (See Comments)    GI upset  . Polymyxin B Other (See Comments)    Medication was in eye drops.  . Sulfa Antibiotics Other (See Comments)    Face swelling  . Xarelto [Rivaroxaban] Other (See Comments)    Stomach burning, bleeding, and tar in stool  . Adhesive [Tape] Rash  . Iodine Hives and Rash    Per patient allergy is to contrast dye only, she is able to use betadine scrubs.  [place] [Levofloxacin In D5w] Rash  . Tetanus Toxoids Rash and Other (See Comments)  Fever and hot to touch at injection site    Metabolic Disorder Labs: Lab Results  Component Value Date   HGBA1C 6.1 (H) 12/01/2018   MPG 128.37 12/01/2018   MPG 114 08/06/2017   No results found for: PROLACTIN Lab Results  Component Value Date   CHOL 147  12/01/2018   TRIG 227 (H) 12/01/2018   HDL 42 12/01/2018   CHOLHDL 3.5 12/01/2018   VLDL 45 (H) 12/01/2018   LDLCALC 60 12/01/2018   LDLCALC 86 08/06/2017   Lab Results  Component Value Date   TSH 1.734 12/01/2018   TSH 2.72 08/06/2017    Therapeutic Level Labs: No results found for: LITHIUM No results found for: VALPROATE No components found for:  CBMZ  Current Medications: Current Outpatient Medications  Medication Sig Dispense Refill  . Acetylcysteine 600 MG CAPS Take 1 capsule (600 mg total) by mouth 2 (two) times daily with a meal. 60 capsule 2  . Adalimumab (HUMIRA PEN) 40 MG/0.4ML PNKT Inject 40 mg into the skin every 14 (fourteen) days.     Marland Kitchen albuterol (VENTOLIN HFA) 108 (90 Base) MCG/ACT inhaler Inhale 2 puffs into the lungs every 6 (six) hours as needed. 1 Inhaler 5  . ALPRAZolam (XANAX) 0.25 MG tablet Take 1 tablet (0.25 mg total) by mouth at bedtime as needed for anxiety. 90 tablet 0  . budesonide (PULMICORT) 0.25 MG/2ML nebulizer solution Take 2 mLs (0.25 mg total) by nebulization 2 (two) times daily. 150 mL 11  . BYSTOLIC 10 MG tablet TAKE 1 TABLET BY MOUTH EVERY DAY 30 tablet 2  . dicyclomine (BENTYL) 10 MG capsule TAKE 1 CAPSULE (10 MG TOTAL) BY MOUTH 4 (FOUR) TIMES DAILY - BEFORE MEALS AND AT BEDTIME. 360 capsule 3  . escitalopram (LEXAPRO) 10 MG tablet Take 1 tablet (10 mg total) by mouth daily. 90 tablet 0  . famotidine (PEPCID) 20 MG tablet Take 1 tablet (20 mg total) by mouth daily. Before breakfast or dinner 90 tablet 3  . fluticasone (FLONASE) 50 MCG/ACT nasal spray Place 1 spray into both nostrils 2 (two) times daily. 16 g 0  . fluticasone furoate-vilanterol (BREO ELLIPTA) 200-25 MCG/INH AEPB Inhale 1 puff into the lungs daily. 28 each 11  . gabapentin (NEURONTIN) 300 MG capsule Take 2-3 capsules (600-900 mg total) by mouth See admin instructions. Take 600 mg by mouth in the morning and 900 mg at bedtime 450 capsule 1  . ipratropium-albuterol (DUONEB) 0.5-2.5  (3) MG/3ML SOLN Take 3 mLs by nebulization every 8 (eight) hours as needed. 360 mL 12  . lamoTRIgine (LAMICTAL) 100 MG tablet Take 1 tablet (100 mg total) by mouth 2 (two) times daily. 180 tablet 0  . leflunomide (ARAVA) 20 MG tablet Take 1 tablet (20 mg total) by mouth daily.    Marland Kitchen loperamide (IMODIUM A-D) 2 MG tablet Take 1 tablet (2 mg total) by mouth 4 (four) times daily as needed for diarrhea or loose stools. 30 tablet 0  . lovastatin (MEVACOR) 40 MG tablet Take 1 tablet (40 mg total) by mouth at bedtime. 90 tablet 3  . mirabegron ER (MYRBETRIQ) 50 MG TB24 tablet Take 1 tablet (50 mg total) by mouth at bedtime. 30 tablet 5  . montelukast (SINGULAIR) 10 MG tablet Take 1 tablet (10 mg total) by mouth daily. 90 tablet 3  . multivitamin-lutein (OCUVITE-LUTEIN) CAPS capsule Take 1 capsule by mouth 2 (two) times daily.    . mupirocin ointment (BACTROBAN) 2 % Apply 1 application topically 2 (two) times  daily. X 1 week (Patient not taking: Reported on 03/16/2019) 30 g 0  . ondansetron (ZOFRAN) 4 MG tablet Take 1 tablet (4 mg total) by mouth every 8 (eight) hours as needed for nausea or vomiting. (Patient not taking: Reported on 03/16/2019) 30 tablet 0  . oxyCODONE-acetaminophen (PERCOCET) 5-325 MG tablet Take 1 tablet by mouth 2 (two) times daily as needed for severe pain. 30 tablet 0  . pantoprazole (PROTONIX) 40 MG tablet Take 1 tablet (40 mg total) by mouth 2 (two) times daily. 30 minutes before food. Note reduction in frequency 180 tablet 3  . predniSONE (DELTASONE) 10 MG tablet 2 tablets (20MG ) for one week, then decrease to 10mg  daily until f/u. 45 tablet 0  . QUEtiapine (SEROQUEL) 25 MG tablet Take 1 tablet (25 mg total) by mouth at bedtime. 90 tablet 0  . sucralfate (CARAFATE) 1 g tablet Take 1 tablet (1 g total) by mouth 4 (four) times daily. TAKE 1 TABLET BY MOUTH 4 TIMES A DAY WITH MEALS AND AT BEDTIME 360 tablet 3   No current facility-administered medications for this visit.      Psychiatric Specialty Exam: Review of Systems  There were no vitals taken for this visit.There is no height or weight on file to calculate BMI.  General Appearance: Fairly groomed, appears to be well taken for  Eye Contact:  Good  Speech:  Clear and Coherent and Normal Rate  Volume:  Normal  Mood:  Euthymic  Affect:  Congruent  Thought Process:  Goal Directed, Linear and Descriptions of Associations: Intact  Orientation:  Full (Time, Place, and Person)  Thought Content: Logical   Suicidal Thoughts:  No  Homicidal Thoughts:  No  Memory:  Recent;   Good Remote;   Good  Judgement:  Fair  Insight:  Fair  Psychomotor Activity:  Normal  Concentration:  Concentration: Good and Attention Span: Good  Recall:  Good  Fund of Knowledge: Good  Language: Good  Akathisia:  Negative  Handed:  Right  AIMS (if indicated): not done   Assets:  Communication Skills Desire for Improvement Financial Resources/Insurance Housing Social Support  ADL's:  Intact  Cognition: WNL  Sleep:  Good   Screenings: GAD-7     Office Visit from 09/05/2015 in [facility]  Total GAD-7 Score  18    Mini-Mental     Clinical Support from 03/01/2017 in [place] Office Visit from 02/28/2016 in [facility] from 07/11/2015 in [facility]  Total Score (max 30 points )  30  30  27     PHQ2-9     Office Visit from 03/16/2019 in [facility] from 11/23/2018 in [facility] Office Visit from 07/07/2018 in [place] Office Visit from 02/24/2018 in [place] Office Visit from 10/19/2017 in [facility]  PHQ-2 Total Score  4  0  0  0  0  PHQ-9 Total Score  16  --  --  --  --       Assessment and Plan: 79 year old female with history of depression anxiety and multiple medical comorbidities now seen for  follow-up.  She lost her husband about 2 months ago and was not doing well a few weeks after his demise.  She was also recovering from COVID-19 infection.  Patient appears to be doing better now.  Her granddaughter in law who is her caretaker informed that patient seems  to be back to her baseline. Patient has been on Xanax 0.25 mg at bedtime as needed for anxiety from past many years and given her current state continuing it has more benefits than risks associated with it.  We will continue same regimen for now.  1. Bipolar disorder, in full remission, most recent episode depressed (HCC)  - lamoTRIgine (LAMICTAL) 100 MG tablet; Take 1 tablet (100 mg total) by mouth 2 (two) times daily.  Dispense: 180 tablet; Refill: 0 - QUEtiapine (SEROQUEL) 25 MG tablet; Take 1 tablet (25 mg total) by mouth at bedtime.  Dispense: 90 tablet; Refill: 0 - escitalopram (LEXAPRO) 10 MG tablet; Take 1 tablet (10 mg total) by mouth daily.  Dispense: 90 tablet; Refill: 0  2. Anxiety  - ALPRAZolam (XANAX) 0.25 MG tablet; Take 1 tablet (0.25 mg total) by mouth at bedtime as needed for anxiety.  Dispense: 90 tablet; Refill: 0  Continue same regimen for now. F/up in 3 months.  [place] Crane, MD 04/18/2019, 9:12 AM

## 2019-04-20 ENCOUNTER — Encounter: Payer: Self-pay | Admitting: Internal Medicine

## 2019-04-20 ENCOUNTER — Telehealth (INDEPENDENT_AMBULATORY_CARE_PROVIDER_SITE_OTHER): Payer: Medicare PPO | Admitting: Internal Medicine

## 2019-04-20 VITALS — BP 132/68 | Ht 61.0 in | Wt 167.0 lb

## 2019-04-20 DIAGNOSIS — M48061 Spinal stenosis, lumbar region without neurogenic claudication: Secondary | ICD-10-CM | POA: Diagnosis not present

## 2019-04-20 DIAGNOSIS — J849 Interstitial pulmonary disease, unspecified: Secondary | ICD-10-CM | POA: Diagnosis not present

## 2019-04-20 DIAGNOSIS — S41111D Laceration without foreign body of right upper arm, subsequent encounter: Secondary | ICD-10-CM | POA: Diagnosis not present

## 2019-04-20 DIAGNOSIS — F4329 Adjustment disorder with other symptoms: Secondary | ICD-10-CM | POA: Diagnosis not present

## 2019-04-20 DIAGNOSIS — N83202 Unspecified ovarian cyst, left side: Secondary | ICD-10-CM

## 2019-04-20 DIAGNOSIS — R600 Localized edema: Secondary | ICD-10-CM

## 2019-04-20 DIAGNOSIS — F319 Bipolar disorder, unspecified: Secondary | ICD-10-CM | POA: Diagnosis not present

## 2019-04-20 DIAGNOSIS — L97909 Non-pressure chronic ulcer of unspecified part of unspecified lower leg with unspecified severity: Secondary | ICD-10-CM

## 2019-04-20 DIAGNOSIS — N1831 Chronic kidney disease, stage 3a: Secondary | ICD-10-CM | POA: Diagnosis not present

## 2019-04-20 DIAGNOSIS — F419 Anxiety disorder, unspecified: Secondary | ICD-10-CM | POA: Diagnosis not present

## 2019-04-20 DIAGNOSIS — M79671 Pain in right foot: Secondary | ICD-10-CM

## 2019-04-20 DIAGNOSIS — R5383 Other fatigue: Secondary | ICD-10-CM | POA: Diagnosis not present

## 2019-04-20 DIAGNOSIS — M8008XD Age-related osteoporosis with current pathological fracture, vertebra(e), subsequent encounter for fracture with routine healing: Secondary | ICD-10-CM | POA: Diagnosis not present

## 2019-04-20 DIAGNOSIS — M79672 Pain in left foot: Secondary | ICD-10-CM | POA: Diagnosis not present

## 2019-04-20 DIAGNOSIS — N183 Chronic kidney disease, stage 3 unspecified: Secondary | ICD-10-CM | POA: Diagnosis not present

## 2019-04-20 DIAGNOSIS — J449 Chronic obstructive pulmonary disease, unspecified: Secondary | ICD-10-CM | POA: Diagnosis not present

## 2019-04-20 DIAGNOSIS — I739 Peripheral vascular disease, unspecified: Secondary | ICD-10-CM | POA: Diagnosis not present

## 2019-04-20 DIAGNOSIS — I129 Hypertensive chronic kidney disease with stage 1 through stage 4 chronic kidney disease, or unspecified chronic kidney disease: Secondary | ICD-10-CM | POA: Diagnosis not present

## 2019-04-20 NOTE — Progress Notes (Signed)
Virtual Visit via Video Note  I connected with Kerri Carter on 04/20/19 at  2:45 PM EDT by a video enabled telemedicine application and verified that I am speaking with the correct person using two identifiers.  Location patient: home Location provider:work or home office Persons participating in the virtual visit: patient, provider  I discussed the limitations of evaluation and management by telemedicine and the availability of in person appointments. The patient expressed understanding and agreed to proceed.   HPI: 1. C/o  Leg edema to to point where shoes do not fit she is not taking lasix due to it not helping and having bladder incontinence with it. She is having to wear house shoes.   2. C/o lack of energy and fatigue since husbands death in Mar 05, 2019 with h/o conversion d/o will notify psych Dr. Carole Civil F/u rheumatology for labs last week of 04/2019 Dr. Jefm Bryant   3. C/o sob with 2x had breathing tx with h/o RA and NSIP. Also h/o CAD and f/u with [facility]  3. Left ovarian cyst Ovarian cyst increased in size agreeable to see ob/gyn 10/2019 Dr. Leonides Schanz  4. Persistent leukocytosis will consider h/o in future  5. CKD 3a Cr 1.48 GFR 34 will refer to Dr. Candiss Norse   ROS: See pertinent positives and negatives per HPI. General: wt loss, fatigue, lack of energy  Past Medical History:  Diagnosis Date  . Anxiety   . Asthma   . Bipolar disorder ([place])   . Cataract    s/p b/l repair   . Chicken pox   . CKD (chronic kidney disease)   . CKD (chronic kidney disease), stage III    a. s/p R nephrectomy.  . Conversion disorder   . COPD (chronic obstructive pulmonary disease) ([place])   . Depression   . Essential hypertension   . GERD (gastroesophageal reflux disease)   . Hyperlipidemia   . Inflammatory arthritis    a. hands/carpal tunnel.  b. Low titer rheumatoid factor. c. Negative anti-CCP antibodies. d. Plaquenil.  . Non-Obstructive CAD    a. 07/2009 Cath ([facility]): nonobs dzs;  b. 03/2011 Cath  [facility]): nonobs dzs.  . Osteoarthritis    a. Knees.  . PUD (peptic ulcer disease)   . S/P right hip fracture    11/01/16 s/p repair  . Shoulder pain   . Sleep apnea    no cpap  . Spinal stenosis at L4-L5 level    severe with L4/L5 anterolisthesis grade 1 anterolisthesis   . Toxic maculopathy   . Valvular heart disease    a. 07/2015 Echo: EF 55-60%, Mild AI, AS, MR, and TR.    Past Surgical History:  Procedure Laterality Date  . APPENDECTOMY    . BACK SURGERY    . BUNIONECTOMY Right   . CATARACT EXTRACTION, BILATERAL    . CESAREAN SECTION     x1  . CHOLECYSTECTOMY N/A 05/11/2016   Procedure: LAPAROSCOPIC CHOLECYSTECTOMY;  Surgeon: Florene Glen, MD;  Location: [facility] ORS;  Service: General;  Laterality: N/A;  . COLONOSCOPY WITH PROPOFOL N/A 04/02/2016   Procedure: COLONOSCOPY WITH PROPOFOL;  Surgeon: Jonathon Bellows, MD;  Location: [facility] ENDOSCOPY;  Service: Endoscopy;  Laterality: N/A;  . ENDOSCOPIC RETROGRADE CHOLANGIOPANCREATOGRAPHY (ERCP) WITH PROPOFOL N/A 05/08/2016   Procedure: ENDOSCOPIC RETROGRADE CHOLANGIOPANCREATOGRAPHY (ERCP) WITH PROPOFOL;  Surgeon: Lucilla Lame, MD;  Location: [facility] ENDOSCOPY;  Service: Endoscopy;  Laterality: N/A;  . ERCP     with biliary spincterotomy 05/08/16 Dr. Allen Norris for choledocholithiasis   . ESOPHAGEAL DILATION  04/02/2016   Procedure: ESOPHAGEAL DILATION;  Surgeon: Jonathon Bellows, MD;  Location: [facility] ENDOSCOPY;  Service: Endoscopy;;  . ESOPHAGOGASTRODUODENOSCOPY (EGD) WITH PROPOFOL N/A 04/02/2016   Procedure: ESOPHAGOGASTRODUODENOSCOPY (EGD) WITH PROPOFOL;  Surgeon: Jonathon Bellows, MD;  Location: [facility] ENDOSCOPY;  Service: Endoscopy;  Laterality: N/A;  . HIP ARTHROPLASTY Right 11/01/2016   Procedure: ARTHROPLASTY BIPOLAR HIP (HEMIARTHROPLASTY);  Surgeon: Corky Mull, MD;  Location: [facility] ORS;  Service: Orthopedics;  Laterality: Right;  . 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 osteoporosis     noted DEXA 08/19/16   .  REPLACEMENT TOTAL KNEE Right   . REVERSE SHOULDER ARTHROPLASTY Right 11/04/2017   Procedure: REVERSE SHOULDER ARTHROPLASTY;  Surgeon: Corky Mull, MD;  Location: [facility] ORS;  Service: Orthopedics;  Laterality: Right;  . REVERSE SHOULDER ARTHROPLASTY Left 07/26/2018   Procedure: REVERSE SHOULDER ARTHROPLASTY;  Surgeon: Corky Mull, MD;  Location: [facility] OR[facility];  Service: Orthopedics;  Laterality: Left;  . TONSILLECTOMY    . TOTAL HIP ARTHROPLASTY  12/10/11   [facility] left hip  . TOTAL HIP ARTHROPLASTY Bilateral   . TUBAL LIGATION      Family History  Problem Relation Age of Onset  . Rheum arthritis Mother   . Asthma Mother   . Parkinson's disease Mother   . Heart disease Mother   . Stroke Mother   . Hypertension Mother   . Heart attack Father   . Heart disease Father   . Hypertension Father   . Peripheral Artery Disease Father   . Diabetes Son   . Gout Son   . Asthma Sister   . Heart disease Sister   . Lung cancer Sister   . Heart disease Sister   . Heart disease Sister   . Breast cancer Sister   . Heart attack Sister   . Heart disease Brother   . Heart disease Maternal Grandmother   . Diabetes Maternal Grandmother   . Colon cancer Maternal Grandmother   . Cancer Maternal Grandmother        Hodgkins lymphoma  . Heart disease Brother   . Alcohol abuse Brother   . Depression Brother   . Dementia Son     SOCIAL HX: lives at home with grandaughter and grandson and great grand   Current Outpatient Medications:  .  Acetylcysteine 600 MG CAPS, Take 1 capsule (600 mg total) by mouth 2 (two) times daily with a meal., Disp: 60 capsule, Rfl: 2 .  Adalimumab (HUMIRA PEN) 40 MG/0.4ML PNKT, Inject 40 mg into the skin every 14 (fourteen) days. , Disp: , Rfl:  .  albuterol (VENTOLIN HFA) 108 (90 Base) MCG/ACT inhaler, Inhale 2 puffs into the lungs every 6 (six) hours as needed., Disp: 1 Inhaler, Rfl: 5 .  ALPRAZolam (XANAX) 0.25 MG tablet, Take 1 tablet (0.25 mg total) by mouth at bedtime as  needed for anxiety., Disp: 90 tablet, Rfl: 0 .  budesonide (PULMICORT) 0.25 MG/2ML nebulizer solution, Take 2 mLs (0.25 mg total) by nebulization 2 (two) times daily., Disp: 150 mL, Rfl: 11 .  BYSTOLIC 10 MG tablet, TAKE 1 TABLET BY MOUTH EVERY DAY, Disp: 30 tablet, Rfl: 2 .  dicyclomine (BENTYL) 10 MG capsule, TAKE 1 CAPSULE (10 MG TOTAL) BY MOUTH 4 (FOUR) TIMES DAILY - BEFORE MEALS AND AT BEDTIME., Disp: 360 capsule, Rfl: 3 .  escitalopram (LEXAPRO) 10 MG tablet, Take 1 tablet (10 mg total) by mouth daily., Disp: 90 tablet, Rfl: 0 .  famotidine (PEPCID) 20 MG tablet, Take 1 tablet (20 mg total) by mouth daily. Before breakfast or dinner, Disp: 90 tablet, Rfl: 3 .  fluticasone (FLONASE) 50 MCG/ACT nasal spray, Place 1 spray into both nostrils 2 (two) times daily., Disp: 16 g, Rfl: 0 .  fluticasone furoate-vilanterol (BREO ELLIPTA) 200-25 MCG/INH AEPB, Inhale 1 puff into the lungs daily., Disp: 28 each, Rfl: 11 .  gabapentin (NEURONTIN) 300 MG capsule, Take 2-3 capsules (600-900 mg total) by mouth See admin instructions. Take 600 mg by mouth in the morning and 900 mg at bedtime, Disp: 450 capsule, Rfl: 1 .  ipratropium-albuterol (DUONEB) 0.5-2.5 (3) MG/3ML SOLN, Take 3 mLs by nebulization every 8 (eight) hours as needed., Disp: 360 mL, Rfl: 12 .  lamoTRIgine (LAMICTAL) 100 MG tablet, Take 1 tablet (100 mg total) by mouth 2 (two) times daily., Disp: 180 tablet, Rfl: 0 .  leflunomide (ARAVA) 20 MG tablet, Take 1 tablet (20 mg total) by mouth daily., Disp: , Rfl:  .  loperamide (IMODIUM A-D) 2 MG tablet, Take 1 tablet (2 mg total) by mouth 4 (four) times daily as needed for diarrhea or loose stools., Disp: 30 tablet, Rfl: 0 .  lovastatin (MEVACOR) 40 MG tablet, Take 1 tablet (40 mg total) by mouth at bedtime., Disp: 90 tablet, Rfl: 3 .  montelukast (SINGULAIR) 10 MG tablet, Take 1 tablet (10 mg total) by mouth daily., Disp: 90 tablet, Rfl: 3 .  multivitamin-lutein (OCUVITE-LUTEIN) CAPS capsule, Take 1  capsule by mouth 2 (two) times daily., Disp: , Rfl:  .  mupirocin ointment (BACTROBAN) 2 %, Apply 1 application topically 2 (two) times daily. X 1 week, Disp: 30 g, Rfl: 0 .  ondansetron (ZOFRAN) 4 MG tablet, Take 1 tablet (4 mg total) by mouth every 8 (eight) hours as needed for nausea or vomiting., Disp: 30 tablet, Rfl: 0 .  oxyCODONE-acetaminophen (PERCOCET) 5-325 MG tablet, Take 1 tablet by mouth 2 (two) times daily as needed for severe pain., Disp: 30 tablet, Rfl: 0 .  pantoprazole (PROTONIX) 40 MG tablet, Take 1 tablet (40 mg total) by mouth 2 (two) times daily. 30 minutes before food. Note reduction in frequency, Disp: 180 tablet, Rfl: 3 .  predniSONE (DELTASONE) 10 MG tablet, 2 tablets (20MG ) for one week, then decrease to 10mg  daily until f/u., Disp: 45 tablet, Rfl: 0 .  QUEtiapine (SEROQUEL) 25 MG tablet, Take 1 tablet (25 mg total) by mouth at bedtime., Disp: 90 tablet, Rfl: 0 .  sucralfate (CARAFATE) 1 g tablet, Take 1 tablet (1 g total) by mouth 4 (four) times daily. TAKE 1 TABLET BY MOUTH 4 TIMES A DAY WITH MEALS AND AT BEDTIME, Disp: 360 tablet, Rfl: 3 .  MYRBETRIQ 50 MG TB24 tablet, TAKE 1 TABLET BY MOUTH EVERYDAY AT BEDTIME, Disp: 30 tablet, Rfl: 5  EXAM:  VITALS per patient if applicable:  GENERAL: alert, oriented, appears well and in no acute distress  HEENT: atraumatic, conjunttiva clear, no obvious abnormalities on inspection of external nose and ears  NECK: normal movements of the head and neck  LUNGS: on inspection no signs of respiratory distress, breathing rate appears normal, no obvious gross SOB, gasping or wheezing  CV: no obvious cyanosis  MS: moves all visible extremities without noticeable abnormality  PSYCH/NEURO: pleasant and cooperative, no obvious depression or anxiety, speech and thought processing grossly intact  ASSESSMENT AND PLAN:  Discussed the following assessment and plan:  Fatigue, unspecified type Labs sch rheum 04/2019  SOB could be  multifactorial  Interstitial pulmonary disease/copd/asthma vs CAD/valve d/o F/u Dr. Patsey Berthold and cards [facility]   Left ovarian cyst - Plan: Ambulatory referral to Obstetrics / Gynecology Dr. Leonides Schanz  IMPRESSION: Simple appearing cystic structure within the left ovary measuring up to 2.9 cm. While this has enlarged since prior ultrasound, this has benign characteristics. Continued follow-up with ultrasound could be performed to ensure stability.      Stage 3a chronic kidney disease - Plan: Ambulatory referral to Nephrology Dr. Candiss Norse  Leg edema - Plan: US Venous Img Lower Bilateral, VAS [place] LOWER EXTREMITY ARTERIAL DUPLEX, Ambulatory referral to Vascular Surgery  PAD (peripheral artery disease) ([place]) - Plan: VAS [place] LOWER EXTREMITY ARTERIAL DUPLEX, Ambulatory referral to Vascular Surgery  Pain in both feet - Plan: Ambulatory referral to Podiatry Dr. Prudence Davidson  Ulcer of lower extremity, unspecified laterality, unspecified ulcer stage ([place]) - Plan: Ambulatory referral to Podiatry  HM Had flu8/31/20 pulm clinic, prevnar, pna 23utd allergic to Tdap declines shingrix  covid 19 vaccine has not had  mammo3/24/2020 repeat normalreferred againwill need to be schedule  Colonoscopy 04/02/16 multiple polyps but tortuous colon GI does not want to do any further  Out of age window pap  DEXA 08/19/16 +osteoporosis on Forteo will need to repeat 1 year after Forteo use to see improvement rheumatology following. -DEXA 10/12/17 with T score -4.1 worse on forteounable to do any further bone densitiesper pt 07/07/2018 due to h/o multiple ortho surgeries +Osteoporosis  Off forteo was on x 2 years F/u [facility] rheumatology Dr. Jefm Bryant   [place] dermatology [place] [facility] get closer to [facility] Retinal specialist 08/2017, wasscheduled 12/2018   Consider h/o in future with persistent leukocytosis  -refer at f/u   -we discussed possible serious and likely etiologies,  options for evaluation and workup, limitations of telemedicine visit vs in person visit, treatment, treatment risks and precautions. Pt prefers to treat via telemedicine empirically rather then risking or undertaking an in person visit at this moment. Patient agrees to seek prompt in person care if worsening, new symptoms arise, or if is not improving with treatment.   I discussed the assessment and treatment plan with the patient. The patient was provided an opportunity to ask questions and all were answered. The patient agreed with the plan and demonstrated an understanding of the instructions.   The patient was advised to call back or seek an in-person evaluation if the symptoms worsen or if the condition fails to improve as anticipated.  Time spent 30 minutes  Delorise Jackson, MD

## 2019-04-23 ENCOUNTER — Other Ambulatory Visit: Payer: Self-pay | Admitting: Urology

## 2019-04-24 DIAGNOSIS — H353211 Exudative age-related macular degeneration, right eye, with active choroidal neovascularization: Secondary | ICD-10-CM | POA: Diagnosis not present

## 2019-04-24 DIAGNOSIS — M0579 Rheumatoid arthritis with rheumatoid factor of multiple sites without organ or systems involvement: Secondary | ICD-10-CM | POA: Diagnosis not present

## 2019-04-24 DIAGNOSIS — Z79899 Other long term (current) drug therapy: Secondary | ICD-10-CM | POA: Diagnosis not present

## 2019-04-25 ENCOUNTER — Telehealth: Payer: Self-pay | Admitting: Internal Medicine

## 2019-04-25 IMAGING — MG DIGITAL DIAGNOSTIC UNILATERAL LEFT MAMMOGRAM WITH TOMO AND CAD
4 series · 4 of 12 positions shown · non-contrast
Comparison: Previous exam(s).

CLINICAL DATA: Recall from screening mammography with
tomosynthesis, possible asymmetry in the retroareolar LEFT breast at
MIDDLE depth, visible only on the MLO images.

EXAM:
DIGITAL DIAGNOSTIC LEFT MAMMOGRAM WITH CAD AND TOMO
ULTRASOUND LEFT BREAST

[L MLO synth-2D]
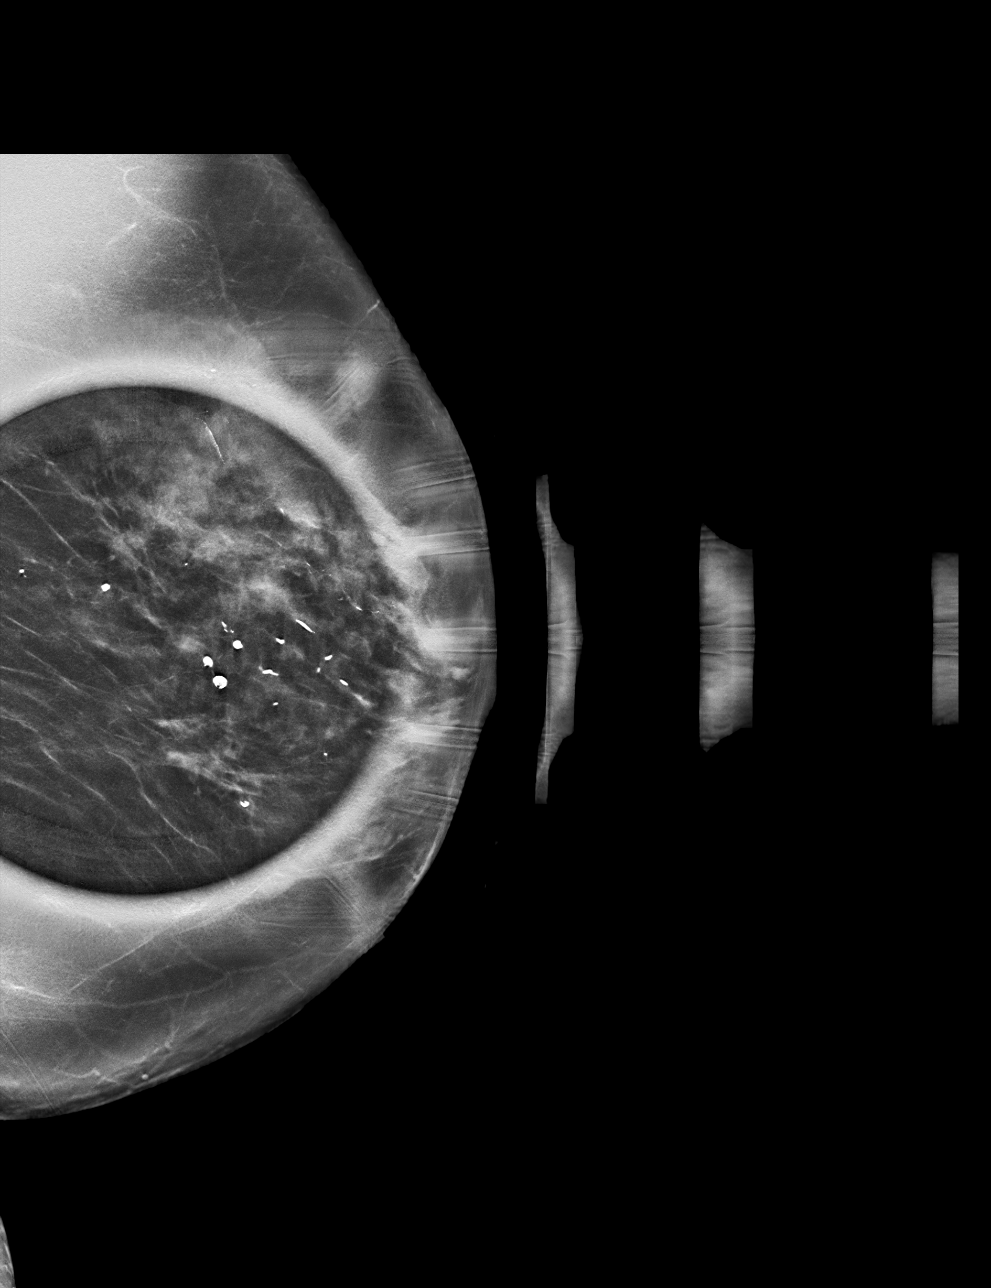

[L ML synth-2D]
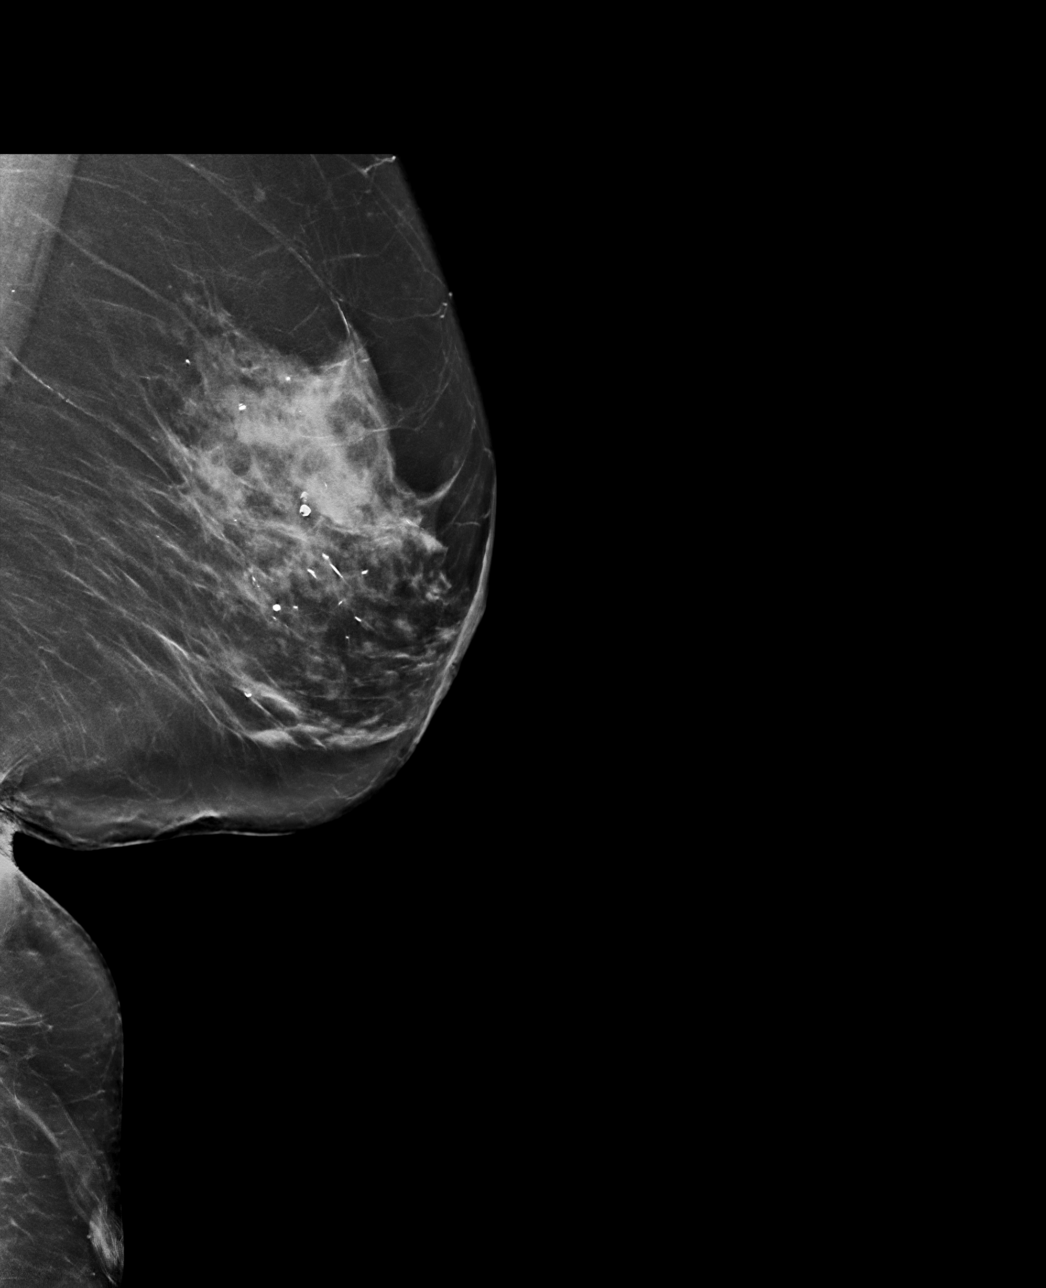

[L MLO tomo · tomo slice 33/64.0]
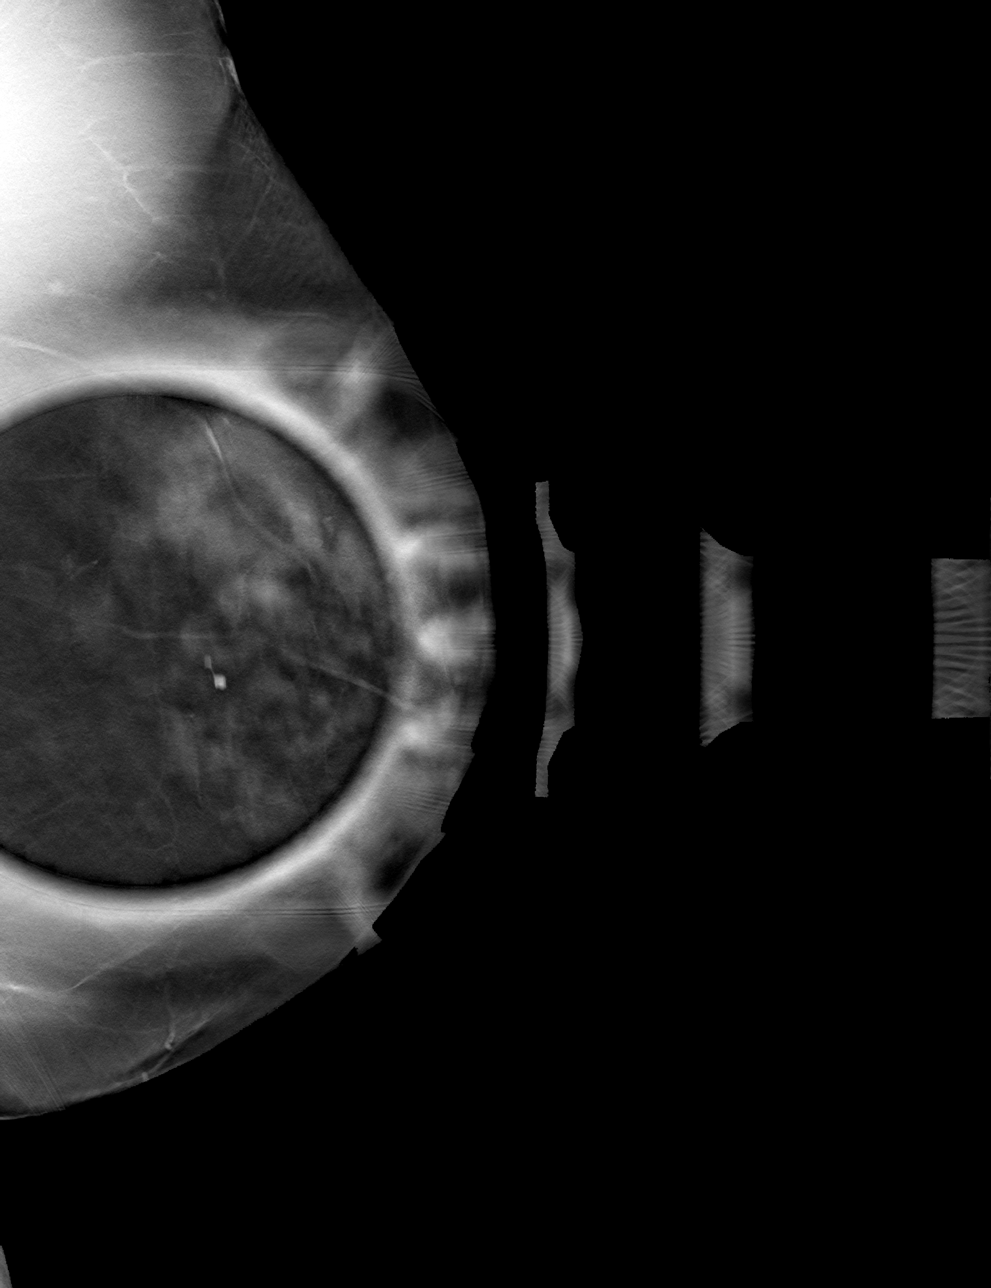

[L ML tomo · tomo slice 41/82.0]
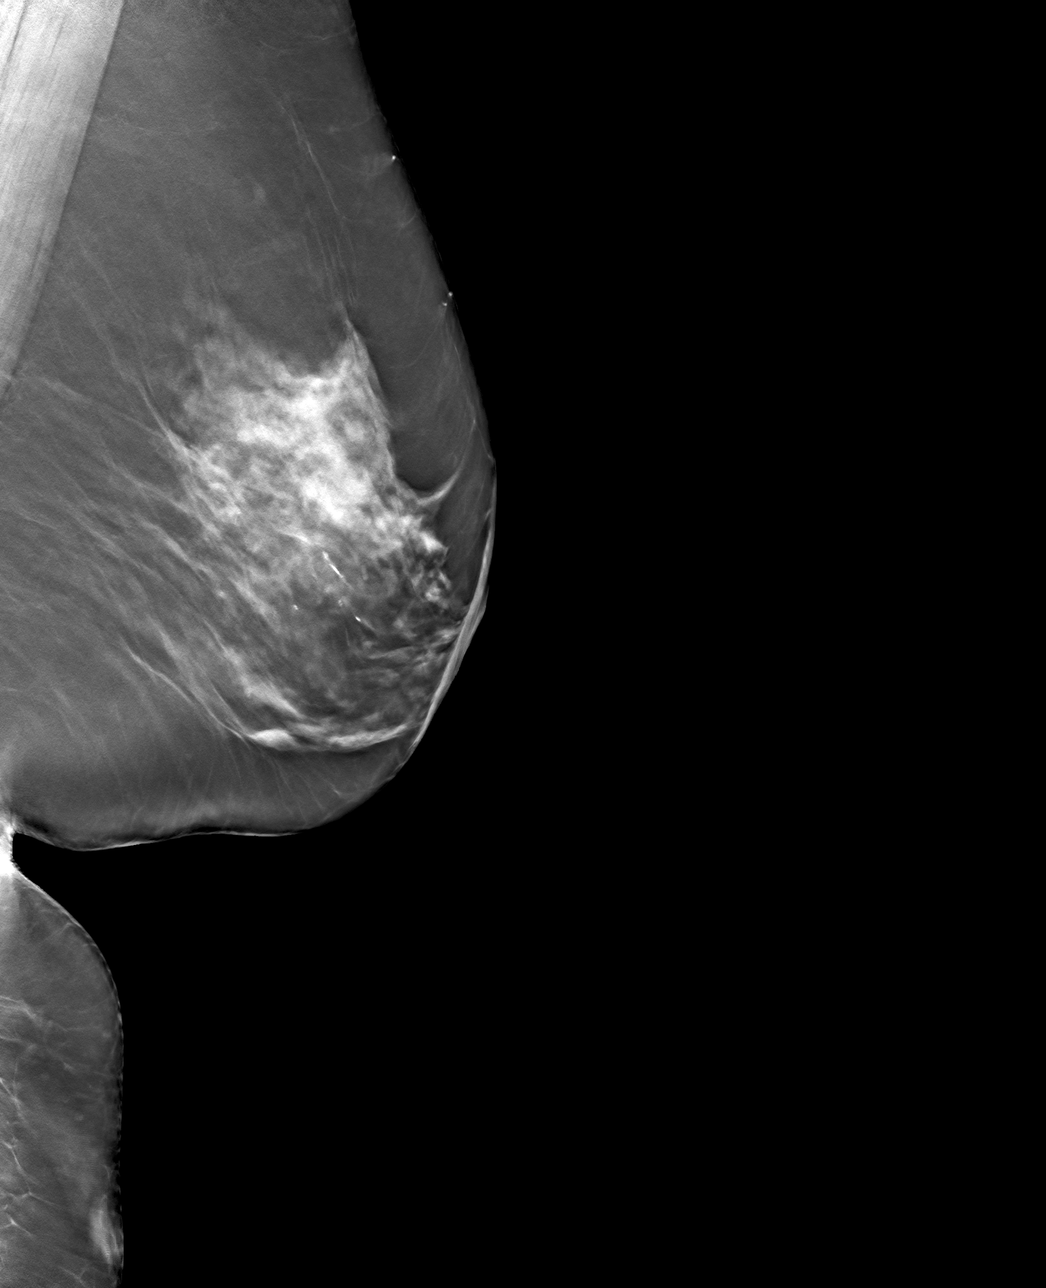

[4 of 12 positions shown; findings below may reference images not displayed]

ACR Breast Density Category c: The breast tissue is heterogeneously
dense, which may obscure small masses.
FINDINGS: Tomosynthesis and synthesized spot compression MLO view of the area
of concern in the LEFT breast and a tomosynthesis and synthesized
full field mediolateral view of the LEFT breast were obtained.

The asymmetry in the retroareolar location at MIDDLE depth persists
though disperses with compression and there is no visible underlying
mass or architectural distortion. The asymmetry moves slightly
superiorly on the full field mediolateral image relative to the full
field MLO image, indicating it may be in the slight INNER breast
near the 11 o'clock location.

No suspicious findings in the LEFT breast.

The full field mediolateral image was processed with CAD.

On correlative physical examination, there is no palpable
abnormality in the UPPER INNER LEFT breast.

Targeted LEFT breast ultrasound is performed, showing normal dense
fibroglandular tissue throughout the UPPER retroareolar LEFT breast,
including an island of fibroglandular tissue at the 11 o'clock
position approximately 6 cm from the nipple at MIDDLE depth with a
morphology similar to that seen on the mammogram. No cyst, solid
mass or abnormal acoustic shadowing is identified.
IMPRESSION: 1. No mammographic or sonographic evidence of malignancy involving
the LEFT breast.
2. The asymmetry questioned on screening mammography corresponds to
an island of normal fibroglandular tissue.

RECOMMENDATION:
Screening mammogram in one year.(Code:L0-F-1JB)

I have discussed the findings and recommendations with the patient.
Results were also provided in writing at the conclusion of the
visit. If applicable, a reminder letter will be sent to the patient
regarding the next appointment.

BI-RADS CATEGORY  1: Negative.

## 2019-04-25 IMAGING — US ULTRASOUND LEFT BREAST LIMITED
1 series · 1 of 1 positions shown · non-contrast
Comparison: Previous exam(s).

CLINICAL DATA: Recall from screening mammography with
tomosynthesis, possible asymmetry in the retroareolar LEFT breast at
MIDDLE depth, visible only on the MLO images.

EXAM:
DIGITAL DIAGNOSTIC LEFT MAMMOGRAM WITH CAD AND TOMO
ULTRASOUND LEFT BREAST

[Series 1: ultrasound left breast limited · 0.08mm/px · 1 of 1 slices shown]
[im 1/1]
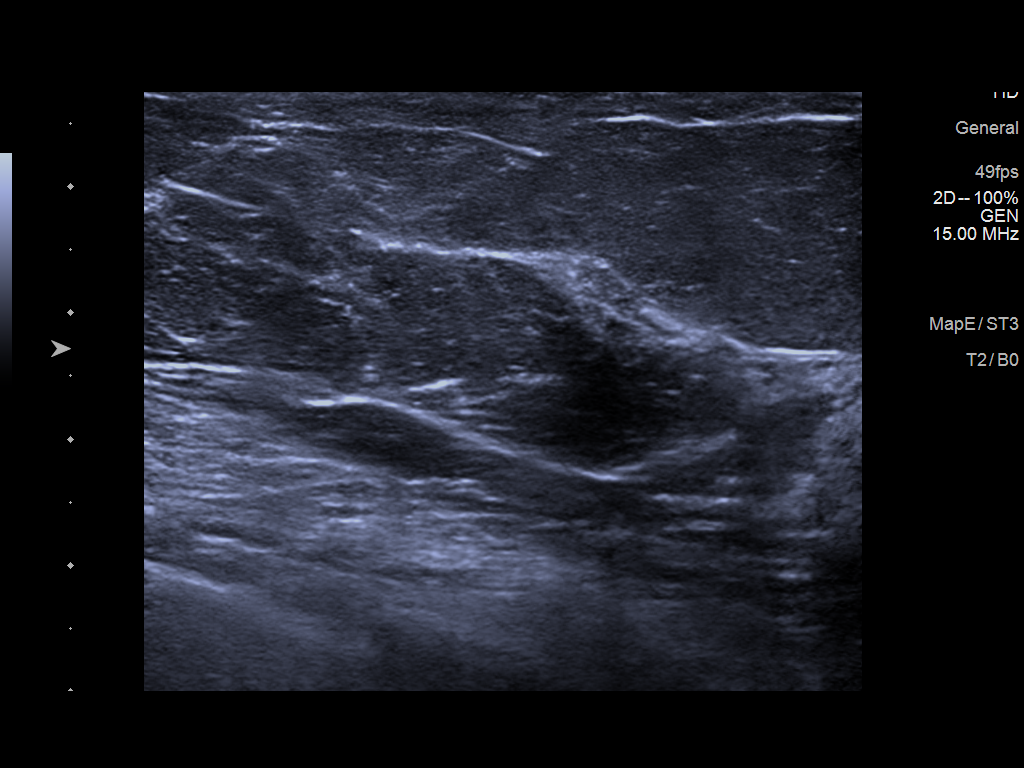

[1 of 1 positions shown; findings below may reference images not displayed]

ACR Breast Density Category c: The breast tissue is heterogeneously
dense, which may obscure small masses.
FINDINGS: Tomosynthesis and synthesized spot compression MLO view of the area
of concern in the LEFT breast and a tomosynthesis and synthesized
full field mediolateral view of the LEFT breast were obtained.

The asymmetry in the retroareolar location at MIDDLE depth persists
though disperses with compression and there is no visible underlying
mass or architectural distortion. The asymmetry moves slightly
superiorly on the full field mediolateral image relative to the full
field MLO image, indicating it may be in the slight INNER breast
near the 11 o'clock location.

No suspicious findings in the LEFT breast.

The full field mediolateral image was processed with CAD.

On correlative physical examination, there is no palpable
abnormality in the UPPER INNER LEFT breast.

Targeted LEFT breast ultrasound is performed, showing normal dense
fibroglandular tissue throughout the UPPER retroareolar LEFT breast,
including an island of fibroglandular tissue at the 11 o'clock
position approximately 6 cm from the nipple at MIDDLE depth with a
morphology similar to that seen on the mammogram. No cyst, solid
mass or abnormal acoustic shadowing is identified.
IMPRESSION: 1. No mammographic or sonographic evidence of malignancy involving
the LEFT breast.
2. The asymmetry questioned on screening mammography corresponds to
an island of normal fibroglandular tissue.

RECOMMENDATION:
Screening mammogram in one year.(Code:L0-F-1JB)

I have discussed the findings and recommendations with the patient.
Results were also provided in writing at the conclusion of the
visit. If applicable, a reminder letter will be sent to the patient
regarding the next appointment.

BI-RADS CATEGORY  1: Negative.

## 2019-04-25 NOTE — Telephone Encounter (Signed)
Pt's grand daughter called and states that pt woke up a little confused and sleeping on and off today. Possible UTI? Please call back

## 2019-04-25 NOTE — Telephone Encounter (Signed)
Okay to order UA and Culture due to cognitive changes?

## 2019-04-26 ENCOUNTER — Telehealth: Payer: Self-pay | Admitting: Internal Medicine

## 2019-04-26 ENCOUNTER — Telehealth: Payer: Self-pay | Admitting: Pulmonary Disease

## 2019-04-26 DIAGNOSIS — J449 Chronic obstructive pulmonary disease, unspecified: Secondary | ICD-10-CM | POA: Diagnosis not present

## 2019-04-26 DIAGNOSIS — S41111D Laceration without foreign body of right upper arm, subsequent encounter: Secondary | ICD-10-CM | POA: Diagnosis not present

## 2019-04-26 DIAGNOSIS — F419 Anxiety disorder, unspecified: Secondary | ICD-10-CM | POA: Diagnosis not present

## 2019-04-26 DIAGNOSIS — F4329 Adjustment disorder with other symptoms: Secondary | ICD-10-CM | POA: Diagnosis not present

## 2019-04-26 DIAGNOSIS — F319 Bipolar disorder, unspecified: Secondary | ICD-10-CM | POA: Diagnosis not present

## 2019-04-26 DIAGNOSIS — M48061 Spinal stenosis, lumbar region without neurogenic claudication: Secondary | ICD-10-CM | POA: Diagnosis not present

## 2019-04-26 DIAGNOSIS — M8008XD Age-related osteoporosis with current pathological fracture, vertebra(e), subsequent encounter for fracture with routine healing: Secondary | ICD-10-CM | POA: Diagnosis not present

## 2019-04-26 DIAGNOSIS — I129 Hypertensive chronic kidney disease with stage 1 through stage 4 chronic kidney disease, or unspecified chronic kidney disease: Secondary | ICD-10-CM | POA: Diagnosis not present

## 2019-04-26 DIAGNOSIS — N183 Chronic kidney disease, stage 3 unspecified: Secondary | ICD-10-CM | POA: Diagnosis not present

## 2019-04-26 NOTE — Telephone Encounter (Signed)
I called and spoke with the grand daughter and made her aware to take her to the ER. She states that she will.

## 2019-04-26 NOTE — Telephone Encounter (Signed)
Patient refused UC or ER stated she will wait for pulmonology to call her back that she feels to bad to go anywhere, advised her she needs medical care now she stated she will wait for call from pulmonology. Patient coughing and wheezing on phone 02 sats at 84 % per PT. Patient refuses to go anywhere.

## 2019-04-26 NOTE — Telephone Encounter (Signed)
She needs to go to the ER 

## 2019-04-26 NOTE — Telephone Encounter (Signed)
I called and spoke with patient's grand daughter. She states that for the past 3-4 days she has had increased sob, cough and weird breathing. She has been using her rescue inhaler and nebulizer more often with no improvement. She states that since yesterday she has seemed out of it and wheezing. She reports that when she had PT today her sats were 87% at rest and she was light headed. They advised her to proceed to the ER and she refused. Her grand daughter states that if we advise her to go to the ER she will make her go. Please advise.

## 2019-04-26 NOTE — Telephone Encounter (Signed)
Called and advised patient she needs to go to ER and of PCP recommendations and she refuses  To go she states her 74 now is now 92 % and she is not going.

## 2019-04-26 NOTE — Telephone Encounter (Signed)
She needs to go to urgent care of ED asap with sats 84 %

## 2019-04-26 NOTE — Telephone Encounter (Signed)
Pt's granddaughter "Morey Hummingbird" states that physical therapist is at the house and took vitals. Oxygen is dipping to 84%. Pt is sleeping more and wheezing as of yesterday. Transferred to [name] at [facility] due to oxygen level.

## 2019-04-27 DIAGNOSIS — F319 Bipolar disorder, unspecified: Secondary | ICD-10-CM | POA: Diagnosis not present

## 2019-04-27 DIAGNOSIS — M8008XD Age-related osteoporosis with current pathological fracture, vertebra(e), subsequent encounter for fracture with routine healing: Secondary | ICD-10-CM | POA: Diagnosis not present

## 2019-04-27 DIAGNOSIS — M48061 Spinal stenosis, lumbar region without neurogenic claudication: Secondary | ICD-10-CM | POA: Diagnosis not present

## 2019-04-27 DIAGNOSIS — I129 Hypertensive chronic kidney disease with stage 1 through stage 4 chronic kidney disease, or unspecified chronic kidney disease: Secondary | ICD-10-CM | POA: Diagnosis not present

## 2019-04-27 DIAGNOSIS — F4329 Adjustment disorder with other symptoms: Secondary | ICD-10-CM | POA: Diagnosis not present

## 2019-04-27 DIAGNOSIS — J449 Chronic obstructive pulmonary disease, unspecified: Secondary | ICD-10-CM | POA: Diagnosis not present

## 2019-04-27 DIAGNOSIS — S41111D Laceration without foreign body of right upper arm, subsequent encounter: Secondary | ICD-10-CM | POA: Diagnosis not present

## 2019-04-27 DIAGNOSIS — F419 Anxiety disorder, unspecified: Secondary | ICD-10-CM | POA: Diagnosis not present

## 2019-04-27 DIAGNOSIS — N183 Chronic kidney disease, stage 3 unspecified: Secondary | ICD-10-CM | POA: Diagnosis not present

## 2019-04-28 DIAGNOSIS — J449 Chronic obstructive pulmonary disease, unspecified: Secondary | ICD-10-CM | POA: Diagnosis not present

## 2019-04-28 DIAGNOSIS — F419 Anxiety disorder, unspecified: Secondary | ICD-10-CM | POA: Diagnosis not present

## 2019-04-28 DIAGNOSIS — F319 Bipolar disorder, unspecified: Secondary | ICD-10-CM | POA: Diagnosis not present

## 2019-04-28 DIAGNOSIS — M48061 Spinal stenosis, lumbar region without neurogenic claudication: Secondary | ICD-10-CM | POA: Diagnosis not present

## 2019-04-28 DIAGNOSIS — M8008XD Age-related osteoporosis with current pathological fracture, vertebra(e), subsequent encounter for fracture with routine healing: Secondary | ICD-10-CM | POA: Diagnosis not present

## 2019-04-28 DIAGNOSIS — F4329 Adjustment disorder with other symptoms: Secondary | ICD-10-CM | POA: Diagnosis not present

## 2019-04-28 DIAGNOSIS — S41111D Laceration without foreign body of right upper arm, subsequent encounter: Secondary | ICD-10-CM | POA: Diagnosis not present

## 2019-04-28 DIAGNOSIS — N183 Chronic kidney disease, stage 3 unspecified: Secondary | ICD-10-CM | POA: Diagnosis not present

## 2019-04-28 DIAGNOSIS — I129 Hypertensive chronic kidney disease with stage 1 through stage 4 chronic kidney disease, or unspecified chronic kidney disease: Secondary | ICD-10-CM | POA: Diagnosis not present

## 2019-05-01 ENCOUNTER — Other Ambulatory Visit: Payer: Self-pay | Admitting: Internal Medicine

## 2019-05-01 DIAGNOSIS — J849 Interstitial pulmonary disease, unspecified: Secondary | ICD-10-CM | POA: Diagnosis not present

## 2019-05-01 DIAGNOSIS — F319 Bipolar disorder, unspecified: Secondary | ICD-10-CM | POA: Diagnosis not present

## 2019-05-01 DIAGNOSIS — Z79899 Other long term (current) drug therapy: Secondary | ICD-10-CM | POA: Diagnosis not present

## 2019-05-01 DIAGNOSIS — F4329 Adjustment disorder with other symptoms: Secondary | ICD-10-CM | POA: Diagnosis not present

## 2019-05-01 DIAGNOSIS — M0579 Rheumatoid arthritis with rheumatoid factor of multiple sites without organ or systems involvement: Secondary | ICD-10-CM | POA: Diagnosis not present

## 2019-05-01 DIAGNOSIS — N3000 Acute cystitis without hematuria: Secondary | ICD-10-CM

## 2019-05-01 DIAGNOSIS — F419 Anxiety disorder, unspecified: Secondary | ICD-10-CM | POA: Diagnosis not present

## 2019-05-01 DIAGNOSIS — M810 Age-related osteoporosis without current pathological fracture: Secondary | ICD-10-CM | POA: Diagnosis not present

## 2019-05-01 DIAGNOSIS — S41111D Laceration without foreign body of right upper arm, subsequent encounter: Secondary | ICD-10-CM | POA: Diagnosis not present

## 2019-05-01 DIAGNOSIS — M8008XD Age-related osteoporosis with current pathological fracture, vertebra(e), subsequent encounter for fracture with routine healing: Secondary | ICD-10-CM | POA: Diagnosis not present

## 2019-05-01 DIAGNOSIS — N183 Chronic kidney disease, stage 3 unspecified: Secondary | ICD-10-CM | POA: Diagnosis not present

## 2019-05-01 DIAGNOSIS — J449 Chronic obstructive pulmonary disease, unspecified: Secondary | ICD-10-CM | POA: Diagnosis not present

## 2019-05-01 DIAGNOSIS — I129 Hypertensive chronic kidney disease with stage 1 through stage 4 chronic kidney disease, or unspecified chronic kidney disease: Secondary | ICD-10-CM | POA: Diagnosis not present

## 2019-05-01 DIAGNOSIS — M48061 Spinal stenosis, lumbar region without neurogenic claudication: Secondary | ICD-10-CM | POA: Diagnosis not present

## 2019-05-01 NOTE — Telephone Encounter (Signed)
Call family grand daughter   Sch for urine asap  If worsening confusion rec go to ED and especially being that her O2 was dropping down to 84% which can cause confusion  She may need oxygen from the lung specialist given her chronic lung disease  -they can call and schedule urgent appt with them as well   Also letting her psychiatrist know she could be depressed after husbands death due to covid 2019/03/25

## 2019-05-02 ENCOUNTER — Encounter: Payer: Self-pay | Admitting: Internal Medicine

## 2019-05-02 DIAGNOSIS — R600 Localized edema: Secondary | ICD-10-CM

## 2019-05-02 DIAGNOSIS — S41111D Laceration without foreign body of right upper arm, subsequent encounter: Secondary | ICD-10-CM | POA: Diagnosis not present

## 2019-05-02 DIAGNOSIS — J449 Chronic obstructive pulmonary disease, unspecified: Secondary | ICD-10-CM | POA: Diagnosis not present

## 2019-05-02 DIAGNOSIS — F319 Bipolar disorder, unspecified: Secondary | ICD-10-CM | POA: Diagnosis not present

## 2019-05-02 DIAGNOSIS — I129 Hypertensive chronic kidney disease with stage 1 through stage 4 chronic kidney disease, or unspecified chronic kidney disease: Secondary | ICD-10-CM | POA: Diagnosis not present

## 2019-05-02 DIAGNOSIS — N183 Chronic kidney disease, stage 3 unspecified: Secondary | ICD-10-CM | POA: Diagnosis not present

## 2019-05-02 DIAGNOSIS — M8008XD Age-related osteoporosis with current pathological fracture, vertebra(e), subsequent encounter for fracture with routine healing: Secondary | ICD-10-CM | POA: Diagnosis not present

## 2019-05-02 DIAGNOSIS — M48061 Spinal stenosis, lumbar region without neurogenic claudication: Secondary | ICD-10-CM | POA: Diagnosis not present

## 2019-05-02 DIAGNOSIS — F4329 Adjustment disorder with other symptoms: Secondary | ICD-10-CM | POA: Diagnosis not present

## 2019-05-02 DIAGNOSIS — F419 Anxiety disorder, unspecified: Secondary | ICD-10-CM | POA: Diagnosis not present

## 2019-05-02 HISTORY — DX: Localized edema: R60.0

## 2019-05-02 NOTE — Telephone Encounter (Signed)
McLean-Scocuzza, Nino Glow, MD  You; [name], MD 4 hours ago (9:45 AM)  TM   Will try to get pt to f/u with pulm though sob may be multifactorial    COPD/Asthma/ILD also has CAD    Rec f/u pulm and cards sob and go to ED if still has         Documentation    [name], RN routed conversation to McLean-Scocuzza, Nino Glow, MD 6 days ago  [name], RN 6 days ago     Called and advised patient she needs to go to ER and of PCP recommendations and she refuses  To go she states her 67 now is now 92 % and she is not going.        Documentation    McLean-Scocuzza, Nino Glow, MD  [name], RN 6 days ago  TM   She needs to go to urgent care of ED asap with sats 84 %            Documentation    [name], RN  McLean-Scocuzza, Nino Glow, MD 6 days ago     Patient refused UC or ER stated she will wait for pulmonology to call her back that she feels to bad to go anywhere, advised her she needs medical care now she stated she will wait for call from pulmonology. Patient coughing and wheezing on phone 02 sats at 84 % per PT. Patient refuses to go anywhere.      Documentation    [name] routed conversation to [name], RN 6 days ago  Kerri Carter 6 days ago  [place]   Pt's granddaughter "Kerri Carter" states that physical therapist is at the house and took vitals. Oxygen is dipping to 84%. Pt is sleeping more and wheezing as of yesterday. Transferred to [name] at [facility] due to oxygen level.

## 2019-05-02 NOTE — Telephone Encounter (Signed)
Will try to get pt to f/u with pulm though sob may be multifactorial   COPD/Asthma/ILD also has CAD   Rec f/u pulm and cards sob and go to ED if still has

## 2019-05-02 NOTE — Telephone Encounter (Signed)
Message added to previous encounter from 7 days ago, 04/25/19

## 2019-05-02 NOTE — Telephone Encounter (Signed)
Left message to return call 

## 2019-05-04 DIAGNOSIS — J449 Chronic obstructive pulmonary disease, unspecified: Secondary | ICD-10-CM | POA: Diagnosis not present

## 2019-05-04 DIAGNOSIS — N183 Chronic kidney disease, stage 3 unspecified: Secondary | ICD-10-CM | POA: Diagnosis not present

## 2019-05-04 DIAGNOSIS — I129 Hypertensive chronic kidney disease with stage 1 through stage 4 chronic kidney disease, or unspecified chronic kidney disease: Secondary | ICD-10-CM | POA: Diagnosis not present

## 2019-05-04 DIAGNOSIS — M48061 Spinal stenosis, lumbar region without neurogenic claudication: Secondary | ICD-10-CM | POA: Diagnosis not present

## 2019-05-04 DIAGNOSIS — M8008XD Age-related osteoporosis with current pathological fracture, vertebra(e), subsequent encounter for fracture with routine healing: Secondary | ICD-10-CM | POA: Diagnosis not present

## 2019-05-04 DIAGNOSIS — F319 Bipolar disorder, unspecified: Secondary | ICD-10-CM | POA: Diagnosis not present

## 2019-05-04 DIAGNOSIS — S41111D Laceration without foreign body of right upper arm, subsequent encounter: Secondary | ICD-10-CM | POA: Diagnosis not present

## 2019-05-04 DIAGNOSIS — F419 Anxiety disorder, unspecified: Secondary | ICD-10-CM | POA: Diagnosis not present

## 2019-05-04 DIAGNOSIS — F4329 Adjustment disorder with other symptoms: Secondary | ICD-10-CM | POA: Diagnosis not present

## 2019-05-05 NOTE — Telephone Encounter (Signed)
Set her up for F/U with me and cards.

## 2019-05-07 NOTE — Telephone Encounter (Signed)
Thank you   Yves Dill can you call [facility] and get pt appt for CAD, SOB?   Thanks [employer]

## 2019-05-08 ENCOUNTER — Ambulatory Visit: Payer: Medicare PPO | Admitting: Podiatry

## 2019-05-08 ENCOUNTER — Other Ambulatory Visit: Payer: Self-pay

## 2019-05-08 ENCOUNTER — Encounter: Payer: Self-pay | Admitting: Podiatry

## 2019-05-08 VITALS — Temp 98.7°F

## 2019-05-08 DIAGNOSIS — F319 Bipolar disorder, unspecified: Secondary | ICD-10-CM | POA: Diagnosis not present

## 2019-05-08 DIAGNOSIS — N183 Chronic kidney disease, stage 3 unspecified: Secondary | ICD-10-CM

## 2019-05-08 DIAGNOSIS — B351 Tinea unguium: Secondary | ICD-10-CM

## 2019-05-08 DIAGNOSIS — R600 Localized edema: Secondary | ICD-10-CM

## 2019-05-08 DIAGNOSIS — I129 Hypertensive chronic kidney disease with stage 1 through stage 4 chronic kidney disease, or unspecified chronic kidney disease: Secondary | ICD-10-CM | POA: Diagnosis not present

## 2019-05-08 DIAGNOSIS — F419 Anxiety disorder, unspecified: Secondary | ICD-10-CM | POA: Diagnosis not present

## 2019-05-08 DIAGNOSIS — S41111D Laceration without foreign body of right upper arm, subsequent encounter: Secondary | ICD-10-CM | POA: Diagnosis not present

## 2019-05-08 DIAGNOSIS — M8008XD Age-related osteoporosis with current pathological fracture, vertebra(e), subsequent encounter for fracture with routine healing: Secondary | ICD-10-CM | POA: Diagnosis not present

## 2019-05-08 DIAGNOSIS — M79675 Pain in left toe(s): Secondary | ICD-10-CM

## 2019-05-08 DIAGNOSIS — J449 Chronic obstructive pulmonary disease, unspecified: Secondary | ICD-10-CM | POA: Diagnosis not present

## 2019-05-08 DIAGNOSIS — M48061 Spinal stenosis, lumbar region without neurogenic claudication: Secondary | ICD-10-CM | POA: Diagnosis not present

## 2019-05-08 DIAGNOSIS — M79674 Pain in right toe(s): Secondary | ICD-10-CM | POA: Diagnosis not present

## 2019-05-08 DIAGNOSIS — F4329 Adjustment disorder with other symptoms: Secondary | ICD-10-CM | POA: Diagnosis not present

## 2019-05-08 NOTE — Progress Notes (Addendum)
This patient returns to my office for at risk foot care.  This patient requires this care by a professional since this patient will be at risk due to having chronic kidney disease.  This patient is unable to cut nails himself since the patient cannot reach his nails.These nails are painful walking and wearing shoes.  This patient presents for at risk foot care today.  General Appearance  Alert, conversant and in no acute stress.  Vascular  Dorsalis pedis  are palpable  bilaterally. Posterior tibial pulses are absent  B/L. Capillary return is within normal limits  bilaterally. Temperature is within normal limits  bilaterally.  Neurologic  Senn-Weinstein monofilament wire test diminished  bilaterally. Muscle power within normal limits bilaterally.  Nails Thick disfigured discolored nails with subungual debris  from hallux to fifth toes bilaterally. No evidence of bacterial infection or drainage bilaterally.  Orthopedic  No limitations of motion  feet .  No crepitus or effusions noted.  No bony pathology or digital deformities noted.  Right ankle swelling.  HAV  B/L.  Pes planus  B/L.  Skin  normotropic skin with no porokeratosis noted bilaterally.  No signs of infections or ulcers noted.     Onychomycosis  Pain in right toes  Pain in left toes  Consent was obtained for treatment procedures.   Mechanical debridement of nails 1-5  bilaterally performed with a nail nipper.  Filed with dremel without incident.    Return office visit    3 months                  Told patient to return for periodic foot care and evaluation due to potential at risk complications.   Ayiden Milliman DPM  

## 2019-05-09 ENCOUNTER — Other Ambulatory Visit: Payer: Self-pay | Admitting: Internal Medicine

## 2019-05-09 DIAGNOSIS — E785 Hyperlipidemia, unspecified: Secondary | ICD-10-CM

## 2019-05-09 MED ORDER — LOVASTATIN 40 MG PO TABS: 40.0000 mg | ORAL_TABLET | Freq: Every day | ORAL | 3 refills | Status: DC

## 2019-05-10 DIAGNOSIS — M8008XD Age-related osteoporosis with current pathological fracture, vertebra(e), subsequent encounter for fracture with routine healing: Secondary | ICD-10-CM | POA: Diagnosis not present

## 2019-05-10 DIAGNOSIS — F319 Bipolar disorder, unspecified: Secondary | ICD-10-CM | POA: Diagnosis not present

## 2019-05-10 DIAGNOSIS — I129 Hypertensive chronic kidney disease with stage 1 through stage 4 chronic kidney disease, or unspecified chronic kidney disease: Secondary | ICD-10-CM | POA: Diagnosis not present

## 2019-05-10 DIAGNOSIS — J449 Chronic obstructive pulmonary disease, unspecified: Secondary | ICD-10-CM | POA: Diagnosis not present

## 2019-05-10 DIAGNOSIS — S41111D Laceration without foreign body of right upper arm, subsequent encounter: Secondary | ICD-10-CM | POA: Diagnosis not present

## 2019-05-10 DIAGNOSIS — F4329 Adjustment disorder with other symptoms: Secondary | ICD-10-CM | POA: Diagnosis not present

## 2019-05-10 DIAGNOSIS — F419 Anxiety disorder, unspecified: Secondary | ICD-10-CM | POA: Diagnosis not present

## 2019-05-10 DIAGNOSIS — N183 Chronic kidney disease, stage 3 unspecified: Secondary | ICD-10-CM | POA: Diagnosis not present

## 2019-05-10 DIAGNOSIS — M48061 Spinal stenosis, lumbar region without neurogenic claudication: Secondary | ICD-10-CM | POA: Diagnosis not present

## 2019-05-11 DIAGNOSIS — J449 Chronic obstructive pulmonary disease, unspecified: Secondary | ICD-10-CM | POA: Diagnosis not present

## 2019-05-11 DIAGNOSIS — S41111D Laceration without foreign body of right upper arm, subsequent encounter: Secondary | ICD-10-CM | POA: Diagnosis not present

## 2019-05-11 DIAGNOSIS — M48061 Spinal stenosis, lumbar region without neurogenic claudication: Secondary | ICD-10-CM | POA: Diagnosis not present

## 2019-05-11 DIAGNOSIS — F419 Anxiety disorder, unspecified: Secondary | ICD-10-CM | POA: Diagnosis not present

## 2019-05-11 DIAGNOSIS — M8008XD Age-related osteoporosis with current pathological fracture, vertebra(e), subsequent encounter for fracture with routine healing: Secondary | ICD-10-CM | POA: Diagnosis not present

## 2019-05-11 DIAGNOSIS — N183 Chronic kidney disease, stage 3 unspecified: Secondary | ICD-10-CM | POA: Diagnosis not present

## 2019-05-11 DIAGNOSIS — F319 Bipolar disorder, unspecified: Secondary | ICD-10-CM | POA: Diagnosis not present

## 2019-05-11 DIAGNOSIS — I129 Hypertensive chronic kidney disease with stage 1 through stage 4 chronic kidney disease, or unspecified chronic kidney disease: Secondary | ICD-10-CM | POA: Diagnosis not present

## 2019-05-11 DIAGNOSIS — F4329 Adjustment disorder with other symptoms: Secondary | ICD-10-CM | POA: Diagnosis not present

## 2019-05-12 NOTE — Telephone Encounter (Addendum)
Pt scheduled with Vernard Gambles, MD 04/27.   Will call [facility] to schedule this patient. Patient informed of Pulmonology appointment  [facility] and they moved patient's appointment to April 15 th at 10:30 am.   Called to inform patient of Cardiology appointment. She states she already has an appointment that day. Informed the patient that Her Vascular appointment is at 2:30 pm and these appointments are 5 hours apart.   Patient states that she is unable to get to the appointment as her transportation is not available until 9:00 am. Informed patient this is a 30 minute drive and that give her an hour in a half.   She states she will not be able to make it. Gave the patient the number to call and re-schedule but that the appointment needs to be soon. Patient verbalized understanding

## 2019-05-16 DIAGNOSIS — J449 Chronic obstructive pulmonary disease, unspecified: Secondary | ICD-10-CM | POA: Diagnosis not present

## 2019-05-16 DIAGNOSIS — S41111D Laceration without foreign body of right upper arm, subsequent encounter: Secondary | ICD-10-CM | POA: Diagnosis not present

## 2019-05-16 DIAGNOSIS — F4329 Adjustment disorder with other symptoms: Secondary | ICD-10-CM | POA: Diagnosis not present

## 2019-05-16 DIAGNOSIS — N183 Chronic kidney disease, stage 3 unspecified: Secondary | ICD-10-CM | POA: Diagnosis not present

## 2019-05-16 DIAGNOSIS — M48061 Spinal stenosis, lumbar region without neurogenic claudication: Secondary | ICD-10-CM | POA: Diagnosis not present

## 2019-05-16 DIAGNOSIS — M8008XD Age-related osteoporosis with current pathological fracture, vertebra(e), subsequent encounter for fracture with routine healing: Secondary | ICD-10-CM | POA: Diagnosis not present

## 2019-05-16 DIAGNOSIS — I129 Hypertensive chronic kidney disease with stage 1 through stage 4 chronic kidney disease, or unspecified chronic kidney disease: Secondary | ICD-10-CM | POA: Diagnosis not present

## 2019-05-16 DIAGNOSIS — F419 Anxiety disorder, unspecified: Secondary | ICD-10-CM | POA: Diagnosis not present

## 2019-05-16 DIAGNOSIS — F319 Bipolar disorder, unspecified: Secondary | ICD-10-CM | POA: Diagnosis not present

## 2019-05-17 DIAGNOSIS — S42125D Nondisplaced fracture of acromial process, left shoulder, subsequent encounter for fracture with routine healing: Secondary | ICD-10-CM | POA: Diagnosis not present

## 2019-05-17 DIAGNOSIS — S42121K Displaced fracture of acromial process, right shoulder, subsequent encounter for fracture with nonunion: Secondary | ICD-10-CM | POA: Diagnosis not present

## 2019-05-17 DIAGNOSIS — S42121D Displaced fracture of acromial process, right shoulder, subsequent encounter for fracture with routine healing: Secondary | ICD-10-CM | POA: Diagnosis not present

## 2019-05-17 DIAGNOSIS — S42125G Nondisplaced fracture of acromial process, left shoulder, subsequent encounter for fracture with delayed healing: Secondary | ICD-10-CM | POA: Diagnosis not present

## 2019-05-18 ENCOUNTER — Other Ambulatory Visit: Payer: Self-pay

## 2019-05-18 ENCOUNTER — Ambulatory Visit (INDEPENDENT_AMBULATORY_CARE_PROVIDER_SITE_OTHER): Payer: Medicare HMO | Admitting: Vascular Surgery

## 2019-05-18 ENCOUNTER — Encounter (INDEPENDENT_AMBULATORY_CARE_PROVIDER_SITE_OTHER): Payer: Self-pay | Admitting: Vascular Surgery

## 2019-05-18 VITALS — BP 127/60 | HR 76 | Resp 16 | Ht 63.0 in | Wt 164.0 lb

## 2019-05-18 DIAGNOSIS — I739 Peripheral vascular disease, unspecified: Secondary | ICD-10-CM | POA: Diagnosis not present

## 2019-05-18 DIAGNOSIS — I872 Venous insufficiency (chronic) (peripheral): Secondary | ICD-10-CM

## 2019-05-18 DIAGNOSIS — I129 Hypertensive chronic kidney disease with stage 1 through stage 4 chronic kidney disease, or unspecified chronic kidney disease: Secondary | ICD-10-CM | POA: Diagnosis not present

## 2019-05-18 DIAGNOSIS — S41111D Laceration without foreign body of right upper arm, subsequent encounter: Secondary | ICD-10-CM | POA: Diagnosis not present

## 2019-05-18 DIAGNOSIS — J449 Chronic obstructive pulmonary disease, unspecified: Secondary | ICD-10-CM

## 2019-05-18 DIAGNOSIS — M48061 Spinal stenosis, lumbar region without neurogenic claudication: Secondary | ICD-10-CM | POA: Diagnosis not present

## 2019-05-18 DIAGNOSIS — M8008XD Age-related osteoporosis with current pathological fracture, vertebra(e), subsequent encounter for fracture with routine healing: Secondary | ICD-10-CM | POA: Diagnosis not present

## 2019-05-18 DIAGNOSIS — N183 Chronic kidney disease, stage 3 unspecified: Secondary | ICD-10-CM | POA: Diagnosis not present

## 2019-05-18 DIAGNOSIS — I10 Essential (primary) hypertension: Secondary | ICD-10-CM

## 2019-05-18 DIAGNOSIS — F419 Anxiety disorder, unspecified: Secondary | ICD-10-CM | POA: Diagnosis not present

## 2019-05-18 DIAGNOSIS — F4329 Adjustment disorder with other symptoms: Secondary | ICD-10-CM | POA: Diagnosis not present

## 2019-05-18 DIAGNOSIS — E785 Hyperlipidemia, unspecified: Secondary | ICD-10-CM

## 2019-05-18 DIAGNOSIS — F319 Bipolar disorder, unspecified: Secondary | ICD-10-CM | POA: Diagnosis not present

## 2019-05-22 DIAGNOSIS — D3131 Benign neoplasm of right choroid: Secondary | ICD-10-CM | POA: Diagnosis not present

## 2019-05-22 DIAGNOSIS — H43813 Vitreous degeneration, bilateral: Secondary | ICD-10-CM | POA: Diagnosis not present

## 2019-05-22 DIAGNOSIS — H353211 Exudative age-related macular degeneration, right eye, with active choroidal neovascularization: Secondary | ICD-10-CM | POA: Diagnosis not present

## 2019-05-22 DIAGNOSIS — H353122 Nonexudative age-related macular degeneration, left eye, intermediate dry stage: Secondary | ICD-10-CM | POA: Diagnosis not present

## 2019-05-24 DIAGNOSIS — M8008XD Age-related osteoporosis with current pathological fracture, vertebra(e), subsequent encounter for fracture with routine healing: Secondary | ICD-10-CM | POA: Diagnosis not present

## 2019-05-24 DIAGNOSIS — I129 Hypertensive chronic kidney disease with stage 1 through stage 4 chronic kidney disease, or unspecified chronic kidney disease: Secondary | ICD-10-CM | POA: Diagnosis not present

## 2019-05-24 DIAGNOSIS — F4329 Adjustment disorder with other symptoms: Secondary | ICD-10-CM | POA: Diagnosis not present

## 2019-05-24 DIAGNOSIS — N183 Chronic kidney disease, stage 3 unspecified: Secondary | ICD-10-CM | POA: Diagnosis not present

## 2019-05-24 DIAGNOSIS — J449 Chronic obstructive pulmonary disease, unspecified: Secondary | ICD-10-CM | POA: Diagnosis not present

## 2019-05-24 DIAGNOSIS — F419 Anxiety disorder, unspecified: Secondary | ICD-10-CM | POA: Diagnosis not present

## 2019-05-24 DIAGNOSIS — M48061 Spinal stenosis, lumbar region without neurogenic claudication: Secondary | ICD-10-CM | POA: Diagnosis not present

## 2019-05-24 DIAGNOSIS — S41111D Laceration without foreign body of right upper arm, subsequent encounter: Secondary | ICD-10-CM | POA: Diagnosis not present

## 2019-05-24 DIAGNOSIS — F319 Bipolar disorder, unspecified: Secondary | ICD-10-CM | POA: Diagnosis not present

## 2019-05-25 ENCOUNTER — Encounter (INDEPENDENT_AMBULATORY_CARE_PROVIDER_SITE_OTHER): Payer: Self-pay | Admitting: Vascular Surgery

## 2019-05-25 DIAGNOSIS — I739 Peripheral vascular disease, unspecified: Secondary | ICD-10-CM | POA: Insufficient documentation

## 2019-05-25 DIAGNOSIS — I872 Venous insufficiency (chronic) (peripheral): Secondary | ICD-10-CM | POA: Insufficient documentation

## 2019-05-25 NOTE — Progress Notes (Signed)
MRN : [id number]  Kerri Carter is a 79 y.o. ([birth year]) female who presents with chief complaint of  Chief Complaint  Patient presents with  . New Patient (Initial Visit)    ref. mclean-scocuzza le edema   .  History of Present Illness:   Patient is seen for evaluation of leg pain and leg swelling. The patient first noticed the swelling remotely. The swelling is associated with pain and discoloration. The pain and swelling worsens with prolonged dependency and improves with elevation. The pain is unrelated to activity.  The patient notes that in the morning the legs are significantly improved but they steadily worsened throughout the course of the day. The patient also notes a steady worsening of the discoloration in the ankle and shin area.   The patient denies claudication symptoms.  The patient denies symptoms consistent with rest pain.  The patient denies and extensive history of DJD and LS spine disease.  The patient has no had any past angiography, interventions or vascular surgery.  Elevation makes the leg symptoms better, dependency makes them much worse. There is no history of ulcerations. The patient denies any recent changes in medications.  The patient has not been wearing graduated compression.  The patient denies a history of DVT or PE. There is no prior history of phlebitis. There is no history of primary lymphedema.  No history of malignancies. No history of trauma or groin or pelvic surgery. There is no history of radiation treatment to the groin or pelvis  The patient denies amaurosis fugax or recent TIA symptoms. There are no recent neurological changes noted. The patient denies recent episodes of angina or shortness of breath  No outpatient medications have been marked as taking for the 05/18/19 encounter (Office Visit) with Delana Meyer, Dolores Lory, MD.    Past Medical History:  Diagnosis Date  . Anxiety   . Asthma   . Bipolar disorder ([place])   . Cataract     s/p b/l repair   . Chicken pox   . CKD (chronic kidney disease)   . CKD (chronic kidney disease), stage III    a. s/p R nephrectomy.  . Conversion disorder   . COPD (chronic obstructive pulmonary disease) ([place])   . Depression   . Essential hypertension   . GERD (gastroesophageal reflux disease)   . Hyperlipidemia   . Inflammatory arthritis    a. hands/carpal tunnel.  b. Low titer rheumatoid factor. c. Negative anti-CCP antibodies. d. Plaquenil.  . Non-Obstructive CAD    a. 07/2009 Cath ([facility]): nonobs dzs;  b. 03/2011 Cath [facility]): nonobs dzs.  . Osteoarthritis    a. Knees.  . PUD (peptic ulcer disease)   . S/P right hip fracture    11/01/16 s/p repair  . Shoulder pain   . Sleep apnea    no cpap  . Spinal stenosis at L4-L5 level    severe with L4/L5 anterolisthesis grade 1 anterolisthesis   . Toxic maculopathy   . Valvular heart disease    a. 07/2015 Echo: EF 55-60%, Mild AI, AS, MR, and TR.    Past Surgical History:  Procedure Laterality Date  . APPENDECTOMY    . BACK SURGERY    . BUNIONECTOMY Right   . CATARACT EXTRACTION, BILATERAL    . CESAREAN SECTION     x1  . CHOLECYSTECTOMY N/A 05/11/2016   Procedure: LAPAROSCOPIC CHOLECYSTECTOMY;  Surgeon: Florene Glen, MD;  Location: [facility] ORS;  Service: General;  Laterality: N/A;  . COLONOSCOPY  WITH PROPOFOL N/A 04/02/2016   Procedure: COLONOSCOPY WITH PROPOFOL;  Surgeon: Jonathon Bellows, MD;  Location: [facility] ENDOSCOPY;  Service: Endoscopy;  Laterality: N/A;  . ENDOSCOPIC RETROGRADE CHOLANGIOPANCREATOGRAPHY (ERCP) WITH PROPOFOL N/A 05/08/2016   Procedure: ENDOSCOPIC RETROGRADE CHOLANGIOPANCREATOGRAPHY (ERCP) WITH PROPOFOL;  Surgeon: Lucilla Lame, MD;  Location: [facility] ENDOSCOPY;  Service: Endoscopy;  Laterality: N/A;  . ERCP     with biliary spincterotomy 05/08/16 Dr. Allen Norris for choledocholithiasis   . ESOPHAGEAL DILATION  04/02/2016   Procedure: ESOPHAGEAL DILATION;  Surgeon: Jonathon Bellows, MD;  Location: [facility] ENDOSCOPY;  Service: Endoscopy;;  .  ESOPHAGOGASTRODUODENOSCOPY (EGD) WITH PROPOFOL N/A 04/02/2016   Procedure: ESOPHAGOGASTRODUODENOSCOPY (EGD) WITH PROPOFOL;  Surgeon: Jonathon Bellows, MD;  Location: [facility] ENDOSCOPY;  Service: Endoscopy;  Laterality: N/A;  . HIP ARTHROPLASTY Right 11/01/2016   Procedure: ARTHROPLASTY BIPOLAR HIP (HEMIARTHROPLASTY);  Surgeon: Corky Mull, MD;  Location: [facility] ORS;  Service: Orthopedics;  Laterality: Right;  . 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 osteoporosis     noted DEXA 08/19/16   . REPLACEMENT TOTAL KNEE Right   . REVERSE SHOULDER ARTHROPLASTY Right 11/04/2017   Procedure: REVERSE SHOULDER ARTHROPLASTY;  Surgeon: Corky Mull, MD;  Location: [facility] ORS;  Service: Orthopedics;  Laterality: Right;  . REVERSE SHOULDER ARTHROPLASTY Left 07/26/2018   Procedure: REVERSE SHOULDER ARTHROPLASTY;  Surgeon: Corky Mull, MD;  Location: [facility] ORS;  Service: Orthopedics;  Laterality: Left;  . TONSILLECTOMY    . TOTAL HIP ARTHROPLASTY  12/10/11   [facility] left hip  . TOTAL HIP ARTHROPLASTY Bilateral   . TUBAL LIGATION      Social History Social History   Tobacco Use  . Smoking status: Former Smoker    Packs/day: 0.50    Years: 20.00    Pack years: 10.00    Types: Cigarettes    Quit date: 02/02/1974    Years since quitting: 45.3  . Smokeless tobacco: Never Used  Substance Use Topics  . Alcohol use: No  . Drug use: No    Family History Family History  Problem Relation Age of Onset  . Rheum arthritis Mother   . Asthma Mother   . Parkinson's disease Mother   . Heart disease Mother   . Stroke Mother   . Hypertension Mother   . Heart attack Father   . Heart disease Father   . Hypertension Father   . Peripheral Artery Disease Father   . Diabetes Son   . Gout Son   . Asthma Sister   . Heart disease Sister   . Lung cancer Sister   . Heart disease Sister   . Heart disease Sister   . Breast cancer Sister   . Heart attack Sister   . Heart disease  Brother   . Heart disease Maternal Grandmother   . Diabetes Maternal Grandmother   . Colon cancer Maternal Grandmother   . Cancer Maternal Grandmother        Hodgkins lymphoma  . Heart disease Brother   . Alcohol abuse Brother   . Depression Brother   . Dementia Son   No family history of bleeding/clotting disorders, porphyria or autoimmune disease   Allergies  Allergen Reactions  . Ceftin [Cefuroxime Axetil] Anaphylaxis  . Lisinopril Anaphylaxis  . Morphine Other (See Comments)    Per patient, low blood pressure issues that requires action to raise it back up. Can take small infrequent doses  . Sulfasalazine Anaphylaxis  . Aspirin Other (See  Comments)    Sulfasalazine allergy cross reacts  . Antihistamines, Chlorpheniramine-Type Other (See Comments)    Makes pt hyper  . Antivert [Meclizine Hcl] Other (See Comments)    Bladder will not empty  . Decongestant [Pseudoephedrine Hcl] Other (See Comments)    Makes pt hyper  . Doxycycline Other (See Comments)    GI upset  . Polymyxin B Other (See Comments)    Medication was in eye drops.  . Sulfa Antibiotics Other (See Comments)    Face swelling  . Xarelto [Rivaroxaban] Other (See Comments)    Stomach burning, bleeding, and tar in stool  . Adhesive [Tape] Rash  . Iodine Hives and Rash    Per patient allergy is to contrast dye only, she is able to use betadine scrubs.  Mack Hook [Levofloxacin In D5w] Rash  . Tetanus Toxoids Rash and Other (See Comments)    Fever and hot to touch at injection site     REVIEW OF SYSTEMS (Negative unless checked)  Constitutional: [] Weight loss  [] Fever  [] Chills Cardiac: [] Chest pain   [] Chest pressure   [] Palpitations   [] Shortness of breath when laying flat   [] Shortness of breath with exertion. Vascular:  [] Pain in legs with walking   [x] Pain in legs at rest  [] History of DVT   [] Phlebitis   [x] Swelling in legs   [] Varicose veins   [] Non-healing ulcers Pulmonary:   [] Uses home oxygen    [] Productive cough   [] Hemoptysis   [] Wheeze  [x] COPD   [x] Asthma Neurologic:  [] Dizziness   [] Seizures   [] History of stroke   [] History of TIA  [] Aphasia   [] Vissual changes   [] Weakness or numbness in arm   [] Weakness or numbness in leg Musculoskeletal:   [] Joint swelling   [] Joint pain   [] Low back pain Hematologic:  [] Easy bruising  [] Easy bleeding   [] Hypercoagulable state   [] Anemic Gastrointestinal:  [] Diarrhea   [] Vomiting  [x] Gastroesophageal reflux/heartburn   [] Difficulty swallowing. Genitourinary:  [x] Chronic kidney disease   [] Difficult urination  [] Frequent urination   [] Blood in urine Skin:  [] Rashes   [] Ulcers  Psychological:  [] History of anxiety   []  History of major depression.  Physical Examination  Vitals:   05/18/19 1423  BP: 127/60  Pulse: 76  Resp: 16  Weight: 164 lb (74.4 kg)  Height: 5\' 3"  (1.6 m)   Body mass index is 29.05 kg/m. Gen: WD/WN, NAD Head: [place]/AT, No temporalis wasting.  Ear/Nose/Throat: Hearing grossly intact, nares w/o erythema or drainage, poor dentition Eyes: PER, EOMI, sclera nonicteric.  Neck: Supple, no masses.  No bruit or JVD.  Pulmonary:  Good air movement, clear to auscultation bilaterally, no use of accessory muscles.  Cardiac: RRR, normal S1, S2, no Murmurs. Vascular: scattered varicosities present bilaterally.  Moderate venous stasis changes to the legs bilaterally associated with cyanosis.  2+ soft pitting edema. Vessel Right Left  Radial Palpable Palpable  Popliteal 2+ Palpable Trace Palpable  PT Trace Palpable 2+ Palpable  DP Not Palpable Not Palpable  Gastrointestinal: soft, non-distended. No guarding/no peritoneal signs.  Musculoskeletal: M/S 5/5 throughout.  No deformity or atrophy.  Neurologic: CN 2-12 intact. Pain and light touch intact in extremities.  Symmetrical.  Speech is fluent. Motor exam as listed above. Psychiatric: Judgment intact, Mood & affect appropriate for pt's clinical situation. Dermatologic: Venous  rashes no ulcers noted.  No changes consistent with cellulitis.   CBC Lab Results  Component Value Date   WBC 14.8 (H) 02/21/2019   HGB 11.7 (  L) 02/21/2019   HCT 37.3 02/21/2019   MCV 89.2 02/21/2019   PLT 222 02/21/2019    BMET    Component Value Date/Time   NA 140 02/21/2019 1627   NA 140 11/24/2013 1622   K 4.0 02/21/2019 1627   K 3.7 11/24/2013 1622   CL 98 02/21/2019 1627   CL 105 11/24/2013 1622   CO2 27 02/21/2019 1627   CO2 31 11/24/2013 1622   GLUCOSE 132 (H) 02/21/2019 1627   GLUCOSE 115 (H) 11/24/2013 1622   BUN 23 02/21/2019 1627   BUN 13 11/24/2013 1622   CREATININE 1.48 (H) 02/21/2019 1627   CREATININE 1.27 (H) 08/06/2017 0910   CALCIUM 9.2 02/21/2019 1627   CALCIUM 9.0 11/24/2013 1622   GFRNONAA 34 (L) 02/21/2019 1627   GFRNONAA 44 (L) 11/24/2013 1622   GFRNONAA 58 (L) 07/22/2013 0549   GFRAA 39 (L) 02/21/2019 1627   GFRAA 54 (L) 11/24/2013 1622   GFRAA >60 07/22/2013 0549   CrCl cannot be calculated (Patient's most recent lab result is older than the maximum 21 days allowed.).  COAG Lab Results  Component Value Date   INR 0.90 10/21/2017   INR 0.99 10/31/2016   INR 0.9 07/03/2013    Radiology No results found.   Assessment/Plan 1. Chronic venous insufficiency No surgery or intervention at this point in time.    I have had a long discussion with the patient regarding venous insufficiency and why it  causes symptoms. I have discussed with the patient the chronic skin changes that accompany venous insufficiency and the long term sequela such as infection and ulceration.  Patient will begin wearing graduated compression stockings class 1 (20-30 mmHg) or compression wraps on a daily basis a prescription was given. The patient will put the stockings on first thing in the morning and removing them in the evening. The patient is instructed specifically not to sleep in the stockings.    In addition, behavioral modification including several periods of  elevation of the lower extremities during the day will be continued. I have demonstrated that proper elevation is a position with the ankles at heart level.  The patient is instructed to begin routine exercise, especially walking on a daily basis  Patient should undergo duplex ultrasound of the venous system to ensure that DVT or reflux is not present.  Following the review of the ultrasound the patient will follow up in 2-3 months to reassess the degree of swelling and the control that graduated compression stockings or compression wraps  is offering.   The patient can be assessed for a Lymph Pump at that time - VAS [place] LOWER EXTREMITY VENOUS (DVT); Future  2. PAD (peripheral artery disease) ([place]) Recommend:  Patient should undergo ABI's of the lower extremity.  The risks and benefits as well as the alternatives were discussed in detail with the patient.  All questions were answered.  Patient agrees to proceed and understands this could be a prelude to angiography and intervention.  The patient will follow up with me in the office to review the studies.   - VAS [place] ABI WITH/WO TBI; Future  3. Essential hypertension Continue antihypertensive medications as already ordered, these medications have been reviewed and there are no changes at this time.   4. Asthma-COPD overlap syndrome ([place]) Continue pulmonary medications and aerosols as already ordered, these medications have been reviewed and there are no changes at this time.    5. Hyperlipidemia, unspecified hyperlipidemia type Continue statin as ordered  and reviewed, no changes at this time    Hortencia Pilar, MD  05/25/2019 12:50 PM

## 2019-05-26 DIAGNOSIS — F319 Bipolar disorder, unspecified: Secondary | ICD-10-CM | POA: Diagnosis not present

## 2019-05-26 DIAGNOSIS — I129 Hypertensive chronic kidney disease with stage 1 through stage 4 chronic kidney disease, or unspecified chronic kidney disease: Secondary | ICD-10-CM | POA: Diagnosis not present

## 2019-05-26 DIAGNOSIS — J449 Chronic obstructive pulmonary disease, unspecified: Secondary | ICD-10-CM | POA: Diagnosis not present

## 2019-05-26 DIAGNOSIS — N183 Chronic kidney disease, stage 3 unspecified: Secondary | ICD-10-CM | POA: Diagnosis not present

## 2019-05-26 DIAGNOSIS — M48061 Spinal stenosis, lumbar region without neurogenic claudication: Secondary | ICD-10-CM | POA: Diagnosis not present

## 2019-05-26 DIAGNOSIS — F419 Anxiety disorder, unspecified: Secondary | ICD-10-CM | POA: Diagnosis not present

## 2019-05-26 DIAGNOSIS — F4329 Adjustment disorder with other symptoms: Secondary | ICD-10-CM | POA: Diagnosis not present

## 2019-05-26 DIAGNOSIS — M8008XD Age-related osteoporosis with current pathological fracture, vertebra(e), subsequent encounter for fracture with routine healing: Secondary | ICD-10-CM | POA: Diagnosis not present

## 2019-05-26 DIAGNOSIS — S41111D Laceration without foreign body of right upper arm, subsequent encounter: Secondary | ICD-10-CM | POA: Diagnosis not present

## 2019-05-29 DIAGNOSIS — M48061 Spinal stenosis, lumbar region without neurogenic claudication: Secondary | ICD-10-CM | POA: Diagnosis not present

## 2019-05-29 DIAGNOSIS — S41111D Laceration without foreign body of right upper arm, subsequent encounter: Secondary | ICD-10-CM | POA: Diagnosis not present

## 2019-05-29 DIAGNOSIS — M8008XD Age-related osteoporosis with current pathological fracture, vertebra(e), subsequent encounter for fracture with routine healing: Secondary | ICD-10-CM | POA: Diagnosis not present

## 2019-05-29 DIAGNOSIS — I129 Hypertensive chronic kidney disease with stage 1 through stage 4 chronic kidney disease, or unspecified chronic kidney disease: Secondary | ICD-10-CM | POA: Diagnosis not present

## 2019-05-29 DIAGNOSIS — F319 Bipolar disorder, unspecified: Secondary | ICD-10-CM | POA: Diagnosis not present

## 2019-05-29 DIAGNOSIS — J449 Chronic obstructive pulmonary disease, unspecified: Secondary | ICD-10-CM | POA: Diagnosis not present

## 2019-05-29 DIAGNOSIS — F419 Anxiety disorder, unspecified: Secondary | ICD-10-CM | POA: Diagnosis not present

## 2019-05-29 DIAGNOSIS — F4329 Adjustment disorder with other symptoms: Secondary | ICD-10-CM | POA: Diagnosis not present

## 2019-05-29 DIAGNOSIS — N183 Chronic kidney disease, stage 3 unspecified: Secondary | ICD-10-CM | POA: Diagnosis not present

## 2019-05-30 ENCOUNTER — Encounter: Payer: Self-pay | Admitting: Pulmonary Disease

## 2019-05-30 ENCOUNTER — Other Ambulatory Visit: Payer: Self-pay | Admitting: Internal Medicine

## 2019-05-30 ENCOUNTER — Other Ambulatory Visit: Payer: Self-pay

## 2019-05-30 ENCOUNTER — Ambulatory Visit (INDEPENDENT_AMBULATORY_CARE_PROVIDER_SITE_OTHER): Payer: Medicare PPO | Admitting: Pulmonary Disease

## 2019-05-30 VITALS — BP 118/72 | HR 68 | Ht 61.5 in | Wt 165.6 lb

## 2019-05-30 DIAGNOSIS — J849 Interstitial pulmonary disease, unspecified: Secondary | ICD-10-CM

## 2019-05-30 DIAGNOSIS — G4733 Obstructive sleep apnea (adult) (pediatric): Secondary | ICD-10-CM

## 2019-05-30 DIAGNOSIS — J449 Chronic obstructive pulmonary disease, unspecified: Secondary | ICD-10-CM

## 2019-05-30 DIAGNOSIS — R05 Cough: Secondary | ICD-10-CM | POA: Diagnosis not present

## 2019-05-30 DIAGNOSIS — R059 Cough, unspecified: Secondary | ICD-10-CM

## 2019-05-30 DIAGNOSIS — G4734 Idiopathic sleep related nonobstructive alveolar hypoventilation: Secondary | ICD-10-CM

## 2019-05-30 DIAGNOSIS — J4489 Other specified chronic obstructive pulmonary disease: Secondary | ICD-10-CM

## 2019-05-30 DIAGNOSIS — L304 Erythema intertrigo: Secondary | ICD-10-CM

## 2019-05-30 MED ORDER — CLOTRIMAZOLE 1 % EX CREA: 1.0000 "application " | TOPICAL_CREAM | Freq: Two times a day (BID) | CUTANEOUS | 11 refills | Status: DC

## 2019-05-30 NOTE — Patient Instructions (Signed)
Use your DuoNeb (ipratropium/albuterol) before you use your budesonide (Pulmicort) twice a day  If you have extra congestion in your chest you can use your DuoNeb at least 2 more times during the day for a total of 4 times if needed  If you can look for a supplement called NAC (N-acetylcysteine) 600 mg twice a day with food.  You can find this supplement stores like the [employer] and [employer].  We will see you in follow-up in 3 months time call sooner should any new difficulties arise

## 2019-05-30 NOTE — Progress Notes (Addendum)
Subjective:    Patient ID: Kerri Carter, female    DOB: 08-09-1940, 79 y.o.   MRN: [id number]  HPI Patient is a 79 year old former smoker who follows for the issue of cough, asthma/COPD overlap syndrome and mild interstitial lung disease related to rheumatoid arthritis.  This is a scheduled visit. Since her last visit, which was by phone, on her nebulizers.  Since that visit also she has lost her husband who had [id number] and severe complications from the same.  She presents today somewhat tearful still and mild depression.  Need for increased use of rescue inhaler she has not been using her DuoNeb regularly and was advised to do so.  Her cough is about baseline most of it is related to gastroesophageal reflux.  She has not been using the DuoNeb before she uses the budesonide nebulized.  She has not been taking N-acetylcysteine as previously instructed.  We wrote instructions for her again.  Eyes, she does not voice any other complaints.  No chest pain no fevers, chills or sweats.  She has not had any lower extremity edema.  No calf tenderness.  Of note she is also on Humira which can make her respiratory symptoms more pronounced.  Immunization History  Administered Date(s) Administered  . Fluad Quad(high Dose 65+) 10/03/2018  . Influenza Split 10/04/2012, 10/25/2013, 10/28/2016  . Influenza Whole 11/03/2011  . Influenza, High Dose Seasonal PF 10/04/2015, 11/03/2016  . Influenza,inj,Quad PF,6+ Mos 10/31/2014  . Influenza-Unspecified 01/02/2010, 11/24/2011, 10/21/2013, 09/28/2017  . Pneumococcal Conjugate-13 10/21/2013  . Pneumococcal Polysaccharide-23 11/07/2015   Allergies  Allergen Reactions  . Ceftin [Cefuroxime Axetil] Anaphylaxis  . Lisinopril Anaphylaxis  . Morphine Other (See Comments)    Per patient, low blood pressure issues that requires action to raise it back up. Can take small infrequent doses  . Sulfasalazine Anaphylaxis  . Aspirin Other (See Comments)    Sulfasalazine  allergy cross reacts  . Antihistamines, Chlorpheniramine-Type Other (See Comments)    Makes pt hyper  . Antivert [Meclizine Hcl] Other (See Comments)    Bladder will not empty  . Decongestant [Pseudoephedrine Hcl] Other (See Comments)    Makes pt hyper  . Doxycycline Other (See Comments)    GI upset  . Polymyxin B Other (See Comments)    Medication was in eye drops.  . Sulfa Antibiotics Other (See Comments)    Face swelling  . Xarelto [Rivaroxaban] Other (See Comments)    Stomach burning, bleeding, and tar in stool  . Adhesive [Tape] Rash  . Iodine Hives and Rash    Per patient allergy is to contrast dye only, she is able to use betadine scrubs.  Mack Hook [Levofloxacin In D5w] Rash  . Tetanus Toxoids Rash and Other (See Comments)    Fever and hot to touch at injection site   Current Meds  Medication Sig  . Adalimumab (HUMIRA PEN) 40 MG/0.4ML PNKT Inject 40 mg into the skin every 14 (fourteen) days.   Marland Kitchen albuterol (VENTOLIN HFA) 108 (90 Base) MCG/ACT inhaler Inhale 2 puffs into the lungs every 6 (six) hours as needed.  . ALPRAZolam (XANAX) 0.25 MG tablet Take 1 tablet (0.25 mg total) by mouth at bedtime as needed for anxiety.  . budesonide (PULMICORT) 0.25 MG/2ML nebulizer solution Take 2 mLs (0.25 mg total) by nebulization 2 (two) times daily.  Marland Kitchen BYSTOLIC 10 MG tablet TAKE 1 TABLET BY MOUTH EVERY DAY  . clotrimazole (LOTRIMIN) 1 % cream Apply 1 application topically 2 (two) times daily. prn (  Patient not taking: Reported on 06/16/2019)  . dicyclomine (BENTYL) 10 MG capsule TAKE 1 CAPSULE (10 MG TOTAL) BY MOUTH 4 (FOUR) TIMES DAILY - BEFORE MEALS AND AT BEDTIME.  Marland Kitchen escitalopram (LEXAPRO) 10 MG tablet Take 1 tablet (10 mg total) by mouth daily.  . famotidine (PEPCID) 20 MG tablet Take 1 tablet (20 mg total) by mouth daily. Before breakfast or dinner  . fluticasone (FLONASE) 50 MCG/ACT nasal spray Place 1 spray into both nostrils 2 (two) times daily.  Marland Kitchen gabapentin (NEURONTIN) 300 MG  capsule Take 2-3 capsules (600-900 mg total) by mouth See admin instructions. Take 600 mg by mouth in the morning and 900 mg at bedtime  . lamoTRIgine (LAMICTAL) 100 MG tablet Take 1 tablet (100 mg total) by mouth 2 (two) times daily.  Marland Kitchen leflunomide (ARAVA) 20 MG tablet Take 1 tablet (20 mg total) by mouth daily.  Marland Kitchen lovastatin (MEVACOR) 40 MG tablet Take 1 tablet (40 mg total) by mouth at bedtime.  . montelukast (SINGULAIR) 10 MG tablet Take 1 tablet (10 mg total) by mouth daily.  . multivitamin-lutein (OCUVITE-LUTEIN) CAPS capsule Take 1 capsule by mouth 2 (two) times daily.  Marland Kitchen MYRBETRIQ 50 MG TB24 tablet TAKE 1 TABLET BY MOUTH EVERYDAY AT BEDTIME  . oxyCODONE-acetaminophen (PERCOCET) 5-325 MG tablet Take 1 tablet by mouth 2 (two) times daily as needed for severe pain. (Patient not taking: Reported on 06/16/2019)  . pantoprazole (PROTONIX) 40 MG tablet Take 1 tablet (40 mg total) by mouth 2 (two) times daily. 30 minutes before food. Note reduction in frequency  . QUEtiapine (SEROQUEL) 25 MG tablet Take 1 tablet (25 mg total) by mouth at bedtime.  . sucralfate (CARAFATE) 1 g tablet Take 1 tablet (1 g total) by mouth 4 (four) times daily. TAKE 1 TABLET BY MOUTH 4 TIMES A DAY WITH MEALS AND AT BEDTIME  . [DISCONTINUED] ipratropium-albuterol (DUONEB) 0.5-2.5 (3) MG/3ML SOLN Take 3 mLs by nebulization every 8 (eight) hours as needed.  . [DISCONTINUED] mupirocin ointment (BACTROBAN) 2 % Apply 1 application topically 2 (two) times daily. X 1 week     Review of Systems A 10 point review of systems was performed and it is as noted above otherwise negative.    Objective:   Physical Exam BP 118/72 (BP Location: Right Arm, Cuff Size: Normal)   Pulse 68   Ht 5' 1.5" (1.562 m)   Wt 165 lb 9.6 oz (75.1 kg)   SpO2 96%   BMI 30.78 kg/m   GENERAL: Well-developed, well-nourished elderly woman, she presents today in a transport chair. Not ill appearing. HEAD: Normocephalic, atraumatic.  EYES: Pupils  equal, round, reactive to light.  No scleral icterus.  MOUTH: Nose/mouth/throat not examined due to masking requirements for COVID 19. NECK: Supple. No thyromegaly. Trachea midline. No JVD.  No adenopathy. PULMONARY: Coarse breath sounds throughout, faint dry crackles at the bases. CARDIOVASCULAR: S1 and S2. Regular rate and rhythm.  Grade 1/6 systolic ejection murmur left sternal border. GASTROINTESTINAL: Benign MUSCULOSKELETAL: Mild changes of RA both hands, no clubbing, no edema.  Limited range of motion of joints due to RA. NEUROLOGIC: No overt focal deficits.  Awake, alert, speech is fluent. SKIN: Intact,warm,dry. PSYCH: Tearful, behavior appropriate.       Assessment & Plan:     ICD-10-CM   1. Interstitial pulmonary disease (HCC)  J84.9    Mild in nature, related to rheumatoid arthritis  2. Cough  R05    Triggered mostly by reflux and poorly controlled asthma/COPD  3. Asthma-COPD overlap syndrome (HCC)  J44.9    Needs to use her DuoNeb and Pulmicort as instructed  4. OSA (obstructive sleep apnea)  G47.33    Continue use of CPAP (previously prescribed) Has been compliant  5. Nocturnal hypoxemia  G47.34    Continue CPAP and supplemental oxygen Has been compliant   Discussion:  Patient should use her DuoNeb as previously instructed.  She has only been using Pulmicort without DuoNeb.  Recommended using DuoNeb before the Pulmicort and 2 extra times during the day if needed.  Getting N-acetylcysteine and recommended that she restart this.  I reminded her of where she can get the supplement.  Appears to have been doing well with this combination previously.  Continue Protonix and antireflux measures.  We will see her in follow-up in 3 months time she is to contact us prior to that time should any new difficulties arise.   Renold Don, MD [facility]  *This note was dictated using voice recognition software/Dragon.  Despite best efforts to proofread, errors can occur  which can change the meaning.  Any change was purely unintentional.

## 2019-05-31 DIAGNOSIS — F419 Anxiety disorder, unspecified: Secondary | ICD-10-CM | POA: Diagnosis not present

## 2019-05-31 DIAGNOSIS — M48061 Spinal stenosis, lumbar region without neurogenic claudication: Secondary | ICD-10-CM | POA: Diagnosis not present

## 2019-05-31 DIAGNOSIS — F4329 Adjustment disorder with other symptoms: Secondary | ICD-10-CM | POA: Diagnosis not present

## 2019-05-31 DIAGNOSIS — I129 Hypertensive chronic kidney disease with stage 1 through stage 4 chronic kidney disease, or unspecified chronic kidney disease: Secondary | ICD-10-CM | POA: Diagnosis not present

## 2019-05-31 DIAGNOSIS — F319 Bipolar disorder, unspecified: Secondary | ICD-10-CM | POA: Diagnosis not present

## 2019-05-31 DIAGNOSIS — M8008XD Age-related osteoporosis with current pathological fracture, vertebra(e), subsequent encounter for fracture with routine healing: Secondary | ICD-10-CM | POA: Diagnosis not present

## 2019-05-31 DIAGNOSIS — S41111D Laceration without foreign body of right upper arm, subsequent encounter: Secondary | ICD-10-CM | POA: Diagnosis not present

## 2019-05-31 DIAGNOSIS — I2510 Atherosclerotic heart disease of native coronary artery without angina pectoris: Secondary | ICD-10-CM | POA: Diagnosis not present

## 2019-05-31 DIAGNOSIS — J449 Chronic obstructive pulmonary disease, unspecified: Secondary | ICD-10-CM | POA: Diagnosis not present

## 2019-06-05 ENCOUNTER — Encounter (INDEPENDENT_AMBULATORY_CARE_PROVIDER_SITE_OTHER): Payer: Self-pay | Admitting: Vascular Surgery

## 2019-06-05 ENCOUNTER — Ambulatory Visit (INDEPENDENT_AMBULATORY_CARE_PROVIDER_SITE_OTHER): Payer: Medicare PPO

## 2019-06-05 ENCOUNTER — Other Ambulatory Visit: Payer: Self-pay

## 2019-06-05 ENCOUNTER — Ambulatory Visit (INDEPENDENT_AMBULATORY_CARE_PROVIDER_SITE_OTHER): Payer: Medicare HMO | Admitting: Vascular Surgery

## 2019-06-05 VITALS — BP 172/67 | HR 71 | Resp 16 | Wt 166.0 lb

## 2019-06-05 DIAGNOSIS — I739 Peripheral vascular disease, unspecified: Secondary | ICD-10-CM

## 2019-06-05 DIAGNOSIS — I872 Venous insufficiency (chronic) (peripheral): Secondary | ICD-10-CM

## 2019-06-05 DIAGNOSIS — I10 Essential (primary) hypertension: Secondary | ICD-10-CM

## 2019-06-05 DIAGNOSIS — E785 Hyperlipidemia, unspecified: Secondary | ICD-10-CM | POA: Diagnosis not present

## 2019-06-05 DIAGNOSIS — I890 Lymphedema, not elsewhere classified: Secondary | ICD-10-CM | POA: Insufficient documentation

## 2019-06-05 NOTE — Progress Notes (Signed)
MRN : [id number]  Kerri Carter is a 79 y.o. (Nov 15, 1940) female who presents with chief complaint of No chief complaint on file. Marland Kitchen  History of Present Illness:   The patient returns to the office for followup evaluation regarding leg swelling.  The swelling has persisted and the pain associated with swelling continues but overall today she says it is better than her initial visit. There have not been any interval development of a ulcerations or wounds.  Since the previous visit the patient has been wearing graduated compression stockings and has noted little if any improvement in the lymphedema. The patient has been using compression frequently.  The patient also states elevation during the day and exercise is being done too.   ABI's Rt = 1.15 and Lt = 1.21 (triphasic Doppler signals bilaterally)  Venous duplex shows the venous system is widely patent no reflux identified  No outpatient medications have been marked as taking for the 06/05/19 encounter (Appointment) with Delana Meyer, Dolores Lory, MD.    Past Medical History:  Diagnosis Date  . Anxiety   . Asthma   . Bipolar disorder ([place])   . Cataract    s/p b/l repair   . Chicken pox   . CKD (chronic kidney disease)   . CKD (chronic kidney disease), stage III    a. s/p R nephrectomy.  . Conversion disorder   . COPD (chronic obstructive pulmonary disease) ([place])   . Depression   . Essential hypertension   . GERD (gastroesophageal reflux disease)   . Hyperlipidemia   . Inflammatory arthritis    a. hands/carpal tunnel.  b. Low titer rheumatoid factor. c. Negative anti-CCP antibodies. d. Plaquenil.  . Non-Obstructive CAD    a. 07/2009 Cath ([facility]): nonobs dzs;  b. 03/2011 Cath [facility]): nonobs dzs.  . Osteoarthritis    a. Knees.  . PUD (peptic ulcer disease)   . S/P right hip fracture    11/01/16 s/p repair  . Shoulder pain   . Sleep apnea    no cpap  . Spinal stenosis at L4-L5 level    severe with L4/L5 anterolisthesis grade 1  anterolisthesis   . Toxic maculopathy   . Valvular heart disease    a. 07/2015 Echo: EF 55-60%, Mild AI, AS, MR, and TR.    Past Surgical History:  Procedure Laterality Date  . APPENDECTOMY    . BACK SURGERY    . BUNIONECTOMY Right   . CATARACT EXTRACTION, BILATERAL    . CESAREAN SECTION     x1  . CHOLECYSTECTOMY N/A 05/11/2016   Procedure: LAPAROSCOPIC CHOLECYSTECTOMY;  Surgeon: Florene Glen, MD;  Location: [facility] ORS;  Service: General;  Laterality: N/A;  . COLONOSCOPY WITH PROPOFOL N/A 04/02/2016   Procedure: COLONOSCOPY WITH PROPOFOL;  Surgeon: Jonathon Bellows, MD;  Location: [facility] ENDOSCOPY;  Service: Endoscopy;  Laterality: N/A;  . ENDOSCOPIC RETROGRADE CHOLANGIOPANCREATOGRAPHY (ERCP) WITH PROPOFOL N/A 05/08/2016   Procedure: ENDOSCOPIC RETROGRADE CHOLANGIOPANCREATOGRAPHY (ERCP) WITH PROPOFOL;  Surgeon: Lucilla Lame, MD;  Location: [facility] ENDOSCOPY;  Service: Endoscopy;  Laterality: N/A;  . ERCP     with biliary spincterotomy 05/08/16 Dr. Allen Norris for choledocholithiasis   . ESOPHAGEAL DILATION  04/02/2016   Procedure: ESOPHAGEAL DILATION;  Surgeon: Jonathon Bellows, MD;  Location: [facility] ENDOS[facility];  Service: Endoscopy;;  . ESOPHAGOGASTRODUODENOSCOPY (EGD) WITH PROPOFOL N/A 04/02/2016   Procedure: ESOPHAGOGASTRODUODENOSCOPY (EGD) WITH PROPOFOL;  Surgeon: Jonathon Bellows, MD;  Location: [facility] ENDOSCOPY;  Service: Endoscopy;  Laterality: N/A;  . HIP ARTHROPLASTY Right 11/01/2016  Procedure: ARTHROPLASTY BIPOLAR HIP (HEMIARTHROPLASTY);  Surgeon: Corky Mull, MD;  Location: [facility] ORS;  Service: Orthopedics;  Laterality: Right;  . 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 osteoporosis     noted DEXA 08/19/16   . REPLACEMENT TOTAL KNEE Right   . REVERSE SHOULDER ARTHROPLASTY Right 11/04/2017   Procedure: REVERSE SHOULDER ARTHROPLASTY;  Surgeon: Corky Mull, MD;  Location: [facility] ORS;  Service: Orthopedics;  Laterality: Right;  . REVERSE SHOULDER ARTHROPLASTY Left 07/26/2018    Procedure: REVERSE SHOULDER ARTHROPLASTY;  Surgeon: Corky Mull, MD;  Location: [facility] OR[facility];  Service: Orthopedics;  Laterality: Left;  . TONSILLECTOMY    . TOTAL HIP ARTHROPLASTY  12/10/11   [facility] left hip  . TOTAL HIP ARTHROPLASTY Bilateral   . TUBAL LIGATION      Social History Social History   Tobacco Use  . Smoking status: Former Smoker    Packs/day: 0.50    Years: 20.00    Pack years: 10.00    Types: Cigarettes    Quit date: 02/02/1974    Years since quitting: 45.3  . Smokeless tobacco: Never Used  Substance Use Topics  . Alcohol use: No  . Drug use: No    Family History Family History  Problem Relation Age of Onset  . Rheum arthritis Mother   . Asthma Mother   . Parkinson's disease Mother   . Heart disease Mother   . Stroke Mother   . Hypertension Mother   . Heart attack Father   . Heart disease Father   . Hypertension Father   . Peripheral Artery Disease Father   . Diabetes Son   . Gout Son   . Asthma Sister   . Heart disease Sister   . Lung cancer Sister   . Heart disease Sister   . Heart disease Sister   . Breast cancer Sister   . Heart attack Sister   . Heart disease Brother   . Heart disease Maternal Grandmother   . Diabetes Maternal Grandmother   . Colon cancer Maternal Grandmother   . Cancer Maternal Grandmother        Hodgkins lymphoma  . Heart disease Brother   . Alcohol abuse Brother   . Depression Brother   . Dementia Son     Allergies  Allergen Reactions  . Ceftin [Cefuroxime Axetil] Anaphylaxis  . Lisinopril Anaphylaxis  . Morphine Other (See Comments)    Per patient, low blood pressure issues that requires action to raise it back up. Can take small infrequent doses  . Sulfasalazine Anaphylaxis  . Aspirin Other (See Comments)    Sulfasalazine allergy cross reacts  . Antihistamines, Chlorpheniramine-Type Other (See Comments)    Makes pt hyper  . Antivert [Meclizine Hcl] Other (See Comments)    Bladder will not empty  .  Decongestant [Pseudoephedrine Hcl] Other (See Comments)    Makes pt hyper  . Doxycycline Other (See Comments)    GI upset  . Polymyxin B Other (See Comments)    Medication was in eye drops.  . Sulfa Antibiotics Other (See Comments)    Face swelling  . Xarelto [Rivaroxaban] Other (See Comments)    Stomach burning, bleeding, and tar in stool  . Adhesive [Tape] Rash  . Iodine Hives and Rash    Per patient allergy is to contrast dye only, she is able to use betadine scrubs.  [place] [Levofloxacin In D5w] Rash  . Tetanus Toxoids Rash  and Other (See Comments)    Fever and hot to touch at injection site     REVIEW OF SYSTEMS (Negative unless checked)  Constitutional: [] Weight loss  [] Fever  [] Chills Cardiac: [] Chest pain   [] Chest pressure   [] Palpitations   [] Shortness of breath when laying flat   [] Shortness of breath with exertion. Vascular:  [] Pain in legs with walking   [x] Pain in legs at rest  [] History of DVT   [] Phlebitis   [x] Swelling in legs   [] Varicose veins   [] Non-healing ulcers Pulmonary:   [] Uses home oxygen   [] Productive cough   [] Hemoptysis   [] Wheeze  [] COPD   [] Asthma Neurologic:  [] Dizziness   [] Seizures   [] History of stroke   [] History of TIA  [] Aphasia   [] Vissual changes   [] Weakness or numbness in arm   [] Weakness or numbness in leg Musculoskeletal:   [] Joint swelling   [] Joint pain   [] Low back pain Hematologic:  [] Easy bruising  [] Easy bleeding   [] Hypercoagulable state   [] Anemic Gastrointestinal:  [] Diarrhea   [] Vomiting  [] Gastroesophageal reflux/heartburn   [] Difficulty swallowing. Genitourinary:  [] Chronic kidney disease   [] Difficult urination  [] Frequent urination   [] Blood in urine Skin:  [] Rashes   [] Ulcers  Psychological:  [] History of anxiety   []  History of major depression.  Physical Examination  There were no vitals filed for this visit. There is no height or weight on file to calculate BMI. Gen: WD/WN, NAD Head: [place]/AT, No temporalis  wasting.  Ear/Nose/Throat: Hearing grossly intact, nares w/o erythema or drainage Eyes: PER, EOMI, sclera nonicteric.  Neck: Supple, no large masses.   Pulmonary:  Good air movement, no audible wheezing bilaterally, no use of accessory muscles.  Cardiac: RRR, no JVD Vascular: scattered varicosities present bilaterally.  Mild venous stasis changes to the legs bilaterally.  2+ soft pitting edema. Gastrointestinal: Non-distended. No guarding/no peritoneal signs.  Musculoskeletal: M/S 5/5 throughout.  No deformity or atrophy.  Neurologic: CN 2-12 intact. Symmetrical.  Speech is fluent. Motor exam as listed above. Psychiatric: Judgment intact, Mood & affect appropriate for pt's clinical situation. Dermatologic: Venous rashes or ulcers noted.  No changes consistent with cellulitis.  CBC Lab Results  Component Value Date   WBC 14.8 (H) 02/21/2019   HGB 11.7 (L) 02/21/2019   HCT 37.3 02/21/2019   MCV 89.2 02/21/2019   PLT 222 02/21/2019    BMET    Component Value Date/Time   NA 140 02/21/2019 1627   NA 140 11/24/2013 1622   K 4.0 02/21/2019 1627   K 3.7 11/24/2013 1622   CL 98 02/21/2019 1627   CL 105 11/24/2013 1622   CO2 27 02/21/2019 1627   CO2 31 11/24/2013 1622   GLUCOSE 132 (H) 02/21/2019 1627   GLUCOSE 115 (H) 11/24/2013 1622   BUN 23 02/21/2019 1627   BUN 13 11/24/2013 1622   CREATININE 1.48 (H) 02/21/2019 1627   CREATININE 1.27 (H) 08/06/2017 0910   CALCIUM 9.2 02/21/2019 1627   CALCIUM 9.0 11/24/2013 1622   GFRNONAA 34 (L) 02/21/2019 1627   GFRNONAA 44 (L) 11/24/2013 1622   GFRNONAA 58 (L) 07/22/2013 0549   GFRAA 39 (L) 02/21/2019 1627   GFRAA 54 (L) 11/24/2013 1622   GFRAA >60 07/22/2013 0549   CrCl cannot be calculated (Patient's most recent lab result is older than the maximum 21 days allowed.).  COAG Lab Results  Component Value Date   INR 0.90 10/21/2017   INR 0.99 10/31/2016   INR 0.9  07/03/2013    Radiology No results  found.   Assessment/Plan 1. Chronic venous insufficiency No surgery or intervention at this point in time.  I have reviewed my discussion with the patient regarding venous insufficiency and why it causes symptoms. I have discussed with the patient the chronic skin changes that accompany venous insufficiency and the long term sequela such as ulceration. Patient will contnue wearing graduated compression stockings on a daily basis, as this has provided excellent control of his edema. The patient will put the stockings on first thing in the morning and removing them in the evening. The patient is reminded not to sleep in the stockings.  In addition, behavioral modification including elevation during the day will be initiated. Exercise is strongly encouraged.  Duplex ultrasound of the bilateral  lower extremity shows patent venous system with no DVT, no reflux identified  Given the patient's good control and lack of any problems regarding the venous insufficiency and lymphedema a lymph pump in not need at this time.  The patient will follow up with me PRN should anything change.  The patient voices agreement with this plan.   2. Lymphedema No surgery or intervention at this point in time.  I have reviewed my discussion with the patient regarding venous insufficiency and why it causes symptoms. I have discussed with the patient the chronic skin changes that accompany venous insufficiency and the long term sequela such as ulceration. Patient will contnue wearing graduated compression stockings on a daily basis, as this has provided excellent control of his edema. The patient will put the stockings on first thing in the morning and removing them in the evening. The patient is reminded not to sleep in the stockings.  In addition, behavioral modification including elevation during the day will be initiated. Exercise is strongly encouraged.  Duplex ultrasound of the bilateral  lower extremity shows  patent venous system with no DVT, no reflux identified  Given the patient's good control and lack of any problems regarding the venous insufficiency and lymphedema a lymph pump in not need at this time.  The patient will follow up with me PRN should anything change.  The patient voices agreement with this plan.   3. Essential hypertension Continue antihypertensive medications as already ordered, these medications have been reviewed and there are no changes at this time.   4. Hyperlipidemia, unspecified hyperlipidemia type Continue statin as ordered and reviewed, no changes at this time     Hortencia Pilar, MD  06/05/2019 12:55 PM

## 2019-06-05 NOTE — Progress Notes (Signed)
Sch in office visit for blood pressure check nurse visit or MD visit   tMS

## 2019-06-06 ENCOUNTER — Telehealth: Payer: Self-pay | Admitting: Internal Medicine

## 2019-06-06 NOTE — Telephone Encounter (Signed)
Left message to return call 

## 2019-06-06 NOTE — Telephone Encounter (Addendum)
-----   Message from Delorise Jackson, MD sent at 06/05/2019  2:53 PM EDT -----  Sch in office visit for blood pressure check nurse visit or MD visit   tMS  ----- Message ----- From: Katha Cabal, MD Sent: 06/05/2019   2:46 PM EDT To: [name], MD

## 2019-06-07 DIAGNOSIS — M8008XD Age-related osteoporosis with current pathological fracture, vertebra(e), subsequent encounter for fracture with routine healing: Secondary | ICD-10-CM | POA: Diagnosis not present

## 2019-06-07 DIAGNOSIS — F419 Anxiety disorder, unspecified: Secondary | ICD-10-CM | POA: Diagnosis not present

## 2019-06-07 DIAGNOSIS — M48061 Spinal stenosis, lumbar region without neurogenic claudication: Secondary | ICD-10-CM | POA: Diagnosis not present

## 2019-06-07 DIAGNOSIS — J449 Chronic obstructive pulmonary disease, unspecified: Secondary | ICD-10-CM | POA: Diagnosis not present

## 2019-06-07 DIAGNOSIS — F319 Bipolar disorder, unspecified: Secondary | ICD-10-CM | POA: Diagnosis not present

## 2019-06-07 DIAGNOSIS — S41111D Laceration without foreign body of right upper arm, subsequent encounter: Secondary | ICD-10-CM | POA: Diagnosis not present

## 2019-06-07 DIAGNOSIS — I129 Hypertensive chronic kidney disease with stage 1 through stage 4 chronic kidney disease, or unspecified chronic kidney disease: Secondary | ICD-10-CM | POA: Diagnosis not present

## 2019-06-07 DIAGNOSIS — F4329 Adjustment disorder with other symptoms: Secondary | ICD-10-CM | POA: Diagnosis not present

## 2019-06-07 DIAGNOSIS — I2510 Atherosclerotic heart disease of native coronary artery without angina pectoris: Secondary | ICD-10-CM | POA: Diagnosis not present

## 2019-06-09 DIAGNOSIS — M48061 Spinal stenosis, lumbar region without neurogenic claudication: Secondary | ICD-10-CM | POA: Diagnosis not present

## 2019-06-09 DIAGNOSIS — I2510 Atherosclerotic heart disease of native coronary artery without angina pectoris: Secondary | ICD-10-CM | POA: Diagnosis not present

## 2019-06-09 DIAGNOSIS — M8008XD Age-related osteoporosis with current pathological fracture, vertebra(e), subsequent encounter for fracture with routine healing: Secondary | ICD-10-CM | POA: Diagnosis not present

## 2019-06-09 DIAGNOSIS — F419 Anxiety disorder, unspecified: Secondary | ICD-10-CM | POA: Diagnosis not present

## 2019-06-09 DIAGNOSIS — F4329 Adjustment disorder with other symptoms: Secondary | ICD-10-CM | POA: Diagnosis not present

## 2019-06-09 DIAGNOSIS — J449 Chronic obstructive pulmonary disease, unspecified: Secondary | ICD-10-CM | POA: Diagnosis not present

## 2019-06-09 DIAGNOSIS — I129 Hypertensive chronic kidney disease with stage 1 through stage 4 chronic kidney disease, or unspecified chronic kidney disease: Secondary | ICD-10-CM | POA: Diagnosis not present

## 2019-06-09 DIAGNOSIS — F319 Bipolar disorder, unspecified: Secondary | ICD-10-CM | POA: Diagnosis not present

## 2019-06-09 DIAGNOSIS — S41111D Laceration without foreign body of right upper arm, subsequent encounter: Secondary | ICD-10-CM | POA: Diagnosis not present

## 2019-06-12 DIAGNOSIS — F319 Bipolar disorder, unspecified: Secondary | ICD-10-CM | POA: Diagnosis not present

## 2019-06-12 DIAGNOSIS — J449 Chronic obstructive pulmonary disease, unspecified: Secondary | ICD-10-CM | POA: Diagnosis not present

## 2019-06-12 DIAGNOSIS — F4329 Adjustment disorder with other symptoms: Secondary | ICD-10-CM | POA: Diagnosis not present

## 2019-06-12 DIAGNOSIS — M8008XD Age-related osteoporosis with current pathological fracture, vertebra(e), subsequent encounter for fracture with routine healing: Secondary | ICD-10-CM | POA: Diagnosis not present

## 2019-06-12 DIAGNOSIS — F419 Anxiety disorder, unspecified: Secondary | ICD-10-CM | POA: Diagnosis not present

## 2019-06-12 DIAGNOSIS — M48061 Spinal stenosis, lumbar region without neurogenic claudication: Secondary | ICD-10-CM | POA: Diagnosis not present

## 2019-06-12 DIAGNOSIS — S41111D Laceration without foreign body of right upper arm, subsequent encounter: Secondary | ICD-10-CM | POA: Diagnosis not present

## 2019-06-12 DIAGNOSIS — I2510 Atherosclerotic heart disease of native coronary artery without angina pectoris: Secondary | ICD-10-CM | POA: Diagnosis not present

## 2019-06-12 DIAGNOSIS — I129 Hypertensive chronic kidney disease with stage 1 through stage 4 chronic kidney disease, or unspecified chronic kidney disease: Secondary | ICD-10-CM | POA: Diagnosis not present

## 2019-06-12 NOTE — Telephone Encounter (Signed)
Spoke with pt and the only available time she can do is 06/16/19 @ 11:30am. Since it is SameDay appt will get Kerri Carter to put pt in appt time.

## 2019-06-12 NOTE — Telephone Encounter (Signed)
Patient scheduled.  For your information

## 2019-06-12 NOTE — Telephone Encounter (Signed)
Left message to return call.  Unable to reach patient. Letter mailed to address on file to call and be scheduled in office.

## 2019-06-14 DIAGNOSIS — S41111D Laceration without foreign body of right upper arm, subsequent encounter: Secondary | ICD-10-CM | POA: Diagnosis not present

## 2019-06-14 DIAGNOSIS — M8008XD Age-related osteoporosis with current pathological fracture, vertebra(e), subsequent encounter for fracture with routine healing: Secondary | ICD-10-CM | POA: Diagnosis not present

## 2019-06-14 DIAGNOSIS — F319 Bipolar disorder, unspecified: Secondary | ICD-10-CM | POA: Diagnosis not present

## 2019-06-14 DIAGNOSIS — I2510 Atherosclerotic heart disease of native coronary artery without angina pectoris: Secondary | ICD-10-CM | POA: Diagnosis not present

## 2019-06-14 DIAGNOSIS — J449 Chronic obstructive pulmonary disease, unspecified: Secondary | ICD-10-CM | POA: Diagnosis not present

## 2019-06-14 DIAGNOSIS — I129 Hypertensive chronic kidney disease with stage 1 through stage 4 chronic kidney disease, or unspecified chronic kidney disease: Secondary | ICD-10-CM | POA: Diagnosis not present

## 2019-06-14 DIAGNOSIS — F4329 Adjustment disorder with other symptoms: Secondary | ICD-10-CM | POA: Diagnosis not present

## 2019-06-14 DIAGNOSIS — F419 Anxiety disorder, unspecified: Secondary | ICD-10-CM | POA: Diagnosis not present

## 2019-06-14 DIAGNOSIS — M48061 Spinal stenosis, lumbar region without neurogenic claudication: Secondary | ICD-10-CM | POA: Diagnosis not present

## 2019-06-16 ENCOUNTER — Other Ambulatory Visit: Payer: Self-pay

## 2019-06-16 ENCOUNTER — Ambulatory Visit: Payer: Medicare PPO | Admitting: Internal Medicine

## 2019-06-16 ENCOUNTER — Encounter: Payer: Self-pay | Admitting: Internal Medicine

## 2019-06-16 VITALS — BP 100/60 | HR 67 | Temp 97.9°F | Ht 61.5 in | Wt 165.6 lb

## 2019-06-16 DIAGNOSIS — G8921 Chronic pain due to trauma: Secondary | ICD-10-CM | POA: Diagnosis not present

## 2019-06-16 DIAGNOSIS — G8929 Other chronic pain: Secondary | ICD-10-CM | POA: Insufficient documentation

## 2019-06-16 DIAGNOSIS — I10 Essential (primary) hypertension: Secondary | ICD-10-CM | POA: Diagnosis not present

## 2019-06-16 DIAGNOSIS — N9489 Other specified conditions associated with female genital organs and menstrual cycle: Secondary | ICD-10-CM | POA: Diagnosis not present

## 2019-06-16 DIAGNOSIS — F4321 Adjustment disorder with depressed mood: Secondary | ICD-10-CM

## 2019-06-16 DIAGNOSIS — Z1329 Encounter for screening for other suspected endocrine disorder: Secondary | ICD-10-CM

## 2019-06-16 DIAGNOSIS — S32040D Wedge compression fracture of fourth lumbar vertebra, subsequent encounter for fracture with routine healing: Secondary | ICD-10-CM | POA: Diagnosis not present

## 2019-06-16 DIAGNOSIS — Z1283 Encounter for screening for malignant neoplasm of skin: Secondary | ICD-10-CM

## 2019-06-16 DIAGNOSIS — R1900 Intra-abdominal and pelvic swelling, mass and lump, unspecified site: Secondary | ICD-10-CM | POA: Diagnosis not present

## 2019-06-16 DIAGNOSIS — E781 Pure hyperglyceridemia: Secondary | ICD-10-CM | POA: Diagnosis not present

## 2019-06-16 DIAGNOSIS — R7303 Prediabetes: Secondary | ICD-10-CM

## 2019-06-16 DIAGNOSIS — N1832 Chronic kidney disease, stage 3b: Secondary | ICD-10-CM

## 2019-06-16 DIAGNOSIS — F4329 Adjustment disorder with other symptoms: Secondary | ICD-10-CM

## 2019-06-16 DIAGNOSIS — Z1389 Encounter for screening for other disorder: Secondary | ICD-10-CM

## 2019-06-16 NOTE — Patient Instructions (Addendum)
10/06/2019 Appointment Cardiology Blazing, Venia Carbon, MD  [facility] [address]  [place]  [place], [place] [address]  [phone]  [phone]    10/06/2019 Office Visit Cardiology Blazing, Venia Carbon, MD  [facility] [address]  [place]  [place], [place] [address]  [phone]  [phone] ([address]    [place], [address], [place], [place] [address]  [phone]  [phone] (Fax)     06/28/2019 Initial consult Obstetrics and Gynecology Sherrie George, [address]  [place], [place] [address]  [phone]  [phone] [facility]  Dermatology you need to see

## 2019-06-16 NOTE — Progress Notes (Signed)
Chief Complaint  Patient presents with  . Blood Pressure Check   F/u office visit 06/16/19   1. Complicated grief doing better after death of husband March 14, 2019 see is on lexapro 10, lamictal 100, seroquel 25 and f/u with psych  2. htn controlled on norvasc 2.5 mg qd  3. Chronic pain since fall early 2021 fracture left arm and pain in left >right arm appt upcoming [place] [facility] for chronic back pain as well due to L4 compression fracture she is not currently taking percocet 5-325 mg prn but has in the past  4. Left adnexal/ovarian cyst due to see [facility] ob/gyn for f/u upcoming to further recs  02/21/19 CT pelvis w/o contrast Cystic left adnexal lesion has enlarged since 2018 where it measured approximately 1.8 x 1.7 cm. This is not well assessed due to streak artifact as well as lack of contrast. Ultrasound follow-up may be helpful in 8-12 weeks   04/05/19 TVUS  IMPRESSION: Simple appearing cystic structure within the left ovary measuring up to 2.9 cm. While this has enlarged since prior ultrasound, this has benign characteristics. Continued follow-up with ultrasound could be performed to ensure stability.    Review of Systems  Constitutional: Negative for weight loss.  HENT: Negative for hearing loss.   Eyes: Negative for blurred vision.  Respiratory: Negative for cough and shortness of breath.   Cardiovascular: Negative for chest pain.  Musculoskeletal: Positive for back pain, falls and joint pain.       Recurrent falls   Skin: Negative for rash.  Psychiatric/Behavioral:       Mood improved since previous visit grief/loss of husband 03/14/19 due to covid 19   Past Medical History:  Diagnosis Date  . Anxiety   . Asthma   . Bipolar disorder ([place])   . Cataract    s/p b/l repair   . Chicken pox   . CKD (chronic kidney disease)   . CKD (chronic kidney disease), stage III    a. s/p R nephrectomy.  . Conversion disorder   . COPD (chronic obstructive pulmonary disease) ([place])   . Depression    . Essential hypertension   . GERD (gastroesophageal reflux disease)   . Hyperlipidemia   . Inflammatory arthritis    a. hands/carpal tunnel.  b. Low titer rheumatoid factor. c. Negative anti-CCP antibodies. d. Plaquenil.  . Non-Obstructive CAD    a. 07/2009 Cath ([facility]): nonobs dzs;  b. 03/2011 Cath [facility]): nonobs dzs.  . Osteoarthritis    a. Knees.  . PUD (peptic ulcer disease)   . S/P right hip fracture    11/01/16 s/p repair  . Shoulder pain   . Sleep apnea    no cpap  . Spinal stenosis at L4-L5 level    severe with L4/L5 anterolisthesis grade 1 anterolisthesis   . Toxic maculopathy   . Valvular heart disease    a. 07/2015 Echo: EF 55-60%, Mild AI, AS, MR, and TR.   Past Surgical History:  Procedure Laterality Date  . APPENDECTOMY    . BACK SURGERY    . BUNIONECTOMY Right   . CATARACT EXTRACTION, BILATERAL    . CESAREAN SECTION     x1  . CHOLECYSTECTOMY N/A 05/11/2016   Procedure: LAPAROSCOPIC CHOLECYSTECTOMY;  Surgeon: Florene Glen, MD;  Location: [facility] ORS;  Service: General;  Laterality: N/A;  . COLONOSCOPY WITH PROPOFOL N/A 04/02/2016   Procedure: COLONOSCOPY WITH PROPOFOL;  Surgeon: Jonathon Bellows, MD;  Location: [facility] ENDOSCOPY;  Service: Endoscopy;  Laterality: N/A;  .  ENDOSCOPIC RETROGRADE CHOLANGIOPANCREATOGRAPHY (ERCP) WITH PROPOFOL N/A 05/08/2016   Procedure: ENDOSCOPIC RETROGRADE CHOLANGIOPANCREATOGRAPHY (ERCP) WITH PROPOFOL;  Surgeon: Lucilla Lame, MD;  Location: [facility] ENDOSCOPY;  Service: Endoscopy;  Laterality: N/A;  . ERCP     with biliary spincterotomy 05/08/16 Dr. Allen Norris for choledocholithiasis   . ESOPHAGEAL DILATION  04/02/2016   Procedure: ESOPHAGEAL DILATION;  Surgeon: Jonathon Bellows, MD;  Location: [facility] ENDOSCOPY;  Service: Endoscopy;;  . ESOPHAGOGASTRODUODENOSCOPY (EGD) WITH PROPOFOL N/A 04/02/2016   Procedure: ESOPHAGOGASTRODUODENOSCOPY (EGD) WITH PROPOFOL;  Surgeon: Jonathon Bellows, MD;  Location: [facility] ENDOSCOPY;  Service: Endoscopy;  Laterality: N/A;  . HIP ARTHROPLASTY  Right 11/01/2016   Procedure: ARTHROPLASTY BIPOLAR HIP (HEMIARTHROPLASTY);  Surgeon: Corky Mull, MD;  Location: [facility] ORS;  Service: Orthopedics;  Laterality: Right;  . 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 osteoporosis     noted DEXA 08/19/16   . REPLACEMENT TOTAL KNEE Right   . REVERSE SHOULDER ARTHROPLASTY Right 11/04/2017   Procedure: REVERSE SHOULDER ARTHROPLASTY;  Surgeon: Corky Mull, MD;  Location: [facility] ORS;  Service: Orthopedics;  Laterality: Right;  . REVERSE SHOULDER ARTHROPLASTY Left 07/26/2018   Procedure: REVERSE SHOULDER ARTHROPLASTY;  Surgeon: Corky Mull, MD;  Location: [facility] ORS;  Service: Orthopedics;  Laterality: Left;  . TONSILLECTOMY    . TOTAL HIP ARTHROPLASTY  12/10/11   [facility] left hip  . TOTAL HIP ARTHROPLASTY Bilateral   . TUBAL LIGATION     Family History  Problem Relation Age of Onset  . Rheum arthritis Mother   . Asthma Mother   . Parkinson's disease Mother   . Heart disease Mother   . Stroke Mother   . Hypertension Mother   . Heart attack Father   . Heart disease Father   . Hypertension Father   . Peripheral Artery Disease Father   . Diabetes Son   . Gout Son   . Asthma Sister   . Heart disease Sister   . Lung cancer Sister   . Heart disease Sister   . Heart disease Sister   . Breast cancer Sister   . Heart attack Sister   . Heart disease Brother   . Heart disease Maternal Grandmother   . Diabetes Maternal Grandmother   . Colon cancer Maternal Grandmother   . Cancer Maternal Grandmother        Hodgkins lymphoma  . Heart disease Brother   . Alcohol abuse Brother   . Depression Brother   . Dementia Son    Social History   Socioeconomic History  . Marital status: Widowed    Spouse name: richard  . Number of children: 2  . Years of education: Some Coll  . Highest education level: Some college, no degree  Occupational History  . Occupation: Retired    Comment: retired  Tobacco Use   . Smoking status: Former Smoker    Packs/day: 0.50    Years: 20.00    Pack years: 10.00    Types: Cigarettes    Quit date: 02/02/1974    Years since quitting: 45.5  . Smokeless tobacco: Never Used  Vaping Use  . Vaping Use: Never used  Substance and Sexual Activity  . Alcohol use: No  . Drug use: No  . Sexual activity: Not Currently  Other Topics Concern  . Not on file  Social History Narrative   Lives at home in [place] grandson, his wife and their child.   Husband Addalyn Gladman died covid  19 late 1/2021married x 69 years   Right-handed.   6 cups coffee per day.   Social Determinants of Health   Financial Resource Strain: Low Risk   . Difficulty of Paying Living Expenses: Not hard at all  Food Insecurity: No Food Insecurity  . Worried About Charity fundraiser in the Last Year: Never true  . Ran Out of Food in the Last Year: Never true  Transportation Needs:   . Lack of Transportation (Medical):   Marland Kitchen Lack of Transportation (Non-Medical):   Physical Activity:   . Days of Exercise per Week:   . Minutes of Exercise per Session:   Stress:   . Feeling of Stress :   Social Connections:   . Frequency of Communication with Friends and Family:   . Frequency of Social Gatherings with Friends and Family:   . Attends Religious Services:   . Active Member of Clubs or Organizations:   . Attends Archivist Meetings:   Marland Kitchen Marital Status:   Intimate Partner Violence:   . Fear of Current or Ex-Partner:   . Emotionally Abused:   Marland Kitchen Physically Abused:   . Sexually Abused:    Current Meds  Medication Sig  . Adalimumab (HUMIRA PEN) 40 MG/0.4ML PNKT Inject 40 mg into the skin every 14 (fourteen) days.   Marland Kitchen albuterol (VENTOLIN HFA) 108 (90 Base) MCG/ACT inhaler Inhale 2 puffs into the lungs every 6 (six) hours as needed.  Marland Kitchen amLODipine (NORVASC) 2.5 MG tablet Take 2.5 mg by mouth daily.  . budesonide (PULMICORT) 0.25 MG/2ML nebulizer solution Take 2 mLs (0.25 mg total) by  nebulization 2 (two) times daily.  Marland Kitchen BYSTOLIC 10 MG tablet TAKE 1 TABLET BY MOUTH EVERY DAY  . dicyclomine (BENTYL) 10 MG capsule TAKE 1 CAPSULE (10 MG TOTAL) BY MOUTH 4 (FOUR) TIMES DAILY - BEFORE MEALS AND AT BEDTIME.  . famotidine (PEPCID) 20 MG tablet Take 1 tablet (20 mg total) by mouth daily. Before breakfast or dinner  . fluticasone (FLONASE) 50 MCG/ACT nasal spray Place 1 spray into both nostrils 2 (two) times daily.  Marland Kitchen gabapentin (NEURONTIN) 300 MG capsule Take 2-3 capsules (600-900 mg total) by mouth See admin instructions. Take 600 mg by mouth in the morning and 900 mg at bedtime  . leflunomide (ARAVA) 20 MG tablet Take 1 tablet (20 mg total) by mouth daily.  Marland Kitchen lovastatin (MEVACOR) 40 MG tablet Take 1 tablet (40 mg total) by mouth at bedtime.  . multivitamin-lutein (OCUVITE-LUTEIN) CAPS capsule Take 1 capsule by mouth 2 (two) times daily.  Marland Kitchen MYRBETRIQ 50 MG TB24 tablet TAKE 1 TABLET BY MOUTH EVERYDAY AT BEDTIME  . pantoprazole (PROTONIX) 40 MG tablet Take 1 tablet (40 mg total) by mouth 2 (two) times daily. 30 minutes before food. Note reduction in frequency  . sucralfate (CARAFATE) 1 g tablet Take 1 tablet (1 g total) by mouth 4 (four) times daily. TAKE 1 TABLET BY MOUTH 4 TIMES A DAY WITH MEALS AND AT BEDTIME  . [DISCONTINUED] ALPRAZolam (XANAX) 0.25 MG tablet Take 1 tablet (0.25 mg total) by mouth at bedtime as needed for anxiety.  . [DISCONTINUED] escitalopram (LEXAPRO) 10 MG tablet Take 1 tablet (10 mg total) by mouth daily.  . [DISCONTINUED] ipratropium-albuterol (DUONEB) 0.5-2.5 (3) MG/3ML SOLN Take 3 mLs by nebulization every 8 (eight) hours as needed.  . [DISCONTINUED] lamoTRIgine (LAMICTAL) 100 MG tablet Take 1 tablet (100 mg total) by mouth 2 (two) times daily.  . [DISCONTINUED] montelukast (SINGULAIR) 10 MG  tablet Take 1 tablet (10 mg total) by mouth daily.  . [DISCONTINUED] QUEtiapine (SEROQUEL) 25 MG tablet Take 1 tablet (25 mg total) by mouth at bedtime.   Allergies   Allergen Reactions  . Ceftin [Cefuroxime Axetil] Anaphylaxis  . Lisinopril Anaphylaxis  . Morphine Other (See Comments)    Per patient, low blood pressure issues that requires action to raise it back up. Can take small infrequent doses  . Sulfasalazine Anaphylaxis  . Aspirin Other (See Comments)    Sulfasalazine allergy cross reacts  . Antihistamines, Chlorpheniramine-Type Other (See Comments)    Makes pt hyper  . Antivert [Meclizine Hcl] Other (See Comments)    Bladder will not empty  . Decongestant [Pseudoephedrine Hcl] Other (See Comments)    Makes pt hyper  . Doxycycline Other (See Comments)    GI upset  . Polymyxin B Other (See Comments)    Medication was in eye drops.  . Sulfa Antibiotics Other (See Comments)    Face swelling  . Xarelto [Rivaroxaban] Other (See Comments)    Stomach burning, bleeding, and tar in stool  . Adhesive [Tape] Rash  . Iodine Hives and Rash    Per patient allergy is to contrast dye only, she is able to use betadine scrubs.  [place] [Levofloxacin In D5w] Rash  . Tetanus Toxoids Rash and Other (See Comments)    Fever and hot to touch at injection site   No results found for this or any previous visit (from the past 2160 hour(s)). Objective  Body mass index is 30.78 kg/m. Wt Readings from Last 3 Encounters:  06/16/19 165 lb 9.6 oz (75.1 kg)  06/05/19 166 lb (75.3 kg)  05/30/19 165 lb 9.6 oz (75.1 kg)   Temp Readings from Last 3 Encounters:  06/16/19 97.9 F (36.6 C) (Temporal)  05/08/19 98.7 F (37.1 C)  02/21/19 98.4 F (36.9 C) (Oral)   BP Readings from Last 3 Encounters:  06/16/19 100/60  06/05/19 (!) 172/67  05/30/19 118/72   Pulse Readings from Last 3 Encounters:  06/16/19 67  06/05/19 71  05/30/19 68    Physical Exam Vitals and nursing note reviewed.  Constitutional:      Appearance: Normal appearance. She is well-developed and well-groomed. She is obese.  HENT:     Head: Normocephalic and atraumatic.  Eyes:      Conjunctiva/sclera: Conjunctivae normal.     Pupils: Pupils are equal, round, and reactive to light.  Cardiovascular:     Rate and Rhythm: Normal rate and regular rhythm.     Heart sounds: Murmur heard.   Pulmonary:     Effort: Pulmonary effort is normal.     Breath sounds: Normal breath sounds. No wheezing.  Skin:    General: Skin is warm and dry.  Neurological:     General: No focal deficit present.     Mental Status: She is alert and oriented to person, place, and time. Mental status is at baseline.     Gait: Gait normal.     Comments: BL walks with cane/rollator   Psychiatric:        Attention and Perception: Attention and perception normal.        Mood and Affect: Mood and affect normal.        Speech: Speech normal.        Behavior: Behavior normal. Behavior is cooperative.        Thought Content: Thought content normal.        Cognition and Memory: Cognition  and memory normal.        Judgment: Judgment normal.     Assessment  Plan  Essential hypertension Cont meds controlled   Complicated grief with h/o conversion d/o and bipolar  Cont f/u with psych as scheduled   Compression fracture of L4 vertebra with routine healing, subsequent encounter Chronic MSK pain due to recurrent falls with injury though using walking device  F/u [place] [facility] appt upcoming  Adnexal mass Pelvic mass in female -f/u [place] [facility] appt upcoming  [facility] Labs had 04/24/19 and 07/21/19 Cr 1.3 GFR 40 stable CKD 3 lfts normal  WBC 8.0 H/H 12.4/39.3  plts 175   Fasting labs lipid, A1C, TSH, UA  12/01/19  Had flu8/31/20 pulm clinic, prevnar, pna 23utd allergic to Tdap declines shingrix covid 19 vaccine has not had  mammo3/24/2020 repeat normalreferred againwill need to be schedule  Colonoscopy 04/02/16 multiple polyps but tortuous colon GI does not want to do any further   Out of age window pap   DEXA 08/19/16 +osteoporosis on Forteo will need to repeat 1 year after Forteo use to see  improvement rheumatology following [place]. -DEXA 10/12/17 with T score -4.1 worse on forteounable to do any further bone densitiesper pt 07/07/2018 due to h/o multiple ortho surgeries +Osteoporosis  Off forteo was on x 2 years [facility]. [facility]ef[facility] [place]   [place] [facility] [place] get closer to home and due for f/u 05/2019 will encourage pt to call  To schedule appt   Retinal specialist 08/2017, wasscheduled 12/2018  Consider h/o in future with persistent leukocytosis  -refer at f/u last CBC WBC normal 07/2019 care everywhere  Echo 11/22/18 [facility]  INTERPRETATION   NORMAL LEFT VENTRICULAR SYSTOLIC FUNCTION WITH MILD LVH   NORMAL RIGHT VENTRICULAR SYSTOLIC FUNCTION   VALVULAR REGURGITATION: MODERATE AR, MODERATE MR, TRIVIAL TR   NO VALVULAR STENOSIS     Provider: Dr. Olivia Mackie McLean-Scocuzza-Internal Medicine

## 2019-06-19 DIAGNOSIS — J449 Chronic obstructive pulmonary disease, unspecified: Secondary | ICD-10-CM | POA: Diagnosis not present

## 2019-06-19 DIAGNOSIS — F4329 Adjustment disorder with other symptoms: Secondary | ICD-10-CM | POA: Diagnosis not present

## 2019-06-19 DIAGNOSIS — M8008XD Age-related osteoporosis with current pathological fracture, vertebra(e), subsequent encounter for fracture with routine healing: Secondary | ICD-10-CM | POA: Diagnosis not present

## 2019-06-19 DIAGNOSIS — S41111D Laceration without foreign body of right upper arm, subsequent encounter: Secondary | ICD-10-CM | POA: Diagnosis not present

## 2019-06-19 DIAGNOSIS — I2510 Atherosclerotic heart disease of native coronary artery without angina pectoris: Secondary | ICD-10-CM | POA: Diagnosis not present

## 2019-06-19 DIAGNOSIS — I129 Hypertensive chronic kidney disease with stage 1 through stage 4 chronic kidney disease, or unspecified chronic kidney disease: Secondary | ICD-10-CM | POA: Diagnosis not present

## 2019-06-19 DIAGNOSIS — F419 Anxiety disorder, unspecified: Secondary | ICD-10-CM | POA: Diagnosis not present

## 2019-06-19 DIAGNOSIS — F319 Bipolar disorder, unspecified: Secondary | ICD-10-CM | POA: Diagnosis not present

## 2019-06-19 DIAGNOSIS — M48061 Spinal stenosis, lumbar region without neurogenic claudication: Secondary | ICD-10-CM | POA: Diagnosis not present

## 2019-06-21 ENCOUNTER — Telehealth: Payer: Self-pay | Admitting: Internal Medicine

## 2019-06-21 DIAGNOSIS — F319 Bipolar disorder, unspecified: Secondary | ICD-10-CM | POA: Diagnosis not present

## 2019-06-21 DIAGNOSIS — I2510 Atherosclerotic heart disease of native coronary artery without angina pectoris: Secondary | ICD-10-CM | POA: Diagnosis not present

## 2019-06-21 DIAGNOSIS — S41111D Laceration without foreign body of right upper arm, subsequent encounter: Secondary | ICD-10-CM | POA: Diagnosis not present

## 2019-06-21 DIAGNOSIS — F419 Anxiety disorder, unspecified: Secondary | ICD-10-CM | POA: Diagnosis not present

## 2019-06-21 DIAGNOSIS — I129 Hypertensive chronic kidney disease with stage 1 through stage 4 chronic kidney disease, or unspecified chronic kidney disease: Secondary | ICD-10-CM | POA: Diagnosis not present

## 2019-06-21 DIAGNOSIS — J449 Chronic obstructive pulmonary disease, unspecified: Secondary | ICD-10-CM | POA: Diagnosis not present

## 2019-06-21 DIAGNOSIS — F4329 Adjustment disorder with other symptoms: Secondary | ICD-10-CM | POA: Diagnosis not present

## 2019-06-21 DIAGNOSIS — M48061 Spinal stenosis, lumbar region without neurogenic claudication: Secondary | ICD-10-CM | POA: Diagnosis not present

## 2019-06-21 DIAGNOSIS — M8008XD Age-related osteoporosis with current pathological fracture, vertebra(e), subsequent encounter for fracture with routine healing: Secondary | ICD-10-CM | POA: Diagnosis not present

## 2019-06-21 NOTE — Telephone Encounter (Signed)
Rejection Reason - Patient was No Show" [place] said about 2 hours ago  "No showed for follow up appointment on 04/10/19, will call patient again to see if she would like to schedule another appointment " [place] said about 2 months ago

## 2019-06-21 NOTE — Telephone Encounter (Signed)
Yes please do

## 2019-06-23 DIAGNOSIS — S42121G Displaced fracture of acromial process, right shoulder, subsequent encounter for fracture with delayed healing: Secondary | ICD-10-CM | POA: Diagnosis not present

## 2019-06-23 DIAGNOSIS — M25512 Pain in left shoulder: Secondary | ICD-10-CM | POA: Diagnosis not present

## 2019-06-23 DIAGNOSIS — M25511 Pain in right shoulder: Secondary | ICD-10-CM | POA: Diagnosis not present

## 2019-06-23 DIAGNOSIS — S42125G Nondisplaced fracture of acromial process, left shoulder, subsequent encounter for fracture with delayed healing: Secondary | ICD-10-CM | POA: Diagnosis not present

## 2019-06-26 DIAGNOSIS — M8008XD Age-related osteoporosis with current pathological fracture, vertebra(e), subsequent encounter for fracture with routine healing: Secondary | ICD-10-CM | POA: Diagnosis not present

## 2019-06-26 DIAGNOSIS — I129 Hypertensive chronic kidney disease with stage 1 through stage 4 chronic kidney disease, or unspecified chronic kidney disease: Secondary | ICD-10-CM | POA: Diagnosis not present

## 2019-06-26 DIAGNOSIS — F319 Bipolar disorder, unspecified: Secondary | ICD-10-CM | POA: Diagnosis not present

## 2019-06-26 DIAGNOSIS — F4329 Adjustment disorder with other symptoms: Secondary | ICD-10-CM | POA: Diagnosis not present

## 2019-06-26 DIAGNOSIS — F419 Anxiety disorder, unspecified: Secondary | ICD-10-CM | POA: Diagnosis not present

## 2019-06-26 DIAGNOSIS — S41111D Laceration without foreign body of right upper arm, subsequent encounter: Secondary | ICD-10-CM | POA: Diagnosis not present

## 2019-06-26 DIAGNOSIS — J449 Chronic obstructive pulmonary disease, unspecified: Secondary | ICD-10-CM | POA: Diagnosis not present

## 2019-06-26 DIAGNOSIS — M48061 Spinal stenosis, lumbar region without neurogenic claudication: Secondary | ICD-10-CM | POA: Diagnosis not present

## 2019-06-26 DIAGNOSIS — I2510 Atherosclerotic heart disease of native coronary artery without angina pectoris: Secondary | ICD-10-CM | POA: Diagnosis not present

## 2019-06-28 ENCOUNTER — Encounter: Payer: Self-pay | Admitting: Pulmonary Disease

## 2019-06-28 DIAGNOSIS — N83202 Unspecified ovarian cyst, left side: Secondary | ICD-10-CM | POA: Diagnosis not present

## 2019-06-30 DIAGNOSIS — M48061 Spinal stenosis, lumbar region without neurogenic claudication: Secondary | ICD-10-CM | POA: Diagnosis not present

## 2019-06-30 DIAGNOSIS — I129 Hypertensive chronic kidney disease with stage 1 through stage 4 chronic kidney disease, or unspecified chronic kidney disease: Secondary | ICD-10-CM | POA: Diagnosis not present

## 2019-06-30 DIAGNOSIS — S41111D Laceration without foreign body of right upper arm, subsequent encounter: Secondary | ICD-10-CM | POA: Diagnosis not present

## 2019-06-30 DIAGNOSIS — M8008XD Age-related osteoporosis with current pathological fracture, vertebra(e), subsequent encounter for fracture with routine healing: Secondary | ICD-10-CM | POA: Diagnosis not present

## 2019-06-30 DIAGNOSIS — I2510 Atherosclerotic heart disease of native coronary artery without angina pectoris: Secondary | ICD-10-CM | POA: Diagnosis not present

## 2019-06-30 DIAGNOSIS — F4329 Adjustment disorder with other symptoms: Secondary | ICD-10-CM | POA: Diagnosis not present

## 2019-06-30 DIAGNOSIS — J449 Chronic obstructive pulmonary disease, unspecified: Secondary | ICD-10-CM | POA: Diagnosis not present

## 2019-06-30 DIAGNOSIS — F319 Bipolar disorder, unspecified: Secondary | ICD-10-CM | POA: Diagnosis not present

## 2019-06-30 DIAGNOSIS — F419 Anxiety disorder, unspecified: Secondary | ICD-10-CM | POA: Diagnosis not present

## 2019-07-04 ENCOUNTER — Other Ambulatory Visit: Payer: Self-pay | Admitting: Pulmonary Disease

## 2019-07-04 DIAGNOSIS — J209 Acute bronchitis, unspecified: Secondary | ICD-10-CM

## 2019-07-04 DIAGNOSIS — J440 Chronic obstructive pulmonary disease with acute lower respiratory infection: Secondary | ICD-10-CM

## 2019-07-05 DIAGNOSIS — I129 Hypertensive chronic kidney disease with stage 1 through stage 4 chronic kidney disease, or unspecified chronic kidney disease: Secondary | ICD-10-CM | POA: Diagnosis not present

## 2019-07-05 DIAGNOSIS — F4329 Adjustment disorder with other symptoms: Secondary | ICD-10-CM | POA: Diagnosis not present

## 2019-07-05 DIAGNOSIS — M8008XD Age-related osteoporosis with current pathological fracture, vertebra(e), subsequent encounter for fracture with routine healing: Secondary | ICD-10-CM | POA: Diagnosis not present

## 2019-07-05 DIAGNOSIS — I2510 Atherosclerotic heart disease of native coronary artery without angina pectoris: Secondary | ICD-10-CM | POA: Diagnosis not present

## 2019-07-05 DIAGNOSIS — J449 Chronic obstructive pulmonary disease, unspecified: Secondary | ICD-10-CM | POA: Diagnosis not present

## 2019-07-05 DIAGNOSIS — F319 Bipolar disorder, unspecified: Secondary | ICD-10-CM | POA: Diagnosis not present

## 2019-07-05 DIAGNOSIS — S41111D Laceration without foreign body of right upper arm, subsequent encounter: Secondary | ICD-10-CM | POA: Diagnosis not present

## 2019-07-05 DIAGNOSIS — F419 Anxiety disorder, unspecified: Secondary | ICD-10-CM | POA: Diagnosis not present

## 2019-07-05 DIAGNOSIS — M48061 Spinal stenosis, lumbar region without neurogenic claudication: Secondary | ICD-10-CM | POA: Diagnosis not present

## 2019-07-06 ENCOUNTER — Telehealth: Payer: Self-pay | Admitting: Internal Medicine

## 2019-07-06 NOTE — Telephone Encounter (Signed)
-----   Message from Delorise Jackson, MD sent at 06/16/2019  1:06 PM EDT ----- Cancel appt 08/03/19 fasting labs sch 12/01/19 and f/u after labs sch appt Thanks

## 2019-07-06 NOTE — Telephone Encounter (Signed)
Left message to return call and schedule labs and New follow up. 7/1 appointment canceled.

## 2019-07-07 ENCOUNTER — Telehealth: Payer: Self-pay | Admitting: Pulmonary Disease

## 2019-07-07 MED ORDER — IPRATROPIUM-ALBUTEROL 0.5-2.5 (3) MG/3ML IN SOLN: 3.0000 mL | Freq: Three times a day (TID) | RESPIRATORY_TRACT | 3 refills | Status: DC | PRN

## 2019-07-07 NOTE — Telephone Encounter (Signed)
Called and spoke with patient to verify which medication because what she was requesting is not on her med list. According to last OV note she is supposed to be doing DuoNeb before the Pulmicort and 2 extra times during the day if needed.   Patient verified medication and pharmacy RX has been sent. Nothing further needed at this time.

## 2019-07-11 DIAGNOSIS — I2510 Atherosclerotic heart disease of native coronary artery without angina pectoris: Secondary | ICD-10-CM | POA: Diagnosis not present

## 2019-07-11 DIAGNOSIS — J449 Chronic obstructive pulmonary disease, unspecified: Secondary | ICD-10-CM | POA: Diagnosis not present

## 2019-07-11 DIAGNOSIS — M48061 Spinal stenosis, lumbar region without neurogenic claudication: Secondary | ICD-10-CM | POA: Diagnosis not present

## 2019-07-11 DIAGNOSIS — F419 Anxiety disorder, unspecified: Secondary | ICD-10-CM | POA: Diagnosis not present

## 2019-07-11 DIAGNOSIS — I129 Hypertensive chronic kidney disease with stage 1 through stage 4 chronic kidney disease, or unspecified chronic kidney disease: Secondary | ICD-10-CM | POA: Diagnosis not present

## 2019-07-11 DIAGNOSIS — M8008XD Age-related osteoporosis with current pathological fracture, vertebra(e), subsequent encounter for fracture with routine healing: Secondary | ICD-10-CM | POA: Diagnosis not present

## 2019-07-11 DIAGNOSIS — F319 Bipolar disorder, unspecified: Secondary | ICD-10-CM | POA: Diagnosis not present

## 2019-07-11 DIAGNOSIS — F4329 Adjustment disorder with other symptoms: Secondary | ICD-10-CM | POA: Diagnosis not present

## 2019-07-11 DIAGNOSIS — S41111D Laceration without foreign body of right upper arm, subsequent encounter: Secondary | ICD-10-CM | POA: Diagnosis not present

## 2019-07-13 ENCOUNTER — Other Ambulatory Visit: Payer: Self-pay

## 2019-07-13 ENCOUNTER — Telehealth (INDEPENDENT_AMBULATORY_CARE_PROVIDER_SITE_OTHER): Payer: Medicare PPO | Admitting: Psychiatry

## 2019-07-13 ENCOUNTER — Encounter (HOSPITAL_COMMUNITY): Payer: Self-pay | Admitting: Psychiatry

## 2019-07-13 ENCOUNTER — Telehealth: Payer: Self-pay | Admitting: Psychiatry

## 2019-07-13 DIAGNOSIS — F3176 Bipolar disorder, in full remission, most recent episode depressed: Secondary | ICD-10-CM | POA: Insufficient documentation

## 2019-07-13 DIAGNOSIS — F419 Anxiety disorder, unspecified: Secondary | ICD-10-CM | POA: Diagnosis not present

## 2019-07-13 IMAGING — CR CHEST - 2 VIEW
1 series · 2 of 2 positions shown · non-contrast
Comparison: 03/25/2018

CLINICAL DATA: Patient reports productive cough with blood at times
and SOB x 3 weeks. Denies fever. Patient also reports she has been
choking on liquids lately, aspiration per ordering notes.

EXAM:
CHEST - 2 VIEW

[Series 1: dg chest 2 view · 0.14mm/px · 2 of 2 slices shown]
[im 1/2]
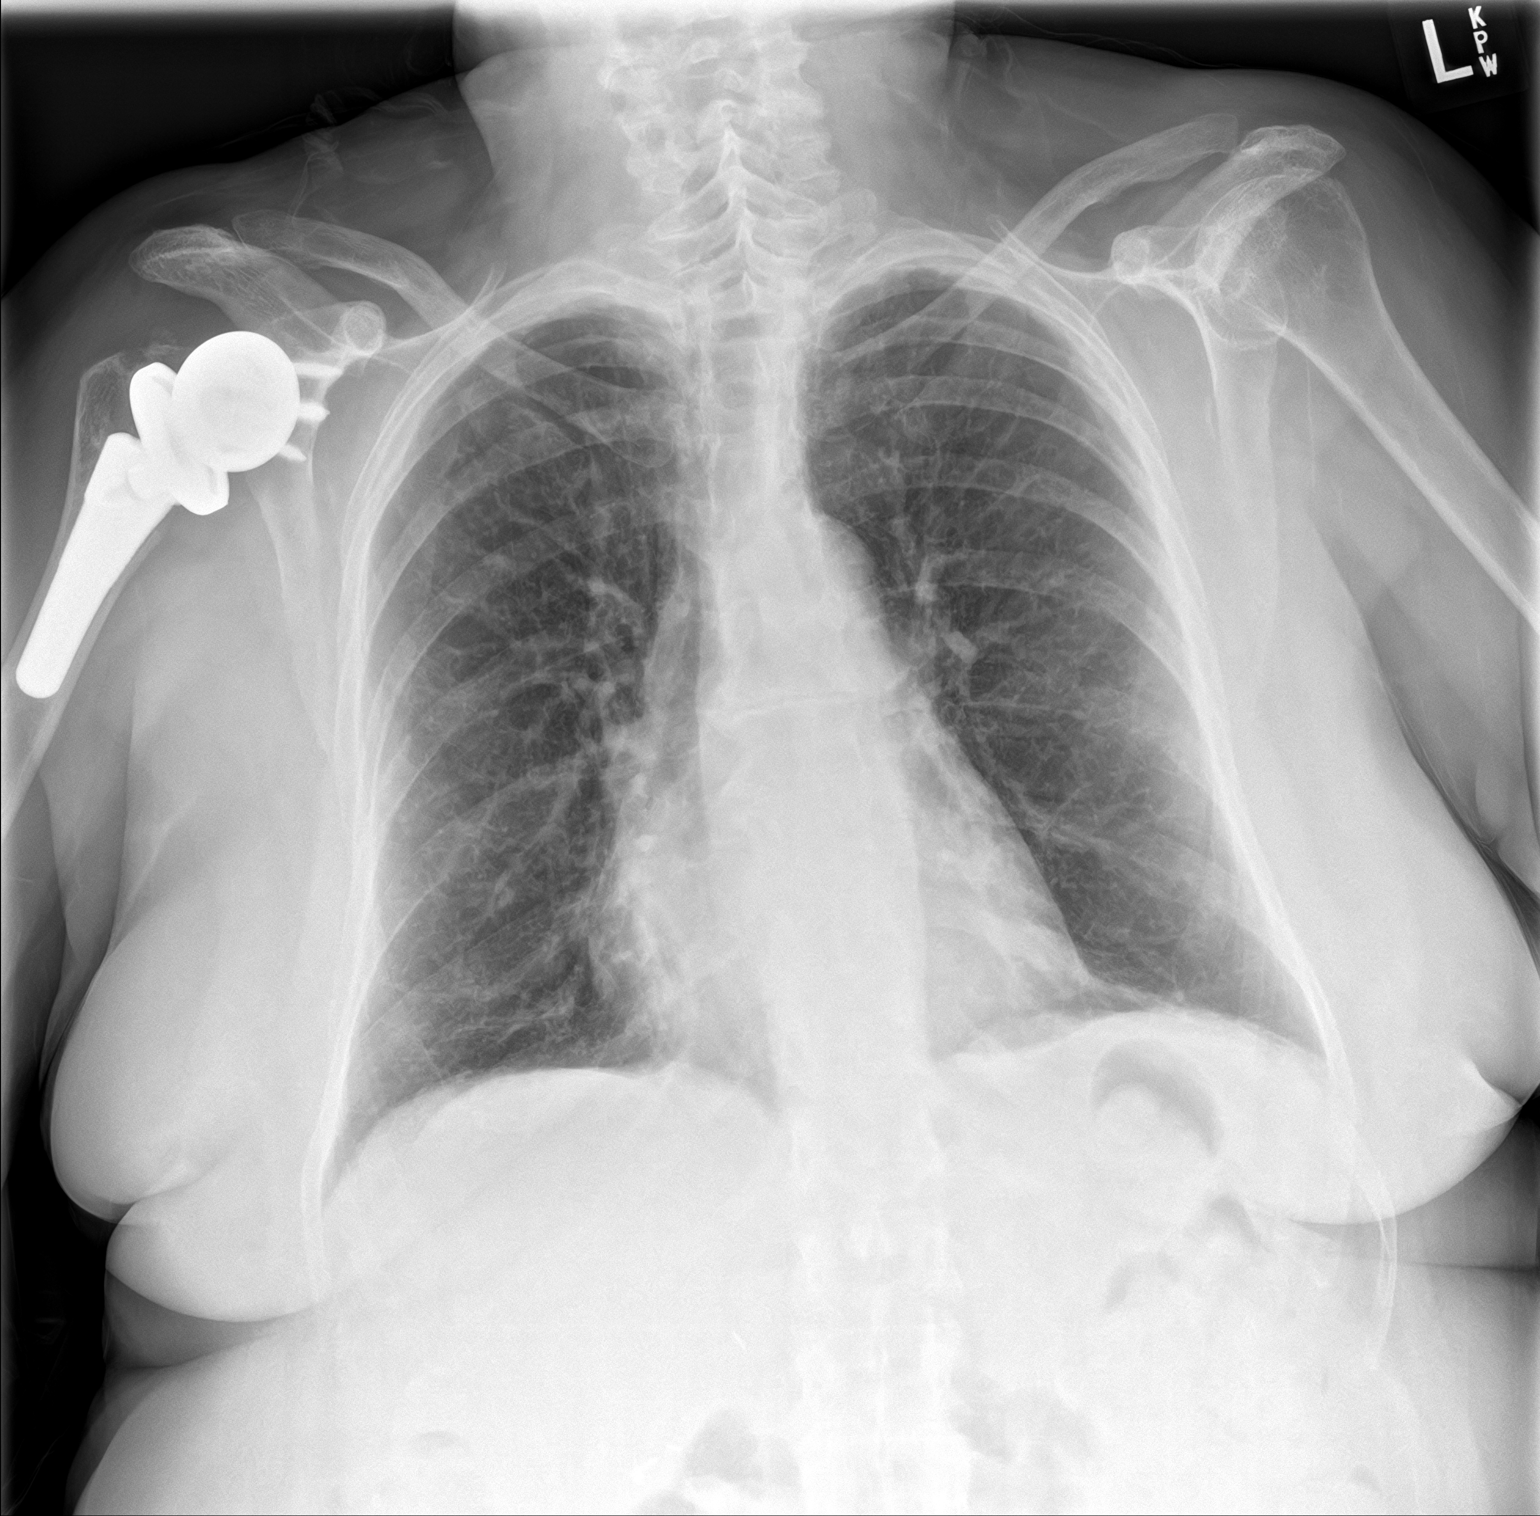
[im 2/2]
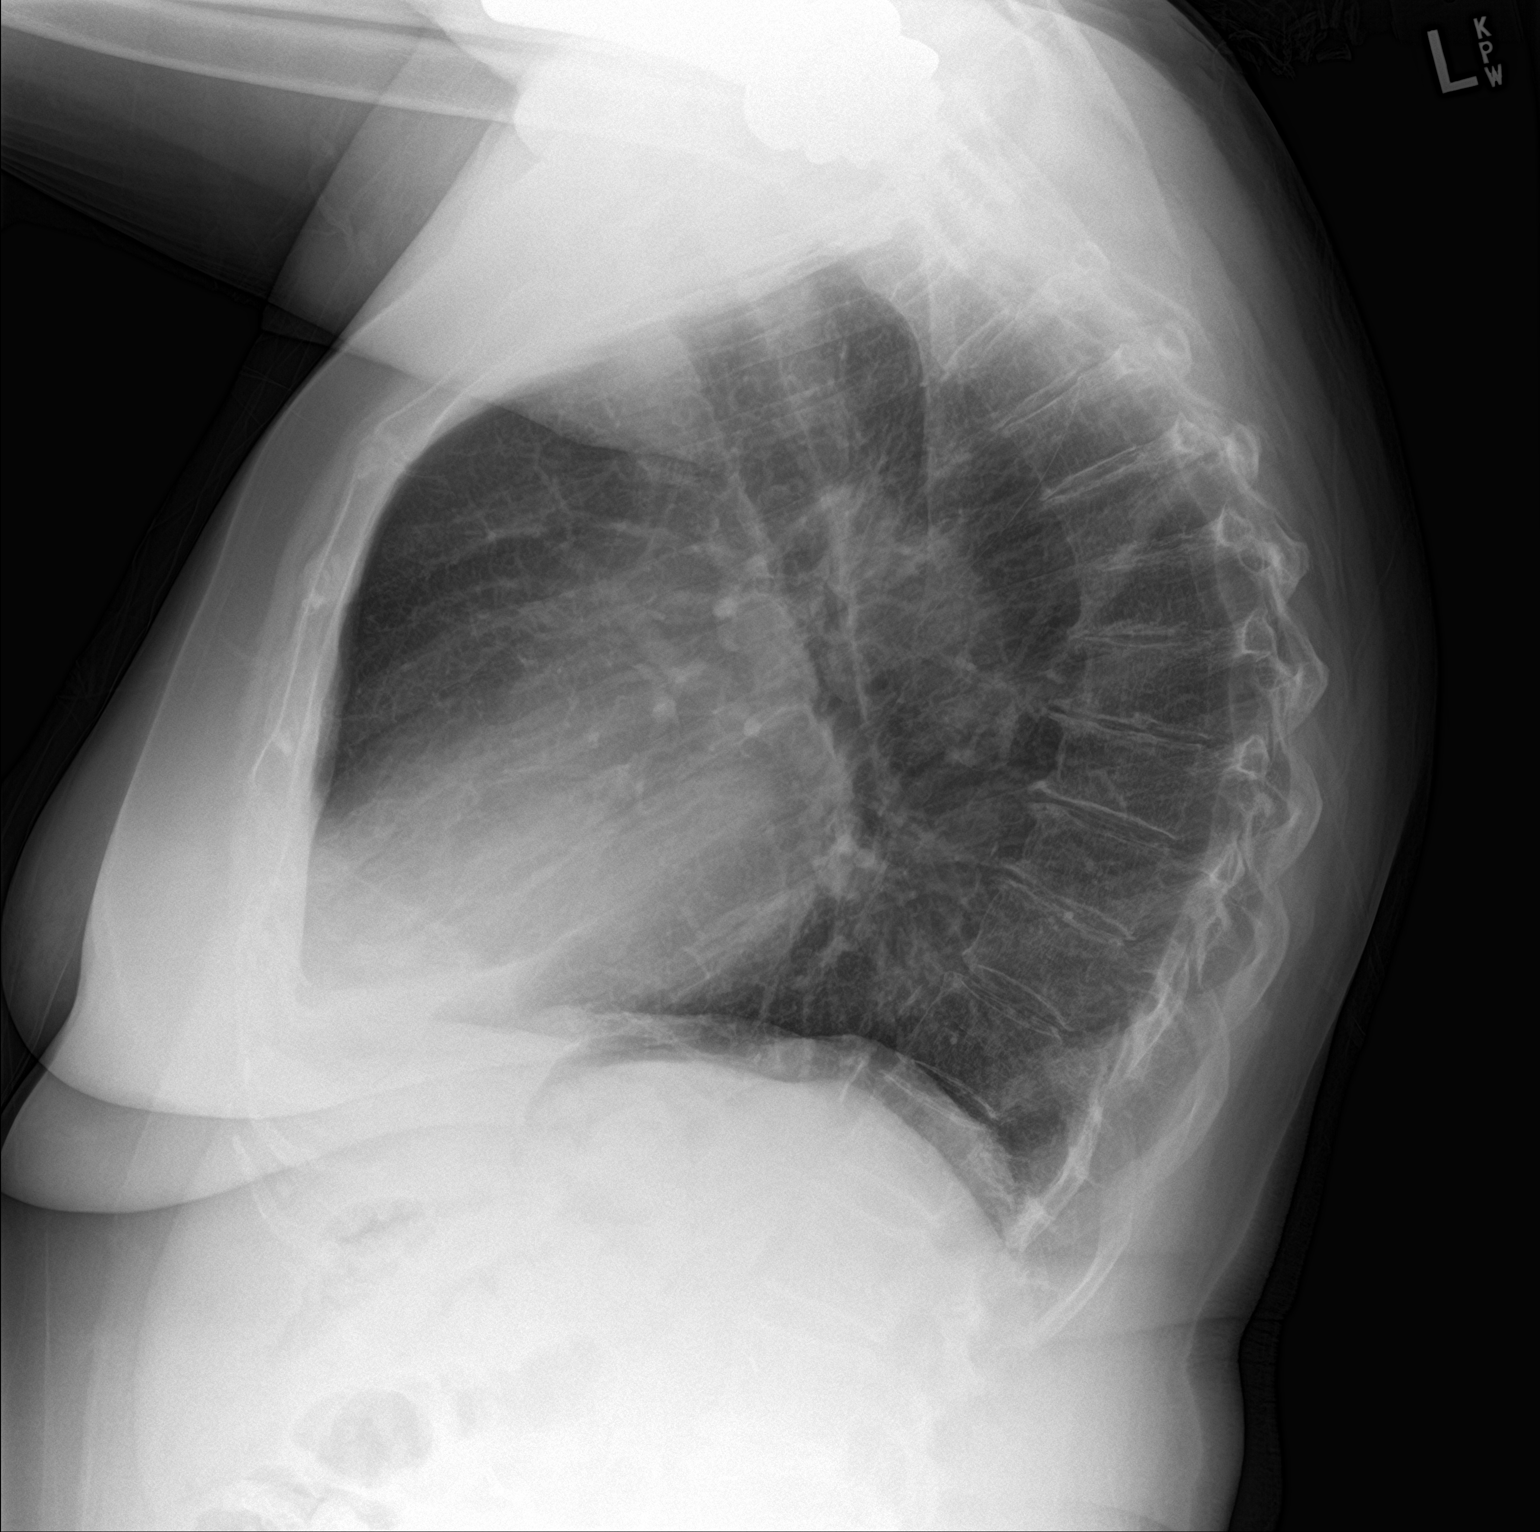

[2 of 2 positions shown; findings below may reference images not displayed]

FINDINGS: Lungs are clear.

Heart size and mediastinal contours are within normal limits. Aortic
Atherosclerosis ([id number]-170.0).

No effusion.

right shoulder arthroplasty components stable.
IMPRESSION: 1. No acute cardiopulmonary disease.

## 2019-07-13 MED ORDER — LAMOTRIGINE 100 MG PO TABS: 100.0000 mg | ORAL_TABLET | Freq: Two times a day (BID) | ORAL | 0 refills | Status: DC

## 2019-07-13 MED ORDER — ALPRAZOLAM 0.25 MG PO TABS: 0.2500 mg | ORAL_TABLET | Freq: Every evening | ORAL | 0 refills | Status: AC | PRN

## 2019-07-13 MED ORDER — ESCITALOPRAM OXALATE 10 MG PO TABS: 10.0000 mg | ORAL_TABLET | Freq: Every day | ORAL | 0 refills | Status: DC

## 2019-07-13 MED ORDER — QUETIAPINE FUMARATE 25 MG PO TABS: 25.0000 mg | ORAL_TABLET | Freq: Every day | ORAL | 0 refills | Status: DC

## 2019-07-13 NOTE — Telephone Encounter (Signed)
Patient calling back and has been scheduled for 12/01/19 at 10:00 am for fasting labs. Then scheduled for 12/08/19 at 1:30 pm in office for a follow up appointment.

## 2019-07-13 NOTE — Progress Notes (Signed)
[place] MD OP Progress Note  Virtual Visit via Telephone Note  I connected with Kerri Carter on 07/13/19 at 11:00 AM EDT by telephone and verified that I am speaking with the correct person using two identifiers.  Location: Patient: home Provider: Clinic   I discussed the limitations, risks, security and privacy concerns of performing an evaluation and management service by telephone and the availability of in person appointments. I also discussed with the patient that there may be a patient responsible charge related to this service. The patient expressed understanding and agreed to proceed.   I provided 13 minutes of non-face-to-face time during this encounter.    07/13/2019 11:15 AM Kerri Carter  MRN:  [id number]  Chief Complaint:  " I have been through very testing times."  HPI: Patient spoke about how she misses her husband who passed away in March 23, 2022 due to [id number] related complications.  She stated that things have been really difficult however she is holding on.  She stated that her mood has been stable for the most part.  She informed that she has not had any frequent falls except for one fall about 3 weeks ago when she tripped over after losing her balance.  She has been able to sleep well with the help of melatonin.  She has been taking her other medications regularly including Lamictal, Lexapro, Seroquel.   Her grandchildren are still living with her and helping her out. She stated that she would like to be seen in the office for next appointment.  I offered her an appointment with me in my [place] office location however patient cited that transportation will be a challenge. She was agreeable to seeing Dr. Shea Evans in [place] office location.  I informed her that I will contact the office for future appointment.  Visit Diagnosis:    ICD-10-CM   1. Bipolar disorder, in full remission, most recent episode depressed ([place])  F31.76   2. Anxiety  F41.9     Past Psychiatric  History: Bipolar d/o, anxiety  Past Medical History:  Past Medical History:  Diagnosis Date  . Anxiety   . Asthma   . Bipolar disorder ([place])   . Cataract    s/p b/l repair   . Chicken pox   . CKD (chronic kidney disease)   . CKD (chronic kidney disease), stage III    a. s/p R nephrectomy.  . Conversion disorder   . COPD (chronic obstructive pulmonary disease) ([place])   . Depression   . Essential hypertension   . GERD (gastroesophageal reflux disease)   . Hyperlipidemia   . Inflammatory arthritis    a. hands/carpal tunnel.  b. Low titer rheumatoid factor. c. Negative anti-CCP antibodies. d. Plaquenil.  . Non-Obstructive CAD    a. 07/2009 Cath ([facility]): nonobs dzs;  b. 03/2011 Cath [facility]): nonobs dzs.  . Osteoarthritis    a. Knees.  . PUD (peptic ulcer disease)   . S/P right hip fracture    11/01/16 s/p repair  . Shoulder pain   . Sleep apnea    no cpap  . Spinal stenosis at L4-L5 level    severe with L4/L5 anterolisthesis grade 1 anterolisthesis   . Toxic maculopathy   . Valvular heart disease    a. 07/2015 Echo: EF 55-60%, Mild AI, AS, MR, and TR.    Past Surgical History:  Procedure Laterality Date  . APPENDECTOMY    . BACK SURGERY    . BUNIONECTOMY Right   . CATARACT EXTRACTION, BILATERAL    .  CESAREAN SECTION     x1  . CHOLECYSTECTOMY N/A 05/11/2016   Procedure: LAPAROSCOPIC CHOLECYSTECTOMY;  Surgeon: Florene Glen, MD;  Location: [facility] ORS;  Service: General;  Laterality: N/A;  . COLONOSCOPY WITH PROPOFOL N/A 04/02/2016   Procedure: COLONOSCOPY WITH PROPOFOL;  Surgeon: Jonathon Bellows, MD;  Location: [facility] ENDOSCOPY;  Service: Endoscopy;  Laterality: N/A;  . ENDOSCOPIC RETROGRADE CHOLANGIOPANCREATOGRAPHY (ERCP) WITH PROPOFOL N/A 05/08/2016   Procedure: ENDOSCOPIC RETROGRADE CHOLANGIOPANCREATOGRAPHY (ERCP) WITH PROPOFOL;  Surgeon: Lucilla Lame, MD;  Location: [facility] ENDOSCOPY;  Service: Endoscopy;  Laterality: N/A;  . ERCP     with biliary spincterotomy 05/08/16 Dr. Allen Norris for  choledocholithiasis   . ESOPHAGEAL DILATION  04/02/2016   Procedure: ESOPHAGEAL DILATION;  Surgeon: Jonathon Bellows, MD;  Location: [facility] ENDOSCOPY;  Service: Endoscopy;;  . ESOPHAGOGASTRODUODENOSCOPY (EGD) WITH PROPOFOL N/A 04/02/2016   Procedure: ESOPHAGOGASTRODUODENOSCOPY (EGD) WITH PROPOFOL;  Surgeon: Jonathon Bellows, MD;  Location: [facility] ENDOSCOPY;  Service: Endoscopy;  Laterality: N/A;  . HIP ARTHROPLASTY Right 11/01/2016   Procedure: ARTHROPLASTY BIPOLAR HIP (HEMIARTHROPLASTY);  Surgeon: Corky Mull, MD;  Location: [facility] ORS;  Service: Orthopedics;  Laterality: Right;  . 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 osteoporosis     noted DEXA 08/19/16   . REPLACEMENT TOTAL KNEE Right   . REVERSE SHOULDER ARTHROPLASTY Right 11/04/2017   Procedure: REVERSE SHOULDER ARTHROPLASTY;  Surgeon: Corky Mull, MD;  Location: [facility] ORS;  Service: Orthopedics;  Laterality: Right;  . REVERSE SHOULDER ARTHROPLASTY Left 07/26/2018   Procedure: REVERSE SHOULDER ARTHROPLASTY;  Surgeon: Corky Mull, MD;  Location: [facility] ORS;  Service: Orthopedics;  Laterality: Left;  . TONSILLECTOMY    . TOTAL HIP ARTHROPLASTY  12/10/11   [facility] left hip  . TOTAL HIP ARTHROPLASTY Bilateral   . TUBAL LIGATION      Family Psychiatric History: see below  Family History:  Family History  Problem Relation Age of Onset  . Rheum arthritis Mother   . Asthma Mother   . Parkinson's disease Mother   . Heart disease Mother   . Stroke Mother   . Hypertension Mother   . Heart attack Father   . Heart disease Father   . Hypertension Father   . Peripheral Artery Disease Father   . Diabetes Son   . Gout Son   . Asthma Sister   . Heart disease Sister   . Lung cancer Sister   . Heart disease Sister   . Heart disease Sister   . Breast cancer Sister   . Heart attack Sister   . Heart disease Brother   . Heart disease Maternal Grandmother   . Diabetes Maternal Grandmother   . Colon cancer  Maternal Grandmother   . Cancer Maternal Grandmother        Hodgkins lymphoma  . Heart disease Brother   . Alcohol abuse Brother   . Depression Brother   . Dementia Son     Social History:  Social History   Socioeconomic History  . Marital status: Widowed    Spouse name: richard  . Number of children: 2  . Years of education: Some Coll  . Highest education level: Some college, no degree  Occupational History  . Occupation: Retired    Comment: retired  Tobacco Use  . Smoking status: Former Smoker    Packs/day: 0.50    Years: 20.00    Pack years: 10.00    Types: Cigarettes    Quit date:  02/02/1974    Years since quitting: 45.4  . Smokeless tobacco: Never Used  Vaping Use  . Vaping Use: Never used  Substance and Sexual Activity  . Alcohol use: No  . Drug use: No  . Sexual activity: Not Currently  Other Topics Concern  . Not on file  Social History Narrative   Lives at home in [place] grandson, his wife and their child.   Husband Desia Saban died covid 79 late 1/2058married x 63 years   Right-handed.   6 cups coffee per day.   Social Determinants of Health   Financial Resource Strain: Low Risk   . Difficulty of Paying Living Expenses: Not hard at all  Food Insecurity: No Food Insecurity  . Worried About Charity fundraiser in the Last Year: Never true  . Ran Out of Food in the Last Year: Never true  Transportation Needs:   . Lack of Transportation (Medical):   Marland Kitchen Lack of Transportation (Non-Medical):   Physical Activity:   . Days of Exercise per Week:   . Minutes of Exercise per Session:   Stress:   . Feeling of Stress :   Social Connections:   . Frequency of Communication with Friends and Family:   . Frequency of Social Gatherings with Friends and Family:   . Attends Religious Services:   . Active Member of Clubs or Organizations:   . Attends Archivist Meetings:   Marland Kitchen Marital Status:     Allergies:  Allergies  Allergen Reactions  .  Ceftin [Cefuroxime Axetil] Anaphylaxis  . Lisinopril Anaphylaxis  . Morphine Other (See Comments)    Per patient, low blood pressure issues that requires action to raise it back up. Can take small infrequent doses  . Sulfasalazine Anaphylaxis  . Aspirin Other (See Comments)    Sulfasalazine allergy cross reacts  . Antihistamines, Chlorpheniramine-Type Other (See Comments)    Makes pt hyper  . Antivert [Meclizine Hcl] Other (See Comments)    Bladder will not empty  . Decongestant [Pseudoephedrine Hcl] Other (See Comments)    Makes pt hyper  . Doxycycline Other (See Comments)    GI upset  . Polymyxin B Other (See Comments)    Medication was in eye drops.  . Sulfa Antibiotics Other (See Comments)    Face swelling  . Xarelto [Rivaroxaban] Other (See Comments)    Stomach burning, bleeding, and tar in stool  . Adhesive [Tape] Rash  . Iodine Hives and Rash    Per patient allergy is to contrast dye only, she is able to use betadine scrubs.  [place] [Levofloxacin In D5w] Rash  . Tetanus Toxoids Rash and Other (See Comments)    Fever and hot to touch at injection site    Metabolic Disorder Labs: Lab Results  Component Value Date   HGBA1C 6.1 (H) 12/01/2018   MPG 128.37 12/01/2018   MPG 114 08/06/2017   No results found for: PROLACTIN Lab Results  Component Value Date   CHOL 147 12/01/2018   TRIG 227 (H) 12/01/2018   HDL 42 12/01/2018   CHOLHDL 3.5 12/01/2018   VLDL 45 (H) 12/01/2018   LDLCALC 60 12/01/2018   LDLCALC 86 08/06/2017   Lab Results  Component Value Date   TSH 1.734 12/01/2018   TSH 2.72 08/06/2017    Therapeutic Level Labs: No results found for: LITHIUM No results found for: VALPROATE No components found for:  CBMZ  Current Medications: Current Outpatient Medications  Medication Sig Dispense Refill  .  Acetylcysteine 600 MG CAPS Take 1 capsule (600 mg total) by mouth 2 (two) times daily with a meal. (Patient not taking: Reported on 06/16/2019) 60  capsule 2  . Adalimumab (HUMIRA PEN) 40 MG/0.4ML PNKT Inject 40 mg into the skin every 14 (fourteen) days.     Marland Kitchen albuterol (VENTOLIN HFA) 108 (90 Base) MCG/ACT inhaler Inhale 2 puffs into the lungs every 6 (six) hours as needed. 1 Inhaler 5  . ALPRAZolam (XANAX) 0.25 MG tablet Take 1 tablet (0.25 mg total) by mouth at bedtime as needed for anxiety. 90 tablet 0  . amLODipine (NORVASC) 2.5 MG tablet Take 2.5 mg by mouth daily.    . budesonide (PULMICORT) 0.25 MG/2ML nebulizer solution Take 2 mLs (0.25 mg total) by nebulization 2 (two) times daily. 150 mL 11  . BYSTOLIC 10 MG tablet TAKE 1 TABLET BY MOUTH EVERY DAY 30 tablet 2  . clotrimazole (LOTRIMIN) 1 % cream Apply 1 application topically 2 (two) times daily. prn (Patient not taking: Reported on 06/16/2019) 60 g 11  . dicyclomine (BENTYL) 10 MG capsule TAKE 1 CAPSULE (10 MG TOTAL) BY MOUTH 4 (FOUR) TIMES DAILY - BEFORE MEALS AND AT BEDTIME. 360 capsule 3  . escitalopram (LEXAPRO) 10 MG tablet Take 1 tablet (10 mg total) by mouth daily. 90 tablet 0  . famotidine (PEPCID) 20 MG tablet Take 1 tablet (20 mg total) by mouth daily. Before breakfast or dinner 90 tablet 3  . fluticasone (FLONASE) 50 MCG/ACT nasal spray Place 1 spray into both nostrils 2 (two) times daily. 16 g 0  . gabapentin (NEURONTIN) 300 MG capsule Take 2-3 capsules (600-900 mg total) by mouth See admin instructions. Take 600 mg by mouth in the morning and 900 mg at bedtime 450 capsule 1  . ipratropium-albuterol (DUONEB) 0.5-2.5 (3) MG/3ML SOLN Take 3 mLs by nebulization 3 (three) times daily as needed. 360 mL 3  . lamoTRIgine (LAMICTAL) 100 MG tablet Take 1 tablet (100 mg total) by mouth 2 (two) times daily. 180 tablet 0  . leflunomide (ARAVA) 20 MG tablet Take 1 tablet (20 mg total) by mouth daily.    Marland Kitchen lovastatin (MEVACOR) 40 MG tablet Take 1 tablet (40 mg total) by mouth at bedtime. 90 tablet 3  . montelukast (SINGULAIR) 10 MG tablet Take 1 tablet (10 mg total) by mouth daily. 90  tablet 3  . multivitamin-lutein (OCUVITE-LUTEIN) CAPS capsule Take 1 capsule by mouth 2 (two) times daily.    Marland Kitchen MYRBETRIQ 50 MG TB24 tablet TAKE 1 TABLET BY MOUTH EVERYDAY AT BEDTIME 30 tablet 5  . oxyCODONE-acetaminophen (PERCOCET) 5-325 MG tablet Take 1 tablet by mouth 2 (two) times daily as needed for severe pain. (Patient not taking: Reported on 06/16/2019) 30 tablet 0  . pantoprazole (PROTONIX) 40 MG tablet Take 1 tablet (40 mg total) by mouth 2 (two) times daily. 30 minutes before food. Note reduction in frequency 180 tablet 3  . QUEtiapine (SEROQUEL) 25 MG tablet Take 1 tablet (25 mg total) by mouth at bedtime. 90 tablet 0  . sucralfate (CARAFATE) 1 g tablet Take 1 tablet (1 g total) by mouth 4 (four) times daily. TAKE 1 TABLET BY MOUTH 4 TIMES A DAY WITH MEALS AND AT BEDTIME 360 tablet 3   No current facility-administered medications for this visit.     Psychiatric Specialty Exam: Review of Systems  There were no vitals taken for this visit.There is no height or weight on file to calculate BMI.  General Appearance: unable to assess  due to phone visit   Eye Contact:  unable to assess due to phone visit  Speech:  Clear and Coherent and Normal Rate  Volume:  Normal  Mood:  Euthymic  Affect:  Congruent  Thought Process:  Goal Directed, Linear and Descriptions of Associations: Intact  Orientation:  Full (Time, Place, and Person)  Thought Content: Logical   Suicidal Thoughts:  No  Homicidal Thoughts:  No  Memory:  Recent;   Good Remote;   Good  Judgement:  Fair  Insight:  Fair  Psychomotor Activity:  Normal  Concentration:  Concentration: Good and Attention Span: Good  Recall:  Good  Fund of Knowledge: Good  Language: Good  Akathisia:  Negative  Handed:  Right  AIMS (if indicated): not done   Assets:  Communication Skills Desire for Improvement Financial Resources/Insurance Housing Social Support  ADL's:  Intact  Cognition: WNL  Sleep:  Fair with help of Melatonin    Screenings: GAD-7     Office Visit from 09/05/2015 in [place]  Total GAD-7 Score 18    Mini-Mental     Clinical Support from 03/01/2017 in [place] Office Visit from 02/28/2016 in [facility] [place] from 07/11/2015 in [facility]  Total Score (max 30 points ) 30 30 27     PHQ2-9     Office Visit from 03/16/2019 in [place] from 11/23/2018 in [facility] Office Visit from 07/07/2018 in [place] Office Visit from 02/24/2018 in [place] Office Visit from 10/19/2017 in [place]  PHQ-2 Total Score 4 0 0 0 0  PHQ-9 Total Score 16 -- -- -- --       Assessment and Plan: Patient appears to be stable on current regimen.  She would like to be seen face-to-face for her next encounter.  She is unable to come to [place] due to transportation issues.  She was informed that I will contact the [facility] clinic office to schedule her an appointment with Dr. Shea Evans for future appointments.  1. Bipolar disorder, in full remission, most recent episode depressed ([facility])  - lamoTRIgine (LAMICTAL) 100 MG tablet; Take 1 tablet (100 mg total) by mouth 2 (two) times daily.  Dispense: 180 tablet; Refill: 0 - QUEtiapine (SEROQUEL) 25 MG tablet; Take 1 tablet (25 mg total) by mouth at bedtime.  Dispense: 90 tablet; Refill: 0 - escitalopram (LEXAPRO) 10 MG tablet; Take 1 tablet (10 mg total) by mouth daily.  Dispense: 90 tablet; Refill: 0  2. Anxiety  - ALPRAZolam (XANAX) 0.25 MG tablet; Take 1 tablet (0.25 mg total) by mouth at bedtime as needed for anxiety.  Dispense: 90 tablet; Refill: 0  Continue same regimen for now. F/up in 3 months.  [place] Crane, MD 07/13/2019, 11:15 AM

## 2019-07-20 ENCOUNTER — Other Ambulatory Visit: Payer: Self-pay | Admitting: Internal Medicine

## 2019-07-20 DIAGNOSIS — I129 Hypertensive chronic kidney disease with stage 1 through stage 4 chronic kidney disease, or unspecified chronic kidney disease: Secondary | ICD-10-CM | POA: Diagnosis not present

## 2019-07-20 DIAGNOSIS — M8008XD Age-related osteoporosis with current pathological fracture, vertebra(e), subsequent encounter for fracture with routine healing: Secondary | ICD-10-CM | POA: Diagnosis not present

## 2019-07-20 DIAGNOSIS — J449 Chronic obstructive pulmonary disease, unspecified: Secondary | ICD-10-CM | POA: Diagnosis not present

## 2019-07-20 DIAGNOSIS — F4329 Adjustment disorder with other symptoms: Secondary | ICD-10-CM | POA: Diagnosis not present

## 2019-07-20 DIAGNOSIS — H353122 Nonexudative age-related macular degeneration, left eye, intermediate dry stage: Secondary | ICD-10-CM | POA: Diagnosis not present

## 2019-07-20 DIAGNOSIS — D3131 Benign neoplasm of right choroid: Secondary | ICD-10-CM | POA: Diagnosis not present

## 2019-07-20 DIAGNOSIS — F319 Bipolar disorder, unspecified: Secondary | ICD-10-CM | POA: Diagnosis not present

## 2019-07-20 DIAGNOSIS — I2510 Atherosclerotic heart disease of native coronary artery without angina pectoris: Secondary | ICD-10-CM | POA: Diagnosis not present

## 2019-07-20 DIAGNOSIS — F419 Anxiety disorder, unspecified: Secondary | ICD-10-CM | POA: Diagnosis not present

## 2019-07-20 DIAGNOSIS — H43813 Vitreous degeneration, bilateral: Secondary | ICD-10-CM | POA: Diagnosis not present

## 2019-07-20 DIAGNOSIS — M48061 Spinal stenosis, lumbar region without neurogenic claudication: Secondary | ICD-10-CM | POA: Diagnosis not present

## 2019-07-20 DIAGNOSIS — S41111D Laceration without foreign body of right upper arm, subsequent encounter: Secondary | ICD-10-CM | POA: Diagnosis not present

## 2019-07-20 DIAGNOSIS — J309 Allergic rhinitis, unspecified: Secondary | ICD-10-CM

## 2019-07-20 DIAGNOSIS — H353211 Exudative age-related macular degeneration, right eye, with active choroidal neovascularization: Secondary | ICD-10-CM | POA: Diagnosis not present

## 2019-07-20 MED ORDER — MONTELUKAST SODIUM 10 MG PO TABS: 10.0000 mg | ORAL_TABLET | Freq: Every day | ORAL | 3 refills | Status: DC

## 2019-07-21 DIAGNOSIS — M0579 Rheumatoid arthritis with rheumatoid factor of multiple sites without organ or systems involvement: Secondary | ICD-10-CM | POA: Diagnosis not present

## 2019-07-21 DIAGNOSIS — Z79899 Other long term (current) drug therapy: Secondary | ICD-10-CM | POA: Diagnosis not present

## 2019-07-21 DIAGNOSIS — Z20822 Contact with and (suspected) exposure to covid-19: Secondary | ICD-10-CM | POA: Diagnosis not present

## 2019-07-21 DIAGNOSIS — Z03818 Encounter for observation for suspected exposure to other biological agents ruled out: Secondary | ICD-10-CM | POA: Diagnosis not present

## 2019-07-24 ENCOUNTER — Telehealth: Payer: Self-pay | Admitting: Internal Medicine

## 2019-07-24 DIAGNOSIS — E781 Pure hyperglyceridemia: Secondary | ICD-10-CM | POA: Insufficient documentation

## 2019-07-24 NOTE — Telephone Encounter (Signed)
Advise pt she was due to see [facility] in [place] 05/2019  Please call for an appt  Fax this note to [facility] derm so they can reach out to patient as well   Thanks   12/01/2018 Office Visit [facility]  [address]  [place], [place] [address]  [phone]  Keith Rake, MD  [address]  [place] [phone]  [facility]  [place], [place] [address]  [phone]  [phone] (Fax)  Personal history of malignant neoplasm of skin (Primary Dx);  Seborrheic keratoses

## 2019-07-25 IMAGING — DX LEFT SHOULDER - 1 VIEW
2 series · 2 of 2 positions shown · non-contrast
Comparison: MRI 01/05/2018

CLINICAL DATA: Postop left shoulder.

EXAM:
LEFT SHOULDER - 1 VIEW

[shoulder ap]
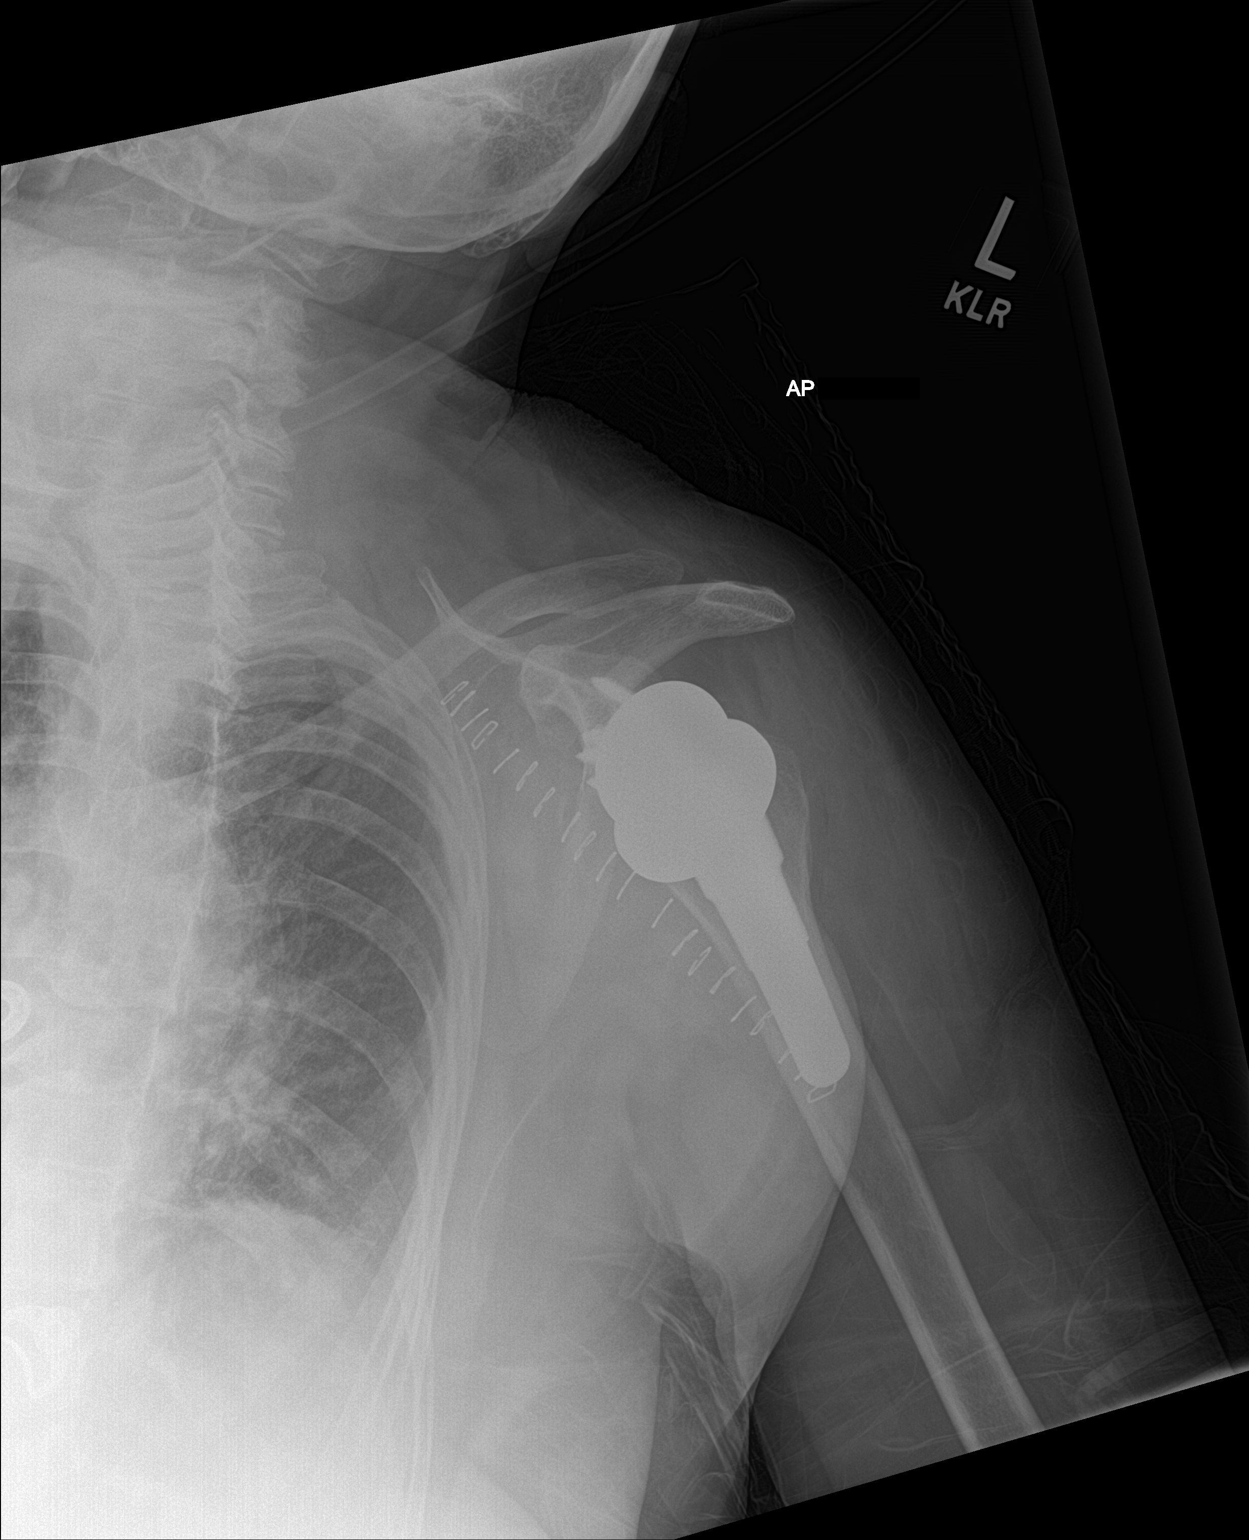

[shoulder obl]
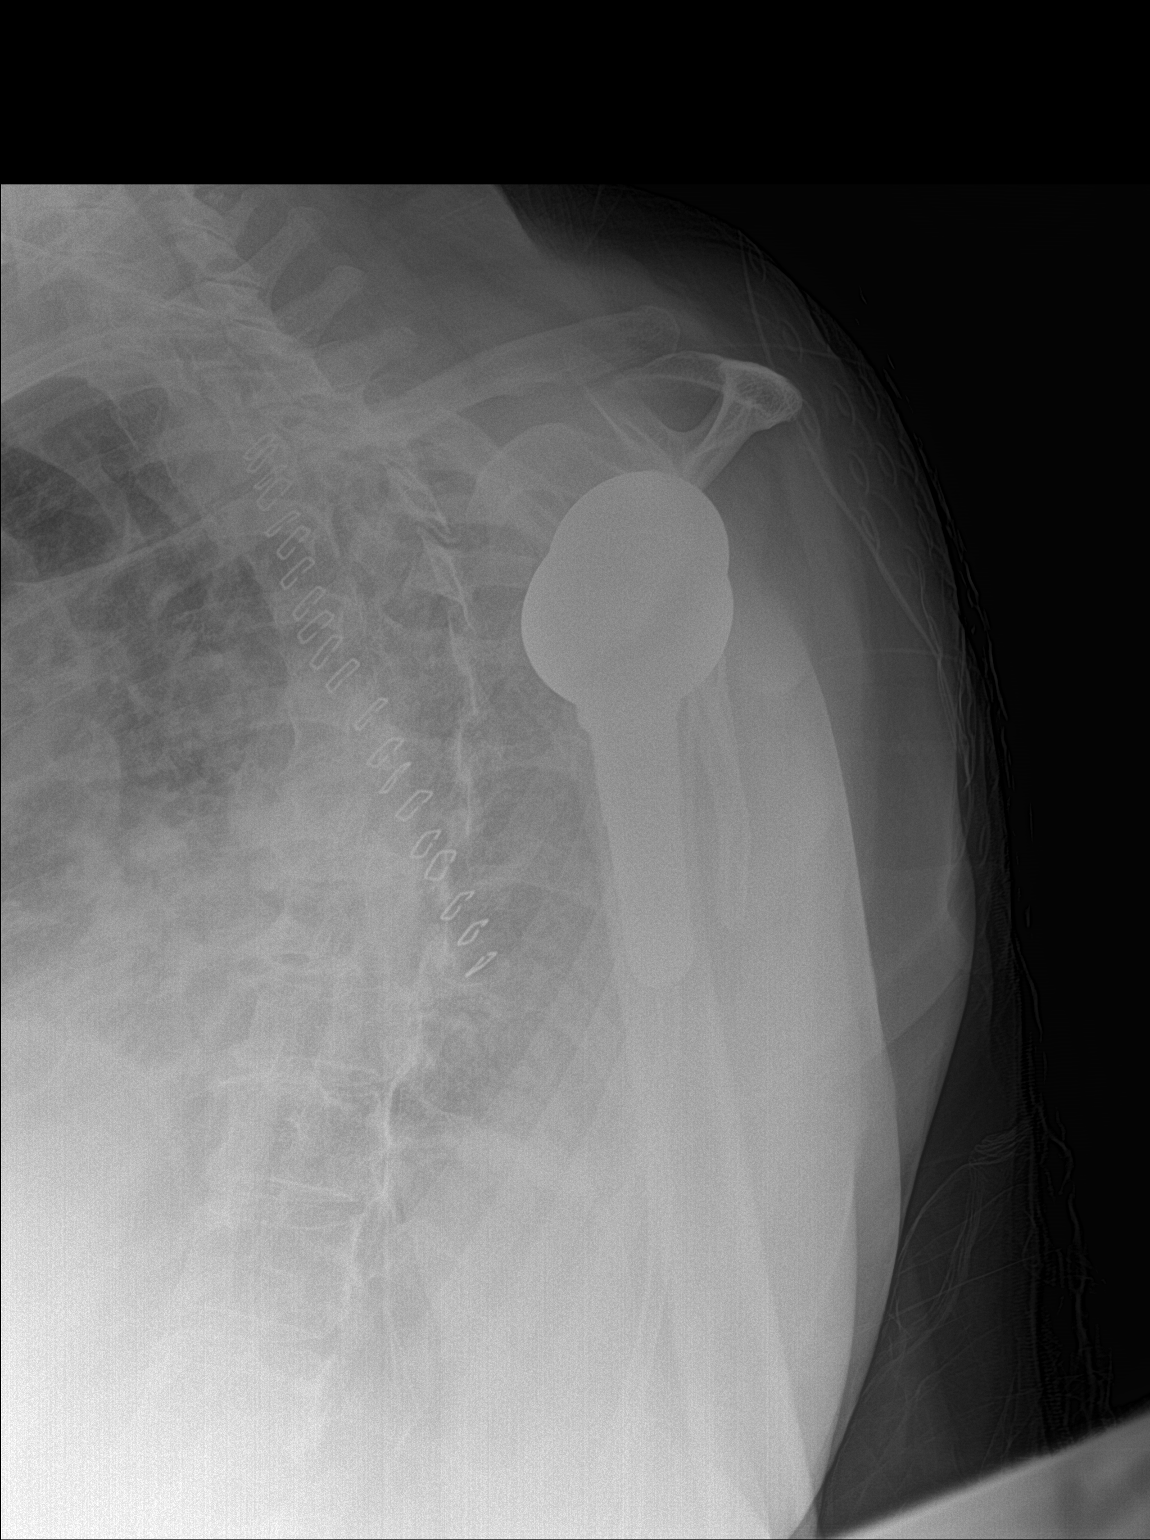

[2 of 2 positions shown; findings below may reference images not displayed]

FINDINGS: Total left shoulder replacement. Hardware intact. Anatomic
alignment. No acute bony abnormality.
IMPRESSION: Total left shoulder replacement anatomic alignment.

## 2019-07-27 NOTE — Telephone Encounter (Signed)
Patient calling back in and was informed. States she does not want to be seen by [facility] dermatology and does not want to be seen in [place] either.   Patient is requesting a new referral to another dermatologist in [place]. Patient aware that Dr Olivia Mackie McLean-Scocuzza is out of office until 07/31/19. She is willing to wait.

## 2019-07-27 NOTE — Telephone Encounter (Signed)
Left message to return call 

## 2019-07-31 NOTE — Telephone Encounter (Signed)
Referred to Dr. Kellie Moor place referral please   [place]

## 2019-07-31 NOTE — Addendum Note (Signed)
Addended by: Orland Mustard on: 07/31/2019 07:34 PM   Modules accepted: Orders

## 2019-08-03 ENCOUNTER — Ambulatory Visit: Payer: Medicare PPO | Admitting: Internal Medicine

## 2019-08-10 ENCOUNTER — Ambulatory Visit: Payer: Medicare HMO | Admitting: Podiatry

## 2019-08-11 ENCOUNTER — Telehealth: Payer: Self-pay | Admitting: Internal Medicine

## 2019-08-11 NOTE — Telephone Encounter (Signed)
Pt called in and stated that the referral that was given to her Dr.isenstein is booked up until January she said she need someone now

## 2019-08-14 ENCOUNTER — Telehealth: Payer: Self-pay | Admitting: Internal Medicine

## 2019-08-14 ENCOUNTER — Ambulatory Visit: Payer: Medicare PPO | Admitting: Podiatry

## 2019-08-14 NOTE — Telephone Encounter (Signed)
Pt called said she was calling about  a letter she received

## 2019-08-15 NOTE — Telephone Encounter (Signed)
Multiple referrals have been placed for the Patient. Informed that most of the Dermatology appointment are a ways out. She will call to be placed on the cancellation list.   Given number to call: Phone: [phone]

## 2019-08-15 NOTE — Telephone Encounter (Signed)
Letter was sent due to missed appointment 08/03/19. Patient has been rescheduled to come in 08/29/19 at 1:30 pm.

## 2019-08-20 ENCOUNTER — Other Ambulatory Visit: Payer: Self-pay | Admitting: Internal Medicine

## 2019-08-20 DIAGNOSIS — R059 Cough, unspecified: Secondary | ICD-10-CM

## 2019-08-20 MED ORDER — GABAPENTIN 300 MG PO CAPS: 600.0000 mg | ORAL_CAPSULE | ORAL | 3 refills | Status: DC

## 2019-08-21 DIAGNOSIS — Z905 Acquired absence of kidney: Secondary | ICD-10-CM | POA: Diagnosis not present

## 2019-08-21 DIAGNOSIS — N1832 Chronic kidney disease, stage 3b: Secondary | ICD-10-CM | POA: Diagnosis not present

## 2019-08-21 DIAGNOSIS — N2581 Secondary hyperparathyroidism of renal origin: Secondary | ICD-10-CM | POA: Diagnosis not present

## 2019-08-25 ENCOUNTER — Other Ambulatory Visit: Payer: Self-pay | Admitting: Surgery

## 2019-08-25 DIAGNOSIS — S42125K Nondisplaced fracture of acromial process, left shoulder, subsequent encounter for fracture with nonunion: Secondary | ICD-10-CM | POA: Diagnosis not present

## 2019-08-25 DIAGNOSIS — Z96612 Presence of left artificial shoulder joint: Secondary | ICD-10-CM | POA: Diagnosis not present

## 2019-08-25 DIAGNOSIS — M7071 Other bursitis of hip, right hip: Secondary | ICD-10-CM | POA: Diagnosis not present

## 2019-08-25 DIAGNOSIS — S76011A Strain of muscle, fascia and tendon of right hip, initial encounter: Secondary | ICD-10-CM | POA: Diagnosis not present

## 2019-08-25 DIAGNOSIS — M25551 Pain in right hip: Secondary | ICD-10-CM | POA: Diagnosis not present

## 2019-08-25 DIAGNOSIS — M25511 Pain in right shoulder: Secondary | ICD-10-CM | POA: Diagnosis not present

## 2019-08-25 DIAGNOSIS — Z96611 Presence of right artificial shoulder joint: Secondary | ICD-10-CM | POA: Diagnosis not present

## 2019-08-25 DIAGNOSIS — S42121G Displaced fracture of acromial process, right shoulder, subsequent encounter for fracture with delayed healing: Secondary | ICD-10-CM | POA: Diagnosis not present

## 2019-08-25 DIAGNOSIS — M25512 Pain in left shoulder: Secondary | ICD-10-CM | POA: Diagnosis not present

## 2019-08-27 ENCOUNTER — Telehealth: Payer: Self-pay | Admitting: Internal Medicine

## 2019-08-27 NOTE — Telephone Encounter (Signed)
Is pt agreeable to reduce lovastatin since she is on Arava (for rheumatoid arthritis)   Reduce lovastatin to 20 mg is recommended  Let me know   Dr. Gayland Curry

## 2019-08-28 ENCOUNTER — Other Ambulatory Visit: Payer: Self-pay

## 2019-08-28 ENCOUNTER — Ambulatory Visit: Payer: Medicare PPO | Admitting: Podiatry

## 2019-08-28 ENCOUNTER — Encounter: Payer: Self-pay | Admitting: Podiatry

## 2019-08-28 ENCOUNTER — Other Ambulatory Visit: Payer: Self-pay | Admitting: Internal Medicine

## 2019-08-28 DIAGNOSIS — M79674 Pain in right toe(s): Secondary | ICD-10-CM

## 2019-08-28 DIAGNOSIS — M2010 Hallux valgus (acquired), unspecified foot: Secondary | ICD-10-CM

## 2019-08-28 DIAGNOSIS — M79675 Pain in left toe(s): Secondary | ICD-10-CM | POA: Diagnosis not present

## 2019-08-28 DIAGNOSIS — I2510 Atherosclerotic heart disease of native coronary artery without angina pectoris: Secondary | ICD-10-CM | POA: Diagnosis not present

## 2019-08-28 DIAGNOSIS — N183 Chronic kidney disease, stage 3 unspecified: Secondary | ICD-10-CM

## 2019-08-28 DIAGNOSIS — J449 Chronic obstructive pulmonary disease, unspecified: Secondary | ICD-10-CM | POA: Diagnosis not present

## 2019-08-28 DIAGNOSIS — B351 Tinea unguium: Secondary | ICD-10-CM

## 2019-08-28 DIAGNOSIS — I350 Nonrheumatic aortic (valve) stenosis: Secondary | ICD-10-CM | POA: Diagnosis not present

## 2019-08-28 DIAGNOSIS — I130 Hypertensive heart and chronic kidney disease with heart failure and stage 1 through stage 4 chronic kidney disease, or unspecified chronic kidney disease: Secondary | ICD-10-CM | POA: Diagnosis not present

## 2019-08-28 DIAGNOSIS — T84018D Broken internal joint prosthesis, other site, subsequent encounter: Secondary | ICD-10-CM | POA: Diagnosis not present

## 2019-08-28 DIAGNOSIS — E1122 Type 2 diabetes mellitus with diabetic chronic kidney disease: Secondary | ICD-10-CM | POA: Diagnosis not present

## 2019-08-28 DIAGNOSIS — I509 Heart failure, unspecified: Secondary | ICD-10-CM | POA: Diagnosis not present

## 2019-08-28 DIAGNOSIS — E785 Hyperlipidemia, unspecified: Secondary | ICD-10-CM

## 2019-08-28 DIAGNOSIS — E1142 Type 2 diabetes mellitus with diabetic polyneuropathy: Secondary | ICD-10-CM | POA: Diagnosis not present

## 2019-08-28 MED ORDER — LOVASTATIN 20 MG PO TABS: 20.0000 mg | ORAL_TABLET | Freq: Every day | ORAL | 3 refills | Status: DC

## 2019-08-28 NOTE — Progress Notes (Signed)
This patient returns to my office for at risk foot care.  This patient requires this care by a professional since this patient will be at risk due to having chronic kidney disease.  This patient is unable to cut nails himself since the patient cannot reach his nails.These nails are painful walking and wearing shoes.  This patient presents for at risk foot care today.  General Appearance  Alert, conversant and in no acute stress.  Vascular  Dorsalis pedis  are palpable  bilaterally. Posterior tibial pulses are absent  B/L. Capillary return is within normal limits  bilaterally. Temperature is within normal limits  bilaterally.  Neurologic  Senn-Weinstein monofilament wire test diminished  bilaterally. Muscle power within normal limits bilaterally.  Nails Thick disfigured discolored nails with subungual debris  from hallux to fifth toes bilaterally. No evidence of bacterial infection or drainage bilaterally.  Orthopedic  No limitations of motion  feet .  No crepitus or effusions noted.  No bony pathology or digital deformities noted.  Right ankle swelling.  HAV  B/L.  Pes planus  B/L.  Skin  normotropic skin with no porokeratosis noted bilaterally.  No signs of infections or ulcers noted.     Onychomycosis  Pain in right toes  Pain in left toes  Consent was obtained for treatment procedures.   Mechanical debridement of nails 1-5  bilaterally performed with a nail nipper.  Filed with dremel without incident.    Return office visit    3 months                  Told patient to return for periodic foot care and evaluation due to potential at risk complications.   Leeona Mccardle DPM  

## 2019-08-28 NOTE — Telephone Encounter (Signed)
Patient informed and verbalized understanding.  States she is okay with this. She just bought some 40 mg pills and will cut those in half. She would like a new script of the 20 sent in for when she runs out.

## 2019-08-29 ENCOUNTER — Encounter: Payer: Self-pay | Admitting: Internal Medicine

## 2019-08-29 ENCOUNTER — Ambulatory Visit: Payer: Medicare PPO | Admitting: Internal Medicine

## 2019-08-29 VITALS — BP 122/60 | HR 70 | Temp 97.9°F | Ht 61.5 in | Wt 160.8 lb

## 2019-08-29 DIAGNOSIS — N183 Chronic kidney disease, stage 3 unspecified: Secondary | ICD-10-CM | POA: Diagnosis not present

## 2019-08-29 DIAGNOSIS — T84018D Broken internal joint prosthesis, other site, subsequent encounter: Secondary | ICD-10-CM | POA: Diagnosis not present

## 2019-08-29 DIAGNOSIS — E663 Overweight: Secondary | ICD-10-CM | POA: Insufficient documentation

## 2019-08-29 DIAGNOSIS — I509 Heart failure, unspecified: Secondary | ICD-10-CM | POA: Diagnosis not present

## 2019-08-29 DIAGNOSIS — M7071 Other bursitis of hip, right hip: Secondary | ICD-10-CM | POA: Insufficient documentation

## 2019-08-29 DIAGNOSIS — N838 Other noninflammatory disorders of ovary, fallopian tube and broad ligament: Secondary | ICD-10-CM

## 2019-08-29 DIAGNOSIS — I130 Hypertensive heart and chronic kidney disease with heart failure and stage 1 through stage 4 chronic kidney disease, or unspecified chronic kidney disease: Secondary | ICD-10-CM | POA: Diagnosis not present

## 2019-08-29 DIAGNOSIS — G8929 Other chronic pain: Secondary | ICD-10-CM

## 2019-08-29 DIAGNOSIS — N1832 Chronic kidney disease, stage 3b: Secondary | ICD-10-CM

## 2019-08-29 DIAGNOSIS — I350 Nonrheumatic aortic (valve) stenosis: Secondary | ICD-10-CM | POA: Diagnosis not present

## 2019-08-29 DIAGNOSIS — E1142 Type 2 diabetes mellitus with diabetic polyneuropathy: Secondary | ICD-10-CM | POA: Diagnosis not present

## 2019-08-29 DIAGNOSIS — E1122 Type 2 diabetes mellitus with diabetic chronic kidney disease: Secondary | ICD-10-CM | POA: Diagnosis not present

## 2019-08-29 DIAGNOSIS — J449 Chronic obstructive pulmonary disease, unspecified: Secondary | ICD-10-CM | POA: Diagnosis not present

## 2019-08-29 DIAGNOSIS — M25512 Pain in left shoulder: Secondary | ICD-10-CM | POA: Diagnosis not present

## 2019-08-29 DIAGNOSIS — I2510 Atherosclerotic heart disease of native coronary artery without angina pectoris: Secondary | ICD-10-CM | POA: Diagnosis not present

## 2019-08-29 HISTORY — DX: Other chronic pain: G89.29

## 2019-08-29 HISTORY — DX: Other bursitis of hip, right hip: M70.71

## 2019-08-29 HISTORY — DX: Overweight: E66.3

## 2019-08-29 NOTE — Progress Notes (Signed)
Chief Complaint  Patient presents with  . Follow-up   F/u  1. Wants handicap sticker filled out and given today  2. Left shoulder pain CT left shoulder 09/04/19 5/10 shoulder pain s/p surgery right and f/u Dr. Roland Rack pending surgery left has limited rom 3. Right iliopsoasis bursitis/tendonitis steroid injection 08/25/19 doing better  4. CKD 3b f/u Dr. Candiss Norse 08/21/19 Cr 1.3 GFR 40 07/21/19  5. Ovarian mass [place] 12/29/19   Review of Systems  Constitutional: Negative for weight loss.  HENT: Negative for hearing loss.   Eyes: Negative for blurred vision.  Respiratory: Negative for shortness of breath.   Cardiovascular: Negative for chest pain.  Gastrointestinal: Negative for abdominal pain.  Musculoskeletal: Negative for falls.  Skin: Negative for rash.  Neurological: Negative for headaches.  Psychiatric/Behavioral: Negative for depression.   Past Medical History:  Diagnosis Date  . Anxiety   . Asthma   . Bipolar disorder ([place])   . Cataract    s/p b/l repair   . Chicken pox   . CKD (chronic kidney disease)   . CKD (chronic kidney disease), stage III    a. s/p R nephrectomy.  . Conversion disorder   . COPD (chronic obstructive pulmonary disease) ([place])   . Depression   . Essential hypertension   . GERD (gastroesophageal reflux disease)   . Hyperlipidemia   . Inflammatory arthritis    a. hands/carpal tunnel.  b. Low titer rheumatoid factor. c. Negative anti-CCP antibodies. d. Plaquenil.  . Non-Obstructive CAD    a. 07/2009 Cath ([facility]): nonobs dzs;  b. 03/2011 Cath [facility]): nonobs dzs.  . Osteoarthritis    a. Knees.  . PUD (peptic ulcer disease)   . S/P right hip fracture    11/01/16 s/p repair  . Shoulder pain   . Sleep apnea    no cpap  . Spinal stenosis at L4-L5 level    severe with L4/L5 anterolisthesis grade 1 anterolisthesis   . Toxic maculopathy   . Valvular heart disease    a. 07/2015 Echo: EF 55-60%, Mild AI, AS, MR, and TR.   Past Surgical History:  Procedure  Laterality Date  . APPENDECTOMY    . BACK SURGERY    . BUNIONECTOMY Right   . CATARACT EXTRACTION, BILATERAL    . CESAREAN SECTION     x1  . CHOLECYSTECTOMY N/A 05/11/2016   Procedure: LAPAROSCOPIC CHOLECYSTECTOMY;  Surgeon: Florene Glen, MD;  Location: [facility] ORS;  Service: General;  Laterality: N/A;  . COLONOSCOPY WITH PROPOFOL N/A 04/02/2016   Procedure: COLONOSCOPY WITH PROPOFOL;  Surgeon: Jonathon Bellows, MD;  Location: [facility] ENDOSCOPY;  Service: Endoscopy;  Laterality: N/A;  . ENDOSCOPIC RETROGRADE CHOLANGIOPANCREATOGRAPHY (ERCP) WITH PROPOFOL N/A 05/08/2016   Procedure: ENDOSCOPIC RETROGRADE CHOLANGIOPANCREATOGRAPHY (ERCP) WITH PROPOFOL;  Surgeon: Lucilla Lame, MD;  Location: [facility] ENDOSCOPY;  Service: Endoscopy;  Laterality: N/A;  . ERCP     with biliary spincterotomy 05/08/16 Dr. Allen Norris for choledocholithiasis   . ESOPHAGEAL DILATION  04/02/2016   Procedure: ESOPHAGEAL DILATION;  Surgeon: Jonathon Bellows, MD;  Location: [facility] ENDOSCOPY;  Service: Endoscopy;;  . ESOPHAGOGASTRODUODENOSCOPY (EGD) WITH PROPOFOL N/A 04/02/2016   Procedure: ESOPHAGOGASTRODUODENOSCOPY (EGD) WITH PROPOFOL;  Surgeon: Jonathon Bellows, MD;  Location: [facility] ENDOSCOPY;  Service: Endoscopy;  Laterality: N/A;  . HIP ARTHROPLASTY Right 11/01/2016   Procedure: ARTHROPLASTY BIPOLAR HIP (HEMIARTHROPLASTY);  Surgeon: Corky Mull, MD;  Location: [facility] ORS;  Service: Orthopedics;  Laterality: Right;  . 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 osteoporosis     noted DEXA 08/19/16   . REPLACEMENT TOTAL KNEE Right   . REVERSE SHOULDER ARTHROPLASTY Right 11/04/2017   Procedure: REVERSE SHOULDER ARTHROPLASTY;  Surgeon: Corky Mull, MD;  Location: [facility] ORS;  Service: Orthopedics;  Laterality: Right;  . REVERSE SHOULDER ARTHROPLASTY Left 07/26/2018   Procedure: REVERSE SHOULDER ARTHROPLASTY;  Surgeon: Corky Mull, MD;  Location: [facility] OR[facility];  Service: Orthopedics;  Laterality: Left;  . TONSILLECTOMY    .  TOTAL HIP ARTHROPLASTY  12/10/11   [facility] left hip  . TOTAL HIP ARTHROPLASTY Bilateral   . TUBAL LIGATION     Family History  Problem Relation Age of Onset  . Rheum arthritis Mother   . Asthma Mother   . Parkinson's disease Mother   . Heart disease Mother   . Stroke Mother   . Hypertension Mother   . Heart attack Father   . Heart disease Father   . Hypertension Father   . Peripheral Artery Disease Father   . Diabetes Son   . Gout Son   . Asthma Sister   . Heart disease Sister   . Lung cancer Sister   . Heart disease Sister   . Heart disease Sister   . Breast cancer Sister   . Heart attack Sister   . Heart disease Brother   . Heart disease Maternal Grandmother   . Diabetes Maternal Grandmother   . Colon cancer Maternal Grandmother   . Cancer Maternal Grandmother        Hodgkins lymphoma  . Heart disease Brother   . Alcohol abuse Brother   . Depression Brother   . Dementia Son    Social History   Socioeconomic History  . Marital status: Widowed    Spouse name: richard  . Number of children: 2  . Years of education: Some Coll  . Highest education level: Some college, no degree  Occupational History  . Occupation: Retired    Comment: retired  Tobacco Use  . Smoking status: Former Smoker    Packs/day: 0.50    Years: 20.00    Pack years: 10.00    Types: Cigarettes    Quit date: 02/02/1974    Years since quitting: 45.6  . Smokeless tobacco: Never Used  Vaping Use  . Vaping Use: Never used  Substance and Sexual Activity  . Alcohol use: No  . Drug use: No  . Sexual activity: Not Currently  Other Topics Concern  . Not on file  Social History Narrative   Lives at home in [place] grandson, his wife and their child.   Husband Carlinda Ohlson died covid 82 late 1/2068married x 81 years   Right-handed.   6 cups coffee per day.   Social Determinants of Health   Financial Resource Strain: Low Risk   . Difficulty of Paying Living Expenses: Not hard at all  Food  Insecurity: No Food Insecurity  . Worried About Charity fundraiser in the Last Year: Never true  . Ran Out of Food in the Last Year: Never true  Transportation Needs:   . Lack of Transportation (Medical):   Marland Kitchen Lack of Transportation (Non-Medical):   Physical Activity:   . Days of Exercise per Week:   . Minutes of Exercise per Session:   Stress:   . Feeling of Stress :   Social Connections:   . Frequency of Communication with Friends and Family:   . Frequency of Social Gatherings with Friends and Family:   .  Attends Religious Services:   . Active Member of Clubs or Organizations:   . Attends Archivist Meetings:   Marland Kitchen Marital Status:   Intimate Partner Violence:   . Fear of Current or Ex-Partner:   . Emotionally Abused:   Marland Kitchen Physically Abused:   . Sexually Abused:    Current Meds  Medication Sig  . Adalimumab (HUMIRA PEN) 40 MG/0.4ML PNKT Inject 40 mg into the skin every 14 (fourteen) days.   Marland Kitchen albuterol (VENTOLIN HFA) 108 (90 Base) MCG/ACT inhaler Inhale 2 puffs into the lungs every 6 (six) hours as needed.  . ALPRAZolam (XANAX) 0.25 MG tablet Take 1 tablet (0.25 mg total) by mouth at bedtime as needed for anxiety.  Marland Kitchen amLODipine (NORVASC) 2.5 MG tablet Take 2.5 mg by mouth daily.  . benzonatate (TESSALON) 200 MG capsule Take by mouth.  . budesonide (PULMICORT) 0.25 MG/2ML nebulizer solution Take 2 mLs (0.25 mg total) by nebulization 2 (two) times daily.  Marland Kitchen BYSTOLIC 10 MG tablet TAKE 1 TABLET BY MOUTH EVERY DAY  . Cholecalciferol 50 MCG (2000 UT) CAPS Take by mouth.  . dicyclomine (BENTYL) 10 MG capsule TAKE 1 CAPSULE (10 MG TOTAL) BY MOUTH 4 (FOUR) TIMES DAILY - BEFORE MEALS AND AT BEDTIME.  Marland Kitchen escitalopram (LEXAPRO) 10 MG tablet Take 1 tablet (10 mg total) by mouth daily.  . famotidine (PEPCID) 20 MG tablet Take 1 tablet (20 mg total) by mouth daily. Before breakfast or dinner  . fluticasone (FLONASE) 50 MCG/ACT nasal spray Place 1 spray into both nostrils 2 (two) times  daily.  Marland Kitchen gabapentin (NEURONTIN) 300 MG capsule Take 2-3 capsules (600-900 mg total) by mouth See admin instructions. Take 600 mg by mouth in the morning and 900 mg at bedtime  . ipratropium-albuterol (DUONEB) 0.5-2.5 (3) MG/3ML SOLN Take 3 mLs by nebulization 3 (three) times daily as needed.  . lamoTRIgine (LAMICTAL) 100 MG tablet Take 1 tablet (100 mg total) by mouth 2 (two) times daily.  Marland Kitchen leflunomide (ARAVA) 20 MG tablet Take 1 tablet (20 mg total) by mouth daily.  Marland Kitchen lovastatin (MEVACOR) 20 MG tablet Take 1 tablet (20 mg total) by mouth at bedtime.  . montelukast (SINGULAIR) 10 MG tablet Take 1 tablet (10 mg total) by mouth daily.  . multivitamin-lutein (OCUVITE-LUTEIN) CAPS capsule Take 1 capsule by mouth 2 (two) times daily.  Marland Kitchen MYRBETRIQ 50 MG TB24 tablet TAKE 1 TABLET BY MOUTH EVERYDAY AT BEDTIME  . pantoprazole (PROTONIX) 40 MG tablet Take 1 tablet (40 mg total) by mouth 2 (two) times daily. 30 minutes before food. Note reduction in frequency  . QUEtiapine (SEROQUEL) 25 MG tablet Take 1 tablet (25 mg total) by mouth at bedtime.  . sucralfate (CARAFATE) 1 g tablet Take 1 tablet (1 g total) by mouth 4 (four) times daily. TAKE 1 TABLET BY MOUTH 4 TIMES A DAY WITH MEALS AND AT BEDTIME   Allergies  Allergen Reactions  . Ceftin [Cefuroxime Axetil] Anaphylaxis  . Lisinopril Anaphylaxis  . Morphine Other (See Comments)    Per patient, low blood pressure issues that requires action to raise it back up. Can take small infrequent doses  . Sulfasalazine Anaphylaxis  . Aspirin Other (See Comments)    Sulfasalazine allergy cross reacts  . Antihistamines, Chlorpheniramine-Type Other (See Comments)    Makes pt hyper  . Antivert [Meclizine Hcl] Other (See Comments)    Bladder will not empty  . Decongestant [Pseudoephedrine Hcl] Other (See Comments)    Makes pt hyper  .  Doxycycline Other (See Comments)    GI upset  . Polymyxin B Other (See Comments)    Medication was in eye drops.  . Sulfa  Antibiotics Other (See Comments)    Face swelling  . Xarelto [Rivaroxaban] Other (See Comments)    Stomach burning, bleeding, and tar in stool  . Adhesive [Tape] Rash  . Iodine Hives and Rash    Per patient allergy is to contrast dye only, she is able to use betadine scrubs.  [place] [Levofloxacin In D5w] Rash  . Tetanus Toxoids Rash and Other (See Comments)    Fever and hot to touch at injection site   No results found for this or any previous visit (from the past 2160 hour(s)). Objective  Body mass index is 29.89 kg/m. Wt Readings from Last 3 Encounters:  08/29/19 160 lb 12.8 oz (72.9 kg)  06/16/19 165 lb 9.6 oz (75.1 kg)  06/05/19 166 lb (75.3 kg)   Temp Readings from Last 3 Encounters:  08/29/19 97.9 F (36.6 C) (Oral)  06/16/19 97.9 F (36.6 C) (Temporal)  05/08/19 98.7 F (37.1 C)   BP Readings from Last 3 Encounters:  08/29/19 (!) 122/60  06/16/19 100/60  06/05/19 (!) 172/67   Pulse Readings from Last 3 Encounters:  08/29/19 70  06/16/19 67  06/05/19 71    Physical Exam Vitals and nursing note reviewed.  Constitutional:      Appearance: Normal appearance. She is well-developed, well-groomed and overweight.  HENT:     Head: Normocephalic and atraumatic.  Eyes:     Conjunctiva/sclera: Conjunctivae normal.     Pupils: Pupils are equal, round, and reactive to light.  Cardiovascular:     Rate and Rhythm: Normal rate and regular rhythm.     Heart sounds: Normal heart sounds. No murmur heard.   Pulmonary:     Effort: Pulmonary effort is normal.     Breath sounds: Normal breath sounds.  Abdominal:     General: Abdomen is flat. Bowel sounds are normal.     Tenderness: There is no abdominal tenderness.  Skin:    General: Skin is warm and dry.  Neurological:     General: No focal deficit present.     Mental Status: She is alert and oriented to person, place, and time. Mental status is at baseline.     Gait: Gait normal.  Psychiatric:        Attention  and Perception: Attention and perception normal.        Mood and Affect: Mood and affect normal.        Speech: Speech normal.        Behavior: Behavior normal. Behavior is cooperative.        Thought Content: Thought content normal.        Cognition and Memory: Cognition and memory normal.        Judgment: Judgment normal.     Assessment  Plan  Chronic left shoulder pain Iliopsoas bursitis of right hip CT shoulder 09/04/19  F/u [facility] Dr. Roland Rack tbd surg left shoulder  Stage 3b chronic kidney disease Seen Dr. Aleene Davidson 08/21/19  Ovarian mass [place] 12/2019 Dr. Leafy Ro   Overweight (BMI 25.0-29.9)  rec healthy diet and exercise   [facility] Labs had 04/24/19 and 07/21/19 Cr 1.3 GFR 40 stable CKD 3 lfts normal  WBC 8.0 H/H 12.4/39.3  plts 175   Fasting labs lipid, A1C, TSH, UA  12/01/19  Had flu8/31/20 pulm clinic, prevnar, pna 23utd allergic to Tdap declines  shingrix covid 19 vaccine has not had due to allergies  mammo3/24/2020 repeat normalreferred againwill need to be schedule  Colonoscopy 04/02/16 multiple polyps but tortuous colon GI does not want to do any further   Out of age window pap   DEXA 08/19/16 +osteoporosis on Forteo will need to repeat 1 year after Forteo use to see improvement rheumatology following [place]. -DEXA 10/12/17 with T score -4.1 worse on forteounable to do any further bone densitiesper pt 07/07/2018 due to h/o multiple ortho surgeries +Osteoporosis  Off forteo was on x 2 years [place] Dr. [place]efm [place] reclast upcoming 2021   [place] [facility] to get closer to home and due for f/u 05/2019 will encourage pt to call  To schedule appt  Dr. Kellie Moor 11/2019  Dr. Rosine Door Retinal specialist 08/2017, wasscheduled 12/2018, 04/24/19, 05/22/19 injection 09/2019 Q 8 weeks   Consider h/o in future with persistent leukocytosis  -refer at f/ulast CBC WBC normal 07/2019 care everywhere  Echo 11/22/18  [facility]  INTERPRETATION   NORMAL LEFT VENTRICULAR SYSTOLIC FUNCTION WITH MILD LVH   NORMAL RIGHT VENTRICULAR SYSTOLIC FUNCTION   VALVULAR REGURGITATION: MODERATE AR, MODERATE MR, TRIVIAL TR   NO VALVULAR STENOSIS     Provider: Dr. Olivia Mackie Mc[facility]-Scocuzza-Internal Medicine

## 2019-08-29 NOTE — Patient Instructions (Signed)
Debrox ear wax drops 5-10 drops let ear sit up 5-10 minutes x 1 week 1x per month   Earwax Buildup, Adult The ears produce a substance called earwax that helps keep bacteria out of the ear and protects the skin in the ear canal. Occasionally, earwax can build up in the ear and cause discomfort or hearing loss. What increases the risk? This condition is more likely to develop in people who:  Are female.  Are elderly.  Naturally produce more earwax.  Clean their ears often with cotton swabs.  Use earplugs often.  Use in-ear headphones often.  Wear hearing aids.  Have narrow ear canals.  Have earwax that is overly thick or sticky.  Have eczema.  Are dehydrated.  Have excess hair in the ear canal. What are the signs or symptoms? Symptoms of this condition include:  Reduced or muffled hearing.  A feeling of fullness in the ear or feeling that the ear is plugged.  Fluid coming from the ear.  Ear pain.  Ear itch.  Ringing in the ear.  Coughing.  An obvious piece of earwax that can be seen inside the ear canal. How is this diagnosed? This condition may be diagnosed based on:  Your symptoms.  Your medical history.  An ear exam. During the exam, your health care provider will look into your ear with an instrument called an otoscope. You may have tests, including a hearing test. How is this treated? This condition may be treated by:  Using ear drops to soften the earwax.  Having the earwax removed by a health care provider. The health care provider may: ? Flush the ear with water. ? Use an instrument that has a loop on the end (curette). ? Use a suction device.  Surgery to remove the wax buildup. This may be done in severe cases. Follow these instructions at home:   Take over-the-counter and prescription medicines only as told by your health care provider.  Do not put any objects, including cotton swabs, into your ear. You can clean the opening of your ear  canal with a washcloth or facial tissue.  Follow instructions from your health care provider about cleaning your ears. Do not over-clean your ears.  Drink enough fluid to keep your urine clear or pale yellow. This will help to thin the earwax.  Keep all follow-up visits as told by your health care provider. If earwax builds up in your ears often or if you use hearing aids, consider seeing your health care provider for routine, preventive ear cleanings. Ask your health care provider how often you should schedule your cleanings.  If you have hearing aids, clean them according to instructions from the manufacturer and your health care provider. Contact a health care provider if:  You have ear pain.  You develop a fever.  You have blood, pus, or other fluid coming from your ear.  You have hearing loss.  You have ringing in your ears that does not go away.  Your symptoms do not improve with treatment.  You feel like the room is spinning (vertigo). Summary  Earwax can build up in the ear and cause discomfort or hearing loss.  The most common symptoms of this condition include reduced or muffled hearing and a feeling of fullness in the ear or feeling that the ear is plugged.  This condition may be diagnosed based on your symptoms, your medical history, and an ear exam.  This condition may be treated by using ear drops  to soften the earwax or by having the earwax removed by a health care provider.  Do not put any objects, including cotton swabs, into your ear. You can clean the opening of your ear canal with a washcloth or facial tissue. This information is not intended to replace advice given to you by your health care provider. Make sure you discuss any questions you have with your health care provider. Document Revised: 01/01/2017 Document Reviewed: 04/01/2016 Elsevier Patient Education  2020 [employer].

## 2019-08-30 DIAGNOSIS — I350 Nonrheumatic aortic (valve) stenosis: Secondary | ICD-10-CM | POA: Diagnosis not present

## 2019-08-30 DIAGNOSIS — I509 Heart failure, unspecified: Secondary | ICD-10-CM | POA: Diagnosis not present

## 2019-08-30 DIAGNOSIS — T84018D Broken internal joint prosthesis, other site, subsequent encounter: Secondary | ICD-10-CM | POA: Diagnosis not present

## 2019-08-30 DIAGNOSIS — I130 Hypertensive heart and chronic kidney disease with heart failure and stage 1 through stage 4 chronic kidney disease, or unspecified chronic kidney disease: Secondary | ICD-10-CM | POA: Diagnosis not present

## 2019-08-30 DIAGNOSIS — E1122 Type 2 diabetes mellitus with diabetic chronic kidney disease: Secondary | ICD-10-CM | POA: Diagnosis not present

## 2019-08-30 DIAGNOSIS — I2510 Atherosclerotic heart disease of native coronary artery without angina pectoris: Secondary | ICD-10-CM | POA: Diagnosis not present

## 2019-08-30 DIAGNOSIS — J449 Chronic obstructive pulmonary disease, unspecified: Secondary | ICD-10-CM | POA: Diagnosis not present

## 2019-08-30 DIAGNOSIS — E1142 Type 2 diabetes mellitus with diabetic polyneuropathy: Secondary | ICD-10-CM | POA: Diagnosis not present

## 2019-08-30 DIAGNOSIS — N183 Chronic kidney disease, stage 3 unspecified: Secondary | ICD-10-CM | POA: Diagnosis not present

## 2019-08-30 NOTE — Telephone Encounter (Signed)
Faxed drug therapy alert to [facility] for reducing dose of lovastatin on 08-30-19

## 2019-08-31 DIAGNOSIS — I509 Heart failure, unspecified: Secondary | ICD-10-CM | POA: Diagnosis not present

## 2019-08-31 DIAGNOSIS — J449 Chronic obstructive pulmonary disease, unspecified: Secondary | ICD-10-CM | POA: Diagnosis not present

## 2019-08-31 DIAGNOSIS — T84018D Broken internal joint prosthesis, other site, subsequent encounter: Secondary | ICD-10-CM | POA: Diagnosis not present

## 2019-08-31 DIAGNOSIS — I2510 Atherosclerotic heart disease of native coronary artery without angina pectoris: Secondary | ICD-10-CM | POA: Diagnosis not present

## 2019-08-31 DIAGNOSIS — I130 Hypertensive heart and chronic kidney disease with heart failure and stage 1 through stage 4 chronic kidney disease, or unspecified chronic kidney disease: Secondary | ICD-10-CM | POA: Diagnosis not present

## 2019-08-31 DIAGNOSIS — E1142 Type 2 diabetes mellitus with diabetic polyneuropathy: Secondary | ICD-10-CM | POA: Diagnosis not present

## 2019-08-31 DIAGNOSIS — N183 Chronic kidney disease, stage 3 unspecified: Secondary | ICD-10-CM | POA: Diagnosis not present

## 2019-08-31 DIAGNOSIS — E1122 Type 2 diabetes mellitus with diabetic chronic kidney disease: Secondary | ICD-10-CM | POA: Diagnosis not present

## 2019-08-31 DIAGNOSIS — I350 Nonrheumatic aortic (valve) stenosis: Secondary | ICD-10-CM | POA: Diagnosis not present

## 2019-09-04 ENCOUNTER — Other Ambulatory Visit: Payer: Self-pay

## 2019-09-04 ENCOUNTER — Ambulatory Visit
Admission: RE | Admit: 2019-09-04 | Discharge: 2019-09-04 | Disposition: A | Discharge status: Home / Self Care | Payer: Medicare PPO | Source: Ambulatory Visit | Attending: Surgery | Admitting: Surgery

## 2019-09-04 DIAGNOSIS — S42125K Nondisplaced fracture of acromial process, left shoulder, subsequent encounter for fracture with nonunion: Secondary | ICD-10-CM | POA: Insufficient documentation

## 2019-09-04 DIAGNOSIS — M25512 Pain in left shoulder: Secondary | ICD-10-CM | POA: Diagnosis not present

## 2019-09-06 DIAGNOSIS — N183 Chronic kidney disease, stage 3 unspecified: Secondary | ICD-10-CM | POA: Diagnosis not present

## 2019-09-06 DIAGNOSIS — I509 Heart failure, unspecified: Secondary | ICD-10-CM | POA: Diagnosis not present

## 2019-09-06 DIAGNOSIS — I130 Hypertensive heart and chronic kidney disease with heart failure and stage 1 through stage 4 chronic kidney disease, or unspecified chronic kidney disease: Secondary | ICD-10-CM | POA: Diagnosis not present

## 2019-09-06 DIAGNOSIS — E1122 Type 2 diabetes mellitus with diabetic chronic kidney disease: Secondary | ICD-10-CM | POA: Diagnosis not present

## 2019-09-06 DIAGNOSIS — I350 Nonrheumatic aortic (valve) stenosis: Secondary | ICD-10-CM | POA: Diagnosis not present

## 2019-09-06 DIAGNOSIS — J449 Chronic obstructive pulmonary disease, unspecified: Secondary | ICD-10-CM | POA: Diagnosis not present

## 2019-09-06 DIAGNOSIS — E1142 Type 2 diabetes mellitus with diabetic polyneuropathy: Secondary | ICD-10-CM | POA: Diagnosis not present

## 2019-09-06 DIAGNOSIS — I2510 Atherosclerotic heart disease of native coronary artery without angina pectoris: Secondary | ICD-10-CM | POA: Diagnosis not present

## 2019-09-06 DIAGNOSIS — T84018D Broken internal joint prosthesis, other site, subsequent encounter: Secondary | ICD-10-CM | POA: Diagnosis not present

## 2019-09-06 IMAGING — DX CHEST - 2 VIEW
2 series · 2 of 2 positions shown · non-contrast
Comparison: Chest x-ray dated 07/14/2018

CLINICAL DATA: Chronic cough.

EXAM:
CHEST - 2 VIEW

[chest pa]
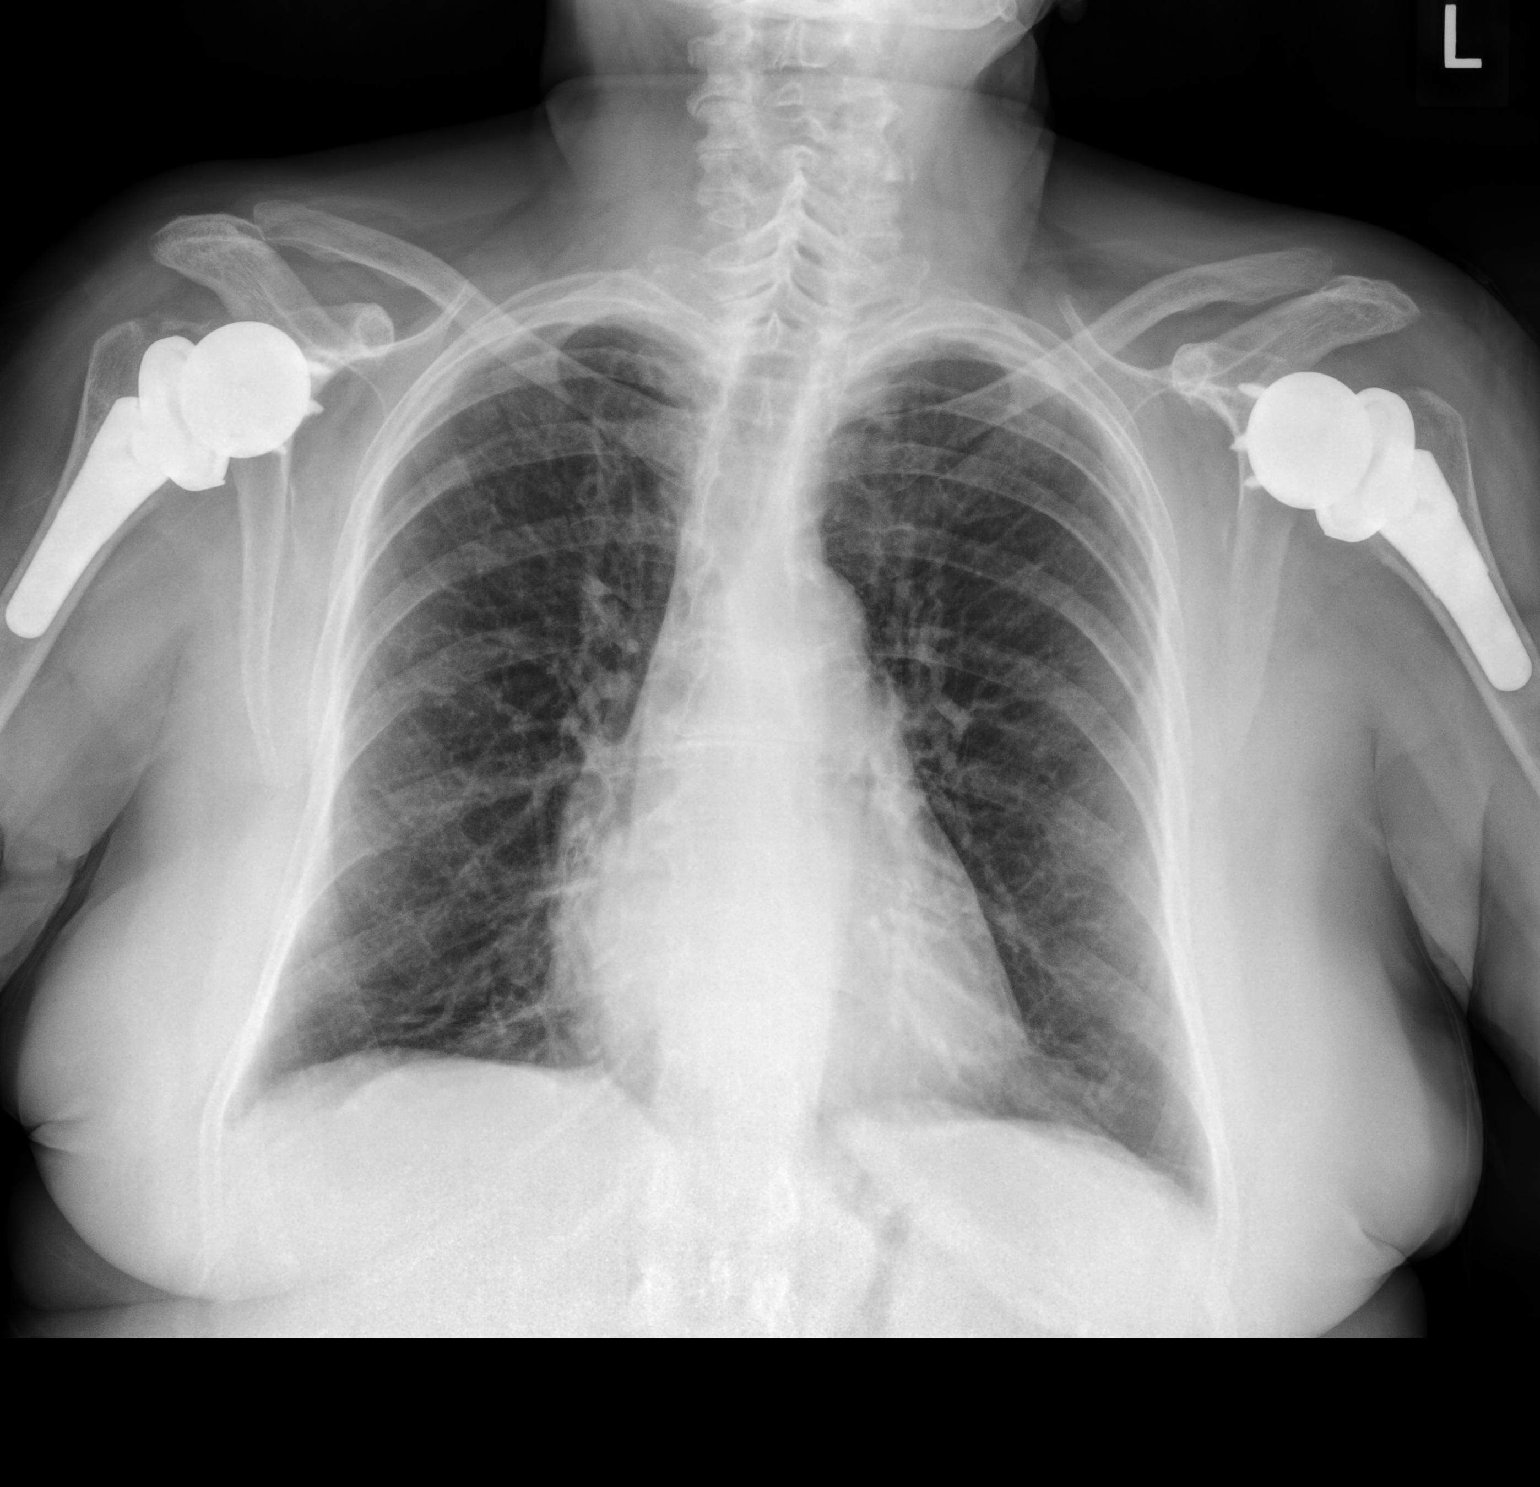

[chest lat]
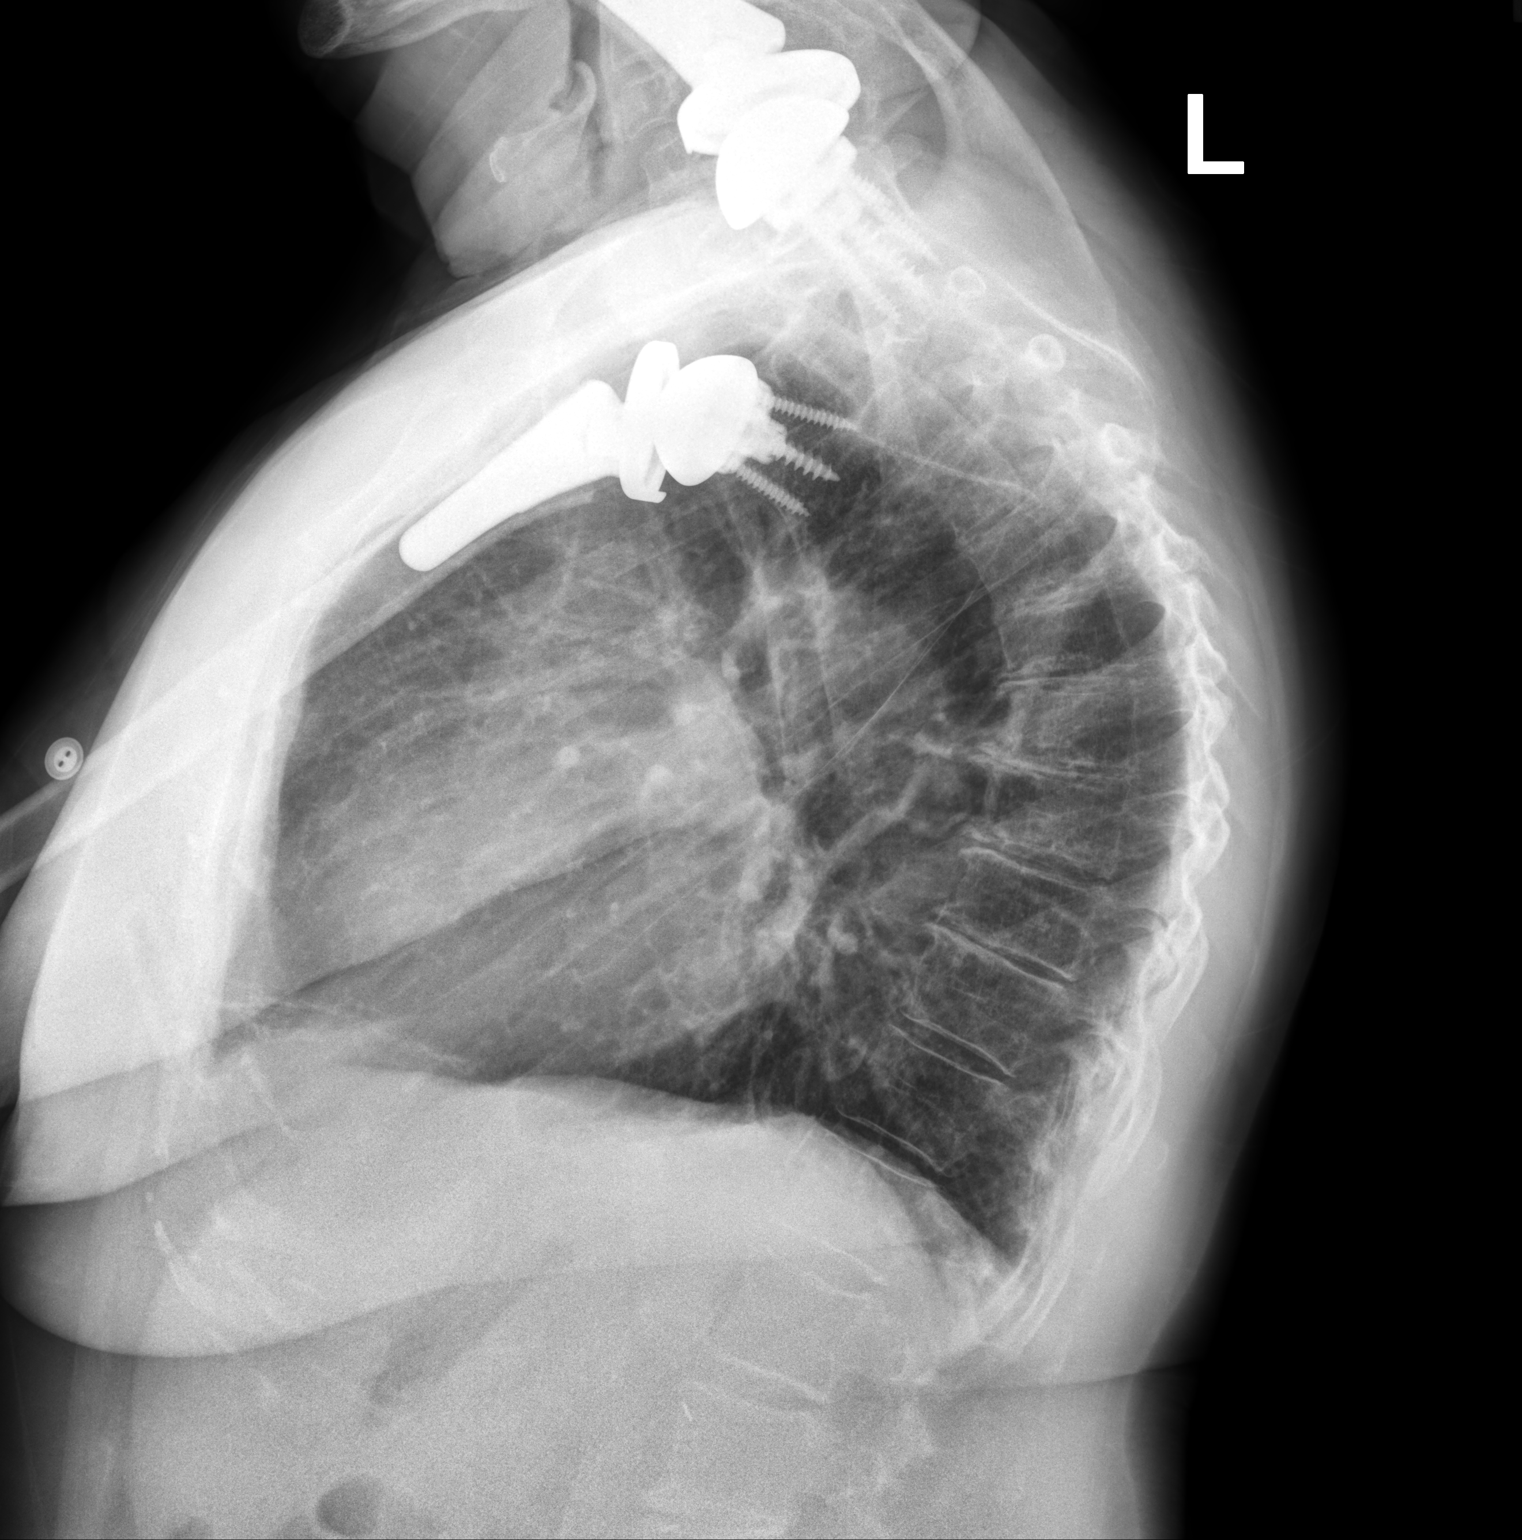

[2 of 2 positions shown; findings below may reference images not displayed]

FINDINGS: The heart size and mediastinal contours are within normal limits.
Both lungs are clear. Chronic accentuation of the thoracic kyphosis.
Bilateral total shoulder replacements.
IMPRESSION: No active cardiopulmonary disease.

## 2019-09-07 DIAGNOSIS — I130 Hypertensive heart and chronic kidney disease with heart failure and stage 1 through stage 4 chronic kidney disease, or unspecified chronic kidney disease: Secondary | ICD-10-CM | POA: Diagnosis not present

## 2019-09-07 DIAGNOSIS — J449 Chronic obstructive pulmonary disease, unspecified: Secondary | ICD-10-CM | POA: Diagnosis not present

## 2019-09-07 DIAGNOSIS — E1122 Type 2 diabetes mellitus with diabetic chronic kidney disease: Secondary | ICD-10-CM | POA: Diagnosis not present

## 2019-09-07 DIAGNOSIS — I509 Heart failure, unspecified: Secondary | ICD-10-CM | POA: Diagnosis not present

## 2019-09-07 DIAGNOSIS — T84018D Broken internal joint prosthesis, other site, subsequent encounter: Secondary | ICD-10-CM | POA: Diagnosis not present

## 2019-09-07 DIAGNOSIS — E1142 Type 2 diabetes mellitus with diabetic polyneuropathy: Secondary | ICD-10-CM | POA: Diagnosis not present

## 2019-09-07 DIAGNOSIS — I2510 Atherosclerotic heart disease of native coronary artery without angina pectoris: Secondary | ICD-10-CM | POA: Diagnosis not present

## 2019-09-07 DIAGNOSIS — I350 Nonrheumatic aortic (valve) stenosis: Secondary | ICD-10-CM | POA: Diagnosis not present

## 2019-09-07 DIAGNOSIS — N183 Chronic kidney disease, stage 3 unspecified: Secondary | ICD-10-CM | POA: Diagnosis not present

## 2019-09-13 DIAGNOSIS — E1142 Type 2 diabetes mellitus with diabetic polyneuropathy: Secondary | ICD-10-CM | POA: Diagnosis not present

## 2019-09-13 DIAGNOSIS — I2510 Atherosclerotic heart disease of native coronary artery without angina pectoris: Secondary | ICD-10-CM | POA: Diagnosis not present

## 2019-09-13 DIAGNOSIS — T84018D Broken internal joint prosthesis, other site, subsequent encounter: Secondary | ICD-10-CM | POA: Diagnosis not present

## 2019-09-13 DIAGNOSIS — N183 Chronic kidney disease, stage 3 unspecified: Secondary | ICD-10-CM | POA: Diagnosis not present

## 2019-09-13 DIAGNOSIS — I509 Heart failure, unspecified: Secondary | ICD-10-CM | POA: Diagnosis not present

## 2019-09-13 DIAGNOSIS — I130 Hypertensive heart and chronic kidney disease with heart failure and stage 1 through stage 4 chronic kidney disease, or unspecified chronic kidney disease: Secondary | ICD-10-CM | POA: Diagnosis not present

## 2019-09-13 DIAGNOSIS — I350 Nonrheumatic aortic (valve) stenosis: Secondary | ICD-10-CM | POA: Diagnosis not present

## 2019-09-13 DIAGNOSIS — E1122 Type 2 diabetes mellitus with diabetic chronic kidney disease: Secondary | ICD-10-CM | POA: Diagnosis not present

## 2019-09-13 DIAGNOSIS — J449 Chronic obstructive pulmonary disease, unspecified: Secondary | ICD-10-CM | POA: Diagnosis not present

## 2019-09-14 DIAGNOSIS — I509 Heart failure, unspecified: Secondary | ICD-10-CM | POA: Diagnosis not present

## 2019-09-14 DIAGNOSIS — T84018D Broken internal joint prosthesis, other site, subsequent encounter: Secondary | ICD-10-CM | POA: Diagnosis not present

## 2019-09-14 DIAGNOSIS — I350 Nonrheumatic aortic (valve) stenosis: Secondary | ICD-10-CM | POA: Diagnosis not present

## 2019-09-14 DIAGNOSIS — I2510 Atherosclerotic heart disease of native coronary artery without angina pectoris: Secondary | ICD-10-CM | POA: Diagnosis not present

## 2019-09-14 DIAGNOSIS — E1122 Type 2 diabetes mellitus with diabetic chronic kidney disease: Secondary | ICD-10-CM | POA: Diagnosis not present

## 2019-09-14 DIAGNOSIS — I130 Hypertensive heart and chronic kidney disease with heart failure and stage 1 through stage 4 chronic kidney disease, or unspecified chronic kidney disease: Secondary | ICD-10-CM | POA: Diagnosis not present

## 2019-09-14 DIAGNOSIS — E1142 Type 2 diabetes mellitus with diabetic polyneuropathy: Secondary | ICD-10-CM | POA: Diagnosis not present

## 2019-09-14 DIAGNOSIS — N183 Chronic kidney disease, stage 3 unspecified: Secondary | ICD-10-CM | POA: Diagnosis not present

## 2019-09-14 DIAGNOSIS — J449 Chronic obstructive pulmonary disease, unspecified: Secondary | ICD-10-CM | POA: Diagnosis not present

## 2019-09-18 DIAGNOSIS — H353211 Exudative age-related macular degeneration, right eye, with active choroidal neovascularization: Secondary | ICD-10-CM | POA: Diagnosis not present

## 2019-09-18 DIAGNOSIS — H353122 Nonexudative age-related macular degeneration, left eye, intermediate dry stage: Secondary | ICD-10-CM | POA: Diagnosis not present

## 2019-09-20 DIAGNOSIS — I350 Nonrheumatic aortic (valve) stenosis: Secondary | ICD-10-CM | POA: Diagnosis not present

## 2019-09-20 DIAGNOSIS — I130 Hypertensive heart and chronic kidney disease with heart failure and stage 1 through stage 4 chronic kidney disease, or unspecified chronic kidney disease: Secondary | ICD-10-CM | POA: Diagnosis not present

## 2019-09-20 DIAGNOSIS — I2510 Atherosclerotic heart disease of native coronary artery without angina pectoris: Secondary | ICD-10-CM | POA: Diagnosis not present

## 2019-09-20 DIAGNOSIS — J449 Chronic obstructive pulmonary disease, unspecified: Secondary | ICD-10-CM | POA: Diagnosis not present

## 2019-09-20 DIAGNOSIS — E1142 Type 2 diabetes mellitus with diabetic polyneuropathy: Secondary | ICD-10-CM | POA: Diagnosis not present

## 2019-09-20 DIAGNOSIS — I509 Heart failure, unspecified: Secondary | ICD-10-CM | POA: Diagnosis not present

## 2019-09-20 DIAGNOSIS — N183 Chronic kidney disease, stage 3 unspecified: Secondary | ICD-10-CM | POA: Diagnosis not present

## 2019-09-20 DIAGNOSIS — T84018D Broken internal joint prosthesis, other site, subsequent encounter: Secondary | ICD-10-CM | POA: Diagnosis not present

## 2019-09-20 DIAGNOSIS — E1122 Type 2 diabetes mellitus with diabetic chronic kidney disease: Secondary | ICD-10-CM | POA: Diagnosis not present

## 2019-09-22 ENCOUNTER — Telehealth: Payer: Self-pay | Admitting: Pulmonary Disease

## 2019-09-22 DIAGNOSIS — Z96612 Presence of left artificial shoulder joint: Secondary | ICD-10-CM | POA: Diagnosis not present

## 2019-09-22 DIAGNOSIS — S42122K Displaced fracture of acromial process, left shoulder, subsequent encounter for fracture with nonunion: Secondary | ICD-10-CM | POA: Diagnosis not present

## 2019-09-22 MED ORDER — BENZONATATE 200 MG PO CAPS: 200.0000 mg | ORAL_CAPSULE | Freq: Three times a day (TID) | ORAL | 0 refills | Status: DC | PRN

## 2019-09-22 NOTE — Telephone Encounter (Signed)
ATC, left VM.  Advised that we would have to contact Dr. Patsey Berthold to refill the Benzonatate [facility]).  Dr. Patsey Berthold, Please advise whether it is ok to refill the Benzonatate tabs.  Thank you.

## 2019-09-22 NOTE — Telephone Encounter (Signed)
Ok to refill [employer]

## 2019-09-22 NOTE — Telephone Encounter (Signed)
Rx for Tessalon 200mg  has been sent to preferred pharmacy.  Left message to make patient aware.

## 2019-09-25 IMAGING — DX RIGHT SHOULDER - 2+ VIEW
3 series · 3 of 3 positions shown · non-contrast
Comparison: 11/04/2017

CLINICAL DATA: Shoulder pain

EXAM:
RIGHT SHOULDER - 2+ VIEW

[shoulder axial]
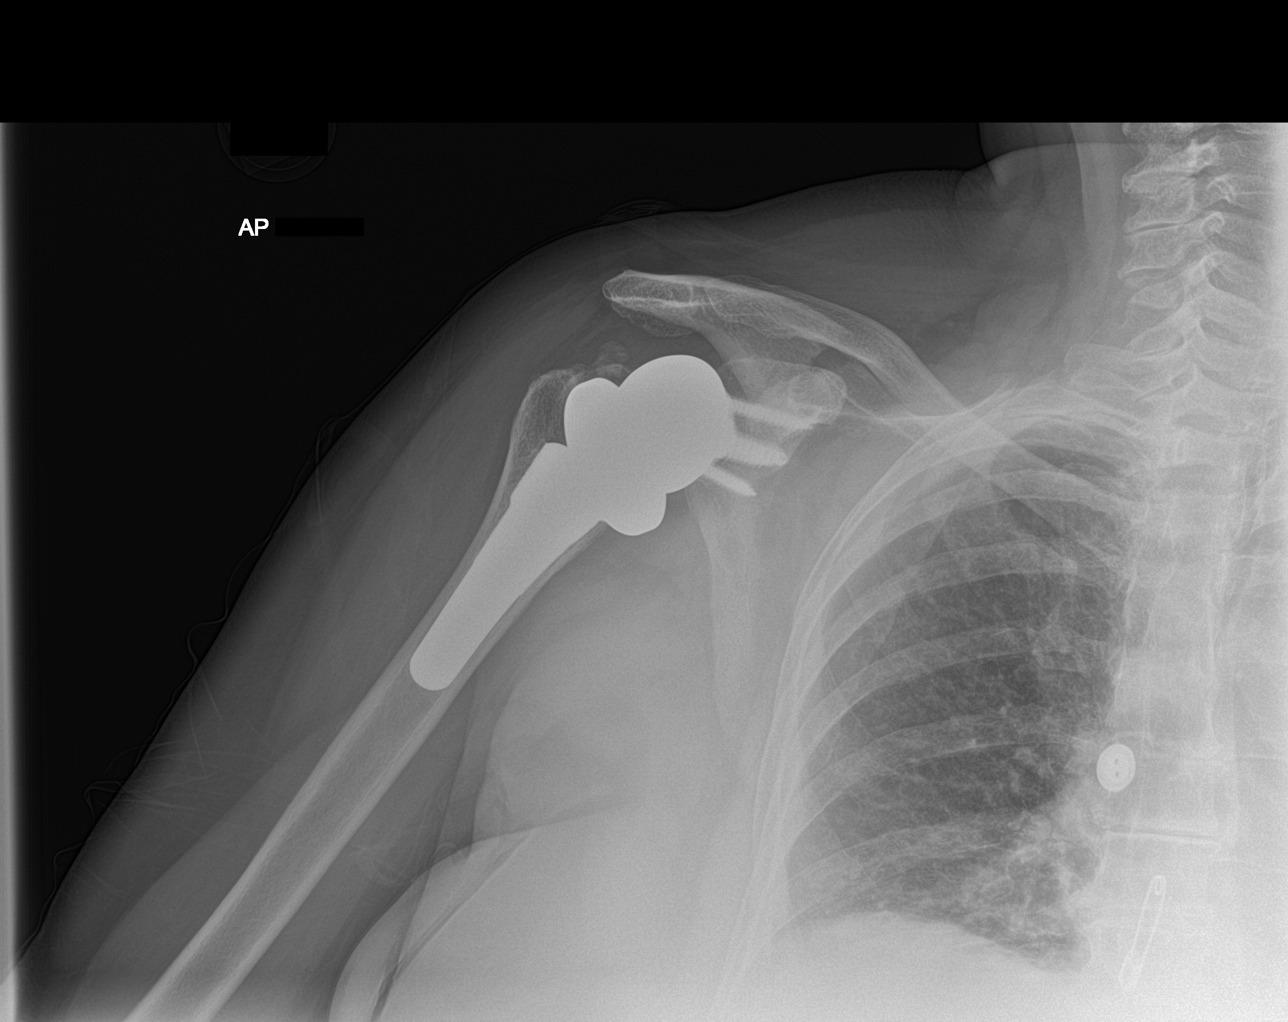

[shoulder swimmer]
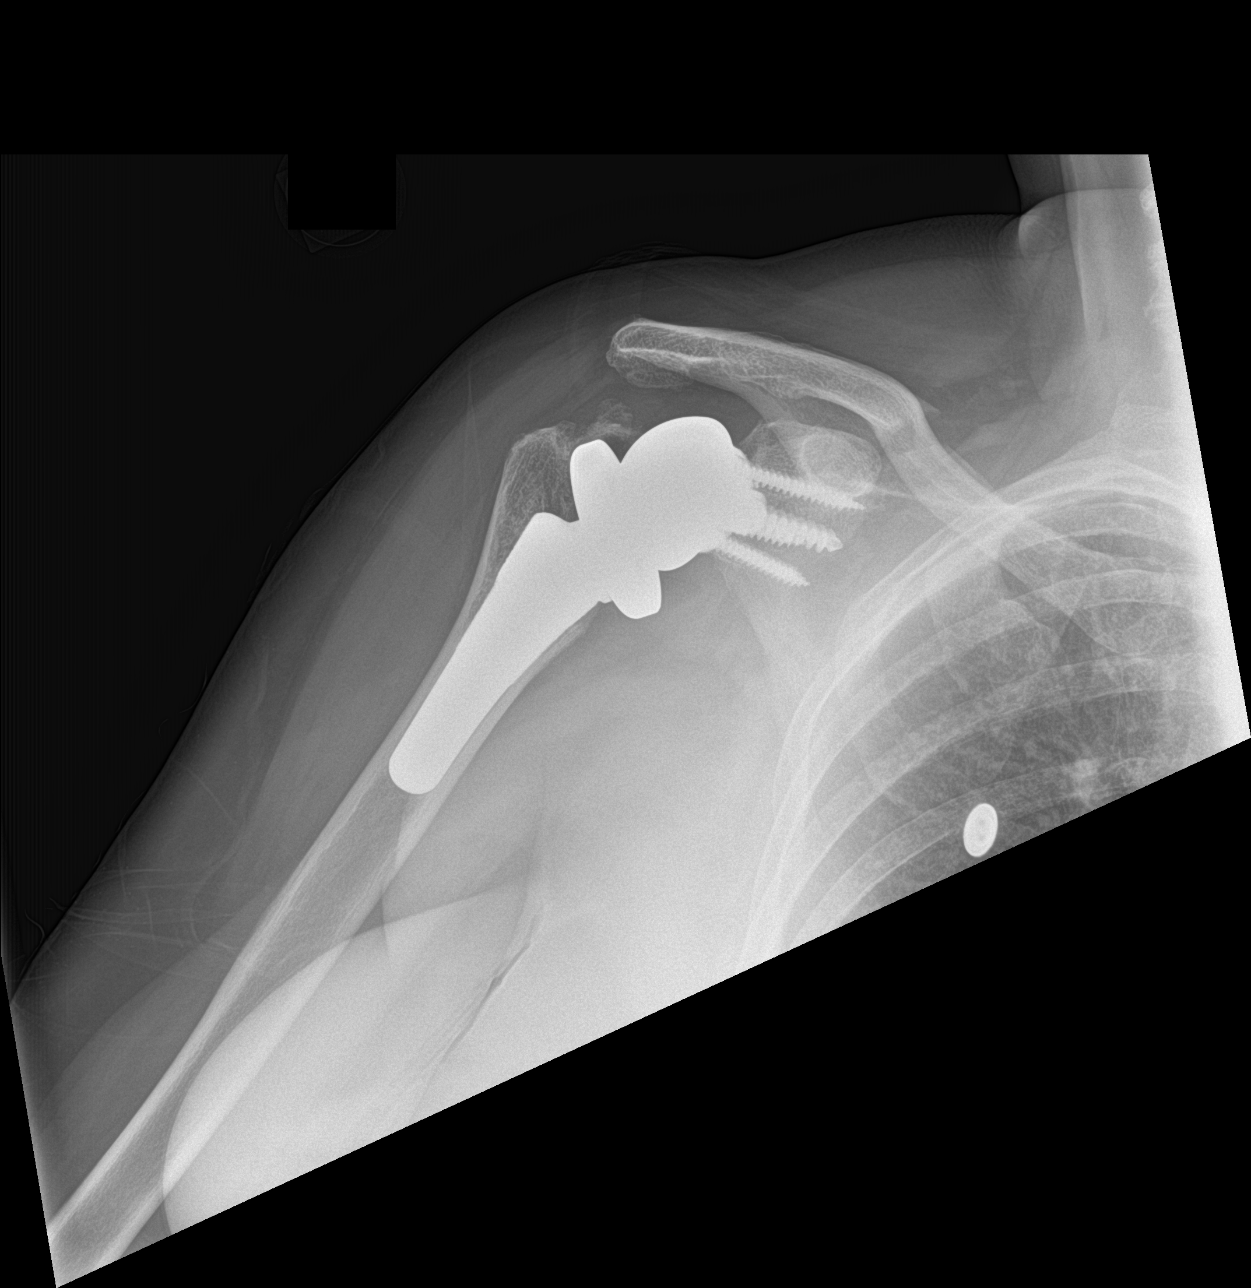

[shoulder ap]
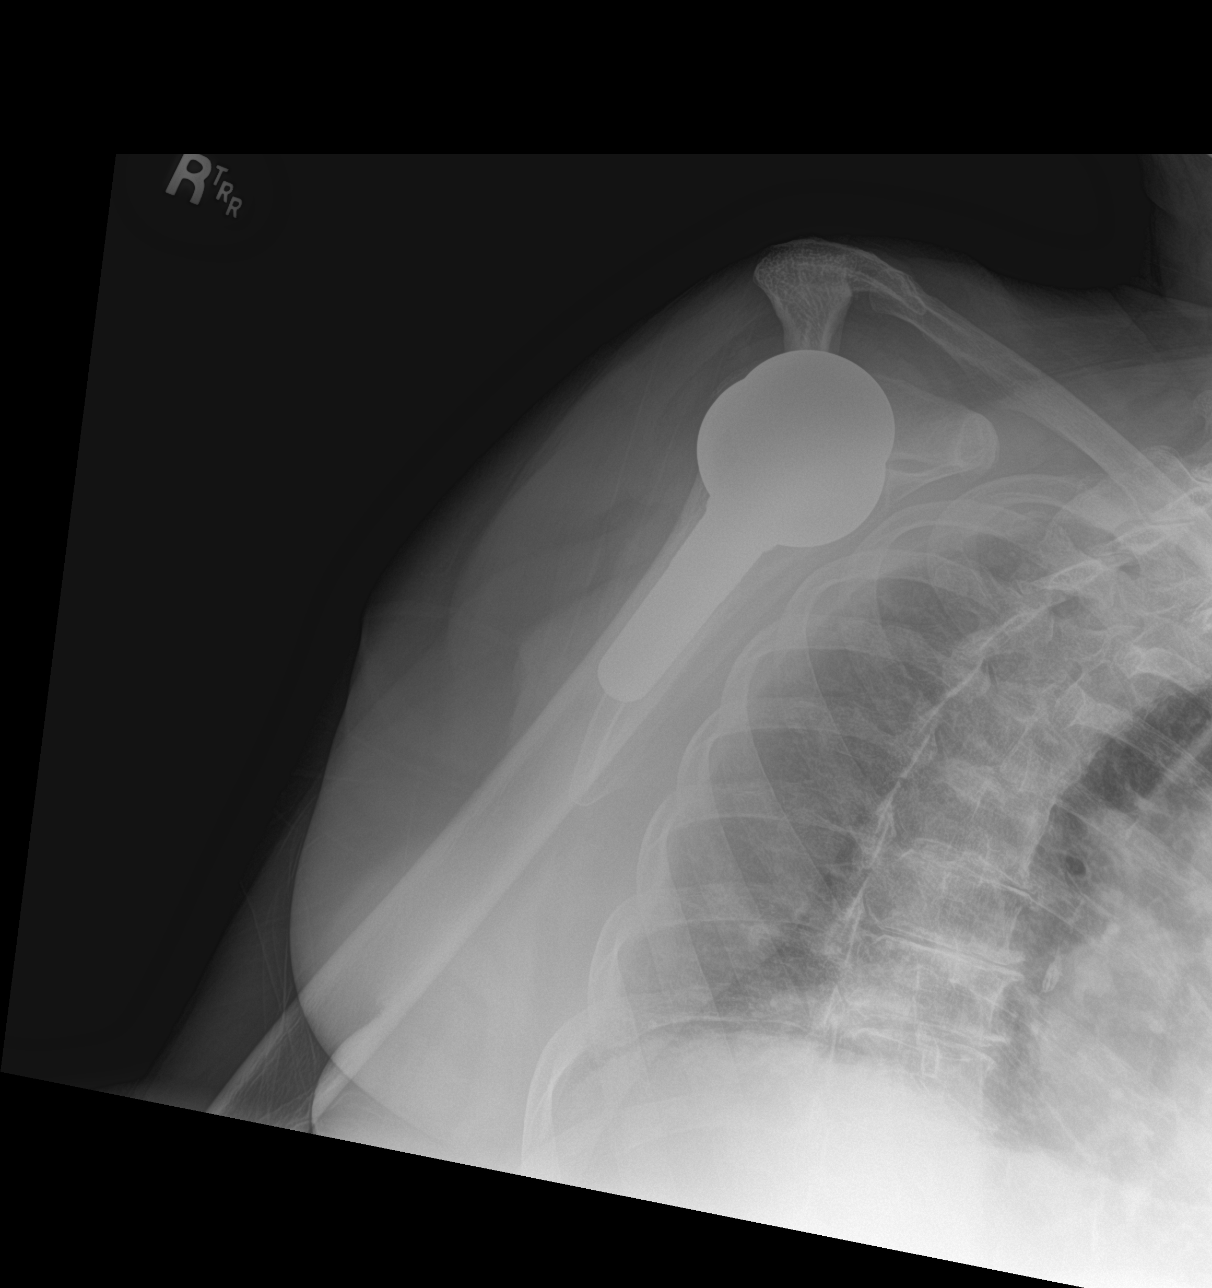

[3 of 3 positions shown; findings below may reference images not displayed]

FINDINGS: Right shoulder replacement with normal alignment.  No fracture.
IMPRESSION: Right shoulder replacement.  No acute osseous abnormality.

## 2019-09-25 NOTE — Telephone Encounter (Signed)
Patient is aware of below message and voiced her understanding.  Nothing further is needed at this time.

## 2019-09-27 DIAGNOSIS — I509 Heart failure, unspecified: Secondary | ICD-10-CM | POA: Diagnosis not present

## 2019-09-27 DIAGNOSIS — I130 Hypertensive heart and chronic kidney disease with heart failure and stage 1 through stage 4 chronic kidney disease, or unspecified chronic kidney disease: Secondary | ICD-10-CM | POA: Diagnosis not present

## 2019-09-27 DIAGNOSIS — T84018D Broken internal joint prosthesis, other site, subsequent encounter: Secondary | ICD-10-CM | POA: Diagnosis not present

## 2019-09-27 DIAGNOSIS — I350 Nonrheumatic aortic (valve) stenosis: Secondary | ICD-10-CM | POA: Diagnosis not present

## 2019-09-27 DIAGNOSIS — N183 Chronic kidney disease, stage 3 unspecified: Secondary | ICD-10-CM | POA: Diagnosis not present

## 2019-09-27 DIAGNOSIS — I2510 Atherosclerotic heart disease of native coronary artery without angina pectoris: Secondary | ICD-10-CM | POA: Diagnosis not present

## 2019-09-27 DIAGNOSIS — E1142 Type 2 diabetes mellitus with diabetic polyneuropathy: Secondary | ICD-10-CM | POA: Diagnosis not present

## 2019-09-27 DIAGNOSIS — J449 Chronic obstructive pulmonary disease, unspecified: Secondary | ICD-10-CM | POA: Diagnosis not present

## 2019-09-27 DIAGNOSIS — E1122 Type 2 diabetes mellitus with diabetic chronic kidney disease: Secondary | ICD-10-CM | POA: Diagnosis not present

## 2019-09-28 DIAGNOSIS — E1122 Type 2 diabetes mellitus with diabetic chronic kidney disease: Secondary | ICD-10-CM | POA: Diagnosis not present

## 2019-09-28 DIAGNOSIS — I350 Nonrheumatic aortic (valve) stenosis: Secondary | ICD-10-CM | POA: Diagnosis not present

## 2019-09-28 DIAGNOSIS — N183 Chronic kidney disease, stage 3 unspecified: Secondary | ICD-10-CM | POA: Diagnosis not present

## 2019-09-28 DIAGNOSIS — T84018D Broken internal joint prosthesis, other site, subsequent encounter: Secondary | ICD-10-CM | POA: Diagnosis not present

## 2019-09-28 DIAGNOSIS — I2510 Atherosclerotic heart disease of native coronary artery without angina pectoris: Secondary | ICD-10-CM | POA: Diagnosis not present

## 2019-09-28 DIAGNOSIS — E1142 Type 2 diabetes mellitus with diabetic polyneuropathy: Secondary | ICD-10-CM | POA: Diagnosis not present

## 2019-09-28 DIAGNOSIS — J449 Chronic obstructive pulmonary disease, unspecified: Secondary | ICD-10-CM | POA: Diagnosis not present

## 2019-09-28 DIAGNOSIS — I130 Hypertensive heart and chronic kidney disease with heart failure and stage 1 through stage 4 chronic kidney disease, or unspecified chronic kidney disease: Secondary | ICD-10-CM | POA: Diagnosis not present

## 2019-09-28 DIAGNOSIS — I509 Heart failure, unspecified: Secondary | ICD-10-CM | POA: Diagnosis not present

## 2019-10-04 DIAGNOSIS — I130 Hypertensive heart and chronic kidney disease with heart failure and stage 1 through stage 4 chronic kidney disease, or unspecified chronic kidney disease: Secondary | ICD-10-CM | POA: Diagnosis not present

## 2019-10-04 DIAGNOSIS — T84018D Broken internal joint prosthesis, other site, subsequent encounter: Secondary | ICD-10-CM | POA: Diagnosis not present

## 2019-10-04 DIAGNOSIS — I509 Heart failure, unspecified: Secondary | ICD-10-CM | POA: Diagnosis not present

## 2019-10-04 DIAGNOSIS — E1142 Type 2 diabetes mellitus with diabetic polyneuropathy: Secondary | ICD-10-CM | POA: Diagnosis not present

## 2019-10-04 DIAGNOSIS — N183 Chronic kidney disease, stage 3 unspecified: Secondary | ICD-10-CM | POA: Diagnosis not present

## 2019-10-04 DIAGNOSIS — I2510 Atherosclerotic heart disease of native coronary artery without angina pectoris: Secondary | ICD-10-CM | POA: Diagnosis not present

## 2019-10-04 DIAGNOSIS — I350 Nonrheumatic aortic (valve) stenosis: Secondary | ICD-10-CM | POA: Diagnosis not present

## 2019-10-04 DIAGNOSIS — E1122 Type 2 diabetes mellitus with diabetic chronic kidney disease: Secondary | ICD-10-CM | POA: Diagnosis not present

## 2019-10-04 DIAGNOSIS — J449 Chronic obstructive pulmonary disease, unspecified: Secondary | ICD-10-CM | POA: Diagnosis not present

## 2019-10-06 DIAGNOSIS — I351 Nonrheumatic aortic (valve) insufficiency: Secondary | ICD-10-CM | POA: Diagnosis not present

## 2019-10-06 DIAGNOSIS — I2510 Atherosclerotic heart disease of native coronary artery without angina pectoris: Secondary | ICD-10-CM | POA: Diagnosis not present

## 2019-10-06 DIAGNOSIS — Z79899 Other long term (current) drug therapy: Secondary | ICD-10-CM | POA: Diagnosis not present

## 2019-10-06 DIAGNOSIS — I10 Essential (primary) hypertension: Secondary | ICD-10-CM | POA: Diagnosis not present

## 2019-10-06 DIAGNOSIS — Z7951 Long term (current) use of inhaled steroids: Secondary | ICD-10-CM | POA: Diagnosis not present

## 2019-10-06 DIAGNOSIS — R600 Localized edema: Secondary | ICD-10-CM | POA: Diagnosis not present

## 2019-10-06 DIAGNOSIS — R05 Cough: Secondary | ICD-10-CM | POA: Diagnosis not present

## 2019-10-06 DIAGNOSIS — I38 Endocarditis, valve unspecified: Secondary | ICD-10-CM | POA: Diagnosis not present

## 2019-10-06 DIAGNOSIS — I080 Rheumatic disorders of both mitral and aortic valves: Secondary | ICD-10-CM | POA: Diagnosis not present

## 2019-10-06 DIAGNOSIS — I340 Nonrheumatic mitral (valve) insufficiency: Secondary | ICD-10-CM | POA: Diagnosis not present

## 2019-10-12 IMAGING — US US RENAL
1 series · 14 of 25 positions shown · non-contrast
Comparison: 10/05/2017

CLINICAL DATA: Left renal mass, history of right nephrectomy

EXAM:
RENAL / URINARY TRACT ULTRASOUND COMPLETE

[Series 2: us renal · 59 acquisitions, 14 frames shown]
[im 1/59]
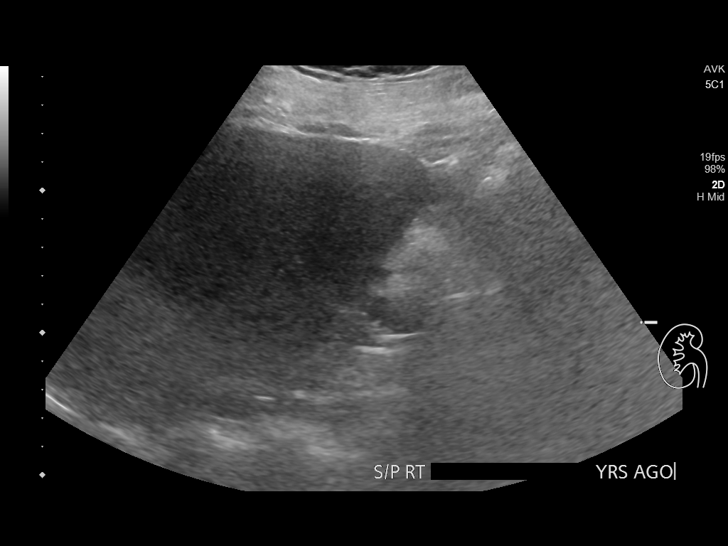
[im 5/59]
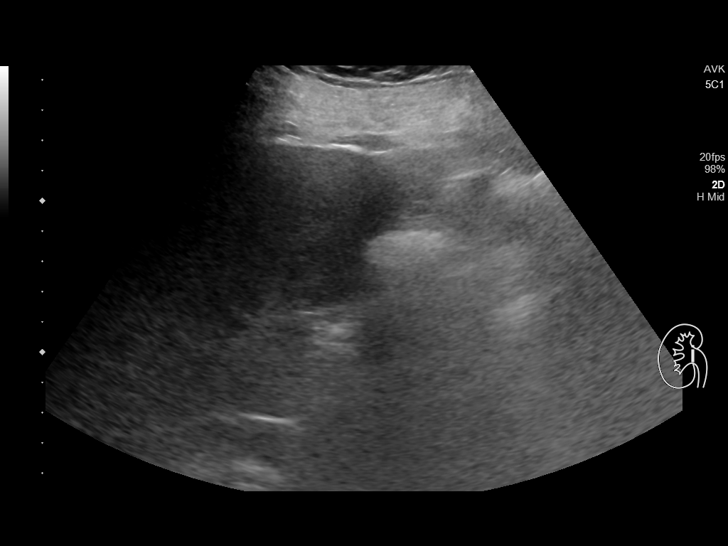
[im 10/59]
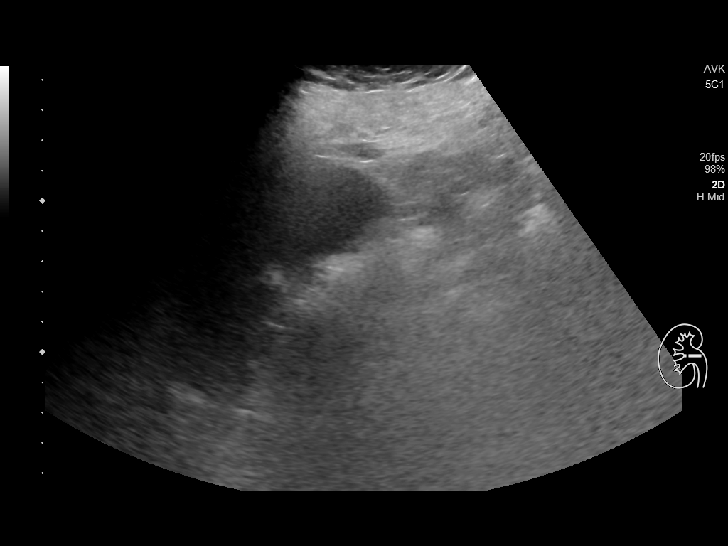
[im 15/59]
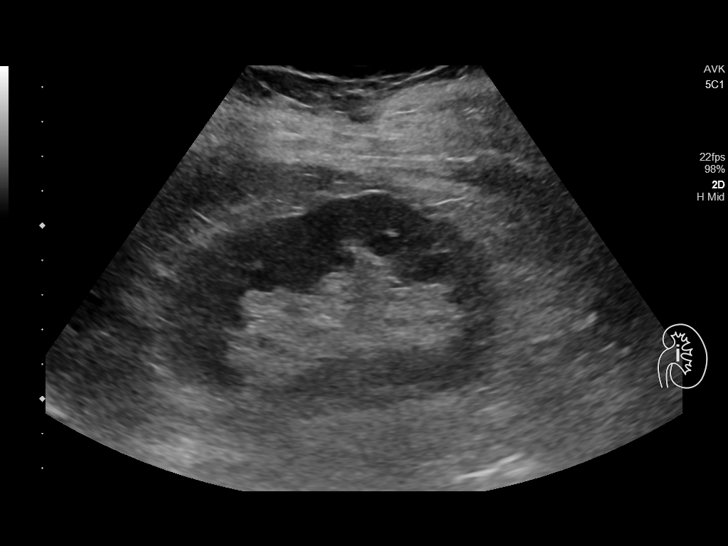
[im 20/59]
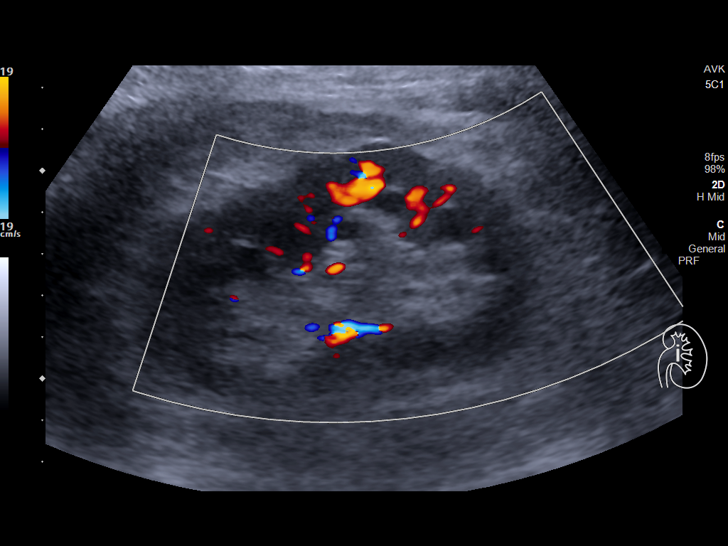
[im 22/59]
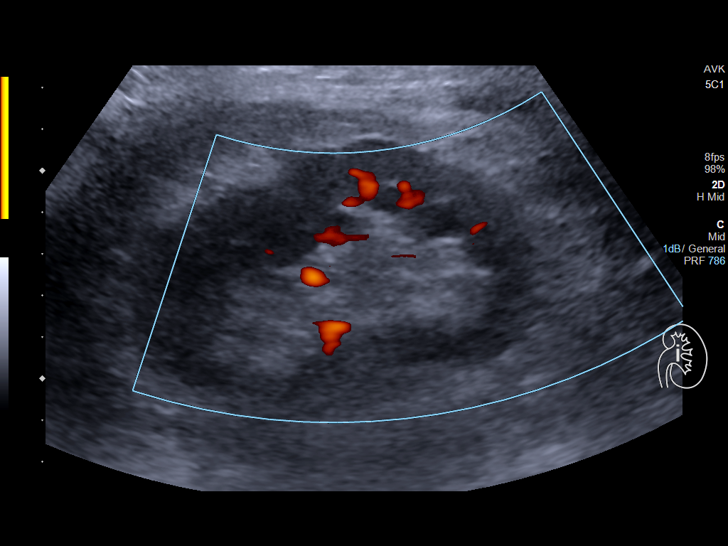
[im 27/59]
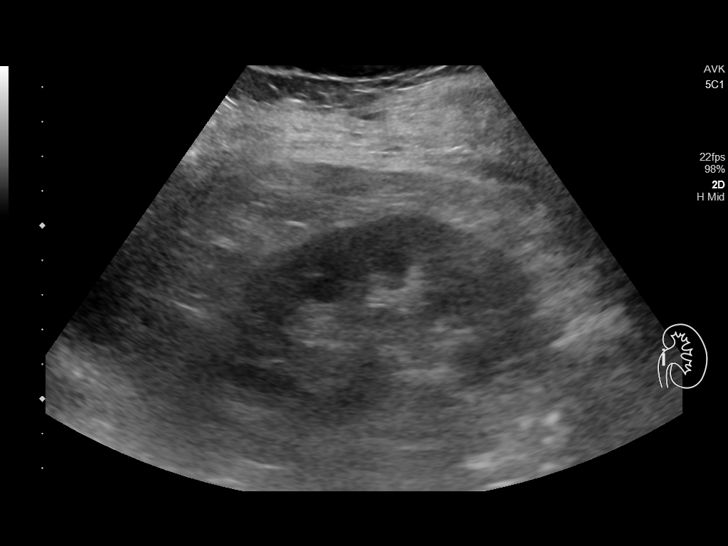
[im 32/59]
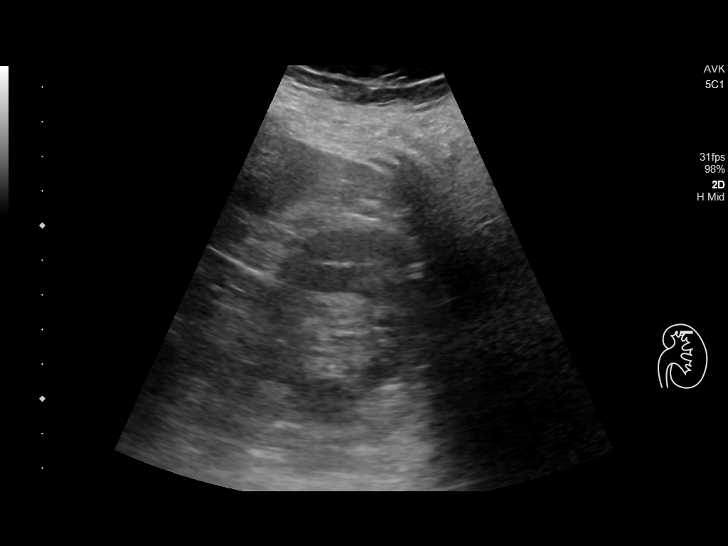
[im 37/59]
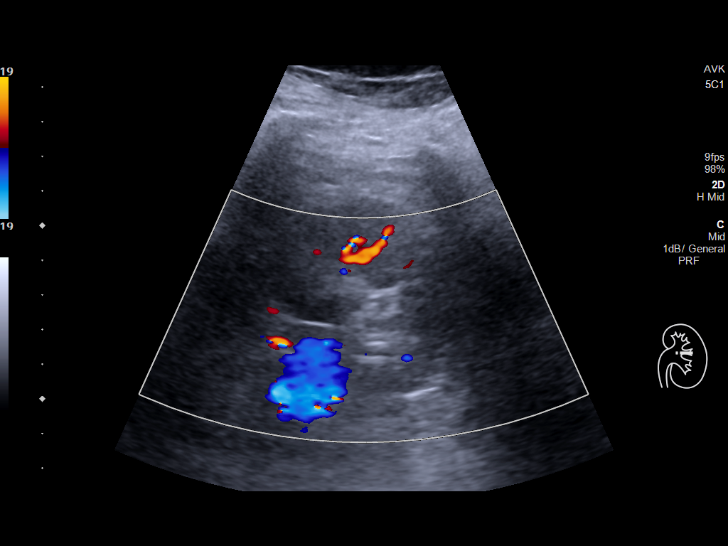
[im 39/59]
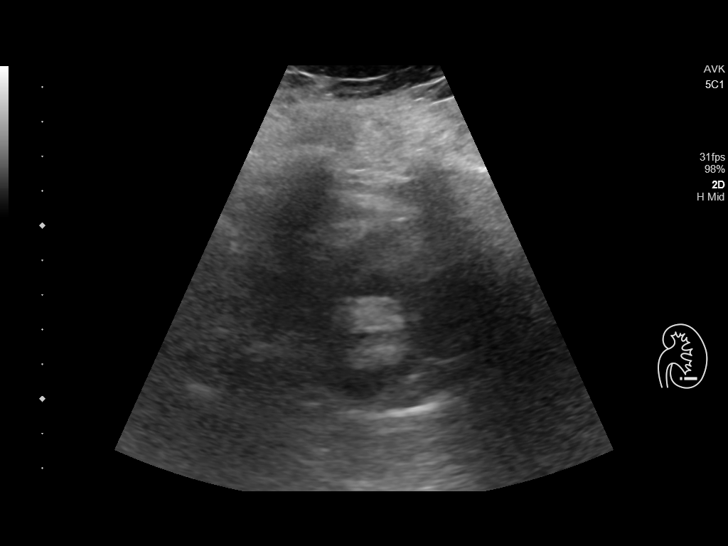
[im 44/59]
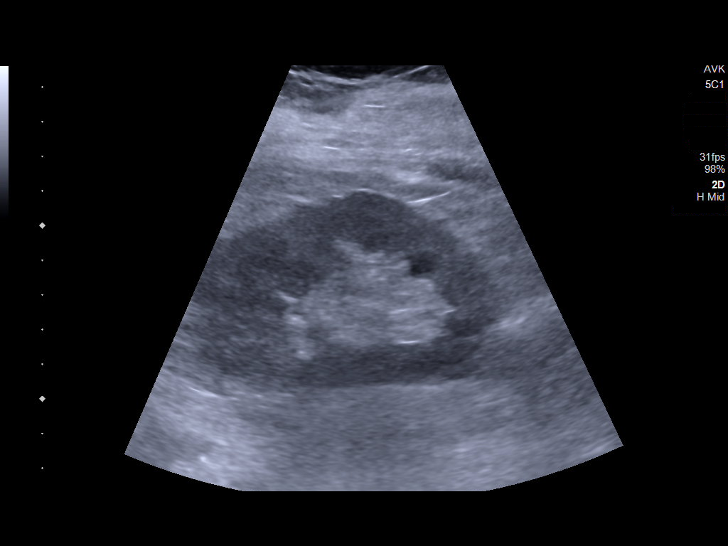
[im 49/59]
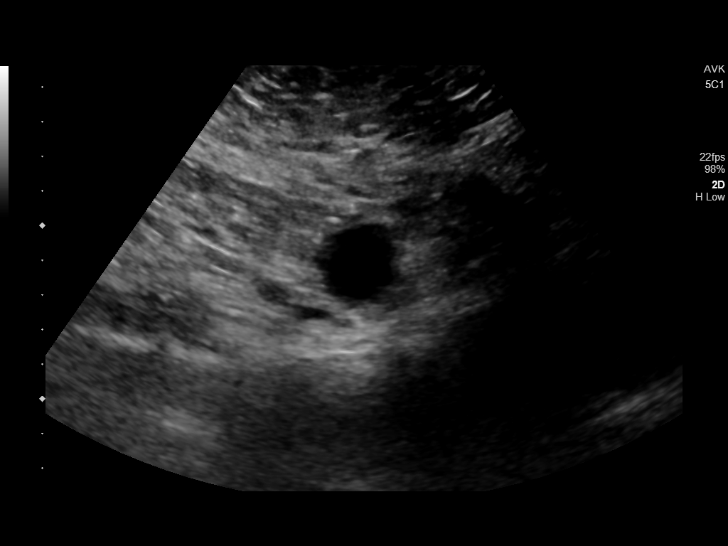
[im 54/59]
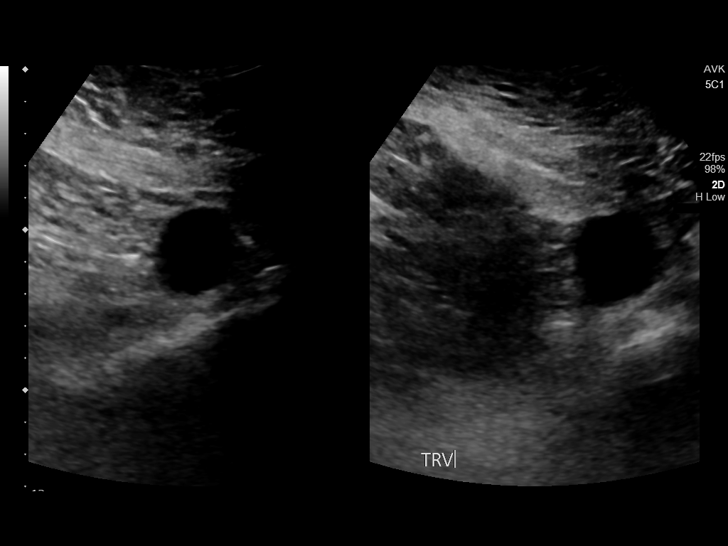
[im 59/59]
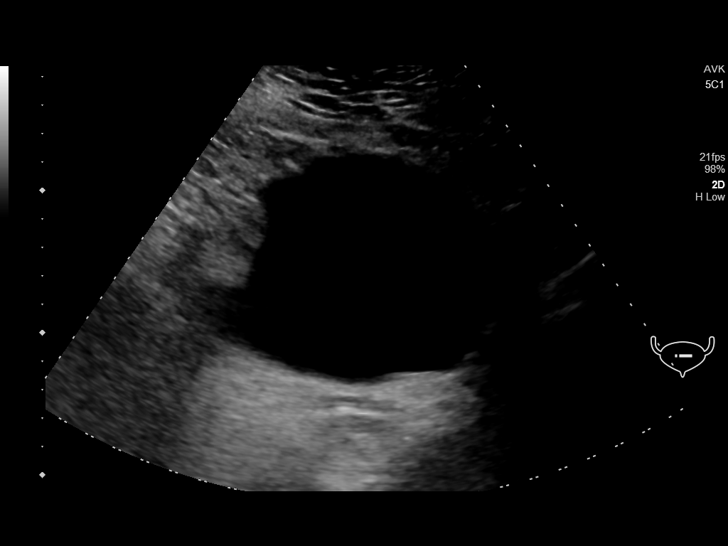

[14 of 25 positions shown; findings below may reference images not displayed]

FINDINGS: Right Kidney:

Surgically absent

Left Kidney:

Renal measurements: 9.6 x 5.0 x 4.4 cm. = volume: 132 mL.
Echogenicity within normal limits. No mass or hydronephrosis
visualized.

Bladder:

Appears normal for degree of bladder distention.

Left ovarian cyst is again identified and stable measuring 2.8 cm in
greatest dimension.
IMPRESSION: Status post right nephrectomy.

No left renal mass is seen.

Stable left ovarian cyst.

## 2019-10-12 IMAGING — CT CT CHEST HIGH RESOLUTION W/O CM
2 of 5 series · 14 of 36 positions shown, 17 images · non-contrast
Comparison: Chest CT 12/04/2014.

CLINICAL DATA: 78-year-old female with history of cough, wheezing
and shortness of breath. Evaluate for interstitial lung disease.

EXAM:
CT CHEST WITHOUT CONTRAST
TECHNIQUE: Multidetector CT imaging of the chest was performed following the
standard protocol without intravenous contrast. High resolution
imaging of the lungs, as well as inspiratory and expiratory imaging,
was performed.

[Series 2: thorax · axial · 0.57mm/px · z∈[-566,-354]mm · 11 of 123 slices shown, 14 images]
[im 11/123  mediastinal]
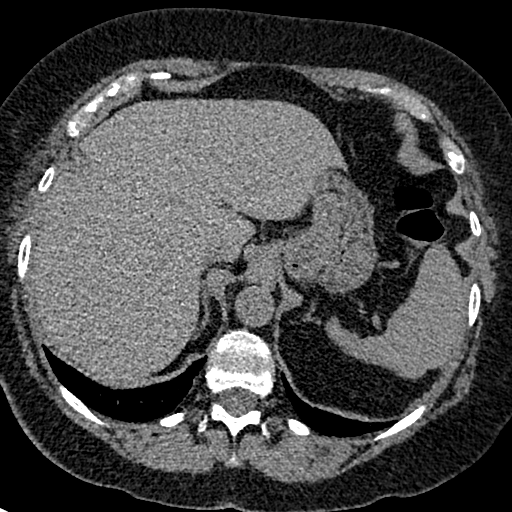
[im 11/123  lung]
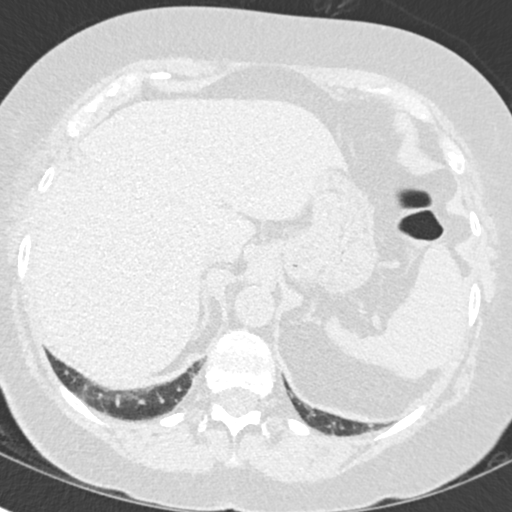
[im 22/123  lung]
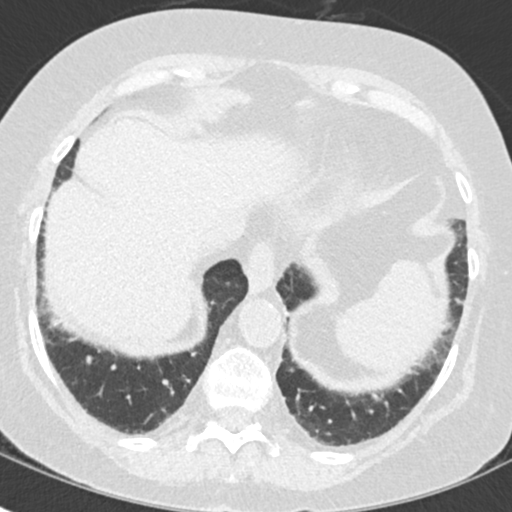
[im 32/123  lung]
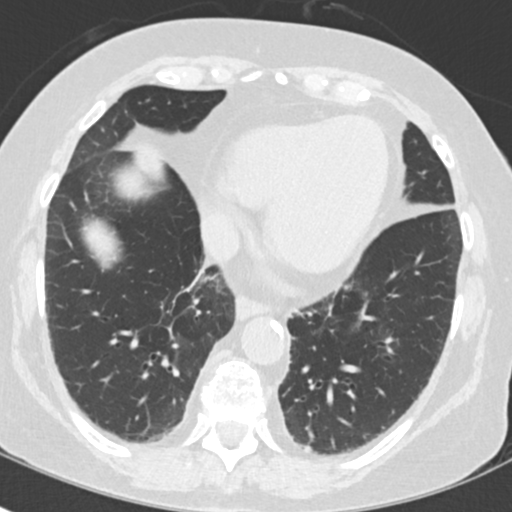
[im 43/123  lung]
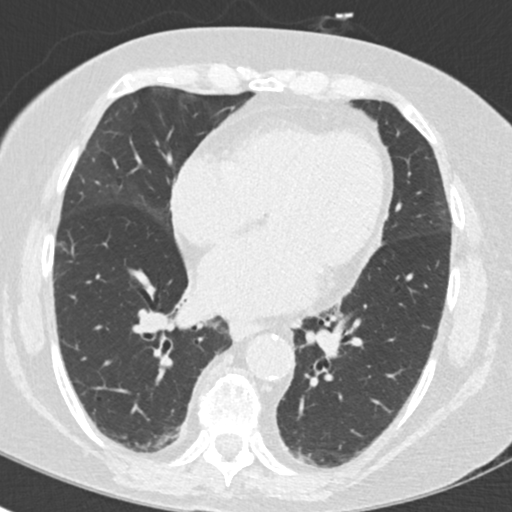
[im 54/123  mediastinal]
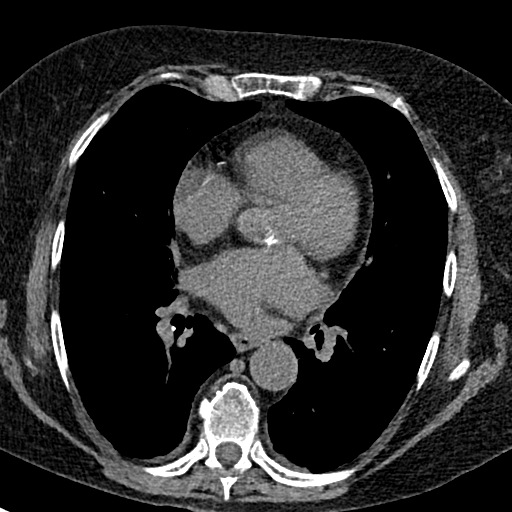
[im 54/123  lung]
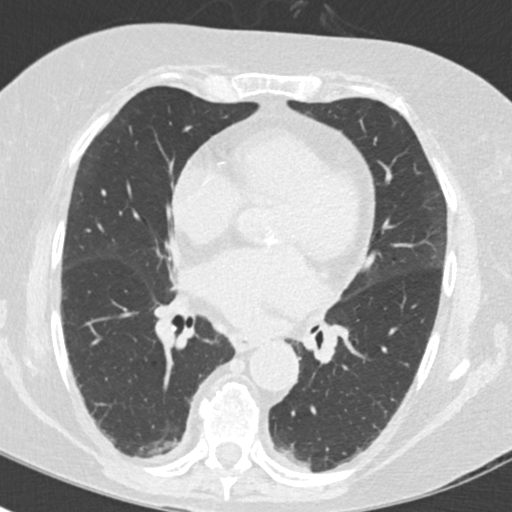
[im 64/123  lung]
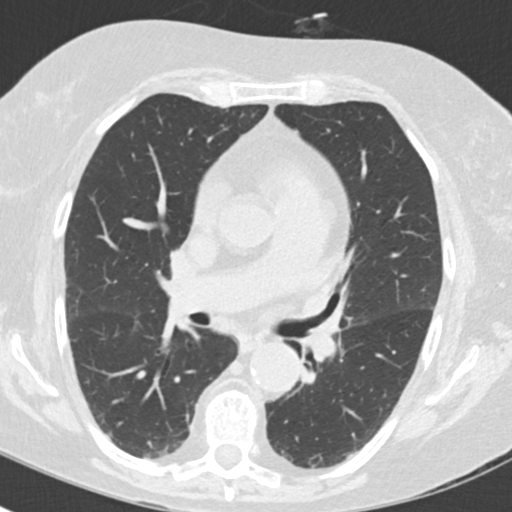
[im 75/123  lung]
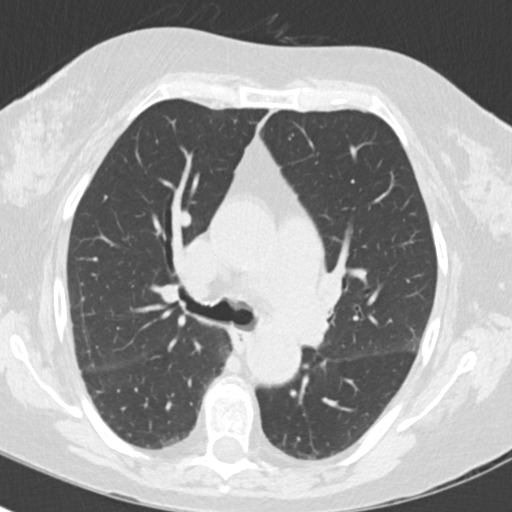
[im 85/123  lung]
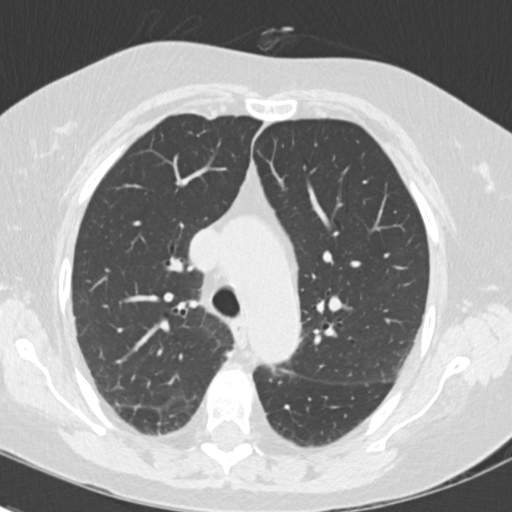
[im 96/123  mediastinal]
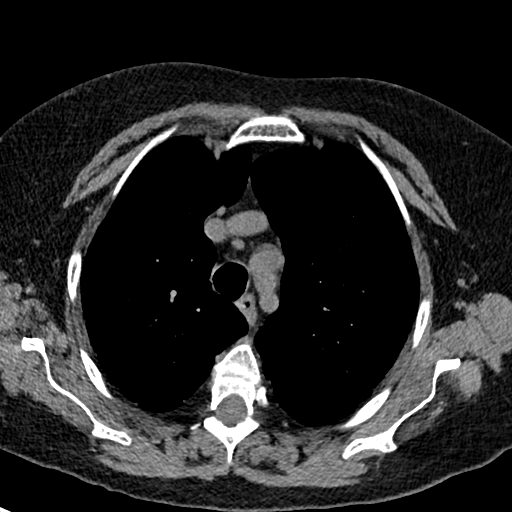
[im 96/123  lung]
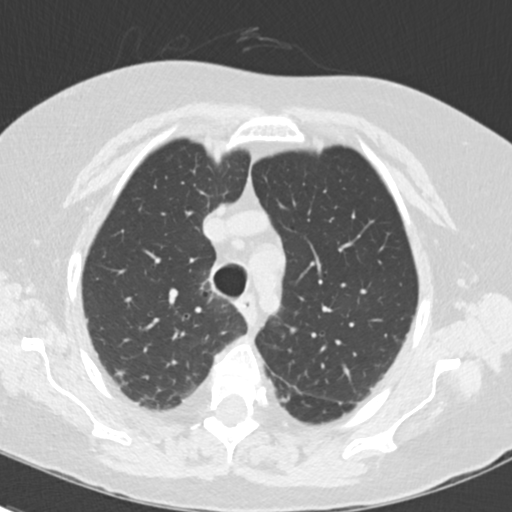
[im 107/123  lung]
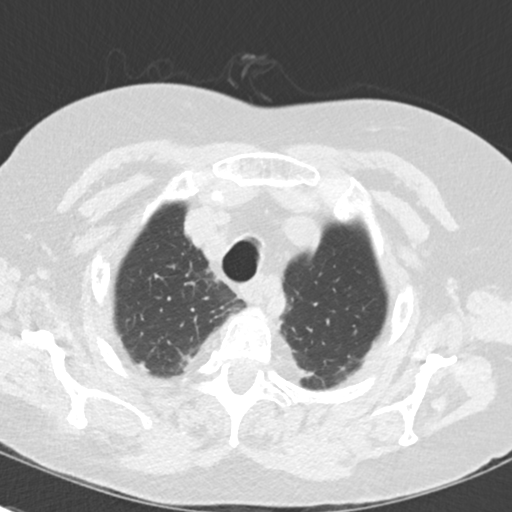
[im 117/123  lung]
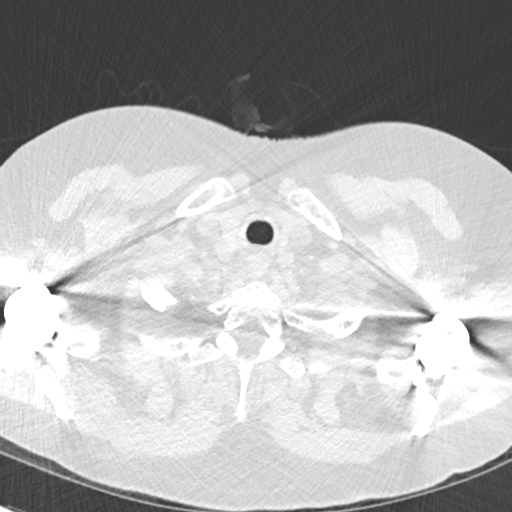

[Series 8: coronal · coronal · 0.49mm/px · 3 of 100 slices shown]
[im 20/100  lung]
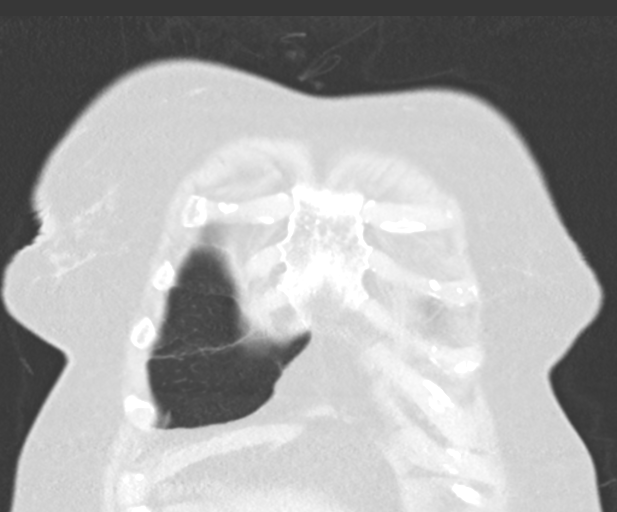
[im 40/100  lung]
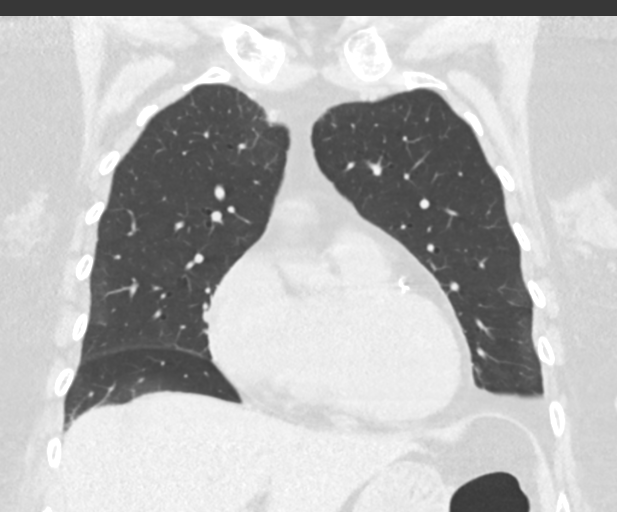
[im 60/100  lung]
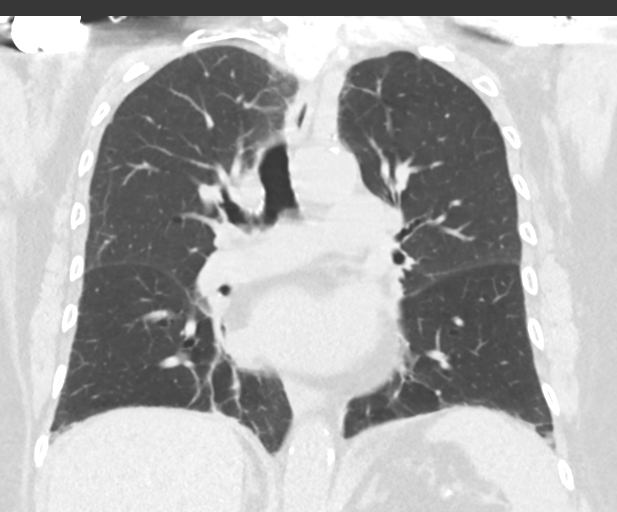

[14 of 36 positions shown; findings below may reference images not displayed]

FINDINGS: Cardiovascular: Heart size is mildly enlarged. There is no
significant pericardial fluid, thickening or pericardial
calcification. There is aortic atherosclerosis, as well as
atherosclerosis of the great vessels of the mediastinum and the
coronary arteries, including calcified atherosclerotic plaque in the
left main, left anterior descending, left circumflex and right
coronary arteries. Calcifications of the aortic valve and of the
inferior mitral annulus.

Mediastinum/Nodes: No pathologically enlarged mediastinal or hilar
lymph nodes. Please note that accurate exclusion of hilar adenopathy
is limited on noncontrast CT scans. Esophagus is unremarkable in
appearance. No axillary lymphadenopathy.

Lungs/Pleura: High-resolution images demonstrates some very mild
areas of peripheral predominant ground-glass attenuation and septal
thickening scattered throughout the mid to lower lungs bilaterally.
No traction bronchiectasis, bronchiolectasis or honeycombing.
Inspiratory and expiratory imaging is unremarkable. 5 mm left upper
lobe pulmonary nodule (axial image 48 of series 3), stable dating
back to 2851, definitively benign. No other larger more suspicious
appearing pulmonary nodules or masses are noted. No acute
consolidative airspace disease. No pleural effusions.

Upper Abdomen: Unremarkable.

Musculoskeletal: Status post bilateral shoulder arthroplasty. There
are no aggressive appearing lytic or blastic lesions noted in the
visualized portions of the skeleton.
IMPRESSION: 1. The appearance of the lungs does suggest very early or mild
interstitial lung disease, with a spectrum of findings considered
indeterminate for usual interstitial pneumonia (UIP) per current ATS
guidelines. This could reflect very mild nonspecific interstitial
pneumonia (NSIP), although very early UIP is not excluded. Repeat
high-resolution chest CT is recommended in 12 months to assess for
temporal changes in the appearance of the lung parenchyma.
2. Aortic atherosclerosis, in addition to left main and 3 vessel
coronary artery disease. Please note that although the presence of
coronary artery calcium documents the presence of coronary artery
disease, the severity of this disease and any potential stenosis
cannot be assessed on this non-gated CT examination. Assessment for
potential risk factor modification, dietary therapy or pharmacologic
therapy may be warranted, if clinically indicated.
3. There are calcifications of the aortic valve and mitral annulus.
Echocardiographic correlation for evaluation of potential valvular
dysfunction may be warranted if clinically indicated.
4. Mild cardiomegaly.

Aortic Atherosclerosis ([id number].S).

## 2019-10-13 DIAGNOSIS — I2510 Atherosclerotic heart disease of native coronary artery without angina pectoris: Secondary | ICD-10-CM | POA: Diagnosis not present

## 2019-10-13 DIAGNOSIS — E1122 Type 2 diabetes mellitus with diabetic chronic kidney disease: Secondary | ICD-10-CM | POA: Diagnosis not present

## 2019-10-13 DIAGNOSIS — T84018D Broken internal joint prosthesis, other site, subsequent encounter: Secondary | ICD-10-CM | POA: Diagnosis not present

## 2019-10-13 DIAGNOSIS — N183 Chronic kidney disease, stage 3 unspecified: Secondary | ICD-10-CM | POA: Diagnosis not present

## 2019-10-13 DIAGNOSIS — E1142 Type 2 diabetes mellitus with diabetic polyneuropathy: Secondary | ICD-10-CM | POA: Diagnosis not present

## 2019-10-13 DIAGNOSIS — J449 Chronic obstructive pulmonary disease, unspecified: Secondary | ICD-10-CM | POA: Diagnosis not present

## 2019-10-13 DIAGNOSIS — I509 Heart failure, unspecified: Secondary | ICD-10-CM | POA: Diagnosis not present

## 2019-10-13 DIAGNOSIS — I350 Nonrheumatic aortic (valve) stenosis: Secondary | ICD-10-CM | POA: Diagnosis not present

## 2019-10-13 DIAGNOSIS — I130 Hypertensive heart and chronic kidney disease with heart failure and stage 1 through stage 4 chronic kidney disease, or unspecified chronic kidney disease: Secondary | ICD-10-CM | POA: Diagnosis not present

## 2019-10-16 DIAGNOSIS — M810 Age-related osteoporosis without current pathological fracture: Secondary | ICD-10-CM | POA: Diagnosis not present

## 2019-10-17 ENCOUNTER — Telehealth: Payer: Self-pay | Admitting: Pulmonary Disease

## 2019-10-17 NOTE — Telephone Encounter (Signed)
Spoke with patient regarding prior message. Patient wanted a appt with Dr.Gonzalez anytime after 12:00pm tomorrow Advised patient we would need to get approval from [place] to use one of her held spot's. Patient is aware.  Dr.Gonzalez can you please advise.  Thank you

## 2019-10-18 ENCOUNTER — Encounter: Payer: Self-pay | Admitting: Pulmonary Disease

## 2019-10-18 ENCOUNTER — Ambulatory Visit: Payer: Medicare PPO | Admitting: Pulmonary Disease

## 2019-10-18 ENCOUNTER — Ambulatory Visit
Admission: RE | Admit: 2019-10-18 | Discharge: 2019-10-18 | Disposition: A | Discharge status: Home / Self Care | Payer: Medicare PPO | Source: Ambulatory Visit | Attending: Pulmonary Disease | Admitting: Pulmonary Disease

## 2019-10-18 ENCOUNTER — Ambulatory Visit: Payer: Medicare Other | Admitting: Urology

## 2019-10-18 ENCOUNTER — Ambulatory Visit
Admission: RE | Admit: 2019-10-18 | Discharge: 2019-10-18 | Disposition: A | Discharge status: Home / Self Care | Payer: Medicare PPO | Attending: Pulmonary Disease | Admitting: Pulmonary Disease

## 2019-10-18 ENCOUNTER — Other Ambulatory Visit: Payer: Self-pay

## 2019-10-18 VITALS — BP 120/58 | HR 92 | Temp 97.1°F | Ht 61.5 in | Wt 161.6 lb

## 2019-10-18 DIAGNOSIS — J9811 Atelectasis: Secondary | ICD-10-CM | POA: Diagnosis not present

## 2019-10-18 DIAGNOSIS — J441 Chronic obstructive pulmonary disease with (acute) exacerbation: Secondary | ICD-10-CM

## 2019-10-18 DIAGNOSIS — J849 Interstitial pulmonary disease, unspecified: Secondary | ICD-10-CM

## 2019-10-18 DIAGNOSIS — R05 Cough: Secondary | ICD-10-CM

## 2019-10-18 DIAGNOSIS — R059 Cough, unspecified: Secondary | ICD-10-CM

## 2019-10-18 DIAGNOSIS — R0602 Shortness of breath: Secondary | ICD-10-CM | POA: Diagnosis not present

## 2019-10-18 MED ORDER — PREDNISONE 10 MG (21) PO TBPK
ORAL_TABLET | ORAL | 0 refills | Status: DC
Start: 2019-10-18 — End: 2019-12-07

## 2019-10-18 MED ORDER — AMOXICILLIN-POT CLAVULANATE 875-125 MG PO TABS: 1.0000 | ORAL_TABLET | Freq: Two times a day (BID) | ORAL | 0 refills | Status: AC

## 2019-10-18 NOTE — Telephone Encounter (Signed)
See if she can be put in around 230 today.

## 2019-10-18 NOTE — Progress Notes (Signed)
Subjective:    Patient ID: Kerri Carter, female    DOB: 1940-06-25, 79 y.o.   MRN: [id number]  HPI Patient is a 79 year old former smoker who follows for the issue of cough, asthma/COPD overlap syndrome and mild interstitial lung disease related to rheumatoid arthritis.  This is a work in visit due to increasing cough.  She has not been using her nebulizers as previously prescribed, regularly.     States that her cough feels "different" but she cannot tell me why.  She has not had any fevers, chills or sweats.  She has not had any sick contacts.  No anosmia or dysgeusia.  She does not appear toxic or acutely ill.  That note she is also on Humira which can make her respiratory symptoms more pronounced.    Review of Systems A 10 point review of systems was performed and it is as noted above otherwise negative.  Allergies  Allergen Reactions  . Ceftin [Cefuroxime Axetil] Anaphylaxis  . Lisinopril Anaphylaxis  . Morphine Other (See Comments)    Per patient, low blood pressure issues that requires action to raise it back up. Can take small infrequent doses  . Sulfasalazine Anaphylaxis  . Aspirin Other (See Comments)    Sulfasalazine allergy cross reacts  . Antihistamines, Chlorpheniramine-Type Other (See Comments)    Makes pt hyper  . Antivert [Meclizine Hcl] Other (See Comments)    Bladder will not empty  . Decongestant [Pseudoephedrine Hcl] Other (See Comments)    Makes pt hyper  . Doxycycline Other (See Comments)    GI upset  . Polymyxin B Other (See Comments)    Medication was in eye drops.  . Sulfa Antibiotics Other (See Comments)    Face swelling  . Xarelto [Rivaroxaban] Other (See Comments)    Stomach burning, bleeding, and tar in stool  . Adhesive [Tape] Rash  . Iodine Hives and Rash    Per patient allergy is to contrast dye only, she is able to use betadine scrubs.  [place] [Levofloxacin In D5w] Rash  . Tetanus Toxoids Rash and Other (See Comments)    Fever and  hot to touch at injection site   Current Meds  Medication Sig  . Adalimumab (HUMIRA PEN) 40 MG/0.4ML PNKT Inject 40 mg into the skin every 14 (fourteen) days.   Marland Kitchen albuterol (VENTOLIN HFA) 108 (90 Base) MCG/ACT inhaler Inhale 2 puffs into the lungs every 6 (six) hours as needed.  Marland Kitchen amLODipine (NORVASC) 2.5 MG tablet Take 2.5 mg by mouth daily.  . benzonatate (TESSALON) 200 MG capsule Take 1 capsule (200 mg total) by mouth 3 (three) times daily as needed for cough.  . budesonide (PULMICORT) 0.25 MG/2ML nebulizer solution Take 2 mLs (0.25 mg total) by nebulization 2 (two) times daily.  Marland Kitchen BYSTOLIC 10 MG tablet TAKE 1 TABLET BY MOUTH EVERY DAY  . Cholecalciferol 50 MCG (2000 UT) CAPS Take by mouth.  . dicyclomine (BENTYL) 10 MG capsule TAKE 1 CAPSULE (10 MG TOTAL) BY MOUTH 4 (FOUR) TIMES DAILY - BEFORE MEALS AND AT BEDTIME. (Patient taking differently: Take 20 mg by mouth in the morning and at bedtime. )  . escitalopram (LEXAPRO) 10 MG tablet Take 1 tablet (10 mg total) by mouth daily.  . famotidine (PEPCID) 20 MG tablet Take 1 tablet (20 mg total) by mouth daily. Before breakfast or dinner  . fluticasone (FLONASE) 50 MCG/ACT nasal spray Place 1 spray into both nostrils 2 (two) times daily.  Marland Kitchen gabapentin (NEURONTIN) 300 MG  capsule Take 2-3 capsules (600-900 mg total) by mouth See admin instructions. Take 600 mg by mouth in the morning and 900 mg at bedtime (Patient taking differently: Take 600-900 mg by mouth See admin instructions. Take 600 mg by mouth in the morning and bedtime)  . ipratropium-albuterol (DUONEB) 0.5-2.5 (3) MG/3ML SOLN Take 3 mLs by nebulization 3 (three) times daily as needed.  . lamoTRIgine (LAMICTAL) 100 MG tablet Take 1 tablet (100 mg total) by mouth 2 (two) times daily.  Marland Kitchen leflunomide (ARAVA) 20 MG tablet Take 1 tablet (20 mg total) by mouth daily.  Marland Kitchen lovastatin (MEVACOR) 20 MG tablet Take 1 tablet (20 mg total) by mouth at bedtime.  . montelukast (SINGULAIR) 10 MG tablet Take  1 tablet (10 mg total) by mouth daily.  . multivitamin-lutein (OCUVITE-LUTEIN) CAPS capsule Take 1 capsule by mouth daily.   Marland Kitchen MYRBETRIQ 50 MG TB24 tablet TAKE 1 TABLET BY MOUTH EVERYDAY AT BEDTIME (Patient taking differently: 50 mg daily. )  . pantoprazole (PROTONIX) 40 MG tablet Take 1 tablet (40 mg total) by mouth 2 (two) times daily. 30 minutes before food. Note reduction in frequency  . QUEtiapine (SEROQUEL) 25 MG tablet Take 1 tablet (25 mg total) by mouth at bedtime.  . sucralfate (CARAFATE) 1 g tablet Take 1 tablet (1 g total) by mouth 4 (four) times daily. TAKE 1 TABLET BY MOUTH 4 TIMES A DAY WITH MEALS AND AT BEDTIME (Patient taking differently: Take 1 g by mouth 2 (two) times daily. TAKE 1 TABLET BY MOUTH 4 TIMES A DAY WITH MEALS AND AT BEDTIME)   Immunization History  Administered Date(s) Administered  . Fluad Quad(high Dose 65+) 10/03/2018, 10/16/2019  . Influenza Split 10/04/2012, 10/25/2013, 10/28/2016  . Influenza Whole 11/03/2011  . Influenza, High Dose Seasonal PF 10/04/2015, 11/03/2016  . Influenza,inj,Quad PF,6+ Mos 10/31/2014  . Influenza-Unspecified 01/02/2010, 11/24/2011, 10/21/2013, 10/31/2014, 09/28/2017  . Pneumococcal Conjugate-13 10/21/2013  . Pneumococcal Polysaccharide-23 11/07/2015   Had Covid diagnosed 06 February 2019.  States has "allergies" to components of the vaccine.     Objective:   Physical Exam BP (!) 120/58 (BP Location: Left Arm, Cuff Size: Normal)   Pulse 92   Temp (!) 97.1 F (36.2 C) (Temporal)   Ht 5' 1.5" (1.562 m)   Wt 161 lb 9.6 oz (73.3 kg)   SpO2 94%   BMI 30.04 kg/m   GENERAL: Well-developed, well-nourished elderly woman, she presents today in a transport chair. Not ill appearing.  Not toxic appearing. HEAD: Normocephalic, atraumatic.  EYES: Pupils equal, round, reactive to light.  No scleral icterus.  MOUTH: Nose/mouth/throat not examined due to masking requirements for COVID 19. NECK: Supple. No thyromegaly. Trachea midline. No  JVD.  No adenopathy. PULMONARY: Coarse breath sounds throughout, faint dry crackles at the bases more pronounced on the left base. CARDIOVASCULAR: S1 and S2. Regular rate and rhythm.  Grade 1/6 systolic ejection murmur left sternal border. GASTROINTESTINAL: Benign MUSCULOSKELETAL: Mild changes of RA both hands, no clubbing, no edema.  Limited range of motion of joints due to RA. NEUROLOGIC: No overt focal deficits.  Awake, alert, speech is fluent. SKIN: Intact,warm,dry. PSYCH: Mood and behavior appropriate     Assessment & Plan:     ICD-10-CM   1. Cough  R05    Appears to be due to exacerbation from COPD Likely acute bronchitis  2. COPD with acute exacerbation (HCC)  J44.1    Augmentin 875/125 twice daily x10 days Patient intolerant of NUMEROUS antibiotics Prednisone taper pack  3. Interstitial pulmonary disease ([place])  J84.9    This issue adds complexity to her management   Orders Placed This Encounter  Procedures  . DG Chest 2 View    Standing Status:   Future    Standing Expiration Date:   04/16/2020    Order Specific Question:   Reason for Exam (SYMPTOM  OR DIAGNOSIS REQUIRED)    Answer:   RULE OUT PNA    Order Specific Question:   Preferred imaging location?    Answer:   [facility] ordered this encounter  Medications  . predniSONE (STERAPRED UNI-PAK 21 TAB) 10 MG (21) TBPK tablet    Sig: Take as directed in the package, this is a taper pack.    Dispense:  1 each    Refill:  0  . amoxicillin-clavulanate (AUGMENTIN) 875-125 MG tablet    Sig: Take 1 tablet by mouth 2 (two) times daily for 7 days.    Dispense:  14 tablet    Refill:  0    Pt. States she can take amoxicillin without problems    Await results of chest x-ray.  Suspect she has an acute bronchitis.  Will treat with prednisone and amoxicillin (only antibiotic she tolerates well) and in 4 to 6 weeks time with me or the APP.  She is to contact us prior to that time should any new difficulties  arise or if her symptoms fail to improve or worsen in the interim.  Renold Don, MD [facility]   *This note was dictated using voice recognition software/Dragon.  Despite best efforts to proofread, errors can occur which can change the meaning.  Any change was purely unintentional.

## 2019-10-18 NOTE — Telephone Encounter (Signed)
Patient reports of a non prod cough and increased sob. Patient stated that her cough is different, she has never experienced a cough like this.  Cough occurs throughout the day but worsens at night.  Denied fever, chills, sweats or additional symptoms.  Patient has been scheduled for 10/18/2019 at 2:30- okay per Dr. Patsey Berthold.  Nothing further is needed at this time.

## 2019-10-18 NOTE — Patient Instructions (Addendum)
You will have a chest x-ray performed today to make sure there is no pneumonia.  You will receive the prednisone taper pack, take as directed.  Also have written for some Augmentin which is amoxicillin, twice a day for 7 days.   Follow-up with me or the nurse practitioner in 4 to 6 weeks.

## 2019-10-24 DIAGNOSIS — I350 Nonrheumatic aortic (valve) stenosis: Secondary | ICD-10-CM | POA: Diagnosis not present

## 2019-10-24 DIAGNOSIS — E1122 Type 2 diabetes mellitus with diabetic chronic kidney disease: Secondary | ICD-10-CM | POA: Diagnosis not present

## 2019-10-24 DIAGNOSIS — I2510 Atherosclerotic heart disease of native coronary artery without angina pectoris: Secondary | ICD-10-CM | POA: Diagnosis not present

## 2019-10-24 DIAGNOSIS — N183 Chronic kidney disease, stage 3 unspecified: Secondary | ICD-10-CM | POA: Diagnosis not present

## 2019-10-24 DIAGNOSIS — E1142 Type 2 diabetes mellitus with diabetic polyneuropathy: Secondary | ICD-10-CM | POA: Diagnosis not present

## 2019-10-24 DIAGNOSIS — I130 Hypertensive heart and chronic kidney disease with heart failure and stage 1 through stage 4 chronic kidney disease, or unspecified chronic kidney disease: Secondary | ICD-10-CM | POA: Diagnosis not present

## 2019-10-24 DIAGNOSIS — T84018D Broken internal joint prosthesis, other site, subsequent encounter: Secondary | ICD-10-CM | POA: Diagnosis not present

## 2019-10-24 DIAGNOSIS — J449 Chronic obstructive pulmonary disease, unspecified: Secondary | ICD-10-CM | POA: Diagnosis not present

## 2019-10-24 DIAGNOSIS — I509 Heart failure, unspecified: Secondary | ICD-10-CM | POA: Diagnosis not present

## 2019-10-25 ENCOUNTER — Ambulatory Visit: Payer: Medicare PPO | Admitting: Urology

## 2019-10-25 ENCOUNTER — Encounter: Payer: Self-pay | Admitting: Pulmonary Disease

## 2019-10-25 DIAGNOSIS — J849 Interstitial pulmonary disease, unspecified: Secondary | ICD-10-CM | POA: Diagnosis not present

## 2019-10-25 DIAGNOSIS — J449 Chronic obstructive pulmonary disease, unspecified: Secondary | ICD-10-CM | POA: Diagnosis not present

## 2019-10-26 DIAGNOSIS — M0579 Rheumatoid arthritis with rheumatoid factor of multiple sites without organ or systems involvement: Secondary | ICD-10-CM | POA: Diagnosis not present

## 2019-10-26 DIAGNOSIS — Z79899 Other long term (current) drug therapy: Secondary | ICD-10-CM | POA: Diagnosis not present

## 2019-10-27 DIAGNOSIS — N183 Chronic kidney disease, stage 3 unspecified: Secondary | ICD-10-CM | POA: Diagnosis not present

## 2019-10-27 DIAGNOSIS — E1142 Type 2 diabetes mellitus with diabetic polyneuropathy: Secondary | ICD-10-CM | POA: Diagnosis not present

## 2019-10-27 DIAGNOSIS — I130 Hypertensive heart and chronic kidney disease with heart failure and stage 1 through stage 4 chronic kidney disease, or unspecified chronic kidney disease: Secondary | ICD-10-CM | POA: Diagnosis not present

## 2019-10-27 DIAGNOSIS — E1122 Type 2 diabetes mellitus with diabetic chronic kidney disease: Secondary | ICD-10-CM | POA: Diagnosis not present

## 2019-10-27 DIAGNOSIS — J449 Chronic obstructive pulmonary disease, unspecified: Secondary | ICD-10-CM | POA: Diagnosis not present

## 2019-10-27 DIAGNOSIS — T84018D Broken internal joint prosthesis, other site, subsequent encounter: Secondary | ICD-10-CM | POA: Diagnosis not present

## 2019-10-27 DIAGNOSIS — I2510 Atherosclerotic heart disease of native coronary artery without angina pectoris: Secondary | ICD-10-CM | POA: Diagnosis not present

## 2019-10-27 DIAGNOSIS — I350 Nonrheumatic aortic (valve) stenosis: Secondary | ICD-10-CM | POA: Diagnosis not present

## 2019-10-27 DIAGNOSIS — I509 Heart failure, unspecified: Secondary | ICD-10-CM | POA: Diagnosis not present

## 2019-10-30 DIAGNOSIS — T84018D Broken internal joint prosthesis, other site, subsequent encounter: Secondary | ICD-10-CM | POA: Diagnosis not present

## 2019-10-30 DIAGNOSIS — I509 Heart failure, unspecified: Secondary | ICD-10-CM | POA: Diagnosis not present

## 2019-10-30 DIAGNOSIS — I350 Nonrheumatic aortic (valve) stenosis: Secondary | ICD-10-CM | POA: Diagnosis not present

## 2019-10-30 DIAGNOSIS — E1122 Type 2 diabetes mellitus with diabetic chronic kidney disease: Secondary | ICD-10-CM | POA: Diagnosis not present

## 2019-10-30 DIAGNOSIS — I2510 Atherosclerotic heart disease of native coronary artery without angina pectoris: Secondary | ICD-10-CM | POA: Diagnosis not present

## 2019-10-30 DIAGNOSIS — N183 Chronic kidney disease, stage 3 unspecified: Secondary | ICD-10-CM | POA: Diagnosis not present

## 2019-10-30 DIAGNOSIS — J449 Chronic obstructive pulmonary disease, unspecified: Secondary | ICD-10-CM | POA: Diagnosis not present

## 2019-10-30 DIAGNOSIS — E1142 Type 2 diabetes mellitus with diabetic polyneuropathy: Secondary | ICD-10-CM | POA: Diagnosis not present

## 2019-10-30 DIAGNOSIS — I130 Hypertensive heart and chronic kidney disease with heart failure and stage 1 through stage 4 chronic kidney disease, or unspecified chronic kidney disease: Secondary | ICD-10-CM | POA: Diagnosis not present

## 2019-11-01 DIAGNOSIS — I2510 Atherosclerotic heart disease of native coronary artery without angina pectoris: Secondary | ICD-10-CM | POA: Diagnosis not present

## 2019-11-01 DIAGNOSIS — J449 Chronic obstructive pulmonary disease, unspecified: Secondary | ICD-10-CM | POA: Diagnosis not present

## 2019-11-01 DIAGNOSIS — T84018D Broken internal joint prosthesis, other site, subsequent encounter: Secondary | ICD-10-CM | POA: Diagnosis not present

## 2019-11-01 DIAGNOSIS — I350 Nonrheumatic aortic (valve) stenosis: Secondary | ICD-10-CM | POA: Diagnosis not present

## 2019-11-01 DIAGNOSIS — E1122 Type 2 diabetes mellitus with diabetic chronic kidney disease: Secondary | ICD-10-CM | POA: Diagnosis not present

## 2019-11-01 DIAGNOSIS — E1142 Type 2 diabetes mellitus with diabetic polyneuropathy: Secondary | ICD-10-CM | POA: Diagnosis not present

## 2019-11-01 DIAGNOSIS — N183 Chronic kidney disease, stage 3 unspecified: Secondary | ICD-10-CM | POA: Diagnosis not present

## 2019-11-01 DIAGNOSIS — I509 Heart failure, unspecified: Secondary | ICD-10-CM | POA: Diagnosis not present

## 2019-11-01 DIAGNOSIS — I130 Hypertensive heart and chronic kidney disease with heart failure and stage 1 through stage 4 chronic kidney disease, or unspecified chronic kidney disease: Secondary | ICD-10-CM | POA: Diagnosis not present

## 2019-11-06 DIAGNOSIS — M810 Age-related osteoporosis without current pathological fracture: Secondary | ICD-10-CM | POA: Diagnosis not present

## 2019-11-06 DIAGNOSIS — Z8781 Personal history of (healed) traumatic fracture: Secondary | ICD-10-CM | POA: Diagnosis not present

## 2019-11-06 DIAGNOSIS — M65331 Trigger finger, right middle finger: Secondary | ICD-10-CM | POA: Diagnosis not present

## 2019-11-06 DIAGNOSIS — M0579 Rheumatoid arthritis with rheumatoid factor of multiple sites without organ or systems involvement: Secondary | ICD-10-CM | POA: Diagnosis not present

## 2019-11-06 DIAGNOSIS — N1832 Chronic kidney disease, stage 3b: Secondary | ICD-10-CM | POA: Diagnosis not present

## 2019-11-06 DIAGNOSIS — Z79899 Other long term (current) drug therapy: Secondary | ICD-10-CM | POA: Diagnosis not present

## 2019-11-08 DIAGNOSIS — I350 Nonrheumatic aortic (valve) stenosis: Secondary | ICD-10-CM | POA: Diagnosis not present

## 2019-11-08 DIAGNOSIS — N183 Chronic kidney disease, stage 3 unspecified: Secondary | ICD-10-CM | POA: Diagnosis not present

## 2019-11-08 DIAGNOSIS — J449 Chronic obstructive pulmonary disease, unspecified: Secondary | ICD-10-CM | POA: Diagnosis not present

## 2019-11-08 DIAGNOSIS — E1122 Type 2 diabetes mellitus with diabetic chronic kidney disease: Secondary | ICD-10-CM | POA: Diagnosis not present

## 2019-11-08 DIAGNOSIS — E1142 Type 2 diabetes mellitus with diabetic polyneuropathy: Secondary | ICD-10-CM | POA: Diagnosis not present

## 2019-11-08 DIAGNOSIS — I2510 Atherosclerotic heart disease of native coronary artery without angina pectoris: Secondary | ICD-10-CM | POA: Diagnosis not present

## 2019-11-08 DIAGNOSIS — T84018D Broken internal joint prosthesis, other site, subsequent encounter: Secondary | ICD-10-CM | POA: Diagnosis not present

## 2019-11-08 DIAGNOSIS — I509 Heart failure, unspecified: Secondary | ICD-10-CM | POA: Diagnosis not present

## 2019-11-08 DIAGNOSIS — I130 Hypertensive heart and chronic kidney disease with heart failure and stage 1 through stage 4 chronic kidney disease, or unspecified chronic kidney disease: Secondary | ICD-10-CM | POA: Diagnosis not present

## 2019-11-09 DIAGNOSIS — E1122 Type 2 diabetes mellitus with diabetic chronic kidney disease: Secondary | ICD-10-CM | POA: Diagnosis not present

## 2019-11-09 DIAGNOSIS — I2510 Atherosclerotic heart disease of native coronary artery without angina pectoris: Secondary | ICD-10-CM | POA: Diagnosis not present

## 2019-11-09 DIAGNOSIS — I130 Hypertensive heart and chronic kidney disease with heart failure and stage 1 through stage 4 chronic kidney disease, or unspecified chronic kidney disease: Secondary | ICD-10-CM | POA: Diagnosis not present

## 2019-11-09 DIAGNOSIS — J449 Chronic obstructive pulmonary disease, unspecified: Secondary | ICD-10-CM | POA: Diagnosis not present

## 2019-11-09 DIAGNOSIS — I350 Nonrheumatic aortic (valve) stenosis: Secondary | ICD-10-CM | POA: Diagnosis not present

## 2019-11-09 DIAGNOSIS — I509 Heart failure, unspecified: Secondary | ICD-10-CM | POA: Diagnosis not present

## 2019-11-09 DIAGNOSIS — N183 Chronic kidney disease, stage 3 unspecified: Secondary | ICD-10-CM | POA: Diagnosis not present

## 2019-11-09 DIAGNOSIS — E1142 Type 2 diabetes mellitus with diabetic polyneuropathy: Secondary | ICD-10-CM | POA: Diagnosis not present

## 2019-11-09 DIAGNOSIS — T84018D Broken internal joint prosthesis, other site, subsequent encounter: Secondary | ICD-10-CM | POA: Diagnosis not present

## 2019-11-13 DIAGNOSIS — H353122 Nonexudative age-related macular degeneration, left eye, intermediate dry stage: Secondary | ICD-10-CM | POA: Diagnosis not present

## 2019-11-13 DIAGNOSIS — D3131 Benign neoplasm of right choroid: Secondary | ICD-10-CM | POA: Diagnosis not present

## 2019-11-13 DIAGNOSIS — H43813 Vitreous degeneration, bilateral: Secondary | ICD-10-CM | POA: Diagnosis not present

## 2019-11-13 DIAGNOSIS — H353211 Exudative age-related macular degeneration, right eye, with active choroidal neovascularization: Secondary | ICD-10-CM | POA: Diagnosis not present

## 2019-11-14 DIAGNOSIS — I350 Nonrheumatic aortic (valve) stenosis: Secondary | ICD-10-CM | POA: Diagnosis not present

## 2019-11-14 DIAGNOSIS — J449 Chronic obstructive pulmonary disease, unspecified: Secondary | ICD-10-CM | POA: Diagnosis not present

## 2019-11-14 DIAGNOSIS — I2510 Atherosclerotic heart disease of native coronary artery without angina pectoris: Secondary | ICD-10-CM | POA: Diagnosis not present

## 2019-11-14 DIAGNOSIS — T84018D Broken internal joint prosthesis, other site, subsequent encounter: Secondary | ICD-10-CM | POA: Diagnosis not present

## 2019-11-14 DIAGNOSIS — I130 Hypertensive heart and chronic kidney disease with heart failure and stage 1 through stage 4 chronic kidney disease, or unspecified chronic kidney disease: Secondary | ICD-10-CM | POA: Diagnosis not present

## 2019-11-14 DIAGNOSIS — N183 Chronic kidney disease, stage 3 unspecified: Secondary | ICD-10-CM | POA: Diagnosis not present

## 2019-11-14 DIAGNOSIS — E1142 Type 2 diabetes mellitus with diabetic polyneuropathy: Secondary | ICD-10-CM | POA: Diagnosis not present

## 2019-11-14 DIAGNOSIS — E1122 Type 2 diabetes mellitus with diabetic chronic kidney disease: Secondary | ICD-10-CM | POA: Diagnosis not present

## 2019-11-14 DIAGNOSIS — I509 Heart failure, unspecified: Secondary | ICD-10-CM | POA: Diagnosis not present

## 2019-11-16 DIAGNOSIS — I509 Heart failure, unspecified: Secondary | ICD-10-CM | POA: Diagnosis not present

## 2019-11-16 DIAGNOSIS — I130 Hypertensive heart and chronic kidney disease with heart failure and stage 1 through stage 4 chronic kidney disease, or unspecified chronic kidney disease: Secondary | ICD-10-CM | POA: Diagnosis not present

## 2019-11-16 DIAGNOSIS — E1122 Type 2 diabetes mellitus with diabetic chronic kidney disease: Secondary | ICD-10-CM | POA: Diagnosis not present

## 2019-11-16 DIAGNOSIS — N183 Chronic kidney disease, stage 3 unspecified: Secondary | ICD-10-CM | POA: Diagnosis not present

## 2019-11-16 DIAGNOSIS — I2510 Atherosclerotic heart disease of native coronary artery without angina pectoris: Secondary | ICD-10-CM | POA: Diagnosis not present

## 2019-11-16 DIAGNOSIS — I350 Nonrheumatic aortic (valve) stenosis: Secondary | ICD-10-CM | POA: Diagnosis not present

## 2019-11-16 DIAGNOSIS — T84018D Broken internal joint prosthesis, other site, subsequent encounter: Secondary | ICD-10-CM | POA: Diagnosis not present

## 2019-11-16 DIAGNOSIS — E1142 Type 2 diabetes mellitus with diabetic polyneuropathy: Secondary | ICD-10-CM | POA: Diagnosis not present

## 2019-11-16 DIAGNOSIS — J449 Chronic obstructive pulmonary disease, unspecified: Secondary | ICD-10-CM | POA: Diagnosis not present

## 2019-11-17 DIAGNOSIS — D2271 Melanocytic nevi of right lower limb, including hip: Secondary | ICD-10-CM | POA: Diagnosis not present

## 2019-11-17 DIAGNOSIS — D225 Melanocytic nevi of trunk: Secondary | ICD-10-CM | POA: Diagnosis not present

## 2019-11-17 DIAGNOSIS — C44622 Squamous cell carcinoma of skin of right upper limb, including shoulder: Secondary | ICD-10-CM | POA: Diagnosis not present

## 2019-11-17 DIAGNOSIS — B079 Viral wart, unspecified: Secondary | ICD-10-CM | POA: Diagnosis not present

## 2019-11-17 DIAGNOSIS — D2272 Melanocytic nevi of left lower limb, including hip: Secondary | ICD-10-CM | POA: Diagnosis not present

## 2019-11-17 DIAGNOSIS — D2262 Melanocytic nevi of left upper limb, including shoulder: Secondary | ICD-10-CM | POA: Diagnosis not present

## 2019-11-17 DIAGNOSIS — D485 Neoplasm of uncertain behavior of skin: Secondary | ICD-10-CM | POA: Diagnosis not present

## 2019-11-17 DIAGNOSIS — C44329 Squamous cell carcinoma of skin of other parts of face: Secondary | ICD-10-CM | POA: Diagnosis not present

## 2019-11-17 DIAGNOSIS — L821 Other seborrheic keratosis: Secondary | ICD-10-CM | POA: Diagnosis not present

## 2019-11-17 DIAGNOSIS — L570 Actinic keratosis: Secondary | ICD-10-CM | POA: Diagnosis not present

## 2019-11-17 DIAGNOSIS — L728 Other follicular cysts of the skin and subcutaneous tissue: Secondary | ICD-10-CM | POA: Diagnosis not present

## 2019-11-17 DIAGNOSIS — D2261 Melanocytic nevi of right upper limb, including shoulder: Secondary | ICD-10-CM | POA: Diagnosis not present

## 2019-11-20 NOTE — Progress Notes (Signed)
11/21/2019 1:42 PM   Kerri Carter Kerri Carter 09/12/1940 [id number]  Referring provider: McLean-Scocuzza, Nino Glow, MD [place],  [place] [address] Chief Complaint  Patient presents with  . Follow-up    HPI: Kerri Carter is a 79 y.o. female who returns for a 1 year follow up of left renal mass, urinary frequency, urge incontinence and CKD stage 3.   CT of the abdomen pelvis from 03/2016 revealed multiple hypodensities too small to characterize as well as a possible area of suspicious heterogeneity within the lower pole.  She is been followed with serial ultrasound since that time which have been unremarkable without evidence of lesions or tumors.  Renal ultrasound from 10/14/2018 was unremarkable, no masses lesions or tumors identified on her left kidney.  Her right kidney is surgically absent.  She has a stable left ovarian cyst.  CT pelvis w/o contrast from 02/21/2019 noted no signs of fracture. Cystic left adnexal lesion has enlarged since 2018 where it measured approximately 1.8 x 1.7 cm. This is not well assessed due to streak artifact as well as lack of contrast.   She has a history of urinary urgency and urge incontinence.  She previously tried and failed Myrbetriq 25 mg.  At 2019 appointment, this was increased to 50 mg. Last time, she reported that she now had time to get to the bathroom especially first in the morning.  She took the medication in the morning that wass effective all day.  She had decreased urinary urgency frequency.  She had no side effects from this medication. She wanted to continue the medication. No dysuria or gross hematuria.  No flank pain.  No weight loss.  Creatinine was 1.5 on 10/26/2019.  Bladder scan by PVR is 197 mL, it has been several hours since patient urinated. Her urinary symptoms are stable on Myrebtriq 50 mg. No hematuria or dysuria. Denies infection.   She reports having some issues with her breathing and lungs. She is followed by  pulmonology.   Her husband passed in 02/2019 from COVID-19.    PMH: Past Medical History:  Diagnosis Date  . Anxiety   . Asthma   . Bipolar disorder ([facility])   . Cataract    s/p b/l repair   . Chicken pox   . CKD (chronic kidney disease)   . CKD (chronic kidney disease), stage III ([place])    a. s/p R nephrectomy.  . Conversion disorder   . COPD (chronic obstructive pulmonary disease) ([place])   . Depression   . Essential hypertension   . GERD (gastroesophageal reflux disease)   . Hyperlipidemia   . Inflammatory arthritis    a. hands/carpal tunnel.  b. Low titer rheumatoid factor. c. Negative anti-CCP antibodies. d. Plaquenil.  . Non-Obstructive CAD    a. 07/2009 Cath ([facility]): nonobs dzs;  b. 03/2011 Cath [facility]): nonobs dzs.  . Osteoarthritis    a. Knees.  . PUD (peptic ulcer disease)   . S/P right hip fracture    11/01/16 s/p repair  . Shoulder pain   . Sleep apnea    no cpap  . Spinal stenosis at L4-L5 level    severe with L4/L5 anterolisthesis grade 1 anterolisthesis   . Toxic maculopathy   . Valvular heart disease    a. 07/2015 Echo: EF 55-60%, Mild AI, AS, MR, and TR.    Surgical History: Past Surgical History:  Procedure Laterality Date  . APPENDECTOMY    . BACK SURGERY    . BUNIONECTOMY  Right   . CATARACT EXTRACTION, BILATERAL    . CESAREAN SECTION     x1  . CHOLECYSTECTOMY N/A 05/11/2016   Procedure: LAPAROSCOPIC CHOLECYSTECTOMY;  Surgeon: Florene Glen, MD;  Location: [facility] ORS;  Service: General;  Laterality: N/A;  . COLONOSCOPY WITH PROPOFOL N/A 04/02/2016   Procedure: COLONOSCOPY WITH PROPOFOL;  Surgeon: Jonathon Bellows, MD;  Location: [facility] ENDOSCOPY;  Service: Endoscopy;  Laterality: N/A;  . ENDOSCOPIC RETROGRADE CHOLANGIOPANCREATOGRAPHY (ERCP) WITH PROPOFOL N/A 05/08/2016   Procedure: ENDOSCOPIC RETROGRADE CHOLANGIOPANCREATOGRAPHY (ERCP) WITH PROPOFOL;  Surgeon: Lucilla Lame, MD;  Location: [facility] ENDOSCOPY;  Service: Endoscopy;  Laterality: N/A;  . ERCP     with biliary  spincterotomy 05/08/16 Dr. Allen Norris for choledocholithiasis   . ESOPHAGEAL DILATION  04/02/2016   Procedure: ESOPHAGEAL DILATION;  Surgeon: Jonathon Bellows, MD;  Location: [facility] ENDOSCOPY;  Service: Endoscopy;;  . ESOPHAGOGASTRODUODENOSCOPY (EGD) WITH PROPOFOL N/A 04/02/2016   Procedure: ESOPHAGOGASTRODUODENOSCOPY (EGD) WITH PROPOFOL;  Surgeon: Jonathon Bellows, MD;  Location: [facility] ENDOSCOPY;  Service: Endoscopy;  Laterality: N/A;  . HIP ARTHROPLASTY Right 11/01/2016   Procedure: ARTHROPLASTY BIPOLAR HIP (HEMIARTHROPLASTY);  Surgeon: Corky Mull, MD;  Location: [facility] ORS;  Service: Orthopedics;  Laterality: Right;  . 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 osteoporosis     noted DEXA 08/19/16   . REPLACEMENT TOTAL KNEE Right   . REVERSE SHOULDER ARTHROPLASTY Right 11/04/2017   Procedure: REVERSE SHOULDER ARTHROPLASTY;  Surgeon: Corky Mull, MD;  Location: [facility] ORS;  Service: Orthopedics;  Laterality: Right;  . REVERSE SHOULDER ARTHROPLASTY Left 07/26/2018   Procedure: REVERSE SHOULDER ARTHROPLASTY;  Surgeon: Corky Mull, MD;  Location: [facility] ORS;  Service: Orthopedics;  Laterality: Left;  . TONSILLECTOMY    . TOTAL HIP ARTHROPLASTY  12/10/11   [facility] left hip  . TOTAL HIP ARTHROPLASTY Bilateral   . TUBAL LIGATION      Home Medications:  Allergies as of 11/21/2019      Reactions   Ceftin [cefuroxime Axetil] Anaphylaxis   Lisinopril Anaphylaxis   Morphine Other (See Comments)   Per patient, low blood pressure issues that requires action to raise it back up. Can take small infrequent doses   Sulfasalazine Anaphylaxis   Aspirin Other (See Comments)   Sulfasalazine allergy cross reacts   Antihistamines, Chlorpheniramine-type Other (See Comments)   Makes pt hyper   Antivert [meclizine Hcl] Other (See Comments)   Bladder will not empty   Decongestant [pseudoephedrine Hcl] Other (See Comments)   Makes pt hyper   Doxycycline Other (See Comments)   GI upset    Polymyxin B Other (See Comments)   Medication was in eye drops.   Sulfa Antibiotics Other (See Comments)   Face swelling   Xarelto [rivaroxaban] Other (See Comments)   Stomach burning, bleeding, and tar in stool   Adhesive [tape] Rash   Iodine Hives, Rash   Per patient allergy is to contrast dye only, she is able to use betadine scrubs.   Levaquin [levofloxacin In D5w] Rash   Tetanus Toxoids Rash, Other (See Comments)   Fever and hot to touch at injection site      Medication List       Accurate as of November 21, 2019  1:42 PM. If you have any questions, ask your nurse or doctor.        albuterol 108 (90 Base) MCG/ACT inhaler Commonly known as: Ventolin HFA Inhale 2 puffs into the lungs every 6 (six) hours  as needed.   ALPRAZolam 0.25 MG tablet Commonly known as: Xanax Take 1 tablet (0.25 mg total) by mouth at bedtime as needed for anxiety. Started by: Davina Poke, RN   amLODipine 2.5 MG tablet Commonly known as: NORVASC Take 2.5 mg by mouth daily.   benzonatate 200 MG capsule Commonly known as: TESSALON Take 1 capsule (200 mg total) by mouth 3 (three) times daily as needed for cough.   budesonide 0.25 MG/2ML nebulizer solution Commonly known as: PULMICORT Take 2 mLs (0.25 mg total) by nebulization 2 (two) times daily.   Bystolic 10 MG tablet Generic drug: nebivolol TAKE 1 TABLET BY MOUTH EVERY DAY   Cholecalciferol 50 MCG (2000 UT) Caps Take by mouth.   dicyclomine 10 MG capsule Commonly known as: BENTYL TAKE 1 CAPSULE (10 MG TOTAL) BY MOUTH 4 (FOUR) TIMES DAILY - BEFORE MEALS AND AT BEDTIME. What changed:   how much to take  when to take this   escitalopram 10 MG tablet Commonly known as: LEXAPRO Take 1 tablet (10 mg total) by mouth daily.   famotidine 20 MG tablet Commonly known as: Pepcid Take 1 tablet (20 mg total) by mouth daily. Before breakfast or dinner   fluticasone 50 MCG/ACT nasal spray Commonly known as: Flonase Place 1 spray into  both nostrils 2 (two) times daily.   gabapentin 300 MG capsule Commonly known as: NEURONTIN Take 2-3 capsules (600-900 mg total) by mouth See admin instructions. Take 600 mg by mouth in the morning and 900 mg at bedtime What changed: additional instructions   Humira Pen 40 MG/0.4ML Pnkt Generic drug: Adalimumab Inject 40 mg into the skin every 14 (fourteen) days.   ipratropium-albuterol 0.5-2.5 (3) MG/3ML Soln Commonly known as: DUONEB Take 3 mLs by nebulization 3 (three) times daily as needed.   lamoTRIgine 100 MG tablet Commonly known as: LAMICTAL Take 1 tablet (100 mg total) by mouth 2 (two) times daily.   leflunomide 20 MG tablet Commonly known as: ARAVA Take 1 tablet (20 mg total) by mouth daily.   lovastatin 20 MG tablet Commonly known as: MEVACOR Take 1 tablet (20 mg total) by mouth at bedtime.   montelukast 10 MG tablet Commonly known as: SINGULAIR Take 1 tablet (10 mg total) by mouth daily.   multivitamin-lutein Caps capsule Take 1 capsule by mouth daily.   Myrbetriq 50 MG Tb24 tablet Generic drug: mirabegron ER TAKE 1 TABLET BY MOUTH EVERYDAY AT BEDTIME What changed: See the new instructions.   pantoprazole 40 MG tablet Commonly known as: PROTONIX Take 1 tablet (40 mg total) by mouth 2 (two) times daily. 30 minutes before food. Note reduction in frequency   predniSONE 10 MG (21) Tbpk tablet Commonly known as: STERAPRED UNI-PAK 21 TAB Take as directed in the package, this is a taper pack. What changed: Another medication with the same name was added. Make sure you understand how and when to take each. Changed by: Vernard Gambles, MD   predniSONE 10 MG tablet Commonly known as: DELTASONE 2 tablets daily for 4 days then 1 tablet daily until seen again in follow-up What changed: You were already taking a medication with the same name, and this prescription was added. Make sure you understand how and when to take each. Changed by: Vernard Gambles, MD    QUEtiapine 25 MG tablet Commonly known as: SEROQUEL Take 1 tablet (25 mg total) by mouth at bedtime.   Spiriva Respimat 2.5 MCG/ACT Aers Generic drug: Tiotropium Bromide Monohydrate TAKE 1 PUFF BY  MOUTH EVERY DAY   sucralfate 1 g tablet Commonly known as: CARAFATE Take 1 tablet (1 g total) by mouth 4 (four) times daily. TAKE 1 TABLET BY MOUTH 4 TIMES A DAY WITH MEALS AND AT BEDTIME What changed: when to take this       Allergies:  Allergies  Allergen Reactions  . Ceftin [Cefuroxime Axetil] Anaphylaxis  . Lisinopril Anaphylaxis  . Morphine Other (See Comments)    Per patient, low blood pressure issues that requires action to raise it back up. Can take small infrequent doses  . Sulfasalazine Anaphylaxis  . Aspirin Other (See Comments)    Sulfasalazine allergy cross reacts  . Antihistamines, Chlorpheniramine-Type Other (See Comments)    Makes pt hyper  . Antivert [Meclizine Hcl] Other (See Comments)    Bladder will not empty  . Decongestant [Pseudoephedrine Hcl] Other (See Comments)    Makes pt hyper  . Doxycycline Other (See Comments)    GI upset  . Polymyxin B Other (See Comments)    Medication was in eye drops.  . Sulfa Antibiotics Other (See Comments)    Face swelling  . Xarelto [Rivaroxaban] Other (See Comments)    Stomach burning, bleeding, and tar in stool  . Adhesive [Tape] Rash  . Iodine Hives and Rash    Per patient allergy is to contrast dye only, she is able to use betadine scrubs.  [place] [Levofloxacin In D5w] Rash  . Tetanus Toxoids Rash and Other (See Comments)    Fever and hot to touch at injection site    Family History: Family History  Problem Relation Age of Onset  . Rheum arthritis Mother   . Asthma Mother   . Parkinson's disease Mother   . Heart disease Mother   . Stroke Mother   . Hypertension Mother   . Heart attack Father   . Heart disease Father   . Hypertension Father   . Peripheral Artery Disease Father   . Diabetes Son   .  Gout Son   . Asthma Sister   . Heart disease Sister   . Lung cancer Sister   . Heart disease Sister   . Heart disease Sister   . Breast cancer Sister   . Heart attack Sister   . Heart disease Brother   . Heart disease Maternal Grandmother   . Diabetes Maternal Grandmother   . Colon cancer Maternal Grandmother   . Cancer Maternal Grandmother        Hodgkins lymphoma  . Heart disease Brother   . Alcohol abuse Brother   . Depression Brother   . Dementia Son     Social History:  reports that she quit smoking about 45 years ago. Her smoking use included cigarettes. She has a 10.00 pack-year smoking history. She has never used smokeless tobacco. She reports that she does not drink alcohol and does not use drugs.   Physical Exam: BP 130/80   Pulse 70   Constitutional:  Alert and oriented, No acute distress. HEENT: [place] AT, moist mucus membranes.  Trachea midline, no masses. Cardiovascular: No clubbing, cyanosis, or edema. Respiratory: Normal respiratory effort, no increased work of breathing. Skin: No rashes, bruises or suspicious lesions. Neurologic: Grossly intact, no focal deficits, moving all 4 extremities. Psychiatric: Normal mood and affect.  Laboratory Data:  Lab Results  Component Value Date   CREATININE 1.48 (H) 02/21/2019    Pertinent Imaging: Results for orders placed or performed in visit on 11/21/19  BLADDER SCAN AMB NON-IMAGING  Result Value Ref Range   Scan Result 113ml     Assessment & Plan:    1. Urinary urgency  Stable non bothersome urinary symptoms PVR 197 mL, patient has not urinated in several hours.  Continue Mrybetriq 50 mg.   2. Possible renal mass Not seen on any subsequent imaging, likely artifactually No further work up needed  F/u 1 year  [place] [address] [place],  [address] [phone]  I, Selena Batten, am acting as a scribe for Dr. Hollice Espy.  I have reviewed the  above documentation for accuracy and completeness, and I agree with the above.   Hollice Espy, MD

## 2019-11-21 ENCOUNTER — Other Ambulatory Visit: Payer: Self-pay

## 2019-11-21 ENCOUNTER — Ambulatory Visit: Payer: Medicare PPO | Admitting: Pulmonary Disease

## 2019-11-21 ENCOUNTER — Telehealth (HOSPITAL_COMMUNITY): Payer: Self-pay | Admitting: *Deleted

## 2019-11-21 ENCOUNTER — Encounter: Payer: Self-pay | Admitting: Pulmonary Disease

## 2019-11-21 ENCOUNTER — Ambulatory Visit: Payer: Medicare PPO | Admitting: Urology

## 2019-11-21 VITALS — BP 114/66 | HR 73 | Temp 97.1°F | Ht 61.5 in | Wt 161.0 lb

## 2019-11-21 VITALS — BP 130/80 | HR 70

## 2019-11-21 DIAGNOSIS — N3941 Urge incontinence: Secondary | ICD-10-CM

## 2019-11-21 DIAGNOSIS — R053 Chronic cough: Secondary | ICD-10-CM | POA: Diagnosis not present

## 2019-11-21 DIAGNOSIS — J449 Chronic obstructive pulmonary disease, unspecified: Secondary | ICD-10-CM | POA: Diagnosis not present

## 2019-11-21 DIAGNOSIS — J849 Interstitial pulmonary disease, unspecified: Secondary | ICD-10-CM

## 2019-11-21 DIAGNOSIS — Z8616 Personal history of COVID-19: Secondary | ICD-10-CM | POA: Diagnosis not present

## 2019-11-21 DIAGNOSIS — G4734 Idiopathic sleep related nonobstructive alveolar hypoventilation: Secondary | ICD-10-CM

## 2019-11-21 LAB — BLADDER SCAN AMB NON-IMAGING

## 2019-11-21 MED ORDER — PREDNISONE 10 MG PO TABS: ORAL_TABLET | ORAL | 1 refills | Status: DC

## 2019-11-21 MED ORDER — ALPRAZOLAM 0.25 MG PO TABS
0.2500 mg | ORAL_TABLET | Freq: Every evening | ORAL | 0 refills | Status: DC | PRN
Start: 2019-11-21 — End: 2019-12-06

## 2019-11-21 NOTE — Progress Notes (Signed)
Subjective:    Patient ID: Kerri Carter, female    DOB: 06/24/1940, 79 y.o.   MRN: [id number]  HPI Kerri Carter presents as a follow-up from her visit of 18 October 2019.  Recall that she was seen for increasing cough at that time.  She has issues with asthma/COPD overlap syndrome and mild interstitial lung disease related to rheumatoid arthritis.  During her visit in September she was given a prescription of Augmentin and prednisone taper.  She states that while she was on the prednisone taper she did very well but once the taper ran out the cough became worse again.  She has been using her DuoNeb and Pulmicort nebulizers as previously instructed (she had not been doing so on her September visit).  Overall she feels better but it is debilitated by her cough.  Chest x-ray performed on her visit in September was without new acute change.  She does note also issues with nocturnal awakenings.  She used to have supplemental oxygen but has not been using this.  She had been diagnosed with obstructive sleep apnea in the past however was not able to tolerate the CPAP because it "smothered her".  She has an oxygen concentrator at home that she does not use.  She voices no other complaint.   Review of Systems A 10 point review of systems was performed and it is as noted above otherwise negative.  Allergies  Allergen Reactions  . Ceftin [Cefuroxime Axetil] Anaphylaxis  . Lisinopril Anaphylaxis  . Morphine Other (See Comments)    Per patient, low blood pressure issues that requires action to raise it back up. Can take small infrequent doses  . Sulfasalazine Anaphylaxis  . Aspirin Other (See Comments)    Sulfasalazine allergy cross reacts  . Antihistamines, Chlorpheniramine-Type Other (See Comments)    Makes pt hyper  . Antivert [Meclizine Hcl] Other (See Comments)    Bladder will not empty  . Decongestant [Pseudoephedrine Hcl] Other (See Comments)    Makes pt hyper  . Doxycycline Other (See Comments)      GI upset  . Polymyxin B Other (See Comments)    Medication was in eye drops.  . Sulfa Antibiotics Other (See Comments)    Face swelling  . Xarelto [Rivaroxaban] Other (See Comments)    Stomach burning, bleeding, and tar in stool  . Adhesive [Tape] Rash  . Iodine Hives and Rash    Per patient allergy is to contrast dye only, she is able to use betadine scrubs.  Kerri Carter [Levofloxacin In D5w] Rash  . Tetanus Toxoids Rash and Other (See Comments)    Fever and hot to touch at injection site   Current Meds  Medication Sig  . Adalimumab (HUMIRA PEN) 40 MG/0.4ML PNKT Inject 40 mg into the skin every 14 (fourteen) days.   Marland Kitchen albuterol (VENTOLIN HFA) 108 (90 Base) MCG/ACT inhaler Inhale 2 puffs into the lungs every 6 (six) hours as needed.  . ALPRAZolam (XANAX) 0.25 MG tablet Take 1 tablet (0.25 mg total) by mouth at bedtime as needed for anxiety.  Marland Kitchen amLODipine (NORVASC) 2.5 MG tablet Take 2.5 mg by mouth daily.  . benzonatate (TESSALON) 200 MG capsule Take 1 capsule (200 mg total) by mouth 3 (three) times daily as needed for cough.  . budesonide (PULMICORT) 0.25 MG/2ML nebulizer solution Take 2 mLs (0.25 mg total) by nebulization 2 (two) times daily.  Marland Kitchen BYSTOLIC 10 MG tablet TAKE 1 TABLET BY MOUTH EVERY DAY  . Cholecalciferol 50 MCG (  2000 UT) CAPS Take by mouth.  . dicyclomine (BENTYL) 10 MG capsule TAKE 1 CAPSULE (10 MG TOTAL) BY MOUTH 4 (FOUR) TIMES DAILY - BEFORE MEALS AND AT BEDTIME. (Patient taking differently: Take 20 mg by mouth in the morning and at bedtime. )  . escitalopram (LEXAPRO) 10 MG tablet Take 1 tablet (10 mg total) by mouth daily.  . famotidine (PEPCID) 20 MG tablet Take 1 tablet (20 mg total) by mouth daily. Before breakfast or dinner  . fluticasone (FLONASE) 50 MCG/ACT nasal spray Place 1 spray into both nostrils 2 (two) times daily.  Marland Kitchen gabapentin (NEURONTIN) 300 MG capsule Take 2-3 capsules (600-900 mg total) by mouth See admin instructions. Take 600 mg by mouth in the  morning and 900 mg at bedtime (Patient taking differently: Take 600-900 mg by mouth See admin instructions. Take 600 mg by mouth in the morning and bedtime)  . ipratropium-albuterol (DUONEB) 0.5-2.5 (3) MG/3ML SOLN Take 3 mLs by nebulization 3 (three) times daily as needed.  . lamoTRIgine (LAMICTAL) 100 MG tablet Take 1 tablet (100 mg total) by mouth 2 (two) times daily.  Marland Kitchen leflunomide (ARAVA) 20 MG tablet Take 1 tablet (20 mg total) by mouth daily.  Marland Kitchen lovastatin (MEVACOR) 20 MG tablet Take 1 tablet (20 mg total) by mouth at bedtime.  . montelukast (SINGULAIR) 10 MG tablet Take 1 tablet (10 mg total) by mouth daily.  . multivitamin-lutein (OCUVITE-LUTEIN) CAPS capsule Take 1 capsule by mouth daily.   Marland Kitchen MYRBETRIQ 50 MG TB24 tablet TAKE 1 TABLET BY MOUTH EVERYDAY AT BEDTIME (Patient taking differently: 50 mg daily. )  . pantoprazole (PROTONIX) 40 MG tablet Take 1 tablet (40 mg total) by mouth 2 (two) times daily. 30 minutes before food. Note reduction in frequency  . predniSONE (STERAPRED UNI-PAK 21 TAB) 10 MG (21) TBPK tablet Take as directed in the package, this is a taper pack.  Marland Kitchen QUEtiapine (SEROQUEL) 25 MG tablet Take 1 tablet (25 mg total) by mouth at bedtime.   Immunization History  Administered Date(s) Administered  . Fluad Quad(high Dose 65+) 10/03/2018, 10/16/2019  . Influenza Split 10/04/2012, 10/25/2013, 10/28/2016  . Influenza Whole 11/03/2011  . Influenza, High Dose Seasonal PF 10/04/2015, 11/03/2016  . Influenza,inj,Quad PF,6+ Mos 10/31/2014  . Influenza-Unspecified 01/02/2010, 11/24/2011, 10/21/2013, 10/31/2014, 09/28/2017  . Pneumococcal Conjugate-13 10/21/2013  . Pneumococcal Polysaccharide-23 11/07/2015   Patient has had COVID-19 illness December 2020 currently reluctant to take COVID-19 vaccine.     Objective:   Physical Exam  BP 114/66 (BP Location: Left Arm, Patient Position: Sitting, Cuff Size: Normal)   Pulse 73   Temp (!) 97.1 F (36.2 C) (Temporal)   Ht 5' 1.5"  (1.562 m)   Wt 161 lb (73 kg)   SpO2 96%   BMI 29.93 kg/m  GENERAL: Well-developed, well-nourished elderly woman, she is fully ambulatory today. Not ill appearing.  Not toxic appearing. HEAD: Normocephalic, atraumatic.  EYES: Pupils equal, round, reactive to light. No scleral icterus.  MOUTH: Nose/mouth/throat not examined due to masking requirements for COVID 19. NECK: Supple. No thyromegaly. Trachea midline. No JVD. No adenopathy. PULMONARY: Coarse breath sounds throughout, faint dry crackles at the bases more pronounced on the left base, occasional end expiratory wheeze. CARDIOVASCULAR: S1 and S2. Regular rate and rhythm.Grade 1/6 systolic ejection murmur left sternal border, unchanged. GASTROINTESTINAL: Benign MUSCULOSKELETAL: Mild changes of RA both hands, no clubbing, no edema. Limited range of motion of joints due to RA. NEUROLOGIC: No overt focal deficits. Awake, alert, speech is fluent.  SKIN: Intact,warm,dry. PSYCH: Mood and behavior appropriate  Chest x-ray performed 18 October 2019, independently reviewed: No acute new process.        Assessment & Plan:     ICD-10-CM   1. Asthma-COPD overlap syndrome (HCC)  J44.9    Better compensated Continue DuoNeb and Pulmicort nebulizers Modified steroid taper  2. Chronic cough  R05.3    Chronic issue due to interstitial lung disease and asthma COPD overlap Modified steroid taper Has not been taking any NAC  3. Interstitial pulmonary disease (HCC)  J84.9    Related to rheumatoid arthritis This issue adds complexity to her management  4. Personal history of COVID-19  Z86.18 January 2019 No significant sequela  5. Nocturnal hypoxemia  G47.34 Pulse oximetry, overnight   Has had issues with nocturnal hypoxemia previously Not wearing oxygen Reassess with ONOX   Orders Placed This Encounter  Procedures  . Pulse oximetry, overnight    On room air. DME: [employer].    Standing Status:   Future    Standing Expiration  Date:   11/20/2020   Meds ordered this encounter  Medications  . predniSONE (DELTASONE) 10 MG tablet    Sig: 2 tablets daily for 4 days then 1 tablet daily until seen again in follow-up    Dispense:  34 tablet    Refill:  1   Discussion:  Overall the patient is markedly better from her prior visit however still having some issues with cough.  Do recall however this is a chronic persistent issue for her and this has improved previously with use of nebulizers and inhaled corticosteroids.  The patient however had not been using these consistently.  In addition she has been recommended to take N-acetylcysteine 600 mg twice a day previously but had quit taking this.  We will proceed with giving her a modified steroid taper with prednisone 20 mg daily for 4 days and then 10 mg daily until seen again.  3 to 4 weeks time she is to contact us prior to that time should any new difficulties arise.  We will hold off on N-acetylcysteine supplementation until reassessment on follow-up visit.  Renold Don, MD [facility]   *This note was dictated using voice recognition software/Dragon.  Despite best efforts to proofread, errors can occur which can change the meaning.  Any change was purely unintentional.

## 2019-11-21 NOTE — Telephone Encounter (Signed)
Received a RX refill request for this patients Xanax. In reviewing the chart she has not been seen by a psychiatrist since early June and I dont see an upcoming appt. It is noted in the chart she would be referred to Dr Shea Evans because she didn't want to travel to [facility] as she lives in [place] and didn't have the transportation. Note indicates from June she got 21 Xanax which would have run out in early Sept. Will refer this concern to Dr Toy Care for further direction.

## 2019-11-21 NOTE — Telephone Encounter (Signed)
3 months Rx sent. Dr. Shea Evans has not started seeing pts in her office yet. Inform the pt that she will need to schedule a virtual appt with either myself or Dr. Shea Evans for future refills. Go ahead and have her scheduled with either one of [place].

## 2019-11-21 NOTE — Addendum Note (Signed)
Addended by: [place] Crane on: 11/21/2019 08:27 AM   Modules accepted: Orders

## 2019-11-21 NOTE — Patient Instructions (Signed)
We have sent in some prednisone for you you will take 2 tablets daily for 4 days then decrease to 1 tablet daily until you are seen again.  See you in follow-up in 3 to 4 weeks time with either me or the nurse practitioner.   We are going to get an overnight oximetry to evaluate your oxygen levels at nighttime.

## 2019-11-22 ENCOUNTER — Encounter: Payer: Self-pay | Admitting: Pulmonary Disease

## 2019-11-22 ENCOUNTER — Telehealth (HOSPITAL_COMMUNITY): Payer: Self-pay | Admitting: *Deleted

## 2019-11-22 NOTE — Telephone Encounter (Signed)
I replied to this yesterday- Copying and pasting- Dr. Shea Evans has not started seeing pts in her office yet. Inform the pt that she will need to schedule a virtual appt with either myself or Dr. Shea Evans for future refills. Go ahead and have her scheduled with either one of [place].

## 2019-11-22 NOTE — Telephone Encounter (Signed)
Dr Toy Care, this woman has not been seen since 6/10 and your note on that date said you would be referring her to Dr Shea Evans in [place] as the patient doesn't want to come here and difficult for her re her transportation. Shawn suggested you reach out to the Dr to make the transfer.

## 2019-11-23 ENCOUNTER — Telehealth (HOSPITAL_COMMUNITY): Payer: Self-pay | Admitting: *Deleted

## 2019-11-23 DIAGNOSIS — E1122 Type 2 diabetes mellitus with diabetic chronic kidney disease: Secondary | ICD-10-CM | POA: Diagnosis not present

## 2019-11-23 DIAGNOSIS — N183 Chronic kidney disease, stage 3 unspecified: Secondary | ICD-10-CM | POA: Diagnosis not present

## 2019-11-23 DIAGNOSIS — I130 Hypertensive heart and chronic kidney disease with heart failure and stage 1 through stage 4 chronic kidney disease, or unspecified chronic kidney disease: Secondary | ICD-10-CM | POA: Diagnosis not present

## 2019-11-23 DIAGNOSIS — E1142 Type 2 diabetes mellitus with diabetic polyneuropathy: Secondary | ICD-10-CM | POA: Diagnosis not present

## 2019-11-23 DIAGNOSIS — J449 Chronic obstructive pulmonary disease, unspecified: Secondary | ICD-10-CM | POA: Diagnosis not present

## 2019-11-23 DIAGNOSIS — I509 Heart failure, unspecified: Secondary | ICD-10-CM | POA: Diagnosis not present

## 2019-11-23 DIAGNOSIS — T84018D Broken internal joint prosthesis, other site, subsequent encounter: Secondary | ICD-10-CM | POA: Diagnosis not present

## 2019-11-23 DIAGNOSIS — I2510 Atherosclerotic heart disease of native coronary artery without angina pectoris: Secondary | ICD-10-CM | POA: Diagnosis not present

## 2019-11-23 DIAGNOSIS — I350 Nonrheumatic aortic (valve) stenosis: Secondary | ICD-10-CM | POA: Diagnosis not present

## 2019-11-23 NOTE — Telephone Encounter (Signed)
Reviewed record to call her and family and offer her a virtual appt with Dr Shea Evans in [place] as the record indicates she didn't want to travel to [facility] and she hasnt been since since early June when Dr Toy Care moved to [employer] to work.

## 2019-11-24 ENCOUNTER — Ambulatory Visit (INDEPENDENT_AMBULATORY_CARE_PROVIDER_SITE_OTHER): Payer: Medicare PPO

## 2019-11-24 ENCOUNTER — Telehealth (HOSPITAL_COMMUNITY): Payer: Self-pay | Admitting: *Deleted

## 2019-11-24 VITALS — Ht 61.5 in | Wt 161.0 lb

## 2019-11-24 DIAGNOSIS — Z0000 Encounter for general adult medical examination without abnormal findings: Secondary | ICD-10-CM | POA: Diagnosis not present

## 2019-11-24 NOTE — Patient Instructions (Addendum)
Kerri Carter , Thank you for taking time to come for your Medicare Wellness Visit. I appreciate your ongoing commitment to your health goals. Please review the following plan we discussed and let me know if I can assist you in the future.   These are the goals we discussed: Goals    . Follow up with Primary Care Provider     As needed       This is a list of the screening recommended for you and due dates:  Health Maintenance  Topic Date Due  . Hemoglobin A1C  06/01/2019  .  Hepatitis C: One time screening is recommended by Center for Disease Control  (CDC) for  adults born from [birth year] through 1963.   11/23/2020*  . Flu Shot  Completed  . DEXA scan (bone density measurement)  Completed  . Pneumonia vaccines  Completed  . Complete foot exam   Discontinued  . Eye exam for diabetics  Discontinued  . Urine Protein Check  Discontinued  . COVID-19 Vaccine  Discontinued  *Topic was postponed. The date shown is not the original due date.   Immunizations Immunization History  Administered Date(s) Administered  . Fluad Quad(high Dose 65+) 10/03/2018, 10/16/2019  . Influenza Split 10/04/2012, 10/25/2013, 10/28/2016  . Influenza Whole 11/03/2011  . Influenza, High Dose Seasonal PF 10/04/2015, 11/03/2016  . Influenza,inj,Quad PF,6+ Mos 10/31/2014  . Influenza-Unspecified 01/02/2010, 11/24/2011, 10/21/2013, 10/31/2014, 09/28/2017  . Pneumococcal Conjugate-13 10/21/2013  . Pneumococcal Polysaccharide-23 11/07/2015   Keep all routine maintenance appointments.   Next scheduled lab 12/01/19 @ 10:00  Follow up 12/08/19 @ 1:30  Advanced directives: End of life planning; Advance aging; Advanced directives discussed.  Copy of current HCPOA/Living Will requested.    Conditions/risks identified: none new  Follow up in one year for your annual wellness visit.   Preventive Care 26 Years and Older, Female Preventive care refers to lifestyle choices and visits with your health care provider that  can promote health and wellness. What does preventive care include?  A yearly physical exam. This is also called an annual well check.  Dental exams once or twice a year.  Routine eye exams. Ask your health care provider how often you should have your eyes checked.  Personal lifestyle choices, including:  Daily care of your teeth and gums.  Regular physical activity.  Eating a healthy diet.  Avoiding tobacco and drug use.  Limiting alcohol use.  Practicing safe sex.  Taking low-dose aspirin every day.  Taking vitamin and mineral supplements as recommended by your health care provider. What happens during an annual well check? The services and screenings done by your health care provider during your annual well check will depend on your age, overall health, lifestyle risk factors, and family history of disease. Counseling  Your health care provider may ask you questions about your:  Alcohol use.  Tobacco use.  Drug use.  Emotional well-being.  Home and relationship well-being.  Sexual activity.  Eating habits.  History of falls.  Memory and ability to understand (cognition).  Work and work Statistician.  Reproductive health. Screening  You may have the following tests or measurements:  Height, weight, and BMI.  Blood pressure.  Lipid and cholesterol levels. These may be checked every 5 years, or more frequently if you are over 37 years old.  Skin check.  Lung cancer screening. You may have this screening every year starting at age 48 if you have a 30-pack-year history of smoking and currently smoke or have  quit within the past 15 years.  Fecal occult blood test (FOBT) of the stool. You may have this test every year starting at age 27.  Flexible sigmoidoscopy or colonoscopy. You may have a sigmoidoscopy every 5 years or a colonoscopy every 10 years starting at age 48.  Hepatitis C blood test.  Hepatitis B blood test.  Sexually transmitted disease  (STD) testing.  Diabetes screening. This is done by checking your blood sugar (glucose) after you have not eaten for a while (fasting). You may have this done every 1-3 years.  Bone density scan. This is done to screen for osteoporosis. You may have this done starting at age 14.  Mammogram. This may be done every 1-2 years. Talk to your health care provider about how often you should have regular mammograms. Talk with your health care provider about your test results, treatment options, and if necessary, the need for more tests. Vaccines  Your health care provider may recommend certain vaccines, such as:  Influenza vaccine. This is recommended every year.  Tetanus, diphtheria, and acellular pertussis (Tdap, Td) vaccine. You may need a Td booster every 10 years.  Zoster vaccine. You may need this after age 50.  Pneumococcal 13-valent conjugate (PCV13) vaccine. One dose is recommended after age 34.  Pneumococcal polysaccharide (PPSV23) vaccine. One dose is recommended after age 52. Talk to your health care provider about which screenings and vaccines you need and how often you need them. This information is not intended to replace advice given to you by your health care provider. Make sure you discuss any questions you have with your health care provider. Document Released: 02/15/2015 Document Revised: 10/09/2015 Document Reviewed: 11/20/2014 Elsevier Interactive Patient Education  2017 [place] Prevention in the Home Falls can cause injuries. They can happen to people of all ages. There are many things you can do to make your home safe and to help prevent falls. What can I do on the outside of my home?  Regularly fix the edges of walkways and driveways and fix any cracks.  Remove anything that might make you trip as you walk through a door, such as a raised step or threshold.  Trim any bushes or trees on the path to your home.  Use bright outdoor lighting.  Clear any  walking paths of anything that might make someone trip, such as rocks or tools.  Regularly check to see if handrails are loose or broken. Make sure that both sides of any steps have handrails.  Any raised decks and porches should have guardrails on the edges.  Have any leaves, snow, or ice cleared regularly.  Use sand or salt on walking paths during winter.  Clean up any spills in your garage right away. This includes oil or grease spills. What can I do in the bathroom?  Use night lights.  Install grab bars by the toilet and in the tub and shower. Do not use towel bars as grab bars.  Use non-skid mats or decals in the tub or shower.  If you need to sit down in the shower, use a plastic, non-slip stool.  Keep the floor dry. Clean up any water that spills on the floor as soon as it happens.  Remove soap buildup in the tub or shower regularly.  Attach bath mats securely with double-sided non-slip rug tape.  Do not have throw rugs and other things on the floor that can make you trip. What can I do in the bedroom?  Use night lights.  Make sure that you have a light by your bed that is easy to reach.  Do not use any sheets or blankets that are too big for your bed. They should not hang down onto the floor.  Have a firm chair that has side arms. You can use this for support while you get dressed.  Do not have throw rugs and other things on the floor that can make you trip. What can I do in the kitchen?  Clean up any spills right away.  Avoid walking on wet floors.  Keep items that you use a lot in easy-to-reach places.  If you need to reach something above you, use a strong step stool that has a grab bar.  Keep electrical cords out of the way.  Do not use floor polish or wax that makes floors slippery. If you must use wax, use non-skid floor wax.  Do not have throw rugs and other things on the floor that can make you trip. What can I do with my stairs?  Do not leave  any items on the stairs.  Make sure that there are handrails on both sides of the stairs and use them. Fix handrails that are broken or loose. Make sure that handrails are as long as the stairways.  Check any carpeting to make sure that it is firmly attached to the stairs. Fix any carpet that is loose or worn.  Avoid having throw rugs at the top or bottom of the stairs. If you do have throw rugs, attach them to the floor with carpet tape.  Make sure that you have a light switch at the top of the stairs and the bottom of the stairs. If you do not have them, ask someone to add them for you. What else can I do to help prevent falls?  Wear shoes that:  Do not have high heels.  Have rubber bottoms.  Are comfortable and fit you well.  Are closed at the toe. Do not wear sandals.  If you use a stepladder:  Make sure that it is fully opened. Do not climb a closed stepladder.  Make sure that both sides of the stepladder are locked into place.  Ask someone to hold it for you, if possible.  Clearly mark and make sure that you can see:  Any grab bars or handrails.  First and last steps.  Where the edge of each step is.  Use tools that help you move around (mobility aids) if they are needed. These include:  Canes.  Walkers.  Scooters.  Crutches.  Turn on the lights when you go into a dark area. Replace any light bulbs as soon as they burn out.  Set up your furniture so you have a clear path. Avoid moving your furniture around.  If any of your floors are uneven, fix them.  If there are any pets around you, be aware of where they are.  Review your medicines with your doctor. Some medicines can make you feel dizzy. This can increase your chance of falling. Ask your doctor what other things that you can do to help prevent falls. This information is not intended to replace advice given to you by your health care provider. Make sure you discuss any questions you have with your  health care provider. Document Released: 11/15/2008 Document Revised: 06/27/2015 Document Reviewed: 02/23/2014 Elsevier Interactive Patient Education  2017 [employer].

## 2019-11-24 NOTE — Telephone Encounter (Signed)
Dr Shea Evans has agreed to take patient at the [facility] office provided I first speak to Ms Gitto re the possibility of her Xanax being reduced or discontinued. She said she was OK with that as long as whatever she was changed to worked or she wanted to go back to the Xanax. She wanted me to understand she doesn't misuse it and she is taking the smallest dose and says when she has tried to cut it it just turns to dust, the pill is small and there arent markings on it to cut it. Will reach out to [facility] in the [place] office for an appt and call patient back.

## 2019-11-24 NOTE — Progress Notes (Signed)
Subjective:   ROMAINE MACIOLEK is a 79 y.o. female who presents for Medicare Annual (Subsequent) preventive examination.  Review of Systems    No ROS.  Medicare Wellness Virtual Visit.       Objective:    Today's Vitals   11/24/19 1238  Weight: 161 lb (73 kg)  Height: 5' 1.5" (1.562 m)   Body mass index is 29.93 kg/m.  Advanced Directives 11/24/2019 02/21/2019 02/06/2019 01/10/2019 12/15/2018 11/23/2018 07/26/2018  Does Patient Have a Medical Advance Directive? Yes Yes Yes Yes Yes Yes Yes  Type of Paramedic of [place];Living will Healthcare Power of [place];Living will [place];Living will - [place];Living will [place];Living will  Does patient want to make changes to medical advance directive? No - Patient declined No - Patient declined No - Patient declined No - Patient declined - No - Patient declined No - Patient declined  Copy of [place] in Chart? No - copy requested No - copy requested No - copy requested No - copy requested - No - copy requested No - copy requested  Would patient like information on creating a medical advance directive? - No - Patient declined No - Patient declined No - Patient declined No - Patient declined - -  Some encounter information is confidential and restricted. Go to Review Flowsheets activity to see all data.    Current Medications (verified) Outpatient Encounter Medications as of 11/24/2019  Medication Sig  . Adalimumab (HUMIRA PEN) 40 MG/0.4ML PNKT Inject 40 mg into the skin every 14 (fourteen) days.   Marland Kitchen albuterol (VENTOLIN HFA) 108 (90 Base) MCG/ACT inhaler Inhale 2 puffs into the lungs every 6 (six) hours as needed.  . ALPRAZolam (XANAX) 0.25 MG tablet Take 1 tablet (0.25 mg total) by mouth at bedtime as needed for anxiety.  Marland Kitchen amLODipine (NORVASC) 2.5 MG tablet Take 2.5 mg by mouth daily.  . benzonatate  (TESSALON) 200 MG capsule Take 1 capsule (200 mg total) by mouth 3 (three) times daily as needed for cough.  . budesonide (PULMICORT) 0.25 MG/2ML nebulizer solution Take 2 mLs (0.25 mg total) by nebulization 2 (two) times daily.  Marland Kitchen BYSTOLIC 10 MG tablet TAKE 1 TABLET BY MOUTH EVERY DAY  . Cholecalciferol 50 MCG (2000 UT) CAPS Take by mouth.  . dicyclomine (BENTYL) 10 MG capsule TAKE 1 CAPSULE (10 MG TOTAL) BY MOUTH 4 (FOUR) TIMES DAILY - BEFORE MEALS AND AT BEDTIME. (Patient taking differently: Take 20 mg by mouth in the morning and at bedtime. )  . escitalopram (LEXAPRO) 10 MG tablet Take 1 tablet (10 mg total) by mouth daily.  . famotidine (PEPCID) 20 MG tablet Take 1 tablet (20 mg total) by mouth daily. Before breakfast or dinner  . fluticasone (FLONASE) 50 MCG/ACT nasal spray Place 1 spray into both nostrils 2 (two) times daily.  Marland Kitchen gabapentin (NEURONTIN) 300 MG capsule Take 2-3 capsules (600-900 mg total) by mouth See admin instructions. Take 600 mg by mouth in the morning and 900 mg at bedtime (Patient taking differently: Take 600-900 mg by mouth See admin instructions. Take 600 mg by mouth in the morning and bedtime)  . ipratropium-albuterol (DUONEB) 0.5-2.5 (3) MG/3ML SOLN Take 3 mLs by nebulization 3 (three) times daily as needed.  . lamoTRIgine (LAMICTAL) 100 MG tablet Take 1 tablet (100 mg total) by mouth 2 (two) times daily.  Marland Kitchen leflunomide (ARAVA) 20 MG tablet Take 1 tablet (20  mg total) by mouth daily.  Marland Kitchen lovastatin (MEVACOR) 20 MG tablet Take 1 tablet (20 mg total) by mouth at bedtime.  . montelukast (SINGULAIR) 10 MG tablet Take 1 tablet (10 mg total) by mouth daily.  . multivitamin-lutein (OCUVITE-LUTEIN) CAPS capsule Take 1 capsule by mouth daily.   Marland Kitchen MYRBETRIQ 50 MG TB24 tablet TAKE 1 TABLET BY MOUTH EVERYDAY AT BEDTIME (Patient taking differently: 50 mg daily. )  . pantoprazole (PROTONIX) 40 MG tablet Take 1 tablet (40 mg total) by mouth 2 (two) times daily. 30 minutes before food.  Note reduction in frequency  . predniSONE (DELTASONE) 10 MG tablet 2 tablets daily for 4 days then 1 tablet daily until seen again in follow-up  . predniSONE (STERAPRED UNI-PAK 21 TAB) 10 MG (21) TBPK tablet Take as directed in the package, this is a taper pack.  Marland Kitchen QUEtiapine (SEROQUEL) 25 MG tablet Take 1 tablet (25 mg total) by mouth at bedtime.  . sucralfate (CARAFATE) 1 g tablet Take 1 tablet (1 g total) by mouth 4 (four) times daily. TAKE 1 TABLET BY MOUTH 4 TIMES A DAY WITH MEALS AND AT BEDTIME (Patient taking differently: Take 1 g by mouth 2 (two) times daily. TAKE 1 TABLET BY MOUTH 4 TIMES A DAY WITH MEALS AND AT BEDTIME)  . Tiotropium Bromide Monohydrate (SPIRIVA RESPIMAT) 2.5 MCG/ACT AERS TAKE 1 PUFF BY MOUTH EVERY DAY   No facility-administered encounter medications on file as of 11/24/2019.    Allergies (verified) Ceftin [cefuroxime axetil]; Lisinopril; Morphine; Sulfasalazine; Aspirin; Antihistamines, chlorpheniramine-type; Antivert [meclizine hcl]; Decongestant [pseudoephedrine hcl]; Doxycycline; Polymyxin b; Sulfa antibiotics; Xarelto [rivaroxaban]; Adhesive [tape]; Iodine; Levaquin [levofloxacin in d5w]; and Tetanus toxoids   History: Past Medical History:  Diagnosis Date  . Anxiety   . Asthma   . Bipolar disorder ([place])   . Cataract    s/p b/l repair   . Chicken pox   . CKD (chronic kidney disease)   . CKD (chronic kidney disease), stage III ([place])    a. s/p R nephrectomy.  . Conversion disorder   . COPD (chronic obstructive pulmonary disease) ([place])   . Depression   . Essential hypertension   . GERD (gastroesophageal reflux disease)   . Hyperlipidemia   . Inflammatory arthritis    a. hands/carpal tunnel.  b. Low titer rheumatoid factor. c. Negative anti-CCP antibodies. d. Plaquenil.  . Non-Obstructive CAD    a. 07/2009 Cath ([facility]): nonobs dzs;  b. 03/2011 Cath [facility]): nonobs dzs.  . Osteoarthritis    a. Knees.  . PUD (peptic ulcer disease)   . S/P right hip fracture     11/01/16 s/p repair  . Shoulder pain   . Sleep apnea    no cpap  . Spinal stenosis at L4-L5 level    severe with L4/L5 anterolisthesis grade 1 anterolisthesis   . Toxic maculopathy   . Valvular heart disease    a. 07/2015 Echo: EF 55-60%, Mild AI, AS, MR, and TR.   Past Surgical History:  Procedure Laterality Date  . APPENDECTOMY    . BACK SURGERY    . BUNIONECTOMY Right   . CATARACT EXTRACTION, BILATERAL    . CESAREAN SECTION     x1  . CHOLECYSTECTOMY N/A 05/11/2016   Procedure: LAPAROSCOPIC CHOLECYSTECTOMY;  Surgeon: Florene Glen, MD;  Location: [facility] ORS;  Service: General;  Laterality: N/A;  . COLONOSCOPY WITH PROPOFOL N/A 04/02/2016   Procedure: COLONOSCOPY WITH PROPOFOL;  Surgeon: Jonathon Bellows, MD;  Location: [facility] ENDOSCOPY;  Service: Endoscopy;  Laterality: N/A;  . ENDOSCOPIC RETROGRADE CHOLANGIOPANCREATOGRAPHY (ERCP) WITH PROPOFOL N/A 05/08/2016   Procedure: ENDOSCOPIC RETROGRADE CHOLANGIOPANCREATOGRAPHY (ERCP) WITH PROPOFOL;  Surgeon: Lucilla Lame, MD;  Location: [facility] ENDOSCOPY;  Service: Endoscopy;  Laterality: N/A;  . ERCP     with biliary spincterotomy 05/08/16 Dr. Allen Norris for choledocholithiasis   . ESOPHAGEAL DILATION  04/02/2016   Procedure: ESOPHAGEAL DILATION;  Surgeon: Jonathon Bellows, MD;  Location: [facility] ENDOSCOPY;  Service: Endoscopy;;  . ESOPHAGOGASTRODUODENOSCOPY (EGD) WITH PROPOFOL N/A 04/02/2016   Procedure: ESOPHAGOGASTRODUODENOSCOPY (EGD) WITH PROPOFOL;  Surgeon: Jonathon Bellows, MD;  Location: [facility] ENDOSCOPY;  Service: Endoscopy;  Laterality: N/A;  . HIP ARTHROPLASTY Right 11/01/2016   Procedure: ARTHROPLASTY BIPOLAR HIP (HEMIARTHROPLASTY);  Surgeon: Corky Mull, MD;  Location: [facility] ORS;  Service: Orthopedics;  Laterality: Right;  . 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 osteoporosis     noted DEXA 08/19/16   . REPLACEMENT TOTAL KNEE Right   . REVERSE SHOULDER ARTHROPLASTY Right 11/04/2017   Procedure: REVERSE SHOULDER  ARTHROPLASTY;  Surgeon: Corky Mull, MD;  Location: [facility] ORS;  Service: Orthopedics;  Laterality: Right;  . REVERSE SHOULDER ARTHROPLASTY Left 07/26/2018   Procedure: REVERSE SHOULDER ARTHROPLASTY;  Surgeon: Corky Mull, MD;  Location: [facility] ORS;  Service: Orthopedics;  Laterality: Left;  . TONSILLECTOMY    . TOTAL HIP ARTHROPLASTY  12/10/11   [facility] left hip  . TOTAL HIP ARTHROPLASTY Bilateral   . TUBAL LIGATION     Family History  Problem Relation Age of Onset  . Rheum arthritis Mother   . Asthma Mother   . Parkinson's disease Mother   . Heart disease Mother   . Stroke Mother   . Hypertension Mother   . Heart attack Father   . Heart disease Father   . Hypertension Father   . Peripheral Artery Disease Father   . Diabetes Son   . Gout Son   . Asthma Sister   . Heart disease Sister   . Lung cancer Sister   . Heart disease Sister   . Heart disease Sister   . Breast cancer Sister   . Heart attack Sister   . Heart disease Brother   . Heart disease Maternal Grandmother   . Diabetes Maternal Grandmother   . Colon cancer Maternal Grandmother   . Cancer Maternal Grandmother        Hodgkins lymphoma  . Heart disease Brother   . Alcohol abuse Brother   . Depression Brother   . Dementia Son    Social History   Socioeconomic History  . Marital status: Widowed    Spouse name: richard  . Number of children: 2  . Years of education: Some Coll  . Highest education level: Some college, no degree  Occupational History  . Occupation: Retired    Comment: retired  Tobacco Use  . Smoking status: Former Smoker    Packs/day: 0.50    Years: 20.00    Pack years: 10.00    Types: Cigarettes    Quit date: 02/02/1974    Years since quitting: 45.8  . Smokeless tobacco: Never Used  Vaping Use  . Vaping Use: Never used  Substance and Sexual Activity  . Alcohol use: No  . Drug use: No  . Sexual activity: Not Currently  Other Topics Concern  . Not on file  Social History Narrative    Lives at home in [place] grandson, his wife and their child.   Husband  Richard Meleski died covid 11 late 1/2049married x 68 years   Right-handed.   6 cups coffee per day.   Social Determinants of Health   Financial Resource Strain: Low Risk   . Difficulty of Paying Living Expenses: Not hard at all  Food Insecurity: No Food Insecurity  . Worried About Charity fundraiser in the Last Year: Never true  . Ran Out of Food in the Last Year: Never true  Transportation Needs: No Transportation Needs  . Lack of Transportation (Medical): No  . Lack of Transportation (Non-Medical): No  Physical Activity: Inactive  . Days of Exercise per Week: 0 days  . Minutes of Exercise per Session: 0 min  Stress: No Stress Concern Present  . Feeling of Stress : Not at all  Social Connections:   . Frequency of Communication with Friends and Family: Not on file  . Frequency of Social Gatherings with Friends and Family: Not on file  . Attends Religious Services: Not on file  . Active Member of Clubs or Organizations: Not on file  . Attends Archivist Meetings: Not on file  . Marital Status: Not on file    Tobacco Counseling Counseling given: Not Answered   Clinical Intake:  Pre-visit preparation completed: Yes        Diabetes: No  How often do you need to have someone help you when you read instructions, pamphlets, or other written materials from your doctor or pharmacy?: 1 - Never       Activities of Daily Living In your present state of health, do you have any difficulty performing the following activities: 11/24/2019  Hearing? N  Vision? N  Difficulty concentrating or making decisions? N  Walking or climbing stairs? Y  Comment Cane/walker in use as needed  Dressing or bathing? N  Doing errands, shopping? Y  Comment She does not currently drive or run errands alone  Preparing Food and eating ? Y  Using the Toilet? N  In the past six months, have you accidently leaked  urine? Y  Comment Managed with daily brief  Do you have problems with loss of bowel control? N  Managing your Medications? N  Managing your Finances? Y  Comment Granddaughter Writer or managing your Housekeeping? Y  Comment Granddaughter assist  Some recent data might be hidden    Patient Care Team: McLean-Scocuzza, Nino Glow, MD as PCP - General (Internal Medicine)  Indicate any recent Medical Services you may have received from other than [facility] providers in the past year (date may be approximate).     Assessment:   This is a routine wellness examination for Alissandra.  I connected with Shamira today by telephone and verified that I am speaking with the correct person using two identifiers. Location patient: home Location provider: work Persons participating in the virtual visit: patient, Marine scientist.    I discussed the limitations, risks, security and privacy concerns of performing an evaluation and management service by telephone and the availability of in person appointments. The patient expressed understanding and verbally consented to this telephonic visit.    Interactive audio and video telecommunications were attempted between this provider and patient, however failed, due to patient having technical difficulties OR patient did not have access to video capability.  We continued and completed visit with audio only.  Some vital signs may be absent or patient reported.   Hearing/Vision screen  Hearing Screening   125Hz  250Hz  500Hz  1000Hz  2000Hz  3000Hz  4000Hz  6000Hz  8000Hz   Right  ear:           Left ear:           Comments: Patient is able to hear conversational tones without difficulty.  No issues reported.  Vision Screening Comments: Cataract extraction, bilateral  Visual acuity not assessed, virtual visit  Dietary issues and exercise activities discussed: Current Exercise Habits: Home exercise routine (Physical therapy), Intensity: Mild, Exercise limited by: orthopedic  condition(s)  Goals    . Follow up with Primary Care Provider     As needed      Depression Screen PHQ 2/9 Scores 11/24/2019 03/16/2019 11/23/2018 07/07/2018 02/24/2018 10/19/2017 07/19/2017  PHQ - 2 Score 0 4 0 0 0 0 0  PHQ- 9 Score - 16 - - - - -  Exception Documentation - - - - - - -  Some encounter information is confidential and restricted. Go to Review Flowsheets activity to see all data.    Fall Risk Fall Risk  08/29/2019 06/16/2019 04/20/2019 03/16/2019 11/23/2018  Falls in the past year? 1 1 1 1 1   Number falls in past yr: 1 1 1 1 1   Injury with Fall? 1 1 0 1 1  Risk Factor Category  - - - - -  Comment - - - - -  Risk for fall due to : Impaired balance/gait;Impaired mobility History of fall(s);Impaired mobility;Impaired balance/gait History of fall(s);Impaired balance/gait History of fall(s) History of fall(s)  Follow up Falls evaluation completed Falls evaluation completed Falls evaluation completed Falls evaluation completed Falls prevention discussed;Education provided   Handrails in use when climbing stairs? Yes Home free of loose throw rugs in walkways, pet beds, electrical cords, etc? Yes  Adequate lighting in your home to reduce risk of falls? Yes   ASSISTIVE DEVICES UTILIZED TO PREVENT FALLS: Use of a cane, walker or w/c? Cane/walker available if needed.   TIMED UP AND GO: Was the test performed? No . Virtual visit.   Cognitive Function: MMSE - Mini Mental State Exam 03/01/2017 02/28/2016 07/11/2015  Orientation to time 5 5 4   Orientation to Place 5 5 5   Registration 3 3 3   Attention/ Calculation 5 5 5   Recall 3 3 1   Language- name 2 objects 2 2 2   Language- repeat 1 1 1   Language- follow 3 step command 3 3 3   Language- read & follow direction 1 1 1   Write a sentence 1 1 1   Copy design 1 1 1   Total score 30 30 27        Immunizations Immunization History  Administered Date(s) Administered  . Fluad Quad(high Dose 65+) 10/03/2018, 10/16/2019  . Influenza Split  10/04/2012, 10/25/2013, 10/28/2016  . Influenza Whole 11/03/2011  . Influenza, High Dose Seasonal PF 10/04/2015, 11/03/2016  . Influenza,inj,Quad PF,6+ Mos 10/31/2014  . Influenza-Unspecified 01/02/2010, 11/24/2011, 10/21/2013, 10/31/2014, 09/28/2017  . Pneumococcal Conjugate-13 10/21/2013  . Pneumococcal Polysaccharide-23 11/07/2015   Health Maintenance Health Maintenance  Topic Date Due  . HEMOGLOBIN A1C  06/01/2019  . Hepatitis C Screening  11/23/2020 (Originally 1940-10-26)  . INFLUENZA VACCINE  Completed  . DEXA SCAN  Completed  . PNA vac Low Risk Adult  Completed  . FOOT EXAM  Discontinued  . OPHTHALMOLOGY EXAM  Discontinued  . URINE MICROALBUMIN  Discontinued  . COVID-19 Vaccine  Discontinued   Dental Screening: Recommended annual dental exams for proper oral hygiene.  Cpap- not currently in use.   Hep C Screening- deferred per patient preference.   Community Resource Referral / Chronic Care Management: CRR  required this visit?  No   CCM required this visit?  No      Plan:   Keep all routine maintenance appointments.   Next scheduled lab 12/01/19 @ 10:00  Follow up 12/08/19 @ 1:30  I have personally reviewed and noted the following in the patient's chart:   . Medical and social history . Use of alcohol, tobacco or illicit drugs  . Current medications and supplements . Functional ability and status . Nutritional status . Physical activity . Advanced directives . List of other physicians . Hospitalizations, surgeries, and ER visits in previous 12 months . Vitals . Screenings to include cognitive, depression, and falls . Referrals and appointments  In addition, I have reviewed and discussed with patient certain preventive protocols, quality metrics, and best practice recommendations. A written personalized care plan for preventive services as well as general preventive health recommendations were provided to patient via mychart.     Varney Biles, LPN   52/09/221

## 2019-11-25 DIAGNOSIS — S42122K Displaced fracture of acromial process, left shoulder, subsequent encounter for fracture with nonunion: Secondary | ICD-10-CM | POA: Diagnosis not present

## 2019-11-26 DIAGNOSIS — I350 Nonrheumatic aortic (valve) stenosis: Secondary | ICD-10-CM | POA: Diagnosis not present

## 2019-11-26 DIAGNOSIS — I130 Hypertensive heart and chronic kidney disease with heart failure and stage 1 through stage 4 chronic kidney disease, or unspecified chronic kidney disease: Secondary | ICD-10-CM | POA: Diagnosis not present

## 2019-11-26 DIAGNOSIS — E1142 Type 2 diabetes mellitus with diabetic polyneuropathy: Secondary | ICD-10-CM | POA: Diagnosis not present

## 2019-11-26 DIAGNOSIS — J449 Chronic obstructive pulmonary disease, unspecified: Secondary | ICD-10-CM | POA: Diagnosis not present

## 2019-11-26 DIAGNOSIS — N183 Chronic kidney disease, stage 3 unspecified: Secondary | ICD-10-CM | POA: Diagnosis not present

## 2019-11-26 DIAGNOSIS — E1122 Type 2 diabetes mellitus with diabetic chronic kidney disease: Secondary | ICD-10-CM | POA: Diagnosis not present

## 2019-11-26 DIAGNOSIS — I2510 Atherosclerotic heart disease of native coronary artery without angina pectoris: Secondary | ICD-10-CM | POA: Diagnosis not present

## 2019-11-26 DIAGNOSIS — T84018D Broken internal joint prosthesis, other site, subsequent encounter: Secondary | ICD-10-CM | POA: Diagnosis not present

## 2019-11-26 DIAGNOSIS — I509 Heart failure, unspecified: Secondary | ICD-10-CM | POA: Diagnosis not present

## 2019-11-29 ENCOUNTER — Encounter: Payer: Self-pay | Admitting: Pulmonary Disease

## 2019-11-29 DIAGNOSIS — G4730 Sleep apnea, unspecified: Secondary | ICD-10-CM | POA: Diagnosis not present

## 2019-11-29 DIAGNOSIS — R0683 Snoring: Secondary | ICD-10-CM | POA: Diagnosis not present

## 2019-11-30 ENCOUNTER — Ambulatory Visit: Payer: Medicare PPO | Admitting: Podiatry

## 2019-12-01 ENCOUNTER — Other Ambulatory Visit: Payer: Self-pay | Admitting: Pulmonary Disease

## 2019-12-01 ENCOUNTER — Other Ambulatory Visit (INDEPENDENT_AMBULATORY_CARE_PROVIDER_SITE_OTHER): Payer: Medicare PPO

## 2019-12-01 ENCOUNTER — Other Ambulatory Visit: Payer: Self-pay

## 2019-12-01 DIAGNOSIS — R7303 Prediabetes: Secondary | ICD-10-CM

## 2019-12-01 DIAGNOSIS — E781 Pure hyperglyceridemia: Secondary | ICD-10-CM | POA: Diagnosis not present

## 2019-12-01 DIAGNOSIS — I10 Essential (primary) hypertension: Secondary | ICD-10-CM

## 2019-12-01 DIAGNOSIS — N1832 Chronic kidney disease, stage 3b: Secondary | ICD-10-CM

## 2019-12-01 DIAGNOSIS — Z1389 Encounter for screening for other disorder: Secondary | ICD-10-CM

## 2019-12-01 LAB — LIPID PANEL
Cholesterol: 176 mg/dL (ref 0–200)
HDL: 53.6 mg/dL (ref >39.00)
NonHDL: 122.45
Total CHOL/HDL Ratio: 3
Triglycerides: 360 mg/dL — ABNORMAL HIGH (ref 0.0–149.0)
VLDL: 72 mg/dL — ABNORMAL HIGH (ref 0.0–40.0)

## 2019-12-01 LAB — TSH: TSH: 4.8 u[IU]/mL — ABNORMAL HIGH (ref 0.35–4.50)

## 2019-12-01 LAB — LDL CHOLESTEROL, DIRECT: Direct LDL: 60 mg/dL

## 2019-12-01 LAB — HEMOGLOBIN A1C: Hgb A1c MFr Bld: 6.1 % (ref 4.6–6.5)

## 2019-12-01 NOTE — Addendum Note (Signed)
Addended by: Ezequiel Ganser on: 12/01/2019 10:35 AM   Modules accepted: Orders

## 2019-12-04 ENCOUNTER — Ambulatory Visit: Payer: Medicare PPO | Admitting: Podiatry

## 2019-12-04 ENCOUNTER — Encounter: Payer: Self-pay | Admitting: Podiatry

## 2019-12-04 ENCOUNTER — Other Ambulatory Visit: Payer: Self-pay

## 2019-12-04 DIAGNOSIS — M2010 Hallux valgus (acquired), unspecified foot: Secondary | ICD-10-CM

## 2019-12-04 DIAGNOSIS — B351 Tinea unguium: Secondary | ICD-10-CM | POA: Diagnosis not present

## 2019-12-04 DIAGNOSIS — M79675 Pain in left toe(s): Secondary | ICD-10-CM

## 2019-12-04 DIAGNOSIS — M79674 Pain in right toe(s): Secondary | ICD-10-CM | POA: Diagnosis not present

## 2019-12-04 DIAGNOSIS — N183 Chronic kidney disease, stage 3 unspecified: Secondary | ICD-10-CM

## 2019-12-04 NOTE — Progress Notes (Signed)
This patient returns to my office for at risk foot care.  This patient requires this care by a professional since this patient will be at risk due to having chronic kidney disease.  This patient is unable to cut nails himself since the patient cannot reach his nails.These nails are painful walking and wearing shoes.  This patient presents for at risk foot care today.  General Appearance  Alert, conversant and in no acute stress.  Vascular  Dorsalis pedis  are palpable  bilaterally. Posterior tibial pulses are absent  B/L. Capillary return is within normal limits  bilaterally. Temperature is within normal limits  bilaterally.  Neurologic  Senn-Weinstein monofilament wire test diminished  bilaterally. Muscle power within normal limits bilaterally.  Nails Thick disfigured discolored nails with subungual debris  from hallux to fifth toes bilaterally. No evidence of bacterial infection or drainage bilaterally.  Orthopedic  No limitations of motion  feet .  No crepitus or effusions noted.  No bony pathology or digital deformities noted.  Right ankle swelling.  HAV  B/L.  Pes planus  B/L.  Skin  normotropic skin with no porokeratosis noted bilaterally.  No signs of infections or ulcers noted.     Onychomycosis  Pain in right toes  Pain in left toes  Consent was obtained for treatment procedures.   Mechanical debridement of nails 1-5  bilaterally performed with a nail nipper.  Filed with dremel without incident.    Return office visit    3 months                  Told patient to return for periodic foot care and evaluation due to potential at risk complications.   Gardiner Barefoot DPM

## 2019-12-06 ENCOUNTER — Other Ambulatory Visit: Payer: Self-pay

## 2019-12-06 ENCOUNTER — Ambulatory Visit: Payer: Medicare PPO | Admitting: Internal Medicine

## 2019-12-06 ENCOUNTER — Other Ambulatory Visit: Payer: Self-pay | Admitting: Surgery

## 2019-12-06 ENCOUNTER — Encounter: Payer: Self-pay | Admitting: Internal Medicine

## 2019-12-06 VITALS — BP 110/68 | HR 73 | Temp 98.2°F | Ht 61.5 in | Wt 165.0 lb

## 2019-12-06 DIAGNOSIS — J309 Allergic rhinitis, unspecified: Secondary | ICD-10-CM

## 2019-12-06 DIAGNOSIS — F419 Anxiety disorder, unspecified: Secondary | ICD-10-CM

## 2019-12-06 DIAGNOSIS — I10 Essential (primary) hypertension: Secondary | ICD-10-CM | POA: Diagnosis not present

## 2019-12-06 DIAGNOSIS — E559 Vitamin D deficiency, unspecified: Secondary | ICD-10-CM | POA: Diagnosis not present

## 2019-12-06 DIAGNOSIS — G4700 Insomnia, unspecified: Secondary | ICD-10-CM | POA: Diagnosis not present

## 2019-12-06 DIAGNOSIS — R1031 Right lower quadrant pain: Secondary | ICD-10-CM

## 2019-12-06 DIAGNOSIS — I38 Endocarditis, valve unspecified: Secondary | ICD-10-CM

## 2019-12-06 DIAGNOSIS — R7989 Other specified abnormal findings of blood chemistry: Secondary | ICD-10-CM | POA: Diagnosis not present

## 2019-12-06 DIAGNOSIS — S42122P Displaced fracture of acromial process, left shoulder, subsequent encounter for fracture with malunion: Secondary | ICD-10-CM

## 2019-12-06 DIAGNOSIS — I2510 Atherosclerotic heart disease of native coronary artery without angina pectoris: Secondary | ICD-10-CM

## 2019-12-06 DIAGNOSIS — J8489 Other specified interstitial pulmonary diseases: Secondary | ICD-10-CM

## 2019-12-06 DIAGNOSIS — Z1389 Encounter for screening for other disorder: Secondary | ICD-10-CM | POA: Diagnosis not present

## 2019-12-06 DIAGNOSIS — R059 Cough, unspecified: Secondary | ICD-10-CM

## 2019-12-06 DIAGNOSIS — D72829 Elevated white blood cell count, unspecified: Secondary | ICD-10-CM

## 2019-12-06 MED ORDER — ALPRAZOLAM 0.25 MG PO TABS: 0.2500 mg | ORAL_TABLET | Freq: Every evening | ORAL | 0 refills | Status: DC | PRN

## 2019-12-06 NOTE — Progress Notes (Signed)
Chief Complaint  Patient presents with  . Follow-up   F/u  1. Anxiety/insomnia changing psych and in the meantime takes xanax .25 for sleeps and wants to know if I will refill  2. Pending ORIF left humerus 12/21/19 Dr. Roland Rack and will go to rehab x 20 days [place] she is ready for surgery and overall doing well  Pain 5/10  3. Grief still missing her husband of 50+ years but doing well since his loss overall  4. Elevated TSh given paper to repeat lab stsh, cbc (elevated WBC), and vitamin D [facility] labs or [facility]  5. Updated chronic med sig today  Review of Systems  Constitutional: Negative for weight loss.  HENT: Negative for hearing loss.   Eyes: Negative for blurred vision.  Respiratory: Negative for shortness of breath.   Cardiovascular: Negative for chest pain.  Musculoskeletal: Positive for falls and joint pain.       Falls x 2   Skin: Negative for rash.  Psychiatric/Behavioral: Negative for memory loss.   Past Medical History:  Diagnosis Date  . Anxiety   . Asthma   . Bipolar disorder ([place])   . C. difficile diarrhea    from Abx 2010 hospitalized   . Cataract    s/p b/l repair   . Chicken pox   . CKD (chronic kidney disease)   . CKD (chronic kidney disease), stage III ([place])    a. s/p R nephrectomy.  . Conversion disorder   . COPD (chronic obstructive pulmonary disease) ([place])   . Depression   . Essential hypertension   . GERD (gastroesophageal reflux disease)   . Hyperlipidemia   . Inflammatory arthritis    a. hands/carpal tunnel.  b. Low titer rheumatoid factor. c. Negative anti-CCP antibodies. d. Plaquenil.  . Non-Obstructive CAD    a. 07/2009 Cath ([facility]): nonobs dzs;  b. 03/2011 Cath [facility]): nonobs dzs.  . Osteoarthritis    a. Knees.  . PUD (peptic ulcer disease)   . S/P right hip fracture    11/01/16 s/p repair  . Shoulder pain   . Sleep apnea    no cpap  . Spinal stenosis at L4-L5 level    severe with L4/L5 anterolisthesis grade 1  anterolisthesis   . Toxic maculopathy   . Valvular heart disease    a. 07/2015 Echo: EF 55-60%, Mild AI, AS, MR, and TR.   Past Surgical History:  Procedure Laterality Date  . APPENDECTOMY    . BACK SURGERY    . BUNIONECTOMY Right   . CATARACT EXTRACTION, BILATERAL    . CESAREAN SECTION     x1  . CHOLECYSTECTOMY N/A 05/11/2016   Procedure: LAPAROSCOPIC CHOLECYSTECTOMY;  Surgeon: Florene Glen, MD;  Location: [facility] ORS;  Service: General;  Laterality: N/A;  . COLONOSCOPY WITH PROPOFOL N/A 04/02/2016   Procedure: COLONOSCOPY WITH PROPOFOL;  Surgeon: Jonathon Bellows, MD;  Location: [facility] ENDOSCOPY;  Service: Endoscopy;  Laterality: N/A;  . ENDOSCOPIC RETROGRADE CHOLANGIOPANCREATOGRAPHY (ERCP) WITH PROPOFOL N/A 05/08/2016   Procedure: ENDOSCOPIC RETROGRADE CHOLANGIOPANCREATOGRAPHY (ERCP) WITH PROPOFOL;  Surgeon: Lucilla Lame, MD;  Location: [facility] ENDOSCOPY;  Service: Endoscopy;  Laterality: N/A;  . ERCP     with biliary spincterotomy 05/08/16 Dr. Allen Norris for choledocholithiasis   . ESOPHAGEAL DILATION  04/02/2016   Procedure: ESOPHAGEAL DILATION;  Surgeon: Jonathon Bellows, MD;  Location: [facility] ENDOSCOPY;  Service: Endoscopy;;  . ESOPHAGOGASTRODUODENOSCOPY (EGD) WITH PROPOFOL N/A 04/02/2016   Procedure: ESOPHAGOGASTRODUODENOSCOPY (EGD) WITH PROPOFOL;  Surgeon: Jonathon Bellows, MD;  Location: [facility]  ENDOSCOPY;  Service: Endoscopy;  Laterality: N/A;  . HIP ARTHROPLASTY Right 11/01/2016   Procedure: ARTHROPLASTY BIPOLAR HIP (HEMIARTHROPLASTY);  Surgeon: Corky Mull, MD;  Location: [facility] ORS;  Service: Orthopedics;  Laterality: Right;  . 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 osteoporosis     noted DEXA 08/19/16   . REPLACEMENT TOTAL KNEE Right   . REVERSE SHOULDER ARTHROPLASTY Right 11/04/2017   Procedure: REVERSE SHOULDER ARTHROPLASTY;  Surgeon: Corky Mull, MD;  Location: [facility] ORS;  Service: Orthopedics;  Laterality: Right;  . REVERSE SHOULDER ARTHROPLASTY Left 07/26/2018    Procedure: REVERSE SHOULDER ARTHROPLASTY;  Surgeon: Corky Mull, MD;  Location: [facility] ORS;  Service: Orthopedics;  Laterality: Left;  . TONSILLECTOMY    . TOTAL HIP ARTHROPLASTY  12/10/11   [facility] left hip  . TOTAL HIP ARTHROPLASTY Bilateral   . TUBAL LIGATION     Family History  Problem Relation Age of Onset  . Rheum arthritis Mother   . Asthma Mother   . Parkinson's disease Mother   . Heart disease Mother   . Stroke Mother   . Hypertension Mother   . Heart attack Father   . Heart disease Father   . Hypertension Father   . Peripheral Artery Disease Father   . Diabetes Son   . Gout Son   . Asthma Sister   . Heart disease Sister   . Lung cancer Sister   . Heart disease Sister   . Heart disease Sister   . Breast cancer Sister   . Heart attack Sister   . Heart disease Brother   . Heart disease Maternal Grandmother   . Diabetes Maternal Grandmother   . Colon cancer Maternal Grandmother   . Cancer Maternal Grandmother        Hodgkins lymphoma  . Heart disease Brother   . Alcohol abuse Brother   . Depression Brother   . Dementia Son    Social History   Socioeconomic History  . Marital status: Widowed    Spouse name: richard  . Number of children: 2  . Years of education: Some Coll  . Highest education level: Some college, no degree  Occupational History  . Occupation: Retired    Comment: retired  Tobacco Use  . Smoking status: Former Smoker    Packs/day: 0.50    Years: 20.00    Pack years: 10.00    Types: Cigarettes    Quit date: 02/02/1974    Years since quitting: 45.8  . Smokeless tobacco: Never Used  Vaping Use  . Vaping Use: Never used  Substance and Sexual Activity  . Alcohol use: No  . Drug use: No  . Sexual activity: Not Currently  Other Topics Concern  . Not on file  Social History Narrative   Lives at home in [place] grandson, his wife and their child.   Husband Brendi Mccarroll died covid 41 late 1/2040married x 79 years   Right-handed.   6 cups  coffee per day.   Social Determinants of Health   Financial Resource Strain: Low Risk   . Difficulty of Paying Living Expenses: Not hard at all  Food Insecurity: No Food Insecurity  . Worried About Charity fundraiser in the Last Year: Never true  . Ran Out of Food in the Last Year: Never true  Transportation Needs: No Transportation Needs  . Lack of Transportation (Medical): No  . Lack of Transportation (Non-Medical): No  Physical Activity: Inactive  . Days of Exercise per Week: 0 days  . Minutes of Exercise per Session: 0 min  Stress: No Stress Concern Present  . Feeling of Stress : Not at all  Social Connections:   . Frequency of Communication with Friends and Family: Not on file  . Frequency of Social Gatherings with Friends and Family: Not on file  . Attends Religious Services: Not on file  . Active Member of Clubs or Organizations: Not on file  . Attends Archivist Meetings: Not on file  . Marital Status: Not on file  Intimate Partner Violence:   . Fear of Current or Ex-Partner: Not on file  . Emotionally Abused: Not on file  . Physically Abused: Not on file  . Sexually Abused: Not on file   Current Meds  Medication Sig  . Adalimumab (HUMIRA PEN) 40 MG/0.4ML PNKT Inject 40 mg into the skin every 14 (fourteen) days.   Marland Kitchen albuterol (VENTOLIN HFA) 108 (90 Base) MCG/ACT inhaler Inhale 2 puffs into the lungs every 6 (six) hours as needed. (Patient taking differently: Inhale 2 puffs into the lungs every 6 (six) hours as needed for wheezing or shortness of breath. )  . ALPRAZolam (XANAX) 0.25 MG tablet Take 1 tablet (0.25 mg total) by mouth at bedtime as needed for anxiety. (Patient taking differently: Take 0.25 mg by mouth at bedtime. )  . amLODipine (NORVASC) 2.5 MG tablet Take 2.5 mg by mouth daily.  . benzonatate (TESSALON) 200 MG capsule Take 1 capsule (200 mg total) by mouth 3 (three) times daily as needed for cough.  . budesonide (PULMICORT) 0.25 MG/2ML nebulizer  solution USE 1 VIAL  IN  NEBULIZER TWICE  DAILY - rinse mouth after treatment (Patient taking differently: Take 0.25 mg by nebulization daily. )  . Cholecalciferol 50 MCG (2000 UT) CAPS Take 4,000 Units by mouth daily.   Marland Kitchen dicyclomine (BENTYL) 10 MG capsule TAKE 1 CAPSULE (10 MG TOTAL) BY MOUTH 4 (FOUR) TIMES DAILY - BEFORE MEALS AND AT BEDTIME. (Patient taking differently: Take 20 mg by mouth in the morning and at bedtime. )  . escitalopram (LEXAPRO) 10 MG tablet Take 1 tablet (10 mg total) by mouth daily.  . famotidine (PEPCID) 20 MG tablet Take 1 tablet (20 mg total) by mouth daily. Before breakfast or dinner  . fluticasone (FLONASE) 50 MCG/ACT nasal spray Place 2 sprays into both nostrils daily. In am  . gabapentin (NEURONTIN) 300 MG capsule Take 2 capsules (600 mg total) by mouth in the morning and at bedtime.  Marland Kitchen ipratropium-albuterol (DUONEB) 0.5-2.5 (3) MG/3ML SOLN Take 3 mLs by nebulization 3 (three) times daily as needed. (Patient taking differently: Take 3 mLs by nebulization 3 (three) times daily as needed (shortness of breath). )  . lamoTRIgine (LAMICTAL) 100 MG tablet Take 1 tablet (100 mg total) by mouth 2 (two) times daily.  Marland Kitchen leflunomide (ARAVA) 20 MG tablet Take 1 tablet (20 mg total) by mouth daily.  Marland Kitchen lovastatin (MEVACOR) 20 MG tablet Take 1 tablet (20 mg total) by mouth at bedtime.  . montelukast (SINGULAIR) 10 MG tablet Take 1 tablet (10 mg total) by mouth daily.  . multivitamin-lutein (OCUVITE-LUTEIN) CAPS capsule Take 1 capsule by mouth daily.   Marland Kitchen MYRBETRIQ 50 MG TB24 tablet TAKE 1 TABLET BY MOUTH EVERYDAY AT BEDTIME (Patient taking differently: Take 50 mg by mouth in the morning. )  . nebivolol (BYSTOLIC) 10 MG tablet Take 0.5 tablets (5 mg total) by mouth in the  morning and at bedtime.  . pantoprazole (PROTONIX) 40 MG tablet Take 1 tablet (40 mg total) by mouth 2 (two) times daily. 30 minutes before food. Note reduction in frequency  . predniSONE (DELTASONE) 10 MG tablet 2  tablets daily for 4 days then 1 tablet daily until seen again in follow-up (Patient taking differently: Take 10 mg by mouth daily. )  . QUEtiapine (SEROQUEL) 25 MG tablet Take 1 tablet (25 mg total) by mouth at bedtime.  . sucralfate (CARAFATE) 1 g tablet Take 1 tablet (1 g total) by mouth 2 (two) times daily.  . [DISCONTINUED] ALPRAZolam (XANAX) 0.25 MG tablet Take 1 tablet (0.25 mg total) by mouth at bedtime as needed for anxiety.  . [DISCONTINUED] BYSTOLIC 10 MG tablet TAKE 1 TABLET BY MOUTH EVERY DAY (Patient taking differently: Take 5 mg by mouth in the morning and at bedtime. )  . [DISCONTINUED] fluticasone (FLONASE) 50 MCG/ACT nasal spray Place 1 spray into both nostrils 2 (two) times daily. (Patient taking differently: Place 2 sprays into both nostrils daily. )  . [DISCONTINUED] gabapentin (NEURONTIN) 300 MG capsule Take 2-3 capsules (600-900 mg total) by mouth See admin instructions. Take 600 mg by mouth in the morning and 900 mg at bedtime (Patient taking differently: Take 600 mg by mouth in the morning and at bedtime. )  . [DISCONTINUED] predniSONE (STERAPRED UNI-PAK 21 TAB) 10 MG (21) TBPK tablet Take as directed in the package, this is a taper pack.  . [DISCONTINUED] sucralfate (CARAFATE) 1 g tablet Take 1 tablet (1 g total) by mouth 4 (four) times daily. TAKE 1 TABLET BY MOUTH 4 TIMES A DAY WITH MEALS AND AT BEDTIME (Patient taking differently: Take 1 g by mouth 2 (two) times daily. )   Allergies  Allergen Reactions  . Ceftin [Cefuroxime Axetil] Anaphylaxis  . Lisinopril Anaphylaxis  . Morphine Other (See Comments)    Per patient, low blood pressure issues that requires action to raise it back up. Can take small infrequent doses  . Sulfasalazine Anaphylaxis  . Aspirin Other (See Comments)    Sulfasalazine allergy cross reacts  . Antihistamines, Chlorpheniramine-Type Other (See Comments)    Makes pt hyper  . Antivert [Meclizine Hcl] Other (See Comments)    Bladder will not empty   . Decongestant [Pseudoephedrine Hcl] Other (See Comments)    Makes pt hyper  . Doxycycline Other (See Comments)    GI upset  . Sulfa Antibiotics Other (See Comments)    Face swelling  . Xarelto [Rivaroxaban] Other (See Comments)    Stomach burning, bleeding, and tar in stool  . Adhesive [Tape] Rash  . Contrast Media [Iodinated Diagnostic Agents] Rash    she is able to use betadine scrubs.  [place] [Levofloxacin In D5w] Rash  . Polymyxin B     Facial rash  . Tetanus Toxoids Rash and Other (See Comments)    Fever and hot to touch at injection site   Recent Results (from the past 2160 hour(s))  BLADDER SCAN AMB NON-IMAGING     Status: None   Collection Time: 11/21/19  1:46 PM  Result Value Ref Range   Scan Result 162ml   TSH     Status: Abnormal   Collection Time: 12/01/19 10:17 AM  Result Value Ref Range   TSH 4.80 (H) 0.35 - 4.50 uIU/mL  Hemoglobin A1c     Status: None   Collection Time: 12/01/19 10:17 AM  Result Value Ref Range   Hgb A1c MFr Bld 6.1 4.6 -  6.5 %    Comment: Glycemic Control Guidelines for People with Diabetes:Non Diabetic:  <6%Goal of Therapy: <7%Additional Action Suggested:  >8%   Lipid panel     Status: Abnormal   Collection Time: 12/01/19 10:17 AM  Result Value Ref Range   Cholesterol 176 0 - 200 mg/dL    Comment: ATP III Classification       Desirable:  < 200 mg/dL               Borderline High:  200 - 239 mg/dL          High:  > = 240 mg/dL   Triglycerides 360.0 (H) 0 - 149 mg/dL    Comment: Normal:  <150 mg/dLBorderline High:  150 - 199 mg/dL   HDL 53.60 >39.00 mg/dL   VLDL 72.0 (H) 0.0 - 40.0 mg/dL   Total CHOL/HDL Ratio 3     Comment:                Men          Women1/2 Average Risk     3.4          3.3Average Risk          5.0          4.42X Average Risk          9.6          7.13X Average Risk          15.0          11.0                       NonHDL 122.45     Comment: NOTE:  Non-HDL goal should be 30 mg/dL higher than patient's LDL goal  (i.e. LDL goal of < 70 mg/dL, would have non-HDL goal of < 100 mg/dL)  LDL cholesterol, direct     Status: None   Collection Time: 12/01/19 10:17 AM  Result Value Ref Range   Direct LDL 60.0 mg/dL    Comment: Optimal:  <100 mg/dLNear or Above Optimal:  100-129 mg/dLBorderline High:  130-159 mg/dLHigh:  160-189 mg/dLVery High:  >190 mg/dL  Urinalysis, Routine w reflex microscopic     Status: Abnormal   Collection Time: 12/06/19 12:10 PM  Result Value Ref Range   Color, Urine YELLOW YELLOW   APPearance CLEAR CLEAR   Specific Gravity, Urine 1.017 1.001 - 1.03   pH 6.5 5.0 - 8.0   Glucose, UA NEGATIVE NEGATIVE   Bilirubin Urine NEGATIVE NEGATIVE   Ketones, ur NEGATIVE NEGATIVE   Hgb urine dipstick NEGATIVE NEGATIVE   Protein, ur NEGATIVE NEGATIVE   Nitrite NEGATIVE NEGATIVE   Leukocytes,Ua TRACE (A) NEGATIVE   WBC, UA NONE SEEN 0 - 5 /HPF   RBC / HPF NONE SEEN 0 - 2 /HPF   Squamous Epithelial / LPF 0-5 < OR = 5 /HPF   Bacteria, UA NONE SEEN NONE SEEN /HPF   Hyaline Cast NONE SEEN NONE SEEN /LPF   Objective  Body mass index is 30.67 kg/m. Wt Readings from Last 3 Encounters:  12/06/19 165 lb (74.8 kg)  11/24/19 161 lb (73 kg)  11/21/19 161 lb (73 kg)   Temp Readings from Last 3 Encounters:  12/06/19 98.2 F (36.8 C) (Oral)  11/21/19 (!) 97.1 F (36.2 C) (Temporal)  10/18/19 (!) 97.1 F (36.2 C) (Temporal)   BP Readings from Last 3 Encounters:  12/06/19 110/68  11/21/19 130/80  11/21/19  114/66   Pulse Readings from Last 3 Encounters:  12/06/19 73  11/21/19 70  11/21/19 73    Physical Exam Vitals and nursing note reviewed.  Constitutional:      Appearance: Normal appearance. She is well-developed and well-groomed. She is obese.  HENT:     Head: Normocephalic and atraumatic.  Eyes:     Conjunctiva/sclera: Conjunctivae normal.     Pupils: Pupils are equal, round, and reactive to light.  Cardiovascular:     Rate and Rhythm: Normal rate and regular rhythm.      Heart sounds: Normal heart sounds.  Pulmonary:     Effort: Pulmonary effort is normal.     Breath sounds: Normal breath sounds.  Skin:    General: Skin is warm and dry.  Neurological:     General: No focal deficit present.     Mental Status: She is alert and oriented to person, place, and time. Mental status is at baseline.     Gait: Gait normal.  Psychiatric:        Attention and Perception: Attention and perception normal.        Mood and Affect: Mood and affect normal.        Speech: Speech normal.        Behavior: Behavior normal. Behavior is cooperative.        Thought Content: Thought content normal.        Cognition and Memory: Cognition and memory normal.        Judgment: Judgment normal.     Assessment  Plan  Elevated TSH - Plan: TSH, T4, free  Leukocytosis, unspecified type - Plan: CBC with Differential/Platelet If continues consider hematology consult in future   Vitamin D deficiency - Plan: Vitamin D (25 hydroxy)  Anxiety - Plan: ALPRAZolam (XANAX) 0.25 MG tablet Insomnia, unspecified type - Plan: ALPRAZolam (XANAX) 0.25 MG tablet F/u with psych changing for now and she states she needs this med to sleep/anxiety at times I will fill if psych decides not to disc today with patient this is chronic med for her   Hypertension, controlled - Plan: nebivolol (BYSTOLIC) 5 MG tablet bid Coronary artery disease involving native coronary artery of native heart without angina pectoris  Monitor BP  Cough, chronic with asthma, IPF, NSIP related to RA,  - Plan: gabapentin (NEURONTIN) 600 MG capsule bid per pulm F/u [facility] rheumatology  Closed displaced fracture of acromial process of left scapula with malunion, subsequent encounter  Surgery Dr. Roland Rack 12/21/19   HM utd Had flu 2021, prevnar, pna 23utd allergic to Tdap declines shingrix covid 19 vaccine has not had due to allergies  mammo3/24/2020 repeat normalreferred againwill need to be scheduleas of 12/06/19 wants  to wait due to left arm surgery  Colonoscopy 04/02/16 multiple polyps but tortuous colon GI does not want to do any further   Out of age window pap   DEXA 08/19/16 +osteoporosis on Forteo will need to repeat 1 year after Forteo use to see improvement [facility]. -DEXA 10/12/17 with T score -4.1 worse on forteounable to do any further bone densitiesper pt 07/07/2018 due to h/o multiple ortho surgeries +Osteoporosis  Off forteo was on x 2 years [facility] Dr. Jefm Bryant reclast upcoming 2021   [place] [facility] [place] [facility] to get closer to homeand due for f/u 05/2019 will encourage pt to call To schedule appt  Dr. Kellie Moor 11/2019  Dr. Rosine Door Retinal specialist 08/2017, wasscheduled 12/2018, 04/24/19, 05/22/19 injection 09/2019 Q 8 weeks  Consider h/o in future with persistent leukocytosis  -refer at f/ulast CBC WBC normal 07/2019 care everywhere  Echo 11/22/18 [facility] INTERPRETATION   NORMAL LEFT VENTRICULAR SYSTOLIC FUNCTION WITH MILD LVH   NORMAL RIGHT VENTRICULAR SYSTOLIC FUNCTION   VALVULAR REGURGITATION: MODERATE AR, MODERATE MR,TRIVIAL TR   NO VALVULAR STENOSIS   repeat ech 10/06/19 echo [facility]   NORMAL LEFT VENTRICULAR SYSTOLIC FUNCTION   NORMAL RIGHT VENTRICULAR SYSTOLIC FUNCTION   VALVULAR REGURGITATION: MILD AR, MILD MR, TRIVIAL TR   NO VALVULAR STENOSIS   AORTIC SCLEROSIS   LIMITED ACOUSTIC WINDOWS   Provider: Dr. Olivia Mackie McLean-Scocuzza-Internal Medicine

## 2019-12-07 ENCOUNTER — Telehealth: Payer: Self-pay

## 2019-12-07 DIAGNOSIS — I2510 Atherosclerotic heart disease of native coronary artery without angina pectoris: Secondary | ICD-10-CM | POA: Diagnosis not present

## 2019-12-07 DIAGNOSIS — I130 Hypertensive heart and chronic kidney disease with heart failure and stage 1 through stage 4 chronic kidney disease, or unspecified chronic kidney disease: Secondary | ICD-10-CM | POA: Diagnosis not present

## 2019-12-07 DIAGNOSIS — J449 Chronic obstructive pulmonary disease, unspecified: Secondary | ICD-10-CM | POA: Diagnosis not present

## 2019-12-07 DIAGNOSIS — E1122 Type 2 diabetes mellitus with diabetic chronic kidney disease: Secondary | ICD-10-CM | POA: Diagnosis not present

## 2019-12-07 DIAGNOSIS — I509 Heart failure, unspecified: Secondary | ICD-10-CM | POA: Diagnosis not present

## 2019-12-07 DIAGNOSIS — N183 Chronic kidney disease, stage 3 unspecified: Secondary | ICD-10-CM | POA: Diagnosis not present

## 2019-12-07 DIAGNOSIS — I350 Nonrheumatic aortic (valve) stenosis: Secondary | ICD-10-CM | POA: Diagnosis not present

## 2019-12-07 DIAGNOSIS — T84018D Broken internal joint prosthesis, other site, subsequent encounter: Secondary | ICD-10-CM | POA: Diagnosis not present

## 2019-12-07 DIAGNOSIS — E1142 Type 2 diabetes mellitus with diabetic polyneuropathy: Secondary | ICD-10-CM | POA: Diagnosis not present

## 2019-12-07 LAB — URINALYSIS, ROUTINE W REFLEX MICROSCOPIC
Bacteria, UA: NONE SEEN /HPF
Bilirubin Urine: NEGATIVE
Glucose, UA: NEGATIVE
Hgb urine dipstick: NEGATIVE
Hyaline Cast: NONE SEEN /LPF
Ketones, ur: NEGATIVE
Nitrite: NEGATIVE
Protein, ur: NEGATIVE
RBC / HPF: NONE SEEN /HPF (ref 0–2)
Specific Gravity, Urine: 1.017 (ref 1.001–1.03)
WBC, UA: NONE SEEN /HPF (ref 0–5)
pH: 6.5 (ref 5.0–8.0)

## 2019-12-07 NOTE — Telephone Encounter (Signed)
Pt returned your call about results °

## 2019-12-08 ENCOUNTER — Ambulatory Visit: Payer: Medicare PPO | Admitting: Internal Medicine

## 2019-12-08 DIAGNOSIS — J8489 Other specified interstitial pulmonary diseases: Secondary | ICD-10-CM

## 2019-12-08 HISTORY — DX: Other specified interstitial pulmonary diseases: J84.89

## 2019-12-08 MED ORDER — SUCRALFATE 1 G PO TABS: 1.0000 g | ORAL_TABLET | Freq: Two times a day (BID) | ORAL | 3 refills | Status: DC

## 2019-12-08 MED ORDER — NEBIVOLOL HCL 10 MG PO TABS: 5.0000 mg | ORAL_TABLET | Freq: Two times a day (BID) | ORAL | Status: DC

## 2019-12-08 MED ORDER — GABAPENTIN 300 MG PO CAPS: 600.0000 mg | ORAL_CAPSULE | Freq: Two times a day (BID) | ORAL | Status: DC

## 2019-12-08 MED ORDER — FLUTICASONE PROPIONATE 50 MCG/ACT NA SUSP: 2.0000 | Freq: Every day | NASAL | Status: DC

## 2019-12-09 ENCOUNTER — Other Ambulatory Visit: Payer: Self-pay | Admitting: Urology

## 2019-12-13 ENCOUNTER — Inpatient Hospital Stay: Admission: RE | Admit: 2019-12-13 | Payer: Medicare PPO | Source: Ambulatory Visit

## 2019-12-14 ENCOUNTER — Other Ambulatory Visit: Payer: Self-pay

## 2019-12-14 ENCOUNTER — Other Ambulatory Visit
Admission: RE | Admit: 2019-12-14 | Discharge: 2019-12-14 | Disposition: A | Discharge status: Home / Self Care | Payer: Medicare PPO | Source: Ambulatory Visit | Attending: Surgery | Admitting: Surgery

## 2019-12-14 DIAGNOSIS — Z01818 Encounter for other preprocedural examination: Secondary | ICD-10-CM | POA: Insufficient documentation

## 2019-12-14 IMAGING — CR DG SHOULDER 2+V*L*
1 series · 3 of 3 positions shown · non-contrast
Comparison: Medial postoperative radiograph 07/26/2018

CLINICAL DATA: Left shoulder pain. Heard a pop, severe pain since
that time. Prior reverse left shoulder arthroplasty.

EXAM:
LEFT SHOULDER - 2+ VIEW

[Series 1: dg shoulder left · 0.14mm/px · 3 of 3 slices shown]
[im 1/3]
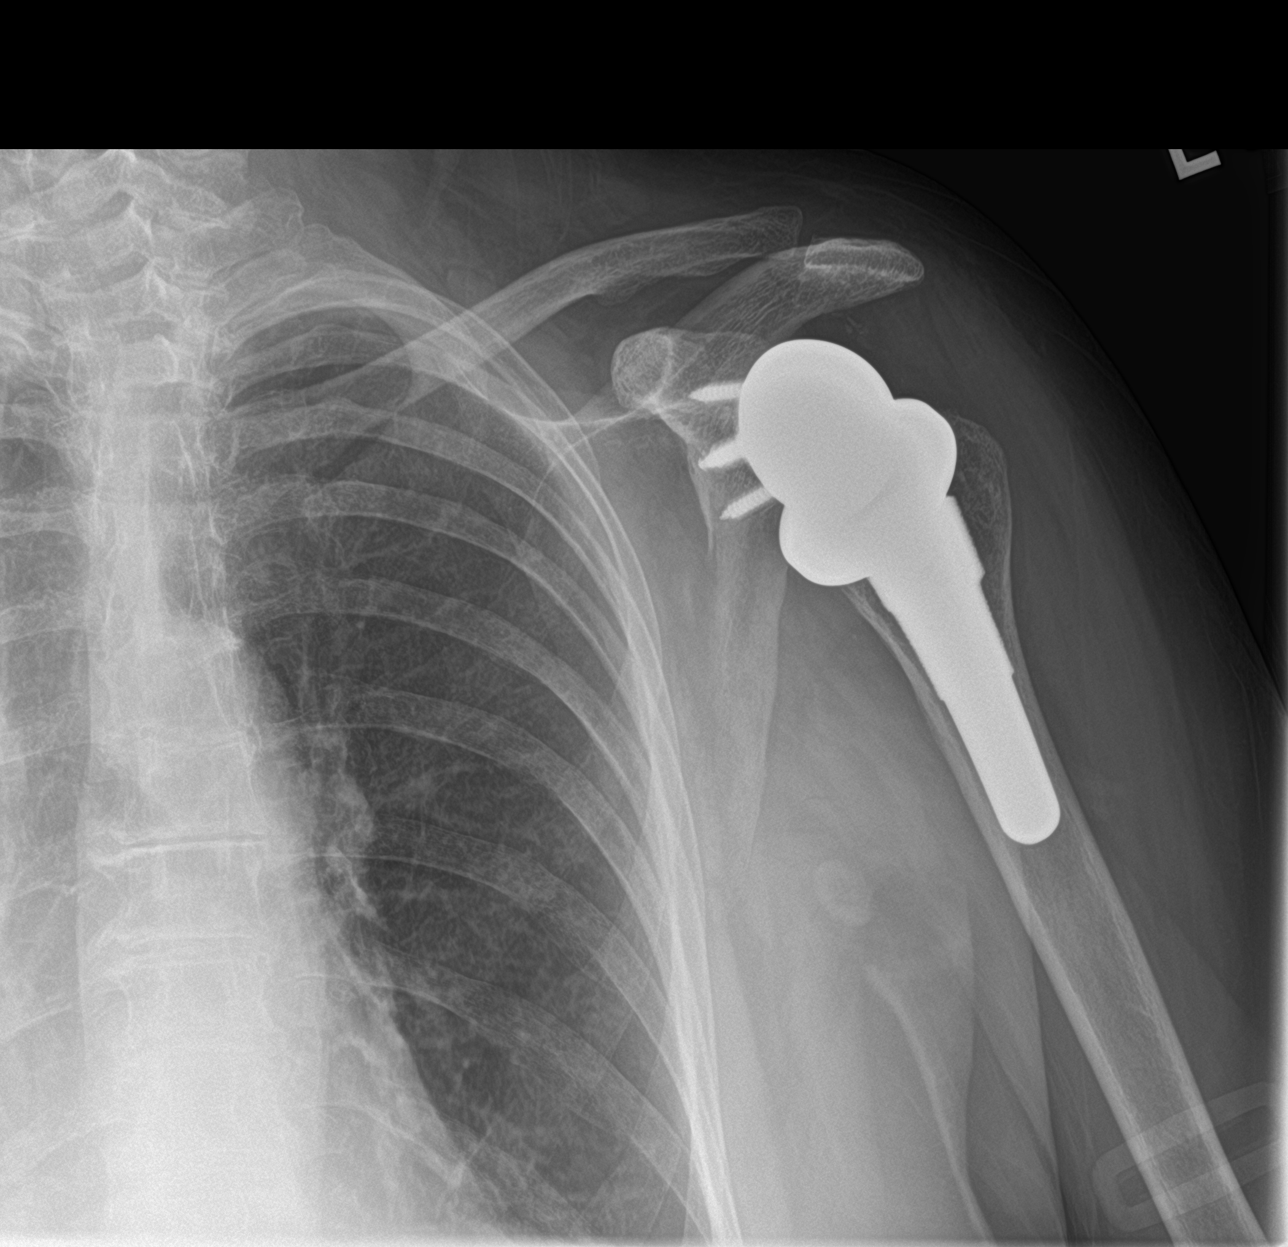
[im 2/3]
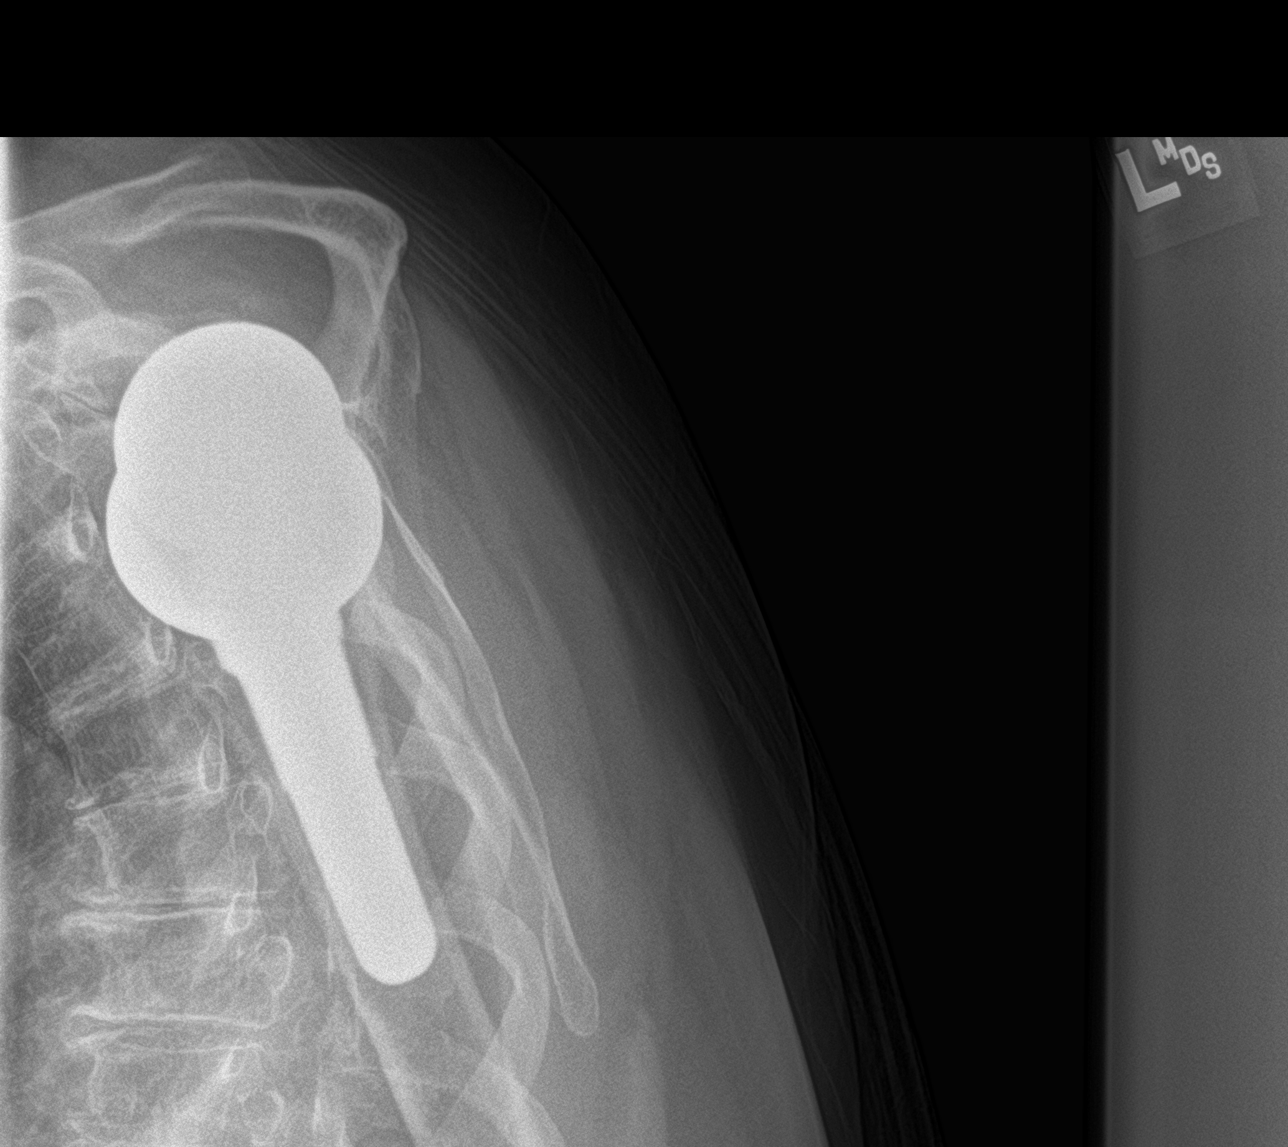
[im 3/3]
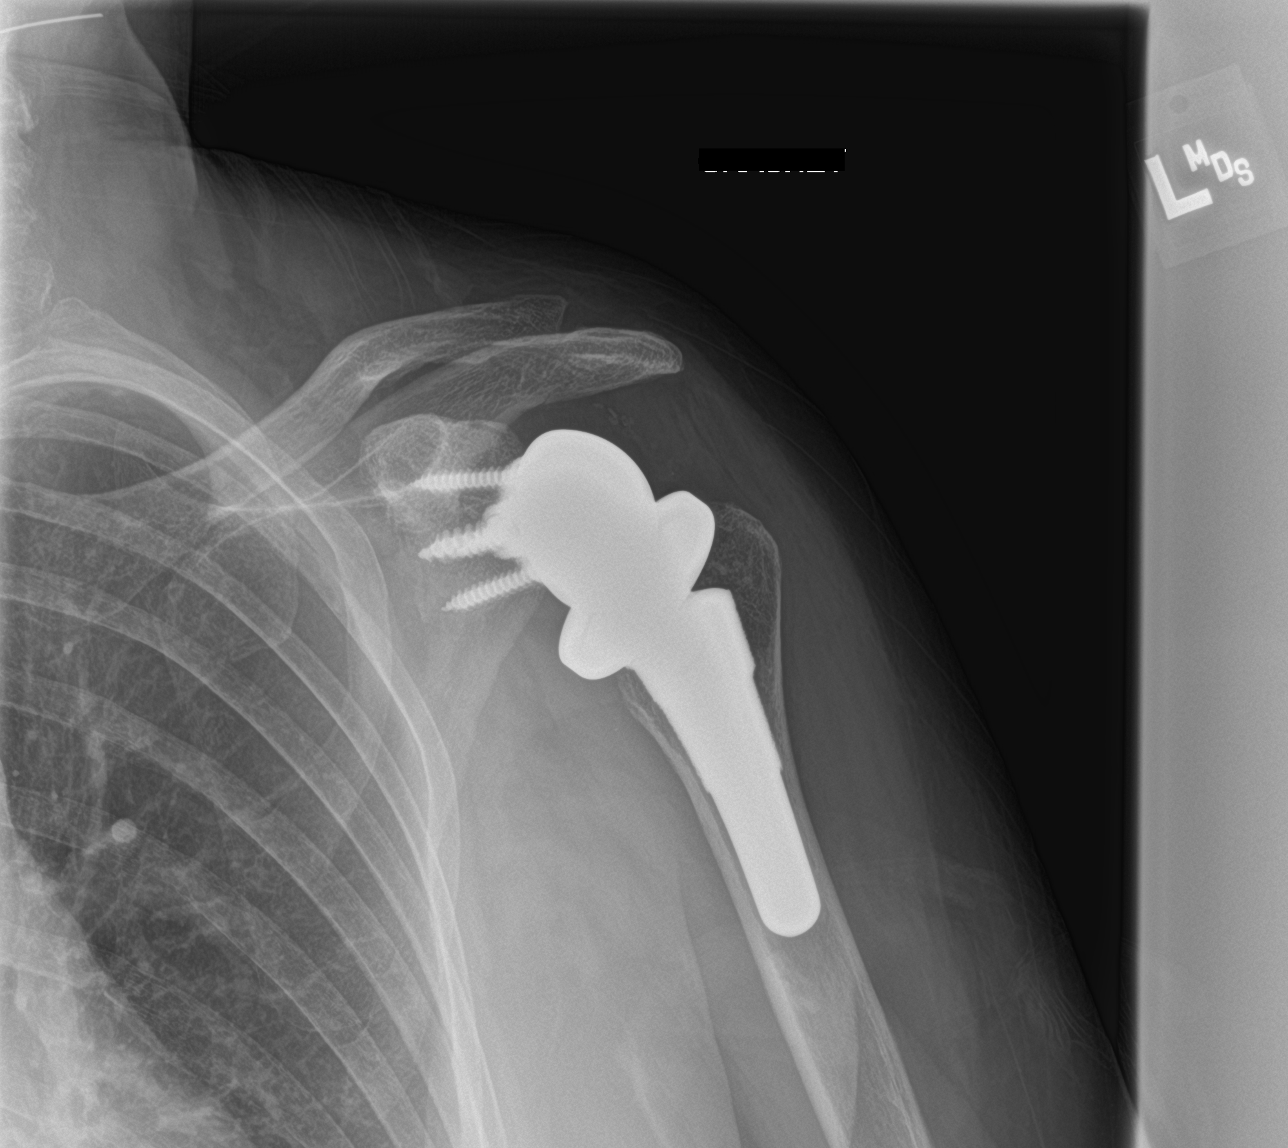

[3 of 3 positions shown; findings below may reference images not displayed]

FINDINGS: Reverse left shoulder arthroplasty in expected alignment. No
periprosthetic lucency or fracture. Acromioclavicular joint is
congruent. No focal soft tissue abnormality.
IMPRESSION: Reverse left shoulder arthroplasty in expected alignment without
complications. No acute or periprosthetic fracture.

## 2019-12-14 NOTE — Patient Instructions (Addendum)
Your procedure is scheduled on: Thurs 11/18 Report to [facility] mall registration desk    Then to surgery desk on 2nd floor To find out your arrival time please call [phone] between [place] on Wed 11/17.  Remember: Instructions that are not followed completely may result in serious medical risk,  up to and including death, or upon the discretion of your surgeon and anesthesiologist your  surgery may need to be rescheduled.     _X__ 1. Do not eat food after midnight the night before your procedure.                 No chewing gum or hard candies. You may drink clear liquids up to 2 hours                 before you are scheduled to arrive for your surgery- DO not drink clear                 liquids within 2 hours of the start of your surgery.                 Clear Liquids include:  water, apple juice without pulp, clear Gatorade, G2 or                  Gatorade Zero (avoid Red/Purple/Blue), Black Coffee or Tea (Do not add                 anything to coffee or tea). __x___2.   Complete the "Ensure Clear Pre-surgery Clear Carbohydrate Drink" provided to you, 2 hours before arrival. **If you       are diabetic you will be provided with an alternative drink, Gatorade Zero or G2.  __X__2.  On the morning of surgery brush your teeth with toothpaste and water, you                may rinse your mouth with mouthwash if you wish.  Do not swallow any toothpaste of mouthwash.     ___ 3.  No Alcohol for 24 hours before or after surgery.   ___ 4.  Do Not Smoke or use e-cigarettes For 24 Hours Prior to Your Surgery.                 Do not use any chewable tobacco products for at least 6 hours prior to                 Surgery.  ___  5.  Do not use any recreational drugs (marijuana, cocaine, heroin, ecstasy, MDMA or other)                For at least one week prior to your surgery.  Combination of these drugs with anesthesia                May have life threatening  results.  ____  6.  Bring all medications with you on the day of surgery if instructed.   __x__  7.  Notify your doctor if there is any change in your medical condition      (cold, fever, infections).     Do not wear jewelry, make-up, hairpins, clips or nail polish. Do not wear lotions, powders, or perfumes. . Do not shave 48 hours prior to surgery.  Do not bring valuables to the hospital.    [facility] is not responsible for any belongings or valuables.  Contacts, dentures or bridgework may not be worn  into surgery. Leave your suitcase in the car. After surgery it may be brought to your room. For patients admitted to the hospital, discharge time is determined by your treatment team.   Patients discharged the day of surgery will not be allowed to drive home.   Make arrangements for someone to be with you for the first 24 hours of your Same Day Discharge.    Please read over the following fact sheets that you were given:   Incentive spirometer    __x__ Take these medicines the morning of surgery with A SIP OF WATER:    1.amLODipine (NORVASC) 2.5 MG tablet  2. gabapentin (NEURONTIN) 300 MG capsule  3. dicyclomine (BENTYL) 10 MG capsule  4.escitalopram (LEXAPRO) 10 MG tablet  5.famotidine (PEPCID) 20 MG tablet  6.fluticasone (FLONASE) 50 MCG/ACT nasal spray              7.lamoTRIgine (LAMICTAL) 100 MG tabletmontelukast (SINGULAIR) 10 MG tablet              8.nebivolol (BYSTOLIC) 10 MG tablet              9.pantoprazole (PROTONIX) 40 MG tablet ____ Fleet Enema (as directed)   __x__ Use CHG Soap (or wipes) as directed  ____ Use Benzoyl Peroxide Gel as instructed  __x__ Use inhalers on the day of surgery albuterol (VENTOLIN HFA) 108 (90 Base) MCG/ACT inhaler and bring with you, budesonide (PULMICORT) 0.25 MG/2ML nebulizer solution, ipratropium-albuterol (DUONEB) 0.5-2.5 (3) MG/3ML SOLN  ____ Stop metformin 2 days prior to surgery    ____ Take 1/2 of usual insulin dose the  night before surgery. No insulin the morning          of surgery.   ____ Stop Coumadin/Plavix/aspirin on  ____ Stop Anti-inflammatories on    _x___ Stop supplements until after surgery.  Biotin, Melatonin 10 MG CAPS               May continue other vitamins   ____ Bring C-Pap to the hospital.    If you have any questions regarding your pre-procedure instructions,  Please call Pre-admit Testing at [place]

## 2019-12-18 ENCOUNTER — Encounter: Payer: Self-pay | Admitting: Surgery

## 2019-12-18 NOTE — Progress Notes (Signed)
[facility] Perioperative Services  Pre-Admission/Anesthesia Testing Clinical Review  Date: 12/19/19  Patient Demographics:  Name: Kerri Carter DOB:   [birth year] MRN:   [id number]  Planned Surgical Procedure(s):    Case: [id number] Date/Time: 12/21/19 0715   Procedure: OPEN REDUCTION INTERNAL FIXATION (ORIF) OF LEFT SCAPULAR NONUNION WITH POSSIBLE BONE GRAFT FROM LEFT ILIAC CREST (Left Shoulder)   Anesthesia type: Choice   Pre-op diagnosis: Closed displaced fracture of acromial process of left scapula with nonunion S42.122K   Location: [facility] OR ROOM 03 / [place] [facility] FOR ANESTHESIA GROUP   Surgeons: Corky Mull, MD     NOTE: Available PAT nursing documentation and vital signs have been reviewed. Clinical nursing staff has updated patient's PMH/PSHx, current medication list, and drug allergies/intolerances to ensure comprehensive history available to assist in medical decision making as it pertains to the aforementioned surgical procedure and anticipated anesthetic course.   Clinical Discussion:  Kerri Carter is a 79 y.o. female who is submitted for pre-surgical anesthesia review and clearance prior to her undergoing the above procedure. Patient is a Former Smoker (10 pack years; quit 02/1974). Pertinent PMH includes: CAD, valvular heart disease, HTN, HLD, PAD, COPD, asthma, OSAH (no nocturnal PAP therapy), mild ILD, CKD-III (s/p RIGHT nephrectomy), GERD (on daily H2 blocker and PPI), PUD (on daily anti-ulcer), OA, RA, conversion disorder, bipolar disorder, anxiety (on BZO), depression, lumbar spinal stenosis  Patient is followed by cardiology (Povsic, PA-C). She was last seen in the cardiology clinic on 10/06/2019; notes reviewed.  At the time of her clinic visit, patient reported that she was doing "fairly well".  Patient denied angina, PND, orthopnea, palpitations, peripheral edema, vertiginous symptoms, and presyncope/syncope.  Systolic blood pressure running between the  110s and 130s at home on currently prescribed CCB and beta-blocker regimen.  Patient is on a statin for her HLD.  TTE performed that day revealed normal left ventricular systolic function with trivial to mild valvular insufficiency; LVEF >55% (see full interpretation of cardiovascular testing below).  Patient denies any recent changes to her medical history.  No changes were made to her medication regimen.  Patient scheduled to follow-up with outpatient cardiology in 6 months.  Additionally, patient is followed by pulmonary medicine Patsey Berthold, MD).  She was last seen in the pulmonology clinic on 12/19/2019; notes reviewed.  At the time of her clinic visit appeared well and advised physician that she felt well.  Patient was seen in October 2021 at which time she complained of cough.  Patient with asthma/COPD overlap syndrome with mild ILS related to her RA diagnosis.  September CXR reviewed and revealed no acute changes.  Patient was empirically treated with  amoxicillin-clavulanate and prednisone.  Patient noted that symptoms improved, however when systemic steroid course completed symptoms returned and became worse again.  Patient continues on prescribed Duonebs and budesonide SVNs.  Patient was then treated with prednisone taper, and has remained on 10 mg prednisone daily.  Patient reporting that she is "symptom-free, and this intervention is "working really well for her"; dyspnea and coughing have been controlled.  Patient with recent ON of that demonstrates nocturnal hypoxemia; nocturnal supplemental oxygen ordered.  Exam revealed coarse breath sounds throughout with bibasilar faint dry crackles (L>R).  Patient denies any acute changes to her medical history.  Systemic steroid dose decreased to 7.5 mg daily.  Patient scheduled follow-up with pulmonary medicine in 3 months.  Patient is scheduled to undergo the above elective orthopedic procedure on 12/21/2019 with Dr. Jenny Reichmann  Poggi.  Given her past medical  history significant for cardiopulmonary issues, presurgical cardiac and pulmonary clearance was sought by the PAT team.  Specialty clearances were issued as follows:   Per cardiology, "this patient is optimized for surgery and may proceed with the planned procedural course with a LOW risk stratification". This patient is not on daily anticoagulation therapy.    Per pulmonary medicine, "from an anesthesia standpoint this patient is at MODERATE risk, however this risk cannot be further reduced.  She is as compensated as she is to be".  Pulmonologist notes that patient has issues with nocturnal hypoxemia and recommends this be taken into account postoperatively as narcotics/sedatives may worsen her hypoxemia.  She denies previous perioperative complications with anesthesia. She underwent a general anesthetic course here (ASA III) in 07/2018 with no documented complications.  Patient with history of lumbar stenosis and subsequent fusion.  Results from most recent imaging included below for review by the anesthesia team in the event that neuraxial anesthetic course is considered as part of this patient's plan of care.  Vitals with BMI 12/19/2019 12/14/2019 12/06/2019  Height 5' 1.5" 5' 1.5" 5' 1.5"  Weight 166 lbs 164 lbs 14 oz 165 lbs  BMI 30.86 31.51 76.16  Systolic 073 - 710  Diastolic 78 - 68  Pulse 67 - 73  Some encounter information is confidential and restricted. Go to Review Flowsheets activity to see all data.    Providers/Specialists:   NOTE: Primary physician provider listed below. Patient may have been seen by APP or partner within same practice.   PROVIDER ROLE LAST OV  Poggi, Marshall Cork, MD Orthopedics (Surgeon) 12/14/2019  McLean-Scocuzza, Nino Glow, MD Primary Care Provider 12/06/2019  Vernard Gambles, MD Pulmonology 12/19/2019  Laqueta Jean, PA-C Cardiology 10/06/2019   Allergies:  Ceftin [cefuroxime axetil]; Lisinopril; Sulfa antibiotics; Sulfasalazine; Morphine; Xarelto  [rivaroxaban]; Adhesive [tape]; Antihistamines, chlorpheniramine-type; Antivert [meclizine hcl]; Aspirin; Contrast media [iodinated diagnostic agents]; Decongestant [pseudoephedrine hcl]; Doxycycline; Levaquin [levofloxacin in d5w]; Polymyxin b; and Tetanus toxoids  Current Home Medications:   No current facility-administered medications for this encounter.   . Adalimumab (HUMIRA PEN) 40 MG/0.4ML PNKT  . albuterol (VENTOLIN HFA) 108 (90 Base) MCG/ACT inhaler  . ALPRAZolam (XANAX) 0.25 MG tablet  . amLODipine (NORVASC) 2.5 MG tablet  . benzonatate (TESSALON) 200 MG capsule  . Biotin 10000 MCG TABS  . budesonide (PULMICORT) 0.25 MG/2ML nebulizer solution  . Cholecalciferol 50 MCG (2000 UT) CAPS  . dicyclomine (BENTYL) 10 MG capsule  . escitalopram (LEXAPRO) 10 MG tablet  . famotidine (PEPCID) 20 MG tablet  . ipratropium-albuterol (DUONEB) 0.5-2.5 (3) MG/3ML SOLN  . lamoTRIgine (LAMICTAL) 100 MG tablet  . leflunomide (ARAVA) 20 MG tablet  . lovastatin (MEVACOR) 20 MG tablet  . montelukast (SINGULAIR) 10 MG tablet  . multivitamin-lutein (OCUVITE-LUTEIN) CAPS capsule  . pantoprazole (PROTONIX) 40 MG tablet  . QUEtiapine (SEROQUEL) 25 MG tablet  . diphenhydrAMINE HCl, Sleep, (SLEEP AID) 50 MG CAPS  . fluticasone (FLONASE) 50 MCG/ACT nasal spray  . gabapentin (NEURONTIN) 300 MG capsule  . MYRBETRIQ 50 MG TB24 tablet  . nebivolol (BYSTOLIC) 10 MG tablet  . predniSONE (DELTASONE) 5 MG tablet  . sucralfate (CARAFATE) 1 g tablet   History:   Past Medical History:  Diagnosis Date  . Anxiety   . Asthma   . Bipolar disorder ([place])   . C. difficile diarrhea    from Abx 2010 hospitalized   . Cataract    s/p b/l repair   . Chicken  pox   . CKD (chronic kidney disease)   . CKD (chronic kidney disease), stage III ([place])    a. s/p R nephrectomy./ aneurysm  . Conversion disorder   . COPD (chronic obstructive pulmonary disease) ([place])   . Depression   . Essential hypertension   . GERD  (gastroesophageal reflux disease)   . Hyperlipidemia   . ILD (interstitial lung disease) ([place])    mild; 2/2 RA diagnosis  . Inflammatory arthritis    a. hands/carpal tunnel.  b. Low titer rheumatoid factor. c. Negative anti-CCP antibodies. d. Plaquenil.  . Nocturnal hypoxemia   . Non-Obstructive CAD    a. 07/2009 Cath ([facility]): nonobs dzs;  b. 03/2011 Cath [facility]): nonobs dzs.  . Osteoarthritis    a. Knees.  Marland Kitchen PAD (peripheral artery disease) ([place])   . PUD (peptic ulcer disease)   . S/P right hip fracture    11/01/16 s/p repair  . Shoulder pain   . Sleep apnea    no cpap / minimal  . Spinal stenosis at L4-L5 level    severe with L4/L5 anterolisthesis grade 1 anterolisthesis   . Toxic maculopathy   . Valvular heart disease    a. 07/2015 Echo: EF 55-60%, Mild AI, AS, MR, and TR.   Past Surgical History:  Procedure Laterality Date  . APPENDECTOMY    . BACK SURGERY     lumbar fusion  . BUNIONECTOMY Right   . CATARACT EXTRACTION, BILATERAL    . CESAREAN SECTION     x1  . CHOLECYSTECTOMY N/A 05/11/2016   Procedure: LAPAROSCOPIC CHOLECYSTECTOMY;  Surgeon: Florene Glen, MD;  Location: [facility] ORS;  Service: General;  Laterality: N/A;  . COLONOSCOPY WITH PROPOFOL N/A 04/02/2016   Procedure: COLONOSCOPY WITH PROPOFOL;  Surgeon: Jonathon Bellows, MD;  Location: [facility] ENDOSCOPY;  Service: Endoscopy;  Laterality: N/A;  . ENDOSCOPIC RETROGRADE CHOLANGIOPANCREATOGRAPHY (ERCP) WITH PROPOFOL N/A 05/08/2016   Procedure: ENDOSCOPIC RETROGRADE CHOLANGIOPANCREATOGRAPHY (ERCP) WITH PROPOFOL;  Surgeon: Lucilla Lame, MD;  Location: [facility] ENDOSCOPY;  Service: Endoscopy;  Laterality: N/A;  . ERCP     with biliary spincterotomy 05/08/16 Dr. Allen Norris for choledocholithiasis   . ESOPHAGEAL DILATION  04/02/2016   Procedure: ESOPHAGEAL DILATION;  Surgeon: Jonathon Bellows, MD;  Location: [facility] ENDOSCOPY;  Service: Endoscopy;;  . ESOPHAGOGASTRODUODENOSCOPY (EGD) WITH PROPOFOL N/A 04/02/2016   Procedure: ESOPHAGOGASTRODUODENOSCOPY (EGD) WITH  PROPOFOL;  Surgeon: Jonathon Bellows, MD;  Location: [facility] ENDOSCOPY;  Service: Endoscopy;  Laterality: N/A;  . HIP ARTHROPLASTY Right 11/01/2016   Procedure: ARTHROPLASTY BIPOLAR HIP (HEMIARTHROPLASTY);  Surgeon: Corky Mull, MD;  Location: [facility] ORS;  Service: Orthopedics;  Laterality: Right;  . 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 osteoporosis     noted DEXA 08/19/16   . REPLACEMENT TOTAL KNEE Right   . REVERSE SHOULDER ARTHROPLASTY Right 11/04/2017   Procedure: REVERSE SHOULDER ARTHROPLASTY;  Surgeon: Corky Mull, MD;  Location: [facility] ORS;  Service: Orthopedics;  Laterality: Right;  . REVERSE SHOULDER ARTHROPLASTY Left 07/26/2018   Procedure: REVERSE SHOULDER ARTHROPLASTY;  Surgeon: Corky Mull, MD;  Location: [facility] ORS;  Service: Orthopedics;  Laterality: Left;  . TONSILLECTOMY    . TOTAL HIP ARTHROPLASTY  12/10/11   [facility] left hip  . TOTAL HIP ARTHROPLASTY Bilateral   . TUBAL LIGATION     Family History  Problem Relation Age of Onset  . Rheum arthritis Mother   . Asthma Mother   . Parkinson's disease Mother   . Heart  disease Mother   . Stroke Mother   . Hypertension Mother   . Heart attack Father   . Heart disease Father   . Hypertension Father   . Peripheral Artery Disease Father   . Diabetes Son   . Gout Son   . Asthma Sister   . Heart disease Sister   . Lung cancer Sister   . Heart disease Sister   . Heart disease Sister   . Breast cancer Sister   . Heart attack Sister   . Heart disease Brother   . Heart disease Maternal Grandmother   . Diabetes Maternal Grandmother   . Colon cancer Maternal Grandmother   . Cancer Maternal Grandmother        Hodgkins lymphoma  . Heart disease Brother   . Alcohol abuse Brother   . Depression Brother   . Dementia Son    Social History   Tobacco Use  . Smoking status: Former Smoker    Packs/day: 0.50    Years: 20.00    Pack years: 10.00    Types: Cigarettes    Quit date:  02/02/1974    Years since quitting: 45.9  . Smokeless tobacco: Never Used  Vaping Use  . Vaping Use: Never used  Substance Use Topics  . Alcohol use: No  . Drug use: No    Pertinent Clinical Results:  LABS: Labs reviewed: Acceptable for surgery.       ECG: Date: 12/14/2019 Time ECG obtained: 1205 PM Rate: 65 bpm Rhythm: normal sinus Axis (leads I and aVF): Normal Intervals: PR 152 ms. QRS 86 ms. QTc 395 ms. ST segment and T wave changes: No evidence of acute ST segment elevation or depression Comparison: Similar to previous tracing obtained on 04/14/2018   IMAGING / PROCEDURES: DIAGNOSTIC RADIOGRAPHS OF CHEST done on 10/18/2019 1. The cardiomediastinal silhouette is unchanged in contour.  2. No pleural effusion, pneumothorax, or acute pleuroparenchymal abnormality. 3. Mild bibasilar atelectasis.  4. Atherosclerotic calcifications.  5. Surgical clips project over the upper abdomen.  6. Remote RIGHT-sided rib fractures.  7. Bilateral shoulder arthroplasty.  8. Partial visualization of lumbar spine orthopedic hardware.  ECHOCARDIOGRAM done on 10/06/2019 1. LVEF > 55% 2. Normal LV systolic function 3. Normal RV systolic function 4. Mild AR, MR; trivial TR; no PR 5. No valvular stenosis 6. Aortic sclerosis 7. Limited acoustic windows  CT SHOULDER LEFT WO CONTRAST done on 09/04/2019 1. Left acromial stress fracture with 5 mm of diastasis at the fracture site and approximately 7 mm of posterior displacement. Fracture margins are corticated. There are multiple small osseous fragments at the fracture site with prominent soft tissue thickening suggesting pseudoarthrosis. 2. Status post reverse left shoulder arthroplasty without evidence of periprosthetic lucency or periprosthetic fracture 3. There is a round cutaneous/subcutaneous soft tissue density lesion at the midline of the chest wall just above the level of the manubrium measuring 13 mm in diameter, possibly sebaceous or  epidermoid cyst. Correlate with physical exam. 4. Aortic atherosclerosis  CT LUMBAR SPINE WO CONTRAST done on 02/21/2019 1. Mild L4 superior endplate compression fracture, new from 2018 but of indeterminate acuity. 2. Interval L4-5 fusion as above. 3. Progressive L2-3 disc degeneration with increased bilateral lateral recess stenosis and mild-to-moderate spinal stenosis. 4. Chronically advanced disc degeneration at L5-S1 with left greater than right neural foraminal stenosis. 5. Aortic Atherosclerosis   Impression and Plan:  Kerri Carter has been referred for pre-anesthesia review and clearance prior to her undergoing the planned anesthetic and  procedural courses. Available labs, pertinent testing, and imaging results were personally reviewed by me. This patient has been appropriately cleared by cardiology (Povsic, PA-C) and pulmonary medicine Patsey Berthold, MD).   Based on clinical review performed today (12/19/19), barring any significant acute changes in the patient's overall condition, it is anticipated that she will be able to proceed with the planned surgical intervention. Any acute changes in clinical condition may necessitate her procedure being postponed and/or cancelled. Pre-surgical instructions were reviewed with the patient during her PAT appointment and questions were fielded by PAT clinical staff.  Honor Loh, MSN, APRN, FNP-C, CEN [facility]  Peri-operative Services Nurse Practitioner Phone: [phone] 12/19/19 4:27 PM  NOTE: This note has been prepared using Dragon dictation software. Despite my best ability to proofread, there is always the potential that unintentional transcriptional errors may still occur from this process.

## 2019-12-19 ENCOUNTER — Telehealth: Payer: Self-pay | Admitting: Pulmonary Disease

## 2019-12-19 ENCOUNTER — Other Ambulatory Visit: Payer: Self-pay

## 2019-12-19 ENCOUNTER — Ambulatory Visit: Payer: Medicare PPO | Admitting: Pulmonary Disease

## 2019-12-19 ENCOUNTER — Other Ambulatory Visit
Admission: RE | Admit: 2019-12-19 | Discharge: 2019-12-19 | Disposition: A | Discharge status: Home / Self Care | Payer: Medicare PPO | Source: Ambulatory Visit | Attending: Surgery | Admitting: Surgery

## 2019-12-19 ENCOUNTER — Encounter: Payer: Self-pay | Admitting: Pulmonary Disease

## 2019-12-19 ENCOUNTER — Encounter: Payer: Self-pay | Admitting: Surgery

## 2019-12-19 VITALS — BP 130/78 | HR 67 | Temp 97.7°F | Ht 61.5 in | Wt 166.0 lb

## 2019-12-19 DIAGNOSIS — M84312K Stress fracture, left shoulder, subsequent encounter for fracture with nonunion: Secondary | ICD-10-CM | POA: Diagnosis present

## 2019-12-19 DIAGNOSIS — T84018D Broken internal joint prosthesis, other site, subsequent encounter: Secondary | ICD-10-CM | POA: Diagnosis not present

## 2019-12-19 DIAGNOSIS — I130 Hypertensive heart and chronic kidney disease with heart failure and stage 1 through stage 4 chronic kidney disease, or unspecified chronic kidney disease: Secondary | ICD-10-CM | POA: Diagnosis not present

## 2019-12-19 DIAGNOSIS — J849 Interstitial pulmonary disease, unspecified: Secondary | ICD-10-CM | POA: Diagnosis present

## 2019-12-19 DIAGNOSIS — I351 Nonrheumatic aortic (valve) insufficiency: Secondary | ICD-10-CM | POA: Diagnosis present

## 2019-12-19 DIAGNOSIS — N1832 Chronic kidney disease, stage 3b: Secondary | ICD-10-CM | POA: Diagnosis not present

## 2019-12-19 DIAGNOSIS — F32A Depression, unspecified: Secondary | ICD-10-CM | POA: Diagnosis present

## 2019-12-19 DIAGNOSIS — E1122 Type 2 diabetes mellitus with diabetic chronic kidney disease: Secondary | ICD-10-CM | POA: Diagnosis present

## 2019-12-19 DIAGNOSIS — Z20822 Contact with and (suspected) exposure to covid-19: Secondary | ICD-10-CM | POA: Diagnosis present

## 2019-12-19 DIAGNOSIS — E1142 Type 2 diabetes mellitus with diabetic polyneuropathy: Secondary | ICD-10-CM | POA: Diagnosis not present

## 2019-12-19 DIAGNOSIS — J4489 Other specified chronic obstructive pulmonary disease: Secondary | ICD-10-CM

## 2019-12-19 DIAGNOSIS — Z01811 Encounter for preprocedural respiratory examination: Secondary | ICD-10-CM | POA: Diagnosis not present

## 2019-12-19 DIAGNOSIS — E785 Hyperlipidemia, unspecified: Secondary | ICD-10-CM | POA: Diagnosis present

## 2019-12-19 DIAGNOSIS — M0510 Rheumatoid lung disease with rheumatoid arthritis of unspecified site: Secondary | ICD-10-CM

## 2019-12-19 DIAGNOSIS — E039 Hypothyroidism, unspecified: Secondary | ICD-10-CM | POA: Diagnosis present

## 2019-12-19 DIAGNOSIS — I2510 Atherosclerotic heart disease of native coronary artery without angina pectoris: Secondary | ICD-10-CM | POA: Diagnosis present

## 2019-12-19 DIAGNOSIS — I129 Hypertensive chronic kidney disease with stage 1 through stage 4 chronic kidney disease, or unspecified chronic kidney disease: Secondary | ICD-10-CM | POA: Diagnosis present

## 2019-12-19 DIAGNOSIS — J449 Chronic obstructive pulmonary disease, unspecified: Secondary | ICD-10-CM

## 2019-12-19 DIAGNOSIS — K219 Gastro-esophageal reflux disease without esophagitis: Secondary | ICD-10-CM | POA: Diagnosis present

## 2019-12-19 DIAGNOSIS — F449 Dissociative and conversion disorder, unspecified: Secondary | ICD-10-CM | POA: Diagnosis present

## 2019-12-19 DIAGNOSIS — I701 Atherosclerosis of renal artery: Secondary | ICD-10-CM | POA: Diagnosis present

## 2019-12-19 DIAGNOSIS — R053 Chronic cough: Secondary | ICD-10-CM | POA: Diagnosis not present

## 2019-12-19 DIAGNOSIS — E1151 Type 2 diabetes mellitus with diabetic peripheral angiopathy without gangrene: Secondary | ICD-10-CM | POA: Diagnosis present

## 2019-12-19 DIAGNOSIS — Q613 Polycystic kidney, unspecified: Secondary | ICD-10-CM | POA: Diagnosis not present

## 2019-12-19 DIAGNOSIS — Z96611 Presence of right artificial shoulder joint: Secondary | ICD-10-CM | POA: Diagnosis present

## 2019-12-19 DIAGNOSIS — Z01812 Encounter for preprocedural laboratory examination: Secondary | ICD-10-CM | POA: Insufficient documentation

## 2019-12-19 DIAGNOSIS — F419 Anxiety disorder, unspecified: Secondary | ICD-10-CM | POA: Diagnosis present

## 2019-12-19 DIAGNOSIS — M8438XK Stress fracture, other site, subsequent encounter for fracture with nonunion: Secondary | ICD-10-CM | POA: Diagnosis present

## 2019-12-19 DIAGNOSIS — G4736 Sleep related hypoventilation in conditions classified elsewhere: Secondary | ICD-10-CM | POA: Diagnosis not present

## 2019-12-19 DIAGNOSIS — I350 Nonrheumatic aortic (valve) stenosis: Secondary | ICD-10-CM | POA: Diagnosis not present

## 2019-12-19 DIAGNOSIS — G4733 Obstructive sleep apnea (adult) (pediatric): Secondary | ICD-10-CM | POA: Diagnosis present

## 2019-12-19 DIAGNOSIS — M810 Age-related osteoporosis without current pathological fracture: Secondary | ICD-10-CM | POA: Diagnosis present

## 2019-12-19 DIAGNOSIS — M1990 Unspecified osteoarthritis, unspecified site: Secondary | ICD-10-CM | POA: Diagnosis present

## 2019-12-19 DIAGNOSIS — S42102A Fracture of unspecified part of scapula, left shoulder, initial encounter for closed fracture: Secondary | ICD-10-CM | POA: Diagnosis not present

## 2019-12-19 DIAGNOSIS — N183 Chronic kidney disease, stage 3 unspecified: Secondary | ICD-10-CM | POA: Diagnosis present

## 2019-12-19 DIAGNOSIS — Z981 Arthrodesis status: Secondary | ICD-10-CM | POA: Diagnosis not present

## 2019-12-19 DIAGNOSIS — S42122K Displaced fracture of acromial process, left shoulder, subsequent encounter for fracture with nonunion: Secondary | ICD-10-CM | POA: Diagnosis not present

## 2019-12-19 DIAGNOSIS — I509 Heart failure, unspecified: Secondary | ICD-10-CM | POA: Diagnosis not present

## 2019-12-19 DIAGNOSIS — M069 Rheumatoid arthritis, unspecified: Secondary | ICD-10-CM | POA: Diagnosis present

## 2019-12-19 MED ORDER — PREDNISONE 5 MG PO TABS
7.5000 mg | ORAL_TABLET | Freq: Every day | ORAL | 2 refills | Status: DC
Start: 2019-12-19 — End: 2020-04-01

## 2019-12-19 NOTE — Telephone Encounter (Signed)
ONO has been reviewed by Dr. Patsey Berthold-- recommend 2L QHS. Patient is aware of results and voiced her understanding. Order has been placed for 2L, as she is agreeable.  Nothing further needed.

## 2019-12-19 NOTE — Progress Notes (Signed)
Subjective:    Patient ID: Kerri Carter, female    DOB: 1940-10-18, 79 y.o.   MRN: [id number]  HPI  Kerri Carter presents as a follow-up from her visit of 21 November 2019.  Recall that she was seen for increasing cough which relapsed around September.  She has issues with asthma/COPD overlap syndrome and mild interstitial lung disease related to rheumatoid arthritis.  During her visit in September she was given a prescription of Augmentin and prednisone taper.  She states that while she was on the prednisone taper she did very well but once the taper ran out the cough became worse again.  She has been using her DuoNeb and Pulmicort nebulizers as previously instructed (she had not been doing so on her September visit).  We then saw her in October and at that time she felt better overall but continued to be plagued by her cough.  Chest x-ray performed on her visit in September was without new acute change.  In October we then gave her a prednisone taper and told her to stay on 10 mg of prednisone daily.  This has been working really well for her and she is now symptom-free.  Her dyspnea and coughing have been controlled.  I suspect she probably had a flare of her interstitial lung disease due to rheumatoid arthritis or side effect of the Arava/Humira combination.  He has not had any fevers, chills or sweats.  Cough has totally resolved.  She voices no other complaint.  She has had an overnight oximetry that does show that she does require oxygen nocturnally this will be ordered for her.   She has upcoming surgery on the 18th (Thursday) due to a closed displaced fracture of the acromial process on the left scapula with malunion.  This will need to be repaired.  As her October visit she feels now "back to normal".  She looks well and feels well.  Review of Systems A 10 point review of systems was performed and it is as noted above otherwise negative.  . Patient Active Problem List   Diagnosis Date Noted    . NSIP (nonspecific interstitial pneumonitis) ([place]) 12/08/2019  . Iliopsoas bursitis of right hip 08/29/2019  . Chronic left shoulder pain 08/29/2019  . Overweight (BMI 25.0-29.9) 08/29/2019  . Hypertriglyceridemia 07/24/2019  . Bipolar disorder, in full remission, most recent episode depressed ([place]) 07/13/2019  . Essential hypertension 06/16/2019  . Chronic pain 06/16/2019  . Lymphedema 06/05/2019  . Chronic venous insufficiency 05/25/2019  . PAD (peripheral artery disease) ([place]) 05/25/2019  . Leg edema 05/02/2019  . Episodic mood disorder ([place]) 04/18/2019  . Complicated grief 07/37/1062  . At high risk for falls 03/16/2019  . Pelvic mass in female 03/16/2019  . Compression fracture of L4 vertebra (HCC) 02/21/2019  . Collapsed vertebra, not elsewhere classified, lumbar region, initial encounter for fracture ([place]) 02/21/2019  . Anxiety 01/20/2019  . Pain due to onychomycosis of toenails of both feet 12/05/2018  . Hav (hallux abducto valgus), unspecified laterality 12/05/2018  . Closed fracture of acromial process of scapula 10/28/2018  . Status post reverse arthroplasty of shoulder, left 07/26/2018  . Interstitial pulmonary disease ([place]) 07/07/2018  . Fatigue 07/07/2018  . Avascular necrosis of left shoulder due to adverse effect of steroid therapy ([place]) 01/20/2018  . Other shoulder lesions, left shoulder 12/20/2017  . Status post reverse total shoulder replacement, right 11/04/2017  . Leukocytosis 10/19/2017  . Allergic rhinitis 09/28/2017  . Rotator cuff tendinitis, right 07/26/2017  .  Strain of right hip 02/26/2017  . History of fracture of left hip 01/15/2017  . Status post lumbar spinal fusion 01/15/2017  . Dislocation of hip joint prosthesis ([place]) 01/08/2017  . Status post hip hemiarthroplasty 01/08/2017  . Leg swelling 12/15/2016  . Age-related osteoporosis without current pathological fracture 11/05/2016  . Hip fracture ([place]) 10/31/2016  . Polyneuropathy 10/28/2016   . Left foot pain 10/19/2016  . Spinal stenosis of lumbar region 09/15/2016  . Osteoporosis 08/27/2016  . Drug-induced osteoporosis 08/27/2016  . Adnexal mass 08/25/2016  . Prediabetes 08/25/2016  . Coronary atherosclerosis 08/25/2016  . History of syncope 08/25/2016  . Hypokalemia 08/25/2016  . Mixed bipolar I disorder ([place]) 10/09/2015  . Stage 3b chronic kidney disease ([place])   . Conversion disorder 08/04/2015  . Hyperlipidemia 07/17/2015  . 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 Macular degeneration 07/17/2015  . Insomnia 07/17/2015  . Rheumatoid arthritis ([place]) 12/05/2014  . Other specified postprocedural states 07/20/2013  . Anxiety and depression 06/13/2013  . Osteoarthritis of knee 06/07/2013  . Cough 05/02/2012  . Valvular heart disease 10/13/2011  . Asthma-COPD overlap syndrome ([place]) 09/09/2011  . OSA (obstructive sleep apnea) 09/09/2011  . Nocturnal hypoxemia 09/09/2011  . Gastroesophageal reflux disease 02/24/2011  . Single kidney 02/24/2011  . H/O cardiac catheterization 02/24/2011  . Renal artery stenosis (HCC) 02/24/2011   Allergies  Allergen Reactions  . Ceftin [Cefuroxime Axetil] Anaphylaxis  . Lisinopril Anaphylaxis  . Sulfa Antibiotics Other (See Comments)    Face swelling  . Sulfasalazine Anaphylaxis  . Morphine Other (See Comments)    Per patient, low blood pressure issues that requires action to raise it back up. Can take small infrequent doses  . Xarelto [Rivaroxaban] Other (See Comments)    Stomach burning, bleeding, and tar in stool  . Adhesive [Tape] Rash    Paper tape and tega derm OK  . Antihistamines, Chlorpheniramine-Type Other (See Comments)    Makes pt hyper  . Antivert [Meclizine Hcl] Other (See Comments)    Bladder will not empty  . Aspirin Other (See Comments)    Sulfasalazine allergy cross reacts  . Contrast Media [Iodinated Diagnostic Agents] Rash    she is able to use betadine scrubs.  [place]  [Pseudoephedrine Hcl] Other (See Comments)    Makes pt hyper  . Doxycycline Other (See Comments)    GI upset  . Levaquin [Levofloxacin In D5w] Rash  . Polymyxin B Other (See Comments)    Facial rash  . Tetanus Toxoids Rash and Other (See Comments)    Fever and hot to touch at injection site   Current Meds  Medication Sig  . Adalimumab (HUMIRA PEN) 40 MG/0.4ML PNKT Inject 40 mg into the skin every 14 (fourteen) days.   Marland Kitchen albuterol (VENTOLIN HFA) 108 (90 Base) MCG/ACT inhaler Inhale 2 puffs into the lungs every 6 (six) hours as needed. (Patient taking differently: Inhale 2 puffs into the lungs every 6 (six) hours as needed for wheezing or shortness of breath. )  . ALPRAZolam (XANAX) 0.25 MG tablet Take 1 tablet (0.25 mg total) by mouth at bedtime as needed for anxiety. (Patient taking differently: Take 0.25 mg by mouth at bedtime. )  . amLODipine (NORVASC) 2.5 MG tablet Take 2.5 mg by mouth daily.  . benzonatate (TESSALON) 200 MG capsule Take 1 capsule (200 mg total) by mouth 3 (three) times daily as needed for cough.  . Biotin 10000 MCG TABS Take 10,000 mcg by mouth daily.  Marland Kitchen  budesonide (PULMICORT) 0.25 MG/2ML nebulizer solution USE 1 VIAL  IN  NEBULIZER TWICE  DAILY - rinse mouth after treatment (Patient taking differently: Take 0.25 mg by nebulization daily. )  . Cholecalciferol 50 MCG (2000 UT) CAPS Take 4,000 Units by mouth daily.   Marland Kitchen dicyclomine (BENTYL) 10 MG capsule TAKE 1 CAPSULE (10 MG TOTAL) BY MOUTH 4 (FOUR) TIMES DAILY - BEFORE MEALS AND AT BEDTIME. (Patient taking differently: Take 20 mg by mouth in the morning and at bedtime. )  . diphenhydrAMINE HCl, Sleep, (SLEEP AID) 50 MG CAPS Take by mouth.  . escitalopram (LEXAPRO) 10 MG tablet Take 1 tablet (10 mg total) by mouth daily.  . famotidine (PEPCID) 20 MG tablet Take 1 tablet (20 mg total) by mouth daily. Before breakfast or dinner  . fluticasone (FLONASE) 50 MCG/ACT nasal spray Place 2 sprays into both nostrils daily. In am  .  gabapentin (NEURONTIN) 300 MG capsule Take 2 capsules (600 mg total) by mouth in the morning and at bedtime.  Marland Kitchen ipratropium-albuterol (DUONEB) 0.5-2.5 (3) MG/3ML SOLN Take 3 mLs by nebulization 3 (three) times daily as needed. (Patient taking differently: Take 3 mLs by nebulization 3 (three) times daily as needed (shortness of breath). )  . lamoTRIgine (LAMICTAL) 100 MG tablet Take 1 tablet (100 mg total) by mouth 2 (two) times daily.  Marland Kitchen leflunomide (ARAVA) 20 MG tablet Take 1 tablet (20 mg total) by mouth daily.  Marland Kitchen lovastatin (MEVACOR) 20 MG tablet Take 1 tablet (20 mg total) by mouth at bedtime.  . montelukast (SINGULAIR) 10 MG tablet Take 1 tablet (10 mg total) by mouth daily.  . multivitamin-lutein (OCUVITE-LUTEIN) CAPS capsule Take 1 capsule by mouth daily.   Marland Kitchen MYRBETRIQ 50 MG TB24 tablet TAKE 1 TABLET BY MOUTH EVERYDAY AT BEDTIME  . nebivolol (BYSTOLIC) 10 MG tablet Take 0.5 tablets (5 mg total) by mouth in the morning and at bedtime.  . pantoprazole (PROTONIX) 40 MG tablet Take 1 tablet (40 mg total) by mouth 2 (two) times daily. 30 minutes before food. Note reduction in frequency  . QUEtiapine (SEROQUEL) 25 MG tablet Take 1 tablet (25 mg total) by mouth at bedtime.  . sucralfate (CARAFATE) 1 g tablet Take 1 tablet (1 g total) by mouth 2 (two) times daily.  . [DISCONTINUED] Melatonin 10 MG CAPS Take 10 mg by mouth at bedtime.  . [DISCONTINUED] predniSONE (DELTASONE) 10 MG tablet 2 tablets daily for 4 days then 1 tablet daily until seen again in follow-up (Patient taking differently: Take 10 mg by mouth daily. )   Immunization History  Administered Date(s) Administered  . Fluad Quad(high Dose 65+) 10/03/2018, 10/16/2019  . Influenza Split 10/04/2012, 10/25/2013, 10/28/2016  . Influenza Whole 11/03/2011  . Influenza, High Dose Seasonal PF 10/04/2015, 11/03/2016  . Influenza,inj,Quad PF,6+ Mos 10/31/2014  . Influenza-Unspecified 01/02/2010, 11/24/2011, 10/21/2013, 10/31/2014, 09/28/2017  .  Pneumococcal Conjugate-13 10/21/2013  . Pneumococcal Polysaccharide-23 11/07/2015       Objective:   Physical Exam BP 130/78 (BP Location: Left Arm, Cuff Size: Normal)   Pulse 67   Temp 97.7 F (36.5 C) (Temporal)   Ht 5' 1.5" (1.562 m)   Wt 166 lb (75.3 kg)   SpO2 97%   BMI 30.86 kg/m   GENERAL: Well-developed, well-nourished elderly woman, she is fully ambulatory today. Not ill appearing.Not toxic appearing. HEAD: Normocephalic, atraumatic.  EYES: Pupils equal, round, reactive to light. No scleral icterus.  MOUTH: Nose/mouth/throat not examined due to masking requirements for COVID 19. NECK: Supple.  No thyromegaly. Trachea midline. No JVD. No adenopathy. PULMONARY: Coarse breath sounds throughout, faint dry crackles at the basesmore pronounced on the left base, no wheezes or other adventitious sounds. CARDIOVASCULAR: S1 and S2. Regular rate and rhythm.Grade 1/6 systolic ejection murmur left sternal border, unchanged. GASTROINTESTINAL: Benign MUSCULOSKELETAL: Mild changes of RA both hands, no clubbing, no edema. Limited range of motion of joints due to RA.  Decreased motion on the left shoulder. NEUROLOGIC: No overt focal deficits. Awake, alert, speech is fluent. SKIN: Intact,warm,dry.  No rashes noted. PSYCH:Mood and behavior appropriate      Assessment & Plan:     ICD-10-CM   1. Asthma-COPD overlap syndrome ([place])  J44.9    Well compensated at present Continue DuoNeb and Pulmicort   2. Chronic cough  R05.3    Multifactorial ILD, asthma/COPD, RA medications Improved on prednisone Decrease prednisone to 7.5 mg daily  3. Rheumatoid lung (HCC)  M05.10    Compensated currently Decrease steroids to 7.5 mg daily  4. Nocturnal hypoxemia due to obstructive chronic bronchitis (HCC)  J44.9    G47.36    Qualifies for home O2 O2 at 2 L/min nocturnally  5. Pre-operative respiratory examination  Z01.811    She is as compensated as she is to get For the purposes of  the proposed procedure she is a moderate risk This risk cannot be further reduced   Discussion:  Patient's cough has now resolved will start tapering prednisone slowly.  SPECT she may need to remain on 5 mg daily.  For now will decrease to 7.5 mg daily.  Her cough is multifactorial likely related to underlying ILD, asthma COPD overlap and +/- combination of rheumatoid arthritismedications of Arava and Humira.  She is to continue nebulization treatments with DuoNeb and Pulmicort as previously prescribed.  Patient is to undergo left shoulder repair on Thursday 18 November.  From an anesthesia standpoint patient is moderate risk, this risk cannot be further reduced.  She is as compensated as she is to be.  She does have issues with nocturnal hypoxemia and will be on supplemental oxygen during sleep.  This will need to be taken into account postoperatively as narcotics/sedatives may worsen hypoxemia.  We will see the patient in follow-up in 3 months time she is to contact us prior to that time should any new difficulties arise.   Renold Don, MD [facility]   *This note was dictated using voice recognition software/Dragon.  Despite best efforts to proofread, errors can occur which can change the meaning.  Any change was purely unintentional.

## 2019-12-19 NOTE — Patient Instructions (Signed)
You are as well as you can be for your upcoming surgery.  We are decreasing your prednisone to 7.5 mg (1-1/2 tablets of the 5 mg size) once daily.  We will see you in follow-up in 3 months time call sooner should any new problems arise.

## 2019-12-20 LAB — SARS CORONAVIRUS 2 (TAT 6-24 HRS): SARS Coronavirus 2: NEGATIVE

## 2019-12-20 LAB — TYPE AND SCREEN
ABO/RH(D): AB POS
Antibody Screen: NEGATIVE
PT AG Type: NEGATIVE

## 2019-12-20 MED ORDER — CLINDAMYCIN PHOSPHATE 900 MG/50ML IV SOLN
900.0000 mg | INTRAVENOUS | Status: AC
  Administered 2019-12-21: 900 mg via INTRAVENOUS

## 2019-12-20 MED ORDER — LACTATED RINGERS IV SOLN: INTRAVENOUS | Status: DC

## 2019-12-20 MED ORDER — ORAL CARE MOUTH RINSE: 15.0000 mL | Freq: Once | OROMUCOSAL | Status: AC

## 2019-12-20 MED ORDER — CHLORHEXIDINE GLUCONATE 0.12 % MT SOLN: 15.0000 mL | Freq: Once | OROMUCOSAL | Status: AC

## 2019-12-21 ENCOUNTER — Inpatient Hospital Stay: Payer: Medicare PPO | Admitting: Urgent Care

## 2019-12-21 ENCOUNTER — Inpatient Hospital Stay: Payer: Medicare PPO

## 2019-12-21 ENCOUNTER — Other Ambulatory Visit: Payer: Self-pay

## 2019-12-21 ENCOUNTER — Encounter: Payer: Self-pay | Admitting: Surgery

## 2019-12-21 ENCOUNTER — Inpatient Hospital Stay
Admission: RE | Admit: 2019-12-21 | Discharge: 2019-12-26 | DRG: 507 | Disposition: A | Discharge status: Home Health | Payer: Medicare PPO | Attending: Surgery | Admitting: Surgery

## 2019-12-21 ENCOUNTER — Encounter
Admission: RE | Disposition: A | Discharge status: Home / Self Care | Payer: Self-pay | Source: Home / Self Care | Attending: Surgery

## 2019-12-21 DIAGNOSIS — F32A Depression, unspecified: Secondary | ICD-10-CM | POA: Diagnosis present

## 2019-12-21 DIAGNOSIS — Q613 Polycystic kidney, unspecified: Secondary | ICD-10-CM | POA: Diagnosis not present

## 2019-12-21 DIAGNOSIS — I130 Hypertensive heart and chronic kidney disease with heart failure and stage 1 through stage 4 chronic kidney disease, or unspecified chronic kidney disease: Secondary | ICD-10-CM | POA: Diagnosis present

## 2019-12-21 DIAGNOSIS — Z981 Arthrodesis status: Secondary | ICD-10-CM

## 2019-12-21 DIAGNOSIS — Z888 Allergy status to other drugs, medicaments and biological substances status: Secondary | ICD-10-CM

## 2019-12-21 DIAGNOSIS — I351 Nonrheumatic aortic (valve) insufficiency: Secondary | ICD-10-CM | POA: Diagnosis present

## 2019-12-21 DIAGNOSIS — I701 Atherosclerosis of renal artery: Secondary | ICD-10-CM | POA: Diagnosis present

## 2019-12-21 DIAGNOSIS — M810 Age-related osteoporosis without current pathological fracture: Secondary | ICD-10-CM | POA: Diagnosis present

## 2019-12-21 DIAGNOSIS — E1151 Type 2 diabetes mellitus with diabetic peripheral angiopathy without gangrene: Secondary | ICD-10-CM | POA: Diagnosis present

## 2019-12-21 DIAGNOSIS — J849 Interstitial pulmonary disease, unspecified: Secondary | ICD-10-CM | POA: Diagnosis present

## 2019-12-21 DIAGNOSIS — E785 Hyperlipidemia, unspecified: Secondary | ICD-10-CM | POA: Diagnosis present

## 2019-12-21 DIAGNOSIS — Z803 Family history of malignant neoplasm of breast: Secondary | ICD-10-CM

## 2019-12-21 DIAGNOSIS — Z09 Encounter for follow-up examination after completed treatment for conditions other than malignant neoplasm: Secondary | ICD-10-CM

## 2019-12-21 DIAGNOSIS — E039 Hypothyroidism, unspecified: Secondary | ICD-10-CM | POA: Diagnosis present

## 2019-12-21 DIAGNOSIS — J449 Chronic obstructive pulmonary disease, unspecified: Secondary | ICD-10-CM | POA: Diagnosis present

## 2019-12-21 DIAGNOSIS — M069 Rheumatoid arthritis, unspecified: Secondary | ICD-10-CM | POA: Diagnosis present

## 2019-12-21 DIAGNOSIS — Z823 Family history of stroke: Secondary | ICD-10-CM

## 2019-12-21 DIAGNOSIS — E1122 Type 2 diabetes mellitus with diabetic chronic kidney disease: Secondary | ICD-10-CM | POA: Diagnosis present

## 2019-12-21 DIAGNOSIS — Z886 Allergy status to analgesic agent status: Secondary | ICD-10-CM

## 2019-12-21 DIAGNOSIS — Z96612 Presence of left artificial shoulder joint: Secondary | ICD-10-CM | POA: Diagnosis present

## 2019-12-21 DIAGNOSIS — Z882 Allergy status to sulfonamides status: Secondary | ICD-10-CM

## 2019-12-21 DIAGNOSIS — Z800 Family history of malignant neoplasm of digestive organs: Secondary | ICD-10-CM

## 2019-12-21 DIAGNOSIS — Z20822 Contact with and (suspected) exposure to covid-19: Secondary | ICD-10-CM | POA: Diagnosis present

## 2019-12-21 DIAGNOSIS — Z96611 Presence of right artificial shoulder joint: Secondary | ICD-10-CM | POA: Diagnosis present

## 2019-12-21 DIAGNOSIS — Z96643 Presence of artificial hip joint, bilateral: Secondary | ICD-10-CM | POA: Diagnosis present

## 2019-12-21 DIAGNOSIS — Z96651 Presence of right artificial knee joint: Secondary | ICD-10-CM | POA: Diagnosis present

## 2019-12-21 DIAGNOSIS — Z833 Family history of diabetes mellitus: Secondary | ICD-10-CM

## 2019-12-21 DIAGNOSIS — Z818 Family history of other mental and behavioral disorders: Secondary | ICD-10-CM

## 2019-12-21 DIAGNOSIS — M84312K Stress fracture, left shoulder, subsequent encounter for fracture with nonunion: Principal | ICD-10-CM | POA: Diagnosis present

## 2019-12-21 DIAGNOSIS — Z419 Encounter for procedure for purposes other than remedying health state, unspecified: Secondary | ICD-10-CM

## 2019-12-21 DIAGNOSIS — F449 Dissociative and conversion disorder, unspecified: Secondary | ICD-10-CM | POA: Diagnosis present

## 2019-12-21 DIAGNOSIS — F419 Anxiety disorder, unspecified: Secondary | ICD-10-CM | POA: Diagnosis present

## 2019-12-21 DIAGNOSIS — I2510 Atherosclerotic heart disease of native coronary artery without angina pectoris: Secondary | ICD-10-CM | POA: Diagnosis present

## 2019-12-21 DIAGNOSIS — M1990 Unspecified osteoarthritis, unspecified site: Secondary | ICD-10-CM | POA: Diagnosis present

## 2019-12-21 DIAGNOSIS — Z8711 Personal history of peptic ulcer disease: Secondary | ICD-10-CM

## 2019-12-21 DIAGNOSIS — Z881 Allergy status to other antibiotic agents status: Secondary | ICD-10-CM

## 2019-12-21 DIAGNOSIS — Z811 Family history of alcohol abuse and dependence: Secondary | ICD-10-CM

## 2019-12-21 DIAGNOSIS — Z905 Acquired absence of kidney: Secondary | ICD-10-CM

## 2019-12-21 DIAGNOSIS — Z887 Allergy status to serum and vaccine status: Secondary | ICD-10-CM

## 2019-12-21 DIAGNOSIS — K219 Gastro-esophageal reflux disease without esophagitis: Secondary | ICD-10-CM | POA: Diagnosis present

## 2019-12-21 DIAGNOSIS — M8438XK Stress fracture, other site, subsequent encounter for fracture with nonunion: Secondary | ICD-10-CM | POA: Diagnosis present

## 2019-12-21 DIAGNOSIS — Z79899 Other long term (current) drug therapy: Secondary | ICD-10-CM

## 2019-12-21 DIAGNOSIS — I129 Hypertensive chronic kidney disease with stage 1 through stage 4 chronic kidney disease, or unspecified chronic kidney disease: Secondary | ICD-10-CM | POA: Diagnosis present

## 2019-12-21 DIAGNOSIS — N183 Chronic kidney disease, stage 3 unspecified: Secondary | ICD-10-CM | POA: Diagnosis present

## 2019-12-21 DIAGNOSIS — Z8249 Family history of ischemic heart disease and other diseases of the circulatory system: Secondary | ICD-10-CM

## 2019-12-21 DIAGNOSIS — Z825 Family history of asthma and other chronic lower respiratory diseases: Secondary | ICD-10-CM

## 2019-12-21 DIAGNOSIS — Z8261 Family history of arthritis: Secondary | ICD-10-CM

## 2019-12-21 DIAGNOSIS — Z820 Family history of epilepsy and other diseases of the nervous system: Secondary | ICD-10-CM

## 2019-12-21 DIAGNOSIS — Z801 Family history of malignant neoplasm of trachea, bronchus and lung: Secondary | ICD-10-CM

## 2019-12-21 DIAGNOSIS — Z87891 Personal history of nicotine dependence: Secondary | ICD-10-CM

## 2019-12-21 DIAGNOSIS — I509 Heart failure, unspecified: Secondary | ICD-10-CM | POA: Diagnosis present

## 2019-12-21 DIAGNOSIS — G4733 Obstructive sleep apnea (adult) (pediatric): Secondary | ICD-10-CM | POA: Diagnosis present

## 2019-12-21 DIAGNOSIS — Z885 Allergy status to narcotic agent status: Secondary | ICD-10-CM

## 2019-12-21 DIAGNOSIS — Z91041 Radiographic dye allergy status: Secondary | ICD-10-CM

## 2019-12-21 DIAGNOSIS — S42123A Displaced fracture of acromial process, unspecified shoulder, initial encounter for closed fracture: Secondary | ICD-10-CM

## 2019-12-21 HISTORY — DX: Idiopathic sleep related nonobstructive alveolar hypoventilation: G47.34

## 2019-12-21 HISTORY — DX: Displaced fracture of acromial process, unspecified shoulder, initial encounter for closed fracture: S42.123A

## 2019-12-21 HISTORY — DX: Peripheral vascular disease, unspecified: I73.9

## 2019-12-21 HISTORY — PX: ORIF SCAPHOID FRACTURE: SHX2130

## 2019-12-21 HISTORY — DX: Interstitial pulmonary disease, unspecified: J84.9

## 2019-12-21 SURGERY — OPEN REDUCTION INTERNAL FIXATION (ORIF) SCAPHOID FRACTURE
Anesthesia: General | Site: Shoulder | Laterality: Left

## 2019-12-21 MED ORDER — ACETAMINOPHEN 10 MG/ML IV SOLN
INTRAVENOUS | Status: AC
  Filled 2019-12-21: qty 100

## 2019-12-21 MED ORDER — ALPRAZOLAM 0.25 MG PO TABS
0.2500 mg | ORAL_TABLET | Freq: Every day | ORAL | Status: DC
  Administered 2019-12-22 – 2019-12-25 (×4): 0.25 mg via ORAL
  Filled 2019-12-21 (×4): qty 1

## 2019-12-21 MED ORDER — NEOSTIGMINE METHYLSULFATE 10 MG/10ML IV SOLN
INTRAVENOUS | Status: DC | PRN
  Administered 2019-12-21: 3 mg via INTRAVENOUS

## 2019-12-21 MED ORDER — IPRATROPIUM-ALBUTEROL 0.5-2.5 (3) MG/3ML IN SOLN
3.0000 mL | Freq: Three times a day (TID) | RESPIRATORY_TRACT | Status: DC | PRN
  Administered 2019-12-22 – 2019-12-26 (×5): 3 mL via RESPIRATORY_TRACT
  Filled 2019-12-21 (×4): qty 3

## 2019-12-21 MED ORDER — DICYCLOMINE HCL 10 MG PO CAPS
20.0000 mg | ORAL_CAPSULE | Freq: Two times a day (BID) | ORAL | Status: DC
  Administered 2019-12-21 – 2019-12-26 (×10): 20 mg via ORAL
  Filled 2019-12-21 (×11): qty 2

## 2019-12-21 MED ORDER — BUPIVACAINE LIPOSOME 1.3 % IJ SUSP
INTRAMUSCULAR | Status: AC
  Filled 2019-12-21: qty 20

## 2019-12-21 MED ORDER — CHLORHEXIDINE GLUCONATE 0.12 % MT SOLN
OROMUCOSAL | Status: AC
  Administered 2019-12-21: 15 mL via OROMUCOSAL
  Filled 2019-12-21: qty 15

## 2019-12-21 MED ORDER — ONDANSETRON HCL 4 MG PO TABS: 4.0000 mg | ORAL_TABLET | Freq: Four times a day (QID) | ORAL | Status: DC | PRN

## 2019-12-21 MED ORDER — ESCITALOPRAM OXALATE 10 MG PO TABS
10.0000 mg | ORAL_TABLET | Freq: Every day | ORAL | Status: DC
  Administered 2019-12-22 – 2019-12-26 (×5): 10 mg via ORAL
  Filled 2019-12-21 (×5): qty 1

## 2019-12-21 MED ORDER — SUCRALFATE 1 G PO TABS
1.0000 g | ORAL_TABLET | Freq: Two times a day (BID) | ORAL | Status: DC
  Administered 2019-12-21 – 2019-12-26 (×9): 1 g via ORAL
  Filled 2019-12-21 (×11): qty 1

## 2019-12-21 MED ORDER — FENTANYL CITRATE (PF) 100 MCG/2ML IJ SOLN
INTRAMUSCULAR | Status: AC
  Filled 2019-12-21: qty 2

## 2019-12-21 MED ORDER — LAMOTRIGINE 25 MG PO TABS
100.0000 mg | ORAL_TABLET | Freq: Two times a day (BID) | ORAL | Status: DC
  Administered 2019-12-21 – 2019-12-26 (×9): 100 mg via ORAL
  Filled 2019-12-21 (×3): qty 4
  Filled 2019-12-21: qty 1
  Filled 2019-12-21: qty 4
  Filled 2019-12-21: qty 1
  Filled 2019-12-21 (×2): qty 4
  Filled 2019-12-21: qty 1

## 2019-12-21 MED ORDER — CLINDAMYCIN PHOSPHATE 600 MG/50ML IV SOLN
INTRAVENOUS | Status: AC
  Administered 2019-12-21: 600 mg via INTRAVENOUS
  Filled 2019-12-21: qty 50

## 2019-12-21 MED ORDER — BUPIVACAINE LIPOSOME 1.3 % IJ SUSP
INTRAMUSCULAR | Status: DC | PRN
  Administered 2019-12-21: 20 mL

## 2019-12-21 MED ORDER — ROCURONIUM BROMIDE 100 MG/10ML IV SOLN
INTRAVENOUS | Status: DC | PRN
  Administered 2019-12-21: 10 mg via INTRAVENOUS
  Administered 2019-12-21: 50 mg via INTRAVENOUS
  Administered 2019-12-21: 20 mg via INTRAVENOUS

## 2019-12-21 MED ORDER — METOCLOPRAMIDE HCL 5 MG/ML IJ SOLN: 5.0000 mg | Freq: Three times a day (TID) | INTRAMUSCULAR | Status: DC | PRN

## 2019-12-21 MED ORDER — LACTATED RINGERS IV SOLN: INTRAVENOUS | Status: DC

## 2019-12-21 MED ORDER — GLYCOPYRROLATE 0.2 MG/ML IJ SOLN
INTRAMUSCULAR | Status: AC
  Filled 2019-12-21: qty 1

## 2019-12-21 MED ORDER — PROPOFOL 10 MG/ML IV BOLUS
INTRAVENOUS | Status: DC | PRN
  Administered 2019-12-21: 130 mg via INTRAVENOUS

## 2019-12-21 MED ORDER — FENTANYL CITRATE (PF) 250 MCG/5ML IJ SOLN
INTRAMUSCULAR | Status: DC | PRN
  Administered 2019-12-21 (×2): 50 ug via INTRAVENOUS

## 2019-12-21 MED ORDER — LIDOCAINE HCL (PF) 2 % IJ SOLN
INTRAMUSCULAR | Status: AC
  Filled 2019-12-21: qty 5

## 2019-12-21 MED ORDER — CLINDAMYCIN PHOSPHATE 900 MG/50ML IV SOLN
INTRAVENOUS | Status: AC
  Filled 2019-12-21: qty 50

## 2019-12-21 MED ORDER — AMLODIPINE BESYLATE 5 MG PO TABS
2.5000 mg | ORAL_TABLET | Freq: Every day | ORAL | Status: DC
  Administered 2019-12-22 – 2019-12-26 (×5): 2.5 mg via ORAL
  Filled 2019-12-21 (×2): qty 1
  Filled 2019-12-21: qty 0.5
  Filled 2019-12-21 (×2): qty 1

## 2019-12-21 MED ORDER — ONDANSETRON HCL 4 MG/2ML IJ SOLN
INTRAMUSCULAR | Status: AC
  Filled 2019-12-21: qty 2

## 2019-12-21 MED ORDER — ENOXAPARIN SODIUM 40 MG/0.4ML ~~LOC~~ SOLN
40.0000 mg | SUBCUTANEOUS | Status: DC
  Administered 2019-12-23 – 2019-12-24 (×2): 40 mg via SUBCUTANEOUS
  Filled 2019-12-21 (×4): qty 0.4

## 2019-12-21 MED ORDER — DOCUSATE SODIUM 100 MG PO CAPS
100.0000 mg | ORAL_CAPSULE | Freq: Two times a day (BID) | ORAL | Status: DC
  Administered 2019-12-21 – 2019-12-24 (×7): 100 mg via ORAL
  Filled 2019-12-21 (×10): qty 1

## 2019-12-21 MED ORDER — MAGNESIUM HYDROXIDE 400 MG/5ML PO SUSP
30.0000 mL | Freq: Every day | ORAL | Status: DC | PRN
  Filled 2019-12-21: qty 30

## 2019-12-21 MED ORDER — CLINDAMYCIN PHOSPHATE 600 MG/50ML IV SOLN: 600.0000 mg | Freq: Four times a day (QID) | INTRAVENOUS | Status: AC

## 2019-12-21 MED ORDER — GLYCOPYRROLATE 0.2 MG/ML IJ SOLN
INTRAMUSCULAR | Status: AC
  Filled 2019-12-21: qty 2

## 2019-12-21 MED ORDER — QUETIAPINE FUMARATE 25 MG PO TABS
25.0000 mg | ORAL_TABLET | Freq: Every day | ORAL | Status: DC
  Administered 2019-12-21 – 2019-12-25 (×5): 25 mg via ORAL
  Filled 2019-12-21 (×6): qty 1

## 2019-12-21 MED ORDER — ROCURONIUM BROMIDE 10 MG/ML (PF) SYRINGE
PREFILLED_SYRINGE | INTRAVENOUS | Status: AC
  Filled 2019-12-21: qty 10

## 2019-12-21 MED ORDER — GLYCOPYRROLATE 0.2 MG/ML IJ SOLN
INTRAMUSCULAR | Status: DC | PRN
  Administered 2019-12-21: .6 mg via INTRAVENOUS

## 2019-12-21 MED ORDER — GABAPENTIN 300 MG PO CAPS
600.0000 mg | ORAL_CAPSULE | Freq: Two times a day (BID) | ORAL | Status: DC
  Administered 2019-12-22 – 2019-12-26 (×8): 600 mg via ORAL
  Filled 2019-12-21 (×8): qty 2

## 2019-12-21 MED ORDER — FENTANYL CITRATE (PF) 100 MCG/2ML IJ SOLN
25.0000 ug | INTRAMUSCULAR | Status: DC | PRN
  Administered 2019-12-21: 25 ug via INTRAVENOUS

## 2019-12-21 MED ORDER — LEFLUNOMIDE 20 MG PO TABS
20.0000 mg | ORAL_TABLET | Freq: Every day | ORAL | Status: DC
  Administered 2019-12-21 – 2019-12-26 (×6): 20 mg via ORAL
  Filled 2019-12-21 (×6): qty 1

## 2019-12-21 MED ORDER — ONDANSETRON HCL 4 MG/2ML IJ SOLN: 4.0000 mg | Freq: Four times a day (QID) | INTRAMUSCULAR | Status: DC | PRN

## 2019-12-21 MED ORDER — ALBUTEROL SULFATE HFA 108 (90 BASE) MCG/ACT IN AERS
2.0000 | INHALATION_SPRAY | Freq: Four times a day (QID) | RESPIRATORY_TRACT | Status: DC | PRN
  Filled 2019-12-21 (×2): qty 6.7

## 2019-12-21 MED ORDER — GABAPENTIN 300 MG PO CAPS
ORAL_CAPSULE | ORAL | Status: AC
  Administered 2019-12-21: 600 mg via ORAL
  Filled 2019-12-21: qty 2

## 2019-12-21 MED ORDER — 0.9 % SODIUM CHLORIDE (POUR BTL) OPTIME
TOPICAL | Status: DC | PRN
  Administered 2019-12-21: 1000 mL

## 2019-12-21 MED ORDER — METOCLOPRAMIDE HCL 10 MG PO TABS: 5.0000 mg | ORAL_TABLET | Freq: Three times a day (TID) | ORAL | Status: DC | PRN

## 2019-12-21 MED ORDER — VITAMIN D3 25 MCG (1000 UNIT) PO TABS
4000.0000 [IU] | ORAL_TABLET | Freq: Every day | ORAL | Status: DC
  Administered 2019-12-22 – 2019-12-26 (×5): 4000 [IU] via ORAL
  Filled 2019-12-21 (×9): qty 4

## 2019-12-21 MED ORDER — FLUTICASONE PROPIONATE 50 MCG/ACT NA SUSP
2.0000 | Freq: Every day | NASAL | Status: DC
  Administered 2019-12-22 – 2019-12-26 (×5): 2 via NASAL
  Filled 2019-12-21: qty 16

## 2019-12-21 MED ORDER — LIDOCAINE HCL (CARDIAC) PF 100 MG/5ML IV SOSY
PREFILLED_SYRINGE | INTRAVENOUS | Status: DC | PRN
  Administered 2019-12-21: 80 mg via INTRAVENOUS

## 2019-12-21 MED ORDER — KETAMINE HCL 50 MG/ML IJ SOLN
INTRAMUSCULAR | Status: DC | PRN
  Administered 2019-12-21: 50 mg via INTRAMUSCULAR

## 2019-12-21 MED ORDER — HYDROCODONE-ACETAMINOPHEN 5-325 MG PO TABS
1.0000 | ORAL_TABLET | ORAL | Status: DC | PRN
  Administered 2019-12-22: 1 via ORAL

## 2019-12-21 MED ORDER — ACETAMINOPHEN 500 MG PO TABS
ORAL_TABLET | ORAL | Status: AC
  Administered 2019-12-21: 500 mg via ORAL
  Filled 2019-12-21: qty 1

## 2019-12-21 MED ORDER — OCUVITE-LUTEIN PO CAPS
1.0000 | ORAL_CAPSULE | Freq: Every day | ORAL | Status: DC
  Administered 2019-12-22 – 2019-12-26 (×5): 1 via ORAL
  Filled 2019-12-21 (×5): qty 1

## 2019-12-21 MED ORDER — MONTELUKAST SODIUM 10 MG PO TABS
10.0000 mg | ORAL_TABLET | Freq: Every day | ORAL | Status: DC
  Administered 2019-12-23 – 2019-12-26 (×4): 10 mg via ORAL
  Filled 2019-12-21 (×5): qty 1

## 2019-12-21 MED ORDER — DEXAMETHASONE SODIUM PHOSPHATE 10 MG/ML IJ SOLN
INTRAMUSCULAR | Status: AC
  Filled 2019-12-21: qty 1

## 2019-12-21 MED ORDER — ONDANSETRON HCL 4 MG/2ML IJ SOLN
INTRAMUSCULAR | Status: DC | PRN
  Administered 2019-12-21: 4 mg via INTRAVENOUS

## 2019-12-21 MED ORDER — ACETAMINOPHEN 10 MG/ML IV SOLN
INTRAVENOUS | Status: DC | PRN
  Administered 2019-12-21: 1000 mg via INTRAVENOUS

## 2019-12-21 MED ORDER — BISACODYL 10 MG RE SUPP
10.0000 mg | Freq: Every day | RECTAL | Status: DC | PRN
  Filled 2019-12-21: qty 1

## 2019-12-21 MED ORDER — SODIUM CHLORIDE 0.9 % IV SOLN
INTRAVENOUS | Status: DC | PRN
  Administered 2019-12-21: 40 ug/min via INTRAVENOUS

## 2019-12-21 MED ORDER — DIPHENHYDRAMINE HCL 12.5 MG/5ML PO ELIX
12.5000 mg | ORAL_SOLUTION | ORAL | Status: DC | PRN
  Filled 2019-12-21: qty 10

## 2019-12-21 MED ORDER — MIRABEGRON ER 25 MG PO TB24
25.0000 mg | ORAL_TABLET | Freq: Every day | ORAL | Status: DC
  Administered 2019-12-21 – 2019-12-26 (×5): 25 mg via ORAL
  Filled 2019-12-21 (×6): qty 1

## 2019-12-21 MED ORDER — PRAVASTATIN SODIUM 20 MG PO TABS
20.0000 mg | ORAL_TABLET | Freq: Every day | ORAL | Status: DC
  Administered 2019-12-21 – 2019-12-25 (×5): 20 mg via ORAL
  Filled 2019-12-21 (×6): qty 1

## 2019-12-21 MED ORDER — PROPOFOL 10 MG/ML IV BOLUS
INTRAVENOUS | Status: AC
  Filled 2019-12-21: qty 20

## 2019-12-21 MED ORDER — BUDESONIDE 0.25 MG/2ML IN SUSP
0.2500 mg | Freq: Every day | RESPIRATORY_TRACT | Status: DC
  Administered 2019-12-22 – 2019-12-26 (×5): 0.25 mg via RESPIRATORY_TRACT
  Filled 2019-12-21 (×5): qty 2

## 2019-12-21 MED ORDER — ACETAMINOPHEN 325 MG PO TABS: 325.0000 mg | ORAL_TABLET | Freq: Four times a day (QID) | ORAL | Status: DC | PRN

## 2019-12-21 MED ORDER — NEBIVOLOL HCL 5 MG PO TABS
5.0000 mg | ORAL_TABLET | Freq: Two times a day (BID) | ORAL | Status: DC
  Administered 2019-12-21 – 2019-12-26 (×10): 5 mg via ORAL
  Filled 2019-12-21 (×11): qty 1

## 2019-12-21 MED ORDER — TRAMADOL HCL 50 MG PO TABS: 50.0000 mg | ORAL_TABLET | Freq: Four times a day (QID) | ORAL | Status: DC | PRN

## 2019-12-21 MED ORDER — PHENYLEPHRINE HCL (PRESSORS) 10 MG/ML IV SOLN
INTRAVENOUS | Status: AC
  Filled 2019-12-21: qty 1

## 2019-12-21 MED ORDER — ACETAMINOPHEN 500 MG PO TABS: 500.0000 mg | ORAL_TABLET | Freq: Four times a day (QID) | ORAL | Status: AC

## 2019-12-21 MED ORDER — HYDROCODONE-ACETAMINOPHEN 5-325 MG PO TABS
ORAL_TABLET | ORAL | Status: AC
  Administered 2019-12-21: 1 via ORAL
  Filled 2019-12-21: qty 1

## 2019-12-21 MED ORDER — ONDANSETRON HCL 4 MG/2ML IJ SOLN: 4.0000 mg | Freq: Once | INTRAMUSCULAR | Status: DC | PRN

## 2019-12-21 MED ORDER — ALPRAZOLAM 0.5 MG PO TABS
ORAL_TABLET | ORAL | Status: AC
  Administered 2019-12-21: 0.25 mg via ORAL
  Filled 2019-12-21: qty 1

## 2019-12-21 MED ORDER — MORPHINE SULFATE (PF) 4 MG/ML IV SOLN
0.5000 mg | INTRAVENOUS | Status: DC | PRN
  Administered 2019-12-21: 1 mg via INTRAVENOUS

## 2019-12-21 MED ORDER — DEXAMETHASONE SODIUM PHOSPHATE 10 MG/ML IJ SOLN
INTRAMUSCULAR | Status: DC | PRN
  Administered 2019-12-21: 10 mg via INTRAVENOUS

## 2019-12-21 MED ORDER — NEOSTIGMINE METHYLSULFATE 10 MG/10ML IV SOLN
INTRAVENOUS | Status: AC
  Filled 2019-12-21: qty 1

## 2019-12-21 MED ORDER — PANTOPRAZOLE SODIUM 40 MG PO TBEC
40.0000 mg | DELAYED_RELEASE_TABLET | Freq: Two times a day (BID) | ORAL | Status: DC
  Administered 2019-12-21 – 2019-12-26 (×10): 40 mg via ORAL
  Filled 2019-12-21 (×10): qty 1

## 2019-12-21 MED ORDER — MORPHINE SULFATE (PF) 2 MG/ML IV SOLN
INTRAVENOUS | Status: AC
  Filled 2019-12-21: qty 1

## 2019-12-21 MED ORDER — PREDNISONE 5 MG PO TABS
7.5000 mg | ORAL_TABLET | Freq: Every day | ORAL | Status: DC
  Administered 2019-12-22 – 2019-12-26 (×5): 7.5 mg via ORAL
  Filled 2019-12-21 (×5): qty 1

## 2019-12-21 MED ORDER — FLEET ENEMA 7-19 GM/118ML RE ENEM: 1.0000 | ENEMA | Freq: Once | RECTAL | Status: DC | PRN

## 2019-12-21 MED ORDER — BIOTIN 10000 MCG PO TABS: 10000.0000 ug | ORAL_TABLET | Freq: Every day | ORAL | Status: DC

## 2019-12-21 SURGICAL SUPPLY — 58 items
2.8 drill ×2 IMPLANT
BIT DRILL 2.8X5 QR DISP (BIT) ×2 IMPLANT
BLADE OSC/SAGITTAL MD 9X18.5 (BLADE) ×2 IMPLANT
BNDG COHESIVE 4X5 TAN STRL (GAUZE/BANDAGES/DRESSINGS) ×2 IMPLANT
BNDG ESMARK 4X12 TAN STRL LF (GAUZE/BANDAGES/DRESSINGS) ×2 IMPLANT
BONE CANC CHIPS 20CC PCAN1/4 (Bone Implant) ×2 IMPLANT
BUR RND POLISHING (BURR) ×2 IMPLANT
CANISTER SUCT 1200ML W/VALVE (MISCELLANEOUS) ×2 IMPLANT
CHIPS CANC BONE 20CC PCAN1/4 (Bone Implant) ×1 IMPLANT
CHLORAPREP W/TINT 26 (MISCELLANEOUS) ×4 IMPLANT
CORD BIP STRL DISP 12FT (MISCELLANEOUS) ×2 IMPLANT
COVER WAND RF STERILE (DRAPES) ×2 IMPLANT
DRAPE C-ARM XRAY 36X54 (DRAPES) ×2 IMPLANT
DRAPE FLUOR MINI C-ARM 54X84 (DRAPES) ×2 IMPLANT
DRAPE INCISE IOBAN 66X45 STRL (DRAPES) ×2 IMPLANT
DRAPE SPLIT 6X30 W/TAPE (DRAPES) ×2 IMPLANT
DRAPE SURG 17X11 SM STRL (DRAPES) ×2 IMPLANT
DRAPE U-SHAPE 47X51 STRL (DRAPES) ×2 IMPLANT
DRSG OPSITE POSTOP 4X8 (GAUZE/BANDAGES/DRESSINGS) ×2 IMPLANT
ELECT CAUTERY BLADE 6.4 (BLADE) ×2 IMPLANT
ELECT REM PT RETURN 9FT ADLT (ELECTROSURGICAL) ×2
ELECTRODE REM PT RTRN 9FT ADLT (ELECTROSURGICAL) ×1 IMPLANT
FORCEPS JEWEL BIP 4-3/4 STR (INSTRUMENTS) ×2 IMPLANT
GAUZE SPONGE 4X4 12PLY STRL (GAUZE/BANDAGES/DRESSINGS) ×2 IMPLANT
GAUZE XEROFORM 1X8 LF (GAUZE/BANDAGES/DRESSINGS) ×2 IMPLANT
GLOVE BIO SURGEON STRL SZ8 (GLOVE) ×4 IMPLANT
GLOVE INDICATOR 8.0 STRL GRN (GLOVE) ×2 IMPLANT
GOWN STRL REUS W/ TWL LRG LVL3 (GOWN DISPOSABLE) ×1 IMPLANT
GOWN STRL REUS W/ TWL XL LVL3 (GOWN DISPOSABLE) ×1 IMPLANT
GOWN STRL REUS W/TWL LRG LVL3 (GOWN DISPOSABLE) ×1
GOWN STRL REUS W/TWL XL LVL3 (GOWN DISPOSABLE) ×1
KIT SUTURE STERNTMY WIRE SGL 7 (WIRE) ×1 IMPLANT
KIT TURNOVER KIT A (KITS) ×2 IMPLANT
MANIFOLD NEPTUNE II (INSTRUMENTS) ×2 IMPLANT
NEEDLE FILTER BLUNT 18X 1/2SAF (NEEDLE) ×1
NEEDLE FILTER BLUNT 18X1 1/2 (NEEDLE) ×1 IMPLANT
NS IRRIG 500ML POUR BTL (IV SOLUTION) ×2 IMPLANT
PACK EXTREMITY (MISCELLANEOUS) ×2 IMPLANT
PAD CAST CTTN 4X4 STRL (SOFTGOODS) ×2 IMPLANT
PADDING CAST COTTON 4X4 STRL (SOFTGOODS) ×2
PLATE ANT CLAV 6 HOLE (Plate) ×2 IMPLANT
PLATE CLAVICAL MED ANTERIOR 8H (Plate) ×2 IMPLANT
PUTTY DBX 5CC (Putty) ×2 IMPLANT
SCREW CORTICAL 3.5X20MM (Screw) ×2 IMPLANT
SCREW HEX LOCK 3.5X20MM (Screw) ×2 IMPLANT
SCREW HEX NON LOCK 3.5X36MM (Screw) ×2 IMPLANT
SCREW HEXALOBE VA 3.5X12 (Screw) ×2 IMPLANT
SCREW NON LOCK 3.5X40 (Screw) ×2 IMPLANT
SCREW NON LOCK 3.5X8MM (Screw) ×2 IMPLANT
SCREW NONLOCK HEX 3.5X12 (Screw) ×2 IMPLANT
STAPLER SKIN PROX 35W (STAPLE) ×2 IMPLANT
STERNOTOMY WIRE SGL 7 (WIRE) ×2
STOCKINETTE IMPERVIOUS 9X36 MD (GAUZE/BANDAGES/DRESSINGS) ×2 IMPLANT
SUT PROLENE 4 0 PS 2 18 (SUTURE) ×4 IMPLANT
SUT VIC AB 0 CT1 36 (SUTURE) ×2 IMPLANT
SUT VIC AB 2-0 SH 27 (SUTURE) ×2
SUT VIC AB 2-0 SH 27XBRD (SUTURE) ×2 IMPLANT
SYR 10ML LL (SYRINGE) ×2 IMPLANT

## 2019-12-21 NOTE — Op Note (Signed)
12/21/2019  3:37 PM  Patient:   Kerri Carter  Pre-Op Diagnosis:   Closed displaced fracture of acromial process of left scapula with nonunion.  Post-Op Diagnosis:   Same  Procedure:   Takedown of nonunion and open reduction and internal fixation of left acromial fracture with bone grafting  Surgeon:   Pascal Lux, MD  Assistant:   [name] Proud, PA-C; Sherlene Shams, PA-S  Anesthesia:   GET  Findings:   As above.  Complications:   None  Fluids:   700 cc crystalloid  EBL:   25 cc  UOP:   None  TT:   None  Drains:   None  Closure:   Staples  Implants:   Acumed 8 hole anterior clavicle plate  Brief Clinical Note:   The patient is a 79 year old female who is now 16 months status post a reverse left total shoulder arthroplasty.  Her postoperative course has been complicated by a stress fracture of the base of the acromion which occurred approximately 6 months after surgery.  Despite extensive nonsurgical treatment, the patient continues to experience pain in the posterior shoulder region.  X-rays and CT scan confirmed the presence of a nonunion of the acromial fracture.  The patient presents at this time for takedown of the nonunion and open reduction internal fixation of the scapular fracture.  Procedure:   The patient was brought into the operating room and lain in the supine position.  After adequate general endotracheal intubation and anesthesia were obtained, the patient was repositioned in the right lateral decubitus position and secured using the beanbag positioner.  Care was taken to be sure that an axillary roll was used and that all bony surfaces were padded appropriately.  The left shoulder and upper extremity, as well as the lateral aspect of the left hip was prepped with ChloraPrep solution before being draped sterilely.  Preoperative antibiotics were administered.  A timeout was performed to verify the appropriate surgical site before an approximately 12 to 14 cm  incision was made over the posterior aspect of the shoulder centered along the scapular spine and extending from the edge of the acromion medially.  The incision was carried down through the subcutaneous tissues to expose the delto-trapezial fascia.  This was split along the dorsal surface of the scapular spine.  Care was taken to minimize dissection along the cranial or caudal surfaces of the scapular spine in order to maintain optimal blood supply to this area.  The nonunion site was identified.  The fibrous nonunion tissues were debrided using a rongeur, a #15 blade, curettes, elevators, osteotomes, and a microoscillating saw until good bleeding surfaces were established on both sides of the nonunion site.  A 2.0 mm K wire was driven from the nonunion site laterally through the acromion to exit the lateral margin of the acromion.  It was then advanced out through the skin before being redirected across the fracture site into the medial scapular spine while maintaining reduction across the fracture site.  Several attempts were made before the optimal angle was obtained.  In addition, given the size of the acromial fragment and the force required to maintain reduction, it was felt best to bend the lateral portion of the Acumed plate nearly 90 degrees so that the guidewire was passed through this hole in order to optimize fixation.  The plate was then secured across the dorsal edge of the scapular spine.  A compression screw was placed into one of the oblong holes medial  to the fracture in order to apply compression across the fracture site.  A second compression screw was applied laterally to further compress the fracture before a lag screw was passed obliquely across the fracture site.  Several additional locking screws were placed medial and lateral to the fracture site in order to secure the plate firmly to the scapular spine along with the tension side of the fracture.  An 18-gauge wire was then passed  beneath the plate medially and around the guidewire just lateral to the acromion in a figure-of-eight fashion.  The wire was tensioned securely before being cut short in order to create a tension band effect dorsal to the plate, again in order to minimize stress across the tension side of the fracture and create compression through the fracture site.  The 2 mm K wire was cut short and bent posteriorly while the 18-gauge wire was also cut short and bent into a position which should not cause symptoms for the patient.  The adequacy of fracture reduction and hardware position was verified fluoroscopically and found to be satisfactory.  Of note, bone morphogenic putty and allograft cancellous bone chips were packed around the nonunion site in order to stimulate healing.  The wound was copiously irrigated with sterile saline solution using bulb irrigation before the deltotrapezial fascia was reapproximated using #0 Vicryl interrupted sutures.  The subcutaneous tissues were closed using 2-0 Vicryl interrupted sutures before the skin was closed with staples.  A total of 20 cc of Exparel was injected in and around the incision site to help with postoperative analgesia before a sterile occlusive dressing was applied to the wound.  A Polar Care device was applied to the shoulder before the patient was placed into a shoulder immobilizer with abduction pillow.  The patient was then rolled back in the supine position before she was awakened, extubated, and returned to the recovery room in satisfactory condition after tolerating the procedure well.

## 2019-12-21 NOTE — Transfer of Care (Signed)
Immediate Anesthesia Transfer of Care Note  Patient: DESHANTA LADY  Procedure(s) Performed: OPEN REDUCTION INTERNAL FIXATION (ORIF) OF LEFT SCAPULAR NONUNION WITH BONE GRAFT (Left Shoulder)  Patient Location: PACU  Anesthesia Type:General  Level of Consciousness: drowsy  Airway & Oxygen Therapy: Patient Spontanous Breathing and Patient connected to face mask oxygen  Post-op Assessment: Report given to RN and Post -op Vital signs reviewed and stable  Post vital signs: Reviewed and stable  Last Vitals:  Vitals Value Taken Time  BP 150/59 12/21/19 1604  Temp    Pulse 71 12/21/19 1606  Resp 14 12/21/19 1606  SpO2 98 % 12/21/19 1606  Vitals shown include unvalidated device data.  Last Pain:  Vitals:   12/21/19 0850  TempSrc: Temporal  PainSc: 0-No pain         Complications: No complications documented.

## 2019-12-21 NOTE — H&P (Signed)
History of Present Illness:  Kerri Carter is a 79 y.o. female who presents for history and physical for ORIF of the left scapula for nonunion with Dr. Roland Rack on 12/21/2019. Patient suffered a stress fracture of the left acromion status post a reverse left total shoulder arthroplasty. Despite conservative treatment, she notes little change in her symptoms since her last visit 16 weeks ago. The patient continues to have pain in the posterior aspect of her shoulder, as well as difficulty with any lifting activities or with any activities at or above shoulder level. She has no pain at rest, and therefore has not been taking any medications for discomfort. She denies any recent reinjury to the shoulder, and denies any fevers or chills. She also denies any numbness or paresthesias down her arm to her hand. Patient is underwent CT scan of the left scapula showing nonunion.  Current Outpatient Medications: . benzonatate (TESSALON) 200 MG capsule Take 200 mg by mouth 3 (three) times daily as needed  . predniSONE (DELTASONE) 10 MG tablet 2 tablets daily for 4 days then 1 tablet daily until seen again in follow-up  . adalimumab (HUMIRA,CF, PEN) 40 mg/0.4 mL pen injector kit Inject 40 mg subcutaneously every 14 (fourteen) days 6 each 1  . albuterol 90 mcg/actuation inhaler Inhale 2 inhalations into the lungs every 4 (four) hours as needed for Wheezing.  Marland Kitchen ALPRAZolam (XANAX) 0.25 MG tablet TAKE 1 TABLET (0.25 MG TOTAL) BY MOUTH AT BEDTIME AS NEEDED FOR ANXIETY  . amLODIPine (NORVASC) 2.5 MG tablet TAKE 1 TABLET BY MOUTH EVERY DAY 90 tablet 3  . amoxicillin-clavulanate (AUGMENTIN) 875-125 mg tablet Take 1 tablet by mouth 2 (two) times daily  . budesonide (PULMICORT) 0.25 mg/2 mL nebulizer solution Take 0.25 mg by nebulization Take 2 mLs (0.25 mg total) by nebulization 2 (two) times daily.  Marland Kitchen BYSTOLIC 10 mg tablet TAKE 1 TABLET BY MOUTH EVERY DAY (Patient taking differently: Take 5 mg by mouth 2 (two) times daily )  90 tablet 3  . cholecalciferol (VITAMIN D3) 2,000 unit capsule Take 2 capsules (4,000 Units total) by mouth once daily 60 capsule 11  . clotrimazole (LOTRIMIN) 1 % cream Apply topically once daily  . dicyclomine (BENTYL) 10 mg capsule Take 20 mg by mouth 2 (two) times daily  . escitalopram oxalate (LEXAPRO) 10 MG tablet Take 10 mg by mouth once daily.  . famotidine (PEPCID) 20 MG tablet Take 1 tablet by mouth once daily  . gabapentin (NEURONTIN) 300 MG capsule Taking 2 capsules in the AM and 2 capsules in the PM  . ipratropium-albuteroL (DUO-NEB) nebulizer solution Take 3 mLs by nebulization 4 (four) times daily  . lamoTRIgine (LAMICTAL) 100 MG tablet Take 1 tablet by mouth 2 (two) times daily.  Marland Kitchen leflunomide (ARAVA) 20 MG tablet TAKE 1 TABLET BY MOUTH EVERY DAY 90 tablet 1  . lovastatin (MEVACOR) 40 MG tablet Take 20 mg by mouth daily with dinner  . melatonin 10 mg Tab Take by mouth Take 10 mg by mouth at bedtime.  . mirabegron (MYRBETRIQ) 50 mg ER tablet Take 1 tablet by mouth nightly  . montelukast (SINGULAIR) 10 mg tablet take 1 tablet by mouth once daily (Patient taking differently: once daily ) 30 tablet 5  . mupirocin (BACTROBAN) 2 % ointment Apply topically Apply 1 application topically 2 (two) times daily. X 1 week  . nebivoloL (BYSTOLIC) 10 MG tablet  . ondansetron (ZOFRAN-ODT) 4 MG disintegrating tablet Take 1 tablet by mouth as needed  .  pantoprazole (PROTONIX) 40 MG DR tablet take 1 tablet by mouth twice a day as directed 180 tablet 0  . psyllium seed, with dextrose, (FIBER ORAL) Take 1 tablet by mouth 2 (two) times daily  . QUEtiapine (SEROQUEL) 25 MG tablet Take 25 mg by mouth nightly.  . sucralfate (CARAFATE) 1 gram tablet Take 1 g by mouth 2 (two) times daily before meals  . vit C,E-Zn-coppr-lutein-zeaxan (OCUVITE LUTEIN AND ZEAXANTHIN) 60 mg-13.5 mg- 15 mg-2 mg-6 mg Cap Take 1 capsule by mouth once daily   Allergies:  . Ceftin [Cefuroxime Axetil] Anaphylaxis and Swelling   REACTION: tongue and throat swell Other Reaction: TONGUE AND THROAT SWELLING  . Lisinopril Angioedema, Anaphylaxis and Other (See Comments)  Other Reaction: TONGUE AND THROAT SWELLING REACTION: tongue and throat swelling (onset 10-10-09)  . Morphine Other (See Comments)  Per patient, low blood pressure issues, following morphine, that requires action to raise it back up. Drop in BP - can take small infrequent doses  . Sulfasalazine Anaphylaxis  Swelling tongue, lips, and jaws . Aspirin Other (See Comments)  Sulfasalazine allergy cross reacts  . Doxycycline Nausea  . Adhesive Dermatitis  Plastic bandages -- paper tape and/or tegaderm does fine per pt  . Antihistamine-1 Anxiety  Causes sever hyperactivity - benedryl IV is ok  . Antivert [Meclizine] Other (See Comments)  Other Reaction: BLADDER DOES NOT EMPTY  . Decongest Tabs Anxiety  Makes sever hyperactive  . Iodinated Contrast Media Hives and Rash  . Polymyxin B Other (See Comments)  Other reaction(s): Other (See Comments) Medication was in eye drops. Medication was in eye drops.  . Pseudoephedrine Hcl Other (See Comments)  REACTION: makes pt hyper  . Tetanus Vaccines And Toxoid Rash  . Xarelto [Rivaroxaban] Other (See Comments)  GI bleed Stomach burning, bleeding, and tar in stool  . Iodine Other (See Comments), Hives and Rash  Other Reaction: BREAK OUT AND ITCH Per patient allergy is to contrast dye only, she is able to use betadine scrubs.  . Levaquin [Levofloxacin] Rash  . Sulfa (Sulfonamide Antibiotics) Rash   Past Medical History:  . Anemia  . Anxiety 06/13/2013  . Aortic insufficiency  . Arrhythmia  . Asthma without status asthmaticus, unspecified  no recent flare up per pt on 09/08/16  . CHF (congestive heart failure) (CMS-HCC)  Patient states she has not be advised that she has this  . Colitis, unspecified  . Conversion disorder  . COPD (chronic obstructive pulmonary disease) (CMS-HCC)  . Coronary artery  disease  . Depression  . Diabetes mellitus type 2, uncomplicated (CMS-HCC)  diet controlled  . Gastritis  . GERD (gastroesophageal reflux disease)  . Hyperlipidemia  . Hypertension  . Inflammatory arthritis GWK  a. Low titer rheumatoid factor. b. Negative anti-CCP antibodies. c. Plaquenil.  . Kidney disease  . Osteoarthritis GWK  a. Hands. b. Carpal tunnel. c. Dupuytren's contractures.  . Osteoporosis  . Peripheral neuropathy  . Polycystic kidney disease  . Renal artery stenosis (CMS-HCC)  . Renal insufficiency  . Rheumatoid arthritis, adult (CMS-HCC)  . Sleep apnea  uses c-pap  . Ulcer  . Unspecified hypothyroidism  . Valvular heart disease  AI, MR, TR   Past Surgical History:  . ANTERIOR THORACIC SPINE FUSION MULTIPLE LEVELS N/A 09/08/2016  Procedure: ARTHRODESIS, ANTERIOR INTERBODY TECHNIQUE; Surgeon: Ricard Dillon, MD; Location: [facility]; Service: Neurosurgery; Laterality: N/A;  . APPENDECTOMY 1961  . APPENDECTOMY  . AUTOGRAFT OBTAINED SAME INCISION FOR SPINE SURGERY N/A 09/08/2016  Procedure: AUTOGRAFT FOR SPINE  SURGERY ONLY; Surgeon: Ricard Dillon, MD; Location: [place]; Service: Neurosurgery; Laterality: N/A;  . CARDIAC CATHETERIZATION 2011 and 2013  . CESAREAN SECTION  . CHOLECYSTECTOMY  . cholecystectomy 05/11/16  . COLONOSCOPY 09/17/2006  Adenomatous Polyp  . COLONOSCOPY 03/05/2011  PH Adenomatous Polyp: [facility] 02/2016; Recall Ltr mailed 12/20/2015 (dw)  . COLONOSCOPY 04/02/2016  Adenomatous Polyps (Dr. Vicente Males MD)  . DUODENUMOSCOPY W/CONTROL BLEEDING 06/29/2012  Procedure: DUODENUMOSCOPY W/CONTROL BLEEDING; Surgeon: Johnnette Litter, MD; Location: [facility]; Service: Gastroenterology;;  . EGD 03/05/2011  11/12/2008, 09/17/2006  . EGD 04/02/2016  Gastric Polyps, Duodenal Diverticulum (Dr. Vicente Males MD)  . EXTRACTION CATARACT EXTRACAPSULAR W/INSERTION INTRAOCULAR PROSTHESIS Right 01/13/2016  Procedure: R - Cataract Extraction with  intraocular lens implantation,; Surgeon: Moody Bruins, MD; Location: [facility]; Service: Ophthalmology; Laterality: Right;  . EXTRACTION CATARACT EXTRACAPSULAR W/INSERTION INTRAOCULAR PROSTHESIS Left 01/29/2016  Procedure: -L- Cataract Extraction with intraocular lens implantation, left eye; Surgeon: Moody Bruins, MD; Location: [facility]; Service: Ophthalmology; Laterality: Left;  . INSERTION MORSELIZED BONE ALLOGRAFT FOR SPINE SURGERY N/A 09/08/2016  Procedure: Drema Dallas, OR PLACEMENT OF OSTEOPROMOTIVE MATERIAL,; Surgeon: Ricard Dillon, MD; Location: [place]; Service: Neurosurgery; Laterality: N/A;  . JOINT REPLACEMENT Right  . LAMINECTOMY POSTERIOR LUMBAR FACETECTOMY & FORAMINOTOMY W/DECOMP N/A 09/08/2016  Procedure: LAMINECTOMY, FACETECTOMY AND FORAMINOTOMY; Surgeon: Ricard Dillon, MD; Location: [place]; Service: Neurosurgery; Laterality: N/A;  . LAPAROSCOPIC TUBAL LIGATION  open not laproscopic  . left hip replacement Left 12/10/2011  [facility]  . NEPHRECTOMY Right 1986  . Reverse left total shoulder arthroplasty Left 07/26/2018  Dr.Tashanti Dalporto  . Reverse right total shoulder arthroplasty Right 11/04/2017  Dr.Rayansh Herbst  . Right hip bipolar hemiarthroplasty. Right 11/01/2016  Dr. Roland Rack  . Right total knee replacement Right 07/13/13  . TONSILLECTOMY 1960  . TUBAL LIGATION   Family History:  . Stroke Mother  . Heart failure Father  . Heart disease Sister  . Diabetes type II Sister  . High blood pressure (Hypertension) Sister  . Myocardial Infarction (Heart attack) Brother  . Heart disease Sister  . Lung cancer Sister  . Aortic dissection Brother  . Suicidality Brother  . Lung cancer Other  . Breast cancer Other  . Anesthesia problems Neg Hx  . Malignant hypertension Neg Hx  . Glaucoma Neg Hx  . Macular degeneration Neg Hx   Social History:   Socioeconomic History:  Marland Kitchen Marital status: Widowed  Spouse name: Not on file  . Number of  children: Not on file  . Years of education: Not on file  . Highest education level: Not on file  Occupational History  . Occupation: Retired  Tobacco Use  . Smoking status: Former Smoker  Years: 30.00  Types: Cigarettes  Quit date: 02/25/1975  Years since quitting: 44.8  . Smokeless tobacco: Never Used  Vaping Use  . Vaping Use: Never used  Substance and Sexual Activity  . Alcohol use: No  Alcohol/week: 0.0 standard drinks  . Drug use: No  . Sexual activity: Defer  Other Topics Concern  . Not on file  Social History Narrative  . Not on file   Social Determinants of Health:   Financial Resource Strain: Not on file  Food Insecurity: Not on file  Transportation Needs: Not on file   Review of Systems:  A comprehensive 14 point ROS was performed, reviewed, and the pertinent orthopaedic findings are documented in the HPI.  Physical Exam: Vitals:  12/14/19 1125  BP: 116/62  Weight: 74.4 kg (164 lb)  Height: 156.2 cm (5' 1.5")  PainSc: 5  PainLoc: Shoulder   General:  Well developed, well nourished, no apparent distress, normal affect, normal gait with no antalgic component.   HEENT: Head normocephalic, atraumatic, PERRL.   Abdomen: Soft, non tender, non distended, Bowel sounds present.  Heart: Examination of the heart reveals regular, rate, and rhythm. There is no murmur noted on ascultation. There is a normal apical pulse.  Lungs: Lungs are clear to auscultation. There is no wheeze, rhonchi, or crackles. There is normal expansion of bilateral chest walls.   Left shoulder exam: On inspection, her surgical incision again is well-healed and without evidence for infection. No swelling, erythema, ecchymosis, abrasions, or other skin abnormalities are identified. She experiences mild-moderate tenderness to palpation along the scapular spine near the posterior acromion. Again obvious painful motion is noted at this site with attempted active motion of the left upper  extremity. Actively, she is able to forward flex to 65 degrees, abduct to 50 degrees, and internally rotate to her left buttock. She demonstrated 3/5 strength with resisted internal and external rotation as well as with resisted abduction with her arm at her side. She experiences more severe pain with attempted resisted strength testing in all planes. She remains neurovascularly intact to the left upper extremity and hand.  X-rays/MRI/Lab data:  A recent CT scan of the left shoulder/scapula is available for review. By report, the scan demonstrates evidence of a "left acromial stress fracture with 5 mm of diastases at the fracture site at approximate 7 mm of posterior displacement. Fracture margins are corticated." Both the films and report were reviewed by myself and discussed with the patient.  Assessment: . Closed displaced fracture of acromial process of left scapula with nonunion. . Status post left reverse total shoulder replacement.   Plan: 1. Risks, benefits, complications of a left scapula ORIF for nonunion of the acromial process has been discussed with the patient. Patient has agreed and consented to procedure with Dr. Roland Rack on 12/21/2019.   H&P reviewed and patient re-examined. No changes.

## 2019-12-21 NOTE — Anesthesia Postprocedure Evaluation (Signed)
Anesthesia Post Note  Patient: Kerri Carter  Procedure(s) Performed: OPEN REDUCTION INTERNAL FIXATION (ORIF) OF LEFT SCAPULAR NONUNION WITH BONE GRAFT (Left Shoulder)  Patient location during evaluation: PACU Anesthesia Type: General Level of consciousness: awake and alert Pain management: pain level controlled Vital Signs Assessment: post-procedure vital signs reviewed and stable Respiratory status: spontaneous breathing, nonlabored ventilation, respiratory function stable and patient connected to nasal cannula oxygen Cardiovascular status: blood pressure returned to baseline and stable Postop Assessment: no apparent nausea or vomiting Anesthetic complications: no   No complications documented.   Last Vitals:  Vitals:   12/21/19 1634 12/21/19 1646  BP: (!) 145/57   Pulse: 65 65  Resp: 14 15  Temp:    SpO2: 98% 93%    Last Pain:  Vitals:   12/21/19 1634  TempSrc:   PainSc: 0-No pain                 Molli Barrows

## 2019-12-21 NOTE — Anesthesia Preprocedure Evaluation (Signed)
Anesthesia Evaluation  Patient identified by MRN, date of birth, ID band Patient awake    Reviewed: Allergy & Precautions, H&P , NPO status , Patient's Chart, lab work & pertinent test results, reviewed documented beta blocker date and time   History of Anesthesia Complications Negative for: history of anesthetic complications  Airway Mallampati: III  TM Distance: >3 FB Neck ROM: full    Dental  (+) Edentulous Upper, Edentulous Lower, Upper Dentures, Lower Dentures, Dental Advidsory Given   Pulmonary shortness of breath and with exertion, asthma , sleep apnea , COPD,  COPD inhaler, neg recent URI, former smoker,           Cardiovascular Exercise Tolerance: Good hypertension, (-) angina+ CAD and + Peripheral Vascular Disease  (-) Past MI, (-) Cardiac Stents and (-) CABG (-) dysrhythmias + Valvular Problems/Murmurs      Neuro/Psych PSYCHIATRIC DISORDERS Anxiety Depression Bipolar Disorder negative neurological ROS     GI/Hepatic Neg liver ROS, PUD, GERD  ,  Endo/Other  negative endocrine ROS  Renal/GU CRFRenal disease  negative genitourinary   Musculoskeletal   Abdominal   Peds  Hematology negative hematology ROS (+)   Anesthesia Other Findings Past Medical History: No date: Anxiety No date: Asthma No date: Cataract     Comment:  s/p b/l repair  No date: Chicken pox No date: CKD (chronic kidney disease) No date: CKD (chronic kidney disease), stage III (HCC)     Comment:  a. s/p R nephrectomy. No date: Conversion disorder No date: COPD (chronic obstructive pulmonary disease) (HCC) No date: Depression No date: Essential hypertension No date: GERD (gastroesophageal reflux disease) No date: Hyperlipidemia No date: Inflammatory arthritis     Comment:  a. hands/carpal tunnel.  b. Low titer rheumatoid factor.              c. Negative anti-CCP antibodies. d. Plaquenil. No date: Non-Obstructive CAD     Comment:  a.  07/2009 Cath ([facility]): nonobs dzs;  b. 03/2011 Cath               [facility]): nonobs dzs. No date: Osteoarthritis     Comment:  a. Knees. 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 07/2015 Echo: EF 55-60%, Mild AI, AS, MR, and TR.   Reproductive/Obstetrics negative OB ROS                             Anesthesia Physical  Anesthesia Plan  ASA: III  Anesthesia Plan: General   Post-op Pain Management:    Induction: Intravenous  PONV Risk Score and Plan: 3 and Ondansetron, Dexamethasone and Treatment may vary due to age or medical condition  Airway Management Planned: Oral ETT  Additional Equipment:   Intra-op Plan:   Post-operative Plan: Extubation in OR  Informed Consent: I have reviewed the patients History and Physical, chart, labs and discussed the procedure including the risks, benefits and alternatives for the proposed anesthesia with the patient or authorized representative who has indicated his/her understanding and acceptance.     Dental Advisory Given  Plan Discussed with: Anesthesiologist, CRNA  and Surgeon  Anesthesia Plan Comments:         Anesthesia Quick Evaluation

## 2019-12-21 NOTE — Anesthesia Procedure Notes (Signed)
Procedure Name: Intubation Date/Time: 12/21/2019 12:42 PM Performed by: Tollie Eth, CRNA Pre-anesthesia Checklist: Patient identified, Patient being monitored, Timeout performed, Emergency Drugs available and Suction available Patient Re-evaluated:Patient Re-evaluated prior to induction Oxygen Delivery Method: Circle system utilized Preoxygenation: Pre-oxygenation with 100% oxygen Induction Type: IV induction Ventilation: Mask ventilation without difficulty Laryngoscope Size: Mac and 3 Grade View: Grade I Tube type: Oral Tube size: 7.0 mm Number of attempts: 1 Airway Equipment and Method: Stylet Placement Confirmation: ETT inserted through vocal cords under direct vision,  positive ETCO2 and breath sounds checked- equal and bilateral Secured at: 21 cm Tube secured with: Tape Dental Injury: Teeth and Oropharynx as per pre-operative assessment

## 2019-12-22 ENCOUNTER — Encounter: Payer: Self-pay | Admitting: Surgery

## 2019-12-22 LAB — BASIC METABOLIC PANEL
Anion gap: 9 (ref 5–15)
BUN: 22 mg/dL (ref 8–23)
CO2: 27 mmol/L (ref 22–32)
Calcium: 8.4 mg/dL — ABNORMAL LOW (ref 8.9–10.3)
Chloride: 102 mmol/L (ref 98–111)
Creatinine, Ser: 1.28 mg/dL — ABNORMAL HIGH (ref 0.44–1.00)
GFR, Estimated: 43 mL/min — ABNORMAL LOW (ref >60)
Glucose, Bld: 129 mg/dL — ABNORMAL HIGH (ref 70–99)
Potassium: 4.9 mmol/L (ref 3.5–5.1)
Sodium: 138 mmol/L (ref 135–145)

## 2019-12-22 LAB — CBC
HCT: 36.3 % (ref 36.0–46.0)
Hemoglobin: 11.6 g/dL — ABNORMAL LOW (ref 12.0–15.0)
MCH: 29.1 pg (ref 26.0–34.0)
MCHC: 32 g/dL (ref 30.0–36.0)
MCV: 91 fL (ref 80.0–100.0)
Platelets: 200 10*3/uL (ref 150–400)
RBC: 3.99 MIL/uL (ref 3.87–5.11)
RDW: 14 % (ref 11.5–15.5)
WBC: 13.5 10*3/uL — ABNORMAL HIGH (ref 4.0–10.5)
nRBC: 0 % (ref 0.0–0.2)

## 2019-12-22 MED ORDER — CLINDAMYCIN PHOSPHATE 600 MG/50ML IV SOLN
INTRAVENOUS | Status: AC
  Administered 2019-12-22: 600 mg via INTRAVENOUS
  Filled 2019-12-22: qty 50

## 2019-12-22 MED ORDER — HYDROCODONE-ACETAMINOPHEN 5-325 MG PO TABS
1.0000 | ORAL_TABLET | ORAL | 0 refills | Status: DC | PRN
Start: 2019-12-22 — End: 2020-03-24

## 2019-12-22 MED ORDER — ACETAMINOPHEN 500 MG PO TABS
ORAL_TABLET | ORAL | Status: AC
  Administered 2019-12-22: 500 mg via ORAL
  Filled 2019-12-22: qty 1

## 2019-12-22 MED ORDER — ENOXAPARIN SODIUM 40 MG/0.4ML ~~LOC~~ SOLN: 40.0000 mg | SUBCUTANEOUS | 0 refills | Status: DC

## 2019-12-22 MED ORDER — GABAPENTIN 300 MG PO CAPS
ORAL_CAPSULE | ORAL | Status: AC
  Administered 2019-12-22: 600 mg via ORAL
  Filled 2019-12-22: qty 1

## 2019-12-22 MED ORDER — ENOXAPARIN SODIUM 40 MG/0.4ML ~~LOC~~ SOLN
SUBCUTANEOUS | Status: AC
  Administered 2019-12-22: 40 mg via SUBCUTANEOUS
  Filled 2019-12-22: qty 0.4

## 2019-12-22 MED ORDER — TRAMADOL HCL 50 MG PO TABS: 50.0000 mg | ORAL_TABLET | Freq: Four times a day (QID) | ORAL | 0 refills | Status: DC | PRN

## 2019-12-22 MED ORDER — GABAPENTIN 300 MG PO CAPS
ORAL_CAPSULE | ORAL | Status: AC
  Filled 2019-12-22: qty 1

## 2019-12-22 MED ORDER — IPRATROPIUM-ALBUTEROL 0.5-2.5 (3) MG/3ML IN SOLN
RESPIRATORY_TRACT | Status: AC
  Filled 2019-12-22: qty 3

## 2019-12-22 MED ORDER — HYDROCODONE-ACETAMINOPHEN 5-325 MG PO TABS
ORAL_TABLET | ORAL | Status: AC
  Filled 2019-12-22: qty 1

## 2019-12-22 NOTE — Discharge Instructions (Signed)
Diet: As you were doing prior to hospitalization  ? ?Shower:  May shower but keep the wounds dry, use an occlusive plastic wrap, NO SOAKING IN TUB.  If the bandage gets wet, change with a clean dry gauze. ? ?Dressing:  You may change your dressing as needed. Change the dressing with sterile gauze dressing.   ? ?Activity:  Increase activity slowly as tolerated, but follow the weight bearing instructions below.  No lifting or driving for 6 weeks. ? ?Weight Bearing:   Non-weightbearing to the left shoulder ? ?To prevent constipation: you may use a stool softener such as - ? ?Colace (over the counter) 100 mg by mouth twice a day  ?Drink plenty of fluids (prune juice may be helpful) and high fiber foods ?Miralax (over the counter) for constipation as needed.   ? ?Itching:  If you experience itching with your medications, try taking only a single pain pill, or even half a pain pill at a time.  You may take up to 10 pain pills per day, and you can also use benadryl over the counter for itching or also to help with sleep.  ? ?Precautions:  If you experience chest pain or shortness of breath - call 911 immediately for transfer to the hospital emergency department!! ? ?If you develop a fever greater that 101 F, purulent drainage from wound, increased redness or drainage from wound, or calf pain-Call [facility]                                              ?Follow- Up Appointment:  Please call for an appointment to be seen in 2 weeks at [facility]  ?

## 2019-12-22 NOTE — Evaluation (Signed)
Occupational Therapy Evaluation Patient Details Name: Kerri Carter MRN: [id number] DOB: [birth year] Today's Date: 12/22/2019    History of Present Illness Kerri Carter is a 77yoF who comes to [facility] 11/18 for surgical fixation of Left acromion stress fracture s/p reverse TSA 16 weeks ago. s/p ORIF of L scapular non union with bone graft 12/21/19.   Clinical Impression   Kerri Carter was seen for OT evaluation this date POD#1 from above surgery. Prior to hospital admission, pt was MOD I for mobility using RW and on 2L O2 at night. Pt lives with grandson who works full time and his wife cares for their 50mo old - unable to assist pt. Pt reports her husband has passed since her prior surgery and she no longer has adequate assistance at home. Pt presents to acute OT demonstrating impaired ADL performance and functional mobility 2/2 decreased safety awareness, functional balance/ROM deficits, and decreased activity tolerance. Pt currently requires  MAX A don/doff gown, sling, and polar care seated EOB - assist to maintain LUE NWBing. MOD A don B socks seated EOB. MIN A + HHA for SPT. Pt instructed in polar care mgt, sling/immobilizer mgt, functional application of LUE NWBing precautions, adaptive strategies for bathing/dressing/toileting/grooming, positioning and considerations for sleep, and home/routines modifications. OT adjusted sling/immobilizer and polar care to improve comfort, optimize positioning, and to maximize skin integrity/safety. Pt verbalized understanding of all education/training provided. Pt would benefit from skilled OT to address noted impairments and functional limitations (see below for any additional details) in order to maximize safety and independence while minimizing falls risk and caregiver burden. Upon hospital discharge, recommend STR to maximize pt safety and return to PLOF.     Follow Up Recommendations  SNF    Equipment Recommendations  None recommended by OT     Recommendations for Other Services       Precautions / Restrictions Precautions Precautions: Fall Restrictions Weight Bearing Restrictions: Yes LUE Weight Bearing: Non weight bearing      Mobility Bed Mobility Overal bed mobility: Needs Assistance Bed Mobility: Supine to Sit     Supine to sit: Supervision     General bed mobility comments: VCs for LUE NWBing pcns     Transfers Overall transfer level: Needs assistance Equipment used: 1 person hand held assist Transfers: Sit to/from Stand;Stand Pivot Transfers Sit to Stand: Min assist Stand pivot transfers: Min assist            Balance Overall balance assessment: Needs assistance Sitting-balance support: No upper extremity supported;Feet supported Sitting balance-Leahy Scale: Good     Standing balance support: Single extremity supported;During functional activity Standing balance-Leahy Scale: Poor        ADL either performed or assessed with clinical judgement   ADL Overall ADL's : Needs assistance/impaired      General ADL Comments: MAX A don/doff gown, sling, and polar care seated EOB - assist to maintain LUE NWBing. MOD A don B socks seated EOB. MIN A + HHA for SPT                   Pertinent Vitals/Pain Pain Assessment: No/denies pain     Hand Dominance Right   Extremity/Trunk Assessment Upper Extremity Assessment Upper Extremity Assessment: LUE deficits/detail (RUE WFL ) LUE: Unable to fully assess due to immobilization   Lower Extremity Assessment Lower Extremity Assessment: Overall WFL for tasks assessed       Communication Communication Communication: No difficulties   Cognition Arousal/Alertness: Awake/alert Behavior During Therapy: [facility] for  tasks assessed/performed Overall Cognitive Status: Within Functional Limits for tasks assessed      General Comments  SpO2 93% on RA t/o session     Exercises Exercises: Other exercises Other Exercises Other Exercises: Pt educated re:  OT role, DME recs, d/c recs, falls prevention, ECS, polar care mgmt, functional application of NWBing pcns, adapted dressing techniques Other Exercises: LBD, don/doff gown/sling/polar care, sup>sit, sit<>stand, SPT, sitting/standing balance/tolerance   Shoulder Instructions      Home Living Family/patient expects to be discharged to:: Private residence Living Arrangements: Other relatives Available Help at Discharge: Family;Available PRN/intermittently Type of Home: House Home Access: Ramped entrance     Home Layout: One level     Bathroom Shower/Tub: Teacher, early years/pre: Handicapped height     Home Equipment: Environmental consultant - 2 wheels;Shower seat;Grab bars - tub/shower;Grab bars - toilet   Additional Comments: grandson works full time and his wife cares for their 25mo old - unable to assist pt      Prior Functioning/Environment Level of Independence: Independent with assistive device(s)        Comments: MOD I mobility using RW         OT Problem List: Decreased range of motion;Decreased activity tolerance;Impaired balance (sitting and/or standing);Decreased safety awareness;Impaired UE functional use      OT Treatment/Interventions: Self-care/ADL training;Therapeutic exercise;Energy conservation;DME and/or AE instruction;Therapeutic activities;Patient/family education;Balance training    OT Goals(Current goals can be found in the care plan section) Acute Rehab OT Goals Patient Stated Goal: to return to PLOF  OT Goal Formulation: With patient Time For Goal Achievement: 01/05/20 Potential to Achieve Goals: Good ADL Goals Pt Will Perform Grooming: with set-up;standing;with supervision Pt Will Perform Upper Body Dressing: with min assist;sitting Pt Will Transfer to Toilet: with min guard assist;ambulating;bedside commode (c LRAD PRN)  OT Frequency: Min 1X/week   Barriers to D/C: Decreased caregiver support             AM-PAC OT "6 Clicks" Daily Activity      Outcome Measure Help from another person eating meals?: A Little Help from another person taking care of personal grooming?: A Little Help from another person toileting, which includes using toliet, bedpan, or urinal?: A Lot Help from another person bathing (including washing, rinsing, drying)?: A Lot Help from another person to put on and taking off regular upper body clothing?: A Little Help from another person to put on and taking off regular lower body clothing?: A Lot 6 Click Score: 15   End of Session Nurse Communication: Mobility status  Activity Tolerance: Patient tolerated treatment well Patient left: with call bell/phone within reach;in chair;with nursing/sitter in room  OT Visit Diagnosis: Other abnormalities of gait and mobility (R26.89)                Time: 1010-1034 OT Time Calculation (min): 24 min Charges:  OT General Charges $OT Visit: 1 Visit OT Evaluation $OT Eval Low Complexity: 1 Low OT Treatments $Self Care/Home Management : 23-37 mins  Dessie Coma, M.S. OTR/L  12/22/19, 12:45 PM  ascom [phone]

## 2019-12-22 NOTE — NC FL2 (Signed)
[place] LEVEL OF CARE SCREENING TOOL     IDENTIFICATION  Patient Name: Kerri Carter Birthdate: Dec 30, 1940 Sex: female Admission Date (Current Location): 12/21/2019  [place] and [place] Number:  Engineering geologist and Address:  [facility], [address], [place], [place] [address]      Provider Number: [id number]  Attending Physician Name and Address:  Corky Mull, MD  Relative Name and Phone Number:  Brostrom,Carrie (Granddaughter) [phone] or Adraine Biffle (son) [phone]    Current Level of Care: Hospital Recommended Level of Care: [place] Prior Approval Number:    Date Approved/Denied:   PASRR Number: [id number]  Discharge Plan: SNF    Current Diagnoses: Patient Active Problem List   Diagnosis Date Noted  . Acromial process of scapula fracture 12/21/2019  . NSIP (nonspecific interstitial pneumonitis) ([place]) 12/08/2019  . Iliopsoas bursitis of right hip 08/29/2019  . Chronic left shoulder pain 08/29/2019  . Overweight (BMI 25.0-29.9) 08/29/2019  . Hypertriglyceridemia 07/24/2019  . Bipolar disorder, in full remission, most recent episode depressed ([place]) 07/13/2019  . Essential hypertension 06/16/2019  . Chronic pain 06/16/2019  . Lymphedema 06/05/2019  . Chronic venous insufficiency 05/25/2019  . PAD (peripheral artery disease) ([place]) 05/25/2019  . Leg edema 05/02/2019  . Episodic mood disorder ([place]) 04/18/2019  . Complicated grief 03/47/4259  . At high risk for falls 03/16/2019  . Pelvic mass in female 03/16/2019  . Compression fracture of L4 vertebra (HCC) 02/21/2019  . Collapsed vertebra, not elsewhere classified, lumbar region, initial encounter for fracture ([place]) 02/21/2019  . Anxiety 01/20/2019  . Pain due to onychomycosis of toenails of both feet 12/05/2018  . Hav (hallux abducto valgus), unspecified laterality 12/05/2018  . Closed fracture of acromial process of scapula 10/28/2018  .  Status post reverse arthroplasty of shoulder, left 07/26/2018  . Interstitial pulmonary disease ([place]) 07/07/2018  . Fatigue 07/07/2018  . Avascular necrosis of left shoulder due to adverse effect of steroid therapy ([place]) 01/20/2018  . Other shoulder lesions, left shoulder 12/20/2017  . Status post reverse total shoulder replacement, right 11/04/2017  . Leukocytosis 10/19/2017  . Allergic rhinitis 09/28/2017  . Rotator cuff tendinitis, right 07/26/2017  . Strain of right hip 02/26/2017  . History of fracture of left hip 01/15/2017  . Status post lumbar spinal fusion 01/15/2017  . Dislocation of hip joint prosthesis ([place]) 01/08/2017  . Status post hip hemiarthroplasty 01/08/2017  . Leg swelling 12/15/2016  . Age-related osteoporosis without current pathological fracture 11/05/2016  . Hip fracture ([place]) 10/31/2016  . Polyneuropathy 10/28/2016  . Left foot pain 10/19/2016  . Spinal stenosis of lumbar region 09/15/2016  . Osteoporosis 08/27/2016  . Drug-induced osteoporosis 08/27/2016  . Adnexal mass 08/25/2016  . Prediabetes 08/25/2016  . Coronary atherosclerosis 08/25/2016  . History of syncope 08/25/2016  . Hypokalemia 08/25/2016  . Mixed bipolar I disorder ([place]) 10/09/2015  . Stage 3b chronic kidney disease ([place])   . Conversion disorder 08/04/2015  . Hyperlipidemia 07/17/2015  . 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 Macular degeneration 07/17/2015  . Insomnia 07/17/2015  . Rheumatoid arthritis ([place]) 12/05/2014  . Other specified postprocedural states 07/20/2013  . Anxiety and depression 06/13/2013  . Osteoarthritis of knee 06/07/2013  . Cough 05/02/2012  . Valvular heart disease 10/13/2011  . Asthma-COPD overlap syndrome ([place]) 09/09/2011  . OSA (obstructive sleep apnea) 09/09/2011  . Nocturnal hypoxemia 09/09/2011  . Gastroesophageal reflux disease 02/24/2011  . Single kidney 02/24/2011  .  H/O cardiac catheterization 02/24/2011  . Renal artery  stenosis (HCC) 02/24/2011    Orientation RESPIRATION BLADDER Height & Weight     Self, Time, Situation, Place  Normal Continent Weight: 166 lb 0.1 oz (75.3 kg) Height:  5' 1.5" (156.2 cm)  BEHAVIORAL SYMPTOMS/MOOD NEUROLOGICAL BOWEL NUTRITION STATUS      Continent Diet  AMBULATORY STATUS COMMUNICATION OF NEEDS Skin   Limited Assist Verbally Normal                       Personal Care Assistance Level of Assistance  Bathing, Feeding, Dressing, Total care Bathing Assistance: Limited assistance Feeding assistance: Independent Dressing Assistance: Limited assistance Total Care Assistance: Limited assistance   Functional Limitations Info  Sight, Hearing, Speech Sight Info: Adequate Hearing Info: Adequate Speech Info: Adequate    SPECIAL CARE FACTORS FREQUENCY   PT  OT 5X per week  5X per week    Contractures Contractures Info: Not present    Additional Factors Info              Current Medications (12/22/2019):  This is the current hospital active medication list Current Facility-Administered Medications  Medication Dose Route Frequency Provider Last Rate Last Admin  . acetaminophen (TYLENOL) tablet 325-650 mg  325-650 mg Oral Q6H PRN Poggi, Marshall Cork, MD      . albuterol (VENTOLIN HFA) 108 (90 Base) MCG/ACT inhaler 2 puff  2 puff Inhalation Q6H PRN Poggi, Marshall Cork, MD      . ALPRAZolam Duanne Moron) tablet 0.25 mg  0.25 mg Oral QHS Poggi, Marshall Cork, MD   0.25 mg at 12/21/19 2116  . amLODipine (NORVASC) tablet 2.5 mg  2.5 mg Oral Daily Poggi, Marshall Cork, MD   2.5 mg at 12/22/19 0916  . bisacodyl (DULCOLAX) suppository 10 mg  10 mg Rectal Daily PRN Poggi, Marshall Cork, MD      . budesonide (PULMICORT) nebulizer solution 0.25 mg  0.25 mg Nebulization Daily Poggi, Marshall Cork, MD   0.25 mg at 12/22/19 0931  . cholecalciferol (VITAMIN D) tablet 4,000 Units  4,000 Units Oral Daily Poggi, Marshall Cork, MD   4,000 Units at 12/22/19 [phone]  . dicyclomine (BENTYL) capsule 20 mg  20 mg Oral BID AC & HS Poggi,  Marshall Cork, MD   20 mg at 12/22/19 0742  . diphenhydrAMINE (BENADRYL) 12.5 MG/5ML elixir 12.5-25 mg  12.5-25 mg Oral Q4H PRN Poggi, Marshall Cork, MD      . docusate sodium (COLACE) capsule 100 mg  100 mg Oral BID Corky Mull, MD   100 mg at 12/22/19 0923  . enoxaparin (LOVENOX) injection 40 mg  40 mg Subcutaneous Q24H Poggi, Marshall Cork, MD   40 mg at 12/22/19 0929  . escitalopram (LEXAPRO) tablet 10 mg  10 mg Oral Daily Poggi, Marshall Cork, MD   10 mg at 12/22/19 0921  . fluticasone (FLONASE) 50 MCG/ACT nasal spray 2 spray  2 spray Each Nare Daily Poggi, Marshall Cork, MD   2 spray at 12/22/19 0929  . gabapentin (NEURONTIN) 300 MG capsule           . gabapentin (NEURONTIN) capsule 600 mg  600 mg Oral BID Poggi, Marshall Cork, MD   600 mg at 12/22/19 0926  . HYDROcodone-acetaminophen (NORCO/VICODIN) 5-325 MG per tablet 1-2 tablet  1-2 tablet Oral Q4H PRN Poggi, Marshall Cork, MD   1 tablet at 12/22/19 [phone]  . HYDROcodone-acetaminophen (NORCO/VICODIN) 5-325 MG per tablet           .  ipratropium-albuterol (DUONEB) 0.5-2.5 (3) MG/3ML nebulizer solution 3 mL  3 mL Nebulization TID PRN Poggi, Marshall Cork, MD   3 mL at 12/22/19 0946  . lactated ringers infusion   Intravenous Continuous Poggi, Marshall Cork, MD      . lamoTRIgine (LAMICTAL) tablet 100 mg  100 mg Oral BID Corky Mull, MD   100 mg at 12/22/19 0919  . leflunomide (ARAVA) tablet 20 mg  20 mg Oral Daily Poggi, Marshall Cork, MD   20 mg at 12/22/19 0918  . magnesium hydroxide (MILK OF MAGNESIA) suspension 30 mL  30 mL Oral Daily PRN Poggi, Marshall Cork, MD      . metoCLOPramide (REGLAN) tablet 5-10 mg  5-10 mg Oral Q8H PRN Poggi, Marshall Cork, MD       Or  . metoCLOPramide (REGLAN) injection 5-10 mg  5-10 mg Intravenous Q8H PRN Poggi, Marshall Cork, MD      . mirabegron ER [facility]) tablet 25 mg  25 mg Oral QHS Corky Mull, MD   25 mg at 12/21/19 2120  . montelukast (SINGULAIR) tablet 10 mg  10 mg Oral QHS Poggi, Marshall Cork, MD      . morphine 4 MG/ML injection 0.52-1 mg  0.52-1 mg Intravenous Q2H PRN Poggi, Marshall Cork,  MD   1 mg at 12/21/19 2026  . multivitamin-lutein (OCUVITE-LUTEIN) capsule 1 capsule  1 capsule Oral Daily Poggi, Marshall Cork, MD   1 capsule at 12/22/19 0919  . nebivolol (BYSTOLIC) tablet 5 mg  5 mg Oral BID AC & HS Poggi, Marshall Cork, MD   5 mg at 12/22/19 0753  . ondansetron (ZOFRAN) tablet 4 mg  4 mg Oral Q6H PRN Poggi, Marshall Cork, MD       Or  . ondansetron (ZOFRAN) injection 4 mg  4 mg Intravenous Q6H PRN Poggi, Marshall Cork, MD      . pantoprazole (PROTONIX) EC tablet 40 mg  40 mg Oral BID Corky Mull, MD   40 mg at 12/22/19 0917  . pravastatin (PRAVACHOL) tablet 20 mg  20 mg Oral q1800 Poggi, Marshall Cork, MD   20 mg at 12/21/19 1828  . predniSONE (DELTASONE) tablet 7.5 mg  7.5 mg Oral Q breakfast Poggi, Marshall Cork, MD   7.5 mg at 12/22/19 0741  . QUEtiapine (SEROQUEL) tablet 25 mg  25 mg Oral QHS Poggi, Marshall Cork, MD   25 mg at 12/21/19 2125  . sodium phosphate (FLEET) 7-19 GM/118ML enema 1 enema  1 enema Rectal Once PRN Poggi, Marshall Cork, MD      . sucralfate (CARAFATE) tablet 1 g  1 g Oral BID Poggi, Marshall Cork, MD   1 g at 12/22/19 0920  . traMADol (ULTRAM) tablet 50 mg  50 mg Oral Q6H PRN Poggi, Marshall Cork, MD         Discharge Medications: Please see discharge summary for a list of discharge medications.  Relevant Imaging Results:  Relevant Lab Results:   Additional Information SS# [id number]  Adelene Amas, LCSWA

## 2019-12-22 NOTE — Evaluation (Signed)
Physical Therapy Evaluation Patient Details Name: Kerri Carter MRN: [id number] DOB: [birth year] Today's Date: 12/22/2019   History of Present Illness  Kerri Carter is a 76yoF who comes to [facility] 11/18 for surgical fixation of Left acromion stress fracture s/p fall 16 weeks ago. PMH includes TSA bilat.  Clinical Impression  Pt admitted with above diagnosis. Pt currently with functional limitations due to the deficits listed below (see "PT Problem List"). Upon entry, pt in bed, awake and agreeable to participate. The pt is alert and oriented x3, pleasant, conversational, and generally a good historian. minA for bed mobility, minGuard for transfers, minGuard for AMB with or with device due to chronic unsteadiness. Pt reports balance is worse than typical. Pt needs encouragement for QC compliance. Pt has some mild impulsivity at time and questionable safety judgment.  Functional mobility assessment demonstrates increased effort/time requirements, fair tolerance, and need for physical assistance, whereas the patient performed these at a higher level of independence PTA. Pt will benefit from skilled PT intervention to increase independence and safety with basic mobility in preparation for discharge to the venue listed below.       Follow Up Recommendations SNF;Supervision for mobility/OOB;Supervision/Assistance - 24 hour    Equipment Recommendations  None recommended by PT    Recommendations for Other Services       Precautions / Restrictions Precautions Precautions: Fall Required Braces or Orthoses: Sling Restrictions Weight Bearing Restrictions: Yes LUE Weight Bearing: Non weight bearing      Mobility  Bed Mobility Overal bed mobility: Needs Assistance Bed Mobility: Supine to Sit     Supine to sit: Min assist     General bed mobility comments: attempts with recurrent thrust, but eventually falls into supine accross the bed, able to pull self to sitting EOB with RUE hand hold     Transfers Overall transfer level: Needs assistance Equipment used: 1 person hand held assist;Quad cane Transfers: Sit to/from Stand Sit to Stand: Min guard Stand pivot transfers: Min guard          Ambulation/Gait Ambulation/Gait assistance: Min guard Gait Distance (Feet): 65 Feet Assistive device: 1 person hand held assist;Quad cane Gait Pattern/deviations: Step-to pattern;Ataxic     General Gait Details: mild unsteadiness, decreased trunk movement during gait.  Stairs            Wheelchair Mobility    Modified Rankin (Stroke Patients Only)       Balance Overall balance assessment: History of Falls;Mild deficits observed, not formally tested Sitting-balance support: Feet supported Sitting balance-Leahy Scale: Good     Standing balance support: Single extremity supported;During functional activity Standing balance-Leahy Scale: Poor                               Pertinent Vitals/Pain Pain Assessment:  (only when trying to move LUE)    Home Living Family/patient expects to be discharged to:: Private residence Living Arrangements: Other relatives Available Help at Discharge: Family;Available PRN/intermittently Type of Home: House Home Access: Ramped entrance     Home Layout: One level Home Equipment: Walker - 2 wheels;Shower seat;Grab bars - tub/shower;Grab bars - toilet;Cane - quad Additional Comments: grandson works full time and his wife cares for their 84mo old - unable to assist pt    Prior Function Level of Independence: Needs assistance   Gait / Transfers Assistance Needed: household AMB without device since Left scap fracture July; RW used between Jan and July due to  increased falls.  ADL's / Homemaking Assistance Needed: modI        Hand Dominance   Dominant Hand: Right    Extremity/Trunk Assessment   Upper Extremity Assessment Upper Extremity Assessment: LUE deficits/detail (RUE WFL ) LUE: Unable to fully assess due  to immobilization    Lower Extremity Assessment Lower Extremity Assessment: Overall WFL for tasks assessed       Communication   Communication: No difficulties  Cognition Arousal/Alertness: Awake/alert Behavior During Therapy: WFL for tasks assessed/performed Overall Cognitive Status: Within Functional Limits for tasks assessed                                        General Comments General comments (skin integrity, edema, etc.): SpO2 93% on RA t/o session     Exercises Other Exercises Other Exercises: Pt educated re: OT role, DME recs, d/c recs, falls prevention, ECS, polar care mgmt, functional application of NWBing pcns, adapted dressing techniques Other Exercises: LBD, don/doff gown/sling/polar care, sup>sit, sit<>stand, SPT, sitting/standing balance/tolerance   Assessment/Plan    PT Assessment Patient needs continued PT services  PT Problem List Decreased activity tolerance;Decreased balance;Decreased mobility;Decreased knowledge of precautions;Decreased knowledge of use of DME;Decreased safety awareness;Decreased cognition       PT Treatment Interventions DME instruction;Balance training;Gait training;Stair training;Functional mobility training;Therapeutic activities;Therapeutic exercise;Patient/family education    PT Goals (Current goals can be found in the Care Plan section)  Acute Rehab PT Goals Patient Stated Goal: to return to PLOF  PT Goal Formulation: With patient Time For Goal Achievement: 01/05/20 Potential to Achieve Goals: Fair    Frequency 7X/week   Barriers to discharge        Co-evaluation               AM-PAC PT "6 Clicks" Mobility  Outcome Measure Help needed turning from your back to your side while in a flat bed without using bedrails?: A Little Help needed moving from lying on your back to sitting on the side of a flat bed without using bedrails?: A Little Help needed moving to and from a bed to a chair (including a  wheelchair)?: A Little Help needed standing up from a chair using your arms (e.g., wheelchair or bedside chair)?: A Little Help needed to walk in hospital room?: A Little Help needed climbing 3-5 steps with a railing? : A Little 6 Click Score: 18    End of Session   Activity Tolerance: Patient tolerated treatment well;Patient limited by fatigue Patient left: in chair;with family/visitor present;with call bell/phone within reach;with chair alarm set Nurse Communication: Mobility status PT Visit Diagnosis: Difficulty in walking, not elsewhere classified (R26.2);Unsteadiness on feet (R26.81);Other abnormalities of gait and mobility (R26.89);History of falling (Z91.81)    Time: 4098-1191 PT Time Calculation (min) (ACUTE ONLY): 29 min   Charges:   PT Evaluation $PT Eval Moderate Complexity: 1 Mod PT Treatments $Gait Training: 8-22 mins        3:46 PM, 12/22/19 [name], PT, DPT Physical Therapist - [facility]  [phone] ([place])    Jw Covin C 12/22/2019, 3:45 PM

## 2019-12-22 NOTE — TOC Progression Note (Addendum)
Transition of Care [facility]) - Progression Note    Patient Details  Name: MAKENSIE MULHALL MRN: [id number] Date of Birth: 04/12/1940  Transition of Care [facility]) CM/SW [place], [place] Phone Number: [phone] 12/22/2019, 10:15 AM  Clinical Narrative:      CSW spoke with patient and explained role of TOC in patient care.  Patient stated her preferred SNF was [facility] and did not want to go to [facility], [facility] or [facility].  CSW explained SNF placement process and probable timeline. Patient verbalized understanding.  PASRR# [id number], FL2 sent for signature, attending updated.        Expected Discharge Plan and Services                                                 Social Determinants of Health (SDOH) Interventions    Readmission Risk Interventions No flowsheet data found.

## 2019-12-22 NOTE — Progress Notes (Signed)
Subjective: 1 Day Post-Op Procedure(s) (LRB): OPEN REDUCTION INTERNAL FIXATION (ORIF) OF LEFT SCAPULAR NONUNION WITH BONE GRAFT (Left) Patient reports pain as mild.   Patient is well, and has had no acute complaints or problems Plan is to go Rehab after hospital stay. Negative for chest pain and shortness of breath Fever: no Gastrointestinal:Negative for nausea and vomiting  Objective: Vital signs in last 24 hours: Temp:  [97.4 F (36.3 C)-98.7 F (37.1 C)] 97.4 F (36.3 C) (11/19 0724) Pulse Rate:  [55-71] 58 (11/19 0444) Resp:  [14-22] 18 (11/19 0724) BP: (101-150)/(50-85) 129/55 (11/19 0444) SpO2:  [93 %-99 %] 97 % (11/19 0444) Weight:  [75.3 kg] 75.3 kg (11/18 0850)  Intake/Output from previous day:  Intake/Output Summary (Last 24 hours) at 12/22/2019 0752 Last data filed at 12/22/2019 0601 Gross per 24 hour  Intake 1590 ml  Output 225 ml  Net 1365 ml    Intake/Output this shift: No intake/output data recorded.  Labs: Recent Labs    12/22/19 0513  HGB 11.6*   Recent Labs    12/22/19 0513  WBC 13.5*  RBC 3.99  HCT 36.3  PLT 200   Recent Labs    12/22/19 0513  NA 138  K 4.9  CL 102  CO2 27  BUN 22  CREATININE 1.28*  GLUCOSE 129*  CALCIUM 8.4*   No results for input(s): LABPT, INR in the last 72 hours.   EXAM General - Patient is Alert, Appropriate and Oriented Extremity - ABD soft Neurovascular intact Intact pulses distally Dorsiflexion/Plantar flexion intact Incision: scant drainage No cellulitis present Dressing/Incision - blood tinged drainage Motor Function - intact, moving foot and toes well on exam.   Past Medical History:  Diagnosis Date  . Anxiety   . Asthma   . Bipolar disorder ([place])   . C. difficile diarrhea    from Abx 2010 hospitalized   . Cataract    s/p b/l repair   . Chicken pox   . CKD (chronic kidney disease)   . CKD (chronic kidney disease), stage III ([place])    a. s/p R nephrectomy./ aneurysm  . Conversion  disorder   . COPD (chronic obstructive pulmonary disease) ([place])   . Depression   . Essential hypertension   . GERD (gastroesophageal reflux disease)   . Hyperlipidemia   . ILD (interstitial lung disease) ([place])    mild; 2/2 RA diagnosis  . Inflammatory arthritis    a. hands/carpal tunnel.  b. Low titer rheumatoid factor. c. Negative anti-CCP antibodies. d. Plaquenil.  . Nocturnal hypoxemia   . Non-Obstructive CAD    a. 07/2009 Cath ([facility]): nonobs dzs;  b. 03/2011 Cath [facility]): nonobs dzs.  . Osteoarthritis    a. Knees.  Marland Kitchen PAD (peripheral artery disease) ([place])   . PUD (peptic ulcer disease)   . S/P right hip fracture    11/01/16 s/p repair  . Shoulder pain   . Sleep apnea    no cpap / minimal  . Spinal stenosis at L4-L5 level    severe with L4/L5 anterolisthesis grade 1 anterolisthesis   . Toxic maculopathy   . Valvular heart disease    a. 07/2015 Echo: EF 55-60%, Mild AI, AS, MR, and TR.    Assessment/Plan: 1 Day Post-Op Procedure(s) (LRB): OPEN REDUCTION INTERNAL FIXATION (ORIF) OF LEFT SCAPULAR NONUNION WITH BONE GRAFT (Left) Active Problems:   Acromial process of scapula fracture  Estimated body mass index is 30.86 kg/m as calculated from the following:   Height as of  this encounter: 5' 1.5" (1.562 m).   Weight as of this encounter: 75.3 kg. Advance diet Up with therapy D/C IV fluids  When tolerating po intake.  Labs reviewed this AM. Hg 11.6 this AM. Patient is intact to light touch to the left arm. Up with therapy today. Patient without any family support at home, will need to go to rehab.  Patient went to rehab following previous reverse total shoulders. Plan for discharge to rehab pending insurance approval.  DVT Prophylaxis - Lovenox and TED hose Non-weightbearing to the left arm.  Raquel Brysan Mcevoy, PA-C [facility] 12/22/2019, 7:52 AM

## 2019-12-22 NOTE — Progress Notes (Signed)
Pt transferred from observation unit in stable condition.  Pt is A/O.  VS taken.  Pt oriented to room and unit.  All bell at pt side.

## 2019-12-22 NOTE — TOC Initial Note (Addendum)
Transition of Care [facility]) - Initial/Assessment Note    Patient Details  Name: Kerri Carter MRN: [id number] Date of Birth: [birth year]  [facility]) CM/SW Contact:    Ova Freshwater Phone Number: [phone] 12/22/2019, 9:32 AM  Clinical Narrative:                  Patient preference is Radiation protection practitioner pending PT/OT evals. Stohr,Carrie (Granddaughter)  [phone], main contact.  CSW spoke with Morey Hummingbird and explained role of TOC in patient care and SNF placement process and expected timeline. Morey Hummingbird gave CSW contact information for patient's son Saylah Ketner [phone] to contact about placement process.  CSW spoke with Mr. Kellett and explained role of TOC and SNF placement process. Mr. Strother mentioned the patient would not want to be placed at [facility] or [facility].          Patient Goals and CMS Choice        Expected Discharge Plan and Services                                                Prior Living Arrangements/Services                       Activities of Daily Living Home Assistive Devices/Equipment: Dentures (specify type) ADL Screening (condition at time of admission) Patient's cognitive ability adequate to safely complete daily activities?: Yes Is the patient deaf or have difficulty hearing?: No Does the patient have difficulty seeing, even when wearing glasses/contacts?: No Does the patient have difficulty concentrating, remembering, or making decisions?: No Patient able to express need for assistance with ADLs?: Yes Does the patient have difficulty dressing or bathing?: No Independently performs ADLs?: Yes (appropriate for developmental age) Does the patient have difficulty walking or climbing stairs?: No Weakness of Legs: Both Weakness of Arms/Hands: Left  Permission Sought/Granted                  Emotional Assessment              Admission diagnosis:  Acromial process of scapula fracture  [S34.196Q] Patient Active Problem List   Diagnosis Date Noted  . Acromial process of scapula fracture 12/21/2019  . NSIP (nonspecific interstitial pneumonitis) ([place]) 12/08/2019  . Iliopsoas bursitis of right hip 08/29/2019  . Chronic left shoulder pain 08/29/2019  . Overweight (BMI 25.0-29.9) 08/29/2019  . Hypertriglyceridemia 07/24/2019  . Bipolar disorder, in full remission, most recent episode depressed ([place]) 07/13/2019  . Essential hypertension 06/16/2019  . Chronic pain 06/16/2019  . Lymphedema 06/05/2019  . Chronic venous insufficiency 05/25/2019  . PAD (peripheral artery disease) ([place]) 05/25/2019  . Leg edema 05/02/2019  . Episodic mood disorder ([place]) 04/18/2019  . Complicated grief 22/97/9892  . At high risk for falls 03/16/2019  . Pelvic mass in female 03/16/2019  . Compression fracture of L4 vertebra (HCC) 02/21/2019  . Collapsed vertebra, not elsewhere classified, lumbar region, initial encounter for fracture ([place]) 02/21/2019  . Anxiety 01/20/2019  . Pain due to onychomycosis of toenails of both feet 12/05/2018  . Hav (hallux abducto valgus), unspecified laterality 12/05/2018  . Closed fracture of acromial process of scapula 10/28/2018  . Status post reverse arthroplasty of shoulder, left 07/26/2018  . Interstitial pulmonary disease ([place]) 07/07/2018  . Fatigue 07/07/2018  . Avascular necrosis of left  shoulder due to adverse effect of steroid therapy ([place]) 01/20/2018  . Other shoulder lesions, left shoulder 12/20/2017  . Status post reverse total shoulder replacement, right 11/04/2017  . Leukocytosis 10/19/2017  . Allergic rhinitis 09/28/2017  . Rotator cuff tendinitis, right 07/26/2017  . Strain of right hip 02/26/2017  . History of fracture of left hip 01/15/2017  . Status post lumbar spinal fusion 01/15/2017  . Dislocation of hip joint prosthesis ([place]) 01/08/2017  . Status post hip hemiarthroplasty 01/08/2017  . Leg swelling 12/15/2016  . Age-related osteoporosis  without current pathological fracture 11/05/2016  . Hip fracture ([place]) 10/31/2016  . Polyneuropathy 10/28/2016  . Left foot pain 10/19/2016  . Spinal stenosis of lumbar region 09/15/2016  . Osteoporosis 08/27/2016  . Drug-induced osteoporosis 08/27/2016  . Adnexal mass 08/25/2016  . Prediabetes 08/25/2016  . Coronary atherosclerosis 08/25/2016  . History of syncope 08/25/2016  . Hypokalemia 08/25/2016  . Mixed bipolar I disorder ([place]) 10/09/2015  . Stage 3b chronic kidney disease ([place])   . Conversion disorder 08/04/2015  . Hyperlipidemia 07/17/2015  . 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 Macular degeneration 07/17/2015  . Insomnia 07/17/2015  . Rheumatoid arthritis ([place]) 12/05/2014  . Other specified postprocedural states 07/20/2013  . Anxiety and depression 06/13/2013  . Osteoarthritis of knee 06/07/2013  . Cough 05/02/2012  . Valvular heart disease 10/13/2011  . Asthma-COPD overlap syndrome ([place]) 09/09/2011  . OSA (obstructive sleep apnea) 09/09/2011  . Nocturnal hypoxemia 09/09/2011  . Gastroesophageal reflux disease 02/24/2011  . Single kidney 02/24/2011  . H/O cardiac catheterization 02/24/2011  . Renal artery stenosis ([place]) 02/24/2011   PCP:  McLean-Scocuzza, Nino Glow, MD Pharmacy:   [id number] - [place], [place] [address] [address] Phone: [phone] Fax: [phone]  [place], [address] [place] [address] Phone: [phone] Fax: [phone]     Social Determinants of Health (SDOH) Interventions    Readmission Risk Interventions No flowsheet data found.

## 2019-12-22 NOTE — Discharge Summary (Addendum)
Physician Discharge Summary  Patient ID: Kerri Carter MRN: [id number] DOB/AGE: November 09, 1940 79 y.o.  Admit date: 12/21/2019 Discharge date: 12/26/2019  Admission Diagnoses:  Acromial process of scapula fracture [S42.123A]  Discharge Diagnoses: Patient Active Problem List   Diagnosis Date Noted  . Acromial process of scapula fracture 12/21/2019  . NSIP (nonspecific interstitial pneumonitis) ([place]) 12/08/2019  . Iliopsoas bursitis of right hip 08/29/2019  . Chronic left shoulder pain 08/29/2019  . Overweight (BMI 25.0-29.9) 08/29/2019  . Hypertriglyceridemia 07/24/2019  . Bipolar disorder, in full remission, most recent episode depressed ([place]) 07/13/2019  . Essential hypertension 06/16/2019  . Chronic pain 06/16/2019  . Lymphedema 06/05/2019  . Chronic venous insufficiency 05/25/2019  . PAD (peripheral artery disease) ([place]) 05/25/2019  . Leg edema 05/02/2019  . Episodic mood disorder ([place]) 04/18/2019  . Complicated grief 25/36/6440  . At high risk for falls 03/16/2019  . Pelvic mass in female 03/16/2019  . Compression fracture of L4 vertebra (HCC) 02/21/2019  . Collapsed vertebra, not elsewhere classified, lumbar region, initial encounter for fracture ([place]) 02/21/2019  . Anxiety 01/20/2019  . Pain due to onychomycosis of toenails of both feet 12/05/2018  . Hav (hallux abducto valgus), unspecified laterality 12/05/2018  . Closed fracture of acromial process of scapula 10/28/2018  . Status post reverse arthroplasty of shoulder, left 07/26/2018  . Interstitial pulmonary disease ([place]) 07/07/2018  . Fatigue 07/07/2018  . Avascular necrosis of left shoulder due to adverse effect of steroid therapy ([place]) 01/20/2018  . Other shoulder lesions, left shoulder 12/20/2017  . Status post reverse total shoulder replacement, right 11/04/2017  . Leukocytosis 10/19/2017  . Allergic rhinitis 09/28/2017  . Rotator cuff tendinitis, right 07/26/2017  . Strain of right hip 02/26/2017  . History  of fracture of left hip 01/15/2017  . Status post lumbar spinal fusion 01/15/2017  . Dislocation of hip joint prosthesis ([place]) 01/08/2017  . Status post hip hemiarthroplasty 01/08/2017  . Leg swelling 12/15/2016  . Age-related osteoporosis without current pathological fracture 11/05/2016  . Hip fracture ([place]) 10/31/2016  . Polyneuropathy 10/28/2016  . Left foot pain 10/19/2016  . Spinal stenosis of lumbar region 09/15/2016  . Osteoporosis 08/27/2016  . Drug-induced osteoporosis 08/27/2016  . Adnexal mass 08/25/2016  . Prediabetes 08/25/2016  . Coronary atherosclerosis 08/25/2016  . History of syncope 08/25/2016  . Hypokalemia 08/25/2016  . Mixed bipolar I disorder ([place]) 10/09/2015  . Stage 3b chronic kidney disease ([place])   . Conversion disorder 08/04/2015  . Hyperlipidemia 07/17/2015  . 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 Macular degeneration 07/17/2015  . Insomnia 07/17/2015  . Rheumatoid arthritis ([place]) 12/05/2014  . Other specified postprocedural states 07/20/2013  . Anxiety and depression 06/13/2013  . Osteoarthritis of knee 06/07/2013  . Cough 05/02/2012  . Valvular heart disease 10/13/2011  . Asthma-COPD overlap syndrome ([place]) 09/09/2011  . OSA (obstructive sleep apnea) 09/09/2011  . Nocturnal hypoxemia 09/09/2011  . Gastroesophageal reflux disease 02/24/2011  . Single kidney 02/24/2011  . H/O cardiac catheterization 02/24/2011  . Renal artery stenosis ([place]) 02/24/2011   Past Medical History:  Diagnosis Date  . Anxiety   . Asthma   . Bipolar disorder ([place])   . C. difficile diarrhea    from Abx 2010 hospitalized   . Cataract    s/p b/l repair   . Chicken pox   . CKD (chronic kidney disease)   . CKD (chronic kidney disease), stage III ([place])    a. s/p R nephrectomy./ aneurysm  . Conversion  disorder   . COPD (chronic obstructive pulmonary disease) ([place])   . Depression   . Essential hypertension   . GERD (gastroesophageal reflux  disease)   . Hyperlipidemia   . ILD (interstitial lung disease) ([place])    mild; 2/2 RA diagnosis  . Inflammatory arthritis    a. hands/carpal tunnel.  b. Low titer rheumatoid factor. c. Negative anti-CCP antibodies. d. Plaquenil.  . Nocturnal hypoxemia   . Non-Obstructive CAD    a. 07/2009 Cath ([facility]): nonobs dzs;  b. 03/2011 Cath [facility]): nonobs dzs.  . Osteoarthritis    a. Knees.  Marland Kitchen PAD (peripheral artery disease) ([place])   . PUD (peptic ulcer disease)   . S/P right hip fracture    11/01/16 s/p repair  . Shoulder pain   . Sleep apnea    no cpap / minimal  . Spinal stenosis at L4-L5 level    severe with L4/L5 anterolisthesis grade 1 anterolisthesis   . Toxic maculopathy   . Valvular heart disease    a. 07/2015 Echo: EF 55-60%, Mild AI, AS, MR, and TR.   Transfusion: None.   Consultants (if any):   Discharged Condition: Improved  Hospital Course: KUULEI KLEIER is an 79 y.o. female who was admitted 12/21/2019 with a diagnosis of a closed displaced fracture of acromial process of the left scapula with non-union and went to the operating room on 12/21/2019 and underwent the above named procedures.    Surgeries: Procedure(s): OPEN REDUCTION INTERNAL FIXATION (ORIF) OF LEFT SCAPULAR NONUNION WITH BONE GRAFT on 12/21/2019 Patient tolerated the surgery well. Taken to PACU where she was stabilized and then transferred to the orthopedic floor.  Started on Lovenox 40mg  q 24 hrs. Foot pumps applied bilaterally at 80 mm. Heels elevated on bed with rolled towels. No evidence of DVT. Negative [place]. Physical therapy started on day #1 for gait training and transfer. OT started day #1 for ADL and assisted devices.  Patient's IV was removed on POD4  Implants: Acumed 8 hole anterior clavicle plate  She was given perioperative antibiotics:  Anti-infectives (From admission, onward)   Start     Dose/Rate Route Frequency Ordered Stop   12/21/19 1900  clindamycin (CLEOCIN) IVPB 600 mg        600  mg 100 mL/hr over 30 Minutes Intravenous Every 6 hours 12/21/19 1736 12/22/19 0631   12/21/19 0839  clindamycin (CLEOCIN) 900 MG/50ML IVPB       Note to Pharmacy: Sharmon Leyden   : cabinet override      12/21/19 0839 12/21/19 1245   12/21/19 0600  clindamycin (CLEOCIN) IVPB 900 mg        900 mg 100 mL/hr over 30 Minutes Intravenous On call to O.R. 12/20/19 2231 12/21/19 1254    .  She was given sequential compression devices, early ambulation, and Lovenox for DVT prophylaxis.  She benefited maximally from the hospital stay and there were no complications.    Recent vital signs:  Vitals:   12/26/19 0810 12/26/19 0832  BP:  (!) 154/52  Pulse:  68  Resp:  16  Temp:  98.1 F (36.7 C)  SpO2: 95% 96%    Recent laboratory studies:  Lab Results  Component Value Date   HGB 11.7 (L) 12/23/2019   HGB 11.6 (L) 12/22/2019   HGB 11.7 (L) 02/21/2019   Lab Results  Component Value Date   WBC 13.0 (H) 12/23/2019   PLT 210 12/23/2019   Lab Results  Component Value Date  INR 0.90 10/21/2017   Lab Results  Component Value Date   NA 141 12/24/2019   K 4.5 12/24/2019   CL 99 12/24/2019   CO2 31 12/24/2019   BUN 25 (H) 12/24/2019   CREATININE 1.28 (H) 12/24/2019   GLUCOSE 108 (H) 12/24/2019    Discharge Medications:   Allergies as of 12/26/2019      Reactions   Ceftin [cefuroxime Axetil] Anaphylaxis   Lisinopril Anaphylaxis   Sulfa Antibiotics Other (See Comments)   Face swelling   Sulfasalazine Anaphylaxis   Morphine Other (See Comments)   Per patient, low blood pressure issues that requires action to raise it back up. Can take small infrequent doses   Xarelto [rivaroxaban] Other (See Comments)   Stomach burning, bleeding, and tar in stool   Adhesive [tape] Rash   Paper tape and tega derm OK   Antihistamines, Chlorpheniramine-type Other (See Comments)   Makes pt hyper   Antivert [meclizine Hcl] Other (See Comments)   Bladder will not empty   Aspirin Other (See  Comments)   Sulfasalazine allergy cross reacts   Contrast Media [iodinated Diagnostic Agents] Rash   she is able to use betadine scrubs.   Decongestant [pseudoephedrine Hcl] Other (See Comments)   Makes pt hyper   Doxycycline Other (See Comments)   GI upset   Levaquin [levofloxacin In D5w] Rash   Polymyxin B Other (See Comments)   Facial rash   Tetanus Toxoids Rash, Other (See Comments)   Fever and hot to touch at injection site      Medication List    TAKE these medications   albuterol 108 (90 Base) MCG/ACT inhaler Commonly known as: Ventolin HFA Inhale 2 puffs into the lungs every 6 (six) hours as needed. What changed: reasons to take this   ALPRAZolam 0.25 MG tablet Commonly known as: Xanax Take 1 tablet (0.25 mg total) by mouth at bedtime as needed for anxiety. What changed: when to take this   amLODipine 2.5 MG tablet Commonly known as: NORVASC Take 2.5 mg by mouth daily.   benzonatate 200 MG capsule Commonly known as: TESSALON Take 1 capsule (200 mg total) by mouth 3 (three) times daily as needed for cough.   Biotin 10000 MCG Tabs Take 10,000 mcg by mouth daily.   budesonide 0.25 MG/2ML nebulizer solution Commonly known as: PULMICORT USE 1 VIAL  IN  NEBULIZER TWICE  DAILY - rinse mouth after treatment What changed: See the new instructions.   Cholecalciferol 50 MCG (2000 UT) Caps Take 4,000 Units by mouth daily.   dicyclomine 10 MG capsule Commonly known as: BENTYL TAKE 1 CAPSULE (10 MG TOTAL) BY MOUTH 4 (FOUR) TIMES DAILY - BEFORE MEALS AND AT BEDTIME. What changed:   how much to take  when to take this   enoxaparin 40 MG/0.4ML injection Commonly known as: LOVENOX Inject 0.4 mLs (40 mg total) into the skin daily.   escitalopram 10 MG tablet Commonly known as: LEXAPRO Take 1 tablet (10 mg total) by mouth daily.   famotidine 20 MG tablet Commonly known as: Pepcid Take 1 tablet (20 mg total) by mouth daily. Before breakfast or dinner    fluticasone 50 MCG/ACT nasal spray Commonly known as: Flonase Place 2 sprays into both nostrils daily. In am   gabapentin 300 MG capsule Commonly known as: NEURONTIN Take 2 capsules (600 mg total) by mouth in the morning and at bedtime.   Humira Pen 40 MG/0.4ML Pnkt Generic drug: Adalimumab Inject 40 mg into the skin  every 14 (fourteen) days.   HYDROcodone-acetaminophen 5-325 MG tablet Commonly known as: NORCO/VICODIN Take 1-2 tablets by mouth every 4 (four) hours as needed for moderate pain (pain score 4-6).   ipratropium-albuterol 0.5-2.5 (3) MG/3ML Soln Commonly known as: DUONEB Take 3 mLs by nebulization 3 (three) times daily as needed. What changed: reasons to take this   lamoTRIgine 100 MG tablet Commonly known as: LAMICTAL Take 1 tablet (100 mg total) by mouth 2 (two) times daily.   leflunomide 20 MG tablet Commonly known as: ARAVA Take 1 tablet (20 mg total) by mouth daily.   lovastatin 20 MG tablet Commonly known as: MEVACOR Take 1 tablet (20 mg total) by mouth at bedtime.   montelukast 10 MG tablet Commonly known as: SINGULAIR Take 1 tablet (10 mg total) by mouth daily.   multivitamin-lutein Caps capsule Take 1 capsule by mouth daily.   Myrbetriq 50 MG Tb24 tablet Generic drug: mirabegron ER TAKE 1 TABLET BY MOUTH EVERYDAY AT BEDTIME What changed: See the new instructions.   nebivolol 10 MG tablet Commonly known as: Bystolic Take 0.5 tablets (5 mg total) by mouth in the morning and at bedtime.   pantoprazole 40 MG tablet Commonly known as: PROTONIX Take 1 tablet (40 mg total) by mouth 2 (two) times daily. 30 minutes before food. Note reduction in frequency   predniSONE 5 MG tablet Commonly known as: DELTASONE Take 1.5 tablets (7.5 mg total) by mouth daily with breakfast.   QUEtiapine 25 MG tablet Commonly known as: SEROQUEL Take 1 tablet (25 mg total) by mouth at bedtime.   Sleep Aid 50 MG Caps Generic drug: diphenhydrAMINE HCl (Sleep) Take  by mouth.   sucralfate 1 g tablet Commonly known as: CARAFATE Take 1 tablet (1 g total) by mouth 2 (two) times daily.   traMADol 50 MG tablet Commonly known as: ULTRAM Take 1 tablet (50 mg total) by mouth every 6 (six) hours as needed for moderate pain.      Diagnostic Studies: DG Shoulder Left  Result Date: 12/21/2019 CLINICAL DATA:  Nonunion of scapula, internal fixation EXAM: LEFT SHOULDER - 2+ VIEW; DG C-ARM 1-60 MIN COMPARISON:  CT shoulder 09/04/2019 FINDINGS: Intraoperative spot images are submitted demonstrating internal fixation across the ununited scapular fracture at the base of the scaphoid. No visible complicating feature. IMPRESSION: Internal fixation.  No visible complicating feature. Electronically Signed   By: Rolm Baptise M.D.   On: 12/21/2019 17:29   DG Shoulder Left  Result Date: 12/21/2019 CLINICAL DATA:  ORIF left scapula EXAM: LEFT SHOULDER - 2+ VIEW COMPARISON:  None. FINDINGS: Prior left shoulder replacement. Plate and screw fixation noted within the left scapula and acromion. No hardware complicating feature. IMPRESSION: Plate and screw fixation in the left scapula. No visible complicating feature. Electronically Signed   By: Rolm Baptise M.D.   On: 12/21/2019 17:14   DG C-Arm 1-60 Min  Result Date: 12/21/2019 CLINICAL DATA:  Nonunion of scapula, internal fixation EXAM: LEFT SHOULDER - 2+ VIEW; DG C-ARM 1-60 MIN COMPARISON:  CT shoulder 09/04/2019 FINDINGS: Intraoperative spot images are submitted demonstrating internal fixation across the ununited scapular fracture at the base of the scaphoid. No visible complicating feature. IMPRESSION: Internal fixation.  No visible complicating feature. Electronically Signed   By: Rolm Baptise M.D.   On: 12/21/2019 17:29   Disposition: Discharge disposition: 01-Home or Self Care     Plan for discharge home today with HHPT.   Contact information for follow-up providers    Damaris Hippo  [place], PA-C On 01/01/2020.    Specialty: Physician Assistant Why: Staple removal.;  @ 2:15 pm Contact information: [place] [address] [phone]            Contact information for after-discharge care    [facility].   Service: Skilled Nursing Contact information: [place] [phone]                 Signed: Judson Roch PA-C 12/26/2019, 10:33 AM

## 2019-12-23 LAB — CBC
HCT: 36.4 % (ref 36.0–46.0)
Hemoglobin: 11.7 g/dL — ABNORMAL LOW (ref 12.0–15.0)
MCH: 29.3 pg (ref 26.0–34.0)
MCHC: 32.1 g/dL (ref 30.0–36.0)
MCV: 91 fL (ref 80.0–100.0)
Platelets: 210 10*3/uL (ref 150–400)
RBC: 4 MIL/uL (ref 3.87–5.11)
RDW: 14.3 % (ref 11.5–15.5)
WBC: 13 10*3/uL — ABNORMAL HIGH (ref 4.0–10.5)
nRBC: 0 % (ref 0.0–0.2)

## 2019-12-23 LAB — BASIC METABOLIC PANEL
Anion gap: 8 (ref 5–15)
BUN: 23 mg/dL (ref 8–23)
CO2: 31 mmol/L (ref 22–32)
Calcium: 8.8 mg/dL — ABNORMAL LOW (ref 8.9–10.3)
Chloride: 102 mmol/L (ref 98–111)
Creatinine, Ser: 1.29 mg/dL — ABNORMAL HIGH (ref 0.44–1.00)
GFR, Estimated: 43 mL/min — ABNORMAL LOW (ref >60)
Glucose, Bld: 114 mg/dL — ABNORMAL HIGH (ref 70–99)
Potassium: 4.8 mmol/L (ref 3.5–5.1)
Sodium: 141 mmol/L (ref 135–145)

## 2019-12-23 MED ORDER — MELATONIN 5 MG PO TABS
10.0000 mg | ORAL_TABLET | Freq: Every evening | ORAL | Status: DC | PRN
  Administered 2019-12-23 – 2019-12-25 (×4): 10 mg via ORAL
  Filled 2019-12-23 (×4): qty 2

## 2019-12-23 NOTE — Progress Notes (Addendum)
Physical Therapy Treatment Patient Details Name: Kerri Carter MRN: [id number] DOB: February 08, 1940 Today's Date: 12/23/2019    History of Present Illness Kerri Carter is a 27yoF who comes to [facility] 11/18 for surgical fixation of Left acromion stress fracture s/p fall 16 weeks ago. PMH includes TSA bilat.    PT Comments    Pt is pleasant & agreeable to tx, eager to sit up in recliner. Pt completes bed mobility with supervision on this date but with [facility] elevated & use of bed rails when getting out on R side. Pt requires CGA for transfers & min assist for gait with large base quad cane for household distances (60 ft) with pt reporting she cannot go farther at this time. Pt also notes fatigue after gait & requests to rest. Pt eager to d/c to [facility]. Will continue to follow pt acutely to focus on gait training & balance.     Follow Up Recommendations  SNF;Supervision for mobility/OOB;Supervision/Assistance - 24 hour     Equipment Recommendations  None recommended by PT    Recommendations for Other Services       Precautions / Restrictions Precautions Precautions: Fall Required Braces or Orthoses: Sling Restrictions Weight Bearing Restrictions: Yes LUE Weight Bearing: Non weight bearing    Mobility  Bed Mobility Overal bed mobility: Needs Assistance Bed Mobility: Supine to Sit     Supine to sit: Supervision;+2 for safety/equipment;HOB elevated     General bed mobility comments: utilizes bed rails  Transfers Overall transfer level: Needs assistance Equipment used: Quad cane Transfers: Sit to/from [employer] to Stand: Min guard Stand pivot transfers: Min guard          Ambulation/Gait Ambulation/Gait assistance: Counsellor (Feet): 60 Feet Assistive device: Quad cane (large base quad cane) Gait Pattern/deviations: Decreased step length - right;Decreased step length - left;Decreased stride length Gait velocity: decreased   General  Gait Details: appropriate gait pattern with use of QC   Stairs             Wheelchair Mobility    Modified Rankin (Stroke Patients Only)       Balance Overall balance assessment: History of Falls;Mild deficits observed, not formally tested   Sitting balance-Leahy Scale: Good     Standing balance support: Single extremity supported;During functional activity Standing balance-Leahy Scale: Poor Standing balance comment: 1UE support on quad cane, min assist for gait                            Cognition Arousal/Alertness: Awake/alert Behavior During Therapy: WFL for tasks assessed/performed Overall Cognitive Status: Within Functional Limits for tasks assessed                                        Exercises      General Comments        Pertinent Vitals/Pain Pain Assessment: No/denies pain    Home Living                      Prior Function            PT Goals (current goals can now be found in the care plan section) Acute Rehab PT Goals Patient Stated Goal: to return to PLOF  PT Goal Formulation: With patient Time For Goal Achievement: 01/05/20 Potential to Achieve Goals: Fair Progress  towards PT goals: Progressing toward goals    Frequency    7X/week      PT Plan Current plan remains appropriate    Co-evaluation              AM-PAC PT "6 Clicks" Mobility   Outcome Measure  Help needed turning from your back to your side while in a flat bed without using bedrails?: A Little Help needed moving from lying on your back to sitting on the side of a flat bed without using bedrails?: A Little   Help needed standing up from a chair using your arms (e.g., wheelchair or bedside chair)?: A Little Help needed to walk in hospital room?: A Little Help needed climbing 3-5 steps with a railing? : A Little 6 Click Score: 15    End of Session Equipment Utilized During Treatment: Gait belt Activity Tolerance:  Patient tolerated treatment well;Patient limited by fatigue Patient left: in chair;with chair alarm set;with call bell/phone within reach;with SCD's reapplied (polar care on LUE) Nurse Communication: Mobility status PT Visit Diagnosis: Difficulty in walking, not elsewhere classified (R26.2);Unsteadiness on feet (R26.81);Other abnormalities of gait and mobility (R26.89);History of falling (Z91.81)     Time: 5072-2575 PT Time Calculation (min) (ACUTE ONLY): 15 min  Charges:  $Therapeutic Activity: 8-22 mins                     Lavone Nian, PT, DPT 12/23/19, 4:02 PM    [name] 12/23/2019, 3:59 PM

## 2019-12-23 NOTE — Progress Notes (Signed)
Subjective: 2 Days Post-Op Procedure(s) (LRB): OPEN REDUCTION INTERNAL FIXATION (ORIF) OF LEFT SCAPULAR NONUNION WITH BONE GRAFT (Left) Patient reports pain as mild.   Patient is well, and has had no acute complaints or problems Plan is to go Rehab after hospital stay. Negative for chest pain and shortness of breath Fever: no Gastrointestinal:Negative for nausea and vomiting  Objective: Vital signs in last 24 hours: Temp:  [97.5 F (36.4 C)-98.4 F (36.9 C)] 97.5 F (36.4 C) (11/20 0454) Pulse Rate:  [53-64] 53 (11/20 0454) Resp:  [16-17] 16 (11/20 0454) BP: (128-174)/(46-62) 136/60 (11/20 0454) SpO2:  [95 %-100 %] 100 % (11/20 0454)  Intake/Output from previous day: No intake or output data in the 24 hours ending 12/23/19 0733  Intake/Output this shift: No intake/output data recorded.  Labs: Recent Labs    12/22/19 0513 12/23/19 0631  HGB 11.6* 11.7*   Recent Labs    12/22/19 0513 12/23/19 0631  WBC 13.5* 13.0*  RBC 3.99 4.00  HCT 36.3 36.4  PLT 200 210   Recent Labs    12/22/19 0513 12/23/19 0631  NA 138 141  K 4.9 4.8  CL 102 102  CO2 27 31  BUN 22 23  CREATININE 1.28* 1.29*  GLUCOSE 129* 114*  CALCIUM 8.4* 8.8*   No results for input(s): LABPT, INR in the last 72 hours.   EXAM General - Patient is Alert, Appropriate and Oriented Extremity - ABD soft Neurovascular intact Intact pulses distally Dorsiflexion/Plantar flexion intact Incision: scant drainage No cellulitis present Dressing/Incision - blood tinged drainage Motor Function - intact, moving foot and toes well on exam.   Past Medical History:  Diagnosis Date  . Anxiety   . Asthma   . Bipolar disorder ([place])   . C. difficile diarrhea    from Abx 2010 hospitalized   . Cataract    s/p b/l repair   . Chicken pox   . CKD (chronic kidney disease)   . CKD (chronic kidney disease), stage III ([place])    a. s/p R nephrectomy./ aneurysm  . Conversion disorder   . COPD (chronic  obstructive pulmonary disease) ([place])   . Depression   . Essential hypertension   . GERD (gastroesophageal reflux disease)   . Hyperlipidemia   . ILD (interstitial lung disease) ([place])    mild; 2/2 RA diagnosis  . Inflammatory arthritis    a. hands/carpal tunnel.  b. Low titer rheumatoid factor. c. Negative anti-CCP antibodies. d. Plaquenil.  . Nocturnal hypoxemia   . Non-Obstructive CAD    a. 07/2009 Cath ([facility]): nonobs dzs;  b. 03/2011 Cath [facility]): nonobs dzs.  . Osteoarthritis    a. Knees.  Marland Kitchen PAD (peripheral artery disease) ([place])   . PUD (peptic ulcer disease)   . S/P right hip fracture    11/01/16 s/p repair  . Shoulder pain   . Sleep apnea    no cpap / minimal  . Spinal stenosis at L4-L5 level    severe with L4/L5 anterolisthesis grade 1 anterolisthesis   . Toxic maculopathy   . Valvular heart disease    a. 07/2015 Echo: EF 55-60%, Mild AI, AS, MR, and TR.    Assessment/Plan: 2 Days Post-Op Procedure(s) (LRB): OPEN REDUCTION INTERNAL FIXATION (ORIF) OF LEFT SCAPULAR NONUNION WITH BONE GRAFT (Left) Active Problems:   Acromial process of scapula fracture  Estimated body mass index is 30.86 kg/m as calculated from the following:   Height as of this encounter: 5' 1.5" (1.562 m).   Weight as  of this encounter: 75.3 kg. Advance diet Up with therapy Labs and vital signs are stable Pain well controlled.  No pain unless she moves her arm. Care management to assist with discharge to skilled nursing facility.  She has no family support at home.  Patient requesting [employer].   DVT Prophylaxis - Lovenox and TED hose Non-weightbearing to the left arm.  Ronney Asters, PA-C [facility] Orthopaedic Surgery 12/23/2019, 7:33 AM

## 2019-12-24 LAB — BASIC METABOLIC PANEL
Anion gap: 11 (ref 5–15)
BUN: 25 mg/dL — ABNORMAL HIGH (ref 8–23)
CO2: 31 mmol/L (ref 22–32)
Calcium: 8.9 mg/dL (ref 8.9–10.3)
Chloride: 99 mmol/L (ref 98–111)
Creatinine, Ser: 1.28 mg/dL — ABNORMAL HIGH (ref 0.44–1.00)
GFR, Estimated: 43 mL/min — ABNORMAL LOW (ref >60)
Glucose, Bld: 108 mg/dL — ABNORMAL HIGH (ref 70–99)
Potassium: 4.5 mmol/L (ref 3.5–5.1)
Sodium: 141 mmol/L (ref 135–145)

## 2019-12-24 NOTE — Progress Notes (Addendum)
Subjective: 3 Days Post-Op Procedure(s) (LRB): OPEN REDUCTION INTERNAL FIXATION (ORIF) OF LEFT SCAPULAR NONUNION WITH BONE GRAFT (Left) Patient reports pain as 2 on 0-10 scale.   Patient is well, and has had no acute complaints or problems Plan is to go Rehab after hospital stay. Negative for chest pain and shortness of breath Fever: no Gastrointestinal:Negative for nausea and vomiting  Objective: Vital signs in last 24 hours: Temp:  [97.9 F (36.6 C)-98.3 F (36.8 C)] 98 F (36.7 C) (11/21 0827) Pulse Rate:  [58-89] 68 (11/21 0827) Resp:  [16-17] 16 (11/21 0827) BP: (123-165)/(50-59) 143/58 (11/21 0827) SpO2:  [87 %-100 %] 95 % (11/21 0827)  Intake/Output from previous day:  Intake/Output Summary (Last 24 hours) at 12/24/2019 0901 Last data filed at 12/23/2019 2200 Gross per 24 hour  Intake 240 ml  Output --  Net 240 ml    Intake/Output this shift: No intake/output data recorded.  Labs: Recent Labs    12/22/19 0513 12/23/19 0631  HGB 11.6* 11.7*   Recent Labs    12/22/19 0513 12/23/19 0631  WBC 13.5* 13.0*  RBC 3.99 4.00  HCT 36.3 36.4  PLT 200 210   Recent Labs    12/23/19 0631 12/24/19 0454  NA 141 141  K 4.8 4.5  CL 102 99  CO2 31 31  BUN 23 25*  CREATININE 1.29* 1.28*  GLUCOSE 114* 108*  CALCIUM 8.8* 8.9   No results for input(s): LABPT, INR in the last 72 hours.   EXAM General - Patient is Alert, Appropriate and Oriented Extremity - ABD soft Neurovascular intact Intact pulses distally Dorsiflexion/Plantar flexion intact Incision: scant drainage No cellulitis present Dressing/Incision - blood tinged drainage Motor Function - intact, moving foot and toes well on exam.   Past Medical History:  Diagnosis Date  . Anxiety   . Asthma   . Bipolar disorder ([place])   . C. difficile diarrhea    from Abx 2010 hospitalized   . Cataract    s/p b/l repair   . Chicken pox   . CKD (chronic kidney disease)   . CKD (chronic kidney disease),  stage III ([place])    a. s/p R nephrectomy./ aneurysm  . Conversion disorder   . COPD (chronic obstructive pulmonary disease) ([place])   . Depression   . Essential hypertension   . GERD (gastroesophageal reflux disease)   . Hyperlipidemia   . ILD (interstitial lung disease) ([place])    mild; 2/2 RA diagnosis  . Inflammatory arthritis    a. hands/carpal tunnel.  b. Low titer rheumatoid factor. c. Negative anti-CCP antibodies. d. Plaquenil.  . Nocturnal hypoxemia   . Non-Obstructive CAD    a. 07/2009 Cath ([facility]): nonobs dzs;  b. 03/2011 Cath [facility]): nonobs dzs.  . Osteoarthritis    a. Knees.  Marland Kitchen PAD (peripheral artery disease) ([place])   . PUD (peptic ulcer disease)   . S/P right hip fracture    11/01/16 s/p repair  . Shoulder pain   . Sleep apnea    no cpap / minimal  . Spinal stenosis at L4-L5 level    severe with L4/L5 anterolisthesis grade 1 anterolisthesis   . Toxic maculopathy   . Valvular heart disease    a. 07/2015 Echo: EF 55-60%, Mild AI, AS, MR, and TR.    Assessment/Plan: 3 Days Post-Op Procedure(s) (LRB): OPEN REDUCTION INTERNAL FIXATION (ORIF) OF LEFT SCAPULAR NONUNION WITH BONE GRAFT (Left) Active Problems:   Acromial process of scapula fracture  Estimated body mass index  is 30.86 kg/m as calculated from the following:   Height as of this encounter: 5' 1.5" (1.562 m).   Weight as of this encounter: 75.3 kg. Advance diet Up with therapy Labs and vital signs are stable Pain well controlled.   Care management to assist with discharge to skilled nursing facility.  She has no family support at home.  Patient requesting [employer].   DVT Prophylaxis - Lovenox and TED hose Non-weightbearing to the left arm.  Ronney Asters, PA-C [facility] Orthopaedic Surgery 12/24/2019, 9:01 AM

## 2019-12-24 NOTE — Progress Notes (Signed)
Physical Therapy Treatment Patient Details Name: Kerri Carter MRN: [id number] DOB: 06-25-1940 Today's Date: 12/24/2019    History of Present Illness Kerri Carter is a 89yoF who comes to [facility] 11/18 for surgical fixation of Left acromion stress fracture s/p fall 16 weeks ago. PMH includes TSA bilat.    PT Comments    Pt is pleasant & agreeable to tx. Pt adjusts height of QC for increased pt comfort with mobility. Pt requires supervision for bed mobility with hospital bed features, supervision sit<>stand with QC, and close supervision but increasing to CGA for gait with QC. Pt does require cuing for proper gait pattern with AD on this date, as well as cuing for increased step length. Gait distances are limited by pt fatigue & c/o SOB although SPO2 >90% throughout session; PT educates pt on pursed lip breathing to help alleviate feeling SOB. Pt then performs the following exercises for strengthening: LAQ, seated marches, SLR, hip adduction squeezes, and ankle pumps. Pt utilized incentive spirometer with PT providing cuing for proper use of device. Will continue to follow pt acutely to focus on endurance, strengthening, balance, & gait training. D/C recommendations of SNF remain appropriate at this time.   Follow Up Recommendations  SNF;Supervision for mobility/OOB;Supervision/Assistance - 24 hour     Equipment Recommendations  None recommended by PT    Recommendations for Other Services       Precautions / Restrictions Precautions Precautions: Fall Required Braces or Orthoses: Sling Restrictions Weight Bearing Restrictions: Yes LUE Weight Bearing: Non weight bearing    Mobility  Bed Mobility Overal bed mobility: Needs Assistance Bed Mobility: Supine to Sit     Supine to sit: Supervision;HOB elevated     General bed mobility comments: utilizes bed rails  Transfers Overall transfer level: Needs assistance Equipment used: Quad cane Transfers: Sit to/from Stand Sit to Stand:  Supervision            Ambulation/Gait Ambulation/Gait assistance: Supervision;Min guard Gait Distance (Feet): 60 Feet Assistive device: Quad cane Gait Pattern/deviations: Decreased step length - right;Decreased step length - left;Decreased stride length;Decreased weight shift to left;Decreased dorsiflexion - right;Decreased dorsiflexion - left Gait velocity: decreased   General Gait Details: cuing for proper gait pattern with QC, cuing but poor return demo of increased step length   Stairs             Wheelchair Mobility    Modified Rankin (Stroke Patients Only)       Balance Overall balance assessment: History of Falls;Mild deficits observed, not formally tested         Standing balance support: Single extremity supported;During functional activity Standing balance-Leahy Scale: Poor Standing balance comment: 1UE support on quad cane, supervision<>CGA gait with AD                            Cognition Arousal/Alertness: Awake/alert Behavior During Therapy: WFL for tasks assessed/performed Overall Cognitive Status: Within Functional Limits for tasks assessed                                        Exercises      General Comments        Pertinent Vitals/Pain Pain Assessment:  (pt only reports unrated sore throat 2/2 intubation tube from surgery)    Home Living  Prior Function            PT Goals (current goals can now be found in the care plan section) Acute Rehab PT Goals Patient Stated Goal: to return to PLOF  PT Goal Formulation: With patient Time For Goal Achievement: 01/05/20 Potential to Achieve Goals: Fair Progress towards PT goals: Progressing toward goals    Frequency    7X/week      PT Plan Current plan remains appropriate    Co-evaluation              AM-PAC PT "6 Clicks" Mobility   Outcome Measure  Help needed turning from your back to your side while in a  flat bed without using bedrails?: A Little Help needed moving from lying on your back to sitting on the side of a flat bed without using bedrails?: A Little Help needed moving to and from a bed to a chair (including a wheelchair)?: A Little Help needed standing up from a chair using your arms (e.g., wheelchair or bedside chair)?: A Little Help needed to walk in hospital room?: A Little Help needed climbing 3-5 steps with a railing? : A Little 6 Click Score: 18    End of Session Equipment Utilized During Treatment: Gait belt Activity Tolerance: Patient tolerated treatment well;Patient limited by fatigue Patient left: in chair;with chair alarm set;with call bell/phone within reach;with SCD's reapplied (polar care on LUE) Nurse Communication: Mobility status PT Visit Diagnosis: Difficulty in walking, not elsewhere classified (R26.2);Unsteadiness on feet (R26.81);Other abnormalities of gait and mobility (R26.89);History of falling (Z91.81)     Time: 3976-7341 PT Time Calculation (min) (ACUTE ONLY): 24 min  Charges:  $Therapeutic Exercise: 8-22 mins $Therapeutic Activity: 8-22 mins                     Lavone Nian, PT, DPT 12/24/19, 10:58 AM    [name] 12/24/2019, 10:55 AM

## 2019-12-25 LAB — RESP PANEL BY RT-PCR (FLU A&B, COVID) ARPGX2
Influenza A by PCR: NEGATIVE
Influenza B by PCR: NEGATIVE
SARS Coronavirus 2 by RT PCR: NEGATIVE

## 2019-12-25 NOTE — Care Management Important Message (Signed)
Important Message  Patient Details  Name: LAYSA KIMMEY MRN: [id number] Date of Birth: [birth year]   Medicare Important Message Given:  Yes     Dannette Barbara 12/25/2019, 11:20 AM

## 2019-12-25 NOTE — Progress Notes (Signed)
Subjective: 4 Days Post-Op Procedure(s) (LRB): OPEN REDUCTION INTERNAL FIXATION (ORIF) OF LEFT SCAPULAR NONUNION WITH BONE GRAFT (Left) Patient reports pain as mild.   Patient is well, and has had no acute complaints or problems Plan is to go Rehab after hospital stay. Negative for chest pain and shortness of breath Fever: no Gastrointestinal:Negative for nausea and vomiting  Objective: Vital signs in last 24 hours: Temp:  [97.6 F (36.4 C)-98.2 F (36.8 C)] 97.9 F (36.6 C) (11/22 0734) Pulse Rate:  [57-79] 61 (11/22 0734) Resp:  [16-17] 17 (11/22 0734) BP: (120-182)/(50-85) 151/85 (11/22 0734) SpO2:  [92 %-98 %] 98 % (11/22 0734)  Intake/Output from previous day:  Intake/Output Summary (Last 24 hours) at 12/25/2019 0750 Last data filed at 12/24/2019 2200 Gross per 24 hour  Intake 240 ml  Output --  Net 240 ml    Intake/Output this shift: No intake/output data recorded.  Labs: Recent Labs    12/23/19 0631  HGB 11.7*   Recent Labs    12/23/19 0631  WBC 13.0*  RBC 4.00  HCT 36.4  PLT 210   Recent Labs    12/23/19 0631 12/24/19 0454  NA 141 141  K 4.8 4.5  CL 102 99  CO2 31 31  BUN 23 25*  CREATININE 1.29* 1.28*  GLUCOSE 114* 108*  CALCIUM 8.8* 8.9   No results for input(s): LABPT, INR in the last 72 hours.   EXAM General - Patient is Alert, Appropriate and Oriented Extremity - ABD soft Neurovascular intact Intact pulses distally Dorsiflexion/Plantar flexion intact Incision: moderate drainage No cellulitis present  New honeycomb dressing applied to the left shoulder.  No signs of infection to the left shoulder incision. Dressing/Incision - blood tinged drainage Motor Function - intact, moving foot and toes well on exam.   Past Medical History:  Diagnosis Date  . Anxiety   . Asthma   . Bipolar disorder ([place])   . C. difficile diarrhea    from Abx 2010 hospitalized   . Cataract    s/p b/l repair   . Chicken pox   . CKD (chronic kidney  disease)   . CKD (chronic kidney disease), stage III ([place])    a. s/p R nephrectomy./ aneurysm  . Conversion disorder   . COPD (chronic obstructive pulmonary disease) ([place])   . Depression   . Essential hypertension   . GERD (gastroesophageal reflux disease)   . Hyperlipidemia   . ILD (interstitial lung disease) ([place])    mild; 2/2 RA diagnosis  . Inflammatory arthritis    a. hands/carpal tunnel.  b. Low titer rheumatoid factor. c. Negative anti-CCP antibodies. d. Plaquenil.  . Nocturnal hypoxemia   . Non-Obstructive CAD    a. 07/2009 Cath ([facility]): nonobs dzs;  b. 03/2011 Cath [facility]): nonobs dzs.  . Osteoarthritis    a. Knees.  Marland Kitchen PAD (peripheral artery disease) ([place])   . PUD (peptic ulcer disease)   . S/P right hip fracture    11/01/16 s/p repair  . Shoulder pain   . Sleep apnea    no cpap / minimal  . Spinal stenosis at L4-L5 level    severe with L4/L5 anterolisthesis grade 1 anterolisthesis   . Toxic maculopathy   . Valvular heart disease    a. 07/2015 Echo: EF 55-60%, Mild AI, AS, MR, and TR.    Assessment/Plan: 4 Days Post-Op Procedure(s) (LRB): OPEN REDUCTION INTERNAL FIXATION (ORIF) OF LEFT SCAPULAR NONUNION WITH BONE GRAFT (Left) Active Problems:   Acromial process of  scapula fracture  Estimated body mass index is 30.86 kg/m as calculated from the following:   Height as of this encounter: 5' 1.5" (1.562 m).   Weight as of this encounter: 75.3 kg. Advance diet Up with therapy   Labs and vital signs are stable Pain well controlled.   Care management to assist with discharge to skilled nursing facility.  She has no family support at home.  Patient requesting [employer].  Patient has had a BM. Plan for discharge to SNF today.  Rapid COVID test ordered for this AM.  DVT Prophylaxis - Lovenox and TED hose Non-weightbearing to the left arm.  Raquel Alezandra Egli, PA-C [facility] Orthopaedic Surgery 12/25/2019, 7:50 AM

## 2019-12-25 NOTE — TOC Progression Note (Signed)
Transition of Care [facility]) - Progression Note    Patient Details  Name: Kerri Carter MRN: [id number] Date of Birth: 1940/10/19  Transition of Care [facility]) CM/SW [place], RN Phone Number: 12/25/2019, 11:03 AM  Clinical Narrative:   Patient accepted bed at [facility], facility will be able to accept patient on Tuesday 11/22. RNCM started insurance authorization through [employer].     Expected Discharge Plan: Skilled Nursing Facility Barriers to Discharge: SNF Pending bed offer  Expected Discharge Plan and Services Expected Discharge Plan: [place] Choice: [place] arrangements for the past 2 months: Single Family Home                                       Social Determinants of Health (SDOH) Interventions    Readmission Risk Interventions No flowsheet data found.

## 2019-12-25 NOTE — TOC Progression Note (Signed)
Transition of Care [facility]) - Progression Note    Patient Details  Name: Kerri Carter MRN: [id number] Date of Birth: Sep 02, 1940  Transition of Care [facility]) CM/SW [place], RN Phone Number: 12/25/2019, 2:35 PM  Clinical Narrative:   RNCM received phone call from [facility] with [place] is leaning towards denying SNF, they are offering a peer to peer. Phone number is [phone] option 5. The deadline for the peer to peer is tomorrow 11/23 at 9:30am.     Expected Discharge Plan: [place] Barriers to Discharge: SNF Pending bed offer  Expected Discharge Plan and Services Expected Discharge Plan: [place] Choice: [place] arrangements for the past 2 months: Single Family Home Expected Discharge Date: 12/25/19                                     Social Determinants of Health (SDOH) Interventions    Readmission Risk Interventions No flowsheet data found.

## 2019-12-25 NOTE — Progress Notes (Addendum)
Physical Therapy Treatment Patient Details Name: Kerri Carter MRN: [id number] DOB: 12/15/1940 Today's Date: 12/25/2019    History of Present Illness Kerri Carter is a 51yoF who comes to [facility] 11/18 for surgical fixation of Left acromion stress fracture s/p fall 16 weeks ago. PMH includes TSA bilat.    PT Comments    Pt received standing in room with NT assisting her with grooming tasks & pt agreeable to tx. Pt is able to ambulate increased distances today with supervision overall except CGA for turning with use of large based quad cane but then requires seated rest break 2/2 fatigue. Pt then ambulates in room to/from bathroom with QC & performs toilet transfer with supervision. Pt with continent void & BM on toilet & performs peri hygiene without assistance but requires assist to pull brief over hips. Pt performs hand hygiene standing at sink with supervision. At end of session pt notes fatigue after increased ambulation on this date. Pt eager to d/c to [facility]. Will continue to follow pt acutely to address endurance, balance & gait deficits as noted.    Follow Up Recommendations  SNF;Supervision for mobility/OOB;Supervision/Assistance - 24 hour     Equipment Recommendations  None recommended by PT    Recommendations for Other Services       Precautions / Restrictions Precautions Precautions: Fall Required Braces or Orthoses: Sling Restrictions Weight Bearing Restrictions: Yes LUE Weight Bearing: Non weight bearing    Mobility  Bed Mobility                  Transfers Overall transfer level: Needs assistance Equipment used: Quad cane Transfers: Sit to/from Stand Sit to Stand: Supervision         General transfer comment: cuing to push from stable surface vs pushing down on QC for sit>stand  Ambulation/Gait Ambulation/Gait assistance: Supervision;Min guard (supervision for linear path, CGA for turning) Gait Distance (Feet): 130 Feet (+ 25 ft + 25  ft) Assistive device: Quad cane Gait Pattern/deviations: Decreased step length - right;Decreased step length - left;Decreased stride length;Decreased weight shift to left;Decreased dorsiflexion - right;Decreased dorsiflexion - left Gait velocity: decreased   General Gait Details: occasional cuing for increased step length LLE   Stairs             Wheelchair Mobility    Modified Rankin (Stroke Patients Only)       Balance Overall balance assessment: History of Falls;Mild deficits observed, not formally tested   Sitting balance-Leahy Scale: Good     Standing balance support: No upper extremity supported;During functional activity Standing balance-Leahy Scale: Fair Standing balance comment: pt able to perform static standing without UE support with supervision while washing hands                            Cognition Arousal/Alertness: Awake/alert Behavior During Therapy: WFL for tasks assessed/performed Overall Cognitive Status: Within Functional Limits for tasks assessed                                        Exercises      General Comments General comments (skin integrity, edema, etc.): SpO2 >90% on room air throughout session      Pertinent Vitals/Pain Pain Assessment: No/denies pain    Home Living  Prior Function            PT Goals (current goals can now be found in the care plan section) Acute Rehab PT Goals Patient Stated Goal: to return to PLOF  PT Goal Formulation: With patient Time For Goal Achievement: 01/05/20 Potential to Achieve Goals: Fair Progress towards PT goals: Progressing toward goals    Frequency    7X/week      PT Plan Current plan remains appropriate    Co-evaluation              AM-PAC PT "6 Clicks" Mobility   Outcome Measure  Help needed turning from your back to your side while in a flat bed without using bedrails?: A Little Help needed moving from  lying on your back to sitting on the side of a flat bed without using bedrails?: A Little Help needed moving to and from a bed to a chair (including a wheelchair)?: A Little Help needed standing up from a chair using your arms (e.g., wheelchair or bedside chair)?: A Little Help needed to walk in hospital room?: A Little Help needed climbing 3-5 steps with a railing? : A Little 6 Click Score: 18    End of Session Equipment Utilized During Treatment: Gait belt Activity Tolerance: Patient tolerated treatment well Patient left: in chair;with chair alarm set;with call bell/phone within reach;with SCD's reapplied; polar care on LUE   PT Visit Diagnosis: Difficulty in walking, not elsewhere classified (R26.2);Unsteadiness on feet (R26.81);Other abnormalities of gait and mobility (R26.89);History of falling (Z91.81)     Time: 6701-4103 PT Time Calculation (min) (ACUTE ONLY): 30 min  Charges:  $Therapeutic Activity: 23-37 mins                     Lavone Nian, PT, DPT 12/25/19, 9:39 AM    [name] 12/25/2019, 9:37 AM

## 2019-12-26 NOTE — Progress Notes (Signed)
Subjective: 5 Days Post-Op Procedure(s) (LRB): OPEN REDUCTION INTERNAL FIXATION (ORIF) OF LEFT SCAPULAR NONUNION WITH BONE GRAFT (Left) Patient reports pain as mild.   Patient is well, and has had no acute complaints or problems Insurance denied SNF discharge, will be going home today with HHPT. Negative for chest pain and shortness of breath Fever: no Gastrointestinal:Negative for nausea and vomiting  Objective: Vital signs in last 24 hours: Temp:  [97.9 F (36.6 C)-98.5 F (36.9 C)] 98.1 F (36.7 C) (11/23 0832) Pulse Rate:  [65-74] 68 (11/23 0832) Resp:  [15-16] 16 (11/23 0832) BP: (102-179)/(44-64) 154/52 (11/23 0832) SpO2:  [94 %-97 %] 96 % (11/23 0832)  Intake/Output from previous day:  Intake/Output Summary (Last 24 hours) at 12/26/2019 1032 Last data filed at 12/26/2019 1010 Gross per 24 hour  Intake 960 ml  Output --  Net 960 ml    Intake/Output this shift: Total I/O In: 600 [P.O.:600] Out: -   Labs: No results for input(s): HGB in the last 72 hours. No results for input(s): WBC, RBC, HCT, PLT in the last 72 hours. Recent Labs    12/24/19 0454  NA 141  K 4.5  CL 99  CO2 31  BUN 25*  CREATININE 1.28*  GLUCOSE 108*  CALCIUM 8.9   No results for input(s): LABPT, INR in the last 72 hours.   EXAM General - Patient is Alert, Appropriate and Oriented Extremity - ABD soft Neurovascular intact Intact pulses distally Dorsiflexion/Plantar flexion intact Incision: moderate drainage No cellulitis present  No drainage noted to the left shoulder. Dressing/Incision - blood tinged drainage Motor Function - intact, moving foot and toes well on exam.   Past Medical History:  Diagnosis Date  . Anxiety   . Asthma   . Bipolar disorder ([place])   . C. difficile diarrhea    from Abx 2010 hospitalized   . Cataract    s/p b/l repair   . Chicken pox   . CKD (chronic kidney disease)   . CKD (chronic kidney disease), stage III ([place])    a. s/p R nephrectomy./  aneurysm  . Conversion disorder   . COPD (chronic obstructive pulmonary disease) ([place])   . Depression   . Essential hypertension   . GERD (gastroesophageal reflux disease)   . Hyperlipidemia   . ILD (interstitial lung disease) ([place])    mild; 2/2 RA diagnosis  . Inflammatory arthritis    a. hands/carpal tunnel.  b. Low titer rheumatoid factor. c. Negative anti-CCP antibodies. d. Plaquenil.  . Nocturnal hypoxemia   . Non-Obstructive CAD    a. 07/2009 Cath ([facility]): nonobs dzs;  b. 03/2011 Cath [facility]): nonobs dzs.  . Osteoarthritis    a. Knees.  Marland Kitchen PAD (peripheral artery disease) ([place])   . PUD (peptic ulcer disease)   . S/P right hip fracture    11/01/16 s/p repair  . Shoulder pain   . Sleep apnea    no cpap / minimal  . Spinal stenosis at L4-L5 level    severe with L4/L5 anterolisthesis grade 1 anterolisthesis   . Toxic maculopathy   . Valvular heart disease    a. 07/2015 Echo: EF 55-60%, Mild AI, AS, MR, and TR.    Assessment/Plan: 5 Days Post-Op Procedure(s) (LRB): OPEN REDUCTION INTERNAL FIXATION (ORIF) OF LEFT SCAPULAR NONUNION WITH BONE GRAFT (Left) Active Problems:   Acromial process of scapula fracture  Estimated body mass index is 30.86 kg/m as calculated from the following:   Height as of this encounter: 5' 1.5" (  1.562 m).   Weight as of this encounter: 75.3 kg. Advance diet Up with therapy   Labs and vital signs are stable Pain well controlled.   Care management to assist with discharge home today with HHPT. Patient has had a BM. Plan for discharge home today.  DVT Prophylaxis - Lovenox and TED hose Non-weightbearing to the left arm.  Raquel Shaima Sardinas, PA-C [facility] Orthopaedic Surgery 12/26/2019, 10:32 AM

## 2019-12-26 NOTE — Progress Notes (Signed)
Occupational Therapy Treatment Patient Details Name: Kerri Carter MRN: [id number] DOB: 04/02/1940 Today's Date: 12/26/2019    History of present illness Kerri Carter is a 50yoF who comes to [facility] 11/18 for surgical fixation of Left acromion stress fracture s/p fall 16 weeks ago. PMH includes TSA bilat.   OT comments  Kerri Carter was seen for OT treatment on this date. Upon arrival to room pt seated in chair c RN requesting assist c dressing for d/c. Pt instructed in DME recs, d/c recs, falls prevention, ECS, polar care mgmt, functional application of NWBing pcns, adapted dressing techniques, and sling immobilizer mgmt. MOD A don/doff sling/UBD and MAX A don/doff polar care seated EOC. SBA + QC toilet t/f including LBD mgmt standing at elevated toilet. Pt making good progress toward goals. Pt continues to benefit from skilled OT services to maximize return to PLOF and minimize risk of future falls, injury, caregiver burden, and readmission. Will continue to follow POC. Discharge recommendation remains appropriate.    Follow Up Recommendations  SNF    Equipment Recommendations  None recommended by OT    Recommendations for Other Services      Precautions / Restrictions Precautions Precautions: Fall Required Braces or Orthoses: Sling Restrictions Weight Bearing Restrictions: Yes LUE Weight Bearing: Non weight bearing       Mobility Bed Mobility Overal bed mobility: Needs Assistance     General bed mobility comments: received and left up in chair  Transfers Overall transfer level: Needs assistance Equipment used: Quad cane Transfers: Sit to/from Stand Sit to Stand: Supervision     Balance Overall balance assessment: History of Falls;Needs assistance Sitting-balance support: Feet supported Sitting balance-Leahy Scale: Good Sitting balance - Comments: no LOB in sitting   Standing balance support: No upper extremity supported;During functional activity Standing balance-Leahy  Scale: Fair Standing balance comment: SBA clothing mgmt in standing - no LOBs but wide BOS        ADL either performed or assessed with clinical judgement   ADL Overall ADL's : Needs assistance/impaired        General ADL Comments: MOD A don/doff sling/UBD. MAX A don/doff polar care seated EOB. SBA + QC toilet t/f including LBD mgmt standing at elevated toilet.                Cognition Arousal/Alertness: Awake/alert Behavior During Therapy: WFL for tasks assessed/performed Overall Cognitive Status: Within Functional Limits for tasks assessed      General Comments: Pt was A and O x 4 and cooperative and motivated throughout        Exercises Exercises: Other exercises Other Exercises Other Exercises: Pt educated re: OT role, DME recs, d/c recs, falls prevention, ECS, polar care mgmt, functional application of NWBing pcns, adapted dressing techniques Other Exercises: LBD, toileting, don/doff gown/sling/polar care, sit<>stand, sitting/standing balance/tolerance           Pertinent Vitals/ Pain       Pain Assessment: No/denies pain         Frequency  Min 1X/week        Progress Toward Goals  OT Goals(current goals can now be found in the care plan section)  Progress towards OT goals: Progressing toward goals  Acute Rehab OT Goals Patient Stated Goal: to return to PLOF  OT Goal Formulation: With patient Time For Goal Achievement: 01/05/20 Potential to Achieve Goals: Good ADL Goals Pt Will Perform Grooming: with set-up;standing;with supervision Pt Will Perform Upper Body Dressing: with min assist;sitting Pt Will Transfer to Toilet:  with min guard assist;ambulating;bedside commode (c LRAD PRN)  Plan Discharge plan remains appropriate;Frequency remains appropriate       AM-PAC OT "6 Clicks" Daily Activity     Outcome Measure   Help from another person eating meals?: A Little Help from another person taking care of personal grooming?: A Little Help from  another person toileting, which includes using toliet, bedpan, or urinal?: A Little Help from another person bathing (including washing, rinsing, drying)?: A Lot Help from another person to put on and taking off regular upper body clothing?: A Lot Help from another person to put on and taking off regular lower body clothing?: A Lot 6 Click Score: 15    End of Session Equipment Utilized During Treatment: Other (comment) (QC)  OT Visit Diagnosis: Other abnormalities of gait and mobility (R26.89)   Activity Tolerance Patient tolerated treatment well   Patient Left in chair;with call bell/phone within reach;with nursing/sitter in room   Nurse Communication Mobility status        Time: 1364-3837 OT Time Calculation (min): 26 min  Charges: OT General Charges $OT Visit: 1 Visit OT Treatments $Self Care/Home Management : 23-37 mins  Dessie Coma, M.S. OTR/L  12/26/19, 12:44 PM  ascom [phone]

## 2019-12-26 NOTE — Progress Notes (Signed)
Patient is stable and ready for discharge home with H/H. Patient's IV removed. Writer went over discharge paperwork and provided hard script prescriptions placed in discharge packet paperwork, she verbalized understanding and had no questions. OT in with patient and helped her get dressed and answered all other concerns in regards to the shoulder immobilizer and she was okay with demonstration and information provided by OT. Patient's granddaughter will be picking patient up.

## 2019-12-26 NOTE — TOC Progression Note (Signed)
Transition of Care [facility]) - Progression Note    Patient Details  Name: LEYTON BROWNLEE MRN: [id number] Date of Birth: March 12, 1940  [facility] CM/SW [place], RN Phone Number: 12/26/2019, 9:08 AM  Clinical Narrative:   RNCM met with patient at bedside. Patient was able to get through to [facility] yesterday afternoon and was informed that they have denied her. Patient is agreeable to going home and wants to keep the home health she already has in place through [facility]. She reports that her granddaughter will pick her up today.  RNCM reached out to Malin with [facility] she already has PT and OT and they will had home health aide.     Expected Discharge Plan: [place] Barriers to Discharge: SNF Pending bed offer  Expected Discharge Plan and Services Expected Discharge Plan: [place] Choice: [place] arrangements for the past 2 months: Single Family Home Expected Discharge Date: 12/25/19                                     Social Determinants of Health (SDOH) Interventions    Readmission Risk Interventions No flowsheet data found.

## 2019-12-26 NOTE — Progress Notes (Signed)
Physical Therapy Treatment Patient Details Name: Kerri Carter MRN: [id number] DOB: [birth year] Today's Date: 12/26/2019    History of Present Illness Kerri Carter is a 86yoF who comes to [facility] 11/18 for surgical fixation of Left acromion stress fracture s/p fall 16 weeks ago. PMH includes TSA bilat.    PT Comments    Pt was long sitting in bed upon arriving. She is A and O x 4 and agreeable and pleasant throughout. Pt is aware of insurance denial and planning to DC home later this date. Answered  all questions as able and discussed with OT pt's inability to DC to SNF. OT attempting to see pt prior to DC. She was able to exit L side of bed without physical assistance however HOB was elevated and pt used bed rail for assistance. Discussed amount of assistance pt will have at home. She reports she has a grandson that will be around when not at work and have his wife home throughout the day. Pt is agreeable to [facility] services. Pt was able to stand and ambulate with quad cane with some slight unsteadiness however no intervention required. Recommend close supervision with mobility at home. Will continue to benefit from skilled PT at DC to address deficits and impropve safe functional mobility.    Follow Up Recommendations  SNF;Supervision for mobility/OOB;Supervision/Assistance - 24 hour     Equipment Recommendations  None recommended by PT    Recommendations for Other Services       Precautions / Restrictions Precautions Precautions: Fall Required Braces or Orthoses: Sling Restrictions Weight Bearing Restrictions: Yes LUE Weight Bearing: Non weight bearing    Mobility  Bed Mobility Overal bed mobility: Needs Assistance Bed Mobility: Supine to Sit     Supine to sit: Supervision;HOB elevated     General bed mobility comments: utilizes bed rails  Transfers Overall transfer level: Needs assistance Equipment used: Quad cane Transfers: Sit to/from Stand Sit to Stand: Supervision             Ambulation/Gait Ambulation/Gait assistance: Supervision;Min guard Gait Distance (Feet): 100 Feet Assistive device: Quad cane   Gait velocity: decreased   General Gait Details: pt was able to ambulate 100 ft with QC without LOB. CGA during turning for safety      Balance Overall balance assessment: History of Falls;Needs assistance Sitting-balance support: Feet supported Sitting balance-Leahy Scale: Good Sitting balance - Comments: no LOB in sitting   Standing balance support: Single extremity supported;During functional activity Standing balance-Leahy Scale: Fair Standing balance comment: does have slight unsteadiness with gait training with use of QC for support         Cognition Arousal/Alertness: Awake/alert Behavior During Therapy: WFL for tasks assessed/performed Overall Cognitive Status: Within Functional Limits for tasks assessed      General Comments: Pt was A and O x 4 and cooperative and motivated throughout             Pertinent Vitals/Pain Pain Assessment: No/denies pain           PT Goals (current goals can now be found in the care plan section) Acute Rehab PT Goals Patient Stated Goal: to return to PLOF  Progress towards PT goals: Progressing toward goals    Frequency    7X/week      PT Plan Current plan remains appropriate       AM-PAC PT "6 Clicks" Mobility   Outcome Measure  Help needed turning from your back to your side while in a flat bed without  using bedrails?: A Little Help needed moving from lying on your back to sitting on the side of a flat bed without using bedrails?: A Little Help needed moving to and from a bed to a chair (including a wheelchair)?: A Little Help needed standing up from a chair using your arms (e.g., wheelchair or bedside chair)?: A Little Help needed to walk in hospital room?: A Little Help needed climbing 3-5 steps with a railing? : A Little 6 Click Score: 18    End of Session Equipment  Utilized During Treatment: Gait belt Activity Tolerance: Patient tolerated treatment well Patient left: in chair;with chair alarm set;with call bell/phone within reach;with SCD's reapplied Nurse Communication: Mobility status PT Visit Diagnosis: Difficulty in walking, not elsewhere classified (R26.2);Unsteadiness on feet (R26.81);Other abnormalities of gait and mobility (R26.89);History of falling (Z91.81)     Time: 5909-3112 PT Time Calculation (min) (ACUTE ONLY): 27 min  Charges:  $Gait Training: 8-22 mins $Therapeutic Activity: 8-22 mins                     Julaine Fusi PTA 12/26/19, 10:51 AM

## 2020-01-01 ENCOUNTER — Telehealth: Payer: Medicare PPO | Admitting: Psychiatry

## 2020-01-01 ENCOUNTER — Other Ambulatory Visit: Payer: Self-pay

## 2020-01-01 DIAGNOSIS — S42122D Displaced fracture of acromial process, left shoulder, subsequent encounter for fracture with routine healing: Secondary | ICD-10-CM | POA: Diagnosis not present

## 2020-01-01 DIAGNOSIS — E1151 Type 2 diabetes mellitus with diabetic peripheral angiopathy without gangrene: Secondary | ICD-10-CM | POA: Diagnosis not present

## 2020-01-01 DIAGNOSIS — I872 Venous insufficiency (chronic) (peripheral): Secondary | ICD-10-CM | POA: Diagnosis not present

## 2020-01-01 DIAGNOSIS — D631 Anemia in chronic kidney disease: Secondary | ICD-10-CM | POA: Diagnosis not present

## 2020-01-01 DIAGNOSIS — I129 Hypertensive chronic kidney disease with stage 1 through stage 4 chronic kidney disease, or unspecified chronic kidney disease: Secondary | ICD-10-CM | POA: Diagnosis not present

## 2020-01-01 DIAGNOSIS — N1832 Chronic kidney disease, stage 3b: Secondary | ICD-10-CM | POA: Diagnosis not present

## 2020-01-01 DIAGNOSIS — E1122 Type 2 diabetes mellitus with diabetic chronic kidney disease: Secondary | ICD-10-CM | POA: Diagnosis not present

## 2020-01-01 DIAGNOSIS — J449 Chronic obstructive pulmonary disease, unspecified: Secondary | ICD-10-CM | POA: Diagnosis not present

## 2020-01-01 DIAGNOSIS — S42122K Displaced fracture of acromial process, left shoulder, subsequent encounter for fracture with nonunion: Secondary | ICD-10-CM | POA: Diagnosis not present

## 2020-01-01 DIAGNOSIS — E1142 Type 2 diabetes mellitus with diabetic polyneuropathy: Secondary | ICD-10-CM | POA: Diagnosis not present

## 2020-01-03 DIAGNOSIS — N1832 Chronic kidney disease, stage 3b: Secondary | ICD-10-CM | POA: Diagnosis not present

## 2020-01-03 DIAGNOSIS — I872 Venous insufficiency (chronic) (peripheral): Secondary | ICD-10-CM | POA: Diagnosis not present

## 2020-01-03 DIAGNOSIS — D631 Anemia in chronic kidney disease: Secondary | ICD-10-CM | POA: Diagnosis not present

## 2020-01-03 DIAGNOSIS — E1151 Type 2 diabetes mellitus with diabetic peripheral angiopathy without gangrene: Secondary | ICD-10-CM | POA: Diagnosis not present

## 2020-01-03 DIAGNOSIS — E1122 Type 2 diabetes mellitus with diabetic chronic kidney disease: Secondary | ICD-10-CM | POA: Diagnosis not present

## 2020-01-03 DIAGNOSIS — S42122D Displaced fracture of acromial process, left shoulder, subsequent encounter for fracture with routine healing: Secondary | ICD-10-CM | POA: Diagnosis not present

## 2020-01-03 DIAGNOSIS — I129 Hypertensive chronic kidney disease with stage 1 through stage 4 chronic kidney disease, or unspecified chronic kidney disease: Secondary | ICD-10-CM | POA: Diagnosis not present

## 2020-01-03 DIAGNOSIS — J449 Chronic obstructive pulmonary disease, unspecified: Secondary | ICD-10-CM | POA: Diagnosis not present

## 2020-01-03 DIAGNOSIS — E1142 Type 2 diabetes mellitus with diabetic polyneuropathy: Secondary | ICD-10-CM | POA: Diagnosis not present

## 2020-01-08 DIAGNOSIS — H353211 Exudative age-related macular degeneration, right eye, with active choroidal neovascularization: Secondary | ICD-10-CM | POA: Diagnosis not present

## 2020-01-09 DIAGNOSIS — N1832 Chronic kidney disease, stage 3b: Secondary | ICD-10-CM | POA: Diagnosis not present

## 2020-01-09 DIAGNOSIS — E1122 Type 2 diabetes mellitus with diabetic chronic kidney disease: Secondary | ICD-10-CM | POA: Diagnosis not present

## 2020-01-09 DIAGNOSIS — E1151 Type 2 diabetes mellitus with diabetic peripheral angiopathy without gangrene: Secondary | ICD-10-CM | POA: Diagnosis not present

## 2020-01-09 DIAGNOSIS — J449 Chronic obstructive pulmonary disease, unspecified: Secondary | ICD-10-CM | POA: Diagnosis not present

## 2020-01-09 DIAGNOSIS — I872 Venous insufficiency (chronic) (peripheral): Secondary | ICD-10-CM | POA: Diagnosis not present

## 2020-01-09 DIAGNOSIS — I129 Hypertensive chronic kidney disease with stage 1 through stage 4 chronic kidney disease, or unspecified chronic kidney disease: Secondary | ICD-10-CM | POA: Diagnosis not present

## 2020-01-09 DIAGNOSIS — S42122D Displaced fracture of acromial process, left shoulder, subsequent encounter for fracture with routine healing: Secondary | ICD-10-CM | POA: Diagnosis not present

## 2020-01-09 DIAGNOSIS — E1142 Type 2 diabetes mellitus with diabetic polyneuropathy: Secondary | ICD-10-CM | POA: Diagnosis not present

## 2020-01-09 DIAGNOSIS — D631 Anemia in chronic kidney disease: Secondary | ICD-10-CM | POA: Diagnosis not present

## 2020-01-09 IMAGING — DX DG ELBOW 2V*L*
2 series · 3 of 3 positions shown · non-contrast
Comparison: None.

CLINICAL DATA: Acute LEFT elbow pain following fall. Initial
encounter.

EXAM:
LEFT ELBOW - 2 VIEW

[Series 1: elbow ap · 0.14mm/px · 2 of 2 slices shown]
[im 1/2]
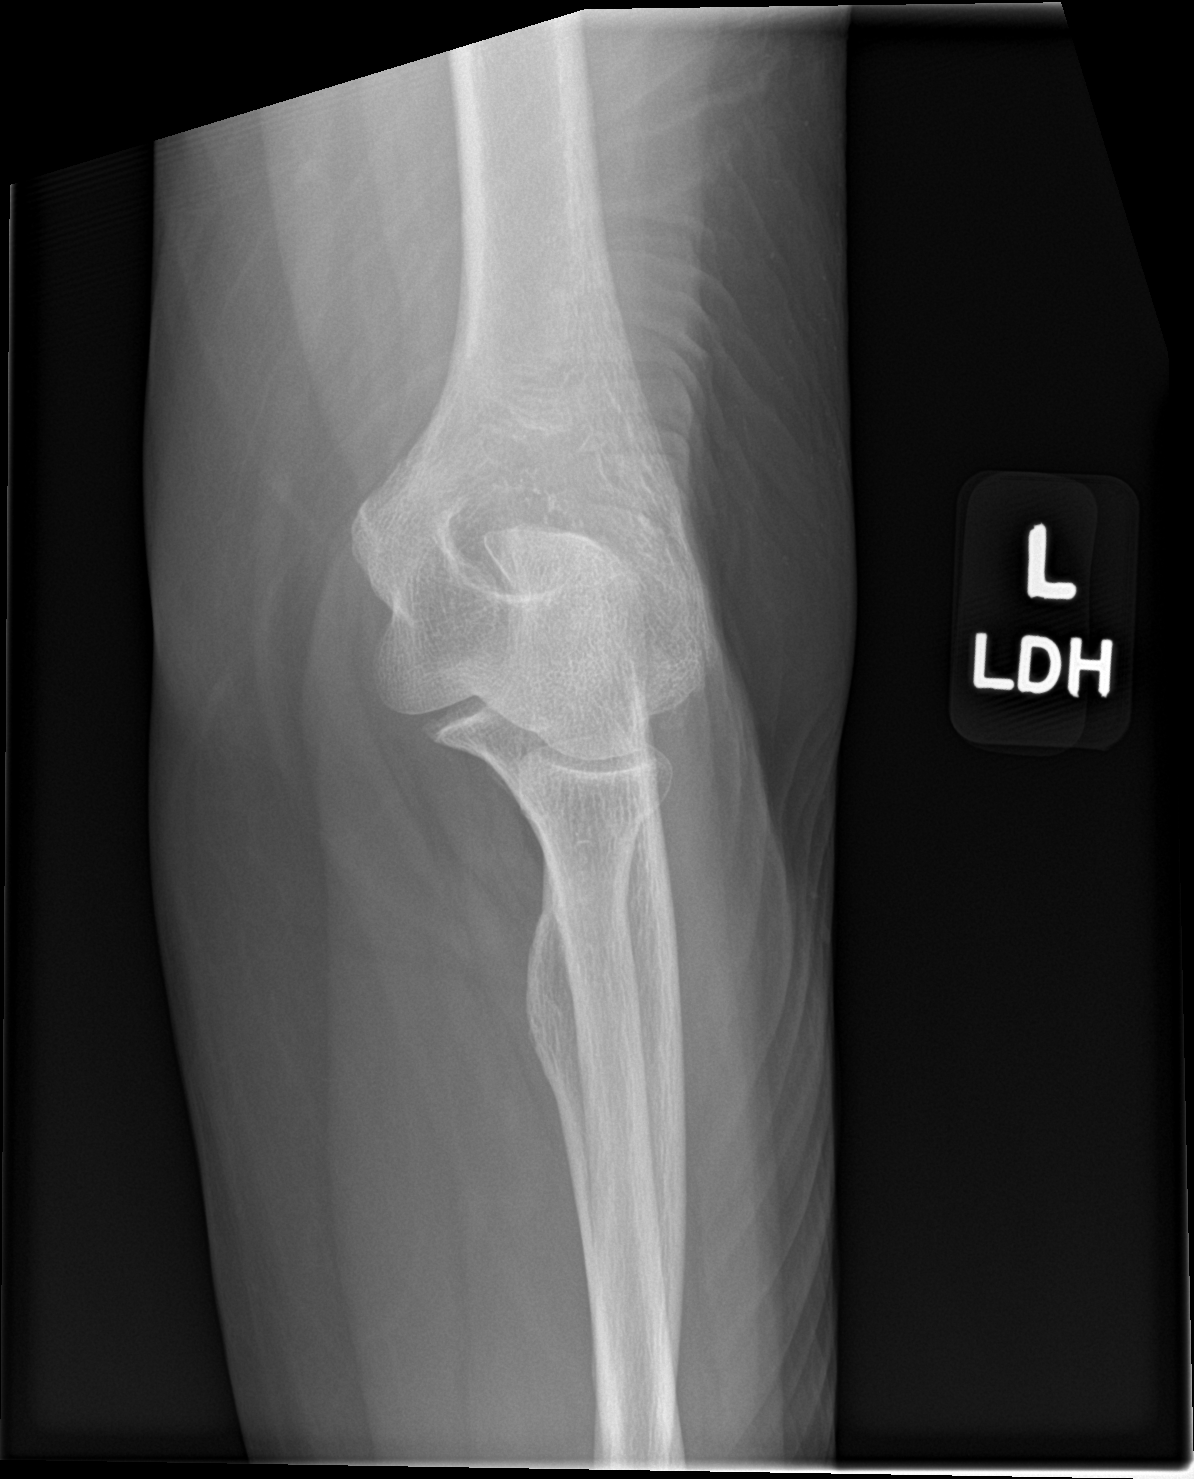
[im 2/2]
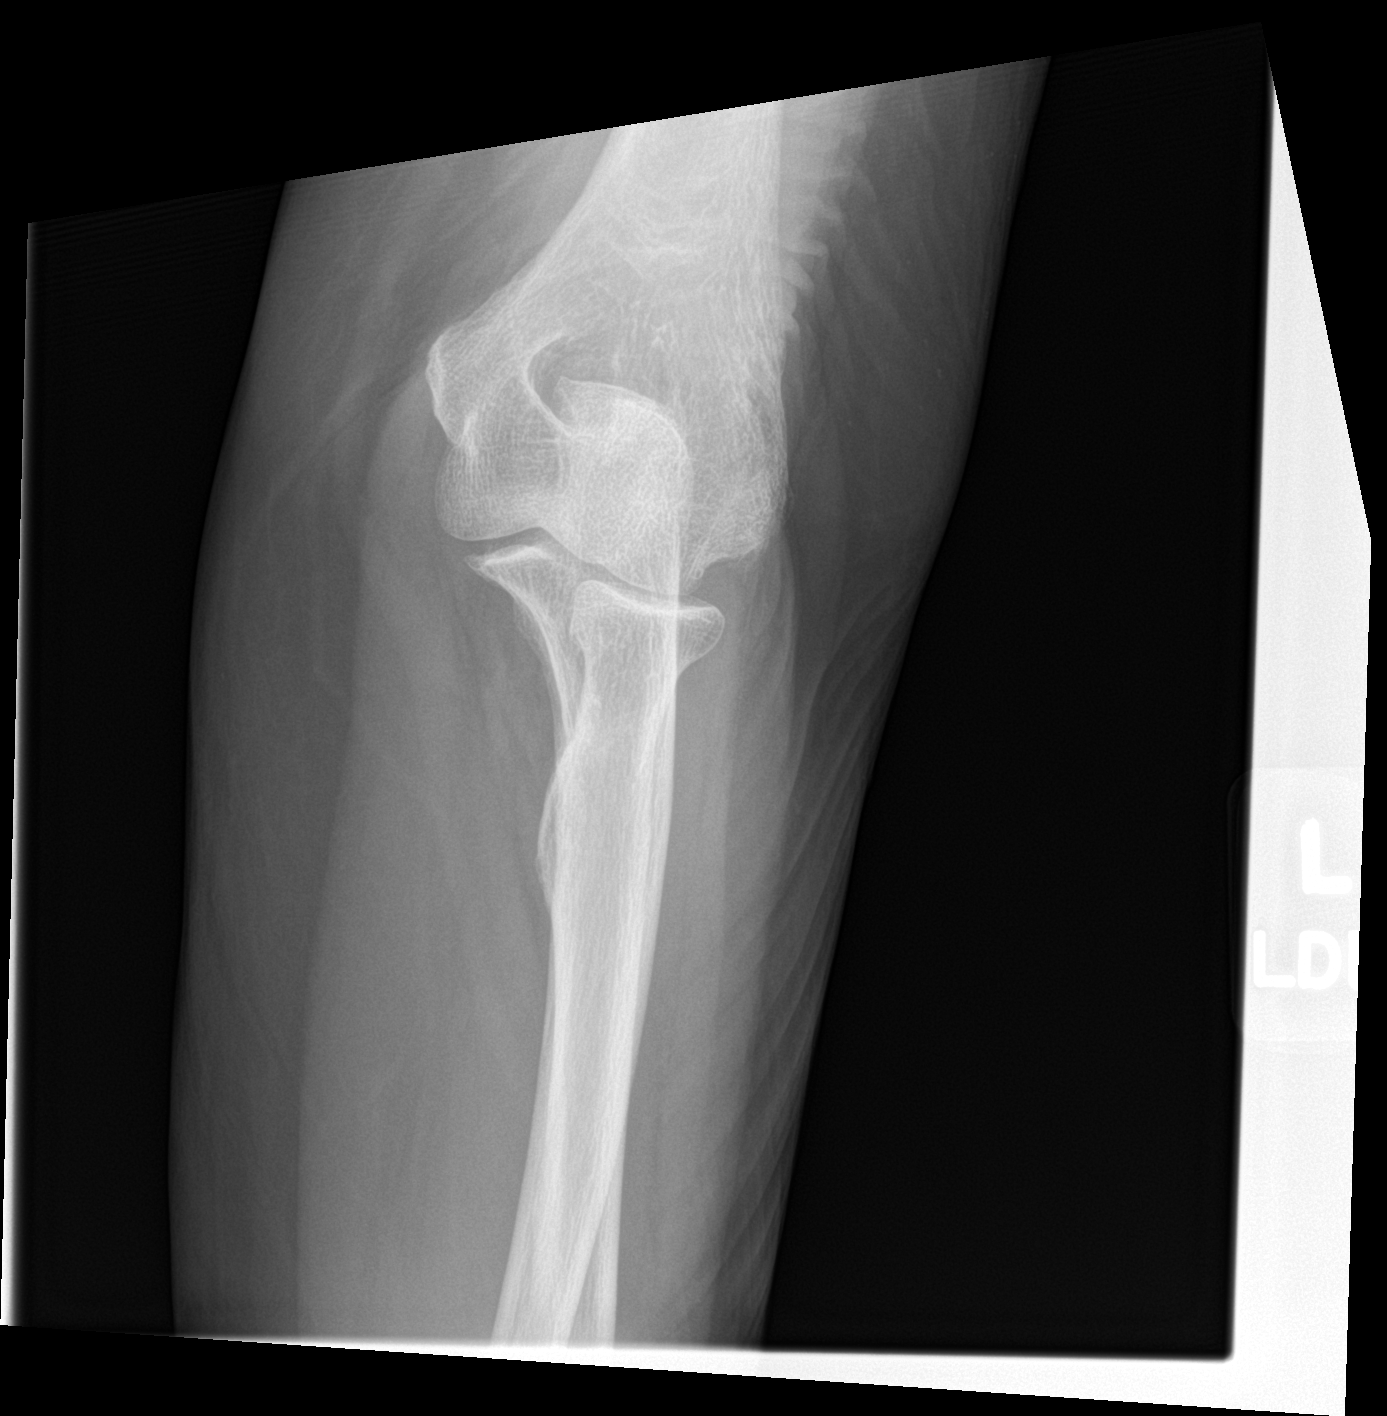

[elbow lat]
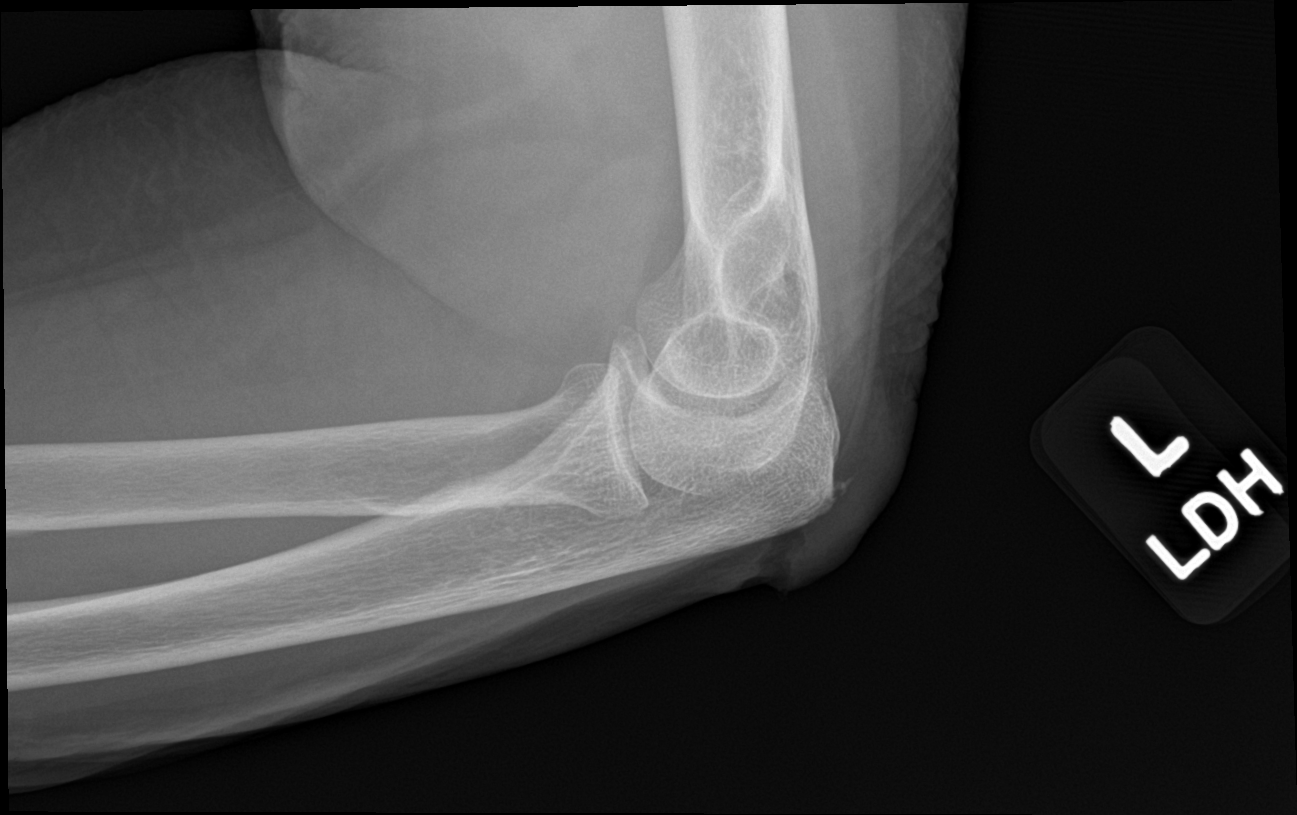

[3 of 3 positions shown; findings below may reference images not displayed]

FINDINGS: A soft tissue defect/laceration along the posterior elbow noted.

No radiopaque foreign bodies noted.

No acute fracture, subluxation or dislocation.

No joint effusion identified.
IMPRESSION: Soft tissue laceration along the posterior elbow. No acute bony
abnormality.

## 2020-01-09 IMAGING — CT CT CERVICAL SPINE W/O CM
3 series · 13 of 27 positions shown, 16 images · non-contrast
Comparison: Head CT 10/31/2016.

CLINICAL DATA: 78-year-old female with history of head trauma from
a fall.

EXAM:
CT HEAD WITHOUT CONTRAST
CT CERVICAL SPINE WITHOUT CONTRAST
TECHNIQUE: Multidetector CT imaging of the head and cervical spine was
performed following the standard protocol without intravenous
contrast. Multiplanar CT image reconstructions of the cervical spine
were also generated.

[Series 3: c spine soft · axial · 0.30mm/px · z∈[+95,+147]mm · 3 of 79 slices shown]
[im 14/79  soft-tissue]
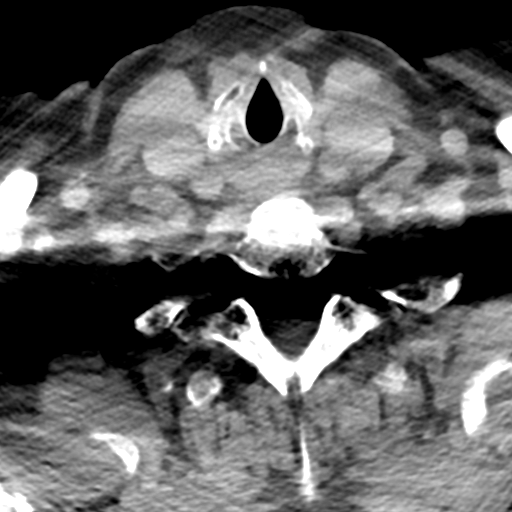
[im 27/79  soft-tissue]
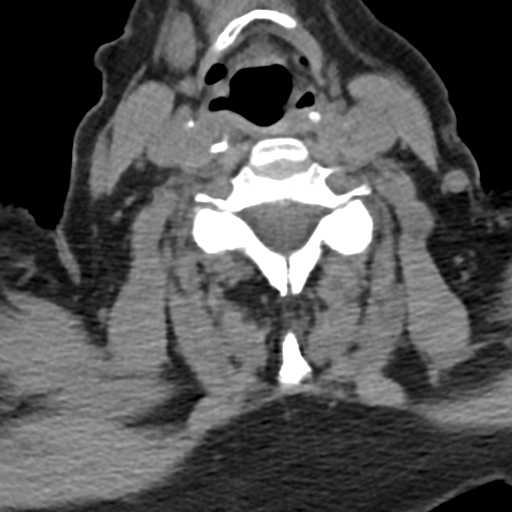
[im 40/79  soft-tissue]
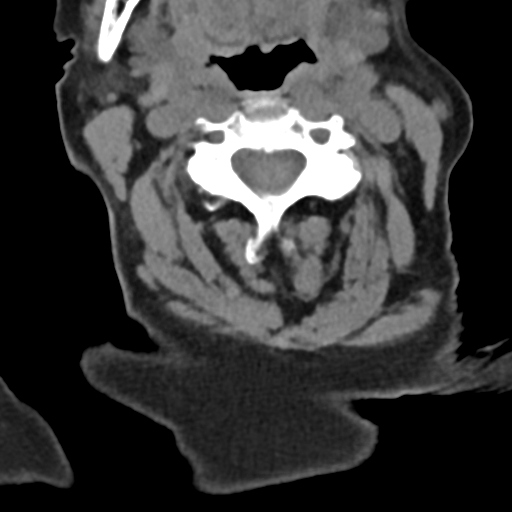

[Series 4: sagittal bone · sagittal · 0.22mm/px · 5 of 57 slices shown, 6 images]
[im 19/57  bone]
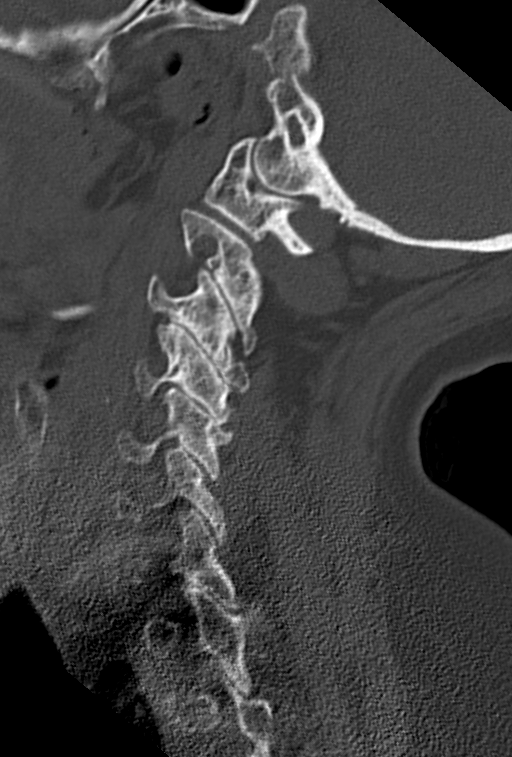
[im 24/57  bone]
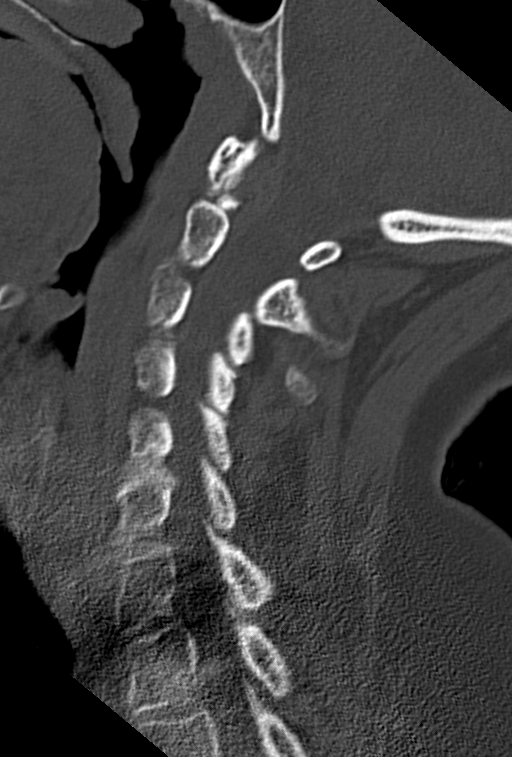
[im 29/57  soft-tissue]
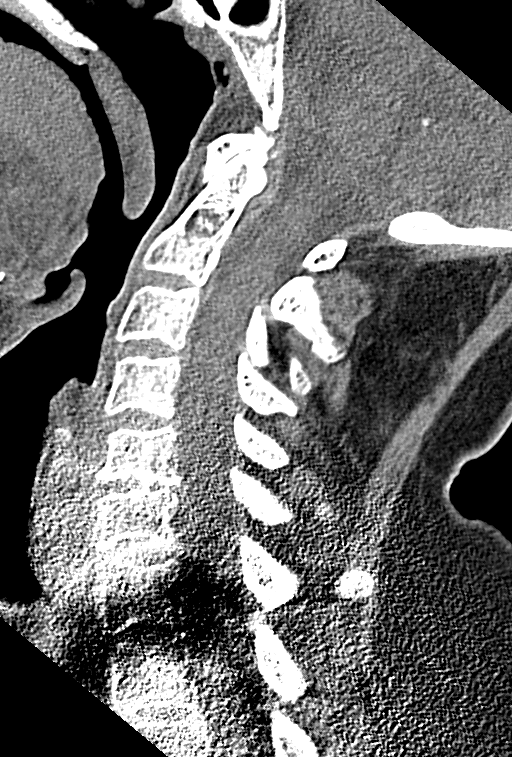
[im 29/57  bone]
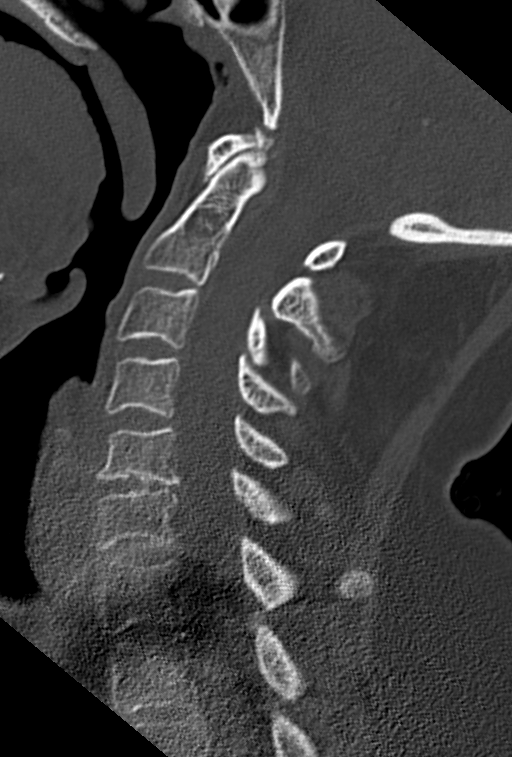
[im 33/57  bone]
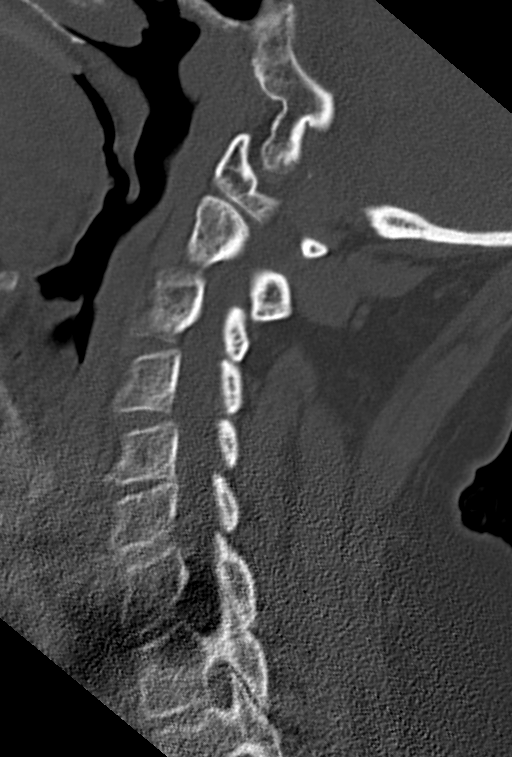
[im 38/57  bone]
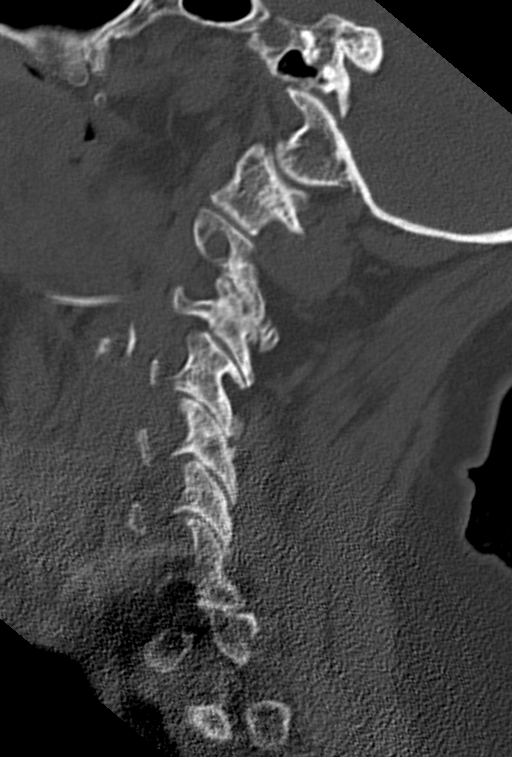

[Series 6: orthogonal bone · axial · 0.21mm/px · z∈[+66,+163]mm · 5 of 83 slices shown, 7 images]
[im 14/83  soft-tissue]
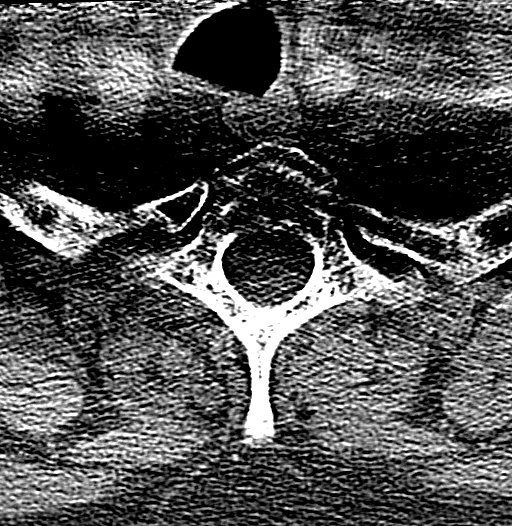
[im 14/83  bone]
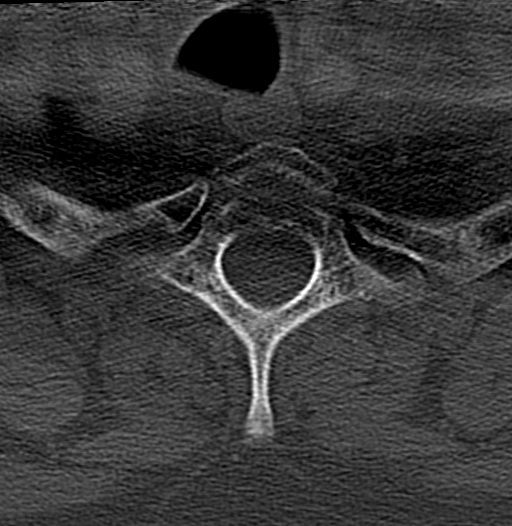
[im 28/83  bone]
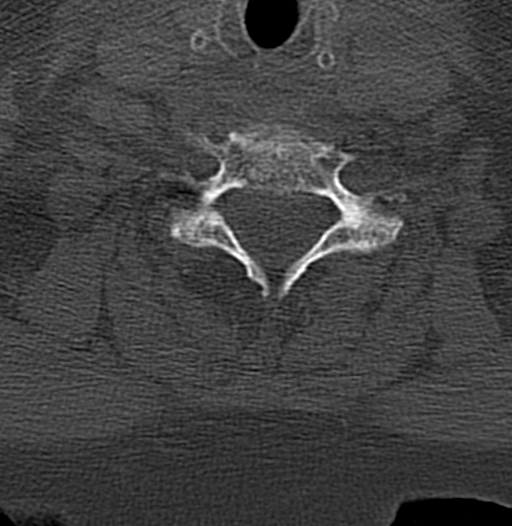
[im 42/83  bone]
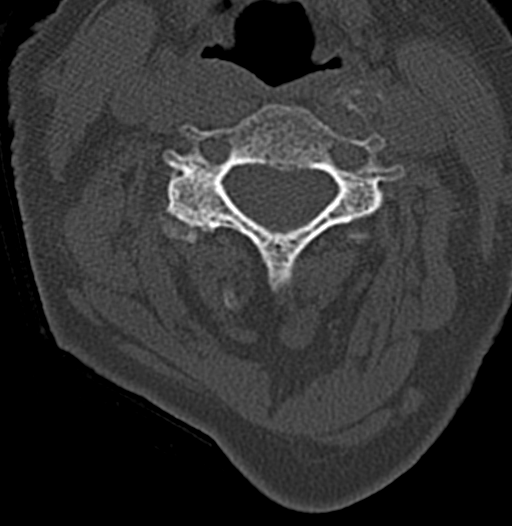
[im 55/83  bone]
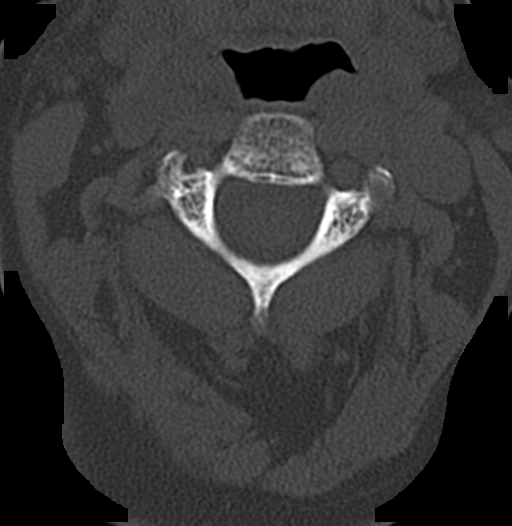
[im 69/83  soft-tissue]
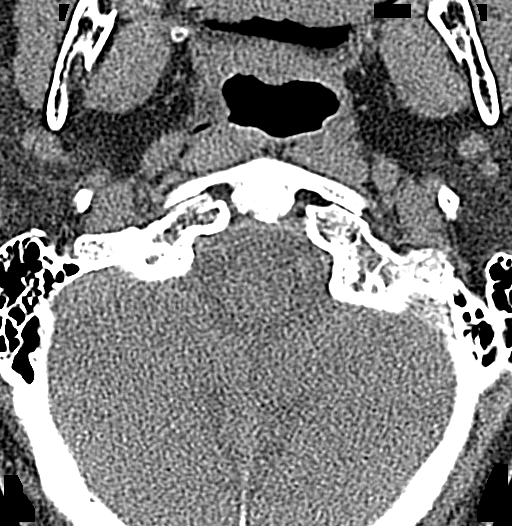
[im 69/83  bone]
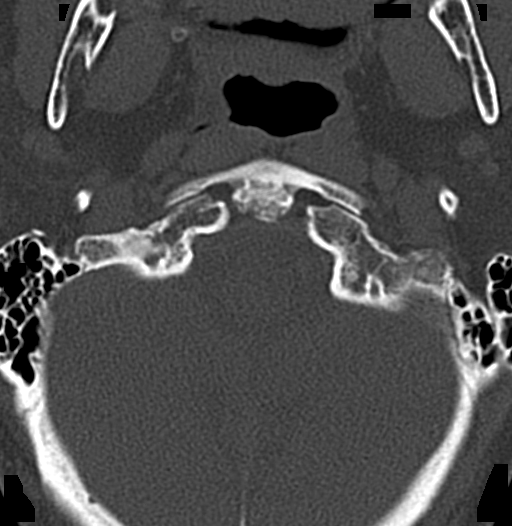

[13 of 27 positions shown; findings below may reference images not displayed]

FINDINGS: CT HEAD FINDINGS

Brain: Mild cerebral atrophy. Patchy and confluent areas of
decreased attenuation are noted throughout the deep and
periventricular white matter of the cerebral hemispheres
bilaterally, compatible with chronic microvascular ischemic disease.
No evidence of acute infarction, hemorrhage, hydrocephalus,
extra-axial collection or mass lesion/mass effect.

Vascular: No hyperdense vessel or unexpected calcification.

Skull: Normal. Negative for fracture or focal lesion.

Sinuses/Orbits: Mucoperiosteal thickening and intermediate
attenuation secretions in the right maxillary sinus and scattered
throughout the ethmoidal sinuses bilaterally.

Other: Large amount of high attenuation soft tissue swelling in the
frontal scalp measuring approximately 1.2 x 6.0 x 5.4 cm, compatible
with a large frontal scalp hematoma.

CT CERVICAL SPINE FINDINGS

Alignment: Normal.

Skull base and vertebrae: No acute fracture. No primary bone lesion
or focal pathologic process.

Soft tissues and spinal canal: No prevertebral fluid or swelling. No
visible canal hematoma.

Disc levels: Mild multilevel degenerative disc disease, most severe
at C5-C6. Moderate multilevel facet arthropathy.

Upper chest: Negative.

Other: None.
IMPRESSION: 1. Large frontal scalp hematoma. No underlying displaced skull
fracture or evidence of significant acute traumatic injury to the
brain.
2. No evidence of significant acute traumatic injury to the cervical
spine.
3. Mild cerebral atrophy with chronic microvascular ischemic changes
in the cerebral white matter, as above.
4. Chronic paranasal sinus disease, as above.

## 2020-01-12 DIAGNOSIS — J449 Chronic obstructive pulmonary disease, unspecified: Secondary | ICD-10-CM | POA: Diagnosis not present

## 2020-01-12 DIAGNOSIS — D631 Anemia in chronic kidney disease: Secondary | ICD-10-CM | POA: Diagnosis not present

## 2020-01-12 DIAGNOSIS — E1122 Type 2 diabetes mellitus with diabetic chronic kidney disease: Secondary | ICD-10-CM | POA: Diagnosis not present

## 2020-01-12 DIAGNOSIS — E1151 Type 2 diabetes mellitus with diabetic peripheral angiopathy without gangrene: Secondary | ICD-10-CM | POA: Diagnosis not present

## 2020-01-12 DIAGNOSIS — N1832 Chronic kidney disease, stage 3b: Secondary | ICD-10-CM | POA: Diagnosis not present

## 2020-01-12 DIAGNOSIS — S42122D Displaced fracture of acromial process, left shoulder, subsequent encounter for fracture with routine healing: Secondary | ICD-10-CM | POA: Diagnosis not present

## 2020-01-12 DIAGNOSIS — I129 Hypertensive chronic kidney disease with stage 1 through stage 4 chronic kidney disease, or unspecified chronic kidney disease: Secondary | ICD-10-CM | POA: Diagnosis not present

## 2020-01-12 DIAGNOSIS — I872 Venous insufficiency (chronic) (peripheral): Secondary | ICD-10-CM | POA: Diagnosis not present

## 2020-01-12 DIAGNOSIS — E1142 Type 2 diabetes mellitus with diabetic polyneuropathy: Secondary | ICD-10-CM | POA: Diagnosis not present

## 2020-01-16 DIAGNOSIS — I129 Hypertensive chronic kidney disease with stage 1 through stage 4 chronic kidney disease, or unspecified chronic kidney disease: Secondary | ICD-10-CM | POA: Diagnosis not present

## 2020-01-16 DIAGNOSIS — E1151 Type 2 diabetes mellitus with diabetic peripheral angiopathy without gangrene: Secondary | ICD-10-CM | POA: Diagnosis not present

## 2020-01-16 DIAGNOSIS — J449 Chronic obstructive pulmonary disease, unspecified: Secondary | ICD-10-CM | POA: Diagnosis not present

## 2020-01-16 DIAGNOSIS — E1142 Type 2 diabetes mellitus with diabetic polyneuropathy: Secondary | ICD-10-CM | POA: Diagnosis not present

## 2020-01-16 DIAGNOSIS — N1832 Chronic kidney disease, stage 3b: Secondary | ICD-10-CM | POA: Diagnosis not present

## 2020-01-16 DIAGNOSIS — D631 Anemia in chronic kidney disease: Secondary | ICD-10-CM | POA: Diagnosis not present

## 2020-01-16 DIAGNOSIS — S42122D Displaced fracture of acromial process, left shoulder, subsequent encounter for fracture with routine healing: Secondary | ICD-10-CM | POA: Diagnosis not present

## 2020-01-16 DIAGNOSIS — E1122 Type 2 diabetes mellitus with diabetic chronic kidney disease: Secondary | ICD-10-CM | POA: Diagnosis not present

## 2020-01-16 DIAGNOSIS — I872 Venous insufficiency (chronic) (peripheral): Secondary | ICD-10-CM | POA: Diagnosis not present

## 2020-01-18 DIAGNOSIS — M810 Age-related osteoporosis without current pathological fracture: Secondary | ICD-10-CM | POA: Diagnosis not present

## 2020-01-19 DIAGNOSIS — I129 Hypertensive chronic kidney disease with stage 1 through stage 4 chronic kidney disease, or unspecified chronic kidney disease: Secondary | ICD-10-CM | POA: Diagnosis not present

## 2020-01-19 DIAGNOSIS — E1122 Type 2 diabetes mellitus with diabetic chronic kidney disease: Secondary | ICD-10-CM | POA: Diagnosis not present

## 2020-01-19 DIAGNOSIS — J449 Chronic obstructive pulmonary disease, unspecified: Secondary | ICD-10-CM | POA: Diagnosis not present

## 2020-01-19 DIAGNOSIS — E1151 Type 2 diabetes mellitus with diabetic peripheral angiopathy without gangrene: Secondary | ICD-10-CM | POA: Diagnosis not present

## 2020-01-19 DIAGNOSIS — S42122D Displaced fracture of acromial process, left shoulder, subsequent encounter for fracture with routine healing: Secondary | ICD-10-CM | POA: Diagnosis not present

## 2020-01-19 DIAGNOSIS — N1832 Chronic kidney disease, stage 3b: Secondary | ICD-10-CM | POA: Diagnosis not present

## 2020-01-19 DIAGNOSIS — E1142 Type 2 diabetes mellitus with diabetic polyneuropathy: Secondary | ICD-10-CM | POA: Diagnosis not present

## 2020-01-19 DIAGNOSIS — D631 Anemia in chronic kidney disease: Secondary | ICD-10-CM | POA: Diagnosis not present

## 2020-01-19 DIAGNOSIS — I872 Venous insufficiency (chronic) (peripheral): Secondary | ICD-10-CM | POA: Diagnosis not present

## 2020-01-22 DIAGNOSIS — S42122D Displaced fracture of acromial process, left shoulder, subsequent encounter for fracture with routine healing: Secondary | ICD-10-CM | POA: Diagnosis not present

## 2020-01-22 DIAGNOSIS — E1122 Type 2 diabetes mellitus with diabetic chronic kidney disease: Secondary | ICD-10-CM | POA: Diagnosis not present

## 2020-01-22 DIAGNOSIS — E1142 Type 2 diabetes mellitus with diabetic polyneuropathy: Secondary | ICD-10-CM | POA: Diagnosis not present

## 2020-01-22 DIAGNOSIS — E1151 Type 2 diabetes mellitus with diabetic peripheral angiopathy without gangrene: Secondary | ICD-10-CM | POA: Diagnosis not present

## 2020-01-22 DIAGNOSIS — D631 Anemia in chronic kidney disease: Secondary | ICD-10-CM | POA: Diagnosis not present

## 2020-01-22 DIAGNOSIS — I129 Hypertensive chronic kidney disease with stage 1 through stage 4 chronic kidney disease, or unspecified chronic kidney disease: Secondary | ICD-10-CM | POA: Diagnosis not present

## 2020-01-22 DIAGNOSIS — I872 Venous insufficiency (chronic) (peripheral): Secondary | ICD-10-CM | POA: Diagnosis not present

## 2020-01-22 DIAGNOSIS — J449 Chronic obstructive pulmonary disease, unspecified: Secondary | ICD-10-CM | POA: Diagnosis not present

## 2020-01-22 DIAGNOSIS — N1832 Chronic kidney disease, stage 3b: Secondary | ICD-10-CM | POA: Diagnosis not present

## 2020-01-23 DIAGNOSIS — S42122K Displaced fracture of acromial process, left shoulder, subsequent encounter for fracture with nonunion: Secondary | ICD-10-CM | POA: Diagnosis not present

## 2020-01-24 DIAGNOSIS — E1151 Type 2 diabetes mellitus with diabetic peripheral angiopathy without gangrene: Secondary | ICD-10-CM | POA: Diagnosis not present

## 2020-01-24 DIAGNOSIS — I129 Hypertensive chronic kidney disease with stage 1 through stage 4 chronic kidney disease, or unspecified chronic kidney disease: Secondary | ICD-10-CM | POA: Diagnosis not present

## 2020-01-24 DIAGNOSIS — J449 Chronic obstructive pulmonary disease, unspecified: Secondary | ICD-10-CM | POA: Diagnosis not present

## 2020-01-24 DIAGNOSIS — N1832 Chronic kidney disease, stage 3b: Secondary | ICD-10-CM | POA: Diagnosis not present

## 2020-01-24 DIAGNOSIS — E1142 Type 2 diabetes mellitus with diabetic polyneuropathy: Secondary | ICD-10-CM | POA: Diagnosis not present

## 2020-01-24 DIAGNOSIS — D631 Anemia in chronic kidney disease: Secondary | ICD-10-CM | POA: Diagnosis not present

## 2020-01-24 DIAGNOSIS — E1122 Type 2 diabetes mellitus with diabetic chronic kidney disease: Secondary | ICD-10-CM | POA: Diagnosis not present

## 2020-01-24 DIAGNOSIS — S42122D Displaced fracture of acromial process, left shoulder, subsequent encounter for fracture with routine healing: Secondary | ICD-10-CM | POA: Diagnosis not present

## 2020-01-24 DIAGNOSIS — I872 Venous insufficiency (chronic) (peripheral): Secondary | ICD-10-CM | POA: Diagnosis not present

## 2020-01-25 DIAGNOSIS — J849 Interstitial pulmonary disease, unspecified: Secondary | ICD-10-CM | POA: Diagnosis not present

## 2020-01-25 DIAGNOSIS — J449 Chronic obstructive pulmonary disease, unspecified: Secondary | ICD-10-CM | POA: Diagnosis not present

## 2020-01-29 DIAGNOSIS — S42122K Displaced fracture of acromial process, left shoulder, subsequent encounter for fracture with nonunion: Secondary | ICD-10-CM | POA: Diagnosis not present

## 2020-01-30 DIAGNOSIS — S42122D Displaced fracture of acromial process, left shoulder, subsequent encounter for fracture with routine healing: Secondary | ICD-10-CM | POA: Diagnosis not present

## 2020-01-30 DIAGNOSIS — E1151 Type 2 diabetes mellitus with diabetic peripheral angiopathy without gangrene: Secondary | ICD-10-CM | POA: Diagnosis not present

## 2020-01-30 DIAGNOSIS — D631 Anemia in chronic kidney disease: Secondary | ICD-10-CM | POA: Diagnosis not present

## 2020-01-30 DIAGNOSIS — E1122 Type 2 diabetes mellitus with diabetic chronic kidney disease: Secondary | ICD-10-CM | POA: Diagnosis not present

## 2020-01-30 DIAGNOSIS — I872 Venous insufficiency (chronic) (peripheral): Secondary | ICD-10-CM | POA: Diagnosis not present

## 2020-01-30 DIAGNOSIS — E1142 Type 2 diabetes mellitus with diabetic polyneuropathy: Secondary | ICD-10-CM | POA: Diagnosis not present

## 2020-01-30 DIAGNOSIS — I129 Hypertensive chronic kidney disease with stage 1 through stage 4 chronic kidney disease, or unspecified chronic kidney disease: Secondary | ICD-10-CM | POA: Diagnosis not present

## 2020-01-30 DIAGNOSIS — J449 Chronic obstructive pulmonary disease, unspecified: Secondary | ICD-10-CM | POA: Diagnosis not present

## 2020-01-30 DIAGNOSIS — N1832 Chronic kidney disease, stage 3b: Secondary | ICD-10-CM | POA: Diagnosis not present

## 2020-01-31 DIAGNOSIS — J449 Chronic obstructive pulmonary disease, unspecified: Secondary | ICD-10-CM | POA: Diagnosis not present

## 2020-01-31 DIAGNOSIS — I129 Hypertensive chronic kidney disease with stage 1 through stage 4 chronic kidney disease, or unspecified chronic kidney disease: Secondary | ICD-10-CM | POA: Diagnosis not present

## 2020-01-31 DIAGNOSIS — S42122D Displaced fracture of acromial process, left shoulder, subsequent encounter for fracture with routine healing: Secondary | ICD-10-CM | POA: Diagnosis not present

## 2020-01-31 DIAGNOSIS — I872 Venous insufficiency (chronic) (peripheral): Secondary | ICD-10-CM | POA: Diagnosis not present

## 2020-01-31 DIAGNOSIS — E1122 Type 2 diabetes mellitus with diabetic chronic kidney disease: Secondary | ICD-10-CM | POA: Diagnosis not present

## 2020-01-31 DIAGNOSIS — N1832 Chronic kidney disease, stage 3b: Secondary | ICD-10-CM | POA: Diagnosis not present

## 2020-01-31 DIAGNOSIS — E1151 Type 2 diabetes mellitus with diabetic peripheral angiopathy without gangrene: Secondary | ICD-10-CM | POA: Diagnosis not present

## 2020-01-31 DIAGNOSIS — E1142 Type 2 diabetes mellitus with diabetic polyneuropathy: Secondary | ICD-10-CM | POA: Diagnosis not present

## 2020-01-31 DIAGNOSIS — D631 Anemia in chronic kidney disease: Secondary | ICD-10-CM | POA: Diagnosis not present

## 2020-02-01 DIAGNOSIS — E1142 Type 2 diabetes mellitus with diabetic polyneuropathy: Secondary | ICD-10-CM | POA: Diagnosis not present

## 2020-02-01 DIAGNOSIS — S42122D Displaced fracture of acromial process, left shoulder, subsequent encounter for fracture with routine healing: Secondary | ICD-10-CM | POA: Diagnosis not present

## 2020-02-01 DIAGNOSIS — E1122 Type 2 diabetes mellitus with diabetic chronic kidney disease: Secondary | ICD-10-CM | POA: Diagnosis not present

## 2020-02-01 DIAGNOSIS — D631 Anemia in chronic kidney disease: Secondary | ICD-10-CM | POA: Diagnosis not present

## 2020-02-01 DIAGNOSIS — E1151 Type 2 diabetes mellitus with diabetic peripheral angiopathy without gangrene: Secondary | ICD-10-CM | POA: Diagnosis not present

## 2020-02-01 DIAGNOSIS — N1832 Chronic kidney disease, stage 3b: Secondary | ICD-10-CM | POA: Diagnosis not present

## 2020-02-01 DIAGNOSIS — I872 Venous insufficiency (chronic) (peripheral): Secondary | ICD-10-CM | POA: Diagnosis not present

## 2020-02-01 DIAGNOSIS — J449 Chronic obstructive pulmonary disease, unspecified: Secondary | ICD-10-CM | POA: Diagnosis not present

## 2020-02-01 DIAGNOSIS — I129 Hypertensive chronic kidney disease with stage 1 through stage 4 chronic kidney disease, or unspecified chronic kidney disease: Secondary | ICD-10-CM | POA: Diagnosis not present

## 2020-02-05 DIAGNOSIS — E1151 Type 2 diabetes mellitus with diabetic peripheral angiopathy without gangrene: Secondary | ICD-10-CM | POA: Diagnosis not present

## 2020-02-05 DIAGNOSIS — D631 Anemia in chronic kidney disease: Secondary | ICD-10-CM | POA: Diagnosis not present

## 2020-02-05 DIAGNOSIS — E1142 Type 2 diabetes mellitus with diabetic polyneuropathy: Secondary | ICD-10-CM | POA: Diagnosis not present

## 2020-02-05 DIAGNOSIS — J449 Chronic obstructive pulmonary disease, unspecified: Secondary | ICD-10-CM | POA: Diagnosis not present

## 2020-02-05 DIAGNOSIS — I129 Hypertensive chronic kidney disease with stage 1 through stage 4 chronic kidney disease, or unspecified chronic kidney disease: Secondary | ICD-10-CM | POA: Diagnosis not present

## 2020-02-05 DIAGNOSIS — I872 Venous insufficiency (chronic) (peripheral): Secondary | ICD-10-CM | POA: Diagnosis not present

## 2020-02-05 DIAGNOSIS — E1122 Type 2 diabetes mellitus with diabetic chronic kidney disease: Secondary | ICD-10-CM | POA: Diagnosis not present

## 2020-02-05 DIAGNOSIS — S42122D Displaced fracture of acromial process, left shoulder, subsequent encounter for fracture with routine healing: Secondary | ICD-10-CM | POA: Diagnosis not present

## 2020-02-05 DIAGNOSIS — N1832 Chronic kidney disease, stage 3b: Secondary | ICD-10-CM | POA: Diagnosis not present

## 2020-02-07 DIAGNOSIS — I872 Venous insufficiency (chronic) (peripheral): Secondary | ICD-10-CM | POA: Diagnosis not present

## 2020-02-07 DIAGNOSIS — E1122 Type 2 diabetes mellitus with diabetic chronic kidney disease: Secondary | ICD-10-CM | POA: Diagnosis not present

## 2020-02-07 DIAGNOSIS — J449 Chronic obstructive pulmonary disease, unspecified: Secondary | ICD-10-CM | POA: Diagnosis not present

## 2020-02-07 DIAGNOSIS — I129 Hypertensive chronic kidney disease with stage 1 through stage 4 chronic kidney disease, or unspecified chronic kidney disease: Secondary | ICD-10-CM | POA: Diagnosis not present

## 2020-02-07 DIAGNOSIS — E1142 Type 2 diabetes mellitus with diabetic polyneuropathy: Secondary | ICD-10-CM | POA: Diagnosis not present

## 2020-02-07 DIAGNOSIS — S42122D Displaced fracture of acromial process, left shoulder, subsequent encounter for fracture with routine healing: Secondary | ICD-10-CM | POA: Diagnosis not present

## 2020-02-07 DIAGNOSIS — E1151 Type 2 diabetes mellitus with diabetic peripheral angiopathy without gangrene: Secondary | ICD-10-CM | POA: Diagnosis not present

## 2020-02-07 DIAGNOSIS — N1832 Chronic kidney disease, stage 3b: Secondary | ICD-10-CM | POA: Diagnosis not present

## 2020-02-07 DIAGNOSIS — D631 Anemia in chronic kidney disease: Secondary | ICD-10-CM | POA: Diagnosis not present

## 2020-02-13 ENCOUNTER — Other Ambulatory Visit: Payer: Self-pay | Admitting: Internal Medicine

## 2020-02-13 DIAGNOSIS — E1122 Type 2 diabetes mellitus with diabetic chronic kidney disease: Secondary | ICD-10-CM | POA: Diagnosis not present

## 2020-02-13 DIAGNOSIS — F419 Anxiety disorder, unspecified: Secondary | ICD-10-CM

## 2020-02-13 DIAGNOSIS — E1151 Type 2 diabetes mellitus with diabetic peripheral angiopathy without gangrene: Secondary | ICD-10-CM | POA: Diagnosis not present

## 2020-02-13 DIAGNOSIS — S42122D Displaced fracture of acromial process, left shoulder, subsequent encounter for fracture with routine healing: Secondary | ICD-10-CM | POA: Diagnosis not present

## 2020-02-13 DIAGNOSIS — D631 Anemia in chronic kidney disease: Secondary | ICD-10-CM | POA: Diagnosis not present

## 2020-02-13 DIAGNOSIS — E1142 Type 2 diabetes mellitus with diabetic polyneuropathy: Secondary | ICD-10-CM | POA: Diagnosis not present

## 2020-02-13 DIAGNOSIS — I129 Hypertensive chronic kidney disease with stage 1 through stage 4 chronic kidney disease, or unspecified chronic kidney disease: Secondary | ICD-10-CM | POA: Diagnosis not present

## 2020-02-13 DIAGNOSIS — N1832 Chronic kidney disease, stage 3b: Secondary | ICD-10-CM | POA: Diagnosis not present

## 2020-02-13 DIAGNOSIS — G4700 Insomnia, unspecified: Secondary | ICD-10-CM

## 2020-02-13 DIAGNOSIS — I872 Venous insufficiency (chronic) (peripheral): Secondary | ICD-10-CM | POA: Diagnosis not present

## 2020-02-13 DIAGNOSIS — J449 Chronic obstructive pulmonary disease, unspecified: Secondary | ICD-10-CM | POA: Diagnosis not present

## 2020-02-13 MED ORDER — ALPRAZOLAM 0.25 MG PO TABS: 0.2500 mg | ORAL_TABLET | Freq: Every evening | ORAL | 2 refills | Status: DC | PRN

## 2020-02-13 NOTE — Telephone Encounter (Signed)
Refill request for Alprazolam 0.25 mg tabs.  LOV- 12/06/19 Next OV- 04/10/2020 Last refill- 12/06/19

## 2020-02-16 DIAGNOSIS — N1832 Chronic kidney disease, stage 3b: Secondary | ICD-10-CM | POA: Diagnosis not present

## 2020-02-16 DIAGNOSIS — E1151 Type 2 diabetes mellitus with diabetic peripheral angiopathy without gangrene: Secondary | ICD-10-CM | POA: Diagnosis not present

## 2020-02-16 DIAGNOSIS — E1122 Type 2 diabetes mellitus with diabetic chronic kidney disease: Secondary | ICD-10-CM | POA: Diagnosis not present

## 2020-02-16 DIAGNOSIS — D631 Anemia in chronic kidney disease: Secondary | ICD-10-CM | POA: Diagnosis not present

## 2020-02-16 DIAGNOSIS — I129 Hypertensive chronic kidney disease with stage 1 through stage 4 chronic kidney disease, or unspecified chronic kidney disease: Secondary | ICD-10-CM | POA: Diagnosis not present

## 2020-02-16 DIAGNOSIS — S42122D Displaced fracture of acromial process, left shoulder, subsequent encounter for fracture with routine healing: Secondary | ICD-10-CM | POA: Diagnosis not present

## 2020-02-16 DIAGNOSIS — J449 Chronic obstructive pulmonary disease, unspecified: Secondary | ICD-10-CM | POA: Diagnosis not present

## 2020-02-16 DIAGNOSIS — I872 Venous insufficiency (chronic) (peripheral): Secondary | ICD-10-CM | POA: Diagnosis not present

## 2020-02-16 DIAGNOSIS — E1142 Type 2 diabetes mellitus with diabetic polyneuropathy: Secondary | ICD-10-CM | POA: Diagnosis not present

## 2020-02-17 ENCOUNTER — Other Ambulatory Visit: Payer: Self-pay | Admitting: Urology

## 2020-02-20 ENCOUNTER — Telehealth (INDEPENDENT_AMBULATORY_CARE_PROVIDER_SITE_OTHER): Payer: Medicare PPO | Admitting: Psychiatry

## 2020-02-20 ENCOUNTER — Other Ambulatory Visit: Payer: Self-pay

## 2020-02-20 DIAGNOSIS — F3176 Bipolar disorder, in full remission, most recent episode depressed: Secondary | ICD-10-CM

## 2020-02-20 IMAGING — CT CT PELVIS W/O CM
3 of 5 series · 15 of 34 positions shown, 17 images · non-contrast
Comparison: 06/12/2016

CLINICAL DATA: Pain following fall 2 days ago

EXAM:
CT PELVIS WITHOUT CONTRAST
TECHNIQUE: Multidetector CT imaging of the pelvis was performed following the
standard protocol without intravenous contrast.

[Series 4: axial st · axial · 0.74mm/px · z∈[-969,-765]mm · 7 of 122 slices shown, 9 images]
[im 10/122  soft-tissue]
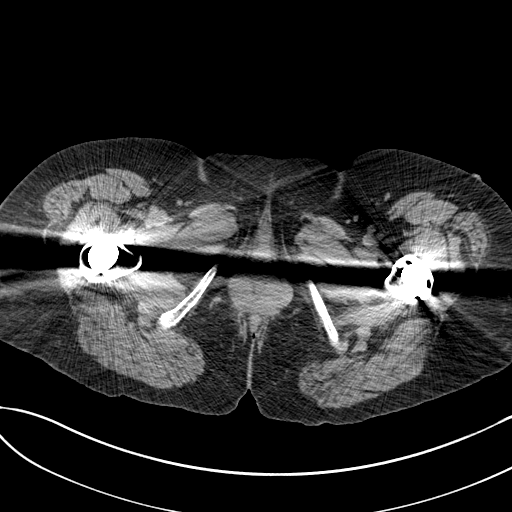
[im 10/122  bone]
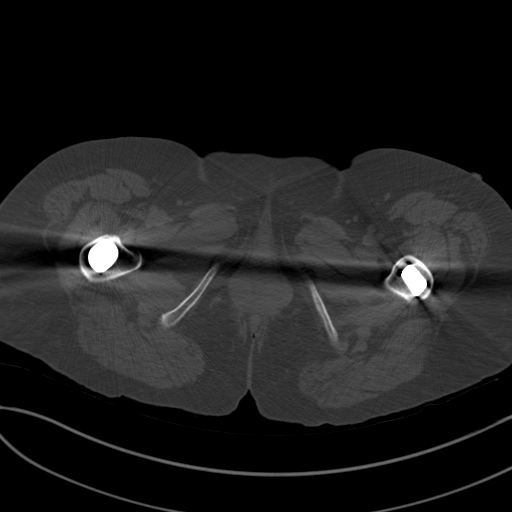
[im 28/122  bone]
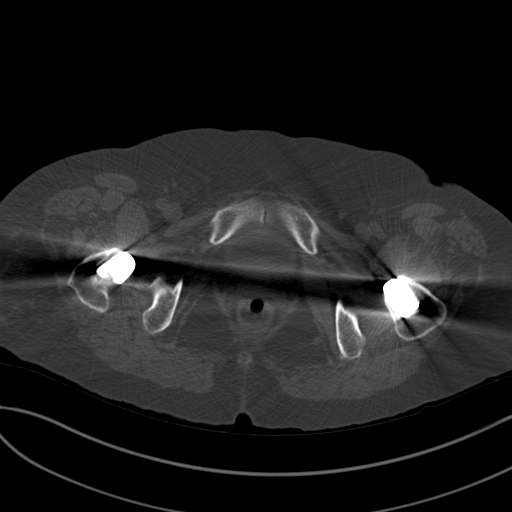
[im 47/122  bone]
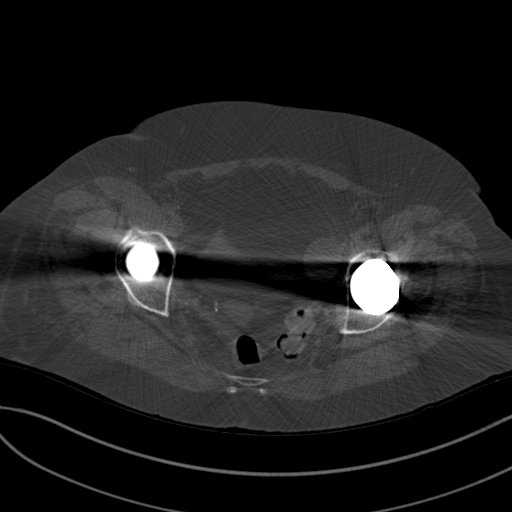
[im 66/122  bone]
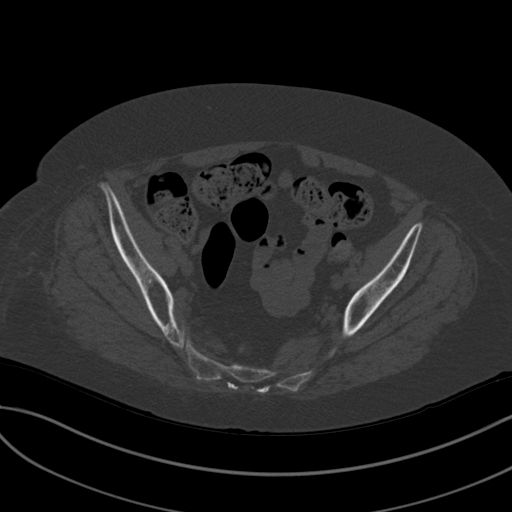
[im 75/122  soft-tissue]
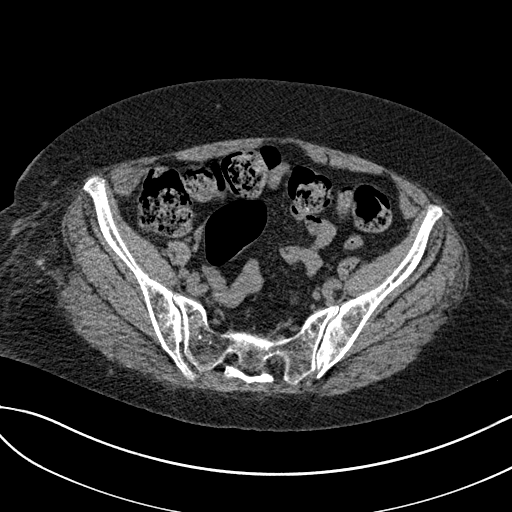
[im 75/122  bone]
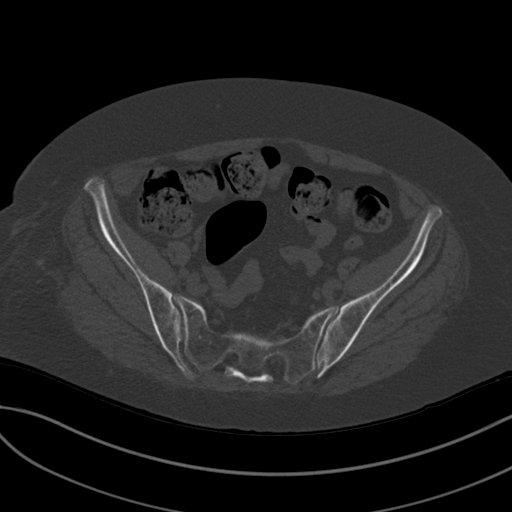
[im 94/122  bone]
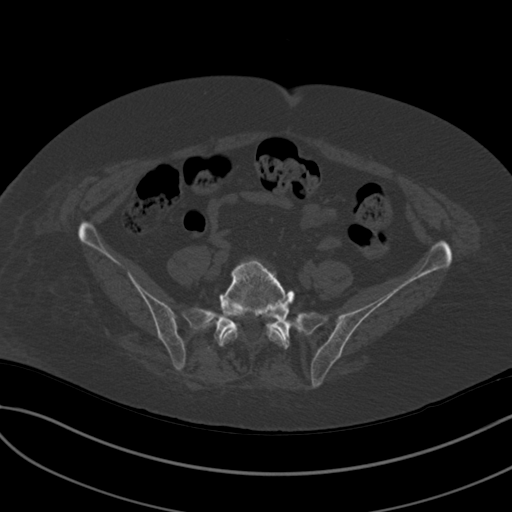
[im 112/122  bone]
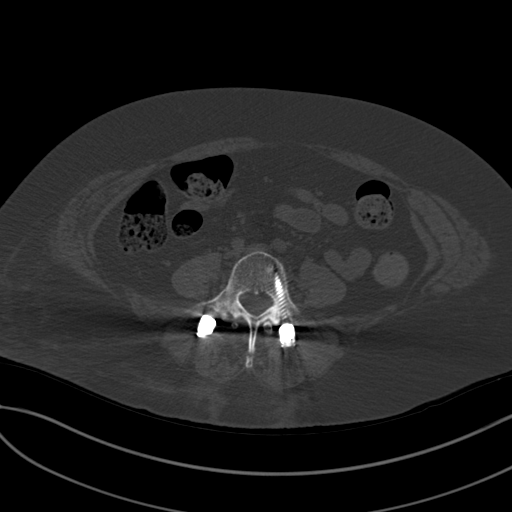

[Series 8: sagittal bone · sagittal · 0.50mm/px · 6 of 208 slices shown]
[im 30/208  bone]
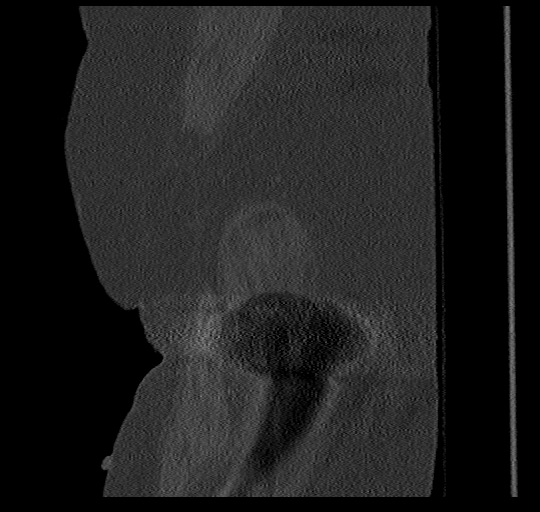
[im 60/208  bone]
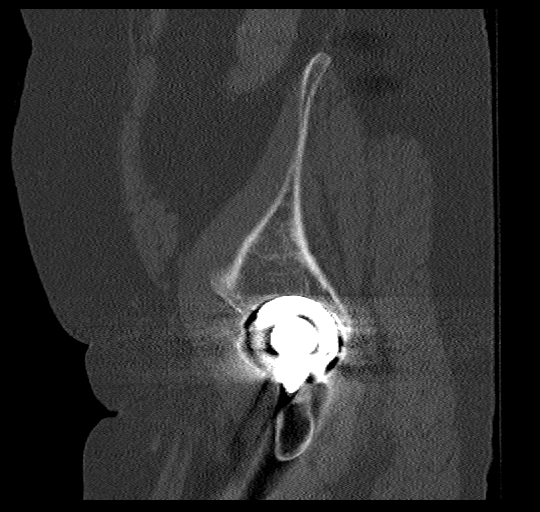
[im 89/208  bone]
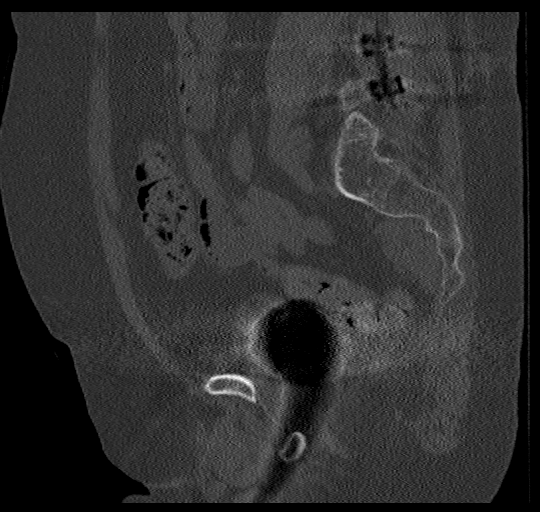
[im 119/208  bone]
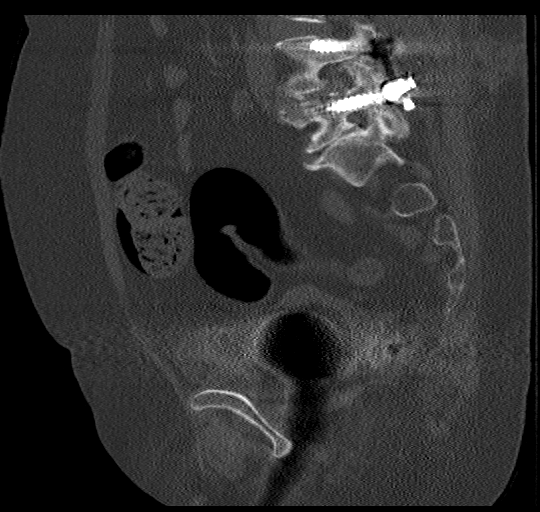
[im 148/208  bone]
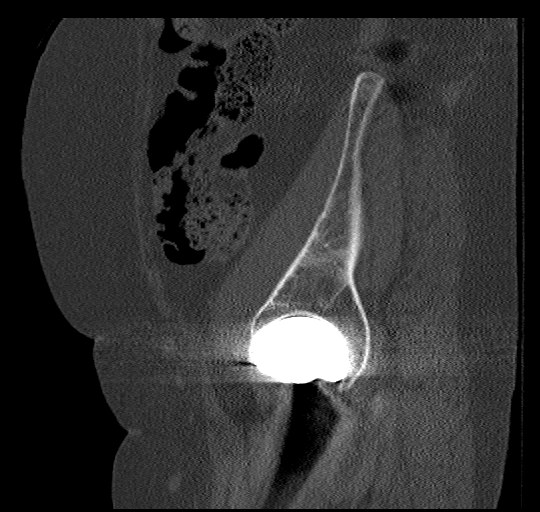
[im 183/208  soft-tissue]
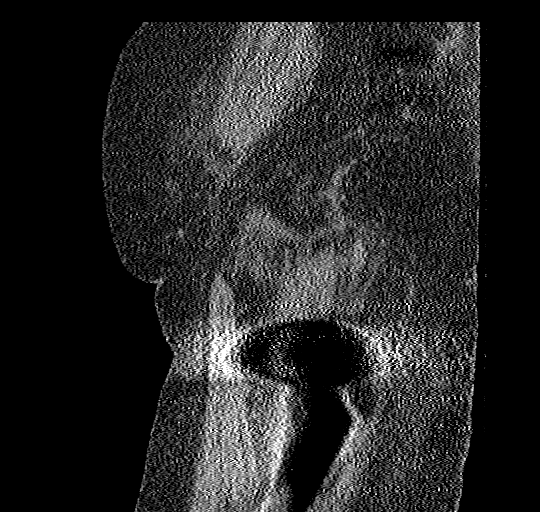

[Series 9: coronal st · coronal · 0.47mm/px · 2 of 116 slices shown]
[im 39/116  bone]
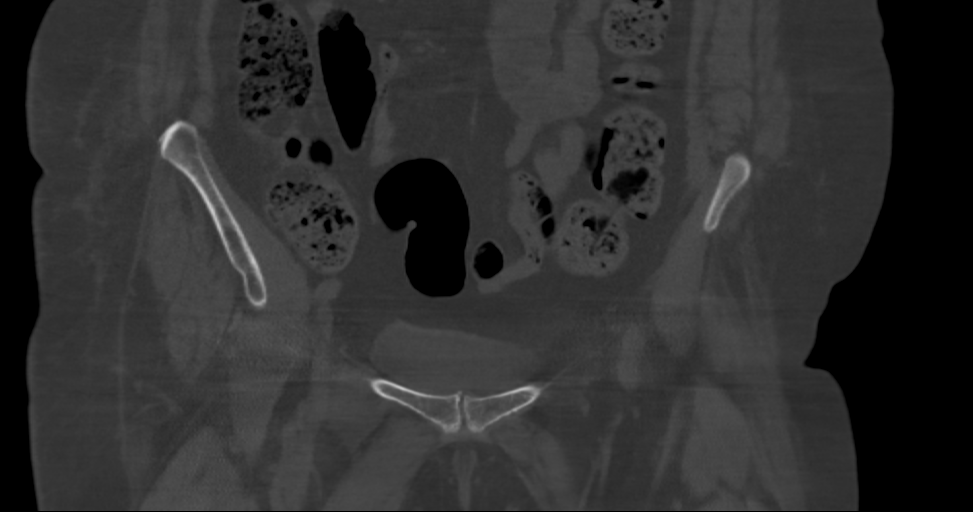
[im 77/116  bone]
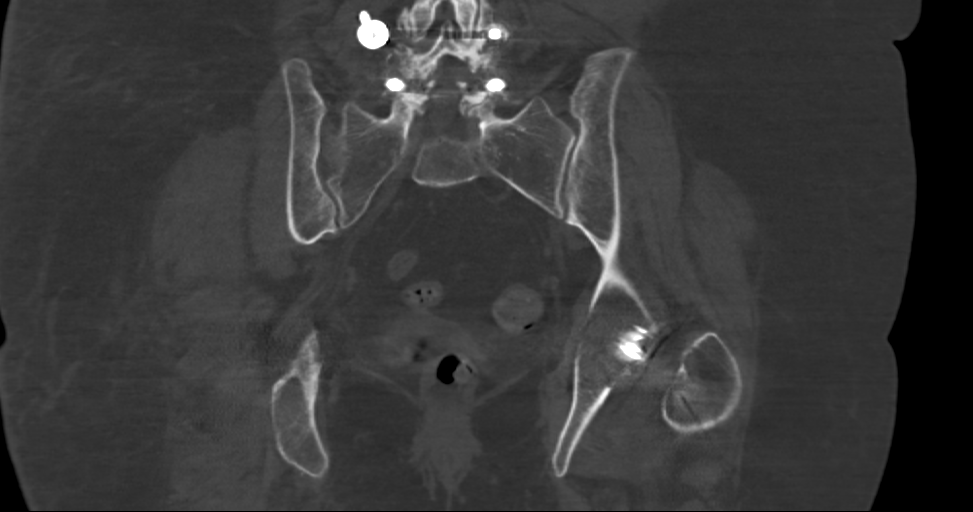

[15 of 34 positions shown; findings below may reference images not displayed]

FINDINGS: Urinary Tract: Limited assessment of the urinary bladder in the
setting of hip arthroplasty changes.

Bowel:  Visualized bowel is unremarkable.

Vascular/Lymphatic: Not well assessed due to lack of contrast. No
signs of adenopathy.

Reproductive: Cystic left adnexal lesion 3 by 2.4 cm not well
assessed due to streak artifact as well. This has enlarged since
2842 where it measured approximately 1.8 x 1.7 cm. Uterus remains in
place, difficult to assess.

Other:  None.

Musculoskeletal: Signs of spinal degenerative change and spinal
fusion, see dedicated CT lumbar spine for further detail.

Bone demineralization. Changes of hip arthroplasty bilaterally. Note
that images do not extend through the inferior aspect of the femoral
components. Correlate with plain film assessment of the same date.

No signs of fracture.
IMPRESSION: 1. No signs of fracture.
2. Cystic left adnexal lesion has enlarged since 2842 where it
measured approximately 1.8 x 1.7 cm. This is not well assessed due
to streak artifact as well as lack of contrast. Ultrasound follow-up
may be helpful in 8-12 weeks for further assessment as clinically
warranted.
3. Signs of spinal degenerative change and spinal fusion, see
dedicated CT lumbar spine for further detail.
4. Changes of hip arthroplasty bilaterally. Note that images do not
extend through the inferior aspect of the femoral components.
Correlate with plain film assessment of the same date.

## 2020-02-20 IMAGING — CR DG FEMUR 2+V*R*
4 series · 4 of 4 positions shown · non-contrast
Comparison: 02/21/2019, 12/12/2016

CLINICAL DATA: Fall, pain

EXAM:
RIGHT FEMUR 2 VIEWS

[femur ap (1 of 2)]
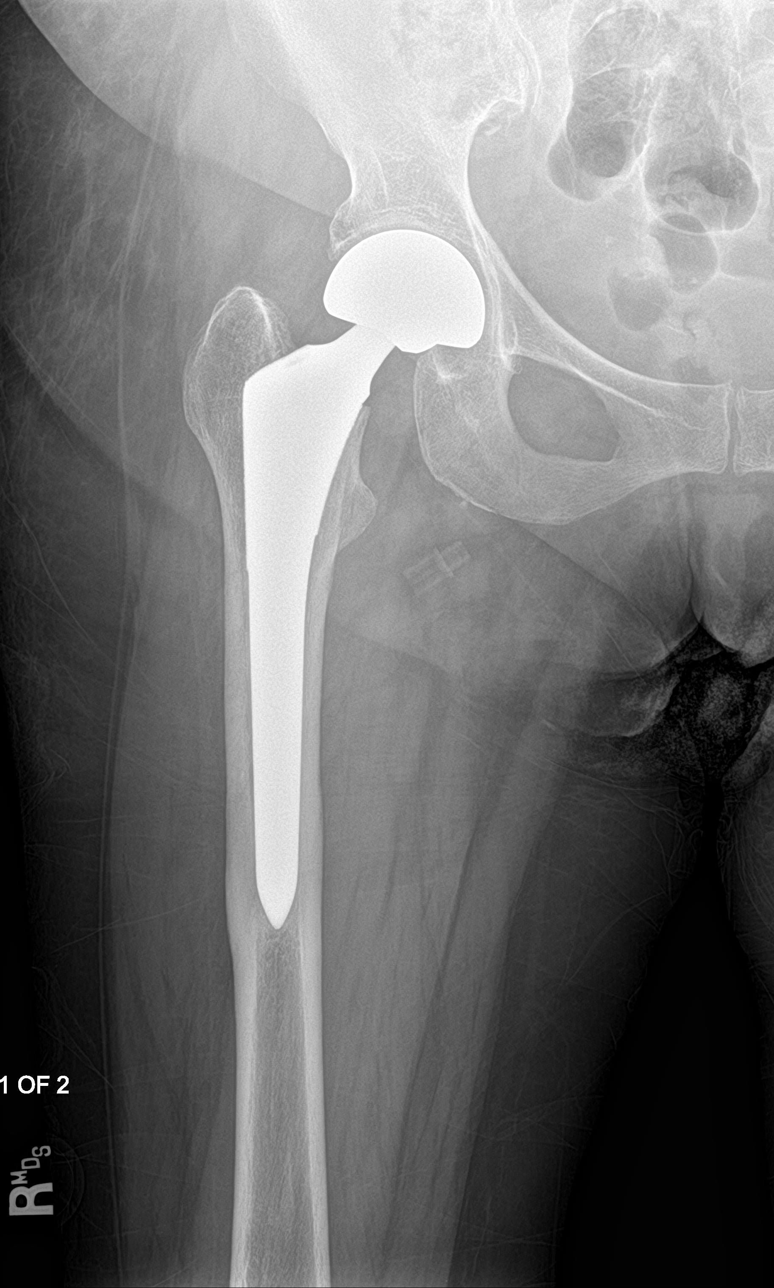

[femur ap (2 of 2)]
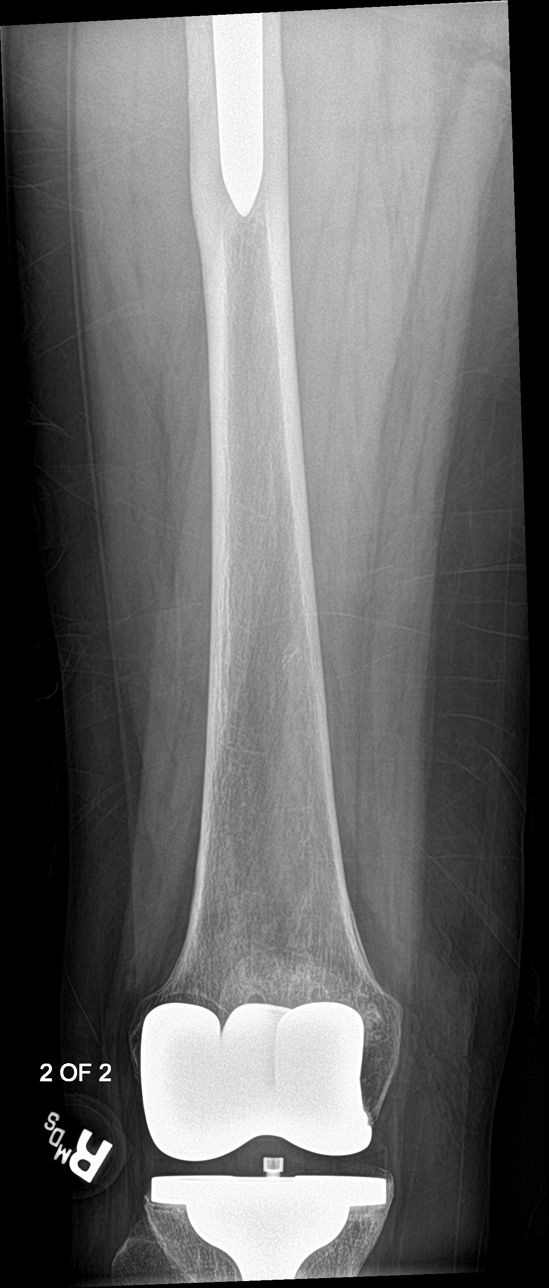

[femur lat (1 of 2)]
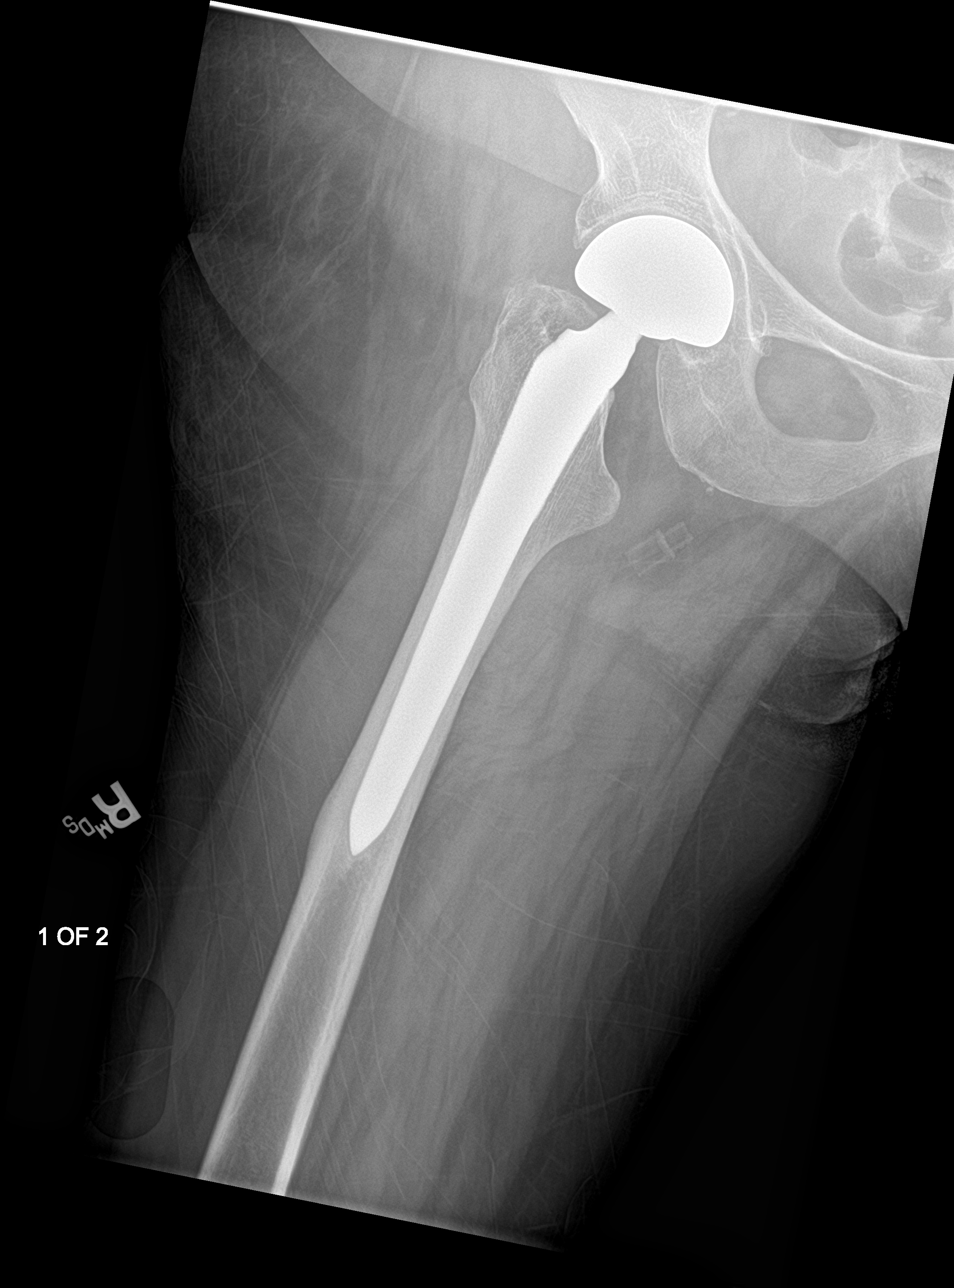

[femur lat (2 of 2)]
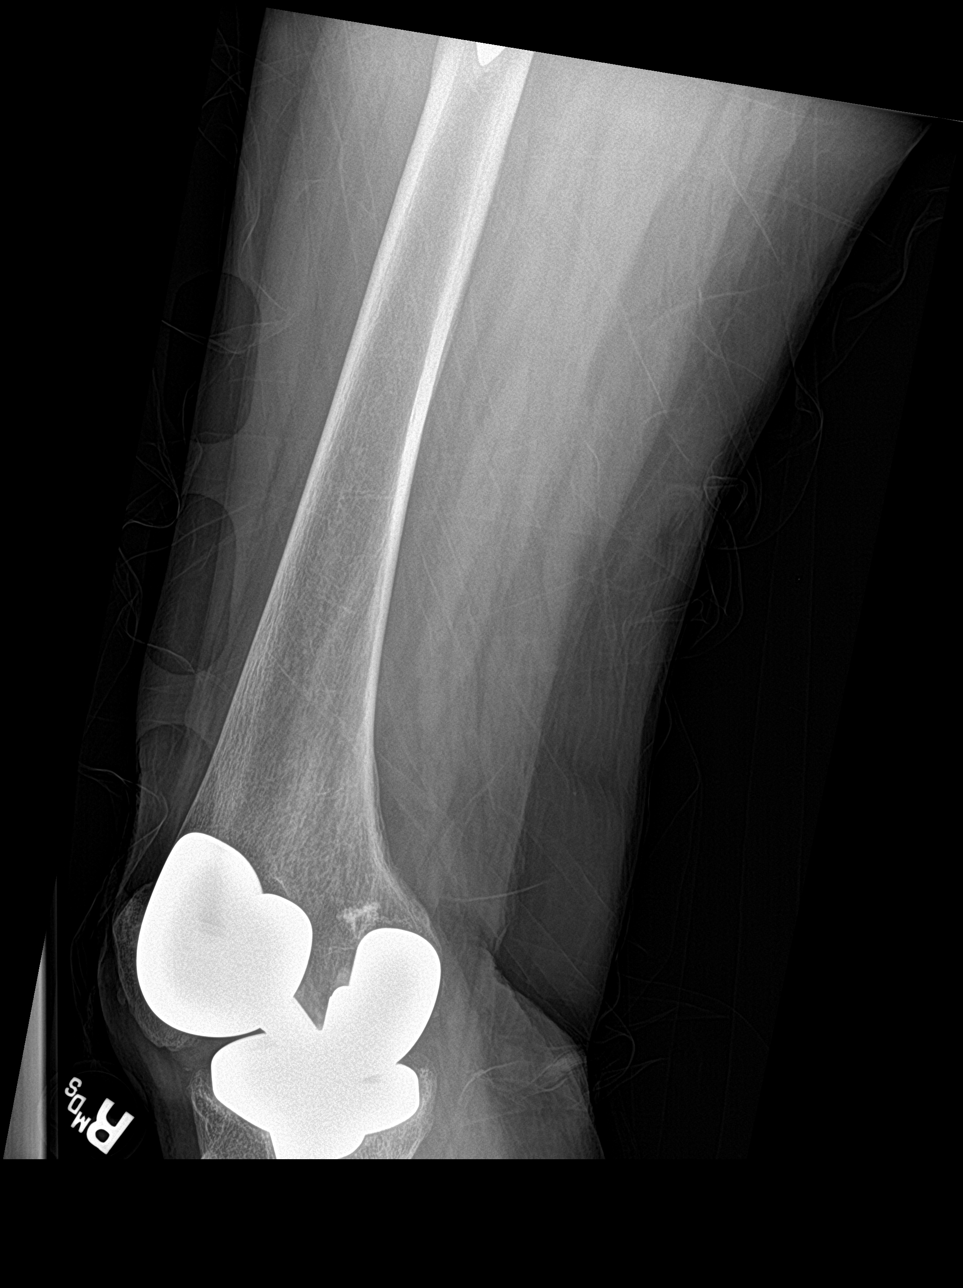

[4 of 4 positions shown; findings below may reference images not displayed]

FINDINGS: Status post right hemiarthroplasty. No fracture or malalignment.
Prior right knee replacement.
IMPRESSION: No acute osseous abnormality.

## 2020-02-20 IMAGING — CT CT L SPINE W/O CM
3 series · 9 of 33 positions shown, 11 images · non-contrast
Comparison: Lumbar spine MRI 08/04/2016

CLINICAL DATA: Low back pain. Fell down stairs.

EXAM:
CT LUMBAR SPINE WITHOUT CONTRAST
TECHNIQUE: Multidetector CT imaging of the lumbar spine was performed without
intravenous contrast administration. Multiplanar CT image
reconstructions were also generated.

[Series 5: l spine soft (person_name) · axial · 0.26mm/px · z∈[-754,-754]mm · 1 of 111 slices shown, 2 images]
[im 60/111  soft-tissue]
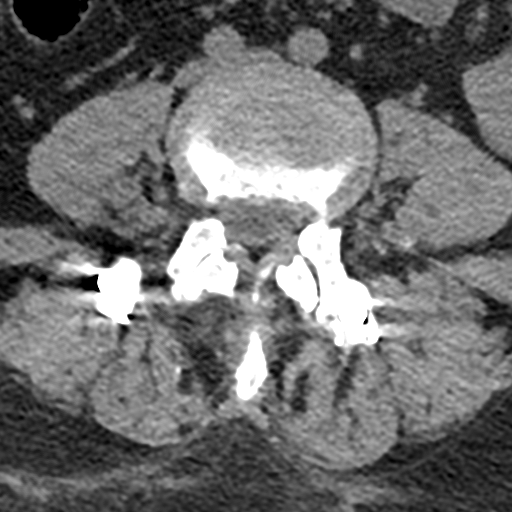
[im 60/111  bone]
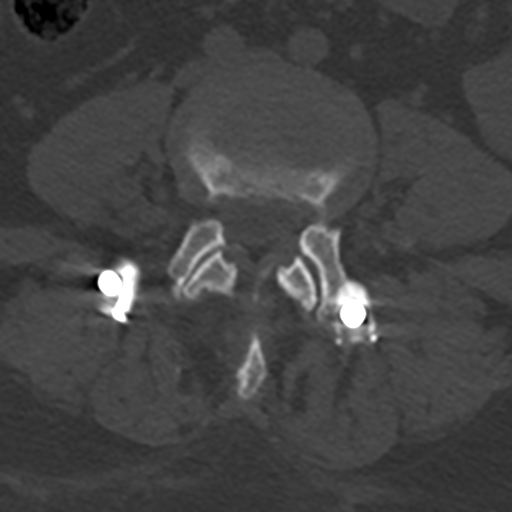

[Series 8: sagittal bone · sagittal · 0.23mm/px · 5 of 61 slices shown, 6 images]
[im 21/61  bone]
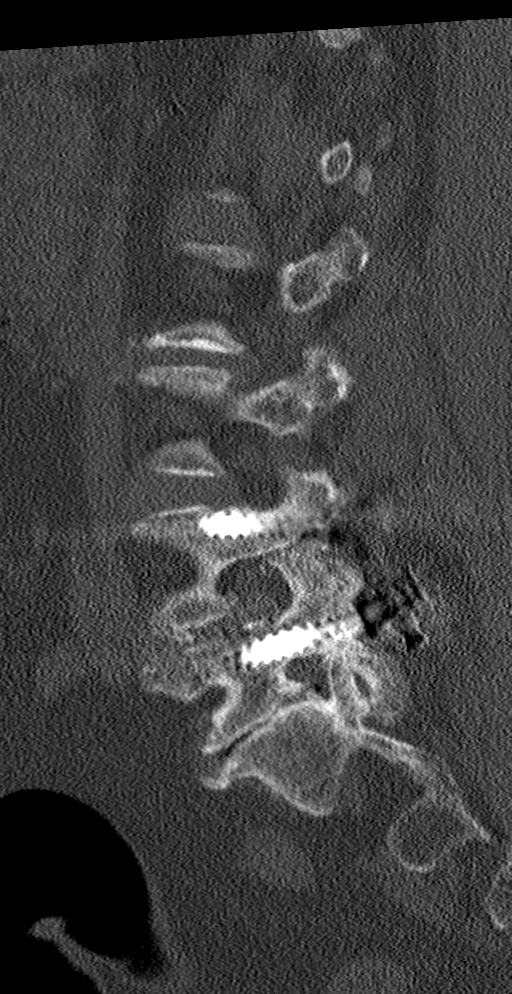
[im 26/61  bone]
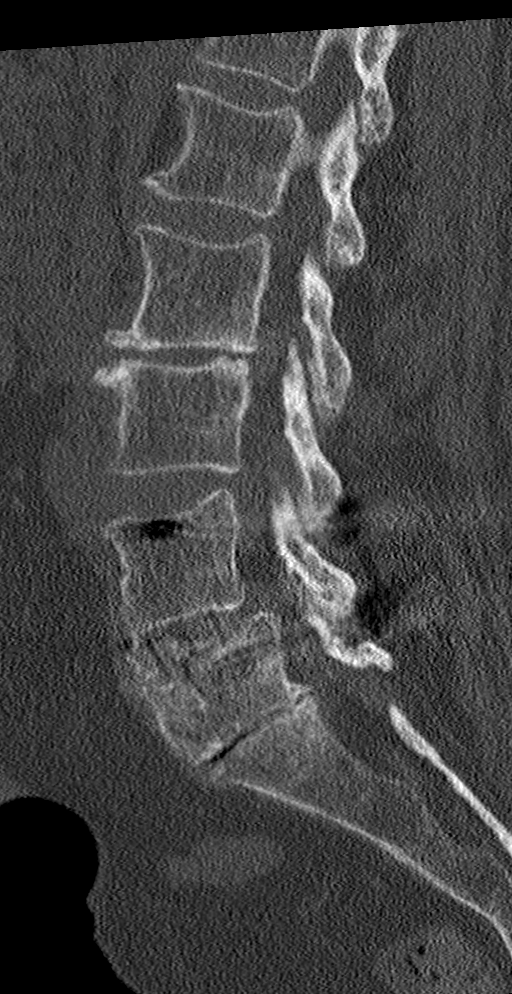
[im 31/61  soft-tissue]
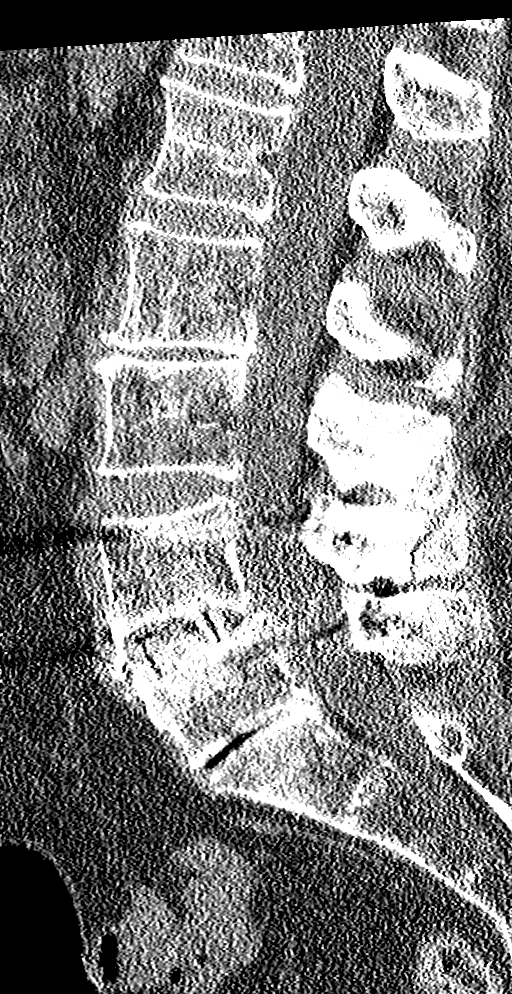
[im 31/61  bone]
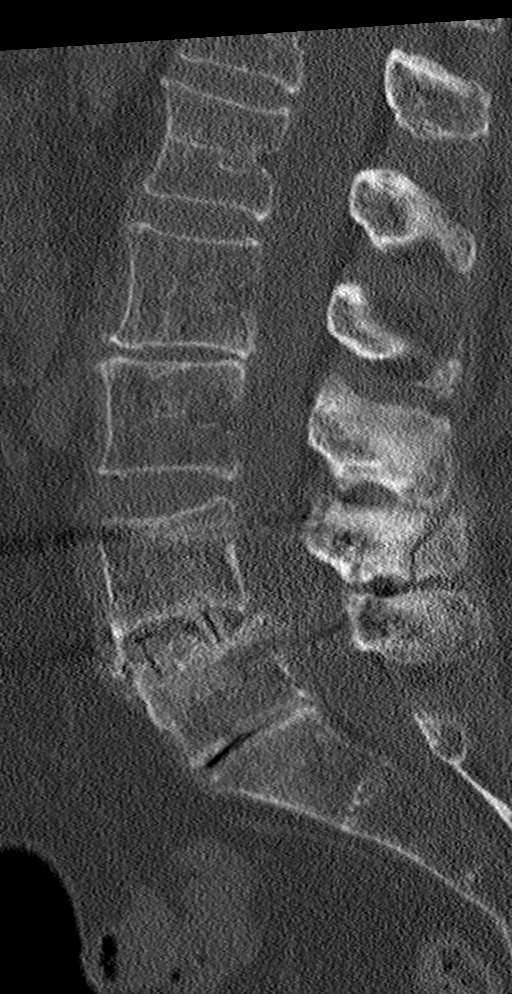
[im 36/61  bone]
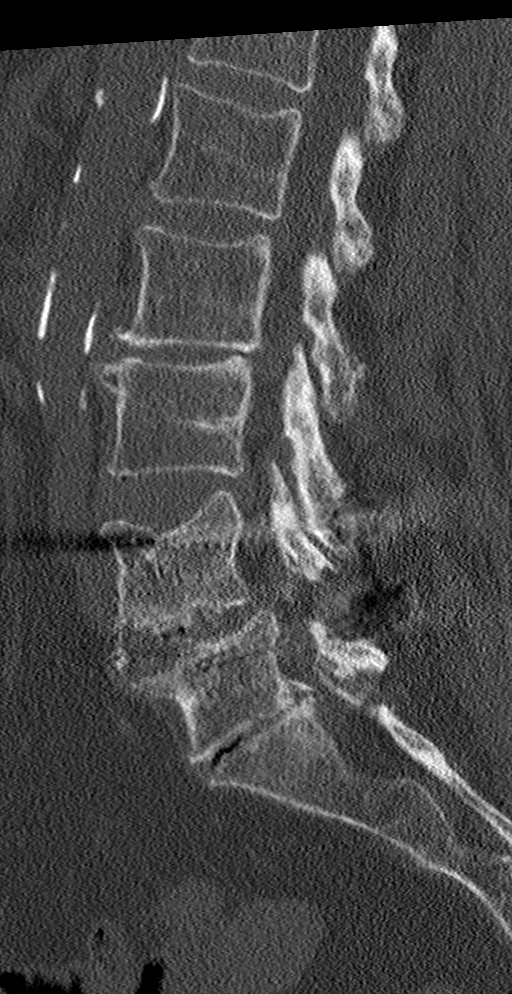
[im 41/61  bone]
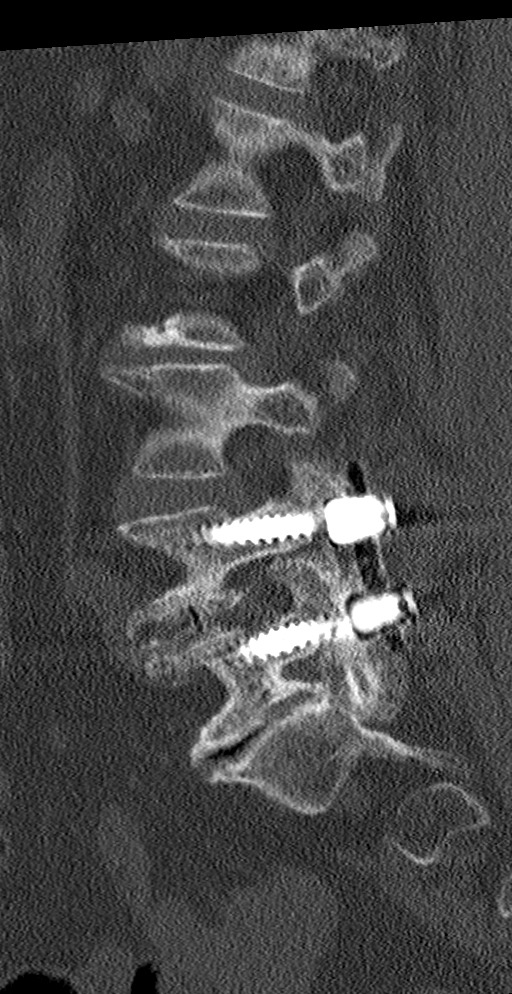

[Series 9: coronal bone · coronal · 0.23mm/px · 3 of 61 slices shown]
[im 13/61  bone]
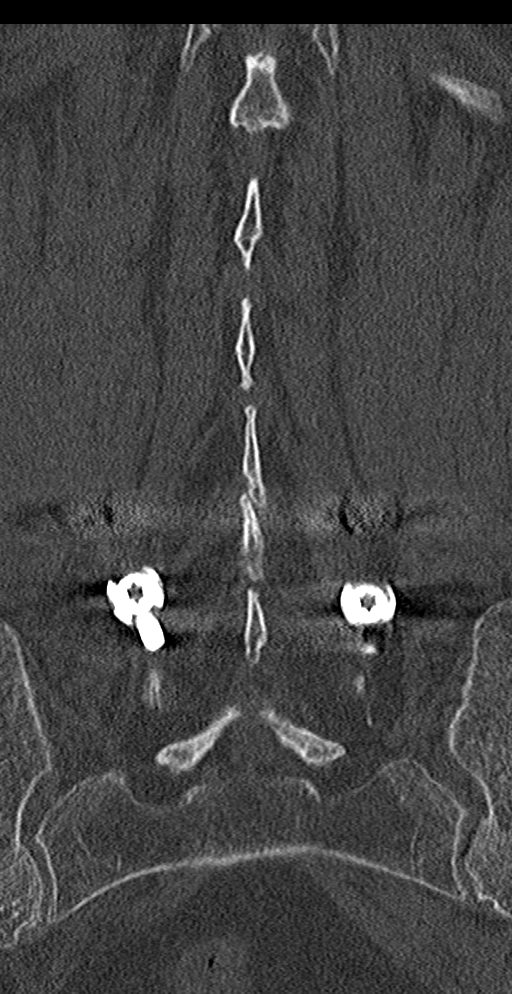
[im 25/61  bone]
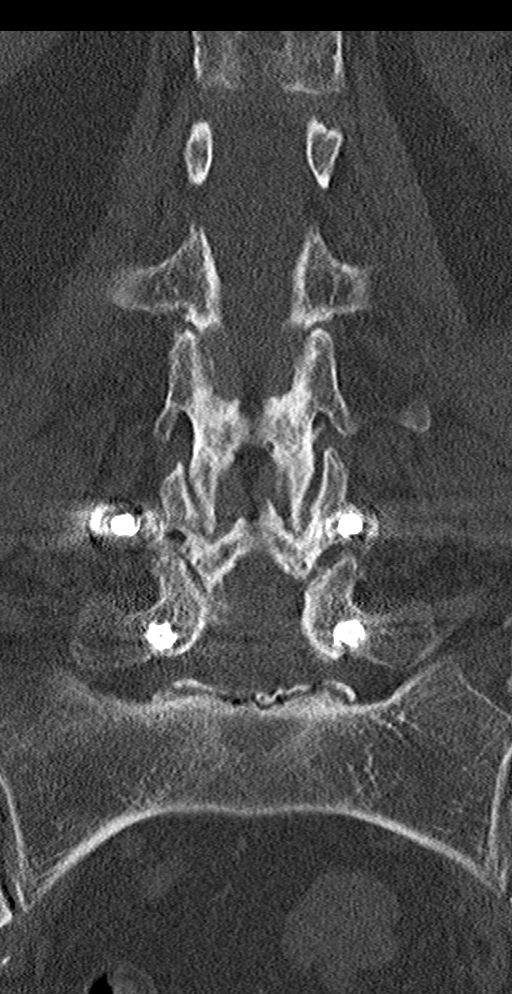
[im 37/61  bone]
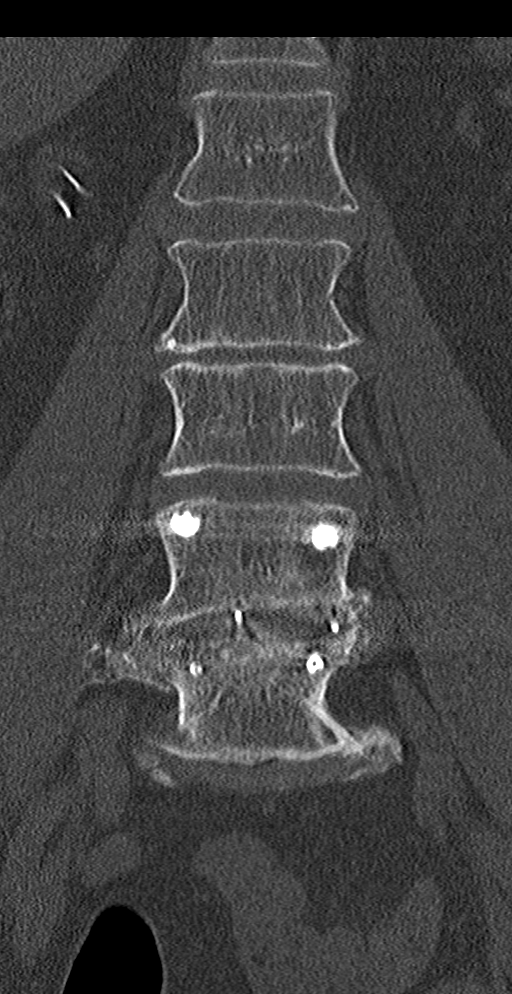

[9 of 33 positions shown; findings below may reference images not displayed]

FINDINGS: Segmentation: 5 lumbar type vertebrae.

Alignment: 3 mm anterolisthesis of L4 on L5, less than on the prior
MRI.

Vertebrae: Interval L4-5 posterior and interbody fusion with only a
small amount of bridging bone across the disc space although there
is solid posterior element arthrodesis. No evidence of pedicle screw
loosening. Mild L4 superior endplate compression fracture with 15%
height loss on the left, new from the prior MRI though not clearly
acute by CT. No suspicious osseous lesion.

Paraspinal and other soft tissues: Aortic atherosclerosis without
aneurysm. Status post cholecystectomy and right nephrectomy.

Disc levels:

T12-L1: Negative.

L1-2: Mild disc bulging greater to the left without evidence of
significant stenosis.

L2-3: Severe disc space narrowing, slightly progressed from the
prior MRI. Circumferential disc bulging and mild facet and
ligamentum flavum hypertrophy result in increased bilateral lateral
recess stenosis, mild-to-moderate spinal stenosis, and mild
bilateral neural foraminal stenosis. Potential bilateral L3 nerve
root impingement in the lateral recesses.

L3-4: Mild disc bulging and facet hypertrophy without evidence of
significant stenosis.

L4-5: Interval left laminectomy and fusion. Disc bulging, endplate
spurring, and facet hypertrophy result in mild-to-moderate bilateral
neural foraminal stenosis. No suspected significant residual spinal
stenosis.

L5-S1: Chronic severe disc space height loss, endplate spurring, and
facet hypertrophy result in mild right and moderate left neural
foraminal stenosis, similar to the prior MRI. No spinal stenosis.
IMPRESSION: 1. Mild L4 superior endplate compression fracture, new from 3299 but
of indeterminate acuity.
2. Interval L4-5 fusion as above.
3. Progressive L2-3 disc degeneration with increased bilateral
lateral recess stenosis and mild-to-moderate spinal stenosis.
4. Chronically advanced disc degeneration at L5-S1 with left greater
than right neural foraminal stenosis.
5.  Aortic Atherosclerosis ([id number]).

## 2020-02-20 NOTE — Progress Notes (Signed)
Patient had a follow-up right before the appointment time today at 2 PM.  Patient tripped over vacuum cleaner cord and fell as per granddaughter-Carrie.  Patient unable to participate in evaluation today.  Patient seen as pressing her forehead with her hands, patient clearly in pain, for her seen as swollen likely from the fall.  Patient as well as granddaughter advised to call primary care provider or go to the nearest urgent care.  Patient wants to reschedule this appointment since she is unable to participate.  Advised to call the front desk to reschedule.

## 2020-02-21 DIAGNOSIS — E1142 Type 2 diabetes mellitus with diabetic polyneuropathy: Secondary | ICD-10-CM | POA: Diagnosis not present

## 2020-02-21 DIAGNOSIS — J449 Chronic obstructive pulmonary disease, unspecified: Secondary | ICD-10-CM | POA: Diagnosis not present

## 2020-02-21 DIAGNOSIS — D631 Anemia in chronic kidney disease: Secondary | ICD-10-CM | POA: Diagnosis not present

## 2020-02-21 DIAGNOSIS — I129 Hypertensive chronic kidney disease with stage 1 through stage 4 chronic kidney disease, or unspecified chronic kidney disease: Secondary | ICD-10-CM | POA: Diagnosis not present

## 2020-02-21 DIAGNOSIS — I872 Venous insufficiency (chronic) (peripheral): Secondary | ICD-10-CM | POA: Diagnosis not present

## 2020-02-21 DIAGNOSIS — E1122 Type 2 diabetes mellitus with diabetic chronic kidney disease: Secondary | ICD-10-CM | POA: Diagnosis not present

## 2020-02-21 DIAGNOSIS — E1151 Type 2 diabetes mellitus with diabetic peripheral angiopathy without gangrene: Secondary | ICD-10-CM | POA: Diagnosis not present

## 2020-02-21 DIAGNOSIS — N1832 Chronic kidney disease, stage 3b: Secondary | ICD-10-CM | POA: Diagnosis not present

## 2020-02-21 DIAGNOSIS — S42122D Displaced fracture of acromial process, left shoulder, subsequent encounter for fracture with routine healing: Secondary | ICD-10-CM | POA: Diagnosis not present

## 2020-02-22 ENCOUNTER — Telehealth: Payer: Self-pay

## 2020-02-22 ENCOUNTER — Telehealth: Payer: Self-pay | Admitting: Psychiatry

## 2020-02-22 DIAGNOSIS — E1151 Type 2 diabetes mellitus with diabetic peripheral angiopathy without gangrene: Secondary | ICD-10-CM | POA: Diagnosis not present

## 2020-02-22 DIAGNOSIS — F3176 Bipolar disorder, in full remission, most recent episode depressed: Secondary | ICD-10-CM

## 2020-02-22 DIAGNOSIS — D631 Anemia in chronic kidney disease: Secondary | ICD-10-CM | POA: Diagnosis not present

## 2020-02-22 DIAGNOSIS — E1122 Type 2 diabetes mellitus with diabetic chronic kidney disease: Secondary | ICD-10-CM | POA: Diagnosis not present

## 2020-02-22 DIAGNOSIS — J449 Chronic obstructive pulmonary disease, unspecified: Secondary | ICD-10-CM | POA: Diagnosis not present

## 2020-02-22 DIAGNOSIS — I872 Venous insufficiency (chronic) (peripheral): Secondary | ICD-10-CM | POA: Diagnosis not present

## 2020-02-22 DIAGNOSIS — I129 Hypertensive chronic kidney disease with stage 1 through stage 4 chronic kidney disease, or unspecified chronic kidney disease: Secondary | ICD-10-CM | POA: Diagnosis not present

## 2020-02-22 DIAGNOSIS — E1142 Type 2 diabetes mellitus with diabetic polyneuropathy: Secondary | ICD-10-CM | POA: Diagnosis not present

## 2020-02-22 DIAGNOSIS — S42122D Displaced fracture of acromial process, left shoulder, subsequent encounter for fracture with routine healing: Secondary | ICD-10-CM | POA: Diagnosis not present

## 2020-02-22 DIAGNOSIS — F419 Anxiety disorder, unspecified: Secondary | ICD-10-CM

## 2020-02-22 DIAGNOSIS — N1832 Chronic kidney disease, stage 3b: Secondary | ICD-10-CM | POA: Diagnosis not present

## 2020-02-22 MED ORDER — LAMOTRIGINE 100 MG PO TABS: 100.0000 mg | ORAL_TABLET | Freq: Two times a day (BID) | ORAL | 0 refills | Status: DC

## 2020-02-22 NOTE — Telephone Encounter (Signed)
Patient was not evaluated by writer on 02/20/2020 since she had a fall . She was advised to reschedule. I will give her 30 days supply only. Please ask to schedule a new patient appointment - 45 minutes with Probation officer .

## 2020-02-22 NOTE — Telephone Encounter (Signed)
This is a new patient for Probation officer.  At the time of her appointment patient had a fall at her home and could not keep the appointment with writer.  We will provide a 30-day supply of her Lamictal and I have sent communication to Priscille Kluver to call patient to schedule an appointment with writer as a new patient.  Future refills will only be provided once patient seen.

## 2020-02-22 NOTE — Telephone Encounter (Signed)
received a call that a refill needs to sent in for lam[facility]al 100mg  [facility] [place]. Pt seen on 02-20-20    lamoTRIgine (LAMICTAL) 100 MG tablet Medication Date: 07/13/2019 Department: [facility] Ordering/Authorizing: [place] Crane, MD    Order Providers  Prescribing Provider Encounter Provider  [place] Crane, MD [place] Crane, MD   Outpatient Medication Detail   Disp Refills Start End   lamoTRIgine (LAMICTAL) 100 MG tablet 180 tablet 0 07/13/2019    Sig - Route: Take 1 tablet (100 mg total) by mouth 2 (two) times daily. - Oral   Sent to pharmacy as: lamoTRIgine (LAMICTAL) 100 MG tablet   E-Prescribing Status: Receipt confirmed by pharmacy (07/13/2019 11:25 AM EDT)    Associated Diagnoses  Bipolar disorder, in full remission, most recent episode depressed [facility]) - Primary     Anxiety      Pharmacy  [id number] - [place], [address]

## 2020-02-23 NOTE — Telephone Encounter (Signed)
That is me , the one who is writing this note .

## 2020-02-23 NOTE — Telephone Encounter (Signed)
i called and left a message for patient to call office to make an appt.

## 2020-02-23 NOTE — Telephone Encounter (Signed)
Who is Probation officer?

## 2020-02-23 NOTE — Telephone Encounter (Signed)
called left message for patient to call office to make an appt

## 2020-02-26 DIAGNOSIS — Z905 Acquired absence of kidney: Secondary | ICD-10-CM | POA: Diagnosis not present

## 2020-02-26 DIAGNOSIS — J449 Chronic obstructive pulmonary disease, unspecified: Secondary | ICD-10-CM | POA: Diagnosis not present

## 2020-02-26 DIAGNOSIS — N1832 Chronic kidney disease, stage 3b: Secondary | ICD-10-CM | POA: Diagnosis not present

## 2020-02-26 DIAGNOSIS — S42122D Displaced fracture of acromial process, left shoulder, subsequent encounter for fracture with routine healing: Secondary | ICD-10-CM | POA: Diagnosis not present

## 2020-02-26 DIAGNOSIS — E1151 Type 2 diabetes mellitus with diabetic peripheral angiopathy without gangrene: Secondary | ICD-10-CM | POA: Diagnosis not present

## 2020-02-26 DIAGNOSIS — E1122 Type 2 diabetes mellitus with diabetic chronic kidney disease: Secondary | ICD-10-CM | POA: Diagnosis not present

## 2020-02-26 DIAGNOSIS — D631 Anemia in chronic kidney disease: Secondary | ICD-10-CM | POA: Diagnosis not present

## 2020-02-26 DIAGNOSIS — E1142 Type 2 diabetes mellitus with diabetic polyneuropathy: Secondary | ICD-10-CM | POA: Diagnosis not present

## 2020-02-26 DIAGNOSIS — I872 Venous insufficiency (chronic) (peripheral): Secondary | ICD-10-CM | POA: Diagnosis not present

## 2020-02-26 DIAGNOSIS — I129 Hypertensive chronic kidney disease with stage 1 through stage 4 chronic kidney disease, or unspecified chronic kidney disease: Secondary | ICD-10-CM | POA: Diagnosis not present

## 2020-02-26 DIAGNOSIS — N2581 Secondary hyperparathyroidism of renal origin: Secondary | ICD-10-CM | POA: Diagnosis not present

## 2020-02-29 DIAGNOSIS — I129 Hypertensive chronic kidney disease with stage 1 through stage 4 chronic kidney disease, or unspecified chronic kidney disease: Secondary | ICD-10-CM | POA: Diagnosis not present

## 2020-02-29 DIAGNOSIS — E1142 Type 2 diabetes mellitus with diabetic polyneuropathy: Secondary | ICD-10-CM | POA: Diagnosis not present

## 2020-02-29 DIAGNOSIS — S42122D Displaced fracture of acromial process, left shoulder, subsequent encounter for fracture with routine healing: Secondary | ICD-10-CM | POA: Diagnosis not present

## 2020-02-29 DIAGNOSIS — I872 Venous insufficiency (chronic) (peripheral): Secondary | ICD-10-CM | POA: Diagnosis not present

## 2020-02-29 DIAGNOSIS — J449 Chronic obstructive pulmonary disease, unspecified: Secondary | ICD-10-CM | POA: Diagnosis not present

## 2020-02-29 DIAGNOSIS — D631 Anemia in chronic kidney disease: Secondary | ICD-10-CM | POA: Diagnosis not present

## 2020-02-29 DIAGNOSIS — E1151 Type 2 diabetes mellitus with diabetic peripheral angiopathy without gangrene: Secondary | ICD-10-CM | POA: Diagnosis not present

## 2020-02-29 DIAGNOSIS — E1122 Type 2 diabetes mellitus with diabetic chronic kidney disease: Secondary | ICD-10-CM | POA: Diagnosis not present

## 2020-02-29 DIAGNOSIS — N1832 Chronic kidney disease, stage 3b: Secondary | ICD-10-CM | POA: Diagnosis not present

## 2020-03-01 DIAGNOSIS — N1832 Chronic kidney disease, stage 3b: Secondary | ICD-10-CM | POA: Diagnosis not present

## 2020-03-01 DIAGNOSIS — S42122D Displaced fracture of acromial process, left shoulder, subsequent encounter for fracture with routine healing: Secondary | ICD-10-CM | POA: Diagnosis not present

## 2020-03-01 DIAGNOSIS — I129 Hypertensive chronic kidney disease with stage 1 through stage 4 chronic kidney disease, or unspecified chronic kidney disease: Secondary | ICD-10-CM | POA: Diagnosis not present

## 2020-03-01 DIAGNOSIS — E1122 Type 2 diabetes mellitus with diabetic chronic kidney disease: Secondary | ICD-10-CM | POA: Diagnosis not present

## 2020-03-01 DIAGNOSIS — E1142 Type 2 diabetes mellitus with diabetic polyneuropathy: Secondary | ICD-10-CM | POA: Diagnosis not present

## 2020-03-01 DIAGNOSIS — E1151 Type 2 diabetes mellitus with diabetic peripheral angiopathy without gangrene: Secondary | ICD-10-CM | POA: Diagnosis not present

## 2020-03-01 DIAGNOSIS — I872 Venous insufficiency (chronic) (peripheral): Secondary | ICD-10-CM | POA: Diagnosis not present

## 2020-03-01 DIAGNOSIS — D631 Anemia in chronic kidney disease: Secondary | ICD-10-CM | POA: Diagnosis not present

## 2020-03-01 DIAGNOSIS — J449 Chronic obstructive pulmonary disease, unspecified: Secondary | ICD-10-CM | POA: Diagnosis not present

## 2020-03-06 DIAGNOSIS — D631 Anemia in chronic kidney disease: Secondary | ICD-10-CM | POA: Diagnosis not present

## 2020-03-06 DIAGNOSIS — J449 Chronic obstructive pulmonary disease, unspecified: Secondary | ICD-10-CM | POA: Diagnosis not present

## 2020-03-06 DIAGNOSIS — I129 Hypertensive chronic kidney disease with stage 1 through stage 4 chronic kidney disease, or unspecified chronic kidney disease: Secondary | ICD-10-CM | POA: Diagnosis not present

## 2020-03-06 DIAGNOSIS — I872 Venous insufficiency (chronic) (peripheral): Secondary | ICD-10-CM | POA: Diagnosis not present

## 2020-03-06 DIAGNOSIS — N1832 Chronic kidney disease, stage 3b: Secondary | ICD-10-CM | POA: Diagnosis not present

## 2020-03-06 DIAGNOSIS — E1142 Type 2 diabetes mellitus with diabetic polyneuropathy: Secondary | ICD-10-CM | POA: Diagnosis not present

## 2020-03-06 DIAGNOSIS — E1151 Type 2 diabetes mellitus with diabetic peripheral angiopathy without gangrene: Secondary | ICD-10-CM | POA: Diagnosis not present

## 2020-03-06 DIAGNOSIS — E1122 Type 2 diabetes mellitus with diabetic chronic kidney disease: Secondary | ICD-10-CM | POA: Diagnosis not present

## 2020-03-06 DIAGNOSIS — S42122D Displaced fracture of acromial process, left shoulder, subsequent encounter for fracture with routine healing: Secondary | ICD-10-CM | POA: Diagnosis not present

## 2020-03-07 DIAGNOSIS — S42122D Displaced fracture of acromial process, left shoulder, subsequent encounter for fracture with routine healing: Secondary | ICD-10-CM | POA: Diagnosis not present

## 2020-03-07 DIAGNOSIS — I872 Venous insufficiency (chronic) (peripheral): Secondary | ICD-10-CM | POA: Diagnosis not present

## 2020-03-07 DIAGNOSIS — N1832 Chronic kidney disease, stage 3b: Secondary | ICD-10-CM | POA: Diagnosis not present

## 2020-03-07 DIAGNOSIS — D631 Anemia in chronic kidney disease: Secondary | ICD-10-CM | POA: Diagnosis not present

## 2020-03-07 DIAGNOSIS — I129 Hypertensive chronic kidney disease with stage 1 through stage 4 chronic kidney disease, or unspecified chronic kidney disease: Secondary | ICD-10-CM | POA: Diagnosis not present

## 2020-03-07 DIAGNOSIS — E1142 Type 2 diabetes mellitus with diabetic polyneuropathy: Secondary | ICD-10-CM | POA: Diagnosis not present

## 2020-03-07 DIAGNOSIS — J449 Chronic obstructive pulmonary disease, unspecified: Secondary | ICD-10-CM | POA: Diagnosis not present

## 2020-03-07 DIAGNOSIS — E1151 Type 2 diabetes mellitus with diabetic peripheral angiopathy without gangrene: Secondary | ICD-10-CM | POA: Diagnosis not present

## 2020-03-07 DIAGNOSIS — E1122 Type 2 diabetes mellitus with diabetic chronic kidney disease: Secondary | ICD-10-CM | POA: Diagnosis not present

## 2020-03-11 ENCOUNTER — Ambulatory Visit: Payer: Medicare PPO | Admitting: Podiatry

## 2020-03-13 DIAGNOSIS — D631 Anemia in chronic kidney disease: Secondary | ICD-10-CM | POA: Diagnosis not present

## 2020-03-13 DIAGNOSIS — E1142 Type 2 diabetes mellitus with diabetic polyneuropathy: Secondary | ICD-10-CM | POA: Diagnosis not present

## 2020-03-13 DIAGNOSIS — I129 Hypertensive chronic kidney disease with stage 1 through stage 4 chronic kidney disease, or unspecified chronic kidney disease: Secondary | ICD-10-CM | POA: Diagnosis not present

## 2020-03-13 DIAGNOSIS — E1151 Type 2 diabetes mellitus with diabetic peripheral angiopathy without gangrene: Secondary | ICD-10-CM | POA: Diagnosis not present

## 2020-03-13 DIAGNOSIS — I872 Venous insufficiency (chronic) (peripheral): Secondary | ICD-10-CM | POA: Diagnosis not present

## 2020-03-13 DIAGNOSIS — S42122D Displaced fracture of acromial process, left shoulder, subsequent encounter for fracture with routine healing: Secondary | ICD-10-CM | POA: Diagnosis not present

## 2020-03-13 DIAGNOSIS — J449 Chronic obstructive pulmonary disease, unspecified: Secondary | ICD-10-CM | POA: Diagnosis not present

## 2020-03-13 DIAGNOSIS — N1832 Chronic kidney disease, stage 3b: Secondary | ICD-10-CM | POA: Diagnosis not present

## 2020-03-13 DIAGNOSIS — E1122 Type 2 diabetes mellitus with diabetic chronic kidney disease: Secondary | ICD-10-CM | POA: Diagnosis not present

## 2020-03-14 DIAGNOSIS — I872 Venous insufficiency (chronic) (peripheral): Secondary | ICD-10-CM | POA: Diagnosis not present

## 2020-03-14 DIAGNOSIS — E1151 Type 2 diabetes mellitus with diabetic peripheral angiopathy without gangrene: Secondary | ICD-10-CM | POA: Diagnosis not present

## 2020-03-14 DIAGNOSIS — E1142 Type 2 diabetes mellitus with diabetic polyneuropathy: Secondary | ICD-10-CM | POA: Diagnosis not present

## 2020-03-14 DIAGNOSIS — E1122 Type 2 diabetes mellitus with diabetic chronic kidney disease: Secondary | ICD-10-CM | POA: Diagnosis not present

## 2020-03-14 DIAGNOSIS — D631 Anemia in chronic kidney disease: Secondary | ICD-10-CM | POA: Diagnosis not present

## 2020-03-14 DIAGNOSIS — I129 Hypertensive chronic kidney disease with stage 1 through stage 4 chronic kidney disease, or unspecified chronic kidney disease: Secondary | ICD-10-CM | POA: Diagnosis not present

## 2020-03-14 DIAGNOSIS — N1832 Chronic kidney disease, stage 3b: Secondary | ICD-10-CM | POA: Diagnosis not present

## 2020-03-14 DIAGNOSIS — J449 Chronic obstructive pulmonary disease, unspecified: Secondary | ICD-10-CM | POA: Diagnosis not present

## 2020-03-14 DIAGNOSIS — S42122D Displaced fracture of acromial process, left shoulder, subsequent encounter for fracture with routine healing: Secondary | ICD-10-CM | POA: Diagnosis not present

## 2020-03-18 DIAGNOSIS — S42122K Displaced fracture of acromial process, left shoulder, subsequent encounter for fracture with nonunion: Secondary | ICD-10-CM | POA: Diagnosis not present

## 2020-03-19 DIAGNOSIS — I872 Venous insufficiency (chronic) (peripheral): Secondary | ICD-10-CM | POA: Diagnosis not present

## 2020-03-19 DIAGNOSIS — D631 Anemia in chronic kidney disease: Secondary | ICD-10-CM | POA: Diagnosis not present

## 2020-03-19 DIAGNOSIS — E1142 Type 2 diabetes mellitus with diabetic polyneuropathy: Secondary | ICD-10-CM | POA: Diagnosis not present

## 2020-03-19 DIAGNOSIS — E1151 Type 2 diabetes mellitus with diabetic peripheral angiopathy without gangrene: Secondary | ICD-10-CM | POA: Diagnosis not present

## 2020-03-19 DIAGNOSIS — J449 Chronic obstructive pulmonary disease, unspecified: Secondary | ICD-10-CM | POA: Diagnosis not present

## 2020-03-19 DIAGNOSIS — E1122 Type 2 diabetes mellitus with diabetic chronic kidney disease: Secondary | ICD-10-CM | POA: Diagnosis not present

## 2020-03-19 DIAGNOSIS — N1832 Chronic kidney disease, stage 3b: Secondary | ICD-10-CM | POA: Diagnosis not present

## 2020-03-19 DIAGNOSIS — S42122D Displaced fracture of acromial process, left shoulder, subsequent encounter for fracture with routine healing: Secondary | ICD-10-CM | POA: Diagnosis not present

## 2020-03-19 DIAGNOSIS — I129 Hypertensive chronic kidney disease with stage 1 through stage 4 chronic kidney disease, or unspecified chronic kidney disease: Secondary | ICD-10-CM | POA: Diagnosis not present

## 2020-03-20 ENCOUNTER — Telehealth: Payer: Self-pay | Admitting: Pulmonary Disease

## 2020-03-20 DIAGNOSIS — J449 Chronic obstructive pulmonary disease, unspecified: Secondary | ICD-10-CM | POA: Diagnosis not present

## 2020-03-20 DIAGNOSIS — D631 Anemia in chronic kidney disease: Secondary | ICD-10-CM | POA: Diagnosis not present

## 2020-03-20 DIAGNOSIS — E1151 Type 2 diabetes mellitus with diabetic peripheral angiopathy without gangrene: Secondary | ICD-10-CM | POA: Diagnosis not present

## 2020-03-20 DIAGNOSIS — E1142 Type 2 diabetes mellitus with diabetic polyneuropathy: Secondary | ICD-10-CM | POA: Diagnosis not present

## 2020-03-20 DIAGNOSIS — I129 Hypertensive chronic kidney disease with stage 1 through stage 4 chronic kidney disease, or unspecified chronic kidney disease: Secondary | ICD-10-CM | POA: Diagnosis not present

## 2020-03-20 DIAGNOSIS — N1832 Chronic kidney disease, stage 3b: Secondary | ICD-10-CM | POA: Diagnosis not present

## 2020-03-20 DIAGNOSIS — I872 Venous insufficiency (chronic) (peripheral): Secondary | ICD-10-CM | POA: Diagnosis not present

## 2020-03-20 DIAGNOSIS — S42122D Displaced fracture of acromial process, left shoulder, subsequent encounter for fracture with routine healing: Secondary | ICD-10-CM | POA: Diagnosis not present

## 2020-03-20 DIAGNOSIS — E1122 Type 2 diabetes mellitus with diabetic chronic kidney disease: Secondary | ICD-10-CM | POA: Diagnosis not present

## 2020-03-20 NOTE — Telephone Encounter (Signed)
Called and spoke with patient who states that she is coughing more and then she is not. She says her lungs are struggling. States that it completely went away when she was on prednisone regimen but now it is back. States that it is causing her oxygen level to go down to about 89. She has been at 93%. States that it is mostly dry cough but sometimes productive cough with green/yellow.   Dr. Patsey Berthold please advise

## 2020-03-21 DIAGNOSIS — H353211 Exudative age-related macular degeneration, right eye, with active choroidal neovascularization: Secondary | ICD-10-CM | POA: Diagnosis not present

## 2020-03-22 MED ORDER — PREDNISONE 10 MG (21) PO TBPK: ORAL_TABLET | ORAL | 0 refills | Status: DC

## 2020-03-22 NOTE — Telephone Encounter (Signed)
We can send in a prednisone taper.  Make sure she is using her nebulizers and Pulmicort.

## 2020-03-22 NOTE — Telephone Encounter (Signed)
Patient is aware of below recommendations. She is taking both Pulmicort and albuterol nebulizer. Rx for prednisone has been sent to preferred pharmacy.   Nothing further needed at this time.

## 2020-03-24 ENCOUNTER — Emergency Department: Payer: Medicare PPO

## 2020-03-24 ENCOUNTER — Emergency Department
Admission: EM | Admit: 2020-03-24 | Discharge: 2020-03-24 | Disposition: A | Discharge status: Home / Self Care | Payer: Medicare PPO | Attending: Emergency Medicine | Admitting: Emergency Medicine

## 2020-03-24 DIAGNOSIS — F444 Conversion disorder with motor symptom or deficit: Secondary | ICD-10-CM

## 2020-03-24 DIAGNOSIS — I129 Hypertensive chronic kidney disease with stage 1 through stage 4 chronic kidney disease, or unspecified chronic kidney disease: Secondary | ICD-10-CM | POA: Insufficient documentation

## 2020-03-24 DIAGNOSIS — R21 Rash and other nonspecific skin eruption: Secondary | ICD-10-CM | POA: Diagnosis not present

## 2020-03-24 DIAGNOSIS — R4781 Slurred speech: Secondary | ICD-10-CM | POA: Insufficient documentation

## 2020-03-24 DIAGNOSIS — I517 Cardiomegaly: Secondary | ICD-10-CM | POA: Diagnosis not present

## 2020-03-24 DIAGNOSIS — R479 Unspecified speech disturbances: Secondary | ICD-10-CM | POA: Diagnosis present

## 2020-03-24 DIAGNOSIS — G319 Degenerative disease of nervous system, unspecified: Secondary | ICD-10-CM | POA: Diagnosis not present

## 2020-03-24 DIAGNOSIS — R9082 White matter disease, unspecified: Secondary | ICD-10-CM | POA: Diagnosis not present

## 2020-03-24 DIAGNOSIS — Z7901 Long term (current) use of anticoagulants: Secondary | ICD-10-CM | POA: Diagnosis not present

## 2020-03-24 DIAGNOSIS — Z87891 Personal history of nicotine dependence: Secondary | ICD-10-CM | POA: Insufficient documentation

## 2020-03-24 DIAGNOSIS — N1832 Chronic kidney disease, stage 3b: Secondary | ICD-10-CM | POA: Insufficient documentation

## 2020-03-24 DIAGNOSIS — I2510 Atherosclerotic heart disease of native coronary artery without angina pectoris: Secondary | ICD-10-CM | POA: Diagnosis not present

## 2020-03-24 DIAGNOSIS — F449 Dissociative and conversion disorder, unspecified: Secondary | ICD-10-CM | POA: Insufficient documentation

## 2020-03-24 DIAGNOSIS — R0602 Shortness of breath: Secondary | ICD-10-CM | POA: Diagnosis not present

## 2020-03-24 DIAGNOSIS — J320 Chronic maxillary sinusitis: Secondary | ICD-10-CM | POA: Diagnosis not present

## 2020-03-24 LAB — DIFFERENTIAL
Abs Immature Granulocytes: 0.16 10*3/uL — ABNORMAL HIGH (ref 0.00–0.07)
Basophils Absolute: 0.1 10*3/uL (ref 0.0–0.1)
Basophils Relative: 1 %
Eosinophils Absolute: 0 10*3/uL (ref 0.0–0.5)
Eosinophils Relative: 0 %
Immature Granulocytes: 2 %
Lymphocytes Relative: 16 %
Lymphs Abs: 1.6 10*3/uL (ref 0.7–4.0)
Monocytes Absolute: 0.1 10*3/uL (ref 0.1–1.0)
Monocytes Relative: 1 %
Neutro Abs: 8 10*3/uL — ABNORMAL HIGH (ref 1.7–7.7)
Neutrophils Relative %: 80 %

## 2020-03-24 LAB — COMPREHENSIVE METABOLIC PANEL
ALT: 16 U/L (ref 0–44)
AST: 25 U/L (ref 15–41)
Albumin: 3.7 g/dL (ref 3.5–5.0)
Alkaline Phosphatase: 61 U/L (ref 38–126)
Anion gap: 12 (ref 5–15)
BUN: 22 mg/dL (ref 8–23)
CO2: 23 mmol/L (ref 22–32)
Calcium: 8.5 mg/dL — ABNORMAL LOW (ref 8.9–10.3)
Chloride: 103 mmol/L (ref 98–111)
Creatinine, Ser: 1.52 mg/dL — ABNORMAL HIGH (ref 0.44–1.00)
GFR, Estimated: 35 mL/min — ABNORMAL LOW (ref >60)
Glucose, Bld: 170 mg/dL — ABNORMAL HIGH (ref 70–99)
Potassium: 4.6 mmol/L (ref 3.5–5.1)
Sodium: 138 mmol/L (ref 135–145)
Total Bilirubin: 0.8 mg/dL (ref 0.3–1.2)
Total Protein: 7.3 g/dL (ref 6.5–8.1)

## 2020-03-24 LAB — PROTIME-INR
INR: 1 (ref 0.8–1.2)
Prothrombin Time: 12.5 seconds (ref 11.4–15.2)

## 2020-03-24 LAB — URINALYSIS, COMPLETE (UACMP) WITH MICROSCOPIC
Bacteria, UA: NONE SEEN
Bilirubin Urine: NEGATIVE
Glucose, UA: NEGATIVE mg/dL
Hgb urine dipstick: NEGATIVE
Ketones, ur: NEGATIVE mg/dL
Leukocytes,Ua: NEGATIVE
Nitrite: NEGATIVE
Protein, ur: NEGATIVE mg/dL
Specific Gravity, Urine: 1.005 (ref 1.005–1.030)
WBC, UA: NONE SEEN WBC/hpf (ref 0–5)
pH: 7 (ref 5.0–8.0)

## 2020-03-24 LAB — CBC
HCT: 39.4 % (ref 36.0–46.0)
Hemoglobin: 12.5 g/dL (ref 12.0–15.0)
MCH: 28.9 pg (ref 26.0–34.0)
MCHC: 31.7 g/dL (ref 30.0–36.0)
MCV: 91 fL (ref 80.0–100.0)
Platelets: 219 10*3/uL (ref 150–400)
RBC: 4.33 MIL/uL (ref 3.87–5.11)
RDW: 14.3 % (ref 11.5–15.5)
WBC: 9.9 10*3/uL (ref 4.0–10.5)
nRBC: 0 % (ref 0.0–0.2)

## 2020-03-24 LAB — APTT: aPTT: 28 seconds (ref 24–36)

## 2020-03-24 LAB — CBG MONITORING, ED: Glucose-Capillary: 159 mg/dL — ABNORMAL HIGH (ref 70–99)

## 2020-03-24 LAB — TROPONIN I (HIGH SENSITIVITY): Troponin I (High Sensitivity): 39 ng/L — ABNORMAL HIGH (ref <18)

## 2020-03-24 MED ORDER — LORAZEPAM 2 MG/ML IJ SOLN
1.0000 mg | INTRAMUSCULAR | Status: DC | PRN
  Administered 2020-03-24: 1 mg via INTRAVENOUS
  Filled 2020-03-24: qty 1

## 2020-03-24 MED ORDER — SODIUM CHLORIDE 0.9% FLUSH
3.0000 mL | Freq: Once | INTRAVENOUS | Status: DC
Start: 2020-03-24 — End: 2020-03-25

## 2020-03-24 NOTE — ED Notes (Signed)
Patient transported to MRI 

## 2020-03-24 NOTE — Progress Notes (Signed)
   03/24/20 1533  Clinical Encounter Type  Visited With Patient  Visit Type Initial  Referral From Nurse  Consult/Referral To Chaplain  Spiritual Encounters  Spiritual Needs Emotional  Chaplain Autumn Patty responded to a Code Stroke, Pt [employer]. Medical team transported Pt to get a MRI. Ministry of presence and prayer was rendered, no family members were present at the time.

## 2020-03-24 NOTE — Progress Notes (Signed)
CODE STROKE- PHARMACY COMMUNICATION   Time CODE STROKE called/page received: 1536  Time response to CODE STROKE was made (in person or via phone): 1540  Time Stroke Kit retrieved from [facility] (only if needed): no tPA   Past Medical History:  Diagnosis Date  . Anxiety   . Asthma   . Bipolar disorder ([place])   . C. difficile diarrhea    from Abx 2010 hospitalized   . Cataract    s/p b/l repair   . Chicken pox   . CKD (chronic kidney disease)   . CKD (chronic kidney disease), stage III ([place])    a. s/p R nephrectomy./ aneurysm  . Conversion disorder   . COPD (chronic obstructive pulmonary disease) ([place])   . Depression   . Essential hypertension   . GERD (gastroesophageal reflux disease)   . Hyperlipidemia   . ILD (interstitial lung disease) ([place])    mild; 2/2 RA diagnosis  . Inflammatory arthritis    a. hands/carpal tunnel.  b. Low titer rheumatoid factor. c. Negative anti-CCP antibodies. d. Plaquenil.  . Nocturnal hypoxemia   . Non-Obstructive CAD    a. 07/2009 Cath ([facility]): nonobs dzs;  b. 03/2011 Cath [facility]): nonobs dzs.  . Osteoarthritis    a. Knees.  Marland Kitchen PAD (peripheral artery disease) ([place])   . PUD (peptic ulcer disease)   . S/P right hip fracture    11/01/16 s/p repair  . Shoulder pain   . Sleep apnea    no cpap / minimal  . Spinal stenosis at L4-L5 level    severe with L4/L5 anterolisthesis grade 1 anterolisthesis   . Toxic maculopathy   . Valvular heart disease    a. 07/2015 Echo: EF 55-60%, Mild AI, AS, MR, and TR.   Prior to Admission medications   Medication Sig Start Date End Date Taking? Authorizing Provider  Adalimumab 40 MG/0.4ML PNKT Inject 40 mg into the skin every 14 (fourteen) days.    Yes [provider]  albuterol (VENTOLIN HFA) 108 (90 Base) MCG/ACT inhaler Inhale 2 puffs into the lungs every 6 (six) hours as needed. Patient taking differently: Inhale 2 puffs into the lungs every 6 (six) hours as needed for wheezing or shortness of breath.   11/25/15   Juanito Doom, MD  ALPRAZolam Duanne Moron) 0.25 MG tablet Take 1 tablet (0.25 mg total) by mouth at bedtime as needed for anxiety. 02/13/20   McLean-Scocuzza, Nino Glow, MD  amLODipine (NORVASC) 2.5 MG tablet Take 2.5 mg by mouth daily.    [provider]  benzonatate (TESSALON) 200 MG capsule Take 1 capsule (200 mg total) by mouth 3 (three) times daily as needed for cough. Patient not taking: No sig reported 09/22/19   [name], MD  Biotin 10000 MCG TABS Take 10,000 mcg by mouth daily.    [provider]  budesonide (PULMICORT) 0.25 MG/2ML nebulizer solution USE 1 VIAL  IN  NEBULIZER TWICE  DAILY - rinse mouth after treatment Patient taking differently: Take 0.25 mg by nebulization daily.  12/01/19   [name], MD  Cholecalciferol 50 MCG (2000 UT) CAPS Take 4,000 Units by mouth daily.  08/25/19 08/24/20  [provider]  dicyclomine (BENTYL) 10 MG capsule TAKE 1 CAPSULE (10 MG TOTAL) BY MOUTH 4 (FOUR) TIMES DAILY - BEFORE MEALS AND AT BEDTIME. Patient taking differently: Take 20 mg by mouth in the morning and at bedtime.  01/17/19 01/12/20  Jonathon Bellows, MD  enoxaparin (LOVENOX) 40 MG/0.4ML injection Inject 0.4 mLs (  40 mg total) into the skin daily. 12/22/19   Lattie Corns, PA-C  escitalopram (LEXAPRO) 10 MG tablet Take 1 tablet (10 mg total) by mouth daily. 07/13/19   N[place] Crane, MD  famotidine (PEPCID) 20 MG tablet Take 1 tablet (20 mg total) by mouth daily. Before breakfast or dinner 01/19/19   McLean-Scocuzza, Nino Glow, MD  fluticasone (FLONASE) 50 MCG/ACT nasal spray Place 2 sprays into both nostrils daily. In am 12/08/19   McLean-Scocuzza, Nino Glow, MD  gabapentin (NEURONTIN) 300 MG capsule Take 2 capsules (600 mg total) by mouth in the morning and at bedtime. 12/08/19   McLean-Scocuzza, Nino Glow, MD  HYDROcodone-acetaminophen (NORCO/VICODIN) 5-325 MG tablet Take 1-2 tablets by mouth every 4 (four) hours as needed for moderate pain (pain  score 4-6). 12/22/19   Lattie Corns, PA-C  ipratropium-albuterol (DUONEB) 0.5-2.5 (3) MG/3ML SOLN Take 3 mLs by nebulization 3 (three) times daily as needed. Patient taking differently: Take 3 mLs by nebulization 3 (three) times daily as needed (shortness of breath).  07/07/19   [name], MD  lamoTRIgine (LAMICTAL) 100 MG tablet Take 1 tablet (100 mg total) by mouth 2 (two) times daily. 02/22/20   Ursula Alert, MD  leflunomide (ARAVA) 20 MG tablet Take 1 tablet (20 mg total) by mouth daily. 09/30/17   McLean-Scocuzza, Nino Glow, MD  lovastatin (MEVACOR) 20 MG tablet Take 1 tablet (20 mg total) by mouth at bedtime. 08/28/19   McLean-Scocuzza, Nino Glow, MD  montelukast (SINGULAIR) 10 MG tablet Take 1 tablet (10 mg total) by mouth daily. 07/20/19   McLean-Scocuzza, Nino Glow, MD  multivitamin-lutein [facility]) CAPS capsule Take 1 capsule by mouth daily.     [provider]  MYRBETRIQ 50 MG TB24 tablet TAKE 1 TABLET BY MOUTH EVERYDAY AT BEDTIME 02/20/20   Hollice Espy, MD  nebivolol (BYSTOLIC) 10 MG tablet Take 0.5 tablets (5 mg total) by mouth in the morning and at bedtime. 12/08/19   McLean-Scocuzza, Nino Glow, MD  pantoprazole (PROTONIX) 40 MG tablet Take 1 tablet (40 mg total) by mouth 2 (two) times daily. 30 minutes before food. Note reduction in frequency 03/07/19   McLean-Scocuzza, Nino Glow, MD  predniSONE (DELTASONE) 5 MG tablet Take 1.5 tablets (7.5 mg total) by mouth daily with breakfast. 12/19/19   [name], MD  predniSONE (STERAPRED UNI-PAK 21 TAB) 10 MG (21) TBPK tablet Use as directed. 03/22/20   [name], MD  QUEtiapine (SEROQUEL) 25 MG tablet Take 1 tablet (25 mg total) by mouth at bedtime. 07/13/19   [place] Crane, MD  sucralfate (CARAFATE) 1 g tablet Take 1 tablet (1 g total) by mouth 2 (two) times daily. 12/08/19 12/02/20  McLean-Scocuzza, Nino Glow, MD  traMADol (ULTRAM) 50 MG tablet Take 1 tablet (50 mg total) by mouth every 6 (six) hours as needed  for moderate pain. 12/22/19   Lattie Corns, PA-C  diphenhydrAMINE HCl, Sleep, (SLEEP AID) 50 MG CAPS Take by mouth.  03/24/20  [provider]    Sherilyn Banker, PharmD Pharmacy Resident  03/24/2020 4:30 PM

## 2020-03-24 NOTE — ED Notes (Signed)
Patient now awake and alert, talking normally, ready to go home.

## 2020-03-24 NOTE — ED Notes (Signed)
Back from CT

## 2020-03-24 NOTE — ED Notes (Signed)
Patient very sleepy at this time, speech mildly slurred, patient reports she feels too weak to walk to restroom, patient talking and following commands but does not open eyes,  in and out cath to be performed for specimen.

## 2020-03-24 NOTE — Consult Note (Signed)
NEUROLOGY CONSULTATION NOTE   Date of service: March 24, 2020 Patient Name: Kerri Carter MRN:  [id number] DOB:  12/20/1940 Reason for consult: "Stroke code" _ _ _   _ __   _ __ _ _  __ __   _ __   __ _  History of Present Illness  Kerri Carter is a 80 y.o. female with PMH significant for anxiety, hx of conversion disorder, Bipolar, CKD stage 3, iodine contrast allergy, GERD, PAD, CAD, spinal stenosis L4-5 s/p surgery, macular degeneration, valvular heart disease who was brought in as a stroke code for not being herself, speech issues and concern for a potential facial droop.  Grand daughter at bedside reports that they left her with neibhbor at 0930 and went to church. Then got a call that she is not doing good at 1415. They saw her at 1430 and she did not seem right and brought her to the ED. On my evaluation, patient is awake,alert, talking. When attempting stroke scale, patient has stuttering speech that is persistent when reading the stroke cards but when engaging in a conversation, tends to improve. She also is intermittently crying when asked if she is stressed. Endorses that she is stressed thinking about her husband who passed away from COPD.  NIHSS components Score: Comment  1a Level of Conscious 0[x]  1[]  2[]  3[]      1b LOC Questions 0[x]  1[]  2[]       1c LOC Commands 0[x]  1[]  2[]       2 Best Gaze 0[x]  1[]  2[]       3 Visual 0[x]  1[]  2[]  3[]      4 Facial Palsy 0[x]  1[]  2[]  3[]      5a Motor Arm - left 0[x]  1[]  2[]  3[]  4[]  UN[]    5b Motor Arm - Right 0[x]  1[]  2[]  3[]  4[]  UN[]    6a Motor Leg - Left 0[x]  1[]  2[]  3[]  4[]  UN[]    6b Motor Leg - Right 0[x]  1[]  2[]  3[]  4[]  UN[]    7 Limb Ataxia 0[x]  1[]  2[]  3[]  UN[]     8 Sensory 0[x]  1[]  2[]  UN[]      9 Best Language 0[x]  1[]  2[]  3[]    Stuttering speech but no aphasia  10 Dysarthria 0[x]  1[]  2[]  UN[]      11 Extinct. and Inattention 0[x]  1[]  2[]       TOTAL: 0    MRS: 3 TPA: no, outside the window and low concern for  stroke. Thrombectomy: No, low concern for stroke.   ROS   Unable to do a detailed ROS given the acuity of the situation but no obvious pain.  Past History   Past Medical History:  Diagnosis Date  . Anxiety   . Asthma   . Bipolar disorder ([place])   . C. difficile diarrhea    from Abx 2010 hospitalized   . Cataract    s/p b/l repair   . Chicken pox   . CKD (chronic kidney disease)   . CKD (chronic kidney disease), stage III ([place])    a. s/p R nephrectomy./ aneurysm  . Conversion disorder   . COPD (chronic obstructive pulmonary disease) ([place])   . Depression   . Essential hypertension   . GERD (gastroesophageal reflux disease)   . Hyperlipidemia   . ILD (interstitial lung disease) ([place])    mild; 2/2 RA diagnosis  . Inflammatory arthritis    a. hands/carpal tunnel.  b. Low titer rheumatoid factor. c. Negative anti-CCP antibodies. d. Plaquenil.  . Nocturnal hypoxemia   .  Non-Obstructive CAD    a. 07/2009 Cath ([facility]): nonobs dzs;  b. 03/2011 Cath [facility]): nonobs dzs.  . Osteoarthritis    a. Knees.  Marland Kitchen PAD (peripheral artery disease) ([place])   . PUD (peptic ulcer disease)   . S/P right hip fracture    11/01/16 s/p repair  . Shoulder pain   . Sleep apnea    no cpap / minimal  . Spinal stenosis at L4-L5 level    severe with L4/L5 anterolisthesis grade 1 anterolisthesis   . Toxic maculopathy   . Valvular heart disease    a. 07/2015 Echo: EF 55-60%, Mild AI, AS, MR, and TR.   Past Surgical History:  Procedure Laterality Date  . APPENDECTOMY    . BACK SURGERY     lumbar fusion  . BUNIONECTOMY Right   . CATARACT EXTRACTION, BILATERAL    . CESAREAN SECTION     x1  . CHOLECYSTECTOMY N/A 05/11/2016   Procedure: LAPAROSCOPIC CHOLECYSTECTOMY;  Surgeon: Florene Glen, MD;  Location: [facility] ORS;  Service: General;  Laterality: N/A;  . COLONOSCOPY WITH PROPOFOL N/A 04/02/2016   Procedure: COLONOSCOPY WITH PROPOFOL;  Surgeon: Jonathon Bellows, MD;  Location: [facility] ENDOSCOPY;  Service: Endoscopy;   Laterality: N/A;  . ENDOSCOPIC RETROGRADE CHOLANGIOPANCREATOGRAPHY (ERCP) WITH PROPOFOL N/A 05/08/2016   Procedure: ENDOSCOPIC RETROGRADE CHOLANGIOPANCREATOGRAPHY (ERCP) WITH PROPOFOL;  Surgeon: Lucilla Lame, MD;  Location: [facility] ENDOSCOPY;  Service: Endoscopy;  Laterality: N/A;  . ERCP     with biliary spincterotomy 05/08/16 Dr. Allen Norris for choledocholithiasis   . ESOPHAGEAL DILATION  04/02/2016   Procedure: ESOPHAGEAL DILATION;  Surgeon: Jonathon Bellows, MD;  Location: [facility] ENDOSCOPY;  Service: Endoscopy;;  . ESOPHAGOGASTRODUODENOSCOPY (EGD) WITH PROPOFOL N/A 04/02/2016   Procedure: ESOPHAGOGASTRODUODENOSCOPY (EGD) WITH PROPOFOL;  Surgeon: Jonathon Bellows, MD;  Location: [facility] ENDOSCOPY;  Service: Endoscopy;  Laterality: N/A;  . HIP ARTHROPLASTY Right 11/01/2016   Procedure: ARTHROPLASTY BIPOLAR HIP (HEMIARTHROPLASTY);  Surgeon: Corky Mull, MD;  Location: [facility] ORS;  Service: Orthopedics;  Laterality: Right;  . 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 ORIF SCAPHOID FRACTURE Left 12/21/2019   Procedure: OPEN REDUCTION INTERNAL FIXATION (ORIF) OF LEFT SCAPULAR NONUNION WITH BONE GRAFT;  Surgeon: Corky Mull, MD;  Location: [facility] ORS;  Service: Orthopedics;  Laterality: Left;  . osteoporosis     noted DEXA 08/19/16   . REPLACEMENT TOTAL KNEE Right   . REVERSE SHOULDER ARTHROPLASTY Right 11/04/2017   Procedure: REVERSE SHOULDER ARTHROPLASTY;  Surgeon: Corky Mull, MD;  Location: [facility] ORS;  Service: Orthopedics;  Laterality: Right;  . REVERSE SHOULDER ARTHROPLASTY Left 07/26/2018   Procedure: REVERSE SHOULDER ARTHROPLASTY;  Surgeon: Corky Mull, MD;  Location: [facility] ORS;  Service: Orthopedics;  Laterality: Left;  . TONSILLECTOMY    . TOTAL HIP ARTHROPLASTY  12/10/11   [facility] left hip  . TOTAL HIP ARTHROPLASTY Bilateral   . TUBAL LIGATION     Family History  Problem Relation Age of Onset  . Rheum arthritis Mother   . Asthma Mother   . Parkinson's disease Mother   .  Heart disease Mother   . Stroke Mother   . Hypertension Mother   . Heart attack Father   . Heart disease Father   . Hypertension Father   . Peripheral Artery Disease Father   . Diabetes Son   . Gout Son   . Asthma Sister   . Heart disease Sister   . Lung cancer Sister   .  Heart disease Sister   . Heart disease Sister   . Breast cancer Sister   . Heart attack Sister   . Heart disease Brother   . Heart disease Maternal Grandmother   . Diabetes Maternal Grandmother   . Colon cancer Maternal Grandmother   . Cancer Maternal Grandmother        Hodgkins lymphoma  . Heart disease Brother   . Alcohol abuse Brother   . Depression Brother   . Dementia Son    Social History   Socioeconomic History  . Marital status: Widowed    Spouse name: richard  . Number of children: 2  . Years of education: Some Coll  . Highest education level: Some college, no degree  Occupational History  . Occupation: Retired    Comment: retired  Tobacco Use  . Smoking status: Former Smoker    Packs/day: 0.50    Years: 20.00    Pack years: 10.00    Types: Cigarettes    Quit date: 02/02/1974    Years since quitting: 46.1  . Smokeless tobacco: Never Used  Vaping Use  . Vaping Use: Never used  Substance and Sexual Activity  . Alcohol use: No  . Drug use: No  . Sexual activity: Not Currently  Other Topics Concern  . Not on file  Social History Narrative   Lives at home in [place] grandson, his wife and their child.   Husband Vetra Shinall died covid 38 late 1/2071married x 50 years   Right-handed.   6 cups coffee per day.   Social Determinants of Health   Financial Resource Strain: Low Risk   . Difficulty of Paying Living Expenses: Not hard at all  Food Insecurity: No Food Insecurity  . Worried About Charity fundraiser in the Last Year: Never true  . Ran Out of Food in the Last Year: Never true  Transportation Needs: No Transportation Needs  . Lack of Transportation (Medical): No  .  Lack of Transportation (Non-Medical): No  Physical Activity: Inactive  . Days of Exercise per Week: 0 days  . Minutes of Exercise per Session: 0 min  Stress: No Stress Concern Present  . Feeling of Stress : Not at all  Social Connections: Not on file   Allergies  Allergen Reactions  . Ceftin [Cefuroxime Axetil] Anaphylaxis  . Lisinopril Anaphylaxis  . Sulfa Antibiotics Other (See Comments)    Face swelling  . Sulfasalazine Anaphylaxis  . Morphine Other (See Comments)    Per patient, low blood pressure issues that requires action to raise it back up. Can take small infrequent doses  . Xarelto [Rivaroxaban] Other (See Comments)    Stomach burning, bleeding, and tar in stool  . Adhesive [Tape] Rash    Paper tape and tega derm OK  . Antihistamines, Chlorpheniramine-Type Other (See Comments)    Makes pt hyper  . Antivert [Meclizine Hcl] Other (See Comments)    Bladder will not empty  . Aspirin Other (See Comments)    Sulfasalazine allergy cross reacts  . Contrast Media [Iodinated Diagnostic Agents] Rash    she is able to use betadine scrubs.  [place] [Pseudoephedrine Hcl] Other (See Comments)    Makes pt hyper  . Doxycycline Other (See Comments)    GI upset  . Levaquin [Levofloxacin In D5w] Rash  . Polymyxin B Other (See Comments)    Facial rash  . Tetanus Toxoids Rash and Other (See Comments)    Fever and hot to touch at injection site  Medications  (Not in a hospital admission)    Vitals   Vitals:   03/24/20 1515  BP: (!) 146/65  Pulse: 73  Resp: 15  SpO2: 94%     There is no height or weight on file to calculate BMI.  Physical Exam   General: Laying comfortably in bed; in no acute distress.  HENT: Normal oropharynx and mucosa. Normal external appearance of ears and nose.  Neck: Supple, no pain or tenderness  CV: No JVD. No peripheral edema.  Pulmonary: Symmetric Chest rise. Normal respiratory effort.  Abdomen: Soft to touch, non-tender.  Ext: No  cyanosis, edema, or deformity  Skin: No rash. Normal palpation of skin.   Musculoskeletal: Normal digits and nails by inspection. No clubbing.   Neurologic Examination  Mental status/Cognition: Alert, oriented to self, place, month and year, good attention.  Speech/language: Fluent, comprehension intact, object naming intact, repetition intact. Cranial nerves:   CN II Pupils equal and reactive to light, no VF deficits   CN III,IV,VI EOM intact, no gaze preference or deviation, no nystagmus   CN V normal sensation in V1, V2, and V3 segments bilaterally   CN VII no asymmetry, no nasolabial fold flattening   CN VIII normal hearing to speech   CN IX & X normal palatal elevation, no uvular deviation   CN XI 5/5 head turn and 5/5 shoulder shrug bilaterally   CN XII midline tongue protrusion   Motor:  Muscle bulk: normla, tone normal, pronator drift none tremor none Mvmt Root Nerve  Muscle Right Left Comments  SA C5/6 Ax Deltoid 5 5   EF C5/6 Mc Biceps 5 5   EE C6/7/8 Rad Triceps 5 5   WF C6/7 Med FCR 5 5   WE C7/8 PIN ECU 5 5   F Ab C8/T1 U ADM/FDI 5 5   HF L1/2/3 Fem Illopsoas 4 4   KE L2/3/4 Fem Quad     DF L4/5 D Peron Tib Ant     PF S1/2 Tibial Grc/Sol 5 5    Sensation:  Light touch Intact throughout   Pin prick    Temperature    Vibration   Proprioception    Coordination/Complex Motor:  - Finger to Nose intact BL  Labs   CBC:  Recent Labs  Lab 03/24/20 1517  WBC 9.9  NEUTROABS 8.0*  HGB 12.5  HCT 39.4  MCV 91.0  PLT 124    Basic Metabolic Panel:  Lab Results  Component Value Date   NA 138 03/24/2020   K 4.6 03/24/2020   CO2 23 03/24/2020   GLUCOSE 170 (H) 03/24/2020   BUN 22 03/24/2020   CREATININE 1.52 (H) 03/24/2020   CALCIUM 8.5 (L) 03/24/2020   GFRNONAA 35 (L) 03/24/2020   GFRAA 39 (L) 02/21/2019   Lipid Panel:  Lab Results  Component Value Date   LDLCALC 60 12/01/2018   HgbA1c:  Lab Results  Component Value Date   HGBA1C 6.1 12/01/2019    Urine Drug Screen: No results found for: LABOPIA, COCAINSCRNUR, LABBENZ, AMPHETMU, THCU, LABBARB  Alcohol Level No results found for: ETH  CT Head without contrast: CTH was negative for a large hypodensity concerning for a large territory infarct or hyperdensity concerning for an ICH  MRI Brain: pending.  Impression   Kerri Carter is a 80 y.o. female with multiple comorbidites who presents with stuttering, crying, no other focal deficit, althout somewhat poor participation in exam. MY suspicion for a large stroke is pretty low  based on exam. On discussion with grand daughter who lives with patient, she has known hx of conversion disorder and althou her prior presentation have not quite been like this, she has had stuttering and crying during those.  Recommendations  - Will get an MRI Brain without contrast. If negative for an acute stroke, no further workup and can follow up as needed. ______________________________________________________________________   Thank you for the opportunity to take part in the care of this patient. If you have any further questions, please contact the neurology consultation attending.  Signed,  [place] Pager Number [phone] _ _ _   _ __   _ __ _ _  __ __   _ __   __ _

## 2020-03-24 NOTE — ED Notes (Signed)
No CT angio, going ahead with MRI

## 2020-03-24 NOTE — ED Notes (Signed)
Per neurology no TPA. Patient to go for CT angio.

## 2020-03-24 NOTE — ED Notes (Signed)
Patient returned from MRI, speech now clear, no slurred/stuttering.

## 2020-03-24 NOTE — ED Notes (Signed)
ED Provider at bedside. 

## 2020-03-24 NOTE — ED Notes (Signed)
Neurology at bedside.

## 2020-03-24 NOTE — ED Notes (Signed)
Patient not ready for discharge at this time, remains lethargic from ativan, patient to be discharged once more alert.

## 2020-03-24 NOTE — ED Provider Notes (Signed)
[facility] Emergency Department Provider Note   ____________________________________________   Event Date/Time   First MD Initiated Contact with Patient 03/24/20 1504     (approximate)  I have reviewed the triage vital signs and the nursing notes.   HISTORY  Chief Complaint Code Stroke    HPI Kerri Carter is a 79 y.o. female with past medical history of hypertension, hyperlipidemia, COPD, CKD, rheumatoid arthritis, PAD, GERD, and bipolar disorder who presents to the ED for speech difficulties.  Patient states she woke up this morning feeling much more tired than usual, but did not noticed any speech difficulties, numbness, or weakness.  She was then sitting with a friend while family was at church, when she started to notice speech difficulties around 1 PM this afternoon.  Family returned from church to find patient having difficulty speaking, stuttering and having difficulty getting words out.  She continues to complain of generalized weakness, but denies any vision changes, or focal numbness/weakness in her extremities.  She deals with a chronic cough and difficulty breathing, which have been slightly increased recently, but she denies any fevers or chest pain.  She has not had any abdominal pain, nausea, vomiting, diarrhea, dysuria, or hematuria.        Past Medical History:  Diagnosis Date  . Anxiety   . Asthma   . Bipolar disorder ([place])   . C. difficile diarrhea    from Abx 2010 hospitalized   . Cataract    s/p b/l repair   . Chicken pox   . CKD (chronic kidney disease)   . CKD (chronic kidney disease), stage III ([place])    a. s/p R nephrectomy./ aneurysm  . Conversion disorder   . COPD (chronic obstructive pulmonary disease) ([place])   . Depression   . Essential hypertension   . GERD (gastroesophageal reflux disease)   . Hyperlipidemia   . ILD (interstitial lung disease) ([place])    mild; 2/2 RA diagnosis  . Inflammatory arthritis    a.  hands/carpal tunnel.  b. Low titer rheumatoid factor. c. Negative anti-CCP antibodies. d. Plaquenil.  . Nocturnal hypoxemia   . Non-Obstructive CAD    a. 07/2009 Cath ([facility]): nonobs dzs;  b. 03/2011 Cath [facility]): nonobs dzs.  . Osteoarthritis    a. Knees.  Marland Kitchen PAD (peripheral artery disease) ([place])   . PUD (peptic ulcer disease)   . S/P right hip fracture    11/01/16 s/p repair  . Shoulder pain   . Sleep apnea    no cpap / minimal  . Spinal stenosis at L4-L5 level    severe with L4/L5 anterolisthesis grade 1 anterolisthesis   . Toxic maculopathy   . Valvular heart disease    a. 07/2015 Echo: EF 55-60%, Mild AI, AS, MR, and TR.    Patient Active Problem List   Diagnosis Date Noted  . Acromial process of scapula fracture 12/21/2019  . NSIP (nonspecific interstitial pneumonitis) ([place]) 12/08/2019  . Iliopsoas bursitis of right hip 08/29/2019  . Chronic left shoulder pain 08/29/2019  . Overweight (BMI 25.0-29.9) 08/29/2019  . Hypertriglyceridemia 07/24/2019  . Bipolar disorder, in full remission, most recent episode depressed ([place]) 07/13/2019  . Essential hypertension 06/16/2019  . Chronic pain 06/16/2019  . Lymphedema 06/05/2019  . Chronic venous insufficiency 05/25/2019  . PAD (peripheral artery disease) ([place]) 05/25/2019  . Leg edema 05/02/2019  . Episodic mood disorder ([place]) 04/18/2019  . Complicated grief 33/82/5053  . At high risk for falls 03/16/2019  .  Pelvic mass in female 03/16/2019  . Compression fracture of L4 vertebra (HCC) 02/21/2019  . Collapsed vertebra, not elsewhere classified, lumbar region, initial encounter for fracture ([place]) 02/21/2019  . Anxiety 01/20/2019  . Pain due to onychomycosis of toenails of both feet 12/05/2018  . Hav (hallux abducto valgus), unspecified laterality 12/05/2018  . Closed fracture of acromial process of scapula 10/28/2018  . Status post reverse arthroplasty of shoulder, left 07/26/2018  . Interstitial pulmonary disease ([place]) 07/07/2018  .  Fatigue 07/07/2018  . Avascular necrosis of left shoulder due to adverse effect of steroid therapy ([place]) 01/20/2018  . Other shoulder lesions, left shoulder 12/20/2017  . Status post reverse total shoulder replacement, right 11/04/2017  . Leukocytosis 10/19/2017  . Allergic rhinitis 09/28/2017  . Rotator cuff tendinitis, right 07/26/2017  . Strain of right hip 02/26/2017  . History of fracture of left hip 01/15/2017  . Status post lumbar spinal fusion 01/15/2017  . Dislocation of hip joint prosthesis ([place]) 01/08/2017  . Status post hip hemiarthroplasty 01/08/2017  . Leg swelling 12/15/2016  . Age-related osteoporosis without current pathological fracture 11/05/2016  . Hip fracture ([place]) 10/31/2016  . Polyneuropathy 10/28/2016  . Left foot pain 10/19/2016  . Spinal stenosis of lumbar region 09/15/2016  . Osteoporosis 08/27/2016  . Drug-induced osteoporosis 08/27/2016  . Adnexal mass 08/25/2016  . Prediabetes 08/25/2016  . Coronary atherosclerosis 08/25/2016  . History of syncope 08/25/2016  . Hypokalemia 08/25/2016  . Mixed bipolar I disorder ([place]) 10/09/2015  . Stage 3b chronic kidney disease ([place])   . Conversion disorder 08/04/2015  . Hyperlipidemia 07/17/2015  . 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 Macular degeneration 07/17/2015  . Insomnia 07/17/2015  . Rheumatoid arthritis ([place]) 12/05/2014  . Other specified postprocedural states 07/20/2013  . Anxiety and depression 06/13/2013  . Osteoarthritis of knee 06/07/2013  . Cough 05/02/2012  . Valvular heart disease 10/13/2011  . Asthma-COPD overlap syndrome ([place]) 09/09/2011  . OSA (obstructive sleep apnea) 09/09/2011  . Nocturnal hypoxemia 09/09/2011  . Gastroesophageal reflux disease 02/24/2011  . Single kidney 02/24/2011  . H/O cardiac catheterization 02/24/2011  . Renal artery stenosis ([place]) 02/24/2011    Past Surgical History:  Procedure Laterality Date  . APPENDECTOMY    . BACK SURGERY      lumbar fusion  . BUNIONECTOMY Right   . CATARACT EXTRACTION, BILATERAL    . CESAREAN SECTION     x1  . CHOLECYSTECTOMY N/A 05/11/2016   Procedure: LAPAROSCOPIC CHOLECYSTECTOMY;  Surgeon: Florene Glen, MD;  Location: [facility] ORS;  Service: General;  Laterality: N/A;  . COLONOSCOPY WITH PROPOFOL N/A 04/02/2016   Procedure: COLONOSCOPY WITH PROPOFOL;  Surgeon: Jonathon Bellows, MD;  Location: [facility] ENDOSCOPY;  Service: Endoscopy;  Laterality: N/A;  . ENDOSCOPIC RETROGRADE CHOLANGIOPANCREATOGRAPHY (ERCP) WITH PROPOFOL N/A 05/08/2016   Procedure: ENDOSCOPIC RETROGRADE CHOLANGIOPANCREATOGRAPHY (ERCP) WITH PROPOFOL;  Surgeon: Lucilla Lame, MD;  Location: [facility] ENDOSCOPY;  Service: Endoscopy;  Laterality: N/A;  . ERCP     with biliary spincterotomy 05/08/16 Dr. Allen Norris for choledocholithiasis   . ESOPHAGEAL DILATION  04/02/2016   Procedure: ESOPHAGEAL DILATION;  Surgeon: Jonathon Bellows, MD;  Location: [facility] ENDOSCOPY;  Service: Endoscopy;;  . ESOPHAGOGASTRODUODENOSCOPY (EGD) WITH PROPOFOL N/A 04/02/2016   Procedure: ESOPHAGOGASTRODUODENOSCOPY (EGD) WITH PROPOFOL;  Surgeon: Jonathon Bellows, MD;  Location: [facility] ENDOSCOPY;  Service: Endoscopy;  Laterality: N/A;  . HIP ARTHROPLASTY Right 11/01/2016   Procedure: ARTHROPLASTY BIPOLAR HIP (HEMIARTHROPLASTY);  Surgeon: Corky Mull, MD;  Location: [facility] ORS;  Service: Orthopedics;  Laterality: Right;  . 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 ORIF SCAPHOID FRACTURE Left 12/21/2019   Procedure: OPEN REDUCTION INTERNAL FIXATION (ORIF) OF LEFT SCAPULAR NONUNION WITH BONE GRAFT;  Surgeon: Corky Mull, MD;  Location: [facility] ORS;  Service: Orthopedics;  Laterality: Left;  . osteoporosis     noted DEXA 08/19/16   . REPLACEMENT TOTAL KNEE Right   . REVERSE SHOULDER ARTHROPLASTY Right 11/04/2017   Procedure: REVERSE SHOULDER ARTHROPLASTY;  Surgeon: Corky Mull, MD;  Location: [facility] ORS;  Service: Orthopedics;  Laterality: Right;  . REVERSE  SHOULDER ARTHROPLASTY Left 07/26/2018   Procedure: REVERSE SHOULDER ARTHROPLASTY;  Surgeon: Corky Mull, MD;  Location: [facility] ORS;  Service: Orthopedics;  Laterality: Left;  . TONSILLECTOMY    . TOTAL HIP ARTHROPLASTY  12/10/11   [facility] left hip  . TOTAL HIP ARTHROPLASTY Bilateral   . TUBAL LIGATION      Prior to Admission medications   Medication Sig Start Date End Date Taking? Authorizing Provider  Adalimumab 40 MG/0.4ML PNKT Inject 40 mg into the skin every 14 (fourteen) days.    Yes [provider]  ALPRAZolam (XANAX) 0.25 MG tablet Take 1 tablet (0.25 mg total) by mouth at bedtime as needed for anxiety. 02/13/20  Yes McLean-Scocuzza, Nino Glow, MD  amLODipine (NORVASC) 2.5 MG tablet Take 2.5 mg by mouth daily.   Yes [provider]  Cholecalciferol 50 MCG (2000 UT) CAPS Take 4,000 Units by mouth daily.  08/25/19 08/24/20 Yes [provider]  escitalopram (LEXAPRO) 10 MG tablet Take 1 tablet (10 mg total) by mouth daily. 07/13/19  Yes Nevada Crane, MD  etodolac (LODINE) 500 MG tablet Take 500 mg by mouth at bedtime. 03/22/20  Yes [provider]  famotidine (PEPCID) 20 MG tablet Take 1 tablet (20 mg total) by mouth daily. Before breakfast or dinner 01/19/19  Yes McLean-Scocuzza, Nino Glow, MD  fluticasone (FLONASE) 50 MCG/ACT nasal spray Place 2 sprays into both nostrils daily. In am 12/08/19  Yes McLean-Scocuzza, Nino Glow, MD  gabapentin (NEURONTIN) 300 MG capsule Take 2 capsules (600 mg total) by mouth in the morning and at bedtime. 12/08/19  Yes McLean-Scocuzza, Nino Glow, MD  ipratropium (ATROVENT HFA) 17 MCG/ACT inhaler Inhale 1 puff into the lungs daily.   Yes [provider]  lamoTRIgine (LAMICTAL) 100 MG tablet Take 1 tablet (100 mg total) by mouth 2 (two) times daily. 02/22/20  Yes Ursula Alert, MD  leflunomide (ARAVA) 20 MG tablet Take 1 tablet (20 mg total) by mouth daily. 09/30/17  Yes McLean-Scocuzza, Nino Glow, MD  lovastatin (MEVACOR) 20 MG tablet  Take 1 tablet (20 mg total) by mouth at bedtime. 08/28/19  Yes McLean-Scocuzza, Nino Glow, MD  montelukast (SINGULAIR) 10 MG tablet Take 1 tablet (10 mg total) by mouth daily. 07/20/19  Yes McLean-Scocuzza, Nino Glow, MD  MYRBETRIQ 50 MG TB24 tablet TAKE 1 TABLET BY MOUTH EVERYDAY AT BEDTIME Patient taking differently: Take 50 mg by mouth daily. 02/20/20  Yes Hollice Espy, MD  nebivolol (BYSTOLIC) 10 MG tablet Take 0.5 tablets (5 mg total) by mouth in the morning and at bedtime. 12/08/19  Yes McLean-Scocuzza, Nino Glow, MD  pantoprazole (PROTONIX) 40 MG tablet Take 1 tablet (40 mg total) by mouth 2 (two) times daily. 30 minutes before food. Note reduction in frequency 03/07/19  Yes McLean-Scocuzza, Nino Glow, MD  predniSONE (STERAPRED UNI-PAK 21 TAB) 10 MG (21) TBPK tablet Use as directed.  03/22/20  Yes [name], MD  QUEtiapine (SEROQUEL) 25 MG tablet Take 1 tablet (25 mg total) by mouth at bedtime. 07/13/19  Yes Nevada Crane, MD  sucralfate (CARAFATE) 1 g tablet Take 1 tablet (1 g total) by mouth 2 (two) times daily. 12/08/19 12/02/20 Yes McLean-Scocuzza, Nino Glow, MD  albuterol (VENTOLIN HFA) 108 (90 Base) MCG/ACT inhaler Inhale 2 puffs into the lungs every 6 (six) hours as needed. Patient taking differently: Inhale 2 puffs into the lungs every 6 (six) hours as needed for wheezing or shortness of breath. 11/25/15   Juanito Doom, MD  benzonatate (TESSALON) 200 MG capsule Take 1 capsule (200 mg total) by mouth 3 (three) times daily as needed for cough. Patient not taking: No sig reported 09/22/19   [name], MD  budesonide (PULMICORT) 0.25 MG/2ML nebulizer solution USE 1 VIAL  IN  NEBULIZER TWICE  DAILY - rinse mouth after treatment Patient not taking: Reported on 03/24/2020 12/01/19   [name], MD  dicyclomine (BENTYL) 10 MG capsule TAKE 1 CAPSULE (10 MG TOTAL) BY MOUTH 4 (FOUR) TIMES DAILY - BEFORE MEALS AND AT BEDTIME. Patient taking differently: Take 20 mg by mouth in the  morning and at bedtime.  01/17/19 01/12/20  Jonathon Bellows, MD  enoxaparin (LOVENOX) 40 MG/0.4ML injection Inject 0.4 mLs (40 mg total) into the skin daily. Patient not taking: No sig reported 12/22/19   Lattie Corns, PA-C  ipratropium-albuterol (DUONEB) 0.5-2.5 (3) MG/3ML SOLN Take 3 mLs by nebulization 3 (three) times daily as needed. Patient taking differently: Take 3 mLs by nebulization 3 (three) times daily as needed (shortness of breath). 07/07/19   [name], MD  predniSONE (DELTASONE) 5 MG tablet Take 1.5 tablets (7.5 mg total) by mouth daily with breakfast. 12/19/19   [name], MD    Allergies Ceftin [cefuroxime axetil]; Lisinopril; Sulfa antibiotics; Sulfasalazine; Morphine; Xarelto [rivaroxaban]; Adhesive [tape]; Antihistamines, chlorpheniramine-type; Antivert [meclizine hcl]; Aspirin; Contrast media [iodinated diagnostic agents]; Decongestant [pseudoephedrine hcl]; Doxycycline; Levaquin [levofloxacin in d5w]; Polymyxin b; and Tetanus toxoids  Family History  Problem Relation Age of Onset  . Rheum arthritis Mother   . Asthma Mother   . Parkinson's disease Mother   . Heart disease Mother   . Stroke Mother   . Hypertension Mother   . Heart attack Father   . Heart disease Father   . Hypertension Father   . Peripheral Artery Disease Father   . Diabetes Son   . Gout Son   . Asthma Sister   . Heart disease Sister   . Lung cancer Sister   . Heart disease Sister   . Heart disease Sister   . Breast cancer Sister   . Heart attack Sister   . Heart disease Brother   . Heart disease Maternal Grandmother   . Diabetes Maternal Grandmother   . Colon cancer Maternal Grandmother   . Cancer Maternal Grandmother        Hodgkins lymphoma  . Heart disease Brother   . Alcohol abuse Brother   . Depression Brother   . Dementia Son     Social History Social History   Tobacco Use  . Smoking status: Former Smoker    Packs/day: 0.50    Years: 20.00    Pack years:  10.00    Types: Cigarettes    Quit date: 02/02/1974    Years since quitting: 46.1  . Smokeless tobacco: Never Used  Vaping Use  . Vaping Use: Never used  Substance Use Topics  . Alcohol use: No  . Drug use: No    Review of Systems  Constitutional: No fever/chills Eyes: No visual changes. ENT: No sore throat. Cardiovascular: Denies chest pain. Respiratory: Denies shortness of breath. Gastrointestinal: No abdominal pain.  No nausea, no vomiting.  No diarrhea.  No constipation. Genitourinary: Negative for dysuria. Musculoskeletal: Negative for back pain. Skin: Negative for rash. Neurological: Negative for headaches, focal weakness or numbness.  Positive for speech difficulties.  ____________________________________________   PHYSICAL EXAM:  VITAL SIGNS: ED Triage Vitals [03/24/20 1501]  Enc Vitals Group     BP      Pulse      Resp      Temp      Temp src      SpO2      Weight      Height      Head Circumference      Peak Flow      Pain Score 0     Pain Loc      Pain Edu?      Excl. in [place]?     Constitutional: Alert and oriented. Eyes: Conjunctivae are normal. Head: Atraumatic. Nose: No congestion/rhinnorhea. Mouth/Throat: Mucous membranes are moist. Neck: Normal ROM Cardiovascular: Normal rate, regular rhythm. Grossly normal heart sounds. Respiratory: Normal respiratory effort.  No retractions. Lungs CTAB. Gastrointestinal: Soft and nontender. No distention. Genitourinary: deferred Musculoskeletal: No lower extremity tenderness nor edema. Neurologic: Stuttering speech pattern with occasional word finding difficulties. No gross focal neurologic deficits are appreciated.  5 out of 5 strength in bilateral upper and lower extremities with no pronator drift. Skin:  Skin is warm, dry and intact. No rash noted. Psychiatric: Mood and affect are normal. Speech and behavior are normal.  ____________________________________________   LABS (all labs ordered are  listed, but only abnormal results are displayed)  Labs Reviewed  DIFFERENTIAL - Abnormal; Notable for the following components:      Result Value   Neutro Abs 8.0 (*)    Abs Immature Granulocytes 0.16 (*)    All other components within normal limits  COMPREHENSIVE METABOLIC PANEL - Abnormal; Notable for the following components:   Glucose, Bld 170 (*)    Creatinine, Ser 1.52 (*)    Calcium 8.5 (*)    GFR, Estimated 35 (*)    All other components within normal limits  URINALYSIS, COMPLETE (UACMP) WITH MICROSCOPIC - Abnormal; Notable for the following components:   Color, Urine YELLOW (*)    APPearance CLEAR (*)    All other components within normal limits  CBG MONITORING, ED - Abnormal; Notable for the following components:   Glucose-Capillary 159 (*)    All other components within normal limits  TROPONIN I (HIGH SENSITIVITY) - Abnormal; Notable for the following components:   Troponin I (High Sensitivity) 39 (*)    All other components within normal limits  PROTIME-INR  APTT  CBC   ____________________________________________  EKG  ED ECG REPORT I, Blake Divine, the attending physician, personally viewed and interpreted this ECG.   Date: 03/24/2020  EKG Time: 15:15  Rate: 77  Rhythm: normal sinus rhythm  Axis: Normal  Intervals:none  ST&T Change: None   PROCEDURES  Procedure(s) performed (including Critical Care):  Procedures   ____________________________________________   INITIAL IMPRESSION / ASSESSMENT AND PLAN / ED COURSE       80 year old female with past medical history of hypertension, hyperlipidemia, COPD, CKD, rheumatoid arthritis, peripheral arterial disease, GERD, and bipolar disorder who presents  to the ED with speech difficulties onset at 1 PM this afternoon.  Patient continues to have stuttering speech with difficulty getting words out but no focal deficits on exam.  CT head reviewed by me with no obvious hemorrhage and negative for acute  process per radiology.  EKG shows normal sinus rhythm with no acute ischemic changes, labs and UA are pending.  We will also check chest x-ray given her increased cough and difficulty breathing, however she is not in any respiratory distress and she is maintaining O2 sats on room air.  Chest x-ray reviewed by me and shows no infiltrate, edema, or effusion.  Lab work is remarkable for very mildly elevated troponin, consistent with patient's chronic kidney disease, low suspicion for ACS.  Patient evaluated by neurology, who believes patient's speech changes to likely be functional in origin.  Neurology recommends MRI brain, but if this is negative patient would not require further neurologic work-up, especially given her history of conversion disorder.  MRI brain is negative for acute process, additional labs are unremarkable and UA shows no evidence of infection.  Patient is appropriate for discharge home with PCP follow-up, patient and family counseled to return to the ED for new worsening symptoms.      ____________________________________________   FINAL CLINICAL IMPRESSION(S) / ED DIAGNOSES  Final diagnoses:  Slurred speech  Conversion disorder     ED Discharge Orders    None       Note:  This document was prepared using Dragon voice recognition software and may include unintentional dictation errors.   Blake Divine, MD 03/24/20 [phone]

## 2020-03-24 NOTE — ED Notes (Signed)
Code stroke called to [facility] at 3:26 pm

## 2020-03-24 NOTE — ED Notes (Signed)
Patient continues to sleep, family at bedside.

## 2020-03-25 DIAGNOSIS — S42122D Displaced fracture of acromial process, left shoulder, subsequent encounter for fracture with routine healing: Secondary | ICD-10-CM | POA: Diagnosis not present

## 2020-03-25 DIAGNOSIS — I129 Hypertensive chronic kidney disease with stage 1 through stage 4 chronic kidney disease, or unspecified chronic kidney disease: Secondary | ICD-10-CM | POA: Diagnosis not present

## 2020-03-25 DIAGNOSIS — N1832 Chronic kidney disease, stage 3b: Secondary | ICD-10-CM | POA: Diagnosis not present

## 2020-03-25 DIAGNOSIS — E1151 Type 2 diabetes mellitus with diabetic peripheral angiopathy without gangrene: Secondary | ICD-10-CM | POA: Diagnosis not present

## 2020-03-25 DIAGNOSIS — E1122 Type 2 diabetes mellitus with diabetic chronic kidney disease: Secondary | ICD-10-CM | POA: Diagnosis not present

## 2020-03-25 DIAGNOSIS — D631 Anemia in chronic kidney disease: Secondary | ICD-10-CM | POA: Diagnosis not present

## 2020-03-25 DIAGNOSIS — E1142 Type 2 diabetes mellitus with diabetic polyneuropathy: Secondary | ICD-10-CM | POA: Diagnosis not present

## 2020-03-25 DIAGNOSIS — J449 Chronic obstructive pulmonary disease, unspecified: Secondary | ICD-10-CM | POA: Diagnosis not present

## 2020-03-25 DIAGNOSIS — I872 Venous insufficiency (chronic) (peripheral): Secondary | ICD-10-CM | POA: Diagnosis not present

## 2020-03-26 DIAGNOSIS — S42122D Displaced fracture of acromial process, left shoulder, subsequent encounter for fracture with routine healing: Secondary | ICD-10-CM | POA: Diagnosis not present

## 2020-03-26 DIAGNOSIS — E1122 Type 2 diabetes mellitus with diabetic chronic kidney disease: Secondary | ICD-10-CM | POA: Diagnosis not present

## 2020-03-26 DIAGNOSIS — E1142 Type 2 diabetes mellitus with diabetic polyneuropathy: Secondary | ICD-10-CM | POA: Diagnosis not present

## 2020-03-26 DIAGNOSIS — N1832 Chronic kidney disease, stage 3b: Secondary | ICD-10-CM | POA: Diagnosis not present

## 2020-03-26 DIAGNOSIS — D631 Anemia in chronic kidney disease: Secondary | ICD-10-CM | POA: Diagnosis not present

## 2020-03-26 DIAGNOSIS — I872 Venous insufficiency (chronic) (peripheral): Secondary | ICD-10-CM | POA: Diagnosis not present

## 2020-03-26 DIAGNOSIS — I129 Hypertensive chronic kidney disease with stage 1 through stage 4 chronic kidney disease, or unspecified chronic kidney disease: Secondary | ICD-10-CM | POA: Diagnosis not present

## 2020-03-26 DIAGNOSIS — J449 Chronic obstructive pulmonary disease, unspecified: Secondary | ICD-10-CM | POA: Diagnosis not present

## 2020-03-26 DIAGNOSIS — E1151 Type 2 diabetes mellitus with diabetic peripheral angiopathy without gangrene: Secondary | ICD-10-CM | POA: Diagnosis not present

## 2020-03-28 ENCOUNTER — Ambulatory Visit: Payer: Medicare PPO | Admitting: Podiatry

## 2020-03-28 ENCOUNTER — Encounter: Payer: Self-pay | Admitting: Podiatry

## 2020-03-28 ENCOUNTER — Other Ambulatory Visit: Payer: Self-pay

## 2020-03-28 DIAGNOSIS — B351 Tinea unguium: Secondary | ICD-10-CM

## 2020-03-28 DIAGNOSIS — G629 Polyneuropathy, unspecified: Secondary | ICD-10-CM

## 2020-03-28 DIAGNOSIS — N1832 Chronic kidney disease, stage 3b: Secondary | ICD-10-CM | POA: Diagnosis not present

## 2020-03-28 DIAGNOSIS — M79675 Pain in left toe(s): Secondary | ICD-10-CM

## 2020-03-28 DIAGNOSIS — M2010 Hallux valgus (acquired), unspecified foot: Secondary | ICD-10-CM

## 2020-03-28 DIAGNOSIS — I890 Lymphedema, not elsewhere classified: Secondary | ICD-10-CM

## 2020-03-28 DIAGNOSIS — I739 Peripheral vascular disease, unspecified: Secondary | ICD-10-CM | POA: Diagnosis not present

## 2020-03-28 DIAGNOSIS — M79674 Pain in right toe(s): Secondary | ICD-10-CM

## 2020-03-28 DIAGNOSIS — N183 Chronic kidney disease, stage 3 unspecified: Secondary | ICD-10-CM

## 2020-03-28 NOTE — Progress Notes (Signed)
This patient returns to my office for at risk foot care.  This patient requires this care by a professional since this patient will be at risk due to having chronic kidney disease, lymphedema and PAD.Kerri Carter  This patient is unable to cut nails herself since the patient cannot reach her nails.These nails are painful walking and wearing shoes.  This patient presents for at risk foot care today.  General Appearance  Alert, conversant and in no acute stress.  Vascular  Dorsalis pedis  are palpable  bilaterally. Posterior tibial pulses are absent  B/L. Capillary return is within normal limits  bilaterally. Cold feet  Bilaterally.  Absent digital hair  B/L.  Neurologic  Senn-Weinstein monofilament wire test diminished  bilaterally. Muscle power within normal limits bilaterally.  Nails Thick disfigured discolored nails with subungual debris  from hallux to fifth toes bilaterally. No evidence of bacterial infection or drainage bilaterally.  Orthopedic  No limitations of motion  feet .  No crepitus or effusions noted.  No bony pathology or digital deformities noted.    HAV  B/L.  Pes planus  B/L.  Skin  normotropic skin with no porokeratosis noted bilaterally.  No signs of infections or ulcers noted.     Onychomycosis  Pain in right toes  Pain in left toes  Consent was obtained for treatment procedures.   Mechanical debridement of nails 1-5  bilaterally performed with a nail nipper.  Filed with dremel without incident.    Return office visit    3 months                  Told patient to return for periodic foot care and evaluation due to potential at risk complications.   Gardiner Barefoot DPM

## 2020-03-30 ENCOUNTER — Other Ambulatory Visit: Payer: Self-pay | Admitting: Pulmonary Disease

## 2020-03-31 DIAGNOSIS — I129 Hypertensive chronic kidney disease with stage 1 through stage 4 chronic kidney disease, or unspecified chronic kidney disease: Secondary | ICD-10-CM | POA: Diagnosis not present

## 2020-03-31 DIAGNOSIS — S42122D Displaced fracture of acromial process, left shoulder, subsequent encounter for fracture with routine healing: Secondary | ICD-10-CM | POA: Diagnosis not present

## 2020-03-31 DIAGNOSIS — I872 Venous insufficiency (chronic) (peripheral): Secondary | ICD-10-CM | POA: Diagnosis not present

## 2020-03-31 DIAGNOSIS — J449 Chronic obstructive pulmonary disease, unspecified: Secondary | ICD-10-CM | POA: Diagnosis not present

## 2020-03-31 DIAGNOSIS — E1142 Type 2 diabetes mellitus with diabetic polyneuropathy: Secondary | ICD-10-CM | POA: Diagnosis not present

## 2020-03-31 DIAGNOSIS — D631 Anemia in chronic kidney disease: Secondary | ICD-10-CM | POA: Diagnosis not present

## 2020-03-31 DIAGNOSIS — E1122 Type 2 diabetes mellitus with diabetic chronic kidney disease: Secondary | ICD-10-CM | POA: Diagnosis not present

## 2020-03-31 DIAGNOSIS — E1151 Type 2 diabetes mellitus with diabetic peripheral angiopathy without gangrene: Secondary | ICD-10-CM | POA: Diagnosis not present

## 2020-03-31 DIAGNOSIS — N1832 Chronic kidney disease, stage 3b: Secondary | ICD-10-CM | POA: Diagnosis not present

## 2020-04-01 DIAGNOSIS — E1122 Type 2 diabetes mellitus with diabetic chronic kidney disease: Secondary | ICD-10-CM | POA: Diagnosis not present

## 2020-04-01 DIAGNOSIS — N1832 Chronic kidney disease, stage 3b: Secondary | ICD-10-CM | POA: Diagnosis not present

## 2020-04-01 DIAGNOSIS — I872 Venous insufficiency (chronic) (peripheral): Secondary | ICD-10-CM | POA: Diagnosis not present

## 2020-04-01 DIAGNOSIS — D631 Anemia in chronic kidney disease: Secondary | ICD-10-CM | POA: Diagnosis not present

## 2020-04-01 DIAGNOSIS — S42122D Displaced fracture of acromial process, left shoulder, subsequent encounter for fracture with routine healing: Secondary | ICD-10-CM | POA: Diagnosis not present

## 2020-04-01 DIAGNOSIS — E1142 Type 2 diabetes mellitus with diabetic polyneuropathy: Secondary | ICD-10-CM | POA: Diagnosis not present

## 2020-04-01 DIAGNOSIS — I129 Hypertensive chronic kidney disease with stage 1 through stage 4 chronic kidney disease, or unspecified chronic kidney disease: Secondary | ICD-10-CM | POA: Diagnosis not present

## 2020-04-01 DIAGNOSIS — E1151 Type 2 diabetes mellitus with diabetic peripheral angiopathy without gangrene: Secondary | ICD-10-CM | POA: Diagnosis not present

## 2020-04-01 DIAGNOSIS — J449 Chronic obstructive pulmonary disease, unspecified: Secondary | ICD-10-CM | POA: Diagnosis not present

## 2020-04-02 ENCOUNTER — Encounter: Payer: Self-pay | Admitting: Internal Medicine

## 2020-04-02 ENCOUNTER — Other Ambulatory Visit: Payer: Self-pay

## 2020-04-02 ENCOUNTER — Ambulatory Visit: Payer: Medicare PPO | Admitting: Internal Medicine

## 2020-04-02 VITALS — BP 128/64 | HR 72 | Temp 97.3°F | Ht 65.5 in | Wt 180.1 lb

## 2020-04-02 DIAGNOSIS — G459 Transient cerebral ischemic attack, unspecified: Secondary | ICD-10-CM

## 2020-04-02 DIAGNOSIS — E559 Vitamin D deficiency, unspecified: Secondary | ICD-10-CM | POA: Diagnosis not present

## 2020-04-02 DIAGNOSIS — R4781 Slurred speech: Secondary | ICD-10-CM

## 2020-04-02 DIAGNOSIS — F449 Dissociative and conversion disorder, unspecified: Secondary | ICD-10-CM

## 2020-04-02 DIAGNOSIS — Z1231 Encounter for screening mammogram for malignant neoplasm of breast: Secondary | ICD-10-CM

## 2020-04-02 DIAGNOSIS — R7303 Prediabetes: Secondary | ICD-10-CM | POA: Diagnosis not present

## 2020-04-02 DIAGNOSIS — E1151 Type 2 diabetes mellitus with diabetic peripheral angiopathy without gangrene: Secondary | ICD-10-CM | POA: Diagnosis not present

## 2020-04-02 DIAGNOSIS — E1142 Type 2 diabetes mellitus with diabetic polyneuropathy: Secondary | ICD-10-CM | POA: Diagnosis not present

## 2020-04-02 DIAGNOSIS — I6523 Occlusion and stenosis of bilateral carotid arteries: Secondary | ICD-10-CM

## 2020-04-02 DIAGNOSIS — J8489 Other specified interstitial pulmonary diseases: Secondary | ICD-10-CM

## 2020-04-02 DIAGNOSIS — F32A Depression, unspecified: Secondary | ICD-10-CM

## 2020-04-02 DIAGNOSIS — F3176 Bipolar disorder, in full remission, most recent episode depressed: Secondary | ICD-10-CM

## 2020-04-02 DIAGNOSIS — E049 Nontoxic goiter, unspecified: Secondary | ICD-10-CM

## 2020-04-02 DIAGNOSIS — R946 Abnormal results of thyroid function studies: Secondary | ICD-10-CM

## 2020-04-02 DIAGNOSIS — R61 Generalized hyperhidrosis: Secondary | ICD-10-CM

## 2020-04-02 DIAGNOSIS — F3160 Bipolar disorder, current episode mixed, unspecified: Secondary | ICD-10-CM

## 2020-04-02 DIAGNOSIS — I872 Venous insufficiency (chronic) (peripheral): Secondary | ICD-10-CM | POA: Diagnosis not present

## 2020-04-02 DIAGNOSIS — F419 Anxiety disorder, unspecified: Secondary | ICD-10-CM

## 2020-04-02 DIAGNOSIS — S42122D Displaced fracture of acromial process, left shoulder, subsequent encounter for fracture with routine healing: Secondary | ICD-10-CM | POA: Diagnosis not present

## 2020-04-02 DIAGNOSIS — I129 Hypertensive chronic kidney disease with stage 1 through stage 4 chronic kidney disease, or unspecified chronic kidney disease: Secondary | ICD-10-CM | POA: Diagnosis not present

## 2020-04-02 DIAGNOSIS — D631 Anemia in chronic kidney disease: Secondary | ICD-10-CM | POA: Diagnosis not present

## 2020-04-02 DIAGNOSIS — N1832 Chronic kidney disease, stage 3b: Secondary | ICD-10-CM | POA: Diagnosis not present

## 2020-04-02 DIAGNOSIS — J449 Chronic obstructive pulmonary disease, unspecified: Secondary | ICD-10-CM | POA: Diagnosis not present

## 2020-04-02 DIAGNOSIS — D72829 Elevated white blood cell count, unspecified: Secondary | ICD-10-CM

## 2020-04-02 DIAGNOSIS — J849 Interstitial pulmonary disease, unspecified: Secondary | ICD-10-CM

## 2020-04-02 DIAGNOSIS — E1122 Type 2 diabetes mellitus with diabetic chronic kidney disease: Secondary | ICD-10-CM | POA: Diagnosis not present

## 2020-04-02 DIAGNOSIS — F4321 Adjustment disorder with depressed mood: Secondary | ICD-10-CM

## 2020-04-02 LAB — CBC WITH DIFFERENTIAL/PLATELET
Basophils Absolute: 0.1 10*3/uL (ref 0.0–0.1)
Basophils Relative: 0.6 % (ref 0.0–3.0)
Eosinophils Absolute: 0.6 10*3/uL (ref 0.0–0.7)
Eosinophils Relative: 4.3 % (ref 0.0–5.0)
HCT: 37.9 % (ref 36.0–46.0)
Hemoglobin: 12.5 g/dL (ref 12.0–15.0)
Lymphocytes Relative: 29.5 % (ref 12.0–46.0)
Lymphs Abs: 3.8 10*3/uL (ref 0.7–4.0)
MCHC: 32.9 g/dL (ref 30.0–36.0)
MCV: 88.5 fl (ref 78.0–100.0)
Monocytes Absolute: 1 10*3/uL (ref 0.1–1.0)
Monocytes Relative: 7.7 % (ref 3.0–12.0)
Neutro Abs: 7.5 10*3/uL (ref 1.4–7.7)
Neutrophils Relative %: 57.9 % (ref 43.0–77.0)
Platelets: 195 10*3/uL (ref 150.0–400.0)
RBC: 4.28 Mil/uL (ref 3.87–5.11)
RDW: 15 % (ref 11.5–15.5)
WBC: 12.9 10*3/uL — ABNORMAL HIGH (ref 4.0–10.5)

## 2020-04-02 LAB — VITAMIN D 25 HYDROXY (VIT D DEFICIENCY, FRACTURES): VITD: 56.64 ng/mL (ref 30.00–100.00)

## 2020-04-02 LAB — T4, FREE: Free T4: 1.19 ng/dL (ref 0.60–1.60)

## 2020-04-02 LAB — HEMOGLOBIN A1C: Hgb A1c MFr Bld: 5.9 % (ref 4.6–6.5)

## 2020-04-02 LAB — TSH: TSH: 2.22 u[IU]/mL (ref 0.35–4.50)

## 2020-04-02 LAB — T3, FREE: T3, Free: 5.6 pg/mL — ABNORMAL HIGH (ref 2.3–4.2)

## 2020-04-02 NOTE — Progress Notes (Signed)
Chief Complaint  Patient presents with  . Follow-up    ED f/u, pt wants to discuss sweating spells. PT request A1C check.    ED f/u  1. 03/24/20 had slurred speech and was able to move feet but per pt with conversion d/o unable to move feet when at church sxs now resolved  She thinks she had a TIA though ED denies and thinks was conversion d/o  MRI neg 03/24/20  H/o CAS mild in 2011 2. ILD/NSIP rec repeat CT chest high resolution  3. CKD f/u Dr. Candiss Norse kidney function stable  4. Elevated TSH will repeat  5. She c/o nigh sweats she thinks related to high sugar on steroids   Review of Systems  Constitutional: Negative for weight loss.  HENT: Negative for hearing loss.   Eyes: Negative for blurred vision.  Respiratory: Negative for cough and shortness of breath.   Cardiovascular: Negative for chest pain.  Gastrointestinal: Negative for abdominal pain.  Musculoskeletal: Negative for falls.  Skin: Negative for rash.  Neurological: Negative for headaches.  Psychiatric/Behavioral: Negative for depression.   Past Medical History:  Diagnosis Date  . Anxiety   . Asthma   . Bipolar disorder ([place])   . C. difficile diarrhea    from Abx 2010 hospitalized   . Cataract    s/p b/l repair   . Chicken pox   . CKD (chronic kidney disease)   . CKD (chronic kidney disease), stage III ([place])    a. s/p R nephrectomy./ aneurysm  . Conversion disorder   . COPD (chronic obstructive pulmonary disease) ([place])   . Depression   . Essential hypertension   . GERD (gastroesophageal reflux disease)   . Hyperlipidemia   . ILD (interstitial lung disease) ([place])    mild; 2/2 RA diagnosis  . Inflammatory arthritis    a. hands/carpal tunnel.  b. Low titer rheumatoid factor. c. Negative anti-CCP antibodies. d. Plaquenil.  . Nocturnal hypoxemia   . Non-Obstructive CAD    a. 07/2009 Cath ([facility]): nonobs dzs;  b. 03/2011 Cath [facility]): nonobs dzs.  . Osteoarthritis    a. Knees.  Marland Kitchen PAD (peripheral artery disease)  ()   . PUD (peptic ulcer disease)   . S/P right hip fracture    11/01/16 s/p repair  . Shoulder pain   . Sleep apnea    no cpap / minimal  . Spinal stenosis at L4-L5 level    severe with L4/L5 anterolisthesis grade 1 anterolisthesis   . Toxic maculopathy   . Valvular heart disease    a. 07/2015 Echo: EF 55-60%, Mild AI, AS, MR, and TR.   Past Surgical History:  Procedure Laterality Date  . APPENDECTOMY    . BACK SURGERY     lumbar fusion  . BUNIONECTOMY Right   . CATARACT EXTRACTION, BILATERAL    . CESAREAN SECTION     x1  . CHOLECYSTECTOMY N/A 05/11/2016   Procedure: LAPAROSCOPIC CHOLECYSTECTOMY;  Surgeon: Florene Glen, MD;  Location: [facility] ORS;  Service: General;  Laterality: N/A;  . COLONOSCOPY WITH PROPOFOL N/A 04/02/2016   Procedure: COLONOSCOPY WITH PROPOFOL;  Surgeon: Jonathon Bellows, MD;  Location: [facility] END[facility]COPY;  Service: Endoscopy;  Laterality: N/A;  . ENDOSCOPIC RETROGRADE CHOLANGIOPANCREATOGRAPHY (ERCP) WITH PROPOFOL N/A 05/08/2016   Procedure: ENDOSCOPIC RETROGRADE CHOLANGIOPANCREATOGRAPHY (ERCP) WITH PROPOFOL;  Surgeon: Lucilla Lame, MD;  Location: [facility] ENDOSCOPY;  Service: Endoscopy;  Laterality: N/A;  . ERCP     with biliary spincterotomy 05/08/16 Dr. Allen Norris for choledocholithiasis   . ESOPHAGEAL  DILATION  04/02/2016   Procedure: ESOPHAGEAL DILATION;  Surgeon: Jonathon Bellows, MD;  Location: [facility];  Service: Endoscopy;;  . ESOPHAGOGASTRODUODENOSCOPY (EGD) WITH PROPOFOL N/A 04/02/2016   Procedure: ESOPHAGOGASTRODUODENOSCOPY (EGD) WITH PROPOFOL;  Surgeon: Jonathon Bellows, MD;  Location: [facility] END[facility];  Service: Endoscopy;  Laterality: N/A;  . HIP ARTHROPLASTY Right 11/01/2016   Procedure: ARTHROPLASTY BIPOLAR HIP (HEMIARTHROPLASTY);  Surgeon: Corky Mull, MD;  Location: [facility] ORS;  Service: Orthopedics;  Laterality: Right;  . 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 ORIF SCAPHOID FRACTURE Left 12/21/2019   Procedure: OPEN REDUCTION  INTERNAL FIXATION (ORIF) OF LEFT SCAPULAR NONUNION WITH BONE GRAFT;  Surgeon: Corky Mull, MD;  Location: [facility] ORS;  Service: Orthopedics;  Laterality: Left;  . osteoporosis     noted DEXA 08/19/16   . REPLACEMENT TOTAL KNEE Right   . REVERSE SHOULDER ARTHROPLASTY Right 11/04/2017   Procedure: REVERSE SHOULDER ARTHROPLASTY;  Surgeon: Corky Mull, MD;  Location: [facility];  Service: Orthopedics;  Laterality: Right;  . REVERSE SHOULDER ARTHROPLASTY Left 07/26/2018   Procedure: REVERSE SHOULDER ARTHROPLASTY;  Surgeon: Corky Mull, MD;  Location: [facility];  Service: Orthopedics;  Laterality: Left;  . TONSILLECTOMY    . TOTAL HIP ARTHROPLASTY  12/10/11   [facility] left hip  . TOTAL HIP ARTHROPLASTY Bilateral   . TUBAL LIGATION     Family History  Problem Relation Age of Onset  . Rheum arthritis Mother   . Asthma Mother   . Parkinson's disease Mother   . Heart disease Mother   . Stroke Mother   . Hypertension Mother   . Heart attack Father   . Heart disease Father   . Hypertension Father   . Peripheral Artery Disease Father   . Diabetes Son   . Gout Son   . Asthma Sister   . Heart disease Sister   . Lung cancer Sister   . Heart disease Sister   . Heart disease Sister   . Breast cancer Sister   . Heart attack Sister   . Heart disease Brother   . Heart disease Maternal Grandmother   . Diabetes Maternal Grandmother   . Colon cancer Maternal Grandmother   . Cancer Maternal Grandmother        Hodgkins lymphoma  . Heart disease Brother   . Alcohol abuse Brother   . Depression Brother   . Dementia Son    Social History   Socioeconomic History  . Marital status: Widowed    Spouse name: richard  . Number of children: 2  . Years of education: Some Coll  . Highest education level: Some college, no degree  Occupational History  . Occupation: Retired    Comment: retired  Tobacco Use  . Smoking status: Former Smoker    Packs/day: 0.50    Years: 20.00    Pack years: 10.00     Types: Cigarettes    Quit date: 02/02/1974    Years since quitting: 46.1  . Smokeless tobacco: Never Used  Vaping Use  . Vaping Use: Never used  Substance and Sexual Activity  . Alcohol use: No  . Drug use: No  . Sexual activity: Not Currently  Other Topics Concern  . Not on file  Social History Narrative   Lives at home in [place] grandson, his wife and their child.   Husband Jetaun Colbath died covid 73 late 1/2085married x 81 years   Right-handed.   6 cups  coffee per day.   Social Determinants of Health   Financial Resource Strain: Low Risk   . Difficulty of Paying Living Expenses: Not hard at all  Food Insecurity: No Food Insecurity  . Worried About Charity fundraiser in the Last Year: Never true  . Ran Out of Food in the Last Year: Never true  Transportation Needs: No Transportation Needs  . Lack of Transportation (Medical): No  . Lack of Transportation (Non-Medical): No  Physical Activity: Inactive  . Days of Exercise per Week: 0 days  . Minutes of Exercise per Session: 0 min  Stress: No Stress Concern Present  . Feeling of Stress : Not at all  Social Connections: Not on file  Intimate Partner Violence: Not on file   Current Meds  Medication Sig  . Adalimumab 40 MG/0.4ML PNKT Inject 40 mg into the skin every 14 (fourteen) days.   Marland Kitchen albuterol (VENTOLIN HFA) 108 (90 Base) MCG/ACT inhaler Inhale 2 puffs into the lungs every 6 (six) hours as needed. (Patient taking differently: Inhale 2 puffs into the lungs every 6 (six) hours as needed for wheezing or shortness of breath.)  . ALPRAZolam (XANAX) 0.25 MG tablet Take 1 tablet (0.25 mg total) by mouth at bedtime as needed for anxiety.  Marland Kitchen amLODipine (NORVASC) 2.5 MG tablet Take 2.5 mg by mouth daily.  . benzonatate (TESSALON) 200 MG capsule Take 1 capsule (200 mg total) by mouth 3 (three) times daily as needed for cough.  . budesonide (PULMICORT) 0.25 MG/2ML nebulizer solution USE 1 VIAL  IN  NEBULIZER TWICE  DAILY - rinse  mouth after treatment  . Cholecalciferol 50 MCG (2000 UT) CAPS Take 4,000 Units by mouth daily.   Marland Kitchen escitalopram (LEXAPRO) 10 MG tablet Take 1 tablet (10 mg total) by mouth daily.  Marland Kitchen etodolac (LODINE) 500 MG tablet Take 500 mg by mouth at bedtime.  . famotidine (PEPCID) 20 MG tablet Take 1 tablet (20 mg total) by mouth daily. Before breakfast or dinner  . fluticasone (FLONASE) 50 MCG/ACT nasal spray Place 2 sprays into both nostrils daily. In am  . gabapentin (NEURONTIN) 300 MG capsule Take 2 capsules (600 mg total) by mouth in the morning and at bedtime.  Marland Kitchen ipratropium (ATROVENT HFA) 17 MCG/ACT inhaler Inhale 1 puff into the lungs daily.  Marland Kitchen ipratropium-albuterol (DUONEB) 0.5-2.5 (3) MG/3ML SOLN Take 3 mLs by nebulization 3 (three) times daily as needed. (Patient taking differently: Take 3 mLs by nebulization 3 (three) times daily as needed (shortness of breath).)  . lamoTRIgine (LAMICTAL) 100 MG tablet Take 1 tablet (100 mg total) by mouth 2 (two) times daily.  Marland Kitchen leflunomide (ARAVA) 20 MG tablet Take 1 tablet (20 mg total) by mouth daily.  Marland Kitchen lovastatin (MEVACOR) 20 MG tablet Take 1 tablet (20 mg total) by mouth at bedtime.  . montelukast (SINGULAIR) 10 MG tablet Take 1 tablet (10 mg total) by mouth daily.  Marland Kitchen MYRBETRIQ 50 MG TB24 tablet TAKE 1 TABLET BY MOUTH EVERYDAY AT BEDTIME (Patient taking differently: Take 50 mg by mouth daily.)  . nebivolol (BYSTOLIC) 10 MG tablet Take 0.5 tablets (5 mg total) by mouth in the morning and at bedtime.  . pantoprazole (PROTONIX) 40 MG tablet Take 1 tablet (40 mg total) by mouth 2 (two) times daily. 30 minutes before food. Note reduction in frequency  . predniSONE (DELTASONE) 5 MG tablet TAKE 1.5 TABLETS (7.5 MG TOTAL) BY MOUTH DAILY WITH BREAKFAST.  Marland Kitchen predniSONE (STERAPRED UNI-PAK 21 TAB) 10 MG (21)  TBPK tablet Use as directed.  Marland Kitchen QUEtiapine (SEROQUEL) 25 MG tablet Take 1 tablet (25 mg total) by mouth at bedtime.  . sucralfate (CARAFATE) 1 g tablet Take 1  tablet (1 g total) by mouth 2 (two) times daily.   Allergies  Allergen Reactions  . Ceftin [Cefuroxime Axetil] Anaphylaxis  . Lisinopril Anaphylaxis  . Sulfa Antibiotics Other (See Comments)    Face swelling  . Sulfasalazine Anaphylaxis  . Morphine Other (See Comments)    Per patient, low blood pressure issues that requires action to raise it back up. Can take small infrequent doses  . Xarelto [Rivaroxaban] Other (See Comments)    Stomach burning, bleeding, and tar in stool  . Adhesive [Tape] Rash    Paper tape and tega derm OK  . Antihistamines, Chlorpheniramine-Type Other (See Comments)    Makes pt hyper  . Antivert [Meclizine Hcl] Other (See Comments)    Bladder will not empty  . Aspirin Other (See Comments)    Sulfasalazine allergy cross reacts  . Contrast Media [Iodinated Diagnostic Agents] Rash    she is able to use betadine scrubs.  [place] [Pseudoephedrine Hcl] Other (See Comments)    Makes pt hyper  . Doxycycline Other (See Comments)    GI upset  . Levaquin [Levofloxacin In D5w] Rash  . Polymyxin B Other (See Comments)    Facial rash  . Tetanus Toxoids Rash and Other (See Comments)    Fever and hot to touch at injection site   Recent Results (from the past 2160 hour(s))  CBG monitoring, ED     Status: Abnormal   Collection Time: 03/24/20  3:00 PM  Result Value Ref Range   Glucose-Capillary 159 (H) 70 - 99 mg/dL    Comment: Glucose reference range applies only to samples taken after fasting for at least 8 hours.  Protime-INR     Status: None   Collection Time: 03/24/20  3:17 PM  Result Value Ref Range   Prothrombin Time 12.5 11.4 - 15.2 seconds   INR 1.0 0.8 - 1.2    Comment: (NOTE) INR goal varies based on device and disease states. Performed at [facility], [address] [place], [place] [address]   APTT     Status: None   Collection Time: 03/24/20  3:17 PM  Result Value Ref Range   aPTT 28 24 - 36 seconds    Comment: Performed at  [facility], [address] [place], [place] [address]  CBC     Status: None   Collection Time: 03/24/20  3:17 PM  Result Value Ref Range   WBC 9.9 4.0 - 10.5 K/uL   RBC 4.33 3.87 - 5.11 MIL/uL   Hemoglobin 12.5 12.0 - 15.0 g/dL   HCT 39.4 36.0 - 46.0 %   MCV 91.0 80.0 - 100.0 fL   MCH 28.9 26.0 - 34.0 pg   MCHC 31.7 30.0 - 36.0 g/dL   RDW 14.3 11.5 - 15.5 %   Platelets 219 150 - 400 K/uL   nRBC 0.0 0.0 - 0.2 %    Comment: Performed at [facility], [address], [place], [place] [address]  Differential     Status: Abnormal   Collection Time: 03/24/20  3:17 PM  Result Value Ref Range   Neutrophils Relative % 80 %   Neutro Abs 8.0 (H) 1.7 - 7.7 K/uL   Lymphocytes Relative 16 %   Lymphs Abs 1.6 0.7 - 4.0 K/uL   Monocytes Relative  1 %   Monocytes Absolute 0.1 0.1 - 1.0 K/uL   Eosinophils Relative 0 %   Eosinophils Absolute 0.0 0.0 - 0.5 K/uL   Basophils Relative 1 %   Basophils Absolute 0.1 0.0 - 0.1 K/uL   Immature Granulocytes 2 %   Abs Immature Granulocytes 0.16 (H) 0.00 - 0.07 K/uL    Comment: Performed at [facility], [address], [place], [place] [address]  Comprehensive metabolic panel     Status: Abnormal   Collection Time: 03/24/20  3:17 PM  Result Value Ref Range   Sodium 138 135 - 145 mmol/L   Potassium 4.6 3.5 - 5.1 mmol/L   Chloride 103 98 - 111 mmol/L   CO2 23 22 - 32 mmol/L   Glucose, Bld 170 (H) 70 - 99 mg/dL    Comment: Glucose reference range applies only to samples taken after fasting for at least 8 hours.   BUN 22 8 - 23 mg/dL   Creatinine, Ser 1.52 (H) 0.44 - 1.00 mg/dL   Calcium 8.5 (L) 8.9 - 10.3 mg/dL   Total Protein 7.3 6.5 - 8.1 g/dL   Albumin 3.7 3.5 - 5.0 g/dL   AST 25 15 - 41 U/L   ALT 16 0 - 44 U/L   Alkaline Phosphatase 61 38 - 126 U/L   Total Bilirubin 0.8 0.3 - 1.2 mg/dL   GFR, Estimated 35 (L) >60 mL/min    Comment: (NOTE) Calculated using the CKD-EPI Creatinine Equation (2021)    Anion gap 12 5  - 15    Comment: Performed at [facility], [place], [place] [address]  Troponin I (High Sensitivity)     Status: Abnormal   Collection Time: 03/24/20  3:21 PM  Result Value Ref Range   Troponin I (High Sensitivity) 39 (H) <18 ng/L    Comment: (NOTE) Elevated high sensitivity troponin I (hsTnI) values and significant  changes across serial measurements may suggest ACS but many other  chronic and acute conditions are known to elevate hsTnI results.  Refer to the "Links" section for chest pain algorithms and additional  guidance. Performed at [facility], [address] [place], [place] [address]   Urinalysis, Complete w Microscopic     Status: Abnormal   Collection Time: 03/24/20  3:21 PM  Result Value Ref Range   Color, Urine YELLOW (A) YELLOW   APPearance CLEAR (A) CLEAR   Specific Gravity, Urine 1.005 1.005 - 1.030   pH 7.0 5.0 - 8.0   Glucose, UA NEGATIVE NEGATIVE mg/dL   Hgb urine dipstick NEGATIVE NEGATIVE   Bilirubin Urine NEGATIVE NEGATIVE   Ketones, ur NEGATIVE NEGATIVE mg/dL   Protein, ur NEGATIVE NEGATIVE mg/dL   Nitrite NEGATIVE NEGATIVE   Leukocytes,Ua NEGATIVE NEGATIVE   WBC, UA NONE SEEN 0 - 5 WBC/hpf   Bacteria, UA NONE SEEN NONE SEEN   Squamous Epithelial / LPF 0-5 0 - 5    Comment: Performed at [facility], [address] [place], [place] [address]   Objective  Body mass index is 29.52 kg/m. Wt Readings from Last 3 Encounters:  04/02/20 180 lb 2 oz (81.7 kg)  12/21/19 166 lb 0.1 oz (75.3 kg)  12/19/19 166 lb (75.3 kg)   Temp Readings from Last 3 Encounters:  04/02/20 (!) 97.3 F (36.3 C)  12/26/19 99 F (37.2 C) (Oral)  12/19/19 97.7 F (36.5 C) (Temporal)   BP Readings from Last 3 Encounters:  04/02/20 128/64  03/24/20  128/63  12/26/19 (!) 146/66   Pulse Readings from Last 3 Encounters:  04/02/20 72  03/24/20 65  12/26/19 79    Physical Exam Vitals and nursing note reviewed.   Constitutional:      Appearance: Normal appearance. She is well-developed and well-groomed.  HENT:     Head: Normocephalic and atraumatic.  Eyes:     Conjunctiva/sclera: Conjunctivae normal.     Pupils: Pupils are equal, round, and reactive to light.  Cardiovascular:     Rate and Rhythm: Normal rate and regular rhythm.     Heart sounds: Normal heart sounds.  Pulmonary:     Effort: Pulmonary effort is normal.     Breath sounds: Normal breath sounds.  Skin:    General: Skin is warm and dry.  Neurological:     General: No focal deficit present.     Mental Status: She is alert and oriented to person, place, and time. Mental status is at baseline.     Gait: Gait normal.  Psychiatric:        Attention and Perception: Attention and perception normal.        Mood and Affect: Mood and affect normal.        Speech: Speech normal.        Behavior: Behavior normal. Behavior is cooperative.        Thought Content: Thought content normal.        Cognition and Memory: Cognition and memory normal.        Judgment: Judgment normal.     Assessment  Plan  TIA (transient ischemic attack) - Plan: US Carotid Duplex Bilateral, Ambulatory referral to Neurology Dr. Manuella Ghazi Bilateral carotid artery stenosis - Plan: US Carotid Duplex Bilateral, Ambulatory referral to Neurology Slurred speech - Plan: Ambulatory referral to Neurology MRI negative 03/24/20   Prediabetes - Plan: Hemoglobin A1c  Leukocytosis, unspecified type - Plan: CBC with Differential/Platelet, Pathologist smear review, CANCELED: Pathologist smear review  Thyroid function test abnormal - Plan: TSH, T4, free, Thyroid peroxidase antibody, T3, free  Vitamin D deficiency - Plan: Vitamin D (25 hydroxy)  ILD (interstitial lung disease) ([place]) - Plan: CT Chest High Resolution NSIP (nonspecific interstitial pneumonia) ([place]) likely related to RA - Plan: CT Chest High Resolution Interstitial pulmonary disease ([place]) - Plan: CT Chest High  Resolution  Night sweats r/o internal etiology  rec get mammogram CT chest  Labs reviewed 03/24/20 repeat TSH  Could be related elevated sugars r/o internal cause HM utd Had flu 2021, prevnar, pna 23utd allergic to Tdap declines shingrix covid 19 vaccine has not haddue to allergies  mammo3/24/2020 repeat normalreferred againwill need to be scheduleas of 12/06/19 wants to wait due to left arm surgery  Colonoscopy 04/02/16 multiple polyps but tortuous colon GI does not want to do any further   Out of age window pap   DEXA 08/19/16 +osteoporosis on Forteo will need to repeat 1 year after Forteo use to see improvement [facility] [place]. -DEXA 10/12/17 with T score -4.1 worse on forteounable to do any further bone densitiesper pt 07/07/2018 due to h/o multiple ortho surgeries +Osteoporosis  Off forteo was on x 2 years F/uKC rheumatology Dr. Jairo Ben upcoming 2021  [place] [facility] to get closer to homeand due for f/u 05/2019 will encourage pt to call To schedule appt Dr. Kellie Moor 11/2019  Dr. Audry Riles specialist 08/2017, wasscheduled 12/2018, 04/24/19, 05/22/19 injection 09/2019 Q 8 weeks  Consider h/o in future with persistent leukocytosis  -refer at f/ulast  CBC WBC normal 07/2019 care everywhere  Echo 11/22/18 [facility] INTERPRETATION   NORMAL LEFT VENTRICULAR SYSTOLIC FUNCTION WITH MILD LVH   NORMAL RIGHT VENTRICULAR SYSTOLIC FUNCTION   VALVULAR REGURGITATION: MODERATE AR, MODERATE MR,TRIVIAL TR   NO VALVULAR STENOSIS   repeat ech 10/06/19 echo [facility]   NORMAL LEFT VENTRICULAR SYSTOLIC FUNCTION   NORMAL RIGHT VENTRICULAR SYSTOLIC FUNCTION   VALVULAR REGURGITATION: MILD AR, MILD MR, TRIVIAL TR   NO VALVULAR STENOSIS   AORTIC SCLEROSIS   LIMITED ACOUSTIC WINDOWS    Provider: Dr. Olivia Mackie McLean-Scocuzza-Internal Medicine

## 2020-04-02 NOTE — Patient Instructions (Addendum)
CT chest high resolution  Call and schedule mammogram when you can please  Ultrasound of your carotid arteries  Refer to Dr. Manuella Ghazi Neurology    Ref. Range 03/24/2020 15:21  Appearance Latest Ref Range: CLEAR  CLEAR (A)  Bilirubin Urine Latest Ref Range: NEGATIVE  NEGATIVE  Color, Urine Latest Ref Range: YELLOW  YELLOW (A)  Glucose, UA Latest Ref Range: NEGATIVE mg/dL NEGATIVE  Hgb urine dipstick Latest Ref Range: NEGATIVE  NEGATIVE  Ketones, ur Latest Ref Range: NEGATIVE mg/dL NEGATIVE  Leukocytes,Ua Latest Ref Range: NEGATIVE  NEGATIVE  Nitrite Latest Ref Range: NEGATIVE  NEGATIVE  pH Latest Ref Range: 5.0 - 8.0  7.0  Protein Latest Ref Range: NEGATIVE mg/dL NEGATIVE  Specific Gravity, Urine Latest Ref Range: 1.005 - 1.030  1.005  Bacteria, UA Latest Ref Range: NONE SEEN  NONE SEEN  Squamous Epithelial / LPF Latest Ref Range: 0 - 5  0-5  WBC, UA Latest Ref Range: 0 - 5 WBC/hpf NONE SEEN

## 2020-04-03 LAB — PATHOLOGIST SMEAR REVIEW

## 2020-04-03 LAB — THYROID PEROXIDASE ANTIBODY: Thyroperoxidase Ab SerPl-aCnc: 4 IU/mL (ref <9)

## 2020-04-03 IMAGING — US US PELVIS COMPLETE WITH TRANSVAGINAL
1 series · 13 of 25 positions shown · non-contrast
Comparison: 04/01/2016.  CT pelvis 02/21/2019.

CLINICAL DATA: Left adnexal mass.



[Series 1: us pelvis complete with transvaginal · 0.20mm/px · 93 acquisitions, 13 frames shown]
[im 1/93]
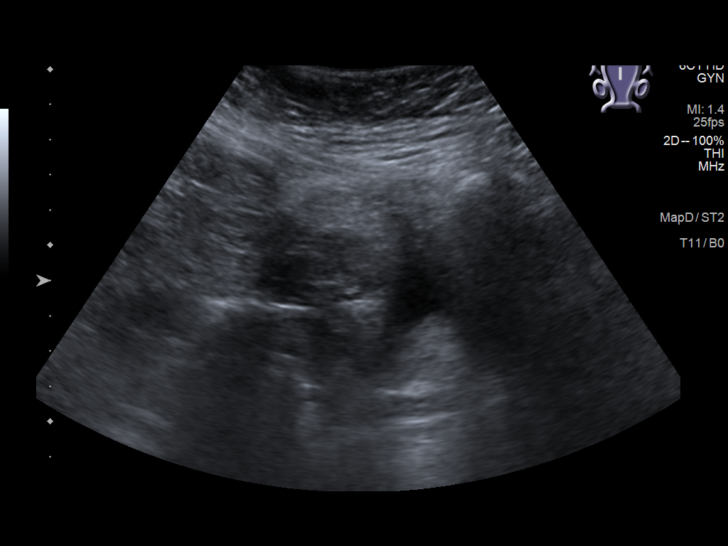
[im 8/93]
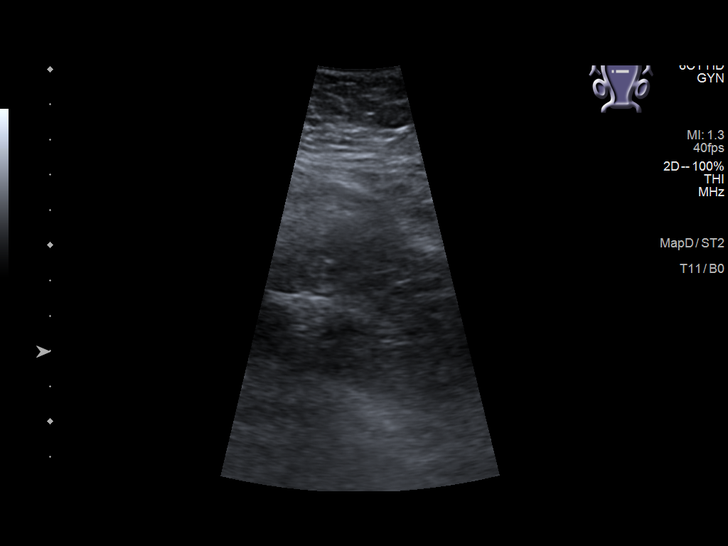
[im 16/93]
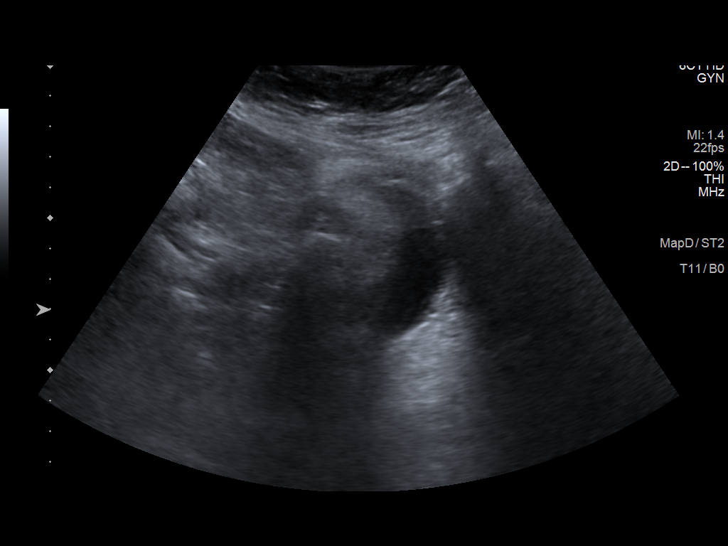
[im 24/93]
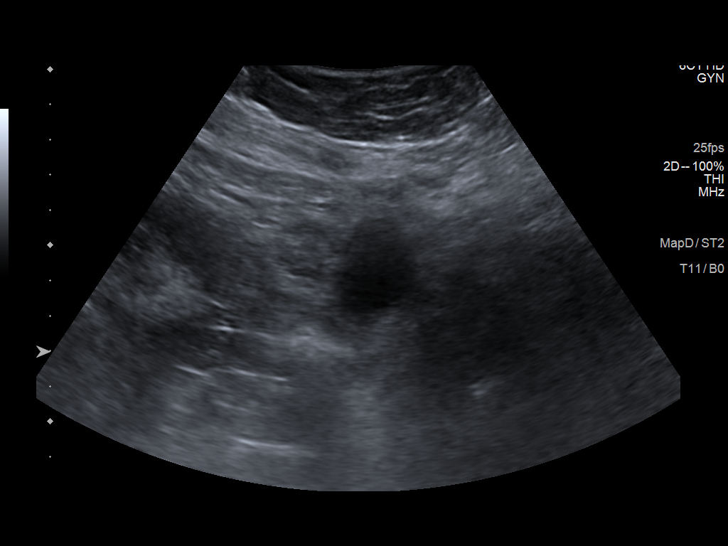
[im 31/93]
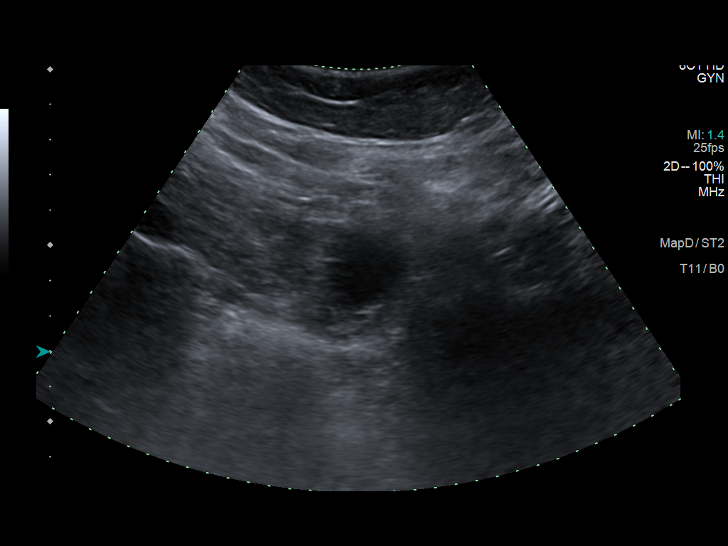
[im 39/93]
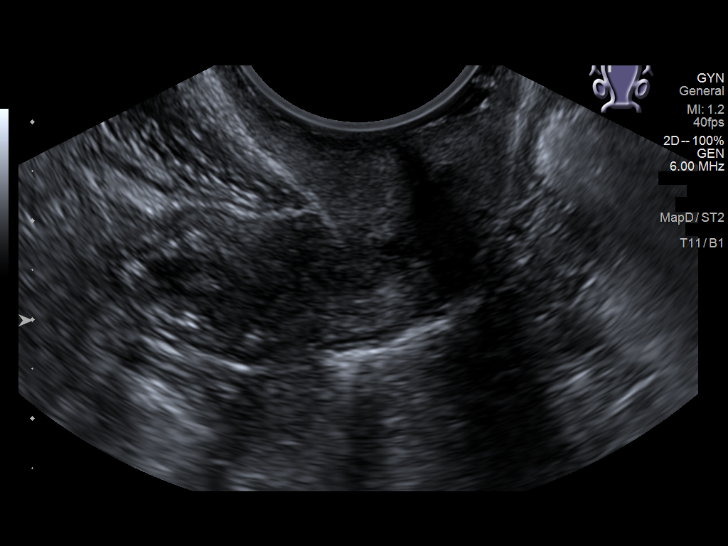
[im 47/93]
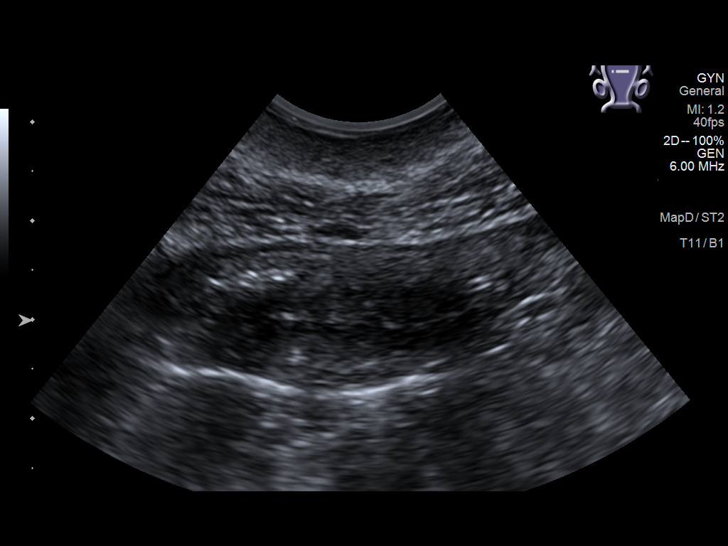
[im 54/93]
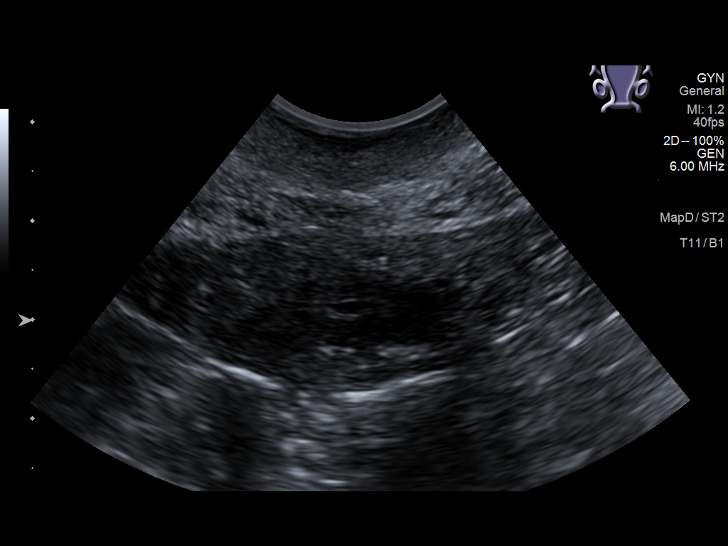
[im 62/93]
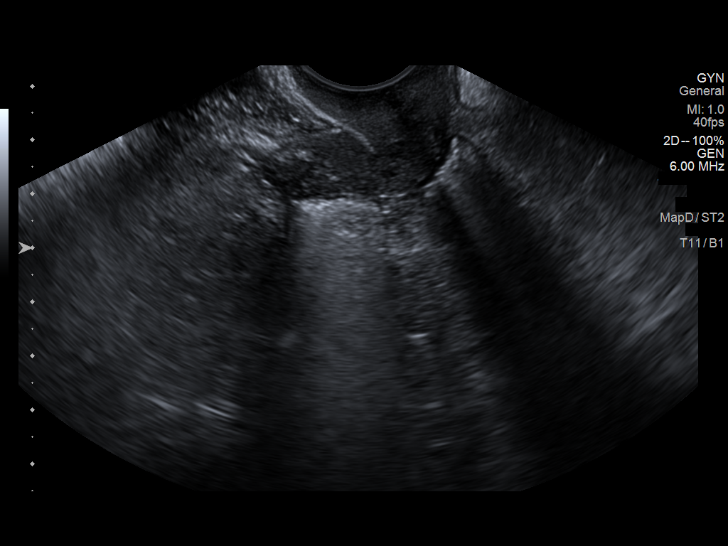
[im 70/93]
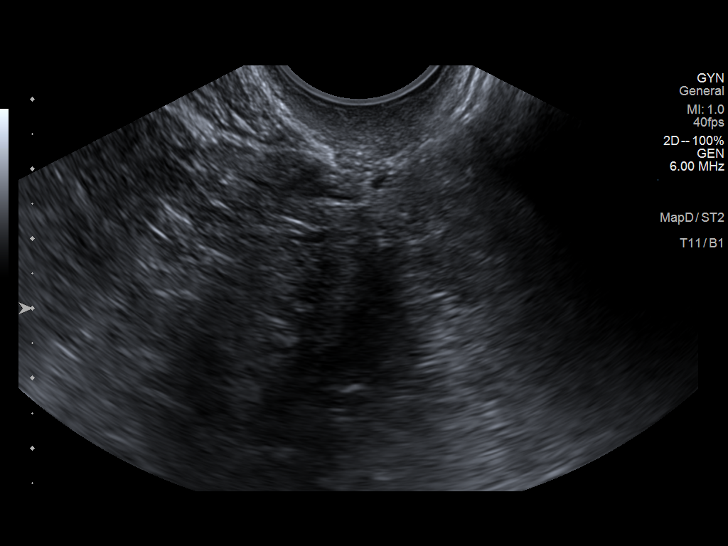
[im 77/93]
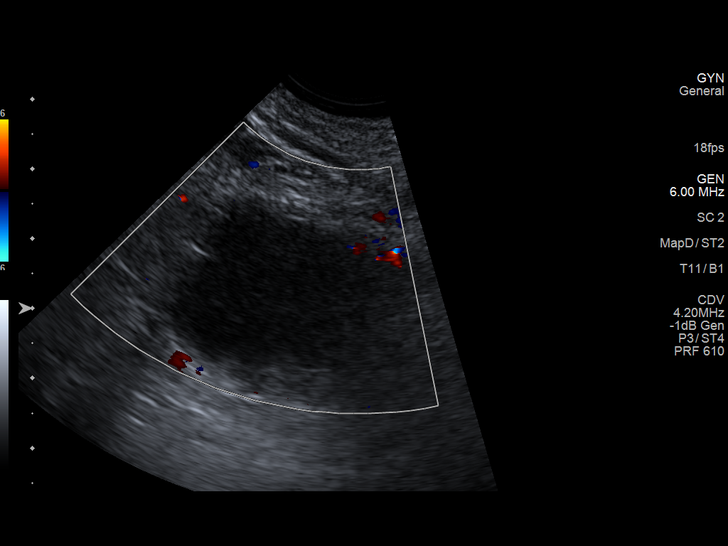
[im 85/93]
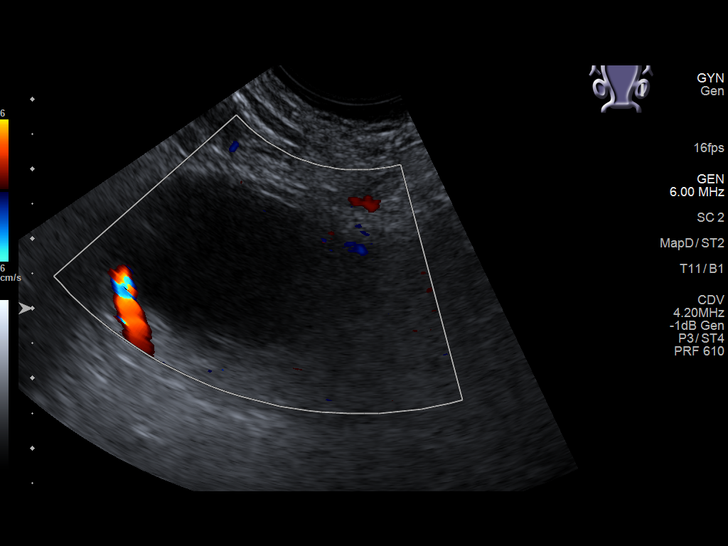
[im 93/93]
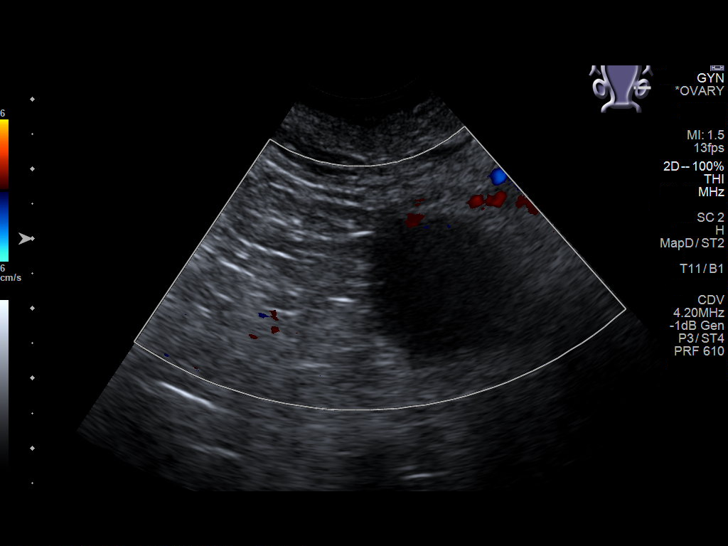

[13 of 25 positions shown; findings below may reference images not displayed]

FINDINGS: Uterus

Measurements: 3.9 x 1.3 x 2.9 cm = volume: 8.1 mL. No fibroids or
other mass visualized.

Endometrium

Thickness: 2 mm in thickness.  No focal abnormality visualized.

Right ovary

Measurements: Not visualized due to overlying bowel gas. No adnexal
mass seen.

Left ovary

Measurements: 3.5 x 1.8 x 2.2 cm = volume: 7.2 mL. Cystic area
within the left ovary measures 2.9 x 2.9 x 2.8 cm. This appears
simple with thin walls, no internal blood flow or mural nodularity.

Other findings

No abnormal free fluid.
IMPRESSION: Simple appearing cystic structure within the left ovary measuring up
to 2.9 cm. While this has enlarged since prior ultrasound, this has
benign characteristics. Continued follow-up with ultrasound could be
performed to ensure stability.

## 2020-04-04 ENCOUNTER — Telehealth: Payer: Self-pay | Admitting: Internal Medicine

## 2020-04-04 DIAGNOSIS — I2510 Atherosclerotic heart disease of native coronary artery without angina pectoris: Secondary | ICD-10-CM | POA: Diagnosis not present

## 2020-04-04 DIAGNOSIS — I340 Nonrheumatic mitral (valve) insufficiency: Secondary | ICD-10-CM | POA: Diagnosis not present

## 2020-04-04 DIAGNOSIS — I351 Nonrheumatic aortic (valve) insufficiency: Secondary | ICD-10-CM | POA: Diagnosis not present

## 2020-04-04 DIAGNOSIS — I10 Essential (primary) hypertension: Secondary | ICD-10-CM | POA: Diagnosis not present

## 2020-04-04 NOTE — Telephone Encounter (Signed)
Pt granddaughter called and would like a call back about her appt from yesterday

## 2020-04-04 NOTE — Telephone Encounter (Signed)
Spoke with the granddaughter and she states the Patient has been having more episodes since her appointment.  Went over what was discussed and ordered at the appointment with the granddaughter. Also reviewed lab results.  She states that the Patient has been having trouble with stuttering and communicating. States the Patient has also been confused, will say one thing and then turn around and state that she did not say that.   Patient has been having leg weakness. States that the Patient's legs will fold under her and she will almost fall. This has happened a few times ain the last 2 days but each time someone caught the Patient. Patient has been dizzy and her blood pressure was low at her other doctor appointment.   Spoke with Dr Olivia Mackie McLean-Scocuzza and she states they should try to see Neurology ASAP. Referral was placed 04/02/20. Gave the number to call to the Granddaughter and she states she will reach out for an urgent appointment.

## 2020-04-04 NOTE — Addendum Note (Signed)
Addended by: Orland Mustard on: 04/04/2020 03:27 PM   Modules accepted: Orders

## 2020-04-05 ENCOUNTER — Telehealth: Payer: Self-pay | Admitting: Internal Medicine

## 2020-04-05 NOTE — Telephone Encounter (Signed)
lft vm for pt to call ofc to sch CT. Thanks

## 2020-04-09 ENCOUNTER — Telehealth: Payer: Medicare PPO | Admitting: Psychiatry

## 2020-04-10 ENCOUNTER — Ambulatory Visit: Payer: Medicare PPO | Admitting: Internal Medicine

## 2020-04-10 DIAGNOSIS — N1832 Chronic kidney disease, stage 3b: Secondary | ICD-10-CM | POA: Diagnosis not present

## 2020-04-10 DIAGNOSIS — I872 Venous insufficiency (chronic) (peripheral): Secondary | ICD-10-CM | POA: Diagnosis not present

## 2020-04-10 DIAGNOSIS — S42122D Displaced fracture of acromial process, left shoulder, subsequent encounter for fracture with routine healing: Secondary | ICD-10-CM | POA: Diagnosis not present

## 2020-04-10 DIAGNOSIS — E1122 Type 2 diabetes mellitus with diabetic chronic kidney disease: Secondary | ICD-10-CM | POA: Diagnosis not present

## 2020-04-10 DIAGNOSIS — I129 Hypertensive chronic kidney disease with stage 1 through stage 4 chronic kidney disease, or unspecified chronic kidney disease: Secondary | ICD-10-CM | POA: Diagnosis not present

## 2020-04-10 DIAGNOSIS — J449 Chronic obstructive pulmonary disease, unspecified: Secondary | ICD-10-CM | POA: Diagnosis not present

## 2020-04-10 DIAGNOSIS — D631 Anemia in chronic kidney disease: Secondary | ICD-10-CM | POA: Diagnosis not present

## 2020-04-10 DIAGNOSIS — E1142 Type 2 diabetes mellitus with diabetic polyneuropathy: Secondary | ICD-10-CM | POA: Diagnosis not present

## 2020-04-10 DIAGNOSIS — E1151 Type 2 diabetes mellitus with diabetic peripheral angiopathy without gangrene: Secondary | ICD-10-CM | POA: Diagnosis not present

## 2020-04-11 DIAGNOSIS — C44329 Squamous cell carcinoma of skin of other parts of face: Secondary | ICD-10-CM | POA: Diagnosis not present

## 2020-04-11 DIAGNOSIS — D2339 Other benign neoplasm of skin of other parts of face: Secondary | ICD-10-CM | POA: Diagnosis not present

## 2020-04-12 NOTE — Addendum Note (Signed)
Addended by: Orland Mustard on: 04/12/2020 05:07 PM   Modules accepted: Orders

## 2020-04-12 NOTE — Addendum Note (Signed)
Addended by: Orland Mustard on: 04/12/2020 05:05 PM   Modules accepted: Orders

## 2020-04-16 DIAGNOSIS — E1142 Type 2 diabetes mellitus with diabetic polyneuropathy: Secondary | ICD-10-CM | POA: Diagnosis not present

## 2020-04-16 DIAGNOSIS — E1122 Type 2 diabetes mellitus with diabetic chronic kidney disease: Secondary | ICD-10-CM | POA: Diagnosis not present

## 2020-04-16 DIAGNOSIS — D631 Anemia in chronic kidney disease: Secondary | ICD-10-CM | POA: Diagnosis not present

## 2020-04-16 DIAGNOSIS — I129 Hypertensive chronic kidney disease with stage 1 through stage 4 chronic kidney disease, or unspecified chronic kidney disease: Secondary | ICD-10-CM | POA: Diagnosis not present

## 2020-04-16 DIAGNOSIS — J449 Chronic obstructive pulmonary disease, unspecified: Secondary | ICD-10-CM | POA: Diagnosis not present

## 2020-04-16 DIAGNOSIS — I872 Venous insufficiency (chronic) (peripheral): Secondary | ICD-10-CM | POA: Diagnosis not present

## 2020-04-16 DIAGNOSIS — S42122D Displaced fracture of acromial process, left shoulder, subsequent encounter for fracture with routine healing: Secondary | ICD-10-CM | POA: Diagnosis not present

## 2020-04-16 DIAGNOSIS — E1151 Type 2 diabetes mellitus with diabetic peripheral angiopathy without gangrene: Secondary | ICD-10-CM | POA: Diagnosis not present

## 2020-04-16 DIAGNOSIS — N1832 Chronic kidney disease, stage 3b: Secondary | ICD-10-CM | POA: Diagnosis not present

## 2020-04-18 DIAGNOSIS — E1122 Type 2 diabetes mellitus with diabetic chronic kidney disease: Secondary | ICD-10-CM | POA: Diagnosis not present

## 2020-04-18 DIAGNOSIS — S42122D Displaced fracture of acromial process, left shoulder, subsequent encounter for fracture with routine healing: Secondary | ICD-10-CM | POA: Diagnosis not present

## 2020-04-18 DIAGNOSIS — M545 Low back pain, unspecified: Secondary | ICD-10-CM | POA: Diagnosis not present

## 2020-04-18 DIAGNOSIS — E1142 Type 2 diabetes mellitus with diabetic polyneuropathy: Secondary | ICD-10-CM | POA: Diagnosis not present

## 2020-04-18 DIAGNOSIS — N1832 Chronic kidney disease, stage 3b: Secondary | ICD-10-CM | POA: Diagnosis not present

## 2020-04-18 DIAGNOSIS — E1151 Type 2 diabetes mellitus with diabetic peripheral angiopathy without gangrene: Secondary | ICD-10-CM | POA: Diagnosis not present

## 2020-04-18 DIAGNOSIS — D631 Anemia in chronic kidney disease: Secondary | ICD-10-CM | POA: Diagnosis not present

## 2020-04-18 DIAGNOSIS — J449 Chronic obstructive pulmonary disease, unspecified: Secondary | ICD-10-CM | POA: Diagnosis not present

## 2020-04-18 DIAGNOSIS — I872 Venous insufficiency (chronic) (peripheral): Secondary | ICD-10-CM | POA: Diagnosis not present

## 2020-04-18 DIAGNOSIS — G8929 Other chronic pain: Secondary | ICD-10-CM | POA: Diagnosis not present

## 2020-04-18 DIAGNOSIS — I129 Hypertensive chronic kidney disease with stage 1 through stage 4 chronic kidney disease, or unspecified chronic kidney disease: Secondary | ICD-10-CM | POA: Diagnosis not present

## 2020-04-23 ENCOUNTER — Other Ambulatory Visit: Payer: Medicare PPO

## 2020-04-23 ENCOUNTER — Ambulatory Visit: Payer: Medicare PPO

## 2020-04-25 ENCOUNTER — Inpatient Hospital Stay: Payer: Medicare PPO

## 2020-04-25 ENCOUNTER — Inpatient Hospital Stay: Payer: Medicare PPO | Attending: Oncology | Admitting: Oncology

## 2020-04-25 ENCOUNTER — Encounter: Payer: Self-pay | Admitting: Oncology

## 2020-04-25 VITALS — BP 132/106 | HR 74 | Temp 97.1°F | Wt 183.0 lb

## 2020-04-25 DIAGNOSIS — E1142 Type 2 diabetes mellitus with diabetic polyneuropathy: Secondary | ICD-10-CM | POA: Diagnosis not present

## 2020-04-25 DIAGNOSIS — E1122 Type 2 diabetes mellitus with diabetic chronic kidney disease: Secondary | ICD-10-CM | POA: Diagnosis not present

## 2020-04-25 DIAGNOSIS — D631 Anemia in chronic kidney disease: Secondary | ICD-10-CM | POA: Diagnosis not present

## 2020-04-25 DIAGNOSIS — D72829 Elevated white blood cell count, unspecified: Secondary | ICD-10-CM | POA: Insufficient documentation

## 2020-04-25 DIAGNOSIS — Z87891 Personal history of nicotine dependence: Secondary | ICD-10-CM | POA: Diagnosis not present

## 2020-04-25 DIAGNOSIS — I10 Essential (primary) hypertension: Secondary | ICD-10-CM | POA: Insufficient documentation

## 2020-04-25 DIAGNOSIS — Z79899 Other long term (current) drug therapy: Secondary | ICD-10-CM | POA: Insufficient documentation

## 2020-04-25 DIAGNOSIS — I129 Hypertensive chronic kidney disease with stage 1 through stage 4 chronic kidney disease, or unspecified chronic kidney disease: Secondary | ICD-10-CM | POA: Diagnosis not present

## 2020-04-25 DIAGNOSIS — N1832 Chronic kidney disease, stage 3b: Secondary | ICD-10-CM | POA: Diagnosis not present

## 2020-04-25 DIAGNOSIS — E1151 Type 2 diabetes mellitus with diabetic peripheral angiopathy without gangrene: Secondary | ICD-10-CM | POA: Diagnosis not present

## 2020-04-25 DIAGNOSIS — I872 Venous insufficiency (chronic) (peripheral): Secondary | ICD-10-CM | POA: Diagnosis not present

## 2020-04-25 DIAGNOSIS — S42122D Displaced fracture of acromial process, left shoulder, subsequent encounter for fracture with routine healing: Secondary | ICD-10-CM | POA: Diagnosis not present

## 2020-04-25 DIAGNOSIS — J449 Chronic obstructive pulmonary disease, unspecified: Secondary | ICD-10-CM | POA: Diagnosis not present

## 2020-04-25 LAB — CBC WITH DIFFERENTIAL/PLATELET
Abs Immature Granulocytes: 0.25 10*3/uL — ABNORMAL HIGH (ref 0.00–0.07)
Basophils Absolute: 0.1 10*3/uL (ref 0.0–0.1)
Basophils Relative: 1 %
Eosinophils Absolute: 0.7 10*3/uL — ABNORMAL HIGH (ref 0.0–0.5)
Eosinophils Relative: 5 %
HCT: 38.9 % (ref 36.0–46.0)
Hemoglobin: 12.2 g/dL (ref 12.0–15.0)
Immature Granulocytes: 2 %
Lymphocytes Relative: 18 %
Lymphs Abs: 2.2 10*3/uL (ref 0.7–4.0)
MCH: 28.7 pg (ref 26.0–34.0)
MCHC: 31.4 g/dL (ref 30.0–36.0)
MCV: 91.5 fL (ref 80.0–100.0)
Monocytes Absolute: 0.9 10*3/uL (ref 0.1–1.0)
Monocytes Relative: 7 %
Neutro Abs: 8.4 10*3/uL — ABNORMAL HIGH (ref 1.7–7.7)
Neutrophils Relative %: 67 %
Platelets: 177 10*3/uL (ref 150–400)
RBC: 4.25 MIL/uL (ref 3.87–5.11)
RDW: 14.6 % (ref 11.5–15.5)
WBC: 12.5 10*3/uL — ABNORMAL HIGH (ref 4.0–10.5)
nRBC: 0 % (ref 0.0–0.2)

## 2020-04-25 LAB — LACTATE DEHYDROGENASE: LDH: 184 U/L (ref 98–192)

## 2020-04-25 LAB — PATHOLOGIST SMEAR REVIEW

## 2020-04-25 NOTE — Progress Notes (Signed)
Hematology/Oncology Consult note [facility] Telephone:([phone] [phone]   Patient Care Team: McLean-Scocuzza, Nino Glow, MD as PCP - General (Internal Medicine)  REFERRING PROVIDER: McLean-Scocuzza, Nino Glow, MD  CHIEF COMPLAINTS/REASON FOR VISIT:  Evaluation of leukocytosis  HISTORY OF PRESENTING ILLNESS:  Kerri Carter is a  80 y.o.  female with PMH listed below who was referred to me for evaluation of leukocytosis Reviewed patient' recent labs obtained by PCP.   04/02/2020 CBC showed elevated white count of 12.9.  She had a blood smear which showed no immature cells.  Absolute eosinophilia, mild population consists predominantly of mature segmented neutrophils with mild left shift and reactive changes.  RBCs appear to be microcytic and hypochromic on smear.  Adequate platelets. Previous lab records reviewed. Leukocytosis onset of chronic, duration is since   No aggravating or elevated factors. Associated symptoms or signs:  Denies weight loss, fever, chills,night sweats.  Fatigue Smoking history: Denies History of recent oral steroid use or steroid injection: Patient is currently on prednisone 7.5 mg daily.  She reports that she has been on prednisone for the past and has been on a tapering course. History of recent infection: Denies Autoimmune disease history.  Patient has Has improved arthritis, on Humira.  Interstitial lung disease Valvular heart disease. CHF   Review of Systems  Constitutional: Positive for fatigue. Negative for appetite change, chills, fever and unexpected weight change.  HENT:   Negative for hearing loss and voice change.   Eyes: Negative for eye problems.  Respiratory: Negative for chest tightness and cough.   Cardiovascular: Positive for leg swelling. Negative for chest pain.  Gastrointestinal: Negative for abdominal distention, abdominal pain and blood in stool.  Endocrine: Negative for hot flashes.  Genitourinary:  Negative for difficulty urinating and frequency.   Musculoskeletal: Positive for arthralgias.  Skin: Negative for itching and rash.  Neurological: Negative for extremity weakness.  Hematological: Negative for adenopathy.  Psychiatric/Behavioral: Negative for confusion.     MEDICAL HISTORY:  Past Medical History:  Diagnosis Date  . Anxiety   . Asthma   . Bipolar disorder ([place])   . C. difficile diarrhea    from Abx 2010 hospitalized   . Cataract    s/p b/l repair   . Chicken pox   . CKD (chronic kidney disease)   . CKD (chronic kidney disease), stage III ([place])    a. s/p R nephrectomy./ aneurysm  . Conversion disorder   . COPD (chronic obstructive pulmonary disease) ([place])   . Depression   . Essential hypertension   . GERD (gastroesophageal reflux disease)   . Hyperlipidemia   . ILD (interstitial lung disease) ([place])    mild; 2/2 RA diagnosis  . Inflammatory arthritis    a. hands/carpal tunnel.  b. Low titer rheumatoid factor. c. Negative anti-CCP antibodies. d. Plaquenil.  . Nocturnal hypoxemia   . Non-Obstructive CAD    a. 07/2009 Cath ([facility]): nonobs dzs;  b. 03/2011 Cath [facility]): nonobs dzs.  . Osteoarthritis    a. Knees.  Marland Kitchen PAD (peripheral artery disease) ([place])   . PUD (peptic ulcer disease)   . S/P right hip fracture    11/01/16 s/p repair  . Shoulder pain   . Sleep apnea    no cpap / minimal  . Spinal stenosis at L4-L5 level    severe with L4/L5 anterolisthesis grade 1 anterolisthesis   . Toxic maculopathy   . Valvular heart disease    a. 07/2015 Echo: EF 55-60%, Mild AI,  AS, MR, and TR.    SURGICAL HISTORY: Past Surgical History:  Procedure Laterality Date  . APPENDECTOMY    . BACK SURGERY     lumbar fusion  . BUNIONECTOMY Right   . CATARACT EXTRACTION, BILATERAL    . CESAREAN SECTION     x1  . CHOLECYSTECTOMY N/A 05/11/2016   Procedure: LAPAROSCOPIC CHOLECYSTECTOMY;  Surgeon: Florene Glen, MD;  Location: [facility] ORS;  Service: General;  Laterality: N/A;  .  COLONOSCOPY WITH PROPOFOL N/A 04/02/2016   Procedure: COLONOSCOPY WITH PROPOFOL;  Surgeon: Jonathon Bellows, MD;  Location: [facility] ENDOSCOPY;  Service: Endoscopy;  Laterality: N/A;  . ENDOSCOPIC RETROGRADE CHOLANGIOPANCREATOGRAPHY (ERCP) WITH PROPOFOL N/A 05/08/2016   Procedure: ENDOSCOPIC RETROGRADE CHOLANGIOPANCREATOGRAPHY (ERCP) WITH PROPOFOL;  Surgeon: Lucilla Lame, MD;  Location: [facility] ENDOSCOPY;  Service: Endoscopy;  Laterality: N/A;  . ERCP     with biliary spincterotomy 05/08/16 Dr. Allen Norris for choledocholithiasis   . ESOPHAGEAL DILATION  04/02/2016   Procedure: ESOPHAGEAL DILATION;  Surgeon: Jonathon Bellows, MD;  Location: [facility] ENDOSCOPY;  Service: Endoscopy;;  . ESOPHAGOGASTRODUODENOSCOPY (EGD) WITH PROPOFOL N/A 04/02/2016   Procedure: ESOPHAGOGASTRODUODENOSCOPY (EGD) WITH PROPOFOL;  Surgeon: Jonathon Bellows, MD;  Location: [facility] ENDOSCOPY;  Service: Endoscopy;  Laterality: N/A;  . HIP ARTHROPLASTY Right 11/01/2016   Procedure: ARTHROPLASTY BIPOLAR HIP (HEMIARTHROPLASTY);  Surgeon: Corky Mull, MD;  Location: [facility] ORS;  Service: Orthopedics;  Laterality: Right;  . 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 ORIF SCAPHOID FRACTURE Left 12/21/2019   Procedure: OPEN REDUCTION INTERNAL FIXATION (ORIF) OF LEFT SCAPULAR NONUNION WITH BONE GRAFT;  Surgeon: Corky Mull, MD;  Location: [facility] ORS;  Service: Orthopedics;  Laterality: Left;  . osteoporosis     noted DEXA 08/19/16   . REPLACEMENT TOTAL KNEE Right   . REVERSE SHOULDER ARTHROPLASTY Right 11/04/2017   Procedure: REVERSE SHOULDER ARTHROPLASTY;  Surgeon: Corky Mull, MD;  Location: [facility] ORS;  Service: Orthopedics;  Laterality: Right;  . REVERSE SHOULDER ARTHROPLASTY Left 07/26/2018   Procedure: REVERSE SHOULDER ARTHROPLASTY;  Surgeon: Corky Mull, MD;  Location: [facility] ORS;  Service: Orthopedics;  Laterality: Left;  . TONSILLECTOMY    . TOTAL HIP ARTHROPLASTY  12/10/11   [facility] left hip  . TOTAL HIP ARTHROPLASTY Bilateral    . TUBAL LIGATION      SOCIAL HISTORY: Social History   Socioeconomic History  . Marital status: Widowed    Spouse name: richard  . Number of children: 2  . Years of education: Some Coll  . Highest education level: Some college, no degree  Occupational History  . Occupation: Retired    Comment: retired  Tobacco Use  . Smoking status: Former Smoker    Packs/day: 0.50    Years: 20.00    Pack years: 10.00    Types: Cigarettes    Quit date: 02/02/1974    Years since quitting: 46.2  . Smokeless tobacco: Never Used  Vaping Use  . Vaping Use: Never used  Substance and Sexual Activity  . Alcohol use: No  . Drug use: No  . Sexual activity: Not Currently  Other Topics Concern  . Not on file  Social History Narrative   Lives at home in [place] grandson, his wife and their child.   Husband Lafawn Lenoir died covid 38 late 1/205mrried x 555years   Right-handed.   6 cups coffee per day.   Social Determinants of Health   Financial Resource Strain: Low Risk   .  Difficulty of Paying Living Expenses: Not hard at all  Food Insecurity: No Food Insecurity  . Worried About Charity fundraiser in the Last Year: Never true  . Ran Out of Food in the Last Year: Never true  Transportation Needs: No Transportation Needs  . Lack of Transportation (Medical): No  . Lack of Transportation (Non-Medical): No  Physical Activity: Inactive  . Days of Exercise per Week: 0 days  . Minutes of Exercise per Session: 0 min  Stress: No Stress Concern Present  . Feeling of Stress : Not at all  Social Connections: Not on file  Intimate Partner Violence: Not on file    FAMILY HISTORY: Family History  Problem Relation Age of Onset  . Rheum arthritis Mother   . Asthma Mother   . Parkinson's disease Mother   . Heart disease Mother   . Stroke Mother   . Hypertension Mother   . Heart attack Father   . Heart disease Father   . Hypertension Father   . Peripheral Artery Disease Father   .  Diabetes Son   . Gout Son   . Asthma Sister   . Heart disease Sister   . Lung cancer Sister   . Heart disease Sister   . Heart disease Sister   . Breast cancer Sister   . Heart attack Sister   . Heart disease Brother   . Heart disease Maternal Grandmother   . Diabetes Maternal Grandmother   . Colon cancer Maternal Grandmother   . Cancer Maternal Grandmother        Hodgkins lymphoma  . Heart disease Brother   . Alcohol abuse Brother   . Depression Brother   . Dementia Son     ALLERGIES:  is allergic to ceftin [cefuroxime axetil]; lisinopril; sulfa antibiotics; sulfasalazine; morphine; xarelto [rivaroxaban]; adhesive [tape]; antihistamines, chlorpheniramine-type; antivert [meclizine hcl]; aspirin; contrast media [iodinated diagnostic agents]; decongestant [pseudoephedrine hcl]; doxycycline; levaquin [levofloxacin in d5w]; polymyxin b; and tetanus toxoids.  MEDICATIONS:  Current Outpatient Medications  Medication Sig Dispense Refill  . Adalimumab 40 MG/0.4ML PNKT Inject 40 mg into the skin every 14 (fourteen) days.     Marland Kitchen albuterol (VENTOLIN HFA) 108 (90 Base) MCG/ACT inhaler Inhale 2 puffs into the lungs every 6 (six) hours as needed. (Patient taking differently: Inhale 2 puffs into the lungs every 6 (six) hours as needed for wheezing or shortness of breath.) 1 Inhaler 5  . ALPRAZolam (XANAX) 0.25 MG tablet Take 1 tablet (0.25 mg total) by mouth at bedtime as needed for anxiety. 30 tablet 2  . amLODipine (NORVASC) 2.5 MG tablet Take 2.5 mg by mouth daily.    . benzonatate (TESSALON) 200 MG capsule Take 1 capsule (200 mg total) by mouth 3 (three) times daily as needed for cough. 30 capsule 0  . budesonide (PULMICORT) 0.25 MG/2ML nebulizer solution USE 1 VIAL  IN  NEBULIZER TWICE  DAILY - rinse mouth after treatment 60 mL 11  . Cholecalciferol 50 MCG (2000 UT) CAPS Take 4,000 Units by mouth daily.     Marland Kitchen escitalopram (LEXAPRO) 10 MG tablet Take 1 tablet (10 mg total) by mouth daily. 90  tablet 0  . etodolac (LODINE) 500 MG tablet Take 500 mg by mouth at bedtime.    . famotidine (PEPCID) 20 MG tablet Take 1 tablet (20 mg total) by mouth daily. Before breakfast or dinner 90 tablet 3  . fluticasone (FLONASE) 50 MCG/ACT nasal spray Place 2 sprays into both nostrils daily. In am    .  gabapentin (NEURONTIN) 300 MG capsule Take 2 capsules (600 mg total) by mouth in the morning and at bedtime.    Marland Kitchen ipratropium (ATROVENT HFA) 17 MCG/ACT inhaler Inhale 1 puff into the lungs daily.    Marland Kitchen ipratropium-albuterol (DUONEB) 0.5-2.5 (3) MG/3ML SOLN Take 3 mLs by nebulization 3 (three) times daily as needed. (Patient taking differently: Take 3 mLs by nebulization 3 (three) times daily as needed (shortness of breath).) 360 mL 3  . lamoTRIgine (LAMICTAL) 100 MG tablet Take 1 tablet (100 mg total) by mouth 2 (two) times daily. 60 tablet 0  . leflunomide (ARAVA) 20 MG tablet Take 1 tablet (20 mg total) by mouth daily.    Marland Kitchen lovastatin (MEVACOR) 20 MG tablet Take 1 tablet (20 mg total) by mouth at bedtime. 90 tablet 3  . montelukast (SINGULAIR) 10 MG tablet Take 1 tablet (10 mg total) by mouth daily. 90 tablet 3  . MYRBETRIQ 50 MG TB24 tablet TAKE 1 TABLET BY MOUTH EVERYDAY AT BEDTIME (Patient taking differently: Take 50 mg by mouth daily.) 30 tablet 1  . nebivolol (BYSTOLIC) 10 MG tablet Take 0.5 tablets (5 mg total) by mouth in the morning and at bedtime.    . pantoprazole (PROTONIX) 40 MG tablet Take 1 tablet (40 mg total) by mouth 2 (two) times daily. 30 minutes before food. Note reduction in frequency 180 tablet 3  . predniSONE (DELTASONE) 5 MG tablet TAKE 1.5 TABLETS (7.5 MG TOTAL) BY MOUTH DAILY WITH BREAKFAST. 45 tablet 2  . predniSONE (STERAPRED UNI-PAK 21 TAB) 10 MG (21) TBPK tablet Use as directed. 21 each 0  . QUEtiapine (SEROQUEL) 25 MG tablet Take 1 tablet (25 mg total) by mouth at bedtime. 90 tablet 0  . sucralfate (CARAFATE) 1 g tablet Take 1 tablet (1 g total) by mouth 2 (two) times daily.  180 tablet 3  . dicyclomine (BENTYL) 10 MG capsule TAKE 1 CAPSULE (10 MG TOTAL) BY MOUTH 4 (FOUR) TIMES DAILY - BEFORE MEALS AND AT BEDTIME. (Patient taking differently: Take 20 mg by mouth in the morning and at bedtime. ) 360 capsule 3  . enoxaparin (LOVENOX) 40 MG/0.4ML injection Inject 0.4 mLs (40 mg total) into the skin daily. (Patient not taking: No sig reported) 5.6 mL 0   No current facility-administered medications for this visit.     PHYSICAL EXAMINATION: ECOG PERFORMANCE STATUS: 2 - Symptomatic, <50% confined to bed Vitals:   04/25/20 1129  BP: (!) 132/106  Pulse: 74  Temp: (!) 97.1 F (36.2 C)   Filed Weights   04/25/20 1129  Weight: 183 lb (83 kg)    Physical Exam Constitutional:      General: She is not in acute distress.    Comments: Patient sits in the wheelchair  HENT:     Head: Normocephalic and atraumatic.  Eyes:     General: No scleral icterus. Cardiovascular:     Rate and Rhythm: Normal rate and regular rhythm.     Heart sounds: Normal heart sounds.  Pulmonary:     Effort: Pulmonary effort is normal. No respiratory distress.     Breath sounds: No wheezing.  Abdominal:     General: Bowel sounds are normal. There is no distension.     Palpations: Abdomen is soft.  Musculoskeletal:        General: Swelling present. No deformity. Normal range of motion.     Cervical back: Normal range of motion and neck supple.     Comments: Bilateral lower extremity edema, right  worse than left  Skin:    General: Skin is warm and dry.     Findings: No erythema or rash.  Neurological:     Mental Status: She is alert and oriented to person, place, and time. Mental status is at baseline.     Cranial Nerves: No cranial nerve deficit.     Coordination: Coordination normal.  Psychiatric:        Mood and Affect: Mood normal.     CMP Latest Ref Rng & Units 03/24/2020  Glucose 70 - 99 mg/dL 170(H)  BUN 8 - 23 mg/dL 22  Creatinine 0.44 - 1.00 mg/dL 1.52(H)  Sodium 135  - 145 mmol/L 138  Potassium 3.5 - 5.1 mmol/L 4.6  Chloride 98 - 111 mmol/L 103  CO2 22 - 32 mmol/L 23  Calcium 8.9 - 10.3 mg/dL 8.5(L)  Total Protein 6.5 - 8.1 g/dL 7.3  Total Bilirubin 0.3 - 1.2 mg/dL 0.8  Alkaline Phos 38 - 126 U/L 61  AST 15 - 41 U/L 25  ALT 0 - 44 U/L 16   CBC Latest Ref Rng & Units 04/25/2020  WBC 4.0 - 10.5 K/uL 12.5(H)  Hemoglobin 12.0 - 15.0 g/dL 12.2  Hematocrit 36.0 - 46.0 % 38.9  Platelets 150 - 400 K/uL 177     RADIOGRAPHIC STUDIES: I have personally reviewed the radiological images as listed and agreed with the findings in the report. No results found.  LABORATORY DATA:  I have reviewed the data as listed Lab Results  Component Value Date   WBC 12.5 (H) 04/25/2020   HGB 12.2 04/25/2020   HCT 38.9 04/25/2020   MCV 91.5 04/25/2020   PLT 177 04/25/2020   Recent Labs    12/23/19 0631 12/24/19 0454 03/24/20 1517  NA 141 141 138  K 4.8 4.5 4.6  CL 102 99 103  CO2 _0 GLUCOSE 114* 108* 170*  BUN 23 25* 22  CREATININE 1.29* 1.28* 1.52*  CALCIUM 8.8* 8.9 8.5*  GFRNONAA 43* 43* 35*  PROT  --   --  7.3  ALBUMIN  --   --  3.7  AST  --   --  25  ALT  --   --  16  ALKPHOS  --   --  61  BILITOT  --   --  0.8   Iron/TIBC/Ferritin/ %Sat    Component Value Date/Time   IRON 74 12/01/2018 0929   TIBC 395 12/01/2018 0929   FERRITIN 68 12/01/2018 0929   IRONPCTSAT 19 12/01/2018 0929   IRONPCTSAT 12 12/28/2016 1645        ASSESSMENT & PLAN:  1. Leukocytosis, unspecified type   2. Stage 3b chronic kidney disease ([place])    #Leukocytosis Labs reviewed and discussed with patient that Leukocytosis, predominantly neutrophilia, can be secondary to infection, chronic inflammation, smoking, autoimmune disease, or underlying bone marrow disorders.  Patient is on steroids, also has autoimmune disease which probably contributes to the leukocytosis.  Rule out other etiologies. For the work up of patient's leukocytosis, I recommend checking  CBC;CMP, LDH, pathology smear review, flowcytometry, etc.  Chronic kidney disease, check SPEP.    Orders Placed This Encounter  Procedures  . Lactate dehydrogenase    Standing Status:   Future    Number of Occurrences:   1    Standing Expiration Date:   04/25/2021  . CBC with Differential/Platelet    Standing Status:   Future    Number of Occurrences:   1  Standing Expiration Date:   04/25/2021  . Multiple Myeloma Panel (SPEP&IFE w/QIG)    Standing Status:   Future    Number of Occurrences:   1    Standing Expiration Date:   04/25/2021  . Kappa/lambda light chains    Standing Status:   Future    Number of Occurrences:   1    Standing Expiration Date:   04/25/2021  . Flow cytometry panel-leukemia/lymphoma work-up    Standing Status:   Future    Number of Occurrences:   1    Standing Expiration Date:   04/25/2021  . Pathologist smear review    Standing Status:   Future    Number of Occurrences:   1    Standing Expiration Date:   04/25/2021    All questions were answered. The patient knows to call the clinic with any problems questions or concerns.  Return of visit: 2 weeks to discuss labs. Thank you for this kind referral and the opportunity to participate in the care of this patient. A copy of today's note is routed to referring provider   Earlie Server, MD, PhD Hematology Oncology [facility] at [facility] Pager- [phone] 04/25/2020

## 2020-04-26 DIAGNOSIS — E1151 Type 2 diabetes mellitus with diabetic peripheral angiopathy without gangrene: Secondary | ICD-10-CM | POA: Diagnosis not present

## 2020-04-26 DIAGNOSIS — I872 Venous insufficiency (chronic) (peripheral): Secondary | ICD-10-CM | POA: Diagnosis not present

## 2020-04-26 DIAGNOSIS — J449 Chronic obstructive pulmonary disease, unspecified: Secondary | ICD-10-CM | POA: Diagnosis not present

## 2020-04-26 DIAGNOSIS — N1832 Chronic kidney disease, stage 3b: Secondary | ICD-10-CM | POA: Diagnosis not present

## 2020-04-26 DIAGNOSIS — D631 Anemia in chronic kidney disease: Secondary | ICD-10-CM | POA: Diagnosis not present

## 2020-04-26 DIAGNOSIS — E1142 Type 2 diabetes mellitus with diabetic polyneuropathy: Secondary | ICD-10-CM | POA: Diagnosis not present

## 2020-04-26 DIAGNOSIS — S42122D Displaced fracture of acromial process, left shoulder, subsequent encounter for fracture with routine healing: Secondary | ICD-10-CM | POA: Diagnosis not present

## 2020-04-26 DIAGNOSIS — E1122 Type 2 diabetes mellitus with diabetic chronic kidney disease: Secondary | ICD-10-CM | POA: Diagnosis not present

## 2020-04-26 DIAGNOSIS — I129 Hypertensive chronic kidney disease with stage 1 through stage 4 chronic kidney disease, or unspecified chronic kidney disease: Secondary | ICD-10-CM | POA: Diagnosis not present

## 2020-04-26 LAB — KAPPA/LAMBDA LIGHT CHAINS
Kappa free light chain: 35.4 mg/L — ABNORMAL HIGH (ref 3.3–19.4)
Kappa, lambda light chain ratio: 1.38 (ref 0.26–1.65)
Lambda free light chains: 25.6 mg/L (ref 5.7–26.3)

## 2020-04-27 ENCOUNTER — Other Ambulatory Visit: Payer: Self-pay | Admitting: Urology

## 2020-04-29 ENCOUNTER — Ambulatory Visit: Payer: Medicare PPO

## 2020-04-29 LAB — COMP PANEL: LEUKEMIA/LYMPHOMA

## 2020-04-30 ENCOUNTER — Other Ambulatory Visit: Payer: Self-pay

## 2020-04-30 ENCOUNTER — Encounter: Payer: Self-pay | Admitting: Pulmonary Disease

## 2020-04-30 ENCOUNTER — Ambulatory Visit: Payer: Medicare PPO | Admitting: Pulmonary Disease

## 2020-04-30 VITALS — BP 130/58 | HR 91 | Temp 97.7°F | Ht 65.5 in | Wt 182.2 lb

## 2020-04-30 DIAGNOSIS — S42122D Displaced fracture of acromial process, left shoulder, subsequent encounter for fracture with routine healing: Secondary | ICD-10-CM | POA: Diagnosis not present

## 2020-04-30 DIAGNOSIS — J449 Chronic obstructive pulmonary disease, unspecified: Secondary | ICD-10-CM

## 2020-04-30 DIAGNOSIS — J8489 Other specified interstitial pulmonary diseases: Secondary | ICD-10-CM | POA: Diagnosis not present

## 2020-04-30 DIAGNOSIS — G4736 Sleep related hypoventilation in conditions classified elsewhere: Secondary | ICD-10-CM

## 2020-04-30 DIAGNOSIS — D631 Anemia in chronic kidney disease: Secondary | ICD-10-CM | POA: Diagnosis not present

## 2020-04-30 DIAGNOSIS — R059 Cough, unspecified: Secondary | ICD-10-CM | POA: Diagnosis not present

## 2020-04-30 DIAGNOSIS — N1832 Chronic kidney disease, stage 3b: Secondary | ICD-10-CM | POA: Diagnosis not present

## 2020-04-30 DIAGNOSIS — M0579 Rheumatoid arthritis with rheumatoid factor of multiple sites without organ or systems involvement: Secondary | ICD-10-CM | POA: Diagnosis not present

## 2020-04-30 DIAGNOSIS — E1142 Type 2 diabetes mellitus with diabetic polyneuropathy: Secondary | ICD-10-CM | POA: Diagnosis not present

## 2020-04-30 DIAGNOSIS — I129 Hypertensive chronic kidney disease with stage 1 through stage 4 chronic kidney disease, or unspecified chronic kidney disease: Secondary | ICD-10-CM | POA: Diagnosis not present

## 2020-04-30 DIAGNOSIS — J4489 Other specified chronic obstructive pulmonary disease: Secondary | ICD-10-CM

## 2020-04-30 DIAGNOSIS — E1122 Type 2 diabetes mellitus with diabetic chronic kidney disease: Secondary | ICD-10-CM | POA: Diagnosis not present

## 2020-04-30 DIAGNOSIS — E1151 Type 2 diabetes mellitus with diabetic peripheral angiopathy without gangrene: Secondary | ICD-10-CM | POA: Diagnosis not present

## 2020-04-30 DIAGNOSIS — I872 Venous insufficiency (chronic) (peripheral): Secondary | ICD-10-CM | POA: Diagnosis not present

## 2020-04-30 LAB — MULTIPLE MYELOMA PANEL, SERUM
Albumin SerPl Elph-Mcnc: 3.5 g/dL (ref 2.9–4.4)
Albumin/Glob SerPl: 1.2 (ref 0.7–1.7)
Alpha 1: 0.3 g/dL (ref 0.0–0.4)
Alpha2 Glob SerPl Elph-Mcnc: 1 g/dL (ref 0.4–1.0)
B-Globulin SerPl Elph-Mcnc: 1 g/dL (ref 0.7–1.3)
Gamma Glob SerPl Elph-Mcnc: 0.9 g/dL (ref 0.4–1.8)
Globulin, Total: 3.1 g/dL (ref 2.2–3.9)
IgA: 263 mg/dL (ref 64–422)
IgG (Immunoglobin G), Serum: 805 mg/dL (ref 586–1602)
IgM (Immunoglobulin M), Srm: 125 mg/dL (ref 26–217)
Total Protein ELP: 6.6 g/dL (ref 6.0–8.5)

## 2020-04-30 NOTE — Patient Instructions (Signed)
Continue your current medications.  I will check your CT scan of the chest after it is done.  We will see you in follow-up in 3 months time call sooner should any new problems arise.

## 2020-04-30 NOTE — Progress Notes (Signed)
Subjective:    Patient ID: Kerri Carter, female    DOB: Jun 16, 1940, 80 y.o.   MRN: [id number]  HPI Kerri Carter is a 80 year old former smoker (quit 1976) who presents for follow-up for various issues to include asthma/COPD overlap syndrome, NSIP related to rheumatoid arthritis (rheumatoid lung) and cough.  This is a scheduled appointment.  She was last seen here on 19 December 2019.  Patient states that since her last visit she has done well.  She has not had any major exacerbations.  She has issues with cough that occur mostly in the morning and postprandially.  For the most part the cough does not bother her.  She notes better control of this symptom on steroids.  She has not had any fevers, chills or sweats.  No orthopnea or paroxysmal nocturnal dyspnea.  She is compliant with nocturnal oxygen and notes that she sleeps better and is less symptomatic with dyspnea and cough during the day with this treatment.  For the most part she feels well.  She looks well today.   Review of Systems A 10 point review of systems was performed and it is as noted above otherwise negative.  Patient Active Problem List   Diagnosis Date Noted  . Acromial process of scapula fracture 12/21/2019  . NSIP (nonspecific interstitial pneumonitis) ([place]) 12/08/2019  . Iliopsoas bursitis of right hip 08/29/2019  . Chronic left shoulder pain 08/29/2019  . Overweight (BMI 25.0-29.9) 08/29/2019  . Hypertriglyceridemia 07/24/2019  . Bipolar disorder, in full remission, most recent episode depressed ([place]) 07/13/2019  . Essential hypertension 06/16/2019  . Chronic pain 06/16/2019  . Lymphedema 06/05/2019  . Chronic venous insufficiency 05/25/2019  . PAD (peripheral artery disease) ([place]) 05/25/2019  . Leg edema 05/02/2019  . Episodic mood disorder ([place]) 04/18/2019  . Complicated grief 38/25/0539  . At high risk for falls 03/16/2019  . Pelvic mass in female 03/16/2019  . Compression fracture of L4 vertebra (HCC) 02/21/2019   . Collapsed vertebra, not elsewhere classified, lumbar region, initial encounter for fracture ([place]) 02/21/2019  . Anxiety 01/20/2019  . Pain due to onychomycosis of toenails of both feet 12/05/2018  . Hav (hallux abducto valgus), unspecified laterality 12/05/2018  . Closed fracture of acromial process of scapula 10/28/2018  . Status post reverse arthroplasty of shoulder, left 07/26/2018  . Interstitial pulmonary disease ([place]) 07/07/2018  . Fatigue 07/07/2018  . Avascular necrosis of left shoulder due to adverse effect of steroid therapy ([place]) 01/20/2018  . Other shoulder lesions, left shoulder 12/20/2017  . Status post reverse total shoulder replacement, right 11/04/2017  . Leukocytosis 10/19/2017  . Allergic rhinitis 09/28/2017  . Rotator cuff tendinitis, right 07/26/2017  . Strain of right hip 02/26/2017  . History of fracture of left hip 01/15/2017  . Status post lumbar spinal fusion 01/15/2017  . Dislocation of hip joint prosthesis ([place]) 01/08/2017  . Status post hip hemiarthroplasty 01/08/2017  . Leg swelling 12/15/2016  . Age-related osteoporosis without current pathological fracture 11/05/2016  . Hip fracture ([place]) 10/31/2016  . Polyneuropathy 10/28/2016  . Left foot pain 10/19/2016  . Spinal stenosis of lumbar region 09/15/2016  . Osteoporosis 08/27/2016  . Drug-induced osteoporosis 08/27/2016  . Adnexal mass 08/25/2016  . Prediabetes 08/25/2016  . Coronary atherosclerosis 08/25/2016  . History of syncope 08/25/2016  . Hypokalemia 08/25/2016  . Mixed bipolar I disorder ([place]) 10/09/2015  . Stage 3b chronic kidney disease ([place])   . Conversion disorder 08/04/2015  . Hyperlipidemia 07/17/2015  . 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 Macular degeneration 07/17/2015  . Insomnia 07/17/2015  . Rheumatoid arthritis ([place]) 12/05/2014  . Other specified postprocedural states 07/20/2013  . Anxiety and depression 06/13/2013  . Osteoarthritis of knee  06/07/2013  . Cough 05/02/2012  . Valvular heart disease 10/13/2011  . Asthma-COPD overlap syndrome ([place]) 09/09/2011  . OSA (obstructive sleep apnea) 09/09/2011  . Nocturnal hypoxemia 09/09/2011  . Gastroesophageal reflux disease 02/24/2011  . Single kidney 02/24/2011  . H/O cardiac catheterization 02/24/2011  . Renal artery stenosis (HCC) 02/24/2011     Allergies  Allergen Reactions  . Ceftin [Cefuroxime Axetil] Anaphylaxis  . Lisinopril Anaphylaxis  . Sulfa Antibiotics Other (See Comments)    Face swelling  . Sulfasalazine Anaphylaxis  . Morphine Other (See Comments)    Per patient, low blood pressure issues that requires action to raise it back up. Can take small infrequent doses  . Xarelto [Rivaroxaban] Other (See Comments)    Stomach burning, bleeding, and tar in stool  . Adhesive [Tape] Rash    Paper tape and tega derm OK  . Antihistamines, Chlorpheniramine-Type Other (See Comments)    Makes pt hyper  . Antivert [Meclizine Hcl] Other (See Comments)    Bladder will not empty  . Aspirin Other (See Comments)    Sulfasalazine allergy cross reacts  . Contrast Media [Iodinated Diagnostic Agents] Rash    she is able to use betadine scrubs.  [place] [Pseudoephedrine Hcl] Other (See Comments)    Makes pt hyper  . Doxycycline Other (See Comments)    GI upset  . Levaquin [Levofloxacin In D5w] Rash  . Polymyxin B Other (See Comments)    Facial rash  . Tetanus Toxoids Rash and Other (See Comments)    Fever and hot to touch at injection site   Current Meds  Medication Sig  . Adalimumab 40 MG/0.4ML PNKT Inject 40 mg into the skin every 14 (fourteen) days.   Marland Kitchen albuterol (VENTOLIN HFA) 108 (90 Base) MCG/ACT inhaler Inhale 2 puffs into the lungs every 6 (six) hours as needed. (Patient taking differently: Inhale 2 puffs into the lungs every 6 (six) hours as needed for wheezing or shortness of breath.)  . ALPRAZolam (XANAX) 0.25 MG tablet Take 1 tablet (0.25 mg total) by  mouth at bedtime as needed for anxiety.  Marland Kitchen amLODipine (NORVASC) 2.5 MG tablet Take 2.5 mg by mouth daily.  . benzonatate (TESSALON) 200 MG capsule Take 1 capsule (200 mg total) by mouth 3 (three) times daily as needed for cough.  . budesonide (PULMICORT) 0.25 MG/2ML nebulizer solution USE 1 VIAL  IN  NEBULIZER TWICE  DAILY - rinse mouth after treatment  . Cholecalciferol 50 MCG (2000 UT) CAPS Take 4,000 Units by mouth daily.   Marland Kitchen escitalopram (LEXAPRO) 10 MG tablet Take 1 tablet (10 mg total) by mouth daily.  Marland Kitchen etodolac (LODINE) 500 MG tablet Take 500 mg by mouth at bedtime.  . famotidine (PEPCID) 20 MG tablet Take 1 tablet (20 mg total) by mouth daily. Before breakfast or dinner  . fluticasone (FLONASE) 50 MCG/ACT nasal spray Place 2 sprays into both nostrils daily. In am  . gabapentin (NEURONTIN) 300 MG capsule Take 2 capsules (600 mg total) by mouth in the morning and at bedtime.  Marland Kitchen ipratropium-albuterol (DUONEB) 0.5-2.5 (3) MG/3ML SOLN Take 3 mLs by nebulization 3 (three) times daily as needed. (Patient taking differently: Take 3 mLs by nebulization 3 (three) times daily as needed (shortness of breath).)  . lamoTRIgine (LAMICTAL) 100  MG tablet Take 1 tablet (100 mg total) by mouth 2 (two) times daily.  Marland Kitchen leflunomide (ARAVA) 20 MG tablet Take 1 tablet (20 mg total) by mouth daily.  Marland Kitchen lovastatin (MEVACOR) 20 MG tablet Take 1 tablet (20 mg total) by mouth at bedtime.  . montelukast (SINGULAIR) 10 MG tablet Take 1 tablet (10 mg total) by mouth daily.  Marland Kitchen MYRBETRIQ 50 MG TB24 tablet TAKE 1 TABLET BY MOUTH EVERYDAY AT BEDTIME  . nebivolol (BYSTOLIC) 10 MG tablet Take 0.5 tablets (5 mg total) by mouth in the morning and at bedtime.  . pantoprazole (PROTONIX) 40 MG tablet Take 1 tablet (40 mg total) by mouth 2 (two) times daily. 30 minutes before food. Note reduction in frequency  . predniSONE (DELTASONE) 5 MG tablet TAKE 1.5 TABLETS (7.5 MG TOTAL) BY MOUTH DAILY WITH BREAKFAST.  . QUEtiapine (SEROQUEL)  25 MG tablet Take 1 tablet (25 mg total) by mouth at bedtime.  . sucralfate (CARAFATE) 1 g tablet Take 1 tablet (1 g total) by mouth 2 (two) times daily.   Immunization History  Administered Date(s) Administered  . Fluad Quad(high Dose 65+) 10/03/2018, 10/16/2019  . Influenza Split 10/04/2012, 10/25/2013, 10/28/2016  . Influenza Whole 11/03/2011  . Influenza, High Dose Seasonal PF 10/04/2015, 11/03/2016  . Influenza,inj,Quad PF,6+ Mos 10/31/2014  . Influenza-Unspecified 01/02/2010, 11/24/2011, 10/21/2013, 10/31/2014, 09/28/2017  . Pneumococcal Conjugate-13 10/21/2013  . Pneumococcal Polysaccharide-23 11/07/2015       Objective:   Physical Exam BP (!) 130/58 (BP Location: Left Arm, Cuff Size: Normal)   Pulse 91   Temp 97.7 F (36.5 C) (Temporal)   Ht 5' 5.5" (1.664 m)   Wt 182 lb 3.2 oz (82.6 kg)   SpO2 95%   BMI 29.86 kg/m   GENERAL: Well-developed, well-nourished elderly woman, she presents on transport chair today.  HEAD: Normocephalic, atraumatic.  EYES: Pupils equal, round, reactive to light. No scleral icterus.  MOUTH: Nose/mouth/throat not examined due to masking requirements for COVID 19. NECK: Supple. No thyromegaly. Trachea midline. No JVD. No adenopathy. PULMONARY: Coarse breath sounds throughout, faint dry crackles at the basesmore pronounced on the left base, occasional end expiratory wheeze. CARDIOVASCULAR: S1 and S2. Regular rate and rhythm.Grade 1/6 systolic ejection murmur left sternal border, unchanged. GASTROINTESTINAL: Benign MUSCULOSKELETAL: Mild changes of RA both hands, no clubbing, no edema. Limited range of motion of joints due to RA. NEUROLOGIC: No overt focal deficits. Awake, alert, speech is fluent. SKIN: Intact,warm,dry. PSYCH:Mood and behavior appropriate     Assessment & Plan:     ICD-10-CM   1. NSIP (nonspecific interstitial pneumonitis) (HCC)  J84.89    Continue low-dose prednisone Has CT scan of the chest scheduled for  tomorrow We will review scan and discuss with patient  2. Asthma-COPD overlap syndrome (HCC)  J44.9    Continue duo nebs and Pulmicort Appears well compensated  3. Rheumatoid arthritis involving multiple sites with positive rheumatoid factor (HCC)  M05.79    This issue adds complexity to her management NSIP related to this  4. Cough  R05.9    Better controlled Steroid responsive Continue nebulized Pulmicort Continue low-dose prednisone  5. Nocturnal hypoxemia due to obstructive chronic bronchitis (HCC)  J44.9    G47.36    Previously diagnosed with OSA Intolerant of CPAP She is compliant with nocturnal oxygen Notes improvement in wellbeing with oxygen use   Discussion:  Patient appears well compensated, continue current management.  We will continue low-dose prednisone at 7.5 mg daily, consider decreasing to 5 mg  daily pending results of CT chest.  Follow-up in 3 months time call sooner should any new problems arise.   Renold Don, MD [facility]   *This note was dictated using voice recognition software/Dragon.  Despite best efforts to proofread, errors can occur which can change the meaning.  Any change was purely unintentional.

## 2020-05-01 ENCOUNTER — Ambulatory Visit
Admission: RE | Admit: 2020-05-01 | Discharge: 2020-05-01 | Disposition: A | Discharge status: Home / Self Care | Payer: Medicare PPO | Source: Ambulatory Visit | Attending: Internal Medicine | Admitting: Internal Medicine

## 2020-05-01 DIAGNOSIS — J984 Other disorders of lung: Secondary | ICD-10-CM | POA: Diagnosis not present

## 2020-05-01 DIAGNOSIS — J8489 Other specified interstitial pulmonary diseases: Secondary | ICD-10-CM | POA: Diagnosis not present

## 2020-05-01 DIAGNOSIS — I2510 Atherosclerotic heart disease of native coronary artery without angina pectoris: Secondary | ICD-10-CM | POA: Diagnosis not present

## 2020-05-01 DIAGNOSIS — J849 Interstitial pulmonary disease, unspecified: Secondary | ICD-10-CM | POA: Diagnosis not present

## 2020-05-01 DIAGNOSIS — E049 Nontoxic goiter, unspecified: Secondary | ICD-10-CM | POA: Diagnosis not present

## 2020-05-01 DIAGNOSIS — J9809 Other diseases of bronchus, not elsewhere classified: Secondary | ICD-10-CM | POA: Diagnosis not present

## 2020-05-02 ENCOUNTER — Telehealth: Payer: Self-pay | Admitting: Pulmonary Disease

## 2020-05-02 DIAGNOSIS — D631 Anemia in chronic kidney disease: Secondary | ICD-10-CM | POA: Diagnosis not present

## 2020-05-02 DIAGNOSIS — J449 Chronic obstructive pulmonary disease, unspecified: Secondary | ICD-10-CM | POA: Diagnosis not present

## 2020-05-02 DIAGNOSIS — S42122D Displaced fracture of acromial process, left shoulder, subsequent encounter for fracture with routine healing: Secondary | ICD-10-CM | POA: Diagnosis not present

## 2020-05-02 DIAGNOSIS — E1151 Type 2 diabetes mellitus with diabetic peripheral angiopathy without gangrene: Secondary | ICD-10-CM | POA: Diagnosis not present

## 2020-05-02 DIAGNOSIS — I872 Venous insufficiency (chronic) (peripheral): Secondary | ICD-10-CM | POA: Diagnosis not present

## 2020-05-02 DIAGNOSIS — E1122 Type 2 diabetes mellitus with diabetic chronic kidney disease: Secondary | ICD-10-CM | POA: Diagnosis not present

## 2020-05-02 DIAGNOSIS — E1142 Type 2 diabetes mellitus with diabetic polyneuropathy: Secondary | ICD-10-CM | POA: Diagnosis not present

## 2020-05-02 DIAGNOSIS — I129 Hypertensive chronic kidney disease with stage 1 through stage 4 chronic kidney disease, or unspecified chronic kidney disease: Secondary | ICD-10-CM | POA: Diagnosis not present

## 2020-05-02 DIAGNOSIS — N1832 Chronic kidney disease, stage 3b: Secondary | ICD-10-CM | POA: Diagnosis not present

## 2020-05-02 NOTE — Telephone Encounter (Signed)
Called and spoke to patient.  Patient had CT on 05/01/2020 and was advised by PCP to f/u with pulmonary.  At 04/30/2020 visit, she was instructed to f/u in 3mo.   Dr. Patsey Berthold, please advise if patient needs to be seen sooner? Thanks.

## 2020-05-02 NOTE — Telephone Encounter (Signed)
Please see result note 

## 2020-05-06 DIAGNOSIS — Z79899 Other long term (current) drug therapy: Secondary | ICD-10-CM | POA: Diagnosis not present

## 2020-05-06 DIAGNOSIS — M0579 Rheumatoid arthritis with rheumatoid factor of multiple sites without organ or systems involvement: Secondary | ICD-10-CM | POA: Diagnosis not present

## 2020-05-08 DIAGNOSIS — E1122 Type 2 diabetes mellitus with diabetic chronic kidney disease: Secondary | ICD-10-CM | POA: Diagnosis not present

## 2020-05-08 DIAGNOSIS — E1142 Type 2 diabetes mellitus with diabetic polyneuropathy: Secondary | ICD-10-CM | POA: Diagnosis not present

## 2020-05-08 DIAGNOSIS — J449 Chronic obstructive pulmonary disease, unspecified: Secondary | ICD-10-CM | POA: Diagnosis not present

## 2020-05-08 DIAGNOSIS — D631 Anemia in chronic kidney disease: Secondary | ICD-10-CM | POA: Diagnosis not present

## 2020-05-08 DIAGNOSIS — I872 Venous insufficiency (chronic) (peripheral): Secondary | ICD-10-CM | POA: Diagnosis not present

## 2020-05-08 DIAGNOSIS — I129 Hypertensive chronic kidney disease with stage 1 through stage 4 chronic kidney disease, or unspecified chronic kidney disease: Secondary | ICD-10-CM | POA: Diagnosis not present

## 2020-05-08 DIAGNOSIS — S42122D Displaced fracture of acromial process, left shoulder, subsequent encounter for fracture with routine healing: Secondary | ICD-10-CM | POA: Diagnosis not present

## 2020-05-08 DIAGNOSIS — N1832 Chronic kidney disease, stage 3b: Secondary | ICD-10-CM | POA: Diagnosis not present

## 2020-05-08 DIAGNOSIS — E1151 Type 2 diabetes mellitus with diabetic peripheral angiopathy without gangrene: Secondary | ICD-10-CM | POA: Diagnosis not present

## 2020-05-09 ENCOUNTER — Ambulatory Visit: Payer: Medicare PPO | Admitting: Psychiatry

## 2020-05-09 DIAGNOSIS — S42122D Displaced fracture of acromial process, left shoulder, subsequent encounter for fracture with routine healing: Secondary | ICD-10-CM | POA: Diagnosis not present

## 2020-05-09 DIAGNOSIS — E1151 Type 2 diabetes mellitus with diabetic peripheral angiopathy without gangrene: Secondary | ICD-10-CM | POA: Diagnosis not present

## 2020-05-09 DIAGNOSIS — I872 Venous insufficiency (chronic) (peripheral): Secondary | ICD-10-CM | POA: Diagnosis not present

## 2020-05-09 DIAGNOSIS — E1142 Type 2 diabetes mellitus with diabetic polyneuropathy: Secondary | ICD-10-CM | POA: Diagnosis not present

## 2020-05-09 DIAGNOSIS — E1122 Type 2 diabetes mellitus with diabetic chronic kidney disease: Secondary | ICD-10-CM | POA: Diagnosis not present

## 2020-05-09 DIAGNOSIS — J449 Chronic obstructive pulmonary disease, unspecified: Secondary | ICD-10-CM | POA: Diagnosis not present

## 2020-05-09 DIAGNOSIS — N1832 Chronic kidney disease, stage 3b: Secondary | ICD-10-CM | POA: Diagnosis not present

## 2020-05-09 DIAGNOSIS — I129 Hypertensive chronic kidney disease with stage 1 through stage 4 chronic kidney disease, or unspecified chronic kidney disease: Secondary | ICD-10-CM | POA: Diagnosis not present

## 2020-05-09 DIAGNOSIS — D631 Anemia in chronic kidney disease: Secondary | ICD-10-CM | POA: Diagnosis not present

## 2020-05-10 ENCOUNTER — Inpatient Hospital Stay: Payer: Medicare PPO | Admitting: Oncology

## 2020-05-13 DIAGNOSIS — J449 Chronic obstructive pulmonary disease, unspecified: Secondary | ICD-10-CM | POA: Diagnosis not present

## 2020-05-15 DIAGNOSIS — J449 Chronic obstructive pulmonary disease, unspecified: Secondary | ICD-10-CM | POA: Diagnosis not present

## 2020-05-15 DIAGNOSIS — I129 Hypertensive chronic kidney disease with stage 1 through stage 4 chronic kidney disease, or unspecified chronic kidney disease: Secondary | ICD-10-CM | POA: Diagnosis not present

## 2020-05-15 DIAGNOSIS — N1832 Chronic kidney disease, stage 3b: Secondary | ICD-10-CM | POA: Diagnosis not present

## 2020-05-15 DIAGNOSIS — E1142 Type 2 diabetes mellitus with diabetic polyneuropathy: Secondary | ICD-10-CM | POA: Diagnosis not present

## 2020-05-15 DIAGNOSIS — E1122 Type 2 diabetes mellitus with diabetic chronic kidney disease: Secondary | ICD-10-CM | POA: Diagnosis not present

## 2020-05-15 DIAGNOSIS — I872 Venous insufficiency (chronic) (peripheral): Secondary | ICD-10-CM | POA: Diagnosis not present

## 2020-05-15 DIAGNOSIS — S42122D Displaced fracture of acromial process, left shoulder, subsequent encounter for fracture with routine healing: Secondary | ICD-10-CM | POA: Diagnosis not present

## 2020-05-15 DIAGNOSIS — E1151 Type 2 diabetes mellitus with diabetic peripheral angiopathy without gangrene: Secondary | ICD-10-CM | POA: Diagnosis not present

## 2020-05-15 DIAGNOSIS — D631 Anemia in chronic kidney disease: Secondary | ICD-10-CM | POA: Diagnosis not present

## 2020-05-16 DIAGNOSIS — I872 Venous insufficiency (chronic) (peripheral): Secondary | ICD-10-CM | POA: Diagnosis not present

## 2020-05-16 DIAGNOSIS — E1122 Type 2 diabetes mellitus with diabetic chronic kidney disease: Secondary | ICD-10-CM | POA: Diagnosis not present

## 2020-05-16 DIAGNOSIS — E1151 Type 2 diabetes mellitus with diabetic peripheral angiopathy without gangrene: Secondary | ICD-10-CM | POA: Diagnosis not present

## 2020-05-16 DIAGNOSIS — I129 Hypertensive chronic kidney disease with stage 1 through stage 4 chronic kidney disease, or unspecified chronic kidney disease: Secondary | ICD-10-CM | POA: Diagnosis not present

## 2020-05-16 DIAGNOSIS — E1142 Type 2 diabetes mellitus with diabetic polyneuropathy: Secondary | ICD-10-CM | POA: Diagnosis not present

## 2020-05-16 DIAGNOSIS — S42122D Displaced fracture of acromial process, left shoulder, subsequent encounter for fracture with routine healing: Secondary | ICD-10-CM | POA: Diagnosis not present

## 2020-05-16 DIAGNOSIS — D631 Anemia in chronic kidney disease: Secondary | ICD-10-CM | POA: Diagnosis not present

## 2020-05-16 DIAGNOSIS — J449 Chronic obstructive pulmonary disease, unspecified: Secondary | ICD-10-CM | POA: Diagnosis not present

## 2020-05-16 DIAGNOSIS — N1832 Chronic kidney disease, stage 3b: Secondary | ICD-10-CM | POA: Diagnosis not present

## 2020-05-20 DIAGNOSIS — M810 Age-related osteoporosis without current pathological fracture: Secondary | ICD-10-CM | POA: Diagnosis not present

## 2020-05-20 DIAGNOSIS — Z96612 Presence of left artificial shoulder joint: Secondary | ICD-10-CM | POA: Diagnosis not present

## 2020-05-20 DIAGNOSIS — M0579 Rheumatoid arthritis with rheumatoid factor of multiple sites without organ or systems involvement: Secondary | ICD-10-CM | POA: Diagnosis not present

## 2020-05-20 DIAGNOSIS — S42122K Displaced fracture of acromial process, left shoulder, subsequent encounter for fracture with nonunion: Secondary | ICD-10-CM | POA: Diagnosis not present

## 2020-05-20 DIAGNOSIS — J849 Interstitial pulmonary disease, unspecified: Secondary | ICD-10-CM | POA: Diagnosis not present

## 2020-05-20 DIAGNOSIS — Z79899 Other long term (current) drug therapy: Secondary | ICD-10-CM | POA: Diagnosis not present

## 2020-05-21 ENCOUNTER — Encounter: Payer: Self-pay | Admitting: Oncology

## 2020-05-21 ENCOUNTER — Inpatient Hospital Stay: Payer: Medicare PPO | Attending: Oncology | Admitting: Oncology

## 2020-05-21 DIAGNOSIS — D631 Anemia in chronic kidney disease: Secondary | ICD-10-CM | POA: Diagnosis not present

## 2020-05-21 DIAGNOSIS — M069 Rheumatoid arthritis, unspecified: Secondary | ICD-10-CM | POA: Insufficient documentation

## 2020-05-21 DIAGNOSIS — K219 Gastro-esophageal reflux disease without esophagitis: Secondary | ICD-10-CM | POA: Diagnosis not present

## 2020-05-21 DIAGNOSIS — Z808 Family history of malignant neoplasm of other organs or systems: Secondary | ICD-10-CM | POA: Diagnosis not present

## 2020-05-21 DIAGNOSIS — Z811 Family history of alcohol abuse and dependence: Secondary | ICD-10-CM | POA: Insufficient documentation

## 2020-05-21 DIAGNOSIS — Z87891 Personal history of nicotine dependence: Secondary | ICD-10-CM | POA: Insufficient documentation

## 2020-05-21 DIAGNOSIS — Z79899 Other long term (current) drug therapy: Secondary | ICD-10-CM | POA: Insufficient documentation

## 2020-05-21 DIAGNOSIS — D72829 Elevated white blood cell count, unspecified: Secondary | ICD-10-CM | POA: Insufficient documentation

## 2020-05-21 DIAGNOSIS — I739 Peripheral vascular disease, unspecified: Secondary | ICD-10-CM | POA: Diagnosis not present

## 2020-05-21 DIAGNOSIS — Z800 Family history of malignant neoplasm of digestive organs: Secondary | ICD-10-CM | POA: Insufficient documentation

## 2020-05-21 DIAGNOSIS — E1122 Type 2 diabetes mellitus with diabetic chronic kidney disease: Secondary | ICD-10-CM | POA: Diagnosis not present

## 2020-05-21 DIAGNOSIS — I872 Venous insufficiency (chronic) (peripheral): Secondary | ICD-10-CM | POA: Diagnosis not present

## 2020-05-21 DIAGNOSIS — E1142 Type 2 diabetes mellitus with diabetic polyneuropathy: Secondary | ICD-10-CM | POA: Diagnosis not present

## 2020-05-21 DIAGNOSIS — D721 Eosinophilia, unspecified: Secondary | ICD-10-CM | POA: Insufficient documentation

## 2020-05-21 DIAGNOSIS — J449 Chronic obstructive pulmonary disease, unspecified: Secondary | ICD-10-CM | POA: Diagnosis not present

## 2020-05-21 DIAGNOSIS — Z8261 Family history of arthritis: Secondary | ICD-10-CM | POA: Insufficient documentation

## 2020-05-21 DIAGNOSIS — Z818 Family history of other mental and behavioral disorders: Secondary | ICD-10-CM | POA: Diagnosis not present

## 2020-05-21 DIAGNOSIS — Z7952 Long term (current) use of systemic steroids: Secondary | ICD-10-CM | POA: Insufficient documentation

## 2020-05-21 DIAGNOSIS — Z8269 Family history of other diseases of the musculoskeletal system and connective tissue: Secondary | ICD-10-CM | POA: Insufficient documentation

## 2020-05-21 DIAGNOSIS — E1151 Type 2 diabetes mellitus with diabetic peripheral angiopathy without gangrene: Secondary | ICD-10-CM | POA: Diagnosis not present

## 2020-05-21 DIAGNOSIS — Z836 Family history of other diseases of the respiratory system: Secondary | ICD-10-CM | POA: Insufficient documentation

## 2020-05-21 DIAGNOSIS — Z833 Family history of diabetes mellitus: Secondary | ICD-10-CM | POA: Diagnosis not present

## 2020-05-21 DIAGNOSIS — Z823 Family history of stroke: Secondary | ICD-10-CM | POA: Diagnosis not present

## 2020-05-21 DIAGNOSIS — N1832 Chronic kidney disease, stage 3b: Secondary | ICD-10-CM | POA: Diagnosis not present

## 2020-05-21 DIAGNOSIS — Z8249 Family history of ischemic heart disease and other diseases of the circulatory system: Secondary | ICD-10-CM | POA: Insufficient documentation

## 2020-05-21 DIAGNOSIS — Z803 Family history of malignant neoplasm of breast: Secondary | ICD-10-CM | POA: Diagnosis not present

## 2020-05-21 DIAGNOSIS — I129 Hypertensive chronic kidney disease with stage 1 through stage 4 chronic kidney disease, or unspecified chronic kidney disease: Secondary | ICD-10-CM | POA: Diagnosis not present

## 2020-05-21 DIAGNOSIS — S42122D Displaced fracture of acromial process, left shoulder, subsequent encounter for fracture with routine healing: Secondary | ICD-10-CM | POA: Diagnosis not present

## 2020-05-21 NOTE — Progress Notes (Signed)
HEMATOLOGY-ONCOLOGY TeleHEALTH VISIT PROGRESS NOTE  I connected with Kerri Carter on 05/21/20  at  2:30 PM EDT by video enabled telemedicine visit and verified that I am speaking with the correct person using two identifiers. I discussed the limitations, risks, security and privacy concerns of performing an evaluation and management service by telemedicine and the availability of in-person appointments. The patient expressed understanding and agreed to proceed.   Other persons participating in the visit and their role in the encounter:  None  Patient's location: Home  Provider's location: office Chief Complaint: Leukocytosis   INTERVAL HISTORY Kerri Carter is a 80 y.o. female who has above history reviewed by me today presents for follow up visit for management of leukocytosis Problems and complaints are listed below:  Patient had blood work done during interval and presents to discuss results. I attempted to connect the patient for visual enabled telehealth visit.  Due to the technical difficulties with video,  Patient was transitioned to audio only visit. She has no new complaints  Review of Systems  Constitutional: Negative for appetite change, chills, fatigue and fever.  HENT:   Negative for hearing loss and voice change.   Eyes: Negative for eye problems.  Respiratory: Negative for chest tightness and cough.   Cardiovascular: Negative for chest pain.  Gastrointestinal: Negative for abdominal distention, abdominal pain and blood in stool.  Endocrine: Negative for hot flashes.  Genitourinary: Negative for difficulty urinating and frequency.   Musculoskeletal: Negative for arthralgias.  Skin: Negative for itching and rash.  Neurological: Negative for extremity weakness.  Hematological: Negative for adenopathy.  Psychiatric/Behavioral: Negative for confusion.    Past Medical History:  Diagnosis Date  . Anxiety   . Asthma   . Bipolar disorder ([place])   . C. difficile diarrhea     from Abx 2010 hospitalized   . Cataract    s/p b/l repair   . Chicken pox   . CKD (chronic kidney disease)   . CKD (chronic kidney disease), stage III ([place])    a. s/p R nephrectomy./ aneurysm  . Conversion disorder   . COPD (chronic obstructive pulmonary disease) ([place])   . Depression   . Essential hypertension   . GERD (gastroesophageal reflux disease)   . Hyperlipidemia   . ILD (interstitial lung disease) ([place])    mild; 2/2 RA diagnosis  . Inflammatory arthritis    a. hands/carpal tunnel.  b. Low titer rheumatoid factor. c. Negative anti-CCP antibodies. d. Plaquenil.  . Nocturnal hypoxemia   . Non-Obstructive CAD    a. 07/2009 Cath ([facility]): nonobs dzs;  b. 03/2011 [facility]): nonobs dzs.  . Osteoarthritis    a. Knees.  Marland Kitchen PAD (peripheral artery disease) ([place])   . PUD (peptic ulcer disease)   . S/P right hip fracture    11/01/16 s/p repair  . Shoulder pain   . Sleep apnea    no cpap / minimal  . Spinal stenosis at L4-L5 level    severe with L4/L5 anterolisthesis grade 1 anterolisthesis   . Toxic maculopathy   . Valvular heart disease    a. 07/2015 Echo: EF 55-60%, Mild AI, AS, MR, and TR.   Past Surgical History:  Procedure Laterality Date  . APPENDECTOMY    . BACK SURGERY     lumbar fusion  . BUNIONECTOMY Right   . CATARACT EXTRACTION, BILATERAL    . CESAREAN SECTION     x1  . CHOLECYSTECTOMY N/A 05/11/2016   Procedure: LAPAROSCOPIC CHOLECYSTECTOMY;  Surgeon:  Florene Glen, MD;  Location: [facility] ORS;  Service: General;  Laterality: N/A;  . COLONOSCOPY WITH PROPOFOL N/A 04/02/2016   Procedure: COLONOSCOPY WITH PROPOFOL;  Surgeon: Jonathon Bellows, MD;  Location: [facility] ENDOSCOPY;  Service: Endoscopy;  Laterality: N/A;  . ENDOSCOPIC RETROGRADE CHOLANGIOPANCREATOGRAPHY (ERCP) WITH PROPOFOL N/A 05/08/2016   Procedure: ENDOSCOPIC RETROGRADE CHOLANGIOPANCREATOGRAPHY (ERCP) WITH PROPOFOL;  Surgeon: Lucilla Lame, MD;  Location: [facility] ENDOSCOPY;  Service: Endoscopy;  Laterality: N/A;  . ERCP      with biliary spincterotomy 05/08/16 Dr. Allen Norris for choledocholithiasis   . ESOPHAGEAL DILATION  04/02/2016   Procedure: ESOPHAGEAL DILATION;  Surgeon: Jonathon Bellows, MD;  Location: [facility] ENDOSCOPY;  Service: Endoscopy;;  . ESOPHAGOGASTRODUODENOSCOPY (EGD) WITH PROPOFOL N/A 04/02/2016   Procedure: ESOPHAGOGASTRODUODENOSCOPY (EGD) WITH PROPOFOL;  Surgeon: Jonathon Bellows, MD;  Location: [facility] END[facility]COPY;  Service: Endoscopy;  Laterality: N/A;  . HIP ARTHROPLASTY Right 11/01/2016   Procedure: ARTHROPLASTY BIPOLAR HIP (HEMIARTHROPLASTY);  Surgeon: Corky Mull, MD;  Location: [facility] ORS;  Service: Orthopedics;  Laterality: Right;  . 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 ORIF SCAPHOID FRACTURE Left 12/21/2019   Procedure: OPEN REDUCTION INTERNAL FIXATION (ORIF) OF LEFT SCAPULAR NONUNION WITH BONE GRAFT;  Surgeon: Corky Mull, MD;  Location: [facility] ORS;  Service: Orthopedics;  Laterality: Left;  . osteoporosis     noted DEXA 08/19/16   . REPLACEMENT TOTAL KNEE Right   . REVERSE SHOULDER ARTHROPLASTY Right 11/04/2017   Procedure: REVERSE SHOULDER ARTHROPLASTY;  Surgeon: Corky Mull, MD;  Location: [facility] OR[facility];  Service: Orthopedics;  Laterality: Right;  . REVERSE SHOULDER ARTHROPLASTY Left 07/26/2018   Procedure: REVERSE SHOULDER ARTHROPLASTY;  Surgeon: Corky Mull, MD;  Location: [facility] OR[facility];  Service: Orthopedics;  Laterality: Left;  . TONSILLECTOMY    . TOTAL HIP ARTHROPLASTY  12/10/11   [facility] left hip  . TOTAL HIP ARTHROPLASTY Bilateral   . TUBAL LIGATION      Family History  Problem Relation Age of Onset  . Rheum arthritis Mother   . Asthma Mother   . Parkinson's disease Mother   . Heart disease Mother   . Stroke Mother   . Hypertension Mother   . Heart attack Father   . Heart disease Father   . Hypertension Father   . Peripheral Artery Disease Father   . Diabetes Son   . Gout Son   . Asthma Sister   . Heart disease Sister   . Lung cancer Sister   .  Heart disease Sister   . Heart disease Sister   . Breast cancer Sister   . Heart attack Sister   . Heart disease Brother   . Heart disease Maternal Grandmother   . Diabetes Maternal Grandmother   . Colon cancer Maternal Grandmother   . Cancer Maternal Grandmother        Hodgkins lymphoma  . Heart disease Brother   . Alcohol abuse Brother   . Depression Brother   . Dementia Son     Social History   Socioeconomic History  . Marital status: Widowed    Spouse name: richard  . Number of children: 2  . Years of education: Some Coll  . Highest education level: Some college, no degree  Occupational History  . Occupation: Retired    Comment: retired  Tobacco Use  . Smoking status: Former Smoker    Packs/day: 0.50    Years: 20.00    Pack years: 10.00  Types: Cigarettes    Quit date: 02/02/1974    Years since quitting: 46.3  . Smokeless tobacco: Never Used  Vaping Use  . Vaping Use: Never used  Substance and Sexual Activity  . Alcohol use: No  . Drug use: No  . Sexual activity: Not Currently  Other Topics Concern  . Not on file  Social History Narrative   Lives at home in [place] grandson, his wife and their child.   Husband Gayna Braddy died covid 52 late 1/207mrried x 545years   Right-handed.   6 cups coffee per day.   Social Determinants of Health   Financial Resource Strain: Low Risk   . Difficulty of Paying Living Expenses: Not hard at all  Food Insecurity: No Food Insecurity  . Worried About RCharity fundraiserin the Last Year: Never true  . Ran Out of Food in the Last Year: Never true  Transportation Needs: No Transportation Needs  . Lack of Transportation (Medical): No  . Lack of Transportation (Non-Medical): No  Physical Activity: Inactive  . Days of Exercise per Week: 0 days  . Minutes of Exercise per Session: 0 min  Stress: No Stress Concern Present  . Feeling of Stress : Not at all  Social Connections: Not on file  Intimate Partner Violence:  Not on file    Current Outpatient Medications on File Prior to Visit  Medication Sig Dispense Refill  . Adalimumab 40 MG/0.4ML PNKT Inject 40 mg into the skin every 14 (fourteen) days.     .Marland Kitchenalbuterol (VENTOLIN HFA) 108 (90 Base) MCG/ACT inhaler Inhale 2 puffs into the lungs every 6 (six) hours as needed. (Patient taking differently: Inhale 2 puffs into the lungs every 6 (six) hours as needed for wheezing or shortness of breath.) 1 Inhaler 5  . ALPRAZolam (XANAX) 0.25 MG tablet Take 1 tablet (0.25 mg total) by mouth at bedtime as needed for anxiety. 30 tablet 2  . amLODipine (NORVASC) 2.5 MG tablet Take 2.5 mg by mouth daily.    . benzonatate (TESSALON) 200 MG capsule Take 1 capsule (200 mg total) by mouth 3 (three) times daily as needed for cough. 30 capsule 0  . budesonide (PULMICORT) 0.25 MG/2ML nebulizer solution USE 1 VIAL  IN  NEBULIZER TWICE  DAILY - rinse mouth after treatment 60 mL 11  . Cholecalciferol 50 MCG (2000 UT) CAPS Take 4,000 Units by mouth daily.     .Marland Kitchenescitalopram (LEXAPRO) 10 MG tablet Take 1 tablet (10 mg total) by mouth daily. 90 tablet 0  . etodolac (LODINE) 500 MG tablet Take 500 mg by mouth at bedtime.    . famotidine (PEPCID) 20 MG tablet Take 1 tablet (20 mg total) by mouth daily. Before breakfast or dinner 90 tablet 3  . fluticasone (FLONASE) 50 MCG/ACT nasal spray Place 2 sprays into both nostrils daily. In am    . gabapentin (NEURONTIN) 300 MG capsule Take 2 capsules (600 mg total) by mouth in the morning and at bedtime.    .Marland Kitchenipratropium-albuterol (DUONEB) 0.5-2.5 (3) MG/3ML SOLN Take 3 mLs by nebulization 3 (three) times daily as needed. (Patient taking differently: Take 3 mLs by nebulization 3 (three) times daily as needed (shortness of breath).) 360 mL 3  . lamoTRIgine (LAMICTAL) 100 MG tablet Take 1 tablet (100 mg total) by mouth 2 (two) times daily. 60 tablet 0  . leflunomide (ARAVA) 20 MG tablet Take 1 tablet (20 mg total) by mouth daily.    .Marland Kitchenlovastatin  (  MEVACOR) 20 MG tablet Take 1 tablet (20 mg total) by mouth at bedtime. 90 tablet 3  . montelukast (SINGULAIR) 10 MG tablet Take 1 tablet (10 mg total) by mouth daily. 90 tablet 3  . MYRBETRIQ 50 MG TB24 tablet TAKE 1 TABLET BY MOUTH EVERYDAY AT BEDTIME 30 tablet 11  . nebivolol (BYSTOLIC) 10 MG tablet Take 0.5 tablets (5 mg total) by mouth in the morning and at bedtime.    . pantoprazole (PROTONIX) 40 MG tablet Take 1 tablet (40 mg total) by mouth 2 (two) times daily. 30 minutes before food. Note reduction in frequency 180 tablet 3  . predniSONE (DELTASONE) 5 MG tablet TAKE 1.5 TABLETS (7.5 MG TOTAL) BY MOUTH DAILY WITH BREAKFAST. 45 tablet 2  . QUEtiapine (SEROQUEL) 25 MG tablet Take 1 tablet (25 mg total) by mouth at bedtime. 90 tablet 0  . sucralfate (CARAFATE) 1 g tablet Take 1 tablet (1 g total) by mouth 2 (two) times daily. 180 tablet 3  . dicyclomine (BENTYL) 10 MG capsule TAKE 1 CAPSULE (10 MG TOTAL) BY MOUTH 4 (FOUR) TIMES DAILY - BEFORE MEALS AND AT BEDTIME. (Patient taking differently: Take 20 mg by mouth in the morning and at bedtime. ) 360 capsule 3   No current facility-administered medications on file prior to visit.    Allergies  Allergen Reactions  . Ceftin [Cefuroxime Axetil] Anaphylaxis  . Lisinopril Anaphylaxis  . Sulfa Antibiotics Other (See Comments)    Face swelling  . Sulfasalazine Anaphylaxis  . Morphine Other (See Comments)    Per patient, low blood pressure issues that requires action to raise it back up. Can take small infrequent doses  . Xarelto [Rivaroxaban] Other (See Comments)    Stomach burning, bleeding, and tar in stool  . Adhesive [Tape] Rash    Paper tape and tega derm OK  . Antihistamines, Chlorpheniramine-Type Other (See Comments)    Makes pt hyper  . Antivert [Meclizine Hcl] Other (See Comments)    Bladder will not empty  . Aspirin Other (See Comments)    Sulfasalazine allergy cross reacts  . Contrast Media [Iodinated Diagnostic Agents] Rash     she is able to use betadine scrubs.  [place] [Pseudoephedrine Hcl] Other (See Comments)    Makes pt hyper  . Doxycycline Other (See Comments)    GI upset  . Levaquin [Levofloxacin In D5w] Rash  . Polymyxin B Other (See Comments)    Facial rash  . Tetanus Toxoids Rash and Other (See Comments)    Fever and hot to touch at injection site       Observations/Objective: Today's Vitals   05/21/20 1344  PainSc: 0-No pain   There is no height or weight on file to calculate BMI.  Physical Exam  CBC    Component Value Date/Time   WBC 12.5 (H) 04/25/2020 1210   RBC 4.25 04/25/2020 1210   HGB 12.2 04/25/2020 1210   HGB 11.6 (L) 11/24/2013 1622   HCT 38.9 04/25/2020 1210   HCT 37.0 11/24/2013 1622   PLT 177 04/25/2020 1210   PLT 188 11/24/2013 1622   MCV 91.5 04/25/2020 1210   MCV 85 11/24/2013 1622   MCH 28.7 04/25/2020 1210   MCHC 31.4 04/25/2020 1210   RDW 14.6 04/25/2020 1210   RDW 14.5 11/24/2013 1622   LYMPHSABS 2.2 04/25/2020 1210   LYMPHSABS 1.4 07/22/2013 0549   MONOABS 0.9 04/25/2020 1210   MONOABS 0.8 07/22/2013 0549   EOSABS 0.7 (H) 04/25/2020 1210  EOSABS 0.3 07/22/2013 0549   BASOSABS 0.1 04/25/2020 1210   BASOSABS 0.0 07/22/2013 0549    CMP     Component Value Date/Time   NA 138 03/24/2020 1517   NA 140 11/24/2013 1622   K 4.6 03/24/2020 1517   K 3.7 11/24/2013 1622   CL 103 03/24/2020 1517   CL 105 11/24/2013 1622   CO2 23 03/24/2020 1517   CO2 31 11/24/2013 1622   GLUCOSE 170 (H) 03/24/2020 1517   GLUCOSE 115 (H) 11/24/2013 1622   BUN 22 03/24/2020 1517   BUN 13 11/24/2013 1622   CREATININE 1.52 (H) 03/24/2020 1517   CREATININE 1.27 (H) 08/06/2017 0910   CALCIUM 8.5 (L) 03/24/2020 1517   CALCIUM 9.0 11/24/2013 1622   PROT 7.3 03/24/2020 1517   PROT 7.1 11/24/2013 1622   ALBUMIN 3.7 03/24/2020 1517   ALBUMIN 3.9 11/24/2013 1622   AST 25 03/24/2020 1517   AST 19 11/24/2013 1622   ALT 16 03/24/2020 1517   ALT 17 11/24/2013 1622    ALKPHOS 61 03/24/2020 1517   ALKPHOS 87 11/24/2013 1622   BILITOT 0.8 03/24/2020 1517   BILITOT 0.3 11/24/2013 1622   GFRNONAA 35 (L) 03/24/2020 1517   GFRNONAA 44 (L) 11/24/2013 1622   GFRNONAA 58 (L) 07/22/2013 0549   GFRAA 39 (L) 02/21/2019 1627   GFRAA 54 (L) 11/24/2013 1622   GFRAA >60 07/22/2013 0549     Assessment and Plan: 1. Leukocytosis, unspecified type     Leukocytosis, BC showed a white count of 12.5, predominantly neutrophilia, new eosinophilia. Peripheral smear showed no blast, left shift, probably due to chronic inflammation due to RA, chronic steroid use Normal LDH, multiple myeloma panel is negative for M protein.  Normal light chain ratio Peripheral blood flow cytometry showed no monoclonal B-cell population.  No loss or aberrant expression of pan T-cell antigen to suggest neoplastic T-cell response.  No circulating blasts.  Immature granulocytes Eosinophilia, mild probably reactive.  Rule out other etiologies. I will check myeloproliferative disorder work-up.  JAK2 with reflex, BCR ABL 1 FISH,   Follow Up Instructions: Results are negative, will recommend patient to follow-up in 6 months.   I discussed the assessment and treatment plan with the patient. The patient was provided an opportunity to ask questions and all were answered. The patient agreed with the plan and demonstrated an understanding of the instructions.  The patient was advised to call back or seek an in-person evaluation if the symptoms worsen or if the condition fails to improve as anticipated.   Earlie Server, MD 05/21/2020 8:19 PM

## 2020-05-21 NOTE — Progress Notes (Signed)
Follow-up leukocytosis. No new concerns today.

## 2020-05-22 ENCOUNTER — Other Ambulatory Visit: Payer: Self-pay | Admitting: Internal Medicine

## 2020-05-22 ENCOUNTER — Telehealth: Payer: Self-pay | Admitting: Oncology

## 2020-05-22 ENCOUNTER — Other Ambulatory Visit: Payer: Self-pay

## 2020-05-22 ENCOUNTER — Ambulatory Visit
Admission: RE | Admit: 2020-05-22 | Discharge: 2020-05-22 | Disposition: A | Discharge status: Home / Self Care | Payer: Medicare PPO | Source: Ambulatory Visit | Attending: Internal Medicine | Admitting: Internal Medicine

## 2020-05-22 ENCOUNTER — Inpatient Hospital Stay: Payer: Medicare PPO

## 2020-05-22 DIAGNOSIS — F3176 Bipolar disorder, in full remission, most recent episode depressed: Secondary | ICD-10-CM

## 2020-05-22 DIAGNOSIS — G459 Transient cerebral ischemic attack, unspecified: Secondary | ICD-10-CM | POA: Diagnosis not present

## 2020-05-22 DIAGNOSIS — I10 Essential (primary) hypertension: Secondary | ICD-10-CM | POA: Diagnosis not present

## 2020-05-22 DIAGNOSIS — F419 Anxiety disorder, unspecified: Secondary | ICD-10-CM

## 2020-05-22 DIAGNOSIS — I6523 Occlusion and stenosis of bilateral carotid arteries: Secondary | ICD-10-CM | POA: Diagnosis not present

## 2020-05-22 NOTE — Telephone Encounter (Signed)
Patient called stating that she is recovering from surgery and will not be able to come in to lab appointment today. Rescheduled for 05/28/20.

## 2020-05-23 ENCOUNTER — Other Ambulatory Visit: Payer: Self-pay | Admitting: Internal Medicine

## 2020-05-23 DIAGNOSIS — H353122 Nonexudative age-related macular degeneration, left eye, intermediate dry stage: Secondary | ICD-10-CM | POA: Diagnosis not present

## 2020-05-23 DIAGNOSIS — I872 Venous insufficiency (chronic) (peripheral): Secondary | ICD-10-CM | POA: Diagnosis not present

## 2020-05-23 DIAGNOSIS — N1832 Chronic kidney disease, stage 3b: Secondary | ICD-10-CM | POA: Diagnosis not present

## 2020-05-23 DIAGNOSIS — E1151 Type 2 diabetes mellitus with diabetic peripheral angiopathy without gangrene: Secondary | ICD-10-CM | POA: Diagnosis not present

## 2020-05-23 DIAGNOSIS — E1142 Type 2 diabetes mellitus with diabetic polyneuropathy: Secondary | ICD-10-CM | POA: Diagnosis not present

## 2020-05-23 DIAGNOSIS — H353211 Exudative age-related macular degeneration, right eye, with active choroidal neovascularization: Secondary | ICD-10-CM | POA: Diagnosis not present

## 2020-05-23 DIAGNOSIS — E1122 Type 2 diabetes mellitus with diabetic chronic kidney disease: Secondary | ICD-10-CM | POA: Diagnosis not present

## 2020-05-23 DIAGNOSIS — S42122D Displaced fracture of acromial process, left shoulder, subsequent encounter for fracture with routine healing: Secondary | ICD-10-CM | POA: Diagnosis not present

## 2020-05-23 DIAGNOSIS — H43813 Vitreous degeneration, bilateral: Secondary | ICD-10-CM | POA: Diagnosis not present

## 2020-05-23 DIAGNOSIS — I129 Hypertensive chronic kidney disease with stage 1 through stage 4 chronic kidney disease, or unspecified chronic kidney disease: Secondary | ICD-10-CM | POA: Diagnosis not present

## 2020-05-23 DIAGNOSIS — K219 Gastro-esophageal reflux disease without esophagitis: Secondary | ICD-10-CM

## 2020-05-23 DIAGNOSIS — Z961 Presence of intraocular lens: Secondary | ICD-10-CM | POA: Diagnosis not present

## 2020-05-23 DIAGNOSIS — J449 Chronic obstructive pulmonary disease, unspecified: Secondary | ICD-10-CM | POA: Diagnosis not present

## 2020-05-23 DIAGNOSIS — D3131 Benign neoplasm of right choroid: Secondary | ICD-10-CM | POA: Diagnosis not present

## 2020-05-23 DIAGNOSIS — D631 Anemia in chronic kidney disease: Secondary | ICD-10-CM | POA: Diagnosis not present

## 2020-05-24 ENCOUNTER — Inpatient Hospital Stay: Payer: Medicare PPO

## 2020-05-28 ENCOUNTER — Inpatient Hospital Stay: Payer: Medicare PPO

## 2020-05-28 DIAGNOSIS — D72829 Elevated white blood cell count, unspecified: Secondary | ICD-10-CM

## 2020-05-29 DIAGNOSIS — E1142 Type 2 diabetes mellitus with diabetic polyneuropathy: Secondary | ICD-10-CM | POA: Diagnosis not present

## 2020-05-29 DIAGNOSIS — E1122 Type 2 diabetes mellitus with diabetic chronic kidney disease: Secondary | ICD-10-CM | POA: Diagnosis not present

## 2020-05-29 DIAGNOSIS — S42122D Displaced fracture of acromial process, left shoulder, subsequent encounter for fracture with routine healing: Secondary | ICD-10-CM | POA: Diagnosis not present

## 2020-05-29 DIAGNOSIS — I129 Hypertensive chronic kidney disease with stage 1 through stage 4 chronic kidney disease, or unspecified chronic kidney disease: Secondary | ICD-10-CM | POA: Diagnosis not present

## 2020-05-29 DIAGNOSIS — J449 Chronic obstructive pulmonary disease, unspecified: Secondary | ICD-10-CM | POA: Diagnosis not present

## 2020-05-29 DIAGNOSIS — D631 Anemia in chronic kidney disease: Secondary | ICD-10-CM | POA: Diagnosis not present

## 2020-05-29 DIAGNOSIS — N1832 Chronic kidney disease, stage 3b: Secondary | ICD-10-CM | POA: Diagnosis not present

## 2020-05-29 DIAGNOSIS — E1151 Type 2 diabetes mellitus with diabetic peripheral angiopathy without gangrene: Secondary | ICD-10-CM | POA: Diagnosis not present

## 2020-05-29 DIAGNOSIS — I872 Venous insufficiency (chronic) (peripheral): Secondary | ICD-10-CM | POA: Diagnosis not present

## 2020-05-30 ENCOUNTER — Other Ambulatory Visit: Payer: Self-pay | Admitting: Neurosurgery

## 2020-05-30 DIAGNOSIS — E1122 Type 2 diabetes mellitus with diabetic chronic kidney disease: Secondary | ICD-10-CM | POA: Diagnosis not present

## 2020-05-30 DIAGNOSIS — G8929 Other chronic pain: Secondary | ICD-10-CM

## 2020-05-30 DIAGNOSIS — M5441 Lumbago with sciatica, right side: Secondary | ICD-10-CM

## 2020-05-30 DIAGNOSIS — E1151 Type 2 diabetes mellitus with diabetic peripheral angiopathy without gangrene: Secondary | ICD-10-CM | POA: Diagnosis not present

## 2020-05-30 DIAGNOSIS — N1832 Chronic kidney disease, stage 3b: Secondary | ICD-10-CM | POA: Diagnosis not present

## 2020-05-30 DIAGNOSIS — D631 Anemia in chronic kidney disease: Secondary | ICD-10-CM | POA: Diagnosis not present

## 2020-05-30 DIAGNOSIS — J449 Chronic obstructive pulmonary disease, unspecified: Secondary | ICD-10-CM | POA: Diagnosis not present

## 2020-05-30 DIAGNOSIS — M5442 Lumbago with sciatica, left side: Secondary | ICD-10-CM | POA: Diagnosis not present

## 2020-05-30 DIAGNOSIS — I129 Hypertensive chronic kidney disease with stage 1 through stage 4 chronic kidney disease, or unspecified chronic kidney disease: Secondary | ICD-10-CM | POA: Diagnosis not present

## 2020-05-30 DIAGNOSIS — S42122D Displaced fracture of acromial process, left shoulder, subsequent encounter for fracture with routine healing: Secondary | ICD-10-CM | POA: Diagnosis not present

## 2020-05-30 DIAGNOSIS — I872 Venous insufficiency (chronic) (peripheral): Secondary | ICD-10-CM | POA: Diagnosis not present

## 2020-05-30 DIAGNOSIS — Z981 Arthrodesis status: Secondary | ICD-10-CM | POA: Diagnosis not present

## 2020-05-30 DIAGNOSIS — E1142 Type 2 diabetes mellitus with diabetic polyneuropathy: Secondary | ICD-10-CM | POA: Diagnosis not present

## 2020-06-03 LAB — BCR-ABL1 FISH
Cells Analyzed: 200
Cells Counted: 200

## 2020-06-04 DIAGNOSIS — N83292 Other ovarian cyst, left side: Secondary | ICD-10-CM | POA: Diagnosis not present

## 2020-06-04 DIAGNOSIS — R1904 Left lower quadrant abdominal swelling, mass and lump: Secondary | ICD-10-CM | POA: Diagnosis not present

## 2020-06-04 DIAGNOSIS — N83202 Unspecified ovarian cyst, left side: Secondary | ICD-10-CM | POA: Diagnosis not present

## 2020-06-05 DIAGNOSIS — J449 Chronic obstructive pulmonary disease, unspecified: Secondary | ICD-10-CM | POA: Diagnosis not present

## 2020-06-05 DIAGNOSIS — E1142 Type 2 diabetes mellitus with diabetic polyneuropathy: Secondary | ICD-10-CM | POA: Diagnosis not present

## 2020-06-05 DIAGNOSIS — D631 Anemia in chronic kidney disease: Secondary | ICD-10-CM | POA: Diagnosis not present

## 2020-06-05 DIAGNOSIS — N1832 Chronic kidney disease, stage 3b: Secondary | ICD-10-CM | POA: Diagnosis not present

## 2020-06-05 DIAGNOSIS — E1151 Type 2 diabetes mellitus with diabetic peripheral angiopathy without gangrene: Secondary | ICD-10-CM | POA: Diagnosis not present

## 2020-06-05 DIAGNOSIS — S42122D Displaced fracture of acromial process, left shoulder, subsequent encounter for fracture with routine healing: Secondary | ICD-10-CM | POA: Diagnosis not present

## 2020-06-05 DIAGNOSIS — I872 Venous insufficiency (chronic) (peripheral): Secondary | ICD-10-CM | POA: Diagnosis not present

## 2020-06-05 DIAGNOSIS — I129 Hypertensive chronic kidney disease with stage 1 through stage 4 chronic kidney disease, or unspecified chronic kidney disease: Secondary | ICD-10-CM | POA: Diagnosis not present

## 2020-06-05 DIAGNOSIS — E1122 Type 2 diabetes mellitus with diabetic chronic kidney disease: Secondary | ICD-10-CM | POA: Diagnosis not present

## 2020-06-06 DIAGNOSIS — D631 Anemia in chronic kidney disease: Secondary | ICD-10-CM | POA: Diagnosis not present

## 2020-06-06 DIAGNOSIS — E1151 Type 2 diabetes mellitus with diabetic peripheral angiopathy without gangrene: Secondary | ICD-10-CM | POA: Diagnosis not present

## 2020-06-06 DIAGNOSIS — N1832 Chronic kidney disease, stage 3b: Secondary | ICD-10-CM | POA: Diagnosis not present

## 2020-06-06 DIAGNOSIS — E1122 Type 2 diabetes mellitus with diabetic chronic kidney disease: Secondary | ICD-10-CM | POA: Diagnosis not present

## 2020-06-06 DIAGNOSIS — I129 Hypertensive chronic kidney disease with stage 1 through stage 4 chronic kidney disease, or unspecified chronic kidney disease: Secondary | ICD-10-CM | POA: Diagnosis not present

## 2020-06-06 DIAGNOSIS — S42122D Displaced fracture of acromial process, left shoulder, subsequent encounter for fracture with routine healing: Secondary | ICD-10-CM | POA: Diagnosis not present

## 2020-06-06 DIAGNOSIS — J449 Chronic obstructive pulmonary disease, unspecified: Secondary | ICD-10-CM | POA: Diagnosis not present

## 2020-06-06 DIAGNOSIS — I872 Venous insufficiency (chronic) (peripheral): Secondary | ICD-10-CM | POA: Diagnosis not present

## 2020-06-06 DIAGNOSIS — E1142 Type 2 diabetes mellitus with diabetic polyneuropathy: Secondary | ICD-10-CM | POA: Diagnosis not present

## 2020-06-08 ENCOUNTER — Other Ambulatory Visit: Payer: Self-pay | Admitting: Psychiatry

## 2020-06-08 DIAGNOSIS — F419 Anxiety disorder, unspecified: Secondary | ICD-10-CM

## 2020-06-08 DIAGNOSIS — F3176 Bipolar disorder, in full remission, most recent episode depressed: Secondary | ICD-10-CM

## 2020-06-10 ENCOUNTER — Ambulatory Visit: Payer: Medicare PPO | Admitting: Pulmonary Disease

## 2020-06-10 DIAGNOSIS — M1712 Unilateral primary osteoarthritis, left knee: Secondary | ICD-10-CM | POA: Diagnosis not present

## 2020-06-10 DIAGNOSIS — M25562 Pain in left knee: Secondary | ICD-10-CM | POA: Diagnosis not present

## 2020-06-11 ENCOUNTER — Telehealth: Payer: Self-pay | Admitting: Pulmonary Disease

## 2020-06-11 NOTE — Telephone Encounter (Signed)
Make sure she is using her Pulmicort via nebulizer.  She may decrease her prednisone to 5 mg daily.

## 2020-06-11 NOTE — Telephone Encounter (Signed)
Spoke to patient, who is requesting an appt with Dr. Patsey Berthold. She prefers to see Dr. Patsey Berthold only. Patient is due for 15mo rov in June. Dr. Patsey Berthold does not have any availability in June.  She is concerned about her prednisone dosage, as she is experiencing weight gain . She is currently on 7.5mg . She is questioning if she can reduce prednisone to 5mg . C/o persistent Prod cough with thick pale yellow sputum and sob with exertion.  Dr. Patsey Berthold, please advise. Thanks

## 2020-06-11 NOTE — Telephone Encounter (Signed)
Patient is aware of below message and voiced her understanding.  Patient confirmed that she is using pulmicort BID and albuterol PRN.  Recall paced for July with Dr. Patsey Berthold Nothing further needed at this time.

## 2020-06-12 DIAGNOSIS — N1832 Chronic kidney disease, stage 3b: Secondary | ICD-10-CM | POA: Diagnosis not present

## 2020-06-12 DIAGNOSIS — D631 Anemia in chronic kidney disease: Secondary | ICD-10-CM | POA: Diagnosis not present

## 2020-06-12 DIAGNOSIS — E1142 Type 2 diabetes mellitus with diabetic polyneuropathy: Secondary | ICD-10-CM | POA: Diagnosis not present

## 2020-06-12 DIAGNOSIS — G459 Transient cerebral ischemic attack, unspecified: Secondary | ICD-10-CM | POA: Diagnosis not present

## 2020-06-12 DIAGNOSIS — E1122 Type 2 diabetes mellitus with diabetic chronic kidney disease: Secondary | ICD-10-CM | POA: Diagnosis not present

## 2020-06-12 DIAGNOSIS — Z113 Encounter for screening for infections with a predominantly sexual mode of transmission: Secondary | ICD-10-CM | POA: Diagnosis not present

## 2020-06-12 DIAGNOSIS — E519 Thiamine deficiency, unspecified: Secondary | ICD-10-CM | POA: Diagnosis not present

## 2020-06-12 DIAGNOSIS — J449 Chronic obstructive pulmonary disease, unspecified: Secondary | ICD-10-CM | POA: Diagnosis not present

## 2020-06-12 DIAGNOSIS — I872 Venous insufficiency (chronic) (peripheral): Secondary | ICD-10-CM | POA: Diagnosis not present

## 2020-06-12 DIAGNOSIS — E538 Deficiency of other specified B group vitamins: Secondary | ICD-10-CM | POA: Diagnosis not present

## 2020-06-12 DIAGNOSIS — S42122D Displaced fracture of acromial process, left shoulder, subsequent encounter for fracture with routine healing: Secondary | ICD-10-CM | POA: Diagnosis not present

## 2020-06-12 DIAGNOSIS — A539 Syphilis, unspecified: Secondary | ICD-10-CM | POA: Diagnosis not present

## 2020-06-12 DIAGNOSIS — E1151 Type 2 diabetes mellitus with diabetic peripheral angiopathy without gangrene: Secondary | ICD-10-CM | POA: Diagnosis not present

## 2020-06-12 DIAGNOSIS — I129 Hypertensive chronic kidney disease with stage 1 through stage 4 chronic kidney disease, or unspecified chronic kidney disease: Secondary | ICD-10-CM | POA: Diagnosis not present

## 2020-06-13 ENCOUNTER — Ambulatory Visit: Payer: Medicare PPO | Admitting: Pulmonary Disease

## 2020-06-14 DIAGNOSIS — E1122 Type 2 diabetes mellitus with diabetic chronic kidney disease: Secondary | ICD-10-CM | POA: Diagnosis not present

## 2020-06-14 DIAGNOSIS — D631 Anemia in chronic kidney disease: Secondary | ICD-10-CM | POA: Diagnosis not present

## 2020-06-14 DIAGNOSIS — S42122D Displaced fracture of acromial process, left shoulder, subsequent encounter for fracture with routine healing: Secondary | ICD-10-CM | POA: Diagnosis not present

## 2020-06-14 DIAGNOSIS — E1142 Type 2 diabetes mellitus with diabetic polyneuropathy: Secondary | ICD-10-CM | POA: Diagnosis not present

## 2020-06-14 DIAGNOSIS — E1151 Type 2 diabetes mellitus with diabetic peripheral angiopathy without gangrene: Secondary | ICD-10-CM | POA: Diagnosis not present

## 2020-06-14 DIAGNOSIS — I129 Hypertensive chronic kidney disease with stage 1 through stage 4 chronic kidney disease, or unspecified chronic kidney disease: Secondary | ICD-10-CM | POA: Diagnosis not present

## 2020-06-14 DIAGNOSIS — I872 Venous insufficiency (chronic) (peripheral): Secondary | ICD-10-CM | POA: Diagnosis not present

## 2020-06-14 DIAGNOSIS — N1832 Chronic kidney disease, stage 3b: Secondary | ICD-10-CM | POA: Diagnosis not present

## 2020-06-14 DIAGNOSIS — J449 Chronic obstructive pulmonary disease, unspecified: Secondary | ICD-10-CM | POA: Diagnosis not present

## 2020-06-18 LAB — JAK2 V617F, W REFLEX TO CALR/E12/MPL

## 2020-06-18 LAB — CALR + JAK2 E12-15 + MPL (REFLEXED)

## 2020-06-19 DIAGNOSIS — E1122 Type 2 diabetes mellitus with diabetic chronic kidney disease: Secondary | ICD-10-CM | POA: Diagnosis not present

## 2020-06-19 DIAGNOSIS — D631 Anemia in chronic kidney disease: Secondary | ICD-10-CM | POA: Diagnosis not present

## 2020-06-19 DIAGNOSIS — S42122D Displaced fracture of acromial process, left shoulder, subsequent encounter for fracture with routine healing: Secondary | ICD-10-CM | POA: Diagnosis not present

## 2020-06-19 DIAGNOSIS — I129 Hypertensive chronic kidney disease with stage 1 through stage 4 chronic kidney disease, or unspecified chronic kidney disease: Secondary | ICD-10-CM | POA: Diagnosis not present

## 2020-06-19 DIAGNOSIS — N1832 Chronic kidney disease, stage 3b: Secondary | ICD-10-CM | POA: Diagnosis not present

## 2020-06-19 DIAGNOSIS — J449 Chronic obstructive pulmonary disease, unspecified: Secondary | ICD-10-CM | POA: Diagnosis not present

## 2020-06-19 DIAGNOSIS — E1151 Type 2 diabetes mellitus with diabetic peripheral angiopathy without gangrene: Secondary | ICD-10-CM | POA: Diagnosis not present

## 2020-06-19 DIAGNOSIS — E1142 Type 2 diabetes mellitus with diabetic polyneuropathy: Secondary | ICD-10-CM | POA: Diagnosis not present

## 2020-06-19 DIAGNOSIS — I872 Venous insufficiency (chronic) (peripheral): Secondary | ICD-10-CM | POA: Diagnosis not present

## 2020-06-20 ENCOUNTER — Other Ambulatory Visit: Payer: Self-pay | Admitting: Psychiatry

## 2020-06-20 ENCOUNTER — Other Ambulatory Visit: Payer: Self-pay | Admitting: Internal Medicine

## 2020-06-20 DIAGNOSIS — F3176 Bipolar disorder, in full remission, most recent episode depressed: Secondary | ICD-10-CM

## 2020-06-20 DIAGNOSIS — G4700 Insomnia, unspecified: Secondary | ICD-10-CM

## 2020-06-20 DIAGNOSIS — F419 Anxiety disorder, unspecified: Secondary | ICD-10-CM

## 2020-06-20 NOTE — Telephone Encounter (Signed)
RX Refill:xanax Last Seen:04-02-20 Last ordered:02-13-20

## 2020-06-21 ENCOUNTER — Telehealth: Payer: Self-pay

## 2020-06-21 ENCOUNTER — Other Ambulatory Visit: Payer: Self-pay | Admitting: Internal Medicine

## 2020-06-21 DIAGNOSIS — F3176 Bipolar disorder, in full remission, most recent episode depressed: Secondary | ICD-10-CM

## 2020-06-21 DIAGNOSIS — F419 Anxiety disorder, unspecified: Secondary | ICD-10-CM

## 2020-06-21 MED ORDER — QUETIAPINE FUMARATE 25 MG PO TABS: 25.0000 mg | ORAL_TABLET | Freq: Every day | ORAL | 0 refills | Status: DC

## 2020-06-21 NOTE — Telephone Encounter (Signed)
Patient states she she can not make it to [place]. She will contact her insurance to find a psychiatrist that will take her insurance and call back for a new referral.

## 2020-06-21 NOTE — Telephone Encounter (Signed)
Dr Olivia Mackie McLean-Scocuzza reached out to psych for Patient. According to Dr Shea Evans:  I have never evaluated this patient , she was dismissed since she kept cancelling or no showed I have given refills as a courtesy since she was transferring from [place] , in the past , but will not be able to do it anymore   According to Dr Olivia Mackie McLean-Scocuzza: pt dismissed psych she needs a new one in [place] is she agreeable?

## 2020-06-21 NOTE — Telephone Encounter (Signed)
Pt called and states that her therapist has not returned her call since yesterday. She states that she left a message trying to get her QUEtiapine (SEROQUEL) 25 MG tablet. She states that she did not have one last night and was up all night. She wants to know if Dr [facility] would give her enough until that Dr calls her back. Please advise

## 2020-06-21 NOTE — Telephone Encounter (Signed)
Please advise 

## 2020-06-25 ENCOUNTER — Ambulatory Visit: Payer: Medicare PPO

## 2020-06-25 ENCOUNTER — Telehealth: Payer: Self-pay

## 2020-06-25 DIAGNOSIS — E1142 Type 2 diabetes mellitus with diabetic polyneuropathy: Secondary | ICD-10-CM | POA: Diagnosis not present

## 2020-06-25 DIAGNOSIS — N1832 Chronic kidney disease, stage 3b: Secondary | ICD-10-CM | POA: Diagnosis not present

## 2020-06-25 DIAGNOSIS — E1122 Type 2 diabetes mellitus with diabetic chronic kidney disease: Secondary | ICD-10-CM | POA: Diagnosis not present

## 2020-06-25 DIAGNOSIS — D631 Anemia in chronic kidney disease: Secondary | ICD-10-CM | POA: Diagnosis not present

## 2020-06-25 DIAGNOSIS — E1151 Type 2 diabetes mellitus with diabetic peripheral angiopathy without gangrene: Secondary | ICD-10-CM | POA: Diagnosis not present

## 2020-06-25 DIAGNOSIS — D72829 Elevated white blood cell count, unspecified: Secondary | ICD-10-CM

## 2020-06-25 DIAGNOSIS — S42122D Displaced fracture of acromial process, left shoulder, subsequent encounter for fracture with routine healing: Secondary | ICD-10-CM | POA: Diagnosis not present

## 2020-06-25 DIAGNOSIS — I129 Hypertensive chronic kidney disease with stage 1 through stage 4 chronic kidney disease, or unspecified chronic kidney disease: Secondary | ICD-10-CM | POA: Diagnosis not present

## 2020-06-25 DIAGNOSIS — J449 Chronic obstructive pulmonary disease, unspecified: Secondary | ICD-10-CM | POA: Diagnosis not present

## 2020-06-25 DIAGNOSIS — I872 Venous insufficiency (chronic) (peripheral): Secondary | ICD-10-CM | POA: Diagnosis not present

## 2020-06-25 NOTE — Telephone Encounter (Signed)
-----   Message from Earlie Server, MD sent at 06/24/2020 11:50 PM EDT ----- Let her know that blood work is negative. Recommend 6 months follow up cbc cmp

## 2020-06-25 NOTE — Telephone Encounter (Signed)
MyChart message sent informing patient of MD recommendations.  Will send a schedule pool message.

## 2020-06-26 DIAGNOSIS — J449 Chronic obstructive pulmonary disease, unspecified: Secondary | ICD-10-CM | POA: Diagnosis not present

## 2020-06-26 DIAGNOSIS — E1142 Type 2 diabetes mellitus with diabetic polyneuropathy: Secondary | ICD-10-CM | POA: Diagnosis not present

## 2020-06-26 DIAGNOSIS — I872 Venous insufficiency (chronic) (peripheral): Secondary | ICD-10-CM | POA: Diagnosis not present

## 2020-06-26 DIAGNOSIS — S42122D Displaced fracture of acromial process, left shoulder, subsequent encounter for fracture with routine healing: Secondary | ICD-10-CM | POA: Diagnosis not present

## 2020-06-26 DIAGNOSIS — D631 Anemia in chronic kidney disease: Secondary | ICD-10-CM | POA: Diagnosis not present

## 2020-06-26 DIAGNOSIS — N1832 Chronic kidney disease, stage 3b: Secondary | ICD-10-CM | POA: Diagnosis not present

## 2020-06-26 DIAGNOSIS — E1151 Type 2 diabetes mellitus with diabetic peripheral angiopathy without gangrene: Secondary | ICD-10-CM | POA: Diagnosis not present

## 2020-06-26 DIAGNOSIS — E1122 Type 2 diabetes mellitus with diabetic chronic kidney disease: Secondary | ICD-10-CM | POA: Diagnosis not present

## 2020-06-26 DIAGNOSIS — I129 Hypertensive chronic kidney disease with stage 1 through stage 4 chronic kidney disease, or unspecified chronic kidney disease: Secondary | ICD-10-CM | POA: Diagnosis not present

## 2020-06-26 NOTE — Telephone Encounter (Signed)
Patient has not viewed MyChart message.  Called and left a message on voicemail with MD recommendations.

## 2020-06-27 ENCOUNTER — Ambulatory Visit: Payer: Medicare PPO | Admitting: Podiatry

## 2020-06-29 ENCOUNTER — Other Ambulatory Visit: Payer: Self-pay | Admitting: Psychiatry

## 2020-06-29 ENCOUNTER — Other Ambulatory Visit: Payer: Self-pay | Admitting: Internal Medicine

## 2020-06-29 DIAGNOSIS — K219 Gastro-esophageal reflux disease without esophagitis: Secondary | ICD-10-CM

## 2020-06-29 DIAGNOSIS — F419 Anxiety disorder, unspecified: Secondary | ICD-10-CM

## 2020-06-29 DIAGNOSIS — F3176 Bipolar disorder, in full remission, most recent episode depressed: Secondary | ICD-10-CM

## 2020-07-05 ENCOUNTER — Ambulatory Visit: Payer: Medicare PPO

## 2020-07-06 ENCOUNTER — Other Ambulatory Visit: Payer: Self-pay | Admitting: Pulmonary Disease

## 2020-07-08 ENCOUNTER — Telehealth: Payer: Self-pay | Admitting: Internal Medicine

## 2020-07-08 ENCOUNTER — Other Ambulatory Visit: Payer: Self-pay | Admitting: Internal Medicine

## 2020-07-08 DIAGNOSIS — F419 Anxiety disorder, unspecified: Secondary | ICD-10-CM

## 2020-07-08 DIAGNOSIS — F3176 Bipolar disorder, in full remission, most recent episode depressed: Secondary | ICD-10-CM

## 2020-07-08 MED ORDER — LAMOTRIGINE 100 MG PO TABS: 100.0000 mg | ORAL_TABLET | Freq: Two times a day (BID) | ORAL | 0 refills | Status: DC

## 2020-07-08 NOTE — Addendum Note (Signed)
Addended by: Orland Mustard on: 07/08/2020 01:26 PM   Modules accepted: Orders

## 2020-07-08 NOTE — Telephone Encounter (Signed)
The patient is requesting a refill on her Lamictal 100 mg . She is needing this medication filled before Wednesday 07/10/2020. She will be going out of town . She stated she needed this medication for her conversion disorder.

## 2020-07-08 NOTE — Telephone Encounter (Signed)
Mychart sent to inform patient.

## 2020-07-08 NOTE — Telephone Encounter (Addendum)
She needs a new psychiatrist I dont fill lamictal  Will do short term supply and refer to psych since was dismissed from prior  Will have to go to [place]  Pt can call and schedule her own psychiatry appt before  [facility] counseling and psychiatry [facility]  [place] [phone]   [facility] [place]  [address]  [place] [address]  [phone]

## 2020-07-08 NOTE — Telephone Encounter (Signed)
Patients referral was sent to the wrong place, per [facility] .

## 2020-07-11 ENCOUNTER — Encounter: Payer: Self-pay | Admitting: Internal Medicine

## 2020-07-11 ENCOUNTER — Telehealth: Payer: Self-pay | Admitting: Internal Medicine

## 2020-07-11 NOTE — Telephone Encounter (Signed)
For letters outside of a visit if she is agreeable to my chart telephone fee yes will write  Also if she is/was sick calling before to get a letter instead of after or having a physical/virtual visit is recommended as well   Is she agreeable to phone fee for letter?

## 2020-07-11 NOTE — Telephone Encounter (Signed)
Letter is ready please call patient to see where she needs this faxed she gave is [phone] and an email we cant email  Thank you  Dr. Olivia Mackie

## 2020-07-11 NOTE — Telephone Encounter (Signed)
PT called into the office today to see if Dr.TMS could fax over a letter stating that she was sick and could not fly on June 8th. PT would like to have it fax to [phone] that way she can send it to the airline who states that they need a letter stating that for a refund. She had a headache cough and diarrhea. She would like to be called if so that way she can turn the fax machine on.

## 2020-07-11 NOTE — Telephone Encounter (Signed)
I called and spoke with the patient and she is okay with the charge for the letter.  When it is done she would like it faxed to her nephew at [email or web address].  Seneca Hoback,cma

## 2020-07-12 ENCOUNTER — Other Ambulatory Visit: Payer: Self-pay | Admitting: Internal Medicine

## 2020-07-12 DIAGNOSIS — E785 Hyperlipidemia, unspecified: Secondary | ICD-10-CM

## 2020-07-12 NOTE — Telephone Encounter (Signed)
Letter was emailed to email below per request as well

## 2020-07-12 NOTE — Telephone Encounter (Signed)
Letter has been faxed.

## 2020-07-26 ENCOUNTER — Ambulatory Visit
Admission: RE | Admit: 2020-07-26 | Discharge: 2020-07-26 | Disposition: A | Discharge status: Home / Self Care | Payer: Medicare PPO | Source: Ambulatory Visit | Attending: Neurosurgery | Admitting: Neurosurgery

## 2020-07-26 ENCOUNTER — Other Ambulatory Visit: Payer: Self-pay

## 2020-07-26 DIAGNOSIS — G8929 Other chronic pain: Secondary | ICD-10-CM | POA: Diagnosis not present

## 2020-07-26 DIAGNOSIS — M5442 Lumbago with sciatica, left side: Secondary | ICD-10-CM | POA: Insufficient documentation

## 2020-07-26 DIAGNOSIS — M5441 Lumbago with sciatica, right side: Secondary | ICD-10-CM | POA: Diagnosis not present

## 2020-07-26 DIAGNOSIS — Z981 Arthrodesis status: Secondary | ICD-10-CM

## 2020-07-26 DIAGNOSIS — M545 Low back pain, unspecified: Secondary | ICD-10-CM | POA: Diagnosis not present

## 2020-07-29 ENCOUNTER — Other Ambulatory Visit: Payer: Self-pay | Admitting: Pulmonary Disease

## 2020-07-29 DIAGNOSIS — H43813 Vitreous degeneration, bilateral: Secondary | ICD-10-CM | POA: Diagnosis not present

## 2020-07-29 DIAGNOSIS — H353122 Nonexudative age-related macular degeneration, left eye, intermediate dry stage: Secondary | ICD-10-CM | POA: Diagnosis not present

## 2020-07-29 DIAGNOSIS — H353211 Exudative age-related macular degeneration, right eye, with active choroidal neovascularization: Secondary | ICD-10-CM | POA: Diagnosis not present

## 2020-07-29 DIAGNOSIS — D3131 Benign neoplasm of right choroid: Secondary | ICD-10-CM | POA: Diagnosis not present

## 2020-07-29 DIAGNOSIS — Z961 Presence of intraocular lens: Secondary | ICD-10-CM | POA: Diagnosis not present

## 2020-07-30 ENCOUNTER — Other Ambulatory Visit: Payer: Self-pay | Admitting: Internal Medicine

## 2020-07-30 DIAGNOSIS — F419 Anxiety disorder, unspecified: Secondary | ICD-10-CM

## 2020-07-30 DIAGNOSIS — F3176 Bipolar disorder, in full remission, most recent episode depressed: Secondary | ICD-10-CM

## 2020-08-01 ENCOUNTER — Ambulatory Visit: Payer: Medicare PPO | Admitting: Podiatry

## 2020-08-02 DIAGNOSIS — Z96612 Presence of left artificial shoulder joint: Secondary | ICD-10-CM | POA: Diagnosis not present

## 2020-08-02 DIAGNOSIS — S42122K Displaced fracture of acromial process, left shoulder, subsequent encounter for fracture with nonunion: Secondary | ICD-10-CM | POA: Diagnosis not present

## 2020-08-06 ENCOUNTER — Other Ambulatory Visit: Payer: Self-pay

## 2020-08-06 ENCOUNTER — Ambulatory Visit
Admission: RE | Admit: 2020-08-06 | Discharge: 2020-08-06 | Disposition: A | Discharge status: Home / Self Care | Payer: Medicare PPO | Source: Ambulatory Visit | Attending: Internal Medicine | Admitting: Internal Medicine

## 2020-08-06 DIAGNOSIS — Z1231 Encounter for screening mammogram for malignant neoplasm of breast: Secondary | ICD-10-CM | POA: Diagnosis not present

## 2020-08-06 DIAGNOSIS — J449 Chronic obstructive pulmonary disease, unspecified: Secondary | ICD-10-CM | POA: Diagnosis not present

## 2020-08-07 ENCOUNTER — Telehealth: Payer: Self-pay | Admitting: Internal Medicine

## 2020-08-07 NOTE — Telephone Encounter (Signed)
Patient informed and verbalized understanding.  Given number to call and schedule.

## 2020-08-07 NOTE — Telephone Encounter (Signed)
This is last fill of seroquel referred [facility] to [facility] in [place] does she have appt? Mental health meds must come from psych

## 2020-08-09 ENCOUNTER — Other Ambulatory Visit: Payer: Self-pay | Admitting: Internal Medicine

## 2020-08-09 DIAGNOSIS — J309 Allergic rhinitis, unspecified: Secondary | ICD-10-CM

## 2020-08-09 DIAGNOSIS — F3176 Bipolar disorder, in full remission, most recent episode depressed: Secondary | ICD-10-CM

## 2020-08-09 DIAGNOSIS — F419 Anxiety disorder, unspecified: Secondary | ICD-10-CM

## 2020-08-12 DIAGNOSIS — R4189 Other symptoms and signs involving cognitive functions and awareness: Secondary | ICD-10-CM | POA: Diagnosis not present

## 2020-08-12 DIAGNOSIS — Z79899 Other long term (current) drug therapy: Secondary | ICD-10-CM | POA: Diagnosis not present

## 2020-08-12 DIAGNOSIS — R2689 Other abnormalities of gait and mobility: Secondary | ICD-10-CM | POA: Diagnosis not present

## 2020-08-14 ENCOUNTER — Other Ambulatory Visit: Payer: Self-pay | Admitting: Student

## 2020-08-14 ENCOUNTER — Other Ambulatory Visit (HOSPITAL_COMMUNITY): Payer: Self-pay | Admitting: Student

## 2020-08-14 DIAGNOSIS — R4189 Other symptoms and signs involving cognitive functions and awareness: Secondary | ICD-10-CM

## 2020-08-28 ENCOUNTER — Ambulatory Visit (HOSPITAL_COMMUNITY): Payer: Medicare PPO

## 2020-09-01 ENCOUNTER — Other Ambulatory Visit: Payer: Self-pay | Admitting: Internal Medicine

## 2020-09-01 DIAGNOSIS — R059 Cough, unspecified: Secondary | ICD-10-CM

## 2020-09-02 IMAGING — CT CT SHOULDER*L* W/O CM
2 series · 10 of 14 positions shown, 12 images · non-contrast
Comparison: X-ray 01/10/2019

CLINICAL DATA: Left shoulder pain following left shoulder
arthroplasty in Wednesday July, 2018.

EXAM:
CT OF THE UPPER LEFT EXTREMITY WITHOUT CONTRAST
TECHNIQUE: Multidetector CT imaging of the upper left extremity was performed
according to the standard protocol.

[Series 2: shoulder (person_name) 2.00 ax · axial · 0.41mm/px · z∈[-837,-741]mm · 5 of 72 slices shown, 7 images]
[im 12/72  soft-tissue]
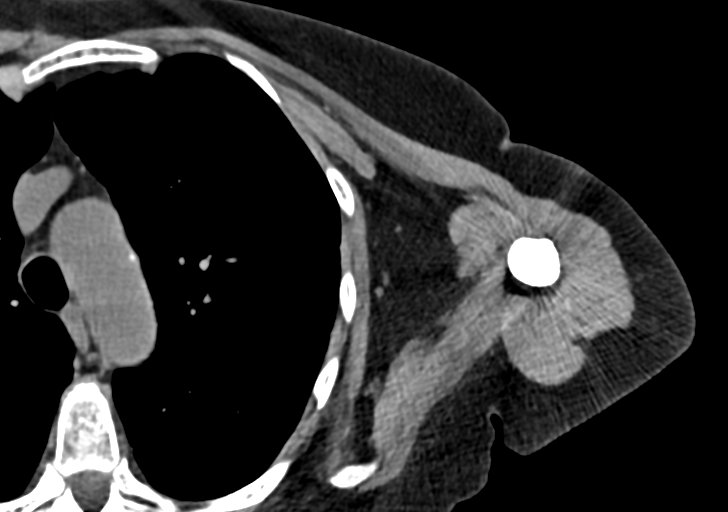
[im 12/72  bone]
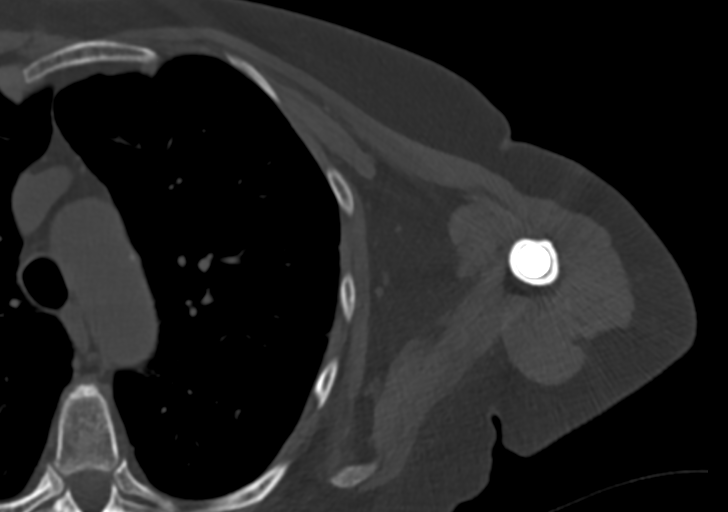
[im 24/72  bone]
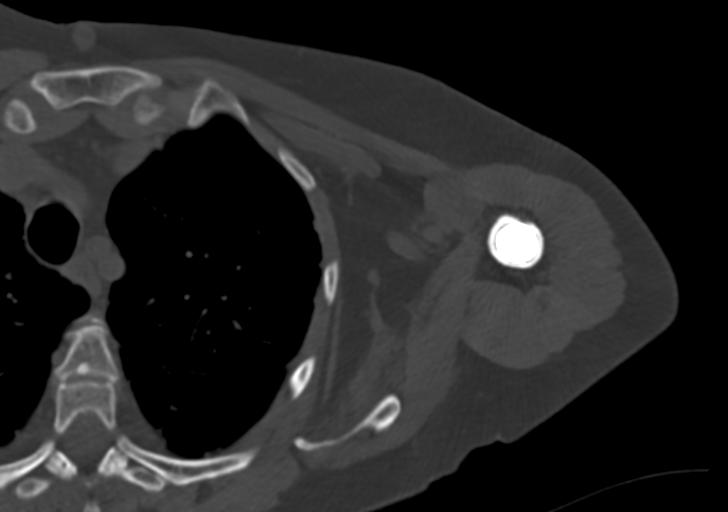
[im 36/72  bone]
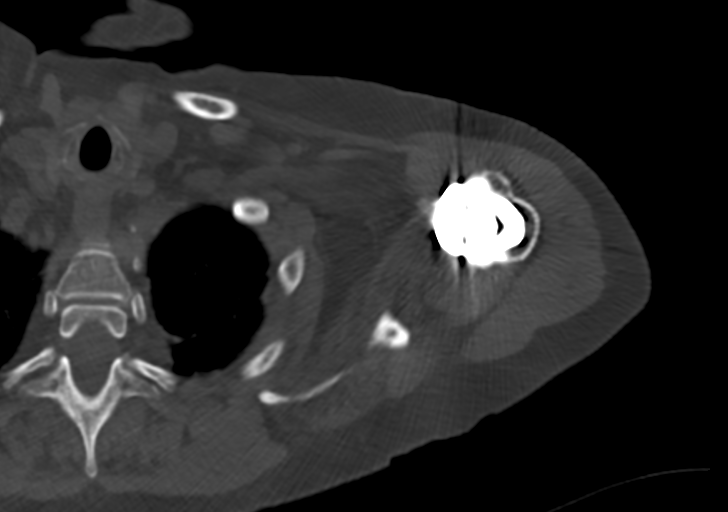
[im 48/72  bone]
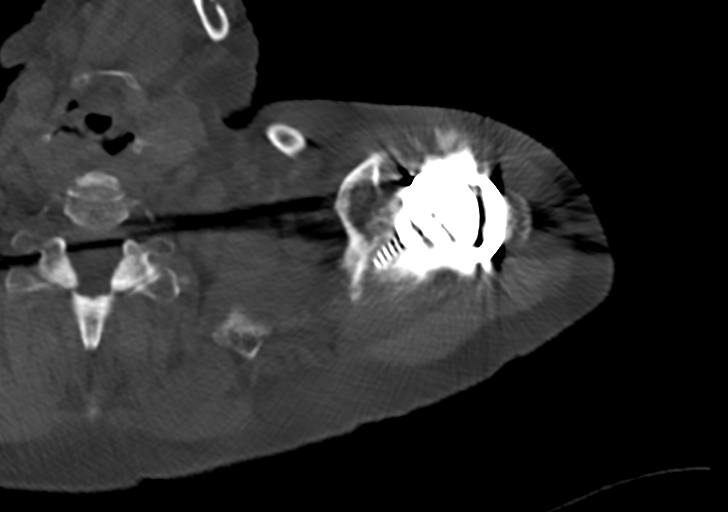
[im 60/72  soft-tissue]
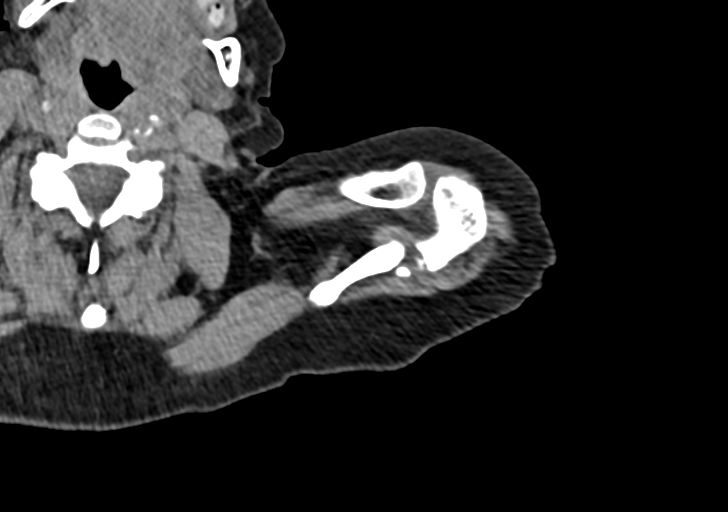
[im 60/72  bone]
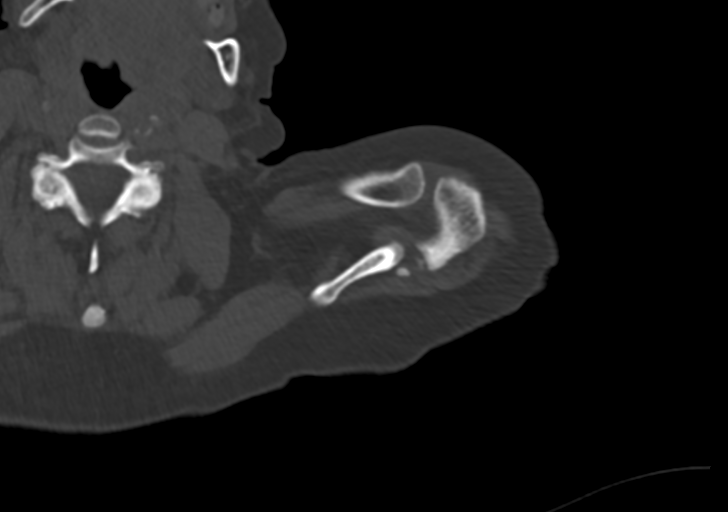

[Series 4: shoulder 2.00 ax · axial · 0.41mm/px · z∈[-837,-741]mm · 5 of 72 slices shown]
[im 12/72  bone]
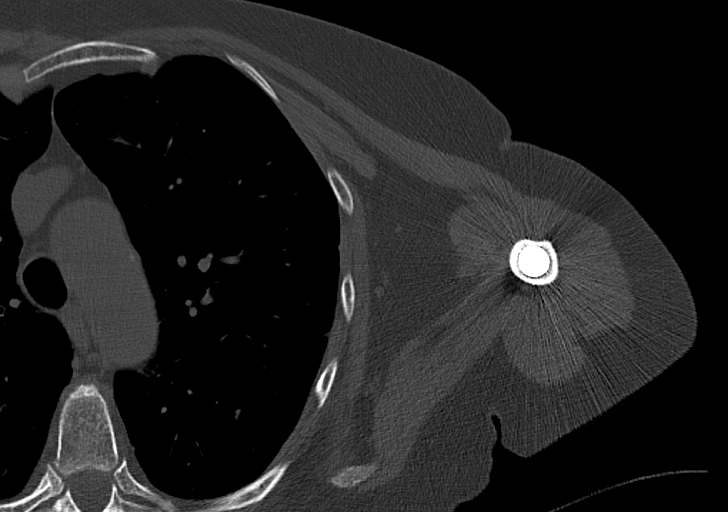
[im 24/72  bone]
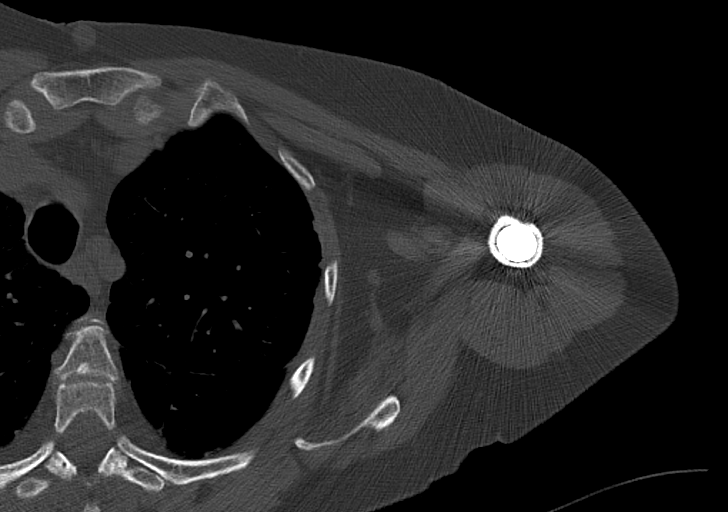
[im 36/72  bone]
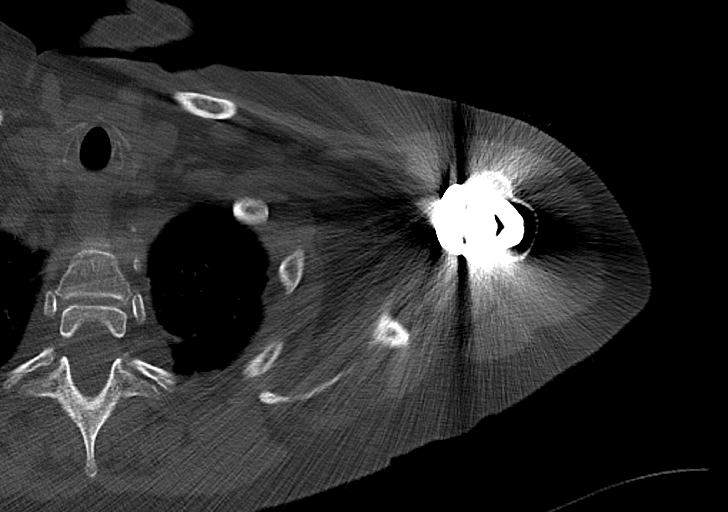
[im 48/72  bone]
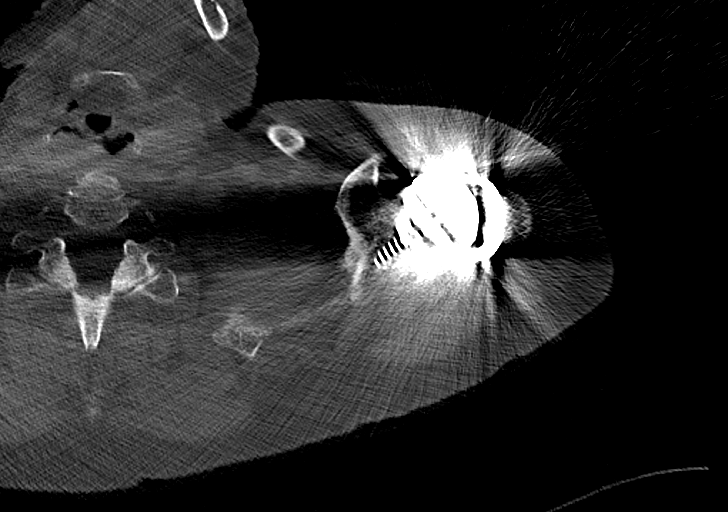
[im 60/72  bone]
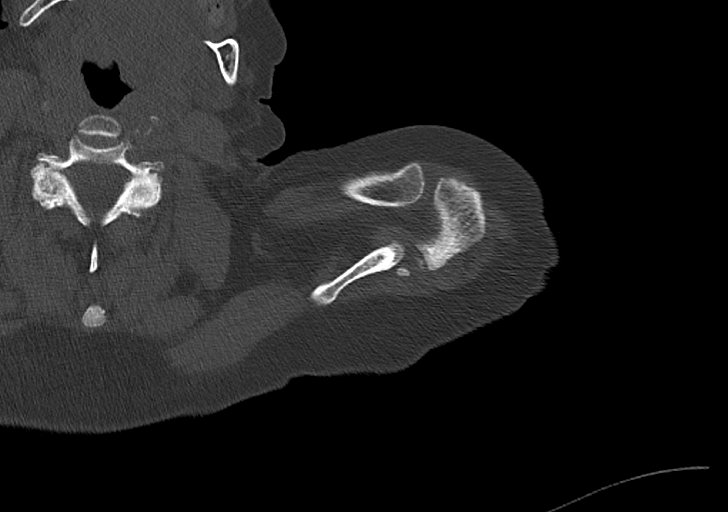

[10 of 14 positions shown; findings below may reference images not displayed]

FINDINGS: Bones/Joint/Cartilage

Status post reverse left shoulder arthroplasty. Arthroplasty
components appear in their expected alignment without evidence of
periprosthetic lucency or fracture. No appreciable periprosthetic
fluid collection. Fracture through the midportion of the acromion
with 5 mm of diastasis at the fracture site and approximately 7 mm
of posterior displacement (series 4, image 8; series 8, image 50).
Fracture margins are corticated. There are multiple small osseous
fragments at the fracture site with prominent soft tissue thickening
suggesting pseudoarthrosis. AC joint intact and appears
unremarkable. Degenerative changes within the visualized
cervicothoracic spine.

Ligaments

Suboptimally assessed by CT.

Muscles and Tendons

No appreciable musculotendinous abnormality.

Soft tissues

No soft tissue fluid collection or hematoma. There is a round
cutaneous/subcutaneous soft tissue density lesion at the midline of
the chest wall just above the level of the manubrium measuring 13 mm
in diameter (series 2, image 47), possibly sebaceous or epidermoid
cyst. Aortic atherosclerosis.
IMPRESSION: 1. Left acromial stress fracture with 5 mm of diastasis at the
fracture site and approximately 7 mm of posterior displacement.
Fracture margins are corticated. There are multiple small osseous
fragments at the fracture site with prominent soft tissue thickening
suggesting pseudoarthrosis.
2. Status post reverse left shoulder arthroplasty without evidence
of periprosthetic lucency or periprosthetic fracture.
3. There is a round cutaneous/subcutaneous soft tissue density
lesion at the midline of the chest wall just above the level of the
manubrium measuring 13 mm in diameter, possibly sebaceous or
epidermoid cyst. Correlate with physical exam.
4. Aortic atherosclerosis. ([id number]).

## 2020-09-03 ENCOUNTER — Encounter: Payer: Self-pay | Admitting: Internal Medicine

## 2020-09-03 ENCOUNTER — Ambulatory Visit (INDEPENDENT_AMBULATORY_CARE_PROVIDER_SITE_OTHER): Payer: Medicare PPO | Admitting: Internal Medicine

## 2020-09-03 VITALS — BP 126/70 | HR 69 | Ht 65.5 in | Wt 182.2 lb

## 2020-09-03 DIAGNOSIS — R059 Cough, unspecified: Secondary | ICD-10-CM

## 2020-09-03 DIAGNOSIS — J4541 Moderate persistent asthma with (acute) exacerbation: Secondary | ICD-10-CM | POA: Diagnosis not present

## 2020-09-03 DIAGNOSIS — J8489 Other specified interstitial pulmonary diseases: Secondary | ICD-10-CM

## 2020-09-03 DIAGNOSIS — Z20822 Contact with and (suspected) exposure to covid-19: Secondary | ICD-10-CM | POA: Diagnosis not present

## 2020-09-03 DIAGNOSIS — F3176 Bipolar disorder, in full remission, most recent episode depressed: Secondary | ICD-10-CM

## 2020-09-03 DIAGNOSIS — I10 Essential (primary) hypertension: Secondary | ICD-10-CM

## 2020-09-03 DIAGNOSIS — J449 Chronic obstructive pulmonary disease, unspecified: Secondary | ICD-10-CM

## 2020-09-03 DIAGNOSIS — F419 Anxiety disorder, unspecified: Secondary | ICD-10-CM | POA: Diagnosis not present

## 2020-09-03 MED ORDER — AMLODIPINE BESYLATE 2.5 MG PO TABS: 2.5000 mg | ORAL_TABLET | Freq: Every day | ORAL | 3 refills | Status: DC

## 2020-09-03 MED ORDER — GABAPENTIN 300 MG PO CAPS: 600.0000 mg | ORAL_CAPSULE | Freq: Two times a day (BID) | ORAL | 3 refills | Status: DC

## 2020-09-03 MED ORDER — ALBUTEROL SULFATE HFA 108 (90 BASE) MCG/ACT IN AERS: 2.0000 | INHALATION_SPRAY | Freq: Four times a day (QID) | RESPIRATORY_TRACT | 11 refills | Status: DC | PRN

## 2020-09-03 MED ORDER — BENZONATATE 200 MG PO CAPS: 200.0000 mg | ORAL_CAPSULE | Freq: Three times a day (TID) | ORAL | 0 refills | Status: DC | PRN

## 2020-09-03 MED ORDER — AMOXICILLIN-POT CLAVULANATE 875-125 MG PO TABS: 1.0000 | ORAL_TABLET | Freq: Two times a day (BID) | ORAL | 0 refills | Status: DC

## 2020-09-03 MED ORDER — LAMOTRIGINE 100 MG PO TABS: ORAL_TABLET | ORAL | 1 refills | Status: DC

## 2020-09-03 NOTE — Patient Instructions (Addendum)
Call [facility]  Referral psychiatry  [phone]  [address] [place]  [facility] counseling and psychiatry [place] [address] [phone]   [facility] [place] [address] [place] [address] [phone]  [facility]  [address]  Claudina Lick [place]  [phone]

## 2020-09-03 NOTE — Progress Notes (Signed)
Telephone Note  I connected with Kerri Carter  on 09/03/20 at 12:00PM EDT by a telephone and verified that I am speaking with the correct person using two identifiers.  Location patient: home, [place] Location provider:work or home office Persons participating in the virtual visit: patient, provider  I discussed the limitations of evaluation and management by telemedicine and the availability of in person appointments. The patient expressed understanding and agreed to proceed.   HPI:  Acute telemedicine visit for : Covid exposure Grandson + Saturday prior to day and negative home test yesterday c/o clear to yellow phelgm no fever used neb yesterday 3x h/o asthma/copd, NSIR related to RA takes Humira Qosaturday Humira yesterday cough worse wants refill tessalone perles and albuterol inhaler on prednisone 5 mg qd  She may have had covid 1-2 years ago and husband died from covid 70   -COVID-19 vaccine status: none  ROS: See pertinent positives and negatives per HPI.  Past Medical History:  Diagnosis Date   Anxiety    Asthma    Bipolar disorder ([place])    C. difficile diarrhea    from Abx 2010 hospitalized    Cataract    s/p b/l repair    Chicken pox    CKD (chronic kidney disease)    CKD (chronic kidney disease), stage III (HCC)    a. s/p R nephrectomy./ aneurysm   Conversion disorder    COPD (chronic obstructive pulmonary disease) (HCC)    Depression    Essential hypertension    GERD (gastroesophageal reflux disease)    Hyperlipidemia    ILD (interstitial lung disease) (HCC)    mild; 2/2 RA diagnosis   Inflammatory arthritis    a. hands/carpal tunnel.  b. Low titer rheumatoid factor. c. Negative anti-CCP antibodies. d. Plaquenil.   Nocturnal hypoxemia    Non-Obstructive CAD    a. 07/2009 Cath ([facility]): nonobs dzs;  b. 03/2011 Cath [facility] [place]): nonobs dzs.   Osteoarthritis    a. Knees.   PAD (peripheral artery disease) (HCC)    PUD (peptic ulcer disease)    S/P right hip fracture     11/01/16 s/p repair   Shoulder pain    Sleep apnea    no cpap / minimal   Spinal stenosis at L4-L5 level    severe with L4/L5 anterolisthesis grade 1 anterolisthesis    Toxic maculopathy    Valvular heart disease    a. 07/2015 Echo: EF 55-60%, Mild AI, AS, MR, and TR.    Past Surgical History:  Procedure Laterality Date   APPENDECTOMY     BACK SURGERY     lumbar fusion   BUNIONECTOMY Right    CATARACT EXTRACTION, BILATERAL     CESAREAN SECTION     x1   CHOLECYSTECTOMY N/A 05/11/2016   Procedure: LAPAROSCOPIC CHOLECYSTECTOMY;  Surgeon: Florene Glen, MD;  Location: [facility] ORS;  Service: General;  Laterality: N/A;   COLONOSCOPY WITH PROPOFOL N/A 04/02/2016   Procedure: COLONOSCOPY WITH PROPOFOL;  Surgeon: Jonathon Bellows, MD;  Location: [facility] END[facility]COPY;  Service: Endoscopy;  Laterality: N/A;   ENDOSCOPIC RETROGRADE CHOLANGIOPANCREATOGRAPHY (ERCP) WITH PROPOFOL N/A 05/08/2016   Procedure: ENDOSCOPIC RETROGRADE CHOLANGIOPANCREATOGRAPHY (ERCP) WITH PROPOFOL;  Surgeon: Lucilla Lame, MD;  Location: [facility] END[facility];  Service: Endoscopy;  Laterality: N/A;   ERCP     with biliary spincterotomy 05/08/16 Dr. Allen Norris for choledocholithiasis    ESOPHAGEAL DILATION  04/02/2016   Procedure: ESOPHAGEAL DILATION;  Surgeon: Jonathon Bellows, MD;  Location: [facility] END[facility];  Service: Endoscopy;;   ESOPHAGOGASTRODUODENOSCOPY (  EGD) WITH PROPOFOL N/A 04/02/2016   Procedure: ESOPHAGOGASTRODUODENOSCOPY (EGD) WITH PROPOFOL;  Surgeon: Jonathon Bellows, MD;  Location: [facility] END[facility];  Service: Endoscopy;  Laterality: N/A;   HIP ARTHROPLASTY Right 11/01/2016   Procedure: ARTHROPLASTY BIPOLAR HIP (HEMIARTHROPLASTY);  Surgeon: Corky Mull, MD;  Location: [facility] OR[facility];  Service: Orthopedics;  Laterality: Right;   NEPHRECTOMY  1988   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   ORIF SCAPHOID FRACTURE Left 12/21/2019   Procedure: OPEN REDUCTION INTERNAL FIXATION (ORIF) OF LEFT SCAPULAR NONUNION WITH BONE GRAFT;  Surgeon: Corky Mull, MD;  Location: [facility];  Service: Orthopedics;  Laterality: Left;   osteoporosis     noted DEXA 08/19/16    REPLACEMENT TOTAL KNEE Right    REVERSE SHOULDER ARTHROPLASTY Right 11/04/2017   Procedure: REVERSE SHOULDER ARTHROPLASTY;  Surgeon: Corky Mull, MD;  Location: [facility];  Service: Orthopedics;  Laterality: Right;   REVERSE SHOULDER ARTHROPLASTY Left 07/26/2018   Procedure: REVERSE SHOULDER ARTHROPLASTY;  Surgeon: Corky Mull, MD;  Location: [facility];  Service: Orthopedics;  Laterality: Left;   TONSILLECTOMY     TOTAL HIP ARTHROPLASTY  12/10/11   [facility] left hip   TOTAL HIP ARTHROPLASTY Bilateral    TUBAL LIGATION       Current Outpatient Medications:    Adalimumab (HUMIRA [place]), Inject into the skin., Disp: , Rfl:    Adalimumab 40 MG/0.4ML PNKT, Inject 40 mg into the skin every 14 (fourteen) days. , Disp: , Rfl:    ALPRAZolam (XANAX) 0.25 MG tablet, TAKE 1 TABLET BY MOUTH AT BEDTIME AS NEEDED FOR ANXIETY., Disp: 90 tablet, Rfl: 1   amoxicillin-clavulanate (AUGMENTIN) 875-125 MG tablet, Take 1 tablet by mouth 2 (two) times daily. With food, Disp: 14 tablet, Rfl: 0   budesonide (PULMICORT) 0.25 MG/2ML nebulizer solution, USE 1 VIAL  IN  NEBULIZER TWICE  DAILY - rinse mouth after treatment, Disp: 60 mL, Rfl: 11   dicyclomine (BENTYL) 10 MG capsule, TAKE 1 CAPSULE (10 MG TOTAL) BY MOUTH 4 (FOUR) TIMES DAILY - BEFORE MEALS AND AT BEDTIME. (Patient taking differently: Take 20 mg by mouth in the morning and at bedtime.), Disp: 360 capsule, Rfl: 3   escitalopram (LEXAPRO) 10 MG tablet, TAKE 1 TABLET BY MOUTH EVERY DAY, Disp: 90 tablet, Rfl: 0   etodolac (LODINE) 500 MG tablet, Take 500 mg by mouth at bedtime., Disp: , Rfl:    famotidine (PEPCID) 20 MG tablet, TAKE 1 TABLET (20 MG TOTAL) BY MOUTH DAILY. BEFORE BREAKFAST OR DINNER, Disp: 90 tablet, Rfl: 3   fluticasone (FLONASE) 50 MCG/ACT nasal spray, Place 2 sprays into both nostrils daily. In am, Disp: , Rfl:    ipratropium-albuterol  (DUONEB) 0.5-2.5 (3) MG/3ML SOLN, TAKE 3 MLS BY NEBULIZATION 3 (THREE) TIMES DAILY AS NEEDED., Disp: 360 mL, Rfl: 3   leflunomide (ARAVA) 20 MG tablet, Take 1 tablet (20 mg total) by mouth daily., Disp: , Rfl:    lovastatin (MEVACOR) 20 MG tablet, TAKE 1 TABLET BY MOUTH EVERYDAY AT BEDTIME *STOP TALKING '40MG'$ *, Disp: 90 tablet, Rfl: 3   montelukast (SINGULAIR) 10 MG tablet, TAKE 1 TABLET BY MOUTH EVERY DAY, Disp: 90 tablet, Rfl: 2   MYRBETRIQ 50 MG TB24 tablet, TAKE 1 TABLET BY MOUTH EVERYDAY AT BEDTIME, Disp: 30 tablet, Rfl: 11   nebivolol (BYSTOLIC) 10 MG tablet, Take 0.5 tablets (5 mg total) by mouth in the morning and at bedtime., Disp: , Rfl:    pantoprazole (PROTONIX) 40 MG tablet,  TAKE 1 TABLET BY MOUTH 2 TIMES DAILY 30 MIN BEFORE FOOD (NOTE REDUCTION IN FREQUENCY), Disp: 180 tablet, Rfl: 3   predniSONE (DELTASONE) 5 MG tablet, TAKE 1.5 TABLETS (7.5 MG TOTAL) BY MOUTH DAILY WITH BREAKFAST., Disp: 45 tablet, Rfl: 2   QUEtiapine (SEROQUEL) 25 MG tablet, Take 1 tablet (25 mg total) by mouth at bedtime. Again last fill further refills from psychiatry no exceptions, Disp: 90 tablet, Rfl: 0   sucralfate (CARAFATE) 1 g tablet, Take 1 tablet (1 g total) by mouth 2 (two) times daily., Disp: 180 tablet, Rfl: 3   albuterol (VENTOLIN HFA) 108 (90 Base) MCG/ACT inhaler, Inhale 2 puffs into the lungs every 6 (six) hours as needed for wheezing or shortness of breath., Disp: 18 g, Rfl: 11   amLODipine (NORVASC) 2.5 MG tablet, Take 1 tablet (2.5 mg total) by mouth daily., Disp: 90 tablet, Rfl: 3   benzonatate (TESSALON) 200 MG capsule, Take 1 capsule (200 mg total) by mouth 3 (three) times daily as needed for cough., Disp: 40 capsule, Rfl: 0   gabapentin (NEURONTIN) 300 MG capsule, Take 2 capsules (600 mg total) by mouth in the morning and at bedtime., Disp: 360 capsule, Rfl: 3   lamoTRIgine (LAMICTAL) 100 MG tablet, TAKE 1 TAB 2 TIMES DAILY. FURTHER REFILLS NEW PSYCH FOR ALL PSYCH MEDS ONLY TEMP SUPPLY FROM PCP,  Disp: 180 tablet, Rfl: 1  EXAM:  VITALS per patient if applicable:  GENERAL: alert, oriented, appears well and in no acute distress  HEENT: atraumatic, conjunttiva clear, no obvious abnormalities on inspection of external nose and ears  NECK: normal movements of the head and neck  LUNGS: on inspection no signs of respiratory distress, breathing rate appears normal, no obvious gross SOB, gasping or wheezing  CV: no obvious cyanosis  MS: moves all visible extremities without noticeable abnormality  PSYCH/NEURO: pleasant and cooperative, no obvious depression or anxiety, speech and thought processing grossly intact  ASSESSMENT AND PLAN:  Discussed the following assessment and plan: sthma-COPD overlap syndrome (HCC) - Plan: albuterol (VENTOLIN HFA) 108 (90 Base) MCG/ACT inhaler, amoxicillin-clavulanate (AUGMENTIN) 875-125 MG tablet NSIP (nonspecific interstitial pneumonitis) (HCC) - Plan: albuterol (VENTOLIN HFA) 108 (90 Base) MCG/ACT inhaler Cough - Plan: benzonatate (TESSALON) 200 MG capsule, gabapentin (NEURONTIN) 300 MG capsule, albuterol (VENTOLIN HFA) 108 (90 Base) MCG/ACT inhaler Moderate persistent asthma with exacerbation - Plan: amoxicillin-clavulanate (AUGMENTIN) 875-125 MG tablet Close exposure to COVID-19 virus - Plan: Novel Coronavirus, NAA ([facility]) Hold molnupiravir ddi with humira if tests + covid will Rx for now see below Rx augmentin  F/u with pulm   Hypertension, unspecified type - Plan: amLODipine (NORVASC) 2.5 MG tablet refilled today with bystolic 10 mg bid   Bipolar disorder, in full remission, most recent episode depressed ([place]) - Plan: lamoTRIgine (LAMICTAL) 100 MG tablet needs to f/u with psych referred 07/2020  Anxiety - Plan: lamoTRIgine (LAMICTAL) 100 MG tablet, seroquel 25 mg qd   A-we discussed possible serious and likely etiologies, options for evaluation and workup, limitations of telemedicine visit vs in person visit, treatment, treatment risks and  precautions. Pt prefers to treat via telemedicine empirically rather than in person at this moment.   I discussed the assessment and treatment plan with the patient. The patient was provided an opportunity to ask questions and all were answered. The patient agreed with the plan and demonstrated an understanding of the instructions.    Time spent 20 minutes  Delorise Jackson, MD

## 2020-09-04 ENCOUNTER — Other Ambulatory Visit (HOSPITAL_COMMUNITY): Payer: Medicare PPO

## 2020-09-04 ENCOUNTER — Ambulatory Visit: Admission: RE | Admit: 2020-09-04 | Payer: Medicare PPO | Source: Ambulatory Visit

## 2020-09-04 LAB — SARS-COV-2, NAA 2 DAY TAT

## 2020-09-04 LAB — NOVEL CORONAVIRUS, NAA: SARS-CoV-2, NAA: NOT DETECTED

## 2020-09-06 ENCOUNTER — Encounter: Payer: Self-pay | Admitting: Pulmonary Disease

## 2020-09-06 ENCOUNTER — Ambulatory Visit: Payer: Medicare PPO | Admitting: Pulmonary Disease

## 2020-09-06 ENCOUNTER — Other Ambulatory Visit: Payer: Self-pay

## 2020-09-06 VITALS — BP 112/80 | HR 74 | Temp 98.0°F | Ht 65.5 in | Wt 187.4 lb

## 2020-09-06 DIAGNOSIS — R059 Cough, unspecified: Secondary | ICD-10-CM | POA: Diagnosis not present

## 2020-09-06 DIAGNOSIS — J8489 Other specified interstitial pulmonary diseases: Secondary | ICD-10-CM

## 2020-09-06 DIAGNOSIS — G4736 Sleep related hypoventilation in conditions classified elsewhere: Secondary | ICD-10-CM

## 2020-09-06 DIAGNOSIS — M0579 Rheumatoid arthritis with rheumatoid factor of multiple sites without organ or systems involvement: Secondary | ICD-10-CM

## 2020-09-06 DIAGNOSIS — J449 Chronic obstructive pulmonary disease, unspecified: Secondary | ICD-10-CM | POA: Diagnosis not present

## 2020-09-06 NOTE — Progress Notes (Deleted)
   Subjective:    Patient ID: Kerri Carter, female    DOB: 1940/07/24, 80 y.o.   MRN: [id number]  HPI    Review of Systems     Objective:   Physical Exam        Assessment & Plan:

## 2020-09-06 NOTE — Progress Notes (Signed)
Subjective:    Patient ID: Kerri Carter, female    DOB: Sep 06, 1940, 80 y.o.   MRN: [id number] Chief Complaint  Patient presents with   Follow-up    Pt c/o increased cough for the past 4 days- seen by PCP 09/03/20 and augmentin. She is coughing up clear to yellow sputum. She feels that her breathing has been worse over the past 2 months. She is using her albuterol inhaler 5 x per wk on average.     HPI Kerri Carter is a 80 year old former smoker (quit 1976) who presents for follow-up for various issues to include asthma/COPD overlap syndrome, NSIP related to rheumatoid arthritis (rheumatoid lung) and cough.  This is a scheduled appointment.  She was last seen here on 30 April 2020.  She was doing well at that time.  Patient states that since her last visit she has had increased cough and shortness of breath.  She had a change in sputum production and cough approximately 4 days ago and has been started on Augmentin by her primary care physician.  She has chronic issues with cough that occur mostly in the morning and postprandially.  She also notes that when she puts her head on the pillow at nighttime her cough gets markedly worse.  She tells me she is using feather pillows.  For the most part the cough does not bother her but over the last 4 days this has been worse. She has not had any fevers, chills or sweats.  No orthopnea or paroxysmal nocturnal dyspnea.  No hemoptysis, sputum has been yellow.  She is compliant with nocturnal oxygen and notes that she sleeps better and is less symptomatic with dyspnea and cough during the day with this treatment.  She has not been using her nebulizers consistently.  She should be on DuoNeb and Pulmicort twice a day but has not been doing these.  She cannot explain why.  She does not endorse any other complaint today.    Review of Systems A 10 point review of systems was performed and it is as noted above otherwise negative.  Patient Active Problem List   Diagnosis  Date Noted   Acromial process of scapula fracture 12/21/2019   NSIP (nonspecific interstitial pneumonitis) ([place]) 12/08/2019   Iliopsoas bursitis of right hip 08/29/2019   Chronic left shoulder pain 08/29/2019   Overweight (BMI 25.0-29.9) 08/29/2019   Hypertriglyceridemia 07/24/2019   Bipolar disorder, in full remission, most recent episode depressed ([place]) 07/13/2019   Essential hypertension 06/16/2019   Chronic pain 06/16/2019   Lymphedema 06/05/2019   Chronic venous insufficiency 05/25/2019   PAD (peripheral artery disease) ([place]) 05/25/2019   Leg edema 05/02/2019   Episodic mood disorder ([place]) [id number]   Complicated grief [id number]   At high risk for falls 03/16/2019   Pelvic mass in female 03/16/2019   Compression fracture of L4 vertebra ([place]) 02/21/2019   Collapsed vertebra, not elsewhere classified, lumbar region, initial encounter for fracture ([place]) 02/21/2019   Anxiety 01/20/2019   Pain due to onychomycosis of toenails of both feet 12/05/2018   Hav (hallux abducto valgus), unspecified laterality 12/05/2018   Closed fracture of acromial process of scapula 10/28/2018   Status post reverse arthroplasty of shoulder, left 07/26/2018   Interstitial pulmonary disease ([place]) 07/07/2018   Fatigue 07/07/2018   Avascular necrosis of left shoulder due to adverse effect of steroid therapy ([place]) 01/20/2018   Other shoulder lesions, left shoulder 12/20/2017   Status post reverse total shoulder replacement, right 11/04/2017  Leukocytosis 10/19/2017   Allergic rhinitis 09/28/2017   Rotator cuff tendinitis, right 07/26/2017   Strain of right hip 02/26/2017   History of fracture of left hip 01/15/2017   Status post lumbar spinal fusion 01/15/2017   Dislocation of hip joint prosthesis ([place]) 01/08/2017   Status post hip hemiarthroplasty 01/08/2017   Leg swelling 12/15/2016   Age-related osteoporosis without current pathological fracture 11/05/2016   Hip fracture ([place]) 10/31/2016    Polyneuropathy 10/28/2016   Left foot pain 10/19/2016   Spinal stenosis of lumbar region 09/15/2016   Osteoporosis 08/27/2016   Drug-induced osteoporosis 08/27/2016   Adnexal mass 08/25/2016   Prediabetes 08/25/2016   Coronary atherosclerosis 08/25/2016   History of syncope 08/25/2016   Hypokalemia 08/25/2016   Mixed bipolar I disorder ([place]) 10/09/2015   Stage 3b chronic kidney disease ([place])    Conversion disorder 08/04/2015   Hyperlipidemia 07/17/2015   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   Macular degeneration 07/17/2015   Insomnia 07/17/2015   Rheumatoid arthritis ([place]) 12/05/2014   Other specified postprocedural states 07/20/2013   Anxiety and depression 06/13/2013   Osteoarthritis of knee 06/07/2013   Cough 05/02/2012   Valvular heart disease 10/13/2011   Asthma-COPD overlap syndrome ([place]) 09/09/2011   OSA (obstructive sleep apnea) 09/09/2011   Nocturnal hypoxemia 09/09/2011   Gastroesophageal reflux disease 02/24/2011   Single kidney 02/24/2011   H/O cardiac catheterization 02/24/2011   Renal artery stenosis ([place]) 02/24/2011   Social History   Tobacco Use   Smoking status: Former    Packs/day: 0.50    Years: 20.00    Pack years: 10.00    Types: Cigarettes    Quit date: 02/02/1974    Years since quitting: 46.6   Smokeless tobacco: Never  Substance Use Topics   Alcohol use: No   Allergies  Allergen Reactions   Ceftin [Cefuroxime Axetil] Anaphylaxis   Lisinopril Anaphylaxis   Sulfa Antibiotics Other (See Comments)    Face swelling   Sulfasalazine Anaphylaxis   Morphine Other (See Comments)    Per patient, low blood pressure issues that requires action to raise it back up. Can take small infrequent doses   Xarelto [Rivaroxaban] Other (See Comments)    Stomach burning, bleeding, and tar in stool   Adhesive [Tape] Rash    Paper tape and tega derm OK   Antihistamines, Chlorpheniramine-Type Other (See Comments)    Makes pt hyper    Antivert [Meclizine Hcl] Other (See Comments)    Bladder will not empty   Aspirin Other (See Comments)    Sulfasalazine allergy cross reacts   Contrast Media [Iodinated Diagnostic Agents] Rash    she is able to use betadine scrubs.   Decongestant [Pseudoephedrine Hcl] Other (See Comments)    Makes pt hyper   Doxycycline Other (See Comments)    GI upset   Levaquin [Levofloxacin In D5w] Rash   Polymyxin B Other (See Comments)    Facial rash   Tetanus Toxoids Rash and Other (See Comments)    Fever and hot to touch at injection site   Current Meds  Medication Sig   Adalimumab 40 MG/0.4ML PNKT Inject 40 mg into the skin every 14 (fourteen) days.    albuterol (VENTOLIN HFA) 108 (90 Base) MCG/ACT inhaler Inhale 2 puffs into the lungs every 6 (six) hours as needed for wheezing or shortness of breath.   ALPRAZolam (XANAX) 0.25 MG tablet TAKE 1 TABLET BY MOUTH AT BEDTIME AS NEEDED FOR ANXIETY.   amLODipine (NORVASC) 2.5  MG tablet Take 1 tablet (2.5 mg total) by mouth daily.   amoxicillin-clavulanate (AUGMENTIN) 875-125 MG tablet Take 1 tablet by mouth 2 (two) times daily. With food   benzonatate (TESSALON) 200 MG capsule Take 1 capsule (200 mg total) by mouth 3 (three) times daily as needed for cough.   budesonide (PULMICORT) 0.25 MG/2ML nebulizer solution USE 1 VIAL  IN  NEBULIZER TWICE  DAILY - rinse mouth after treatment   dicyclomine (BENTYL) 10 MG capsule TAKE 1 CAPSULE (10 MG TOTAL) BY MOUTH 4 (FOUR) TIMES DAILY - BEFORE MEALS AND AT BEDTIME. (Patient taking differently: Take 20 mg by mouth in the morning and at bedtime.)   escitalopram (LEXAPRO) 10 MG tablet TAKE 1 TABLET BY MOUTH EVERY DAY   etodolac (LODINE) 500 MG tablet Take 500 mg by mouth at bedtime.   famotidine (PEPCID) 20 MG tablet TAKE 1 TABLET (20 MG TOTAL) BY MOUTH DAILY. BEFORE BREAKFAST OR DINNER   fluticasone (FLONASE) 50 MCG/ACT nasal spray Place 2 sprays into both nostrils daily. In am   gabapentin (NEURONTIN) 300 MG  capsule Take 2 capsules (600 mg total) by mouth in the morning and at bedtime.   ipratropium-albuterol (DUONEB) 0.5-2.5 (3) MG/3ML SOLN TAKE 3 MLS BY NEBULIZATION 3 (THREE) TIMES DAILY AS NEEDED.   lamoTRIgine (LAMICTAL) 100 MG tablet TAKE 1 TAB 2 TIMES DAILY. FURTHER REFILLS NEW PSYCH FOR ALL PSYCH MEDS ONLY TEMP SUPPLY FROM PCP   leflunomide (ARAVA) 20 MG tablet Take 1 tablet (20 mg total) by mouth daily.   lovastatin (MEVACOR) 20 MG tablet TAKE 1 TABLET BY MOUTH EVERYDAY AT BEDTIME *STOP TALKING '40MG'$ *   montelukast (SINGULAIR) 10 MG tablet TAKE 1 TABLET BY MOUTH EVERY DAY   MYRBETRIQ 50 MG TB24 tablet TAKE 1 TABLET BY MOUTH EVERYDAY AT BEDTIME   nebivolol (BYSTOLIC) 10 MG tablet Take 0.5 tablets (5 mg total) by mouth in the morning and at bedtime.   pantoprazole (PROTONIX) 40 MG tablet TAKE 1 TABLET BY MOUTH 2 TIMES DAILY 30 MIN BEFORE FOOD (NOTE REDUCTION IN FREQUENCY)   predniSONE (DELTASONE) 5 MG tablet TAKE 1.5 TABLETS (7.5 MG TOTAL) BY MOUTH DAILY WITH BREAKFAST. (Patient taking differently: Take 5 mg by mouth daily with breakfast.)   QUEtiapine (SEROQUEL) 25 MG tablet Take 1 tablet (25 mg total) by mouth at bedtime. Again last fill further refills from psychiatry no exceptions   sucralfate (CARAFATE) 1 g tablet Take 1 tablet (1 g total) by mouth 2 (two) times daily.   zoledronic acid (RECLAST) 5 MG/100ML SOLN injection Inject 5 mg into the vein once.   Immunization History  Administered Date(s) Administered   Fluad Quad(high Dose 65+) 10/03/2018, 10/16/2019   Influenza Split 10/04/2012, 10/25/2013, 10/28/2016   Influenza Whole 11/03/2011   Influenza, High Dose Seasonal PF 10/04/2015, 11/03/2016   Influenza,inj,Quad PF,6+ Mos 10/31/2014   Influenza-Unspecified 01/02/2010, 11/24/2011, 10/21/2013, 10/31/2014, 09/28/2017   Pneumococcal Conjugate-13 10/21/2013   Pneumococcal Polysaccharide-23 11/07/2015        Objective:   Physical Exam BP 112/80 (BP Location: Right Arm)   Pulse 74    Temp 98 F (36.7 C) (Temporal)   Ht 5' 5.5" (1.664 m)   Wt 187 lb 6.4 oz (85 kg)   SpO2 95% Comment: on RA  BMI 30.71 kg/m  GENERAL: Well-developed, well-nourished elderly woman, she presents on transport chair today.  HEAD: Normocephalic, atraumatic. EYES: Pupils equal, round, reactive to light.  No scleral icterus. MOUTH: Nose/mouth/throat not examined due to masking requirements for COVID 19.  NECK: Supple. No thyromegaly. Trachea midline. No JVD.  No adenopathy. PULMONARY: Coarse breath sounds throughout, faint dry crackles at the bases more pronounced on the left base, occasional end expiratory wheeze. CARDIOVASCULAR: S1 and S2. Regular rate and rhythm.  Grade 1/6 systolic ejection murmur left sternal border, unchanged. GASTROINTESTINAL: Benign MUSCULOSKELETAL: Mild changes of RA both hands, no clubbing, no edema.  Limited range of motion of joints due to RA. NEUROLOGIC: No overt focal deficits.  Awake, alert, speech is fluent. SKIN: Intact,warm,dry. PSYCH: Mood and behavior appropriate      Assessment & Plan:     ICD-10-CM   1. Asthma-COPD overlap syndrome (HCC)  J44.9    Needs to use her nebulizers CONSISTENTLY DuoNeb 4 times daily with Pulmicort twice daily Completing Augmentin due to recent exacerbation    2. NSIP (nonspecific interstitial pneumonitis) (HCC)  J84.89 Pulmonary Function Test [facility] Only   She is on low-dose prednisone This may be related to rheumatoid lung    3. Rheumatoid arthritis involving multiple sites with positive rheumatoid factor (HCC)  M05.79    This issue adds complexity to her management Continue RA therapy as per rheumatology    4. Cough  R05.9    Increased due to recent exacerbation Continue Augmentin Resume consistent use of bronchodilators as above    5. Nocturnal hypoxemia due to obstructive chronic bronchitis (HCC)  J44.9    G47.36    Continue nocturnal oxygen supplementation     Orders Placed This Encounter  Procedures    Pulmonary Function Test [facility] Only    Standing Status:   Future    Number of Occurrences:   1    Standing Expiration Date:   09/06/2021    Scheduling Instructions:     Wait at least 2 wks    Order Specific Question:   Full PFT: includes the following: basic spirometry, spirometry pre & post bronchodilator, diffusion capacity (DLCO), lung volumes    Answer:   Full PFT    Patient is experiencing increasing issues with shortness of breath and more frequent exacerbations.  Will reassess with PFTs to determine the degree if any, of pulmonary function deterioration.  It is recommended that she use her DuoNeb 4 times a day while she is on antibiotic therapy.  She is currently on Augmentin.  She is also to use Pulmicort twice a day after a dose of her DuoNeb nebulizers.  She has not been doing this consistently.  Patient is also using feather pillows which are highly allergenic I have recommended that she switch to hypoallergenic material pillows.  She notes increasing coughing when she puts her head on the pillow.  I suspect that this is triggering an allergic reaction.  We will see her in follow-up in 4 to 6 weeks time she is to contact us prior to that time should any new problems arise.  Renold Don, MD Advanced Bronchoscopy PCCM [facility]    *This note was dictated using voice recognition software/Dragon.  Despite best efforts to proofread, errors can occur which can change the meaning.  Any change was purely unintentional.

## 2020-09-06 NOTE — Patient Instructions (Signed)
We are going to get a breathing tests to see where your lung function is at.  I recommend you use your ipratropium/albuterol nebulizer 4 times a day while you are taking the antibiotic.  Use the Pulmicort twice a day after one of your ipratropium/albuterol nebulizers.  I also recommend that you change the pillows you are using, you have goose feather pillows which are highly allergenic.  I recommend you get hypoallergenic pillows.  We will see you in follow-up in 4 to 6 weeks time with either me or the nurse practitioner.

## 2020-09-10 ENCOUNTER — Other Ambulatory Visit: Payer: Self-pay | Admitting: Internal Medicine

## 2020-09-10 DIAGNOSIS — M5441 Lumbago with sciatica, right side: Secondary | ICD-10-CM | POA: Diagnosis not present

## 2020-09-10 DIAGNOSIS — F419 Anxiety disorder, unspecified: Secondary | ICD-10-CM

## 2020-09-10 DIAGNOSIS — F3176 Bipolar disorder, in full remission, most recent episode depressed: Secondary | ICD-10-CM

## 2020-09-10 DIAGNOSIS — G8929 Other chronic pain: Secondary | ICD-10-CM | POA: Diagnosis not present

## 2020-09-10 DIAGNOSIS — M5442 Lumbago with sciatica, left side: Secondary | ICD-10-CM | POA: Diagnosis not present

## 2020-09-10 DIAGNOSIS — M48062 Spinal stenosis, lumbar region with neurogenic claudication: Secondary | ICD-10-CM | POA: Diagnosis not present

## 2020-09-11 DIAGNOSIS — N1832 Chronic kidney disease, stage 3b: Secondary | ICD-10-CM | POA: Diagnosis not present

## 2020-09-11 DIAGNOSIS — Z905 Acquired absence of kidney: Secondary | ICD-10-CM | POA: Diagnosis not present

## 2020-09-11 DIAGNOSIS — N2581 Secondary hyperparathyroidism of renal origin: Secondary | ICD-10-CM | POA: Diagnosis not present

## 2020-09-11 DIAGNOSIS — R8290 Unspecified abnormal findings in urine: Secondary | ICD-10-CM | POA: Diagnosis not present

## 2020-09-12 DIAGNOSIS — I110 Hypertensive heart disease with heart failure: Secondary | ICD-10-CM | POA: Diagnosis not present

## 2020-09-12 DIAGNOSIS — G4733 Obstructive sleep apnea (adult) (pediatric): Secondary | ICD-10-CM | POA: Diagnosis not present

## 2020-09-12 DIAGNOSIS — Z9981 Dependence on supplemental oxygen: Secondary | ICD-10-CM | POA: Diagnosis not present

## 2020-09-12 DIAGNOSIS — F419 Anxiety disorder, unspecified: Secondary | ICD-10-CM | POA: Diagnosis not present

## 2020-09-12 DIAGNOSIS — Z882 Allergy status to sulfonamides status: Secondary | ICD-10-CM | POA: Diagnosis not present

## 2020-09-12 DIAGNOSIS — E669 Obesity, unspecified: Secondary | ICD-10-CM | POA: Diagnosis not present

## 2020-09-12 DIAGNOSIS — J449 Chronic obstructive pulmonary disease, unspecified: Secondary | ICD-10-CM | POA: Diagnosis not present

## 2020-09-12 DIAGNOSIS — E785 Hyperlipidemia, unspecified: Secondary | ICD-10-CM | POA: Diagnosis not present

## 2020-09-12 DIAGNOSIS — Z87891 Personal history of nicotine dependence: Secondary | ICD-10-CM | POA: Diagnosis not present

## 2020-09-12 DIAGNOSIS — Z880 Allergy status to penicillin: Secondary | ICD-10-CM | POA: Diagnosis not present

## 2020-09-12 DIAGNOSIS — F319 Bipolar disorder, unspecified: Secondary | ICD-10-CM | POA: Diagnosis not present

## 2020-09-12 DIAGNOSIS — Z886 Allergy status to analgesic agent status: Secondary | ICD-10-CM | POA: Diagnosis not present

## 2020-09-12 DIAGNOSIS — M0550 Rheumatoid polyneuropathy with rheumatoid arthritis of unspecified site: Secondary | ICD-10-CM | POA: Diagnosis not present

## 2020-09-12 DIAGNOSIS — I509 Heart failure, unspecified: Secondary | ICD-10-CM | POA: Diagnosis not present

## 2020-09-17 ENCOUNTER — Other Ambulatory Visit: Payer: Self-pay

## 2020-09-17 DIAGNOSIS — R1031 Right lower quadrant pain: Secondary | ICD-10-CM

## 2020-09-17 MED ORDER — SUCRALFATE 1 G PO TABS: 1.0000 g | ORAL_TABLET | Freq: Two times a day (BID) | ORAL | 3 refills | Status: DC

## 2020-09-18 ENCOUNTER — Ambulatory Visit: Payer: Medicare PPO

## 2020-09-19 ENCOUNTER — Other Ambulatory Visit: Payer: Self-pay

## 2020-09-19 ENCOUNTER — Telehealth: Payer: Self-pay

## 2020-09-19 ENCOUNTER — Ambulatory Visit
Admission: RE | Admit: 2020-09-19 | Discharge: 2020-09-19 | Disposition: A | Discharge status: Home / Self Care | Payer: Medicare PPO | Source: Ambulatory Visit | Attending: Student | Admitting: Student

## 2020-09-19 DIAGNOSIS — I639 Cerebral infarction, unspecified: Secondary | ICD-10-CM | POA: Diagnosis not present

## 2020-09-19 DIAGNOSIS — R4189 Other symptoms and signs involving cognitive functions and awareness: Secondary | ICD-10-CM | POA: Diagnosis not present

## 2020-09-19 DIAGNOSIS — R4781 Slurred speech: Secondary | ICD-10-CM | POA: Diagnosis not present

## 2020-09-19 NOTE — Telephone Encounter (Signed)
Called and spoke to patient and reminded her of her upcoming Covid test on the 23rd, nothing further needed.

## 2020-09-23 ENCOUNTER — Telehealth: Payer: Self-pay | Admitting: Internal Medicine

## 2020-09-23 NOTE — Telephone Encounter (Signed)
PT called to advise that she is having some pain and would like to get advise from [facility] on what to do. Please advise.

## 2020-09-24 ENCOUNTER — Encounter: Payer: Self-pay | Admitting: Emergency Medicine

## 2020-09-24 ENCOUNTER — Ambulatory Visit
Admission: EM | Admit: 2020-09-24 | Discharge: 2020-09-24 | Disposition: A | Discharge status: Home / Self Care | Payer: Medicare PPO | Attending: Emergency Medicine | Admitting: Emergency Medicine

## 2020-09-24 ENCOUNTER — Other Ambulatory Visit: Payer: Medicare PPO

## 2020-09-24 ENCOUNTER — Other Ambulatory Visit: Payer: Self-pay

## 2020-09-24 DIAGNOSIS — M5432 Sciatica, left side: Secondary | ICD-10-CM

## 2020-09-24 DIAGNOSIS — I10 Essential (primary) hypertension: Secondary | ICD-10-CM

## 2020-09-24 MED ORDER — PREDNISONE 10 MG (21) PO TBPK: ORAL_TABLET | Freq: Every day | ORAL | 0 refills | Status: DC

## 2020-09-24 NOTE — ED Provider Notes (Addendum)
Kerri Carter    CSN: [id number] Arrival date & time: 09/24/20  1653      History   Chief Complaint Chief Complaint  Patient presents with   Leg Pain    HPI Kerri Carter is a 80 y.o. female.  Patient presents with lower back pain radiating pain down the back of her left leg x1 week.  No falls or known injury.  She also reports bilateral lower leg numbness when lying down at night.  She also reports right groin pain.  She denies fever, chills, abdominal pain, dysuria, hematuria, rash, lesions, redness, bruising, wounds, or other symptoms.  Her medical history includes hypertension, CKD, single kidney, COPD, rheumatoid arthritis, neuropathy, chronic venous insufficiency, chronic pain.  The history is provided by the patient and medical records.   Past Medical History:  Diagnosis Date   Anxiety    Asthma    Bipolar disorder ([place])    C. difficile diarrhea    from Abx 2010 hospitalized    Cataract    s/p b/l repair    Chicken pox    CKD (chronic kidney disease)    CKD (chronic kidney disease), stage III (HCC)    a. s/p R nephrectomy./ aneurysm   Conversion disorder    COPD (chronic obstructive pulmonary disease) (HCC)    Depression    Essential hypertension    GERD (gastroesophageal reflux disease)    Hyperlipidemia    ILD (interstitial lung disease) (HCC)    mild; 2/2 RA diagnosis   Inflammatory arthritis    a. hands/carpal tunnel.  b. Low titer rheumatoid factor. c. Negative anti-CCP antibodies. d. Plaquenil.   Macular degeneration    Nocturnal hypoxemia    Non-Obstructive CAD    a. 07/2009 Cath ([facility]): nonobs dzs;  b. 03/2011 Cath [facility]): nonobs dzs.   Osteoarthritis    a. Knees.   PAD (peripheral artery disease) (HCC)    PUD (peptic ulcer disease)    S/P right hip fracture    11/01/16 s/p repair   Shoulder pain    Sleep apnea    no cpap / minimal   Spinal stenosis at L4-L5 level    severe with L4/L5 anterolisthesis grade 1 anterolisthesis    Toxic  maculopathy    Valvular heart disease    a. 07/2015 Echo: EF 55-60%, Mild AI, AS, MR, and TR.    Patient Active Problem List   Diagnosis Date Noted   Acromial process of scapula fracture 12/21/2019   NSIP (nonspecific interstitial pneumonitis) ([place]) 12/08/2019   Iliopsoas bursitis of right hip 08/29/2019   Chronic left shoulder pain 08/29/2019   Overweight (BMI 25.0-29.9) 08/29/2019   Hypertriglyceridemia 07/24/2019   Bipolar disorder, in full remission, most recent episode depressed ([place]) 07/13/2019   Essential hypertension 06/16/2019   Chronic pain 06/16/2019   Lymphedema 06/05/2019   Chronic venous insufficiency 05/25/2019   PAD (peripheral artery disease) ([place]) 05/25/2019   Leg edema 05/02/2019   Episodic mood disorder ([place]) [id number]   Complicated grief [id number]   At high risk for falls 03/16/2019   Pelvic mass in female 03/16/2019   Compression fracture of L4 vertebra ([place]) 02/21/2019   Collapsed vertebra, not elsewhere classified, lumbar region, initial encounter for fracture ([place]) 02/21/2019   Anxiety 01/20/2019   Pain due to onychomycosis of toenails of both feet 12/05/2018   Hav (hallux abducto valgus), unspecified laterality 12/05/2018   Closed fracture of acromial process of scapula 10/28/2018   Status post reverse arthroplasty of shoulder, left 07/26/2018  Interstitial pulmonary disease ([place]) 07/07/2018   Fatigue 07/07/2018   Avascular necrosis of left shoulder due to adverse effect of steroid therapy ([place]) 01/20/2018   Other shoulder lesions, left shoulder 12/20/2017   Status post reverse total shoulder replacement, right 11/04/2017   Leukocytosis 10/19/2017   Allergic rhinitis 09/28/2017   Rotator cuff tendinitis, right 07/26/2017   Strain of right hip 02/26/2017   History of fracture of left hip 01/15/2017   Status post lumbar spinal fusion 01/15/2017   Dislocation of hip joint prosthesis ([place]) 01/08/2017   Status post hip hemiarthroplasty 01/08/2017    Leg swelling 12/15/2016   Age-related osteoporosis without current pathological fracture 11/05/2016   Hip fracture ([place]) 10/31/2016   Polyneuropathy 10/28/2016   Left foot pain 10/19/2016   Spinal stenosis of lumbar region 09/15/2016   Osteoporosis 08/27/2016   Drug-induced osteoporosis 08/27/2016   Adnexal mass 08/25/2016   Prediabetes 08/25/2016   Coronary atherosclerosis 08/25/2016   History of syncope 08/25/2016   Hypokalemia 08/25/2016   Mixed bipolar I disorder ([place]) 10/09/2015   Stage 3b chronic kidney disease ([place])    Conversion disorder 08/04/2015   Hyperlipidemia 07/17/2015   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   Macular degeneration 07/17/2015   Insomnia 07/17/2015   Rheumatoid arthritis ([place]) 12/05/2014   Other specified postprocedural states 07/20/2013   Anxiety and depression 06/13/2013   Osteoarthritis of knee 06/07/2013   Cough 05/02/2012   Valvular heart disease 10/13/2011   Asthma-COPD overlap syndrome ([place]) 09/09/2011   OSA (obstructive sleep apnea) 09/09/2011   Nocturnal hypoxemia 09/09/2011   Gastroesophageal reflux disease 02/24/2011   Single kidney 02/24/2011   H/O cardiac catheterization 02/24/2011   Renal artery stenosis ([place]) 02/24/2011    Past Surgical History:  Procedure Laterality Date   APPENDECTOMY     BACK SURGERY     lumbar fusion   BUNIONECTOMY Right    CATARACT EXTRACTION, BILATERAL     CESAREAN SECTION     x1   CHOLECYSTECTOMY N/A 05/11/2016   Procedure: LAPAROSCOPIC CHOLECYSTECTOMY;  Surgeon: Florene Glen, MD;  Location: [facility] ORS;  Service: General;  Laterality: N/A;   COLONOSCOPY WITH PROPOFOL N/A 04/02/2016   Procedure: COLONOSCOPY WITH PROPOFOL;  Surgeon: Jonathon Bellows, MD;  Location: [facility];  Service: Endoscopy;  Laterality: N/A;   ENDOSCOPIC RETROGRADE CHOLANGIOPANCREATOGRAPHY (ERCP) WITH PROPOFOL N/A 05/08/2016   Procedure: ENDOSCOPIC RETROGRADE CHOLANGIOPANCREATOGRAPHY (ERCP) WITH PROPOFOL;   Surgeon: Lucilla Lame, MD;  Location: [facility];  Service: Endoscopy;  Laterality: N/A;   ERCP     with biliary spincterotomy 05/08/16 Dr. Allen Norris for choledocholithiasis    ESOPHAGEAL DILATION  04/02/2016   Procedure: ESOPHAGEAL DILATION;  Surgeon: Jonathon Bellows, MD;  Location: [facility];  Service: Endoscopy;;   ESOPHAGOGASTRODUODENOSCOPY (EGD) WITH PROPOFOL N/A 04/02/2016   Procedure: ESOPHAGOGASTRODUODENOSCOPY (EGD) WITH PROPOFOL;  Surgeon: Jonathon Bellows, MD;  Location: [facility];  Service: Endoscopy;  Laterality: N/A;   HIP ARTHROPLASTY Right 11/01/2016   Procedure: ARTHROPLASTY BIPOLAR HIP (HEMIARTHROPLASTY);  Surgeon: Corky Mull, MD;  Location: [facility];  Service: Orthopedics;  Laterality: Right;   NEPHRECTOMY  1988   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   ORIF SCAPHOID FRACTURE Left 12/21/2019   Procedure: OPEN REDUCTION INTERNAL FIXATION (ORIF) OF LEFT SCAPULAR NONUNION WITH BONE GRAFT;  Surgeon: Corky Mull, MD;  Location: [facility];  Service: Orthopedics;  Laterality: Left;   osteoporosis     noted DEXA 08/19/16    REPLACEMENT TOTAL KNEE Right  REVERSE SHOULDER ARTHROPLASTY Right 11/04/2017   Procedure: REVERSE SHOULDER ARTHROPLASTY;  Surgeon: Corky Mull, MD;  Location: [facility];  Service: Orthopedics;  Laterality: Right;   REVERSE SHOULDER ARTHROPLASTY Left 07/26/2018   Procedure: REVERSE SHOULDER ARTHROPLASTY;  Surgeon: Corky Mull, MD;  Location: [facility];  Service: Orthopedics;  Laterality: Left;   TONSILLECTOMY     TOTAL HIP ARTHROPLASTY  12/10/11   [facility] left hip   TOTAL HIP ARTHROPLASTY Bilateral    TUBAL LIGATION      OB History     Gravida  4   Para  3   Term  2   Preterm  1   AB  1   Living  2      SAB  1   IAB      Ectopic      Multiple      Live Births  2            Home Medications    Prior to Admission medications   Medication Sig Start Date End Date Taking? Authorizing Provider  predniSONE  (STERAPRED UNI-PAK 21 TAB) 10 MG (21) TBPK tablet Take by mouth daily. As directed 09/24/20  Yes Sharion Balloon, NP  Adalimumab 40 MG/0.4ML PNKT Inject 40 mg into the skin every 14 (fourteen) days.     [provider]  albuterol (VENTOLIN HFA) 108 (90 Base) MCG/ACT inhaler Inhale 2 puffs into the lungs every 6 (six) hours as needed for wheezing or shortness of breath. 09/03/20   McLean-Scocuzza, Nino Glow, MD  ALPRAZolam (XANAX) 0.25 MG tablet TAKE 1 TABLET BY MOUTH AT BEDTIME AS NEEDED FOR ANXIETY. 06/20/20   McLean-Scocuzza, Nino Glow, MD  amLODipine (NORVASC) 2.5 MG tablet Take 1 tablet (2.5 mg total) by mouth daily. 09/03/20   McLean-Scocuzza, Nino Glow, MD  amoxicillin-clavulanate (AUGMENTIN) 875-125 MG tablet Take 1 tablet by mouth 2 (two) times daily. With food 09/03/20   McLean-Scocuzza, Nino Glow, MD  benzonatate (TESSALON) 200 MG capsule Take 1 capsule (200 mg total) by mouth 3 (three) times daily as needed for cough. 09/03/20   McLean-Scocuzza, Nino Glow, MD  budesonide (PULMICORT) 0.25 MG/2ML nebulizer solution USE 1 VIAL  IN  NEBULIZER TWICE  DAILY - rinse mouth after treatment 12/01/19   [name], MD  dicyclomine (BENTYL) 10 MG capsule TAKE 1 CAPSULE (10 MG TOTAL) BY MOUTH 4 (FOUR) TIMES DAILY - BEFORE MEALS AND AT BEDTIME. Patient taking differently: Take 20 mg by mouth in the morning and at bedtime. 01/17/19 09/06/20  Jonathon Bellows, MD  escitalopram (LEXAPRO) 10 MG tablet TAKE 1 TABLET BY MOUTH EVERY DAY 09/13/20   McLean-Scocuzza, Nino Glow, MD  etodolac (LODINE) 500 MG tablet Take 500 mg by mouth at bedtime. 03/22/20   [provider]  famotidine (PEPCID) 20 MG tablet TAKE 1 TABLET (20 MG TOTAL) BY MOUTH DAILY. BEFORE BREAKFAST OR DINNER 05/23/20   McLean-Scocuzza, Nino Glow, MD  fluticasone (FLONASE) 50 MCG/ACT nasal spray Place 2 sprays into both nostrils daily. In am 12/08/19   McLean-Scocuzza, Nino Glow, MD  gabapentin (NEURONTIN) 300 MG capsule Take 2 capsules (600 mg total) by mouth in  the morning and at bedtime. 09/03/20   McLean-Scocuzza, Nino Glow, MD  ipratropium-albuterol (DUONEB) 0.5-2.5 (3) MG/3ML SOLN TAKE 3 MLS BY NEBULIZATION 3 (THREE) TIMES DAILY AS NEEDED. 07/29/20   [name], MD  lamoTRIgine (LAMICTAL) 100 MG tablet TAKE 1 TAB 2 TIMES DAILY. FURTHER REFILLS NEW PSYCH FOR ALL PSYCH MEDS  ONLY TEMP SUPPLY FROM PCP 09/03/20   McLean-Scocuzza, Nino Glow, MD  leflunomide (ARAVA) 20 MG tablet Take 1 tablet (20 mg total) by mouth daily. 09/30/17   McLean-Scocuzza, Nino Glow, MD  lovastatin (MEVACOR) 20 MG tablet TAKE 1 TABLET BY MOUTH EVERYDAY AT BEDTIME *STOP TALKING '40MG'$ * 07/12/20   McLean-Scocuzza, Nino Glow, MD  montelukast (SINGULAIR) 10 MG tablet TAKE 1 TABLET BY MOUTH EVERY DAY 08/09/20   McLean-Scocuzza, Nino Glow, MD  MYRBETRIQ 50 MG TB24 tablet TAKE 1 TABLET BY MOUTH EVERYDAY AT BEDTIME 04/29/20   Vaillancourt, Samantha, PA-C  nebivolol (BYSTOLIC) 10 MG tablet Take 0.5 tablets (5 mg total) by mouth in the morning and at bedtime. 12/08/19   McLean-Scocuzza, Nino Glow, MD  pantoprazole (PROTONIX) 40 MG tablet TAKE 1 TABLET BY MOUTH 2 TIMES DAILY 30 MIN BEFORE FOOD (NOTE REDUCTION IN FREQUENCY) 07/02/20   McLean-Scocuzza, Nino Glow, MD  predniSONE (DELTASONE) 5 MG tablet TAKE 1.5 TABLETS (7.5 MG TOTAL) BY MOUTH DAILY WITH BREAKFAST. Patient taking differently: Take 5 mg by mouth daily with breakfast. 07/08/20   [name], MD  QUEtiapine (SEROQUEL) 25 MG tablet TAKE 1 TABLET (25 MG TOTAL) BY MOUTH AT BEDTIME. AGAIN LAST FILL FURTHER REFILLS FROM PSYCHIATRY NO EXCEPTIONS 09/13/20   McLean-Scocuzza, Nino Glow, MD  sucralfate (CARAFATE) 1 g tablet Take 1 tablet (1 g total) by mouth 2 (two) times daily. 09/17/20 09/12/21  Jonathon Bellows, MD  zoledronic acid (RECLAST) 5 MG/100ML SOLN injection Inject 5 mg into the vein once.    [provider]    Family History Family History  Problem Relation Age of Onset   Rheum arthritis Mother    Asthma Mother    Parkinson's disease Mother     Heart disease Mother    Stroke Mother    Hypertension Mother    Heart attack Father    Heart disease Father    Hypertension Father    Peripheral Artery Disease Father    Diabetes Son    Gout Son    Asthma Sister    Heart disease Sister    Lung cancer Sister    Heart disease Sister    Heart disease Sister    Breast cancer Sister    Heart attack Sister    Heart disease Brother    Heart disease Maternal Grandmother    Diabetes Maternal Grandmother    Colon cancer Maternal Grandmother    Cancer Maternal Grandmother        Hodgkins lymphoma   Heart disease Brother    Alcohol abuse Brother    Depression Brother    Dementia Son     Social History Social History   Tobacco Use   Smoking status: Former    Packs/day: 0.50    Years: 20.00    Pack years: 10.00    Types: Cigarettes    Quit date: 02/02/1974    Years since quitting: 46.6   Smokeless tobacco: Never  Vaping Use   Vaping Use: Never used  Substance Use Topics   Alcohol use: No   Drug use: No     Allergies   Ceftin [cefuroxime axetil]; Lisinopril; Sulfa antibiotics; Sulfasalazine; Morphine; Xarelto [rivaroxaban]; Adhesive [tape]; Antihistamines, chlorpheniramine-type; Antivert [meclizine hcl]; Aspirin; Contrast media [iodinated diagnostic agents]; Decongestant [pseudoephedrine hcl]; Doxycycline; Levaquin [levofloxacin in d5w]; Polymyxin b; and Tetanus toxoids   Review of Systems Review of Systems  Constitutional:  Negative for chills and fever.  Respiratory:  Negative for cough and shortness of breath.   Cardiovascular:  Negative for chest pain and palpitations.  Gastrointestinal:  Negative for abdominal pain and vomiting.  Genitourinary:  Negative for dysuria and hematuria.  Musculoskeletal:  Positive for arthralgias, back pain and gait problem.  Skin:  Negative for color change and rash.  Neurological:  Positive for numbness. Negative for syncope.  All other systems reviewed and are negative.   Physical  Exam Triage Vital Signs ED Triage Vitals  Enc Vitals Group     BP 09/24/20 1712 (!) 178/68     Pulse Rate 09/24/20 1712 79     Resp 09/24/20 1712 20     Temp 09/24/20 1712 98 F (36.7 C)     Temp Source 09/24/20 1712 Oral     SpO2 09/24/20 1712 92 %     Weight --      Height --      Head Circumference --      Peak Flow --      Pain Score 09/24/20 1719 10     Pain Loc --      Pain Edu? --      Excl. in [place]? --    No data found.  Updated Vital Signs BP (!) 178/68 (BP Location: Left Arm)   Pulse 79   Temp 98 F (36.7 C) (Oral)   Resp 20   SpO2 92%   Visual Acuity Right Eye Distance:   Left Eye Distance:   Bilateral Distance:    Right Eye Near:   Left Eye Near:    Bilateral Near:     Physical Exam Vitals and nursing note reviewed.  Constitutional:      General: She is not in acute distress.    Appearance: She is well-developed.  HENT:     Head: Normocephalic and atraumatic.     Mouth/Throat:     Mouth: Mucous membranes are moist.  Eyes:     Conjunctiva/sclera: Conjunctivae normal.  Cardiovascular:     Rate and Rhythm: Normal rate and regular rhythm.     Heart sounds: Normal heart sounds.  Pulmonary:     Effort: Pulmonary effort is normal. No respiratory distress.     Breath sounds: Normal breath sounds.  Abdominal:     Palpations: Abdomen is soft.     Tenderness: There is no abdominal tenderness. There is no guarding or rebound.  Musculoskeletal:     Cervical back: Neck supple.  Skin:    General: Skin is warm and dry.     Findings: No bruising, erythema, lesion or rash.  Neurological:     General: No focal deficit present.     Mental Status: She is alert and oriented to person, place, and time.     Sensory: No sensory deficit.     Motor: No weakness.     Gait: Gait abnormal.     Comments: Ambulatory with cane.  Psychiatric:        Mood and Affect: Mood normal.        Behavior: Behavior normal.     UC Treatments / Results  Labs (all labs  ordered are listed, but only abnormal results are displayed) Labs Reviewed - No data to display  EKG   Radiology No results found.  Procedures Procedures (including critical care time)  Medications Ordered in UC Medications - No data to display  Initial Impression / Assessment and Plan / UC Course  I have reviewed the triage vital signs and the nursing notes.  Pertinent labs & imaging results that were available during my  care of the patient were reviewed by me and considered in my medical decision making (see chart for details).  Left side sciatica.  Elevated blood pressure reading with known hypertension.  Treating sciatica with prednisone taper.  Instructed patient to pause taking her daily dose of prednisone until she completes the taper, then resume daily prednisone as directed by her PCP.  Education provided on sciatica.  Instructed her to follow-up with her PCP or orthopedist if her symptoms are not improving.  Also discussed that her blood pressure is elevated today needs to be rechecked by her PCP in 1 to 2 weeks.  Education provided on managing hypertension.  Patient agrees to plan of care.   Final Clinical Impressions(s) / UC Diagnoses   Final diagnoses:  Sciatica of left side  Elevated blood pressure reading in office with diagnosis of hypertension     Discharge Instructions      Take the prednisone as directed.  Follow-up with your primary care provider or orthopedist if your symptoms are not improving.  Your blood pressure is elevated today at 178/68.  Please have this rechecked by your primary care provider in 1-2 weeks.          ED Prescriptions     Medication Sig Dispense Auth. Provider   predniSONE (STERAPRED UNI-PAK 21 TAB) 10 MG (21) TBPK tablet Take by mouth daily. As directed 21 tablet Sharion Balloon, NP      PDMP not reviewed this encounter.   Sharion Balloon, NP 09/24/20 1802    Sharion Balloon, NP 09/24/20 [phone]

## 2020-09-24 NOTE — ED Triage Notes (Signed)
Pt here with right throbbing groin pain and left sciatica pain down the back of her leg. Also complains of bilateral lower leg numbness while laying down every night. Sx for 1 week.

## 2020-09-24 NOTE — Discharge Instructions (Addendum)
Take the prednisone as directed.  Follow-up with your primary care provider or orthopedist if your symptoms are not improving.  Your blood pressure is elevated today at 178/68.  Please have this rechecked by your primary care provider in 1-2 weeks.

## 2020-09-24 NOTE — Telephone Encounter (Signed)
Called and spoke to Kerri Carter. Kerri Carter states that she has been experiencing pain in her groin that has been lasting for a week. Pt states that she has no kidney on her right side and that her urine is normal. Pt also c/o of pain in her Left buttock that goes "from her buttock down to her left foot". Pt states that she has not fallen or tripped recently to cause any pain. Pt states that she can not go to the urgent care right now as she has no transportation and she will wait for her son to get off work to take her to the urgent care in [place].

## 2020-09-25 ENCOUNTER — Ambulatory Visit: Payer: Medicare PPO

## 2020-09-25 NOTE — Telephone Encounter (Signed)
Reviewed message. Pt seen - urgent care.

## 2020-09-26 ENCOUNTER — Ambulatory Visit: Payer: Medicare PPO

## 2020-09-26 DIAGNOSIS — N1832 Chronic kidney disease, stage 3b: Secondary | ICD-10-CM | POA: Diagnosis not present

## 2020-09-26 DIAGNOSIS — Z905 Acquired absence of kidney: Secondary | ICD-10-CM | POA: Diagnosis not present

## 2020-09-26 DIAGNOSIS — N2581 Secondary hyperparathyroidism of renal origin: Secondary | ICD-10-CM | POA: Diagnosis not present

## 2020-09-26 DIAGNOSIS — I10 Essential (primary) hypertension: Secondary | ICD-10-CM | POA: Diagnosis not present

## 2020-09-27 DIAGNOSIS — M48062 Spinal stenosis, lumbar region with neurogenic claudication: Secondary | ICD-10-CM | POA: Diagnosis not present

## 2020-10-01 ENCOUNTER — Other Ambulatory Visit: Payer: Self-pay

## 2020-10-01 ENCOUNTER — Other Ambulatory Visit: Payer: Self-pay | Admitting: Pulmonary Disease

## 2020-10-01 MED ORDER — DICYCLOMINE HCL 10 MG PO CAPS: 10.0000 mg | ORAL_CAPSULE | Freq: Two times a day (BID) | ORAL | 3 refills | Status: DC | PRN

## 2020-10-03 ENCOUNTER — Ambulatory Visit: Payer: Medicare PPO

## 2020-10-04 DIAGNOSIS — I130 Hypertensive heart and chronic kidney disease with heart failure and stage 1 through stage 4 chronic kidney disease, or unspecified chronic kidney disease: Secondary | ICD-10-CM | POA: Diagnosis not present

## 2020-10-04 DIAGNOSIS — M069 Rheumatoid arthritis, unspecified: Secondary | ICD-10-CM | POA: Diagnosis not present

## 2020-10-04 DIAGNOSIS — I2510 Atherosclerotic heart disease of native coronary artery without angina pectoris: Secondary | ICD-10-CM | POA: Diagnosis not present

## 2020-10-04 DIAGNOSIS — N189 Chronic kidney disease, unspecified: Secondary | ICD-10-CM | POA: Diagnosis not present

## 2020-10-04 DIAGNOSIS — J449 Chronic obstructive pulmonary disease, unspecified: Secondary | ICD-10-CM | POA: Diagnosis not present

## 2020-10-04 DIAGNOSIS — I509 Heart failure, unspecified: Secondary | ICD-10-CM | POA: Diagnosis not present

## 2020-10-04 DIAGNOSIS — F32A Depression, unspecified: Secondary | ICD-10-CM | POA: Diagnosis not present

## 2020-10-04 DIAGNOSIS — E785 Hyperlipidemia, unspecified: Secondary | ICD-10-CM | POA: Diagnosis not present

## 2020-10-04 DIAGNOSIS — D631 Anemia in chronic kidney disease: Secondary | ICD-10-CM | POA: Diagnosis not present

## 2020-10-07 DIAGNOSIS — M069 Rheumatoid arthritis, unspecified: Secondary | ICD-10-CM | POA: Diagnosis not present

## 2020-10-07 DIAGNOSIS — E785 Hyperlipidemia, unspecified: Secondary | ICD-10-CM | POA: Diagnosis not present

## 2020-10-07 DIAGNOSIS — I130 Hypertensive heart and chronic kidney disease with heart failure and stage 1 through stage 4 chronic kidney disease, or unspecified chronic kidney disease: Secondary | ICD-10-CM | POA: Diagnosis not present

## 2020-10-07 DIAGNOSIS — F32A Depression, unspecified: Secondary | ICD-10-CM | POA: Diagnosis not present

## 2020-10-07 DIAGNOSIS — I509 Heart failure, unspecified: Secondary | ICD-10-CM | POA: Diagnosis not present

## 2020-10-07 DIAGNOSIS — D631 Anemia in chronic kidney disease: Secondary | ICD-10-CM | POA: Diagnosis not present

## 2020-10-07 DIAGNOSIS — N189 Chronic kidney disease, unspecified: Secondary | ICD-10-CM | POA: Diagnosis not present

## 2020-10-07 DIAGNOSIS — J449 Chronic obstructive pulmonary disease, unspecified: Secondary | ICD-10-CM | POA: Diagnosis not present

## 2020-10-07 DIAGNOSIS — I2510 Atherosclerotic heart disease of native coronary artery without angina pectoris: Secondary | ICD-10-CM | POA: Diagnosis not present

## 2020-10-08 ENCOUNTER — Emergency Department: Payer: Medicare PPO

## 2020-10-08 ENCOUNTER — Telehealth: Payer: Self-pay

## 2020-10-08 ENCOUNTER — Emergency Department
Admission: EM | Admit: 2020-10-08 | Discharge: 2020-10-08 | Disposition: A | Discharge status: Home / Self Care | Payer: Medicare PPO | Attending: Emergency Medicine | Admitting: Emergency Medicine

## 2020-10-08 ENCOUNTER — Other Ambulatory Visit: Payer: Medicare PPO | Attending: Pulmonary Disease

## 2020-10-08 DIAGNOSIS — Z96643 Presence of artificial hip joint, bilateral: Secondary | ICD-10-CM | POA: Insufficient documentation

## 2020-10-08 DIAGNOSIS — R110 Nausea: Secondary | ICD-10-CM | POA: Diagnosis not present

## 2020-10-08 DIAGNOSIS — J9811 Atelectasis: Secondary | ICD-10-CM | POA: Diagnosis not present

## 2020-10-08 DIAGNOSIS — R0789 Other chest pain: Secondary | ICD-10-CM | POA: Diagnosis not present

## 2020-10-08 DIAGNOSIS — Z96611 Presence of right artificial shoulder joint: Secondary | ICD-10-CM | POA: Insufficient documentation

## 2020-10-08 DIAGNOSIS — Z20822 Contact with and (suspected) exposure to covid-19: Secondary | ICD-10-CM | POA: Diagnosis not present

## 2020-10-08 DIAGNOSIS — I129 Hypertensive chronic kidney disease with stage 1 through stage 4 chronic kidney disease, or unspecified chronic kidney disease: Secondary | ICD-10-CM | POA: Insufficient documentation

## 2020-10-08 DIAGNOSIS — J449 Chronic obstructive pulmonary disease, unspecified: Secondary | ICD-10-CM | POA: Insufficient documentation

## 2020-10-08 DIAGNOSIS — R079 Chest pain, unspecified: Secondary | ICD-10-CM | POA: Diagnosis not present

## 2020-10-08 DIAGNOSIS — I700 Atherosclerosis of aorta: Secondary | ICD-10-CM | POA: Diagnosis not present

## 2020-10-08 DIAGNOSIS — R944 Abnormal results of kidney function studies: Secondary | ICD-10-CM | POA: Diagnosis not present

## 2020-10-08 DIAGNOSIS — Z79899 Other long term (current) drug therapy: Secondary | ICD-10-CM | POA: Diagnosis not present

## 2020-10-08 DIAGNOSIS — M48062 Spinal stenosis, lumbar region with neurogenic claudication: Secondary | ICD-10-CM | POA: Diagnosis not present

## 2020-10-08 DIAGNOSIS — Z91041 Radiographic dye allergy status: Secondary | ICD-10-CM | POA: Insufficient documentation

## 2020-10-08 DIAGNOSIS — M5442 Lumbago with sciatica, left side: Secondary | ICD-10-CM | POA: Diagnosis not present

## 2020-10-08 DIAGNOSIS — J45909 Unspecified asthma, uncomplicated: Secondary | ICD-10-CM | POA: Diagnosis not present

## 2020-10-08 DIAGNOSIS — Z87891 Personal history of nicotine dependence: Secondary | ICD-10-CM | POA: Insufficient documentation

## 2020-10-08 DIAGNOSIS — N1832 Chronic kidney disease, stage 3b: Secondary | ICD-10-CM | POA: Diagnosis not present

## 2020-10-08 DIAGNOSIS — Z96612 Presence of left artificial shoulder joint: Secondary | ICD-10-CM | POA: Diagnosis not present

## 2020-10-08 DIAGNOSIS — M5441 Lumbago with sciatica, right side: Secondary | ICD-10-CM | POA: Diagnosis not present

## 2020-10-08 DIAGNOSIS — Z96651 Presence of right artificial knee joint: Secondary | ICD-10-CM | POA: Insufficient documentation

## 2020-10-08 DIAGNOSIS — Z7951 Long term (current) use of inhaled steroids: Secondary | ICD-10-CM | POA: Diagnosis not present

## 2020-10-08 DIAGNOSIS — G8929 Other chronic pain: Secondary | ICD-10-CM | POA: Diagnosis not present

## 2020-10-08 LAB — CBC
HCT: 39.4 % (ref 36.0–46.0)
Hemoglobin: 12.9 g/dL (ref 12.0–15.0)
MCH: 29.3 pg (ref 26.0–34.0)
MCHC: 32.7 g/dL (ref 30.0–36.0)
MCV: 89.5 fL (ref 80.0–100.0)
Platelets: 218 10*3/uL (ref 150–400)
RBC: 4.4 MIL/uL (ref 3.87–5.11)
RDW: 14.7 % (ref 11.5–15.5)
WBC: 11.6 10*3/uL — ABNORMAL HIGH (ref 4.0–10.5)
nRBC: 0 % (ref 0.0–0.2)

## 2020-10-08 LAB — BASIC METABOLIC PANEL
Anion gap: 9 (ref 5–15)
BUN: 19 mg/dL (ref 8–23)
CO2: 28 mmol/L (ref 22–32)
Calcium: 9.1 mg/dL (ref 8.9–10.3)
Chloride: 104 mmol/L (ref 98–111)
Creatinine, Ser: 1.42 mg/dL — ABNORMAL HIGH (ref 0.44–1.00)
GFR, Estimated: 38 mL/min — ABNORMAL LOW (ref >60)
Glucose, Bld: 134 mg/dL — ABNORMAL HIGH (ref 70–99)
Potassium: 4.1 mmol/L (ref 3.5–5.1)
Sodium: 141 mmol/L (ref 135–145)

## 2020-10-08 LAB — TROPONIN I (HIGH SENSITIVITY)
Troponin I (High Sensitivity): 17 ng/L (ref <18)
Troponin I (High Sensitivity): 16 ng/L (ref <18)

## 2020-10-08 LAB — RESP PANEL BY RT-PCR (FLU A&B, COVID) ARPGX2
Influenza A by PCR: NEGATIVE
Influenza B by PCR: NEGATIVE
SARS Coronavirus 2 by RT PCR: NEGATIVE

## 2020-10-08 MED ORDER — GADOBUTROL 1 MMOL/ML IV SOLN
9.0000 mL | Freq: Once | INTRAVENOUS | Status: AC | PRN
  Administered 2020-10-08: 9 mL via INTRAVENOUS

## 2020-10-08 NOTE — ED Notes (Signed)
Pt was put in room from triage. VS are overdue and being obtained now. Lab was just at bedside obtaining labs. This nurse did not call lab for lab draw.

## 2020-10-08 NOTE — Telephone Encounter (Signed)
Pt aware of upcoming COVID test, nothing further needed.

## 2020-10-08 NOTE — ED Provider Notes (Signed)
Care assumed from Dr. Rip Harbour.  Advises patient with chest pain while at physician's office today.  This has resolved.  Recommends that we evaluate with second troponin and MRA of the chest.  If MRA and troponin #2 remain normal (no dissection, no acs) patient likely appropriate for discharge.   Delman Kitten, MD 10/08/20 [phone]

## 2020-10-08 NOTE — ED Provider Notes (Signed)
I did not participate in the care of this patient.  Patient was managed entirely by Dr. Annabell Howells, MD 10/09/20 [phone]

## 2020-10-08 NOTE — ED Notes (Signed)
Lab drew second troponin.

## 2020-10-08 NOTE — ED Notes (Signed)
Pt to MRI

## 2020-10-08 NOTE — ED Provider Notes (Signed)
I had planned on signing this patient out but I stood talking to the patient long enough that all the test came back the MRI and troponin are negative.  I will discharge patient with follow-up with cardiology she will return here if she is worse.  Pain could have been esophageal spasm or something similar.  It is unlikely to be gallbladder she does not have her gallbladder any longer and she has not had any problems with her gallbladder since having it out.  Does not sound like pulmonary embolus or other lung generated chest pain like pneumonia.  I will have her follow-up with cardiology though.  I will have her follow-up either with her cardiologist or the person on-call for [facility] clinic.   Nena Polio, MD 10/08/20 [phone]

## 2020-10-08 NOTE — ED Provider Notes (Signed)
[facility] Emergency Department Provider Note  ____________________________________________   Event Date/Time   First MD Initiated Contact with Patient 10/08/20 1421     (approximate)  I have reviewed the triage vital signs and the nursing notes.   HISTORY  Chief Complaint Chest Pain, Nausea, and Abdominal Pain (RLQ)    HPI Kerri Carter is a 80 y.o. female who was going off to see orthopedics to [facility] clinic today and developed sudden onset of severe central chest pain rating through to the back with cold sweats.  Patient was sent to the emergency room for evaluation and while waiting to get back here the pain has resolved.  Patient reports she does have this kind of pain on occasion at home.  Its not related to exertion and was not related to exertion today except for that she had gotten up from the chair in the waiting room and walked back to the room to see the doctor.  Pain lasted possibly perhaps an hour or so and has as I said resolved.  The patient does not have an she says did not had abdominal pain today.  She did have some nausea.         Past Medical History:  Diagnosis Date   Anxiety    Asthma    Bipolar disorder ([place])    C. difficile diarrhea    from Abx 2010 hospitalized    Cataract    s/p b/l repair    Chicken pox    CKD (chronic kidney disease)    CKD (chronic kidney disease), stage III (HCC)    a. s/p R nephrectomy./ aneurysm   Conversion disorder    COPD (chronic obstructive pulmonary disease) (HCC)    Depression    Essential hypertension    GERD (gastroesophageal reflux disease)    Hyperlipidemia    ILD (interstitial lung disease) (HCC)    mild; 2/2 RA diagnosis   Inflammatory arthritis    a. hands/carpal tunnel.  b. Low titer rheumatoid factor. c. Negative anti-CCP antibodies. d. Plaquenil.   Macular degeneration    Nocturnal hypoxemia    Non-Obstructive CAD    a. 07/2009 Cath ([facility]): nonobs dzs;  b. 03/2011 Cath  [facility]): nonobs dzs.   Osteoarthritis    a. Knees.   PAD (peripheral artery disease) (HCC)    PUD (peptic ulcer disease)    S/P right hip fracture    11/01/16 s/p repair   Shoulder pain    Sleep apnea    no cpap / minimal   Spinal stenosis at L4-L5 level    severe with L4/L5 anterolisthesis grade 1 anterolisthesis    Toxic maculopathy    Valvular heart disease    a. 07/2015 Echo: EF 55-60%, Mild AI, AS, MR, and TR.    Patient Active Problem List   Diagnosis Date Noted   Acromial process of scapula fracture 12/21/2019   NSIP (nonspecific interstitial pneumonitis) ([place]) 12/08/2019   Iliopsoas bursitis of right hip 08/29/2019   Chronic left shoulder pain 08/29/2019   Overweight (BMI 25.0-29.9) 08/29/2019   Hypertriglyceridemia 07/24/2019   Bipolar disorder, in full remission, most recent episode depressed ([place]) 07/13/2019   Essential hypertension 06/16/2019   Chronic pain 06/16/2019   Lymphedema 06/05/2019   Chronic venous insufficiency 05/25/2019   PAD (peripheral artery disease) ([place]) 05/25/2019   Leg edema 05/02/2019   Episodic mood disorder ([place]) [id number]   Complicated grief [id number]   At high risk for falls 03/16/2019   Pelvic mass  in female 03/16/2019   Compression fracture of L4 vertebra ([place]) 02/21/2019   Collapsed vertebra, not elsewhere classified, lumbar region, initial encounter for fracture ([place]) 02/21/2019   Anxiety 01/20/2019   Pain due to onychomycosis of toenails of both feet 12/05/2018   Hav (hallux abducto valgus), unspecified laterality 12/05/2018   Closed fracture of acromial process of scapula 10/28/2018   Status post reverse arthroplasty of shoulder, left 07/26/2018   Interstitial pulmonary disease ([place]) 07/07/2018   Fatigue 07/07/2018   Avascular necrosis of left shoulder due to adverse effect of steroid therapy ([place]) 01/20/2018   Other shoulder lesions, left shoulder 12/20/2017   Status post reverse total shoulder replacement, right 11/04/2017    Leukocytosis 10/19/2017   Allergic rhinitis 09/28/2017   Rotator cuff tendinitis, right 07/26/2017   Strain of right hip 02/26/2017   History of fracture of left hip 01/15/2017   Status post lumbar spinal fusion 01/15/2017   Dislocation of hip joint prosthesis ([place]) 01/08/2017   Status post hip hemiarthroplasty 01/08/2017   Leg swelling 12/15/2016   Age-related osteoporosis without current pathological fracture 11/05/2016   Hip fracture ([place]) 10/31/2016   Polyneuropathy 10/28/2016   Left foot pain 10/19/2016   Spinal stenosis of lumbar region 09/15/2016   Osteoporosis 08/27/2016   Drug-induced osteoporosis 08/27/2016   Adnexal mass 08/25/2016   Prediabetes 08/25/2016   Coronary atherosclerosis 08/25/2016   History of syncope 08/25/2016   Hypokalemia 08/25/2016   Mixed bipolar I disorder ([place]) 10/09/2015   Stage 3b chronic kidney disease ([place])    Conversion disorder 08/04/2015   Hyperlipidemia 07/17/2015   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   Macular degeneration 07/17/2015   Insomnia 07/17/2015   Rheumatoid arthritis ([place]) 12/05/2014   Other specified postprocedural states 07/20/2013   Anxiety and depression 06/13/2013   Osteoarthritis of knee 06/07/2013   Cough 05/02/2012   Valvular heart disease 10/13/2011   Asthma-COPD overlap syndrome ([place]) 09/09/2011   OSA (obstructive sleep apnea) 09/09/2011   Nocturnal hypoxemia 09/09/2011   Gastroesophageal reflux disease 02/24/2011   Single kidney 02/24/2011   H/O cardiac catheterization 02/24/2011   Renal artery stenosis ([place]) 02/24/2011    Past Surgical History:  Procedure Laterality Date   APPENDECTOMY     BACK SURGERY     lumbar fusion   BUNIONECTOMY Right    CATARACT EXTRACTION, BILATERAL     CESAREAN SECTION     x1   CHOLECYSTECTOMY N/A 05/11/2016   Procedure: LAPAROSCOPIC CHOLECYSTECTOMY;  Surgeon: Florene Glen, MD;  Location: [facility] ORS;  Service: General;  Laterality: N/A;    COLONOSCOPY WITH PROPOFOL N/A 04/02/2016   Procedure: COLONOSCOPY WITH PROPOFOL;  Surgeon: Jonathon Bellows, MD;  Location: [facility] ENDOSCOPY;  Service: Endoscopy;  Laterality: N/A;   ENDOSCOPIC RETROGRADE CHOLANGIOPANCREATOGRAPHY (ERCP) WITH PROPOFOL N/A 05/08/2016   Procedure: ENDOSCOPIC RETROGRADE CHOLANGIOPANCREATOGRAPHY (ERCP) WITH PROPOFOL;  Surgeon: Lucilla Lame, MD;  Location: [facility] ENDOSCOPY;  Service: Endoscopy;  Laterality: N/A;   ERCP     with biliary spincterotomy 05/08/16 Dr. Allen Norris for choledocholithiasis    ESOPHAGEAL DILATION  04/02/2016   Procedure: ESOPHAGEAL DILATION;  Surgeon: Jonathon Bellows, MD;  Location: [facility] ENDOSCOPY;  Service: Endoscopy;;   ESOPHAGOGASTRODUODENOSCOPY (EGD) WITH PROPOFOL N/A 04/02/2016   Procedure: ESOPHAGOGASTRODUODENOSCOPY (EGD) WITH PROPOFOL;  Surgeon: Jonathon Bellows, MD;  Location: [facility] ENDOSCOPY;  Service: Endoscopy;  Laterality: N/A;   HIP ARTHROPLASTY Right 11/01/2016   Procedure: ARTHROPLASTY BIPOLAR HIP (HEMIARTHROPLASTY);  Surgeon: Corky Mull, MD;  Location: [facility] ORS;  Service: Orthopedics;  Laterality:  Right;   NEPHRECTOMY  1988   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   ORIF SCAPHOID FRACTURE Left 12/21/2019   Procedure: OPEN REDUCTION INTERNAL FIXATION (ORIF) OF LEFT SCAPULAR NONUNION WITH BONE GRAFT;  Surgeon: Corky Mull, MD;  Location: [facility] ORS;  Service: Orthopedics;  Laterality: Left;   osteoporosis     noted DEXA 08/19/16    REPLACEMENT TOTAL KNEE Right    REVERSE SHOULDER ARTHROPLASTY Right 11/04/2017   Procedure: REVERSE SHOULDER ARTHROPLASTY;  Surgeon: Corky Mull, MD;  Location: [facility] ORS;  Service: Orthopedics;  Laterality: Right;   REVERSE SHOULDER ARTHROPLASTY Left 07/26/2018   Procedure: REVERSE SHOULDER ARTHROPLASTY;  Surgeon: Corky Mull, MD;  Location: [facility] ORS;  Service: Orthopedics;  Laterality: Left;   TONSILLECTOMY     TOTAL HIP ARTHROPLASTY  12/10/11   [facility] left hip   TOTAL HIP ARTHROPLASTY Bilateral    TUBAL  LIGATION      Prior to Admission medications   Medication Sig Start Date End Date Taking? Authorizing Provider  Adalimumab 40 MG/0.4ML PNKT Inject 40 mg into the skin every 14 (fourteen) days.     [provider]  albuterol (VENTOLIN HFA) 108 (90 Base) MCG/ACT inhaler Inhale 2 puffs into the lungs every 6 (six) hours as needed for wheezing or shortness of breath. 09/03/20   McLean-Scocuzza, Nino Glow, MD  ALPRAZolam (XANAX) 0.25 MG tablet TAKE 1 TABLET BY MOUTH AT BEDTIME AS NEEDED FOR ANXIETY. 06/20/20   McLean-Scocuzza, Nino Glow, MD  amLODipine (NORVASC) 2.5 MG tablet Take 1 tablet (2.5 mg total) by mouth daily. 09/03/20   McLean-Scocuzza, Nino Glow, MD  amoxicillin-clavulanate (AUGMENTIN) 875-125 MG tablet Take 1 tablet by mouth 2 (two) times daily. With food 09/03/20   McLean-Scocuzza, Nino Glow, MD  benzonatate (TESSALON) 200 MG capsule Take 1 capsule (200 mg total) by mouth 3 (three) times daily as needed for cough. 09/03/20   McLean-Scocuzza, Nino Glow, MD  budesonide (PULMICORT) 0.25 MG/2ML nebulizer solution USE 1 VIAL  IN  NEBULIZER TWICE  DAILY - rinse mouth after treatment 12/01/19   [name], MD  dicyclomine (BENTYL) 10 MG capsule Take 1 capsule (10 mg total) by mouth 2 (two) times daily as needed for spasms. 10/01/20 09/26/21  Jonathon Bellows, MD  escitalopram (LEXAPRO) 10 MG tablet TAKE 1 TABLET BY MOUTH EVERY DAY 09/13/20   McLean-Scocuzza, Nino Glow, MD  etodolac (LODINE) 500 MG tablet Take 500 mg by mouth at bedtime. 03/22/20   [provider]  famotidine (PEPCID) 20 MG tablet TAKE 1 TABLET (20 MG TOTAL) BY MOUTH DAILY. BEFORE BREAKFAST OR DINNER 05/23/20   McLean-Scocuzza, Nino Glow, MD  fluticasone (FLONASE) 50 MCG/ACT nasal spray Place 2 sprays into both nostrils daily. In am 12/08/19   McLean-Scocuzza, Nino Glow, MD  gabapentin (NEURONTIN) 300 MG capsule Take 2 capsules (600 mg total) by mouth in the morning and at bedtime. 09/03/20   McLean-Scocuzza, Nino Glow, MD   ipratropium-albuterol (DUONEB) 0.5-2.5 (3) MG/3ML SOLN TAKE 3 MLS BY NEBULIZATION 3 (THREE) TIMES DAILY AS NEEDED. 07/29/20   [name], MD  lamoTRIgine (LAMICTAL) 100 MG tablet TAKE 1 TAB 2 TIMES DAILY. FURTHER REFILLS NEW PSYCH FOR ALL PSYCH MEDS ONLY TEMP SUPPLY FROM PCP 09/03/20   McLean-Scocuzza, Nino Glow, MD  leflunomide (ARAVA) 20 MG tablet Take 1 tablet (20 mg total) by mouth daily. 09/30/17   McLean-Scocuzza, Nino Glow, MD  lovastatin (MEVACOR) 20 MG tablet TAKE 1 TABLET BY MOUTH EVERYDAY  AT BEDTIME *STOP TALKING '40MG'$ * 07/12/20   McLean-Scocuzza, Nino Glow, MD  montelukast (SINGULAIR) 10 MG tablet TAKE 1 TABLET BY MOUTH EVERY DAY 08/09/20   McLean-Scocuzza, Nino Glow, MD  MYRBETRIQ 50 MG TB24 tablet TAKE 1 TABLET BY MOUTH EVERYDAY AT BEDTIME 04/29/20   Vaillancourt, Samantha, PA-C  nebivolol (BYSTOLIC) 10 MG tablet Take 0.5 tablets (5 mg total) by mouth in the morning and at bedtime. 12/08/19   McLean-Scocuzza, Nino Glow, MD  pantoprazole (PROTONIX) 40 MG tablet TAKE 1 TABLET BY MOUTH 2 TIMES DAILY 30 MIN BEFORE FOOD (NOTE REDUCTION IN FREQUENCY) 07/02/20   McLean-Scocuzza, Nino Glow, MD  predniSONE (DELTASONE) 5 MG tablet TAKE 1.5 TABLETS (7.5 MG TOTAL) BY MOUTH DAILY WITH BREAKFAST. 10/01/20   [name], MD  predniSONE (STERAPRED UNI-PAK 21 TAB) 10 MG (21) TBPK tablet Take by mouth daily. As directed 09/24/20   Sharion Balloon, NP  QUEtiapine (SEROQUEL) 25 MG tablet TAKE 1 TABLET (25 MG TOTAL) BY MOUTH AT BEDTIME. AGAIN LAST FILL FURTHER REFILLS FROM PSYCHIATRY NO EXCEPTIONS 09/13/20   McLean-Scocuzza, Nino Glow, MD  sucralfate (CARAFATE) 1 g tablet Take 1 tablet (1 g total) by mouth 2 (two) times daily. 09/17/20 09/12/21  Jonathon Bellows, MD  zoledronic acid (RECLAST) 5 MG/100ML SOLN injection Inject 5 mg into the vein once.    [provider]    Allergies Ceftin [cefuroxime axetil]; Lisinopril; Sulfa antibiotics; Sulfasalazine; Morphine; Xarelto [rivaroxaban]; Adhesive [tape]; Antihistamines,  chlorpheniramine-type; Antivert [meclizine hcl]; Aspirin; Contrast media [iodinated diagnostic agents]; Decongestant [pseudoephedrine hcl]; Doxycycline; Levaquin [levofloxacin in d5w]; Polymyxin b; and Tetanus toxoids  Family History  Problem Relation Age of Onset   Rheum arthritis Mother    Asthma Mother    Parkinson's disease Mother    Heart disease Mother    Stroke Mother    Hypertension Mother    Heart attack Father    Heart disease Father    Hypertension Father    Peripheral Artery Disease Father    Diabetes Son    Gout Son    Asthma Sister    Heart disease Sister    Lung cancer Sister    Heart disease Sister    Heart disease Sister    Breast cancer Sister    Heart attack Sister    Heart disease Brother    Heart disease Maternal Grandmother    Diabetes Maternal Grandmother    Colon cancer Maternal Grandmother    Cancer Maternal Grandmother        Hodgkins lymphoma   Heart disease Brother    Alcohol abuse Brother    Depression Brother    Dementia Son     Social History Social History   Tobacco Use   Smoking status: Former    Packs/day: 0.50    Years: 20.00    Pack years: 10.00    Types: Cigarettes    Quit date: 02/02/1974    Years since quitting: 46.7   Smokeless tobacco: Never  Vaping Use   Vaping Use: Never used  Substance Use Topics   Alcohol use: No   Drug use: No    Review of Systems  Constitutional: No fever/chills Eyes: No visual changes. ENT: No sore throat. Cardiovascular: Denies chest pain currently, she did have chest pain earlier. Respiratory: Denies shortness of breath. Gastrointestinal: No abdominal pain.  No nausea, no vomiting.  No diarrhea.  No constipation. Genitourinary: Negative for dysuria. Musculoskeletal: Negative for back pain currently. Skin: Negative for rash. Neurological: Negative for headaches, focal weakness  ____________________________________________   PHYSICAL EXAM:  VITAL SIGNS: ED Triage Vitals [10/08/20  1122]  Enc Vitals Group     BP (!) 145/39     Pulse Rate 75     Resp 20     Temp 98.3 F (36.8 C)     Temp Source Oral     SpO2 93 %     Weight      Height      Head Circumference      Peak Flow      Pain Score      Pain Loc      Pain Edu?      Excl. in [place]?     Constitutional: Alert and oriented. Well appearing and in no acute distress. Eyes: Conjunctivae are normal.  Head: Atraumatic. Nose: No congestion/rhinnorhea. Mouth/Throat: Mucous membranes are moist.  Oropharynx non-erythematous. Neck: No stridor.   Cardiovascular: Normal rate, regular rhythm. Grossly normal heart sounds.  Good peripheral circulation. Respiratory: Normal respiratory effort.  No retractions. Lungs CTAB. Gastrointestinal: Soft and nontender. No distention. No abdominal bruits. No CVA tenderness. Musculoskeletal: No lower extremity tenderness nor edema.   Neurologic:  Normal speech and language. No gross focal neurologic deficits are appreciated. No gait instability. Skin:  Skin is warm, dry and intact. No rash noted. Psychiatric: Mood and affect are normal. Speech and behavior are normal.  ____________________________________________   LABS (all labs ordered are listed, but only abnormal results are displayed)  Labs Reviewed  BASIC METABOLIC PANEL - Abnormal; Notable for the following components:      Result Value   Glucose, Bld 134 (*)    Creatinine, Ser 1.42 (*)    GFR, Estimated 38 (*)    All other components within normal limits  CBC - Abnormal; Notable for the following components:   WBC 11.6 (*)    All other components within normal limits  RESP PANEL BY RT-PCR (FLU A&B, COVID) ARPGX2  TROPONIN I (HIGH SENSITIVITY)  TROPONIN I (HIGH SENSITIVITY)   ____________________________________________  EKG  EKG read interpreted by me shows normal sinus rhythm rate of 76 normal axis nonspecific ST-T wave changes ____________________________________________  RADIOLOGY Gertha Calkin,  personally viewed and evaluated these images (plain radiographs) as part of my medical decision making, as well as reviewing the written report by the radiologist.  ED MD interpretation: Chest x-ray read by radiology reviewed by me does not show any acute changes.  Official radiology report(s): DG Chest 2 View  Result Date: 10/08/2020 CLINICAL DATA:  80 year old female with history of sudden onset of chest pain and nausea. EXAM: CHEST - 2 VIEW COMPARISON:  Chest x-ray 03/24/2020. FINDINGS: Lung volumes are low. No consolidative airspace disease. No pleural effusions. No pneumothorax. No pulmonary nodule or mass noted. Pulmonary vasculature and the cardiomediastinal silhouette are within normal limits. Old healed posterolateral right-sided rib fractures are again noted. Status post bilateral shoulder arthroplasty. Status post ORIF in the distal aspect of the left clavicle and acromion. IMPRESSION: 1. Low lung volumes without radiographic evidence of acute cardiopulmonary disease. Electronically Signed   By: Vinnie Langton M.D.   On: 10/08/2020 12:08    ____________________________________________   PROCEDURES  Procedure(s) performed (including Critical Care):  Procedures   ____________________________________________   INITIAL IMPRESSION / ASSESSMENT AND PLAN / ED COURSE  Patient with chest pain suggestive of aortic dissection.  Confounding history of present and that is the chest pain she has had at home several times.  However with the description of  the pain and cold sweats etc. I will go ahead and get an MRI of her chest.  She has a low GFR and an allergy to contrast and I think that would be safer and provide [place] with enough information.  Patient has been stable for the last 3 hours in the waiting room additionally she is now pain-free.  I do not think the length of time will be a problem at this point.  We will also get a second troponin and make sure that that is not going up.  If  everything is negative I anticipate that we will likely have her follow-up with cardiology outpatient.  I have signed this patient out to the oncoming physician.  MRI is just now coming to get the patient.              ____________________________________________   FINAL CLINICAL IMPRESSION(S) / ED DIAGNOSES  Final diagnoses:  Chest pain, unspecified type     ED Discharge Orders     None        Note:  This document was prepared using Dragon voice recognition software and may include unintentional dictation errors.    Nena Polio, MD 10/08/20 1539

## 2020-10-08 NOTE — ED Triage Notes (Signed)
Pt presents to ED via [place] from [facility] clinic with c/o of sudden onset chest pain and nausea. Pt states she felt fine and then all of the sudden she had a sudden onset of chest pain that radiated to her back. CP started in midsternum. Pt states HX of 'leaky valves".  Pt states she was vat [place] clinic for f/up of for injection in her back when this started. Pt is A&Ox4. NAD noted. Pt denies SOB to this RN.

## 2020-10-08 NOTE — Discharge Instructions (Addendum)
Please follow-up with Dr.Orgel, the cardiologist to [facility] clinic today.  Please return for increasing pain or shortness of breath or any other problems.  Please continue take all your medications.

## 2020-10-08 NOTE — ED Notes (Signed)
Pt to ED c/o sharp chest pain radiating to back and diaphoresis that started while in HCP office today at [facility] clinic. Pt is a&0.  Pt states CP has subsided, now 0/10. Denies SOB. Pt stated she quit smoking 48 years ago.

## 2020-10-09 ENCOUNTER — Other Ambulatory Visit: Payer: Self-pay

## 2020-10-09 ENCOUNTER — Ambulatory Visit: Payer: Medicare PPO | Attending: Pulmonary Disease

## 2020-10-09 DIAGNOSIS — J8489 Other specified interstitial pulmonary diseases: Secondary | ICD-10-CM | POA: Insufficient documentation

## 2020-10-09 MED ORDER — ALBUTEROL SULFATE (2.5 MG/3ML) 0.083% IN NEBU
2.5000 mg | INHALATION_SOLUTION | Freq: Once | RESPIRATORY_TRACT | Status: AC
  Administered 2020-10-09: 2.5 mg via RESPIRATORY_TRACT
  Filled 2020-10-09: qty 3

## 2020-10-11 ENCOUNTER — Telehealth: Payer: Self-pay | Admitting: Pulmonary Disease

## 2020-10-14 DIAGNOSIS — M25551 Pain in right hip: Secondary | ICD-10-CM | POA: Diagnosis not present

## 2020-10-14 DIAGNOSIS — M7071 Other bursitis of hip, right hip: Secondary | ICD-10-CM | POA: Diagnosis not present

## 2020-10-14 DIAGNOSIS — Z96641 Presence of right artificial hip joint: Secondary | ICD-10-CM | POA: Diagnosis not present

## 2020-10-16 IMAGING — CR DG CHEST 2V
1 series · 2 of 2 positions shown · non-contrast
Comparison: [DATE] [DATE], [DATE], [DATE] [DATE], [DATE]

CLINICAL DATA: : Cough shortness of breath.

EXAM:
CHEST - 2 VIEW

[Series 1: dg chest 2 view · 0.14mm/px · 2 of 2 slices shown]
[im 1/2]
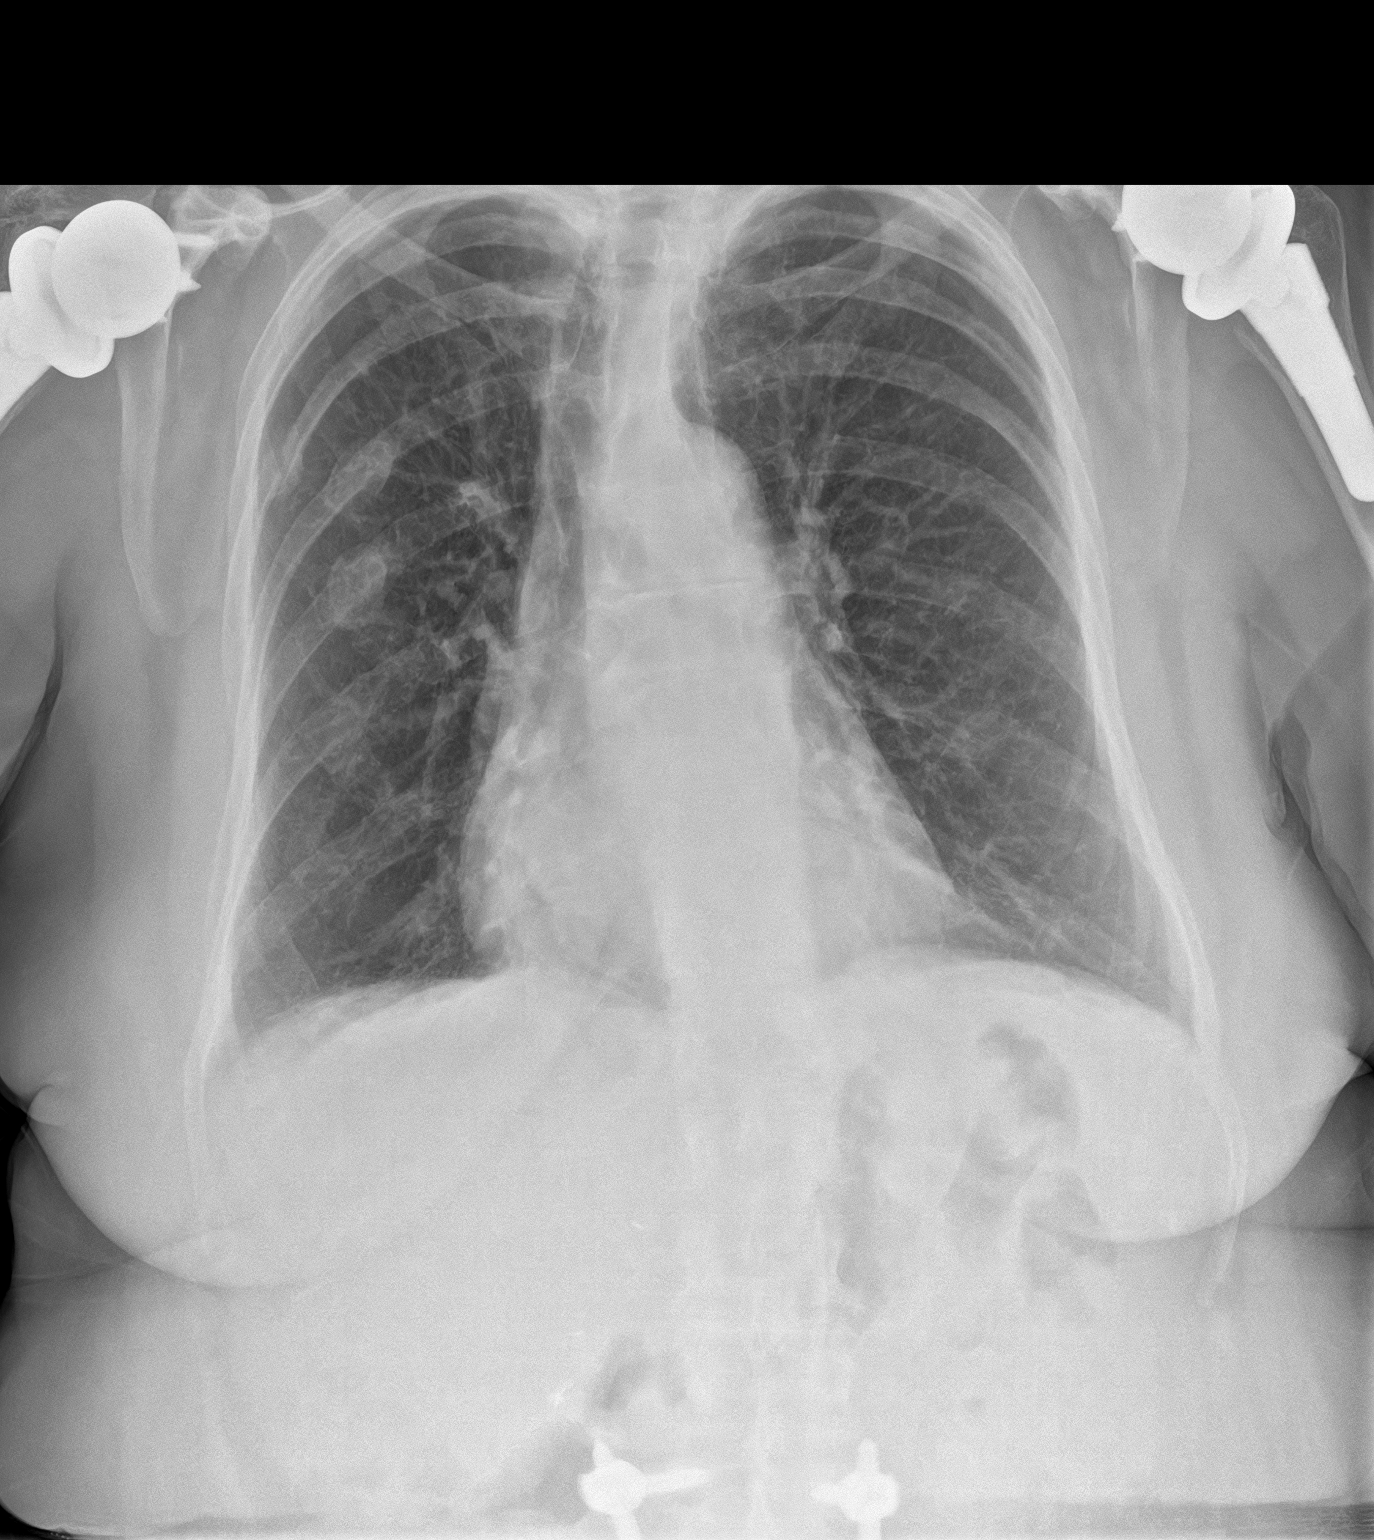
[im 2/2]
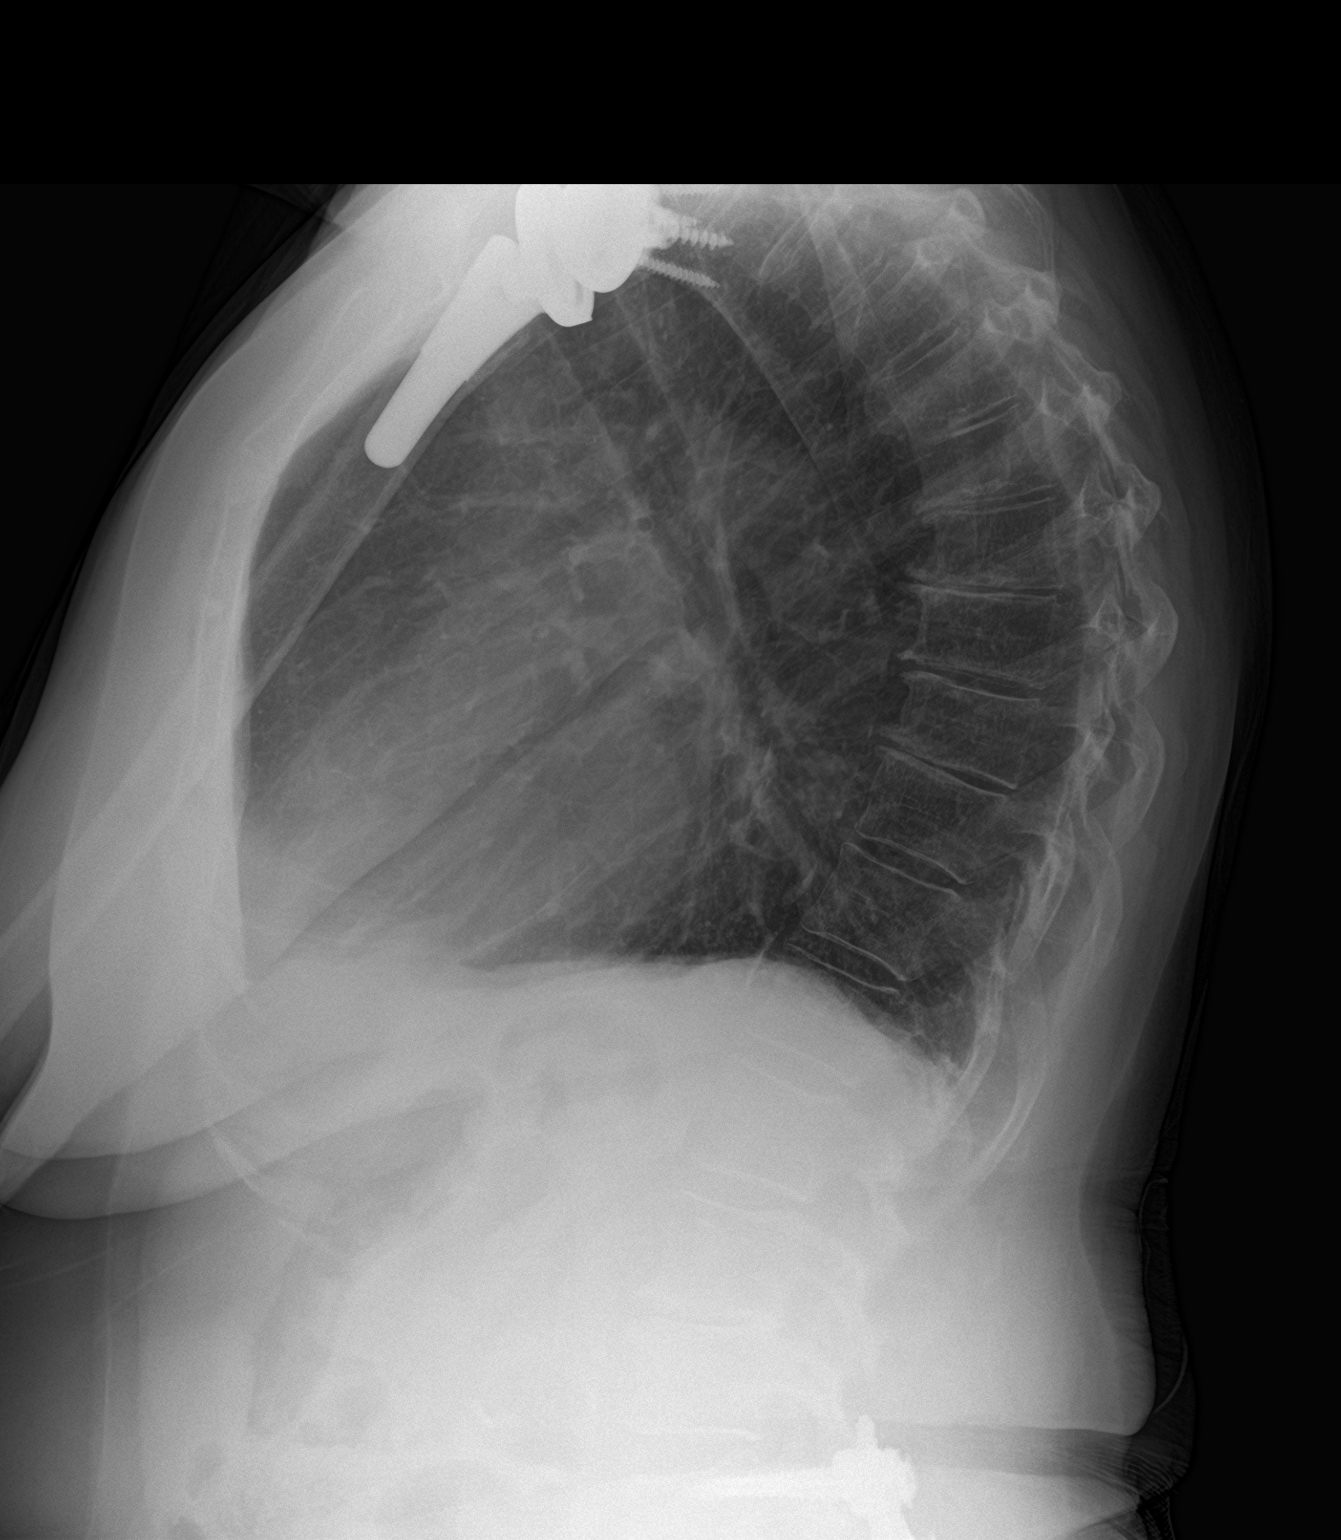

[2 of 2 positions shown; findings below may reference images not displayed]

FINDINGS: The cardiomediastinal silhouette is unchanged in contour. No pleural
effusion. No pneumothorax. No acute pleuroparenchymal abnormality.
Mild bibasilar atelectasis. Atherosclerotic calcifications. Surgical
clips project over the upper abdomen. Remote RIGHT-sided rib
fractures. Bilateral shoulder arthroplasty. Partial visualization of
lumbar spine orthopedic hardware.
IMPRESSION: No acute cardiopulmonary abnormality.

## 2020-10-17 DIAGNOSIS — F32A Depression, unspecified: Secondary | ICD-10-CM | POA: Diagnosis not present

## 2020-10-17 DIAGNOSIS — J449 Chronic obstructive pulmonary disease, unspecified: Secondary | ICD-10-CM | POA: Diagnosis not present

## 2020-10-17 DIAGNOSIS — I509 Heart failure, unspecified: Secondary | ICD-10-CM | POA: Diagnosis not present

## 2020-10-17 DIAGNOSIS — N189 Chronic kidney disease, unspecified: Secondary | ICD-10-CM | POA: Diagnosis not present

## 2020-10-17 DIAGNOSIS — D631 Anemia in chronic kidney disease: Secondary | ICD-10-CM | POA: Diagnosis not present

## 2020-10-17 DIAGNOSIS — I130 Hypertensive heart and chronic kidney disease with heart failure and stage 1 through stage 4 chronic kidney disease, or unspecified chronic kidney disease: Secondary | ICD-10-CM | POA: Diagnosis not present

## 2020-10-17 DIAGNOSIS — M069 Rheumatoid arthritis, unspecified: Secondary | ICD-10-CM | POA: Diagnosis not present

## 2020-10-17 DIAGNOSIS — I2510 Atherosclerotic heart disease of native coronary artery without angina pectoris: Secondary | ICD-10-CM | POA: Diagnosis not present

## 2020-10-17 DIAGNOSIS — E785 Hyperlipidemia, unspecified: Secondary | ICD-10-CM | POA: Diagnosis not present

## 2020-10-21 DIAGNOSIS — H353211 Exudative age-related macular degeneration, right eye, with active choroidal neovascularization: Secondary | ICD-10-CM | POA: Diagnosis not present

## 2020-10-21 DIAGNOSIS — H353122 Nonexudative age-related macular degeneration, left eye, intermediate dry stage: Secondary | ICD-10-CM | POA: Diagnosis not present

## 2020-10-21 DIAGNOSIS — D3131 Benign neoplasm of right choroid: Secondary | ICD-10-CM | POA: Diagnosis not present

## 2020-10-21 DIAGNOSIS — H43813 Vitreous degeneration, bilateral: Secondary | ICD-10-CM | POA: Diagnosis not present

## 2020-10-21 DIAGNOSIS — Z961 Presence of intraocular lens: Secondary | ICD-10-CM | POA: Diagnosis not present

## 2020-10-21 DIAGNOSIS — H04123 Dry eye syndrome of bilateral lacrimal glands: Secondary | ICD-10-CM | POA: Diagnosis not present

## 2020-10-23 DIAGNOSIS — J449 Chronic obstructive pulmonary disease, unspecified: Secondary | ICD-10-CM | POA: Diagnosis not present

## 2020-10-23 DIAGNOSIS — F32A Depression, unspecified: Secondary | ICD-10-CM | POA: Diagnosis not present

## 2020-10-23 DIAGNOSIS — E785 Hyperlipidemia, unspecified: Secondary | ICD-10-CM | POA: Diagnosis not present

## 2020-10-23 DIAGNOSIS — D631 Anemia in chronic kidney disease: Secondary | ICD-10-CM | POA: Diagnosis not present

## 2020-10-23 DIAGNOSIS — I509 Heart failure, unspecified: Secondary | ICD-10-CM | POA: Diagnosis not present

## 2020-10-23 DIAGNOSIS — M069 Rheumatoid arthritis, unspecified: Secondary | ICD-10-CM | POA: Diagnosis not present

## 2020-10-23 DIAGNOSIS — I2510 Atherosclerotic heart disease of native coronary artery without angina pectoris: Secondary | ICD-10-CM | POA: Diagnosis not present

## 2020-10-23 DIAGNOSIS — I130 Hypertensive heart and chronic kidney disease with heart failure and stage 1 through stage 4 chronic kidney disease, or unspecified chronic kidney disease: Secondary | ICD-10-CM | POA: Diagnosis not present

## 2020-10-23 DIAGNOSIS — N189 Chronic kidney disease, unspecified: Secondary | ICD-10-CM | POA: Diagnosis not present

## 2020-10-24 DIAGNOSIS — M5416 Radiculopathy, lumbar region: Secondary | ICD-10-CM | POA: Diagnosis not present

## 2020-10-25 ENCOUNTER — Other Ambulatory Visit: Payer: Self-pay | Admitting: Neurosurgery

## 2020-10-29 ENCOUNTER — Inpatient Hospital Stay
Admission: EM | Admit: 2020-10-29 | Discharge: 2020-11-04 | DRG: 191 | Disposition: A | Discharge status: Skilled Nursing Facility | Payer: Medicare PPO | Attending: Internal Medicine | Admitting: Internal Medicine

## 2020-10-29 ENCOUNTER — Other Ambulatory Visit: Payer: Self-pay

## 2020-10-29 ENCOUNTER — Emergency Department: Payer: Medicare PPO

## 2020-10-29 DIAGNOSIS — J45901 Unspecified asthma with (acute) exacerbation: Secondary | ICD-10-CM | POA: Diagnosis present

## 2020-10-29 DIAGNOSIS — I2510 Atherosclerotic heart disease of native coronary artery without angina pectoris: Secondary | ICD-10-CM | POA: Diagnosis not present

## 2020-10-29 DIAGNOSIS — E785 Hyperlipidemia, unspecified: Secondary | ICD-10-CM | POA: Diagnosis not present

## 2020-10-29 DIAGNOSIS — J440 Chronic obstructive pulmonary disease with acute lower respiratory infection: Secondary | ICD-10-CM | POA: Diagnosis present

## 2020-10-29 DIAGNOSIS — Z87891 Personal history of nicotine dependence: Secondary | ICD-10-CM

## 2020-10-29 DIAGNOSIS — I739 Peripheral vascular disease, unspecified: Secondary | ICD-10-CM | POA: Diagnosis present

## 2020-10-29 DIAGNOSIS — Z825 Family history of asthma and other chronic lower respiratory diseases: Secondary | ICD-10-CM

## 2020-10-29 DIAGNOSIS — Z818 Family history of other mental and behavioral disorders: Secondary | ICD-10-CM

## 2020-10-29 DIAGNOSIS — G8929 Other chronic pain: Secondary | ICD-10-CM | POA: Diagnosis present

## 2020-10-29 DIAGNOSIS — Z801 Family history of malignant neoplasm of trachea, bronchus and lung: Secondary | ICD-10-CM | POA: Diagnosis not present

## 2020-10-29 DIAGNOSIS — Z9981 Dependence on supplemental oxygen: Secondary | ICD-10-CM | POA: Diagnosis not present

## 2020-10-29 DIAGNOSIS — Z811 Family history of alcohol abuse and dependence: Secondary | ICD-10-CM

## 2020-10-29 DIAGNOSIS — G4730 Sleep apnea, unspecified: Secondary | ICD-10-CM | POA: Diagnosis present

## 2020-10-29 DIAGNOSIS — N1831 Chronic kidney disease, stage 3a: Secondary | ICD-10-CM | POA: Diagnosis present

## 2020-10-29 DIAGNOSIS — R0602 Shortness of breath: Secondary | ICD-10-CM | POA: Diagnosis not present

## 2020-10-29 DIAGNOSIS — Z800 Family history of malignant neoplasm of digestive organs: Secondary | ICD-10-CM | POA: Diagnosis not present

## 2020-10-29 DIAGNOSIS — Z981 Arthrodesis status: Secondary | ICD-10-CM

## 2020-10-29 DIAGNOSIS — Z881 Allergy status to other antibiotic agents status: Secondary | ICD-10-CM

## 2020-10-29 DIAGNOSIS — Z8249 Family history of ischemic heart disease and other diseases of the circulatory system: Secondary | ICD-10-CM

## 2020-10-29 DIAGNOSIS — Z96612 Presence of left artificial shoulder joint: Secondary | ICD-10-CM | POA: Diagnosis present

## 2020-10-29 DIAGNOSIS — K219 Gastro-esophageal reflux disease without esophagitis: Secondary | ICD-10-CM | POA: Diagnosis not present

## 2020-10-29 DIAGNOSIS — S2231XA Fracture of one rib, right side, initial encounter for closed fracture: Secondary | ICD-10-CM | POA: Diagnosis not present

## 2020-10-29 DIAGNOSIS — R279 Unspecified lack of coordination: Secondary | ICD-10-CM | POA: Diagnosis not present

## 2020-10-29 DIAGNOSIS — Z882 Allergy status to sulfonamides status: Secondary | ICD-10-CM

## 2020-10-29 DIAGNOSIS — N189 Chronic kidney disease, unspecified: Secondary | ICD-10-CM | POA: Diagnosis not present

## 2020-10-29 DIAGNOSIS — F419 Anxiety disorder, unspecified: Secondary | ICD-10-CM | POA: Diagnosis not present

## 2020-10-29 DIAGNOSIS — J45998 Other asthma: Secondary | ICD-10-CM | POA: Diagnosis not present

## 2020-10-29 DIAGNOSIS — Z887 Allergy status to serum and vaccine status: Secondary | ICD-10-CM

## 2020-10-29 DIAGNOSIS — Z807 Family history of other malignant neoplasms of lymphoid, hematopoietic and related tissues: Secondary | ICD-10-CM

## 2020-10-29 DIAGNOSIS — R0600 Dyspnea, unspecified: Secondary | ICD-10-CM | POA: Diagnosis not present

## 2020-10-29 DIAGNOSIS — Z888 Allergy status to other drugs, medicaments and biological substances status: Secondary | ICD-10-CM

## 2020-10-29 DIAGNOSIS — G4700 Insomnia, unspecified: Secondary | ICD-10-CM

## 2020-10-29 DIAGNOSIS — D631 Anemia in chronic kidney disease: Secondary | ICD-10-CM | POA: Diagnosis not present

## 2020-10-29 DIAGNOSIS — Z7951 Long term (current) use of inhaled steroids: Secondary | ICD-10-CM

## 2020-10-29 DIAGNOSIS — J449 Chronic obstructive pulmonary disease, unspecified: Secondary | ICD-10-CM | POA: Diagnosis not present

## 2020-10-29 DIAGNOSIS — Z823 Family history of stroke: Secondary | ICD-10-CM

## 2020-10-29 DIAGNOSIS — M25552 Pain in left hip: Secondary | ICD-10-CM | POA: Diagnosis not present

## 2020-10-29 DIAGNOSIS — Z833 Family history of diabetes mellitus: Secondary | ICD-10-CM

## 2020-10-29 DIAGNOSIS — I499 Cardiac arrhythmia, unspecified: Secondary | ICD-10-CM | POA: Diagnosis not present

## 2020-10-29 DIAGNOSIS — I129 Hypertensive chronic kidney disease with stage 1 through stage 4 chronic kidney disease, or unspecified chronic kidney disease: Secondary | ICD-10-CM | POA: Diagnosis present

## 2020-10-29 DIAGNOSIS — R069 Unspecified abnormalities of breathing: Secondary | ICD-10-CM | POA: Diagnosis not present

## 2020-10-29 DIAGNOSIS — J9611 Chronic respiratory failure with hypoxia: Secondary | ICD-10-CM | POA: Diagnosis present

## 2020-10-29 DIAGNOSIS — Z803 Family history of malignant neoplasm of breast: Secondary | ICD-10-CM | POA: Diagnosis not present

## 2020-10-29 DIAGNOSIS — Z96651 Presence of right artificial knee joint: Secondary | ICD-10-CM | POA: Diagnosis present

## 2020-10-29 DIAGNOSIS — Z20822 Contact with and (suspected) exposure to covid-19: Secondary | ICD-10-CM | POA: Diagnosis not present

## 2020-10-29 DIAGNOSIS — I10 Essential (primary) hypertension: Secondary | ICD-10-CM | POA: Diagnosis not present

## 2020-10-29 DIAGNOSIS — Z91041 Radiographic dye allergy status: Secondary | ICD-10-CM

## 2020-10-29 DIAGNOSIS — M1990 Unspecified osteoarthritis, unspecified site: Secondary | ICD-10-CM | POA: Diagnosis present

## 2020-10-29 DIAGNOSIS — M6281 Muscle weakness (generalized): Secondary | ICD-10-CM | POA: Diagnosis not present

## 2020-10-29 DIAGNOSIS — I130 Hypertensive heart and chronic kidney disease with heart failure and stage 1 through stage 4 chronic kidney disease, or unspecified chronic kidney disease: Secondary | ICD-10-CM | POA: Diagnosis not present

## 2020-10-29 DIAGNOSIS — M069 Rheumatoid arthritis, unspecified: Secondary | ICD-10-CM | POA: Diagnosis present

## 2020-10-29 DIAGNOSIS — R0689 Other abnormalities of breathing: Secondary | ICD-10-CM | POA: Diagnosis not present

## 2020-10-29 DIAGNOSIS — R6889 Other general symptoms and signs: Secondary | ICD-10-CM | POA: Diagnosis not present

## 2020-10-29 DIAGNOSIS — M0579 Rheumatoid arthritis with rheumatoid factor of multiple sites without organ or systems involvement: Secondary | ICD-10-CM | POA: Diagnosis not present

## 2020-10-29 DIAGNOSIS — Z7952 Long term (current) use of systemic steroids: Secondary | ICD-10-CM | POA: Diagnosis not present

## 2020-10-29 DIAGNOSIS — F3160 Bipolar disorder, current episode mixed, unspecified: Secondary | ICD-10-CM | POA: Diagnosis not present

## 2020-10-29 DIAGNOSIS — I509 Heart failure, unspecified: Secondary | ICD-10-CM | POA: Diagnosis not present

## 2020-10-29 DIAGNOSIS — Z820 Family history of epilepsy and other diseases of the nervous system: Secondary | ICD-10-CM

## 2020-10-29 DIAGNOSIS — J209 Acute bronchitis, unspecified: Secondary | ICD-10-CM | POA: Diagnosis not present

## 2020-10-29 DIAGNOSIS — M5430 Sciatica, unspecified side: Secondary | ICD-10-CM | POA: Diagnosis present

## 2020-10-29 DIAGNOSIS — Z79899 Other long term (current) drug therapy: Secondary | ICD-10-CM

## 2020-10-29 DIAGNOSIS — Z96611 Presence of right artificial shoulder joint: Secondary | ICD-10-CM | POA: Diagnosis present

## 2020-10-29 DIAGNOSIS — Z96643 Presence of artificial hip joint, bilateral: Secondary | ICD-10-CM | POA: Diagnosis present

## 2020-10-29 DIAGNOSIS — Z743 Need for continuous supervision: Secondary | ICD-10-CM | POA: Diagnosis not present

## 2020-10-29 DIAGNOSIS — J441 Chronic obstructive pulmonary disease with (acute) exacerbation: Secondary | ICD-10-CM | POA: Diagnosis not present

## 2020-10-29 DIAGNOSIS — R5381 Other malaise: Secondary | ICD-10-CM | POA: Diagnosis not present

## 2020-10-29 DIAGNOSIS — R600 Localized edema: Secondary | ICD-10-CM | POA: Diagnosis not present

## 2020-10-29 DIAGNOSIS — F32A Depression, unspecified: Secondary | ICD-10-CM | POA: Diagnosis not present

## 2020-10-29 DIAGNOSIS — D649 Anemia, unspecified: Secondary | ICD-10-CM | POA: Diagnosis present

## 2020-10-29 DIAGNOSIS — J849 Interstitial pulmonary disease, unspecified: Secondary | ICD-10-CM | POA: Diagnosis not present

## 2020-10-29 DIAGNOSIS — Z886 Allergy status to analgesic agent status: Secondary | ICD-10-CM

## 2020-10-29 DIAGNOSIS — Z8261 Family history of arthritis: Secondary | ICD-10-CM

## 2020-10-29 DIAGNOSIS — J439 Emphysema, unspecified: Secondary | ICD-10-CM | POA: Diagnosis not present

## 2020-10-29 DIAGNOSIS — J90 Pleural effusion, not elsewhere classified: Secondary | ICD-10-CM | POA: Diagnosis not present

## 2020-10-29 LAB — CBC WITH DIFFERENTIAL/PLATELET
Abs Immature Granulocytes: 0.15 10*3/uL — ABNORMAL HIGH (ref 0.00–0.07)
Basophils Absolute: 0.1 10*3/uL (ref 0.0–0.1)
Basophils Relative: 1 %
Eosinophils Absolute: 0.6 10*3/uL — ABNORMAL HIGH (ref 0.0–0.5)
Eosinophils Relative: 5 %
HCT: 37.1 % (ref 36.0–46.0)
Hemoglobin: 11.8 g/dL — ABNORMAL LOW (ref 12.0–15.0)
Immature Granulocytes: 1 %
Lymphocytes Relative: 22 %
Lymphs Abs: 2.7 10*3/uL (ref 0.7–4.0)
MCH: 28.9 pg (ref 26.0–34.0)
MCHC: 31.8 g/dL (ref 30.0–36.0)
MCV: 90.9 fL (ref 80.0–100.0)
Monocytes Absolute: 1 10*3/uL (ref 0.1–1.0)
Monocytes Relative: 8 %
Neutro Abs: 7.7 10*3/uL (ref 1.7–7.7)
Neutrophils Relative %: 63 %
Platelets: 222 10*3/uL (ref 150–400)
RBC: 4.08 MIL/uL (ref 3.87–5.11)
RDW: 14.6 % (ref 11.5–15.5)
WBC: 12.2 10*3/uL — ABNORMAL HIGH (ref 4.0–10.5)
nRBC: 0 % (ref 0.0–0.2)

## 2020-10-29 LAB — COMPREHENSIVE METABOLIC PANEL
ALT: 16 U/L (ref 0–44)
AST: 20 U/L (ref 15–41)
Albumin: 3.4 g/dL — ABNORMAL LOW (ref 3.5–5.0)
Alkaline Phosphatase: 77 U/L (ref 38–126)
Anion gap: 11 (ref 5–15)
BUN: 15 mg/dL (ref 8–23)
CO2: 26 mmol/L (ref 22–32)
Calcium: 8.4 mg/dL — ABNORMAL LOW (ref 8.9–10.3)
Chloride: 102 mmol/L (ref 98–111)
Creatinine, Ser: 1.24 mg/dL — ABNORMAL HIGH (ref 0.44–1.00)
GFR, Estimated: 45 mL/min — ABNORMAL LOW (ref >60)
Glucose, Bld: 146 mg/dL — ABNORMAL HIGH (ref 70–99)
Potassium: 4.5 mmol/L (ref 3.5–5.1)
Sodium: 139 mmol/L (ref 135–145)
Total Bilirubin: 0.6 mg/dL (ref 0.3–1.2)
Total Protein: 6.7 g/dL (ref 6.5–8.1)

## 2020-10-29 LAB — RESP PANEL BY RT-PCR (FLU A&B, COVID) ARPGX2
Influenza A by PCR: NEGATIVE
Influenza B by PCR: NEGATIVE
SARS Coronavirus 2 by RT PCR: NEGATIVE

## 2020-10-29 LAB — BRAIN NATRIURETIC PEPTIDE: B Natriuretic Peptide: 209.3 pg/mL — ABNORMAL HIGH (ref 0.0–100.0)

## 2020-10-29 LAB — TROPONIN I (HIGH SENSITIVITY): Troponin I (High Sensitivity): 11 ng/L (ref <18)

## 2020-10-29 MED ORDER — ACETAMINOPHEN 325 MG PO TABS: 650.0000 mg | ORAL_TABLET | Freq: Four times a day (QID) | ORAL | Status: DC | PRN

## 2020-10-29 MED ORDER — DICYCLOMINE HCL 10 MG PO CAPS
10.0000 mg | ORAL_CAPSULE | Freq: Two times a day (BID) | ORAL | Status: DC | PRN
  Filled 2020-10-29 (×2): qty 1

## 2020-10-29 MED ORDER — LEFLUNOMIDE 20 MG PO TABS
20.0000 mg | ORAL_TABLET | Freq: Every day | ORAL | Status: DC
  Administered 2020-10-30 – 2020-11-04 (×6): 20 mg via ORAL
  Filled 2020-10-29 (×6): qty 1

## 2020-10-29 MED ORDER — ACETAMINOPHEN 650 MG RE SUPP: 650.0000 mg | Freq: Four times a day (QID) | RECTAL | Status: DC | PRN

## 2020-10-29 MED ORDER — MONTELUKAST SODIUM 10 MG PO TABS
10.0000 mg | ORAL_TABLET | Freq: Every day | ORAL | Status: DC
  Administered 2020-10-29 – 2020-11-04 (×7): 10 mg via ORAL
  Filled 2020-10-29 (×7): qty 1

## 2020-10-29 MED ORDER — TRAMADOL HCL 50 MG PO TABS: 50.0000 mg | ORAL_TABLET | ORAL | Status: DC

## 2020-10-29 MED ORDER — ONDANSETRON HCL 4 MG/2ML IJ SOLN: 4.0000 mg | Freq: Four times a day (QID) | INTRAMUSCULAR | Status: DC | PRN

## 2020-10-29 MED ORDER — FLUTICASONE PROPIONATE 50 MCG/ACT NA SUSP
2.0000 | Freq: Every day | NASAL | Status: DC
  Administered 2020-10-30 – 2020-11-04 (×6): 2 via NASAL
  Filled 2020-10-29 (×3): qty 16

## 2020-10-29 MED ORDER — METHYLPREDNISOLONE SODIUM SUCC 125 MG IJ SOLR
125.0000 mg | INTRAMUSCULAR | Status: AC
  Administered 2020-10-29: 125 mg via INTRAVENOUS
  Filled 2020-10-29: qty 2

## 2020-10-29 MED ORDER — ALPRAZOLAM 0.25 MG PO TABS
0.2500 mg | ORAL_TABLET | Freq: Every evening | ORAL | Status: DC | PRN
  Administered 2020-10-30 – 2020-11-03 (×4): 0.25 mg via ORAL
  Filled 2020-10-29 (×4): qty 1

## 2020-10-29 MED ORDER — ONDANSETRON HCL 4 MG PO TABS: 4.0000 mg | ORAL_TABLET | Freq: Four times a day (QID) | ORAL | Status: DC | PRN

## 2020-10-29 MED ORDER — BUDESONIDE 0.25 MG/2ML IN SUSP
0.2500 mg | Freq: Two times a day (BID) | RESPIRATORY_TRACT | Status: DC
  Administered 2020-10-29 – 2020-11-04 (×11): 0.25 mg via RESPIRATORY_TRACT
  Filled 2020-10-29 (×12): qty 2

## 2020-10-29 MED ORDER — HYDROCODONE-ACETAMINOPHEN 5-325 MG PO TABS
1.0000 | ORAL_TABLET | Freq: Once | ORAL | Status: AC
  Administered 2020-10-29: 1 via ORAL
  Filled 2020-10-29: qty 1

## 2020-10-29 MED ORDER — FAMOTIDINE 20 MG PO TABS
20.0000 mg | ORAL_TABLET | Freq: Every day | ORAL | Status: DC
  Administered 2020-10-29 – 2020-11-04 (×7): 20 mg via ORAL
  Filled 2020-10-29 (×7): qty 1

## 2020-10-29 MED ORDER — IPRATROPIUM-ALBUTEROL 0.5-2.5 (3) MG/3ML IN SOLN
3.0000 mL | Freq: Once | RESPIRATORY_TRACT | Status: AC
  Administered 2020-10-29: 3 mL via RESPIRATORY_TRACT

## 2020-10-29 MED ORDER — ENOXAPARIN SODIUM 40 MG/0.4ML IJ SOSY
40.0000 mg | PREFILLED_SYRINGE | INTRAMUSCULAR | Status: DC
  Filled 2020-10-29: qty 0.4

## 2020-10-29 MED ORDER — AMOXICILLIN-POT CLAVULANATE 875-125 MG PO TABS
1.0000 | ORAL_TABLET | Freq: Once | ORAL | Status: AC
  Administered 2020-10-29: 1 via ORAL
  Filled 2020-10-29: qty 1

## 2020-10-29 MED ORDER — IPRATROPIUM-ALBUTEROL 0.5-2.5 (3) MG/3ML IN SOLN
3.0000 mL | Freq: Once | RESPIRATORY_TRACT | Status: AC
  Administered 2020-10-29: 3 mL via RESPIRATORY_TRACT
  Filled 2020-10-29: qty 6

## 2020-10-29 MED ORDER — IPRATROPIUM-ALBUTEROL 0.5-2.5 (3) MG/3ML IN SOLN
3.0000 mL | Freq: Four times a day (QID) | RESPIRATORY_TRACT | Status: DC
  Administered 2020-10-29 – 2020-10-31 (×6): 3 mL via RESPIRATORY_TRACT
  Filled 2020-10-29 (×6): qty 3

## 2020-10-29 MED ORDER — SODIUM CHLORIDE 0.9 % IV SOLN: INTRAVENOUS | Status: DC

## 2020-10-29 MED ORDER — ESCITALOPRAM OXALATE 10 MG PO TABS
10.0000 mg | ORAL_TABLET | Freq: Every day | ORAL | Status: DC
  Administered 2020-10-29 – 2020-11-04 (×7): 10 mg via ORAL
  Filled 2020-10-29 (×7): qty 1

## 2020-10-29 MED ORDER — TRAZODONE HCL 50 MG PO TABS
25.0000 mg | ORAL_TABLET | Freq: Every evening | ORAL | Status: DC | PRN
  Administered 2020-10-30 – 2020-11-03 (×6): 25 mg via ORAL
  Filled 2020-10-29 (×5): qty 1

## 2020-10-29 MED ORDER — ALBUTEROL SULFATE (2.5 MG/3ML) 0.083% IN NEBU
2.5000 mg | INHALATION_SOLUTION | RESPIRATORY_TRACT | Status: AC
  Administered 2020-10-29: 2.5 mg via RESPIRATORY_TRACT
  Filled 2020-10-29: qty 3

## 2020-10-29 MED ORDER — PRAVASTATIN SODIUM 20 MG PO TABS
20.0000 mg | ORAL_TABLET | Freq: Every day | ORAL | Status: DC
Start: 2020-10-29 — End: 2020-11-04
  Administered 2020-10-29 – 2020-11-03 (×6): 20 mg via ORAL
  Filled 2020-10-29 (×6): qty 1

## 2020-10-29 MED ORDER — PREDNISONE 20 MG PO TABS
40.0000 mg | ORAL_TABLET | Freq: Every day | ORAL | Status: DC
Start: 2020-10-31 — End: 2020-10-30

## 2020-10-29 MED ORDER — MAGNESIUM HYDROXIDE 400 MG/5ML PO SUSP: 30.0000 mL | Freq: Every day | ORAL | Status: DC | PRN

## 2020-10-29 MED ORDER — METHYLPREDNISOLONE SODIUM SUCC 125 MG IJ SOLR
60.0000 mg | Freq: Two times a day (BID) | INTRAMUSCULAR | Status: DC
  Administered 2020-10-29: 60 mg via INTRAVENOUS
  Filled 2020-10-29 (×2): qty 2

## 2020-10-29 MED ORDER — IPRATROPIUM-ALBUTEROL 0.5-2.5 (3) MG/3ML IN SOLN
3.0000 mL | Freq: Once | RESPIRATORY_TRACT | Status: AC
  Administered 2020-10-29: 3 mL via RESPIRATORY_TRACT
  Filled 2020-10-29: qty 3

## 2020-10-29 MED ORDER — AMLODIPINE BESYLATE 5 MG PO TABS
2.5000 mg | ORAL_TABLET | Freq: Every day | ORAL | Status: DC
  Administered 2020-10-29 – 2020-11-04 (×7): 2.5 mg via ORAL
  Filled 2020-10-29 (×7): qty 1

## 2020-10-29 MED ORDER — CYCLOBENZAPRINE HCL 10 MG PO TABS
10.0000 mg | ORAL_TABLET | Freq: Three times a day (TID) | ORAL | Status: DC | PRN
  Administered 2020-10-30 – 2020-11-01 (×4): 10 mg via ORAL
  Filled 2020-10-29 (×4): qty 1

## 2020-10-29 MED ORDER — QUETIAPINE FUMARATE 25 MG PO TABS
25.0000 mg | ORAL_TABLET | Freq: Every day | ORAL | Status: DC
  Administered 2020-10-29 – 2020-11-03 (×6): 25 mg via ORAL
  Filled 2020-10-29 (×6): qty 1

## 2020-10-29 MED ORDER — MIRABEGRON ER 50 MG PO TB24
50.0000 mg | ORAL_TABLET | Freq: Every day | ORAL | Status: DC
  Administered 2020-10-30 – 2020-11-02 (×4): 50 mg via ORAL
  Filled 2020-10-29 (×6): qty 1

## 2020-10-29 MED ORDER — FENTANYL CITRATE PF 50 MCG/ML IJ SOSY
12.5000 ug | PREFILLED_SYRINGE | INTRAMUSCULAR | Status: DC | PRN
  Administered 2020-10-30 (×3): 12.5 ug via INTRAVENOUS
  Filled 2020-10-29 (×2): qty 1
  Filled 2020-10-29: qty 0.25
  Filled 2020-10-29: qty 1

## 2020-10-29 MED ORDER — LAMOTRIGINE 25 MG PO TABS
100.0000 mg | ORAL_TABLET | Freq: Two times a day (BID) | ORAL | Status: DC
  Administered 2020-10-29 – 2020-11-04 (×12): 100 mg via ORAL
  Filled 2020-10-29 (×2): qty 4
  Filled 2020-10-29: qty 1
  Filled 2020-10-29 (×7): qty 4
  Filled 2020-10-29 (×2): qty 1

## 2020-10-29 MED ORDER — SUCRALFATE 1 G PO TABS
1.0000 g | ORAL_TABLET | Freq: Two times a day (BID) | ORAL | Status: DC
  Administered 2020-10-29 – 2020-11-04 (×12): 1 g via ORAL
  Filled 2020-10-29 (×12): qty 1

## 2020-10-29 MED ORDER — GABAPENTIN 300 MG PO CAPS
600.0000 mg | ORAL_CAPSULE | Freq: Two times a day (BID) | ORAL | Status: DC
Start: 2020-10-29 — End: 2020-11-04
  Administered 2020-10-29 – 2020-11-04 (×12): 600 mg via ORAL
  Filled 2020-10-29 (×12): qty 2

## 2020-10-29 MED ORDER — PANTOPRAZOLE SODIUM 40 MG PO TBEC
40.0000 mg | DELAYED_RELEASE_TABLET | Freq: Every day | ORAL | Status: DC
  Administered 2020-10-29 – 2020-11-04 (×7): 40 mg via ORAL
  Filled 2020-10-29 (×7): qty 1

## 2020-10-29 NOTE — ED Provider Notes (Signed)
Emergency Medicine Provider Triage Evaluation Note  JAMIEE MILHOLLAND , a 80 y.o. female  was evaluated in triage.  Pt complains of difficulty breathing.  Wears 4 L nasal cannula at home.  Review of Systems  Positive: Difficulty breathing,  Negative: No fever, chills, chest pain, abdominal pain  Physical Exam  BP 140/62   Pulse 83   Temp 98.4 F (36.9 C) (Oral)   Resp 20   Ht 5\' 1"  (1.549 m)   Wt 83 kg   SpO2 97%   BMI 34.58 kg/m  Gen:   Awake, no distress   Resp:  Increased, diffuse wheezing MSK:   Moves extremities without difficulty  Other:    Medical Decision Making  Medically screening exam initiated at 1:19 PM.  Appropriate orders placed.  CAROLYNA YERIAN was informed that the remainder of the evaluation will be completed by another provider, this initial triage assessment does not replace that evaluation, and the importance of remaining in the ED until their evaluation is complete.  DuoNeb ordered, labs and imaging   Weldon Inches 10/29/20 [place]    Blake Divine, MD 10/29/20 1536

## 2020-10-29 NOTE — Telephone Encounter (Signed)
Noted.  Will close encounter.  

## 2020-10-29 NOTE — ED Triage Notes (Signed)
PT here with SOB that started today and left thigh pain that started a week ago. Pt is set to have back surgery in a week for a pinched nerve. PT wears 4L.

## 2020-10-29 NOTE — ED Provider Notes (Signed)
[facility] Emergency Department Provider Note   ____________________________________________   Event Date/Time   First MD Initiated Contact with Patient 10/29/20 1840     (approximate)  I have reviewed the triage vital signs and the nursing notes.   HISTORY  Chief Complaint Shortness of Breath    HPI Kerri Carter is a 80 y.o. female with a history of asthma, CKD, chronic pneumonitis, COPD and recent left lower leg and sciatic pain with plan for surgery in about 1 week with Dr. Cari Caraway regarding left lower leg pain  Patient reports that she has been having left lower leg pain for quite a notable amount of time and both she and her son are here affirmed that it is not the reason she came to the ER.  She came today because she is been having shortness of breath since this morning.  She woke up and has had a bit of a cough wheezing and shortness of breath.  She uses oxygen in the evening and "as needed" at home but has felt that use of rescue inhalers has not been helping.  She is also on daily prednisone  She is not a fever but has had a bit of a cough notable wheezing and increased shortness of breath throughout the day.  No leg swelling.  No chest pain.  Reports ongoing pain which is a lancinating pain from her left lower buttock down into the level of the left leg which has been ongoing now for some time and does not seem to be progressing   Past Medical History:  Diagnosis Date   Anxiety    Asthma    Bipolar disorder ([place])    C. difficile diarrhea    from Abx 2010 hospitalized    Cataract    s/p b/l repair    Chicken pox    CKD (chronic kidney disease)    CKD (chronic kidney disease), stage III ([place])    a. s/p R nephrectomy./ aneurysm   Conversion disorder    COPD (chronic obstructive pulmonary disease) ([place])    Depression    Essential hypertension    GERD (gastroesophageal reflux disease)    Hyperlipidemia    ILD (interstitial lung  disease) ([place])    mild; 2/2 RA diagnosis   Inflammatory arthritis    a. hands/carpal tunnel.  b. Low titer rheumatoid factor. c. Negative anti-CCP antibodies. d. Plaquenil.   Macular degeneration    Nocturnal hypoxemia    Non-Obstructive CAD    a. 07/2009 Cath ([facility]): nonobs dzs;  b. 03/2011 Cath [facility]): nonobs dzs.   Osteoarthritis    a. Knees.   PAD (peripheral artery disease) (H[facility])    PUD (peptic ulcer disease)    S/P right hip fracture    11/01/16 s/p repair   Shoulder pain    Sleep apnea    no cpap / minimal   Spinal stenosis at L4-L5 level    severe with L4/L5 anterolisthesis grade 1 anterolisthesis    Toxic maculopathy    Valvular heart disease    a. 07/2015 Echo: EF 55-60%, Mild AI, AS, MR, and TR.    Patient Active Problem List   Diagnosis Date Noted   COPD exacerbation ([place]) 10/29/2020   Acromial process of scapula fracture 12/21/2019   NSIP (nonspecific interstitial pneumonitis) ([place]) 12/08/2019   Iliopsoas bursitis of right hip 08/29/2019   Chronic left shoulder pain 08/29/2019   Overweight (BMI 25.0-29.9) 08/29/2019   Hypertriglyceridemia 07/24/2019   Bipolar disorder, in  full remission, most recent episode depressed ([place]) 07/13/2019   Essential hypertension 06/16/2019   Chronic pain 06/16/2019   Lymphedema 06/05/2019   Chronic venous insufficiency 05/25/2019   PAD (peripheral artery disease) ([place]) 05/25/2019   Leg edema 05/02/2019   Episodic mood disorder ([place]) 35/36/1443   Complicated grief 15/40/0867   At high risk for falls 03/16/2019   Pelvic mass in female 03/16/2019   Compression fracture of L4 vertebra ([place]) 02/21/2019   Collapsed vertebra, not elsewhere classified, lumbar region, initial encounter for fracture ([place]) 02/21/2019   Anxiety 01/20/2019   Pain due to onychomycosis of toenails of both feet 12/05/2018   Hav (hallux abducto valgus), unspecified laterality 12/05/2018   Closed fracture of acromial process of scapula 10/28/2018   Status post  reverse arthroplasty of shoulder, left 07/26/2018   Interstitial pulmonary disease ([place]) 07/07/2018   Fatigue 07/07/2018   Avascular necrosis of left shoulder due to adverse effect of steroid therapy ([place]) 01/20/2018   Other shoulder lesions, left shoulder 12/20/2017   Status post reverse total shoulder replacement, right 11/04/2017   Leukocytosis 10/19/2017   Allergic rhinitis 09/28/2017   Rotator cuff tendinitis, right 07/26/2017   Strain of right hip 02/26/2017   History of fracture of left hip 01/15/2017   Status post lumbar spinal fusion 01/15/2017   Dislocation of hip joint prosthesis ([place]) 01/08/2017   Status post hip hemiarthroplasty 01/08/2017   Leg swelling 12/15/2016   Age-related osteoporosis without current pathological fracture 11/05/2016   Hip fracture ([place]) 10/31/2016   Polyneuropathy 10/28/2016   Left foot pain 10/19/2016   Spinal stenosis of lumbar region 09/15/2016   Osteoporosis 08/27/2016   Drug-induced osteoporosis 08/27/2016   Adnexal mass 08/25/2016   Prediabetes 08/25/2016   Coronary atherosclerosis 08/25/2016   History of syncope 08/25/2016   Hypokalemia 08/25/2016   Mixed bipolar I disorder ([place]) 10/09/2015   Stage 3b chronic kidney disease ([place])    Conversion disorder 08/04/2015   Hyperlipidemia 07/17/2015   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   Macular degeneration 07/17/2015   Insomnia 07/17/2015   Rheumatoid arthritis ([place]) 12/05/2014   Other specified postprocedural states 07/20/2013   Anxiety and depression 06/13/2013   Osteoarthritis of knee 06/07/2013   Cough 05/02/2012   Valvular heart disease 10/13/2011   Asthma-COPD overlap syndrome ([place]) 09/09/2011   OSA (obstructive sleep apnea) 09/09/2011   Nocturnal hypoxemia 09/09/2011   Gastroesophageal reflux disease 02/24/2011   Single kidney 02/24/2011   H/O cardiac catheterization 02/24/2011   Renal artery stenosis ([place]) 02/24/2011    Past Surgical History:   Procedure Laterality Date   APPENDECTOMY     BACK SURGERY     lumbar fusion   BUNIONECTOMY Right    CATARACT EXTRACTION, BILATERAL     CESAREAN SECTION     x1   CHOLECYSTECTOMY N/A 05/11/2016   Procedure: LAPAROSCOPIC CHOLECYSTECTOMY;  Surgeon: Florene Glen, MD;  Location: [facility] ORS;  Service: General;  Laterality: N/A;   COLONOSCOPY WITH PROPOFOL N/A 04/02/2016   Procedure: COLONOSCOPY WITH PROPOFOL;  Surgeon: Jonathon Bellows, MD;  Location: [facility] ENDOSCOPY;  Service: Endoscopy;  Laterality: N/A;   ENDOSCOPIC RETROGRADE CHOLANGIOPANCREATOGRAPHY (ERCP) WITH PROPOFOL N/A 05/08/2016   Procedure: ENDOSCOPIC RETROGRADE CHOLANGIOPANCREATOGRAPHY (ERCP) WITH PROPOFOL;  Surgeon: Lucilla Lame, MD;  Location: [facility] ENDOSCOPY;  Service: Endoscopy;  Laterality: N/A;   ERCP     with biliary spincterotomy 05/08/16 Dr. Allen Norris for choledocholithiasis    ESOPHAGEAL DILATION  04/02/2016   Procedure: ESOPHAGEAL DILATION;  Surgeon: Jonathon Bellows, MD;  Location: [facility] ENDOSCOPY;  Service: Endoscopy;;   ESOPHAGOGASTRODUODENOSCOPY (EGD) WITH PROPOFOL N/A 04/02/2016   Procedure: ESOPHAGOGASTRODUODENOSCOPY (EGD) WITH PROPOFOL;  Surgeon: Jonathon Bellows, MD;  Location: [facility] ENDOSCOPY;  Service: Endoscopy;  Laterality: N/A;   HIP ARTHROPLASTY Right 11/01/2016   Procedure: ARTHROPLASTY BIPOLAR HIP (HEMIARTHROPLASTY);  Surgeon: Corky Mull, MD;  Location: [facility] ORS;  Service: Orthopedics;  Laterality: Right;   NEPHRECTOMY  1988   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   ORIF SCAPHOID FRACTURE Left 12/21/2019   Procedure: OPEN REDUCTION INTERNAL FIXATION (ORIF) OF LEFT SCAPULAR NONUNION WITH BONE GRAFT;  Surgeon: Corky Mull, MD;  Location: [facility] ORS;  Service: Orthopedics;  Laterality: Left;   osteoporosis     noted DEXA 08/19/16    REPLACEMENT TOTAL KNEE Right    REVERSE SHOULDER ARTHROPLASTY Right 11/04/2017   Procedure: REVERSE SHOULDER ARTHROPLASTY;  Surgeon: Corky Mull, MD;  Location: [facility] ORS;  Service:  Orthopedics;  Laterality: Right;   REVERSE SHOULDER ARTHROPLASTY Left 07/26/2018   Procedure: REVERSE SHOULDER ARTHROPLASTY;  Surgeon: Corky Mull, MD;  Location: [facility] ORS;  Service: Orthopedics;  Laterality: Left;   TONSILLECTOMY     TOTAL HIP ARTHROPLASTY  12/10/11   [facility] left hip   TOTAL HIP ARTHROPLASTY Bilateral    TUBAL LIGATION      Prior to Admission medications   Medication Sig Start Date End Date Taking? Authorizing Provider  Adalimumab 40 MG/0.4ML PNKT Inject 40 mg into the skin every 14 (fourteen) days.    Yes [provider]  albuterol (VENTOLIN HFA) 108 (90 Base) MCG/ACT inhaler Inhale 2 puffs into the lungs every 6 (six) hours as needed for wheezing or shortness of breath. 09/03/20  Yes McLean-Scocuzza, Nino Glow, MD  ALPRAZolam (XANAX) 0.25 MG tablet TAKE 1 TABLET BY MOUTH AT BEDTIME AS NEEDED FOR ANXIETY. 06/20/20  Yes McLean-Scocuzza, Nino Glow, MD  amLODipine (NORVASC) 2.5 MG tablet Take 1 tablet (2.5 mg total) by mouth daily. 09/03/20  Yes McLean-Scocuzza, Nino Glow, MD  budesonide (PULMICORT) 0.25 MG/2ML nebulizer solution USE 1 VIAL  IN  NEBULIZER TWICE  DAILY - rinse mouth after treatment 12/01/19  Yes [name], MD  dicyclomine (BENTYL) 10 MG capsule Take 1 capsule (10 mg total) by mouth 2 (two) times daily as needed for spasms. 10/01/20 09/26/21 Yes Jonathon Bellows, MD  escitalopram (LEXAPRO) 10 MG tablet TAKE 1 TABLET BY MOUTH EVERY DAY 09/13/20  Yes McLean-Scocuzza, Nino Glow, MD  etodolac (LODINE) 500 MG tablet Take 500 mg by mouth at bedtime. 03/22/20  Yes [provider]  famotidine (PEPCID) 20 MG tablet TAKE 1 TABLET (20 MG TOTAL) BY MOUTH DAILY. BEFORE BREAKFAST OR DINNER 05/23/20  Yes McLean-Scocuzza, Nino Glow, MD  fluticasone (FLONASE) 50 MCG/ACT nasal spray Place 2 sprays into both nostrils daily. In am 12/08/19  Yes McLean-Scocuzza, Nino Glow, MD  gabapentin (NEURONTIN) 300 MG capsule Take 2 capsules (600 mg total) by mouth in the morning and at bedtime.  09/03/20  Yes McLean-Scocuzza, Nino Glow, MD  ipratropium-albuterol (DUONEB) 0.5-2.5 (3) MG/3ML SOLN TAKE 3 MLS BY NEBULIZATION 3 (THREE) TIMES DAILY AS NEEDED. 07/29/20  Yes [name], MD  lamoTRIgine (LAMICTAL) 100 MG tablet TAKE 1 TAB 2 TIMES DAILY. FURTHER REFILLS NEW PSYCH FOR ALL PSYCH MEDS ONLY TEMP SUPPLY FROM PCP 09/03/20  Yes McLean-Scocuzza, Nino Glow, MD  leflunomide (ARAVA) 20 MG tablet Take 1 tablet (20 mg total) by mouth daily. 09/30/17  Yes McLean-Scocuzza, Nino Glow, MD  lovastatin (MEVACOR) 20 MG tablet TAKE 1 TABLET BY MOUTH EVERYDAY AT BEDTIME *STOP TALKING 40MG * 07/12/20  Yes McLean-Scocuzza, Nino Glow, MD  montelukast (SINGULAIR) 10 MG tablet TAKE 1 TABLET BY MOUTH EVERY DAY 08/09/20  Yes McLean-Scocuzza, Nino Glow, MD  MYRBETRIQ 50 MG TB24 tablet TAKE 1 TABLET BY MOUTH EVERYDAY AT BEDTIME 04/29/20  Yes Vaillancourt, Samantha, PA-C  nebivolol (BYSTOLIC) 10 MG tablet Take 0.5 tablets (5 mg total) by mouth in the morning and at bedtime. 12/08/19  Yes McLean-Scocuzza, Nino Glow, MD  pantoprazole (PROTONIX) 40 MG tablet TAKE 1 TABLET BY MOUTH 2 TIMES DAILY 30 MIN BEFORE FOOD (NOTE REDUCTION IN FREQUENCY) 07/02/20  Yes McLean-Scocuzza, Nino Glow, MD  predniSONE (DELTASONE) 5 MG tablet TAKE 1.5 TABLETS (7.5 MG TOTAL) BY MOUTH DAILY WITH BREAKFAST. 10/01/20  Yes [name], MD  QUEtiapine (SEROQUEL) 25 MG tablet TAKE 1 TABLET (25 MG TOTAL) BY MOUTH AT BEDTIME. AGAIN LAST FILL FURTHER REFILLS FROM PSYCHIATRY NO EXCEPTIONS 09/13/20  Yes McLean-Scocuzza, Nino Glow, MD  sucralfate (CARAFATE) 1 g tablet Take 1 tablet (1 g total) by mouth 2 (two) times daily. 09/17/20 09/12/21 Yes Jonathon Bellows, MD  amoxicillin-clavulanate (AUGMENTIN) 875-125 MG tablet Take 1 tablet by mouth 2 (two) times daily. With food Patient not taking: Reported on 10/29/2020 09/03/20   McLean-Scocuzza, Nino Glow, MD  benzonatate (TESSALON) 200 MG capsule Take 1 capsule (200 mg total) by mouth 3 (three) times daily as needed for  cough. Patient not taking: Reported on 10/29/2020 09/03/20   McLean-Scocuzza, Nino Glow, MD  predniSONE (STERAPRED UNI-PAK 21 TAB) 10 MG (21) TBPK tablet Take by mouth daily. As directed Patient not taking: Reported on 10/29/2020 09/24/20   Sharion Balloon, NP  zoledronic acid (RECLAST) 5 MG/100ML SOLN injection Inject 5 mg into the vein once. Patient not taking: Reported on 10/29/2020    [provider]    Allergies Ceftin [cefuroxime axetil]; Lisinopril; Sulfa antibiotics; Sulfasalazine; Morphine; Xarelto [rivaroxaban]; Adhesive [tape]; Antihistamines, chlorpheniramine-type; Antivert [meclizine hcl]; Aspirin; Contrast media [iodinated diagnostic agents]; Decongestant [pseudoephedrine hcl]; Doxycycline; Levaquin [levofloxacin in d5w]; Polymyxin b; and Tetanus toxoids  Family History  Problem Relation Age of Onset   Rheum arthritis Mother    Asthma Mother    Parkinson's disease Mother    Heart disease Mother    Stroke Mother    Hypertension Mother    Heart attack Father    Heart disease Father    Hypertension Father    Peripheral Artery Disease Father    Diabetes Son    Gout Son    Asthma Sister    Heart disease Sister    Lung cancer Sister    Heart disease Sister    Heart disease Sister    Breast cancer Sister    Heart attack Sister    Heart disease Brother    Heart disease Maternal Grandmother    Diabetes Maternal Grandmother    Colon cancer Maternal Grandmother    Cancer Maternal Grandmother        Hodgkins lymphoma   Heart disease Brother    Alcohol abuse Brother    Depression Brother    Dementia Son     Social History Social History   Tobacco Use   Smoking status: Former    Packs/day: 0.50    Years: 20.00    Pack years: 10.00    Types: Cigarettes    Quit date: 02/02/1974    Years since quitting: 46.7   Smokeless tobacco: Never  Vaping Use  Vaping Use: Never used  Substance Use Topics   Alcohol use: No   Drug use: No    Review of  Systems Constitutional: No fever/chills Eyes: No visual changes. ENT: No sore throat. Cardiovascular: Denies chest pain. Respiratory: See HPI gastrointestinal: No abdominal pain.   Genitourinary: Negative for dysuria. Musculoskeletal: Negative for back pain except from her left lower back left buttock and the left leg and calf see HPI. Skin: Negative for rash. Neurological: Negative for headaches, areas of focal weakness or numbness.    ____________________________________________   PHYSICAL EXAM:  VITAL SIGNS: ED Triage Vitals  Enc Vitals Group     BP 10/29/20 1243 140/62     Pulse Rate 10/29/20 1243 83     Resp 10/29/20 1243 20     Temp 10/29/20 1243 98.4 F (36.9 C)     Temp Source 10/29/20 1243 Oral     SpO2 10/29/20 1243 97 %     Weight 10/29/20 1312 183 lb (83 kg)     Height 10/29/20 1312 5\' 1"  (1.549 m)     Head Circumference --      Peak Flow --      Pain Score 10/29/20 1312 7     Pain Loc --      Pain Edu? --      Excl. in [place]? --     Constitutional: Alert and oriented. Well appearing and in no acute distress while at rest but if she moves rolls about her tries to sit up she reports a lancinating pain from her buttock into her left leg. Eyes: Conjunctivae are normal. Head: Atraumatic. Nose: No congestion/rhinnorhea. Mouth/Throat: Mucous membranes are moist. Neck: No stridor.  Cardiovascular: Normal rate, regular rhythm. Grossly normal heart sounds.  Good peripheral circulation. Respiratory: Accessory muscle use, occasional cough, mild end expiratory wheezing.  No acute extremitas but does show some evidence of increased work of breathing.  No focal rales or crackles noted. Gastrointestinal: Soft and nontender. No distention. Musculoskeletal: No lower extremity tenderness nor edema.  She does however report quite a bit of pain radiating from the buttock into the left leg with motion around the left hip particularly flexion extension for which she reports has been  an ongoing issue and denies any trauma occurring. Neurologic:  Normal speech and language. No gross focal neurologic deficits are appreciated.  Skin:  Skin is warm, dry and intact. No rash noted. Psychiatric: Mood and affect are normal. Speech and behavior are normal.  ____________________________________________   LABS (all labs ordered are listed, but only abnormal results are displayed)  Labs Reviewed  COMPREHENSIVE METABOLIC PANEL - Abnormal; Notable for the following components:      Result Value   Glucose, Bld 146 (*)    Creatinine, Ser 1.24 (*)    Calcium 8.4 (*)    Albumin 3.4 (*)    GFR, Estimated 45 (*)    All other components within normal limits  BRAIN NATRIURETIC PEPTIDE - Abnormal; Notable for the following components:   B Natriuretic Peptide 209.3 (*)    All other components within normal limits  CBC WITH DIFFERENTIAL/PLATELET - Abnormal; Notable for the following components:   WBC 12.2 (*)    Hemoglobin 11.8 (*)    Eosinophils Absolute 0.6 (*)    Abs Immature Granulocytes 0.15 (*)    All other components within normal limits  RESP PANEL BY RT-PCR (FLU A&B, COVID) [id number]  BASIC METABOLIC PANEL  CBC  TROPONIN I (HIGH SENSITIVITY)  TROPONIN I (HIGH  SENSITIVITY)   ____________________________________________  EKG  Reviewed interpreted by me at 09/15/2018 Heart rate 89 QRS 80 QTc 430 Possible accelerated junctional rhythm, could potentially also represent sinus with small P waves noted.  No evidence acute ischemia ____________________________________________  RADIOLOGY  DG Chest 2 View  Result Date: 10/29/2020 CLINICAL DATA:  Shortness of breath EXAM: CHEST - 2 VIEW COMPARISON:  10/08/2020 FINDINGS: The heart size and mediastinal contours are within normal limits. Both lungs are clear. Status post bilateral reverse shoulder arthroplasty. Multiple chronic, callused fractures of the right ribs. Disc degenerative disease of the thoracic spine. IMPRESSION: No  acute abnormality of the lungs. Electronically Signed   By: Eddie Candle M.D.   On: 10/29/2020 14:39   DG Hip Unilat W or Wo Pelvis 2-3 Views Left  Result Date: 10/29/2020 CLINICAL DATA:  Left hip pain, does the EXAM: DG HIP (WITH OR WITHOUT PELVIS) 2-3V LEFT COMPARISON:  12/10/2011 left hip radiographs FINDINGS: Status post left total hip arthroplasty. Partially visualized right total hip arthroplasty. No evidence of hardware fracture or loosening. No pelvic fracture or diastasis. No left hip osseous fracture or dislocation. No focal osseous lesions. Bilateral posterior spinal fusion in the lower lumbar spine. IMPRESSION: No acute osseous abnormality. Bilateral total hip arthroplasty with no evidence of hardware complication. Electronically Signed   By: Ilona Sorrel M.D.   On: 10/29/2020 14:44    Chest x-ray reviewed negative for acute.  Left hip imaging reviewed negative for acute  ____________________________________________   PROCEDURES  Procedure(s) performed: None  Procedures  Critical Care performed: No  ____________________________________________   INITIAL IMPRESSION / ASSESSMENT AND PLAN / ED COURSE  Pertinent labs & imaging results that were available during my care of the patient were reviewed by me and considered in my medical decision making (see chart for details).   Patient presents for dyspnea, noted to have wheezing symptoms seem most consistent with bronchospasm likely COPD or asthma type exacerbation.  Also on chronic prednisone therapy.  Administered Solu-Medrol, multiple nebs, certainly showed some improvement but does have oxygen requirement while awake, oxygen saturations dipping down at about 89% range on 2 L.  Thankfully she is responded well to therapy but continues to have ongoing symptomatology and given the need for aggressive treatment slower Ponstel therapy discussed with the patient we will admit for further care management.  She does not have evidence to  support acute ACS, I do not see clinical evidence to suggest dissection PE pneumothorax or other acute obvious pathology.  We will treat with Augmentin as well, multiple antibiotic allergies also noted reports good response to Augmentin in the past.  Clinical symptoms me seem most consistent with COPD type exacerbation.  In addition left lower extremity discomfort with movement, following closely with neurosurgery with planned operative care.  This does not appear to be an acute worsening, reassuring exam reassuring lower extremity exam without evidence of distal or extremity ischemia.  Patient and son both understand agreeable with plan for admission.  Admission discussed with hospitalist Dr. Sidney Ace      ____________________________________________   FINAL CLINICAL IMPRESSION(S) / ED DIAGNOSES  Final diagnoses:  COPD exacerbation ([place])  Shortness of breath        Note:  This document was prepared using Dragon voice recognition software and may include unintentional dictation errors       Delman Kitten, MD 10/29/20 2352

## 2020-10-29 NOTE — ED Notes (Signed)
Pt requesting pain medication and ice.

## 2020-10-29 NOTE — H&P (Signed)
[place]   PATIENT NAME: Kerri Carter    MR#:  [id number]  DATE OF BIRTH:  [birth year]  DATE OF ADMISSION:  10/29/2020  PRIMARY CARE PHYSICIAN: McLean-Scocuzza, Nino Glow, MD   Patient is coming from: Home  REQUESTING/REFERRING PHYSICIAN: Delman Kitten, MD  CHIEF COMPLAINT:   Chief Complaint  Patient presents with  . Shortness of Breath    HISTORY OF PRESENT ILLNESS:  Kerri Carter is a 80 y.o. female with medical history significant for asthma, stage III chronic kidney disease, COPD, hypertension, GERD, interstitial lung disease, PAD and PVD, who presented to the ER with acute onset of worsening dyspnea with associated cough productive of yellowish sputum as well as wheezing over the last few days.  She denies any fever or chills.  No nausea or vomiting or abdominal pain.  No chest pain or palpitations.  No headache or dizziness or blurred vision.  She uses oxygen at home at night as needed.  Her bronchodilator MDI have not helped today.  She has left lower leg pain as well as sciatic pain with plan for operative intervention by Dr. Cari Caraway in about a week.  ED Course: Upon presentation to the emergency room, Pulsoxymeter was 97% on 4 L of O2 by nasal cannula.  Vital signs otherwise were within normal.  Labs revealed a creatinine of 1.24 with albumin of 3.4 and CMP was otherwise within normal.  BNP was 209.3 and high-sensitivity troponin I was 11.  CBC showed leukocytosis of 12.2 with mild anemia.  Influenza antigens and COVID-19 PCR came back negative.    EKG as reviewed by me :  showed X-rated junctional rhythm with a rate of 81 and Imaging: Chest x-ray showed no acute cardiopulmonary disease.  Left hip x-ray showed no acute abnormality.  She has bilateral total hip arthroplasty with no evidence for hardware complication.  The patient was given 125 mg IV Solu-Medrol and 3 DuoNeb's as well as nebulized Abrol 2.5 mg and 1 p.o. Norco.  She will be admitted to a medical monitored  bed for further evaluation and management. PAST MEDICAL HISTORY:   Past Medical History:  Diagnosis Date  . Anxiety   . Asthma   . Bipolar disorder ([place])   . C. difficile diarrhea    from Abx 2010 hospitalized   . Cataract    s/p b/l repair   . Chicken pox   . CKD (chronic kidney disease)   . CKD (chronic kidney disease), stage III ([place])    a. s/p R nephrectomy./ aneurysm  . Conversion disorder   . COPD (chronic obstructive pulmonary disease) ([place])   . Depression   . Essential hypertension   . GERD (gastroesophageal reflux disease)   . Hyperlipidemia   . ILD (interstitial lung disease) ([place])    mild; 2/2 RA diagnosis  . Inflammatory arthritis    a. hands/carpal tunnel.  b. Low titer rheumatoid factor. c. Negative anti-CCP antibodies. d. Plaquenil.  . Macular degeneration   . Nocturnal hypoxemia   . Non-Obstructive CAD    a. 07/2009 Cath ([facility]): nonobs dzs;  b. 03/2011 Cath [facility]): nonobs dzs.  . Osteoarthritis    a. Knees.  Marland Kitchen PAD (peripheral artery disease) ([place])   . PUD (peptic ulcer disease)   . S/P right hip fracture    11/01/16 s/p repair  . Shoulder pain   . Sleep apnea    no cpap / minimal  . Spinal stenosis at L4-L5 level    severe  with L4/L5 anterolisthesis grade 1 anterolisthesis   . Toxic maculopathy   . Valvular heart disease    a. 07/2015 Echo: EF 55-60%, Mild AI, AS, MR, and TR.    PAST SURGICAL HISTORY:   Past Surgical History:  Procedure Laterality Date  . APPENDECTOMY    . BACK SURGERY     lumbar fusion  . BUNIONECTOMY Right   . CATARACT EXTRACTION, BILATERAL    . CESAREAN SECTION     x1  . CHOLECYSTECTOMY N/A 05/11/2016   Procedure: LAPAROSCOPIC CHOLECYSTECTOMY;  Surgeon: Florene Glen, MD;  Location: [facility] ORS;  Service: General;  Laterality: N/A;  . COLONOSCOPY WITH PROPOFOL N/A 04/02/2016   Procedure: COLONOSCOPY WITH PROPOFOL;  Surgeon: Jonathon Bellows, MD;  Location: [facility] ENDOSCOPY;  Service: Endoscopy;  Laterality: N/A;  . ENDOSCOPIC RETROGRADE  CHOLANGIOPANCREATOGRAPHY (ERCP) WITH PROPOFOL N/A 05/08/2016   Procedure: ENDOSCOPIC RETROGRADE CHOLANGIOPANCREATOGRAPHY (ERCP) WITH PROPOFOL;  Surgeon: Lucilla Lame, MD;  Location: [facility] ENDOSCOPY;  Service: Endoscopy;  Laterality: N/A;  . ERCP     with biliary spincterotomy 05/08/16 Dr. Allen Norris for choledocholithiasis   . ESOPHAGEAL DILATION  04/02/2016   Procedure: ESOPHAGEAL DILATION;  Surgeon: Jonathon Bellows, MD;  Location: [facility] ENDOSCOPY;  Service: Endoscopy;;  . ESOPHAGOGASTRODUODENOSCOPY (EGD) WITH PROPOFOL N/A 04/02/2016   Procedure: ESOPHAGOGASTRODUODENOSCOPY (EGD) WITH PROPOFOL;  Surgeon: Jonathon Bellows, MD;  Location: [facility] ENDOSCOPY;  Service: Endoscopy;  Laterality: N/A;  . HIP ARTHROPLASTY Right 11/01/2016   Procedure: ARTHROPLASTY BIPOLAR HIP (HEMIARTHROPLASTY);  Surgeon: Corky Mull, MD;  Location: [facility] ORS;  Service: Orthopedics;  Laterality: Right;  . 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 ORIF SCAPHOID FRACTURE Left 12/21/2019   Procedure: OPEN REDUCTION INTERNAL FIXATION (ORIF) OF LEFT SCAPULAR NONUNION WITH BONE GRAFT;  Surgeon: Corky Mull, MD;  Location: [facility] ORS;  Service: Orthopedics;  Laterality: Left;  . osteoporosis     noted DEXA 08/19/16   . REPLACEMENT TOTAL KNEE Right   . REVERSE SHOULDER ARTHROPLASTY Right 11/04/2017   Procedure: REVERSE SHOULDER ARTHROPLASTY;  Surgeon: Corky Mull, MD;  Location: [facility] ORS;  Service: Orthopedics;  Laterality: Right;  . REVERSE SHOULDER ARTHROPLASTY Left 07/26/2018   Procedure: REVERSE SHOULDER ARTHROPLASTY;  Surgeon: Corky Mull, MD;  Location: [facility] ORS;  Service: Orthopedics;  Laterality: Left;  . TONSILLECTOMY    . TOTAL HIP ARTHROPLASTY  12/10/11   [facility] left hip  . TOTAL HIP ARTHROPLASTY Bilateral   . TUBAL LIGATION      SOCIAL HISTORY:   Social History   Tobacco Use  . Smoking status: Former    Packs/day: 0.50    Years: 20.00    Pack years: 10.00    Types: Cigarettes    Quit  date: 02/02/1974    Years since quitting: 46.7  . Smokeless tobacco: Never  Substance Use Topics  . Alcohol use: No    FAMILY HISTORY:   Family History  Problem Relation Age of Onset  . Rheum arthritis Mother   . Asthma Mother   . Parkinson's disease Mother   . Heart disease Mother   . Stroke Mother   . Hypertension Mother   . Heart attack Father   . Heart disease Father   . Hypertension Father   . Peripheral Artery Disease Father   . Diabetes Son   . Gout Son   . Asthma Sister   . Heart disease Sister   . Lung cancer Sister   . Heart disease  Sister   . Heart disease Sister   . Breast cancer Sister   . Heart attack Sister   . Heart disease Brother   . Heart disease Maternal Grandmother   . Diabetes Maternal Grandmother   . Colon cancer Maternal Grandmother   . Cancer Maternal Grandmother        Hodgkins lymphoma  . Heart disease Brother   . Alcohol abuse Brother   . Depression Brother   . Dementia Son     DRUG ALLERGIES:   Allergies  Allergen Reactions  . Ceftin [Cefuroxime Axetil] Anaphylaxis  . Lisinopril Anaphylaxis  . Sulfa Antibiotics Other (See Comments)    Face swelling  . Sulfasalazine Anaphylaxis  . Morphine Other (See Comments)    Per patient, low blood pressure issues that requires action to raise it back up. Can take small infrequent doses  . Xarelto [Rivaroxaban] Other (See Comments)    Stomach burning, bleeding, and tar in stool  . Adhesive [Tape] Rash    Paper tape and tega derm OK  . Antihistamines, Chlorpheniramine-Type Other (See Comments)    Makes pt hyper  . Antivert [Meclizine Hcl] Other (See Comments)    Bladder will not empty  . Aspirin Other (See Comments)    Sulfasalazine allergy cross reacts  . Contrast Media [Iodinated Diagnostic Agents] Rash    she is able to use betadine scrubs.  [place] [Pseudoephedrine Hcl] Other (See Comments)    Makes pt hyper  . Doxycycline Other (See Comments)    GI upset  . Levaquin  [Levofloxacin In D5w] Rash  . Polymyxin B Other (See Comments)    Facial rash  . Tetanus Toxoids Rash and Other (See Comments)    Fever and hot to touch at injection site    REVIEW OF SYSTEMS:   ROS As per history of present illness. All pertinent systems were reviewed above. Constitutional, HEENT, cardiovascular, respiratory, GI, GU, musculoskeletal, neuro, psychiatric, endocrine, integumentary and hematologic systems were reviewed and are otherwise negative/unremarkable except for positive findings mentioned above in the HPI.   MEDICATIONS AT HOME:   Prior to Admission medications   Medication Sig Start Date End Date Taking? Authorizing Provider  Adalimumab 40 MG/0.4ML PNKT Inject 40 mg into the skin every 14 (fourteen) days.     [provider]  albuterol (VENTOLIN HFA) 108 (90 Base) MCG/ACT inhaler Inhale 2 puffs into the lungs every 6 (six) hours as needed for wheezing or shortness of breath. 09/03/20   McLean-Scocuzza, Nino Glow, MD  ALPRAZolam (XANAX) 0.25 MG tablet TAKE 1 TABLET BY MOUTH AT BEDTIME AS NEEDED FOR ANXIETY. 06/20/20   McLean-Scocuzza, Nino Glow, MD  amLODipine (NORVASC) 2.5 MG tablet Take 1 tablet (2.5 mg total) by mouth daily. 09/03/20   McLean-Scocuzza, Nino Glow, MD  amoxicillin-clavulanate (AUGMENTIN) 875-125 MG tablet Take 1 tablet by mouth 2 (two) times daily. With food 09/03/20   McLean-Scocuzza, Nino Glow, MD  benzonatate (TESSALON) 200 MG capsule Take 1 capsule (200 mg total) by mouth 3 (three) times daily as needed for cough. 09/03/20   McLean-Scocuzza, Nino Glow, MD  budesonide (PULMICORT) 0.25 MG/2ML nebulizer solution USE 1 VIAL  IN  NEBULIZER TWICE  DAILY - rinse mouth after treatment 12/01/19   [name], MD  dicyclomine (BENTYL) 10 MG capsule Take 1 capsule (10 mg total) by mouth 2 (two) times daily as needed for spasms. 10/01/20 09/26/21  Jonathon Bellows, MD  escitalopram (LEXAPRO) 10 MG tablet TAKE 1 TABLET BY MOUTH EVERY DAY 09/13/20  McLean-Scocuzza, Nino Glow, MD  etodolac (LODINE) 500 MG tablet Take 500 mg by mouth at bedtime. 03/22/20   [provider]  famotidine (PEPCID) 20 MG tablet TAKE 1 TABLET (20 MG TOTAL) BY MOUTH DAILY. BEFORE BREAKFAST OR DINNER 05/23/20   McLean-Scocuzza, Nino Glow, MD  fluticasone (FLONASE) 50 MCG/ACT nasal spray Place 2 sprays into both nostrils daily. In am 12/08/19   McLean-Scocuzza, Nino Glow, MD  gabapentin (NEURONTIN) 300 MG capsule Take 2 capsules (600 mg total) by mouth in the morning and at bedtime. 09/03/20   McLean-Scocuzza, Nino Glow, MD  ipratropium-albuterol (DUONEB) 0.5-2.5 (3) MG/3ML SOLN TAKE 3 MLS BY NEBULIZATION 3 (THREE) TIMES DAILY AS NEEDED. 07/29/20   [name], MD  lamoTRIgine (LAMICTAL) 100 MG tablet TAKE 1 TAB 2 TIMES DAILY. FURTHER REFILLS NEW PSYCH FOR ALL PSYCH MEDS ONLY TEMP SUPPLY FROM PCP 09/03/20   McLean-Scocuzza, Nino Glow, MD  leflunomide (ARAVA) 20 MG tablet Take 1 tablet (20 mg total) by mouth daily. 09/30/17   McLean-Scocuzza, Nino Glow, MD  lovastatin (MEVACOR) 20 MG tablet TAKE 1 TABLET BY MOUTH EVERYDAY AT BEDTIME *STOP TALKING 40MG * 07/12/20   McLean-Scocuzza, Nino Glow, MD  montelukast (SINGULAIR) 10 MG tablet TAKE 1 TABLET BY MOUTH EVERY DAY 08/09/20   McLean-Scocuzza, Nino Glow, MD  MYRBETRIQ 50 MG TB24 tablet TAKE 1 TABLET BY MOUTH EVERYDAY AT BEDTIME 04/29/20   Vaillancourt, Samantha, PA-C  nebivolol (BYSTOLIC) 10 MG tablet Take 0.5 tablets (5 mg total) by mouth in the morning and at bedtime. 12/08/19   McLean-Scocuzza, Nino Glow, MD  pantoprazole (PROTONIX) 40 MG tablet TAKE 1 TABLET BY MOUTH 2 TIMES DAILY 30 MIN BEFORE FOOD (NOTE REDUCTION IN FREQUENCY) 07/02/20   McLean-Scocuzza, Nino Glow, MD  predniSONE (DELTASONE) 5 MG tablet TAKE 1.5 TABLETS (7.5 MG TOTAL) BY MOUTH DAILY WITH BREAKFAST. 10/01/20   [name], MD  predniSONE (STERAPRED UNI-PAK 21 TAB) 10 MG (21) TBPK tablet Take by mouth daily. As directed 09/24/20   Sharion Balloon, NP  QUEtiapine (SEROQUEL) 25 MG tablet TAKE 1  TABLET (25 MG TOTAL) BY MOUTH AT BEDTIME. AGAIN LAST FILL FURTHER REFILLS FROM PSYCHIATRY NO EXCEPTIONS 09/13/20   McLean-Scocuzza, Nino Glow, MD  sucralfate (CARAFATE) 1 g tablet Take 1 tablet (1 g total) by mouth 2 (two) times daily. 09/17/20 09/12/21  Jonathon Bellows, MD  zoledronic acid (RECLAST) 5 MG/100ML SOLN injection Inject 5 mg into the vein once.    [provider]      VITAL SIGNS:  Blood pressure (!) 142/54, pulse 94, temperature 98.4 F (36.9 C), temperature source Oral, resp. rate (!) 22, height 5\' 1"  (1.549 m), weight 83 kg, SpO2 93 %.  PHYSICAL EXAMINATION:  Physical Exam  GENERAL:  80 y.o.-year-old patient lying in the bed with mild respiratory distress with conversational dyspnea. EYES: Pupils equal, round, reactive to light and accommodation. No scleral icterus. Extraocular muscles intact.  HEENT: Head atraumatic, normocephalic. Oropharynx and nasopharynx clear.  NECK:  Supple, no jugular venous distention. No thyroid enlargement, no tenderness.  LUNGS: Diffuse expiratory wheezes with tight expiratory airflow and harsh vesicular breathing. CARDIOVASCULAR: Regular rate and rhythm, S1, S2 normal. No murmurs, rubs, or gallops.  ABDOMEN: Soft, nondistended, nontender. Bowel sounds present. No organomegaly or mass.  EXTREMITIES: No pedal edema, cyanosis, or clubbing.  NEUROLOGIC: Cranial nerves II through XII are intact. Muscle strength 5/5 in all extremities. Sensation intact. Gait not checked.  PSYCHIATRIC: The patient is alert and oriented x 3.  Normal  affect and good eye contact. SKIN: No obvious rash, lesion, or ulcer.   LABORATORY PANEL:   CBC Recent Labs  Lab 10/29/20 1322  WBC 12.2*  HGB 11.8*  HCT 37.1  PLT 222   ------------------------------------------------------------------------------------------------------------------  Chemistries  Recent Labs  Lab 10/29/20 1322  NA 139  K 4.5  CL 102  CO2 26  GLUCOSE 146*  BUN 15  CREATININE 1.24*   CALCIUM 8.4*  AST 20  ALT 16  ALKPHOS 77  BILITOT 0.6   ------------------------------------------------------------------------------------------------------------------  Cardiac Enzymes No results for input(s): TROPONINI in the last 168 hours. ------------------------------------------------------------------------------------------------------------------  RADIOLOGY:  DG Chest 2 View  Result Date: 10/29/2020 CLINICAL DATA:  Shortness of breath EXAM: CHEST - 2 VIEW COMPARISON:  10/08/2020 FINDINGS: The heart size and mediastinal contours are within normal limits. Both lungs are clear. Status post bilateral reverse shoulder arthroplasty. Multiple chronic, callused fractures of the right ribs. Disc degenerative disease of the thoracic spine. IMPRESSION: No acute abnormality of the lungs. Electronically Signed   By: Eddie Candle M.D.   On: 10/29/2020 14:39   DG Hip Unilat W or Wo Pelvis 2-3 Views Left  Result Date: 10/29/2020 CLINICAL DATA:  Left hip pain, does the EXAM: DG HIP (WITH OR WITHOUT PELVIS) 2-3V LEFT COMPARISON:  12/10/2011 left hip radiographs FINDINGS: Status post left total hip arthroplasty. Partially visualized right total hip arthroplasty. No evidence of hardware fracture or loosening. No pelvic fracture or diastasis. No left hip osseous fracture or dislocation. No focal osseous lesions. Bilateral posterior spinal fusion in the lower lumbar spine. IMPRESSION: No acute osseous abnormality. Bilateral total hip arthroplasty with no evidence of hardware complication. Electronically Signed   By: Ilona Sorrel M.D.   On: 10/29/2020 14:44      IMPRESSION AND PLAN:  Active Problems:   COPD exacerbation ([place])  1.  COPD and asthma acute exacerbation like secondary to acute bronchitis. - The patient will be admitted to a medical monitored bed. - We will continue bronchodilator therapy with DuoNebs 4 times daily and every 4 hours as needed. - We will place her on mucolytic  therapy. - We will add antibiotic therapy with IV Rocephin and p.o. Zithromax given purulent sputum production. - We will continue steroid therapy with IV Solu-Medrol. - We will follow sputum culture. - We will continue Pulmicort and Singulair.  2.  GERD. - We will continue PPI therapy and Carafate.  3.  Essential hypertension. - We will continue amlodipine.  4.  Depression. - We will continue Lexapro and Seroquel.  5.  Rheumatoid arthritis. - Patient is on immune modulator therapy.  6.  Anxiety. - We will continue Xanax.   DVT prophylaxis: Lovenox.  Code Status: full code.. Family Communication:  The plan of care was discussed in details with the patient (and family). I answered all questions. The patient agreed to proceed with the above mentioned plan. Further management will depend upon hospital course. Disposition Plan: Back to previous home environment Consults called: none.. All the records are reviewed and case discussed with ED provider.  Status is: Inpatient  Remains inpatient appropriate because:Ongoing diagnostic testing needed not appropriate for outpatient work up, Unsafe d/c plan, IV treatments appropriate due to intensity of illness or inability to take PO, and Inpatient level of care appropriate due to severity of illness  Dispo: The patient is from: Home              Anticipated d/c is to: Home  Patient currently is not medically stable to d/c.   Difficult to place patient No  TOTAL TIME TAKING CARE OF THIS PATIENT: 55 minutes.    Christel Mormon M.D on 10/29/2020 at 10:32 PM  [facility]   From 7 PM-7 AM, contact night-coverage [email or web address]  CC: Primary care physician; McLean-Scocuzza, Nino Glow, MD

## 2020-10-29 NOTE — ED Notes (Signed)
Breathing treatment administered

## 2020-10-29 NOTE — ED Notes (Signed)
Pt placed on monitor.  

## 2020-10-29 NOTE — ED Triage Notes (Signed)
Pt comes into the ED via EMS from home with c/o SOB since around 10am, audible wheezing, O2 mid to high 80's, on 4L 91%, gave duoneb treatment 98%  COPD/ashtma hx  144/55 96HR 97% on 4L [place]

## 2020-10-30 DIAGNOSIS — M069 Rheumatoid arthritis, unspecified: Secondary | ICD-10-CM

## 2020-10-30 LAB — CBC
HCT: 36.4 % (ref 36.0–46.0)
Hemoglobin: 11.4 g/dL — ABNORMAL LOW (ref 12.0–15.0)
MCH: 28.1 pg (ref 26.0–34.0)
MCHC: 31.3 g/dL (ref 30.0–36.0)
MCV: 89.9 fL (ref 80.0–100.0)
Platelets: 217 10*3/uL (ref 150–400)
RBC: 4.05 MIL/uL (ref 3.87–5.11)
RDW: 14.6 % (ref 11.5–15.5)
WBC: 8.1 10*3/uL (ref 4.0–10.5)
nRBC: 0 % (ref 0.0–0.2)

## 2020-10-30 LAB — BASIC METABOLIC PANEL
Anion gap: 10 (ref 5–15)
BUN: 15 mg/dL (ref 8–23)
CO2: 28 mmol/L (ref 22–32)
Calcium: 8.5 mg/dL — ABNORMAL LOW (ref 8.9–10.3)
Chloride: 103 mmol/L (ref 98–111)
Creatinine, Ser: 1.21 mg/dL — ABNORMAL HIGH (ref 0.44–1.00)
GFR, Estimated: 46 mL/min — ABNORMAL LOW (ref >60)
Glucose, Bld: 207 mg/dL — ABNORMAL HIGH (ref 70–99)
Potassium: 4.1 mmol/L (ref 3.5–5.1)
Sodium: 141 mmol/L (ref 135–145)

## 2020-10-30 LAB — TROPONIN I (HIGH SENSITIVITY): Troponin I (High Sensitivity): 15 ng/L (ref <18)

## 2020-10-30 LAB — CBG MONITORING, ED
Glucose-Capillary: 237 mg/dL — ABNORMAL HIGH (ref 70–99)
Glucose-Capillary: 196 mg/dL — ABNORMAL HIGH (ref 70–99)
Glucose-Capillary: 172 mg/dL — ABNORMAL HIGH (ref 70–99)

## 2020-10-30 LAB — HEMOGLOBIN A1C
Hgb A1c MFr Bld: 6.1 % — ABNORMAL HIGH (ref 4.8–5.6)
Mean Plasma Glucose: 128 mg/dL

## 2020-10-30 MED ORDER — GUAIFENESIN ER 600 MG PO TB12
1200.0000 mg | ORAL_TABLET | Freq: Two times a day (BID) | ORAL | Status: DC
  Administered 2020-10-30 – 2020-11-04 (×11): 1200 mg via ORAL
  Filled 2020-10-30 (×11): qty 2

## 2020-10-30 MED ORDER — INSULIN ASPART 100 UNIT/ML IJ SOLN
0.0000 [IU] | Freq: Every day | INTRAMUSCULAR | Status: DC
  Administered 2020-11-03: 2 [IU] via SUBCUTANEOUS
  Filled 2020-10-30: qty 1

## 2020-10-30 MED ORDER — AZITHROMYCIN 500 MG PO TABS
500.0000 mg | ORAL_TABLET | Freq: Every day | ORAL | Status: AC
  Administered 2020-10-30: 500 mg via ORAL
  Filled 2020-10-30: qty 1

## 2020-10-30 MED ORDER — AZITHROMYCIN 250 MG PO TABS
250.0000 mg | ORAL_TABLET | Freq: Every day | ORAL | Status: AC
  Administered 2020-10-31 – 2020-11-03 (×4): 250 mg via ORAL
  Filled 2020-10-30 (×4): qty 1

## 2020-10-30 MED ORDER — INSULIN ASPART 100 UNIT/ML IJ SOLN
0.0000 [IU] | Freq: Three times a day (TID) | INTRAMUSCULAR | Status: DC
  Administered 2020-10-30: 3 [IU] via SUBCUTANEOUS
  Administered 2020-10-30: 5 [IU] via SUBCUTANEOUS
  Administered 2020-10-31: 2 [IU] via SUBCUTANEOUS
  Administered 2020-10-31: 5 [IU] via SUBCUTANEOUS
  Administered 2020-10-31: 3 [IU] via SUBCUTANEOUS
  Administered 2020-11-01: 8 [IU] via SUBCUTANEOUS
  Administered 2020-11-01: 2 [IU] via SUBCUTANEOUS
  Administered 2020-11-01: 8 [IU] via SUBCUTANEOUS
  Administered 2020-11-02: 5 [IU] via SUBCUTANEOUS
  Administered 2020-11-02: 8 [IU] via SUBCUTANEOUS
  Administered 2020-11-02: 2 [IU] via SUBCUTANEOUS
  Administered 2020-11-03: 8 [IU] via SUBCUTANEOUS
  Administered 2020-11-03: 3 [IU] via SUBCUTANEOUS
  Administered 2020-11-04: 8 [IU] via SUBCUTANEOUS
  Filled 2020-10-30 (×14): qty 1

## 2020-10-30 MED ORDER — METHYLPREDNISOLONE SODIUM SUCC 125 MG IJ SOLR
80.0000 mg | Freq: Every day | INTRAMUSCULAR | Status: DC
  Administered 2020-10-30 – 2020-10-31 (×2): 80 mg via INTRAVENOUS
  Filled 2020-10-30: qty 2

## 2020-10-30 NOTE — ED Notes (Signed)
Family with pt

## 2020-10-30 NOTE — ED Notes (Signed)
Pt freshened up, given bed bath with wipes. Gown placed on pt. Fresh warm blankets provided. Denies further needs at this time. Lights dimmed per pt request.

## 2020-10-30 NOTE — Progress Notes (Signed)
PT Cancellation Note  Patient Details Name: Kerri Carter MRN: [id number] DOB: May 01, 1940   Cancelled Treatment:    Reason Eval/Treat Not Completed: Fatigue/lethargy limiting ability to participate Orders received and chart review completed. Attempted to see pt however, patient sound asleep and unable to wake after multiple attempts. Pt at 88% on RA and nurse contacted via [place] regarding oxygen saturation level. Will re-attempt at later time.  Andrey Campanile, SPT  Andrey Campanile 10/30/2020, 1:47 PM

## 2020-10-30 NOTE — ED Notes (Signed)
IV team at bedside 

## 2020-10-30 NOTE — Progress Notes (Signed)
PROGRESS NOTE    Kerri Carter  MGQ:[id number] DOB: 14-Jul-1940 DOA: 10/29/2020 PCP: McLean-Scocuzza, Nino Glow, MD    Brief Narrative:  80 year old female with a history of asthma/COPD, chronic kidney disease stage III, hypertension, interstitial lung disease, presents to the hospital worsening shortness of breath.  She was found to have possible COPD exacerbation and started on intravenous steroids, bronchodilators and antibiotics.  Imaging of chest did not show any evidence of pneumonia.   Assessment & Plan:   Active Problems:   COPD exacerbation (HCC)   COPD exacerbation -Patient has a history of prednisone dependent COPD -She uses oxygen at night and during the day as needed -She is normally able to ambulate around her home without becoming short of breath -She was started on intravenous steroids, but continues to have significant wheezing -Continue on IV steroids for now -Start on guaifenesin -Continue on bronchodilators and inhaled steroids -She is followed by Dr. Patsey Berthold, pulmonologist in the outpatient setting  GERD -Continue PPI  Hypertension -Continued on amlodipine  Depression -She is continued on Lexapro and Seroquel  Rheumatoid arthritis -Continue on leflunomide  Anxiety -Continued on Xanax  Generalized weakness -PT evaluation   DVT prophylaxis: enoxaparin (LOVENOX) injection 40 mg Start: 10/29/20 2245  Code Status: Full code Family Communication: Discussed with patient Disposition Plan: Status is: Inpatient  Remains inpatient appropriate because:IV treatments appropriate due to intensity of illness or inability to take PO and Inpatient level of care appropriate due to severity of illness  Dispo: The patient is from: Home              Anticipated d/c is to: Home              Patient currently is not medically stable to d/c.   Difficult to place patient No    Consultants:    Procedures:    Antimicrobials:  Azithromycin  9/28>   Subjective: She said that she is still wheezing.  She has nonproductive cough.  Does not feel that her breathing is back to baseline.  Objective: Vitals:   10/30/20 0530 10/30/20 0600 10/30/20 0800 10/30/20 0830  BP: (!) 121/57 (!) 163/60 (!) 157/71 (!) 144/79  Pulse: 86 84 83 86  Resp: 16 14 13  (!) 23  Temp:      TempSrc:      SpO2: 94% 92% 99% 98%  Weight:      Height:       No intake or output data in the 24 hours ending 10/30/20 1129 Filed Weights   10/29/20 1312  Weight: 83 kg    Examination:  General exam: Appears calm and comfortable  Respiratory system: bilateral wheezing Respiratory effort normal. Cardiovascular system: S1 & S2 heard, RRR. No JVD, murmurs, rubs, gallops or clicks. No pedal edema. Gastrointestinal system: Abdomen is nondistended, soft and nontender. No organomegaly or masses felt. Normal bowel sounds heard. Central nervous system: Alert and oriented. No focal neurological deficits. Extremities: Symmetric 5 x 5 power. Skin: No rashes, lesions or ulcers Psychiatry: Judgement and insight appear normal. Mood & affect appropriate.     Data Reviewed: I have personally reviewed following labs and imaging studies  CBC: Recent Labs  Lab 10/29/20 1322 10/30/20 0635  WBC 12.2* 8.1  NEUTROABS 7.7  --   HGB 11.8* 11.4*  HCT 37.1 36.4  MCV 90.9 89.9  PLT 222 267   Basic Metabolic Panel: Recent Labs  Lab 10/29/20 1322 10/30/20 0635  NA 139 141  K 4.5 4.1  CL 102  103  CO2 26 28  GLUCOSE 146* 207*  BUN 15 15  CREATININE 1.24* 1.21*  CALCIUM 8.4* 8.5*   GFR: Estimated Creatinine Clearance: 37.4 mL/min (A) (by C-G formula based on SCr of 1.21 mg/dL (H)). Liver Function Tests: Recent Labs  Lab 10/29/20 1322  AST 20  ALT 16  ALKPHOS 77  BILITOT 0.6  PROT 6.7  ALBUMIN 3.4*   No results for input(s): LIPASE, AMYLASE in the last 168 hours. No results for input(s): AMMONIA in the last 168 hours. Coagulation Profile: No results  for input(s): INR, PROTIME in the last 168 hours. Cardiac Enzymes: No results for input(s): CKTOTAL, CKMB, CKMBINDEX, TROPONINI in the last 168 hours. BNP (last 3 results) No results for input(s): PROBNP in the last 8760 hours. HbA1C: No results for input(s): HGBA1C in the last 72 hours. CBG: No results for input(s): GLUCAP in the last 168 hours. Lipid Profile: No results for input(s): CHOL, HDL, LDLCALC, TRIG, CHOLHDL, LDLDIRECT in the last 72 hours. Thyroid Function Tests: No results for input(s): TSH, T4TOTAL, FREET4, T3FREE, THYROIDAB in the last 72 hours. Anemia Panel: No results for input(s): VITAMINB12, FOLATE, FERRITIN, TIBC, IRON, RETICCTPCT in the last 72 hours. Sepsis Labs: No results for input(s): PROCALCITON, LATICACIDVEN in the last 168 hours.  Recent Results (from the past 240 hour(s))  Resp Panel by RT-PCR (Flu A&B, Covid) Nasopharyngeal Swab     Status: None   Collection Time: 10/29/20  1:22 PM   Specimen: Nasopharyngeal Swab; Nasopharyngeal(NP) swabs in vial transport medium  Result Value Ref Range Status   SARS Coronavirus 2 by RT PCR NEGATIVE NEGATIVE Final    Comment: (NOTE) SARS-CoV-2 target nucleic acids are NOT DETECTED.  The SARS-CoV-2 RNA is generally detectable in upper respiratory specimens during the acute phase of infection. The lowest concentration of SARS-CoV-2 viral copies this assay can detect is 138 copies/mL. A negative result does not preclude SARS-Cov-2 infection and should not be used as the sole basis for treatment or other patient management decisions. A negative result may occur with  improper specimen collection/handling, submission of specimen other than nasopharyngeal swab, presence of viral mutation(s) within the areas targeted by this assay, and inadequate number of viral copies(<138 copies/mL). A negative result must be combined with clinical observations, patient history, and epidemiological information. The expected result is  Negative.  Fact Sheet for Patients:  EntrepreneurPulse.com.au  Fact Sheet for Healthcare Providers:  IncredibleEmployment.be  This test is no t yet approved or cleared by the [place] FDA and  has been authorized for detection and/or diagnosis of SARS-CoV-2 by FDA under an Emergency Use Authorization (EUA). This EUA will remain  in effect (meaning this test can be used) for the duration of the COVID-19 declaration under Section 564(b)(1) of the Act, 21 U.S.C.section 360bbb-3(b)(1), unless the authorization is terminated  or revoked sooner.       Influenza A by PCR NEGATIVE NEGATIVE Final   Influenza B by PCR NEGATIVE NEGATIVE Final    Comment: (NOTE) The Xpert Xpress SARS-CoV-2/FLU/RSV plus assay is intended as an aid in the diagnosis of influenza from Nasopharyngeal swab specimens and should not be used as a sole basis for treatment. Nasal washings and aspirates are unacceptable for Xpert Xpress SARS-CoV-2/FLU/RSV testing.  Fact Sheet for Patients: EntrepreneurPulse.com.au  Fact Sheet for Healthcare Providers: IncredibleEmployment.be  This test is not yet approved or cleared by the [place] FDA and has been authorized for detection and/or diagnosis of SARS-CoV-2 by FDA under an Emergency Use  Authorization (EUA). This EUA will remain in effect (meaning this test can be used) for the duration of the COVID-19 declaration under Section 564(b)(1) of the Act, 21 U.S.C. section 360bbb-3(b)(1), unless the authorization is terminated or revoked.  Performed at [facility], [address] [place], [place] [address]          Radiology Studies: DG Chest 2 View  Result Date: 10/29/2020 CLINICAL DATA:  Shortness of breath EXAM: CHEST - 2 VIEW COMPARISON:  10/08/2020 FINDINGS: The heart size and mediastinal contours are within normal limits. Both lungs are clear. Status post bilateral  reverse shoulder arthroplasty. Multiple chronic, callused fractures of the right ribs. Disc degenerative disease of the thoracic spine. IMPRESSION: No acute abnormality of the lungs. Electronically Signed   By: Eddie Candle M.D.   On: 10/29/2020 14:39   DG Hip Unilat W or Wo Pelvis 2-3 Views Left  Result Date: 10/29/2020 CLINICAL DATA:  Left hip pain, does the EXAM: DG HIP (WITH OR WITHOUT PELVIS) 2-3V LEFT COMPARISON:  12/10/2011 left hip radiographs FINDINGS: Status post left total hip arthroplasty. Partially visualized right total hip arthroplasty. No evidence of hardware fracture or loosening. No pelvic fracture or diastasis. No left hip osseous fracture or dislocation. No focal osseous lesions. Bilateral posterior spinal fusion in the lower lumbar spine. IMPRESSION: No acute osseous abnormality. Bilateral total hip arthroplasty with no evidence of hardware complication. Electronically Signed   By: Ilona Sorrel M.D.   On: 10/29/2020 14:44        Scheduled Meds:  amLODipine  2.5 mg Oral Daily   azithromycin  500 mg Oral Daily   Followed by   Derrill Memo ON 10/31/2020] azithromycin  250 mg Oral Daily   budesonide  0.25 mg Nebulization BID   enoxaparin (LOVENOX) injection  40 mg Subcutaneous Q24H   escitalopram  10 mg Oral Daily   famotidine  20 mg Oral Daily   fluticasone  2 spray Each Nare Daily   gabapentin  600 mg Oral BID   guaiFENesin  1,200 mg Oral BID   ipratropium-albuterol  3 mL Nebulization QID   lamoTRIgine  100 mg Oral BID   leflunomide  20 mg Oral Daily   methylPREDNISolone (SOLU-MEDROL) injection  40 mg Intravenous Q12H   mirabegron ER  50 mg Oral QHS   montelukast  10 mg Oral Daily   pantoprazole  40 mg Oral Daily   pravastatin  20 mg Oral q1800   QUEtiapine  25 mg Oral QHS   sucralfate  1 g Oral BID   Continuous Infusions:  sodium chloride 100 mL/hr at 10/30/20 0840     LOS: 1 day    Time spent: 55mins    Kathie Dike, MD [facility]   If 7PM-7AM,  please contact night-coverage [email or web address]  10/30/2020, 11:29 AM

## 2020-10-30 NOTE — ED Notes (Addendum)
IV accidentally removed by pt. IV access unsuccessful by this RN. IV team order placed.  New chux placed. Gown changed. Dinner tray at bedside. Purewick in place

## 2020-10-31 ENCOUNTER — Encounter: Payer: Self-pay | Admitting: Pulmonary Disease

## 2020-10-31 ENCOUNTER — Other Ambulatory Visit: Payer: Medicare PPO

## 2020-10-31 ENCOUNTER — Ambulatory Visit: Payer: Medicare PPO | Admitting: Pulmonary Disease

## 2020-10-31 LAB — GLUCOSE, CAPILLARY
Glucose-Capillary: 148 mg/dL — ABNORMAL HIGH (ref 70–99)
Glucose-Capillary: 202 mg/dL — ABNORMAL HIGH (ref 70–99)
Glucose-Capillary: 155 mg/dL — ABNORMAL HIGH (ref 70–99)
Glucose-Capillary: 173 mg/dL — ABNORMAL HIGH (ref 70–99)
Glucose-Capillary: 176 mg/dL — ABNORMAL HIGH (ref 70–99)

## 2020-10-31 MED ORDER — HYDROCODONE-ACETAMINOPHEN 5-325 MG PO TABS
1.0000 | ORAL_TABLET | Freq: Four times a day (QID) | ORAL | Status: DC | PRN
Start: 2020-10-31 — End: 2020-11-04
  Administered 2020-10-31 – 2020-11-04 (×9): 1 via ORAL
  Filled 2020-10-31 (×9): qty 1

## 2020-10-31 MED ORDER — IPRATROPIUM-ALBUTEROL 0.5-2.5 (3) MG/3ML IN SOLN
3.0000 mL | Freq: Three times a day (TID) | RESPIRATORY_TRACT | Status: DC
  Administered 2020-10-31 – 2020-11-04 (×11): 3 mL via RESPIRATORY_TRACT
  Filled 2020-10-31 (×12): qty 3

## 2020-10-31 MED ORDER — PREDNISONE 20 MG PO TABS
40.0000 mg | ORAL_TABLET | Freq: Every day | ORAL | Status: DC
  Administered 2020-11-01 – 2020-11-04 (×4): 40 mg via ORAL
  Filled 2020-10-31 (×4): qty 2

## 2020-10-31 NOTE — NC FL2 (Signed)
[place] LEVEL OF CARE SCREENING TOOL     IDENTIFICATION  Patient Name: Kerri Carter Birthdate: 08/10/1940 Sex: female Admission Date (Current Location): 10/29/2020  [place] and [place] Number:  Engineering geologist and Address:  [facility], [address] [place], [place] [address]      Provider Number: [id number]  Attending Physician Name and Address:  Kathie Dike, MD  Relative Name and Phone Number:  Marlou Sa, son [phone]    Current Level of Care: Hospital Recommended Level of Care: [place] Prior Approval Number:    Date Approved/Denied:   PASRR Number: [id number]  Discharge Plan: SNF    Current Diagnoses: Patient Active Problem List   Diagnosis Date Noted   COPD exacerbation ([place]) 10/29/2020   Acromial process of scapula fracture 12/21/2019   NSIP (nonspecific interstitial pneumonitis) ([place]) 12/08/2019   Iliopsoas bursitis of right hip 08/29/2019   Chronic left shoulder pain 08/29/2019   Overweight (BMI 25.0-29.9) 08/29/2019   Hypertriglyceridemia 07/24/2019   Bipolar disorder, in full remission, most recent episode depressed ([place]) 07/13/2019   Essential hypertension 06/16/2019   Chronic pain 06/16/2019   Lymphedema 06/05/2019   Chronic venous insufficiency 05/25/2019   PAD (peripheral artery disease) ([place]) 05/25/2019   Leg edema 05/02/2019   Episodic mood disorder ([place]) 12/45/8099   Complicated grief 83/38/2505   At high risk for falls 03/16/2019   Pelvic mass in female 03/16/2019   Compression fracture of L4 vertebra ([place]) 02/21/2019   Collapsed vertebra, not elsewhere classified, lumbar region, initial encounter for fracture ([place]) 02/21/2019   Anxiety 01/20/2019   Pain due to onychomycosis of toenails of both feet 12/05/2018   Hav (hallux abducto valgus), unspecified laterality 12/05/2018   Closed fracture of acromial process of scapula 10/28/2018   Status post reverse arthroplasty of  shoulder, left 07/26/2018   Interstitial pulmonary disease ([place]) 07/07/2018   Fatigue 07/07/2018   Avascular necrosis of left shoulder due to adverse effect of steroid therapy ([place]) 01/20/2018   Other shoulder lesions, left shoulder 12/20/2017   Status post reverse total shoulder replacement, right 11/04/2017   Leukocytosis 10/19/2017   Allergic rhinitis 09/28/2017   Rotator cuff tendinitis, right 07/26/2017   Strain of right hip 02/26/2017   History of fracture of left hip 01/15/2017   Status post lumbar spinal fusion 01/15/2017   Dislocation of hip joint prosthesis ([place]) 01/08/2017   Status post hip hemiarthroplasty 01/08/2017   Leg swelling 12/15/2016   Age-related osteoporosis without current pathological fracture 11/05/2016   Hip fracture ([place]) 10/31/2016   Polyneuropathy 10/28/2016   Left foot pain 10/19/2016   Spinal stenosis of lumbar region 09/15/2016   Osteoporosis 08/27/2016   Drug-induced osteoporosis 08/27/2016   Adnexal mass 08/25/2016   Prediabetes 08/25/2016   Coronary atherosclerosis 08/25/2016   History of syncope 08/25/2016   Hypokalemia 08/25/2016   Mixed bipolar I disorder ([place]) 10/09/2015   Stage 3b chronic kidney disease ([place])    Conversion disorder 08/04/2015   Hyperlipidemia 07/17/2015   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   Macular degeneration 07/17/2015   Insomnia 07/17/2015   Rheumatoid arthritis ([place]) 12/05/2014   Other specified postprocedural states 07/20/2013   Anxiety and depression 06/13/2013   Osteoarthritis of knee 06/07/2013   Cough 05/02/2012   Valvular heart disease 10/13/2011   Asthma-COPD overlap syndrome ([place]) 09/09/2011   OSA (obstructive sleep apnea) 09/09/2011   Nocturnal hypoxemia 09/09/2011   Gastroesophageal reflux disease 02/24/2011   Single kidney 02/24/2011  H/O cardiac catheterization 02/24/2011   Renal artery stenosis (HCC) 02/24/2011    Orientation RESPIRATION BLADDER Height & Weight      Self, Time, Situation, Place  Normal Continent Weight: 83 kg Height:  5\' 1"  (154.9 cm)  BEHAVIORAL SYMPTOMS/MOOD NEUROLOGICAL BOWEL NUTRITION STATUS      Continent Diet (regular)  AMBULATORY STATUS COMMUNICATION OF NEEDS Skin   Extensive Assist Verbally Normal                       Personal Care Assistance Level of Assistance  Bathing, Dressing Bathing Assistance: Limited assistance   Dressing Assistance: Limited assistance     Functional Limitations Info             SPECIAL CARE FACTORS FREQUENCY  PT (By licensed PT)     PT Frequency: 5 times per week              Contractures Contractures Info: Not present    Additional Factors Info  Code Status, Allergies Code Status Info: Full Allergies Info: Ceftin (Cefuroxime Axetil), Lisinopril, Sulfa Antibiotics, Sulfasalazine, Morphine, Xarelto (Rivaroxaban), Adhesive (Tape), Antihistamines, Chlorpheniramine-type, Antivert (Meclizine Hcl), Aspirin, Contrast Media (Iodinated Diagnostic Agents), Decongestant (Pseudoephedrine Hcl), Doxycycline, Levaquin (Levofloxacin In D5w), Polymyxin B, Tetanus Toxoids           Current Medications (10/31/2020):  This is the current hospital active medication list Current Facility-Administered Medications  Medication Dose Route Frequency Provider Last Rate Last Admin   0.9 %  sodium chloride infusion   Intravenous Continuous Mansy, Jan A, MD 100 mL/hr at 10/31/20 0638 Infusion Verify at 10/31/20 9024   acetaminophen (TYLENOL) tablet 650 mg  650 mg Oral Q6H PRN Mansy, Jan A, MD       Or   acetaminophen (TYLENOL) suppository 650 mg  650 mg Rectal Q6H PRN Mansy, Jan A, MD       ALPRAZolam Duanne Moron) tablet 0.25 mg  0.25 mg Oral QHS PRN Mansy, Jan A, MD   0.25 mg at 10/30/20 2144   amLODipine (NORVASC) tablet 2.5 mg  2.5 mg Oral Daily Mansy, Jan A, MD   2.5 mg at 10/31/20 0939   azithromycin (ZITHROMAX) tablet 250 mg  250 mg Oral Daily Kathie Dike, MD   250 mg at 10/31/20 0946    budesonide (PULMICORT) nebulizer solution 0.25 mg  0.25 mg Nebulization BID Mansy, Jan A, MD   0.25 mg at 10/31/20 0730   cyclobenzaprine (FLEXERIL) tablet 10 mg  10 mg Oral TID PRN Mansy, Jan A, MD   10 mg at 10/31/20 0749   dicyclomine (BENTYL) capsule 10 mg  10 mg Oral BID PRN Mansy, Jan A, MD       enoxaparin (LOVENOX) injection 40 mg  40 mg Subcutaneous Q24H Mansy, Jan A, MD       escitalopram (LEXAPRO) tablet 10 mg  10 mg Oral Daily Mansy, Jan A, MD   10 mg at 10/31/20 0945   famotidine (PEPCID) tablet 20 mg  20 mg Oral Daily Mansy, Jan A, MD   20 mg at 10/31/20 0946   fluticasone (FLONASE) 50 MCG/ACT nasal spray 2 spray  2 spray Each Nare Daily Mansy, Jan A, MD   2 spray at 10/31/20 1151   gabapentin (NEURONTIN) capsule 600 mg  600 mg Oral BID Mansy, Jan A, MD   600 mg at 10/31/20 0946   guaiFENesin (MUCINEX) 12 hr tablet 1,200 mg  1,200 mg Oral BID Kathie Dike, MD   1,200 mg at  10/31/20 0945   HYDROcodone-acetaminophen (NORCO/VICODIN) 5-325 MG per tablet 1 tablet  1 tablet Oral Q6H PRN Kathie Dike, MD   1 tablet at 10/31/20 0945   insulin aspart (novoLOG) injection 0-15 Units  0-15 Units Subcutaneous TID WC Kathie Dike, MD   5 Units at 10/31/20 1151   insulin aspart (novoLOG) injection 0-5 Units  0-5 Units Subcutaneous QHS Kathie Dike, MD       ipratropium-albuterol (DUONEB) 0.5-2.5 (3) MG/3ML nebulizer solution 3 mL  3 mL Nebulization TID Kathie Dike, MD       lamoTRIgine (LAMICTAL) tablet 100 mg  100 mg Oral BID Mansy, Jan A, MD   100 mg at 10/31/20 0945   leflunomide (ARAVA) tablet 20 mg  20 mg Oral Daily Mansy, Jan A, MD   20 mg at 10/31/20 0945   magnesium hydroxide (MILK OF MAGNESIA) suspension 30 mL  30 mL Oral Daily PRN Mansy, Jan A, MD       methylPREDNISolone sodium succinate (SOLU-MEDROL) 125 mg/2 mL injection 80 mg  80 mg Intravenous Daily Kathie Dike, MD   80 mg at 10/31/20 0938   mirabegron ER (MYRBETRIQ) tablet 50 mg  50 mg Oral QHS Mansy, Jan A, MD    50 mg at 10/30/20 0014   montelukast (SINGULAIR) tablet 10 mg  10 mg Oral Daily Mansy, Jan A, MD   10 mg at 10/31/20 0946   ondansetron (ZOFRAN) tablet 4 mg  4 mg Oral Q6H PRN Mansy, Jan A, MD       Or   ondansetron [facility]) injection 4 mg  4 mg Intravenous Q6H PRN Mansy, Jan A, MD       pantoprazole (PROTONIX) EC tablet 40 mg  40 mg Oral Daily Mansy, Jan A, MD   40 mg at 10/31/20 0945   pravastatin (PRAVACHOL) tablet 20 mg  20 mg Oral q1800 Mansy, Jan A, MD   20 mg at 10/30/20 1716   QUEtiapine (SEROQUEL) tablet 25 mg  25 mg Oral QHS Mansy, Jan A, MD   25 mg at 10/30/20 2136   sucralfate (CARAFATE) tablet 1 g  1 g Oral BID Mansy, Jan A, MD   1 g at 10/31/20 0946   traZODone (DESYREL) tablet 25 mg  25 mg Oral QHS PRN Mansy, Jan A, MD   25 mg at 10/30/20 2145     Discharge Medications: Please see discharge summary for a list of discharge medications.  Relevant Imaging Results:  Relevant Lab Results:   Additional Information SS# [id number]  Su Hilt, RN

## 2020-10-31 NOTE — Progress Notes (Signed)
Physical Therapy Treatment Patient Details Name: Kerri Carter MRN: [id number] DOB: 1940/06/26 Today's Date: 10/31/2020   History of Present Illness Kerri Carter is a 80 y.o. female with medical history significant for asthma, stage III chronic kidney disease, COPD, hypertension, GERD, interstitial lung disease, PAD and PVD, who presented to the ER with acute onset of worsening dyspnea with associated cough productive of yellowish sputum as well as wheezing over the last few days. She has left lower leg pain as well as sciatic pain with plan for operative intervention by Dr. Cari Caraway in October.    PT Comments    Pt seen again today per request of care team to assess ambulation and oxygen requirements with exertion. Pt able to stand from recliner chair with CGA + RW. Pt able to ambulate 4 ft prior to reporting severe L sciatic pain and requesting to return to chair. Pt was received on room air and at 91% SpO2. Unable to assess oxygen requirements with exertion at this time. Pt would benefit from SNF placement due to her current activity limitations and lack of caregiver support at home. Care team notified of update via Secure Chat. Pt will continue to benefit from skilled PT services to improve mobility and return to prior level of function.    Recommendations for follow up therapy are one component of a multi-disciplinary discharge planning process, led by the attending physician.  Recommendations may be updated based on patient status, additional functional criteria and insurance authorization.  Follow Up Recommendations  SNF     Equipment Recommendations  None recommended by PT    Recommendations for Other Services       Precautions / Restrictions Precautions Precautions: Fall Restrictions Weight Bearing Restrictions: No     Mobility  Bed Mobility Overal bed mobility: Needs Assistance Bed Mobility: Sit to Supine     Supine to sit: Supervision     General bed mobility  comments: Able to manage B LEs back into bed without physical assistance    Transfers Overall transfer level: Needs assistance Equipment used: Rolling walker (2 wheeled) Transfers: Sit to/from Stand Sit to Stand: Min guard         General transfer comment: CGA for standing from recliner chair with usage of B UEs. No LOB noted  Ambulation/Gait Ambulation/Gait assistance: Min guard Gait Distance (Feet): 4 Feet Assistive device: Rolling walker (2 wheeled) Gait Pattern/deviations: Step-to pattern;Antalgic     General Gait Details: Attempted ambulation again and patient unable to continue ambulating due to increased L sciatic pain.   Stairs             Wheelchair Mobility    Modified Rankin (Stroke Patients Only)       Balance Overall balance assessment: Needs assistance Sitting-balance support: Feet supported;No upper extremity supported Sitting balance-Leahy Scale: Good     Standing balance support: Bilateral upper extremity supported;During functional activity Standing balance-Leahy Scale: Fair Standing balance comment: No LOB noted with B UE assistance on RW                            Cognition Arousal/Alertness: Awake/alert Behavior During Therapy: WFL for tasks assessed/performed Overall Cognitive Status: Within Functional Limits for tasks assessed                                 General Comments: Demonstrating some decreased insight to her deficits and  assistance that may be required at discharge      Exercises Other Exercises Other Exercises: Pt assisted back to chair at end of ambulation and then requested transfer back to bed. Pt completed stand step transfer from recliner to chair with CGA and verbal cues. Pt completed bed mobility independently back into bed. Pt left positioned with pillow under her L LE for comfort.    General Comments        Pertinent Vitals/Pain Pain Assessment: Faces Faces Pain Scale: Hurts whole  lot Pain Location: Posterior L thigh Pain Descriptors / Indicators: Guarding;Grimacing Pain Intervention(s): Limited activity within patient's tolerance;Monitored during session;Premedicated before session;Repositioned    Home Living                      Prior Function            PT Goals (current goals can now be found in the care plan section) Acute Rehab PT Goals Patient Stated Goal: to go home and improve L LE pain PT Goal Formulation: With patient Time For Goal Achievement: 11/14/20 Potential to Achieve Goals: Good Progress towards PT goals: Progressing toward goals    Frequency    Min 2X/week      PT Plan Discharge plan needs to be updated    Co-evaluation              AM-PAC PT "6 Clicks" Mobility   Outcome Measure  Help needed turning from your back to your side while in a flat bed without using bedrails?: A Little Help needed moving from lying on your back to sitting on the side of a flat bed without using bedrails?: A Little Help needed moving to and from a bed to a chair (including a wheelchair)?: A Little Help needed standing up from a chair using your arms (e.g., wheelchair or bedside chair)?: A Little Help needed to walk in hospital room?: A Lot Help needed climbing 3-5 steps with a railing? : Total 6 Click Score: 15    End of Session Equipment Utilized During Treatment: Gait belt Activity Tolerance: Patient limited by pain Patient left: in bed;with call bell/phone within reach;with bed alarm set   PT Visit Diagnosis: Unsteadiness on feet (R26.81);Other abnormalities of gait and mobility (R26.89);Muscle weakness (generalized) (M62.81)     Time: 3419-3790 PT Time Calculation (min) (ACUTE ONLY): 24 min  Charges:  $Gait Training: 8-22 mins $Therapeutic Activity: 8-22 mins                     Andrey Campanile, SPT    Andrey Campanile 10/31/2020, 4:08 PM

## 2020-10-31 NOTE — Progress Notes (Signed)
PROGRESS NOTE    Kerri Carter  DGU:[id number] DOB: 03/07/1940 DOA: 10/29/2020 PCP: McLean-Scocuzza, Nino Glow, MD    Brief Narrative:  80 year old female with a history of asthma/COPD, chronic kidney disease stage III, hypertension, interstitial lung disease, presents to the hospital worsening shortness of breath.  She was found to have possible COPD exacerbation and started on intravenous steroids, bronchodilators and antibiotics.  Imaging of chest did not show any evidence of pneumonia.   Assessment & Plan:   Active Problems:   COPD exacerbation (HCC)   COPD exacerbation -Patient has a history of prednisone dependent COPD -She uses oxygen at night and during the day as needed -She is normally able to ambulate around her home without becoming short of breath -She was started on intravenous steroids and appears to be showing improvement -We will transition to prednisone -Continue on bronchodilators, mucolytic's and inhaled steroids -She is on a course of azithromycin -She is followed by Dr. Patsey Berthold, pulmonologist in the outpatient setting  GERD -Continue PPI  Hypertension -Continued on amlodipine  Depression -She is continued on Lexapro and Seroquel  Rheumatoid arthritis -Continue on leflunomide  Anxiety -Continued on Xanax  Generalized weakness -Seen by physical therapy with recommendations for skilled nursing facility placement   DVT prophylaxis: enoxaparin (LOVENOX) injection 40 mg Start: 10/29/20 2245  Code Status: Full code Family Communication: Discussed with patient Disposition Plan: Status is: Inpatient  Remains inpatient appropriate because:IV treatments appropriate due to intensity of illness or inability to take PO and Inpatient level of care appropriate due to severity of illness  Dispo: The patient is from: Home              Anticipated d/c is to: SNF              Patient currently is medically stable to d/c.   Difficult to place patient  No    Consultants:    Procedures:    Antimicrobials:  Azithromycin 9/28>   Subjective: Currently, she is sitting up in the chair.  She still feels short of breath.,  Currently on room air  Objective: Vitals:   10/31/20 0323 10/31/20 0730 10/31/20 0838 10/31/20 1603  BP: 122/62  118/64 (!) 143/64  Pulse: 90  93 (!) 103  Resp: 16  18 18   Temp: 97.8 F (36.6 C)  97.6 F (36.4 C) 98 F (36.7 C)  TempSrc:   Oral   SpO2: 96% 96% 96% 93%  Weight:      Height:        Intake/Output Summary (Last 24 hours) at 10/31/2020 2017 Last data filed at 10/31/2020 1713 Gross per 24 hour  Intake 2293.79 ml  Output 800 ml  Net 1493.79 ml   Filed Weights   10/29/20 1312  Weight: 83 kg    Examination:  General exam: Appears calm and comfortable  Respiratory system: Improved air entry bilaterally with minimal wheeze, respiratory effort normal. Cardiovascular system: S1 & S2 heard, RRR. No JVD, murmurs, rubs, gallops or clicks. No pedal edema. Gastrointestinal system: Abdomen is nondistended, soft and nontender. No organomegaly or masses felt. Normal bowel sounds heard. Central nervous system: Alert and oriented. No focal neurological deficits. Extremities: Symmetric 5 x 5 power. Skin: No rashes, lesions or ulcers Psychiatry: Judgement and insight appear normal. Mood & affect appropriate.     Data Reviewed: I have personally reviewed following labs and imaging studies  CBC: Recent Labs  Lab 10/29/20 1322 10/30/20 0635  WBC 12.2* 8.1  NEUTROABS 7.7  --  HGB 11.8* 11.4*  HCT 37.1 36.4  MCV 90.9 89.9  PLT 222 710   Basic Metabolic Panel: Recent Labs  Lab 10/29/20 1322 10/30/20 0635  NA 139 141  K 4.5 4.1  CL 102 103  CO2 26 28  GLUCOSE 146* 207*  BUN 15 15  CREATININE 1.24* 1.21*  CALCIUM 8.4* 8.5*   GFR: Estimated Creatinine Clearance: 37.4 mL/min (A) (by C-G formula based on SCr of 1.21 mg/dL (H)). Liver Function Tests: Recent Labs  Lab 10/29/20 1322   AST 20  ALT 16  ALKPHOS 77  BILITOT 0.6  PROT 6.7  ALBUMIN 3.4*   No results for input(s): LIPASE, AMYLASE in the last 168 hours. No results for input(s): AMMONIA in the last 168 hours. Coagulation Profile: No results for input(s): INR, PROTIME in the last 168 hours. Cardiac Enzymes: No results for input(s): CKTOTAL, CKMB, CKMBINDEX, TROPONINI in the last 168 hours. BNP (last 3 results) No results for input(s): PROBNP in the last 8760 hours. HbA1C: Recent Labs    10/30/20 0636  HGBA1C 6.1*   CBG: Recent Labs  Lab 10/30/20 1642 10/30/20 2125 10/31/20 0736 10/31/20 1128 10/31/20 1609  GLUCAP 196* 172* 148* 202* 155*   Lipid Profile: No results for input(s): CHOL, HDL, LDLCALC, TRIG, CHOLHDL, LDLDIRECT in the last 72 hours. Thyroid Function Tests: No results for input(s): TSH, T4TOTAL, FREET4, T3FREE, THYROIDAB in the last 72 hours. Anemia Panel: No results for input(s): VITAMINB12, FOLATE, FERRITIN, TIBC, IRON, RETICCTPCT in the last 72 hours. Sepsis Labs: No results for input(s): PROCALCITON, LATICACIDVEN in the last 168 hours.  Recent Results (from the past 240 hour(s))  Resp Panel by RT-PCR (Flu A&B, Covid) Nasopharyngeal Swab     Status: None   Collection Time: 10/29/20  1:22 PM   Specimen: Nasopharyngeal Swab; Nasopharyngeal(NP) swabs in vial transport medium  Result Value Ref Range Status   SARS Coronavirus 2 by RT PCR NEGATIVE NEGATIVE Final    Comment: (NOTE) SARS-CoV-2 target nucleic acids are NOT DETECTED.  The SARS-CoV-2 RNA is generally detectable in upper respiratory specimens during the acute phase of infection. The lowest concentration of SARS-CoV-2 viral copies this assay can detect is 138 copies/mL. A negative result does not preclude SARS-Cov-2 infection and should not be used as the sole basis for treatment or other patient management decisions. A negative result may occur with  improper specimen collection/handling, submission of specimen  other than nasopharyngeal swab, presence of viral mutation(s) within the areas targeted by this assay, and inadequate number of viral copies(<138 copies/mL). A negative result must be combined with clinical observations, patient history, and epidemiological information. The expected result is Negative.  Fact Sheet for Patients:  EntrepreneurPulse.com.au  Fact Sheet for Healthcare Providers:  IncredibleEmployment.be  This test is no t yet approved or cleared by the [place] FDA and  has been authorized for detection and/or diagnosis of SARS-CoV-2 by FDA under an Emergency Use Authorization (EUA). This EUA will remain  in effect (meaning this test can be used) for the duration of the COVID-19 declaration under Section 564(b)(1) of the Act, 21 U.S.C.section 360bbb-3(b)(1), unless the authorization is terminated  or revoked sooner.       Influenza A by PCR NEGATIVE NEGATIVE Final   Influenza B by PCR NEGATIVE NEGATIVE Final    Comment: (NOTE) The Xpert Xpress SARS-CoV-2/FLU/RSV plus assay is intended as an aid in the diagnosis of influenza from Nasopharyngeal swab specimens and should not be used as a sole basis for treatment.  Nasal washings and aspirates are unacceptable for Xpert Xpress SARS-CoV-2/FLU/RSV testing.  Fact Sheet for Patients: EntrepreneurPulse.com.au  Fact Sheet for Healthcare Providers: IncredibleEmployment.be  This test is not yet approved or cleared by the [place] FDA and has been authorized for detection and/or diagnosis of SARS-CoV-2 by FDA under an Emergency Use Authorization (EUA). This EUA will remain in effect (meaning this test can be used) for the duration of the COVID-19 declaration under Section 564(b)(1) of the Act, 21 U.S.C. section 360bbb-3(b)(1), unless the authorization is terminated or revoked.  Performed at [facility], [address]  [place], [place] [address]          Radiology Studies: No results found.      Scheduled Meds:  amLODipine  2.5 mg Oral Daily   azithromycin  250 mg Oral Daily   budesonide  0.25 mg Nebulization BID   enoxaparin (LOVENOX) injection  40 mg Subcutaneous Q24H   escitalopram  10 mg Oral Daily   famotidine  20 mg Oral Daily   fluticasone  2 spray Each Nare Daily   gabapentin  600 mg Oral BID   guaiFENesin  1,200 mg Oral BID   insulin aspart  0-15 Units Subcutaneous TID WC   insulin aspart  0-5 Units Subcutaneous QHS   ipratropium-albuterol  3 mL Nebulization TID   lamoTRIgine  100 mg Oral BID   leflunomide  20 mg Oral Daily   mirabegron ER  50 mg Oral QHS   montelukast  10 mg Oral Daily   pantoprazole  40 mg Oral Daily   pravastatin  20 mg Oral q1800   [START ON 11/01/2020] predniSONE  40 mg Oral Q breakfast   QUEtiapine  25 mg Oral QHS   sucralfate  1 g Oral BID   Continuous Infusions:  sodium chloride 100 mL/hr at 10/31/20 0638     LOS: 2 days    Time spent: 28mins    Kathie Dike, MD [facility]   If 7PM-7AM, please contact night-coverage [email or web address]  10/31/2020, 8:17 PM

## 2020-10-31 NOTE — Progress Notes (Signed)
Met with the patient to discuss DC plan and needs She has All DME at home and does not need additional, She is already open with [facility] for [facility] PT, will continue, I notified [facility] She will apply for Medicaid and try to get a PCA once approved, she stated that she has applied before but had to many assets, she has spent down and will reapply,  She will have her grandson move a Recliner into her bedroom so she does not have to walk so far, she has a BSC already and hand rails, she has transportation to the doctor and can afford her meds

## 2020-10-31 NOTE — Evaluation (Signed)
Physical Therapy Evaluation Patient Details Name: Kerri Carter MRN: [id number] DOB: [birth year] Today's Date: 10/31/2020  History of Present Illness  Kerri Carter is a 80 y.o. female with medical history significant for asthma, stage III chronic kidney disease, COPD, hypertension, GERD, interstitial lung disease, PAD and PVD, who presented to the ER with acute onset of worsening dyspnea with associated cough productive of yellowish sputum as well as wheezing over the last few days. She has left lower leg pain as well as sciatic pain with plan for operative intervention by Dr. Cari Caraway in October.   Clinical Impression  Pt is a pleasant 80 year old female who presents to PT evaluation after admission for COPD exacerbation. Pt reports being independent with usage of RW at baseline for household distances. Pt reports she uses 3L of oxygen at night time and none throughout the day. Currently, pt able to complete bed mobility with SPV, sit to stand and transfer from bed to chair with CGA with usage of RW. Pt oxygen monitored throughout session and sitting EOB without O2 patient at 87% and recovered to 90% with cues for pursed lip breathing. After transfer, pt dipped back down to 86% and replaced with 1L of oxygen and maintained >93%. Pt mainly limited by L LE pain. Recommending patient return home with HHPT and uses RW for additional safety for ambulation. Pt will continue to benefit from skilled PT services to improve independence and reduce likelihood of falls.     Recommendations for follow up therapy are one component of a multi-disciplinary discharge planning process, led by the attending physician.  Recommendations may be updated based on patient status, additional functional criteria and insurance authorization.  Follow Up Recommendations Home health PT;Supervision for mobility/OOB    Equipment Recommendations  None recommended by PT    Recommendations for Other Services       Precautions  / Restrictions Precautions Precautions: Fall Restrictions Weight Bearing Restrictions: No      Mobility  Bed Mobility Overal bed mobility: Needs Assistance Bed Mobility: Supine to Sit     Supine to sit: Supervision     General bed mobility comments: Verbal cues for encouragement    Transfers Overall transfer level: Needs assistance Equipment used: Rolling walker (2 wheeled) Transfers: Sit to/from Stand Sit to Stand: Min guard         General transfer comment: CGA from slightly elevated surface due to incraesed L posterior thigh pain. Pt demonstrating heavy UE usage of RW due to pain  Ambulation/Gait Ambulation/Gait assistance: Min guard Gait Distance (Feet): 1 Feet Assistive device: Rolling walker (2 wheeled) Gait Pattern/deviations: Step-to pattern;Antalgic     General Gait Details: 1 step towards recliner chair with reports of increased pain and requesting to sit  Stairs            Wheelchair Mobility    Modified Rankin (Stroke Patients Only)       Balance Overall balance assessment: Needs assistance Sitting-balance support: Feet supported;No upper extremity supported Sitting balance-Leahy Scale: Good     Standing balance support: Bilateral upper extremity supported;During functional activity Standing balance-Leahy Scale: Fair            Pertinent Vitals/Pain Pain Assessment: Faces Faces Pain Scale: Hurts even more Pain Location: Posterior L thigh Pain Descriptors / Indicators: Guarding;Grimacing Pain Intervention(s): Limited activity within patient's tolerance;Monitored during session;Repositioned;Utilized relaxation techniques    Home Living Family/patient expects to be discharged to:: Private residence Living Arrangements: Other relatives (Lives with granddaughter and grandson) Available  Help at Discharge: Family;Available PRN/intermittently Type of Home: House Home Access: Ramped entrance     Home Layout: One level   Additional  Comments: Grandchildren only available in the evening and granddaughter also caring for young child    Prior Function Level of Independence: Independent with assistive device(s)         Comments: Pt reporting that she was IND prior to admission with usage of RW.     Hand Dominance        Extremity/Trunk Assessment   Upper Extremity Assessment Upper Extremity Assessment: Defer to OT evaluation    Lower Extremity Assessment Lower Extremity Assessment: LLE deficits/detail LLE Deficits / Details: Decreased full knee extension in sitting due to increased sciatic/posterior thigh pain. LLE Sensation: history of peripheral neuropathy       Communication   Communication: No difficulties  Cognition Arousal/Alertness: Awake/alert Behavior During Therapy: WFL for tasks assessed/performed Overall Cognitive Status: Within Functional Limits for tasks assessed            General Comments: A&O x 4; Demonstrating some anxiety regarding movement due to L sciatica      General Comments      Exercises     Assessment/Plan    PT Assessment Patient needs continued PT services  PT Problem List Decreased strength;Decreased range of motion;Decreased activity tolerance;Decreased mobility;Decreased knowledge of use of DME;Impaired sensation;Pain       PT Treatment Interventions DME instruction;Gait training;Stair training;Functional mobility training;Therapeutic activities;Therapeutic exercise;Balance training;Neuromuscular re-education;Cognitive remediation;Manual techniques;Modalities    PT Goals (Current goals can be found in the Care Plan section)  Acute Rehab PT Goals Patient Stated Goal: to go home and improve L LE pain PT Goal Formulation: With patient Time For Goal Achievement: 11/14/20 Potential to Achieve Goals: Good    Frequency Min 2X/week   Barriers to discharge Decreased caregiver support      Co-evaluation               AM-PAC PT "6 Clicks" Mobility   Outcome Measure Help needed turning from your back to your side while in a flat bed without using bedrails?: A Little Help needed moving from lying on your back to sitting on the side of a flat bed without using bedrails?: A Little Help needed moving to and from a bed to a chair (including a wheelchair)?: A Little Help needed standing up from a chair using your arms (e.g., wheelchair or bedside chair)?: A Little Help needed to walk in hospital room?: A Little Help needed climbing 3-5 steps with a railing? : A Lot 6 Click Score: 17    End of Session Equipment Utilized During Treatment: Gait belt;Oxygen Activity Tolerance: Patient limited by pain Patient left: in chair;with call bell/phone within reach;with chair alarm set Nurse Communication: Mobility status PT Visit Diagnosis: Unsteadiness on feet (R26.81);Other abnormalities of gait and mobility (R26.89);Muscle weakness (generalized) (M62.81)    Time: 4696-2952 PT Time Calculation (min) (ACUTE ONLY): 30 min   Charges:   PT Evaluation $PT Eval Low Complexity: 1 Low PT Treatments $Therapeutic Activity: 8-22 mins        Andrey Campanile, SPT   Andrey Campanile 10/31/2020, 2:11 PM

## 2020-10-31 NOTE — TOC Progression Note (Signed)
Transition of Care [facility]) - Progression Note    Patient Details  Name: TIASIA WEBERG MRN: [id number] Date of Birth: Feb 28, 1940  Transition of [facility]) CM/SW [place], RN Phone Number: 10/31/2020, 3:56 PM  Clinical Narrative:     Met with the patient again, she worked with PT again and only was able to walk a few feet, she would like to go to [facility] SN[facility] to [facility], I reached out to [facility] and requested to review for a bed, the patient has been there in the past  Expected Discharge Plan: [place] Barriers to Discharge: Continued Medical Work up  Expected Discharge Plan and Services Expected Discharge Plan: [place]   Discharge Planning Services: CM Consult   Living arrangements for the past 2 months: Single Family Home                 DME Arranged: N/A         HH Arranged: PT HH Agency: [place] Date [place]: 10/31/20 Time [place]: [id number] Representative spoke with at [place]: [place]   Social Determinants of Health ([place]) Interventions    Readmission Risk Interventions No flowsheet data found.

## 2020-11-01 ENCOUNTER — Inpatient Hospital Stay: Payer: Medicare PPO

## 2020-11-01 LAB — GLUCOSE, CAPILLARY
Glucose-Capillary: 132 mg/dL — ABNORMAL HIGH (ref 70–99)
Glucose-Capillary: 293 mg/dL — ABNORMAL HIGH (ref 70–99)
Glucose-Capillary: 260 mg/dL — ABNORMAL HIGH (ref 70–99)
Glucose-Capillary: 190 mg/dL — ABNORMAL HIGH (ref 70–99)

## 2020-11-01 NOTE — Progress Notes (Signed)
Physical Therapy Treatment Patient Details Name: Kerri Carter MRN: [id number] DOB: [birth year] Today's Date: 11/01/2020   History of Present Illness Kerri Carter is a 80 y.o. female with medical history significant for asthma, stage III chronic kidney disease, COPD, hypertension, GERD, interstitial lung disease, PAD and PVD, who presented to the ER with acute onset of worsening dyspnea with associated cough productive of yellowish sputum as well as wheezing over the last few days. She has left lower leg pain as well as sciatic pain with plan for operative intervention by Dr. Cari Caraway in October.    PT Comments    Pt ready for session.  To EOB with rail and supervision.  Stood to [employer] and min guard and cues.  Walks about 4 steps then sits abruptly in wheelchair without warning or reaching back (but anticipated by Probation officer).  She then stated she needs a BM and is assisted to bedside commode despite her asking for bedpan.  She has a large soft BM and care is provided.  Steps 2' to recliner.  She does not feel she can walk further at this time due fatigue, SOB on 3 lpm and LE pain. Pt remained in recliner after session.   Recommendations for follow up therapy are one component of a multi-disciplinary discharge planning process, led by the attending physician.  Recommendations may be updated based on patient status, additional functional criteria and insurance authorization.  Follow Up Recommendations  SNF     Equipment Recommendations  None recommended by PT    Recommendations for Other Services       Precautions / Restrictions Precautions Precautions: Fall Restrictions Weight Bearing Restrictions: No     Mobility  Bed Mobility Overal bed mobility: Needs Assistance Bed Mobility: Supine to Sit     Supine to sit: Supervision          Transfers Overall transfer level: Needs assistance Equipment used: Rolling walker (2 wheeled) Transfers: Sit to/from Stand Sit to Stand: Min  guard            Ambulation/Gait Ambulation/Gait assistance: Min guard;Min assist Gait Distance (Feet): 4 Feet Assistive device: Rolling walker (2 wheeled) Gait Pattern/deviations: Step-to pattern;Antalgic     General Gait Details: self selects gait distance and sits without warning in wheelchair behind her.  education and safety stressed.   Stairs             Wheelchair Mobility    Modified Rankin (Stroke Patients Only)       Balance Overall balance assessment: Needs assistance Sitting-balance support: Feet supported;No upper extremity supported Sitting balance-Leahy Scale: Good     Standing balance support: Bilateral upper extremity supported;During functional activity Standing balance-Leahy Scale: Fair Standing balance comment: No LOB noted with B UE assistance on RW                            Cognition Arousal/Alertness: Awake/alert Behavior During Therapy: WFL for tasks assessed/performed Overall Cognitive Status: Within Functional Limits for tasks assessed                                        Exercises Other Exercises Other Exercises: to commode for large soft BM and to void    General Comments        Pertinent Vitals/Pain Pain Assessment: Faces Faces Pain Scale: Hurts even more Pain Location: Posterior L  thigh Pain Descriptors / Indicators: Guarding;Grimacing Pain Intervention(s): Limited activity within patient's tolerance;Monitored during session;Repositioned    Home Living                      Prior Function            PT Goals (current goals can now be found in the care plan section) Progress towards PT goals: Progressing toward goals    Frequency    Min 2X/week      PT Plan Current plan remains appropriate    Co-evaluation              AM-PAC PT "6 Clicks" Mobility   Outcome Measure  Help needed turning from your back to your side while in a flat bed without using  bedrails?: A Little Help needed moving from lying on your back to sitting on the side of a flat bed without using bedrails?: A Little Help needed moving to and from a bed to a chair (including a wheelchair)?: A Little Help needed standing up from a chair using your arms (e.g., wheelchair or bedside chair)?: A Little Help needed to walk in hospital room?: A Lot Help needed climbing 3-5 steps with a railing? : Total 6 Click Score: 15    End of Session Equipment Utilized During Treatment: Gait belt Activity Tolerance: Patient limited by fatigue Patient left: in chair;with chair alarm set;with call bell/phone within reach;Other (comment) Nurse Communication: Mobility status PT Visit Diagnosis: Unsteadiness on feet (R26.81);Other abnormalities of gait and mobility (R26.89);Muscle weakness (generalized) (M62.81)     Time: 3903-0092 PT Time Calculation (min) (ACUTE ONLY): 23 min  Charges:  $Gait Training: 8-22 mins $Therapeutic Activity: 8-22 mins                    Chesley Noon, PTA 11/01/20, 11:09 AM

## 2020-11-01 NOTE — Progress Notes (Addendum)
PROGRESS NOTE    Kerri Carter  FKC:[id number] DOB: September 18, 1940 DOA: 10/29/2020 PCP: McLean-Scocuzza, Nino Glow, MD    Brief Narrative:  80 year old female with a history of asthma/COPD, chronic kidney disease stage III, hypertension, interstitial lung disease, presents to the hospital worsening shortness of breath.  She was found to have possible COPD exacerbation and started on intravenous steroids, bronchodilators and antibiotics.  Imaging of chest did not show any evidence of pneumonia.   Assessment & Plan:   Active Problems:   COPD exacerbation (HCC)   Asthma/COPD exacerbation -Patient has history of asthma/COPD overlap syndrome -Patient has a history of prednisone dependent COPD -She uses oxygen at night and during the day as needed -She is normally able to ambulate around her home without becoming short of breath -She was started on intravenous steroids and appears to be showing improvement -We will transition to prednisone -Continue on bronchodilators, mucolytic's and inhaled steroids -She is on a course of azithromycin -She is followed by Dr. Patsey Berthold, pulmonologist in the outpatient setting -Since she reported worsening shortness of breath overnight, will repeat chest x-ray and check BNP  Chronic respiratory failure with hypoxia -Uses oxygen at home, as needed during the day, and continuous at night. -Suspect that she may need to wear oxygen continuously during the day as well after discharge  GERD -Continue PPI  Hypertension -Continued on amlodipine  Depression -She is continued on Lexapro and Seroquel  Rheumatoid arthritis -Continue on leflunomide  Anxiety -Continued on Xanax  Chronic kidney disease stage IIIa -Creatinine currently at baseline -Continue to monitor  Chronic back pain -Followed by neurosurgery with plans for surgical intervention next month -This is likely having a significant impact on her ability to ambulate due to significant  pain -Continue pain management  Generalized weakness -Seen by physical therapy with recommendations for skilled nursing facility placement   DVT prophylaxis: enoxaparin (LOVENOX) injection 40 mg Start: 10/29/20 2245  Code Status: Full code Family Communication: Discussed with patient Disposition Plan: Status is: Inpatient  Remains inpatient appropriate because:IV treatments appropriate due to intensity of illness or inability to take PO and Inpatient level of care appropriate due to severity of illness  Dispo: The patient is from: Home              Anticipated d/c is to: SNF              Patient currently is medically stable to d/c.   Difficult to place patient No    Consultants:    Procedures:    Antimicrobials:  Azithromycin 9/28>   Subjective: She said she felt more short of breath overnight.  Difficulty expectorating sputum.  Objective: Vitals:   11/01/20 0506 11/01/20 1117 11/01/20 1434 11/01/20 1544  BP: 133/65 (!) 145/52  (!) 132/59  Pulse: 84 79 74 75  Resp: 18 17 18 16   Temp: 97.9 F (36.6 C) 97.6 F (36.4 C)  (!) 97.4 F (36.3 C)  TempSrc:    Oral  SpO2: 96% 97% 96% 97%  Weight:      Height:        Intake/Output Summary (Last 24 hours) at 11/01/2020 1818 Last data filed at 11/01/2020 1433 Gross per 24 hour  Intake 720 ml  Output --  Net 720 ml   Filed Weights   10/29/20 1312  Weight: 83 kg    Examination:  General exam: Appears calm and comfortable  Respiratory system: Improved air entry bilaterally with minimal wheeze, crackles at bases, respiratory effort normal. Cardiovascular  system: S1 & S2 heard, RRR. No JVD, murmurs, rubs, gallops or clicks. 1+pedal edema. Gastrointestinal system: Abdomen is nondistended, soft and nontender. No organomegaly or masses felt. Normal bowel sounds heard. Central nervous system: Alert and oriented. No focal neurological deficits. Extremities: Symmetric 5 x 5 power. Skin: No rashes, lesions or  ulcers Psychiatry: Judgement and insight appear normal. Mood & affect appropriate.     Data Reviewed: I have personally reviewed following labs and imaging studies  CBC: Recent Labs  Lab 10/29/20 1322 10/30/20 0635  WBC 12.2* 8.1  NEUTROABS 7.7  --   HGB 11.8* 11.4*  HCT 37.1 36.4  MCV 90.9 89.9  PLT 222 935   Basic Metabolic Panel: Recent Labs  Lab 10/29/20 1322 10/30/20 0635  NA 139 141  K 4.5 4.1  CL 102 103  CO2 26 28  GLUCOSE 146* 207*  BUN 15 15  CREATININE 1.24* 1.21*  CALCIUM 8.4* 8.5*   GFR: Estimated Creatinine Clearance: 37.4 mL/min (A) (by C-G formula based on SCr of 1.21 mg/dL (H)). Liver Function Tests: Recent Labs  Lab 10/29/20 1322  AST 20  ALT 16  ALKPHOS 77  BILITOT 0.6  PROT 6.7  ALBUMIN 3.4*   No results for input(s): LIPASE, AMYLASE in the last 168 hours. No results for input(s): AMMONIA in the last 168 hours. Coagulation Profile: No results for input(s): INR, PROTIME in the last 168 hours. Cardiac Enzymes: No results for input(s): CKTOTAL, CKMB, CKMBINDEX, TROPONINI in the last 168 hours. BNP (last 3 results) No results for input(s): PROBNP in the last 8760 hours. HbA1C: Recent Labs    10/30/20 0636  HGBA1C 6.1*   CBG: Recent Labs  Lab 10/31/20 2044 10/31/20 2101 11/01/20 0840 11/01/20 1150 11/01/20 1547  GLUCAP 173* 176* 132* 293* 260*   Lipid Profile: No results for input(s): CHOL, HDL, LDLCALC, TRIG, CHOLHDL, LDLDIRECT in the last 72 hours. Thyroid Function Tests: No results for input(s): TSH, T4TOTAL, FREET4, T3FREE, THYROIDAB in the last 72 hours. Anemia Panel: No results for input(s): VITAMINB12, FOLATE, FERRITIN, TIBC, IRON, RETICCTPCT in the last 72 hours. Sepsis Labs: No results for input(s): PROCALCITON, LATICACIDVEN in the last 168 hours.  Recent Results (from the past 240 hour(s))  Resp Panel by RT-PCR (Flu A&B, Covid) Nasopharyngeal Swab     Status: None   Collection Time: 10/29/20  1:22 PM    Specimen: Nasopharyngeal Swab; Nasopharyngeal(NP) swabs in vial transport medium  Result Value Ref Range Status   SARS Coronavirus 2 by RT PCR NEGATIVE NEGATIVE Final    Comment: (NOTE) SARS-CoV-2 target nucleic acids are NOT DETECTED.  The SARS-CoV-2 RNA is generally detectable in upper respiratory specimens during the acute phase of infection. The lowest concentration of SARS-CoV-2 viral copies this assay can detect is 138 copies/mL. A negative result does not preclude SARS-Cov-2 infection and should not be used as the sole basis for treatment or other patient management decisions. A negative result may occur with  improper specimen collection/handling, submission of specimen other than nasopharyngeal swab, presence of viral mutation(s) within the areas targeted by this assay, and inadequate number of viral copies(<138 copies/mL). A negative result must be combined with clinical observations, patient history, and epidemiological information. The expected result is Negative.  Fact Sheet for Patients:  EntrepreneurPulse.com.au  Fact Sheet for Healthcare Providers:  IncredibleEmployment.be  This test is no t yet approved or cleared by the [place] FDA and  has been authorized for detection and/or diagnosis of SARS-CoV-2 by FDA under an  Emergency Use Authorization (EUA). This EUA will remain  in effect (meaning this test can be used) for the duration of the COVID-19 declaration under Section 564(b)(1) of the Act, 21 U.S.C.section 360bbb-3(b)(1), unless the authorization is terminated  or revoked sooner.       Influenza A by PCR NEGATIVE NEGATIVE Final   Influenza B by PCR NEGATIVE NEGATIVE Final    Comment: (NOTE) The Xpert Xpress SARS-CoV-2/FLU/RSV plus assay is intended as an aid in the diagnosis of influenza from Nasopharyngeal swab specimens and should not be used as a sole basis for treatment. Nasal washings and aspirates are  unacceptable for Xpert Xpress SARS-CoV-2/FLU/RSV testing.  Fact Sheet for Patients: EntrepreneurPulse.com.au  Fact Sheet for Healthcare Providers: IncredibleEmployment.be  This test is not yet approved or cleared by the [place] FDA and has been authorized for detection and/or diagnosis of SARS-CoV-2 by FDA under an Emergency Use Authorization (EUA). This EUA will remain in effect (meaning this test can be used) for the duration of the COVID-19 declaration under Section 564(b)(1) of the Act, 21 U.S.C. section 360bbb-3(b)(1), unless the authorization is terminated or revoked.  Performed at [facility], [address] [place], [place] [address]          Radiology Studies: No results found.      Scheduled Meds:  amLODipine  2.5 mg Oral Daily   azithromycin  250 mg Oral Daily   budesonide  0.25 mg Nebulization BID   enoxaparin (LOVENOX) injection  40 mg Subcutaneous Q24H   escitalopram  10 mg Oral Daily   famotidine  20 mg Oral Daily   fluticasone  2 spray Each Nare Daily   gabapentin  600 mg Oral BID   guaiFENesin  1,200 mg Oral BID   insulin aspart  0-15 Units Subcutaneous TID WC   insulin aspart  0-5 Units Subcutaneous QHS   ipratropium-albuterol  3 mL Nebulization TID   lamoTRIgine  100 mg Oral BID   leflunomide  20 mg Oral Daily   mirabegron ER  50 mg Oral QHS   montelukast  10 mg Oral Daily   pantoprazole  40 mg Oral Daily   pravastatin  20 mg Oral q1800   predniSONE  40 mg Oral Q breakfast   QUEtiapine  25 mg Oral QHS   sucralfate  1 g Oral BID   Continuous Infusions:     LOS: 3 days    Time spent: 62mins    Kathie Dike, MD [facility]   If 7PM-7AM, please contact night-coverage [email or web address]  11/01/2020, 6:18 PM

## 2020-11-01 NOTE — TOC Progression Note (Signed)
Transition of Care [facility]) - Progression Note    Patient Details  Name: Kerri Carter MRN: [id number] Date of Birth: [birth year]  Transition of Care [facility] CM/SW [place], RN Phone Number: 11/01/2020, 5:07 PM  Clinical Narrative:   Patient accepts [place] bed offer, spoke with Magda Paganini who says patient can admit on Monday, Authorization submitted.    Expected Discharge Plan: [place] Barriers to Discharge: Continued Medical Work up  Expected Discharge Plan and Services Expected Discharge Plan: [place]   Discharge Planning Services: CM Consult   Living arrangements for the past 2 months: Single Family Home                 DME Arranged: N/A         HH Arranged: PT HH Agency: [place] [facility] Date [place]: 10/31/20 Time [place]: [id number] Representative spoke with at [place]: [place]   Social Determinants of Health (Worth) Interventions    Readmission Risk Interventions No flowsheet data found.

## 2020-11-01 NOTE — Care Management Important Message (Signed)
Important Message  Patient Details  Name: Kerri Carter MRN: [id number] Date of Birth: [birth year]   Medicare Important Message Given:  Yes     Dannette Barbara 11/01/2020, 1:10 PM

## 2020-11-02 ENCOUNTER — Encounter: Payer: Self-pay | Admitting: Family Medicine

## 2020-11-02 LAB — CBC
HCT: 33.6 % — ABNORMAL LOW (ref 36.0–46.0)
Hemoglobin: 10.9 g/dL — ABNORMAL LOW (ref 12.0–15.0)
MCH: 29.2 pg (ref 26.0–34.0)
MCHC: 32.4 g/dL (ref 30.0–36.0)
MCV: 90.1 fL (ref 80.0–100.0)
Platelets: 216 10*3/uL (ref 150–400)
RBC: 3.73 MIL/uL — ABNORMAL LOW (ref 3.87–5.11)
RDW: 15.1 % (ref 11.5–15.5)
WBC: 14.7 10*3/uL — ABNORMAL HIGH (ref 4.0–10.5)
nRBC: 0 % (ref 0.0–0.2)

## 2020-11-02 LAB — GLUCOSE, CAPILLARY
Glucose-Capillary: 141 mg/dL — ABNORMAL HIGH (ref 70–99)
Glucose-Capillary: 265 mg/dL — ABNORMAL HIGH (ref 70–99)
Glucose-Capillary: 205 mg/dL — ABNORMAL HIGH (ref 70–99)
Glucose-Capillary: 145 mg/dL — ABNORMAL HIGH (ref 70–99)

## 2020-11-02 LAB — BASIC METABOLIC PANEL
Anion gap: 8 (ref 5–15)
BUN: 23 mg/dL (ref 8–23)
CO2: 26 mmol/L (ref 22–32)
Calcium: 8.4 mg/dL — ABNORMAL LOW (ref 8.9–10.3)
Chloride: 105 mmol/L (ref 98–111)
Creatinine, Ser: 1.07 mg/dL — ABNORMAL HIGH (ref 0.44–1.00)
GFR, Estimated: 53 mL/min — ABNORMAL LOW (ref >60)
Glucose, Bld: 129 mg/dL — ABNORMAL HIGH (ref 70–99)
Potassium: 3.8 mmol/L (ref 3.5–5.1)
Sodium: 139 mmol/L (ref 135–145)

## 2020-11-02 LAB — BRAIN NATRIURETIC PEPTIDE: B Natriuretic Peptide: 238.5 pg/mL — ABNORMAL HIGH (ref 0.0–100.0)

## 2020-11-02 MED ORDER — FUROSEMIDE 10 MG/ML IJ SOLN
40.0000 mg | Freq: Once | INTRAMUSCULAR | Status: AC
  Administered 2020-11-02: 40 mg via INTRAVENOUS
  Filled 2020-11-02: qty 4

## 2020-11-02 NOTE — Progress Notes (Signed)
Nurse supervisor notified.

## 2020-11-02 NOTE — Progress Notes (Signed)
PROGRESS NOTE    Kerri Carter  YKD:[id number] DOB: 01-04-1941 DOA: 10/29/2020 PCP: McLean-Scocuzza, Nino Glow, MD    Brief Narrative:  80 year old female with a history of asthma/COPD, chronic kidney disease stage III, hypertension, interstitial lung disease, presents to the hospital worsening shortness of breath.  She was found to have possible COPD exacerbation and started on intravenous steroids, bronchodilators and antibiotics.  Imaging of chest did not show any evidence of pneumonia.   Assessment & Plan:   Active Problems:   COPD exacerbation (HCC)   Asthma/COPD exacerbation -Patient has history of asthma/COPD overlap syndrome -Patient has a history of prednisone dependent COPD -She uses oxygen at night and during the day as needed -She is normally able to ambulate around her home without becoming short of breath -She was started on intravenous steroids and appears to be showing improvement -We will transition to prednisone -Continue on bronchodilators, mucolytic's and inhaled steroids -She is on a course of azithromycin -She is followed by Dr. Patsey Berthold, pulmonologist in the outpatient setting -BNP mildly elevated and chest x-ray shows small left-sided pleural effusion -We will give 1 dose of Lasix  Chronic respiratory failure with hypoxia -Uses oxygen at home, as needed during the day, and continuous at night. -Suspect that she may need to wear oxygen continuously during the day as well after discharge  GERD -Continue PPI  Hypertension -Continued on amlodipine  Depression -She is continued on Lexapro and Seroquel  Rheumatoid arthritis -Continue on leflunomide  Anxiety -Continued on Xanax  Chronic kidney disease stage IIIa -Creatinine currently at baseline -Continue to monitor  Chronic back pain -Followed by neurosurgery with plans for surgical intervention next month -This is likely having a significant impact on her ability to ambulate due to significant  pain -Continue pain management  Generalized weakness -Seen by physical therapy with recommendations for skilled nursing facility placement -She will hopefully be able to discharge on 10/3   DVT prophylaxis: enoxaparin (LOVENOX) injection 40 mg Start: 10/29/20 2245  Code Status: Full code Family Communication: Discussed with patient Disposition Plan: Status is: Inpatient  Remains inpatient appropriate because:IV treatments appropriate due to intensity of illness or inability to take PO and Inpatient level of care appropriate due to severity of illness  Dispo: The patient is from: Home              Anticipated d/c is to: SNF              Patient currently is medically stable to d/c.   Difficult to place patient No    Consultants:    Procedures:    Antimicrobials:  Azithromycin 9/28>   Subjective: She feels that her breathing is mildly better today.  Currently on 3 L of oxygen.  Objective: Vitals:   11/02/20 1200 11/02/20 1338 11/02/20 1608 11/02/20 2021  BP: 135/63  140/88 (!) 174/68  Pulse: 84  83 88  Resp: 16  18 19   Temp: 98.3 F (36.8 C)  97.8 F (36.6 C) 98.4 F (36.9 C)  TempSrc:      SpO2: 94% 95% 97% 95%  Weight:      Height:        Intake/Output Summary (Last 24 hours) at 11/02/2020 2028 Last data filed at 11/02/2020 1902 Gross per 24 hour  Intake 960 ml  Output 2750 ml  Net -1790 ml   Filed Weights   10/29/20 1312  Weight: 83 kg    Examination:  General exam: Appears calm and comfortable  Respiratory system:  Clear bilaterally respiratory effort normal. Cardiovascular system: S1 & S2 heard, RRR. No JVD, murmurs, rubs, gallops or clicks. 1+pedal edema. Gastrointestinal system: Abdomen is nondistended, soft and nontender. No organomegaly or masses felt. Normal bowel sounds heard. Central nervous system: Alert and oriented. No focal neurological deficits. Extremities: Symmetric 5 x 5 power. Skin: No rashes, lesions or ulcers Psychiatry:  Judgement and insight appear normal. Mood & affect appropriate.     Data Reviewed: I have personally reviewed following labs and imaging studies  CBC: Recent Labs  Lab 10/29/20 1322 10/30/20 0635 11/02/20 0510  WBC 12.2* 8.1 14.7*  NEUTROABS 7.7  --   --   HGB 11.8* 11.4* 10.9*  HCT 37.1 36.4 33.6*  MCV 90.9 89.9 90.1  PLT 222 217 485   Basic Metabolic Panel: Recent Labs  Lab 10/29/20 1322 10/30/20 0635 11/02/20 0510  NA 139 141 139  K 4.5 4.1 3.8  CL 102 103 105  CO2 26 28 26   GLUCOSE 146* 207* 129*  BUN 15 15 23   CREATININE 1.24* 1.21* 1.07*  CALCIUM 8.4* 8.5* 8.4*   GFR: Estimated Creatinine Clearance: 42.3 mL/min (A) (by C-G formula based on SCr of 1.07 mg/dL (H)). Liver Function Tests: Recent Labs  Lab 10/29/20 1322  AST 20  ALT 16  ALKPHOS 77  BILITOT 0.6  PROT 6.7  ALBUMIN 3.4*   No results for input(s): LIPASE, AMYLASE in the last 168 hours. No results for input(s): AMMONIA in the last 168 hours. Coagulation Profile: No results for input(s): INR, PROTIME in the last 168 hours. Cardiac Enzymes: No results for input(s): CKTOTAL, CKMB, CKMBINDEX, TROPONINI in the last 168 hours. BNP (last 3 results) No results for input(s): PROBNP in the last 8760 hours. HbA1C: No results for input(s): HGBA1C in the last 72 hours.  CBG: Recent Labs  Lab 11/01/20 1547 11/01/20 2024 11/02/20 0738 11/02/20 1205 11/02/20 1621  GLUCAP 260* 190* 141* 265* 205*   Lipid Profile: No results for input(s): CHOL, HDL, LDLCALC, TRIG, CHOLHDL, LDLDIRECT in the last 72 hours. Thyroid Function Tests: No results for input(s): TSH, T4TOTAL, FREET4, T3FREE, THYROIDAB in the last 72 hours. Anemia Panel: No results for input(s): VITAMINB12, FOLATE, FERRITIN, TIBC, IRON, RETICCTPCT in the last 72 hours. Sepsis Labs: No results for input(s): PROCALCITON, LATICACIDVEN in the last 168 hours.  Recent Results (from the past 240 hour(s))  Resp Panel by RT-PCR (Flu A&B, Covid)  Nasopharyngeal Swab     Status: None   Collection Time: 10/29/20  1:22 PM   Specimen: Nasopharyngeal Swab; Nasopharyngeal(NP) swabs in vial transport medium  Result Value Ref Range Status   SARS Coronavirus 2 by RT PCR NEGATIVE NEGATIVE Final    Comment: (NOTE) SARS-CoV-2 target nucleic acids are NOT DETECTED.  The SARS-CoV-2 RNA is generally detectable in upper respiratory specimens during the acute phase of infection. The lowest concentration of SARS-CoV-2 viral copies this assay can detect is 138 copies/mL. A negative result does not preclude SARS-Cov-2 infection and should not be used as the sole basis for treatment or other patient management decisions. A negative result may occur with  improper specimen collection/handling, submission of specimen other than nasopharyngeal swab, presence of viral mutation(s) within the areas targeted by this assay, and inadequate number of viral copies(<138 copies/mL). A negative result must be combined with clinical observations, patient history, and epidemiological information. The expected result is Negative.  Fact Sheet for Patients:  EntrepreneurPulse.com.au  Fact Sheet for Healthcare Providers:  IncredibleEmployment.be  This test is no  t yet approved or cleared by the [place] and  has been authorized for detection and/or diagnosis of SARS-CoV-2 by FDA under an Emergency Use Authorization (EUA). This EUA will remain  in effect (meaning this test can be used) for the duration of the COVID-19 declaration under Section 564(b)(1) of the Act, 21 U.S.C.section 360bbb-3(b)(1), unless the authorization is terminated  or revoked sooner.       Influenza A by PCR NEGATIVE NEGATIVE Final   Influenza B by PCR NEGATIVE NEGATIVE Final    Comment: (NOTE) The Xpert Xpress SARS-CoV-2/FLU/RSV plus assay is intended as an aid in the diagnosis of influenza from Nasopharyngeal swab specimens and should not be  used as a sole basis for treatment. Nasal washings and aspirates are unacceptable for Xpert Xpress SARS-CoV-2/FLU/RSV testing.  Fact Sheet for Patients: EntrepreneurPulse.com.au  Fact Sheet for Healthcare Providers: IncredibleEmployment.be  This test is not yet approved or cleared by the [place] FDA and has been authorized for detection and/or diagnosis of SARS-CoV-2 by FDA under an Emergency Use Authorization (EUA). This EUA will remain in effect (meaning this test can be used) for the duration of the COVID-19 declaration under Section 564(b)(1) of the Act, 21 U.S.C. section 360bbb-3(b)(1), unless the authorization is terminated or revoked.  Performed at [facility], [address], [place], [place] [address]          Radiology Studies: [facility] Chest [place] 1 View  Result Date: 11/01/2020 CLINICAL DATA:  Peripheral edema and difficulty breathing, initial encounter EXAM: PORTABLE CHEST 1 VIEW COMPARISON:  10/29/20 FINDINGS: Cardiac shadow is stable. Lungs are well aerated bilaterally. Small left-sided pleural effusion is noted new from the prior exam. No focal infiltrate is seen. Old right rib fractures are again noted. Bilateral shoulder surgeries are seen and stable. IMPRESSION: New small left-sided pleural effusion. Electronically Signed   By: Inez Catalina M.D.   On: 11/01/2020 19:23        Scheduled Meds:  amLODipine  2.5 mg Oral Daily   azithromycin  250 mg Oral Daily   budesonide  0.25 mg Nebulization BID   enoxaparin (LOVENOX) injection  40 mg Subcutaneous Q24H   escitalopram  10 mg Oral Daily   famotidine  20 mg Oral Daily   fluticasone  2 spray Each Nare Daily   gabapentin  600 mg Oral BID   guaiFENesin  1,200 mg Oral BID   insulin aspart  0-15 Units Subcutaneous TID WC   insulin aspart  0-5 Units Subcutaneous QHS   ipratropium-albuterol  3 mL Nebulization TID   lamoTRIgine  100 mg Oral BID   leflunomide  20 mg  Oral Daily   mirabegron ER  50 mg Oral QHS   montelukast  10 mg Oral Daily   pantoprazole  40 mg Oral Daily   pravastatin  20 mg Oral q1800   predniSONE  40 mg Oral Q breakfast   QUEtiapine  25 mg Oral QHS   sucralfate  1 g Oral BID   Continuous Infusions:     LOS: 4 days    Time spent: 18mins    Kathie Dike, MD [facility]   If 7PM-7AM, please contact night-coverage [email or web address]  11/02/2020, 8:28 PM

## 2020-11-03 LAB — BASIC METABOLIC PANEL
Anion gap: 7 (ref 5–15)
BUN: 28 mg/dL — ABNORMAL HIGH (ref 8–23)
CO2: 33 mmol/L — ABNORMAL HIGH (ref 22–32)
Calcium: 8.4 mg/dL — ABNORMAL LOW (ref 8.9–10.3)
Chloride: 99 mmol/L (ref 98–111)
Creatinine, Ser: 1.27 mg/dL — ABNORMAL HIGH (ref 0.44–1.00)
GFR, Estimated: 43 mL/min — ABNORMAL LOW (ref >60)
Glucose, Bld: 116 mg/dL — ABNORMAL HIGH (ref 70–99)
Potassium: 3.3 mmol/L — ABNORMAL LOW (ref 3.5–5.1)
Sodium: 139 mmol/L (ref 135–145)

## 2020-11-03 LAB — MAGNESIUM: Magnesium: 1.9 mg/dL (ref 1.7–2.4)

## 2020-11-03 LAB — GLUCOSE, CAPILLARY
Glucose-Capillary: 106 mg/dL — ABNORMAL HIGH (ref 70–99)
Glucose-Capillary: 280 mg/dL — ABNORMAL HIGH (ref 70–99)
Glucose-Capillary: 189 mg/dL — ABNORMAL HIGH (ref 70–99)
Glucose-Capillary: 205 mg/dL — ABNORMAL HIGH (ref 70–99)

## 2020-11-03 MED ORDER — MIRABEGRON ER 50 MG PO TB24
50.0000 mg | ORAL_TABLET | Freq: Every morning | ORAL | Status: DC
  Administered 2020-11-04: 50 mg via ORAL
  Filled 2020-11-03: qty 1

## 2020-11-03 MED ORDER — POTASSIUM CHLORIDE CRYS ER 20 MEQ PO TBCR
40.0000 meq | EXTENDED_RELEASE_TABLET | ORAL | Status: AC
  Administered 2020-11-03 (×2): 40 meq via ORAL
  Filled 2020-11-03 (×2): qty 2

## 2020-11-03 NOTE — Progress Notes (Signed)
PROGRESS NOTE    NEESA KNAPIK  BSJ:[id number] DOB: [birth year] DOA: 10/29/2020 PCP: McLean-Scocuzza, Nino Glow, MD    Brief Narrative:  80 year old female with a history of asthma/COPD, chronic kidney disease stage III, hypertension, interstitial lung disease, presents to the hospital worsening shortness of breath.  She was found to have possible COPD exacerbation and started on intravenous steroids, bronchodilators and antibiotics.  Imaging of chest did not show any evidence of pneumonia.   Assessment & Plan:   Active Problems:   COPD exacerbation (HCC)   Asthma/COPD exacerbation -Patient has history of asthma/COPD overlap syndrome -Patient has a history of prednisone dependent COPD -She uses oxygen at night and during the day as needed -She is normally able to ambulate around her home without becoming short of breath -She was started on intravenous steroids and appears to be showing improvement -We will transition to prednisone -Continue on bronchodilators, mucolytic's and inhaled steroids -She is on a course of azithromycin -She is followed by Dr. Patsey Berthold, pulmonologist in the outpatient setting   Chronic respiratory failure with hypoxia -Uses oxygen at home, as needed during the day, and continuous at night. -Suspect that she may need to wear oxygen continuously during the day as well after discharge  GERD -Continue PPI  Hypertension -Continued on amlodipine  Depression -She is continued on Lexapro and Seroquel  Rheumatoid arthritis -Continue on leflunomide  Anxiety -Continued on Xanax  Chronic kidney disease stage IIIa -Creatinine currently at baseline -Continue to monitor  Chronic back pain -Followed by neurosurgery with plans for surgical intervention next month -This is likely having a significant impact on her ability to ambulate due to significant pain -Continue pain management  Generalized weakness -Seen by physical therapy with recommendations for  skilled nursing facility placement -She will hopefully be able to discharge on 10/3   DVT prophylaxis: enoxaparin (LOVENOX) injection 40 mg Start: 10/29/20 2245  Code Status: Full code Family Communication: Discussed with patient Disposition Plan: Status is: Inpatient  Remains inpatient appropriate because:IV treatments appropriate due to intensity of illness or inability to take PO and Inpatient level of care appropriate due to severity of illness  Dispo: The patient is from: Home              Anticipated d/c is to: SNF              Patient currently is medically stable to d/c.   Difficult to place patient No    Consultants:    Procedures:    Antimicrobials:  Azithromycin 9/28>   Subjective: Feels that her breathing is better today.  Less wheezing, no cough.  Objective: Vitals:   11/03/20 0829 11/03/20 1221 11/03/20 1345 11/03/20 1653  BP: 133/80 133/62  (!) 148/66  Pulse: 81 86  82  Resp: 18 18  18   Temp: 97.7 F (36.5 C) 98 F (36.7 C)  98 F (36.7 C)  TempSrc:      SpO2: 95% 94% 94% 100%  Weight:      Height:        Intake/Output Summary (Last 24 hours) at 11/03/2020 1955 Last data filed at 11/03/2020 1835 Gross per 24 hour  Intake 960 ml  Output 2250 ml  Net -1290 ml   Filed Weights   10/29/20 1312  Weight: 83 kg    Examination:  General exam: Appears calm and comfortable  Respiratory system: Clear bilaterally respiratory effort normal. Cardiovascular system: S1 & S2 heard, RRR. No JVD, murmurs, rubs, gallops or clicks. No pedal  edema. Gastrointestinal system: Abdomen is nondistended, soft and nontender. No organomegaly or masses felt. Normal bowel sounds heard. Central nervous system: Alert and oriented. No focal neurological deficits. Extremities: Symmetric 5 x 5 power. Skin: No rashes, lesions or ulcers Psychiatry: Judgement and insight appear normal. Mood & affect appropriate.     Data Reviewed: I have personally reviewed following labs  and imaging studies  CBC: Recent Labs  Lab 10/29/20 1322 10/30/20 0635 11/02/20 0510  WBC 12.2* 8.1 14.7*  NEUTROABS 7.7  --   --   HGB 11.8* 11.4* 10.9*  HCT 37.1 36.4 33.6*  MCV 90.9 89.9 90.1  PLT 222 217 151   Basic Metabolic Panel: Recent Labs  Lab 10/29/20 1322 10/30/20 0635 11/02/20 0510 11/03/20 0548  NA 139 141 139 139  K 4.5 4.1 3.8 3.3*  CL 102 103 105 99  CO2 26 28 26  33*  GLUCOSE 146* 207* 129* 116*  BUN 15 15 23  28*  CREATININE 1.24* 1.21* 1.07* 1.27*  CALCIUM 8.4* 8.5* 8.4* 8.4*  MG  --   --   --  1.9   GFR: Estimated Creatinine Clearance: 35.7 mL/min (A) (by C-G formula based on SCr of 1.27 mg/dL (H)). Liver Function Tests: Recent Labs  Lab 10/29/20 1322  AST 20  ALT 16  ALKPHOS 77  BILITOT 0.6  PROT 6.7  ALBUMIN 3.4*   No results for input(s): LIPASE, AMYLASE in the last 168 hours. No results for input(s): AMMONIA in the last 168 hours. Coagulation Profile: No results for input(s): INR, PROTIME in the last 168 hours. Cardiac Enzymes: No results for input(s): CKTOTAL, CKMB, CKMBINDEX, TROPONINI in the last 168 hours. BNP (last 3 results) No results for input(s): PROBNP in the last 8760 hours. HbA1C: No results for input(s): HGBA1C in the last 72 hours.  CBG: Recent Labs  Lab 11/02/20 1621 11/02/20 2049 11/03/20 0830 11/03/20 1223 11/03/20 1710  GLUCAP 205* 145* 106* 280* 189*   Lipid Profile: No results for input(s): CHOL, HDL, LDLCALC, TRIG, CHOLHDL, LDLDIRECT in the last 72 hours. Thyroid Function Tests: No results for input(s): TSH, T4TOTAL, FREET4, T3FREE, THYROIDAB in the last 72 hours. Anemia Panel: No results for input(s): VITAMINB12, FOLATE, FERRITIN, TIBC, IRON, RETICCTPCT in the last 72 hours. Sepsis Labs: No results for input(s): PROCALCITON, LATICACIDVEN in the last 168 hours.  Recent Results (from the past 240 hour(s))  Resp Panel by RT-PCR (Flu A&B, Covid) Nasopharyngeal Swab     Status: None   Collection Time:  10/29/20  1:22 PM   Specimen: Nasopharyngeal Swab; Nasopharyngeal(NP) swabs in vial transport medium  Result Value Ref Range Status   SARS Coronavirus 2 by RT PCR NEGATIVE NEGATIVE Final    Comment: (NOTE) SARS-CoV-2 target nucleic acids are NOT DETECTED.  The SARS-CoV-2 RNA is generally detectable in upper respiratory specimens during the acute phase of infection. The lowest concentration of SARS-CoV-2 viral copies this assay can detect is 138 copies/mL. A negative result does not preclude SARS-Cov-2 infection and should not be used as the sole basis for treatment or other patient management decisions. A negative result may occur with  improper specimen collection/handling, submission of specimen other than nasopharyngeal swab, presence of viral mutation(s) within the areas targeted by this assay, and inadequate number of viral copies(<138 copies/mL). A negative result must be combined with clinical observations, patient history, and epidemiological information. The expected result is Negative.  Fact Sheet for Patients:  EntrepreneurPulse.com.au  Fact Sheet for Healthcare Providers:  IncredibleEmployment.be  This test  is no t yet approved or cleared by the [place] and  has been authorized for detection and/or diagnosis of SARS-CoV-2 by FDA under an Emergency Use Authorization (EUA). This EUA will remain  in effect (meaning this test can be used) for the duration of the COVID-19 declaration under Section 564(b)(1) of the Act, 21 U.S.C.section 360bbb-3(b)(1), unless the authorization is terminated  or revoked sooner.       Influenza A by PCR NEGATIVE NEGATIVE Final   Influenza B by PCR NEGATIVE NEGATIVE Final    Comment: (NOTE) The Xpert Xpress SARS-CoV-2/FLU/RSV plus assay is intended as an aid in the diagnosis of influenza from Nasopharyngeal swab specimens and should not be used as a sole basis for treatment. Nasal washings  and aspirates are unacceptable for Xpert Xpress SARS-CoV-2/FLU/RSV testing.  Fact Sheet for Patients: EntrepreneurPulse.com.au  Fact Sheet for Healthcare Providers: IncredibleEmployment.be  This test is not yet approved or cleared by the [place] FDA and has been authorized for detection and/or diagnosis of SARS-CoV-2 by FDA under an Emergency Use Authorization (EUA). This EUA will remain in effect (meaning this test can be used) for the duration of the COVID-19 declaration under Section 564(b)(1) of the Act, 21 U.S.C. section 360bbb-3(b)(1), unless the authorization is terminated or revoked.  Performed at [facility], [address], [place],  [place] [address]          Radiology Studies: No results found.      Scheduled Meds:  amLODipine  2.5 mg Oral Daily   budesonide  0.25 mg Nebulization BID   enoxaparin (LOVENOX) injection  40 mg Subcutaneous Q24H   escitalopram  10 mg Oral Daily   famotidine  20 mg Oral Daily   fluticasone  2 spray Each Nare Daily   gabapentin  600 mg Oral BID   guaiFENesin  1,200 mg Oral BID   insulin aspart  0-15 Units Subcutaneous TID WC   insulin aspart  0-5 Units Subcutaneous QHS   ipratropium-albuterol  3 mL Nebulization TID   lamoTRIgine  100 mg Oral BID   leflunomide  20 mg Oral Daily   mirabegron ER  50 mg Oral QHS   montelukast  10 mg Oral Daily   pantoprazole  40 mg Oral Daily   pravastatin  20 mg Oral q1800   predniSONE  40 mg Oral Q breakfast   QUEtiapine  25 mg Oral QHS   sucralfate  1 g Oral BID   Continuous Infusions:     LOS: 5 days    Time spent: 2mins    Kathie Dike, MD [facility]   If 7PM-7AM, please contact night-coverage [email or web address]  11/03/2020, 7:55 PM

## 2020-11-04 DIAGNOSIS — Z9981 Dependence on supplemental oxygen: Secondary | ICD-10-CM | POA: Diagnosis not present

## 2020-11-04 DIAGNOSIS — J9611 Chronic respiratory failure with hypoxia: Secondary | ICD-10-CM | POA: Diagnosis not present

## 2020-11-04 DIAGNOSIS — K219 Gastro-esophageal reflux disease without esophagitis: Secondary | ICD-10-CM | POA: Diagnosis not present

## 2020-11-04 DIAGNOSIS — I10 Essential (primary) hypertension: Secondary | ICD-10-CM | POA: Diagnosis not present

## 2020-11-04 DIAGNOSIS — F325 Major depressive disorder, single episode, in full remission: Secondary | ICD-10-CM | POA: Insufficient documentation

## 2020-11-04 DIAGNOSIS — R5381 Other malaise: Secondary | ICD-10-CM | POA: Diagnosis not present

## 2020-11-04 DIAGNOSIS — Z87891 Personal history of nicotine dependence: Secondary | ICD-10-CM | POA: Diagnosis not present

## 2020-11-04 DIAGNOSIS — Z7952 Long term (current) use of systemic steroids: Secondary | ICD-10-CM | POA: Diagnosis not present

## 2020-11-04 DIAGNOSIS — N1832 Chronic kidney disease, stage 3b: Secondary | ICD-10-CM | POA: Diagnosis not present

## 2020-11-04 DIAGNOSIS — M0579 Rheumatoid arthritis with rheumatoid factor of multiple sites without organ or systems involvement: Secondary | ICD-10-CM | POA: Diagnosis not present

## 2020-11-04 DIAGNOSIS — J849 Interstitial pulmonary disease, unspecified: Secondary | ICD-10-CM | POA: Diagnosis not present

## 2020-11-04 DIAGNOSIS — F419 Anxiety disorder, unspecified: Secondary | ICD-10-CM

## 2020-11-04 DIAGNOSIS — J439 Emphysema, unspecified: Secondary | ICD-10-CM | POA: Diagnosis not present

## 2020-11-04 DIAGNOSIS — F3160 Bipolar disorder, current episode mixed, unspecified: Secondary | ICD-10-CM | POA: Diagnosis not present

## 2020-11-04 DIAGNOSIS — J441 Chronic obstructive pulmonary disease with (acute) exacerbation: Secondary | ICD-10-CM | POA: Diagnosis not present

## 2020-11-04 DIAGNOSIS — R279 Unspecified lack of coordination: Secondary | ICD-10-CM | POA: Diagnosis not present

## 2020-11-04 DIAGNOSIS — M6281 Muscle weakness (generalized): Secondary | ICD-10-CM | POA: Diagnosis not present

## 2020-11-04 DIAGNOSIS — J45998 Other asthma: Secondary | ICD-10-CM | POA: Diagnosis not present

## 2020-11-04 DIAGNOSIS — J449 Chronic obstructive pulmonary disease, unspecified: Secondary | ICD-10-CM | POA: Diagnosis not present

## 2020-11-04 DIAGNOSIS — M069 Rheumatoid arthritis, unspecified: Secondary | ICD-10-CM | POA: Diagnosis not present

## 2020-11-04 LAB — SARS CORONAVIRUS 2 (TAT 6-24 HRS): SARS Coronavirus 2: NEGATIVE

## 2020-11-04 LAB — GLUCOSE, CAPILLARY
Glucose-Capillary: 106 mg/dL — ABNORMAL HIGH (ref 70–99)
Glucose-Capillary: 306 mg/dL — ABNORMAL HIGH (ref 70–99)
Glucose-Capillary: 269 mg/dL — ABNORMAL HIGH (ref 70–99)

## 2020-11-04 MED ORDER — ALPRAZOLAM 0.25 MG PO TABS: ORAL_TABLET | ORAL | 0 refills | Status: DC

## 2020-11-04 MED ORDER — HYDROCODONE-ACETAMINOPHEN 5-325 MG PO TABS: 1.0000 | ORAL_TABLET | Freq: Four times a day (QID) | ORAL | 0 refills | Status: DC | PRN

## 2020-11-04 MED ORDER — PREDNISONE 5 MG PO TABS: ORAL_TABLET | ORAL | Status: DC

## 2020-11-04 MED ORDER — CYCLOBENZAPRINE HCL 10 MG PO TABS: 10.0000 mg | ORAL_TABLET | Freq: Three times a day (TID) | ORAL | 0 refills | Status: DC | PRN

## 2020-11-04 MED ORDER — GUAIFENESIN ER 600 MG PO TB12: 600.0000 mg | ORAL_TABLET | Freq: Two times a day (BID) | ORAL | Status: DC

## 2020-11-04 NOTE — TOC Progression Note (Signed)
Transition of Care [facility]) - Progression Note    Patient Details  Name: Kerri Carter MRN: [id number] Date of Birth: [birth year]  [facility] CM/SW [place], RN Phone Number: 11/04/2020, 12:02 PM  Clinical Narrative:    The patient going to room 504 at [facility] choice to pick up at [facility] to transport, Patient to notify her family   Expected Discharge Plan: [place] Barriers to Discharge: Continued Medical Work up  Expected Discharge Plan and Services Expected Discharge Plan: [place]   Discharge Planning Services: CM Consult   Living arrangements for the past 2 months: Single Family Home Expected Discharge Date: 11/04/20               DME Arranged: N/A         HH Arranged: PT HH Agency: [place] Date [place]: 10/31/20 Time [place]: 2060 Representative spoke with at [place]: [place]   Social Determinants of Health ([place]) Interventions    Readmission Risk Interventions No flowsheet data found.

## 2020-11-04 NOTE — Plan of Care (Signed)

## 2020-11-04 NOTE — Progress Notes (Signed)
Report called to [facility], LPN at [facility] with opportunity to ask questions.

## 2020-11-04 NOTE — Discharge Summary (Signed)
Physician Discharge Summary  Kerri Carter:[id number] DOB: 20-Mar-1940 DOA: 10/29/2020  PCP: McLean-Scocuzza, Nino Glow, MD  Admit date: 10/29/2020 Discharge date: 11/04/2020  Admitted From: home Disposition:  SNF  Recommendations for Outpatient Follow-up:  Follow up with PCP in 1-2 weeks Please obtain BMP/CBC in one week Follow up with pulmonologist in the next 2-3 weeks Follow up with neurosurgery as scheduled  Discharge Condition:stable CODE STATUS:full code Diet recommendation: heart healthy  Brief/Interim Summary: 80 year old female with a history of asthma/COPD, chronic kidney disease stage III, hypertension, interstitial lung disease, presents to the hospital worsening shortness of breath.  She was found to have possible COPD exacerbation and started on intravenous steroids, bronchodilators and antibiotics.  Imaging of chest did not show any evidence of pneumonia.  Discharge Diagnoses:  Active Problems:   COPD exacerbation (HCC)  Asthma/COPD exacerbation -Patient has history of asthma/COPD overlap syndrome -Patient has a history of prednisone dependent COPD -She uses oxygen at night and during the day as needed -She is normally able to ambulate around her home without becoming short of breath -She was started on intravenous steroids and appears to be showing improvement -We will transition to prednisone -Continue on bronchodilators, mucolytic's and inhaled steroids -She completed a course of azithromycin in the hospital -She is followed by Dr. Patsey Berthold, pulmonologist in the outpatient setting     Chronic respiratory failure with hypoxia -Uses oxygen at home, as needed during the day, and continuous at night. -Suspect that she may need to wear oxygen continuously during the day as well after discharge   GERD -Continue PPI   Hypertension -Continued on amlodipine   Depression -She is continued on Lexapro and Seroquel   Rheumatoid arthritis -Continue on  leflunomide   Anxiety -Continued on Xanax   Chronic kidney disease stage IIIa -Creatinine currently at baseline -Continue to monitor   Chronic back pain -Followed by neurosurgery with plans for surgical intervention next month -This is likely having a significant impact on her ability to ambulate due to significant pain -Continue pain management   Generalized weakness -Seen by physical therapy with recommendations for skilled nursing facility placement -She will hopefully be able to discharge on 10/3  Discharge Instructions  Discharge Instructions     Diet - low sodium heart healthy   Complete by: As directed    Increase activity slowly   Complete by: As directed       Allergies as of 11/04/2020       Reactions   Ceftin [cefuroxime Axetil] Anaphylaxis   Lisinopril Anaphylaxis   Sulfa Antibiotics Other (See Comments)   Face swelling   Sulfasalazine Anaphylaxis   Morphine Other (See Comments)   Per patient, low blood pressure issues that requires action to raise it back up. Can take small infrequent doses   Xarelto [rivaroxaban] Other (See Comments)   Stomach burning, bleeding, and tar in stool   Adhesive [tape] Rash   Paper tape and tega derm OK   Antihistamines, Chlorpheniramine-type Other (See Comments)   Makes pt hyper   Antivert [meclizine Hcl] Other (See Comments)   Bladder will not empty   Aspirin Other (See Comments)   Sulfasalazine allergy cross reacts   Contrast Media [iodinated Diagnostic Agents] Rash   she is able to use betadine scrubs.   Decongestant [pseudoephedrine Hcl] Other (See Comments)   Makes pt hyper   Doxycycline Other (See Comments)   GI upset   Levaquin [levofloxacin In D5w] Rash   Polymyxin B Other (See Comments)  Facial rash   Tetanus Toxoids Rash, Other (See Comments)   Fever and hot to touch at injection site        Medication List     STOP taking these medications    amoxicillin-clavulanate 875-125 MG tablet Commonly  known as: Augmentin   etodolac 500 MG tablet Commonly known as: LODINE       TAKE these medications    Adalimumab 40 MG/0.4ML Pnkt Inject 40 mg into the skin every 14 (fourteen) days.   albuterol 108 (90 Base) MCG/ACT inhaler Commonly known as: Ventolin HFA Inhale 2 puffs into the lungs every 6 (six) hours as needed for wheezing or shortness of breath.   ALPRAZolam 0.25 MG tablet Commonly known as: XANAX TAKE 1 TABLET BY MOUTH AT BEDTIME AS NEEDED FOR ANXIETY.   amLODipine 2.5 MG tablet Commonly known as: NORVASC Take 1 tablet (2.5 mg total) by mouth daily.   benzonatate 200 MG capsule Commonly known as: TESSALON Take 1 capsule (200 mg total) by mouth 3 (three) times daily as needed for cough.   budesonide 0.25 MG/2ML nebulizer solution Commonly known as: PULMICORT USE 1 VIAL  IN  NEBULIZER TWICE  DAILY - rinse mouth after treatment   cyclobenzaprine 10 MG tablet Commonly known as: FLEXERIL Take 1 tablet (10 mg total) by mouth 3 (three) times daily as needed for muscle spasms.   dicyclomine 10 MG capsule Commonly known as: BENTYL Take 1 capsule (10 mg total) by mouth 2 (two) times daily as needed for spasms.   escitalopram 10 MG tablet Commonly known as: LEXAPRO TAKE 1 TABLET BY MOUTH EVERY DAY   famotidine 20 MG tablet Commonly known as: PEPCID TAKE 1 TABLET (20 MG TOTAL) BY MOUTH DAILY. BEFORE BREAKFAST OR DINNER   fluticasone 50 MCG/ACT nasal spray Commonly known as: Flonase Place 2 sprays into both nostrils daily. In am   gabapentin 300 MG capsule Commonly known as: NEURONTIN Take 2 capsules (600 mg total) by mouth in the morning and at bedtime.   guaiFENesin 600 MG 12 hr tablet Commonly known as: MUCINEX Take 1 tablet (600 mg total) by mouth 2 (two) times daily.   HYDROcodone-acetaminophen 5-325 MG tablet Commonly known as: NORCO/VICODIN Take 1 tablet by mouth every 6 (six) hours as needed for moderate pain.   ipratropium-albuterol 0.5-2.5 (3)  MG/3ML Soln Commonly known as: DUONEB TAKE 3 MLS BY NEBULIZATION 3 (THREE) TIMES DAILY AS NEEDED.   lamoTRIgine 100 MG tablet Commonly known as: LAMICTAL TAKE 1 TAB 2 TIMES DAILY. FURTHER REFILLS NEW PSYCH FOR ALL PSYCH MEDS ONLY TEMP SUPPLY FROM PCP   leflunomide 20 MG tablet Commonly known as: ARAVA Take 1 tablet (20 mg total) by mouth daily.   lovastatin 20 MG tablet Commonly known as: MEVACOR TAKE 1 TABLET BY MOUTH EVERYDAY AT BEDTIME *STOP TALKING 40MG *   montelukast 10 MG tablet Commonly known as: SINGULAIR TAKE 1 TABLET BY MOUTH EVERY DAY   Myrbetriq 50 MG Tb24 tablet Generic drug: mirabegron ER TAKE 1 TABLET BY MOUTH EVERYDAY AT BEDTIME   nebivolol 10 MG tablet Commonly known as: Bystolic Take 0.5 tablets (5 mg total) by mouth in the morning and at bedtime.   pantoprazole 40 MG tablet Commonly known as: PROTONIX TAKE 1 TABLET BY MOUTH 2 TIMES DAILY 30 MIN BEFORE FOOD (NOTE REDUCTION IN FREQUENCY)   predniSONE 5 MG tablet Commonly known as: DELTASONE Take 40mg  po daily for 2 days then 30mg  daily for 2 days then 20mg  daily for 2 days  then 10mg  daily for 2 days then 7.5mg  daily What changed:  how much to take how to take this when to take this additional instructions Another medication with the same name was removed. Continue taking this medication, and follow the directions you see here.   QUEtiapine 25 MG tablet Commonly known as: SEROQUEL TAKE 1 TABLET (25 MG TOTAL) BY MOUTH AT BEDTIME. AGAIN LAST FILL FURTHER REFILLS FROM PSYCHIATRY NO EXCEPTIONS   sucralfate 1 g tablet Commonly known as: CARAFATE Take 1 tablet (1 g total) by mouth 2 (two) times daily.   zoledronic acid 5 MG/100ML Soln injection Commonly known as: RECLAST Inject 5 mg into the vein once.        Contact information for after-discharge care     [facility] Preferred SNF .   Service: Skilled  Nursing Contact information: [place] [phone]                    Allergies  Allergen Reactions   Ceftin [Cefuroxime Axetil] Anaphylaxis   Lisinopril Anaphylaxis   Sulfa Antibiotics Other (See Comments)    Face swelling   Sulfasalazine Anaphylaxis   Morphine Other (See Comments)    Per patient, low blood pressure issues that requires action to raise it back up. Can take small infrequent doses   Xarelto [Rivaroxaban] Other (See Comments)    Stomach burning, bleeding, and tar in stool   Adhesive [Tape] Rash    Paper tape and tega derm OK   Antihistamines, Chlorpheniramine-Type Other (See Comments)    Makes pt hyper   Antivert [Meclizine Hcl] Other (See Comments)    Bladder will not empty   Aspirin Other (See Comments)    Sulfasalazine allergy cross reacts   Contrast Media [Iodinated Diagnostic Agents] Rash    she is able to use betadine scrubs.   Decongestant [Pseudoephedrine Hcl] Other (See Comments)    Makes pt hyper   Doxycycline Other (See Comments)    GI upset   Levaquin [Levofloxacin In D5w] Rash   Polymyxin B Other (See Comments)    Facial rash   Tetanus Toxoids Rash and Other (See Comments)    Fever and hot to touch at injection site    Consultations:    Procedures/Studies: DG Chest 2 View  Result Date: 10/29/2020 CLINICAL DATA:  Shortness of breath EXAM: CHEST - 2 VIEW COMPARISON:  10/08/2020 FINDINGS: The heart size and mediastinal contours are within normal limits. Both lungs are clear. Status post bilateral reverse shoulder arthroplasty. Multiple chronic, callused fractures of the right ribs. Disc degenerative disease of the thoracic spine. IMPRESSION: No acute abnormality of the lungs. Electronically Signed   By: Eddie Candle M.D.   On: 10/29/2020 14:39   DG Chest 2 View  Result Date: 10/08/2020 CLINICAL DATA:  80 year old female with history of sudden onset of chest pain and nausea. EXAM: CHEST  - 2 VIEW COMPARISON:  Chest x-ray 03/24/2020. FINDINGS: Lung volumes are low. No consolidative airspace disease. No pleural effusions. No pneumothorax. No pulmonary nodule or mass noted. Pulmonary vasculature and the cardiomediastinal silhouette are within normal limits. Old healed posterolateral right-sided rib fractures are again noted. Status post bilateral shoulder arthroplasty. Status post ORIF in the distal aspect of the left clavicle and acromion. IMPRESSION: 1. Low lung volumes without radiographic evidence of acute cardiopulmonary disease. Electronically Signed   By: Mauri Brooklyn.D.  On: 10/08/2020 12:08   MR ANGIO CHEST W WO CONTRAST  Result Date: 10/08/2020 CLINICAL DATA:  Acute onset of severe chest pain radiating into the back. Evaluate for aortic dissection. EXAM: MRA CHEST WITH OR WITHOUT CONTRAST TECHNIQUE: Angiographic images of the chest were obtained using MRA technique without and with intravenous contrast. CONTRAST:  6mL GADAVIST GADOBUTROL 1 MMOL/ML IV SOLN COMPARISON:  None. FINDINGS: VASCULAR Aorta: The aorta is normal in caliber. No evidence of aortic dissection. Two vessel arch anatomy. The right brachiocephalic and left common carotid artery share a common origin. Very mild atherosclerotic plaque is visualized. Heart: The heart is normal in size.  No pericardial effusion. Pulmonary Arteries:  Normal in size.  No central PE. Other: None. NON-VASCULAR Mediastinum: Normal MR appearance of the thyroid gland. No mediastinal mass or adenopathy. Unremarkable esophagus. Lungs/pleura: Mild dependent atelectasis. No focal enhancement or abnormal signal. Upper abdomen: No acute abnormality within the visualized upper abdomen. Bones: No focal signal abnormality or abnormal enhancement. Metallic artifact in the bilateral shoulders consistent with shoulder arthroplasties. IMPRESSION: 1. No evidence of aortic dissection or aneurysm. 2. No large central pulmonary embolus. 3. Mild dependent  atelectasis. Electronically Signed   By: Jacqulynn Cadet M.D.   On: 10/08/2020 16:01   DG Chest Port 1 View  Result Date: 11/01/2020 CLINICAL DATA:  Peripheral edema and difficulty breathing, initial encounter EXAM: PORTABLE CHEST 1 VIEW COMPARISON:  10/29/20 FINDINGS: Cardiac shadow is stable. Lungs are well aerated bilaterally. Small left-sided pleural effusion is noted new from the prior exam. No focal infiltrate is seen. Old right rib fractures are again noted. Bilateral shoulder surgeries are seen and stable. IMPRESSION: New small left-sided pleural effusion. Electronically Signed   By: Inez Catalina M.D.   On: 11/01/2020 19:23   Pulmonary Function Test [facility] Only  Result Date: 10/10/2020 Good patient effort. Patient had a difficult time performing tes especially pleth portion. The test contained coughing, which may affect the validity of flow parameters. The patient was unable to maintain a pant frequency of 60 to 90 breaths per minute during the airway resistance measurement. The results of this test meets ATS standards for acceptability and repeatability. Spirometry Data Is Acceptable and Reproducible Mild Obstructive Airways Disease without  Significant Broncho-Dilator Response Consider outpatient Pulmonary Consultation if needed Clinical Correlation Advised   DG Hip Unilat W or Wo Pelvis 2-3 Views Left  Result Date: 10/29/2020 CLINICAL DATA:  Left hip pain, does the EXAM: DG HIP (WITH OR WITHOUT PELVIS) 2-3V LEFT COMPARISON:  12/10/2011 left hip radiographs FINDINGS: Status post left total hip arthroplasty. Partially visualized right total hip arthroplasty. No evidence of hardware fracture or loosening. No pelvic fracture or diastasis. No left hip osseous fracture or dislocation. No focal osseous lesions. Bilateral posterior spinal fusion in the lower lumbar spine. IMPRESSION: No acute osseous abnormality. Bilateral total hip arthroplasty with no evidence of hardware complication. Electronically  Signed   By: Ilona Sorrel M.D.   On: 10/29/2020 14:44      Subjective: Feels that breathing is improved. Continues to have back/ left leg pain  Discharge Exam: Vitals:   11/03/20 2047 11/04/20 0442 11/04/20 0757 11/04/20 0842  BP:  (!) 142/63 134/87   Pulse:  78 77   Resp:  19 16   Temp:  97.8 F (36.6 C) 98.4 F (36.9 C)   TempSrc:  Oral    SpO2: 96% 95% 94% 94%  Weight:      Height:  General: Pt is alert, awake, not in acute distress Cardiovascular: RRR, S1/S2 +, no rubs, no gallops Respiratory: CTA bilaterally, no wheezing, no rhonchi Abdominal: Soft, NT, ND, bowel sounds + Extremities: no edema, no cyanosis    The results of significant diagnostics from this hospitalization (including imaging, microbiology, ancillary and laboratory) are listed below for reference.     Microbiology: Recent Results (from the past 240 hour(s))  Resp Panel by RT-PCR (Flu A&B, Covid) Nasopharyngeal Swab     Status: None   Collection Time: 10/29/20  1:22 PM   Specimen: Nasopharyngeal Swab; Nasopharyngeal(NP) swabs in vial transport medium  Result Value Ref Range Status   SARS Coronavirus 2 by RT PCR NEGATIVE NEGATIVE Final    Comment: (NOTE) SARS-CoV-2 target nucleic acids are NOT DETECTED.  The SARS-CoV-2 RNA is generally detectable in upper respiratory specimens during the acute phase of infection. The lowest concentration of SARS-CoV-2 viral copies this assay can detect is 138 copies/mL. A negative result does not preclude SARS-Cov-2 infection and should not be used as the sole basis for treatment or other patient management decisions. A negative result may occur with  improper specimen collection/handling, submission of specimen other than nasopharyngeal swab, presence of viral mutation(s) within the areas targeted by this assay, and inadequate number of viral copies(<138 copies/mL). A negative result must be combined with clinical observations, patient history, and  epidemiological information. The expected result is Negative.  Fact Sheet for Patients:  EntrepreneurPulse.com.au  Fact Sheet for Healthcare Providers:  IncredibleEmployment.be  This test is no t yet approved or cleared by the [place] FDA and  has been authorized for detection and/or diagnosis of SARS-CoV-2 by FDA under an Emergency Use Authorization (EUA). This EUA will remain  in effect (meaning this test can be used) for the duration of the COVID-19 declaration under Section 564(b)(1) of the Act, 21 U.S.C.section 360bbb-3(b)(1), unless the authorization is terminated  or revoked sooner.       Influenza A by PCR NEGATIVE NEGATIVE Final   Influenza B by PCR NEGATIVE NEGATIVE Final    Comment: (NOTE) The Xpert Xpress SARS-CoV-2/FLU/RSV plus assay is intended as an aid in the diagnosis of influenza from Nasopharyngeal swab specimens and should not be used as a sole basis for treatment. Nasal washings and aspirates are unacceptable for Xpert Xpress SARS-CoV-2/FLU/RSV testing.  Fact Sheet for Patients: EntrepreneurPulse.com.au  Fact Sheet for Healthcare Providers: IncredibleEmployment.be  This test is not yet approved or cleared by the [place] FDA and has been authorized for detection and/or diagnosis of SARS-CoV-2 by FDA under an Emergency Use Authorization (EUA). This EUA will remain in effect (meaning this test can be used) for the duration of the COVID-19 declaration under Section 564(b)(1) of the Act, 21 U.S.C. section 360bbb-3(b)(1), unless the authorization is terminated or revoked.  Performed at [facility], [place], [place] [address]   SARS CORONAVIRUS 2 (TAT 6-24 HRS) Nasopharyngeal Nasopharyngeal Swab     Status: None   Collection Time: 11/03/20  2:42 PM   Specimen: Nasopharyngeal Swab  Result Value Ref Range Status   SARS Coronavirus 2 NEGATIVE  NEGATIVE Final    Comment: (NOTE) SARS-CoV-2 target nucleic acids are NOT DETECTED.  The SARS-CoV-2 RNA is generally detectable in upper and lower respiratory specimens during the acute phase of infection. Negative results do not preclude SARS-CoV-2 infection, do not rule out co-infections with other pathogens, and should not be used as the sole basis for treatment or other patient management  decisions. Negative results must be combined with clinical observations, patient history, and epidemiological information. The expected result is Negative.  Fact Sheet for Patients: SugarRoll.be  Fact Sheet for Healthcare Providers: https://www.woods-mathews.com/  This test is not yet approved or cleared by the [place] FDA and  has been authorized for detection and/or diagnosis of SARS-CoV-2 by FDA under an Emergency Use Authorization (EUA). This EUA will remain  in effect (meaning this test can be used) for the duration of the COVID-19 declaration under Se ction 564(b)(1) of the Act, 21 U.S.C. section 360bbb-3(b)(1), unless the authorization is terminated or revoked sooner.  Performed at [facility], [place] [address], [place], [place] [address]      Labs: BNP (last 3 results) Recent Labs    10/29/20 1322 11/02/20 0510  BNP 209.3* 696.2*   Basic Metabolic Panel: Recent Labs  Lab 10/29/20 1322 10/30/20 0635 11/02/20 0510 11/03/20 0548  NA 139 141 139 139  K 4.5 4.1 3.8 3.3*  CL 102 103 105 99  CO2 26 28 26  33*  GLUCOSE 146* 207* 129* 116*  BUN 15 15 23  28*  CREATININE 1.24* 1.21* 1.07* 1.27*  CALCIUM 8.4* 8.5* 8.4* 8.4*  MG  --   --   --  1.9   Liver Function Tests: Recent Labs  Lab 10/29/20 1322  AST 20  ALT 16  ALKPHOS 77  BILITOT 0.6  PROT 6.7  ALBUMIN 3.4*   No results for input(s): LIPASE, AMYLASE in the last 168 hours. No results for input(s): AMMONIA in the last 168 hours. CBC: Recent Labs  Lab  10/29/20 1322 10/30/20 0635 11/02/20 0510  WBC 12.2* 8.1 14.7*  NEUTROABS 7.7  --   --   HGB 11.8* 11.4* 10.9*  HCT 37.1 36.4 33.6*  MCV 90.9 89.9 90.1  PLT 222 217 216   Cardiac Enzymes: No results for input(s): CKTOTAL, CKMB, CKMBINDEX, TROPONINI in the last 168 hours. BNP: Invalid input(s): POCBNP CBG: Recent Labs  Lab 11/03/20 0830 11/03/20 1223 11/03/20 1710 11/03/20 2022 11/04/20 0801  GLUCAP 106* 280* 189* 205* 106*   D-Dimer No results for input(s): DDIMER in the last 72 hours. Hgb A1c No results for input(s): HGBA1C in the last 72 hours. Lipid Profile No results for input(s): CHOL, HDL, LDLCALC, TRIG, CHOLHDL, LDLDIRECT in the last 72 hours. Thyroid function studies No results for input(s): TSH, T4TOTAL, T3FREE, THYROIDAB in the last 72 hours.  Invalid input(s): FREET3 Anemia work up No results for input(s): VITAMINB12, FOLATE, FERRITIN, TIBC, IRON, RETICCTPCT in the last 72 hours. Urinalysis    Component Value Date/Time   COLORURINE YELLOW (A) 03/24/2020 1521   APPEARANCEUR CLEAR (A) 03/24/2020 1521   APPEARANCEUR Clear 10/12/2017 1410   LABSPEC 1.005 03/24/2020 1521   LABSPEC 1.003 11/24/2013 2117   PHURINE 7.0 03/24/2020 1521   GLUCOSEU NEGATIVE 03/24/2020 1521   GLUCOSEU Negative 11/24/2013 2117   HGBUR NEGATIVE 03/24/2020 1521   BILIRUBINUR NEGATIVE 03/24/2020 1521   BILIRUBINUR Negative 10/12/2017 1410   BILIRUBINUR Negative 11/24/2013 2117   KETONESUR NEGATIVE 03/24/2020 1521   PROTEINUR NEGATIVE 03/24/2020 1521   NITRITE NEGATIVE 03/24/2020 1521   LEUKOCYTESUR NEGATIVE 03/24/2020 1521   LEUKOCYTESUR Negative 11/24/2013 2117   Sepsis Labs Invalid input(s): PROCALCITONIN,  WBC,  LACTICIDVEN Microbiology Recent Results (from the past 240 hour(s))  Resp Panel by RT-PCR (Flu A&B, Covid) Nasopharyngeal Swab     Status: None   Collection Time: 10/29/20  1:22 PM   Specimen: Nasopharyngeal Swab; Nasopharyngeal(NP) swabs  in vial transport medium   Result Value Ref Range Status   SARS Coronavirus 2 by RT PCR NEGATIVE NEGATIVE Final    Comment: (NOTE) SARS-CoV-2 target nucleic acids are NOT DETECTED.  The SARS-CoV-2 RNA is generally detectable in upper respiratory specimens during the acute phase of infection. The lowest concentration of SARS-CoV-2 viral copies this assay can detect is 138 copies/mL. A negative result does not preclude SARS-Cov-2 infection and should not be used as the sole basis for treatment or other patient management decisions. A negative result may occur with  improper specimen collection/handling, submission of specimen other than nasopharyngeal swab, presence of viral mutation(s) within the areas targeted by this assay, and inadequate number of viral copies(<138 copies/mL). A negative result must be combined with clinical observations, patient history, and epidemiological information. The expected result is Negative.  Fact Sheet for Patients:  EntrepreneurPulse.com.au  Fact Sheet for Healthcare Providers:  IncredibleEmployment.be  This test is no t yet approved or cleared by the [place] FDA and  has been authorized for detection and/or diagnosis of SARS-CoV-2 by FDA under an Emergency Use Authorization (EUA). This EUA will remain  in effect (meaning this test can be used) for the duration of the COVID-19 declaration under Section 564(b)(1) of the Act, 21 U.S.C.section 360bbb-3(b)(1), unless the authorization is terminated  or revoked sooner.       Influenza A by PCR NEGATIVE NEGATIVE Final   Influenza B by PCR NEGATIVE NEGATIVE Final    Comment: (NOTE) The Xpert Xpress SARS-CoV-2/FLU/RSV plus assay is intended as an aid in the diagnosis of influenza from Nasopharyngeal swab specimens and should not be used as a sole basis for treatment. Nasal washings and aspirates are unacceptable for Xpert Xpress SARS-CoV-2/FLU/RSV testing.  Fact Sheet for  Patients: EntrepreneurPulse.com.au  Fact Sheet for Healthcare Providers: IncredibleEmployment.be  This test is not yet approved or cleared by the [place] FDA and has been authorized for detection and/or diagnosis of SARS-CoV-2 by FDA under an Emergency Use Authorization (EUA). This EUA will remain in effect (meaning this test can be used) for the duration of the COVID-19 declaration under Section 564(b)(1) of the Act, 21 U.S.C. section 360bbb-3(b)(1), unless the authorization is terminated or revoked.  Performed at [facility], [place], [place] [address]   SARS CORONAVIRUS 2 (TAT 6-24 HRS) Nasopharyngeal Nasopharyngeal Swab     Status: None   Collection Time: 11/03/20  2:42 PM   Specimen: Nasopharyngeal Swab  Result Value Ref Range Status   SARS Coronavirus 2 NEGATIVE NEGATIVE Final    Comment: (NOTE) SARS-CoV-2 target nucleic acids are NOT DETECTED.  The SARS-CoV-2 RNA is generally detectable in upper and lower respiratory specimens during the acute phase of infection. Negative results do not preclude SARS-CoV-2 infection, do not rule out co-infections with other pathogens, and should not be used as the sole basis for treatment or other patient management decisions. Negative results must be combined with clinical observations, patient history, and epidemiological information. The expected result is Negative.  Fact Sheet for Patients: SugarRoll.be  Fact Sheet for Healthcare Providers: https://www.woods-mathews.com/  This test is not yet approved or cleared by the [place] FDA and  has been authorized for detection and/or diagnosis of SARS-CoV-2 by FDA under an Emergency Use Authorization (EUA). This EUA will remain  in effect (meaning this test can be used) for the duration of the COVID-19 declaration under Se ction 564(b)(1) of the Act, 21 U.S.C. section  360bbb-3(b)(1), unless the authorization is  terminated or revoked sooner.  Performed at [facility], [place] [address], [place], [place] [address]      Time coordinating discharge: 73mins  SIGNED:   Kathie Dike, MD  [facility] 11/04/2020, 11:30 AM   If 7PM-7AM, please contact night-coverage [email or web address]

## 2020-11-04 NOTE — Progress Notes (Signed)
Patient discharged to [facility] by first choice. Scripts signed and sent in discharge packet. Report called to [facility], LPN at facility. IV removed. All belongings sent with patient.

## 2020-11-04 NOTE — Plan of Care (Signed)

## 2020-11-04 NOTE — TOC Progression Note (Signed)
Transition of Care [facility]) - Progression Note    Patient Details  Name: Kerri Carter MRN: [id number] Date of Birth: [birth year]  Transition of Care [facility]) CM/SW [place], RN Phone Number: 11/04/2020, 9:28 AM  Clinical Narrative:   Received notice that the ins auth was approved Auth ID [id number], Covid test Negative, Will DC to [facility] today    Expected Discharge Plan: [place] Barriers to Discharge: Continued Medical Work up  Expected Discharge Plan and Services Expected Discharge Plan: [place]   Discharge Planning Services: CM Consult   Living arrangements for the past 2 months: Single Family Home                 DME Arranged: N/A         HH Arranged: PT HH Agency: [place] Date [place]: 10/31/20 Time [place]: [id number] Representative spoke with at [place]: [place]   Social Determinants of Health ([place]) Interventions    Readmission Risk Interventions No flowsheet data found.

## 2020-11-04 NOTE — Care Management Important Message (Signed)
Important Message  Patient Details  Name: Kerri Carter MRN: [id number] Date of Birth: Jan 01, 1941   Medicare Important Message Given:  Yes     Loann Quill 11/04/2020, 2:21 PM

## 2020-11-04 NOTE — Progress Notes (Signed)
As is documented in my original note the chest pain the patient having was suggestive of aortic dissection.  Patient had a low GFR and an allergy to IV contrast.  She seemed to be stable therefore in an effort to get a reasonably fast and risk free evaluation of the patient's aorta I ordered an MRA of the chest to avoid any problems with contrast allergy or worsening of her GFR.  This was done without contrast or ordered without contrast.  I always try to take the best care possible of my patients and get the lowest risk tests.

## 2020-11-05 ENCOUNTER — Inpatient Hospital Stay: Admission: RE | Admit: 2020-11-05 | Payer: Medicare PPO | Source: Ambulatory Visit

## 2020-11-05 DIAGNOSIS — J849 Interstitial pulmonary disease, unspecified: Secondary | ICD-10-CM | POA: Diagnosis not present

## 2020-11-05 DIAGNOSIS — F325 Major depressive disorder, single episode, in full remission: Secondary | ICD-10-CM | POA: Diagnosis not present

## 2020-11-05 DIAGNOSIS — J441 Chronic obstructive pulmonary disease with (acute) exacerbation: Secondary | ICD-10-CM | POA: Diagnosis not present

## 2020-11-05 DIAGNOSIS — M0579 Rheumatoid arthritis with rheumatoid factor of multiple sites without organ or systems involvement: Secondary | ICD-10-CM | POA: Diagnosis not present

## 2020-11-11 ENCOUNTER — Other Ambulatory Visit: Payer: Medicare PPO

## 2020-11-14 ENCOUNTER — Telehealth: Payer: Self-pay | Admitting: Pulmonary Disease

## 2020-11-14 NOTE — Telephone Encounter (Signed)
Spoke to patient, who is requesting an appt prior to 12/11/2020. She is scheduled for back surgery on 12/11/2020 and clearance is needed prior. She had a recent admission.  Patient is aware that Dr. Patsey Berthold does not have availability prior to 12/11/2020. She is agreeable with seeing NP. Appt scheduled 12/04/2020 with Kerri Carter. Nothing further needed at this time.

## 2020-11-15 ENCOUNTER — Telehealth: Payer: Self-pay | Admitting: Internal Medicine

## 2020-11-15 NOTE — Telephone Encounter (Signed)
Patient calling in and states she needs a referral to psychiatry. States who she sees no longer wants her as a Patient.   Used to see [facility] discharged her as a Patient.  Needing a new referral and states that the referral coordinator has been working on this for her.   Please advise? Does she need a new referral to a [place] psychiatrist?

## 2020-11-17 DIAGNOSIS — J449 Chronic obstructive pulmonary disease, unspecified: Secondary | ICD-10-CM | POA: Diagnosis not present

## 2020-11-17 DIAGNOSIS — M069 Rheumatoid arthritis, unspecified: Secondary | ICD-10-CM | POA: Diagnosis not present

## 2020-11-17 DIAGNOSIS — D631 Anemia in chronic kidney disease: Secondary | ICD-10-CM | POA: Diagnosis not present

## 2020-11-17 DIAGNOSIS — N189 Chronic kidney disease, unspecified: Secondary | ICD-10-CM | POA: Diagnosis not present

## 2020-11-17 DIAGNOSIS — I130 Hypertensive heart and chronic kidney disease with heart failure and stage 1 through stage 4 chronic kidney disease, or unspecified chronic kidney disease: Secondary | ICD-10-CM | POA: Diagnosis not present

## 2020-11-17 DIAGNOSIS — E785 Hyperlipidemia, unspecified: Secondary | ICD-10-CM | POA: Diagnosis not present

## 2020-11-17 DIAGNOSIS — I509 Heart failure, unspecified: Secondary | ICD-10-CM | POA: Diagnosis not present

## 2020-11-17 DIAGNOSIS — F32A Depression, unspecified: Secondary | ICD-10-CM | POA: Diagnosis not present

## 2020-11-17 DIAGNOSIS — I2510 Atherosclerotic heart disease of native coronary artery without angina pectoris: Secondary | ICD-10-CM | POA: Diagnosis not present

## 2020-11-19 ENCOUNTER — Telehealth: Payer: Self-pay

## 2020-11-19 DIAGNOSIS — M810 Age-related osteoporosis without current pathological fracture: Secondary | ICD-10-CM | POA: Diagnosis not present

## 2020-11-19 DIAGNOSIS — Z79899 Other long term (current) drug therapy: Secondary | ICD-10-CM | POA: Diagnosis not present

## 2020-11-19 DIAGNOSIS — M0579 Rheumatoid arthritis with rheumatoid factor of multiple sites without organ or systems involvement: Secondary | ICD-10-CM | POA: Diagnosis not present

## 2020-11-19 NOTE — Progress Notes (Incomplete)
11/19/20 6:14 PM   Kerri Carter Kerri Carter [birth year] [id number]  Referring provider:  McLean-Scocuzza, Nino Glow, MD [place],  [place] [address] No chief complaint on file.    HPI: Kerri Carter is a 80 y.o.female with a personal history of left renal mass, urinary frequency, urge incontinence and CKD III, who presents today for 1 year follow-up with [facility].   03/2016 CT of abdomen pelvis revealed multiple hypodensities too small to characterize as well as a possible area of suspicious heterogeneity within the lower pole. Serial ultrasound scan then has been unremarkable.   RUS on 10/14/2018 was unremarkable.   CT pelvis w/o contrast from 02/21/2019 noted no signs of fracture. Cystic left adnexal lesion has enlarged since 2018 where it measured approximately 1.8 x 1.7 cm. This is not well assessed due to streak artifact as well as lack of contrast.     She has tried and failed Myrbetriq 25 mg, and was started on Mybetriq 50 mg.        PMH: Past Medical History:  Diagnosis Date   Anxiety    Asthma    Bipolar disorder ([place])    C. difficile diarrhea    from Abx 2010 hospitalized    Cataract    s/p b/l repair    Chicken pox    CKD (chronic kidney disease)    CKD (chronic kidney disease), stage III (HCC)    a. s/p R nephrectomy./ aneurysm   Conversion disorder    COPD (chronic obstructive pulmonary disease) (HCC)    Depression    Essential hypertension    GERD (gastroesophageal reflux disease)    Hyperlipidemia    ILD (interstitial lung disease) (HCC)    mild; 2/2 RA diagnosis   Inflammatory arthritis    a. hands/carpal tunnel.  b. Low titer rheumatoid factor. c. Negative anti-CCP antibodies. d. Plaquenil.   Macular degeneration    Nocturnal hypoxemia    Non-Obstructive CAD    a. 07/2009 Cath ([facility]): nonobs dzs;  b. 03/2011 Cath [facility]): nonobs dzs.   Osteoarthritis    a. Knees.   PAD (peripheral artery disease) (HCC)    PUD (peptic ulcer disease)    S/P right  hip fracture    11/01/16 s/p repair   Shoulder pain    Sleep apnea    no cpap / minimal   Spinal stenosis at L4-L5 level    severe with L4/L5 anterolisthesis grade 1 anterolisthesis    Toxic maculopathy    Valvular heart disease    a. 07/2015 Echo: EF 55-60%, Mild AI, AS, MR, and TR.    Surgical History: Past Surgical History:  Procedure Laterality Date   APPENDECTOMY     BACK SURGERY     lumbar fusion   BUNIONECTOMY Right    CATARACT EXTRACTION, BILATERAL     CESAREAN SECTION     x1   CHOLECYSTECTOMY N/A 05/11/2016   Procedure: LAPAROSCOPIC CHOLECYSTECTOMY;  Surgeon: Florene Glen, MD;  Location: [facility] ORS;  Service: General;  Laterality: N/A;   COLONOSCOPY WITH PROPOFOL N/A 04/02/2016   Procedure: COLONOSCOPY WITH PROPOFOL;  Surgeon: Jonathon Bellows, MD;  Location: [facility] ENDOSCOPY;  Service: Endoscopy;  Laterality: N/A;   ENDOSCOPIC RETROGRADE CHOLANGIOPANCREATOGRAPHY (ERCP) WITH PROPOFOL N/A 05/08/2016   Procedure: ENDOSCOPIC RETROGRADE CHOLANGIOPANCREATOGRAPHY (ERCP) WITH PROPOFOL;  Surgeon: Lucilla Lame, MD;  Location: [facility] ENDOSCOPY;  Service: Endoscopy;  Laterality: N/A;   ERCP     with biliary spincterotomy 05/08/16 Dr. Allen Norris for choledocholithiasis    ESOPHAGEAL DILATION  04/02/2016   Procedure: ESOPHAGEAL DILATION;  Surgeon: Jonathon Bellows, MD;  Location: [facility] ENDOSCOPY;  Service: Endoscopy;;   ESOPHAGOGASTRODUODENOSCOPY (EGD) WITH PROPOFOL N/A 04/02/2016   Procedure: ESOPHAGOGASTRODUODENOSCOPY (EGD) WITH PROPOFOL;  Surgeon: Jonathon Bellows, MD;  Location: [facility] ENDOSCOPY;  Service: Endoscopy;  Laterality: N/A;   HIP ARTHROPLASTY Right 11/01/2016   Procedure: ARTHROPLASTY BIPOLAR HIP (HEMIARTHROPLASTY);  Surgeon: Corky Mull, MD;  Location: [facility] ORS;  Service: Orthopedics;  Laterality: Right;   NEPHRECTOMY  1988   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   ORIF SCAPHOID FRACTURE Left 12/21/2019   Procedure: OPEN REDUCTION INTERNAL FIXATION (ORIF) OF LEFT SCAPULAR NONUNION  WITH BONE GRAFT;  Surgeon: Corky Mull, MD;  Location: [facility] ORS;  Service: Orthopedics;  Laterality: Left;   osteoporosis     noted DEXA 08/19/16    REPLACEMENT TOTAL KNEE Right    REVERSE SHOULDER ARTHROPLASTY Right 11/04/2017   Procedure: REVERSE SHOULDER ARTHROPLASTY;  Surgeon: Corky Mull, MD;  Location: [facility] ORS;  Service: Orthopedics;  Laterality: Right;   REVERSE SHOULDER ARTHROPLASTY Left 07/26/2018   Procedure: REVERSE SHOULDER ARTHROPLASTY;  Surgeon: Corky Mull, MD;  Location: [facility] ORS;  Service: Orthopedics;  Laterality: Left;   TONSILLECTOMY     TOTAL HIP ARTHROPLASTY  12/10/11   [facility] left hip   TOTAL HIP ARTHROPLASTY Bilateral    TUBAL LIGATION      Home Medications:  Allergies as of 11/20/2020       Reactions   Ceftin [cefuroxime Axetil] Anaphylaxis   Lisinopril Anaphylaxis   Sulfa Antibiotics Other (See Comments)   Face swelling   Sulfasalazine Anaphylaxis   Morphine Other (See Comments)   Per patient, low blood pressure issues that requires action to raise it back up. Can take small infrequent doses   Xarelto [rivaroxaban] Other (See Comments)   Stomach burning, bleeding, and tar in stool   Adhesive [tape] Rash   Paper tape and tega derm OK   Antihistamines, Chlorpheniramine-type Other (See Comments)   Makes pt hyper   Antivert [meclizine Hcl] Other (See Comments)   Bladder will not empty   Aspirin Other (See Comments)   Sulfasalazine allergy cross reacts   Contrast Media [iodinated Diagnostic Agents] Rash   she is able to use betadine scrubs.   Decongestant [pseudoephedrine Hcl] Other (See Comments)   Makes pt hyper   Doxycycline Other (See Comments)   GI upset   Levaquin [levofloxacin In D5w] Rash   Polymyxin B Other (See Comments)   Facial rash   Tetanus Toxoids Rash, Other (See Comments)   Fever and hot to touch at injection site        Medication List        Accurate as of November 19, 2020  6:14 PM. If you have any questions, ask your  nurse or doctor.          Adalimumab 40 MG/0.4ML Pnkt Inject 40 mg into the skin every 14 (fourteen) days.   albuterol 108 (90 Base) MCG/ACT inhaler Commonly known as: Ventolin HFA Inhale 2 puffs into the lungs every 6 (six) hours as needed for wheezing or shortness of breath.   ALPRAZolam 0.25 MG tablet Commonly known as: XANAX TAKE 1 TABLET BY MOUTH AT BEDTIME AS NEEDED FOR ANXIETY.   amLODipine 2.5 MG tablet Commonly known as: NORVASC Take 1 tablet (2.5 mg total) by mouth daily.   benzonatate 200 MG capsule Commonly known as: TESSALON Take 1 capsule (200 mg total) by mouth 3 (  three) times daily as needed for cough.   budesonide 0.25 MG/2ML nebulizer solution Commonly known as: PULMICORT USE 1 VIAL  IN  NEBULIZER TWICE  DAILY - rinse mouth after treatment   cyclobenzaprine 10 MG tablet Commonly known as: FLEXERIL Take 1 tablet (10 mg total) by mouth 3 (three) times daily as needed for muscle spasms.   dicyclomine 10 MG capsule Commonly known as: BENTYL Take 1 capsule (10 mg total) by mouth 2 (two) times daily as needed for spasms.   escitalopram 10 MG tablet Commonly known as: LEXAPRO TAKE 1 TABLET BY MOUTH EVERY DAY   famotidine 20 MG tablet Commonly known as: PEPCID TAKE 1 TABLET (20 MG TOTAL) BY MOUTH DAILY. BEFORE BREAKFAST OR DINNER   fluticasone 50 MCG/ACT nasal spray Commonly known as: Flonase Place 2 sprays into both nostrils daily. In am   gabapentin 300 MG capsule Commonly known as: NEURONTIN Take 2 capsules (600 mg total) by mouth in the morning and at bedtime.   guaiFENesin 600 MG 12 hr tablet Commonly known as: MUCINEX Take 1 tablet (600 mg total) by mouth 2 (two) times daily.   HYDROcodone-acetaminophen 5-325 MG tablet Commonly known as: NORCO/VICODIN Take 1 tablet by mouth every 6 (six) hours as needed for moderate pain.   ipratropium-albuterol 0.5-2.5 (3) MG/3ML Soln Commonly known as: DUONEB TAKE 3 MLS BY NEBULIZATION 3 (THREE) TIMES  DAILY AS NEEDED.   lamoTRIgine 100 MG tablet Commonly known as: LAMICTAL TAKE 1 TAB 2 TIMES DAILY. FURTHER REFILLS NEW PSYCH FOR ALL PSYCH MEDS ONLY TEMP SUPPLY FROM PCP   leflunomide 20 MG tablet Commonly known as: ARAVA Take 1 tablet (20 mg total) by mouth daily.   lovastatin 20 MG tablet Commonly known as: MEVACOR TAKE 1 TABLET BY MOUTH EVERYDAY AT BEDTIME *STOP TALKING 40MG *   montelukast 10 MG tablet Commonly known as: SINGULAIR TAKE 1 TABLET BY MOUTH EVERY DAY   Myrbetriq 50 MG Tb24 tablet Generic drug: mirabegron ER TAKE 1 TABLET BY MOUTH EVERYDAY AT BEDTIME   nebivolol 10 MG tablet Commonly known as: Bystolic Take 0.5 tablets (5 mg total) by mouth in the morning and at bedtime.   pantoprazole 40 MG tablet Commonly known as: PROTONIX TAKE 1 TABLET BY MOUTH 2 TIMES DAILY 30 MIN BEFORE FOOD (NOTE REDUCTION IN FREQUENCY)   predniSONE 5 MG tablet Commonly known as: DELTASONE Take 40mg  po daily for 2 days then 30mg  daily for 2 days then 20mg  daily for 2 days then 10mg  daily for 2 days then 7.5mg  daily   QUEtiapine 25 MG tablet Commonly known as: SEROQUEL TAKE 1 TABLET (25 MG TOTAL) BY MOUTH AT BEDTIME. AGAIN LAST FILL FURTHER REFILLS FROM PSYCHIATRY NO EXCEPTIONS   sucralfate 1 g tablet Commonly known as: CARAFATE Take 1 tablet (1 g total) by mouth 2 (two) times daily.   zoledronic acid 5 MG/100ML Soln injection Commonly known as: RECLAST Inject 5 mg into the vein once.        Allergies:  Allergies  Allergen Reactions   Ceftin [Cefuroxime Axetil] Anaphylaxis   Lisinopril Anaphylaxis   Sulfa Antibiotics Other (See Comments)    Face swelling   Sulfasalazine Anaphylaxis   Morphine Other (See Comments)    Per patient, low blood pressure issues that requires action to raise it back up. Can take small infrequent doses   Xarelto [Rivaroxaban] Other (See Comments)    Stomach burning, bleeding, and tar in stool   Adhesive [Tape] Rash    Paper tape and tega  derm OK   Antihistamines, Chlorpheniramine-Type Other (See Comments)    Makes pt hyper   Antivert [Meclizine Hcl] Other (See Comments)    Bladder will not empty   Aspirin Other (See Comments)    Sulfasalazine allergy cross reacts   Contrast Media [Iodinated Diagnostic Agents] Rash    she is able to use betadine scrubs.   Decongestant [Pseudoephedrine Hcl] Other (See Comments)    Makes pt hyper   Doxycycline Other (See Comments)    GI upset   Levaquin [Levofloxacin In D5w] Rash   Polymyxin B Other (See Comments)    Facial rash   Tetanus Toxoids Rash and Other (See Comments)    Fever and hot to touch at injection site    Family History: Family History  Problem Relation Age of Onset   Rheum arthritis Mother    Asthma Mother    Parkinson's disease Mother    Heart disease Mother    Stroke Mother    Hypertension Mother    Heart attack Father    Heart disease Father    Hypertension Father    Peripheral Artery Disease Father    Diabetes Son    Gout Son    Asthma Sister    Heart disease Sister    Lung cancer Sister    Heart disease Sister    Heart disease Sister    Breast cancer Sister    Heart attack Sister    Heart disease Brother    Heart disease Maternal Grandmother    Diabetes Maternal Grandmother    Colon cancer Maternal Grandmother    Cancer Maternal Grandmother        Hodgkins lymphoma   Heart disease Brother    Alcohol abuse Brother    Depression Brother    Dementia Son     Social History:  reports that she quit smoking about 46 years ago. Her smoking use included cigarettes. She has a 10.00 pack-year smoking history. She has never used smokeless tobacco. She reports that she does not drink alcohol and does not use drugs.   Physical Exam: There were no vitals taken for this visit.  Constitutional:  Alert and oriented, No acute distress. HEENT: [place] AT, moist mucus membranes.  Trachea midline, no masses. Cardiovascular: No clubbing, cyanosis, or  edema. Respiratory: Normal respiratory effort, no increased work of breathing. Skin: No rashes, bruises or suspicious lesions. Neurologic: Grossly intact, no focal deficits, moving all 4 extremities. Psychiatric: Normal mood and affect.  Laboratory Data:  Lab Results  Component Value Date   CREATININE 1.27 (H) 11/03/2020    Lab Results  Component Value Date   HGBA1C 6.1 (H) 10/30/2020    Urinalysis   Pertinent Imaging: PVR**   Assessment & Plan:     No follow-ups on file.  I,Kailey Littlejohn,acting as a Education administrator for Hollice Espy, MD.,have documented all relevant documentation on the behalf of Hollice Espy, MD,as directed by  Hollice Espy, MD while in the presence of Hollice Espy, [place] [address], [place], [place] [address] [phone]

## 2020-11-19 NOTE — Telephone Encounter (Signed)
Transition Care Management Unsuccessful Follow-up Telephone Call  Date of discharge and from where:  11/16/20 - [facility] 11/04/20- [facility]   Attempts:  1st Attempt  Reason for unsuccessful TCM follow-up call:  No answer/busy. HFU scheduled 11/22/20 @ 2:30. Will follow.

## 2020-11-20 ENCOUNTER — Emergency Department
Admission: EM | Admit: 2020-11-20 | Discharge: 2020-11-20 | Disposition: A | Discharge status: Home / Self Care | Payer: Medicare PPO | Attending: Emergency Medicine | Admitting: Emergency Medicine

## 2020-11-20 ENCOUNTER — Emergency Department: Payer: Medicare PPO

## 2020-11-20 ENCOUNTER — Encounter: Payer: Self-pay | Admitting: Emergency Medicine

## 2020-11-20 ENCOUNTER — Encounter: Payer: Self-pay | Admitting: Urology

## 2020-11-20 ENCOUNTER — Ambulatory Visit: Payer: Self-pay | Admitting: Urology

## 2020-11-20 ENCOUNTER — Other Ambulatory Visit: Payer: Self-pay

## 2020-11-20 DIAGNOSIS — R519 Headache, unspecified: Secondary | ICD-10-CM | POA: Diagnosis not present

## 2020-11-20 DIAGNOSIS — S199XXA Unspecified injury of neck, initial encounter: Secondary | ICD-10-CM | POA: Diagnosis not present

## 2020-11-20 DIAGNOSIS — R531 Weakness: Secondary | ICD-10-CM | POA: Diagnosis not present

## 2020-11-20 DIAGNOSIS — N183 Chronic kidney disease, stage 3 unspecified: Secondary | ICD-10-CM | POA: Diagnosis not present

## 2020-11-20 DIAGNOSIS — I129 Hypertensive chronic kidney disease with stage 1 through stage 4 chronic kidney disease, or unspecified chronic kidney disease: Secondary | ICD-10-CM | POA: Insufficient documentation

## 2020-11-20 DIAGNOSIS — R5381 Other malaise: Secondary | ICD-10-CM | POA: Diagnosis not present

## 2020-11-20 DIAGNOSIS — M542 Cervicalgia: Secondary | ICD-10-CM | POA: Insufficient documentation

## 2020-11-20 DIAGNOSIS — Z79899 Other long term (current) drug therapy: Secondary | ICD-10-CM | POA: Insufficient documentation

## 2020-11-20 DIAGNOSIS — M47812 Spondylosis without myelopathy or radiculopathy, cervical region: Secondary | ICD-10-CM | POA: Diagnosis not present

## 2020-11-20 DIAGNOSIS — M545 Low back pain, unspecified: Secondary | ICD-10-CM | POA: Insufficient documentation

## 2020-11-20 DIAGNOSIS — M1712 Unilateral primary osteoarthritis, left knee: Secondary | ICD-10-CM | POA: Diagnosis not present

## 2020-11-20 DIAGNOSIS — Z743 Need for continuous supervision: Secondary | ICD-10-CM | POA: Diagnosis not present

## 2020-11-20 DIAGNOSIS — Z87891 Personal history of nicotine dependence: Secondary | ICD-10-CM | POA: Insufficient documentation

## 2020-11-20 DIAGNOSIS — M25562 Pain in left knee: Secondary | ICD-10-CM | POA: Diagnosis not present

## 2020-11-20 DIAGNOSIS — W19XXXA Unspecified fall, initial encounter: Secondary | ICD-10-CM | POA: Diagnosis not present

## 2020-11-20 DIAGNOSIS — M419 Scoliosis, unspecified: Secondary | ICD-10-CM | POA: Diagnosis not present

## 2020-11-20 DIAGNOSIS — M25552 Pain in left hip: Secondary | ICD-10-CM | POA: Diagnosis not present

## 2020-11-20 DIAGNOSIS — J984 Other disorders of lung: Secondary | ICD-10-CM | POA: Diagnosis not present

## 2020-11-20 DIAGNOSIS — Z043 Encounter for examination and observation following other accident: Secondary | ICD-10-CM | POA: Diagnosis not present

## 2020-11-20 DIAGNOSIS — S0990XA Unspecified injury of head, initial encounter: Secondary | ICD-10-CM | POA: Diagnosis not present

## 2020-11-20 NOTE — ED Provider Notes (Signed)
[facility] Emergency Department Provider Note   ____________________________________________   Event Date/Time   First MD Initiated Contact with Patient 11/20/20 1540     (approximate)  I have reviewed the triage vital signs and the nursing notes.   HISTORY  Chief Complaint Fall    HPI Kerri Carter is a 80 y.o. female who reports she had just gotten some chips out of the pantry went to turn around and thinks her legs might of gotten tangled up in the walker but she fell down.  She hit her head.  She does not have any history of loss of consciousness.  She complained of the pain in the head and neck and low back.  CTs of those were done and were negative she also had some pain in the left knee which is still present but somewhat better.  She has a history of low back pain and sciatica as well on the left.  This is unchanged.         Past Medical History:  Diagnosis Date   Anxiety    Asthma    Bipolar disorder ([place])    C. difficile diarrhea    from Abx 2010 hospitalized    Cataract    s/p b/l repair    Chicken pox    CKD (chronic kidney disease)    CKD (chronic kidney disease), stage III (HCC)    a. s/p R nephrectomy./ aneurysm   Conversion disorder    COPD (chronic obstructive pulmonary disease) (HCC)    Depression    Essential hypertension    GERD (gastroesophageal reflux disease)    Hyperlipidemia    ILD (interstitial lung disease) (HCC)    mild; 2/2 RA diagnosis   Inflammatory arthritis    a. hands/carpal tunnel.  b. Low titer rheumatoid factor. c. Negative anti-CCP antibodies. d. Plaquenil.   Macular degeneration    Nocturnal hypoxemia    Non-Obstructive CAD    a. 07/2009 Cath ([facility]): nonobs dzs;  b. 03/2011 Cath [facility]): nonobs dzs.   Osteoarthritis    a. Knees.   PAD (peripheral artery disease) (HCC)    PUD (peptic ulcer disease)    S/P right hip fracture    11/01/16 s/p repair   Shoulder pain    Sleep apnea    no cpap /  minimal   Spinal stenosis at L4-L5 level    severe with L4/L5 anterolisthesis grade 1 anterolisthesis    Toxic maculopathy    Valvular heart disease    a. 07/2015 Echo: EF 55-60%, Mild AI, AS, MR, and TR.    Patient Active Problem List   Diagnosis Date Noted   COPD exacerbation ([place]) 10/29/2020   Acromial process of scapula fracture 12/21/2019   NSIP (nonspecific interstitial pneumonitis) ([place]) 12/08/2019   Iliopsoas bursitis of right hip 08/29/2019   Chronic left shoulder pain 08/29/2019   Overweight (BMI 25.0-29.9) 08/29/2019   Hypertriglyceridemia 07/24/2019   Bipolar disorder, in full remission, most recent episode depressed ([place]) 07/13/2019   Essential hypertension 06/16/2019   Chronic pain 06/16/2019   Lymphedema 06/05/2019   Chronic venous insufficiency 05/25/2019   PAD (peripheral artery disease) ([place]) 05/25/2019   Leg edema 05/02/2019   Episodic mood disorder ([place]) 97/35/3299   Complicated grief 24/26/8341   At high risk for falls 03/16/2019   Pelvic mass in female 03/16/2019   Compression fracture of L4 vertebra ([place]) 02/21/2019   Collapsed vertebra, not elsewhere classified, lumbar region, initial encounter for fracture ([place]) 02/21/2019  Anxiety 01/20/2019   Pain due to onychomycosis of toenails of both feet 12/05/2018   Hav (hallux abducto valgus), unspecified laterality 12/05/2018   Closed fracture of acromial process of scapula 10/28/2018   Status post reverse arthroplasty of shoulder, left 07/26/2018   Interstitial pulmonary disease ([place]) 07/07/2018   Fatigue 07/07/2018   Avascular necrosis of left shoulder due to adverse effect of steroid therapy ([place]) 01/20/2018   Other shoulder lesions, left shoulder 12/20/2017   Status post reverse total shoulder replacement, right 11/04/2017   Leukocytosis 10/19/2017   Allergic rhinitis 09/28/2017   Rotator cuff tendinitis, right 07/26/2017   Strain of right hip 02/26/2017   History of fracture of left hip 01/15/2017    Status post lumbar spinal fusion 01/15/2017   Dislocation of hip joint prosthesis ([place]) 01/08/2017   Status post hip hemiarthroplasty 01/08/2017   Leg swelling 12/15/2016   Age-related osteoporosis without current pathological fracture 11/05/2016   Hip fracture ([place]) 10/31/2016   Polyneuropathy 10/28/2016   Left foot pain 10/19/2016   Spinal stenosis of lumbar region 09/15/2016   Osteoporosis 08/27/2016   Drug-induced osteoporosis 08/27/2016   Adnexal mass 08/25/2016   Prediabetes 08/25/2016   Coronary atherosclerosis 08/25/2016   History of syncope 08/25/2016   Hypokalemia 08/25/2016   Mixed bipolar I disorder ([place]) 10/09/2015   Stage 3b chronic kidney disease ([place])    Conversion disorder 08/04/2015   Hyperlipidemia 07/17/2015   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   Macular degeneration 07/17/2015   Insomnia 07/17/2015   Rheumatoid arthritis ([place]) 12/05/2014   Other specified postprocedural states 07/20/2013   Anxiety and depression 06/13/2013   Osteoarthritis of knee 06/07/2013   Cough 05/02/2012   Valvular heart disease 10/13/2011   Asthma-COPD overlap syndrome ([place]) 09/09/2011   OSA (obstructive sleep apnea) 09/09/2011   Nocturnal hypoxemia 09/09/2011   Gastroesophageal reflux disease 02/24/2011   Single kidney 02/24/2011   H/O cardiac catheterization 02/24/2011   Renal artery stenosis ([place]) 02/24/2011    Past Surgical History:  Procedure Laterality Date   APPENDECTOMY     BACK SURGERY     lumbar fusion   BUNIONECTOMY Right    CATARACT EXTRACTION, BILATERAL     CESAREAN SECTION     x1   CHOLECYSTECTOMY N/A 05/11/2016   Procedure: LAPAROSCOPIC CHOLECYSTECTOMY;  Surgeon: Florene Glen, MD;  Location: [facility] ORS;  Service: General;  Laterality: N/A;   COLONOSCOPY WITH PROPOFOL N/A 04/02/2016   Procedure: COLONOSCOPY WITH PROPOFOL;  Surgeon: Jonathon Bellows, MD;  Location: [facility] ENDOSCOPY;  Service: Endoscopy;  Laterality: N/A;   ENDOSCOPIC  RETROGRADE CHOLANGIOPANCREATOGRAPHY (ERCP) WITH PROPOFOL N/A 05/08/2016   Procedure: ENDOSCOPIC RETROGRADE CHOLANGIOPANCREATOGRAPHY (ERCP) WITH PROPOFOL;  Surgeon: Lucilla Lame, MD;  Location: [facility] ENDOSCOPY;  Service: Endoscopy;  Laterality: N/A;   ERCP     with biliary spincterotomy 05/08/16 Dr. Allen Norris for choledocholithiasis    ESOPHAGEAL DILATION  04/02/2016   Procedure: ESOPHAGEAL DILATION;  Surgeon: Jonathon Bellows, MD;  Location: [facility] ENDOSCOPY;  Service: Endoscopy;;   ESOPHAGOGASTRODUODENOSCOPY (EGD) WITH PROPOFOL N/A 04/02/2016   Procedure: ESOPHAGOGASTRODUODENOSCOPY (EGD) WITH PROPOFOL;  Surgeon: Jonathon Bellows, MD;  Location: [facility] ENDOSCOPY;  Service: Endoscopy;  Laterality: N/A;   HIP ARTHROPLASTY Right 11/01/2016   Procedure: ARTHROPLASTY BIPOLAR HIP (HEMIARTHROPLASTY);  Surgeon: Corky Mull, MD;  Location: [facility] ORS;  Service: Orthopedics;  Laterality: Right;   NEPHRECTOMY  1988   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   ORIF SCAPHOID FRACTURE Left 12/21/2019   Procedure: OPEN  REDUCTION INTERNAL FIXATION (ORIF) OF LEFT SCAPULAR NONUNION WITH BONE GRAFT;  Surgeon: Corky Mull, MD;  Location: [facility] ORS;  Service: Orthopedics;  Laterality: Left;   osteoporosis     noted DEXA 08/19/16    REPLACEMENT TOTAL KNEE Right    REVERSE SHOULDER ARTHROPLASTY Right 11/04/2017   Procedure: REVERSE SHOULDER ARTHROPLASTY;  Surgeon: Corky Mull, MD;  Location: [facility] ORS;  Service: Orthopedics;  Laterality: Right;   REVERSE SHOULDER ARTHROPLASTY Left 07/26/2018   Procedure: REVERSE SHOULDER ARTHROPLASTY;  Surgeon: Corky Mull, MD;  Location: [facility] ORS;  Service: Orthopedics;  Laterality: Left;   TONSILLECTOMY     TOTAL HIP ARTHROPLASTY  12/10/11   [facility] left hip   TOTAL HIP ARTHROPLASTY Bilateral    TUBAL LIGATION      Prior to Admission medications   Medication Sig Start Date End Date Taking? Authorizing Provider  Adalimumab 40 MG/0.4ML PNKT Inject 40 mg into the skin every 14  (fourteen) days.     [provider]  albuterol (VENTOLIN HFA) 108 (90 Base) MCG/ACT inhaler Inhale 2 puffs into the lungs every 6 (six) hours as needed for wheezing or shortness of breath. 09/03/20   McLean-Scocuzza, Nino Glow, MD  ALPRAZolam (XANAX) 0.25 MG tablet TAKE 1 TABLET BY MOUTH AT BEDTIME AS NEEDED FOR ANXIETY. 11/04/20   Kathie Dike, MD  amLODipine (NORVASC) 2.5 MG tablet Take 1 tablet (2.5 mg total) by mouth daily. 09/03/20   McLean-Scocuzza, Nino Glow, MD  benzonatate (TESSALON) 200 MG capsule Take 1 capsule (200 mg total) by mouth 3 (three) times daily as needed for cough. Patient not taking: Reported on 10/29/2020 09/03/20   McLean-Scocuzza, Nino Glow, MD  budesonide (PULMICORT) 0.25 MG/2ML nebulizer solution USE 1 VIAL  IN  NEBULIZER TWICE  DAILY - rinse mouth after treatment 12/01/19   [name], MD  cyclobenzaprine (FLEXERIL) 10 MG tablet Take 1 tablet (10 mg total) by mouth 3 (three) times daily as needed for muscle spasms. 11/04/20   Kathie Dike, MD  dicyclomine (BENTYL) 10 MG capsule Take 1 capsule (10 mg total) by mouth 2 (two) times daily as needed for spasms. 10/01/20 09/26/21  Jonathon Bellows, MD  escitalopram (LEXAPRO) 10 MG tablet TAKE 1 TABLET BY MOUTH EVERY DAY 09/13/20   McLean-Scocuzza, Nino Glow, MD  famotidine (PEPCID) 20 MG tablet TAKE 1 TABLET (20 MG TOTAL) BY MOUTH DAILY. BEFORE BREAKFAST OR DINNER 05/23/20   McLean-Scocuzza, Nino Glow, MD  fluticasone (FLONASE) 50 MCG/ACT nasal spray Place 2 sprays into both nostrils daily. In am 12/08/19   McLean-Scocuzza, Nino Glow, MD  gabapentin (NEURONTIN) 300 MG capsule Take 2 capsules (600 mg total) by mouth in the morning and at bedtime. 09/03/20   McLean-Scocuzza, Nino Glow, MD  guaiFENesin (MUCINEX) 600 MG 12 hr tablet Take 1 tablet (600 mg total) by mouth 2 (two) times daily. 11/04/20   Kathie Dike, MD  HYDROcodone-acetaminophen (NORCO/VICODIN) 5-325 MG tablet Take 1 tablet by mouth every 6 (six) hours as needed for moderate  pain. 11/04/20   Kathie Dike, MD  ipratropium-albuterol (DUONEB) 0.5-2.5 (3) MG/3ML SOLN TAKE 3 MLS BY NEBULIZATION 3 (THREE) TIMES DAILY AS NEEDED. 07/29/20   [name], MD  lamoTRIgine (LAMICTAL) 100 MG tablet TAKE 1 TAB 2 TIMES DAILY. FURTHER REFILLS NEW PSYCH FOR ALL PSYCH MEDS ONLY TEMP SUPPLY FROM PCP 09/03/20   McLean-Scocuzza, Nino Glow, MD  leflunomide (ARAVA) 20 MG tablet Take 1 tablet (20 mg total) by mouth daily. 09/30/17   McLean-Scocuzza, Olivia Mackie  N, MD  lovastatin (MEVACOR) 20 MG tablet TAKE 1 TABLET BY MOUTH EVERYDAY AT BEDTIME *STOP TALKING 40MG * 07/12/20   McLean-Scocuzza, Nino Glow, MD  montelukast (SINGULAIR) 10 MG tablet TAKE 1 TABLET BY MOUTH EVERY DAY 08/09/20   McLean-Scocuzza, Nino Glow, MD  MYRBETRIQ 50 MG TB24 tablet TAKE 1 TABLET BY MOUTH EVERYDAY AT BEDTIME 04/29/20   Vaillancourt, Samantha, PA-C  nebivolol (BYSTOLIC) 10 MG tablet Take 0.5 tablets (5 mg total) by mouth in the morning and at bedtime. 12/08/19   McLean-Scocuzza, Nino Glow, MD  pantoprazole (PROTONIX) 40 MG tablet TAKE 1 TABLET BY MOUTH 2 TIMES DAILY 30 MIN BEFORE FOOD (NOTE REDUCTION IN FREQUENCY) 07/02/20   McLean-Scocuzza, Nino Glow, MD  predniSONE (DELTASONE) 5 MG tablet Take 40mg  po daily for 2 days then 30mg  daily for 2 days then 20mg  daily for 2 days then 10mg  daily for 2 days then 7.5mg  daily 11/04/20   Kathie Dike, MD  QUEtiapine (SEROQUEL) 25 MG tablet TAKE 1 TABLET (25 MG TOTAL) BY MOUTH AT BEDTIME. AGAIN LAST FILL FURTHER REFILLS FROM PSYCHIATRY NO EXCEPTIONS 09/13/20   McLean-Scocuzza, Nino Glow, MD  sucralfate (CARAFATE) 1 g tablet Take 1 tablet (1 g total) by mouth 2 (two) times daily. 09/17/20 09/12/21  Jonathon Bellows, MD  zoledronic acid (RECLAST) 5 MG/100ML SOLN injection Inject 5 mg into the vein once. Patient not taking: Reported on 10/29/2020    [provider]    Allergies Ceftin [cefuroxime axetil]; Lisinopril; Sulfa antibiotics; Sulfasalazine; Morphine; Xarelto [rivaroxaban]; Adhesive  [tape]; Antihistamines, chlorpheniramine-type; Antivert [meclizine hcl]; Aspirin; Contrast media [iodinated diagnostic agents]; Decongestant [pseudoephedrine hcl]; Doxycycline; Levaquin [levofloxacin in d5w]; Polymyxin b; and Tetanus toxoids  Family History  Problem Relation Age of Onset   Rheum arthritis Mother    Asthma Mother    Parkinson's disease Mother    Heart disease Mother    Stroke Mother    Hypertension Mother    Heart attack Father    Heart disease Father    Hypertension Father    Peripheral Artery Disease Father    Diabetes Son    Gout Son    Asthma Sister    Heart disease Sister    Lung cancer Sister    Heart disease Sister    Heart disease Sister    Breast cancer Sister    Heart attack Sister    Heart disease Brother    Heart disease Maternal Grandmother    Diabetes Maternal Grandmother    Colon cancer Maternal Grandmother    Cancer Maternal Grandmother        Hodgkins lymphoma   Heart disease Brother    Alcohol abuse Brother    Depression Brother    Dementia Son     Social History Social History   Tobacco Use   Smoking status: Former    Packs/day: 0.50    Years: 20.00    Pack years: 10.00    Types: Cigarettes    Quit date: 02/02/1974    Years since quitting: 46.8   Smokeless tobacco: Never  Vaping Use   Vaping Use: Never used  Substance Use Topics   Alcohol use: No   Drug use: No    Review of Systems  Constitutional: No fever/chills Eyes: No visual changes. ENT: No sore throat. Cardiovascular: Denies chest pain. Respiratory: Denies shortness of breath. Gastrointestinal: No abdominal pain.  No nausea, no vomiting.  No diarrhea.  No constipation. Genitourinary: Negative for dysuria. Musculoskeletal: back pain. Skin: Negative for rash. Neurological: Negative for headaches,  focal weakness  ____________________________________________   PHYSICAL EXAM:  VITAL SIGNS: ED Triage Vitals  Enc Vitals Group     BP 11/20/20 1339 (!) 119/53      Pulse Rate 11/20/20 1339 67     Resp 11/20/20 1339 18     Temp 11/20/20 1339 97.9 F (36.6 C)     Temp Source 11/20/20 1339 Oral     SpO2 11/20/20 1339 92 %     Weight 11/20/20 1340 182 lb 15.7 oz (83 kg)     Height 11/20/20 1340 5\' 1"  (1.549 m)     Head Circumference --      Peak Flow --      Pain Score 11/20/20 1339 8     Pain Loc --      Pain Edu? --      Excl. in [place]? --     Constitutional: Alert and oriented. Well appearing and in no acute distress. Eyes: Conjunctivae are normal. PER Head: Atraumatic except for a large lump on the occiput. Nose: No congestion/rhinnorhea. Mouth/Throat: Mucous membranes are moist.  Oropharynx non-erythematous. Neck: No stridor. No cervical spine tenderness to palpation. Cardiovascular: Normal rate, regular rhythm. Grossly normal heart sounds.  Good peripheral circulation. Respiratory: Normal respiratory effort.  No retractions. Lungs CTAB. Gastrointestinal: Soft and nontender. No distention. No abdominal bruits.  Musculoskeletal: No lower extremity tenderness except in the left knee where most of the pain is behind the knee.  The knee is a little bit swollen compared to the right 1.  Slight trace edema.   Neurologic:  Normal speech and language. No gross focal neurologic deficits are appreciated.  Skin:  Skin is warm, dry and intact. No rash noted.   ____________________________________________   LABS (all labs ordered are listed, but only abnormal results are displayed)  Labs Reviewed - No data to display ____________________________________________  EKG   ____________________________________________  RADIOLOGY Gertha Calkin, personally viewed and evaluated these images (plain radiographs) as part of my medical decision making, as well as reviewing the written report by the radiologist.  ED MD interpretation: CTs of the head C-spine and L-spine were read by radiology reviewed by me showed only DJD and some hydrocephalus.   Changes are all stable.  X-ray of the knee reviewed by me does not show any acute fractures.  Official radiology report(s): CT HEAD WO CONTRAST (5MM)  Result Date: 11/20/2020 CLINICAL DATA:  Fall, head injury, anticoagulation EXAM: CT HEAD WITHOUT CONTRAST TECHNIQUE: Contiguous axial images were obtained from the base of the skull through the vertex without intravenous contrast. COMPARISON:  09/19/2020 MRI FINDINGS: Brain: Stable mild prominence of the lateral ventricles which may be ex vacuo, as noted on prior exams, normal pressure hydrocephalus can sometimes cause a similar appearance. Periventricular white matter and corona radiata hypodensities favor chronic ischemic microvascular white matter disease. Otherwise, the brainstem, cerebellum, cerebral peduncles, thalamus, basal ganglia, basilar cisterns, and ventricular system appear within normal limits. No intracranial hemorrhage, mass lesion, or acute CVA. Vascular: There is atherosclerotic calcification of the cavernous carotid arteries bilaterally. Skull: Unremarkable Sinuses/Orbits: Chronic ethmoid and right maxillary sinusitis. Other: Minimal scalp edema along the right occipital scalp. IMPRESSION: 1. No acute intracranial findings. 2. Mild scalp edema along the right occipital scalp. 3. Stable prominence the lateral ventricles which may well be left ex vacuo. As noted on prior exams, normal pressure hydrocephalus can sometimes cause a similar appearance. 4. Periventricular white matter and corona radiata hypodensities favor chronic ischemic microvascular white matter disease.  5. Atherosclerosis. 6. Chronic ethmoid and right maxillary sinusitis. Electronically Signed   By: Van Clines M.D.   On: 11/20/2020 14:16   CT Cervical Spine Wo Contrast  Result Date: 11/20/2020 CLINICAL DATA:  Mechanical fall striking head EXAM: CT CERVICAL SPINE WITHOUT CONTRAST TECHNIQUE: Multidetector CT imaging of the cervical spine was performed without  intravenous contrast. Multiplanar CT image reconstructions were also generated. COMPARISON:  01/10/2019 FINDINGS: Despite efforts by the technologist and patient, motion artifact is present on today's exam and could not be eliminated. This reduces exam sensitivity and specificity. Alignment: No vertebral subluxation is observed. Mild levoconvex cervical scoliosis. Skull base and vertebrae: Substantial spurring and loss of articular space at the anterior C1-2 articulation. No fracture or acute bony findings identified. Multilevel degenerative facet arthropathy is specially at C2-3, C3-4, and C4-5 bilaterally. Soft tissues and spinal canal: Bilateral common carotid atherosclerotic calcification. Disc levels: No substantial degree osseous foraminal narrowing is identified. Upper chest: Mild biapical pleuroparenchymal scarring. Other: No supplemental non-categorized findings. IMPRESSION: 1. No acute cervical spine findings. 2. Cervical spondylosis. 3. Mild bilateral common carotid atherosclerotic calcification. Electronically Signed   By: Van Clines M.D.   On: 11/20/2020 14:21   CT Lumbar Spine Wo Contrast  Result Date: 11/20/2020 CLINICAL DATA:  Low back pain,  mechanical fall EXAM: CT LUMBAR SPINE WITHOUT CONTRAST TECHNIQUE: Multidetector CT imaging of the lumbar spine was performed without intravenous contrast administration. Multiplanar CT image reconstructions were also generated. COMPARISON:  07/26/2020 FINDINGS: Segmentation: The lowest lumbar type non-rib-bearing vertebra is labeled as L5. Alignment: 3 mm fused anterolisthesis at L4-5, unchanged. There is 2 mm of degenerative retrolisthesis at L2-3 and L3-4, unchanged. Vertebrae: Posterolateral rod and pedicle screw fixation at L4-5 bilaterally with interbody spacer and interbody graft material. No abnormal lucency along the pedicle screws. Stable appearance of mild scalloping of the superior endplate of L4. Substantial facet arthropathy at L4-5 and  L5-S1 bilaterally. Unfused ossicle versus old nonunited fracture the tip of the spinous process of L4, unchanged from prior. No acute lumbar spine fracture is observed. Paraspinal and other soft tissues: Atherosclerosis is present, including aortoiliac atherosclerotic disease. Absent right kidney. Disc levels: The findings at individual intervertebral levels are similar to 6/20 4/22. L1-2: No impingement.  Mild disc bulge. L2-3: Possible bilateral subarticular lateral recess stenosis and likely moderate central narrowing of the thecal sac due to diffuse disc bulge and facet arthropathy. L3-4: Mild to moderate central narrowing of the thecal sac and mild left foraminal stenosis due to disc bulge, facet arthropathy, and short pedicles. L4-5: Mild bilateral foraminal stenosis due to disc uncovering and facet arthropathy. Prior left laminectomy. L5-S1: Mild bilateral foraminal stenosis due to intervertebral and facet spurring. IMPRESSION: 1. No acute fracture or acute abnormality observed. 2. Lumbar spondylosis and degenerative disc disease causing moderate impingement at L2-3, mild to moderate impingement at L3-4, and mild impingement at L4-5 and L5-S1, as noted above. 3. Prior L4-5 fusion. 4.  Aortic Atherosclerosis (ICD10-I70.0). Electronically Signed   By: Van Clines M.D.   On: 11/20/2020 14:11    ____________________________________________   PROCEDURES  Procedure(s) performed (including Critical Care):  Procedures   ____________________________________________   INITIAL IMPRESSION / ASSESSMENT AND PLAN / ED COURSE Patient with mechanical fall.  No loss of consciousness.  She is awake alert and doing well now.  Her pain is improved somewhat since arrival.  CTs of the C-spine L-spine and head do not show any new changes.  No fracture seen by me on that  knee x-ray.  I will let her go.  She can use Tylenol if need be for pain and she will return if she is worse.  She has an appointment with  neurosurgery for back surgery soon.              ____________________________________________   FINAL CLINICAL IMPRESSION(S) / ED DIAGNOSES  Final diagnoses:  Fall, initial encounter     ED Discharge Orders     None        Note:  This document was prepared using Dragon voice recognition software and may include unintentional dictation errors.    Nena Polio, MD 11/20/20 [phone]

## 2020-11-20 NOTE — ED Triage Notes (Addendum)
Pt comes into the ED via [facility] from home c/o fall.  Pt does present with hematoma to the posterior side of her head.  Pt does take Plavix but denies any LOC.  Pt states it was a mechanical fall.  Pt also c/o left knee pain with known arthritis but states its worse.  138/78 72 HR 94% RA

## 2020-11-20 NOTE — Discharge Instructions (Addendum)
The CAT scans and x-ray of the knee are all okay.  There are no new changes there.  Please use Tylenol as needed for pain.  Please return for any increasing swelling on the back of your head or worsening pain or any new symptoms.  Keep your appointments as scheduled.  Continue your medications as instructed.

## 2020-11-20 NOTE — ED Notes (Signed)
ED Provider at bedside. 

## 2020-11-20 NOTE — Telephone Encounter (Signed)
Transition Care Management Unsuccessful Follow-up Telephone Call  Date of discharge and from where:  11/16/20[facility] [place]  Attempts:  2nd Attempt  Reason for unsuccessful TCM follow-up call:  Unable to reach patient.

## 2020-11-20 NOTE — ED Notes (Addendum)
This RN to bedside for assessment. Pt. Denies any needs at this moment, is resting in stretcher, NAD. Call light in reach.

## 2020-11-21 NOTE — Telephone Encounter (Signed)
Transition Care Management Follow-up Telephone Call Date of discharge and from where:      11/16/20 - [place] 11/04/20- [place] How have you been since you were released from the hospital? Patient states," I was in the ED for a fall yesterday and my L knee/leg and back hurts. As directed, taking oxycodone at bedtime. Muscle relaxer during the day. Pain improved with medication. Denies shortness of breath, dizziness. Oxygen in use at bedtime only.   Any questions or concerns? No  Items Reviewed: Did the pt receive and understand the discharge instructions provided? Yes  Medications obtained and verified? Yes  Other? No  Any new allergies since your discharge? No  Dietary orders reviewed? Yes Do you have support at home? Yes   Home Care and Equipment/Supplies: Were home health services ordered? Yes,  HH in progress.   Functional Questionnaire: (I = Independent and D = Dependent) ADLs: Family assist as needed  Bathing/Dressing- I  Meal Prep- Family assist   Eating- I  Maintaining continence- Bladder. Depends in use.   Transferring/Ambulation- Walker  Managing Meds- I  Follow up appointments reviewed:  PCP Hospital f/u appt confirmed? Yes  Scheduled to see PCP on 11/22/20 @ 2:30. Are transportation arrangements needed? No  If their condition worsens, is the pt aware to call PCP or go to the Emergency Dept.? Yes Was the patient provided with contact information for the PCP's office or ED? Yes Was to pt encouraged to call back with questions or concerns? Yes

## 2020-11-22 ENCOUNTER — Emergency Department: Payer: Medicare PPO

## 2020-11-22 ENCOUNTER — Emergency Department
Admission: EM | Admit: 2020-11-22 | Discharge: 2020-11-22 | Disposition: A | Discharge status: Home / Self Care | Payer: Medicare PPO | Attending: Emergency Medicine | Admitting: Emergency Medicine

## 2020-11-22 ENCOUNTER — Encounter: Payer: Self-pay | Admitting: Internal Medicine

## 2020-11-22 ENCOUNTER — Ambulatory Visit: Payer: Medicare PPO | Admitting: Internal Medicine

## 2020-11-22 ENCOUNTER — Other Ambulatory Visit: Payer: Self-pay

## 2020-11-22 VITALS — BP 128/80 | HR 83 | Temp 97.7°F | Ht 61.0 in | Wt 183.0 lb

## 2020-11-22 DIAGNOSIS — Z7952 Long term (current) use of systemic steroids: Secondary | ICD-10-CM | POA: Insufficient documentation

## 2020-11-22 DIAGNOSIS — R0602 Shortness of breath: Secondary | ICD-10-CM | POA: Diagnosis not present

## 2020-11-22 DIAGNOSIS — R5381 Other malaise: Secondary | ICD-10-CM | POA: Diagnosis not present

## 2020-11-22 DIAGNOSIS — J45909 Unspecified asthma, uncomplicated: Secondary | ICD-10-CM | POA: Insufficient documentation

## 2020-11-22 DIAGNOSIS — Z741 Need for assistance with personal care: Secondary | ICD-10-CM

## 2020-11-22 DIAGNOSIS — I129 Hypertensive chronic kidney disease with stage 1 through stage 4 chronic kidney disease, or unspecified chronic kidney disease: Secondary | ICD-10-CM | POA: Insufficient documentation

## 2020-11-22 DIAGNOSIS — M25562 Pain in left knee: Secondary | ICD-10-CM

## 2020-11-22 DIAGNOSIS — G8929 Other chronic pain: Secondary | ICD-10-CM | POA: Diagnosis not present

## 2020-11-22 DIAGNOSIS — I38 Endocarditis, valve unspecified: Secondary | ICD-10-CM

## 2020-11-22 DIAGNOSIS — Z96651 Presence of right artificial knee joint: Secondary | ICD-10-CM | POA: Insufficient documentation

## 2020-11-22 DIAGNOSIS — Z79899 Other long term (current) drug therapy: Secondary | ICD-10-CM | POA: Diagnosis not present

## 2020-11-22 DIAGNOSIS — M25552 Pain in left hip: Secondary | ICD-10-CM | POA: Insufficient documentation

## 2020-11-22 DIAGNOSIS — J449 Chronic obstructive pulmonary disease, unspecified: Secondary | ICD-10-CM | POA: Diagnosis not present

## 2020-11-22 DIAGNOSIS — R7989 Other specified abnormal findings of blood chemistry: Secondary | ICD-10-CM

## 2020-11-22 DIAGNOSIS — Z96643 Presence of artificial hip joint, bilateral: Secondary | ICD-10-CM | POA: Insufficient documentation

## 2020-11-22 DIAGNOSIS — L989 Disorder of the skin and subcutaneous tissue, unspecified: Secondary | ICD-10-CM

## 2020-11-22 DIAGNOSIS — Z96611 Presence of right artificial shoulder joint: Secondary | ICD-10-CM | POA: Insufficient documentation

## 2020-11-22 DIAGNOSIS — N1832 Chronic kidney disease, stage 3b: Secondary | ICD-10-CM | POA: Insufficient documentation

## 2020-11-22 DIAGNOSIS — R296 Repeated falls: Secondary | ICD-10-CM

## 2020-11-22 DIAGNOSIS — D72829 Elevated white blood cell count, unspecified: Secondary | ICD-10-CM

## 2020-11-22 DIAGNOSIS — W1839XA Other fall on same level, initial encounter: Secondary | ICD-10-CM | POA: Diagnosis not present

## 2020-11-22 DIAGNOSIS — J90 Pleural effusion, not elsewhere classified: Secondary | ICD-10-CM | POA: Diagnosis not present

## 2020-11-22 DIAGNOSIS — I2510 Atherosclerotic heart disease of native coronary artery without angina pectoris: Secondary | ICD-10-CM | POA: Insufficient documentation

## 2020-11-22 DIAGNOSIS — Z1283 Encounter for screening for malignant neoplasm of skin: Secondary | ICD-10-CM

## 2020-11-22 DIAGNOSIS — R32 Unspecified urinary incontinence: Secondary | ICD-10-CM | POA: Diagnosis not present

## 2020-11-22 DIAGNOSIS — E876 Hypokalemia: Secondary | ICD-10-CM

## 2020-11-22 DIAGNOSIS — R29898 Other symptoms and signs involving the musculoskeletal system: Secondary | ICD-10-CM

## 2020-11-22 DIAGNOSIS — Z87891 Personal history of nicotine dependence: Secondary | ICD-10-CM | POA: Insufficient documentation

## 2020-11-22 DIAGNOSIS — Z23 Encounter for immunization: Secondary | ICD-10-CM | POA: Diagnosis not present

## 2020-11-22 DIAGNOSIS — M25559 Pain in unspecified hip: Secondary | ICD-10-CM

## 2020-11-22 DIAGNOSIS — Z7409 Other reduced mobility: Secondary | ICD-10-CM

## 2020-11-22 MED ORDER — OXYCODONE-ACETAMINOPHEN 5-325 MG PO TABS: 1.0000 | ORAL_TABLET | ORAL | 0 refills | Status: DC | PRN

## 2020-11-22 MED ORDER — ONDANSETRON 8 MG PO TBDP: 8.0000 mg | ORAL_TABLET | Freq: Once | ORAL | Status: DC

## 2020-11-22 MED ORDER — ALUM & MAG HYDROXIDE-SIMETH 200-200-20 MG/5ML PO SUSP
15.0000 mL | Freq: Once | ORAL | Status: AC
  Administered 2020-11-22: 15 mL via ORAL
  Filled 2020-11-22: qty 30

## 2020-11-22 MED ORDER — OXYCODONE-ACETAMINOPHEN 5-325 MG PO TABS
1.0000 | ORAL_TABLET | Freq: Once | ORAL | Status: AC
  Administered 2020-11-22: 1 via ORAL
  Filled 2020-11-22: qty 1

## 2020-11-22 MED ORDER — LIDOCAINE 5 % EX PTCH
1.0000 | MEDICATED_PATCH | CUTANEOUS | Status: DC
  Administered 2020-11-22: 1 via TRANSDERMAL
  Filled 2020-11-22: qty 1

## 2020-11-22 MED ORDER — LIDOCAINE 5 % EX PTCH: 1.0000 | MEDICATED_PATCH | Freq: Two times a day (BID) | CUTANEOUS | 2 refills | Status: DC | PRN

## 2020-11-22 NOTE — Discharge Instructions (Signed)
No acute findings on x-ray of the left hip.  Read and follow discharge care instruction.  Use Lidoderm patches as needed.

## 2020-11-22 NOTE — ED Triage Notes (Signed)
Pt comes into the ED via EMS from home, pt c/o left hip pain, had recent fall and was seen here 10/19 Reported verbally abusive to EMS  150/79 93%RA 94HR

## 2020-11-22 NOTE — ED Provider Notes (Signed)
[facility] Emergency Department Provider Note   ____________________________________________   Event Date/Time   First MD Initiated Contact with Patient 11/22/20 [phone]     (approximate)  I have reviewed the triage vital signs and the nursing notes.   HISTORY  Chief Complaint Hip Pain    HPI Kerri Carter is a 80 y.o. female patient complain of left hip pain secondary to fall 2 days ago.  Patient was seen at this facility and had images consistent CAT scan of the head in, cervical spine, and lumbar spine.  Patient also had an x-ray of the left knee.  No acute findings on imaging.  Patient states bilateral  hip replacement.  Patient is verbally abusive and impatient.  Patient is asking for medication for gastric reflux and hip pain.         Past Medical History:  Diagnosis Date   Anxiety    Asthma    Bipolar disorder ([place])    C. difficile diarrhea    from Abx 2010 hospitalized    Cataract    s/p b/l repair    Chicken pox    CKD (chronic kidney disease)    CKD (chronic kidney disease), stage III (HCC)    a. s/p R nephrectomy./ aneurysm   Conversion disorder    COPD (chronic obstructive pulmonary disease) (HCC)    Depression    Essential hypertension    GERD (gastroesophageal reflux disease)    Hyperlipidemia    ILD (interstitial lung disease) (HCC)    mild; 2/2 RA diagnosis   Inflammatory arthritis    a. hands/carpal tunnel.  b. Low titer rheumatoid factor. c. Negative anti-CCP antibodies. d. Plaquenil.   Macular degeneration    Nocturnal hypoxemia    Non-Obstructive CAD    a. 07/2009 Cath ([facility]): nonobs dzs;  b. 03/2011 Cath [facility]): nonobs dzs.   Osteoarthritis    a. Knees.   PAD (peripheral artery disease) (HCC)    PUD (peptic ulcer disease)    S/P right hip fracture    11/01/16 s/p repair   Shoulder pain    Sleep apnea    no cpap / minimal   Spinal stenosis at L4-L5 level    severe with L4/L5 anterolisthesis grade 1 anterolisthesis     Toxic maculopathy    Valvular heart disease    a. 07/2015 Echo: EF 55-60%, Mild AI, AS, MR, and TR.    Patient Active Problem List   Diagnosis Date Noted   COPD exacerbation ([place]) 10/29/2020   Acromial process of scapula fracture 12/21/2019   NSIP (nonspecific interstitial pneumonitis) ([place]) 12/08/2019   Iliopsoas bursitis of right hip 08/29/2019   Chronic left shoulder pain 08/29/2019   Overweight (BMI 25.0-29.9) 08/29/2019   Hypertriglyceridemia 07/24/2019   Bipolar disorder, in full remission, most recent episode depressed ([place]) 07/13/2019   Essential hypertension 06/16/2019   Chronic pain 06/16/2019   Lymphedema 06/05/2019   Chronic venous insufficiency 05/25/2019   PAD (peripheral artery disease) ([place]) 05/25/2019   Leg edema 05/02/2019   Episodic mood disorder ([place]) 69/67/8938   Complicated grief 11/18/5100   At high risk for falls 03/16/2019   Pelvic mass in female 03/16/2019   Compression fracture of L4 vertebra ([place]) 02/21/2019   Collapsed vertebra, not elsewhere classified, lumbar region, initial encounter for fracture ([place]) 02/21/2019   Anxiety 01/20/2019   Pain due to onychomycosis of toenails of both feet 12/05/2018   Hav (hallux abducto valgus), unspecified laterality 12/05/2018   Closed fracture of acromial  process of scapula 10/28/2018   Status post reverse arthroplasty of shoulder, left 07/26/2018   Interstitial pulmonary disease ([place]) 07/07/2018   Fatigue 07/07/2018   Avascular necrosis of left shoulder due to adverse effect of steroid therapy ([place]) 01/20/2018   Other shoulder lesions, left shoulder 12/20/2017   Status post reverse total shoulder replacement, right 11/04/2017   Leukocytosis 10/19/2017   Allergic rhinitis 09/28/2017   Rotator cuff tendinitis, right 07/26/2017   Strain of right hip 02/26/2017   History of fracture of left hip 01/15/2017   Status post lumbar spinal fusion 01/15/2017   Dislocation of hip joint prosthesis ([place]) 01/08/2017    Status post hip hemiarthroplasty 01/08/2017   Leg swelling 12/15/2016   Age-related osteoporosis without current pathological fracture 11/05/2016   Hip fracture ([place]) 10/31/2016   Polyneuropathy 10/28/2016   Left foot pain 10/19/2016   Spinal stenosis of lumbar region 09/15/2016   Osteoporosis 08/27/2016   Drug-induced osteoporosis 08/27/2016   Adnexal mass 08/25/2016   Prediabetes 08/25/2016   Coronary atherosclerosis 08/25/2016   History of syncope 08/25/2016   Hypokalemia 08/25/2016   Mixed bipolar I disorder ([place]) 10/09/2015   Stage 3b chronic kidney disease ([place])    Conversion disorder 08/04/2015   Hyperlipidemia 07/17/2015   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   Macular degeneration 07/17/2015   Insomnia 07/17/2015   Rheumatoid arthritis ([place]) 12/05/2014   Other specified postprocedural states 07/20/2013   Anxiety and depression 06/13/2013   Osteoarthritis of knee 06/07/2013   Cough 05/02/2012   Valvular heart disease 10/13/2011   Asthma-COPD overlap syndrome ([place]) 09/09/2011   OSA (obstructive sleep apnea) 09/09/2011   Nocturnal hypoxemia 09/09/2011   Gastroesophageal reflux disease 02/24/2011   Single kidney 02/24/2011   H/O cardiac catheterization 02/24/2011   Renal artery stenosis ([place]) 02/24/2011    Past Surgical History:  Procedure Laterality Date   APPENDECTOMY     BACK SURGERY     lumbar fusion   BUNIONECTOMY Right    CATARACT EXTRACTION, BILATERAL     CESAREAN SECTION     x1   CHOLECYSTECTOMY N/A 05/11/2016   Procedure: LAPAROSCOPIC CHOLECYSTECTOMY;  Surgeon: Florene Glen, MD;  Location: [facility] ORS;  Service: General;  Laterality: N/A;   COLONOSCOPY WITH PROPOFOL N/A 04/02/2016   Procedure: COLONOSCOPY WITH PROPOFOL;  Surgeon: Jonathon Bellows, MD;  Location: [facility] ENDOSCOPY;  Service: Endoscopy;  Laterality: N/A;   ENDOSCOPIC RETROGRADE CHOLANGIOPANCREATOGRAPHY (ERCP) WITH PROPOFOL N/A 05/08/2016   Procedure: ENDOSCOPIC RETROGRADE  CHOLANGIOPANCREATOGRAPHY (ERCP) WITH PROPOFOL;  Surgeon: Lucilla Lame, MD;  Location: [facility] ENDOSCOPY;  Service: Endoscopy;  Laterality: N/A;   ERCP     with biliary spincterotomy 05/08/16 Dr. Allen Norris for choledocholithiasis    ESOPHAGEAL DILATION  04/02/2016   Procedure: ESOPHAGEAL DILATION;  Surgeon: Jonathon Bellows, MD;  Location: [facility] ENDOSCOPY;  Service: Endoscopy;;   ESOPHAGOGASTRODUODENOSCOPY (EGD) WITH PROPOFOL N/A 04/02/2016   Procedure: ESOPHAGOGASTRODUODENOSCOPY (EGD) WITH PROPOFOL;  Surgeon: Jonathon Bellows, MD;  Location: [facility] ENDOSCOPY;  Service: Endoscopy;  Laterality: N/A;   HIP ARTHROPLASTY Right 11/01/2016   Procedure: ARTHROPLASTY BIPOLAR HIP (HEMIARTHROPLASTY);  Surgeon: Corky Mull, MD;  Location: [facility] ORS;  Service: Orthopedics;  Laterality: Right;   NEPHRECTOMY  1988   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   ORIF SCAPHOID FRACTURE Left 12/21/2019   Procedure: OPEN REDUCTION INTERNAL FIXATION (ORIF) OF LEFT SCAPULAR NONUNION WITH BONE GRAFT;  Surgeon: Corky Mull, MD;  Location: [facility] ORS;  Service: Orthopedics;  Laterality: Left;  osteoporosis     noted DEXA 08/19/16    REPLACEMENT TOTAL KNEE Right    REVERSE SHOULDER ARTHROPLASTY Right 11/04/2017   Procedure: REVERSE SHOULDER ARTHROPLASTY;  Surgeon: Corky Mull, MD;  Location: [facility] ORS;  Service: Orthopedics;  Laterality: Right;   REVERSE SHOULDER ARTHROPLASTY Left 07/26/2018   Procedure: REVERSE SHOULDER ARTHROPLASTY;  Surgeon: Corky Mull, MD;  Location: [facility] ORS;  Service: Orthopedics;  Laterality: Left;   TONSILLECTOMY     TOTAL HIP ARTHROPLASTY  12/10/11   [facility] left hip   TOTAL HIP ARTHROPLASTY Bilateral    TUBAL LIGATION      Prior to Admission medications   Medication Sig Start Date End Date Taking? Authorizing Provider  oxyCODONE-acetaminophen (PERCOCET) 5-325 MG tablet Take 1 tablet by mouth every 4 (four) hours as needed for severe pain. 11/22/20 11/22/21 Yes Sable Feil, PA-C  Adalimumab  40 MG/0.4ML PNKT Inject 40 mg into the skin every 14 (fourteen) days.     [provider]  albuterol (VENTOLIN HFA) 108 (90 Base) MCG/ACT inhaler Inhale 2 puffs into the lungs every 6 (six) hours as needed for wheezing or shortness of breath. 09/03/20   McLean-Scocuzza, Nino Glow, MD  ALPRAZolam (XANAX) 0.25 MG tablet TAKE 1 TABLET BY MOUTH AT BEDTIME AS NEEDED FOR ANXIETY. 11/04/20   Kathie Dike, MD  amLODipine (NORVASC) 2.5 MG tablet Take 1 tablet (2.5 mg total) by mouth daily. 09/03/20   McLean-Scocuzza, Nino Glow, MD  benzonatate (TESSALON) 200 MG capsule Take 1 capsule (200 mg total) by mouth 3 (three) times daily as needed for cough. Patient not taking: Reported on 10/29/2020 09/03/20   McLean-Scocuzza, Nino Glow, MD  budesonide (PULMICORT) 0.25 MG/2ML nebulizer solution USE 1 VIAL  IN  NEBULIZER TWICE  DAILY - rinse mouth after treatment 12/01/19   [name], MD  cyclobenzaprine (FLEXERIL) 10 MG tablet Take 1 tablet (10 mg total) by mouth 3 (three) times daily as needed for muscle spasms. 11/04/20   Kathie Dike, MD  dicyclomine (BENTYL) 10 MG capsule Take 1 capsule (10 mg total) by mouth 2 (two) times daily as needed for spasms. 10/01/20 09/26/21  Jonathon Bellows, MD  escitalopram (LEXAPRO) 10 MG tablet TAKE 1 TABLET BY MOUTH EVERY DAY 09/13/20   McLean-Scocuzza, Nino Glow, MD  famotidine (PEPCID) 20 MG tablet TAKE 1 TABLET (20 MG TOTAL) BY MOUTH DAILY. BEFORE BREAKFAST OR DINNER 05/23/20   McLean-Scocuzza, Nino Glow, MD  fluticasone (FLONASE) 50 MCG/ACT nasal spray Place 2 sprays into both nostrils daily. In am 12/08/19   McLean-Scocuzza, Nino Glow, MD  gabapentin (NEURONTIN) 300 MG capsule Take 2 capsules (600 mg total) by mouth in the morning and at bedtime. 09/03/20   McLean-Scocuzza, Nino Glow, MD  guaiFENesin (MUCINEX) 600 MG 12 hr tablet Take 1 tablet (600 mg total) by mouth 2 (two) times daily. 11/04/20   Kathie Dike, MD  HYDROcodone-acetaminophen (NORCO/VICODIN) 5-325 MG tablet Take 1 tablet  by mouth every 6 (six) hours as needed for moderate pain. 11/04/20   Kathie Dike, MD  ipratropium-albuterol (DUONEB) 0.5-2.5 (3) MG/3ML SOLN TAKE 3 MLS BY NEBULIZATION 3 (THREE) TIMES DAILY AS NEEDED. 07/29/20   [name], MD  lamoTRIgine (LAMICTAL) 100 MG tablet TAKE 1 TAB 2 TIMES DAILY. FURTHER REFILLS NEW PSYCH FOR ALL PSYCH MEDS ONLY TEMP SUPPLY FROM PCP 09/03/20   McLean-Scocuzza, Nino Glow, MD  leflunomide (ARAVA) 20 MG tablet Take 1 tablet (20 mg total) by mouth daily. 09/30/17   McLean-Scocuzza, Nino Glow, MD  lovastatin (MEVACOR) 20 MG tablet TAKE 1 TABLET BY MOUTH EVERYDAY AT BEDTIME *STOP TALKING 40MG * 07/12/20   McLean-Scocuzza, Nino Glow, MD  montelukast (SINGULAIR) 10 MG tablet TAKE 1 TABLET BY MOUTH EVERY DAY 08/09/20   McLean-Scocuzza, Nino Glow, MD  MYRBETRIQ 50 MG TB24 tablet TAKE 1 TABLET BY MOUTH EVERYDAY AT BEDTIME 04/29/20   Vaillancourt, Samantha, PA-C  nebivolol (BYSTOLIC) 10 MG tablet Take 0.5 tablets (5 mg total) by mouth in the morning and at bedtime. 12/08/19   McLean-Scocuzza, Nino Glow, MD  pantoprazole (PROTONIX) 40 MG tablet TAKE 1 TABLET BY MOUTH 2 TIMES DAILY 30 MIN BEFORE FOOD (NOTE REDUCTION IN FREQUENCY) 07/02/20   McLean-Scocuzza, Nino Glow, MD  predniSONE (DELTASONE) 5 MG tablet Take 40mg  po daily for 2 days then 30mg  daily for 2 days then 20mg  daily for 2 days then 10mg  daily for 2 days then 7.5mg  daily 11/04/20   Kathie Dike, MD  QUEtiapine (SEROQUEL) 25 MG tablet TAKE 1 TABLET (25 MG TOTAL) BY MOUTH AT BEDTIME. AGAIN LAST FILL FURTHER REFILLS FROM PSYCHIATRY NO EXCEPTIONS 09/13/20   McLean-Scocuzza, Nino Glow, MD  sucralfate (CARAFATE) 1 g tablet Take 1 tablet (1 g total) by mouth 2 (two) times daily. 09/17/20 09/12/21  Jonathon Bellows, MD  zoledronic acid (RECLAST) 5 MG/100ML SOLN injection Inject 5 mg into the vein once. Patient not taking: Reported on 10/29/2020    [provider]    Allergies Ceftin [cefuroxime axetil]; Lisinopril; Sulfa antibiotics;  Sulfasalazine; Morphine; Xarelto [rivaroxaban]; Adhesive [tape]; Antihistamines, chlorpheniramine-type; Antivert [meclizine hcl]; Aspirin; Contrast media [iodinated diagnostic agents]; Decongestant [pseudoephedrine hcl]; Doxycycline; Levaquin [levofloxacin in d5w]; Polymyxin b; and Tetanus toxoids  Family History  Problem Relation Age of Onset   Rheum arthritis Mother    Asthma Mother    Parkinson's disease Mother    Heart disease Mother    Stroke Mother    Hypertension Mother    Heart attack Father    Heart disease Father    Hypertension Father    Peripheral Artery Disease Father    Diabetes Son    Gout Son    Asthma Sister    Heart disease Sister    Lung cancer Sister    Heart disease Sister    Heart disease Sister    Breast cancer Sister    Heart attack Sister    Heart disease Brother    Heart disease Maternal Grandmother    Diabetes Maternal Grandmother    Colon cancer Maternal Grandmother    Cancer Maternal Grandmother        Hodgkins lymphoma   Heart disease Brother    Alcohol abuse Brother    Depression Brother    Dementia Son     Social History Social History   Tobacco Use   Smoking status: Former    Packs/day: 0.50    Years: 20.00    Pack years: 10.00    Types: Cigarettes    Quit date: 02/02/1974    Years since quitting: 46.8   Smokeless tobacco: Never  Vaping Use   Vaping Use: Never used  Substance Use Topics   Alcohol use: No   Drug use: No    Review of Systems Constitutional: No fever/chills Eyes: No visual changes. ENT: No sore throat. Cardiovascular: Denies chest pain. Respiratory: Denies shortness of breath. Gastrointestinal: No abdominal pain.  No nausea, no vomiting.  No diarrhea.  No constipation. Genitourinary: Negative for dysuria. Musculoskeletal: Left hip pain. Skin: Negative for rash. Neurological: Negative for headaches, focal weakness or  numbness. {Psychiatric: Anxiety, bipolar, and depression. Endocrine: Chronic kidney disease,  hyperlipidemia, and hypertension. Allergic/Immunilogical: See extensive allergy list. ____________________________________________   PHYSICAL EXAM:  VITAL SIGNS: ED Triage Vitals  Enc Vitals Group     BP 11/22/20 0741 (!) 153/72     Pulse Rate 11/22/20 0741 94     Resp 11/22/20 0741 16     Temp 11/22/20 0741 98.5 F (36.9 C)     Temp Source 11/22/20 0741 Oral     SpO2 11/22/20 0741 94 %     Weight 11/22/20 0730 180 lb (81.6 kg)     Height 11/22/20 0730 5\' 1"  (1.549 m)     Head Circumference --      Peak Flow --      Pain Score 11/22/20 0730 10     Pain Loc --      Pain Edu? --      Excl. in [place]? --    Constitutional: Alert and oriented. Well appearing and in no acute distress. Eyes: Conjunctivae are normal. PERRL. EOMI. Head: Atraumatic. Nose: No congestion/rhinnorhea. Mouth/Throat: Mucous membranes are moist.  Oropharynx non-erythematous. Neck: No stridor.  No cervical spine tenderness to palpation. Hematological/Lymphatic/Immunilogical: No cervical lymphadenopathy. Cardiovascular: Normal rate, regular rhythm. Grossly normal heart sounds.  Good peripheral circulation. Respiratory: Normal respiratory effort.  No retractions. Lungs CTAB. Gastrointestinal: Soft and nontender. No distention. No abdominal bruits. No CVA tenderness. Genitourinary: Deferred Musculoskeletal: No lower extremity tenderness nor edema.  No leg length discrepancy.   Neurologic:  Normal speech and language. No gross focal neurologic deficits are appreciated. No gait instability. Skin:  Skin is warm, dry and intact. No rash noted. Psychiatric: Mood and affect are normal. Speech and behavior are normal.  ____________________________________________   LABS (all labs ordered are listed, but only abnormal results are displayed)  Labs Reviewed - No data to display ____________________________________________  EKG   ____________________________________________  RADIOLOGY I, Sable Feil,  personally viewed and evaluated these images (plain radiographs) as part of my medical decision making, as well as reviewing the written report by the radiologist.  ED MD interpretation:    Official radiology report(s): DG Hip Unilat W or Wo Pelvis 2-3 Views Left  Result Date: 11/22/2020 CLINICAL DATA:  Pt comes into the ED via EMS from home, pt c/o left hip pain, had recent fall 2 days ago and was seen here 10/19 for same. Pain secondary to a fall 2 days ago. EXAM: DG HIP (WITH OR WITHOUT PELVIS) 2-3V LEFT COMPARISON:  10/29/2020 FINDINGS: Bilateral hip prosthetics. No fracture dislocation within the pelvis or femurs. Posterior lumbar fusion. Dedicated views of the LEFT hip demonstrate no fracture or dislocation. IMPRESSION: No acute osseous abnormality. Electronically Signed   By: Suzy Bouchard M.D.   On: 11/22/2020 08:58    ____________________________________________   PROCEDURES  Procedure(s) performed (including Critical Care):  Procedures   ____________________________________________   INITIAL IMPRESSION / ASSESSMENT AND PLAN / ED COURSE  As part of my medical decision making, I reviewed the following data within the [place]         Patient presents with left hip pain secondary to a fall 2 days ago.  Discussed no acute findings on x-rays with patient.  Patient complaining physical exam consistent muscular pain secondary to fall.  Patient given discharge care instruction vies take medication as directed.  Patient vies follow home station PCP.   ____________________________________________   FINAL CLINICAL IMPRESSION(S) / ED DIAGNOSES  Final diagnoses:  Left hip  pain     ED Discharge Orders          Ordered    oxyCODONE-acetaminophen (PERCOCET) 5-325 MG tablet  Every 4 hours PRN        11/22/20 0913             Note:  This document was prepared using Dragon voice recognition software and may include unintentional dictation  errors.    Sable Feil, PA-C 11/22/20 6606    Harvest Dark, MD 11/22/20 [phone]

## 2020-11-22 NOTE — Patient Instructions (Addendum)
Frequent falls follow up with neurology call to follow up [facility] clinic   Dermatology referred   [facility] for psychiatric follow up [address] [phone]   11/25/2020 Office Visit Rheumatology Meda Klinefelter., MD   [place], [place] [address]   [phone] (Work)   [phone] (Fax)      12/03/2020 Ancillary Orders Lab Germain Osgood, MD   [place]   [facility] [address] [place], [place] [address]   [phone] (Work)   [phone] (Fax)      12/10/2020 Office Visit Endocrinology [place], Felipa Evener, MD   [place]   [facility] [place], [place] [address]   [phone] (Work)   [phone] (Fax)      12/24/2020 [facility] Ricard Dillon, [address] [place], [place] [address]   [phone] (Work)   [phone] (Fax)     Loleta Dicker, [place], [place] [address]   [phone] (Work)   [phone] (Fax)      01/23/2021 [place] [facility] Ricard Dillon, [address] [place], [place] [address]   [phone] (Work)   [phone] (Fax)      02/24/2021 Office Visit Ophthalmology [place], Caesar Bookman, [place]   [place], [place] [address]   [phone] (Work)   [phone] (Fax)      06/05/2021 Ancillary Procedure Obstetrics and Gynecology Sherrie George, [place] [address]   [place], [place] [address]   [phone] (Work)   [phone] (Fax)      06/05/2021 Office Visit Obstetrics and Gynecology Sherrie George, [place]   [place], [place] [address]   [phone] (Work)   [phone] (Fax)     Details Date Type Department Care Team Description  10/30/2020 Telephone [facility]   [place], [place] [address]   [phone]   Kerry Dory, [place]   [place], [place] [address]   [phone] (Work)   [phone] (Fax)   MRI

## 2020-11-23 LAB — CBC WITH DIFFERENTIAL/PLATELET
Absolute Monocytes: 745 cells/uL (ref 200–950)
Basophils Absolute: 43 cells/uL (ref 0–200)
Basophils Relative: 0.4 %
Eosinophils Absolute: 205 cells/uL (ref 15–500)
Eosinophils Relative: 1.9 %
HCT: 39.3 % (ref 35.0–45.0)
Hemoglobin: 12.7 g/dL (ref 11.7–15.5)
Lymphs Abs: 1652 cells/uL (ref 850–3900)
MCH: 28.8 pg (ref 27.0–33.0)
MCHC: 32.3 g/dL (ref 32.0–36.0)
MCV: 89.1 fL (ref 80.0–100.0)
MPV: 11.6 fL (ref 7.5–12.5)
Monocytes Relative: 6.9 %
Neutro Abs: 8154 cells/uL — ABNORMAL HIGH (ref 1500–7800)
Neutrophils Relative %: 75.5 %
Platelets: 185 10*3/uL (ref 140–400)
RBC: 4.41 10*6/uL (ref 3.80–5.10)
RDW: 14.1 % (ref 11.0–15.0)
Total Lymphocyte: 15.3 %
WBC: 10.8 10*3/uL (ref 3.8–10.8)

## 2020-11-23 LAB — BASIC METABOLIC PANEL
BUN/Creatinine Ratio: 11 (calc) (ref 6–22)
BUN: 17 mg/dL (ref 7–25)
CO2: 26 mmol/L (ref 20–32)
Calcium: 9.4 mg/dL (ref 8.6–10.4)
Chloride: 99 mmol/L (ref 98–110)
Creat: 1.48 mg/dL — ABNORMAL HIGH (ref 0.60–1.00)
Glucose, Bld: 157 mg/dL — ABNORMAL HIGH (ref 65–99)
Potassium: 4.5 mmol/L (ref 3.5–5.3)
Sodium: 139 mmol/L (ref 135–146)

## 2020-11-23 LAB — MAGNESIUM: Magnesium: 2.1 mg/dL (ref 1.5–2.5)

## 2020-11-26 ENCOUNTER — Ambulatory Visit: Payer: Medicare PPO

## 2020-11-28 DIAGNOSIS — F32A Depression, unspecified: Secondary | ICD-10-CM | POA: Diagnosis not present

## 2020-11-28 DIAGNOSIS — I2510 Atherosclerotic heart disease of native coronary artery without angina pectoris: Secondary | ICD-10-CM | POA: Diagnosis not present

## 2020-11-28 DIAGNOSIS — M069 Rheumatoid arthritis, unspecified: Secondary | ICD-10-CM | POA: Diagnosis not present

## 2020-11-28 DIAGNOSIS — J449 Chronic obstructive pulmonary disease, unspecified: Secondary | ICD-10-CM | POA: Diagnosis not present

## 2020-11-28 DIAGNOSIS — I130 Hypertensive heart and chronic kidney disease with heart failure and stage 1 through stage 4 chronic kidney disease, or unspecified chronic kidney disease: Secondary | ICD-10-CM | POA: Diagnosis not present

## 2020-11-28 DIAGNOSIS — D631 Anemia in chronic kidney disease: Secondary | ICD-10-CM | POA: Diagnosis not present

## 2020-11-28 DIAGNOSIS — I509 Heart failure, unspecified: Secondary | ICD-10-CM | POA: Diagnosis not present

## 2020-11-28 DIAGNOSIS — E785 Hyperlipidemia, unspecified: Secondary | ICD-10-CM | POA: Diagnosis not present

## 2020-11-28 DIAGNOSIS — N189 Chronic kidney disease, unspecified: Secondary | ICD-10-CM | POA: Diagnosis not present

## 2020-11-29 ENCOUNTER — Other Ambulatory Visit: Payer: Self-pay

## 2020-11-29 ENCOUNTER — Other Ambulatory Visit
Admit: 2020-11-29 | Discharge: 2020-11-29 | Disposition: A | Discharge status: Home / Self Care | Payer: Medicare PPO | Attending: Neurosurgery | Admitting: Neurosurgery

## 2020-11-29 DIAGNOSIS — D631 Anemia in chronic kidney disease: Secondary | ICD-10-CM | POA: Diagnosis not present

## 2020-11-29 DIAGNOSIS — J449 Chronic obstructive pulmonary disease, unspecified: Secondary | ICD-10-CM | POA: Diagnosis not present

## 2020-11-29 DIAGNOSIS — E785 Hyperlipidemia, unspecified: Secondary | ICD-10-CM | POA: Diagnosis not present

## 2020-11-29 DIAGNOSIS — N189 Chronic kidney disease, unspecified: Secondary | ICD-10-CM | POA: Diagnosis not present

## 2020-11-29 DIAGNOSIS — F32A Depression, unspecified: Secondary | ICD-10-CM | POA: Diagnosis not present

## 2020-11-29 DIAGNOSIS — I2510 Atherosclerotic heart disease of native coronary artery without angina pectoris: Secondary | ICD-10-CM | POA: Diagnosis not present

## 2020-11-29 DIAGNOSIS — I130 Hypertensive heart and chronic kidney disease with heart failure and stage 1 through stage 4 chronic kidney disease, or unspecified chronic kidney disease: Secondary | ICD-10-CM | POA: Diagnosis not present

## 2020-11-29 DIAGNOSIS — I509 Heart failure, unspecified: Secondary | ICD-10-CM | POA: Diagnosis not present

## 2020-11-29 DIAGNOSIS — M069 Rheumatoid arthritis, unspecified: Secondary | ICD-10-CM | POA: Diagnosis not present

## 2020-11-29 HISTORY — DX: Cardiac murmur, unspecified: R01.1

## 2020-11-29 NOTE — Patient Instructions (Addendum)
Your procedure is scheduled on: Wednesday December 11, 2020. Report to Day Surgery inside [facility] 2nd floor. To find out your arrival time please call [phone] between 1PM - 3PM on Tuesday December 10, 2020.  Remember: Instructions that are not followed completely may result in serious medical risk,  up to and including death, or upon the discretion of your surgeon and anesthesiologist your  surgery may need to be rescheduled.     _X__ 1. Do not eat food after midnight the night before your procedure.                 No chewing gum or hard candies. You may drink clear liquids up to 2 hours                 before you are scheduled to arrive for your surgery- DO not drink clear                 liquids within 2 hours of the start of your surgery.                 Clear Liquids include:  water, apple juice without pulp, clear Gatorade, G2 or                  Gatorade Zero (avoid Red/Purple/Blue), Black Coffee or Tea (Do not add                 anything to coffee or tea).  __X__2.  On the morning of surgery brush your teeth with toothpaste and water, you                may rinse your mouth with mouthwash if you wish.  Do not swallow any toothpaste or mouthwash.     _X__ 3.  No Alcohol for 24 hours before or after surgery.   _X__ 4.  Do Not Smoke or use e-cigarettes For 24 Hours Prior to Your Surgery.                 Do not use any chewable tobacco products for at least 6 hours prior to                 Surgery.  _X__  5.  Do not use any recreational drugs (marijuana, cocaine, heroin, ecstasy, MDMA or other)                For at least one week prior to your surgery.  Combination of these drugs with anesthesia                May have life threatening results.  __X__6.  Notify your doctor if there is any change in your medical condition      (cold, fever, infections).     Do not wear jewelry, make-up, hairpins, clips or nail polish. Do not wear lotions,  powders, or perfumes or deodorant. Do not shave 48 hours prior to surgery.  Do not bring valuables to the hospital.    [facility] is not responsible for any belongings or valuables.  Contacts, dentures or bridgework may not be worn into surgery. Leave your suitcase in the car. After surgery it may be brought to your room. For patients admitted to the hospital, discharge time is determined by your treatment team.   Patients discharged the day of surgery will not be allowed to drive home.   Make arrangements for someone to be with you for the first 24 hours of  your Same Day Discharge.    Please read over the following fact sheets that you were given:   Spine Surgery Book    __X__ Take these medicines the morning of surgery with A SIP OF WATER:    1. amLODipine (NORVASC) 2.5 MG  2. escitalopram (LEXAPRO) 10 MG   3. famotidine (PEPCID) 20 MG  4. gabapentin (NEURONTIN) 600 MG  5. lamoTRIgine (LAMICTAL) 100 MG  6. leflunomide (ARAVA) 20 MG  7. nebivolol (BYSTOLIC) 10 MG  ____ Fleet Enema (as directed)   __X__ Use CHG Soap (or wipes) as directed  ____ Use Benzoyl Peroxide Gel as instructed  __X__ Use inhalers on the day of surgery  albuterol (VENTOLIN HFA) 108 (90 Base) MCG/ACT inhaler  ____ Stop metformin 2 days prior to surgery    ____ Take 1/2 of usual insulin dose the night before surgery. No insulin the morning          of surgery.   ____ Call your PCP, cardiologist, or Pulmonologist if taking Coumadin/Plavix/aspirin and ask when to stop before your surgery.   __X__ One Week prior to surgery- Stop Anti-inflammatories such as Ibuprofen, Aleve, Advil, Motrin, meloxicam (MOBIC), diclofenac, etodolac, ketorolac, Toradol, Daypro, piroxicam, Goody's or BC powders. OK TO USE TYLENOL IF NEEDED   __X__ Stop supplements until after surgery.    ____ Bring C-Pap to the hospital.    If you have any questions regarding your pre-procedure instructions,  Please call Pre-admit  Testing at [phone]

## 2020-11-30 DIAGNOSIS — E785 Hyperlipidemia, unspecified: Secondary | ICD-10-CM | POA: Diagnosis not present

## 2020-11-30 DIAGNOSIS — I130 Hypertensive heart and chronic kidney disease with heart failure and stage 1 through stage 4 chronic kidney disease, or unspecified chronic kidney disease: Secondary | ICD-10-CM | POA: Diagnosis not present

## 2020-11-30 DIAGNOSIS — J449 Chronic obstructive pulmonary disease, unspecified: Secondary | ICD-10-CM | POA: Diagnosis not present

## 2020-11-30 DIAGNOSIS — I2510 Atherosclerotic heart disease of native coronary artery without angina pectoris: Secondary | ICD-10-CM | POA: Diagnosis not present

## 2020-11-30 DIAGNOSIS — N189 Chronic kidney disease, unspecified: Secondary | ICD-10-CM | POA: Diagnosis not present

## 2020-11-30 DIAGNOSIS — M069 Rheumatoid arthritis, unspecified: Secondary | ICD-10-CM | POA: Diagnosis not present

## 2020-11-30 DIAGNOSIS — F32A Depression, unspecified: Secondary | ICD-10-CM | POA: Diagnosis not present

## 2020-11-30 DIAGNOSIS — D631 Anemia in chronic kidney disease: Secondary | ICD-10-CM | POA: Diagnosis not present

## 2020-11-30 DIAGNOSIS — I509 Heart failure, unspecified: Secondary | ICD-10-CM | POA: Diagnosis not present

## 2020-12-04 ENCOUNTER — Other Ambulatory Visit: Payer: Self-pay

## 2020-12-04 ENCOUNTER — Telehealth: Payer: Self-pay | Admitting: Internal Medicine

## 2020-12-04 ENCOUNTER — Ambulatory Visit: Payer: Medicare PPO | Admitting: Physician Assistant

## 2020-12-04 ENCOUNTER — Ambulatory Visit: Payer: Self-pay | Admitting: Urology

## 2020-12-04 ENCOUNTER — Ambulatory Visit
Admission: RE | Admit: 2020-12-04 | Discharge: 2020-12-04 | Disposition: A | Discharge status: Home / Self Care | Payer: Medicare PPO | Source: Ambulatory Visit | Attending: Primary Care | Admitting: Primary Care

## 2020-12-04 ENCOUNTER — Ambulatory Visit (INDEPENDENT_AMBULATORY_CARE_PROVIDER_SITE_OTHER): Payer: Medicare PPO | Admitting: Primary Care

## 2020-12-04 ENCOUNTER — Encounter: Payer: Self-pay | Admitting: Neurosurgery

## 2020-12-04 ENCOUNTER — Encounter: Payer: Self-pay | Admitting: Physician Assistant

## 2020-12-04 ENCOUNTER — Encounter: Payer: Self-pay | Admitting: Primary Care

## 2020-12-04 ENCOUNTER — Telehealth: Payer: Self-pay

## 2020-12-04 VITALS — BP 103/66 | HR 96 | Ht 61.0 in | Wt 183.0 lb

## 2020-12-04 VITALS — BP 120/70 | HR 96 | Temp 97.1°F | Ht 61.0 in | Wt 173.6 lb

## 2020-12-04 DIAGNOSIS — J449 Chronic obstructive pulmonary disease, unspecified: Secondary | ICD-10-CM

## 2020-12-04 DIAGNOSIS — N3941 Urge incontinence: Secondary | ICD-10-CM

## 2020-12-04 DIAGNOSIS — Z01818 Encounter for other preprocedural examination: Secondary | ICD-10-CM

## 2020-12-04 DIAGNOSIS — G4734 Idiopathic sleep related nonobstructive alveolar hypoventilation: Secondary | ICD-10-CM | POA: Diagnosis not present

## 2020-12-04 DIAGNOSIS — J9811 Atelectasis: Secondary | ICD-10-CM | POA: Diagnosis not present

## 2020-12-04 DIAGNOSIS — J4489 Other specified chronic obstructive pulmonary disease: Secondary | ICD-10-CM

## 2020-12-04 DIAGNOSIS — S2231XA Fracture of one rib, right side, initial encounter for closed fracture: Secondary | ICD-10-CM | POA: Diagnosis not present

## 2020-12-04 LAB — BLADDER SCAN AMB NON-IMAGING

## 2020-12-04 MED ORDER — MIRABEGRON ER 50 MG PO TB24: 50.0000 mg | ORAL_TABLET | Freq: Every day | ORAL | 3 refills | Status: DC

## 2020-12-04 NOTE — Progress Notes (Signed)
12/04/2020 2:49 PM   Kerri Carter Kerri Carter [birth year] [id number]  CC: Chief Complaint  Patient presents with   Urinary Incontinence   Follow-up    HPI: Kerri Carter is a 80 y.o. female with PMH solitary left kidney, possible left renal mass not visualized on follow-up imaging, and OAB wet on Myrbetriq 50 mg daily who presents today for annual follow-up and PVR.   Today she reports continued symptomatic improvement on Myrbetriq 50 mg daily.  She states she feels the medication is most helpful for her when she takes it in the morning.  No acute concerns today.  PVR 40 mL.  PMH: Past Medical History:  Diagnosis Date   Anxiety    Aortic stenosis 07/09/2015   a.) TTE 07/06/2015: EF 55-60%; mild AS with MPG 13.0 mmHg.   Asthma    Bipolar disorder ([place])    C. difficile diarrhea 2010   a.) following ABX course during hospital admission   Carotid atherosclerosis, bilateral    a.) Moderate; < 50% stenosis BILATERAL ICAs.   Cataract    a.) s/p BILATERAL extraction   Chicken pox    CKD (chronic kidney disease), stage III (HCC)    a. s/p R nephrectomy./ aneurysm   Conversion disorder    COPD (chronic obstructive pulmonary disease) (HCC)    Depression    Essential hypertension    GERD (gastroesophageal reflux disease)    Heart murmur    Hyperlipidemia    ILD (interstitial lung disease) (HCC)    mild; 2/2 RA diagnosis   Inflammatory arthritis    a. hands/carpal tunnel.  b. Low titer rheumatoid factor. c. Negative anti-CCP antibodies. d. Plaquenil.   Macular degeneration    Nocturnal hypoxemia    Non-Obstructive CAD    a. 07/2009 Cath ([facility]): nonobs dzs;  b. 03/2011 Cath [facility]): nonobs dzs.   Osteoarthritis    a. Knees.   PAD (peripheral artery disease) (HCC)    PUD (peptic ulcer disease)    S/P right hip fracture    11/01/16 s/p repair   Shoulder pain    Sleep apnea    no cpap / minimal   Spinal stenosis at L4-L5 level    severe with L4/L5 anterolisthesis grade 1 anterolisthesis     Toxic maculopathy    Valvular heart disease    a.) TTE 07/06/2015: EF 55-60%; Mild MR/AR/TR; Mild AS with MPG 13.0 mmHg.    Surgical History: Past Surgical History:  Procedure Laterality Date   APPENDECTOMY     BACK SURGERY     lumbar fusion   BUNIONECTOMY Right    CATARACT EXTRACTION, BILATERAL     CESAREAN SECTION     x1   CHOLECYSTECTOMY N/A 05/11/2016   Procedure: LAPAROSCOPIC CHOLECYSTECTOMY;  Surgeon: Florene Glen, MD;  Location: [facility] ORS;  Service: General;  Laterality: N/A;   COLONOSCOPY WITH PROPOFOL N/A 04/02/2016   Procedure: COLONOSCOPY WITH PROPOFOL;  Surgeon: Jonathon Bellows, MD;  Location: [facility] ENDOSCOPY;  Service: Endoscopy;  Laterality: N/A;   ENDOSCOPIC RETROGRADE CHOLANGIOPANCREATOGRAPHY (ERCP) WITH PROPOFOL N/A 05/08/2016   Procedure: ENDOSCOPIC RETROGRADE CHOLANGIOPANCREATOGRAPHY (ERCP) WITH PROPOFOL;  Surgeon: Lucilla Lame, MD;  Location: [facility] ENDOSCOPY;  Service: Endoscopy;  Laterality: N/A;   ERCP     with biliary spincterotomy 05/08/16 Dr. Allen Norris for choledocholithiasis    ESOPHAGEAL DILATION  04/02/2016   Procedure: ESOPHAGEAL DILATION;  Surgeon: Jonathon Bellows, MD;  Location: [facility] ENDOSCOPY;  Service: Endoscopy;;   ESOPHAGOGASTRODUODENOSCOPY (EGD) WITH PROPOFOL N/A 04/02/2016   Procedure: ESOPHAGOGASTRODUODENOSCOPY (EGD)  WITH PROPOFOL;  Surgeon: Jonathon Bellows, MD;  Location: [facility] ENDOSCOPY;  Service: Endoscopy;  Laterality: N/A;   HIP ARTHROPLASTY Right 11/01/2016   Procedure: ARTHROPLASTY BIPOLAR HIP (HEMIARTHROPLASTY);  Surgeon: Corky Mull, MD;  Location: [facility] ORS;  Service: Orthopedics;  Laterality: Right;   NEPHRECTOMY  1988   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   ORIF SCAPHOID FRACTURE Left 12/21/2019   Procedure: OPEN REDUCTION INTERNAL FIXATION (ORIF) OF LEFT SCAPULAR NONUNION WITH BONE GRAFT;  Surgeon: Corky Mull, MD;  Location: [facility] ORS;  Service: Orthopedics;  Laterality: Left;   osteoporosis     noted DEXA 08/19/16    REPLACEMENT  TOTAL KNEE Right    REVERSE SHOULDER ARTHROPLASTY Right 11/04/2017   Procedure: REVERSE SHOULDER ARTHROPLASTY;  Surgeon: Corky Mull, MD;  Location: [facility] ORS;  Service: Orthopedics;  Laterality: Right;   REVERSE SHOULDER ARTHROPLASTY Left 07/26/2018   Procedure: REVERSE SHOULDER ARTHROPLASTY;  Surgeon: Corky Mull, MD;  Location: [facility] ORS;  Service: Orthopedics;  Laterality: Left;   TONSILLECTOMY     TOTAL HIP ARTHROPLASTY  12/10/11   [facility] left hip   TOTAL HIP ARTHROPLASTY Bilateral    TUBAL LIGATION      Home Medications:  Allergies as of 12/04/2020       Reactions   Ceftin [cefuroxime Axetil] Anaphylaxis   Lisinopril Anaphylaxis   Sulfa Antibiotics Other (See Comments)   Face swelling   Sulfasalazine Anaphylaxis   Morphine Other (See Comments)   Per patient, low blood pressure issues that requires action to raise it back up. Can take small infrequent doses   Xarelto [rivaroxaban] Other (See Comments)   Stomach burning, bleeding, and tar in stool   Adhesive [tape] Rash   Paper tape and tega derm OK   Antihistamines, Chlorpheniramine-type Other (See Comments)   Makes pt hyper   Antivert [meclizine Hcl] Other (See Comments)   Bladder will not empty   Aspirin Other (See Comments)   Sulfasalazine allergy cross reacts   Contrast Media [iodinated Diagnostic Agents] Rash   she is able to use betadine scrubs.   Decongestant [pseudoephedrine Hcl] Other (See Comments)   Makes pt hyper   Doxycycline Other (See Comments)   GI upset   Levaquin [levofloxacin In D5w] Rash   Polymyxin B Other (See Comments)   Facial rash   Tetanus Toxoids Rash, Other (See Comments)   Fever and hot to touch at injection site        Medication List        Accurate as of December 04, 2020  2:49 PM. If you have any questions, ask your nurse or doctor.          Adalimumab 40 MG/0.4ML Pnkt Inject 40 mg into the skin every 14 (fourteen) days.   albuterol 108 (90 Base) MCG/ACT inhaler Commonly  known as: Ventolin HFA Inhale 2 puffs into the lungs every 6 (six) hours as needed for wheezing or shortness of breath.   ALPRAZolam 0.25 MG tablet Commonly known as: XANAX TAKE 1 TABLET BY MOUTH AT BEDTIME AS NEEDED FOR ANXIETY.   amLODipine 2.5 MG tablet Commonly known as: NORVASC Take 1 tablet (2.5 mg total) by mouth daily.   benzonatate 200 MG capsule Commonly known as: TESSALON Take 1 capsule (200 mg total) by mouth 3 (three) times daily as needed for cough.   budesonide 0.25 MG/2ML nebulizer solution Commonly known as: PULMICORT USE 1 VIAL  IN  NEBULIZER TWICE  DAILY - rinse mouth  after treatment   cyclobenzaprine 10 MG tablet Commonly known as: FLEXERIL Take 1 tablet (10 mg total) by mouth 3 (three) times daily as needed for muscle spasms.   dicyclomine 10 MG capsule Commonly known as: BENTYL Take 1 capsule (10 mg total) by mouth 2 (two) times daily as needed for spasms.   escitalopram 10 MG tablet Commonly known as: LEXAPRO TAKE 1 TABLET BY MOUTH EVERY DAY   famotidine 20 MG tablet Commonly known as: PEPCID TAKE 1 TABLET (20 MG TOTAL) BY MOUTH DAILY. BEFORE BREAKFAST OR DINNER   fluticasone 50 MCG/ACT nasal spray Commonly known as: Flonase Place 2 sprays into both nostrils daily. In am   gabapentin 300 MG capsule Commonly known as: NEURONTIN Take 2 capsules (600 mg total) by mouth in the morning and at bedtime.   guaiFENesin 600 MG 12 hr tablet Commonly known as: MUCINEX Take 1 tablet (600 mg total) by mouth 2 (two) times daily.   HYDROcodone-acetaminophen 5-325 MG tablet Commonly known as: NORCO/VICODIN Take 1 tablet by mouth every 6 (six) hours as needed for moderate pain.   ipratropium-albuterol 0.5-2.5 (3) MG/3ML Soln Commonly known as: DUONEB TAKE 3 MLS BY NEBULIZATION 3 (THREE) TIMES DAILY AS NEEDED. What changed: when to take this   lamoTRIgine 100 MG tablet Commonly known as: LAMICTAL TAKE 1 TAB 2 TIMES DAILY. FURTHER REFILLS NEW PSYCH FOR ALL  PSYCH MEDS ONLY TEMP SUPPLY FROM PCP   leflunomide 20 MG tablet Commonly known as: ARAVA Take 1 tablet (20 mg total) by mouth daily.   lidocaine 5 % Commonly known as: Lidoderm Place 1 patch onto the skin 2 (two) times daily as needed. Remove & Discard patch within 12 hours or as directed by MD   lovastatin 20 MG tablet Commonly known as: MEVACOR TAKE 1 TABLET BY MOUTH EVERYDAY AT BEDTIME *STOP TALKING 40MG *   montelukast 10 MG tablet Commonly known as: SINGULAIR TAKE 1 TABLET BY MOUTH EVERY DAY   Myrbetriq 50 MG Tb24 tablet Generic drug: mirabegron ER TAKE 1 TABLET BY MOUTH EVERYDAY AT BEDTIME   nebivolol 10 MG tablet Commonly known as: Bystolic Take 0.5 tablets (5 mg total) by mouth in the morning and at bedtime.   oxyCODONE-acetaminophen 5-325 MG tablet Commonly known as: Percocet Take 1 tablet by mouth every 4 (four) hours as needed for severe pain.   pantoprazole 40 MG tablet Commonly known as: PROTONIX TAKE 1 TABLET BY MOUTH 2 TIMES DAILY 30 MIN BEFORE FOOD (NOTE REDUCTION IN FREQUENCY)   predniSONE 5 MG tablet Commonly known as: DELTASONE Take 40mg  po daily for 2 days then 30mg  daily for 2 days then 20mg  daily for 2 days then 10mg  daily for 2 days then 7.5mg  daily   QUEtiapine 25 MG tablet Commonly known as: SEROQUEL TAKE 1 TABLET (25 MG TOTAL) BY MOUTH AT BEDTIME. AGAIN LAST FILL FURTHER REFILLS FROM PSYCHIATRY NO EXCEPTIONS   sucralfate 1 g tablet Commonly known as: CARAFATE Take 1 tablet (1 g total) by mouth 2 (two) times daily.   zoledronic acid 5 MG/100ML Soln injection Commonly known as: RECLAST Inject 5 mg into the vein once.        Allergies:  Allergies  Allergen Reactions   Ceftin [Cefuroxime Axetil] Anaphylaxis   Lisinopril Anaphylaxis   Sulfa Antibiotics Other (See Comments)    Face swelling   Sulfasalazine Anaphylaxis   Morphine Other (See Comments)    Per patient, low blood pressure issues that requires action to raise it back  up. Can  take small infrequent doses   Xarelto [Rivaroxaban] Other (See Comments)    Stomach burning, bleeding, and tar in stool   Adhesive [Tape] Rash    Paper tape and tega derm OK   Antihistamines, Chlorpheniramine-Type Other (See Comments)    Makes pt hyper   Antivert [Meclizine Hcl] Other (See Comments)    Bladder will not empty   Aspirin Other (See Comments)    Sulfasalazine allergy cross reacts   Contrast Media [Iodinated Diagnostic Agents] Rash    she is able to use betadine scrubs.   Decongestant [Pseudoephedrine Hcl] Other (See Comments)    Makes pt hyper   Doxycycline Other (See Comments)    GI upset   Levaquin [Levofloxacin In D5w] Rash   Polymyxin B Other (See Comments)    Facial rash   Tetanus Toxoids Rash and Other (See Comments)    Fever and hot to touch at injection site    Family History: Family History  Problem Relation Age of Onset   Rheum arthritis Mother    Asthma Mother    Parkinson's disease Mother    Heart disease Mother    Stroke Mother    Hypertension Mother    Heart attack Father    Heart disease Father    Hypertension Father    Peripheral Artery Disease Father    Diabetes Son    Gout Son    Asthma Sister    Heart disease Sister    Lung cancer Sister    Heart disease Sister    Heart disease Sister    Breast cancer Sister    Heart attack Sister    Heart disease Brother    Heart disease Maternal Grandmother    Diabetes Maternal Grandmother    Colon cancer Maternal Grandmother    Cancer Maternal Grandmother        Hodgkins lymphoma   Heart disease Brother    Alcohol abuse Brother    Depression Brother    Dementia Son     Social History:   reports that she quit smoking about 46 years ago. Her smoking use included cigarettes. She has a 10.00 pack-year smoking history. She has never used smokeless tobacco. She reports that she does not drink alcohol and does not use drugs.  Physical Exam: BP 103/66   Pulse 96   Ht 5\' 1"  (1.549 m)    Wt 183 lb (83 kg)   BMI 34.58 kg/m   Constitutional:  Alert and oriented, no acute distress, nontoxic appearing HEENT: [place], AT Cardiovascular: No clubbing, cyanosis, or edema Respiratory: Normal respiratory effort, no increased work of breathing Skin: No rashes, bruises or suspicious lesions Neurologic: Grossly intact, no focal deficits, moving all 4 extremities Psychiatric: Normal mood and affect  Laboratory Data: Results for orders placed or performed in visit on 12/04/20  Bladder Scan (Post Void Residual) in office  Result Value Ref Range   Scan Result 25mL    Assessment & Plan:   1. Urge incontinence Well-controlled on Myrbetriq 50 mg daily.  She is emptying appropriately.  We will plan to continue the medication with plans for annual visits moving forward.  We discussed return precautions today including decreased therapeutic effect versus acute urinary symptoms. - Bladder Scan (Post Void Residual) in office - mirabegron ER (MYRBETRIQ) 50 MG TB24 tablet; Take 1 tablet (50 mg total) by mouth daily.  Dispense: 90 tablet; Refill: 3   Return in about 1 year (around 12/04/2021) for Annual OAB f/u with PVR.  Kerri Loop,  PA-C  [facility] [address], [place], [place] [address] [phone]

## 2020-12-04 NOTE — Progress Notes (Signed)
@Patient  ID: Kerri Carter, female    DOB: March 19, 1940, 80 y.o.   MRN: [id number]  No chief complaint on file.   Referring provider: McLean-Scocuzza, Olivia Mackie *  HPI: 80 year old female, former smoker quit in [place] (20 pack year hx). PMH significant for COPD mixed disease, interstitial pulmonary disease, NSIP, pleural effusion on left, respiratory failure with hypoxia, HTN, valvular hear disease, CAD, rheumatoid arthritis, osteoporosis. Patient of Dr. Patsey Berthold, last seen on 11/14/20.   12/04/2020 Patient presents today for surgical risk assessment for upcoming surgery with  Dr. [name] for L2-3 discectomy and L3-4 and L5-S1 disc decompression. She has hx COPD overlap syndrome, nocturnal hypoxemia and NSIP due to rheumatoid. She is doing better respiratory wise. Last exacerbation was on September 27th, 2022 requiring hospitalization. She has chronic dyspnea with exertion. She is not able to do most ADLs. She ambulates with walker. She uses 2L oxygen at night. She uses budesonide and ipratropium-albuterol twice a day and takes prednisone 5mg  daily. Denies cough, chest tightness or wheezing.   Allergies  Allergen Reactions   Ceftin [Cefuroxime Axetil] Anaphylaxis   Lisinopril Anaphylaxis   Sulfa Antibiotics Other (See Comments)    Face swelling   Sulfasalazine Anaphylaxis   Morphine Other (See Comments)    Per patient, low blood pressure issues that requires action to raise it back up. Can take small infrequent doses   Xarelto [Rivaroxaban] Other (See Comments)    Stomach burning, bleeding, and tar in stool   Adhesive [Tape] Rash    Paper tape and tega derm OK   Antihistamines, Chlorpheniramine-Type Other (See Comments)    Makes pt hyper   Antivert [Meclizine Hcl] Other (See Comments)    Bladder will not empty   Aspirin Other (See Comments)    Sulfasalazine allergy cross reacts   Contrast Media [Iodinated Contrast Media] Rash    she is able to use betadine scrubs.   Decongestant  [Pseudoephedrine Hcl] Other (See Comments)    Makes pt hyper   Doxycycline Other (See Comments)    GI upset   Levaquin [Levofloxacin In D5w] Rash   Polymyxin B Other (See Comments)    Facial rash   Tetanus Toxoids Rash and Other (See Comments)    Fever and hot to touch at injection site    Immunization History  Administered Date(s) Administered   Fluad Quad(high Dose 65+) 10/03/2018, 10/16/2019, 11/22/2020   Influenza Split 10/04/2012, 10/25/2013, 10/28/2016   Influenza Whole 11/03/2011   Influenza, High Dose Seasonal PF 10/04/2015, 11/03/2016   Influenza,inj,Quad PF,6+ Mos 10/31/2014   Influenza-Unspecified 01/02/2010, 11/24/2011, 10/21/2013, 10/31/2014, 09/28/2017   Pneumococcal Conjugate-13 10/21/2013   Pneumococcal Polysaccharide-23 11/07/2015    Past Medical History:  Diagnosis Date   Anxiety    Aortic stenosis 07/09/2015   a.) TTE 07/06/2015: EF 55-60%; mild AS with MPG 13.0 mmHg.   Asthma    Bipolar disorder ([place])    C. difficile diarrhea 2010   a.) following ABX course during hospital admission   Carotid atherosclerosis, bilateral    a.) Moderate; < 50% stenosis BILATERAL ICAs.   Cataract    a.) s/p BILATERAL extraction   Chicken pox    CKD (chronic kidney disease), stage III (HCC)    a. s/p R nephrectomy./ aneurysm   Conversion disorder    COPD (chronic obstructive pulmonary disease) (HCC)    Depression    Essential hypertension    GERD (gastroesophageal reflux disease)    Heart murmur    Hyperlipidemia    ILD (  interstitial lung disease) ([place])    mild; 2/2 RA diagnosis   Inflammatory arthritis    a. hands/carpal tunnel.  b. Low titer rheumatoid factor. c. Negative anti-CCP antibodies. d. Plaquenil.   Macular degeneration    Nocturnal hypoxemia    Non-Obstructive CAD    a. 07/2009 Cath ([facility]): nonobs dzs;  b. 03/2011 Cath [facility]): nonobs dzs.   Osteoarthritis    a. Knees.   PAD (peripheral artery disease) (HCC)    PUD (peptic ulcer disease)    S/P right  hip fracture    11/01/16 s/p repair   Shoulder pain    Sleep apnea    no cpap / minimal   Spinal stenosis at L4-L5 level    severe with L4/L5 anterolisthesis grade 1 anterolisthesis    Toxic maculopathy    Valvular heart disease    a.) TTE 07/06/2015: EF 55-60%; Mild MR/AR/TR; Mild AS with MPG 13.0 mmHg.    Tobacco History: Social History   Tobacco Use  Smoking Status Former   Packs/day: 0.50   Years: 20.00   Pack years: 10.00   Types: Cigarettes   Quit date: 02/02/1974   Years since quitting: 47.0  Smokeless Tobacco Never   Counseling given: Not Answered   Outpatient Medications Prior to Visit  Medication Sig Dispense Refill   Adalimumab 40 MG/0.4ML PNKT Inject 40 mg into the skin every 14 (fourteen) days.     albuterol (VENTOLIN HFA) 108 (90 Base) MCG/ACT inhaler Inhale 2 puffs into the lungs every 6 (six) hours as needed for wheezing or shortness of breath. 18 g 11   amLODipine (NORVASC) 2.5 MG tablet Take 1 tablet (2.5 mg total) by mouth daily. 90 tablet 3   benzonatate (TESSALON) 200 MG capsule Take 1 capsule (200 mg total) by mouth 3 (three) times daily as needed for cough. 40 capsule 0   budesonide (PULMICORT) 0.25 MG/2ML nebulizer solution USE 1 VIAL  IN  NEBULIZER TWICE  DAILY - rinse mouth after treatment 60 mL 11   cyclobenzaprine (FLEXERIL) 10 MG tablet Take 1 tablet (10 mg total) by mouth 3 (three) times daily as needed for muscle spasms. 30 tablet 0   dicyclomine (BENTYL) 10 MG capsule Take 1 capsule (10 mg total) by mouth 2 (two) times daily as needed for spasms. (Patient taking differently: Take 20 mg by mouth 2 (two) times daily.) 180 capsule 3   escitalopram (LEXAPRO) 10 MG tablet TAKE 1 TABLET BY MOUTH EVERY DAY 90 tablet 1   famotidine (PEPCID) 20 MG tablet TAKE 1 TABLET (20 MG TOTAL) BY MOUTH DAILY. BEFORE BREAKFAST OR DINNER 90 tablet 3   fluticasone (FLONASE) 50 MCG/ACT nasal spray Place 2 sprays into both nostrils daily. In am     gabapentin (NEURONTIN)  300 MG capsule Take 2 capsules (600 mg total) by mouth in the morning and at bedtime. 360 capsule 3   ipratropium-albuterol (DUONEB) 0.5-2.5 (3) MG/3ML SOLN TAKE 3 MLS BY NEBULIZATION 3 (THREE) TIMES DAILY AS NEEDED. (Patient taking differently: Take 3 mLs by nebulization 2 (two) times daily.) 360 mL 3   lamoTRIgine (LAMICTAL) 100 MG tablet TAKE 1 TAB 2 TIMES DAILY. FURTHER REFILLS NEW PSYCH FOR ALL PSYCH MEDS ONLY TEMP SUPPLY FROM PCP (Patient taking differently: Take 100 mg by mouth 2 (two) times daily. TAKE 1 TAB 2 TIMES DAILY. FURTHER REFILLS NEW PSYCH FOR ALL PSYCH MEDS ONLY TEMP SUPPLY FROM PCP) 180 tablet 1   leflunomide (ARAVA) 20 MG tablet Take 1 tablet (20 mg total)  by mouth daily.     lidocaine (LIDODERM) 5 % Place 1 patch onto the skin 2 (two) times daily as needed. Remove & Discard patch within 12 hours or as directed by MD (Patient taking differently: Place 1 patch onto the skin daily. Remove & Discard patch within 12 hours or as directed by MD) 120 patch 2   lovastatin (MEVACOR) 20 MG tablet TAKE 1 TABLET BY MOUTH EVERYDAY AT BEDTIME *STOP TALKING 40MG * (Patient taking differently: Take 20 mg by mouth at bedtime.) 90 tablet 3   montelukast (SINGULAIR) 10 MG tablet TAKE 1 TABLET BY MOUTH EVERY DAY 90 tablet 2   nebivolol (BYSTOLIC) 10 MG tablet Take 0.5 tablets (5 mg total) by mouth in the morning and at bedtime.     QUEtiapine (SEROQUEL) 25 MG tablet TAKE 1 TABLET (25 MG TOTAL) BY MOUTH AT BEDTIME. AGAIN LAST FILL FURTHER REFILLS FROM PSYCHIATRY NO EXCEPTIONS 90 tablet 0   sucralfate (CARAFATE) 1 g tablet Take 1 tablet (1 g total) by mouth 2 (two) times daily. 180 tablet 3   zoledronic acid (RECLAST) 5 MG/100ML SOLN injection Inject 5 mg into the vein once.     ALPRAZolam (XANAX) 0.25 MG tablet TAKE 1 TABLET BY MOUTH AT BEDTIME AS NEEDED FOR ANXIETY. 10 tablet 0   guaiFENesin (MUCINEX) 600 MG 12 hr tablet Take 1 tablet (600 mg total) by mouth 2 (two) times daily. (Patient not taking:  Reported on 01/13/2021)     HYDROcodone-acetaminophen (NORCO/VICODIN) 5-325 MG tablet Take 1 tablet by mouth every 6 (six) hours as needed for moderate pain. 30 tablet 0   MYRBETRIQ 50 MG TB24 tablet TAKE 1 TABLET BY MOUTH EVERYDAY AT BEDTIME 30 tablet 11   oxyCODONE-acetaminophen (PERCOCET) 5-325 MG tablet Take 1 tablet by mouth every 4 (four) hours as needed for severe pain. 12 tablet 0   pantoprazole (PROTONIX) 40 MG tablet TAKE 1 TABLET BY MOUTH 2 TIMES DAILY 30 MIN BEFORE FOOD (NOTE REDUCTION IN FREQUENCY) 180 tablet 3   predniSONE (DELTASONE) 5 MG tablet Take 40mg  po daily for 2 days then 30mg  daily for 2 days then 20mg  daily for 2 days then 10mg  daily for 2 days then 7.5mg  daily     No facility-administered medications prior to visit.   Review of Systems  Review of Systems  Constitutional: Negative.   HENT: Negative.    Respiratory:  Negative for cough, shortness of breath and wheezing.        Chronic dyspnea  Cardiovascular: Negative.     Physical Exam  BP 120/70 (BP Location: Right Arm, Patient Position: Sitting, Cuff Size: Normal)   Pulse 96   Temp (!) 97.1 F (36.2 C) (Oral)   Ht 5\' 1"  (1.549 m)   Wt 173 lb 9.6 oz (78.7 kg)   SpO2 96%   BMI 32.80 kg/m  Physical Exam Constitutional:      Appearance: Normal appearance.  HENT:     Head: Normocephalic and atraumatic.     Mouth/Throat:     Comments: Deferred d/t masking Cardiovascular:     Rate and Rhythm: Normal rate and regular rhythm.  Pulmonary:     Effort: Pulmonary effort is normal.     Breath sounds: No wheezing, rhonchi or rales.  Neurological:     General: No focal deficit present.     Mental Status: She is alert and oriented to person, place, and time. Mental status is at baseline.  Psychiatric:        Mood and Affect:  Mood normal.        Behavior: Behavior normal.        Thought Content: Thought content normal.        Judgment: Judgment normal.     Lab Results:  CBC    Component Value Date/Time    WBC 11.2 (H) 01/13/2021 0628   RBC 4.17 01/13/2021 0628   HGB 11.5 (L) 01/13/2021 0628   HGB 11.6 (L) 11/24/2013 1622   HCT 36.9 01/13/2021 0628   HCT 37.0 11/24/2013 1622   PLT 242 01/13/2021 0628   PLT 188 11/24/2013 1622   MCV 88.5 01/13/2021 0628   MCV 85 11/24/2013 1622   MCH 27.6 01/13/2021 0628   MCHC 31.2 01/13/2021 0628   RDW 15.0 01/13/2021 0628   RDW 14.5 11/24/2013 1622   LYMPHSABS 1,652 11/22/2020 1549   LYMPHSABS 1.4 07/22/2013 0549   MONOABS 1.0 10/29/2020 1322   MONOABS 0.8 07/22/2013 0549   EOSABS 205 11/22/2020 1549   EOSABS 0.3 07/22/2013 0549   BASOSABS 43 11/22/2020 1549   BASOSABS 0.0 07/22/2013 0549    BMET    Component Value Date/Time   NA 133 (L) 01/13/2021 0628   NA 140 11/24/2013 1622   K 4.1 01/13/2021 0628   K 3.7 11/24/2013 1622   CL 98 01/13/2021 0628   CL 105 11/24/2013 1622   CO2 27 01/13/2021 0628   CO2 31 11/24/2013 1622   GLUCOSE 257 (H) 01/13/2021 0628   GLUCOSE 115 (H) 11/24/2013 1622   BUN 18 01/13/2021 0628   BUN 13 11/24/2013 1622   CREATININE 1.27 (H) 01/13/2021 0628   CREATININE 1.48 (H) 11/22/2020 1549   CALCIUM 8.5 (L) 01/13/2021 0628   CALCIUM 9.0 11/24/2013 1622   GFRNONAA 43 (L) 01/13/2021 0628   GFRNONAA 44 (L) 11/24/2013 1622   GFRNONAA 58 (L) 07/22/2013 0549   GFRAA 39 (L) 02/21/2019 1627   GFRAA 54 (L) 11/24/2013 1622   GFRAA >60 07/22/2013 0549    BNP    Component Value Date/Time   BNP 238.5 (H) 11/02/2020 0510    ProBNP No results found for: PROBNP  Imaging: DG Chest 2 View  Result Date: 01/12/2021 CLINICAL DATA:  Productive cough, hemoptysis. EXAM: CHEST - 2 VIEW COMPARISON:  Chest radiograph dated December 04, 2020 FINDINGS: The heart size and mediastinal contours are within normal limits. Hyperinflated lungs with increase in AP diameter of the chest. No focal consolidation or pleural effusion. Chronic healed right-sided rib fractures. Bilateral shoulder arthroplasty. IMPRESSION: COPD without  evidence of acute cardiopulmonary process. Electronically Signed   By: Keane Police D.O.   On: 01/12/2021 13:57   US Venous Img Lower Bilateral (DVT)  Result Date: 01/12/2021 CLINICAL DATA:  Bilateral lower extremity pain. EXAM: Bilateral LOWER EXTREMITY VENOUS DOPPLER ULTRASOUND TECHNIQUE: Gray-scale sonography with compression, as well as color and duplex ultrasound, were performed to evaluate the deep venous system(s) from the level of the common femoral vein through the popliteal and proximal calf veins. COMPARISON:  Ultrasound dated 12/18/2016. FINDINGS: VENOUS Normal compressibility of the common femoral, superficial femoral, and popliteal veins, as well as the visualized calf veins. Visualized portions of profunda femoral vein and great saphenous vein unremarkable. No filling defects to suggest DVT on grayscale or color Doppler imaging. Doppler waveforms show normal direction of venous flow, normal respiratory plasticity and response to augmentation. Limited views of the contralateral common femoral vein are unremarkable. OTHER None. Limitations: none IMPRESSION: Negative. Electronically Signed   By: Laren Everts.D.  On: 01/12/2021 23:37     Assessment & Plan:   Asthma-COPD overlap syndrome (HCC) - Stable interval; Chronic dyspnea symptoms with no acute complaints. Last exacerbation was in September requiring hospitalization. She is complaint with Pulmicort BID and Ipratropium-albuterol nebulizer TID prn breakthrough symptoms. From pulmonary standpoint she is optimized for surgery with Dr. [name]. Pre-op CXR shwoed no acute findings. She is considered medium risk for prolonged mech ventilation and/or post op pulmonary complications d.t hx COPD/sthma overlap, ILD and nocturnal hypoxemia. Ultimate clearance will be decided by surgeon.   Nocturnal hypoxemia - Continue 2L oxygen at night   Martyn Ehrich, NP 01/27/2021

## 2020-12-04 NOTE — Telephone Encounter (Signed)
Kerri Carter contacted the office today in regards to the pt's medical equipment they have received. She is requesting the office visit info explaining what the equipment is specifically for for PT.   Callback number for [name] is [phone]

## 2020-12-04 NOTE — Progress Notes (Signed)
Perioperative Services  Pre-Admission/Anesthesia Testing Clinical Review  Date: 12/06/20  Patient Demographics:  Name: Kerri Carter DOB:   09-02-1940 MRN:   [id number]  Planned Surgical Procedure(s):    Case: [id number] Date/Time: 12/11/20 0934   Procedure: LEFT L2-3 MICRODISCECTOMY, L3-4 AND L5-S1 DECOMPRESSION   Anesthesia type: General   Pre-op diagnosis:      Spinal stenosis of lumbar region with neurogenic claudication M48.062     Lumbar radiculopathy M54.16   Location: [facility] OR ROOM 03 / [facility]   Surgeons: Meade Maw, MD   NOTE: Available PAT nursing documentation and vital signs have been reviewed. Clinical nursing staff has updated patient's PMH/PSHx, current medication list, and drug allergies/intolerances to ensure comprehensive history available to assist in medical decision making as it pertains to the aforementioned surgical procedure and anticipated anesthetic course. Extensive review of available clinical information performed. Kerri Carter PMH and PSHx updated with any diagnoses/procedures that  may have been inadvertently omitted during her intake with the pre-admission testing department's nursing staff.  Clinical Discussion:  Kerri Carter is a 80 y.o. female who is submitted for pre-surgical anesthesia review and clearance prior to her undergoing the above procedure. Former Smoker (10 pack years; quit 02/1974). Pertinent PMH includes: CAD, valvular heart disease, AS, HTN, HLD, PAD, COPD, asthma, OSAH (no nocturnal PAP therapy), mild ILD, CKD-III (s/p RIGHT nephrectomy), GERD (on daily H2 blocker and PPI), PUD (on daily anti-ulcer), OA, RA, lumbar spinal stenosis, conversion disorder, bipolar disorder, anxiety (on BZO), depression  Patient is followed by cardiology (Povsic, PA-C). She was last seen in the cardiology clinic on 04/04/2020; notes reviewed. At the time of her clinic visit, the patient denied any chest pain, shortness of breath, PND,  orthopnea, palpitations, significant peripheral edema, or presyncope/syncope. Patient with intermitted issues with dizziness. Systolic blood pressure running between the 120s and 130s at home on currently prescribed CCB and beta-blocker regimen.  Patient is on a statin for her HLD.  TTE performed on 10/06/2019 revealed normal left ventricular systolic function with trivial to mild valvular insufficiency; LVEF >55%. Patient denies any recent changes to her medical history.  No changes were made to her medication regimen.  Patient scheduled to follow-up with outpatient cardiology in 6 months.  Additionally, patient is followed by pulmonary medicine Kerri Berthold, MD).  She was last seen in the pulmonology clinic on 09/06/2020; notes reviewed.  At the time of her clinic visit patient reported increased cough and shortness of breath occurring at night and in the morning. Patient with asthma/COPD overlap syndrome with mild ILS related to her RA diagnosis. Patient was not using the prescribed Duonebs and budesonide SVNs. Cough had been productive of yellow sputum. She was recently been treated with a  course of amoxicillin-clavulanate by her PCP. Patient compliant with prescribed nocturnal supplemental oxygen and notes that she feel likes she breathes better during the day when she usees her oxygen the night before. Exam revealed coarse breath sounds throughout with bibasilar faint dry crackles (L>R).  Patient denies any acute changes to her medical history. No changes were made to her medication regimen beyond her being encouraged to use her prescribed MDIs.  Patient scheduled follow-up with pulmonary medicine in 4-6 weeks.  Kerri Carter is scheduled for an LEFT L2-3 MICRODISCECTOMY, L3-4 AND L5-S1 DECOMPRESSION on 12/11/2020 with Dr. Meade Maw, MD. Given patient's past medical history significant for cardiovascular/cardiopulmonary diagnoses, presurgical clearances were sought from her primary cardiologist and  pulmonary medicine providers. Specialty clearances  were issued as follows:  Per cardiology, "this patient is considered optimized for surgery and is at an ACCEPTABLE risk to proceed.  She does not have known CAD and is on Plavix through neurology, as she does not need to be on this medication from a cardiac standpoint.  Clearance for holding this medication will need to come from neurology".  Per pulmonary medicine, "this patient is optimized for surgery and may proceed with the planned procedural course with a MODERATE risk of significant perioperative pulmonary complications". Pre-operative CXR was recommended; done on 12/04/2020.  Per neurology, "Kerri Carter continues on Plavix for a secondary stroke prevention's perspective, as she is intolerant of aspirin.  It is reasonable to have her discontinue Plavix 7 days prior to her procedure and resume when deemed appropriate by her surgeon. She is at an overall LOW risk for cerebrovascular complications".  Patient is aware that her last dose of clopidogrel will be on 12/03/2020.  She denies previous perioperative complications with anesthesia. She underwent a general anesthetic course here (ASA III) in 12/2019 with no documented complications.  Patient with history of lumbar stenosis and subsequent fusion.  Results from most recent imaging included below for review by the anesthesia team in the event that neuraxial anesthetic course is considered as part of this patient's plan of care.  Vitals with BMI 12/04/2020 12/04/2020 11/29/2020  Height 5\' 1"  5\' 1"  5' 1.5"  Weight 173 lbs 10 oz 183 lbs 183 lbs  BMI 32.82 43.3 29.51  Systolic 884 166 -  Diastolic 70 66 -  Pulse 96 96 -  Some encounter information is confidential and restricted. Go to Review Flowsheets activity to see all data.    Providers/Specialists:   NOTE: Primary physician provider listed below. Patient may have been seen by APP or partner within same practice.   PROVIDER ROLE /  SPECIALTY LAST Dola Factor, MD NEUROSURGERY 07/26/2020  [facility]-Scocuzza, Nino Glow, MD PRIMARY CARE PROVIDER 09/03/2020  Povsic, Lattie Haw, PA-C CARDIOLOGY 04/04/2020  Vernard Gambles, MD PULMONARY MEDICINE 09/06/2020  Jennings Books, MD NEUROLOGY 08/12/2020  Murlean Iba, MD NEPHROLOGY 09/26/2020   Allergies:  Ceftin [cefuroxime axetil]; Lisinopril; Sulfa antibiotics; Sulfasalazine; Morphine; Xarelto [rivaroxaban]; Adhesive [tape]; Antihistamines, chlorpheniramine-type; Antivert [meclizine hcl]; Aspirin; Contrast media [iodinated diagnostic agents]; Decongestant [pseudoephedrine hcl]; Doxycycline; Levaquin [levofloxacin in d5w]; Polymyxin b; and Tetanus toxoids  Current Home Medications:   No current facility-administered medications for this encounter.    Adalimumab 40 MG/0.4ML PNKT   albuterol (VENTOLIN HFA) 108 (90 Base) MCG/ACT inhaler   ALPRAZolam (XANAX) 0.25 MG tablet   amLODipine (NORVASC) 2.5 MG tablet   benzonatate (TESSALON) 200 MG capsule   budesonide (PULMICORT) 0.25 MG/2ML nebulizer solution   cyclobenzaprine (FLEXERIL) 10 MG tablet   dicyclomine (BENTYL) 10 MG capsule   escitalopram (LEXAPRO) 10 MG tablet   famotidine (PEPCID) 20 MG tablet   fluticasone (FLONASE) 50 MCG/ACT nasal spray   gabapentin (NEURONTIN) 300 MG capsule   guaiFENesin (MUCINEX) 600 MG 12 hr tablet   HYDROcodone-acetaminophen (NORCO/VICODIN) 5-325 MG tablet   ipratropium-albuterol (DUONEB) 0.5-2.5 (3) MG/3ML SOLN   lamoTRIgine (LAMICTAL) 100 MG tablet   leflunomide (ARAVA) 20 MG tablet   lidocaine (LIDODERM) 5 %   lovastatin (MEVACOR) 20 MG tablet   mirabegron ER (MYRBETRIQ) 50 MG TB24 tablet   montelukast (SINGULAIR) 10 MG tablet   nebivolol (BYSTOLIC) 10 MG tablet   oxyCODONE-acetaminophen (PERCOCET) 5-325 MG tablet   pantoprazole (PROTONIX) 40 MG tablet   predniSONE (DELTASONE) 5 MG tablet  QUEtiapine (SEROQUEL) 25 MG tablet   sucralfate (CARAFATE) 1 g tablet   zoledronic acid  (RECLAST) 5 MG/100ML SOLN injection   History:   Past Medical History:  Diagnosis Date   Anxiety    Aortic stenosis 07/09/2015   a.) TTE 07/06/2015: EF 55-60%; mild AS with MPG 13.0 mmHg.   Asthma    Bipolar disorder ([place])    C. difficile diarrhea 2010   a.) following ABX course during hospital admission   Carotid atherosclerosis, bilateral    a.) Moderate; < 50% stenosis BILATERAL ICAs.   Cataract    a.) s/p BILATERAL extraction   Chicken pox    CKD (chronic kidney disease), stage III (HCC)    a. s/p R nephrectomy./ aneurysm   Conversion disorder    COPD (chronic obstructive pulmonary disease) (HCC)    Depression    Essential hypertension    GERD (gastroesophageal reflux disease)    Heart murmur    Hyperlipidemia    ILD (interstitial lung disease) (HCC)    mild; 2/2 RA diagnosis   Inflammatory arthritis    a. hands/carpal tunnel.  b. Low titer rheumatoid factor. c. Negative anti-CCP antibodies. d. Plaquenil.   Macular degeneration    Nocturnal hypoxemia    Non-Obstructive CAD    a. 07/2009 Cath ([facility]): nonobs dzs;  b. 03/2011 Cath [facility]): nonobs dzs.   Osteoarthritis    a. Knees.   PAD (peripheral artery disease) (HCC)    PUD (peptic ulcer disease)    S/P right hip fracture    11/01/16 s/p repair   Shoulder pain    Sleep apnea    no cpap / minimal   Spinal stenosis at L4-L5 level    severe with L4/L5 anterolisthesis grade 1 anterolisthesis    Toxic maculopathy    Valvular heart disease    a.) TTE 07/06/2015: EF 55-60%; Mild MR/AR/TR; Mild AS with MPG 13.0 mmHg.   Past Surgical History:  Procedure Laterality Date   APPENDECTOMY     BACK SURGERY     lumbar fusion   BUNIONECTOMY Right    CATARACT EXTRACTION, BILATERAL     CESAREAN SECTION     x1   CHOLECYSTECTOMY N/A 05/11/2016   Procedure: LAPAROSCOPIC CHOLECYSTECTOMY;  Surgeon: Florene Glen, MD;  Location: [facility] ORS;  Service: General;  Laterality: N/A;   COLONOSCOPY WITH PROPOFOL N/A 04/02/2016   Procedure:  COLONOSCOPY WITH PROPOFOL;  Surgeon: Jonathon Bellows, MD;  Location: [facility] ENDOSCOPY;  Service: Endoscopy;  Laterality: N/A;   ENDOSCOPIC RETROGRADE CHOLANGIOPANCREATOGRAPHY (ERCP) WITH PROPOFOL N/A 05/08/2016   Procedure: ENDOSCOPIC RETROGRADE CHOLANGIOPANCREATOGRAPHY (ERCP) WITH PROPOFOL;  Surgeon: Lucilla Lame, MD;  Location: [facility] ENDOSCOPY;  Service: Endoscopy;  Laterality: N/A;   ERCP     with biliary spincterotomy 05/08/16 Dr. Allen Norris for choledocholithiasis    ESOPHAGEAL DILATION  04/02/2016   Procedure: ESOPHAGEAL DILATION;  Surgeon: Jonathon Bellows, MD;  Location: [facility] ENDOSCOPY;  Service: Endoscopy;;   ESOPHAGOGASTRODUODENOSCOPY (EGD) WITH PROPOFOL N/A 04/02/2016   Procedure: ESOPHAGOGASTRODUODENOSCOPY (EGD) WITH PROPOFOL;  Surgeon: Jonathon Bellows, MD;  Location: [facility] ENDOSCOPY;  Service: Endoscopy;  Laterality: N/A;   HIP ARTHROPLASTY Right 11/01/2016   Procedure: ARTHROPLASTY BIPOLAR HIP (HEMIARTHROPLASTY);  Surgeon: Corky Mull, MD;  Location: [facility] ORS;  Service: Orthopedics;  Laterality: Right;   NEPHRECTOMY  1988   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   ORIF SCAPHOID FRACTURE Left 12/21/2019   Procedure: OPEN REDUCTION INTERNAL FIXATION (ORIF) OF LEFT SCAPULAR NONUNION WITH BONE GRAFT;  Surgeon:  Poggi, Marshall Cork, MD;  Location: [facility] ORS;  Service: Orthopedics;  Laterality: Left;   osteoporosis     noted DEXA 08/19/16    REPLACEMENT TOTAL KNEE Right    REVERSE SHOULDER ARTHROPLASTY Right 11/04/2017   Procedure: REVERSE SHOULDER ARTHROPLASTY;  Surgeon: Corky Mull, MD;  Location: [facility] ORS;  Service: Orthopedics;  Laterality: Right;   REVERSE SHOULDER ARTHROPLASTY Left 07/26/2018   Procedure: REVERSE SHOULDER ARTHROPLASTY;  Surgeon: Corky Mull, MD;  Location: [facility] ORS;  Service: Orthopedics;  Laterality: Left;   TONSILLECTOMY     TOTAL HIP ARTHROPLASTY  12/10/11   [facility] left hip   TOTAL HIP ARTHROPLASTY Bilateral    TUBAL LIGATION     Family History  Problem Relation Age of  Onset   Rheum arthritis Mother    Asthma Mother    Parkinson's disease Mother    Heart disease Mother    Stroke Mother    Hypertension Mother    Heart attack Father    Heart disease Father    Hypertension Father    Peripheral Artery Disease Father    Diabetes Son    Gout Son    Asthma Sister    Heart disease Sister    Lung cancer Sister    Heart disease Sister    Heart disease Sister    Breast cancer Sister    Heart attack Sister    Heart disease Brother    Heart disease Maternal Grandmother    Diabetes Maternal Grandmother    Colon cancer Maternal Grandmother    Cancer Maternal Grandmother        Hodgkins lymphoma   Heart disease Brother    Alcohol abuse Brother    Depression Brother    Dementia Son    Social History   Tobacco Use   Smoking status: Former    Packs/day: 0.50    Years: 20.00    Pack years: 10.00    Types: Cigarettes    Quit date: 02/02/1974    Years since quitting: 46.8   Smokeless tobacco: Never  Vaping Use   Vaping Use: Never used  Substance Use Topics   Alcohol use: No   Drug use: No    Pertinent Clinical Results:  LABS: Labs reviewed: Acceptable for surgery.  No visits with results within 3 Day(s) from this visit.  Latest known visit with results is:  Office Visit on 11/22/2020  Component Date Value Ref Range Status   Glucose, Bld 11/22/2020 157 (A)  65 - 99 mg/dL Final   Comment: Fasting reference interval For someone without known diabetes, a glucose value >125 mg/dL indicates that they may have diabetes and this should be confirmed with a follow-up test.   BUN 11/22/2020 17  7 - 25 mg/dL Final   Creat 11/22/2020 1.48 (A)  0.60 - 1.00 mg/dL Final   BUN/Creatinine Ratio 11/22/2020 11  6 - 22 (calc) Final   Sodium 11/22/2020 139  135 - 146 mmol/L Final   Potassium 11/22/2020 4.5  3.5 - 5.3 mmol/L Final   Chloride 11/22/2020 99  98 - 110 mmol/L Final   CO2 11/22/2020 26  20 - 32 mmol/L Final   Calcium 11/22/2020 9.4  8.6 - 10.4 mg/dL  Final   WBC 11/22/2020 10.8  3.8 - 10.8 Thousand/uL Final   RBC 11/22/2020 4.41  3.80 - 5.10 Million/uL Final   Hemoglobin 11/22/2020 12.7  11.7 - 15.5 g/dL Final   HCT 11/22/2020 39.3  35.0 - 45.0 % Final  MCV 11/22/2020 89.1  80.0 - 100.0 fL Final   MCH 11/22/2020 28.8  27.0 - 33.0 pg Final   MCHC 11/22/2020 32.3  32.0 - 36.0 g/dL Final   RDW 11/22/2020 14.1  11.0 - 15.0 % Final   Platelets 11/22/2020 185  140 - 400 Thousand/uL Final   MPV 11/22/2020 11.6  7.5 - 12.5 fL Final   Neutro Abs 11/22/2020 8,154 (A)  1,500 - 7,800 cells/uL Final   Lymphs Abs 11/22/2020 1,652  850 - 3,900 cells/uL Final   Absolute Monocytes 11/22/2020 745  200 - 950 cells/uL Final   Eosinophils Absolute 11/22/2020 205  15 - 500 cells/uL Final   Basophils Absolute 11/22/2020 43  0 - 200 cells/uL Final   Neutrophils Relative % 11/22/2020 75.5  % Final   Total Lymphocyte 11/22/2020 15.3  % Final   Monocytes Relative 11/22/2020 6.9  % Final   Eosinophils Relative 11/22/2020 1.9  % Final   Basophils Relative 11/22/2020 0.4  % Final   Magnesium 11/22/2020 2.1  1.5 - 2.5 mg/dL Final    ECG: Date: 10/29/2020 Time ECG obtained: 1318 PM Rate: 81 bpm Rhythm:  Accelerated junctional rhythm Axis (leads I and aVF): Normal Intervals: QRS 86 ms. QTc 429 ms. ST segment and T wave changes: No evidence of acute ST segment elevation or depression Comparison: Similar to previous tracing obtained on 10/08/2020   IMAGING / PROCEDURES: DIAGNOSTIC RADIOGRAPHS OF CHEST performed on 12/04/2020 New linear opacity is seen in the central left upper lobe, consistent with mild subsegmental atelectasis.  No evidence of pulmonary infiltrate, edema, or pleural effusion. Several old right rib fracture deformities are again seen.  Bilateral shoulder prostheses are seen as well as internal fixation plate and screws in the distal left clavicle and scapula.  PULMONARY FUNCTION TESTING performed on 10/09/2020 PFT Results Latest Ref Rng  & Units 11/25/2015 11/05/2014  FVC-Pre L 2.51 2.62  FVC-Predicted Pre % 87 89  FVC-Post L 2.62 2.63  FVC-Predicted Post % 90 90  Pre FEV1/FVC % % 68 70  Post FEV1/FCV % % 79 80  FEV1-Pre L 1.71 1.82  FEV1-Predicted Pre % 78 82  FEV1-Post L 2.06 2.12  DLCO uncorrected ml/min/mmHg 17.61 16.10  DLCO UNC% % 72 66  DLCO corrected ml/min/mmHg 18.19 -  DLCO COR %Predicted % 74 -  DLVA Predicted % 87 72  TLC L 5.14 -  TLC % Predicted % 101 -  RV % Predicted % 116 -   BILATERAL CAROTID DOPPLER STUDY performed on 05/22/2020 RIGHT CAROTID ARTERY: Moderate heterogeneous plaque at the origin of the right internal carotid artery with velocity parameters indicating less than 50% stenosis. RIGHT VERTEBRAL ARTERY:  Antegrade flow. LEFT CAROTID ARTERY: Moderate heterogeneous plaque at the origin of the internal carotid artery with velocity parameters indicating less than 50% stenosis. LEFT VERTEBRAL ARTERY:  Antegrade flow.  DIAGNOSTIC RADIOGRAPHS OF chest performed on 10/18/2019 The cardiomediastinal silhouette is unchanged in contour.  No pleural effusion, pneumothorax, or acute pleuroparenchymal abnormality. Mild bibasilar atelectasis.  Atherosclerotic calcifications.  Surgical clips project over the upper abdomen.  Remote RIGHT-sided rib fractures.  Bilateral shoulder arthroplasty.  Partial visualization of lumbar spine orthopedic hardware.   TRANSTHORACIC ECHOCARDIOGRAM performed on 10/06/2019 LVEF > 49% Normal LV systolic function Normal RV systolic function Mild AR, MR; trivial TR; no PR No valvular stenosis Aortic sclerosis Limited acoustic windows   CT SHOULDER LET WITHOUT CONTRAST performed on 09/04/2019 Left acromial stress fracture with 5 mm of diastasis at the fracture  site and approximately 7 mm of posterior displacement. Fracture margins are corticated. There are multiple small osseous fragments at the fracture site with prominent soft tissue thickening suggesting  pseudoarthrosis. Status post reverse left shoulder arthroplasty without evidence of periprosthetic lucency or periprosthetic fracture There is a round cutaneous/subcutaneous soft tissue density lesion at the midline of the chest wall just above the level of the manubrium measuring 13 mm in diameter, possibly sebaceous or epidermoid cyst. Correlate with physical exam. Aortic atherosclerosis   CT  LUMBAR SPINE WITHOUT CONTRAST performed on 02/21/2019 Mild L4 superior endplate compression fracture, new from 2018 but of indeterminate acuity. Interval L4-5 fusion as above. Progressive L2-3 disc degeneration with increased bilateral lateral recess stenosis and mild-to-moderate spinal stenosis. Chronically advanced disc degeneration at L5-S1 with left greater than right neural foraminal stenosis. Aortic atherosclerosis  Impression and Plan:  Kerri Carter has been referred for pre-anesthesia review and clearance prior to her undergoing the planned anesthetic and procedural courses. Available labs, pertinent testing, and imaging results were personally reviewed by me. This patient has been appropriately cleared by cardiology (ACCEPTABLE), neurology (LOW), and pulmonary medicine (MODERATE) with the individually indicated risks of significant perioperative complications.  Based on clinical review performed today (12/06/20), barring any significant acute changes in the patient's overall condition, it is anticipated that she will be able to proceed with the planned surgical intervention. Any acute changes in clinical condition may necessitate her procedure being postponed and/or cancelled. Patient will meet with anesthesia team (MD and/or CRNA) on the day of her procedure for preoperative evaluation/assessment. Questions regarding anesthetic course will be fielded at that time.   Pre-surgical instructions were reviewed with the patient during her PAT appointment and questions were fielded by PAT clinical staff.  Patient was advised that if any questions or concerns arise prior to her procedure then she should return a call to PAT and/or her surgeon's office to discuss.  Honor Loh, MSN, APRN, FNP-C, CEN [facility]  Peri-operative Services Nurse Practitioner Phone: [phone] Fax: [phone] 12/06/20 2:34 PM  NOTE: This note has been prepared using Dragon dictation software. Despite my best ability to proofread, there is always the potential that unintentional transcriptional errors may still occur from this process.

## 2020-12-04 NOTE — Telephone Encounter (Signed)
-----   Message from Kerri Jackson, MD sent at 12/02/2020  5:56 AM EDT ----- Creatinine slightly elevated from baseline 1 of her kidney #s  Avoid nsaids and increase water 55-64 ounces daily  Blood cts normal except neutrophils slightly elevated  -any wounds on skin or uti/sick sx's?  Magnesium normal   I think we are going to have to do CT scan knee (left) instead of MRI since having surgery soon still trying to coordinate either will have CT or MRI both see bone

## 2020-12-05 NOTE — Telephone Encounter (Signed)
Spoke with [facility]  and they state they need addended office notes for the supply of the hospital bed and wheel chair.   For the wheelchair, not needs to address that the Patient can not use a cane or walker. Also if the Patient can safely propel herself or has a family member/caregiver that can push her.   For the hospital bed note needs to address that Patient needs to either have the head of the bed at 30 degrees or more or that the Patient needs repositioning that can not be done in a regular bed. They then need the diagnosis stated for why the Patient needs an elevated head ofd the bed or repositioning.

## 2020-12-05 NOTE — Telephone Encounter (Signed)
Updated note needing to be faxed to [phone]

## 2020-12-05 NOTE — Progress Notes (Signed)
Pre-op CXR was fine, no acute findings

## 2020-12-06 ENCOUNTER — Ambulatory Visit: Payer: Medicare PPO | Admitting: Internal Medicine

## 2020-12-06 ENCOUNTER — Telehealth: Payer: Self-pay

## 2020-12-06 NOTE — Telephone Encounter (Signed)
Gave patient information regarding PureWick. Patient states it is not affordable for her at this time.

## 2020-12-06 NOTE — Telephone Encounter (Signed)
-----   Message from Debroah Loop, [place] sent at 12/05/2020  1:36 PM EDT ----- [place] [facility], can you please call the patient and give her the info about PureWick as below? ----- Message ----- From: [facility]-Scocuzza, Nino Glow, MD Sent: 12/05/2020  12:36 PM EDT To: Debroah Loop, PA-C  Can you reach out for this?  ----- Message ----- From: Debroah Loop, PA-C Sent: 12/05/2020  11:25 AM EDT To: [name], MD  Prescriptions aren't required for PureWick devices and unfortunately Medicare doesn't cover them, so they can be rather expensive out of pocket ($400+). I'm perfectly fine with her getting one if it's not cost prohibitive. She can order it online directly from their website. ----- Message ----- From: [facility]-Scocuzza, Nino Glow, MD Sent: 12/04/2020   5:22 PM EDT To: Debroah Loop, PA-C  Pt inquired about pure wick is this something you all could do?  ----- Message ----- From: Debroah Loop, PA-C Sent: 12/04/2020   3:07 PM EDT To: [name], MD

## 2020-12-09 ENCOUNTER — Other Ambulatory Visit: Payer: Medicare PPO

## 2020-12-09 ENCOUNTER — Other Ambulatory Visit: Admission: RE | Admit: 2020-12-09 | Payer: Medicare PPO | Source: Ambulatory Visit

## 2020-12-10 ENCOUNTER — Other Ambulatory Visit
Admission: RE | Admit: 2020-12-10 | Discharge: 2020-12-10 | Disposition: A | Discharge status: Home / Self Care | Payer: Medicare PPO | Source: Ambulatory Visit | Attending: Pulmonary Disease | Admitting: Pulmonary Disease

## 2020-12-10 ENCOUNTER — Other Ambulatory Visit: Payer: Self-pay

## 2020-12-10 ENCOUNTER — Other Ambulatory Visit: Payer: Medicare PPO

## 2020-12-10 DIAGNOSIS — Z20822 Contact with and (suspected) exposure to covid-19: Secondary | ICD-10-CM | POA: Diagnosis not present

## 2020-12-10 DIAGNOSIS — Z01812 Encounter for preprocedural laboratory examination: Secondary | ICD-10-CM | POA: Insufficient documentation

## 2020-12-10 DIAGNOSIS — M48062 Spinal stenosis, lumbar region with neurogenic claudication: Secondary | ICD-10-CM | POA: Diagnosis not present

## 2020-12-10 DIAGNOSIS — M5416 Radiculopathy, lumbar region: Secondary | ICD-10-CM | POA: Diagnosis not present

## 2020-12-10 LAB — TYPE AND SCREEN
ABO/RH(D): AB POS
Antibody Screen: NEGATIVE

## 2020-12-10 LAB — SURGICAL PCR SCREEN
MRSA, PCR: NEGATIVE
Staphylococcus aureus: NEGATIVE

## 2020-12-10 LAB — APTT: aPTT: 26 seconds (ref 24–36)

## 2020-12-10 LAB — PROTIME-INR
INR: 0.9 (ref 0.8–1.2)
Prothrombin Time: 12.6 seconds (ref 11.4–15.2)

## 2020-12-10 LAB — SARS CORONAVIRUS 2 (TAT 6-24 HRS): SARS Coronavirus 2: NEGATIVE

## 2020-12-10 MED ORDER — CHLORHEXIDINE GLUCONATE 0.12 % MT SOLN: 15.0000 mL | Freq: Once | OROMUCOSAL | Status: AC

## 2020-12-10 MED ORDER — ORAL CARE MOUTH RINSE: 15.0000 mL | Freq: Once | OROMUCOSAL | Status: AC

## 2020-12-10 MED ORDER — SODIUM CHLORIDE 0.9 % IV SOLN: INTRAVENOUS | Status: DC

## 2020-12-10 NOTE — Progress Notes (Signed)
  Perioperative Services Pre-Admission/Anesthesia Testing    Date: 12/10/20  Name: Kerri Carter MRN:   [id number]  Re: Preoperative lab testing  Planned Surgical Procedure(s):    Case: [id number] Date/Time: 12/11/20 0934   Procedure: LEFT L2-3 MICRODISCECTOMY, L3-4 AND L5-S1 DECOMPRESSION   Anesthesia type: General   Pre-op diagnosis:      Spinal stenosis of lumbar region with neurogenic claudication M48.062     Lumbar radiculopathy M54.16   Location: [facility] OR ROOM 03 / [place]   Surgeons: Meade Maw, MD   Clinical Notes:  Patient scheduled for the above procedure tomorrow (12/11/2020) with Dr. Meade Maw, MD.  In preparation for her procedure, patient with orders for labs, MRSA, and SARS-CoV-2 testing.  Patient initially scheduled to present to clinic on 12/09/2020, however patient did not come in for her appointment.  Patient was contacted on the afternoon of 12/09/2020 to inquire as to plans regarding upcoming procedure and prerequisite testing.  Patient advising that she had no transportation to the hospital citing that medical transportation "did not have her on the list like they were supposed to" and that her family was working and could not bring her.  Discussed alternative options with patient in order to obtain testing, including PAT APP presenting to her home on the morning of 12/10/2020.  Patient agreed with the latter option.  Physical address confirmed with patient; appointment scheduled.  PAT APP presented to the patient's home morning of 12/10/2020; identity of patient confirmed. Patient observed ion a seated position in her rollator walker in her pajamas. Respirations even and non-labored. Patient CAO x 4. Discussed need for preoperative testing, and verbal consent obtained.  Blood samples obtained from patient's LEFT antecubital area using a 23g butterfly needle x1 attempt. Nasal swab was obtained for both MRSA and SARS-CoV-2.  Patient  tolerated all procedures well. I advised her that I would contact her personally with any abnormal results from the labs collected today. Reviewed pre and presurgical instructions, including use of CHG wipes and medications to take on the morning of surgery. Patient advised to contact hospital at the number provided later today (around 1400) to obtain her arrival time for her procedure. Patient verbalized understanding of all provided instructions.   APP assisted patient with making a cup of coffee prior to leaving the home. Patient very appreciative of time and efforts made to ensure that her procedure will be able to proceed tomorrow as planned. Reassurance provided regarding procedure. Patient advised that she had made an excellent choice in providers, as Dr. [name] is a very conscientious and Water engineer. Patient verbalized that she was ready to get the procedure behind her so that her back and legs would stop hurting. She was left with well wishes for her procedure tomorrow. Time in home approximately 0800 to 0830 AM.   Kerri Loh, MSN, APRN, FNP-C, [place]  Peri-operative Services Nurse Practitioner Phone: [phone] 12/10/20 10:13 AM  NOTE: This note has been prepared using Dragon dictation software. Despite my best ability to proofread, there is always the potential that unintentional transcriptional errors may still occur from this process.

## 2020-12-11 ENCOUNTER — Encounter
Admission: RE | Disposition: A | Discharge status: Skilled Nursing Facility | Payer: Self-pay | Source: Ambulatory Visit | Attending: Neurosurgery

## 2020-12-11 ENCOUNTER — Encounter: Payer: Self-pay | Admitting: Neurosurgery

## 2020-12-11 ENCOUNTER — Ambulatory Visit: Payer: Medicare PPO | Admitting: Urgent Care

## 2020-12-11 ENCOUNTER — Ambulatory Visit: Payer: Medicare PPO

## 2020-12-11 ENCOUNTER — Other Ambulatory Visit: Payer: Self-pay

## 2020-12-11 ENCOUNTER — Observation Stay
Admission: RE | Admit: 2020-12-11 | Discharge: 2020-12-13 | Disposition: A | Discharge status: Skilled Nursing Facility | Payer: Medicare PPO | Source: Ambulatory Visit | Attending: Neurosurgery | Admitting: Neurosurgery

## 2020-12-11 ENCOUNTER — Ambulatory Visit: Payer: Self-pay | Admitting: Urology

## 2020-12-11 DIAGNOSIS — Z87891 Personal history of nicotine dependence: Secondary | ICD-10-CM | POA: Insufficient documentation

## 2020-12-11 DIAGNOSIS — Z96651 Presence of right artificial knee joint: Secondary | ICD-10-CM | POA: Insufficient documentation

## 2020-12-11 DIAGNOSIS — Z981 Arthrodesis status: Secondary | ICD-10-CM | POA: Diagnosis not present

## 2020-12-11 DIAGNOSIS — M5416 Radiculopathy, lumbar region: Secondary | ICD-10-CM | POA: Diagnosis present

## 2020-12-11 DIAGNOSIS — Z419 Encounter for procedure for purposes other than remedying health state, unspecified: Secondary | ICD-10-CM

## 2020-12-11 DIAGNOSIS — J45909 Unspecified asthma, uncomplicated: Secondary | ICD-10-CM | POA: Diagnosis not present

## 2020-12-11 DIAGNOSIS — M5116 Intervertebral disc disorders with radiculopathy, lumbar region: Secondary | ICD-10-CM | POA: Diagnosis not present

## 2020-12-11 DIAGNOSIS — Z96643 Presence of artificial hip joint, bilateral: Secondary | ICD-10-CM | POA: Insufficient documentation

## 2020-12-11 DIAGNOSIS — Z79899 Other long term (current) drug therapy: Secondary | ICD-10-CM | POA: Diagnosis not present

## 2020-12-11 DIAGNOSIS — Z96611 Presence of right artificial shoulder joint: Secondary | ICD-10-CM | POA: Insufficient documentation

## 2020-12-11 DIAGNOSIS — I509 Heart failure, unspecified: Secondary | ICD-10-CM | POA: Diagnosis not present

## 2020-12-11 DIAGNOSIS — E119 Type 2 diabetes mellitus without complications: Secondary | ICD-10-CM | POA: Diagnosis not present

## 2020-12-11 DIAGNOSIS — G4733 Obstructive sleep apnea (adult) (pediatric): Secondary | ICD-10-CM | POA: Diagnosis not present

## 2020-12-11 DIAGNOSIS — J449 Chronic obstructive pulmonary disease, unspecified: Secondary | ICD-10-CM | POA: Insufficient documentation

## 2020-12-11 DIAGNOSIS — E039 Hypothyroidism, unspecified: Secondary | ICD-10-CM | POA: Diagnosis not present

## 2020-12-11 DIAGNOSIS — Z96612 Presence of left artificial shoulder joint: Secondary | ICD-10-CM | POA: Insufficient documentation

## 2020-12-11 DIAGNOSIS — M4326 Fusion of spine, lumbar region: Secondary | ICD-10-CM | POA: Diagnosis not present

## 2020-12-11 DIAGNOSIS — F419 Anxiety disorder, unspecified: Secondary | ICD-10-CM

## 2020-12-11 DIAGNOSIS — M48062 Spinal stenosis, lumbar region with neurogenic claudication: Secondary | ICD-10-CM | POA: Insufficient documentation

## 2020-12-11 DIAGNOSIS — I110 Hypertensive heart disease with heart failure: Secondary | ICD-10-CM | POA: Insufficient documentation

## 2020-12-11 DIAGNOSIS — I2510 Atherosclerotic heart disease of native coronary artery without angina pectoris: Secondary | ICD-10-CM | POA: Diagnosis not present

## 2020-12-11 DIAGNOSIS — G4700 Insomnia, unspecified: Secondary | ICD-10-CM

## 2020-12-11 HISTORY — PX: LUMBAR LAMINECTOMY/DECOMPRESSION MICRODISCECTOMY: SHX5026

## 2020-12-11 HISTORY — DX: Occlusion and stenosis of bilateral carotid arteries: I65.23

## 2020-12-11 LAB — URINALYSIS, ROUTINE W REFLEX MICROSCOPIC
Bilirubin Urine: NEGATIVE
Glucose, UA: NEGATIVE mg/dL
Hgb urine dipstick: NEGATIVE
Ketones, ur: NEGATIVE mg/dL
Leukocytes,Ua: NEGATIVE
Nitrite: NEGATIVE
Protein, ur: NEGATIVE mg/dL
Specific Gravity, Urine: 1.012 (ref 1.005–1.030)
pH: 6 (ref 5.0–8.0)

## 2020-12-11 SURGERY — LUMBAR LAMINECTOMY/DECOMPRESSION MICRODISCECTOMY 3 LEVELS
Anesthesia: General

## 2020-12-11 MED ORDER — PROMETHAZINE HCL 25 MG/ML IJ SOLN: 6.2500 mg | INTRAMUSCULAR | Status: DC | PRN

## 2020-12-11 MED ORDER — IPRATROPIUM-ALBUTEROL 0.5-2.5 (3) MG/3ML IN SOLN
RESPIRATORY_TRACT | Status: AC
  Administered 2020-12-11: 3 mL via RESPIRATORY_TRACT
  Filled 2020-12-11: qty 3

## 2020-12-11 MED ORDER — DEXAMETHASONE SODIUM PHOSPHATE 10 MG/ML IJ SOLN
INTRAMUSCULAR | Status: DC | PRN
  Administered 2020-12-11: 10 mg via INTRAVENOUS

## 2020-12-11 MED ORDER — VASOPRESSIN 20 UNIT/ML IV SOLN
INTRAVENOUS | Status: DC | PRN
  Administered 2020-12-11: .5 [IU] via INTRAVENOUS

## 2020-12-11 MED ORDER — LACTATED RINGERS IV SOLN: INTRAVENOUS | Status: DC | PRN

## 2020-12-11 MED ORDER — SUCCINYLCHOLINE CHLORIDE 200 MG/10ML IV SOSY
PREFILLED_SYRINGE | INTRAVENOUS | Status: DC | PRN
  Administered 2020-12-11: 100 mg via INTRAVENOUS

## 2020-12-11 MED ORDER — REMIFENTANIL HCL 1 MG IV SOLR
INTRAVENOUS | Status: AC
  Filled 2020-12-11: qty 1000

## 2020-12-11 MED ORDER — VASOPRESSIN 20 UNIT/ML IV SOLN
INTRAVENOUS | Status: AC
  Filled 2020-12-11: qty 1

## 2020-12-11 MED ORDER — ALBUTEROL SULFATE HFA 108 (90 BASE) MCG/ACT IN AERS: 2.0000 | INHALATION_SPRAY | Freq: Four times a day (QID) | RESPIRATORY_TRACT | Status: DC | PRN

## 2020-12-11 MED ORDER — ONDANSETRON HCL 4 MG PO TABS: 4.0000 mg | ORAL_TABLET | Freq: Four times a day (QID) | ORAL | Status: DC | PRN

## 2020-12-11 MED ORDER — BISACODYL 10 MG RE SUPP
10.0000 mg | Freq: Every day | RECTAL | Status: DC | PRN
  Filled 2020-12-11: qty 1

## 2020-12-11 MED ORDER — SODIUM CHLORIDE 0.9 % IV SOLN
INTRAVENOUS | Status: DC | PRN
  Administered 2020-12-11: 40 mL

## 2020-12-11 MED ORDER — PROPOFOL 10 MG/ML IV BOLUS
INTRAVENOUS | Status: DC | PRN
  Administered 2020-12-11: 100 mg via INTRAVENOUS

## 2020-12-11 MED ORDER — SODIUM CHLORIDE 0.9% FLUSH
3.0000 mL | Freq: Two times a day (BID) | INTRAVENOUS | Status: DC
  Administered 2020-12-11 – 2020-12-12 (×3): 3 mL via INTRAVENOUS

## 2020-12-11 MED ORDER — MENTHOL 3 MG MT LOZG
1.0000 | LOZENGE | OROMUCOSAL | Status: DC | PRN
  Filled 2020-12-11: qty 9

## 2020-12-11 MED ORDER — IPRATROPIUM-ALBUTEROL 0.5-2.5 (3) MG/3ML IN SOLN
3.0000 mL | Freq: Two times a day (BID) | RESPIRATORY_TRACT | Status: DC
  Filled 2020-12-11: qty 3

## 2020-12-11 MED ORDER — 0.9 % SODIUM CHLORIDE (POUR BTL) OPTIME
TOPICAL | Status: DC | PRN
  Administered 2020-12-11: 1000 mL

## 2020-12-11 MED ORDER — FLUTICASONE PROPIONATE 50 MCG/ACT NA SUSP
2.0000 | Freq: Every day | NASAL | Status: DC
  Administered 2020-12-12: 2 via NASAL
  Filled 2020-12-11 (×2): qty 16

## 2020-12-11 MED ORDER — BUDESONIDE 0.25 MG/2ML IN SUSP
0.2500 mg | Freq: Two times a day (BID) | RESPIRATORY_TRACT | Status: DC
  Administered 2020-12-12: 0.25 mg via RESPIRATORY_TRACT
  Filled 2020-12-11 (×6): qty 2

## 2020-12-11 MED ORDER — VANCOMYCIN HCL 1250 MG/250ML IV SOLN
1250.0000 mg | INTRAVENOUS | Status: AC
  Administered 2020-12-11: 1250 mg via INTRAVENOUS
  Filled 2020-12-11: qty 250

## 2020-12-11 MED ORDER — NEBIVOLOL HCL 5 MG PO TABS
5.0000 mg | ORAL_TABLET | Freq: Two times a day (BID) | ORAL | Status: DC
  Administered 2020-12-11 – 2020-12-13 (×4): 5 mg via ORAL
  Filled 2020-12-11 (×5): qty 1

## 2020-12-11 MED ORDER — MONTELUKAST SODIUM 10 MG PO TABS
10.0000 mg | ORAL_TABLET | Freq: Every day | ORAL | Status: DC
  Administered 2020-12-12 – 2020-12-13 (×2): 10 mg via ORAL
  Filled 2020-12-11 (×2): qty 1

## 2020-12-11 MED ORDER — SODIUM CHLORIDE FLUSH 0.9 % IV SOLN
INTRAVENOUS | Status: AC
  Administered 2020-12-11: 3 mL via INTRAVENOUS
  Filled 2020-12-11: qty 10

## 2020-12-11 MED ORDER — OXYCODONE HCL 5 MG/5ML PO SOLN: 5.0000 mg | Freq: Once | ORAL | Status: DC | PRN

## 2020-12-11 MED ORDER — GABAPENTIN 300 MG PO CAPS
ORAL_CAPSULE | ORAL | Status: AC
  Administered 2020-12-11: 600 mg via ORAL
  Filled 2020-12-11: qty 2

## 2020-12-11 MED ORDER — ALPRAZOLAM 0.25 MG PO TABS: 0.2500 mg | ORAL_TABLET | Freq: Every evening | ORAL | Status: DC | PRN

## 2020-12-11 MED ORDER — SODIUM CHLORIDE 0.9 % IV SOLN: INTRAVENOUS | Status: DC

## 2020-12-11 MED ORDER — SUCCINYLCHOLINE CHLORIDE 200 MG/10ML IV SOSY
PREFILLED_SYRINGE | INTRAVENOUS | Status: AC
  Filled 2020-12-11: qty 10

## 2020-12-11 MED ORDER — CHLORHEXIDINE GLUCONATE 0.12 % MT SOLN
OROMUCOSAL | Status: AC
  Administered 2020-12-11: 15 mL via OROMUCOSAL
  Filled 2020-12-11: qty 15

## 2020-12-11 MED ORDER — LABETALOL HCL 5 MG/ML IV SOLN
INTRAVENOUS | Status: AC
  Administered 2020-12-11: 10 mg via INTRAVENOUS
  Filled 2020-12-11: qty 4

## 2020-12-11 MED ORDER — DICYCLOMINE HCL 10 MG PO CAPS
10.0000 mg | ORAL_CAPSULE | Freq: Two times a day (BID) | ORAL | Status: DC | PRN
  Filled 2020-12-11: qty 1

## 2020-12-11 MED ORDER — ONDANSETRON HCL 4 MG/2ML IJ SOLN
INTRAMUSCULAR | Status: DC | PRN
  Administered 2020-12-11: 4 mg via INTRAVENOUS

## 2020-12-11 MED ORDER — SODIUM CHLORIDE 0.9 % IV SOLN
250.0000 mL | INTRAVENOUS | Status: DC
  Administered 2020-12-11: 250 mL via INTRAVENOUS

## 2020-12-11 MED ORDER — SUCRALFATE 1 G PO TABS
1.0000 g | ORAL_TABLET | Freq: Two times a day (BID) | ORAL | Status: DC
  Administered 2020-12-12 – 2020-12-13 (×3): 1 g via ORAL
  Filled 2020-12-11 (×5): qty 1

## 2020-12-11 MED ORDER — FLEET ENEMA 7-19 GM/118ML RE ENEM: 1.0000 | ENEMA | Freq: Once | RECTAL | Status: DC | PRN

## 2020-12-11 MED ORDER — SODIUM CHLORIDE 0.9% FLUSH: 3.0000 mL | INTRAVENOUS | Status: DC | PRN

## 2020-12-11 MED ORDER — PHENOL 1.4 % MT LIQD
1.0000 | OROMUCOSAL | Status: DC | PRN
  Filled 2020-12-11: qty 177

## 2020-12-11 MED ORDER — NOREPINEPHRINE 4 MG/250ML-% IV SOLN
INTRAVENOUS | Status: DC | PRN
  Administered 2020-12-11: 3 ug/min via INTRAVENOUS

## 2020-12-11 MED ORDER — HYDROMORPHONE HCL 1 MG/ML IJ SOLN: 0.5000 mg | INTRAMUSCULAR | Status: DC | PRN

## 2020-12-11 MED ORDER — LIDOCAINE HCL (PF) 2 % IJ SOLN
INTRAMUSCULAR | Status: AC
  Filled 2020-12-11: qty 5

## 2020-12-11 MED ORDER — PRAVASTATIN SODIUM 20 MG PO TABS
20.0000 mg | ORAL_TABLET | Freq: Every day | ORAL | Status: DC
  Administered 2020-12-11 – 2020-12-12 (×2): 20 mg via ORAL
  Filled 2020-12-11 (×3): qty 1

## 2020-12-11 MED ORDER — HEMOSTATIC AGENTS (NO CHARGE) OPTIME
TOPICAL | Status: DC | PRN
  Administered 2020-12-11: 1 via TOPICAL

## 2020-12-11 MED ORDER — ALBUTEROL SULFATE (2.5 MG/3ML) 0.083% IN NEBU
2.5000 mg | INHALATION_SOLUTION | Freq: Four times a day (QID) | RESPIRATORY_TRACT | Status: DC | PRN
  Filled 2020-12-11: qty 3

## 2020-12-11 MED ORDER — ACETAMINOPHEN 500 MG PO TABS
ORAL_TABLET | ORAL | Status: AC
  Administered 2020-12-11: 1000 mg via ORAL
  Filled 2020-12-11: qty 2

## 2020-12-11 MED ORDER — OXYCODONE HCL 5 MG PO TABS: 5.0000 mg | ORAL_TABLET | Freq: Once | ORAL | Status: DC | PRN

## 2020-12-11 MED ORDER — ESCITALOPRAM OXALATE 10 MG PO TABS
10.0000 mg | ORAL_TABLET | Freq: Every day | ORAL | Status: DC
  Administered 2020-12-12 – 2020-12-13 (×2): 10 mg via ORAL
  Filled 2020-12-11 (×2): qty 1

## 2020-12-11 MED ORDER — QUETIAPINE FUMARATE 25 MG PO TABS
25.0000 mg | ORAL_TABLET | Freq: Every day | ORAL | Status: DC
  Administered 2020-12-11 – 2020-12-12 (×2): 25 mg via ORAL
  Filled 2020-12-11 (×3): qty 1

## 2020-12-11 MED ORDER — MIRABEGRON ER 50 MG PO TB24
50.0000 mg | ORAL_TABLET | Freq: Every day | ORAL | Status: DC
  Administered 2020-12-12 – 2020-12-13 (×2): 50 mg via ORAL
  Filled 2020-12-11 (×2): qty 1

## 2020-12-11 MED ORDER — PROPOFOL 10 MG/ML IV BOLUS
INTRAVENOUS | Status: AC
  Filled 2020-12-11: qty 20

## 2020-12-11 MED ORDER — METHYLPREDNISOLONE ACETATE 40 MG/ML IJ SUSP
INTRAMUSCULAR | Status: DC | PRN
  Administered 2020-12-11: 40 mg

## 2020-12-11 MED ORDER — BUPIVACAINE HCL (PF) 0.5 % IJ SOLN
INTRAMUSCULAR | Status: DC | PRN
  Administered 2020-12-11: 20 mL

## 2020-12-11 MED ORDER — ACETAMINOPHEN 500 MG PO TABS: 1000.0000 mg | ORAL_TABLET | Freq: Four times a day (QID) | ORAL | Status: AC

## 2020-12-11 MED ORDER — FAMOTIDINE 20 MG PO TABS
20.0000 mg | ORAL_TABLET | Freq: Every day | ORAL | Status: DC
  Administered 2020-12-12 – 2020-12-13 (×2): 20 mg via ORAL
  Filled 2020-12-11: qty 1

## 2020-12-11 MED ORDER — DOCUSATE SODIUM 100 MG PO CAPS
100.0000 mg | ORAL_CAPSULE | Freq: Two times a day (BID) | ORAL | Status: DC
  Administered 2020-12-12: 100 mg via ORAL
  Filled 2020-12-11 (×5): qty 1

## 2020-12-11 MED ORDER — GABAPENTIN 300 MG PO CAPS
600.0000 mg | ORAL_CAPSULE | Freq: Two times a day (BID) | ORAL | Status: DC
  Administered 2020-12-12 – 2020-12-13 (×3): 600 mg via ORAL
  Filled 2020-12-11 (×2): qty 2

## 2020-12-11 MED ORDER — SODIUM CHLORIDE (PF) 0.9 % IJ SOLN
INTRAMUSCULAR | Status: AC
  Filled 2020-12-11: qty 20

## 2020-12-11 MED ORDER — FENTANYL CITRATE (PF) 100 MCG/2ML IJ SOLN
INTRAMUSCULAR | Status: AC
  Filled 2020-12-11: qty 2

## 2020-12-11 MED ORDER — BUPIVACAINE-EPINEPHRINE (PF) 0.5% -1:200000 IJ SOLN
INTRAMUSCULAR | Status: DC | PRN
  Administered 2020-12-11: 77 mL

## 2020-12-11 MED ORDER — CYCLOBENZAPRINE HCL 10 MG PO TABS
10.0000 mg | ORAL_TABLET | Freq: Three times a day (TID) | ORAL | Status: DC | PRN
  Filled 2020-12-11: qty 1

## 2020-12-11 MED ORDER — SENNOSIDES-DOCUSATE SODIUM 8.6-50 MG PO TABS
1.0000 | ORAL_TABLET | Freq: Every evening | ORAL | Status: DC | PRN
  Filled 2020-12-11: qty 1

## 2020-12-11 MED ORDER — OXYCODONE HCL 5 MG PO TABS
10.0000 mg | ORAL_TABLET | ORAL | Status: DC | PRN
  Administered 2020-12-12 – 2020-12-13 (×2): 10 mg via ORAL
  Filled 2020-12-11 (×2): qty 2

## 2020-12-11 MED ORDER — FENTANYL CITRATE (PF) 100 MCG/2ML IJ SOLN
INTRAMUSCULAR | Status: DC | PRN
  Administered 2020-12-11 (×3): 50 ug via INTRAVENOUS

## 2020-12-11 MED ORDER — LEFLUNOMIDE 20 MG PO TABS
20.0000 mg | ORAL_TABLET | Freq: Every day | ORAL | Status: DC
  Administered 2020-12-12 – 2020-12-13 (×2): 20 mg via ORAL
  Filled 2020-12-11 (×2): qty 1

## 2020-12-11 MED ORDER — NOREPINEPHRINE 4 MG/250ML-% IV SOLN
INTRAVENOUS | Status: AC
  Filled 2020-12-11: qty 250

## 2020-12-11 MED ORDER — FENTANYL CITRATE (PF) 100 MCG/2ML IJ SOLN: 25.0000 ug | INTRAMUSCULAR | Status: DC | PRN

## 2020-12-11 MED ORDER — ACETAMINOPHEN 10 MG/ML IV SOLN: 1000.0000 mg | Freq: Once | INTRAVENOUS | Status: DC | PRN

## 2020-12-11 MED ORDER — ONDANSETRON HCL 4 MG/2ML IJ SOLN: 4.0000 mg | Freq: Four times a day (QID) | INTRAMUSCULAR | Status: DC | PRN

## 2020-12-11 MED ORDER — LAMOTRIGINE 25 MG PO TABS
100.0000 mg | ORAL_TABLET | Freq: Two times a day (BID) | ORAL | Status: DC
  Administered 2020-12-11 – 2020-12-13 (×4): 100 mg via ORAL
  Filled 2020-12-11: qty 4
  Filled 2020-12-11 (×3): qty 1

## 2020-12-11 MED ORDER — PANTOPRAZOLE SODIUM 40 MG PO TBEC
40.0000 mg | DELAYED_RELEASE_TABLET | Freq: Two times a day (BID) | ORAL | Status: DC
  Administered 2020-12-11 – 2020-12-13 (×4): 40 mg via ORAL
  Filled 2020-12-11 (×5): qty 1

## 2020-12-11 MED ORDER — REMIFENTANIL HCL 1 MG IV SOLR
INTRAVENOUS | Status: DC | PRN
  Administered 2020-12-11: .1 ug/kg/min via INTRAVENOUS

## 2020-12-11 MED ORDER — LABETALOL HCL 5 MG/ML IV SOLN
10.0000 mg | INTRAVENOUS | Status: DC | PRN
  Administered 2020-12-11: 10 mg via INTRAVENOUS

## 2020-12-11 MED ORDER — OXYCODONE HCL 5 MG PO TABS
5.0000 mg | ORAL_TABLET | ORAL | Status: DC | PRN
  Administered 2020-12-12 – 2020-12-13 (×2): 5 mg via ORAL
  Filled 2020-12-11: qty 1

## 2020-12-11 MED ORDER — EPHEDRINE SULFATE 50 MG/ML IJ SOLN
INTRAMUSCULAR | Status: DC | PRN
  Administered 2020-12-11: 10 mg via INTRAVENOUS

## 2020-12-11 MED ORDER — AMLODIPINE BESYLATE 5 MG PO TABS
2.5000 mg | ORAL_TABLET | Freq: Every day | ORAL | Status: DC
  Administered 2020-12-12 – 2020-12-13 (×2): 2.5 mg via ORAL
  Filled 2020-12-11: qty 1
  Filled 2020-12-11: qty 0.5

## 2020-12-11 MED ORDER — PHENYLEPHRINE HCL (PRESSORS) 10 MG/ML IV SOLN
INTRAVENOUS | Status: DC | PRN
  Administered 2020-12-11: 100 ug via INTRAVENOUS
  Administered 2020-12-11 (×2): 200 ug via INTRAVENOUS
  Administered 2020-12-11: 100 ug via INTRAVENOUS
  Administered 2020-12-11: 50 ug via INTRAVENOUS

## 2020-12-11 SURGICAL SUPPLY — 59 items
ADH SKN CLS APL DERMABOND .7 (GAUZE/BANDAGES/DRESSINGS) ×1
AGENT HMST KT MTR STRL THRMB (HEMOSTASIS) ×1
APL PRP STRL LF DISP 70% ISPRP (MISCELLANEOUS) ×2
BUR NEURO DRILL SOFT 3.0X3.8M (BURR) ×2 IMPLANT
CHLORAPREP W/TINT 26 (MISCELLANEOUS) ×4 IMPLANT
CNTNR SPEC 2.5X3XGRAD LEK (MISCELLANEOUS) ×1
CONT SPEC 4OZ STER OR WHT (MISCELLANEOUS) ×1
CONT SPEC 4OZ STRL OR WHT (MISCELLANEOUS) ×1
CONTAINER SPEC 2.5X3XGRAD LEK (MISCELLANEOUS) ×1 IMPLANT
COUNTER NEEDLE 20/40 LG (NEEDLE) ×2 IMPLANT
CUP MEDICINE 2OZ PLAST GRAD ST (MISCELLANEOUS) ×4 IMPLANT
DERMABOND ADVANCED (GAUZE/BANDAGES/DRESSINGS) ×1
DERMABOND ADVANCED .7 DNX12 (GAUZE/BANDAGES/DRESSINGS) ×1 IMPLANT
DRAPE C ARM PK CFD 31 SPINE (DRAPES) ×2 IMPLANT
DRAPE C-ARM 42X72 X-RAY (DRAPES) ×2 IMPLANT
DRAPE LAPAROTOMY 100X77 ABD (DRAPES) ×2 IMPLANT
DRAPE MICROSCOPE SPINE 48X150 (DRAPES) ×2 IMPLANT
DRAPE SURG 17X11 SM STRL (DRAPES) ×8 IMPLANT
ELECT CAUTERY BLADE TIP 2.5 (TIP) ×2
ELECT EZSTD 165MM 6.5IN (MISCELLANEOUS) ×2
ELECTRODE CAUTERY BLDE TIP 2.5 (TIP) ×1 IMPLANT
ELECTRODE EZSTD 165MM 6.5IN (MISCELLANEOUS) ×1 IMPLANT
GAUZE 4X4 16PLY ~~LOC~~+RFID DBL (SPONGE) ×2 IMPLANT
GLOVE SURG SYN 6.5 ES PF (GLOVE) ×2 IMPLANT
GLOVE SURG SYN 8.5  E (GLOVE) ×3
GLOVE SURG SYN 8.5 E (GLOVE) ×3 IMPLANT
GLOVE SURG UNDER POLY LF SZ6.5 (GLOVE) ×2 IMPLANT
GOWN SRG LRG LVL 4 IMPRV REINF (GOWNS) ×1 IMPLANT
GOWN SRG XL LVL 3 NONREINFORCE (GOWNS) ×1 IMPLANT
GOWN STRL NON-REIN TWL XL LVL3 (GOWNS) ×2
GOWN STRL REIN LRG LVL4 (GOWNS) ×2
GOWN STRL REUS W/ TWL XL LVL3 (GOWN DISPOSABLE) ×1 IMPLANT
GOWN STRL REUS W/TWL XL LVL3 (GOWN DISPOSABLE) ×2
GRADUATE 1200CC STRL 31836 (MISCELLANEOUS) ×2 IMPLANT
HOLDER FOLEY CATH W/STRAP (MISCELLANEOUS) ×2 IMPLANT
KIT SPINAL PRONEVIEW (KITS) ×2 IMPLANT
MANIFOLD NEPTUNE II (INSTRUMENTS) ×2 IMPLANT
MARKER SKIN DUAL TIP RULER LAB (MISCELLANEOUS) ×2 IMPLANT
NDL SAFETY ECLIPSE 18X1.5 (NEEDLE) IMPLANT
NEEDLE HYPO 18GX1.5 SHARP (NEEDLE)
NEEDLE HYPO 22GX1.5 SAFETY (NEEDLE) ×2 IMPLANT
NS IRRIG 1000ML POUR BTL (IV SOLUTION) ×2 IMPLANT
PACK LAMINECTOMY NEURO (CUSTOM PROCEDURE TRAY) ×2 IMPLANT
PAD ARMBOARD 7.5X6 YLW CONV (MISCELLANEOUS) ×2 IMPLANT
PATTIES SURGICAL 1X1 (DISPOSABLE) ×2 IMPLANT
SURGIFLO W/THROMBIN 8M KIT (HEMOSTASIS) ×2 IMPLANT
SUT DVC VLOC 3-0 CL 6 P-12 (SUTURE) ×4 IMPLANT
SUT VIC AB 0 CT1 27 (SUTURE) ×2
SUT VIC AB 0 CT1 27XCR 8 STRN (SUTURE) ×1 IMPLANT
SUT VIC AB 2-0 CT1 18 (SUTURE) ×2 IMPLANT
SYR 20ML LL LF (SYRINGE) ×2 IMPLANT
SYR 30ML LL (SYRINGE) ×4 IMPLANT
SYR 3ML LL SCALE MARK (SYRINGE) ×2 IMPLANT
TOWEL OR 17X26 4PK STRL BLUE (TOWEL DISPOSABLE) ×6 IMPLANT
TRAY FOLEY SLVR 16FR LF STAT (SET/KITS/TRAYS/PACK) ×2 IMPLANT
TUBE MATRX SPINL 18MM 6CM DISP (INSTRUMENTS) ×2
TUBE METRX SPINAL 18X6 DISP (INSTRUMENTS) ×1 IMPLANT
TUBING CONNECTING 10 (TUBING) ×2 IMPLANT
WATER STERILE IRR 500ML POUR (IV SOLUTION) ×2 IMPLANT

## 2020-12-11 NOTE — Discharge Summary (Addendum)
Physician Discharge Summary  Patient ID: Kerri Carter MRN: [id number] DOB/AGE: 78-Nov-1942 78 y.o.  Admit date: 12/11/2020 Discharge date: 12/13/2020  Admission Diagnoses: lumbar radiculopathy  Discharge Diagnoses:  Active Problems:   Lumbar radiculopathy   Discharged Condition: good  Hospital Course:  Kerri Carter is a 80 y.o presenting with back and left sided lumbar radiculopathy who underwent a left L2-3 microdiscectomy, L3-4 and L5-S1 decompression. Her interoperative course was uncomplicated and she was admitted for further observation, pain control and facility placement. She was seen by therapy and deemed appropriate for discharged to SNF. She was discharged to [facility] on POD2 with medications for pain. The patient was established with a home health service prior to surgery.  Consults: None  Significant Diagnostic Studies: none  Treatments: surgery: As above. Please see separately dictated operative report for further details  Discharge Exam: Blood pressure (!) 147/67, pulse 82, temperature 98.2 F (36.8 C), temperature source Oral, resp. rate 14, height 5\' 1"  (1.549 m), weight 78.7 kg, SpO2 98 %. CN II-XII grossly intact  5/5 throughout BLE. Incision c/d/I with post-op bandage in place.  Disposition: Discharge disposition: 03-Skilled Nursing Facility      Discharge Instructions     Incentive spirometry RT   Complete by: As directed    Increase activity slowly   Complete by: As directed    Remove dressing in 24 hours   Complete by: As directed    Remove dressing in 24 hours   Complete by: As directed       Allergies as of 12/13/2020       Reactions   Ceftin [cefuroxime Axetil] Anaphylaxis   Lisinopril Anaphylaxis   Sulfa Antibiotics Other (See Comments)   Face swelling   Sulfasalazine Anaphylaxis   Morphine Other (See Comments)   Per patient, low blood pressure issues that requires action to raise it back up. Can take small infrequent doses    Xarelto [rivaroxaban] Other (See Comments)   Stomach burning, bleeding, and tar in stool   Adhesive [tape] Rash   Paper tape and tega derm OK   Antihistamines, Chlorpheniramine-type Other (See Comments)   Makes pt hyper   Antivert [meclizine Hcl] Other (See Comments)   Bladder will not empty   Aspirin Other (See Comments)   Sulfasalazine allergy cross reacts   Contrast Media [iodinated Diagnostic Agents] Rash   she is able to use betadine scrubs.   Decongestant [pseudoephedrine Hcl] Other (See Comments)   Makes pt hyper   Doxycycline Other (See Comments)   GI upset   Levaquin [levofloxacin In D5w] Rash   Polymyxin B Other (See Comments)   Facial rash   Tetanus Toxoids Rash, Other (See Comments)   Fever and hot to touch at injection site        Medication List     STOP taking these medications    HYDROcodone-acetaminophen 5-325 MG tablet Commonly known as: NORCO/VICODIN   oxyCODONE-acetaminophen 5-325 MG tablet Commonly known as: Percocet       TAKE these medications    Adalimumab 40 MG/0.4ML Pnkt Inject 40 mg into the skin every 14 (fourteen) days.   albuterol 108 (90 Base) MCG/ACT inhaler Commonly known as: Ventolin HFA Inhale 2 puffs into the lungs every 6 (six) hours as needed for wheezing or shortness of breath.   ALPRAZolam 0.25 MG tablet Commonly known as: XANAX TAKE 1 TABLET BY MOUTH AT BEDTIME AS NEEDED FOR ANXIETY.   amLODipine 2.5 MG tablet Commonly known as: NORVASC Take 1 tablet (  2.5 mg total) by mouth daily.   benzonatate 200 MG capsule Commonly known as: TESSALON Take 1 capsule (200 mg total) by mouth 3 (three) times daily as needed for cough.   budesonide 0.25 MG/2ML nebulizer solution Commonly known as: PULMICORT USE 1 VIAL  IN  NEBULIZER TWICE  DAILY - rinse mouth after treatment   cyclobenzaprine 10 MG tablet Commonly known as: FLEXERIL Take 1 tablet (10 mg total) by mouth 3 (three) times daily as needed for muscle spasms.    dicyclomine 10 MG capsule Commonly known as: BENTYL Take 1 capsule (10 mg total) by mouth 2 (two) times daily as needed for spasms.   escitalopram 10 MG tablet Commonly known as: LEXAPRO TAKE 1 TABLET BY MOUTH EVERY DAY   famotidine 20 MG tablet Commonly known as: PEPCID TAKE 1 TABLET (20 MG TOTAL) BY MOUTH DAILY. BEFORE BREAKFAST OR DINNER   fluticasone 50 MCG/ACT nasal spray Commonly known as: Flonase Place 2 sprays into both nostrils daily. In am   gabapentin 300 MG capsule Commonly known as: NEURONTIN Take 2 capsules (600 mg total) by mouth in the morning and at bedtime.   guaiFENesin 600 MG 12 hr tablet Commonly known as: MUCINEX Take 1 tablet (600 mg total) by mouth 2 (two) times daily.   ipratropium-albuterol 0.5-2.5 (3) MG/3ML Soln Commonly known as: DUONEB TAKE 3 MLS BY NEBULIZATION 3 (THREE) TIMES DAILY AS NEEDED. What changed: when to take this   lamoTRIgine 100 MG tablet Commonly known as: LAMICTAL TAKE 1 TAB 2 TIMES DAILY. FURTHER REFILLS NEW PSYCH FOR ALL PSYCH MEDS ONLY TEMP SUPPLY FROM PCP   leflunomide 20 MG tablet Commonly known as: ARAVA Take 1 tablet (20 mg total) by mouth daily.   lidocaine 5 % Commonly known as: Lidoderm Place 1 patch onto the skin 2 (two) times daily as needed. Remove & Discard patch within 12 hours or as directed by MD   lovastatin 20 MG tablet Commonly known as: MEVACOR TAKE 1 TABLET BY MOUTH EVERYDAY AT BEDTIME *STOP TALKING 40MG *   mirabegron ER 50 MG Tb24 tablet Commonly known as: Myrbetriq Take 1 tablet (50 mg total) by mouth daily.   montelukast 10 MG tablet Commonly known as: SINGULAIR TAKE 1 TABLET BY MOUTH EVERY DAY   nebivolol 10 MG tablet Commonly known as: Bystolic Take 0.5 tablets (5 mg total) by mouth in the morning and at bedtime.   oxyCODONE 5 MG immediate release tablet Commonly known as: Oxy IR/ROXICODONE Take 1 tablet (5 mg total) by mouth every 4 (four) hours as needed for up to 5 days for  moderate pain ((score 4 to 6)).   pantoprazole 40 MG tablet Commonly known as: PROTONIX TAKE 1 TABLET BY MOUTH 2 TIMES DAILY 30 MIN BEFORE FOOD (NOTE REDUCTION IN FREQUENCY)   predniSONE 5 MG tablet Commonly known as: DELTASONE Take 40mg  po daily for 2 days then 30mg  daily for 2 days then 20mg  daily for 2 days then 10mg  daily for 2 days then 7.5mg  daily   QUEtiapine 25 MG tablet Commonly known as: SEROQUEL TAKE 1 TABLET (25 MG TOTAL) BY MOUTH AT BEDTIME. AGAIN LAST FILL FURTHER REFILLS FROM PSYCHIATRY NO EXCEPTIONS   senna-docusate 8.6-50 MG tablet Commonly known as: Senokot-S Take 1 tablet by mouth at bedtime as needed for mild constipation.   sucralfate 1 g tablet Commonly known as: CARAFATE Take 1 tablet (1 g total) by mouth 2 (two) times daily.   zoledronic acid 5 MG/100ML Soln injection Commonly known as:  RECLAST Inject 5 mg into the vein once.        Follow-up Information     Loleta Dicker, PA Follow up in 2 week(s).   Why: For incision check. This appointment is already scheduled with the [facility] information: [place] [address] [phone]                 Signed: Loleta Dicker 12/13/2020, 9:15 AM

## 2020-12-11 NOTE — Anesthesia Postprocedure Evaluation (Signed)
Anesthesia Post Note  Patient: Kerri Carter  Procedure(s) Performed: LEFT L2-3 MICRODISCECTOMY, L3-4 AND L5-S1 DECOMPRESSION  Patient location during evaluation: PACU Anesthesia Type: General Level of consciousness: awake and alert, oriented and patient cooperative Pain management: pain level controlled Vital Signs Assessment: post-procedure vital signs reviewed and stable Respiratory status: spontaneous breathing, nonlabored ventilation and respiratory function stable Cardiovascular status: blood pressure returned to baseline and stable Postop Assessment: adequate PO intake Anesthetic complications: no   No notable events documented.   Last Vitals:  Vitals:   12/11/20 1345 12/11/20 1409  BP: (!) 150/62 (!) 147/65  Pulse: 81 83  Resp: 14 19  Temp:  (!) 36.4 C  SpO2: 96% 95%    Last Pain:  Vitals:   12/11/20 1409  TempSrc:   PainSc: 0-No pain                 Darrin Nipper

## 2020-12-11 NOTE — H&P (Signed)
History of Present Illness: 12/11/2020 Ms. Randle is here today with continued leg pain.  She will undergo surgical intervention.  10/24/2020  Ms. Iannaccone continues to have pain. She primarily has left leg pain at this point. Is down the back of her leg and also in the front of her thigh.  Her iliopsoas injection helped with her right groin pain.  10/14/20 - R iliopsoas bursa injection 09/27/20 - B L5-S1 TFESI  05/30/2020 Ms. Peplinski has been doing physical therapy at home. It has not helped. She continues to have pain.  04/18/2020 Ms. Herminia Warren is here today with a chief complaint of bilateral low back pain without radiation into the legs.  She had a fall approximately a year ago. Unfortunate, her husband passed away from [place] in 03-21-19. Around that time, she had a fall down 5 steps and hit her back.  She is been having back pain since that time. She has trouble with walking and standing. She does not have any pain down her legs. It has not really improved over time. She has been doing physical therapy at her home for her shoulders. She has no new weakness or bowel or bladder dysfunction.  Conservative measures:  Physical therapy: has not participated in for this flare Multimodal medical therapy including regular antiinflammatories:  Injections: has not had epidural steroid injections  Past Surgery: L4-5 XLIF/PSF on 09/08/2016 by me  Vickii Penna has no symptoms of cervical myelopathy.  The symptoms are causing a significant impact on the patient's life.   08/12/2016 Jetty Kathi Der Dayley is a 80 y.o. female who presents with the chief complaint of back and leg pain.  Duration: 1 year Location: low back and down R>L leg to the side of ankles Quality: achy Severity: 10  Precipitating: aggravated by standing and bending Modifying factors: made better by sitting down Weakness: yes - sometimes they don't cooperate when she's walking, like she might fall over Timing: not  really Bowel/Bladder Dysfunction: none  Conservative measures:  Physical therapy: hasn't done - can't afford copay Multimodal medical therapy including regular antiinflammatories: tramadol  Injections: 2 epidural steroid injections - March 2018  Past Surgery: none  Dior Dominik has some symptoms of cervical myelopathy - dropping items, poor grip strength, some imbalance for 1-2 years.  The symptoms are causing a significant impact on the patient's life.   Review of Systems:  A 10 point review of systems is negative, except for the pertinent positives and negatives detailed in the HPI.  Past Medical History: Past Medical History:  Diagnosis Date   Anemia   Anxiety 06/13/2013   Aortic insufficiency   Arrhythmia   Asthma without status asthmaticus, unspecified  no recent flare up per pt on 09/08/16   CHF (congestive heart failure) (CMS-HCC)  Patient states she has not be advised that she has this   Colitis, unspecified   Conversion disorder   COPD (chronic obstructive pulmonary disease) (CMS-HCC)   Coronary artery disease   Depression   Diabetes mellitus type 2, uncomplicated (CMS-HCC)  diet controlled   Gastritis   GERD (gastroesophageal reflux disease)   Hyperlipidemia   Hypertension   Inflammatory arthritis GWK  a. Low titer rheumatoid factor. b. Negative anti-CCP antibodies. c. Plaquenil.   Kidney disease   Osteoarthritis GWK  a. Hands. b. Carpal tunnel. c. Dupuytren's contractures.   Osteoporosis   Peripheral neuropathy   Polycystic kidney disease   Renal artery stenosis (CMS-HCC)   Renal insufficiency   Rheumatoid arthritis,  adult (CMS-HCC)   Sleep apnea  uses c-pap   Ulcer   Unspecified hypothyroidism   Valvular heart disease  AI, MR, TR   Past Surgical History: Past Surgical History:  Procedure Laterality Date   ANTERIOR THORACIC SPINE FUSION MULTIPLE LEVELS N/A 09/08/2016  Procedure: ARTHRODESIS, ANTERIOR INTERBODY TECHNIQUE; Surgeon: Ricard Dillon, MD; Location: [place]; Service: Neurosurgery; Laterality: N/A;   APPENDECTOMY 1961   APPENDECTOMY   AUTOGRAFT OBTAINED SAME INCISION FOR SPINE SURGERY N/A 09/08/2016  Procedure: AUTOGRAFT FOR SPINE SURGERY ONLY; Surgeon: Ricard Dillon, MD; Location: [place]; Service: Neurosurgery; Laterality: N/A;   CARDIAC CATHETERIZATION 2011 and 2013   CESAREAN SECTION   CHOLECYSTECTOMY   cholecystectomy 05/11/16   COLONOSCOPY 09/17/2006  Adenomatous Polyp   COLONOSCOPY 03/05/2011  PH Adenomatous Polyp: CBF 02/2016; Recall Ltr mailed 12/20/2015 (dw)   COLONOSCOPY 04/02/2016  Adenomatous Polyps (Dr. Vicente Males MD)   DUODENUMOSCOPY Arnoldo Morale BLEEDING 06/29/2012  Procedure: Regino Schultze Arnoldo Morale BLEEDING; Surgeon: Johnnette Litter, MD; Location: [facility]; Service: Gastroenterology;;   EGD 03/05/2011  11/12/2008, 09/17/2006   EGD 04/02/2016  Gastric Polyps, Duodenal Diverticulum (Dr. Vicente Males MD)   EXTRACTION CATARACT EXTRACAPSULAR W/INSERTION INTRAOCULAR PROSTHESIS Right 01/13/2016  Procedure: R - Cataract Extraction with intraocular lens implantation,; Surgeon: Moody Bruins, MD; Location: [facility]; Service: Ophthalmology; Laterality: Right;   EXTRACTION CATARACT EXTRACAPSULAR W/INSERTION INTRAOCULAR PROSTHESIS Left 01/29/2016  Procedure: -L- Cataract Extraction with intraocular lens implantation, left eye; Surgeon: Moody Bruins, MD; Location: [facility]; Service: Ophthalmology; Laterality: Left;   INSERTION MORSELIZED BONE ALLOGRAFT FOR SPINE SURGERY N/A 09/08/2016  Procedure: ALLOGRAFT, MORSELIZED, OR PLACEMENT OF OSTEOPROMOTIVE MATERIAL,; Surgeon: Ricard Dillon, MD; Location: [place]; Service: Neurosurgery; Laterality: N/A;   JOINT REPLACEMENT Right   LAMINECTOMY POSTERIOR LUMBAR FACETECTOMY & FORAMINOTOMY W/DECOMP N/A 09/08/2016  Procedure: LAMINECTOMY, FACETECTOMY AND FORAMINOTOMY; Surgeon: Ricard Dillon, MD; Location: [place]; Service:  Neurosurgery; Laterality: N/A;   LAPAROSCOPIC TUBAL LIGATION  open not laproscopic   left hip replacement Left 12/10/2011  Cabery Reg   NEPHRECTOMY Right 1986   Reverse left total shoulder arthroplasty Left 07/26/2018  Dr.Poggi   Reverse right total shoulder arthroplasty Right 11/04/2017  Dr.Poggi   Right hip bipolar hemiarthroplasty. Right 11/01/2016  Dr. Roland Rack   Right total knee replacement Right 07/13/2013   Takedown of nonunion and open reduction and internal fixation of left acromial fracture with bone grafting Left 12/21/2019  Dr. Roland Rack   TONSILLECTOMY 1960   TUBAL LIGATION    Allergies  Allergen Reactions   Ceftin [Cefuroxime Axetil] Anaphylaxis   Lisinopril Anaphylaxis   Sulfa Antibiotics Other (See Comments)    Face swelling   Sulfasalazine Anaphylaxis   Morphine Other (See Comments)    Per patient, low blood pressure issues that requires action to raise it back up. Can take small infrequent doses   Xarelto [Rivaroxaban] Other (See Comments)    Stomach burning, bleeding, and tar in stool   Adhesive [Tape] Rash    Paper tape and tega derm OK   Antihistamines, Chlorpheniramine-Type Other (See Comments)    Makes pt hyper   Antivert [Meclizine Hcl] Other (See Comments)    Bladder will not empty   Aspirin Other (See Comments)    Sulfasalazine allergy cross reacts   Contrast Media [Iodinated Diagnostic Agents] Rash    she is able to use betadine scrubs.   Decongestant [Pseudoephedrine Hcl] Other (See Comments)    Makes pt hyper   Doxycycline Other (See Comments)    GI upset  Levaquin [Levofloxacin In D5w] Rash   Polymyxin B Other (See Comments)    Facial rash   Tetanus Toxoids Rash and Other (See Comments)    Fever and hot to touch at injection site    Current Meds  Medication Sig   Adalimumab 40 MG/0.4ML PNKT Inject 40 mg into the skin every 14 (fourteen) days.   albuterol (VENTOLIN HFA) 108 (90 Base) MCG/ACT inhaler Inhale 2 puffs into the lungs every 6  (six) hours as needed for wheezing or shortness of breath.   ALPRAZolam (XANAX) 0.25 MG tablet TAKE 1 TABLET BY MOUTH AT BEDTIME AS NEEDED FOR ANXIETY.   amLODipine (NORVASC) 2.5 MG tablet Take 1 tablet (2.5 mg total) by mouth daily.   benzonatate (TESSALON) 200 MG capsule Take 1 capsule (200 mg total) by mouth 3 (three) times daily as needed for cough.   budesonide (PULMICORT) 0.25 MG/2ML nebulizer solution USE 1 VIAL  IN  NEBULIZER TWICE  DAILY - rinse mouth after treatment   cyclobenzaprine (FLEXERIL) 10 MG tablet Take 1 tablet (10 mg total) by mouth 3 (three) times daily as needed for muscle spasms.   dicyclomine (BENTYL) 10 MG capsule Take 1 capsule (10 mg total) by mouth 2 (two) times daily as needed for spasms.   escitalopram (LEXAPRO) 10 MG tablet TAKE 1 TABLET BY MOUTH EVERY DAY   famotidine (PEPCID) 20 MG tablet TAKE 1 TABLET (20 MG TOTAL) BY MOUTH DAILY. BEFORE BREAKFAST OR DINNER   fluticasone (FLONASE) 50 MCG/ACT nasal spray Place 2 sprays into both nostrils daily. In am   gabapentin (NEURONTIN) 300 MG capsule Take 2 capsules (600 mg total) by mouth in the morning and at bedtime.   guaiFENesin (MUCINEX) 600 MG 12 hr tablet Take 1 tablet (600 mg total) by mouth 2 (two) times daily.   HYDROcodone-acetaminophen (NORCO/VICODIN) 5-325 MG tablet Take 1 tablet by mouth every 6 (six) hours as needed for moderate pain.   ipratropium-albuterol (DUONEB) 0.5-2.5 (3) MG/3ML SOLN TAKE 3 MLS BY NEBULIZATION 3 (THREE) TIMES DAILY AS NEEDED. (Patient taking differently: Take 3 mLs by nebulization 2 (two) times daily.)   lamoTRIgine (LAMICTAL) 100 MG tablet TAKE 1 TAB 2 TIMES DAILY. FURTHER REFILLS NEW PSYCH FOR ALL PSYCH MEDS ONLY TEMP SUPPLY FROM PCP   leflunomide (ARAVA) 20 MG tablet Take 1 tablet (20 mg total) by mouth daily.   lidocaine (LIDODERM) 5 % Place 1 patch onto the skin 2 (two) times daily as needed. Remove & Discard patch within 12 hours or as directed by MD   lovastatin (MEVACOR) 20 MG  tablet TAKE 1 TABLET BY MOUTH EVERYDAY AT BEDTIME *STOP TALKING 40MG *   mirabegron ER (MYRBETRIQ) 50 MG TB24 tablet Take 1 tablet (50 mg total) by mouth daily.   montelukast (SINGULAIR) 10 MG tablet TAKE 1 TABLET BY MOUTH EVERY DAY   nebivolol (BYSTOLIC) 10 MG tablet Take 0.5 tablets (5 mg total) by mouth in the morning and at bedtime.   oxyCODONE-acetaminophen (PERCOCET) 5-325 MG tablet Take 1 tablet by mouth every 4 (four) hours as needed for severe pain.   pantoprazole (PROTONIX) 40 MG tablet TAKE 1 TABLET BY MOUTH 2 TIMES DAILY 30 MIN BEFORE FOOD (NOTE REDUCTION IN FREQUENCY)   predniSONE (DELTASONE) 5 MG tablet Take 40mg  po daily for 2 days then 30mg  daily for 2 days then 20mg  daily for 2 days then 10mg  daily for 2 days then 7.5mg  daily   QUEtiapine (SEROQUEL) 25 MG tablet TAKE 1 TABLET (25 MG TOTAL) BY MOUTH  AT BEDTIME. AGAIN LAST FILL FURTHER REFILLS FROM PSYCHIATRY NO EXCEPTIONS   sucralfate (CARAFATE) 1 g tablet Take 1 tablet (1 g total) by mouth 2 (two) times daily.   zoledronic acid (RECLAST) 5 MG/100ML SOLN injection Inject 5 mg into the vein once.    Social History: Social History   Tobacco Use   Smoking status: Former Smoker  Years: 30.00  Types: Cigarettes  Quit date: 02/25/1975  Years since quitting: 45.6   Smokeless tobacco: Never Used  Vaping Use   Vaping Use: Never used  Substance Use Topics   Alcohol use: No  Alcohol/week: 0.0 standard drinks   Drug use: No   Family Medical History: Family History  Problem Relation Age of Onset   Stroke Mother   Heart failure Father   Hyperlipidemia (Elevated cholesterol) Sister   Dementia Sister   Cancer Sister   Heart disease Sister   Diabetes type II Sister   High blood pressure (Hypertension) Sister   Heart disease Sister   Lung cancer Sister   Myocardial Infarction (Heart attack) Brother   Aortic dissection Brother   Suicidality Brother   Kidney disease Son   Diabetes Son   Dementia Son   Lung cancer Other    Breast cancer Other   Anesthesia problems Neg Hx   Malignant hypertension Neg Hx   Glaucoma Neg Hx   Macular degeneration Neg Hx   Physical Examination: Vitals:   Today's Vitals   12/11/20 0822  BP: (!) 180/62  Pulse: 96  Resp: 18  Temp: 98.4 F (36.9 C)  TempSrc: Temporal  SpO2: 96%  PainSc: 0-No pain   Heart sounds normal no MRG. Chest Clear to Auscultation Bilaterally.  General: Patient is well developed, well nourished, calm, collected, and in no apparent distress. Attention to examination is appropriate.  Psychiatric: Patient is non-anxious.  Head: Pupils equal, round, and reactive to light.  ENT: Oral mucosa appears well hydrated.  Neck: Supple. Full range of motion.  Respiratory: Patient is breathing without any difficulty.  Extremities: No edema.  Vascular: Palpable dorsal pedal pulses.  Skin: On exposed skin, there are no abnormal skin lesions.  NEUROLOGICAL:  General: In no acute distress.  Awake, alert, oriented to person, place, and time. Speech is clear and fluent. Fund of knowledge is appropriate.   Cranial Nerves: Pupils equal round and reactive to light. Facial tone is symmetric. Facial sensation is symmetric. Shoulder shrug is symmetric. Tongue protrusion is midline. There is no pronator drift.  ROM of spine: diminished with back pain. Palpation of spine: non tender.   Strength: Side Biceps Triceps Deltoid Interossei Grip Wrist Ext. Wrist Flex.  R 5 5 5 5 5 5 5   L 5 5 5 5 5 5 5    Side Iliopsoas Quads Hamstring PF DF EHL  R 5 5 5 5 5 5   L 5 5 5 5 5 5    Reflexes are 1+ and symmetric at the biceps, triceps, brachioradialis, patella and achilles. Bilateral upper and lower extremity sensation is intact to light touch. Clonus is not present. Toes are down-going. Gait is normal. No difficulty with tandem gait. Hoffman's is absent.  Rapid alternating movements are normal.   Medical Decision Making  Imaging: MRI L spine  08/04/16 IMPRESSION: L4-5: Advanced bilateral facet arthropathy with 8 mm anterolisthesis. Pseudo disc herniation. Severe multifactorial spinal stenosis likely to cause neural compression. The facet arthropathy could also contribute to low back pain.  L5-S1: Chronic disc degeneration. Foraminal narrowing on the left because  of encroachment by osteophyte and or chronic disc herniation.  Electronically Signed   By: Nelson Chimes M.D.   On: 08/04/2016 15:58  MRI L spine 07/26/2020 IMPRESSION:  Moderate spinal stenosis L2-3. Right-sided disc protrusion with  right L3 nerve root impingement.   Small synovial cyst on the left at L3-4 contributing to mild left  subarticular stenosis. Question left L4 nerve root symptoms.   PLIF L4-5 with mild spinal stenosis   Moderate foraminal encroachment bilaterally L5-S1 due to spurring.   Electronically Signed    By: Franchot Gallo M.D.    On: 07/26/2020 16:29  I have personally reviewed the images and agree with the above interpretation.  Assessment and Plan: Ms. Gilberto is a pleasant 80 y.o. female who previously had an L4-5 lateral lumbar interbody fusion with an excellent response.  She is now having symptoms consistent with left-sided lumbar radiculopathy at multiple levels. She has stenosis at L2-3, L3-4, and L5-S1.  We will mvoe forward with L L2-3 discectomy, L3-4 and L5-S1 decompression.  I discussed the planned procedure at length with the patient, including the risks, benefits, alternatives, and indications. The risks discussed include but are not limited to bleeding, infection, need for reoperation, spinal fluid leak, stroke, vision loss, anesthetic complication, coma, paralysis, and even death. I also described in detail that improvement was not guaranteed.  The patient expressed understanding of these risks, and asked that we proceed with surgery. I described the surgery in layman's terms, and gave ample opportunity for questions,  which were answered to the best of my ability.   Margarethe Virgen K. [name] MD, [place] [facility]

## 2020-12-11 NOTE — Transfer of Care (Signed)
Immediate Anesthesia Transfer of Care Note  Patient: Kerri Carter  Procedure(s) Performed: LEFT L2-3 MICRODISCECTOMY, L3-4 AND L5-S1 DECOMPRESSION  Patient Location: PACU  Anesthesia Type:General  Level of Consciousness: awake  Airway & Oxygen Therapy: Patient Spontanous Breathing and Patient connected to face mask oxygen  Post-op Assessment: Report given to RN and Post -op Vital signs reviewed and stable  Post vital signs: Reviewed and stable  Last Vitals:  Vitals Value Taken Time  BP    Temp    Pulse    Resp    SpO2      Last Pain:  Vitals:   12/11/20 0822  TempSrc: Temporal  PainSc: 0-No pain         Complications: No notable events documented.

## 2020-12-11 NOTE — Anesthesia Procedure Notes (Signed)
Procedure Name: Intubation Date/Time: 12/11/2020 9:53 AM Performed by: Chanetta Marshall, CRNA Pre-anesthesia Checklist: Patient identified, Emergency Drugs available, Suction available and Patient being monitored Patient Re-evaluated:Patient Re-evaluated prior to induction Oxygen Delivery Method: Circle system utilized Preoxygenation: Pre-oxygenation with 100% oxygen Induction Type: IV induction Ventilation: Mask ventilation without difficulty Laryngoscope Size: McGraph and 3 Grade View: Grade I Tube type: Oral Tube size: 7.0 mm Number of attempts: 1 Airway Equipment and Method: Video-laryngoscopy Placement Confirmation: ETT inserted through vocal cords under direct vision, positive ETCO2 and breath sounds checked- equal and bilateral Secured at: 21 cm Tube secured with: Tape Dental Injury: Teeth and Oropharynx as per pre-operative assessment

## 2020-12-11 NOTE — Op Note (Signed)
Indications: Kerri Carter is a 80 yo female who presented with lumbar radiculopathy as well as neurogenic claudication. She failed conservative management and opted for surgical intervention.  Findings: disc herniation, stenosis  Preoperative Diagnosis: Lumbar radiculopathy, neurogenic claudication Postoperative Diagnosis: same   EBL: 50 ml IVF: 1000 ml Drains: none Disposition: Extubated and Stable to PACU Complications: none  A foley catheter was placed.   Preoperative Note:   Risks of surgery discussed include: infection, bleeding, stroke, coma, death, paralysis, CSF leak, nerve/spinal cord injury, numbness, tingling, weakness, complex regional pain syndrome, recurrent stenosis and/or disc herniation, vascular injury, development of instability, neck/back pain, need for further surgery, persistent symptoms, development of deformity, and the risks of anesthesia. The patient understood these risks and agreed to proceed.  Operative Note:   1) Left L2/3 microdiscectomy 2) L3-4 and L5-S1 lumbar decompression  The patient was then brought from the preoperative center with intravenous access established.  The patient underwent general anesthesia and endotracheal tube intubation, and was then rotated on the [place] rail top where all pressure points were appropriately padded.  The skin was then thoroughly cleansed.  Perioperative antibiotic prophylaxis was administered.  Sterile prep and drapes were then applied and a timeout was then observed.  C-arm was brought into the field under sterile conditions, and the L2-3 and L5-S1 disc spaces identified and marked with an incision on the left 1cm lateral to midline.  Once this was complete a 2 cm incision was opened with the use of a #10 blade knife.    The metrx tubes were sequentially advanced and confirmed in position at L5-S1. An 85mm by 57mm tube was locked in place to the bed side attachment.  Fluoroscopy was then removed from the field.   The microscope was then sterilely brought into the field and muscle creep was hemostased with a bipolar and resected with a pituitary rongeur.  A Bovie extender was then used to expose the spinous process and lamina.  Careful attention was placed to not violate the facet capsule. A 3 mm matchstick drill bit was then used to make a hemi-laminotomy trough until the ligamentum flavum was exposed.  This was extended to the base of the spinous process and to the contralateral side to remove all the central bone from each side.  Once this was complete and the underlying ligamentum flavum was visualized, it was dissected with a curette and resected with Kerrison rongeurs.  Extensive ligamentum hypertrophy was noted, requiring a substantial amount of time and care for removal.  The dura was identified and palpated. The kerrison rongeur was then used to remove the medial facet on the left until no compression was noted.  A balltip probe was used to confirm decompression of the ipsilateral S1 nerve root.  No CSF leak was noted.  A Depo-Medrol soaked Gelfoam pledget was placed in the defect.  The wound was copiously irrigated. The tube system was then removed under microscopic visualization and hemostasis was obtained with a bipolar.    After performing the decompression at L5-S1, a second incision was made above L2-4. The metrx tubes were sequentially advanced and confirmed in position at L3-4. An 26mm by 31mm tube was locked in place to the bed side attachment.  Fluoroscopy was then removed from the field.  The microscope was then sterilely brought into the field and muscle creep was hemostased with a bipolar and resected with a pituitary rongeur.  A Bovie extender was then used to expose the spinous process and lamina.  Careful attention was placed to not violate the facet capsule. A 3 mm matchstick drill bit was then used to make a hemi-laminotomy trough until the ligamentum flavum was exposed.  This was extended to  the base of the spinous process and to the contralateral side to remove all the central bone from each side.  Once this was complete and the underlying ligamentum flavum was visualized, it was dissected with a curette and resected with Kerrison rongeurs.  Extensive ligamentum hypertrophy was noted, requiring a substantial amount of time and care for removal.  The dura was identified and palpated. The kerrison rongeur was then used to remove the medial facet on the left including a small cyst until no compression was noted.  A balltip probe was used to confirm decompression of the ipsilateral L4 nerve root.  No CSF leak was noted.  A Depo-Medrol soaked Gelfoam pledget was placed in the defect.  The wound was copiously irrigated. The tube system was then removed under microscopic visualization and hemostasis was obtained with a bipolar.     After completing the decompression at L3-4, the Metrx tubes were sequentially advanced under lateral fluoroscopy until a 18 x 60 mm Metrx tube was placed over the L L2-3 facet and lamina and secured to the bed.    The microscope was then sterilely brought into the field and muscle creep was hemostased with a bipolar and resected with a pituitary rongeur.  A Bovie extender was then used to expose the spinous process and lamina.  Careful attention was placed to not violate the facet capsule. A 3 mm matchstick drill bit was then used to make a hemi-laminotomy trough until the ligamentum flavum was exposed.  This was extended to the base of the spinous process.  Once this was complete and the underlying ligamentum flavum was visualized this was dissected with an up angle curette and resected with a #2 and #3 mm biting Kerrison.  The laminotomy opening was also expanded in similar fashion and hemostasis was obtained with Surgifoam and a patty as well as bone wax.  The rostral aspect of the caudal level of the lamina was also resected with a #2 biting Kerrison effort to further  enhance exposure.  Once the underlying dura was visualized a Penfield 4 was then used to dissect and expose the traversing nerve root.  Once this was identified a nerve root retractor suction was used to mobilize this medially.  The venous plexus was hemostased with Surgifoam and light bipolar use.  A small penfield was used to open disc.   The disc herniation was identified and dissected free using a balltip probe. The pituitary rongeur was used to remove the extruded disc fragments. Once the thecal sac and nerve root were noted to be relaxed and under less tension the ball-tipped feeler was passed along the foramen distally to to ensure no residual compression was noted.   The tube system was then removed under microscopic visualization and hemostasis was obtained with a bipolar.    The fascial layer was reapproximated with the use of a 0- Vicryl suture.  Subcutaneous tissue layer was reapproximated using 2-0 Vicryl suture.  3-0 monocryl was used on the skin. The skin was then cleansed and Dermabond was used to close the skin opening.  Patient was then rotated back to the preoperative bed awakened from anesthesia and taken to recovery all counts are correct in this case.   I performed the entire procedure with the assistance of Cooper Render  PA as an Pensions consultant.  Meade Maw MD

## 2020-12-11 NOTE — Progress Notes (Signed)
Pharmacy Antibiotic Note  Kerri Carter is a 80 y.o. female admitted on (Not on file) with surgical prophylaxis.  Pharmacy has been consulted for Vancomycin dosing.  TBW = 78.7 kg   Plan: Vancomycin 1250 mg IV X 1 60 min pre-op ordered for 11/9 @ 0500.      No data recorded.  No results for input(s): WBC, CREATININE, LATICACIDVEN, VANCOTROUGH, VANCOPEAK, VANCORANDOM, GENTTROUGH, GENTPEAK, GENTRANDOM, TOBRATROUGH, TOBRAPEAK, TOBRARND, AMIKACINPEAK, AMIKACINTROU, AMIKACIN in the last 168 hours.  Estimated Creatinine Clearance: 29.8 mL/min (A) (by C-G formula based on SCr of 1.48 mg/dL (H)).    Allergies  Allergen Reactions   Ceftin [Cefuroxime Axetil] Anaphylaxis   Lisinopril Anaphylaxis   Sulfa Antibiotics Other (See Comments)    Face swelling   Sulfasalazine Anaphylaxis   Morphine Other (See Comments)    Per patient, low blood pressure issues that requires action to raise it back up. Can take small infrequent doses   Xarelto [Rivaroxaban] Other (See Comments)    Stomach burning, bleeding, and tar in stool   Adhesive [Tape] Rash    Paper tape and tega derm OK   Antihistamines, Chlorpheniramine-Type Other (See Comments)    Makes pt hyper   Antivert [Meclizine Hcl] Other (See Comments)    Bladder will not empty   Aspirin Other (See Comments)    Sulfasalazine allergy cross reacts   Contrast Media [Iodinated Diagnostic Agents] Rash    she is able to use betadine scrubs.   Decongestant [Pseudoephedrine Hcl] Other (See Comments)    Makes pt hyper   Doxycycline Other (See Comments)    GI upset   Levaquin [Levofloxacin In D5w] Rash   Polymyxin B Other (See Comments)    Facial rash   Tetanus Toxoids Rash and Other (See Comments)    Fever and hot to touch at injection site    Antimicrobials this admission:   >>    >>   Dose adjustments this admission:   Microbiology results:  BCx:   UCx:    Sputum:    MRSA PCR:   Thank you for allowing pharmacy to be a part of this  patient's care.  Karrin Eisenmenger D 12/11/2020 2:30 AM

## 2020-12-11 NOTE — Progress Notes (Signed)
Went to give patient her nightly medications. I did wake the patient up gently and calmly. Patient did not want to take her nebulizer with her pills, she was upset saying they cannot be taken with the nebulizer. I explained I was going to give her medications and then do the nebulizer. Patient refused again and said who ever wrote the order did not know what they were doing.  I listed the medications I had to give her, and she became upset and somewhat angry and said, "yes I take those medications at night." I said her if she wanted them and she said no I am leaving here today. I explained to the patient it is 9:30pm. Patient closed her eyes and never responded.   Nurse Jackelyn Poling went in to speak with patient and patient voiced to her that her insurance would not pay for observation and she was not taking the medications because she was not going to get a bill to have to pay for them and if she did get a bill she would send it back to the hospital and she was not going to pay it. "They can take the bill and wipe their ass with it."  Debbie explained patient was admitted as med-surg.

## 2020-12-11 NOTE — Discharge Instructions (Signed)
  Your surgeon has performed an operation on your lumbar spine (low back) to relieve pressure on one or more nerves. Many times, patients feel better immediately after surgery and can "overdo it." Even if you feel well, it is important that you follow these activity guidelines. If you do not let your back heal properly from the surgery, you can increase the chance of a disc herniation and/or return of your symptoms. The following are instructions to help in your recovery once you have been discharged from the hospital.  * Do not take anti-inflammatory medications for 3 days after surgery (naproxen [Aleve], ibuprofen [Advil, Motrin], celecoxib [Celebrex], etc.)  Activity    No bending, lifting, or twisting ("BLT"). Avoid lifting objects heavier than 10 pounds (gallon milk jug).  Where possible, avoid household activities that involve lifting, bending, pushing, or pulling such as laundry, vacuuming, grocery shopping, and childcare. Try to arrange for help from friends and family for these activities while your back heals.  Increase physical activity slowly as tolerated.  Taking short walks is encouraged, but avoid strenuous exercise. Do not jog, run, bicycle, lift weights, or participate in any other exercises unless specifically allowed by your doctor. Avoid prolonged sitting, including car rides.  Talk to your doctor before resuming sexual activity.  You should not drive until cleared by your doctor.  Until released by your doctor, you should not return to work or school.  You should rest at home and let your body heal.   You may shower two days after your surgery.  After showering, lightly dab your incision dry. Do not take a tub bath or go swimming for 3 weeks, or until approved by your doctor at your follow-up appointment.  If you smoke, we strongly recommend that you quit.  Smoking has been proven to interfere with normal healing in your back and will dramatically reduce the success rate of  your surgery. Please contact [facility] (800-QUIT-NOW) and use the resources at www.[facility].com for assistance in stopping smoking.  Surgical Incision   If you have a dressing on your incision, you may remove it two days after your surgery. Keep your incision area clean and dry.  Your incision was closed with Dermabond glue. The glue should begin to peel away within about a week   Diet            You may return to your usual diet. Be sure to stay hydrated.  When to Contact us  Although your surgery and recovery will likely be uneventful, you may have some residual numbness, aches, and pains in your back and/or legs. This is normal and should improve in the next few weeks.  However, should you experience any of the following, contact us immediately: New numbness or weakness Pain that is progressively getting worse, and is not relieved by your pain medications or rest Bleeding, redness, swelling, pain, or drainage from surgical incision Chills or flu-like symptoms Fever greater than 101.0 F (38.3 C) Problems with bowel or bladder functions Difficulty breathing or shortness of breath Warmth, tenderness, or swelling in your calf  Contact Information During office hours (Monday-Friday 9 am to 5 pm), please call your physician at [phone] After hours and weekends, please call [phone] and speak with the answering service, who will contact the doctor on call.  If that fails, call the [place] Operator at [phone] and ask for the Neurosurgery Resident On Call  For a life-threatening emergency, call 911

## 2020-12-11 NOTE — Anesthesia Preprocedure Evaluation (Addendum)
Anesthesia Evaluation  Patient identified by MRN, date of birth, ID band Patient awake    Reviewed: Allergy & Precautions, H&P , NPO status , Patient's Chart, lab work & pertinent test results, reviewed documented beta blocker date and time   History of Anesthesia Complications Negative for: history of anesthetic complications  Airway Mallampati: III  TM Distance: >3 FB Neck ROM: full    Dental  (+) Edentulous Upper, Edentulous Lower, Upper Dentures, Lower Dentures, Dental Advidsory Given   Pulmonary shortness of breath and with exertion, asthma , sleep apnea , COPD,  COPD inhaler, neg recent URI, former smoker,  Seen by pulmonology service. She has hx COPD overlap syndrome, nocturnal hypoxemia and NSIP d.t rheumatoid. Last exacerbation was on September 27th requiring hospitalization. She ambulates with walker. She uses 2L oxygen at night. She uses budesonide and ipratropium-albuterol twice a day and takes prednisone 5mg  daily. She has chronic dyspnea with exertion.    Pulmonary exam normal        Cardiovascular Exercise Tolerance: Poor METS: < 3 Mets hypertension, Pt. on medications and Pt. on home beta blockers (-) angina+ CAD (non-obstructive), + Peripheral Vascular Disease and + DOE  (-) Past MI, (-) Cardiac Stents and (-) CABG (-) dysrhythmias + Valvular Problems/Murmurs  Rhythm:Regular Rate:Normal  Followed by cardiology  TTE 07/06/2015: EF 55-60%; Mild MR/AR/TR; Mild AS with MPG 13.0 mmHg.  TTE performed on 10/06/2019 revealed normal left ventricular systolic function with trivial to mild valvular insufficiency; LVEF >55%   Neuro/Psych PSYCHIATRIC DISORDERS (conversion disorder) Anxiety Depression Bipolar Disorder -Spinal stenosis of lumbar region with neurogenic claudication -Lumbar radiculopathy  Neuromuscular disease    GI/Hepatic Neg liver ROS, hiatal hernia, PUD (h/o), GERD  Medicated and Controlled,S/p esophageal  dilation   Endo/Other  negative endocrine ROS  Renal/GU CRFRenal disease (s/p nephrectomy 1988, CKD stage III)  negative genitourinary   Musculoskeletal  (+) Arthritis , Osteoarthritis and Rheumatoid disorders,    Abdominal (+) + obese,   Peds  Hematology negative hematology ROS (+)   Anesthesia Other Findings Per Pre-care Note: - Per cardiology, "this patient is considered optimized for surgery and is at an ACCEPTABLE risk to proceed.  She does not have known CAD and is on Plavix through neurology, as she does not need to be on this medication from a cardiac standpoint.  Clearance for holding this medication will need to come from neurology".  - Per pulmonary medicine, "this patient is optimized for surgery and may proceed with the planned procedural course with a MODERATE risk of significant perioperative pulmonary complications". Pre-operative CXR was recommended; done on 12/04/2020.  - Per neurology, "Kerri Carter continues on Plavix for a secondary stroke prevention's perspective, as she is intolerant of aspirin.  It is reasonable to have her discontinue Plavix 7 days prior to her procedure and resume when deemed appropriate by her surgeon. She is at an overall LOW risk for cerebrovascular complications".  Patient is aware that her last dose of clopidogrel will be on 12/03/2020.      Past Medical History: No date: Anxiety No date: Asthma No date: Cataract     Comment:  s/p b/l repair  No date: Chicken pox No date: CKD (chronic kidney disease) No date: CKD (chronic kidney disease), stage III (HCC)     Comment:  a. s/p R nephrectomy. No date: Conversion disorder No date: COPD (chronic obstructive pulmonary disease) (HCC) No date: Depression No date: Essential hypertension No date: GERD (gastroesophageal reflux disease) No date: Hyperlipidemia No date: Inflammatory  arthritis     Comment:  a. hands/carpal tunnel.  b. Low titer rheumatoid factor.              c. Negative  anti-CCP antibodies. d. Plaquenil. No date: Non-Obstructive CAD     Comment:  a. 07/2009 Cath ([facility]): nonobs dzs;  b. 03/2011 Cath               [facility]): nonobs dzs. No date: Osteoarthritis     Comment:  a. Knees. 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 07/2015 Echo: EF 55-60%, Mild AI, AS, MR, and TR.   Reproductive/Obstetrics negative OB ROS                           Anesthesia Physical  Anesthesia Plan  ASA: III  Anesthesia Plan: General   Post-op Pain Management:    Induction: Intravenous  PONV Risk Score and Plan: 3 and Ondansetron, Dexamethasone and Treatment may vary due to age or medical condition  Airway Management Planned: Oral ETT  Additional Equipment: None  Intra-op Plan:   Post-operative Plan: Extubation in OR  Informed Consent: I have reviewed the patients History and Physical, chart, labs and discussed the procedure including the risks, benefits and alternatives for the proposed anesthesia with the patient or authorized representative who has indicated his/her understanding and acceptance.     Dental advisory given  Plan Discussed with: Anesthesiologist, CRNA and Surgeon  Anesthesia Plan Comments:       Anesthesia Quick Evaluation

## 2020-12-12 ENCOUNTER — Encounter: Payer: Self-pay | Admitting: Neurosurgery

## 2020-12-12 DIAGNOSIS — I2510 Atherosclerotic heart disease of native coronary artery without angina pectoris: Secondary | ICD-10-CM | POA: Diagnosis not present

## 2020-12-12 DIAGNOSIS — J45909 Unspecified asthma, uncomplicated: Secondary | ICD-10-CM | POA: Diagnosis not present

## 2020-12-12 DIAGNOSIS — E039 Hypothyroidism, unspecified: Secondary | ICD-10-CM | POA: Diagnosis not present

## 2020-12-12 DIAGNOSIS — I509 Heart failure, unspecified: Secondary | ICD-10-CM | POA: Diagnosis not present

## 2020-12-12 DIAGNOSIS — J449 Chronic obstructive pulmonary disease, unspecified: Secondary | ICD-10-CM | POA: Diagnosis not present

## 2020-12-12 DIAGNOSIS — I110 Hypertensive heart disease with heart failure: Secondary | ICD-10-CM | POA: Diagnosis not present

## 2020-12-12 DIAGNOSIS — M48062 Spinal stenosis, lumbar region with neurogenic claudication: Secondary | ICD-10-CM | POA: Diagnosis not present

## 2020-12-12 DIAGNOSIS — M5116 Intervertebral disc disorders with radiculopathy, lumbar region: Secondary | ICD-10-CM | POA: Diagnosis not present

## 2020-12-12 DIAGNOSIS — E119 Type 2 diabetes mellitus without complications: Secondary | ICD-10-CM | POA: Diagnosis not present

## 2020-12-12 MED ORDER — OXYCODONE HCL 5 MG PO TABS
ORAL_TABLET | ORAL | Status: AC
  Filled 2020-12-12: qty 1

## 2020-12-12 MED ORDER — IPRATROPIUM-ALBUTEROL 0.5-2.5 (3) MG/3ML IN SOLN
RESPIRATORY_TRACT | Status: AC
  Administered 2020-12-12: 3 mL via RESPIRATORY_TRACT
  Filled 2020-12-12: qty 3

## 2020-12-12 MED ORDER — GABAPENTIN 300 MG PO CAPS
ORAL_CAPSULE | ORAL | Status: AC
  Filled 2020-12-12: qty 2

## 2020-12-12 MED ORDER — FAMOTIDINE 20 MG PO TABS
ORAL_TABLET | ORAL | Status: AC
  Filled 2020-12-12: qty 1

## 2020-12-12 NOTE — NC FL2 (Signed)
[place] LEVEL OF CARE SCREENING TOOL     IDENTIFICATION  Patient Name: Kerri Carter Birthdate: 1940/06/12 Sex: female Admission Date (Current Location): 12/11/2020  [place] and [place] Number:  Engineering geologist and Address:  [facility], [address] [place], [place] [address]      Provider Number: [id number]  Attending Physician Name and Address:  Meade Maw, MD  Relative Name and Phone Number:  Mylee Falin [phone]    Current Level of Care: Hospital Recommended Level of Care: [place] Prior Approval Number:    Date Approved/Denied:   PASRR Number: [id number]  Discharge Plan: SNF    Current Diagnoses: Patient Active Problem List   Diagnosis Date Noted   Lumbar radiculopathy 12/11/2020   COPD exacerbation ([place]) 10/29/2020   Acromial process of scapula fracture 12/21/2019   NSIP (nonspecific interstitial pneumonitis) ([place]) 12/08/2019   Iliopsoas bursitis of right hip 08/29/2019   Chronic left shoulder pain 08/29/2019   Overweight (BMI 25.0-29.9) 08/29/2019   Hypertriglyceridemia 07/24/2019   Bipolar disorder, in full remission, most recent episode depressed ([place]) 07/13/2019   Essential hypertension 06/16/2019   Chronic pain 06/16/2019   Lymphedema 06/05/2019   Chronic venous insufficiency 05/25/2019   PAD (peripheral artery disease) ([place]) 05/25/2019   Leg edema 05/02/2019   Episodic mood disorder ([place]) 84/69/6295   Complicated grief 28/41/3244   At high risk for falls 03/16/2019   Pelvic mass in female 03/16/2019   Compression fracture of L4 vertebra ([place]) 02/21/2019   Collapsed vertebra, not elsewhere classified, lumbar region, initial encounter for fracture ([place]) 02/21/2019   Anxiety 01/20/2019   Pain due to onychomycosis of toenails of both feet 12/05/2018   Hav (hallux abducto valgus), unspecified laterality 12/05/2018   Closed fracture of acromial process of scapula 10/28/2018    Status post reverse arthroplasty of shoulder, left 07/26/2018   Interstitial pulmonary disease ([place]) 07/07/2018   Fatigue 07/07/2018   Avascular necrosis of left shoulder due to adverse effect of steroid therapy ([place]) 01/20/2018   Other shoulder lesions, left shoulder 12/20/2017   Status post reverse total shoulder replacement, right 11/04/2017   Leukocytosis 10/19/2017   Allergic rhinitis 09/28/2017   Rotator cuff tendinitis, right 07/26/2017   Strain of right hip 02/26/2017   History of fracture of left hip 01/15/2017   Status post lumbar spinal fusion 01/15/2017   Dislocation of hip joint prosthesis ([place]) 01/08/2017   Status post hip hemiarthroplasty 01/08/2017   Leg swelling 12/15/2016   Age-related osteoporosis without current pathological fracture 11/05/2016   Hip fracture ([place]) 10/31/2016   Polyneuropathy 10/28/2016   Left foot pain 10/19/2016   Spinal stenosis of lumbar region 09/15/2016   Osteoporosis 08/27/2016   Drug-induced osteoporosis 08/27/2016   Adnexal mass 08/25/2016   Prediabetes 08/25/2016   Coronary atherosclerosis 08/25/2016   History of syncope 08/25/2016   Hypokalemia 08/25/2016   Mixed bipolar I disorder ([place]) 10/09/2015   Stage 3b chronic kidney disease ([place])    Conversion disorder 08/04/2015   Hyperlipidemia 07/17/2015   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) 02/24/2011    Orientation  RESPIRATION BLADDER Height & Weight     Self, Time, Situation, Place  Normal Continent Weight: 78.7 kg Height:  5\' 1"  (154.9 cm)  BEHAVIORAL SYMPTOMS/MOOD NEUROLOGICAL BOWEL NUTRITION STATUS      Continent Diet  AMBULATORY STATUS COMMUNICATION OF NEEDS Skin   Extensive Assist Verbally Normal                       Personal Care Assistance Level of Assistance  Bathing, Dressing Bathing Assistance: Limited assistance   Dressing Assistance: Limited assistance     Functional Limitations Info             SPECIAL CARE FACTORS FREQUENCY  PT (By licensed PT), OT (By licensed OT)     PT Frequency: min 5xweek OT Frequency: min 5xweek            Contractures      Additional Factors Info                  Current Medications (12/12/2020):  This is the current hospital active medication list Current Facility-Administered Medications  Medication Dose Route Frequency Provider Last Rate Last Admin   0.9 %  sodium chloride infusion  250 mL Intravenous Continuous Loleta Dicker, PA 1 mL/hr at 12/11/20 1717 250 mL at 12/11/20 1717   0.9 %  sodium chloride infusion   Intravenous Continuous Loleta Dicker, PA   Stopped at 12/11/20 1650   albuterol (PROVENTIL) (2.5 MG/3ML) 0.083% nebulizer solution 2.5 mg  2.5 mg Nebulization Q6H PRN Meade Maw, MD       ALPRAZolam Duanne Moron) tablet 0.25 mg  0.25 mg Oral QHS PRN Loleta Dicker, PA       amLODipine (NORVASC) tablet 2.5 mg  2.5 mg Oral Daily Loleta Dicker, PA   2.5 mg at 12/12/20 1015   bisacodyl (DULCOLAX) suppository 10 mg  10 mg Rectal Daily PRN Loleta Dicker, PA       budesonide (PULMICORT) nebulizer solution 0.25 mg  0.25 mg Nebulization BID Loleta Dicker, PA   0.25 mg at 12/12/20 0746   cyclobenzaprine (FLEXERIL) tablet 10 mg  10 mg Oral TID PRN Loleta Dicker, PA       dicyclomine (BENTYL) capsule 10 mg  10 mg Oral BID PRN Loleta Dicker, PA       docusate sodium (COLACE) capsule 100  mg  100 mg Oral BID Loleta Dicker, PA       escitalopram (LEXAPRO) tablet 10 mg  10 mg Oral Daily Loleta Dicker, PA   10 mg at 12/12/20 1023   famotidine (PEPCID) tablet 20 mg  20 mg Oral Daily Loleta Dicker, PA   20 mg at 12/12/20 1031   fluticasone (FLONASE) 50 MCG/ACT nasal spray 2 spray  2 spray Each Nare Daily Loleta Dicker, PA   2 spray at 12/12/20 1025   gabapentin (NEURONTIN) capsule 600 mg  600 mg Oral BID Loleta Dicker, PA   600 mg at 12/12/20 1030   HYDROmorphone (DILAUDID) injection 0.5 mg  0.5 mg Intravenous Q2H PRN Loleta Dicker, PA       ipratropium-albuterol (DUONEB) 0.5-2.5 (3) MG/3ML nebulizer solution 3 mL  3 mL Nebulization BID Loleta Dicker, PA   3 mL at 12/12/20 1032   lamoTRIgine (LAMICTAL) tablet 100 mg  100 mg  Oral BID Loleta Dicker, PA   100 mg at 12/12/20 1014   leflunomide (ARAVA) tablet 20 mg  20 mg Oral Daily Loleta Dicker, [place]   20 mg at 12/12/20 1020   menthol-cetylpyridinium (CEPACOL) lozenge 3 mg  1 lozenge Oral PRN Loleta Dicker, PA       Or   phenol (CHLORASEPTIC) mouth spray 1 spray  1 spray Mouth/Throat PRN Loleta Dicker, PA       mirabegron ER [facility]) tablet 50 mg  50 mg Oral Daily Loleta Dicker, [place]   50 mg at 12/12/20 1015   montelukast (SINGULAIR) tablet 10 mg  10 mg Oral Daily Loleta Dicker, [place]   10 mg at 12/12/20 1021   nebivolol (BYSTOLIC) tablet 5 mg  5 mg Oral BID Loleta Dicker, PA   5 mg at 12/12/20 1015   ondansetron (ZOFRAN) tablet 4 mg  4 mg Oral Q6H PRN Loleta Dicker, PA       Or   ondansetron [facility]) injection 4 mg  4 mg Intravenous Q6H PRN Loleta Dicker, PA       oxyCODONE (Oxy IR/ROXICODONE) immediate release tablet 10 mg  10 mg Oral Q3H PRN Loleta Dicker, PA       oxyCODONE (Oxy IR/ROXICODONE) immediate release tablet 5 mg  5 mg Oral Q3H PRN Loleta Dicker, PA   5 mg at 12/12/20 1031   pantoprazole (PROTONIX) EC tablet 40 mg  40 mg Oral BID AC Loleta Dicker, PA   40 mg at 12/12/20 0745   pravastatin (PRAVACHOL) tablet 20 mg  20 mg Oral q1800 Loleta Dicker, PA   20 mg at 12/11/20 1715   QUEtiapine (SEROQUEL) tablet 25 mg  25 mg Oral QHS Loleta Dicker, [place]   25 mg at 12/11/20 2143   senna-docusate (Senokot-S) tablet 1 tablet  1 tablet Oral QHS PRN Loleta Dicker, PA       sodium chloride flush (NS) 0.9 % injection 3 mL  3 mL Intravenous Q12H Loleta Dicker, PA   3 mL at 12/12/20 1030   sodium chloride flush (NS) 0.9 % injection 3 mL  3 mL Intravenous PRN Loleta Dicker, PA   3 mL at 12/11/20 1721   sodium phosphate (FLEET) 7-19 GM/118ML enema 1 enema  1 enema Rectal Once PRN Loleta Dicker, PA       sucralfate (CARAFATE) tablet 1 g  1 g Oral BID Loleta Dicker, PA   1 g at 12/12/20 1022     Discharge Medications: Please see discharge summary for a list of discharge medications.  Relevant Imaging Results:  Relevant Lab Results:   Additional Information SS# [id number]  Anselm Pancoast, RN

## 2020-12-12 NOTE — Progress Notes (Signed)
    Attending Progress Note  History: Kerri Carter is s/p left L2-3 microdiscectomy, L3-4 and L5-S1 decompression   POD1: pt has yet to ambulate but denies any left leg pain today.   Physical Exam: Vitals:   12/12/20 0000 12/12/20 0750  BP: (!) 145/70 114/61  Pulse: 87 88  Resp: 18 17  Temp: 97.7 F (36.5 C) (!) 97.3 F (36.3 C)  SpO2: 94% 96%    AA Ox3 CNI Strength:5/5 throughout BLE  Data:  No results for input(s): NA, K, CL, CO2, BUN, CREATININE, LABGLOM, GLUCOSE, CALCIUM in the last 168 hours. No results for input(s): AST, ALT, ALKPHOS in the last 168 hours.  Invalid input(s): TBILI   No results for input(s): WBC, HGB, HCT, PLT in the last 168 hours. Recent Labs  Lab 12/10/20 0945  APTT 26  INR 0.9         Other tests/results: none   Assessment/Plan:  Kerri Carter is a 80 y.o female s/p eft L2-3 microdiscectomy, L3-4 and L5-S1 decompression for left sided radiculopathy.  - mobilize - pain control - DVT prophylaxis with SCDs - PTOT  Cooper Render PA-C Department of Neurosurgery

## 2020-12-12 NOTE — Care Management Obs Status (Signed)
[place] NOTIFICATION   Patient Details  Name: Kerri Carter MRN: [id number] Date of Birth: [birth year]   Medicare Observation Status Notification Given:  Yes    Anselm Pancoast, RN 12/12/2020, 9:27 AM

## 2020-12-12 NOTE — TOC Progression Note (Signed)
Transition of Care [facility]) - Progression Note    Patient Details  Name: Kerri Carter MRN: [id number] Date of Birth: 01-Sep-1940  Transition of Care [facility]) CM/SW [place], RN Phone Number: 12/12/2020, 3:13 PM  Clinical Narrative:    Spoke to [facility] who confirmed patient is approved once facility selected.   Received call from [place] @ [place] states patient is in copay days and facility will need 14 days of copay upfront. Magda Paganini requested RN CM have family contact her. RN CM reported patient appeared to have 8 skilled days left however Magda Paganini confirmed facility required 14 days upfront regardless.   RN CM outreached to [employer], son and requested he outreach to [place] to clarify needs for admission.    Expected Discharge Plan: [place] Barriers to Discharge: No Barriers Identified  Expected Discharge Plan and Services Expected Discharge Plan: [place] arrangements for the past 2 months: Single Family Home                                       Social Determinants of Health (SDOH) Interventions    Readmission Risk Interventions No flowsheet data found.

## 2020-12-12 NOTE — Care Management CC44 (Signed)
Condition Code 44 Documentation Completed  Patient Details  Name: Kerri Carter MRN: [id number] Date of Birth: 1940/08/16   Condition Code 44 given:  Yes Patient signature on Condition Code 44 notice:  Yes Documentation of 2 MD's agreement:  Yes Code 44 added to claim:  Yes    Anselm Pancoast, RN 12/12/2020, 9:27 AM

## 2020-12-12 NOTE — Care Management Obs Status (Deleted)
[place] NOTIFICATION   Patient Details  Name: LILEY RAKE MRN: [id number] Date of Birth: 1941/01/08   Medicare Observation Status Notification Given:  Yes    Anselm Pancoast, RN 12/12/2020, 8:46 AM

## 2020-12-12 NOTE — Care Management CC44 (Deleted)
Condition Code 44 Documentation Completed  Patient Details  Name: Kerri Carter MRN: [id number] Date of Birth: October 17, 1940   Condition Code 44 given:  Yes Patient signature on Condition Code 44 notice:  Yes Documentation of 2 MD's agreement:  Yes Code 44 added to claim:  Yes    Anselm Pancoast, RN 12/12/2020, 8:46 AM

## 2020-12-12 NOTE — Progress Notes (Signed)
Transferred pt to 154.  Bedside report given to Jun, Therapist, sports.  Safety maintained.

## 2020-12-12 NOTE — Progress Notes (Signed)
Patient's IV pump started bumping. Upon walking into patient's room she is trying to turn off IV. She asked what was beeping. I explained to patient it was just her IV pump saying her bag was empty. Patient stated she did not have an IV. I explained to patient she did have an IV and everything was fine. Patient laying in bed. NAD.

## 2020-12-12 NOTE — TOC Progression Note (Addendum)
Transition of Care [facility]) - Progression Note    Patient Details  Name: Kerri Carter MRN: [id number] Date of Birth: Feb 24, 1940  Transition of Care [facility]) CM/SW [place], RN Phone Number: 12/12/2020, 2:25 PM  Clinical Narrative:    Bed search initiated and sent to requested facilities. Patient preference is [facility]. Contacted Magda Paganini @ [facility] to notify of patient preference. Insurance auth started with pending facility.   Called and updated son, Marlou Sa on SNF bed search. No questions at this time.    Expected Discharge Plan: [place] Barriers to Discharge: No Barriers Identified  Expected Discharge Plan and Services Expected Discharge Plan: [place] arrangements for the past 2 months: Single Family Home                                       Social Determinants of Health (SDOH) Interventions    Readmission Risk Interventions No flowsheet data found.

## 2020-12-12 NOTE — Evaluation (Signed)
Physical Therapy Evaluation Patient Details Name: Kerri Carter MRN: [id number] DOB: May 29, 1940 Today's Date: 12/12/2020  History of Present Illness  Kerri Carter is a 63yoF who comes [facility] on 12/11[facility]20 for elective surgical intervention to address lumbar neurogenic claudication. Pt has been having back pain since a fall last years wherein she hit her back. PMH: L4-5 XLIF/PSF on 09/08/2016 [name]), Rt iliopsoas bursitis, CHF, COPD, DM2, HTN, PND, AI, MR, TR.   Per orders "no brace needed."  Clinical Impression  Pt admitted with above diagnosis. Pt currently with functional limitations due to the deficits listed below (see "PT Problem List"). Upon entry, pt in bed, awake and agreeable to participate. The pt is alert, pleasant, interactive, and able to provide info regarding prior level of function, both in tolerance and independence. Pt has had pain meds just prior to entry. Bed mobility/transfers performed safely but with great effort. Pt attempts 2 AMB trails, neither exceeding 42ft distance, not for pain, but due to severe dyspnea with activity (no hypoxia); pt attributes to coming off her daily prednisone for surgery. Patient's performance this date reveals decreased ability, independence, and tolerance in performing all basic mobility required for performance of activities of daily living. Pt requires additional DME, close physical assistance, and cues for safe participate in mobility. As pt falls frequently with knee buckling PTA, will be alone at home during daytime hours, and is unable AMB >2ft today, STR at DC makes the most sense from a safety and assistance standpoint. Pt will benefit from skilled PT intervention to increase independence and safety with basic mobility in preparation for discharge to the venue listed below.    Marland Kitchenuccorthovitals Orthostatic VS for the past 24 hrs (Last 3 readings):  BP- Lying Pulse- Lying BP- Sitting Pulse- Sitting BP- Standing at 0 minutes Pulse- Standing at 0  minutes  12/12/20 1056 130/58 95 (!) 129/91 101 137/75 107          Recommendations for follow up therapy are one component of a multi-disciplinary discharge planning process, led by the attending physician.  Recommendations may be updated based on patient status, additional functional criteria and insurance authorization.  Follow Up Recommendations Skilled nursing-short term rehab (<3 hours/day) (per pt has 8 skilled days remaining; pt has frequent falls at home and will be home alone daytime at DC)    Assistance Recommended at Discharge Intermittent Supervision/Assistance  Functional Status Assessment Patient has had a recent decline in their functional status and demonstrates the ability to make significant improvements in function in a reasonable and predictable amount of time.  Equipment Recommendations  None recommended by PT    Recommendations for Other Services       Precautions / Restrictions Precautions Precautions: Fall Precaution Comments: back precautions for comfort Restrictions Other Position/Activity Restrictions: no brace needed      Mobility  Bed Mobility Overal bed mobility: Needs Assistance Bed Mobility: Supine to Sit     Supine to sit: Supervision Sit to supine: Supervision   General bed mobility comments: no physical assist provided but pt needing cuing for hand placement    Transfers Overall transfer level: Needs assistance Equipment used: Rolling walker (2 wheels) Transfers: Sit to/from Stand Sit to Stand: Supervision;Min guard Stand pivot transfers: Supervision         General transfer comment: maximal effort, multiple attempts, unable to perform without hands; pulls out IV cannula in Lrft wrist (RN made aware.    Ambulation/Gait   Gait Distance (Feet): 15 Feet Assistive device: Rolling  walker (2 wheels) Gait Pattern/deviations: Step-to pattern       General Gait Details: slow, steady, severe dyspnea limitations prevent farther  distance; 53ft performed twice. Pt reports she had to DC her daily 5mg  prednisone prior to surgery which is why her lung disease and dyspnea are aggravated. No desaturation in session  Stairs            Wheelchair Mobility    Modified Rankin (Stroke Patients Only)       Balance Overall balance assessment: Needs assistance Sitting-balance support: Feet supported;Bilateral upper extremity supported Sitting balance-Leahy Scale: Good     Standing balance support: During functional activity;Reliant on assistive device for balance Standing balance-Leahy Scale: Poor Standing balance comment: can stand with LUE support on RW during RUE BP assessment                             Pertinent Vitals/Pain Pain Assessment: 0-10 Pain Score: 6  Pain Location: back Pain Descriptors / Indicators: Aching;Discomfort;Guarding Pain Intervention(s): Limited activity within patient's tolerance;Monitored during session;Patient requesting pain meds-RN notified;Repositioned    Home Living Family/patient expects to be discharged to:: Private residence Living Arrangements: Children;Other relatives Available Help at Discharge: Family;Available PRN/intermittently Type of Home: House Home Access: Ramped entrance       Home Layout: One level Home Equipment: Conservation officer, nature (2 wheels);Shower seat;Grab bars - toilet;Grab bars - tub/shower;Cane - quad;BSC/3in1 Additional Comments: Pt lives with grandchildren who work during the day.    Prior Function Prior Level of Function : Independent/Modified Independent;History of Falls (last six months)             Mobility Comments: Pt reports use of RW for mobility at baseline. Pt endorses, "Too many to count" , when asked about falls in the last 6 months. ADLs Comments: Pt reports being independent with self care tasks. Her family assists with all the IADL tasks within the home. She does not drive.     Hand Dominance   Dominant Hand:  Right    Extremity/Trunk Assessment   Upper Extremity Assessment Upper Extremity Assessment: Generalized weakness    Lower Extremity Assessment Lower Extremity Assessment: Generalized weakness    Cervical / Trunk Assessment Cervical / Trunk Assessment: Normal  Communication   Communication: No difficulties  Cognition Arousal/Alertness: Awake/alert Behavior During Therapy: WFL for tasks assessed/performed Overall Cognitive Status: Within Functional Limits for tasks assessed                                 General Comments: Pt is pleasant and cooperative overall        General Comments      Exercises     Assessment/Plan    PT Assessment Patient needs continued PT services  PT Problem List Decreased strength;Decreased range of motion;Decreased activity tolerance;Decreased balance;Decreased safety awareness;Decreased mobility;Decreased coordination;Decreased cognition;Cardiopulmonary status limiting activity       PT Treatment Interventions Balance training;DME instruction;Gait training    PT Goals (Current goals can be found in the Care Plan section)  Acute Rehab PT Goals Patient Stated Goal: regain PLOF in household AMB tolerance PT Goal Formulation: With patient Time For Goal Achievement: 12/26/20 Potential to Achieve Goals: Good    Frequency 7X/week   Barriers to discharge Decreased caregiver support will be home alone at DC    Co-evaluation  AM-PAC PT "6 Clicks" Mobility  Outcome Measure Help needed turning from your back to your side while in a flat bed without using bedrails?: A Little Help needed moving from lying on your back to sitting on the side of a flat bed without using bedrails?: A Little Help needed moving to and from a bed to a chair (including a wheelchair)?: A Little Help needed standing up from a chair using your arms (e.g., wheelchair or bedside chair)?: A Little Help needed to walk in hospital room?: A  Lot Help needed climbing 3-5 steps with a railing? : A Lot 6 Click Score: 16    End of Session Equipment Utilized During Treatment: Gait belt Activity Tolerance: No increased pain;Treatment limited secondary to medical complications (Comment) (severe dyspnea and mild-moderate anxiety) Patient left: in chair;with call bell/phone within reach (RN outside of bay in line of sight of patient) Nurse Communication: Mobility status PT Visit Diagnosis: Unsteadiness on feet (R26.81);Difficulty in walking, not elsewhere classified (R26.2);Other abnormalities of gait and mobility (R26.89);Repeated falls (R29.6);Dizziness and giddiness (R42)    Time: 8088-1103 PT Time Calculation (min) (ACUTE ONLY): 37 min   Charges:   PT Evaluation $PT Eval Moderate Complexity: 1 Mod PT Treatments $Therapeutic Activity: 8-22 mins       12:19 PM, 12/12/20 [name], PT, DPT Physical Therapist - [facility]  [phone] ([place])    Advance C 12/12/2020, 12:15 PM

## 2020-12-12 NOTE — Evaluation (Signed)
Occupational Therapy Evaluation Patient Details Name: Kerri Carter MRN: [id number] DOB: 1941/01/07 Today's Date: 12/12/2020   History of Present Illness Kerri Carter is89 y/o female  s/p left L2-3 microdiscectomy, L3-4 and L5-S1 decompression.   Clinical Impression   Patient presenting with decreased Ind in self care, balance, functional mobility/transfers, endurance, and safety awareness. Patient reports being mod I at baseline with use of RW for mobility. Her grandson, his wife, and their child live with her but they work during the day. Patient currently functioning at supervision level with use of AD for functional tasks and mobility. OT did recommend that during the day she keep 3 in 1 near chair or bedside when family is at work for safety with toileting needs. She would benefit from [facility] to address functional deficits further and home safety evaluation as pt reports frequent falls prior to surgery.  Patient will benefit from acute OT to increase overall independence in the areas of ADLs, functional mobility, and safety awareness in order to safely discharge home with family.      Recommendations for follow up therapy are one component of a multi-disciplinary discharge planning process, led by the attending physician.  Recommendations may be updated based on patient status, additional functional criteria and insurance authorization.   Follow Up Recommendations  Home health OT    Assistance Recommended at Discharge Intermittent Supervision/Assistance  Functional Status Assessment  Patient has had a recent decline in their functional status and demonstrates the ability to make significant improvements in function in a reasonable and predictable amount of time.  Equipment Recommendations  None recommended by OT       Precautions / Restrictions Precautions Precautions: Fall Precaution Comments: back precautions for comfort Restrictions Other Position/Activity Restrictions: no brace  needed      Mobility Bed Mobility Overal bed mobility: Needs Assistance Bed Mobility: Supine to Sit;Sit to Supine     Supine to sit: Supervision Sit to supine: Supervision   General bed mobility comments: no physical assist provided but pt needing cuing for hand placement    Transfers Overall transfer level: Needs assistance Equipment used: Rolling walker (2 wheels) Transfers: Sit to/from Stand;Bed to chair/wheelchair/BSC Sit to Stand: Supervision Stand pivot transfers: Supervision                Balance Overall balance assessment: Needs assistance Sitting-balance support: Feet supported;Bilateral upper extremity supported Sitting balance-Leahy Scale: Good     Standing balance support: During functional activity;Reliant on assistive device for balance Standing balance-Leahy Scale: Fair                             ADL either performed or assessed with clinical judgement   ADL Overall ADL's : Needs assistance/impaired                                     Functional mobility during ADLs: Supervision/safety;Rolling walker (2 wheels) General ADL Comments: supervision overall for bed mobility, functional transfer, and simulated toileting tasks. Pt demonstrates figure four position but with increased time and some difficulty noted.     Vision Patient Visual Report: No change from baseline              Pertinent Vitals/Pain Pain Assessment: 0-10 Pain Score: 6  Pain Location: back Pain Descriptors / Indicators: Aching;Discomfort;Guarding Pain Intervention(s): Limited activity within patient's tolerance;Monitored during session;Patient requesting pain  meds-RN notified;Repositioned     Hand Dominance Right   Extremity/Trunk Assessment Upper Extremity Assessment Upper Extremity Assessment: Overall WFL for tasks assessed   Lower Extremity Assessment Lower Extremity Assessment: Defer to PT evaluation       Communication  Communication Communication: No difficulties   Cognition Arousal/Alertness: Awake/alert Behavior During Therapy: WFL for tasks assessed/performed Overall Cognitive Status: Within Functional Limits for tasks assessed                                 General Comments: Pt is pleasant and cooperative overall                Home Living Family/patient expects to be discharged to:: Private residence Living Arrangements: Children;Other relatives Available Help at Discharge: Family;Available PRN/intermittently Type of Home: House Home Access: Ramped entrance     Home Layout: One level     Bathroom Shower/Tub: Walk-in shower         Home Equipment: Conservation officer, nature (2 wheels);Shower seat;Grab bars - toilet;Grab bars - tub/shower;Cane - quad;BSC/3in1   Additional Comments: Pt lives with grandchildren who work during the day.      Prior Functioning/Environment Prior Level of Function : Independent/Modified Independent;History of Falls (last six months)             Mobility Comments: Pt reports use of RW for mobility at baseline. Pt endorses, "Too many to count" , when asked about falls in the last 6 months. ADLs Comments: Pt reports being independent with self care tasks. Her family assists with all the IADL tasks within the home. She does not drive.        OT Problem List: Decreased strength;Decreased activity tolerance;Impaired balance (sitting and/or standing);Decreased safety awareness;Pain      OT Treatment/Interventions: Self-care/ADL training;Therapeutic exercise;Therapeutic activities;Energy conservation;DME and/or AE instruction;Patient/family education;Balance training    OT Goals(Current goals can be found in the care plan section) Acute Rehab OT Goals Patient Stated Goal: to return home OT Goal Formulation: With patient Time For Goal Achievement: 12/26/20 Potential to Achieve Goals: Good ADL Goals Pt Will Perform Grooming: with modified  independence;standing Pt Will Perform Lower Body Dressing: with modified independence;sit to/from stand Pt Will Transfer to Toilet: with modified independence;ambulating Pt Will Perform Toileting - Clothing Manipulation and hygiene: with modified independence;sit to/from stand  OT Frequency: Min 2X/week   Barriers to D/C:    at home during the day by herself          AM-PAC OT "6 Clicks" Daily Activity     Outcome Measure Help from another person eating meals?: None Help from another person taking care of personal grooming?: None Help from another person toileting, which includes using toliet, bedpan, or urinal?: A Little Help from another person bathing (including washing, rinsing, drying)?: A Little Help from another person to put on and taking off regular upper body clothing?: None Help from another person to put on and taking off regular lower body clothing?: A Little 6 Click Score: 21   End of Session Equipment Utilized During Treatment: Rolling walker (2 wheels) Nurse Communication: Mobility status;Patient requests pain meds  Activity Tolerance: Patient tolerated treatment well Patient left: in bed;with call bell/phone within reach;with bed alarm set  OT Visit Diagnosis: Unsteadiness on feet (R26.81);Repeated falls (R29.6);Muscle weakness (generalized) (M62.81);History of falling (Z91.81);Pain Pain - part of body:  (back)  Time: 0937-1000 OT Time Calculation (min): 23 min Charges:  OT General Charges $OT Visit: 1 Visit OT Evaluation $OT Eval Low Complexity: 1 Low OT Treatments $Self Care/Home Management : 8-22 mins  Darleen Crocker, MS, OTR/L , CBIS ascom [phone]  12/12/20, 10:26 AM

## 2020-12-12 NOTE — TOC Initial Note (Signed)
Transition of Care [facility]) - Initial/Assessment Note    Patient Details  Name: Kerri Carter MRN: [id number] Date of Birth: 1941-01-01  [facility]) CM/SW Contact:    Anselm Pancoast, RN Phone Number: 12/12/2020, 11:52 AM  Clinical Narrative:                 Spoke with granddaughter, Morey Hummingbird who is primary caregiver regarding discharge needs and Code 44/OBS status. Morey Hummingbird understands OBS status and reports her and her husband work 8-10 hours daily and want to be sure patient is safe to return home with hhc and does not need SNF. RN CM discussed current OT recc of [place] and pending PT eval. Discussed SNF process and need for insurance approval. Morey Hummingbird understands and reports she just worries about patient falling after surgery however family will do what is best for patient. TOC will assist once PT eval completed as needed. No DME needed as patient reports having needed equipment.   Expected Discharge Plan: [place] Barriers to Discharge: No Barriers Identified   Patient Goals and CMS Choice Patient states their goals for this hospitalization and ongoing recovery are:: Become more mobile and less pain      Expected Discharge Plan and Services Expected Discharge Plan: [place]       Living arrangements for the past 2 months: Single Family Home                                      Prior Living Arrangements/Services Living arrangements for the past 2 months: Single Family Home Lives with:: Relatives (Lives with granddaughter/grandson) Patient language and need for interpreter reviewed:: Yes Do you feel safe going back to the place where you live?: Yes      Need for Family Participation in Patient Care: Yes (Comment) Care giver support system in place?: Yes (comment) Current home services: DME (Cane ,walker, wc, electric scooter) Criminal Activity/Legal Involvement Pertinent to Current Situation/Hospitalization: No - Comment as  needed  Activities of Daily Living Home Assistive Devices/Equipment: Environmental consultant (specify type), Nebulizer, Oxygen, Cane (specify quad or straight), Dentures (specify type), Grab bars around toilet, Grab bars in shower, Shower chair with back, Raised toilet seat with rails ADL Screening (condition at time of admission) Patient's cognitive ability adequate to safely complete daily activities?: Yes Is the patient deaf or have difficulty hearing?: No Does the patient have difficulty seeing, even when wearing glasses/contacts?: No Does the patient have difficulty concentrating, remembering, or making decisions?: Yes (remembering things) Patient able to express need for assistance with ADLs?: Yes Does the patient have difficulty dressing or bathing?: No Independently performs ADLs?: Yes (appropriate for developmental age) Does the patient have difficulty walking or climbing stairs?: Yes Weakness of Legs: Both Weakness of Arms/Hands: None  Permission Sought/Granted Permission sought to share information with : Facility Sport and exercise psychologist, Family Supports Permission granted to share information with : Yes, Verbal Permission Granted  Share Information with NAME: Son and granddaughter-[place]           Emotional Assessment Appearance:: Appears stated age Attitude/Demeanor/Rapport: Engaged Affect (typically observed): Appropriate Orientation: : Oriented to Self, Oriented to Place, Oriented to  Time, Oriented to Situation Alcohol / Substance Use: Not Applicable Psych Involvement: No (comment)  Admission diagnosis:  Lumbar radiculopathy [M54.16] Patient Active Problem List   Diagnosis Date Noted   Lumbar radiculopathy 12/11/2020   COPD  exacerbation ([place]) 10/29/2020   Acromial process of scapula fracture 12/21/2019   NSIP (nonspecific interstitial pneumonitis) ([place]) 12/08/2019   Iliopsoas bursitis of right hip 08/29/2019   Chronic left shoulder pain 08/29/2019   Overweight (BMI 25.0-29.9)  08/29/2019   Hypertriglyceridemia 07/24/2019   Bipolar disorder, in full remission, most recent episode depressed ([place]) 07/13/2019   Essential hypertension 06/16/2019   Chronic pain 06/16/2019   Lymphedema 06/05/2019   Chronic venous insufficiency 05/25/2019   PAD (peripheral artery disease) ([place]) 05/25/2019   Leg edema 05/02/2019   Episodic mood disorder ([place]) 91/63/8466   Complicated grief 59/93/5701   At high risk for falls 03/16/2019   Pelvic mass in female 03/16/2019   Compression fracture of L4 vertebra ([place]) 02/21/2019   Collapsed vertebra, not elsewhere classified, lumbar region, initial encounter for fracture ([place]) 02/21/2019   Anxiety 01/20/2019   Pain due to onychomycosis of toenails of both feet 12/05/2018   Hav (hallux abducto valgus), unspecified laterality 12/05/2018   Closed fracture of acromial process of scapula 10/28/2018   Status post reverse arthroplasty of shoulder, left 07/26/2018   Interstitial pulmonary disease ([place]) 07/07/2018   Fatigue 07/07/2018   Avascular necrosis of left shoulder due to adverse effect of steroid therapy ([place]) 01/20/2018   Other shoulder lesions, left shoulder 12/20/2017   Status post reverse total shoulder replacement, right 11/04/2017   Leukocytosis 10/19/2017   Allergic rhinitis 09/28/2017   Rotator cuff tendinitis, right 07/26/2017   Strain of right hip 02/26/2017   History of fracture of left hip 01/15/2017   Status post lumbar spinal fusion 01/15/2017   Dislocation of hip joint prosthesis ([place]) 01/08/2017   Status post hip hemiarthroplasty 01/08/2017   Leg swelling 12/15/2016   Age-related osteoporosis without current pathological fracture 11/05/2016   Hip fracture ([place]) 10/31/2016   Polyneuropathy 10/28/2016   Left foot pain 10/19/2016   Spinal stenosis of lumbar region 09/15/2016   Osteoporosis 08/27/2016   Drug-induced osteoporosis 08/27/2016   Adnexal mass 08/25/2016   Prediabetes 08/25/2016   Coronary atherosclerosis  08/25/2016   History of syncope 08/25/2016   Hypokalemia 08/25/2016   Mixed bipolar I disorder ([place]) 10/09/2015   Stage 3b chronic kidney disease ([place])    Conversion disorder 08/04/2015   Hyperlipidemia 07/17/2015   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   Macular degeneration 07/17/2015   Insomnia 07/17/2015   Rheumatoid arthritis ([place]) 12/05/2014   Other specified postprocedural states 07/20/2013   Anxiety and depression 06/13/2013   Osteoarthritis of knee 06/07/2013   Cough 05/02/2012   Valvular heart disease 10/13/2011   Asthma-COPD overlap syndrome ([place]) 09/09/2011   OSA (obstructive sleep apnea) 09/09/2011   Nocturnal hypoxemia 09/09/2011   Gastroesophageal reflux disease 02/24/2011   Single kidney 02/24/2011   H/O cardiac catheterization 02/24/2011   Renal artery stenosis ([place]) 02/24/2011   PCP:  McLean-Scocuzza, Nino Glow, MD Pharmacy:   [id number] [place], [place] - [address] [place] [address] Phone: [phone] Fax: [phone]  [place], [place] [place] [place] [address] Phone: [phone] Fax: [phone]  [id number] [place], [place] [address] Phone: [phone] Fax: [phone]  [id number] [place], [place] [address] [place] [address] Phone: [phone] Fax: [phone]  Social Determinants of Health (SDOH) Interventions    Readmission Risk Interventions No flowsheet data found.   

## 2020-12-12 NOTE — TOC Initial Note (Addendum)
Transition of Care [facility]) - Initial/Assessment Note    Patient Details  Name: Kerri Carter MRN: [id number] Date of Birth: 1940-06-29  Transition of Care [facility]) CM/SW Contact:    Anselm Pancoast, RN Phone Number: 12/12/2020, 10:43 AM  Clinical Narrative:                 LVMM for [place],Dean (Son) @ [phone] to discuss dc plans.   [place],Carrie Granddaughter [phone]          Patient Goals and CMS Choice        Expected Discharge Plan and Services                                                Prior Living Arrangements/Services                       Activities of Daily Living Home Assistive Devices/Equipment: Environmental consultant (specify type), Nebulizer, Oxygen, Cane (specify quad or straight), Dentures (specify type), Grab bars around toilet, Grab bars in shower, Shower chair with back, Raised toilet seat with rails ADL Screening (condition at time of admission) Patient's cognitive ability adequate to safely complete daily activities?: Yes Is the patient deaf or have difficulty hearing?: No Does the patient have difficulty seeing, even when wearing glasses/contacts?: No Does the patient have difficulty concentrating, remembering, or making decisions?: Yes (remembering things) Patient able to express need for assistance with ADLs?: Yes Does the patient have difficulty dressing or bathing?: No Independently performs ADLs?: Yes (appropriate for developmental age) Does the patient have difficulty walking or climbing stairs?: Yes Weakness of Legs: Both Weakness of Arms/Hands: None  Permission Sought/Granted                  Emotional Assessment              Admission diagnosis:  Lumbar radiculopathy [M54.16] Patient Active Problem List   Diagnosis Date Noted   Lumbar radiculopathy 12/11/2020   COPD exacerbation ([place]) 10/29/2020   Acromial process of scapula fracture 12/21/2019   NSIP (nonspecific interstitial pneumonitis) ([place]) 12/08/2019    Iliopsoas bursitis of right hip 08/29/2019   Chronic left shoulder pain 08/29/2019   Overweight (BMI 25.0-29.9) 08/29/2019   Hypertriglyceridemia 07/24/2019   Bipolar disorder, in full remission, most recent episode depressed ([place]) 07/13/2019   Essential hypertension 06/16/2019   Chronic pain 06/16/2019   Lymphedema 06/05/2019   Chronic venous insufficiency 05/25/2019   PAD (peripheral artery disease) ([place]) 05/25/2019   Leg edema 05/02/2019   Episodic mood disorder ([place]) 08/67/6195   Complicated grief 09/32/6712   At high risk for falls 03/16/2019   Pelvic mass in female 03/16/2019   Compression fracture of L4 vertebra ([place]) 02/21/2019   Collapsed vertebra, not elsewhere classified, lumbar region, initial encounter for fracture ([place]) 02/21/2019   Anxiety 01/20/2019   Pain due to onychomycosis of toenails of both feet 12/05/2018   Hav (hallux abducto valgus), unspecified laterality 12/05/2018   Closed fracture of acromial process of scapula 10/28/2018   Status post reverse arthroplasty of shoulder, left 07/26/2018   Interstitial pulmonary disease ([place]) 07/07/2018   Fatigue 07/07/2018   Avascular necrosis of left shoulder due to adverse effect of steroid therapy ([place]) 01/20/2018   Other shoulder lesions, left shoulder 12/20/2017   Status post reverse total shoulder replacement, right 11/04/2017   Leukocytosis 10/19/2017  Allergic rhinitis 09/28/2017   Rotator cuff tendinitis, right 07/26/2017   Strain of right hip 02/26/2017   History of fracture of left hip 01/15/2017   Status post lumbar spinal fusion 01/15/2017   Dislocation of hip joint prosthesis ([place]) 01/08/2017   Status post hip hemiarthroplasty 01/08/2017   Leg swelling 12/15/2016   Age-related osteoporosis without current pathological fracture 11/05/2016   Hip fracture ([place]) 10/31/2016   Polyneuropathy 10/28/2016   Left foot pain 10/19/2016   Spinal stenosis of lumbar region 09/15/2016   Osteoporosis 08/27/2016    Drug-induced osteoporosis 08/27/2016   Adnexal mass 08/25/2016   Prediabetes 08/25/2016   Coronary atherosclerosis 08/25/2016   History of syncope 08/25/2016   Hypokalemia 08/25/2016   Mixed bipolar I disorder ([place]) 10/09/2015   Stage 3b chronic kidney disease ([place])    Conversion disorder 08/04/2015   Hyperlipidemia 07/17/2015   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   Macular degeneration 07/17/2015   Insomnia 07/17/2015   Rheumatoid arthritis ([place]) 12/05/2014   Other specified postprocedural states 07/20/2013   Anxiety and depression 06/13/2013   Osteoarthritis of knee 06/07/2013   Cough 05/02/2012   Valvular heart disease 10/13/2011   Asthma-COPD overlap syndrome ([place]) 09/09/2011   OSA (obstructive sleep apnea) 09/09/2011   Nocturnal hypoxemia 09/09/2011   Gastroesophageal reflux disease 02/24/2011   Single kidney 02/24/2011   H/O cardiac catheterization 02/24/2011   Renal artery stenosis ([place]) 02/24/2011   PCP:  McLean-Scocuzza, Nino Glow, MD Pharmacy:   [id number] [place], [place] - [address] [place] [address] Phone: [phone] Fax: [phone]  [place], [place]. [place] [place] [address] Phone: [phone] Fax: [phone]  [id number] [place], [place] [address] Phone: [phone] Fax: [phone]  [id number] [place], [place] [address] [place] [address] Phone: [phone] Fax: [phone]     Social Determinants of Health (SDOH) Interventions    Readmission Risk Interventions No flowsheet data found.

## 2020-12-13 DIAGNOSIS — J45998 Other asthma: Secondary | ICD-10-CM | POA: Diagnosis not present

## 2020-12-13 DIAGNOSIS — F3176 Bipolar disorder, in full remission, most recent episode depressed: Secondary | ICD-10-CM | POA: Diagnosis not present

## 2020-12-13 DIAGNOSIS — Z743 Need for continuous supervision: Secondary | ICD-10-CM | POA: Diagnosis not present

## 2020-12-13 DIAGNOSIS — J449 Chronic obstructive pulmonary disease, unspecified: Secondary | ICD-10-CM | POA: Diagnosis not present

## 2020-12-13 DIAGNOSIS — Z7952 Long term (current) use of systemic steroids: Secondary | ICD-10-CM | POA: Diagnosis not present

## 2020-12-13 DIAGNOSIS — M48062 Spinal stenosis, lumbar region with neurogenic claudication: Secondary | ICD-10-CM | POA: Diagnosis not present

## 2020-12-13 DIAGNOSIS — M0579 Rheumatoid arthritis with rheumatoid factor of multiple sites without organ or systems involvement: Secondary | ICD-10-CM | POA: Diagnosis not present

## 2020-12-13 DIAGNOSIS — M5116 Intervertebral disc disorders with radiculopathy, lumbar region: Secondary | ICD-10-CM | POA: Diagnosis not present

## 2020-12-13 DIAGNOSIS — M810 Age-related osteoporosis without current pathological fracture: Secondary | ICD-10-CM | POA: Diagnosis not present

## 2020-12-13 DIAGNOSIS — Z9981 Dependence on supplemental oxygen: Secondary | ICD-10-CM | POA: Diagnosis not present

## 2020-12-13 DIAGNOSIS — M4326 Fusion of spine, lumbar region: Secondary | ICD-10-CM | POA: Diagnosis not present

## 2020-12-13 DIAGNOSIS — J849 Interstitial pulmonary disease, unspecified: Secondary | ICD-10-CM | POA: Diagnosis not present

## 2020-12-13 DIAGNOSIS — Z87891 Personal history of nicotine dependence: Secondary | ICD-10-CM | POA: Diagnosis not present

## 2020-12-13 DIAGNOSIS — Z8781 Personal history of (healed) traumatic fracture: Secondary | ICD-10-CM | POA: Diagnosis not present

## 2020-12-13 DIAGNOSIS — M5416 Radiculopathy, lumbar region: Secondary | ICD-10-CM | POA: Diagnosis not present

## 2020-12-13 DIAGNOSIS — R279 Unspecified lack of coordination: Secondary | ICD-10-CM | POA: Diagnosis not present

## 2020-12-13 DIAGNOSIS — Z48811 Encounter for surgical aftercare following surgery on the nervous system: Secondary | ICD-10-CM | POA: Diagnosis not present

## 2020-12-13 DIAGNOSIS — N1832 Chronic kidney disease, stage 3b: Secondary | ICD-10-CM | POA: Diagnosis not present

## 2020-12-13 MED ORDER — SENNOSIDES-DOCUSATE SODIUM 8.6-50 MG PO TABS
1.0000 | ORAL_TABLET | Freq: Every evening | ORAL | 0 refills | Status: DC | PRN
Start: 2020-12-13 — End: 2021-01-13

## 2020-12-13 MED ORDER — OXYCODONE HCL 5 MG PO TABS: 5.0000 mg | ORAL_TABLET | ORAL | 0 refills | Status: AC | PRN

## 2020-12-13 MED ORDER — ALPRAZOLAM 0.25 MG PO TABS: ORAL_TABLET | ORAL | 0 refills | Status: DC

## 2020-12-13 MED ORDER — ENOXAPARIN SODIUM 40 MG/0.4ML IJ SOSY
40.0000 mg | PREFILLED_SYRINGE | INTRAMUSCULAR | Status: DC
  Filled 2020-12-13: qty 0.4

## 2020-12-13 NOTE — Progress Notes (Signed)
Attempted to call report to [facility] twice. Nurse stated she would call me back. Pt IV removed and she is pending EMS arrival.

## 2020-12-13 NOTE — TOC Progression Note (Signed)
Transition of Care [facility]) - Progression Note    Patient Details  Name: Kerri Carter MRN: [id number] Date of Birth: Sep 02, 1940  Transition of Care [facility]) CM/SW [place], RN Phone Number: 12/13/2020, 1:55 PM  Clinical Narrative:     Called EMS to transport, she is the 4th on the list, I notified the bedside nurse  Expected Discharge Plan: [place] Barriers to Discharge: No Barriers Identified  Expected Discharge Plan and Services Expected Discharge Plan: [place] arrangements for the past 2 months: Single Family Home Expected Discharge Date: 12/12/20                                     Social Determinants of Health (SDOH) Interventions    Readmission Risk Interventions No flowsheet data found.

## 2020-12-13 NOTE — Progress Notes (Signed)
Physical Therapy Treatment Patient Details Name: Kerri Carter MRN: [id number] DOB: 01-10-1941 Today's Date: 12/13/2020   History of Present Illness Kerri Carter is a 18yoF who comes to [facility] on 12/11/20 for elective surgical intervention to address lumbar neurogenic claudication. Pt has been having back pain since a fall last years wherein she hit her back. PMH: L4-5 XLIF/PSF on 09/08/2016 Kerri Carter), Rt iliopsoas bursitis, CHF, COPD, DM2, HTN, PND, AI, MR, TR.   Per orders "no brace needed."    PT Comments    Pt in bed on entry,agreeable to session, reports pain meds received and she has been AMB to BR with NSG this date. Pt able to advance AMB distance to >94ft, but still very limited, unable to push through pain and make it to doorway before turnaround. Pt not limited by dyspnea this session. Pt requiring modA for bed mobility, elevated surface for STS. Will continue to follow.     Recommendations for follow up therapy are one component of a multi-disciplinary discharge planning process, led by the attending physician.  Recommendations may be updated based on patient status, additional functional criteria and insurance authorization.  Follow Up Recommendations  Skilled nursing-short term rehab (<3 hours/day)     Assistance Recommended at Discharge Intermittent Supervision/Assistance  Equipment Recommendations  None recommended by PT    Recommendations for Other Services       Precautions / Restrictions Precautions Precautions: Fall Precaution Comments: back precautions for comfort Restrictions Weight Bearing Restrictions: No Other Position/Activity Restrictions: no brace needed     Mobility  Bed Mobility   Bed Mobility: Supine to Sit;Sit to Supine     Supine to sit: Mod assist Sit to supine: Mod assist   General bed mobility comments: more pain limited today than yesterday    Transfers Overall transfer level: Needs assistance   Transfers: Sit to/from Stand Sit to  Stand: Supervision;From elevated surface                Ambulation/Gait Ambulation/Gait assistance: Min guard;Supervision Gait Distance (Feet): 30 Feet Assistive device: Rolling walker (2 wheels) Gait Pattern/deviations: Step-to pattern       General Gait Details: slow, interupted adn distracted, but dyspnea not the limitation today so much as her CC bilat groin and back pain. Pt had received anagesia ~20 minutes prior to entry, but pt unable to push through the pain and make it to the door.   Stairs             Wheelchair Mobility    Modified Rankin (Stroke Patients Only)       Balance                                            Cognition                                                Exercises      General Comments        Pertinent Vitals/Pain Pain Assessment: 0-10 Pain Location: bilat groin Pain Descriptors / Indicators: Aching;Discomfort;Guarding Pain Intervention(s): Limited activity within patient's tolerance;Monitored during session    Home Living  Prior Function            PT Goals (current goals can now be found in the care plan section) Acute Rehab PT Goals Patient Stated Goal: regain PLOF in household AMB tolerance PT Goal Formulation: With patient Time For Goal Achievement: 12/26/20 Potential to Achieve Goals: Good Progress towards PT goals: Progressing toward goals    Frequency    7X/week      PT Plan Current plan remains appropriate    Co-evaluation              AM-PAC PT "6 Clicks" Mobility   Outcome Measure  Help needed turning from your back to your side while in a flat bed without using bedrails?: A Little Help needed moving from lying on your back to sitting on the side of a flat bed without using bedrails?: A Little Help needed moving to and from a bed to a chair (including a wheelchair)?: A Little Help needed standing up from a  chair using your arms (e.g., wheelchair or bedside chair)?: A Little Help needed to walk in hospital room?: A Lot Help needed climbing 3-5 steps with a railing? : A Lot 6 Click Score: 16    End of Session Equipment Utilized During Treatment: Gait belt Activity Tolerance: Patient limited by pain Patient left: in chair;with call bell/phone within reach Nurse Communication: Mobility status PT Visit Diagnosis: Unsteadiness on feet (R26.81);Difficulty in walking, not elsewhere classified (R26.2);Other abnormalities of gait and mobility (R26.89);Repeated falls (R29.6);Dizziness and giddiness (R42)     Time: 1005-1020 PT Time Calculation (min) (ACUTE ONLY): 15 min  Charges:  $Therapeutic Exercise: 8-22 mins                    11:29 AM, 12/13/20 Kerri Carter, PT, DPT Physical Therapist - [facility]  [phone] ([place])     Kerri Carter C 12/13/2020, 11:26 AM

## 2020-12-13 NOTE — TOC Progression Note (Signed)
Transition of Care [facility]) - Progression Note    Patient Details  Name: Kerri Carter MRN: [id number] Date of Birth: 04-Apr-1940  Transition of Care [facility]) CM/SW [place], RN Phone Number: 12/13/2020, 8:32 AM  Clinical Narrative:   Damaris Schooner with Marlou Sa the patient's son on the phone, verified that the plan is for the patient to go to [facility] [place], [employer] with Magda Paganini at [facility], she agreed that the patient is able to come today, Received notification from [facility] that the auth was approved Auth ID number [id number] 11/11 start date, next review date 11/15, Provided Magda Paganini with this information, I provided [employer] with the name and NPI of      Expected Discharge Plan: [place] Barriers to Discharge: No Barriers Identified  Expected Discharge Plan and Services Expected Discharge Plan: [place] arrangements for the past 2 months: Single Family Home                                       Social Determinants of Health (SDOH) Interventions    Readmission Risk Interventions No flowsheet data found.

## 2020-12-13 NOTE — Progress Notes (Signed)
    Attending Progress Note  History: LYZBETH GENRICH is s/p left L2-3 microdiscectomy, L3-4 and L5-S1 decompression   POD2: NAEO POD1: pt has yet to ambulate but denies any left leg pain today.   Physical Exam: Vitals:   12/12/20 2332 12/13/20 0433  BP: (!) 160/72 (!) 167/71  Pulse: 91 89  Resp: 20 20  Temp: 98.4 F (36.9 C) 97.8 F (36.6 C)  SpO2: 97% 97%    AA Ox3 CNI Strength:5/5 throughout BLE  Data:  No results for input(s): NA, K, CL, CO2, BUN, CREATININE, LABGLOM, GLUCOSE, CALCIUM in the last 168 hours. No results for input(s): AST, ALT, ALKPHOS in the last 168 hours.  Invalid input(s): TBILI   No results for input(s): WBC, HGB, HCT, PLT in the last 168 hours. Recent Labs  Lab 12/10/20 0945  APTT 26  INR 0.9          Other tests/results: none   Assessment/Plan:  SHAILYNN FONG is a 80 y.o female s/p eft L2-3 microdiscectomy, L3-4 and L5-S1 decompression for left sided radiculopathy.  - mobilize - pain control - DVT prophylaxis  - PTOT - dispo planning underway  Cooper Render PA-C Department of Neurosurgery

## 2020-12-13 NOTE — Plan of Care (Signed)
No acute events during the night. PRN oxycodone during the night. VSS. Dressing to back is dry and intact. 2L o2 worn during the night.  Problem: Education: Goal: Knowledge of General Education information will improve Description: Including pain rating scale, medication(s)/side effects and non-pharmacologic comfort measures Outcome: Progressing   Problem: Health Behavior/Discharge Planning: Goal: Ability to manage health-related needs will improve Outcome: Progressing   Problem: Clinical Measurements: Goal: Ability to maintain clinical measurements within normal limits will improve Outcome: Progressing Goal: Will remain free from infection Outcome: Progressing Goal: Diagnostic test results will improve Outcome: Progressing Goal: Respiratory complications will improve Outcome: Progressing Goal: Cardiovascular complication will be avoided Outcome: Progressing   Problem: Activity: Goal: Risk for activity intolerance will decrease Outcome: Progressing   Problem: Nutrition: Goal: Adequate nutrition will be maintained Outcome: Progressing   Problem: Coping: Goal: Level of anxiety will decrease Outcome: Progressing   Problem: Elimination: Goal: Will not experience complications related to bowel motility Outcome: Progressing Goal: Will not experience complications related to urinary retention Outcome: Progressing   Problem: Pain Managment: Goal: General experience of comfort will improve Outcome: Progressing   Problem: Safety: Goal: Ability to remain free from injury will improve Outcome: Progressing   Problem: Skin Integrity: Goal: Risk for impaired skin integrity will decrease Outcome: Progressing

## 2020-12-16 ENCOUNTER — Telehealth: Payer: Self-pay

## 2020-12-16 DIAGNOSIS — F3176 Bipolar disorder, in full remission, most recent episode depressed: Secondary | ICD-10-CM | POA: Diagnosis not present

## 2020-12-16 DIAGNOSIS — M0579 Rheumatoid arthritis with rheumatoid factor of multiple sites without organ or systems involvement: Secondary | ICD-10-CM | POA: Diagnosis not present

## 2020-12-16 DIAGNOSIS — M48062 Spinal stenosis, lumbar region with neurogenic claudication: Secondary | ICD-10-CM | POA: Diagnosis not present

## 2020-12-16 NOTE — Telephone Encounter (Signed)
Transition Care Management Unsuccessful Follow-up Telephone Call  Date of discharge and from where:  12/13/2020 / [facility]  Attempts:  1st Attempt  Reason for unsuccessful TCM follow-up call:  Unable to leave message.  Message stated mailbox was full.  Quinn Plowman RN,BSN,CCM RN Case Manager [place]  [phone]

## 2020-12-17 NOTE — Telephone Encounter (Signed)
Please advise, This has been received via fax. Has note been updated?

## 2020-12-17 NOTE — Telephone Encounter (Signed)
Patient called and stated that Kerri Carter has not received paper work for wheelchair or hospital bed.

## 2020-12-18 DIAGNOSIS — R29898 Other symptoms and signs involving the musculoskeletal system: Secondary | ICD-10-CM

## 2020-12-18 DIAGNOSIS — Z741 Need for assistance with personal care: Secondary | ICD-10-CM | POA: Insufficient documentation

## 2020-12-18 DIAGNOSIS — M25559 Pain in unspecified hip: Secondary | ICD-10-CM | POA: Insufficient documentation

## 2020-12-18 DIAGNOSIS — R7989 Other specified abnormal findings of blood chemistry: Secondary | ICD-10-CM

## 2020-12-18 DIAGNOSIS — J90 Pleural effusion, not elsewhere classified: Secondary | ICD-10-CM | POA: Insufficient documentation

## 2020-12-18 DIAGNOSIS — M25562 Pain in left knee: Secondary | ICD-10-CM

## 2020-12-18 DIAGNOSIS — R296 Repeated falls: Secondary | ICD-10-CM | POA: Insufficient documentation

## 2020-12-18 DIAGNOSIS — Z7409 Other reduced mobility: Secondary | ICD-10-CM | POA: Insufficient documentation

## 2020-12-18 DIAGNOSIS — R32 Unspecified urinary incontinence: Secondary | ICD-10-CM | POA: Insufficient documentation

## 2020-12-18 HISTORY — DX: Other symptoms and signs involving the musculoskeletal system: R29.898

## 2020-12-18 HISTORY — DX: Pain in left knee: M25.562

## 2020-12-18 HISTORY — DX: Need for assistance with personal care: Z74.1

## 2020-12-18 HISTORY — DX: Other specified abnormal findings of blood chemistry: R79.89

## 2020-12-18 HISTORY — DX: Pain in unspecified hip: M25.559

## 2020-12-18 HISTORY — DX: Pleural effusion, not elsewhere classified: J90

## 2020-12-18 HISTORY — DX: Repeated falls: R29.6

## 2020-12-18 NOTE — Progress Notes (Addendum)
Chief Complaint  Patient presents with   Hospitalization Follow-up   HFU [facility] in ED with son Marlou Sa today  1. 11/22/20 had fall now with left hip pain and left lower leg pain knee pain and since at least 11/20/20 and muscle spasms in left lower leg  High fall risk and feels unsteady on feet., b/l leg weakness baseline walks with rollator with seat Given lidocaine 5% patch in the ED which helped 0/10 pain with patch but was in severe pain  Requesting PT with [facility]/RN and already established for falls at home  Pt requesting manual wheelchair, hospital bed with foam gel overlay. She already has toilet seat elevator, bedside commode, lift chair, rollator with seat  2. Lesion to scalp rec f/u dermatology  3. Back surgery for chronic low back pain lumbar radiculopathy with b/l leg weakness sch 12/2020 with Dr. Edison Simon with Dr. Daun Peacock who stated left leg sx could be from back and hold on MRI for 2 weeks after surgery which is sch 12/11/20 left knee 4. Urinary incontinence est [facility] rec f/u she wants pure wick and will have her disc with them  5. Valvular heart disease c/o worsening sob with exertion will repeat echo cardiology is at [facility] in [place] H/o NSIP/asthma/copd but worsening sob esp with exertion uses pulmicort neb but still not improving on 4L O2 at times for sob   Review of Systems  Constitutional:  Negative for weight loss.  HENT:  Negative for hearing loss.   Eyes:  Negative for blurred vision.  Respiratory:  Positive for shortness of breath.   Cardiovascular:  Negative for chest pain.  Gastrointestinal:  Negative for abdominal pain and blood in stool.  Genitourinary:  Negative for dysuria.  Musculoskeletal:  Positive for back pain, falls and joint pain.  Skin:  Negative for rash.  Neurological:  Positive for weakness. Negative for headaches.  Psychiatric/Behavioral:  Negative for depression.   Past Medical History:  Diagnosis Date   Anxiety     Aortic stenosis 07/09/2015   a.) TTE 07/06/2015: EF 55-60%; mild AS with MPG 13.0 mmHg.   Asthma    Bipolar disorder ([place])    C. difficile diarrhea 2010   a.) following ABX course during hospital admission   Carotid atherosclerosis, bilateral    a.) Moderate; < 50% stenosis BILATERAL ICAs.   Cataract    a.) s/p BILATERAL extraction   Chicken pox    CKD (chronic kidney disease), stage III (HCC)    a. s/p R nephrectomy./ aneurysm   Conversion disorder    COPD (chronic obstructive pulmonary disease) (HCC)    Depression    Essential hypertension    GERD (gastroesophageal reflux disease)    Heart murmur    Hyperlipidemia    ILD (interstitial lung disease) (HCC)    mild; 2/2 RA diagnosis   Inflammatory arthritis    a. hands/carpal tunnel.  b. Low titer rheumatoid factor. c. Negative anti-CCP antibodies. d. Plaquenil.   Macular degeneration    Nocturnal hypoxemia    Non-Obstructive CAD    a. 07/2009 Cath ([facility]): nonobs dzs;  b. 03/2011 Cath [facility] nonobs dzs.   Osteoarthritis    a. Knees.   PAD (peripheral artery disease) (HCC)    PUD (peptic ulcer disease)    S/P right hip fracture    11/01/16 s/p repair   Shoulder pain    Sleep apnea    no cpap / minimal   Spinal stenosis at L4-L5 level  severe with L4/L5 anterolisthesis grade 1 anterolisthesis    Toxic maculopathy    Valvular heart disease    a.) TTE 07/06/2015: EF 55-60%; Mild MR/AR/TR; Mild AS with MPG 13.0 mmHg.   Past Surgical History:  Procedure Laterality Date   APPENDECTOMY     BACK SURGERY     lumbar fusion   BUNIONECTOMY Right    CATARACT EXTRACTION, BILATERAL     CESAREAN SECTION     x1   CHOLECYSTECTOMY N/A 05/11/2016   Procedure: LAPAROSCOPIC CHOLECYSTECTOMY;  Surgeon: Florene Glen, MD;  Location: [facility] ORS;  Service: General;  Laterality: N/A;   COLONOSCOPY WITH PROPOFOL N/A 04/02/2016   Procedure: COLONOSCOPY WITH PROPOFOL;  Surgeon: Jonathon Bellows, MD;  Location: [facility] ENDOSCOPY;  Service: Endoscopy;   Laterality: N/A;   ENDOSCOPIC RETROGRADE CHOLANGIOPANCREATOGRAPHY (ERCP) WITH PROPOFOL N/A 05/08/2016   Procedure: ENDOSCOPIC RETROGRADE CHOLANGIOPANCREATOGRAPHY (ERCP) WITH PROPOFOL;  Surgeon: Lucilla Lame, MD;  Location: [facility] ENDOSCOPY;  Service: Endoscopy;  Laterality: N/A;   ERCP     with biliary spincterotomy 05/08/16 Dr. Allen Norris for choledocholithiasis    ESOPHAGEAL DILATION  04/02/2016   Procedure: ESOPHAGEAL DILATION;  Surgeon: Jonathon Bellows, MD;  Location: [facility] END[facility];  Service: Endoscopy;;   ESOPHAGOGASTRODUODENOSCOPY (EGD) WITH PROPOFOL N/A 04/02/2016   Procedure: ESOPHAGOGASTRODUODENOSCOPY (EGD) WITH PROPOFOL;  Surgeon: Jonathon Bellows, MD;  Location: [facility] END[facility];  Service: Endoscopy;  Laterality: N/A;   HIP ARTHROPLASTY Right 11/01/2016   Procedure: ARTHROPLASTY BIPOLAR HIP (HEMIARTHROPLASTY);  Surgeon: Corky Mull, MD;  Location: [facility] ORS;  Service: Orthopedics;  Laterality: Right;   LUMBAR LAMINECTOMY/DECOMPRESSION MICRODISCECTOMY N/A 12/11/2020   Procedure: LEFT L2-3 MICRODISCECTOMY, L3-4 AND L5-S1 DECOMPRESSION;  Surgeon: Meade Maw, MD;  Location: [facility] OR[facility];  Service: Neurosurgery;  Laterality: N/A;   NEPHRECTOMY  1988   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   ORIF SCAPHOID FRACTURE Left 12/21/2019   Procedure: OPEN REDUCTION INTERNAL FIXATION (ORIF) OF LEFT SCAPULAR NONUNION WITH BONE GRAFT;  Surgeon: Corky Mull, MD;  Location: [facility];  Service: Orthopedics;  Laterality: Left;   osteoporosis     noted DEXA 08/19/16    REPLACEMENT TOTAL KNEE Right    REVERSE SHOULDER ARTHROPLASTY Right 11/04/2017   Procedure: REVERSE SHOULDER ARTHROPLASTY;  Surgeon: Corky Mull, MD;  Location: [facility];  Service: Orthopedics;  Laterality: Right;   REVERSE SHOULDER ARTHROPLASTY Left 07/26/2018   Procedure: REVERSE SHOULDER ARTHROPLASTY;  Surgeon: Corky Mull, MD;  Location: [facility];  Service: Orthopedics;  Laterality: Left;   TONSILLECTOMY     TOTAL HIP  ARTHROPLASTY  12/10/11   [facility] left hip   TOTAL HIP ARTHROPLASTY Bilateral    TUBAL LIGATION     Family History  Problem Relation Age of Onset   Rheum arthritis Mother    Asthma Mother    Parkinson's disease Mother    Heart disease Mother    Stroke Mother    Hypertension Mother    Heart attack Father    Heart disease Father    Hypertension Father    Peripheral Artery Disease Father    Diabetes Son    Gout Son    Asthma Sister    Heart disease Sister    Lung cancer Sister    Heart disease Sister    Heart disease Sister    Breast cancer Sister    Heart attack Sister    Heart disease Brother    Heart disease Maternal Grandmother    Diabetes Maternal Grandmother    Colon  cancer Maternal Grandmother    Cancer Maternal Grandmother        Hodgkins lymphoma   Heart disease Brother    Alcohol abuse Brother    Depression Brother    Dementia Son    Social History   Socioeconomic History   Marital status: Widowed    Spouse name: richard   Number of children: 2   Years of education: Some Coll   Highest education level: Some college, no degree  Occupational History   Occupation: Retired    Comment: retired  Tobacco Use   Smoking status: Former    Packs/day: 0.50    Years: 20.00    Pack years: 10.00    Types: Cigarettes    Quit date: 02/02/1974    Years since quitting: 46.9   Smokeless tobacco: Never  Vaping Use   Vaping Use: Never used  Substance and Sexual Activity   Alcohol use: No   Drug use: No   Sexual activity: Not Currently  Other Topics Concern   Not on file  Social History Narrative   Lives at home in [address] grandson, his wife and their child.   Husband Almas Rake died covid 63 late 1/209married x 47 years   Right-handed.   6 cups coffee per day.   Social Determinants of Health   Financial Resource Strain: Not on file  Food Insecurity: Not on file  Transportation Needs: Not on file  Physical Activity: Not on file  Stress: Not on file   Social Connections: Not on file  Intimate Partner Violence: Not on file   Current Meds  Medication Sig   Adalimumab 40 MG/0.4ML PNKT Inject 40 mg into the skin every 14 (fourteen) days.   albuterol (VENTOLIN HFA) 108 (90 Base) MCG/ACT inhaler Inhale 2 puffs into the lungs every 6 (six) hours as needed for wheezing or shortness of breath.   amLODipine (NORVASC) 2.5 MG tablet Take 1 tablet (2.5 mg total) by mouth daily.   benzonatate (TESSALON) 200 MG capsule Take 1 capsule (200 mg total) by mouth 3 (three) times daily as needed for cough.   budesonide (PULMICORT) 0.25 MG/2ML nebulizer solution USE 1 VIAL  IN  NEBULIZER TWICE  DAILY - rinse mouth after treatment   cyclobenzaprine (FLEXERIL) 10 MG tablet Take 1 tablet (10 mg total) by mouth 3 (three) times daily as needed for muscle spasms.   dicyclomine (BENTYL) 10 MG capsule Take 1 capsule (10 mg total) by mouth 2 (two) times daily as needed for spasms.   escitalopram (LEXAPRO) 10 MG tablet TAKE 1 TABLET BY MOUTH EVERY DAY   famotidine (PEPCID) 20 MG tablet TAKE 1 TABLET (20 MG TOTAL) BY MOUTH DAILY. BEFORE BREAKFAST OR DINNER   fluticasone (FLONASE) 50 MCG/ACT nasal spray Place 2 sprays into both nostrils daily. In am   gabapentin (NEURONTIN) 300 MG capsule Take 2 capsules (600 mg total) by mouth in the morning and at bedtime.   guaiFENesin (MUCINEX) 600 MG 12 hr tablet Take 1 tablet (600 mg total) by mouth 2 (two) times daily.   ipratropium-albuterol (DUONEB) 0.5-2.5 (3) MG/3ML SOLN TAKE 3 MLS BY NEBULIZATION 3 (THREE) TIMES DAILY AS NEEDED. (Patient taking differently: Take 3 mLs by nebulization 2 (two) times daily.)   lamoTRIgine (LAMICTAL) 100 MG tablet TAKE 1 TAB 2 TIMES DAILY. FURTHER REFILLS NEW PSYCH FOR ALL PSYCH MEDS ONLY TEMP SUPPLY FROM PCP   leflunomide (ARAVA) 20 MG tablet Take 1 tablet (20 mg total) by mouth daily.   lidocaine (LIDODERM)  5 % Place 1 patch onto the skin 2 (two) times daily as needed. Remove & Discard patch within  12 hours or as directed by MD   lovastatin (MEVACOR) 20 MG tablet TAKE 1 TABLET BY MOUTH EVERYDAY AT BEDTIME *STOP TALKING 40MG *   montelukast (SINGULAIR) 10 MG tablet TAKE 1 TABLET BY MOUTH EVERY DAY   nebivolol (BYSTOLIC) 10 MG tablet Take 0.5 tablets (5 mg total) by mouth in the morning and at bedtime.   pantoprazole (PROTONIX) 40 MG tablet TAKE 1 TABLET BY MOUTH 2 TIMES DAILY 30 MIN BEFORE FOOD (NOTE REDUCTION IN FREQUENCY)   predniSONE (DELTASONE) 5 MG tablet Take 40mg  po daily for 2 days then 30mg  daily for 2 days then 20mg  daily for 2 days then 10mg  daily for 2 days then 7.5mg  daily   QUEtiapine (SEROQUEL) 25 MG tablet TAKE 1 TABLET (25 MG TOTAL) BY MOUTH AT BEDTIME. AGAIN LAST FILL FURTHER REFILLS FROM PSYCHIATRY NO EXCEPTIONS   sucralfate (CARAFATE) 1 g tablet Take 1 tablet (1 g total) by mouth 2 (two) times daily.   zoledronic acid (RECLAST) 5 MG/100ML SOLN injection Inject 5 mg into the vein once.   [DISCONTINUED] ALPRAZolam (XANAX) 0.25 MG tablet TAKE 1 TABLET BY MOUTH AT BEDTIME AS NEEDED FOR ANXIETY.   [DISCONTINUED] HYDROcodone-acetaminophen (NORCO/VICODIN) 5-325 MG tablet Take 1 tablet by mouth every 6 (six) hours as needed for moderate pain.   [DISCONTINUED] MYRBETRIQ 50 MG TB24 tablet TAKE 1 TABLET BY MOUTH EVERYDAY AT BEDTIME   [DISCONTINUED] oxyCODONE-acetaminophen (PERCOCET) 5-325 MG tablet Take 1 tablet by mouth every 4 (four) hours as needed for severe pain.   Allergies  Allergen Reactions   Ceftin [Cefuroxime Axetil] Anaphylaxis   Lisinopril Anaphylaxis   Sulfa Antibiotics Other (See Comments)    Face swelling   Sulfasalazine Anaphylaxis   Morphine Other (See Comments)    Per patient, low blood pressure issues that requires action to raise it back up. Can take small infrequent doses   Xarelto [Rivaroxaban] Other (See Comments)    Stomach burning, bleeding, and tar in stool   Adhesive [Tape] Rash    Paper tape and tega derm OK   Antihistamines, Chlorpheniramine-Type  Other (See Comments)    Makes pt hyper   Antivert [Meclizine Hcl] Other (See Comments)    Bladder will not empty   Aspirin Other (See Comments)    Sulfasalazine allergy cross reacts   Contrast Media [Iodinated Diagnostic Agents] Rash    she is able to use betadine scrubs.   Decongestant [Pseudoephedrine Hcl] Other (See Comments)    Makes pt hyper   Doxycycline Other (See Comments)    GI upset   Levaquin [Levofloxacin In D5w] Rash   Polymyxin B Other (See Comments)    Facial rash   Tetanus Toxoids Rash and Other (See Comments)    Fever and hot to touch at injection site   Recent Results (from the past 2160 hour(s))  Basic metabolic panel     Status: Abnormal   Collection Time: 10/08/20 11:27 AM  Result Value Ref Range   Sodium 141 135 - 145 mmol/L   Potassium 4.1 3.5 - 5.1 mmol/L   Chloride 104 98 - 111 mmol/L   CO2 28 22 - 32 mmol/L   Glucose, Bld 134 (H) 70 - 99 mg/dL    Comment: Glucose reference range applies only to samples taken after fasting for at least 8 hours.   BUN 19 8 - 23 mg/dL   Creatinine, Ser 1.42 (H) 0.44 -  1.00 mg/dL   Calcium 9.1 8.9 - 10.3 mg/dL   GFR, Estimated 38 (L) >60 mL/min    Comment: (NOTE) Calculated using the CKD-EPI Creatinine Equation (2021)    Anion gap 9 5 - 15    Comment: Performed at [facility], [address] [place], [place] [address]  CBC     Status: Abnormal   Collection Time: 10/08/20 11:27 AM  Result Value Ref Range   WBC 11.6 (H) 4.0 - 10.5 K/uL   RBC 4.40 3.87 - 5.11 MIL/uL   Hemoglobin 12.9 12.0 - 15.0 g/dL   HCT 39.4 36.0 - 46.0 %   MCV 89.5 80.0 - 100.0 fL   MCH 29.3 26.0 - 34.0 pg   MCHC 32.7 30.0 - 36.0 g/dL   RDW 14.7 11.5 - 15.5 %   Platelets 218 150 - 400 K/uL   nRBC 0.0 0.0 - 0.2 %    Comment: Performed at [facility], [place], [place] [address]  Troponin I (High Sensitivity)     Status: None   Collection Time: 10/08/20 11:27 AM  Result Value Ref Range   Troponin I (High  Sensitivity) 17 <18 ng/L    Comment: (NOTE) Elevated high sensitivity troponin I (hsTnI) values and significant  changes across serial measurements may suggest ACS but many other  chronic and acute conditions are known to elevate hsTnI results.  Refer to the "Links" section for chest pain algorithms and additional  guidance. Performed at [facility], [place], [place] [address]   Troponin I (High Sensitivity)     Status: None   Collection Time: 10/08/20  2:54 PM  Result Value Ref Range   Troponin I (High Sensitivity) 16 <18 ng/L    Comment: (NOTE) Elevated high sensitivity troponin I (hsTnI) values and significant  changes across serial measurements may suggest ACS but many other  chronic and acute conditions are known to elevate hsTnI results.  Refer to the "Links" section for chest pain algorithms and additional  guidance. Performed at [facility], [address] [place], [place] [address]   Resp Panel by RT-PCR (Flu A&B, Covid) Nasopharyngeal Swab     Status: None   Collection Time: 10/08/20  3:02 PM   Specimen: Nasopharyngeal Swab; Nasopharyngeal(NP) swabs in vial transport medium  Result Value Ref Range   SARS Coronavirus 2 by RT PCR NEGATIVE NEGATIVE    Comment: (NOTE) SARS-CoV-2 target nucleic acids are NOT DETECTED.  The SARS-CoV-2 RNA is generally detectable in upper respiratory specimens during the acute phase of infection. The lowest concentration of SARS-CoV-2 viral copies this assay can detect is 138 copies/mL. A negative result does not preclude SARS-Cov-2 infection and should not be used as the sole basis for treatment or other patient management decisions. A negative result may occur with  improper specimen collection/handling, submission of specimen other than nasopharyngeal swab, presence of viral mutation(s) within the areas targeted by this assay, and inadequate number of viral copies(<138 copies/mL). A negative result  must be combined with clinical observations, patient history, and epidemiological information. The expected result is Negative.  Fact Sheet for Patients:  EntrepreneurPulse.com.au  Fact Sheet for Healthcare Providers:  IncredibleEmployment.be  This test is no t yet approved or cleared by the [place] FDA and  has been authorized for detection and/or diagnosis of SARS-CoV-2 by FDA under an Emergency Use Authorization (EUA). This EUA will remain  in effect (meaning this test can be used) for the  duration of the COVID-19 declaration under Section 564(b)(1) of the Act, 21 U.S.C.section 360bbb-3(b)(1), unless the authorization is terminated  or revoked sooner.       Influenza A by PCR NEGATIVE NEGATIVE   Influenza B by PCR NEGATIVE NEGATIVE    Comment: (NOTE) The Xpert Xpress SARS-CoV-2/FLU/RSV plus assay is intended as an aid in the diagnosis of influenza from Nasopharyngeal swab specimens and should not be used as a sole basis for treatment. Nasal washings and aspirates are unacceptable for Xpert Xpress SARS-CoV-2/FLU/RSV testing.  Fact Sheet for Patients: EntrepreneurPulse.com.au  Fact Sheet for Healthcare Providers: IncredibleEmployment.be  This test is not yet approved or cleared by the [place] FDA and has been authorized for detection and/or diagnosis of SARS-CoV-2 by FDA under an Emergency Use Authorization (EUA). This EUA will remain in effect (meaning this test can be used) for the duration of the COVID-19 declaration under Section 564(b)(1) of the Act, 21 U.S.C. section 360bbb-3(b)(1), unless the authorization is terminated or revoked.  Performed at [facility], [address] [place], [place] [address]   Comprehensive metabolic panel     Status: Abnormal   Collection Time: 10/29/20  1:22 PM  Result Value Ref Range   Sodium 139 135 - 145 mmol/L   Potassium 4.5 3.5 - 5.1  mmol/L   Chloride 102 98 - 111 mmol/L   CO2 26 22 - 32 mmol/L   Glucose, Bld 146 (H) 70 - 99 mg/dL    Comment: Glucose reference range applies only to samples taken after fasting for at least 8 hours.   BUN 15 8 - 23 mg/dL   Creatinine, Ser 1.24 (H) 0.44 - 1.00 mg/dL   Calcium 8.4 (L) 8.9 - 10.3 mg/dL   Total Protein 6.7 6.5 - 8.1 g/dL   Albumin 3.4 (L) 3.5 - 5.0 g/dL   AST 20 15 - 41 U/L   ALT 16 0 - 44 U/L   Alkaline Phosphatase 77 38 - 126 U/L   Total Bilirubin 0.6 0.3 - 1.2 mg/dL   GFR, Estimated 45 (L) >60 mL/min    Comment: (NOTE) Calculated using the CKD-EPI Creatinine Equation (2021)    Anion gap 11 5 - 15    Comment: Performed at [facility], [place], [place] [address]  Troponin I (High Sensitivity)     Status: None   Collection Time: 10/29/20  1:22 PM  Result Value Ref Range   Troponin I (High Sensitivity) 11 <18 ng/L    Comment: (NOTE) Elevated high sensitivity troponin I (hsTnI) values and significant  changes across serial measurements may suggest ACS but many other  chronic and acute conditions are known to elevate hsTnI results.  Refer to the "Links" section for chest pain algorithms and additional  guidance. Performed at [facility], [address] [place], [place] [address]   Brain natriuretic peptide     Status: Abnormal   Collection Time: 10/29/20  1:22 PM  Result Value Ref Range   B Natriuretic Peptide 209.3 (H) 0.0 - 100.0 pg/mL    Comment: Performed at [facility], [address] [place],  [address]  CBC with Differential     Status: Abnormal   Collection Time: 10/29/20  1:22 PM  Result Value Ref Range   WBC 12.2 (H) 4.0 - 10.5 K/uL   RBC 4.08 3.87 - 5.11 MIL/uL   Hemoglobin 11.8 (L) 12.0 - 15.0 g/dL   HCT 37.1 36.0 - 46.0 %   MCV  90.9 80.0 - 100.0 fL   MCH 28.9 26.0 - 34.0 pg   MCHC 31.8 30.0 - 36.0 g/dL   RDW 14.6 11.5 - 15.5 %   Platelets 222 150 - 400 K/uL   nRBC 0.0 0.0 - 0.2 %    Neutrophils Relative % 63 %   Neutro Abs 7.7 1.7 - 7.7 K/uL   Lymphocytes Relative 22 %   Lymphs Abs 2.7 0.7 - 4.0 K/uL   Monocytes Relative 8 %   Monocytes Absolute 1.0 0.1 - 1.0 K/uL   Eosinophils Relative 5 %   Eosinophils Absolute 0.6 (H) 0.0 - 0.5 K/uL   Basophils Relative 1 %   Basophils Absolute 0.1 0.0 - 0.1 K/uL   Immature Granulocytes 1 %   Abs Immature Granulocytes 0.15 (H) 0.00 - 0.07 K/uL    Comment: Performed at [facility], [address] [place], [place] [address]  Resp Panel by RT-PCR (Flu A&B, Covid) Nasopharyngeal Swab     Status: None   Collection Time: 10/29/20  1:22 PM   Specimen: Nasopharyngeal Swab; Nasopharyngeal(NP) swabs in vial transport medium  Result Value Ref Range   SARS Coronavirus 2 by RT PCR NEGATIVE NEGATIVE    Comment: (NOTE) SARS-CoV-2 target nucleic acids are NOT DETECTED.  The SARS-CoV-2 RNA is generally detectable in upper respiratory specimens during the acute phase of infection. The lowest concentration of SARS-CoV-2 viral copies this assay can detect is 138 copies/mL. A negative result does not preclude SARS-Cov-2 infection and should not be used as the sole basis for treatment or other patient management decisions. A negative result may occur with  improper specimen collection/handling, submission of specimen other than nasopharyngeal swab, presence of viral mutation(s) within the areas targeted by this assay, and inadequate number of viral copies(<138 copies/mL). A negative result must be combined with clinical observations, patient history, and epidemiological information. The expected result is Negative.  Fact Sheet for Patients:  EntrepreneurPulse.com.au  Fact Sheet for Healthcare Providers:  IncredibleEmployment.be  This test is no t yet approved or cleared by the [place] FDA and  has been authorized for detection and/or diagnosis of SARS-CoV-2 by FDA under an Emergency Use  Authorization (EUA). This EUA will remain  in effect (meaning this test can be used) for the duration of the COVID-19 declaration under Section 564(b)(1) of the Act, 21 U.S.C.section 360bbb-3(b)(1), unless the authorization is terminated  or revoked sooner.       Influenza A by PCR NEGATIVE NEGATIVE   Influenza B by PCR NEGATIVE NEGATIVE    Comment: (NOTE) The Xpert Xpress SARS-CoV-2/FLU/RSV plus assay is intended as an aid in the diagnosis of influenza from Nasopharyngeal swab specimens and should not be used as a sole basis for treatment. Nasal washings and aspirates are unacceptable for Xpert Xpress SARS-CoV-2/FLU/RSV testing.  Fact Sheet for Patients: EntrepreneurPulse.com.au  Fact Sheet for Healthcare Providers: IncredibleEmployment.be  This test is not yet approved or cleared by the [place] FDA and has been authorized for detection and/or diagnosis of SARS-CoV-2 by FDA under an Emergency Use Authorization (EUA). This EUA will remain in effect (meaning this test can be used) for the duration of the COVID-19 declaration under Section 564(b)(1) of the Act, 21 U.S.C. section 360bbb-3(b)(1), unless the authorization is terminated or revoked.  Performed at [facility], [address] [place], [place] [address]   Basic metabolic panel     Status: Abnormal   Collection Time: 10/30/20  6:35 AM  Result Value Ref Range  Sodium 141 135 - 145 mmol/L   Potassium 4.1 3.5 - 5.1 mmol/L   Chloride 103 98 - 111 mmol/L   CO2 28 22 - 32 mmol/L   Glucose, Bld 207 (H) 70 - 99 mg/dL    Comment: Glucose reference range applies only to samples taken after fasting for at least 8 hours.   BUN 15 8 - 23 mg/dL   Creatinine, Ser 1.21 (H) 0.44 - 1.00 mg/dL   Calcium 8.5 (L) 8.9 - 10.3 mg/dL   GFR, Estimated 46 (L) >60 mL/min    Comment: (NOTE) Calculated using the CKD-EPI Creatinine Equation (2021)    Anion gap 10 5 - 15    Comment:  Performed at [facility], [address] [place], [place] [address]  CBC     Status: Abnormal   Collection Time: 10/30/20  6:35 AM  Result Value Ref Range   WBC 8.1 4.0 - 10.5 K/uL   RBC 4.05 3.87 - 5.11 MIL/uL   Hemoglobin 11.4 (L) 12.0 - 15.0 g/dL   HCT 36.4 36.0 - 46.0 %   MCV 89.9 80.0 - 100.0 fL   MCH 28.1 26.0 - 34.0 pg   MCHC 31.3 30.0 - 36.0 g/dL   RDW 14.6 11.5 - 15.5 %   Platelets 217 150 - 400 K/uL   nRBC 0.0 0.0 - 0.2 %    Comment: Performed at [facility], [place], [place] [address]  Troponin I (High Sensitivity)     Status: None   Collection Time: 10/30/20  6:35 AM  Result Value Ref Range   Troponin I (High Sensitivity) 15 <18 ng/L    Comment: (NOTE) Elevated high sensitivity troponin I (hsTnI) values and significant  changes across serial measurements may suggest ACS but many other  chronic and acute conditions are known to elevate hsTnI results.  Refer to the "Links" section for chest pain algorithms and additional  guidance. Performed at [facility] [facility], [address] [place],  [address]   Hemoglobin A1c     Status: Abnormal   Collection Time: 10/30/20  6:36 AM  Result Value Ref Range   Hgb A1c MFr Bld 6.1 (H) 4.8 - 5.6 %    Comment: (NOTE)         Prediabetes: 5.7 - 6.4         Diabetes: >6.4         Glycemic control for adults with diabetes: <7.0    Mean Plasma Glucose 128 mg/dL    Comment: (NOTE) Performed At: [facility] [place], [place] [id number] Rush Farmer MD UY:[id number]   CBG monitoring, ED     Status: Abnormal   Collection Time: 10/30/20 11:52 AM  Result Value Ref Range   Glucose-Capillary 237 (H) 70 - 99 mg/dL    Comment: Glucose reference range applies only to samples taken after fasting for at least 8 hours.  CBG monitoring, ED     Status: Abnormal   Collection Time: 10/30/20  4:42 PM  Result Value Ref Range   Glucose-Capillary 196 (H) 70 - 99 mg/dL     Comment: Glucose reference range applies only to samples taken after fasting for at least 8 hours.  CBG monitoring, ED     Status: Abnormal   Collection Time: 10/30/20  9:25 PM  Result Value Ref Range   Glucose-Capillary 172 (H) 70 - 99 mg/dL    Comment: Glucose reference range applies only to samples taken after fasting for  at least 8 hours.  Glucose, capillary     Status: Abnormal   Collection Time: 10/31/20  7:36 AM  Result Value Ref Range   Glucose-Capillary 148 (H) 70 - 99 mg/dL    Comment: Glucose reference range applies only to samples taken after fasting for at least 8 hours.  Glucose, capillary     Status: Abnormal   Collection Time: 10/31/20 11:28 AM  Result Value Ref Range   Glucose-Capillary 202 (H) 70 - 99 mg/dL    Comment: Glucose reference range applies only to samples taken after fasting for at least 8 hours.  Glucose, capillary     Status: Abnormal   Collection Time: 10/31/20  4:09 PM  Result Value Ref Range   Glucose-Capillary 155 (H) 70 - 99 mg/dL    Comment: Glucose reference range applies only to samples taken after fasting for at least 8 hours.  Glucose, capillary     Status: Abnormal   Collection Time: 10/31/20  8:44 PM  Result Value Ref Range   Glucose-Capillary 173 (H) 70 - 99 mg/dL    Comment: Glucose reference range applies only to samples taken after fasting for at least 8 hours.   Comment 1 Notify RN    Comment 2 Document in Chart   Glucose, capillary     Status: Abnormal   Collection Time: 10/31/20  9:01 PM  Result Value Ref Range   Glucose-Capillary 176 (H) 70 - 99 mg/dL    Comment: Glucose reference range applies only to samples taken after fasting for at least 8 hours.  Glucose, capillary     Status: Abnormal   Collection Time: 11/01/20  8:40 AM  Result Value Ref Range   Glucose-Capillary 132 (H) 70 - 99 mg/dL    Comment: Glucose reference range applies only to samples taken after fasting for at least 8 hours.  Glucose, capillary     Status:  Abnormal   Collection Time: 11/01/20 11:50 AM  Result Value Ref Range   Glucose-Capillary 293 (H) 70 - 99 mg/dL    Comment: Glucose reference range applies only to samples taken after fasting for at least 8 hours.  Glucose, capillary     Status: Abnormal   Collection Time: 11/01/20  3:47 PM  Result Value Ref Range   Glucose-Capillary 260 (H) 70 - 99 mg/dL    Comment: Glucose reference range applies only to samples taken after fasting for at least 8 hours.  Glucose, capillary     Status: Abnormal   Collection Time: 11/01/20  8:24 PM  Result Value Ref Range   Glucose-Capillary 190 (H) 70 - 99 mg/dL    Comment: Glucose reference range applies only to samples taken after fasting for at least 8 hours.   Comment 1 Notify RN    Comment 2 Document in Chart   CBC     Status: Abnormal   Collection Time: 11/02/20  5:10 AM  Result Value Ref Range   WBC 14.7 (H) 4.0 - 10.5 K/uL   RBC 3.73 (L) 3.87 - 5.11 MIL/uL   Hemoglobin 10.9 (L) 12.0 - 15.0 g/dL   HCT 33.6 (L) 36.0 - 46.0 %   MCV 90.1 80.0 - 100.0 fL   MCH 29.2 26.0 - 34.0 pg   MCHC 32.4 30.0 - 36.0 g/dL   RDW 15.1 11.5 - 15.5 %   Platelets 216 150 - 400 K/uL   nRBC 0.0 0.0 - 0.2 %    Comment: Performed at [facility], [address] [place], [place] [address]  Basic metabolic panel     Status: Abnormal   Collection Time: 11/02/20  5:10 AM  Result Value Ref Range   Sodium 139 135 - 145 mmol/L   Potassium 3.8 3.5 - 5.1 mmol/L   Chloride 105 98 - 111 mmol/L   CO2 26 22 - 32 mmol/L   Glucose, Bld 129 (H) 70 - 99 mg/dL    Comment: Glucose reference range applies only to samples taken after fasting for at least 8 hours.   BUN 23 8 - 23 mg/dL   Creatinine, Ser 1.07 (H) 0.44 - 1.00 mg/dL   Calcium 8.4 (L) 8.9 - 10.3 mg/dL   GFR, Estimated 53 (L) >60 mL/min    Comment: (NOTE) Calculated using the CKD-EPI Creatinine Equation (2021)    Anion gap 8 5 - 15    Comment: Performed at [facility], [address]  [place], [place] [address]  Brain natriuretic peptide     Status: Abnormal   Collection Time: 11/02/20  5:10 AM  Result Value Ref Range   B Natriuretic Peptide 238.5 (H) 0.0 - 100.0 pg/mL    Comment: Performed at [facility], [address] [place], [place] [address]  Glucose, capillary     Status: Abnormal   Collection Time: 11/02/20  7:38 AM  Result Value Ref Range   Glucose-Capillary 141 (H) 70 - 99 mg/dL    Comment: Glucose reference range applies only to samples taken after fasting for at least 8 hours.   Comment 1 Notify RN    Comment 2 Document in Chart   Glucose, capillary     Status: Abnormal   Collection Time: 11/02/20 12:05 PM  Result Value Ref Range   Glucose-Capillary 265 (H) 70 - 99 mg/dL    Comment: Glucose reference range applies only to samples taken after fasting for at least 8 hours.   Comment 1 Notify RN    Comment 2 Document in Chart   Glucose, capillary     Status: Abnormal   Collection Time: 11/02/20  4:21 PM  Result Value Ref Range   Glucose-Capillary 205 (H) 70 - 99 mg/dL    Comment: Glucose reference range applies only to samples taken after fasting for at least 8 hours.   Comment 1 Notify RN    Comment 2 Document in Chart   Glucose, capillary     Status: Abnormal   Collection Time: 11/02/20  8:49 PM  Result Value Ref Range   Glucose-Capillary 145 (H) 70 - 99 mg/dL    Comment: Glucose reference range applies only to samples taken after fasting for at least 8 hours.  Basic metabolic panel     Status: Abnormal   Collection Time: 11/03/20  5:48 AM  Result Value Ref Range   Sodium 139 135 - 145 mmol/L   Potassium 3.3 (L) 3.5 - 5.1 mmol/L   Chloride 99 98 - 111 mmol/L   CO2 33 (H) 22 - 32 mmol/L   Glucose, Bld 116 (H) 70 - 99 mg/dL    Comment: Glucose reference range applies only to samples taken after fasting for at least 8 hours.   BUN 28 (H) 8 - 23 mg/dL   Creatinine, Ser 1.27 (H) 0.44 - 1.00 mg/dL   Calcium 8.4 (L) 8.9 - 10.3 mg/dL   GFR,  Estimated 43 (L) >60 mL/min    Comment: (NOTE) Calculated using the CKD-EPI Creatinine Equation (2021)    Anion gap 7 5 - 15  Comment: Performed at [facility], [address] [place], [place] [address]  Magnesium     Status: None   Collection Time: 11/03/20  5:48 AM  Result Value Ref Range   Magnesium 1.9 1.7 - 2.4 mg/dL    Comment: Performed at [facility], [address] [place], [place] [address]  Glucose, capillary     Status: Abnormal   Collection Time: 11/03/20  8:30 AM  Result Value Ref Range   Glucose-Capillary 106 (H) 70 - 99 mg/dL    Comment: Glucose reference range applies only to samples taken after fasting for at least 8 hours.  Glucose, capillary     Status: Abnormal   Collection Time: 11/03/20 12:23 PM  Result Value Ref Range   Glucose-Capillary 280 (H) 70 - 99 mg/dL    Comment: Glucose reference range applies only to samples taken after fasting for at least 8 hours.  SARS CORONAVIRUS 2 (TAT 6-24 HRS) Nasopharyngeal Nasopharyngeal Swab     Status: None   Collection Time: 11/03/20  2:42 PM   Specimen: Nasopharyngeal Swab  Result Value Ref Range   SARS Coronavirus 2 NEGATIVE NEGATIVE    Comment: (NOTE) SARS-CoV-2 target nucleic acids are NOT DETECTED.  The SARS-CoV-2 RNA is generally detectable in upper and lower respiratory specimens during the acute phase of infection. Negative results do not preclude SARS-CoV-2 infection, do not rule out co-infections with other pathogens, and should not be used as the sole basis for treatment or other patient management decisions. Negative results must be combined with clinical observations, patient history, and epidemiological information. The expected result is Negative.  Fact Sheet for Patients: SugarRoll.be  Fact Sheet for Healthcare Providers: https://www.woods-mathews.com/  This test is not yet approved or cleared by the [place] FDA and  has  been authorized for detection and/or diagnosis of SARS-CoV-2 by FDA under an Emergency Use Authorization (EUA). This EUA will remain  in effect (meaning this test can be used) for the duration of the COVID-19 declaration under Se ction 564(b)(1) of the Act, 21 U.S.C. section 360bbb-3(b)(1), unless the authorization is terminated or revoked sooner.  Performed at [facility], [place] [address] [place], [place] [address]   Glucose, capillary     Status: Abnormal   Collection Time: 11/03/20  5:10 PM  Result Value Ref Range   Glucose-Capillary 189 (H) 70 - 99 mg/dL    Comment: Glucose reference range applies only to samples taken after fasting for at least 8 hours.  Glucose, capillary     Status: Abnormal   Collection Time: 11/03/20  8:22 PM  Result Value Ref Range   Glucose-Capillary 205 (H) 70 - 99 mg/dL    Comment: Glucose reference range applies only to samples taken after fasting for at least 8 hours.   Comment 1 Notify RN    Comment 2 Document in Chart   Glucose, capillary     Status: Abnormal   Collection Time: 11/04/20  8:01 AM  Result Value Ref Range   Glucose-Capillary 106 (H) 70 - 99 mg/dL    Comment: Glucose reference range applies only to samples taken after fasting for at least 8 hours.  Glucose, capillary     Status: Abnormal   Collection Time: 11/04/20 12:00 PM  Result Value Ref Range   Glucose-Capillary 306 (H) 70 - 99 mg/dL    Comment: Glucose reference range applies only to samples taken after fasting for at least 8 hours.  Glucose, capillary  Status: Abnormal   Collection Time: 11/04/20 12:41 PM  Result Value Ref Range   Glucose-Capillary 269 (H) 70 - 99 mg/dL    Comment: Glucose reference range applies only to samples taken after fasting for at least 8 hours.  Basic Metabolic Panel (BMET)     Status: Abnormal   Collection Time: 11/22/20  3:49 PM  Result Value Ref Range   Glucose, Bld 157 (H) 65 - 99 mg/dL    Comment: .            Fasting reference  interval . For someone without known diabetes, a glucose value >125 mg/dL indicates that they may have diabetes and this should be confirmed with a follow-up test. .    BUN 17 7 - 25 mg/dL   Creat 1.48 (H) 0.60 - 1.00 mg/dL   BUN/Creatinine Ratio 11 6 - 22 (calc)   Sodium 139 135 - 146 mmol/L   Potassium 4.5 3.5 - 5.3 mmol/L   Chloride 99 98 - 110 mmol/L   CO2 26 20 - 32 mmol/L   Calcium 9.4 8.6 - 10.4 mg/dL  CBC with Differential/Platelet     Status: Abnormal   Collection Time: 11/22/20  3:49 PM  Result Value Ref Range   WBC 10.8 3.8 - 10.8 Thousand/uL   RBC 4.41 3.80 - 5.10 Million/uL   Hemoglobin 12.7 11.7 - 15.5 g/dL   HCT 39.3 35.0 - 45.0 %   MCV 89.1 80.0 - 100.0 fL   MCH 28.8 27.0 - 33.0 pg   MCHC 32.3 32.0 - 36.0 g/dL   RDW 14.1 11.0 - 15.0 %   Platelets 185 140 - 400 Thousand/uL   MPV 11.6 7.5 - 12.5 fL   Neutro Abs 8,154 (H) 1,500 - 7,800 cells/uL   Lymphs Abs 1,652 850 - 3,900 cells/uL   Absolute Monocytes 745 200 - 950 cells/uL   Eosinophils Absolute 205 15 - 500 cells/uL   Basophils Absolute 43 0 - 200 cells/uL   Neutrophils Relative % 75.5 %   Total Lymphocyte 15.3 %   Monocytes Relative 6.9 %   Eosinophils Relative 1.9 %   Basophils Relative 0.4 %  Magnesium     Status: None   Collection Time: 11/22/20  3:49 PM  Result Value Ref Range   Magnesium 2.1 1.5 - 2.5 mg/dL  Bladder Scan (Post Void Residual) in office     Status: None   Collection Time: 12/04/20  2:23 PM  Result Value Ref Range   Scan Result 14mL   APTT     Status: None   Collection Time: 12/10/20  9:45 AM  Result Value Ref Range   aPTT 26 24 - 36 seconds    Comment: Performed at [facility], [address] [place], [place] [address]  Protime-INR     Status: None   Collection Time: 12/10/20  9:45 AM  Result Value Ref Range   Prothrombin Time 12.6 11.4 - 15.2 seconds   INR 0.9 0.8 - 1.2    Comment: (NOTE) INR goal varies based on device and disease states. Performed at  [facility], [address] [place], [place] [address]   Surgical pcr screen     Status: None   Collection Time: 12/10/20  9:45 AM   Specimen: Nasal Mucosa; Nasal Swab  Result Value Ref Range   MRSA, PCR NEGATIVE NEGATIVE   Staphylococcus aureus NEGATIVE NEGATIVE    Comment: (NOTE) The Xpert SA Assay (FDA approved for NASAL specimens in patients  60 years of age and older), is one component of a comprehensive surveillance program. It is not intended to diagnose infection nor to guide or monitor treatment. Performed at [facility], [address] [place], [place] [address]   Type and screen     Status: None   Collection Time: 12/10/20  9:45 AM  Result Value Ref Range   ABO/RH(D) AB POS    Antibody Screen NEG    Sample Expiration 12/24/2020,2359    Extend sample reason      NO TRANSFUSIONS OR PREGNANCY IN THE PAST 3 MONTHS Performed at [facility], [address] [place], [place] [address]   SARS CORONAVIRUS 2 (TAT 6-24 HRS) Nasopharyngeal Nasopharyngeal Swab     Status: None   Collection Time: 12/10/20 10:47 AM   Specimen: Nasopharyngeal Swab  Result Value Ref Range   SARS Coronavirus 2 NEGATIVE NEGATIVE    Comment: (NOTE) SARS-CoV-2 target nucleic acids are NOT DETECTED.  The SARS-CoV-2 RNA is generally detectable in upper and lower respiratory specimens during the acute phase of infection. Negative results do not preclude SARS-CoV-2 infection, do not rule out co-infections with other pathogens, and should not be used as the sole basis for treatment or other patient management decisions. Negative results must be combined with clinical observations, patient history, and epidemiological information. The expected result is Negative.  Fact Sheet for Patients: SugarRoll.be  Fact Sheet for Healthcare Providers: https://www.woods-mathews.com/  This test is not yet approved or cleared by the [place] FDA and  has been authorized for detection and/or diagnosis of SARS-CoV-2 by FDA under an Emergency Use Authorization (EUA). This EUA will remain  in effect (meaning this test can be used) for the duration of the COVID-19 declaration under Se ction 564(b)(1) of the Act, 21 U.S.C. section 360bbb-3(b)(1), unless the authorization is terminated or revoked sooner.  Performed at [facility], [place] [address] [place], [place] [address]   Urinalysis, Routine w reflex microscopic Urine, Unspecified Source     Status: Abnormal   Collection Time: 12/11/20  8:25 AM  Result Value Ref Range   Color, Urine YELLOW (A) YELLOW   APPearance HAZY (A) CLEAR   Specific Gravity, Urine 1.012 1.005 - 1.030   pH 6.0 5.0 - 8.0   Glucose, UA NEGATIVE NEGATIVE mg/dL   Hgb urine dipstick NEGATIVE NEGATIVE   Bilirubin Urine NEGATIVE NEGATIVE   Ketones, ur NEGATIVE NEGATIVE mg/dL   Protein, ur NEGATIVE NEGATIVE mg/dL   Nitrite NEGATIVE NEGATIVE   Leukocytes,Ua NEGATIVE NEGATIVE    Comment: Performed at [facility], [address] [place], [place] [address]   Objective  Body mass index is 34.58 kg/m. Wt Readings from Last 3 Encounters:  12/11/20 173 lb 8 oz (78.7 kg)  12/04/20 173 lb 9.6 oz (78.7 kg)  12/04/20 183 lb (83 kg)   Temp Readings from Last 3 Encounters:  12/13/20 98.4 F (36.9 C) (Oral)  12/04/20 (!) 97.1 F (36.2 C) (Oral)  11/22/20 97.7 F (36.5 C) (Oral)   BP Readings from Last 3 Encounters:  12/13/20 (!) 156/61  12/04/20 120/70  12/04/20 103/66   Pulse Readings from Last 3 Encounters:  12/13/20 82  12/04/20 96  12/04/20 96    Physical Exam Vitals and nursing note reviewed.  Constitutional:      Appearance: Normal appearance. She is well-developed and well-groomed.  HENT:     Head: Normocephalic and atraumatic.  Eyes:     Conjunctiva/sclera: Conjunctivae normal.  Pupils: Pupils are equal, round, and reactive to light.  Cardiovascular:     Rate  and Rhythm: Normal rate and regular rhythm.     Heart sounds: Normal heart sounds. No murmur heard. Pulmonary:     Effort: Pulmonary effort is normal.     Breath sounds: Normal breath sounds.  Abdominal:     General: Abdomen is flat. Bowel sounds are normal.     Tenderness: There is no abdominal tenderness.  Musculoskeletal:        General: No tenderness.  Skin:    General: Skin is warm and dry.  Neurological:     General: No focal deficit present.     Mental Status: She is alert and oriented to person, place, and time. Mental status is at baseline.     Cranial Nerves: Cranial nerves 2-12 are intact.     Gait: Gait is intact.     Comments: Using rollator with seat and brakes today  Psychiatric:        Attention and Perception: Attention and perception normal.        Mood and Affect: Mood and affect normal.        Speech: Speech normal.        Behavior: Behavior normal. Behavior is cooperative.        Thought Content: Thought content normal.        Cognition and Memory: Cognition and memory normal.        Judgment: Judgment normal.    Assessment  Plan  Assistance needed for impaired mobility Order for PT/OT/RN [facility] h/h   Rx manual wheelchair given  Face to face encounter with PCP today 11/22/20  Pt was evaluated and treated for condition/diagnosis that supports need for manual wheelchair and occurred today 11/22/20  She has mobility limitation see HPI baseline walks with rollator with seat and brakes that impairs ability to do 1 or more of MRADLs in a customary location at the home  Patient cannot use a cane, crutches, or walker to resolve the issue sufficiently Patient can safely propel the wheelchair in the home or have a caregiver that can aid She can ambulate <300 feet or less  with stopping to rest intermittently with rollator with seat and brakes and needs a manuel wheelchair to help with ambulation   Rx hospital bed with gel/foam overlay  Patient request  re-positioning of the body in ways that can not be achieved with an ordinary bed or wedge pillow, eliminate pain, reduce pressure, treat existence of pressure sores, etc Patient limited due to chronic pain/physical deconditioning/weakness and unable to change positions on her own Patient requires head of the bed to be elevated more than 30 degrees most of the time due to valvular heart disease ? CHF working up, COPD or problems with aspiration/swallowing  Patient requires traction equipment which can only be attached to a hospital bed Meets 1 of the hospital bed criteria above  AND clinical  notes show patient she meets the need for for hospital bed  Gel overlay She has inability to move in order to change body position Limited mobility  AND Urinary incontinence   For the wheelchair Patient can not use a cane or walker. Also if the Patient can safely propel herself or has a family member/caregiver that can push her.    For the hospital bed Patient needs to either have the head of the bed at 30 degrees or more or that the Patient needs repositioning that can not be done  in a regular bed. They then need the diagnosis stated for why the Patient needs an elevated head ofd the bed or repositioning.   Urinary incontinence, unspecified type - Plan: Ambulatory referral to Urology, Urinalysis, Routine w reflex microscopic, Urine Culture,   SOB (shortness of breath) on exertion with valvular heart disease hx with pleural effusion- Plan: ECHOCARDIOGRAM COMPLETE, Ambulatory referral to [place] Results for ESPYN, RADWAN (MRN [id number]) as of 12/18/2020 17:19  Ref. Range 10/29/2020 13:22 11/02/2020 05:10  B Natriuretic Peptide Latest Ref Range: 0.0 - 100.0 pg/mL 209.3 (H) 238.5 (H)    Acute pain of left knee - Plan: MR Knee Left  Wo Contrast Spoke with NS surgery 12/11/20 ?radiculopathy and wants to wait until 2 weeks after surgery 12/11/20 before doing MRI left knee   lidocaine (LIDODERM) 5 %,  Ambulatory referral to [place]  Other chronic pain - Plan: lidocaine (LIDODERM) 5 %  Hip pain - Plan: lidocaine (LIDODERM) 5 %  Frequent falls - Plan: Ambulatory referral to [place]  Weakness of both lower extremities  could be due to lumbar radiculopathy surgery with NS 12/11/20- Plan: Ambulatory referral to [place]   Skin lesion - Plan: Ambulatory referral to Dermatology Scalp lesion - Plan: Ambulatory referral to Dermatology Skin cancer screening - Plan: Ambulatory referral to Dermatology  HM Given flu shot today 11/22/20 utd  prevnar, pna 23 utd allergic to Tdap declines shingrix  covid 19 vaccine has not had due to allergies   mammo  04/26/2018 repeat normal referred again will need to be schedule as of 12/06/19 wants to wait due to left arm surgery   Colonoscopy 04/02/16 multiple polyps but tortuous colon GI does not want to do any further    Out of age window pap    DEXA 08/19/16 +osteoporosis on Forteo will need to repeat 1 year after Forteo use to see improvement rheumatology following [facility].  -DEXA 10/12/17 with T score -4.1 worse on forteo unable to do any further bone densities per pt 07/07/2018 due to h/o multiple ortho surgeries  +Osteoporosis  Off forteo was on x 2 years F/u [facility] rheumatology Dr. Jefm Bryant reclast upcoming 2021    Seeing dermatology in [place] changed referral [facility] seen 12/01/18 to get closer to home and due for f/u 05/2019 will encourage pt to call  To schedule appt  Dr. Kellie Moor 11/2019   Dr. Rosine Door Retinal specialist 08/2017, was scheduled 12/2018, 04/24/19, 05/22/19 injection 09/2019 Q 8 weeks    Consider h/o in future with persistent leukocytosis  -refer at f/u last CBC WBC normal 07/2019 care everywhere   Echo 11/22/18 [facility]  INTERPRETATION    NORMAL LEFT VENTRICULAR SYSTOLIC FUNCTION WITH MILD LVH    NORMAL RIGHT VENTRICULAR SYSTOLIC FUNCTION    VALVULAR REGURGITATION: MODERATE AR, MODERATE MR, TRIVIAL TR    NO VALVULAR  STENOSIS     repeat ech 10/06/19 echo [facility]   NORMAL LEFT VENTRICULAR SYSTOLIC FUNCTION    NORMAL RIGHT VENTRICULAR SYSTOLIC FUNCTION    VALVULAR REGURGITATION: MILD AR, MILD MR, TRIVIAL TR    NO VALVULAR STENOSIS    AORTIC SCLEROSIS    LIMITED ACOUSTIC WINDOWS      Provider: Dr. Olivia Mackie [facility]-Scocuzza-Internal Medicine

## 2020-12-18 NOTE — Telephone Encounter (Signed)
Fax note and RX [phone]  With RX for wheelchair and hospital bed    Can you make me a dot phrase talk to me about this please

## 2020-12-19 IMAGING — XA DG SHOULDER 2+V*L*
3 series · 3 of 3 positions shown · non-contrast
Comparison: CT shoulder 09/04/2019

CLINICAL DATA: Nonunion of scapula, internal fixation

EXAM:
LEFT SHOULDER - 2+ VIEW; DG C-ARM 1-60 MIN

[Series 3: cont. · 1 of 1 slices shown (1 of 3)]
[im 1/1]
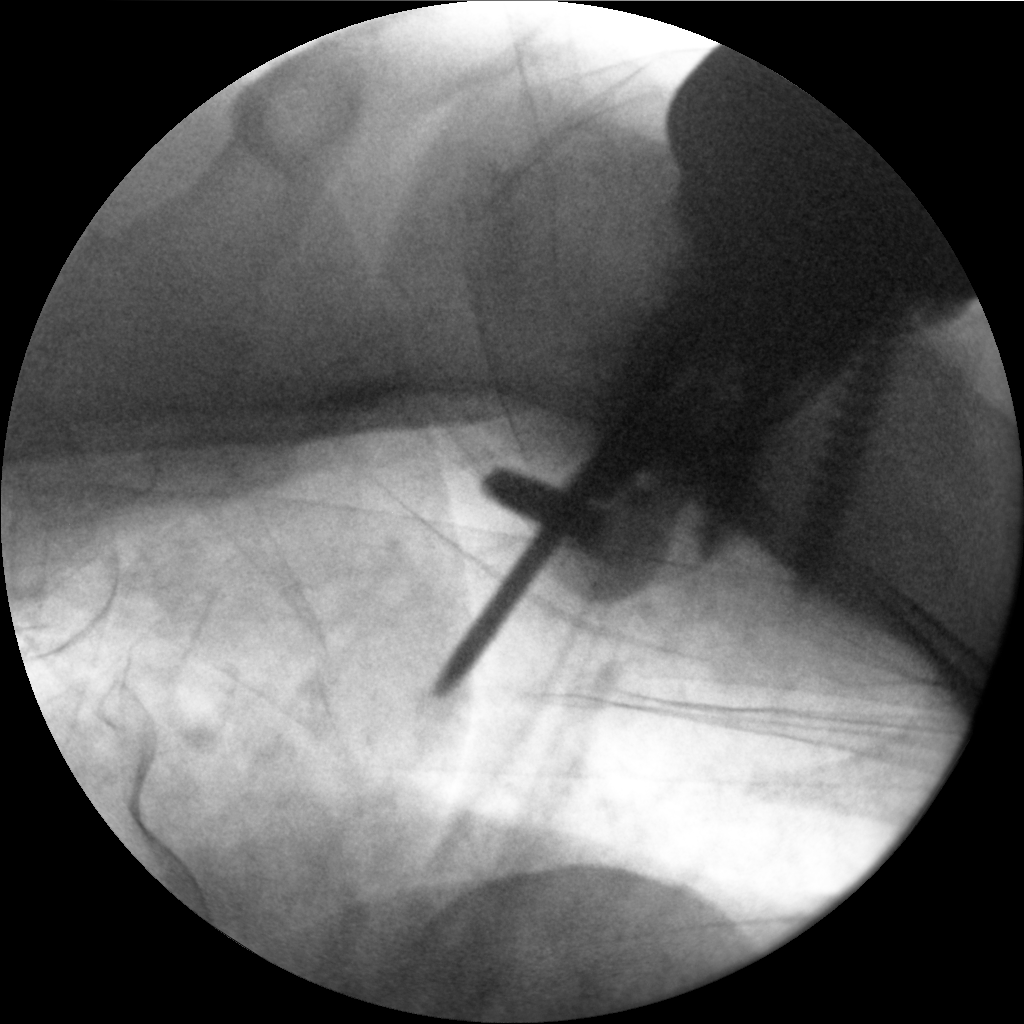

[Series 4: cont. · 1 of 1 slices shown (2 of 3)]
[im 1/1]
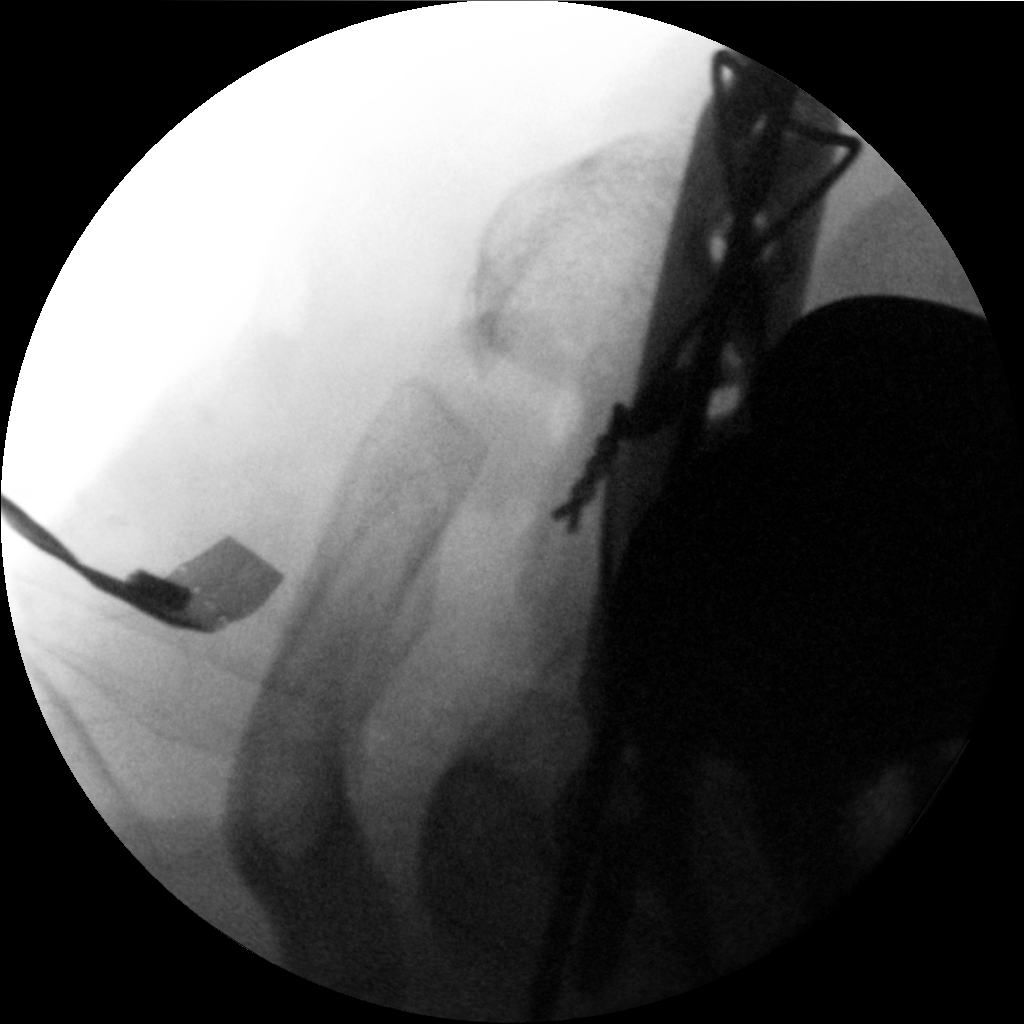

[Series 5: cont. · 1 of 1 slices shown (3 of 3)]
[im 1/1]
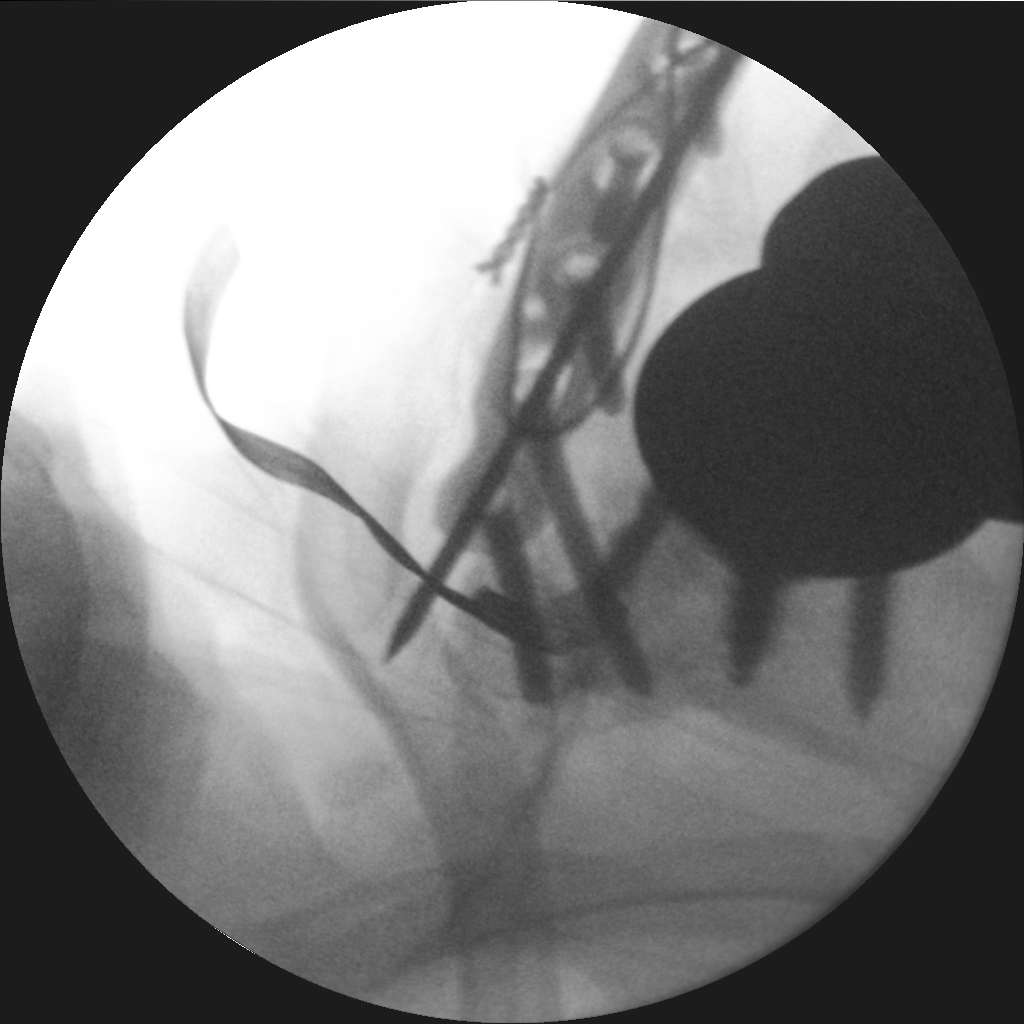

[3 of 3 positions shown; findings below may reference images not displayed]

FINDINGS: Intraoperative spot images are submitted demonstrating internal
fixation across the ununited scapular fracture at the base of the
scaphoid. No visible complicating feature.
IMPRESSION: Internal fixation.  No visible complicating feature.

## 2020-12-19 IMAGING — DX DG SHOULDER 2+V*L*
2 series · 2 of 2 positions shown · non-contrast
Comparison: None.

CLINICAL DATA: ORIF left scapula

EXAM:
LEFT SHOULDER - 2+ VIEW

[shoulder ap (1 of 2)]
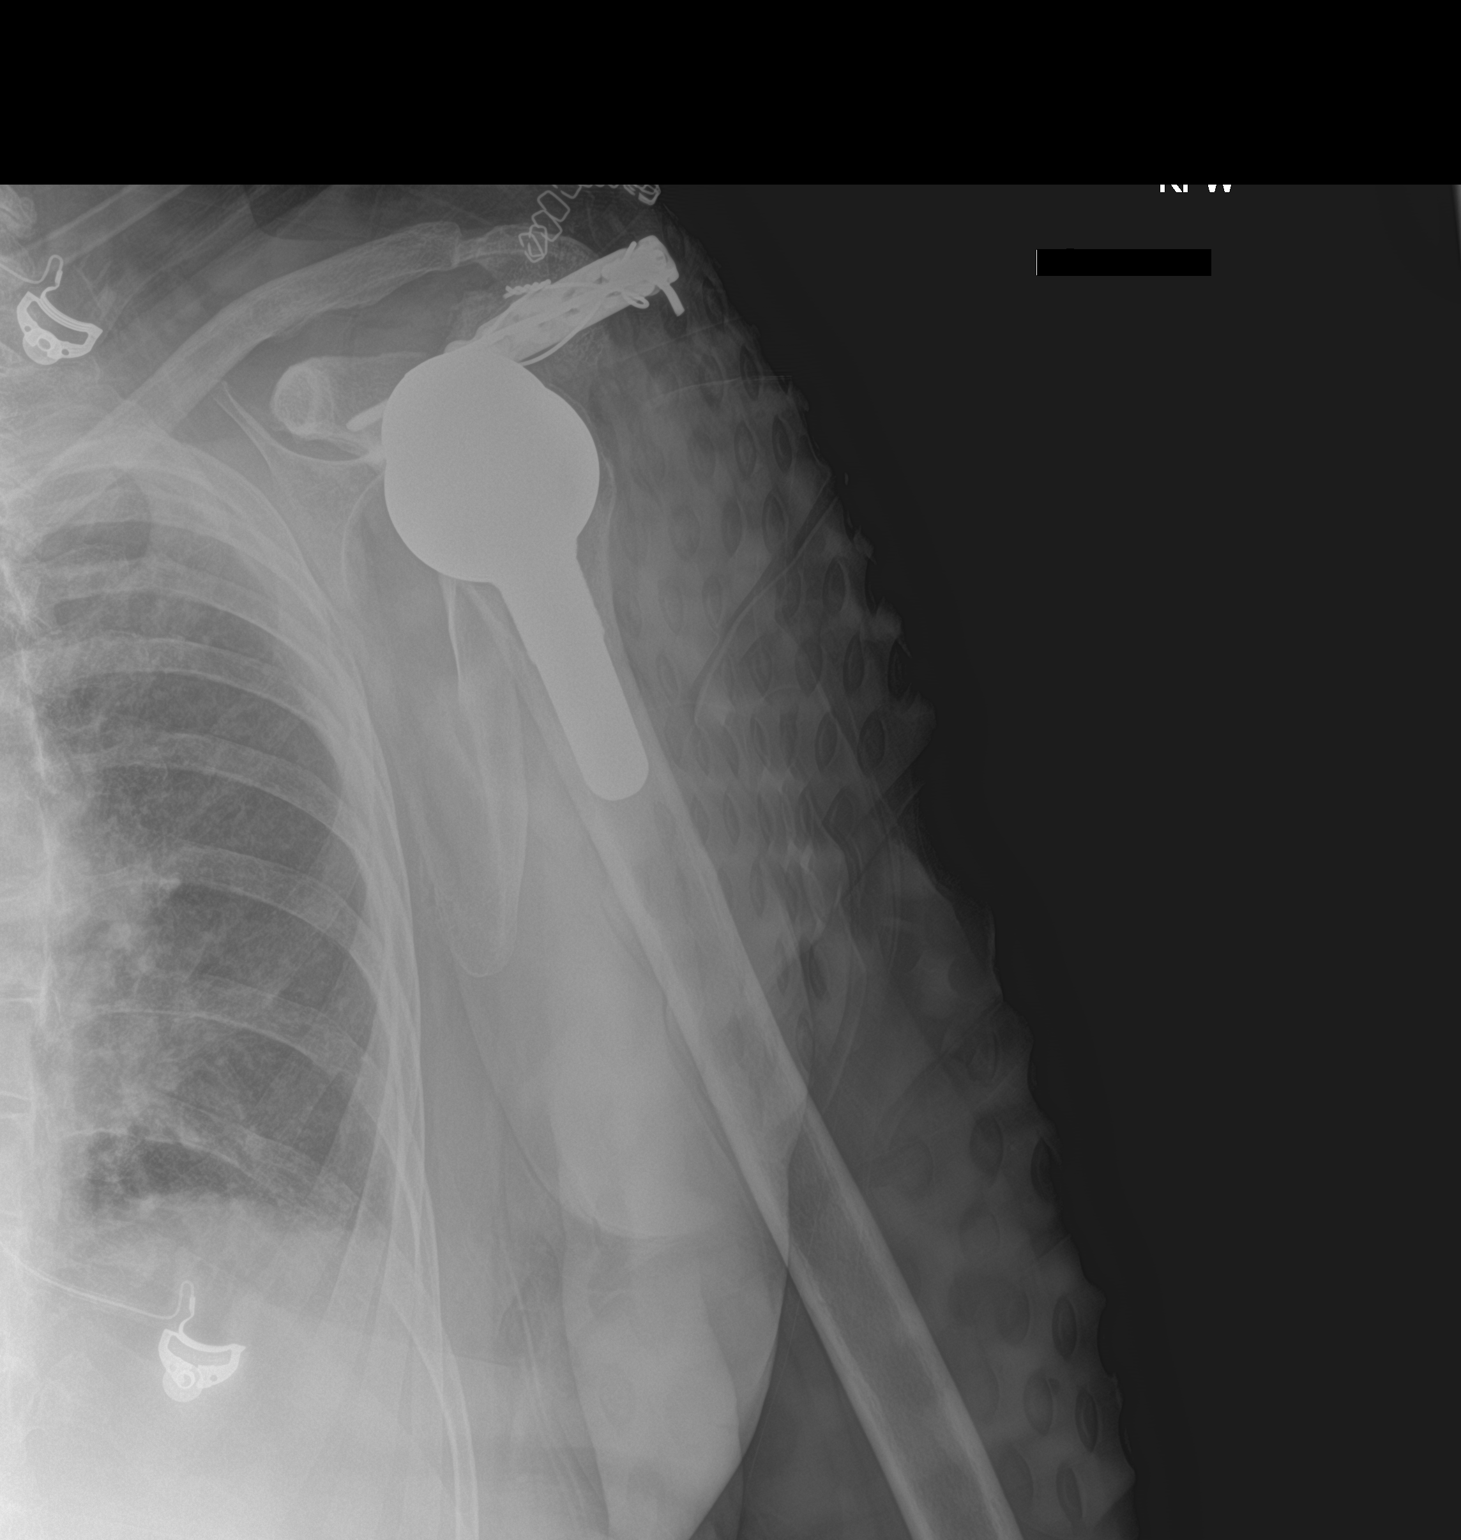

[shoulder ap (2 of 2)]
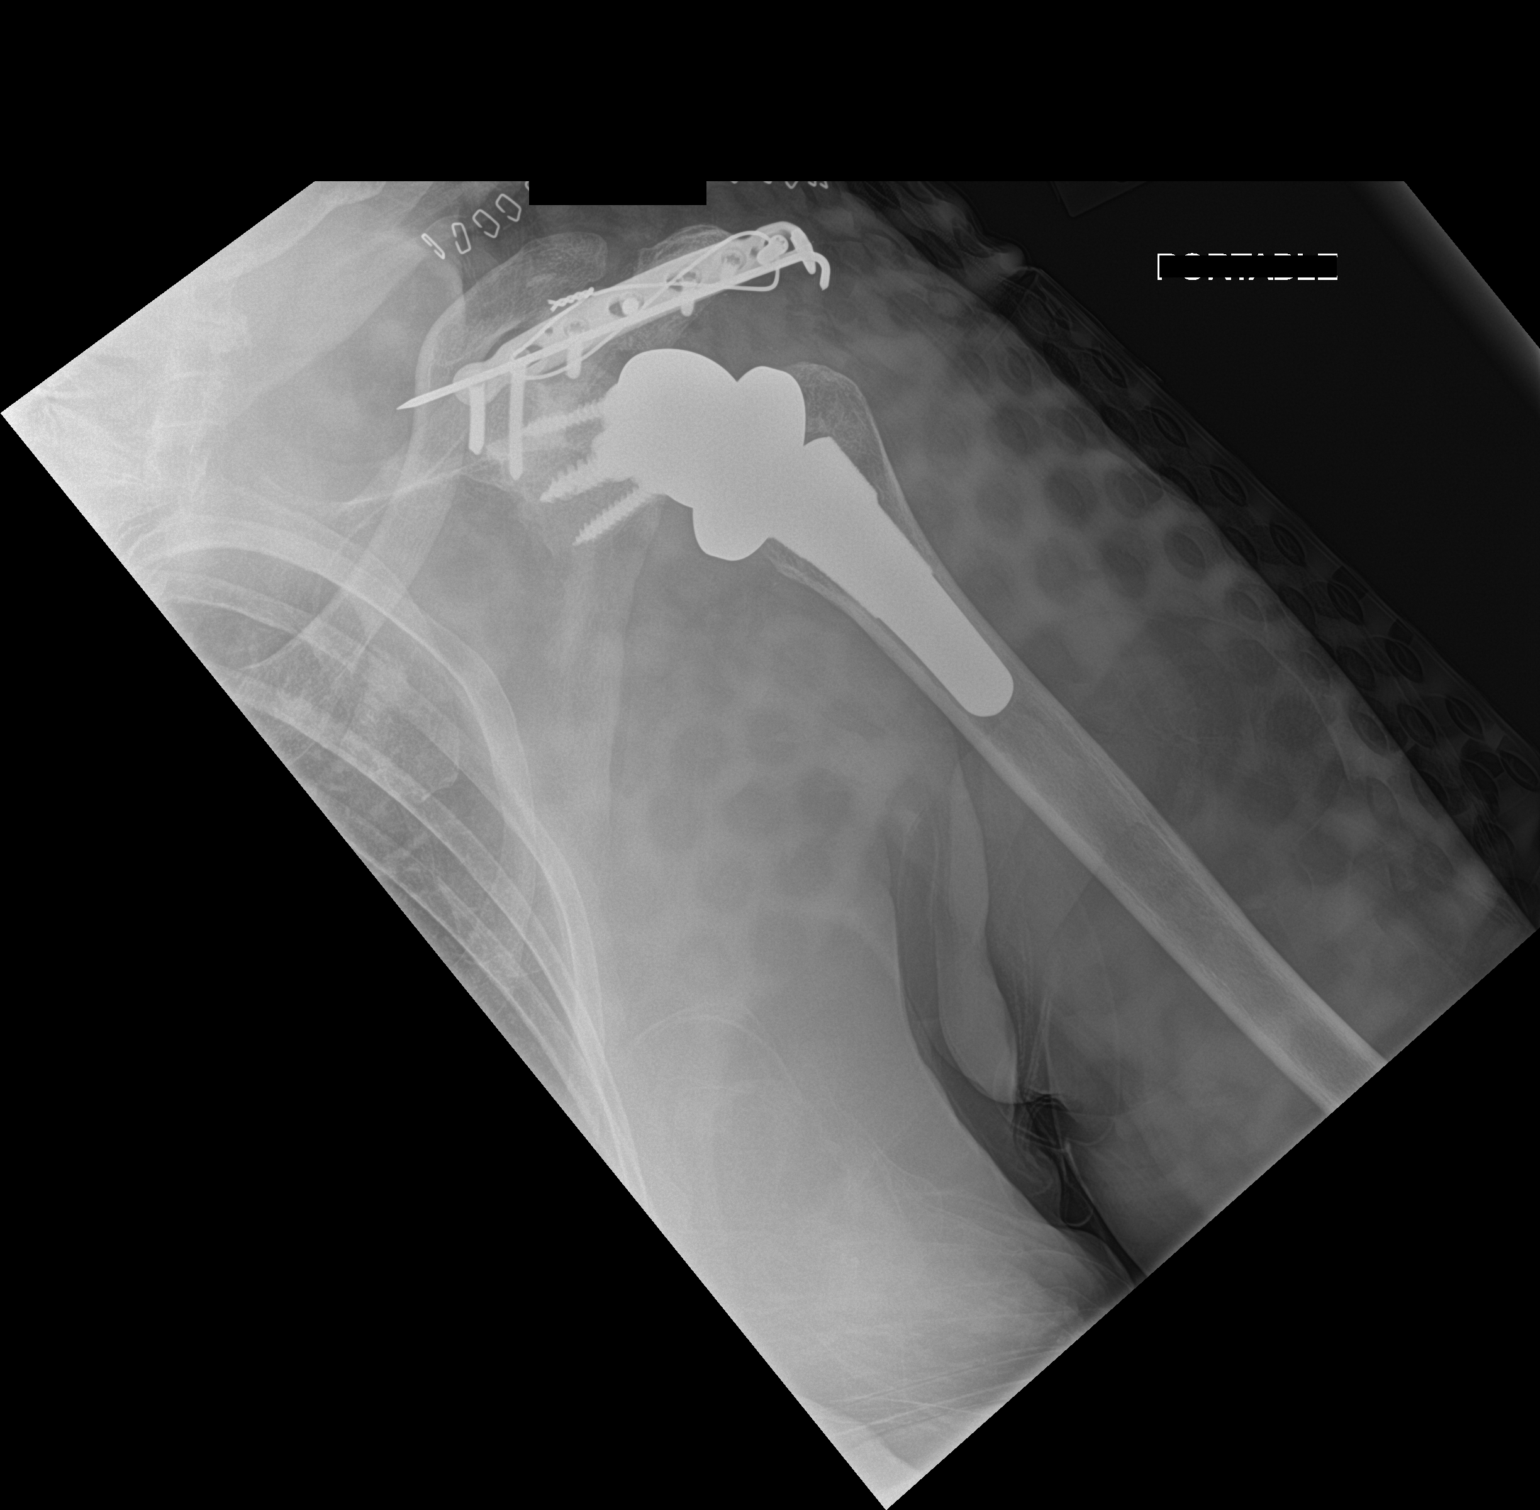

[2 of 2 positions shown; findings below may reference images not displayed]

FINDINGS: Prior left shoulder replacement. Plate and screw fixation noted
within the left scapula and acromion. No hardware complicating
feature.
IMPRESSION: Plate and screw fixation in the left scapula. No visible
complicating feature.

## 2020-12-19 IMAGING — XA DG C-ARM 1-60 MIN
3 series · 3 of 3 positions shown · non-contrast
Comparison: CT shoulder 09/04/2019

CLINICAL DATA: Nonunion of scapula, internal fixation

EXAM:
LEFT SHOULDER - 2+ VIEW; DG C-ARM 1-60 MIN

[Series 3: cont. · 1 of 1 slices shown (1 of 3)]
[im 1/1]
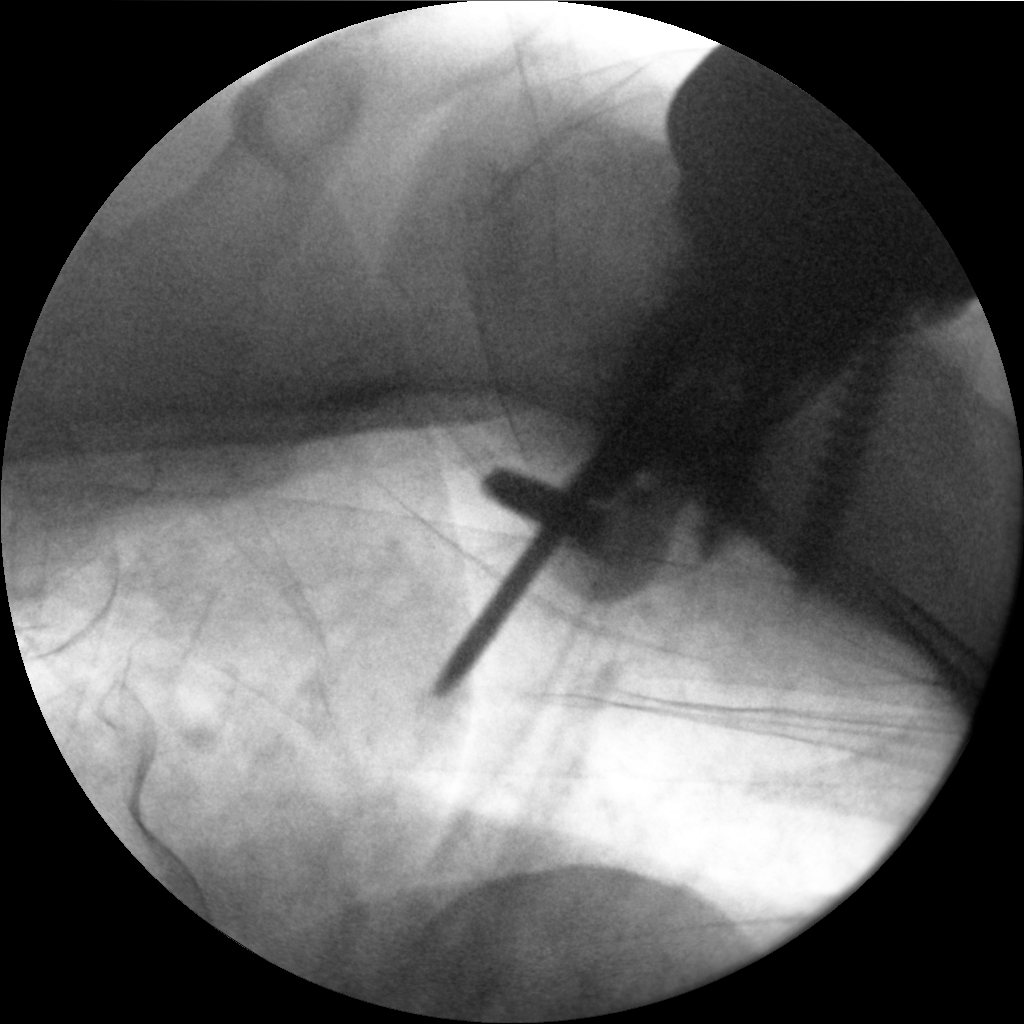

[Series 4: cont. · 1 of 1 slices shown (2 of 3)]
[im 1/1]
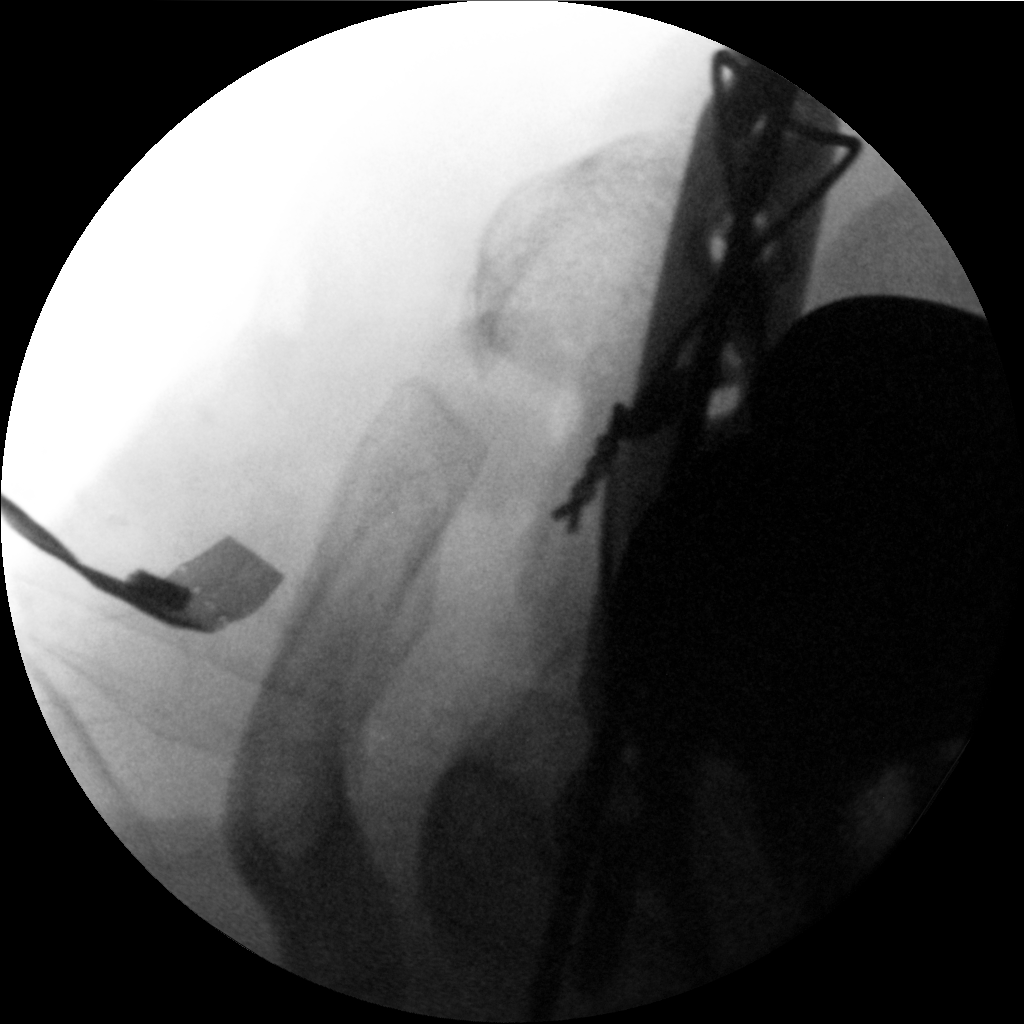

[Series 5: cont. · 1 of 1 slices shown (3 of 3)]
[im 1/1]
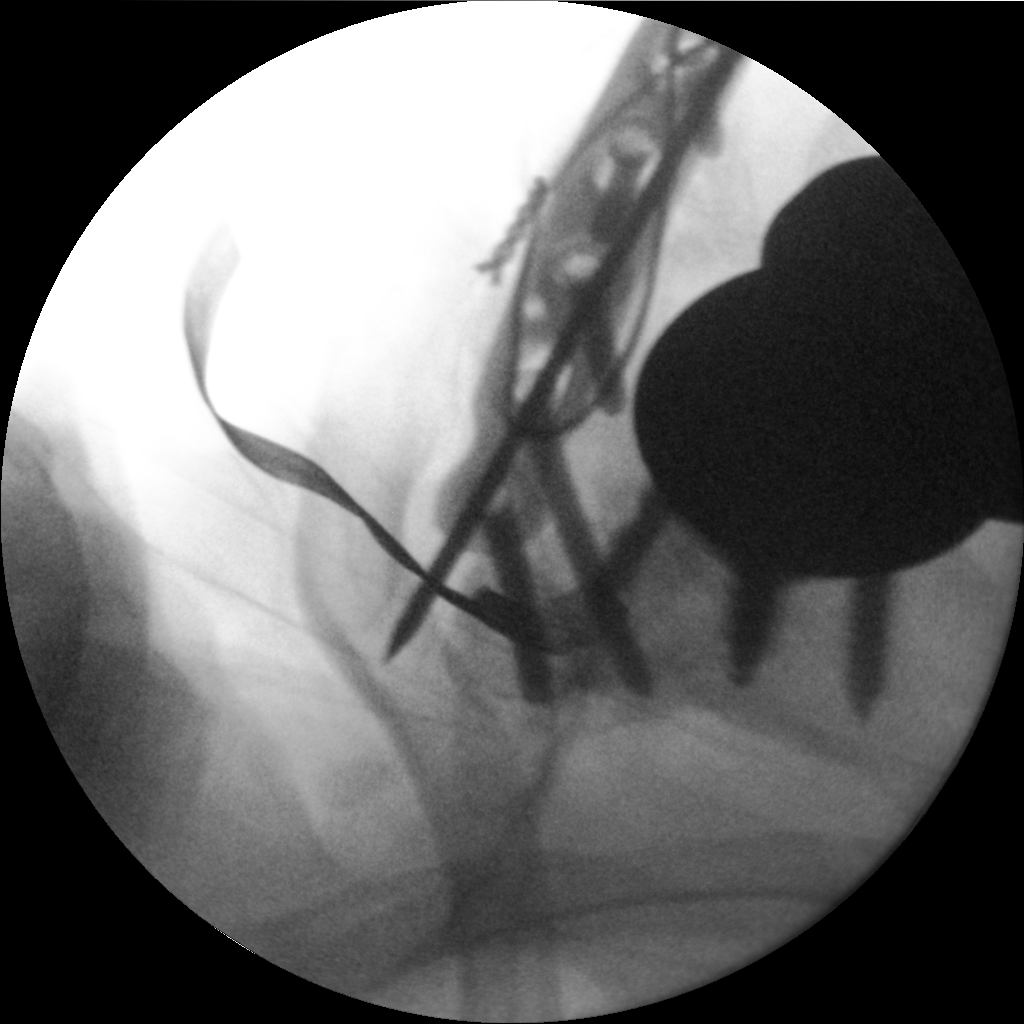

[3 of 3 positions shown; findings below may reference images not displayed]

FINDINGS: Intraoperative spot images are submitted demonstrating internal
fixation across the ununited scapular fracture at the base of the
scaphoid. No visible complicating feature.
IMPRESSION: Internal fixation.  No visible complicating feature.

## 2020-12-19 NOTE — Telephone Encounter (Signed)
Note from 11/22/20 needs to state as follows NO RX needed has already been sent.   For the wheelchair, not needs to address that the Patient can not use a cane or walker. Also if the Patient can safely propel herself or has a family member/caregiver that can push her.    For the hospital bed note needs to address that Patient needs to either have the head of the bed at 30 degrees or more or that the Patient needs repositioning that can not be done in a regular bed. They then need the diagnosis stated for why the Patient needs an elevated head ofd the bed or repositioning.

## 2020-12-19 NOTE — Telephone Encounter (Signed)
Note done  Still need arianna to help make a dot phrase for me have her see me 12/20/20

## 2020-12-24 DIAGNOSIS — Z7952 Long term (current) use of systemic steroids: Secondary | ICD-10-CM | POA: Diagnosis not present

## 2020-12-24 DIAGNOSIS — M810 Age-related osteoporosis without current pathological fracture: Secondary | ICD-10-CM | POA: Diagnosis not present

## 2020-12-24 DIAGNOSIS — N1832 Chronic kidney disease, stage 3b: Secondary | ICD-10-CM | POA: Diagnosis not present

## 2020-12-24 DIAGNOSIS — Z8781 Personal history of (healed) traumatic fracture: Secondary | ICD-10-CM | POA: Diagnosis not present

## 2020-12-25 ENCOUNTER — Telehealth: Payer: Self-pay

## 2020-12-25 NOTE — Telephone Encounter (Signed)
Transition Care Management Unsuccessful Follow-up Telephone Call  Date of discharge and from where:  12/13/2020   [facility]  Attempts:  2nd Attempt  Reason for unsuccessful TCM follow-up call:  No answer/busy Tomasa Rand, RN, BSN, CEN [place] Coordinator [phone]

## 2021-01-01 ENCOUNTER — Inpatient Hospital Stay: Payer: Medicare PPO | Attending: Oncology

## 2021-01-01 ENCOUNTER — Inpatient Hospital Stay: Payer: Medicare PPO | Admitting: Oncology

## 2021-01-02 ENCOUNTER — Encounter: Payer: Self-pay | Admitting: Oncology

## 2021-01-03 DIAGNOSIS — M4316 Spondylolisthesis, lumbar region: Secondary | ICD-10-CM | POA: Diagnosis not present

## 2021-01-03 DIAGNOSIS — Z96641 Presence of right artificial hip joint: Secondary | ICD-10-CM | POA: Diagnosis not present

## 2021-01-03 DIAGNOSIS — M5442 Lumbago with sciatica, left side: Secondary | ICD-10-CM | POA: Diagnosis not present

## 2021-01-03 DIAGNOSIS — M5116 Intervertebral disc disorders with radiculopathy, lumbar region: Secondary | ICD-10-CM | POA: Diagnosis not present

## 2021-01-03 DIAGNOSIS — M5441 Lumbago with sciatica, right side: Secondary | ICD-10-CM | POA: Diagnosis not present

## 2021-01-03 DIAGNOSIS — S76001A Unspecified injury of muscle, fascia and tendon of right hip, initial encounter: Secondary | ICD-10-CM | POA: Diagnosis not present

## 2021-01-12 ENCOUNTER — Observation Stay
Admission: EM | Admit: 2021-01-12 | Discharge: 2021-01-13 | Disposition: A | Discharge status: Home / Self Care | Payer: Medicare PPO | Attending: Student in an Organized Health Care Education/Training Program | Admitting: Student in an Organized Health Care Education/Training Program

## 2021-01-12 ENCOUNTER — Other Ambulatory Visit: Payer: Self-pay

## 2021-01-12 ENCOUNTER — Emergency Department: Payer: Medicare PPO

## 2021-01-12 ENCOUNTER — Observation Stay: Payer: Medicare PPO

## 2021-01-12 ENCOUNTER — Encounter: Payer: Self-pay | Admitting: Emergency Medicine

## 2021-01-12 DIAGNOSIS — J449 Chronic obstructive pulmonary disease, unspecified: Secondary | ICD-10-CM | POA: Insufficient documentation

## 2021-01-12 DIAGNOSIS — I10 Essential (primary) hypertension: Secondary | ICD-10-CM | POA: Diagnosis present

## 2021-01-12 DIAGNOSIS — J9621 Acute and chronic respiratory failure with hypoxia: Secondary | ICD-10-CM | POA: Diagnosis present

## 2021-01-12 DIAGNOSIS — Z96612 Presence of left artificial shoulder joint: Secondary | ICD-10-CM | POA: Diagnosis not present

## 2021-01-12 DIAGNOSIS — R059 Cough, unspecified: Secondary | ICD-10-CM | POA: Diagnosis not present

## 2021-01-12 DIAGNOSIS — J45909 Unspecified asthma, uncomplicated: Secondary | ICD-10-CM | POA: Diagnosis not present

## 2021-01-12 DIAGNOSIS — M79661 Pain in right lower leg: Secondary | ICD-10-CM | POA: Diagnosis not present

## 2021-01-12 DIAGNOSIS — J441 Chronic obstructive pulmonary disease with (acute) exacerbation: Secondary | ICD-10-CM | POA: Diagnosis not present

## 2021-01-12 DIAGNOSIS — M79662 Pain in left lower leg: Secondary | ICD-10-CM | POA: Diagnosis not present

## 2021-01-12 DIAGNOSIS — J9691 Respiratory failure, unspecified with hypoxia: Secondary | ICD-10-CM | POA: Diagnosis present

## 2021-01-12 DIAGNOSIS — Z79899 Other long term (current) drug therapy: Secondary | ICD-10-CM | POA: Insufficient documentation

## 2021-01-12 DIAGNOSIS — R0602 Shortness of breath: Principal | ICD-10-CM | POA: Insufficient documentation

## 2021-01-12 DIAGNOSIS — Z20822 Contact with and (suspected) exposure to covid-19: Secondary | ICD-10-CM | POA: Insufficient documentation

## 2021-01-12 DIAGNOSIS — I129 Hypertensive chronic kidney disease with stage 1 through stage 4 chronic kidney disease, or unspecified chronic kidney disease: Secondary | ICD-10-CM | POA: Insufficient documentation

## 2021-01-12 DIAGNOSIS — M79605 Pain in left leg: Secondary | ICD-10-CM

## 2021-01-12 DIAGNOSIS — N1832 Chronic kidney disease, stage 3b: Secondary | ICD-10-CM | POA: Diagnosis not present

## 2021-01-12 DIAGNOSIS — Z96651 Presence of right artificial knee joint: Secondary | ICD-10-CM | POA: Insufficient documentation

## 2021-01-12 DIAGNOSIS — Z96641 Presence of right artificial hip joint: Secondary | ICD-10-CM | POA: Insufficient documentation

## 2021-01-12 DIAGNOSIS — R042 Hemoptysis: Secondary | ICD-10-CM | POA: Diagnosis not present

## 2021-01-12 DIAGNOSIS — M79604 Pain in right leg: Secondary | ICD-10-CM

## 2021-01-12 DIAGNOSIS — Z87891 Personal history of nicotine dependence: Secondary | ICD-10-CM | POA: Insufficient documentation

## 2021-01-12 DIAGNOSIS — J9601 Acute respiratory failure with hypoxia: Secondary | ICD-10-CM | POA: Diagnosis present

## 2021-01-12 HISTORY — DX: Respiratory failure, unspecified with hypoxia: J96.91

## 2021-01-12 LAB — CBC
HCT: 42.1 % (ref 36.0–46.0)
HCT: 38.6 % (ref 36.0–46.0)
Hemoglobin: 13.3 g/dL (ref 12.0–15.0)
Hemoglobin: 12.3 g/dL (ref 12.0–15.0)
MCH: 28.2 pg (ref 26.0–34.0)
MCH: 28.3 pg (ref 26.0–34.0)
MCHC: 31.6 g/dL (ref 30.0–36.0)
MCHC: 31.9 g/dL (ref 30.0–36.0)
MCV: 89.2 fL (ref 80.0–100.0)
MCV: 88.7 fL (ref 80.0–100.0)
Platelets: 273 10*3/uL (ref 150–400)
Platelets: 267 10*3/uL (ref 150–400)
RBC: 4.72 MIL/uL (ref 3.87–5.11)
RBC: 4.35 MIL/uL (ref 3.87–5.11)
RDW: 15 % (ref 11.5–15.5)
RDW: 14.9 % (ref 11.5–15.5)
WBC: 15.9 10*3/uL — ABNORMAL HIGH (ref 4.0–10.5)
WBC: 14.1 10*3/uL — ABNORMAL HIGH (ref 4.0–10.5)
nRBC: 0 % (ref 0.0–0.2)
nRBC: 0 % (ref 0.0–0.2)

## 2021-01-12 LAB — RESP PANEL BY RT-PCR (FLU A&B, COVID) ARPGX2
Influenza A by PCR: NEGATIVE
Influenza B by PCR: NEGATIVE
SARS Coronavirus 2 by RT PCR: NEGATIVE

## 2021-01-12 LAB — CREATININE, SERUM
Creatinine, Ser: 1.29 mg/dL — ABNORMAL HIGH (ref 0.44–1.00)
GFR, Estimated: 42 mL/min — ABNORMAL LOW (ref >60)

## 2021-01-12 LAB — BASIC METABOLIC PANEL
Anion gap: 9 (ref 5–15)
BUN: 18 mg/dL (ref 8–23)
CO2: 26 mmol/L (ref 22–32)
Calcium: 8.9 mg/dL (ref 8.9–10.3)
Chloride: 96 mmol/L — ABNORMAL LOW (ref 98–111)
Creatinine, Ser: 1.36 mg/dL — ABNORMAL HIGH (ref 0.44–1.00)
GFR, Estimated: 40 mL/min — ABNORMAL LOW (ref >60)
Glucose, Bld: 178 mg/dL — ABNORMAL HIGH (ref 70–99)
Potassium: 3.7 mmol/L (ref 3.5–5.1)
Sodium: 131 mmol/L — ABNORMAL LOW (ref 135–145)

## 2021-01-12 LAB — BLOOD GAS, VENOUS
Acid-Base Excess: 6.1 mmol/L — ABNORMAL HIGH (ref 0.0–2.0)
Bicarbonate: 30.6 mmol/L — ABNORMAL HIGH (ref 20.0–28.0)
O2 Saturation: 89.5 %
Patient temperature: 37
pCO2, Ven: 43 mmHg — ABNORMAL LOW (ref 44.0–60.0)
pH, Ven: 7.46 — ABNORMAL HIGH (ref 7.250–7.430)
pO2, Ven: 54 mmHg — ABNORMAL HIGH (ref 32.0–45.0)

## 2021-01-12 LAB — D-DIMER, QUANTITATIVE: D-Dimer, Quant: 1.4 ug/mL-FEU — ABNORMAL HIGH (ref 0.00–0.50)

## 2021-01-12 MED ORDER — IPRATROPIUM-ALBUTEROL 0.5-2.5 (3) MG/3ML IN SOLN
3.0000 mL | Freq: Once | RESPIRATORY_TRACT | Status: AC
  Administered 2021-01-12: 3 mL via RESPIRATORY_TRACT
  Filled 2021-01-12: qty 3

## 2021-01-12 MED ORDER — HYDRALAZINE HCL 20 MG/ML IJ SOLN: 5.0000 mg | INTRAMUSCULAR | Status: DC | PRN

## 2021-01-12 MED ORDER — METHYLPREDNISOLONE SODIUM SUCC 125 MG IJ SOLR
60.0000 mg | Freq: Two times a day (BID) | INTRAMUSCULAR | Status: DC
  Administered 2021-01-13: 60 mg via INTRAVENOUS
  Filled 2021-01-12: qty 2

## 2021-01-12 MED ORDER — BUDESONIDE 0.25 MG/2ML IN SUSP
0.2500 mg | Freq: Every day | RESPIRATORY_TRACT | Status: DC
Start: 2021-01-13 — End: 2021-01-13
  Administered 2021-01-13: 0.25 mg via RESPIRATORY_TRACT
  Filled 2021-01-12: qty 2

## 2021-01-12 MED ORDER — MAGNESIUM SULFATE 2 GM/50ML IV SOLN
2.0000 g | Freq: Once | INTRAVENOUS | Status: AC
  Administered 2021-01-12: 2 g via INTRAVENOUS
  Filled 2021-01-12: qty 50

## 2021-01-12 MED ORDER — BENZONATATE 100 MG PO CAPS
200.0000 mg | ORAL_CAPSULE | Freq: Three times a day (TID) | ORAL | Status: DC | PRN
  Filled 2021-01-12: qty 2

## 2021-01-12 MED ORDER — ESCITALOPRAM OXALATE 10 MG PO TABS
10.0000 mg | ORAL_TABLET | Freq: Every day | ORAL | Status: DC
  Administered 2021-01-13: 10 mg via ORAL
  Filled 2021-01-12: qty 1

## 2021-01-12 MED ORDER — LEFLUNOMIDE 20 MG PO TABS
20.0000 mg | ORAL_TABLET | Freq: Every day | ORAL | Status: DC
  Administered 2021-01-13: 20 mg via ORAL
  Filled 2021-01-12: qty 1

## 2021-01-12 MED ORDER — AZITHROMYCIN 500 MG PO TABS
500.0000 mg | ORAL_TABLET | Freq: Every day | ORAL | Status: DC
  Administered 2021-01-13 (×2): 500 mg via ORAL
  Filled 2021-01-12 (×2): qty 1

## 2021-01-12 MED ORDER — IPRATROPIUM-ALBUTEROL 0.5-2.5 (3) MG/3ML IN SOLN
3.0000 mL | Freq: Four times a day (QID) | RESPIRATORY_TRACT | Status: DC
  Administered 2021-01-13: 3 mL via RESPIRATORY_TRACT
  Filled 2021-01-12: qty 3

## 2021-01-12 MED ORDER — MIRABEGRON ER 50 MG PO TB24
50.0000 mg | ORAL_TABLET | Freq: Every day | ORAL | Status: DC
  Administered 2021-01-13: 50 mg via ORAL
  Filled 2021-01-12: qty 1

## 2021-01-12 MED ORDER — HEPARIN SODIUM (PORCINE) 5000 UNIT/ML IJ SOLN
5000.0000 [IU] | Freq: Three times a day (TID) | INTRAMUSCULAR | Status: DC
  Filled 2021-01-12: qty 1

## 2021-01-12 MED ORDER — ALBUTEROL SULFATE (2.5 MG/3ML) 0.083% IN NEBU: 2.5000 mg | INHALATION_SOLUTION | Freq: Four times a day (QID) | RESPIRATORY_TRACT | Status: DC | PRN

## 2021-01-12 MED ORDER — SODIUM CHLORIDE 0.9% FLUSH
3.0000 mL | Freq: Two times a day (BID) | INTRAVENOUS | Status: DC
  Administered 2021-01-13 (×2): 3 mL via INTRAVENOUS

## 2021-01-12 MED ORDER — NEBIVOLOL HCL 5 MG PO TABS
5.0000 mg | ORAL_TABLET | Freq: Two times a day (BID) | ORAL | Status: DC
  Administered 2021-01-13: 5 mg via ORAL
  Filled 2021-01-12 (×2): qty 1

## 2021-01-12 MED ORDER — ACETAMINOPHEN 325 MG PO TABS: 650.0000 mg | ORAL_TABLET | Freq: Four times a day (QID) | ORAL | Status: DC | PRN

## 2021-01-12 MED ORDER — GUAIFENESIN ER 600 MG PO TB12
600.0000 mg | ORAL_TABLET | Freq: Two times a day (BID) | ORAL | Status: DC | PRN
  Administered 2021-01-13: 600 mg via ORAL
  Filled 2021-01-12: qty 1

## 2021-01-12 MED ORDER — MONTELUKAST SODIUM 10 MG PO TABS
10.0000 mg | ORAL_TABLET | Freq: Every day | ORAL | Status: DC
  Administered 2021-01-13: 10 mg via ORAL
  Filled 2021-01-12: qty 1

## 2021-01-12 MED ORDER — TRAMADOL HCL 50 MG PO TABS
50.0000 mg | ORAL_TABLET | Freq: Two times a day (BID) | ORAL | Status: DC | PRN
  Administered 2021-01-13: 50 mg via ORAL
  Filled 2021-01-12: qty 1

## 2021-01-12 MED ORDER — PRAVASTATIN SODIUM 20 MG PO TABS: 40.0000 mg | ORAL_TABLET | Freq: Every day | ORAL | Status: DC

## 2021-01-12 MED ORDER — FLUTICASONE PROPIONATE 50 MCG/ACT NA SUSP
2.0000 | Freq: Every day | NASAL | Status: DC
  Administered 2021-01-13: 2 via NASAL
  Filled 2021-01-12: qty 16

## 2021-01-12 MED ORDER — AMLODIPINE BESYLATE 5 MG PO TABS
2.5000 mg | ORAL_TABLET | Freq: Every day | ORAL | Status: DC
  Administered 2021-01-13: 2.5 mg via ORAL
  Filled 2021-01-12: qty 1

## 2021-01-12 MED ORDER — ONDANSETRON HCL 4 MG PO TABS: 4.0000 mg | ORAL_TABLET | Freq: Four times a day (QID) | ORAL | Status: DC | PRN

## 2021-01-12 MED ORDER — METHYLPREDNISOLONE SODIUM SUCC 125 MG IJ SOLR
125.0000 mg | Freq: Once | INTRAMUSCULAR | Status: AC
  Administered 2021-01-12: 125 mg via INTRAVENOUS
  Filled 2021-01-12: qty 2

## 2021-01-12 MED ORDER — LAMOTRIGINE 100 MG PO TABS
100.0000 mg | ORAL_TABLET | Freq: Two times a day (BID) | ORAL | Status: DC
  Administered 2021-01-13 (×2): 100 mg via ORAL
  Filled 2021-01-12 (×2): qty 1

## 2021-01-12 MED ORDER — ACETAMINOPHEN 650 MG RE SUPP: 650.0000 mg | Freq: Four times a day (QID) | RECTAL | Status: DC | PRN

## 2021-01-12 MED ORDER — PANTOPRAZOLE SODIUM 40 MG IV SOLR
40.0000 mg | INTRAVENOUS | Status: DC
  Administered 2021-01-13: 40 mg via INTRAVENOUS
  Filled 2021-01-12: qty 40

## 2021-01-12 MED ORDER — ONDANSETRON HCL 4 MG/2ML IJ SOLN: 4.0000 mg | Freq: Four times a day (QID) | INTRAMUSCULAR | Status: DC | PRN

## 2021-01-12 MED ORDER — PREDNISONE 20 MG PO TABS: 40.0000 mg | ORAL_TABLET | Freq: Every day | ORAL | Status: DC

## 2021-01-12 MED ORDER — QUETIAPINE FUMARATE 25 MG PO TABS
25.0000 mg | ORAL_TABLET | Freq: Every day | ORAL | Status: DC
  Administered 2021-01-13: 25 mg via ORAL
  Filled 2021-01-12: qty 1

## 2021-01-12 MED ORDER — LACTATED RINGERS IV SOLN: INTRAVENOUS | Status: AC

## 2021-01-12 NOTE — ED Notes (Signed)
IV team in room at this time.  

## 2021-01-12 NOTE — ED Provider Notes (Signed)
[facility] Emergency Department Provider Note   ____________________________________________    I have reviewed the triage vital signs and the nursing notes.   HISTORY  Chief Complaint Cough and Hemoptysis     HPI Kerri Carter is a 80 y.o. female with a history of COPD, wears 2 L of oxygen at night only, who presents with complaints of cough, episode of hemoptysis mixed with sputum earlier today, none since then.  She also reports increased shortness of breath especially with any exertion.  She reports this is been worsening over the last 3 days.  Has had cough with green sputum, no fever   Past Medical History:  Diagnosis Date   Anxiety    Aortic stenosis 07/09/2015   a.) TTE 07/06/2015: EF 55-60%; mild AS with MPG 13.0 mmHg.   Asthma    Bipolar disorder ([place])    C. difficile diarrhea 2010   a.) following ABX course during hospital admission   Carotid atherosclerosis, bilateral    a.) Moderate; < 50% stenosis BILATERAL ICAs.   Cataract    a.) s/p BILATERAL extraction   Chicken pox    CKD (chronic kidney disease), stage III (HCC)    a. s/p R nephrectomy./ aneurysm   Conversion disorder    COPD (chronic obstructive pulmonary disease) (HCC)    Depression    Essential hypertension    GERD (gastroesophageal reflux disease)    Heart murmur    Hyperlipidemia    ILD (interstitial lung disease) (HCC)    mild; 2/2 RA diagnosis   Inflammatory arthritis    a. hands/carpal tunnel.  b. Low titer rheumatoid factor. c. Negative anti-CCP antibodies. d. Plaquenil.   Macular degeneration    Nocturnal hypoxemia    Non-Obstructive CAD    a. 07/2009 Cath ([facility]): nonobs dzs;  b. 03/2011 Cath [facility] nonobs dzs.   Osteoarthritis    a. Knees.   PAD (peripheral artery disease) (HCC)    PUD (peptic ulcer disease)    S/P right hip fracture    11/01/16 s/p repair   Shoulder pain    Sleep apnea    no cpap / minimal   Spinal stenosis at L4-L5 level     severe with L4/L5 anterolisthesis grade 1 anterolisthesis    Toxic maculopathy    Valvular heart disease    a.) TTE 07/06/2015: EF 55-60%; Mild MR/AR/TR; Mild AS with MPG 13.0 mmHg.    Patient Active Problem List   Diagnosis Date Noted   Frequent falls 12/18/2020   Hip pain 12/18/2020   Acute pain of left knee 12/18/2020   Elevated brain natriuretic peptide (BNP) level 12/18/2020   Weakness of both lower extremities 12/18/2020   Assistance needed for mobility 12/18/2020   Impaired mobility 12/18/2020   Urinary incontinence 12/18/2020   Pleural effusion on left 12/18/2020   Lumbar radiculopathy 12/11/2020   COPD exacerbation ([place]) 10/29/2020   Acromial process of scapula fracture 12/21/2019   NSIP (nonspecific interstitial pneumonitis) ([place]) 12/08/2019   Iliopsoas bursitis of right hip 08/29/2019   Chronic left shoulder pain 08/29/2019   Overweight (BMI 25.0-29.9) 08/29/2019   Hypertriglyceridemia 07/24/2019   Bipolar disorder, in full remission, most recent episode depressed ([place]) 07/13/2019   Essential hypertension 06/16/2019   Chronic pain 06/16/2019   Lymphedema 06/05/2019   Chronic venous insufficiency 05/25/2019   PAD (peripheral artery disease) ([place]) 05/25/2019   Leg edema 05/02/2019   Episodic mood disorder ([place]) 80/99/8338   Complicated grief 25/06/3974   At high  risk for falls 03/16/2019   Pelvic mass in female 03/16/2019   Compression fracture of L4 vertebra ([place]) 02/21/2019   Collapsed vertebra, not elsewhere classified, lumbar region, initial encounter for fracture ([place]) 02/21/2019   Anxiety 01/20/2019   Pain due to onychomycosis of toenails of both feet 12/05/2018   Hav (hallux abducto valgus), unspecified laterality 12/05/2018   Closed fracture of acromial process of scapula 10/28/2018   Status post reverse arthroplasty of shoulder, left 07/26/2018   Interstitial pulmonary disease ([place]) 07/07/2018   Fatigue 07/07/2018   Avascular necrosis of left shoulder  due to adverse effect of steroid therapy ([place]) 01/20/2018   Other shoulder lesions, left shoulder 12/20/2017   Status post reverse total shoulder replacement, right 11/04/2017   Leukocytosis 10/19/2017   Allergic rhinitis 09/28/2017   Rotator cuff tendinitis, right 07/26/2017   Strain of right hip 02/26/2017   History of fracture of left hip 01/15/2017   Status post lumbar spinal fusion 01/15/2017   Dislocation of hip joint prosthesis ([place]) 01/08/2017   Status post hip hemiarthroplasty 01/08/2017   Leg swelling 12/15/2016   Age-related osteoporosis without current pathological fracture 11/05/2016   Hip fracture ([place]) 10/31/2016   Polyneuropathy 10/28/2016   Left foot pain 10/19/2016   Spinal stenosis of lumbar region 09/15/2016   Osteoporosis 08/27/2016   Drug-induced osteoporosis 08/27/2016   Adnexal mass 08/25/2016   Prediabetes 08/25/2016   Coronary atherosclerosis 08/25/2016   History of syncope 08/25/2016   Hypokalemia 08/25/2016   Mixed bipolar I disorder ([place]) 10/09/2015   Stage 3b chronic kidney disease ([place])    Conversion disorder 08/04/2015   Hyperlipidemia 07/17/2015   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   Macular degeneration 07/17/2015   Insomnia 07/17/2015   Rheumatoid arthritis ([place]) 12/05/2014   Other specified postprocedural states 07/20/2013   Anxiety and depression 06/13/2013   Osteoarthritis of knee 06/07/2013   Cough 05/02/2012   SOB (shortness of breath) on exertion 10/13/2011   Valvular heart disease 10/13/2011   Asthma-COPD overlap syndrome ([place]) 09/09/2011   OSA (obstructive sleep apnea) 09/09/2011   Nocturnal hypoxemia 09/09/2011   Gastroesophageal reflux disease 02/24/2011   Single kidney 02/24/2011   H/O cardiac catheterization 02/24/2011   Renal artery stenosis ([place]) 02/24/2011    Past Surgical History:  Procedure Laterality Date   APPENDECTOMY     BACK SURGERY     lumbar fusion   BUNIONECTOMY Right     CATARACT EXTRACTION, BILATERAL     CESAREAN SECTION     x1   CHOLECYSTECTOMY N/A 05/11/2016   Procedure: LAPAROSCOPIC CHOLECYSTECTOMY;  Surgeon: Florene Glen, MD;  Location: [facility] ORS;  Service: General;  Laterality: N/A;   COLONOSCOPY WITH PROPOFOL N/A 04/02/2016   Procedure: COLONOSCOPY WITH PROPOFOL;  Surgeon: Jonathon Bellows, MD;  Location: [facility] ENDOSCOPY;  Service: Endoscopy;  Laterality: N/A;   ENDOSCOPIC RETROGRADE CHOLANGIOPANCREATOGRAPHY (ERCP) WITH PROPOFOL N/A 05/08/2016   Procedure: ENDOSCOPIC RETROGRADE CHOLANGIOPANCREATOGRAPHY (ERCP) WITH PROPOFOL;  Surgeon: Lucilla Lame, MD;  Location: [facility] ENDOSCOPY;  Service: Endoscopy;  Laterality: N/A;   ERCP     with biliary spincterotomy 05/08/16 Dr. Allen Norris for choledocholithiasis    ESOPHAGEAL DILATION  04/02/2016   Procedure: ESOPHAGEAL DILATION;  Surgeon: Jonathon Bellows, MD;  Location: [facility] ENDOSCOPY;  Service: Endoscopy;;   ESOPHAGOGASTRODUODENOSCOPY (EGD) WITH PROPOFOL N/A 04/02/2016   Procedure: ESOPHAGOGASTRODUODENOSCOPY (EGD) WITH PROPOFOL;  Surgeon: Jonathon Bellows, MD;  Location: [facility] ENDOSCOPY;  Service: Endoscopy;  Laterality: N/A;   HIP ARTHROPLASTY Right 11/01/2016   Procedure: ARTHROPLASTY BIPOLAR  HIP (HEMIARTHROPLASTY);  Surgeon: Corky Mull, MD;  Location: [facility] ORS;  Service: Orthopedics;  Laterality: Right;   LUMBAR LAMINECTOMY/DECOMPRESSION MICRODISCECTOMY N/A 12/11/2020   Procedure: LEFT L2-3 MICRODISCECTOMY, L3-4 AND L5-S1 DECOMPRESSION;  Surgeon: Meade Maw, MD;  Location: [facility] ORS;  Service: Neurosurgery;  Laterality: N/A;   NEPHRECTOMY  1988   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   ORIF SCAPHOID FRACTURE Left 12/21/2019   Procedure: OPEN REDUCTION INTERNAL FIXATION (ORIF) OF LEFT SCAPULAR NONUNION WITH BONE GRAFT;  Surgeon: Corky Mull, MD;  Location: [facility] ORS;  Service: Orthopedics;  Laterality: Left;   osteoporosis     noted DEXA 08/19/16    REPLACEMENT TOTAL KNEE Right    REVERSE SHOULDER  ARTHROPLASTY Right 11/04/2017   Procedure: REVERSE SHOULDER ARTHROPLASTY;  Surgeon: Corky Mull, MD;  Location: [facility] ORS;  Service: Orthopedics;  Laterality: Right;   REVERSE SHOULDER ARTHROPLASTY Left 07/26/2018   Procedure: REVERSE SHOULDER ARTHROPLASTY;  Surgeon: Corky Mull, MD;  Location: [facility] ORS;  Service: Orthopedics;  Laterality: Left;   TONSILLECTOMY     TOTAL HIP ARTHROPLASTY  12/10/11   [facility] left hip   TOTAL HIP ARTHROPLASTY Bilateral    TUBAL LIGATION      Prior to Admission medications   Medication Sig Start Date End Date Taking? Authorizing Provider  Adalimumab 40 MG/0.4ML PNKT Inject 40 mg into the skin every 14 (fourteen) days.    [provider]  albuterol (VENTOLIN HFA) 108 (90 Base) MCG/ACT inhaler Inhale 2 puffs into the lungs every 6 (six) hours as needed for wheezing or shortness of breath. 09/03/20   McLean-Scocuzza, Nino Glow, MD  ALPRAZolam (XANAX) 0.25 MG tablet TAKE 1 TABLET BY MOUTH AT BEDTIME AS NEEDED FOR ANXIETY. 12/13/20   Loleta Dicker, PA  amLODipine (NORVASC) 2.5 MG tablet Take 1 tablet (2.5 mg total) by mouth daily. 09/03/20   McLean-Scocuzza, Nino Glow, MD  benzonatate (TESSALON) 200 MG capsule Take 1 capsule (200 mg total) by mouth 3 (three) times daily as needed for cough. 09/03/20   McLean-Scocuzza, Nino Glow, MD  budesonide (PULMICORT) 0.25 MG/2ML nebulizer solution USE 1 VIAL  IN  NEBULIZER TWICE  DAILY - rinse mouth after treatment 12/01/19   [name], MD  cyclobenzaprine (FLEXERIL) 10 MG tablet Take 1 tablet (10 mg total) by mouth 3 (three) times daily as needed for muscle spasms. 11/04/20   Kathie Dike, MD  dicyclomine (BENTYL) 10 MG capsule Take 1 capsule (10 mg total) by mouth 2 (two) times daily as needed for spasms. 10/01/20 09/26/21  Jonathon Bellows, MD  escitalopram (LEXAPRO) 10 MG tablet TAKE 1 TABLET BY MOUTH EVERY DAY 09/13/20   McLean-Scocuzza, Nino Glow, MD  famotidine (PEPCID) 20 MG tablet TAKE 1 TABLET (20 MG TOTAL) BY MOUTH  DAILY. BEFORE BREAKFAST OR DINNER 05/23/20   McLean-Scocuzza, Nino Glow, MD  fluticasone (FLONASE) 50 MCG/ACT nasal spray Place 2 sprays into both nostrils daily. In am 12/08/19   McLean-Scocuzza, Nino Glow, MD  gabapentin (NEURONTIN) 300 MG capsule Take 2 capsules (600 mg total) by mouth in the morning and at bedtime. 09/03/20   McLean-Scocuzza, Nino Glow, MD  guaiFENesin (MUCINEX) 600 MG 12 hr tablet Take 1 tablet (600 mg total) by mouth 2 (two) times daily. 11/04/20   Kathie Dike, MD  ipratropium-albuterol (DUONEB) 0.5-2.5 (3) MG/3ML SOLN TAKE 3 MLS BY NEBULIZATION 3 (THREE) TIMES DAILY AS NEEDED. Patient taking differently: Take 3 mLs by nebulization 2 (two) times daily. 07/29/20  [name], MD  lamoTRIgine (LAMICTAL) 100 MG tablet TAKE 1 TAB 2 TIMES DAILY. FURTHER REFILLS NEW PSYCH FOR ALL PSYCH MEDS ONLY TEMP SUPPLY FROM PCP 09/03/20   McLean-Scocuzza, Nino Glow, MD  leflunomide (ARAVA) 20 MG tablet Take 1 tablet (20 mg total) by mouth daily. 09/30/17   McLean-Scocuzza, Nino Glow, MD  lidocaine (LIDODERM) 5 % Place 1 patch onto the skin 2 (two) times daily as needed. Remove & Discard patch within 12 hours or as directed by MD 11/22/20   McLean-Scocuzza, Nino Glow, MD  lovastatin (MEVACOR) 20 MG tablet TAKE 1 TABLET BY MOUTH EVERYDAY AT BEDTIME *STOP TALKING 40MG * 07/12/20   McLean-Scocuzza, Nino Glow, MD  mirabegron ER (MYRBETRIQ) 50 MG TB24 tablet Take 1 tablet (50 mg total) by mouth daily. 12/04/20 11/29/21  Vaillancourt, Aldona Bar, PA-C  montelukast (SINGULAIR) 10 MG tablet TAKE 1 TABLET BY MOUTH EVERY DAY 08/09/20   McLean-Scocuzza, Nino Glow, MD  nebivolol (BYSTOLIC) 10 MG tablet Take 0.5 tablets (5 mg total) by mouth in the morning and at bedtime. 12/08/19   McLean-Scocuzza, Nino Glow, MD  pantoprazole (PROTONIX) 40 MG tablet TAKE 1 TABLET BY MOUTH 2 TIMES DAILY 30 MIN BEFORE FOOD (NOTE REDUCTION IN FREQUENCY) 07/02/20   McLean-Scocuzza, Nino Glow, MD  predniSONE (DELTASONE) 5 MG tablet Take 40mg  po daily for 2  days then 30mg  daily for 2 days then 20mg  daily for 2 days then 10mg  daily for 2 days then 7.5mg  daily 11/04/20   Kathie Dike, MD  QUEtiapine (SEROQUEL) 25 MG tablet TAKE 1 TABLET (25 MG TOTAL) BY MOUTH AT BEDTIME. AGAIN LAST FILL FURTHER REFILLS FROM PSYCHIATRY NO EXCEPTIONS 09/13/20   McLean-Scocuzza, Nino Glow, MD  senna-docusate (SENOKOT-S) 8.6-50 MG tablet Take 1 tablet by mouth at bedtime as needed for mild constipation. 12/13/20   Loleta Dicker, PA  sucralfate (CARAFATE) 1 g tablet Take 1 tablet (1 g total) by mouth 2 (two) times daily. 09/17/20 09/12/21  Jonathon Bellows, MD  zoledronic acid (RECLAST) 5 MG/100ML SOLN injection Inject 5 mg into the vein once.    [provider]     Allergies Ceftin [cefuroxime axetil]; Lisinopril; Sulfa antibiotics; Sulfasalazine; Morphine; Xarelto [rivaroxaban]; Adhesive [tape]; Antihistamines, chlorpheniramine-type; Antivert [meclizine hcl]; Aspirin; Contrast media [iodinated diagnostic agents]; Decongestant [pseudoephedrine hcl]; Doxycycline; Levaquin [levofloxacin in d5w]; Polymyxin b; and Tetanus toxoids  Family History  Problem Relation Age of Onset   Rheum arthritis Mother    Asthma Mother    Parkinson's disease Mother    Heart disease Mother    Stroke Mother    Hypertension Mother    Heart attack Father    Heart disease Father    Hypertension Father    Peripheral Artery Disease Father    Diabetes Son    Gout Son    Asthma Sister    Heart disease Sister    Lung cancer Sister    Heart disease Sister    Heart disease Sister    Breast cancer Sister    Heart attack Sister    Heart disease Brother    Heart disease Maternal Grandmother    Diabetes Maternal Grandmother    Colon cancer Maternal Grandmother    Cancer Maternal Grandmother        Hodgkins lymphoma   Heart disease Brother    Alcohol abuse Brother    Depression Brother    Dementia Son     Social History Social History   Tobacco Use   Smoking status: Former  Packs/day: 0.50    Years: 20.00    Pack years: 10.00    Types: Cigarettes    Quit date: 02/02/1974    Years since quitting: 46.9   Smokeless tobacco: Never  Vaping Use   Vaping Use: Never used  Substance Use Topics   Alcohol use: No   Drug use: No    Review of Systems  Constitutional: No fever/chills Eyes: No visual changes.  ENT: No sore throat. Cardiovascular: Denies chest pain. Respiratory: As above Gastrointestinal: No abdominal pain.  No nausea, no vomiting.   Genitourinary: Negative for dysuria. Musculoskeletal: Negative for back pain. Skin: Negative for rash. Neurological: Negative for headaches or weakness   ____________________________________________   PHYSICAL EXAM:  VITAL SIGNS: ED Triage Vitals  Enc Vitals Group     BP 01/12/21 1318 132/70     Pulse Rate 01/12/21 1318 (!) 119     Resp 01/12/21 1318 17     Temp 01/12/21 1318 98.4 F (36.9 C)     Temp Source 01/12/21 1318 Oral     SpO2 01/12/21 1318 (!) 88 %     Weight 01/12/21 1320 81.6 kg (180 lb)     Height 01/12/21 1320 1.549 m (5\' 1" )     Head Circumference --      Peak Flow --      Pain Score 01/12/21 1319 0     Pain Loc --      Pain Edu? --      Excl. in [place]? --     Constitutional: Alert and oriented.  Eyes: Conjunctivae are normal.   Nose: No congestion/rhinnorhea. Mouth/Throat: Mucous membranes are moist.    Cardiovascular: Normal rate, regular rhythm. Grossly normal heart sounds.  Good peripheral circulation. Respiratory: Increased respiratory effort with mild tachypnea, poor air movement, scattered wheezes Gastrointestinal: Soft and nontender. No distention.   Genitourinary: deferred Musculoskeletal: No lower extremity tenderness nor edema.  Warm and well perfused Neurologic:  Normal speech and language. No gross focal neurologic deficits are appreciated.  Skin:  Skin is warm, dry and intact. No rash noted. Psychiatric: Mood and affect are normal. Speech and behavior are  normal.  ____________________________________________   LABS (all labs ordered are listed, but only abnormal results are displayed)  Labs Reviewed  CBC - Abnormal; Notable for the following components:      Result Value   WBC 15.9 (*)    All other components within normal limits  BASIC METABOLIC PANEL - Abnormal; Notable for the following components:   Sodium 131 (*)    Chloride 96 (*)    Glucose, Bld 178 (*)    Creatinine, Ser 1.36 (*)    GFR, Estimated 40 (*)    All other components within normal limits  RESP PANEL BY RT-PCR (FLU A&B, COVID) [id number]   ____________________________________________  EKG  ED ECG REPORT I, Lavonia Drafts, the attending physician, personally viewed and interpreted this ECG.  Date: 01/12/2021  Rhythm: Sinus tachycardia QRS Axis: normal Intervals: normal ST/T Wave abnormalities: normal Narrative Interpretation: no evidence of acute ischemia  ____________________________________________  RADIOLOGY  Chest x-ray reviewed by me, no acute abnormality ____________________________________________   PROCEDURES  Procedure(s) performed: No  Procedures   Critical Care performed: yes  CRITICAL CARE Performed by: Lavonia Drafts   Total critical care time: 30 minutes  Critical care time was exclusive of separately billable procedures and treating other patients.  Critical care was necessary to treat or prevent imminent or life-threatening deterioration.  Critical care was time spent  personally by me on the following activities: development of treatment plan with patient and/or surrogate as well as nursing, discussions with consultants, evaluation of patient's response to treatment, examination of patient, obtaining history from patient or surrogate, ordering and performing treatments and interventions, ordering and review of laboratory studies, ordering and review of radiographic studies, pulse oximetry and re-evaluation of patient's  condition.  ____________________________________________   INITIAL IMPRESSION / ASSESSMENT AND PLAN / ED COURSE  Pertinent labs & imaging results that were available during my care of the patient were reviewed by me and considered in my medical decision making (see chart for details).   Patient presents with shortness of breath, episode of cough with sputum mixed with small amount of blood, earlier today, no further episodes.  Suspect upper respiratory infection, worsening COPD.  88% on room air upon arrival which is atypical for her, placed on 2 L nasal cannula here.  Lab work notable for mild elevation of white blood cell count of 15.9, this is nonspecific given no evidence of pneumonia, likely related to the fact that she is on prednisone current.  She is tachycardic and tachypneic  Will treat with duo nebs, IV Solu-Medrol, magnesium  ----------------------------------------- 10:10 PM on 01/12/2021 ----------------------------------------- Patient with only minimal improvement, will require admission, have discussed with Dr. Posey Pronto of the hospitalist service    ____________________________________________   FINAL CLINICAL IMPRESSION(S) / ED DIAGNOSES  Final diagnoses:  COPD exacerbation ([place])        Note:  This document was prepared using Dragon voice recognition software and may include unintentional dictation errors.    Lavonia Drafts, MD 01/12/21 2210

## 2021-01-12 NOTE — ED Notes (Signed)
This RN attempted IV start 2X, requested assistance from [employer].

## 2021-01-12 NOTE — ED Notes (Signed)
Attempted to start IV x2 with no success.

## 2021-01-12 NOTE — ED Triage Notes (Signed)
Pt in via POV, reports cough x few days, now coughing up blood.  Also reports some intermittent nausea x 1 day.  Cough very congested, reports thick green sputum in addition to blood.    88-89% on RA, states she wears oxygen PRN at home.  This RN placed her on 2L nasal cannula.

## 2021-01-12 NOTE — H&P (Signed)
History and Physical    Kerri Carter SVX:[id number] DOB: Oct 20, 1940 DOA: 01/12/2021  PCP: McLean-Scocuzza, Nino Glow, MD    Patient coming from:  Home.  Chief Complaint:  Cough, shortness of breath.   HPI:  Kerri Carter is a 80 y.o. female seen in ed with complaints of cough, shortness of breath, for the past 3 days.  Symptoms worsening today and had specks of blood in her sputum.  Patient also noted that the shortness of breath was progressively getting worse.  Patient denies any headaches blurred vision speech or gait issues chest pain palpitations fevers or chills.  No abdominal pain diarrhea bowel movement changes or any urinary complaints.  Pt has past medical history of allergies to multiple medications please review the list of 16 items. Patient also has past medical history of anxiety, aortic stenosis, asthma, chronic kidney disease, CVA, depression, GERD/ PUD , retention, and interstitial lung disease, PAD.  ED Course:  Vitals:   01/12/21 1318 01/12/21 1320 01/12/21 1853 01/12/21 2030  BP: 132/70  (!) 145/77 (!) 146/65  Pulse: (!) 119  (!) 118 (!) 102  Resp: 17  (!) 24 12  Temp: 98.4 F (36.9 C)     TempSrc: Oral     SpO2: (!) 88%  97% 95%  Weight:  81.6 kg    Height:  5\' 1"  (1.549 m)    In the emergency room patient is tachycardic and hypoxic.  With O2 sats of 88% improved on 2 L nasal cannula. Patient meets SIRS criteria, with tachycardia and respiratory rate. Blood work shows glucose of 178 creatinine of 1.36 which is improved from previous creatinine, GFR 40. CBC shows white count of 15.9 hemoglobin 13.3 with platelets of 272. Respiratory panel negative for flu and COVID. In the emergency room patient received DuoNeb treatments x2 Solu-Medrol. Initial EKG shows sinus tachycardia at 117.  Review of Systems:  Review of Systems  Respiratory:  Positive for cough, hemoptysis, sputum production, shortness of breath and wheezing.   Cardiovascular:  Negative for chest  pain.  Musculoskeletal:        Bl leg pain. Travel to [place] . Dimer pending. ? PE. Will get v/q if dimer positive with pt's sob.   All other systems reviewed and are negative.   Past Medical History:  Diagnosis Date   Anxiety    Aortic stenosis 07/09/2015   a.) TTE 07/06/2015: EF 55-60%; mild AS with MPG 13.0 mmHg.   Asthma    Bipolar disorder ([place])    C. difficile diarrhea 2010   a.) following ABX course during hospital admission   Carotid atherosclerosis, bilateral    a.) Moderate; < 50% stenosis BILATERAL ICAs.   Cataract    a.) s/p BILATERAL extraction   Chicken pox    CKD (chronic kidney disease), stage III (HCC)    a. s/p R nephrectomy./ aneurysm   Conversion disorder    COPD (chronic obstructive pulmonary disease) (HCC)    Depression    Essential hypertension    GERD (gastroesophageal reflux disease)    Heart murmur    Hyperlipidemia    ILD (interstitial lung disease) (HCC)    mild; 2/2 RA diagnosis   Inflammatory arthritis    a. hands/carpal tunnel.  b. Low titer rheumatoid factor. c. Negative anti-CCP antibodies. d. Plaquenil.   Macular degeneration    Nocturnal hypoxemia    Non-Obstructive CAD    a. 07/2009 Cath ([facility]): nonobs dzs;  b. 03/2011 Cath [facility]): nonobs dzs.   Osteoarthritis  a. Knees.   PAD (peripheral artery disease) (HCC)    PUD (peptic ulcer disease)    S/P right hip fracture    11/01/16 s/p repair   Shoulder pain    Sleep apnea    no cpap / minimal   Spinal stenosis at L4-L5 level    severe with L4/L5 anterolisthesis grade 1 anterolisthesis    Toxic maculopathy    Valvular heart disease    a.) TTE 07/06/2015: EF 55-60%; Mild MR/AR/TR; Mild AS with MPG 13.0 mmHg.    Past Surgical History:  Procedure Laterality Date   APPENDECTOMY     BACK SURGERY     lumbar fusion   BUNIONECTOMY Right    CATARACT EXTRACTION, BILATERAL     CESAREAN SECTION     x1   CHOLECYSTECTOMY N/A 05/11/2016   Procedure: LAPAROSCOPIC CHOLECYSTECTOMY;  Surgeon:  Florene Glen, MD;  Location: [facility] ORS;  Service: General;  Laterality: N/A;   COLONOSCOPY WITH PROPOFOL N/A 04/02/2016   Procedure: COLONOSCOPY WITH PROPOFOL;  Surgeon: Jonathon Bellows, MD;  Location: [facility] ENDOSCOPY;  Service: Endoscopy;  Laterality: N/A;   ENDOSCOPIC RETROGRADE CHOLANGIOPANCREATOGRAPHY (ERCP) WITH PROPOFOL N/A 05/08/2016   Procedure: ENDOSCOPIC RETROGRADE CHOLANGIOPANCREATOGRAPHY (ERCP) WITH PROPOFOL;  Surgeon: Lucilla Lame, MD;  Location: [facility] ENDOSCOPY;  Service: Endoscopy;  Laterality: N/A;   ERCP     with biliary spincterotomy 05/08/16 Dr. Allen Norris for choledocholithiasis    ESOPHAGEAL DILATION  04/02/2016   Procedure: ESOPHAGEAL DILATION;  Surgeon: Jonathon Bellows, MD;  Location: [facility] ENDOSCOPY;  Service: Endoscopy;;   ESOPHAGOGASTRODUODENOSCOPY (EGD) WITH PROPOFOL N/A 04/02/2016   Procedure: ESOPHAGOGASTRODUODENOSCOPY (EGD) WITH PROPOFOL;  Surgeon: Jonathon Bellows, MD;  Location: [facility] END[facility];  Service: Endoscopy;  Laterality: N/A;   HIP ARTHROPLASTY Right 11/01/2016   Procedure: ARTHROPLASTY BIPOLAR HIP (HEMIARTHROPLASTY);  Surgeon: Corky Mull, MD;  Location: [facility] ORS;  Service: Orthopedics;  Laterality: Right;   LUMBAR LAMINECTOMY/DECOMPRESSION MICRODISCECTOMY N/A 12/11/2020   Procedure: LEFT L2-3 MICRODISCECTOMY, L3-4 AND L5-S1 DECOMPRESSION;  Surgeon: Meade Maw, MD;  Location: [facility] OR[facility];  Service: Neurosurgery;  Laterality: N/A;   NEPHRECTOMY  1988   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   ORIF SCAPHOID FRACTURE Left 12/21/2019   Procedure: OPEN REDUCTION INTERNAL FIXATION (ORIF) OF LEFT SCAPULAR NONUNION WITH BONE GRAFT;  Surgeon: Corky Mull, MD;  Location: [facility] OR[facility];  Service: Orthopedics;  Laterality: Left;   osteoporosis     noted DEXA 08/19/16    REPLACEMENT TOTAL KNEE Right    REVERSE SHOULDER ARTHROPLASTY Right 11/04/2017   Procedure: REVERSE SHOULDER ARTHROPLASTY;  Surgeon: Corky Mull, MD;  Location: [facility] OR[facility];  Service: Orthopedics;   Laterality: Right;   REVERSE SHOULDER ARTHROPLASTY Left 07/26/2018   Procedure: REVERSE SHOULDER ARTHROPLASTY;  Surgeon: Corky Mull, MD;  Location: [facility];  Service: Orthopedics;  Laterality: Left;   TONSILLECTOMY     TOTAL HIP ARTHROPLASTY  12/10/11   [facility] left hip   TOTAL HIP ARTHROPLASTY Bilateral    TUBAL LIGATION       reports that she quit smoking about 46 years ago. Her smoking use included cigarettes. She has a 10.00 pack-year smoking history. She has never used smokeless tobacco. She reports that she does not drink alcohol and does not use drugs.  Allergies  Allergen Reactions   Ceftin [Cefuroxime Axetil] Anaphylaxis   Lisinopril Anaphylaxis   Sulfa Antibiotics Other (See Comments)    Face swelling   Sulfasalazine Anaphylaxis   Morphine Other (See Comments)  Per patient, low blood pressure issues that requires action to raise it back up. Can take small infrequent doses   Xarelto [Rivaroxaban] Other (See Comments)    Stomach burning, bleeding, and tar in stool   Adhesive [Tape] Rash    Paper tape and tega derm OK   Antihistamines, Chlorpheniramine-Type Other (See Comments)    Makes pt hyper   Antivert [Meclizine Hcl] Other (See Comments)    Bladder will not empty   Aspirin Other (See Comments)    Sulfasalazine allergy cross reacts   Contrast Media [Iodinated Diagnostic Agents] Rash    she is able to use betadine scrubs.   Decongestant [Pseudoephedrine Hcl] Other (See Comments)    Makes pt hyper   Doxycycline Other (See Comments)    GI upset   Levaquin [Levofloxacin In D5w] Rash   Polymyxin B Other (See Comments)    Facial rash   Tetanus Toxoids Rash and Other (See Comments)    Fever and hot to touch at injection site    Family History  Problem Relation Age of Onset   Rheum arthritis Mother    Asthma Mother    Parkinson's disease Mother    Heart disease Mother    Stroke Mother    Hypertension Mother    Heart attack Father    Heart disease Father     Hypertension Father    Peripheral Artery Disease Father    Diabetes Son    Gout Son    Asthma Sister    Heart disease Sister    Lung cancer Sister    Heart disease Sister    Heart disease Sister    Breast cancer Sister    Heart attack Sister    Heart disease Brother    Heart disease Maternal Grandmother    Diabetes Maternal Grandmother    Colon cancer Maternal Grandmother    Cancer Maternal Grandmother        Hodgkins lymphoma   Heart disease Brother    Alcohol abuse Brother    Depression Brother    Dementia Son     Prior to Admission medications   Medication Sig Start Date End Date Taking? Authorizing Provider  Adalimumab 40 MG/0.4ML PNKT Inject 40 mg into the skin every 14 (fourteen) days.    [provider]  albuterol (VENTOLIN HFA) 108 (90 Base) MCG/ACT inhaler Inhale 2 puffs into the lungs every 6 (six) hours as needed for wheezing or shortness of breath. 09/03/20   McLean-Scocuzza, Nino Glow, MD  ALPRAZolam (XANAX) 0.25 MG tablet TAKE 1 TABLET BY MOUTH AT BEDTIME AS NEEDED FOR ANXIETY. 12/13/20   Loleta Dicker, PA  amLODipine (NORVASC) 2.5 MG tablet Take 1 tablet (2.5 mg total) by mouth daily. 09/03/20   McLean-Scocuzza, Nino Glow, MD  benzonatate (TESSALON) 200 MG capsule Take 1 capsule (200 mg total) by mouth 3 (three) times daily as needed for cough. 09/03/20   McLean-Scocuzza, Nino Glow, MD  budesonide (PULMICORT) 0.25 MG/2ML nebulizer solution USE 1 VIAL  IN  NEBULIZER TWICE  DAILY - rinse mouth after treatment 12/01/19   [name], MD  cyclobenzaprine (FLEXERIL) 10 MG tablet Take 1 tablet (10 mg total) by mouth 3 (three) times daily as needed for muscle spasms. 11/04/20   Kathie Dike, MD  dicyclomine (BENTYL) 10 MG capsule Take 1 capsule (10 mg total) by mouth 2 (two) times daily as needed for spasms. 10/01/20 09/26/21  Jonathon Bellows, MD  escitalopram (LEXAPRO) 10 MG tablet TAKE 1 TABLET BY MOUTH  EVERY DAY 09/13/20   McLean-Scocuzza, Nino Glow, MD  famotidine  (PEPCID) 20 MG tablet TAKE 1 TABLET (20 MG TOTAL) BY MOUTH DAILY. BEFORE BREAKFAST OR DINNER 05/23/20   McLean-Scocuzza, Nino Glow, MD  fluticasone (FLONASE) 50 MCG/ACT nasal spray Place 2 sprays into both nostrils daily. In am 12/08/19   McLean-Scocuzza, Nino Glow, MD  gabapentin (NEURONTIN) 300 MG capsule Take 2 capsules (600 mg total) by mouth in the morning and at bedtime. 09/03/20   McLean-Scocuzza, Nino Glow, MD  guaiFENesin (MUCINEX) 600 MG 12 hr tablet Take 1 tablet (600 mg total) by mouth 2 (two) times daily. 11/04/20   Kathie Dike, MD  ipratropium-albuterol (DUONEB) 0.5-2.5 (3) MG/3ML SOLN TAKE 3 MLS BY NEBULIZATION 3 (THREE) TIMES DAILY AS NEEDED. Patient taking differently: Take 3 mLs by nebulization 2 (two) times daily. 07/29/20   [name], MD  lamoTRIgine (LAMICTAL) 100 MG tablet TAKE 1 TAB 2 TIMES DAILY. FURTHER REFILLS NEW PSYCH FOR ALL PSYCH MEDS ONLY TEMP SUPPLY FROM PCP 09/03/20   McLean-Scocuzza, Nino Glow, MD  leflunomide (ARAVA) 20 MG tablet Take 1 tablet (20 mg total) by mouth daily. 09/30/17   McLean-Scocuzza, Nino Glow, MD  lidocaine (LIDODERM) 5 % Place 1 patch onto the skin 2 (two) times daily as needed. Remove & Discard patch within 12 hours or as directed by MD 11/22/20   McLean-Scocuzza, Nino Glow, MD  lovastatin (MEVACOR) 20 MG tablet TAKE 1 TABLET BY MOUTH EVERYDAY AT BEDTIME *STOP TALKING 40MG * 07/12/20   McLean-Scocuzza, Nino Glow, MD  mirabegron ER (MYRBETRIQ) 50 MG TB24 tablet Take 1 tablet (50 mg total) by mouth daily. 12/04/20 11/29/21  Vaillancourt, Aldona Bar, PA-C  montelukast (SINGULAIR) 10 MG tablet TAKE 1 TABLET BY MOUTH EVERY DAY 08/09/20   McLean-Scocuzza, Nino Glow, MD  nebivolol (BYSTOLIC) 10 MG tablet Take 0.5 tablets (5 mg total) by mouth in the morning and at bedtime. 12/08/19   McLean-Scocuzza, Nino Glow, MD  pantoprazole (PROTONIX) 40 MG tablet TAKE 1 TABLET BY MOUTH 2 TIMES DAILY 30 MIN BEFORE FOOD (NOTE REDUCTION IN FREQUENCY) 07/02/20   McLean-Scocuzza, Nino Glow, MD   predniSONE (DELTASONE) 5 MG tablet Take 40mg  po daily for 2 days then 30mg  daily for 2 days then 20mg  daily for 2 days then 10mg  daily for 2 days then 7.5mg  daily 11/04/20   Kathie Dike, MD  QUEtiapine (SEROQUEL) 25 MG tablet TAKE 1 TABLET (25 MG TOTAL) BY MOUTH AT BEDTIME. AGAIN LAST FILL FURTHER REFILLS FROM PSYCHIATRY NO EXCEPTIONS 09/13/20   McLean-Scocuzza, Nino Glow, MD  senna-docusate (SENOKOT-S) 8.6-50 MG tablet Take 1 tablet by mouth at bedtime as needed for mild constipation. 12/13/20   Loleta Dicker, PA  sucralfate (CARAFATE) 1 g tablet Take 1 tablet (1 g total) by mouth 2 (two) times daily. 09/17/20 09/12/21  Jonathon Bellows, MD  zoledronic acid (RECLAST) 5 MG/100ML SOLN injection Inject 5 mg into the vein once.    [provider]    Physical Exam: Vitals:   01/12/21 1318 01/12/21 1320 01/12/21 1853 01/12/21 2030  BP: 132/70  (!) 145/77 (!) 146/65  Pulse: (!) 119  (!) 118 (!) 102  Resp: 17  (!) 24 12  Temp: 98.4 F (36.9 C)     TempSrc: Oral     SpO2: (!) 88%  97% 95%  Weight:  81.6 kg    Height:  5\' 1"  (1.549 m)     Physical Exam Vitals and nursing note reviewed.  Constitutional:      General:  She is not in acute distress.    Appearance: Normal appearance. She is not ill-appearing.  HENT:     Head: Normocephalic and atraumatic.     Right Ear: External ear normal.     Left Ear: External ear normal.     Nose: Nose normal.     Mouth/Throat:     Mouth: Mucous membranes are moist.  Eyes:     Extraocular Movements: Extraocular movements intact.     Pupils: Pupils are equal, round, and reactive to light.  Cardiovascular:     Rate and Rhythm: Normal rate and regular rhythm.     Pulses: Normal pulses.     Heart sounds: Normal heart sounds.  Pulmonary:     Effort: Pulmonary effort is normal.     Breath sounds: Wheezing present.  Abdominal:     General: Bowel sounds are normal. There is no distension.     Palpations: Abdomen is soft. There is no mass.      Tenderness: There is no abdominal tenderness. There is no guarding.     Hernia: No hernia is present.  Musculoskeletal:     Right lower leg: No edema.     Left lower leg: No edema.  Neurological:     General: No focal deficit present.     Mental Status: She is alert and oriented to person, place, and time.  Psychiatric:        Mood and Affect: Mood normal.        Behavior: Behavior normal.     Labs on Admission: I have personally reviewed following labs and imaging studies  No results for input(s): CKTOTAL, CKMB, TROPONINI in the last 72 hours. Lab Results  Component Value Date   WBC 15.9 (H) 01/12/2021   HGB 13.3 01/12/2021   HCT 42.1 01/12/2021   MCV 89.2 01/12/2021   PLT 273 01/12/2021    Recent Labs  Lab 01/12/21 1327  NA 131*  K 3.7  CL 96*  CO2 26  BUN 18  CREATININE 1.36*  CALCIUM 8.9  GLUCOSE 178*   Lab Results  Component Value Date   CHOL 176 12/01/2019   HDL 53.60 12/01/2019   LDLCALC 60 12/01/2018   TRIG 360.0 (H) 12/01/2019   No results found for: DDIMER Invalid input(s): POCBNP   COVID-19 Labs No results for input(s): DDIMER, FERRITIN, LDH, CRP in the last 72 hours. Lab Results  Component Value Date   SARSCOV2NAA NEGATIVE 01/12/2021   [place] NEGATIVE 12/10/2020   [place] NEGATIVE 11/03/2020   [place] NEGATIVE 10/29/2020    Radiological Exams on Admission: DG Chest 2 View  Result Date: 01/12/2021 CLINICAL DATA:  Productive cough, hemoptysis. EXAM: CHEST - 2 VIEW COMPARISON:  Chest radiograph dated December 04, 2020 FINDINGS: The heart size and mediastinal contours are within normal limits. Hyperinflated lungs with increase in AP diameter of the chest. No focal consolidation or pleural effusion. Chronic healed right-sided rib fractures. Bilateral shoulder arthroplasty. IMPRESSION: COPD without evidence of acute cardiopulmonary process. Electronically Signed   By: Keane Police D.O.   On: 01/12/2021 13:57    EKG: Independently  reviewed.  Sinus tach 117 with nonspecific ST-T wave changes.   Assessment/Plan: Principal Problem:   SOB (shortness of breath) Active Problems:   Respiratory failure with hypoxia (HCC)   COPD exacerbation (HCC)   Essential hypertension Shortness of breath/respiratory failure with hypoxia/COPD exacerbation: -Patient's shortness of breath and productive cough are most likely caused by acute COPD exacerbation.  -She does not have fever  and has mild leukocytosis.  -Chest x-ray is not consistent with pneumonia -She was given a continuous neb treatment in the ED with some improvement.   -will observe for now and attempt to wean off [place] O2 if possible -will admit patient  it seems likely that she will need several days of hospitalization to show sufficient improvement for discharge. -Nebulizers: scheduled Duoneb and prn albuterol -Solu-Medrol 80 mg IV BID -> Prednisone 40 mg PO daily; 5 days of treatment is thought to be sufficient for treatment of acute COPD exacerbations -IV Azithromycin  -Coordinated care with [facility] team/PT/OT/Nutrition/RT consults  Hypertension: Blood pressure (!) 146/65, pulse (!) 102, temperature 98.4 F (36.9 C), temperature source Oral, resp. rate 12, height 5\' 1"  (1.549 m), weight 81.6 kg, SpO2 95 %. Continue Nebivolol.   DVT prophylaxis:  Heparin  Code Status:  Full code  Family Communication:  Ohlson,Dean (Son)  [phone] (Mobile)   Disposition Plan:  Home  Consults called:  None  Admission status: Observation   Para Skeans MD [facility] [phone] How to contact the [facility] Attending or Consulting provider [place] or covering provider during after hours [place], for this patient.    Check the care team in [facility] and look for a) attending/consulting [facility] provider listed and b) the [facility] team listed Log into [email or web address] and use [place]'s universal password to access. If you do not have the password, please contact the hospital  operator. Locate the [facility] provider you are looking for under [facility] and page to a number that you can be directly reached. If you still have difficulty reaching the provider, please page the [facility] (Director on Call) for the Hospitalists listed on amion for assistance. [email or web address] Password [email or web address] 01/12/2021, 10:30 PM

## 2021-01-13 DIAGNOSIS — R0602 Shortness of breath: Secondary | ICD-10-CM | POA: Diagnosis not present

## 2021-01-13 DIAGNOSIS — J9601 Acute respiratory failure with hypoxia: Secondary | ICD-10-CM | POA: Diagnosis not present

## 2021-01-13 DIAGNOSIS — M79605 Pain in left leg: Secondary | ICD-10-CM

## 2021-01-13 DIAGNOSIS — J441 Chronic obstructive pulmonary disease with (acute) exacerbation: Secondary | ICD-10-CM | POA: Diagnosis not present

## 2021-01-13 DIAGNOSIS — M79604 Pain in right leg: Secondary | ICD-10-CM | POA: Insufficient documentation

## 2021-01-13 DIAGNOSIS — I10 Essential (primary) hypertension: Secondary | ICD-10-CM

## 2021-01-13 LAB — CBC
HCT: 36.9 % (ref 36.0–46.0)
Hemoglobin: 11.5 g/dL — ABNORMAL LOW (ref 12.0–15.0)
MCH: 27.6 pg (ref 26.0–34.0)
MCHC: 31.2 g/dL (ref 30.0–36.0)
MCV: 88.5 fL (ref 80.0–100.0)
Platelets: 242 10*3/uL (ref 150–400)
RBC: 4.17 MIL/uL (ref 3.87–5.11)
RDW: 15 % (ref 11.5–15.5)
WBC: 11.2 10*3/uL — ABNORMAL HIGH (ref 4.0–10.5)
nRBC: 0 % (ref 0.0–0.2)

## 2021-01-13 LAB — BASIC METABOLIC PANEL
Anion gap: 8 (ref 5–15)
BUN: 18 mg/dL (ref 8–23)
CO2: 27 mmol/L (ref 22–32)
Calcium: 8.5 mg/dL — ABNORMAL LOW (ref 8.9–10.3)
Chloride: 98 mmol/L (ref 98–111)
Creatinine, Ser: 1.27 mg/dL — ABNORMAL HIGH (ref 0.44–1.00)
GFR, Estimated: 43 mL/min — ABNORMAL LOW (ref >60)
Glucose, Bld: 257 mg/dL — ABNORMAL HIGH (ref 70–99)
Potassium: 4.1 mmol/L (ref 3.5–5.1)
Sodium: 133 mmol/L — ABNORMAL LOW (ref 135–145)

## 2021-01-13 LAB — EXPECTORATED SPUTUM ASSESSMENT W GRAM STAIN, RFLX TO RESP C

## 2021-01-13 MED ORDER — HYDROCOD POLST-CPM POLST ER 10-8 MG/5ML PO SUER: 5.0000 mL | Freq: Two times a day (BID) | ORAL | Status: DC | PRN

## 2021-01-13 MED ORDER — TRAZODONE HCL 50 MG PO TABS
50.0000 mg | ORAL_TABLET | Freq: Every evening | ORAL | Status: DC | PRN
  Administered 2021-01-13: 50 mg via ORAL
  Filled 2021-01-13: qty 1

## 2021-01-13 MED ORDER — PREDNISONE 20 MG PO TABS
40.0000 mg | ORAL_TABLET | Freq: Every day | ORAL | 0 refills | Status: AC
Start: 2021-01-14 — End: 2021-01-19

## 2021-01-13 MED ORDER — ENSURE ENLIVE PO LIQD: 237.0000 mL | Freq: Two times a day (BID) | ORAL | Status: DC

## 2021-01-13 MED ORDER — IPRATROPIUM-ALBUTEROL 0.5-2.5 (3) MG/3ML IN SOLN
3.0000 mL | Freq: Four times a day (QID) | RESPIRATORY_TRACT | Status: DC
  Administered 2021-01-13: 3 mL via RESPIRATORY_TRACT
  Filled 2021-01-13 (×2): qty 3

## 2021-01-13 MED ORDER — GABAPENTIN 300 MG PO CAPS
600.0000 mg | ORAL_CAPSULE | Freq: Two times a day (BID) | ORAL | Status: DC
  Administered 2021-01-13: 600 mg via ORAL
  Filled 2021-01-13: qty 2

## 2021-01-13 MED ORDER — ADULT MULTIVITAMIN W/MINERALS CH
1.0000 | ORAL_TABLET | Freq: Every day | ORAL | Status: DC
  Administered 2021-01-13: 1 via ORAL
  Filled 2021-01-13: qty 1

## 2021-01-13 MED ORDER — AZITHROMYCIN 500 MG PO TABS: 500.0000 mg | ORAL_TABLET | Freq: Every day | ORAL | 0 refills | Status: AC

## 2021-01-13 NOTE — ED Notes (Signed)
OT at bedside. 

## 2021-01-13 NOTE — Discharge Summary (Signed)
Physician Discharge Summary  Kerri Carter URK:[id number] DOB: 1940/03/02 DOA: 01/12/2021  PCP: McLean-Scocuzza, Nino Glow, MD  Admit date: 01/12/2021 Discharge date: 01/13/2021  Admitted From: Home Disposition: Home  Recommendations for Outpatient Follow-up:  Follow up with PCP within 1-2 weeks to monitor respiratory status. Recommend evaluation for charcot foot. Avoid opioids for respiratory depression contributions.   No home health recommendations by PT or OT  Discharge Condition:stable, improved CODE STATUS:  Code Status: Full Code  Regular healthy diet  Brief/Interim Summary: Pt presented with acute shortness of breath in COPD exacerbation. She rapidly improved with breathing treatments, steroids, antibiotics and was removed to room air. Able to maintain O2 saturations >90% while ambulating with PT/OT.  Had minimal mobility due to chronic pain/neuropathy/deformities. She endorsed being at her baseline.   Discharge Diagnoses:  Principal Problem:   SOB (shortness of breath) Active Problems:   Essential hypertension   COPD exacerbation (HCC)   Respiratory failure with hypoxia (HCC)   Acute respiratory failure with hypoxia (HCC)  Allergies as of 01/13/2021       Reactions   Ceftin [cefuroxime Axetil] Anaphylaxis   Lisinopril Anaphylaxis   Sulfa Antibiotics Other (See Comments)   Face swelling   Sulfasalazine Anaphylaxis   Morphine Other (See Comments)   Per patient, low blood pressure issues that requires action to raise it back up. Can take small infrequent doses   Xarelto [rivaroxaban] Other (See Comments)   Stomach burning, bleeding, and tar in stool   Adhesive [tape] Rash   Paper tape and tega derm OK   Antihistamines, Chlorpheniramine-type Other (See Comments)   Makes pt hyper   Antivert [meclizine Hcl] Other (See Comments)   Bladder will not empty   Aspirin Other (See Comments)   Sulfasalazine allergy cross reacts   Contrast Media [iodinated Diagnostic  Agents] Rash   she is able to use betadine scrubs.   Decongestant [pseudoephedrine Hcl] Other (See Comments)   Makes pt hyper   Doxycycline Other (See Comments)   GI upset   Levaquin [levofloxacin In D5w] Rash   Polymyxin B Other (See Comments)   Facial rash   Tetanus Toxoids Rash, Other (See Comments)   Fever and hot to touch at injection site        Medication List     STOP taking these medications    ALPRAZolam 0.25 MG tablet Commonly known as: XANAX   guaiFENesin 600 MG 12 hr tablet Commonly known as: MUCINEX   pantoprazole 40 MG tablet Commonly known as: PROTONIX       TAKE these medications    Adalimumab 40 MG/0.4ML Pnkt Inject 40 mg into the skin every 14 (fourteen) days.   albuterol 108 (90 Base) MCG/ACT inhaler Commonly known as: Ventolin HFA Inhale 2 puffs into the lungs every 6 (six) hours as needed for wheezing or shortness of breath.   amLODipine 2.5 MG tablet Commonly known as: NORVASC Take 1 tablet (2.5 mg total) by mouth daily.   azithromycin 500 MG tablet Commonly known as: ZITHROMAX Take 1 tablet (500 mg total) by mouth daily for 5 days. Start taking on: January 14, 2021   benzonatate 200 MG capsule Commonly known as: TESSALON Take 1 capsule (200 mg total) by mouth 3 (three) times daily as needed for cough.   budesonide 0.25 MG/2ML nebulizer solution Commonly known as: PULMICORT USE 1 VIAL  IN  NEBULIZER TWICE  DAILY - rinse mouth after treatment   cyclobenzaprine 10 MG tablet Commonly known as: FLEXERIL Take 1  tablet (10 mg total) by mouth 3 (three) times daily as needed for muscle spasms.   dicyclomine 10 MG capsule Commonly known as: BENTYL Take 1 capsule (10 mg total) by mouth 2 (two) times daily as needed for spasms. What changed:  how much to take when to take this   escitalopram 10 MG tablet Commonly known as: LEXAPRO TAKE 1 TABLET BY MOUTH EVERY DAY   etodolac 500 MG tablet Commonly known as: LODINE Take 500 mg by  mouth daily.   famotidine 20 MG tablet Commonly known as: PEPCID TAKE 1 TABLET (20 MG TOTAL) BY MOUTH DAILY. BEFORE BREAKFAST OR DINNER   fluticasone 50 MCG/ACT nasal spray Commonly known as: Flonase Place 2 sprays into both nostrils daily. In am   gabapentin 300 MG capsule Commonly known as: NEURONTIN Take 2 capsules (600 mg total) by mouth in the morning and at bedtime.   ipratropium-albuterol 0.5-2.5 (3) MG/3ML Soln Commonly known as: DUONEB TAKE 3 MLS BY NEBULIZATION 3 (THREE) TIMES DAILY AS NEEDED. What changed: when to take this   lamoTRIgine 100 MG tablet Commonly known as: LAMICTAL TAKE 1 TAB 2 TIMES DAILY. FURTHER REFILLS NEW PSYCH FOR ALL PSYCH MEDS ONLY TEMP SUPPLY FROM PCP What changed:  how much to take how to take this when to take this   leflunomide 20 MG tablet Commonly known as: ARAVA Take 1 tablet (20 mg total) by mouth daily.   lidocaine 5 % Commonly known as: Lidoderm Place 1 patch onto the skin 2 (two) times daily as needed. Remove & Discard patch within 12 hours or as directed by MD What changed: when to take this   lovastatin 20 MG tablet Commonly known as: MEVACOR TAKE 1 TABLET BY MOUTH EVERYDAY AT BEDTIME *STOP TALKING 40MG * What changed: See the new instructions.   mirabegron ER 50 MG Tb24 tablet Commonly known as: Myrbetriq Take 1 tablet (50 mg total) by mouth daily.   montelukast 10 MG tablet Commonly known as: SINGULAIR TAKE 1 TABLET BY MOUTH EVERY DAY   multivitamin-lutein Caps capsule Take 1 capsule by mouth daily.   nebivolol 10 MG tablet Commonly known as: Bystolic Take 0.5 tablets (5 mg total) by mouth in the morning and at bedtime.   predniSONE 20 MG tablet Commonly known as: DELTASONE Take 2 tablets (40 mg total) by mouth daily with breakfast for 5 days. Start taking on: January 14, 2021 What changed:  medication strength how much to take how to take this when to take this additional instructions   QUEtiapine 25  MG tablet Commonly known as: SEROQUEL TAKE 1 TABLET (25 MG TOTAL) BY MOUTH AT BEDTIME. AGAIN LAST FILL FURTHER REFILLS FROM PSYCHIATRY NO EXCEPTIONS   sucralfate 1 g tablet Commonly known as: CARAFATE Take 1 tablet (1 g total) by mouth 2 (two) times daily.   Vitamin D3 50 MCG (2000 UT) Tabs Take 4,000 Units by mouth daily.   zoledronic acid 5 MG/100ML Soln injection Commonly known as: RECLAST Inject 5 mg into the vein once.        Allergies  Allergen Reactions   Ceftin [Cefuroxime Axetil] Anaphylaxis   Lisinopril Anaphylaxis   Sulfa Antibiotics Other (See Comments)    Face swelling   Sulfasalazine Anaphylaxis   Morphine Other (See Comments)    Per patient, low blood pressure issues that requires action to raise it back up. Can take small infrequent doses   Xarelto [Rivaroxaban] Other (See Comments)    Stomach burning, bleeding, and tar in stool  Adhesive [Tape] Rash    Paper tape and tega derm OK   Antihistamines, Chlorpheniramine-Type Other (See Comments)    Makes pt hyper   Antivert [Meclizine Hcl] Other (See Comments)    Bladder will not empty   Aspirin Other (See Comments)    Sulfasalazine allergy cross reacts   Contrast Media [Iodinated Diagnostic Agents] Rash    she is able to use betadine scrubs.   Decongestant [Pseudoephedrine Hcl] Other (See Comments)    Makes pt hyper   Doxycycline Other (See Comments)    GI upset   Levaquin [Levofloxacin In D5w] Rash   Polymyxin B Other (See Comments)    Facial rash   Tetanus Toxoids Rash and Other (See Comments)    Fever and hot to touch at injection site    Consultations: none  Procedures/Studies: DG Chest 2 View  Result Date: 01/12/2021 CLINICAL DATA:  Productive cough, hemoptysis. EXAM: CHEST - 2 VIEW COMPARISON:  Chest radiograph dated December 04, 2020 FINDINGS: The heart size and mediastinal contours are within normal limits. Hyperinflated lungs with increase in AP diameter of the chest. No focal  consolidation or pleural effusion. Chronic healed right-sided rib fractures. Bilateral shoulder arthroplasty. IMPRESSION: COPD without evidence of acute cardiopulmonary process. Electronically Signed   By: Keane Police D.O.   On: 01/12/2021 13:57   US Venous Img Lower Bilateral (DVT)  Result Date: 01/12/2021 CLINICAL DATA:  Bilateral lower extremity pain. EXAM: Bilateral LOWER EXTREMITY VENOUS DOPPLER ULTRASOUND TECHNIQUE: Gray-scale sonography with compression, as well as color and duplex ultrasound, were performed to evaluate the deep venous system(s) from the level of the common femoral vein through the popliteal and proximal calf veins. COMPARISON:  Ultrasound dated 12/18/2016. FINDINGS: VENOUS Normal compressibility of the common femoral, superficial femoral, and popliteal veins, as well as the visualized calf veins. Visualized portions of profunda femoral vein and great saphenous vein unremarkable. No filling defects to suggest DVT on grayscale or color Doppler imaging. Doppler waveforms show normal direction of venous flow, normal respiratory plasticity and response to augmentation. Limited views of the contralateral common femoral vein are unremarkable. OTHER None. Limitations: none IMPRESSION: Negative. Electronically Signed   By: Anner Crete M.D.   On: 01/12/2021 23:37    Subjective: Patient feels back to baseline. No SOB at rest. Mostly concerned about her chronic bilateral leg pain which is unchanged from baseline. Improved with home gabapentin.   Discharge Exam: Vitals:   01/13/21 1100 01/13/21 1200  BP: 134/77 (!) 117/59  Pulse: 93 84  Resp: 15 14  Temp:    SpO2: (!) 88% 91%    General: Pt is alert, awake, not in acute distress Cardiovascular: RRR, S1/S2 +, no rubs, no gallops Respiratory: normal respiratory effort, mild inspiratory wheezes.  Abdominal: Soft, NT, ND, bowel sounds + Extremities: no edema, no cyanosis  Labs: Basic Metabolic Panel: Recent Labs  Lab  01/12/21 1327 01/12/21 2250 01/13/21 0628  NA 131*  --  133*  K 3.7  --  4.1  CL 96*  --  98  CO2 26  --  27  GLUCOSE 178*  --  257*  BUN 18  --  18  CREATININE 1.36* 1.29* 1.27*  CALCIUM 8.9  --  8.5*   CBC: Recent Labs  Lab 01/12/21 1327 01/12/21 2250 01/13/21 0628  WBC 15.9* 14.1* 11.2*  HGB 13.3 12.3 11.5*  HCT 42.1 38.6 36.9  MCV 89.2 88.7 88.5  PLT 273 267 242    Microbiology Recent Results (from the  past 240 hour(s))  Resp Panel by RT-PCR (Flu A&B, Covid) Nasopharyngeal Swab     Status: None   Collection Time: 01/12/21  7:40 PM   Specimen: Nasopharyngeal Swab; Nasopharyngeal(NP) swabs in vial transport medium  Result Value Ref Range Status   SARS Coronavirus 2 by RT PCR NEGATIVE NEGATIVE Final    Comment: (NOTE) SARS-CoV-2 target nucleic acids are NOT DETECTED.  The SARS-CoV-2 RNA is generally detectable in upper respiratory specimens during the acute phase of infection. The lowest concentration of SARS-CoV-2 viral copies this assay can detect is 138 copies/mL. A negative result does not preclude SARS-Cov-2 infection and should not be used as the sole basis for treatment or other patient management decisions. A negative result may occur with  improper specimen collection/handling, submission of specimen other than nasopharyngeal swab, presence of viral mutation(s) within the areas targeted by this assay, and inadequate number of viral copies(<138 copies/mL). A negative result must be combined with clinical observations, patient history, and epidemiological information. The expected result is Negative.  Fact Sheet for Patients:  EntrepreneurPulse.com.au  Fact Sheet for Healthcare Providers:  IncredibleEmployment.be  This test is no t yet approved or cleared by the [place] FDA and  has been authorized for detection and/or diagnosis of SARS-CoV-2 by FDA under an Emergency Use Authorization (EUA). This EUA will  remain  in effect (meaning this test can be used) for the duration of the COVID-19 declaration under Section 564(b)(1) of the Act, 21 U.S.C.section 360bbb-3(b)(1), unless the authorization is terminated  or revoked sooner.       Influenza A by PCR NEGATIVE NEGATIVE Final   Influenza B by PCR NEGATIVE NEGATIVE Final    Comment: (NOTE) The Xpert Xpress SARS-CoV-2/FLU/RSV plus assay is intended as an aid in the diagnosis of influenza from Nasopharyngeal swab specimens and should not be used as a sole basis for treatment. Nasal washings and aspirates are unacceptable for Xpert Xpress SARS-CoV-2/FLU/RSV testing.  Fact Sheet for Patients: EntrepreneurPulse.com.au  Fact Sheet for Healthcare Providers: IncredibleEmployment.be  This test is not yet approved or cleared by the [place] FDA and has been authorized for detection and/or diagnosis of SARS-CoV-2 by FDA under an Emergency Use Authorization (EUA). This EUA will remain in effect (meaning this test can be used) for the duration of the COVID-19 declaration under Section 564(b)(1) of the Act, 21 U.S.C. section 360bbb-3(b)(1), unless the authorization is terminated or revoked.  Performed at [facility], [address] [place], [place] [address]   Expectorated Sputum Assessment w Gram Stain, Rflx to Resp Cult     Status: None   Collection Time: 01/12/21 10:50 PM   Specimen: Expectorated Sputum  Result Value Ref Range Status   Specimen Description EXPECTORATED SPUTUM  Final   Special Requests NONE  Final   Sputum evaluation   Final    THIS SPECIMEN IS ACCEPTABLE FOR SPUTUM CULTURE Performed at [facility], [address] [place], [place] [address]    Report Status 01/13/2021 FINAL  Final    Time coordinating discharge: Over 64 minutes  Richarda Osmond, MD  [facility] 01/13/2021, 12:42 PM

## 2021-01-13 NOTE — TOC Initial Note (Signed)
Transition of Care [facility]) - Initial/Assessment Note    Patient Details  Name: Kerri Carter MRN: [id number] Date of Birth: Jul 28, 1940  [facility] CM/SW Contact:    Shelbie Hutching, RN Phone Number: 01/13/2021, 4:16 PM  Clinical Narrative:                 Patient placed under observation for leg pain and COPD exacerbation.  Patient being discharged home today.  Family, Grandson coming to pick patient up this afternoon to take home.  Patient lives with her grandson and his wife.  She was recently discharge from [facility] for rehab and she has home health services with [facility].  [place] with [facility] aware of DC today, open for RN, PT, OT, and aide.  They may add SW back as patient's son verbalized interest in [place] and PCS in the home.   Patient has a walker, bedside commode, lift chair and hospital bed at home.   [facility] scheduled to see patient at home tomorrow.    Expected Discharge Plan: [place] Barriers to Discharge: Barriers Resolved   Patient Goals and CMS Choice   CMS Medicare.gov Compare Post Acute Care list provided to:: Patient Choice offered to / list presented to : Patient  Expected Discharge Plan and Services Expected Discharge Plan: [place]   Discharge Planning Services: CM Consult Post Acute Care Choice: [place] arrangements for the past 2 months: Single Family Home Expected Discharge Date: 01/13/21               DME Arranged: N/A DME Agency: NA       HH Arranged: RN, PT, OT, Nurse's Aide [place] Agency: [place] Date [place]: 01/13/21 Time [place]: [id number] Representative spoke with at [place]: [place]  Prior Living Arrangements/Services Living arrangements for the past 2 months: [place] Lives with:: Relatives (grandson and his wife) Patient language and need for interpreter reviewed:: Yes Do you feel safe going back to the place  where you live?: Yes      Need for Family Participation in Patient Care: Yes (Comment) (pain and weakness) Care giver support system in place?: Yes (comment) (son and grandson) Current home services: DME, Home PT, Home OT, Home RN, Homehealth aide (walker, bedside commode, hospital bed) Criminal Activity/Legal Involvement Pertinent to Current Situation/Hospitalization: No - Comment as needed  Activities of Daily Living      Permission Sought/Granted Permission sought to share information with : Case Manager, Family Supports, Other (comment) Permission granted to share information with : Yes, Verbal Permission Granted  Share Information with NAME: Buena Boehm  Permission granted to share info w AGENCY: [facility]  Permission granted to share info w Relationship: son  Permission granted to share info w Contact Information: [phone]  Emotional Assessment Appearance:: Appears stated age Attitude/Demeanor/Rapport: Engaged Affect (typically observed): Accepting Orientation: : Oriented to Self, Oriented to Place, Oriented to  Time, Oriented to Situation Alcohol / Substance Use: Not Applicable Psych Involvement: No (comment)  Admission diagnosis:  Acute respiratory failure with hypoxia ([place]) [J96.01] Patient Active Problem List   Diagnosis Date Noted   Leg pain, bilateral    Respiratory failure with hypoxia ([place]) 01/12/2021   Acute respiratory failure with hypoxia ([place]) 01/12/2021   Frequent falls 12/18/2020   Hip pain 12/18/2020   Acute pain of left knee 12/18/2020   Elevated brain natriuretic peptide (BNP) level 12/18/2020  Weakness of both lower extremities 12/18/2020   Assistance needed for mobility 12/18/2020   Impaired mobility 12/18/2020   Urinary incontinence 12/18/2020   Pleural effusion on left 12/18/2020   Lumbar radiculopathy 12/11/2020   COPD exacerbation ([place]) 10/29/2020   Acromial process of scapula fracture 12/21/2019   NSIP (nonspecific interstitial  pneumonitis) ([place]) 12/08/2019   Iliopsoas bursitis of right hip 08/29/2019   Chronic left shoulder pain 08/29/2019   Overweight (BMI 25.0-29.9) 08/29/2019   Hypertriglyceridemia 07/24/2019   Bipolar disorder, in full remission, most recent episode depressed ([place]) 07/13/2019   Essential hypertension 06/16/2019   Chronic pain 06/16/2019   Lymphedema 06/05/2019   Chronic venous insufficiency 05/25/2019   PAD (peripheral artery disease) ([place]) 05/25/2019   Leg edema 05/02/2019   Episodic mood disorder ([place]) 63/14/9702   Complicated grief 63/78/5885   At high risk for falls 03/16/2019   Pelvic mass in female 03/16/2019   Compression fracture of L4 vertebra ([place]) 02/21/2019   Collapsed vertebra, not elsewhere classified, lumbar region, initial encounter for fracture ([place]) 02/21/2019   Anxiety 01/20/2019   Pain due to onychomycosis of toenails of both feet 12/05/2018   Hav (hallux abducto valgus), unspecified laterality 12/05/2018   Closed fracture of acromial process of scapula 10/28/2018   Status post reverse arthroplasty of shoulder, left 07/26/2018   Interstitial pulmonary disease ([place]) 07/07/2018   Fatigue 07/07/2018   Avascular necrosis of left shoulder due to adverse effect of steroid therapy ([place]) 01/20/2018   Other shoulder lesions, left shoulder 12/20/2017   Status post reverse total shoulder replacement, right 11/04/2017   Leukocytosis 10/19/2017   Allergic rhinitis 09/28/2017   Rotator cuff tendinitis, right 07/26/2017   Strain of right hip 02/26/2017   History of fracture of left hip 01/15/2017   Status post lumbar spinal fusion 01/15/2017   Dislocation of hip joint prosthesis ([place]) 01/08/2017   Status post hip hemiarthroplasty 01/08/2017   Leg swelling 12/15/2016   Age-related osteoporosis without current pathological fracture 11/05/2016   Hip fracture ([place]) 10/31/2016   Polyneuropathy 10/28/2016   Left foot pain 10/19/2016   Spinal stenosis of lumbar region 09/15/2016    Osteoporosis 08/27/2016   Drug-induced osteoporosis 08/27/2016   Adnexal mass 08/25/2016   Prediabetes 08/25/2016   Coronary atherosclerosis 08/25/2016   History of syncope 08/25/2016   Hypokalemia 08/25/2016   Mixed bipolar I disorder ([place]) 10/09/2015   Stage 3b chronic kidney disease (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   Macular degeneration 07/17/2015   Insomnia 07/17/2015   Rheumatoid arthritis ([place]) 12/05/2014   Other specified postprocedural states 07/20/2013   Anxiety and depression 06/13/2013   Osteoarthritis of knee 06/07/2013   Cough 05/02/2012   SOB (shortness of breath) 10/13/2011   Valvular heart disease 10/13/2011   Asthma-COPD overlap syndrome ([place]) 09/09/2011   OSA (obstructive sleep apnea) 09/09/2011   Nocturnal hypoxemia 09/09/2011   Gastroesophageal reflux disease 02/24/2011   Single kidney 02/24/2011   H/O cardiac catheterization 02/24/2011   Renal artery stenosis ([place]) 02/24/2011   PCP:  McLean-Scocuzza, Nino Glow, MD Pharmacy:   [id number] - [facility] - [place], [place] - [address] [place] [address] Phone: [phone] Fax: [phone]  [place], [place] [place] [address] Phone: [phone] Fax: [phone]  [id number] - [place], [place]  [address] [place] [address] Phone: [phone] Fax: [phone]  [id number] - [place], [place] [address] [place] [address] Phone: [phone] Fax: [phone]     Social Determinants of Health (SDOH) Interventions    Readmission Risk Interventions No flowsheet data found.

## 2021-01-13 NOTE — ED Notes (Signed)
MD at bedside. 

## 2021-01-13 NOTE — Evaluation (Signed)
Occupational Therapy Evaluation Patient Details Name: Kerri Carter MRN: [id number] DOB: [birth year] Today's Date: 01/13/2021   History of Present Illness Pt is a 80 y/o F admitted on 01/12/21 with c/c of SOB & cough. Pt is being treated for acute COPD exacerbation. PMH: anxiety, aortic stenosis, asthma, CKD, CVA, depression, GERD/PUD, retention, interstitial lung disease, PAD, bipolar disorder, conversion disorder, heart murmur, HLD   Clinical Impression   Pt was seen for OT evaluation this date. Prior to hospital admission, pt was modified independent with basic ADL, family managing transportation, housekeeping, meals, and medication mgt. Pt lives with family and endorses 2 falls/25mo. Currently pt demonstrates impairments in strength, activity tolerance, cardiopulmonary status as described below (See OT problem list) which functionally limit her ability to perform ADL/self-care tasks. Pt currently requires increased time/effort + AE for LB ADL. Pt instructed in energy conservation strategies including activity pacing, AE/DME, home/routines modifications, and PLB to support ADL/IADL participation while minimizing SOB/over exertion leading to readmission. Pt verbalized understanding. Pt would benefit from skilled OT services while hospitalized to address noted impairments and functional limitations (see below for any additional details) in order to maximize safety and independence while minimizing falls risk and caregiver burden. Upon hospital discharge, do not anticipate skilled OT needs at this time.    Recommendations for follow up therapy are one component of a multi-disciplinary discharge planning process, led by the attending physician.  Recommendations may be updated based on patient status, additional functional criteria and insurance authorization.   Follow Up Recommendations  No OT follow up    Assistance Recommended at Discharge PRN  Functional Status Assessment  Patient has had a recent  decline in their functional status and demonstrates the ability to make significant improvements in function in a reasonable and predictable amount of time.  Equipment Recommendations  Other (comment) (pulse oximeter)    Recommendations for Other Services       Precautions / Restrictions Precautions Precautions: Fall Restrictions Weight Bearing Restrictions: No      Mobility Bed Mobility Overal bed mobility: Needs Assistance Bed Mobility: Supine to Sit;Sit to Supine     Supine to sit: Min assist;HOB elevated Sit to supine: Min assist;HOB elevated        Transfers Overall transfer level: Needs assistance Equipment used: Rolling walker (2 wheels) Transfers: Sit to/from Stand Sit to Stand: Min guard                  Balance Overall balance assessment: Needs assistance Sitting-balance support: Bilateral upper extremity supported;Feet supported Sitting balance-Leahy Scale: Good     Standing balance support: Bilateral upper extremity supported;During functional activity Standing balance-Leahy Scale: Fair                             ADL either performed or assessed with clinical judgement   ADL Overall ADL's : Modified independent                                       General ADL Comments: Uses AE for LB ADL, increased time/effort     Vision         Perception     Praxis      Pertinent Vitals/Pain Pain Assessment: No/denies pain Faces Pain Scale: Hurts little more Pain Location: BLE feet - neuropathic pain Pain Descriptors / Indicators: Discomfort Pain Intervention(s): Monitored during session  Hand Dominance Right   Extremity/Trunk Assessment Upper Extremity Assessment Upper Extremity Assessment: Generalized weakness (decr RUE shoulder flexion ROM to ~90*)   Lower Extremity Assessment Lower Extremity Assessment: Generalized weakness       Communication Communication Communication: HOH   Cognition  Arousal/Alertness: Awake/alert Behavior During Therapy: WFL for tasks assessed/performed Overall Cognitive Status: Within Functional Limits for tasks assessed                                       General Comments  Pt assists pt with donning clean gown, pt able to doff soiled shirt without assistance.    Exercises Other Exercises Other Exercises: Pt instructed in energy conservation strategies including activity pacing, AE/DME, home/routines modifications, and PLB to support ADL/IADL participation while minimizing SOB/over exertion leading to readmission. Pt verbalized understanding.   Shoulder Instructions      Home Living Family/patient expects to be discharged to:: Private residence Living Arrangements: Children;Other relatives (granddtr, grandson, great granddtr) Available Help at Discharge: Family;Available PRN/intermittently Type of Home: House Home Access: Ramped entrance     Home Layout: One level     Bathroom Shower/Tub: Occupational psychologist: Handicapped height     Home Equipment: Rollator (4 wheels);BSC/3in1;Shower seat;Toilet riser;Grab bars - toilet;Hand held shower head;Grab bars - tub/shower;Adaptive equipment Adaptive Equipment: Reacher Additional Comments: Pt lives with grandchildren who work during the day.      Prior Functioning/Environment Prior Level of Function : Independent/Modified Independent;History of Falls (last six months)             Mobility Comments: Pt reports she requires PRN assistance for bed mobility, is able to complete stand pivot transfer bed<>BSC with mod I (which she uses during the day), requires PRN assistance for gait with rollator, endorses 2 falls in the past 6 months. ADLs Comments: Neighbor assists with meals during the day (heating up meals from insurance in the microwave) & grandchildren assist with meals in the evenings. Does not drive. Uses briefs for incontinence and BSC beside bed, family  assists with meds mgt        OT Problem List: Decreased activity tolerance;Decreased knowledge of use of DME or AE;Decreased strength;Cardiopulmonary status limiting activity      OT Treatment/Interventions: Self-care/ADL training;Therapeutic exercise;Therapeutic activities;Energy conservation;DME and/or AE instruction;Patient/family education    OT Goals(Current goals can be found in the care plan section) Acute Rehab OT Goals Patient Stated Goal: not over do it so I end up back in the ED OT Goal Formulation: With patient Time For Goal Achievement: 01/27/21 Potential to Achieve Goals: Good ADL Goals Additional ADL Goal #1: Pt will verbalize plan to implement at least 2 learned ECS into daily routines to minimize SOB. Additional ADL Goal #2: Pt will utilize learned ECS into daily ADL routines, 5/5 opportunities requiring PRN VC to utilize.  OT Frequency: Min 2X/week   Barriers to D/C:            Co-evaluation              AM-PAC OT "6 Clicks" Daily Activity     Outcome Measure Help from another person eating meals?: None Help from another person taking care of personal grooming?: None Help from another person toileting, which includes using toliet, bedpan, or urinal?: None Help from another person bathing (including washing, rinsing, drying)?: A Little Help from another person to put on and taking off regular  upper body clothing?: None Help from another person to put on and taking off regular lower body clothing?: A Little 6 Click Score: 22   End of Session    Activity Tolerance: Patient tolerated treatment well Patient left: in bed;with call bell/phone within reach  OT Visit Diagnosis: Other abnormalities of gait and mobility (R26.89);Muscle weakness (generalized) (M62.81)                Time: 2479-9800 OT Time Calculation (min): 19 min Charges:  OT General Charges $OT Visit: 1 Visit OT Evaluation $OT Eval Low Complexity: 1 Low OT Treatments $Self Care/Home  Management : 8-22 mins  Ardeth Perfect., MPH, MS, OTR/L ascom [phone] 01/13/21, 2:05 PM

## 2021-01-13 NOTE — Progress Notes (Signed)
Initial Nutrition Assessment  DOCUMENTATION CODES:   Obesity unspecified  INTERVENTION:   -Ensure Enlive po BID, each supplement provides 350 kcal and 20 grams of protein  -MVI with minerals daily  NUTRITION DIAGNOSIS:   Increased nutrient needs related to chronic illness (COPD) as evidenced by estimated needs.  GOAL:   Patient will meet greater than or equal to 90% of their needs  MONITOR:   PO intake, Supplement acceptance, Labs, Weight trends, Skin, I & O's  REASON FOR ASSESSMENT:   Consult Assessment of nutrition requirement/status  ASSESSMENT:   Kerri Carter is a 80 y.o. female seen in ed with complaints of cough, shortness of breath, for the past 3 days.  Symptoms worsening today and had specks of blood in her sputum.  Patient also noted that the shortness of breath was progressively getting worse.  Patient denies any headaches blurred vision speech or gait issues chest pain palpitations fevers or chills.  No abdominal pain diarrhea bowel movement changes or any urinary complaints.  Pt admitted with SOB, respiratory failure, and COPD exacerbation.   Reviewed I/O's: +3 ml x 24 hours  Pt unavailable at time of visit. RD unable to obtain further nutrition-related history or complete nutrition-focused physical exam at this time.    Pt is currently on a heart healthy diet. No meal completion data available to assess at this time.   Medications reviewed and include solu-medrol.   Reviewed wt hx; wt has experienced a 4% wt loss over the past 4 months, which is not significant for time frame.   Labs reviewed: Na: 133.    Diet Order:   Diet Order             Diet Heart Room service appropriate? Yes; Fluid consistency: Thin  Diet effective now                   EDUCATION NEEDS:   No education needs have been identified at this time  Skin:  Skin Assessment: Reviewed RN Assessment  Last BM:  Unknown  Height:   Ht Readings from Last 1 Encounters:   01/12/21 5\' 1"  (1.549 m)    Weight:   Wt Readings from Last 1 Encounters:  01/12/21 81.6 kg    Ideal Body Weight:  47.7 kg  BMI:  Body mass index is 34.01 kg/m.  Estimated Nutritional Needs:   Kcal:  1500-1700  Protein:  80-95 grams  Fluid:  > 1.5 L    Loistine Chance, RD, LDN, [place] Registered Dietitian II Certified Diabetes Care and Education Specialist Please refer to [facility] for RD and/or RD on-call/weekend/after hours pager

## 2021-01-13 NOTE — Progress Notes (Signed)
PROGRESS NOTE  Kerri Carter    DOB: 01/13/1941, 80 y.o.  ATF:[id number]  PCP: McLean-Scocuzza, Nino Glow, MD   Code Status: Full Code   DOA: 01/12/2021   LOS: 0  Brief Narrative of Current Hospitalization  Kerri Carter is a 80 y.o. female with a PMH significant for asthma/COPD, CKD III with a single kidney, CVA, depression/anxiety, GERD/PUD, PAD, aortic stenosis, HTN, OSA, RA, HLD, bipolar 1, polyneuropathy, osteoporosis. They presented from home to the ED on 01/12/2021 with SOB and cough x 3 days. In the ED, it was found that they had hypoxia.  Patient does not require oxygen at baseline.  They were treated with supplemental oxygen, respiratory treatments, antibiotics, steroids.  Evaluation for DVT was negative. Patient was admitted to medicine service for further workup and management of COPD exacerbation as outlined in detail below.  01/13/21 -stable  Assessment & Plan  Principal Problem:   SOB (shortness of breath) Active Problems:   Essential hypertension   COPD exacerbation (HCC)   Respiratory failure with hypoxia (HCC)   Acute respiratory failure with hypoxia (HCC)  COPD exacerbation-on 2 L this morning but was able to be weaned to room air. No oxygen requirement at baseline. O2 saturation goal is 88-92%. Patient's O2 saturations above 92% at rest ORA. -Ambulate with pulse ox - continue scheduled duonebs and ICS inhalers - continue PO steroids - continue IV Abx.  - if she can ambulate without desat later today, could potentially be discharged today.   CKD - avoid nephrotoxic agents  CVA  depression  anxiety - continue home medications  HTN  HLD- - continue home medications  Foot pain- has h/o peripheral neuropathy. Potentially charcot foot - continue gabapentin - f/u podiatry OP   DVT prophylaxis: heparin injection 5,000 Units Start: 01/12/21 2245   Diet:  Diet Orders (From admission, onward)     Start     Ordered   01/13/21 0001  Diet NPO time specified   Diet effective midnight        01/12/21 2243            Subjective 01/13/21    Pt reports feeling much improved. Denies SOB at rest. Biggest complaint is chronic bilateral foot pain.   Disposition Plan & Communication  Patient status: Observation  Admitted From: Home Disposition: Home Anticipated discharge date: 12/12-13  Family Communication: none  Consults, Procedures, Significant Events  Consultants:  none  Procedures/significant events:  None  Antimicrobials:  Anti-infectives (From admission, onward)    Start     Dose/Rate Route Frequency Ordered Stop   01/13/21 0000  azithromycin (ZITHROMAX) tablet 500 mg        500 mg Oral Daily 01/12/21 2254 01/17/21 0959       Objective   Vitals:   01/12/21 1853 01/12/21 2030 01/13/21 0205 01/13/21 0514  BP: (!) 145/77 (!) 146/65 (!) 124/54 (!) 127/58  Pulse: (!) 118 (!) 102 95 94  Resp: (!) 24 12 16 20   Temp:      TempSrc:      SpO2: 97% 95% 94% 93%  Weight:      Height:        Intake/Output Summary (Last 24 hours) at 01/13/2021 0722 Last data filed at 01/13/2021 0029 Gross per 24 hour  Intake 3 ml  Output --  Net 3 ml   Filed Weights   01/12/21 1320  Weight: 81.6 kg    Patient BMI: Body mass index is 34.01 kg/m.   Physical Exam:  General: awake, alert, NAD HEENT: atraumatic, clear conjunctiva, anicteric sclera, MMM, hearing grossly normal Respiratory: normal respiratory effort. Minimal inspiratory wheezing Cardiovascular: normal S1/S2, RRR, no JVD, murmurs, quick capillary refill  Nervous: A&O x3. no gross focal neurologic deficits, normal speech Extremities: moves all equally, no edema, normal tone. Bilateral foot deformities  Skin: dry, intact, normal temperature, normal color. No rashes, lesions or ulcers on exposed skin Psychiatry: normal mood, congruent affect  Labs   I have personally reviewed following labs and imaging studies Admission on 01/12/2021  Component Date Value Ref Range Status    WBC 01/12/2021 15.9 (H)  4.0 - 10.5 K/uL Final   RBC 01/12/2021 4.72  3.87 - 5.11 MIL/uL Final   Hemoglobin 01/12/2021 13.3  12.0 - 15.0 g/dL Final   HCT 01/12/2021 42.1  36.0 - 46.0 % Final   MCV 01/12/2021 89.2  80.0 - 100.0 fL Final   MCH 01/12/2021 28.2  26.0 - 34.0 pg Final   MCHC 01/12/2021 31.6  30.0 - 36.0 g/dL Final   RDW 01/12/2021 15.0  11.5 - 15.5 % Final   Platelets 01/12/2021 273  150 - 400 K/uL Final   nRBC 01/12/2021 0.0  0.0 - 0.2 % Final   Sodium 01/12/2021 131 (L)  135 - 145 mmol/L Final   Potassium 01/12/2021 3.7  3.5 - 5.1 mmol/L Final   Chloride 01/12/2021 96 (L)  98 - 111 mmol/L Final   CO2 01/12/2021 26  22 - 32 mmol/L Final   Glucose, Bld 01/12/2021 178 (H)  70 - 99 mg/dL Final   BUN 01/12/2021 18  8 - 23 mg/dL Final   Creatinine, Ser 01/12/2021 1.36 (H)  0.44 - 1.00 mg/dL Final   Calcium 01/12/2021 8.9  8.9 - 10.3 mg/dL Final   GFR, Estimated 01/12/2021 40 (L)  >60 mL/min Final   Anion gap 01/12/2021 9  5 - 15 Final   SARS Coronavirus 2 by RT PCR 01/12/2021 NEGATIVE  NEGATIVE Final   Influenza A by PCR 01/12/2021 NEGATIVE  NEGATIVE Final   Influenza B by PCR 01/12/2021 NEGATIVE  NEGATIVE Final   Sodium 01/13/2021 133 (L)  135 - 145 mmol/L Final   Potassium 01/13/2021 4.1  3.5 - 5.1 mmol/L Final   Chloride 01/13/2021 98  98 - 111 mmol/L Final   CO2 01/13/2021 27  22 - 32 mmol/L Final   Glucose, Bld 01/13/2021 257 (H)  70 - 99 mg/dL Final   BUN 01/13/2021 18  8 - 23 mg/dL Final   Creatinine, Ser 01/13/2021 1.27 (H)  0.44 - 1.00 mg/dL Final   Calcium 01/13/2021 8.5 (L)  8.9 - 10.3 mg/dL Final   GFR, Estimated 01/13/2021 43 (L)  >60 mL/min Final   Anion gap 01/13/2021 8  5 - 15 Final   WBC 01/13/2021 11.2 (H)  4.0 - 10.5 K/uL Final   RBC 01/13/2021 4.17  3.87 - 5.11 MIL/uL Final   Hemoglobin 01/13/2021 11.5 (L)  12.0 - 15.0 g/dL Final   HCT 01/13/2021 36.9  36.0 - 46.0 % Final   MCV 01/13/2021 88.5  80.0 - 100.0 fL Final   MCH 01/13/2021 27.6  26.0 - 34.0  pg Final   MCHC 01/13/2021 31.2  30.0 - 36.0 g/dL Final   RDW 01/13/2021 15.0  11.5 - 15.5 % Final   Platelets 01/13/2021 242  150 - 400 K/uL Final   nRBC 01/13/2021 0.0  0.0 - 0.2 % Final   WBC 01/12/2021 14.1 (H)  4.0 - 10.5 K/uL  Final   RBC 01/12/2021 4.35  3.87 - 5.11 MIL/uL Final   Hemoglobin 01/12/2021 12.3  12.0 - 15.0 g/dL Final   HCT 01/12/2021 38.6  36.0 - 46.0 % Final   MCV 01/12/2021 88.7  80.0 - 100.0 fL Final   MCH 01/12/2021 28.3  26.0 - 34.0 pg Final   MCHC 01/12/2021 31.9  30.0 - 36.0 g/dL Final   RDW 01/12/2021 14.9  11.5 - 15.5 % Final   Platelets 01/12/2021 267  150 - 400 K/uL Final   nRBC 01/12/2021 0.0  0.0 - 0.2 % Final   Creatinine, Ser 01/12/2021 1.29 (H)  0.44 - 1.00 mg/dL Final   GFR, Estimated 01/12/2021 42 (L)  >60 mL/min Final   D-Dimer, Quant 01/12/2021 1.40 (H)  0.00 - 0.50 ug/mL-FEU Final   pH, Ven 01/12/2021 7.46 (H)  7.250 - 7.430 Final   pCO2, Ven 01/12/2021 43 (L)  44.0 - 60.0 mmHg Final   pO2, Ven 01/12/2021 54.0 (H)  32.0 - 45.0 mmHg Final   Bicarbonate 01/12/2021 30.6 (H)  20.0 - 28.0 mmol/L Final   Acid-Base Excess 01/12/2021 6.1 (H)  0.0 - 2.0 mmol/L Final   O2 Saturation 01/12/2021 89.5  % Final   Patient temperature 01/12/2021 37.0   Final   Collection site 01/12/2021 VEIN   Final   Sample type 01/12/2021 VENOUS   Final    Imaging Studies  DG Chest 2 View  Result Date: 01/12/2021 CLINICAL DATA:  Productive cough, hemoptysis. EXAM: CHEST - 2 VIEW COMPARISON:  Chest radiograph dated December 04, 2020 FINDINGS: The heart size and mediastinal contours are within normal limits. Hyperinflated lungs with increase in AP diameter of the chest. No focal consolidation or pleural effusion. Chronic healed right-sided rib fractures. Bilateral shoulder arthroplasty. IMPRESSION: COPD without evidence of acute cardiopulmonary process. Electronically Signed   By: Keane Police D.O.   On: 01/12/2021 13:57   US Venous Img Lower Bilateral (DVT)  Result Date:  01/12/2021 CLINICAL DATA:  Bilateral lower extremity pain. EXAM: Bilateral LOWER EXTREMITY VENOUS DOPPLER ULTRASOUND TECHNIQUE: Gray-scale sonography with compression, as well as color and duplex ultrasound, were performed to evaluate the deep venous system(s) from the level of the common femoral vein through the popliteal and proximal calf veins. COMPARISON:  Ultrasound dated 12/18/2016. FINDINGS: VENOUS Normal compressibility of the common femoral, superficial femoral, and popliteal veins, as well as the visualized calf veins. Visualized portions of profunda femoral vein and great saphenous vein unremarkable. No filling defects to suggest DVT on grayscale or color Doppler imaging. Doppler waveforms show normal direction of venous flow, normal respiratory plasticity and response to augmentation. Limited views of the contralateral common femoral vein are unremarkable. OTHER None. Limitations: none IMPRESSION: Negative. Electronically Signed   By: Anner Crete M.D.   On: 01/12/2021 23:37    Medications   Scheduled Meds:  amLODipine  2.5 mg Oral Daily   azithromycin  500 mg Oral Daily   budesonide  0.25 mg Nebulization Daily   escitalopram  10 mg Oral Daily   fluticasone  2 spray Each Nare Daily   heparin  5,000 Units Subcutaneous Q8H   ipratropium-albuterol  3 mL Nebulization QID   lamoTRIgine  100 mg Oral BID   leflunomide  20 mg Oral Daily   methylPREDNISolone (SOLU-MEDROL) injection  60 mg Intravenous Q12H   Followed by   Derrill Memo ON 01/14/2021] predniSONE  40 mg Oral Q breakfast   mirabegron ER  50 mg Oral Daily   montelukast  10 mg Oral Daily   nebivolol  5 mg Oral Daily   pantoprazole (PROTONIX) IV  40 mg Intravenous Q24H   pravastatin  40 mg Oral q1800   QUEtiapine  25 mg Oral QHS   sodium chloride flush  3 mL Intravenous Q12H   No recently discontinued medications to reconcile  LOS: 0 days   Time spent: >60min  Richarda Osmond, DO [facility] 01/13/2021, 7:22 AM    Available by Epic secure chat 7AM-7PM. If 7PM-7AM, please contact night-coverage Refer to [email or web address] to contact the [facility] Attending or Consulting provider for this pt

## 2021-01-13 NOTE — ED Notes (Signed)
PT at bedside to ambulate pt, Pt up with walker ;noted O2 sats 95-96% RA, HR 96

## 2021-01-13 NOTE — Evaluation (Signed)
Physical Therapy Evaluation Patient Details Name: Kerri Carter MRN: [id number] DOB: 1940/02/08 Today's Date: 01/13/2021  History of Present Illness  Pt is a 80 y/o F admitted on 01/12/21 with c/c of SOB & cough. Pt is being treated for acute COPD exacerbation. PMH: anxiety, aortic stenosis, asthma, CKD, CVA, depression, GERD/PUD, retention, interstitial lung disease, PAD, bipolar disorder, conversion disorder, heart murmur, HLD  Clinical Impression  Pt seen for PT evaluation with pt agreeable. Pt reports she primarily stays in bed & uses BSC throughout the day, neighbor assists with meals during the day, grandchildren assists with meals in the evening, & pt ambulates with rollator. Pt received on room air with SpO2 85% but able to recover to >/= 90% throughout remainder of session with cuing for pursed lip breathing. On this date, pt requires min assist for bed mobility, min assist for transfers, & CGA for short distance gait in room with RW. Pt requires seated rest break 2/2 self reported fatigue & SpO2 despite SPO2 100%. Pt appears close to her baseline level of function, but will continue to see pt acutely to progress endurance & gait distance.        Recommendations for follow up therapy are one component of a multi-disciplinary discharge planning process, led by the attending physician.  Recommendations may be updated based on patient status, additional functional criteria and insurance authorization.  Follow Up Recommendations Home health PT    Assistance Recommended at Discharge Intermittent Supervision/Assistance  Functional Status Assessment Patient has not had a recent decline in their functional status  Equipment Recommendations  None recommended by PT    Recommendations for Other Services       Precautions / Restrictions Precautions Precautions: Fall Restrictions Weight Bearing Restrictions: No      Mobility  Bed Mobility Overal bed mobility: Needs Assistance Bed  Mobility: Supine to Sit;Sit to Supine     Supine to sit: Min assist;HOB elevated Sit to supine: Min assist;HOB elevated        Transfers Overall transfer level: Needs assistance Equipment used: Rolling walker (2 wheels) Transfers: Sit to/from Stand Sit to Stand: Min guard                Ambulation/Gait Ambulation/Gait assistance: Counsellor (Feet): 10 Feet Assistive device: Rolling walker (2 wheels) Gait Pattern/deviations: Decreased step length - left;Decreased step length - right;Decreased stride length;Decreased dorsiflexion - right;Decreased dorsiflexion - left;Trunk flexed Gait velocity: decreased        Stairs            Wheelchair Mobility    Modified Rankin (Stroke Patients Only)       Balance Overall balance assessment: Needs assistance Sitting-balance support: Bilateral upper extremity supported;Feet supported Sitting balance-Leahy Scale: Good     Standing balance support: Bilateral upper extremity supported;During functional activity Standing balance-Leahy Scale: Fair                               Pertinent Vitals/Pain Pain Assessment: Faces Faces Pain Scale: Hurts little more Pain Location: BLE feet - neuropathic pain Pain Descriptors / Indicators: Discomfort Pain Intervention(s): Monitored during session    Home Living Family/patient expects to be discharged to:: Private residence Living Arrangements: Children;Other relatives Available Help at Discharge: Family;Available PRN/intermittently Type of Home: House Home Access: Ramped entrance       Home Layout: One level Home Equipment: Rollator (4 wheels);BSC/3in1 Additional Comments: Pt lives with grandchildren who work  during the day.    Prior Function Prior Level of Function : Independent/Modified Independent             Mobility Comments: Pt reports she requires PRN assistance for bed mobility, is able to complete stand pivot transfer bed<>BSC  with mod I (which she uses during the day), requires PRN assistance for gait with rollator, endorses 2 falls in the past 6 months. ADLs Comments: Neighbor assists with meals during the day (heating up meals from insurance in the microwave) & grandchildren assist with meals in the evenings.     Hand Dominance   Dominant Hand: Right    Extremity/Trunk Assessment   Upper Extremity Assessment Upper Extremity Assessment: Generalized weakness    Lower Extremity Assessment Lower Extremity Assessment: Generalized weakness       Communication   Communication: HOH  Cognition Arousal/Alertness: Awake/alert Behavior During Therapy: WFL for tasks assessed/performed Overall Cognitive Status: Within Functional Limits for tasks assessed                                          General Comments General comments (skin integrity, edema, etc.): Pt assists pt with donning clean gown, pt able to doff soiled shirt without assistance.    Exercises     Assessment/Plan    PT Assessment Patient needs continued PT services  PT Problem List Decreased strength;Cardiopulmonary status limiting activity;Decreased activity tolerance;Decreased knowledge of use of DME;Decreased balance;Decreased mobility       PT Treatment Interventions DME instruction;Therapeutic exercise;Gait training;Balance training;Neuromuscular re-education;Functional mobility training;Therapeutic activities;Patient/family education    PT Goals (Current goals can be found in the Care Plan section)  Acute Rehab PT Goals Patient Stated Goal: get better PT Goal Formulation: With patient Time For Goal Achievement: 01/27/21 Potential to Achieve Goals: Good    Frequency Min 2X/week   Barriers to discharge Decreased caregiver support      Co-evaluation               AM-PAC PT "6 Clicks" Mobility  Outcome Measure Help needed turning from your back to your side while in a flat bed without using bedrails?: A  Little Help needed moving from lying on your back to sitting on the side of a flat bed without using bedrails?: A Little Help needed moving to and from a bed to a chair (including a wheelchair)?: A Little Help needed standing up from a chair using your arms (e.g., wheelchair or bedside chair)?: A Little Help needed to walk in hospital room?: A Little Help needed climbing 3-5 steps with a railing? : A Lot 6 Click Score: 17    End of Session Equipment Utilized During Treatment: Gait belt Activity Tolerance: Patient limited by fatigue Patient left: in bed;with call bell/phone within reach Nurse Communication: Mobility status (O2) PT Visit Diagnosis: Muscle weakness (generalized) (M62.81)    Time: 4196-2229 PT Time Calculation (min) (ACUTE ONLY): 17 min   Charges:   PT Evaluation $PT Eval Low Complexity: [place], PT, DPT 01/13/21, 1:34 PM   [name] 01/13/2021, 1:30 PM

## 2021-01-15 LAB — CULTURE, RESPIRATORY W GRAM STAIN: Culture: NORMAL

## 2021-01-27 NOTE — Assessment & Plan Note (Addendum)
-   Stable interval; Chronic dyspnea symptoms with no acute complaints. Last exacerbation was in September requiring hospitalization. She is complaint with Pulmicort BID and Ipratropium-albuterol nebulizer TID prn breakthrough symptoms. From pulmonary standpoint she is optimized for surgery with Dr. [name]. Pre-op CXR shwoed no acute findings. She is considered medium risk for prolonged mech ventilation and/or post op pulmonary complications d.t hx COPD/sthma overlap, ILD and nocturnal hypoxemia. Ultimate clearance will be decided by surgeon.

## 2021-01-27 NOTE — Assessment & Plan Note (Signed)
-   Continue 2L oxygen at night

## 2021-01-28 ENCOUNTER — Other Ambulatory Visit: Payer: Self-pay | Admitting: Internal Medicine

## 2021-01-29 NOTE — Telephone Encounter (Signed)
Last OV 11/23/20 last refill 12/04/20 okay to fill?

## 2021-02-10 ENCOUNTER — Other Ambulatory Visit: Payer: Self-pay

## 2021-02-10 ENCOUNTER — Emergency Department
Admission: EM | Admit: 2021-02-10 | Discharge: 2021-02-11 | Disposition: A | Discharge status: Home / Self Care | Payer: Medicare PPO | Attending: Emergency Medicine | Admitting: Emergency Medicine

## 2021-02-10 ENCOUNTER — Telehealth: Payer: Self-pay | Admitting: Internal Medicine

## 2021-02-10 ENCOUNTER — Emergency Department: Payer: Medicare PPO

## 2021-02-10 DIAGNOSIS — R410 Disorientation, unspecified: Secondary | ICD-10-CM | POA: Diagnosis not present

## 2021-02-10 DIAGNOSIS — I959 Hypotension, unspecified: Secondary | ICD-10-CM | POA: Diagnosis not present

## 2021-02-10 DIAGNOSIS — N390 Urinary tract infection, site not specified: Secondary | ICD-10-CM | POA: Insufficient documentation

## 2021-02-10 DIAGNOSIS — N83202 Unspecified ovarian cyst, left side: Secondary | ICD-10-CM | POA: Diagnosis not present

## 2021-02-10 DIAGNOSIS — R4182 Altered mental status, unspecified: Secondary | ICD-10-CM

## 2021-02-10 DIAGNOSIS — R109 Unspecified abdominal pain: Secondary | ICD-10-CM | POA: Diagnosis not present

## 2021-02-10 DIAGNOSIS — M549 Dorsalgia, unspecified: Secondary | ICD-10-CM | POA: Diagnosis not present

## 2021-02-10 DIAGNOSIS — Z905 Acquired absence of kidney: Secondary | ICD-10-CM | POA: Diagnosis not present

## 2021-02-10 DIAGNOSIS — J449 Chronic obstructive pulmonary disease, unspecified: Secondary | ICD-10-CM | POA: Insufficient documentation

## 2021-02-10 DIAGNOSIS — Z20822 Contact with and (suspected) exposure to covid-19: Secondary | ICD-10-CM | POA: Insufficient documentation

## 2021-02-10 DIAGNOSIS — I700 Atherosclerosis of aorta: Secondary | ICD-10-CM | POA: Diagnosis not present

## 2021-02-10 DIAGNOSIS — M5432 Sciatica, left side: Secondary | ICD-10-CM | POA: Diagnosis not present

## 2021-02-10 DIAGNOSIS — N3090 Cystitis, unspecified without hematuria: Secondary | ICD-10-CM | POA: Diagnosis not present

## 2021-02-10 DIAGNOSIS — S199XXA Unspecified injury of neck, initial encounter: Secondary | ICD-10-CM | POA: Diagnosis not present

## 2021-02-10 LAB — CBC
HCT: 40.6 % (ref 36.0–46.0)
Hemoglobin: 12.3 g/dL (ref 12.0–15.0)
MCH: 28.1 pg (ref 26.0–34.0)
MCHC: 30.3 g/dL (ref 30.0–36.0)
MCV: 92.7 fL (ref 80.0–100.0)
Platelets: 212 10*3/uL (ref 150–400)
RBC: 4.38 MIL/uL (ref 3.87–5.11)
RDW: 15.1 % (ref 11.5–15.5)
WBC: 11.5 10*3/uL — ABNORMAL HIGH (ref 4.0–10.5)
nRBC: 0 % (ref 0.0–0.2)

## 2021-02-10 LAB — URINALYSIS, COMPLETE (UACMP) WITH MICROSCOPIC
Bilirubin Urine: NEGATIVE
Glucose, UA: NEGATIVE mg/dL
Ketones, ur: NEGATIVE mg/dL
Leukocytes,Ua: NEGATIVE
Nitrite: POSITIVE — AB
Protein, ur: NEGATIVE mg/dL
Specific Gravity, Urine: 1.019 (ref 1.005–1.030)
pH: 5 (ref 5.0–8.0)

## 2021-02-10 LAB — BLOOD GAS, VENOUS
Acid-Base Excess: 2.8 mmol/L — ABNORMAL HIGH (ref 0.0–2.0)
Bicarbonate: 27.9 mmol/L (ref 20.0–28.0)
O2 Saturation: 69.7 %
Patient temperature: 37
pCO2, Ven: 44 mmHg (ref 44.0–60.0)
pH, Ven: 7.41 (ref 7.250–7.430)
pO2, Ven: 36 mmHg (ref 32.0–45.0)

## 2021-02-10 LAB — COMPREHENSIVE METABOLIC PANEL
ALT: 16 U/L (ref 0–44)
AST: 22 U/L (ref 15–41)
Albumin: 3.3 g/dL — ABNORMAL LOW (ref 3.5–5.0)
Alkaline Phosphatase: 90 U/L (ref 38–126)
Anion gap: 10 (ref 5–15)
BUN: 19 mg/dL (ref 8–23)
CO2: 23 mmol/L (ref 22–32)
Calcium: 8.4 mg/dL — ABNORMAL LOW (ref 8.9–10.3)
Chloride: 108 mmol/L (ref 98–111)
Creatinine, Ser: 1.13 mg/dL — ABNORMAL HIGH (ref 0.44–1.00)
GFR, Estimated: 50 mL/min — ABNORMAL LOW (ref >60)
Glucose, Bld: 120 mg/dL — ABNORMAL HIGH (ref 70–99)
Potassium: 4 mmol/L (ref 3.5–5.1)
Sodium: 141 mmol/L (ref 135–145)
Total Bilirubin: 0.6 mg/dL (ref 0.3–1.2)
Total Protein: 6.3 g/dL — ABNORMAL LOW (ref 6.5–8.1)

## 2021-02-10 LAB — RESP PANEL BY RT-PCR (FLU A&B, COVID) ARPGX2
Influenza A by PCR: NEGATIVE
Influenza B by PCR: NEGATIVE
SARS Coronavirus 2 by RT PCR: NEGATIVE

## 2021-02-10 LAB — TROPONIN I (HIGH SENSITIVITY): Troponin I (High Sensitivity): 17 ng/L (ref <18)

## 2021-02-10 MED ORDER — ACETAMINOPHEN 500 MG PO TABS
1000.0000 mg | ORAL_TABLET | Freq: Once | ORAL | Status: AC
  Administered 2021-02-10: 1000 mg via ORAL
  Filled 2021-02-10: qty 2

## 2021-02-10 MED ORDER — SODIUM CHLORIDE 0.9 % IV BOLUS
500.0000 mL | Freq: Once | INTRAVENOUS | Status: AC
  Administered 2021-02-10: 500 mL via INTRAVENOUS

## 2021-02-10 MED ORDER — FOSFOMYCIN TROMETHAMINE 3 G PO PACK
3.0000 g | PACK | Freq: Once | ORAL | Status: AC
  Administered 2021-02-10: 3 g via ORAL
  Filled 2021-02-10: qty 3

## 2021-02-10 NOTE — Telephone Encounter (Signed)
Noted inform Richard they can check urine at hospital  She was rec to establish with [facility] in [place] and last visit information given to son she needs a psychiatrist long term  Also they can consult psychiatry inpatient if needed  Also surgeon for her back is Dr. Elayne Guerin he can call him as well

## 2021-02-10 NOTE — Telephone Encounter (Signed)
Patient currently admitted in the hospital.  

## 2021-02-10 NOTE — Telephone Encounter (Signed)
Richard (caregiver and grandson) called in stating the pt is delusional and it started yesterday. Pt grandson stated that the pt has a lot of pain in her lower back since Friday. Pt grandson also stated pt has pain down her leg which started a couple weeks ago.They was told by the doctor that it was nerve pain issue. Pt grandson said that pt only has one kidney and he just wanted to get her checked out. sent to access nurse

## 2021-02-10 NOTE — Telephone Encounter (Signed)
Patient currently still in the hospital

## 2021-02-10 NOTE — Discharge Instructions (Signed)
We have covered her with some antibiotics for possible UTI but her urine culture will be available in 2 to 3 days.  Please follow-up with her primary care doctors to discuss these results.  If she has return of her altered mental status and is worsening please return to the ER immediately so we can reevaluate her.  I's wonder if this could be secondary to some of her pain and hopefully after tomorrow she will feel better.  However if there is any other concerns please return to the ER for repeat evaluation

## 2021-02-10 NOTE — ED Triage Notes (Signed)
Pt comes via EMs with c/o AMs. Pt's grandson states LKW was Saturday. Pt now confused and thinking they are at a hotel room. Pt also has not urinated today. Pt is mobile with walker but last few days that has declined.  Unknown if odor present . Pt does state urge to pee  BP-124/50 HR-74 149-CBG

## 2021-02-10 NOTE — ED Provider Notes (Signed)
[facility] Provider Note    Event Date/Time   First MD Initiated Contact with Patient 02/10/21 1747     (approximate)   History   Altered Mental Status   HPI  Kerri Carter is a 81 y.o. female with history of bipolar, CKD, interstitial lung disease, COPD on 2 L of oxygen at nighttime who comes in with concerns for altered mental status.  Patient reports last known well was on Saturday.  Patient is present with the grandson.  According to the grandson he is she is had some off and on confusion where she has been talking to her dead parents and not knowing where she has been at.  This is abnormal for her.  He reports some decreased urination as well.  She had not urinated for some amount of time and only has 1 kidney so they were concerned about that as well.  He does report that yesterday he did find her down on the ground.  He states that must of been less than an hour since he had just seen her.  She also reports having some pain in her left SI joint with pain radiating down her legs.  She reports having surgery with Dr. Cari Caraway and is post to get an injection but was never able to get follow-up secondary to the significant pain and not wanting to walk.  She is currently been using lidocaine patches and some muscle relaxants.  Grandson states that the muscle relaxants do not seem to be linked to when she has the confusion.  He reports that the confusion has not been as bad today.  Is very minimal at this time.     Physical Exam   Triage Vital Signs: ED Triage Vitals  Enc Vitals Group     BP 02/10/21 1110 (!) 106/49     Pulse Rate 02/10/21 1110 74     Resp 02/10/21 1110 16     Temp 02/10/21 1110 98.5 F (36.9 C)     Temp Source 02/10/21 1110 Oral     SpO2 02/10/21 1110 92 %     Weight --      Height --      Head Circumference --      Peak Flow --      Pain Score 02/10/21 1049 0     Pain Loc --      Pain Edu? --      Excl. in [place]? --     Most  recent vital signs: Vitals:   02/10/21 1110 02/10/21 1623  BP: (!) 106/49 125/66  Pulse: 74 75  Resp: 16 20  Temp: 98.5 F (36.9 C) 97.9 F (36.6 C)  SpO2: 92% 93%     General: Awake, no distress.  Pleasant elderly female CV:  Good peripheral perfusion.  Resp:  Normal effort.  Clear lungs Abd:  No distention.  Soft and nontender Psychiatric: No SI or HI, concern for some intermittent confusion Neurological: Equal strength in arms and legs.  Cranial nerves appear intact.  Alert and oriented x2.  Does not know the year. Musculoskeletal: Able to lift both legs up off the bed but does report pain in her left SI area.  Sensation intact in her legs.    ED Results / Procedures / Treatments   Labs (all labs ordered are listed, but only abnormal results are displayed) Labs Reviewed  COMPREHENSIVE METABOLIC PANEL - Abnormal; Notable for the following components:      Result  Value   Glucose, Bld 120 (*)    Creatinine, Ser 1.13 (*)    Calcium 8.4 (*)    Total Protein 6.3 (*)    Albumin 3.3 (*)    GFR, Estimated 50 (*)    All other components within normal limits  CBC - Abnormal; Notable for the following components:   WBC 11.5 (*)    All other components within normal limits  URINALYSIS, COMPLETE (UACMP) WITH MICROSCOPIC  CBG MONITORING, ED     EKG  My interpretation of EKG:  EKG my interpretation is rate of 77 but is difficult to tell if it is sinus or not due to difficulty appreciating the P waves.  No ST elevation or T wave inversions, we will get a repeat EKG just to confirm  EKG my interpretation is sinus rate of 66 without any ST elevation or T wave inversions except for aVL with normal intervals  RADIOLOGY I have reviewed the  CT no evidence of intercranial hemorrhage.  No evidence of nephrolithiasis.  Has a right nephrectomy   PROCEDURES:  Critical Care performed: No  .1-3 Lead EKG Interpretation Performed by: Vanessa Jauc[place], MD Authorized by: Vanessa Max,  MD     Interpretation: normal     ECG rate:  60s   ECG rate assessment: normal     Rhythm: sinus rhythm     Ectopy: none     Conduction: normal     MEDICATIONS ORDERED IN ED: Medications  sodium chloride 0.9 % bolus 500 mL (0 mLs Intravenous Stopped 02/10/21 1930)  acetaminophen (TYLENOL) tablet 1,000 mg (1,000 mg Oral Given 02/10/21 2346)  fosfomycin (MONUROL) packet 3 g (3 g Oral Given 02/10/21 2341)     IMPRESSION / MDM / ASSESSMENT AND PLAN / ED COURSE  I reviewed the triage vital signs and the nursing notes.  Patient has a history of back pain requiring decompression followed by neurosurgery who comes in with concerns for confusion by the son noted yesterday.  Patient is followed by psychiatry but denies any SI or HI.  No active evidence of psychosis.  She needs to follow-up outpatient with psychiatry.  Differential diagnosis includes, but is not limited to, confusion possibly secondary to electrolyte abnormalities, AKI, UTI, intracranial hemorrhage.  No obvious evidence of stroke upon examination.  Given the back pain consider kidney stone.  We will get CT imaging to evaluate.  Will get CT head evaluate for intercranial hemorrhage  Patient's labs show a white count of 11 which is slightly elevated.  No evidence of anemia.  Electrolytes are reassuring with normal white count.  We did a bladder scan that does not show significant retention.  Patient has no evidence of cord compression on exam.  She is able to move her legs although does report pain in her left SI joint, no numbness that is new.  No evidence of bladder retention.  Postvoid residual was 100 cc.  She had CT imaging that was all reassuring.  At this time she is closer to her baseline self.  She is not oriented to the time but she is otherwise able to have a full conversation with me and son.  Son also reports her being very close to baseline.  We discussed admission versus going home and son felt comfortable taking her home at  this time especially given the fact that she has an appointment tomorrow for PMR for her back pain.  I explained that maybe her back pain is causing her  not to sleep well at night.  She does report waking up a lot and maybe this is causing a little bit of delirium during the day.  However at this time she is near her baseline and much improved from yesterday and her work-up was otherwise reassuring without any evidence of stroke.  There was a little bit of nitrites in her urine.  She is a lot of allergies and given her age and her kidney function not a great candidate for Macrobid therefore we will give 1 dose of fosfomycin after discussing with pharmacy that is probably her best option.       FINAL CLINICAL IMPRESSION(S) / ED DIAGNOSES   Final diagnoses:  Altered mental status, unspecified altered mental status type  Urinary tract infection without hematuria, site unspecified  Sciatica of left side     Rx / DC Orders   ED Discharge Orders     None        Note:  This document was prepared using Dragon voice recognition software and may include unintentional dictation errors.   Vanessa [place], MD 02/10/21 [phone]

## 2021-02-12 LAB — URINE CULTURE: Culture: >=100000 — AB

## 2021-02-17 ENCOUNTER — Ambulatory Visit: Payer: Medicare PPO

## 2021-02-27 ENCOUNTER — Encounter: Payer: Self-pay | Admitting: Internal Medicine

## 2021-02-27 ENCOUNTER — Other Ambulatory Visit (HOSPITAL_COMMUNITY)
Admission: RE | Admit: 2021-02-27 | Discharge: 2021-02-27 | Disposition: A | Discharge status: Home / Self Care | Payer: Medicare PPO | Source: Ambulatory Visit | Attending: Internal Medicine | Admitting: Internal Medicine

## 2021-02-27 ENCOUNTER — Other Ambulatory Visit: Payer: Self-pay

## 2021-02-27 ENCOUNTER — Ambulatory Visit: Payer: Medicare PPO | Admitting: Internal Medicine

## 2021-02-27 VITALS — BP 138/80 | HR 80 | Temp 98.1°F | Ht 61.0 in | Wt 172.4 lb

## 2021-02-27 DIAGNOSIS — B3731 Acute candidiasis of vulva and vagina: Secondary | ICD-10-CM

## 2021-02-27 DIAGNOSIS — R296 Repeated falls: Secondary | ICD-10-CM | POA: Diagnosis not present

## 2021-02-27 DIAGNOSIS — R5381 Other malaise: Secondary | ICD-10-CM | POA: Diagnosis not present

## 2021-02-27 DIAGNOSIS — R413 Other amnesia: Secondary | ICD-10-CM

## 2021-02-27 DIAGNOSIS — N760 Acute vaginitis: Secondary | ICD-10-CM | POA: Diagnosis not present

## 2021-02-27 DIAGNOSIS — N3000 Acute cystitis without hematuria: Secondary | ICD-10-CM | POA: Insufficient documentation

## 2021-02-27 DIAGNOSIS — G629 Polyneuropathy, unspecified: Secondary | ICD-10-CM | POA: Diagnosis not present

## 2021-02-27 DIAGNOSIS — F449 Dissociative and conversion disorder, unspecified: Secondary | ICD-10-CM

## 2021-02-27 DIAGNOSIS — Z748 Other problems related to care provider dependency: Secondary | ICD-10-CM | POA: Diagnosis not present

## 2021-02-27 DIAGNOSIS — F32A Depression, unspecified: Secondary | ICD-10-CM

## 2021-02-27 DIAGNOSIS — M810 Age-related osteoporosis without current pathological fracture: Secondary | ICD-10-CM | POA: Diagnosis not present

## 2021-02-27 DIAGNOSIS — F3160 Bipolar disorder, current episode mixed, unspecified: Secondary | ICD-10-CM

## 2021-02-27 DIAGNOSIS — Z1231 Encounter for screening mammogram for malignant neoplasm of breast: Secondary | ICD-10-CM

## 2021-02-27 DIAGNOSIS — F419 Anxiety disorder, unspecified: Secondary | ICD-10-CM

## 2021-02-27 HISTORY — DX: Other malaise: R53.81

## 2021-02-27 MED ORDER — FLUCONAZOLE 150 MG PO TABS: 150.0000 mg | ORAL_TABLET | Freq: Once | ORAL | 0 refills | Status: DC

## 2021-02-27 NOTE — Progress Notes (Signed)
Chief Complaint  Patient presents with   Follow-up   F/u with grandson Richie  1. Frequent falls today In manual wheelchair due to legs weakness and 5/10 right leg pain established with ortho she was having left left pain she has falls but no LOC doing home PT/OT each 1x per week at home lives with grandson and his wife and their child  -will message Dr Joretta Bachelor as pt just had low back surgery lumbar fusion and 12/11/20 s/p laminectomy and decompression  went to rehab [facility] and was able to walk now unable ct scan in ed 02/10/21 negative stroke  She is also having numbness/tingling to the ankle and feels like a band around her ankle neurology appt Dr. Manuella Ghazi upcoming as well with h/o neuropathy  She uses otc pain patches   2. Osteoporosis reclast injection upcoming  3. Yeast infection concern tried otc monistat and prev had uti   Review of Systems  Constitutional:  Negative for weight loss.  HENT:  Negative for hearing loss.   Eyes:  Negative for blurred vision.  Respiratory:  Negative for cough and shortness of breath.   Cardiovascular:  Negative for chest pain.  Gastrointestinal:  Negative for abdominal pain and blood in stool.  Genitourinary:  Negative for dysuria.  Musculoskeletal:  Negative for falls and joint pain.  Skin:  Negative for rash.  Neurological:  Positive for tingling, sensory change and weakness. Negative for loss of consciousness and headaches.  Psychiatric/Behavioral:  Negative for depression.   Past Medical History:  Diagnosis Date   Anxiety    Aortic stenosis 07/09/2015   a.) TTE 07/06/2015: EF 55-60%; mild AS with MPG 13.0 mmHg.   Asthma    Bipolar disorder ([place])    C. difficile diarrhea 2010   a.) following ABX course during hospital admission   Carotid atherosclerosis, bilateral    a.) Moderate; < 50% stenosis BILATERAL ICAs.   Cataract    a.) s/p BILATERAL extraction   Chicken pox    CKD (chronic kidney disease), stage III (HCC)    a. s/p R  nephrectomy./ aneurysm   Conversion disorder    COPD (chronic obstructive pulmonary disease) (HCC)    Depression    Essential hypertension    GERD (gastroesophageal reflux disease)    Heart murmur    Hyperlipidemia    ILD (interstitial lung disease) (HCC)    mild; 2/2 RA diagnosis   Inflammatory arthritis    a. hands/carpal tunnel.  b. Low titer rheumatoid factor. c. Negative anti-CCP antibodies. d. Plaquenil.   Macular degeneration    Nocturnal hypoxemia    Non-Obstructive CAD    a. 07/2009 Cath ([facility]): nonobs dzs;  b. 03/2011 Cath [facility]): nonobs dzs.   Osteoarthritis    a. Knees.   PAD (peripheral artery disease) (HCC)    PUD (peptic ulcer disease)    S/P right hip fracture    11/01/16 s/p repair   Shoulder pain    Sleep apnea    no cpap / minimal   Spinal stenosis at L4-L5 level    severe with L4/L5 anterolisthesis grade 1 anterolisthesis    Toxic maculopathy    Valvular heart disease    a.) TTE 07/06/2015: EF 55-60%; Mild MR/AR/TR; Mild AS with MPG 13.0 mmHg.   Past Surgical History:  Procedure Laterality Date   APPENDECTOMY     BACK SURGERY     lumbar fusion   BUNIONECTOMY Right    CATARACT EXTRACTION, BILATERAL     CESAREAN SECTION  x1   CHOLECYSTECTOMY N/A 05/11/2016   Procedure: LAPAROSCOPIC CHOLECYSTECTOMY;  Surgeon: Florene Glen, MD;  Location: [facility] ORS;  Service: General;  Laterality: N/A;   COLONOSCOPY WITH PROPOFOL N/A 04/02/2016   Procedure: COLONOSCOPY WITH PROPOFOL;  Surgeon: Jonathon Bellows, MD;  Location: [facility] ENDOSCOPY;  Service: Endoscopy;  Laterality: N/A;   ENDOSCOPIC RETROGRADE CHOLANGIOPANCREATOGRAPHY (ERCP) WITH PROPOFOL N/A 05/08/2016   Procedure: ENDOSCOPIC RETROGRADE CHOLANGIOPANCREATOGRAPHY (ERCP) WITH PROPOFOL;  Surgeon: Lucilla Lame, MD;  Location: [facility] ENDOSCOPY;  Service: Endoscopy;  Laterality: N/A;   ERCP     with biliary spincterotomy 05/08/16 Dr. Allen Norris for choledocholithiasis    ESOPHAGEAL DILATION  04/02/2016   Procedure: ESOPHAGEAL DILATION;   Surgeon: Jonathon Bellows, MD;  Location: [facility];  Service: Endoscopy;;   ESOPHAGOGASTRODUODENOSCOPY (EGD) WITH PROPOFOL N/A 04/02/2016   Procedure: ESOPHAGOGASTRODUODENOSCOPY (EGD) WITH PROPOFOL;  Surgeon: Jonathon Bellows, MD;  Location: [facility] END[facility];  Service: Endoscopy;  Laterality: N/A;   HIP ARTHROPLASTY Right 11/01/2016   Procedure: ARTHROPLASTY BIPOLAR HIP (HEMIARTHROPLASTY);  Surgeon: Corky Mull, MD;  Location: [facility] ORS;  Service: Orthopedics;  Laterality: Right;   LUMBAR LAMINECTOMY/DECOMPRESSION MICRODISCECTOMY N/A 12/11/2020   Procedure: LEFT L2-3 MICRODISCECTOMY, L3-4 AND L5-S1 DECOMPRESSION;  Surgeon: Meade Maw, MD;  Location: [facility] OR[facility];  Service: Neurosurgery;  Laterality: N/A;   NEPHRECTOMY  1988   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   ORIF SCAPHOID FRACTURE Left 12/21/2019   Procedure: OPEN REDUCTION INTERNAL FIXATION (ORIF) OF LEFT SCAPULAR NONUNION WITH BONE GRAFT;  Surgeon: Corky Mull, MD;  Location: [facility];  Service: Orthopedics;  Laterality: Left;   osteoporosis     noted DEXA 08/19/16    REPLACEMENT TOTAL KNEE Right    REVERSE SHOULDER ARTHROPLASTY Right 11/04/2017   Procedure: REVERSE SHOULDER ARTHROPLASTY;  Surgeon: Corky Mull, MD;  Location: [facility];  Service: Orthopedics;  Laterality: Right;   REVERSE SHOULDER ARTHROPLASTY Left 07/26/2018   Procedure: REVERSE SHOULDER ARTHROPLASTY;  Surgeon: Corky Mull, MD;  Location: [facility];  Service: Orthopedics;  Laterality: Left;   TONSILLECTOMY     TOTAL HIP ARTHROPLASTY  12/10/11   [facility] left hip   TOTAL HIP ARTHROPLASTY Bilateral    TUBAL LIGATION     Family History  Problem Relation Age of Onset   Rheum arthritis Mother    Asthma Mother    Parkinson's disease Mother    Heart disease Mother    Stroke Mother    Hypertension Mother    Heart attack Father    Heart disease Father    Hypertension Father    Peripheral Artery Disease Father    Diabetes Son    Gout Son     Asthma Sister    Heart disease Sister    Lung cancer Sister    Heart disease Sister    Heart disease Sister    Breast cancer Sister    Heart attack Sister    Heart disease Brother    Heart disease Maternal Grandmother    Diabetes Maternal Grandmother    Colon cancer Maternal Grandmother    Cancer Maternal Grandmother        Hodgkins lymphoma   Heart disease Brother    Alcohol abuse Brother    Depression Brother    Dementia Son    Social History   Socioeconomic History   Marital status: Widowed    Spouse name: richard   Number of children: 2   Years of education: Some Coll   Highest education level: Some  college, no degree  Occupational History   Occupation: Retired    Comment: retired  Tobacco Use   Smoking status: Former    Packs/day: 0.50    Years: 20.00    Pack years: 10.00    Types: Cigarettes    Quit date: 02/02/1974    Years since quitting: 47.1   Smokeless tobacco: Never  Vaping Use   Vaping Use: Never used  Substance and Sexual Activity   Alcohol use: No   Drug use: No   Sexual activity: Not Currently  Other Topics Concern   Not on file  Social History Narrative   Lives at home in [place] grandson, his wife and their child.   Husband Naama Sappington died covid 55 late 1/2054married x 26 years   Right-handed.   6 cups coffee per day.   Social Determinants of Health   Financial Resource Strain: Not on file  Food Insecurity: Not on file  Transportation Needs: Not on file  Physical Activity: Not on file  Stress: Not on file  Social Connections: Not on file  Intimate Partner Violence: Not on file   Current Meds  Medication Sig   Adalimumab 40 MG/0.4ML PNKT Inject 40 mg into the skin every 14 (fourteen) days.   albuterol (VENTOLIN HFA) 108 (90 Base) MCG/ACT inhaler Inhale 2 puffs into the lungs every 6 (six) hours as needed for wheezing or shortness of breath.   ALPRAZolam (XANAX) 0.25 MG tablet TAKE 1 TABLET BY MOUTH AT BEDTIME AS NEEDED FOR  ANXIETY   amLODipine (NORVASC) 2.5 MG tablet Take 1 tablet (2.5 mg total) by mouth daily.   benzonatate (TESSALON) 200 MG capsule Take 1 capsule (200 mg total) by mouth 3 (three) times daily as needed for cough.   budesonide (PULMICORT) 0.25 MG/2ML nebulizer solution USE 1 VIAL  IN  NEBULIZER TWICE  DAILY - rinse mouth after treatment   Cholecalciferol (VITAMIN D3) 50 MCG (2000 UT) TABS Take 4,000 Units by mouth daily.   cyclobenzaprine (FLEXERIL) 10 MG tablet Take 1 tablet (10 mg total) by mouth 3 (three) times daily as needed for muscle spasms.   dicyclomine (BENTYL) 10 MG capsule Take 1 capsule (10 mg total) by mouth 2 (two) times daily as needed for spasms. (Patient taking differently: Take 20 mg by mouth 2 (two) times daily.)   escitalopram (LEXAPRO) 10 MG tablet TAKE 1 TABLET BY MOUTH EVERY DAY   etodolac (LODINE) 500 MG tablet Take 500 mg by mouth daily.   famotidine (PEPCID) 20 MG tablet TAKE 1 TABLET (20 MG TOTAL) BY MOUTH DAILY. BEFORE BREAKFAST OR DINNER   fluconazole (DIFLUCAN) 150 MG tablet Take 1 tablet (150 mg total) by mouth once for 1 dose. Repeat 2nd dose in 3 days prn   fluticasone (FLONASE) 50 MCG/ACT nasal spray Place 2 sprays into both nostrils daily. In am   gabapentin (NEURONTIN) 300 MG capsule Take 2 capsules (600 mg total) by mouth in the morning and at bedtime.   ipratropium-albuterol (DUONEB) 0.5-2.5 (3) MG/3ML SOLN TAKE 3 MLS BY NEBULIZATION 3 (THREE) TIMES DAILY AS NEEDED. (Patient taking differently: Take 3 mLs by nebulization 2 (two) times daily.)   lamoTRIgine (LAMICTAL) 100 MG tablet TAKE 1 TAB 2 TIMES DAILY. FURTHER REFILLS NEW PSYCH FOR ALL PSYCH MEDS ONLY TEMP SUPPLY FROM PCP (Patient taking differently: Take 100 mg by mouth 2 (two) times daily. TAKE 1 TAB 2 TIMES DAILY. FURTHER REFILLS NEW PSYCH FOR ALL PSYCH MEDS ONLY TEMP SUPPLY FROM PCP)   leflunomide (  ARAVA) 20 MG tablet Take 1 tablet (20 mg total) by mouth daily.   lidocaine (LIDODERM) 5 % Place 1 patch onto  the skin 2 (two) times daily as needed. Remove & Discard patch within 12 hours or as directed by MD (Patient taking differently: Place 1 patch onto the skin daily. Remove & Discard patch within 12 hours or as directed by MD)   lovastatin (MEVACOR) 20 MG tablet TAKE 1 TABLET BY MOUTH EVERYDAY AT BEDTIME *STOP TALKING 40MG * (Patient taking differently: Take 20 mg by mouth at bedtime.)   methocarbamol (ROBAXIN) 500 MG tablet Take 500 mg by mouth 4 (four) times daily as needed.   mirabegron ER (MYRBETRIQ) 50 MG TB24 tablet Take 1 tablet (50 mg total) by mouth daily.   montelukast (SINGULAIR) 10 MG tablet TAKE 1 TABLET BY MOUTH EVERY DAY   multivitamin-lutein (OCUVITE-LUTEIN) CAPS capsule Take 1 capsule by mouth daily.   nebivolol (BYSTOLIC) 10 MG tablet Take 0.5 tablets (5 mg total) by mouth in the morning and at bedtime.   QUEtiapine (SEROQUEL) 25 MG tablet TAKE 1 TABLET (25 MG TOTAL) BY MOUTH AT BEDTIME. AGAIN LAST FILL FURTHER REFILLS FROM PSYCHIATRY NO EXCEPTIONS   sucralfate (CARAFATE) 1 g tablet Take 1 tablet (1 g total) by mouth 2 (two) times daily.   zoledronic acid (RECLAST) 5 MG/100ML SOLN injection Inject 5 mg into the vein once.   Allergies  Allergen Reactions   Ceftin [Cefuroxime Axetil] Anaphylaxis   Lisinopril Anaphylaxis   Sulfa Antibiotics Other (See Comments)    Face swelling   Sulfasalazine Anaphylaxis   Morphine Other (See Comments)    Per patient, low blood pressure issues that requires action to raise it back up. Can take small infrequent doses   Xarelto [Rivaroxaban] Other (See Comments)    Stomach burning, bleeding, and tar in stool   Adhesive [Tape] Rash    Paper tape and tega derm OK   Antihistamines, Chlorpheniramine-Type Other (See Comments)    Makes pt hyper   Antivert [Meclizine Hcl] Other (See Comments)    Bladder will not empty   Aspirin Other (See Comments)    Sulfasalazine allergy cross reacts   Contrast Media [Iodinated Contrast Media] Rash    she is  able to use betadine scrubs.   Decongestant [Pseudoephedrine Hcl] Other (See Comments)    Makes pt hyper   Doxycycline Other (See Comments)    GI upset   Levaquin [Levofloxacin In D5w] Rash   Polymyxin B Other (See Comments)    Facial rash   Tetanus Toxoids Rash and Other (See Comments)    Fever and hot to touch at injection site   Recent Results (from the past 2160 hour(s))  Bladder Scan (Post Void Residual) in office     Status: None   Collection Time: 12/04/20  2:23 PM  Result Value Ref Range   Scan Result 76mL   APTT     Status: None   Collection Time: 12/10/20  9:45 AM  Result Value Ref Range   aPTT 26 24 - 36 seconds    Comment: Performed at [facility], [address] [place], [place] [address]  Protime-INR     Status: None   Collection Time: 12/10/20  9:45 AM  Result Value Ref Range   Prothrombin Time 12.6 11.4 - 15.2 seconds   INR 0.9 0.8 - 1.2    Comment: (NOTE) INR goal varies based on device and disease states. Performed at [facility], [address],  [place], [place] [address]   Surgical pcr screen     Status: None   Collection Time: 12/10/20  9:45 AM   Specimen: Nasal Mucosa; Nasal Swab  Result Value Ref Range   MRSA, PCR NEGATIVE NEGATIVE   Staphylococcus aureus NEGATIVE NEGATIVE    Comment: (NOTE) The Xpert SA Assay (FDA approved for NASAL specimens in patients 41 years of age and older), is one component of a comprehensive surveillance program. It is not intended to diagnose infection nor to guide or monitor treatment. Performed at [facility], [address] [place], [place] [address]   Type and screen     Status: None   Collection Time: 12/10/20  9:45 AM  Result Value Ref Range   ABO/RH(D) AB POS    Antibody Screen NEG    Sample Expiration 12/24/2020,2359    Extend sample reason      NO TRANSFUSIONS OR PREGNANCY IN THE PAST 3 MONTHS Performed at [facility], [address] [place], [place]  [address]   SARS CORONAVIRUS 2 (TAT 6-24 HRS) Nasopharyngeal Nasopharyngeal Swab     Status: None   Collection Time: 12/10/20 10:47 AM   Specimen: Nasopharyngeal Swab  Result Value Ref Range   SARS Coronavirus 2 NEGATIVE NEGATIVE    Comment: (NOTE) SARS-CoV-2 target nucleic acids are NOT DETECTED.  The SARS-CoV-2 RNA is generally detectable in upper and lower respiratory specimens during the acute phase of infection. Negative results do not preclude SARS-CoV-2 infection, do not rule out co-infections with other pathogens, and should not be used as the sole basis for treatment or other patient management decisions. Negative results must be combined with clinical observations, patient history, and epidemiological information. The expected result is Negative.  Fact Sheet for Patients: SugarRoll.be  Fact Sheet for Healthcare Providers: https://www.woods-mathews.com/  This test is not yet approved or cleared by the [place] FDA and  has been authorized for detection and/or diagnosis of SARS-CoV-2 by FDA under an Emergency Use Authorization (EUA). This EUA will remain  in effect (meaning this test can be used) for the duration of the COVID-19 declaration under Se ction 564(b)(1) of the Act, 21 U.S.C. section 360bbb-3(b)(1), unless the authorization is terminated or revoked sooner.  Performed at [facility], [place] [address], [place], [place] [address]   Urinalysis, Routine w reflex microscopic Urine, Unspecified Source     Status: Abnormal   Collection Time: 12/11/20  8:25 AM  Result Value Ref Range   Color, Urine YELLOW (A) YELLOW   APPearance HAZY (A) CLEAR   Specific Gravity, Urine 1.012 1.005 - 1.030   pH 6.0 5.0 - 8.0   Glucose, UA NEGATIVE NEGATIVE mg/dL   Hgb urine dipstick NEGATIVE NEGATIVE   Bilirubin Urine NEGATIVE NEGATIVE   Ketones, ur NEGATIVE NEGATIVE mg/dL   Protein, ur NEGATIVE NEGATIVE mg/dL   Nitrite NEGATIVE  NEGATIVE   Leukocytes,Ua NEGATIVE NEGATIVE    Comment: Performed at [facility], [address] [place], [place] [address]  CBC     Status: Abnormal   Collection Time: 01/12/21  1:27 PM  Result Value Ref Range   WBC 15.9 (H) 4.0 - 10.5 K/uL   RBC 4.72 3.87 - 5.11 MIL/uL   Hemoglobin 13.3 12.0 - 15.0 g/dL   HCT 42.1 36.0 - 46.0 %   MCV 89.2 80.0 - 100.0 fL   MCH 28.2 26.0 - 34.0 pg   MCHC 31.6 30.0 - 36.0 g/dL   RDW 15.0 11.5 - 15.5 %  Platelets 273 150 - 400 K/uL   nRBC 0.0 0.0 - 0.2 %    Comment: Performed at [facility], [address] [place], [place] [address]  Basic metabolic panel     Status: Abnormal   Collection Time: 01/12/21  1:27 PM  Result Value Ref Range   Sodium 131 (L) 135 - 145 mmol/L   Potassium 3.7 3.5 - 5.1 mmol/L   Chloride 96 (L) 98 - 111 mmol/L   CO2 26 22 - 32 mmol/L   Glucose, Bld 178 (H) 70 - 99 mg/dL    Comment: Glucose reference range applies only to samples taken after fasting for at least 8 hours.   BUN 18 8 - 23 mg/dL   Creatinine, Ser 1.36 (H) 0.44 - 1.00 mg/dL   Calcium 8.9 8.9 - 10.3 mg/dL   GFR, Estimated 40 (L) >60 mL/min    Comment: (NOTE) Calculated using the CKD-EPI Creatinine Equation (2021)    Anion gap 9 5 - 15    Comment: Performed at [facility], [address], [place], [place] [address]  Resp Panel by RT-PCR (Flu A&B, Covid) Nasopharyngeal Swab     Status: None   Collection Time: 01/12/21  7:40 PM   Specimen: Nasopharyngeal Swab; Nasopharyngeal(NP) swabs in vial transport medium  Result Value Ref Range   SARS Coronavirus 2 by RT PCR NEGATIVE NEGATIVE    Comment: (NOTE) SARS-CoV-2 target nucleic acids are NOT DETECTED.  The SARS-CoV-2 RNA is generally detectable in upper respiratory specimens during the acute phase of infection. The lowest concentration of SARS-CoV-2 viral copies this assay can detect is 138 copies/mL. A negative result does not preclude SARS-Cov-2 infection and should not  be used as the sole basis for treatment or other patient management decisions. A negative result may occur with  improper specimen collection/handling, submission of specimen other than nasopharyngeal swab, presence of viral mutation(s) within the areas targeted by this assay, and inadequate number of viral copies(<138 copies/mL). A negative result must be combined with clinical observations, patient history, and epidemiological information. The expected result is Negative.  Fact Sheet for Patients:  EntrepreneurPulse.com.au  Fact Sheet for Healthcare Providers:  IncredibleEmployment.be  This test is no t yet approved or cleared by the [place] FDA and  has been authorized for detection and/or diagnosis of SARS-CoV-2 by FDA under an Emergency Use Authorization (EUA). This EUA will remain  in effect (meaning this test can be used) for the duration of the COVID-19 declaration under Section 564(b)(1) of the Act, 21 U.S.C.section 360bbb-3(b)(1), unless the authorization is terminated  or revoked sooner.       Influenza A by PCR NEGATIVE NEGATIVE   Influenza B by PCR NEGATIVE NEGATIVE    Comment: (NOTE) The Xpert Xpress SARS-CoV-2/FLU/RSV plus assay is intended as an aid in the diagnosis of influenza from Nasopharyngeal swab specimens and should not be used as a sole basis for treatment. Nasal washings and aspirates are unacceptable for Xpert Xpress SARS-CoV-2/FLU/RSV testing.  Fact Sheet for Patients: EntrepreneurPulse.com.au  Fact Sheet for Healthcare Providers: IncredibleEmployment.be  This test is not yet approved or cleared by the [place] FDA and has been authorized for detection and/or diagnosis of SARS-CoV-2 by FDA under an Emergency Use Authorization (EUA). This EUA will remain in effect (meaning this test can be used) for the duration of the COVID-19 declaration under Section 564(b)(1) of  the Act, 21 U.S.C. section 360bbb-3(b)(1), unless the authorization is terminated or revoked.  Performed at [facility], [address], [place], [place] [address]   Expectorated Sputum Assessment w Gram Stain, Rflx to Resp Cult     Status: None   Collection Time: 01/12/21 10:50 PM   Specimen: Expectorated Sputum  Result Value Ref Range   Specimen Description EXPECTORATED SPUTUM    Special Requests NONE    Sputum evaluation      THIS SPECIMEN IS ACCEPTABLE FOR SPUTUM CULTURE Performed at [facility], [address] [place], [place] [address]    Report Status 01/13/2021 FINAL   CBC     Status: Abnormal   Collection Time: 01/12/21 10:50 PM  Result Value Ref Range   WBC 14.1 (H) 4.0 - 10.5 K/uL   RBC 4.35 3.87 - 5.11 MIL/uL   Hemoglobin 12.3 12.0 - 15.0 g/dL   HCT 38.6 36.0 - 46.0 %   MCV 88.7 80.0 - 100.0 fL   MCH 28.3 26.0 - 34.0 pg   MCHC 31.9 30.0 - 36.0 g/dL   RDW 14.9 11.5 - 15.5 %   Platelets 267 150 - 400 K/uL   nRBC 0.0 0.0 - 0.2 %    Comment: Performed at [facility], [address] [place], [place] [address]  Creatinine, serum     Status: Abnormal   Collection Time: 01/12/21 10:50 PM  Result Value Ref Range   Creatinine, Ser 1.29 (H) 0.44 - 1.00 mg/dL   GFR, Estimated 42 (L) >60 mL/min    Comment: (NOTE) Calculated using the CKD-EPI Creatinine Equation (2021) Performed at [facility], [address] [place], [place] [address]   D-dimer, quantitative     Status: Abnormal   Collection Time: 01/12/21 10:50 PM  Result Value Ref Range   D-Dimer, Quant 1.40 (H) 0.00 - 0.50 ug/mL-FEU    Comment: (NOTE) At the manufacturer cut-off value of 0.5 g/mL FEU, this assay has a negative predictive value of 95-100%.This assay is intended for use in conjunction with a clinical pretest probability (PTP) assessment model to exclude pulmonary embolism (PE) and deep venous thrombosis (DVT) in outpatients suspected of PE or  DVT. Results should be correlated with clinical presentation. Performed at [facility], [address] [place], [place] [address]   Culture, Respiratory w Gram Stain     Status: None   Collection Time: 01/12/21 10:50 PM  Result Value Ref Range   Specimen Description      EXPECTORATED SPUTUM Performed at [facility], [address] [place], [place] [address]    Special Requests      NONE Reflexed from [id number] Performed at [facility], [place], [place] [address]    Gram Stain      RARE WBC PRESENT, PREDOMINANTLY MONONUCLEAR FEW GRAM POSITIVE COCCI    Culture      ABUNDANT Consistent with normal respiratory flora. Performed at [facility], [place] [address] [place], [place] [address]    Report Status 01/15/2021 FINAL   Blood gas, venous     Status: Abnormal   Collection Time: 01/12/21 11:03 PM  Result Value Ref Range   pH, Ven 7.46 (H) 7.250 - 7.430   pCO2, Ven 43 (L) 44.0 - 60.0 mmHg   pO2, Ven 54.0 (H) 32.0 - 45.0 mmHg   Bicarbonate 30.6 (H) 20.0 - 28.0 mmol/L   Acid-Base Excess 6.1 (H) 0.0 - 2.0 mmol/L   O2 Saturation 89.5 %   Patient temperature 37.0    Collection site VEIN    Sample type  VENOUS     Comment: Performed at [facility], [address] [place], [place] [address]  Basic metabolic panel     Status: Abnormal   Collection Time: 01/13/21  6:28 AM  Result Value Ref Range   Sodium 133 (L) 135 - 145 mmol/L   Potassium 4.1 3.5 - 5.1 mmol/L   Chloride 98 98 - 111 mmol/L   CO2 27 22 - 32 mmol/L   Glucose, Bld 257 (H) 70 - 99 mg/dL    Comment: Glucose reference range applies only to samples taken after fasting for at least 8 hours.   BUN 18 8 - 23 mg/dL   Creatinine, Ser 1.27 (H) 0.44 - 1.00 mg/dL   Calcium 8.5 (L) 8.9 - 10.3 mg/dL   GFR, Estimated 43 (L) >60 mL/min    Comment: (NOTE) Calculated using the CKD-EPI Creatinine Equation (2021)    Anion gap 8 5 - 15    Comment: Performed at [facility], [address] [place], [place] [address]  CBC     Status: Abnormal   Collection Time: 01/13/21  6:28 AM  Result Value Ref Range   WBC 11.2 (H) 4.0 - 10.5 K/uL   RBC 4.17 3.87 - 5.11 MIL/uL   Hemoglobin 11.5 (L) 12.0 - 15.0 g/dL   HCT 36.9 36.0 - 46.0 %   MCV 88.5 80.0 - 100.0 fL   MCH 27.6 26.0 - 34.0 pg   MCHC 31.2 30.0 - 36.0 g/dL   RDW 15.0 11.5 - 15.5 %   Platelets 242 150 - 400 K/uL   nRBC 0.0 0.0 - 0.2 %    Comment: Performed at [facility], [address] [place], [place] [address]  Comprehensive metabolic panel     Status: Abnormal   Collection Time: 02/10/21 11:17 AM  Result Value Ref Range   Sodium 141 135 - 145 mmol/L   Potassium 4.0 3.5 - 5.1 mmol/L   Chloride 108 98 - 111 mmol/L   CO2 23 22 - 32 mmol/L   Glucose, Bld 120 (H) 70 - 99 mg/dL    Comment: Glucose reference range applies only to samples taken after fasting for at least 8 hours.   BUN 19 8 - 23 mg/dL   Creatinine, Ser 1.13 (H) 0.44 - 1.00 mg/dL   Calcium 8.4 (L) 8.9 - 10.3 mg/dL   Total Protein 6.3 (L) 6.5 - 8.1 g/dL   Albumin 3.3 (L) 3.5 - 5.0 g/dL   AST 22 15 - 41 U/L   ALT 16 0 - 44 U/L   Alkaline Phosphatase 90 38 - 126 U/L   Total Bilirubin 0.6 0.3 - 1.2 mg/dL   GFR, Estimated 50 (L) >60 mL/min    Comment: (NOTE) Calculated using the CKD-EPI Creatinine Equation (2021)    Anion gap 10 5 - 15    Comment: Performed at [facility], [address] [place], [place] [address]  CBC     Status: Abnormal   Collection Time: 02/10/21 11:17 AM  Result Value Ref Range   WBC 11.5 (H) 4.0 - 10.5 K/uL   RBC 4.38 3.87 - 5.11 MIL/uL   Hemoglobin 12.3 12.0 - 15.0 g/dL   HCT 40.6 36.0 - 46.0 %   MCV 92.7 80.0 - 100.0 fL   MCH 28.1 26.0 - 34.0 pg   MCHC 30.3 30.0 - 36.0 g/dL   RDW 15.1 11.5 - 15.5 %   Platelets 212 150 - 400 K/uL   nRBC 0.0  0.0 - 0.2 %    Comment: Performed at [facility], [place], [place] [address]  Troponin I (High  Sensitivity)     Status: None   Collection Time: 02/10/21 11:19 AM  Result Value Ref Range   Troponin I (High Sensitivity) 17 <18 ng/L    Comment: (NOTE) Elevated high sensitivity troponin I (hsTnI) values and significant  changes across serial measurements may suggest ACS but many other  chronic and acute conditions are known to elevate hsTnI results.  Refer to the "Links" section for chest pain algorithms and additional  guidance. Performed at [facility], [address] [place], [place] [address]   Blood gas, venous     Status: Abnormal   Collection Time: 02/10/21  6:33 PM  Result Value Ref Range   pH, Ven 7.41 7.250 - 7.430   pCO2, Ven 44 44.0 - 60.0 mmHg   pO2, Ven 36.0 32.0 - 45.0 mmHg   Bicarbonate 27.9 20.0 - 28.0 mmol/L   Acid-Base Excess 2.8 (H) 0.0 - 2.0 mmol/L   O2 Saturation 69.7 %   Patient temperature 37.0    Collection site VEIN    Sample type VENOUS     Comment: Performed at [facility], [address], [place], [place] [address]  Resp Panel by RT-PCR (Flu A&B, Covid) Nasopharyngeal Swab     Status: None   Collection Time: 02/10/21  7:15 PM   Specimen: Nasopharyngeal Swab; Nasopharyngeal(NP) swabs in vial transport medium  Result Value Ref Range   SARS Coronavirus 2 by RT PCR NEGATIVE NEGATIVE    Comment: (NOTE) SARS-CoV-2 target nucleic acids are NOT DETECTED.  The SARS-CoV-2 RNA is generally detectable in upper respiratory specimens during the acute phase of infection. The lowest concentration of SARS-CoV-2 viral copies this assay can detect is 138 copies/mL. A negative result does not preclude SARS-Cov-2 infection and should not be used as the sole basis for treatment or other patient management decisions. A negative result may occur with  improper specimen collection/handling, submission of specimen other than nasopharyngeal swab, presence of viral mutation(s) within the areas targeted by this assay, and inadequate number of  viral copies(<138 copies/mL). A negative result must be combined with clinical observations, patient history, and epidemiological information. The expected result is Negative.  Fact Sheet for Patients:  EntrepreneurPulse.com.au  Fact Sheet for Healthcare Providers:  IncredibleEmployment.be  This test is no t yet approved or cleared by the [place] FDA and  has been authorized for detection and/or diagnosis of SARS-CoV-2 by FDA under an Emergency Use Authorization (EUA). This EUA will remain  in effect (meaning this test can be used) for the duration of the COVID-19 declaration under Section 564(b)(1) of the Act, 21 U.S.C.section 360bbb-3(b)(1), unless the authorization is terminated  or revoked sooner.       Influenza A by PCR NEGATIVE NEGATIVE   Influenza B by PCR NEGATIVE NEGATIVE    Comment: (NOTE) The Xpert Xpress SARS-CoV-2/FLU/RSV plus assay is intended as an aid in the diagnosis of influenza from Nasopharyngeal swab specimens and should not be used as a sole basis for treatment. Nasal washings and aspirates are unacceptable for Xpert Xpress SARS-CoV-2/FLU/RSV testing.  Fact Sheet for Patients: EntrepreneurPulse.com.au  Fact Sheet for Healthcare Providers: IncredibleEmployment.be  This test is not yet approved or cleared by the [place] FDA and has been authorized for detection and/or diagnosis of SARS-CoV-2 by FDA under an Emergency Use Authorization (EUA). This EUA will remain in effect (  meaning this test can be used) for the duration of the COVID-19 declaration under Section 564(b)(1) of the Act, 21 U.S.C. section 360bbb-3(b)(1), unless the authorization is terminated or revoked.  Performed at [facility], [address] [place], [place] [address]   Urinalysis, Complete w Microscopic Urine, Catheterized     Status: Abnormal   Collection Time: 02/10/21  8:50 PM  Result  Value Ref Range   Color, Urine YELLOW (A) YELLOW   APPearance CLEAR (A) CLEAR   Specific Gravity, Urine 1.019 1.005 - 1.030   pH 5.0 5.0 - 8.0   Glucose, UA NEGATIVE NEGATIVE mg/dL   Hgb urine dipstick SMALL (A) NEGATIVE   Bilirubin Urine NEGATIVE NEGATIVE   Ketones, ur NEGATIVE NEGATIVE mg/dL   Protein, ur NEGATIVE NEGATIVE mg/dL   Nitrite POSITIVE (A) NEGATIVE   Leukocytes,Ua NEGATIVE NEGATIVE   WBC, UA 0-5 0 - 5 WBC/hpf   Bacteria, UA RARE (A) NONE SEEN   Squamous Epithelial / LPF 0-5 0 - 5   Mucus PRESENT     Comment: Performed at [facility], [address] [place], [place] [address]  Urine Culture     Status: Abnormal   Collection Time: 02/10/21  8:50 PM   Specimen: Urine, Random  Result Value Ref Range   Specimen Description      URINE, RANDOM Performed at [facility], [address] [place], [place] [address]    Special Requests      NONE Performed at [facility], [address] [place], [place] [address]    Culture >=100,000 COLONIES/mL ESCHERICHIA COLI (A)    Report Status 02/12/2021 FINAL    Organism ID, Bacteria ESCHERICHIA COLI (A)       Susceptibility   Escherichia coli - MIC*    AMPICILLIN <=2 SENSITIVE Sensitive     CEFAZOLIN <=4 SENSITIVE Sensitive     CEFEPIME <=0.12 SENSITIVE Sensitive     CEFTRIAXONE <=0.25 SENSITIVE Sensitive     CIPROFLOXACIN <=0.25 SENSITIVE Sensitive     GENTAMICIN <=1 SENSITIVE Sensitive     IMIPENEM <=0.25 SENSITIVE Sensitive     NITROFURANTOIN <=16 SENSITIVE Sensitive     TRIMETH/SULFA <=20 SENSITIVE Sensitive     AMPICILLIN/SULBACTAM <=2 SENSITIVE Sensitive     PIP/TAZO <=4 SENSITIVE Sensitive     * >=100,000 COLONIES/mL ESCHERICHIA COLI   Objective  Body mass index is 32.57 kg/m. Wt Readings from Last 3 Encounters:  02/27/21 172 lb 6.4 oz (78.2 kg)  01/12/21 180 lb (81.6 kg)  12/11/20 173 lb 8 oz (78.7 kg)   Temp Readings from Last 3 Encounters:  02/27/21 98.1 F (36.7 C) (Oral)   02/11/21 98 F (36.7 C) (Oral)  01/12/21 98.4 F (36.9 C) (Oral)   BP Readings from Last 3 Encounters:  02/27/21 138/80  02/11/21 (!) 167/69  01/13/21 135/70   Pulse Readings from Last 3 Encounters:  02/27/21 80  02/11/21 68  01/13/21 97    Physical Exam Vitals and nursing note reviewed.  Constitutional:      Appearance: Normal appearance. She is well-developed and well-groomed.  HENT:     Head: Normocephalic and atraumatic.  Eyes:     Conjunctiva/sclera: Conjunctivae normal.     Pupils: Pupils are equal, round, and reactive to light.  Cardiovascular:     Rate and Rhythm: Normal rate and regular rhythm.     Heart sounds: Normal heart sounds. No murmur heard. Pulmonary:     Effort: Pulmonary effort is normal.  Breath sounds: Normal breath sounds.  Abdominal:     General: Abdomen is flat. Bowel sounds are normal.     Tenderness: There is no abdominal tenderness.  Musculoskeletal:        General: No tenderness.  Skin:    General: Skin is warm and dry.  Neurological:     General: No focal deficit present.     Mental Status: She is alert and oriented to person, place, and time. Mental status is at baseline.     Cranial Nerves: Cranial nerves 2-12 are intact.     Motor: Weakness present.     Gait: Gait abnormal.     Comments: In wheelchair today  3+/5 strength b/l lower legs  Arms strength 4+/5/5   Psychiatric:        Attention and Perception: Attention and perception normal.        Mood and Affect: Mood and affect normal.        Speech: Speech normal.        Behavior: Behavior normal. Behavior is cooperative.        Thought Content: Thought content normal.        Cognition and Memory: Cognition and memory normal.        Judgment: Judgment normal.    Assessment  Plan  Acute cystitis without hematuria - Plan: urine culture f/u  Acute vaginitis - Plan: urine cytology  Frequent falls s/p low back surgery x 2 most recent 12/11/20 and h/o neuropathy b/l legs  weakness  -appt with neurology may need emg/ncs b/l legs  F/u NS Dr. Cari Caraway upcoming  Disc with pt may need more aggressive rehab at [facility] currently only doing pt/ot 1x per week at home for each  Assistance with transportation Consult social work  Yeast vaginitis - Plan: fluconazole (DIFLUCAN) 150 MG tablet  Osteoporosis reclast infusion upcoming with [facility]   [facility] [employer] flu shot  utd  prevnar, pna 23 utd allergic to Tdap declines shingrix  covid 19 vaccine has not had due to allergies   mammo  08/06/20 negative    Colonoscopy 04/02/16 multiple polyps but tortuous colon GI does not want to do any further    Out of age window pap    DEXA 08/19/16 +osteoporosis on Forteo will need to repeat 1 year after Forteo use to see improvement rheumatology following [place].  -DEXA 10/12/17 with T score -4.1 worse on forteo unable to do any further bone densities per pt 07/07/2018 due to h/o multiple ortho surgeries  +Osteoporosis  Off forteo was on x 2 years F/u [facility] rheumatology Dr. Jefm Bryant reclast upcoming 2021    Seeing dermatology in [place] changed referral [facility] seen 12/01/18 to get closer to home and due for f/u 05/2019 will encourage pt to call  To schedule appt  Dr. Kellie Moor 11/2019   Dr. Rosine Door Retinal specialist 08/2017, was scheduled 12/2018, 04/24/19, 05/22/19 injection 09/2019 Q 8 weeks    Consider h/o in future with persistent leukocytosis     Echo 11/22/18 [facility]  INTERPRETATION    NORMAL LEFT VENTRICULAR SYSTOLIC FUNCTION WITH MILD LVH    NORMAL RIGHT VENTRICULAR SYSTOLIC FUNCTION    VALVULAR REGURGITATION: MODERATE AR, MODERATE MR, TRIVIAL TR    NO VALVULAR STENOSIS     repeat ech 10/06/19 echo [facility]   NORMAL LEFT VENTRICULAR SYSTOLIC FUNCTION    NORMAL RIGHT VENTRICULAR SYSTOLIC FUNCTION    VALVULAR REGURGITATION: MILD AR, MILD MR, TRIVIAL TR    NO VALVULAR STENOSIS  AORTIC SCLEROSIS    LIMITED ACOUSTIC WINDOWS    Provider: Dr. Olivia Mackie  [facility]

## 2021-02-27 NOTE — Patient Instructions (Addendum)
Preventing Falls and Fractures  Falls can be very serious, especially for older adults or people with osteoporosis  Falls can be caused by: Tripping or slipping Slow reflexes Balance problems Reduced muscle strength Poor vision or a recent change in prescription Illness and some medications (especially blood pressure pills, diuretics, heart medicines, muscle relaxants and sleep medications) Drinking alcohol  To prevent falls outdoors: Use a can or walker if needed Wear rubber-soled shoes so you don't slip DO NOT buy "shape up" shoes with rocker bottom soles if you have balance problems.  The thick soles and shape make it more difficult to keep your balance. Put kitty litter or salt on icy sidewalks Walk on the grass if the sidewalks are slick Avoid walking on uneven ground whenever possible  T prevent falls indoors: Keep rooms clutter-free, especially hallways, stairs and paths to light switches Remove throw rugs Install night lights, especially to and in the bathroom Turn on lights before going downstairs Keep a flashlight next to your bed Buy a cordless phone to keep with you instead of jumping up to answer the phone Install grab bars in the bathroom near the shower and toilet Install rails on both sides of the stairs.  Make sure the stairs are well lit Wear slippers with non-skid soles.  Do not walk around in stockings or socks  Balance problems and dizziness are not a normal part of growing older.  If you begin having balance problems or dizziness see your doctor.  Physical Therapy can help you with many balance problems, strengthening hip and leg muscles and with gait training.  To keep your bones healthy make sure you are getting enough calcium and Vitamin D each day.  Ask your doctor or pharmacist about supplements.  Regular weight-bearing exercise like walking, lifting weights or dancing can help strengthen bones and prevent osteoporosis.  It is important to avoid  accidents which may result in broken bones.  Here are a few ideas on how to make your home safer so you will be less likely to trip or fall.  Use nonskid mats or non slip strips in your shower or tub, on your bathroom floor and around sinks.  If you know that you have spilled water, wipe it up! In the bathroom, it is important to have properly installed grab bars on the walls or on the edge of the tub.  Towel racks are NOT strong enough for you to hold onto or to pull on for support. Stairs and hallways should have enough light.  Add lamps or night lights if you need ore light. It is good to have handrails on both sides of the stairs if possible.  Always fix broken handrails right away. It is important to see the edges of steps.  Paint the edges of outdoor steps white so you can see them better.  Put colored tape on the edge of inside steps. Throw-rugs are dangerous because they can slide.  Removing the rugs is the best idea, but if they must stay, add adhesive carpet tape to prevent slipping. Do not keep things on stairs or in the halls.  Remove small furniture that blocks the halls as it may cause you to trip.  Keep telephone and electrical cords out of the way where you walk. Always were sturdy, rubber-soled shoes for good support.  Never wear just socks, especially on the stairs.  Socks may cause you to slip or fall.  Do not wear full-length housecoats as you can easily trip  on the bottom.  Place the things you use the most on the shelves that are the easiest to reach.  If you use a stepstool, make sure it is in good condition.  If you feel unsteady, DO NOT climb, ask for help. If a health professional advises you to use a cane or walker, do not be ashamed.  These items can keep you from falling and breaking your bones. Treatment - Upcoming Encounters Upcoming Encounters Date Type Specialty Care Team Description  02/28/2021 Infusion Endocrinology Solum, Felipa Evener, MD   [place]    [facility] [place], [place] [address]   [phone] (Work)   [phone] (Fax)      03/06/2021 Office Visit Neurology Ray Church, [place]   [facility]   [place], [place] [address]   [phone] (Work)   [phone] (Fax)      03/10/2021 Office Visit Ophthalmology Rosine Door, [place] Book[place], [place], [place] [address]   [phone] (Work)   [phone] (Fax)      03/13/2021 [place] [facility] Ricard Dillon, [place] [address]   [place], [place] [address]   [phone] (Work)   [phone] (Fax)      03/26/2021 Office Visit Rheumatology Jefm Bryant, Carles Collet., MD   [place], [place] [address]   [phone] (Work)   [phone] (Fax)      06/05/2021 Ancillary Procedure Obstetrics and Gynecology Sherrie George, [place]   [place], [place] [address]   [phone] (Work)   [phone] (Fax)      06/05/2021 Office Visit Obstetrics and Gynecology Sherrie George, [place]   [place], [place] [address]   [phone] (Work)   [phone] [facility])      Neurology -call for appt for frequent falls and weakness    [facility]   [place], [place] [address]   [phone]   Girtha Hake, [place], [place] [address]   [phone] (Work)   [phone] (Fax)      [facility]   [place],  [address]   [phone]  Neurology

## 2021-02-28 DIAGNOSIS — M810 Age-related osteoporosis without current pathological fracture: Secondary | ICD-10-CM | POA: Diagnosis not present

## 2021-03-03 LAB — URINE CYTOLOGY ANCILLARY ONLY
Bacterial Vaginitis-Urine: NEGATIVE
Candida Urine: NEGATIVE

## 2021-03-04 ENCOUNTER — Telehealth: Payer: Self-pay

## 2021-03-04 NOTE — Telephone Encounter (Signed)
° °  Telephone encounter was:  Unsuccessful.  03/04/2021 Name: SAGE HAMMILL MRN: [id number] DOB: February 12, 1940  Unsuccessful outbound call made today to assist with:  Transportation Needs   Outreach Attempt:  1st Attempt  A HIPAA compliant voice message was left requesting a return call.  Instructed patient to call back at earliest convenience.    [place], Care Management  [phone] [address], [place], [place] [address] Phone: [phone] Email: [email or web address]

## 2021-03-05 ENCOUNTER — Telehealth: Payer: Self-pay

## 2021-03-05 NOTE — Telephone Encounter (Signed)
° °  Telephone encounter was:  Unsuccessful.  03/05/2021 Name: Kerri Carter MRN: [id number] DOB: 12-Aug-1940  Unsuccessful outbound call made today to assist with:  Transportation Needs   Outreach Attempt:  2nd Attempt  A HIPAA compliant voice message was left requesting a return call.  Instructed patient to call back at earliest convenience.    [place], Care Management  [phone] [address], [place], [place] [address] Phone: [phone] Email: [email or web address]

## 2021-03-06 ENCOUNTER — Telehealth: Payer: Self-pay

## 2021-03-06 DIAGNOSIS — R4189 Other symptoms and signs involving cognitive functions and awareness: Secondary | ICD-10-CM | POA: Diagnosis not present

## 2021-03-06 DIAGNOSIS — G459 Transient cerebral ischemic attack, unspecified: Secondary | ICD-10-CM | POA: Diagnosis not present

## 2021-03-06 DIAGNOSIS — M5136 Other intervertebral disc degeneration, lumbar region: Secondary | ICD-10-CM | POA: Diagnosis not present

## 2021-03-06 DIAGNOSIS — R296 Repeated falls: Secondary | ICD-10-CM | POA: Diagnosis not present

## 2021-03-06 DIAGNOSIS — R2689 Other abnormalities of gait and mobility: Secondary | ICD-10-CM | POA: Diagnosis not present

## 2021-03-06 NOTE — Telephone Encounter (Signed)
° °  Telephone encounter was:  Unsuccessful.  03/06/2021 Name: Kerri Carter MRN: [id number] DOB: [birth year]  Unsuccessful outbound call made today to assist with:  Transportation Needs   Outreach Attempt:  3rd Attempt.  Referral closed unable to contact patient.  A HIPAA compliant voice message was left requesting a return call.  Instructed patient to call back at earliest convenience.    [place], Care Management  [phone] [address], [place], [place] [address] Phone: [phone] Email: [email or web address]

## 2021-03-07 ENCOUNTER — Telehealth: Payer: Self-pay

## 2021-03-07 NOTE — Telephone Encounter (Signed)
° °  Telephone encounter was:  Successful.  03/07/2021 Name: Kerri Carter MRN: [id number] DOB: 27-Oct-1940  Kerri Carter is a 81 y.o. year old female who is a primary care patient of [facility], Nino Glow, MD . The community resource team was consulted for assistance with Transportation Needs   Care guide performed the following interventions: Patient provided with information about care guide support team and interviewed to confirm resource needs. Patient called back with concerns for tansportation and getting her out of the house and down the ramp for her appointment. Follow Up Plan:  Care guide will follow up with patient by phone over the next day    [place], Care Management  [phone] [address], [place], [place] [address] Phone: [phone] Email: [email or web address]

## 2021-03-10 DIAGNOSIS — D3131 Benign neoplasm of right choroid: Secondary | ICD-10-CM | POA: Diagnosis not present

## 2021-03-10 DIAGNOSIS — H04123 Dry eye syndrome of bilateral lacrimal glands: Secondary | ICD-10-CM | POA: Diagnosis not present

## 2021-03-10 DIAGNOSIS — H43813 Vitreous degeneration, bilateral: Secondary | ICD-10-CM | POA: Diagnosis not present

## 2021-03-10 DIAGNOSIS — H353211 Exudative age-related macular degeneration, right eye, with active choroidal neovascularization: Secondary | ICD-10-CM | POA: Diagnosis not present

## 2021-03-10 DIAGNOSIS — H16143 Punctate keratitis, bilateral: Secondary | ICD-10-CM | POA: Diagnosis not present

## 2021-03-10 DIAGNOSIS — Z961 Presence of intraocular lens: Secondary | ICD-10-CM | POA: Diagnosis not present

## 2021-03-10 DIAGNOSIS — H35363 Drusen (degenerative) of macula, bilateral: Secondary | ICD-10-CM | POA: Diagnosis not present

## 2021-03-10 DIAGNOSIS — H353122 Nonexudative age-related macular degeneration, left eye, intermediate dry stage: Secondary | ICD-10-CM | POA: Diagnosis not present

## 2021-03-12 ENCOUNTER — Ambulatory Visit: Payer: Medicare PPO

## 2021-03-12 ENCOUNTER — Encounter: Payer: Self-pay | Admitting: Internal Medicine

## 2021-03-14 ENCOUNTER — Other Ambulatory Visit: Payer: Self-pay | Admitting: Neurosurgery

## 2021-03-14 DIAGNOSIS — M5441 Lumbago with sciatica, right side: Secondary | ICD-10-CM

## 2021-03-14 DIAGNOSIS — G8929 Other chronic pain: Secondary | ICD-10-CM

## 2021-03-14 DIAGNOSIS — Z9889 Other specified postprocedural states: Secondary | ICD-10-CM

## 2021-03-18 ENCOUNTER — Ambulatory Visit: Payer: Medicare PPO | Admitting: Pulmonary Disease

## 2021-03-22 ENCOUNTER — Other Ambulatory Visit: Payer: Self-pay | Admitting: Internal Medicine

## 2021-03-22 DIAGNOSIS — J309 Allergic rhinitis, unspecified: Secondary | ICD-10-CM

## 2021-03-22 DIAGNOSIS — K219 Gastro-esophageal reflux disease without esophagitis: Secondary | ICD-10-CM

## 2021-03-23 IMAGING — MR MR HEAD W/O CM
4 series · 46 of 48 positions shown · non-contrast
Comparison: Head CT 03/24/2020 and MRI 07/13/2015

CLINICAL DATA: Slurred speech.

EXAM:
MRI HEAD WITHOUT CONTRAST
TECHNIQUE: Multiplanar, multiecho pulse sequences of the brain and surrounding
structures were obtained without intravenous contrast.

[Series 2: ax dwi_tracew · axial · 3.0mm · 0.71mm/px · z∈[-61,+102]mm · 18 of 112 slices shown]
[im 1/112]
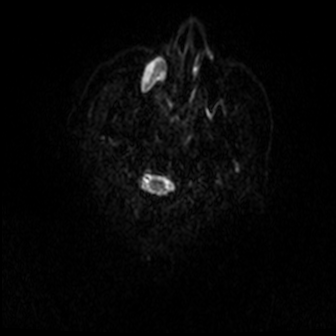
[im 7/112]
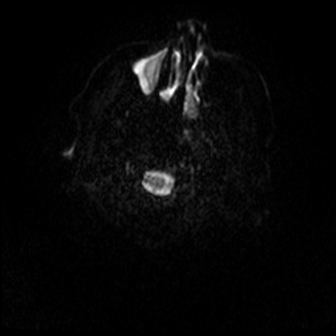
[im 14/112]
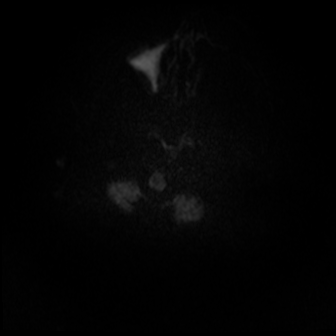
[im 20/112]
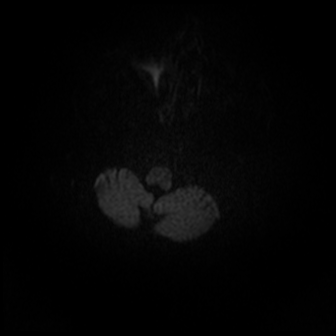
[im 27/112]
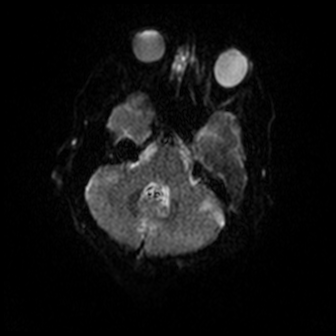
[im 33/112]
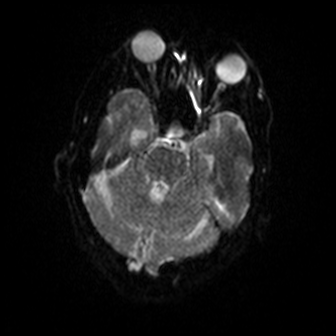
[im 40/112]
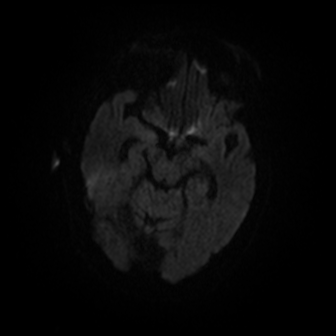
[im 46/112]
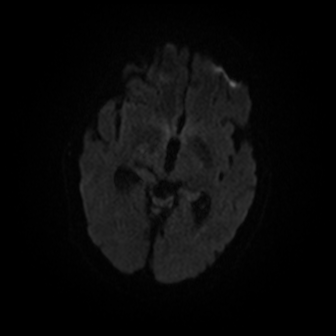
[im 53/112]
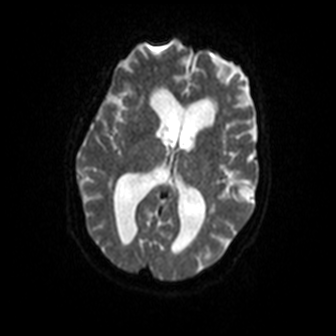
[im 59/112]
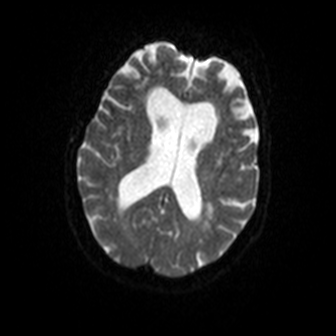
[im 66/112]
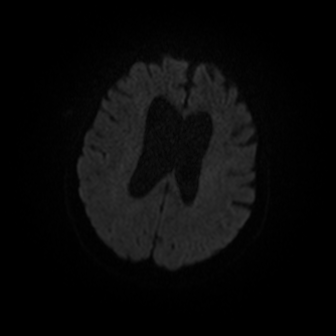
[im 72/112]
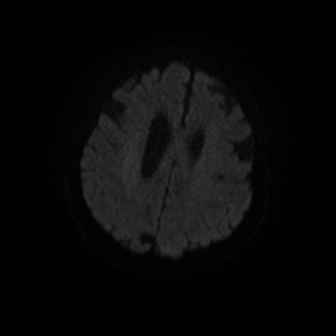
[im 79/112]
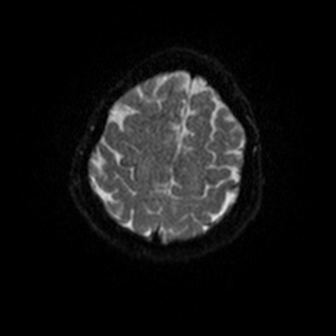
[im 85/112]
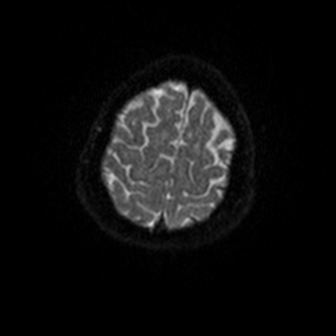
[im 92/112]
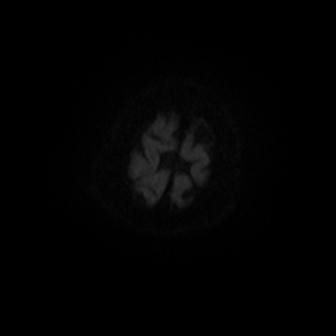
[im 98/112]
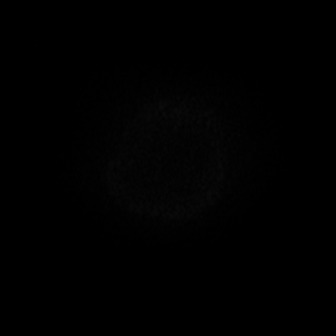
[im 105/112]
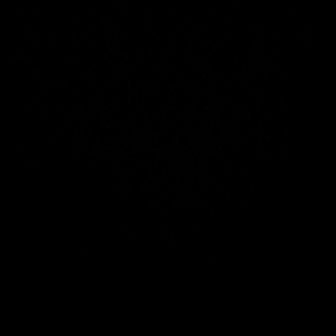
[im 112/112]
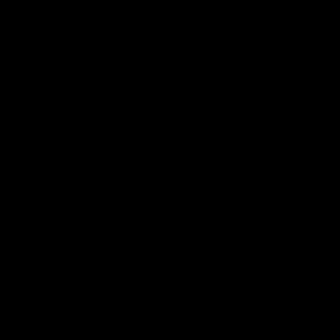

[Series 3: ax dwi_adc · axial · 3.0mm · 0.71mm/px · z∈[-61,+90]mm · 9 of 52 slices shown]
[im 1/52]
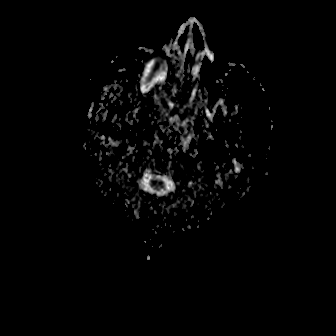
[im 7/52]
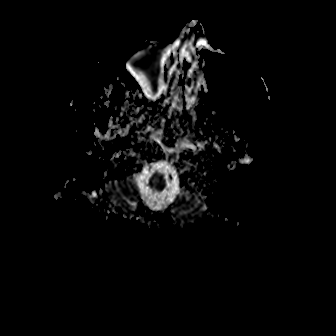
[im 13/52]
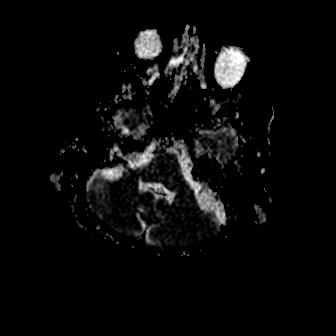
[im 20/52]
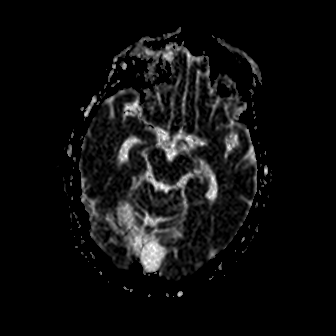
[im 26/52]
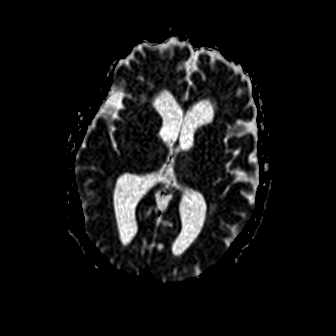
[im 32/52]
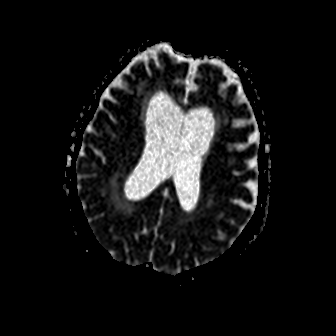
[im 39/52]
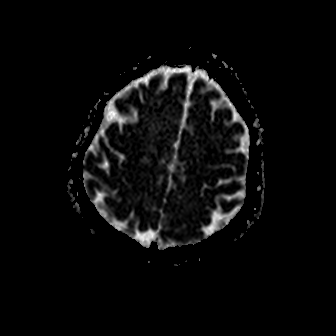
[im 45/52]
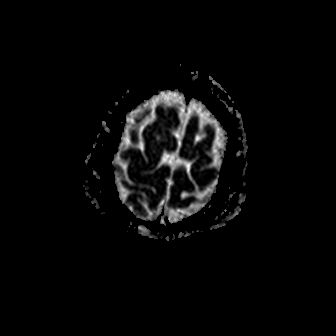
[im 52/52]
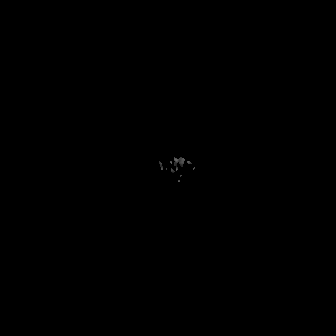

[Series 4: cor dwi_tracew · coronal · 5.0mm · 0.68mm/px · 12 of 80 slices shown]
[im 1/80]
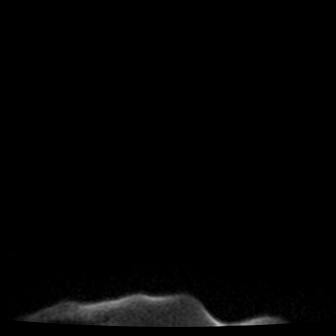
[im 7/80]
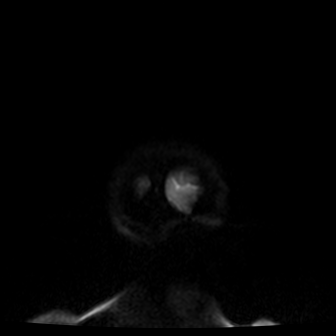
[im 13/80]
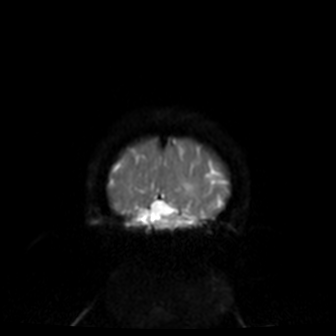
[im 19/80]
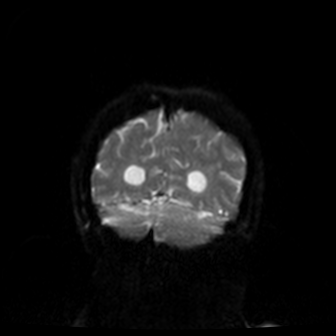
[im 25/80]
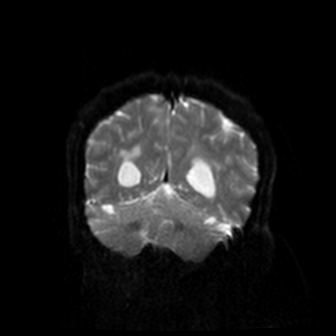
[im 31/80]
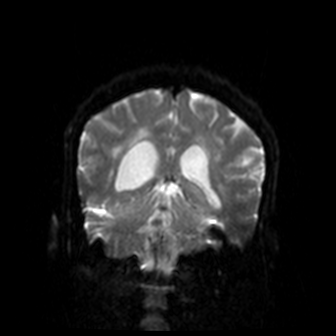
[im 37/80]
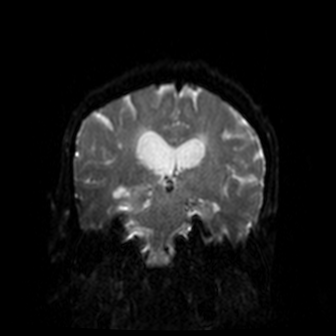
[im 43/80]
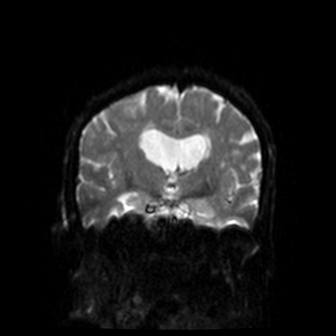
[im 49/80]
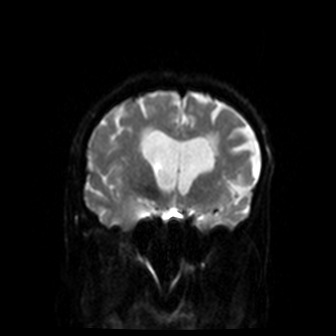
[im 55/80]
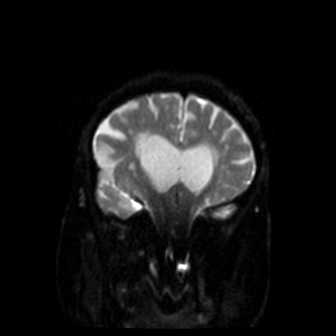
[im 67/80]
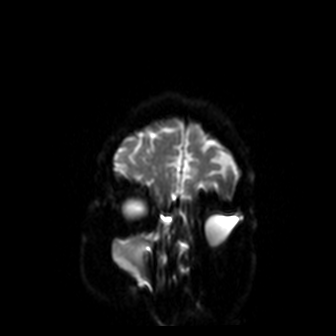
[im 80/80]
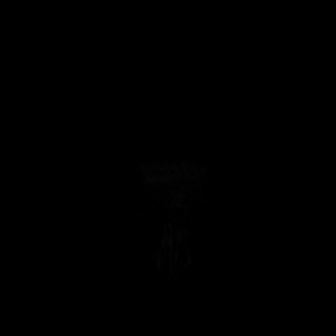

[Series 5: cor dwi_adc · coronal · 5.0mm · 0.68mm/px · 7 of 40 slices shown]
[im 1/40]
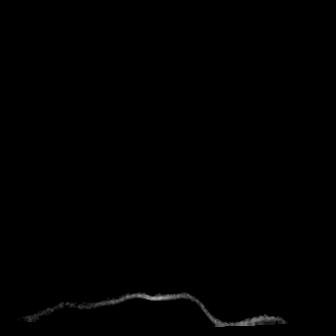
[im 7/40]
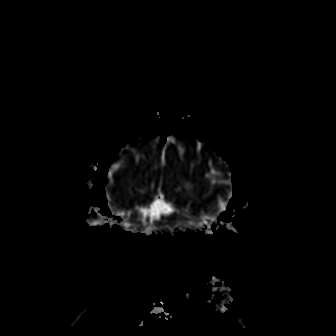
[im 14/40]
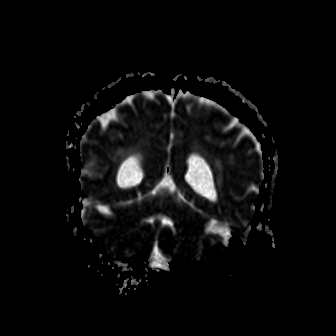
[im 20/40]
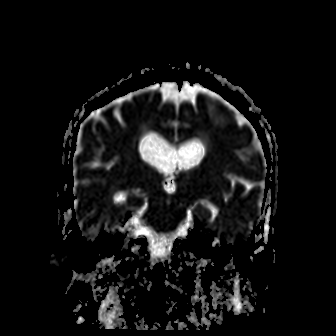
[im 27/40]
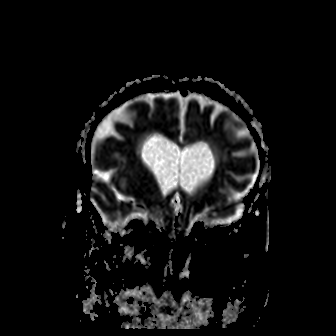
[im 33/40]
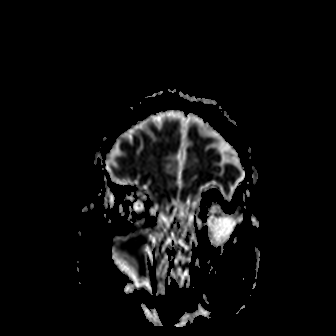
[im 40/40]
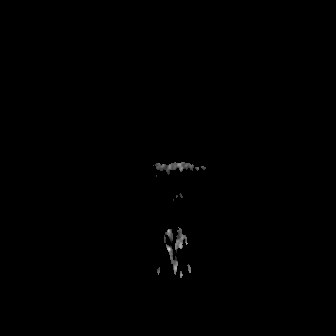

[46 of 48 positions shown; findings below may reference images not displayed]

FINDINGS: A limited MRI was performed at the request of the stroke
neurologist. Only axial and coronal diffusion imaging was obtained.

There is no acute infarct. Moderate chronic cerebral white matter
disease is incompletely assessed on this abbreviated study. There is
moderate central predominant cerebral atrophy. No intracranial mass
effect or sizable extra-axial fluid collection is evident. Chronic
right maxillary sinusitis is again noted.
IMPRESSION: No acute infarct on this diffusion only examination.

## 2021-03-23 IMAGING — DX DG CHEST 1V PORT
1 series · 1 of 1 positions shown · non-contrast
Comparison: Chest radiograph dated 10/18/2019.

CLINICAL DATA: 79-year-old female with shortness of breath.

EXAM:
PORTABLE CHEST 1 VIEW

[chest ap]
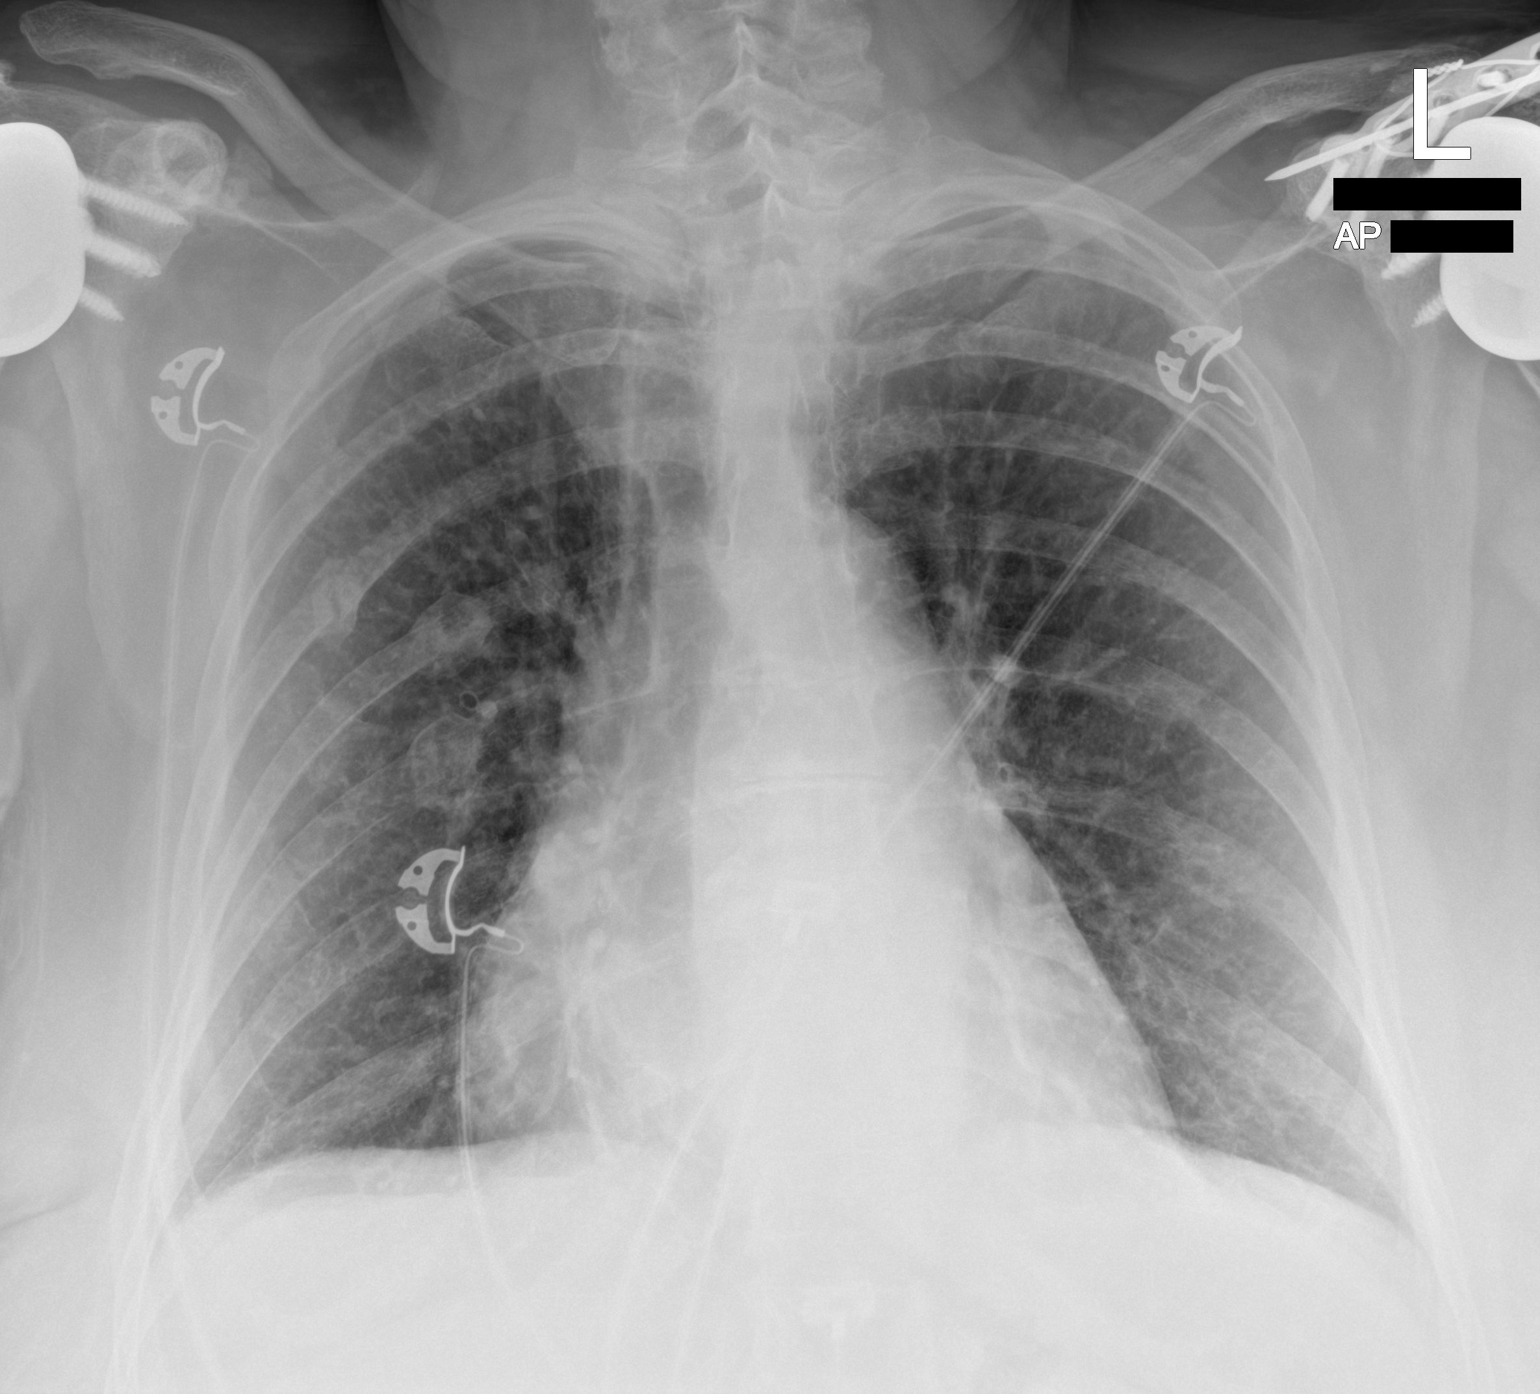

[1 of 1 positions shown; findings below may reference images not displayed]

FINDINGS: Mild diffuse interstitial prominence. No focal consolidation,
pleural effusion, pneumothorax. Mild cardiomegaly. No acute osseous
pathology. Old right posterior rib fractures. Partially visualized
bilateral shoulder arthroplasties.
IMPRESSION: No acute cardiopulmonary process.

## 2021-03-23 IMAGING — CT CT HEAD CODE STROKE
3 of 4 series · 13 of 47 positions shown, 15 images · non-contrast
Comparison: 01/10/2019

CLINICAL DATA: Code stroke.  Slurred speech.

EXAM:
CT HEAD WITHOUT CONTRAST
TECHNIQUE: Contiguous axial images were obtained from the base of the skull
through the vertex without intravenous contrast.

[Series 3: ax head wo · axial · 0.33mm/px · z∈[+380,+492]mm · 7 of 31 slices shown, 9 images]
[im 4/31  brain]
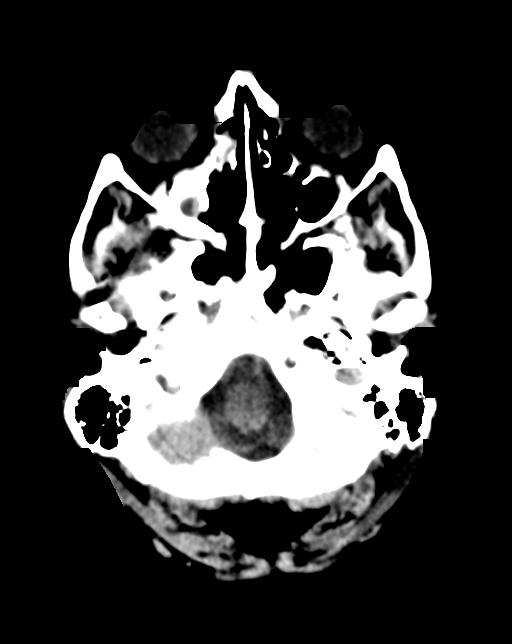
[im 4/31  bone]
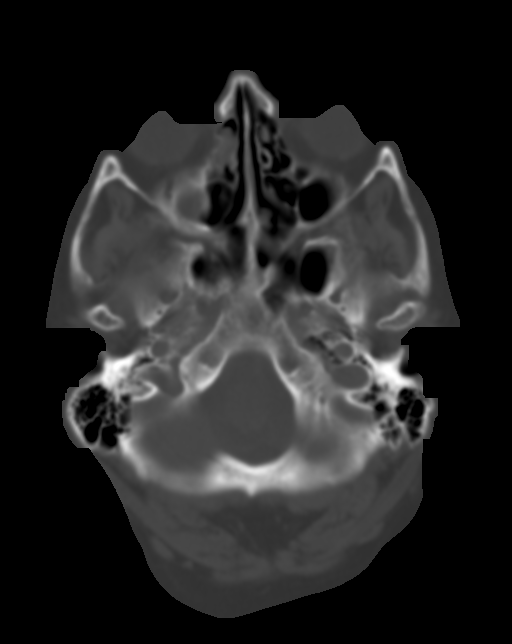
[im 8/31  brain]
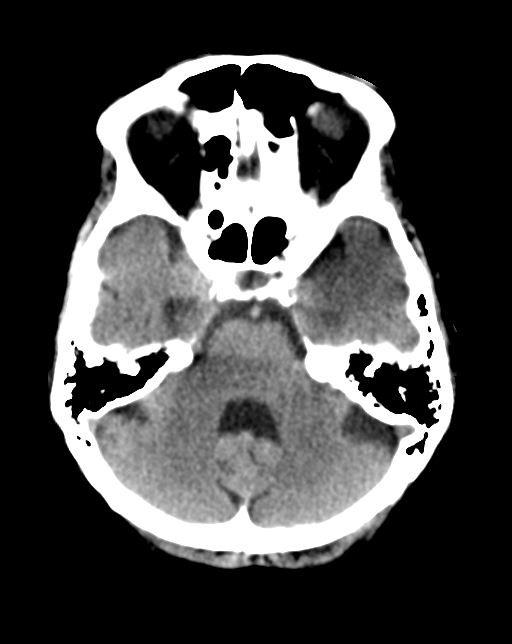
[im 12/31  brain]
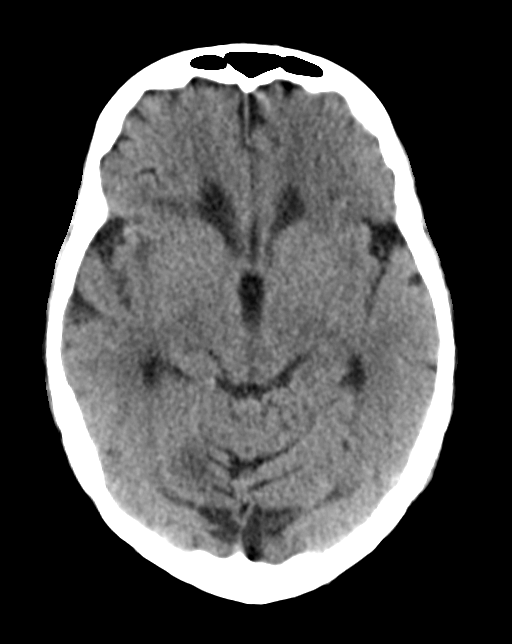
[im 16/31  brain]
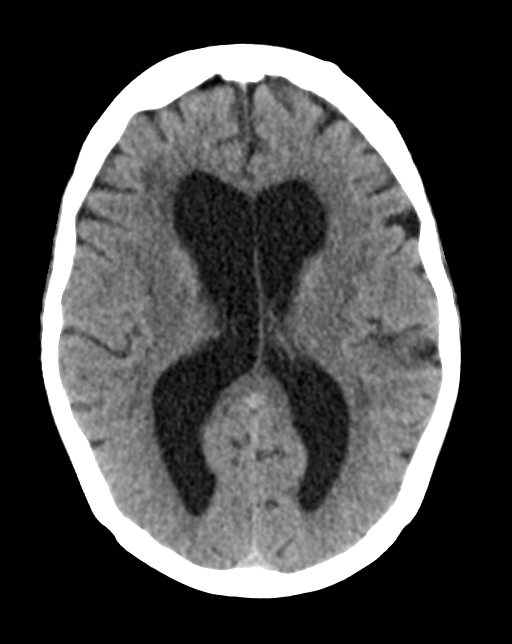
[im 19/31  brain]
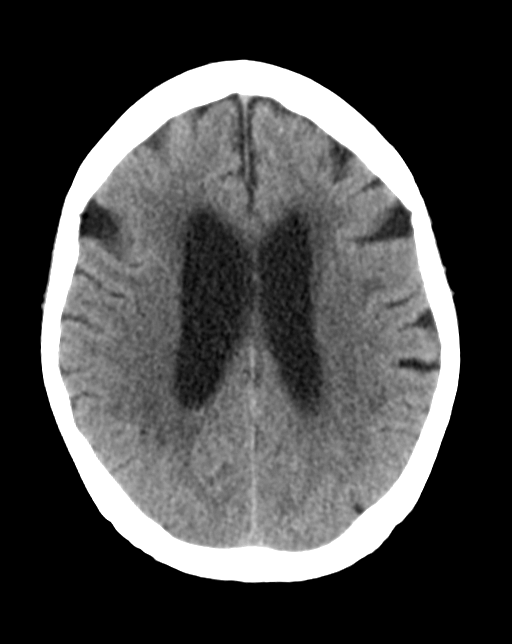
[im 19/31  bone]
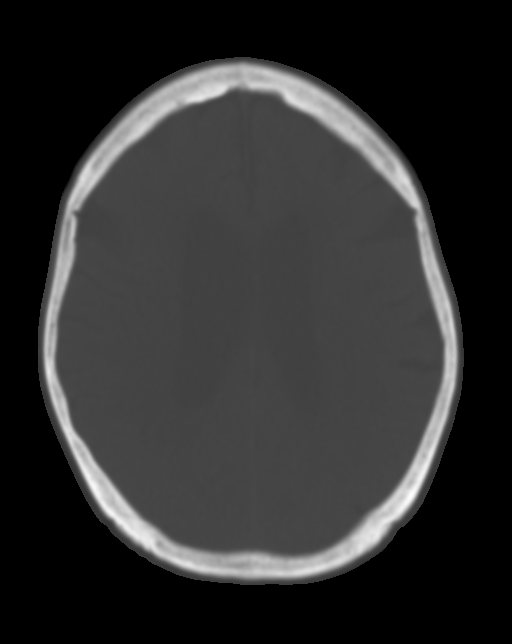
[im 23/31  brain]
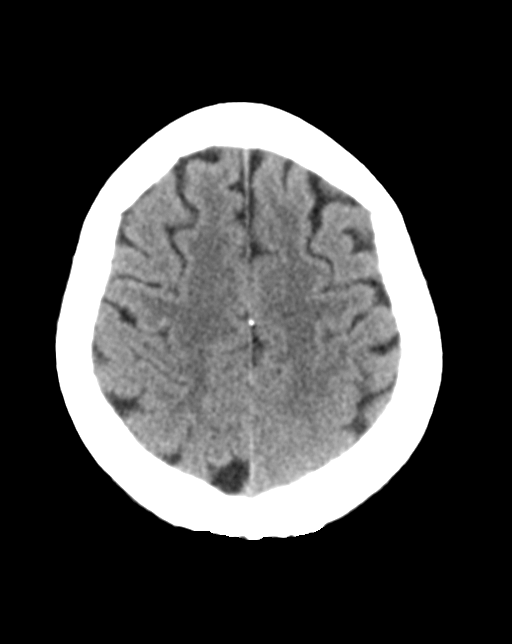
[im 27/31  brain]
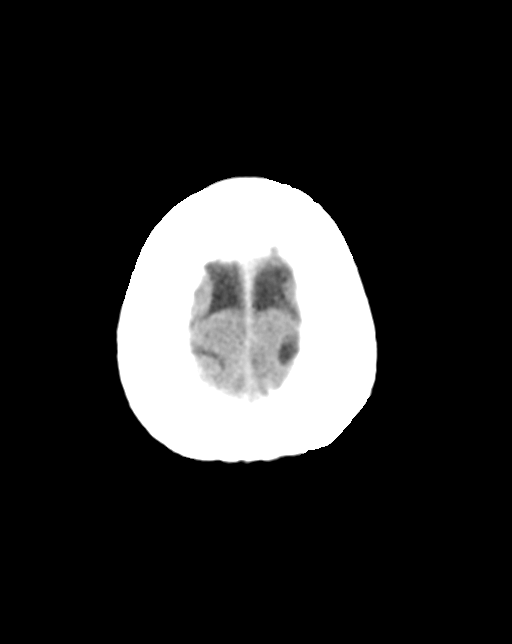

[Series 5: coronal soft tissue · coronal · 0.32mm/px · 3 of 63 slices shown]
[im 21/63  brain]
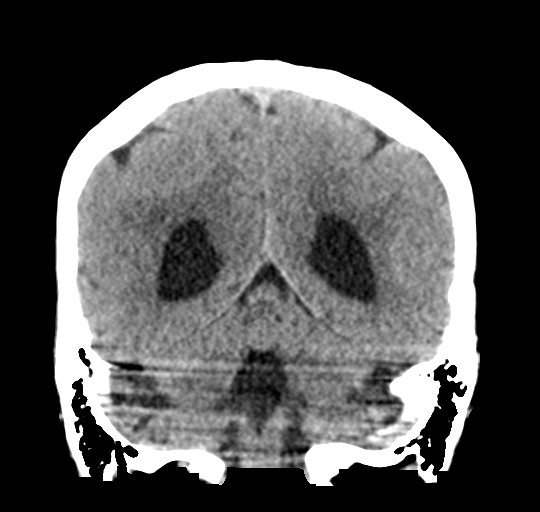
[im 28/63  brain]
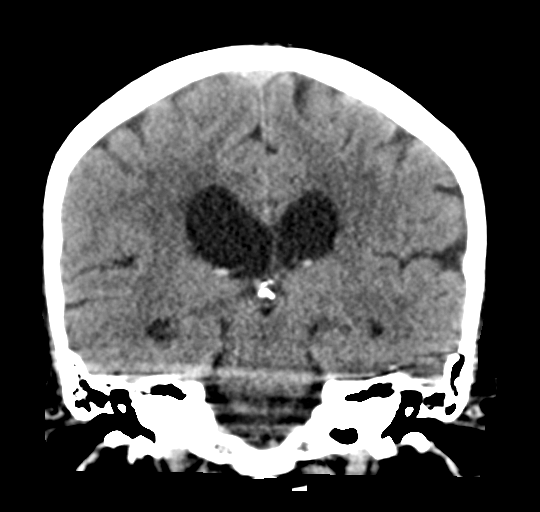
[im 35/63  brain]
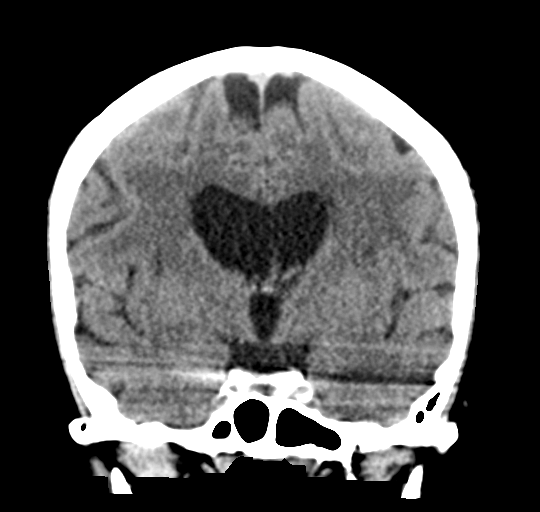

[Series 6: sagittal soft tissue · sagittal · 0.30mm/px · 3 of 51 slices shown]
[im 17/51  brain]
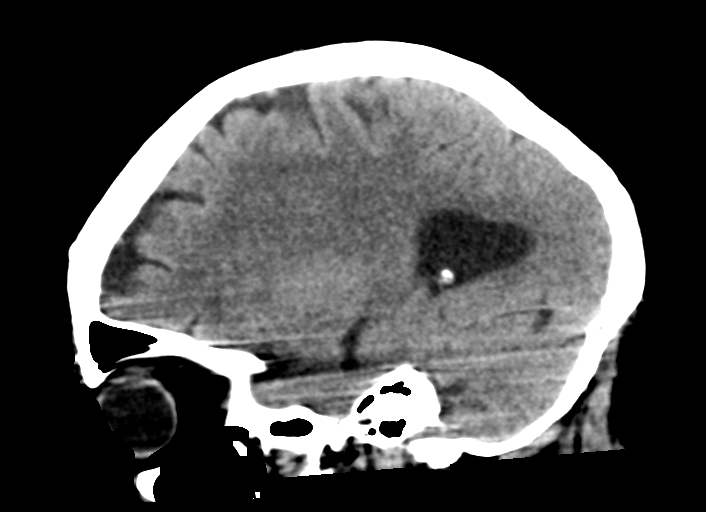
[im 26/51  brain]
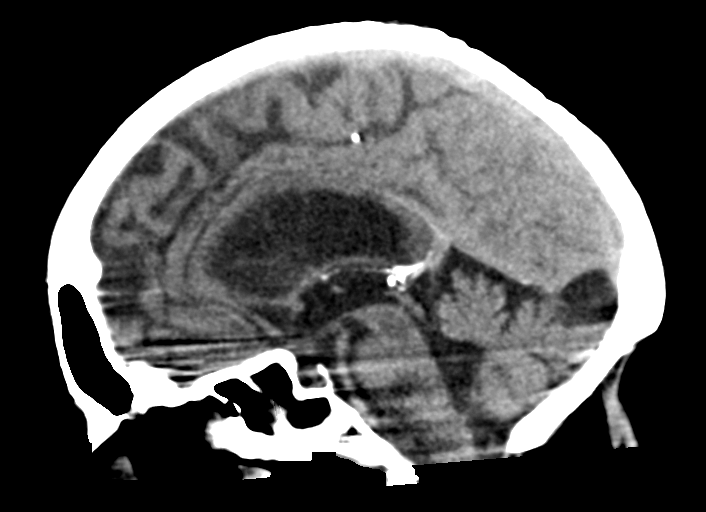
[im 34/51  brain]
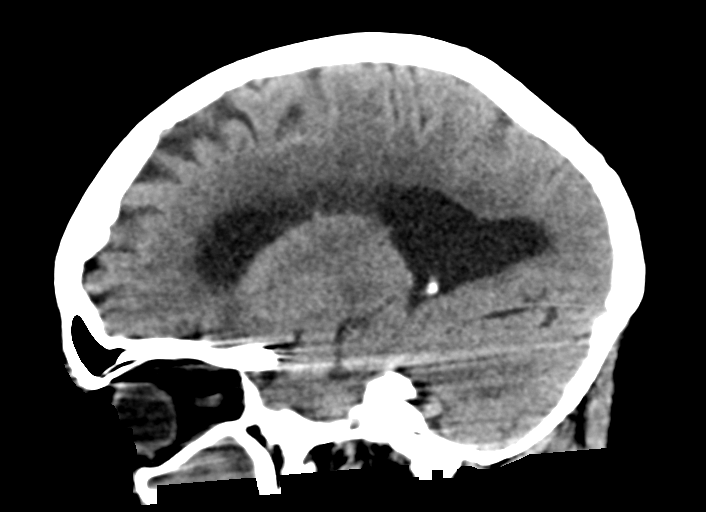

[13 of 47 positions shown; findings below may reference images not displayed]

FINDINGS: Brain: There is no evidence of an acute infarct, intracranial
hemorrhage, mass, midline shift, or extra-axial fluid collection.
Patchy hypodensities in the cerebral white matter bilaterally have
likely mildly progressed from the prior CT and are nonspecific but
compatible with moderate chronic small vessel ischemic disease.
There is moderate central predominant cerebral atrophy.

Vascular: Calcified atherosclerosis at the skull base. No hyperdense
vessel.

Skull: No fracture or suspicious osseous lesion.

Sinuses/Orbits: Chronic right maxillary sinusitis with persistent
complete opacification of the included portion of the sinus. Milder
right frontal and bilateral ethmoid sinus mucosal thickening. Clear
mastoid air cells. Bilateral cataract extraction.

Other: None.

ASPECTS (Alberta Stroke Program Early CT Score)

- Ganglionic level infarction (caudate, lentiform nuclei, internal
capsule, insula, M1-M3 cortex): 7

- Supraganglionic infarction (M4-M6 cortex): 3

Total score (0-10 with 10 being normal): 10
IMPRESSION: 1. No evidence of acute intracranial abnormality.
2. ASPECTS is 10.
3. Moderate chronic small vessel ischemic disease and cerebral
atrophy.

These results were called by telephone at the time of interpretation
on 03/24/2020 at [DATE] to Dr. Gulshan, who verbally acknowledged
these results.

## 2021-03-24 ENCOUNTER — Telehealth: Payer: Self-pay | Admitting: Internal Medicine

## 2021-03-24 NOTE — Telephone Encounter (Signed)
Patient's son calling in stating that the referral to Psychiatry need to be redone. States that the Patient is scheduled to be seen 04/01/21 at [facility]. They state they will need a copy of or a new referral faxed to them.  Fax number [phone] to the attention of Ailene Ravel.   Informed the Patient's son that I will request the renewal of the referral. That this has to be approved by the provider and then added to the que for our referrals coordinator. Informed the Patient's son that I could not guarantee the referral would be completed by 04/01/21. Informed Patient's son that should she be seen before referral is authorized, depending on Patient insurance, they could receive a bill.   He verbalized understanding.    Please advise  on placing new referral or if old referral can be sent.

## 2021-03-25 ENCOUNTER — Telehealth: Payer: Self-pay | Admitting: Internal Medicine

## 2021-03-25 NOTE — Telephone Encounter (Signed)
Pt need a new prescription of dicyclomine (BENTYL) 10 MG capsule sent to [facility]

## 2021-03-25 NOTE — Telephone Encounter (Signed)
Referral ofc notes ins card was sent to [facility].  Faxed to [phone]    [email or web address]  Esau Grew R on 03/25/2021 1137 - delivered at 03/25/2021 1137

## 2021-03-25 NOTE — Telephone Encounter (Signed)
Referral placed appt 04/01/21  Note from 02/27/21

## 2021-03-25 NOTE — Addendum Note (Signed)
Addended by: Orland Mustard on: 03/25/2021 11:20 AM   Modules accepted: Orders

## 2021-03-25 NOTE — Telephone Encounter (Signed)
Medication prescribed by Dr Vicente Males. Gave Patient information below to call and request a refill.   Jonathon Bellows, MD  [place], [place] [address]  Phone:  [phone].   Patient did not have a pen so had me call back and leave info on her voicemail.

## 2021-03-26 NOTE — Telephone Encounter (Signed)
Noted  

## 2021-03-31 DIAGNOSIS — M1812 Unilateral primary osteoarthritis of first carpometacarpal joint, left hand: Secondary | ICD-10-CM | POA: Diagnosis not present

## 2021-03-31 DIAGNOSIS — M19041 Primary osteoarthritis, right hand: Secondary | ICD-10-CM | POA: Diagnosis not present

## 2021-03-31 DIAGNOSIS — Z79899 Other long term (current) drug therapy: Secondary | ICD-10-CM | POA: Diagnosis not present

## 2021-03-31 DIAGNOSIS — M810 Age-related osteoporosis without current pathological fracture: Secondary | ICD-10-CM | POA: Diagnosis not present

## 2021-03-31 DIAGNOSIS — J849 Interstitial pulmonary disease, unspecified: Secondary | ICD-10-CM | POA: Diagnosis not present

## 2021-03-31 DIAGNOSIS — M0579 Rheumatoid arthritis with rheumatoid factor of multiple sites without organ or systems involvement: Secondary | ICD-10-CM | POA: Diagnosis not present

## 2021-04-01 ENCOUNTER — Ambulatory Visit: Payer: Medicare PPO

## 2021-04-01 DIAGNOSIS — Z79891 Long term (current) use of opiate analgesic: Secondary | ICD-10-CM | POA: Diagnosis not present

## 2021-04-01 DIAGNOSIS — F3176 Bipolar disorder, in full remission, most recent episode depressed: Secondary | ICD-10-CM | POA: Diagnosis not present

## 2021-04-04 ENCOUNTER — Other Ambulatory Visit: Payer: Self-pay

## 2021-04-04 ENCOUNTER — Ambulatory Visit
Admission: RE | Admit: 2021-04-04 | Discharge: 2021-04-04 | Disposition: A | Discharge status: Home / Self Care | Payer: Medicare PPO | Source: Ambulatory Visit | Attending: Neurosurgery | Admitting: Neurosurgery

## 2021-04-04 DIAGNOSIS — Z9889 Other specified postprocedural states: Secondary | ICD-10-CM | POA: Insufficient documentation

## 2021-04-04 DIAGNOSIS — M4316 Spondylolisthesis, lumbar region: Secondary | ICD-10-CM | POA: Diagnosis not present

## 2021-04-04 DIAGNOSIS — M5442 Lumbago with sciatica, left side: Secondary | ICD-10-CM | POA: Insufficient documentation

## 2021-04-04 DIAGNOSIS — M5441 Lumbago with sciatica, right side: Secondary | ICD-10-CM | POA: Insufficient documentation

## 2021-04-04 DIAGNOSIS — G8929 Other chronic pain: Secondary | ICD-10-CM | POA: Diagnosis not present

## 2021-04-04 DIAGNOSIS — M545 Low back pain, unspecified: Secondary | ICD-10-CM | POA: Diagnosis not present

## 2021-04-10 ENCOUNTER — Other Ambulatory Visit: Payer: Self-pay

## 2021-04-10 ENCOUNTER — Encounter: Payer: Self-pay | Admitting: Pulmonary Disease

## 2021-04-10 ENCOUNTER — Ambulatory Visit: Payer: Medicare PPO | Admitting: Pulmonary Disease

## 2021-04-10 VITALS — BP 120/72 | HR 89 | Temp 97.6°F | Ht 61.0 in | Wt 172.0 lb

## 2021-04-10 DIAGNOSIS — J8489 Other specified interstitial pulmonary diseases: Secondary | ICD-10-CM | POA: Diagnosis not present

## 2021-04-10 DIAGNOSIS — G4736 Sleep related hypoventilation in conditions classified elsewhere: Secondary | ICD-10-CM

## 2021-04-10 DIAGNOSIS — M0579 Rheumatoid arthritis with rheumatoid factor of multiple sites without organ or systems involvement: Secondary | ICD-10-CM

## 2021-04-10 DIAGNOSIS — J449 Chronic obstructive pulmonary disease, unspecified: Secondary | ICD-10-CM | POA: Diagnosis not present

## 2021-04-10 NOTE — Progress Notes (Signed)
Subjective:    Patient ID: Kerri Carter, female    DOB: 12-23-1940, 81 y.o.   MRN: [id number]  Patient Care Team: McLean-Scocuzza, Nino Glow, MD as PCP - General (Internal Medicine)  Chief Complaint  Patient presents with   Follow-up   HPI Kerri Carter is a 81 year old former smoker (quit 1976) who presents for follow-up for various issues to include asthma/COPD overlap syndrome, NSIP related to rheumatoid arthritis (rheumatoid lung) and cough.  This is a scheduled appointment.  She was last seen here on 04 December 2020 by Derl Barrow, NP.  At that time she was doing well.  She has chronic issues with cough that occur mostly in the morning and postprandially.  Is not been using DuoNeb and Pulmicort consistently which helps her cough significantly.  States that she "forgets".  She does not get any relief from metered-dose inhalers and she has difficulty using them due to her arthritis.  She has not had any fevers, chills or sweats.  No orthopnea or paroxysmal nocturnal dyspnea.  No hemoptysis, sputum has been white to yellow.  She is compliant with nocturnal oxygen and notes that she sleeps better and is less symptomatic with dyspnea and cough during the day with this treatment.  She has not been using her nebulizers consistently.  Stressed the importance of compliance with her medications. She does not endorse any other complaint today.   Review of Systems A 10 point review of systems was performed and it is as noted above otherwise negative.  Patient Active Problem List   Diagnosis Date Noted   Physical deconditioning 02/27/2021   Leg pain, bilateral    Respiratory failure with hypoxia ([place]) 01/12/2021   Acute respiratory failure with hypoxia ([place]) 01/12/2021   Frequent falls 12/18/2020   Hip pain 12/18/2020   Acute pain of left knee 12/18/2020   Elevated brain natriuretic peptide (BNP) level 12/18/2020   Weakness of both lower extremities 12/18/2020   Assistance needed for mobility 12/18/2020    Impaired mobility 12/18/2020   Urinary incontinence 12/18/2020   Pleural effusion on left 12/18/2020   Lumbar radiculopathy 12/11/2020   COPD exacerbation ([place]) 10/29/2020   Acromial process of scapula fracture 12/21/2019   NSIP (nonspecific interstitial pneumonitis) ([place]) 12/08/2019   Iliopsoas bursitis of right hip 08/29/2019   Chronic left shoulder pain 08/29/2019   Overweight (BMI 25.0-29.9) 08/29/2019   Hypertriglyceridemia 07/24/2019   Bipolar disorder, in full remission, most recent episode depressed ([place]) 07/13/2019   Essential hypertension 06/16/2019   Chronic pain 06/16/2019   Lymphedema 06/05/2019   Chronic venous insufficiency 05/25/2019   PAD (peripheral artery disease) ([place]) 05/25/2019   Leg edema 05/02/2019   Episodic mood disorder ([place]) [id number]   Complicated grief [id number]   At high risk for falls 03/16/2019   Pelvic mass in female 03/16/2019   Compression fracture of L4 vertebra ([place]) 02/21/2019   Collapsed vertebra, not elsewhere classified, lumbar region, initial encounter for fracture ([place]) 02/21/2019   Anxiety 01/20/2019   Pain due to onychomycosis of toenails of both feet 12/05/2018   Hav (hallux abducto valgus), unspecified laterality 12/05/2018   Closed fracture of acromial process of scapula 10/28/2018   Status post reverse arthroplasty of shoulder, left 07/26/2018   Interstitial pulmonary disease ([place]) 07/07/2018   Fatigue 07/07/2018   Avascular necrosis of left shoulder due to adverse effect of steroid therapy ([facility]) 01/20/2018   Other shoulder lesions, left shoulder 12/20/2017   Status post reverse total shoulder replacement, right 11/04/2017   Leukocytosis  10/19/2017   Allergic rhinitis 09/28/2017   Rotator cuff tendinitis, right 07/26/2017   Strain of right hip 02/26/2017   History of fracture of left hip 01/15/2017   Status post lumbar spinal fusion 01/15/2017   Dislocation of hip joint prosthesis ([place]) 01/08/2017   Status post hip  hemiarthroplasty 01/08/2017   Leg swelling 12/15/2016   Age-related osteoporosis without current pathological fracture 11/05/2016   Hip fracture ([place]) 10/31/2016   Polyneuropathy 10/28/2016   Left foot pain 10/19/2016   Spinal stenosis of lumbar region 09/15/2016   Osteoporosis 08/27/2016   Drug-induced osteoporosis 08/27/2016   Adnexal mass 08/25/2016   Prediabetes 08/25/2016   Coronary atherosclerosis 08/25/2016   History of syncope 08/25/2016   Hypokalemia 08/25/2016   Mixed bipolar I disorder ([place]) 10/09/2015   Stage 3b chronic kidney disease ([place])    Conversion disorder 08/04/2015   Hyperlipidemia 07/17/2015   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   Macular degeneration 07/17/2015   Insomnia 07/17/2015   Rheumatoid arthritis ([place]) 12/05/2014   Other specified postprocedural states 07/20/2013   Anxiety and depression 06/13/2013   Osteoarthritis of knee 06/07/2013   Cough 05/02/2012   SOB (shortness of breath) 10/13/2011   Valvular heart disease 10/13/2011   Asthma-COPD overlap syndrome ([place]) 09/09/2011   OSA (obstructive sleep apnea) 09/09/2011   Nocturnal hypoxemia 09/09/2011   Gastroesophageal reflux disease 02/24/2011   Single kidney 02/24/2011   H/O cardiac catheterization 02/24/2011   Renal artery stenosis ([place]) 02/24/2011   Social History   Tobacco Use   Smoking status: Former    Packs/day: 0.50    Years: 20.00    Pack years: 10.00    Types: Cigarettes    Quit date: 02/02/1974    Years since quitting: 47.2   Smokeless tobacco: Never  Substance Use Topics   Alcohol use: No   Allergies  Allergen Reactions   Ceftin [Cefuroxime Axetil] Anaphylaxis   Lisinopril Anaphylaxis   Sulfa Antibiotics Other (See Comments)    Face swelling   Sulfasalazine Anaphylaxis   Morphine Other (See Comments)    Per patient, low blood pressure issues that requires action to raise it back up. Can take small infrequent doses   Xarelto [Rivaroxaban]  Other (See Comments)    Stomach burning, bleeding, and tar in stool   Adhesive [Tape] Rash    Paper tape and tega derm OK   Antihistamines, Chlorpheniramine-Type Other (See Comments)    Makes pt hyper   Antivert [Meclizine Hcl] Other (See Comments)    Bladder will not empty   Aspirin Other (See Comments)    Sulfasalazine allergy cross reacts   Contrast Media [Iodinated Contrast Media] Rash    she is able to use betadine scrubs.   Decongestant [Pseudoephedrine Hcl] Other (See Comments)    Makes pt hyper   Doxycycline Other (See Comments)    GI upset   Levaquin [Levofloxacin In D5w] Rash   Polymyxin B Other (See Comments)    Facial rash   Tetanus Toxoids Rash and Other (See Comments)    Fever and hot to touch at injection site   Current Meds  Medication Sig   Adalimumab 40 MG/0.4ML PNKT Inject 40 mg into the skin every 14 (fourteen) days.   albuterol (VENTOLIN HFA) 108 (90 Base) MCG/ACT inhaler Inhale 2 puffs into the lungs every 6 (six) hours as needed for wheezing or shortness of breath.   ALPRAZolam (XANAX) 0.25 MG tablet TAKE 1 TABLET BY MOUTH AT BEDTIME AS NEEDED FOR ANXIETY  amLODipine (NORVASC) 2.5 MG tablet Take 1 tablet (2.5 mg total) by mouth daily.   benzonatate (TESSALON) 200 MG capsule Take 1 capsule (200 mg total) by mouth 3 (three) times daily as needed for cough.   budesonide (PULMICORT) 0.25 MG/2ML nebulizer solution USE 1 VIAL  IN  NEBULIZER TWICE  DAILY - rinse mouth after treatment   Cholecalciferol (VITAMIN D3) 50 MCG (2000 UT) TABS Take 4,000 Units by mouth daily.   cyclobenzaprine (FLEXERIL) 10 MG tablet Take 1 tablet (10 mg total) by mouth 3 (three) times daily as needed for muscle spasms.   dicyclomine (BENTYL) 10 MG capsule Take 1 capsule (10 mg total) by mouth 2 (two) times daily as needed for spasms. (Patient taking differently: Take 20 mg by mouth 2 (two) times daily.)   escitalopram (LEXAPRO) 10 MG tablet TAKE 1 TABLET BY MOUTH EVERY DAY   etodolac  (LODINE) 500 MG tablet Take 500 mg by mouth daily.   famotidine (PEPCID) 20 MG tablet TAKE 1 TABLET (20 MG TOTAL) BY MOUTH DAILY. BEFORE BREAKFAST OR DINNER   fluticasone (FLONASE) 50 MCG/ACT nasal spray Place 2 sprays into both nostrils daily. In am   gabapentin (NEURONTIN) 300 MG capsule Take 2 capsules (600 mg total) by mouth in the morning and at bedtime.   ipratropium-albuterol (DUONEB) 0.5-2.5 (3) MG/3ML SOLN TAKE 3 MLS BY NEBULIZATION 3 (THREE) TIMES DAILY AS NEEDED. (Patient taking differently: Take 3 mLs by nebulization 2 (two) times daily.)   lamoTRIgine (LAMICTAL) 100 MG tablet TAKE 1 TAB 2 TIMES DAILY. FURTHER REFILLS NEW PSYCH FOR ALL PSYCH MEDS ONLY TEMP SUPPLY FROM PCP (Patient taking differently: Take 100 mg by mouth 2 (two) times daily. TAKE 1 TAB 2 TIMES DAILY. FURTHER REFILLS NEW PSYCH FOR ALL PSYCH MEDS ONLY TEMP SUPPLY FROM PCP)   leflunomide (ARAVA) 20 MG tablet Take 1 tablet (20 mg total) by mouth daily.   lidocaine (LIDODERM) 5 % Place 1 patch onto the skin 2 (two) times daily as needed. Remove & Discard patch within 12 hours or as directed by MD (Patient taking differently: Place 1 patch onto the skin daily. Remove & Discard patch within 12 hours or as directed by MD)   lovastatin (MEVACOR) 20 MG tablet TAKE 1 TABLET BY MOUTH EVERYDAY AT BEDTIME *STOP TALKING '40MG'$ * (Patient taking differently: Take 20 mg by mouth at bedtime.)   methocarbamol (ROBAXIN) 500 MG tablet Take 500 mg by mouth 4 (four) times daily as needed.   mirabegron ER (MYRBETRIQ) 50 MG TB24 tablet Take 1 tablet (50 mg total) by mouth daily.   montelukast (SINGULAIR) 10 MG tablet TAKE 1 TABLET BY MOUTH EVERY DAY   multivitamin-lutein (OCUVITE-LUTEIN) CAPS capsule Take 1 capsule by mouth daily.   nebivolol (BYSTOLIC) 10 MG tablet Take 0.5 tablets (5 mg total) by mouth in the morning and at bedtime.   QUEtiapine (SEROQUEL) 25 MG tablet TAKE 1 TABLET (25 MG TOTAL) BY MOUTH AT BEDTIME. AGAIN LAST FILL FURTHER REFILLS  FROM PSYCHIATRY NO EXCEPTIONS   sucralfate (CARAFATE) 1 g tablet Take 1 tablet (1 g total) by mouth 2 (two) times daily.   zoledronic acid (RECLAST) 5 MG/100ML SOLN injection Inject 5 mg into the vein once.   Immunization History  Administered Date(s) Administered   Fluad Quad(high Dose 65+) 10/03/2018, 10/16/2019, 11/22/2020   Influenza Split 10/04/2012, 10/25/2013, 10/28/2016   Influenza Whole 11/03/2011   Influenza, High Dose Seasonal PF 10/04/2015, 11/03/2016   Influenza,inj,Quad PF,6+ Mos 10/31/2014   Influenza-Unspecified 01/02/2010, 11/24/2011, 10/21/2013,  10/31/2014, 09/28/2017   Pneumococcal Conjugate-13 10/21/2013   Pneumococcal Polysaccharide-23 11/07/2015       Objective:   Physical Exam BP 120/72 (BP Location: Left Arm, Patient Position: Sitting, Cuff Size: Normal)   Pulse 89   Temp 97.6 F (36.4 C) (Oral)   Ht '5\' 1"'$  (1.549 m)   Wt 172 lb (78 kg)   SpO2 94%   BMI 32.50 kg/m    GENERAL: Well-developed, well-nourished elderly woman, she presents on transport chair today.  HEAD: Normocephalic, atraumatic. EYES: Pupils equal, round, reactive to light.  No scleral icterus. MOUTH: Nose/mouth/throat not examined due to masking requirements for COVID 19. NECK: Supple. No thyromegaly. Trachea midline. No JVD.  No adenopathy. PULMONARY: Coarse breath sounds throughout, faint dry crackles at the bases more pronounced on the left base, occasional end expiratory wheeze. CARDIOVASCULAR: S1 and S2. Regular rate and rhythm.  Grade 1/6 systolic ejection murmur left sternal border, unchanged. GASTROINTESTINAL: Benign MUSCULOSKELETAL: Mild changes of RA both hands, no clubbing, no edema.  Limited range of motion of joints due to RA. NEUROLOGIC: No overt focal deficits.  Awake, alert, speech is fluent. SKIN: Intact,warm,dry. PSYCH: Mood and behavior appropriate.      Assessment & Plan:     ICD-10-CM   1. Asthma-COPD overlap syndrome (HCC)  J44.9    Continue DuoNeb and  Pulmicort at least twice a day May use DuoNeb and extra 2 times daily as needed    2. Nocturnal hypoxemia due to obstructive chronic bronchitis (HCC)  J44.9    G47.36    Patient compliant with oxygen supplementation Continue oxygen at 2 L/min nocturnally    3. NSIP (nonspecific interstitial pneumonitis) (HCC)  J84.89    This issue adds complexity to her management Related to rheumatoid arthritis/rheumatoid lung    4. Rheumatoid arthritis involving multiple sites with positive rheumatoid factor (HCC)  M05.79    This issue adds complexity to her management On adalimumab Follows with rheumatology     We will see the patient in follow-up in 6 to 8 weeks time she is to contact us prior to that time should any new difficulties arise.  Renold Don, MD Advanced Bronchoscopy P[facility]    *This note was dictated using voice recognition software/Dragon.  Despite best efforts to proofread, errors can occur which can change the meaning. Any transcriptional errors that result from this process are unintentional and may not be fully corrected at the time of dictation.

## 2021-04-10 NOTE — Patient Instructions (Signed)
Continue taking your ipratropium/albuterol via nebulizer twice a day.  You may use an extra treatment if needed. ? ?Continue taking budesonide nebulized via nebulizer twice a day. ? ?We will see you in follow-up in 6 to 8 weeks time call sooner should any new problems arise. ? ? ? ?

## 2021-04-10 NOTE — Progress Notes (Deleted)
   Subjective:    Patient ID: Kerri Carter, female    DOB: 1940-07-27, 81 y.o.   MRN: [id number]  HPI    Review of Systems     Objective:   Physical Exam        Assessment & Plan:

## 2021-04-15 ENCOUNTER — Other Ambulatory Visit: Payer: Self-pay | Admitting: Internal Medicine

## 2021-04-15 DIAGNOSIS — B3731 Acute candidiasis of vulva and vagina: Secondary | ICD-10-CM

## 2021-04-16 DIAGNOSIS — Z905 Acquired absence of kidney: Secondary | ICD-10-CM | POA: Diagnosis not present

## 2021-04-16 DIAGNOSIS — N1831 Chronic kidney disease, stage 3a: Secondary | ICD-10-CM | POA: Diagnosis not present

## 2021-04-16 DIAGNOSIS — N2581 Secondary hyperparathyroidism of renal origin: Secondary | ICD-10-CM | POA: Diagnosis not present

## 2021-04-16 DIAGNOSIS — I10 Essential (primary) hypertension: Secondary | ICD-10-CM | POA: Diagnosis not present

## 2021-04-21 DIAGNOSIS — I110 Hypertensive heart disease with heart failure: Secondary | ICD-10-CM | POA: Diagnosis not present

## 2021-04-21 DIAGNOSIS — K219 Gastro-esophageal reflux disease without esophagitis: Secondary | ICD-10-CM | POA: Diagnosis not present

## 2021-04-21 DIAGNOSIS — J439 Emphysema, unspecified: Secondary | ICD-10-CM | POA: Diagnosis not present

## 2021-04-21 DIAGNOSIS — J9610 Chronic respiratory failure, unspecified whether with hypoxia or hypercapnia: Secondary | ICD-10-CM | POA: Diagnosis not present

## 2021-04-21 DIAGNOSIS — Z7722 Contact with and (suspected) exposure to environmental tobacco smoke (acute) (chronic): Secondary | ICD-10-CM | POA: Diagnosis not present

## 2021-04-21 DIAGNOSIS — Z7951 Long term (current) use of inhaled steroids: Secondary | ICD-10-CM | POA: Diagnosis not present

## 2021-04-21 DIAGNOSIS — E785 Hyperlipidemia, unspecified: Secondary | ICD-10-CM | POA: Diagnosis not present

## 2021-04-21 DIAGNOSIS — I509 Heart failure, unspecified: Secondary | ICD-10-CM | POA: Diagnosis not present

## 2021-04-21 DIAGNOSIS — F325 Major depressive disorder, single episode, in full remission: Secondary | ICD-10-CM | POA: Diagnosis not present

## 2021-04-21 DIAGNOSIS — N3941 Urge incontinence: Secondary | ICD-10-CM | POA: Diagnosis not present

## 2021-04-21 DIAGNOSIS — Z6832 Body mass index (BMI) 32.0-32.9, adult: Secondary | ICD-10-CM | POA: Diagnosis not present

## 2021-04-21 DIAGNOSIS — M0550 Rheumatoid polyneuropathy with rheumatoid arthritis of unspecified site: Secondary | ICD-10-CM | POA: Diagnosis not present

## 2021-04-21 DIAGNOSIS — H409 Unspecified glaucoma: Secondary | ICD-10-CM | POA: Diagnosis not present

## 2021-04-21 DIAGNOSIS — Z791 Long term (current) use of non-steroidal anti-inflammatories (NSAID): Secondary | ICD-10-CM | POA: Diagnosis not present

## 2021-04-21 DIAGNOSIS — L309 Dermatitis, unspecified: Secondary | ICD-10-CM | POA: Diagnosis not present

## 2021-04-21 DIAGNOSIS — D84821 Immunodeficiency due to drugs: Secondary | ICD-10-CM | POA: Diagnosis not present

## 2021-04-21 DIAGNOSIS — J309 Allergic rhinitis, unspecified: Secondary | ICD-10-CM | POA: Diagnosis not present

## 2021-04-21 DIAGNOSIS — K5900 Constipation, unspecified: Secondary | ICD-10-CM | POA: Diagnosis not present

## 2021-04-22 DIAGNOSIS — M4316 Spondylolisthesis, lumbar region: Secondary | ICD-10-CM | POA: Diagnosis not present

## 2021-04-22 DIAGNOSIS — M5136 Other intervertebral disc degeneration, lumbar region: Secondary | ICD-10-CM | POA: Diagnosis not present

## 2021-04-22 DIAGNOSIS — M5416 Radiculopathy, lumbar region: Secondary | ICD-10-CM | POA: Diagnosis not present

## 2021-04-29 ENCOUNTER — Other Ambulatory Visit: Payer: Self-pay

## 2021-04-30 ENCOUNTER — Other Ambulatory Visit: Payer: Self-pay

## 2021-04-30 ENCOUNTER — Other Ambulatory Visit: Payer: Self-pay | Admitting: Neurosurgery

## 2021-04-30 DIAGNOSIS — Z789 Other specified health status: Secondary | ICD-10-CM

## 2021-04-30 DIAGNOSIS — Z01818 Encounter for other preprocedural examination: Secondary | ICD-10-CM

## 2021-04-30 IMAGING — CT CT CHEST HIGH RESOLUTION W/O CM
2 of 7 series · 14 of 36 positions shown, 17 images · non-contrast
Comparison: 10/13/2018

CLINICAL DATA: Follow-up interstitial lung disease, NSIP

EXAM:
CT CHEST WITHOUT CONTRAST
TECHNIQUE: Multidetector CT imaging of the chest was performed following the
standard protocol without intravenous contrast. High resolution
imaging of the lungs, as well as inspiratory and expiratory imaging,
was performed.

[Series 4: thorax 2.00 cor · coronal · 0.55mm/px · 3 of 153 slices shown]
[im 31/153  lung]
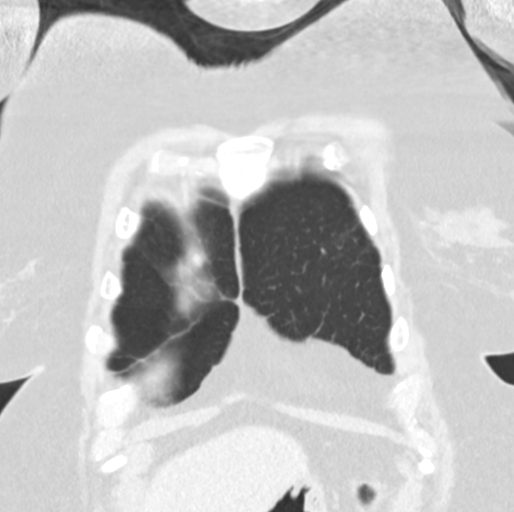
[im 61/153  lung]
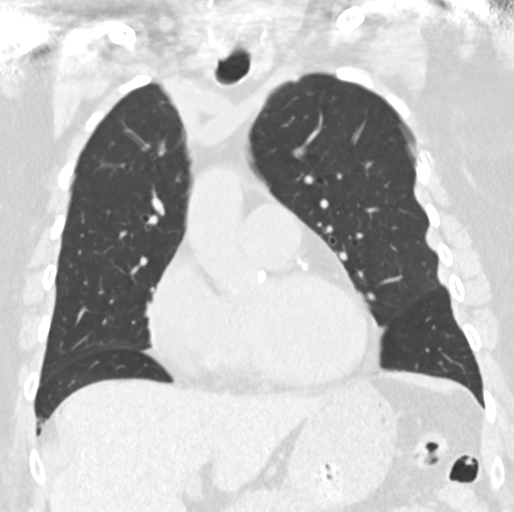
[im 92/153  lung]
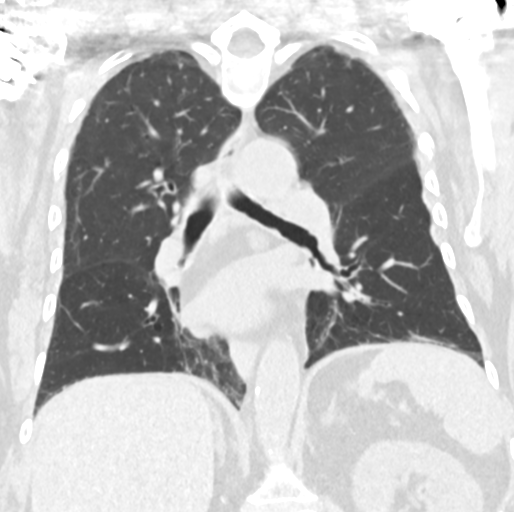

[Series 11: high res (id) thorax 1.00 ax · axial · 0.55mm/px · z∈[-1181,-943]mm · 11 of 282 slices shown, 14 images]
[im 22/282  mediastinal]
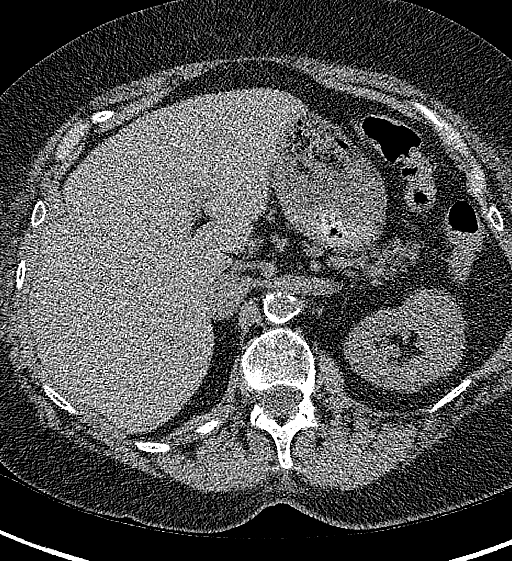
[im 22/282  lung]
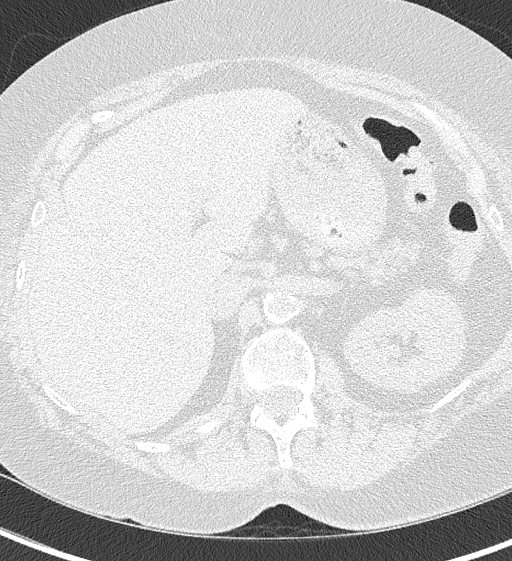
[im 44/282  lung]
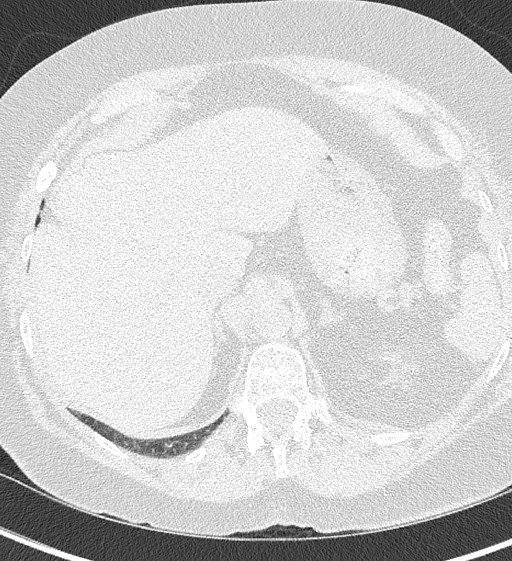
[im 65/282  lung]
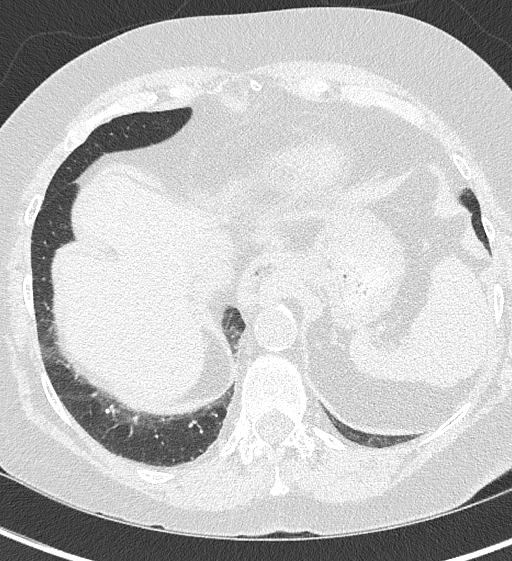
[im 87/282  lung]
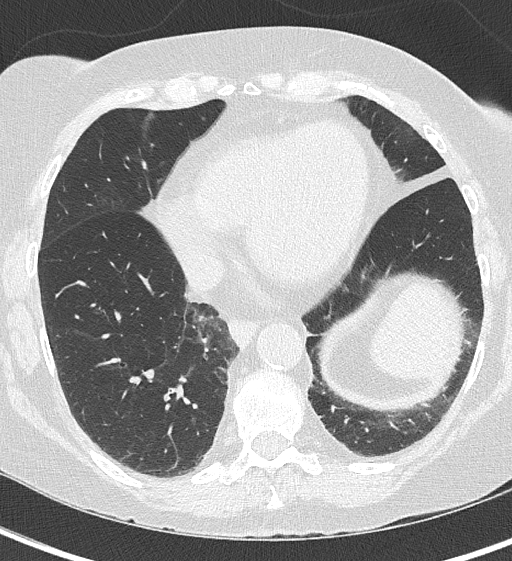
[im 109/282  mediastinal]
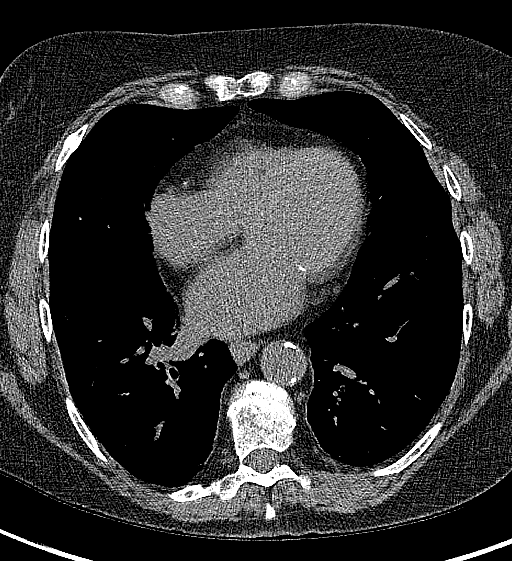
[im 109/282  lung]
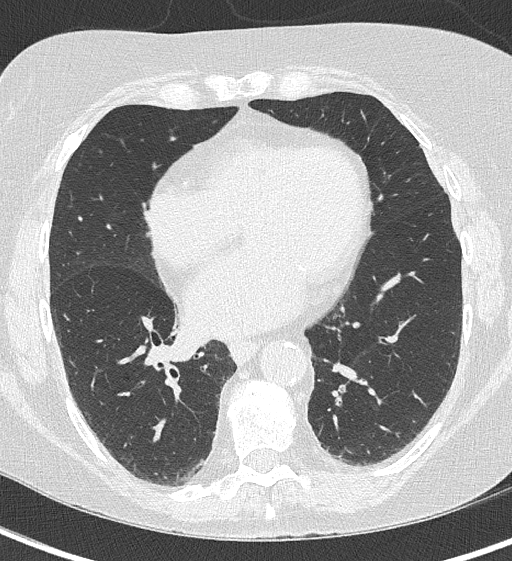
[im 152/282  lung]
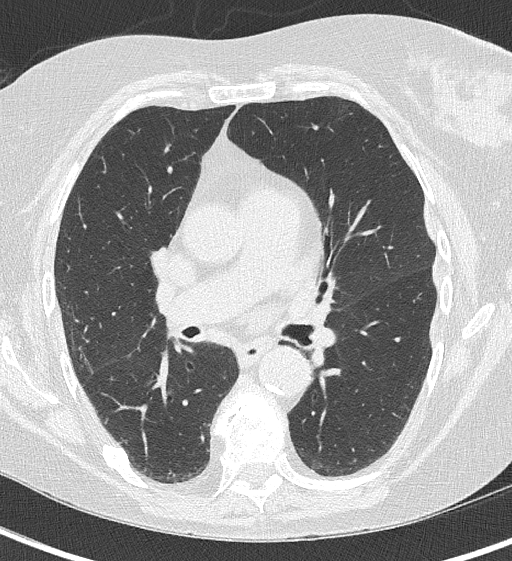
[im 173/282  lung]
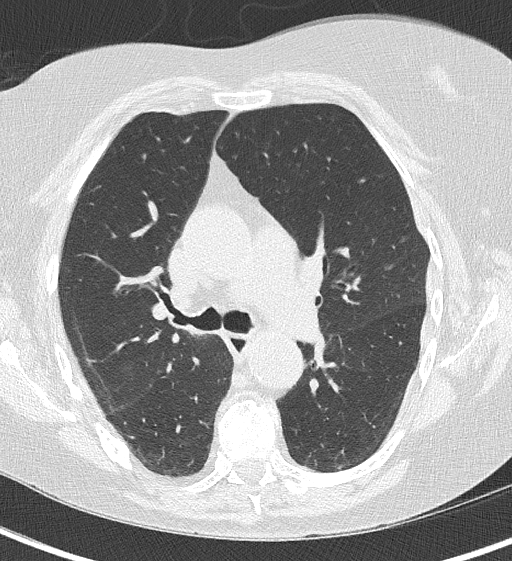
[im 195/282  lung]
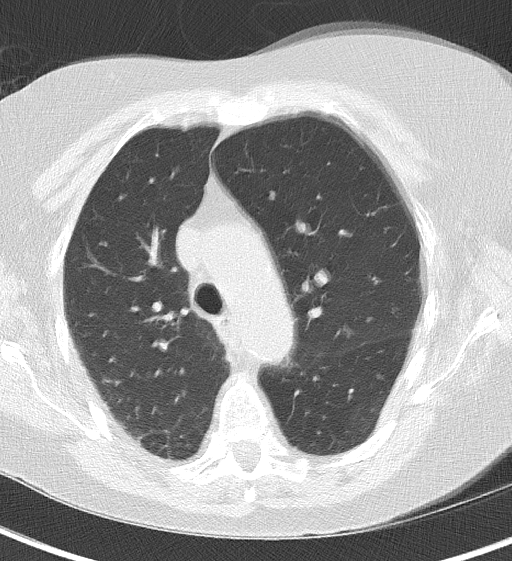
[im 217/282  mediastinal]
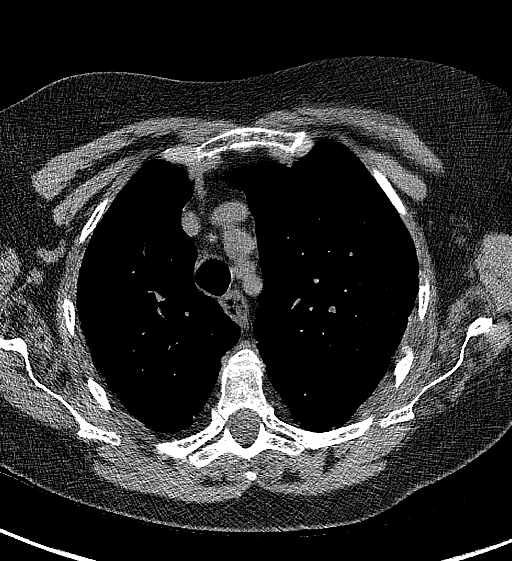
[im 217/282  lung]
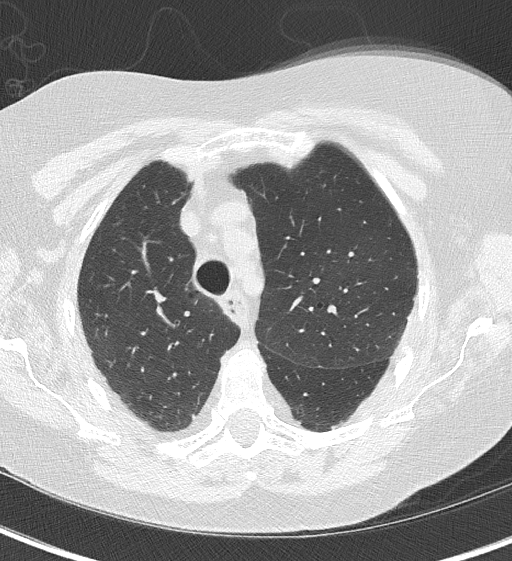
[im 238/282  lung]
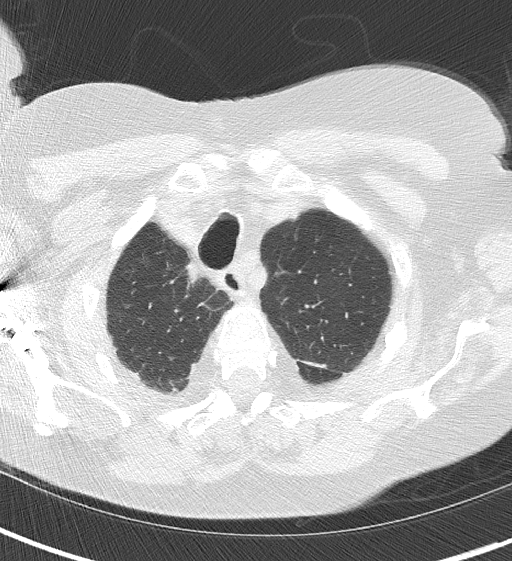
[im 260/282  lung]
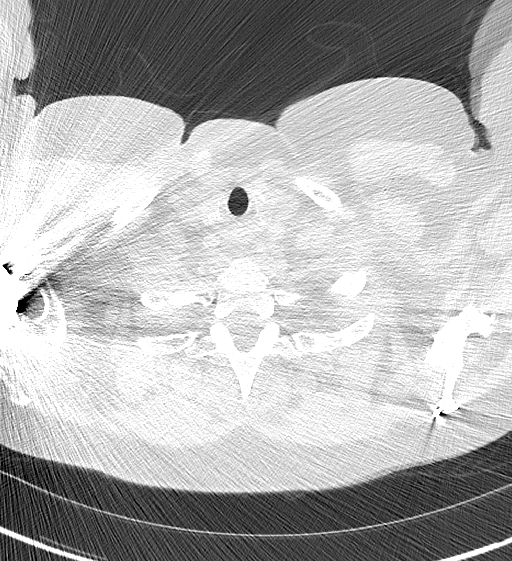

[14 of 36 positions shown; findings below may reference images not displayed]

FINDINGS: Cardiovascular: Aortic atherosclerosis. Normal heart size.
Three-vessel coronary artery calcifications. No pericardial
effusion.

Mediastinum/Nodes: No enlarged mediastinal, hilar, or axillary lymph
nodes. Thyroid gland, trachea, and esophagus demonstrate no
significant findings.

Lungs/Pleura: There is dependent bibasilar bronchial wall thickening
and extensive mucous plugging (series 11, image 21). There is
minimal, dependent irregular peripheral interstitial and
ground-glass opacity, similar to prior examination no significant
non dependent findings and no significant air trapping on expiratory
phase imaging. No pleural effusion or pneumothorax.

Upper Abdomen: No acute abnormality.

Musculoskeletal: No chest wall mass or suspicious bone lesions
identified. Status post bilateral shoulder arthroplasty.
IMPRESSION: 1. There is dependent bibasilar bronchial wall thickening and
extensive mucous plugging. There is minimal, dependent irregular
peripheral interstitial and ground-glass opacity, similar to prior
examination. Findings are generally most suggestive of sequelae of
prior inflammation or infection, particularly sequelae of
aspiration, without specific findings to suggest fibrotic
interstitial lung disease. If strictly characterized by ATS
pulmonary fibrosis criteria, these findings are in an early
"indeterminate for UIP" pattern, and could be followed with annual
ILD protocol CT if there is high, persistent clinical concern for
fibrotic interstitial lung disease.
2. Coronary artery disease.

Aortic Atherosclerosis ([id number]).

## 2021-04-30 IMAGING — US US THYROID
1 series · 14 of 25 positions shown · non-contrast
Comparison: None.

CLINICAL DATA: Goiter.

EXAM:
THYROID ULTRASOUND
TECHNIQUE: Ultrasound examination of the thyroid gland and adjacent soft
tissues was performed.

[Series 1: us thyroid · 0.07mm/px · 14 of 41 slices shown]
[im 1/41]
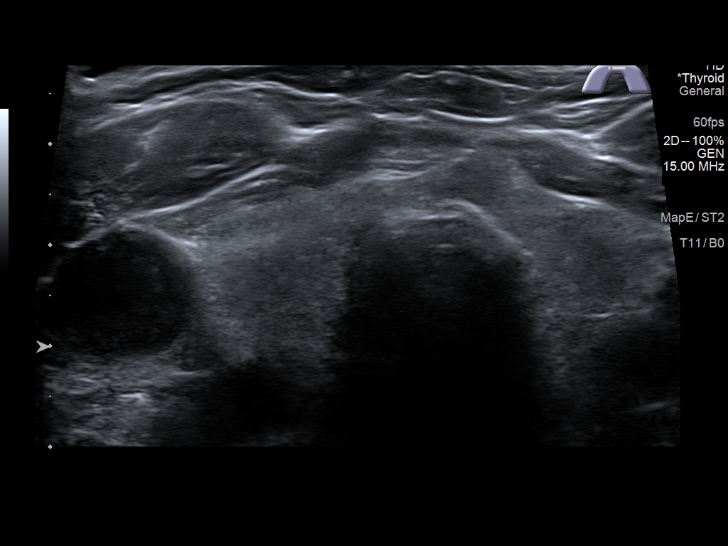
[im 4/41]
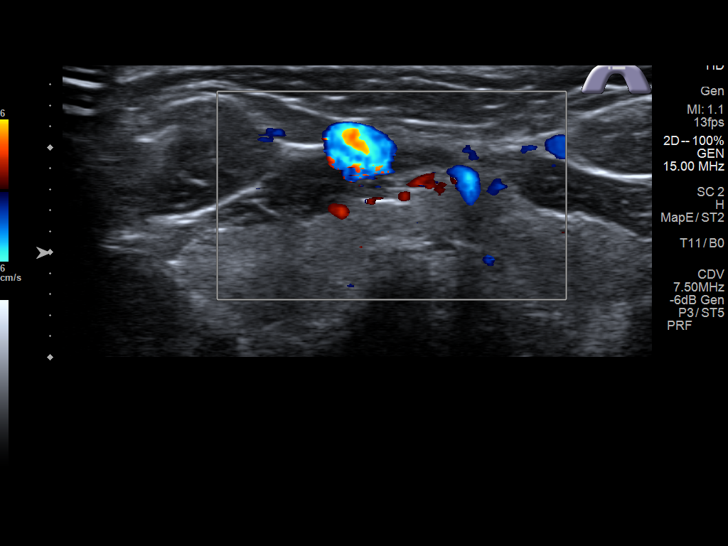
[im 7/41]
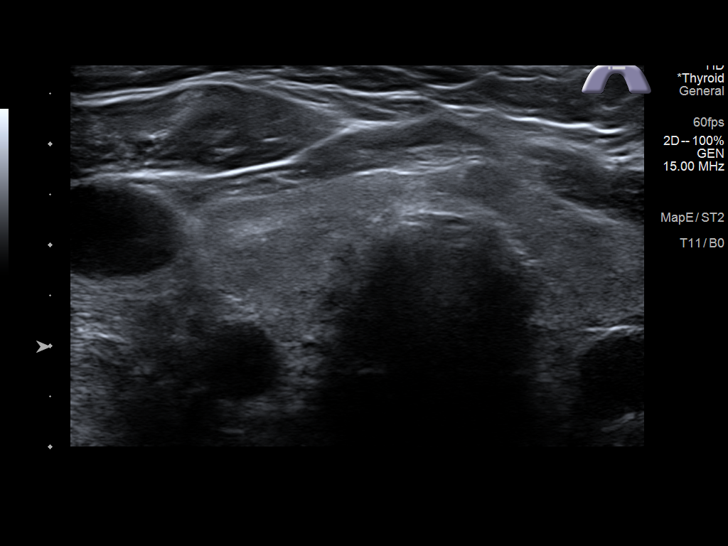
[im 11/41]
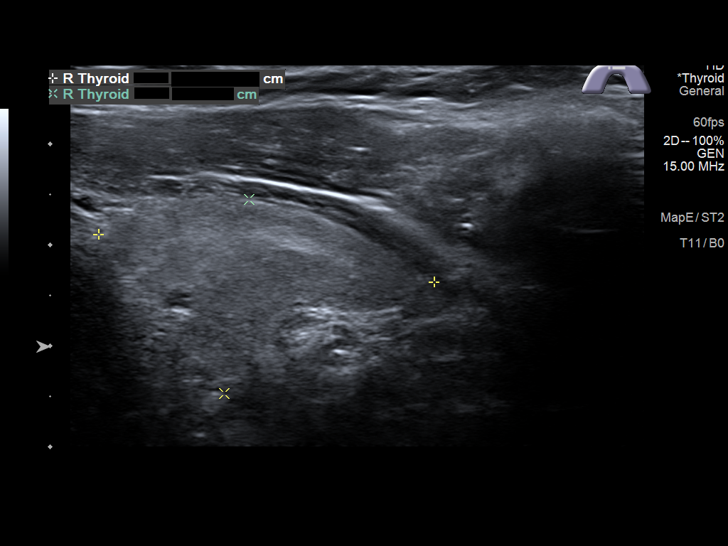
[im 14/41]
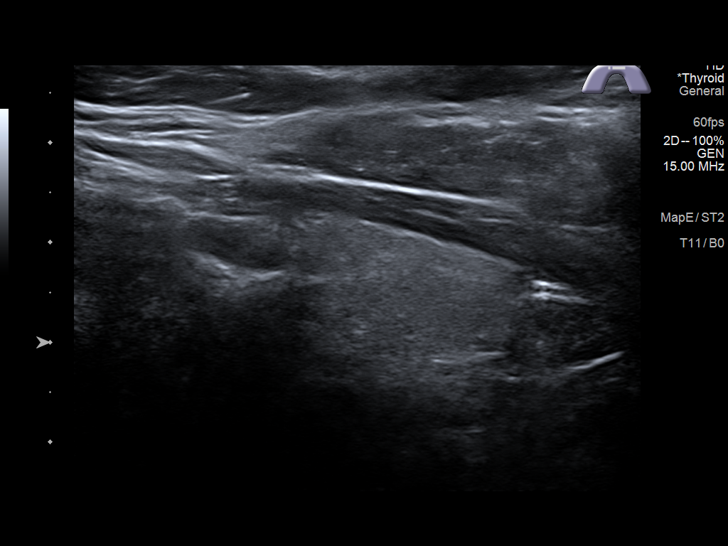
[im 16/41]
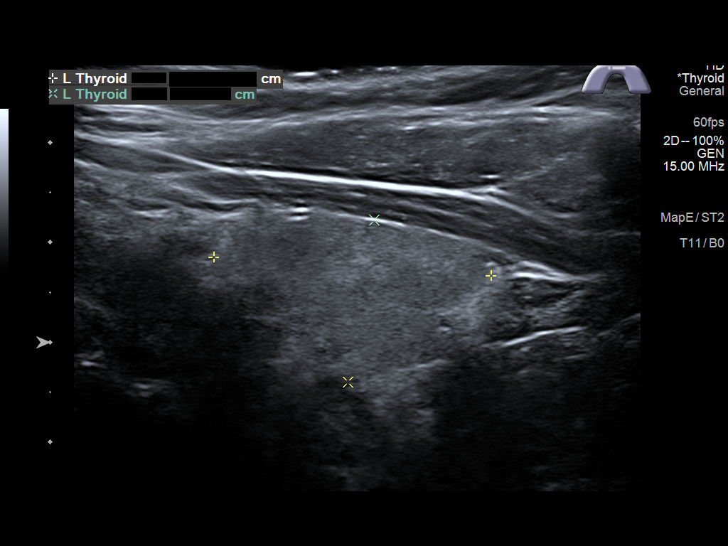
[im 19/41]
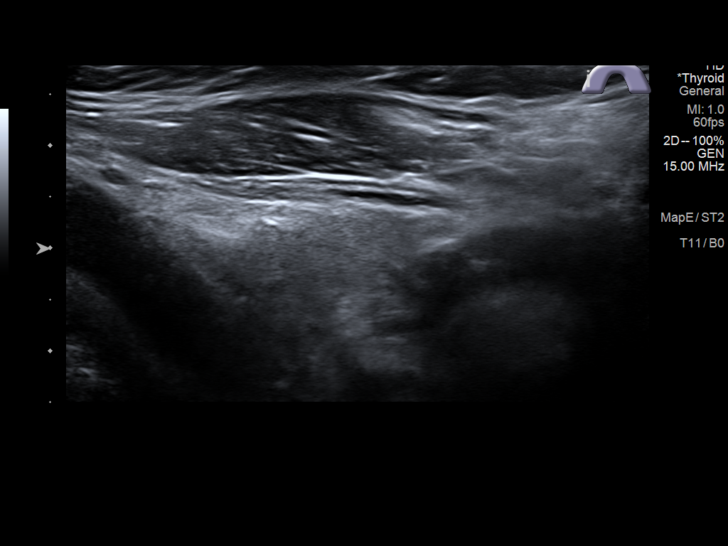
[im 22/41]
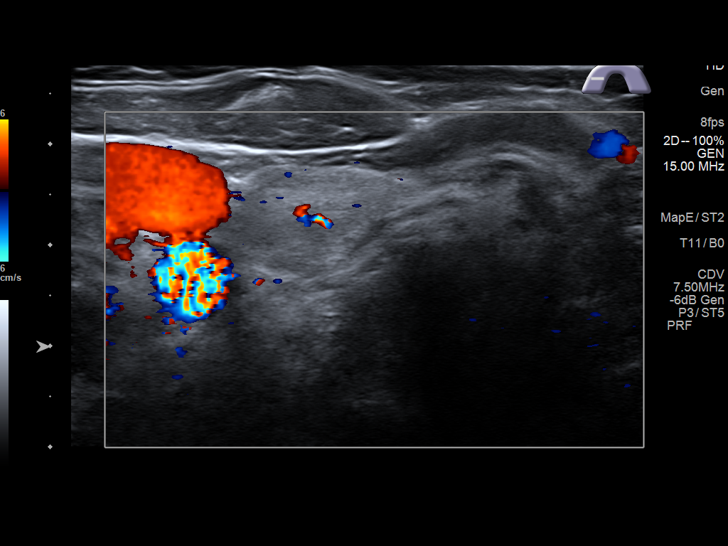
[im 26/41]
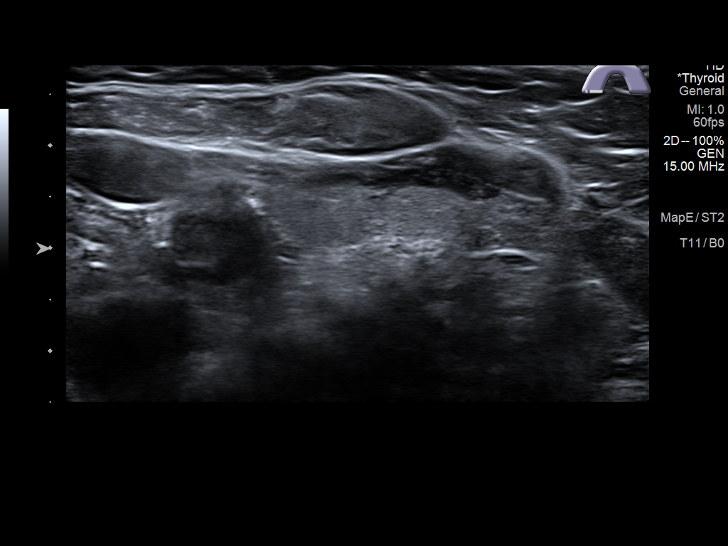
[im 27/41]
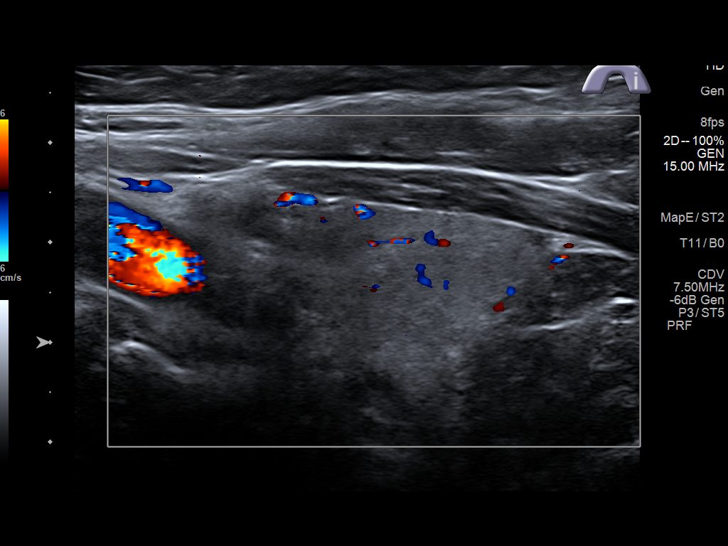
[im 31/41]
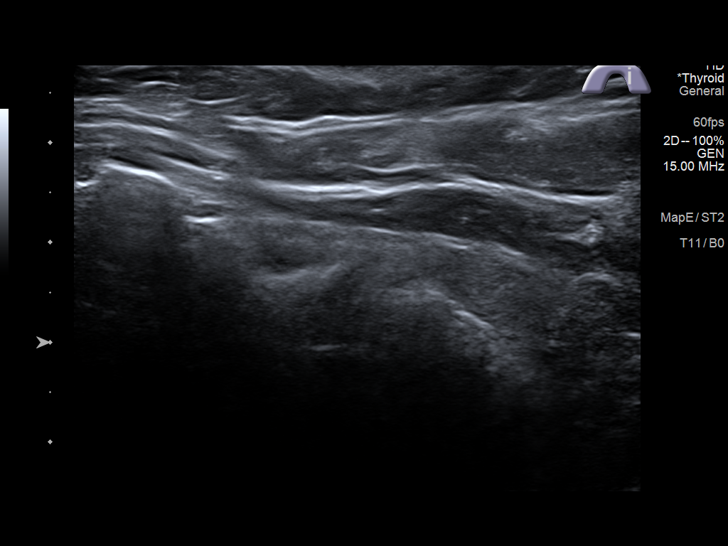
[im 34/41]
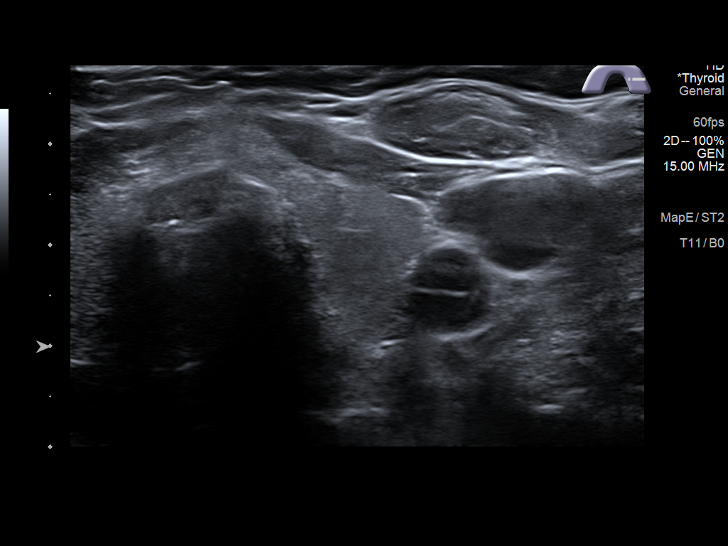
[im 37/41]
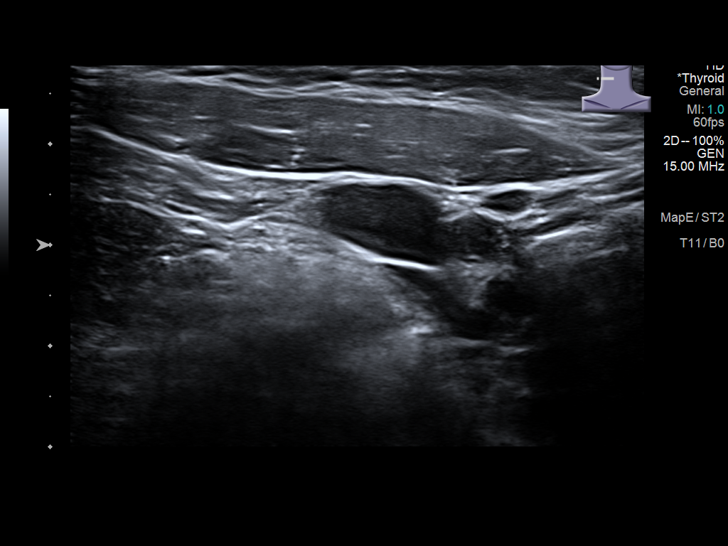
[im 41/41]
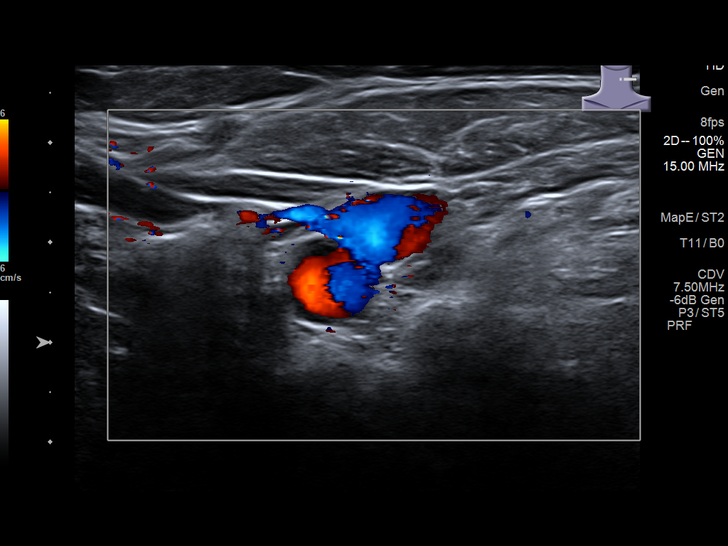

[14 of 25 positions shown; findings below may reference images not displayed]

FINDINGS: Parenchymal Echotexture: Normal

Isthmus: 5 mm

Right lobe: 3.4 x 1.9 x 1.6 cm

Left lobe: 2.8 x 1.6 x 1.2 cm

_________________________________________________________

Estimated total number of nodules >/= 1 cm: 0

Number of spongiform nodules >/=  2 cm not described below (TR1): 0

Number of mixed cystic and solid nodules >/= 1.5 cm not described
below (TR2): 0

_________________________________________________________

No discrete nodules are seen within the thyroid gland.
IMPRESSION: Normal thyroid ultrasound for age

The above is in keeping with the ACR TI-RADS recommendations - [HOSPITAL] 5688;[DATE].

## 2021-05-01 ENCOUNTER — Other Ambulatory Visit: Payer: Self-pay | Admitting: Internal Medicine

## 2021-05-01 DIAGNOSIS — F3176 Bipolar disorder, in full remission, most recent episode depressed: Secondary | ICD-10-CM

## 2021-05-01 DIAGNOSIS — F419 Anxiety disorder, unspecified: Secondary | ICD-10-CM

## 2021-05-05 ENCOUNTER — Telehealth: Payer: Self-pay

## 2021-05-05 NOTE — Telephone Encounter (Signed)
? ?  Telephone encounter was:  Successful.  ?05/05/2021 ?Name: Kerri Carter MRN: [id number] DOB: [birth year] ? ?Kerri Carter is a 81 y.o. year old female who is a primary care patient of McLean-Scocuzza, Nino Glow, MD . The community resource team was consulted for assistance with Transportation Needs  ? ?Care guide performed the following interventions: Spoke with patient about arranging transportation for 05-16-21 appointment at [facility].  Emailed transportation request to [employer]. ? ?Follow Up Plan:   I will follow-up with patient once I get approval from [facility] transportation  ? ?Joeph Szatkowski, [place], CHC ?Care Guide  Embedded Care Coordination ?[facility]  ?[address] ?[place], [place] [address] ?[email or web address]  ?? [phone]   ?[email or web address] ?  ?

## 2021-05-06 ENCOUNTER — Telehealth: Payer: Self-pay

## 2021-05-06 NOTE — Telephone Encounter (Signed)
? ?  Telephone encounter was:  Successful.  ?05/06/2021 ?Name: Kerri Carter MRN: [id number] DOB: 02/13/1940 ? ?Kerri Carter is a 81 y.o. year old female who is a primary care patient of McLean-Scocuzza, Nino Glow, MD . The community resource team was consulted for assistance with Transportation Needs  ? ?Care guide performed the following interventions: Spoke with Shemya at [employer], confirmed request for transportation on 05/16/21 has been approved and they will contact the patient prior to her appointment.   Spoke with patient to let her know she is on [facility]'s schedule and will be called next week. ? ?Follow Up Plan:  Care guide will follow up with patient by phone over the next 7 days ? ?Sneha Willig, [place], CHC ?Care Guide  Embedded Care Coordination ?[facility]  ?[address] ?[place], [place] [address] ?[email or web address]  ?? [phone]   [email or web address] ?  ?

## 2021-05-08 ENCOUNTER — Encounter: Payer: Self-pay | Admitting: Internal Medicine

## 2021-05-08 ENCOUNTER — Ambulatory Visit: Payer: Medicare PPO | Admitting: Podiatry

## 2021-05-08 ENCOUNTER — Other Ambulatory Visit: Payer: Self-pay

## 2021-05-08 ENCOUNTER — Telehealth (INDEPENDENT_AMBULATORY_CARE_PROVIDER_SITE_OTHER): Payer: Medicare PPO | Admitting: Internal Medicine

## 2021-05-08 VITALS — BP 130/78 | Ht 61.0 in | Wt 173.0 lb

## 2021-05-08 DIAGNOSIS — B3731 Acute candidiasis of vulva and vagina: Secondary | ICD-10-CM

## 2021-05-08 DIAGNOSIS — L304 Erythema intertrigo: Secondary | ICD-10-CM

## 2021-05-08 DIAGNOSIS — H1030 Unspecified acute conjunctivitis, unspecified eye: Secondary | ICD-10-CM | POA: Diagnosis not present

## 2021-05-08 DIAGNOSIS — J441 Chronic obstructive pulmonary disease with (acute) exacerbation: Secondary | ICD-10-CM

## 2021-05-08 DIAGNOSIS — R059 Cough, unspecified: Secondary | ICD-10-CM

## 2021-05-08 DIAGNOSIS — J449 Chronic obstructive pulmonary disease, unspecified: Secondary | ICD-10-CM | POA: Diagnosis not present

## 2021-05-08 MED ORDER — ERYTHROMYCIN 5 MG/GM OP OINT: 1.0000 "application " | TOPICAL_OINTMENT | Freq: Three times a day (TID) | OPHTHALMIC | 0 refills | Status: DC

## 2021-05-08 MED ORDER — FLUCONAZOLE 150 MG PO TABS: ORAL_TABLET | ORAL | 0 refills | Status: DC

## 2021-05-08 MED ORDER — AMOXICILLIN-POT CLAVULANATE 875-125 MG PO TABS: 1.0000 | ORAL_TABLET | Freq: Two times a day (BID) | ORAL | 0 refills | Status: DC

## 2021-05-08 NOTE — Progress Notes (Signed)
Virtual Visit via Video Note ? ?I connected with Kerri Carter ? on 05/08/21 at  2:40 PM EDT by a video enabled telemedicine application and verified that I am speaking with the correct person using two identifiers. ? Location patient: [place] ?Location provider:work or home office ?Persons participating in the virtual visit: patient, provider ? ?I discussed the limitations and requested verbal permission for telemedicine visit. The patient expressed understanding and agreed to proceed. ? ? ?HPI: ? ?Acute telemedicine visit for : ?Acute visit h/o asthma/copd and RA lung related changes with cough x 2 weeks clear to yellow/green phelgm at time sob and b/l red eyes left > right eye  ?She is taking prednisone 5 mg qd but increased dose to 10 mg over the last 7-10 days  ?Her grandaughter who she lives had pink  eye 2 weeks ago  ? ?She has back surgery with Dr. [name] sch 05/23/21 ?I have recommended surgical intervention. an open extension of her fusion to L2 with transforaminal lumbar interbody fusion at each level due to ongoing disc disease, disc herniation at L3-4 on the right, and persistent symptoms.  ? ? ?-Pertinent past medical history: see below ?-Pertinent medication allergies: ?Allergies  ?Allergen Reactions  ? Ceftin [Cefuroxime Axetil] Anaphylaxis  ? Lisinopril Anaphylaxis  ? Sulfa Antibiotics Other (See Comments)  ?  Face swelling  ? Sulfasalazine Anaphylaxis  ? Morphine Other (See Comments)  ?  Per patient, low blood pressure issues that requires action to raise it back up. ?Can take small infrequent doses  ? Xarelto [Rivaroxaban] Other (See Comments)  ?  Stomach burning, bleeding, and tar in stool  ? Adhesive [Tape] Rash  ?  Paper tape and tega derm OK  ? Antihistamines, Chlorpheniramine-Type Other (See Comments)  ?  Makes pt hyper  ? Antivert [Meclizine Hcl] Other (See Comments)  ?  Bladder will not empty  ? Aspirin Other (See Comments)  ?  Sulfasalazine allergy cross reacts  ? Contrast Media [Iodinated  Contrast Media] Rash  ?  she is able to use betadine scrubs.  ? Decongestant [Pseudoephedrine Hcl] Other (See Comments)  ?  Makes pt hyper  ? Doxycycline Other (See Comments)  ?  GI upset  ? Levaquin [Levofloxacin In D5w] Rash  ? Polymyxin B Other (See Comments)  ?  Facial rash  ? Tetanus Toxoids Rash and Other (See Comments)  ?  Fever and hot to touch at injection site  ? ?-COVID-19 vaccine status:  ?Immunization History  ?Administered Date(s) Administered  ? Fluad Quad(high Dose 65+) 10/03/2018, 10/16/2019, 11/22/2020  ? Influenza Split 10/04/2012, 10/25/2013, 10/28/2016  ? Influenza Whole 11/03/2011  ? Influenza, High Dose Seasonal PF 10/04/2015, 11/03/2016  ? Influenza,inj,Quad PF,6+ Mos 10/31/2014  ? Influenza-Unspecified 01/02/2010, 11/24/2011, 10/21/2013, 10/31/2014, 09/28/2017  ? Pneumococcal Conjugate-13 10/21/2013  ? Pneumococcal Polysaccharide-23 11/07/2015  ? ? ? ?ROS: See pertinent positives and negatives per HPI. ? ?Past Medical History:  ?Diagnosis Date  ? Anxiety   ? Aortic stenosis 07/09/2015  ? a.) TTE 07/06/2015: EF 55-60%; mild AS with MPG 13.0 mmHg.  ? Asthma   ? Bipolar disorder ([place])   ? C. difficile diarrhea 2010  ? a.) following ABX course during hospital admission  ? Carotid atherosclerosis, bilateral   ? a.) Moderate; < 50% stenosis BILATERAL ICAs.  ? Cataract   ? a.) s/p BILATERAL extraction  ? Chicken pox   ? CKD (chronic kidney disease), stage III ([place])   ? a. s/p R nephrectomy./ aneurysm  ? Conversion disorder   ?  COPD (chronic obstructive pulmonary disease) ([place])   ? Depression   ? Essential hypertension   ? GERD (gastroesophageal reflux disease)   ? Heart murmur   ? Hyperlipidemia   ? ILD (interstitial lung disease) ([place])   ? mild; 2/2 RA diagnosis  ? Inflammatory arthritis   ? a. hands/carpal tunnel.  b. Low titer rheumatoid factor. c. Negative anti-CCP antibodies. d. Plaquenil.  ? Macular degeneration   ? Nocturnal hypoxemia   ? Non-Obstructive CAD   ? a. 07/2009 Cath ([facility]): nonobs  dzs;  b. 03/2011 Cath [facility]): nonobs dzs.  ? Osteoarthritis   ? a. Knees.  ? PAD (peripheral artery disease) ([place])   ? PUD (peptic ulcer disease)   ? S/P right hip fracture   ? 11/01/16 s/p repair  ? Shoulder pain   ? Sleep apnea   ? no cpap / minimal  ? Spinal stenosis at L4-L5 level   ? severe with L4/L5 anterolisthesis grade 1 anterolisthesis   ? Toxic maculopathy   ? Valvular heart disease   ? a.) TTE 07/06/2015: EF 55-60%; Mild MR/AR/TR; Mild AS with MPG 13.0 mmHg.  ? ? ?Past Surgical History:  ?Procedure Laterality Date  ? APPENDECTOMY    ? APPLICATION OF INTRAOPERATIVE CT SCAN N/A 05/23/2021  ? Procedure: APPLICATION OF INTRAOPERATIVE CT SCAN;  Surgeon: Meade Maw, MD;  Location: [facility] ORS;  Service: Neurosurgery;  Laterality: N/A;  ? BACK SURGERY    ? lumbar fusion  ? BUNIONECTOMY Right   ? CATARACT EXTRACTION, BILATERAL    ? CESAREAN SECTION    ? x1  ? CHOLECYSTECTOMY N/A 05/11/2016  ? Procedure: LAPAROSCOPIC CHOLECYSTECTOMY;  Surgeon: Florene Glen, MD;  Location: [facility];  Service: General;  Laterality: N/A;  ? COLONOSCOPY WITH PROPOFOL N/A 04/02/2016  ? Procedure: COLONOSCOPY WITH PROPOFOL;  Surgeon: Jonathon Bellows, MD;  Location: [facility] ENDOSCOPY;  Service: Endoscopy;  Laterality: N/A;  ? ENDOSCOPIC RETROGRADE CHOLANGIOPANCREATOGRAPHY (ERCP) WITH PROPOFOL N/A 05/08/2016  ? Procedure: ENDOSCOPIC RETROGRADE CHOLANGIOPANCREATOGRAPHY (ERCP) WITH PROPOFOL;  Surgeon: Lucilla Lame, MD;  Location: [facility] ENDOSCOPY;  Service: Endoscopy;  Laterality: N/A;  ? ERCP    ? with biliary spincterotomy 05/08/16 Dr. Allen Norris for choledocholithiasis   ? ESOPHAGEAL DILATION  04/02/2016  ? Procedure: ESOPHAGEAL DILATION;  Surgeon: Jonathon Bellows, MD;  Location: [facility] ENDOSCOPY;  Service: Endoscopy;;  ? ESOPHAGOGASTRODUODENOSCOPY (EGD) WITH PROPOFOL N/A 04/02/2016  ? Procedure: ESOPHAGOGASTRODUODENOSCOPY (EGD) WITH PROPOFOL;  Surgeon: Jonathon Bellows, MD;  Location: [facility] ENDOSCOPY;  Service: Endoscopy;  Laterality: N/A;  ? HIP ARTHROPLASTY Right  11/01/2016  ? Procedure: ARTHROPLASTY BIPOLAR HIP (HEMIARTHROPLASTY);  Surgeon: Corky Mull, MD;  Location: [facility] OR[facility];  Service: Orthopedics;  Laterality: Right;  ? LUMBAR LAMINECTOMY/DECOMPRESSION MICRODISCECTOMY N/A 12/11/2020  ? Procedure: LEFT L2-3 MICRODISCECTOMY, L3-4 AND L5-S1 DECOMPRESSION;  Surgeon: Meade Maw, MD;  Location: [facility];  Service: Neurosurgery;  Laterality: N/A;  ? NEPHRECTOMY  1988  ? 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 ORIF SCAPHOID FRACTURE Left 12/21/2019  ? Procedure: OPEN REDUCTION INTERNAL FIXATION (ORIF) OF LEFT SCAPULAR NONUNION WITH BONE GRAFT;  Surgeon: Corky Mull, MD;  Location: [facility];  Service: Orthopedics;  Laterality: Left;  ? osteoporosis    ? noted DEXA 08/19/16   ? REPLACEMENT TOTAL KNEE Right   ? REVERSE SHOULDER ARTHROPLASTY Right 11/04/2017  ? Procedure: REVERSE SHOULDER ARTHROPLASTY;  Surgeon: Corky Mull, MD;  Location: [facility];  Service: Orthopedics;  Laterality: Right;  ? REVERSE SHOULDER ARTHROPLASTY Left 07/26/2018  ?  Procedure: REVERSE SHOULDER ARTHROPLASTY;  Surgeon: Corky Mull, MD;  Location: [facility];  Service: Orthopedics;  Laterality: Left;  ? TONSILLECTOMY    ? TOTAL HIP ARTHROPLASTY  12/10/11  ? [place] left hip  ? TOTAL HIP ARTHROPLASTY Bilateral   ? TRANSFORAMINAL LUMBAR INTERBODY FUSION (TLIF) WITH PEDICLE SCREW FIXATION 3 LEVEL N/A 05/23/2021  ? Procedure: OPEN L2-4 TRANSFORAMINAL LUMBAR INTERBODY FUSION (TLIF) WITH L2-5 POSTERIOR SPINAL FUSION;  Surgeon: Meade Maw, MD;  Location: [facility];  Service: Neurosurgery;  Laterality: N/A;  ? TUBAL LIGATION    ? ? ?No current facility-administered medications for this visit. ?No current outpatient medications on file. ? ?Facility-Administered Medications Ordered in Other Visits:  ?  0.9 %  sodium chloride infusion, 250 mL, Intravenous, Continuous, Loleta Dicker, PA ?  acetaminophen (TYLENOL) tablet 650 mg, 650 mg, Oral, Q4H PRN **OR** acetaminophen  (TYLENOL) suppository 650 mg, 650 mg, Rectal, Q4H PRN, Loleta Dicker, PA ?  acetaminophen (TYLENOL) tablet 1,000 mg, 1,000 mg, Oral, Q6H, Loleta Dicker, PA, 1,000 mg at 05/27/21 1245 ?  alum & mag hydroxide

## 2021-05-10 ENCOUNTER — Other Ambulatory Visit: Payer: Self-pay | Admitting: Pulmonary Disease

## 2021-05-12 ENCOUNTER — Telehealth: Payer: Self-pay | Admitting: Pulmonary Disease

## 2021-05-12 NOTE — Telephone Encounter (Signed)
ATC x1.  No answer and VM full. ?

## 2021-05-13 ENCOUNTER — Encounter
Admission: RE | Admit: 2021-05-13 | Discharge: 2021-05-13 | Disposition: A | Discharge status: Home / Self Care | Payer: Medicare PPO | Source: Ambulatory Visit | Attending: Neurosurgery | Admitting: Neurosurgery

## 2021-05-13 VITALS — HR 107 | Resp 18 | Ht 61.5 in | Wt 173.0 lb

## 2021-05-13 DIAGNOSIS — N1832 Chronic kidney disease, stage 3b: Secondary | ICD-10-CM

## 2021-05-13 DIAGNOSIS — Z01812 Encounter for preprocedural laboratory examination: Secondary | ICD-10-CM | POA: Diagnosis not present

## 2021-05-13 DIAGNOSIS — Z01818 Encounter for other preprocedural examination: Secondary | ICD-10-CM

## 2021-05-13 DIAGNOSIS — R8290 Unspecified abnormal findings in urine: Secondary | ICD-10-CM

## 2021-05-13 LAB — SURGICAL PCR SCREEN
MRSA, PCR: NEGATIVE
Staphylococcus aureus: NEGATIVE

## 2021-05-13 LAB — TYPE AND SCREEN
ABO/RH(D): AB POS
Antibody Screen: NEGATIVE

## 2021-05-13 NOTE — Pre-Procedure Instructions (Signed)
Surgical clearance form sent to Dr. Candiss Norse (nephrology) and Dr. Loyal Buba (cardiology) due to chronic kidney disease, right nephrectomy, HTN, COPD, aortic stenosis. Has appt with cardiology on 05/16/21. Per neurology (Dr. Manuella Ghazi), patient is ok to stop Plavix 7 days prior to surgery and restart 14 days after surgery.  ?

## 2021-05-13 NOTE — Patient Instructions (Signed)
Your procedure is scheduled on: 05/23/21 Report to [place]. To find out your arrival time please call [phone] between 1PM - 3PM on 05/22/21.  Remember: Instructions that are not followed completely may result in serious medical risk, up to and including death, or upon the discretion of your surgeon and anesthesiologist your surgery may need to be rescheduled.     _X__ 1. Do not eat food after midnight the night before your procedure.                 No gum chewing or hard candies. You may drink clear liquids up to 2 hours                 before you are scheduled to arrive for your surgery- DO not drink clear                 liquids within 2 hours of the start of your surgery.                 Clear Liquids include:  water, apple juice without pulp, clear carbohydrate                 drink such as Clearfast or Gatorade, Black Coffee or Tea (Do not add                 anything to coffee or tea). Diabetics water only  __X__2.  On the morning of surgery brush your teeth with toothpaste and water, you                 may rinse your mouth with mouthwash if you wish.  Do not swallow any              toothpaste of mouthwash.     _X__ 3.  No Alcohol for 24 hours before or after surgery.   _X__ 4.  Do Not Smoke or use e-cigarettes For 24 Hours Prior to Your Surgery.                 Do not use any chewable tobacco products for at least 6 hours prior to                 surgery.  ____  5.  Bring all medications with you on the day of surgery if instructed.   __X__  6.  Notify your doctor if there is any change in your medical condition      (cold, fever, infections).     Do not wear jewelry, make-up, hairpins, clips or nail polish. Do not wear lotions, powders, or perfumes.  Do not shave body hair 48 hours prior to surgery. Men may shave face and neck. Do not bring valuables to the hospital.    [facility] is not responsible for any  belongings or valuables.  Contacts, dentures/partials or body piercings may not be worn into surgery. Bring a case for your contacts, glasses or hearing aids, a denture cup will be supplied. Leave your suitcase in the car. After surgery it may be brought to your room. For patients admitted to the hospital, discharge time is determined by your treatment team.   Patients discharged the day of surgery will not be allowed to drive home.   Please read over the following fact sheets that you were given:   MRSA Information, CHG soap  __X__ Take these medicines the morning of surgery with A SIP  OF WATER:    1. amLODipine (NORVASC)   2. dicyclomine (BENTYL) capsule  3. escitalopram (LEXAPRO)  tablet  4. famotidine (PEPCID)  tablet  5. gabapentin (NEURONTIN) capsule  6. lamoTRIgine (LAMICTAL) tablet  7. mirabegron ER (MYRBETRIQ)  tablet  8. montelukast (SINGULAIR)  tablet  9. nebivolol (BYSTOLIC) tablet   ____ Fleet Enema (as directed)   __X__ Use CHG Soap/SAGE wipes as directed  __X__ Use inhalers on the day of surgery  ALSO USE YOUR NEBULIZER THE MORNING OF YOUR SURGERY. PULMICORT AND ALBUTEROL  ____ Stop metformin/Janumet/Farxiga 2 days prior to surgery    ____ Take 1/2 of usual insulin dose the night before surgery. No insulin the morning          of surgery.   ____ Stop Blood Thinners Coumadin/Plavix/Xarelto/Pleta/Pradaxa/Eliquis/Effient/Aspirin  on   Or contact your Surgeon, Cardiologist or Medical Doctor regarding  ability to stop your blood thinners  __X__ Stop Anti-inflammatories 7 days before surgery such as Advil, Ibuprofen, Motrin,  BC or Goodies Powder, Naprosyn, Naproxen, Aleve, Aspirin   STOP LODINE/ETODOLAC 1 WEEK BEFORE SURGERY  __X__ Stop all herbals and supplements, fish oil or vitamins  for 7 days until after surgery. STOP Multivitamin, Vitamin D3   ____ Bring C-Pap to the hospital.    HOLD PLAVIX FOR 7 DAYS BEFORE AND 14 DAYS AFTER YOUR SURGERY  FOLLOW UP  WITH PRIMARY REGARDING RASH UNDER BREASTS IF NO IMPROVEMENT.

## 2021-05-14 ENCOUNTER — Telehealth: Payer: Self-pay

## 2021-05-14 NOTE — Telephone Encounter (Signed)
ATC x2.  No answer, VM full. ? ?According to the Medication list, the prednisone has been refilled:   ? ?predniSONE (DELTASONE) 5 MG tablet  1 ordered       ? Summary: TAKE 1.5 TABLETS (7.5 MG TOTAL) BY MOUTH DAILY WITH BREAKFAST., Normal  ?Start: 05/13/2021 ?Ord/Sold: 05/13/2021 (O) ?Report ?Adh:  ?Long-term:  ?Pharmacy: [id number] [place] [address] ?Med Dose History ?ChangeDiscontinue  ?  ? Patient Sig: TAKE 1.5 TABLETS (7.5 MG TOTAL) BY MOUTH DAILY WITH BREAKFAST.  ?  ? Ordered on: 05/13/2021  ?  ? Authorized by: GVernard GamblesL  ?  ? Dispense: 45 tablet  ?  ? ?

## 2021-05-14 NOTE — Telephone Encounter (Signed)
? ?  Telephone encounter was:  Successful.  ?05/14/2021 ?Name: Kerri Carter MRN: [id number] DOB: 10/18/1940 ? ?Kerri Carter is a 81 y.o. year old female who is a primary care patient of McLean-Scocuzza, Nino Glow, MD . The community resource team was consulted for assistance with Transportation Needs  ? ?Care guide performed the following interventions: Spoke with [name] at [employer] to confirm patient's transportation for 05/16/21. Patient will be called with pick up details before appointment. ? ?Follow Up Plan:  No further follow up planned at this time. The patient has been provided with needed resources. ? ?Herberto Ledwell, [employer], CHC ?Care Guide  Embedded Care Coordination ?  Care Management  ?[address] ?[place], [place] [address] ???[email or web address]  ?? [phone]   ?[email or web address] ?  ?

## 2021-05-15 ENCOUNTER — Ambulatory Visit: Payer: Medicare PPO | Admitting: Podiatry

## 2021-05-16 DIAGNOSIS — Z01810 Encounter for preprocedural cardiovascular examination: Secondary | ICD-10-CM | POA: Diagnosis not present

## 2021-05-16 DIAGNOSIS — I10 Essential (primary) hypertension: Secondary | ICD-10-CM | POA: Diagnosis not present

## 2021-05-16 DIAGNOSIS — I2510 Atherosclerotic heart disease of native coronary artery without angina pectoris: Secondary | ICD-10-CM | POA: Diagnosis not present

## 2021-05-16 NOTE — Telephone Encounter (Signed)
[facility] since I have been unable to reach the patient.  The pharmacy verified that the prednisone was picked up on 05/13/21.  I am closing this encounter according to policy.  Nothing further needed. ?

## 2021-05-19 NOTE — Pre-Procedure Instructions (Signed)
Cardiac clearance on chart from Dr Gordy Clement from 05-19-21 ?

## 2021-05-21 ENCOUNTER — Encounter
Admission: RE | Admit: 2021-05-21 | Discharge: 2021-05-21 | Disposition: A | Discharge status: Home / Self Care | Payer: Medicare PPO | Source: Ambulatory Visit | Attending: Neurosurgery | Admitting: Neurosurgery

## 2021-05-21 DIAGNOSIS — M064 Inflammatory polyarthropathy: Secondary | ICD-10-CM | POA: Diagnosis present

## 2021-05-21 DIAGNOSIS — J189 Pneumonia, unspecified organism: Secondary | ICD-10-CM | POA: Diagnosis not present

## 2021-05-21 DIAGNOSIS — F32A Depression, unspecified: Secondary | ICD-10-CM | POA: Diagnosis not present

## 2021-05-21 DIAGNOSIS — R748 Abnormal levels of other serum enzymes: Secondary | ICD-10-CM | POA: Diagnosis not present

## 2021-05-21 DIAGNOSIS — Z66 Do not resuscitate: Secondary | ICD-10-CM | POA: Diagnosis present

## 2021-05-21 DIAGNOSIS — R0902 Hypoxemia: Secondary | ICD-10-CM | POA: Diagnosis not present

## 2021-05-21 DIAGNOSIS — J9690 Respiratory failure, unspecified, unspecified whether with hypoxia or hypercapnia: Secondary | ICD-10-CM | POA: Diagnosis not present

## 2021-05-21 DIAGNOSIS — Z87891 Personal history of nicotine dependence: Secondary | ICD-10-CM | POA: Diagnosis not present

## 2021-05-21 DIAGNOSIS — R7989 Other specified abnormal findings of blood chemistry: Secondary | ICD-10-CM | POA: Diagnosis not present

## 2021-05-21 DIAGNOSIS — N183 Chronic kidney disease, stage 3 unspecified: Secondary | ICD-10-CM | POA: Diagnosis not present

## 2021-05-21 DIAGNOSIS — J449 Chronic obstructive pulmonary disease, unspecified: Secondary | ICD-10-CM | POA: Diagnosis not present

## 2021-05-21 DIAGNOSIS — M4316 Spondylolisthesis, lumbar region: Secondary | ICD-10-CM | POA: Diagnosis present

## 2021-05-21 DIAGNOSIS — N189 Chronic kidney disease, unspecified: Secondary | ICD-10-CM | POA: Diagnosis not present

## 2021-05-21 DIAGNOSIS — J9601 Acute respiratory failure with hypoxia: Secondary | ICD-10-CM | POA: Diagnosis not present

## 2021-05-21 DIAGNOSIS — F419 Anxiety disorder, unspecified: Secondary | ICD-10-CM | POA: Diagnosis not present

## 2021-05-21 DIAGNOSIS — N179 Acute kidney failure, unspecified: Secondary | ICD-10-CM | POA: Diagnosis not present

## 2021-05-21 DIAGNOSIS — J441 Chronic obstructive pulmonary disease with (acute) exacerbation: Secondary | ICD-10-CM | POA: Diagnosis not present

## 2021-05-21 DIAGNOSIS — Z9981 Dependence on supplemental oxygen: Secondary | ICD-10-CM | POA: Diagnosis not present

## 2021-05-21 DIAGNOSIS — Z981 Arthrodesis status: Secondary | ICD-10-CM | POA: Diagnosis not present

## 2021-05-21 DIAGNOSIS — I340 Nonrheumatic mitral (valve) insufficiency: Secondary | ICD-10-CM | POA: Diagnosis not present

## 2021-05-21 DIAGNOSIS — E785 Hyperlipidemia, unspecified: Secondary | ICD-10-CM | POA: Diagnosis present

## 2021-05-21 DIAGNOSIS — I130 Hypertensive heart and chronic kidney disease with heart failure and stage 1 through stage 4 chronic kidney disease, or unspecified chronic kidney disease: Secondary | ICD-10-CM | POA: Diagnosis present

## 2021-05-21 DIAGNOSIS — Z01818 Encounter for other preprocedural examination: Secondary | ICD-10-CM

## 2021-05-21 DIAGNOSIS — J849 Interstitial pulmonary disease, unspecified: Secondary | ICD-10-CM | POA: Diagnosis present

## 2021-05-21 DIAGNOSIS — R042 Hemoptysis: Secondary | ICD-10-CM | POA: Diagnosis not present

## 2021-05-21 DIAGNOSIS — Z7401 Bed confinement status: Secondary | ICD-10-CM | POA: Diagnosis not present

## 2021-05-21 DIAGNOSIS — I5031 Acute diastolic (congestive) heart failure: Secondary | ICD-10-CM | POA: Diagnosis not present

## 2021-05-21 DIAGNOSIS — F319 Bipolar disorder, unspecified: Secondary | ICD-10-CM | POA: Diagnosis present

## 2021-05-21 DIAGNOSIS — G4733 Obstructive sleep apnea (adult) (pediatric): Secondary | ICD-10-CM | POA: Diagnosis present

## 2021-05-21 DIAGNOSIS — M5136 Other intervertebral disc degeneration, lumbar region: Secondary | ICD-10-CM | POA: Diagnosis not present

## 2021-05-21 DIAGNOSIS — Z6833 Body mass index (BMI) 33.0-33.9, adult: Secondary | ICD-10-CM | POA: Diagnosis not present

## 2021-05-21 DIAGNOSIS — I480 Paroxysmal atrial fibrillation: Secondary | ICD-10-CM | POA: Diagnosis not present

## 2021-05-21 DIAGNOSIS — R5381 Other malaise: Secondary | ICD-10-CM | POA: Diagnosis not present

## 2021-05-21 DIAGNOSIS — J690 Pneumonitis due to inhalation of food and vomit: Secondary | ICD-10-CM | POA: Diagnosis not present

## 2021-05-21 DIAGNOSIS — R531 Weakness: Secondary | ICD-10-CM | POA: Diagnosis not present

## 2021-05-21 DIAGNOSIS — E1165 Type 2 diabetes mellitus with hyperglycemia: Secondary | ICD-10-CM | POA: Diagnosis present

## 2021-05-21 DIAGNOSIS — R918 Other nonspecific abnormal finding of lung field: Secondary | ICD-10-CM | POA: Diagnosis not present

## 2021-05-21 DIAGNOSIS — K589 Irritable bowel syndrome without diarrhea: Secondary | ICD-10-CM | POA: Diagnosis not present

## 2021-05-21 DIAGNOSIS — I2510 Atherosclerotic heart disease of native coronary artery without angina pectoris: Secondary | ICD-10-CM | POA: Diagnosis present

## 2021-05-21 DIAGNOSIS — E1151 Type 2 diabetes mellitus with diabetic peripheral angiopathy without gangrene: Secondary | ICD-10-CM | POA: Diagnosis present

## 2021-05-21 DIAGNOSIS — J9621 Acute and chronic respiratory failure with hypoxia: Secondary | ICD-10-CM | POA: Diagnosis not present

## 2021-05-21 DIAGNOSIS — N1832 Chronic kidney disease, stage 3b: Secondary | ICD-10-CM | POA: Diagnosis present

## 2021-05-21 DIAGNOSIS — J45998 Other asthma: Secondary | ICD-10-CM | POA: Diagnosis not present

## 2021-05-21 DIAGNOSIS — I5043 Acute on chronic combined systolic (congestive) and diastolic (congestive) heart failure: Secondary | ICD-10-CM | POA: Diagnosis not present

## 2021-05-21 DIAGNOSIS — E1122 Type 2 diabetes mellitus with diabetic chronic kidney disease: Secondary | ICD-10-CM | POA: Diagnosis present

## 2021-05-21 DIAGNOSIS — M5116 Intervertebral disc disorders with radiculopathy, lumbar region: Secondary | ICD-10-CM | POA: Diagnosis present

## 2021-05-21 DIAGNOSIS — I5033 Acute on chronic diastolic (congestive) heart failure: Secondary | ICD-10-CM | POA: Diagnosis not present

## 2021-05-21 DIAGNOSIS — I350 Nonrheumatic aortic (valve) stenosis: Secondary | ICD-10-CM | POA: Diagnosis present

## 2021-05-21 DIAGNOSIS — M4326 Fusion of spine, lumbar region: Secondary | ICD-10-CM | POA: Diagnosis not present

## 2021-05-21 DIAGNOSIS — M5416 Radiculopathy, lumbar region: Secondary | ICD-10-CM | POA: Diagnosis not present

## 2021-05-21 DIAGNOSIS — R600 Localized edema: Secondary | ICD-10-CM | POA: Diagnosis not present

## 2021-05-21 DIAGNOSIS — H3530 Unspecified macular degeneration: Secondary | ICD-10-CM | POA: Diagnosis present

## 2021-05-21 DIAGNOSIS — I4891 Unspecified atrial fibrillation: Secondary | ICD-10-CM | POA: Diagnosis not present

## 2021-05-21 IMAGING — US US CAROTID DUPLEX BILAT
1 series · 13 of 24 positions shown · non-contrast
Comparison: 07/04/2009

CLINICAL DATA: TIA

Hypertension
EXAM:
BILATERAL CAROTID DUPLEX ULTRASOUND
TECHNIQUE: Gray scale imaging, color Doppler and duplex ultrasound were
performed of bilateral carotid and vertebral arteries in the neck.

[Series 1: us carotid bilateral · 13 of 67 slices shown]
[im 1/67]
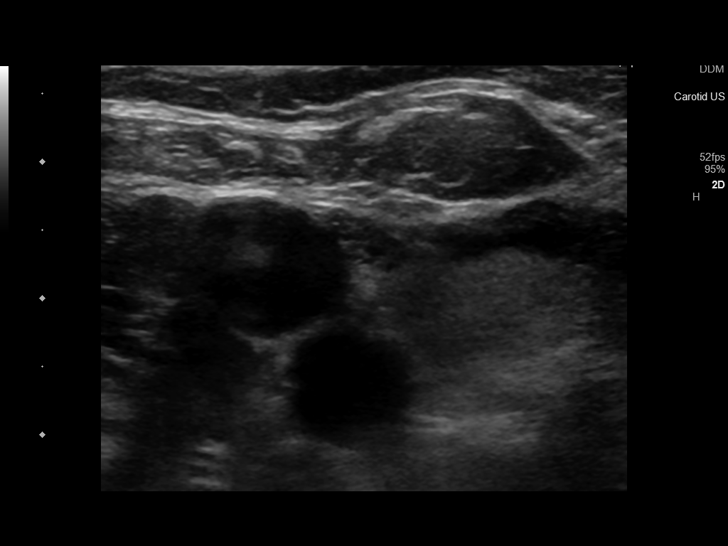
[im 6/67]
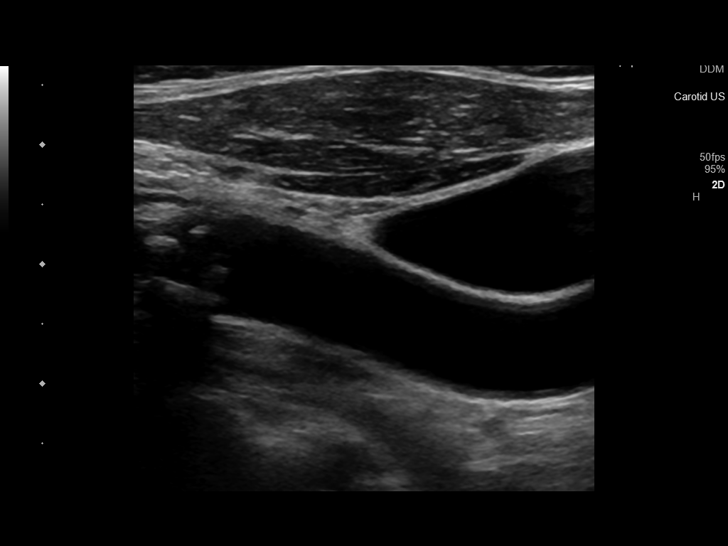
[im 12/67]
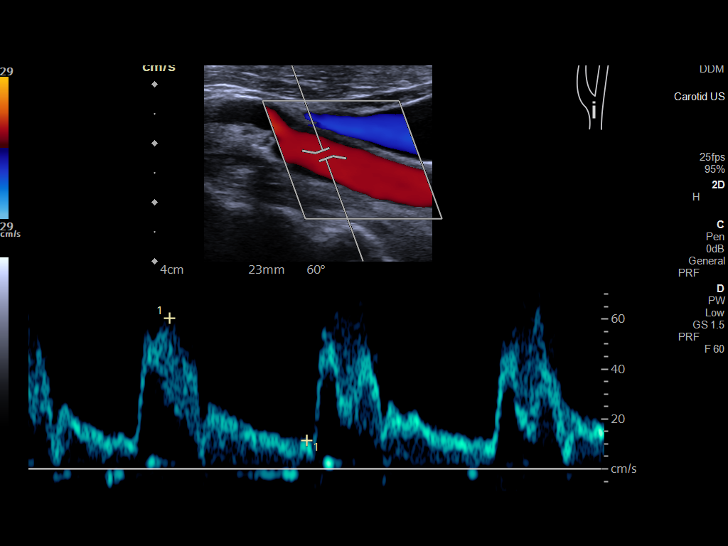
[im 18/67]
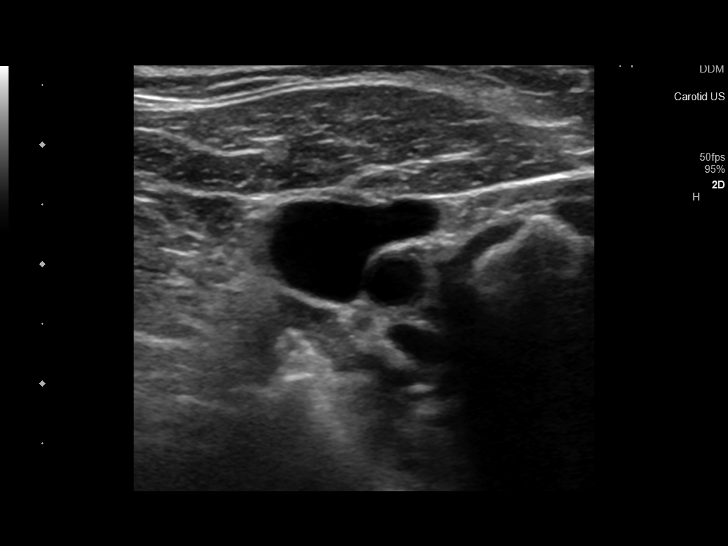
[im 23/67]
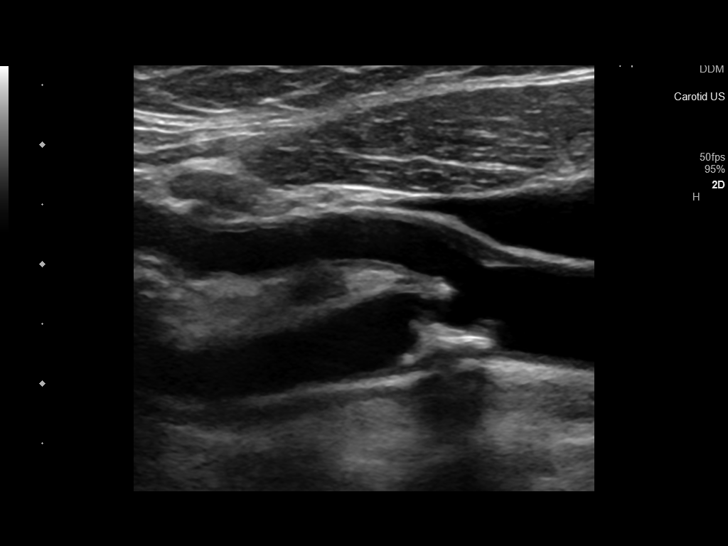
[im 29/67]
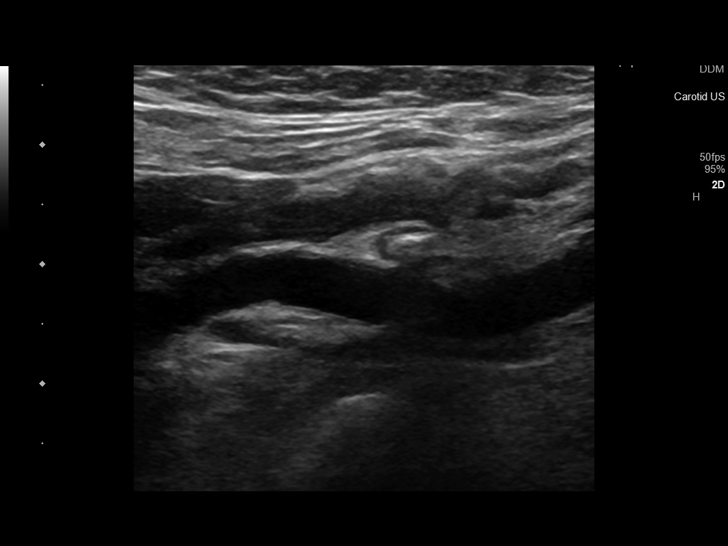
[im 35/67]
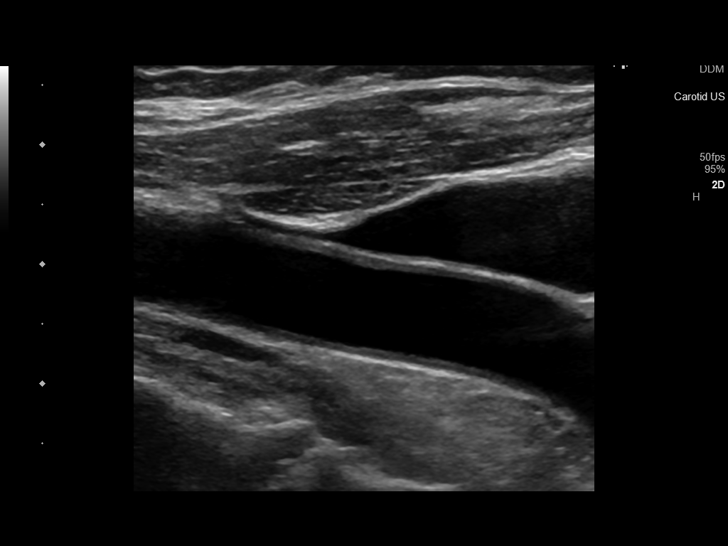
[im 38/67]
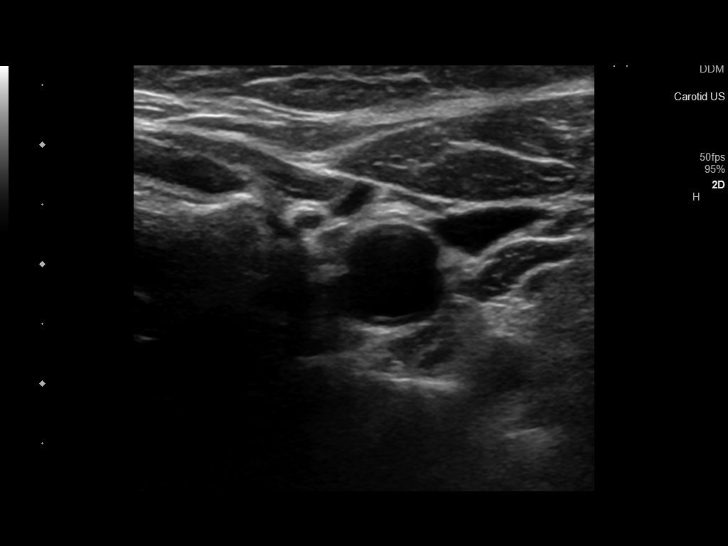
[im 44/67]
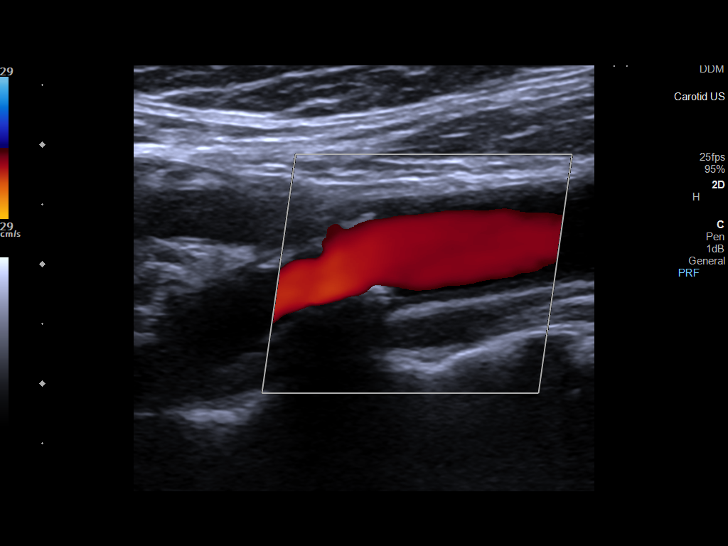
[im 49/67]
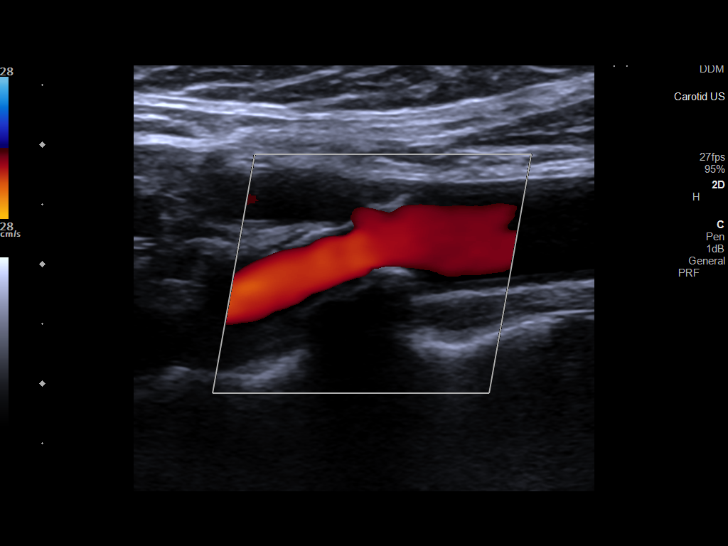
[im 55/67]
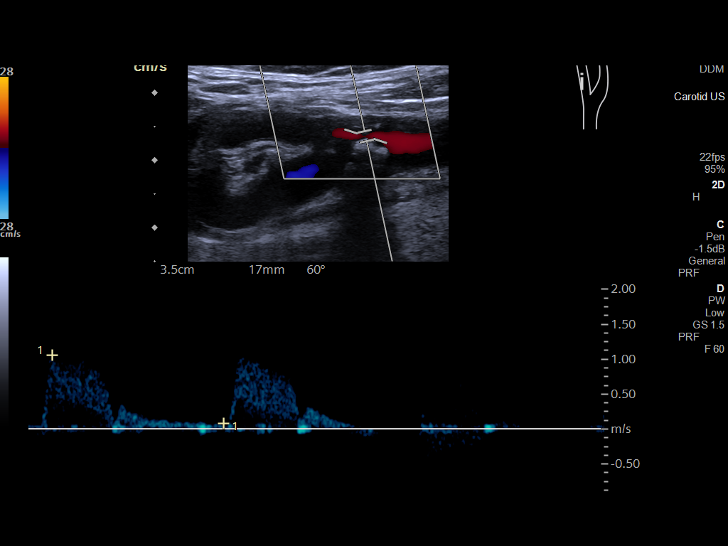
[im 61/67]
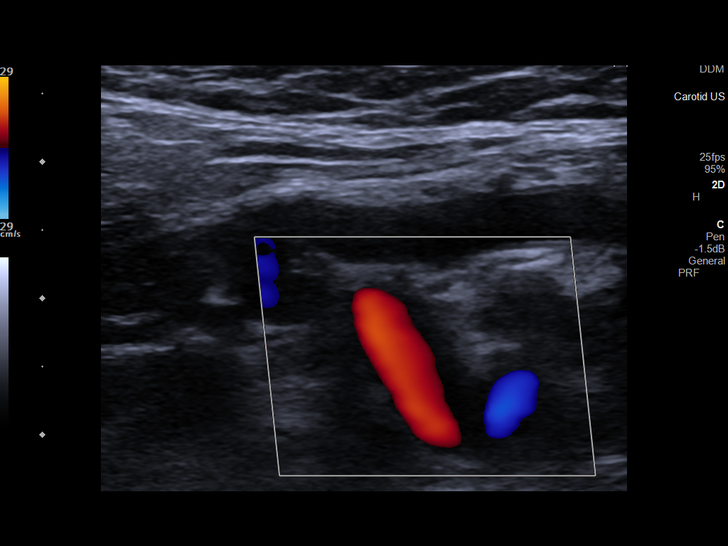
[im 67/67]
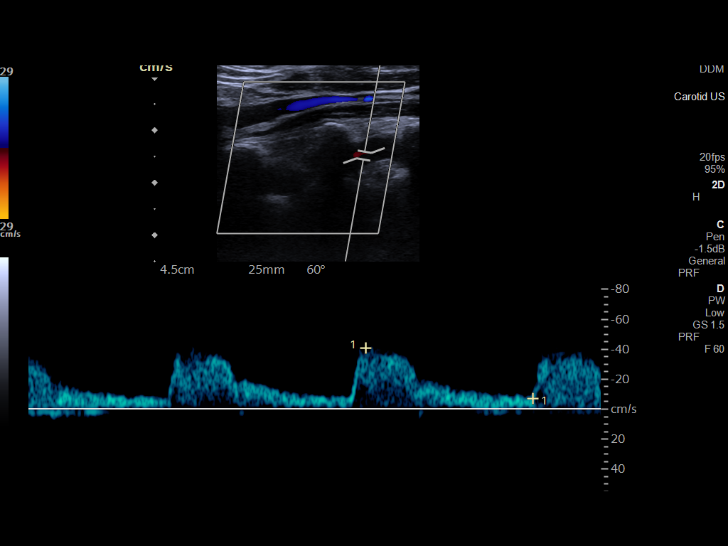

[13 of 24 positions shown; findings below may reference images not displayed]

FINDINGS: Criteria: Quantification of carotid stenosis is based on velocity
parameters that correlate the residual internal carotid diameter
with NASCET-based stenosis levels, using the diameter of the distal
internal carotid lumen as the denominator for stenosis measurement.

The following velocity measurements were obtained:

RIGHT

ICA: 88/21 cm/sec

CCA: 54/9 cm/sec

SYSTOLIC ICA/CCA RATIO:

ECA: 88 cm/sec

LEFT

ICA: 101/24 cm/sec

CCA: 53/9 cm/sec

SYSTOLIC ICA/CCA RATIO:

ECA: 106 cm/sec

RIGHT CAROTID ARTERY: Moderate heterogeneous plaque at the origin of
the right internal carotid artery with velocity parameters
indicating less than 50% stenosis.

RIGHT VERTEBRAL ARTERY:  Antegrade flow.

LEFT CAROTID ARTERY: Moderate heterogeneous plaque at the origin of
the internal carotid artery with velocity parameters indicating less
than 50% stenosis.

LEFT VERTEBRAL ARTERY:  Antegrade flow.
IMPRESSION: Moderate heterogeneous plaque of the internal carotid artery origins
bilaterally with less than 50% stenosis.

## 2021-05-22 LAB — URINALYSIS, COMPLETE (UACMP) WITH MICROSCOPIC
Bilirubin Urine: NEGATIVE
Glucose, UA: NEGATIVE mg/dL
Hgb urine dipstick: NEGATIVE
Ketones, ur: NEGATIVE mg/dL
Leukocytes,Ua: NEGATIVE
Nitrite: NEGATIVE
Protein, ur: NEGATIVE mg/dL
Specific Gravity, Urine: 1.018 (ref 1.005–1.030)
pH: 6 (ref 5.0–8.0)

## 2021-05-22 NOTE — Pre-Procedure Instructions (Signed)
Nephrology clearance on chart from Dr Candiss Norse ?

## 2021-05-23 ENCOUNTER — Inpatient Hospital Stay
Admission: RE | Admit: 2021-05-23 | Discharge: 2021-06-02 | DRG: 453 | Disposition: A | Discharge status: Skilled Nursing Facility | Payer: Medicare PPO | Source: Ambulatory Visit | Attending: Internal Medicine | Admitting: Internal Medicine

## 2021-05-23 ENCOUNTER — Encounter
Admission: RE | Disposition: A | Discharge status: Skilled Nursing Facility | Payer: Self-pay | Source: Ambulatory Visit | Attending: Internal Medicine

## 2021-05-23 ENCOUNTER — Encounter: Payer: Self-pay | Admitting: Neurosurgery

## 2021-05-23 ENCOUNTER — Other Ambulatory Visit: Payer: Self-pay

## 2021-05-23 ENCOUNTER — Inpatient Hospital Stay: Payer: Medicare PPO | Admitting: Anesthesiology

## 2021-05-23 ENCOUNTER — Inpatient Hospital Stay: Payer: Medicare PPO

## 2021-05-23 DIAGNOSIS — I10 Essential (primary) hypertension: Secondary | ICD-10-CM

## 2021-05-23 DIAGNOSIS — Z981 Arthrodesis status: Secondary | ICD-10-CM

## 2021-05-23 DIAGNOSIS — E875 Hyperkalemia: Secondary | ICD-10-CM

## 2021-05-23 DIAGNOSIS — I350 Nonrheumatic aortic (valve) stenosis: Secondary | ICD-10-CM | POA: Diagnosis present

## 2021-05-23 DIAGNOSIS — Z66 Do not resuscitate: Secondary | ICD-10-CM | POA: Diagnosis present

## 2021-05-23 DIAGNOSIS — J9621 Acute and chronic respiratory failure with hypoxia: Secondary | ICD-10-CM | POA: Diagnosis not present

## 2021-05-23 DIAGNOSIS — J849 Interstitial pulmonary disease, unspecified: Secondary | ICD-10-CM | POA: Diagnosis present

## 2021-05-23 DIAGNOSIS — I2510 Atherosclerotic heart disease of native coronary artery without angina pectoris: Secondary | ICD-10-CM | POA: Diagnosis present

## 2021-05-23 DIAGNOSIS — J690 Pneumonitis due to inhalation of food and vomit: Secondary | ICD-10-CM | POA: Diagnosis not present

## 2021-05-23 DIAGNOSIS — Z905 Acquired absence of kidney: Secondary | ICD-10-CM

## 2021-05-23 DIAGNOSIS — Z6833 Body mass index (BMI) 33.0-33.9, adult: Secondary | ICD-10-CM

## 2021-05-23 DIAGNOSIS — Z96612 Presence of left artificial shoulder joint: Secondary | ICD-10-CM | POA: Diagnosis present

## 2021-05-23 DIAGNOSIS — Z01818 Encounter for other preprocedural examination: Principal | ICD-10-CM

## 2021-05-23 DIAGNOSIS — Z9981 Dependence on supplemental oxygen: Secondary | ICD-10-CM

## 2021-05-23 DIAGNOSIS — E1122 Type 2 diabetes mellitus with diabetic chronic kidney disease: Secondary | ICD-10-CM | POA: Diagnosis present

## 2021-05-23 DIAGNOSIS — J189 Pneumonia, unspecified organism: Secondary | ICD-10-CM

## 2021-05-23 DIAGNOSIS — G629 Polyneuropathy, unspecified: Secondary | ICD-10-CM

## 2021-05-23 DIAGNOSIS — J9601 Acute respiratory failure with hypoxia: Secondary | ICD-10-CM | POA: Diagnosis not present

## 2021-05-23 DIAGNOSIS — E1151 Type 2 diabetes mellitus with diabetic peripheral angiopathy without gangrene: Secondary | ICD-10-CM | POA: Diagnosis present

## 2021-05-23 DIAGNOSIS — Z881 Allergy status to other antibiotic agents status: Secondary | ICD-10-CM

## 2021-05-23 DIAGNOSIS — N189 Chronic kidney disease, unspecified: Secondary | ICD-10-CM | POA: Diagnosis not present

## 2021-05-23 DIAGNOSIS — J449 Chronic obstructive pulmonary disease, unspecified: Secondary | ICD-10-CM

## 2021-05-23 DIAGNOSIS — I5043 Acute on chronic combined systolic (congestive) and diastolic (congestive) heart failure: Secondary | ICD-10-CM

## 2021-05-23 DIAGNOSIS — M5116 Intervertebral disc disorders with radiculopathy, lumbar region: Secondary | ICD-10-CM | POA: Diagnosis present

## 2021-05-23 DIAGNOSIS — M4316 Spondylolisthesis, lumbar region: Secondary | ICD-10-CM | POA: Diagnosis present

## 2021-05-23 DIAGNOSIS — F419 Anxiety disorder, unspecified: Secondary | ICD-10-CM | POA: Diagnosis present

## 2021-05-23 DIAGNOSIS — N1832 Chronic kidney disease, stage 3b: Secondary | ICD-10-CM | POA: Diagnosis present

## 2021-05-23 DIAGNOSIS — M064 Inflammatory polyarthropathy: Secondary | ICD-10-CM | POA: Diagnosis present

## 2021-05-23 DIAGNOSIS — I5031 Acute diastolic (congestive) heart failure: Secondary | ICD-10-CM | POA: Diagnosis not present

## 2021-05-23 DIAGNOSIS — N179 Acute kidney failure, unspecified: Secondary | ICD-10-CM | POA: Diagnosis not present

## 2021-05-23 DIAGNOSIS — Z8249 Family history of ischemic heart disease and other diseases of the circulatory system: Secondary | ICD-10-CM

## 2021-05-23 DIAGNOSIS — R042 Hemoptysis: Secondary | ICD-10-CM | POA: Diagnosis not present

## 2021-05-23 DIAGNOSIS — Z9049 Acquired absence of other specified parts of digestive tract: Secondary | ICD-10-CM

## 2021-05-23 DIAGNOSIS — H3530 Unspecified macular degeneration: Secondary | ICD-10-CM | POA: Diagnosis present

## 2021-05-23 DIAGNOSIS — F32A Depression, unspecified: Secondary | ICD-10-CM

## 2021-05-23 DIAGNOSIS — Z91041 Radiographic dye allergy status: Secondary | ICD-10-CM

## 2021-05-23 DIAGNOSIS — Z885 Allergy status to narcotic agent status: Secondary | ICD-10-CM

## 2021-05-23 DIAGNOSIS — J441 Chronic obstructive pulmonary disease with (acute) exacerbation: Secondary | ICD-10-CM | POA: Diagnosis not present

## 2021-05-23 DIAGNOSIS — Z8261 Family history of arthritis: Secondary | ICD-10-CM

## 2021-05-23 DIAGNOSIS — Z888 Allergy status to other drugs, medicaments and biological substances status: Secondary | ICD-10-CM

## 2021-05-23 DIAGNOSIS — G4733 Obstructive sleep apnea (adult) (pediatric): Secondary | ICD-10-CM | POA: Diagnosis present

## 2021-05-23 DIAGNOSIS — E785 Hyperlipidemia, unspecified: Secondary | ICD-10-CM | POA: Diagnosis present

## 2021-05-23 DIAGNOSIS — R7989 Other specified abnormal findings of blood chemistry: Secondary | ICD-10-CM

## 2021-05-23 DIAGNOSIS — I480 Paroxysmal atrial fibrillation: Secondary | ICD-10-CM | POA: Diagnosis not present

## 2021-05-23 DIAGNOSIS — E669 Obesity, unspecified: Secondary | ICD-10-CM | POA: Diagnosis present

## 2021-05-23 DIAGNOSIS — Z91048 Other nonmedicinal substance allergy status: Secondary | ICD-10-CM

## 2021-05-23 DIAGNOSIS — Z96651 Presence of right artificial knee joint: Secondary | ICD-10-CM | POA: Diagnosis present

## 2021-05-23 DIAGNOSIS — Z7952 Long term (current) use of systemic steroids: Secondary | ICD-10-CM

## 2021-05-23 DIAGNOSIS — K219 Gastro-esophageal reflux disease without esophagitis: Secondary | ICD-10-CM | POA: Diagnosis present

## 2021-05-23 DIAGNOSIS — Z96611 Presence of right artificial shoulder joint: Secondary | ICD-10-CM | POA: Diagnosis present

## 2021-05-23 DIAGNOSIS — F319 Bipolar disorder, unspecified: Secondary | ICD-10-CM | POA: Diagnosis present

## 2021-05-23 DIAGNOSIS — R778 Other specified abnormalities of plasma proteins: Secondary | ICD-10-CM | POA: Diagnosis present

## 2021-05-23 DIAGNOSIS — Z886 Allergy status to analgesic agent status: Secondary | ICD-10-CM

## 2021-05-23 DIAGNOSIS — Z96643 Presence of artificial hip joint, bilateral: Secondary | ICD-10-CM | POA: Diagnosis present

## 2021-05-23 DIAGNOSIS — Z87891 Personal history of nicotine dependence: Secondary | ICD-10-CM

## 2021-05-23 DIAGNOSIS — E1165 Type 2 diabetes mellitus with hyperglycemia: Secondary | ICD-10-CM | POA: Diagnosis present

## 2021-05-23 DIAGNOSIS — M4326 Fusion of spine, lumbar region: Secondary | ICD-10-CM | POA: Diagnosis not present

## 2021-05-23 DIAGNOSIS — Z7902 Long term (current) use of antithrombotics/antiplatelets: Secondary | ICD-10-CM

## 2021-05-23 DIAGNOSIS — Z882 Allergy status to sulfonamides status: Secondary | ICD-10-CM

## 2021-05-23 DIAGNOSIS — I130 Hypertensive heart and chronic kidney disease with heart failure and stage 1 through stage 4 chronic kidney disease, or unspecified chronic kidney disease: Secondary | ICD-10-CM | POA: Diagnosis present

## 2021-05-23 DIAGNOSIS — Z7951 Long term (current) use of inhaled steroids: Secondary | ICD-10-CM

## 2021-05-23 DIAGNOSIS — Z79899 Other long term (current) drug therapy: Secondary | ICD-10-CM

## 2021-05-23 HISTORY — PX: TRANSFORAMINAL LUMBAR INTERBODY FUSION (TLIF) WITH PEDICLE SCREW FIXATION 3 LEVEL: SHX6143

## 2021-05-23 HISTORY — DX: Arthrodesis status: Z98.1

## 2021-05-23 HISTORY — PX: APPLICATION OF INTRAOPERATIVE CT SCAN: SHX6668

## 2021-05-23 LAB — URINE CULTURE

## 2021-05-23 LAB — CBC
HCT: 32.3 % — ABNORMAL LOW (ref 36.0–46.0)
Hemoglobin: 9.9 g/dL — ABNORMAL LOW (ref 12.0–15.0)
MCH: 26.8 pg (ref 26.0–34.0)
MCHC: 30.7 g/dL (ref 30.0–36.0)
MCV: 87.3 fL (ref 80.0–100.0)
Platelets: 259 10*3/uL (ref 150–400)
RBC: 3.7 MIL/uL — ABNORMAL LOW (ref 3.87–5.11)
RDW: 14.8 % (ref 11.5–15.5)
WBC: 15.8 10*3/uL — ABNORMAL HIGH (ref 4.0–10.5)
nRBC: 0 % (ref 0.0–0.2)

## 2021-05-23 LAB — CREATININE, SERUM
Creatinine, Ser: 1.25 mg/dL — ABNORMAL HIGH (ref 0.44–1.00)
GFR, Estimated: 44 mL/min — ABNORMAL LOW (ref >60)

## 2021-05-23 SURGERY — TRANSFORAMINAL LUMBAR INTERBODY FUSION (TLIF) WITH PEDICLE SCREW FIXATION 3 LEVEL
Anesthesia: General | Site: Spine Lumbar

## 2021-05-23 MED ORDER — METHOCARBAMOL 500 MG PO TABS
500.0000 mg | ORAL_TABLET | Freq: Four times a day (QID) | ORAL | Status: DC
  Administered 2021-05-23 – 2021-06-02 (×34): 500 mg via ORAL
  Filled 2021-05-23 (×36): qty 1

## 2021-05-23 MED ORDER — DEXAMETHASONE SODIUM PHOSPHATE 10 MG/ML IJ SOLN
INTRAMUSCULAR | Status: DC | PRN
  Administered 2021-05-23: 5 mg via INTRAVENOUS

## 2021-05-23 MED ORDER — VANCOMYCIN HCL IN DEXTROSE 1-5 GM/200ML-% IV SOLN
INTRAVENOUS | Status: AC
  Filled 2021-05-23: qty 200

## 2021-05-23 MED ORDER — CEFAZOLIN SODIUM-DEXTROSE 2-4 GM/100ML-% IV SOLN
2.0000 g | INTRAVENOUS | Status: AC
  Administered 2021-05-23: 2 g via INTRAVENOUS

## 2021-05-23 MED ORDER — VANCOMYCIN HCL IN DEXTROSE 1-5 GM/200ML-% IV SOLN
1000.0000 mg | INTRAVENOUS | Status: AC
Start: 2021-05-23 — End: 2021-05-23
  Administered 2021-05-23: 1000 mg via INTRAVENOUS

## 2021-05-23 MED ORDER — BENZONATATE 100 MG PO CAPS
200.0000 mg | ORAL_CAPSULE | Freq: Three times a day (TID) | ORAL | Status: DC | PRN
  Administered 2021-05-28: 200 mg via ORAL
  Filled 2021-05-23: qty 2

## 2021-05-23 MED ORDER — SODIUM CHLORIDE (PF) 0.9 % IJ SOLN
INTRAMUSCULAR | Status: DC | PRN
  Administered 2021-05-23: 60 mL via INTRAMUSCULAR

## 2021-05-23 MED ORDER — DEXAMETHASONE SODIUM PHOSPHATE 10 MG/ML IJ SOLN
INTRAMUSCULAR | Status: AC
  Filled 2021-05-23: qty 1

## 2021-05-23 MED ORDER — OCUVITE-LUTEIN PO CAPS
1.0000 | ORAL_CAPSULE | Freq: Every day | ORAL | Status: DC
  Administered 2021-05-24 – 2021-06-02 (×8): 1 via ORAL
  Filled 2021-05-23 (×10): qty 1

## 2021-05-23 MED ORDER — BUPIVACAINE HCL (PF) 0.5 % IJ SOLN
INTRAMUSCULAR | Status: AC
  Filled 2021-05-23: qty 30

## 2021-05-23 MED ORDER — SODIUM CHLORIDE 0.9% FLUSH: 3.0000 mL | INTRAVENOUS | Status: DC | PRN

## 2021-05-23 MED ORDER — SODIUM CHLORIDE 0.9% FLUSH
3.0000 mL | Freq: Two times a day (BID) | INTRAVENOUS | Status: DC
  Administered 2021-05-24 – 2021-06-02 (×18): 3 mL via INTRAVENOUS

## 2021-05-23 MED ORDER — METOPROLOL TARTRATE 5 MG/5ML IV SOLN
INTRAVENOUS | Status: DC | PRN
  Administered 2021-05-23: 2 mg via INTRAVENOUS

## 2021-05-23 MED ORDER — BISACODYL 10 MG RE SUPP
10.0000 mg | Freq: Every day | RECTAL | Status: DC | PRN
Start: 2021-05-23 — End: 2021-06-02

## 2021-05-23 MED ORDER — LIDOCAINE HCL (CARDIAC) PF 100 MG/5ML IV SOSY
PREFILLED_SYRINGE | INTRAVENOUS | Status: DC | PRN
  Administered 2021-05-23: 100 mg via INTRAVENOUS

## 2021-05-23 MED ORDER — FENTANYL CITRATE (PF) 100 MCG/2ML IJ SOLN
INTRAMUSCULAR | Status: AC
  Administered 2021-05-23: 25 ug via INTRAVENOUS
  Filled 2021-05-23: qty 2

## 2021-05-23 MED ORDER — FENTANYL CITRATE (PF) 100 MCG/2ML IJ SOLN
25.0000 ug | INTRAMUSCULAR | Status: DC | PRN
  Administered 2021-05-23 (×3): 25 ug via INTRAVENOUS

## 2021-05-23 MED ORDER — ACETAMINOPHEN 650 MG RE SUPP: 650.0000 mg | RECTAL | Status: DC | PRN

## 2021-05-23 MED ORDER — FLEET ENEMA 7-19 GM/118ML RE ENEM: 1.0000 | ENEMA | Freq: Once | RECTAL | Status: DC | PRN

## 2021-05-23 MED ORDER — SUCCINYLCHOLINE CHLORIDE 200 MG/10ML IV SOSY
PREFILLED_SYRINGE | INTRAVENOUS | Status: AC
  Filled 2021-05-23: qty 10

## 2021-05-23 MED ORDER — ONDANSETRON HCL 4 MG/2ML IJ SOLN: 4.0000 mg | Freq: Once | INTRAMUSCULAR | Status: DC | PRN

## 2021-05-23 MED ORDER — METOPROLOL TARTRATE 5 MG/5ML IV SOLN
INTRAVENOUS | Status: AC
  Filled 2021-05-23: qty 5

## 2021-05-23 MED ORDER — SUCCINYLCHOLINE CHLORIDE 200 MG/10ML IV SOSY
PREFILLED_SYRINGE | INTRAVENOUS | Status: DC | PRN
  Administered 2021-05-23: 100 mg via INTRAVENOUS

## 2021-05-23 MED ORDER — NEBIVOLOL HCL 5 MG PO TABS
5.0000 mg | ORAL_TABLET | Freq: Every day | ORAL | Status: DC
Start: 2021-05-24 — End: 2021-06-02
  Administered 2021-05-24 – 2021-06-02 (×9): 5 mg via ORAL
  Filled 2021-05-23 (×10): qty 1

## 2021-05-23 MED ORDER — MIRABEGRON ER 50 MG PO TB24
50.0000 mg | ORAL_TABLET | Freq: Every day | ORAL | Status: DC
  Administered 2021-05-24 – 2021-05-27 (×4): 50 mg via ORAL
  Filled 2021-05-23 (×4): qty 1

## 2021-05-23 MED ORDER — VANCOMYCIN HCL 1000 MG IV SOLR
INTRAVENOUS | Status: DC | PRN
  Administered 2021-05-23: 1000 mg via TOPICAL

## 2021-05-23 MED ORDER — BUDESONIDE 0.25 MG/2ML IN SUSP
0.2500 mg | Freq: Two times a day (BID) | RESPIRATORY_TRACT | Status: DC
  Administered 2021-05-24 – 2021-06-02 (×18): 0.25 mg via RESPIRATORY_TRACT
  Filled 2021-05-23 (×18): qty 2

## 2021-05-23 MED ORDER — LIDOCAINE 5 % EX PTCH
1.0000 | MEDICATED_PATCH | Freq: Every day | CUTANEOUS | Status: DC
  Administered 2021-05-24 – 2021-06-02 (×10): 1 via TRANSDERMAL
  Filled 2021-05-23 (×10): qty 1

## 2021-05-23 MED ORDER — FAMOTIDINE 20 MG PO TABS
20.0000 mg | ORAL_TABLET | Freq: Every day | ORAL | Status: DC
  Administered 2021-05-24 – 2021-06-02 (×9): 20 mg via ORAL
  Filled 2021-05-23 (×9): qty 1

## 2021-05-23 MED ORDER — MONTELUKAST SODIUM 10 MG PO TABS
10.0000 mg | ORAL_TABLET | Freq: Every day | ORAL | Status: DC
  Administered 2021-05-24 – 2021-06-02 (×9): 10 mg via ORAL
  Filled 2021-05-23 (×9): qty 1

## 2021-05-23 MED ORDER — METHOCARBAMOL 1000 MG/10ML IJ SOLN
500.0000 mg | Freq: Four times a day (QID) | INTRAVENOUS | Status: DC
  Administered 2021-05-27 – 2021-05-28 (×2): 500 mg via INTRAVENOUS
  Filled 2021-05-23: qty 5
  Filled 2021-05-23 (×2): qty 500

## 2021-05-23 MED ORDER — PREDNISONE 2.5 MG PO TABS
7.5000 mg | ORAL_TABLET | Freq: Every day | ORAL | Status: DC
  Administered 2021-05-24 – 2021-05-26 (×3): 7.5 mg via ORAL
  Filled 2021-05-23 (×3): qty 1

## 2021-05-23 MED ORDER — IPRATROPIUM-ALBUTEROL 0.5-2.5 (3) MG/3ML IN SOLN
3.0000 mL | Freq: Two times a day (BID) | RESPIRATORY_TRACT | Status: DC
  Administered 2021-05-23 – 2021-06-02 (×19): 3 mL via RESPIRATORY_TRACT
  Filled 2021-05-23 (×20): qty 3

## 2021-05-23 MED ORDER — MENTHOL 3 MG MT LOZG
1.0000 | LOZENGE | OROMUCOSAL | Status: DC | PRN
  Filled 2021-05-23: qty 9

## 2021-05-23 MED ORDER — PHENYLEPHRINE HCL-NACL 20-0.9 MG/250ML-% IV SOLN
INTRAVENOUS | Status: DC | PRN
  Administered 2021-05-23: 50 ug/min via INTRAVENOUS

## 2021-05-23 MED ORDER — SODIUM CHLORIDE 0.9 % IV SOLN: 250.0000 mL | INTRAVENOUS | Status: DC

## 2021-05-23 MED ORDER — BUPIVACAINE-EPINEPHRINE (PF) 0.5% -1:200000 IJ SOLN
INTRAMUSCULAR | Status: AC
  Filled 2021-05-23: qty 30

## 2021-05-23 MED ORDER — DOCUSATE SODIUM 100 MG PO CAPS
100.0000 mg | ORAL_CAPSULE | Freq: Two times a day (BID) | ORAL | Status: DC
  Administered 2021-05-24 – 2021-06-01 (×13): 100 mg via ORAL
  Filled 2021-05-23 (×17): qty 1

## 2021-05-23 MED ORDER — BUPIVACAINE-EPINEPHRINE 0.5% -1:200000 IJ SOLN
INTRAMUSCULAR | Status: DC | PRN
  Administered 2021-05-23: 10 mL

## 2021-05-23 MED ORDER — ONDANSETRON HCL 4 MG PO TABS: 4.0000 mg | ORAL_TABLET | Freq: Four times a day (QID) | ORAL | Status: DC | PRN

## 2021-05-23 MED ORDER — VITAMIN D 25 MCG (1000 UNIT) PO TABS
4000.0000 [IU] | ORAL_TABLET | Freq: Every day | ORAL | Status: DC
  Administered 2021-05-24 – 2021-06-02 (×9): 4000 [IU] via ORAL
  Filled 2021-05-23 (×9): qty 4

## 2021-05-23 MED ORDER — SODIUM CHLORIDE 0.9 % IV SOLN: INTRAVENOUS | Status: DC

## 2021-05-23 MED ORDER — CHLORHEXIDINE GLUCONATE 0.12 % MT SOLN: 15.0000 mL | Freq: Once | OROMUCOSAL | Status: AC

## 2021-05-23 MED ORDER — ESCITALOPRAM OXALATE 10 MG PO TABS
10.0000 mg | ORAL_TABLET | Freq: Every day | ORAL | Status: DC
  Administered 2021-05-24 – 2021-06-02 (×10): 10 mg via ORAL
  Filled 2021-05-23 (×11): qty 1

## 2021-05-23 MED ORDER — SURGIFLO WITH THROMBIN (HEMOSTATIC MATRIX KIT) OPTIME
TOPICAL | Status: DC | PRN
  Administered 2021-05-23: 1 via TOPICAL

## 2021-05-23 MED ORDER — OXYCODONE HCL 5 MG PO TABS
10.0000 mg | ORAL_TABLET | ORAL | Status: DC | PRN
  Administered 2021-05-23 – 2021-05-29 (×8): 10 mg via ORAL
  Filled 2021-05-23 (×8): qty 2

## 2021-05-23 MED ORDER — ONDANSETRON HCL 4 MG/2ML IJ SOLN
INTRAMUSCULAR | Status: AC
  Administered 2021-05-23: 4 mg
  Filled 2021-05-23: qty 2

## 2021-05-23 MED ORDER — HYDROMORPHONE HCL 1 MG/ML IJ SOLN: 1.0000 mg | INTRAMUSCULAR | Status: DC | PRN

## 2021-05-23 MED ORDER — CEFAZOLIN SODIUM-DEXTROSE 2-4 GM/100ML-% IV SOLN
INTRAVENOUS | Status: AC
  Filled 2021-05-23: qty 100

## 2021-05-23 MED ORDER — SODIUM CHLORIDE FLUSH 0.9 % IV SOLN
INTRAVENOUS | Status: AC
  Filled 2021-05-23: qty 20

## 2021-05-23 MED ORDER — ORAL CARE MOUTH RINSE: 15.0000 mL | Freq: Once | OROMUCOSAL | Status: AC

## 2021-05-23 MED ORDER — ACETAMINOPHEN 10 MG/ML IV SOLN
INTRAVENOUS | Status: AC
  Filled 2021-05-23: qty 100

## 2021-05-23 MED ORDER — CHLORHEXIDINE GLUCONATE 0.12 % MT SOLN
OROMUCOSAL | Status: AC
  Administered 2021-05-23: 15 mL via OROMUCOSAL
  Filled 2021-05-23: qty 15

## 2021-05-23 MED ORDER — ALBUTEROL SULFATE (2.5 MG/3ML) 0.083% IN NEBU
2.5000 mg | INHALATION_SOLUTION | Freq: Four times a day (QID) | RESPIRATORY_TRACT | Status: DC | PRN
  Filled 2021-05-23: qty 3

## 2021-05-23 MED ORDER — PHENYLEPHRINE 80 MCG/ML (10ML) SYRINGE FOR IV PUSH (FOR BLOOD PRESSURE SUPPORT)
PREFILLED_SYRINGE | INTRAVENOUS | Status: AC
Start: 2021-05-23 — End: ?
  Filled 2021-05-23: qty 10

## 2021-05-23 MED ORDER — PROPOFOL 10 MG/ML IV BOLUS
INTRAVENOUS | Status: AC
  Filled 2021-05-23: qty 20

## 2021-05-23 MED ORDER — ACETAMINOPHEN 10 MG/ML IV SOLN
INTRAVENOUS | Status: DC | PRN
  Administered 2021-05-23: 1000 mg via INTRAVENOUS

## 2021-05-23 MED ORDER — OXYCODONE HCL 5 MG PO TABS
5.0000 mg | ORAL_TABLET | ORAL | Status: DC | PRN
  Administered 2021-05-24: 5 mg via ORAL
  Filled 2021-05-23: qty 1

## 2021-05-23 MED ORDER — SENNA 8.6 MG PO TABS
1.0000 | ORAL_TABLET | Freq: Two times a day (BID) | ORAL | Status: DC
  Administered 2021-05-25 – 2021-06-01 (×10): 8.6 mg via ORAL
  Filled 2021-05-23 (×18): qty 1

## 2021-05-23 MED ORDER — ONDANSETRON HCL 4 MG/2ML IJ SOLN: 4.0000 mg | Freq: Four times a day (QID) | INTRAMUSCULAR | Status: DC | PRN

## 2021-05-23 MED ORDER — LACTATED RINGERS IV SOLN: INTRAVENOUS | Status: DC

## 2021-05-23 MED ORDER — PHENOL 1.4 % MT LIQD
1.0000 | OROMUCOSAL | Status: DC | PRN
  Filled 2021-05-23: qty 177

## 2021-05-23 MED ORDER — PHENYLEPHRINE 80 MCG/ML (10ML) SYRINGE FOR IV PUSH (FOR BLOOD PRESSURE SUPPORT)
PREFILLED_SYRINGE | INTRAVENOUS | Status: DC | PRN
Start: 2021-05-23 — End: 2021-05-23
  Administered 2021-05-23 (×4): 80 ug via INTRAVENOUS
  Administered 2021-05-23: 160 ug via INTRAVENOUS

## 2021-05-23 MED ORDER — GABAPENTIN 300 MG PO CAPS
600.0000 mg | ORAL_CAPSULE | Freq: Two times a day (BID) | ORAL | Status: DC
  Administered 2021-05-23 – 2021-06-02 (×20): 600 mg via ORAL
  Filled 2021-05-23 (×20): qty 2

## 2021-05-23 MED ORDER — QUETIAPINE FUMARATE 25 MG PO TABS
25.0000 mg | ORAL_TABLET | Freq: Every day | ORAL | Status: DC
  Administered 2021-05-23 – 2021-06-01 (×10): 25 mg via ORAL
  Filled 2021-05-23 (×10): qty 1

## 2021-05-23 MED ORDER — FLUTICASONE PROPIONATE 50 MCG/ACT NA SUSP
2.0000 | Freq: Every day | NASAL | Status: DC
  Administered 2021-05-24 – 2021-06-02 (×9): 2 via NASAL
  Filled 2021-05-23 (×2): qty 16

## 2021-05-23 MED ORDER — FENTANYL CITRATE (PF) 100 MCG/2ML IJ SOLN
INTRAMUSCULAR | Status: DC | PRN
  Administered 2021-05-23 (×2): 50 ug via INTRAVENOUS

## 2021-05-23 MED ORDER — LEFLUNOMIDE 20 MG PO TABS
20.0000 mg | ORAL_TABLET | Freq: Every day | ORAL | Status: DC
  Administered 2021-05-24 – 2021-06-02 (×10): 20 mg via ORAL
  Filled 2021-05-23 (×12): qty 1

## 2021-05-23 MED ORDER — ONDANSETRON HCL 4 MG/2ML IJ SOLN
INTRAMUSCULAR | Status: DC | PRN
  Administered 2021-05-23: 4 mg via INTRAVENOUS

## 2021-05-23 MED ORDER — 0.9 % SODIUM CHLORIDE (POUR BTL) OPTIME
TOPICAL | Status: DC | PRN
  Administered 2021-05-23: 1000 mL

## 2021-05-23 MED ORDER — ONDANSETRON HCL 4 MG/2ML IJ SOLN
INTRAMUSCULAR | Status: AC
  Filled 2021-05-23: qty 2

## 2021-05-23 MED ORDER — FENTANYL CITRATE (PF) 100 MCG/2ML IJ SOLN
INTRAMUSCULAR | Status: AC
Start: 2021-05-23 — End: ?
  Filled 2021-05-23: qty 2

## 2021-05-23 MED ORDER — VANCOMYCIN HCL 1000 MG IV SOLR
INTRAVENOUS | Status: AC
  Filled 2021-05-23: qty 20

## 2021-05-23 MED ORDER — DICYCLOMINE HCL 10 MG PO CAPS
20.0000 mg | ORAL_CAPSULE | Freq: Two times a day (BID) | ORAL | Status: DC
  Administered 2021-05-23 – 2021-06-02 (×20): 20 mg via ORAL
  Filled 2021-05-23 (×20): qty 2

## 2021-05-23 MED ORDER — POLYETHYLENE GLYCOL 3350 17 G PO PACK: 17.0000 g | PACK | Freq: Every day | ORAL | Status: DC | PRN

## 2021-05-23 MED ORDER — ACETAMINOPHEN 500 MG PO TABS
1000.0000 mg | ORAL_TABLET | Freq: Four times a day (QID) | ORAL | Status: DC
  Administered 2021-05-23 – 2021-06-02 (×29): 1000 mg via ORAL
  Filled 2021-05-23 (×29): qty 2

## 2021-05-23 MED ORDER — ENOXAPARIN SODIUM 40 MG/0.4ML IJ SOSY
40.0000 mg | PREFILLED_SYRINGE | INTRAMUSCULAR | Status: DC
  Administered 2021-05-26: 40 mg via SUBCUTANEOUS
  Filled 2021-05-23 (×4): qty 0.4

## 2021-05-23 MED ORDER — ACETAMINOPHEN 325 MG PO TABS
650.0000 mg | ORAL_TABLET | ORAL | Status: DC | PRN
  Filled 2021-05-23: qty 2

## 2021-05-23 MED ORDER — LAMOTRIGINE 100 MG PO TABS
100.0000 mg | ORAL_TABLET | Freq: Two times a day (BID) | ORAL | Status: DC
  Administered 2021-05-23 – 2021-06-02 (×20): 100 mg via ORAL
  Filled 2021-05-23 (×2): qty 4
  Filled 2021-05-23 (×2): qty 1
  Filled 2021-05-23 (×3): qty 4
  Filled 2021-05-23: qty 1
  Filled 2021-05-23 (×2): qty 4
  Filled 2021-05-23: qty 1
  Filled 2021-05-23 (×3): qty 4
  Filled 2021-05-23: qty 1
  Filled 2021-05-23 (×3): qty 4
  Filled 2021-05-23: qty 1
  Filled 2021-05-23: qty 4

## 2021-05-23 MED ORDER — ALUM & MAG HYDROXIDE-SIMETH 200-200-20 MG/5ML PO SUSP
30.0000 mL | ORAL | Status: DC | PRN
  Administered 2021-05-23: 30 mL via ORAL
  Filled 2021-05-23: qty 30

## 2021-05-23 MED ORDER — BUPIVACAINE LIPOSOME 1.3 % IJ SUSP
INTRAMUSCULAR | Status: AC
  Filled 2021-05-23: qty 20

## 2021-05-23 MED ORDER — REMIFENTANIL HCL 1 MG IV SOLR
INTRAVENOUS | Status: AC
Start: 2021-05-23 — End: ?
  Filled 2021-05-23: qty 1000

## 2021-05-23 MED ORDER — PROPOFOL 10 MG/ML IV BOLUS
INTRAVENOUS | Status: DC | PRN
  Administered 2021-05-23: 100 mg via INTRAVENOUS

## 2021-05-23 MED ORDER — LIDOCAINE HCL (PF) 2 % IJ SOLN
INTRAMUSCULAR | Status: AC
  Filled 2021-05-23: qty 5

## 2021-05-23 MED ORDER — AMLODIPINE BESYLATE 5 MG PO TABS
5.0000 mg | ORAL_TABLET | Freq: Two times a day (BID) | ORAL | Status: DC
  Administered 2021-05-23 – 2021-05-26 (×6): 5 mg via ORAL
  Filled 2021-05-23 (×6): qty 1

## 2021-05-23 MED ORDER — SUCRALFATE 1 G PO TABS
1.0000 g | ORAL_TABLET | Freq: Two times a day (BID) | ORAL | Status: DC
  Administered 2021-05-23 – 2021-06-02 (×19): 1 g via ORAL
  Filled 2021-05-23 (×19): qty 1

## 2021-05-23 MED ORDER — PHENYLEPHRINE HCL (PRESSORS) 10 MG/ML IV SOLN
INTRAVENOUS | Status: AC
  Filled 2021-05-23: qty 1

## 2021-05-23 MED ORDER — REMIFENTANIL HCL 1 MG IV SOLR
INTRAVENOUS | Status: DC | PRN
  Administered 2021-05-23: .1 ug/kg/min via INTRAVENOUS

## 2021-05-23 SURGICAL SUPPLY — 78 items
ALLOGRAFT BONE FIBER KORE 10CC (Bone Implant) ×2 IMPLANT
BONE CANC CHIPS 40CC CAN1/2 (Bone Implant) ×2 IMPLANT
BULB RESERV EVAC DRAIN JP 100C (MISCELLANEOUS) ×1 IMPLANT
BUR NEURO DRILL SOFT 3.0X3.8M (BURR) IMPLANT
CAP LOCKING THREADED (Cap) ×8 IMPLANT
CHIPS CANC BONE 40CC CAN1/2 (Bone Implant) ×1 IMPLANT
CHLORAPREP W/TINT 26 (MISCELLANEOUS) ×6 IMPLANT
CORD BIP STRL DISP 12FT (MISCELLANEOUS) ×2 IMPLANT
COUNTER NEEDLE 20/40 LG (NEEDLE) ×4 IMPLANT
COVER BACK TABLE REUSABLE LG (DRAPES) ×2 IMPLANT
CUP MEDICINE 2OZ PLAST GRAD ST (MISCELLANEOUS) ×2 IMPLANT
Channel Round Drain+Trocar ×1 IMPLANT
DERMABOND ADVANCED (GAUZE/BANDAGES/DRESSINGS) ×3
DERMABOND ADVANCED .7 DNX12 (GAUZE/BANDAGES/DRESSINGS) ×3 IMPLANT
DRAPE 3D C-ARM OEC (DRAPES) IMPLANT
DRAPE C ARM PK CFD 31 SPINE (DRAPES) ×1 IMPLANT
DRAPE INCISE IOBAN 66X45 STRL (DRAPES) ×2 IMPLANT
DRAPE LAPAROTOMY 100X77 ABD (DRAPES) ×4 IMPLANT
DRAPE SCAN PATIENT (DRAPES) ×3 IMPLANT
DRAPE SURG 17X11 SM STRL (DRAPES) ×4 IMPLANT
ELECT CAUTERY BLADE TIP 2.5 (TIP) ×2
ELECT EZSTD 165MM 6.5IN (MISCELLANEOUS) ×2
ELECT REM PT RETURN 9FT ADLT (ELECTROSURGICAL) ×2
ELECTRODE CAUTERY BLDE TIP 2.5 (TIP) ×1 IMPLANT
ELECTRODE EZSTD 165MM 6.5IN (MISCELLANEOUS) ×1 IMPLANT
ELECTRODE REM PT RTRN 9FT ADLT (ELECTROSURGICAL) ×2 IMPLANT
FEE INTRAOP CADWELL SUPPLY NCS (MISCELLANEOUS) ×1 IMPLANT
FEE INTRAOP MONITOR IMPULS NCS (MISCELLANEOUS) ×1 IMPLANT
GLOVE BIOGEL PI IND STRL 6.5 (GLOVE) ×3 IMPLANT
GLOVE BIOGEL PI INDICATOR 6.5 (GLOVE) ×3
GLOVE SURG SYN 6.5 ES PF (GLOVE) ×10 IMPLANT
GLOVE SURG SYN 6.5 PF PI (GLOVE) ×5 IMPLANT
GLOVE SURG SYN 8.5  E (GLOVE) ×6
GLOVE SURG SYN 8.5 E (GLOVE) ×6 IMPLANT
GLOVE SURG SYN 8.5 PF PI (GLOVE) ×6 IMPLANT
GOWN SRG LRG LVL 4 IMPRV REINF (GOWNS) ×2 IMPLANT
GOWN SRG XL LVL 3 NONREINFORCE (GOWNS) ×2 IMPLANT
GOWN STRL NON-REIN TWL XL LVL3 (GOWNS) ×2
GOWN STRL REIN LRG LVL4 (GOWNS) ×2
GRADUATE 1200CC STRL 31836 (MISCELLANEOUS) ×2 IMPLANT
GRAFT BNE CHIP CANC 1-8 40 (Bone Implant) IMPLANT
HEMOVAC 400CC 10FR (MISCELLANEOUS) ×1 IMPLANT
INTERBODY SABLE 10X26 7-14 15D (Miscellaneous) ×2 IMPLANT
INTRAOP CADWELL SUPPLY FEE NCS (MISCELLANEOUS)
INTRAOP DISP SUPPLY FEE NCS (MISCELLANEOUS)
INTRAOP MONITOR FEE IMPULS NCS (MISCELLANEOUS)
INTRAOP MONITOR FEE IMPULSE (MISCELLANEOUS)
KIT PREVENA INCISION MGT20CM45 (CANNISTER) ×1 IMPLANT
KIT SPINAL PRONEVIEW (KITS) ×2 IMPLANT
KIT TURNOVER KIT A (KITS) ×2 IMPLANT
MANIFOLD NEPTUNE II (INSTRUMENTS) ×4 IMPLANT
MARKER SKIN DUAL TIP RULER LAB (MISCELLANEOUS) ×5 IMPLANT
MARKER SPHERE PSV REFLC 13MM (MARKER) ×15 IMPLANT
NDL SAFETY ECLIPSE 18X1.5 (NEEDLE) ×1 IMPLANT
NEEDLE HYPO 18GX1.5 SHARP (NEEDLE) ×1
NEEDLE HYPO 22GX1.5 SAFETY (NEEDLE) ×2 IMPLANT
NS IRRIG 1000ML POUR BTL (IV SOLUTION) ×1 IMPLANT
PACK LAMINECTOMY NEURO (CUSTOM PROCEDURE TRAY) ×2 IMPLANT
PAD ARMBOARD 7.5X6 YLW CONV (MISCELLANEOUS) ×2 IMPLANT
PENCIL ELECTRO HAND CTR (MISCELLANEOUS) ×2 IMPLANT
ROD TI CVD 5.5MMX90MM (Rod) ×2 IMPLANT
SCREW CREO SPINAL 6.5X45 (Screw) ×4 IMPLANT
SCREW CREO SPINAL 7.5X45 (Screw) ×4 IMPLANT
STAPLER SKIN PROX 35W (STAPLE) IMPLANT
SURGIFLO W/THROMBIN 8M KIT (HEMOSTASIS) ×2 IMPLANT
SUT DVC VLOC 3-0 CL 6 P-12 (SUTURE) ×2 IMPLANT
SUT ETHILON 3 0 FSLX (SUTURE) ×1 IMPLANT
SUT ETHILON 3-0 FS-10 30 BLK (SUTURE) ×2
SUT VIC AB 0 CT1 27 (SUTURE) ×3
SUT VIC AB 0 CT1 27XCR 8 STRN (SUTURE) ×1 IMPLANT
SUT VIC AB 2-0 CT1 18 (SUTURE) ×4 IMPLANT
SUT VLOC 90 6 CV-15 VIOLET (SUTURE) IMPLANT
SUTURE EHLN 3-0 FS-10 30 BLK (SUTURE) IMPLANT
SYR 30ML LL (SYRINGE) ×4 IMPLANT
TOWEL OR 17X26 4PK STRL BLUE (TOWEL DISPOSABLE) ×10 IMPLANT
TRAY FOLEY MTR SLVR 16FR STAT (SET/KITS/TRAYS/PACK) ×1 IMPLANT
TUBING CONNECTING 10 (TUBING) ×4 IMPLANT
WATER STERILE IRR 500ML POUR (IV SOLUTION) IMPLANT

## 2021-05-23 NOTE — Progress Notes (Addendum)
PHARMACY -  BRIEF ANTIBIOTIC NOTE  ? ?Pharmacy has received consult(s) for Cefazolin & Vancomycin from an OR provider.  The patient's profile has been reviewed for ht/wt/allergies /indication/ available labs.  Pt med hx includes allergy to Ceftin causing anaphylaxis, contacted SDS RN. ? ?One time order(s) placed for Vancomycin 1 gm pre-op ? ?RN called.  RN spoke with pt.  Pt states she has tolerated both Vancomycin and Cefazolin in past.  Placed one time order for Cefazolin 2 gm pre-op. ? ?Further antibiotics/pharmacy consults should be ordered by admitting physician if indicated.       ?                ?Thank you, ?Renda Rolls, PharmD, MBA ?05/23/2021 ?6:30 AM ? ? ?

## 2021-05-23 NOTE — Plan of Care (Signed)

## 2021-05-23 NOTE — Anesthesia Procedure Notes (Signed)
Procedure Name: Intubation ?Date/Time: 05/23/2021 7:27 AM ?Performed by: Cammie Sickle, CRNA ?Pre-anesthesia Checklist: Patient identified, Patient being monitored, Timeout performed, Emergency Drugs available and Suction available ?Patient Re-evaluated:Patient Re-evaluated prior to induction ?Oxygen Delivery Method: Circle system utilized ?Preoxygenation: Pre-oxygenation with 100% oxygen ?Induction Type: IV induction ?Ventilation: Mask ventilation without difficulty ?Laryngoscope Size: 3 and McGraph ?Grade View: Grade I ?Tube type: Oral ?Tube size: 7.0 mm ?Number of attempts: 1 ?Airway Equipment and Method: Stylet ?Placement Confirmation: ETT inserted through vocal cords under direct vision, positive ETCO2 and breath sounds checked- equal and bilateral ?Secured at: 21 cm ?Tube secured with: Tape ?Dental Injury: Teeth and Oropharynx as per pre-operative assessment  ?Comments: Pt. Intubated in her stretcher for the procedure.  ? ? ? ? ?

## 2021-05-23 NOTE — Discharge Instructions (Addendum)
NEUROSURGERY DISCHARGE INSTRUCTIONS ? ?Admission diagnosis: S/P lumbar fusion [Z98.1] ? ?Operative procedure: L2-3 and L3-4 TLIF. L2-5 PSF ? ?What to do after you leave the hospital: ? ?Recommended diet: regular diet. Increase protein intake to promote wound healing. ? ?Recommended activity: no lifting, driving, or strenuous exercise for 4 weeks .You should walk multiple times per day ? ?Special Instructions ? ?No straining, no heavy lifting > 10lbs x 4 weeks.  Keep incision area clean and dry. May shower in 2 days. No baths or pools for 6 weeks.  ?Please remove dressing tomorrow, no need to apply a bandage afterwards ? ?You have no sutures to remove, the skin is closed with adhesive ? ?Please take pain medications as directed. Take a stool softener if on pain medications ? ? ?Please Report any of the following: ?Nausea or Vomiting, Temperature is greater than 101.49F (38.1C) degrees, Dizziness, Abdominal Pain, Difficulty Breathing or Shortness of Breath, Inability to Eat, drink Fluids, or Take medications, Bleeding, swelling, or drainage from surgical incision sites, New numbness or weakness, and Bowel or bladder dysfunction to the neurosurgeon on call at [phone] ? ?Additional Follow up appointments ?Please follow up with Cooper Render PA-C in [place] clinic as scheduled in 2-3 weeks ? ? ?Please see below for scheduled appointments: ? ?Future Appointments  ?Date Time Provider [place]  ?06/26/2021  2:00 PM [name], MD [id number] None  ?12/04/2021  2:00 PM Vaillancourt, Aldona Bar, PA-C [facility] None  ? ? ?  ?

## 2021-05-23 NOTE — Progress Notes (Signed)
PT Cancellation Note ? ?Patient Details ?Name: Kerri Carter ?MRN: [id number] ?DOB: [birth year] ? ? ?Cancelled Treatment:    Reason Eval/Treat Not Completed: Other (comment) PT orders received, chart reviewed. Pt still does not have back brace that physician has ordered & unable to mobilize pt without it. Will hold PT evaluation until pt receives back brace. ? ?Lavone Nian, PT, DPT ?05/23/21, 2:33 PM ? ?[name] ?05/23/2021, 2:31 PM ?

## 2021-05-23 NOTE — Anesthesia Preprocedure Evaluation (Signed)
Anesthesia Evaluation  ?Patient identified by MRN, date of birth, ID band ?Patient awake ? ? ? ?Reviewed: ?Allergy & Precautions, NPO status , Patient's Chart, lab work & pertinent test results ? ?Airway ?Mallampati: III ? ?TM Distance: >3 FB ?Neck ROM: full ? ? ? Dental ? ?(+) Upper Dentures, Lower Dentures ?  ?Pulmonary ?neg pulmonary ROS, asthma , sleep apnea and Oxygen sleep apnea , COPD,  COPD inhaler, former smoker,  ?  ?Pulmonary exam normal ?breath sounds clear to auscultation ?+ decreased breath sounds ? ? ? ? ? Cardiovascular ?Exercise Tolerance: Good ?hypertension, Pt. on medications ?+ CAD  ?negative cardio ROS ?Normal cardiovascular exam ?Rhythm:Regular  ? ?  ?Neuro/Psych ?Anxiety Depression Bipolar Disorder negative neurological ROS ? negative psych ROS  ? GI/Hepatic ?negative GI ROS, Neg liver ROS, GERD  ,  ?Endo/Other  ?negative endocrine ROS ? Renal/GU ?Renal InsufficiencyRenal diseaseS/p r nephrectomy  ? ?  ?Musculoskeletal ? ?(+) Arthritis ,  ? Abdominal ?(+) + obese,   ?Peds ?negative pediatric ROS ?(+)  Hematology ?negative hematology ROS ?(+)   ?Anesthesia Other Findings ?Past Medical History: ?No date: Anxiety ?07/09/2015: Aortic stenosis ?    Comment:  a.) TTE 07/06/2015: EF 55-60%; mild AS with MPG 13.0  ?             mmHg. ?No date: Asthma ?No date: Bipolar disorder ([place]) ?2010: C. difficile diarrhea ?    Comment:  a.) following ABX course during hospital admission ?No date: Carotid atherosclerosis, bilateral ?    Comment:  a.) Moderate; < 50% stenosis BILATERAL ICAs. ?No date: Cataract ?    Comment:  a.) s/p BILATERAL extraction ?No date: Chicken pox ?No date: CKD (chronic kidney disease), stage III ([place]) ?    Comment:  a. s/p R nephrectomy./ aneurysm ?No date: Conversion disorder ?No date: COPD (chronic obstructive pulmonary disease) ([place]) ?No date: Depression ?No date: Essential hypertension ?No date: GERD (gastroesophageal reflux disease) ?No date:  Heart murmur ?No date: Hyperlipidemia ?No date: ILD (interstitial lung disease) ([place]) ?    Comment:  mild; 2/2 RA diagnosis ?No date: Inflammatory arthritis ?    Comment:  a. hands/carpal tunnel.  b. Low titer rheumatoid factor. ?             c. Negative anti-CCP antibodies. d. Plaquenil. ?No date: Macular degeneration ?No date: Nocturnal hypoxemia ?No date: Non-Obstructive CAD ?    Comment:  a. 07/2009 Cath ([facility]): nonobs dzs;  b. 03/2011 Cath  ?             [facility]): nonobs dzs. ?No date: Osteoarthritis ?    Comment:  a. Knees. ?No date: PAD (peripheral artery disease) ([place]) ?No date: PUD (peptic ulcer disease) ?No date: S/P right hip fracture ?    Comment:  11/01/16 s/p repair ?No date: Shoulder pain ?No date: Sleep apnea ?    Comment:  no cpap / minimal ?No date: Spinal stenosis at L4-L5 level ?    Comment:  severe with L4/L5 anterolisthesis grade 1  ?             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    Comment:  a.) TTE 07/06/2015: EF 55-60%; Mild MR/AR/TR; Mild AS  ?             with MPG 13.0 mmHg. ? ?Past Surgical History: ?No date: APPENDECTOMY ?No date: BACK SURGERY ?    Comment:  lumbar fusion ?No date: BUNIONECTOMY; Right ?No date: CATARACT  EXTRACTION, BILATERAL ?No date: CESAREAN SECTION ?    Comment:  x1 ?05/11/2016: CHOLECYSTECTOMY; N/A ?    Comment:  Procedure: LAPAROSCOPIC CHOLECYSTECTOMY;  Surgeon:  ?             Florene Glen, MD;  Location: [facility] ORS;  Service:  ?             General;  Laterality: N/A; ?04/02/2016: COLONOSCOPY WITH PROPOFOL; N/A ?    Comment:  Procedure: COLONOSCOPY WITH PROPOFOL;  Surgeon: Bailey Mech  ?             Vicente Males, MD;  Location: [facility] ENDOSCOPY;  Service: Endoscopy; ?             Laterality: N/A; ?05/08/2016: ENDOSCOPIC RETROGRADE CHOLANGIOPANCREATOGRAPHY (ERCP) WITH  ?PROPOFOL; N/A ?    Comment:  Procedure: ENDOSCOPIC RETROGRADE  ?             CHOLANGIOPANCREATOGRAPHY (ERCP) WITH PROPOFOL;  Surgeon:  ?             Lucilla Lame, MD;  Location:  [facility] ENDOSCOPY;  Service:  ?             Endoscopy;  Laterality: N/A; ?No date: ERCP ?    Comment:  with biliary spincterotomy 05/08/16 Dr. Allen Norris for  ?             choledocholithiasis  ?04/02/2016: ESOPHAGEAL DILATION ?    Comment:  Procedure: ESOPHAGEAL DILATION;  Surgeon: Jonathon Bellows,  ?             MD;  Location: [facility] ENDOSCOPY;  Service: Endoscopy;; ?04/02/2016: ESOPHAGOGASTRODUODENOSCOPY (EGD) WITH PROPOFOL; N/A ?    Comment:  Procedure: ESOPHAGOGASTRODUODENOSCOPY (EGD) WITH  ?             PROPOFOL;  Surgeon: Jonathon Bellows, MD;  Location: [place]  ?             ENDOSCOPY;  Service: Endoscopy;  Laterality: N/A; ?11/01/2016: HIP ARTHROPLASTY; Right ?    Comment:  Procedure: ARTHROPLASTY BIPOLAR HIP (HEMIARTHROPLASTY);  ?             Surgeon: Corky Mull, MD;  Location: [facility] ORS;   ?             Service: Orthopedics;  Laterality: Right; ?12/11/2020: LUMBAR LAMINECTOMY/DECOMPRESSION MICRODISCECTOMY; N/A ?    Comment:  Procedure: LEFT L2-3 MICRODISCECTOMY, L3-4 AND L5-S1  ?             DECOMPRESSION;  Surgeon: Meade Maw, MD;   ?             Location: [facility] ORS;  Service: Neurosurgery;  Laterality:  ?             N/A; ?1988: NEPHRECTOMY ?    Comment:  right nephrectomy recondary to aneurysm of the right  ?             renal artery ?12/21/2019: ORIF SCAPHOID FRACTURE; Left ?    Comment:  Procedure: OPEN REDUCTION INTERNAL FIXATION (ORIF) OF  ?             LEFT SCAPULAR NONUNION WITH BONE GRAFT;  Surgeon: Roland Rack,  ?             Marshall Cork, MD;  Location: [facility] ORS;  Service: Orthopedics;   ?             Laterality: Left; ?No date: osteoporosis ?    Comment:  noted DEXA 08/19/16  ?No date: REPLACEMENT TOTAL  KNEE; Right ?11/04/2017: REVERSE SHOULDER ARTHROPLASTY; Right ?    Comment:  Procedure: REVERSE SHOULDER ARTHROPLASTY;  Surgeon:  ?             Corky Mull, MD;  Location: [facility] ORS;  Service:  ?             Orthopedics;  Laterality: Right; ?07/26/2018: REVERSE SHOULDER ARTHROPLASTY; Left ?    Comment:  Procedure: REVERSE  SHOULDER ARTHROPLASTY;  Surgeon:  ?             Corky Mull, MD;  Location: [facility] ORS;  Service:  ?             Orthopedics;  Laterality: Left; ?No date: TONSILLECTOMY ?12/10/11: TOTAL HIP ARTHROPLASTY ?    Comment:  [facility] left hip ?No date: TOTAL HIP ARTHROPLASTY; Bilateral ?No date: TUBAL LIGATION ? ? ? ? Reproductive/Obstetrics ?negative OB ROS ? ?  ? ? ? ? ? ? ? ? ? ? ? ? ? ?  ?  ? ? ? ? ? ? ? ? ?Anesthesia Physical ?Anesthesia Plan ? ?ASA: 3 ? ?Anesthesia Plan: General  ? ?Post-op Pain Management:   ? ?Induction: Intravenous ? ?PONV Risk Score and Plan: Ondansetron, Dexamethasone, Midazolam and Treatment may vary due to age or medical condition ? ?Airway Management Planned: Oral ETT ? ?Additional Equipment:  ? ?Intra-op Plan:  ? ?Post-operative Plan: Extubation in OR ? ?Informed Consent: I have reviewed the patients History and Physical, chart, labs and discussed the procedure including the risks, benefits and alternatives for the proposed anesthesia with the patient or authorized representative who has indicated his/her understanding and acceptance.  ? ? ? ?Dental Advisory Given ? ?Plan Discussed with: CRNA and Surgeon ? ?Anesthesia Plan Comments:   ? ? ? ? ? ? ?Anesthesia Quick Evaluation ? ?

## 2021-05-23 NOTE — Transfer of Care (Signed)
Immediate Anesthesia Transfer of Care Note ? ?Patient: Kerri Carter ? ?Procedure(s) Performed: OPEN L2-4 TRANSFORAMINAL LUMBAR INTERBODY FUSION (TLIF) WITH L2-5 POSTERIOR SPINAL FUSION (Spine Lumbar) ?APPLICATION OF INTRAOPERATIVE CT SCAN (Spine Lumbar) ? ?Patient Location: PACU ? ?Anesthesia Type:General ? ?Level of Consciousness: drowsy and patient cooperative ? ?Airway & Oxygen Therapy: Patient Spontanous Breathing and Patient connected to face mask oxygen ? ?Post-op Assessment: Report given to RN and Post -op Vital signs reviewed and stable ? ?Post vital signs: Reviewed and stable ? ?Last Vitals:  ?Vitals Value Taken Time  ?BP 118/99 05/23/21 1145  ?Temp 37.7 ?C 05/23/21 1141  ?Pulse 82 05/23/21 1145  ?Resp 32 05/23/21 1146  ?SpO2 99 % 05/23/21 1145  ?Vitals shown include unvalidated device data. ? ?Last Pain:  ?Vitals:  ? 05/23/21 0648  ?TempSrc: Temporal  ?PainSc: 3   ?   ? ?  ? ?Complications: No notable events documented. ?

## 2021-05-23 NOTE — H&P (Signed)
DOS: left L2-3 microdiscectomy, left L3- 4 and L5-S1 decompression on 12/11/2020  ?DOS: 09/08/2016 (L4-5 XLIF/PSFD) ? ?HISTORY OF PRESENT ILLNESS:  ?05/23/2021 ?Kerri Carter is status post lumbar decompression. ? ?She has had worsening pain. She has failed conservative management for her condition. ? ?Family History  ?Problem Relation Age of Onset  ? Rheum arthritis Mother   ? Asthma Mother   ? Parkinson's disease Mother   ? Heart disease Mother   ? Stroke Mother   ? Hypertension Mother   ? Heart attack Father   ? Heart disease Father   ? Hypertension Father   ? Peripheral Artery Disease Father   ? Diabetes Son   ? Gout Son   ? Asthma Sister   ? Heart disease Sister   ? Lung cancer Sister   ? Heart disease Sister   ? Heart disease Sister   ? Breast cancer Sister   ? Heart attack Sister   ? Heart disease Brother   ? Heart disease Maternal Grandmother   ? Diabetes Maternal Grandmother   ? Colon cancer Maternal Grandmother   ? Cancer Maternal Grandmother   ?     Hodgkins lymphoma  ? Heart disease Brother   ? Alcohol abuse Brother   ? Depression Brother   ? Dementia Son   ? ?Social History  ? ?Socioeconomic History  ? Marital status: Widowed  ?  Spouse name: richard  ? Number of children: 2  ? Years of education: Some Coll  ? Highest education level: Some college, no degree  ?Occupational History  ? Occupation: Retired  ?  Comment: retired  ?Tobacco Use  ? Smoking status: Former  ?  Packs/day: 0.50  ?  Years: 20.00  ?  Pack years: 10.00  ?  Types: Cigarettes  ?  Quit date: 02/02/1974  ?  Years since quitting: 47.3  ? Smokeless tobacco: Never  ?Vaping Use  ? Vaping Use: Never used  ?Substance and Sexual Activity  ? Alcohol use: No  ? Drug use: No  ? Sexual activity: Not Currently  ?Other Topics Concern  ? Not on file  ?Social History Narrative  ? Lives at home in [place] grandson, his wife and their child.  ? Husband Jasmin Winberry died covid 37 late 1/2072mrried x 550years  ? Right-handed.  ? 6 cups coffee per day.   ? ?Social Determinants of Health  ? ?Financial Resource Strain: Not on file  ?Food Insecurity: Not on file  ?Transportation Needs: No Transportation Needs  ? Lack of Transportation (Medical): No  ? Lack of Transportation (Non-Medical): No  ?Physical Activity: Not on file  ?Stress: Not on file  ?Social Connections: Not on file  ?Intimate Partner Violence: Not on file  ? ?Allergies  ?Allergen Reactions  ? Ceftin [Cefuroxime Axetil] Anaphylaxis  ? Lisinopril Anaphylaxis  ? Sulfa Antibiotics Other (See Comments)  ?  Face swelling  ? Sulfasalazine Anaphylaxis  ? Morphine Other (See Comments)  ?  Per patient, low blood pressure issues that requires action to raise it back up. ?Can take small infrequent doses  ? Xarelto [Rivaroxaban] Other (See Comments)  ?  Stomach burning, bleeding, and tar in stool  ? Adhesive [Tape] Rash  ?  Paper tape and tega derm OK  ? Antihistamines, Chlorpheniramine-Type Other (See Comments)  ?  Makes pt hyper  ? Antivert [Meclizine Hcl] Other (See Comments)  ?  Bladder will not empty  ? Aspirin Other (See Comments)  ?  Sulfasalazine allergy cross  reacts  ? Contrast Media [Iodinated Contrast Media] Rash  ?  she is able to use betadine scrubs.  ? Decongestant [Pseudoephedrine Hcl] Other (See Comments)  ?  Makes pt hyper  ? Doxycycline Other (See Comments)  ?  GI upset  ? Levaquin [Levofloxacin In D5w] Rash  ? Polymyxin B Other (See Comments)  ?  Facial rash  ? Tetanus Toxoids Rash and Other (See Comments)  ?  Fever and hot to touch at injection site  ? ?Current Meds  ?Medication Sig  ? Adalimumab 40 MG/0.4ML PNKT Inject 40 mg into the skin every 14 (fourteen) days.  ? albuterol (VENTOLIN HFA) 108 (90 Base) MCG/ACT inhaler Inhale 2 puffs into the lungs every 6 (six) hours as needed for wheezing or shortness of breath.  ? amLODipine (NORVASC) 2.5 MG tablet Take 1 tablet (2.5 mg total) by mouth daily. (Patient taking differently: Take 5 mg by mouth 2 (two) times daily.)  ? amoxicillin-clavulanate  (AUGMENTIN) 875-125 MG tablet Take 1 tablet by mouth 2 (two) times daily. With food x 7-10 days  ? benzonatate (TESSALON) 200 MG capsule Take 1 capsule (200 mg total) by mouth 3 (three) times daily as needed for cough.  ? budesonide (PULMICORT) 0.25 MG/2ML nebulizer solution USE 1 VIAL  IN  NEBULIZER TWICE  DAILY - rinse mouth after treatment  ? Cholecalciferol (VITAMIN D3) 50 MCG (2000 UT) TABS Take 4,000 Units by mouth daily.  ? clopidogrel (PLAVIX) 75 MG tablet Take 75 mg by mouth daily.  ? cyclobenzaprine (FLEXERIL) 10 MG tablet Take 1 tablet (10 mg total) by mouth 3 (three) times daily as needed for muscle spasms.  ? dicyclomine (BENTYL) 10 MG capsule Take 1 capsule (10 mg total) by mouth 2 (two) times daily as needed for spasms. (Patient taking differently: Take 20 mg by mouth 2 (two) times daily.)  ? erythromycin ophthalmic ointment Place 1 application. into both eyes 3 (three) times daily. X4-7 days  ? escitalopram (LEXAPRO) 10 MG tablet TAKE 1 TABLET BY MOUTH EVERY DAY  ? famotidine (PEPCID) 20 MG tablet TAKE 1 TABLET (20 MG TOTAL) BY MOUTH DAILY. BEFORE BREAKFAST OR DINNER  ? fluconazole (DIFLUCAN) 150 MG tablet TAKE 1 TABLET (150 MG TOTAL) BY MOUTH ONCE FOR 1 DOSE. REPEAT 2ND DOSE IN 3-7 DAYS AS NEEDED  ? fluticasone (FLONASE) 50 MCG/ACT nasal spray Place 2 sprays into both nostrils daily. In am  ? gabapentin (NEURONTIN) 300 MG capsule Take 2 capsules (600 mg total) by mouth in the morning and at bedtime.  ? ipratropium-albuterol (DUONEB) 0.5-2.5 (3) MG/3ML SOLN TAKE 3 MLS BY NEBULIZATION 3 (THREE) TIMES DAILY AS NEEDED. (Patient taking differently: Take 3 mLs by nebulization 2 (two) times daily.)  ? lamoTRIgine (LAMICTAL) 100 MG tablet TAKE 1 TAB 2 TIMES DAILY. FURTHER REFILLS NEW PSYCH FOR ALL PSYCH MEDS ONLY TEMP SUPPLY FROM PCP  ? leflunomide (ARAVA) 20 MG tablet Take 1 tablet (20 mg total) by mouth daily.  ? lidocaine (LIDODERM) 5 % Place 1 patch onto the skin 2 (two) times daily as needed. Remove &  Discard patch within 12 hours or as directed by MD (Patient taking differently: Place 1 patch onto the skin daily. Remove & Discard patch within 12 hours or as directed by MD)  ? lovastatin (MEVACOR) 20 MG tablet TAKE 1 TABLET BY MOUTH EVERYDAY AT BEDTIME *STOP TALKING '40MG'$ * (Patient taking differently: Take 20 mg by mouth at bedtime.)  ? methocarbamol (ROBAXIN) 500 MG tablet Take 500 mg by mouth  4 (four) times daily as needed.  ? mirabegron ER (MYRBETRIQ) 50 MG TB24 tablet Take 1 tablet (50 mg total) by mouth daily.  ? montelukast (SINGULAIR) 10 MG tablet TAKE 1 TABLET BY MOUTH EVERY DAY  ? multivitamin-lutein (OCUVITE-LUTEIN) CAPS capsule Take 1 capsule by mouth at bedtime.  ? nebivolol (BYSTOLIC) 10 MG tablet Take 0.5 tablets (5 mg total) by mouth in the morning and at bedtime.  ? OXYGEN Inhale 2 L into the lungs at bedtime.  ? predniSONE (DELTASONE) 5 MG tablet TAKE 1.5 TABLETS (7.5 MG TOTAL) BY MOUTH DAILY WITH BREAKFAST.  ? QUEtiapine (SEROQUEL) 25 MG tablet TAKE 1 TABLET (25 MG TOTAL) BY MOUTH AT BEDTIME. AGAIN LAST FILL FURTHER REFILLS FROM PSYCHIATRY NO EXCEPTIONS  ? sucralfate (CARAFATE) 1 g tablet Take 1 tablet (1 g total) by mouth 2 (two) times daily.  ? ?ROS: negative other than above ? ?PHYSICAL EXAMINATION:  ?Vitals: see record ? ?Heart sounds normal no MRG. Chest Clear to Auscultation Bilaterally. ? ? ? ?General: Patient is well developed, well nourished, calm, collected, and in no apparent distress. ? ?NEUROLOGICAL:  ?General: In no acute distress.  ?Awake, alert, oriented to person, place, and time. Pupils equal round and reactive to light.  ?Strength: ? ?Side Iliopsoas Quads Hamstring PF DF EHL  ?R '5 5 5 5 5 4  '$ ?L '5 5 5 5 5 5  '$ ? ?Incision c/d/i ? ?ROS (Neurologic): Negative except as noted above ? ?IMAGING: ?MRI L spine 04/04/21 ?IMPRESSION:  ?1. At L2-3 there is a left laminectomy. Broad-based disc bulge with  ?a left paracentral disc protrusion with mass effect on the left  ?intraspinal L3 nerve  roots. Mild left foraminal stenosis. Mild  ?spinal stenosis. Mild bilateral facet arthropathy.  ?2. At L3-4 there is a broad-based disc bulge with a large broad  ?central disc protrusion and a large right pa

## 2021-05-23 NOTE — Op Note (Signed)
Indications: Kerri Carter is a 81 yo female who presented today with: ? ?Lumbar adjacent segment disease with spondylolisthesis M51.36, M43.16, Lumbar radiculopathy M54.16 ? ?She failed conservative management prompting surgical intervention. ? ?Findings: Lumbar adjacent segment disease with spondylolisthesis M51.36, M43.16, Lumbar radiculopathy M54.16 ? ?Preoperative Diagnosis: Lumbar adjacent segment disease with spondylolisthesis M51.36, M43.16, Lumbar radiculopathy M54.16 ?Postoperative Diagnosis: same ? ? ?EBL: 300 ml ?IVF: 700 ml ?Drains: 2 placed ?Disposition: Extubated and Stable to PACU ?Complications: none ? ?A foley catheter was placed. ? ? ?Preoperative Note:  ? ?Risks of surgery discussed include: infection, bleeding, stroke, coma, death, paralysis, CSF leak, nerve/spinal cord injury, numbness, tingling, weakness, complex regional pain syndrome, recurrent stenosis and/or disc herniation, vascular injury, development of instability, neck/back pain, need for further surgery, persistent symptoms, development of deformity, and the risks of anesthesia. The patient understood these risks and agreed to proceed. ? ?Operative Note: ? ?1. Transforaminal Lumbar Interbody Fusion L3/4 and :2/3 ?2. Posterolateral arthrodesis L2 to L5 ?3. Posterior segmental instrumentation L2 to L5 ?4. Use of stereotaxis ?5. Harvesting of autograft via the same incision ?6. Placement of a biomechanical device ([place]a) at L2/3 and L3/4 for anterior arthrodesis ? ? ? ?The patient was brought to the Operating Room, intubated and turned into the prone position. All pressure points were checked and double checked. Flouroscopy was used to mark the incision. The patient was prepped and draped in the standard fashion. A full timeout was performed. Preoperative antibiotics were given. The incision was injected with local anesthetic. ? ?The incision was opened with a scalpel, then the soft tissues divided with the Bovie. Self-retaining  retractors were placed. The paraspinus muscles were reflected laterally in subperiosteal fashion until the transverse processes were visible.  The prior L4 and L5 screws were identified and removed.  This was used for localization.  We then confirmed exposure from L2-L5.  The stereotactic array was placed on the spinous process of L1.  We then acquired stereotactic imaging and registered to the [place] system. ? ?Using stereotaxis, the L2 and L3 pedicles were cannulated bilaterally.  The ball-tipped probe was used to confirm lack of breach.  6.5 x 45 mm screws were then placed at L2 and L3 bilaterally.  This was all done with stereotaxis. ? ?The L4 and L5 tracts were palpated with a ball-tipped probe to confirm lack of breach.  7.5 x 45 mm screws were placed at each level.   ? ?After placement of pedicle screws, a screw-to-screw distractor was placed to distract the L3/4 disc space. We then turned attention to performing the transforaminal decompression and interbody fusion. The right L3/4 facet was removed with osteotomes and the drill, and handed off for preparation as autograft. The traversing and exiting nerve roots on the right were identified and protected. The disc was opened using a scalpel. After incising the disc space, we took a combination of pituitary rongeurs, Kerrison rongeurs, disc scrapers, and curettes to remove a majority of the disc material.  We prepared the end plates for accepting the interbody fusion.  We removed the cartilaginous plate, preserved the cortical endplate if possible during this procedure.  We serially dilated up in order to increase the size of the disc space, while protecting the traversing and exiting nerve roots, until we had sized up to a trial.   ? ?The trial was removed, and the disc space packed with autograft and allograft. The Globus Sable TLIF biomechanical device was inserted, expanded, then backfilled with a mixture of  allograft and autograft. After placement of  the device, the screw-screw distractor was removed.  Additional compression was noted inferior to the disc base at L3-4.  Additional discectomy was performed behind the L4 vertebral body. ? ?After the L3-4 TLIF was performed, a screw-to-screw distractor was placed to distract the L2-3 disc space. We then turned attention to performing the transforaminal decompression and interbody fusion. The right L2/3 facet was removed with osteotomes and the drill, and handed off for preparation as autograft. The traversing and exiting nerve roots on the right were identified and protected. The disc was opened using a scalpel. After incising the disc space, we took a combination of pituitary rongeurs, Kerrison rongeurs, disc scrapers, and curettes to remove a majority of the disc material.  We prepared the end plates for accepting the interbody fusion.  We removed the cartilaginous plate, preserved the cortical endplate if possible during this procedure.  We serially dilated up in order to increase the size of the disc space, while protecting the traversing and exiting nerve roots, until we had sized up to a trial.   ? ?The trial was removed, and the disc space packed with autograft and allograft. The globus Sable TLIF biomechanical device was inserted, expanded, and backfilled with a mixture of allograft and autograft, with care taken to protect the nerve roots and thecal sac. After placement of the device, the screw-screw distractor was removed. ? ? ?Rods were measured to length, cut, and shaped. The rods were secured using locking caps to manufacturer's specifications. Final AP and lateral radiographs were taken to confirm placement of instrumentation and appropriate alignment. The wound was copiously irrigated, then the external surfaces of the remaining lamina, facet, and transverse processes from L2 to L5 were decorticated. A mixture of allograft and autograft was placed over the decorticated surfaces for arthrodesis. ? ?A  drain was placed subfascially. A 2nd drain was placed superficially. ? ?After hemostasis, the wound was closed in layers with 0 and 2-0 vicryl. 3-0 monocryl and a woundvac were applied to the incision. ? ?The patient was then flipped supine and extubated with incident. All counts were correct times 2 at the end of the case. No immediate complications were noted. ? ?Cooper Render PA assisted in the entire procedure. ? ?Meade Maw MD ? ? ?

## 2021-05-23 NOTE — Evaluation (Signed)
Occupational Therapy Evaluation ?Patient Details ?Name: Kerri Carter ?MRN: [id number] ?DOB: 1940-11-27 ?Today's Date: 05/23/2021 ? ? ?History of Present Illness Pt is a 81 y/o F admitted on who presents with spondylithesis and lumbar radiculopathy and has failed conservative management. Pt is now s/p L 3/4 & 2/3 fusion with posterolateral arthrodesis from L2 to L5. PMH: anxiety, aortic stenosis, asthma, CKD, CVA, depression, GERD/PUD, retention, interstitial lung disease, PAD, bipolar disorder, conversion disorder, heart murmur, HLD  ? ?Clinical Impression ?  ?Pt seen for OT evaluation this date, POD#0 from above surgery. Prior to hospital admission, pt was modified independent using a 4WW for functional mobility and transferring to a BSC at baseline. Pt endorses multiple falls in the last 6 months. She lives with her adult grandson and his spouse in a 1 level home with a ramped entrance. Pt calling out in pain as this author passes her room. She requires assistance with repositioning and RN notified of 10/10 pain. Required TLSO not currently in room so pt not safe to mobilize. Currently pt demonstrates impairments as described below (See OT problem list) which functionally limit her ability to perform ADL/self-care tasks. Pt currently requires MIN A for rolling L<>R in bed, anticipate MAX A for LB ADL management in context of back pain and back precautions. Pt would benefit from skilled OT services to address noted impairments and functional limitations (see below for any additional details) in order to maximize safety and independence while minimizing falls risk and caregiver burden. Upon hospital discharge, recommend STR to maximize pt safety and return to PLOF.  ? ? ?  ?   ? ?Recommendations for follow up therapy are one component of a multi-disciplinary discharge planning process, led by the attending physician.  Recommendations may be updated based on patient status, additional functional criteria and insurance  authorization.  ? ?Follow Up Recommendations ? Skilled nursing-short term rehab (<3 hours/day)  ?  ?Assistance Recommended at Discharge Intermittent Supervision/Assistance  ?Patient can return home with the following A lot of help with walking and/or transfers;A lot of help with bathing/dressing/bathroom;Assist for transportation;Help with stairs or ramp for entrance ? ?  ?Functional Status Assessment ? Patient has had a recent decline in their functional status and demonstrates the ability to make significant improvements in function in a reasonable and predictable amount of time.  ?Equipment Recommendations ? None recommended by OT  ?  ?Recommendations for Other Services   ? ? ?  ?Precautions / Restrictions Precautions ?Precautions: Fall;Back ?Precaution Booklet Issued: No ?Required Braces or Orthoses: Spinal Brace ?Spinal Brace: Thoracolumbosacral orthotic;Applied in sitting position ?Restrictions ?Weight Bearing Restrictions: No  ? ?  ? ?Mobility Bed Mobility ?Overal bed mobility: Needs Assistance ?Bed Mobility: Rolling ?Rolling: Min assist ?  ?  ?  ?  ?General bed mobility comments: Min A for rolling onto L side during session. ?  ? ?Transfers ?  ?  ?  ?  ?  ?  ?  ?  ?  ?General transfer comment: Deferred. Pt crying out in pain, does not have required TLSO in room for mobility attempts. Will continue to monitor. ?  ? ?  ?Balance   ?  ?  ?  ?  ?  ?  ?  ?  ?  ?  ?  ?  ?  ?  ?  ?  ?  ?  ?   ? ?ADL either performed or assessed with clinical judgement  ? ?ADL Overall ADL's : Needs assistance/impaired ?  ?  ?  ?  ?  ?  ?  ?  ?  ?  ?  ?  ?  ?  ?  ?  ?  ?  ?  ?  General ADL Comments: Pt is significantly functionally limtied by increased back pain at rest and with mobility attempts. She has orders to don a TLSO while seated EOB which is not in room during session so further functional activity deferred. Anticipate MOD A for LB ADL Management in sitting position with increased cueing for adherence to back precautions.   ? ? ? ?Vision Patient Visual Report: No change from baseline ?   ?   ?Perception   ?  ?Praxis   ?  ? ?Pertinent Vitals/Pain Pain Assessment ?Pain Assessment: 0-10 ?Pain Score: 10-Worst pain ever ?Pain Location: Back pain. ?Pain Descriptors / Indicators: Grimacing, Guarding, Moaning, Aching (yelling out) ?Pain Intervention(s): Patient requesting pain meds-RN notified, Limited activity within patient's tolerance, Repositioned, Relaxation, Utilized relaxation techniques  ? ? ? ?Hand Dominance Right ?  ?Extremity/Trunk Assessment Upper Extremity Assessment ?Upper Extremity Assessment: Generalized weakness ?  ?Lower Extremity Assessment ?Lower Extremity Assessment: Defer to PT evaluation ?  ?Cervical / Trunk Assessment ?Cervical / Trunk Assessment: Back Surgery ?  ?Communication Communication ?Communication: HOH ?  ?Cognition Arousal/Alertness: Awake/alert ?Behavior During Therapy: Restless ?Overall Cognitive Status: Within Functional Limits for tasks assessed ?  ?  ?  ?  ?  ?  ?  ?  ?  ?  ?  ?  ?  ?  ?  ?  ?General Comments: Pain limited. ?  ?  ?General Comments    ? ?  ?Exercises Other Exercises ?Other Exercises: Pt educated on role of OT in acute setting, back precautions, and complementary alternative methods for pain management. ?  ?Shoulder Instructions    ? ? ?Home Living Family/patient expects to be discharged to:: Private residence ?Living Arrangements: Other relatives Yolanda Bonine) ?Available Help at Discharge: Family;Available PRN/intermittently ?Type of Home: House ?Home Access: Ramped entrance ?  ?  ?Home Layout: One level ?  ?  ?Bathroom Shower/Tub: Walk-in shower ?  ?Bathroom Toilet: Handicapped height ?  ?  ?Home Equipment: Rollator (4 wheels);BSC/3in1;Shower seat;Toilet riser;Grab bars - toilet;Hand held shower head;Grab bars - tub/shower;Adaptive equipment ?Adaptive Equipment: Reacher ?Additional Comments: Pt lives with grandchildren who work during the day. ?  ? ?  ?Prior Functioning/Environment Prior  Level of Function : Independent/Modified Independent;History of Falls (last six months) ?  ?  ?  ?  ?  ?  ?Mobility Comments: Pt provides limited information 2/2 pain. Per chart pt was MOD I for mobility using a rollator at baseline. Pt endorses multiple falls in the last 6 months including at least 3 in the last week. ?ADLs Comments: Pt states she is generally independent to MOD I for ADL management. Doe snot drive. uses brief for incontenece and BSC at bedside. Family assists with IADL management including meal prep and medication mgt. ?  ? ?  ?  ?OT Problem List: Decreased strength;Decreased coordination;Pain;Decreased activity tolerance;Decreased safety awareness;Decreased knowledge of use of DME or AE;Decreased knowledge of precautions ?  ?   ?OT Treatment/Interventions: Self-care/ADL training;Therapeutic exercise;Therapeutic activities;DME and/or AE instruction;Patient/family education;Balance training;Energy conservation  ?  ?OT Goals(Current goals can be found in the care plan section) Acute Rehab OT Goals ?Patient Stated Goal: To have less pain. ?OT Goal Formulation: With patient ?Time For Goal Achievement: 06/06/21 ?Potential to Achieve Goals: Good ?ADL Goals ?Pt Will Perform Grooming: sitting;with modified independence ?Pt Will Perform Lower Body Dressing: sit to/from stand;with adaptive equipment;with set-up;with supervision (c LRAD PRN to support adherence to back precautions.) ?Pt Will Transfer to Toilet: bedside commode;ambulating;with set-up;with supervision (c LRAD PRN to support adherence  to back precautions.) ?Pt Will Perform Toileting - Clothing Manipulation and hygiene: sit to/from stand;with adaptive equipment;with set-up;with supervision (c LRAD PRN to support adherence to back precautions.)  ?OT Frequency: Min 2X/week ?  ? ?Co-evaluation   ?  ?  ?  ?  ? ?  ?AM-PAC OT "6 Clicks" Daily Activity     ?Outcome Measure Help from another person eating meals?: A Little ?Help from another person  taking care of personal grooming?: A Little ?Help from another person toileting, which includes using toliet, bedpan, or urinal?: A Lot ?Help from another person bathing (including washing, rinsing, drying)?: A

## 2021-05-24 LAB — CREATININE, SERUM
Creatinine, Ser: 1.22 mg/dL — ABNORMAL HIGH (ref 0.44–1.00)
GFR, Estimated: 45 mL/min — ABNORMAL LOW (ref >60)

## 2021-05-24 MED ORDER — CHLORHEXIDINE GLUCONATE CLOTH 2 % EX PADS
6.0000 | MEDICATED_PAD | Freq: Every day | CUTANEOUS | Status: DC
  Administered 2021-05-24 – 2021-06-02 (×10): 6 via TOPICAL

## 2021-05-24 NOTE — Progress Notes (Signed)
? ?   Attending Progress Note ? ?History: Kerri Carter is here for adjacent segment segment disease with spondylolisthesis in the lumbar spine as well as lumbar radiculopathy. ? ?POD1: Expected post-op back pain.  Her legs feel better. ? ?Physical Exam: ?Vitals:  ? 05/24/21 0831 05/24/21 0946  ?BP: (!) 112/56 (!) 123/52  ?Pulse: 94 98  ?Resp: 16   ?Temp: 98.2 ?F (36.8 ?C)   ?SpO2: 98%   ? ? ?AA Ox3 ?CNI ? ?Strength:5/5 throughout BLE ? ?Drains ?Deep 230 ?Superficial 13 ? ? ?Data: ? ?Recent Labs  ?Lab 05/24/21 ?0356  ?CREATININE 1.22*  ? ?No results for input(s): AST, ALT, ALKPHOS in the last 168 hours. ? ?Invalid input(s): TBILI  ? Recent Labs  ?Lab 05/23/21 ?1447  ?WBC 15.8*  ?HGB 9.9*  ?HCT 32.3*  ?PLT 259  ? ?No results for input(s): APTT, INR in the last 168 hours.  ?   ? ? ?Other tests/results: n/a ? ?Assessment/Plan: ? ?GLADYCE MCRAY is doing well after extension of lumbar fusion to L2. ? ?- mobilize ?- pain control ?- DVT prophylaxis ?- PTOT ?- Will remove foley on 4/23 ? ? ?Meade Maw MD, MPHS ?Department of Neurosurgery ? ? ? ?

## 2021-05-24 NOTE — Evaluation (Signed)
Physical Therapy Evaluation ?Patient Details ?Name: Kerri Carter ?MRN: [id number] ?DOB: 1940/04/10 ?Today's Date: 05/24/2021 ? ?History of Present Illness ? Pt is a 81 y/o F admitted on who presents with spondylithesis and lumbar radiculopathy and has failed conservative management. Pt is now s/p L 3/4 & 2/3 fusion with posterolateral arthrodesis from L2 to L5. PMH: anxiety, aortic stenosis, asthma, CKD, CVA, depression, GERD/PUD, retention, interstitial lung disease, PAD, bipolar disorder, conversion disorder, heart murmur, HLD  ?Clinical Impression ? Pt seen for PT evaluation with pt verbose throughout session, reporting she lives with her grandson but is alone during the day & limits mobility 2/2 extensive hx of falling. On this date, PT educates pt on back precautions & attempts to don TLSO for comfort but brace does not fit - asked secretary to contact vendor re: brace fit. Pt is able to complete bed mobility with CGA & cuing for log rolling, use of bed rails, & HOB slightly elevated. Pt is able to complete STS & STP to recliner with RW & min assist with pt demonstrating decreased balance during mobility & increased pain with position changes. Educated pt on importance of OOB mobility, at minimum for meals. Also discussed recommendation of STR & pt agreeable.   ?   ? ?Recommendations for follow up therapy are one component of a multi-disciplinary discharge planning process, led by the attending physician.  Recommendations may be updated based on patient status, additional functional criteria and insurance authorization. ? ?Follow Up Recommendations Skilled nursing-short term rehab (<3 hours/day) ? ?  ?Assistance Recommended at Discharge Frequent or constant Supervision/Assistance  ?Patient can return home with the following ? A lot of help with walking and/or transfers;A lot of help with bathing/dressing/bathroom;Direct supervision/assist for financial management;Assistance with cooking/housework;Help with stairs  or ramp for entrance ? ?  ?Equipment Recommendations  (TBD in next venue)  ?Recommendations for Other Services ?    ?  ?Functional Status Assessment Patient has had a recent decline in their functional status and demonstrates the ability to make significant improvements in function in a reasonable and predictable amount of time.  ? ?  ?Precautions / Restrictions Precautions ?Precautions: Fall;Back ?Precaution Booklet Issued: No ?Required Braces or Orthoses: Spinal Brace ?Spinal Brace: Thoracolumbosacral orthotic;Applied in sitting position (for comfort only) ?Restrictions ?Weight Bearing Restrictions: No  ? ?  ? ?Mobility ? Bed Mobility ?Overal bed mobility: Needs Assistance ?Bed Mobility: Rolling, Sidelying to Sit ?Rolling: Supervision ?Sidelying to sit: Min guard ?  ?  ?  ?General bed mobility comments: cuing for log rolling, HOB moderately elevated, use of bed rails ?  ? ?Transfers ?Overall transfer level: Needs assistance ?Equipment used: Rolling walker (2 wheels) ?Transfers: Sit to/from Stand, Bed to chair/wheelchair/BSC ?Sit to Stand: Min assist ?  ?Step pivot transfers: Min assist ?  ?  ?  ?General transfer comment: Cuing for hand placement, pain limited during stand pivot transfer. ?  ? ?Ambulation/Gait ?  ?  ?  ?  ?  ?  ?  ?  ? ?Stairs ?  ?  ?  ?  ?  ? ?Wheelchair Mobility ?  ? ?Modified Rankin (Stroke Patients Only) ?  ? ?  ? ?Balance Overall balance assessment: Needs assistance, History of Falls ?Sitting-balance support: Feet supported, Bilateral upper extremity supported ?Sitting balance-Leahy Scale: Fair ?Sitting balance - Comments: close supervision static sitting EOB ?  ?Standing balance support: Reliant on assistive device for balance, Bilateral upper extremity supported, During functional activity ?Standing balance-Leahy Scale: Poor ?  ?  ?  ?  ?  ?  ?  ?  ?  ?  ?  ?  ?   ? ? ? ?  Pertinent Vitals/Pain Pain Assessment ?Pain Assessment: 0-10 ?Pain Score: 8  ?Pain Location: back ?Pain Descriptors /  Indicators: Discomfort, Grimacing, Guarding ?Pain Intervention(s): Monitored during session, Repositioned, RN gave pain meds during session  ? ? ?Home Living Family/patient expects to be discharged to:: Private residence ?Living Arrangements: Other (Comment) ?Available Help at Discharge: Family;Available PRN/intermittently ?Type of Home: House ?Home Access: Ramped entrance ?  ?  ?  ?Home Layout: One level ?Home Equipment: Rollator (4 wheels);BSC/3in1;Shower seat;Toilet riser;Grab bars - toilet;Hand held shower head;Grab bars - tub/shower;Adaptive equipment (lift chair) ?Additional Comments: Pt lives with grandchildren who work during the day.  ?  ?Prior Function Prior Level of Function : Independent/Modified Independent;History of Falls (last six months) ?  ?  ?  ?  ?  ?  ?Mobility Comments: Pt reports she stays in bed or ambulates ~5 ft at most to lift chair & BSC. Doesn't ambulate farther than that during the day because she & grandson are fearful of her falling (pt reports excessive number of falls in the past 6 months). ?ADLs Comments: Reports she either eats a pack of crackers for lunch or HHPT/HHOT will fix her something at the end of their session prior to their departure. ?  ? ? ?Hand Dominance  ?   ? ?  ?Extremity/Trunk Assessment  ? Upper Extremity Assessment ?Upper Extremity Assessment: Overall WFL for tasks assessed ?  ? ?Lower Extremity Assessment ?Lower Extremity Assessment:  (3/5 BLE knee extension in sitting, not formally tested 2/2 pain) ?  ? ?Cervical / Trunk Assessment ?Cervical / Trunk Assessment: Back Surgery  ?Communication  ? Communication: HOH  ?Cognition Arousal/Alertness: Awake/alert ?Behavior During Therapy: [facility] for tasks assessed/performed, Impulsive ?Overall Cognitive Status: Within Functional Limits for tasks assessed ?  ?  ?  ?  ?  ?  ?  ?  ?  ?  ?  ?  ?  ?  ?  ?  ?General Comments: Pt verbose in conversation. ?  ?  ? ?  ?General Comments General comments (skin integrity, edema, etc.):  PT attempts to don TLSO but brace appears to small, requested secretary contact vendor for brace fitting (pt reports no one fit her for brace yesterday when it was dropped off). Educated pt on use of TLSO for comfort but brace not utilized during session 2/2 poor fit. Educated pt on back precautions. ? ?  ?Exercises    ? ?Assessment/Plan  ?  ?PT Assessment Patient needs continued PT services  ?PT Problem List Decreased strength;Decreased mobility;Decreased safety awareness;Decreased knowledge of precautions;Decreased activity tolerance;Cardiopulmonary status limiting activity;Decreased knowledge of use of DME;Decreased balance;Pain ? ?   ?  ?PT Treatment Interventions DME instruction;Therapeutic activities;Modalities;Gait training;Therapeutic exercise;Patient/family education;Stair training;Balance training;Functional mobility training;Neuromuscular re-education;Manual techniques   ? ?PT Goals (Current goals can be found in the Care Plan section)  ?Acute Rehab PT Goals ?Patient Stated Goal: decreased pain ?PT Goal Formulation: With patient ?Time For Goal Achievement: 06/07/21 ?Potential to Achieve Goals: Fair ? ?  ?Frequency BID ?  ? ? ?Co-evaluation   ?  ?  ?  ?  ? ? ?  ?AM-PAC PT "6 Clicks" Mobility  ?Outcome Measure Help needed turning from your back to your side while in a flat bed without using bedrails?: A Little ?Help needed moving from lying on your back to sitting on the side of a flat bed without using bedrails?: A Lot ?Help needed moving to and from a bed to a chair (including a wheelchair)?: A Little ?Help needed standing  up from a chair using your arms (e.g., wheelchair or bedside chair)?: A Little ?Help needed to walk in hospital room?: A Lot ?Help needed climbing 3-5 steps with a railing? : Total ?6 Click Score: 14 ? ?  ?End of Session Equipment Utilized During Treatment: Oxygen (Pt on supplemental oxygen at beginning of session when lying in bed, reports she uses it at night at baseline only. Doffed  O2 for mobility.) ?Activity Tolerance: Patient limited by pain ?Patient left: in chair;with chair alarm set;with call bell/phone within reach ?Nurse Communication: Mobility status ?PT Visit Diagnosis: U

## 2021-05-24 NOTE — NC FL2 (Signed)
?[place] MEDICAID FL2 LEVEL OF CARE SCREENING TOOL  ?  ? ?IDENTIFICATION  ?Patient Name: ?Kerri Carter Birthdate: 06-12-1940 Sex: female Admission Date (Current Location): ?05/23/2021  ?[place] and [place] Number: ? [place] ?  Facility and Address:  ?[facility], [address], [place], [place] [address] ?     Provider Number: ?[id number]  ?Attending Physician Name and Address:  ?Meade Maw, MD ? Relative Name and Phone Number:  ?Gilland,Dean Pandora Leiter)   [phone] [facility]) ?   ?Current Level of Care: ?Hospital Recommended Level of Care: ?[place] Prior Approval Number: ?  ? ?Date Approved/Denied: ?  PASRR Number: ?[id number] A ? ?Discharge Plan: ?  ?  ? ?Current Diagnoses: ?Patient Active Problem List  ? Diagnosis Date Noted  ? S/P lumbar fusion 05/23/2021  ? Physical deconditioning 02/27/2021  ? Leg pain, bilateral   ? Respiratory failure with hypoxia ([place]) 01/12/2021  ? Acute respiratory failure with hypoxia ([place]) 01/12/2021  ? Frequent falls 12/18/2020  ? Hip pain 12/18/2020  ? Acute pain of left knee 12/18/2020  ? Elevated brain natriuretic peptide (BNP) level 12/18/2020  ? Weakness of both lower extremities 12/18/2020  ? Assistance needed for mobility 12/18/2020  ? Impaired mobility 12/18/2020  ? Urinary incontinence 12/18/2020  ? Pleural effusion on left 12/18/2020  ? Lumbar radiculopathy 12/11/2020  ? COPD exacerbation ([place]) 10/29/2020  ? Acromial process of scapula fracture 12/21/2019  ? NSIP (nonspecific interstitial pneumonitis) ([place]) 12/08/2019  ? Iliopsoas bursitis of right hip 08/29/2019  ? Chronic left shoulder pain 08/29/2019  ? Overweight (BMI 25.0-29.9) 08/29/2019  ? Hypertriglyceridemia 07/24/2019  ? Bipolar disorder, in full remission, most recent episode depressed ([place]) 07/13/2019  ? Essential hypertension 06/16/2019  ? Chronic pain 06/16/2019  ? Lymphedema 06/05/2019  ? Chronic venous insufficiency 05/25/2019  ? PAD (peripheral artery disease)  ([place]) 05/25/2019  ? Leg edema 05/02/2019  ? Episodic mood disorder ([place]) 04/18/2019  ? Complicated grief 71/24/5809  ? At high risk for falls 03/16/2019  ? Pelvic mass in female 03/16/2019  ? Compression fracture of L4 vertebra ([place]) 02/21/2019  ? Collapsed vertebra, not elsewhere classified, lumbar region, initial encounter for fracture ([place]) 02/21/2019  ? Anxiety 01/20/2019  ? Pain due to onychomycosis of toenails of both feet 12/05/2018  ? Hav (hallux abducto valgus), unspecified laterality 12/05/2018  ? Closed fracture of acromial process of scapula 10/28/2018  ? Status post reverse arthroplasty of shoulder, left 07/26/2018  ? Interstitial pulmonary disease ([place]) 07/07/2018  ? Fatigue 07/07/2018  ? Avascular necrosis of left shoulder due to adverse effect of steroid therapy ([place]) 01/20/2018  ? Other shoulder lesions, left shoulder 12/20/2017  ? Status post reverse total shoulder replacement, right 11/04/2017  ? Leukocytosis 10/19/2017  ? Allergic rhinitis 09/28/2017  ? Rotator cuff tendinitis, right 07/26/2017  ? Strain of right hip 02/26/2017  ? History of fracture of left hip 01/15/2017  ? Status post lumbar spinal fusion 01/15/2017  ? Dislocation of hip joint prosthesis ([place]) 01/08/2017  ? Status post hip hemiarthroplasty 01/08/2017  ? Leg swelling 12/15/2016  ? Age-related osteoporosis without current pathological fracture 11/05/2016  ? Hip fracture ([place]) 10/31/2016  ? Polyneuropathy 10/28/2016  ? Left foot pain 10/19/2016  ? Spinal stenosis of lumbar region 09/15/2016  ? Osteoporosis 08/27/2016  ? Drug-induced osteoporosis 08/27/2016  ? Adnexal mass 08/25/2016  ? Prediabetes 08/25/2016  ? Coronary atherosclerosis 08/25/2016  ? History of syncope 08/25/2016  ? Hypokalemia 08/25/2016  ? Mixed bipolar I disorder ([place]) 10/09/2015  ?  Stage 3b chronic kidney disease ([place])   ? Conversion disorder 08/04/2015  ? Hyperlipidemia 07/17/2015  ? 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 Macular  degeneration 07/17/2015  ? Insomnia 07/17/2015  ? Rheumatoid arthritis ([place]) 12/05/2014  ? Other specified postprocedural states 07/20/2013  ? Anxiety and depression 06/13/2013  ? Osteoarthritis of knee 06/07/2013  ? Cough 05/02/2012  ? SOB (shortness of breath) 10/13/2011  ? Valvular heart disease 10/13/2011  ? Asthma-COPD overlap syndrome ([place]) 09/09/2011  ? OSA (obstructive sleep apnea) 09/09/2011  ? Nocturnal hypoxemia 09/09/2011  ? Gastroesophageal reflux disease 02/24/2011  ? Single kidney 02/24/2011  ? H/O cardiac catheterization 02/24/2011  ? Renal artery stenosis ([place]) 02/24/2011  ? ? ?Orientation RESPIRATION BLADDER Height & Weight   ?  ?Self, Time, Situation, Place ? O2 Indwelling catheter Weight: 173 lb (78.5 kg) ?Height:  5' 1.5" (156.2 cm)  ?BEHAVIORAL SYMPTOMS/MOOD NEUROLOGICAL BOWEL NUTRITION STATUS  ?      Diet (regular)  ?AMBULATORY STATUS COMMUNICATION OF NEEDS Skin   ?Limited Assist Verbally Wound Vac, Surgical wounds ?  ?  ?  ?    ?     ?     ? ? ?Personal Care Assistance Level of Assistance  ?Bathing, Feeding, Dressing Bathing Assistance: Limited assistance ?Feeding assistance: Limited assistance ?Dressing Assistance: Limited assistance ?   ? ?Functional Limitations Info  ?    ?  ?   ? ? ?SPECIAL CARE FACTORS FREQUENCY  ?PT (By licensed PT), OT (By licensed OT)   ?  ?PT Frequency: 5 times per week ?OT Frequency: 5 times per week ?  ?  ?  ?   ? ? ?Contractures    ? ? ?Additional Factors Info  ?Code Status, Allergies Code Status Info: full ?Allergies Info: Ceftin (Cefuroxime Axetil), Lisinopril, Sulfa Antibiotics, Sulfasalazine, Morphine, Xarelto (Rivaroxaban), Adhesive (Tape), Antihistamines, Chlorpheniramine-type, Antivert (Meclizine Hcl), Aspirin, Contrast Media (Iodinated Contrast Media), Decongestant (Pseudoephedrine Hcl), Doxycycline, Levaquin (Levofloxacin In D5w), Polymyxin B, Tetanus Toxoids ?  ?  ?  ?   ? ?Current Medications (05/24/2021):  This is the current hospital active medication  list ?Current Facility-Administered Medications  ?Medication Dose Route Frequency Provider Last Rate Last Admin  ? 0.9 %  sodium chloride infusion  250 mL Intravenous Continuous Loleta Dicker, PA      ? 0.9 %  sodium chloride infusion   Intravenous Continuous Loleta Dicker, PA   Stopped at 05/24/21 0740  ? acetaminophen (TYLENOL) tablet 650 mg  650 mg Oral Q4H PRN Loleta Dicker, PA      ? Or  ? acetaminophen (TYLENOL) suppository 650 mg  650 mg Rectal Q4H PRN Loleta Dicker, PA      ? acetaminophen (TYLENOL) tablet 1,000 mg  1,000 mg Oral Q6H Loleta Dicker, [place]   1,000 mg at 05/24/21 1256  ? albuterol (PROVENTIL) (2.5 MG/3ML) 0.083% nebulizer solution 2.5 mg  2.5 mg Inhalation Q6H PRN Loleta Dicker, PA      ? alum & mag hydroxide-simeth (MAALOX/MYLANTA) 200-200-20 MG/5ML suspension 30 mL  30 mL Oral Q4H PRN Meade Maw, MD   30 mL at 05/23/21 2104  ? amLODipine (NORVASC) tablet 5 mg  5 mg Oral BID Loleta Dicker, PA   5 mg at 05/23/21 2101  ? benzonatate (TESSALON) capsule 200 mg  200 mg Oral TID PRN Loleta Dicker, PA      ? bisacodyl (DULCOLAX) suppository 10 mg  10 mg Rectal Daily PRN Derrill Kay,  Luciano Cutter, PA      ? budesonide (PULMICORT) nebulizer solution 0.25 mg  0.25 mg Nebulization BID Loleta Dicker, PA   0.25 mg at 05/24/21 3976  ? cholecalciferol (VITAMIN D3) tablet 4,000 Units  4,000 Units Oral Daily Loleta Dicker, [place]   4,000 Units at 05/24/21 7341  ? dicyclomine (BENTYL) capsule 20 mg  20 mg Oral BID Loleta Dicker, [place]   20 mg at 05/24/21 9379  ? docusate sodium (COLACE) capsule 100 mg  100 mg Oral BID Loleta Dicker, PA   100 mg at 05/24/21 0240  ? enoxaparin (LOVENOX) injection 40 mg  40 mg Subcutaneous Q24H Loleta Dicker, [place]      ? escitalopram (LEXAPRO) tablet 10 mg  10 mg Oral Daily Loleta Dicker, [place]   10 mg at 05/24/21 9735  ? famotidine (PEPCID) tablet 20 mg  20 mg Oral Daily Loleta Dicker, [place]   20 mg at 05/24/21 3299  ?  fluticasone (FLONASE) 50 MCG/ACT nasal spray 2 spray  2 spray Each Nare Daily Loleta Dicker, [place]   2 spray at 05/24/21 2426  ? gabapentin (NEURONTIN) capsule 600 mg  600 mg Oral BID Loleta Dicker, P

## 2021-05-24 NOTE — TOC Initial Note (Signed)
Transition of Care (TOC) - Initial/Assessment Note  ? ? ?Patient Details  ?Name: Kerri Carter ?MRN: [id number] ?Date of Birth: 1940-10-24 ? ?Transition of Care (TOC) CM/SW Contact:    ?Brianni Manthe E Richrd Kuzniar, LCSW ?Phone Number: ?05/24/2021, 2:35 PM ? ?Clinical Narrative:                CSW spoke with patient regarding SNF recommendation.  ?Patient lives with her grandson, his wife, and his children. Family provides transportation. PCP is Dr. Terese Door. [facility]acy [facility]. Patient has oxygen, a lift chair, a rollator, and a shower chair. Patient wants to see about getting a new shower chair when she leaves rehab.  ?Patient went to [facility] in the past and would like to return there for short term rehab. She asked CSW to request she be on [address] near the therapy gym. CSW reached out to [facility] and asked her to review referral and informed her of request. Waiting to hear back from [facility].  ?Will need to start auth when appropriate.  ? ? ?Expected Discharge Plan: [place] ?Barriers to Discharge: Continued Medical Work up ? ? ?Patient Goals and CMS Choice ?Patient states their goals for this hospitalization and ongoing recovery are:: SNF ?CMS Medicare.gov Compare Post Acute Care list provided to:: Patient ?Choice offered to / list presented to : Patient ? ?Expected Discharge Plan and Services ?Expected Discharge Plan: [place] ?  ?  ?  ?Living arrangements for the past 2 months: [place] ?                ?  ?  ?  ?  ?  ?  ?  ?  ?  ?  ? ?Prior Living Arrangements/Services ?Living arrangements for the past 2 months: [place] ?Lives with:: Relatives ?Patient language and need for interpreter reviewed:: Yes ?Do you feel safe going back to the place where you live?: Yes      ?Need for Family Participation in Patient Care: Yes (Comment) ?Care giver support system in place?: Yes (comment) ?Current home services: DME ?Criminal Activity/Legal Involvement Pertinent to  Current Situation/Hospitalization: No - Comment as needed ? ?Activities of Daily Living ?Home Assistive Devices/Equipment: Wheelchair, Environmental consultant (specify type), Dentures (specify type), Oxygen ?ADL Screening (condition at time of admission) ?Patient's cognitive ability adequate to safely complete daily activities?: Yes ?Is the patient deaf or have difficulty hearing?: No ?Does the patient have difficulty seeing, even when wearing glasses/contacts?: No ?Does the patient have difficulty concentrating, remembering, or making decisions?: No ?Patient able to express need for assistance with ADLs?: Yes ?Does the patient have difficulty dressing or bathing?: No ?Independently performs ADLs?: Yes (appropriate for developmental age) ?Does the patient have difficulty walking or climbing stairs?: Yes ?Weakness of Legs: Both ?Weakness of Arms/Hands: None ? ?Permission Sought/Granted ?Permission sought to share information with : Customer service manager ?Permission granted to share information with : Yes, Verbal Permission Granted ?   ? Permission granted to share info w AGENCY: SNFs ?   ?   ? ?Emotional Assessment ?  ?  ?  ?Orientation: : Oriented to Self, Oriented to  Time, Oriented to Situation, Oriented to Place ?Alcohol / Substance Use: Not Applicable ?Psych Involvement: No (comment) ? ?Admission diagnosis:  S/P lumbar fusion [Z98.1] ?Patient Active Problem List  ? Diagnosis Date Noted  ? S/P lumbar fusion 05/23/2021  ? Physical deconditioning 02/27/2021  ? Leg pain, bilateral   ? Respiratory failure with hypoxia ([place]) 01/12/2021  ?  Acute respiratory failure with hypoxia ([place]) 01/12/2021  ? Frequent falls 12/18/2020  ? Hip pain 12/18/2020  ? Acute pain of left knee 12/18/2020  ? Elevated brain natriuretic peptide (BNP) level 12/18/2020  ? Weakness of both lower extremities 12/18/2020  ? Assistance needed for mobility 12/18/2020  ? Impaired mobility 12/18/2020  ? Urinary incontinence 12/18/2020  ? Pleural effusion on  left 12/18/2020  ? Lumbar radiculopathy 12/11/2020  ? COPD exacerbation ([place]) 10/29/2020  ? Acromial process of scapula fracture 12/21/2019  ? NSIP (nonspecific interstitial pneumonitis) ([place]) 12/08/2019  ? Iliopsoas bursitis of right hip 08/29/2019  ? Chronic left shoulder pain 08/29/2019  ? Overweight (BMI 25.0-29.9) 08/29/2019  ? Hypertriglyceridemia 07/24/2019  ? Bipolar disorder, in full remission, most recent episode depressed ([place]) 07/13/2019  ? Essential hypertension 06/16/2019  ? Chronic pain 06/16/2019  ? Lymphedema 06/05/2019  ? Chronic venous insufficiency 05/25/2019  ? PAD (peripheral artery disease) ([place]) 05/25/2019  ? Leg edema 05/02/2019  ? Episodic mood disorder ([place]) 04/18/2019  ? Complicated grief 11/94/1740  ? At high risk for falls 03/16/2019  ? Pelvic mass in female 03/16/2019  ? Compression fracture of L4 vertebra ([place]) 02/21/2019  ? Collapsed vertebra, not elsewhere classified, lumbar region, initial encounter for fracture ([place]) 02/21/2019  ? Anxiety 01/20/2019  ? Pain due to onychomycosis of toenails of both feet 12/05/2018  ? Hav (hallux abducto valgus), unspecified laterality 12/05/2018  ? Closed fracture of acromial process of scapula 10/28/2018  ? Status post reverse arthroplasty of shoulder, left 07/26/2018  ? Interstitial pulmonary disease ([place]) 07/07/2018  ? Fatigue 07/07/2018  ? Avascular necrosis of left shoulder due to adverse effect of steroid therapy ([place]) 01/20/2018  ? Other shoulder lesions, left shoulder 12/20/2017  ? Status post reverse total shoulder replacement, right 11/04/2017  ? Leukocytosis 10/19/2017  ? Allergic rhinitis 09/28/2017  ? Rotator cuff tendinitis, right 07/26/2017  ? Strain of right hip 02/26/2017  ? History of fracture of left hip 01/15/2017  ? Status post lumbar spinal fusion 01/15/2017  ? Dislocation of hip joint prosthesis ([place]) 01/08/2017  ? Status post hip hemiarthroplasty 01/08/2017  ? Leg swelling 12/15/2016  ? Age-related osteoporosis without  current pathological fracture 11/05/2016  ? Hip fracture ([place]) 10/31/2016  ? Polyneuropathy 10/28/2016  ? Left foot pain 10/19/2016  ? Spinal stenosis of lumbar region 09/15/2016  ? Osteoporosis 08/27/2016  ? Drug-induced osteoporosis 08/27/2016  ? Adnexal mass 08/25/2016  ? Prediabetes 08/25/2016  ? Coronary atherosclerosis 08/25/2016  ? History of syncope 08/25/2016  ? Hypokalemia 08/25/2016  ? Mixed bipolar I disorder ([place]) 10/09/2015  ? Stage 3b chronic kidney disease ([place])   ? Conversion disorder 08/04/2015  ? Hyperlipidemia 07/17/2015  ? 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 Macular degeneration 07/17/2015  ? Insomnia 07/17/2015  ? Rheumatoid arthritis ([place]) 12/05/2014  ? Other specified postprocedural states 07/20/2013  ? Anxiety and depression 06/13/2013  ? Osteoarthritis of knee 06/07/2013  ? Cough 05/02/2012  ? SOB (shortness of breath) 10/13/2011  ? Valvular heart disease 10/13/2011  ? Asthma-COPD overlap syndrome ([place]) 09/09/2011  ? OSA (obstructive sleep apnea) 09/09/2011  ? Nocturnal hypoxemia 09/09/2011  ? Gastroesophageal reflux disease 02/24/2011  ? Single kidney 02/24/2011  ? H/O cardiac catheterization 02/24/2011  ? Renal artery stenosis ([place]) 02/24/2011  ? ?PCP:  McLean-Scocuzza, Nino Glow, MD ?Pharmacy:   ?[id number] [place] [place] - [address] ?[address] ?[id number]?Phone: [phone]: [phone]? ?  [place], [place]. ?[address] ?[address] ?[place] [address] ?Phone: [phone] Fax: [phone] ? ?[id number] [place] [facility] [address] ?[address] ?[id number]?Phone: [phone]: [phone]? ? ? ? ?Social Determinants of Health (SDOH) Interventions ?  ? ?Readmission Risk Interventions ? ?  05/24/2021  ?  2:35 PM  ?Readmission Risk Prevention Plan  ?Transportation Screening Complete  ?PCP or Specialist Appt within  3-5 Days Complete  ?[facility] Consult Complete  ?Social Work Consult for [place]ridgetonPlanning/Counseling Complete  ?Palliative Care Screening Not Applicable  ?Medication Review (Press photographer Comp

## 2021-05-24 NOTE — Progress Notes (Signed)
Pt refused Lovenox, pt educated on the importance of getting Lovenox after surgery. Pt states " I don't want it". MD made aware.  ?

## 2021-05-25 NOTE — Progress Notes (Signed)
Physical Therapy Treatment ?Patient Details ?Name: Kerri Carter ?MRN: [id number] ?DOB: July 04, 1940 ?Today's Date: 05/25/2021 ? ? ?History of Present Illness Pt is a 81 y/o F admitted on who presents with spondylithesis and lumbar radiculopathy and has failed conservative management. Pt is now s/p L 3/4 & 2/3 fusion with posterolateral arthrodesis from L2 to L5. PMH: anxiety, aortic stenosis, asthma, CKD, CVA, depression, GERD/PUD, retention, interstitial lung disease, PAD, bipolar disorder, conversion disorder, heart murmur, HLD ? ?  ?PT Comments  ? ? Pt asleep in bed but awakens to voice.  She answers questions but keeps eyes closed most of session despite cues to open and engage.  She needs max cueing and assist to engage in ex.  Attempted EOB./OOB activities and she puts no effort into moving today.  Would get upper body slightly off bed then push back to return to supine.  Max education on need for mobility and engagement in PT session but she continues to resist today.  Pt did well yesterday with min assist.  She does seem to self limit today.  Will return in AM for continued mobility.  SNF remains appropriate for discharge. ?  ?Recommendations for follow up therapy are one component of a multi-disciplinary discharge planning process, led by the attending physician.  Recommendations may be updated based on patient status, additional functional criteria and insurance authorization. ? ?Follow Up Recommendations ? Skilled nursing-short term rehab (<3 hours/day) ?  ?  ?Assistance Recommended at Discharge Frequent or constant Supervision/Assistance  ?Patient can return home with the following A lot of help with walking and/or transfers;A lot of help with bathing/dressing/bathroom;Direct supervision/assist for financial management;Assistance with cooking/housework;Help with stairs or ramp for entrance;Assist for transportation ?  ?Equipment Recommendations ?    ?  ?Recommendations for Other Services   ? ? ?   ?Precautions / Restrictions Precautions ?Precautions: Fall;Back ?Precaution Booklet Issued: No ?Required Braces or Orthoses: Spinal Brace ?Spinal Brace: Thoracolumbosacral orthotic;Applied in sitting position (for comfort only) ?Restrictions ?Weight Bearing Restrictions: No  ?  ? ?Mobility ? Bed Mobility ?Overal bed mobility: Needs Assistance ?Bed Mobility: Supine to Sit ?Rolling: Total assist, Max assist ?  ?  ?  ?  ?General bed mobility comments: pt makes little to no assist to help today returning to supine quickly after mobility initated several times ?  ? ?Transfers ?  ?  ?  ?  ?  ?  ?  ?  ?  ?  ?  ? ?Ambulation/Gait ?  ?  ?  ?  ?  ?  ?  ?  ? ? ?Stairs ?  ?  ?  ?  ?  ? ? ?Wheelchair Mobility ?  ? ?Modified Rankin (Stroke Patients Only) ?  ? ? ?  ?Balance   ?  ?  ?  ?  ?  ?  ?  ?  ?  ?  ?  ?  ?  ?  ?  ?  ?  ?  ?  ? ?  ?Cognition Arousal/Alertness: Lethargic ?Behavior During Therapy: Flat affect ?Overall Cognitive Status: Within Functional Limits for tasks assessed ?  ?  ?  ?  ?  ?  ?  ?  ?  ?  ?  ?  ?  ?  ?  ?  ?General Comments: generally orientated but does call foley bag her purse and needs me to get the money out of it when pointing to blood pressure cuff on bedrail.  . ?  ?  ? ?  ?  Exercises Other Exercises ?Other Exercises: BLE AA/PROM in supine wtih poor engagement and asssit with ex. ? ?  ?General Comments   ?  ?  ? ?Pertinent Vitals/Pain Pain Assessment ?Pain Assessment: Faces ?Faces Pain Scale: Hurts whole lot ?Pain Location: back ?Pain Descriptors / Indicators: Discomfort, Grimacing, Guarding ?Pain Intervention(s): Monitored during session, Limited activity within patient's tolerance, Repositioned  ? ? ?Home Living   ?  ?  ?  ?  ?  ?  ?  ?  ?  ?   ?  ?Prior Function    ?  ?  ?   ? ?PT Goals (current goals can now be found in the care plan section) Progress towards PT goals: Not progressing toward goals - comment ? ?  ?Frequency ? ? ? BID ? ? ? ?  ?PT Plan Current plan remains appropriate   ? ? ?Co-evaluation   ?  ?  ?  ?  ? ?  ?AM-PAC PT "6 Clicks" Mobility   ?Outcome Measure ? Help needed turning from your back to your side while in a flat bed without using bedrails?: A Lot ?Help needed moving from lying on your back to sitting on the side of a flat bed without using bedrails?: A Lot ?Help needed moving to and from a bed to a chair (including a wheelchair)?: A Lot ?Help needed standing up from a chair using your arms (e.g., wheelchair or bedside chair)?: A Lot ?Help needed to walk in hospital room?: A Lot ?Help needed climbing 3-5 steps with a railing? : Total ?6 Click Score: 11 ? ?  ?End of Session Equipment Utilized During Treatment: Oxygen ?Activity Tolerance: Patient limited by lethargy;Patient limited by pain ?Patient left: in bed;with call bell/phone within reach;with bed alarm set ?Nurse Communication: Mobility status ?PT Visit Diagnosis: Unsteadiness on feet (R26.81);Pain;Muscle weakness (generalized) (M62.81);Difficulty in walking, not elsewhere classified (R26.2);History of falling (Z91.81);Repeated falls (R29.6) ?  ? ? ?Time: 3976-7341 ?PT Time Calculation (min) (ACUTE ONLY): 13 min ? ?Charges:  $Therapeutic Exercise: 8-22 mins          ?         Chesley Noon, PTA ?05/25/21, 11:01 AM ? ?

## 2021-05-25 NOTE — TOC Progression Note (Addendum)
Transition of Care (TOC) - Progression Note  ? ? ?Patient Details  ?Name: Kerri Carter ?MRN: [id number] ?Date of Birth: 02/09/1940 ? ?Transition of Care (TOC) CM/SW Contact  ?Akashdeep Chuba E Dollie Mayse, LCSW ?Phone Number: ?05/25/2021, 8:58 AM ? ?Clinical Narrative:   Reached out to [facility] again to ask to review referral. Will need insurance auth when we have a bed.  ? ?3:20- Still awaiting response from [facility].  ? ?Expected Discharge Plan: [place] ?Barriers to Discharge: Continued Medical Work up ? ?Expected Discharge Plan and Services ?Expected Discharge Plan: [place] ?  ?  ?  ?Living arrangements for the past 2 months: [place] ?                ?  ?  ?  ?  ?  ?  ?  ?  ?  ?  ? ? ?Social Determinants of Health (SDOH) Interventions ?  ? ?Readmission Risk Interventions ? ?  05/24/2021  ?  2:35 PM  ?Readmission Risk Prevention Plan  ?Transportation Screening Complete  ?PCP or Specialist Appt within 3-5 Days Complete  ?[place] or Home Care Consult Complete  ?Social Work Consult for [place] Planning/Counseling Complete  ?Palliative Care Screening Not Applicable  ?Medication Review Press photographer) Complete  ? ? ?

## 2021-05-25 NOTE — Progress Notes (Signed)
? ?   Attending Progress Note ? ?History: Kerri Carter is here for adjacent segment segment disease with spondylolisthesis in the lumbar spine as well as lumbar radiculopathy. ? ?POD2: Doing well with continued back pain.   ? ?POD1: Expected post-op back pain.  Her legs feel better. ? ?Physical Exam: ?Vitals:  ? 05/25/21 0718 05/25/21 0830  ?BP:  (!) 93/43  ?Pulse:  87  ?Resp:  15  ?Temp:  98.2 ?F (36.8 ?C)  ?SpO2: 92% 93%  ? ? ?AA Ox3 ?CNI ? ?Strength:5/5 throughout BLE ? ?Drains ?Deep 90 ?Superficial 60 ? ? ?Data: ? ?Recent Labs  ?Lab 05/24/21 ?0356  ?CREATININE 1.22*  ? ?No results for input(s): AST, ALT, ALKPHOS in the last 168 hours. ? ?Invalid input(s): TBILI  ? Recent Labs  ?Lab 05/23/21 ?1447  ?WBC 15.8*  ?HGB 9.9*  ?HCT 32.3*  ?PLT 259  ? ?No results for input(s): APTT, INR in the last 168 hours.  ?   ? ? ?Other tests/results: n/a ? ?Assessment/Plan: ? ?Kerri Carter is doing well after extension of lumbar fusion to L2. ? ?- mobilize ?- pain control ?- DVT prophylaxis - she has declined lovenox and understands the consequences ?- PTOT ?- Remove foley today ?- Continue drains - likely remove deep drain tomorrow. ? ? ?Meade Maw MD, MPHS ?Department of Neurosurgery ? ? ? ?

## 2021-05-26 ENCOUNTER — Inpatient Hospital Stay (HOSPITAL_COMMUNITY)
Admission: RE | Admit: 2021-05-26 | Discharge: 2021-05-26 | Disposition: A | Discharge status: Home / Self Care | Payer: Medicare PPO | Source: Ambulatory Visit | Attending: Family Medicine | Admitting: Family Medicine

## 2021-05-26 ENCOUNTER — Inpatient Hospital Stay: Payer: Medicare PPO

## 2021-05-26 DIAGNOSIS — N179 Acute kidney failure, unspecified: Secondary | ICD-10-CM

## 2021-05-26 DIAGNOSIS — J189 Pneumonia, unspecified organism: Secondary | ICD-10-CM

## 2021-05-26 DIAGNOSIS — I5031 Acute diastolic (congestive) heart failure: Secondary | ICD-10-CM

## 2021-05-26 DIAGNOSIS — F32A Depression, unspecified: Secondary | ICD-10-CM

## 2021-05-26 DIAGNOSIS — E875 Hyperkalemia: Secondary | ICD-10-CM

## 2021-05-26 DIAGNOSIS — J690 Pneumonitis due to inhalation of food and vomit: Secondary | ICD-10-CM | POA: Diagnosis not present

## 2021-05-26 DIAGNOSIS — I5043 Acute on chronic combined systolic (congestive) and diastolic (congestive) heart failure: Secondary | ICD-10-CM | POA: Diagnosis not present

## 2021-05-26 DIAGNOSIS — J9601 Acute respiratory failure with hypoxia: Secondary | ICD-10-CM

## 2021-05-26 DIAGNOSIS — R7989 Other specified abnormal findings of blood chemistry: Secondary | ICD-10-CM

## 2021-05-26 DIAGNOSIS — Z981 Arthrodesis status: Secondary | ICD-10-CM

## 2021-05-26 DIAGNOSIS — F419 Anxiety disorder, unspecified: Secondary | ICD-10-CM

## 2021-05-26 HISTORY — DX: Hyperkalemia: E87.5

## 2021-05-26 HISTORY — DX: Chronic kidney disease, unspecified: N17.9

## 2021-05-26 HISTORY — DX: Other specified abnormal findings of blood chemistry: R79.89

## 2021-05-26 HISTORY — DX: Anxiety disorder, unspecified: F41.9

## 2021-05-26 HISTORY — DX: Pneumonia, unspecified organism: J18.9

## 2021-05-26 HISTORY — DX: Depression, unspecified: F32.A

## 2021-05-26 HISTORY — DX: Acute on chronic combined systolic (congestive) and diastolic (congestive) heart failure: I50.43

## 2021-05-26 LAB — TROPONIN I (HIGH SENSITIVITY)
Troponin I (High Sensitivity): 52 ng/L — ABNORMAL HIGH (ref <18)
Troponin I (High Sensitivity): 61 ng/L — ABNORMAL HIGH (ref <18)
Troponin I (High Sensitivity): 79 ng/L — ABNORMAL HIGH (ref <18)
Troponin I (High Sensitivity): 69 ng/L — ABNORMAL HIGH (ref <18)

## 2021-05-26 LAB — CBC WITH DIFFERENTIAL/PLATELET
Abs Immature Granulocytes: 0.49 10*3/uL — ABNORMAL HIGH (ref 0.00–0.07)
Basophils Absolute: 0.2 10*3/uL — ABNORMAL HIGH (ref 0.0–0.1)
Basophils Relative: 1 %
Eosinophils Absolute: 0.1 10*3/uL (ref 0.0–0.5)
Eosinophils Relative: 1 %
HCT: 32 % — ABNORMAL LOW (ref 36.0–46.0)
Hemoglobin: 9.7 g/dL — ABNORMAL LOW (ref 12.0–15.0)
Immature Granulocytes: 2 %
Lymphocytes Relative: 13 %
Lymphs Abs: 3.3 10*3/uL (ref 0.7–4.0)
MCH: 26.8 pg (ref 26.0–34.0)
MCHC: 30.3 g/dL (ref 30.0–36.0)
MCV: 88.4 fL (ref 80.0–100.0)
Monocytes Absolute: 2.2 10*3/uL — ABNORMAL HIGH (ref 0.1–1.0)
Monocytes Relative: 9 %
Neutro Abs: 18.9 10*3/uL — ABNORMAL HIGH (ref 1.7–7.7)
Neutrophils Relative %: 74 %
Platelets: 250 10*3/uL (ref 150–400)
RBC: 3.62 MIL/uL — ABNORMAL LOW (ref 3.87–5.11)
RDW: 14.9 % (ref 11.5–15.5)
WBC: 25.1 10*3/uL — ABNORMAL HIGH (ref 4.0–10.5)
nRBC: 0.2 % (ref 0.0–0.2)

## 2021-05-26 LAB — ECHOCARDIOGRAM COMPLETE
AR max vel: 0.81 cm2
AV Area VTI: 0.74 cm2
AV Area mean vel: 0.74 cm2
AV Mean grad: 18 mmHg
AV Peak grad: 28.5 mmHg
Ao pk vel: 2.67 m/s
Area-P 1/2: 2.74 cm2
Height: 61.5 in
MV VTI: 0.75 cm2
P 1/2 time: 292 msec
S' Lateral: 3.01 cm
Weight: 2768 oz

## 2021-05-26 LAB — BASIC METABOLIC PANEL
Anion gap: 6 (ref 5–15)
BUN: 34 mg/dL — ABNORMAL HIGH (ref 8–23)
CO2: 27 mmol/L (ref 22–32)
Calcium: 8.1 mg/dL — ABNORMAL LOW (ref 8.9–10.3)
Chloride: 102 mmol/L (ref 98–111)
Creatinine, Ser: 1.65 mg/dL — ABNORMAL HIGH (ref 0.44–1.00)
GFR, Estimated: 32 mL/min — ABNORMAL LOW (ref >60)
Glucose, Bld: 142 mg/dL — ABNORMAL HIGH (ref 70–99)
Potassium: 5.6 mmol/L — ABNORMAL HIGH (ref 3.5–5.1)
Sodium: 135 mmol/L (ref 135–145)

## 2021-05-26 LAB — BLOOD GAS, ARTERIAL
Acid-Base Excess: 6 mmol/L — ABNORMAL HIGH (ref 0.0–2.0)
Bicarbonate: 30.6 mmol/L — ABNORMAL HIGH (ref 20.0–28.0)
O2 Content: 10 L/min
O2 Saturation: 90.7 %
Patient temperature: 37
pCO2 arterial: 43 mmHg (ref 32–48)
pH, Arterial: 7.46 — ABNORMAL HIGH (ref 7.35–7.45)
pO2, Arterial: 60 mmHg — ABNORMAL LOW (ref 83–108)

## 2021-05-26 LAB — SURGICAL PCR SCREEN
MRSA, PCR: NEGATIVE
Staphylococcus aureus: NEGATIVE

## 2021-05-26 LAB — GLUCOSE, CAPILLARY
Glucose-Capillary: 131 mg/dL — ABNORMAL HIGH (ref 70–99)
Glucose-Capillary: 147 mg/dL — ABNORMAL HIGH (ref 70–99)

## 2021-05-26 LAB — D-DIMER, QUANTITATIVE: D-Dimer, Quant: 1.52 ug/mL-FEU — ABNORMAL HIGH (ref 0.00–0.50)

## 2021-05-26 LAB — BRAIN NATRIURETIC PEPTIDE: B Natriuretic Peptide: 995.2 pg/mL — ABNORMAL HIGH (ref 0.0–100.0)

## 2021-05-26 LAB — PROCALCITONIN: Procalcitonin: 0.85 ng/mL

## 2021-05-26 MED ORDER — GUAIFENESIN ER 600 MG PO TB12
600.0000 mg | ORAL_TABLET | Freq: Two times a day (BID) | ORAL | Status: DC
  Administered 2021-05-26 – 2021-06-02 (×14): 600 mg via ORAL
  Filled 2021-05-26 (×14): qty 1

## 2021-05-26 MED ORDER — SODIUM CHLORIDE 0.9 % IV SOLN
3.0000 g | Freq: Two times a day (BID) | INTRAVENOUS | Status: DC
  Administered 2021-05-26 – 2021-05-29 (×6): 3 g via INTRAVENOUS
  Filled 2021-05-26 (×2): qty 3
  Filled 2021-05-26 (×3): qty 8
  Filled 2021-05-26: qty 3
  Filled 2021-05-26: qty 8
  Filled 2021-05-26: qty 3

## 2021-05-26 MED ORDER — AZITHROMYCIN 250 MG PO TABS
500.0000 mg | ORAL_TABLET | Freq: Every day | ORAL | Status: DC
  Administered 2021-05-26: 500 mg via ORAL
  Filled 2021-05-26: qty 1

## 2021-05-26 MED ORDER — SODIUM ZIRCONIUM CYCLOSILICATE 10 G PO PACK
10.0000 g | PACK | Freq: Once | ORAL | Status: AC
  Administered 2021-05-26: 10 g via ORAL
  Filled 2021-05-26: qty 1

## 2021-05-26 MED ORDER — METHYLPREDNISOLONE SODIUM SUCC 40 MG IJ SOLR
20.0000 mg | Freq: Two times a day (BID) | INTRAMUSCULAR | Status: DC
  Administered 2021-05-26 – 2021-05-30 (×8): 20 mg via INTRAVENOUS
  Filled 2021-05-26 (×8): qty 1

## 2021-05-26 MED ORDER — CHLORHEXIDINE GLUCONATE CLOTH 2 % EX PADS
6.0000 | MEDICATED_PAD | Freq: Every day | CUTANEOUS | Status: DC
  Administered 2021-05-28 – 2021-06-02 (×4): 6 via TOPICAL

## 2021-05-26 MED ORDER — FUROSEMIDE 10 MG/ML IJ SOLN
40.0000 mg | Freq: Once | INTRAMUSCULAR | Status: AC
  Administered 2021-05-26: 40 mg via INTRAVENOUS
  Filled 2021-05-26: qty 4

## 2021-05-26 MED ORDER — LEVOFLOXACIN IN D5W 750 MG/150ML IV SOLN: 750.0000 mg | INTRAVENOUS | Status: DC

## 2021-05-26 MED ORDER — IPRATROPIUM BROMIDE 0.02 % IN SOLN
0.5000 mg | Freq: Four times a day (QID) | RESPIRATORY_TRACT | Status: DC | PRN
  Administered 2021-05-28: 0.5 mg via RESPIRATORY_TRACT
  Filled 2021-05-26: qty 2.5

## 2021-05-26 MED ORDER — SODIUM CHLORIDE 0.9 % IV SOLN
500.0000 mg | Freq: Every day | INTRAVENOUS | Status: DC
  Administered 2021-05-27 – 2021-05-28 (×2): 500 mg via INTRAVENOUS
  Filled 2021-05-26: qty 5
  Filled 2021-05-26 (×2): qty 500

## 2021-05-26 MED ORDER — SODIUM CHLORIDE 0.9 % IV SOLN
3.0000 g | Freq: Two times a day (BID) | INTRAVENOUS | Status: DC
  Administered 2021-05-26: 3 g via INTRAVENOUS
  Filled 2021-05-26 (×2): qty 8

## 2021-05-26 MED ORDER — SODIUM CHLORIDE 0.9 % IV SOLN
500.0000 mg | INTRAVENOUS | Status: DC
  Filled 2021-05-26 (×2): qty 5

## 2021-05-26 MED ORDER — ENOXAPARIN SODIUM 30 MG/0.3ML IJ SOSY
30.0000 mg | PREFILLED_SYRINGE | INTRAMUSCULAR | Status: DC
Start: 2021-05-27 — End: 2021-05-29
  Administered 2021-05-27 – 2021-05-29 (×3): 30 mg via SUBCUTANEOUS
  Filled 2021-05-26 (×3): qty 0.3

## 2021-05-26 MED ORDER — LEVALBUTEROL HCL 1.25 MG/0.5ML IN NEBU
1.2500 mg | INHALATION_SOLUTION | Freq: Four times a day (QID) | RESPIRATORY_TRACT | Status: DC | PRN
  Administered 2021-05-26 – 2021-05-28 (×2): 1.25 mg via RESPIRATORY_TRACT
  Filled 2021-05-26 (×3): qty 0.5

## 2021-05-26 MED ORDER — TECHNETIUM TO 99M ALBUMIN AGGREGATED
4.0700 | Freq: Once | INTRAVENOUS | Status: AC
Start: 2021-05-26 — End: 2021-05-26
  Administered 2021-05-26: 4.07 via INTRAVENOUS

## 2021-05-26 MED ORDER — FUROSEMIDE 10 MG/ML IJ SOLN
40.0000 mg | Freq: Two times a day (BID) | INTRAMUSCULAR | Status: DC
  Administered 2021-05-26 – 2021-05-28 (×4): 40 mg via INTRAVENOUS
  Filled 2021-05-26 (×4): qty 4

## 2021-05-26 NOTE — Progress Notes (Addendum)
Pharmacy Antibiotic Note ? ?Kerri Carter is a 81 y.o. female admitted on 05/23/2021 with aspiration pneumonia.  Pharmacy has been consulted for Levaquin or Unasyn dosing.  Pt w/ med hx of ananaphylaxis to Ceftin, but has recently tolerated Augmentin in Oct 2022.  Will d/c Levaquin and proceed with Unasyn with Azithromycin. ? ?Plan: ?Unasyn 3 gm q12h x 5 days per indication and renal fxn.  Pharmacy will continue to follow and will adjust and/or extend abx as warranted. ? ?Height: 5' 1.5" (156.2 cm) ?Weight: 78.5 kg (173 lb) ?IBW/kg (Calculated) : 48.95 ? ?Temp (24hrs), Avg:98.2 ?F (36.8 ?C), Min:97.9 ?F (36.6 ?C), Max:98.8 ?F (37.1 ?C) ? ?Recent Labs  ?Lab 05/23/21 ?1447 05/24/21 ?0356 05/26/21 ?0442  ?WBC 15.8*  --  25.1*  ?CREATININE 1.25* 1.22* 1.65*  ?  ?Estimated Creatinine Clearance: 27 mL/min (A) (by C-G formula based on SCr of 1.65 mg/dL (H)).   ? ?Allergies  ?Allergen Reactions  ? Ceftin [Cefuroxime Axetil] Anaphylaxis  ? Lisinopril Anaphylaxis  ? Sulfa Antibiotics Other (See Comments)  ?  Face swelling  ? Sulfasalazine Anaphylaxis  ? Morphine Other (See Comments)  ?  Per patient, low blood pressure issues that requires action to raise it back up. ?Can take small infrequent doses  ? Xarelto [Rivaroxaban] Other (See Comments)  ?  Stomach burning, bleeding, and tar in stool  ? Adhesive [Tape] Rash  ?  Paper tape and tega derm OK  ? Antihistamines, Chlorpheniramine-Type Other (See Comments)  ?  Makes pt hyper  ? Antivert [Meclizine Hcl] Other (See Comments)  ?  Bladder will not empty  ? Aspirin Other (See Comments)  ?  Sulfasalazine allergy cross reacts  ? Contrast Media [Iodinated Contrast Media] Rash  ?  she is able to use betadine scrubs.  ? Decongestant [Pseudoephedrine Hcl] Other (See Comments)  ?  Makes pt hyper  ? Doxycycline Other (See Comments)  ?  GI upset  ? Levaquin [Levofloxacin In D5w] Rash  ? Polymyxin B Other (See Comments)  ?  Facial rash  ? Tetanus Toxoids Rash and Other (See Comments)  ?   Fever and hot to touch at injection site  ? ? ?Antimicrobials this admission: ?4/24 Unasyn >> x 5 days ?4/24 Azithromycin >> ? ?Microbiology results: ?4/24 BCx: Pending ?4/19 UCx: Multiple species, suggest recollection     ?4/24 Sputum: Pending  ? ?Thank you for allowing pharmacy to be a part of this patient?s care. ? ?Renda Rolls, PharmD, MBA ?05/26/2021 ?6:06 AM ? ? ?

## 2021-05-26 NOTE — Plan of Care (Signed)

## 2021-05-26 NOTE — Progress Notes (Signed)
PT Cancellation Note ? ?Patient Details ?Name: Kerri Carter ?MRN: [id number] ?DOB: November 06, 1940 ? ? ?Cancelled Treatment:    Reason Eval/Treat Not Completed: Medical issues which prohibited therapy ? ?Pt with hypoxic event requiring increased O2 support and further medical workup (check for DVT/PE) and tentative transfer to 2A.  Will hold PT session for today and continue tomorrow if appropriate - will check for COT orders.   ? ? ?Chesley Noon ?05/26/2021, 11:37 AM ?

## 2021-05-26 NOTE — Progress Notes (Signed)
Pt has been transferred to Nuclear med study, RN assisted the transfer as pt is currently on 15L of O2. All of the belongings were sent the her new room in 2A 254, Son was escorted to the new room. ?

## 2021-05-26 NOTE — TOC Progression Note (Signed)
Transition of Care (TOC) - Progression Note  ? ? ?Patient Details  ?Name: Kerri Carter ?MRN: [id number] ?Date of Birth: 07/21/1940 ? ?Transition of Care (TOC) CM/SW Contact  ?Conception Oms, RN ?Phone Number: ?05/26/2021, 10:43 AM ? ?Clinical Narrative:    ? ?Reviewed the bed offers with her Son And he chose [facility], She is supposed to use a CPAP at home, I explained he would need to bring it to [facility] from [facility], he stated understanding ? ?Expected Discharge Plan: [place] ?Barriers to Discharge: Continued Medical Work up ? ?Expected Discharge Plan and Services ?Expected Discharge Plan: [place] ?  ?  ?  ?Living arrangements for the past 2 months: [place] ?                ?  ?  ?  ?  ?  ?  ?  ?  ?  ?  ? ? ?Social Determinants of Health (SDOH) Interventions ?  ? ?Readmission Risk Interventions ? ?  05/24/2021  ?  2:35 PM  ?Readmission Risk Prevention Plan  ?Transportation Screening Complete  ?PCP or Specialist Appt within 3-5 Days Complete  ?[place] or Home Care Consult Complete  ?Social Work Consult for [place] Planning/Counseling Complete  ?Palliative Care Screening Not Applicable  ?Medication Review Press photographer) Complete  ? ? ?

## 2021-05-26 NOTE — Progress Notes (Signed)
Called for prn albuterol. Order was changed to xopenex. Patient with coarse rales throughout. O2 sats on 6 liters noted in mid 80's. High flow nasal cannula applied at 15 liters. Sats increased to 91%. Dr Mitchell Heir at bedside. Discussed need for bipap however patient may not tolerate as would not let me put neb mask on her for treatment. Has ordered lasix for this patient so will see how she responses  ?

## 2021-05-26 NOTE — Progress Notes (Signed)
Pt?s says went down to 80% on HHFNC ?D/w dr Mortimer Fries ? will restart IV unassuming to cover aspiration PNA ?Family deciding on code status. ?Transfer to stepdown.. orders per ICU NP

## 2021-05-26 NOTE — Consult Note (Signed)
PHARMACIST - PHYSICIAN COMMUNICATION ? ?CONCERNING:  Enoxaparin (Lovenox) for DVT Prophylaxis  ? ? ?RECOMMENDATION: ?Patient was prescribed enoxaprin '40mg'$  q24 hours for VTE prophylaxis.  ? Danley Danker Weights  ? 05/23/21 1841  ?Weight: 78.5 kg (173 lb)  ? ? ?Body mass index is 32.16 kg/m?. ? ?Estimated Creatinine Clearance: 27 mL/min (A) (by C-G formula based on SCr of 1.65 mg/dL (H)). ? ? ?Patient is candidate for enoxaparin '30mg'$  every 24 hours based on CrCl <10m/min or Weight <45kg ? ?DESCRIPTION: ?[facility] ? ?Patient is now receiving enoxaparin 30 mg every 24 hours  ? ? ?ADarrick Penna PharmD ?Clinical Pharmacist  ?05/26/2021 ?12:06 PM ? ?

## 2021-05-26 NOTE — Assessment & Plan Note (Signed)
Pain management per Dr. Cari Caraway. ?

## 2021-05-26 NOTE — Assessment & Plan Note (Addendum)
-   The patient be transferred to a progressive unit bed. ?- This is like secondary to a combination of acute CHF and aspiration pneumonia especially given elevated BNP and leukocytosis.. ?- O2 protocol will be followed. ?- She may need to have a BiPAP at ?- We will obtain an ABG. ?- RT consult was obtained. ?- DuoNebs were ordered. ?- VQ scan was ordered especially given elevated D-dimer. ?

## 2021-05-26 NOTE — Progress Notes (Signed)
Spoke with RN about patient. States patient is better. O2 saturation noted to be at 97% now on 15L HFNC. She turned her down to 10L and saturation remained around 94%. Will continue to monitor.  ?

## 2021-05-26 NOTE — Progress Notes (Signed)
? ?CHIEF COMPLAINT:  resp failure ? ?Subjective  ?Got called to bedside patient with worsening hypoxia ?Patient seemed to have aspirated after being placed on high flow ?Increased WOB ?Placed back on biPAP ?Patient looks critically ill  ?High risk for cardiac arrest and intubation ? ? ?  ? ?Objective  ? ? ? ?VITALS: ? height is 5' 1.5" (1.562 m) and weight is 78.5 kg. Her temperature is 98 ?F (36.7 ?C). Her blood pressure is 118/62 and her pulse is 78. Her respiration is 24 (abnormal) and oxygen saturation is 86% (abnormal).  ? ?I personally reviewed Labs under Results section. ? ? ? ? ?REVIEW OF SYSTEMS ? ?PATIENT IS UNABLE TO PROVIDE COMPLETE REVIEW OF SYSTEMS DUE TO SEVERE CRITICAL ILLNESS AND TOXIC METABOLIC ENCEPHALOPATHY ? ? ? ?PHYSICAL EXAMINATION: ? ?GENERAL:critically ill appearing, +resp distress ?EYES: Pupils equal, round, reactive to light.  No scleral icterus.  ?MOUTH: Moist mucosal membrane. INTUBATED ?NECK: Supple.  ?PULMONARY: +rhonchi, +wheezing ?CARDIOVASCULAR: S1 and S2.  No murmurs  ?GASTROINTESTINAL: Soft, nontender, -distended. Positive bowel sounds.  ?MUSCULOSKELETAL: +edema.  ?NEUROLOGIC: obtunded ?SKIN:intact,warm,dry ? ? ?Radiology Reports ?NM Pulmonary Perfusion ? ?Result Date: 05/26/2021 ?CLINICAL DATA:  Elevated D-dimer, COPD, asthma, acute hypoxia 3 days post lumbar fusion question pulmonary embolism EXAM: NUCLEAR MEDICINE PERFUSION LUNG SCAN TECHNIQUE: Perfusion images were obtained in multiple projections after intravenous injection of radiopharmaceutical. Ventilation scans intentionally deferred if perfusion scan and chest x-ray adequate for interpretation during COVID 19 epidemic. RADIOPHARMACEUTICALS:  4.07 mCi Tc-39mMAA IV COMPARISON:  Chest radiograph 05/26/2021 FINDINGS: Patchy nonsegmental diminished perfusion LEFT lower lobe, less severe than infiltrate on earlier radiograph. No segmental or subsegmental perfusion defects. Perfusion much better than expected based on chest  radiograph. IMPRESSION: Pulmonary embolism absent. Electronically Signed   By: MLavonia DanaM.D.   On: 05/26/2021 13:39  ? ?DG Chest Port 1 View ? ?Result Date: 05/26/2021 ?CLINICAL DATA:  COPD EXAM: PORTABLE CHEST 1 VIEW COMPARISON:  01/12/2021 FINDINGS: Hazy bilateral chest opacification. Normal heart size and unremarkable mediastinal contours for portable rotated chest. No visible pneumothorax. IMPRESSION: Bilateral pulmonary opacification with hazy appearance suggesting pleural fluid and atelectasis. Asymmetric rounded density on the left could be superimposed pneumonia. Electronically Signed   By: JJorje GuildM.D.   On: 05/26/2021 05:33  ? ?ECHOCARDIOGRAM COMPLETE ? ?Result Date: 05/26/2021 ?   ECHOCARDIOGRAM REPORT   Patient Name:   Kerri PESCHDate of Exam: 05/26/2021 Medical Rec #:  [id number]    Height:       61.5 in Accession #:    [id number]   Weight:       173.0 lb Date of Birth:  808-06-1940    BSA:          1.787 m? Patient Age:    89years      BP:           121/76 mmHg Patient Gender: F             HR:           95 bpm. Exam Location:  [facility] Procedure: 2D Echo, Color Doppler and Cardiac Doppler Indications:     I50.31 CHF Acute diastolic  History:         Patient has no prior history of Echocardiogram examinations.                  COPD, Aortic Valve Disease; Risk Factors:Former Smoker,  Dyslipidemia and Hypertension.  Sonographer:     Rosalia Hammers Referring Phys:  [id number] [place] Diagnosing Phys: Kathlyn Sacramento MD  Sonographer Comments: Suboptimal parasternal window and suboptimal subcostal window. Image acquisition challenging due to respiratory motion and Image acquisition challenging due to COPD. IMPRESSIONS  1. Left ventricular ejection fraction, by estimation, is 50 to 55%. The left ventricle has low normal function. Left ventricular endocardial border not optimally defined to evaluate regional wall motion. Left ventricular diastolic parameters are consistent with Grade II  diastolic dysfunction (pseudonormalization).  2. Right ventricular systolic function is normal. The right ventricular size is normal. Tricuspid regurgitation signal is inadequate for assessing PA pressure.  3. Left atrial size was mildly dilated.  4. The mitral valve is normal in structure. Severe mitral valve regurgitation. Mild mitral stenosis. The mean mitral valve gradient is 7.0 mmHg. Moderate mitral annular calcification.  5. The aortic valve was not well visualized. Aortic valve regurgitation is moderate. Mild to moderate aortic valve stenosis. Aortic valve mean gradient measures 18.0 mmHg.  6. The inferior vena cava is normal in size with greater than 50% respiratory variability, suggesting right atrial pressure of 3 mmHg.  7. Challenging image quality. Consider a TEE to evaluate mitral valve. FINDINGS  Left Ventricle: Left ventricular ejection fraction, by estimation, is 50 to 55%. The left ventricle has low normal function. Left ventricular endocardial border not optimally defined to evaluate regional wall motion. The left ventricular internal cavity  size was normal in size. There is no left ventricular hypertrophy. Left ventricular diastolic parameters are consistent with Grade II diastolic dysfunction (pseudonormalization). Right Ventricle: The right ventricular size is normal. No increase in right ventricular wall thickness. Right ventricular systolic function is normal. Tricuspid regurgitation signal is inadequate for assessing PA pressure. Left Atrium: Left atrial size was mildly dilated. Right Atrium: Right atrial size was normal in size. Pericardium: There is no evidence of pericardial effusion. Mitral Valve: The mitral valve is normal in structure. Moderate mitral annular calcification. Severe mitral valve regurgitation. Mild mitral valve stenosis. MV peak gradient, 17.3 mmHg. The mean mitral valve gradient is 7.0 mmHg. Tricuspid Valve: The tricuspid valve is normal in structure. Tricuspid valve  regurgitation is not demonstrated. No evidence of tricuspid stenosis. Aortic Valve: The aortic valve was not well visualized. Aortic valve regurgitation is moderate. Aortic regurgitation PHT measures 292 msec. Mild to moderate aortic stenosis is present. Aortic valve mean gradient measures 18.0 mmHg. Aortic valve peak gradient measures 28.5 mmHg. Aortic valve area, by VTI measures 0.74 cm?. Pulmonic Valve: The pulmonic valve was normal in structure. Pulmonic valve regurgitation is not visualized. No evidence of pulmonic stenosis. Aorta: The aortic root is normal in size and structure. Venous: The inferior vena cava is normal in size with greater than 50% respiratory variability, suggesting right atrial pressure of 3 mmHg. IAS/Shunts: No atrial level shunt detected by color flow Doppler.  LEFT VENTRICLE PLAX 2D LVIDd:         4.55 cm   Diastology LVIDs:         3.01 cm   LV e' medial:    4.46 cm/s LV PW:         1.04 cm   LV E/e' medial:  37.4 LV IVS:        1.09 cm   LV e' lateral:   7.51 cm/s LVOT diam:     1.70 cm   LV E/e' lateral: 22.2 LV SV:         43  LV SV Index:   24 LVOT Area:     2.27 cm?  RIGHT VENTRICLE RV Basal diam:  3.01 cm RV S prime:     12.20 cm/s LEFT ATRIUM             Index        RIGHT ATRIUM           Index LA diam:        3.60 cm 2.01 cm/m?   RA Area:     11.60 cm? LA Vol (A2C):   69.0 ml 38.62 ml/m?  RA Volume:   24.60 ml  13.77 ml/m? LA Vol (A4C):   57.3 ml 32.07 ml/m? LA Biplane Vol: 65.6 ml 36.72 ml/m?  AORTIC VALVE AV Area (Vmax):    0.81 cm? AV Area (Vmean):   0.74 cm? AV Area (VTI):     0.74 cm? AV Vmax:           267.00 cm/s AV Vmean:          200.000 cm/s AV VTI:            0.577 m AV Peak Grad:      28.5 mmHg AV Mean Grad:      18.0 mmHg LVOT Vmax:         95.00 cm/s LVOT Vmean:        65.200 cm/s LVOT VTI:          0.188 m LVOT/AV VTI ratio: 0.33 AI PHT:            292 msec MITRAL VALVE MV Area (PHT): 2.74 cm?     SHUNTS MV Area VTI:   0.75 cm?     Systemic VTI:  0.19 m MV Peak  grad:  17.3 mmHg    Systemic Diam: 1.70 cm MV Mean grad:  7.0 mmHg MV Vmax:       2.08 m/s MV Vmean:      117.0 cm/s MV Decel Time: 277 msec MV E velocity: 167.00 cm/s MV A velocity: 108.00 cm/s MV E/A rat

## 2021-05-26 NOTE — Consult Note (Signed)
?[place] CARDIOLOGY CONSULT NOTE  ? ?    ?Patient ID: ?Kerri Carter ?MRN: [id number] ?DOB/AGE: 79-Oct-1942 79 y.o. ? ?Admit date: 05/23/2021 ?Referring Physician Dr. Eugenie Norrie ?Primary Physician Dr. Olivia Mackie McLean-Scocuzza ?Primary Cardiologist Laqueta Jean, PA-C ?Reason for Consultation acute CHF ? ?HPI: Kerri Carter is a 81 year old female with a PMH of HFpEF (LVEF 50%, g1DD 05/16/21), low flow/low gradient AS, COPD on 2L of O2 chronically, nonobstructive CAD by heart cath 03/2011, hypertension, hyperlipidemia, PAD, CKD3 with history of R nephrectomy 1988, depression, who presented to [facility] for a lumbar fusion extension on 05/23/21 with Dr. Izora Carter. On POD3 (4/24) she became hypoxic in the early morning with associated shortness of breath, with an increased oxygen requirement from HFNC to BiPAP. Cardiology is consulted for further assistance.  ? ?Per review of notes this morning just before 0500 the patient was hypoxic to the mid 80s on 6 L by nasal cannula which was increased to 15 L HFNC by respiratory with O2 sat at 91%. The patient is seen and examined while she is on BiPAP.  She says she feels horrible and points to pain in her left hip and right upper leg. She "has no idea" what happened this morning, just became short of breath early this morning. She has dependent lower extremity edema but is unsure if this is worse than usual. She denies chest pain, but continues to point to pain in her upper legs.  Denies pain in her lower legs or calves bilaterally.  The remainder of the history is limited.  ? ?She was given a one-time dose of 40 mg of Lasix with about 350 mL recorded output thus far, also started on empiric Unasyn and azithromycin for pneumonia.  Vitals are notable for a blood pressure of 120/62 heart rate 80. ? ?Labs are notable for a potassium of 5.6, BUN 34, creatinine 1.65, GFR 32.  On admission creatinine was 1.25 GFR 44.  BNP elevated at 995, troponin 52 with repeats pending.  WBCs on admission  15.8, uptrending to 25.2.  H&H stable at 9.7/32.  D-dimer elevated at 1.52 ? ?Chest x-ray this morning with bilateral pulmonary opacifications with hazy appearance suggesting pleural fluid and atelectasis.  Asymmetric rounded density on the left could be superimposed pneumonia. ? ?Review of systems complete and found to be negative unless listed above  ? ? ? ?Past Medical History:  ?Diagnosis Date  ? Anxiety   ? Aortic stenosis 07/09/2015  ? a.) TTE 07/06/2015: EF 55-60%; mild AS with MPG 13.0 mmHg.  ? Asthma   ? Bipolar disorder ([place])   ? C. difficile diarrhea 2010  ? a.) following ABX course during hospital admission  ? Carotid atherosclerosis, bilateral   ? a.) Moderate; < 50% stenosis BILATERAL ICAs.  ? Cataract   ? a.) s/p BILATERAL extraction  ? Chicken pox   ? CKD (chronic kidney disease), stage III ([place])   ? a. s/p R nephrectomy./ aneurysm  ? Conversion disorder   ? COPD (chronic obstructive pulmonary disease) ([place])   ? Depression   ? Essential hypertension   ? GERD (gastroesophageal reflux disease)   ? Heart murmur   ? Hyperlipidemia   ? ILD (interstitial lung disease) ([place])   ? mild; 2/2 RA diagnosis  ? Inflammatory arthritis   ? a. hands/carpal tunnel.  b. Low titer rheumatoid factor. c. Negative anti-CCP antibodies. d. Plaquenil.  ? Macular degeneration   ? Nocturnal hypoxemia   ? Non-Obstructive CAD   ? a. 07/2009 Cath (  [facility]): nonobs dzs;  b. 03/2011 Cath [facility] [place]): nonobs dzs.  ? Osteoarthritis   ? a. Knees.  ? PAD (peripheral artery disease) ([place])   ? PUD (peptic ulcer disease)   ? S/P right hip fracture   ? 11/01/16 s/p repair  ? Shoulder pain   ? Sleep apnea   ? no cpap / minimal  ? Spinal stenosis at L4-L5 level   ? severe with L4/L5 anterolisthesis grade 1 anterolisthesis   ? Toxic maculopathy   ? Valvular heart disease   ? a.) TTE 07/06/2015: EF 55-60%; Mild MR/AR/TR; Mild AS with MPG 13.0 mmHg.  ?  ?Past Surgical History:  ?Procedure Laterality Date  ? APPENDECTOMY    ? BACK SURGERY    ? lumbar fusion   ? BUNIONECTOMY Right   ? CATARACT EXTRACTION, BILATERAL    ? CESAREAN SECTION    ? x1  ? CHOLECYSTECTOMY N/A 05/11/2016  ? Procedure: LAPAROSCOPIC CHOLECYSTECTOMY;  Surgeon: Florene Glen, MD;  Location: [facility] ORS;  Service: General;  Laterality: N/A;  ? COLONOSCOPY WITH PROPOFOL N/A 04/02/2016  ? Procedure: COLONOSCOPY WITH PROPOFOL;  Surgeon: Jonathon Bellows, MD;  Location: [facility] ENDOSCOPY;  Service: Endoscopy;  Laterality: N/A;  ? ENDOSCOPIC RETROGRADE CHOLANGIOPANCREATOGRAPHY (ERCP) WITH PROPOFOL N/A 05/08/2016  ? Procedure: ENDOSCOPIC RETROGRADE CHOLANGIOPANCREATOGRAPHY (ERCP) WITH PROPOFOL;  Surgeon: Lucilla Lame, MD;  Location: [facility] ENDOSCOPY;  Service: Endoscopy;  Laterality: N/A;  ? ERCP    ? with biliary spincterotomy 05/08/16 Dr. Allen Norris for choledocholithiasis   ? ESOPHAGEAL DILATION  04/02/2016  ? Procedure: ESOPHAGEAL DILATION;  Surgeon: Jonathon Bellows, MD;  Location: [facility] ENDOSCOPY;  Service: Endoscopy;;  ? ESOPHAGOGASTRODUODENOSCOPY (EGD) WITH PROPOFOL N/A 04/02/2016  ? Procedure: ESOPHAGOGASTRODUODENOSCOPY (EGD) WITH PROPOFOL;  Surgeon: Jonathon Bellows, MD;  Location: [facility] ENDOSCOPY;  Service: Endoscopy;  Laterality: N/A;  ? HIP ARTHROPLASTY Right 11/01/2016  ? Procedure: ARTHROPLASTY BIPOLAR HIP (HEMIARTHROPLASTY);  Surgeon: Corky Mull, MD;  Location: [facility] ORS;  Service: Orthopedics;  Laterality: Right;  ? LUMBAR LAMINECTOMY/DECOMPRESSION MICRODISCECTOMY N/A 12/11/2020  ? Procedure: LEFT L2-3 MICRODISCECTOMY, L3-4 AND L5-S1 DECOMPRESSION;  Surgeon: Meade Maw, MD;  Location: [facility] ORS;  Service: Neurosurgery;  Laterality: N/A;  ? NEPHRECTOMY  1988  ? 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 ORIF SCAPHOID FRACTURE Left 12/21/2019  ? Procedure: OPEN REDUCTION INTERNAL FIXATION (ORIF) OF LEFT SCAPULAR NONUNION WITH BONE GRAFT;  Surgeon: Corky Mull, MD;  Location: [facility] ORS;  Service: Orthopedics;  Laterality: Left;  ? osteoporosis    ? noted DEXA 08/19/16   ? REPLACEMENT TOTAL KNEE Right   ?  REVERSE SHOULDER ARTHROPLASTY Right 11/04/2017  ? Procedure: REVERSE SHOULDER ARTHROPLASTY;  Surgeon: Corky Mull, MD;  Location: [facility] ORS;  Service: Orthopedics;  Laterality: Right;  ? REVERSE SHOULDER ARTHROPLASTY Left 07/26/2018  ? Procedure: REVERSE SHOULDER ARTHROPLASTY;  Surgeon: Corky Mull, MD;  Location: [facility] ORS;  Service: Orthopedics;  Laterality: Left;  ? TONSILLECTOMY    ? TOTAL HIP ARTHROPLASTY  12/10/11  ? [place] left hip  ? TOTAL HIP ARTHROPLASTY Bilateral   ? TUBAL LIGATION    ?  ?Medications Prior to Admission  ?Medication Sig Dispense Refill Last Dose  ? Adalimumab 40 MG/0.4ML PNKT Inject 40 mg into the skin every 14 (fourteen) days.   Past Month  ? albuterol (VENTOLIN HFA) 108 (90 Base) MCG/ACT inhaler Inhale 2 puffs into the lungs every 6 (six) hours as needed for wheezing or shortness of breath. 18 g 11 05/23/2021  ?  amLODipine (NORVASC) 2.5 MG tablet Take 1 tablet (2.5 mg total) by mouth daily. (Patient taking differently: Take 5 mg by mouth 2 (two) times daily.) 90 tablet 3 05/23/2021  ? amoxicillin-clavulanate (AUGMENTIN) 875-125 MG tablet Take 1 tablet by mouth 2 (two) times daily. With food x 7-10 days 20 tablet 0 05/22/2021  ? benzonatate (TESSALON) 200 MG capsule Take 1 capsule (200 mg total) by mouth 3 (three) times daily as needed for cough. 40 capsule 0 05/22/2021  ? budesonide (PULMICORT) 0.25 MG/2ML nebulizer solution USE 1 VIAL  IN  NEBULIZER TWICE  DAILY - rinse mouth after treatment 60 mL 11 05/23/2021  ? Cholecalciferol (VITAMIN D3) 50 MCG (2000 UT) TABS Take 4,000 Units by mouth daily.   05/22/2021  ? clopidogrel (PLAVIX) 75 MG tablet Take 75 mg by mouth daily.   05/16/2021  ? cyclobenzaprine (FLEXERIL) 10 MG tablet Take 1 tablet (10 mg total) by mouth 3 (three) times daily as needed for muscle spasms. 30 tablet 0 05/22/2021  ? dicyclomine (BENTYL) 10 MG capsule Take 1 capsule (10 mg total) by mouth 2 (two) times daily as needed for spasms. (Patient taking differently: Take 20 mg by  mouth 2 (two) times daily.) 180 capsule 3 05/23/2021  ? erythromycin ophthalmic ointment Place 1 application. into both eyes 3 (three) times daily. X4-7 days 3.5 g 0 05/22/2021  ? escitalopram (LEXAPRO) 10

## 2021-05-26 NOTE — Progress Notes (Signed)
OT Cancellation Note ? ?Patient Details ?Name: Kerri Carter ?MRN: [id number] ?DOB: 05/23/1940 ? ? ?Cancelled Treatment:    Reason Eval/Treat Not Completed: Medical issues which prohibited therapy. Per conversation with RN, pt had hypoxic event overnight, pending possible PCU t/f. Will re-eval as appropriate upon continue at transfer orders.  ? ?Dessie Coma, M.S. OTR/L  ?05/26/21, 12:56 PM  ?ascom [phone] ? ?

## 2021-05-26 NOTE — Assessment & Plan Note (Deleted)
-   We will continue her Lexapro. 

## 2021-05-26 NOTE — Assessment & Plan Note (Signed)
-   I suspect more diastolic etiology. ?- We will diurese the patient with IV Lasix. ?- We will follow electrolytes. ?- We will obtain a 2D echo and cardiology consult. ?- I notified Dr. Clayborn Bigness about the patient. ?

## 2021-05-26 NOTE — Consult Note (Signed)
? ?NAME:  Kerri Carter, MRN:  [id number], DOB:  1940-12-07, LOS: 3 ?ADMISSION DATE:  05/23/2021, CONSULTATION DATE:  05/26/2021 ?REFERRING MD:  Dr. Posey Pronto, CHIEF COMPLAINT:  Acute on Chronic Hypoxic Respiratory Failure  ? ?Brief Pt Description / Synopsis:  ?Kerri Carter is a 81 year old female with a PMH significant for HFpEF (LVEF 50%, g1DD 05/16/21), COPD on chronic 2L supplemental O2, CAD, hypertension, hyperlipidemia, PAD, CKD3 with history of R nephrectomy 1988 who presented to [facility] on 05/23/21 for elective lumbar fusion extension with Dr. [name].  On POD3 (4/24) she developed Acute on Chronic Hypoxic Respiratory Failure in the setting of Acute Decompensated HFpEF, questionable pneumonia, questionable PE, & AECOPD requiring BiPAP. ? ?History of Present Illness:  ?Kerri Carter is a 81 year old female with an extensive past medical history as listed below who presented to [facility] on 05/23/2021 for elective lumbar fusion extension with Dr. [name]. ? ?On 12/11/2020 she underwent left L2-L3 microdiscectomy and left L3-L4 and L5-S1 decompression.  She failed conservative management of her condition with worsening pain, therefore decision was made to proceed with extension of lumbar fusion to L2.  Postop she exhibited expected back pain, and was working with PT/OT.  Patient was noted to refuse Lovenox for DVT prophylaxis. ? ?Hospital Course: ?Early in the morning on 05/26/2021 (postop day 3) she developed acute hypoxia and acute dyspnea.  She reported associated dry cough, wheezing, orthopnea, and worsening lower extremity edema.  She denied fever, chills, chest pain, palpitations, dizziness, headache. ? ?SPO2 was initially 90% on 4 L of supplemental O2, decreased down to 75% of which she was subsequent placed on 6 L with minimal improvement.  She was then placed on high flow nasal cannula 15 L supplemental O2 with improvement in sats to 90 to 91% ?Vital Signs: Temperature 97.9 ?F orally, respiratory rate 20, pulse 91, blood  pressure 113/65, SPO2 84% on 6 L nasal cannula ?Significant Labs: Potassium 5.6, BUN 34, creatinine 1.65, BNP 995, high-sensitivity troponin 52, WBC 25.1 with neutrophilia, D-dimer 1.52, hemoglobin 9.7, hematocrit 32 ?ABG on high flow nasal cannula: pH 746/PCO2 43/PO2 60/bicarb 30.6 ?Imaging ?Chest X-ray>>IMPRESSION: ?Bilateral pulmonary opacification with hazy appearance suggesting ?pleural fluid and atelectasis. Asymmetric rounded density on the ?left could be superimposed pneumonia. ?Medications Administered: 40 mg IV Lasix, Azithromycin & Unasyn ? ?Hospitalist were consulted for work-up of acute on chronic hypoxic respiratory failure.  Cardiology was consulted due to acute CHF exacerbation.  Despite interventions, she required BiPAP due to her work of breathing.  PCCM is consulted for further assistance. ? ?Pertinent  Medical History  ? ?Past Medical History:  ?Diagnosis Date  ? Anxiety   ? Aortic stenosis 07/09/2015  ? a.) TTE 07/06/2015: EF 55-60%; mild AS with MPG 13.0 mmHg.  ? Asthma   ? Bipolar disorder ([place])   ? C. difficile diarrhea 2010  ? a.) following ABX course during hospital admission  ? Carotid atherosclerosis, bilateral   ? a.) Moderate; < 50% stenosis BILATERAL ICAs.  ? Cataract   ? a.) s/p BILATERAL extraction  ? Chicken pox   ? CKD (chronic kidney disease), stage III ([place])   ? a. s/p R nephrectomy./ aneurysm  ? Conversion disorder   ? COPD (chronic obstructive pulmonary disease) ([place])   ? Depression   ? Essential hypertension   ? GERD (gastroesophageal reflux disease)   ? Heart murmur   ? Hyperlipidemia   ? ILD (interstitial lung disease) ([place])   ? mild; 2/2 RA diagnosis  ? Inflammatory arthritis   ?  a. hands/carpal tunnel.  b. Low titer rheumatoid factor. c. Negative anti-CCP antibodies. d. Plaquenil.  ? Macular degeneration   ? Nocturnal hypoxemia   ? Non-Obstructive CAD   ? a. 07/2009 Cath ([facility]): nonobs dzs;  b. 03/2011 Cath [facility]): nonobs dzs.  ? Osteoarthritis   ? a. Knees.  ? PAD  (peripheral artery disease) ([place])   ? PUD (peptic ulcer disease)   ? S/P right hip fracture   ? 11/01/16 s/p repair  ? Shoulder pain   ? Sleep apnea   ? no cpap / minimal  ? Spinal stenosis at L4-L5 level   ? severe with L4/L5 anterolisthesis grade 1 anterolisthesis   ? Toxic maculopathy   ? Valvular heart disease   ? a.) TTE 07/06/2015: EF 55-60%; Mild MR/AR/TR; Mild AS with MPG 13.0 mmHg.  ? ? ?Micro Data:  ?4/24: Blood culture x2>> ? ?Antimicrobials:  ?Azithromycin 4/24>> ?Unasyn 4/24>> ? ?Significant Hospital Events: ?Including procedures, antibiotic start and stop dates in addition to other pertinent events   ?4/21: Underwent elective lumbar fusion with Dr. [name] ?4/22: Declining DVT prophylaxis (Lovenox). ?4/24: Acute Hypoxia and Respiratory Distress requiring BiPAP.  Hospitalist and PCCM consulted.  Aggressive diuresis per Cardiology, empirically covering for pneumonia.  Rule out DVT/PE. ? ?Interim History / Subjective:  ?-Patient on BiPAP, if reports her shortness of breath is much improved on BiPAP ?-Afebrile, hemodynamically stable, no vasopressors ?-Earlier this morning patient developed severe hypoxia with O2 sats in the 80s and acute respiratory distress requiring BiPAP ?-Concern for pulmonary edema and questionable pneumonia ~was given 40 mg IV Lasix x1 along with initiation of azithromycin and Unasyn ?-Cardiology has evaluated the patient, placed on 40 mg Lasix twice daily, echocardiogram is pending ?-Given that patient has declined DVT prophylaxis since surgery, will obtain venous Dopplers and Echocardiogram with consideration for VQ scan to rule out DVT and PE (unable to obtain CTA chest due to renal function) ?-Patient is currently very comfortable on minimal BiPAP settings, will try and wean to heated high flow nasal cannula and assess how she tolerates ? ?Objective   ?Blood pressure 120/62, pulse 80, temperature (!) 97.2 ?F (36.2 ?C), temperature source Axillary, resp. rate 14, height 5' 1.5"  (1.562 m), weight 78.5 kg, SpO2 90 %. ?   ?   ? ?Intake/Output Summary (Last 24 hours) at 05/26/2021 1106 ?Last data filed at 05/26/2021 0930 ?Gross per 24 hour  ?Intake 0 ml  ?Output 510 ml  ?Net -510 ml  ? ?Filed Weights  ? 05/23/21 1841  ?Weight: 78.5 kg  ? ? ?Examination: ?General: Acute on chronically ill-appearing female, sitting in bed, on BiPAP, no acute distress ?HENT: Atraumatic, normocephalic, neck supple, difficult to assess JVP due to BiPAP ?Lungs: Coarse breath sounds throughout, even, BiPAP assisted, accessory muscle use ?Cardiovascular: Regular rate and rhythm, S1-S2, ?Abdomen: Soft, nontender, nondistended, no guarding or rebound tenderness, bowel sounds positive x4 ?Extremities: Normal bulk and tone, no deformities, 1+ edema bilateral lower extremities ?Neuro: Awake and alert, oriented x4, follows commands, no focal deficits ?GU: Deferred ? ?Resolved Hospital Problem list   ? ? ?Assessment & Plan:  ? ?Acute on Chronic Hypoxic Respiratory Failure in the setting of Acute Decompensated HFpEF, questionable pneumonia, questionable PE, & AECOPD ?-Supplemental O2 as needed to maintain O2 sats 88 to 92% ?-BiPAP, wean as tolerated ?-Follow intermittent Chest X-ray & ABG as needed ?-Bronchodilators & Pulmicort nebs ?-Change 7.5 mg Prednisone daily to Solumedrol 20 mg BID ?-ABX as above ?-Diuresis as BP and renal  function permits ~ currently on Lasix 40 mg BID ?-Pulmonary toilet as able ?-D-dimer noted to be 1.52, pt has reportedly declined chemical DVT prophylaxis since her lumbar fusion 3 days ago ~ unable to obtain CTA Chest due to renal function, will obtain Venous US and Echocardiogram with consideration for Lung V/Q scan ? ?Acute Decompensated HFpEF (LVEF 50%-55%, grade II DD, severe MV regurgitation 05/26/21) ?Elevated Troponin, suspect demand ischemia ?PMHx: CAD ?-Continuous cardiac monitoring ?-Maintain MAP >65 ?-Trend HS Troponin until peaked ?-BNP 995 ?-Diuresis as BP and renal function permits ~  currently on Lasix 40 mg BID ?-Echocardiogram 4/24: LVEF 50-55%, Grade II Diastolic dysfunction, RV systolic function normal and RV size normal, severe mitral valve regurgitation ?-Cardiology following, appreciat

## 2021-05-26 NOTE — Progress Notes (Addendum)
Patient is status post lumbar fusion surgery three days ago. She developed acute hypoxia early hours of the morning today and was seen by Dr. Sidney Ace. ?Patient was placed on high flow nasal cannula oxygen and required BiPAP as well. Chest x-ray shows bilateral pulmonary infiltrate versus pulmonary edema. ?She has history of chronic interstitial lung disease/COPD uses oxygen at nighttime and follows up with Dr. Duwayne Heck with [facility] pulmonary ?white count elevated 25K could be due to his recent surgery and steroids. No fever. She was started on  broad-spectrum antibiotic overnite ?Pro calcitonin 0.85 ?patient also on IV steroids, Lasix. Case discussed with Dr.Kasa -- and okay to discontinue antibiotics and follow clinically. ? ?Echo ordered. Pt has postsurgical drain which will be removed in next dew days per Dr [name] ? ? ?Discussed with patient's son at bedside. Patient is a full code. ? ?

## 2021-05-26 NOTE — Progress Notes (Addendum)
SLP Cancellation Note ? ?Patient Details ?Name: Kerri Carter ?MRN: [id number] ?DOB: [birth year] ? ? ?Cancelled treatment:       Reason Eval/Treat Not Completed: Medical issues which prohibited therapy  ? ?@ [phone] SLP consult received and appreciated. Chart review completed. SLP evaluation deferred at this time as pt on Bi-Pap. Will continue efforts as appropriate. ? ?@ [phone] Pt continues to require high levels of supplemental O2 (40L/min via HFNC). Pt with pending possible PCU transfer. Pending Pulmonology consults and work up for possible PE. Pt OTF in nuclear medicine for testing at this time. Will defer clinical swallowing evaluation pending improvements in respiratory status. ? ?Cherrie Gauze, M.S., CCC-SLP ?Speech-Language Pathologist ?[facility] ?[phone] ([place]) ? ?Kerri Carter ?05/26/2021, 12:57 PM ?

## 2021-05-26 NOTE — Assessment & Plan Note (Signed)
-   We will continue his Lexapro and Xanax. ?

## 2021-05-26 NOTE — Progress Notes (Signed)
Called by pt RN for desats. Gradually increased pt FIO2 to maintain SPO2 88-92%. Pt on 80% at this time with an SPO2 of 92%, Will titrate as tolerated by the pt. ?

## 2021-05-26 NOTE — Consult Note (Signed)
[facility] CM Inpatient Consult ? ? ?05/26/2021 ? ?Synetta Shadow ?[birth year] ?[id number] ? ?[facility] CM) ?  ?Patient chart has been reviewed with noted high risk score for unplanned readmissions.  Patient assessed for community [place] [facility] follow up needs. Per review, current recommendation is for SNF. No [facility] CM needs. ?  ?Of note, [facility] Care Management services does not replace or interfere with any services that are arranged by inpatient case management or social work.  ?  ?Netta Cedars, MSN, RN ?[facility] Liaison ?Phone [phone] ?Toll free office [phone]   ? ?

## 2021-05-26 NOTE — Care Management Important Message (Signed)
Important Message ? ?Patient Details  ?Name: Kerri Carter ?MRN: [id number] ?Date of Birth: 08-07-1940 ? ? ?Medicare Important Message Given:  N/A - LOS <3 / Initial given by admissions ? ? ? ? ?[name] ?05/26/2021, 9:25 AM ?

## 2021-05-26 NOTE — Progress Notes (Signed)
? ?   Attending Progress Note ? ?History: Kerri Carter is here for adjacent segment segment disease with spondylolisthesis in the lumbar spine as well as lumbar radiculopathy. ? ?POD3: suffered from hypoxia overnight was increased O2 requirement. Hosptialist consulted for assistance.  ? ?POD2: Doing well with continued back pain.   ? ?POD1: Expected post-op back pain.  Her legs feel better. ? ?Physical Exam: ?Vitals:  ? 05/26/21 0445 05/26/21 0448  ?BP:  121/76  ?Pulse:  89  ?Resp:    ?Temp:    ?SpO2: 93% (!) 89%  ? ? ?AA Ox3 ?CNI ? ?Strength:5/5 throughout BLE ? ?Drains ?Deep 70 ?Superficial 40 ? ? ?Data: ? ?Recent Labs  ?Lab 05/26/21 ?8341  ?NA 135  ?K 5.6*  ?CL 102  ?CO2 27  ?BUN 34*  ?CREATININE 1.65*  ?GLUCOSE 142*  ?CALCIUM 8.1*  ? ? ?No results for input(s): AST, ALT, ALKPHOS in the last 168 hours. ? ?Invalid input(s): TBILI  ? Recent Labs  ?Lab 05/23/21 ?1447 05/26/21 ?0442  ?WBC 15.8* 25.1*  ?HGB 9.9* 9.7*  ?HCT 32.3* 32.0*  ?PLT 259 250  ? ? ?No results for input(s): APTT, INR in the last 168 hours.  ?   ? ? ?Other tests/results: n/a ? ?Assessment/Plan: ? ?Kerri Carter is doing well after extension of lumbar fusion to L2. ? ?- mobilize ?- pain control ?- DVT prophylaxis - she has declined lovenox and understands the consequences ?- PTOT ?- Remove foley 4/23 ?- Continue drains - likely remove deep drain today ?- appreciate hospitalists assistance with medical management.  ? ?Cooper Render  ?Department of Neurosurgery ? ? ? ?

## 2021-05-26 NOTE — Plan of Care (Signed)
? ?  Interdisciplinary Goals of Care Family Meeting ? ? ?Date carried out: 05/26/2021 ? ?Location of the meeting: Bedside ? ?Member's involved: Physician, Nurse Practitioner, Bedside Registered Nurse, and Family Member or next of kin ? ? ?Code status: Full Code ? ?Disposition: Continue current acute care ? ? ? ? ? ?GOALS OF CARE DISCUSSION ? ?The Clinical status was relayed to family in detail- ? ?Updated and notified of patients medical condition- ?Patient is having a weak cough and struggling to remove secretions.   ?Patient with increased WOB and using accessory muscles to breathe ?Explained to family course of therapy and the modalities  ? ?Patient with Progressive multiorgan failure with a very high probablity of a very minimal chance of meaningful recovery despite all aggressive and optimal medical therapy.  ?PATIENT REMAINS FULL CODE ?Transfer to SD/ICU ?High risk for intubation ? ?Family understands the situation. ? ?Family are satisfied with Plan of action and management. All questions answered ? ?Additional CC time 25 mins ? ? ?Corrin Parker, M.D.  ?[facility]  ?Medical Director [place] ?Medical Director [facility] Cardio-Pulmonary Department  ? ? ?

## 2021-05-26 NOTE — Assessment & Plan Note (Signed)
-   I suspect aspiration pneumonia. ?- The patient will be placed on IV Unasyn and Zithromax. ?-Mucolytic therapy will be provided ?- We will follow blood cultures. ?- Swallowing evaluation will be done. ?

## 2021-05-26 NOTE — H&P (Addendum)
?  ?  ?[facility] ? ? ?PATIENT NAME: Kerri Carter   ? ?MR#:  [id number] ? ?DATE OF BIRTH:  08/19/1940 ? ?DATE OF ADMISSION:  05/23/2021 ? ?PRIMARY CARE PHYSICIAN: McLean-Scocuzza, Nino Glow, MD  ? ?Patient is coming from: Home ? ?REQUESTING/REFERRING PHYSICIAN: Meade Maw, MD ? ?CHIEF COMPLAINT:  ?Low pulse oximetry and shortness of breath ? ? ?HISTORY OF PRESENT ILLNESS:  ?Kerri Carter is a 81 y.o. Caucasian female with medical history significant for COPD, asthma, bipolar disorder, depression, hypertension, stage III chronic kidney disease,  dyslipidemia, GERD, PAD and PUD, who is POD 3 S/P extension of lumbar fusion who developed acute hypoxia with associated dyspnea this morning.   The patient admits to dyspnea as well as dry cough and wheezing with associated orthopnea and worsening lower extremity edema.  She has been having nausea without vomiting or diarrhea.  No fever or chills.  No chest pain or palpitations.  No dysuria, oliguria or hematuria or flank pain.  She denies any headache or dizziness or blurred vision. ? ?ED Course: She initially had a Puls oximetry of 90% on 4 L of O2 nasal cannula and dropped down to 75% when she was raised up to 6 L and was still hypoxic 75-85% at which time she was placed on high flow nasal cannula with 15 L of O2.  Pulse currently was 90 to 91%.  BNP came back elevated at 995.2      and BUN was 34 with creatinine 1.65  potassium 5.6 previous creatinine was 1.22 on 4/22 CBC showed leukocytosis of 25.1 with neutrophilia and anemia D-dimer was 1.52and ?EKG as reviewed by me : Pending ?Imaging: Bilateral pulmonary opacification with hazy appearance suggesting ?pleural fluid and atelectasis. Asymmetric rounded density on the ?left could be superimposed pneumonia. ? ?I was consulted for medical evaluation and management. ?PAST MEDICAL HISTORY:  ? ?Past Medical History:  ?Diagnosis Date  ?? Anxiety   ?? Aortic stenosis 07/09/2015  ? a.) TTE 07/06/2015: EF 55-60%; mild AS  with MPG 13.0 mmHg.  ?? Asthma   ?? Bipolar disorder ([place])   ?? C. difficile diarrhea 2010  ? a.) following ABX course during hospital admission  ?? Carotid atherosclerosis, bilateral   ? a.) Moderate; < 50% stenosis BILATERAL ICAs.  ?? Cataract   ? a.) s/p BILATERAL extraction  ?? Chicken pox   ?? CKD (chronic kidney disease), stage III ([place])   ? a. s/p R nephrectomy./ aneurysm  ?? Conversion disorder   ?? COPD (chronic obstructive pulmonary disease) ([place])   ?? Depression   ?? Essential hypertension   ?? GERD (gastroesophageal reflux disease)   ?? Heart murmur   ?? Hyperlipidemia   ?? ILD (interstitial lung disease) ([place])   ? mild; 2/2 RA diagnosis  ?? Inflammatory arthritis   ? a. hands/carpal tunnel.  b. Low titer rheumatoid factor. c. Negative anti-CCP antibodies. d. Plaquenil.  ?? Macular degeneration   ?? Nocturnal hypoxemia   ?? Non-Obstructive CAD   ? a. 07/2009 Cath ([facility]): nonobs dzs;  b. 03/2011 Cath [facility]): nonobs dzs.  ?? Osteoarthritis   ? a. Knees.  ?? PAD (peripheral artery disease) ([place])   ?? PUD (peptic ulcer disease)   ?? S/P right hip fracture   ? 11/01/16 s/p repair  ?? Shoulder pain   ?? Sleep apnea   ? no cpap / minimal  ?? Spinal stenosis at L4-L5 level   ? severe with L4/L5 anterolisthesis grade 1 anterolisthesis   ?? Toxic  maculopathy   ?? Valvular heart disease   ? a.) TTE 07/06/2015: EF 55-60%; Mild MR/AR/TR; Mild AS with MPG 13.0 mmHg.  ? ? ?PAST SURGICAL HISTORY:  ? ?Past Surgical History:  ?Procedure Laterality Date  ?? APPENDECTOMY    ?? BACK SURGERY    ? lumbar fusion  ?? BUNIONECTOMY Right   ?? CATARACT EXTRACTION, BILATERAL    ?? CESAREAN SECTION    ? x1  ?? CHOLECYSTECTOMY N/A 05/11/2016  ? Procedure: LAPAROSCOPIC CHOLECYSTECTOMY;  Surgeon: Florene Glen, MD;  Location: [facility] ORS;  Service: General;  Laterality: N/A;  ?? COLONOSCOPY WITH PROPOFOL N/A 04/02/2016  ? Procedure: COLONOSCOPY WITH PROPOFOL;  Surgeon: Jonathon Bellows, MD;  Location: [facility] ENDOSCOPY;  Service: Endoscopy;   Laterality: N/A;  ?? ENDOSCOPIC RETROGRADE CHOLANGIOPANCREATOGRAPHY (ERCP) WITH PROPOFOL N/A 05/08/2016  ? Procedure: ENDOSCOPIC RETROGRADE CHOLANGIOPANCREATOGRAPHY (ERCP) WITH PROPOFOL;  Surgeon: Lucilla Lame, MD;  Location: [facility] ENDOSCOPY;  Service: Endoscopy;  Laterality: N/A;  ?? ERCP    ? with biliary spincterotomy 05/08/16 Dr. Allen Norris for choledocholithiasis   ?? ESOPHAGEAL DILATION  04/02/2016  ? Procedure: ESOPHAGEAL DILATION;  Surgeon: Jonathon Bellows, MD;  Location: [facility] ENDOSCOPY;  Service: Endoscopy;;  ?? ESOPHAGOGASTRODUODENOSCOPY (EGD) WITH PROPOFOL N/A 04/02/2016  ? Procedure: ESOPHAGOGASTRODUODENOSCOPY (EGD) WITH PROPOFOL;  Surgeon: Jonathon Bellows, MD;  Location: [facility] ENDOSCOPY;  Service: Endoscopy;  Laterality: N/A;  ?? HIP ARTHROPLASTY Right 11/01/2016  ? Procedure: ARTHROPLASTY BIPOLAR HIP (HEMIARTHROPLASTY);  Surgeon: Corky Mull, MD;  Location: [facility] ORS;  Service: Orthopedics;  Laterality: Right;  ?? LUMBAR LAMINECTOMY/DECOMPRESSION MICRODISCECTOMY N/A 12/11/2020  ? Procedure: LEFT L2-3 MICRODISCECTOMY, L3-4 AND L5-S1 DECOMPRESSION;  Surgeon: Meade Maw, MD;  Location: [facility] ORS;  Service: Neurosurgery;  Laterality: N/A;  ?? NEPHRECTOMY  1988  ? 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 ORIF SCAPHOID FRACTURE Left 12/21/2019  ? Procedure: OPEN REDUCTION INTERNAL FIXATION (ORIF) OF LEFT SCAPULAR NONUNION WITH BONE GRAFT;  Surgeon: Corky Mull, MD;  Location: [facility] ORS;  Service: Orthopedics;  Laterality: Left;  ?? osteoporosis    ? noted DEXA 08/19/16   ?? REPLACEMENT TOTAL KNEE Right   ?? REVERSE SHOULDER ARTHROPLASTY Right 11/04/2017  ? Procedure: REVERSE SHOULDER ARTHROPLASTY;  Surgeon: Corky Mull, MD;  Location: [facility] ORS;  Service: Orthopedics;  Laterality: Right;  ?? REVERSE SHOULDER ARTHROPLASTY Left 07/26/2018  ? Procedure: REVERSE SHOULDER ARTHROPLASTY;  Surgeon: Corky Mull, MD;  Location: [facility] ORS;  Service: Orthopedics;  Laterality: Left;  ?? TONSILLECTOMY    ?? TOTAL  HIP ARTHROPLASTY  12/10/11  ? [facility] left hip  ?? TOTAL HIP ARTHROPLASTY Bilateral   ?? TUBAL LIGATION    ? ? ?SOCIAL HISTORY:  ? ?Social History  ? ?Tobacco Use  ?? Smoking status: Former  ?  Packs/day: 0.50  ?  Years: 20.00  ?  Pack years: 10.00  ?  Types: Cigarettes  ?  Quit date: 02/02/1974  ?  Years since quitting: 47.3  ?? Smokeless tobacco: Never  ?Substance Use Topics  ?? Alcohol use: No  ? ? ?FAMILY HISTORY:  ? ?Family History  ?Problem Relation Age of Onset  ?? Rheum arthritis Mother   ?? Asthma Mother   ?? Parkinson's disease Mother   ?? Heart disease Mother   ?? Stroke Mother   ?? Hypertension Mother   ?? Heart attack Father   ?? Heart disease Father   ?? Hypertension Father   ?? Peripheral Artery Disease Father   ?? Diabetes Son   ??  Gout Son   ?? Asthma Sister   ?? Heart disease Sister   ?? Lung cancer Sister   ?? Heart disease Sister   ?? Heart disease Sister   ?? Breast cancer Sister   ?? Heart attack Sister   ?? Heart disease Brother   ?? Heart disease Maternal Grandmother   ?? Diabetes Maternal Grandmother   ?? Colon cancer Maternal Grandmother   ?? Cancer Maternal Grandmother   ?     Hodgkins lymphoma  ?? Heart disease Brother   ?? Alcohol abuse Brother   ?? Depression Brother   ?? Dementia Son   ? ? ?DRUG ALLERGIES:  ? ?Allergies  ?Allergen Reactions  ?? Ceftin [Cefuroxime Axetil] Anaphylaxis  ?? Lisinopril Anaphylaxis  ?? Sulfa Antibiotics Other (See Comments)  ?  Face swelling  ?? Sulfasalazine Anaphylaxis  ?? Morphine Other (See Comments)  ?  Per patient, low blood pressure issues that requires action to raise it back up. ?Can take small infrequent doses  ?? Xarelto [Rivaroxaban] Other (See Comments)  ?  Stomach burning, bleeding, and tar in stool  ?? Adhesive [Tape] Rash  ?  Paper tape and tega derm OK  ?? Antihistamines, Chlorpheniramine-Type Other (See Comments)  ?  Makes pt hyper  ?? Antivert [Meclizine Hcl] Other (See Comments)  ?  Bladder will not empty  ?? Aspirin Other (See Comments)  ?   Sulfasalazine allergy cross reacts  ?? Contrast Media [Iodinated Contrast Media] Rash  ?  she is able to use betadine scrubs.  ?? Decongestant [Pseudoephedrine Hcl] Other (See Comments)  ?  Makes pt hyper  ?? Doxycy

## 2021-05-26 NOTE — Assessment & Plan Note (Addendum)
-   Given elevated creatinine, will obtain VQ scan when the patient is stable to rule out PE. ?- We will also follow serial troponin I ?

## 2021-05-26 NOTE — Assessment & Plan Note (Signed)
-   This is superimposed on stage IIIb chronic kidney disease. ?- Is likely secondary to acute CHF due to renal hypoperfusion. ?- We will follow BMP with diuresis. ?

## 2021-05-27 ENCOUNTER — Telehealth: Payer: Self-pay | Admitting: Internal Medicine

## 2021-05-27 ENCOUNTER — Encounter: Payer: Self-pay | Admitting: Neurosurgery

## 2021-05-27 ENCOUNTER — Inpatient Hospital Stay: Payer: Medicare PPO

## 2021-05-27 DIAGNOSIS — J690 Pneumonitis due to inhalation of food and vomit: Secondary | ICD-10-CM | POA: Diagnosis not present

## 2021-05-27 DIAGNOSIS — J9601 Acute respiratory failure with hypoxia: Secondary | ICD-10-CM

## 2021-05-27 DIAGNOSIS — I480 Paroxysmal atrial fibrillation: Secondary | ICD-10-CM

## 2021-05-27 LAB — GLUCOSE, CAPILLARY
Glucose-Capillary: 150 mg/dL — ABNORMAL HIGH (ref 70–99)
Glucose-Capillary: 141 mg/dL — ABNORMAL HIGH (ref 70–99)
Glucose-Capillary: 233 mg/dL — ABNORMAL HIGH (ref 70–99)
Glucose-Capillary: 157 mg/dL — ABNORMAL HIGH (ref 70–99)
Glucose-Capillary: 345 mg/dL — ABNORMAL HIGH (ref 70–99)

## 2021-05-27 LAB — CBC
HCT: 27.7 % — ABNORMAL LOW (ref 36.0–46.0)
Hemoglobin: 8.8 g/dL — ABNORMAL LOW (ref 12.0–15.0)
MCH: 27.1 pg (ref 26.0–34.0)
MCHC: 31.8 g/dL (ref 30.0–36.0)
MCV: 85.2 fL (ref 80.0–100.0)
Platelets: 260 10*3/uL (ref 150–400)
RBC: 3.25 MIL/uL — ABNORMAL LOW (ref 3.87–5.11)
RDW: 14.9 % (ref 11.5–15.5)
WBC: 17.3 10*3/uL — ABNORMAL HIGH (ref 4.0–10.5)
nRBC: 0.6 % — ABNORMAL HIGH (ref 0.0–0.2)

## 2021-05-27 LAB — PHOSPHORUS: Phosphorus: 3.5 mg/dL (ref 2.5–4.6)

## 2021-05-27 LAB — BASIC METABOLIC PANEL
Anion gap: 8 (ref 5–15)
BUN: 43 mg/dL — ABNORMAL HIGH (ref 8–23)
CO2: 30 mmol/L (ref 22–32)
Calcium: 7.9 mg/dL — ABNORMAL LOW (ref 8.9–10.3)
Chloride: 97 mmol/L — ABNORMAL LOW (ref 98–111)
Creatinine, Ser: 1.56 mg/dL — ABNORMAL HIGH (ref 0.44–1.00)
GFR, Estimated: 34 mL/min — ABNORMAL LOW (ref >60)
Glucose, Bld: 197 mg/dL — ABNORMAL HIGH (ref 70–99)
Potassium: 4.3 mmol/L (ref 3.5–5.1)
Sodium: 135 mmol/L (ref 135–145)

## 2021-05-27 LAB — HIV ANTIBODY (ROUTINE TESTING W REFLEX): HIV Screen 4th Generation wRfx: NONREACTIVE

## 2021-05-27 LAB — PROCALCITONIN: Procalcitonin: 0.75 ng/mL

## 2021-05-27 LAB — MAGNESIUM: Magnesium: 2.4 mg/dL (ref 1.7–2.4)

## 2021-05-27 MED ORDER — AMLODIPINE BESYLATE 5 MG PO TABS
5.0000 mg | ORAL_TABLET | Freq: Every day | ORAL | Status: DC
  Administered 2021-05-27 – 2021-06-02 (×7): 5 mg via ORAL
  Filled 2021-05-27 (×7): qty 1

## 2021-05-27 NOTE — TOC Progression Note (Signed)
Transition of Care (TOC) - Progression Note  ? ? ?Patient Details  ?Name: Kerri Carter ?MRN: [id number] ?Date of Birth: 1940-03-09 ? ?Transition of Care (TOC) CM/SW Contact  ?Shelbie Hutching, RN ?Phone Number: ?05/27/2021, 1:58 PM ? ?Clinical Narrative:    ?Patient stepdown status currently on HFNC 15 L.  TOC will cont to follow.   ? ? ?Expected Discharge Plan: [place] ?Barriers to Discharge: Continued Medical Work up ? ?Expected Discharge Plan and Services ?Expected Discharge Plan: [place] ?  ?  ?  ?Living arrangements for the past 2 months: [place] ?                ?  ?  ?  ?  ?  ?  ?  ?  ?  ?  ? ? ?Social Determinants of Health (SDOH) Interventions ?  ? ?Readmission Risk Interventions ? ?  05/24/2021  ?  2:35 PM  ?Readmission Risk Prevention Plan  ?Transportation Screening Complete  ?PCP or Specialist Appt within 3-5 Days Complete  ?[place] or Home Care Consult Complete  ?Social Work Consult for [place] Planning/Counseling Complete  ?Palliative Care Screening Not Applicable  ?Medication Review Press photographer) Complete  ? ? ?

## 2021-05-27 NOTE — Progress Notes (Signed)
?PROGRESS NOTE ? ? ?HPI was per Dr. Sidney Ace: ?Kerri Carter is a 81 y.o. Caucasian female with medical history significant for COPD, asthma, bipolar disorder, depression, hypertension, stage III chronic kidney disease,  dyslipidemia, GERD, PAD and PUD, who is POD 3 S/P extension of lumbar fusion who developed acute hypoxia with associated dyspnea this morning.   The patient admits to dyspnea as well as dry cough and wheezing with associated orthopnea and worsening lower extremity edema.  She has been having nausea without vomiting or diarrhea.  No fever or chills.  No chest pain or palpitations.  No dysuria, oliguria or hematuria or flank pain.  She denies any headache or dizziness or blurred vision. ? ?ED Course: She initially had a Puls oximetry of 90% on 4 L of O2 nasal cannula and dropped down to 75% when she was raised up to 6 L and was still hypoxic 75-85% at which time she was placed on high flow nasal cannula with 15 L of O2.  Pulse currently was 90 to 91%.  BNP came back elevated at 995.2      and BUN was 34 with creatinine 1.65  potassium 5.6 previous creatinine was 1.22 on 4/22 CBC showed leukocytosis of 25.1 with neutrophilia and anemia D-dimer was 1.52and ?EKG as reviewed by me : Pending ?Imaging: Bilateral pulmonary opacification with hazy appearance suggesting ?pleural fluid and atelectasis. Asymmetric rounded density on the ?left could be superimposed pneumonia. ? ?As per NP Meda Coffee (ICU): ?4/21: Underwent elective lumbar fusion with Dr. [name] ?4/22: Declining DVT prophylaxis (Lovenox). ?4/24: Acute Hypoxia and Respiratory Distress requiring BiPAP.  Hospitalist and PCCM consulted.  Aggressive diuresis per Cardiology, empirically covering for pneumonia.  Rule out DVT/PE. ?4/24: NM Pulmonary Perfusion pulmonary embolism absent ?4/24: Echo revealed EF 50 to 55%, left grade II diastolic dysfunction (pseudonormalization) ?4/25: Pt stable on 15L HFNC, PCCM will sign off  ? ? ?Kerri Carter  YQM:[id number]  DOB: 1940-07-08 DOA: 05/23/2021 ?PCP: McLean-Scocuzza, Nino Glow, MD  ? ?Assessment & Plan: ?  ?Principal Problem: ?  Acute respiratory failure with hypoxia ([place]) ?Active Problems: ?  Left lower lobe pneumonia ?  Acute on chronic combined systolic and diastolic CHF (congestive heart failure) ([place]) ?  S/P lumbar fusion ?  Acute kidney injury superimposed on chronic kidney disease ([place]) ?  Chronic obstructive pulmonary disease (COPD) ([place]) ?  Peripheral neuropathy ?  Elevated d-dimer ?  Hyperkalemia ?  Anxiety and depression ? ?Assessment and Plan: ?Acute hypoxic respiratory failure: likely secondary to CHF exacerbation & pneumonia. Continue on IV lasix & IV abxs. Continue on supplemental oxygen and wean as tolerated, currently on 15L HFNC ?  ?Left lower lobe pneumonia: possible aspiration pneumonia. Continue on IV unasyn, azithromycin, steroids, bronchodilators & encourage incentive spirometry  ?  ?Acute on chronic diastolic CHF: as per cardio. W/  mod-severe AS, severe MR. Continue on IV lasix. Monitor I/Os. Cardio following and recs apprec  ? ?PAF: new onset. Continue on home dose of nebivolol. [place] 4, so needs anticoagulation but cardio will hold 14 days post-op before starting as per neuro surg recs  ? ?Chronic back pain: s/p lumbar fusion. Pain management as per neuro surg  ? ?Leukocytosis: secondary to above infection vs recent surg. Continue on IV abxs  ? ?Generalized weakness: PT/OT consulted  ?  ?AKI on CKDIIIb: Cr is labile. Avoid nephrotoxic meds  ?  ?Depression: severity unknown. Continue on home dose of lexapro ? ?Anxiety: severity unknown. Continue on home dose of xanax ?  ?  Elevated d-dimer: VQ scan neg for PE ? ?Obesity: BMI 33.8. Complicates overall care & prognosis ? ? ? ? ? ?DVT prophylaxis: lovenox ?Code Status:  full  ?Family Communication:  ?Disposition Plan: depends on PT/OT recs  ? ?Level of care: Stepdown ? ?Status is: Inpatient ?Remains inpatient appropriate because: severity of illness, see  above  ? ? ?Consultants:  ?Cardio ?Neuro surg ?ICU  ? ?Procedures: ? ?Antimicrobials: unasyn, azithromycin  ? ? ?Subjective: ?Pt c/o malaise  ? ?Objective: ?Vitals:  ? 05/27/21 0446 05/27/21 0500 05/27/21 0600 05/27/21 0700  ?BP:  (!) 109/52 126/63 (!) 84/67  ?Pulse:  73 75 93  ?Resp:  15 (!) 24 (!) 22  ?Temp:      ?TempSrc:      ?SpO2:  100% 92% 96%  ?Weight: 81.2 kg     ?Height:      ? ? ?Intake/Output Summary (Last 24 hours) at 05/27/2021 1403 ?Last data filed at 05/27/2021 1130 ?Gross per 24 hour  ?Intake 250 ml  ?Output 1500 ml  ?Net -1250 ml  ? ?Filed Weights  ? 05/23/21 1841 05/26/21 1700 05/27/21 0446  ?Weight: 78.5 kg 81.2 kg 81.2 kg  ? ? ?Examination: ? ?General exam: Appears calm and comfortable  ?Respiratory system: diminished breath sounds b/l  ?Cardiovascular system: irregularly irregular. No rubs, gallops or clicks.  ?Gastrointestinal system: Abdomen is nondistended, soft and nontender. Hypoactive bowel sounds heard. ?Central nervous system: Alert and oriented. Moves all extremities  ?Psychiatry: Judgement and insight appear normal. Flat mood and affect ? ? ?Data Reviewed: I have personally reviewed following labs and imaging studies ? ?CBC: ?Recent Labs  ?Lab 05/23/21 ?1447 05/26/21 ?0442 05/27/21 ?1017  ?WBC 15.8* 25.1* 17.3*  ?NEUTROABS  --  18.9*  --   ?HGB 9.9* 9.7* 8.8*  ?HCT 32.3* 32.0* 27.7*  ?MCV 87.3 88.4 85.2  ?PLT 259 250 260  ? ?Basic Metabolic Panel: ?Recent Labs  ?Lab 05/23/21 ?1447 05/24/21 ?0356 05/26/21 ?0442 05/27/21 ?1017  ?NA  --   --  135 135  ?K  --   --  5.6* 4.3  ?CL  --   --  102 97*  ?CO2  --   --  27 30  ?GLUCOSE  --   --  142* 197*  ?BUN  --   --  34* 43*  ?CREATININE 1.25* 1.22* 1.65* 1.56*  ?CALCIUM  --   --  8.1* 7.9*  ?MG  --   --   --  2.4  ?PHOS  --   --   --  3.5  ? ?GFR: ?Estimated Creatinine Clearance: 28.7 mL/min (A) (by C-G formula based on SCr of 1.56 mg/dL (H)). ?Liver Function Tests: ?No results for input(s): AST, ALT, ALKPHOS, BILITOT, PROT, ALBUMIN in the  last 168 hours. ?No results for input(s): LIPASE, AMYLASE in the last 168 hours. ?No results for input(s): AMMONIA in the last 168 hours. ?Coagulation Profile: ?No results for input(s): INR, PROTIME in the last 168 hours. ?Cardiac Enzymes: ?No results for input(s): CKTOTAL, CKMB, CKMBINDEX, TROPONINI in the last 168 hours. ?BNP (last 3 results) ?No results for input(s): PROBNP in the last 8760 hours. ?HbA1C: ?No results for input(s): HGBA1C in the last 72 hours. ?CBG: ?Recent Labs  ?Lab 05/26/21 ?2121 05/26/21 ?2311 05/27/21 ?0448 05/27/21 ?2549 05/27/21 ?1154  ?GLUCAP 131* 147* 150* 141* 233*  ? ?Lipid Profile: ?No results for input(s): CHOL, HDL, LDLCALC, TRIG, CHOLHDL, LDLDIRECT in the last 72 hours. ?Thyroid Function Tests: ?No results for input(s): TSH, T4TOTAL,  FREET4, T3FREE, THYROIDAB in the last 72 hours. ?Anemia Panel: ?No results for input(s): VITAMINB12, FOLATE, FERRITIN, TIBC, IRON, RETICCTPCT in the last 72 hours. ?Sepsis Labs: ?Recent Labs  ?Lab 05/26/21 ?1138 05/27/21 ?1017  ?PROCALCITON 0.85 0.75  ? ? ?Recent Results (from the past 240 hour(s))  ?Urine Culture     Status: Abnormal  ? Collection Time: 05/21/21  9:36 AM  ? Specimen: Urine, Clean Catch  ?Result Value Ref Range Status  ? Specimen Description   Final  ?  URINE, CLEAN CATCH ?Performed at [facility], [address], [place], [place] [address] ?  ? Special Requests   Final  ?  NONE ?Performed at [facility], [address] [place], [place] [address] ?  ? Culture MULTIPLE SPECIES PRESENT, SUGGEST RECOLLECTION (A)  Final  ? Report Status 05/23/2021 FINAL  Final  ?Culture, blood (Routine X 2) w Reflex to ID Panel     Status: None (Preliminary result)  ? Collection Time: 05/26/21  6:00 AM  ? Specimen: BLOOD  ?Result Value Ref Range Status  ? Specimen Description BLOOD LEFT ANTECUBITAL  Final  ? Special Requests   Final  ?  BOTTLES DRAWN AEROBIC AND ANAEROBIC Blood Culture results may not be optimal due to an excessive  volume of blood received in culture bottles  ? Culture   Final  ?  NO GROWTH 1 DAY ?Performed at [facility], [address] [place],  [address] ?  ? Report Status PENDING  Incomplete

## 2021-05-27 NOTE — Progress Notes (Signed)
Pharmacy Antibiotic Note ? ?Kerri Carter is a 81 y.o. female admitted on 05/23/2021 with aspiration pneumonia.  Pharmacy has been consulted Unasyn dosing.   ? ?Pt w/ med hx of ananaphylaxis to Ceftin, but has recently tolerated Augmentin in Oct 2022. On Day 2 of antibiotics. Scr 1.56 (baseline 1.3), CrCl within 10-30 ml/hr. ? ?Plan: ?Continue ampicillin/sulbactam 3 grams every 12 hours x 5 days ? ?Monitor renal function, clinical course. ? ?Height: '5\' 1"'$  (154.9 cm) ?Weight: 81.2 kg (179 lb 0.2 oz) ?IBW/kg (Calculated) : 47.8 ? ?Temp (24hrs), Avg:98 ?F (36.7 ?C), Min:98 ?F (36.7 ?C), Max:98 ?F (36.7 ?C) ? ?Recent Labs  ?Lab 05/23/21 ?1447 05/24/21 ?0356 05/26/21 ?0442 05/27/21 ?1017  ?WBC 15.8*  --  25.1* 17.3*  ?CREATININE 1.25* 1.22* 1.65* 1.56*  ? ?  ?Estimated Creatinine Clearance: 28.7 mL/min (A) (by C-G formula based on SCr of 1.56 mg/dL (H)).   ? ?Allergies  ?Allergen Reactions  ? Ceftin [Cefuroxime Axetil] Anaphylaxis  ? Lisinopril Anaphylaxis  ? Sulfa Antibiotics Other (See Comments)  ?  Face swelling  ? Sulfasalazine Anaphylaxis  ? Morphine Other (See Comments)  ?  Per patient, low blood pressure issues that requires action to raise it back up. ?Can take small infrequent doses  ? Xarelto [Rivaroxaban] Other (See Comments)  ?  Stomach burning, bleeding, and tar in stool  ? Adhesive [Tape] Rash  ?  Paper tape and tega derm OK  ? Antihistamines, Chlorpheniramine-Type Other (See Comments)  ?  Makes pt hyper  ? Antivert [Meclizine Hcl] Other (See Comments)  ?  Bladder will not empty  ? Aspirin Other (See Comments)  ?  Sulfasalazine allergy cross reacts  ? Contrast Media [Iodinated Contrast Media] Rash  ?  she is able to use betadine scrubs.  ? Decongestant [Pseudoephedrine Hcl] Other (See Comments)  ?  Makes pt hyper  ? Doxycycline Other (See Comments)  ?  GI upset  ? Levaquin [Levofloxacin In D5w] Rash  ? Polymyxin B Other (See Comments)  ?  Facial rash  ? Tetanus Toxoids Rash and Other (See Comments)  ?  Fever  and hot to touch at injection site  ? ? ?Antimicrobials this admission: ?4/24 Unasyn >> x 5 days ?4/24 Azithromycin >> ? ?Microbiology results: ?4/24 BCx: NG x 1 day ?4/19 UCx: Multiple species, suggest recollection     ?4/24 Sputum: Pending  ?4/24 MRSA PCR: not detected ? ?Thank you for allowing pharmacy to be a part of this patient?s care. ? ?Wynelle Cleveland, PharmD ?Pharmacy Resident  ?05/27/2021 ?11:43 AM ? ? ? ?

## 2021-05-27 NOTE — Progress Notes (Signed)
Assisting fellow therapist. Patient stated she would not wear BIPAP unit anymore. She states she has had it on all night. RN at bedside. Placed patient on cool high flow , tolerating changes at this time.  ?

## 2021-05-27 NOTE — Telephone Encounter (Signed)
Called pt to schedule follow appt... Pt son answer call... Pt son stated that Pt is on the ICU Unit at the hospital at this time... Pt son stated that pt had back surgery and after back surgery pt had complications were fluid went back into pt lungs... Advised Pt son when pt gets better to give our office a callback to schedule follow up appt... Advised pt son that Dr. Olivia Mackie and [facility] send our prayers for him and his family... Pt son stated if Dr. Olivia Mackie would like to reach out to him, his number is [phone]...  ?

## 2021-05-27 NOTE — Evaluation (Signed)
Physical Therapy Re-Evaluation ?Patient Details ?Name: Kerri Carter ?MRN: [id number] ?DOB: 17-Jun-1940 ?Today's Date: 05/27/2021 ? ?History of Present Illness ? Pt is a 81 y/o F admitted to hospital who presents with spondylithesis and lumbar radiculopathy and has failed conservative management. Pt is now s/p L 3/4 & 2/3 fusion with posterolateral arthrodesis from L2 to L5.  Pt noted with acute hypoxia with dyspnea 4/24 and transferred to ICU (pt with acute respiratory failure with hypoxia, L LL PNA, acute on chronic combined systolic and diastolic CHF, AKI superimposed on CKD, and s/p lumbar fusion). PMH: anxiety, aortic stenosis, asthma, CKD, CVA, depression, GERD/PUD, retention, interstitial lung disease, PAD, bipolar disorder, conversion disorder, heart murmur, HLD.  ?Clinical Impression ? New PT consult received (pt transferred to ICU d/t respiratory issues); MD Jimmye Norman talked with Neurosurgery and both cleared pt for therapy participation; PT re-evaluation performed.  Prior to hospital admission/surgery, pt was living with her grandson (is home alone during the day and limits mobility d/t extensive h/o falling).  Currently pt is mod to max assist with bed mobility via logrolling (pt performed R sidelying to sitting 2x's d/t only able to briefly sit 1st attempt d/t c/o low back pain and pt only able to sit for about 1 minute 2nd attempt d/t fatigue); pt requiring assist for sitting balance; 2 assist to boost pt up in bed using bed sheet and reposition end of session.  Pt reporting minimal low back pain at rest beginning/end of session.  O2 sats 92% or greater on 15 L via HFNC during sessions activities.  Pt would benefit from skilled PT to address noted impairments and functional limitations (see below for any additional details).  Upon hospital discharge, pt would benefit from SNF.   ?   ? ?Recommendations for follow up therapy are one component of a multi-disciplinary discharge planning process, led by the  attending physician.  Recommendations may be updated based on patient status, additional functional criteria and insurance authorization. ? ?Follow Up Recommendations Skilled nursing-short term rehab (<3 hours/day) ? ?  ?Assistance Recommended at Discharge Frequent or constant Supervision/Assistance  ?Patient can return home with the following ? A lot of help with walking and/or transfers;A lot of help with bathing/dressing/bathroom;Direct supervision/assist for financial management;Assistance with cooking/housework;Help with stairs or ramp for entrance;Assist for transportation ? ?  ?Equipment Recommendations  (TBD at next facility)  ?Recommendations for Other Services ?    ?  ?Functional Status Assessment Patient has had a recent decline in their functional status and demonstrates the ability to make significant improvements in function in a reasonable and predictable amount of time.  ? ?  ?Precautions / Restrictions Precautions ?Precautions: Fall;Back ?Precaution Booklet Issued: No ?Required Braces or Orthoses: Spinal Brace ?Spinal Brace: Thoracolumbosacral orthotic;Applied in sitting position (for comfort only) ?Restrictions ?Weight Bearing Restrictions: No  ? ?  ? ?Mobility ? Bed Mobility ?Overal bed mobility: Needs Assistance ?Bed Mobility: Rolling, Sidelying to Sit, Sit to Sidelying ?Rolling: Mod assist (logrolling towards R side and later R side onto back) ?Sidelying to sit: Mod assist, Max assist (x2 trials; assist for trunk) ?  ?  ?Sit to sidelying: Mod assist, Max assist (assist for trunk mostly (a little bit of LE's)) ?General bed mobility comments: vc's for technique and spinal precautions ?  ? ?Transfers ?Overall transfer level:  (unable to sit long enough to attempt) ?  ?  ?  ?  ?  ?  ?  ?  ?  ?  ? ?Ambulation/Gait ?  ?  ?  ?  ?  ?  ?  ?  ? ?  Stairs ?  ?  ?  ?  ?  ? ?Wheelchair Mobility ?  ? ?Modified Rankin (Stroke Patients Only) ?  ? ?  ? ?Balance Overall balance assessment: Needs assistance, History  of Falls ?Sitting-balance support: Feet supported, Bilateral upper extremity supported ?Sitting balance-Leahy Scale: Poor ?Sitting balance - Comments: min to mod assist for sitting balance ?  ?  ?  ?  ?  ?  ?  ?  ?  ?  ?  ?  ?  ?  ?  ?   ? ? ? ?Pertinent Vitals/Pain Pain Assessment ?Pain Assessment: Faces ?Faces Pain Scale: Hurts a little bit (2/10 at rest; 8/10 briefly in sitting) ?Pain Location: low back ?Pain Descriptors / Indicators: Discomfort, Grimacing, Guarding ?Pain Intervention(s): Limited activity within patient's tolerance, Monitored during session, Repositioned ?HR WFL (79-98 bpm) during sessions activities.  ? ? ?Home Living Family/patient expects to be discharged to:: Private residence ?  ?Available Help at Discharge: Family;Available PRN/intermittently ?Type of Home: House ?Home Access: Ramped entrance ?  ?  ?  ?Home Layout: One level ?Home Equipment: Rollator (4 wheels);BSC/3in1;Shower seat;Toilet riser;Grab bars - toilet;Hand held shower head;Grab bars - tub/shower;Adaptive equipment (lift chair) ?Additional Comments: Pt lives with grandchildren who work during the day.  ?  ?Prior Function Prior Level of Function : Independent/Modified Independent;History of Falls (last six months) ?  ?  ?  ?  ?  ?  ?Mobility Comments: Per recent therapy notes "Pt reports she stays in bed or ambulates ~5 ft at most to lift chair & BSC. Doesn't ambulate farther than that during the day because she & grandson are fearful of her falling (pt reports excessive number of falls in the past 6 months)." ?ADLs Comments: Per recent therapy notes "Reports she either eats a pack of crackers for lunch or HHPT/HHOT will fix her something at the end of their session prior to their departure." ?  ? ? ?Hand Dominance  ? Dominant Hand: Right ? ?  ?Extremity/Trunk Assessment  ? Upper Extremity Assessment ?Upper Extremity Assessment: Generalized weakness ?  ? ?Lower Extremity Assessment ?Lower Extremity Assessment: Generalized weakness  (at least 3/5 AROM hip flexion, knee flexion/extension, and DF/PF) ?  ? ?Cervical / Trunk Assessment ?Cervical / Trunk Assessment: Back Surgery  ?Communication  ? Communication: HOH  ?Cognition Arousal/Alertness: Awake/alert ?Behavior During Therapy: Anxious ?Overall Cognitive Status:  (Oriented to person, place, general situation, and year (pt reported it was the 11th month of the year)) ?  ?  ?  ?  ?  ?  ?  ?  ?  ?  ?  ?  ?  ?  ?  ?  ?  ?  ?  ? ?  ?General Comments  Nursing cleared pt for participation in physical therapy.  Pt agreeable to PT session. ? ?  ?Exercises    ? ?Assessment/Plan  ?  ?PT Assessment Patient needs continued PT services  ?PT Problem List Decreased strength;Decreased mobility;Decreased safety awareness;Decreased knowledge of precautions;Decreased activity tolerance;Cardiopulmonary status limiting activity;Decreased knowledge of use of DME;Decreased balance;Pain;Decreased cognition ? ?   ?  ?PT Treatment Interventions DME instruction;Therapeutic activities;Modalities;Gait training;Therapeutic exercise;Patient/family education;Stair training;Balance training;Functional mobility training;Neuromuscular re-education;Manual techniques   ? ?PT Goals (Current goals can be found in the Care Plan section)  ?Acute Rehab PT Goals ?Patient Stated Goal: improve pain and mobility ?PT Goal Formulation: With patient ?Time For Goal Achievement: 06/10/21 ?Potential to Achieve Goals: Fair ? ?  ?Frequency 7X/week ?  ? ? ?Co-evaluation   ?  ?  ?  ?  ? ? ?  ?  AM-PAC PT "6 Clicks" Mobility  ?Outcome Measure Help needed turning from your back to your side while in a flat bed without using bedrails?: A Lot ?Help needed moving from lying on your back to sitting on the side of a flat bed without using bedrails?: A Lot ?Help needed moving to and from a bed to a chair (including a wheelchair)?: Total ?Help needed standing up from a chair using your arms (e.g., wheelchair or bedside chair)?: Total ?Help needed to walk in  hospital room?: Total ?Help needed climbing 3-5 steps with a railing? : Total ?6 Click Score: 8 ? ?  ?End of Session Equipment Utilized During Treatment: Oxygen ?Activity Tolerance: Patient limited by fat

## 2021-05-27 NOTE — Plan of Care (Signed)
? ?  Interdisciplinary Goals of Care Family Meeting ? ? ?Date carried out: 05/27/2021 ? ?Location of the meeting: Bedside ? ?Member's involved: Physician, Bedside Registered Nurse, and Family Member or next of kin ? ? ? ?Code status: Full DNR ? ?Disposition: Continue current acute care ? ? ? ? ? ? ?GOALS OF CARE DISCUSSION ? ?The Clinical status was relayed to family in detail- ? ?Updated and notified of patients medical condition- ?PATIENT IS ALERT AND AWAKE, AOx4 ?ABLE TO UNDERSTAND CONCERVASIONS ?SON AT BEDSIDE ?DICUSSED CODE STATUS ? ?PATIENT CLEARLY STATES THAT SHE DOES NOT WANT CPR ?SHE DOES NOT WANT BIPAP/CPAP ?SHE DOES NOT WANT INTUBATION ? ?PATIENT IS NOW DNR/DNI ?WILL CONTINUE MEDICAL CARE ? ? ?Explained to family course of therapy and the modalities  ? ? ?Family understands the situation. ? ? ?Family are satisfied with Plan of action and management. All questions answered ? ?Additional CC time 25 mins ? ? ?Corrin Parker, M.D.  ?[facility]  ?Medical Director [place] ?Medical Director [facility] Cardio-Pulmonary Department  ? ? ?

## 2021-05-27 NOTE — Progress Notes (Signed)
? ?NAME:  Kerri Carter, MRN:  [id number], DOB:  Oct 17, 1940, LOS: 4 ?ADMISSION DATE:  05/23/2021, CONSULTATION DATE:  05/26/2021 ?REFERRING MD:  Dr. Posey Pronto, CHIEF COMPLAINT:  Acute on Chronic Hypoxic Respiratory Failure  ? ?Brief Pt Description / Synopsis:  ?Kerri Carter is a 81 year old female with a PMH significant for HFpEF (LVEF 50%, g1DD 05/16/21), COPD on chronic 2L supplemental O2, CAD, hypertension, hyperlipidemia, PAD, CKD3 with history of R nephrectomy 1988 who presented to [facility] on 05/23/21 for elective lumbar fusion extension with Dr. [name].  On POD3 (4/24) she developed Acute on Chronic Hypoxic Respiratory Failure in the setting of Acute Decompensated HFpEF, questionable pneumonia, questionable PE, & AECOPD requiring BiPAP. ? ?History of Present Illness:  ?Kerri Carter is a 81 year old female with an extensive past medical history as listed below who presented to [facility] on 05/23/2021 for elective lumbar fusion extension with Dr. [name]. ? ?On 12/11/2020 she underwent left L2-L3 microdiscectomy and left L3-L4 and L5-S1 decompression.  She failed conservative management of her condition with worsening pain, therefore decision was made to proceed with extension of lumbar fusion to L2.  Postop she exhibited expected back pain, and was working with PT/OT.  Patient was noted to refuse Lovenox for DVT prophylaxis. ? ?Hospital Course: ?Early in the morning on 05/26/2021 (postop day 3) she developed acute hypoxia and acute dyspnea.  She reported associated dry cough, wheezing, orthopnea, and worsening lower extremity edema.  She denied fever, chills, chest pain, palpitations, dizziness, headache. ? ?SPO2 was initially 90% on 4 L of supplemental O2, decreased down to 75% of which she was subsequent placed on 6 L with minimal improvement.  She was then placed on high flow nasal cannula 15 L supplemental O2 with improvement in sats to 90 to 91% ?Vital Signs: Temperature 97.9 ?F orally, respiratory rate 20, pulse 91, blood  pressure 113/65, SPO2 84% on 6 L nasal cannula ?Significant Labs: Potassium 5.6, BUN 34, creatinine 1.65, BNP 995, high-sensitivity troponin 52, WBC 25.1 with neutrophilia, D-dimer 1.52, hemoglobin 9.7, hematocrit 32 ?ABG on high flow nasal cannula: pH 746/PCO2 43/PO2 60/bicarb 30.6 ?Imaging ?Chest X-ray>>IMPRESSION: ?Bilateral pulmonary opacification with hazy appearance suggesting ?pleural fluid and atelectasis. Asymmetric rounded density on the ?left could be superimposed pneumonia. ?Medications Administered: 40 mg IV Lasix, Azithromycin & Unasyn ? ?Hospitalist were consulted for work-up of acute on chronic hypoxic respiratory failure.  Cardiology was consulted due to acute CHF exacerbation.  Despite interventions, she required BiPAP due to her work of breathing.  PCCM is consulted for further assistance. ? ?Pertinent  Medical History  ? ?Past Medical History:  ?Diagnosis Date  ? Anxiety   ? Aortic stenosis 07/09/2015  ? a.) TTE 07/06/2015: EF 55-60%; mild AS with MPG 13.0 mmHg.  ? Asthma   ? Bipolar disorder ([place])   ? C. difficile diarrhea 2010  ? a.) following ABX course during hospital admission  ? Carotid atherosclerosis, bilateral   ? a.) Moderate; < 50% stenosis BILATERAL ICAs.  ? Cataract   ? a.) s/p BILATERAL extraction  ? Chicken pox   ? CKD (chronic kidney disease), stage III ([place])   ? a. s/p R nephrectomy./ aneurysm  ? Conversion disorder   ? COPD (chronic obstructive pulmonary disease) ([place])   ? Depression   ? Essential hypertension   ? GERD (gastroesophageal reflux disease)   ? Heart murmur   ? Hyperlipidemia   ? ILD (interstitial lung disease) ([place])   ? mild; 2/2 RA diagnosis  ? Inflammatory arthritis   ?  a. hands/carpal tunnel.  b. Low titer rheumatoid factor. c. Negative anti-CCP antibodies. d. Plaquenil.  ? Macular degeneration   ? Nocturnal hypoxemia   ? Non-Obstructive CAD   ? a. 07/2009 Cath ([facility]): nonobs dzs;  b. 03/2011 Cath [facility]): nonobs dzs.  ? Osteoarthritis   ? a. Knees.  ? PAD  (peripheral artery disease) ([place])   ? PUD (peptic ulcer disease)   ? S/P right hip fracture   ? 11/01/16 s/p repair  ? Shoulder pain   ? Sleep apnea   ? no cpap / minimal  ? Spinal stenosis at L4-L5 level   ? severe with L4/L5 anterolisthesis grade 1 anterolisthesis   ? Toxic maculopathy   ? Valvular heart disease   ? a.) TTE 07/06/2015: EF 55-60%; Mild MR/AR/TR; Mild AS with MPG 13.0 mmHg.  ? ? ?Micro Data:  ?4/19>>Multiple species present, suggest recollection  ?4/24: Blood culture x2>>negative  ?4/24: Surgical PCR Screen>>negative  ? ? ?Antimicrobials:  ?Azithromycin 4/24>> ?Unasyn 4/24>> ? ?Significant Hospital Events: ?Including procedures, antibiotic start and stop dates in addition to other pertinent events   ?4/21: Underwent elective lumbar fusion with Dr. [name] ?4/22: Declining DVT prophylaxis (Lovenox). ?4/24: Acute Hypoxia and Respiratory Distress requiring BiPAP.  Hospitalist and PCCM consulted.  Aggressive diuresis per Cardiology, empirically covering for pneumonia.  Rule out DVT/PE. ?4/24: NM Pulmonary Perfusion pulmonary embolism absent ?4/24: Echo revealed EF 50 to 55%, left grade II diastolic dysfunction (pseudonormalization) ?4/25: Pt stable on 15L HFNC, PCCM will sign off  ? ?Interim History / Subjective:  ?Pt stable on 15L HFNC pt confused to time and situation  ? ?Objective   ?Blood pressure (!) 84/67, pulse 93, temperature 98 ?F (36.7 ?C), temperature source Axillary, resp. rate (!) 22, height '5\' 1"'$  (1.549 m), weight 81.2 kg, SpO2 96 %. ?   ?FiO2 (%):  [50 %-95 %] 50 %  ? ?Intake/Output Summary (Last 24 hours) at 05/27/2021 5681 ?Last data filed at 05/27/2021 0428 ?Gross per 24 hour  ?Intake 373 ml  ?Output 750 ml  ?Net -377 ml  ? ?Filed Weights  ? 05/23/21 1841 05/26/21 1700 05/27/21 0446  ?Weight: 78.5 kg 81.2 kg 81.2 kg  ? ? ?Examination: ?General: Chronically ill-appearing female, sitting in bed, NAD on 15L HFNC ?HENT: Atraumatic, normocephalic, neck supple, no JVD  ?Lungs: Rhonchi on the  right and diminished all other lobes, even, non labored  ?Cardiovascular: NSR, rrr, s1s2, no R/G, 2+ radial/1+ distal, 1+ bilateral lower extremity edema  ?Abdomen: Soft, nontender, nondistended, no guarding or rebound tenderness, bowel sounds positive x4 ?Extremities: Normal bulk and tone, no deformities ?Neuro: Awake and alert, disoriented to time and situation, follows commands, no focal deficits ?Skin: JP drain draining serosanguinous drainage  ?GU: Purewick in place draining yellow urine  ? ?Resolved Hospital Problem list   ? ? ?Assessment & Plan:  ? ?Acute on Chronic Hypoxic Respiratory Failure in the setting of Acute decompensated HFpEF & AECOPD ?-Supplemental O2 as needed to maintain O2 sats 88 to 92% ?-Follow intermittent Chest X-ray & ABG as needed ?-Bronchodilators & Pulmicort nebs ?-Continue iv steroids  ?-ABX as above ?-Diuresis as BP and renal function permits ~ currently on Lasix 40 mg BID ?-Pulmonary toilet as able ? ? ?Acute Decompensated HFpEF (LVEF 50%-55%, grade II DD, severe MV regurgitation 05/26/21) ?Elevated Troponin, suspect demand ischemia ?PMHx: CAD ?-Continuous cardiac monitoring ?-Maintain MAP >65 ?-Trend HS Troponin until peaked ?-BNP 995 ?-Diuresis as BP and renal function permits ~ currently on Lasix 40 mg  BID ?-Cardiology following, appreciate input ? ?Pneumonia ?-Monitor fever curve ?-Trend WBC's & Procalcitonin ?-Follow cultures as above ?-Continue empiric Azithromycin & Unasyn for now pending cultures & sensitivities ? ?CKD Stage III (baseline Creatinine 1.25) ?-Monitor I&O's / urinary output ?-Follow BMP ?-Ensure adequate renal perfusion ?-Avoid nephrotoxic agents as able ?-Replace electrolytes as indicated ?-Follow creatinine closely with diuresis ? ?S/p Extension of Lumbar Fusion to L2 ?-Neurosurgery following, appreciate input ?-Pain control ?-Mobilize as able once respiratory status stabilized ? ?Best Practice (right click and "Reselect all SmartList Selections" daily)   ? ?Diet/type: Heart Healthy  ?DVT prophylaxis: LMWH ?GI prophylaxis: PPI ?Lines: N/A ?Foley:  N/A ?Code Status:  full code ?Last date of multidisciplinary goals of care discussion [05/27/2021] ? ?Will update pts fa

## 2021-05-27 NOTE — Progress Notes (Signed)
?[facility] CONSULT NOTE  ? ?    ?Patient ID: ?Kerri Carter ?MRN: [id number] ?DOB/AGE: [birth year] 81 y.o. ? ?Admit date: 05/23/2021 ?Referring Physician Dr. Eugenie Norrie ?Primary Physician Dr. Olivia Mackie McLean-Scocuzza ?Primary Cardiologist Laqueta Jean, PA-C ?Reason for Consultation acute CHF ? ?HPI: Kerri Carter is a 81 year old female with a PMH of HFpEF (LVEF 50%, g1DD 05/16/21), low flow/low gradient AS, COPD on 2L of O2 chronically, nonobstructive CAD by heart cath 03/2011, hypertension, hyperlipidemia, PAD, CKD3 with history of R nephrectomy 1988, depression, who presented to [facility] for a lumbar fusion extension on 05/23/21 with Dr. [name]. On POD3 (4/24) she became hypoxic in the early morning with associated shortness of breath, with an increased oxygen requirement from HFNC to BiPAP. Cardiology is consulted for further assistance.  ? ?Interval History: ?- transferred to stepdown due to desatting while on HFNC yesterday afternoon, on BIPAP overnight, back to 15L HFNC this morning looking much better ?- went into atrial fibrillation this morning, rate controlled in the 80s to low 100s.  ?- per nursing, purewick was leaking yesterday UOP inaccurate. On lasix '40mg'$  IV BID with almost a full urine canister during interview ?- echo resulted with preserved EF 50-55%, g2dd, severe MR, mi-mod AS ?- feels a little better this morning with less shortness of breath, denies palpitations, chest pain.  ? ?Review of systems complete and found to be negative unless listed above  ? ?Past Medical History:  ?Diagnosis Date  ? Anxiety   ? Aortic stenosis 07/09/2015  ? a.) TTE 07/06/2015: EF 55-60%; mild AS with MPG 13.0 mmHg.  ? Asthma   ? Bipolar disorder ([place])   ? C. difficile diarrhea 2010  ? a.) following ABX course during hospital admission  ? Carotid atherosclerosis, bilateral   ? a.) Moderate; < 50% stenosis BILATERAL ICAs.  ? Cataract   ? a.) s/p BILATERAL extraction  ? Chicken pox   ? CKD (chronic kidney disease),  stage III ([place])   ? a. s/p R nephrectomy./ aneurysm  ? Conversion disorder   ? COPD (chronic obstructive pulmonary disease) ([place])   ? Depression   ? Essential hypertension   ? GERD (gastroesophageal reflux disease)   ? Heart murmur   ? Hyperlipidemia   ? ILD (interstitial lung disease) ([place])   ? mild; 2/2 RA diagnosis  ? Inflammatory arthritis   ? a. hands/carpal tunnel.  b. Low titer rheumatoid factor. c. Negative anti-CCP antibodies. d. Plaquenil.  ? Macular degeneration   ? Nocturnal hypoxemia   ? Non-Obstructive CAD   ? a. 07/2009 Cath ([facility]): nonobs dzs;  b. 03/2011 Cath [facility]): nonobs dzs.  ? Osteoarthritis   ? a. Knees.  ? PAD (peripheral artery disease) ([place])   ? PUD (peptic ulcer disease)   ? S/P right hip fracture   ? 11/01/16 s/p repair  ? Shoulder pain   ? Sleep apnea   ? no cpap / minimal  ? Spinal stenosis at L4-L5 level   ? severe with L4/L5 anterolisthesis grade 1 anterolisthesis   ? Toxic maculopathy   ? Valvular heart disease   ? a.) TTE 07/06/2015: EF 55-60%; Mild MR/AR/TR; Mild AS with MPG 13.0 mmHg.  ?  ?Past Surgical History:  ?Procedure Laterality Date  ? APPENDECTOMY    ? BACK SURGERY    ? lumbar fusion  ? BUNIONECTOMY Right   ? CATARACT EXTRACTION, BILATERAL    ? CESAREAN SECTION    ? x1  ? CHOLECYSTECTOMY N/A 05/11/2016  ? Procedure: LAPAROSCOPIC  CHOLECYSTECTOMY;  Surgeon: Florene Glen, MD;  Location: [facility] ORS;  Service: General;  Laterality: N/A;  ? COLONOSCOPY WITH PROPOFOL N/A 04/02/2016  ? Procedure: COLONOSCOPY WITH PROPOFOL;  Surgeon: Jonathon Bellows, MD;  Location: [facility] ENDOSCOPY;  Service: Endoscopy;  Laterality: N/A;  ? ENDOSCOPIC RETROGRADE CHOLANGIOPANCREATOGRAPHY (ERCP) WITH PROPOFOL N/A 05/08/2016  ? Procedure: ENDOSCOPIC RETROGRADE CHOLANGIOPANCREATOGRAPHY (ERCP) WITH PROPOFOL;  Surgeon: Lucilla Lame, MD;  Location: [facility] ENDOSCOPY;  Service: Endoscopy;  Laterality: N/A;  ? ERCP    ? with biliary spincterotomy 05/08/16 Dr. Allen Norris for choledocholithiasis   ? ESOPHAGEAL DILATION  04/02/2016  ?  Procedure: ESOPHAGEAL DILATION;  Surgeon: Jonathon Bellows, MD;  Location: [facility] ENDOSCOPY;  Service: Endoscopy;;  ? ESOPHAGOGASTRODUODENOSCOPY (EGD) WITH PROPOFOL N/A 04/02/2016  ? Procedure: ESOPHAGOGASTRODUODENOSCOPY (EGD) WITH PROPOFOL;  Surgeon: Jonathon Bellows, MD;  Location: [facility] ENDOSCOPY;  Service: Endoscopy;  Laterality: N/A;  ? HIP ARTHROPLASTY Right 11/01/2016  ? Procedure: ARTHROPLASTY BIPOLAR HIP (HEMIARTHROPLASTY);  Surgeon: Corky Mull, MD;  Location: [facility] ORS;  Service: Orthopedics;  Laterality: Right;  ? LUMBAR LAMINECTOMY/DECOMPRESSION MICRODISCECTOMY N/A 12/11/2020  ? Procedure: LEFT L2-3 MICRODISCECTOMY, L3-4 AND L5-S1 DECOMPRESSION;  Surgeon: Meade Maw, MD;  Location: [facility] ORS;  Service: Neurosurgery;  Laterality: N/A;  ? NEPHRECTOMY  1988  ? 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 ORIF SCAPHOID FRACTURE Left 12/21/2019  ? Procedure: OPEN REDUCTION INTERNAL FIXATION (ORIF) OF LEFT SCAPULAR NONUNION WITH BONE GRAFT;  Surgeon: Corky Mull, MD;  Location: [facility] ORS;  Service: Orthopedics;  Laterality: Left;  ? osteoporosis    ? noted DEXA 08/19/16   ? REPLACEMENT TOTAL KNEE Right   ? REVERSE SHOULDER ARTHROPLASTY Right 11/04/2017  ? Procedure: REVERSE SHOULDER ARTHROPLASTY;  Surgeon: Corky Mull, MD;  Location: [facility] ORS;  Service: Orthopedics;  Laterality: Right;  ? REVERSE SHOULDER ARTHROPLASTY Left 07/26/2018  ? Procedure: REVERSE SHOULDER ARTHROPLASTY;  Surgeon: Corky Mull, MD;  Location: [facility] ORS;  Service: Orthopedics;  Laterality: Left;  ? TONSILLECTOMY    ? TOTAL HIP ARTHROPLASTY  12/10/11  ? [place] left hip  ? TOTAL HIP ARTHROPLASTY Bilateral   ? TUBAL LIGATION    ?  ?Medications Prior to Admission  ?Medication Sig Dispense Refill Last Dose  ? Adalimumab 40 MG/0.4ML PNKT Inject 40 mg into the skin every 14 (fourteen) days.   Past Month  ? albuterol (VENTOLIN HFA) 108 (90 Base) MCG/ACT inhaler Inhale 2 puffs into the lungs every 6 (six) hours as needed for wheezing  or shortness of breath. 18 g 11 05/23/2021  ? amLODipine (NORVASC) 2.5 MG tablet Take 1 tablet (2.5 mg total) by mouth daily. (Patient taking differently: Take 5 mg by mouth 2 (two) times daily.) 90 tablet 3 05/23/2021  ? amoxicillin-clavulanate (AUGMENTIN) 875-125 MG tablet Take 1 tablet by mouth 2 (two) times daily. With food x 7-10 days 20 tablet 0 05/22/2021  ? benzonatate (TESSALON) 200 MG capsule Take 1 capsule (200 mg total) by mouth 3 (three) times daily as needed for cough. 40 capsule 0 05/22/2021  ? budesonide (PULMICORT) 0.25 MG/2ML nebulizer solution USE 1 VIAL  IN  NEBULIZER TWICE  DAILY - rinse mouth after treatment 60 mL 11 05/23/2021  ? Cholecalciferol (VITAMIN D3) 50 MCG (2000 UT) TABS Take 4,000 Units by mouth daily.   05/22/2021  ? clopidogrel (PLAVIX) 75 MG tablet Take 75 mg by mouth daily.   05/16/2021  ? cyclobenzaprine (FLEXERIL) 10 MG tablet Take 1 tablet (10 mg total) by mouth 3 (  three) times daily as needed for muscle spasms. 30 tablet 0 05/22/2021  ? dicyclomine (BENTYL) 10 MG capsule Take 1 capsule (10 mg total) by mouth 2 (two) times daily as needed for spasms. (Patient taking differently: Take 20 mg by mouth 2 (two) times daily.) 180 capsule 3 05/23/2021  ? erythromycin ophthalmic ointment Place 1 application. into both eyes 3 (three) times daily. X4-7 days 3.5 g 0 05/22/2021  ? escitalopram (LEXAPRO) 10 MG tablet TAKE 1 TABLET BY MOUTH EVERY DAY 90 tablet 1 05/22/2021  ? famotidine (PEPCID) 20 MG tablet TAKE 1 TABLET (20 MG TOTAL) BY MOUTH DAILY. BEFORE BREAKFAST OR DINNER 90 tablet 1 05/23/2021  ? fluconazole (DIFLUCAN) 150 MG tablet TAKE 1 TABLET (150 MG TOTAL) BY MOUTH ONCE FOR 1 DOSE. REPEAT 2ND DOSE IN 3-7 DAYS AS NEEDED 3 tablet 0 05/22/2021  ? fluticasone (FLONASE) 50 MCG/ACT nasal spray Place 2 sprays into both nostrils daily. In am   05/22/2021  ? gabapentin (NEURONTIN) 300 MG capsule Take 2 capsules (600 mg total) by mouth in the morning and at bedtime. 360 capsule 3 05/23/2021  ?  ipratropium-albuterol (DUONEB) 0.5-2.5 (3) MG/3ML SOLN TAKE 3 MLS BY NEBULIZATION 3 (THREE) TIMES DAILY AS NEEDED. (Patient taking differently: Take 3 mLs by nebulization 2 (two) times daily.) 360 mL 3 05/22/2021

## 2021-05-27 NOTE — Progress Notes (Signed)
Clinical/Bedside Swallow Evaluation ?Patient Details  ?Name: Kerri Carter ?MRN: [id number] ?Date of Birth: 05-Oct-1940 ? ?Today's Date: 05/27/2021 ?Time: SLP Start Time (ACUTE ONLY): 1194 SLP Stop Time (ACUTE ONLY): 1041 ?SLP Time Calculation (min) (ACUTE ONLY): 9 min ? ?Past Medical History:  ?Past Medical History:  ?Diagnosis Date  ? Anxiety   ? Aortic stenosis 07/09/2015  ? a.) TTE 07/06/2015: EF 55-60%; mild AS with MPG 13.0 mmHg.  ? Asthma   ? Bipolar disorder ([place])   ? C. difficile diarrhea 2010  ? a.) following ABX course during hospital admission  ? Carotid atherosclerosis, bilateral   ? a.) Moderate; < 50% stenosis BILATERAL ICAs.  ? Cataract   ? a.) s/p BILATERAL extraction  ? Chicken pox   ? CKD (chronic kidney disease), stage III ([place])   ? a. s/p R nephrectomy./ aneurysm  ? Conversion disorder   ? COPD (chronic obstructive pulmonary disease) ([place])   ? Depression   ? Essential hypertension   ? GERD (gastroesophageal reflux disease)   ? Heart murmur   ? Hyperlipidemia   ? ILD (interstitial lung disease) ([place])   ? mild; 2/2 RA diagnosis  ? Inflammatory arthritis   ? a. hands/carpal tunnel.  b. Low titer rheumatoid factor. c. Negative anti-CCP antibodies. d. Plaquenil.  ? Macular degeneration   ? Nocturnal hypoxemia   ? Non-Obstructive CAD   ? a. 07/2009 Cath ([facility]): nonobs dzs;  b. 03/2011 Cath [facility]): nonobs dzs.  ? Osteoarthritis   ? a. Knees.  ? PAD (peripheral artery disease) ([place])   ? PUD (peptic ulcer disease)   ? S/P right hip fracture   ? 11/01/16 s/p repair  ? Shoulder pain   ? Sleep apnea   ? no cpap / minimal  ? Spinal stenosis at L4-L5 level   ? severe with L4/L5 anterolisthesis grade 1 anterolisthesis   ? Toxic maculopathy   ? Valvular heart disease   ? a.) TTE 07/06/2015: EF 55-60%; Mild MR/AR/TR; Mild AS with MPG 13.0 mmHg.  ? ?Past Surgical History:  ?Past Surgical History:  ?Procedure Laterality Date  ? APPENDECTOMY    ? APPLICATION OF INTRAOPERATIVE CT SCAN N/A 05/23/2021  ? Procedure:  APPLICATION OF INTRAOPERATIVE CT SCAN;  Surgeon: Meade Maw, MD;  Location: [facility] ORS;  Service: Neurosurgery;  Laterality: N/A;  ? BACK SURGERY    ? lumbar fusion  ? BUNIONECTOMY Right   ? CATARACT EXTRACTION, BILATERAL    ? CESAREAN SECTION    ? x1  ? CHOLECYSTECTOMY N/A 05/11/2016  ? Procedure: LAPAROSCOPIC CHOLECYSTECTOMY;  Surgeon: Florene Glen, MD;  Location: [facility] ORS;  Service: General;  Laterality: N/A;  ? COLONOSCOPY WITH PROPOFOL N/A 04/02/2016  ? Procedure: COLONOSCOPY WITH PROPOFOL;  Surgeon: Jonathon Bellows, MD;  Location: [facility]COPY;  Service: Endoscopy;  Laterality: N/A;  ? ENDOSCOPIC RETROGRADE CHOLANGIOPANCREATOGRAPHY (ERCP) WITH PROPOFOL N/A 05/08/2016  ? Procedure: ENDOSCOPIC RETROGRADE CHOLANGIOPANCREATOGRAPHY (ERCP) WITH PROPOFOL;  Surgeon: Lucilla Lame, MD;  Location: [facility] ENDOSCOPY;  Service: Endoscopy;  Laterality: N/A;  ? ERCP    ? with biliary spincterotomy 05/08/16 Dr. Allen Norris for choledocholithiasis   ? ESOPHAGEAL DILATION  04/02/2016  ? Procedure: ESOPHAGEAL DILATION;  Surgeon: Jonathon Bellows, MD;  Location: [facility] ENDOSCOPY;  Service: Endoscopy;;  ? ESOPHAGOGASTRODUODENOSCOPY (EGD) WITH PROPOFOL N/A 04/02/2016  ? Procedure: ESOPHAGOGASTRODUODENOSCOPY (EGD) WITH PROPOFOL;  Surgeon: Jonathon Bellows, MD;  Location: [facility] ENDOSCOPY;  Service: Endoscopy;  Laterality: N/A;  ? HIP ARTHROPLASTY Right 11/01/2016  ? Procedure: ARTHROPLASTY BIPOLAR HIP (HEMIARTHROPLASTY);  Surgeon:  Poggi, Marshall Cork, MD;  Location: [facility] ORS;  Service: Orthopedics;  Laterality: Right;  ? LUMBAR LAMINECTOMY/DECOMPRESSION MICRODISCECTOMY N/A 12/11/2020  ? Procedure: LEFT L2-3 MICRODISCECTOMY, L3-4 AND L5-S1 DECOMPRESSION;  Surgeon: Meade Maw, MD;  Location: [facility] ORS;  Service: Neurosurgery;  Laterality: N/A;  ? NEPHRECTOMY  1988  ? 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 ORIF SCAPHOID FRACTURE Left 12/21/2019  ? Procedure: OPEN REDUCTION INTERNAL FIXATION (ORIF) OF LEFT SCAPULAR NONUNION WITH BONE  GRAFT;  Surgeon: Corky Mull, MD;  Location: [facility] ORS;  Service: Orthopedics;  Laterality: Left;  ? osteoporosis    ? noted DEXA 08/19/16   ? REPLACEMENT TOTAL KNEE Right   ? REVERSE SHOULDER ARTHROPLASTY Right 11/04/2017  ? Procedure: REVERSE SHOULDER ARTHROPLASTY;  Surgeon: Corky Mull, MD;  Location: [facility] ORS;  Service: Orthopedics;  Laterality: Right;  ? REVERSE SHOULDER ARTHROPLASTY Left 07/26/2018  ? Procedure: REVERSE SHOULDER ARTHROPLASTY;  Surgeon: Corky Mull, MD;  Location: [facility] ORS;  Service: Orthopedics;  Laterality: Left;  ? TONSILLECTOMY    ? TOTAL HIP ARTHROPLASTY  12/10/11  ? [place] left hip  ? TOTAL HIP ARTHROPLASTY Bilateral   ? TRANSFORAMINAL LUMBAR INTERBODY FUSION (TLIF) WITH PEDICLE SCREW FIXATION 3 LEVEL N/A 05/23/2021  ? Procedure: OPEN L2-4 TRANSFORAMINAL LUMBAR INTERBODY FUSION (TLIF) WITH L2-5 POSTERIOR SPINAL FUSION;  Surgeon: Meade Maw, MD;  Location: [facility] ORS;  Service: Neurosurgery;  Laterality: N/A;  ? TUBAL LIGATION    ? ?HPI:  ?Patient is a 81 y.o. female with PMH including heart failure, COPD on 2L home O2 at night, CAD, HTN, HLD, PAD, CKD3, R nephrectomy, admitted 05/23/21 for elective lumbar fusion extension. Postoperatively she developed acute on chronic hypoxic respiratory failure in setting of acute decompensated HFpEF, questionable pneumonia, questionable PE and AECOPD requiring BiPAP.  4/24 Admitted to ICU, Pulmonary perfusion: "pulmonary embolism absent." Now stable on 15L HFNC. Per chart review, pt has remote history of esophageal dilation, most recent EGD in 2018 showed "normal esophagus." CXR 05/27/21: "diffuse bilateral interstitial opacity, more focal and masslike in the left  midlung, and layering bilateral pleural effusions."  ?  ?Assessment / Plan / Recommendation  ?Clinical Impression ? Patient appears to present with adequate airway protection during bedside swallow evaluation, which was somewhat limited due to pt's pain which limited her ability to  tolerate upright positioning for consuming POs for an extended amount of time. Pt allowed repositioning and forward tilt of the bed to facilitate more upright position for eating. She denies history of swallowing difficulty currently or previously. At baseline pt RR ranging 18-23, Sp02 97 on 15 L via HFNC. Pt self-fed thin liquids via straw, puree via tsp (x1) and 1 graham cracker without overt s/sx aspiration, appearance of adequate/timely mastication, oral transfer, and swallow initiation. Unable to assess for fatigue as pt requesting to recline. Pt, RN report pt consumed all of breakfast (pancakes) without difficulty this morning. Education provided to pt and discussed precautions with RN re: aspiration precautions given increased 02 needs. Recommend continue current diet with aspiration precautions: ensure respiratory rate remains below 30, take rest breaks to allow for recovery, swallow-breathing reciprocity, and position upright for POs. Given limitations of today's assessment, potential impacts of pulmonary status on pt's swallow function, SLP to follow up x1 for diet tolerance and aspiration precaution training, preferrably at mealtime.  ? ? ? ?SLP Visit Diagnosis: Dysphagia, unspecified (R13.10) ?   ?Aspiration Risk ? Mild aspiration risk  ?  ?Diet Recommendation Regular;Thin  liquid  ? ?Liquid Administration via: Cup;Straw ?Medication Administration: Whole meds with liquid ?Supervision: Patient able to self feed;Comment (monitor respiratory status; ensure RR remains below 30) ?Compensations: Slow rate;Small sips/bites;Other (Comment) (rest breaks for recovery) ?Postural Changes: Seated upright at 90 degrees  ?  ?Other  Recommendations Oral Care Recommendations: Oral care BID   ? ?Recommendations for follow up therapy are one component of a multi-disciplinary discharge planning process, led by the attending physician.  Recommendations may be updated based on patient status, additional functional criteria and  insurance authorization. ? ?Follow up Recommendations    ? ? ?  ?Assistance Recommended at Discharge    ?Functional Status Assessment Patient has had a recent decline in their functional status and demonstrates the abilit

## 2021-05-27 NOTE — Progress Notes (Signed)
? ?   Attending Progress Note ? ?History: BELEN PESCH is here for adjacent segment segment disease with spondylolisthesis in the lumbar spine as well as lumbar radiculopathy. ? ?POD4: transferred to ICU yesterday. ? ?POD3: suffered from hypoxia overnight was increased O2 requirement. Hosptialist consulted for assistance.  ? ?POD2: Doing well with continued back pain.   ? ?POD1: Expected post-op back pain.  Her legs feel better. ? ?Physical Exam: ?Vitals:  ? 05/27/21 0600 05/27/21 0700  ?BP: 126/63 (!) 84/67  ?Pulse: 75 93  ?Resp: (!) 24 (!) 22  ?Temp:    ?SpO2: 92% 96%  ? ? ?AA Ox2 ?CNI ? ?Strength:5/5 throughout BLE ?Drain ?Superficial 0 ?Incision c/d/I with clean dressing in place ? ?Data: ? ?Recent Labs  ?Lab 05/26/21 ?3734  ?NA 135  ?K 5.6*  ?CL 102  ?CO2 27  ?BUN 34*  ?CREATININE 1.65*  ?GLUCOSE 142*  ?CALCIUM 8.1*  ? ? ?No results for input(s): AST, ALT, ALKPHOS in the last 168 hours. ? ?Invalid input(s): TBILI  ? Recent Labs  ?Lab 05/23/21 ?1447 05/26/21 ?0442  ?WBC 15.8* 25.1*  ?HGB 9.9* 9.7*  ?HCT 32.3* 32.0*  ?PLT 259 250  ? ? ?No results for input(s): APTT, INR in the last 168 hours.  ?   ? ? ?Other tests/results: n/a ? ?Assessment/Plan: ? ?RAMEY KETCHERSIDE is doing well after extension of lumbar fusion to L2. ? ?- mobilize ?- pain control ?- pt cleared for DVT ppx from neurosurgical standpoint  ?- PTOT ?- Remove foley 4/23. Pure wick in place ?- removed superficial drain and wound vac. Incision redressed with clean dressing.  ?- appreciate medicines assistance with medical management.  ? ?Cooper Render  ?Department of Neurosurgery ? ? ? ?

## 2021-05-28 ENCOUNTER — Inpatient Hospital Stay: Payer: Medicare PPO

## 2021-05-28 DIAGNOSIS — J9601 Acute respiratory failure with hypoxia: Secondary | ICD-10-CM | POA: Diagnosis not present

## 2021-05-28 DIAGNOSIS — J441 Chronic obstructive pulmonary disease with (acute) exacerbation: Secondary | ICD-10-CM

## 2021-05-28 DIAGNOSIS — J690 Pneumonitis due to inhalation of food and vomit: Secondary | ICD-10-CM | POA: Diagnosis not present

## 2021-05-28 DIAGNOSIS — N189 Chronic kidney disease, unspecified: Secondary | ICD-10-CM

## 2021-05-28 DIAGNOSIS — N179 Acute kidney failure, unspecified: Secondary | ICD-10-CM

## 2021-05-28 LAB — HEMOGLOBIN A1C
Hgb A1c MFr Bld: 6 % — ABNORMAL HIGH (ref 4.8–5.6)
Mean Plasma Glucose: 125.5 mg/dL

## 2021-05-28 LAB — BASIC METABOLIC PANEL
Anion gap: 10 (ref 5–15)
BUN: 51 mg/dL — ABNORMAL HIGH (ref 8–23)
CO2: 26 mmol/L (ref 22–32)
Calcium: 8 mg/dL — ABNORMAL LOW (ref 8.9–10.3)
Chloride: 100 mmol/L (ref 98–111)
Creatinine, Ser: 1.54 mg/dL — ABNORMAL HIGH (ref 0.44–1.00)
GFR, Estimated: 34 mL/min — ABNORMAL LOW (ref >60)
Glucose, Bld: 174 mg/dL — ABNORMAL HIGH (ref 70–99)
Potassium: 4.8 mmol/L (ref 3.5–5.1)
Sodium: 136 mmol/L (ref 135–145)

## 2021-05-28 LAB — EXPECTORATED SPUTUM ASSESSMENT W GRAM STAIN, RFLX TO RESP C

## 2021-05-28 LAB — CBC
HCT: 28.6 % — ABNORMAL LOW (ref 36.0–46.0)
Hemoglobin: 9.3 g/dL — ABNORMAL LOW (ref 12.0–15.0)
MCH: 27.8 pg (ref 26.0–34.0)
MCHC: 32.5 g/dL (ref 30.0–36.0)
MCV: 85.4 fL (ref 80.0–100.0)
Platelets: 251 10*3/uL (ref 150–400)
RBC: 3.35 MIL/uL — ABNORMAL LOW (ref 3.87–5.11)
RDW: 15.1 % (ref 11.5–15.5)
WBC: 14.4 10*3/uL — ABNORMAL HIGH (ref 4.0–10.5)
nRBC: 0.6 % — ABNORMAL HIGH (ref 0.0–0.2)

## 2021-05-28 LAB — GLUCOSE, CAPILLARY
Glucose-Capillary: 149 mg/dL — ABNORMAL HIGH (ref 70–99)
Glucose-Capillary: 164 mg/dL — ABNORMAL HIGH (ref 70–99)
Glucose-Capillary: 183 mg/dL — ABNORMAL HIGH (ref 70–99)
Glucose-Capillary: 192 mg/dL — ABNORMAL HIGH (ref 70–99)
Glucose-Capillary: 198 mg/dL — ABNORMAL HIGH (ref 70–99)
Glucose-Capillary: 210 mg/dL — ABNORMAL HIGH (ref 70–99)
Glucose-Capillary: 231 mg/dL — ABNORMAL HIGH (ref 70–99)

## 2021-05-28 LAB — PROCALCITONIN: Procalcitonin: 0.61 ng/mL

## 2021-05-28 MED ORDER — SALINE SPRAY 0.65 % NA SOLN
1.0000 | NASAL | Status: DC | PRN
  Filled 2021-05-28: qty 44

## 2021-05-28 MED ORDER — INSULIN ASPART 100 UNIT/ML IJ SOLN
0.0000 [IU] | Freq: Every day | INTRAMUSCULAR | Status: DC
  Administered 2021-05-31: 2 [IU] via SUBCUTANEOUS
  Filled 2021-05-28: qty 1

## 2021-05-28 MED ORDER — INSULIN ASPART 100 UNIT/ML IJ SOLN
0.0000 [IU] | Freq: Three times a day (TID) | INTRAMUSCULAR | Status: DC
  Administered 2021-05-28: 3 [IU] via SUBCUTANEOUS
  Administered 2021-05-28: 2 [IU] via SUBCUTANEOUS
  Administered 2021-05-29: 3 [IU] via SUBCUTANEOUS
  Administered 2021-05-29 (×2): 2 [IU] via SUBCUTANEOUS
  Administered 2021-05-30 (×2): 3 [IU] via SUBCUTANEOUS
  Administered 2021-05-31: 2 [IU] via SUBCUTANEOUS
  Administered 2021-05-31: 3 [IU] via SUBCUTANEOUS
  Administered 2021-06-01: 5 [IU] via SUBCUTANEOUS
  Administered 2021-06-01 – 2021-06-02 (×2): 2 [IU] via SUBCUTANEOUS
  Filled 2021-05-28 (×12): qty 1

## 2021-05-28 NOTE — Progress Notes (Addendum)
Occupational Therapy Treatment Patient Details Name: Kerri Carter MRN: [id number] DOB: 1940/10/28 Today's Date: 05/28/2021   History of present illness Pt is a 81 y/o F admitted to hospital who presents with spondylithesis and lumbar radiculopathy and has failed conservative management. Pt is now s/p L 3/4 & 2/3 fusion with posterolateral arthrodesis from L2 to L5.  Pt noted with acute hypoxia with dyspnea 4/24 and transferred to ICU (pt with acute respiratory failure with hypoxia, L LL PNA, acute on chronic combined systolic and diastolic CHF, AKI superimposed on CKD, and s/p lumbar fusion). PMH: anxiety, aortic stenosis, asthma, CKD, CVA, depression, GERD/PUD, retention, interstitial lung disease, PAD, bipolar disorder, conversion disorder, heart murmur, HLD.   OT comments  [name] was seen for OT/PT co- treatment on this date. New OT orders received after t/f to CCU however, pt does not require formal re-evaluation. Upon arrival to room pt awake/alert, semi-supine in bed, noted to be soiled with urine after pure wick failure. OT/PT facilitated ADL management and functional mobility as described below (see ADL section for additional detail). Pt requires +2 MAX A to don TLSO. Once donned she is able to progress to supervision for static standing and CGA for functional mobility during session. MAX A to don bilat hospital socks. Pt continues to benefit from skilled OT services to maximize return to PLOF and minimize risk of future falls, injury, caregiver burden, and readmission. Will continue to follow POC. Discharge recommendation remains appropriate.     Recommendations for follow up therapy are one component of a multi-disciplinary discharge planning process, led by the attending physician.  Recommendations may be updated based on patient status, additional functional criteria and insurance authorization.    Follow Up Recommendations  Skilled nursing-short term rehab (<3 hours/day)     Assistance Recommended at Discharge Intermittent Supervision/Assistance  Patient can return home with the following  A lot of help with walking and/or transfers;A lot of help with bathing/dressing/bathroom;Assist for transportation;Help with stairs or ramp for entrance   Equipment Recommendations  None recommended by OT    Recommendations for Other Services      Precautions / Restrictions Precautions Precautions: Fall;Back Precaution Booklet Issued: No Required Braces or Orthoses: Spinal Brace Spinal Brace: Thoracolumbosacral orthotic;Applied in sitting position Restrictions Weight Bearing Restrictions: No       Mobility Bed Mobility Overal bed mobility: Needs Assistance Bed Mobility: Rolling, Sidelying to Sit, Sit to Sidelying Rolling: Min assist Sidelying to sit: Mod assist     Sit to sidelying: Mod assist, Max assist General bed mobility comments: pt continues to require physical; assistance and vcs for proper technique. poor sitting balance at EOB    Transfers Overall transfer level: Needs assistance Equipment used: Rolling walker (2 wheels) Transfers: Sit to/from Stand Sit to Stand: Min assist, From elevated surface     Step pivot transfers: Min assist     General transfer comment: Pt was able to stand several times at EOB to RW with vcs for handplacement and +2 for safety.     Balance Overall balance assessment: Needs assistance Sitting-balance support: Feet supported, Bilateral upper extremity supported Sitting balance-Leahy Scale: Poor Sitting balance - Comments: pt has several occasions of LOB posteriorly while sitting EOB. +2 assistance for safety when applying TLSO. Author adjusted TLSO to proper fit.   Standing balance support: Reliant on assistive device for balance, Bilateral upper extremity supported, During functional activity Standing balance-Leahy Scale: Poor Standing balance comment: reliant on BUE support with all standing/dynamic RW  ADL either performed or assessed with clinical judgement   ADL Overall ADL's : Needs assistance/impaired                                       General ADL Comments: Pt is continues to be functionally limtied by increased back pain at rest and with mobility attempts. She requires MAX A +2 to don TLSO with increased posterior lean while seated EOB. Once TLSO donned she is able to progress to supervision for static standing and CGA for functional transfers in room. Pt rquires MAX A to don bilat hospital socks 2/2 limited LB access and back precautions this date.    Extremity/Trunk Assessment Upper Extremity Assessment Upper Extremity Assessment: Generalized weakness   Lower Extremity Assessment Lower Extremity Assessment: Generalized weakness   Cervical / Trunk Assessment Cervical / Trunk Assessment: Back Surgery    Vision Patient Visual Report: No change from baseline     Perception     Praxis      Cognition Arousal/Alertness: Awake/alert Behavior During Therapy: Anxious Overall Cognitive Status: Within Functional Limits for tasks assessed                                 General Comments: Pt is A and O but does present with severe anxiety. per pt," I use to take xanex everyday but I recently come off of it."        Exercises Other Exercises Other Exercises: OT/PT facilitated bed/functional mobility with education on safety, falls prevention, adherence to back precautions and energy conservation provided t/o session.    Shoulder Instructions       General Comments      Pertinent Vitals/ Pain       Pain Assessment Faces Pain Scale: Hurts a little bit Pain Location: low back Pain Descriptors / Indicators: Discomfort, Grimacing, Guarding Pain Intervention(s): Limited activity within patient's tolerance, Monitored during session, Repositioned, Premedicated before session  Home Living                                           Prior Functioning/Environment              Frequency  Min 2X/week        Progress Toward Goals  OT Goals(current goals can now be found in the care plan section)  Progress towards OT goals: Progressing toward goals  Acute Rehab OT Goals Patient Stated Goal: to have less pain OT Goal Formulation: With patient Time For Goal Achievement: 06/06/21 Potential to Achieve Goals: Good  Plan Discharge plan remains appropriate;Frequency remains appropriate    Co-evaluation    PT/OT/SLP Co-Evaluation/Treatment: Yes Reason for Co-Treatment: Complexity of the patient's impairments (multi-system involvement);Necessary to address cognition/behavior during functional activity;To address functional/ADL transfers PT goals addressed during session: Mobility/safety with mobility;Balance;Proper use of DME;Strengthening/ROM OT goals addressed during session: ADL's and self-care;Proper use of Adaptive equipment and DME      AM-PAC OT "6 Clicks" Daily Activity     Outcome Measure   Help from another person eating meals?: A Little Help from another person taking care of personal grooming?: A Little Help from another person toileting, which includes using toliet, bedpan, or urinal?: A Little Help from another person bathing (including washing,  rinsing, drying)?: A Lot Help from another person to put on and taking off regular upper body clothing?: A Lot Help from another person to put on and taking off regular lower body clothing?: A Lot 6 Click Score: 15    End of Session Equipment Utilized During Treatment: Gait belt;Rolling walker (2 wheels)  OT Visit Diagnosis: Other abnormalities of gait and mobility (R26.89);Muscle weakness (generalized) (M62.81);Pain   Activity Tolerance Patient limited by pain   Patient Left in chair;with chair alarm set;with call bell/phone within reach   Nurse Communication          Time: 1310-1340 OT Time Calculation  (min): 30 min  Charges: OT General Charges $OT Visit: 1 Visit OT Treatments $Self Care/Home Management : 8-22 mins   Rockney Ghee, M.S., OTR/L Feeding Team - [facility] Nurs[facility] Ascom: [phone] 05/28/21, 4:18 PM

## 2021-05-28 NOTE — Progress Notes (Signed)
?[facility] CARDIOLOGY CONSULT NOTE  ? ?    ?Patient ID: ?Kerri Carter ?MRN: [id number] ?DOB/AGE: 79-04-1940 79 y.o. ? ?Admit date: 05/23/2021 ?Referring Physician Dr. Eugenie Norrie ?Primary Physician Dr. Olivia Mackie McLean-Scocuzza ?Primary Cardiologist Laqueta Jean, PA-C ?Reason for Consultation acute CHF ? ?HPI: Kerri Carter is a 81 year old female with a PMH of HFpEF (LVEF 50%, g1DD 05/16/21), low flow/low gradient AS, COPD on 2L of O2 chronically, nonobstructive CAD by heart cath 03/2011, hypertension, hyperlipidemia, PAD, CKD3 with history of R nephrectomy 1988, depression, who presented to [facility] for a lumbar fusion extension on 05/23/21 with Dr. [name]. On POD3 (4/24) she became hypoxic in the early morning with associated shortness of breath, with an increased oxygen requirement from HFNC to BiPAP. Cardiology is consulted for further assistance.  ? ?Interval History: ?-Continues to feel better this morning, weaned from HFNC to 5 L by nasal cannula this morning. ?-Admits to pain in her left lower abdomen that radiates to her left leg controlled with PO pain meds, remains with productive cough, but denies chest pain, palpitations, dizziness. ?-Remains in rate controlled in atrial fibrillation  ? ?Review of systems complete and found to be negative unless listed above  ? ?Past Medical History:  ?Diagnosis Date  ? Anxiety   ? Aortic stenosis 07/09/2015  ? a.) TTE 07/06/2015: EF 55-60%; mild AS with MPG 13.0 mmHg.  ? Asthma   ? Bipolar disorder ([place])   ? C. difficile diarrhea 2010  ? a.) following ABX course during hospital admission  ? Carotid atherosclerosis, bilateral   ? a.) Moderate; < 50% stenosis BILATERAL ICAs.  ? Cataract   ? a.) s/p BILATERAL extraction  ? Chicken pox   ? CKD (chronic kidney disease), stage III ([place])   ? a. s/p R nephrectomy./ aneurysm  ? Conversion disorder   ? COPD (chronic obstructive pulmonary disease) ([place])   ? Depression   ? Essential hypertension   ? GERD (gastroesophageal reflux disease)    ? Heart murmur   ? Hyperlipidemia   ? ILD (interstitial lung disease) ([place])   ? mild; 2/2 RA diagnosis  ? Inflammatory arthritis   ? a. hands/carpal tunnel.  b. Low titer rheumatoid factor. c. Negative anti-CCP antibodies. d. Plaquenil.  ? Macular degeneration   ? Nocturnal hypoxemia   ? Non-Obstructive CAD   ? a. 07/2009 Cath ([facility]): nonobs dzs;  b. 03/2011 Cath [facility]): nonobs dzs.  ? Osteoarthritis   ? a. Knees.  ? PAD (peripheral artery disease) ([place])   ? PUD (peptic ulcer disease)   ? S/P right hip fracture   ? 11/01/16 s/p repair  ? Shoulder pain   ? Sleep apnea   ? no cpap / minimal  ? Spinal stenosis at L4-L5 level   ? severe with L4/L5 anterolisthesis grade 1 anterolisthesis   ? Toxic maculopathy   ? Valvular heart disease   ? a.) TTE 07/06/2015: EF 55-60%; Mild MR/AR/TR; Mild AS with MPG 13.0 mmHg.  ?  ?Past Surgical History:  ?Procedure Laterality Date  ? APPENDECTOMY    ? APPLICATION OF INTRAOPERATIVE CT SCAN N/A 05/23/2021  ? Procedure: APPLICATION OF INTRAOPERATIVE CT SCAN;  Surgeon: Meade Maw, MD;  Location: [facility] ORS;  Service: Neurosurgery;  Laterality: N/A;  ? BACK SURGERY    ? lumbar fusion  ? BUNIONECTOMY Right   ? CATARACT EXTRACTION, BILATERAL    ? CESAREAN SECTION    ? x1  ? CHOLECYSTECTOMY N/A 05/11/2016  ? Procedure: LAPAROSCOPIC CHOLECYSTECTOMY;  Surgeon: Jerrol Banana  Burt Knack, MD;  Location: [facility] ORS;  Service: General;  Laterality: N/A;  ? COLONOSCOPY WITH PROPOFOL N/A 04/02/2016  ? Procedure: COLONOSCOPY WITH PROPOFOL;  Surgeon: Jonathon Bellows, MD;  Location: [facility] ENDOSCOPY;  Service: Endoscopy;  Laterality: N/A;  ? ENDOSCOPIC RETROGRADE CHOLANGIOPANCREATOGRAPHY (ERCP) WITH PROPOFOL N/A 05/08/2016  ? Procedure: ENDOSCOPIC RETROGRADE CHOLANGIOPANCREATOGRAPHY (ERCP) WITH PROPOFOL;  Surgeon: Lucilla Lame, MD;  Location: [facility] ENDOSCOPY;  Service: Endoscopy;  Laterality: N/A;  ? ERCP    ? with biliary spincterotomy 05/08/16 Dr. Allen Norris for choledocholithiasis   ? ESOPHAGEAL DILATION  04/02/2016  ? Procedure:  ESOPHAGEAL DILATION;  Surgeon: Jonathon Bellows, MD;  Location: [facility] ENDOSCOPY;  Service: Endoscopy;;  ? ESOPHAGOGASTRODUODENOSCOPY (EGD) WITH PROPOFOL N/A 04/02/2016  ? Procedure: ESOPHAGOGASTRODUODENOSCOPY (EGD) WITH PROPOFOL;  Surgeon: Jonathon Bellows, MD;  Location: [facility] ENDOSCOPY;  Service: Endoscopy;  Laterality: N/A;  ? HIP ARTHROPLASTY Right 11/01/2016  ? Procedure: ARTHROPLASTY BIPOLAR HIP (HEMIARTHROPLASTY);  Surgeon: Corky Mull, MD;  Location: [facility] ORS;  Service: Orthopedics;  Laterality: Right;  ? LUMBAR LAMINECTOMY/DECOMPRESSION MICRODISCECTOMY N/A 12/11/2020  ? Procedure: LEFT L2-3 MICRODISCECTOMY, L3-4 AND L5-S1 DECOMPRESSION;  Surgeon: Meade Maw, MD;  Location: [facility] ORS;  Service: Neurosurgery;  Laterality: N/A;  ? NEPHRECTOMY  1988  ? 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 ORIF SCAPHOID FRACTURE Left 12/21/2019  ? Procedure: OPEN REDUCTION INTERNAL FIXATION (ORIF) OF LEFT SCAPULAR NONUNION WITH BONE GRAFT;  Surgeon: Corky Mull, MD;  Location: [facility] ORS;  Service: Orthopedics;  Laterality: Left;  ? osteoporosis    ? noted DEXA 08/19/16   ? REPLACEMENT TOTAL KNEE Right   ? REVERSE SHOULDER ARTHROPLASTY Right 11/04/2017  ? Procedure: REVERSE SHOULDER ARTHROPLASTY;  Surgeon: Corky Mull, MD;  Location: [facility] ORS;  Service: Orthopedics;  Laterality: Right;  ? REVERSE SHOULDER ARTHROPLASTY Left 07/26/2018  ? Procedure: REVERSE SHOULDER ARTHROPLASTY;  Surgeon: Corky Mull, MD;  Location: [facility] ORS;  Service: Orthopedics;  Laterality: Left;  ? TONSILLECTOMY    ? TOTAL HIP ARTHROPLASTY  12/10/11  ? [place] left hip  ? TOTAL HIP ARTHROPLASTY Bilateral   ? TRANSFORAMINAL LUMBAR INTERBODY FUSION (TLIF) WITH PEDICLE SCREW FIXATION 3 LEVEL N/A 05/23/2021  ? Procedure: OPEN L2-4 TRANSFORAMINAL LUMBAR INTERBODY FUSION (TLIF) WITH L2-5 POSTERIOR SPINAL FUSION;  Surgeon: Meade Maw, MD;  Location: [facility] ORS;  Service: Neurosurgery;  Laterality: N/A;  ? TUBAL LIGATION    ?  ?Medications  Prior to Admission  ?Medication Sig Dispense Refill Last Dose  ? Adalimumab 40 MG/0.4ML PNKT Inject 40 mg into the skin every 14 (fourteen) days.   Past Month  ? albuterol (VENTOLIN HFA) 108 (90 Base) MCG/ACT inhaler Inhale 2 puffs into the lungs every 6 (six) hours as needed for wheezing or shortness of breath. 18 g 11 05/23/2021  ? amLODipine (NORVASC) 2.5 MG tablet Take 1 tablet (2.5 mg total) by mouth daily. (Patient taking differently: Take 5 mg by mouth 2 (two) times daily.) 90 tablet 3 05/23/2021  ? amoxicillin-clavulanate (AUGMENTIN) 875-125 MG tablet Take 1 tablet by mouth 2 (two) times daily. With food x 7-10 days 20 tablet 0 05/22/2021  ? budesonide (PULMICORT) 0.25 MG/2ML nebulizer solution USE 1 VIAL  IN  NEBULIZER TWICE  DAILY - rinse mouth after treatment 60 mL 11 05/23/2021  ? Cholecalciferol (VITAMIN D3) 50 MCG (2000 UT) TABS Take 4,000 Units by mouth daily.   05/22/2021  ? clopidogrel (PLAVIX) 75 MG tablet Take 75 mg by mouth daily.   05/16/2021  ? cyclobenzaprine (  FLEXERIL) 10 MG tablet Take 1 tablet (10 mg total) by mouth 3 (three) times daily as needed for muscle spasms. 30 tablet 0 05/22/2021  ? dicyclomine (BENTYL) 10 MG capsule Take 1 capsule (10 mg total) by mouth 2 (two) times daily as needed for spasms. (Patient taking differently: Take 20 mg by mouth 2 (two) times daily.) 180 capsule 3 05/23/2021  ? erythromycin ophthalmic ointment Place 1 application. into both eyes 3 (three) times daily. X4-7 days 3.5 g 0 05/22/2021  ? escitalopram (LEXAPRO) 10 MG tablet TAKE 1 TABLET BY MOUTH EVERY DAY 90 tablet 1 05/22/2021  ? famotidine (PEPCID) 20 MG tablet TAKE 1 TABLET (20 MG TOTAL) BY MOUTH DAILY. BEFORE BREAKFAST OR DINNER 90 tablet 1 05/23/2021  ? fluconazole (DIFLUCAN) 150 MG tablet TAKE 1 TABLET (150 MG TOTAL) BY MOUTH ONCE FOR 1 DOSE. REPEAT 2ND DOSE IN 3-7 DAYS AS NEEDED 3 tablet 0 05/22/2021  ? fluticasone (FLONASE) 50 MCG/ACT nasal spray Place 2 sprays into both nostrils daily. In am   05/22/2021  ?  gabapentin (NEURONTIN) 300 MG capsule Take 2 capsules (600 mg total) by mouth in the morning and at bedtime. 360 capsule 3 05/23/2021  ? ipratropium-albuterol (DUONEB) 0.5-2.5 (3) MG/3ML SOLN TAKE 3 MLS

## 2021-05-28 NOTE — Progress Notes (Signed)
Inpatient Diabetes Program Recommendations ? ?AACE/ADA: New Consensus Statement on Inpatient Glycemic Control ? ?Target Ranges:  Prepandial:   less than 140 mg/dL ?     Peak postprandial:   less than 180 mg/dL (1-2 hours) ?     Critically ill patients:  140 - 180 mg/dL  ? ? Latest Reference Range & Units 05/28/21 00:01 05/28/21 04:02 05/28/21 07:24  ?Glucose-Capillary 70 - 99 mg/dL 149 (H) 183 (H) 164 (H)  ?(H): Data is abnormally high ?Review of Glycemic Control ? ?Diabetes history: No ?Outpatient Diabetes medications: NA ?Current orders for Inpatient glycemic control: None: Solumedrol 20 mg Q12H ? ?Inpatient Diabetes Program Recommendations:   ? ?Insulin: If steroids are continued, may want to consider ordering CBGs AC&HS and Novolog 0-9 units TID with meals and Novolog 0-5 units QHS. ? ?Thanks, ?Barnie Alderman, RN, MSN, CDE ?Diabetes Coordinator ?Inpatient Diabetes Program ?[phone] (Team Pager from 8am to 5pm) ? ? ? ?

## 2021-05-28 NOTE — Progress Notes (Signed)
? ? ? ?Progress Note  ? ? ?Kerri Carter  RUE:[id number] DOB: 06-26-1940  DOA: 05/23/2021 ?PCP: McLean-Scocuzza, Nino Glow, MD  ? ? ? ? ?Brief Narrative:  ? ? ?Medical records reviewed and are as summarized below: ? ?Kerri Carter is a 81 y.o. female with medical history significant for COPD, asthma, bipolar disorder, depression, hypertension, stage III chronic kidney disease,  dyslipidemia, GERD, PAD, peptic ulcer disease, s/p lumbar fusion, new disc herniation at L3-L4 with severe stenosis.  She presented to the hospital for transforaminal lumbar interbody fusion L3/4 and 2/3 and posterolateral arthrodesis L2-L5 on 05/23/2021. ? ?She developed acute hypoxic respiratory failure from pneumonia and acute exacerbation of chronic diastolic CHF.  She required BiPAP and oxygen via high flow nasal cannula for hypoxia.  She was treated with empiric IV antibiotics and IV Lasix.  She developed paroxysmal atrial fibrillation and cardiologist was consulted to assist with management.  She developed AKI likely from IV diuresis. ? ? ?As per NP Meda Coffee (ICU): ?4/21: Underwent elective lumbar fusion with Dr. [name] ?4/22: Declining DVT prophylaxis (Lovenox). ?4/24: Acute Hypoxia and Respiratory Distress requiring BiPAP.  Hospitalist and PCCM consulted.  Aggressive diuresis per Cardiology, empirically covering for pneumonia.  Rule out DVT/PE. ?4/24: NM Pulmonary Perfusion pulmonary embolism absent ?4/24: Echo revealed EF 50 to 55%, left grade II diastolic dysfunction (pseudonormalization) ?4/25: Pt stable on 15L HFNC, PCCM will sign off  ? ? ? ?Assessment/Plan:  ? ?Principal Problem: ?  Acute respiratory failure with hypoxia ([place]) ?Active Problems: ?  Left lower lobe pneumonia ?  Acute on chronic combined systolic and diastolic CHF (congestive heart failure) ([place]) ?  S/P lumbar fusion ?  Acute kidney injury superimposed on chronic kidney disease ([place]) ?  Chronic obstructive pulmonary disease (COPD) ([place]) ?  Peripheral neuropathy ?   Elevated d-dimer ?  Hyperkalemia ?  Anxiety and depression ? ? ?Body mass index is 33.82 kg/m?.  (Obesity) ? ?Acute on chronic hypoxic respiratory failure: She is on 5 L/min oxygen via nasal cannula.  She uses 2 L/min oxygen at night for OSA.  Taper down oxygen as able. ? ?Left lower lobe pneumonia, suspected aspiration: Continue empiric IV antibiotics.  Leukocytosis is improving ? ?Acute on chronic diastolic CHF, moderate to severe aortic stenosis, severe mitral regurgitation: Hold IV Lasix ? ?AKI on CKD stage IIIa: IV Lasix has been held.  Monitor BMP closely. ? ?Paroxysmal atrial fibrillation, new onset: Continue nebivolol.  Transfer score is 4.  Cardiologist wants to wait until 14 days.  Before considering long-term anticoagulation. ? ?COPD exacerbation: Continue IV steroids and bronchodilators. ? ?Type II DM with hyperglycemia: A1c was 6.16 months ago.  Use NovoLog as needed for hyperglycemia. ? ?Generalized weakness, s/p transforaminal lumbar interbody fusion L3/4 and 2/3, posterolateral arthrodesis L2-L5 on 05/23/2021: Analgesics as needed for pain.  Continue PT and OT.  Discharge to SNF recommended. ? ?Other comorbidities include anxiety, depression, anemia of chronic disease, OSA on home oxygen at night ? ?Diet Order   ? ?       ?  Diet Heart Room service appropriate? Yes; Fluid consistency: Thin  Diet effective now       ?  ? ?  ?  ? ?  ? ? ? ? ? ? ? ? ? ? ? ?Consultants: ?Intensivist ?Cardiologist ? ?Procedures: ?Back surgery on 05/23/2021 ? ? ? ?Medications:  ? ? acetaminophen  1,000 mg Oral Q6H  ? amLODipine  5 mg Oral Daily  ? budesonide  0.25  mg Nebulization BID  ? Chlorhexidine Gluconate Cloth  6 each Topical Daily  ? Chlorhexidine Gluconate Cloth  6 each Topical Q0600  ? cholecalciferol  4,000 Units Oral Daily  ? dicyclomine  20 mg Oral BID  ? docusate sodium  100 mg Oral BID  ? enoxaparin (LOVENOX) injection  30 mg Subcutaneous Q24H  ? escitalopram  10 mg Oral Daily  ? famotidine  20 mg Oral Daily  ?  fluticasone  2 spray Each Nare Daily  ? gabapentin  600 mg Oral BID  ? guaiFENesin  600 mg Oral BID  ? insulin aspart  0-5 Units Subcutaneous QHS  ? insulin aspart  0-9 Units Subcutaneous TID WC  ? ipratropium-albuterol  3 mL Nebulization BID  ? lamoTRIgine  100 mg Oral BID  ? leflunomide  20 mg Oral Daily  ? lidocaine  1 patch Transdermal Daily  ? methocarbamol  500 mg Oral Q6H  ? methylPREDNISolone (SOLU-MEDROL) injection  20 mg Intravenous Q12H  ? montelukast  10 mg Oral Daily  ? multivitamin-lutein  1 capsule Oral Daily  ? nebivolol  5 mg Oral Daily  ? QUEtiapine  25 mg Oral QHS  ? senna  1 tablet Oral BID  ? sodium chloride flush  3 mL Intravenous Q12H  ? sucralfate  1 g Oral BID  ? ?Continuous Infusions: ? sodium chloride    ? ampicillin-sulbactam (UNASYN) IV 200 mL/hr at 05/28/21 0749  ? azithromycin 500 mg (05/28/21 1009)  ? methocarbamol (ROBAXIN) IV Stopped (05/28/21 0237)  ? ? ? ?Anti-infectives (From admission, onward)  ? ? Start     Dose/Rate Route Frequency Ordered Stop  ? 05/27/21 1000  azithromycin (ZITHROMAX) 500 mg in sodium chloride 0.9 % 250 mL IVPB       ? 500 mg ?250 mL/hr over 60 Minutes Intravenous Daily 05/26/21 1648 05/31/21 0959  ? 05/26/21 2000  Ampicillin-Sulbactam (UNASYN) 3 g in sodium chloride 0.9 % 100 mL IVPB       ? 3 g ?200 mL/hr over 30 Minutes Intravenous Every 12 hours 05/26/21 1656    ? 05/26/21 1015  azithromycin (ZITHROMAX) tablet 500 mg  Status:  Discontinued       ? 500 mg Oral Daily 05/26/21 0921 05/26/21 1443  ? 05/26/21 0645  levofloxacin (LEVAQUIN) IVPB 750 mg  Status:  Discontinued       ? 750 mg ?100 mL/hr over 90 Minutes Intravenous Every 24 hours 05/26/21 0546 05/26/21 0556  ? 05/26/21 0645  azithromycin (ZITHROMAX) 500 mg in sodium chloride 0.9 % 250 mL IVPB  Status:  Discontinued       ? 500 mg ?250 mL/hr over 60 Minutes Intravenous Every 24 hours 05/26/21 0547 05/26/21 0919  ? 05/26/21 0645  Ampicillin-Sulbactam (UNASYN) 3 g in sodium chloride 0.9 % 100 mL IVPB   Status:  Discontinued       ? 3 g ?200 mL/hr over 30 Minutes Intravenous Every 12 hours 05/26/21 0557 05/26/21 1443  ? 05/23/21 1044  vancomycin (VANCOCIN) powder  Status:  Discontinued       ?   As needed 05/23/21 1044 05/23/21 1137  ? 05/23/21 0654  ceFAZolin (ANCEF) IVPB 2g/100 mL premix       ? 2 g ?200 mL/hr over 30 Minutes Intravenous 60 min pre-op 05/23/21 0655 05/23/21 0745  ? 05/23/21 0652  vancomycin (VANCOCIN) 1-5 GM/200ML-% IVPB       ?Note to Pharmacy: Trudie Reed S: cabinet override  ?    05/23/21  5726 05/23/21 0855  ? 05/23/21 0652  ceFAZolin (ANCEF) 2-4 GM/100ML-% IVPB       ?Note to Pharmacy: Trudie Reed S: cabinet override  ?    05/23/21 0652 05/23/21 0855  ? 05/23/21 0623  vancomycin (VANCOCIN) IVPB 1000 mg/200 mL premix       ? 1,000 mg ?200 mL/hr over 60 Minutes Intravenous 60 min pre-op 05/23/21 2035 05/23/21 0900  ? ?  ? ? ? ? ? ? ? ? ? ?Family Communication/Anticipated D/C date and plan/Code Status  ? ?DVT prophylaxis: enoxaparin (LOVENOX) injection 30 mg Start: 05/27/21 0800 ?Place and maintain sequential compression device Start: 05/27/21 0428 ?SCD's Start: 05/23/21 1214 ? ? ?  Code Status: DNR ? ?Family Communication: None ?Disposition Plan: Plan to discharge to SNF in 2 to 3 days ? ? ?Status is: Inpatient ?Remains inpatient appropriate because: IV antibiotics, hypoxia  ? ? ? ? ? ? ?Subjective:  ? ?Interval events noted.  She complains of generalized weakness. ? ?Objective:  ? ? ?Vitals:  ? 05/28/21 0913 05/28/21 1100 05/28/21 1200 05/28/21 1207  ?BP: 135/64 98/86 109/64   ?Pulse: 76 82 80   ?Resp: 15 (!) 25 16   ?Temp:      ?TempSrc:      ?SpO2: 93% 92% 92% 92%  ?Weight:      ?Height:      ? ?No data found. ? ? ?Intake/Output Summary (Last 24 hours) at 05/28/2021 1241 ?Last data filed at 05/28/2021 0803 ?Gross per 24 hour  ?Intake 1005.17 ml  ?Output 950 ml  ?Net 55.17 ml  ? ?Filed Weights  ? 05/26/21 1700 05/27/21 0446 05/28/21 0422  ?Weight: 81.2 kg 81.2 kg 81.2 kg  ? ? ?Exam: ? ?GEN:  NAD ?SKIN: No rash ?EYES: EOMI ?ENT: MMM ?CV: RRR ?PULM: Decreased air entry bilaterally, bilateral expiratory wheezing ?ABD: soft, ND, NT, +BS ?CNS: AAO x 3, non focal ?EXT: No edema or tenderness ? ? ? ?

## 2021-05-28 NOTE — Progress Notes (Signed)
Went into pts room to give 12:00 meds and could hear pts rhonci standing at bedside as well as with stethescope, I called RT to come give prn neb. ?

## 2021-05-28 NOTE — Progress Notes (Signed)
Physical Therapy Treatment ?Patient Details ?Name: Kerri Carter ?MRN: [id number] ?DOB: [birth year] ?Today's Date: 05/28/2021 ? ? ?History of Present Illness Pt is a 81 y/o F admitted to hospital who presents with spondylithesis and lumbar radiculopathy and has failed conservative management. Pt is now s/p L 3/4 & 2/3 fusion with posterolateral arthrodesis from L2 to L5.  Pt noted with acute hypoxia with dyspnea 4/24 and transferred to ICU (pt with acute respiratory failure with hypoxia, L LL PNA, acute on chronic combined systolic and diastolic CHF, AKI superimposed on CKD, and s/p lumbar fusion). PMH: anxiety, aortic stenosis, asthma, CKD, CVA, depression, GERD/PUD, retention, interstitial lung disease, PAD, bipolar disorder, conversion disorder, heart murmur, HLD. ? ?  ?PT Comments  ? ? PT/OT co-treat 2/2 to pt's limited activity tolerance and need for +2 assistance at times. Pt was A and O x 4 and continues to maintain sao2 > 88% while being lowered to 3L o2.Pt is severely limited by anxiety. She performed log roll R to short sit with mod assist. Required +2 assistance to apply TLSO due to pt's poor sitting balance. Several times pt loses balance posteriorly. Session did progress to standing and then taking steps at EOB. She used RW + min assist. Overall pt is progressing but not at baseline. Recommend DC to SNF to address deficits while maximizing independence. Acute PT will continue to follow per current POC.  ?    ?Recommendations for follow up therapy are one component of a multi-disciplinary discharge planning process, led by the attending physician.  Recommendations may be updated based on patient status, additional functional criteria and insurance authorization. ? ?Follow Up Recommendations ? Skilled nursing-short term rehab (<3 hours/day) ?  ?  ?Assistance Recommended at Discharge Frequent or constant Supervision/Assistance  ?Patient can return home with the following A lot of help with walking and/or  transfers;A lot of help with bathing/dressing/bathroom;Direct supervision/assist for financial management;Assistance with cooking/housework;Help with stairs or ramp for entrance;Assist for transportation ?  ?Equipment Recommendations ? Other (comment) (defer to next level of care)  ?  ?   ?Precautions / Restrictions Precautions ?Precautions: Fall;Back ?Precaution Booklet Issued: No ?Required Braces or Orthoses: Spinal Brace ?Spinal Brace: Thoracolumbosacral orthotic;Applied in sitting position ?Restrictions ?Weight Bearing Restrictions: No  ?  ? ?Mobility ? Bed Mobility ?Overal bed mobility: Needs Assistance ?Bed Mobility: Rolling, Sidelying to Sit, Sit to Sidelying ?Rolling: Min assist ?Sidelying to sit: Mod assist ?  ?  ?  ?General bed mobility comments: pt continues to require physical; assistance and vcs for proper technique. poor sitting balance at EOB ?  ? ?Transfers ?Overall transfer level: Needs assistance ?Equipment used: Rolling walker (2 wheels) ?Transfers: Sit to/from Stand ?Sit to Stand: Min assist, From elevated surface ?  ?  ?  ?  ?  ?General transfer comment: Pt was able to stand several times at EOB to RW with vcs for handplacement and +2 for safety. ?  ? ?Ambulation/Gait ?Ambulation/Gait assistance: Min assist ?Gait Distance (Feet): 3 Feet ?Assistive device: Rolling walker (2 wheels) ?Gait Pattern/deviations: Step-to pattern ?Gait velocity: decreased ?  ?  ?General Gait Details: pt was able to take a few steps from FOB to [facility] with vcs. She then stood and took a few steps to recliner from EOB. ? ?  ?Balance Overall balance assessment: Needs assistance ?Sitting-balance support: Feet supported, Bilateral upper extremity supported ?Sitting balance-Leahy Scale: Poor ?Sitting balance - Comments: pt has several occasions of LOB posteriorly while sitting EOB. +2 assistance for safety when applying  TLSO. Author adjusted TLSO to proper fit. ?  ?Standing balance support: Reliant on assistive device for  balance, Bilateral upper extremity supported, During functional activity ?Standing balance-Leahy Scale: Poor ?Standing balance comment: reliant on BUE support with all standing/dynamic RW ?  ?  ?  ?Cognition Arousal/Alertness: Awake/alert ?Behavior During Therapy: Anxious ?Overall Cognitive Status: Within Functional Limits for tasks assessed ?  ?   ?General Comments: Pt is A and O but does present with severe anxiety. per pt," I use to take xanex everyday but I recently come off of it." ?  ?  ? ?  ?   ?   ? ?Pertinent Vitals/Pain Pain Assessment ?Pain Assessment: PAINAD ?Breathing: occasional labored breathing, short period of hyperventilation ?Negative Vocalization: occasional moan/groan, low speech, negative/disapproving quality ?Facial Expression: sad, frightened, frown ?Body Language: tense, distressed pacing, fidgeting ?Consolability: distracted or reassured by voice/touch ?PAINAD Score: 5 ?Pain Location: low back ?Pain Descriptors / Indicators: Discomfort, Grimacing, Guarding ?Pain Intervention(s): Limited activity within patient's tolerance, Monitored during session, Premedicated before session, Repositioned, Ice applied  ? ? ? ?PT Goals (current goals can now be found in the care plan section) Acute Rehab PT Goals ?Patient Stated Goal: improve pain and mobility ?Progress towards PT goals: Progressing toward goals ? ?  ?Frequency ? ? ? 7X/week ? ? ? ?  ?PT Plan Current plan remains appropriate  ? ? ?Co-evaluation PT/OT/SLP Co-Evaluation/Treatment: Yes ?Reason for Co-Treatment: Complexity of the patient's impairments (multi-system involvement);For patient/therapist safety;To address functional/ADL transfers ?PT goals addressed during session: Mobility/safety with mobility;Balance;Proper use of DME;Strengthening/ROM ?  ?  ? ?  ?AM-PAC PT "6 Clicks" Mobility   ?Outcome Measure ? Help needed turning from your back to your side while in a flat bed without using bedrails?: A Lot ?Help needed moving from lying on  your back to sitting on the side of a flat bed without using bedrails?: A Lot ?Help needed moving to and from a bed to a chair (including a wheelchair)?: A Lot ?Help needed standing up from a chair using your arms (e.g., wheelchair or bedside chair)?: A Lot ?Help needed to walk in hospital room?: A Lot ?Help needed climbing 3-5 steps with a railing? : Total ?6 Click Score: 11 ? ?  ?End of Session Equipment Utilized During Treatment: Oxygen (was able to wean to 3 L o2 with sao2 >89%.) ?Activity Tolerance: Patient tolerated treatment well ?Patient left: in chair;with call bell/phone within reach;with chair alarm set ?Nurse Communication: Mobility status;Precautions ?PT Visit Diagnosis: Unsteadiness on feet (R26.81);Pain;Muscle weakness (generalized) (M62.81);Difficulty in walking, not elsewhere classified (R26.2);History of falling (Z91.81);Repeated falls (R29.6) ?  ? ? ?Time: 5189-8421 ?PT Time Calculation (min) (ACUTE ONLY): 30 min ? ?Charges:  $Therapeutic Activity: 8-22 mins          ?          ? ?Julaine Fusi PTA ?05/28/21, 2:32 PM  ? ?

## 2021-05-28 NOTE — Progress Notes (Signed)
? ?   Attending Progress Note ? ?History: Kerri Carter is here for adjacent segment segment disease with spondylolisthesis in the lumbar spine as well as lumbar radiculopathy. ? ?POD5: Doing well from a spine standpoint this afternoon. Reports improved leg and back pain ? ?POD4: transferred to ICU yesterday. ? ?POD3: suffered from hypoxia overnight was increased O2 requirement. Hosptialist consulted for assistance.  ? ?POD2: Doing well with continued back pain.   ? ?POD1: Expected post-op back pain.  Her legs feel better. ? ?Physical Exam: ?Vitals:  ? 05/28/21 1200 05/28/21 1207  ?BP: 109/64   ?Pulse: 80   ?Resp: 16   ?Temp:    ?SpO2: 92% 92%  ? ? ?AA Ox2 ?CNI ? ?Strength:5/5 throughout BLE ? ?Data: ? ?Recent Labs  ?Lab 05/26/21 ?0442 05/27/21 ?1017 05/28/21 ?0501  ?NA 135 135 136  ?K 5.6* 4.3 4.8  ?CL 102 97* 100  ?CO2 '27 30 26  '$ ?BUN 34* 43* 51*  ?CREATININE 1.65* 1.56* 1.54*  ?GLUCOSE 142* 197* 174*  ?CALCIUM 8.1* 7.9* 8.0*  ? ?No results for input(s): AST, ALT, ALKPHOS in the last 168 hours. ? ?Invalid input(s): TBILI  ? Recent Labs  ?Lab 05/26/21 ?0442 05/27/21 ?1017 05/28/21 ?0501  ?WBC 25.1* 17.3* 14.4*  ?HGB 9.7* 8.8* 9.3*  ?HCT 32.0* 27.7* 28.6*  ?PLT 250 260 251  ? ?No results for input(s): APTT, INR in the last 168 hours.  ?   ? ? ?Other tests/results: n/a ? ?Assessment/Plan: ? ?Kerri Carter is doing well after extension of lumbar fusion to L2. ? ?- mobilize ?- pain control ?- pt cleared for DVT ppx from neurosurgical standpoint  ?- PTOT ?- appreciate medicines assistance with medical management.  ? ?Cooper Render  ?Department of Neurosurgery ? ? ? ?

## 2021-05-28 NOTE — Plan of Care (Signed)

## 2021-05-29 ENCOUNTER — Inpatient Hospital Stay: Payer: Medicare PPO

## 2021-05-29 DIAGNOSIS — I5043 Acute on chronic combined systolic (congestive) and diastolic (congestive) heart failure: Secondary | ICD-10-CM | POA: Diagnosis not present

## 2021-05-29 DIAGNOSIS — J441 Chronic obstructive pulmonary disease with (acute) exacerbation: Secondary | ICD-10-CM | POA: Diagnosis not present

## 2021-05-29 DIAGNOSIS — J9601 Acute respiratory failure with hypoxia: Secondary | ICD-10-CM | POA: Diagnosis not present

## 2021-05-29 LAB — BASIC METABOLIC PANEL
Anion gap: 9 (ref 5–15)
BUN: 52 mg/dL — ABNORMAL HIGH (ref 8–23)
CO2: 29 mmol/L (ref 22–32)
Calcium: 8 mg/dL — ABNORMAL LOW (ref 8.9–10.3)
Chloride: 100 mmol/L (ref 98–111)
Creatinine, Ser: 1.35 mg/dL — ABNORMAL HIGH (ref 0.44–1.00)
GFR, Estimated: 40 mL/min — ABNORMAL LOW (ref >60)
Glucose, Bld: 176 mg/dL — ABNORMAL HIGH (ref 70–99)
Potassium: 4.1 mmol/L (ref 3.5–5.1)
Sodium: 138 mmol/L (ref 135–145)

## 2021-05-29 LAB — CBC WITH DIFFERENTIAL/PLATELET
Abs Immature Granulocytes: 0.36 10*3/uL — ABNORMAL HIGH (ref 0.00–0.07)
Basophils Absolute: 0 10*3/uL (ref 0.0–0.1)
Basophils Relative: 0 %
Eosinophils Absolute: 0 10*3/uL (ref 0.0–0.5)
Eosinophils Relative: 0 %
HCT: 28.3 % — ABNORMAL LOW (ref 36.0–46.0)
Hemoglobin: 8.9 g/dL — ABNORMAL LOW (ref 12.0–15.0)
Immature Granulocytes: 3 %
Lymphocytes Relative: 10 %
Lymphs Abs: 1.3 10*3/uL (ref 0.7–4.0)
MCH: 26.9 pg (ref 26.0–34.0)
MCHC: 31.4 g/dL (ref 30.0–36.0)
MCV: 85.5 fL (ref 80.0–100.0)
Monocytes Absolute: 0.7 10*3/uL (ref 0.1–1.0)
Monocytes Relative: 5 %
Neutro Abs: 11.2 10*3/uL — ABNORMAL HIGH (ref 1.7–7.7)
Neutrophils Relative %: 82 %
Platelets: 277 10*3/uL (ref 150–400)
RBC: 3.31 MIL/uL — ABNORMAL LOW (ref 3.87–5.11)
RDW: 14.6 % (ref 11.5–15.5)
WBC: 13.7 10*3/uL — ABNORMAL HIGH (ref 4.0–10.5)
nRBC: 0.9 % — ABNORMAL HIGH (ref 0.0–0.2)

## 2021-05-29 LAB — GLUCOSE, CAPILLARY
Glucose-Capillary: 170 mg/dL — ABNORMAL HIGH (ref 70–99)
Glucose-Capillary: 228 mg/dL — ABNORMAL HIGH (ref 70–99)
Glucose-Capillary: 186 mg/dL — ABNORMAL HIGH (ref 70–99)
Glucose-Capillary: 189 mg/dL — ABNORMAL HIGH (ref 70–99)

## 2021-05-29 MED ORDER — BUSPIRONE HCL 5 MG PO TABS
5.0000 mg | ORAL_TABLET | Freq: Two times a day (BID) | ORAL | Status: DC
Start: 2021-05-29 — End: 2021-06-02
  Administered 2021-05-29 – 2021-06-02 (×9): 5 mg via ORAL
  Filled 2021-05-29 (×7): qty 1
  Filled 2021-05-29 (×2): qty 0.5
  Filled 2021-05-29: qty 1

## 2021-05-29 MED ORDER — FUROSEMIDE 10 MG/ML IJ SOLN
40.0000 mg | Freq: Every day | INTRAMUSCULAR | Status: DC
  Administered 2021-05-29 – 2021-05-31 (×3): 40 mg via INTRAVENOUS
  Filled 2021-05-29 (×3): qty 4

## 2021-05-29 MED ORDER — AMOXICILLIN-POT CLAVULANATE 875-125 MG PO TABS
1.0000 | ORAL_TABLET | Freq: Two times a day (BID) | ORAL | Status: AC
  Administered 2021-05-29 – 2021-05-31 (×4): 1 via ORAL
  Filled 2021-05-29 (×4): qty 1

## 2021-05-29 MED ORDER — AMOXICILLIN-POT CLAVULANATE 875-125 MG PO TABS: 1.0000 | ORAL_TABLET | Freq: Two times a day (BID) | ORAL | Status: DC

## 2021-05-29 MED ORDER — ENOXAPARIN SODIUM 40 MG/0.4ML IJ SOSY
40.0000 mg | PREFILLED_SYRINGE | INTRAMUSCULAR | Status: DC
Start: 2021-05-30 — End: 2021-06-02
  Administered 2021-05-30 – 2021-06-02 (×4): 40 mg via SUBCUTANEOUS
  Filled 2021-05-29 (×4): qty 0.4

## 2021-05-29 MED ORDER — AZITHROMYCIN 500 MG PO TABS
500.0000 mg | ORAL_TABLET | Freq: Every day | ORAL | Status: AC
  Administered 2021-05-29 – 2021-05-30 (×2): 500 mg via ORAL
  Filled 2021-05-29 (×2): qty 1

## 2021-05-29 NOTE — Progress Notes (Signed)
Speech Language Pathology Treatment:    ?Patient Details ?Name: Kerri Carter ?MRN: [id number] ?DOB: 14-Oct-1940 ?Today's Date: 05/29/2021 ?Time: 6962-9528 ?SLP Time Calculation (min) (ACUTE ONLY): 10 min ? ?Assessment / Plan / Recommendation ?Clinical Impression ? Pt seen for diet tolerance. Pt alert and cooperative. Consuming breakfast from regular consistency with thin liquid meal tray upon SLP entrance to room. Audible, rattling respirations noted. On 10L/min O2 via HFNC. Pt in semi-reclined position per pt request.  ? ?Pt observed with a few bites/sips from meal tray including diced potatoes and OJ via straw. Oral phase was functional across textures. Pharyngeal swallow also appeared [facility]. Pt denies difficulty swallowing and RN stated pt without reported or observed difficulty. Pt declined further PO trials as RN tending to pt and administered medication. ? ?Per chart review, CXR pending. Most recent CXR, 4/26, "1. Focal left perihilar airspace disease concerning for pneumonia. Bilateral interstitial thickening likely reflecting mild underlying pulmonary vascular congestion." WBC trending downward. WBC WNL.  ? ?Recommend continuation of regular consistency diet with thin liquids and safe swallowing strategies/aspiration precautions as outlined below.  ? ?Given limited nature of today's diet tolerance, continued supplemental O2 requirement, and pending CXR. Will f/u x1 for additional diet tolerance.  ? ?Pt and RN made aware of results, recommendations, and SLP POC. Also reviewed, relationship between breathing swallowing, importance of rest breaks while eating/drinking for energy conservation in setting of respiratory failure, and s/sx pharyngeal dysphagia to be mindful of.  Pt verbalized understanding. ?  ?HPI HPI: Patient is a 81 y.o. female with PMH including heart failure, COPD on 2L home O2 at night, CAD, HTN, HLD, PAD, CKD3, R nephrectomy, admitted 05/23/21 for elective lumbar fusion extension. Postoperatively  she developed acute on chronic hypoxic respiratory failure in setting of acute decompensated HFpEF, questionable pneumonia, questionable PE and AECOPD requiring BiPAP.  4/24 Admitted to ICU, Pulmonary perfusion: "pulmonary embolism absent." Now stable on 15L HFNC. Per chart review, pt has remote history of esophageal dilation, most recent EGD in 2018 showed "normal esophagus." CXR 05/27/21: "diffuse bilateral interstitial opacity, more focal and masslike in the left  midlung, and layering bilateral pleural effusions." ?  ?   ?SLP Plan ? Continue with current plan of care ? ?  ?  ?Recommendations for follow up therapy are one component of a multi-disciplinary discharge planning process, led by the attending physician.  Recommendations may be updated based on patient status, additional functional criteria and insurance authorization. ?  ? ?Recommendations  ?Diet recommendations: Regular;Thin liquid ?Medication Administration: Whole meds with liquid (as tolerated) ?Supervision: Patient able to self feed ?Compensations: Slow rate;Small sips/bites;Other (Comment) (rest breaks PRN) ?Postural Changes and/or Swallow Maneuvers: Out of bed for meals;Seated upright 90 degrees;Upright 30-60 min after meal  ?   ?    ?   ? ? ? ? Oral Care Recommendations: Oral care BID ?Follow Up Recommendations:  (TBD) ?Assistance recommended at discharge: Intermittent Supervision/Assistance ?SLP Visit Diagnosis: Dysphagia, unspecified (R13.10) ?Plan: Continue with current plan of care ? ? ? ? ?  ?  ? ?Cherrie Gauze, M.S., CCC-SLP ?Speech-Language Pathologist ?[facility] ?[phone]) ? ? ?Quintella Baton ? ?05/29/2021, 12:07 PM ?

## 2021-05-29 NOTE — Progress Notes (Signed)
?[facility] CONSULT NOTE  ? ?    ?Patient ID: ?Kerri Carter ?MRN: [id number] ?DOB/AGE: July 30, 1940 81 y.o. ? ?Admit date: 05/23/2021 ?Referring Physician Dr. Eugenie Norrie ?Primary Physician Dr. Olivia Mackie McLean-Scocuzza ?Primary Cardiologist Laqueta Jean, PA-C ?Reason for Consultation acute CHF ? ?HPI: Kerri Carter is a 81 year old female with a PMH of HFpEF (LVEF 50%, g1DD 05/16/21), low flow/low gradient AS, COPD on 2L of O2 chronically, nonobstructive CAD by heart cath 03/2011, hypertension, hyperlipidemia, PAD, CKD3 with history of R nephrectomy 1988, depression, who presented to [facility] for a lumbar fusion extension on 05/23/21 with Dr. [name]. On POD3 (4/24) she became hypoxic in the early morning with associated shortness of breath, with an increased oxygen requirement from HFNC to BiPAP. Cardiology is consulted for further assistance.  ? ?Interval History: ?-requiring 15L HFNC overnight, weaned to 10L during interview (was on 4L by [place] yesterday afternoon) ?-not feeling as good as yesterday, cough with blood tinged sputum ?-no chest pain, palpitations ? ?Review of systems complete and found to be negative unless listed above  ? ?Past Medical History:  ?Diagnosis Date  ? Anxiety   ? Aortic stenosis 07/09/2015  ? a.) TTE 07/06/2015: EF 55-60%; mild AS with MPG 13.0 mmHg.  ? Asthma   ? Bipolar disorder ([place])   ? C. difficile diarrhea 2010  ? a.) following ABX course during hospital admission  ? Carotid atherosclerosis, bilateral   ? a.) Moderate; < 50% stenosis BILATERAL ICAs.  ? Cataract   ? a.) s/p BILATERAL extraction  ? Chicken pox   ? CKD (chronic kidney disease), stage III ([place])   ? a. s/p R nephrectomy./ aneurysm  ? Conversion disorder   ? COPD (chronic obstructive pulmonary disease) ([place])   ? Depression   ? Essential hypertension   ? GERD (gastroesophageal reflux disease)   ? Heart murmur   ? Hyperlipidemia   ? ILD (interstitial lung disease) ([place])   ? mild; 2/2 RA diagnosis  ? Inflammatory arthritis    ? a. hands/carpal tunnel.  b. Low titer rheumatoid factor. c. Negative anti-CCP antibodies. d. Plaquenil.  ? Macular degeneration   ? Nocturnal hypoxemia   ? Non-Obstructive CAD   ? a. 07/2009 Cath ([facility]): nonobs dzs;  b. 03/2011 Cath [facility]): nonobs dzs.  ? Osteoarthritis   ? a. Knees.  ? PAD (peripheral artery disease) ([place])   ? PUD (peptic ulcer disease)   ? S/P right hip fracture   ? 11/01/16 s/p repair  ? Shoulder pain   ? Sleep apnea   ? no cpap / minimal  ? Spinal stenosis at L4-L5 level   ? severe with L4/L5 anterolisthesis grade 1 anterolisthesis   ? Toxic maculopathy   ? Valvular heart disease   ? a.) TTE 07/06/2015: EF 55-60%; Mild MR/AR/TR; Mild AS with MPG 13.0 mmHg.  ?  ?Past Surgical History:  ?Procedure Laterality Date  ? APPENDECTOMY    ? APPLICATION OF INTRAOPERATIVE CT SCAN N/A 05/23/2021  ? Procedure: APPLICATION OF INTRAOPERATIVE CT SCAN;  Surgeon: Meade Maw, MD;  Location: [facility] ORS;  Service: Neurosurgery;  Laterality: N/A;  ? BACK SURGERY    ? lumbar fusion  ? BUNIONECTOMY Right   ? CATARACT EXTRACTION, BILATERAL    ? CESAREAN SECTION    ? x1  ? CHOLECYSTECTOMY N/A 05/11/2016  ? Procedure: LAPAROSCOPIC CHOLECYSTECTOMY;  Surgeon: Florene Glen, MD;  Location: [facility] ORS;  Service: General;  Laterality: N/A;  ? COLONOSCOPY WITH PROPOFOL N/A 04/02/2016  ? Procedure: COLONOSCOPY  WITH PROPOFOL;  Surgeon: Jonathon Bellows, MD;  Location: [facility] ENDOSCOPY;  Service: Endoscopy;  Laterality: N/A;  ? ENDOSCOPIC RETROGRADE CHOLANGIOPANCREATOGRAPHY (ERCP) WITH PROPOFOL N/A 05/08/2016  ? Procedure: ENDOSCOPIC RETROGRADE CHOLANGIOPANCREATOGRAPHY (ERCP) WITH PROPOFOL;  Surgeon: Lucilla Lame, MD;  Location: [facility] ENDOSCOPY;  Service: Endoscopy;  Laterality: N/A;  ? ERCP    ? with biliary spincterotomy 05/08/16 Dr. Allen Norris for choledocholithiasis   ? ESOPHAGEAL DILATION  04/02/2016  ? Procedure: ESOPHAGEAL DILATION;  Surgeon: Jonathon Bellows, MD;  Location: [facility] ENDOSCOPY;  Service: Endoscopy;;  ? ESOPHAGOGASTRODUODENOSCOPY (EGD)  WITH PROPOFOL N/A 04/02/2016  ? Procedure: ESOPHAGOGASTRODUODENOSCOPY (EGD) WITH PROPOFOL;  Surgeon: Jonathon Bellows, MD;  Location: [facility] ENDOSCOPY;  Service: Endoscopy;  Laterality: N/A;  ? HIP ARTHROPLASTY Right 11/01/2016  ? Procedure: ARTHROPLASTY BIPOLAR HIP (HEMIARTHROPLASTY);  Surgeon: Corky Mull, MD;  Location: [facility] ORS;  Service: Orthopedics;  Laterality: Right;  ? LUMBAR LAMINECTOMY/DECOMPRESSION MICRODISCECTOMY N/A 12/11/2020  ? Procedure: LEFT L2-3 MICRODISCECTOMY, L3-4 AND L5-S1 DECOMPRESSION;  Surgeon: Meade Maw, MD;  Location: [facility] ORS;  Service: Neurosurgery;  Laterality: N/A;  ? NEPHRECTOMY  1988  ? 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 ORIF SCAPHOID FRACTURE Left 12/21/2019  ? Procedure: OPEN REDUCTION INTERNAL FIXATION (ORIF) OF LEFT SCAPULAR NONUNION WITH BONE GRAFT;  Surgeon: Corky Mull, MD;  Location: [facility] ORS;  Service: Orthopedics;  Laterality: Left;  ? osteoporosis    ? noted DEXA 08/19/16   ? REPLACEMENT TOTAL KNEE Right   ? REVERSE SHOULDER ARTHROPLASTY Right 11/04/2017  ? Procedure: REVERSE SHOULDER ARTHROPLASTY;  Surgeon: Corky Mull, MD;  Location: [facility] ORS;  Service: Orthopedics;  Laterality: Right;  ? REVERSE SHOULDER ARTHROPLASTY Left 07/26/2018  ? Procedure: REVERSE SHOULDER ARTHROPLASTY;  Surgeon: Corky Mull, MD;  Location: [facility] ORS;  Service: Orthopedics;  Laterality: Left;  ? TONSILLECTOMY    ? TOTAL HIP ARTHROPLASTY  12/10/11  ? [place] left hip  ? TOTAL HIP ARTHROPLASTY Bilateral   ? TRANSFORAMINAL LUMBAR INTERBODY FUSION (TLIF) WITH PEDICLE SCREW FIXATION 3 LEVEL N/A 05/23/2021  ? Procedure: OPEN L2-4 TRANSFORAMINAL LUMBAR INTERBODY FUSION (TLIF) WITH L2-5 POSTERIOR SPINAL FUSION;  Surgeon: Meade Maw, MD;  Location: [facility] ORS;  Service: Neurosurgery;  Laterality: N/A;  ? TUBAL LIGATION    ?  ?Medications Prior to Admission  ?Medication Sig Dispense Refill Last Dose  ? Adalimumab 40 MG/0.4ML PNKT Inject 40 mg into the skin every 14  (fourteen) days.   Past Month  ? albuterol (VENTOLIN HFA) 108 (90 Base) MCG/ACT inhaler Inhale 2 puffs into the lungs every 6 (six) hours as needed for wheezing or shortness of breath. 18 g 11 05/23/2021  ? amLODipine (NORVASC) 2.5 MG tablet Take 1 tablet (2.5 mg total) by mouth daily. (Patient taking differently: Take 5 mg by mouth 2 (two) times daily.) 90 tablet 3 05/23/2021  ? amoxicillin-clavulanate (AUGMENTIN) 875-125 MG tablet Take 1 tablet by mouth 2 (two) times daily. With food x 7-10 days 20 tablet 0 05/22/2021  ? budesonide (PULMICORT) 0.25 MG/2ML nebulizer solution USE 1 VIAL  IN  NEBULIZER TWICE  DAILY - rinse mouth after treatment 60 mL 11 05/23/2021  ? Cholecalciferol (VITAMIN D3) 50 MCG (2000 UT) TABS Take 4,000 Units by mouth daily.   05/22/2021  ? clopidogrel (PLAVIX) 75 MG tablet Take 75 mg by mouth daily.   05/16/2021  ? cyclobenzaprine (FLEXERIL) 10 MG tablet Take 1 tablet (10 mg total) by mouth 3 (three) times daily as needed for muscle spasms. 30 tablet  0 05/22/2021  ? dicyclomine (BENTYL) 10 MG capsule Take 1 capsule (10 mg total) by mouth 2 (two) times daily as needed for spasms. (Patient taking differently: Take 20 mg by mouth 2 (two) times daily.) 180 capsule 3 05/23/2021  ? erythromycin ophthalmic ointment Place 1 application. into both eyes 3 (three) times daily. X4-7 days 3.5 g 0 05/22/2021  ? escitalopram (LEXAPRO) 10 MG tablet TAKE 1 TABLET BY MOUTH EVERY DAY 90 tablet 1 05/22/2021  ? famotidine (PEPCID) 20 MG tablet TAKE 1 TABLET (20 MG TOTAL) BY MOUTH DAILY. BEFORE BREAKFAST OR DINNER 90 tablet 1 05/23/2021  ? fluconazole (DIFLUCAN) 150 MG tablet TAKE 1 TABLET (150 MG TOTAL) BY MOUTH ONCE FOR 1 DOSE. REPEAT 2ND DOSE IN 3-7 DAYS AS NEEDED 3 tablet 0 05/22/2021  ? fluticasone (FLONASE) 50 MCG/ACT nasal spray Place 2 sprays into both nostrils daily. In am   05/22/2021  ? gabapentin (NEURONTIN) 300 MG capsule Take 2 capsules (600 mg total) by mouth in the morning and at bedtime. 360 capsule 3  05/23/2021  ? ipratropium-albuterol (DUONEB) 0.5-2.5 (3) MG/3ML SOLN TAKE 3 MLS BY NEBULIZATION 3 (THREE) TIMES DAILY AS NEEDED. (Patient taking differently: Take 3 mLs by nebulization 2 (two) times daily.) 360 mL

## 2021-05-29 NOTE — Anesthesia Postprocedure Evaluation (Signed)
Anesthesia Post Note ? ?Patient: Kerri Carter ? ?Procedure(s) Performed: OPEN L2-4 TRANSFORAMINAL LUMBAR INTERBODY FUSION (TLIF) WITH L2-5 POSTERIOR SPINAL FUSION (Spine Lumbar) ?APPLICATION OF INTRAOPERATIVE CT SCAN (Spine Lumbar) ? ?Patient location during evaluation: PACU ?Anesthesia Type: General ?Level of consciousness: awake and oriented ?Pain management: satisfactory to patient ?Vital Signs Assessment: post-procedure vital signs reviewed and stable ?Respiratory status: spontaneous breathing and respiratory function stable ?Cardiovascular status: stable ?Anesthetic complications: no ? ? ?No notable events documented. ? ? ?Last Vitals:  ?Vitals:  ? 05/29/21 0500 05/29/21 0600  ?BP: 137/60 129/64  ?Pulse: 70 69  ?Resp: 11 10  ?Temp:    ?SpO2: 97% 97%  ?  ?Last Pain:  ?Vitals:  ? 05/29/21 0300  ?TempSrc: Oral  ?PainSc: Asleep  ? ? ?  ?  ?  ?  ?  ?  ? ?VAN STAVEREN,Merrick Feutz ? ? ? ? ?

## 2021-05-29 NOTE — Progress Notes (Signed)
Occupational Therapy Treatment ?Patient Details ?Name: Kerri Carter ?MRN: [id number] ?DOB: 04/10/1940 ?Today's Date: 05/29/2021 ? ? ?History of present illness Pt is a 81 y/o F admitted to hospital who presents with spondylithesis and lumbar radiculopathy and has failed conservative management. Pt is now s/p L 3/4 & 2/3 fusion with posterolateral arthrodesis from L2 to L5.  Pt noted with acute hypoxia with dyspnea 4/24 and transferred to ICU (pt with acute respiratory failure with hypoxia, L LL PNA, acute on chronic combined systolic and diastolic CHF, AKI superimposed on CKD, and s/p lumbar fusion). PMH: anxiety, aortic stenosis, asthma, CKD, CVA, depression, GERD/PUD, retention, interstitial lung disease, PAD, bipolar disorder, conversion disorder, heart murmur, HLD. ?  ?OT comments ? Pt seen for OT tx this date. Pt endorsing feeling a bit better than this morning when she was more SOB. Pt agreeable to OT tx. Pt required MIN A for bed mobility to maintain back precautions and minimize pain. Once seated EOB pt endorsing increased SOB. VSS, SpO2 93-99% on 10L. Educated in PLB requiring additional VC to utilize. Pt stood from the bed with CGA-MIN A and took a couple steps to the recliner. Pt required additional time to recover with emphasis on PLB and reassurance that her O2 was good. Pt left with PT for additional therapy.   ? ?Recommendations for follow up therapy are one component of a multi-disciplinary discharge planning process, led by the attending physician.  Recommendations may be updated based on patient status, additional functional criteria and insurance authorization. ?   ?Follow Up Recommendations ? Skilled nursing-short term rehab (<3 hours/day)  ?  ?Assistance Recommended at Discharge Frequent or constant Supervision/Assistance  ?Patient can return home with the following ? A lot of help with bathing/dressing/bathroom;Assist for transportation;Help with stairs or ramp for entrance;A little help with  walking and/or transfers;Assistance with cooking/housework ?  ?Equipment Recommendations ? None recommended by OT  ?  ?Recommendations for Other Services   ? ?  ?Precautions / Restrictions Precautions ?Precautions: Fall;Back ?Precaution Booklet Issued: No ?Required Braces or Orthoses: Spinal Brace ?Spinal Brace: Thoracolumbosacral orthotic;Applied in sitting position (per MD, recommend use but does not require) ?Restrictions ?Weight Bearing Restrictions: No  ? ? ?  ? ?Mobility Bed Mobility ?Overal bed mobility: Needs Assistance ?Bed Mobility: Rolling, Sidelying to Sit ?Rolling: Min assist ?Sidelying to sit: Min assist ?  ?  ?  ?  ?  ? ?Transfers ?Overall transfer level: Needs assistance ?Equipment used: Rolling walker (2 wheels) ?Transfers: Sit to/from Stand, Bed to chair/wheelchair/BSC ?Sit to Stand: From elevated surface, Min guard ?  ?  ?Step pivot transfers: Min assist, From elevated surface ?  ?  ?  ?  ?  ?Balance Overall balance assessment: Needs assistance ?Sitting-balance support: Feet supported, Bilateral upper extremity supported, Single extremity supported ?Sitting balance-Leahy Scale: Fair ?Sitting balance - Comments: required UE vs BUE support on EOB 2/2 SOB/fatigue ?  ?Standing balance support: Reliant on assistive device for balance, Bilateral upper extremity supported, During functional activity ?Standing balance-Leahy Scale: Fair ?Standing balance comment: pt has limited standing tolerance due to SOB/anxiety ?  ?  ?  ?  ?  ?  ?  ?  ?  ?  ?  ?   ? ?ADL either performed or assessed with clinical judgement  ? ?ADL   ?  ?  ?  ?  ?  ?  ?  ?  ?  ?  ?  ?  ?  ?  ?  ?  ?  ?  ?  ?  ?  ? ?  Extremity/Trunk Assessment   ?  ?  ?  ?  ?  ? ?Vision   ?  ?  ?Perception   ?  ?Praxis   ?  ? ?Cognition Arousal/Alertness: Awake/alert ?Behavior During Therapy: Anxious ?Overall Cognitive Status: Within Functional Limits for tasks assessed ?  ?  ?  ?  ?  ?  ?  ?  ?  ?  ?  ?  ?  ?  ?  ?  ?General Comments: anxious, SOB with  limited exertion, however VSS with SpO2 >93% on 10L throughout session ?  ?  ?   ?Exercises   ? ?  ?Shoulder Instructions   ? ? ?  ?General Comments    ? ? ?Pertinent Vitals/ Pain       Pain Assessment ?Pain Assessment: No/denies pain ? ?Home Living   ?  ?  ?  ?  ?  ?  ?  ?  ?  ?  ?  ?  ?  ?  ?  ?  ?  ?  ? ?  ?Prior Functioning/Environment    ?  ?  ?  ?   ? ?Frequency ? Min 2X/week  ? ? ? ? ?  ?Progress Toward Goals ? ?OT Goals(current goals can now be found in the care plan section) ? Progress towards OT goals: Progressing toward goals ? ?Acute Rehab OT Goals ?Patient Stated Goal: to have less pain ?OT Goal Formulation: With patient ?Time For Goal Achievement: 06/06/21 ?Potential to Achieve Goals: Good  ?Plan Discharge plan remains appropriate;Frequency remains appropriate   ? ?Co-evaluation ? ? ?   ?  ?PT goals addressed during session: Balance;Mobility/safety with mobility;Strengthening/ROM;Proper use of DME ?  ?  ? ?  ?AM-PAC OT "6 Clicks" Daily Activity     ?Outcome Measure ? ? Help from another person eating meals?: A Little ?Help from another person taking care of personal grooming?: A Little ?Help from another person toileting, which includes using toliet, bedpan, or urinal?: A Little ?Help from another person bathing (including washing, rinsing, drying)?: A Lot ?Help from another person to put on and taking off regular upper body clothing?: A Little ?Help from another person to put on and taking off regular lower body clothing?: A Lot ?6 Click Score: 16 ? ?  ?End of Session Equipment Utilized During Treatment: Gait belt;Rolling walker (2 wheels);Oxygen ? ?OT Visit Diagnosis: Other abnormalities of gait and mobility (R26.89);Muscle weakness (generalized) (M62.81) ?  ?Activity Tolerance Other (comment) (anxious, feeling SOB) ?  ?Patient Left in chair;with call bell/phone within reach;Other (comment) (PT in room for additional therapy) ?  ?Nurse Communication   ?  ? ?   ? ?Time: 6468-0321 ?OT Time Calculation  (min): 13 min ? ?Charges: OT General Charges ?$OT Visit: 1 Visit ?OT Treatments ?$Self Care/Home Management : 8-22 mins ? ?Ardeth Perfect., MPH, MS, OTR/L ?ascom [phone] ?05/29/21, 5:03 PM ? ?

## 2021-05-29 NOTE — Progress Notes (Signed)
? ? ? ?Progress Note  ? ? ?Kerri Carter  UYQ:[id number] DOB: 1940/12/30  DOA: 05/23/2021 ?PCP: McLean-Scocuzza, Nino Glow, MD  ? ? ? ? ?Brief Narrative:  ? ? ?Medical records reviewed and are as summarized below: ? ?Kerri Carter is a 81 y.o. female with medical history significant for COPD, asthma, bipolar disorder, depression, hypertension, stage III chronic kidney disease,  dyslipidemia, GERD, PAD, peptic ulcer disease, s/p lumbar fusion, new disc herniation at L3-L4 with severe stenosis.  She presented to the hospital for transforaminal lumbar interbody fusion L3/4 and 2/3 and posterolateral arthrodesis L2-L5 on 05/23/2021. ? ?She developed acute hypoxic respiratory failure from pneumonia and acute exacerbation of chronic diastolic CHF.  She required BiPAP and oxygen via high flow nasal cannula for hypoxia.  She was treated with empiric IV antibiotics and IV Lasix.  She developed paroxysmal atrial fibrillation and cardiologist was consulted to assist with management.  She developed AKI likely from IV diuresis. ? ? ?As per NP Meda Coffee (ICU): ?4/21: Underwent elective lumbar fusion with Dr. [name] ?4/22: Declining DVT prophylaxis (Lovenox). ?4/24: Acute Hypoxia and Respiratory Distress requiring BiPAP.  Hospitalist and PCCM consulted.  Aggressive diuresis per Cardiology, empirically covering for pneumonia.  Rule out DVT/PE. ?4/24: NM Pulmonary Perfusion pulmonary embolism absent ?4/24: Echo revealed EF 50 to 55%, left grade II diastolic dysfunction (pseudonormalization) ?4/25: Pt stable on 15L HFNC, PCCM will sign off  ? ? ? ?Assessment/Plan:  ? ?Principal Problem: ?  Acute respiratory failure with hypoxia ([place]) ?Active Problems: ?  Left lower lobe pneumonia ?  Acute on chronic combined systolic and diastolic CHF (congestive heart failure) ([place]) ?  S/P lumbar fusion ?  Acute kidney injury superimposed on chronic kidney disease ([place]) ?  Chronic obstructive pulmonary disease (COPD) ([place]) ?  Peripheral neuropathy ?   Elevated d-dimer ?  Hyperkalemia ?  Anxiety and depression ? ? ?Body mass index is 33.82 kg/m?.  (Obesity) ? ?Acute on chronic hypoxic respiratory failure: Oxygen requirement went from 5 to 15 L/min via HFNC overnight.  Oxygen has been weaned down to 10 L/min.  Continue to taper down oxygen as able.  She uses 2 L/min oxygen at night for OSA. ? ?Left lower lobe pneumonia, suspected aspiration: Repeat chest x-ray on 05/29/2021 showed persistent dense left perihilar airspace opacity but slightly improved compared to previous study.  Change IV Unasyn and azithromycin to oral Augmentin and azithromycin. ? ?Acute on chronic diastolic CHF, moderate to severe aortic stenosis, severe mitral regurgitation: Restart IV Lasix because of worsening shortness of breath. ? ?AKI on CKD stage IIIa: Monitor BMP closely. ? ?Paroxysmal atrial fibrillation, new onset: Continue nebivolol.  Transfer score is 4.  Cardiologist wants to wait until 14 days before considering long-term anticoagulation. ? ?COPD exacerbation: Continue IV steroids and bronchodilators. ? ?Type II DM with hyperglycemia: Hemoglobin A1c was 6.0.  Use NovoLog as needed for hyperglycemia. ? ?Generalized weakness, s/p transforaminal lumbar interbody fusion L3/4 and 2/3, posterolateral arthrodesis L2-L5 on 05/23/2021: Analgesics as needed for pain.  Continue PT and OT.  Discharge to SNF recommended. ? ?Patient requested Xanax for anxiety.  However, I explained that Xanax could potentially cause some confusion and sedation given her respiratory issues.  Trial of BuSpar for anxiety. ? ?Other comorbidities include anxiety, depression, anemia of chronic disease, OSA on home oxygen at night ? ?Diet Order   ? ?       ?  Diet Heart Room service appropriate? Yes; Fluid consistency: Thin  Diet effective now       ?  ? ?  ?  ? ?  ? ? ? ? ? ? ? ? ? ? ? ?  Consultants: ?Intensivist ?Cardiologist ? ?Procedures: ?Back surgery on 05/23/2021 ? ? ? ?Medications:  ? ? acetaminophen  1,000 mg  Oral Q6H  ? amLODipine  5 mg Oral Daily  ? amoxicillin-clavulanate  1 tablet Oral Q12H  ? azithromycin  500 mg Oral Daily  ? budesonide  0.25 mg Nebulization BID  ? Chlorhexidine Gluconate Cloth  6 each Topical Daily  ? Chlorhexidine Gluconate Cloth  6 each Topical Q0600  ? cholecalciferol  4,000 Units Oral Daily  ? dicyclomine  20 mg Oral BID  ? docusate sodium  100 mg Oral BID  ? [START ON 05/30/2021] enoxaparin (LOVENOX) injection  40 mg Subcutaneous Q24H  ? escitalopram  10 mg Oral Daily  ? famotidine  20 mg Oral Daily  ? fluticasone  2 spray Each Nare Daily  ? furosemide  40 mg Intravenous Daily  ? gabapentin  600 mg Oral BID  ? guaiFENesin  600 mg Oral BID  ? insulin aspart  0-5 Units Subcutaneous QHS  ? insulin aspart  0-9 Units Subcutaneous TID WC  ? ipratropium-albuterol  3 mL Nebulization BID  ? lamoTRIgine  100 mg Oral BID  ? leflunomide  20 mg Oral Daily  ? lidocaine  1 patch Transdermal Daily  ? methocarbamol  500 mg Oral Q6H  ? methylPREDNISolone (SOLU-MEDROL) injection  20 mg Intravenous Q12H  ? montelukast  10 mg Oral Daily  ? multivitamin-lutein  1 capsule Oral Daily  ? nebivolol  5 mg Oral Daily  ? QUEtiapine  25 mg Oral QHS  ? senna  1 tablet Oral BID  ? sodium chloride flush  3 mL Intravenous Q12H  ? sucralfate  1 g Oral BID  ? ?Continuous Infusions: ? sodium chloride    ? methocarbamol (ROBAXIN) IV Stopped (05/28/21 0237)  ? ? ? ?Anti-infectives (From admission, onward)  ? ? Start     Dose/Rate Route Frequency Ordered Stop  ? 05/29/21 1800  amoxicillin-clavulanate (AUGMENTIN) 875-125 MG per tablet 1 tablet       ? 1 tablet Oral Every 12 hours 05/29/21 0840 05/31/21 2159  ? 05/29/21 1000  amoxicillin-clavulanate (AUGMENTIN) 875-125 MG per tablet 1 tablet  Status:  Discontinued       ? 1 tablet Oral Every 12 hours 05/29/21 0839 05/29/21 0840  ? 05/29/21 1000  azithromycin (ZITHROMAX) tablet 500 mg       ? 500 mg Oral Daily 05/29/21 0839 05/31/21 0959  ? 05/27/21 1000  azithromycin (ZITHROMAX) 500  mg in sodium chloride 0.9 % 250 mL IVPB  Status:  Discontinued       ? 500 mg ?250 mL/hr over 60 Minutes Intravenous Daily 05/26/21 1648 05/29/21 0839  ? 05/26/21 2000  Ampicillin-Sulbactam (UNASYN) 3 g in sodium chloride 0.9 % 100 mL IVPB  Status:  Discontinued       ? 3 g ?200 mL/hr over 30 Minutes Intravenous Every 12 hours 05/26/21 1656 05/29/21 0839  ? 05/26/21 1015  azithromycin (ZITHROMAX) tablet 500 mg  Status:  Discontinued       ? 500 mg Oral Daily 05/26/21 0921 05/26/21 1443  ? 05/26/21 0645  levofloxacin (LEVAQUIN) IVPB 750 mg  Status:  Discontinued       ? 750 mg ?100 mL/hr over 90 Minutes Intravenous Every 24 hours 05/26/21 0546 05/26/21 0556  ? 05/26/21 0645  azithromycin (ZITHROMAX) 500 mg in sodium chloride 0.9 % 250 mL IVPB  Status:  Discontinued       ? 500 mg ?250 mL/hr over 60  Minutes Intravenous Every 24 hours 05/26/21 0547 05/26/21 0919  ? 05/26/21 0645  Ampicillin-Sulbactam (UNASYN) 3 g in sodium chloride 0.9 % 100 mL IVPB  Status:  Discontinued       ? 3 g ?200 mL/hr over 30 Minutes Intravenous Every 12 hours 05/26/21 0557 05/26/21 1443  ? 05/23/21 1044  vancomycin (VANCOCIN) powder  Status:  Discontinued       ?   As needed 05/23/21 1044 05/23/21 1137  ? 05/23/21 0654  ceFAZolin (ANCEF) IVPB 2g/100 mL premix       ? 2 g ?200 mL/hr over 30 Minutes Intravenous 60 min pre-op 05/23/21 0655 05/23/21 0745  ? 05/23/21 0652  vancomycin (VANCOCIN) 1-5 GM/200ML-% IVPB       ?Note to Pharmacy: Trudie Reed S: cabinet override  ?    05/23/21 0652 05/23/21 0855  ? 05/23/21 0652  ceFAZolin (ANCEF) 2-4 GM/100ML-% IVPB       ?Note to Pharmacy: Trudie Reed S: cabinet override  ?    05/23/21 0652 05/23/21 0855  ? 05/23/21 0623  vancomycin (VANCOCIN) IVPB 1000 mg/200 mL premix       ? 1,000 mg ?200 mL/hr over 60 Minutes Intravenous 60 min pre-op 05/23/21 4098 05/23/21 0900  ? ?  ? ? ? ? ? ? ? ? ? ?Family Communication/Anticipated D/C date and plan/Code Status  ? ?DVT prophylaxis: enoxaparin (LOVENOX) injection  40 mg Start: 05/30/21 1000 ?Place and maintain sequential compression device Start: 05/27/21 0428 ?SCD's Start: 05/23/21 1214 ? ? ?  Code Status: DNR ? ?Family Communication: None ?Disposition Plan: Plan to d

## 2021-05-29 NOTE — Progress Notes (Signed)
After discussing pts room transfer with pt and her son it became evident that they nor I felt comfortable moving them to a med surg floor. Pts respiratory needs have become more demanding since original order for med surg transfer was placed. Messaged MD to transfer to PCU instead of med surg, MD approved, order for pcu transfer order was placed. Patient and son notified they were satisfied with new order. Will continue to monitor. ?

## 2021-05-29 NOTE — Progress Notes (Signed)
PT Cancellation Note ? ?Patient Details ?Name: LACHINA SALSBERRY ?MRN: [id number] ?DOB: [birth year] ? ? ?Cancelled Treatment:     PT attempt. PT hold at this time. Pt endorsing not feeling well. Just had chest x ray and has increased O2 requirement from previous date. Per pt request, Pryor Curia will return later this afternoon.  ? ? ?Willette Pa ?05/29/2021, 9:58 AM ?

## 2021-05-29 NOTE — Progress Notes (Signed)
Physical Therapy Treatment ?Patient Details ?Name: Kerri Carter ?MRN: [id number] ?DOB: Sep 27, 1940 ?Today's Date: 05/29/2021 ? ? ?History of Present Illness Pt is a 81 y/o F admitted to hospital who presents with spondylithesis and lumbar radiculopathy and has failed conservative management. Pt is now s/p L 3/4 & 2/3 fusion with posterolateral arthrodesis from L2 to L5.  Pt noted with acute hypoxia with dyspnea 4/24 and transferred to ICU (pt with acute respiratory failure with hypoxia, L LL PNA, acute on chronic combined systolic and diastolic CHF, AKI superimposed on CKD, and s/p lumbar fusion). PMH: anxiety, aortic stenosis, asthma, CKD, CVA, depression, GERD/PUD, retention, interstitial lung disease, PAD, bipolar disorder, conversion disorder, heart murmur, HLD. ? ?  ?PT Comments  ? ? Pt had just finished working with OT upon arriving. She is A and O and endorsing feeling better than she did earlier in the day. She was on 10 L HFNC but able to wean to 8L with sao2 > 96%. RN made aware post session. Pt continues to be anxious about mobility. She becomes SOB with very minimal activity. She requested not to ambulate however was easily able to stand and march in place with CGA 3 x 20 reps. Pt needs prolonged recovery time due to SOB but vitals were stable throughout. PT will continue to follow and progress as able per current POC. She will benefit from SNF at DC to address deficits while maximizing independence with ADLs.  ?   ?Recommendations for follow up therapy are one component of a multi-disciplinary discharge planning process, led by the attending physician.  Recommendations may be updated based on patient status, additional functional criteria and insurance authorization. ? ?Follow Up Recommendations ? Skilled nursing-short term rehab (<3 hours/day) ?  ?  ?Assistance Recommended at Discharge Frequent or constant Supervision/Assistance  ?Patient can return home with the following A lot of help with walking  and/or transfers;A lot of help with bathing/dressing/bathroom;Direct supervision/assist for financial management;Assistance with cooking/housework;Help with stairs or ramp for entrance;Assist for transportation ?  ?Equipment Recommendations ? Other (comment) (defer to next level of care)  ?  ?   ?Precautions / Restrictions Precautions ?Precautions: Fall;Back ?Precaution Booklet Issued: No ?Required Braces or Orthoses: Spinal Brace ?Spinal Brace: Thoracolumbosacral orthotic;Applied in sitting position (per MD, recommend use but does not require) ?Restrictions ?Weight Bearing Restrictions: No  ?  ? ?Mobility ? Bed Mobility ?   ?General bed mobility comments: pt was seated EOB upon arriving ?  ? ?Transfers ?Overall transfer level: Needs assistance ?Equipment used: Rolling walker (2 wheels) ?Transfers: Sit to/from Stand ?Sit to Stand: Min assist, From elevated surface ?  ?General transfer comment: min assist to stand from recliner height surface. she stood 1 x from EOB and 3 x from recliner. ?  ? ?Ambulation/Gait ?   ?General Gait Details: pt was too anxious to attempt ambulation but was easily able to perform marching in place 3 x 20(alternating) author discussed with pt the oimportance of ambulation." I will try tomorrow." ? ?  ?Balance Overall balance assessment: Needs assistance ?Sitting-balance support: Feet supported, Bilateral upper extremity supported ?Sitting balance-Leahy Scale: Fair ?  ?  ?Standing balance support: Reliant on assistive device for balance, Bilateral upper extremity supported, During functional activity ?Standing balance-Leahy Scale: Fair ?Standing balance comment: pt has limited standing tolerance due to SOB/anxiety ?  ?  ?   ?Cognition Arousal/Alertness: Awake/alert ?Behavior During Therapy: Anxious ?Overall Cognitive Status: Within Functional Limits for tasks assessed ?  ?   ?General Comments: Pt  is A and O x 4 but quickly gets SOB with minimal activity. Author feels anxiety plays large role  in limitations ?  ?  ? ?  ?   ?   ? ?Pertinent Vitals/Pain Pain Assessment ?Pain Assessment: No/denies pain  ? ? ? ?PT Goals (current goals can now be found in the care plan section) Acute Rehab PT Goals ?Patient Stated Goal: improve pain and mobility ?Progress towards PT goals: Progressing toward goals ? ?  ?Frequency ? ? ? 7X/week ? ? ? ?  ?PT Plan Current plan remains appropriate  ? ? ?Co-evaluation   ?  ?PT goals addressed during session: Balance;Mobility/safety with mobility;Strengthening/ROM;Proper use of DME ?  ?  ? ?  ?AM-PAC PT "6 Clicks" Mobility   ?Outcome Measure ? Help needed turning from your back to your side while in a flat bed without using bedrails?: A Little ?Help needed moving from lying on your back to sitting on the side of a flat bed without using bedrails?: A Lot ?Help needed moving to and from a bed to a chair (including a wheelchair)?: A Lot ?Help needed standing up from a chair using your arms (e.g., wheelchair or bedside chair)?: A Little ?Help needed to walk in hospital room?: A Little ?Help needed climbing 3-5 steps with a railing? : A Lot ?6 Click Score: 15 ? ?  ?End of Session Equipment Utilized During Treatment: Oxygen (10 L HFNC but weaned to 8 L during session. RN aware post session) ?Activity Tolerance: Patient tolerated treatment well ?Patient left: in chair;with call bell/phone within reach;with chair alarm set ?Nurse Communication: Mobility status;Precautions ?PT Visit Diagnosis: Unsteadiness on feet (R26.81);Pain;Muscle weakness (generalized) (M62.81);Difficulty in walking, not elsewhere classified (R26.2);History of falling (Z91.81);Repeated falls (R29.6) ?  ? ? ?Time: 0867-6195 ?PT Time Calculation (min) (ACUTE ONLY): 24 min ? ?Charges:  $Therapeutic Activity: 23-37 mins          ?          ?Julaine Fusi PTA ?05/29/21, 3:47 PM  ? ?

## 2021-05-30 DIAGNOSIS — J9601 Acute respiratory failure with hypoxia: Secondary | ICD-10-CM | POA: Diagnosis not present

## 2021-05-30 DIAGNOSIS — J690 Pneumonitis due to inhalation of food and vomit: Secondary | ICD-10-CM | POA: Diagnosis not present

## 2021-05-30 DIAGNOSIS — I5043 Acute on chronic combined systolic (congestive) and diastolic (congestive) heart failure: Secondary | ICD-10-CM | POA: Diagnosis not present

## 2021-05-30 DIAGNOSIS — J441 Chronic obstructive pulmonary disease with (acute) exacerbation: Secondary | ICD-10-CM | POA: Diagnosis not present

## 2021-05-30 LAB — CBC WITH DIFFERENTIAL/PLATELET
Abs Immature Granulocytes: 0.34 10*3/uL — ABNORMAL HIGH (ref 0.00–0.07)
Basophils Absolute: 0.1 10*3/uL (ref 0.0–0.1)
Basophils Relative: 0 %
Eosinophils Absolute: 0 10*3/uL (ref 0.0–0.5)
Eosinophils Relative: 0 %
HCT: 29.3 % — ABNORMAL LOW (ref 36.0–46.0)
Hemoglobin: 9 g/dL — ABNORMAL LOW (ref 12.0–15.0)
Immature Granulocytes: 3 %
Lymphocytes Relative: 18 %
Lymphs Abs: 2.2 10*3/uL (ref 0.7–4.0)
MCH: 26.5 pg (ref 26.0–34.0)
MCHC: 30.7 g/dL (ref 30.0–36.0)
MCV: 86.4 fL (ref 80.0–100.0)
Monocytes Absolute: 1 10*3/uL (ref 0.1–1.0)
Monocytes Relative: 8 %
Neutro Abs: 8.4 10*3/uL — ABNORMAL HIGH (ref 1.7–7.7)
Neutrophils Relative %: 71 %
Platelets: 279 10*3/uL (ref 150–400)
RBC: 3.39 MIL/uL — ABNORMAL LOW (ref 3.87–5.11)
RDW: 14.8 % (ref 11.5–15.5)
WBC: 12 10*3/uL — ABNORMAL HIGH (ref 4.0–10.5)
nRBC: 0.5 % — ABNORMAL HIGH (ref 0.0–0.2)

## 2021-05-30 LAB — BASIC METABOLIC PANEL
Anion gap: 9 (ref 5–15)
BUN: 44 mg/dL — ABNORMAL HIGH (ref 8–23)
CO2: 30 mmol/L (ref 22–32)
Calcium: 8.2 mg/dL — ABNORMAL LOW (ref 8.9–10.3)
Chloride: 101 mmol/L (ref 98–111)
Creatinine, Ser: 1.08 mg/dL — ABNORMAL HIGH (ref 0.44–1.00)
GFR, Estimated: 53 mL/min — ABNORMAL LOW (ref >60)
Glucose, Bld: 125 mg/dL — ABNORMAL HIGH (ref 70–99)
Potassium: 3.9 mmol/L (ref 3.5–5.1)
Sodium: 140 mmol/L (ref 135–145)

## 2021-05-30 LAB — GLUCOSE, CAPILLARY
Glucose-Capillary: 112 mg/dL — ABNORMAL HIGH (ref 70–99)
Glucose-Capillary: 222 mg/dL — ABNORMAL HIGH (ref 70–99)
Glucose-Capillary: 205 mg/dL — ABNORMAL HIGH (ref 70–99)
Glucose-Capillary: 169 mg/dL — ABNORMAL HIGH (ref 70–99)

## 2021-05-30 LAB — MAGNESIUM: Magnesium: 2.6 mg/dL — ABNORMAL HIGH (ref 1.7–2.4)

## 2021-05-30 MED ORDER — PREDNISONE 20 MG PO TABS
20.0000 mg | ORAL_TABLET | Freq: Every day | ORAL | Status: AC
  Administered 2021-05-31 – 2021-06-01 (×2): 20 mg via ORAL
  Filled 2021-05-30 (×2): qty 1

## 2021-05-30 NOTE — Progress Notes (Addendum)
? ? ? ?Progress Note  ? ? ?Kerri Carter  YWV:[id number] DOB: 11-Sep-1940  DOA: 05/23/2021 ?PCP: McLean-Scocuzza, Nino Glow, MD  ? ? ? ? ?Brief Narrative:  ? ? ?Medical records reviewed and are as summarized below: ? ?Kerri Carter is a 81 y.o. female with medical history significant for COPD, asthma, bipolar disorder, depression, hypertension, stage III chronic kidney disease,  dyslipidemia, GERD, PAD, peptic ulcer disease, s/p lumbar fusion, new disc herniation at L3-L4 with severe stenosis.  She presented to the hospital for transforaminal lumbar interbody fusion L3/4 and 2/3 and posterolateral arthrodesis L2-L5 on 05/23/2021. ? ?She developed acute hypoxic respiratory failure from pneumonia and acute exacerbation of chronic diastolic CHF.  She required BiPAP and oxygen via high flow nasal cannula for hypoxia.  She was treated with empiric IV antibiotics and IV Lasix.  She developed paroxysmal atrial fibrillation and cardiologist was consulted to assist with management.  She developed AKI likely from IV diuresis. ? ? ?As per NP Meda Coffee (ICU): ?4/21: Underwent elective lumbar fusion with Dr. [name] ?4/22: Declining DVT prophylaxis (Lovenox). ?4/24: Acute Hypoxia and Respiratory Distress requiring BiPAP.  Hospitalist and PCCM consulted.  Aggressive diuresis per Cardiology, empirically covering for pneumonia.  Rule out DVT/PE. ?4/24: NM Pulmonary Perfusion pulmonary embolism absent ?4/24: Echo revealed EF 50 to 55%, left grade II diastolic dysfunction (pseudonormalization) ?4/25: Pt stable on 15L HFNC, PCCM will sign off  ? ? ? ?Assessment/Plan:  ? ?Principal Problem: ?  Acute respiratory failure with hypoxia ([place]) ?Active Problems: ?  Left lower lobe pneumonia ?  Acute on chronic combined systolic and diastolic CHF (congestive heart failure) ([place]) ?  S/P lumbar fusion ?  Acute kidney injury superimposed on chronic kidney disease ([place]) ?  Chronic obstructive pulmonary disease (COPD) ([place]) ?  Peripheral neuropathy ?   Elevated d-dimer ?  Hyperkalemia ?  Anxiety and depression ? ? ?Body mass index is 33.32 kg/m?.  (Obesity) ? ?Acute on chronic hypoxic respiratory failure: She is down to 5 L/min oxygen via HFNC.  Taper down oxygen as able.  She uses 2 L/min oxygen at night for OSA. ? ?Left lower lobe pneumonia, suspected aspiration: Repeat chest x-ray on 05/29/2021 showed persistent dense left perihilar airspace opacity but slightly improved compared to previous study.  Completed Augmentin and azithromycin on 05/30/2021. ? ?Acute on chronic diastolic CHF, moderate to severe aortic stenosis, severe mitral regurgitation: Continue IV Lasix ? ?AKI on CKD stage IIIa: Creatinine is stable.  Monitor BMP closely. ? ?Paroxysmal atrial fibrillation, new onset: Continue nebivolol.  Transfer score is 4.  Cardiologist wants to wait until 14 days before considering long-term anticoagulation.  Earliest date for anticoagulation will be 06/07/2021. ? ?COPD exacerbation: Change IV Solu-Medrol to oral prednisone.  Continue bronchodilators. ? ?S/p lumbar fusion to L2 on 05/23/2021: Continue PT and OT.  Analgesics as needed for pain.  Outpatient follow-up with neurosurgeon. ? ?Type II DM with hyperglycemia: Hemoglobin A1c was 6.0.  Use NovoLog as needed for hyperglycemia. ? ?Generalized weakness, s/p transforaminal lumbar interbody fusion L3/4 and 2/3, posterolateral arthrodesis L2-L5 on 05/23/2021: Analgesics as needed for pain.  Continue PT and OT.  Discharge to SNF recommended. ? ?Continue BuSpar for anxiety ? ?Other comorbidities include anxiety, depression, anemia of chronic disease, OSA on home oxygen at night ? ?Diet Order   ? ?       ?  Diet Heart Room service appropriate? Yes; Fluid consistency: Thin  Diet effective now       ?  ? ?  ?  ? ?  ? ? ? ? ? ? ? ? ? ? ? ?  Consultants: ?Intensivist ?Cardiologist ? ?Procedures: ?Back surgery on 05/23/2021 ? ? ? ?Medications:  ? ? acetaminophen  1,000 mg Oral Q6H  ? amLODipine  5 mg Oral Daily  ?  amoxicillin-clavulanate  1 tablet Oral Q12H  ? budesonide  0.25 mg Nebulization BID  ? busPIRone  5 mg Oral BID  ? Chlorhexidine Gluconate Cloth  6 each Topical Daily  ? Chlorhexidine Gluconate Cloth  6 each Topical Q0600  ? cholecalciferol  4,000 Units Oral Daily  ? dicyclomine  20 mg Oral BID  ? docusate sodium  100 mg Oral BID  ? enoxaparin (LOVENOX) injection  40 mg Subcutaneous Q24H  ? escitalopram  10 mg Oral Daily  ? famotidine  20 mg Oral Daily  ? fluticasone  2 spray Each Nare Daily  ? furosemide  40 mg Intravenous Daily  ? gabapentin  600 mg Oral BID  ? guaiFENesin  600 mg Oral BID  ? insulin aspart  0-5 Units Subcutaneous QHS  ? insulin aspart  0-9 Units Subcutaneous TID WC  ? ipratropium-albuterol  3 mL Nebulization BID  ? lamoTRIgine  100 mg Oral BID  ? leflunomide  20 mg Oral Daily  ? lidocaine  1 patch Transdermal Daily  ? methocarbamol  500 mg Oral Q6H  ? methylPREDNISolone (SOLU-MEDROL) injection  20 mg Intravenous Q12H  ? montelukast  10 mg Oral Daily  ? multivitamin-lutein  1 capsule Oral Daily  ? nebivolol  5 mg Oral Daily  ? QUEtiapine  25 mg Oral QHS  ? senna  1 tablet Oral BID  ? sodium chloride flush  3 mL Intravenous Q12H  ? sucralfate  1 g Oral BID  ? ?Continuous Infusions: ? sodium chloride    ? methocarbamol (ROBAXIN) IV Stopped (05/28/21 0237)  ? ? ? ?Anti-infectives (From admission, onward)  ? ? Start     Dose/Rate Route Frequency Ordered Stop  ? 05/29/21 1800  amoxicillin-clavulanate (AUGMENTIN) 875-125 MG per tablet 1 tablet       ? 1 tablet Oral Every 12 hours 05/29/21 0840 05/31/21 2159  ? 05/29/21 1000  amoxicillin-clavulanate (AUGMENTIN) 875-125 MG per tablet 1 tablet  Status:  Discontinued       ? 1 tablet Oral Every 12 hours 05/29/21 0839 05/29/21 0840  ? 05/29/21 1000  azithromycin (ZITHROMAX) tablet 500 mg       ? 500 mg Oral Daily 05/29/21 0839 05/30/21 1100  ? 05/27/21 1000  azithromycin (ZITHROMAX) 500 mg in sodium chloride 0.9 % 250 mL IVPB  Status:  Discontinued       ?  500 mg ?250 mL/hr over 60 Minutes Intravenous Daily 05/26/21 1648 05/29/21 0839  ? 05/26/21 2000  Ampicillin-Sulbactam (UNASYN) 3 g in sodium chloride 0.9 % 100 mL IVPB  Status:  Discontinued       ? 3 g ?200 mL/hr over 30 Minutes Intravenous Every 12 hours 05/26/21 1656 05/29/21 0839  ? 05/26/21 1015  azithromycin (ZITHROMAX) tablet 500 mg  Status:  Discontinued       ? 500 mg Oral Daily 05/26/21 0921 05/26/21 1443  ? 05/26/21 0645  levofloxacin (LEVAQUIN) IVPB 750 mg  Status:  Discontinued       ? 750 mg ?100 mL/hr over 90 Minutes Intravenous Every 24 hours 05/26/21 0546 05/26/21 0556  ? 05/26/21 0645  azithromycin (ZITHROMAX) 500 mg in sodium chloride 0.9 % 250 mL IVPB  Status:  Discontinued       ? 500 mg ?250 mL/hr over 60 Minutes Intravenous Every  24 hours 05/26/21 0547 05/26/21 0919  ? 05/26/21 0645  Ampicillin-Sulbactam (UNASYN) 3 g in sodium chloride 0.9 % 100 mL IVPB  Status:  Discontinued       ? 3 g ?200 mL/hr over 30 Minutes Intravenous Every 12 hours 05/26/21 0557 05/26/21 1443  ? 05/23/21 1044  vancomycin (VANCOCIN) powder  Status:  Discontinued       ?   As needed 05/23/21 1044 05/23/21 1137  ? 05/23/21 0654  ceFAZolin (ANCEF) IVPB 2g/100 mL premix       ? 2 g ?200 mL/hr over 30 Minutes Intravenous 60 min pre-op 05/23/21 0655 05/23/21 0745  ? 05/23/21 0652  vancomycin (VANCOCIN) 1-5 GM/200ML-% IVPB       ?Note to Pharmacy: Trudie Reed S: cabinet override  ?    05/23/21 0652 05/23/21 0855  ? 05/23/21 0652  ceFAZolin (ANCEF) 2-4 GM/100ML-% IVPB       ?Note to Pharmacy: Trudie Reed S: cabinet override  ?    05/23/21 0652 05/23/21 0855  ? 05/23/21 0623  vancomycin (VANCOCIN) IVPB 1000 mg/200 mL premix       ? 1,000 mg ?200 mL/hr over 60 Minutes Intravenous 60 min pre-op 05/23/21 9767 05/23/21 0900  ? ?  ? ? ? ? ? ? ? ? ? ?Family Communication/Anticipated D/C date and plan/Code Status  ? ?DVT prophylaxis: enoxaparin (LOVENOX) injection 40 mg Start: 05/30/21 1000 ?Place and maintain sequential compression  device Start: 05/27/21 0428 ?SCD's Start: 05/23/21 1214 ? ? ?  Code Status: DNR ? ?Family Communication: None ?Disposition Plan: Plan to discharge to SNF in 2 to 3 days ? ? ?Status is: Inpatient ?Remains inpatient approp

## 2021-05-30 NOTE — Progress Notes (Deleted)
?[facility] CARDIOLOGY CONSULT NOTE  ? ?    ?Patient ID: ?Kerri Carter ?MRN: [id number] ?DOB/AGE: 13-Mar-1940 81 y.o. ? ?Admit date: 05/23/2021 ?Referring Physician Dr. Eugenie Norrie ?Primary Physician Dr. Olivia Mackie McLean-Scocuzza ?Primary Cardiologist Laqueta Jean, PA-C ?Reason for Consultation acute CHF ? ?HPI: Kerri Carter is a 81 year old female with a PMH of HFpEF (LVEF 50%, g1DD 05/16/21), low flow/low gradient AS, COPD on 2L of O2 chronically, nonobstructive CAD by heart cath 03/2011, hypertension, hyperlipidemia, PAD, CKD3 with history of R nephrectomy 1988, depression, who presented to [facility] for a lumbar fusion extension on 05/23/21 with Dr. [name]. On POD3 (4/24) she became hypoxic in the early morning with associated shortness of breath, with an increased oxygen requirement from HFNC to BiPAP. Cardiology is consulted for further assistance.  ? ?Interval History: ?-Oxygen weaning down from 10L HFNC to 5L HFNC today ?-back in and out of atrial fibrillation and sinus rhythm, rate controlled in the 70s-80s ?-overall feels better today but remains dyspneic with productive cough with blood tinged sputum. No chest pain.  ? ?Review of systems complete and found to be negative unless listed above  ? ?Past Medical History:  ?Diagnosis Date  ? Anxiety   ? Aortic stenosis 07/09/2015  ? a.) TTE 07/06/2015: EF 55-60%; mild AS with MPG 13.0 mmHg.  ? Asthma   ? Bipolar disorder ([place])   ? C. difficile diarrhea 2010  ? a.) following ABX course during hospital admission  ? Carotid atherosclerosis, bilateral   ? a.) Moderate; < 50% stenosis BILATERAL ICAs.  ? Cataract   ? a.) s/p BILATERAL extraction  ? Chicken pox   ? CKD (chronic kidney disease), stage III ([place])   ? a. s/p R nephrectomy./ aneurysm  ? Conversion disorder   ? COPD (chronic obstructive pulmonary disease) ([place])   ? Depression   ? Essential hypertension   ? GERD (gastroesophageal reflux disease)   ? Heart murmur   ? Hyperlipidemia   ? ILD (interstitial lung disease)  ([place])   ? mild; 2/2 RA diagnosis  ? Inflammatory arthritis   ? a. hands/carpal tunnel.  b. Low titer rheumatoid factor. c. Negative anti-CCP antibodies. d. Plaquenil.  ? Macular degeneration   ? Nocturnal hypoxemia   ? Non-Obstructive CAD   ? a. 07/2009 Cath ([facility]): nonobs dzs;  b. 03/2011 Cath [facility]): nonobs dzs.  ? Osteoarthritis   ? a. Knees.  ? PAD (peripheral artery disease) ([place])   ? PUD (peptic ulcer disease)   ? S/P right hip fracture   ? 11/01/16 s/p repair  ? Shoulder pain   ? Sleep apnea   ? no cpap / minimal  ? Spinal stenosis at L4-L5 level   ? severe with L4/L5 anterolisthesis grade 1 anterolisthesis   ? Toxic maculopathy   ? Valvular heart disease   ? a.) TTE 07/06/2015: EF 55-60%; Mild MR/AR/TR; Mild AS with MPG 13.0 mmHg.  ?  ?Past Surgical History:  ?Procedure Laterality Date  ? APPENDECTOMY    ? APPLICATION OF INTRAOPERATIVE CT SCAN N/A 05/23/2021  ? Procedure: APPLICATION OF INTRAOPERATIVE CT SCAN;  Surgeon: Meade Maw, MD;  Location: [facility] ORS;  Service: Neurosurgery;  Laterality: N/A;  ? BACK SURGERY    ? lumbar fusion  ? BUNIONECTOMY Right   ? CATARACT EXTRACTION, BILATERAL    ? CESAREAN SECTION    ? x1  ? CHOLECYSTECTOMY N/A 05/11/2016  ? Procedure: LAPAROSCOPIC CHOLECYSTECTOMY;  Surgeon: Florene Glen, MD;  Location: [facility] ORS;  Service: General;  Laterality:  N/A;  ? COLONOSCOPY WITH PROPOFOL N/A 04/02/2016  ? Procedure: COLONOSCOPY WITH PROPOFOL;  Surgeon: Jonathon Bellows, MD;  Location: [facility] ENDOSCOPY;  Service: Endoscopy;  Laterality: N/A;  ? ENDOSCOPIC RETROGRADE CHOLANGIOPANCREATOGRAPHY (ERCP) WITH PROPOFOL N/A 05/08/2016  ? Procedure: ENDOSCOPIC RETROGRADE CHOLANGIOPANCREATOGRAPHY (ERCP) WITH PROPOFOL;  Surgeon: Lucilla Lame, MD;  Location: [facility] ENDOSCOPY;  Service: Endoscopy;  Laterality: N/A;  ? ERCP    ? with biliary spincterotomy 05/08/16 Dr. Allen Norris for choledocholithiasis   ? ESOPHAGEAL DILATION  04/02/2016  ? Procedure: ESOPHAGEAL DILATION;  Surgeon: Jonathon Bellows, MD;  Location: [facility] ENDOSCOPY;   Service: Endoscopy;;  ? ESOPHAGOGASTRODUODENOSCOPY (EGD) WITH PROPOFOL N/A 04/02/2016  ? Procedure: ESOPHAGOGASTRODUODENOSCOPY (EGD) WITH PROPOFOL;  Surgeon: Jonathon Bellows, MD;  Location: [facility] ENDOSCOPY;  Service: Endoscopy;  Laterality: N/A;  ? HIP ARTHROPLASTY Right 11/01/2016  ? Procedure: ARTHROPLASTY BIPOLAR HIP (HEMIARTHROPLASTY);  Surgeon: Corky Mull, MD;  Location: [facility] ORS;  Service: Orthopedics;  Laterality: Right;  ? LUMBAR LAMINECTOMY/DECOMPRESSION MICRODISCECTOMY N/A 12/11/2020  ? Procedure: LEFT L2-3 MICRODISCECTOMY, L3-4 AND L5-S1 DECOMPRESSION;  Surgeon: Meade Maw, MD;  Location: [facility] ORS;  Service: Neurosurgery;  Laterality: N/A;  ? NEPHRECTOMY  1988  ? 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 ORIF SCAPHOID FRACTURE Left 12/21/2019  ? Procedure: OPEN REDUCTION INTERNAL FIXATION (ORIF) OF LEFT SCAPULAR NONUNION WITH BONE GRAFT;  Surgeon: Corky Mull, MD;  Location: [facility] ORS;  Service: Orthopedics;  Laterality: Left;  ? osteoporosis    ? noted DEXA 08/19/16   ? REPLACEMENT TOTAL KNEE Right   ? REVERSE SHOULDER ARTHROPLASTY Right 11/04/2017  ? Procedure: REVERSE SHOULDER ARTHROPLASTY;  Surgeon: Corky Mull, MD;  Location: [facility] ORS;  Service: Orthopedics;  Laterality: Right;  ? REVERSE SHOULDER ARTHROPLASTY Left 07/26/2018  ? Procedure: REVERSE SHOULDER ARTHROPLASTY;  Surgeon: Corky Mull, MD;  Location: [facility] ORS;  Service: Orthopedics;  Laterality: Left;  ? TONSILLECTOMY    ? TOTAL HIP ARTHROPLASTY  12/10/11  ? [place] left hip  ? TOTAL HIP ARTHROPLASTY Bilateral   ? TRANSFORAMINAL LUMBAR INTERBODY FUSION (TLIF) WITH PEDICLE SCREW FIXATION 3 LEVEL N/A 05/23/2021  ? Procedure: OPEN L2-4 TRANSFORAMINAL LUMBAR INTERBODY FUSION (TLIF) WITH L2-5 POSTERIOR SPINAL FUSION;  Surgeon: Meade Maw, MD;  Location: [facility] ORS;  Service: Neurosurgery;  Laterality: N/A;  ? TUBAL LIGATION    ?  ?Medications Prior to Admission  ?Medication Sig Dispense Refill Last Dose  ? Adalimumab  40 MG/0.4ML PNKT Inject 40 mg into the skin every 14 (fourteen) days.   Past Month  ? albuterol (VENTOLIN HFA) 108 (90 Base) MCG/ACT inhaler Inhale 2 puffs into the lungs every 6 (six) hours as needed for wheezing or shortness of breath. 18 g 11 05/23/2021  ? amLODipine (NORVASC) 2.5 MG tablet Take 1 tablet (2.5 mg total) by mouth daily. (Patient taking differently: Take 5 mg by mouth 2 (two) times daily.) 90 tablet 3 05/23/2021  ? amoxicillin-clavulanate (AUGMENTIN) 875-125 MG tablet Take 1 tablet by mouth 2 (two) times daily. With food x 7-10 days 20 tablet 0 05/22/2021  ? budesonide (PULMICORT) 0.25 MG/2ML nebulizer solution USE 1 VIAL  IN  NEBULIZER TWICE  DAILY - rinse mouth after treatment 60 mL 11 05/23/2021  ? Cholecalciferol (VITAMIN D3) 50 MCG (2000 UT) TABS Take 4,000 Units by mouth daily.   05/22/2021  ? clopidogrel (PLAVIX) 75 MG tablet Take 75 mg by mouth daily.   05/16/2021  ? cyclobenzaprine (FLEXERIL) 10 MG tablet Take 1 tablet (10 mg total) by  mouth 3 (three) times daily as needed for muscle spasms. 30 tablet 0 05/22/2021  ? dicyclomine (BENTYL) 10 MG capsule Take 1 capsule (10 mg total) by mouth 2 (two) times daily as needed for spasms. (Patient taking differently: Take 20 mg by mouth 2 (two) times daily.) 180 capsule 3 05/23/2021  ? erythromycin ophthalmic ointment Place 1 application. into both eyes 3 (three) times daily. X4-7 days 3.5 g 0 05/22/2021  ? escitalopram (LEXAPRO) 10 MG tablet TAKE 1 TABLET BY MOUTH EVERY DAY 90 tablet 1 05/22/2021  ? famotidine (PEPCID) 20 MG tablet TAKE 1 TABLET (20 MG TOTAL) BY MOUTH DAILY. BEFORE BREAKFAST OR DINNER 90 tablet 1 05/23/2021  ? fluconazole (DIFLUCAN) 150 MG tablet TAKE 1 TABLET (150 MG TOTAL) BY MOUTH ONCE FOR 1 DOSE. REPEAT 2ND DOSE IN 3-7 DAYS AS NEEDED 3 tablet 0 05/22/2021  ? fluticasone (FLONASE) 50 MCG/ACT nasal spray Place 2 sprays into both nostrils daily. In am   05/22/2021  ? gabapentin (NEURONTIN) 300 MG capsule Take 2 capsules (600 mg total) by mouth  in the morning and at bedtime. 360 capsule 3 05/23/2021  ? ipratropium-albuterol (DUONEB) 0.5-2.5 (3) MG/3ML SOLN TAKE 3 MLS BY NEBULIZATION 3 (THREE) TIMES DAILY AS NEEDED. (Patient taking different

## 2021-05-30 NOTE — Progress Notes (Signed)
? ?   Attending Progress Note ? ?History: Kerri Carter is here for adjacent segment segment disease with spondylolisthesis in the lumbar spine as well as lumbar radiculopathy. ? ?POD7: continues to do well from a spine standpoint. Denies pain this morning ? ?POD5: Doing well from a spine standpoint this afternoon. Reports improved leg and back pain ? ?POD4: transferred to ICU yesterday. ? ?POD3: suffered from hypoxia overnight was increased O2 requirement. Hosptialist consulted for assistance.  ? ?POD2: Doing well with continued back pain.   ? ?POD1: Expected post-op back pain.  Her legs feel better. ? ?Physical Exam: ?Vitals:  ? 05/30/21 0400 05/30/21 0700  ?BP: (!) 141/68 124/65  ?Pulse: 64 61  ?Resp: (!) 9 10  ?Temp: 98.4 ?F (36.9 ?C)   ?SpO2: 98% 98%  ? ? ?AA Ox2 ?CNI ? ?Strength:5/5 throughout BLE ? ?Data: ? ?Recent Labs  ?Lab 05/28/21 ?0501 05/29/21 ?0418 05/30/21 ?0429  ?NA 136 138 140  ?K 4.8 4.1 3.9  ?CL 100 100 101  ?CO2 '26 29 30  '$ ?BUN 51* 52* 44*  ?CREATININE 1.54* 1.35* 1.08*  ?GLUCOSE 174* 176* 125*  ?CALCIUM 8.0* 8.0* 8.2*  ? ? ?No results for input(s): AST, ALT, ALKPHOS in the last 168 hours. ? ?Invalid input(s): TBILI  ? Recent Labs  ?Lab 05/28/21 ?0501 05/29/21 ?0418 05/30/21 ?0429  ?WBC 14.4* 13.7* 12.0*  ?HGB 9.3* 8.9* 9.0*  ?HCT 28.6* 28.3* 29.3*  ?PLT 251 277 279  ? ? ?No results for input(s): APTT, INR in the last 168 hours.  ?   ? ? ?Other tests/results: n/a ? ?Assessment/Plan: ? ?Kerri Carter is doing well after extension of lumbar fusion to L2. ? ?- mobilize ?- pain control ?- pt cleared for DVT ppx from neurosurgical standpoint  ?- PTOT ?- appreciate medicines assistance with medical management.  ? ?Cooper Render  ?Department of Neurosurgery ? ? ? ?

## 2021-05-30 NOTE — Progress Notes (Signed)
Physical Therapy Treatment ?Patient Details ?Name: Kerri Carter ?MRN: [id number] ?DOB: [birth year] ?Today's Date: 05/30/2021 ? ? ?History of Present Illness Pt is a 81 y/o F admitted to hospital who presents with spondylithesis and lumbar radiculopathy and has failed conservative management. Pt is now s/p L 3/4 & 2/3 fusion with posterolateral arthrodesis from L2 to L5.  Pt noted with acute hypoxia with dyspnea 4/24 and transferred to ICU (pt with acute respiratory failure with hypoxia, L LL PNA, acute on chronic combined systolic and diastolic CHF, AKI superimposed on CKD, and s/p lumbar fusion). PMH: anxiety, aortic stenosis, asthma, CKD, CVA, depression, GERD/PUD, retention, interstitial lung disease, PAD, bipolar disorder, conversion disorder, heart murmur, HLD. ? ?  ?PT Comments  ? ? Pt was long sitting in bed upon arriving. She agrees to session and is cooperative throughout. Continues to present with anxiety with all mobility and OOB activity with elevates RR. HR and sao2 > 94% throughout. Pt is progressing however slowly. She needs constant relaxation technique and vcs for more activity. " Ive been doing my exercises." Pt is severely debilitated and will require extensive PT to return to PLOF. Recommend DC to SNF to address these deficits while maximizing independence with all ADLs.  ?  ?Recommendations for follow up therapy are one component of a multi-disciplinary discharge planning process, led by the attending physician.  Recommendations may be updated based on patient status, additional functional criteria and insurance authorization. ? ?Follow Up Recommendations ? Skilled nursing-short term rehab (<3 hours/day) ?  ?  ?Assistance Recommended at Discharge Frequent or constant Supervision/Assistance  ?Patient can return home with the following A lot of help with walking and/or transfers;A lot of help with bathing/dressing/bathroom;Direct supervision/assist for financial management;Assistance with  cooking/housework;Help with stairs or ramp for entrance;Assist for transportation ?  ?Equipment Recommendations ? Other (comment) (defer to next level of care)  ?  ?   ?Precautions / Restrictions Precautions ?Precautions: Fall;Back ?Precaution Booklet Issued: No ?Required Braces or Orthoses: Spinal Brace ?Spinal Brace: Thoracolumbosacral orthotic;Applied in sitting position ?Restrictions ?Weight Bearing Restrictions: No  ?  ? ?Mobility ? Bed Mobility ?Overal bed mobility: Needs Assistance ?Bed Mobility: Supine to Sit, Rolling, Sidelying to Sit ?Rolling: Min assist ?Sidelying to sit: Min assist ?Supine to sit: Mod assist ?  ?  ?General bed mobility comments: increased time + vcs for technique and sequencing ?  ? ?Transfers ?Overall transfer level: Needs assistance ?Equipment used: Rolling walker (2 wheels) ?Transfers: Sit to/from Stand ?Sit to Stand: Min guard, From elevated surface ?  ?  ?  ?  ?  ?General transfer comment: Pt was able to stand from elevated bed height with CGA for safety. She required vcs for handplacement and technique improvements ?  ? ?Ambulation/Gait ?Ambulation/Gait assistance: Min assist ?Gait Distance (Feet): 8 Feet ?Assistive device: Rolling walker (2 wheels) ?Gait Pattern/deviations: Step-to pattern ?Gait velocity: decreased ?  ?  ?General Gait Details: pt continues to have SOB with very limited activity. Author continues to feel anxiety is limiting pt's progress. Vitals were stable throughout with RR/ sao2 stable ? ?  ?Balance Overall balance assessment: Needs assistance ?Sitting-balance support: Feet supported, Bilateral upper extremity supported, Single extremity supported ?Sitting balance-Leahy Scale: Fair ?  ?  ?Standing balance support: Reliant on assistive device for balance, Bilateral upper extremity supported, During functional activity ?Standing balance-Leahy Scale: Fair ?  ?   ?Cognition Arousal/Alertness: Awake/alert ?Behavior During Therapy: Anxious ?Overall Cognitive Status:  Within Functional Limits for tasks assessed ?  ?   ?  General Comments: Anxious, SOB with limited exertion, however VSS with SpO2 >93% ?  ?  ? ?  ? ?Pertinent Vitals/Pain Pain Assessment ?Pain Assessment: No/denies pain ?Pain Score: 0-No pain  ? ? ? ?PT Goals (current goals can now be found in the care plan section) Acute Rehab PT Goals ?Patient Stated Goal: rehab then home ?Progress towards PT goals: Progressing toward goals ? ?  ?Frequency ? ? ? 7X/week ? ? ? ?  ?PT Plan Current plan remains appropriate  ? ? ?Co-evaluation   ?  ?PT goals addressed during session: Mobility/safety with mobility;Balance;Proper use of DME;Strengthening/ROM ?  ?  ? ?  ?AM-PAC PT "6 Clicks" Mobility   ?Outcome Measure ? Help needed turning from your back to your side while in a flat bed without using bedrails?: A Little ?Help needed moving from lying on your back to sitting on the side of a flat bed without using bedrails?: A Lot ?Help needed moving to and from a bed to a chair (including a wheelchair)?: A Lot ?Help needed standing up from a chair using your arms (e.g., wheelchair or bedside chair)?: A Lot ?Help needed to walk in hospital room?: A Little ?Help needed climbing 3-5 steps with a railing? : A Lot ?6 Click Score: 14 ? ?  ?End of Session Equipment Utilized During Treatment: Oxygen ?Activity Tolerance: Patient tolerated treatment well;Patient limited by fatigue ?Patient left: in chair;with call bell/phone within reach;with chair alarm set ?Nurse Communication: Mobility status;Precautions ?PT Visit Diagnosis: Unsteadiness on feet (R26.81);Pain;Muscle weakness (generalized) (M62.81);Difficulty in walking, not elsewhere classified (R26.2);History of falling (Z91.81);Repeated falls (R29.6) ?  ? ? ?Time: 6195-0932 ?PT Time Calculation (min) (ACUTE ONLY): 12 min ? ?Charges:  $Therapeutic Activity: 8-22 mins  ?         ?Julaine Fusi PTA ?05/30/21, 1:32 PM  ? ?

## 2021-05-30 NOTE — Progress Notes (Addendum)
?[facility] CARDIOLOGY CONSULT NOTE  ? ?    ?Patient ID: ?Kerri Carter ?MRN: [id number] ?DOB/AGE: 79-Dec-1942 79 y.o. ? ?Admit date: 05/23/2021 ?Referring Physician Dr. Eugenie Norrie ?Primary Physician Dr. Olivia Mackie McLean-Scocuzza ?Primary Cardiologist Laqueta Jean, PA-C ?Reason for Consultation acute CHF ? ?HPI: Kerri Carter is a 81 year old female with a PMH of HFpEF (LVEF 50%, g1DD 05/16/21), low flow/low gradient AS, COPD on 2L of O2 chronically, nonobstructive CAD by heart cath 03/2011, hypertension, hyperlipidemia, PAD, CKD3 with history of R nephrectomy 1988, depression, who presented to [facility] for a lumbar fusion extension on 05/23/21 with Dr. [name]. On POD3 (4/24) she became hypoxic in the early morning with associated shortness of breath, with an increased oxygen requirement from HFNC to BiPAP. Cardiology is consulted for further assistance.  ? ?Interval History: ?-Oxygen weaning down from 10L HFNC to 5L HFNC today ?-back in and out of atrial fibrillation and sinus rhythm, rate controlled in the 70s-80s ?-overall feels better today but remains dyspneic with productive cough with blood tinged sputum. No chest pain.  ? ?Review of systems complete and found to be negative unless listed above  ? ?Past Medical History:  ?Diagnosis Date  ? Anxiety   ? Aortic stenosis 07/09/2015  ? a.) TTE 07/06/2015: EF 55-60%; mild AS with MPG 13.0 mmHg.  ? Asthma   ? Bipolar disorder ([place])   ? C. difficile diarrhea 2010  ? a.) following ABX course during hospital admission  ? Carotid atherosclerosis, bilateral   ? a.) Moderate; < 50% stenosis BILATERAL ICAs.  ? Cataract   ? a.) s/p BILATERAL extraction  ? Chicken pox   ? CKD (chronic kidney disease), stage III ([place])   ? a. s/p R nephrectomy./ aneurysm  ? Conversion disorder   ? COPD (chronic obstructive pulmonary disease) ([place])   ? Depression   ? Essential hypertension   ? GERD (gastroesophageal reflux disease)   ? Heart murmur   ? Hyperlipidemia   ? ILD (interstitial lung disease)  ([place])   ? mild; 2/2 RA diagnosis  ? Inflammatory arthritis   ? a. hands/carpal tunnel.  b. Low titer rheumatoid factor. c. Negative anti-CCP antibodies. d. Plaquenil.  ? Macular degeneration   ? Nocturnal hypoxemia   ? Non-Obstructive CAD   ? a. 07/2009 Cath ([facility]): nonobs dzs;  b. 03/[facility]): nonobs dzs.  ? Osteoarthritis   ? a. Knees.  ? PAD (peripheral artery disease) ([place])   ? PUD (peptic ulcer disease)   ? S/P right hip fracture   ? 11/01/16 s/p repair  ? Shoulder pain   ? Sleep apnea   ? no cpap / minimal  ? Spinal stenosis at L4-L5 level   ? severe with L4/L5 anterolisthesis grade 1 anterolisthesis   ? Toxic maculopathy   ? Valvular heart disease   ? a.) TTE 07/06/2015: EF 55-60%; Mild MR/AR/TR; Mild AS with MPG 13.0 mmHg.  ?  ?Past Surgical History:  ?Procedure Laterality Date  ? APPENDECTOMY    ? APPLICATION OF INTRAOPERATIVE CT SCAN N/A 05/23/2021  ? Procedure: APPLICATION OF INTRAOPERATIVE CT SCAN;  Surgeon: Meade Maw, MD;  Location: [facility] ORS;  Service: Neurosurgery;  Laterality: N/A;  ? BACK SURGERY    ? lumbar fusion  ? BUNIONECTOMY Right   ? CATARACT EXTRACTION, BILATERAL    ? CESAREAN SECTION    ? x1  ? CHOLECYSTECTOMY N/A 05/11/2016  ? Procedure: LAPAROSCOPIC CHOLECYSTECTOMY;  Surgeon: Florene Glen, MD;  Location: [facility] ORS;  Service: General;  Laterality:  N/A;  ? COLONOSCOPY WITH PROPOFOL N/A 04/02/2016  ? Procedure: COLONOSCOPY WITH PROPOFOL;  Surgeon: Jonathon Bellows, MD;  Location[facility] END[facility]COPY;  Service: Endoscopy;  Laterality: N/A;  ? ENDOSCOPIC RETROGRADE CHOLANGIOPANCREATOGRAPHY (ERCP) WITH PROPOFOL N/A 05/08/2016  ? Procedure: ENDOSCOPIC RETROGRADE CHOLANGIOPANCREATOGRAPHY (ERCP) WITH PROPOFOL;  Surgeon: Lucilla Lame, MD;  Location: [facility] ENDOSCOPY;  Service: Endoscopy;  Laterality: N/A;  ? ERCP    ? with biliary spincterotomy 05/08/16 Dr. Allen Norris for choledocholithiasis   ? ESOPHAGEAL DILATION  04/02/2016  ? Procedure: ESOPHAGEAL DILATION;  Surgeon: Jonathon Bellows, MD;  Location: [facility] ENDOSCOPY;   Service: Endoscopy;;  ? ESOPHAGOGASTRODUODENOSCOPY (EGD) WITH PROPOFOL N/A 04/02/2016  ? Procedure: ESOPHAGOGASTRODUODENOSCOPY (EGD) WITH PROPOFOL;  Surgeon: Jonathon Bellows, MD;  Location: [facility] ENDOSCOPY;  Service: Endoscopy;  Laterality: N/A;  ? HIP ARTHROPLASTY Right 11/01/2016  ? Procedure: ARTHROPLASTY BIPOLAR HIP (HEMIARTHROPLASTY);  Surgeon: Corky Mull, MD;  Location: [facility] ORS;  Service: Orthopedics;  Laterality: Right;  ? LUMBAR LAMINECTOMY/DECOMPRESSION MICRODISCECTOMY N/A 12/11/2020  ? Procedure: LEFT L2-3 MICRODISCECTOMY, L3-4 AND L5-S1 DECOMPRESSION;  Surgeon: Meade Maw, MD;  Location: [facility] ORS;  Service: Neurosurgery;  Laterality: N/A;  ? NEPHRECTOMY  1988  ? 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 ORIF SCAPHOID FRACTURE Left 12/21/2019  ? Procedure: OPEN REDUCTION INTERNAL FIXATION (ORIF) OF LEFT SCAPULAR NONUNION WITH BONE GRAFT;  Surgeon: Corky Mull, MD;  Location: [facility] ORS;  Service: Orthopedics;  Laterality: Left;  ? osteoporosis    ? noted DEXA 08/19/16   ? REPLACEMENT TOTAL KNEE Right   ? REVERSE SHOULDER ARTHROPLASTY Right 11/04/2017  ? Procedure: REVERSE SHOULDER ARTHROPLASTY;  Surgeon: Corky Mull, MD;  Location: [facility] ORS;  Service: Orthopedics;  Laterality: Right;  ? REVERSE SHOULDER ARTHROPLASTY Left 07/26/2018  ? Procedure: REVERSE SHOULDER ARTHROPLASTY;  Surgeon: Corky Mull, MD;  Location: [facility] ORS;  Service: Orthopedics;  Laterality: Left;  ? TONSILLECTOMY    ? TOTAL HIP ARTHROPLASTY  12/10/11  ? [place] left hip  ? TOTAL HIP ARTHROPLASTY Bilateral   ? TRANSFORAMINAL LUMBAR INTERBODY FUSION (TLIF) WITH PEDICLE SCREW FIXATION 3 LEVEL N/A 05/23/2021  ? Procedure: OPEN L2-4 TRANSFORAMINAL LUMBAR INTERBODY FUSION (TLIF) WITH L2-5 POSTERIOR SPINAL FUSION;  Surgeon: Meade Maw, MD;  Location: [facility] ORS;  Service: Neurosurgery;  Laterality: N/A;  ? TUBAL LIGATION    ?  ?Medications Prior to Admission  ?Medication Sig Dispense Refill Last Dose  ? Adalimumab  40 MG/0.4ML PNKT Inject 40 mg into the skin every 14 (fourteen) days.   Past Month  ? albuterol (VENTOLIN HFA) 108 (90 Base) MCG/ACT inhaler Inhale 2 puffs into the lungs every 6 (six) hours as needed for wheezing or shortness of breath. 18 g 11 05/23/2021  ? amLODipine (NORVASC) 2.5 MG tablet Take 1 tablet (2.5 mg total) by mouth daily. (Patient taking differently: Take 5 mg by mouth 2 (two) times daily.) 90 tablet 3 05/23/2021  ? amoxicillin-clavulanate (AUGMENTIN) 875-125 MG tablet Take 1 tablet by mouth 2 (two) times daily. With food x 7-10 days 20 tablet 0 05/22/2021  ? budesonide (PULMICORT) 0.25 MG/2ML nebulizer solution USE 1 VIAL  IN  NEBULIZER TWICE  DAILY - rinse mouth after treatment 60 mL 11 05/23/2021  ? Cholecalciferol (VITAMIN D3) 50 MCG (2000 UT) TABS Take 4,000 Units by mouth daily.   05/22/2021  ? clopidogrel (PLAVIX) 75 MG tablet Take 75 mg by mouth daily.   05/16/2021  ? cyclobenzaprine (FLEXERIL) 10 MG tablet Take 1 tablet (10 mg total) by  mouth 3 (three) times daily as needed for muscle spasms. 30 tablet 0 05/22/2021  ? dicyclomine (BENTYL) 10 MG capsule Take 1 capsule (10 mg total) by mouth 2 (two) times daily as needed for spasms. (Patient taking differently: Take 20 mg by mouth 2 (two) times daily.) 180 capsule 3 05/23/2021  ? erythromycin ophthalmic ointment Place 1 application. into both eyes 3 (three) times daily. X4-7 days 3.5 g 0 05/22/2021  ? escitalopram (LEXAPRO) 10 MG tablet TAKE 1 TABLET BY MOUTH EVERY DAY 90 tablet 1 05/22/2021  ? famotidine (PEPCID) 20 MG tablet TAKE 1 TABLET (20 MG TOTAL) BY MOUTH DAILY. BEFORE BREAKFAST OR DINNER 90 tablet 1 05/23/2021  ? fluconazole (DIFLUCAN) 150 MG tablet TAKE 1 TABLET (150 MG TOTAL) BY MOUTH ONCE FOR 1 DOSE. REPEAT 2ND DOSE IN 3-7 DAYS AS NEEDED 3 tablet 0 05/22/2021  ? fluticasone (FLONASE) 50 MCG/ACT nasal spray Place 2 sprays into both nostrils daily. In am   05/22/2021  ? gabapentin (NEURONTIN) 300 MG capsule Take 2 capsules (600 mg total) by mouth  in the morning and at bedtime. 360 capsule 3 05/23/2021  ? ipratropium-albuterol (DUONEB) 0.5-2.5 (3) MG/3ML SOLN TAKE 3 MLS BY NEBULIZATION 3 (THREE) TIMES DAILY AS NEEDED. (Patient taking different

## 2021-05-31 DIAGNOSIS — N179 Acute kidney failure, unspecified: Secondary | ICD-10-CM | POA: Diagnosis not present

## 2021-05-31 DIAGNOSIS — I5043 Acute on chronic combined systolic (congestive) and diastolic (congestive) heart failure: Secondary | ICD-10-CM | POA: Diagnosis not present

## 2021-05-31 DIAGNOSIS — J9601 Acute respiratory failure with hypoxia: Secondary | ICD-10-CM | POA: Diagnosis not present

## 2021-05-31 DIAGNOSIS — J441 Chronic obstructive pulmonary disease with (acute) exacerbation: Secondary | ICD-10-CM | POA: Diagnosis not present

## 2021-05-31 LAB — CBC
HCT: 28.2 % — ABNORMAL LOW (ref 36.0–46.0)
Hemoglobin: 8.7 g/dL — ABNORMAL LOW (ref 12.0–15.0)
MCH: 26.2 pg (ref 26.0–34.0)
MCHC: 30.9 g/dL (ref 30.0–36.0)
MCV: 84.9 fL (ref 80.0–100.0)
Platelets: 322 10*3/uL (ref 150–400)
RBC: 3.32 MIL/uL — ABNORMAL LOW (ref 3.87–5.11)
RDW: 14.6 % (ref 11.5–15.5)
WBC: 12.8 10*3/uL — ABNORMAL HIGH (ref 4.0–10.5)
nRBC: 0.2 % (ref 0.0–0.2)

## 2021-05-31 LAB — CULTURE, BLOOD (ROUTINE X 2)
Culture: NO GROWTH
Culture: NO GROWTH
Special Requests: ADEQUATE

## 2021-05-31 LAB — BASIC METABOLIC PANEL
Anion gap: 8 (ref 5–15)
BUN: 33 mg/dL — ABNORMAL HIGH (ref 8–23)
CO2: 32 mmol/L (ref 22–32)
Calcium: 8.1 mg/dL — ABNORMAL LOW (ref 8.9–10.3)
Chloride: 98 mmol/L (ref 98–111)
Creatinine, Ser: 1.09 mg/dL — ABNORMAL HIGH (ref 0.44–1.00)
GFR, Estimated: 52 mL/min — ABNORMAL LOW (ref >60)
Glucose, Bld: 113 mg/dL — ABNORMAL HIGH (ref 70–99)
Potassium: 3.7 mmol/L (ref 3.5–5.1)
Sodium: 138 mmol/L (ref 135–145)

## 2021-05-31 LAB — GLUCOSE, CAPILLARY
Glucose-Capillary: 90 mg/dL (ref 70–99)
Glucose-Capillary: 176 mg/dL — ABNORMAL HIGH (ref 70–99)
Glucose-Capillary: 214 mg/dL — ABNORMAL HIGH (ref 70–99)
Glucose-Capillary: 210 mg/dL — ABNORMAL HIGH (ref 70–99)

## 2021-05-31 LAB — CULTURE, RESPIRATORY W GRAM STAIN
Culture: NORMAL
Gram Stain: NONE SEEN

## 2021-05-31 MED ORDER — FUROSEMIDE 40 MG PO TABS
40.0000 mg | ORAL_TABLET | Freq: Every day | ORAL | Status: DC
  Administered 2021-06-01 – 2021-06-02 (×2): 40 mg via ORAL
  Filled 2021-05-31 (×2): qty 1

## 2021-05-31 NOTE — TOC Progression Note (Addendum)
Transition of Care (TOC) - Progression Note  ? ? ?Patient Details  ?Name: Kerri Carter ?MRN: [id number] ?Date of Birth: 03/28/1940 ? ?Transition of Care (TOC) CM/SW Contact  ?Izola Price, RN ?Phone Number: ?05/31/2021, 5:50 PM ? ?Clinical Narrative: 4/29: Spoke with patient regarding SNF/STR update per request by unit staff. Updated her on [facility] acceptance and insurance authorization process. Gave patient RN CM number if other questions over the weekend. Simmie Davies RN CM  ? ?[id number] [phone].  ? ?Expected Discharge Plan: [place] ?Barriers to Discharge: Continued Medical Work up ? ?Expected Discharge Plan and Services ?Expected Discharge Plan: [place] ?  ?  ?  ?Living arrangements for the past 2 months: [place] ?                ?  ?  ?  ?  ?  ?  ?  ?  ?  ?  ? ? ?Social Determinants of Health (SDOH) Interventions ?  ? ?Readmission Risk Interventions ? ?  05/24/2021  ?  2:35 PM  ?Readmission Risk Prevention Plan  ?Transportation Screening Complete  ?PCP or Specialist Appt within 3-5 Days Complete  ?[place] or Home Care Consult Complete  ?Social Work Consult for [place] Planning/Counseling Complete  ?Palliative Care Screening Not Applicable  ?Medication Review Press photographer) Complete  ? ? ?

## 2021-05-31 NOTE — Progress Notes (Signed)
Physical Therapy Treatment ?Patient Details ?Name: Kerri Carter ?MRN: [id number] ?DOB: March 01, 1940 ?Today's Date: 05/31/2021 ? ? ?History of Present Illness Pt is a 81 y/o F admitted to hospital who presents with spondylithesis and lumbar radiculopathy and has failed conservative management. Pt is now s/p L 3/4 & 2/3 fusion with posterolateral arthrodesis from L2 to L5.  Pt noted with acute hypoxia with dyspnea 4/24 and transferred to ICU (pt with acute respiratory failure with hypoxia, L LL PNA, acute on chronic combined systolic and diastolic CHF, AKI superimposed on CKD, and s/p lumbar fusion). PMH: anxiety, aortic stenosis, asthma, CKD, CVA, depression, GERD/PUD, retention, interstitial lung disease, PAD, bipolar disorder, conversion disorder, heart murmur, HLD. ? ?  ?PT Comments  ? ? Pt was sitting in recliner on 2 L hfnc upon arriving. She is A an O but endorsing severe fatigue from ambulation in room with RN tech not long ago. She agrees o participate however is somewhat self limiting. Fatigue most limiting this session. She stood and took a few steps in room but has incontinence episode (urine) and requested to return to sitting." I am just all warn out. I promise to do my exercises later." Pt requested stopping session to rest. PT continues to recommend DC to SNF to address deficits while maximizing independence with all ADLs.  ?  ?Recommendations for follow up therapy are one component of a multi-disciplinary discharge planning process, led by the attending physician.  Recommendations may be updated based on patient status, additional functional criteria and insurance authorization. ? ?Follow Up Recommendations ? Skilled nursing-short term rehab (<3 hours/day) ?  ?  ?Assistance Recommended at Discharge Frequent or constant Supervision/Assistance  ?Patient can return home with the following A little help with walking and/or transfers;A little help with bathing/dressing/bathroom;Assistance with  cooking/housework;Assistance with feeding;Direct supervision/assist for medications management;Direct supervision/assist for financial management;Assist for transportation;Help with stairs or ramp for entrance ?  ?Equipment Recommendations ? Other (comment) (defer to next level of care)  ?  ?   ?Precautions / Restrictions Precautions ?Precautions: Fall;Back ?Precaution Booklet Issued: No ?Required Braces or Orthoses: Spinal Brace ?Spinal Brace: Thoracolumbosacral orthotic;Applied in sitting position ?Restrictions ?Weight Bearing Restrictions: No  ?  ? ?Mobility ? Bed Mobility ? General bed mobility comments: in recliner/pre/post session ?  ? ?Transfers ?Overall transfer level: Needs assistance ?Equipment used: Rolling walker (2 wheels) ?Transfers: Sit to/from Stand ?Sit to Stand: Min guard ?  ?  ?  ?  ?  ?General transfer comment: pt stood 2 x from recliner however on 2nd STS has incontinence episode and requested to stop session ?  ? ?Ambulation/Gait ?Ambulation/Gait assistance: Min guard, Min assist ?Gait Distance (Feet): 3 Feet ?Assistive device: Rolling walker (2 wheels) ?Gait Pattern/deviations: Step-to pattern ?Gait velocity: decreased ?  ?  ?General Gait Details: pt was able to advance to taking a few steps awayy from recliner however quickly endorses severe fatigue + has incontinence episode (urine). she requested to rest after clean up due to fatigue ? ?  ?Balance Overall balance assessment: Needs assistance ?Sitting-balance support: Feet supported, Bilateral upper extremity supported, Single extremity supported ?Sitting balance-Leahy Scale: Good ?  ?  ?Standing balance support: Reliant on assistive device for balance, Bilateral upper extremity supported, During functional activity ?Standing balance-Leahy Scale: Fair ?  ?   ? ?  ?Cognition Arousal/Alertness: Awake/alert ?Behavior During Therapy: [facility] for tasks assessed/performed ?Overall Cognitive Status: Within Functional Limits for tasks assessed ?  ?  ?  General Comments: pt is somewhat self limiting.  Does have high anxiety about mobility but overall continues to improve. limited session due to pt just finsihing ambulation in room with RN tech. ?  ?  ? ?  ?   ?   ? ?Pertinent Vitals/Pain Pain Assessment ?Pain Assessment: No/denies pain  ? ? ? ?PT Goals (current goals can now be found in the care plan section) Acute Rehab PT Goals ?Patient Stated Goal: rehab then home ?Progress towards PT goals: Progressing toward goals ? ?  ?Frequency ? ? ? 7X/week ? ? ? ?  ?PT Plan Current plan remains appropriate  ? ? ?Co-evaluation   ?  ?PT goals addressed during session: Strengthening/ROM;Proper use of DME;Balance;Mobility/safety with mobility ?  ?  ? ?  ?AM-PAC PT "6 Clicks" Mobility   ?Outcome Measure ? Help needed turning from your back to your side while in a flat bed without using bedrails?: A Little ?Help needed moving from lying on your back to sitting on the side of a flat bed without using bedrails?: A Lot ?Help needed moving to and from a bed to a chair (including a wheelchair)?: A Lot ?Help needed standing up from a chair using your arms (e.g., wheelchair or bedside chair)?: A Little ?Help needed to walk in hospital room?: A Little ?Help needed climbing 3-5 steps with a railing? : A Lot ?6 Click Score: 15 ? ?  ?End of Session Equipment Utilized During Treatment: Oxygen (2 L HFNC throughout) ?Activity Tolerance: Patient limited by fatigue ?Patient left: in chair;with call bell/phone within reach;with chair alarm set ?Nurse Communication: Mobility status;Precautions ?PT Visit Diagnosis: Unsteadiness on feet (R26.81);Pain;Muscle weakness (generalized) (M62.81);Difficulty in walking, not elsewhere classified (R26.2);History of falling (Z91.81);Repeated falls (R29.6) ?  ? ? ?Time: 1010-1025 ?PT Time Calculation (min) (ACUTE ONLY): 15 min ? ?Charges:  $Therapeutic Activity: 8-22 mins          ?          ? ?Julaine Fusi PTA ?05/31/21, 11:47 AM  ? ?

## 2021-05-31 NOTE — Progress Notes (Signed)
? ? ? ?Progress Note  ? ? ?Kerri Carter  UKG:[id number] DOB: Mar 09, 1940  DOA: 05/23/2021 ?PCP: McLean-Scocuzza, Nino Glow, MD  ? ? ? ? ?Brief Narrative:  ? ? ?Medical records reviewed and are as summarized below: ? ?Kerri Carter is a 81 y.o. female with medical history significant for COPD, asthma, bipolar disorder, depression, hypertension, stage III chronic kidney disease,  dyslipidemia, GERD, PAD, peptic ulcer disease, s/p lumbar fusion, new disc herniation at L3-L4 with severe stenosis.  She presented to the hospital for transforaminal lumbar interbody fusion L3/4 and 2/3 and posterolateral arthrodesis L2-L5 on 05/23/2021. ? ?She developed acute hypoxic respiratory failure from pneumonia and acute exacerbation of chronic diastolic CHF.  She required BiPAP and oxygen via high flow nasal cannula for hypoxia.  She was treated with empiric IV antibiotics and IV Lasix.  She developed paroxysmal atrial fibrillation and cardiologist was consulted to assist with management.  She developed AKI likely from IV diuresis. ? ? ?As per NP Meda Coffee (ICU): ?4/21: Underwent elective lumbar fusion with Dr. [name] ?4/22: Declining DVT prophylaxis (Lovenox). ?4/24: Acute Hypoxia and Respiratory Distress requiring BiPAP.  Hospitalist and PCCM consulted.  Aggressive diuresis per Cardiology, empirically covering for pneumonia.  Rule out DVT/PE. ?4/24: NM Pulmonary Perfusion pulmonary embolism absent ?4/24: Echo revealed EF 50 to 55%, left grade II diastolic dysfunction (pseudonormalization) ?4/25: Pt stable on 15L HFNC, PCCM will sign off  ? ? ? ?Assessment/Plan:  ? ?Principal Problem: ?  Acute respiratory failure with hypoxia ([place]) ?Active Problems: ?  Left lower lobe pneumonia ?  Acute on chronic combined systolic and diastolic CHF (congestive heart failure) ([place]) ?  S/P lumbar fusion ?  Acute kidney injury superimposed on chronic kidney disease ([place]) ?  Chronic obstructive pulmonary disease (COPD) ([place]) ?  Peripheral neuropathy ?   Elevated d-dimer ?  Hyperkalemia ?  Anxiety and depression ? ? ?Body mass index is 33.32 kg/m?.  (Obesity) ? ?Acute on chronic hypoxic respiratory failure: She is down to 2 L/min oxygen.  She uses 2 L/min oxygen at night for OSA. ? ?Left lower lobe pneumonia, suspected aspiration: Repeat chest x-ray on 05/29/2021 showed persistent dense left perihilar airspace opacity but slightly improved compared to previous study.  Completed Augmentin and azithromycin on 05/30/2021. ? ?Acute on chronic diastolic CHF, moderate to severe aortic stenosis, severe mitral regurgitation: Change IV to oral Lasix ? ?AKI on CKD stage IIIa: Creatinine is stable.  ? ?Paroxysmal atrial fibrillation, new onset: Continue nebivolol.  CHA2DS2-VASc score is 4.  Cardiologist wants to wait until 14 days before considering long-term anticoagulation.  Earliest date for anticoagulation will be 06/07/2021. ? ?COPD exacerbation: Continue prednisone for 1 more day.  Continue bronchodilators. ? ?S/p lumbar fusion to L2 on 05/23/2021: Continue PT and OT.  Analgesics as needed for pain.  Outpatient follow-up with neurosurgeon. ? ?Type II DM with hyperglycemia: Hemoglobin A1c was 6.0.  Use NovoLog as needed for hyperglycemia. ? ?Generalized weakness, s/p transforaminal lumbar interbody fusion L3/4 and 2/3, posterolateral arthrodesis L2-L5 on 05/23/2021: Analgesics as needed for pain.  Continue PT and OT.  Discharge to SNF recommended. ? ?Continue BuSpar for anxiety ? ?Other comorbidities include anxiety, depression, anemia of chronic disease, OSA on home oxygen at night ? ?Diet Order   ? ?       ?  Diet Heart Room service appropriate? Yes; Fluid consistency: Thin  Diet effective now       ?  ? ?  ?  ? ?  ? ? ? ? ? ? ? ? ? ? ? ?  Consultants: ?Intensivist ?Cardiologist ? ?Procedures: ?Back surgery on 05/23/2021 ? ? ? ?Medications:  ? ? acetaminophen  1,000 mg Oral Q6H  ? amLODipine  5 mg Oral Daily  ? budesonide  0.25 mg Nebulization BID  ? busPIRone  5 mg Oral BID  ?  Chlorhexidine Gluconate Cloth  6 each Topical Daily  ? Chlorhexidine Gluconate Cloth  6 each Topical Q0600  ? cholecalciferol  4,000 Units Oral Daily  ? dicyclomine  20 mg Oral BID  ? docusate sodium  100 mg Oral BID  ? enoxaparin (LOVENOX) injection  40 mg Subcutaneous Q24H  ? escitalopram  10 mg Oral Daily  ? famotidine  20 mg Oral Daily  ? fluticasone  2 spray Each Nare Daily  ? [START ON 06/01/2021] furosemide  40 mg Oral Daily  ? gabapentin  600 mg Oral BID  ? guaiFENesin  600 mg Oral BID  ? insulin aspart  0-5 Units Subcutaneous QHS  ? insulin aspart  0-9 Units Subcutaneous TID WC  ? ipratropium-albuterol  3 mL Nebulization BID  ? lamoTRIgine  100 mg Oral BID  ? leflunomide  20 mg Oral Daily  ? lidocaine  1 patch Transdermal Daily  ? methocarbamol  500 mg Oral Q6H  ? montelukast  10 mg Oral Daily  ? multivitamin-lutein  1 capsule Oral Daily  ? nebivolol  5 mg Oral Daily  ? predniSONE  20 mg Oral Q breakfast  ? QUEtiapine  25 mg Oral QHS  ? senna  1 tablet Oral BID  ? sodium chloride flush  3 mL Intravenous Q12H  ? sucralfate  1 g Oral BID  ? ?Continuous Infusions: ? sodium chloride    ? methocarbamol (ROBAXIN) IV Stopped (05/28/21 0237)  ? ? ? ?Anti-infectives (From admission, onward)  ? ? Start     Dose/Rate Route Frequency Ordered Stop  ? 05/29/21 1800  amoxicillin-clavulanate (AUGMENTIN) 875-125 MG per tablet 1 tablet       ? 1 tablet Oral Every 12 hours 05/29/21 0840 05/31/21 0838  ? 05/29/21 1000  amoxicillin-clavulanate (AUGMENTIN) 875-125 MG per tablet 1 tablet  Status:  Discontinued       ? 1 tablet Oral Every 12 hours 05/29/21 0839 05/29/21 0840  ? 05/29/21 1000  azithromycin (ZITHROMAX) tablet 500 mg       ? 500 mg Oral Daily 05/29/21 0839 05/30/21 1100  ? 05/27/21 1000  azithromycin (ZITHROMAX) 500 mg in sodium chloride 0.9 % 250 mL IVPB  Status:  Discontinued       ? 500 mg ?250 mL/hr over 60 Minutes Intravenous Daily 05/26/21 1648 05/29/21 0839  ? 05/26/21 2000  Ampicillin-Sulbactam (UNASYN) 3 g in  sodium chloride 0.9 % 100 mL IVPB  Status:  Discontinued       ? 3 g ?200 mL/hr over 30 Minutes Intravenous Every 12 hours 05/26/21 1656 05/29/21 0839  ? 05/26/21 1015  azithromycin (ZITHROMAX) tablet 500 mg  Status:  Discontinued       ? 500 mg Oral Daily 05/26/21 0921 05/26/21 1443  ? 05/26/21 0645  levofloxacin (LEVAQUIN) IVPB 750 mg  Status:  Discontinued       ? 750 mg ?100 mL/hr over 90 Minutes Intravenous Every 24 hours 05/26/21 0546 05/26/21 0556  ? 05/26/21 0645  azithromycin (ZITHROMAX) 500 mg in sodium chloride 0.9 % 250 mL IVPB  Status:  Discontinued       ? 500 mg ?250 mL/hr over 60 Minutes Intravenous Every 24 hours 05/26/21 0547 05/26/21 0919  ?  05/26/21 0645  Ampicillin-Sulbactam (UNASYN) 3 g in sodium chloride 0.9 % 100 mL IVPB  Status:  Discontinued       ? 3 g ?200 mL/hr over 30 Minutes Intravenous Every 12 hours 05/26/21 0557 05/26/21 1443  ? 05/23/21 1044  vancomycin (VANCOCIN) powder  Status:  Discontinued       ?   As needed 05/23/21 1044 05/23/21 1137  ? 05/23/21 0654  ceFAZolin (ANCEF) IVPB 2g/100 mL premix       ? 2 g ?200 mL/hr over 30 Minutes Intravenous 60 min pre-op 05/23/21 0655 05/23/21 0745  ? 05/23/21 0652  vancomycin (VANCOCIN) 1-5 GM/200ML-% IVPB       ?Note to Pharmacy: Trudie Reed S: cabinet override  ?    05/23/21 0652 05/23/21 0855  ? 05/23/21 0652  ceFAZolin (ANCEF) 2-4 GM/100ML-% IVPB       ?Note to Pharmacy: Trudie Reed S: cabinet override  ?    05/23/21 0652 05/23/21 0855  ? 05/23/21 0623  vancomycin (VANCOCIN) IVPB 1000 mg/200 mL premix       ? 1,000 mg ?200 mL/hr over 60 Minutes Intravenous 60 min pre-op 05/23/21 2671 05/23/21 0900  ? ?  ? ? ? ? ? ? ? ? ? ?Family Communication/Anticipated D/C date and plan/Code Status  ? ?DVT prophylaxis: enoxaparin (LOVENOX) injection 40 mg Start: 05/30/21 1000 ?Place and maintain sequential compression device Start: 05/27/21 0428 ?SCD's Start: 05/23/21 1214 ? ? ?  Code Status: DNR ? ?Family Communication: None ?Disposition Plan: Plan to  discharge to SNF in 1 to 2 days ? ? ?Status is: Inpatient ?Remains inpatient appropriate because: Awaiting placement to SNF ? ? ? ? ? ? ?Subjective:  ? ?Interval events noted.  No shortness of breath or

## 2021-05-31 NOTE — Progress Notes (Signed)
Speech Language Pathology Treatment: Dysphagia  ?Patient Details ?Name: Kerri Carter ?MRN: [id number] ?DOB: Nov 14, 1940 ?Today's Date: 05/31/2021 ?Time: 0925-1000 ?SLP Time Calculation (min) (ACUTE ONLY): 35 min ? ?Assessment / Plan / Recommendation ?Clinical Impression ? Kerri Carter seen for ongoing assessment of swallowing and toleration of diet. She is alert, verbally responsive and engaged in conversation w/ SLP; Denture placed. Kerri Carter is on 5L O2 [place]; baseline O2 at home.  ? ?Kerri Carter explained general aspiration precautions and agreed verbally to the need for following them especially sitting upright for all oral intake -- supported behind the back for full upright sitting. Kerri Carter agreed verbally she had been too reclined. After min tray setup for conservation of energy, Kerri Carter fed herself breakfast meal of soft solids and thin liquids via cup/straw -- she preferred straw. No overt clinical s/s of aspiration were noted w/ any consistency; respiratory status remained adequately calm and unlabored, vocal quality clear b/t trials. Kerri Carter was educated on, and gently cued, to take REST BREAKS every few bites/sips for conservation of energy; also use small bites and sips w/ less talking during eating/meal. Kerri Carter followed instructions but tended to eat quickly at times. She stated she understood she needed to breath calmly when eating/drinking to lessen risk for aspiration.  ?Oral phase appeared grossly [facility] for bolus management and timely A-P transfer for swallowing; oral clearing achieved w/ all consistencies. NSG denied any deficits in swallowing as well. ?  ?Kerri Carter appears at reduced risk for aspriation when following general aspiration precautions. Kerri Carter does have risk factors for aspiration including pulmonary decline and Esophageal dysmotility at baseline. Recommend continue a Regular/mech soft diet for ease of soft foods w/ gravies added to moisten foods; Thin liquids. Recommend general aspiration precautions; Pills Whole in Puree; tray setup and  positioning assistance for meals. REFLUX precautions d/t Kerri Carter's baseline, and f/u w/ GI for management. ST services will sign off at this time w/ MD to reconsult if needed while admitted. NSG updated. Precautions posted at bedside.  ? ?  ?HPI HPI: Patient is a 81 y.o. female with PMH including heart failure, COPD on 2L home O2 at night, CAD, HTN, HLD, PAD, CKD3, R nephrectomy, admitted 05/23/21 for elective lumbar fusion extension. Postoperatively she developed acute on chronic hypoxic respiratory failure in setting of acute decompensated HFpEF, questionable pneumonia, questionable PE and AECOPD requiring BiPAP.  4/24 Admitted to ICU, Pulmonary perfusion: "pulmonary embolism absent.".  Per chart review, Kerri Carter has remote history of esophageal dilation, most recent EGD in 2018 showed "normal esophagus." CXR 05/27/21: "diffuse bilateral interstitial opacity, more focal and masslike in the left  midlung, and layering bilateral pleural effusions.".  Updated CXR on 05/29/2021: "Dense left perihilar airspace opacity, similar  to slightly improved compared to the previous study. Diffuse  bilateral interstitial thickening. No large pleural fluid  collection.". ?  ?   ?SLP Plan ? All goals met ? ?  ?  ?Recommendations for follow up therapy are one component of a multi-disciplinary discharge planning process, led by the attending physician.  Recommendations may be updated based on patient status, additional functional criteria and insurance authorization. ?  ? ?Recommendations  ?Diet recommendations: Regular;Thin liquid ?Liquids provided via: Cup;Straw ?Medication Administration: Whole meds with liquid (educated on use of puree if needed) ?Supervision: Patient able to self feed ?Compensations: Minimize environmental distractions;Slow rate;Small sips/bites;Lingual sweep for clearance of pocketing;Follow solids with liquid ?Postural Changes and/or Swallow Maneuvers: Out of bed for meals;Seated upright 90 degrees;Upright 30-60 min after  meal (REST BREAKS during meals  for conservation of energy)  ?   ?    ?   ? ? ? ? General recommendations:  (Dietician f/u) ?Oral Care Recommendations: Oral care BID;Oral care before and after PO;Patient independent with oral care (Denture care) ?Follow Up Recommendations: No SLP follow up ?Assistance recommended at discharge: Set up Supervision/Assistance ?SLP Visit Diagnosis: Dysphagia, unspecified (R13.10) ?Plan: All goals met ? ? ? ? ?  ?  ? ? ? ? ?Orinda Kenner, MS, CCC-SLP ?Speech Language Pathologist ?[facility]; [place] ?[phone] (ascom) ?Fabion Gatson ? ?05/31/2021, 10:44 AM ?

## 2021-06-01 DIAGNOSIS — J9601 Acute respiratory failure with hypoxia: Secondary | ICD-10-CM | POA: Diagnosis not present

## 2021-06-01 DIAGNOSIS — J441 Chronic obstructive pulmonary disease with (acute) exacerbation: Secondary | ICD-10-CM | POA: Diagnosis not present

## 2021-06-01 DIAGNOSIS — J690 Pneumonitis due to inhalation of food and vomit: Secondary | ICD-10-CM | POA: Diagnosis not present

## 2021-06-01 DIAGNOSIS — I5043 Acute on chronic combined systolic (congestive) and diastolic (congestive) heart failure: Secondary | ICD-10-CM | POA: Diagnosis not present

## 2021-06-01 LAB — GLUCOSE, CAPILLARY
Glucose-Capillary: 92 mg/dL (ref 70–99)
Glucose-Capillary: 198 mg/dL — ABNORMAL HIGH (ref 70–99)
Glucose-Capillary: 288 mg/dL — ABNORMAL HIGH (ref 70–99)
Glucose-Capillary: 149 mg/dL — ABNORMAL HIGH (ref 70–99)

## 2021-06-01 MED ORDER — PREDNISONE 10 MG PO TABS
5.0000 mg | ORAL_TABLET | Freq: Every day | ORAL | Status: DC
  Administered 2021-06-02: 5 mg via ORAL
  Filled 2021-06-01: qty 1

## 2021-06-01 NOTE — Progress Notes (Signed)
Physical Therapy Treatment ?Patient Details ?Name: Kerri Carter ?MRN: [id number] ?DOB: 03/27/1940 ?Today's Date: 06/01/2021 ? ? ?History of Present Illness Pt is a 81 y/o F admitted to hospital who presents with spondylithesis and lumbar radiculopathy and has failed conservative management. Pt is now s/p L 3/4 & 2/3 fusion with posterolateral arthrodesis from L2 to L5.  Pt noted with acute hypoxia with dyspnea 4/24 and transferred to ICU (pt with acute respiratory failure with hypoxia, L LL PNA, acute on chronic combined systolic and diastolic CHF, AKI superimposed on CKD, and s/p lumbar fusion). PMH: anxiety, aortic stenosis, asthma, CKD, CVA, depression, GERD/PUD, retention, interstitial lung disease, PAD, bipolar disorder, conversion disorder, heart murmur, HLD. ? ?  ?PT Comments  ? ? Pt ready for session.  In chair stating she was able to walk around bed with nursing staff this am.  Since recent gait, session spent on standing ex.  Participated in exercises as described below.  She is fatigued with activity.  Lunch arrives and she remains up in chair with needs met. ?  ?Recommendations for follow up therapy are one component of a multi-disciplinary discharge planning process, led by the attending physician.  Recommendations may be updated based on patient status, additional functional criteria and insurance authorization. ? ?Follow Up Recommendations ? Skilled nursing-short term rehab (<3 hours/day) ?  ?  ?Assistance Recommended at Discharge Frequent or constant Supervision/Assistance  ?Patient can return home with the following A little help with walking and/or transfers;A little help with bathing/dressing/bathroom;Assistance with cooking/housework;Assistance with feeding;Direct supervision/assist for medications management;Direct supervision/assist for financial management;Assist for transportation;Help with stairs or ramp for entrance ?  ?Equipment Recommendations ?    ?  ?Recommendations for Other Services    ? ? ?  ?Precautions / Restrictions Precautions ?Precautions: Fall;Back ?Precaution Booklet Issued: No ?Required Braces or Orthoses: Spinal Brace ?Spinal Brace: Thoracolumbosacral orthotic;Applied in sitting position ?Restrictions ?Weight Bearing Restrictions: No ?RUE Weight Bearing: Weight bearing as tolerated ?LUE Weight Bearing: Weight bearing as tolerated ?RLE Weight Bearing: Weight bearing as tolerated ?LLE Weight Bearing: Weight bearing as tolerated  ?  ? ?Mobility ? Bed Mobility ?  ?  ?  ?  ?  ?  ?  ?General bed mobility comments: in recliner/pre/post session ?  ? ?Transfers ?Overall transfer level: Needs assistance ?Equipment used: Rolling walker (2 wheels) ?Transfers: Sit to/from Stand ?Sit to Stand: Min guard ?  ?  ?  ?  ?  ?  ?  ? ?Ambulation/Gait ?  ?  ?Assistive device: Rolling walker (2 wheels) ?  ?  ?  ?  ?  ? ? ?Stairs ?  ?  ?  ?  ?  ? ? ?Wheelchair Mobility ?  ? ?Modified Rankin (Stroke Patients Only) ?  ? ? ?  ?Balance Overall balance assessment: Needs assistance ?Sitting-balance support: Feet supported, Bilateral upper extremity supported, Single extremity supported ?Sitting balance-Leahy Scale: Good ?  ?  ?Standing balance support: Reliant on assistive device for balance, Bilateral upper extremity supported, During functional activity ?Standing balance-Leahy Scale: Fair ?  ?  ?  ?  ?  ?  ?  ?  ?  ?  ?  ?  ?  ? ?  ?Cognition Arousal/Alertness: Awake/alert ?Behavior During Therapy: [facility] for tasks assessed/performed ?Overall Cognitive Status: Within Functional Limits for tasks assessed ?  ?  ?  ?  ?  ?  ?  ?  ?  ?  ?  ?  ?  ?  ?  ?  ?  ?  ?  ? ?  ?  Exercises Other Exercises ?Other Exercises: standing AROM x 10 for marches, SLR and heel raises with self initiated seated rest each time due to fatigue.  5 x sit to stand ? ?  ?General Comments   ?  ?  ? ?Pertinent Vitals/Pain Pain Assessment ?Pain Assessment: Faces ?Faces Pain Scale: Hurts a little bit ?Pain Location: low back ?Pain Descriptors /  Indicators: Discomfort, Grimacing, Guarding ?Pain Intervention(s): Limited activity within patient's tolerance, Monitored during session, Repositioned  ? ? ?Home Living   ?  ?  ?  ?  ?  ?  ?  ?  ?  ?   ?  ?Prior Function    ?  ?  ?   ? ?PT Goals (current goals can now be found in the care plan section) Progress towards PT goals: Progressing toward goals ? ?  ?Frequency ? ? ? 7X/week ? ? ? ?  ?PT Plan Current plan remains appropriate  ? ? ?Co-evaluation   ?  ?  ?  ?  ? ?  ?AM-PAC PT "6 Clicks" Mobility   ?Outcome Measure ? Help needed turning from your back to your side while in a flat bed without using bedrails?: A Little ?Help needed moving from lying on your back to sitting on the side of a flat bed without using bedrails?: A Lot ?Help needed moving to and from a bed to a chair (including a wheelchair)?: A Little ?Help needed standing up from a chair using your arms (e.g., wheelchair or bedside chair)?: A Little ?Help needed to walk in hospital room?: A Little ?Help needed climbing 3-5 steps with a railing? : A Lot ?6 Click Score: 16 ? ?  ?End of Session Equipment Utilized During Treatment: Oxygen;Gait belt ?Activity Tolerance: Patient tolerated treatment well;Patient limited by fatigue ?Patient left: in chair;with call bell/phone within reach;with chair alarm set ?Nurse Communication: Mobility status;Precautions ?PT Visit Diagnosis: Unsteadiness on feet (R26.81);Pain;Muscle weakness (generalized) (M62.81);Difficulty in walking, not elsewhere classified (R26.2);History of falling (Z91.81);Repeated falls (R29.6) ?  ? ? ?Time: 2714-2320 ?PT Time Calculation (min) (ACUTE ONLY): 15 min ? ?Charges:  $Therapeutic Exercise: 8-22 mins          ?         Chesley Noon, PTA ?06/01/21, 1:33 PM ? ?

## 2021-06-01 NOTE — Progress Notes (Signed)
? ? ? ?Progress Note  ? ? ?Kerri Carter  YWV:[id number] DOB: December 22, 1940  DOA: 05/23/2021 ?PCP: McLean-Scocuzza, Nino Glow, MD  ? ? ? ? ?Brief Narrative:  ? ? ?Medical records reviewed and are as summarized below: ? ?Kerri Carter is a 81 y.o. female with medical history significant for COPD, asthma, bipolar disorder, depression, hypertension, stage III chronic kidney disease,  dyslipidemia, GERD, PAD, peptic ulcer disease, s/p lumbar fusion, new disc herniation at L3-L4 with severe stenosis.  She presented to the hospital for transforaminal lumbar interbody fusion L3/4 and 2/3 and posterolateral arthrodesis L2-L5 on 05/23/2021. ? ?She developed acute hypoxic respiratory failure from pneumonia and acute exacerbation of chronic diastolic CHF.  She required BiPAP and oxygen via high flow nasal cannula for hypoxia.  She was treated with empiric IV antibiotics and IV Lasix.  She developed paroxysmal atrial fibrillation and cardiologist was consulted to assist with management.  She developed AKI likely from IV diuresis. ? ? ?As per NP Meda Coffee (ICU): ?4/21: Underwent elective lumbar fusion with Dr. [name] ?4/22: Declining DVT prophylaxis (Lovenox). ?4/24: Acute Hypoxia and Respiratory Distress requiring BiPAP.  Hospitalist and PCCM consulted.  Aggressive diuresis per Cardiology, empirically covering for pneumonia.  Rule out DVT/PE. ?4/24: NM Pulmonary Perfusion pulmonary embolism absent ?4/24: Echo revealed EF 50 to 55%, left grade II diastolic dysfunction (pseudonormalization) ?4/25: Pt stable on 15L HFNC, PCCM will sign off  ? ? ? ?Assessment/Plan:  ? ?Principal Problem: ?  Acute respiratory failure with hypoxia ([place]) ?Active Problems: ?  Left lower lobe pneumonia ?  Acute on chronic combined systolic and diastolic CHF (congestive heart failure) ([place]) ?  S/P lumbar fusion ?  Acute kidney injury superimposed on chronic kidney disease ([place]) ?  Chronic obstructive pulmonary disease (COPD) ([place]) ?  Peripheral neuropathy ?   Elevated d-dimer ?  Hyperkalemia ?  Anxiety and depression ? ? ?Body mass index is 33.32 kg/m?.  (Obesity) ? ?Acute on chronic hypoxic respiratory failure: She is down to 2 L/min oxygen.  Taper off oxygen as able and use oxygen at night.  She uses 2 L/min oxygen at night for OSA. ? ?Left lower lobe pneumonia, suspected aspiration: Repeat chest x-ray on 05/29/2021 showed persistent dense left perihilar airspace opacity but slightly improved compared to previous study.  Completed Augmentin and azithromycin on 05/30/2021. ? ?Acute on chronic diastolic CHF, moderate to severe aortic stenosis, severe mitral regurgitation: Continue oral Lasix ? ?AKI on CKD stage IIIa: Creatinine is stable.  ? ?Paroxysmal atrial fibrillation, new onset: Continue nebivolol.  CHA2DS2-VASc score is 4.  Cardiologist wants to wait until 14 days before considering long-term anticoagulation.  Earliest date for anticoagulation will be 06/07/2021. ? ?COPD exacerbation: Improved.  Revert back to home dose of prednisone 5 mg daily starting tomorrow.  Continue bronchodilators ? ?S/p lumbar fusion to L2 on 05/23/2021: Continue PT and OT.  Analgesics as needed for pain.  Outpatient follow-up with neurosurgeon. ? ?Type II DM with hyperglycemia: Hemoglobin A1c was 6.0.  Use NovoLog as needed for hyperglycemia. ? ?Generalized weakness, s/p transforaminal lumbar interbody fusion L3/4 and 2/3, posterolateral arthrodesis L2-L5 on 05/23/2021: Analgesics as needed for pain.  Continue PT and OT.  Discharge to SNF recommended. ? ?Continue BuSpar for anxiety ? ?Other comorbidities include anxiety, depression, anemia of chronic disease, OSA on home oxygen at night ? ?Diet Order   ? ?       ?  Diet Heart Room service appropriate? Yes; Fluid consistency: Thin  Diet effective now       ?  ? ?  ?  ? ?  ? ? ? ? ? ? ? ? ? ? ? ?  Consultants: ?Intensivist ?Cardiologist ?Neurosurgeon ? ?Procedures: ?Back surgery on 05/23/2021 ? ? ? ?Medications:  ? ? acetaminophen  1,000 mg Oral  Q6H  ? amLODipine  5 mg Oral Daily  ? budesonide  0.25 mg Nebulization BID  ? busPIRone  5 mg Oral BID  ? Chlorhexidine Gluconate Cloth  6 each Topical Daily  ? Chlorhexidine Gluconate Cloth  6 each Topical Q0600  ? cholecalciferol  4,000 Units Oral Daily  ? dicyclomine  20 mg Oral BID  ? docusate sodium  100 mg Oral BID  ? enoxaparin (LOVENOX) injection  40 mg Subcutaneous Q24H  ? escitalopram  10 mg Oral Daily  ? famotidine  20 mg Oral Daily  ? fluticasone  2 spray Each Nare Daily  ? furosemide  40 mg Oral Daily  ? gabapentin  600 mg Oral BID  ? guaiFENesin  600 mg Oral BID  ? insulin aspart  0-5 Units Subcutaneous QHS  ? insulin aspart  0-9 Units Subcutaneous TID WC  ? ipratropium-albuterol  3 mL Nebulization BID  ? lamoTRIgine  100 mg Oral BID  ? leflunomide  20 mg Oral Daily  ? lidocaine  1 patch Transdermal Daily  ? methocarbamol  500 mg Oral Q6H  ? montelukast  10 mg Oral Daily  ? multivitamin-lutein  1 capsule Oral Daily  ? nebivolol  5 mg Oral Daily  ? [START ON 06/02/2021] predniSONE  5 mg Oral Q breakfast  ? QUEtiapine  25 mg Oral QHS  ? senna  1 tablet Oral BID  ? sodium chloride flush  3 mL Intravenous Q12H  ? sucralfate  1 g Oral BID  ? ?Continuous Infusions: ? sodium chloride    ? methocarbamol (ROBAXIN) IV Stopped (05/28/21 0237)  ? ? ? ?Anti-infectives (From admission, onward)  ? ? Start     Dose/Rate Route Frequency Ordered Stop  ? 05/29/21 1800  amoxicillin-clavulanate (AUGMENTIN) 875-125 MG per tablet 1 tablet       ? 1 tablet Oral Every 12 hours 05/29/21 0840 05/31/21 0838  ? 05/29/21 1000  amoxicillin-clavulanate (AUGMENTIN) 875-125 MG per tablet 1 tablet  Status:  Discontinued       ? 1 tablet Oral Every 12 hours 05/29/21 0839 05/29/21 0840  ? 05/29/21 1000  azithromycin (ZITHROMAX) tablet 500 mg       ? 500 mg Oral Daily 05/29/21 0839 05/30/21 1100  ? 05/27/21 1000  azithromycin (ZITHROMAX) 500 mg in sodium chloride 0.9 % 250 mL IVPB  Status:  Discontinued       ? 500 mg ?250 mL/hr over 60  Minutes Intravenous Daily 05/26/21 1648 05/29/21 0839  ? 05/26/21 2000  Ampicillin-Sulbactam (UNASYN) 3 g in sodium chloride 0.9 % 100 mL IVPB  Status:  Discontinued       ? 3 g ?200 mL/hr over 30 Minutes Intravenous Every 12 hours 05/26/21 1656 05/29/21 0839  ? 05/26/21 1015  azithromycin (ZITHROMAX) tablet 500 mg  Status:  Discontinued       ? 500 mg Oral Daily 05/26/21 0921 05/26/21 1443  ? 05/26/21 0645  levofloxacin (LEVAQUIN) IVPB 750 mg  Status:  Discontinued       ? 750 mg ?100 mL/hr over 90 Minutes Intravenous Every 24 hours 05/26/21 0546 05/26/21 0556  ? 05/26/21 0645  azithromycin (ZITHROMAX) 500 mg in sodium chloride 0.9 % 250 mL IVPB  Status:  Discontinued       ? 500 mg ?250 mL/hr over 60 Minutes Intravenous Every 24 hours 05/26/21 0547 05/26/21  6060  ? 05/26/21 0645  Ampicillin-Sulbactam (UNASYN) 3 g in sodium chloride 0.9 % 100 mL IVPB  Status:  Discontinued       ? 3 g ?200 mL/hr over 30 Minutes Intravenous Every 12 hours 05/26/21 0557 05/26/21 1443  ? 05/23/21 1044  vancomycin (VANCOCIN) powder  Status:  Discontinued       ?   As needed 05/23/21 1044 05/23/21 1137  ? 05/23/21 0654  ceFAZolin (ANCEF) IVPB 2g/100 mL premix       ? 2 g ?200 mL/hr over 30 Minutes Intravenous 60 min pre-op 05/23/21 0655 05/23/21 0745  ? 05/23/21 0652  vancomycin (VANCOCIN) 1-5 GM/200ML-% IVPB       ?Note to Pharmacy: Trudie Reed S: cabinet override  ?    05/23/21 0652 05/23/21 0855  ? 05/23/21 0652  ceFAZolin (ANCEF) 2-4 GM/100ML-% IVPB       ?Note to Pharmacy: Trudie Reed S: cabinet override  ?    05/23/21 0652 05/23/21 0855  ? 05/23/21 0623  vancomycin (VANCOCIN) IVPB 1000 mg/200 mL premix       ? 1,000 mg ?200 mL/hr over 60 Minutes Intravenous 60 min pre-op 05/23/21 0459 05/23/21 0900  ? ?  ? ? ? ? ? ? ? ? ? ?Family Communication/Anticipated D/C date and plan/Code Status  ? ?DVT prophylaxis: enoxaparin (LOVENOX) injection 40 mg Start: 05/30/21 1000 ?Place and maintain sequential compression device Start: 05/27/21  0428 ?SCD's Start: 05/23/21 1214 ? ? ?  Code Status: DNR ? ?Family Communication: None ?Disposition Plan: Plan to discharge to SNF tomorrow ? ? ?Status is: Inpatient ?Remains inpatient appropriate because: Awaiting

## 2021-06-01 NOTE — TOC Progression Note (Addendum)
Transition of Care (TOC) - Progression Note  ? ? ?Patient Details  ?Name: Kerri Carter ?MRN: [id number] ?Date of Birth: 15-Aug-1940 ? ?Transition of Care (TOC) CM/SW Contact  ?Izola Price, RN ?Phone Number: ?06/01/2021, 2:41 PM ? ?Clinical Narrative:  4/30: Insurance auth approved: [employer] ID [id number]. Next review 06/04/21. Patient aware of process and progress. Anticipate discharge 06/02/21. Provider updated via secure chat. Left  confidential VM message for Geri [place]in[place] at [facility]. Simmie Davies RN CM  ? ?Expected Discharge Plan: [place] ?Barriers to Discharge: Continued Medical Work up ? ?Expected Discharge Plan and Services ?Expected Discharge Plan: [place] ?  ?  ?  ?Living arrangements for the past 2 [place] ?                ?  ?  ?  ?  ?  ?  ?  ?  ?  ?  ? ? ?Social Determinants of Health (SDOH) Interventions ?  ? ?Readmission Risk Interventions ? ?  05/24/2021  ?  2:35 PM  ?Readmission Risk Prevention Plan  ?Transportation Screening Complete  ?PCP or Specialist Appt within 3-5 Days Complete  ?[place] or Home Care Consult Complete  ?Social Work Consult for [place] Planning/Counseling Complete  ?Palliative Care Screening Not Applicable  ?Medication Review Press photographer) Complete  ? ? ?

## 2021-06-02 DIAGNOSIS — Z9981 Dependence on supplemental oxygen: Secondary | ICD-10-CM | POA: Diagnosis not present

## 2021-06-02 DIAGNOSIS — Z87891 Personal history of nicotine dependence: Secondary | ICD-10-CM | POA: Diagnosis not present

## 2021-06-02 DIAGNOSIS — J9601 Acute respiratory failure with hypoxia: Secondary | ICD-10-CM | POA: Diagnosis not present

## 2021-06-02 DIAGNOSIS — J690 Pneumonitis due to inhalation of food and vomit: Secondary | ICD-10-CM | POA: Diagnosis not present

## 2021-06-02 DIAGNOSIS — E559 Vitamin D deficiency, unspecified: Secondary | ICD-10-CM | POA: Insufficient documentation

## 2021-06-02 DIAGNOSIS — R5381 Other malaise: Secondary | ICD-10-CM | POA: Diagnosis not present

## 2021-06-02 DIAGNOSIS — I10 Essential (primary) hypertension: Secondary | ICD-10-CM | POA: Diagnosis not present

## 2021-06-02 DIAGNOSIS — I5043 Acute on chronic combined systolic (congestive) and diastolic (congestive) heart failure: Secondary | ICD-10-CM | POA: Diagnosis not present

## 2021-06-02 DIAGNOSIS — Z7401 Bed confinement status: Secondary | ICD-10-CM | POA: Diagnosis not present

## 2021-06-02 DIAGNOSIS — F32A Depression, unspecified: Secondary | ICD-10-CM | POA: Diagnosis not present

## 2021-06-02 DIAGNOSIS — M4326 Fusion of spine, lumbar region: Secondary | ICD-10-CM | POA: Diagnosis not present

## 2021-06-02 DIAGNOSIS — K589 Irritable bowel syndrome without diarrhea: Secondary | ICD-10-CM | POA: Diagnosis not present

## 2021-06-02 DIAGNOSIS — I130 Hypertensive heart and chronic kidney disease with heart failure and stage 1 through stage 4 chronic kidney disease, or unspecified chronic kidney disease: Secondary | ICD-10-CM | POA: Insufficient documentation

## 2021-06-02 DIAGNOSIS — J189 Pneumonia, unspecified organism: Secondary | ICD-10-CM | POA: Diagnosis not present

## 2021-06-02 DIAGNOSIS — F419 Anxiety disorder, unspecified: Secondary | ICD-10-CM

## 2021-06-02 DIAGNOSIS — N1831 Chronic kidney disease, stage 3a: Secondary | ICD-10-CM | POA: Diagnosis not present

## 2021-06-02 DIAGNOSIS — G9009 Other idiopathic peripheral autonomic neuropathy: Secondary | ICD-10-CM | POA: Diagnosis not present

## 2021-06-02 DIAGNOSIS — N189 Chronic kidney disease, unspecified: Secondary | ICD-10-CM | POA: Diagnosis not present

## 2021-06-02 DIAGNOSIS — I2510 Atherosclerotic heart disease of native coronary artery without angina pectoris: Secondary | ICD-10-CM | POA: Diagnosis not present

## 2021-06-02 DIAGNOSIS — J45998 Other asthma: Secondary | ICD-10-CM | POA: Diagnosis not present

## 2021-06-02 DIAGNOSIS — J849 Interstitial pulmonary disease, unspecified: Secondary | ICD-10-CM | POA: Diagnosis not present

## 2021-06-02 DIAGNOSIS — K219 Gastro-esophageal reflux disease without esophagitis: Secondary | ICD-10-CM | POA: Diagnosis not present

## 2021-06-02 DIAGNOSIS — R531 Weakness: Secondary | ICD-10-CM | POA: Diagnosis not present

## 2021-06-02 DIAGNOSIS — J441 Chronic obstructive pulmonary disease with (acute) exacerbation: Secondary | ICD-10-CM | POA: Diagnosis not present

## 2021-06-02 DIAGNOSIS — N179 Acute kidney failure, unspecified: Secondary | ICD-10-CM | POA: Diagnosis not present

## 2021-06-02 DIAGNOSIS — Z981 Arthrodesis status: Secondary | ICD-10-CM | POA: Diagnosis not present

## 2021-06-02 DIAGNOSIS — J449 Chronic obstructive pulmonary disease, unspecified: Secondary | ICD-10-CM | POA: Diagnosis not present

## 2021-06-02 LAB — BASIC METABOLIC PANEL
Anion gap: 9 (ref 5–15)
BUN: 25 mg/dL — ABNORMAL HIGH (ref 8–23)
CO2: 31 mmol/L (ref 22–32)
Calcium: 8.6 mg/dL — ABNORMAL LOW (ref 8.9–10.3)
Chloride: 100 mmol/L (ref 98–111)
Creatinine, Ser: 1.1 mg/dL — ABNORMAL HIGH (ref 0.44–1.00)
GFR, Estimated: 51 mL/min — ABNORMAL LOW (ref >60)
Glucose, Bld: 100 mg/dL — ABNORMAL HIGH (ref 70–99)
Potassium: 4.4 mmol/L (ref 3.5–5.1)
Sodium: 140 mmol/L (ref 135–145)

## 2021-06-02 LAB — CBC
HCT: 32.6 % — ABNORMAL LOW (ref 36.0–46.0)
Hemoglobin: 10.4 g/dL — ABNORMAL LOW (ref 12.0–15.0)
MCH: 27 pg (ref 26.0–34.0)
MCHC: 31.9 g/dL (ref 30.0–36.0)
MCV: 84.7 fL (ref 80.0–100.0)
Platelets: 455 10*3/uL — ABNORMAL HIGH (ref 150–400)
RBC: 3.85 MIL/uL — ABNORMAL LOW (ref 3.87–5.11)
RDW: 14.8 % (ref 11.5–15.5)
WBC: 16.1 10*3/uL — ABNORMAL HIGH (ref 4.0–10.5)
nRBC: 0.1 % (ref 0.0–0.2)

## 2021-06-02 LAB — GLUCOSE, CAPILLARY
Glucose-Capillary: 93 mg/dL (ref 70–99)
Glucose-Capillary: 197 mg/dL — ABNORMAL HIGH (ref 70–99)

## 2021-06-02 MED ORDER — PREDNISONE 5 MG PO TABS: 5.0000 mg | ORAL_TABLET | Freq: Every day | ORAL | 1 refills | Status: DC

## 2021-06-02 MED ORDER — BUSPIRONE HCL 5 MG PO TABS: 5.0000 mg | ORAL_TABLET | Freq: Two times a day (BID) | ORAL | Status: DC

## 2021-06-02 MED ORDER — FUROSEMIDE 40 MG PO TABS: 20.0000 mg | ORAL_TABLET | Freq: Every day | ORAL | Status: DC

## 2021-06-02 MED ORDER — AMLODIPINE BESYLATE 2.5 MG PO TABS: 5.0000 mg | ORAL_TABLET | Freq: Two times a day (BID) | ORAL | Status: DC

## 2021-06-02 NOTE — Discharge Summary (Signed)
?Physician Discharge Summary ?  ?Patient: Kerri Carter MRN: [id number] DOB: 1940-12-10  ?Admit date:     05/23/2021  ?Discharge date:   ?Discharge Physician: Jennye Boroughs  ? ?PCP: McLean-Scocuzza, Nino Glow, MD  ? ?Recommendations at discharge:  ? ? ?Follow-up with neurosurgeon in 2 weeks ?Follow-up with PCP and pulmonologist for routine health maintenance ?Follow-up with cardiologist (office will call to schedule) ? ?Discharge Diagnoses: ?Principal Problem: ?  Acute respiratory failure with hypoxia ([place]) ?Active Problems: ?  Left lower lobe pneumonia ?  Acute on chronic combined systolic and diastolic CHF (congestive heart failure) ([place]) ?  S/P lumbar fusion ?  Acute kidney injury superimposed on chronic kidney disease ([place]) ?  Chronic obstructive pulmonary disease (COPD) ([place]) ?  Peripheral neuropathy ?  Elevated d-dimer ?  Hyperkalemia ?  Anxiety and depression ? ?Resolved Problems: ?  * No resolved hospital problems. * ? ?Hospital Course: ? ?Kerri Carter is a 81 y.o. female with medical history significant for COPD, asthma, chronic hypoxic respiratory failure on 2 L/min oxygen at night, bipolar disorder, depression, hypertension, stage III chronic kidney disease,  dyslipidemia, GERD, PAD, peptic ulcer disease, s/p lumbar fusion, new disc herniation at L3-L4 with severe stenosis.  She presented to the hospital for transforaminal lumbar interbody fusion L3/4 and 2/3 and posterolateral arthrodesis L2-L5 on 05/23/2021. ?  ?She developed acute hypoxic respiratory failure from pneumonia and acute exacerbation of chronic diastolic CHF.  She required BiPAP and oxygen via high flow nasal cannula for hypoxia.  She was treated with empiric IV antibiotics and IV Lasix.  She developed paroxysmal atrial fibrillation and cardiologist was consulted to assist with management.  She developed AKI likely from IV diuresis.  She was also treated with steroids and bronchodilators for suspected COPD exacerbation. ?  ?She was evaluated  by PT and OT who recommended further rehabilitation at a skilled nursing facility.  Her condition has improved and she is deemed stable for discharge to SNF today. She's tolerating room air. ? ? ? ?  ? ? ?Consultants: Cardiologist, intensivist, neurosurgeon ?Procedures performed: Back surgery on 05/23/2021 ?Disposition: Skilled nursing facility ?Diet recommendation:  ?Cardiac and Carb modified diet ?DISCHARGE MEDICATION: ?Allergies as of 06/02/2021   ? ?   Reactions  ? Ceftin [cefuroxime Axetil] Anaphylaxis  ? Lisinopril Anaphylaxis  ? Sulfa Antibiotics Other (See Comments)  ? Face swelling  ? Sulfasalazine Anaphylaxis  ? Morphine Other (See Comments)  ? Per patient, low blood pressure issues that requires action to raise it back up. ?Can take small infrequent doses  ? Xarelto [rivaroxaban] Other (See Comments)  ? Stomach burning, bleeding, and tar in stool  ? Adhesive [tape] Rash  ? Paper tape and tega derm OK  ? Antihistamines, Chlorpheniramine-type Other (See Comments)  ? Makes pt hyper  ? Antivert [meclizine Hcl] Other (See Comments)  ? Bladder will not empty  ? Aspirin Other (See Comments)  ? Sulfasalazine allergy cross reacts  ? Contrast Media [iodinated Contrast Media] Rash  ? she is able to use betadine scrubs.  ? Decongestant [pseudoephedrine Hcl] Other (See Comments)  ? Makes pt hyper  ? Doxycycline Other (See Comments)  ? GI upset  ? Levaquin [levofloxacin In D5w] Rash  ? Polymyxin B Other (See Comments)  ? Facial rash  ? Tetanus Toxoids Rash, Other (See Comments)  ? Fever and hot to touch at injection site  ? ?  ? ?  ?Medication List  ?  ? ?STOP taking these medications   ? ?  amoxicillin-clavulanate 875-125 MG tablet ?Commonly known as: Augmentin ?  ?erythromycin ophthalmic ointment ?  ? ?  ? ?TAKE these medications   ? ?Adalimumab 40 MG/0.4ML Pnkt ?Inject 40 mg into the skin every 14 (fourteen) days. ?  ?albuterol 108 (90 Base) MCG/ACT inhaler ?Commonly known as: Ventolin HFA ?Inhale 2 puffs into the lungs  every 6 (six) hours as needed for wheezing or shortness of breath. ?  ?amLODipine 2.5 MG tablet ?Commonly known as: NORVASC ?Take 2 tablets (5 mg total) by mouth 2 (two) times daily. ?  ?budesonide 0.25 MG/2ML nebulizer solution ?Commonly known as: PULMICORT ?USE 1 VIAL  IN  NEBULIZER TWICE  DAILY - rinse mouth after treatment ?  ?busPIRone 5 MG tablet ?Commonly known as: BUSPAR ?Take 1 tablet (5 mg total) by mouth 2 (two) times daily. ?  ?clopidogrel 75 MG tablet ?Commonly known as: PLAVIX ?Take 75 mg by mouth daily. ?  ?cyclobenzaprine 10 MG tablet ?Commonly known as: FLEXERIL ?Take 1 tablet (10 mg total) by mouth 3 (three) times daily as needed for muscle spasms. ?  ?dicyclomine 10 MG capsule ?Commonly known as: BENTYL ?Take 1 capsule (10 mg total) by mouth 2 (two) times daily as needed for spasms. ?What changed:  ?how much to take ?when to take this ?  ?escitalopram 10 MG tablet ?Commonly known as: LEXAPRO ?TAKE 1 TABLET BY MOUTH EVERY DAY ?  ?etodolac 500 MG tablet ?Commonly known as: LODINE ?Take 500 mg by mouth daily. ?  ?famotidine 20 MG tablet ?Commonly known as: PEPCID ?TAKE 1 TABLET (20 MG TOTAL) BY MOUTH DAILY. BEFORE BREAKFAST OR DINNER ?  ?fluconazole 150 MG tablet ?Commonly known as: DIFLUCAN ?TAKE 1 TABLET (150 MG TOTAL) BY MOUTH ONCE FOR 1 DOSE. REPEAT 2ND DOSE IN 3-7 DAYS AS NEEDED ?  ?fluticasone 50 MCG/ACT nasal spray ?Commonly known as: Flonase ?Place 2 sprays into both nostrils daily. In am ?  ?furosemide 40 MG tablet ?Commonly known as: LASIX ?Take 0.5 tablets (20 mg total) by mouth daily. ?  ?gabapentin 300 MG capsule ?Commonly known as: NEURONTIN ?Take 2 capsules (600 mg total) by mouth in the morning and at bedtime. ?  ?ipratropium-albuterol 0.5-2.5 (3) MG/3ML Soln ?Commonly known as: DUONEB ?TAKE 3 MLS BY NEBULIZATION 3 (THREE) TIMES DAILY AS NEEDED. ?What changed: when to take this ?  ?lamoTRIgine 100 MG tablet ?Commonly known as: LAMICTAL ?TAKE 1 TAB 2 TIMES DAILY. FURTHER REFILLS NEW  PSYCH FOR ALL PSYCH MEDS ONLY TEMP SUPPLY FROM PCP ?  ?leflunomide 20 MG tablet ?Commonly known as: ARAVA ?Take 1 tablet (20 mg total) by mouth daily. ?  ?lidocaine 5 % ?Commonly known as: Lidoderm ?Place 1 patch onto the skin 2 (two) times daily as needed. Remove & Discard patch within 12 hours or as directed by MD ?What changed: when to take this ?  ?lovastatin 20 MG tablet ?Commonly known as: MEVACOR ?TAKE 1 TABLET BY MOUTH EVERYDAY AT BEDTIME *STOP TALKING '40MG'$ * ?What changed: See the new instructions. ?  ?methocarbamol 500 MG tablet ?Commonly known as: ROBAXIN ?Take 500 mg by mouth 4 (four) times daily as needed. ?  ?mirabegron ER 50 MG Tb24 tablet ?Commonly known as: Myrbetriq ?Take 1 tablet (50 mg total) by mouth daily. ?  ?montelukast 10 MG tablet ?Commonly known as: SINGULAIR ?TAKE 1 TABLET BY MOUTH EVERY DAY ?  ?multivitamin-lutein Caps capsule ?Take 1 capsule by mouth at bedtime. ?  ?nebivolol 10 MG tablet ?Commonly known as: Bystolic ?Take 0.5 tablets (5 mg total)  by mouth in the morning and at bedtime. ?  ?OXYGEN ?Inhale 2 L into the lungs at bedtime. ?  ?predniSONE 5 MG tablet ?Commonly known as: DELTASONE ?Take 1 tablet (5 mg total) by mouth daily with breakfast. ?What changed: See the new instructions. ?  ?QUEtiapine 25 MG tablet ?Commonly known as: SEROQUEL ?TAKE 1 TABLET (25 MG TOTAL) BY MOUTH AT BEDTIME. AGAIN LAST FILL FURTHER REFILLS FROM PSYCHIATRY NO EXCEPTIONS ?  ?sucralfate 1 g tablet ?Commonly known as: CARAFATE ?Take 1 tablet (1 g total) by mouth 2 (two) times daily. ?  ?Vitamin D3 50 MCG (2000 UT) Tabs ?Take 4,000 Units by mouth daily. ?  ?zoledronic acid 5 MG/100ML Soln injection ?Commonly known as: RECLAST ?Inject 5 mg into the vein once. ?  ? ?  ? ? Contact information for follow-up providers   ? ? Loleta Dicker, PA Follow up in 2 week(s).   ?Why: For incision check and post-op follow up. This appointment date and time should be included in your pre-op paperwork ?Contact  information: ?[place] [address] ?[phone] ? ? ?  ?  ? ?  ?  ? ? Contact information for after-discharge care   ? ? Destination   ? ? [place]

## 2021-06-02 NOTE — TOC Transition Note (Signed)
Transition of Care (TOC) - CM/SW Discharge Note ? ? ?Patient Details  ?Name: Kerri Carter ?MRN: [id number] ?Date of Birth: 1940/05/09 ? ?Transition of Care (TOC) CM/SW Contact:  ?Laurena Slimmer, RN ?Phone Number: ?06/02/2021, 3:41 PM ? ? ?Clinical Narrative:    ?Spoke with Magda Paganini at facility to confirm discharge to facility today. SNF transfer report and D/C summary sent. Patient, Nurse and MD notified. EMS packet on chart. EMS transport arranged. TOC signing off. ? ?  ?Barriers to Discharge: Continued Medical Work up ? ? ?Patient Goals and CMS Choice ?Patient states their goals for this hospitalization and ongoing recovery are:: SNF ?CMS Medicare.gov Compare Post Acute Care list provided to:: Patient ?Choice offered to / list presented to : Patient ? ?Discharge Placement ?  ?           ?  ?  ?  ?  ? ?Discharge Plan and Services ?  ?  ?           ?  ?  ?  ?  ?  ?  ?  ?  ?  ?  ? ?Social Determinants of Health (SDOH) Interventions ?  ? ? ?Readmission Risk Interventions ? ?  05/24/2021  ?  2:35 PM  ?Readmission Risk Prevention Plan  ?Transportation Screening Complete  ?PCP or Specialist Appt within 3-5 Days Complete  ?[place] or Home Care Consult Complete  ?Social Work Consult for [place] Planning/Counseling Complete  ?Palliative Care Screening Not Applicable  ?Medication Review Press photographer) Complete  ? ? ? ? ? ?

## 2021-06-02 NOTE — Progress Notes (Signed)
Patient discharged. Report given to [facility] at [facility]. Awaiting EMS transportation.  ?

## 2021-06-02 NOTE — Care Management Important Message (Signed)
Important Message ? ?Patient Details  ?Name: Kerri Carter ?MRN: [id number] ?Date of Birth: [birth year] ? ? ?Medicare Important Message Given:  Yes ? ? ? ? ?[name] ?06/02/2021, 11:38 AM ?

## 2021-06-04 DIAGNOSIS — I10 Essential (primary) hypertension: Secondary | ICD-10-CM | POA: Diagnosis not present

## 2021-06-04 DIAGNOSIS — G9009 Other idiopathic peripheral autonomic neuropathy: Secondary | ICD-10-CM | POA: Diagnosis not present

## 2021-06-04 DIAGNOSIS — J441 Chronic obstructive pulmonary disease with (acute) exacerbation: Secondary | ICD-10-CM | POA: Diagnosis not present

## 2021-06-04 DIAGNOSIS — J9601 Acute respiratory failure with hypoxia: Secondary | ICD-10-CM | POA: Diagnosis not present

## 2021-06-04 DIAGNOSIS — N1831 Chronic kidney disease, stage 3a: Secondary | ICD-10-CM | POA: Diagnosis not present

## 2021-06-04 DIAGNOSIS — K219 Gastro-esophageal reflux disease without esophagitis: Secondary | ICD-10-CM | POA: Diagnosis not present

## 2021-06-04 DIAGNOSIS — I2510 Atherosclerotic heart disease of native coronary artery without angina pectoris: Secondary | ICD-10-CM | POA: Diagnosis not present

## 2021-06-04 DIAGNOSIS — I5043 Acute on chronic combined systolic (congestive) and diastolic (congestive) heart failure: Secondary | ICD-10-CM | POA: Diagnosis not present

## 2021-06-06 DIAGNOSIS — K219 Gastro-esophageal reflux disease without esophagitis: Secondary | ICD-10-CM | POA: Diagnosis not present

## 2021-06-06 DIAGNOSIS — G9009 Other idiopathic peripheral autonomic neuropathy: Secondary | ICD-10-CM | POA: Diagnosis not present

## 2021-06-06 DIAGNOSIS — J441 Chronic obstructive pulmonary disease with (acute) exacerbation: Secondary | ICD-10-CM | POA: Diagnosis not present

## 2021-06-06 DIAGNOSIS — I2510 Atherosclerotic heart disease of native coronary artery without angina pectoris: Secondary | ICD-10-CM | POA: Diagnosis not present

## 2021-06-06 DIAGNOSIS — I10 Essential (primary) hypertension: Secondary | ICD-10-CM | POA: Diagnosis not present

## 2021-06-06 DIAGNOSIS — N1831 Chronic kidney disease, stage 3a: Secondary | ICD-10-CM | POA: Diagnosis not present

## 2021-06-06 DIAGNOSIS — J9601 Acute respiratory failure with hypoxia: Secondary | ICD-10-CM | POA: Diagnosis not present

## 2021-06-06 DIAGNOSIS — I5043 Acute on chronic combined systolic (congestive) and diastolic (congestive) heart failure: Secondary | ICD-10-CM | POA: Diagnosis not present

## 2021-06-09 DIAGNOSIS — K219 Gastro-esophageal reflux disease without esophagitis: Secondary | ICD-10-CM | POA: Diagnosis not present

## 2021-06-09 DIAGNOSIS — J9601 Acute respiratory failure with hypoxia: Secondary | ICD-10-CM | POA: Diagnosis not present

## 2021-06-09 DIAGNOSIS — J441 Chronic obstructive pulmonary disease with (acute) exacerbation: Secondary | ICD-10-CM | POA: Diagnosis not present

## 2021-06-09 DIAGNOSIS — I2510 Atherosclerotic heart disease of native coronary artery without angina pectoris: Secondary | ICD-10-CM | POA: Diagnosis not present

## 2021-06-09 DIAGNOSIS — I10 Essential (primary) hypertension: Secondary | ICD-10-CM | POA: Diagnosis not present

## 2021-06-09 DIAGNOSIS — N1831 Chronic kidney disease, stage 3a: Secondary | ICD-10-CM | POA: Diagnosis not present

## 2021-06-09 DIAGNOSIS — I5043 Acute on chronic combined systolic (congestive) and diastolic (congestive) heart failure: Secondary | ICD-10-CM | POA: Diagnosis not present

## 2021-06-09 DIAGNOSIS — G9009 Other idiopathic peripheral autonomic neuropathy: Secondary | ICD-10-CM | POA: Diagnosis not present

## 2021-06-13 DIAGNOSIS — J9601 Acute respiratory failure with hypoxia: Secondary | ICD-10-CM | POA: Diagnosis not present

## 2021-06-13 DIAGNOSIS — G9009 Other idiopathic peripheral autonomic neuropathy: Secondary | ICD-10-CM | POA: Diagnosis not present

## 2021-06-13 DIAGNOSIS — I10 Essential (primary) hypertension: Secondary | ICD-10-CM | POA: Diagnosis not present

## 2021-06-13 DIAGNOSIS — I2510 Atherosclerotic heart disease of native coronary artery without angina pectoris: Secondary | ICD-10-CM | POA: Diagnosis not present

## 2021-06-13 DIAGNOSIS — K219 Gastro-esophageal reflux disease without esophagitis: Secondary | ICD-10-CM | POA: Diagnosis not present

## 2021-06-13 DIAGNOSIS — J441 Chronic obstructive pulmonary disease with (acute) exacerbation: Secondary | ICD-10-CM | POA: Diagnosis not present

## 2021-06-13 DIAGNOSIS — I5043 Acute on chronic combined systolic (congestive) and diastolic (congestive) heart failure: Secondary | ICD-10-CM | POA: Diagnosis not present

## 2021-06-18 ENCOUNTER — Telehealth: Payer: Self-pay

## 2021-06-18 ENCOUNTER — Ambulatory Visit (INDEPENDENT_AMBULATORY_CARE_PROVIDER_SITE_OTHER): Payer: Medicare PPO

## 2021-06-18 VITALS — Ht 61.0 in | Wt 176.0 lb

## 2021-06-18 DIAGNOSIS — Z0000 Encounter for general adult medical examination without abnormal findings: Secondary | ICD-10-CM

## 2021-06-18 NOTE — Telephone Encounter (Signed)
Transition Care Management Follow-up Telephone Call ?Date of discharge and from where: 06/16/21 -[facility] ?How have you been since you were released from the hospital? Patient notes she is walking so much better since the surgery. Incision site looks great! Able to walk from one room to another, resting very good. Arms are weak but not in pain, just tired. Denies all alarming symptoms.  ?Any questions or concerns? No ? ?Items Reviewed: ?Did the pt receive and understand the discharge instructions provided? Yes  ?Medications obtained and verified? Yes  ?Any new allergies since your discharge? No  ?Dietary orders reviewed? Yes ?Do you have support at home? Yes  ? ?Home Care and Equipment/Supplies: ?Were home health services ordered? Yes, HH PT/OT in progress.  ? ?Functional Questionnaire: (I = Independent and D = Dependent) ?ADLs: Family assist ? ?Bathing/Dressing- I ? ?Meal Prep- Family assist ? ?Eating- I ? ?Maintaining continence- Depend brief worn.  ? ?Transferring/Ambulation- walker ? ?Managing Meds- Family assist. No questions/concerns.  ? ?Follow up appointments reviewed: ? ?PCP Hospital f/u appt confirmed? Yes  Scheduled to see PCP on 06/24/21 @ 1:00. Discharge notes follow up routine maintenance.  ?[facility] f/u appt confirmed? Yes , Scheduled to see Neurosurgeon 07/08/21. Agrees to schedule with pulmonology and cardiology as directed per discharge note.  ?Are transportation arrangements needed? No . Arrangements are made.  ?If their condition worsens, is the pt aware to call PCP or go to the Emergency Dept.? Yes ?Was the patient provided with contact information for the PCP's office or ED? Yes ?Was to pt encouraged to call back with questions or concerns? Yes  ?

## 2021-06-18 NOTE — Patient Instructions (Addendum)
?  Ms. Spillman , ?Thank you for taking time to come for your Medicare Wellness Visit. I appreciate your ongoing commitment to your health goals. Please review the following plan we discussed and let me know if I can assist you in the future.  ? ?These are the goals we discussed: ? Goals   ? ?  Follow up with Primary Care Provider   ?  As needed. ?  ? ?  ?  ?This is a list of the screening recommended for you and due dates:  ?Health Maintenance  ?Topic Date Due  ? Hepatitis C Screening: USPSTF Recommendation to screen - Ages 18-79 yo.  06/19/2022*  ? Flu Shot  09/02/2021  ? Hemoglobin A1C  11/27/2021  ? Pneumonia Vaccine  Completed  ? DEXA scan (bone density measurement)  Completed  ? HPV Vaccine  Aged Out  ? Complete foot exam   Discontinued  ? Eye exam for diabetics  Discontinued  ? Urine Protein Check  Discontinued  ? Colon Cancer Screening  Discontinued  ? COVID-19 Vaccine  Discontinued  ? Zoster (Shingles) Vaccine  Discontinued  ?*Topic was postponed. The date shown is not the original due date.  ?  ?

## 2021-06-18 NOTE — Progress Notes (Signed)
Subjective:   Kerri Carter is a 81 y.o. female who presents for Medicare Annual (Subsequent) preventive examination.  Review of Systems    No ROS.  Medicare Wellness Virtual Visit.  Visual/audio telehealth visit, UTA vital signs.   See social history for additional risk factors.   Cardiac Risk Factors include: advanced age (>72men, >57 women)     Objective:    Today's Vitals   06/18/21 1103  Weight: 176 lb (79.8 kg)  Height: 5\' 1"  (1.549 m)   Body mass index is 33.25 kg/m.     05/23/2021    6:42 AM 05/13/2021    9:24 AM 02/10/2021   10:51 AM 01/12/2021    1:23 PM 12/11/2020    8:32 AM 11/29/2020    2:05 PM 11/22/2020    7:32 AM  Advanced Directives  Does Patient Have a Medical Advance Directive? No No No No Yes Yes No  Type of Advance Directive     Living will Living will   Does patient want to make changes to medical advance directive?     No - Patient declined    Would patient like information on creating a medical advance directive? No - Patient declined No - Patient declined  No - Patient declined No - Patient declined  No - Patient declined    Current Medications (verified) Outpatient Encounter Medications as of 06/18/2021  Medication Sig   Adalimumab 40 MG/0.4ML PNKT Inject 40 mg into the skin every 14 (fourteen) days.   albuterol (VENTOLIN HFA) 108 (90 Base) MCG/ACT inhaler Inhale 2 puffs into the lungs every 6 (six) hours as needed for wheezing or shortness of breath.   amLODipine (NORVASC) 2.5 MG tablet Take 2 tablets (5 mg total) by mouth 2 (two) times daily.   budesonide (PULMICORT) 0.25 MG/2ML nebulizer solution USE 1 VIAL  IN  NEBULIZER TWICE  DAILY - rinse mouth after treatment   busPIRone (BUSPAR) 5 MG tablet Take 1 tablet (5 mg total) by mouth 2 (two) times daily.   Cholecalciferol (VITAMIN D3) 50 MCG (2000 UT) TABS Take 4,000 Units by mouth daily.   clopidogrel (PLAVIX) 75 MG tablet Take 75 mg by mouth daily.   cyclobenzaprine (FLEXERIL) 10 MG tablet  Take 1 tablet (10 mg total) by mouth 3 (three) times daily as needed for muscle spasms.   dicyclomine (BENTYL) 10 MG capsule Take 1 capsule (10 mg total) by mouth 2 (two) times daily as needed for spasms. (Patient taking differently: Take 20 mg by mouth 2 (two) times daily.)   escitalopram (LEXAPRO) 10 MG tablet TAKE 1 TABLET BY MOUTH EVERY DAY   etodolac (LODINE) 500 MG tablet Take 500 mg by mouth daily.   famotidine (PEPCID) 20 MG tablet TAKE 1 TABLET (20 MG TOTAL) BY MOUTH DAILY. BEFORE BREAKFAST OR DINNER   fluconazole (DIFLUCAN) 150 MG tablet TAKE 1 TABLET (150 MG TOTAL) BY MOUTH ONCE FOR 1 DOSE. REPEAT 2ND DOSE IN 3-7 DAYS AS NEEDED   fluticasone (FLONASE) 50 MCG/ACT nasal spray Place 2 sprays into both nostrils daily. In am   furosemide (LASIX) 40 MG tablet Take 0.5 tablets (20 mg total) by mouth daily.   gabapentin (NEURONTIN) 300 MG capsule Take 2 capsules (600 mg total) by mouth in the morning and at bedtime.   ipratropium-albuterol (DUONEB) 0.5-2.5 (3) MG/3ML SOLN TAKE 3 MLS BY NEBULIZATION 3 (THREE) TIMES DAILY AS NEEDED. (Patient taking differently: Take 3 mLs by nebulization 2 (two) times daily.)   lamoTRIgine (LAMICTAL) 100 MG  tablet TAKE 1 TAB 2 TIMES DAILY. FURTHER REFILLS NEW PSYCH FOR ALL PSYCH MEDS ONLY TEMP SUPPLY FROM PCP   leflunomide (ARAVA) 20 MG tablet Take 1 tablet (20 mg total) by mouth daily.   lidocaine (LIDODERM) 5 % Place 1 patch onto the skin 2 (two) times daily as needed. Remove & Discard patch within 12 hours or as directed by MD (Patient taking differently: Place 1 patch onto the skin daily. Remove & Discard patch within 12 hours or as directed by MD)   lovastatin (MEVACOR) 20 MG tablet TAKE 1 TABLET BY MOUTH EVERYDAY AT BEDTIME *STOP TALKING 40MG * (Patient taking differently: Take 20 mg by mouth at bedtime.)   methocarbamol (ROBAXIN) 500 MG tablet Take 500 mg by mouth 4 (four) times daily as needed.   mirabegron ER (MYRBETRIQ) 50 MG TB24 tablet Take 1 tablet (50 mg  total) by mouth daily.   montelukast (SINGULAIR) 10 MG tablet TAKE 1 TABLET BY MOUTH EVERY DAY   multivitamin-lutein (OCUVITE-LUTEIN) CAPS capsule Take 1 capsule by mouth at bedtime.   nebivolol (BYSTOLIC) 10 MG tablet Take 0.5 tablets (5 mg total) by mouth in the morning and at bedtime.   OXYGEN Inhale 2 L into the lungs at bedtime.   predniSONE (DELTASONE) 5 MG tablet Take 1 tablet (5 mg total) by mouth daily with breakfast.   QUEtiapine (SEROQUEL) 25 MG tablet TAKE 1 TABLET (25 MG TOTAL) BY MOUTH AT BEDTIME. AGAIN LAST FILL FURTHER REFILLS FROM PSYCHIATRY NO EXCEPTIONS   sucralfate (CARAFATE) 1 g tablet Take 1 tablet (1 g total) by mouth 2 (two) times daily.   zoledronic acid (RECLAST) 5 MG/100ML SOLN injection Inject 5 mg into the vein once.   No facility-administered encounter medications on file as of 06/18/2021.    Allergies (verified) Ceftin [cefuroxime axetil]; Lisinopril; Sulfa antibiotics; Sulfasalazine; Morphine; Xarelto [rivaroxaban]; Adhesive [tape]; Antihistamines, chlorpheniramine-type; Antivert [meclizine hcl]; Aspirin; Contrast media [iodinated contrast media]; Decongestant [pseudoephedrine hcl]; Doxycycline; Levaquin [levofloxacin in d5w]; Polymyxin b; and Tetanus toxoids   History: Past Medical History:  Diagnosis Date   Anxiety    Aortic stenosis 07/09/2015   a.) TTE 07/06/2015: EF 55-60%; mild AS with MPG 13.0 mmHg.   Asthma    Bipolar disorder (HCC)    C. difficile diarrhea 2010   a.) following ABX course during hospital admission   Carotid atherosclerosis, bilateral    a.) Moderate; < 50% stenosis BILATERAL ICAs.   Cataract    a.) s/p BILATERAL extraction   Chicken pox    CKD (chronic kidney disease), stage III (HCC)    a. s/p R nephrectomy./ aneurysm   Conversion disorder    COPD (chronic obstructive pulmonary disease) (HCC)    Depression    Essential hypertension    GERD (gastroesophageal reflux disease)    Heart murmur    Hyperlipidemia    ILD  (interstitial lung disease) (HCC)    mild; 2/2 RA diagnosis   Inflammatory arthritis    a. hands/carpal tunnel.  b. Low titer rheumatoid factor. c. Negative anti-CCP antibodies. d. Plaquenil.   Macular degeneration    Nocturnal hypoxemia    Non-Obstructive CAD    a. 07/2009 Cath ([facility]): nonobs dzs;  b. 03/2011 Cath [facility]): nonobs dzs.   Osteoarthritis    a. Knees.   PAD (peripheral artery disease) (HCC)    PUD (peptic ulcer disease)    S/P right hip fracture    11/01/16 s/p repair   Shoulder pain    Sleep apnea    no  cpap / minimal   Spinal stenosis at L4-L5 level    severe with L4/L5 anterolisthesis grade 1 anterolisthesis    Toxic maculopathy    Valvular heart disease    a.) TTE 07/06/2015: EF 55-60%; Mild MR/AR/TR; Mild AS with MPG 13.0 mmHg.   Past Surgical History:  Procedure Laterality Date   APPENDECTOMY     APPLICATION OF INTRAOPERATIVE CT SCAN N/A 05/23/2021   Procedure: APPLICATION OF INTRAOPERATIVE CT SCAN;  Surgeon: Venetia Night, MD;  Location: [facility];  Service: Neurosurgery;  Laterality: N/A;   BACK SURGERY     lumbar fusion   BUNIONECTOMY Right    CATARACT EXTRACTION, BILATERAL     CESAREAN SECTION     x1   CHOLECYSTECTOMY N/A 05/11/2016   Procedure: LAPAROSCOPIC CHOLECYSTECTOMY;  Surgeon: Lattie Haw, MD;  Location: [facility];  Service: General;  Laterality: N/A;   COLONOSCOPY WITH PROPOFOL N/A 04/02/2016   Procedure: COLONOSCOPY WITH PROPOFOL;  Surgeon: Wyline Mood, MD;  Location: [facility];  Service: Endoscopy;  Laterality: N/A;   ENDOSCOPIC RETROGRADE CHOLANGIOPANCREATOGRAPHY (ERCP) WITH PROPOFOL N/A 05/08/2016   Procedure: ENDOSCOPIC RETROGRADE CHOLANGIOPANCREATOGRAPHY (ERCP) WITH PROPOFOL;  Surgeon: Midge Minium, MD;  Location: [facility];  Service: Endoscopy;  Laterality: N/A;   ERCP     with biliary spincterotomy 05/08/16 Dr. Servando Snare for choledocholithiasis    ESOPHAGEAL DILATION  04/02/2016   Procedure: ESOPHAGEAL DILATION;  Surgeon: Wyline Mood, MD;   Location: [facility];  Service: Endoscopy;;   ESOPHAGOGASTRODUODENOSCOPY (EGD) WITH PROPOFOL N/A 04/02/2016   Procedure: ESOPHAGOGASTRODUODENOSCOPY (EGD) WITH PROPOFOL;  Surgeon: Wyline Mood, MD;  Location: [facility];  Service: Endoscopy;  Laterality: N/A;   HIP ARTHROPLASTY Right 11/01/2016   Procedure: ARTHROPLASTY BIPOLAR HIP (HEMIARTHROPLASTY);  Surgeon: Christena Flake, MD;  Location: [facility];  Service: Orthopedics;  Laterality: Right;   LUMBAR LAMINECTOMY/DECOMPRESSION MICRODISCECTOMY N/A 12/11/2020   Procedure: LEFT L2-3 MICRODISCECTOMY, L3-4 AND L5-S1 DECOMPRESSION;  Surgeon: Venetia Night, MD;  Location: [facility];  Service: Neurosurgery;  Laterality: N/A;   NEPHRECTOMY  1988   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   ORIF SCAPHOID FRACTURE Left 12/21/2019   Procedure: OPEN REDUCTION INTERNAL FIXATION (ORIF) OF LEFT SCAPULAR NONUNION WITH BONE GRAFT;  Surgeon: Christena Flake, MD;  Location: [facility];  Service: Orthopedics;  Laterality: Left;   osteoporosis     noted DEXA 08/19/16    REPLACEMENT TOTAL KNEE Right    REVERSE SHOULDER ARTHROPLASTY Right 11/04/2017   Procedure: REVERSE SHOULDER ARTHROPLASTY;  Surgeon: Christena Flake, MD;  Location: [facility];  Service: Orthopedics;  Laterality: Right;   REVERSE SHOULDER ARTHROPLASTY Left 07/26/2018   Procedure: REVERSE SHOULDER ARTHROPLASTY;  Surgeon: Christena Flake, MD;  Location: [facility];  Service: Orthopedics;  Laterality: Left;   TONSILLECTOMY     TOTAL HIP ARTHROPLASTY  12/10/11   [facility] left hip   TOTAL HIP ARTHROPLASTY Bilateral    TRANSFORAMINAL LUMBAR INTERBODY FUSION (TLIF) WITH PEDICLE SCREW FIXATION 3 LEVEL N/A 05/23/2021   Procedure: OPEN L2-4 TRANSFORAMINAL LUMBAR INTERBODY FUSION (TLIF) WITH L2-5 POSTERIOR SPINAL FUSION;  Surgeon: Venetia Night, MD;  Location: [facility];  Service: Neurosurgery;  Laterality: N/A;   TUBAL LIGATION     Family History  Problem Relation Age of Onset   Rheum  arthritis Mother    Asthma Mother    Parkinson's disease Mother    Heart disease Mother    Stroke Mother    Hypertension Mother    Heart attack Father  Heart disease Father    Hypertension Father    Peripheral Artery Disease Father    Diabetes Son    Gout Son    Asthma Sister    Heart disease Sister    Lung cancer Sister    Heart disease Sister    Heart disease Sister    Breast cancer Sister    Heart attack Sister    Heart disease Brother    Heart disease Maternal Grandmother    Diabetes Maternal Grandmother    Colon cancer Maternal Grandmother    Cancer Maternal Grandmother        Hodgkins lymphoma   Heart disease Brother    Alcohol abuse Brother    Depression Brother    Dementia Son    Social History   Socioeconomic History   Marital status: Widowed    Spouse name: richard   Number of children: 2   Years of education: Some Coll   Highest education level: Some college, no degree  Occupational History   Occupation: Retired    Comment: retired  Tobacco Use   Smoking status: Former    Packs/day: 0.50    Years: 20.00    Pack years: 10.00    Types: Cigarettes    Quit date: 02/02/1974    Years since quitting: 47.4   Smokeless tobacco: Never  Vaping Use   Vaping Use: Never used  Substance and Sexual Activity   Alcohol use: No   Drug use: No   Sexual activity: Not Currently  Other Topics Concern   Not on file  Social History Narrative   Lives at home in [place] grandson, his wife and their child.   Husband Swan Werne died covid 97 late 1/2052married x 51 years   Right-handed.   6 cups coffee per day.   Social Determinants of Health   Financial Resource Strain: Low Risk    Difficulty of Paying Living Expenses: Not hard at all  Food Insecurity: No Food Insecurity   Worried About Programme researcher, broadcasting/film/video in the Last Year: Never true   Ran Out of Food in the Last Year: Never true  Transportation Needs: No Transportation Needs   Lack of Transportation  (Medical): No   Lack of Transportation (Non-Medical): No  Physical Activity: Not on file  Stress: No Stress Concern Present   Feeling of Stress : Not at all  Social Connections: Moderately Integrated   Frequency of Communication with Friends and Family: More than three times a week   Frequency of Social Gatherings with Friends and Family: Never   Attends Religious Services: More than 4 times per year   Active Member of [employer] or Organizations: No   Attends Engineer, structural: Never   Marital Status: Married    Tobacco Counseling Counseling given: Not Answered   Clinical Intake:  Pre-visit preparation completed: Yes        Diabetes: No  How often do you need to have someone help you when you read instructions, pamphlets, or other written materials from your doctor or pharmacy?: 3 - Sometimes  Interpreter Needed?: No      Activities of Daily Living    06/18/2021   11:09 AM 05/23/2021    6:04 PM  In your present state of health, do you have any difficulty performing the following activities:  Hearing? 0 0  Vision? 0 0  Difficulty concentrating or making decisions? 0 0  Walking or climbing stairs? 1 1  Comment Walker in use when ambulating  Dressing or bathing? 0 0  Doing errands, shopping? 1 0  Comment Family Advice worker and eating ? Y   Comment Family assist   Using the Toilet? N   In the past six months, have you accidently leaked urine? Y   Comment Managed with daily pad   Do you have problems with loss of bowel control? N   Managing your Medications? Y   Comment Family assist   Managing your Finances? Y   Comment Family assist   Housekeeping or managing your Housekeeping? Y   Comment Family assist     Patient Care Team: McLean-Scocuzza, Pasty Spillers, MD as PCP - General (Internal Medicine)  Indicate any recent Medical Services you may have received from other than [facility] providers in the past year (date may be approximate).      Assessment:   This is a routine wellness examination for Aneri.  Virtual Visit via Telephone Note  I connected with  Kerri Carter on 06/18/21 at 11:00 AM EDT by telephone and verified that I am speaking with the correct person using two identifiers.  Persons participating in the virtual visit: patient/Nurse Health Advisor   I discussed the limitations of performing an evaluation and management service by telehealth. We continued and completed visit with audio only. Some vital signs may be absent or patient reported.   Hearing/Vision screen Hearing Screening - Comments:: Patient is able to hear conversational tones without difficulty.  No issues reported. Vision Screening - Comments:: Cataract extraction, bilateral  Wears reading glasses  Dietary issues and exercise activities discussed:   Regular diet Good water intake   Goals Addressed             This Visit's Progress    Follow up with Primary Care Provider       As needed.       Depression Screen    06/18/2021   11:06 AM 11/24/2019   12:58 PM 03/16/2019    9:54 AM 11/23/2018   12:34 PM 07/07/2018    1:15 PM 02/24/2018   10:10 AM 10/19/2017    2:34 PM  PHQ 2/9 Scores  PHQ - 2 Score 0 0 4 0 0 0 0  PHQ- 9 Score   16        Fall Risk    06/18/2021   11:08 AM 11/22/2020    2:58 PM 09/03/2020   11:56 AM 12/06/2019   11:09 AM 08/29/2019    1:56 PM  Fall Risk   Falls in the past year? 1 1 1 1 1   Number falls in past yr: 1 1 1 1 1   Injury with Fall? 0 1 1 1 1   Risk for fall due to : Impaired balance/gait;History of fall(s) Impaired balance/gait;History of fall(s);Impaired mobility History of fall(s) Impaired balance/gait;Impaired mobility;History of fall(s) Impaired balance/gait;Impaired mobility  Follow up Falls evaluation completed Falls evaluation completed Falls evaluation completed Falls evaluation completed Falls evaluation completed    FALL RISK PREVENTION PERTAINING TO THE HOME: Home free of loose throw rugs  in walkways, pet beds, electrical cords, etc? Yes  Adequate lighting in your home to reduce risk of falls? Yes   ASSISTIVE DEVICES UTILIZED TO PREVENT FALLS: Life alert? Yes  Use of a cane, walker or w/c? Yes  Grab bars in the bathroom? Yes  Shower chair or bench in shower? Yes  Elevated toilet seat or a handicapped toilet? Yes   TIMED UP AND GO: Was the test performed? No .  Cognitive Function: Patient is alert and oriented x3.    03/01/2017    3:43 PM 02/28/2016    2:22 PM 07/11/2015   10:18 AM  MMSE - Mini Mental State Exam  Orientation to time 5 5 4   Orientation to Place 5 5 5   Registration 3 3 3   Attention/ Calculation 5 5 5   Recall 3 3 1   Language- name 2 objects 2 2 2   Language- repeat 1 1 1   Language- follow 3 step command 3 3 3   Language- read & follow direction 1 1 1   Write a sentence 1 1 1   Copy design 1 1 1   Total score 30 30 27         Immunizations Immunization History  Administered Date(s) Administered   Fluad Quad(high Dose 65+) 10/03/2018, 10/16/2019, 11/22/2020   Influenza Split 10/04/2012, 10/25/2013, 10/28/2016   Influenza Whole 11/03/2011   Influenza, High Dose Seasonal PF 10/04/2015, 11/03/2016   Influenza,inj,Quad PF,6+ Mos 10/31/2014   Influenza-Unspecified 01/02/2010, 11/24/2011, 10/21/2013, 10/31/2014, 09/28/2017   Pneumococcal Conjugate-13 10/21/2013   Pneumococcal Polysaccharide-23 11/07/2015   Shingix vaccine- discontinue per patient preference.   Screening Tests Health Maintenance  Topic Date Due   Hepatitis C Screening  06/19/2022 (Originally 09/21/1958)   INFLUENZA VACCINE  09/02/2021   HEMOGLOBIN A1C  11/27/2021   Pneumonia Vaccine 68+ Years old  Completed   DEXA SCAN  Completed   HPV VACCINES  Aged Out   FOOT EXAM  Discontinued   OPHTHALMOLOGY EXAM  Discontinued   URINE MICROALBUMIN  Discontinued   COLONOSCOPY (Pts 45-54yrs Insurance coverage will need to be confirmed)  Discontinued   COVID-19 Vaccine  Discontinued   Zoster  Vaccines- Shingrix  Discontinued   Health Maintenance There are no preventive care reminders to display for this patient.  Mammogram- plans to schedule 08/06/21.  Hepatitis C Screening: does not qualify.  Vision Screening: Recommended annual ophthalmology exams for early detection of glaucoma and other disorders of the eye.  Dental Screening: Recommended annual dental exams for proper oral hygiene  Community Resource Referral / Chronic Care Management: CRR required this visit?  No   CCM required this visit?  No      Plan:   Keep all routine maintenance appointments.   I have personally reviewed and noted the following in the patient's chart:   Medical and social history Use of alcohol, tobacco or illicit drugs  Current medications and supplements including opioid prescriptions.  Functional ability and status Nutritional status Physical activity Advanced directives List of other physicians Hospitalizations, surgeries, and ER visits in previous 12 months Vitals Screenings to include cognitive, depression, and falls Referrals and appointments  In addition, I have reviewed and discussed with patient certain preventive protocols, quality metrics, and best practice recommendations. A written personalized care plan for preventive services as well as general preventive health recommendations were provided to patient.     Ashok Pall, LPN   [id number]/61/0960

## 2021-06-24 ENCOUNTER — Ambulatory Visit (INDEPENDENT_AMBULATORY_CARE_PROVIDER_SITE_OTHER): Payer: Medicare PPO | Admitting: Internal Medicine

## 2021-06-24 ENCOUNTER — Encounter: Payer: Self-pay | Admitting: Internal Medicine

## 2021-06-24 DIAGNOSIS — D72819 Decreased white blood cell count, unspecified: Secondary | ICD-10-CM | POA: Diagnosis not present

## 2021-06-24 DIAGNOSIS — N179 Acute kidney failure, unspecified: Secondary | ICD-10-CM | POA: Diagnosis not present

## 2021-06-24 DIAGNOSIS — J8489 Other specified interstitial pulmonary diseases: Secondary | ICD-10-CM | POA: Diagnosis not present

## 2021-06-24 DIAGNOSIS — D649 Anemia, unspecified: Secondary | ICD-10-CM | POA: Diagnosis not present

## 2021-06-24 DIAGNOSIS — F419 Anxiety disorder, unspecified: Secondary | ICD-10-CM | POA: Diagnosis not present

## 2021-06-24 DIAGNOSIS — D75839 Thrombocytosis, unspecified: Secondary | ICD-10-CM

## 2021-06-24 DIAGNOSIS — J449 Chronic obstructive pulmonary disease, unspecified: Secondary | ICD-10-CM | POA: Diagnosis not present

## 2021-06-24 DIAGNOSIS — R946 Abnormal results of thyroid function studies: Secondary | ICD-10-CM | POA: Insufficient documentation

## 2021-06-24 DIAGNOSIS — R059 Cough, unspecified: Secondary | ICD-10-CM | POA: Diagnosis not present

## 2021-06-24 DIAGNOSIS — D72829 Elevated white blood cell count, unspecified: Secondary | ICD-10-CM

## 2021-06-24 DIAGNOSIS — I38 Endocarditis, valve unspecified: Secondary | ICD-10-CM

## 2021-06-24 DIAGNOSIS — D72820 Lymphocytosis (symptomatic): Secondary | ICD-10-CM

## 2021-06-24 HISTORY — DX: Thrombocytosis, unspecified: D75.839

## 2021-06-24 HISTORY — DX: Decreased white blood cell count, unspecified: D72.819

## 2021-06-24 MED ORDER — IPRATROPIUM-ALBUTEROL 0.5-2.5 (3) MG/3ML IN SOLN: 3.0000 mL | Freq: Three times a day (TID) | RESPIRATORY_TRACT | 3 refills | Status: DC | PRN

## 2021-06-24 MED ORDER — ALBUTEROL SULFATE HFA 108 (90 BASE) MCG/ACT IN AERS: 2.0000 | INHALATION_SPRAY | Freq: Four times a day (QID) | RESPIRATORY_TRACT | 11 refills | Status: DC | PRN

## 2021-06-24 MED ORDER — BUDESONIDE 0.25 MG/2ML IN SUSP: RESPIRATORY_TRACT | 11 refills | Status: DC

## 2021-06-24 NOTE — Progress Notes (Signed)
Telephone Note  I connected with Jeanella Anton  on 06/24/21 at  1:00 PM EDT by telephone and verified that I am speaking with the correct person using two identifiers.  Location patient: [place] Location provider:work or home office Persons participating in the virtual visit: patient, provider  I discussed the limitations and requested verbal permission for telemedicine visit. The patient expressed understanding and agreed to proceed.   HPI:  Acute telemedicine visit for : HFU  05/23/21 to 06/02/21 for low back surgery this is 3rd surgery then had acute respiratory failure with hypoxia with acute on chronic chf required 15 L high flow O2 and bipap, LLL pneumonia she is feeling better less sob back on 2L O2 qhs and Afib in hospital now hr in in the 80s. She did go to [facility] after discharge doing well no pain (back, legs, buttocks)  -Pertinent past medical history: see below -Pertinent medication allergies: Allergies  Allergen Reactions   Ceftin [Cefuroxime Axetil] Anaphylaxis   Lisinopril Anaphylaxis   Sulfa Antibiotics Other (See Comments)    Face swelling   Sulfasalazine Anaphylaxis   Morphine Other (See Comments)    Per patient, low blood pressure issues that requires action to raise it back up. Can take small infrequent doses   Xarelto [Rivaroxaban] Other (See Comments)    Stomach burning, bleeding, and tar in stool   Adhesive [Tape] Rash    Paper tape and tega derm OK   Antihistamines, Chlorpheniramine-Type Other (See Comments)    Makes pt hyper   Antivert [Meclizine Hcl] Other (See Comments)    Bladder will not empty   Aspirin Other (See Comments)    Sulfasalazine allergy cross reacts   Contrast Media [Iodinated Contrast Media] Rash    she is able to use betadine scrubs.   Decongestant [Pseudoephedrine Hcl] Other (See Comments)    Makes pt hyper   Doxycycline Other (See Comments)    GI upset   Levaquin [Levofloxacin In D5w] Rash   Polymyxin B Other (See Comments)     Facial rash   Tetanus Toxoids Rash and Other (See Comments)    Fever and hot to touch at injection site   -COVID-19 vaccine status:  Immunization History  Administered Date(s) Administered   Fluad Quad(high Dose 65+) 10/03/2018, 10/16/2019, 11/22/2020   Influenza Split 10/04/2012, 10/25/2013, 10/28/2016   Influenza Whole 11/03/2011   Influenza, High Dose Seasonal PF 10/04/2015, 11/03/2016   Influenza,inj,Quad PF,6+ Mos 10/31/2014   Influenza-Unspecified 01/02/2010, 11/24/2011, 10/21/2013, 10/31/2014, 09/28/2017   Pneumococcal Conjugate-13 10/21/2013   Pneumococcal Polysaccharide-23 11/07/2015     ROS: See pertinent positives and negatives per HPI.  Past Medical History:  Diagnosis Date   Anxiety    Aortic stenosis 07/09/2015   a.) TTE 07/06/2015: EF 55-60%; mild AS with MPG 13.0 mmHg.   Asthma    Bipolar disorder ([place])    C. difficile diarrhea 2010   a.) following ABX course during hospital admission   Carotid atherosclerosis, bilateral    a.) Moderate; < 50% stenosis BILATERAL ICAs.   Cataract    a.) s/p BILATERAL extraction   Chicken pox    CKD (chronic kidney disease), stage III (HCC)    a. s/p R nephrectomy./ aneurysm   Conversion disorder    COPD (chronic obstructive pulmonary disease) (HCC)    Depression    Essential hypertension    GERD (gastroesophageal reflux disease)    Heart murmur    Hyperlipidemia    ILD (interstitial lung disease) (HCC)    mild;  2/2 RA diagnosis   Inflammatory arthritis    a. hands/carpal tunnel.  b. Low titer rheumatoid factor. c. Negative anti-CCP antibodies. d. Plaquenil.   Macular degeneration    Nocturnal hypoxemia    Non-Obstructive CAD    a. 07/2009 Cath ([facility]): nonobs dzs;  b. 03/2011 Cath [facility] nonobs dzs.   Osteoarthritis    a. Knees.   PAD (peripheral artery disease) (HCC)    PUD (peptic ulcer disease)    S/P right hip fracture    11/01/16 s/p repair   Shoulder pain    Sleep apnea    no cpap / minimal   Spinal  stenosis at L4-L5 level    severe with L4/L5 anterolisthesis grade 1 anterolisthesis    Toxic maculopathy    Valvular heart disease    a.) TTE 07/06/2015: EF 55-60%; Mild MR/AR/TR; Mild AS with MPG 13.0 mmHg.    Past Surgical History:  Procedure Laterality Date   APPENDECTOMY     APPLICATION OF INTRAOPERATIVE CT SCAN N/A 05/23/2021   Procedure: APPLICATION OF INTRAOPERATIVE CT SCAN;  Surgeon: Meade Maw, MD;  Location: [facility];  Service: Neurosurgery;  Laterality: N/A;   BACK SURGERY     lumbar fusion   BUNIONECTOMY Right    CATARACT EXTRACTION, BILATERAL     CESAREAN SECTION     x1   CHOLECYSTECTOMY N/A 05/11/2016   Procedure: LAPAROSCOPIC CHOLECYSTECTOMY;  Surgeon: Florene Glen, MD;  Location: [facility];  Service: General;  Laterality: N/A;   COLONOSCOPY WITH PROPOFOL N/A 04/02/2016   Procedure: COLONOSCOPY WITH PROPOFOL;  Surgeon: Jonathon Bellows, MD;  Location: [facility];  Service: Endoscopy;  Laterality: N/A;   ENDOSCOPIC RETROGRADE CHOLANGIOPANCREATOGRAPHY (ERCP) WITH PROPOFOL N/A 05/08/2016   Procedure: ENDOSCOPIC RETROGRADE CHOLANGIOPANCREATOGRAPHY (ERCP) WITH PROPOFOL;  Surgeon: Lucilla Lame, MD;  Location: [facility];  Service: Endoscopy;  Laterality: N/A;   ERCP     with biliary spincterotomy 05/08/16 Dr. Allen Norris for choledocholithiasis    ESOPHAGEAL DILATION  04/02/2016   Procedure: ESOPHAGEAL DILATION;  Surgeon: Jonathon Bellows, MD;  Location: [facility];  Service: Endoscopy;;   ESOPHAGOGASTRODUODENOSCOPY (EGD) WITH PROPOFOL N/A 04/02/2016   Procedure: ESOPHAGOGASTRODUODENOSCOPY (EGD) WITH PROPOFOL;  Surgeon: Jonathon Bellows, MD;  Location: [facility];  Service: Endoscopy;  Laterality: N/A;   HIP ARTHROPLASTY Right 11/01/2016   Procedure: ARTHROPLASTY BIPOLAR HIP (HEMIARTHROPLASTY);  Surgeon: Corky Mull, MD;  Location: [facility];  Service: Orthopedics;  Laterality: Right;   LUMBAR LAMINECTOMY/DECOMPRESSION MICRODISCECTOMY N/A 12/11/2020   Procedure: LEFT L2-3  MICRODISCECTOMY, L3-4 AND L5-S1 DECOMPRESSION;  Surgeon: Meade Maw, MD;  Location: [facility];  Service: Neurosurgery;  Laterality: N/A;   NEPHRECTOMY  1988   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   ORIF SCAPHOID FRACTURE Left 12/21/2019   Procedure: OPEN REDUCTION INTERNAL FIXATION (ORIF) OF LEFT SCAPULAR NONUNION WITH BONE GRAFT;  Surgeon: Corky Mull, MD;  Location: [facility];  Service: Orthopedics;  Laterality: Left;   osteoporosis     noted DEXA 08/19/16    REPLACEMENT TOTAL KNEE Right    REVERSE SHOULDER ARTHROPLASTY Right 11/04/2017   Procedure: REVERSE SHOULDER ARTHROPLASTY;  Surgeon: Corky Mull, MD;  Location: [facility];  Service: Orthopedics;  Laterality: Right;   REVERSE SHOULDER ARTHROPLASTY Left 07/26/2018   Procedure: REVERSE SHOULDER ARTHROPLASTY;  Surgeon: Corky Mull, MD;  Location: [facility];  Service: Orthopedics;  Laterality: Left;   TONSILLECTOMY     TOTAL HIP ARTHROPLASTY  12/10/11   [facility] left hip   TOTAL HIP  ARTHROPLASTY Bilateral    TRANSFORAMINAL LUMBAR INTERBODY FUSION (TLIF) WITH PEDICLE SCREW FIXATION 3 LEVEL N/A 05/23/2021   Procedure: OPEN L2-4 TRANSFORAMINAL LUMBAR INTERBODY FUSION (TLIF) WITH L2-5 POSTERIOR SPINAL FUSION;  Surgeon: Meade Maw, MD;  Location: [facility];  Service: Neurosurgery;  Laterality: N/A;   TUBAL LIGATION       Current Outpatient Medications:    Adalimumab 40 MG/0.4ML PNKT, Inject 40 mg into the skin every 14 (fourteen) days., Disp: , Rfl:    amLODipine (NORVASC) 2.5 MG tablet, Take 2 tablets (5 mg total) by mouth 2 (two) times daily., Disp: , Rfl:    busPIRone (BUSPAR) 5 MG tablet, Take 1 tablet (5 mg total) by mouth 2 (two) times daily., Disp: , Rfl:    Cholecalciferol (VITAMIN D3) 50 MCG (2000 UT) TABS, Take 4,000 Units by mouth daily., Disp: , Rfl:    clopidogrel (PLAVIX) 75 MG tablet, Take 75 mg by mouth daily., Disp: , Rfl:    cyclobenzaprine (FLEXERIL) 10 MG tablet, Take 1 tablet (10 mg  total) by mouth 3 (three) times daily as needed for muscle spasms., Disp: 30 tablet, Rfl: 0   dicyclomine (BENTYL) 10 MG capsule, Take 1 capsule (10 mg total) by mouth 2 (two) times daily as needed for spasms. (Patient taking differently: Take 20 mg by mouth 2 (two) times daily.), Disp: 180 capsule, Rfl: 3   escitalopram (LEXAPRO) 10 MG tablet, TAKE 1 TABLET BY MOUTH EVERY DAY, Disp: 90 tablet, Rfl: 1   etodolac (LODINE) 500 MG tablet, Take 500 mg by mouth daily., Disp: , Rfl:    famotidine (PEPCID) 20 MG tablet, TAKE 1 TABLET (20 MG TOTAL) BY MOUTH DAILY. BEFORE BREAKFAST OR DINNER, Disp: 90 tablet, Rfl: 1   fluconazole (DIFLUCAN) 150 MG tablet, TAKE 1 TABLET (150 MG TOTAL) BY MOUTH ONCE FOR 1 DOSE. REPEAT 2ND DOSE IN 3-7 DAYS AS NEEDED, Disp: 3 tablet, Rfl: 0   fluticasone (FLONASE) 50 MCG/ACT nasal spray, Place 2 sprays into both nostrils daily. In am, Disp: , Rfl:    furosemide (LASIX) 40 MG tablet, Take 0.5 tablets (20 mg total) by mouth daily., Disp: 30 tablet, Rfl:    gabapentin (NEURONTIN) 300 MG capsule, Take 2 capsules (600 mg total) by mouth in the morning and at bedtime., Disp: 360 capsule, Rfl: 3   lamoTRIgine (LAMICTAL) 100 MG tablet, TAKE 1 TAB 2 TIMES DAILY. FURTHER REFILLS NEW PSYCH FOR ALL PSYCH MEDS ONLY TEMP SUPPLY FROM PCP, Disp: 180 tablet, Rfl: 1   leflunomide (ARAVA) 20 MG tablet, Take 1 tablet (20 mg total) by mouth daily., Disp: , Rfl:    lidocaine (LIDODERM) 5 %, Place 1 patch onto the skin 2 (two) times daily as needed. Remove & Discard patch within 12 hours or as directed by MD (Patient taking differently: Place 1 patch onto the skin daily. Remove & Discard patch within 12 hours or as directed by MD), Disp: 120 patch, Rfl: 2   lovastatin (MEVACOR) 20 MG tablet, TAKE 1 TABLET BY MOUTH EVERYDAY AT BEDTIME *STOP TALKING '40MG'$ * (Patient taking differently: Take 20 mg by mouth at bedtime.), Disp: 90 tablet, Rfl: 3   methocarbamol (ROBAXIN) 500 MG tablet, Take 500 mg by mouth 4  (four) times daily as needed., Disp: , Rfl:    mirabegron ER (MYRBETRIQ) 50 MG TB24 tablet, Take 1 tablet (50 mg total) by mouth daily., Disp: 90 tablet, Rfl: 3   montelukast (SINGULAIR) 10 MG tablet, TAKE 1 TABLET BY MOUTH EVERY DAY, Disp: 90 tablet,  Rfl: 1   multivitamin-lutein (OCUVITE-LUTEIN) CAPS capsule, Take 1 capsule by mouth at bedtime., Disp: , Rfl:    nebivolol (BYSTOLIC) 10 MG tablet, Take 0.5 tablets (5 mg total) by mouth in the morning and at bedtime., Disp: , Rfl:    OXYGEN, Inhale 2 L into the lungs at bedtime., Disp: , Rfl:    predniSONE (DELTASONE) 5 MG tablet, Take 1 tablet (5 mg total) by mouth daily with breakfast., Disp: 45 tablet, Rfl: 1   QUEtiapine (SEROQUEL) 25 MG tablet, TAKE 1 TABLET (25 MG TOTAL) BY MOUTH AT BEDTIME. AGAIN LAST FILL FURTHER REFILLS FROM PSYCHIATRY NO EXCEPTIONS, Disp: 90 tablet, Rfl: 0   sucralfate (CARAFATE) 1 g tablet, Take 1 tablet (1 g total) by mouth 2 (two) times daily., Disp: 180 tablet, Rfl: 3   zoledronic acid (RECLAST) 5 MG/100ML SOLN injection, Inject 5 mg into the vein once., Disp: , Rfl:    albuterol (VENTOLIN HFA) 108 (90 Base) MCG/ACT inhaler, Inhale 2 puffs into the lungs every 6 (six) hours as needed for wheezing or shortness of breath., Disp: 18 g, Rfl: 11   budesonide (PULMICORT) 0.25 MG/2ML nebulizer solution, USE 1 VIAL  IN  NEBULIZER TWICE  DAILY - rinse mouth after treatment, Disp: 60 mL, Rfl: 11   ipratropium-albuterol (DUONEB) 0.5-2.5 (3) MG/3ML SOLN, Take 3 mLs by nebulization 3 (three) times daily as needed., Disp: 360 mL, Rfl: 3  EXAM:  VITALS per patient if applicable:  GENERAL: alert, oriented, appears well and in no acute distress  PSYCH/NEURO: pleasant and cooperative, no obvious depression or anxiety, speech and thought processing grossly intact  ASSESSMENT AND PLAN:  Discussed the following assessment and plan: HFU  05/23/21 to 06/02/21 for low back surgery this is 3rd surgery then had acute respiratory failure  with hypoxia with acute on chronic chf required 15 L high flow O2 and bipap, LLL pneumonia she is feeling better less sob back on 2L O2 qhs and Afib in hospital now hr in in the 80s. She did go to [facility] after discharge doing well no pain (back, legs, buttocks  Acute on chronic respiratory failure multifactorial due asthma/copd, nsip doing better back on O2 2L qhs  AKI (acute kidney injury) ([place]) could be due to a/c combined chf, infection while hopsitalized- Plan: Comprehensive metabolic panel  Anemia, unspecified type - Plan: CBC with Differential/Platelet, Pathologist smear review Leukopenia, unspecified type - Plan: CBC with Differential/Platelet, Pathologist smear review Thrombocytosis - Plan: CBC with Differential/Platelet, Pathologist smear review, TSH  Abnormal thyroid function test - Plan: TSH  Cough - Plan: albuterol (VENTOLIN HFA) 108 (90 Base) MCG/ACT inhaler, ipratropium-albuterol (DUONEB) 0.5-2.5 (3) MG/3ML SOLN Asthma-COPD overlap syndrome (HCC) - Plan: albuterol (VENTOLIN HFA) 108 (90 Base) MCG/ACT inhaler, ipratropium-albuterol (DUONEB) 0.5-2.5 (3) MG/3ML SOLN NSIP (nonspecific interstitial pneumonitis) (HCC) - Plan: albuterol (VENTOLIN HFA) 108 (90 Base) MCG/ACT inhaler, ipratropium-albuterol (DUONEB) 0.5-2.5 (3) MG/3ML SOLN  anxiety As of end of 04/2021 off xanax   [employer] flu shot  utd  prevnar, pna 23 utd allergic to Tdap declines shingrix  covid 19 vaccine has not had due to allergies   mammo  08/06/20 negative call to schedule order in   Colonoscopy 04/02/16 multiple polyps but tortuous colon GI does not want to do any further    Out of age window pap    DEXA 08/19/16 +osteoporosis on Forteo will need to repeat 1 year after Forteo use to see improvement rheumatology following [place].  -DEXA 10/12/17 with T score -4.1  worse on forteo unable to do any further bone densities per pt 07/07/2018 due to h/o multiple ortho surgeries  +Osteoporosis  Off forteo was on x 2  years F/u [facility] rheumatology Dr. Jefm Bryant reclast upcoming 2021    Seeing dermatology in [place] changed referral [facility] seen 12/01/18 to get closer to home and due for f/u 05/2019 will encourage pt to call  To schedule appt  Dr. Kellie Moor 11/2019   Dr. Rosine Door Retinal specialist 08/2017, was scheduled 12/2018, 04/24/19, 05/22/19 injection 09/2019 Q 8 weeks    Consider h/o in future with persistent leukocytosis      Echo 11/22/18 [facility]  INTERPRETATION    NORMAL LEFT VENTRICULAR SYSTOLIC FUNCTION WITH MILD LVH    NORMAL RIGHT VENTRICULAR SYSTOLIC FUNCTION    VALVULAR REGURGITATION: MODERATE AR, MODERATE MR, TRIVIAL TR    NO VALVULAR STENOSIS     repeat ech 10/06/19 echo [facility]   NORMAL LEFT VENTRICULAR SYSTOLIC FUNCTION    NORMAL RIGHT VENTRICULAR SYSTOLIC FUNCTION    VALVULAR REGURGITATION: MILD AR, MILD MR, TRIVIAL TR    NO VALVULAR STENOSIS    AORTIC SCLEROSIS    LIMITED ACOUSTIC WINDOWS     Renal 07/17/21 Dr. Candiss Norse   -we discussed possible serious and likely etiologies, options for evaluation and workup, limitations of telemedicine visit vs in person visit, treatment, treatment risks and precautions. Pt is agreeable to treatment via telemedicine at this moment.   I discussed the assessment and treatment plan with the patient. The patient was provided an opportunity to ask questions and all were answered. The patient agreed with the plan and demonstrated an understanding of the instructions.    Time spent 20 minutes   Delorise Jackson, MD

## 2021-06-26 ENCOUNTER — Ambulatory Visit: Payer: Medicare PPO | Admitting: Pulmonary Disease

## 2021-06-26 ENCOUNTER — Encounter: Payer: Self-pay | Admitting: Pulmonary Disease

## 2021-06-26 VITALS — BP 124/76 | HR 83 | Temp 98.0°F | Ht 61.0 in | Wt 175.0 lb

## 2021-06-26 DIAGNOSIS — J8489 Other specified interstitial pulmonary diseases: Secondary | ICD-10-CM

## 2021-06-26 DIAGNOSIS — Z8701 Personal history of pneumonia (recurrent): Secondary | ICD-10-CM | POA: Diagnosis not present

## 2021-06-26 DIAGNOSIS — G4736 Sleep related hypoventilation in conditions classified elsewhere: Secondary | ICD-10-CM

## 2021-06-26 DIAGNOSIS — I080 Rheumatic disorders of both mitral and aortic valves: Secondary | ICD-10-CM | POA: Diagnosis not present

## 2021-06-26 DIAGNOSIS — J449 Chronic obstructive pulmonary disease, unspecified: Secondary | ICD-10-CM | POA: Diagnosis not present

## 2021-06-26 DIAGNOSIS — M0579 Rheumatoid arthritis with rheumatoid factor of multiple sites without organ or systems involvement: Secondary | ICD-10-CM | POA: Diagnosis not present

## 2021-06-26 NOTE — Patient Instructions (Signed)
Use your DuoNeb (ipratropium/albuterol) nebulizer at least 3 times a day.  You can use it up to 4 times a day.  Continue to use your Pulmicort twice a day.  Sure to make an appointment with your cardiologist.  We will see you in follow-up in 6 to 8 weeks time we will get chest x-ray on the day of follow-up.  Call sooner should any new problems arise.

## 2021-06-26 NOTE — Progress Notes (Signed)
Subjective:    Patient ID: Kerri Carter, female    DOB: 11/23/1940, 81 y.o.   MRN: [id number] Patient Care Team: McLean-Scocuzza, Nino Glow, MD as PCP - General (Internal Medicine)  Chief Complaint  Patient presents with   Follow-up    Recent admission for PNA after back surgery. 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  This is a scheduled visit.  She had a left elective lumbar fusion extension by Dr. Cari Caraway on 23 May 2021.  She developed acute hypoxia and respiratory distress requiring BiPAP on 24 April with consultation with PCCM.  She improved with diuresis as her issues were due to acute systolic heart failure and acute aspiration pneumonitis.  She has done better since that episode.  She continues to have issues with dyspnea which are chronic for her but she admits that she is not using her nebulizers as instructed previously.  She was reminded to use her nebulizer medications at the very least 3 times a day.  She has not followed up with cardiology since her last admission.  It is also recommended that she follow-up with cardiology.  Since her admission she has not had any fevers, chills or sweats.  Cough has been nonproductive and at baseline.  No hemoptysis.  No chest pain, orthopnea or paroxysmal nocturnal dyspnea.  She is using her oxygen nocturnally at 2 L/min and notes that this helps her.  She does not endorse any other symptomatology.   Review of Systems A 10 point review of systems was performed and it is as noted above otherwise negative.  Patient Active Problem List   Diagnosis Date Noted   Thrombocytosis 06/24/2021   Leukopenia 06/24/2021   Abnormal thyroid function test 06/24/2021   Acute on chronic combined systolic and diastolic CHF (congestive heart failure) ([place]) 05/26/2021   Left  lower lobe pneumonia 05/26/2021   Elevated d-dimer 05/26/2021   Acute kidney injury superimposed on chronic kidney disease ([place]) 05/26/2021   Hyperkalemia 05/26/2021   Anxiety and depression 05/26/2021   S/P lumbar fusion 05/23/2021   Physical deconditioning 02/27/2021   Leg pain, bilateral    Respiratory failure with hypoxia ([place]) 01/12/2021   Acute respiratory failure with hypoxia ([place]) 01/12/2021   Frequent falls 12/18/2020   Hip pain 12/18/2020   Acute pain of left knee 12/18/2020   Elevated brain natriuretic peptide (BNP) level 12/18/2020   Weakness of both lower extremities 12/18/2020   Assistance needed for mobility 12/18/2020   Impaired mobility 12/18/2020   Urinary incontinence 12/18/2020   Pleural effusion on left 12/18/2020   Lumbar radiculopathy 12/11/2020   COPD exacerbation ([place]) 10/29/2020   Acromial process of scapula fracture 12/21/2019   NSIP (nonspecific interstitial pneumonitis) ([place]) 12/08/2019   Iliopsoas bursitis of right hip 08/29/2019   Chronic left shoulder pain 08/29/2019   Overweight (BMI 25.0-29.9) 08/29/2019   Hypertriglyceridemia 07/24/2019   Bipolar disorder, in full remission, most recent episode depressed ([place]) 07/13/2019   Essential hypertension 06/16/2019   Chronic pain 06/16/2019   Lymphedema 06/05/2019   Chronic venous insufficiency 05/25/2019   PAD (peripheral artery disease) ([place]) 05/25/2019   Leg edema 05/02/2019   Episodic mood disorder ([place]) 42/35/3614   Complicated grief 43/15/4008   At high risk for falls 03/16/2019   Pelvic mass in female 03/16/2019   Compression  fracture of L4 vertebra ([place]) 02/21/2019   Collapsed vertebra, not elsewhere classified, lumbar region, initial encounter for fracture ([place]) 02/21/2019   Anxiety 01/20/2019   Pain due to onychomycosis of toenails of both feet 12/05/2018   Hav (hallux abducto valgus), unspecified laterality 12/05/2018   Closed fracture of acromial process of scapula 10/28/2018   Status  post reverse arthroplasty of shoulder, left 07/26/2018   Interstitial pulmonary disease ([place]) 07/07/2018   Fatigue 07/07/2018   Avascular necrosis of left shoulder due to adverse effect of steroid therapy ([place]) 01/20/2018   Other shoulder lesions, left shoulder 12/20/2017   Status post reverse total shoulder replacement, right 11/04/2017   Leukocytosis 10/19/2017   Allergic rhinitis 09/28/2017   Rotator cuff tendinitis, right 07/26/2017   Strain of right hip 02/26/2017   History of fracture of left hip 01/15/2017   Status post lumbar spinal fusion 01/15/2017   Dislocation of hip joint prosthesis ([place]) 01/08/2017   Status post hip hemiarthroplasty 01/08/2017   Leg swelling 12/15/2016   Age-related osteoporosis without current pathological fracture 11/05/2016   Hip fracture ([place]) 10/31/2016   Peripheral neuropathy 10/28/2016   Left foot pain 10/19/2016   Spinal stenosis of lumbar region 09/15/2016   Osteoporosis 08/27/2016   Drug-induced osteoporosis 08/27/2016   Adnexal mass 08/25/2016   Prediabetes 08/25/2016   Coronary atherosclerosis 08/25/2016   History of syncope 08/25/2016   Hypokalemia 08/25/2016   Mixed bipolar I disorder ([place]) 10/09/2015   Stage 3b chronic kidney disease ([place])    Anemia 08/15/2015   Conversion disorder 08/04/2015   Hyperlipidemia 07/17/2015   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   Macular degeneration 07/17/2015   Insomnia 07/17/2015   Rheumatoid arthritis ([place]) 12/05/2014   Other specified postprocedural states 07/20/2013   Osteoarthritis of knee 06/07/2013   Cough 05/02/2012   SOB (shortness of breath) 10/13/2011   Valvular heart disease 10/13/2011   Chronic obstructive pulmonary disease (COPD) ([place]) 09/09/2011   OSA (obstructive sleep apnea) 09/09/2011   Nocturnal hypoxemia 09/09/2011   Gastroesophageal reflux disease 02/24/2011   Single kidney 02/24/2011   H/O cardiac catheterization 02/24/2011   Renal artery  stenosis ([place]) 02/24/2011   Social History   Tobacco Use   Smoking status: Former    Packs/day: 0.50    Years: 20.00    Pack years: 10.00    Types: Cigarettes    Quit date: 02/02/1974    Years since quitting: 47.4   Smokeless tobacco: Never  Substance Use Topics   Alcohol use: No   Allergies  Allergen Reactions   Ceftin [Cefuroxime Axetil] Anaphylaxis   Lisinopril Anaphylaxis   Sulfa Antibiotics Other (See Comments)    Face swelling   Sulfasalazine Anaphylaxis   Morphine Other (See Comments)    Per patient, low blood pressure issues that requires action to raise it back up. Can take small infrequent doses   Xarelto [Rivaroxaban] Other (See Comments)    Stomach burning, bleeding, and tar in stool   Adhesive [Tape] Rash    Paper tape and tega derm OK   Antihistamines, Chlorpheniramine-Type Other (See Comments)    Makes pt hyper   Antivert [Meclizine Hcl] Other (See Comments)    Bladder will not empty   Aspirin Other (See Comments)    Sulfasalazine allergy cross reacts   Contrast Media [Iodinated Contrast Media] Rash    she is able to use betadine scrubs.   Decongestant [Pseudoephedrine Hcl] Other (See Comments)    Makes pt hyper   Doxycycline Other (  See Comments)    GI upset   Levaquin [Levofloxacin In D5w] Rash   Polymyxin B Other (See Comments)    Facial rash   Tetanus Toxoids Rash and Other (See Comments)    Fever and hot to touch at injection site   Current Meds  Medication Sig   Adalimumab 40 MG/0.4ML PNKT Inject 40 mg into the skin every 14 (fourteen) days.   albuterol (VENTOLIN HFA) 108 (90 Base) MCG/ACT inhaler Inhale 2 puffs into the lungs every 6 (six) hours as needed for wheezing or shortness of breath.   amLODipine (NORVASC) 2.5 MG tablet Take 2 tablets (5 mg total) by mouth 2 (two) times daily.   budesonide (PULMICORT) 0.25 MG/2ML nebulizer solution USE 1 VIAL  IN  NEBULIZER TWICE  DAILY - rinse mouth after treatment   busPIRone (BUSPAR) 5 MG tablet Take  1 tablet (5 mg total) by mouth 2 (two) times daily.   Cholecalciferol (VITAMIN D3) 50 MCG (2000 UT) TABS Take 4,000 Units by mouth daily.   clopidogrel (PLAVIX) 75 MG tablet Take 75 mg by mouth daily.   cyclobenzaprine (FLEXERIL) 10 MG tablet Take 1 tablet (10 mg total) by mouth 3 (three) times daily as needed for muscle spasms.   dicyclomine (BENTYL) 10 MG capsule Take 1 capsule (10 mg total) by mouth 2 (two) times daily as needed for spasms. (Patient taking differently: Take 20 mg by mouth 2 (two) times daily.)   escitalopram (LEXAPRO) 10 MG tablet TAKE 1 TABLET BY MOUTH EVERY DAY   etodolac (LODINE) 500 MG tablet Take 500 mg by mouth daily.   famotidine (PEPCID) 20 MG tablet TAKE 1 TABLET (20 MG TOTAL) BY MOUTH DAILY. BEFORE BREAKFAST OR DINNER   fluticasone (FLONASE) 50 MCG/ACT nasal spray Place 2 sprays into both nostrils daily. In am   furosemide (LASIX) 40 MG tablet Take 0.5 tablets (20 mg total) by mouth daily.   gabapentin (NEURONTIN) 300 MG capsule Take 2 capsules (600 mg total) by mouth in the morning and at bedtime.   ipratropium-albuterol (DUONEB) 0.5-2.5 (3) MG/3ML SOLN Take 3 mLs by nebulization 3 (three) times daily as needed.   lamoTRIgine (LAMICTAL) 100 MG tablet TAKE 1 TAB 2 TIMES DAILY. FURTHER REFILLS NEW PSYCH FOR ALL PSYCH MEDS ONLY TEMP SUPPLY FROM PCP   leflunomide (ARAVA) 20 MG tablet Take 1 tablet (20 mg total) by mouth daily.   lidocaine (LIDODERM) 5 % Place 1 patch onto the skin 2 (two) times daily as needed. Remove & Discard patch within 12 hours or as directed by MD (Patient taking differently: Place 1 patch onto the skin daily. Remove & Discard patch within 12 hours or as directed by MD)   lovastatin (MEVACOR) 20 MG tablet TAKE 1 TABLET BY MOUTH EVERYDAY AT BEDTIME *STOP TALKING '40MG'$ * (Patient taking differently: Take 20 mg by mouth at bedtime.)   methocarbamol (ROBAXIN) 500 MG tablet Take 500 mg by mouth 4 (four) times daily as needed.   mirabegron ER (MYRBETRIQ) 50  MG TB24 tablet Take 1 tablet (50 mg total) by mouth daily.   montelukast (SINGULAIR) 10 MG tablet TAKE 1 TABLET BY MOUTH EVERY DAY   multivitamin-lutein (OCUVITE-LUTEIN) CAPS capsule Take 1 capsule by mouth at bedtime.   nebivolol (BYSTOLIC) 10 MG tablet Take 0.5 tablets (5 mg total) by mouth in the morning and at bedtime.   OXYGEN Inhale 2 L into the lungs at bedtime.   predniSONE (DELTASONE) 5 MG tablet Take 1 tablet (5 mg total) by mouth  daily with breakfast.   QUEtiapine (SEROQUEL) 25 MG tablet TAKE 1 TABLET (25 MG TOTAL) BY MOUTH AT BEDTIME. AGAIN LAST FILL FURTHER REFILLS FROM PSYCHIATRY NO EXCEPTIONS   sucralfate (CARAFATE) 1 g tablet Take 1 tablet (1 g total) by mouth 2 (two) times daily.   zoledronic acid (RECLAST) 5 MG/100ML SOLN injection Inject 5 mg into the vein once.   Immunization History  Administered Date(s) Administered   Fluad Quad(high Dose 65+) 10/03/2018, 10/16/2019, 11/22/2020   Influenza Split 10/04/2012, 10/25/2013, 10/28/2016   Influenza Whole 11/03/2011   Influenza, High Dose Seasonal PF 10/04/2015, 11/03/2016   Influenza,inj,Quad PF,6+ Mos 10/31/2014   Influenza-Unspecified 01/02/2010, 11/24/2011, 10/21/2013, 10/31/2014, 09/28/2017   Pneumococcal Conjugate-13 10/21/2013   Pneumococcal Polysaccharide-23 11/07/2015       Objective:   Physical Exam BP 124/76 (BP Location: Left Arm, Cuff Size: Normal)   Pulse 83   Temp 98 F (36.7 C) (Temporal)   Ht '5\' 1"'$  (1.549 m)   Wt 175 lb (79.4 kg)   SpO2 96%   BMI 33.07 kg/m  GENERAL: Well-developed, well-nourished elderly woman, she presents on transport chair today.   HEAD: Normocephalic, atraumatic. EYES: Pupils equal, round, reactive to light.  No scleral icterus. MOUTH: Oral mucosa moist.  No thrush. NECK: Supple. No thyromegaly. Trachea midline. No JVD.  No adenopathy. PULMONARY: Coarse breath sounds throughout, no wheezes noted.  CARDIOVASCULAR: S1 and S2. Regular rate and rhythm.  Grade 5-7/8 systolic  ejection murmur left sternal border, seem more prominent. GASTROINTESTINAL: Benign MUSCULOSKELETAL: Mild changes of RA both hands, no clubbing, no edema.  Limited range of motion of joints due to RA. NEUROLOGIC: No overt focal deficits.  Awake, alert, speech is fluent. SKIN: Intact,warm,dry. PSYCH: Mood and behavior appropriate     Assessment & Plan:     ICD-10-CM   1. Asthma-COPD overlap syndrome (HCC)  J44.9    Appears to be well compensated on her current regimen Continue nebulization treatments Instructed on proper frequency use of nebulizer medications    2. NSIP (nonspecific interstitial pneumonitis) (HCC)  J84.89    This is associated with her rheumatoid arthritis Appears compensated    3. History of recent pneumonia  Z87.01 DG Chest 2 View   Appears to be doing better clinically Follow-up chest x-ray on return appointment    4. Nocturnal hypoxemia due to obstructive chronic bronchitis (HCC)  J44.9    G47.36    Compliant with oxygen at 2 L/min Continue nocturnal oxygen    5. Rheumatoid arthritis involving multiple sites with positive rheumatoid factor (HCC)  M05.79    This issue adds complexity to her management vis--vis NSIP    6. Mitral and aortic valve regurgitation  I08.0    This issue adds complexity to her management Followed by cardiology Patient encouraged to follow-up with cardiology     Orders Placed This Encounter  Procedures   DG Chest 2 View    Standing Status:   Future    Standing Expiration Date:   12/27/2021    Order Specific Question:   Reason for Exam (SYMPTOM  OR DIAGNOSIS REQUIRED)    Answer:   f/u PNA    Order Specific Question:   Preferred imaging location?    Answer:   [facility]   We will see the patient in follow-up in 6 to 8 weeks time she is to contact us prior to that time should any new difficulties arise.  Renold Don, MD Advanced Bronchoscopy PCCM [facility]    *This  note was dictated using  voice recognition software/Dragon.  Despite best efforts to proofread, errors can occur which can change the meaning. Any transcriptional errors that result from this process are unintentional and may not be fully corrected at the time of dictation.

## 2021-07-01 DIAGNOSIS — F3176 Bipolar disorder, in full remission, most recent episode depressed: Secondary | ICD-10-CM | POA: Diagnosis not present

## 2021-07-01 DIAGNOSIS — F419 Anxiety disorder, unspecified: Secondary | ICD-10-CM | POA: Diagnosis not present

## 2021-07-03 ENCOUNTER — Other Ambulatory Visit (INDEPENDENT_AMBULATORY_CARE_PROVIDER_SITE_OTHER): Payer: Medicare PPO

## 2021-07-03 DIAGNOSIS — D72819 Decreased white blood cell count, unspecified: Secondary | ICD-10-CM | POA: Diagnosis not present

## 2021-07-03 DIAGNOSIS — D75839 Thrombocytosis, unspecified: Secondary | ICD-10-CM

## 2021-07-03 DIAGNOSIS — R946 Abnormal results of thyroid function studies: Secondary | ICD-10-CM | POA: Diagnosis not present

## 2021-07-03 DIAGNOSIS — D649 Anemia, unspecified: Secondary | ICD-10-CM | POA: Diagnosis not present

## 2021-07-03 DIAGNOSIS — N179 Acute kidney failure, unspecified: Secondary | ICD-10-CM

## 2021-07-04 LAB — CBC WITH DIFFERENTIAL/PLATELET
Basophils Absolute: 0.5 10*3/uL — ABNORMAL HIGH (ref 0.0–0.1)
Basophils Relative: 3.5 % — ABNORMAL HIGH (ref 0.0–3.0)
Eosinophils Absolute: 0.5 10*3/uL (ref 0.0–0.7)
Eosinophils Relative: 3.5 % (ref 0.0–5.0)
HCT: 33 % — ABNORMAL LOW (ref 36.0–46.0)
Hemoglobin: 10.4 g/dL — ABNORMAL LOW (ref 12.0–15.0)
Lymphocytes Relative: 33 % (ref 12.0–46.0)
Lymphs Abs: 4.8 10*3/uL — ABNORMAL HIGH (ref 0.7–4.0)
MCHC: 31.4 g/dL (ref 30.0–36.0)
MCV: 82.4 fl (ref 78.0–100.0)
Monocytes Absolute: 1.1 10*3/uL — ABNORMAL HIGH (ref 0.1–1.0)
Monocytes Relative: 7.3 % (ref 3.0–12.0)
Neutro Abs: 7.7 10*3/uL (ref 1.4–7.7)
Neutrophils Relative %: 52.7 % (ref 43.0–77.0)
Platelets: 313 10*3/uL (ref 150.0–400.0)
RBC: 4.01 Mil/uL (ref 3.87–5.11)
RDW: 15.5 % (ref 11.5–15.5)
WBC: 14.6 10*3/uL — ABNORMAL HIGH (ref 4.0–10.5)

## 2021-07-04 LAB — COMPREHENSIVE METABOLIC PANEL
ALT: 14 U/L (ref 0–35)
AST: 19 U/L (ref 0–37)
Albumin: 3.9 g/dL (ref 3.5–5.2)
Alkaline Phosphatase: 111 U/L (ref 39–117)
BUN: 19 mg/dL (ref 6–23)
CO2: 25 mEq/L (ref 19–32)
Calcium: 9.4 mg/dL (ref 8.4–10.5)
Chloride: 103 mEq/L (ref 96–112)
Creatinine, Ser: 1.41 mg/dL — ABNORMAL HIGH (ref 0.40–1.20)
GFR: 35.66 mL/min — ABNORMAL LOW (ref >60.00)
Glucose, Bld: 118 mg/dL — ABNORMAL HIGH (ref 70–99)
Potassium: 4.3 mEq/L (ref 3.5–5.1)
Sodium: 142 mEq/L (ref 135–145)
Total Bilirubin: 0.5 mg/dL (ref 0.2–1.2)
Total Protein: 7.3 g/dL (ref 6.0–8.3)

## 2021-07-04 LAB — TSH: TSH: 2.53 u[IU]/mL (ref 0.35–5.50)

## 2021-07-04 LAB — PATHOLOGIST SMEAR REVIEW

## 2021-07-07 ENCOUNTER — Other Ambulatory Visit: Payer: Self-pay | Admitting: Internal Medicine

## 2021-07-13 DIAGNOSIS — D72820 Lymphocytosis (symptomatic): Secondary | ICD-10-CM

## 2021-07-13 HISTORY — DX: Lymphocytosis (symptomatic): D72.820

## 2021-07-13 NOTE — Addendum Note (Signed)
Addended by: Orland Mustard on: 07/13/2021 09:37 PM   Modules accepted: Orders

## 2021-07-14 DIAGNOSIS — H353122 Nonexudative age-related macular degeneration, left eye, intermediate dry stage: Secondary | ICD-10-CM | POA: Diagnosis not present

## 2021-07-14 DIAGNOSIS — H16142 Punctate keratitis, left eye: Secondary | ICD-10-CM | POA: Diagnosis not present

## 2021-07-14 DIAGNOSIS — H43813 Vitreous degeneration, bilateral: Secondary | ICD-10-CM | POA: Diagnosis not present

## 2021-07-14 DIAGNOSIS — H353211 Exudative age-related macular degeneration, right eye, with active choroidal neovascularization: Secondary | ICD-10-CM | POA: Diagnosis not present

## 2021-07-14 DIAGNOSIS — H04123 Dry eye syndrome of bilateral lacrimal glands: Secondary | ICD-10-CM | POA: Diagnosis not present

## 2021-07-14 DIAGNOSIS — H3554 Dystrophies primarily involving the retinal pigment epithelium: Secondary | ICD-10-CM | POA: Diagnosis not present

## 2021-07-14 DIAGNOSIS — H5213 Myopia, bilateral: Secondary | ICD-10-CM | POA: Diagnosis not present

## 2021-07-14 DIAGNOSIS — H35363 Drusen (degenerative) of macula, bilateral: Secondary | ICD-10-CM | POA: Diagnosis not present

## 2021-07-14 DIAGNOSIS — D3131 Benign neoplasm of right choroid: Secondary | ICD-10-CM | POA: Diagnosis not present

## 2021-07-15 DIAGNOSIS — I480 Paroxysmal atrial fibrillation: Secondary | ICD-10-CM | POA: Diagnosis not present

## 2021-07-15 DIAGNOSIS — Z79899 Other long term (current) drug therapy: Secondary | ICD-10-CM | POA: Diagnosis not present

## 2021-07-15 DIAGNOSIS — R4189 Other symptoms and signs involving cognitive functions and awareness: Secondary | ICD-10-CM | POA: Diagnosis not present

## 2021-07-15 DIAGNOSIS — G4733 Obstructive sleep apnea (adult) (pediatric): Secondary | ICD-10-CM | POA: Diagnosis not present

## 2021-07-15 DIAGNOSIS — R2689 Other abnormalities of gait and mobility: Secondary | ICD-10-CM | POA: Diagnosis not present

## 2021-07-15 DIAGNOSIS — Z9989 Dependence on other enabling machines and devices: Secondary | ICD-10-CM | POA: Diagnosis not present

## 2021-07-15 DIAGNOSIS — M5136 Other intervertebral disc degeneration, lumbar region: Secondary | ICD-10-CM | POA: Diagnosis not present

## 2021-07-15 DIAGNOSIS — G459 Transient cerebral ischemic attack, unspecified: Secondary | ICD-10-CM | POA: Diagnosis not present

## 2021-07-17 ENCOUNTER — Emergency Department: Payer: Medicare PPO

## 2021-07-17 ENCOUNTER — Emergency Department
Admission: EM | Admit: 2021-07-17 | Discharge: 2021-07-17 | Disposition: A | Discharge status: Home / Self Care | Payer: Medicare PPO | Attending: Emergency Medicine | Admitting: Emergency Medicine

## 2021-07-17 DIAGNOSIS — M25511 Pain in right shoulder: Secondary | ICD-10-CM | POA: Diagnosis not present

## 2021-07-17 DIAGNOSIS — S4991XA Unspecified injury of right shoulder and upper arm, initial encounter: Secondary | ICD-10-CM | POA: Diagnosis not present

## 2021-07-17 DIAGNOSIS — W010XXA Fall on same level from slipping, tripping and stumbling without subsequent striking against object, initial encounter: Secondary | ICD-10-CM | POA: Insufficient documentation

## 2021-07-17 DIAGNOSIS — R930 Abnormal findings on diagnostic imaging of skull and head, not elsewhere classified: Secondary | ICD-10-CM | POA: Diagnosis not present

## 2021-07-17 DIAGNOSIS — Z96611 Presence of right artificial shoulder joint: Secondary | ICD-10-CM | POA: Diagnosis not present

## 2021-07-17 DIAGNOSIS — M25551 Pain in right hip: Secondary | ICD-10-CM | POA: Diagnosis not present

## 2021-07-17 DIAGNOSIS — I129 Hypertensive chronic kidney disease with stage 1 through stage 4 chronic kidney disease, or unspecified chronic kidney disease: Secondary | ICD-10-CM | POA: Diagnosis not present

## 2021-07-17 DIAGNOSIS — M79601 Pain in right arm: Secondary | ICD-10-CM | POA: Diagnosis not present

## 2021-07-17 DIAGNOSIS — W19XXXA Unspecified fall, initial encounter: Secondary | ICD-10-CM

## 2021-07-17 DIAGNOSIS — R000 Tachycardia, unspecified: Secondary | ICD-10-CM | POA: Diagnosis not present

## 2021-07-17 DIAGNOSIS — R410 Disorientation, unspecified: Secondary | ICD-10-CM | POA: Diagnosis not present

## 2021-07-17 DIAGNOSIS — M25552 Pain in left hip: Secondary | ICD-10-CM | POA: Diagnosis not present

## 2021-07-17 DIAGNOSIS — N189 Chronic kidney disease, unspecified: Secondary | ICD-10-CM | POA: Insufficient documentation

## 2021-07-17 DIAGNOSIS — Z043 Encounter for examination and observation following other accident: Secondary | ICD-10-CM | POA: Diagnosis not present

## 2021-07-17 DIAGNOSIS — Z7902 Long term (current) use of antithrombotics/antiplatelets: Secondary | ICD-10-CM | POA: Diagnosis not present

## 2021-07-17 DIAGNOSIS — J449 Chronic obstructive pulmonary disease, unspecified: Secondary | ICD-10-CM | POA: Insufficient documentation

## 2021-07-17 MED ORDER — OXYCODONE-ACETAMINOPHEN 5-325 MG PO TABS: 1.0000 | ORAL_TABLET | ORAL | 0 refills | Status: DC | PRN

## 2021-07-17 MED ORDER — ONDANSETRON 4 MG PO TBDP
4.0000 mg | ORAL_TABLET | Freq: Once | ORAL | Status: AC
  Administered 2021-07-17: 4 mg via ORAL
  Filled 2021-07-17: qty 1

## 2021-07-17 MED ORDER — OXYCODONE-ACETAMINOPHEN 5-325 MG PO TABS
1.0000 | ORAL_TABLET | Freq: Once | ORAL | Status: AC
  Administered 2021-07-17: 1 via ORAL
  Filled 2021-07-17: qty 1

## 2021-07-17 NOTE — ED Notes (Signed)
Pt presents to ED with c/o of having a mechanical fall out of her wheelchair. Pt states she was trying to reach her cell phone that fell and she tipped over in her wheelchair. Pt c/o of R shoulder pain, no deformity noted. Pt denies numbness and tingling, R radial pulse noted and strong at this time. Pt denies hitting her head or having any +LOC, pt does take plavix. Pt is A&Ox4. NAD noted.  Pt presents hypertensive with HX of the same.

## 2021-07-17 NOTE — ED Provider Notes (Signed)
[facility] Provider Note    Event Date/Time   First MD Initiated Contact with Patient 07/17/21 1348     (approximate)  History   Chief Complaint: Fall and Hip Pain  HPI  Kerri Carter is a 81 y.o. female with a past medical history anxiety, bipolar, CKD, COPD, hyperlipidemia, presents to the emergency department after a fall.  According to the patient she was using her rolling walker when she tripped falling forwards landing onto her right side.  Patient states she caught herself with the right arm but is having pain in the right shoulder into the right bicep.  Did not hit her head.  No LOC.  Patient does take Plavix as her only anticoagulant.  Denies any hip or pelvis pain.  Physical Exam   Triage Vital Signs: ED Triage Vitals  Enc Vitals Group     BP 07/17/21 1345 (!) 195/88     Pulse Rate 07/17/21 1345 (!) 103     Resp 07/17/21 1345 17     Temp 07/17/21 1348 98.3 F (36.8 C)     Temp Source 07/17/21 1348 Oral     SpO2 07/17/21 1345 99 %     Weight --      Height --      Head Circumference --      Peak Flow --      Pain Score 07/17/21 1346 8     Pain Loc --      Pain Edu? --      Excl. in [place]? --     Most recent vital signs: Vitals:   07/17/21 1345 07/17/21 1348  BP: (!) 195/88   Pulse: (!) 103   Resp: 17   Temp:  98.3 F (36.8 C)  SpO2: 99%     General: Awake, no distress.  CV:  Good peripheral perfusion.  Regular rate and rhythm  Resp:  Normal effort.  Equal breath sounds bilaterally.  Abd:  No distention.  Soft, nontender.  No rebound or guarding. Other:  Right shoulder pain   ED Results / Procedures / Treatments   EKG  EKG viewed and interpreted by myself shows sinus tachycardia 106 bpm with a narrow QRS, normal axis, normal intervals, nonspecific ST changes.  RADIOLOGY  I have interpreted the CT images patient has large ventricles but no acute bleed. CT scan read as negative by radiology.  Shoulder x-ray negative.   Humerus x-ray negative   MEDICATIONS ORDERED IN ED: Medications  oxyCODONE-acetaminophen (PERCOCET/ROXICET) 5-325 MG per tablet 1 tablet (has no administration in time range)  ondansetron (ZOFRAN-ODT) disintegrating tablet 4 mg (has no administration in time range)     IMPRESSION / MDM / ASSESSMENT AND PLAN / ED COURSE  I reviewed the triage vital signs and the nursing notes.  Patient's presentation is most consistent with acute presentation with potential threat to life or bodily function.  Patient presents emergency department after a fall.  Patient did not hit her head but she does take Plavix for anticoagulation.  Major complaint is pain in the right shoulder into the right bicep.  We will obtain CT imaging the head to rule out intracranial hemorrhage from shear force effect, we will obtain x-rays of the right shoulder and right humerus to rule out fracture.  Patient has good range of motion in the shoulder however pain with active range of motion, possibly indicating muscular injury.  We will continue to closely monitor, we will dose Percocet for pain control  and Zofran to prevent any nausea.  Patient agreeable to plan.  Patient is hypertensive on her vitals however she is also in pain.  We will reassess after pain medication  Patient's work-up is reassuring.  Negative CT scan of the head.  Negative x-rays of the shoulder and right humerus.  Suspect likely muscular possible rotator cuff injury.  We will place the patient in a right shoulder sling.  We will discharge with short course of pain medication have the patient follow-up with orthopedics.  Patient agreeable to plan of care.  She is calling a family member to come pick her up.  FINAL CLINICAL IMPRESSION(S) / ED DIAGNOSES   Fall Right shoulder pain   Note:  This document was prepared using Dragon voice recognition software and may include unintentional dictation errors.   Harvest Dark, MD 07/17/21 [phone]

## 2021-07-17 NOTE — Discharge Instructions (Addendum)
Please call the number provided for orthopedics to arrange a follow-up appointment.  Return to the emergency department for any symptom personally concerning to yourself.

## 2021-07-17 NOTE — ED Triage Notes (Signed)
[place] trying to sit down using her walker. Right hip pain. Landed on her right side. Also c/o right shoulder pain. Some shortening and rotation to her right hip. Hx of CHF. HTN at 195/95. Aox4.

## 2021-07-17 NOTE — ED Notes (Signed)
Immobilizer placed on pt with no issue by this Probation officer and [place], NT. Pt tolerated well. Pt placed in wheelchair and pushed to lobby to await son. First RN notified.

## 2021-07-22 ENCOUNTER — Emergency Department: Payer: Medicare PPO

## 2021-07-22 ENCOUNTER — Inpatient Hospital Stay
Admission: EM | Admit: 2021-07-22 | Discharge: 2021-07-24 | DRG: 871 | Disposition: A | Discharge status: Home Health | Payer: Medicare PPO | Attending: Obstetrics and Gynecology | Admitting: Obstetrics and Gynecology

## 2021-07-22 ENCOUNTER — Telehealth: Payer: Self-pay | Admitting: Pulmonary Disease

## 2021-07-22 ENCOUNTER — Other Ambulatory Visit: Payer: Self-pay

## 2021-07-22 ENCOUNTER — Encounter: Payer: Self-pay | Admitting: Emergency Medicine

## 2021-07-22 DIAGNOSIS — F3160 Bipolar disorder, current episode mixed, unspecified: Secondary | ICD-10-CM | POA: Diagnosis present

## 2021-07-22 DIAGNOSIS — Z79899 Other long term (current) drug therapy: Secondary | ICD-10-CM

## 2021-07-22 DIAGNOSIS — Z9049 Acquired absence of other specified parts of digestive tract: Secondary | ICD-10-CM

## 2021-07-22 DIAGNOSIS — J9611 Chronic respiratory failure with hypoxia: Secondary | ICD-10-CM | POA: Diagnosis present

## 2021-07-22 DIAGNOSIS — Z96611 Presence of right artificial shoulder joint: Secondary | ICD-10-CM | POA: Diagnosis present

## 2021-07-22 DIAGNOSIS — Z9981 Dependence on supplemental oxygen: Secondary | ICD-10-CM

## 2021-07-22 DIAGNOSIS — Z96641 Presence of right artificial hip joint: Secondary | ICD-10-CM | POA: Diagnosis present

## 2021-07-22 DIAGNOSIS — Z8261 Family history of arthritis: Secondary | ICD-10-CM

## 2021-07-22 DIAGNOSIS — K219 Gastro-esophageal reflux disease without esophagitis: Secondary | ICD-10-CM | POA: Diagnosis present

## 2021-07-22 DIAGNOSIS — J189 Pneumonia, unspecified organism: Secondary | ICD-10-CM | POA: Diagnosis present

## 2021-07-22 DIAGNOSIS — G4733 Obstructive sleep apnea (adult) (pediatric): Secondary | ICD-10-CM | POA: Diagnosis present

## 2021-07-22 DIAGNOSIS — Z87891 Personal history of nicotine dependence: Secondary | ICD-10-CM | POA: Diagnosis not present

## 2021-07-22 DIAGNOSIS — Z7902 Long term (current) use of antithrombotics/antiplatelets: Secondary | ICD-10-CM

## 2021-07-22 DIAGNOSIS — Z8711 Personal history of peptic ulcer disease: Secondary | ICD-10-CM | POA: Diagnosis not present

## 2021-07-22 DIAGNOSIS — I739 Peripheral vascular disease, unspecified: Secondary | ICD-10-CM | POA: Diagnosis present

## 2021-07-22 DIAGNOSIS — Z7952 Long term (current) use of systemic steroids: Secondary | ICD-10-CM

## 2021-07-22 DIAGNOSIS — D72829 Elevated white blood cell count, unspecified: Secondary | ICD-10-CM | POA: Diagnosis not present

## 2021-07-22 DIAGNOSIS — A419 Sepsis, unspecified organism: Principal | ICD-10-CM | POA: Diagnosis present

## 2021-07-22 DIAGNOSIS — Z905 Acquired absence of kidney: Secondary | ICD-10-CM

## 2021-07-22 DIAGNOSIS — E785 Hyperlipidemia, unspecified: Secondary | ICD-10-CM | POA: Diagnosis present

## 2021-07-22 DIAGNOSIS — Z882 Allergy status to sulfonamides status: Secondary | ICD-10-CM

## 2021-07-22 DIAGNOSIS — I700 Atherosclerosis of aorta: Secondary | ICD-10-CM | POA: Diagnosis present

## 2021-07-22 DIAGNOSIS — Z96612 Presence of left artificial shoulder joint: Secondary | ICD-10-CM | POA: Diagnosis present

## 2021-07-22 DIAGNOSIS — D631 Anemia in chronic kidney disease: Secondary | ICD-10-CM | POA: Diagnosis present

## 2021-07-22 DIAGNOSIS — I10 Essential (primary) hypertension: Secondary | ICD-10-CM | POA: Diagnosis present

## 2021-07-22 DIAGNOSIS — R0602 Shortness of breath: Secondary | ICD-10-CM | POA: Diagnosis not present

## 2021-07-22 DIAGNOSIS — D509 Iron deficiency anemia, unspecified: Secondary | ICD-10-CM | POA: Diagnosis present

## 2021-07-22 DIAGNOSIS — I2510 Atherosclerotic heart disease of native coronary artery without angina pectoris: Secondary | ICD-10-CM | POA: Diagnosis present

## 2021-07-22 DIAGNOSIS — F419 Anxiety disorder, unspecified: Secondary | ICD-10-CM | POA: Diagnosis present

## 2021-07-22 DIAGNOSIS — Z8249 Family history of ischemic heart disease and other diseases of the circulatory system: Secondary | ICD-10-CM

## 2021-07-22 DIAGNOSIS — J441 Chronic obstructive pulmonary disease with (acute) exacerbation: Secondary | ICD-10-CM | POA: Diagnosis present

## 2021-07-22 DIAGNOSIS — M069 Rheumatoid arthritis, unspecified: Secondary | ICD-10-CM | POA: Diagnosis present

## 2021-07-22 DIAGNOSIS — R000 Tachycardia, unspecified: Secondary | ICD-10-CM | POA: Diagnosis not present

## 2021-07-22 DIAGNOSIS — I129 Hypertensive chronic kidney disease with stage 1 through stage 4 chronic kidney disease, or unspecified chronic kidney disease: Secondary | ICD-10-CM | POA: Diagnosis present

## 2021-07-22 DIAGNOSIS — F32A Depression, unspecified: Secondary | ICD-10-CM | POA: Diagnosis present

## 2021-07-22 DIAGNOSIS — W19XXXA Unspecified fall, initial encounter: Secondary | ICD-10-CM | POA: Diagnosis present

## 2021-07-22 DIAGNOSIS — E782 Mixed hyperlipidemia: Secondary | ICD-10-CM | POA: Diagnosis not present

## 2021-07-22 DIAGNOSIS — Z96643 Presence of artificial hip joint, bilateral: Secondary | ICD-10-CM | POA: Diagnosis present

## 2021-07-22 DIAGNOSIS — R059 Cough, unspecified: Secondary | ICD-10-CM | POA: Diagnosis not present

## 2021-07-22 DIAGNOSIS — Z881 Allergy status to other antibiotic agents status: Secondary | ICD-10-CM

## 2021-07-22 DIAGNOSIS — Z981 Arthrodesis status: Secondary | ICD-10-CM

## 2021-07-22 DIAGNOSIS — Z66 Do not resuscitate: Secondary | ICD-10-CM | POA: Diagnosis present

## 2021-07-22 DIAGNOSIS — R6510 Systemic inflammatory response syndrome (SIRS) of non-infectious origin without acute organ dysfunction: Secondary | ICD-10-CM

## 2021-07-22 DIAGNOSIS — Z9109 Other allergy status, other than to drugs and biological substances: Secondary | ICD-10-CM

## 2021-07-22 DIAGNOSIS — Z20822 Contact with and (suspected) exposure to covid-19: Secondary | ICD-10-CM | POA: Diagnosis present

## 2021-07-22 DIAGNOSIS — J9809 Other diseases of bronchus, not elsewhere classified: Secondary | ICD-10-CM | POA: Diagnosis not present

## 2021-07-22 DIAGNOSIS — Z885 Allergy status to narcotic agent status: Secondary | ICD-10-CM

## 2021-07-22 DIAGNOSIS — R918 Other nonspecific abnormal finding of lung field: Secondary | ICD-10-CM | POA: Diagnosis not present

## 2021-07-22 DIAGNOSIS — Z96651 Presence of right artificial knee joint: Secondary | ICD-10-CM | POA: Diagnosis present

## 2021-07-22 DIAGNOSIS — J440 Chronic obstructive pulmonary disease with acute lower respiratory infection: Secondary | ICD-10-CM | POA: Diagnosis present

## 2021-07-22 DIAGNOSIS — J929 Pleural plaque without asbestos: Secondary | ICD-10-CM | POA: Diagnosis not present

## 2021-07-22 DIAGNOSIS — Z886 Allergy status to analgesic agent status: Secondary | ICD-10-CM

## 2021-07-22 DIAGNOSIS — N1831 Chronic kidney disease, stage 3a: Secondary | ICD-10-CM | POA: Diagnosis present

## 2021-07-22 DIAGNOSIS — Z91048 Other nonmedicinal substance allergy status: Secondary | ICD-10-CM

## 2021-07-22 DIAGNOSIS — Z7951 Long term (current) use of inhaled steroids: Secondary | ICD-10-CM

## 2021-07-22 DIAGNOSIS — D649 Anemia, unspecified: Secondary | ICD-10-CM | POA: Diagnosis present

## 2021-07-22 DIAGNOSIS — Z91041 Radiographic dye allergy status: Secondary | ICD-10-CM

## 2021-07-22 HISTORY — DX: Sepsis, unspecified organism: A41.9

## 2021-07-22 HISTORY — DX: Pneumonia, unspecified organism: J18.9

## 2021-07-22 LAB — CBC WITH DIFFERENTIAL/PLATELET
Abs Immature Granulocytes: 0.14 10*3/uL — ABNORMAL HIGH (ref 0.00–0.07)
Basophils Absolute: 0.1 10*3/uL (ref 0.0–0.1)
Basophils Relative: 1 %
Eosinophils Absolute: 0.6 10*3/uL — ABNORMAL HIGH (ref 0.0–0.5)
Eosinophils Relative: 4 %
HCT: 33.2 % — ABNORMAL LOW (ref 36.0–46.0)
Hemoglobin: 9.9 g/dL — ABNORMAL LOW (ref 12.0–15.0)
Immature Granulocytes: 1 %
Lymphocytes Relative: 31 %
Lymphs Abs: 4 10*3/uL (ref 0.7–4.0)
MCH: 24.9 pg — ABNORMAL LOW (ref 26.0–34.0)
MCHC: 29.8 g/dL — ABNORMAL LOW (ref 30.0–36.0)
MCV: 83.4 fL (ref 80.0–100.0)
Monocytes Absolute: 0.9 10*3/uL (ref 0.1–1.0)
Monocytes Relative: 7 %
Neutro Abs: 7.4 10*3/uL (ref 1.7–7.7)
Neutrophils Relative %: 56 %
Platelets: 344 10*3/uL (ref 150–400)
RBC: 3.98 MIL/uL (ref 3.87–5.11)
RDW: 16.1 % — ABNORMAL HIGH (ref 11.5–15.5)
WBC: 13.1 10*3/uL — ABNORMAL HIGH (ref 4.0–10.5)
nRBC: 0.2 % (ref 0.0–0.2)

## 2021-07-22 LAB — COMPREHENSIVE METABOLIC PANEL
ALT: 13 U/L (ref 0–44)
AST: 27 U/L (ref 15–41)
Albumin: 3.2 g/dL — ABNORMAL LOW (ref 3.5–5.0)
Alkaline Phosphatase: 110 U/L (ref 38–126)
Anion gap: 9 (ref 5–15)
BUN: 14 mg/dL (ref 8–23)
CO2: 24 mmol/L (ref 22–32)
Calcium: 8.4 mg/dL — ABNORMAL LOW (ref 8.9–10.3)
Chloride: 109 mmol/L (ref 98–111)
Creatinine, Ser: 1.01 mg/dL — ABNORMAL HIGH (ref 0.44–1.00)
GFR, Estimated: 57 mL/min — ABNORMAL LOW (ref >60)
Glucose, Bld: 117 mg/dL — ABNORMAL HIGH (ref 70–99)
Potassium: 4.7 mmol/L (ref 3.5–5.1)
Sodium: 142 mmol/L (ref 135–145)
Total Bilirubin: 0.7 mg/dL (ref 0.3–1.2)
Total Protein: 6.6 g/dL (ref 6.5–8.1)

## 2021-07-22 LAB — LACTIC ACID, PLASMA
Lactic Acid, Venous: 2.3 mmol/L (ref 0.5–1.9)
Lactic Acid, Venous: 1.6 mmol/L (ref 0.5–1.9)

## 2021-07-22 LAB — PROCALCITONIN: Procalcitonin: <0.1 ng/mL

## 2021-07-22 LAB — TROPONIN I (HIGH SENSITIVITY)
Troponin I (High Sensitivity): 52 ng/L — ABNORMAL HIGH (ref <18)
Troponin I (High Sensitivity): 51 ng/L — ABNORMAL HIGH (ref <18)

## 2021-07-22 LAB — BRAIN NATRIURETIC PEPTIDE: B Natriuretic Peptide: 334.3 pg/mL — ABNORMAL HIGH (ref 0.0–100.0)

## 2021-07-22 LAB — SARS CORONAVIRUS 2 BY RT PCR: SARS Coronavirus 2 by RT PCR: NEGATIVE

## 2021-07-22 MED ORDER — ONDANSETRON HCL 4 MG PO TABS: 4.0000 mg | ORAL_TABLET | Freq: Four times a day (QID) | ORAL | Status: DC | PRN

## 2021-07-22 MED ORDER — ORAL CARE MOUTH RINSE: 15.0000 mL | OROMUCOSAL | Status: DC | PRN

## 2021-07-22 MED ORDER — AMLODIPINE BESYLATE 5 MG PO TABS
5.0000 mg | ORAL_TABLET | Freq: Two times a day (BID) | ORAL | Status: DC
  Administered 2021-07-22: 5 mg via ORAL
  Filled 2021-07-22: qty 1

## 2021-07-22 MED ORDER — SUCRALFATE 1 G PO TABS
1.0000 g | ORAL_TABLET | Freq: Two times a day (BID) | ORAL | Status: DC
  Administered 2021-07-22 – 2021-07-24 (×4): 1 g via ORAL
  Filled 2021-07-22 (×4): qty 1

## 2021-07-22 MED ORDER — LACTATED RINGERS IV SOLN: INTRAVENOUS | Status: DC

## 2021-07-22 MED ORDER — ACETAMINOPHEN 325 MG PO TABS: 650.0000 mg | ORAL_TABLET | Freq: Four times a day (QID) | ORAL | Status: DC | PRN

## 2021-07-22 MED ORDER — ONDANSETRON HCL 4 MG/2ML IJ SOLN: 4.0000 mg | Freq: Four times a day (QID) | INTRAMUSCULAR | Status: DC | PRN

## 2021-07-22 MED ORDER — MIRABEGRON ER 50 MG PO TB24
50.0000 mg | ORAL_TABLET | Freq: Every day | ORAL | Status: DC
  Administered 2021-07-23 – 2021-07-24 (×2): 50 mg via ORAL
  Filled 2021-07-22 (×2): qty 1

## 2021-07-22 MED ORDER — AMLODIPINE BESYLATE 5 MG PO TABS
5.0000 mg | ORAL_TABLET | Freq: Every day | ORAL | Status: DC
  Administered 2021-07-23 – 2021-07-24 (×2): 5 mg via ORAL
  Filled 2021-07-22 (×2): qty 1

## 2021-07-22 MED ORDER — FLUTICASONE PROPIONATE 50 MCG/ACT NA SUSP
2.0000 | Freq: Every day | NASAL | Status: DC
  Administered 2021-07-23 – 2021-07-24 (×2): 2 via NASAL
  Filled 2021-07-22: qty 16

## 2021-07-22 MED ORDER — IPRATROPIUM-ALBUTEROL 0.5-2.5 (3) MG/3ML IN SOLN
3.0000 mL | Freq: Three times a day (TID) | RESPIRATORY_TRACT | Status: DC
  Administered 2021-07-23: 3 mL via RESPIRATORY_TRACT
  Filled 2021-07-22: qty 3

## 2021-07-22 MED ORDER — IPRATROPIUM-ALBUTEROL 0.5-2.5 (3) MG/3ML IN SOLN
3.0000 mL | Freq: Once | RESPIRATORY_TRACT | Status: AC
  Administered 2021-07-22: 3 mL via RESPIRATORY_TRACT
  Filled 2021-07-22: qty 3

## 2021-07-22 MED ORDER — ACETAMINOPHEN 650 MG RE SUPP: 650.0000 mg | Freq: Four times a day (QID) | RECTAL | Status: DC | PRN

## 2021-07-22 MED ORDER — NEBIVOLOL HCL 5 MG PO TABS
5.0000 mg | ORAL_TABLET | Freq: Two times a day (BID) | ORAL | Status: DC
  Administered 2021-07-22 – 2021-07-24 (×4): 5 mg via ORAL
  Filled 2021-07-22 (×5): qty 1

## 2021-07-22 MED ORDER — LACTATED RINGERS IV BOLUS
1000.0000 mL | Freq: Once | INTRAVENOUS | Status: AC
  Administered 2021-07-22: 1000 mL via INTRAVENOUS

## 2021-07-22 MED ORDER — ESCITALOPRAM OXALATE 10 MG PO TABS
10.0000 mg | ORAL_TABLET | Freq: Every day | ORAL | Status: DC
  Administered 2021-07-23 – 2021-07-24 (×2): 10 mg via ORAL
  Filled 2021-07-22 (×2): qty 1

## 2021-07-22 MED ORDER — HYDROCOD POLI-CHLORPHE POLI ER 10-8 MG/5ML PO SUER
5.0000 mL | Freq: Every evening | ORAL | Status: DC | PRN
  Administered 2021-07-22 – 2021-07-23 (×2): 5 mL via ORAL
  Filled 2021-07-22 (×2): qty 5

## 2021-07-22 MED ORDER — BUDESONIDE 0.5 MG/2ML IN SUSP
0.5000 mg | Freq: Once | RESPIRATORY_TRACT | Status: AC
  Administered 2021-07-22: 0.5 mg via RESPIRATORY_TRACT
  Filled 2021-07-22: qty 2

## 2021-07-22 MED ORDER — SODIUM CHLORIDE 0.9 % IV SOLN
2.0000 g | INTRAVENOUS | Status: DC
  Filled 2021-07-22: qty 20

## 2021-07-22 MED ORDER — GABAPENTIN 300 MG PO CAPS
600.0000 mg | ORAL_CAPSULE | Freq: Two times a day (BID) | ORAL | Status: DC
  Administered 2021-07-22 – 2021-07-24 (×4): 600 mg via ORAL
  Filled 2021-07-22 (×4): qty 2

## 2021-07-22 MED ORDER — BUDESONIDE 0.25 MG/2ML IN SUSP: RESPIRATORY_TRACT | 11 refills | Status: DC

## 2021-07-22 MED ORDER — PRAVASTATIN SODIUM 20 MG PO TABS
20.0000 mg | ORAL_TABLET | Freq: Every day | ORAL | Status: DC
  Administered 2021-07-23: 20 mg via ORAL
  Filled 2021-07-22: qty 1

## 2021-07-22 MED ORDER — SODIUM CHLORIDE 0.9 % IV SOLN
500.0000 mg | Freq: Once | INTRAVENOUS | Status: AC
  Administered 2021-07-22: 500 mg via INTRAVENOUS
  Filled 2021-07-22: qty 5

## 2021-07-22 MED ORDER — BUSPIRONE HCL 10 MG PO TABS
5.0000 mg | ORAL_TABLET | Freq: Two times a day (BID) | ORAL | Status: DC
  Administered 2021-07-22 – 2021-07-24 (×4): 5 mg via ORAL
  Filled 2021-07-22 (×4): qty 1

## 2021-07-22 MED ORDER — SENNOSIDES-DOCUSATE SODIUM 8.6-50 MG PO TABS: 1.0000 | ORAL_TABLET | Freq: Every evening | ORAL | Status: DC | PRN

## 2021-07-22 MED ORDER — ENOXAPARIN SODIUM 40 MG/0.4ML IJ SOSY
40.0000 mg | PREFILLED_SYRINGE | INTRAMUSCULAR | Status: DC
  Administered 2021-07-22 – 2021-07-23 (×2): 40 mg via SUBCUTANEOUS
  Filled 2021-07-22 (×2): qty 0.4

## 2021-07-22 MED ORDER — MONTELUKAST SODIUM 10 MG PO TABS
10.0000 mg | ORAL_TABLET | Freq: Every day | ORAL | Status: DC
  Administered 2021-07-23: 10 mg via ORAL
  Filled 2021-07-22: qty 1

## 2021-07-22 MED ORDER — SODIUM CHLORIDE 0.9 % IV SOLN
500.0000 mg | INTRAVENOUS | Status: DC
  Filled 2021-07-22: qty 5

## 2021-07-22 MED ORDER — METHYLPREDNISOLONE SODIUM SUCC 125 MG IJ SOLR
125.0000 mg | Freq: Once | INTRAMUSCULAR | Status: AC
  Administered 2021-07-22: 125 mg via INTRAVENOUS
  Filled 2021-07-22: qty 2

## 2021-07-22 MED ORDER — VITAMIN D 25 MCG (1000 UNIT) PO TABS
4000.0000 [IU] | ORAL_TABLET | Freq: Every day | ORAL | Status: DC
  Administered 2021-07-23 – 2021-07-24 (×2): 4000 [IU] via ORAL
  Filled 2021-07-22 (×2): qty 4

## 2021-07-22 MED ORDER — PANTOPRAZOLE SODIUM 40 MG PO TBEC
40.0000 mg | DELAYED_RELEASE_TABLET | Freq: Two times a day (BID) | ORAL | Status: DC
  Administered 2021-07-22 – 2021-07-24 (×4): 40 mg via ORAL
  Filled 2021-07-22 (×4): qty 1

## 2021-07-22 MED ORDER — HYDRALAZINE HCL 20 MG/ML IJ SOLN
5.0000 mg | Freq: Four times a day (QID) | INTRAMUSCULAR | Status: DC | PRN
Start: 2021-07-22 — End: 2021-07-24

## 2021-07-22 MED ORDER — LACTATED RINGERS IV BOLUS
500.0000 mL | Freq: Once | INTRAVENOUS | Status: AC
  Administered 2021-07-22: 500 mL via INTRAVENOUS

## 2021-07-22 MED ORDER — QUETIAPINE FUMARATE 25 MG PO TABS
25.0000 mg | ORAL_TABLET | Freq: Every day | ORAL | Status: DC
  Administered 2021-07-22 – 2021-07-23 (×2): 25 mg via ORAL
  Filled 2021-07-22 (×2): qty 1

## 2021-07-22 MED ORDER — GUAIFENESIN ER 600 MG PO TB12
600.0000 mg | ORAL_TABLET | Freq: Two times a day (BID) | ORAL | Status: DC | PRN
Start: 2021-07-22 — End: 2021-07-24
  Administered 2021-07-22 – 2021-07-23 (×2): 600 mg via ORAL
  Filled 2021-07-22 (×2): qty 1

## 2021-07-22 MED ORDER — BUDESONIDE 0.25 MG/2ML IN SUSP
0.2500 mg | Freq: Every day | RESPIRATORY_TRACT | Status: DC
Start: 2021-07-23 — End: 2021-07-23
  Administered 2021-07-23: 0.25 mg via RESPIRATORY_TRACT
  Filled 2021-07-22: qty 2

## 2021-07-22 MED ORDER — FAMOTIDINE 20 MG PO TABS
20.0000 mg | ORAL_TABLET | Freq: Every day | ORAL | Status: DC
  Administered 2021-07-23 – 2021-07-24 (×2): 20 mg via ORAL
  Filled 2021-07-22 (×2): qty 1

## 2021-07-22 MED ORDER — OXYCODONE-ACETAMINOPHEN 5-325 MG PO TABS: 1.0000 | ORAL_TABLET | ORAL | Status: DC | PRN

## 2021-07-22 MED ORDER — LAMOTRIGINE 25 MG PO TABS
150.0000 mg | ORAL_TABLET | Freq: Two times a day (BID) | ORAL | Status: DC
  Administered 2021-07-23: 150 mg via ORAL
  Filled 2021-07-22: qty 6

## 2021-07-22 MED ORDER — CLOPIDOGREL BISULFATE 75 MG PO TABS
75.0000 mg | ORAL_TABLET | Freq: Every day | ORAL | Status: DC
  Administered 2021-07-23 – 2021-07-24 (×2): 75 mg via ORAL
  Filled 2021-07-22 (×2): qty 1

## 2021-07-22 MED ORDER — SODIUM CHLORIDE 0.9 % IV SOLN
1.0000 g | Freq: Once | INTRAVENOUS | Status: AC
  Administered 2021-07-22: 1 g via INTRAVENOUS
  Filled 2021-07-22: qty 10

## 2021-07-22 MED ORDER — LEFLUNOMIDE 20 MG PO TABS
20.0000 mg | ORAL_TABLET | Freq: Every day | ORAL | Status: DC
  Filled 2021-07-22: qty 1

## 2021-07-22 NOTE — Assessment & Plan Note (Signed)
-   Presumed secondary to pneumonia - CBC in the a.m.

## 2021-07-22 NOTE — Assessment & Plan Note (Signed)
-   Pravastatin 20 mg daily resumed 

## 2021-07-22 NOTE — Telephone Encounter (Signed)
Patient is aware that pulmicort has been sent to preferred pharmacy.  She reports of severe SOB with exertion and dry cough at times prod with thick green sputum. She can not walk 10 feet without stopping to catch her breath.  She wears 2L QHS and PRN during.  Spo2 is maintaining around 93% on roomair. Denied f/c/s or additional sx.   Dr. Patsey Berthold, please advise.

## 2021-07-22 NOTE — Assessment & Plan Note (Addendum)
-   Continue azithromycin 500 mg IV daily and ceftriaxone 1 g IV daily - Incentive spirometry and flutter valve - Pulmicort 0.25 mg nebulizer daily ordered - DuoNebs 3 times daily scheduled, 4 doses ordered - Guaifenesin 600 mg p.o. twice daily as needed for loosening phlegm and/or cough; Tussionex 5 mg p.o. nightly as needed for cough, 3 days ordered - Admit to telemetry medical, observation

## 2021-07-22 NOTE — Assessment & Plan Note (Signed)
-   DuoNebs 3 times daily, Pulmicort nebulizer daily, flutter valve, incentive spirometry

## 2021-07-22 NOTE — Assessment & Plan Note (Signed)
-   Patient meets sepsis criteria with the very definition in setting of pneumonia, leukocytosis, sinus tachycardia however she does not appear to be septic on presentation and I have low clinical suspicion of sepsis at this time - Blood cultures x2 were collected and are in process - Initial lactic acid was elevated at 2.3, we will continue to follow - Status post LR 500 mL bolus per EDP - I ordered additional LR 1 L bolus, and LR infusion at 125 mL/h for 1 day

## 2021-07-22 NOTE — Assessment & Plan Note (Signed)
-   Buspirone 5 mg twice daily, escitalopram 10 mg daily resumed

## 2021-07-22 NOTE — Assessment & Plan Note (Signed)
-   Quetiapine 25 mg nightly resumed

## 2021-07-22 NOTE — Assessment & Plan Note (Signed)
-   Resumed home leflunomide 20 mg daily

## 2021-07-22 NOTE — Hospital Course (Signed)
[name] is a 81 year old female history of anxiety with depression, CKD 3A, status post lumbar fusion, COPD, chronic hypoxic respiratory failure requiring 2 L nasal cannula nightly, bipolar disorder, hypertension, GERD, PAD, peptic ulcer disease, L3-L4 disc herniation with severe stenosis, who presents emergency department for chief concerns of shortness of breath.  Initial vitals in the emergency department showed temperature of 98, respiration rate of 16, heart rate of 107, blood pressure 153/78, SPO2 of 98% on room air.  Serum sodium is 142, potassium 4.3, chloride 109, bicarb 24, BUN of 14, serum creatinine 1.01, GFR 57, nonfasting blood glucose 117, WBC 13.1, platelets of 344, hemoglobin 9.9.  COVID was negative by PCR.  ED treatment: DuoNebs one-time dose, amlodipine 5 mg p.o., Solu-Medrol 125 mg IV one-time dose, azithromycin 500 mg, ceftriaxone 1 g IV.

## 2021-07-22 NOTE — H&P (Signed)
History and Physical   Kerri Carter FAO:[id number] DOB: August 03, 1940 DOA: 07/22/2021  PCP: McLean-Scocuzza, Nino Glow, MD  Outpatient Specialists: Dr. Jerrye Noble pulmonology Patient coming from: Home  I have personally briefly reviewed patient's old medical records in [place].  Chief Concern: Shortness of breath  HPI: [name] is a 81 year old female history of anxiety with depression, CKD 3A, status post lumbar fusion, COPD, chronic hypoxic respiratory failure requiring 2 L nasal cannula nightly, bipolar disorder, hypertension, GERD, PAD, peptic ulcer disease, L3-L4 disc herniation with severe stenosis, who presents emergency department for chief concerns of shortness of breath.  Initial vitals in the emergency department showed temperature of 98, respiration rate of 16, heart rate of 107, blood pressure 153/78, SPO2 of 98% on room air.  Serum sodium is 142, potassium 4.3, chloride 109, bicarb 24, BUN of 14, serum creatinine 1.01, GFR 57, nonfasting blood glucose 117, WBC 13.1, platelets of 344, hemoglobin 9.9.  COVID was negative by PCR.  ED treatment: DuoNebs one-time dose, amlodipine 5 mg p.o., Solu-Medrol 125 mg IV one-time dose, azithromycin 500 mg, ceftriaxone 1 g IV.  At bedside patient is awake alert and oriented to self, location, age, nephew Kerri Carter at bedside, current calendar year.  She reports that she has been having worsening shortness of breath for about 3 to 4 days.  She reports increased cough that is nonproductive and the shortness of breath is worse with exertion.  She denies chest pain, abdominal pain, dysuria, hematuria, diarrhea.  She endorses swelling of her lower extremities that are relieved when she puts up her legs.  She states that this is normal for her.  She denies any known fever at home.  Of note she lives in a home with a 11-year-old great granddaughter who goes to daycare.  The 39-year-old great granddaughter had some respiratory about a  week ago that was presumed to be asthma.  Grandson at bedside states that the daycare had reported kids with respiratory infections and teachers getting.  Social history: Patient lives at home with her grandson, and great grandchildren.  She is a former tobacco user quitting over 40 years ago.  She denies EtOH and recreational drug use.  ROS: Constitutional: no weight change, no fever ENT/Mouth: no sore throat, no rhinorrhea Eyes: no eye pain, no vision changes Cardiovascular: no chest pain, + dyspnea,  no edema, no palpitations Respiratory: + cough, no sputum, + wheezing Gastrointestinal: no nausea, no vomiting, no diarrhea, no constipation Genitourinary: no urinary incontinence, no dysuria, no hematuria Musculoskeletal: no arthralgias, no myalgias Skin: no skin lesions, no pruritus, Neuro: + weakness, no loss of consciousness, no syncope Psych: no anxiety, no depression, + decrease appetite Heme/Lymph: no bruising, no bleeding  ED Course: Discussed with emergency medicine provider, patient requiring hospitalization for chief concerns of [facility] pneumonia.  Assessment/Plan  Principal Problem:   Community acquired pneumonia Active Problems:   OSA (obstructive sleep apnea)   SOB (shortness of breath)   Rheumatoid arthritis (HCC)   Hyperlipidemia   Essential hypertension   Mixed bipolar I disorder (HCC)   Leukocytosis   PAD (peripheral artery disease) (HCC)   COPD exacerbation (HCC)   Anxiety and depression   Sepsis due to pneumonia ([place])   Assessment and Plan:  * Community acquired pneumonia - Continue azithromycin 500 mg IV daily and ceftriaxone 1 g IV daily - Incentive spirometry and flutter valve - Pulmicort 0.25 mg nebulizer daily ordered - DuoNebs 3 times daily scheduled, 4 doses  ordered - Guaifenesin 600 mg p.o. twice daily as needed for loosening phlegm and/or cough; Tussionex 5 mg p.o. nightly as needed for cough, 3 days ordered - Admit to telemetry medical,  observation  Sepsis due to pneumonia [facility]) - Patient meets sepsis criteria with the very definition in setting of pneumonia, leukocytosis, sinus tachycardia however she does not appear to be septic on presentation and I have low clinical suspicion of sepsis at this time - Blood cultures x2 were collected and are in process - Initial lactic acid was elevated at 2.3, we will continue to follow - Status post LR 500 mL bolus per EDP - I ordered additional LR 1 L bolus, and LR infusion at 125 mL/h for 1 day  Anxiety and depression - Buspirone 5 mg twice daily, escitalopram 10 mg daily resumed  COPD exacerbation (HCC) - DuoNebs 3 times daily, Pulmicort nebulizer daily, flutter valve, incentive spirometry  Leukocytosis - Presumed secondary to pneumonia - CBC in the a.m.  Mixed bipolar I disorder (HCC) - Quetiapine 25 mg nightly resumed  Essential hypertension - Resumed home amlodipine 5 mg daily, nebivolol 5 mg p.o. twice daily - Hydralazine 5 mg IV every 6 hours as needed for SBP greater than 175  Hyperlipidemia - Pravastatin 20 mg daily resumed  Rheumatoid arthritis (HCC) - Resumed home leflunomide 20 mg daily  Chart reviewed.   DVT prophylaxis: Enoxaparin Code Status: DNR/DNI Diet: Heart healthy Family Communication: Updated grandson, Kerri Carter at bedside with patient's permission Disposition Plan: Pending clinical course Consults called: None at this time Admission status: Telemetry medical, observation  Past Medical History:  Diagnosis Date   Anxiety    Aortic stenosis 07/09/2015   a.) TTE 07/06/2015: EF 55-60%; mild AS with MPG 13.0 mmHg.   Asthma    Bipolar disorder ([place])    C. difficile diarrhea 2010   a.) following ABX course during hospital admission   Carotid atherosclerosis, bilateral    a.) Moderate; < 50% stenosis BILATERAL ICAs.   Cataract    a.) s/p BILATERAL extraction   Chicken pox    CKD (chronic kidney disease), stage III (HCC)    a. s/p R  nephrectomy./ aneurysm   Conversion disorder    COPD (chronic obstructive pulmonary disease) (HCC)    Depression    Essential hypertension    GERD (gastroesophageal reflux disease)    Heart murmur    Hyperlipidemia    ILD (interstitial lung disease) (HCC)    mild; 2/2 RA diagnosis   Inflammatory arthritis    a. hands/carpal tunnel.  b. Low titer rheumatoid factor. c. Negative anti-CCP antibodies. d. Plaquenil.   Macular degeneration    Nocturnal hypoxemia    Non-Obstructive CAD    a. 07/2009 Cath ([facility]): nonobs dzs;  b. 03/2011 Cath [facility]): nonobs dzs.   Osteoarthritis    a. Knees.   PAD (peripheral artery disease) (HCC)    PUD (peptic ulcer disease)    S/P right hip fracture    11/01/16 s/p repair   Shoulder pain    Sleep apnea    no cpap / minimal   Spinal stenosis at L4-L5 level    severe with L4/L5 anterolisthesis grade 1 anterolisthesis    Toxic maculopathy    Valvular heart disease    a.) TTE 07/06/2015: EF 55-60%; Mild MR/AR/TR; Mild AS with MPG 13.0 mmHg.   Past Surgical History:  Procedure Laterality Date   APPENDECTOMY     APPLICATION OF INTRAOPERATIVE CT SCAN N/A 05/23/2021   Procedure: APPLICATION  OF INTRAOPERATIVE CT SCAN;  Surgeon: Meade Maw, MD;  Location: [facility] ORS;  Service: Neurosurgery;  Laterality: N/A;   BACK SURGERY     lumbar fusion   BUNIONECTOMY Right    CATARACT EXTRACTION, BILATERAL     CESAREAN SECTION     x1   CHOLECYSTECTOMY N/A 05/11/2016   Procedure: LAPAROSCOPIC CHOLECYSTECTOMY;  Surgeon: Florene Glen, MD;  Location: [facility] ORS;  Service: General;  Laterality: N/A;   COLONOSCOPY WITH PROPOFOL N/A 04/02/2016   Procedure: COLONOSCOPY WITH PROPOFOL;  Surgeon: Jonathon Bellows, MD;  Location: [facility] ENDOSCOPY;  Service: Endoscopy;  Laterality: N/A;   ENDOSCOPIC RETROGRADE CHOLANGIOPANCREATOGRAPHY (ERCP) WITH PROPOFOL N/A 05/08/2016   Procedure: ENDOSCOPIC RETROGRADE CHOLANGIOPANCREATOGRAPHY (ERCP) WITH PROPOFOL;  Surgeon: Lucilla Lame, MD;  Location:  [facility] ENDOSCOPY;  Service: Endoscopy;  Laterality: N/A;   ERCP     with biliary spincterotomy 05/08/16 Dr. Allen Norris for choledocholithiasis    ESOPHAGEAL DILATION  04/02/2016   Procedure: ESOPHAGEAL DILATION;  Surgeon: Jonathon Bellows, MD;  Location: [facility] ENDOSCOPY;  Service: Endoscopy;;   ESOPHAGOGASTRODUODENOSCOPY (EGD) WITH PROPOFOL N/A 04/02/2016   Procedure: ESOPHAGOGASTRODUODENOSCOPY (EGD) WITH PROPOFOL;  Surgeon: Jonathon Bellows, MD;  Location: [facility] END[facility];  Service: Endoscopy;  Laterality: N/A;   HIP ARTHROPLASTY Right 11/01/2016   Procedure: ARTHROPLASTY BIPOLAR HIP (HEMIARTHROPLASTY);  Surgeon: Corky Mull, MD;  Location: [facility] ORS;  Service: Orthopedics;  Laterality: Right;   LUMBAR LAMINECTOMY/DECOMPRESSION MICRODISCECTOMY N/A 12/11/2020   Procedure: LEFT L2-3 MICRODISCECTOMY, L3-4 AND L5-S1 DECOMPRESSION;  Surgeon: Meade Maw, MD;  Location: [facility] ORS;  Service: Neurosurgery;  Laterality: N/A;   NEPHRECTOMY  1988   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   ORIF SCAPHOID FRACTURE Left 12/21/2019   Procedure: OPEN REDUCTION INTERNAL FIXATION (ORIF) OF LEFT SCAPULAR NONUNION WITH BONE GRAFT;  Surgeon: Corky Mull, MD;  Location: [facility] ORS;  Service: Orthopedics;  Laterality: Left;   osteoporosis     noted DEXA 08/19/16    REPLACEMENT TOTAL KNEE Right    REVERSE SHOULDER ARTHROPLASTY Right 11/04/2017   Procedure: REVERSE SHOULDER ARTHROPLASTY;  Surgeon: Corky Mull, MD;  Location: [facility] ORS;  Service: Orthopedics;  Laterality: Right;   REVERSE SHOULDER ARTHROPLASTY Left 07/26/2018   Procedure: REVERSE SHOULDER ARTHROPLASTY;  Surgeon: Corky Mull, MD;  Location: [facility] OR[facility];  Service: Orthopedics;  Laterality: Left;   TONSILLECTOMY     TOTAL HIP ARTHROPLASTY  12/10/11   [facility] left hip   TOTAL HIP ARTHROPLASTY Bilateral    TRANSFORAMINAL LUMBAR INTERBODY FUSION (TLIF) WITH PEDICLE SCREW FIXATION 3 LEVEL N/A 05/23/2021   Procedure: OPEN L2-4 TRANSFORAMINAL LUMBAR INTERBODY  FUSION (TLIF) WITH L2-5 POSTERIOR SPINAL FUSION;  Surgeon: Meade Maw, MD;  Location: [facility] ORS;  Service: Neurosurgery;  Laterality: N/A;   TUBAL LIGATION     Social History:  reports that she quit smoking about 47 years ago. Her smoking use included cigarettes. She has a 10.00 pack-year smoking history. She has never used smokeless tobacco. She reports that she does not drink alcohol and does not use drugs.  Allergies  Allergen Reactions   Ceftin [Cefuroxime Axetil] Anaphylaxis   Lisinopril Anaphylaxis   Sulfa Antibiotics Other (See Comments)    Face swelling   Sulfasalazine Anaphylaxis   Morphine Other (See Comments)    Per patient, low blood pressure issues that requires action to raise it back up. Can take small infrequent doses   Xarelto [Rivaroxaban] Other (See Comments)    Stomach burning, bleeding, and tar in stool   Adhesive [  Tape] Rash    Paper tape and tega derm OK   Antihistamines, Chlorpheniramine-Type Other (See Comments)    Makes pt hyper   Antivert [Meclizine Hcl] Other (See Comments)    Bladder will not empty   Aspirin Other (See Comments)    Sulfasalazine allergy cross reacts   Contrast Media [Iodinated Contrast Media] Rash    she is able to use betadine scrubs.   Decongestant [Pseudoephedrine Hcl] Other (See Comments)    Makes pt hyper   Doxycycline Other (See Comments)    GI upset   Levaquin [Levofloxacin In D5w] Rash   Polymyxin B Other (See Comments)    Facial rash   Tetanus Toxoids Rash and Other (See Comments)    Fever and hot to touch at injection site   Family History  Problem Relation Age of Onset   Rheum arthritis Mother    Asthma Mother    Parkinson's disease Mother    Heart disease Mother    Stroke Mother    Hypertension Mother    Heart attack Father    Heart disease Father    Hypertension Father    Peripheral Artery Disease Father    Diabetes Son    Gout Son    Asthma Sister    Heart disease Sister    Lung cancer Sister     Heart disease Sister    Heart disease Sister    Breast cancer Sister    Heart attack Sister    Heart disease Brother    Heart disease Maternal Grandmother    Diabetes Maternal Grandmother    Colon cancer Maternal Grandmother    Cancer Maternal Grandmother        Hodgkins lymphoma   Heart disease Brother    Alcohol abuse Brother    Depression Brother    Dementia Son    Family history: Family history reviewed and not pertinent.  Prior to Admission medications   Medication Sig Start Date End Date Taking? Authorizing Provider  Adalimumab 40 MG/0.4ML PNKT Inject 40 mg into the skin every 14 (fourteen) days.   Yes [provider]  amLODipine (NORVASC) 5 MG tablet Take 5 mg by mouth daily.   Yes [provider]  budesonide (PULMICORT) 0.25 MG/2ML nebulizer solution USE 1 VIAL  IN  NEBULIZER TWICE  DAILY - rinse mouth after treatment 07/22/21  Yes [name], MD  busPIRone (BUSPAR) 5 MG tablet Take 1 tablet (5 mg total) by mouth 2 (two) times daily. 06/02/21  Yes Jennye Boroughs, MD  Cholecalciferol (VITAMIN D3) 50 MCG (2000 UT) TABS Take 4,000 Units by mouth daily.   Yes [provider]  clopidogrel (PLAVIX) 75 MG tablet Take 75 mg by mouth daily.   Yes [provider]  escitalopram (LEXAPRO) 10 MG tablet TAKE 1 TABLET BY MOUTH EVERY DAY 09/13/20  Yes McLean-Scocuzza, Nino Glow, MD  etodolac (LODINE) 500 MG tablet Take 500 mg by mouth daily. 12/07/20  Yes [provider]  famotidine (PEPCID) 20 MG tablet TAKE 1 TABLET (20 MG TOTAL) BY MOUTH DAILY. BEFORE BREAKFAST OR DINNER 03/24/21  Yes McLean-Scocuzza, Nino Glow, MD  fluticasone (FLONASE) 50 MCG/ACT nasal spray Place 2 sprays into both nostrils daily. In am 12/08/19  Yes McLean-Scocuzza, Nino Glow, MD  furosemide (LASIX) 40 MG tablet Take 0.5 tablets (20 mg total) by mouth daily. 06/02/21  Yes Jennye Boroughs, MD  gabapentin (NEURONTIN) 300 MG capsule Take 2 capsules (600 mg total) by mouth in the morning  and at  bedtime. 09/03/20  Yes McLean-Scocuzza, Nino Glow, MD  ipratropium-albuterol (DUONEB) 0.5-2.5 (3) MG/3ML SOLN Take 3 mLs by nebulization 3 (three) times daily as needed. 06/24/21  Yes McLean-Scocuzza, Nino Glow, MD  lamoTRIgine (LAMICTAL) 100 MG tablet TAKE 1 TAB 2 TIMES DAILY. FURTHER REFILLS NEW PSYCH FOR ALL PSYCH MEDS ONLY TEMP SUPPLY FROM PCP 05/02/21  Yes McLean-Scocuzza, Nino Glow, MD  leflunomide (ARAVA) 20 MG tablet Take 1 tablet (20 mg total) by mouth daily. 09/30/17  Yes McLean-Scocuzza, Nino Glow, MD  lovastatin (MEVACOR) 20 MG tablet TAKE 1 TABLET BY MOUTH EVERYDAY AT BEDTIME *STOP TALKING '40MG'$ * Patient taking differently: Take 20 mg by mouth at bedtime. 07/12/20  Yes McLean-Scocuzza, Nino Glow, MD  mirabegron ER (MYRBETRIQ) 50 MG TB24 tablet Take 1 tablet (50 mg total) by mouth daily. 12/04/20 11/29/21 Yes Vaillancourt, Samantha, PA-C  montelukast (SINGULAIR) 10 MG tablet TAKE 1 TABLET BY MOUTH EVERY DAY 03/24/21  Yes McLean-Scocuzza, Nino Glow, MD  multivitamin-lutein (OCUVITE-LUTEIN) CAPS capsule Take 1 capsule by mouth at bedtime.   Yes [provider]  nebivolol (BYSTOLIC) 10 MG tablet Take 0.5 tablets (5 mg total) by mouth in the morning and at bedtime. 12/08/19  Yes McLean-Scocuzza, Nino Glow, MD  oxyCODONE-acetaminophen (PERCOCET) 5-325 MG tablet Take 1 tablet by mouth every 4 (four) hours as needed for severe pain. 07/17/21  Yes Harvest Dark, MD  pantoprazole (PROTONIX) 40 MG tablet TAKE 1 TABLET BY MOUTH 2 TIMES DAILY 30 MIN BEFORE FOOD (NOTE REDUCTION IN FREQUENCY) 07/07/21  Yes McLean-Scocuzza, Nino Glow, MD  predniSONE (DELTASONE) 5 MG tablet Take 1 tablet (5 mg total) by mouth daily with breakfast. 06/02/21  Yes Jennye Boroughs, MD  QUEtiapine (SEROQUEL) 25 MG tablet TAKE 1 TABLET (25 MG TOTAL) BY MOUTH AT BEDTIME. AGAIN LAST FILL FURTHER REFILLS FROM PSYCHIATRY NO EXCEPTIONS 09/13/20  Yes McLean-Scocuzza, Nino Glow, MD  sucralfate (CARAFATE) 1 g tablet Take 1 tablet (1 g total) by mouth  2 (two) times daily. 09/17/20 09/12/21 Yes Jonathon Bellows, MD  albuterol (VENTOLIN HFA) 108 (90 Base) MCG/ACT inhaler Inhale 2 puffs into the lungs every 6 (six) hours as needed for wheezing or shortness of breath. 06/24/21   McLean-Scocuzza, Nino Glow, MD  amLODipine (NORVASC) 2.5 MG tablet Take 2 tablets (5 mg total) by mouth 2 (two) times daily. Patient not taking: Reported on 07/22/2021 06/02/21   Jennye Boroughs, MD  cyclobenzaprine (FLEXERIL) 10 MG tablet Take 1 tablet (10 mg total) by mouth 3 (three) times daily as needed for muscle spasms. 11/04/20   Kathie Dike, MD  dicyclomine (BENTYL) 10 MG capsule Take 1 capsule (10 mg total) by mouth 2 (two) times daily as needed for spasms. Patient taking differently: Take 20 mg by mouth 2 (two) times daily. 10/01/20 09/26/21  Jonathon Bellows, MD  lidocaine (LIDODERM) 5 % Place 1 patch onto the skin 2 (two) times daily as needed. Remove & Discard patch within 12 hours or as directed by MD Patient taking differently: Place 1 patch onto the skin daily. Remove & Discard patch within 12 hours or as directed by MD 11/22/20   McLean-Scocuzza, Nino Glow, MD  methocarbamol (ROBAXIN) 500 MG tablet Take 500 mg by mouth 4 (four) times daily as needed. 01/23/21   [provider]  OXYGEN Inhale 2 L into the lungs at bedtime.    [provider]  zoledronic acid (RECLAST) 5 MG/100ML SOLN injection Inject 5 mg into the vein once.    [provider]   Physical Exam: Vitals:  07/22/21 1900 07/22/21 1957 07/22/21 2100 07/22/21 2206  BP: (!) 157/91 (!) 157/91 (!) 161/70 (!) 157/71  Pulse: (!) 112  (!) 101 (!) 110  Resp: (!) '27  16 15  '$ Temp:    98.2 F (36.8 C)  TempSrc:    Oral  SpO2: 98%  96% 96%  Weight:      Height:       Constitutional: appears age appropriate, frail, NAD, calm, comfortable Eyes: PERRL, lids and conjunctivae normal ENMT: Mucous membranes are moist. Posterior pharynx clear of any exudate or lesions. Age-appropriate dentition.  Hearing appropriate Neck: normal, supple, no masses, no thyromegaly Respiratory: clear to auscultation bilaterally, no wheezing, no crackles.  Mild increased respiratory effort. No accessory muscle use.  Nasal cannula in place Cardiovascular: Regular rate and rhythm, no murmurs / rubs / gallops. No extremity edema. 2+ pedal pulses. No carotid bruits.  Abdomen: Obese abdomen, no tenderness, no masses palpated, no hepatosplenomegaly. Bowel sounds positive.  Musculoskeletal: no clubbing / cyanosis. No joint deformity upper and lower extremities. Good ROM, no contractures, no atrophy. Normal muscle tone.  Skin: no rashes, lesions, ulcers. No induration Neurologic: Sensation intact. Strength 5/5 in all 4.  Psychiatric: Normal judgment and insight. Alert and oriented x 3. Normal mood.   EKG: independently reviewed, showing sinus tachycardia with rate of 113, QTc 452  Chest x-ray on Admission: I personally reviewed and I agree with radiologist reading as below.  CT Chest Wo Contrast  Result Date: 07/22/2021 CLINICAL DATA:  Respiratory illness cough with leg swelling EXAM: CT CHEST WITHOUT CONTRAST TECHNIQUE: Multidetector CT imaging of the chest was performed following the standard protocol without IV contrast. RADIATION DOSE REDUCTION: This exam was performed according to the departmental dose-optimization program which includes automated exposure control, adjustment of the mA and/or kV according to patient size and/or use of iterative reconstruction technique. COMPARISON:  Chest x-ray 07/22/2021, chest CT 03/21/2011, 05/01/2020 FINDINGS: Cardiovascular: Limited evaluation without intravenous contrast. Moderate aortic atherosclerosis. No aneurysm. Coronary vascular calcifications. No pericardial effusion. Mitral calcification. Mediastinum/Nodes: Midline trachea. No thyroid mass. No suspicious lymph nodes. Esophagus within normal limits. Lungs/Pleura: No consolidation, pleural effusion or pneumothorax. Mild  lower lobe bronchial wall thickening with scattered mucous plugging. Mild tree-in-bud nodularity in the left lower lobe, series 3 image 79. Upper Abdomen: No acute abnormality. Musculoskeletal: No acute osseous abnormality. Previously noted nodular opacity may correspond to lead attachment on the left upper anterior chest wall. IMPRESSION: 1. No suspicious pulmonary nodule to correspond to the questioned radiographic abnormality. 2. Mild lower lobe bronchial wall thickening with scattered mucous plugging. Small amount of tree-in-bud nodularity in the left lower lobe suggesting respiratory infection. Aortic Atherosclerosis (ICD10-I70.0). Electronically Signed   By: Donavan Foil M.D.   On: 07/22/2021 19:50   DG Chest 2 View  Result Date: 07/22/2021 CLINICAL DATA:  Cough and swelling in the legs EXAM: CHEST - 2 VIEW COMPARISON:  Chest radiograph 05/29/2021 FINDINGS: The cardiomediastinal silhouette is normal. The lungs hyperinflated with flattening of the diaphragms consistent with underlying COPD. There is no focal consolidation or pulmonary edema. Previously seen opacities in the left lung have significantly improved/resolved. There is no pleural effusion or pneumothorax. There is a 1.3 cm nodular opacity in the left apex not seen on prior studies. Bilateral shoulder arthroplasty hardware, left clavicle surgical fixation, and lumbar spine fusion hardware are noted. Opacities projecting over the right fifth and sixth ribs are consistent with reflect old fractures. IMPRESSION: 1. No radiographic evidence of acute cardiopulmonary process. Previously  seen opacities in the left lung on the study from 05/29/2021 have resolved. 2. 1.3 cm nodular opacity in the left apex not seen on prior studies. Recommend nonemergent chest CT to evaluate for a pulmonary nodule. Electronically Signed   By: Valetta Mole M.D.   On: 07/22/2021 18:06    Labs on Admission: I have personally reviewed following labs  CBC: Recent Labs   Lab 07/22/21 1737  WBC 13.1*  NEUTROABS 7.4  HGB 9.9*  HCT 33.2*  MCV 83.4  PLT 353   Basic Metabolic Panel: Recent Labs  Lab 07/22/21 1849  NA 142  K 4.7  CL 109  CO2 24  GLUCOSE 117*  BUN 14  CREATININE 1.01*  CALCIUM 8.4*   GFR: Estimated Creatinine Clearance: 44.1 mL/min (A) (by C-G formula based on SCr of 1.01 mg/dL (H)).  Liver Function Tests: Recent Labs  Lab 07/22/21 1849  AST 27  ALT 13  ALKPHOS 110  BILITOT 0.7  PROT 6.6  ALBUMIN 3.2*   Urine analysis:    Component Value Date/Time   COLORURINE YELLOW (A) 05/21/2021 1700   APPEARANCEUR CLOUDY (A) 05/21/2021 1700   APPEARANCEUR Clear 10/12/2017 1410   LABSPEC 1.018 05/21/2021 1700   LABSPEC 1.003 11/24/2013 2117   PHURINE 6.0 05/21/2021 1700   GLUCOSEU NEGATIVE 05/21/2021 1700   GLUCOSEU Negative 11/24/2013 2117   HGBUR NEGATIVE 05/21/2021 1700   BILIRUBINUR NEGATIVE 05/21/2021 1700   BILIRUBINUR Negative 10/12/2017 1410   BILIRUBINUR Negative 11/24/2013 2117   KETONESUR NEGATIVE 05/21/2021 1700   PROTEINUR NEGATIVE 05/21/2021 1700   NITRITE NEGATIVE 05/21/2021 1700   LEUKOCYTESUR NEGATIVE 05/21/2021 1700   LEUKOCYTESUR Negative 11/24/2013 2117   Dr. Tobie Poet [facility]  If 7PM-7AM, please contact overnight-coverage provider If 7AM-7PM, please contact day coverage provider [email or web address]  07/22/2021, 10:46 PM

## 2021-07-22 NOTE — ED Triage Notes (Signed)
Pt endorses SOB x1 week. Reports hx of COPD and asthma. Also having a nonproductive cough and lew swelling.

## 2021-07-22 NOTE — ED Provider Notes (Signed)
[facility] Provider Note    Event Date/Time   First MD Initiated Contact with Patient 07/22/21 1828     (approximate)   History   Shortness of Breath   HPI  Kerri Carter is a 81 y.o. female  with a past medical history anxiety, bipolar, CKD, COPD, asthma, HDL, GERD, PAD, peptic ulcer disease, lumbar stenosis and recent ED evaluation on 6/15 after a fall who presents after being referred to the emergency room after speaking with her pulmonologist earlier today for further evaluation of approximately 1 week of worsening cough and some dyspnea on exertion.  Patient has chronic back respiratory failure and wears oxygen as needed during the day and 2 L at night.  She has not noticed an increase in requirement of this.  She has not been coughing up anything today but sometimes will cough up a little green phlegm.  She has not had any fevers, chest pain, abdominal pain, nausea, vomiting, diarrhea, urinary symptoms rash or acute extremity pain.  She is on 5 mg daily prednisone has been using Pulmicort as well as albuterol at home.      Physical Exam  Triage Vital Signs: ED Triage Vitals  Enc Vitals Group     BP 07/22/21 1726 (!) 153/78     Pulse Rate 07/22/21 1726 (!) 107     Resp 07/22/21 1726 16     Temp 07/22/21 1726 98 F (36.7 C)     Temp Source 07/22/21 1726 Oral     SpO2 07/22/21 1726 98 %     Weight 07/22/21 1729 173 lb (78.5 kg)     Height 07/22/21 1729 5' 1.5" (1.562 m)     Head Circumference --      Peak Flow --      Pain Score 07/22/21 1725 0     Pain Loc --      Pain Edu? --      Excl. in [place]? --     Most recent vital signs: Vitals:   07/22/21 1900 07/22/21 1957  BP: (!) 157/91 (!) 157/91  Pulse: (!) 112   Resp: (!) 27   Temp:    SpO2: 98%     General: Awake, no distress.  CV:  Good peripheral perfusion.  Resp:  Normal effort.  Bilateral wheezing Abd:  No distention.  Soft. Other:  Scant lower extremity edema.   ED Results /  Procedures / Treatments  Labs (all labs ordered are listed, but only abnormal results are displayed) Labs Reviewed  CBC WITH DIFFERENTIAL/PLATELET - Abnormal; Notable for the following components:      Result Value   WBC 13.1 (*)    Hemoglobin 9.9 (*)    HCT 33.2 (*)    MCH 24.9 (*)    MCHC 29.8 (*)    RDW 16.1 (*)    Eosinophils Absolute 0.6 (*)    Abs Immature Granulocytes 0.14 (*)    All other components within normal limits  BRAIN NATRIURETIC PEPTIDE - Abnormal; Notable for the following components:   B Natriuretic Peptide 334.3 (*)    All other components within normal limits  COMPREHENSIVE METABOLIC PANEL - Abnormal; Notable for the following components:   Glucose, Bld 117 (*)    Creatinine, Ser 1.01 (*)    Calcium 8.4 (*)    Albumin 3.2 (*)    GFR, Estimated 57 (*)    All other components within normal limits  LACTIC ACID, PLASMA - Abnormal; Notable for the  following components:   Lactic Acid, Venous 2.3 (*)    All other components within normal limits  TROPONIN I (HIGH SENSITIVITY) - Abnormal; Notable for the following components:   Troponin I (High Sensitivity) 52 (*)    All other components within normal limits  TROPONIN I (HIGH SENSITIVITY) - Abnormal; Notable for the following components:   Troponin I (High Sensitivity) 51 (*)    All other components within normal limits  SARS CORONAVIRUS 2 BY RT PCR  CULTURE, BLOOD (ROUTINE X 2)  CULTURE, BLOOD (ROUTINE X 2)  PROCALCITONIN  LACTIC ACID, PLASMA     EKG  ECGs remarkable for elevated junctional rhythm with a ventricular rate of 113 and T wave inversions and ST depressions in lead III, lead II, aVF as well as T wave inversions in leads I and aVL and ST depression in V5 and V6.   RADIOLOGY Chest reviewed by myself shows no focal consoidation, effusion, edema, pneumothorax or other clear acute thoracic process. I also reviewed radiology interpretation and agree with findings described.  I agree with notation of  a lung nodule 1.3 cm in the left apex and recommendation for nonemergent further evaluation with CT.  CT chest my interpretation without evidence of overt edema, large effusion, pneumothorax but there is scattered mucus plugging and nodularity in the left base.  I reviewed radiology interpretation and agree with the findings of small amount of tree-in-bud nodularity in left lower lobe concerning for acute infectious process as well as bronchial wall thickening.  There is also evidence of aortic atherosclerosis.  PROCEDURES:  Critical Care performed: Yes, see critical care procedure note(s)  .Critical Care  Performed by: Lucrezia Starch, MD Authorized by: Lucrezia Starch, MD   Critical care provider statement:    Critical care time (minutes):  30   Critical care was necessary to treat or prevent imminent or life-threatening deterioration of the following conditions:  Sepsis   Critical care was time spent personally by me on the following activities:  Development of treatment plan with patient or surrogate, discussions with consultants, evaluation of patient's response to treatment, examination of patient, ordering and review of laboratory studies, ordering and review of radiographic studies, ordering and performing treatments and interventions, pulse oximetry, re-evaluation of patient's condition and review of old [place] ED: Medications  amLODipine (NORVASC) tablet 5 mg (5 mg Oral Given 07/22/21 1957)  cefTRIAXone (ROCEPHIN) 1 g in sodium chloride 0.9 % 100 mL IVPB (has no administration in time range)  azithromycin (ZITHROMAX) 500 mg in sodium chloride 0.9 % 250 mL IVPB (has no administration in time range)  budesonide (PULMICORT) nebulizer solution 0.5 mg (has no administration in time range)  ipratropium-albuterol (DUONEB) 0.5-2.5 (3) MG/3ML nebulizer solution 3 mL (has no administration in time range)  methylPREDNISolone sodium succinate (SOLU-MEDROL) 125  mg/2 mL injection 125 mg (has no administration in time range)  lactated ringers bolus 500 mL (has no administration in time range)  ipratropium-albuterol (DUONEB) 0.5-2.5 (3) MG/3ML nebulizer solution 3 mL (3 mLs Nebulization Given 07/22/21 1855)     IMPRESSION / MDM / [place] / ED COURSE  I reviewed the triage vital signs and the nursing notes. Patient's presentation is most consistent with acute presentation with potential threat to life or bodily function.  Differential diagnosis includes, but is not limited to COPD exacerbation versus CHF versus pneumonia.  Patient has no chest pain or back pain to suggest a dissection.  ECGs remarkable for elevated junctional rhythm with a ventricular rate of 113 and T wave inversions and ST depressions in lead III, lead II, aVF as well as T wave inversions in leads I and aVL and ST depression in V5 and V6.  This is compared to an ECG from 6/15 that did not have any obvious changes at that time aside from nonspecific change in aVL.  However troponins are down from an hour check a month ago and flat at 52 and 51 which is not suggestive of an acute ischemic event.  I suspect some mild demand ischemia.   Chest reviewed by myself shows no focal consoidation, effusion, edema, pneumothorax or other clear acute thoracic process. I also reviewed radiology interpretation and agree with findings described.  I agree with notation of a lung nodule 1.3 cm in the left apex and recommendation for nonemergent further evaluation with CT.  CBC with WBC count of 13.1 compared to 14.62 weeks ago.  Hemoglobin is 9.9 compared to 10.4.  Platelets are normal.  BNP slightly elevated at 334.3 compared to 995.42-monthago.  CMP without any significant electrolyte or metabolic derangements.  COVID PCR is negative.  Procalcitonin is negative.  CT chest my interpretation without evidence of overt edema, large effusion, pneumothorax but there  is scattered mucus plugging and nodularity in the left base.  I reviewed radiology interpretation and agree with the findings of small amount of tree-in-bud nodularity in left lower lobe concerning for acute infectious process as well as bronchial wall thickening.  There is also evidence of aortic atherosclerosis.  Given findings on CT concerning for infectious process I have a lower suspicion for PE at this time.  We will treat for commune acquired pneumonia with Rocephin and azithromycin.  Patient does not appear septic she does have multiple SIRS criteria with tachycardia tachypnea and leukocytosis possibly reactive in the setting of chronic steroids.  Difficult to exclude sepsis.  Will obtain blood cultures and a lactic acid.  She appears relatively euvolemic.  We will very gently hydrate given her history of CHF.  She reports no improvement in her wheezing or my reassessment and still has some bilateral wheezing and tachypnea.  She does not want to try other DuoNeb but does want to try some Pulmicort.  We will also give some Solu-Medrol and after some discussion she is willing to try Pulmicort with DuoNeb.  Admit to medicine service for further evaluation and management with mild to deep exacerbation in the setting of commune acquired pneumonia.      FINAL CLINICAL IMPRESSION(S) / ED DIAGNOSES   Final diagnoses:  Community acquired pneumonia, unspecified laterality  COPD exacerbation ([place]  SIRS (systemic inflammatory response syndrome) (HCC)  Sepsis, due to unspecified organism, unspecified whether acute organ dysfunction present ([facility]     Rx / DC Orders   ED Discharge Orders     None        Note:  This document was prepared using Dragon voice recognition software and may include unintentional dictation errors.   SLucrezia Starch MD 07/22/21 2029

## 2021-07-22 NOTE — Telephone Encounter (Signed)
I am concerned with her symptoms she may have pneumonia and I think that she needs to be evaluated in the emergency room.

## 2021-07-22 NOTE — ED Notes (Signed)
Katie RN aware of assigned bed

## 2021-07-22 NOTE — Consult Note (Signed)
CODE SEPSIS - PHARMACY COMMUNICATION  **Broad Spectrum Antibiotics should be administered within 1 hour of Sepsis diagnosis**  Time Code Sepsis Called/Page Received: 2035  Antibiotics Ordered: ceftriaxone  Time of 1st antibiotic administration: 2040    Oswald Hillock ,PharmD Clinical Pharmacist  07/22/2021  9:33 PM

## 2021-07-22 NOTE — ED Provider Triage Note (Signed)
Emergency Medicine Provider Triage Evaluation Note  Kerri Carter , a 81 y.o. female  was evaluated in triage.  Pt complains of cough, swelling in legs, recent surgery at the end of April for back.  Review of Systems  Positive: Cough, swelling in legs Negative: Fever chills  Physical Exam  BP (!) 153/78   Pulse (!) 107   Temp 98 F (36.7 C) (Oral)   Resp 16   SpO2 98%  Gen:   Awake, no distress   Resp:  Normal effort  MSK:   Moves extremities without difficulty, swelling is noted distally on both extremities Other:  Cough is very wet, patient is in a brace for her back  Medical Decision Making  Medically screening exam initiated at 5:29 PM.  Appropriate orders placed.  Kerri Carter was informed that the remainder of the evaluation will be completed by another provider, this initial triage assessment does not replace that evaluation, and the importance of remaining in the ED until their evaluation is complete.  Directed nursing staff to insert IV in case a CTA as needed   Versie Starks, PA-C 07/22/21 1731

## 2021-07-22 NOTE — Telephone Encounter (Signed)
Spoke to patient and relayed below message/recommendations. She voiced her understanding.  She will report to ED after her grandson gets off of work at 36 today. I have made Dr. Patsey Berthold aware verbally. Nothing further needed.

## 2021-07-22 NOTE — Assessment & Plan Note (Signed)
-   Resumed home amlodipine 5 mg daily, nebivolol 5 mg p.o. twice daily - Hydralazine 5 mg IV every 6 hours as needed for SBP greater than 175

## 2021-07-23 ENCOUNTER — Telehealth: Payer: Self-pay | Admitting: Pulmonary Disease

## 2021-07-23 DIAGNOSIS — A419 Sepsis, unspecified organism: Secondary | ICD-10-CM | POA: Diagnosis present

## 2021-07-23 DIAGNOSIS — G4733 Obstructive sleep apnea (adult) (pediatric): Secondary | ICD-10-CM | POA: Diagnosis present

## 2021-07-23 DIAGNOSIS — Z20822 Contact with and (suspected) exposure to covid-19: Secondary | ICD-10-CM | POA: Diagnosis present

## 2021-07-23 DIAGNOSIS — F3160 Bipolar disorder, current episode mixed, unspecified: Secondary | ICD-10-CM | POA: Diagnosis present

## 2021-07-23 DIAGNOSIS — K219 Gastro-esophageal reflux disease without esophagitis: Secondary | ICD-10-CM | POA: Diagnosis present

## 2021-07-23 DIAGNOSIS — D631 Anemia in chronic kidney disease: Secondary | ICD-10-CM | POA: Diagnosis present

## 2021-07-23 DIAGNOSIS — Z981 Arthrodesis status: Secondary | ICD-10-CM | POA: Diagnosis not present

## 2021-07-23 DIAGNOSIS — J189 Pneumonia, unspecified organism: Secondary | ICD-10-CM | POA: Diagnosis present

## 2021-07-23 DIAGNOSIS — Z87891 Personal history of nicotine dependence: Secondary | ICD-10-CM | POA: Diagnosis not present

## 2021-07-23 DIAGNOSIS — I739 Peripheral vascular disease, unspecified: Secondary | ICD-10-CM | POA: Diagnosis present

## 2021-07-23 DIAGNOSIS — F419 Anxiety disorder, unspecified: Secondary | ICD-10-CM | POA: Diagnosis present

## 2021-07-23 DIAGNOSIS — J440 Chronic obstructive pulmonary disease with acute lower respiratory infection: Secondary | ICD-10-CM | POA: Diagnosis present

## 2021-07-23 DIAGNOSIS — N1831 Chronic kidney disease, stage 3a: Secondary | ICD-10-CM

## 2021-07-23 DIAGNOSIS — I2510 Atherosclerotic heart disease of native coronary artery without angina pectoris: Secondary | ICD-10-CM | POA: Diagnosis present

## 2021-07-23 DIAGNOSIS — Z8711 Personal history of peptic ulcer disease: Secondary | ICD-10-CM | POA: Diagnosis not present

## 2021-07-23 DIAGNOSIS — Z8249 Family history of ischemic heart disease and other diseases of the circulatory system: Secondary | ICD-10-CM | POA: Diagnosis not present

## 2021-07-23 DIAGNOSIS — E785 Hyperlipidemia, unspecified: Secondary | ICD-10-CM | POA: Diagnosis present

## 2021-07-23 DIAGNOSIS — Z66 Do not resuscitate: Secondary | ICD-10-CM | POA: Diagnosis present

## 2021-07-23 DIAGNOSIS — M069 Rheumatoid arthritis, unspecified: Secondary | ICD-10-CM | POA: Diagnosis present

## 2021-07-23 DIAGNOSIS — J441 Chronic obstructive pulmonary disease with (acute) exacerbation: Secondary | ICD-10-CM | POA: Diagnosis present

## 2021-07-23 DIAGNOSIS — W19XXXA Unspecified fall, initial encounter: Secondary | ICD-10-CM | POA: Diagnosis present

## 2021-07-23 DIAGNOSIS — I129 Hypertensive chronic kidney disease with stage 1 through stage 4 chronic kidney disease, or unspecified chronic kidney disease: Secondary | ICD-10-CM | POA: Diagnosis present

## 2021-07-23 DIAGNOSIS — J9611 Chronic respiratory failure with hypoxia: Secondary | ICD-10-CM | POA: Diagnosis present

## 2021-07-23 DIAGNOSIS — Z9981 Dependence on supplemental oxygen: Secondary | ICD-10-CM | POA: Diagnosis not present

## 2021-07-23 DIAGNOSIS — I700 Atherosclerosis of aorta: Secondary | ICD-10-CM | POA: Diagnosis present

## 2021-07-23 HISTORY — DX: Pneumonia, unspecified organism: J18.9

## 2021-07-23 HISTORY — DX: Chronic kidney disease, stage 3a: N18.31

## 2021-07-23 LAB — RESPIRATORY PANEL BY PCR

## 2021-07-23 LAB — BASIC METABOLIC PANEL
Anion gap: 8 (ref 5–15)
BUN: 14 mg/dL (ref 8–23)
CO2: 24 mmol/L (ref 22–32)
Calcium: 8.2 mg/dL — ABNORMAL LOW (ref 8.9–10.3)
Chloride: 109 mmol/L (ref 98–111)
Creatinine, Ser: 1.01 mg/dL — ABNORMAL HIGH (ref 0.44–1.00)
GFR, Estimated: 57 mL/min — ABNORMAL LOW (ref >60)
Glucose, Bld: 209 mg/dL — ABNORMAL HIGH (ref 70–99)
Potassium: 4.6 mmol/L (ref 3.5–5.1)
Sodium: 141 mmol/L (ref 135–145)

## 2021-07-23 LAB — CBC
HCT: 29.8 % — ABNORMAL LOW (ref 36.0–46.0)
Hemoglobin: 9 g/dL — ABNORMAL LOW (ref 12.0–15.0)
MCH: 24.7 pg — ABNORMAL LOW (ref 26.0–34.0)
MCHC: 30.2 g/dL (ref 30.0–36.0)
MCV: 81.6 fL (ref 80.0–100.0)
Platelets: 294 10*3/uL (ref 150–400)
RBC: 3.65 MIL/uL — ABNORMAL LOW (ref 3.87–5.11)
RDW: 15.8 % — ABNORMAL HIGH (ref 11.5–15.5)
WBC: 10.7 10*3/uL — ABNORMAL HIGH (ref 4.0–10.5)
nRBC: 0 % (ref 0.0–0.2)

## 2021-07-23 LAB — EXPECTORATED SPUTUM ASSESSMENT W GRAM STAIN, RFLX TO RESP C

## 2021-07-23 LAB — STREP PNEUMONIAE URINARY ANTIGEN: Strep Pneumo Urinary Antigen: NEGATIVE

## 2021-07-23 MED ORDER — LAMOTRIGINE 25 MG PO TABS
100.0000 mg | ORAL_TABLET | Freq: Two times a day (BID) | ORAL | Status: DC
  Administered 2021-07-23 – 2021-07-24 (×2): 100 mg via ORAL
  Filled 2021-07-23 (×2): qty 4

## 2021-07-23 MED ORDER — DICYCLOMINE HCL 10 MG PO CAPS
20.0000 mg | ORAL_CAPSULE | Freq: Two times a day (BID) | ORAL | Status: DC
  Administered 2021-07-23 – 2021-07-24 (×3): 20 mg via ORAL
  Filled 2021-07-23 (×3): qty 2

## 2021-07-23 MED ORDER — CYCLOBENZAPRINE HCL 10 MG PO TABS
10.0000 mg | ORAL_TABLET | Freq: Three times a day (TID) | ORAL | Status: DC | PRN
  Administered 2021-07-23: 10 mg via ORAL
  Filled 2021-07-23: qty 1

## 2021-07-23 MED ORDER — BLISTEX MEDICATED EX OINT
TOPICAL_OINTMENT | CUTANEOUS | Status: DC | PRN
  Filled 2021-07-23: qty 6.3

## 2021-07-23 MED ORDER — BENZONATATE 100 MG PO CAPS
100.0000 mg | ORAL_CAPSULE | Freq: Three times a day (TID) | ORAL | Status: DC | PRN
  Administered 2021-07-23 – 2021-07-24 (×3): 100 mg via ORAL
  Filled 2021-07-23 (×3): qty 1

## 2021-07-23 MED ORDER — LIP MEDEX EX OINT: 1.0000 | TOPICAL_OINTMENT | CUTANEOUS | Status: DC | PRN

## 2021-07-23 MED ORDER — IPRATROPIUM-ALBUTEROL 0.5-2.5 (3) MG/3ML IN SOLN
3.0000 mL | Freq: Two times a day (BID) | RESPIRATORY_TRACT | Status: DC
  Administered 2021-07-23 – 2021-07-24 (×2): 3 mL via RESPIRATORY_TRACT
  Filled 2021-07-23 (×2): qty 3

## 2021-07-23 MED ORDER — SODIUM CHLORIDE 0.9 % IV SOLN
500.0000 mg | INTRAVENOUS | Status: DC
  Administered 2021-07-23: 500 mg via INTRAVENOUS
  Filled 2021-07-23: qty 500
  Filled 2021-07-23: qty 5

## 2021-07-23 MED ORDER — SODIUM CHLORIDE 0.9 % IV SOLN
1.0000 g | INTRAVENOUS | Status: DC
  Administered 2021-07-23: 1 g via INTRAVENOUS
  Filled 2021-07-23: qty 1
  Filled 2021-07-23: qty 10

## 2021-07-23 MED ORDER — METHOCARBAMOL 500 MG PO TABS
500.0000 mg | ORAL_TABLET | Freq: Three times a day (TID) | ORAL | Status: DC | PRN
Start: 2021-07-23 — End: 2021-07-24
  Administered 2021-07-24: 500 mg via ORAL
  Filled 2021-07-23: qty 1

## 2021-07-23 MED ORDER — PREDNISONE 20 MG PO TABS
40.0000 mg | ORAL_TABLET | Freq: Every day | ORAL | Status: DC
  Administered 2021-07-23 – 2021-07-24 (×2): 40 mg via ORAL
  Filled 2021-07-23 (×2): qty 2

## 2021-07-23 NOTE — Progress Notes (Signed)
PROGRESS NOTE    Kerri Carter  OXB:[id number] DOB: Jun 29, 1940 DOA: 07/22/2021 PCP: McLean-Scocuzza, Nino Glow, MD  Outpatient Specialists: rheumatology, cardiology, endocrinology, pulmonology, vascular, urology, hematology    Brief Narrative:  From admission h and p Kerri Carter is a 81 year old female history of anxiety with depression, CKD 3A, status post lumbar fusion, COPD, chronic hypoxic respiratory failure requiring 2 L nasal cannula nightly, bipolar disorder, hypertension, GERD, PAD, peptic ulcer disease, L3-L4 disc herniation with severe stenosis, who presents emergency department for chief concerns of shortness of breath.   Assessment & Plan:   Principal Problem:   Community acquired pneumonia Active Problems:   OSA (obstructive sleep apnea)   SOB (shortness of breath)   Rheumatoid arthritis (HCC)   Hyperlipidemia   Anemia   Essential hypertension   Mixed bipolar I disorder (HCC)   Leukocytosis   PAD (peripheral artery disease) (HCC)   COPD exacerbation (HCC)   Anxiety and depression   Sepsis due to pneumonia ([place])   Stage 3a chronic kidney disease (CKD) ([place])  # COPD with exacerbation # Community acquired pneumonia # Chronic hypoxic respiratory failure Two weeks worsening cough and shortness of breath. Treated for CAP inpatient 2 months ago. CT with lower lobe bronchial wall thickening and LLL nodularity suggestive of infection. Procal is normal. No signs fluid overload. Covid negative. On [place] O2 at night at home. - continue ceftriaxone/azithromycin - urine antigens ordered - f/u blood culture, ngtd. Sputum ordered for culture - PT consult - steroids - home inhalers, duonebs - O2 qhs prn  # Sepsis Tachycardia, elevated lactate, leukocytotis. 2/2 CAP. Sepsis physiology resolved, lactate has normalized.  # HTN Here normotensive - cont home amlodipine, nebivolol, pravastatin  # MDD/GAD - home buspar, lexapro  # bipolar - home lamictal, seroquel  #  RA - hold home dmard given acute infection    DVT prophylaxis: lovenox Code Status: dnr Family Communication: son updated teleph  Level of care: Telemetry Medical Status is: Observation The patient will require care spanning > 2 midnights and should be moved to inpatient because: need for IV abx    Consultants:  none  Procedures: none  Antimicrobials:  Ceftriaxone/azithromycin    Subjective: Dyspnea somewhat improved. No chest pain. Cough is dry  Objective: Vitals:   07/22/21 2206 07/23/21 0349 07/23/21 0805 07/23/21 0814  BP: (!) 157/71 114/61  131/73  Pulse: (!) 110 (!) 102 83 88  Resp: '15 15 18 16  '$ Temp: 98.2 F (36.8 C) 98.2 F (36.8 C)  97.9 F (36.6 C)  TempSrc: Oral Oral    SpO2: 96% 97% 97% 98%  Weight:      Height:        Intake/Output Summary (Last 24 hours) at 07/23/2021 0844 Last data filed at 07/23/2021 0816 Gross per 24 hour  Intake 2372.41 ml  Output --  Net 2372.41 ml   Filed Weights   07/22/21 1729  Weight: 78.5 kg    Examination:  General exam: Appears calm and comfortable  Respiratory system: few scattered wheeze/rhonchi Cardiovascular system: S1 & S2 heard, RRR. No JVD, murmurs, rubs, gallops or clicks. No pedal edema. Gastrointestinal system: Abdomen is nondistended, soft and nontender. No organomegaly or masses felt. Normal bowel sounds heard. Central nervous system: Alert and oriented. No focal neurological deficits. Extremities: Symmetric 5 x 5 power. Skin: No rashes, lesions or ulcers Psychiatry: Judgement and insight appear normal. Mood & affect appropriate.     Data Reviewed: I have personally reviewed following  labs and imaging studies  CBC: Recent Labs  Lab 07/22/21 1737 07/23/21 0304  WBC 13.1* 10.7*  NEUTROABS 7.4  --   HGB 9.9* 9.0*  HCT 33.2* 29.8*  MCV 83.4 81.6  PLT 344 542   Basic Metabolic Panel: Recent Labs  Lab 07/22/21 1849 07/23/21 0304  NA 142 141  K 4.7 4.6  CL 109 109  CO2 24 24   GLUCOSE 117* 209*  BUN 14 14  CREATININE 1.01* 1.01*  CALCIUM 8.4* 8.2*   GFR: Estimated Creatinine Clearance: 44.1 mL/min (A) (by C-G formula based on SCr of 1.01 mg/dL (H)). Liver Function Tests: Recent Labs  Lab 07/22/21 1849  AST 27  ALT 13  ALKPHOS 110  BILITOT 0.7  PROT 6.6  ALBUMIN 3.2*   No results for input(s): "LIPASE", "AMYLASE" in the last 168 hours. No results for input(s): "AMMONIA" in the last 168 hours. Coagulation Profile: No results for input(s): "INR", "PROTIME" in the last 168 hours. Cardiac Enzymes: No results for input(s): "CKTOTAL", "CKMB", "CKMBINDEX", "TROPONINI" in the last 168 hours. BNP (last 3 results) No results for input(s): "PROBNP" in the last 8760 hours. HbA1C: No results for input(s): "HGBA1C" in the last 72 hours. CBG: No results for input(s): "GLUCAP" in the last 168 hours. Lipid Profile: No results for input(s): "CHOL", "HDL", "LDLCALC", "TRIG", "CHOLHDL", "LDLDIRECT" in the last 72 hours. Thyroid Function Tests: No results for input(s): "TSH", "T4TOTAL", "FREET4", "T3FREE", "THYROIDAB" in the last 72 hours. Anemia Panel: No results for input(s): "VITAMINB12", "FOLATE", "FERRITIN", "TIBC", "IRON", "RETICCTPCT" in the last 72 hours. Urine analysis:    Component Value Date/Time   COLORURINE YELLOW (A) 05/21/2021 1700   APPEARANCEUR CLOUDY (A) 05/21/2021 1700   APPEARANCEUR Clear 10/12/2017 1410   LABSPEC 1.018 05/21/2021 1700   LABSPEC 1.003 11/24/2013 2117   PHURINE 6.0 05/21/2021 1700   GLUCOSEU NEGATIVE 05/21/2021 1700   GLUCOSEU Negative 11/24/2013 2117   HGBUR NEGATIVE 05/21/2021 1700   BILIRUBINUR NEGATIVE 05/21/2021 1700   BILIRUBINUR Negative 10/12/2017 1410   BILIRUBINUR Negative 11/24/2013 2117   KETONESUR NEGATIVE 05/21/2021 1700   PROTEINUR NEGATIVE 05/21/2021 1700   NITRITE NEGATIVE 05/21/2021 1700   LEUKOCYTESUR NEGATIVE 05/21/2021 1700   LEUKOCYTESUR Negative 11/24/2013 2117   Sepsis  Labs: '@LABRCNTIP'$ (procalcitonin:4,lacticidven:4)  ) Recent Results (from the past 240 hour(s))  SARS Coronavirus 2 by RT PCR (hospital order, performed in [place] hospital lab) *cepheid single result test* Anterior Nasal Swab     Status: None   Collection Time: 07/22/21  6:49 PM   Specimen: Anterior Nasal Swab  Result Value Ref Range Status   SARS Coronavirus 2 by RT PCR NEGATIVE NEGATIVE Final    Comment: (NOTE) SARS-CoV-2 target nucleic acids are NOT DETECTED.  The SARS-CoV-2 RNA is generally detectable in upper and lower respiratory specimens during the acute phase of infection. The lowest concentration of SARS-CoV-2 viral copies this assay can detect is 250 copies / mL. A negative result does not preclude SARS-CoV-2 infection and should not be used as the sole basis for treatment or other patient management decisions.  A negative result may occur with improper specimen collection / handling, submission of specimen other than nasopharyngeal swab, presence of viral mutation(s) within the areas targeted by this assay, and inadequate number of viral copies (<250 copies / mL). A negative result must be combined with clinical observations, patient history, and epidemiological information.  Fact Sheet for Patients:   https://www.patel.info/  Fact Sheet for Healthcare Providers: https://hall.com/  This test is  not yet approved or  cleared by the [place] and has been authorized for detection and/or diagnosis of SARS-CoV-2 by FDA under an Emergency Use Authorization (EUA).  This EUA will remain in effect (meaning this test can be used) for the duration of the COVID-19 declaration under Section 564(b)(1) of the Act, 21 U.S.C. section 360bbb-3(b)(1), unless the authorization is terminated or revoked sooner.  Performed at [facility], [address] [place], [place] [address]   Blood culture (routine x 2)     Status:  None (Preliminary result)   Collection Time: 07/22/21  7:40 PM   Specimen: BLOOD  Result Value Ref Range Status   Specimen Description BLOOD LEFT ASSIST CONTROL  Final   Special Requests   Final    BOTTLES DRAWN AEROBIC AND ANAEROBIC Blood Culture adequate volume   Culture   Final    NO GROWTH < 12 HOURS Performed at [facility], [address] [place], [place] [address]    Report Status PENDING  Incomplete  Blood culture (routine x 2)     Status: None (Preliminary result)   Collection Time: 07/22/21  7:41 PM   Specimen: BLOOD  Result Value Ref Range Status   Specimen Description BLOOD RIGHT HAND  Final   Special Requests   Final    BOTTLES DRAWN AEROBIC AND ANAEROBIC Blood Culture adequate volume   Culture   Final    NO GROWTH < 12 HOURS Performed at [facility], [address], [place], [place] [address]    Report Status PENDING  Incomplete         Radiology Studies: CT Chest Wo Contrast  Result Date: 07/22/2021 CLINICAL DATA:  Respiratory illness cough with leg swelling EXAM: CT CHEST WITHOUT CONTRAST TECHNIQUE: Multidetector CT imaging of the chest was performed following the standard protocol without IV contrast. RADIATION DOSE REDUCTION: This exam was performed according to the departmental dose-optimization program which includes automated exposure control, adjustment of the mA and/or kV according to patient size and/or use of iterative reconstruction technique. COMPARISON:  Chest x-ray 07/22/2021, chest CT 03/21/2011, 05/01/2020 FINDINGS: Cardiovascular: Limited evaluation without intravenous contrast. Moderate aortic atherosclerosis. No aneurysm. Coronary vascular calcifications. No pericardial effusion. Mitral calcification. Mediastinum/Nodes: Midline trachea. No thyroid mass. No suspicious lymph nodes. Esophagus within normal limits. Lungs/Pleura: No consolidation, pleural effusion or pneumothorax. Mild lower lobe bronchial wall thickening with  scattered mucous plugging. Mild tree-in-bud nodularity in the left lower lobe, series 3 image 79. Upper Abdomen: No acute abnormality. Musculoskeletal: No acute osseous abnormality. Previously noted nodular opacity may correspond to lead attachment on the left upper anterior chest wall. IMPRESSION: 1. No suspicious pulmonary nodule to correspond to the questioned radiographic abnormality. 2. Mild lower lobe bronchial wall thickening with scattered mucous plugging. Small amount of tree-in-bud nodularity in the left lower lobe suggesting respiratory infection. Aortic Atherosclerosis (ICD10-I70.0). Electronically Signed   By: Donavan Foil M.D.   On: 07/22/2021 19:50   DG Chest 2 View  Result Date: 07/22/2021 CLINICAL DATA:  Cough and swelling in the legs EXAM: CHEST - 2 VIEW COMPARISON:  Chest radiograph 05/29/2021 FINDINGS: The cardiomediastinal silhouette is normal. The lungs hyperinflated with flattening of the diaphragms consistent with underlying COPD. There is no focal consolidation or pulmonary edema. Previously seen opacities in the left lung have significantly improved/resolved. There is no pleural effusion or pneumothorax. There is a 1.3 cm nodular opacity in the left apex not seen on prior studies. Bilateral shoulder arthroplasty hardware, left clavicle  surgical fixation, and lumbar spine fusion hardware are noted. Opacities projecting over the right fifth and sixth ribs are consistent with reflect old fractures. IMPRESSION: 1. No radiographic evidence of acute cardiopulmonary process. Previously seen opacities in the left lung on the study from 05/29/2021 have resolved. 2. 1.3 cm nodular opacity in the left apex not seen on prior studies. Recommend nonemergent chest CT to evaluate for a pulmonary nodule. Electronically Signed   By: Valetta Mole M.D.   On: 07/22/2021 18:06        Scheduled Meds:  amLODipine  5 mg Oral Daily   budesonide  0.25 mg Nebulization Daily   busPIRone  5 mg Oral BID    cholecalciferol  4,000 Units Oral Daily   clopidogrel  75 mg Oral Daily   enoxaparin (LOVENOX) injection  40 mg Subcutaneous Q24H   escitalopram  10 mg Oral Daily   famotidine  20 mg Oral Daily   fluticasone  2 spray Each Nare Daily   gabapentin  600 mg Oral BID   ipratropium-albuterol  3 mL Nebulization TID   lamoTRIgine  150 mg Oral BID   leflunomide  20 mg Oral Daily   mirabegron ER  50 mg Oral Daily   montelukast  10 mg Oral Daily   nebivolol  5 mg Oral BID   pantoprazole  40 mg Oral BID   pravastatin  20 mg Oral q1800   QUEtiapine  25 mg Oral QHS   sucralfate  1 g Oral BID   Continuous Infusions:  azithromycin     cefTRIAXone (ROCEPHIN)  IV     lactated ringers 125 mL/hr at 07/23/21 0816     LOS: 0 days     Desma Maxim, MD [facility]   If 7PM-7AM, please contact night-coverage [email or web address] Password [facility] 07/23/2021, 8:44 AM

## 2021-07-23 NOTE — Telephone Encounter (Signed)
Noted  

## 2021-07-23 NOTE — Evaluation (Signed)
Physical Therapy Evaluation Patient Details Name: PEARL BENTS MRN: [id number] DOB: Mar 11, 1940 Today's Date: 07/23/2021  History of Present Illness  81 year old female history of anxiety with depression, CKD 3A, status post lumbar fusion, COPD, chronic hypoxic respiratory failure requiring 2 L nasal cannula nightly, bipolar disorder, hypertension, GERD, PAD, peptic ulcer disease, L3-L4 disc herniation with severe stenosis, who presents emergency department for chief concerns of shortness of breath.  Clinical Impression  Pt did well with PT exam but was unable to tolerate "prolonged" bout of ambulation.  She was safe with walker for ~40 ft but have severe DOE with needs to sit and, despite O2 in the high 90s she was calling out "Give me some oxygen, give me some oxygen."  Pt with non-productive cough t/o the session.  She will benefit from continued PT services, recommending to continue to HHPT.       Recommendations for follow up therapy are one component of a multi-disciplinary discharge planning process, led by the attending physician.  Recommendations may be updated based on patient status, additional functional criteria and insurance authorization.  Follow Up Recommendations Home health PT      Assistance Recommended at Discharge None  Patient can return home with the following  A little help with bathing/dressing/bathroom;A little help with walking and/or transfers;Assistance with cooking/housework;Assist for transportation    Equipment Recommendations Rolling walker (2 wheels)  Recommendations for Other Services       Functional Status Assessment Patient has had a recent decline in their functional status and demonstrates the ability to make significant improvements in function in a reasonable and predictable amount of time.     Precautions / Restrictions Precautions Precautions: Back Required Braces or Orthoses: Spinal Brace Spinal Brace: Thoracolumbosacral orthotic;Applied in  sitting position (from sx 05/23/21,) Restrictions Weight Bearing Restrictions: No      Mobility  Bed Mobility Overal bed mobility: Needs Assistance Bed Mobility: Supine to Sit     Supine to sit: Min guard     General bed mobility comments: Pt was able to transition to sitting EOB w/o assist, able to transition slowly but with confidence    Transfers Overall transfer level: Independent Equipment used: Rolling walker (2 wheels)               General transfer comment: Minimal reminders for hand placement and sequencing    Ambulation/Gait Ambulation/Gait assistance: Min guard Gait Distance (Feet): 40 Feet Assistive device: Rolling walker (2 wheels)         General Gait Details: Pt with safe and, initially, confident gait however she started to fatigue and have some DOE leaning to self-reported anxiety and rapid need to get to seated.  Her O2 (on room air) remained >98%, HR did get to nearly 120 with pt reporting significant fatigue and indicates that she rarely walks much more than that, albeit w/o the acute fatigue.  Stairs            Wheelchair Mobility    Modified Rankin (Stroke Patients Only)       Balance Overall balance assessment: Modified Independent                                           Pertinent Vitals/Pain Pain Assessment Pain Assessment: 0-10 Pain Score: 3  Pain Location: back pain    Home Living Family/patient expects to be discharged to:: Private residence  Living Arrangements: Other relatives (g-son, his wife and their child) Available Help at Discharge: Family;Available PRN/intermittently (pt alone ~10 hr/day) Type of Home: House Home Access: Ramped entrance       Home Layout: One level Home Equipment: Rollator (4 wheels);BSC/3in1;Shower seat;Toilet riser;Grab bars - toilet;Hand held shower head;Grab bars - tub/shower;Adaptive equipment      Prior Function Prior Level of Function : Independent/Modified  Independent;History of Falls (last six months)             Mobility Comments: Pt states she does not typically walk more than 30-40 ft room to room but can occasionally get out of the home with family       Hand Dominance        Extremity/Trunk Assessment   Upper Extremity Assessment Upper Extremity Assessment: Generalized weakness (limited shld elevation b/l)    Lower Extremity Assessment Lower Extremity Assessment: Generalized weakness       Communication   Communication: HOH  Cognition Arousal/Alertness: Awake/alert Behavior During Therapy: Restless Overall Cognitive Status: Within Functional Limits for tasks assessed                                          General Comments      Exercises     Assessment/Plan    PT Assessment Patient needs continued PT services  PT Problem List Decreased strength;Decreased activity tolerance;Decreased range of motion;Decreased balance;Decreased mobility;Decreased knowledge of use of DME;Decreased safety awareness;Cardiopulmonary status limiting activity;Pain       PT Treatment Interventions DME instruction;Gait training;Stair training;Functional mobility training;Therapeutic activities;Therapeutic exercise;Balance training;Patient/family education    PT Goals (Current goals can be found in the Care Plan section)  Acute Rehab PT Goals Patient Stated Goal: go home PT Goal Formulation: With patient Time For Goal Achievement: 08/05/21 Potential to Achieve Goals: Good    Frequency Min 2X/week     Co-evaluation               AM-PAC PT "6 Clicks" Mobility  Outcome Measure Help needed turning from your back to your side while in a flat bed without using bedrails?: None Help needed moving from lying on your back to sitting on the side of a flat bed without using bedrails?: None Help needed moving to and from a bed to a chair (including a wheelchair)?: A Little Help needed standing up from a chair  using your arms (e.g., wheelchair or bedside chair)?: A Little Help needed to walk in hospital room?: A Little Help needed climbing 3-5 steps with a railing? : A Little 6 Click Score: 20    End of Session Equipment Utilized During Treatment: Gait belt Activity Tolerance: Patient limited by fatigue Patient left: with chair alarm set;with call bell/phone within reach Nurse Communication: Mobility status PT Visit Diagnosis: Muscle weakness (generalized) (M62.81);Difficulty in walking, not elsewhere classified (R26.2)    Time: 4696-2952 PT Time Calculation (min) (ACUTE ONLY): 31 min   Charges:   PT Evaluation $PT Eval Low Complexity: 1 Low PT Treatments $Gait Training: 8-22 mins        Kreg Shropshire, DPT 07/23/2021, 4:16 PM

## 2021-07-23 NOTE — Plan of Care (Signed)

## 2021-07-23 NOTE — Telephone Encounter (Signed)
ATC patient--unable to leave vm due to mailbox being full.   Routing to Dr. Patsey Berthold to make aware.

## 2021-07-23 NOTE — Telephone Encounter (Signed)
Last OV note has been faxed to [facility] at [phone].  Janett Billow with [facility] is aware and voiced her understanding.  Nothing further needed.

## 2021-07-23 NOTE — Progress Notes (Signed)
Sepsis tracking by eLINK 

## 2021-07-24 DIAGNOSIS — J189 Pneumonia, unspecified organism: Secondary | ICD-10-CM | POA: Diagnosis not present

## 2021-07-24 LAB — CBC
HCT: 27.3 % — ABNORMAL LOW (ref 36.0–46.0)
Hemoglobin: 8.2 g/dL — ABNORMAL LOW (ref 12.0–15.0)
MCH: 24.6 pg — ABNORMAL LOW (ref 26.0–34.0)
MCHC: 30 g/dL (ref 30.0–36.0)
MCV: 81.7 fL (ref 80.0–100.0)
Platelets: 281 10*3/uL (ref 150–400)
RBC: 3.34 MIL/uL — ABNORMAL LOW (ref 3.87–5.11)
RDW: 15.8 % — ABNORMAL HIGH (ref 11.5–15.5)
WBC: 16 10*3/uL — ABNORMAL HIGH (ref 4.0–10.5)
nRBC: 0 % (ref 0.0–0.2)

## 2021-07-24 LAB — BASIC METABOLIC PANEL
Anion gap: 6 (ref 5–15)
BUN: 21 mg/dL (ref 8–23)
CO2: 24 mmol/L (ref 22–32)
Calcium: 8.3 mg/dL — ABNORMAL LOW (ref 8.9–10.3)
Chloride: 109 mmol/L (ref 98–111)
Creatinine, Ser: 0.99 mg/dL (ref 0.44–1.00)
GFR, Estimated: 58 mL/min — ABNORMAL LOW (ref >60)
Glucose, Bld: 117 mg/dL — ABNORMAL HIGH (ref 70–99)
Potassium: 4.1 mmol/L (ref 3.5–5.1)
Sodium: 139 mmol/L (ref 135–145)

## 2021-07-24 MED ORDER — AMOXICILLIN-POT CLAVULANATE 875-125 MG PO TABS: 1.0000 | ORAL_TABLET | Freq: Two times a day (BID) | ORAL | 0 refills | Status: DC

## 2021-07-24 MED ORDER — IPRATROPIUM-ALBUTEROL 0.5-2.5 (3) MG/3ML IN SOLN
RESPIRATORY_TRACT | Status: AC
  Administered 2021-07-24: 3 mL
  Filled 2021-07-24: qty 3

## 2021-07-24 MED ORDER — BUDESONIDE 0.5 MG/2ML IN SUSP
RESPIRATORY_TRACT | Status: AC
  Administered 2021-07-24: 0.5 mg
  Filled 2021-07-24: qty 2

## 2021-07-24 MED ORDER — IPRATROPIUM-ALBUTEROL 0.5-2.5 (3) MG/3ML IN SOLN
3.0000 mL | RESPIRATORY_TRACT | Status: DC | PRN
Start: 2021-07-24 — End: 2021-07-24

## 2021-07-24 MED ORDER — PREDNISONE 20 MG PO TABS: 40.0000 mg | ORAL_TABLET | Freq: Every day | ORAL | 0 refills | Status: DC

## 2021-07-24 NOTE — Plan of Care (Signed)

## 2021-07-24 NOTE — Plan of Care (Signed)
  Problem: Education: Goal: Knowledge of General Education information will improve Description: Including pain rating scale, medication(s)/side effects and non-pharmacologic comfort measures 07/24/2021 1226 by Alferd Apa, RN Outcome: Adequate for Discharge 07/24/2021 0755 by Alferd Apa, RN Outcome: Progressing   Problem: Health Behavior/Discharge Planning: Goal: Ability to manage health-related needs will improve 07/24/2021 1226 by Alferd Apa, RN Outcome: Adequate for Discharge 07/24/2021 0755 by Alferd Apa, RN Outcome: Progressing   Problem: Clinical Measurements: Goal: Ability to maintain clinical measurements within normal limits will improve 07/24/2021 1226 by Alferd Apa, RN Outcome: Adequate for Discharge 07/24/2021 0755 by Alferd Apa, RN Outcome: Progressing Goal: Will remain free from infection 07/24/2021 1226 by Alferd Apa, RN Outcome: Adequate for Discharge 07/24/2021 0755 by Alferd Apa, RN Outcome: Progressing Goal: Diagnostic test results will improve 07/24/2021 1226 by Alferd Apa, RN Outcome: Adequate for Discharge 07/24/2021 0755 by Alferd Apa, RN Outcome: Progressing Goal: Respiratory complications will improve 07/24/2021 1226 by Alferd Apa, RN Outcome: Adequate for Discharge 07/24/2021 0755 by Alferd Apa, RN Outcome: Progressing Goal: Cardiovascular complication will be avoided 07/24/2021 1226 by Alferd Apa, RN Outcome: Adequate for Discharge 07/24/2021 0755 by Alferd Apa, RN Outcome: Progressing   Problem: Activity: Goal: Risk for activity intolerance will decrease 07/24/2021 1226 by Alferd Apa, RN Outcome: Adequate for Discharge 07/24/2021 0755 by Alferd Apa, RN Outcome: Progressing   Problem: Nutrition: Goal: Adequate nutrition will be maintained 07/24/2021 1226 by Alferd Apa, RN Outcome: Adequate for Discharge 07/24/2021 0755 by Alferd Apa, RN Outcome: Progressing   Problem: Coping: Goal: Level of anxiety  will decrease 07/24/2021 1226 by Alferd Apa, RN Outcome: Adequate for Discharge 07/24/2021 0755 by Alferd Apa, RN Outcome: Progressing   Problem: Elimination: Goal: Will not experience complications related to bowel motility 07/24/2021 1226 by Alferd Apa, RN Outcome: Adequate for Discharge 07/24/2021 0755 by Alferd Apa, RN Outcome: Progressing Goal: Will not experience complications related to urinary retention 07/24/2021 1226 by Alferd Apa, RN Outcome: Adequate for Discharge 07/24/2021 0755 by Alferd Apa, RN Outcome: Progressing   Problem: Pain Managment: Goal: General experience of comfort will improve 07/24/2021 1226 by Alferd Apa, RN Outcome: Adequate for Discharge 07/24/2021 0755 by Alferd Apa, RN Outcome: Progressing   Problem: Safety: Goal: Ability to remain free from injury will improve 07/24/2021 1226 by Alferd Apa, RN Outcome: Adequate for Discharge 07/24/2021 0755 by Alferd Apa, RN Outcome: Progressing   Problem: Skin Integrity: Goal: Risk for impaired skin integrity will decrease 07/24/2021 1226 by Alferd Apa, RN Outcome: Adequate for Discharge 07/24/2021 0755 by Alferd Apa, RN Outcome: Progressing

## 2021-07-24 NOTE — TOC Progression Note (Signed)
Transition of Care [facility]) - Progression Note    Patient Details  Name: Kerri Carter MRN: [id number] Date of Birth: Feb 11, 1940  [facility]) CM/SW Contact  Eileen Stanford, LCSW Phone Number: 07/24/2021, 12:20 PM  Clinical Narrative:   Pt is active with [facility], OT, RN, and Aid. [facility] aware of pt dc.          Expected Discharge Plan and Services                                                 Social Determinants of Health (SDOH) Interventions    Readmission Risk Interventions    05/24/2021    2:35 PM  Readmission Risk Prevention Plan  Transportation Screening Complete  PCP or Specialist Appt within 3-5 Days Complete  HRI or [place] Complete  Social Work Consult for [place] Planning/Counseling Complete  Palliative Care Screening Not Applicable  Medication Review [employer]) Complete

## 2021-07-25 ENCOUNTER — Telehealth: Payer: Self-pay

## 2021-07-25 ENCOUNTER — Telehealth: Payer: Self-pay | Admitting: Pulmonary Disease

## 2021-07-25 LAB — LEGIONELLA PNEUMOPHILA SEROGP 1 UR AG: L. pneumophila Serogp 1 Ur Ag: NEGATIVE

## 2021-07-25 IMAGING — CT CT L SPINE W/O CM
3 of 4 series · 12 of 33 positions shown, 14 images · non-contrast
Comparison: MRI lumbar spine 07/26/2020. CT lumbar spine 02/21/2019

CLINICAL DATA: Low back pain

EXAM:
CT LUMBAR SPINE WITHOUT CONTRAST
TECHNIQUE: Multidetector CT imaging of the lumbar spine was performed without
intravenous contrast administration. Multiplanar CT image
reconstructions were also generated.

[Series 4: l spine soft (person_name) · axial · 0.30mm/px · z∈[-613,-461]mm · 4 of 112 slices shown, 5 images]
[im 18/112  soft-tissue]
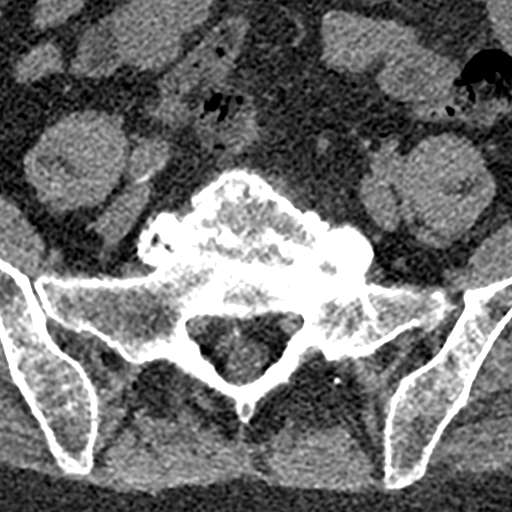
[im 18/112  bone]
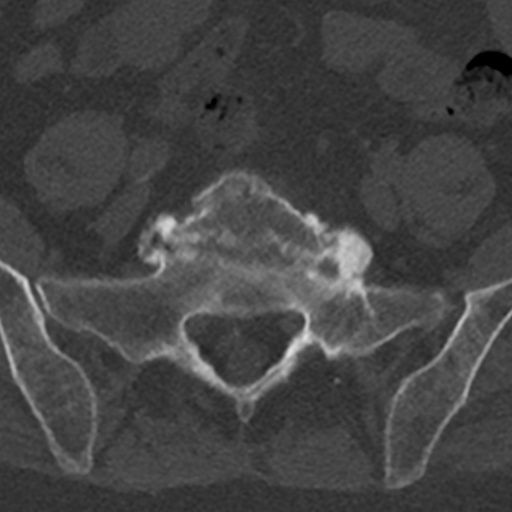
[im 43/112  bone]
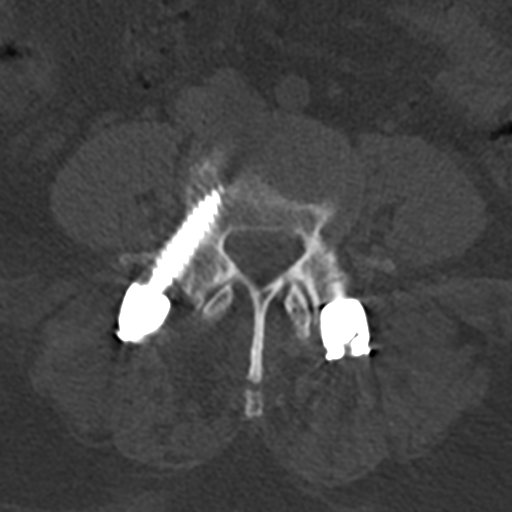
[im 69/112  bone]
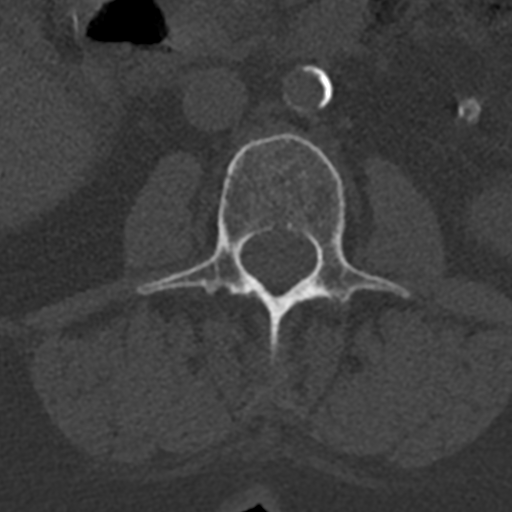
[im 94/112  bone]
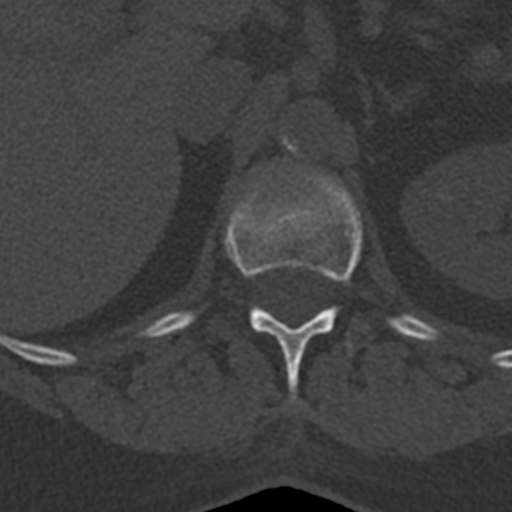

[Series 6: sagittal st · sagittal · 0.29mm/px · 5 of 73 slices shown, 6 images]
[im 25/73  bone]
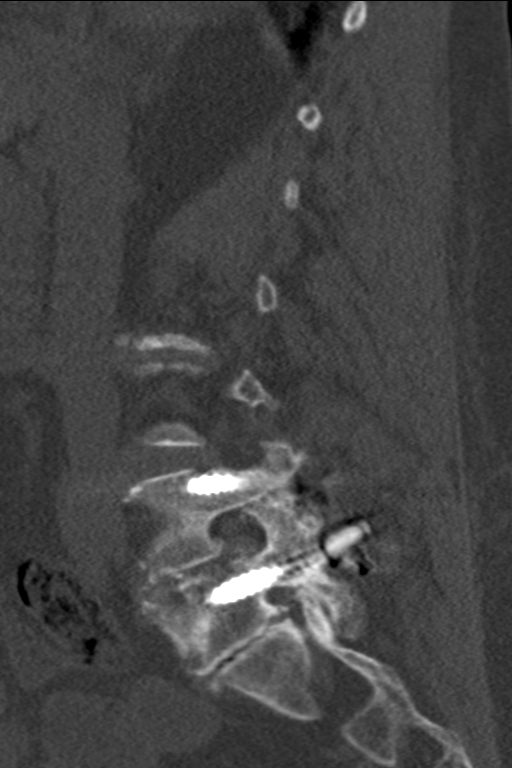
[im 31/73  bone]
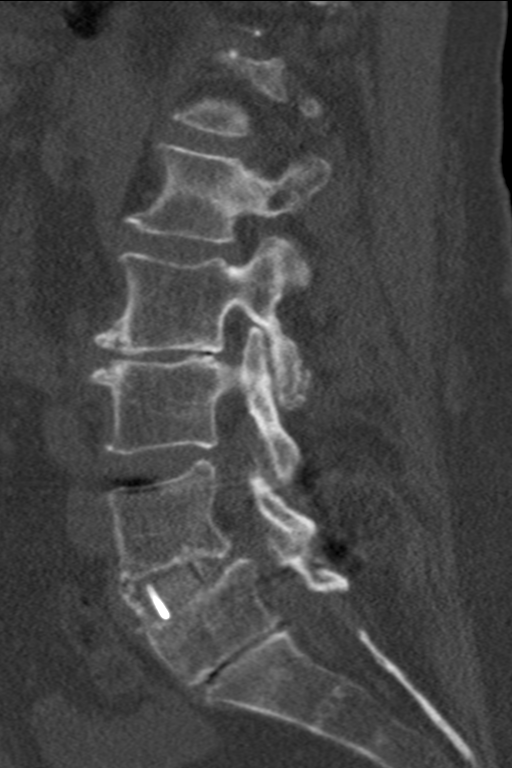
[im 37/73  soft-tissue]
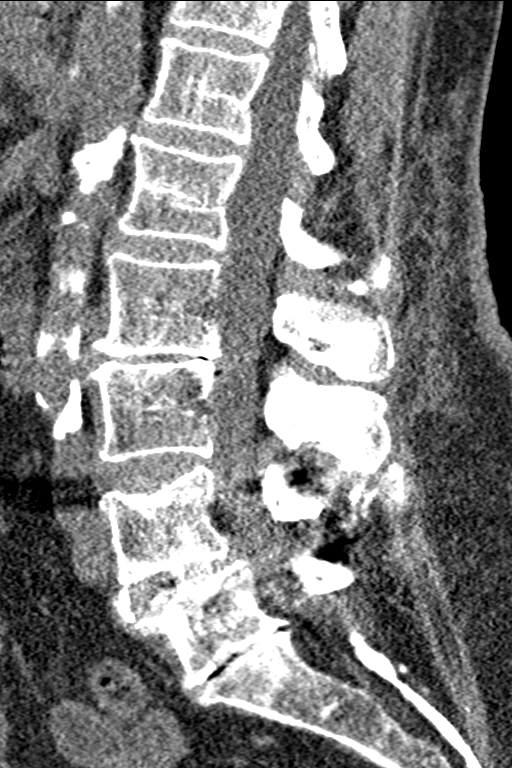
[im 37/73  bone]
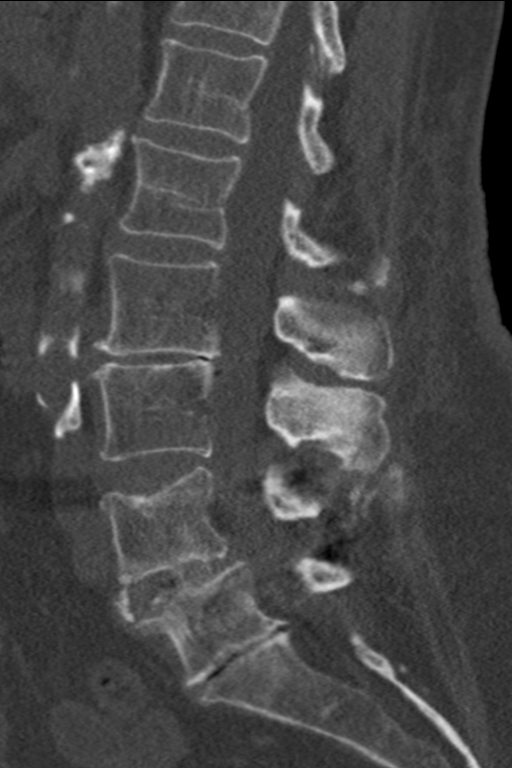
[im 43/73  bone]
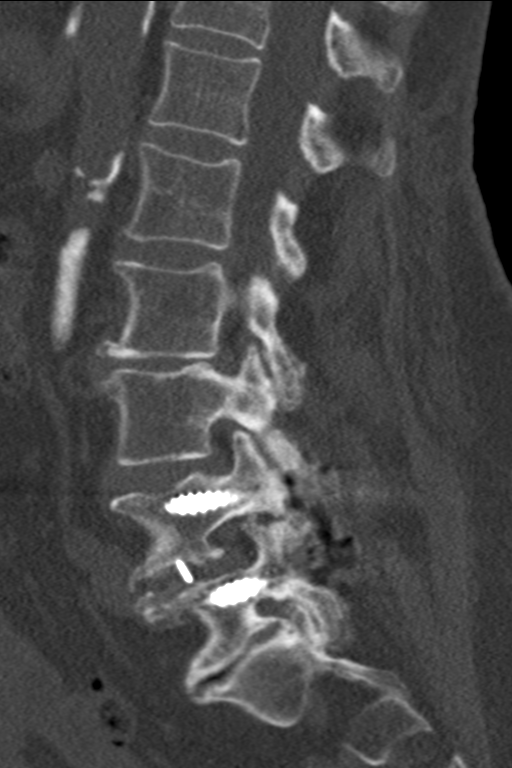
[im 49/73  bone]
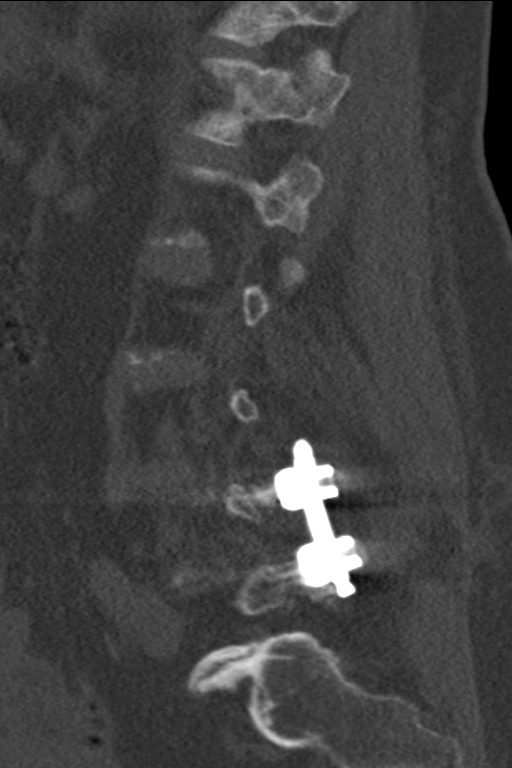

[Series 7: coronal bone · coronal · 0.28mm/px · 3 of 75 slices shown]
[im 19/75  bone]
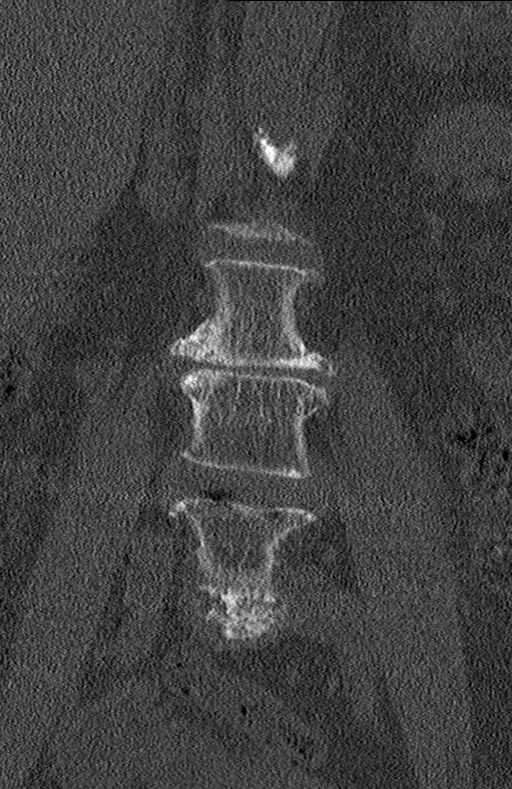
[im 38/75  bone]
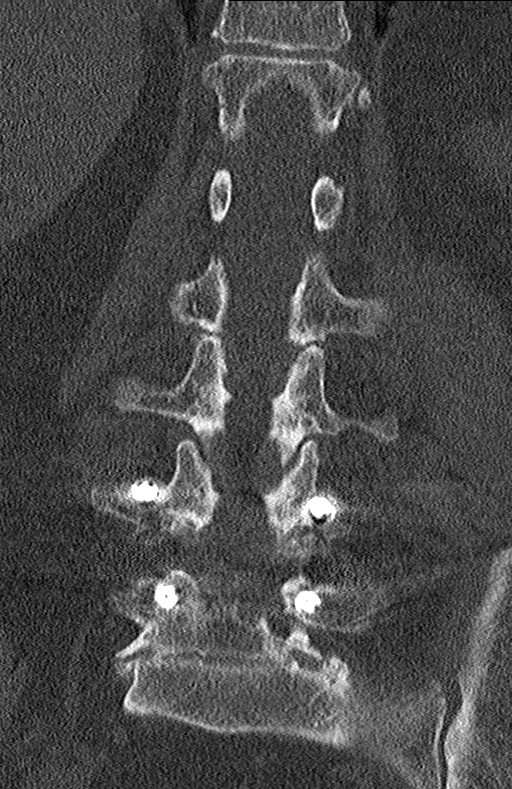
[im 56/75  bone]
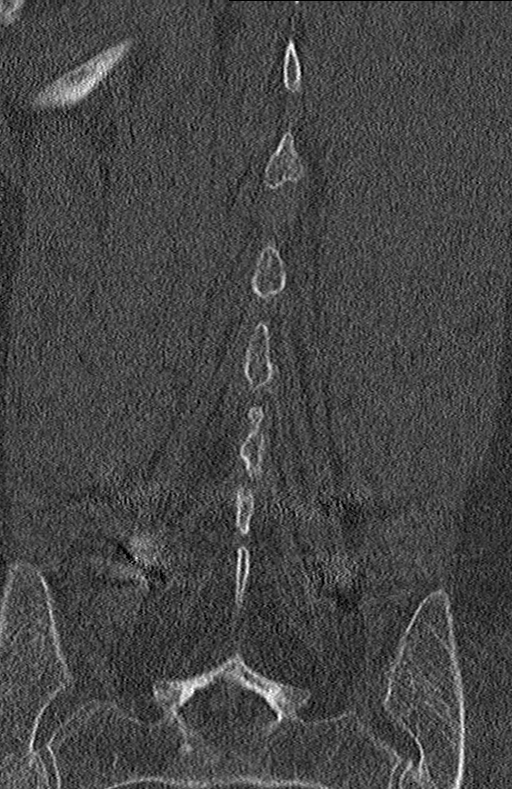

[12 of 33 positions shown; findings below may reference images not displayed]

FINDINGS: Segmentation: Standard

Alignment: 2 mm anterolisthesis L4-5 unchanged.

Vertebrae: Superior endplate fracture of L4 on the left is unchanged
from the prior CT. No acute fracture or mass lesion.

Paraspinal and other soft tissues: Atherosclerotic calcification
aorta and iliacs. Negative for aneurysm. Negative for paraspinous
mass or adenopathy.

Disc levels: T12-L1: Mild disc degeneration.  Negative for stenosis

L1-2: Mild disc bulging.  Negative for stenosis

L2-3: Moderate disc degeneration with disc space narrowing.
Moderately large right-sided disc protrusion, partially calcified
and similar to the prior CT. There is diffuse bulging of the disc.
Bilateral facet and ligamentum flavum hypertrophy. Moderate spinal
stenosis. Right L3 nerve root impingement in the subarticular zone
similar to the prior study.

L3-4: Disc and facet degeneration. Diffuse disc bulging. No
significant spinal stenosis. Mild subarticular stenosis bilaterally

L4-5: Pedicle screw and interbody fusion. Interbody bone graft
appears partially incorporated with solid arthrodesis noted.
Hardware in satisfactory position. No significant spinal or
foraminal stenosis. Left laminectomy.

L5-S1: Advanced disc degeneration with marked disc space narrowing.
Diffuse endplate spurring causing moderate foraminal stenosis
bilaterally. No interval change.
IMPRESSION: Moderately large right-sided disc protrusion at L2-3 with disc
calcification unchanged from the prior CT. Right L3 nerve root
impingement.

Pedicle screw and interbody fusion L4-5. Bony fusion appears solid.
No significant stenosis

Moderate foraminal encroachment bilaterally L5-S1 due to vertebral
endplate spurring. No change from 9590 CT.

## 2021-07-25 IMAGING — MR MR LUMBAR SPINE W/O CM
5 series · 31 of 48 positions shown · non-contrast
Comparison: MRI lumbar spine 08/04/2016, CT lumbar spine 07/26/2020

CLINICAL DATA: Low back pain.

EXAM:
MRI LUMBAR SPINE WITHOUT CONTRAST
TECHNIQUE: Multiplanar, multisequence MR imaging of the lumbar spine was
performed. No intravenous contrast was administered.

[Series 5: T2 · sagittal · 4.0mm · 0.81mm/px · 6 of 17 slices shown (1 of 2)]
[im 1/17]
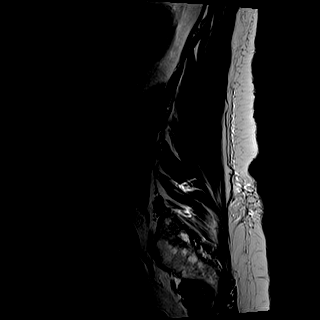
[im 4/17]
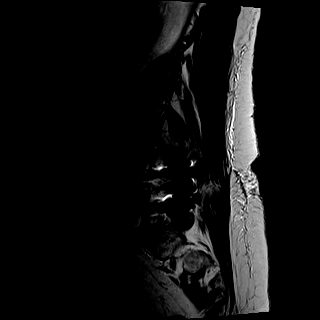
[im 7/17]
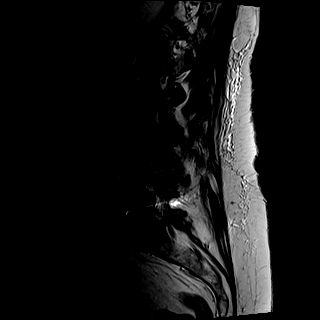
[im 10/17]
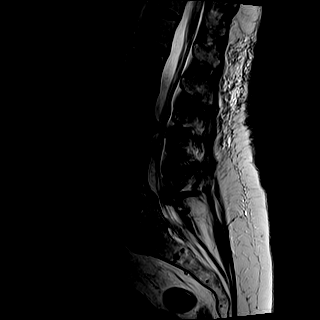
[im 13/17]
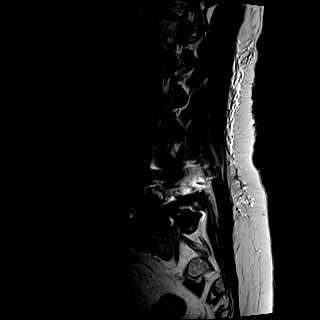
[im 17/17]
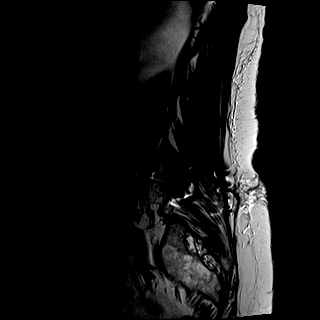

[Series 6: T1 · sagittal · 4.0mm · 0.81mm/px · 7 of 17 slices shown (1 of 2)]
[im 1/17]
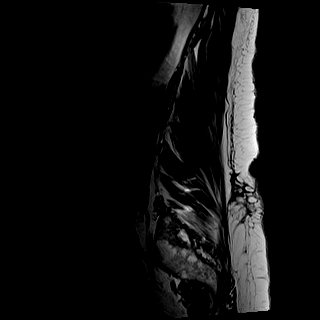
[im 3/17]
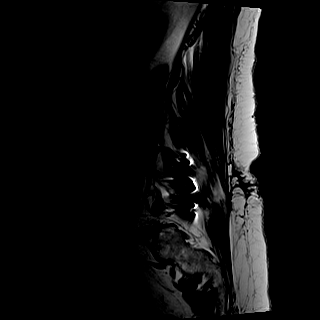
[im 6/17]
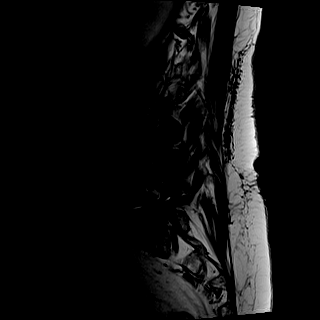
[im 9/17]
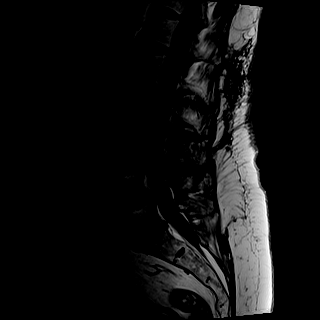
[im 11/17]
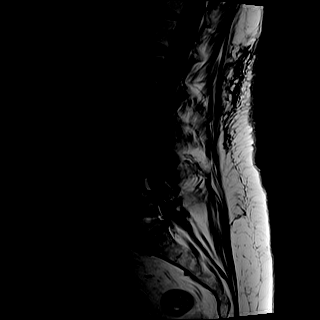
[im 14/17]
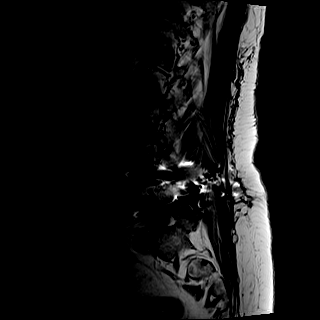
[im 17/17]
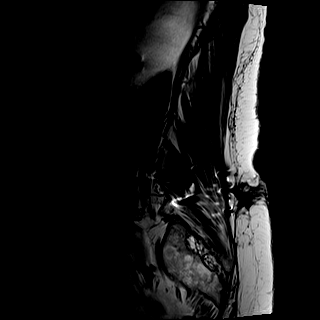

[Series 7: STIR · sagittal · 4.0mm · 0.41mm/px · 2 of 17 slices shown]
[im 1/17]
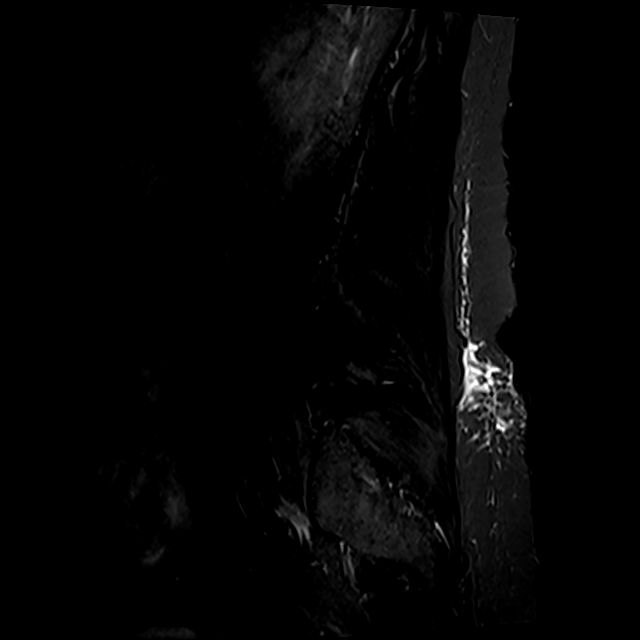
[im 3/17]
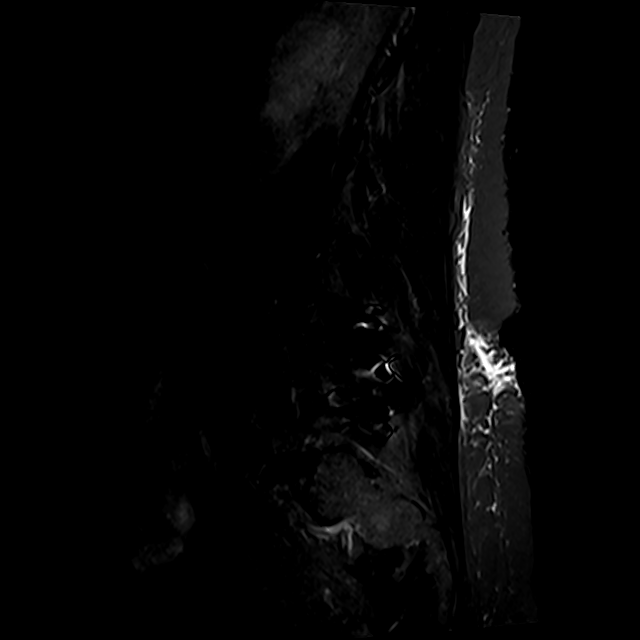

[Series 10: T2 · axial · 4.0mm · 0.78mm/px · z∈[-139,+74]mm · 8 of 35 slices shown (2 of 2)]
[im 1/35]
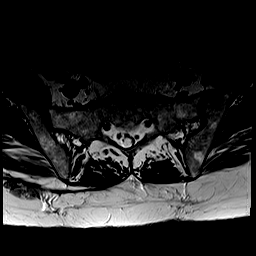
[im 6/35]
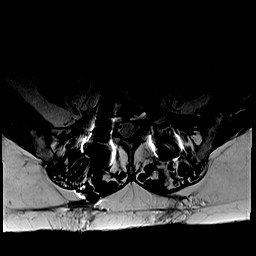
[im 11/35]
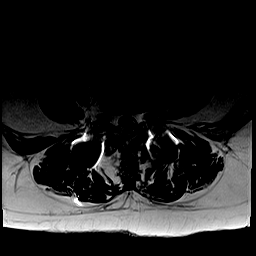
[im 16/35]
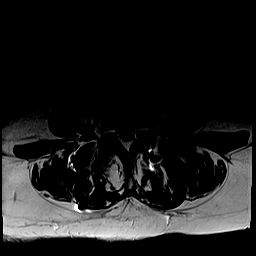
[im 19/35]
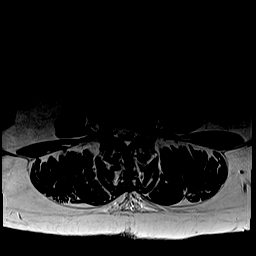
[im 24/35]
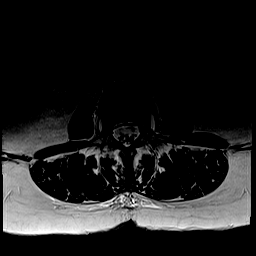
[im 29/35]
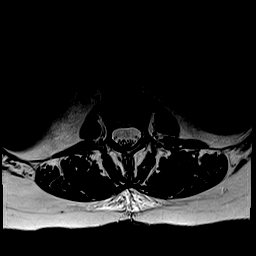
[im 35/35]
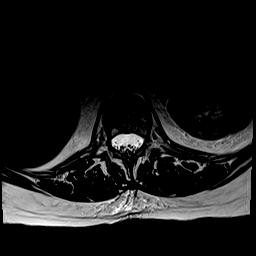

[Series 13: T1 · axial · 4.0mm · 0.39mm/px · z∈[-139,+74]mm · 8 of 35 slices shown (2 of 2)]
[im 1/35]
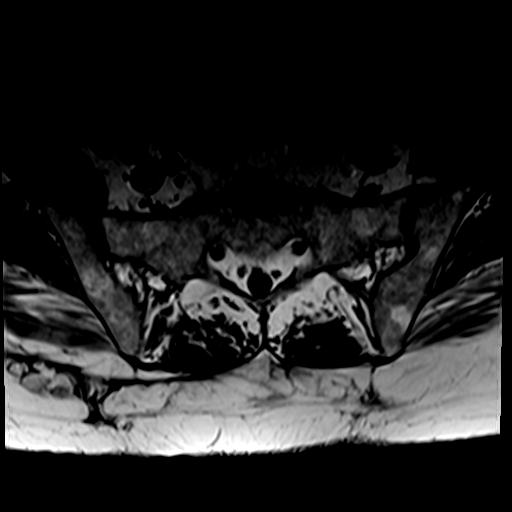
[im 6/35]
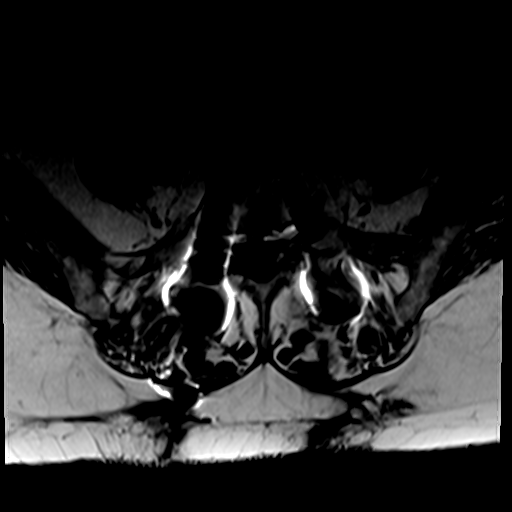
[im 11/35]
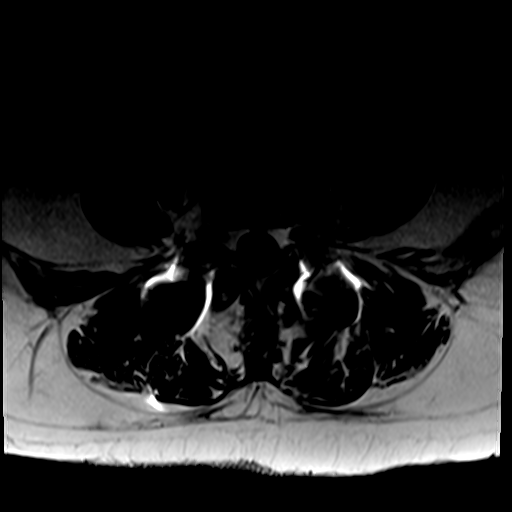
[im 16/35]
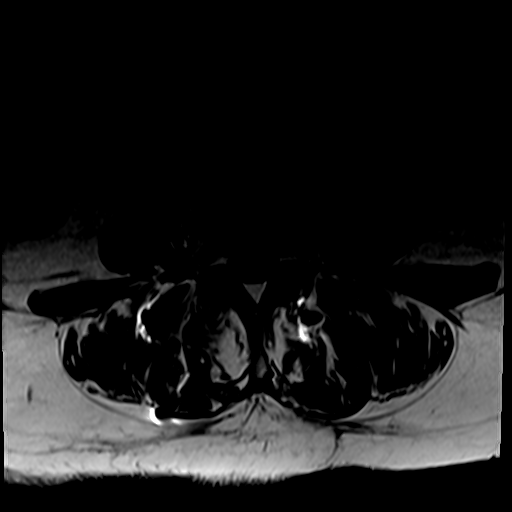
[im 19/35]
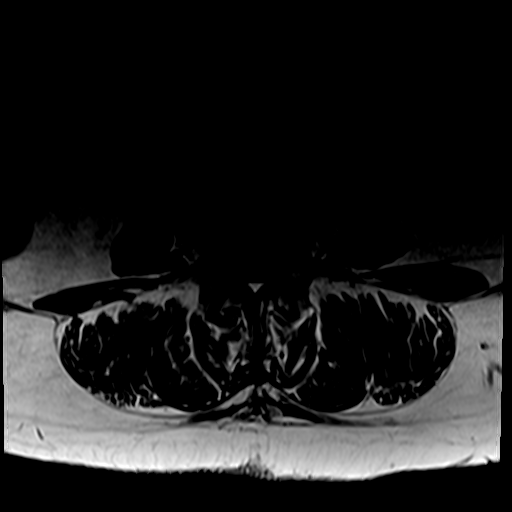
[im 24/35]
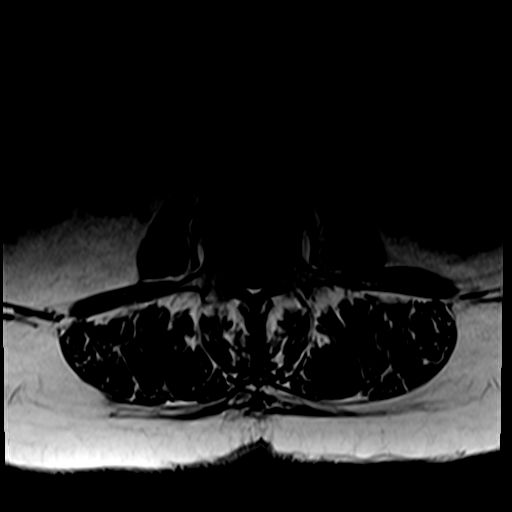
[im 29/35]
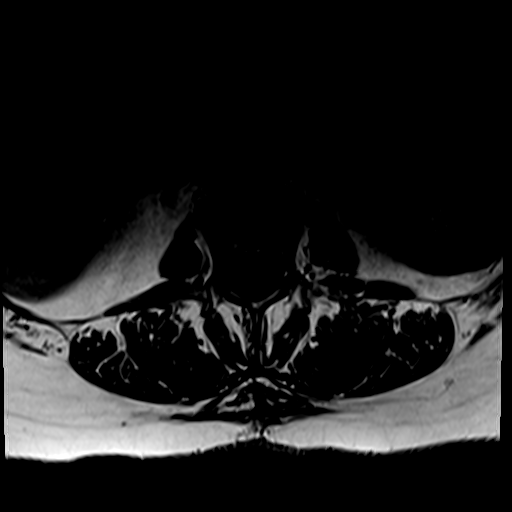
[im 35/35]
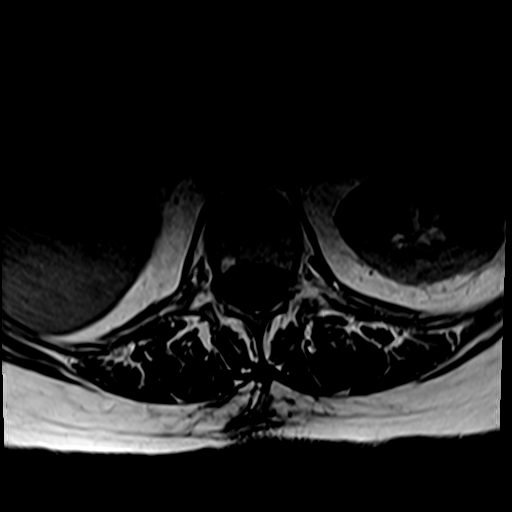

[31 of 48 positions shown; findings below may reference images not displayed]

FINDINGS: Segmentation:  Standard

Alignment:  Mild anterolisthesis L4-5.

Vertebrae:  Negative for fracture or mass.  No bone marrow edema.

Conus medullaris and cauda equina: Conus extends to the T12 level.
Conus and cauda equina appear normal.

Paraspinal and other soft tissues: Negative for paraspinous mass or
adenopathy.

Disc levels:

L1-2: Mild disc degeneration.  Negative for stenosis

L2-3: Moderate to advanced disc space narrowing. Moderately large
right paracentral disc protrusion with subarticular stenosis on the
right. Right L3 nerve root impingement. Bilateral facet hypertrophy.
Moderate spinal stenosis. Moderate left subarticular stenosis. Mild
foraminal narrowing bilaterally.

L3-4: Disc degeneration with disc bulging and facet degeneration.
Small synovial cyst on the left approximately 2 x 5 mm contributing
to left subarticular stenosis. Spinal canal adequate in size.

L4-5: PLIF. Mild spinal stenosis. Mild subarticular stenosis
bilaterally.

L5-S1: Marked disc space narrowing with diffuse endplate spurring.
Bilateral L5 nerve root impingement due to foraminal encroachment
bilaterally.
IMPRESSION: Moderate spinal stenosis L2-3. Right-sided disc protrusion with
right L3 nerve root impingement.

Small synovial cyst on the left at L3-4 contributing to mild left
subarticular stenosis. Question left L4 nerve root symptoms.

PLIF L4-5 with mild spinal stenosis

Moderate foraminal encroachment bilaterally L5-S1 due to spurring.

## 2021-07-25 NOTE — Telephone Encounter (Signed)
Patient returned Elisha Ponder, LPN's call.

## 2021-07-25 NOTE — Telephone Encounter (Signed)
Spoke to patient.  She is requesting HFU.  First available is not until August. She would like sooner appt. Declined [place] appt.  She was discharged yesterday but feels that that she was discharged too soon. C/o increased SOB and dry cough. She is unable to walk one step due to SOB.   Dr. Jayme Cloud, please advise. Thanks

## 2021-07-25 NOTE — Telephone Encounter (Signed)
Patient is aware of below message/recommendations and voiced her understanding.  Appt scheduled 07/30/2021 at 3:30. Nothing further needed.

## 2021-07-26 LAB — CULTURE, RESPIRATORY W GRAM STAIN
Culture: NORMAL
Gram Stain: NONE SEEN

## 2021-07-27 LAB — CULTURE, BLOOD (ROUTINE X 2)
Culture: NO GROWTH
Culture: NO GROWTH
Special Requests: ADEQUATE
Special Requests: ADEQUATE

## 2021-07-28 ENCOUNTER — Other Ambulatory Visit: Payer: Self-pay

## 2021-07-28 ENCOUNTER — Inpatient Hospital Stay
Admission: EM | Admit: 2021-07-28 | Discharge: 2021-07-31 | DRG: 291 | Disposition: A | Discharge status: Home Health | Payer: Medicare PPO | Attending: Internal Medicine | Admitting: Internal Medicine

## 2021-07-28 ENCOUNTER — Emergency Department: Payer: Medicare PPO

## 2021-07-28 ENCOUNTER — Inpatient Hospital Stay: Payer: Medicare PPO | Attending: Oncology | Admitting: Oncology

## 2021-07-28 DIAGNOSIS — Z833 Family history of diabetes mellitus: Secondary | ICD-10-CM | POA: Diagnosis not present

## 2021-07-28 DIAGNOSIS — Z7902 Long term (current) use of antithrombotics/antiplatelets: Secondary | ICD-10-CM

## 2021-07-28 DIAGNOSIS — M069 Rheumatoid arthritis, unspecified: Secondary | ICD-10-CM | POA: Diagnosis present

## 2021-07-28 DIAGNOSIS — Z7901 Long term (current) use of anticoagulants: Secondary | ICD-10-CM

## 2021-07-28 DIAGNOSIS — Z96611 Presence of right artificial shoulder joint: Secondary | ICD-10-CM | POA: Diagnosis present

## 2021-07-28 DIAGNOSIS — Z96612 Presence of left artificial shoulder joint: Secondary | ICD-10-CM | POA: Diagnosis not present

## 2021-07-28 DIAGNOSIS — R059 Cough, unspecified: Secondary | ICD-10-CM | POA: Diagnosis not present

## 2021-07-28 DIAGNOSIS — N1831 Chronic kidney disease, stage 3a: Secondary | ICD-10-CM | POA: Diagnosis present

## 2021-07-28 DIAGNOSIS — Z823 Family history of stroke: Secondary | ICD-10-CM

## 2021-07-28 DIAGNOSIS — Z8249 Family history of ischemic heart disease and other diseases of the circulatory system: Secondary | ICD-10-CM | POA: Diagnosis not present

## 2021-07-28 DIAGNOSIS — D649 Anemia, unspecified: Secondary | ICD-10-CM | POA: Diagnosis present

## 2021-07-28 DIAGNOSIS — I5043 Acute on chronic combined systolic (congestive) and diastolic (congestive) heart failure: Secondary | ICD-10-CM | POA: Diagnosis not present

## 2021-07-28 DIAGNOSIS — Z8673 Personal history of transient ischemic attack (TIA), and cerebral infarction without residual deficits: Secondary | ICD-10-CM

## 2021-07-28 DIAGNOSIS — Z811 Family history of alcohol abuse and dependence: Secondary | ICD-10-CM

## 2021-07-28 DIAGNOSIS — I509 Heart failure, unspecified: Secondary | ICD-10-CM

## 2021-07-28 DIAGNOSIS — Z96643 Presence of artificial hip joint, bilateral: Secondary | ICD-10-CM | POA: Diagnosis present

## 2021-07-28 DIAGNOSIS — Z20822 Contact with and (suspected) exposure to covid-19: Secondary | ICD-10-CM | POA: Diagnosis present

## 2021-07-28 DIAGNOSIS — Z96651 Presence of right artificial knee joint: Secondary | ICD-10-CM | POA: Diagnosis present

## 2021-07-28 DIAGNOSIS — Z800 Family history of malignant neoplasm of digestive organs: Secondary | ICD-10-CM

## 2021-07-28 DIAGNOSIS — R0902 Hypoxemia: Principal | ICD-10-CM

## 2021-07-28 DIAGNOSIS — Z7951 Long term (current) use of inhaled steroids: Secondary | ICD-10-CM

## 2021-07-28 DIAGNOSIS — I2510 Atherosclerotic heart disease of native coronary artery without angina pectoris: Secondary | ICD-10-CM | POA: Diagnosis present

## 2021-07-28 DIAGNOSIS — Z820 Family history of epilepsy and other diseases of the nervous system: Secondary | ICD-10-CM

## 2021-07-28 DIAGNOSIS — Z905 Acquired absence of kidney: Secondary | ICD-10-CM

## 2021-07-28 DIAGNOSIS — J9601 Acute respiratory failure with hypoxia: Secondary | ICD-10-CM | POA: Diagnosis present

## 2021-07-28 DIAGNOSIS — I130 Hypertensive heart and chronic kidney disease with heart failure and stage 1 through stage 4 chronic kidney disease, or unspecified chronic kidney disease: Principal | ICD-10-CM | POA: Diagnosis present

## 2021-07-28 DIAGNOSIS — J441 Chronic obstructive pulmonary disease with (acute) exacerbation: Secondary | ICD-10-CM | POA: Diagnosis present

## 2021-07-28 DIAGNOSIS — Z66 Do not resuscitate: Secondary | ICD-10-CM | POA: Diagnosis not present

## 2021-07-28 DIAGNOSIS — I10 Essential (primary) hypertension: Secondary | ICD-10-CM | POA: Diagnosis present

## 2021-07-28 DIAGNOSIS — Z825 Family history of asthma and other chronic lower respiratory diseases: Secondary | ICD-10-CM

## 2021-07-28 DIAGNOSIS — N179 Acute kidney failure, unspecified: Secondary | ICD-10-CM | POA: Diagnosis present

## 2021-07-28 DIAGNOSIS — I480 Paroxysmal atrial fibrillation: Secondary | ICD-10-CM | POA: Diagnosis present

## 2021-07-28 DIAGNOSIS — Z801 Family history of malignant neoplasm of trachea, bronchus and lung: Secondary | ICD-10-CM

## 2021-07-28 DIAGNOSIS — I110 Hypertensive heart disease with heart failure: Secondary | ICD-10-CM | POA: Diagnosis not present

## 2021-07-28 DIAGNOSIS — F3160 Bipolar disorder, current episode mixed, unspecified: Secondary | ICD-10-CM | POA: Diagnosis not present

## 2021-07-28 DIAGNOSIS — J9621 Acute and chronic respiratory failure with hypoxia: Secondary | ICD-10-CM | POA: Diagnosis not present

## 2021-07-28 DIAGNOSIS — R0689 Other abnormalities of breathing: Secondary | ICD-10-CM | POA: Diagnosis not present

## 2021-07-28 DIAGNOSIS — E785 Hyperlipidemia, unspecified: Secondary | ICD-10-CM | POA: Diagnosis present

## 2021-07-28 DIAGNOSIS — J189 Pneumonia, unspecified organism: Secondary | ICD-10-CM | POA: Diagnosis not present

## 2021-07-28 DIAGNOSIS — G4733 Obstructive sleep apnea (adult) (pediatric): Secondary | ICD-10-CM | POA: Diagnosis present

## 2021-07-28 DIAGNOSIS — R0602 Shortness of breath: Secondary | ICD-10-CM

## 2021-07-28 DIAGNOSIS — Z807 Family history of other malignant neoplasms of lymphoid, hematopoietic and related tissues: Secondary | ICD-10-CM

## 2021-07-28 DIAGNOSIS — Z818 Family history of other mental and behavioral disorders: Secondary | ICD-10-CM

## 2021-07-28 DIAGNOSIS — M810 Age-related osteoporosis without current pathological fracture: Secondary | ICD-10-CM | POA: Diagnosis present

## 2021-07-28 DIAGNOSIS — I739 Peripheral vascular disease, unspecified: Secondary | ICD-10-CM | POA: Diagnosis not present

## 2021-07-28 DIAGNOSIS — K219 Gastro-esophageal reflux disease without esophagitis: Secondary | ICD-10-CM | POA: Diagnosis present

## 2021-07-28 DIAGNOSIS — I4891 Unspecified atrial fibrillation: Secondary | ICD-10-CM | POA: Diagnosis not present

## 2021-07-28 DIAGNOSIS — J449 Chronic obstructive pulmonary disease, unspecified: Secondary | ICD-10-CM | POA: Diagnosis present

## 2021-07-28 DIAGNOSIS — D509 Iron deficiency anemia, unspecified: Secondary | ICD-10-CM | POA: Diagnosis present

## 2021-07-28 DIAGNOSIS — J9610 Chronic respiratory failure, unspecified whether with hypoxia or hypercapnia: Secondary | ICD-10-CM | POA: Diagnosis not present

## 2021-07-28 DIAGNOSIS — Z87891 Personal history of nicotine dependence: Secondary | ICD-10-CM

## 2021-07-28 DIAGNOSIS — I340 Nonrheumatic mitral (valve) insufficiency: Secondary | ICD-10-CM | POA: Diagnosis not present

## 2021-07-28 DIAGNOSIS — I5033 Acute on chronic diastolic (congestive) heart failure: Secondary | ICD-10-CM | POA: Diagnosis not present

## 2021-07-28 DIAGNOSIS — I350 Nonrheumatic aortic (valve) stenosis: Secondary | ICD-10-CM | POA: Diagnosis not present

## 2021-07-28 DIAGNOSIS — Z471 Aftercare following joint replacement surgery: Secondary | ICD-10-CM | POA: Diagnosis not present

## 2021-07-28 DIAGNOSIS — Z9981 Dependence on supplemental oxygen: Secondary | ICD-10-CM

## 2021-07-28 DIAGNOSIS — Z803 Family history of malignant neoplasm of breast: Secondary | ICD-10-CM

## 2021-07-28 DIAGNOSIS — Z981 Arthrodesis status: Secondary | ICD-10-CM

## 2021-07-28 DIAGNOSIS — F411 Generalized anxiety disorder: Secondary | ICD-10-CM | POA: Diagnosis present

## 2021-07-28 DIAGNOSIS — Z79899 Other long term (current) drug therapy: Secondary | ICD-10-CM

## 2021-07-28 DIAGNOSIS — R062 Wheezing: Secondary | ICD-10-CM | POA: Diagnosis not present

## 2021-07-28 DIAGNOSIS — Z7952 Long term (current) use of systemic steroids: Secondary | ICD-10-CM

## 2021-07-28 DIAGNOSIS — R7989 Other specified abnormal findings of blood chemistry: Secondary | ICD-10-CM | POA: Diagnosis not present

## 2021-07-28 DIAGNOSIS — R5381 Other malaise: Secondary | ICD-10-CM | POA: Diagnosis present

## 2021-07-28 HISTORY — DX: Acute on chronic combined systolic (congestive) and diastolic (congestive) heart failure: I50.43

## 2021-07-28 LAB — BLOOD GAS, VENOUS
Acid-Base Excess: 2.2 mmol/L — ABNORMAL HIGH (ref 0.0–2.0)
Bicarbonate: 25.1 mmol/L (ref 20.0–28.0)
Delivery systems: POSITIVE
FIO2: 40 %
O2 Saturation: 97.6 %
Patient temperature: 37
pCO2, Ven: 33 mmHg — ABNORMAL LOW (ref 44–60)
pH, Ven: 7.49 — ABNORMAL HIGH (ref 7.25–7.43)
pO2, Ven: 123 mmHg — ABNORMAL HIGH (ref 32–45)

## 2021-07-28 LAB — CBC WITH DIFFERENTIAL/PLATELET
Abs Immature Granulocytes: 0.41 10*3/uL — ABNORMAL HIGH (ref 0.00–0.07)
Basophils Absolute: 0 10*3/uL (ref 0.0–0.1)
Basophils Relative: 0 %
Eosinophils Absolute: 0.1 10*3/uL (ref 0.0–0.5)
Eosinophils Relative: 1 %
HCT: 31 % — ABNORMAL LOW (ref 36.0–46.0)
Hemoglobin: 9.4 g/dL — ABNORMAL LOW (ref 12.0–15.0)
Immature Granulocytes: 3 %
Lymphocytes Relative: 16 %
Lymphs Abs: 2.4 10*3/uL (ref 0.7–4.0)
MCH: 24.4 pg — ABNORMAL LOW (ref 26.0–34.0)
MCHC: 30.3 g/dL (ref 30.0–36.0)
MCV: 80.3 fL (ref 80.0–100.0)
Monocytes Absolute: 0.4 10*3/uL (ref 0.1–1.0)
Monocytes Relative: 3 %
Neutro Abs: 11.7 10*3/uL — ABNORMAL HIGH (ref 1.7–7.7)
Neutrophils Relative %: 77 %
Platelets: 379 10*3/uL (ref 150–400)
RBC: 3.86 MIL/uL — ABNORMAL LOW (ref 3.87–5.11)
RDW: 16.2 % — ABNORMAL HIGH (ref 11.5–15.5)
WBC: 15.1 10*3/uL — ABNORMAL HIGH (ref 4.0–10.5)
nRBC: 0.1 % (ref 0.0–0.2)

## 2021-07-28 LAB — RESP PANEL BY RT-PCR (FLU A&B, COVID) ARPGX2
Influenza A by PCR: NEGATIVE
Influenza B by PCR: NEGATIVE
SARS Coronavirus 2 by RT PCR: NEGATIVE

## 2021-07-28 LAB — BRAIN NATRIURETIC PEPTIDE: B Natriuretic Peptide: 670.5 pg/mL — ABNORMAL HIGH (ref 0.0–100.0)

## 2021-07-28 LAB — LACTIC ACID, PLASMA
Lactic Acid, Venous: 1.4 mmol/L (ref 0.5–1.9)
Lactic Acid, Venous: 1 mmol/L (ref 0.5–1.9)

## 2021-07-28 LAB — TROPONIN I (HIGH SENSITIVITY)
Troponin I (High Sensitivity): 77 ng/L — ABNORMAL HIGH (ref <18)
Troponin I (High Sensitivity): 69 ng/L — ABNORMAL HIGH (ref <18)

## 2021-07-28 LAB — PROCALCITONIN: Procalcitonin: <0.1 ng/mL

## 2021-07-28 MED ORDER — CYCLOBENZAPRINE HCL 10 MG PO TABS
10.0000 mg | ORAL_TABLET | Freq: Three times a day (TID) | ORAL | Status: DC | PRN
  Filled 2021-07-28: qty 1

## 2021-07-28 MED ORDER — VITAMIN D 25 MCG (1000 UNIT) PO TABS
4000.0000 [IU] | ORAL_TABLET | Freq: Every day | ORAL | Status: DC
  Administered 2021-07-29 – 2021-07-31 (×3): 4000 [IU] via ORAL
  Filled 2021-07-28 (×3): qty 4

## 2021-07-28 MED ORDER — PREDNISONE 20 MG PO TABS: 40.0000 mg | ORAL_TABLET | Freq: Every day | ORAL | Status: DC

## 2021-07-28 MED ORDER — SODIUM CHLORIDE 0.9 % IV SOLN: 250.0000 mL | INTRAVENOUS | Status: DC | PRN

## 2021-07-28 MED ORDER — OXYCODONE-ACETAMINOPHEN 5-325 MG PO TABS: 1.0000 | ORAL_TABLET | ORAL | Status: DC | PRN

## 2021-07-28 MED ORDER — MONTELUKAST SODIUM 10 MG PO TABS
10.0000 mg | ORAL_TABLET | Freq: Every day | ORAL | Status: DC
  Administered 2021-07-29 – 2021-07-31 (×3): 10 mg via ORAL
  Filled 2021-07-28 (×4): qty 1

## 2021-07-28 MED ORDER — CLOPIDOGREL BISULFATE 75 MG PO TABS
75.0000 mg | ORAL_TABLET | Freq: Every day | ORAL | Status: DC
  Administered 2021-07-29: 75 mg via ORAL
  Filled 2021-07-28: qty 1

## 2021-07-28 MED ORDER — PRAVASTATIN SODIUM 20 MG PO TABS
20.0000 mg | ORAL_TABLET | Freq: Every day | ORAL | Status: DC
  Administered 2021-07-28 – 2021-07-30 (×3): 20 mg via ORAL
  Filled 2021-07-28 (×3): qty 1

## 2021-07-28 MED ORDER — ENOXAPARIN SODIUM 40 MG/0.4ML IJ SOSY
40.0000 mg | PREFILLED_SYRINGE | INTRAMUSCULAR | Status: DC
  Administered 2021-07-28: 40 mg via SUBCUTANEOUS
  Filled 2021-07-28: qty 0.4

## 2021-07-28 MED ORDER — GABAPENTIN 300 MG PO CAPS
600.0000 mg | ORAL_CAPSULE | Freq: Two times a day (BID) | ORAL | Status: DC
  Administered 2021-07-28 – 2021-07-31 (×6): 600 mg via ORAL
  Filled 2021-07-28 (×6): qty 2

## 2021-07-28 MED ORDER — PREDNISONE 20 MG PO TABS
20.0000 mg | ORAL_TABLET | Freq: Every day | ORAL | Status: DC
Start: 2021-07-29 — End: 2021-07-30
  Administered 2021-07-29 – 2021-07-30 (×2): 20 mg via ORAL
  Filled 2021-07-28 (×2): qty 1

## 2021-07-28 MED ORDER — IPRATROPIUM-ALBUTEROL 0.5-2.5 (3) MG/3ML IN SOLN
3.0000 mL | Freq: Four times a day (QID) | RESPIRATORY_TRACT | Status: DC
Start: 2021-07-28 — End: 2021-07-30
  Administered 2021-07-28 – 2021-07-30 (×6): 3 mL via RESPIRATORY_TRACT
  Filled 2021-07-28 (×6): qty 3

## 2021-07-28 MED ORDER — SODIUM CHLORIDE 0.9% FLUSH
3.0000 mL | Freq: Two times a day (BID) | INTRAVENOUS | Status: DC
Start: 2021-07-28 — End: 2021-07-31
  Administered 2021-07-28 – 2021-07-31 (×6): 3 mL via INTRAVENOUS

## 2021-07-28 MED ORDER — SODIUM CHLORIDE 0.9 % IV SOLN
500.0000 mg | Freq: Once | INTRAVENOUS | Status: DC
  Filled 2021-07-28: qty 5

## 2021-07-28 MED ORDER — NEBIVOLOL HCL 5 MG PO TABS
5.0000 mg | ORAL_TABLET | ORAL | Status: DC
  Administered 2021-07-28 – 2021-07-31 (×6): 5 mg via ORAL
  Filled 2021-07-28 (×6): qty 1

## 2021-07-28 MED ORDER — PIPERACILLIN-TAZOBACTAM 3.375 G IVPB 30 MIN
3.3750 g | Freq: Once | INTRAVENOUS | Status: AC
  Administered 2021-07-28: 3.375 g via INTRAVENOUS
  Filled 2021-07-28: qty 50

## 2021-07-28 MED ORDER — FUROSEMIDE 10 MG/ML IJ SOLN
40.0000 mg | Freq: Three times a day (TID) | INTRAMUSCULAR | Status: DC
  Administered 2021-07-28 – 2021-07-29 (×2): 40 mg via INTRAVENOUS
  Filled 2021-07-28 (×2): qty 4

## 2021-07-28 MED ORDER — BUSPIRONE HCL 10 MG PO TABS
5.0000 mg | ORAL_TABLET | Freq: Two times a day (BID) | ORAL | Status: DC
  Administered 2021-07-28 – 2021-07-31 (×6): 5 mg via ORAL
  Filled 2021-07-28 (×6): qty 1

## 2021-07-28 MED ORDER — PANTOPRAZOLE SODIUM 40 MG PO TBEC
40.0000 mg | DELAYED_RELEASE_TABLET | Freq: Two times a day (BID) | ORAL | Status: DC
  Administered 2021-07-28 – 2021-07-31 (×6): 40 mg via ORAL
  Filled 2021-07-28 (×6): qty 1

## 2021-07-28 MED ORDER — METHOCARBAMOL 500 MG PO TABS
500.0000 mg | ORAL_TABLET | Freq: Four times a day (QID) | ORAL | Status: DC | PRN
  Administered 2021-07-29: 500 mg via ORAL
  Filled 2021-07-28: qty 1

## 2021-07-28 MED ORDER — ONDANSETRON HCL 4 MG/2ML IJ SOLN: 4.0000 mg | Freq: Four times a day (QID) | INTRAMUSCULAR | Status: DC | PRN

## 2021-07-28 MED ORDER — ETODOLAC 400 MG PO TABS
400.0000 mg | ORAL_TABLET | Freq: Every day | ORAL | Status: DC
  Filled 2021-07-28: qty 1

## 2021-07-28 MED ORDER — LAMOTRIGINE 100 MG PO TABS
100.0000 mg | ORAL_TABLET | Freq: Two times a day (BID) | ORAL | Status: DC
  Administered 2021-07-28 – 2021-07-31 (×6): 100 mg via ORAL
  Filled 2021-07-28 (×6): qty 1

## 2021-07-28 MED ORDER — QUETIAPINE FUMARATE 25 MG PO TABS
25.0000 mg | ORAL_TABLET | Freq: Every day | ORAL | Status: DC
  Administered 2021-07-28 – 2021-07-30 (×3): 25 mg via ORAL
  Filled 2021-07-28 (×3): qty 1

## 2021-07-28 MED ORDER — FLUTICASONE PROPIONATE 50 MCG/ACT NA SUSP
2.0000 | Freq: Every day | NASAL | Status: DC | PRN
  Filled 2021-07-28: qty 16

## 2021-07-28 MED ORDER — MIRABEGRON ER 50 MG PO TB24
50.0000 mg | ORAL_TABLET | Freq: Every day | ORAL | Status: DC
  Administered 2021-07-29 – 2021-07-31 (×3): 50 mg via ORAL
  Filled 2021-07-28 (×3): qty 1

## 2021-07-28 MED ORDER — SUCRALFATE 1 G PO TABS
1.0000 g | ORAL_TABLET | Freq: Two times a day (BID) | ORAL | Status: DC
  Administered 2021-07-29 – 2021-07-31 (×5): 1 g via ORAL
  Filled 2021-07-28 (×5): qty 1

## 2021-07-28 MED ORDER — ACETAMINOPHEN 325 MG PO TABS: 650.0000 mg | ORAL_TABLET | ORAL | Status: DC | PRN

## 2021-07-28 MED ORDER — AMLODIPINE BESYLATE 5 MG PO TABS
5.0000 mg | ORAL_TABLET | Freq: Every day | ORAL | Status: DC
  Administered 2021-07-29 – 2021-07-31 (×3): 5 mg via ORAL
  Filled 2021-07-28 (×3): qty 1

## 2021-07-28 MED ORDER — ALBUTEROL SULFATE (2.5 MG/3ML) 0.083% IN NEBU: 3.0000 mL | INHALATION_SOLUTION | Freq: Four times a day (QID) | RESPIRATORY_TRACT | Status: DC | PRN

## 2021-07-28 MED ORDER — ESCITALOPRAM OXALATE 10 MG PO TABS
10.0000 mg | ORAL_TABLET | Freq: Every day | ORAL | Status: DC
  Administered 2021-07-29 – 2021-07-31 (×3): 10 mg via ORAL
  Filled 2021-07-28 (×4): qty 1

## 2021-07-28 MED ORDER — BUDESONIDE 0.25 MG/2ML IN SUSP
0.2500 mg | Freq: Two times a day (BID) | RESPIRATORY_TRACT | Status: DC
Start: 2021-07-28 — End: 2021-07-31
  Administered 2021-07-28 – 2021-07-31 (×6): 0.25 mg via RESPIRATORY_TRACT
  Filled 2021-07-28 (×6): qty 2

## 2021-07-28 MED ORDER — IPRATROPIUM-ALBUTEROL 0.5-2.5 (3) MG/3ML IN SOLN: 3.0000 mL | Freq: Four times a day (QID) | RESPIRATORY_TRACT | Status: DC | PRN

## 2021-07-28 MED ORDER — SODIUM CHLORIDE 0.9% FLUSH: 3.0000 mL | INTRAVENOUS | Status: DC | PRN

## 2021-07-28 MED ORDER — LEFLUNOMIDE 20 MG PO TABS
20.0000 mg | ORAL_TABLET | Freq: Every day | ORAL | Status: DC
  Administered 2021-07-29 – 2021-07-31 (×3): 20 mg via ORAL
  Filled 2021-07-28 (×3): qty 1

## 2021-07-28 MED ORDER — DICYCLOMINE HCL 10 MG PO CAPS
20.0000 mg | ORAL_CAPSULE | Freq: Two times a day (BID) | ORAL | Status: DC | PRN
Start: 2021-07-28 — End: 2021-07-31

## 2021-07-28 NOTE — ED Provider Notes (Signed)
[facility] Provider Note    Event Date/Time   First MD Initiated Contact with Patient 07/28/21 1304     (approximate)   History   Chief complaint is shortness of breath   HPI  Kerri Carter is a 81 y.o. female who reports she was recently discharged from this hospital for pneumonia and COPD.  She went home she is still very short of breath she has gotten more short of breath especially last night she had a lot of trouble breathing.  She complains of a productive cough.  Green phlegm is coming up.  EMS was called today and had to put her on CPAP for hypoxia and shortness of breath.  They gave her 2 DuoNeb's and some of the wheezing she had improved.    Past medical history: Principal Problem:   Community acquired pneumonia Active Problems:   OSA (obstructive sleep apnea)   SOB (shortness of breath)   Rheumatoid arthritis (HCC)   Hyperlipidemia   Anemia   Essential hypertension   Mixed bipolar I disorder (HCC)   Leukocytosis   PAD (peripheral artery disease) (HCC)   COPD exacerbation (HCC)   Anxiety and depression   Sepsis due to pneumonia (HCC)   Stage 3a chronic kidney disease (CKD) (HCC)   CAP (community acquired pneumonia)  Physical Exam   Triage Vital Signs: ED Triage Vitals  Enc Vitals Group     BP      Pulse      Resp      Temp      Temp src      SpO2      Weight      Height      Head Circumference      Peak Flow      Pain Score      Pain Loc      Pain Edu?      Excl. in GC?     Most recent vital signs: Vitals:   07/28/21 1430 07/28/21 1500  BP: (!) 147/74 (!) 148/79  Pulse: 92 79  Resp: 17 16  Temp:    SpO2: 100% 98%     General: Awake, alert complaining of shortness of breath but able to speak several words at a time. CV:  Good peripheral perfusion.  Heart regular rate and rhythm no murmurs but somewhat tacky Resp:  Slight retractions even on the CPAP.  No wheezes currently but mildly decreased breath  sounds. Abd:  No distention.  Soft and nontender Extremities trace edema   ED Results / Procedures / Treatments   Labs (all labs ordered are listed, but only abnormal results are displayed) Labs Reviewed  BRAIN NATRIURETIC PEPTIDE - Abnormal; Notable for the following components:      Result Value   B Natriuretic Peptide 670.5 (*)    All other components within normal limits  CBC WITH DIFFERENTIAL/PLATELET - Abnormal; Notable for the following components:   WBC 15.1 (*)    RBC 3.86 (*)    Hemoglobin 9.4 (*)    HCT 31.0 (*)    MCH 24.4 (*)    RDW 16.2 (*)    Neutro Abs 11.7 (*)    Abs Immature Granulocytes 0.41 (*)    All other components within normal limits  BLOOD GAS, VENOUS - Abnormal; Notable for the following components:   pH, Ven 7.49 (*)    pCO2, Ven 33 (*)    pO2, Ven 123 (*)    Acid-Base Excess  2.2 (*)    All other components within normal limits  TROPONIN I (HIGH SENSITIVITY) - Abnormal; Notable for the following components:   Troponin I (High Sensitivity) 77 (*)    All other components within normal limits  RESP PANEL BY RT-PCR (FLU A&B, COVID) ARPGX2  SARS CORONAVIRUS 2 BY RT PCR  LACTIC ACID, PLASMA  LACTIC ACID, PLASMA  PROCALCITONIN  TROPONIN I (HIGH SENSITIVITY)     EKG  EKG read interpreted by me shows normal sinus rhythm rate of 99 normal axis no obvious new ST-T changes possible LVH.   RADIOLOGY Chest x-ray read by radiology reviewed by me shows apparent new right upper lobe infiltrate.  PROCEDURES:  Critical Care performed: Critical care time 30 minutes this includes reviewing the patient's old records speaking with the hospitalist and thinking about what we can give her for the apparent right upper lobe pneumonia which is new.  Patient on BiPAP.  I am talking to the hospitalist.  Procedures   MEDICATIONS ORDERED IN ED: Medications  piperacillin-tazobactam (ZOSYN) IVPB 3.375 g (has no administration in time range)  azithromycin  (ZITHROMAX) 500 mg in sodium chloride 0.9 % 250 mL IVPB (has no administration in time range)     IMPRESSION / MDM / ASSESSMENT AND PLAN / ED COURSE  I reviewed the triage vital signs and the nursing notes. Pharmacist Rodriguez-Guzman reviewed the patient's chart and saw that she has had Augmentin without difficulty and feels the patient should be out of tolerate Zosyn.  We will give the patient a small test dose and if she does well with that we will give her the rest of the Zosyn and Zithromax.  Differential diagnosis includes, but is not limited to, no pneumonia aspiration pneumonia is less likely pneumonia appears to be hospital associated since she was a recent hospital discharge.  Patient's presentation is most consistent with acute presentation with potential threat to life or bodily function.  The patient is on the cardiac monitor to evaluate for evidence of arrhythmia and/or significant heart rate changes.  None have been seen. Patient's chest x-ray looks like there is a right upper lobe infiltrate.  Patient additionally has an elevated BNP and chest x-ray could be also consistent with some CHF she has COPD as well.   FINAL CLINICAL IMPRESSION(S) / ED DIAGNOSES   Final diagnoses:  Hypoxia  COPD exacerbation (HCC)  Congestive heart failure, unspecified HF chronicity, unspecified heart failure type (HCC)  Shortness of breath  HCAP (healthcare-associated pneumonia)     Rx / DC Orders   ED Discharge Orders     None        Note:  This document was prepared using Dragon voice recognition software and may include unintentional dictation errors.   Arnaldo Natal, MD 07/28/21 1520

## 2021-07-28 NOTE — Assessment & Plan Note (Signed)
Last Echo 05/25/21 with EF 50-55%, Grade II DD. Patient now with acute SOB/respiratory failure with hypoxemia, Bibasilar rales on exam and BNP 670.5 c/w acute exacerbation HF  Plan Continue home cardiac medications  Furosemide 40 mg IV q 8 x 3 doses  F/u BNP  O2 at 2L [place]

## 2021-07-28 NOTE — Assessment & Plan Note (Signed)
-   Continue home regimen 

## 2021-07-28 NOTE — Telephone Encounter (Signed)
Transition Care Management Unsuccessful Follow-up Telephone Call  Date of discharge and from where:  07/24/21 [facility]  Attempts:  2nd Attempt  Reason for unsuccessful TCM follow-up call:  Unable to reach patient

## 2021-07-28 NOTE — Assessment & Plan Note (Signed)
Patient reports that when tested she had "mild" OSA. She did use CPAP short term but has found it uncomfortable and states she will not use regularly.  Plan Respiratory therapy for CPAP and proper fit of mask.

## 2021-07-29 ENCOUNTER — Inpatient Hospital Stay: Payer: Medicare PPO

## 2021-07-29 ENCOUNTER — Inpatient Hospital Stay
Admit: 2021-07-29 | Discharge: 2021-07-29 | Disposition: A | Discharge status: Home / Self Care | Payer: Medicare PPO | Attending: Cardiology | Admitting: Cardiology

## 2021-07-29 DIAGNOSIS — I480 Paroxysmal atrial fibrillation: Secondary | ICD-10-CM

## 2021-07-29 DIAGNOSIS — I5043 Acute on chronic combined systolic (congestive) and diastolic (congestive) heart failure: Secondary | ICD-10-CM | POA: Diagnosis not present

## 2021-07-29 DIAGNOSIS — I4891 Unspecified atrial fibrillation: Secondary | ICD-10-CM

## 2021-07-29 LAB — BASIC METABOLIC PANEL
Anion gap: 9 (ref 5–15)
Anion gap: 12 (ref 5–15)
BUN: 25 mg/dL — ABNORMAL HIGH (ref 8–23)
BUN: 28 mg/dL — ABNORMAL HIGH (ref 8–23)
CO2: 27 mmol/L (ref 22–32)
CO2: 27 mmol/L (ref 22–32)
Calcium: 8.1 mg/dL — ABNORMAL LOW (ref 8.9–10.3)
Calcium: 8.3 mg/dL — ABNORMAL LOW (ref 8.9–10.3)
Chloride: 103 mmol/L (ref 98–111)
Chloride: 99 mmol/L (ref 98–111)
Creatinine, Ser: 1.28 mg/dL — ABNORMAL HIGH (ref 0.44–1.00)
Creatinine, Ser: 1.6 mg/dL — ABNORMAL HIGH (ref 0.44–1.00)
GFR, Estimated: 43 mL/min — ABNORMAL LOW (ref >60)
GFR, Estimated: 33 mL/min — ABNORMAL LOW (ref >60)
Glucose, Bld: 90 mg/dL (ref 70–99)
Glucose, Bld: 206 mg/dL — ABNORMAL HIGH (ref 70–99)
Potassium: 3.5 mmol/L (ref 3.5–5.1)
Potassium: 3.8 mmol/L (ref 3.5–5.1)
Sodium: 139 mmol/L (ref 135–145)
Sodium: 138 mmol/L (ref 135–145)

## 2021-07-29 LAB — D-DIMER, QUANTITATIVE: D-Dimer, Quant: 1.38 ug/mL-FEU — ABNORMAL HIGH (ref 0.00–0.50)

## 2021-07-29 LAB — BRAIN NATRIURETIC PEPTIDE: B Natriuretic Peptide: 503.4 pg/mL — ABNORMAL HIGH (ref 0.0–100.0)

## 2021-07-29 MED ORDER — APIXABAN 5 MG PO TABS
5.0000 mg | ORAL_TABLET | Freq: Two times a day (BID) | ORAL | Status: DC
  Administered 2021-07-29 – 2021-07-31 (×4): 5 mg via ORAL
  Filled 2021-07-29 (×4): qty 1

## 2021-07-29 MED ORDER — ETODOLAC 200 MG PO CAPS
400.0000 mg | ORAL_CAPSULE | Freq: Every day | ORAL | Status: DC
  Administered 2021-07-29 – 2021-07-31 (×3): 400 mg via ORAL
  Filled 2021-07-29 (×3): qty 2

## 2021-07-29 NOTE — Consult Note (Signed)
[place] NOTE       Patient ID: Kerri Carter MRN: [id number] DOB/AGE: 79-15-1942 79 y.o.  Admit date: 07/28/2021 Referring Physician Dr. Laurey Arrow Primary Physician Dr. Olivia Mackie McLean-Scocuzza Primary Cardiologist Lattie Haw Povsic, PA-C Reason for Consultation dyspnea on exertion, severe MR  HPI: Kerri Carter is a 81 year old female with a PMH of HFpEF (LVEF 50-55%, g2DD 05/26/21), severe MR/mild MS, mi-mod AS, COPD on 2L, nonobstructive CAD by heart cath 03/2011, hypertension, hyperlipidemia, PAD, CKD3 with history of R nephrectomy 1988, conversion disorder, anxiety, L3-L4 disc herniation s/p lumbar fusion extension 05/23/21 with 10 day hospitalization complicated by respiratory failure 2/2 pneumonia and new onset paroxysmal atrial fibrillation who presented to [facility] ED on 07/28/2021 with shortness of breath. Cardiology is consulted for further assistance.   She presents today with her grandson who contributes to the history.  She says about 1 week ago she started becoming more short of breath, dyspneic while walking between rooms in her home with a walker, and chart review says she lives with her 59-year-old great granddaughter that had some respiratory issues recently.  The patient was hospitalized for 2 days between 6/20 - 6/22 with a left lower lobe pneumonia, treated with Rocephin and azithromycin, discharged with Augmentin and prednisone 40 mg daily and was ambulatory at discharge.  She says that after discharge see still felt short of breath with minimal exertion and thought she still needed to be in the hospital.  She does have COPD on 2 L at night, however she has been wearing her oxygen all the time now for the past week.  She does admit to bilateral lower extremity edema, and swelling in her upper arms and face per her grandson.  Admits to orthopnea, denies chest pain or palpitations, no cough, wheezing, fevers..  She was discharged in April on Lasix 20 mg once a day which she  reports compliance.  She does have a history of paroxysmal atrial fibrillation that was new in onset during her hospitalization in April, and she was rate controlled on her home Bystolic with plans to start systemic anticoagulation with Eliquis after 2 weeks postop from her lumbar fusion as recommended by neurosurgery, but she was unable to get a follow-up appointment with her regular cardiologist, thus never ultimately started [facility].  Labs on admission are notable for a creatinine of 0.99, GFR 58, uptrending to 1.28/43 after 40 mg of IV Lasix x2.  BNP elevated at 670, downtrending to 503 today.  High-sensitivity troponin minimally elevated and flat trending at 77-69.  Procalcitonin less than 0.10.  WBCs elevated at 16-15, however she was on prednisone.  Vitals are notable for recent blood pressure of 123/59 during interview, heart rate 91 in a looks like sinus rhythm with frequent premature atrial contractions on telemetry.  Repeat twelve-lead EKG shows sinus rhythm with frequent PACs.  Review of systems complete and found to be negative unless listed above     Past Medical History:  Diagnosis Date   Anxiety    Aortic stenosis 07/09/2015   a.) TTE 07/06/2015: EF 55-60%; mild AS with MPG 13.0 mmHg.   Asthma    Bipolar disorder ([place])    C. difficile diarrhea 2010   a.) following ABX course during hospital admission   Carotid atherosclerosis, bilateral    a.) Moderate; < 50% stenosis BILATERAL ICAs.   Cataract    a.) s/p BILATERAL extraction   Chicken pox    CKD (chronic kidney disease), stage III ([place])  a. s/p R nephrectomy./ aneurysm   Conversion disorder    COPD (chronic obstructive pulmonary disease) (HCC)    Depression    Essential hypertension    GERD (gastroesophageal reflux disease)    Heart murmur    Hyperlipidemia    ILD (interstitial lung disease) (HCC)    mild; 2/2 RA diagnosis   Inflammatory arthritis    a. hands/carpal tunnel.  b. Low titer rheumatoid factor. c.  Negative anti-CCP antibodies. d. Plaquenil.   Macular degeneration    Nocturnal hypoxemia    Non-Obstructive CAD    a. 07/2009 Cath ([facility]): nonobs dzs;  b. 03/2011 Cath [facility]): nonobs dzs.   Osteoarthritis    a. Knees.   PAD (peripheral artery disease) (HCC)    PUD (peptic ulcer disease)    S/P right hip fracture    11/01/16 s/p repair   Shoulder pain    Sleep apnea    no cpap / minimal   Spinal stenosis at L4-L5 level    severe with L4/L5 anterolisthesis grade 1 anterolisthesis    Toxic maculopathy    Valvular heart disease    a.) TTE 07/06/2015: EF 55-60%; Mild MR/AR/TR; Mild AS with MPG 13.0 mmHg.    Past Surgical History:  Procedure Laterality Date   APPENDECTOMY     APPLICATION OF INTRAOPERATIVE CT SCAN N/A 05/23/2021   Procedure: APPLICATION OF INTRAOPERATIVE CT SCAN;  Surgeon: Meade Maw, MD;  Location: [facility] ORS;  Service: Neurosurgery;  Laterality: N/A;   BACK SURGERY     lumbar fusion   BUNIONECTOMY Right    CATARACT EXTRACTION, BILATERAL     CESAREAN SECTION     x1   CHOLECYSTECTOMY N/A 05/11/2016   Procedure: LAPAROSCOPIC CHOLECYSTECTOMY;  Surgeon: Florene Glen, MD;  Location: [facility] ORS;  Service: General;  Laterality: N/A;   COLONOSCOPY WITH PROPOFOL N/A 04/02/2016   Procedure: COLONOSCOPY WITH PROPOFOL;  Surgeon: Jonathon Bellows, MD;  Location: [facility] ENDOSCOPY;  Service: Endoscopy;  Laterality: N/A;   ENDOSCOPIC RETROGRADE CHOLANGIOPANCREATOGRAPHY (ERCP) WITH PROPOFOL N/A 05/08/2016   Procedure: ENDOSCOPIC RETROGRADE CHOLANGIOPANCREATOGRAPHY (ERCP) WITH PROPOFOL;  Surgeon: Lucilla Lame, MD;  Location: [facility] ENDOSCOPY;  Service: Endoscopy;  Laterality: N/A;   ERCP     with biliary spincterotomy 05/08/16 Dr. Allen Norris for choledocholithiasis    ESOPHAGEAL DILATION  04/02/2016   Procedure: ESOPHAGEAL DILATION;  Surgeon: Jonathon Bellows, MD;  Location: [facility] ENDOSCOPY;  Service: Endoscopy;;   ESOPHAGOGASTRODUODENOSCOPY (EGD) WITH PROPOFOL N/A 04/02/2016   Procedure:  ESOPHAGOGASTRODUODENOSCOPY (EGD) WITH PROPOFOL;  Surgeon: Jonathon Bellows, MD;  Location: [facility] ENDOS[facility];  Service: Endoscopy;  Laterality: N/A;   HIP ARTHROPLASTY Right 11/01/2016   Procedure: ARTHROPLASTY BIPOLAR HIP (HEMIARTHROPLASTY);  Surgeon: Corky Mull, MD;  Location: [facility] ORS;  Service: Orthopedics;  Laterality: Right;   LUMBAR LAMINECTOMY/DECOMPRESSION MICRODISCECTOMY N/A 12/11/2020   Procedure: LEFT L2-3 MICRODISCECTOMY, L3-4 AND L5-S1 DECOMPRESSION;  Surgeon: Meade Maw, MD;  Location: [facility] ORS;  Service: Neurosurgery;  Laterality: N/A;   NEPHRECTOMY  1988   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   ORIF SCAPHOID FRACTURE Left 12/21/2019   Procedure: OPEN REDUCTION INTERNAL FIXATION (ORIF) OF LEFT SCAPULAR NONUNION WITH BONE GRAFT;  Surgeon: Corky Mull, MD;  Location: [facility] ORS;  Service: Orthopedics;  Laterality: Left;   osteoporosis     noted DEXA 08/19/16    REPLACEMENT TOTAL KNEE Right    REVERSE SHOULDER ARTHROPLASTY Right 11/04/2017   Procedure: REVERSE SHOULDER ARTHROPLASTY;  Surgeon: Corky Mull, MD;  Location: [facility] ORS;  Service: Orthopedics;  Laterality: Right;   REVERSE SHOULDER ARTHROPLASTY Left 07/26/2018   Procedure: REVERSE SHOULDER ARTHROPLASTY;  Surgeon: Corky Mull, MD;  Location: [facility] OR[facility];  Service: Orthopedics;  Laterality: Left;   TONSILLECTOMY     TOTAL HIP ARTHROPLASTY  12/10/11   [facility] left hip   TOTAL HIP ARTHROPLASTY Bilateral    TRANSFORAMINAL LUMBAR INTERBODY FUSION (TLIF) WITH PEDICLE SCREW FIXATION 3 LEVEL N/A 05/23/2021   Procedure: OPEN L2-4 TRANSFORAMINAL LUMBAR INTERBODY FUSION (TLIF) WITH L2-5 POSTERIOR SPINAL FUSION;  Surgeon: Meade Maw, MD;  Location: [facility] ORS;  Service: Neurosurgery;  Laterality: N/A;   TUBAL LIGATION      (Not in a hospital admission)  Social History   Socioeconomic History   Marital status: Widowed    Spouse name: richard   Number of children: 2   Years of education: Some Coll    Highest education level: Some college, no degree  Occupational History   Occupation: Retired    Comment: retired  Tobacco Use   Smoking status: Former    Packs/day: 0.50    Years: 20.00    Total pack years: 10.00    Types: Cigarettes    Quit date: 02/02/1974    Years since quitting: 47.5   Smokeless tobacco: Never  Vaping Use   Vaping Use: Never used  Substance and Sexual Activity   Alcohol use: No   Drug use: No   Sexual activity: Not Currently  Other Topics Concern   Not on file  Social History Narrative   Lives at home in [place] grandson, his wife and their child.   Husband Brodie Scovell died covid 44 late 1/2061mrried x 520years   Right-handed.   6 cups coffee per day.   Social Determinants of Health   Financial Resource Strain: Low Risk  (06/18/2021)   Overall Financial Resource Strain (CARDIA)    Difficulty of Paying Living Expenses: Not hard at all  Food Insecurity: No Food Insecurity (06/18/2021)   Hunger Vital Sign    Worried About Running Out of Food in the Last Year: Never true    Ran Out of Food in the Last Year: Never true  Transportation Needs: Unmet Transportation Needs (06/26/2021)   PRAPARE - Transportation    Lack of Transportation (Medical): Yes    Lack of Transportation (Non-Medical): Yes  Physical Activity: Inactive (11/24/2019)   Exercise Vital Sign    Days of Exercise per Week: 0 days    Minutes of Exercise per Session: 0 min  Stress: No Stress Concern Present (06/18/2021)   [place]   Feeling of Stress : Not at all  Social Connections: Moderately Integrated (06/18/2021)   Social Connection and Isolation Panel [NHANES]    Frequency of Communication with Friends and Family: More than three times a week    Frequency of Social Gatherings with Friends and Family: Never    Attends Religious Services: More than 4 times per year    Active Member of [employer] Organizations: No     Attends CArchivistMeetings: Never    Marital Status: Married  IHuman resources officerViolence: Not At Risk (06/18/2021)   Humiliation, Afraid, Rape, and Kick questionnaire    Fear of Current or Ex-Partner: No    Emotionally Abused: No    Physically Abused: No    Sexually Abused: No    Family History  Problem Relation Age of Onset   Rheum arthritis Mother    Asthma  Mother    Parkinson's disease Mother    Heart disease Mother    Stroke Mother    Hypertension Mother    Heart attack Father    Heart disease Father    Hypertension Father    Peripheral Artery Disease Father    Diabetes Son    Gout Son    Asthma Sister    Heart disease Sister    Lung cancer Sister    Heart disease Sister    Heart disease Sister    Breast cancer Sister    Heart attack Sister    Heart disease Brother    Heart disease Maternal Grandmother    Diabetes Maternal Grandmother    Colon cancer Maternal Grandmother    Cancer Maternal Grandmother        Hodgkins lymphoma   Heart disease Brother    Alcohol abuse Brother    Depression Brother    Dementia Son       PHYSICAL EXAM General: Elderly Caucasian female, well nourished, in no acute distress.  Sitting upright in hospital bed with grandson at bedside HEENT:  Normocephalic and atraumatic. Neck:  No JVD.  Lungs: Normal respiratory effort on 3 L by nasal cannula, able to converse at length without dyspnea.  Clear bilaterally with decreased air entry without appreciable crackles or wheezes. Heart: HRRR with frequent premature beats. Normal S1 and S2, 2/6 systolic murmur best heard at RUSB Abdomen: Soft, nontender, nondistended Msk: Normal strength and tone for age. Extremities: Warm and well perfused. No clubbing, cyanosis.  No peripheral edema.  Neuro: Alert and oriented X 3. Psych:  Answers questions appropriately.   Labs:   Lab Results  Component Value Date   WBC 15.1 (H) 07/28/2021   HGB 9.4 (L) 07/28/2021   HCT 31.0 (L) 07/28/2021    MCV 80.3 07/28/2021   PLT 379 07/28/2021    Recent Labs  Lab 07/22/21 1849 07/23/21 0304 07/29/21 0550  NA 142   < > 139  K 4.7   < > 3.5  CL 109   < > 103  CO2 24   < > 27  BUN 14   < > 25*  CREATININE 1.01*   < > 1.28*  CALCIUM 8.4*   < > 8.1*  PROT 6.6  --   --   BILITOT 0.7  --   --   ALKPHOS 110  --   --   ALT 13  --   --   AST 27  --   --   GLUCOSE 117*   < > 90   < > = values in this interval not displayed.   Lab Results  Component Value Date   CKTOTAL 58 11/24/2013   CKMB 0.7 11/24/2013   TROPONINI <0.03 05/07/2016    Lab Results  Component Value Date   CHOL 176 12/01/2019   CHOL 147 12/01/2018   CHOL 144 01/21/2018   Lab Results  Component Value Date   HDL 53.60 12/01/2019   HDL 42 12/01/2018   HDL 40.50 01/21/2018   Lab Results  Component Value Date   LDLCALC 60 12/01/2018   LDLCALC 86 08/06/2017   LDLCALC 43 06/08/2016   Lab Results  Component Value Date   TRIG 360.0 (H) 12/01/2019   TRIG 227 (H) 12/01/2018   TRIG 255.0 (H) 01/21/2018   Lab Results  Component Value Date   CHOLHDL 3 12/01/2019   CHOLHDL 3.5 12/01/2018   CHOLHDL 4 01/21/2018   Lab Results  Component  Value Date   LDLDIRECT 60.0 12/01/2019   LDLDIRECT 60.0 01/21/2018      Radiology: DG Chest Portable 1 View  Result Date: 07/28/2021 CLINICAL DATA:  Shortness of breath.  History of COPD. EXAM: PORTABLE CHEST 1 VIEW COMPARISON:  Radiographs 07/22/2021 and 05/29/2021.  CT 07/22/2021. FINDINGS: 1322 hours. Lordotic positioning. The heart size and mediastinal contours are stable. Interval increased ill-defined density in the right upper lobe which could reflect an early infiltrate. The left lung appears clear. There is no evidence of pleural effusion or pneumothorax. Patient is status post bilateral shoulder reverse arthroplasty, and there are several old right-sided rib fractures. No acute osseous findings are evident. Telemetry leads overlie the chest. IMPRESSION: Possible new  ill-defined infiltrate or atelectasis in the right upper lobe. Electronically Signed   By: Richardean Sale M.D.   On: 07/28/2021 13:36   CT Chest Wo Contrast  Result Date: 07/22/2021 CLINICAL DATA:  Respiratory illness cough with leg swelling EXAM: CT CHEST WITHOUT CONTRAST TECHNIQUE: Multidetector CT imaging of the chest was performed following the standard protocol without IV contrast. RADIATION DOSE REDUCTION: This exam was performed according to the departmental dose-optimization program which includes automated exposure control, adjustment of the mA and/or kV according to patient size and/or use of iterative reconstruction technique. COMPARISON:  Chest x-ray 07/22/2021, chest CT 03/21/2011, 05/01/2020 FINDINGS: Cardiovascular: Limited evaluation without intravenous contrast. Moderate aortic atherosclerosis. No aneurysm. Coronary vascular calcifications. No pericardial effusion. Mitral calcification. Mediastinum/Nodes: Midline trachea. No thyroid mass. No suspicious lymph nodes. Esophagus within normal limits. Lungs/Pleura: No consolidation, pleural effusion or pneumothorax. Mild lower lobe bronchial wall thickening with scattered mucous plugging. Mild tree-in-bud nodularity in the left lower lobe, series 3 image 79. Upper Abdomen: No acute abnormality. Musculoskeletal: No acute osseous abnormality. Previously noted nodular opacity may correspond to lead attachment on the left upper anterior chest wall. IMPRESSION: 1. No suspicious pulmonary nodule to correspond to the questioned radiographic abnormality. 2. Mild lower lobe bronchial wall thickening with scattered mucous plugging. Small amount of tree-in-bud nodularity in the left lower lobe suggesting respiratory infection. Aortic Atherosclerosis (ICD10-I70.0). Electronically Signed   By: Donavan Foil M.D.   On: 07/22/2021 19:50   DG Chest 2 View  Result Date: 07/22/2021 CLINICAL DATA:  Cough and swelling in the legs EXAM: CHEST - 2 VIEW COMPARISON:   Chest radiograph 05/29/2021 FINDINGS: The cardiomediastinal silhouette is normal. The lungs hyperinflated with flattening of the diaphragms consistent with underlying COPD. There is no focal consolidation or pulmonary edema. Previously seen opacities in the left lung have significantly improved/resolved. There is no pleural effusion or pneumothorax. There is a 1.3 cm nodular opacity in the left apex not seen on prior studies. Bilateral shoulder arthroplasty hardware, left clavicle surgical fixation, and lumbar spine fusion hardware are noted. Opacities projecting over the right fifth and sixth ribs are consistent with reflect old fractures. IMPRESSION: 1. No radiographic evidence of acute cardiopulmonary process. Previously seen opacities in the left lung on the study from 05/29/2021 have resolved. 2. 1.3 cm nodular opacity in the left apex not seen on prior studies. Recommend nonemergent chest CT to evaluate for a pulmonary nodule. Electronically Signed   By: Valetta Mole M.D.   On: 07/22/2021 18:06   CT HEAD WO CONTRAST (5MM)  Result Date: 07/17/2021 CLINICAL DATA:  Fall EXAM: CT HEAD WITHOUT CONTRAST TECHNIQUE: Contiguous axial images were obtained from the base of the skull through the vertex without intravenous contrast. RADIATION DOSE REDUCTION: This exam was performed according to  the departmental dose-optimization program which includes automated exposure control, adjustment of the mA and/or kV according to patient size and/or use of iterative reconstruction technique. COMPARISON:  02/10/2021 FINDINGS: Brain: No evidence of acute infarction, hemorrhage, cerebral edema, mass, mass effect, or midline shift. No extra-axial fluid collection. Unchanged size and configuration of the ventricles, favored to be related to cerebral volume loss, although normal pressure hydrocephalus can appear similar. Periventricular white matter changes, likely the sequela of chronic small vessel ischemic disease. Vascular: No  hyperdense vessel. Atherosclerotic calcifications in the intracranial carotid and vertebral arteries. Skull: Normal. Negative for fracture or focal lesion. Sinuses/Orbits: Mucosal thickening in the left-greater-than-right ethmoid air cells. Status post bilateral lens replacements. Other: The mastoid air cells are well aerated. IMPRESSION: No acute intracranial process. Electronically Signed   By: Merilyn Baba M.D.   On: 07/17/2021 14:55   DG Humerus Right  Result Date: 07/17/2021 CLINICAL DATA:  Right arm pain after fall. EXAM: RIGHT HUMERUS - 2+ VIEW COMPARISON:  None Available. FINDINGS: Status post right shoulder arthroplasty. Old right rib fractures are noted. No acute fracture or dislocation is noted. IMPRESSION: No acute abnormality seen. Electronically Signed   By: Marijo Conception M.D.   On: 07/17/2021 14:20   DG Shoulder Right  Result Date: 07/17/2021 CLINICAL DATA:  Right arm pain after fall. EXAM: RIGHT SHOULDER - 2+ VIEW COMPARISON:  None Available. FINDINGS: Status post right total shoulder arthroplasty. Glenoid and humeral components are well situated. Old right rib fractures are noted. No acute fracture or dislocation is noted. IMPRESSION: No acute abnormality is noted. Electronically Signed   By: Marijo Conception M.D.   On: 07/17/2021 14:19    ECHO 05/26/2021  1. Left ventricular ejection fraction, by estimation, is 50 to 55%. The  left ventricle has low normal function. Left ventricular endocardial  border not optimally defined to evaluate regional wall motion. Left  ventricular diastolic parameters are  consistent with Grade II diastolic dysfunction (pseudonormalization).   2. Right ventricular systolic function is normal. The right ventricular  size is normal. Tricuspid regurgitation signal is inadequate for assessing  PA pressure.   3. Left atrial size was mildly dilated.   4. The mitral valve is normal in structure. Severe mitral valve  regurgitation. Mild mitral stenosis.  The mean mitral valve gradient is 7.0  mmHg. Moderate mitral annular calcification.   5. The aortic valve was not well visualized. Aortic valve regurgitation  is moderate. Mild to moderate aortic valve stenosis. Aortic valve mean  gradient measures 18.0 mmHg.   6. The inferior vena cava is normal in size with greater than 50%  respiratory variability, suggesting right atrial pressure of 3 mmHg.   7. Challenging image quality. Consider a TEE to evaluate mitral valve.   EKG reviewed by me: on admission, sinus rhythm LVH rate 99   ASSESSMENT AND PLAN:  Kerri Carter is a 81 year old female with a PMH of HFpEF (LVEF 50-55%, g2DD 05/26/21), severe MR/mild MS, mi-mod AS, COPD on 2L, nonobstructive CAD by heart cath 03/2011, hypertension, hyperlipidemia, PAD, CKD3 with history of R nephrectomy 1988, conversion disorder, anxiety, L3-L4 disc herniation s/p lumbar fusion extension 05/23/21 with 10 day hospitalization complicated by respiratory failure 2/2 pneumonia and new onset paroxysmal atrial fibrillation who presented to [facility] ED on 07/28/2021 with shortness of breath. Cardiology is consulted for further assistance.   #acute on chronic respiratory failure 2/2 pneumonia #acute on chronic HFpEF (LVEF 50-55%, g2dd) #severe MR, mild - moderate AS She presents  with worsening dyspnea on exertion following a 2-day hospitalization (discharged 6/22) after being treated for left lower lobe pneumonia.  She reports worsening lower extremity edema, orthopnea, and in and out of atrial fibrillation versus sinus rhythm with frequent PACs since presentation.  She does have known severe MR and mild to moderate aortic stenosis in addition to diastolic heart failure  - complicating her decompensation due to the respiratory illness.  Per the patient and her grandson she reportedly has clinically improved from a volume overload standpoint after 40 mg of IV Lasix x2 overnight.  BNP was elevated to 670 on admission.  She is  subjectively short of breath and also on 3 L on top of her recent baseline of only 2 L at night., but able to carry on lengthy conversations with me without increased work of breathing -Agree with current therapy per primary team for pneumonia. -S/p 40 mg of IV Lasix x2 with bump in creatinine from 0.99 to 1.28.  -Hold further doses of Lasix today, consider redosing tomorrow morning after recheck of BMP (home dose of Lasix was 20 mg daily, consider discharge at 40 mg) -Continue GDMT with home Bystolic 5 mg twice daily, not on ACEi/ARB due to anaphylaxis with lisinopril, can consider addition of spironolactone pending her renal function -Repeat echocardiogram complete to assess her valvular disease -Will need close follow-up with her regular cardiologist at [facility] at discharge for consideration of further therapies/possible repair for her mitral valve  #Elevated troponin Minimally elevated and flat trending at 77-69, in the absence of chest pain this likely represents demand ischemia and not ACS.  #paroxysmal atrial fibrillation -Discovered during hospitalization in April 2023 for elective lumbar fusion, neurosurgery recommended starting anticoagulation after at least 2 weeks postop, but unfortunately she never followed up with cardiology and was thus never started on [facility]. -She is currently in sinus rhythm with frequent PACs, on telemetry has some paroxysms of possibly A-fib -Her CHA2DS2-VASc is 4, so systemic AC is recommended for stroke risk reduction.  Discussed risks and benefits with the patient and she is agreeable to start Eliquis 5 mg twice daily. -she has been on plavix '75mg'$  daily after a TIA ~ 1 year ago, will d/c this in favor of eliquis to reduce bleeding risk   This patient's plan of care was discussed and created with Dr. Saralyn Pilar and he is in agreement.  Signed: Tristan Schroeder , PA-C 07/29/2021, 8:53 AM [facility]

## 2021-07-29 NOTE — Telephone Encounter (Signed)
Transition Care Management Unsuccessful Follow-up Telephone Call  Date of discharge and from where:  07/24/21 [facility]  Attempts:  3rd Attempt  Reason for unsuccessful TCM follow-up call:  Unable to reach patient. Patient re-admitted to hospital. This closes [facility] attempts for hfu.

## 2021-07-30 ENCOUNTER — Telehealth: Payer: Self-pay | Admitting: Internal Medicine

## 2021-07-30 ENCOUNTER — Inpatient Hospital Stay: Payer: Medicare PPO | Admitting: Pulmonary Disease

## 2021-07-30 DIAGNOSIS — I5043 Acute on chronic combined systolic (congestive) and diastolic (congestive) heart failure: Secondary | ICD-10-CM | POA: Diagnosis not present

## 2021-07-30 LAB — ECHOCARDIOGRAM COMPLETE
AR max vel: 1.23 cm2
AV Area VTI: 1.37 cm2
AV Area mean vel: 1.28 cm2
AV Mean grad: 17 mmHg
AV Peak grad: 29.4 mmHg
Ao pk vel: 2.71 m/s
Area-P 1/2: 2.93 cm2
Height: 61.5 in
MV VTI: 1.55 cm2
S' Lateral: 3.1 cm
Weight: 2769.68 oz

## 2021-07-30 LAB — BASIC METABOLIC PANEL
Anion gap: 11 (ref 5–15)
BUN: 25 mg/dL — ABNORMAL HIGH (ref 8–23)
CO2: 28 mmol/L (ref 22–32)
Calcium: 8.5 mg/dL — ABNORMAL LOW (ref 8.9–10.3)
Chloride: 102 mmol/L (ref 98–111)
Creatinine, Ser: 1.26 mg/dL — ABNORMAL HIGH (ref 0.44–1.00)
GFR, Estimated: 44 mL/min — ABNORMAL LOW (ref >60)
Glucose, Bld: 94 mg/dL (ref 70–99)
Potassium: 3.6 mmol/L (ref 3.5–5.1)
Sodium: 141 mmol/L (ref 135–145)

## 2021-07-30 MED ORDER — STERILE WATER FOR INJECTION IJ SOLN
INTRAMUSCULAR | Status: AC
  Filled 2021-07-30: qty 10

## 2021-07-30 MED ORDER — IPRATROPIUM-ALBUTEROL 0.5-2.5 (3) MG/3ML IN SOLN
3.0000 mL | Freq: Three times a day (TID) | RESPIRATORY_TRACT | Status: DC
  Administered 2021-07-30 – 2021-07-31 (×3): 3 mL via RESPIRATORY_TRACT
  Filled 2021-07-30 (×3): qty 3

## 2021-07-30 MED ORDER — PREDNISONE 10 MG PO TABS
10.0000 mg | ORAL_TABLET | Freq: Every day | ORAL | Status: AC
  Administered 2021-07-31: 10 mg via ORAL
  Filled 2021-07-30: qty 1

## 2021-07-30 MED ORDER — TORSEMIDE 20 MG PO TABS
20.0000 mg | ORAL_TABLET | Freq: Every day | ORAL | Status: DC
Start: 2021-07-30 — End: 2021-07-31
  Administered 2021-07-30 – 2021-07-31 (×2): 20 mg via ORAL
  Filled 2021-07-30 (×2): qty 1

## 2021-07-30 MED ORDER — POTASSIUM CHLORIDE CRYS ER 20 MEQ PO TBCR
40.0000 meq | EXTENDED_RELEASE_TABLET | Freq: Once | ORAL | Status: AC
  Administered 2021-07-30: 40 meq via ORAL
  Filled 2021-07-30: qty 2

## 2021-07-30 NOTE — Telephone Encounter (Signed)
Pt son called in stating pt need a referral to cardiologist. Pt son would like to be called [phone]

## 2021-07-30 NOTE — Progress Notes (Signed)
[name]       Patient ID: Kerri Carter MRN: [id number] DOB/AGE: 02/03/1940 81 y.o.  Admit date: 07/28/2021 Referring Physician Dr. Laurey Arrow Primary Physician Dr. Olivia Mackie McLean-Scocuzza Primary Cardiologist Lattie Haw Povsic, PA-C Reason for Consultation dyspnea on exertion, severe MR  HPI: Kerri Carter is a 81 year old female with a PMH of HFpEF (LVEF 55-60%, g1DD 07/29/21), mod-severe MR, mi-mod AS, COPD, OSA on 2L at night, nonobstructive CAD by heart cath 03/2011, hypertension, hyperlipidemia, PAD, CKD3 with history of R nephrectomy 1988, conversion disorder, anxiety, L3-L4 disc herniation s/p lumbar fusion extension 05/23/21 with 10 day hospitalization complicated by respiratory failure 2/2 pneumonia and new onset paroxysmal atrial fibrillation who presented to [facility] ED on 07/28/2021 with shortness of breath. Cardiology is consulted for further assistance.   Interval History: - no acute events  - seen this morning eating breakfast without supplemental O2 on, able to speak in full sentences without dyspnea. Re-evaluated this afternoon and had some SOB while ambulating with PT while on 2L - renal function labile, but still worse than on admission even after holding diuresis yesterday - echo resulted with preserved EF, g1 diastolic dysfunction, mod-severe MR, mild-moderate AS -- overall similar to prior from 05/2021  Review of systems complete and found to be negative unless listed above     Past Medical History:  Diagnosis Date   Anxiety    Aortic stenosis 07/09/2015   a.) TTE 07/06/2015: EF 55-60%; mild AS with MPG 13.0 mmHg.   Asthma    Bipolar disorder ([place])    C. difficile diarrhea 2010   a.) following ABX course during hospital admission   Carotid atherosclerosis, bilateral    a.) Moderate; < 50% stenosis BILATERAL ICAs.   Cataract    a.) s/p BILATERAL extraction   Chicken pox    CKD (chronic kidney disease), stage III (HCC)    a. s/p R nephrectomy./  aneurysm   Conversion disorder    COPD (chronic obstructive pulmonary disease) (HCC)    Depression    Essential hypertension    GERD (gastroesophageal reflux disease)    Heart murmur    Hyperlipidemia    ILD (interstitial lung disease) (HCC)    mild; 2/2 RA diagnosis   Inflammatory arthritis    a. hands/carpal tunnel.  b. Low titer rheumatoid factor. c. Negative anti-CCP antibodies. d. Plaquenil.   Macular degeneration    Nocturnal hypoxemia    Non-Obstructive CAD    a. 07/2009 Cath ([facility]): nonobs dzs;  b. 03/2011 Cath [facility]): nonobs dzs.   Osteoarthritis    a. Knees.   PAD (peripheral artery disease) (HCC)    PUD (peptic ulcer disease)    S/P right hip fracture    11/01/16 s/p repair   Shoulder pain    Sleep apnea    no cpap / minimal   Spinal stenosis at L4-L5 level    severe with L4/L5 anterolisthesis grade 1 anterolisthesis    Toxic maculopathy    Valvular heart disease    a.) TTE 07/06/2015: EF 55-60%; Mild MR/AR/TR; Mild AS with MPG 13.0 mmHg.    Past Surgical History:  Procedure Laterality Date   APPENDECTOMY     APPLICATION OF INTRAOPERATIVE CT SCAN N/A 05/23/2021   Procedure: APPLICATION OF INTRAOPERATIVE CT SCAN;  Surgeon: Meade Maw, MD;  Location: [facility] ORS;  Service: Neurosurgery;  Laterality: N/A;   BACK SURGERY     lumbar fusion   BUNIONECTOMY Right    CATARACT EXTRACTION, BILATERAL  CESAREAN SECTION     x1   CHOLECYSTECTOMY N/A 05/11/2016   Procedure: LAPAROSCOPIC CHOLECYSTECTOMY;  Surgeon: Florene Glen, MD;  Location: [facility] ORS;  Service: General;  Laterality: N/A;   COLONOSCOPY WITH PROPOFOL N/A 04/02/2016   Procedure: COLONOSCOPY WITH PROPOFOL;  Surgeon: Jonathon Bellows, MD;  Location: [facility] ENDOSCOPY;  Service: Endoscopy;  Laterality: N/A;   ENDOSCOPIC RETROGRADE CHOLANGIOPANCREATOGRAPHY (ERCP) WITH PROPOFOL N/A 05/08/2016   Procedure: ENDOSCOPIC RETROGRADE CHOLANGIOPANCREATOGRAPHY (ERCP) WITH PROPOFOL;  Surgeon: Lucilla Lame, MD;  Location: [facility]  ENDOSCOPY;  Service: Endoscopy;  Laterality: N/A;   ERCP     with biliary spincterotomy 05/08/16 Dr. Allen Norris for choledocholithiasis    ESOPHAGEAL DILATION  04/02/2016   Procedure: ESOPHAGEAL DILATION;  Surgeon: Jonathon Bellows, MD;  Location: [facility] ENDOSCOPY;  Service: Endoscopy;;   ESOPHAGOGASTRODUODENOSCOPY (EGD) WITH PROPOFOL N/A 04/02/2016   Procedure: ESOPHAGOGASTRODUODENOSCOPY (EGD) WITH PROPOFOL;  Surgeon: Jonathon Bellows, MD;  Location: [facility] ENDOSCOPY;  Service: Endoscopy;  Laterality: N/A;   HIP ARTHROPLASTY Right 11/01/2016   Procedure: ARTHROPLASTY BIPOLAR HIP (HEMIARTHROPLASTY);  Surgeon: Corky Mull, MD;  Location: [facility] ORS;  Service: Orthopedics;  Laterality: Right;   LUMBAR LAMINECTOMY/DECOMPRESSION MICRODISCECTOMY N/A 12/11/2020   Procedure: LEFT L2-3 MICRODISCECTOMY, L3-4 AND L5-S1 DECOMPRESSION;  Surgeon: Meade Maw, MD;  Location: [facility] ORS;  Service: Neurosurgery;  Laterality: N/A;   NEPHRECTOMY  1988   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   ORIF SCAPHOID FRACTURE Left 12/21/2019   Procedure: OPEN REDUCTION INTERNAL FIXATION (ORIF) OF LEFT SCAPULAR NONUNION WITH BONE GRAFT;  Surgeon: Corky Mull, MD;  Location: [facility] ORS;  Service: Orthopedics;  Laterality: Left;   osteoporosis     noted DEXA 08/19/16    REPLACEMENT TOTAL KNEE Right    REVERSE SHOULDER ARTHROPLASTY Right 11/04/2017   Procedure: REVERSE SHOULDER ARTHROPLASTY;  Surgeon: Corky Mull, MD;  Location: [facility] ORS;  Service: Orthopedics;  Laterality: Right;   REVERSE SHOULDER ARTHROPLASTY Left 07/26/2018   Procedure: REVERSE SHOULDER ARTHROPLASTY;  Surgeon: Corky Mull, MD;  Location: [facility] ORS;  Service: Orthopedics;  Laterality: Left;   TONSILLECTOMY     TOTAL HIP ARTHROPLASTY  12/10/11   [facility] left hip   TOTAL HIP ARTHROPLASTY Bilateral    TRANSFORAMINAL LUMBAR INTERBODY FUSION (TLIF) WITH PEDICLE SCREW FIXATION 3 LEVEL N/A 05/23/2021   Procedure: OPEN L2-4 TRANSFORAMINAL LUMBAR INTERBODY FUSION  (TLIF) WITH L2-5 POSTERIOR SPINAL FUSION;  Surgeon: Meade Maw, MD;  Location: [facility] ORS;  Service: Neurosurgery;  Laterality: N/A;   TUBAL LIGATION      Medications Prior to Admission  Medication Sig Dispense Refill Last Dose   amoxicillin-clavulanate (AUGMENTIN) 875-125 MG tablet Take 1 tablet by mouth 2 (two) times daily. 8 tablet 0 Past Week   budesonide (PULMICORT) 0.25 MG/2ML nebulizer solution USE 1 VIAL  IN  NEBULIZER TWICE  DAILY - rinse mouth after treatment 120 mL 11 Past Week   busPIRone (BUSPAR) 5 MG tablet Take 1 tablet (5 mg total) by mouth 2 (two) times daily.   Past Week   Cholecalciferol (VITAMIN D3) 50 MCG (2000 UT) TABS Take 4,000 Units by mouth daily.   Past Week   clopidogrel (PLAVIX) 75 MG tablet Take 75 mg by mouth daily.   Past Week   escitalopram (LEXAPRO) 10 MG tablet TAKE 1 TABLET BY MOUTH EVERY DAY 90 tablet 1 Past Week   etodolac (LODINE) 500 MG tablet Take 500 mg by mouth daily.   Past Week   furosemide (LASIX) 40 MG tablet Take  0.5 tablets (20 mg total) by mouth daily. 30 tablet  Past Week   gabapentin (NEURONTIN) 300 MG capsule Take 2 capsules (600 mg total) by mouth in the morning and at bedtime. 360 capsule 3 Past Week   ipratropium-albuterol (DUONEB) 0.5-2.5 (3) MG/3ML SOLN Take 3 mLs by nebulization 3 (three) times daily as needed. 360 mL 3 Past Week   lamoTRIgine (LAMICTAL) 100 MG tablet TAKE 1 TAB 2 TIMES DAILY. FURTHER REFILLS NEW PSYCH FOR ALL PSYCH MEDS ONLY TEMP SUPPLY FROM PCP 180 tablet 1 Past Week   leflunomide (ARAVA) 20 MG tablet Take 1 tablet (20 mg total) by mouth daily.   Past Week   lidocaine (LIDODERM) 5 % Place 1 patch onto the skin 2 (two) times daily as needed. Remove & Discard patch within 12 hours or as directed by MD (Patient taking differently: Place 1 patch onto the skin daily. Remove & Discard patch within 12 hours or as directed by MD) 120 patch 2 Past Week   lovastatin (MEVACOR) 20 MG tablet TAKE 1 TABLET BY MOUTH EVERYDAY AT  BEDTIME *STOP TALKING '40MG'$ * (Patient taking differently: Take 20 mg by mouth at bedtime.) 90 tablet 3 Past Week   methocarbamol (ROBAXIN) 500 MG tablet Take 500 mg by mouth 4 (four) times daily as needed.   Past Week   mirabegron ER (MYRBETRIQ) 50 MG TB24 tablet Take 1 tablet (50 mg total) by mouth daily. 90 tablet 3 Past Week   montelukast (SINGULAIR) 10 MG tablet TAKE 1 TABLET BY MOUTH EVERY DAY 90 tablet 1 Past Week   multivitamin-lutein (OCUVITE-LUTEIN) CAPS capsule Take 1 capsule by mouth at bedtime.   Past Week   nebivolol (BYSTOLIC) 10 MG tablet Take 0.5 tablets (5 mg total) by mouth in the morning and at bedtime.   Past Week   oxyCODONE-acetaminophen (PERCOCET) 5-325 MG tablet Take 1 tablet by mouth every 4 (four) hours as needed for severe pain. 15 tablet 0 Past Week   pantoprazole (PROTONIX) 40 MG tablet TAKE 1 TABLET BY MOUTH 2 TIMES DAILY 30 MIN BEFORE FOOD (NOTE REDUCTION IN FREQUENCY) 180 tablet 1 Past Week   predniSONE (DELTASONE) 20 MG tablet Take 2 tablets (40 mg total) by mouth daily with breakfast. 8 tablet 0 Past Week   QUEtiapine (SEROQUEL) 25 MG tablet TAKE 1 TABLET (25 MG TOTAL) BY MOUTH AT BEDTIME. AGAIN LAST FILL FURTHER REFILLS FROM PSYCHIATRY NO EXCEPTIONS 90 tablet 0 Past Week   sucralfate (CARAFATE) 1 g tablet Take 1 tablet (1 g total) by mouth 2 (two) times daily. 180 tablet 3 Past Week   Adalimumab 40 MG/0.4ML PNKT Inject 40 mg into the skin every 14 (fourteen) days.      albuterol (VENTOLIN HFA) 108 (90 Base) MCG/ACT inhaler Inhale 2 puffs into the lungs every 6 (six) hours as needed for wheezing or shortness of breath. 18 g 11 prn   amLODipine (NORVASC) 5 MG tablet Take 5 mg by mouth daily.      cyclobenzaprine (FLEXERIL) 10 MG tablet Take 1 tablet (10 mg total) by mouth 3 (three) times daily as needed for muscle spasms. 30 tablet 0 prn   dicyclomine (BENTYL) 10 MG capsule Take 1 capsule (10 mg total) by mouth 2 (two) times daily as needed for spasms. (Patient taking  differently: Take 20 mg by mouth 2 (two) times daily.) 180 capsule 3 prn   famotidine (PEPCID) 20 MG tablet TAKE 1 TABLET (20 MG TOTAL) BY MOUTH DAILY. BEFORE BREAKFAST OR DINNER 90 tablet  1 prn   fluticasone (FLONASE) 50 MCG/ACT nasal spray Place 2 sprays into both nostrils daily. In am   prn   OXYGEN Inhale 2 L into the lungs at bedtime.      zoledronic acid (RECLAST) 5 MG/100ML SOLN injection Inject 5 mg into the vein once.       Social History   Socioeconomic History   Marital status: Widowed    Spouse name: richard   Number of children: 2   Years of education: Some Coll   Highest education level: Some college, no degree  Occupational History   Occupation: Retired    Comment: retired  Tobacco Use   Smoking status: Former    Packs/day: 0.50    Years: 20.00    Total pack years: 10.00    Types: Cigarettes    Quit date: 02/02/1974    Years since quitting: 47.5   Smokeless tobacco: Never  Vaping Use   Vaping Use: Never used  Substance and Sexual Activity   Alcohol use: No   Drug use: No   Sexual activity: Not Currently  Other Topics Concern   Not on file  Social History Narrative   Lives at home in [place] grandson, his wife and their child.   Husband Jillayne Witte died covid 29 late 1/2058mrried x 565years   Right-handed.   6 cups coffee per day.   Social Determinants of Health   Financial Resource Strain: Low Risk  (06/18/2021)   Overall Financial Resource Strain (CARDIA)    Difficulty of Paying Living Expenses: Not hard at all  Food Insecurity: No Food Insecurity (06/18/2021)   Hunger Vital Sign    Worried About Running Out of Food in the Last Year: Never true    Ran Out of Food in the Last Year: Never true  Transportation Needs: Unmet Transportation Needs (06/26/2021)   PRAPARE - Transportation    Lack of Transportation (Medical): Yes    Lack of Transportation (Non-Medical): Yes  Physical Activity: Inactive (11/24/2019)   Exercise Vital Sign    Days of  Exercise per Week: 0 days    Minutes of Exercise per Session: 0 min  Stress: No Stress Concern Present (06/18/2021)   [place]   Feeling of Stress : Not at all  Social Connections: Moderately Integrated (06/18/2021)   Social Connection and Isolation Panel [NHANES]    Frequency of Communication with Friends and Family: More than three times a week    Frequency of Social Gatherings with Friends and Family: Never    Attends Religious Services: More than 4 times per year    Active Member of [employer] Organizations: No    Attends CArchivistMeetings: Never    Marital Status: Married  IHuman resources officerViolence: Not At Risk (06/18/2021)   Humiliation, Afraid, Rape, and Kick questionnaire    Fear of Current or Ex-Partner: No    Emotionally Abused: No    Physically Abused: No    Sexually Abused: No    Family History  Problem Relation Age of Onset   Rheum arthritis Mother    Asthma Mother    Parkinson's disease Mother    Heart disease Mother    Stroke Mother    Hypertension Mother    Heart attack Father    Heart disease Father    Hypertension Father    Peripheral Artery Disease Father    Diabetes Son    Gout Son    Asthma  Sister    Heart disease Sister    Lung cancer Sister    Heart disease Sister    Heart disease Sister    Breast cancer Sister    Heart attack Sister    Heart disease Brother    Heart disease Maternal Grandmother    Diabetes Maternal Grandmother    Colon cancer Maternal Grandmother    Cancer Maternal Grandmother        Hodgkins lymphoma   Heart disease Brother    Alcohol abuse Brother    Depression Brother    Dementia Son       PHYSICAL EXAM General: Elderly Caucasian female, well nourished, in no acute distress. Sitting upright in recliner HEENT:  Normocephalic and atraumatic. Neck:  No JVD.  Lungs: Normal respiratory effort on 2L by [place], able to converse at length without  dyspnea.  Clear bilaterally with decreased air entry without appreciable crackles or wheezes. Heart: HRRR with frequent premature beats. Normal S1 and S2, 2/6 systolic murmur best heard at RUSB Abdomen: Soft, nontender, nondistended Msk: Normal strength and tone for age. Extremities: Warm and well perfused. No clubbing, cyanosis.  Trace bilateral lower extremity edema R>L Neuro: Alert and oriented X 3. Psych:  Answers questions appropriately.   Labs:   Lab Results  Component Value Date   WBC 15.1 (H) 07/28/2021   HGB 9.4 (L) 07/28/2021   HCT 31.0 (L) 07/28/2021   MCV 80.3 07/28/2021   PLT 379 07/28/2021    Recent Labs  Lab 07/30/21 0444  NA 141  K 3.6  CL 102  CO2 28  BUN 25*  CREATININE 1.26*  CALCIUM 8.5*  GLUCOSE 94   Lab Results  Component Value Date   CKTOTAL 58 11/24/2013   CKMB 0.7 11/24/2013   TROPONINI <0.03 05/07/2016    Lab Results  Component Value Date   CHOL 176 12/01/2019   CHOL 147 12/01/2018   CHOL 144 01/21/2018   Lab Results  Component Value Date   HDL 53.60 12/01/2019   HDL 42 12/01/2018   HDL 40.50 01/21/2018   Lab Results  Component Value Date   LDLCALC 60 12/01/2018   LDLCALC 86 08/06/2017   LDLCALC 43 06/08/2016   Lab Results  Component Value Date   TRIG 360.0 (H) 12/01/2019   TRIG 227 (H) 12/01/2018   TRIG 255.0 (H) 01/21/2018   Lab Results  Component Value Date   CHOLHDL 3 12/01/2019   CHOLHDL 3.5 12/01/2018   CHOLHDL 4 01/21/2018   Lab Results  Component Value Date   LDLDIRECT 60.0 12/01/2019   LDLDIRECT 60.0 01/21/2018      Radiology: ECHOCARDIOGRAM COMPLETE  Result Date: 07/30/2021    ECHOCARDIOGRAM REPORT   Patient Name:   Kerri Carter Date of Exam: 07/29/2021 Medical Rec #:  [id number]     Height:       61.5 in Accession #:    [id number]    Weight:       173.1 lb Date of Birth:  1941/01/23     BSA:          1.787 m Patient Age:    43 years      BP:           130/52 mmHg Patient Gender: F             HR:            86 bpm. Exam Location:  [facility] Procedure: 2D Echo, Color Doppler and Cardiac Doppler Indications:  Mitral valve Insufficiency I34.0  History:         Patient has prior history of Echocardiogram examinations, most                  recent 05/26/2021. COPD; Risk Factors:Hypertension. Aortic                  stenosis.  Sonographer:     Sherrie Sport Referring Phys:  [id number] [place] Karpf Diagnosing Phys: Isaias Cowman MD  Sonographer Comments: No parasternal window, no subcostal window and Technically challenging study due to limited acoustic windows. Image acquisition challenging due to COPD. IMPRESSIONS  1. Left ventricular ejection fraction, by estimation, is 55 to 60%. The left ventricle has normal function. The left ventricle has no regional wall motion abnormalities. Left ventricular diastolic parameters are consistent with Grade I diastolic dysfunction (impaired relaxation).  2. Right ventricular systolic function is normal. The right ventricular size is normal.  3. The mitral valve is normal in structure. Moderate to severe mitral valve regurgitation. No evidence of mitral stenosis.  4. The aortic valve is normal in structure. Aortic valve regurgitation is moderate. Mild to moderate aortic valve stenosis.  5. The inferior vena cava is normal in size with greater than 50% respiratory variability, suggesting right atrial pressure of 3 mmHg. FINDINGS  Left Ventricle: Left ventricular ejection fraction, by estimation, is 55 to 60%. The left ventricle has normal function. The left ventricle has no regional wall motion abnormalities. The left ventricular internal cavity size was normal in size. There is  no left ventricular hypertrophy. Left ventricular diastolic parameters are consistent with Grade I diastolic dysfunction (impaired relaxation). Right Ventricle: The right ventricular size is normal. No increase in right ventricular wall thickness. Right ventricular systolic function is normal. Left Atrium:  Left atrial size was normal in size. Right Atrium: Right atrial size was normal in size. Pericardium: There is no evidence of pericardial effusion. Mitral Valve: The mitral valve is normal in structure. There is moderate thickening of the mitral valve leaflet(s). Moderate to severe mitral valve regurgitation. No evidence of mitral valve stenosis. MV peak gradient, 7.6 mmHg. The mean mitral valve gradient is 4.0 mmHg. Tricuspid Valve: The tricuspid valve is normal in structure. Tricuspid valve regurgitation is mild . No evidence of tricuspid stenosis. Aortic Valve: The aortic valve is normal in structure. Aortic valve regurgitation is moderate. Mild to moderate aortic stenosis is present. Aortic valve mean gradient measures 17.0 mmHg. Aortic valve peak gradient measures 29.4 mmHg. Aortic valve area, by VTI measures 1.37 cm. Pulmonic Valve: The pulmonic valve was normal in structure. Pulmonic valve regurgitation is not visualized. No evidence of pulmonic stenosis. Aorta: The aortic root is normal in size and structure. Venous: The inferior vena cava is normal in size with greater than 50% respiratory variability, suggesting right atrial pressure of 3 mmHg. IAS/Shunts: No atrial level shunt detected by color flow Doppler.  LEFT VENTRICLE PLAX 2D LVIDd:         4.30 cm   Diastology LVIDs:         3.10 cm   LV e' medial:    3.92 cm/s LV PW:         1.30 cm   LV E/e' medial:  26.3 LV IVS:        1.00 cm   LV e' lateral:   3.15 cm/s LVOT diam:     2.00 cm   LV E/e' lateral: 32.7 LV SV:  70 LV SV Index:   39 LVOT Area:     3.14 cm  RIGHT VENTRICLE RV Basal diam:  2.90 cm RV S prime:     10.70 cm/s TAPSE (M-mode): 2.2 cm LEFT ATRIUM              Index        RIGHT ATRIUM           Index LA diam:        4.50 cm  2.52 cm/m   RA Area:     13.30 cm LA Vol (A2C):   56.5 ml  31.62 ml/m  RA Volume:   30.50 ml  17.07 ml/m LA Vol (A4C):   119.0 ml 66.59 ml/m LA Biplane Vol: 86.4 ml  48.35 ml/m  AORTIC VALVE AV Area  (Vmax):    1.23 cm AV Area (Vmean):   1.28 cm AV Area (VTI):     1.37 cm AV Vmax:           271.33 cm/s AV Vmean:          189.000 cm/s AV VTI:            0.515 m AV Peak Grad:      29.4 mmHg AV Mean Grad:      17.0 mmHg LVOT Vmax:         106.00 cm/s LVOT Vmean:        77.200 cm/s LVOT VTI:          0.224 m LVOT/AV VTI ratio: 0.43  AORTA Ao Root diam: 1.80 cm MITRAL VALVE                TRICUSPID VALVE MV Area (PHT): 2.93 cm     TR Peak grad:   20.4 mmHg MV Area VTI:   1.55 cm     TR Vmax:        226.00 cm/s MV Peak grad:  7.6 mmHg MV Mean grad:  4.0 mmHg     SHUNTS MV Vmax:       1.38 m/s     Systemic VTI:  0.22 m MV Vmean:      90.4 cm/s    Systemic Diam: 2.00 cm MV Decel Time: 259 msec MV E velocity: 103.00 cm/s MV A velocity: 120.00 cm/s MV E/A ratio:  0.86 Isaias Cowman MD Electronically signed by Isaias Cowman MD Signature Date/Time: 07/30/2021/12:19:57 PM    Final    DG Chest Portable 1 View  Result Date: 07/28/2021 CLINICAL DATA:  Shortness of breath.  History of COPD. EXAM: PORTABLE CHEST 1 VIEW COMPARISON:  Radiographs 07/22/2021 and 05/29/2021.  CT 07/22/2021. FINDINGS: 1322 hours. Lordotic positioning. The heart size and mediastinal contours are stable. Interval increased ill-defined density in the right upper lobe which could reflect an early infiltrate. The left lung appears clear. There is no evidence of pleural effusion or pneumothorax. Patient is status post bilateral shoulder reverse arthroplasty, and there are several old right-sided rib fractures. No acute osseous findings are evident. Telemetry leads overlie the chest. IMPRESSION: Possible new ill-defined infiltrate or atelectasis in the right upper lobe. Electronically Signed   By: Richardean Sale M.D.   On: 07/28/2021 13:36   CT Chest Wo Contrast  Result Date: 07/22/2021 CLINICAL DATA:  Respiratory illness cough with leg swelling EXAM: CT CHEST WITHOUT CONTRAST TECHNIQUE: Multidetector CT imaging of the chest was  performed following the standard protocol without IV contrast. RADIATION DOSE REDUCTION: This exam was performed according to the departmental  dose-optimization program which includes automated exposure control, adjustment of the mA and/or kV according to patient size and/or use of iterative reconstruction technique. COMPARISON:  Chest x-ray 07/22/2021, chest CT 03/21/2011, 05/01/2020 FINDINGS: Cardiovascular: Limited evaluation without intravenous contrast. Moderate aortic atherosclerosis. No aneurysm. Coronary vascular calcifications. No pericardial effusion. Mitral calcification. Mediastinum/Nodes: Midline trachea. No thyroid mass. No suspicious lymph nodes. Esophagus within normal limits. Lungs/Pleura: No consolidation, pleural effusion or pneumothorax. Mild lower lobe bronchial wall thickening with scattered mucous plugging. Mild tree-in-bud nodularity in the left lower lobe, series 3 image 79. Upper Abdomen: No acute abnormality. Musculoskeletal: No acute osseous abnormality. Previously noted nodular opacity may correspond to lead attachment on the left upper anterior chest wall. IMPRESSION: 1. No suspicious pulmonary nodule to correspond to the questioned radiographic abnormality. 2. Mild lower lobe bronchial wall thickening with scattered mucous plugging. Small amount of tree-in-bud nodularity in the left lower lobe suggesting respiratory infection. Aortic Atherosclerosis (ICD10-I70.0). Electronically Signed   By: Donavan Foil M.D.   On: 07/22/2021 19:50   DG Chest 2 View  Result Date: 07/22/2021 CLINICAL DATA:  Cough and swelling in the legs EXAM: CHEST - 2 VIEW COMPARISON:  Chest radiograph 05/29/2021 FINDINGS: The cardiomediastinal silhouette is normal. The lungs hyperinflated with flattening of the diaphragms consistent with underlying COPD. There is no focal consolidation or pulmonary edema. Previously seen opacities in the left lung have significantly improved/resolved. There is no pleural  effusion or pneumothorax. There is a 1.3 cm nodular opacity in the left apex not seen on prior studies. Bilateral shoulder arthroplasty hardware, left clavicle surgical fixation, and lumbar spine fusion hardware are noted. Opacities projecting over the right fifth and sixth ribs are consistent with reflect old fractures. IMPRESSION: 1. No radiographic evidence of acute cardiopulmonary process. Previously seen opacities in the left lung on the study from 05/29/2021 have resolved. 2. 1.3 cm nodular opacity in the left apex not seen on prior studies. Recommend nonemergent chest CT to evaluate for a pulmonary nodule. Electronically Signed   By: Valetta Mole M.D.   On: 07/22/2021 18:06   CT HEAD WO CONTRAST (5MM)  Result Date: 07/17/2021 CLINICAL DATA:  Fall EXAM: CT HEAD WITHOUT CONTRAST TECHNIQUE: Contiguous axial images were obtained from the base of the skull through the vertex without intravenous contrast. RADIATION DOSE REDUCTION: This exam was performed according to the departmental dose-optimization program which includes automated exposure control, adjustment of the mA and/or kV according to patient size and/or use of iterative reconstruction technique. COMPARISON:  02/10/2021 FINDINGS: Brain: No evidence of acute infarction, hemorrhage, cerebral edema, mass, mass effect, or midline shift. No extra-axial fluid collection. Unchanged size and configuration of the ventricles, favored to be related to cerebral volume loss, although normal pressure hydrocephalus can appear similar. Periventricular white matter changes, likely the sequela of chronic small vessel ischemic disease. Vascular: No hyperdense vessel. Atherosclerotic calcifications in the intracranial carotid and vertebral arteries. Skull: Normal. Negative for fracture or focal lesion. Sinuses/Orbits: Mucosal thickening in the left-greater-than-right ethmoid air cells. Status post bilateral lens replacements. Other: The mastoid air cells are well  aerated. IMPRESSION: No acute intracranial process. Electronically Signed   By: Merilyn Baba M.D.   On: 07/17/2021 14:55   DG Humerus Right  Result Date: 07/17/2021 CLINICAL DATA:  Right arm pain after fall. EXAM: RIGHT HUMERUS - 2+ VIEW COMPARISON:  None Available. FINDINGS: Status post right shoulder arthroplasty. Old right rib fractures are noted. No acute fracture or dislocation is noted. IMPRESSION: No acute abnormality seen. Electronically  Signed   By: Marijo Conception M.D.   On: 07/17/2021 14:20   DG Shoulder Right  Result Date: 07/17/2021 CLINICAL DATA:  Right arm pain after fall. EXAM: RIGHT SHOULDER - 2+ VIEW COMPARISON:  None Available. FINDINGS: Status post right total shoulder arthroplasty. Glenoid and humeral components are well situated. Old right rib fractures are noted. No acute fracture or dislocation is noted. IMPRESSION: No acute abnormality is noted. Electronically Signed   By: Marijo Conception M.D.   On: 07/17/2021 14:19    ECHO 05/26/2021  1. Left ventricular ejection fraction, by estimation, is 50 to 55%. The  left ventricle has low normal function. Left ventricular endocardial  border not optimally defined to evaluate regional wall motion. Left  ventricular diastolic parameters are  consistent with Grade II diastolic dysfunction (pseudonormalization).   2. Right ventricular systolic function is normal. The right ventricular  size is normal. Tricuspid regurgitation signal is inadequate for assessing  PA pressure.   3. Left atrial size was mildly dilated.   4. The mitral valve is normal in structure. Severe mitral valve  regurgitation. Mild mitral stenosis. The mean mitral valve gradient is 7.0  mmHg. Moderate mitral annular calcification.   5. The aortic valve was not well visualized. Aortic valve regurgitation  is moderate. Mild to moderate aortic valve stenosis. Aortic valve mean  gradient measures 18.0 mmHg.   6. The inferior vena cava is normal in size with  greater than 50%  respiratory variability, suggesting right atrial pressure of 3 mmHg.   7. Challenging image quality. Consider a TEE to evaluate mitral valve.   EKG reviewed by me: on admission, sinus rhythm LVH rate 99   ASSESSMENT AND PLAN:  Kerri Carter is a 81 year old female with a PMH of HFpEF (LVEF 55-60%, g1DD 07/29/21), mod-severe MR, mi-mod AS, COPD, OSA on 2L at night, nonobstructive CAD by heart cath 03/2011, hypertension, hyperlipidemia, PAD, CKD3 with history of R nephrectomy 1988, conversion disorder, anxiety, L3-L4 disc herniation s/p lumbar fusion extension 05/23/21 with 10 day hospitalization complicated by respiratory failure 2/2 pneumonia and new onset paroxysmal atrial fibrillation who presented to [facility] ED on 07/28/2021 with shortness of breath. Cardiology is consulted for further assistance.   #acute on chronic respiratory failure 2/2 pneumonia #acute on chronic HFpEF (LVEF 55-60%, g1DD)  #mod-severe MR, mild - moderate AS by echo 07/29/21 She presents with worsening dyspnea on exertion following a 2-day hospitalization (discharged 6/22) after being treated for left lower lobe pneumonia.  She reports worsening lower extremity edema, orthopnea, and in and out of atrial fibrillation versus sinus rhythm with frequent PACs since presentation.  She does have known severe MR and mild to moderate aortic stenosis in addition to diastolic heart failure  - complicating her decompensation due to the respiratory illness.  Per the patient and her grandson she reportedly has clinically improved from a volume overload standpoint after 40 mg of IV Lasix x2 overnight.  BNP was elevated to 670 on admission.  She is subjectively short of breath and also on 3 L on top of her recent baseline of only 2 L at night., but able to carry on lengthy conversations with me without increased work of breathing -Agree with current therapy per primary team for pneumonia. -S/p 40 mg of IV Lasix x2 with bump in  creatinine from 0.99 to 1.28 - increased to 1.6 yesterday afternoon and back to 1.26 and GFR 44 today. -will start PO torsemide '20mg'$  once daily  -Continue GDMT with  home Bystolic 5 mg twice daily, not on ACEi/ARB due to anaphylaxis with lisinopril, can consider addition of spironolactone pending her renal function -Repeat echocardiogram looks overall similar to prior study in April 2023 -Will need close follow-up with her regular cardiologist at [facility] at discharge (has an appointment with Dr. Marcello Moores Povsic on 08/15/2021)  #Elevated troponin Minimally elevated and flat trending at 77-69, in the absence of chest pain this likely represents demand ischemia and not ACS.  #paroxysmal atrial fibrillation -Discovered during hospitalization in April 2023 for elective lumbar fusion, neurosurgery recommended starting anticoagulation after at least 2 weeks postop, but unfortunately she never followed up with cardiology and was thus never started on [facility]. -She is currently in sinus rhythm with frequent PACs, on telemetry has some paroxysms of A-fib, rate controlled  -Her CHA2DS2-VASc is 4, so systemic AC is recommended for stroke risk reduction.  Discussed risks and benefits with the patient and she is agreeable to start Eliquis 5 mg twice daily. -she has been on plavix '75mg'$  daily after a TIA ~ 1 year ago, will d/c this in favor of eliquis to reduce bleeding risk   This patient's plan of care was discussed and created with Dr. Saralyn Pilar and he is in agreement.  Signed: Tristan Schroeder , PA-C 07/30/2021, 1:05 PM [facility]

## 2021-07-30 NOTE — Progress Notes (Addendum)
PROGRESS NOTE    Kerri Carter  IRC:[id number] DOB: [birth year] DOA: 07/28/2021 PCP: McLean-Scocuzza, Nino Glow, MD    Brief Narrative:  81 year old female history of anxiety with depression, CKD 3A, status post lumbar fusion, COPD, chronic hypoxic respiratory failure requiring 2 L nasal cannula nightly, bipolar disorder, hypertension, GERD, PAD, peptic ulcer disease, L3-L4 disc herniation with severe stenosis, who presents emergency department for chief concerns of shortness of breath.She was recently hospitalized 6/20-6/22/23 for LLL infiltrate treated with Rocpehin and Azithromycin, discharged on Augmentin along with prednisone 40 mg daily. At discharge she was able to ambulate.   Over the past 36 hrs she has had increased SOB. She denies fevers, chills, productive cough, chest pain. EMS was initiated and on arrival found the patient to be hypoxemic. She was administered nebulizer treatments and started on BiPAP wile being transported to [facility]   Assessment & Plan:   Principal Problem:   Acute on chronic combined systolic (congestive) and diastolic (congestive) heart failure (HCC) Active Problems:   Chronic obstructive pulmonary disease (COPD) (HCC)   Mixed bipolar I disorder (HCC)   Acute respiratory failure with hypoxia (HCC)   Acute on chronic combined systolic and diastolic CHF (congestive heart failure) (HCC)   OSA (obstructive sleep apnea)   Hyperlipidemia   Anemia   Essential hypertension   Atrial fibrillation (HCC)  Dyspnea Acute on chronic diastolic CHF Mitral regurgitation Suspect multifactorial with psychiatric component.  Presents with DOE. EMS found her to be hypoxemic and was placed on bipap.  Quickly weaned off that and here breathing comfortably speaking in complete sentences on 3L currently (2 L prn at home).  No cough or fevers to suggest infectious process.  Bnp chronically elevated and elevated on presentation.  TTE from 2 months ago showed preserved EF, grade  2 diastolic dysfunction with severe mitral regurg.  Low suspicion for infectious process Suspect mild decompensated HF Responded well to Lasix 40 mg IV x2 doses However creatinine worsened subsequently Plan: Torsemide '20mg'$  QD Strict I/O Daily weights  Daily renal function Anticipate discharge 6/29 Cardiology outpatient follow-up   Acute respiratory failure on chronic respiratory failure On 2 L prn at home, mostly at night. Presented with dyspnea on bipap, quickly weaned off.  Now back on baseline 2 L and breathing comfortably - continue O2, wean as able   Paroxysmal atrial fibrillation Here sinus rhythm. Recent diagnosis, wasn't started on anticoagulation - cont BB -Started on Eliquis 6/27   Recent lumbar surgery Lumbar fusion and arthrodesis performed 05/2021. Appears stable.   Acute kidney injury Cr 1.28 on presentation, baseline is around 1. Received lasix 40 iv x2, creatinine subsequently worsened.  Diuretics held on 6/27  Plan:  Torsemide per cardiology recommendations  Check a.m. BMP   Debility Evaluated by PT last hospitalization, rec was home with home health.  Patient feels she wasn't strong enough to go home though she also says she won't go to skilled nursing. -Latest PT recommendations for home with home health   COPD Recently treated for exacerbation with antibiotics. Completed steroid burst yesteday -Continue prednisone taper, last dose 6/29 - home budesonide, singulair   HTN Here normotensive - cont home amlodipine, nebivolol, pravastatin   MDD/GAD - home buspar, lexapro   bipolar - home lamictal, seroquel   RA - cont home arava  DVT prophylaxis: Eliquis Code Status: DNR Family Communication:  son Marlou Sa [phone] on 6/28 Disposition Plan: Status is: Inpatient Remains inpatient appropriate because: Decompensated heart failure, AKI.  Anticipate discharge  6/29   Level of care: Telemetry Cardiac  Consultants:  Cardiology  Procedures:   None  Antimicrobials: None   Subjective: Seen and examined.  Sitting comfortably in bed.  No visible distress.  No pain complaints.  Answers all questions appropriately.  Reports improvement in respiratory status.  Objective: Vitals:   07/30/21 1052 07/30/21 1134 07/30/21 1234 07/30/21 1515  BP:   121/65 123/61  Pulse:   75 72  Resp:   18 19  Temp:    98.1 F (36.7 C)  TempSrc:      SpO2: 94% 97% 93% 92%  Weight:      Height:        Intake/Output Summary (Last 24 hours) at 07/30/2021 1551 Last data filed at 07/30/2021 0800 Gross per 24 hour  Intake 240 ml  Output 1600 ml  Net -1360 ml   Filed Weights   07/28/21 1317 07/30/21 0337  Weight: 78.5 kg 80.8 kg    Examination:  General exam: Appears calm and comfortable  Respiratory system: Clear to auscultation. Respiratory effort normal. Cardiovascular system: S1-S2, RRR, no murmurs, no pedal edema Gastrointestinal system: Soft, NT/ND, normal bowel sounds Central nervous system: Alert and oriented. No focal neurological deficits. Extremities: Symmetric 5 x 5 power. Skin: No rashes, lesions or ulcers Psychiatry: Judgement and insight appear normal. Mood & affect appropriate.     Data Reviewed: I have personally reviewed following labs and imaging studies  CBC: Recent Labs  Lab 07/24/21 0414 07/28/21 1319  WBC 16.0* 15.1*  NEUTROABS  --  11.7*  HGB 8.2* 9.4*  HCT 27.3* 31.0*  MCV 81.7 80.3  PLT 281 540   Basic Metabolic Panel: Recent Labs  Lab 07/24/21 0414 07/29/21 0550 07/29/21 1529 07/30/21 0444  NA 139 139 138 141  K 4.1 3.5 3.8 3.6  CL 109 103 99 102  CO2 '24 27 27 28  '$ GLUCOSE 117* 90 206* 94  BUN 21 25* 28* 25*  CREATININE 0.99 1.28* 1.60* 1.26*  CALCIUM 8.3* 8.1* 8.3* 8.5*   GFR: Estimated Creatinine Clearance: 35.8 mL/min (A) (by C-G formula based on SCr of 1.26 mg/dL (H)). Liver Function Tests: No results for input(s): "AST", "ALT", "ALKPHOS", "BILITOT", "PROT", "ALBUMIN" in the last  168 hours. No results for input(s): "LIPASE", "AMYLASE" in the last 168 hours. No results for input(s): "AMMONIA" in the last 168 hours. Coagulation Profile: No results for input(s): "INR", "PROTIME" in the last 168 hours. Cardiac Enzymes: No results for input(s): "CKTOTAL", "CKMB", "CKMBINDEX", "TROPONINI" in the last 168 hours. BNP (last 3 results) No results for input(s): "PROBNP" in the last 8760 hours. HbA1C: No results for input(s): "HGBA1C" in the last 72 hours. CBG: No results for input(s): "GLUCAP" in the last 168 hours. Lipid Profile: No results for input(s): "CHOL", "HDL", "LDLCALC", "TRIG", "CHOLHDL", "LDLDIRECT" in the last 72 hours. Thyroid Function Tests: No results for input(s): "TSH", "T4TOTAL", "FREET4", "T3FREE", "THYROIDAB" in the last 72 hours. Anemia Panel: No results for input(s): "VITAMINB12", "FOLATE", "FERRITIN", "TIBC", "IRON", "RETICCTPCT" in the last 72 hours. Sepsis Labs: Recent Labs  Lab 07/28/21 1319 07/28/21 1513 07/28/21 1519  PROCALCITON  --  <0.10  --   LATICACIDVEN 1.4  --  1.0    Recent Results (from the past 240 hour(s))  SARS Coronavirus 2 by RT PCR (hospital order, performed in [facility] hospital lab) *cepheid single result test* Anterior Nasal Swab     Status: None   Collection Time: 07/22/21  6:49 PM   Specimen: Anterior Nasal  Swab  Result Value Ref Range Status   SARS Coronavirus 2 by RT PCR NEGATIVE NEGATIVE Final    Comment: (NOTE) SARS-CoV-2 target nucleic acids are NOT DETECTED.  The SARS-CoV-2 RNA is generally detectable in upper and lower respiratory specimens during the acute phase of infection. The lowest concentration of SARS-CoV-2 viral copies this assay can detect is 250 copies / mL. A negative result does not preclude SARS-CoV-2 infection and should not be used as the sole basis for treatment or other patient management decisions.  A negative result may occur with improper specimen collection / handling, submission  of specimen other than nasopharyngeal swab, presence of viral mutation(s) within the areas targeted by this assay, and inadequate number of viral copies (<250 copies / mL). A negative result must be combined with clinical observations, patient history, and epidemiological information.  Fact Sheet for Patients:   https://www.patel.info/  Fact Sheet for Healthcare Providers: https://hall.com/  This test is not yet approved or  cleared by the [place] FDA and has been authorized for detection and/or diagnosis of SARS-CoV-2 by FDA under an Emergency Use Authorization (EUA).  This EUA will remain in effect (meaning this test can be used) for the duration of the COVID-19 declaration under Section 564(b)(1) of the Act, 21 U.S.C. section 360bbb-3(b)(1), unless the authorization is terminated or revoked sooner.  Performed at [facility], [address] [place], [place] [address]   Blood culture (routine x 2)     Status: None   Collection Time: 07/22/21  7:40 PM   Specimen: BLOOD  Result Value Ref Range Status   Specimen Description BLOOD LEFT ASSIST CONTROL  Final   Special Requests   Final    BOTTLES DRAWN AEROBIC AND ANAEROBIC Blood Culture adequate volume   Culture   Final    NO GROWTH 5 DAYS Performed at [facility], [address] [place], [place] [address]    Report Status 07/27/2021 FINAL  Final  Blood culture (routine x 2)     Status: None   Collection Time: 07/22/21  7:41 PM   Specimen: BLOOD  Result Value Ref Range Status   Specimen Description BLOOD RIGHT HAND  Final   Special Requests   Final    BOTTLES DRAWN AEROBIC AND ANAEROBIC Blood Culture adequate volume   Culture   Final    NO GROWTH 5 DAYS Performed at [facility], [address], [place], [place] [address]    Report Status 07/27/2021 FINAL  Final  Expectorated Sputum Assessment w Gram Stain, Rflx to Resp Cult     Status: None    Collection Time: 07/23/21  8:51 AM   Specimen: Sputum  Result Value Ref Range Status   Specimen Description SPUTUM  Final   Special Requests NONE  Final   Sputum evaluation   Final    THIS SPECIMEN IS ACCEPTABLE FOR SPUTUM CULTURE Performed at [facility], [address] [place], [place] [address]    Report Status 07/23/2021 FINAL  Final  Culture, Respiratory w Gram Stain     Status: None   Collection Time: 07/23/21  8:51 AM   Specimen: SPU  Result Value Ref Range Status   Specimen Description   Final    SPUTUM Performed at [facility], [address], [place], [place] [address]    Special Requests   Final    NONE Reflexed from [id number] Performed at [facility], [address] [place], [place] [address]  Gram Stain NO WBC SEEN RARE BUDDING YEAST SEEN   Final   Culture   Final    FEW Normal respiratory flora-no Staph aureus or Pseudomonas seen Performed at [place] [address] [place], [place] [address]    Report Status 07/26/2021 FINAL  Final  Respiratory (~20 pathogens) panel by PCR     Status: None   Collection Time: 07/23/21 12:07 PM   Specimen: Nasopharyngeal Swab; Respiratory  Result Value Ref Range Status   Adenovirus NOT DETECTED NOT DETECTED Final   Coronavirus 229E NOT DETECTED NOT DETECTED Final    Comment: (NOTE) The Coronavirus on the Respiratory Panel, DOES NOT test for the novel  Coronavirus (2019 nCoV)    Coronavirus HKU1 NOT DETECTED NOT DETECTED Final   Coronavirus NL63 NOT DETECTED NOT DETECTED Final   Coronavirus OC43 NOT DETECTED NOT DETECTED Final   Metapneumovirus NOT DETECTED NOT DETECTED Final   Rhinovirus / Enterovirus NOT DETECTED NOT DETECTED Final   Influenza A NOT DETECTED NOT DETECTED Final   Influenza B NOT DETECTED NOT DETECTED Final   Parainfluenza Virus 1 NOT DETECTED NOT DETECTED Final   Parainfluenza Virus 2 NOT DETECTED NOT DETECTED Final   Parainfluenza Virus 3 NOT DETECTED NOT  DETECTED Final   Parainfluenza Virus 4 NOT DETECTED NOT DETECTED Final   Respiratory Syncytial Virus NOT DETECTED NOT DETECTED Final   Bordetella pertussis NOT DETECTED NOT DETECTED Final   Bordetella Parapertussis NOT DETECTED NOT DETECTED Final   Chlamydophila pneumoniae NOT DETECTED NOT DETECTED Final   Mycoplasma pneumoniae NOT DETECTED NOT DETECTED Final    Comment: Performed at [facility], [place]. [address], [place], [place] [address]  Resp Panel by RT-PCR (Flu A&B, Covid) Anterior Nasal Swab     Status: None   Collection Time: 07/28/21  1:19 PM   Specimen: Anterior Nasal Swab  Result Value Ref Range Status   SARS Coronavirus 2 by RT PCR NEGATIVE NEGATIVE Final    Comment: (NOTE) SARS-CoV-2 target nucleic acids are NOT DETECTED.  The SARS-CoV-2 RNA is generally detectable in upper respiratory specimens during the acute phase of infection. The lowest concentration of SARS-CoV-2 viral copies this assay can detect is 138 copies/mL. A negative result does not preclude SARS-Cov-2 infection and should not be used as the sole basis for treatment or other patient management decisions. A negative result may occur with  improper specimen collection/handling, submission of specimen other than nasopharyngeal swab, presence of viral mutation(s) within the areas targeted by this assay, and inadequate number of viral copies(<138 copies/mL). A negative result must be combined with clinical observations, patient history, and epidemiological information. The expected result is Negative.  Fact Sheet for Patients:  EntrepreneurPulse.com.au  Fact Sheet for Healthcare Providers:  IncredibleEmployment.be  This test is no t yet approved or cleared by the [place] FDA and  has been authorized for detection and/or diagnosis of SARS-CoV-2 by FDA under an Emergency Use Authorization (EUA). This EUA will remain  in effect (meaning this test can be used) for  the duration of the COVID-19 declaration under Section 564(b)(1) of the Act, 21 U.S.C.section 360bbb-3(b)(1), unless the authorization is terminated  or revoked sooner.       Influenza A by PCR NEGATIVE NEGATIVE Final   Influenza B by PCR NEGATIVE NEGATIVE Final    Comment: (NOTE) The Xpert Xpress SARS-CoV-2/FLU/RSV plus assay is intended as an aid in the diagnosis of influenza from Nasopharyngeal swab specimens and should not be used as  a sole basis for treatment. Nasal washings and aspirates are unacceptable for Xpert Xpress SARS-CoV-2/FLU/RSV testing.  Fact Sheet for Patients: EntrepreneurPulse.com.au  Fact Sheet for Healthcare Providers: IncredibleEmployment.be  This test is not yet approved or cleared by the [place] FDA and has been authorized for detection and/or diagnosis of SARS-CoV-2 by FDA under an Emergency Use Authorization (EUA). This EUA will remain in effect (meaning this test can be used) for the duration of the COVID-19 declaration under Section 564(b)(1) of the Act, 21 U.S.C. section 360bbb-3(b)(1), unless the authorization is terminated or revoked.  Performed at [facility], [address], [place], [place] [address]          Radiology Studies: ECHOCARDIOGRAM COMPLETE  Result Date: 07/30/2021    ECHOCARDIOGRAM REPORT   Patient Name:   SHARYON PEITZ Date of Exam: 07/29/2021 Medical Rec #:  [id number]     Height:       61.5 in Accession #:    [id number]    Weight:       173.1 lb Date of Birth:  1941-01-12     BSA:          1.787 m Patient Age:    70 years      BP:           130/52 mmHg Patient Gender: F             HR:           86 bpm. Exam Location:  [facility] Procedure: 2D Echo, Color Doppler and Cardiac Doppler Indications:     Mitral valve Insufficiency I34.0  History:         Patient has prior history of Echocardiogram examinations, most                  recent 05/26/2021. COPD; Risk Factors:Hypertension.  Aortic                  stenosis.  Sonographer:     Sherrie Sport Referring Phys:  [id number] [place] TANG Diagnosing Phys: Isaias Cowman MD  Sonographer Comments: No parasternal window, no subcostal window and Technically challenging study due to limited acoustic windows. Image acquisition challenging due to COPD. IMPRESSIONS  1. Left ventricular ejection fraction, by estimation, is 55 to 60%. The left ventricle has normal function. The left ventricle has no regional wall motion abnormalities. Left ventricular diastolic parameters are consistent with Grade I diastolic dysfunction (impaired relaxation).  2. Right ventricular systolic function is normal. The right ventricular size is normal.  3. The mitral valve is normal in structure. Moderate to severe mitral valve regurgitation. No evidence of mitral stenosis.  4. The aortic valve is normal in structure. Aortic valve regurgitation is moderate. Mild to moderate aortic valve stenosis.  5. The inferior vena cava is normal in size with greater than 50% respiratory variability, suggesting right atrial pressure of 3 mmHg. FINDINGS  Left Ventricle: Left ventricular ejection fraction, by estimation, is 55 to 60%. The left ventricle has normal function. The left ventricle has no regional wall motion abnormalities. The left ventricular internal cavity size was normal in size. There is  no left ventricular hypertrophy. Left ventricular diastolic parameters are consistent with Grade I diastolic dysfunction (impaired relaxation). Right Ventricle: The right ventricular size is normal. No increase in right ventricular wall thickness. Right ventricular systolic function is normal. Left Atrium: Left atrial size was normal in size. Right Atrium: Right atrial size was normal in size. Pericardium: There is no  evidence of pericardial effusion. Mitral Valve: The mitral valve is normal in structure. There is moderate thickening of the mitral valve leaflet(s). Moderate to severe  mitral valve regurgitation. No evidence of mitral valve stenosis. MV peak gradient, 7.6 mmHg. The mean mitral valve gradient is 4.0 mmHg. Tricuspid Valve: The tricuspid valve is normal in structure. Tricuspid valve regurgitation is mild . No evidence of tricuspid stenosis. Aortic Valve: The aortic valve is normal in structure. Aortic valve regurgitation is moderate. Mild to moderate aortic stenosis is present. Aortic valve mean gradient measures 17.0 mmHg. Aortic valve peak gradient measures 29.4 mmHg. Aortic valve area, by VTI measures 1.37 cm. Pulmonic Valve: The pulmonic valve was normal in structure. Pulmonic valve regurgitation is not visualized. No evidence of pulmonic stenosis. Aorta: The aortic root is normal in size and structure. Venous: The inferior vena cava is normal in size with greater than 50% respiratory variability, suggesting right atrial pressure of 3 mmHg. IAS/Shunts: No atrial level shunt detected by color flow Doppler.  LEFT VENTRICLE PLAX 2D LVIDd:         4.30 cm   Diastology LVIDs:         3.10 cm   LV e' medial:    3.92 cm/s LV PW:         1.30 cm   LV E/e' medial:  26.3 LV IVS:        1.00 cm   LV e' lateral:   3.15 cm/s LVOT diam:     2.00 cm   LV E/e' lateral: 32.7 LV SV:         70 LV SV Index:   39 LVOT Area:     3.14 cm  RIGHT VENTRICLE RV Basal diam:  2.90 cm RV S prime:     10.70 cm/s TAPSE (M-mode): 2.2 cm LEFT ATRIUM              Index        RIGHT ATRIUM           Index LA diam:        4.50 cm  2.52 cm/m   RA Area:     13.30 cm LA Vol (A2C):   56.5 ml  31.62 ml/m  RA Volume:   30.50 ml  17.07 ml/m LA Vol (A4C):   119.0 ml 66.59 ml/m LA Biplane Vol: 86.4 ml  48.35 ml/m  AORTIC VALVE AV Area (Vmax):    1.23 cm AV Area (Vmean):   1.28 cm AV Area (VTI):     1.37 cm AV Vmax:           271.33 cm/s AV Vmean:          189.000 cm/s AV VTI:            0.515 m AV Peak Grad:      29.4 mmHg AV Mean Grad:      17.0 mmHg LVOT Vmax:         106.00 cm/s LVOT Vmean:        77.200 cm/s  LVOT VTI:          0.224 m LVOT/AV VTI ratio: 0.43  AORTA Ao Root diam: 1.80 cm MITRAL VALVE                TRICUSPID VALVE MV Area (PHT): 2.93 cm     TR Peak grad:   20.4 mmHg MV Area VTI:   1.55 cm     TR Vmax:  226.00 cm/s MV Peak grad:  7.6 mmHg MV Mean grad:  4.0 mmHg     SHUNTS MV Vmax:       1.38 m/s     Systemic VTI:  0.22 m MV Vmean:      90.4 cm/s    Systemic Diam: 2.00 cm MV Decel Time: 259 msec MV E velocity: 103.00 cm/s MV A velocity: 120.00 cm/s MV E/A ratio:  0.86 Isaias Cowman MD Electronically signed by Isaias Cowman MD Signature Date/Time: 07/30/2021/12:19:57 PM    Final         Scheduled Meds:  amLODipine  5 mg Oral Daily   apixaban  5 mg Oral BID   budesonide  0.25 mg Nebulization BID   busPIRone  5 mg Oral BID   cholecalciferol  4,000 Units Oral Daily   escitalopram  10 mg Oral Daily   etodolac  400 mg Oral Daily   gabapentin  600 mg Oral BID   ipratropium-albuterol  3 mL Nebulization TID   lamoTRIgine  100 mg Oral BID   leflunomide  20 mg Oral Daily   mirabegron ER  50 mg Oral Daily   montelukast  10 mg Oral QHS   nebivolol  5 mg Oral BH-qamhs   pantoprazole  40 mg Oral BID   pravastatin  20 mg Oral q1800   predniSONE  20 mg Oral Q breakfast   QUEtiapine  25 mg Oral QHS   sodium chloride flush  3 mL Intravenous Q12H   sterile water (preservative free)       sucralfate  1 g Oral BID AC   torsemide  20 mg Oral Daily   Continuous Infusions:  sodium chloride       LOS: 2 days      Sidney Ace, MD [facility]   If 7PM-7AM, please contact night-coverage  07/30/2021, 3:51 PM

## 2021-07-30 NOTE — Progress Notes (Signed)
  Chaplain On-Call responded to Spiritual Care Consult Order from Laurey Arrow, MD, for Advance Directives information for the patient.  Chaplain met the patient and provided the AD documents and education about them.  Chaplain also informed the patient about the process for completion of the documents if she chooses while in the hospital.  Chaplain offered prayer and spiritual and emotional support.  Chaplain Pollyann Samples M.Div., [facility]

## 2021-07-30 NOTE — Plan of Care (Signed)
  Problem: Health Behavior/Discharge Planning: Goal: Ability to manage health-related needs will improve Outcome: Progressing   Problem: Clinical Measurements: Goal: Respiratory complications will improve Outcome: Progressing   Problem: Clinical Measurements: Goal: Cardiovascular complication will be avoided Outcome: Progressing   Problem: Safety: Goal: Ability to remain free from injury will improve Outcome: Progressing   

## 2021-07-30 NOTE — Consult Note (Addendum)
  Heart Failure Nurse Navigator Note  HFpEF 50 to 55%.  Grade 2 diastolic dysfunction.  Severe mitral regurgitation mild mitral stenosis.  Mild to moderate aortic stenosis.  Echocardiogram to be performed on this admission results are still pending.  She presented to the emergency room with complaints of 1 week of shortness of breath, lower extremity edema and orthopnea.  BNP was 670.  Chest CT suggesting respiratory infection.  Comorbidities:  COPD with home nocturnal O2 use Asthma Chronic kidney disease Hyperlipidemia Arthritis Single kidney Hypertension Hyperlipidemia Peripheral arterial disease Anxiety Atrial fibrillation  Medications:  Amlodipine 5 mg daily Apixaban 5 mg 2 times a day Pravachol 20 mg daily Prednisone 20 mg with breakfast  Not on ACE or ARB due to allergy  Labs:  Sodium 141, potassium 2.6, chloride 102, CO2 28, BUN 25, creatinine 1.26 Intake 240 mL Output 2000 mL Weight 80.8 kg  Initial meeting with patient she was sitting up in the chair at bedside remains on oxygen per nasal cannula in no acute distress.  She states that she has a grandson and his wife and child that live with her.  She states that she is alone during the day while they are at work.  She states she had remained short of breath since her last discharge which was July 24, 2021.  Discussed what heart failure is, in her own words she felt it meant that her heart was failing, not meeting the demands of her body.  Discussed the importance of fluid restriction, low-sodium intake, daily weights.  Went over what to report.  She states she had a scale at one but when her bedroom was painted she lost track of it.  She was given a scale from the heart failure clinic.  Also made aware that she is a [facility] patient and qualifies for the ventricle health program.,  She states that she is very interested in signing up for this program, she was giving a brochure to show her family about this program  and I told her when I visited with her tomorrow that we would sign her up for the program if she still was in agreement.  Discussed follow-up in the outpatient heart failure clinic for which she has an appointment on July 11 at 1030.  She has a 5% no-show which is 14 out of 256 appointments.  She was given living with heart failure teaching booklet, zone magnet, info on low-sodium and heart failure along with a weight chart.  Spent over 30 minutes in talking with patient.  Pricilla Riffle RN [facility]

## 2021-07-30 NOTE — Progress Notes (Signed)
The pt scored an 8 on the RT protocol assessment. The Q4 nebulizer treatments will be changed to TID. She is aware of the decrease in treatments and agrees with it. She states she take nebulizers BID at home.

## 2021-07-30 NOTE — Progress Notes (Signed)
Physical Therapy Treatment Patient Details Name: Kerri Carter MRN: [id number] DOB: 20-Feb-1940 Today's Date: 07/30/2021   History of Present Illness Kerri Carter is a 36yoF who comes to [facility] after conitnued SOB progression s/p admission last week, she has been on continuous O2 at home since she left, but typically is on 2L overnight. Pt is typically a household AMB at baseline, but since DC friday has not tolerated any walking, has remained in bedroom due to SOB.    PT Comments    Patient received in recliner, reports she is feeling better than when she came in . Agrees to PT session. She is supervision for sit to stand transfers. Ambulated with RW and min guard/supervision on 2 liters O2 25 feet. Patient doing well and appears to be close to baseline, limited slightly by sob. Patient will continue to benefit from skilled PT while here to improve functional mobility, activity tolerance and independence.      Recommendations for follow up therapy are one component of a multi-disciplinary discharge planning process, led by the attending physician.  Recommendations may be updated based on patient status, additional functional criteria and insurance authorization.  Follow Up Recommendations  Home health PT Can patient physically be transported by private vehicle: Yes   Assistance Recommended at Discharge Intermittent Supervision/Assistance  Patient can return home with the following A little help with bathing/dressing/bathroom;Assistance with cooking/housework;Assist for transportation;Direct supervision/assist for medications management;Help with stairs or ramp for entrance;A little help with walking and/or transfers   Equipment Recommendations  None recommended by PT    Recommendations for Other Services       Precautions / Restrictions Precautions Precautions: Fall Required Braces or Orthoses: Spinal Brace (spinal brace is not here) Spinal Brace: Thoracolumbosacral orthotic;Applied in  sitting position Restrictions Weight Bearing Restrictions: No     Mobility  Bed Mobility               General bed mobility comments: Patient received in recliner and remained in recliner    Transfers Overall transfer level: Modified independent Equipment used: Rolling walker (2 wheels) Transfers: Sit to/from Stand Sit to Stand: Modified independent (Device/Increase time)                Ambulation/Gait Ambulation/Gait assistance: Min guard Gait Distance (Feet): 25 Feet Assistive device: Rolling walker (2 wheels) Gait Pattern/deviations: Step-through pattern Gait velocity: WFL     General Gait Details: patient ambulated well, limited by sob. Ambulated on 2 liters O2 and sats at 94% when returned to recliner. HR up into low 100s with this. Patient requiring only supervision   Stairs             Wheelchair Mobility    Modified Rankin (Stroke Patients Only)       Balance Overall balance assessment: Mild deficits observed, not formally tested                                          Cognition Arousal/Alertness: Awake/alert Behavior During Therapy: WFL for tasks assessed/performed Overall Cognitive Status: Within Functional Limits for tasks assessed                                 General Comments: very sweet and talkative. Very sharp cognitively        Exercises      General Comments  Pertinent Vitals/Pain Pain Assessment Pain Assessment: No/denies pain    Home Living                          Prior Function            PT Goals (current goals can now be found in the care plan section) Acute Rehab PT Goals Patient Stated Goal: to return home PT Goal Formulation: With patient Time For Goal Achievement: 08/12/21 Potential to Achieve Goals: Good Progress towards PT goals: Progressing toward goals    Frequency    Min 2X/week      PT Plan Discharge plan needs to be updated     Co-evaluation              AM-PAC PT "6 Clicks" Mobility   Outcome Measure  Help needed turning from your back to your side while in a flat bed without using bedrails?: None Help needed moving from lying on your back to sitting on the side of a flat bed without using bedrails?: None Help needed moving to and from a bed to a chair (including a wheelchair)?: A Little Help needed standing up from a chair using your arms (e.g., wheelchair or bedside chair)?: A Little Help needed to walk in hospital room?: A Little Help needed climbing 3-5 steps with a railing? : A Little 6 Click Score: 20    End of Session Equipment Utilized During Treatment: Oxygen Activity Tolerance: Patient tolerated treatment well;Other (comment) (limited by SOB but improving) Patient left: in chair;with call bell/phone within reach Nurse Communication: Mobility status PT Visit Diagnosis: Difficulty in walking, not elsewhere classified (R26.2)     Time: 5465-0354 PT Time Calculation (min) (ACUTE ONLY): 22 min  Charges:  $Gait Training: 8-22 mins                     Pecolia Marando, PT, GCS 07/30/21,10:59 AM

## 2021-07-31 ENCOUNTER — Other Ambulatory Visit: Payer: Self-pay | Admitting: Internal Medicine

## 2021-07-31 ENCOUNTER — Inpatient Hospital Stay: Payer: Medicare PPO

## 2021-07-31 DIAGNOSIS — I5043 Acute on chronic combined systolic (congestive) and diastolic (congestive) heart failure: Secondary | ICD-10-CM | POA: Diagnosis not present

## 2021-07-31 DIAGNOSIS — E785 Hyperlipidemia, unspecified: Secondary | ICD-10-CM

## 2021-07-31 LAB — CBC
HCT: 31.1 % — ABNORMAL LOW (ref 36.0–46.0)
Hemoglobin: 9.4 g/dL — ABNORMAL LOW (ref 12.0–15.0)
MCH: 24.2 pg — ABNORMAL LOW (ref 26.0–34.0)
MCHC: 30.2 g/dL (ref 30.0–36.0)
MCV: 80.2 fL (ref 80.0–100.0)
Platelets: 324 10*3/uL (ref 150–400)
RBC: 3.88 MIL/uL (ref 3.87–5.11)
RDW: 16.4 % — ABNORMAL HIGH (ref 11.5–15.5)
WBC: 13.9 10*3/uL — ABNORMAL HIGH (ref 4.0–10.5)
nRBC: 0 % (ref 0.0–0.2)

## 2021-07-31 LAB — BASIC METABOLIC PANEL
Anion gap: 9 (ref 5–15)
BUN: 25 mg/dL — ABNORMAL HIGH (ref 8–23)
CO2: 31 mmol/L (ref 22–32)
Calcium: 8.9 mg/dL (ref 8.9–10.3)
Chloride: 101 mmol/L (ref 98–111)
Creatinine, Ser: 1.27 mg/dL — ABNORMAL HIGH (ref 0.44–1.00)
GFR, Estimated: 43 mL/min — ABNORMAL LOW (ref >60)
Glucose, Bld: 100 mg/dL — ABNORMAL HIGH (ref 70–99)
Potassium: 3.8 mmol/L (ref 3.5–5.1)
Sodium: 141 mmol/L (ref 135–145)

## 2021-07-31 MED ORDER — TORSEMIDE 20 MG PO TABS
20.0000 mg | ORAL_TABLET | Freq: Every day | ORAL | 0 refills | Status: DC
Start: 2021-08-01 — End: 2021-09-12

## 2021-07-31 MED ORDER — APIXABAN 5 MG PO TABS: 5.0000 mg | ORAL_TABLET | Freq: Two times a day (BID) | ORAL | 0 refills | Status: DC

## 2021-07-31 NOTE — Progress Notes (Signed)
Physical Therapy Treatment Patient Details Name: OTILLIA CORDONE MRN: [id number] DOB: 05/25/1940 Today's Date: 07/31/2021   History of Present Illness Mckinna Demars is a 54yoF who comes to [facility] after conitnued SOB progression s/p admission last week, she has been on continuous O2 at home since she left, but typically is on 2L overnight. Pt is typically a household AMB at baseline, but since onset has not tolerated any walking, has remained in bedroom due to SOB. PMH includes CKD, lumbar fusion, COPD, respiratory failure, HTN, GERD, PAD. MD assessment include sAcute on chronic combined systolic (congestive) and diastolic (congestive) heart failure    PT Comments    Pt was pleasant and motivated to participate during the session and put forth good effort throughout. Pt is able to complete bed mobility w/ supervision. Pt able to complete sit to stands w/ supervision with good functional BLE strength. Pt was able to ambulate in hallway 41f x1 and 478fx1 using RW w/ CGA with slow but steady gait and no LOB; sitting rest break needed between walks. Observable increases in RR with SpO2 WNL on room air throughout. Pt reports she is still limited by some SOB but more improved compared to her admission. Pt will benefit from HHPT upon discharge to safely address deficits listed in patient problem list for decreased caregiver assistance and eventual return to PLOF.    Recommendations for follow up therapy are one component of a multi-disciplinary discharge planning process, led by the attending physician.  Recommendations may be updated based on patient status, additional functional criteria and insurance authorization.  Follow Up Recommendations  Home health PT Can patient physically be transported by private vehicle: Yes   Assistance Recommended at Discharge Intermittent Supervision/Assistance  Patient can return home with the following A little help with bathing/dressing/bathroom;Assistance with  cooking/housework;Assist for transportation;Direct supervision/assist for medications management;Help with stairs or ramp for entrance;A little help with walking and/or transfers   Equipment Recommendations  None recommended by PT    Recommendations for Other Services       Precautions / Restrictions Precautions Precautions: Fall;Back;Shoulder Type of Shoulder Precautions: Per imaging 6/29 failed ORIF of L shoulder, awaiting MD guidance Precaution Comments: hx of lumbar fusion 05/23/21 Restrictions Weight Bearing Restrictions: No Other Position/Activity Restrictions: Per Dr YaIzora RibasLSO is not required when mobilizing pt     Mobility  Bed Mobility Overal bed mobility: Needs Assistance Bed Mobility: Supine to Sit     Supine to sit: Supervision, HOB elevated     General bed mobility comments: extra time and effort to complete    Transfers Overall transfer level: Needs assistance Equipment used: Rolling walker (2 wheels) Transfers: Sit to/from Stand Sit to Stand: Supervision                Ambulation/Gait Ambulation/Gait assistance: Min guard Gait Distance (Feet): 30 Feet x1; 40 Feet x1 Assistive device: Rolling walker (2 wheels) Gait Pattern/deviations: Step-through pattern, Decreased step length - right, Decreased step length - left Gait velocity: decreased     General Gait Details: slow but steady gait w/ no LOB   Stairs             Wheelchair Mobility    Modified Rankin (Stroke Patients Only)       Balance Overall balance assessment: Needs assistance Sitting-balance support: Feet supported Sitting balance-Leahy Scale: Good     Standing balance support: Bilateral upper extremity supported, During functional activity Standing balance-Leahy Scale: Fair  Cognition Arousal/Alertness: Awake/alert Behavior During Therapy: WFL for tasks assessed/performed Overall Cognitive Status: Within Functional  Limits for tasks assessed                                          Exercises General Exercises - Lower Extremity Ankle Circles/Pumps: Strengthening, Both, 10 reps Quad Sets: Strengthening, Both, 10 reps Gluteal Sets: Strengthening, Both, 10 reps Long Arc Quad: Strengthening, Both, 10 reps Hip Flexion/Marching: Seated, Strengthening, Both, 10 reps    General Comments        Pertinent Vitals/Pain Pain Assessment Pain Assessment: 0-10 Pain Score: 5  Pain Location: L shoulder Pain Descriptors / Indicators: Aching, Grimacing Pain Intervention(s): Monitored during session, Repositioned    Home Living                          Prior Function            PT Goals (current goals can now be found in the care plan section) Progress towards PT goals: Progressing toward goals    Frequency    Min 2X/week      PT Plan Current plan remains appropriate    Co-evaluation              AM-PAC PT "6 Clicks" Mobility   Outcome Measure  Help needed turning from your back to your side while in a flat bed without using bedrails?: None Help needed moving from lying on your back to sitting on the side of a flat bed without using bedrails?: None Help needed moving to and from a bed to a chair (including a wheelchair)?: A Little Help needed standing up from a chair using your arms (e.g., wheelchair or bedside chair)?: A Little Help needed to walk in hospital room?: A Little Help needed climbing 3-5 steps with a railing? : A Little 6 Click Score: 20    End of Session Equipment Utilized During Treatment: Gait belt Activity Tolerance: Patient tolerated treatment well Patient left: in chair;with call bell/phone within reach;with chair alarm set Nurse Communication: Mobility status PT Visit Diagnosis: Difficulty in walking, not elsewhere classified (R26.2);Muscle weakness (generalized) (M62.81)     Time: 4196-2229 PT Time Calculation (min) (ACUTE ONLY):  25 min  Charges:                        Turner Daniels, SPT  07/31/2021, 1:19 PM

## 2021-07-31 NOTE — Discharge Summary (Signed)
Physician Discharge Summary  Kerri Carter FKC:[id number] DOB: 1940/05/22 DOA: 07/28/2021  PCP: McLean-Scocuzza, Nino Glow, MD  Admit date: 07/28/2021 Discharge date: 07/31/2021  Admitted From: Home Disposition: Home with home health  Recommendations for Outpatient Follow-up:  Follow up with PCP in 1-2 weeks Follow-up outpatient cardiology  Home Health: Yes PT OT aide Equipment/Devices: Oxygen 2 L nasal cannula  Discharge Condition: Stable CODE STATUS: DNR Diet recommendation: Heart healthy  Brief/Interim Summary: 81 year old female history of anxiety with depression, CKD 3A, status post lumbar fusion, COPD, chronic hypoxic respiratory failure requiring 2 L nasal cannula nightly, bipolar disorder, hypertension, GERD, PAD, peptic ulcer disease, L3-L4 disc herniation with severe stenosis, who presents emergency department for chief concerns of shortness of breath.She was recently hospitalized 6/20-6/22/23 for LLL infiltrate treated with Rocpehin and Azithromycin, discharged on Augmentin along with prednisone 40 mg daily. At discharge she was able to ambulate.   Over the past 36 hrs she has had increased SOB. She denies fevers, chills, productive cough, chest pain. EMS was initiated and on arrival found the patient to be hypoxemic. She was administered nebulizer treatments and started on BiPAP wile being transported to [facility]   On day of discharge symptoms have improved.  Respiratory status back to baseline.  On 2 L.  Suspected etiology decompensated diastolic congestive heart failure.  Switch to torsemide 20 mg daily per cardiology.  Will discharge on this dose.  Follow-up outpatient cardiology.  Currently referral to advanced heart failure [facility] is in the works.  Patient complains of left shoulder pain.  Patient has a history of failed ORIF of her shoulder.  We did pursue x-ray imaging survey.  I discussed the findings with the patient's orthopedic surgeon who states no surgical intervention is  necessary.  Recommend conservative management, therapy, pain control.  Discussed findings with patient's and patient's son.  Explained our recommendations.  They are in agreement.    Discharge Diagnoses:  Principal Problem:   Acute on chronic combined systolic (congestive) and diastolic (congestive) heart failure (HCC) Active Problems:   Chronic obstructive pulmonary disease (COPD) (HCC)   Mixed bipolar I disorder (HCC)   Acute respiratory failure with hypoxia (HCC)   Acute on chronic combined systolic and diastolic CHF (congestive heart failure) (HCC)   OSA (obstructive sleep apnea)   Hyperlipidemia   Anemia   Essential hypertension   Atrial fibrillation (HCC)  Dyspnea Acute on chronic diastolic CHF Mitral regurgitation Suspect multifactorial with psychiatric component.  Presents with DOE. EMS found her to be hypoxemic and was placed on bipap.  Quickly weaned off that and here breathing comfortably speaking in complete sentences on 3L currently (2 L prn at home).  No cough or fevers to suggest infectious process.  Bnp chronically elevated and elevated on presentation.  TTE from 2 months ago showed preserved EF, grade 2 diastolic dysfunction with severe mitral regurg.  Low suspicion for infectious process Suspect mild decompensated HF Responded well to Lasix 40 mg IV x2 doses However creatinine worsened subsequently Diuresis held creatinine improved Plan: Discharge home.  Start torsemide 20 mg daily.  Stop previous home furosemide.  Follow-up outpatient cardiology.   Acute respiratory failure on chronic respiratory failure On 2 L prn at home, mostly at night. Presented with dyspnea on bipap, quickly weaned off.  Now back on baseline 2 L and breathing comfortably -Patient stable on baseline 2 L oxygen at time of discharge   Paroxysmal atrial fibrillation Here sinus rhythm. Recent diagnosis, wasn't started on anticoagulation -  cont BB -Started on Eliquis 6/27, course  prescribed on discharge   Recent lumbar surgery Lumbar fusion and arthrodesis performed 05/2021. Appears stable.   Acute kidney injury Cr 1.28 on presentation, baseline is around 1. Received lasix 40 iv x2, creatinine subsequently worsened.  Diuretics held on 6/27  Plan:  Torsemide per cardiology recommendations  Outpatient BMP   Debility Evaluated by PT last hospitalization, rec was home with home health.  Patient feels she wasn't strong enough to go home though she also says she won't go to skilled nursing. -Latest PT recommendations for home with home health   COPD Recently treated for exacerbation with antibiotics. Completed steroid burst yesteday -Continue prednisone taper, last dose 6/29 - home budesonide, singulair  Failed left shoulder ORIF Patient complained of pain in left shoulder.  Exam revealed displaced hardware.  Follow-up x-ray revealed the same.  Discussed with orthopedic surgery.  No surgical intervention warranted.  Recommend conservative management, pain control, therapy  Discharge Instructions  Discharge Instructions     Diet - low sodium heart healthy   Complete by: As directed    Increase activity slowly   Complete by: As directed       Allergies as of 07/31/2021       Reactions   Ceftin [cefuroxime Axetil] Anaphylaxis   Lisinopril Anaphylaxis   Sulfa Antibiotics Other (See Comments)   Face swelling   Sulfasalazine Anaphylaxis   Morphine Other (See Comments)   Per patient, low blood pressure issues that requires action to raise it back up. Can take small infrequent doses   Xarelto [rivaroxaban] Other (See Comments)   Stomach burning, bleeding, and tar in stool   Adhesive [tape] Rash   Paper tape and tega derm OK   Antihistamines, Chlorpheniramine-type Other (See Comments)   Makes pt hyper   Antivert [meclizine Hcl] Other (See Comments)   Bladder will not empty   Aspirin Other (See Comments)   Sulfasalazine allergy cross reacts   Contrast  Media [iodinated Contrast Media] Rash   she is able to use betadine scrubs.   Decongestant [pseudoephedrine Hcl] Other (See Comments)   Makes pt hyper   Doxycycline Other (See Comments)   GI upset   Levaquin [levofloxacin In D5w] Rash   Polymyxin B Other (See Comments)   Facial rash   Tetanus Toxoids Rash, Other (See Comments)   Fever and hot to touch at injection site        Medication List     STOP taking these medications    amoxicillin-clavulanate 875-125 MG tablet Commonly known as: AUGMENTIN   clopidogrel 75 MG tablet Commonly known as: PLAVIX   furosemide 40 MG tablet Commonly known as: LASIX   predniSONE 20 MG tablet Commonly known as: DELTASONE       TAKE these medications    Adalimumab 40 MG/0.4ML Pnkt Inject 40 mg into the skin every 14 (fourteen) days.   albuterol 108 (90 Base) MCG/ACT inhaler Commonly known as: Ventolin HFA Inhale 2 puffs into the lungs every 6 (six) hours as needed for wheezing or shortness of breath.   amLODipine 5 MG tablet Commonly known as: NORVASC Take 5 mg by mouth daily.   apixaban 5 MG Tabs tablet Commonly known as: ELIQUIS Take 1 tablet (5 mg total) by mouth 2 (two) times daily.   budesonide 0.25 MG/2ML nebulizer solution Commonly known as: PULMICORT USE 1 VIAL  IN  NEBULIZER TWICE  DAILY - rinse mouth after treatment   busPIRone 5 MG tablet Commonly known  as: BUSPAR Take 1 tablet (5 mg total) by mouth 2 (two) times daily.   cyclobenzaprine 10 MG tablet Commonly known as: FLEXERIL Take 1 tablet (10 mg total) by mouth 3 (three) times daily as needed for muscle spasms.   dicyclomine 10 MG capsule Commonly known as: BENTYL Take 1 capsule (10 mg total) by mouth 2 (two) times daily as needed for spasms. What changed:  how much to take when to take this   escitalopram 10 MG tablet Commonly known as: LEXAPRO TAKE 1 TABLET BY MOUTH EVERY DAY   etodolac 500 MG tablet Commonly known as: LODINE Take 500 mg by  mouth daily.   famotidine 20 MG tablet Commonly known as: PEPCID TAKE 1 TABLET (20 MG TOTAL) BY MOUTH DAILY. BEFORE BREAKFAST OR DINNER   fluticasone 50 MCG/ACT nasal spray Commonly known as: Flonase Place 2 sprays into both nostrils daily. In am   gabapentin 300 MG capsule Commonly known as: NEURONTIN Take 2 capsules (600 mg total) by mouth in the morning and at bedtime.   ipratropium-albuterol 0.5-2.5 (3) MG/3ML Soln Commonly known as: DUONEB Take 3 mLs by nebulization 3 (three) times daily as needed.   lamoTRIgine 100 MG tablet Commonly known as: LAMICTAL TAKE 1 TAB 2 TIMES DAILY. FURTHER REFILLS NEW PSYCH FOR ALL PSYCH MEDS ONLY TEMP SUPPLY FROM PCP   leflunomide 20 MG tablet Commonly known as: ARAVA Take 1 tablet (20 mg total) by mouth daily.   lidocaine 5 % Commonly known as: Lidoderm Place 1 patch onto the skin 2 (two) times daily as needed. Remove & Discard patch within 12 hours or as directed by MD What changed: when to take this   lovastatin 20 MG tablet Commonly known as: MEVACOR TAKE 1 TABLET BY MOUTH EVERYDAY AT BEDTIME *STOP TALKING '40MG'$ * What changed: See the new instructions.   methocarbamol 500 MG tablet Commonly known as: ROBAXIN Take 500 mg by mouth 4 (four) times daily as needed.   mirabegron ER 50 MG Tb24 tablet Commonly known as: Myrbetriq Take 1 tablet (50 mg total) by mouth daily.   montelukast 10 MG tablet Commonly known as: SINGULAIR TAKE 1 TABLET BY MOUTH EVERY DAY   multivitamin-lutein Caps capsule Take 1 capsule by mouth at bedtime.   nebivolol 10 MG tablet Commonly known as: Bystolic Take 0.5 tablets (5 mg total) by mouth in the morning and at bedtime.   oxyCODONE-acetaminophen 5-325 MG tablet Commonly known as: Percocet Take 1 tablet by mouth every 4 (four) hours as needed for severe pain.   OXYGEN Inhale 2 L into the lungs at bedtime.   pantoprazole 40 MG tablet Commonly known as: PROTONIX TAKE 1 TABLET BY MOUTH 2 TIMES  DAILY 30 MIN BEFORE FOOD (NOTE REDUCTION IN FREQUENCY)   QUEtiapine 25 MG tablet Commonly known as: SEROQUEL TAKE 1 TABLET (25 MG TOTAL) BY MOUTH AT BEDTIME. AGAIN LAST FILL FURTHER REFILLS FROM PSYCHIATRY NO EXCEPTIONS   sucralfate 1 g tablet Commonly known as: CARAFATE Take 1 tablet (1 g total) by mouth 2 (two) times daily.   torsemide 20 MG tablet Commonly known as: DEMADEX Take 1 tablet (20 mg total) by mouth daily. Start taking on: August 01, 2021   Vitamin D3 50 MCG (2000 UT) Tabs Take 4,000 Units by mouth daily.   zoledronic acid 5 MG/100ML Soln injection Commonly known as: RECLAST Inject 5 mg into the vein once.        Follow-up Information     Povsic, Marcello Moores, MD. Go in  1 week(s).   Specialty: Internal Medicine Contact information: [place] [address] [phone]                Allergies  Allergen Reactions   Ceftin [Cefuroxime Axetil] Anaphylaxis   Lisinopril Anaphylaxis   Sulfa Antibiotics Other (See Comments)    Face swelling   Sulfasalazine Anaphylaxis   Morphine Other (See Comments)    Per patient, low blood pressure issues that requires action to raise it back up. Can take small infrequent doses   Xarelto [Rivaroxaban] Other (See Comments)    Stomach burning, bleeding, and tar in stool   Adhesive [Tape] Rash    Paper tape and tega derm OK   Antihistamines, Chlorpheniramine-Type Other (See Comments)    Makes pt hyper   Antivert [Meclizine Hcl] Other (See Comments)    Bladder will not empty   Aspirin Other (See Comments)    Sulfasalazine allergy cross reacts   Contrast Media [Iodinated Contrast Media] Rash    she is able to use betadine scrubs.   Decongestant [Pseudoephedrine Hcl] Other (See Comments)    Makes pt hyper   Doxycycline Other (See Comments)    GI upset   Levaquin [Levofloxacin In D5w] Rash   Polymyxin B Other (See Comments)    Facial rash   Tetanus Toxoids Rash and Other (See Comments)    Fever and hot  to touch at injection site    Consultations: Cardiology-[facility]   Procedures/Studies: DG Shoulder Left  Result Date: 07/31/2021 CLINICAL DATA:  Left shoulder hump. Multiple prior left shoulder surgeries. EXAM: LEFT SHOULDER - 2+ VIEW; LEFT HUMERUS - 2+ VIEW COMPARISON:  Left shoulder x-rays dated December 21, 2019. FINDINGS: Prior reverse total shoulder arthroplasty. No evidence of hardware failure or loosening. Prior ORIF of the acromion with displacement of the distal acromion fragment from the hardware, new since postoperative x-rays from November 2021. Hardware approximates the skin surface. Heterotopic ossification posterior to the arthroplasty. The mid to distal humerus and elbow are unremarkable. IMPRESSION: 1. Failed ORIF of the acromion with displacement of the distal acromion fragment from the hardware, new since postoperative x-rays from December 21, 2019. 2. Prior reverse total shoulder arthroplasty without hardware complication. Electronically Signed   By: Titus Dubin M.D.   On: 07/31/2021 11:09   DG Humerus Left  Result Date: 07/31/2021 CLINICAL DATA:  Left shoulder hump. Multiple prior left shoulder surgeries. EXAM: LEFT SHOULDER - 2+ VIEW; LEFT HUMERUS - 2+ VIEW COMPARISON:  Left shoulder x-rays dated December 21, 2019. FINDINGS: Prior reverse total shoulder arthroplasty. No evidence of hardware failure or loosening. Prior ORIF of the acromion with displacement of the distal acromion fragment from the hardware, new since postoperative x-rays from November 2021. Hardware approximates the skin surface. Heterotopic ossification posterior to the arthroplasty. The mid to distal humerus and elbow are unremarkable. IMPRESSION: 1. Failed ORIF of the acromion with displacement of the distal acromion fragment from the hardware, new since postoperative x-rays from December 21, 2019. 2. Prior reverse total shoulder arthroplasty without hardware complication. Electronically Signed   By:  Titus Dubin M.D.   On: 07/31/2021 11:09   ECHOCARDIOGRAM COMPLETE  Result Date: 07/30/2021    ECHOCARDIOGRAM REPORT   Patient Name:   Kerri Carter Date of Exam: 07/29/2021 Medical Rec #:  [id number]     Height:       61.5 in Accession #:    [id number]    Weight:       173.1  lb Date of Birth:  1940-06-03     BSA:          1.787 m Patient Age:    47 years      BP:           130/52 mmHg Patient Gender: F             HR:           86 bpm. Exam Location:  [facility] Procedure: 2D Echo, Color Doppler and Cardiac Doppler Indications:     Mitral valve Insufficiency I34.0  History:         Patient has prior history of Echocardiogram examinations, most                  recent 05/26/2021. COPD; Risk Factors:Hypertension. Aortic                  stenosis.  Sonographer:     Sherrie Sport Referring Phys:  [id number] Soham TANG Diagnosing Phys: Isaias Cowman MD  Sonographer Comments: No parasternal window, no subcostal window and Technically challenging study due to limited acoustic windows. Image acquisition challenging due to COPD. IMPRESSIONS  1. Left ventricular ejection fraction, by estimation, is 55 to 60%. The left ventricle has normal function. The left ventricle has no regional wall motion abnormalities. Left ventricular diastolic parameters are consistent with Grade I diastolic dysfunction (impaired relaxation).  2. Right ventricular systolic function is normal. The right ventricular size is normal.  3. The mitral valve is normal in structure. Moderate to severe mitral valve regurgitation. No evidence of mitral stenosis.  4. The aortic valve is normal in structure. Aortic valve regurgitation is moderate. Mild to moderate aortic valve stenosis.  5. The inferior vena cava is normal in size with greater than 50% respiratory variability, suggesting right atrial pressure of 3 mmHg. FINDINGS  Left Ventricle: Left ventricular ejection fraction, by estimation, is 55 to 60%. The left ventricle has normal function. The  left ventricle has no regional wall motion abnormalities. The left ventricular internal cavity size was normal in size. There is  no left ventricular hypertrophy. Left ventricular diastolic parameters are consistent with Grade I diastolic dysfunction (impaired relaxation). Right Ventricle: The right ventricular size is normal. No increase in right ventricular wall thickness. Right ventricular systolic function is normal. Left Atrium: Left atrial size was normal in size. Right Atrium: Right atrial size was normal in size. Pericardium: There is no evidence of pericardial effusion. Mitral Valve: The mitral valve is normal in structure. There is moderate thickening of the mitral valve leaflet(s). Moderate to severe mitral valve regurgitation. No evidence of mitral valve stenosis. MV peak gradient, 7.6 mmHg. The mean mitral valve gradient is 4.0 mmHg. Tricuspid Valve: The tricuspid valve is normal in structure. Tricuspid valve regurgitation is mild . No evidence of tricuspid stenosis. Aortic Valve: The aortic valve is normal in structure. Aortic valve regurgitation is moderate. Mild to moderate aortic stenosis is present. Aortic valve mean gradient measures 17.0 mmHg. Aortic valve peak gradient measures 29.4 mmHg. Aortic valve area, by VTI measures 1.37 cm. Pulmonic Valve: The pulmonic valve was normal in structure. Pulmonic valve regurgitation is not visualized. No evidence of pulmonic stenosis. Aorta: The aortic root is normal in size and structure. Venous: The inferior vena cava is normal in size with greater than 50% respiratory variability, suggesting right atrial pressure of 3 mmHg. IAS/Shunts: No atrial level shunt detected by color flow Doppler.  LEFT VENTRICLE PLAX 2D LVIDd:  4.30 cm   Diastology LVIDs:         3.10 cm   LV e' medial:    3.92 cm/s LV PW:         1.30 cm   LV E/e' medial:  26.3 LV IVS:        1.00 cm   LV e' lateral:   3.15 cm/s LVOT diam:     2.00 cm   LV E/e' lateral: 32.7 LV SV:          70 LV SV Index:   39 LVOT Area:     3.14 cm  RIGHT VENTRICLE RV Basal diam:  2.90 cm RV S prime:     10.70 cm/s TAPSE (M-mode): 2.2 cm LEFT ATRIUM              Index        RIGHT ATRIUM           Index LA diam:        4.50 cm  2.52 cm/m   RA Area:     13.30 cm LA Vol (A2C):   56.5 ml  31.62 ml/m  RA Volume:   30.50 ml  17.07 ml/m LA Vol (A4C):   119.0 ml 66.59 ml/m LA Biplane Vol: 86.4 ml  48.35 ml/m  AORTIC VALVE AV Area (Vmax):    1.23 cm AV Area (Vmean):   1.28 cm AV Area (VTI):     1.37 cm AV Vmax:           271.33 cm/s AV Vmean:          189.000 cm/s AV VTI:            0.515 m AV Peak Grad:      29.4 mmHg AV Mean Grad:      17.0 mmHg LVOT Vmax:         106.00 cm/s LVOT Vmean:        77.200 cm/s LVOT VTI:          0.224 m LVOT/AV VTI ratio: 0.43  AORTA Ao Root diam: 1.80 cm MITRAL VALVE                TRICUSPID VALVE MV Area (PHT): 2.93 cm     TR Peak grad:   20.4 mmHg MV Area VTI:   1.55 cm     TR Vmax:        226.00 cm/s MV Peak grad:  7.6 mmHg MV Mean grad:  4.0 mmHg     SHUNTS MV Vmax:       1.38 m/s     Systemic VTI:  0.22 m MV Vmean:      90.4 cm/s    Systemic Diam: 2.00 cm MV Decel Time: 259 msec MV E velocity: 103.00 cm/s MV A velocity: 120.00 cm/s MV E/A ratio:  0.86 Isaias Cowman MD Electronically signed by Isaias Cowman MD Signature Date/Time: 07/30/2021/12:19:57 PM    Final    DG Chest Portable 1 View  Result Date: 07/28/2021 CLINICAL DATA:  Shortness of breath.  History of COPD. EXAM: PORTABLE CHEST 1 VIEW COMPARISON:  Radiographs 07/22/2021 and 05/29/2021.  CT 07/22/2021. FINDINGS: 1322 hours. Lordotic positioning. The heart size and mediastinal contours are stable. Interval increased ill-defined density in the right upper lobe which could reflect an early infiltrate. The left lung appears clear. There is no evidence of pleural effusion or pneumothorax. Patient is status post bilateral shoulder reverse arthroplasty, and there are several old right-sided rib fractures.  No  acute osseous findings are evident. Telemetry leads overlie the chest. IMPRESSION: Possible new ill-defined infiltrate or atelectasis in the right upper lobe. Electronically Signed   By: Richardean Sale M.D.   On: 07/28/2021 13:36   CT Chest Wo Contrast  Result Date: 07/22/2021 CLINICAL DATA:  Respiratory illness cough with leg swelling EXAM: CT CHEST WITHOUT CONTRAST TECHNIQUE: Multidetector CT imaging of the chest was performed following the standard protocol without IV contrast. RADIATION DOSE REDUCTION: This exam was performed according to the departmental dose-optimization program which includes automated exposure control, adjustment of the mA and/or kV according to patient size and/or use of iterative reconstruction technique. COMPARISON:  Chest x-ray 07/22/2021, chest CT 03/21/2011, 05/01/2020 FINDINGS: Cardiovascular: Limited evaluation without intravenous contrast. Moderate aortic atherosclerosis. No aneurysm. Coronary vascular calcifications. No pericardial effusion. Mitral calcification. Mediastinum/Nodes: Midline trachea. No thyroid mass. No suspicious lymph nodes. Esophagus within normal limits. Lungs/Pleura: No consolidation, pleural effusion or pneumothorax. Mild lower lobe bronchial wall thickening with scattered mucous plugging. Mild tree-in-bud nodularity in the left lower lobe, series 3 image 79. Upper Abdomen: No acute abnormality. Musculoskeletal: No acute osseous abnormality. Previously noted nodular opacity may correspond to lead attachment on the left upper anterior chest wall. IMPRESSION: 1. No suspicious pulmonary nodule to correspond to the questioned radiographic abnormality. 2. Mild lower lobe bronchial wall thickening with scattered mucous plugging. Small amount of tree-in-bud nodularity in the left lower lobe suggesting respiratory infection. Aortic Atherosclerosis (ICD10-I70.0). Electronically Signed   By: Donavan Foil M.D.   On: 07/22/2021 19:50   DG Chest 2 View  Result  Date: 07/22/2021 CLINICAL DATA:  Cough and swelling in the legs EXAM: CHEST - 2 VIEW COMPARISON:  Chest radiograph 05/29/2021 FINDINGS: The cardiomediastinal silhouette is normal. The lungs hyperinflated with flattening of the diaphragms consistent with underlying COPD. There is no focal consolidation or pulmonary edema. Previously seen opacities in the left lung have significantly improved/resolved. There is no pleural effusion or pneumothorax. There is a 1.3 cm nodular opacity in the left apex not seen on prior studies. Bilateral shoulder arthroplasty hardware, left clavicle surgical fixation, and lumbar spine fusion hardware are noted. Opacities projecting over the right fifth and sixth ribs are consistent with reflect old fractures. IMPRESSION: 1. No radiographic evidence of acute cardiopulmonary process. Previously seen opacities in the left lung on the study from 05/29/2021 have resolved. 2. 1.3 cm nodular opacity in the left apex not seen on prior studies. Recommend nonemergent chest CT to evaluate for a pulmonary nodule. Electronically Signed   By: Valetta Mole M.D.   On: 07/22/2021 18:06   CT HEAD WO CONTRAST (5MM)  Result Date: 07/17/2021 CLINICAL DATA:  Fall EXAM: CT HEAD WITHOUT CONTRAST TECHNIQUE: Contiguous axial images were obtained from the base of the skull through the vertex without intravenous contrast. RADIATION DOSE REDUCTION: This exam was performed according to the departmental dose-optimization program which includes automated exposure control, adjustment of the mA and/or kV according to patient size and/or use of iterative reconstruction technique. COMPARISON:  02/10/2021 FINDINGS: Brain: No evidence of acute infarction, hemorrhage, cerebral edema, mass, mass effect, or midline shift. No extra-axial fluid collection. Unchanged size and configuration of the ventricles, favored to be related to cerebral volume loss, although normal pressure hydrocephalus can appear similar. Periventricular  white matter changes, likely the sequela of chronic small vessel ischemic disease. Vascular: No hyperdense vessel. Atherosclerotic calcifications in the intracranial carotid and vertebral arteries. Skull: Normal. Negative for fracture or focal lesion. Sinuses/Orbits: Mucosal thickening in  the left-greater-than-right ethmoid air cells. Status post bilateral lens replacements. Other: The mastoid air cells are well aerated. IMPRESSION: No acute intracranial process. Electronically Signed   By: Merilyn Baba M.D.   On: 07/17/2021 14:55   DG Humerus Right  Result Date: 07/17/2021 CLINICAL DATA:  Right arm pain after fall. EXAM: RIGHT HUMERUS - 2+ VIEW COMPARISON:  None Available. FINDINGS: Status post right shoulder arthroplasty. Old right rib fractures are noted. No acute fracture or dislocation is noted. IMPRESSION: No acute abnormality seen. Electronically Signed   By: Marijo Conception M.D.   On: 07/17/2021 14:20   DG Shoulder Right  Result Date: 07/17/2021 CLINICAL DATA:  Right arm pain after fall. EXAM: RIGHT SHOULDER - 2+ VIEW COMPARISON:  None Available. FINDINGS: Status post right total shoulder arthroplasty. Glenoid and humeral components are well situated. Old right rib fractures are noted. No acute fracture or dislocation is noted. IMPRESSION: No acute abnormality is noted. Electronically Signed   By: Marijo Conception M.D.   On: 07/17/2021 14:19      Subjective: Seen and examined the day of discharge.  Respiratory status stable.  Volume status optimized.  Stable for discharge home.  Discharge Exam: Vitals:   07/31/21 1000 07/31/21 1150  BP:  134/89  Pulse:  82  Resp: 18 19  Temp:  98.1 F (36.7 C)  SpO2: 94% 93%   Vitals:   07/31/21 0733 07/31/21 0816 07/31/21 1000 07/31/21 1150  BP: (!) 109/55   134/89  Pulse: 68   82  Resp: '18  18 19  '$ Temp: 97.9 F (36.6 C)   98.1 F (36.7 C)  TempSrc: Oral   Oral  SpO2: 98% 95% 94% 93%  Weight:      Height:        General: Pt is alert,  awake, not in acute distress Cardiovascular: RRR, S1/S2 +, no rubs, no gallops Respiratory: CTA bilaterally, no wheezing, no rhonchi Abdominal: Soft, NT, ND, bowel sounds + Extremities: no edema, no cyanosis    The results of significant diagnostics from this hospitalization (including imaging, microbiology, ancillary and laboratory) are listed below for reference.     Microbiology: Recent Results (from the past 240 hour(s))  SARS Coronavirus 2 by RT PCR (hospital order, performed in [facility] hospital lab) *cepheid single result test* Anterior Nasal Swab     Status: None   Collection Time: 07/22/21  6:49 PM   Specimen: Anterior Nasal Swab  Result Value Ref Range Status   SARS Coronavirus 2 by RT PCR NEGATIVE NEGATIVE Final    Comment: (NOTE) SARS-CoV-2 target nucleic acids are NOT DETECTED.  The SARS-CoV-2 RNA is generally detectable in upper and lower respiratory specimens during the acute phase of infection. The lowest concentration of SARS-CoV-2 viral copies this assay can detect is 250 copies / mL. A negative result does not preclude SARS-CoV-2 infection and should not be used as the sole basis for treatment or other patient management decisions.  A negative result may occur with improper specimen collection / handling, submission of specimen other than nasopharyngeal swab, presence of viral mutation(s) within the areas targeted by this assay, and inadequate number of viral copies (<250 copies / mL). A negative result must be combined with clinical observations, patient history, and epidemiological information.  Fact Sheet for Patients:   https://www.patel.info/  Fact Sheet for Healthcare Providers: https://hall.com/  This test is not yet approved or  cleared by the [place] FDA and has been authorized for detection and/or  diagnosis of SARS-CoV-2 by FDA under an Emergency Use Authorization (EUA).  This EUA will remain in  effect (meaning this test can be used) for the duration of the COVID-19 declaration under Section 564(b)(1) of the Act, 21 U.S.C. section 360bbb-3(b)(1), unless the authorization is terminated or revoked sooner.  Performed at [facility], [address] [place], [place] [address]   Blood culture (routine x 2)     Status: None   Collection Time: 07/22/21  7:40 PM   Specimen: BLOOD  Result Value Ref Range Status   Specimen Description BLOOD LEFT ASSIST CONTROL  Final   Special Requests   Final    BOTTLES DRAWN AEROBIC AND ANAEROBIC Blood Culture adequate volume   Culture   Final    NO GROWTH 5 DAYS Performed at [facility], [address], [place], [place] [address]    Report Status 07/27/2021 FINAL  Final  Blood culture (routine x 2)     Status: None   Collection Time: 07/22/21  7:41 PM   Specimen: BLOOD  Result Value Ref Range Status   Specimen Description BLOOD RIGHT HAND  Final   Special Requests   Final    BOTTLES DRAWN AEROBIC AND ANAEROBIC Blood Culture adequate volume   Culture   Final    NO GROWTH 5 DAYS Performed at [facility], [address] [place], [place] [address]    Report Status 07/27/2021 FINAL  Final  Expectorated Sputum Assessment w Gram Stain, Rflx to Resp Cult     Status: None   Collection Time: 07/23/21  8:51 AM   Specimen: Sputum  Result Value Ref Range Status   Specimen Description SPUTUM  Final   Special Requests NONE  Final   Sputum evaluation   Final    THIS SPECIMEN IS ACCEPTABLE FOR SPUTUM CULTURE Performed at [facility], [address] [place], [place] [address]    Report Status 07/23/2021 FINAL  Final  Culture, Respiratory w Gram Stain     Status: None   Collection Time: 07/23/21  8:51 AM   Specimen: SPU  Result Value Ref Range Status   Specimen Description   Final    SPUTUM Performed at [facility], [address], [place], [place] [address]    Special Requests   Final     NONE Reflexed from [id number] Performed at [facility], [address] [place], [place] [address]    Gram Stain NO WBC SEEN RARE BUDDING YEAST SEEN   Final   Culture   Final    FEW Normal respiratory flora-no Staph aureus or Pseudomonas seen Performed at [facility], [place] [address], [place], [place] [address]    Report Status 07/26/2021 FINAL  Final  Respiratory (~20 pathogens) panel by PCR     Status: None   Collection Time: 07/23/21 12:07 PM   Specimen: Nasopharyngeal Swab; Respiratory  Result Value Ref Range Status   Adenovirus NOT DETECTED NOT DETECTED Final   Coronavirus 229E NOT DETECTED NOT DETECTED Final    Comment: (NOTE) The Coronavirus on the Respiratory Panel, DOES NOT test for the novel  Coronavirus (2019 nCoV)    Coronavirus HKU1 NOT DETECTED NOT DETECTED Final   Coronavirus NL63 NOT DETECTED NOT DETECTED Final   Coronavirus OC43 NOT DETECTED NOT DETECTED Final   Metapneumovirus NOT DETECTED NOT DETECTED Final   Rhinovirus / Enterovirus NOT DETECTED NOT DETECTED Final   Influenza A NOT DETECTED NOT DETECTED Final   Influenza  B NOT DETECTED NOT DETECTED Final   Parainfluenza Virus 1 NOT DETECTED NOT DETECTED Final   Parainfluenza Virus 2 NOT DETECTED NOT DETECTED Final   Parainfluenza Virus 3 NOT DETECTED NOT DETECTED Final   Parainfluenza Virus 4 NOT DETECTED NOT DETECTED Final   Respiratory Syncytial Virus NOT DETECTED NOT DETECTED Final   Bordetella pertussis NOT DETECTED NOT DETECTED Final   Bordetella Parapertussis NOT DETECTED NOT DETECTED Final   Chlamydophila pneumoniae NOT DETECTED NOT DETECTED Final   Mycoplasma pneumoniae NOT DETECTED NOT DETECTED Final    Comment: Performed at [facility], [place] [address] [place], [place] [address]  Resp Panel by RT-PCR (Flu A&B, Covid) Anterior Nasal Swab     Status: None   Collection Time: 07/28/21  1:19 PM   Specimen: Anterior Nasal Swab  Result Value Ref Range Status   SARS Coronavirus 2 by  RT PCR NEGATIVE NEGATIVE Final    Comment: (NOTE) SARS-CoV-2 target nucleic acids are NOT DETECTED.  The SARS-CoV-2 RNA is generally detectable in upper respiratory specimens during the acute phase of infection. The lowest concentration of SARS-CoV-2 viral copies this assay can detect is 138 copies/mL. A negative result does not preclude SARS-Cov-2 infection and should not be used as the sole basis for treatment or other patient management decisions. A negative result may occur with  improper specimen collection/handling, submission of specimen other than nasopharyngeal swab, presence of viral mutation(s) within the areas targeted by this assay, and inadequate number of viral copies(<138 copies/mL). A negative result must be combined with clinical observations, patient history, and epidemiological information. The expected result is Negative.  Fact Sheet for Patients:  EntrepreneurPulse.com.au  Fact Sheet for Healthcare Providers:  IncredibleEmployment.be  This test is no t yet approved or cleared by the [place] FDA and  has been authorized for detection and/or diagnosis of SARS-CoV-2 by FDA under an Emergency Use Authorization (EUA). This EUA will remain  in effect (meaning this test can be used) for the duration of the COVID-19 declaration under Section 564(b)(1) of the Act, 21 U.S.C.section 360bbb-3(b)(1), unless the authorization is terminated  or revoked sooner.       Influenza A by PCR NEGATIVE NEGATIVE Final   Influenza B by PCR NEGATIVE NEGATIVE Final    Comment: (NOTE) The Xpert Xpress SARS-CoV-2/FLU/RSV plus assay is intended as an aid in the diagnosis of influenza from Nasopharyngeal swab specimens and should not be used as a sole basis for treatment. Nasal washings and aspirates are unacceptable for Xpert Xpress SARS-CoV-2/FLU/RSV testing.  Fact Sheet for Patients: EntrepreneurPulse.com.au  Fact Sheet  for Healthcare Providers: IncredibleEmployment.be  This test is not yet approved or cleared by the [place] FDA and has been authorized for detection and/or diagnosis of SARS-CoV-2 by FDA under an Emergency Use Authorization (EUA). This EUA will remain in effect (meaning this test can be used) for the duration of the COVID-19 declaration under Section 564(b)(1) of the Act, 21 U.S.C. section 360bbb-3(b)(1), unless the authorization is terminated or revoked.  Performed at [facility], [address] [place], [place] [address]      Labs: BNP (last 3 results) Recent Labs    07/22/21 1737 07/28/21 1319 07/29/21 0551  BNP 334.3* 670.5* 382.5*   Basic Metabolic Panel: Recent Labs  Lab 07/29/21 0550 07/29/21 1529 07/30/21 0444 07/31/21 0605  NA 139 138 141 141  K 3.5 3.8 3.6 3.8  CL 103 99 102 101  CO2 '27 27 28 31  '$ GLUCOSE 90  206* 94 100*  BUN 25* 28* 25* 25*  CREATININE 1.28* 1.60* 1.26* 1.27*  CALCIUM 8.1* 8.3* 8.5* 8.9   Liver Function Tests: No results for input(s): "AST", "ALT", "ALKPHOS", "BILITOT", "PROT", "ALBUMIN" in the last 168 hours. No results for input(s): "LIPASE", "AMYLASE" in the last 168 hours. No results for input(s): "AMMONIA" in the last 168 hours. CBC: Recent Labs  Lab 07/28/21 1319 07/31/21 0605  WBC 15.1* 13.9*  NEUTROABS 11.7*  --   HGB 9.4* 9.4*  HCT 31.0* 31.1*  MCV 80.3 80.2  PLT 379 324   Cardiac Enzymes: No results for input(s): "CKTOTAL", "CKMB", "CKMBINDEX", "TROPONINI" in the last 168 hours. BNP: Invalid input(s): "POCBNP" CBG: No results for input(s): "GLUCAP" in the last 168 hours. D-Dimer Recent Labs    07/29/21 0815  DDIMER 1.38*   Hgb A1c No results for input(s): "HGBA1C" in the last 72 hours. Lipid Profile No results for input(s): "CHOL", "HDL", "LDLCALC", "TRIG", "CHOLHDL", "LDLDIRECT" in the last 72 hours. Thyroid function studies No results for input(s): "TSH", "T4TOTAL",  "T3FREE", "THYROIDAB" in the last 72 hours.  Invalid input(s): "FREET3" Anemia work up No results for input(s): "VITAMINB12", "FOLATE", "FERRITIN", "TIBC", "IRON", "RETICCTPCT" in the last 72 hours. Urinalysis    Component Value Date/Time   COLORURINE YELLOW (A) 05/21/2021 1700   APPEARANCEUR CLOUDY (A) 05/21/2021 1700   APPEARANCEUR Clear 10/12/2017 1410   LABSPEC 1.018 05/21/2021 1700   LABSPEC 1.003 11/24/2013 2117   PHURINE 6.0 05/21/2021 1700   GLUCOSEU NEGATIVE 05/21/2021 1700   GLUCOSEU Negative 11/24/2013 2117   HGBUR NEGATIVE 05/21/2021 1700   BILIRUBINUR NEGATIVE 05/21/2021 1700   BILIRUBINUR Negative 10/12/2017 1410   BILIRUBINUR Negative 11/24/2013 2117   KETONESUR NEGATIVE 05/21/2021 1700   PROTEINUR NEGATIVE 05/21/2021 1700   NITRITE NEGATIVE 05/21/2021 1700   LEUKOCYTESUR NEGATIVE 05/21/2021 1700   LEUKOCYTESUR Negative 11/24/2013 2117   Sepsis Labs Recent Labs  Lab 07/28/21 1319 07/31/21 0605  WBC 15.1* 13.9*   Microbiology Recent Results (from the past 240 hour(s))  SARS Coronavirus 2 by RT PCR (hospital order, performed in [place] hospital lab) *cepheid single result test* Anterior Nasal Swab     Status: None   Collection Time: 07/22/21  6:49 PM   Specimen: Anterior Nasal Swab  Result Value Ref Range Status   SARS Coronavirus 2 by RT PCR NEGATIVE NEGATIVE Final    Comment: (NOTE) SARS-CoV-2 target nucleic acids are NOT DETECTED.  The SARS-CoV-2 RNA is generally detectable in upper and lower respiratory specimens during the acute phase of infection. The lowest concentration of SARS-CoV-2 viral copies this assay can detect is 250 copies / mL. A negative result does not preclude SARS-CoV-2 infection and should not be used as the sole basis for treatment or other patient management decisions.  A negative result may occur with improper specimen collection / handling, submission of specimen other than nasopharyngeal swab, presence of viral mutation(s)  within the areas targeted by this assay, and inadequate number of viral copies (<250 copies / mL). A negative result must be combined with clinical observations, patient history, and epidemiological information.  Fact Sheet for Patients:   https://www.patel.info/  Fact Sheet for Healthcare Providers: https://hall.com/  This test is not yet approved or  cleared by the [place] FDA and has been authorized for detection and/or diagnosis of SARS-CoV-2 by FDA under an Emergency Use Authorization (EUA).  This EUA will remain in effect (meaning this test can be used) for the duration of the COVID-19 declaration  under Section 564(b)(1) of the Act, 21 U.S.C. section 360bbb-3(b)(1), unless the authorization is terminated or revoked sooner.  Performed at [facility], [address] [place], [place] [address]   Blood culture (routine x 2)     Status: None   Collection Time: 07/22/21  7:40 PM   Specimen: BLOOD  Result Value Ref Range Status   Specimen Description BLOOD LEFT ASSIST CONTROL  Final   Special Requests   Final    BOTTLES DRAWN AEROBIC AND ANAEROBIC Blood Culture adequate volume   Culture   Final    NO GROWTH 5 DAYS Performed at [facility], [address] [place], [place] [address]    Report Status 07/27/2021 FINAL  Final  Blood culture (routine x 2)     Status: None   Collection Time: 07/22/21  7:41 PM   Specimen: BLOOD  Result Value Ref Range Status   Specimen Description BLOOD RIGHT HAND  Final   Special Requests   Final    BOTTLES DRAWN AEROBIC AND ANAEROBIC Blood Culture adequate volume   Culture   Final    NO GROWTH 5 DAYS Performed at [facility], [address], [place], [place] [address]    Report Status 07/27/2021 FINAL  Final  Expectorated Sputum Assessment w Gram Stain, Rflx to Resp Cult     Status: None   Collection Time: 07/23/21  8:51 AM   Specimen: Sputum  Result Value Ref  Range Status   Specimen Description SPUTUM  Final   Special Requests NONE  Final   Sputum evaluation   Final    THIS SPECIMEN IS ACCEPTABLE FOR SPUTUM CULTURE Performed at [facility], [address], [place], [place] [address]    Report Status 07/23/2021 FINAL  Final  Culture, Respiratory w Gram Stain     Status: None   Collection Time: 07/23/21  8:51 AM   Specimen: SPU  Result Value Ref Range Status   Specimen Description   Final    SPUTUM Performed at [facility], [address], [place], [place] [address]    Special Requests   Final    NONE Reflexed from [id number] Performed at [facility], [address] [place], [place] [address]    Gram Stain NO WBC SEEN RARE BUDDING YEAST SEEN   Final   Culture   Final    FEW Normal respiratory flora-no Staph aureus or Pseudomonas seen Performed at [facility], [place] [address] [place], [place] [address]    Report Status 07/26/2021 FINAL  Final  Respiratory (~20 pathogens) panel by PCR     Status: None   Collection Time: 07/23/21 12:07 PM   Specimen: Nasopharyngeal Swab; Respiratory  Result Value Ref Range Status   Adenovirus NOT DETECTED NOT DETECTED Final   Coronavirus 229E NOT DETECTED NOT DETECTED Final    Comment: (NOTE) The Coronavirus on the Respiratory Panel, DOES NOT test for the novel  Coronavirus (2019 nCoV)    Coronavirus HKU1 NOT DETECTED NOT DETECTED Final   Coronavirus NL63 NOT DETECTED NOT DETECTED Final   Coronavirus OC43 NOT DETECTED NOT DETECTED Final   Metapneumovirus NOT DETECTED NOT DETECTED Final   Rhinovirus / Enterovirus NOT DETECTED NOT DETECTED Final   Influenza A NOT DETECTED NOT DETECTED Final   Influenza B NOT DETECTED NOT DETECTED Final   Parainfluenza Virus 1 NOT DETECTED NOT DETECTED Final   Parainfluenza Virus 2 NOT DETECTED NOT DETECTED Final   Parainfluenza Virus 3 NOT  DETECTED NOT DETECTED Final   Parainfluenza Virus 4 NOT DETECTED NOT DETECTED Final    Respiratory Syncytial Virus NOT DETECTED NOT DETECTED Final   Bordetella pertussis NOT DETECTED NOT DETECTED Final   Bordetella Parapertussis NOT DETECTED NOT DETECTED Final   Chlamydophila pneumoniae NOT DETECTED NOT DETECTED Final   Mycoplasma pneumoniae NOT DETECTED NOT DETECTED Final    Comment: Performed at [facility], [place] [address], [place], [place] [address]  Resp Panel by RT-PCR (Flu A&B, Covid) Anterior Nasal Swab     Status: None   Collection Time: 07/28/21  1:19 PM   Specimen: Anterior Nasal Swab  Result Value Ref Range Status   SARS Coronavirus 2 by RT PCR NEGATIVE NEGATIVE Final    Comment: (NOTE) SARS-CoV-2 target nucleic acids are NOT DETECTED.  The SARS-CoV-2 RNA is generally detectable in upper respiratory specimens during the acute phase of infection. The lowest concentration of SARS-CoV-2 viral copies this assay can detect is 138 copies/mL. A negative result does not preclude SARS-Cov-2 infection and should not be used as the sole basis for treatment or other patient management decisions. A negative result may occur with  improper specimen collection/handling, submission of specimen other than nasopharyngeal swab, presence of viral mutation(s) within the areas targeted by this assay, and inadequate number of viral copies(<138 copies/mL). A negative result must be combined with clinical observations, patient history, and epidemiological information. The expected result is Negative.  Fact Sheet for Patients:  EntrepreneurPulse.com.au  Fact Sheet for Healthcare Providers:  IncredibleEmployment.be  This test is no t yet approved or cleared by the [place] FDA and  has been authorized for detection and/or diagnosis of SARS-CoV-2 by FDA under an Emergency Use Authorization (EUA). This EUA will remain  in effect (meaning this test can be used) for the duration of the COVID-19 declaration under Section 564(b)(1) of the  Act, 21 U.S.C.section 360bbb-3(b)(1), unless the authorization is terminated  or revoked sooner.       Influenza A by PCR NEGATIVE NEGATIVE Final   Influenza B by PCR NEGATIVE NEGATIVE Final    Comment: (NOTE) The Xpert Xpress SARS-CoV-2/FLU/RSV plus assay is intended as an aid in the diagnosis of influenza from Nasopharyngeal swab specimens and should not be used as a sole basis for treatment. Nasal washings and aspirates are unacceptable for Xpert Xpress SARS-CoV-2/FLU/RSV testing.  Fact Sheet for Patients: EntrepreneurPulse.com.au  Fact Sheet for Healthcare Providers: IncredibleEmployment.be  This test is not yet approved or cleared by the [place] FDA and has been authorized for detection and/or diagnosis of SARS-CoV-2 by FDA under an Emergency Use Authorization (EUA). This EUA will remain in effect (meaning this test can be used) for the duration of the COVID-19 declaration under Section 564(b)(1) of the Act, 21 U.S.C. section 360bbb-3(b)(1), unless the authorization is terminated or revoked.  Performed at [facility], [address] [place], [place] [address]      Time coordinating discharge: Over 30 minutes  SIGNED:   Sidney Ace, MD  [facility] 07/31/2021, 1:44 PM Pager   If 7PM-7AM, please contact night-coverage

## 2021-07-31 NOTE — Progress Notes (Signed)
Heart Failure Nurse Navigator Note  Met with patient today, she was lying in bed in no acute distress.  She states that she is probably going to be discharged home.  Again discussed the ventricle health program.  He states that her son had reviewed the information and was very much for her getting involved in this program.  Made her aware that once I set the application in that the company would contact her and then she would receive the equipment in 2 to 3 days after discharge.  She voices understanding  She was given a scale for after the ventricle health program is completed that she will have a scale to weigh herself at home.  Discussed procedures and what to report.  She voices understanding.  Again discussed follow-up in the outpatient heart failure clinic.  She states that she had no further questions.  Pricilla Riffle RN CHFN

## 2021-07-31 NOTE — Care Management Important Message (Signed)
Important Message  Patient Details  Name: Kerri Carter MRN: [id number] Date of Birth: 01-30-1941   Medicare Important Message Given:  Yes     Dannette Barbara 07/31/2021, 12:22 PM

## 2021-07-31 NOTE — Progress Notes (Signed)
   07/31/21 1427  Clinical Encounter Type  Visited With Health care provider;Patient  Referral From Nurse  Consult/Referral To Chaplain  Spiritual Encounters  Spiritual Needs Other (Comment) (HCPOA)   Chaplain Kristin Lamagna responded to a call for assistance to [employer].  Chaplain B initially met with pt. Documents appear complete and pt is alert and oriented and appears able to authorize document.  Chasplain B facilitated notarization. HCPOA designates Kerri Carter as primary decision maker, [phone], mobile). Secondary decsion maker is Kerri Carter, [phone]).  Chaplain Amberlyn Martinezgarcia made copies for the family and also scanned document in to chart via [email or web address].  No further needs assessed at this time.

## 2021-07-31 NOTE — TOC Progression Note (Signed)
Transition of Care [facility]) - Progression Note    Patient Details  Name: Kerri Carter MRN: [id number] Date of Birth: 09/25/1940  Transition of Care [facility]) CM/SW Contact  Eileen Stanford, LCSW Phone Number: 07/31/2021, 9:35 AM  Clinical Narrative:   Pt is active with [facility], [place], and Aid.         Expected Discharge Plan and Services                                                 Social Determinants of Health (SDOH) Interventions    Readmission Risk Interventions    05/24/2021    2:35 PM  Readmission Risk Prevention Plan  Transportation Screening Complete  PCP or Specialist Appt within 3-5 Days Complete  HRI or [place] Complete  Social Work Consult for [place] Planning/Counseling Complete  Palliative Care Screening Not Applicable  Medication Review [employer]) Complete

## 2021-07-31 NOTE — Consult Note (Signed)
   [facility] Inpatient Consult   07/31/2021  Kerri Carter November 06, 1940 [id number]  [facility] [ACO] Patient: [name] PPO  *Remote review, patient at [facility]  Primary Care Provider:  McLean-Scocuzza, Nino Glow, MD, [facility], [place], is an embedded provider with a Chronic Care Management team and program, and is listed for the transition of care follow up and appointments.  Patient was screened for high risk scores and needs for Embedded practice service care coordination and for discharge needs, patient has had 3 admissions in the past month  Plan: A referral request for care coordination made for high risk readmission and aware of TOC needs for post hospital needs.  Please contact for further questions,  Natividad Brood, RN BSN [facility] Liaison  [phone] business mobile phone Toll free office [phone]  Fax number: [phone] [name]

## 2021-07-31 NOTE — Progress Notes (Signed)
Discharge instructions (including medications) discussed with and copy provided to patient/caregiver. Pt awaiting for grandson to get off work and pick her up after 3:30pm.

## 2021-07-31 NOTE — Progress Notes (Signed)
[facility] NOTE       Patient ID: Kerri Carter MRN: [id number] DOB/AGE: 1940/07/15 81 y.o.  Admit date: 07/28/2021 Referring Physician Dr. Laurey Arrow Primary Physician Dr. Olivia Mackie McLean-Scocuzza Primary Cardiologist Lattie Haw Povsic, PA-C Reason for Consultation dyspnea on exertion, severe MR  HPI: Kerri Carter is a 81 year old female with a PMH of HFpEF (LVEF 55-60%, g1DD 07/29/21), mod-severe MR, mi-mod AS, COPD, OSA on 2L at night, nonobstructive CAD by heart cath 03/2011, hypertension, hyperlipidemia, PAD, CKD3 with history of R nephrectomy 1988, conversion disorder, anxiety, L3-L4 disc herniation s/p lumbar fusion extension 05/23/21 with 10 day hospitalization complicated by respiratory failure 2/2 pneumonia and new onset paroxysmal atrial fibrillation who presented to [facility] ED on 07/28/2021 with shortness of breath. Cardiology is consulted for further assistance.   Interval History: -weaned off supplemental oxygen  -renal function stable (possibly at new baseline) after PO torsemide started yesterday -some SOB with ambulation with PT, but better than presentation -no palpitations, LE swelling, chest pain  Review of systems complete and found to be negative unless listed above     Past Medical History:  Diagnosis Date   Anxiety    Aortic stenosis 07/09/2015   a.) TTE 07/06/2015: EF 55-60%; mild AS with MPG 13.0 mmHg.   Asthma    Bipolar disorder ([place])    C. difficile diarrhea 2010   a.) following ABX course during hospital admission   Carotid atherosclerosis, bilateral    a.) Moderate; < 50% stenosis BILATERAL ICAs.   Cataract    a.) s/p BILATERAL extraction   Chicken pox    CKD (chronic kidney disease), stage III (HCC)    a. s/p R nephrectomy./ aneurysm   Conversion disorder    COPD (chronic obstructive pulmonary disease) (HCC)    Depression    Essential hypertension    GERD (gastroesophageal reflux disease)    Heart murmur    Hyperlipidemia    ILD  (interstitial lung disease) (HCC)    mild; 2/2 RA diagnosis   Inflammatory arthritis    a. hands/carpal tunnel.  b. Low titer rheumatoid factor. c. Negative anti-CCP antibodies. d. Plaquenil.   Macular degeneration    Nocturnal hypoxemia    Non-Obstructive CAD    a. 07/2009 Cath ([facility]): nonobs dzs;  b. 03/2011 Cath [facility]): nonobs dzs.   Osteoarthritis    a. Knees.   PAD (peripheral artery disease) (HCC)    PUD (peptic ulcer disease)    S/P right hip fracture    11/01/16 s/p repair   Shoulder pain    Sleep apnea    no cpap / minimal   Spinal stenosis at L4-L5 level    severe with L4/L5 anterolisthesis grade 1 anterolisthesis    Toxic maculopathy    Valvular heart disease    a.) TTE 07/06/2015: EF 55-60%; Mild MR/AR/TR; Mild AS with MPG 13.0 mmHg.    Past Surgical History:  Procedure Laterality Date   APPENDECTOMY     APPLICATION OF INTRAOPERATIVE CT SCAN N/A 05/23/2021   Procedure: APPLICATION OF INTRAOPERATIVE CT SCAN;  Surgeon: Meade Maw, MD;  Location: [facility] ORS;  Service: Neurosurgery;  Laterality: N/A;   BACK SURGERY     lumbar fusion   BUNIONECTOMY Right    CATARACT EXTRACTION, BILATERAL     CESAREAN SECTION     x1   CHOLECYSTECTOMY N/A 05/11/2016   Procedure: LAPAROSCOPIC CHOLECYSTECTOMY;  Surgeon: Florene Glen, MD;  Location: [facility] ORS;  Service: General;  Laterality: N/A;   COLONOSCOPY WITH PROPOFOL  N/A 04/02/2016   Procedure: COLONOSCOPY WITH PROPOFOL;  Surgeon: Jonathon Bellows, MD;  Location: [facility] ENDOSCOPY;  Service: Endoscopy;  Laterality: N/A;   ENDOSCOPIC RETROGRADE CHOLANGIOPANCREATOGRAPHY (ERCP) WITH PROPOFOL N/A 05/08/2016   Procedure: ENDOSCOPIC RETROGRADE CHOLANGIOPANCREATOGRAPHY (ERCP) WITH PROPOFOL;  Surgeon: Lucilla Lame, MD;  Location: [facility] ENDOSCOPY;  Service: Endoscopy;  Laterality: N/A;   ERCP     with biliary spincterotomy 05/08/16 Dr. Allen Norris for choledocholithiasis    ESOPHAGEAL DILATION  04/02/2016   Procedure: ESOPHAGEAL DILATION;  Surgeon: Jonathon Bellows,  MD;  Location: [facility] ENDOSCOPY;  Service: Endoscopy;;   ESOPHAGOGASTRODUODENOSCOPY (EGD) WITH PROPOFOL N/A 04/02/2016   Procedure: ESOPHAGOGASTRODUODENOSCOPY (EGD) WITH PROPOFOL;  Surgeon: Jonathon Bellows, MD;  Location: [facility] ENDOSCOPY;  Service: Endoscopy;  Laterality: N/A;   HIP ARTHROPLASTY Right 11/01/2016   Procedure: ARTHROPLASTY BIPOLAR HIP (HEMIARTHROPLASTY);  Surgeon: Corky Mull, MD;  Location: [facility] ORS;  Service: Orthopedics;  Laterality: Right;   LUMBAR LAMINECTOMY/DECOMPRESSION MICRODISCECTOMY N/A 12/11/2020   Procedure: LEFT L2-3 MICRODISCECTOMY, L3-4 AND L5-S1 DECOMPRESSION;  Surgeon: Meade Maw, MD;  Location: [facility] ORS;  Service: Neurosurgery;  Laterality: N/A;   NEPHRECTOMY  1988   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   ORIF SCAPHOID FRACTURE Left 12/21/2019   Procedure: OPEN REDUCTION INTERNAL FIXATION (ORIF) OF LEFT SCAPULAR NONUNION WITH BONE GRAFT;  Surgeon: Corky Mull, MD;  Location: [facility] ORS;  Service: Orthopedics;  Laterality: Left;   osteoporosis     noted DEXA 08/19/16    REPLACEMENT TOTAL KNEE Right    REVERSE SHOULDER ARTHROPLASTY Right 11/04/2017   Procedure: REVERSE SHOULDER ARTHROPLASTY;  Surgeon: Corky Mull, MD;  Location: [facility] ORS;  Service: Orthopedics;  Laterality: Right;   REVERSE SHOULDER ARTHROPLASTY Left 07/26/2018   Procedure: REVERSE SHOULDER ARTHROPLASTY;  Surgeon: Corky Mull, MD;  Location: [facility] ORS;  Service: Orthopedics;  Laterality: Left;   TONSILLECTOMY     TOTAL HIP ARTHROPLASTY  12/10/11   [facility] left hip   TOTAL HIP ARTHROPLASTY Bilateral    TRANSFORAMINAL LUMBAR INTERBODY FUSION (TLIF) WITH PEDICLE SCREW FIXATION 3 LEVEL N/A 05/23/2021   Procedure: OPEN L2-4 TRANSFORAMINAL LUMBAR INTERBODY FUSION (TLIF) WITH L2-5 POSTERIOR SPINAL FUSION;  Surgeon: Meade Maw, MD;  Location: [facility] ORS;  Service: Neurosurgery;  Laterality: N/A;   TUBAL LIGATION      Medications Prior to Admission  Medication Sig Dispense  Refill Last Dose   amoxicillin-clavulanate (AUGMENTIN) 875-125 MG tablet Take 1 tablet by mouth 2 (two) times daily. 8 tablet 0 Past Week   budesonide (PULMICORT) 0.25 MG/2ML nebulizer solution USE 1 VIAL  IN  NEBULIZER TWICE  DAILY - rinse mouth after treatment 120 mL 11 Past Week   busPIRone (BUSPAR) 5 MG tablet Take 1 tablet (5 mg total) by mouth 2 (two) times daily.   Past Week   Cholecalciferol (VITAMIN D3) 50 MCG (2000 UT) TABS Take 4,000 Units by mouth daily.   Past Week   clopidogrel (PLAVIX) 75 MG tablet Take 75 mg by mouth daily.   Past Week   escitalopram (LEXAPRO) 10 MG tablet TAKE 1 TABLET BY MOUTH EVERY DAY 90 tablet 1 Past Week   etodolac (LODINE) 500 MG tablet Take 500 mg by mouth daily.   Past Week   furosemide (LASIX) 40 MG tablet Take 0.5 tablets (20 mg total) by mouth daily. 30 tablet  Past Week   gabapentin (NEURONTIN) 300 MG capsule Take 2 capsules (600 mg total) by mouth in the morning and at bedtime. 360 capsule 3 Past  Week   ipratropium-albuterol (DUONEB) 0.5-2.5 (3) MG/3ML SOLN Take 3 mLs by nebulization 3 (three) times daily as needed. 360 mL 3 Past Week   lamoTRIgine (LAMICTAL) 100 MG tablet TAKE 1 TAB 2 TIMES DAILY. FURTHER REFILLS NEW PSYCH FOR ALL PSYCH MEDS ONLY TEMP SUPPLY FROM PCP 180 tablet 1 Past Week   leflunomide (ARAVA) 20 MG tablet Take 1 tablet (20 mg total) by mouth daily.   Past Week   lidocaine (LIDODERM) 5 % Place 1 patch onto the skin 2 (two) times daily as needed. Remove & Discard patch within 12 hours or as directed by MD (Patient taking differently: Place 1 patch onto the skin daily. Remove & Discard patch within 12 hours or as directed by MD) 120 patch 2 Past Week   lovastatin (MEVACOR) 20 MG tablet TAKE 1 TABLET BY MOUTH EVERYDAY AT BEDTIME *STOP TALKING '40MG'$ * (Patient taking differently: Take 20 mg by mouth at bedtime.) 90 tablet 3 Past Week   methocarbamol (ROBAXIN) 500 MG tablet Take 500 mg by mouth 4 (four) times daily as needed.   Past Week    mirabegron ER (MYRBETRIQ) 50 MG TB24 tablet Take 1 tablet (50 mg total) by mouth daily. 90 tablet 3 Past Week   montelukast (SINGULAIR) 10 MG tablet TAKE 1 TABLET BY MOUTH EVERY DAY 90 tablet 1 Past Week   multivitamin-lutein (OCUVITE-LUTEIN) CAPS capsule Take 1 capsule by mouth at bedtime.   Past Week   nebivolol (BYSTOLIC) 10 MG tablet Take 0.5 tablets (5 mg total) by mouth in the morning and at bedtime.   Past Week   oxyCODONE-acetaminophen (PERCOCET) 5-325 MG tablet Take 1 tablet by mouth every 4 (four) hours as needed for severe pain. 15 tablet 0 Past Week   pantoprazole (PROTONIX) 40 MG tablet TAKE 1 TABLET BY MOUTH 2 TIMES DAILY 30 MIN BEFORE FOOD (NOTE REDUCTION IN FREQUENCY) 180 tablet 1 Past Week   predniSONE (DELTASONE) 20 MG tablet Take 2 tablets (40 mg total) by mouth daily with breakfast. 8 tablet 0 Past Week   QUEtiapine (SEROQUEL) 25 MG tablet TAKE 1 TABLET (25 MG TOTAL) BY MOUTH AT BEDTIME. AGAIN LAST FILL FURTHER REFILLS FROM PSYCHIATRY NO EXCEPTIONS 90 tablet 0 Past Week   sucralfate (CARAFATE) 1 g tablet Take 1 tablet (1 g total) by mouth 2 (two) times daily. 180 tablet 3 Past Week   Adalimumab 40 MG/0.4ML PNKT Inject 40 mg into the skin every 14 (fourteen) days.      albuterol (VENTOLIN HFA) 108 (90 Base) MCG/ACT inhaler Inhale 2 puffs into the lungs every 6 (six) hours as needed for wheezing or shortness of breath. 18 g 11 prn   amLODipine (NORVASC) 5 MG tablet Take 5 mg by mouth daily.      cyclobenzaprine (FLEXERIL) 10 MG tablet Take 1 tablet (10 mg total) by mouth 3 (three) times daily as needed for muscle spasms. 30 tablet 0 prn   dicyclomine (BENTYL) 10 MG capsule Take 1 capsule (10 mg total) by mouth 2 (two) times daily as needed for spasms. (Patient taking differently: Take 20 mg by mouth 2 (two) times daily.) 180 capsule 3 prn   famotidine (PEPCID) 20 MG tablet TAKE 1 TABLET (20 MG TOTAL) BY MOUTH DAILY. BEFORE BREAKFAST OR DINNER 90 tablet 1 prn   fluticasone (FLONASE)  50 MCG/ACT nasal spray Place 2 sprays into both nostrils daily. In am   prn   OXYGEN Inhale 2 L into the lungs at bedtime.  zoledronic acid (RECLAST) 5 MG/100ML SOLN injection Inject 5 mg into the vein once.       Social History   Socioeconomic History   Marital status: Widowed    Spouse name: richard   Number of children: 2   Years of education: Some Coll   Highest education level: Some college, no degree  Occupational History   Occupation: Retired    Comment: retired  Tobacco Use   Smoking status: Former    Packs/day: 0.50    Years: 20.00    Total pack years: 10.00    Types: Cigarettes    Quit date: 02/02/1974    Years since quitting: 47.5   Smokeless tobacco: Never  Vaping Use   Vaping Use: Never used  Substance and Sexual Activity   Alcohol use: No   Drug use: No   Sexual activity: Not Currently  Other Topics Concern   Not on file  Social History Narrative   Lives at home in [place] grandson, his wife and their child.   Husband Darthula Desa died covid 41 late 1/2022mrried x 562years   Right-handed.   6 cups coffee per day.   Social Determinants of Health   Financial Resource Strain: Low Risk  (06/18/2021)   Overall Financial Resource Strain (CARDIA)    Difficulty of Paying Living Expenses: Not hard at all  Food Insecurity: No Food Insecurity (06/18/2021)   Hunger Vital Sign    Worried About Running Out of Food in the Last Year: Never true    Ran Out of Food in the Last Year: Never true  Transportation Needs: Unmet Transportation Needs (06/26/2021)   PRAPARE - Transportation    Lack of Transportation (Medical): Yes    Lack of Transportation (Non-Medical): Yes  Physical Activity: Inactive (11/24/2019)   Exercise Vital Sign    Days of Exercise per Week: 0 days    Minutes of Exercise per Session: 0 min  Stress: No Stress Concern Present (06/18/2021)   [place]   Feeling of Stress : Not  at all  Social Connections: Moderately Integrated (06/18/2021)   Social Connection and Isolation Panel [NHANES]    Frequency of Communication with Friends and Family: More than three times a week    Frequency of Social Gatherings with Friends and Family: Never    Attends Religious Services: More than 4 times per year    Active Member of [employer] Organizations: No    Attends CArchivistMeetings: Never    Marital Status: Married  IHuman resources officerViolence: Not At Risk (06/18/2021)   Humiliation, Afraid, Rape, and Kick questionnaire    Fear of Current or Ex-Partner: No    Emotionally Abused: No    Physically Abused: No    Sexually Abused: No    Family History  Problem Relation Age of Onset   Rheum arthritis Mother    Asthma Mother    Parkinson's disease Mother    Heart disease Mother    Stroke Mother    Hypertension Mother    Heart attack Father    Heart disease Father    Hypertension Father    Peripheral Artery Disease Father    Diabetes Son    Gout Son    Asthma Sister    Heart disease Sister    Lung cancer Sister    Heart disease Sister    Heart disease Sister    Breast cancer Sister    Heart attack Sister  Heart disease Brother    Heart disease Maternal Grandmother    Diabetes Maternal Grandmother    Colon cancer Maternal Grandmother    Cancer Maternal Grandmother        Hodgkins lymphoma   Heart disease Brother    Alcohol abuse Brother    Depression Brother    Dementia Son       PHYSICAL EXAM General: Elderly Caucasian female, well nourished, in no acute distress. Sitting upright in hospital bed HEENT:  Normocephalic and atraumatic. Neck:  No JVD.  Lungs: Normal respiratory effort on room air.  Clear bilaterally with decreased air entry without appreciable crackles or wheezes. Heart: HRRR with frequent premature beats. Normal S1 and S2, 2/6 systolic murmur best heard at RUSB Abdomen: Soft, nontender, nondistended Msk: Normal strength and tone for  age. Extremities: Warm and well perfused. No clubbing, cyanosis. No peripheral edema Neuro: Alert and oriented X 3. Psych:  Answers questions appropriately.   Labs:   Lab Results  Component Value Date   WBC 13.9 (H) 07/31/2021   HGB 9.4 (L) 07/31/2021   HCT 31.1 (L) 07/31/2021   MCV 80.2 07/31/2021   PLT 324 07/31/2021    Recent Labs  Lab 07/31/21 0605  NA 141  K 3.8  CL 101  CO2 31  BUN 25*  CREATININE 1.27*  CALCIUM 8.9  GLUCOSE 100*    Lab Results  Component Value Date   CKTOTAL 58 11/24/2013   CKMB 0.7 11/24/2013   TROPONINI <0.03 05/07/2016    Lab Results  Component Value Date   CHOL 176 12/01/2019   CHOL 147 12/01/2018   CHOL 144 01/21/2018   Lab Results  Component Value Date   HDL 53.60 12/01/2019   HDL 42 12/01/2018   HDL 40.50 01/21/2018   Lab Results  Component Value Date   LDLCALC 60 12/01/2018   LDLCALC 86 08/06/2017   LDLCALC 43 06/08/2016   Lab Results  Component Value Date   TRIG 360.0 (H) 12/01/2019   TRIG 227 (H) 12/01/2018   TRIG 255.0 (H) 01/21/2018   Lab Results  Component Value Date   CHOLHDL 3 12/01/2019   CHOLHDL 3.5 12/01/2018   CHOLHDL 4 01/21/2018   Lab Results  Component Value Date   LDLDIRECT 60.0 12/01/2019   LDLDIRECT 60.0 01/21/2018      Radiology: DG Shoulder Left  Result Date: 07/31/2021 CLINICAL DATA:  Left shoulder hump. Multiple prior left shoulder surgeries. EXAM: LEFT SHOULDER - 2+ VIEW; LEFT HUMERUS - 2+ VIEW COMPARISON:  Left shoulder x-rays dated December 21, 2019. FINDINGS: Prior reverse total shoulder arthroplasty. No evidence of hardware failure or loosening. Prior ORIF of the acromion with displacement of the distal acromion fragment from the hardware, new since postoperative x-rays from November 2021. Hardware approximates the skin surface. Heterotopic ossification posterior to the arthroplasty. The mid to distal humerus and elbow are unremarkable. IMPRESSION: 1. Failed ORIF of the acromion with  displacement of the distal acromion fragment from the hardware, new since postoperative x-rays from December 21, 2019. 2. Prior reverse total shoulder arthroplasty without hardware complication. Electronically Signed   By: Titus Dubin M.D.   On: 07/31/2021 11:09   DG Humerus Left  Result Date: 07/31/2021 CLINICAL DATA:  Left shoulder hump. Multiple prior left shoulder surgeries. EXAM: LEFT SHOULDER - 2+ VIEW; LEFT HUMERUS - 2+ VIEW COMPARISON:  Left shoulder x-rays dated December 21, 2019. FINDINGS: Prior reverse total shoulder arthroplasty. No evidence of hardware failure or loosening. Prior ORIF of the  acromion with displacement of the distal acromion fragment from the hardware, new since postoperative x-rays from November 2021. Hardware approximates the skin surface. Heterotopic ossification posterior to the arthroplasty. The mid to distal humerus and elbow are unremarkable. IMPRESSION: 1. Failed ORIF of the acromion with displacement of the distal acromion fragment from the hardware, new since postoperative x-rays from December 21, 2019. 2. Prior reverse total shoulder arthroplasty without hardware complication. Electronically Signed   By: Titus Dubin M.D.   On: 07/31/2021 11:09   ECHOCARDIOGRAM COMPLETE  Result Date: 07/30/2021    ECHOCARDIOGRAM REPORT   Patient Name:   SURYA FOLDEN Date of Exam: 07/29/2021 Medical Rec #:  [id number]     Height:       61.5 in Accession #:    [id number]    Weight:       173.1 lb Date of Birth:  Jun 11, 1940     BSA:          1.787 m Patient Age:    3 years      BP:           130/52 mmHg Patient Gender: F             HR:           86 bpm. Exam Location:  [facility] Procedure: 2D Echo, Color Doppler and Cardiac Doppler Indications:     Mitral valve Insufficiency I34.0  History:         Patient has prior history of Echocardiogram examinations, most                  recent 05/26/2021. COPD; Risk Factors:Hypertension. Aortic                  stenosis.  Sonographer:     Sherrie Sport Referring Phys:  [id number] [place] Nicolai Labonte Diagnosing Phys: Isaias Cowman MD  Sonographer Comments: No parasternal window, no subcostal window and Technically challenging study due to limited acoustic windows. Image acquisition challenging due to COPD. IMPRESSIONS  1. Left ventricular ejection fraction, by estimation, is 55 to 60%. The left ventricle has normal function. The left ventricle has no regional wall motion abnormalities. Left ventricular diastolic parameters are consistent with Grade I diastolic dysfunction (impaired relaxation).  2. Right ventricular systolic function is normal. The right ventricular size is normal.  3. The mitral valve is normal in structure. Moderate to severe mitral valve regurgitation. No evidence of mitral stenosis.  4. The aortic valve is normal in structure. Aortic valve regurgitation is moderate. Mild to moderate aortic valve stenosis.  5. The inferior vena cava is normal in size with greater than 50% respiratory variability, suggesting right atrial pressure of 3 mmHg. FINDINGS  Left Ventricle: Left ventricular ejection fraction, by estimation, is 55 to 60%. The left ventricle has normal function. The left ventricle has no regional wall motion abnormalities. The left ventricular internal cavity size was normal in size. There is  no left ventricular hypertrophy. Left ventricular diastolic parameters are consistent with Grade I diastolic dysfunction (impaired relaxation). Right Ventricle: The right ventricular size is normal. No increase in right ventricular wall thickness. Right ventricular systolic function is normal. Left Atrium: Left atrial size was normal in size. Right Atrium: Right atrial size was normal in size. Pericardium: There is no evidence of pericardial effusion. Mitral Valve: The mitral valve is normal in structure. There is moderate thickening of the mitral valve leaflet(s). Moderate to severe mitral valve regurgitation. No evidence of mitral valve  stenosis. MV peak gradient, 7.6 mmHg. The mean mitral valve gradient is 4.0 mmHg. Tricuspid Valve: The tricuspid valve is normal in structure. Tricuspid valve regurgitation is mild . No evidence of tricuspid stenosis. Aortic Valve: The aortic valve is normal in structure. Aortic valve regurgitation is moderate. Mild to moderate aortic stenosis is present. Aortic valve mean gradient measures 17.0 mmHg. Aortic valve peak gradient measures 29.4 mmHg. Aortic valve area, by VTI measures 1.37 cm. Pulmonic Valve: The pulmonic valve was normal in structure. Pulmonic valve regurgitation is not visualized. No evidence of pulmonic stenosis. Aorta: The aortic root is normal in size and structure. Venous: The inferior vena cava is normal in size with greater than 50% respiratory variability, suggesting right atrial pressure of 3 mmHg. IAS/Shunts: No atrial level shunt detected by color flow Doppler.  LEFT VENTRICLE PLAX 2D LVIDd:         4.30 cm   Diastology LVIDs:         3.10 cm   LV e' medial:    3.92 cm/s LV PW:         1.30 cm   LV E/e' medial:  26.3 LV IVS:        1.00 cm   LV e' lateral:   3.15 cm/s LVOT diam:     2.00 cm   LV E/e' lateral: 32.7 LV SV:         70 LV SV Index:   39 LVOT Area:     3.14 cm  RIGHT VENTRICLE RV Basal diam:  2.90 cm RV S prime:     10.70 cm/s TAPSE (M-mode): 2.2 cm LEFT ATRIUM              Index        RIGHT ATRIUM           Index LA diam:        4.50 cm  2.52 cm/m   RA Area:     13.30 cm LA Vol (A2C):   56.5 ml  31.62 ml/m  RA Volume:   30.50 ml  17.07 ml/m LA Vol (A4C):   119.0 ml 66.59 ml/m LA Biplane Vol: 86.4 ml  48.35 ml/m  AORTIC VALVE AV Area (Vmax):    1.23 cm AV Area (Vmean):   1.28 cm AV Area (VTI):     1.37 cm AV Vmax:           271.33 cm/s AV Vmean:          189.000 cm/s AV VTI:            0.515 m AV Peak Grad:      29.4 mmHg AV Mean Grad:      17.0 mmHg LVOT Vmax:         106.00 cm/s LVOT Vmean:        77.200 cm/s LVOT VTI:          0.224 m LVOT/AV VTI ratio: 0.43   AORTA Ao Root diam: 1.80 cm MITRAL VALVE                TRICUSPID VALVE MV Area (PHT): 2.93 cm     TR Peak grad:   20.4 mmHg MV Area VTI:   1.55 cm     TR Vmax:        226.00 cm/s MV Peak grad:  7.6 mmHg MV Mean grad:  4.0 mmHg     SHUNTS MV Vmax:       1.38 m/s  Systemic VTI:  0.22 m MV Vmean:      90.4 cm/s    Systemic Diam: 2.00 cm MV Decel Time: 259 msec MV E velocity: 103.00 cm/s MV A velocity: 120.00 cm/s MV E/A ratio:  0.86 Isaias Cowman MD Electronically signed by Isaias Cowman MD Signature Date/Time: 07/30/2021/12:19:57 PM    Final    DG Chest Portable 1 View  Result Date: 07/28/2021 CLINICAL DATA:  Shortness of breath.  History of COPD. EXAM: PORTABLE CHEST 1 VIEW COMPARISON:  Radiographs 07/22/2021 and 05/29/2021.  CT 07/22/2021. FINDINGS: 1322 hours. Lordotic positioning. The heart size and mediastinal contours are stable. Interval increased ill-defined density in the right upper lobe which could reflect an early infiltrate. The left lung appears clear. There is no evidence of pleural effusion or pneumothorax. Patient is status post bilateral shoulder reverse arthroplasty, and there are several old right-sided rib fractures. No acute osseous findings are evident. Telemetry leads overlie the chest. IMPRESSION: Possible new ill-defined infiltrate or atelectasis in the right upper lobe. Electronically Signed   By: Richardean Sale M.D.   On: 07/28/2021 13:36   CT Chest Wo Contrast  Result Date: 07/22/2021 CLINICAL DATA:  Respiratory illness cough with leg swelling EXAM: CT CHEST WITHOUT CONTRAST TECHNIQUE: Multidetector CT imaging of the chest was performed following the standard protocol without IV contrast. RADIATION DOSE REDUCTION: This exam was performed according to the departmental dose-optimization program which includes automated exposure control, adjustment of the mA and/or kV according to patient size and/or use of iterative reconstruction technique. COMPARISON:  Chest x-ray  07/22/2021, chest CT 03/21/2011, 05/01/2020 FINDINGS: Cardiovascular: Limited evaluation without intravenous contrast. Moderate aortic atherosclerosis. No aneurysm. Coronary vascular calcifications. No pericardial effusion. Mitral calcification. Mediastinum/Nodes: Midline trachea. No thyroid mass. No suspicious lymph nodes. Esophagus within normal limits. Lungs/Pleura: No consolidation, pleural effusion or pneumothorax. Mild lower lobe bronchial wall thickening with scattered mucous plugging. Mild tree-in-bud nodularity in the left lower lobe, series 3 image 79. Upper Abdomen: No acute abnormality. Musculoskeletal: No acute osseous abnormality. Previously noted nodular opacity may correspond to lead attachment on the left upper anterior chest wall. IMPRESSION: 1. No suspicious pulmonary nodule to correspond to the questioned radiographic abnormality. 2. Mild lower lobe bronchial wall thickening with scattered mucous plugging. Small amount of tree-in-bud nodularity in the left lower lobe suggesting respiratory infection. Aortic Atherosclerosis (ICD10-I70.0). Electronically Signed   By: Donavan Foil M.D.   On: 07/22/2021 19:50   DG Chest 2 View  Result Date: 07/22/2021 CLINICAL DATA:  Cough and swelling in the legs EXAM: CHEST - 2 VIEW COMPARISON:  Chest radiograph 05/29/2021 FINDINGS: The cardiomediastinal silhouette is normal. The lungs hyperinflated with flattening of the diaphragms consistent with underlying COPD. There is no focal consolidation or pulmonary edema. Previously seen opacities in the left lung have significantly improved/resolved. There is no pleural effusion or pneumothorax. There is a 1.3 cm nodular opacity in the left apex not seen on prior studies. Bilateral shoulder arthroplasty hardware, left clavicle surgical fixation, and lumbar spine fusion hardware are noted. Opacities projecting over the right fifth and sixth ribs are consistent with reflect old fractures. IMPRESSION: 1. No  radiographic evidence of acute cardiopulmonary process. Previously seen opacities in the left lung on the study from 05/29/2021 have resolved. 2. 1.3 cm nodular opacity in the left apex not seen on prior studies. Recommend nonemergent chest CT to evaluate for a pulmonary nodule. Electronically Signed   By: Valetta Mole M.D.   On: 07/22/2021 18:06   CT HEAD  WO CONTRAST (5MM)  Result Date: 07/17/2021 CLINICAL DATA:  Fall EXAM: CT HEAD WITHOUT CONTRAST TECHNIQUE: Contiguous axial images were obtained from the base of the skull through the vertex without intravenous contrast. RADIATION DOSE REDUCTION: This exam was performed according to the departmental dose-optimization program which includes automated exposure control, adjustment of the mA and/or kV according to patient size and/or use of iterative reconstruction technique. COMPARISON:  02/10/2021 FINDINGS: Brain: No evidence of acute infarction, hemorrhage, cerebral edema, mass, mass effect, or midline shift. No extra-axial fluid collection. Unchanged size and configuration of the ventricles, favored to be related to cerebral volume loss, although normal pressure hydrocephalus can appear similar. Periventricular white matter changes, likely the sequela of chronic small vessel ischemic disease. Vascular: No hyperdense vessel. Atherosclerotic calcifications in the intracranial carotid and vertebral arteries. Skull: Normal. Negative for fracture or focal lesion. Sinuses/Orbits: Mucosal thickening in the left-greater-than-right ethmoid air cells. Status post bilateral lens replacements. Other: The mastoid air cells are well aerated. IMPRESSION: No acute intracranial process. Electronically Signed   By: Merilyn Baba M.D.   On: 07/17/2021 14:55   DG Humerus Right  Result Date: 07/17/2021 CLINICAL DATA:  Right arm pain after fall. EXAM: RIGHT HUMERUS - 2+ VIEW COMPARISON:  None Available. FINDINGS: Status post right shoulder arthroplasty. Old right rib fractures  are noted. No acute fracture or dislocation is noted. IMPRESSION: No acute abnormality seen. Electronically Signed   By: Marijo Conception M.D.   On: 07/17/2021 14:20   DG Shoulder Right  Result Date: 07/17/2021 CLINICAL DATA:  Right arm pain after fall. EXAM: RIGHT SHOULDER - 2+ VIEW COMPARISON:  None Available. FINDINGS: Status post right total shoulder arthroplasty. Glenoid and humeral components are well situated. Old right rib fractures are noted. No acute fracture or dislocation is noted. IMPRESSION: No acute abnormality is noted. Electronically Signed   By: Marijo Conception M.D.   On: 07/17/2021 14:19    ECHO 05/26/2021  1. Left ventricular ejection fraction, by estimation, is 50 to 55%. The  left ventricle has low normal function. Left ventricular endocardial  border not optimally defined to evaluate regional wall motion. Left  ventricular diastolic parameters are  consistent with Grade II diastolic dysfunction (pseudonormalization).   2. Right ventricular systolic function is normal. The right ventricular  size is normal. Tricuspid regurgitation signal is inadequate for assessing  PA pressure.   3. Left atrial size was mildly dilated.   4. The mitral valve is normal in structure. Severe mitral valve  regurgitation. Mild mitral stenosis. The mean mitral valve gradient is 7.0  mmHg. Moderate mitral annular calcification.   5. The aortic valve was not well visualized. Aortic valve regurgitation  is moderate. Mild to moderate aortic valve stenosis. Aortic valve mean  gradient measures 18.0 mmHg.   6. The inferior vena cava is normal in size with greater than 50%  respiratory variability, suggesting right atrial pressure of 3 mmHg.   7. Challenging image quality. Consider a TEE to evaluate mitral valve.   EKG reviewed by me: on admission, sinus rhythm LVH rate 99   ASSESSMENT AND PLAN:  Laporchia Nakajima is a 81 year old female with a PMH of HFpEF (LVEF 55-60%, g1DD 07/29/21), mod-severe MR,  mi-mod AS, COPD, OSA on 2L at night, nonobstructive CAD by heart cath 03/2011, hypertension, hyperlipidemia, PAD, CKD3 with history of R nephrectomy 1988, conversion disorder, anxiety, L3-L4 disc herniation s/p lumbar fusion extension 05/23/21 with 10 day hospitalization complicated by respiratory failure 2/2 pneumonia and new  onset paroxysmal atrial fibrillation who presented to [facility] ED on 07/28/2021 with shortness of breath. Cardiology is consulted for further assistance.   #acute on chronic respiratory failure 2/2 pneumonia #acute on chronic HFpEF (LVEF 55-60%, g1DD)  #mod-severe MR, mild - moderate AS by echo 07/29/21 She presents with worsening dyspnea on exertion following a 2-day hospitalization (discharged 6/22) after being treated for left lower lobe pneumonia.  She reports worsening lower extremity edema, orthopnea, and in and out of atrial fibrillation versus sinus rhythm with frequent PACs since presentation.  She does have known severe MR and mild to moderate aortic stenosis in addition to diastolic heart failure  - complicating her decompensation due to the respiratory illness.  Per the patient and her grandson she reportedly has clinically improved from a volume overload standpoint after 40 mg of IV Lasix x2 overnight.  BNP was elevated to 670 on admission.  She is subjectively short of breath and also on 3 L on top of her recent baseline of only 2 L at night., but able to carry on lengthy conversations with me without increased work of breathing -Agree with current therapy per primary team for pneumonia. -S/p 40 mg of IV Lasix x2 with bump in creatinine from 0.99 to 1.28 - increased to 1.6 yesterday afternoon and back to 1.27 and GFR 43 today -continue PO torsemide '20mg'$  once daily  -Continue GDMT with home Bystolic 5 mg twice daily, not on ACEi/ARB due to anaphylaxis with lisinopril, can consider addition of spironolactone pending her renal function -Repeat echocardiogram looks overall similar  to prior study in April 2023 -[place] for discharge today from a cardiac standpoint. Will need close follow-up with her regular cardiologist at [facility] at discharge (has an appointment with Dr. Lovena Neighbours on 08/15/2021)  #Elevated troponin Minimally elevated and flat trending at 77-69, in the absence of chest pain this likely represents demand ischemia and not ACS.  #paroxysmal atrial fibrillation -Discovered during hospitalization in April 2023 for elective lumbar fusion, neurosurgery recommended starting anticoagulation after at least 2 weeks postop, but unfortunately she never followed up with cardiology and was thus never started on [facility]. -She is currently in sinus rhythm with frequent PACs, on telemetry has some paroxysms of A-fib, rate controlled  -Her CHA2DS2-VASc is 4, so systemic AC is recommended for stroke risk reduction.  Continue Eliquis 5 mg twice daily. -she has been on plavix '75mg'$  daily after a TIA ~ 1 year ago, will d/c this in favor of eliquis to reduce bleeding risk   This patient's plan of care was discussed and created with Dr. Saralyn Pilar and he is in agreement.  Signed: Tristan Schroeder , PA-C 07/31/2021, 12:46 PM [facility]

## 2021-08-01 ENCOUNTER — Telehealth: Payer: Self-pay

## 2021-08-01 ENCOUNTER — Telehealth: Payer: Self-pay | Admitting: Internal Medicine

## 2021-08-01 ENCOUNTER — Other Ambulatory Visit: Payer: Self-pay | Admitting: Internal Medicine

## 2021-08-01 DIAGNOSIS — F419 Anxiety disorder, unspecified: Secondary | ICD-10-CM

## 2021-08-01 DIAGNOSIS — I10 Essential (primary) hypertension: Secondary | ICD-10-CM

## 2021-08-01 DIAGNOSIS — F3176 Bipolar disorder, in full remission, most recent episode depressed: Secondary | ICD-10-CM

## 2021-08-01 DIAGNOSIS — I2510 Atherosclerotic heart disease of native coronary artery without angina pectoris: Secondary | ICD-10-CM

## 2021-08-01 MED ORDER — ESCITALOPRAM OXALATE 10 MG PO TABS: 10.0000 mg | ORAL_TABLET | Freq: Every day | ORAL | 3 refills | Status: DC

## 2021-08-01 MED ORDER — VITAMIN D3 50 MCG (2000 UT) PO TABS: 4000.0000 [IU] | ORAL_TABLET | Freq: Every day | ORAL | 3 refills | Status: DC

## 2021-08-01 MED ORDER — NEBIVOLOL HCL 5 MG PO TABS: 5.0000 mg | ORAL_TABLET | Freq: Two times a day (BID) | ORAL | 3 refills | Status: DC

## 2021-08-01 MED ORDER — AMLODIPINE BESYLATE 5 MG PO TABS: 5.0000 mg | ORAL_TABLET | Freq: Every day | ORAL | 3 refills | Status: DC

## 2021-08-01 NOTE — Telephone Encounter (Signed)
S/w pt and informed her meds have been sent to refill except for arava which comes from rheumatology. Instructed pt to call their ofc for refill. Pt verbalized understanding.

## 2021-08-01 NOTE — Addendum Note (Signed)
Addended by: Orland Mustard on: 08/01/2021 05:22 PM   Modules accepted: Orders

## 2021-08-01 NOTE — Telephone Encounter (Signed)
Transition Care Management Unsuccessful Follow-up Telephone Call  Date of discharge and from where:  07/31/21  Attempts:  1st Attempt  Reason for unsuccessful TCM follow-up call:  No answer/busy. Will follow.

## 2021-08-01 NOTE — Telephone Encounter (Signed)
Refilled meds arava comes from rheumatology call them to refill  Details Date Type Department Care Team Description  04/14/2021 Telephone [facility]   [address], [place] [id number]   [phone]

## 2021-08-01 NOTE — Telephone Encounter (Signed)
Referred to [facility] clinic cardiology asap

## 2021-08-01 NOTE — Telephone Encounter (Signed)
Pt need refill on Vitamin d 3, amlodipine, LEXAPRO, ARAVA and BYSTOLIC sent to [facility]

## 2021-08-04 NOTE — Telephone Encounter (Signed)
Transition Care Management Follow-up Telephone Call Date of discharge and from where: 07/31/21 [facility]. How have you been since you were released from the hospital? One fall next day after discharge on 6/30. New onset of pain under R breast and around rib cage. Did not seek medical assistance. Denies hitting head, broken skin and bruising. Managing pain with taking Robaxin as directed.  Denies chest pain, shortness of breath at daytime, N/V/D, confusion .  Any questions or concerns? No  Items Reviewed: Did the pt receive and understand the discharge instructions provided? Yes  Medications obtained and verified? Yes  Other? Oxygen at bedtime 2 L Any new allergies since your discharge? No  Dietary orders reviewed? Yes Do you have support at home? Yes   Home Care and Equipment/Supplies: Were home health services ordered? Yes, HH in progress.   Functional Questionnaire: (I = Independent and D = Dependent) ADLs: HH and family assist as needed  Eating- I  Maintaining continence- BSC, keeping walking to a minimum.   Transferring/Ambulation- I  Managing Meds- Family assist as needed  Follow up appointments reviewed:  PCP Hospital f/u appt confirmed? Yes  Scheduled to see PCP on 08/08/21 @ 11:00. [facility] f/u appt confirmed? Yes  Scheduled to see Cardiology.  Are transportation arrangements needed? No , not at this time. Patient to notify office if any change and assistance needed.  If their condition worsens, is the pt aware to call PCP or go to the Emergency Dept.? Yes Was the patient provided with contact information for the PCP's office or ED? Yes Was to pt encouraged to call back with questions or concerns? yes

## 2021-08-05 IMAGING — MG MM DIGITAL SCREENING BILAT W/ TOMO AND CAD
8 series · 8 of 24 positions shown · non-contrast
Comparison: Previous exam(s).

CLINICAL DATA: Screening.

EXAM:
DIGITAL SCREENING BILATERAL MAMMOGRAM WITH TOMOSYNTHESIS AND CAD
TECHNIQUE: Bilateral screening digital craniocaudal and mediolateral oblique
mammograms were obtained. Bilateral screening digital breast
tomosynthesis was performed. The images were evaluated with
computer-aided detection.

[L MLO synth-2D]
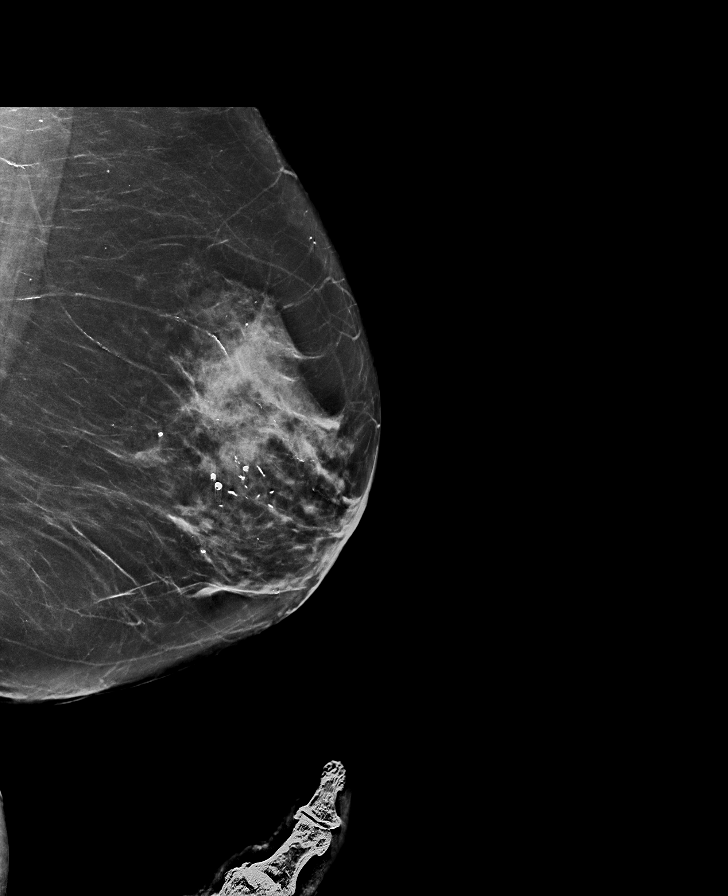

[R CC synth-2D]
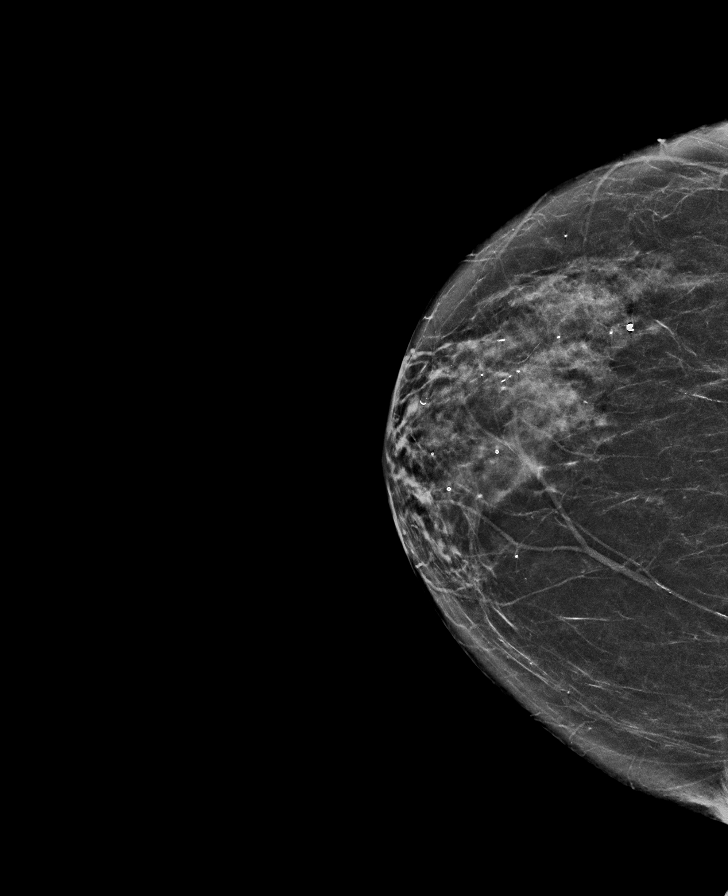

[L CC synth-2D]
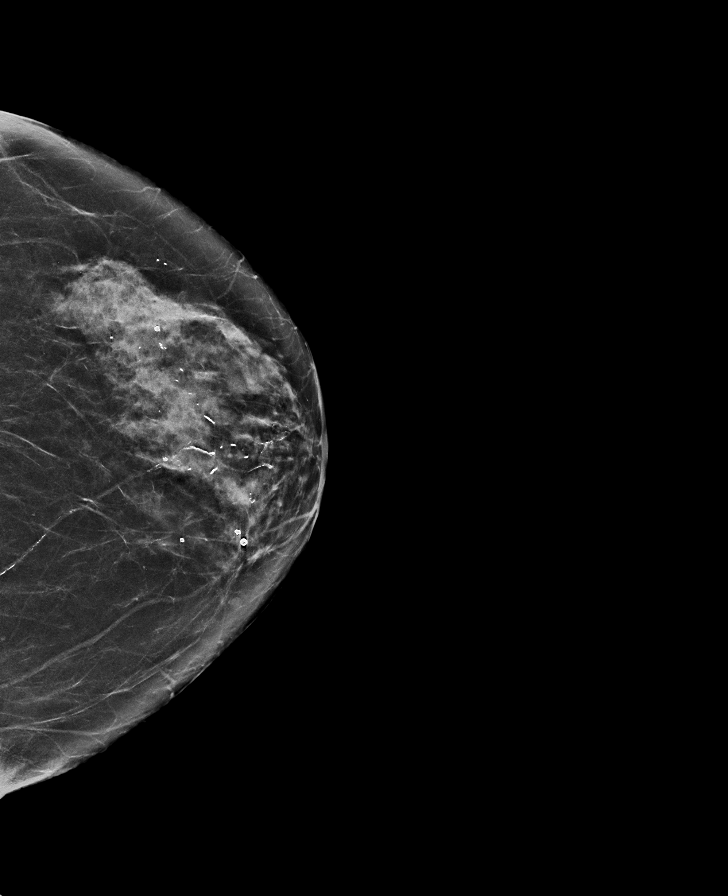

[R MLO synth-2D]
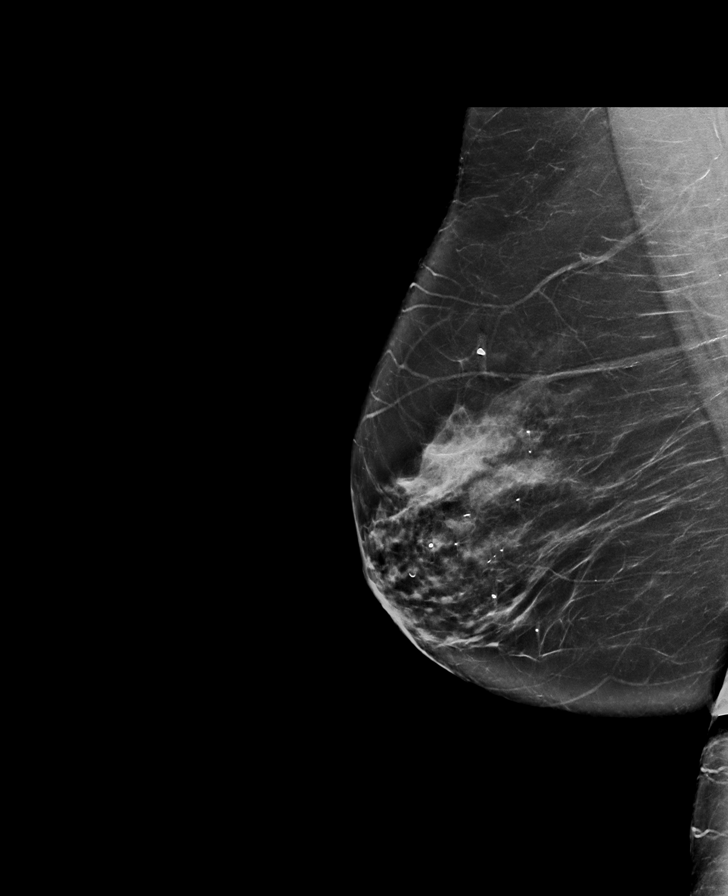

[L MLO tomo · tomo slice 37/73.0]
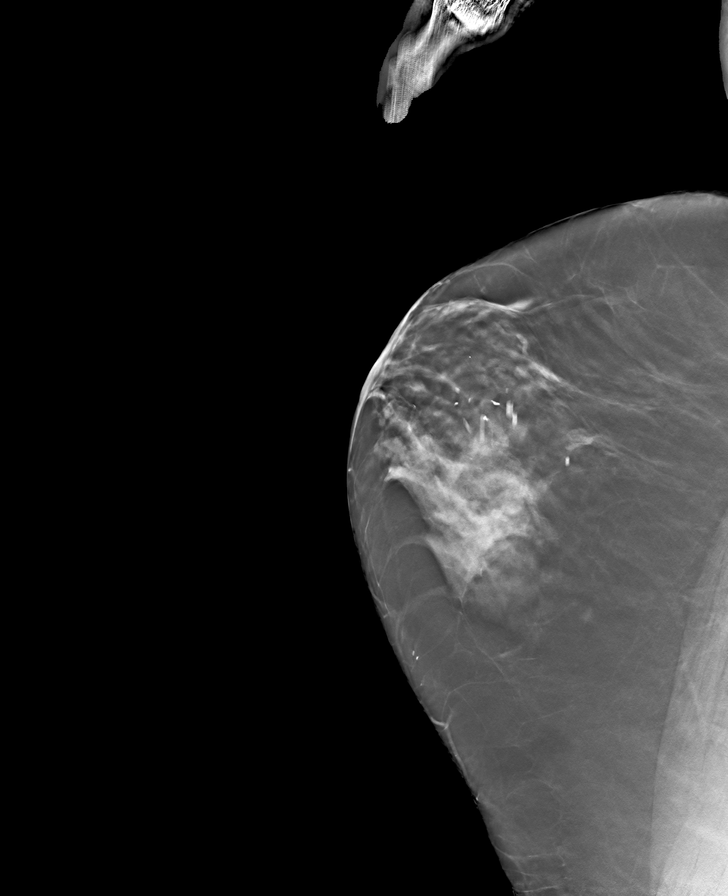

[R MLO tomo · tomo slice 39/77.0]
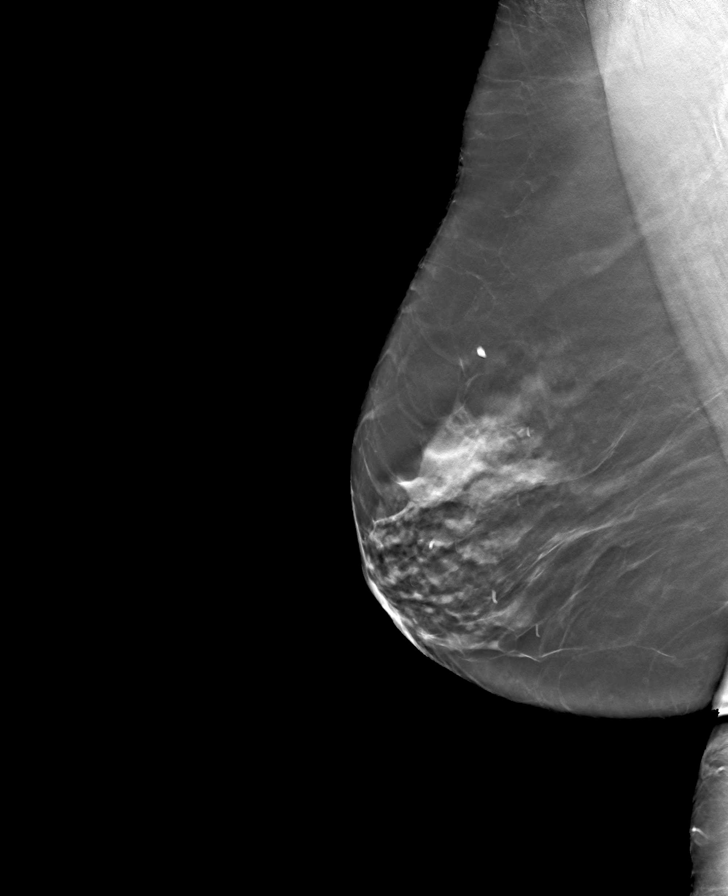

[L CC tomo · tomo slice 35/68.0]
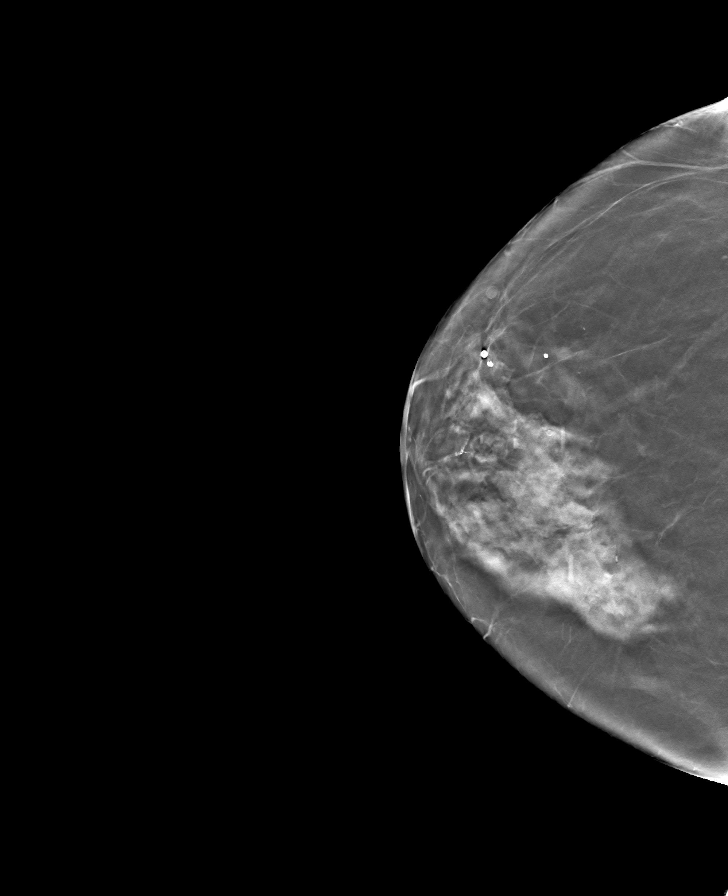

[R CC tomo · tomo slice 29/58.0]
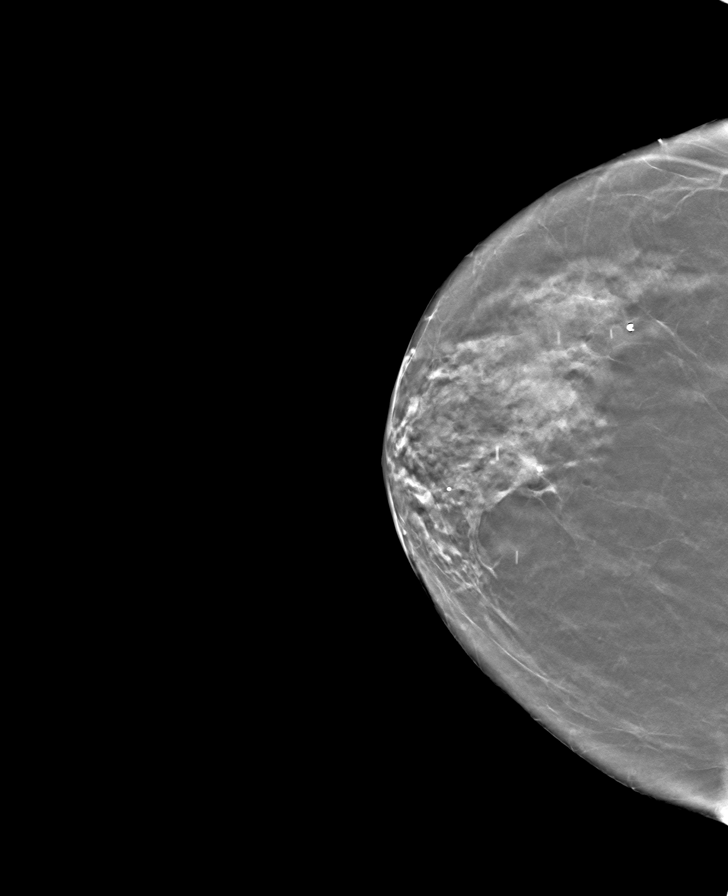

[8 of 24 positions shown; findings below may reference images not displayed]

ACR Breast Density Category c: The breast tissue is heterogeneously
dense, which may obscure small masses.
FINDINGS: There are no findings suspicious for malignancy.
IMPRESSION: No mammographic evidence of malignancy. A result letter of this
screening mammogram will be mailed directly to the patient.

RECOMMENDATION:
Screening mammogram in one year. (Code:[id number])

BI-RADS CATEGORY  1: Negative.

## 2021-08-06 ENCOUNTER — Telehealth: Payer: Self-pay | Admitting: Pulmonary Disease

## 2021-08-06 NOTE — Telephone Encounter (Signed)
Dr. Patsey Berthold, please advise once 06/26/2021 note has been completed. Thanks.

## 2021-08-07 ENCOUNTER — Encounter: Payer: Self-pay | Admitting: Pulmonary Disease

## 2021-08-08 ENCOUNTER — Inpatient Hospital Stay: Payer: Medicare PPO | Admitting: Internal Medicine

## 2021-08-08 NOTE — Telephone Encounter (Signed)
Last OV note has been faxed to [place].  Nothing further needed.

## 2021-08-11 NOTE — Progress Notes (Deleted)
   Patient ID: Kerri Carter, female    DOB: 05/30/1940, 81 y.o.   MRN: [id number]  HPI  Ms Kerri Carter is a 81 y/o female with a history of  Echo report from 07/29/21 reviewed and showed an EF of 55-60% along with moderate/severe MR but no LVH.   Admitted 07/28/21 due to shortness of breath. Found to be hypoxic and given nebulizer treatment and placed on bipap. Weaned off bipap to her oxygen at 2L. Cardiology consult obtained. Initially given IV lasix with transition to oral diuretic. PT evaluation done. Finishing prednisone taper. Discharged after 3 days. Admitted 07/22/21 due to shortness of breath and increased cough due to COPD exacerbation & pneumonia. Given antibiotics and prednisone taper. PT evaluation done. Discharged after 2 days.   She presents today for her initial visit with a chief complaint of  Review of Systems    Physical Exam    Assessment & Plan:  1: Chronic heart failure with preserved ejection fraction without structural changes- - NYHA class - saw cardiology (Kerri Carter) 05/16/21 - BNP 07/29/21 was 503.4  2: HTN with CKD- - BP - saw PCP (Kerri Carter) 06/24/21 - BMP 07/31/21 reviewed and showed sodium 141, potassium 3.8, creatinine 1.27 & GFR 43  3: COPD- - saw pulmonology Kerri Carter) 06/26/21  4: Anxiety/ depression-  5: Memory loss- - saw neurology (Kerri Carter) 07/15/21  6: Sleep apnea-

## 2021-08-12 ENCOUNTER — Ambulatory Visit: Payer: Medicare PPO | Admitting: Family

## 2021-08-12 ENCOUNTER — Telehealth: Payer: Self-pay | Admitting: Family

## 2021-08-12 NOTE — Telephone Encounter (Signed)
Patient did not show for her [facility] Failure [facility] appointment on 08/12/21. Will attempt to reschedule.

## 2021-08-13 ENCOUNTER — Telehealth: Payer: Self-pay | Admitting: Pulmonary Disease

## 2021-08-13 DIAGNOSIS — J449 Chronic obstructive pulmonary disease, unspecified: Secondary | ICD-10-CM

## 2021-08-13 DIAGNOSIS — J8489 Other specified interstitial pulmonary diseases: Secondary | ICD-10-CM

## 2021-08-13 DIAGNOSIS — R059 Cough, unspecified: Secondary | ICD-10-CM

## 2021-08-13 MED ORDER — BUDESONIDE 0.25 MG/2ML IN SUSP: RESPIRATORY_TRACT | 11 refills | Status: DC

## 2021-08-13 MED ORDER — IPRATROPIUM-ALBUTEROL 0.5-2.5 (3) MG/3ML IN SOLN: 3.0000 mL | Freq: Three times a day (TID) | RESPIRATORY_TRACT | 3 refills | Status: DC | PRN

## 2021-08-13 NOTE — Telephone Encounter (Signed)
Pulmicort has been sent to [facility]. Duoneb sent to [facility] per patient request.  Patient is aware and voiced her understanding.  Nothing further needed.

## 2021-08-14 ENCOUNTER — Encounter: Payer: Medicare PPO | Admitting: Neurosurgery

## 2021-08-14 ENCOUNTER — Inpatient Hospital Stay: Payer: Medicare PPO | Admitting: Internal Medicine

## 2021-08-14 DIAGNOSIS — J449 Chronic obstructive pulmonary disease, unspecified: Secondary | ICD-10-CM | POA: Diagnosis not present

## 2021-08-15 ENCOUNTER — Telehealth: Payer: Self-pay

## 2021-08-15 DIAGNOSIS — I080 Rheumatic disorders of both mitral and aortic valves: Secondary | ICD-10-CM | POA: Diagnosis not present

## 2021-08-15 DIAGNOSIS — I351 Nonrheumatic aortic (valve) insufficiency: Secondary | ICD-10-CM | POA: Diagnosis not present

## 2021-08-15 DIAGNOSIS — I10 Essential (primary) hypertension: Secondary | ICD-10-CM | POA: Diagnosis not present

## 2021-08-15 DIAGNOSIS — Z7901 Long term (current) use of anticoagulants: Secondary | ICD-10-CM | POA: Diagnosis not present

## 2021-08-15 DIAGNOSIS — Z79899 Other long term (current) drug therapy: Secondary | ICD-10-CM | POA: Diagnosis not present

## 2021-08-15 DIAGNOSIS — I340 Nonrheumatic mitral (valve) insufficiency: Secondary | ICD-10-CM | POA: Diagnosis not present

## 2021-08-15 DIAGNOSIS — Z8616 Personal history of COVID-19: Secondary | ICD-10-CM | POA: Diagnosis not present

## 2021-08-15 DIAGNOSIS — E785 Hyperlipidemia, unspecified: Secondary | ICD-10-CM | POA: Diagnosis not present

## 2021-08-15 DIAGNOSIS — I4891 Unspecified atrial fibrillation: Secondary | ICD-10-CM | POA: Diagnosis not present

## 2021-08-15 NOTE — Chronic Care Management (AMB) (Signed)
  Care Management   Outreach Note  08/15/2021 Name: Kerri Carter MRN: [id number] DOB: 12-26-1940  Referred by: McLean-Scocuzza, Nino Glow, MD Reason for referral : Care Coordination (Outreach to schedule referral  with [facility] RNCM )   An unsuccessful telephone outreach was attempted today. The patient was referred to the case management team for assistance with care management and care coordination.   Follow Up Plan:  The care management team will reach out to the patient again over the next 3 days.  If patient returns call to provider office, please advise to call  [place] * at [phone], [place], [place] [address] Direct Dial: [phone] [email or web address]

## 2021-08-15 NOTE — Progress Notes (Signed)
Error

## 2021-08-18 ENCOUNTER — Telehealth: Payer: Self-pay | Admitting: Family

## 2021-08-18 NOTE — Telephone Encounter (Signed)
Patient called and LVM requesting assistance on what i believe to be something regarding the ventricle program. I called and LVM back with patient stating our resource in the clinic that can help her with that is out of the office until tomorrow and i will have her reach back out.

## 2021-08-19 ENCOUNTER — Telehealth: Payer: Self-pay

## 2021-08-20 ENCOUNTER — Other Ambulatory Visit: Payer: Self-pay

## 2021-08-20 ENCOUNTER — Inpatient Hospital Stay
Admission: EM | Admit: 2021-08-20 | Discharge: 2021-08-22 | DRG: 190 | Disposition: A | Discharge status: Home Health | Payer: Medicare PPO | Attending: Internal Medicine | Admitting: Internal Medicine

## 2021-08-20 ENCOUNTER — Encounter: Payer: Self-pay | Admitting: Emergency Medicine

## 2021-08-20 ENCOUNTER — Emergency Department: Payer: Medicare PPO

## 2021-08-20 DIAGNOSIS — Z9981 Dependence on supplemental oxygen: Secondary | ICD-10-CM

## 2021-08-20 DIAGNOSIS — Z803 Family history of malignant neoplasm of breast: Secondary | ICD-10-CM

## 2021-08-20 DIAGNOSIS — Z811 Family history of alcohol abuse and dependence: Secondary | ICD-10-CM | POA: Diagnosis not present

## 2021-08-20 DIAGNOSIS — Z886 Allergy status to analgesic agent status: Secondary | ICD-10-CM

## 2021-08-20 DIAGNOSIS — Z833 Family history of diabetes mellitus: Secondary | ICD-10-CM

## 2021-08-20 DIAGNOSIS — J40 Bronchitis, not specified as acute or chronic: Secondary | ICD-10-CM

## 2021-08-20 DIAGNOSIS — J9621 Acute and chronic respiratory failure with hypoxia: Secondary | ICD-10-CM | POA: Diagnosis present

## 2021-08-20 DIAGNOSIS — Z807 Family history of other malignant neoplasms of lymphoid, hematopoietic and related tissues: Secondary | ICD-10-CM | POA: Diagnosis not present

## 2021-08-20 DIAGNOSIS — Z981 Arthrodesis status: Secondary | ICD-10-CM | POA: Diagnosis not present

## 2021-08-20 DIAGNOSIS — Z20822 Contact with and (suspected) exposure to covid-19: Secondary | ICD-10-CM | POA: Diagnosis not present

## 2021-08-20 DIAGNOSIS — Z7901 Long term (current) use of anticoagulants: Secondary | ICD-10-CM

## 2021-08-20 DIAGNOSIS — Z823 Family history of stroke: Secondary | ICD-10-CM | POA: Diagnosis not present

## 2021-08-20 DIAGNOSIS — K219 Gastro-esophageal reflux disease without esophagitis: Secondary | ICD-10-CM | POA: Diagnosis present

## 2021-08-20 DIAGNOSIS — Z801 Family history of malignant neoplasm of trachea, bronchus and lung: Secondary | ICD-10-CM

## 2021-08-20 DIAGNOSIS — I5033 Acute on chronic diastolic (congestive) heart failure: Secondary | ICD-10-CM | POA: Diagnosis present

## 2021-08-20 DIAGNOSIS — Z882 Allergy status to sulfonamides status: Secondary | ICD-10-CM

## 2021-08-20 DIAGNOSIS — Z96643 Presence of artificial hip joint, bilateral: Secondary | ICD-10-CM | POA: Diagnosis present

## 2021-08-20 DIAGNOSIS — E782 Mixed hyperlipidemia: Secondary | ICD-10-CM | POA: Diagnosis not present

## 2021-08-20 DIAGNOSIS — R0602 Shortness of breath: Secondary | ICD-10-CM | POA: Diagnosis not present

## 2021-08-20 DIAGNOSIS — Z79899 Other long term (current) drug therapy: Secondary | ICD-10-CM

## 2021-08-20 DIAGNOSIS — Z905 Acquired absence of kidney: Secondary | ICD-10-CM

## 2021-08-20 DIAGNOSIS — Z800 Family history of malignant neoplasm of digestive organs: Secondary | ICD-10-CM

## 2021-08-20 DIAGNOSIS — Z91041 Radiographic dye allergy status: Secondary | ICD-10-CM

## 2021-08-20 DIAGNOSIS — I2510 Atherosclerotic heart disease of native coronary artery without angina pectoris: Secondary | ICD-10-CM | POA: Diagnosis present

## 2021-08-20 DIAGNOSIS — I130 Hypertensive heart and chronic kidney disease with heart failure and stage 1 through stage 4 chronic kidney disease, or unspecified chronic kidney disease: Secondary | ICD-10-CM | POA: Diagnosis not present

## 2021-08-20 DIAGNOSIS — D509 Iron deficiency anemia, unspecified: Secondary | ICD-10-CM | POA: Diagnosis present

## 2021-08-20 DIAGNOSIS — I5032 Chronic diastolic (congestive) heart failure: Secondary | ICD-10-CM | POA: Diagnosis not present

## 2021-08-20 DIAGNOSIS — Z820 Family history of epilepsy and other diseases of the nervous system: Secondary | ICD-10-CM

## 2021-08-20 DIAGNOSIS — E785 Hyperlipidemia, unspecified: Secondary | ICD-10-CM | POA: Diagnosis present

## 2021-08-20 DIAGNOSIS — I10 Essential (primary) hypertension: Secondary | ICD-10-CM | POA: Diagnosis not present

## 2021-08-20 DIAGNOSIS — Z87891 Personal history of nicotine dependence: Secondary | ICD-10-CM | POA: Diagnosis not present

## 2021-08-20 DIAGNOSIS — J441 Chronic obstructive pulmonary disease with (acute) exacerbation: Secondary | ICD-10-CM | POA: Diagnosis not present

## 2021-08-20 DIAGNOSIS — Z7951 Long term (current) use of inhaled steroids: Secondary | ICD-10-CM

## 2021-08-20 DIAGNOSIS — F3160 Bipolar disorder, current episode mixed, unspecified: Secondary | ICD-10-CM | POA: Diagnosis not present

## 2021-08-20 DIAGNOSIS — Z66 Do not resuscitate: Secondary | ICD-10-CM | POA: Diagnosis not present

## 2021-08-20 DIAGNOSIS — Z8249 Family history of ischemic heart disease and other diseases of the circulatory system: Secondary | ICD-10-CM | POA: Diagnosis not present

## 2021-08-20 DIAGNOSIS — I480 Paroxysmal atrial fibrillation: Secondary | ICD-10-CM

## 2021-08-20 DIAGNOSIS — I739 Peripheral vascular disease, unspecified: Secondary | ICD-10-CM | POA: Diagnosis not present

## 2021-08-20 DIAGNOSIS — Z825 Family history of asthma and other chronic lower respiratory diseases: Secondary | ICD-10-CM

## 2021-08-20 DIAGNOSIS — N1832 Chronic kidney disease, stage 3b: Secondary | ICD-10-CM | POA: Diagnosis not present

## 2021-08-20 DIAGNOSIS — Z887 Allergy status to serum and vaccine status: Secondary | ICD-10-CM

## 2021-08-20 DIAGNOSIS — R059 Cough, unspecified: Secondary | ICD-10-CM | POA: Diagnosis not present

## 2021-08-20 DIAGNOSIS — Z96651 Presence of right artificial knee joint: Secondary | ICD-10-CM | POA: Diagnosis present

## 2021-08-20 DIAGNOSIS — Z885 Allergy status to narcotic agent status: Secondary | ICD-10-CM

## 2021-08-20 HISTORY — DX: Chronic obstructive pulmonary disease with (acute) exacerbation: J44.1

## 2021-08-20 LAB — BLOOD GAS, VENOUS
Acid-Base Excess: 2.4 mmol/L — ABNORMAL HIGH (ref 0.0–2.0)
Bicarbonate: 27.8 mmol/L (ref 20.0–28.0)
O2 Saturation: 66.4 %
Patient temperature: 37
pCO2, Ven: 46 mmHg (ref 44–60)
pH, Ven: 7.39 (ref 7.25–7.43)
pO2, Ven: 42 mmHg (ref 32–45)

## 2021-08-20 LAB — CBC WITH DIFFERENTIAL/PLATELET
Abs Immature Granulocytes: 0.15 10*3/uL — ABNORMAL HIGH (ref 0.00–0.07)
Basophils Absolute: 0.1 10*3/uL (ref 0.0–0.1)
Basophils Relative: 1 %
Eosinophils Absolute: 1.3 10*3/uL — ABNORMAL HIGH (ref 0.0–0.5)
Eosinophils Relative: 10 %
HCT: 29.5 % — ABNORMAL LOW (ref 36.0–46.0)
Hemoglobin: 8.5 g/dL — ABNORMAL LOW (ref 12.0–15.0)
Immature Granulocytes: 1 %
Lymphocytes Relative: 36 %
Lymphs Abs: 4.7 10*3/uL — ABNORMAL HIGH (ref 0.7–4.0)
MCH: 23.2 pg — ABNORMAL LOW (ref 26.0–34.0)
MCHC: 28.8 g/dL — ABNORMAL LOW (ref 30.0–36.0)
MCV: 80.4 fL (ref 80.0–100.0)
Monocytes Absolute: 1.3 10*3/uL — ABNORMAL HIGH (ref 0.1–1.0)
Monocytes Relative: 10 %
Neutro Abs: 5.6 10*3/uL (ref 1.7–7.7)
Neutrophils Relative %: 42 %
Platelets: 327 10*3/uL (ref 150–400)
RBC: 3.67 MIL/uL — ABNORMAL LOW (ref 3.87–5.11)
RDW: 16.6 % — ABNORMAL HIGH (ref 11.5–15.5)
WBC: 13.1 10*3/uL — ABNORMAL HIGH (ref 4.0–10.5)
nRBC: 0 % (ref 0.0–0.2)

## 2021-08-20 LAB — PROCALCITONIN: Procalcitonin: <0.1 ng/mL

## 2021-08-20 LAB — COMPREHENSIVE METABOLIC PANEL
ALT: 15 U/L (ref 0–44)
AST: 25 U/L (ref 15–41)
Albumin: 3.3 g/dL — ABNORMAL LOW (ref 3.5–5.0)
Alkaline Phosphatase: 104 U/L (ref 38–126)
Anion gap: 8 (ref 5–15)
BUN: 26 mg/dL — ABNORMAL HIGH (ref 8–23)
CO2: 26 mmol/L (ref 22–32)
Calcium: 8.3 mg/dL — ABNORMAL LOW (ref 8.9–10.3)
Chloride: 107 mmol/L (ref 98–111)
Creatinine, Ser: 1.52 mg/dL — ABNORMAL HIGH (ref 0.44–1.00)
GFR, Estimated: 35 mL/min — ABNORMAL LOW (ref >60)
Glucose, Bld: 135 mg/dL — ABNORMAL HIGH (ref 70–99)
Potassium: 3.5 mmol/L (ref 3.5–5.1)
Sodium: 141 mmol/L (ref 135–145)
Total Bilirubin: 0.4 mg/dL (ref 0.3–1.2)
Total Protein: 6.7 g/dL (ref 6.5–8.1)

## 2021-08-20 LAB — BRAIN NATRIURETIC PEPTIDE: B Natriuretic Peptide: 410.9 pg/mL — ABNORMAL HIGH (ref 0.0–100.0)

## 2021-08-20 LAB — RESP PANEL BY RT-PCR (FLU A&B, COVID) ARPGX2
Influenza A by PCR: NEGATIVE
Influenza B by PCR: NEGATIVE
SARS Coronavirus 2 by RT PCR: NEGATIVE

## 2021-08-20 MED ORDER — ONDANSETRON HCL 4 MG PO TABS: 4.0000 mg | ORAL_TABLET | Freq: Four times a day (QID) | ORAL | Status: DC | PRN

## 2021-08-20 MED ORDER — PREDNISONE 20 MG PO TABS
40.0000 mg | ORAL_TABLET | Freq: Every day | ORAL | Status: DC
  Administered 2021-08-21 – 2021-08-22 (×2): 40 mg via ORAL
  Filled 2021-08-20 (×2): qty 2

## 2021-08-20 MED ORDER — SODIUM CHLORIDE 0.9% FLUSH
3.0000 mL | Freq: Two times a day (BID) | INTRAVENOUS | Status: DC
  Administered 2021-08-20 – 2021-08-22 (×4): 3 mL via INTRAVENOUS

## 2021-08-20 MED ORDER — IPRATROPIUM-ALBUTEROL 0.5-2.5 (3) MG/3ML IN SOLN
3.0000 mL | Freq: Once | RESPIRATORY_TRACT | Status: AC
  Administered 2021-08-20: 3 mL via RESPIRATORY_TRACT
  Filled 2021-08-20: qty 3

## 2021-08-20 MED ORDER — MONTELUKAST SODIUM 10 MG PO TABS
10.0000 mg | ORAL_TABLET | Freq: Every day | ORAL | Status: DC
  Administered 2021-08-20 – 2021-08-22 (×3): 10 mg via ORAL
  Filled 2021-08-20 (×3): qty 1

## 2021-08-20 MED ORDER — LAMOTRIGINE 100 MG PO TABS
100.0000 mg | ORAL_TABLET | Freq: Two times a day (BID) | ORAL | Status: DC
  Administered 2021-08-20 – 2021-08-22 (×4): 100 mg via ORAL
  Filled 2021-08-20 (×4): qty 1

## 2021-08-20 MED ORDER — FAMOTIDINE 20 MG PO TABS
20.0000 mg | ORAL_TABLET | Freq: Every day | ORAL | Status: DC
  Administered 2021-08-20 – 2021-08-22 (×3): 20 mg via ORAL
  Filled 2021-08-20 (×3): qty 1

## 2021-08-20 MED ORDER — ONDANSETRON HCL 4 MG/2ML IJ SOLN: 4.0000 mg | Freq: Four times a day (QID) | INTRAMUSCULAR | Status: DC | PRN

## 2021-08-20 MED ORDER — MIRABEGRON ER 50 MG PO TB24
50.0000 mg | ORAL_TABLET | Freq: Every day | ORAL | Status: DC
  Administered 2021-08-20 – 2021-08-22 (×3): 50 mg via ORAL
  Filled 2021-08-20 (×3): qty 1

## 2021-08-20 MED ORDER — METHYLPREDNISOLONE SODIUM SUCC 125 MG IJ SOLR
90.0000 mg | Freq: Once | INTRAMUSCULAR | Status: AC
  Administered 2021-08-20: 90 mg via INTRAVENOUS
  Filled 2021-08-20: qty 2

## 2021-08-20 MED ORDER — AZITHROMYCIN 250 MG PO TABS
500.0000 mg | ORAL_TABLET | Freq: Every day | ORAL | Status: DC
  Administered 2021-08-21 – 2021-08-22 (×2): 500 mg via ORAL
  Filled 2021-08-20 (×2): qty 2

## 2021-08-20 MED ORDER — PRAVASTATIN SODIUM 20 MG PO TABS
20.0000 mg | ORAL_TABLET | Freq: Every day | ORAL | Status: DC
  Administered 2021-08-20 – 2021-08-21 (×2): 20 mg via ORAL
  Filled 2021-08-20 (×2): qty 1

## 2021-08-20 MED ORDER — QUETIAPINE FUMARATE 25 MG PO TABS
25.0000 mg | ORAL_TABLET | Freq: Every day | ORAL | Status: DC
Start: 2021-08-20 — End: 2021-08-22
  Administered 2021-08-20 – 2021-08-21 (×2): 25 mg via ORAL
  Filled 2021-08-20 (×2): qty 1

## 2021-08-20 MED ORDER — IPRATROPIUM-ALBUTEROL 0.5-2.5 (3) MG/3ML IN SOLN
3.0000 mL | Freq: Four times a day (QID) | RESPIRATORY_TRACT | Status: DC
  Administered 2021-08-20 – 2021-08-22 (×10): 3 mL via RESPIRATORY_TRACT
  Filled 2021-08-20 (×10): qty 3

## 2021-08-20 MED ORDER — APIXABAN 5 MG PO TABS
5.0000 mg | ORAL_TABLET | Freq: Two times a day (BID) | ORAL | Status: DC
  Administered 2021-08-20 – 2021-08-22 (×5): 5 mg via ORAL
  Filled 2021-08-20 (×5): qty 1

## 2021-08-20 MED ORDER — BUSPIRONE HCL 10 MG PO TABS
5.0000 mg | ORAL_TABLET | Freq: Two times a day (BID) | ORAL | Status: DC
  Administered 2021-08-20 – 2021-08-22 (×4): 5 mg via ORAL
  Filled 2021-08-20 (×4): qty 1

## 2021-08-20 MED ORDER — METHOCARBAMOL 500 MG PO TABS: 500.0000 mg | ORAL_TABLET | Freq: Four times a day (QID) | ORAL | Status: DC | PRN

## 2021-08-20 MED ORDER — FLUTICASONE PROPIONATE 50 MCG/ACT NA SUSP
2.0000 | Freq: Every day | NASAL | Status: DC | PRN
Start: 2021-08-20 — End: 2021-08-22

## 2021-08-20 MED ORDER — GUAIFENESIN ER 600 MG PO TB12
600.0000 mg | ORAL_TABLET | Freq: Two times a day (BID) | ORAL | Status: DC
  Administered 2021-08-20 – 2021-08-22 (×5): 600 mg via ORAL
  Filled 2021-08-20 (×5): qty 1

## 2021-08-20 MED ORDER — TORSEMIDE 20 MG PO TABS
20.0000 mg | ORAL_TABLET | Freq: Every day | ORAL | Status: DC
  Administered 2021-08-20 – 2021-08-22 (×3): 20 mg via ORAL
  Filled 2021-08-20 (×3): qty 1

## 2021-08-20 MED ORDER — GABAPENTIN 300 MG PO CAPS
300.0000 mg | ORAL_CAPSULE | Freq: Two times a day (BID) | ORAL | Status: DC
  Administered 2021-08-20 – 2021-08-22 (×4): 300 mg via ORAL
  Filled 2021-08-20 (×4): qty 1

## 2021-08-20 MED ORDER — ACETAMINOPHEN 325 MG PO TABS: 650.0000 mg | ORAL_TABLET | Freq: Four times a day (QID) | ORAL | Status: DC | PRN

## 2021-08-20 MED ORDER — NEBIVOLOL HCL 5 MG PO TABS
5.0000 mg | ORAL_TABLET | Freq: Every day | ORAL | Status: DC
  Administered 2021-08-20 – 2021-08-22 (×3): 5 mg via ORAL
  Filled 2021-08-20 (×3): qty 1

## 2021-08-20 MED ORDER — SODIUM CHLORIDE 0.9 % IV SOLN
500.0000 mg | INTRAVENOUS | Status: AC
  Administered 2021-08-20: 500 mg via INTRAVENOUS
  Filled 2021-08-20: qty 5

## 2021-08-20 MED ORDER — ACETAMINOPHEN 650 MG RE SUPP: 650.0000 mg | Freq: Four times a day (QID) | RECTAL | Status: DC | PRN

## 2021-08-20 MED ORDER — OXYCODONE-ACETAMINOPHEN 5-325 MG PO TABS: 1.0000 | ORAL_TABLET | ORAL | Status: DC | PRN

## 2021-08-20 MED ORDER — ESCITALOPRAM OXALATE 10 MG PO TABS
10.0000 mg | ORAL_TABLET | Freq: Every day | ORAL | Status: DC
  Administered 2021-08-20 – 2021-08-22 (×3): 10 mg via ORAL
  Filled 2021-08-20 (×3): qty 1

## 2021-08-20 MED ORDER — LEFLUNOMIDE 20 MG PO TABS
20.0000 mg | ORAL_TABLET | Freq: Every day | ORAL | Status: DC
  Administered 2021-08-20 – 2021-08-22 (×3): 20 mg via ORAL
  Filled 2021-08-20 (×3): qty 1

## 2021-08-20 MED ORDER — AMLODIPINE BESYLATE 5 MG PO TABS
5.0000 mg | ORAL_TABLET | Freq: Every day | ORAL | Status: DC
  Administered 2021-08-21 – 2021-08-22 (×2): 5 mg via ORAL
  Filled 2021-08-20 (×2): qty 1

## 2021-08-20 MED ORDER — PANTOPRAZOLE SODIUM 40 MG PO TBEC
40.0000 mg | DELAYED_RELEASE_TABLET | Freq: Two times a day (BID) | ORAL | Status: DC
  Administered 2021-08-20 – 2021-08-22 (×4): 40 mg via ORAL
  Filled 2021-08-20 (×4): qty 1

## 2021-08-20 MED ORDER — BUDESONIDE 0.25 MG/2ML IN SUSP
0.2500 mg | Freq: Two times a day (BID) | RESPIRATORY_TRACT | Status: DC
Start: 2021-08-20 — End: 2021-08-22
  Administered 2021-08-20 – 2021-08-22 (×4): 0.25 mg via RESPIRATORY_TRACT
  Filled 2021-08-20 (×4): qty 2

## 2021-08-20 MED ORDER — LIDOCAINE 5 % EX PTCH
1.0000 | MEDICATED_PATCH | CUTANEOUS | Status: DC
Start: 2021-08-20 — End: 2021-08-22
  Administered 2021-08-20 – 2021-08-21 (×2): 1 via TRANSDERMAL
  Filled 2021-08-20 (×2): qty 1

## 2021-08-20 MED ORDER — SUCRALFATE 1 G PO TABS
1.0000 g | ORAL_TABLET | Freq: Two times a day (BID) | ORAL | Status: DC
  Administered 2021-08-20 – 2021-08-22 (×4): 1 g via ORAL
  Filled 2021-08-20 (×4): qty 1

## 2021-08-20 NOTE — ED Notes (Signed)
Granddaughter Faith [phone]

## 2021-08-20 NOTE — H&P (Signed)
History and Physical    Patient: Kerri Carter HTD:[id number] DOB: January 16, 1941 DOA: 08/20/2021 DOS: the patient was seen and examined on 08/20/2021 PCP: McLean-Scocuzza, Nino Glow, MD  Patient coming from: Home  Chief Complaint:  Chief Complaint  Patient presents with   Shortness of Breath   HPI: DAIANNA Carter is a 81 y.o. female with medical history significant of hypertension, COPD, chronic respiratory failure requiring 2 L of nasal cannula oxygen at night, bipolar disorder, PAD, PUD, anxiety, depression, and history of lumbar fusion who presents with progressively worsening shortness of breath and cough over the last week.  She has had a persistent nonproductive cough.  Denies having any significant fevers, chills, chest pain, abdominal pain, nausea, vomiting, diarrhea, blood in stool/urine, or dysuria symptoms.  She has had some mild swelling of her right leg that she just recently noticed an reports some right shoulder pain pain that does not radiate.  She had been using 2 L of oxygen throughout the day due to her symptoms for the last couple days.  Patient had taken 3 DuoNeb nebulizer treatments yesterday without any improvement of symptoms.  She had gone to the point where she was unable to walk even a couple feet without becoming significantly out of breath.  Over the phone her son makes note that he had taken her to her cardiologist office 5 days ago and she seemed to be doing okay still little short of breath at that time and no changes were made to her medication regimen.   She had recently been hospitalized 6/20-6/22 for left lower lobe community-acquired pneumonia treated with Augmentin and was readmitted into the hospital 7/26-2/03 for diastolic CHF along with COPD exacerbation.  Upon admission into the emergency department patient was noted to be afebrile with respiration 14-23, O2 saturations noted as low as 89% with improvement on 2 L of nasal cannula oxygen, and all other vital signs  maintained.  Chest x-ray noted airway thickening suggesting bronchitis or reactive airway disease and healing fractures of the right coracoid base along with right and left rib fractures.  Patient had been given  Solu-Medrol 90 mg IV and multiple breathing treatments.  TRH called to admit.  Review of Systems: As mentioned in the history of present illness. All other systems reviewed and are negative. Past Medical History:  Diagnosis Date   Anxiety    Aortic stenosis 07/09/2015   a.) TTE 07/06/2015: EF 55-60%; mild AS with MPG 13.0 mmHg.   Asthma    Bipolar disorder ([place])    C. difficile diarrhea 2010   a.) following ABX course during hospital admission   Carotid atherosclerosis, bilateral    a.) Moderate; < 50% stenosis BILATERAL ICAs.   Cataract    a.) s/p BILATERAL extraction   Chicken pox    CKD (chronic kidney disease), stage III (HCC)    a. s/p R nephrectomy./ aneurysm   Conversion disorder    COPD (chronic obstructive pulmonary disease) (HCC)    Depression    Essential hypertension    GERD (gastroesophageal reflux disease)    Heart murmur    Hyperlipidemia    ILD (interstitial lung disease) (HCC)    mild; 2/2 RA diagnosis   Inflammatory arthritis    a. hands/carpal tunnel.  b. Low titer rheumatoid factor. c. Negative anti-CCP antibodies. d. Plaquenil.   Macular degeneration    Nocturnal hypoxemia    Non-Obstructive CAD    a. 07/2009 Cath ([facility]): nonobs dzs;  b. 03/2011 Cath [facility]): nonobs  dzs.   Osteoarthritis    a. Knees.   PAD (peripheral artery disease) (HCC)    PUD (peptic ulcer disease)    S/P right hip fracture    11/01/16 s/p repair   Shoulder pain    Sleep apnea    no cpap / minimal   Spinal stenosis at L4-L5 level    severe with L4/L5 anterolisthesis grade 1 anterolisthesis    Toxic maculopathy    Valvular heart disease    a.) TTE 07/06/2015: EF 55-60%; Mild MR/AR/TR; Mild AS with MPG 13.0 mmHg.   Past Surgical History:  Procedure Laterality Date    APPENDECTOMY     APPLICATION OF INTRAOPERATIVE CT SCAN N/A 05/23/2021   Procedure: APPLICATION OF INTRAOPERATIVE CT SCAN;  Surgeon: Meade Maw, MD;  Location: [facility];  Service: Neurosurgery;  Laterality: N/A;   BACK SURGERY     lumbar fusion   BUNIONECTOMY Right    CATARACT EXTRACTION, BILATERAL     CESAREAN SECTION     x1   CHOLECYSTECTOMY N/A 05/11/2016   Procedure: LAPAROSCOPIC CHOLECYSTECTOMY;  Surgeon: Florene Glen, MD;  Location: [facility];  Service: General;  Laterality: N/A;   COLONOSCOPY WITH PROPOFOL N/A 04/02/2016   Procedure: COLONOSCOPY WITH PROPOFOL;  Surgeon: Jonathon Bellows, MD;  Location: [facility];  Service: Endoscopy;  Laterality: N/A;   ENDOSCOPIC RETROGRADE CHOLANGIOPANCREATOGRAPHY (ERCP) WITH PROPOFOL N/A 05/08/2016   Procedure: ENDOSCOPIC RETROGRADE CHOLANGIOPANCREATOGRAPHY (ERCP) WITH PROPOFOL;  Surgeon: Lucilla Lame, MD;  Location: [facility];  Service: Endoscopy;  Laterality: N/A;   ERCP     with biliary spincterotomy 05/08/16 Dr. Allen Norris for choledocholithiasis    ESOPHAGEAL DILATION  04/02/2016   Procedure: ESOPHAGEAL DILATION;  Surgeon: Jonathon Bellows, MD;  Location: [facility];  Service: Endoscopy;;   ESOPHAGOGASTRODUODENOSCOPY (EGD) WITH PROPOFOL N/A 04/02/2016   Procedure: ESOPHAGOGASTRODUODENOSCOPY (EGD) WITH PROPOFOL;  Surgeon: Jonathon Bellows, MD;  Location: [facility];  Service: Endoscopy;  Laterality: N/A;   HIP ARTHROPLASTY Right 11/01/2016   Procedure: ARTHROPLASTY BIPOLAR HIP (HEMIARTHROPLASTY);  Surgeon: Corky Mull, MD;  Location: [facility];  Service: Orthopedics;  Laterality: Right;   LUMBAR LAMINECTOMY/DECOMPRESSION MICRODISCECTOMY N/A 12/11/2020   Procedure: LEFT L2-3 MICRODISCECTOMY, L3-4 AND L5-S1 DECOMPRESSION;  Surgeon: Meade Maw, MD;  Location: [facility];  Service: Neurosurgery;  Laterality: N/A;   NEPHRECTOMY  1988   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   ORIF SCAPHOID FRACTURE Left 12/21/2019    Procedure: OPEN REDUCTION INTERNAL FIXATION (ORIF) OF LEFT SCAPULAR NONUNION WITH BONE GRAFT;  Surgeon: Corky Mull, MD;  Location: [facility];  Service: Orthopedics;  Laterality: Left;   osteoporosis     noted DEXA 08/19/16    REPLACEMENT TOTAL KNEE Right    REVERSE SHOULDER ARTHROPLASTY Right 11/04/2017   Procedure: REVERSE SHOULDER ARTHROPLASTY;  Surgeon: Corky Mull, MD;  Location: [facility];  Service: Orthopedics;  Laterality: Right;   REVERSE SHOULDER ARTHROPLASTY Left 07/26/2018   Procedure: REVERSE SHOULDER ARTHROPLASTY;  Surgeon: Corky Mull, MD;  Location: [facility];  Service: Orthopedics;  Laterality: Left;   TONSILLECTOMY     TOTAL HIP ARTHROPLASTY  12/10/11   [facility] left hip   TOTAL HIP ARTHROPLASTY Bilateral    TRANSFORAMINAL LUMBAR INTERBODY FUSION (TLIF) WITH PEDICLE SCREW FIXATION 3 LEVEL N/A 05/23/2021   Procedure: OPEN L2-4 TRANSFORAMINAL LUMBAR INTERBODY FUSION (TLIF) WITH L2-5 POSTERIOR SPINAL FUSION;  Surgeon: Meade Maw, MD;  Location: [facility];  Service: Neurosurgery;  Laterality: N/A;   TUBAL LIGATION  Social History:  reports that she quit smoking about 47 years ago. Her smoking use included cigarettes. She has a 10.00 pack-year smoking history. She has never used smokeless tobacco. She reports that she does not drink alcohol and does not use drugs.  Allergies  Allergen Reactions   Ceftin [Cefuroxime Axetil] Anaphylaxis   Lisinopril Anaphylaxis   Sulfa Antibiotics Other (See Comments)    Face swelling   Sulfasalazine Anaphylaxis   Morphine Other (See Comments)    Per patient, low blood pressure issues that requires action to raise it back up. Can take small infrequent doses   Xarelto [Rivaroxaban] Other (See Comments)    Stomach burning, bleeding, and tar in stool   Adhesive [Tape] Rash    Paper tape and tega derm OK   Antihistamines, Chlorpheniramine-Type Other (See Comments)    Makes pt hyper   Antivert [Meclizine Hcl] Other (See Comments)     Bladder will not empty   Aspirin Other (See Comments)    Sulfasalazine allergy cross reacts   Contrast Media [Iodinated Contrast Media] Rash    she is able to use betadine scrubs.   Decongestant [Pseudoephedrine Hcl] Other (See Comments)    Makes pt hyper   Doxycycline Other (See Comments)    GI upset   Levaquin [Levofloxacin In D5w] Rash   Polymyxin B Other (See Comments)    Facial rash   Tetanus Toxoids Rash and Other (See Comments)    Fever and hot to touch at injection site    Family History  Problem Relation Age of Onset   Rheum arthritis Mother    Asthma Mother    Parkinson's disease Mother    Heart disease Mother    Stroke Mother    Hypertension Mother    Heart attack Father    Heart disease Father    Hypertension Father    Peripheral Artery Disease Father    Diabetes Son    Gout Son    Asthma Sister    Heart disease Sister    Lung cancer Sister    Heart disease Sister    Heart disease Sister    Breast cancer Sister    Heart attack Sister    Heart disease Brother    Heart disease Maternal Grandmother    Diabetes Maternal Grandmother    Colon cancer Maternal Grandmother    Cancer Maternal Grandmother        Hodgkins lymphoma   Heart disease Brother    Alcohol abuse Brother    Depression Brother    Dementia Son     Prior to Admission medications   Medication Sig Start Date End Date Taking? Authorizing Provider  Adalimumab 40 MG/0.4ML PNKT Inject 40 mg into the skin every 14 (fourteen) days.    [provider]  albuterol (VENTOLIN HFA) 108 (90 Base) MCG/ACT inhaler Inhale 2 puffs into the lungs every 6 (six) hours as needed for wheezing or shortness of breath. 06/24/21   McLean-Scocuzza, Nino Glow, MD  amLODipine (NORVASC) 5 MG tablet Take 1 tablet (5 mg total) by mouth daily. 08/01/21   McLean-Scocuzza, Nino Glow, MD  apixaban (ELIQUIS) 5 MG TABS tablet Take 1 tablet (5 mg total) by mouth 2 (two) times daily. 07/31/21 09/29/21  Ralene Muskrat B, MD   budesonide (PULMICORT) 0.25 MG/2ML nebulizer solution USE 1 VIAL  IN  NEBULIZER TWICE  DAILY - rinse mouth after treatment 08/13/21   [name], MD  busPIRone (BUSPAR) 5 MG tablet Take 1 tablet (5 mg  total) by mouth 2 (two) times daily. 06/02/21   Jennye Boroughs, MD  Cholecalciferol (VITAMIN D3) 50 MCG (2000 UT) TABS Take 4,000 Units by mouth daily. 08/01/21   McLean-Scocuzza, Nino Glow, MD  cyclobenzaprine (FLEXERIL) 10 MG tablet Take 1 tablet (10 mg total) by mouth 3 (three) times daily as needed for muscle spasms. 11/04/20   Kathie Dike, MD  dicyclomine (BENTYL) 10 MG capsule Take 1 capsule (10 mg total) by mouth 2 (two) times daily as needed for spasms. Patient taking differently: Take 20 mg by mouth 2 (two) times daily. 10/01/20 09/26/21  Jonathon Bellows, MD  escitalopram (LEXAPRO) 10 MG tablet Take 1 tablet (10 mg total) by mouth daily. 08/01/21   McLean-Scocuzza, Nino Glow, MD  etodolac (LODINE) 500 MG tablet Take 500 mg by mouth daily. 12/07/20   [provider]  famotidine (PEPCID) 20 MG tablet TAKE 1 TABLET (20 MG TOTAL) BY MOUTH DAILY. BEFORE BREAKFAST OR DINNER 03/24/21   McLean-Scocuzza, Nino Glow, MD  fluticasone (FLONASE) 50 MCG/ACT nasal spray Place 2 sprays into both nostrils daily. In am 12/08/19   McLean-Scocuzza, Nino Glow, MD  gabapentin (NEURONTIN) 300 MG capsule Take 2 capsules (600 mg total) by mouth in the morning and at bedtime. 09/03/20   McLean-Scocuzza, Nino Glow, MD  ipratropium-albuterol (DUONEB) 0.5-2.5 (3) MG/3ML SOLN Take 3 mLs by nebulization 3 (three) times daily as needed. 08/13/21   [name], MD  lamoTRIgine (LAMICTAL) 100 MG tablet TAKE 1 TAB 2 TIMES DAILY. FURTHER REFILLS NEW PSYCH FOR ALL PSYCH MEDS ONLY TEMP SUPPLY FROM PCP 05/02/21   McLean-Scocuzza, Nino Glow, MD  leflunomide (ARAVA) 20 MG tablet Take 1 tablet (20 mg total) by mouth daily. 09/30/17   McLean-Scocuzza, Nino Glow, MD  lidocaine (LIDODERM) 5 % Place 1 patch onto the skin 2 (two) times daily as  needed. Remove & Discard patch within 12 hours or as directed by MD Patient taking differently: Place 1 patch onto the skin daily. Remove & Discard patch within 12 hours or as directed by MD 11/22/20   McLean-Scocuzza, Nino Glow, MD  lovastatin (MEVACOR) 20 MG tablet TAKE 1 TABLET BY MOUTH EVERYDAY AT BEDTIME *STOP TALKING '40MG'$ * 08/01/21   McLean-Scocuzza, Nino Glow, MD  methocarbamol (ROBAXIN) 500 MG tablet Take 500 mg by mouth 4 (four) times daily as needed. 01/23/21   [provider]  mirabegron ER (MYRBETRIQ) 50 MG TB24 tablet Take 1 tablet (50 mg total) by mouth daily. 12/04/20 11/29/21  Vaillancourt, Aldona Bar, PA-C  montelukast (SINGULAIR) 10 MG tablet TAKE 1 TABLET BY MOUTH EVERY DAY 03/24/21   McLean-Scocuzza, Nino Glow, MD  multivitamin-lutein [facility]) CAPS capsule Take 1 capsule by mouth at bedtime.    [provider]  nebivolol (BYSTOLIC) 5 MG tablet Take 1 tablet (5 mg total) by mouth in the morning and at bedtime. (Note dose changed from 1/2 10 mg bid to 5 mg bid) 08/01/21   McLean-Scocuzza, Nino Glow, MD  oxyCODONE-acetaminophen (PERCOCET) 5-325 MG tablet Take 1 tablet by mouth every 4 (four) hours as needed for severe pain. 07/17/21   Harvest Dark, MD  OXYGEN Inhale 2 L into the lungs at bedtime.    [provider]  pantoprazole (PROTONIX) 40 MG tablet TAKE 1 TABLET BY MOUTH 2 TIMES DAILY 30 MIN BEFORE FOOD (NOTE REDUCTION IN FREQUENCY) 07/07/21   McLean-Scocuzza, Nino Glow, MD  QUEtiapine (SEROQUEL) 25 MG tablet TAKE 1 TABLET (25 MG TOTAL) BY MOUTH AT BEDTIME. AGAIN LAST FILL FURTHER REFILLS FROM PSYCHIATRY NO  EXCEPTIONS 09/13/20   McLean-Scocuzza, Nino Glow, MD  sucralfate (CARAFATE) 1 g tablet Take 1 tablet (1 g total) by mouth 2 (two) times daily. 09/17/20 09/12/21  Jonathon Bellows, MD  torsemide (DEMADEX) 20 MG tablet Take 1 tablet (20 mg total) by mouth daily. 08/01/21 08/31/21  Sidney Ace, MD  zoledronic acid (RECLAST) 5 MG/100ML SOLN injection Inject 5 mg  into the vein once.    [provider]    Physical Exam: Vitals:   08/20/21 0750 08/20/21 0800 08/20/21 0815 08/20/21 0830  BP:  (!) 132/53  (!) 127/57  Pulse:  90 86 91  Resp:  20 (!) 23 14  Temp: 98.4 F (36.9 C)     TempSrc: Oral     SpO2:  (!) 89% 91% 92%  Weight:      Height:        Constitutional: Elderly female who appears to be in no acute distress Eyes: PERRL, lids and conjunctivae normal ENMT: Mucous membranes are moist. Posterior pharynx clear of any exudate or lesions. No significant JVD Neck: normal, supple,  Respiratory: Normal respiratory effort with expiratory wheezes and rales appreciated in both lung fields.  O2 saturations currently maintained on 2 L nasal cannula oxygen. Cardiovascular: Regular rate and rhythmNo significant extremity edema. 2+ pedal pulses. No   Abdomen: no tenderness, no masses palpated.   Bowel sounds positive.  Musculoskeletal: no clubbing / cyanosis.  Thoracic kyphosis noted.  Decreased range of motion of the lumbar spine. Skin: no rashes, lesions, ulcers. No induration Neurologic: CN 2-12 grossly intact.  Strength 5/5 in all 4.  Psychiatric: Normal judgment and insight. Alert and oriented x 3. Normal mood.   Data Reviewed:  EKG reveals sinus rhythm at 91 bpm.  Assessment and Plan: Acute on chronic respiratory failure with hypoxia secondary to COPD exacerbation Patient presented with complaints of a nonproductive cough that progressively been worsening with wheezing and shortness of breath with exertion. Noted to have O2 saturations as low as 89% on room air.  Normally patient is only on oxygen 2 L at night, but had been requiring it 24/7 lately.  Chest xray noting bronchial thickening without signs of pneumonia.  Patient had been given multiple breathing treatments and Solu-Medrol 90 mg IV. -Admit to a telemetry bed -Continuous pulse oximetry with nasal cannula oxygen to maintain O2 saturation greater than 92% -DuoNeb breathing  treatments 4 times daily -Budesonide nebs twice daily -Prednisone 40 mg daily -Azithromycin IV -PT/OT consulted to eval and treat -Please encourage patient to follow-up with her pulmonologist Dr. Patsey Berthold in the outpatient setting once able to be discharged  Diastolic congestive heart failure Chronic.  Patient does not appear grossly fluid overloaded at this time.  BNP however was elevated at 410.9, but lower than prior admission on 6/26 for CHF exacerbation. -Strict I&Os and daily weights -Continue home torsemide  Paroxysmal atrial fibrillation on chronic anticoagulation Patient currently appears to be in sinus rhythm.  He was started on Eliquis during the 6/27 hospitalization -Continue beta-blocker and Eliquis    Essential hypertension Blood pressures currently stable.  Home medication regimen includes amlodipine 5 mg daily, Bystolic 5 mg daily, and torsemide 20 mg daily. -Continue current regimen as tolerated  Hypochromic anemia Chronic.  Hemoglobin 8.5 g/dL on admission which appears around patient's baseline which ranges from 8 to 10 g/dL.  Patient did not report any complaints of bleeding. -Continue to monitor  S/p Lumbar fusion Patient had undergone transforaminal lumbar interbody fusion L3/4 and 2/3 and  posterolateral arthrodesis L2-L5 on 05/23/2021. -Oxycodone as needed for pain  Mixed bipolar disorder -Continue BuSpar, Lexapro, Lamictal  Hyperlipidemia -Continue pharmacy substitution of lovastatin or pravastatin  GERD -Continue Protonix, Pepcid, Carafate   Advance Care Planning:   Code Status: DNR confirmed by the patient present at bedside.  Consults: None  Family Communication: Son updated over the phone  Severity of Illness: The appropriate patient status for this patient is INPATIENT. Inpatient status is judged to be reasonable and necessary in order to provide the required intensity of service to ensure the patient's safety. The patient's presenting symptoms,  physical exam findings, and initial radiographic and laboratory data in the context of their chronic comorbidities is felt to place them at high risk for further clinical deterioration. Furthermore, it is not anticipated that the patient will be medically stable for discharge from the hospital within 2 midnights of admission.   * I certify that at the point of admission it is my clinical judgment that the patient will require inpatient hospital care spanning beyond 2 midnights from the point of admission due to high intensity of service, high risk for further deterioration and high frequency of surveillance required.*  Author: Norval Morton, MD 08/20/2021 9:20 AM  For on call review [email or web address].

## 2021-08-20 NOTE — ED Triage Notes (Signed)
C/O cough and SOB x 1 week.  Cough non productive.

## 2021-08-20 NOTE — ED Provider Notes (Signed)
[facility] Provider Note    Event Date/Time   First MD Initiated Contact with Patient 08/20/21 0740     (approximate)   History   Shortness of Breath   HPI  Kerri Carter is a 81 y.o. female with a history of COPD as well as CHF and recent admission to the hospital in the past month for pneumonia not currently on any antibiotics presents to the ER for evaluation of several days of progressively worsening dry cough and shortness of breath.  She does wear home O2 2 L at night and is having to wear it during the day with any exertion.  States that she is having frequent coughing spells but is not coughing up any phlegm.  Denies any abdominal pain no vomiting.  No lower extremity swelling.  Denies any chest pain or pressure.  Has not had any fevers or chills.     Physical Exam   Triage Vital Signs: ED Triage Vitals [08/20/21 0734]  Enc Vitals Group     BP      Pulse      Resp      Temp      Temp src      SpO2      Weight 175 lb (79.4 kg)     Height 5' 1.5" (1.562 m)     Head Circumference      Peak Flow      Pain Score 0     Pain Loc      Pain Edu?      Excl. in [place]?     Most recent vital signs: Vitals:   08/20/21 0815 08/20/21 0830  BP:  (!) 127/57  Pulse: 86 91  Resp: (!) 23 14  Temp:    SpO2: 91% 92%     Constitutional: Alert  Eyes: Conjunctivae are normal.  Head: Atraumatic. Nose: No congestion/rhinnorhea. Mouth/Throat: Mucous membranes are moist.   Neck: Painless ROM.  Cardiovascular:   Good peripheral circulation. Respiratory: tachypnea with prolonged expiratory phase, diffuse wheeze  Gastrointestinal: Soft and nontender.  Musculoskeletal:  no deformity Neurologic:  MAE spontaneously. No gross focal neurologic deficits are appreciated.  Skin:  Skin is warm, dry and intact. No rash noted. Psychiatric: Mood and affect are normal. Speech and behavior are normal.    ED Results / Procedures / Treatments   Labs (all labs  ordered are listed, but only abnormal results are displayed) Labs Reviewed  CBC WITH DIFFERENTIAL/PLATELET - Abnormal; Notable for the following components:      Result Value   WBC 13.1 (*)    RBC 3.67 (*)    Hemoglobin 8.5 (*)    HCT 29.5 (*)    MCH 23.2 (*)    MCHC 28.8 (*)    RDW 16.6 (*)    Lymphs Abs 4.7 (*)    Monocytes Absolute 1.3 (*)    Eosinophils Absolute 1.3 (*)    Abs Immature Granulocytes 0.15 (*)    All other components within normal limits  COMPREHENSIVE METABOLIC PANEL - Abnormal; Notable for the following components:   Glucose, Bld 135 (*)    BUN 26 (*)    Creatinine, Ser 1.52 (*)    Calcium 8.3 (*)    Albumin 3.3 (*)    GFR, Estimated 35 (*)    All other components within normal limits  BRAIN NATRIURETIC PEPTIDE - Abnormal; Notable for the following components:   B Natriuretic Peptide 410.9 (*)    All other  components within normal limits  RESP PANEL BY RT-PCR (FLU A&B, COVID) ARPGX2  BLOOD GAS, VENOUS     EKG  ED ECG REPORT I, Merlyn Lot, the attending physician, personally viewed and interpreted this ECG.   Date: 08/20/2021  EKG Time: 7:45  Rate: 90  Rhythm: sinus  Axis: normal  Intervals:  normal  ST&T Change: no stemi, no depressions    RADIOLOGY Please see ED Course for my review and interpretation.  I personally reviewed all radiographic images ordered to evaluate for the above acute complaints and reviewed radiology reports and findings.  These findings were personally discussed with the patient.  Please see medical record for radiology report.    PROCEDURES:  Critical Care performed: Yes, see critical care procedure note(s)  .Critical Care  Performed by: Merlyn Lot, MD Authorized by: Merlyn Lot, MD   Critical care provider statement:    Critical care time (minutes):  35   Critical care was necessary to treat or prevent imminent or life-threatening deterioration of the following conditions:  Respiratory  failure   Critical care was time spent personally by me on the following activities:  Ordering and performing treatments and interventions, ordering and review of laboratory studies, ordering and review of radiographic studies, pulse oximetry, re-evaluation of patient's condition, review of old charts, obtaining history from patient or surrogate, examination of patient, evaluation of patient's response to treatment, discussions with primary provider, discussions with consultants and development of treatment plan with patient or surrogate    MEDICATIONS ORDERED IN ED: Medications  ipratropium-albuterol (DUONEB) 0.5-2.5 (3) MG/3ML nebulizer solution 3 mL (has no administration in time range)  methylPREDNISolone sodium succinate (SOLU-MEDROL) 125 mg/2 mL injection 90 mg (90 mg Intravenous Given 08/20/21 0854)  ipratropium-albuterol (DUONEB) 0.5-2.5 (3) MG/3ML nebulizer solution 3 mL (3 mLs Nebulization Given 08/20/21 0854)  ipratropium-albuterol (DUONEB) 0.5-2.5 (3) MG/3ML nebulizer solution 3 mL (3 mLs Nebulization Given 08/20/21 0854)     IMPRESSION / MDM / [place] / ED COURSE  I reviewed the triage vital signs and the nursing notes.                              Differential diagnosis includes, but is not limited to, Asthma, copd, CHF, pna, ptx, malignancy, Pe, anemia  Patient presented to the ER for evaluation of symptoms as described above she is afebrile hemodynamically stable with exam as described above.  This presenting complaint could reflect a potentially life-threatening illness therefore the patient will be placed on continuous pulse oximetry and telemetry for monitoring.  Laboratory evaluation will be sent to evaluate for the above complaints.  Will order nebulizer as well as steroid given concern for probable COPD.  Have lower suspicion for CVA at for PE doubt anemia.  Possible pneumonia.  X-ray ordered for the but differential.   Clinical Course as of 08/20/21 0918  Wed  Aug 20, 2021  0851 Chest x-ray on my review and interpretation does not show any evidence of consolidation.  Will await formal radiology report. [PR]  [phone] Patient having desaturations even at rest.  Does feel some improvement after nebulizer treatment based on her hypoxia and evidence of acute bronchitis do feel she will require admission for additional observation and medical treatment.  Have consulted hospitalist for admission. [PR]    Clinical Course User Index [PR] Merlyn Lot, MD     FINAL CLINICAL IMPRESSION(S) / ED DIAGNOSES   Final diagnoses:  Acute  on chronic respiratory failure with hypoxia ([place])  Bronchitis     Rx / DC Orders   ED Discharge Orders     None        Note:  This document was prepared using Dragon voice recognition software and may include unintentional dictation errors.    Merlyn Lot, MD 08/20/21 [phone]

## 2021-08-21 ENCOUNTER — Telehealth: Payer: Self-pay | Admitting: Pulmonary Disease

## 2021-08-21 ENCOUNTER — Encounter: Payer: Medicare PPO | Admitting: Neurosurgery

## 2021-08-21 ENCOUNTER — Inpatient Hospital Stay: Payer: Medicare PPO | Admitting: Internal Medicine

## 2021-08-21 DIAGNOSIS — J441 Chronic obstructive pulmonary disease with (acute) exacerbation: Secondary | ICD-10-CM | POA: Diagnosis not present

## 2021-08-21 LAB — CBC
HCT: 25.3 % — ABNORMAL LOW (ref 36.0–46.0)
Hemoglobin: 7.5 g/dL — ABNORMAL LOW (ref 12.0–15.0)
MCH: 23.1 pg — ABNORMAL LOW (ref 26.0–34.0)
MCHC: 29.6 g/dL — ABNORMAL LOW (ref 30.0–36.0)
MCV: 77.8 fL — ABNORMAL LOW (ref 80.0–100.0)
Platelets: 286 10*3/uL (ref 150–400)
RBC: 3.25 MIL/uL — ABNORMAL LOW (ref 3.87–5.11)
RDW: 16.6 % — ABNORMAL HIGH (ref 11.5–15.5)
WBC: 13.6 10*3/uL — ABNORMAL HIGH (ref 4.0–10.5)
nRBC: 0.1 % (ref 0.0–0.2)

## 2021-08-21 LAB — BASIC METABOLIC PANEL
Anion gap: 8 (ref 5–15)
BUN: 26 mg/dL — ABNORMAL HIGH (ref 8–23)
CO2: 27 mmol/L (ref 22–32)
Calcium: 8.2 mg/dL — ABNORMAL LOW (ref 8.9–10.3)
Chloride: 104 mmol/L (ref 98–111)
Creatinine, Ser: 1.28 mg/dL — ABNORMAL HIGH (ref 0.44–1.00)
GFR, Estimated: 43 mL/min — ABNORMAL LOW (ref >60)
Glucose, Bld: 136 mg/dL — ABNORMAL HIGH (ref 70–99)
Potassium: 3.6 mmol/L (ref 3.5–5.1)
Sodium: 139 mmol/L (ref 135–145)

## 2021-08-21 MED ORDER — GUAIFENESIN-DM 100-10 MG/5ML PO SYRP
10.0000 mL | ORAL_SOLUTION | Freq: Once | ORAL | Status: DC
  Filled 2021-08-21: qty 10

## 2021-08-21 MED ORDER — TORSEMIDE 20 MG PO TABS
20.0000 mg | ORAL_TABLET | Freq: Once | ORAL | Status: AC
Start: 2021-08-21 — End: 2021-08-21
  Administered 2021-08-21: 20 mg via ORAL
  Filled 2021-08-21: qty 1

## 2021-08-21 NOTE — Care Management Important Message (Signed)
Important Message  Patient Details  Name: KAIDENCE SANT MRN: [id number] Date of Birth: Sep 19, 1940   Medicare Important Message Given:  N/A - LOS <3 / Initial given by admissions     Dannette Barbara 08/21/2021, 2:20 PM

## 2021-08-21 NOTE — Evaluation (Signed)
Occupational Therapy Evaluation Patient Details Name: Kerri Carter MRN: [id number] DOB: May 21, 1940 Today's Date: 08/21/2021   History of Present Illness presented to ER secondary to progressive SOB, cough; admitted for management of acute/chronic respiratory failure due to acute COPD exacerbation   Clinical Impression   Pt up in chair upon OT arrival.   Agreeable to OT evaluation.  Tolerating room air well since PT eval earlier.  Pt resting at 92% on room air upon OT arrival, but actually maintained at 94-98% with activity.  Pt tolerated short standing episodes for grooming at sink and seated UB wash up with set up at EOB.  Pt managed functional mobility and transfers with min guard, able to tolerate side stepping with cues for technique to maneuver walker between bed and sink.  A couple slight minor LOB during sit to stand and side stepping, but recovered with no more than min guard and walker use.  Frequent rest breaks needed and cues to minimize talking when SOB, but otherwise pt seems mostly aware of most EC strategies, as she states she sits and uses her rollator at home for most ADLs.  Pt was actively getting [facility]/PT/aid services in the home and recommend returning to this upon hospital discharge.  Pt will benefit from acute OT during hospitalization to reinforce EC and maximize activity tolerance to work towards return to [facility] with self care.  Pt in agreement with plan.    Recommendations for follow up therapy are one component of a multi-disciplinary discharge planning process, led by the attending physician.  Recommendations may be updated based on patient status, additional functional criteria and insurance authorization.   Follow Up Recommendations  Home health OT    Assistance Recommended at Discharge Intermittent Supervision/Assistance  Patient can return home with the following A little help with bathing/dressing/bathroom;Assistance with cooking/housework;A little help with walking  and/or transfers    Functional Status Assessment  Patient has had a recent decline in their functional status and demonstrates the ability to make significant improvements in function in a reasonable and predictable amount of time.  Equipment Recommendations  None recommended by OT    Recommendations for Other Services       Precautions / Restrictions Precautions Precautions: Fall;Back Precaution Comments: hx of lumbar fusion 05/23/21 Restrictions Weight Bearing Restrictions: No      Mobility Bed Mobility                 Patient Response: Cooperative  Transfers Overall transfer level: Needs assistance Equipment used: Rolling walker (2 wheels) Transfers: Sit to/from Stand Sit to Stand: Min guard           General transfer comment: slight uncontrolled descent as pt tends to sit in a hurry from being fatigued/SOB      Balance Overall balance assessment: Needs assistance Sitting-balance support: No upper extremity supported, Feet supported Sitting balance-Leahy Scale: Good     Standing balance support: Bilateral upper extremity supported Standing balance-Leahy Scale: Fair Standing balance comment: 1 hand support to perform ADLs in standing.             High level balance activites: Side stepping High Level Balance Comments: min guard to side step at EOB between sink and bed with RW for support           ADL either performed or assessed with clinical judgement   ADL Overall ADL's : Needs assistance/impaired     Grooming: Brushing hair;Minimal assistance;Standing Grooming Details (indicate cue type and reason): min A  to reach L side of head; min guard and L hand on countertop for support. Upper Body Bathing: Set up;Sitting       Upper Body Dressing : Set up;Sitting Upper Body Dressing Details (indicate cue type and reason): hospital gown change                 Functional mobility during ADLs: Min guard;Rolling walker (2 wheels) General  ADL Comments: Frequent rest breaks, cues to minimize talking when SOB     Vision Patient Visual Report: No change from baseline                  Pertinent Vitals/Pain Pain Assessment Pain Assessment: Faces Faces Pain Scale: Hurts little more Pain Location: L shoulder (intermittent pains throughout session) Pain Descriptors / Indicators: Shooting, Grimacing Pain Intervention(s): Monitored during session, Repositioned     Hand Dominance Right   Extremity/Trunk Assessment Upper Extremity Assessment Upper Extremity Assessment: Overall WFL for tasks assessed;LUE deficits/detail RUE Deficits / Details: Hx of failed L shoulder ORIF.  Pt states loose hardware.  Pt has passive L shoulder flexion ~0-100, no active; RUE WFL and good bilat bicep/tricep strength for functional transfers and ADLs.   Lower Extremity Assessment Lower Extremity Assessment: Defer to PT evaluation;Overall [facility] for tasks assessed       Communication Communication Communication: No difficulties   Cognition Arousal/Alertness: Awake/alert Behavior During Therapy: WFL for tasks assessed/performed Overall Cognitive Status: Within Functional Limits for tasks assessed                                 General Comments: pleasant, cooperative, A&Ox4     General Comments       Exercises Other Exercises Other Exercises: Reviewed EC strategies, ensuring frequent rest breaks, avoid excessive talking when SOB         Home Living Family/patient expects to be discharged to:: Private residence Living Arrangements: Other relatives (lives with grandson, his spouse, their 62 y/o son, and a baby due in Aug.) Available Help at Discharge: Family;Available PRN/intermittently Type of Home: House Home Access: Ramped entrance     Home Layout: One level     Bathroom Shower/Tub: Occupational psychologist: Handicapped height     Home Equipment: Rollator (4 wheels);BSC/3in1;Shower seat;Toilet  riser;Grab bars - toilet;Hand held shower head;Grab bars - tub/shower;Adaptive equipment;Transport chair;Other (comment) (lift chair and electric single bed (head and foot up/down)) Adaptive Equipment: Reacher Additional Comments: Pt has a twin size bed that elevates at [facility] adn FOB. Pt lives with grandchildren who work during the day.      Prior Functioning/Environment               Mobility Comments: Per PT, pt states she does not typically walk more than 30-40 ft room to room but can occasionally get out of the home with family; home O2 at night and as needed during the day (2L).  Was active with HHPT and has aide support (1x/week) ADLs Comments: Pt sink bathes with modified indep, and has assist from [facility] 1x a week for a shower.  Pt can manage basic self care with frequent rest breaks and use of rollator.  Grandson provides set up for breakfast.  Pt can microwave a meal while seated on rollator, and family assists with dinner.        OT Problem List: Decreased activity tolerance;Cardiopulmonary status limiting activity;Impaired balance (sitting and/or standing)  OT Treatment/Interventions: Self-care/ADL training;Energy conservation;Balance training;Therapeutic exercise;Therapeutic activities;Patient/family education    OT Goals(Current goals can be found in the care plan section) Acute Rehab OT Goals Patient Stated Goal: To feel better and go home OT Goal Formulation: With patient Time For Goal Achievement: 09/04/21 Potential to Achieve Goals: Good  OT Frequency: Min 2X/week                  AM-PAC OT "6 Clicks" Daily Activity     Outcome Measure Help from another person eating meals?: None Help from another person taking care of personal grooming?: A Little Help from another person toileting, which includes using toliet, bedpan, or urinal?: A Little Help from another person bathing (including washing, rinsing, drying)?: A Little Help from another person to put on  and taking off regular upper body clothing?: A Little Help from another person to put on and taking off regular lower body clothing?: A Little 6 Click Score: 19   End of Session Equipment Utilized During Treatment: Rolling walker (2 wheels) Nurse Communication: Mobility status  Activity Tolerance: Patient tolerated treatment well Patient left: in chair;with call bell/phone within reach  OT Visit Diagnosis: Unsteadiness on feet (R26.81);Muscle weakness (generalized) (M62.81)                Time: 5993-5701 OT Time Calculation (min): 43 min Charges:  OT General Charges $OT Visit: 1 Visit OT Evaluation $OT Eval Moderate Complexity: 1 Mod OT Treatments $Self Care/Home Management : 8-22 mins  Leta Speller, MS, OTR/L   Darleene Cleaver 08/21/2021, 1:03 PM

## 2021-08-21 NOTE — Progress Notes (Signed)
PROGRESS NOTE    Kerri Carter  XTK:[id number] DOB: [birth year] DOA: 08/20/2021 PCP: McLean-Scocuzza, Nino Glow, MD    Brief Narrative:  Kerri Carter is a 81 y.o. female with medical history significant of hypertension, COPD, chronic respiratory failure requiring 2 L of nasal cannula oxygen at night, bipolar disorder, PAD, PUD, anxiety, depression, and history of lumbar fusion who presents with progressively worsening shortness of breath and cough over the last week.  She has had a persistent nonproductive cough.  Denies having any significant fevers, chills, chest pain, abdominal pain, nausea, vomiting, diarrhea, blood in stool/urine, or dysuria symptoms.  She has had some mild swelling of her right leg that she just recently noticed an reports some right shoulder pain pain that does not radiate.  She had been using 2 L of oxygen throughout the day due to her symptoms for the last couple days.  Patient had taken 3 DuoNeb nebulizer treatments yesterday without any improvement of symptoms.  She had gone to the point where she was unable to walk even a couple feet without becoming significantly out of breath.  Over the phone her son makes note that he had taken her to her cardiologist office 5 days ago and she seemed to be doing okay still little short of breath at that time and no changes were made to her medication regimen.  7/20 feeling better but still with some sob , not quiet at baseline  Consultants:    Procedures:   Antimicrobials:  azithromycin   Subjective: No cp, or any other sx  Objective: Vitals:   08/21/21 0409 08/21/21 0740 08/21/21 1016 08/21/21 1617  BP: (!) 117/59  (!) 145/58 (!) 115/52  Pulse: 86  94 89  Resp: 20  (!) 22 18  Temp: 98.3 F (36.8 C)  98.1 F (36.7 C) 98.3 F (36.8 C)  TempSrc:      SpO2: 96% 98% 97% 95%  Weight:      Height:        Intake/Output Summary (Last 24 hours) at 08/21/2021 1757 Last data filed at 08/21/2021 1015 Gross per 24 hour   Intake 420 ml  Output --  Net 420 ml   Filed Weights   08/20/21 0734  Weight: 79.4 kg    Examination: Calm, NAD Scattered dry crackles at bases, clears with cough. No wheezing. Reg s1/s2 no gallop Soft benign +bs No edema Aaoxox3  Mood and affect appropriate in current setting     Data Reviewed: I have personally reviewed following labs and imaging studies  CBC: Recent Labs  Lab 08/20/21 0748 08/21/21 0500  WBC 13.1* 13.6*  NEUTROABS 5.6  --   HGB 8.5* 7.5*  HCT 29.5* 25.3*  MCV 80.4 77.8*  PLT 327 992   Basic Metabolic Panel: Recent Labs  Lab 08/20/21 0748 08/21/21 0500  NA 141 139  K 3.5 3.6  CL 107 104  CO2 26 27  GLUCOSE 135* 136*  BUN 26* 26*  CREATININE 1.52* 1.28*  CALCIUM 8.3* 8.2*   GFR: Estimated Creatinine Clearance: 35 mL/min (A) (by C-G formula based on SCr of 1.28 mg/dL (H)). Liver Function Tests: Recent Labs  Lab 08/20/21 0748  AST 25  ALT 15  ALKPHOS 104  BILITOT 0.4  PROT 6.7  ALBUMIN 3.3*   No results for input(s): "LIPASE", "AMYLASE" in the last 168 hours. No results for input(s): "AMMONIA" in the last 168 hours. Coagulation Profile: No results for input(s): "INR", "PROTIME" in the last 168 hours. Cardiac  Enzymes: No results for input(s): "CKTOTAL", "CKMB", "CKMBINDEX", "TROPONINI" in the last 168 hours. BNP (last 3 results) No results for input(s): "PROBNP" in the last 8760 hours. HbA1C: No results for input(s): "HGBA1C" in the last 72 hours. CBG: No results for input(s): "GLUCAP" in the last 168 hours. Lipid Profile: No results for input(s): "CHOL", "HDL", "LDLCALC", "TRIG", "CHOLHDL", "LDLDIRECT" in the last 72 hours. Thyroid Function Tests: No results for input(s): "TSH", "T4TOTAL", "FREET4", "T3FREE", "THYROIDAB" in the last 72 hours. Anemia Panel: No results for input(s): "VITAMINB12", "FOLATE", "FERRITIN", "TIBC", "IRON", "RETICCTPCT" in the last 72 hours. Sepsis Labs: Recent Labs  Lab 08/20/21 0748   PROCALCITON <0.10    Recent Results (from the past 240 hour(s))  Resp Panel by RT-PCR (Flu A&B, Covid) Anterior Nasal Swab     Status: None   Collection Time: 08/20/21  7:48 AM   Specimen: Anterior Nasal Swab  Result Value Ref Range Status   SARS Coronavirus 2 by RT PCR NEGATIVE NEGATIVE Final    Comment: (NOTE) SARS-CoV-2 target nucleic acids are NOT DETECTED.  The SARS-CoV-2 RNA is generally detectable in upper respiratory specimens during the acute phase of infection. The lowest concentration of SARS-CoV-2 viral copies this assay can detect is 138 copies/mL. A negative result does not preclude SARS-Cov-2 infection and should not be used as the sole basis for treatment or other patient management decisions. A negative result may occur with  improper specimen collection/handling, submission of specimen other than nasopharyngeal swab, presence of viral mutation(s) within the areas targeted by this assay, and inadequate number of viral copies(<138 copies/mL). A negative result must be combined with clinical observations, patient history, and epidemiological information. The expected result is Negative.  Fact Sheet for Patients:  EntrepreneurPulse.com.au  Fact Sheet for Healthcare Providers:  IncredibleEmployment.be  This test is no t yet approved or cleared by the [place] FDA and  has been authorized for detection and/or diagnosis of SARS-CoV-2 by FDA under an Emergency Use Authorization (EUA). This EUA will remain  in effect (meaning this test can be used) for the duration of the COVID-19 declaration under Section 564(b)(1) of the Act, 21 U.S.C.section 360bbb-3(b)(1), unless the authorization is terminated  or revoked sooner.       Influenza A by PCR NEGATIVE NEGATIVE Final   Influenza B by PCR NEGATIVE NEGATIVE Final    Comment: (NOTE) The Xpert Xpress SARS-CoV-2/FLU/RSV plus assay is intended as an aid in the diagnosis of  influenza from Nasopharyngeal swab specimens and should not be used as a sole basis for treatment. Nasal washings and aspirates are unacceptable for Xpert Xpress SARS-CoV-2/FLU/RSV testing.  Fact Sheet for Patients: EntrepreneurPulse.com.au  Fact Sheet for Healthcare Providers: IncredibleEmployment.be  This test is not yet approved or cleared by the [place] FDA and has been authorized for detection and/or diagnosis of SARS-CoV-2 by FDA under an Emergency Use Authorization (EUA). This EUA will remain in effect (meaning this test can be used) for the duration of the COVID-19 declaration under Section 564(b)(1) of the Act, 21 U.S.C. section 360bbb-3(b)(1), unless the authorization is terminated or revoked.  Performed at [facility], [address], [place], [place] [address]          Radiology Studies: DG Chest Portable 1 View  Result Date: 08/20/2021 CLINICAL DATA:  Cough.  Shortness of breath for 1 week. EXAM: PORTABLE CHEST 1 VIEW COMPARISON:  07/28/2021 FINDINGS: Airway thickening is present, suggesting bronchitis or reactive airways disease. Healing fractures the right posterolateral fifth, sixth,  and seventh ribs with callus formation noted. There also anterior healing fractures of the right fourth and fifth ribs. Late phase healing fracture the left posterolateral third rib. Review of current in recent prior exams reveals a healing fracture at the base of the right coracoid. Heart size within normal limits for AP projection. No blunting of the costophrenic angles. IMPRESSION: 1. Airway thickening is present, suggesting bronchitis or reactive airways disease. 2. Healing fracture the right coracoid base. Late phase healing fractures of right and left ribs. Electronically Signed   By: Van Clines M.D.   On: 08/20/2021 09:01        Scheduled Meds:  amLODipine  5 mg Oral Daily   apixaban  5 mg Oral BID   azithromycin   500 mg Oral Daily   budesonide  0.25 mg Nebulization BID   busPIRone  5 mg Oral BID   escitalopram  10 mg Oral Daily   famotidine  20 mg Oral Daily   gabapentin  300 mg Oral BID   guaiFENesin  600 mg Oral BID   guaiFENesin-dextromethorphan  10 mL Oral Once   ipratropium-albuterol  3 mL Nebulization QID   lamoTRIgine  100 mg Oral BID   leflunomide  20 mg Oral Daily   lidocaine  1 patch Transdermal Q24H   mirabegron ER  50 mg Oral Daily   montelukast  10 mg Oral Daily   nebivolol  5 mg Oral Daily   pantoprazole  40 mg Oral BID   pravastatin  20 mg Oral q1800   predniSONE  40 mg Oral Q breakfast   QUEtiapine  25 mg Oral QHS   sodium chloride flush  3 mL Intravenous Q12H   sucralfate  1 g Oral BID   torsemide  20 mg Oral Daily   Continuous Infusions:  Assessment & Plan:   Principal Problem:   COPD with exacerbation ([place]) Active Problems:   Acute on chronic respiratory failure with hypoxia (HCC)   Chronic diastolic CHF (congestive heart failure) (HCC)   Paroxysmal atrial fibrillation (HCC)   Essential hypertension   Hypochromic anemia   S/P lumbar fusion   Mixed bipolar I disorder (HCC)   Hyperlipidemia   Gastroesophageal reflux disease   Acute on chronic respiratory failure with hypoxia secondary to COPD exacerbation Improving slowly Continue iv abx Continue steroid Duonebs Ambulate as tolerated   Diastolic congestive heart failure Chronic.  Patient does not appear grossly fluid overloaded at this time.  BNP however was elevated at 410.9, but lower than prior admission on 6/26 for CHF exacerbation. 7/20 will add one extra dose of torsemid '20mg'$  x1   Paroxysmal atrial fibrillation on chronic anticoagulation Patient currently appears to be in sinus rhythm.  He was started on Eliquis during the 6/27 hospitalization Continue beta blk and eliquis   Essential hypertension Stable on current mx   Hypochromic anemia Chronic.  Hemoglobin 8.5 g/dL on admission which  appears around patient's baseline which ranges from 8 to 10 g/dL.  Patient did not report any complaints of bleeding. -Continue to monitor   S/p Lumbar fusion Patient had undergone transforaminal lumbar interbody fusion L3/4 and 2/3 and posterolateral arthrodesis L2-L5 on 05/23/2021. -Oxycodone as needed for pain   Mixed bipolar disorder -Continue BuSpar, Lexapro, Lamictal   Hyperlipidemia -Continue pharmacy substitution of lovastatin or pravastatin   GERD -Continue Protonix, Pepcid, Carafate   DVT prophylaxis: eliquis Code Status:DNR Family Communication: none  Disposition Plan: back home Status is: Inpatient Remains inpatient appropriate because: IV treatment.  LOS: 1 day   Time spent: 58 min    Nolberto Hanlon, MD [facility] Pager [phone]  If 7PM-7AM, please contact night-coverage 08/21/2021, 5:57 PM

## 2021-08-21 NOTE — Evaluation (Signed)
Physical Therapy Evaluation Patient Details Name: LANELLE LINDO MRN: [id number] DOB: Dec 03, 1940 Today's Date: 08/21/2021  History of Present Illness  presented to ER secondary to progressive SOB, cough; admitted for management of acute/chronic respiratory failure due to acute COPD exacerbation  Clinical Impression  Patient resting in bed upon arrival to session; alert and oriented, agreeable to [place] activities.  Endorses "feeling better" since admission with noted improvement in coughing and overall respiratory status.  Denies pain at this time.  Bilat UE/LE strength and ROM grossly symmetrical and WFL for basic transfers and gait; no focal weakness appreciated.  Does display some mild foot drop to bilat feet (R > L); may benefit from trial of AFO in the future.  Able to complete bed mobility with supervision; sit/stand, basic tranfsers and gait (20') with RW, cga/min assist.  Demonstrates broad BOS with excessive bilat LE ER throughout gait cycle; mild foot drop (R > L).  Mod SOB with minimal distance (unable to tolerate additional distance as result); sats >95% on RA throughout. Of note, patient on 2L O2 beginning of session, sats 98%.  Trialed on RA (per RN), maintaining sats >94% on RA at rest and with activity throughout session.  Left on RA end of session; RN informed/aware. Would benefit from skilled PT to address above deficits and promote optimal return to PLOF.;Recommend transition to HHPT upon discharge from acute hospitalization.      Recommendations for follow up therapy are one component of a multi-disciplinary discharge planning process, led by the attending physician.  Recommendations may be updated based on patient status, additional functional criteria and insurance authorization.  Follow Up Recommendations Home health PT Can patient physically be transported by private vehicle: Yes    Assistance Recommended at Discharge Intermittent Supervision/Assistance  Patient can return  home with the following  A little help with bathing/dressing/bathroom;Assistance with cooking/housework;Assist for transportation;Direct supervision/assist for medications management;Help with stairs or ramp for entrance;A little help with walking and/or transfers    Equipment Recommendations None recommended by PT  Recommendations for Other Services       Functional Status Assessment Patient has had a recent decline in their functional status and demonstrates the ability to make significant improvements in function in a reasonable and predictable amount of time.     Precautions / Restrictions Precautions Precautions: Back;Fall Precaution Comments: hx of lumbar fusion 05/23/21      Mobility  Bed Mobility Overal bed mobility: Needs Assistance Bed Mobility: Supine to Sit     Supine to sit: Supervision     General bed mobility comments: increased time and effort, heavy use of bedrails to complete    Transfers Overall transfer level: Needs assistance Equipment used: Rolling walker (2 wheels) Transfers: Sit to/from Stand Sit to Stand: Min guard           General transfer comment: tends to pull on RW despite cuing    Ambulation/Gait Ambulation/Gait assistance: Min guard, Min assist Gait Distance (Feet): 20 Feet Assistive device: Rolling walker (2 wheels)         General Gait Details: broad BOS with excessive bilat LE ER throughout gait cycle; mild foot drop (R > L).  Mod SOB with minimal distance (unable to tolerate additional distance as result); sats >95% on RA throughout  Stairs            Wheelchair Mobility    Modified Rankin (Stroke Patients Only)       Balance Overall balance assessment: Needs assistance Sitting-balance support: No upper extremity  supported, Feet supported Sitting balance-Leahy Scale: Good     Standing balance support: Bilateral upper extremity supported Standing balance-Leahy Scale: Fair                                Pertinent Vitals/Pain Pain Assessment Pain Assessment: No/denies pain    Home Living Family/patient expects to be discharged to:: Private residence Living Arrangements: Other relatives Available Help at Discharge: Family;Available PRN/intermittently Type of Home: House Home Access: Ramped entrance       Home Layout: One level Home Equipment: Rollator (4 wheels);BSC/3in1;Shower seat;Toilet riser;Grab bars - toilet;Hand held shower head;Grab bars - tub/shower;Adaptive equipment;Transport chair Additional Comments: Pt has a twin size bed that elevates at [facility] adn FOB. Pt lives with grandchildren who work during the day.    Prior Function               Mobility Comments: Pt states she does not typically walk more than 30-40 ft room to room but can occasionally get out of the home with family; home O2 at night and as needed during the day (2L).  Was active with HHPT and has aide support (1x/week)       Hand Dominance   Dominant Hand: Right    Extremity/Trunk Assessment   Upper Extremity Assessment Upper Extremity Assessment: Overall WFL for tasks assessed    Lower Extremity Assessment Lower Extremity Assessment: Overall WFL for tasks assessed (grossly 4/5 throughout; no focal weakness; limited bilat ankle DF (R > L))       Communication      Cognition Arousal/Alertness: Awake/alert Behavior During Therapy: WFL for tasks assessed/performed Overall Cognitive Status: Within Functional Limits for tasks assessed                                          General Comments      Exercises Other Exercises Other Exercises: Reviewed role of PT and progressive mobility, importance of activity pacing and pursed lip breathing; patient voiced understanding.   Assessment/Plan    PT Assessment Patient needs continued PT services  PT Problem List Decreased strength;Decreased activity tolerance;Decreased range of motion;Decreased balance;Decreased  mobility;Decreased knowledge of use of DME;Decreased safety awareness;Cardiopulmonary status limiting activity       PT Treatment Interventions DME instruction;Gait training;Stair training;Functional mobility training;Therapeutic activities;Therapeutic exercise;Balance training;Patient/family education    PT Goals (Current goals can be found in the Care Plan section)  Acute Rehab PT Goals Patient Stated Goal: to return home PT Goal Formulation: With patient Time For Goal Achievement: 09/04/21 Potential to Achieve Goals: Good    Frequency Min 2X/week     Co-evaluation               AM-PAC PT "6 Clicks" Mobility  Outcome Measure Help needed turning from your back to your side while in a flat bed without using bedrails?: None Help needed moving from lying on your back to sitting on the side of a flat bed without using bedrails?: None Help needed moving to and from a bed to a chair (including a wheelchair)?: A Little Help needed standing up from a chair using your arms (e.g., wheelchair or bedside chair)?: A Little Help needed to walk in hospital room?: A Little Help needed climbing 3-5 steps with a railing? : A Little 6 Click Score: 20    End of  Session Equipment Utilized During Treatment: Gait belt Activity Tolerance: Patient tolerated treatment well Patient left: in chair;with chair alarm set (RN at bedside for AM assessment/meds; to connect alarm and set chair once assessment complete) Nurse Communication: Mobility status PT Visit Diagnosis: Difficulty in walking, not elsewhere classified (R26.2);Muscle weakness (generalized) (M62.81)    Time: 0919-8022 PT Time Calculation (min) (ACUTE ONLY): 24 min   Charges:   PT Evaluation $PT Eval Moderate Complexity: 1 Mod PT Treatments $Therapeutic Activity: 8-22 mins        Takasha Vetere H. Owens Shark, PT, DPT, NCS 08/21/21, 10:47 AM [phone]

## 2021-08-21 NOTE — Telephone Encounter (Signed)
Spoke to patient.  She wanted to make Dr. Patsey Berthold aware that she is currently admitted at [facility].  She is aware that Dr. Patsey Berthold is out of the office until 08/25/2021. Advised patient that there is an on call pulmonologist if attending provider feels that she needed to be consulted by pulmonary.  She voiced her understanding.   Routing to Dr. Patsey Berthold as an [place].

## 2021-08-21 NOTE — TOC Initial Note (Signed)
Transition of Care [facility]) - Initial/Assessment Note    Patient Details  Name: Kerri Carter MRN: [id number] Date of Birth: 05/12/1940  Transition of Care [facility]) CM/SW Contact:    Beverly Sessions, RN Phone Number: 08/21/2021, 4:47 PM  Clinical Narrative:                   Patient admitted for COPD Patient lives with her grandson, his wife, and his children. Family provides transportation. PCP is Dr. Terese Door. [facility]. Patient has oxygen, a lift chair, a rollator, and a shower chair  Son or grandson to transport at discharge  Open with [facility] for PT and OT.  [place] notified of admission.  Requested to add RN  Expected Discharge Plan: [place] Barriers to Discharge: Continued Medical Work up   Patient Goals and CMS Choice   CMS Medicare.gov Compare Post Acute Care list provided to:: Patient    Expected Discharge Plan and Services Expected Discharge Plan: [place]       Living arrangements for the past 2 months: Single Family Home                                      Prior Living Arrangements/Services Living arrangements for the past 2 months: Single Family Home Lives with:: Adult Children              Current home services: DME    Activities of Daily Living Home Assistive Devices/Equipment: Environmental consultant (specify type) ADL Screening (condition at time of admission) Patient's cognitive ability adequate to safely complete daily activities?: Yes Is the patient deaf or have difficulty hearing?: No Does the patient have difficulty seeing, even when wearing glasses/contacts?: No Does the patient have difficulty concentrating, remembering, or making decisions?: No Patient able to express need for assistance with ADLs?: Yes Does the patient have difficulty dressing or bathing?: Yes Independently performs ADLs?: No  Permission Sought/Granted                  Emotional Assessment               Admission diagnosis:  Bronchitis [J40] COPD with exacerbation ([place]) [J44.1] Acute on chronic respiratory failure with hypoxia ([place]) [J96.21] Patient Active Problem List   Diagnosis Date Noted   COPD with exacerbation ([place]) 08/20/2021   Chronic diastolic CHF (congestive heart failure) ([place]) 08/20/2021   Paroxysmal atrial fibrillation ([place]) 07/29/2021   Acute on chronic combined systolic (congestive) and diastolic (congestive) heart failure ([place]) 07/28/2021   Stage 3a chronic kidney disease (CKD) ([place]) 07/23/2021   CAP (community acquired pneumonia) 07/23/2021   Community acquired pneumonia 07/22/2021   Sepsis due to pneumonia ([place]) 07/22/2021   Lymphocytosis 07/13/2021   Thrombocytosis 06/24/2021   Leukopenia 06/24/2021   Abnormal thyroid function test 06/24/2021   Acute on chronic combined systolic and diastolic CHF (congestive heart failure) ([place]) 05/26/2021   Left lower lobe pneumonia 05/26/2021   Elevated d-dimer 05/26/2021   Acute kidney injury superimposed on chronic kidney disease ([place]) 05/26/2021   Hyperkalemia 05/26/2021   Anxiety and depression 05/26/2021   S/P lumbar fusion 05/23/2021   Physical deconditioning 02/27/2021   Leg pain, bilateral    Respiratory failure with hypoxia ([place]) 01/12/2021   Acute on chronic respiratory failure with hypoxia ([place]) 01/12/2021   Frequent falls 12/18/2020   Hip pain 12/18/2020   Acute pain  of left knee 12/18/2020   Elevated brain natriuretic peptide (BNP) level 12/18/2020   Weakness of both lower extremities 12/18/2020   Assistance needed for mobility 12/18/2020   Impaired mobility 12/18/2020   Urinary incontinence 12/18/2020   Pleural effusion on left 12/18/2020   Lumbar radiculopathy 12/11/2020   COPD exacerbation ([place]) 10/29/2020   Acromial process of scapula fracture 12/21/2019   NSIP (nonspecific interstitial pneumonitis) ([place]) 12/08/2019   Iliopsoas bursitis of right hip 08/29/2019   Chronic left shoulder pain 08/29/2019    Overweight (BMI 25.0-29.9) 08/29/2019   Hypertriglyceridemia 07/24/2019   Bipolar disorder, in full remission, most recent episode depressed ([place]) 07/13/2019   Essential hypertension 06/16/2019   Chronic pain 06/16/2019   Lymphedema 06/05/2019   Chronic venous insufficiency 05/25/2019   PAD (peripheral artery disease) ([place]) 05/25/2019   Leg edema 05/02/2019   Episodic mood disorder ([place]) 81/82/9937   Complicated grief 16/96/7893   At high risk for falls 03/16/2019   Pelvic mass in female 03/16/2019   Compression fracture of L4 vertebra ([place]) 02/21/2019   Collapsed vertebra, not elsewhere classified, lumbar region, initial encounter for fracture ([place]) 02/21/2019   Anxiety 01/20/2019   Pain due to onychomycosis of toenails of both feet 12/05/2018   Hav (hallux abducto valgus), unspecified laterality 12/05/2018   Closed fracture of acromial process of scapula 10/28/2018   Status post reverse arthroplasty of shoulder, left 07/26/2018   Interstitial pulmonary disease ([place]) 07/07/2018   Fatigue 07/07/2018   Avascular necrosis of left shoulder due to adverse effect of steroid therapy ([place]) 01/20/2018   Other shoulder lesions, left shoulder 12/20/2017   Status post reverse total shoulder replacement, right 11/04/2017   Leukocytosis 10/19/2017   Allergic rhinitis 09/28/2017   Rotator cuff tendinitis, right 07/26/2017   Strain of right hip 02/26/2017   History of fracture of left hip 01/15/2017   Status post lumbar spinal fusion 01/15/2017   Dislocation of hip joint prosthesis ([place]) 01/08/2017   Status post hip hemiarthroplasty 01/08/2017   Leg swelling 12/15/2016   Age-related osteoporosis without current pathological fracture 11/05/2016   Hip fracture ([place]) 10/31/2016   Peripheral neuropathy 10/28/2016   Left foot pain 10/19/2016   Spinal stenosis of lumbar region 09/15/2016   Osteoporosis 08/27/2016   Drug-induced osteoporosis 08/27/2016   Adnexal mass 08/25/2016   Prediabetes  08/25/2016   Coronary atherosclerosis 08/25/2016   History of syncope 08/25/2016   Hypokalemia 08/25/2016   Mixed bipolar I disorder ([place]) 10/09/2015   Stage 3b chronic kidney disease ([place])    Hypochromic anemia 08/15/2015   Conversion disorder 08/04/2015   Hyperlipidemia 07/17/2015   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   Macular degeneration 07/17/2015   Insomnia 07/17/2015   Rheumatoid arthritis ([place]) 12/05/2014   Other specified postprocedural states 07/20/2013   Osteoarthritis of knee 06/07/2013   Cough 05/02/2012   SOB (shortness of breath) 10/13/2011   Valvular heart disease 10/13/2011   Chronic obstructive pulmonary disease (COPD) ([place]) 09/09/2011   OSA (obstructive sleep apnea) 09/09/2011   Nocturnal hypoxemia 09/09/2011   Gastroesophageal reflux disease 02/24/2011   Single kidney 02/24/2011   H/O cardiac catheterization 02/24/2011   Renal artery stenosis ([place]) 02/24/2011   PCP:  McLean-Scocuzza, Nino Glow, MD Pharmacy:   [id number] [place] [facility] [address] [id number]: [phone]: [phone] [id number] [place] [id number]:  [phone] Fax: [place], [name] [place] [address] Phone: [phone] Fax: [phone]     Social Determinants of Health (SDOH) Interventions    Readmission Risk Interventions    08/21/2021    3:19 PM 05/24/2021    2:35 PM  Readmission Risk Prevention Plan  Transportation Screening Complete Complete  PCP or Specialist Appt within 3-5 Days  Complete  HRI or [place]  Complete  Social Work Consult for [place] Planning/Counseling  Complete  Palliative Care Screening  Not Applicable  Medication Review Press photographer) Complete Complete  HRI or [place] Complete   SW Recovery Care/Counseling Consult Complete   Palliative Care Screening Not [place] Not Applicable

## 2021-08-21 NOTE — Plan of Care (Signed)
Pt AAOx4, no pain. VS are stable. Plan to wean oxygen and mobility as tolerated. Currently up to chair. Call light within reach. Will continue to monitor.

## 2021-08-22 ENCOUNTER — Inpatient Hospital Stay: Payer: Medicare PPO | Admitting: Oncology

## 2021-08-22 DIAGNOSIS — J441 Chronic obstructive pulmonary disease with (acute) exacerbation: Secondary | ICD-10-CM | POA: Diagnosis not present

## 2021-08-22 LAB — CBC
HCT: 27.6 % — ABNORMAL LOW (ref 36.0–46.0)
Hemoglobin: 8 g/dL — ABNORMAL LOW (ref 12.0–15.0)
MCH: 23.7 pg — ABNORMAL LOW (ref 26.0–34.0)
MCHC: 29 g/dL — ABNORMAL LOW (ref 30.0–36.0)
MCV: 81.9 fL (ref 80.0–100.0)
Platelets: 252 10*3/uL (ref 150–400)
RBC: 3.37 MIL/uL — ABNORMAL LOW (ref 3.87–5.11)
RDW: 16.8 % — ABNORMAL HIGH (ref 11.5–15.5)
WBC: 14.2 10*3/uL — ABNORMAL HIGH (ref 4.0–10.5)
nRBC: 0.1 % (ref 0.0–0.2)

## 2021-08-22 LAB — BASIC METABOLIC PANEL
Anion gap: 12 (ref 5–15)
BUN: 29 mg/dL — ABNORMAL HIGH (ref 8–23)
CO2: 24 mmol/L (ref 22–32)
Calcium: 8.2 mg/dL — ABNORMAL LOW (ref 8.9–10.3)
Chloride: 104 mmol/L (ref 98–111)
Creatinine, Ser: 1.36 mg/dL — ABNORMAL HIGH (ref 0.44–1.00)
GFR, Estimated: 40 mL/min — ABNORMAL LOW (ref >60)
Glucose, Bld: 131 mg/dL — ABNORMAL HIGH (ref 70–99)
Potassium: 4 mmol/L (ref 3.5–5.1)
Sodium: 140 mmol/L (ref 135–145)

## 2021-08-22 MED ORDER — BENZONATATE 100 MG PO CAPS
100.0000 mg | ORAL_CAPSULE | Freq: Once | ORAL | Status: AC
  Administered 2021-08-22: 100 mg via ORAL
  Filled 2021-08-22: qty 1

## 2021-08-22 MED ORDER — AZITHROMYCIN 500 MG PO TABS: 500.0000 mg | ORAL_TABLET | Freq: Every day | ORAL | 0 refills | Status: DC

## 2021-08-22 MED ORDER — GUAIFENESIN ER 600 MG PO TB12: 600.0000 mg | ORAL_TABLET | Freq: Two times a day (BID) | ORAL | 0 refills | Status: AC

## 2021-08-22 MED ORDER — PREDNISONE 20 MG PO TABS: 20.0000 mg | ORAL_TABLET | Freq: Every day | ORAL | 0 refills | Status: AC

## 2021-08-22 NOTE — Progress Notes (Signed)
Occupational Therapy Treatment Patient Details Name: Kerri Carter MRN: [id number] DOB: 05-Mar-1940 Today's Date: 08/22/2021   History of present illness presented to ER secondary to progressive SOB, cough; admitted for management of acute/chronic respiratory failure due to acute COPD exacerbation   OT comments  Kerri Carter received in recliner, stating she will be [place] home soon, and requesting assistance with toileting, dressing, and ensuring that she understood how to use home oxygen equipment. She did well in session, able to ambulate to and from bathroom with RW, keeping O2 sats in upper 90s, no LOB, and able to perform toileting, dressing with SUPV, other than MinA for threading pant legs. Provided educ re: O2 equipment, with pt verbalizing understanding and providing teach-back. Pt denies pain this afternoon. DC recs remain appropriate.    Recommendations for follow up therapy are one component of a multi-disciplinary discharge planning process, led by the attending physician.  Recommendations may be updated based on patient status, additional functional criteria and insurance authorization.    Follow Up Recommendations  Home health OT    Assistance Recommended at Discharge Intermittent Supervision/Assistance  Patient can return home with the following  A little help with bathing/dressing/bathroom;Assistance with cooking/housework;A little help with walking and/or transfers   Equipment Recommendations  None recommended by OT    Recommendations for Other Services      Precautions / Restrictions Precautions Precautions: Fall;Back Type of Shoulder Precautions: Per imaging 6/29 failed ORIF of L shoulder, awaiting MD guidance Precaution Comments: hx of lumbar fusion 05/23/21 Required Braces or Orthoses: Spinal Brace Restrictions Other Position/Activity Restrictions: Per Kerri Carter TLSO is not required when mobilizing pt       Mobility Bed Mobility               General  bed mobility comments: received/left in recliner    Transfers Overall transfer level: Needs assistance Equipment used: Rolling walker (2 wheels) Transfers: Sit to/from Stand Sit to Stand: Min guard                 Balance Overall balance assessment: Needs assistance Sitting-balance support: No upper extremity supported, Feet supported Sitting balance-Leahy Scale: Good     Standing balance support: Bilateral upper extremity supported, Single extremity supported, During functional activity, Reliant on assistive device for balance Standing balance-Leahy Scale: Good Standing balance comment: 1 hand support to perform ADLs in standing.                           ADL either performed or assessed with clinical judgement   ADL Overall ADL's : Needs assistance/impaired     Grooming: Wash/dry hands;Standing;Supervision/safety               Lower Body Dressing: Minimal assistance Lower Body Dressing Details (indicate cue type and reason): Min A for threading pant legs (Pt reports she uses a reacher at home) Toilet Transfer: Supervision/safety;Regular Toilet;Grab bars;Ambulation;Rolling walker (2 wheels)   Toileting- Clothing Manipulation and Hygiene: Minimal assistance;Sit to/from stand              Extremity/Trunk Assessment Upper Extremity Assessment Upper Extremity Assessment: Overall WFL for tasks assessed;LUE deficits/detail RUE Deficits / Details: Hx of failed L shoulder ORIF.  Pt states loose hardware.  Pt has passive L shoulder flexion ~0-100, no active; RUE WFL and good bilat bicep/tricep strength for functional transfers and ADLs.   Lower Extremity Assessment Lower Extremity Assessment: Overall WFL for tasks assessed  Vision       Perception     Praxis      Cognition Arousal/Alertness: Awake/alert Behavior During Therapy: WFL for tasks assessed/performed Overall Cognitive Status: Within Functional Limits for tasks assessed                                  General Comments: pleasant, cooperative, A&Ox4        Exercises Other Exercises Other Exercises: Educ re: O2 equipment going home w/ pt, ECS, falls prevention    Shoulder Instructions       General Comments      Pertinent Vitals/ Pain       Pain Assessment Faces Pain Scale: No hurt  Home Living                                          Prior Functioning/Environment              Frequency  Min 2X/week        Progress Toward Goals  OT Goals(current goals can now be found in the care plan section)  Progress towards OT goals: Progressing toward goals  Acute Rehab OT Goals OT Goal Formulation: With patient Time For Goal Achievement: 09/04/21 Potential to Achieve Goals: Good  Plan Discharge plan remains appropriate;Frequency remains appropriate    Co-evaluation                 AM-PAC OT "6 Clicks" Daily Activity     Outcome Measure   Help from another person eating meals?: None Help from another person taking care of personal grooming?: A Little Help from another person toileting, which includes using toliet, bedpan, or urinal?: A Little Help from another person bathing (including washing, rinsing, drying)?: A Little Help from another person to put on and taking off regular upper body clothing?: A Little Help from another person to put on and taking off regular lower body clothing?: A Little 6 Click Score: 19    End of Session Equipment Utilized During Treatment: Rolling walker (2 wheels)  OT Visit Diagnosis: Unsteadiness on feet (R26.81);Muscle weakness (generalized) (M62.81)   Activity Tolerance Patient tolerated treatment well   Patient Left in chair;with call bell/phone within reach;with nursing/sitter in room   Nurse Communication          Time: 4174-0814 OT Time Calculation (min): 22 min  Charges: OT General Charges $OT Visit: 1 Visit OT Treatments $Self Care/Home Management  : 8-22 mins  Josiah Lobo, PhD, MS, OTR/L 08/22/21, 4:45 PM

## 2021-08-22 NOTE — TOC Progression Note (Addendum)
Transition of Care [facility]) - Progression Note    Patient Details  Name: Kerri Carter MRN: [id number] Date of Birth: 01-22-1941  Transition [facility]) CM/SW Contact  Laurena Slimmer, RN Phone Number: 08/22/2021, 2:24 PM  Clinical Narrative:    Spoke with patient's son and patient regarding discharge. Patient's granddaughter will transport her home. Confirmed oxygen had been delivered to patient's room and at home. TOC signing off.    Expected Discharge Plan: [place] Barriers to Discharge: Continued Medical Work up  Expected Discharge Plan and Services Expected Discharge Plan: [place] arrangements for the past 2 months: Single Family Home                                       Social Determinants of Health (SDOH) Interventions    Readmission Risk Interventions    08/21/2021    3:19 PM 05/24/2021    2:35 PM  Readmission Risk Prevention Plan  Transportation Screening Complete Complete  PCP or Specialist Appt within 3-5 Days  Complete  HRI or [place]  Complete  Social Work Consult for [place] Planning/Counseling  Complete  Palliative Care Screening  Not Applicable  Medication Review Press photographer) Complete Complete  HRI or [place] Complete   SW Recovery Care/Counseling Consult Complete   [place] Not Applicable

## 2021-08-22 NOTE — Progress Notes (Signed)
At rest on room air, patient O2 sats remain at or above 96%. Upon ambulation (59f) on room air, patient O2 sats dropped to 76%. Upon ambulation with 2L O2 [place], patient O2 sats remain at or above 88%. At rest, patient able to recover O2 sats to 98% with 2L [place].

## 2021-08-22 NOTE — Progress Notes (Signed)
Mobility Specialist - Progress Note   08/22/21 1400  Mobility  Activity Stood at bedside  Level of Assistance Standby assist, set-up cues, supervision of patient - no hands on  Assistive Device Front wheel walker  Activity Response Tolerated well  $Mobility charge 1 Mobility     Post-mobility: 85 HR, 97% SPO2   Pt sitting in recliner upon arrival, utilizing 2L. Pt voiced need for peri-care. Pt stood to RW for peri-hygiene and linen change. SOB on exertion, despite sats maintaining 90s. Pt left in chair with alarm set, needs in reach.    Kathee Delton Mobility Specialist 08/22/21, 2:12 PM

## 2021-08-22 NOTE — TOC Progression Note (Signed)
Transition of Care [facility]) - Progression Note    Patient Details  Name: SNEHA WILLIG MRN: [id number] Date of Birth: 27-Apr-1940  [facility]) CM/SW Contact  Laurena Slimmer, RN Phone Number: 08/22/2021, 1:35 PM  Clinical Narrative:    Home oxygen request made to [employer].    Expected Discharge Plan: [place] Barriers to Discharge: Continued Medical Work up  Expected Discharge Plan and Services Expected Discharge Plan: [place] arrangements for the past 2 months: Single Family Home                                       Social Determinants of Health (SDOH) Interventions    Readmission Risk Interventions    08/21/2021    3:19 PM 05/24/2021    2:35 PM  Readmission Risk Prevention Plan  Transportation Screening Complete Complete  PCP or Specialist Appt within 3-5 Days  Complete  HRI or [place]  Complete  Social Work Consult for [place] Planning/Counseling  Complete  Palliative Care Screening  Not Applicable  Medication Review Press photographer) Complete Complete  HRI or [place] Complete   SW Recovery Care/Counseling Consult Complete   [place] Not Applicable

## 2021-08-22 NOTE — Progress Notes (Signed)
PT Cancellation Note  Patient Details Name: Kerri Carter MRN: [id number] DOB: 1940/05/30   Cancelled Treatment:    Reason Eval/Treat Not Completed: Other (comment).  Pt chart reviewed and attempted to be seen.  Pt actively being d/c at this time.  Will attempt at later date/time if current POC changes.     Gwenlyn Saran, PT, DPT 08/22/21, 3:58 PM

## 2021-08-22 NOTE — Discharge Summary (Signed)
Kerri Carter YNW:[id number] DOB: December 05, 1940 DOA: 08/20/2021  PCP: McLean-Scocuzza, Nino Glow, MD  Admit date: 08/20/2021 Discharge date: 08/22/2021  Admitted From: home Disposition:home  Recommendations for Outpatient Follow-up:  Follow up with PCP in 1 week Please obtain BMP/CBC in one week Please follow up pccm in one week     Discharge Condition:Stable CODE STATUS:DNR Diet recommendation: Heart Healthy Brief/Interim Summary: Per HPI: Kerri Carter is a 81 y.o. female with medical history significant of hypertension, COPD, chronic respiratory failure requiring 2 L of nasal cannula oxygen at night, bipolar disorder, PAD, PUD, anxiety, depression, and history of lumbar fusion who presents with progressively worsening shortness of breath and cough over the last week.  She has had a persistent nonproductive cough.  Denies having any significant fevers, chills, chest pain, abdominal pain, nausea, vomiting, diarrhea, blood in stool/urine, or dysuria symptoms.  She has had some mild swelling of her right leg that she just recently noticed an reports some right shoulder pain pain that does not radiate.  She had been using 2 L of oxygen throughout the day due to her symptoms for the last couple days.  Patient had taken 3 DuoNeb nebulizer treatments yesterday without any improvement of symptoms.  She had gone to the point where she was unable to walk even a couple feet without becoming significantly out of breath.  Over the phone her son makes note that he had taken her to her cardiologist office 5 days ago and she seemed to be doing okay still little short of breath at that time and no changes were made to her medication regimen. She had recently been hospitalized 6/20-6/22 for left lower lobe community-acquired pneumonia treated with Augmentin and was readmitted into the hospital 6/57-8/46 for diastolic CHF along with COPD exacerbation.   Upon admission into the emergency department patient was noted to  be afebrile with respiration 14-23, O2 saturations noted as low as 89% with improvement on 2 L of nasal cannula oxygen, and all other vital signs maintained.  Chest x-ray noted airway thickening suggesting bronchitis or reactive airway disease and healing fractures of the right coracoid base along with right and left rib fractures.  Patient had been given  Solu-Medrol 90 mg IV and multiple breathing treatments.  TRH called to admit.  Acute on chronic respiratory failure with hypoxia secondary to COPD exacerbation Was treated with abx, iv steroid, nebs Has improved. Will need to be discharged with home 02 Will need to f/u with her primary PCCM Discharge with steroid taper   Diastolic congestive heart failure Euvolemic and stable   Paroxysmal atrial fibrillation on chronic anticoagulation Patient currently appears to be in sinus rhythm.   Continue beta blk and eliquis   Essential hypertension Continue home meds   Hypochromic anemia Chronic.   H/h stable F/u with pcp for further monitoring   S/p Lumbar fusion Patient had undergone transforaminal lumbar interbody fusion L3/4 and 2/3 and posterolateral arthrodesis L2-L5 on 05/23/2021.    Mixed bipolar disorder Continue home meds   Hyperlipidemia Continue tatin   GERD Continue home meds   Discharge Diagnoses:  Principal Problem:   COPD with exacerbation ([place]) Active Problems:   Acute on chronic respiratory failure with hypoxia (HCC)   Chronic diastolic CHF (congestive heart failure) (HCC)   Paroxysmal atrial fibrillation (HCC)   Essential hypertension   Hypochromic anemia   S/P lumbar fusion   Mixed bipolar I disorder (HCC)   Hyperlipidemia   Gastroesophageal reflux disease    Discharge  Instructions  Discharge Instructions     Diet - low sodium heart healthy   Complete by: As directed    Discharge instructions   Complete by: As directed    Follow up with Dr. Duwayne Heck next week F/u with pcp in one week    Increase activity slowly   Complete by: As directed       Allergies as of 08/22/2021       Reactions   Ceftin [cefuroxime Axetil] Anaphylaxis   Lisinopril Anaphylaxis   Sulfa Antibiotics Other (See Comments)   Face swelling   Sulfasalazine Anaphylaxis   Morphine Other (See Comments)   Per patient, low blood pressure issues that requires action to raise it back up. Can take small infrequent doses   Xarelto [rivaroxaban] Other (See Comments)   Stomach burning, bleeding, and tar in stool   Adhesive [tape] Rash   Paper tape and tega derm OK   Antihistamines, Chlorpheniramine-type Other (See Comments)   Makes pt hyper   Antivert [meclizine Hcl] Other (See Comments)   Bladder will not empty   Aspirin Other (See Comments)   Sulfasalazine allergy cross reacts   Contrast Media [iodinated Contrast Media] Rash   she is able to use betadine scrubs.   Decongestant [pseudoephedrine Hcl] Other (See Comments)   Makes pt hyper   Doxycycline Other (See Comments)   GI upset   Levaquin [levofloxacin In D5w] Rash   Polymyxin B Other (See Comments)   Facial rash   Tetanus Toxoids Rash, Other (See Comments)   Fever and hot to touch at injection site        Medication List     STOP taking these medications    Adalimumab 40 MG/0.4ML Pnkt   cyclobenzaprine 10 MG tablet Commonly known as: FLEXERIL   etodolac 500 MG tablet Commonly known as: LODINE       TAKE these medications    albuterol 108 (90 Base) MCG/ACT inhaler Commonly known as: Ventolin HFA Inhale 2 puffs into the lungs every 6 (six) hours as needed for wheezing or shortness of breath.   amLODipine 5 MG tablet Commonly known as: NORVASC Take 1 tablet (5 mg total) by mouth daily.   apixaban 5 MG Tabs tablet Commonly known as: ELIQUIS Take 1 tablet (5 mg total) by mouth 2 (two) times daily.   azithromycin 500 MG tablet Commonly known as: ZITHROMAX Take 1 tablet (500 mg total) by mouth daily.   budesonide 0.25  MG/2ML nebulizer solution Commonly known as: PULMICORT USE 1 VIAL  IN  NEBULIZER TWICE  DAILY - rinse mouth after treatment   busPIRone 5 MG tablet Commonly known as: BUSPAR Take 1 tablet (5 mg total) by mouth 2 (two) times daily.   dicyclomine 10 MG capsule Commonly known as: BENTYL Take 1 capsule (10 mg total) by mouth 2 (two) times daily as needed for spasms. What changed:  how much to take when to take this   escitalopram 10 MG tablet Commonly known as: LEXAPRO Take 1 tablet (10 mg total) by mouth daily.   famotidine 20 MG tablet Commonly known as: PEPCID TAKE 1 TABLET (20 MG TOTAL) BY MOUTH DAILY. BEFORE BREAKFAST OR DINNER   fluticasone 50 MCG/ACT nasal spray Commonly known as: Flonase Place 2 sprays into both nostrils daily. In am   gabapentin 300 MG capsule Commonly known as: NEURONTIN Take 2 capsules (600 mg total) by mouth in the morning and at bedtime.   guaiFENesin 600 MG 12 hr tablet Commonly known as:  MUCINEX Take 1 tablet (600 mg total) by mouth 2 (two) times daily for 7 days.   ipratropium-albuterol 0.5-2.5 (3) MG/3ML Soln Commonly known as: DUONEB Take 3 mLs by nebulization 3 (three) times daily as needed.   lamoTRIgine 100 MG tablet Commonly known as: LAMICTAL TAKE 1 TAB 2 TIMES DAILY. FURTHER REFILLS NEW PSYCH FOR ALL PSYCH MEDS ONLY TEMP SUPPLY FROM PCP   leflunomide 20 MG tablet Commonly known as: ARAVA Take 1 tablet (20 mg total) by mouth daily.   lidocaine 5 % Commonly known as: Lidoderm Place 1 patch onto the skin 2 (two) times daily as needed. Remove & Discard patch within 12 hours or as directed by MD What changed: when to take this   lovastatin 20 MG tablet Commonly known as: MEVACOR TAKE 1 TABLET BY MOUTH EVERYDAY AT BEDTIME *STOP TALKING '40MG'$ *   methocarbamol 500 MG tablet Commonly known as: ROBAXIN Take 500 mg by mouth 4 (four) times daily as needed.   mirabegron ER 50 MG Tb24 tablet Commonly known as: Myrbetriq Take 1 tablet  (50 mg total) by mouth daily.   montelukast 10 MG tablet Commonly known as: SINGULAIR TAKE 1 TABLET BY MOUTH EVERY DAY   multivitamin-lutein Caps capsule Take 1 capsule by mouth at bedtime.   nebivolol 5 MG tablet Commonly known as: Bystolic Take 1 tablet (5 mg total) by mouth in the morning and at bedtime. (Note dose changed from 1/2 10 mg bid to 5 mg bid)   oxyCODONE-acetaminophen 5-325 MG tablet Commonly known as: Percocet Take 1 tablet by mouth every 4 (four) hours as needed for severe pain.   OXYGEN Inhale 2 L into the lungs at bedtime.   pantoprazole 40 MG tablet Commonly known as: PROTONIX TAKE 1 TABLET BY MOUTH 2 TIMES DAILY 30 MIN BEFORE FOOD (NOTE REDUCTION IN FREQUENCY)   predniSONE 20 MG tablet Commonly known as: DELTASONE Take 1 tablet (20 mg total) by mouth daily with breakfast for 5 days. Start taking on: August 23, 2021   QUEtiapine 25 MG tablet Commonly known as: SEROQUEL TAKE 1 TABLET (25 MG TOTAL) BY MOUTH AT BEDTIME. AGAIN LAST FILL FURTHER REFILLS FROM PSYCHIATRY NO EXCEPTIONS   sucralfate 1 g tablet Commonly known as: CARAFATE Take 1 tablet (1 g total) by mouth 2 (two) times daily.   torsemide 20 MG tablet Commonly known as: DEMADEX Take 1 tablet (20 mg total) by mouth daily.   Vitamin D3 50 MCG (2000 UT) Tabs Take 4,000 Units by mouth daily.   zoledronic acid 5 MG/100ML Soln injection Commonly known as: RECLAST Inject 5 mg into the vein once.               Durable Medical Equipment  (From admission, onward)           Start     Ordered   08/22/21 1319  For home use only DME oxygen  Once       Question Answer Comment  Length of Need Lifetime   Mode or (Route) Nasal cannula   Liters per Minute 3   Frequency Continuous (stationary and portable oxygen unit needed)   Oxygen conserving device Yes   Oxygen delivery system Gas      08/22/21 1319            Follow-up Information     [name], MD Follow up in 1  week(s).   Specialty: Pulmonary Disease Contact information: [place] [address] [phone]  McLean-Scocuzza, Nino Glow, MD Follow up in 1 week(s).   Specialty: Internal Medicine Contact information: [place] [address] [phone]                Allergies  Allergen Reactions   Ceftin [Cefuroxime Axetil] Anaphylaxis   Lisinopril Anaphylaxis   Sulfa Antibiotics Other (See Comments)    Face swelling   Sulfasalazine Anaphylaxis   Morphine Other (See Comments)    Per patient, low blood pressure issues that requires action to raise it back up. Can take small infrequent doses   Xarelto [Rivaroxaban] Other (See Comments)    Stomach burning, bleeding, and tar in stool   Adhesive [Tape] Rash    Paper tape and tega derm OK   Antihistamines, Chlorpheniramine-Type Other (See Comments)    Makes pt hyper   Antivert [Meclizine Hcl] Other (See Comments)    Bladder will not empty   Aspirin Other (See Comments)    Sulfasalazine allergy cross reacts   Contrast Media [Iodinated Contrast Media] Rash    she is able to use betadine scrubs.   Decongestant [Pseudoephedrine Hcl] Other (See Comments)    Makes pt hyper   Doxycycline Other (See Comments)    GI upset   Levaquin [Levofloxacin In D5w] Rash   Polymyxin B Other (See Comments)    Facial rash   Tetanus Toxoids Rash and Other (See Comments)    Fever and hot to touch at injection site    Consultations:    Procedures/Studies: DG Chest Portable 1 View  Result Date: 08/20/2021 CLINICAL DATA:  Cough.  Shortness of breath for 1 week. EXAM: PORTABLE CHEST 1 VIEW COMPARISON:  07/28/2021 FINDINGS: Airway thickening is present, suggesting bronchitis or reactive airways disease. Healing fractures the right posterolateral fifth, sixth, and seventh ribs with callus formation noted. There also anterior healing fractures of the right fourth and fifth ribs. Late phase healing  fracture the left posterolateral third rib. Review of current in recent prior exams reveals a healing fracture at the base of the right coracoid. Heart size within normal limits for AP projection. No blunting of the costophrenic angles. IMPRESSION: 1. Airway thickening is present, suggesting bronchitis or reactive airways disease. 2. Healing fracture the right coracoid base. Late phase healing fractures of right and left ribs. Electronically Signed   By: Van Clines M.D.   On: 08/20/2021 09:01   DG Shoulder Left  Result Date: 07/31/2021 CLINICAL DATA:  Left shoulder hump. Multiple prior left shoulder surgeries. EXAM: LEFT SHOULDER - 2+ VIEW; LEFT HUMERUS - 2+ VIEW COMPARISON:  Left shoulder x-rays dated December 21, 2019. FINDINGS: Prior reverse total shoulder arthroplasty. No evidence of hardware failure or loosening. Prior ORIF of the acromion with displacement of the distal acromion fragment from the hardware, new since postoperative x-rays from November 2021. Hardware approximates the skin surface. Heterotopic ossification posterior to the arthroplasty. The mid to distal humerus and elbow are unremarkable. IMPRESSION: 1. Failed ORIF of the acromion with displacement of the distal acromion fragment from the hardware, new since postoperative x-rays from December 21, 2019. 2. Prior reverse total shoulder arthroplasty without hardware complication. Electronically Signed   By: Titus Dubin M.D.   On: 07/31/2021 11:09   DG Humerus Left  Result Date: 07/31/2021 CLINICAL DATA:  Left shoulder hump. Multiple prior left shoulder surgeries. EXAM: LEFT SHOULDER - 2+ VIEW; LEFT HUMERUS - 2+ VIEW COMPARISON:  Left shoulder x-rays dated December 21, 2019. FINDINGS: Prior reverse total shoulder arthroplasty. No evidence of hardware  failure or loosening. Prior ORIF of the acromion with displacement of the distal acromion fragment from the hardware, new since postoperative x-rays from November 2021. Hardware  approximates the skin surface. Heterotopic ossification posterior to the arthroplasty. The mid to distal humerus and elbow are unremarkable. IMPRESSION: 1. Failed ORIF of the acromion with displacement of the distal acromion fragment from the hardware, new since postoperative x-rays from December 21, 2019. 2. Prior reverse total shoulder arthroplasty without hardware complication. Electronically Signed   By: Titus Dubin M.D.   On: 07/31/2021 11:09   ECHOCARDIOGRAM COMPLETE  Result Date: 07/30/2021    ECHOCARDIOGRAM REPORT   Patient Name:   ALIZA MORET Date of Exam: 07/29/2021 Medical Rec #:  [id number]     Height:       61.5 in Accession #:    [id number]    Weight:       173.1 lb Date of Birth:  1940/04/16     BSA:          1.787 m Patient Age:    70 years      BP:           130/52 mmHg Patient Gender: F             HR:           86 bpm. Exam Location:  [facility] Procedure: 2D Echo, Color Doppler and Cardiac Doppler Indications:     Mitral valve Insufficiency I34.0  History:         Patient has prior history of Echocardiogram examinations, most                  recent 05/26/2021. COPD; Risk Factors:Hypertension. Aortic                  stenosis.  Sonographer:     Sherrie Sport Referring Phys:  [id number] [place] TANG Diagnosing Phys: Isaias Cowman MD  Sonographer Comments: No parasternal window, no subcostal window and Technically challenging study due to limited acoustic windows. Image acquisition challenging due to COPD. IMPRESSIONS  1. Left ventricular ejection fraction, by estimation, is 55 to 60%. The left ventricle has normal function. The left ventricle has no regional wall motion abnormalities. Left ventricular diastolic parameters are consistent with Grade I diastolic dysfunction (impaired relaxation).  2. Right ventricular systolic function is normal. The right ventricular size is normal.  3. The mitral valve is normal in structure. Moderate to severe mitral valve regurgitation. No evidence of  mitral stenosis.  4. The aortic valve is normal in structure. Aortic valve regurgitation is moderate. Mild to moderate aortic valve stenosis.  5. The inferior vena cava is normal in size with greater than 50% respiratory variability, suggesting right atrial pressure of 3 mmHg. FINDINGS  Left Ventricle: Left ventricular ejection fraction, by estimation, is 55 to 60%. The left ventricle has normal function. The left ventricle has no regional wall motion abnormalities. The left ventricular internal cavity size was normal in size. There is  no left ventricular hypertrophy. Left ventricular diastolic parameters are consistent with Grade I diastolic dysfunction (impaired relaxation). Right Ventricle: The right ventricular size is normal. No increase in right ventricular wall thickness. Right ventricular systolic function is normal. Left Atrium: Left atrial size was normal in size. Right Atrium: Right atrial size was normal in size. Pericardium: There is no evidence of pericardial effusion. Mitral Valve: The mitral valve is normal in structure. There is moderate thickening of the mitral valve leaflet(s). Moderate to severe  mitral valve regurgitation. No evidence of mitral valve stenosis. MV peak gradient, 7.6 mmHg. The mean mitral valve gradient is 4.0 mmHg. Tricuspid Valve: The tricuspid valve is normal in structure. Tricuspid valve regurgitation is mild . No evidence of tricuspid stenosis. Aortic Valve: The aortic valve is normal in structure. Aortic valve regurgitation is moderate. Mild to moderate aortic stenosis is present. Aortic valve mean gradient measures 17.0 mmHg. Aortic valve peak gradient measures 29.4 mmHg. Aortic valve area, by VTI measures 1.37 cm. Pulmonic Valve: The pulmonic valve was normal in structure. Pulmonic valve regurgitation is not visualized. No evidence of pulmonic stenosis. Aorta: The aortic root is normal in size and structure. Venous: The inferior vena cava is normal in size with greater  than 50% respiratory variability, suggesting right atrial pressure of 3 mmHg. IAS/Shunts: No atrial level shunt detected by color flow Doppler.  LEFT VENTRICLE PLAX 2D LVIDd:         4.30 cm   Diastology LVIDs:         3.10 cm   LV e' medial:    3.92 cm/s LV PW:         1.30 cm   LV E/e' medial:  26.3 LV IVS:        1.00 cm   LV e' lateral:   3.15 cm/s LVOT diam:     2.00 cm   LV E/e' lateral: 32.7 LV SV:         70 LV SV Index:   39 LVOT Area:     3.14 cm  RIGHT VENTRICLE RV Basal diam:  2.90 cm RV S prime:     10.70 cm/s TAPSE (M-mode): 2.2 cm LEFT ATRIUM              Index        RIGHT ATRIUM           Index LA diam:        4.50 cm  2.52 cm/m   RA Area:     13.30 cm LA Vol (A2C):   56.5 ml  31.62 ml/m  RA Volume:   30.50 ml  17.07 ml/m LA Vol (A4C):   119.0 ml 66.59 ml/m LA Biplane Vol: 86.4 ml  48.35 ml/m  AORTIC VALVE AV Area (Vmax):    1.23 cm AV Area (Vmean):   1.28 cm AV Area (VTI):     1.37 cm AV Vmax:           271.33 cm/s AV Vmean:          189.000 cm/s AV VTI:            0.515 m AV Peak Grad:      29.4 mmHg AV Mean Grad:      17.0 mmHg LVOT Vmax:         106.00 cm/s LVOT Vmean:        77.200 cm/s LVOT VTI:          0.224 m LVOT/AV VTI ratio: 0.43  AORTA Ao Root diam: 1.80 cm MITRAL VALVE                TRICUSPID VALVE MV Area (PHT): 2.93 cm     TR Peak grad:   20.4 mmHg MV Area VTI:   1.55 cm     TR Vmax:        226.00 cm/s MV Peak grad:  7.6 mmHg MV Mean grad:  4.0 mmHg     SHUNTS MV Vmax:  1.38 m/s     Systemic VTI:  0.22 m MV Vmean:      90.4 cm/s    Systemic Diam: 2.00 cm MV Decel Time: 259 msec MV E velocity: 103.00 cm/s MV A velocity: 120.00 cm/s MV E/A ratio:  0.86 Isaias Cowman MD Electronically signed by Isaias Cowman MD Signature Date/Time: 07/30/2021/12:19:57 PM    Final    DG Chest Portable 1 View  Result Date: 07/28/2021 CLINICAL DATA:  Shortness of breath.  History of COPD. EXAM: PORTABLE CHEST 1 VIEW COMPARISON:  Radiographs 07/22/2021 and 05/29/2021.  CT  07/22/2021. FINDINGS: 1322 hours. Lordotic positioning. The heart size and mediastinal contours are stable. Interval increased ill-defined density in the right upper lobe which could reflect an early infiltrate. The left lung appears clear. There is no evidence of pleural effusion or pneumothorax. Patient is status post bilateral shoulder reverse arthroplasty, and there are several old right-sided rib fractures. No acute osseous findings are evident. Telemetry leads overlie the chest. IMPRESSION: Possible new ill-defined infiltrate or atelectasis in the right upper lobe. Electronically Signed   By: Richardean Sale M.D.   On: 07/28/2021 13:36      Subjective: Feels better. No sob or cp  Discharge Exam: Vitals:   08/22/21 1031 08/22/21 1034  BP:    Pulse: 91 68  Resp:    Temp:    SpO2: 95% (!) 79%   Vitals:   08/22/21 0748 08/22/21 0822 08/22/21 1031 08/22/21 1034  BP:  (!) 126/57    Pulse:  83 91 68  Resp:  18    Temp:  98.1 F (36.7 C)    TempSrc:      SpO2: 92% 92% 95% (!) 79%  Weight:      Height:        General: Pt is alert, awake, not in acute distress Cardiovascular: RRR, S1/S2 +, no rubs, no gallops Respiratory: CTA bilaterally, no wheezing, no rhonchi Abdominal: Soft, NT, ND, bowel sounds + Extremities: no edema, no cyanosis    The results of significant diagnostics from this hospitalization (including imaging, microbiology, ancillary and laboratory) are listed below for reference.     Microbiology: Recent Results (from the past 240 hour(s))  Resp Panel by RT-PCR (Flu A&B, Covid) Anterior Nasal Swab     Status: None   Collection Time: 08/20/21  7:48 AM   Specimen: Anterior Nasal Swab  Result Value Ref Range Status   SARS Coronavirus 2 by RT PCR NEGATIVE NEGATIVE Final    Comment: (NOTE) SARS-CoV-2 target nucleic acids are NOT DETECTED.  The SARS-CoV-2 RNA is generally detectable in upper respiratory specimens during the acute phase of infection. The  lowest concentration of SARS-CoV-2 viral copies this assay can detect is 138 copies/mL. A negative result does not preclude SARS-Cov-2 infection and should not be used as the sole basis for treatment or other patient management decisions. A negative result may occur with  improper specimen collection/handling, submission of specimen other than nasopharyngeal swab, presence of viral mutation(s) within the areas targeted by this assay, and inadequate number of viral copies(<138 copies/mL). A negative result must be combined with clinical observations, patient history, and epidemiological information. The expected result is Negative.  Fact Sheet for Patients:  EntrepreneurPulse.com.au  Fact Sheet for Healthcare Providers:  IncredibleEmployment.be  This test is no t yet approved or cleared by the [place] FDA and  has been authorized for detection and/or diagnosis of SARS-CoV-2 by FDA under an Emergency Use Authorization (EUA). This EUA will  remain  in effect (meaning this test can be used) for the duration of the COVID-19 declaration under Section 564(b)(1) of the Act, 21 U.S.C.section 360bbb-3(b)(1), unless the authorization is terminated  or revoked sooner.       Influenza A by PCR NEGATIVE NEGATIVE Final   Influenza B by PCR NEGATIVE NEGATIVE Final    Comment: (NOTE) The Xpert Xpress SARS-CoV-2/FLU/RSV plus assay is intended as an aid in the diagnosis of influenza from Nasopharyngeal swab specimens and should not be used as a sole basis for treatment. Nasal washings and aspirates are unacceptable for Xpert Xpress SARS-CoV-2/FLU/RSV testing.  Fact Sheet for Patients: EntrepreneurPulse.com.au  Fact Sheet for Healthcare Providers: IncredibleEmployment.be  This test is not yet approved or cleared by the [place] FDA and has been authorized for detection and/or diagnosis of SARS-CoV-2 by FDA under  an Emergency Use Authorization (EUA). This EUA will remain in effect (meaning this test can be used) for the duration of the COVID-19 declaration under Section 564(b)(1) of the Act, 21 U.S.C. section 360bbb-3(b)(1), unless the authorization is terminated or revoked.  Performed at [facility], [address] [place], [place] [address]      Labs: BNP (last 3 results) Recent Labs    07/28/21 1319 07/29/21 0551 08/20/21 0748  BNP 670.5* 503.4* 254.2*   Basic Metabolic Panel: Recent Labs  Lab 08/20/21 0748 08/21/21 0500 08/22/21 0446  NA 141 139 140  K 3.5 3.6 4.0  CL 107 104 104  CO2 '26 27 24  '$ GLUCOSE 135* 136* 131*  BUN 26* 26* 29*  CREATININE 1.52* 1.28* 1.36*  CALCIUM 8.3* 8.2* 8.2*   Liver Function Tests: Recent Labs  Lab 08/20/21 0748  AST 25  ALT 15  ALKPHOS 104  BILITOT 0.4  PROT 6.7  ALBUMIN 3.3*   No results for input(s): "LIPASE", "AMYLASE" in the last 168 hours. No results for input(s): "AMMONIA" in the last 168 hours. CBC: Recent Labs  Lab 08/20/21 0748 08/21/21 0500 08/22/21 0446  WBC 13.1* 13.6* 14.2*  NEUTROABS 5.6  --   --   HGB 8.5* 7.5* 8.0*  HCT 29.5* 25.3* 27.6*  MCV 80.4 77.8* 81.9  PLT 327 286 252   Cardiac Enzymes: No results for input(s): "CKTOTAL", "CKMB", "CKMBINDEX", "TROPONINI" in the last 168 hours. BNP: Invalid input(s): "POCBNP" CBG: No results for input(s): "GLUCAP" in the last 168 hours. D-Dimer No results for input(s): "DDIMER" in the last 72 hours. Hgb A1c No results for input(s): "HGBA1C" in the last 72 hours. Lipid Profile No results for input(s): "CHOL", "HDL", "LDLCALC", "TRIG", "CHOLHDL", "LDLDIRECT" in the last 72 hours. Thyroid function studies No results for input(s): "TSH", "T4TOTAL", "T3FREE", "THYROIDAB" in the last 72 hours.  Invalid input(s): "FREET3" Anemia work up No results for input(s): "VITAMINB12", "FOLATE", "FERRITIN", "TIBC", "IRON", "RETICCTPCT" in the last 72  hours. Urinalysis    Component Value Date/Time   COLORURINE YELLOW (A) 05/21/2021 1700   APPEARANCEUR CLOUDY (A) 05/21/2021 1700   APPEARANCEUR Clear 10/12/2017 1410   LABSPEC 1.018 05/21/2021 1700   LABSPEC 1.003 11/24/2013 2117   PHURINE 6.0 05/21/2021 1700   GLUCOSEU NEGATIVE 05/21/2021 1700   GLUCOSEU Negative 11/24/2013 2117   HGBUR NEGATIVE 05/21/2021 1700   BILIRUBINUR NEGATIVE 05/21/2021 1700   BILIRUBINUR Negative 10/12/2017 1410   BILIRUBINUR Negative 11/24/2013 2117   KETONESUR NEGATIVE 05/21/2021 1700   PROTEINUR NEGATIVE 05/21/2021 1700   NITRITE NEGATIVE 05/21/2021 1700   LEUKOCYTESUR NEGATIVE 05/21/2021 1700   LEUKOCYTESUR Negative 11/24/2013 2117  Sepsis Labs Recent Labs  Lab 08/20/21 0748 08/21/21 0500 08/22/21 0446  WBC 13.1* 13.6* 14.2*   Microbiology Recent Results (from the past 240 hour(s))  Resp Panel by RT-PCR (Flu A&B, Covid) Anterior Nasal Swab     Status: None   Collection Time: 08/20/21  7:48 AM   Specimen: Anterior Nasal Swab  Result Value Ref Range Status   SARS Coronavirus 2 by RT PCR NEGATIVE NEGATIVE Final    Comment: (NOTE) SARS-CoV-2 target nucleic acids are NOT DETECTED.  The SARS-CoV-2 RNA is generally detectable in upper respiratory specimens during the acute phase of infection. The lowest concentration of SARS-CoV-2 viral copies this assay can detect is 138 copies/mL. A negative result does not preclude SARS-Cov-2 infection and should not be used as the sole basis for treatment or other patient management decisions. A negative result may occur with  improper specimen collection/handling, submission of specimen other than nasopharyngeal swab, presence of viral mutation(s) within the areas targeted by this assay, and inadequate number of viral copies(<138 copies/mL). A negative result must be combined with clinical observations, patient history, and epidemiological information. The expected result is Negative.  Fact Sheet for  Patients:  EntrepreneurPulse.com.au  Fact Sheet for Healthcare Providers:  IncredibleEmployment.be  This test is no t yet approved or cleared by the [place] FDA and  has been authorized for detection and/or diagnosis of SARS-CoV-2 by FDA under an Emergency Use Authorization (EUA). This EUA will remain  in effect (meaning this test can be used) for the duration of the COVID-19 declaration under Section 564(b)(1) of the Act, 21 U.S.C.section 360bbb-3(b)(1), unless the authorization is terminated  or revoked sooner.       Influenza A by PCR NEGATIVE NEGATIVE Final   Influenza B by PCR NEGATIVE NEGATIVE Final    Comment: (NOTE) The Xpert Xpress SARS-CoV-2/FLU/RSV plus assay is intended as an aid in the diagnosis of influenza from Nasopharyngeal swab specimens and should not be used as a sole basis for treatment. Nasal washings and aspirates are unacceptable for Xpert Xpress SARS-CoV-2/FLU/RSV testing.  Fact Sheet for Patients: EntrepreneurPulse.com.au  Fact Sheet for Healthcare Providers: IncredibleEmployment.be  This test is not yet approved or cleared by the [place] FDA and has been authorized for detection and/or diagnosis of SARS-CoV-2 by FDA under an Emergency Use Authorization (EUA). This EUA will remain in effect (meaning this test can be used) for the duration of the COVID-19 declaration under Section 564(b)(1) of the Act, 21 U.S.C. section 360bbb-3(b)(1), unless the authorization is terminated or revoked.  Performed at [facility], [address], [place], [place] [address]      Time coordinating discharge: Over 30 minutes  SIGNED:   Nolberto Hanlon, MD  [facility] 08/22/2021, 3:36 PM Pager   If 7PM-7AM, please contact night-coverage [email or web address] Password [email or web address]

## 2021-08-25 ENCOUNTER — Telehealth: Payer: Self-pay

## 2021-08-25 NOTE — Telephone Encounter (Signed)
Noted, she has upcoming appointment with me.  Looks like she has been discharged.

## 2021-08-25 NOTE — Telephone Encounter (Signed)
Transition Care Management Unsuccessful Follow-up Telephone Call  Date of discharge and from where:  08/22/21 [facility]  Attempts:  1st Attempt  Reason for unsuccessful TCM follow-up call:  Unable to reach patient. Will follow.

## 2021-08-25 NOTE — Telephone Encounter (Signed)
-----   Message from Peggyann Shoals sent at 08/25/2021  1:19 PM EDT ----- Regarding: HH verbal order Contact: [phone] L2-5 PSF, L2-4 TLIF on 4/21 [place]  Verbal order for OT 2 x 3 effective week of 08/24/2021

## 2021-08-25 NOTE — Telephone Encounter (Signed)
Need to find out what she needs OT for. She also needs to reschedule her post op appt with Dr Darreld Mclean. He hasn't seen her since surgery, as she canceled her last appt b/c she was in the hospital.   LM on Shereen's voicemail to return call to discuss.

## 2021-08-26 NOTE — Telephone Encounter (Signed)
Kerri Carter left a voice message that she was returning your call. [phone]

## 2021-08-26 NOTE — Telephone Encounter (Signed)
Left message to return call 

## 2021-08-26 NOTE — Telephone Encounter (Signed)
Transition Care Management Follow-up Telephone Call Date of discharge and from where: 08/22/21 [facility] How have you been since you were released from the hospital? I am doing better. Continuous oxygen 3L. Life alert in place. Paces self with activity. Denies chest pain, falls since discharge, having any significant fevers, chills, abdominal pain, nausea, vomiting, diarrhea, blood in stool/urine, dysuria, and any other harmful symptoms.   Any questions or concerns? No  Items Reviewed: Did the pt receive and understand the discharge instructions provided? Yes  Medications obtained and verified? Yes  Any new allergies since your discharge? No  Dietary orders reviewed? Yes, heart healthy Do you have support at home? Yes   Home Care and Equipment/Supplies: Were home health services ordered? Yes, Evaluation scheduled.   Functional Questionnaire: (I = Independent and D = Dependent) ADLs: Family and aide assist as needed.  Meal Prep- Family assist  Eating- I  Maintaining continence- I  Transferring/Ambulation- Walker  Managing Meds- Family (grandson/granddaughter)  Follow up appointments reviewed:  PCP Hospital f/u appt confirmed? Yes  Scheduled to see PCP on 12/30/21 @ 2:20. [facility] f/u appt confirmed? Yes  Scheduled to see Pulmonary and Surgeon.  Are transportation arrangements needed? No  If their condition worsens, is the pt aware to call PCP or go to the Emergency Dept.? Yes Was the patient provided with contact information for the PCP's office or ED? Yes Was to pt encouraged to call back with questions or concerns? Yes

## 2021-08-26 NOTE — Telephone Encounter (Signed)
I spoke with [employer]. They were calling for resumption of care for OT due to having to postpone b/c she was hospitalized.

## 2021-08-28 NOTE — Chronic Care Management (AMB) (Signed)
  Care Coordination  Note  08/28/2021 Name: Kerri Carter MRN: [id number] DOB: 08/05/1940  Kerri Carter is a 81 y.o. year old female who is a primary care patient of McLean-Scocuzza, Nino Glow, MD. I reached out to Synetta Shadow by phone today to offer care coordination services.      Ms. Altamirano was given information about Care Coordination services today including:  The Care Coordination services include support from the care team which includes your Nurse Coordinator, Clinical Social Worker, or Pharmacist.  The Care Coordination team is here to help remove barriers to the health concerns and goals most important to you. Care Coordination services are voluntary and the patient may decline or stop services at any time by request to their care team member.   Patient agreed to services and verbal consent obtained.   Follow up plan: Telephone appointment with care coordination team member scheduled for:09/08/2021  Noreene Larsson, [place], [place] [address] Direct Dial: [phone] [email or web address]

## 2021-08-29 ENCOUNTER — Telehealth (INDEPENDENT_AMBULATORY_CARE_PROVIDER_SITE_OTHER): Payer: Medicare PPO | Admitting: Internal Medicine

## 2021-08-29 ENCOUNTER — Encounter: Payer: Self-pay | Admitting: Internal Medicine

## 2021-08-29 VITALS — BP 111/53 | Ht 61.5 in | Wt 167.0 lb

## 2021-08-29 DIAGNOSIS — D72829 Elevated white blood cell count, unspecified: Secondary | ICD-10-CM | POA: Diagnosis not present

## 2021-08-29 DIAGNOSIS — J441 Chronic obstructive pulmonary disease with (acute) exacerbation: Secondary | ICD-10-CM

## 2021-08-29 DIAGNOSIS — J8489 Other specified interstitial pulmonary diseases: Secondary | ICD-10-CM

## 2021-08-29 DIAGNOSIS — R296 Repeated falls: Secondary | ICD-10-CM | POA: Diagnosis not present

## 2021-08-29 DIAGNOSIS — N1832 Chronic kidney disease, stage 3b: Secondary | ICD-10-CM

## 2021-08-29 DIAGNOSIS — I5032 Chronic diastolic (congestive) heart failure: Secondary | ICD-10-CM

## 2021-08-29 DIAGNOSIS — J9621 Acute and chronic respiratory failure with hypoxia: Secondary | ICD-10-CM

## 2021-08-29 NOTE — Progress Notes (Unsigned)
Virtual Visit via Video Note  I connected withNAME@  on 08/29/21 at  2:20 PM EDT by a video enabled telemedicine application and verified that I am speaking with the correct person using two identifiers.  Location patient: [place] Location provider:work or home office Persons participating in the virtual visit: patient, provider  I discussed the limitations and requested verbal permission for telemedicine visit. The patient expressed understanding and agreed to proceed.   HPI:  Acute telemedicine visit for : -Onset: -Symptoms include: -Denies: -Has tried: -Pertinent past medical history: see below -Pertinent medication allergies: Allergies  Allergen Reactions   Ceftin [Cefuroxime Axetil] Anaphylaxis   Lisinopril Anaphylaxis   Sulfa Antibiotics Other (See Comments)    Face swelling   Sulfasalazine Anaphylaxis   Morphine Other (See Comments)    Per patient, low blood pressure issues that requires action to raise it back up. Can take small infrequent doses   Xarelto [Rivaroxaban] Other (See Comments)    Stomach burning, bleeding, and tar in stool   Adhesive [Tape] Rash    Paper tape and tega derm OK   Antihistamines, Chlorpheniramine-Type Other (See Comments)    Makes pt hyper   Antivert [Meclizine Hcl] Other (See Comments)    Bladder will not empty   Aspirin Other (See Comments)    Sulfasalazine allergy cross reacts   Contrast Media [Iodinated Contrast Media] Rash    she is able to use betadine scrubs.   Decongestant [Pseudoephedrine Hcl] Other (See Comments)    Makes pt hyper   Doxycycline Other (See Comments)    GI upset   Levaquin [Levofloxacin In D5w] Rash   Polymyxin B Other (See Comments)    Facial rash   Tetanus Toxoids Rash and Other (See Comments)    Fever and hot to touch at injection site   -COVID-19 vaccine status:  Immunization History  Administered Date(s) Administered   Fluad Quad(high Dose 65+) 10/03/2018, 10/16/2019, 11/22/2020   Influenza Split  10/04/2012, 10/25/2013, 10/28/2016   Influenza Whole 11/03/2011   Influenza, High Dose Seasonal PF 10/04/2015, 11/03/2016   Influenza,inj,Quad PF,6+ Mos 10/31/2014   Influenza-Unspecified 01/02/2010, 11/24/2011, 10/21/2013, 10/31/2014, 09/28/2017   Pneumococcal Conjugate-13 10/21/2013   Pneumococcal Polysaccharide-23 11/07/2015     ROS: See pertinent positives and negatives per HPI.  Past Medical History:  Diagnosis Date   Anxiety    Aortic stenosis 07/09/2015   a.) TTE 07/06/2015: EF 55-60%; mild AS with MPG 13.0 mmHg.   Asthma    Bipolar disorder ([place])    C. difficile diarrhea 2010   a.) following ABX course during hospital admission   Carotid atherosclerosis, bilateral    a.) Moderate; < 50% stenosis BILATERAL ICAs.   Cataract    a.) s/p BILATERAL extraction   Chicken pox    CKD (chronic kidney disease), stage III (HCC)    a. s/p R nephrectomy./ aneurysm   Conversion disorder    COPD (chronic obstructive pulmonary disease) (HCC)    Depression    Essential hypertension    GERD (gastroesophageal reflux disease)    Heart murmur    Hyperlipidemia    ILD (interstitial lung disease) (HCC)    mild; 2/2 RA diagnosis   Inflammatory arthritis    a. hands/carpal tunnel.  b. Low titer rheumatoid factor. c. Negative anti-CCP antibodies. d. Plaquenil.   Macular degeneration    Nocturnal hypoxemia    Non-Obstructive CAD    a. 07/2009 Cath ([facility]): nonobs dzs;  b. 03/2011 Cath [facility]): nonobs dzs.   Osteoarthritis  a. Knees.   PAD (peripheral artery disease) (HCC)    PUD (peptic ulcer disease)    S/P right hip fracture    11/01/16 s/p repair   Shoulder pain    Sleep apnea    no cpap / minimal   Spinal stenosis at L4-L5 level    severe with L4/L5 anterolisthesis grade 1 anterolisthesis    Toxic maculopathy    Valvular heart disease    a.) TTE 07/06/2015: EF 55-60%; Mild MR/AR/TR; Mild AS with MPG 13.0 mmHg.    Past Surgical History:  Procedure Laterality Date    APPENDECTOMY     APPLICATION OF INTRAOPERATIVE CT SCAN N/A 05/23/2021   Procedure: APPLICATION OF INTRAOPERATIVE CT SCAN;  Surgeon: Meade Maw, MD;  Location: [facility] ORS;  Service: Neurosurgery;  Laterality: N/A;   BACK SURGERY     lumbar fusion   BUNIONECTOMY Right    CATARACT EXTRACTION, BILATERAL     CESAREAN SECTION     x1   CHOLECYSTECTOMY N/A 05/11/2016   Procedure: LAPAROSCOPIC CHOLECYSTECTOMY;  Surgeon: Florene Glen, MD;  Location: [facility] ORS;  Service: General;  Laterality: N/A;   COLONOSCOPY WITH PROPOFOL N/A 04/02/2016   Procedure: COLONOSCOPY WITH PROPOFOL;  Surgeon: Jonathon Bellows, MD;  Location: [facility];  Service: Endoscopy;  Laterality: N/A;   ENDOSCOPIC RETROGRADE CHOLANGIOPANCREATOGRAPHY (ERCP) WITH PROPOFOL N/A 05/08/2016   Procedure: ENDOSCOPIC RETROGRADE CHOLANGIOPANCREATOGRAPHY (ERCP) WITH PROPOFOL;  Surgeon: Lucilla Lame, MD;  Location: [facility] END[facility];  Service: Endoscopy;  Laterality: N/A;   ERCP     with biliary spincterotomy 05/08/16 Dr. Allen Norris for choledocholithiasis    ESOPHAGEAL DILATION  04/02/2016   Procedure: ESOPHAGEAL DILATION;  Surgeon: Jonathon Bellows, MD;  Location: [facility];  Service: Endoscopy;;   ESOPHAGOGASTRODUODENOSCOPY (EGD) WITH PROPOFOL N/A 04/02/2016   Procedure: ESOPHAGOGASTRODUODENOSCOPY (EGD) WITH PROPOFOL;  Surgeon: Jonathon Bellows, MD;  Location: [facility];  Service: Endoscopy;  Laterality: N/A;   HIP ARTHROPLASTY Right 11/01/2016   Procedure: ARTHROPLASTY BIPOLAR HIP (HEMIARTHROPLASTY);  Surgeon: Corky Mull, MD;  Location: [facility] OR[facility];  Service: Orthopedics;  Laterality: Right;   LUMBAR LAMINECTOMY/DECOMPRESSION MICRODISCECTOMY N/A 12/11/2020   Procedure: LEFT L2-3 MICRODISCECTOMY, L3-4 AND L5-S1 DECOMPRESSION;  Surgeon: Meade Maw, MD;  Location: [facility];  Service: Neurosurgery;  Laterality: N/A;   NEPHRECTOMY  1988   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   ORIF SCAPHOID FRACTURE Left 12/21/2019    Procedure: OPEN REDUCTION INTERNAL FIXATION (ORIF) OF LEFT SCAPULAR NONUNION WITH BONE GRAFT;  Surgeon: Corky Mull, MD;  Location: [facility];  Service: Orthopedics;  Laterality: Left;   osteoporosis     noted DEXA 08/19/16    REPLACEMENT TOTAL KNEE Right    REVERSE SHOULDER ARTHROPLASTY Right 11/04/2017   Procedure: REVERSE SHOULDER ARTHROPLASTY;  Surgeon: Corky Mull, MD;  Location: [facility];  Service: Orthopedics;  Laterality: Right;   REVERSE SHOULDER ARTHROPLASTY Left 07/26/2018   Procedure: REVERSE SHOULDER ARTHROPLASTY;  Surgeon: Corky Mull, MD;  Location: [facility];  Service: Orthopedics;  Laterality: Left;   TONSILLECTOMY     TOTAL HIP ARTHROPLASTY  12/10/11   [facility] left hip   TOTAL HIP ARTHROPLASTY Bilateral    TRANSFORAMINAL LUMBAR INTERBODY FUSION (TLIF) WITH PEDICLE SCREW FIXATION 3 LEVEL N/A 05/23/2021   Procedure: OPEN L2-4 TRANSFORAMINAL LUMBAR INTERBODY FUSION (TLIF) WITH L2-5 POSTERIOR SPINAL FUSION;  Surgeon: Meade Maw, MD;  Location: [facility];  Service: Neurosurgery;  Laterality: N/A;   TUBAL LIGATION       Current Outpatient Medications:  albuterol (VENTOLIN HFA) 108 (90 Base) MCG/ACT inhaler, Inhale 2 puffs into the lungs every 6 (six) hours as needed for wheezing or shortness of breath., Disp: 18 g, Rfl: 11   amLODipine (NORVASC) 5 MG tablet, Take 1 tablet (5 mg total) by mouth daily., Disp: 90 tablet, Rfl: 3   apixaban (ELIQUIS) 5 MG TABS tablet, Take 1 tablet (5 mg total) by mouth 2 (two) times daily., Disp: 120 tablet, Rfl: 0   azithromycin (ZITHROMAX) 500 MG tablet, Take 1 tablet (500 mg total) by mouth daily., Disp: 3 tablet, Rfl: 0   budesonide (PULMICORT) 0.25 MG/2ML nebulizer solution, USE 1 VIAL  IN  NEBULIZER TWICE  DAILY - rinse mouth after treatment, Disp: 120 mL, Rfl: 11   busPIRone (BUSPAR) 5 MG tablet, Take 1 tablet (5 mg total) by mouth 2 (two) times daily., Disp: , Rfl:    Cholecalciferol (VITAMIN D3) 50 MCG (2000 UT) TABS, Take 4,000 Units  by mouth daily., Disp: 90 tablet, Rfl: 3   dicyclomine (BENTYL) 10 MG capsule, Take 1 capsule (10 mg total) by mouth 2 (two) times daily as needed for spasms. (Patient taking differently: Take 20 mg by mouth 2 (two) times daily.), Disp: 180 capsule, Rfl: 3   escitalopram (LEXAPRO) 10 MG tablet, Take 1 tablet (10 mg total) by mouth daily., Disp: 90 tablet, Rfl: 3   famotidine (PEPCID) 20 MG tablet, TAKE 1 TABLET (20 MG TOTAL) BY MOUTH DAILY. BEFORE BREAKFAST OR DINNER, Disp: 90 tablet, Rfl: 1   fluticasone (FLONASE) 50 MCG/ACT nasal spray, Place 2 sprays into both nostrils daily. In am, Disp: , Rfl:    gabapentin (NEURONTIN) 300 MG capsule, Take 2 capsules (600 mg total) by mouth in the morning and at bedtime., Disp: 360 capsule, Rfl: 3   guaiFENesin (MUCINEX) 600 MG 12 hr tablet, Take 1 tablet (600 mg total) by mouth 2 (two) times daily for 7 days., Disp: 14 tablet, Rfl: 0   ipratropium-albuterol (DUONEB) 0.5-2.5 (3) MG/3ML SOLN, Take 3 mLs by nebulization 3 (three) times daily as needed., Disp: 360 mL, Rfl: 3   lamoTRIgine (LAMICTAL) 100 MG tablet, TAKE 1 TAB 2 TIMES DAILY. FURTHER REFILLS NEW PSYCH FOR ALL PSYCH MEDS ONLY TEMP SUPPLY FROM PCP, Disp: 180 tablet, Rfl: 1   leflunomide (ARAVA) 20 MG tablet, Take 1 tablet (20 mg total) by mouth daily., Disp: , Rfl:    lidocaine (LIDODERM) 5 %, Place 1 patch onto the skin 2 (two) times daily as needed. Remove & Discard patch within 12 hours or as directed by MD (Patient taking differently: Place 1 patch onto the skin daily. Remove & Discard patch within 12 hours or as directed by MD), Disp: 120 patch, Rfl: 2   lovastatin (MEVACOR) 20 MG tablet, TAKE 1 TABLET BY MOUTH EVERYDAY AT BEDTIME *STOP TALKING '40MG'$ *, Disp: 90 tablet, Rfl: 2   methocarbamol (ROBAXIN) 500 MG tablet, Take 500 mg by mouth 4 (four) times daily as needed., Disp: , Rfl:    mirabegron ER (MYRBETRIQ) 50 MG TB24 tablet, Take 1 tablet (50 mg total) by mouth daily., Disp: 90 tablet, Rfl: 3    montelukast (SINGULAIR) 10 MG tablet, TAKE 1 TABLET BY MOUTH EVERY DAY, Disp: 90 tablet, Rfl: 1   multivitamin-lutein (OCUVITE-LUTEIN) CAPS capsule, Take 1 capsule by mouth at bedtime., Disp: , Rfl:    nebivolol (BYSTOLIC) 5 MG tablet, Take 1 tablet (5 mg total) by mouth in the morning and at bedtime. (Note dose changed from 1/2 10 mg bid to  5 mg bid), Disp: 180 tablet, Rfl: 3   oxyCODONE-acetaminophen (PERCOCET) 5-325 MG tablet, Take 1 tablet by mouth every 4 (four) hours as needed for severe pain., Disp: 15 tablet, Rfl: 0   OXYGEN, Inhale 2 L into the lungs at bedtime., Disp: , Rfl:    pantoprazole (PROTONIX) 40 MG tablet, TAKE 1 TABLET BY MOUTH 2 TIMES DAILY 30 MIN BEFORE FOOD (NOTE REDUCTION IN FREQUENCY), Disp: 180 tablet, Rfl: 1   QUEtiapine (SEROQUEL) 25 MG tablet, TAKE 1 TABLET (25 MG TOTAL) BY MOUTH AT BEDTIME. AGAIN LAST FILL FURTHER REFILLS FROM PSYCHIATRY NO EXCEPTIONS, Disp: 90 tablet, Rfl: 0   sucralfate (CARAFATE) 1 g tablet, Take 1 tablet (1 g total) by mouth 2 (two) times daily., Disp: 180 tablet, Rfl: 3   torsemide (DEMADEX) 20 MG tablet, Take 1 tablet (20 mg total) by mouth daily., Disp: 30 tablet, Rfl: 0   zoledronic acid (RECLAST) 5 MG/100ML SOLN injection, Inject 5 mg into the vein once., Disp: , Rfl:   EXAM:  VITALS per patient if applicable:  GENERAL: alert, oriented, appears well and in no acute distress  HEENT: atraumatic, conjunttiva clear, no obvious abnormalities on inspection of external nose and ears  NECK: normal movements of the head and neck  LUNGS: on inspection no signs of respiratory distress, breathing rate appears normal, no obvious gross SOB, gasping or wheezing  CV: no obvious cyanosis  MS: moves all visible extremities without noticeable abnormality  PSYCH/NEURO: pleasant and cooperative, no obvious depression or anxiety, speech and thought processing grossly intact  ASSESSMENT AND PLAN:  Discussed the following assessment and plan:  No  diagnosis found.  -we discussed possible serious and likely etiologies, options for evaluation and workup, limitations of telemedicine visit vs in person visit, treatment, treatment risks and precautions. Pt is agreeable to treatment via telemedicine at this moment.  Work/School slipped offered: provided in patient instructions  ***  declined Scheduled follow up with PCP offered: Declined  ***  Sent message to schedulers to assist and advised patient to contact PCP office to schedule if does not receive call back in next 24 hours. Advised to seek prompt virtual visit or in person care if worsening, new symptoms arise, or if is not improving with treatment as expected per our conversation of expected course. Discussed options for follow up care. Did let this patient know that I do telemedicine on Tuesdays and Thursdays for [facility] and those are the days I am logged into the system. Advised to schedule follow up visit with PCP, [facility] virtual visits or [facility] if any further questions or concerns to avoid delays in care.   I discussed the assessment and treatment plan with the patient. The patient was provided an opportunity to ask questions and all were answered. The patient agreed with the plan and demonstrated an understanding of the instructions.     [name], MD

## 2021-09-01 ENCOUNTER — Telehealth: Payer: Self-pay | Admitting: Internal Medicine

## 2021-09-01 DIAGNOSIS — R296 Repeated falls: Secondary | ICD-10-CM | POA: Insufficient documentation

## 2021-09-01 HISTORY — DX: Repeated falls: R29.6

## 2021-09-01 NOTE — Telephone Encounter (Signed)
Kerri Carter from [facility] called wanting to make sure that the provider received the Pt therapy fax order for continued physical therapy

## 2021-09-02 ENCOUNTER — Encounter: Payer: Self-pay | Admitting: Family

## 2021-09-02 ENCOUNTER — Ambulatory Visit: Payer: Medicare PPO | Attending: Family | Admitting: Family

## 2021-09-02 VITALS — BP 96/76 | HR 89 | Resp 16 | Ht 61.0 in | Wt 175.1 lb

## 2021-09-02 DIAGNOSIS — Z9049 Acquired absence of other specified parts of digestive tract: Secondary | ICD-10-CM | POA: Insufficient documentation

## 2021-09-02 DIAGNOSIS — I739 Peripheral vascular disease, unspecified: Secondary | ICD-10-CM | POA: Diagnosis not present

## 2021-09-02 DIAGNOSIS — F32A Depression, unspecified: Secondary | ICD-10-CM | POA: Diagnosis not present

## 2021-09-02 DIAGNOSIS — K219 Gastro-esophageal reflux disease without esophagitis: Secondary | ICD-10-CM | POA: Insufficient documentation

## 2021-09-02 DIAGNOSIS — R413 Other amnesia: Secondary | ICD-10-CM | POA: Diagnosis not present

## 2021-09-02 DIAGNOSIS — J849 Interstitial pulmonary disease, unspecified: Secondary | ICD-10-CM | POA: Insufficient documentation

## 2021-09-02 DIAGNOSIS — F419 Anxiety disorder, unspecified: Secondary | ICD-10-CM | POA: Diagnosis not present

## 2021-09-02 DIAGNOSIS — I10 Essential (primary) hypertension: Secondary | ICD-10-CM | POA: Diagnosis not present

## 2021-09-02 DIAGNOSIS — N183 Chronic kidney disease, stage 3 unspecified: Secondary | ICD-10-CM | POA: Insufficient documentation

## 2021-09-02 DIAGNOSIS — I2510 Atherosclerotic heart disease of native coronary artery without angina pectoris: Secondary | ICD-10-CM | POA: Diagnosis not present

## 2021-09-02 DIAGNOSIS — I130 Hypertensive heart and chronic kidney disease with heart failure and stage 1 through stage 4 chronic kidney disease, or unspecified chronic kidney disease: Secondary | ICD-10-CM | POA: Insufficient documentation

## 2021-09-02 DIAGNOSIS — I350 Nonrheumatic aortic (valve) stenosis: Secondary | ICD-10-CM | POA: Insufficient documentation

## 2021-09-02 DIAGNOSIS — I480 Paroxysmal atrial fibrillation: Secondary | ICD-10-CM | POA: Insufficient documentation

## 2021-09-02 DIAGNOSIS — I5032 Chronic diastolic (congestive) heart failure: Secondary | ICD-10-CM | POA: Diagnosis not present

## 2021-09-02 DIAGNOSIS — J440 Chronic obstructive pulmonary disease with acute lower respiratory infection: Secondary | ICD-10-CM | POA: Diagnosis not present

## 2021-09-02 DIAGNOSIS — J449 Chronic obstructive pulmonary disease, unspecified: Secondary | ICD-10-CM | POA: Diagnosis not present

## 2021-09-02 DIAGNOSIS — E785 Hyperlipidemia, unspecified: Secondary | ICD-10-CM | POA: Insufficient documentation

## 2021-09-02 NOTE — Patient Instructions (Addendum)
Continue weighing daily and call for an overnight weight gain of 3 pounds or more or a weekly weight gain of more than 5 pounds.   If you have voicemail, please make sure your mailbox is cleaned out so that we may leave a message and please make sure to listen to any voicemails.    Keep daily sodium intake to '2000mg'$     Do not take your amlodipine today

## 2021-09-02 NOTE — Progress Notes (Signed)
Patient ID: Kerri Carter, female    DOB: 10/19/1940, 81 y.o.   MRN: [id number]  HPI  Kerri Carter is a 81 y/o female with a history of asthma, PAF, carotid disease, CAD, hyperlipidemia, HTN, CKD, anxiety, aortic stenosis, bipolar, conversion disorder, depression, GERD, ILD, macular degeneration, PAD, previous tobacco use and chronic heart failure.   Echo report from 07/29/21 reviewed and showed an EF of 55-60% along with moderate/severe MR but no LVH.   Admitted 08/20/21 due to worsening shortness of breath. Treated with antibiotics, IV steroids and nebulizers with improvement of symptoms. Discharged after 2 days. Admitted 07/28/21 due to shortness of breath. Found to be hypoxic and given nebulizer treatment and placed on bipap. Weaned off bipap to her oxygen at 2L. Cardiology consult obtained. Initially given IV lasix with transition to oral diuretic. PT evaluation done. Finishing prednisone taper. Discharged after 3 days. Admitted 07/22/21 due to shortness of breath and increased cough due to COPD exacerbation & pneumonia. Given antibiotics and prednisone taper. PT evaluation done. Discharged after 2 days.   She presents today for her initial visit with a chief complaint of moderate shortness of breath with minimal exertion. Describes this as chronic in nature. She has associated fatigue, cough, pedal edema and easy bruising along with this. She denies any difficult sleeping, abdominal distention, palpitations, chest pain or dizziness.   Not weighing daily but does have scales. Says that she put the scales on the linoleum of her kitchen and she slipped when stepping on them so she hasn't used them since. Says that her bathroom has carpet in it. Has BP cuff as well from [address] monitoring program that she says she isn't using either.   Past Medical History:  Diagnosis Date   Anxiety    Aortic stenosis 07/09/2015   a.) TTE 07/06/2015: EF 55-60%; mild AS with MPG 13.0 mmHg.   Arrhythmia     atrial fibrillation   Asthma    Bipolar disorder ([place])    C. difficile diarrhea 2010   a.) following ABX course during hospital admission   Carotid atherosclerosis, bilateral    a.) Moderate; < 50% stenosis BILATERAL ICAs.   Cataract    a.) s/p BILATERAL extraction   CHF (congestive heart failure) (HCC)    Chicken pox    CKD (chronic kidney disease), stage III (HCC)    a. s/p R nephrectomy./ aneurysm   Conversion disorder    COPD (chronic obstructive pulmonary disease) (HCC)    Depression    Essential hypertension    GERD (gastroesophageal reflux disease)    Heart murmur    Hyperlipidemia    ILD (interstitial lung disease) (HCC)    mild; 2/2 RA diagnosis   Inflammatory arthritis    a. hands/carpal tunnel.  b. Low titer rheumatoid factor. c. Negative anti-CCP antibodies. d. Plaquenil.   Macular degeneration    Nocturnal hypoxemia    Non-Obstructive CAD    a. 07/2009 Cath ([facility]): nonobs dzs;  b. 03/2011 Cath [facility]): nonobs dzs.   Osteoarthritis    a. Knees.   PAD (peripheral artery disease) (HCC)    PUD (peptic ulcer disease)    S/P right hip fracture    11/01/16 s/p repair   Shoulder pain    Sleep apnea    no cpap / minimal   Spinal stenosis at L4-L5 level    severe with L4/L5 anterolisthesis grade 1 anterolisthesis    Toxic maculopathy    Valvular heart disease    a.) TTE  07/06/2015: EF 55-60%; Mild MR/AR/TR; Mild AS with MPG 13.0 mmHg.   Past Surgical History:  Procedure Laterality Date   APPENDECTOMY     APPLICATION OF INTRAOPERATIVE CT SCAN N/A 05/23/2021   Procedure: APPLICATION OF INTRAOPERATIVE CT SCAN;  Surgeon: Meade Maw, MD;  Location: [facility];  Service: Neurosurgery;  Laterality: N/A;   BACK SURGERY     lumbar fusion   BUNIONECTOMY Right    CATARACT EXTRACTION, BILATERAL     CESAREAN SECTION     x1   CHOLECYSTECTOMY N/A 05/11/2016   Procedure: LAPAROSCOPIC CHOLECYSTECTOMY;  Surgeon: Florene Glen, MD;  Location: [facility];  Service: General;   Laterality: N/A;   COLONOSCOPY WITH PROPOFOL N/A 04/02/2016   Procedure: COLONOSCOPY WITH PROPOFOL;  Surgeon: Jonathon Bellows, MD;  Location: [facility];  Service: Endoscopy;  Laterality: N/A;   ENDOSCOPIC RETROGRADE CHOLANGIOPANCREATOGRAPHY (ERCP) WITH PROPOFOL N/A 05/08/2016   Procedure: ENDOSCOPIC RETROGRADE CHOLANGIOPANCREATOGRAPHY (ERCP) WITH PROPOFOL;  Surgeon: Lucilla Lame, MD;  Location: [facility];  Service: Endoscopy;  Laterality: N/A;   ERCP     with biliary spincterotomy 05/08/16 Dr. Allen Norris for choledocholithiasis    ESOPHAGEAL DILATION  04/02/2016   Procedure: ESOPHAGEAL DILATION;  Surgeon: Jonathon Bellows, MD;  Location: [facility];  Service: Endoscopy;;   ESOPHAGOGASTRODUODENOSCOPY (EGD) WITH PROPOFOL N/A 04/02/2016   Procedure: ESOPHAGOGASTRODUODENOSCOPY (EGD) WITH PROPOFOL;  Surgeon: Jonathon Bellows, MD;  Location: [facility];  Service: Endoscopy;  Laterality: N/A;   HIP ARTHROPLASTY Right 11/01/2016   Procedure: ARTHROPLASTY BIPOLAR HIP (HEMIARTHROPLASTY);  Surgeon: Corky Mull, MD;  Location: [facility];  Service: Orthopedics;  Laterality: Right;   LUMBAR LAMINECTOMY/DECOMPRESSION MICRODISCECTOMY N/A 12/11/2020   Procedure: LEFT L2-3 MICRODISCECTOMY, L3-4 AND L5-S1 DECOMPRESSION;  Surgeon: Meade Maw, MD;  Location: [facility];  Service: Neurosurgery;  Laterality: N/A;   NEPHRECTOMY  1988   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   ORIF SCAPHOID FRACTURE Left 12/21/2019   Procedure: OPEN REDUCTION INTERNAL FIXATION (ORIF) OF LEFT SCAPULAR NONUNION WITH BONE GRAFT;  Surgeon: Corky Mull, MD;  Location: [facility];  Service: Orthopedics;  Laterality: Left;   osteoporosis     noted DEXA 08/19/16    REPLACEMENT TOTAL KNEE Right    REVERSE SHOULDER ARTHROPLASTY Right 11/04/2017   Procedure: REVERSE SHOULDER ARTHROPLASTY;  Surgeon: Corky Mull, MD;  Location: [facility];  Service: Orthopedics;  Laterality: Right;   REVERSE SHOULDER ARTHROPLASTY Left 07/26/2018    Procedure: REVERSE SHOULDER ARTHROPLASTY;  Surgeon: Corky Mull, MD;  Location: [facility];  Service: Orthopedics;  Laterality: Left;   TONSILLECTOMY     TOTAL HIP ARTHROPLASTY  12/10/11   [facility] left hip   TOTAL HIP ARTHROPLASTY Bilateral    TRANSFORAMINAL LUMBAR INTERBODY FUSION (TLIF) WITH PEDICLE SCREW FIXATION 3 LEVEL N/A 05/23/2021   Procedure: OPEN L2-4 TRANSFORAMINAL LUMBAR INTERBODY FUSION (TLIF) WITH L2-5 POSTERIOR SPINAL FUSION;  Surgeon: Meade Maw, MD;  Location: [facility];  Service: Neurosurgery;  Laterality: N/A;   TUBAL LIGATION     Family History  Problem Relation Age of Onset   Rheum arthritis Mother    Asthma Mother    Parkinson's disease Mother    Heart disease Mother    Stroke Mother    Hypertension Mother    Heart attack Father    Heart disease Father    Hypertension Father    Peripheral Artery Disease Father    Diabetes Son    Gout Son    Asthma Sister    Heart disease  Sister    Lung cancer Sister    Heart disease Sister    Heart disease Sister    Breast cancer Sister    Heart attack Sister    Heart disease Brother    Heart disease Maternal Grandmother    Diabetes Maternal Grandmother    Colon cancer Maternal Grandmother    Cancer Maternal Grandmother        Hodgkins lymphoma   Heart disease Brother    Alcohol abuse Brother    Depression Brother    Dementia Son    Social History   Tobacco Use   Smoking status: Former    Packs/day: 0.50    Years: 20.00    Total pack years: 10.00    Types: Cigarettes    Quit date: 02/02/1974    Years since quitting: 47.6   Smokeless tobacco: Never  Substance Use Topics   Alcohol use: No   Allergies  Allergen Reactions   Ceftin [Cefuroxime Axetil] Anaphylaxis   Lisinopril Anaphylaxis   Sulfa Antibiotics Other (See Comments)    Face swelling   Sulfasalazine Anaphylaxis   Morphine Other (See Comments)    Per patient, low blood pressure issues that requires action to raise it back up. Can take small  infrequent doses   Xarelto [Rivaroxaban] Other (See Comments)    Stomach burning, bleeding, and tar in stool   Adhesive [Tape] Rash    Paper tape and tega derm OK   Antihistamines, Chlorpheniramine-Type Other (See Comments)    Makes pt hyper   Antivert [Meclizine Hcl] Other (See Comments)    Bladder will not empty   Aspirin Other (See Comments)    Sulfasalazine allergy cross reacts   Contrast Media [Iodinated Contrast Media] Rash    she is able to use betadine scrubs.   Decongestant [Pseudoephedrine Hcl] Other (See Comments)    Makes pt hyper   Doxycycline Other (See Comments)    GI upset   Levaquin [Levofloxacin In D5w] Rash   Polymyxin B Other (See Comments)    Facial rash   Tetanus Toxoids Rash and Other (See Comments)    Fever and hot to touch at injection site   Prior to Admission medications   Medication Sig Start Date End Date Taking? Authorizing Provider  albuterol (VENTOLIN HFA) 108 (90 Base) MCG/ACT inhaler Inhale 2 puffs into the lungs every 6 (six) hours as needed for wheezing or shortness of breath. 06/24/21  Yes McLean-Scocuzza, Nino Glow, MD  amLODipine (NORVASC) 5 MG tablet Take 1 tablet (5 mg total) by mouth daily. 08/01/21  Yes McLean-Scocuzza, Nino Glow, MD  apixaban (ELIQUIS) 5 MG TABS tablet Take 1 tablet (5 mg total) by mouth 2 (two) times daily. 07/31/21 09/29/21 Yes Sreenath, Sudheer B, MD  budesonide (PULMICORT) 0.25 MG/2ML nebulizer solution USE 1 VIAL  IN  NEBULIZER TWICE  DAILY - rinse mouth after treatment 08/13/21  Yes [name], MD  busPIRone (BUSPAR) 5 MG tablet Take 1 tablet (5 mg total) by mouth 2 (two) times daily. 06/02/21  Yes Jennye Boroughs, MD  Cholecalciferol (VITAMIN D3) 50 MCG (2000 UT) TABS Take 4,000 Units by mouth daily. 08/01/21  Yes McLean-Scocuzza, Nino Glow, MD  dicyclomine (BENTYL) 10 MG capsule Take 1 capsule (10 mg total) by mouth 2 (two) times daily as needed for spasms. Patient taking differently: Take 20 mg by mouth 2 (two) times  daily. 10/01/20 09/26/21 Yes Jonathon Bellows, MD  escitalopram (LEXAPRO) 10 MG tablet Take 1 tablet (10 mg total) by mouth  daily. 08/01/21  Yes McLean-Scocuzza, Nino Glow, MD  famotidine (PEPCID) 20 MG tablet TAKE 1 TABLET (20 MG TOTAL) BY MOUTH DAILY. BEFORE BREAKFAST OR DINNER 03/24/21  Yes McLean-Scocuzza, Nino Glow, MD  fluticasone (FLONASE) 50 MCG/ACT nasal spray Place 2 sprays into both nostrils daily. In am 12/08/19  Yes McLean-Scocuzza, Nino Glow, MD  gabapentin (NEURONTIN) 300 MG capsule Take 2 capsules (600 mg total) by mouth in the morning and at bedtime. 09/03/20  Yes McLean-Scocuzza, Nino Glow, MD  ipratropium-albuterol (DUONEB) 0.5-2.5 (3) MG/3ML SOLN Take 3 mLs by nebulization 3 (three) times daily as needed. 08/13/21  Yes [name], MD  lamoTRIgine (LAMICTAL) 100 MG tablet TAKE 1 TAB 2 TIMES DAILY. FURTHER REFILLS NEW PSYCH FOR ALL PSYCH MEDS ONLY TEMP SUPPLY FROM PCP 05/02/21  Yes McLean-Scocuzza, Nino Glow, MD  leflunomide (ARAVA) 20 MG tablet Take 1 tablet (20 mg total) by mouth daily. 09/30/17  Yes McLean-Scocuzza, Nino Glow, MD  lidocaine (LIDODERM) 5 % Place 1 patch onto the skin 2 (two) times daily as needed. Remove & Discard patch within 12 hours or as directed by MD Patient taking differently: Place 1 patch onto the skin daily. Remove & Discard patch within 12 hours or as directed by MD 11/22/20  Yes McLean-Scocuzza, Nino Glow, MD  lovastatin (MEVACOR) 20 MG tablet TAKE 1 TABLET BY MOUTH EVERYDAY AT BEDTIME *STOP TALKING '40MG'$ * 08/01/21  Yes McLean-Scocuzza, Nino Glow, MD  methocarbamol (ROBAXIN) 500 MG tablet Take 500 mg by mouth 4 (four) times daily as needed. Taking twice a day currently 01/23/21  Yes [provider]  mirabegron ER (MYRBETRIQ) 50 MG TB24 tablet Take 1 tablet (50 mg total) by mouth daily. 12/04/20 11/29/21 Yes Vaillancourt, Samantha, PA-C  montelukast (SINGULAIR) 10 MG tablet TAKE 1 TABLET BY MOUTH EVERY DAY 03/24/21  Yes McLean-Scocuzza, Nino Glow, MD  multivitamin-lutein  (OCUVITE-LUTEIN) CAPS capsule Take 1 capsule by mouth at bedtime.   Yes [provider]  nebivolol (BYSTOLIC) 5 MG tablet Take 1 tablet (5 mg total) by mouth in the morning and at bedtime. (Note dose changed from 1/2 10 mg bid to 5 mg bid) 08/01/21  Yes McLean-Scocuzza, Nino Glow, MD  OXYGEN Inhale 2 L into the lungs at bedtime.   Yes [provider]  pantoprazole (PROTONIX) 40 MG tablet TAKE 1 TABLET BY MOUTH 2 TIMES DAILY 30 MIN BEFORE FOOD (NOTE REDUCTION IN FREQUENCY) 07/07/21  Yes McLean-Scocuzza, Nino Glow, MD  QUEtiapine (SEROQUEL) 25 MG tablet TAKE 1 TABLET (25 MG TOTAL) BY MOUTH AT BEDTIME. AGAIN LAST FILL FURTHER REFILLS FROM PSYCHIATRY NO EXCEPTIONS 09/13/20  Yes McLean-Scocuzza, Nino Glow, MD  sucralfate (CARAFATE) 1 g tablet Take 1 tablet (1 g total) by mouth 2 (two) times daily. 09/17/20 09/12/21 Yes Jonathon Bellows, MD  torsemide (DEMADEX) 20 MG tablet Take 1 tablet (20 mg total) by mouth daily. 08/01/21  Yes Sreenath, Sudheer B, MD  zoledronic acid (RECLAST) 5 MG/100ML SOLN injection Inject 5 mg into the vein once.   Yes [provider]  oxyCODONE-acetaminophen (PERCOCET) 5-325 MG tablet Take 1 tablet by mouth every 4 (four) hours as needed for severe pain. Patient not taking: Reported on 09/02/2021 07/17/21   Harvest Dark, MD   Review of Systems  Constitutional:  Positive for fatigue. Negative for appetite change.  HENT:  Negative for congestion, postnasal drip and sore throat.   Eyes: Negative.   Respiratory:  Positive for cough (productive at times) and shortness of breath (easily). Negative for chest tightness and  wheezing.   Cardiovascular:  Positive for leg swelling. Negative for chest pain and palpitations.  Gastrointestinal:  Negative for abdominal distention and abdominal pain.  Endocrine: Negative.   Genitourinary: Negative.   Musculoskeletal:  Negative for back pain and neck pain.  Skin: Negative.   Allergic/Immunologic: Negative.   Neurological:   Negative for dizziness and light-headedness.  Hematological:  Negative for adenopathy. Bruises/bleeds easily.  Psychiatric/Behavioral:  Negative for dysphoric mood and sleep disturbance (sleeping on 1 pillow with HOB elevated along with oxygen). The patient is not nervous/anxious.     Vitals:   09/02/21 1145  BP: 96/76  Pulse: 89  Resp: 16  SpO2: 95%  Weight: 175 lb 2 oz (79.4 kg)  Height: '5\' 1"'$  (1.549 m)   Wt Readings from Last 3 Encounters:  09/02/21 175 lb 2 oz (79.4 kg)  08/29/21 167 lb (75.8 kg)  08/20/21 175 lb (79.4 kg)   Lab Results  Component Value Date   CREATININE 1.36 (H) 08/22/2021   CREATININE 1.28 (H) 08/21/2021   CREATININE 1.52 (H) 08/20/2021   Physical Exam Vitals and nursing note reviewed.  Constitutional:      Appearance: She is well-developed.  HENT:     Head: Normocephalic and atraumatic.  Neck:     Vascular: No JVD.  Cardiovascular:     Rate and Rhythm: Normal rate and regular rhythm.  Pulmonary:     Effort: Pulmonary effort is normal. No accessory muscle usage.     Breath sounds: No wheezing, rhonchi or rales.  Abdominal:     Palpations: Abdomen is soft.     Tenderness: There is no abdominal tenderness.  Musculoskeletal:     Cervical back: Normal range of motion and neck supple.     Right lower leg: Tenderness (trace pitting) present.     Left lower leg: Tenderness (trace pitting) present. No edema.  Skin:    General: Skin is warm and dry.  Neurological:     General: No focal deficit present.     Mental Status: She is alert and oriented to person, place, and time.  Psychiatric:        Mood and Affect: Mood normal.        Behavior: Behavior normal.    Assessment & Plan:  1: Chronic heart failure with preserved ejection fraction without structural changes- - NYHA class III - euvolemic today - not weighing daily because she has fallen getting on scales; encouraged her to straddle the scale with her walker or put double sided tape on the  legs of the scale; instructed her to call for an overnight weight gain of > 2 pounds or a weekly weight gain of > 5 pounds - not adding salt and reviewed the importance of keeping daily sodium intake to '2000mg'$  - drinking ~ 60-64 ounces of fluid daily - saw cardiology (Povsic) 05/16/21 - she says that she is supposed to be having repeat echo done - having home PT twice/week - participating with [place] HF monitoring program although she says she hasn't heard from them; will ask nurse navigator to reach out to [facility] and ask them to f/u with patient - BNP 08/20/21 was 410.9  2: HTN with CKD- - BP low (96/76); instructed to not take her amlodipine today as she hasn't taken any of her medications yet today - had video visit with PCP (McLean-Scocuzza) 08/29/21 - BMP 08/22/21 reviewed and showed sodium 140, potassium 4.0, creatinine 1.36 & GFR 40  3: COPD- - saw pulmonology (  Patsey Berthold) 06/26/21 - wearing oxygen at 3L  4: Anxiety/ depression- - reports feeling this is stable at this time  5: Memory loss- - saw neurology (Paich) 07/15/21   Patient did not bring her medications nor a list. Each medication was verbally reviewed with the patient and she was encouraged to bring the bottles to every visit to confirm accuracy of list.   Return in 1 month, sooner if needed.

## 2021-09-03 NOTE — Telephone Encounter (Signed)
[name] from [facility] called wanting to know if provider received Physical Therapy and OT orders.  She stated she faxed again on 07/31.  Patient is at high risk for going back to the hospital .   [phone]

## 2021-09-05 ENCOUNTER — Other Ambulatory Visit: Payer: Medicare PPO

## 2021-09-07 ENCOUNTER — Other Ambulatory Visit: Payer: Self-pay | Admitting: Internal Medicine

## 2021-09-07 DIAGNOSIS — R059 Cough, unspecified: Secondary | ICD-10-CM

## 2021-09-08 ENCOUNTER — Inpatient Hospital Stay: Payer: Medicare PPO

## 2021-09-08 ENCOUNTER — Ambulatory Visit: Payer: Self-pay

## 2021-09-08 ENCOUNTER — Other Ambulatory Visit: Payer: Self-pay

## 2021-09-08 ENCOUNTER — Inpatient Hospital Stay
Admission: EM | Admit: 2021-09-08 | Discharge: 2021-09-12 | DRG: 291 | Disposition: A | Discharge status: Home Health | Payer: Medicare PPO | Attending: Family Medicine | Admitting: Family Medicine

## 2021-09-08 ENCOUNTER — Emergency Department: Payer: Medicare PPO

## 2021-09-08 DIAGNOSIS — N1832 Chronic kidney disease, stage 3b: Secondary | ICD-10-CM | POA: Diagnosis present

## 2021-09-08 DIAGNOSIS — R0902 Hypoxemia: Secondary | ICD-10-CM | POA: Diagnosis not present

## 2021-09-08 DIAGNOSIS — J9811 Atelectasis: Secondary | ICD-10-CM | POA: Diagnosis present

## 2021-09-08 DIAGNOSIS — I350 Nonrheumatic aortic (valve) stenosis: Secondary | ICD-10-CM | POA: Diagnosis present

## 2021-09-08 DIAGNOSIS — Z96611 Presence of right artificial shoulder joint: Secondary | ICD-10-CM | POA: Diagnosis present

## 2021-09-08 DIAGNOSIS — M79671 Pain in right foot: Secondary | ICD-10-CM | POA: Diagnosis not present

## 2021-09-08 DIAGNOSIS — D509 Iron deficiency anemia, unspecified: Secondary | ICD-10-CM | POA: Diagnosis present

## 2021-09-08 DIAGNOSIS — I2510 Atherosclerotic heart disease of native coronary artery without angina pectoris: Secondary | ICD-10-CM | POA: Diagnosis present

## 2021-09-08 DIAGNOSIS — E785 Hyperlipidemia, unspecified: Secondary | ICD-10-CM | POA: Diagnosis present

## 2021-09-08 DIAGNOSIS — Z9842 Cataract extraction status, left eye: Secondary | ICD-10-CM | POA: Diagnosis not present

## 2021-09-08 DIAGNOSIS — Z20822 Contact with and (suspected) exposure to covid-19: Secondary | ICD-10-CM | POA: Diagnosis present

## 2021-09-08 DIAGNOSIS — Z7901 Long term (current) use of anticoagulants: Secondary | ICD-10-CM

## 2021-09-08 DIAGNOSIS — Z96651 Presence of right artificial knee joint: Secondary | ICD-10-CM | POA: Diagnosis present

## 2021-09-08 DIAGNOSIS — I480 Paroxysmal atrial fibrillation: Secondary | ICD-10-CM | POA: Diagnosis not present

## 2021-09-08 DIAGNOSIS — Z66 Do not resuscitate: Secondary | ICD-10-CM | POA: Diagnosis present

## 2021-09-08 DIAGNOSIS — I130 Hypertensive heart and chronic kidney disease with heart failure and stage 1 through stage 4 chronic kidney disease, or unspecified chronic kidney disease: Secondary | ICD-10-CM | POA: Diagnosis present

## 2021-09-08 DIAGNOSIS — I739 Peripheral vascular disease, unspecified: Secondary | ICD-10-CM | POA: Diagnosis present

## 2021-09-08 DIAGNOSIS — R0602 Shortness of breath: Secondary | ICD-10-CM | POA: Diagnosis not present

## 2021-09-08 DIAGNOSIS — M069 Rheumatoid arthritis, unspecified: Secondary | ICD-10-CM | POA: Diagnosis present

## 2021-09-08 DIAGNOSIS — F3160 Bipolar disorder, current episode mixed, unspecified: Secondary | ICD-10-CM | POA: Diagnosis present

## 2021-09-08 DIAGNOSIS — I10 Essential (primary) hypertension: Secondary | ICD-10-CM | POA: Diagnosis present

## 2021-09-08 DIAGNOSIS — Z885 Allergy status to narcotic agent status: Secondary | ICD-10-CM

## 2021-09-08 DIAGNOSIS — R0689 Other abnormalities of breathing: Secondary | ICD-10-CM | POA: Diagnosis not present

## 2021-09-08 DIAGNOSIS — J9621 Acute and chronic respiratory failure with hypoxia: Secondary | ICD-10-CM | POA: Diagnosis not present

## 2021-09-08 DIAGNOSIS — J90 Pleural effusion, not elsewhere classified: Secondary | ICD-10-CM | POA: Diagnosis not present

## 2021-09-08 DIAGNOSIS — K219 Gastro-esophageal reflux disease without esophagitis: Secondary | ICD-10-CM | POA: Diagnosis present

## 2021-09-08 DIAGNOSIS — R5381 Other malaise: Secondary | ICD-10-CM | POA: Diagnosis present

## 2021-09-08 DIAGNOSIS — Z825 Family history of asthma and other chronic lower respiratory diseases: Secondary | ICD-10-CM

## 2021-09-08 DIAGNOSIS — Z8261 Family history of arthritis: Secondary | ICD-10-CM

## 2021-09-08 DIAGNOSIS — I5033 Acute on chronic diastolic (congestive) heart failure: Secondary | ICD-10-CM | POA: Diagnosis present

## 2021-09-08 DIAGNOSIS — E872 Acidosis, unspecified: Secondary | ICD-10-CM | POA: Diagnosis present

## 2021-09-08 DIAGNOSIS — S92514A Nondisplaced fracture of proximal phalanx of right lesser toe(s), initial encounter for closed fracture: Secondary | ICD-10-CM | POA: Diagnosis not present

## 2021-09-08 DIAGNOSIS — J811 Chronic pulmonary edema: Secondary | ICD-10-CM | POA: Diagnosis not present

## 2021-09-08 DIAGNOSIS — J440 Chronic obstructive pulmonary disease with acute lower respiratory infection: Secondary | ICD-10-CM | POA: Diagnosis present

## 2021-09-08 DIAGNOSIS — Z9841 Cataract extraction status, right eye: Secondary | ICD-10-CM

## 2021-09-08 DIAGNOSIS — Z91041 Radiographic dye allergy status: Secondary | ICD-10-CM

## 2021-09-08 DIAGNOSIS — J189 Pneumonia, unspecified organism: Secondary | ICD-10-CM | POA: Diagnosis not present

## 2021-09-08 DIAGNOSIS — Z820 Family history of epilepsy and other diseases of the nervous system: Secondary | ICD-10-CM

## 2021-09-08 DIAGNOSIS — Z882 Allergy status to sulfonamides status: Secondary | ICD-10-CM

## 2021-09-08 DIAGNOSIS — Z823 Family history of stroke: Secondary | ICD-10-CM

## 2021-09-08 DIAGNOSIS — M25551 Pain in right hip: Secondary | ICD-10-CM | POA: Diagnosis not present

## 2021-09-08 DIAGNOSIS — J9620 Acute and chronic respiratory failure, unspecified whether with hypoxia or hypercapnia: Secondary | ICD-10-CM | POA: Diagnosis not present

## 2021-09-08 DIAGNOSIS — Z981 Arthrodesis status: Secondary | ICD-10-CM

## 2021-09-08 DIAGNOSIS — N1831 Chronic kidney disease, stage 3a: Secondary | ICD-10-CM | POA: Diagnosis present

## 2021-09-08 DIAGNOSIS — R296 Repeated falls: Secondary | ICD-10-CM | POA: Diagnosis present

## 2021-09-08 DIAGNOSIS — S92911A Unspecified fracture of right toe(s), initial encounter for closed fracture: Secondary | ICD-10-CM | POA: Diagnosis present

## 2021-09-08 DIAGNOSIS — Z87891 Personal history of nicotine dependence: Secondary | ICD-10-CM

## 2021-09-08 DIAGNOSIS — Z96612 Presence of left artificial shoulder joint: Secondary | ICD-10-CM | POA: Diagnosis present

## 2021-09-08 DIAGNOSIS — J441 Chronic obstructive pulmonary disease with (acute) exacerbation: Secondary | ICD-10-CM | POA: Diagnosis not present

## 2021-09-08 DIAGNOSIS — F419 Anxiety disorder, unspecified: Secondary | ICD-10-CM | POA: Diagnosis present

## 2021-09-08 DIAGNOSIS — Z833 Family history of diabetes mellitus: Secondary | ICD-10-CM

## 2021-09-08 DIAGNOSIS — Z8249 Family history of ischemic heart disease and other diseases of the circulatory system: Secondary | ICD-10-CM

## 2021-09-08 DIAGNOSIS — J849 Interstitial pulmonary disease, unspecified: Secondary | ICD-10-CM | POA: Diagnosis present

## 2021-09-08 DIAGNOSIS — Z7951 Long term (current) use of inhaled steroids: Secondary | ICD-10-CM

## 2021-09-08 DIAGNOSIS — J9622 Acute and chronic respiratory failure with hypercapnia: Secondary | ICD-10-CM

## 2021-09-08 DIAGNOSIS — Z79899 Other long term (current) drug therapy: Secondary | ICD-10-CM

## 2021-09-08 DIAGNOSIS — I959 Hypotension, unspecified: Secondary | ICD-10-CM | POA: Diagnosis not present

## 2021-09-08 DIAGNOSIS — R062 Wheezing: Secondary | ICD-10-CM | POA: Diagnosis not present

## 2021-09-08 DIAGNOSIS — W19XXXA Unspecified fall, initial encounter: Secondary | ICD-10-CM | POA: Diagnosis present

## 2021-09-08 DIAGNOSIS — Z96643 Presence of artificial hip joint, bilateral: Secondary | ICD-10-CM | POA: Diagnosis present

## 2021-09-08 LAB — BRAIN NATRIURETIC PEPTIDE: B Natriuretic Peptide: 345.9 pg/mL — ABNORMAL HIGH (ref 0.0–100.0)

## 2021-09-08 LAB — CBC WITH DIFFERENTIAL/PLATELET
Abs Immature Granulocytes: 0.1 10*3/uL — ABNORMAL HIGH (ref 0.00–0.07)
Basophils Absolute: 0.1 10*3/uL (ref 0.0–0.1)
Basophils Relative: 1 %
Eosinophils Absolute: 1.3 10*3/uL — ABNORMAL HIGH (ref 0.0–0.5)
Eosinophils Relative: 7 %
HCT: 30.8 % — ABNORMAL LOW (ref 36.0–46.0)
Hemoglobin: 8.8 g/dL — ABNORMAL LOW (ref 12.0–15.0)
Immature Granulocytes: 1 %
Lymphocytes Relative: 22 %
Lymphs Abs: 4 10*3/uL (ref 0.7–4.0)
MCH: 23 pg — ABNORMAL LOW (ref 26.0–34.0)
MCHC: 28.6 g/dL — ABNORMAL LOW (ref 30.0–36.0)
MCV: 80.4 fL (ref 80.0–100.0)
Monocytes Absolute: 0.9 10*3/uL (ref 0.1–1.0)
Monocytes Relative: 5 %
Neutro Abs: 11.7 10*3/uL — ABNORMAL HIGH (ref 1.7–7.7)
Neutrophils Relative %: 64 %
Platelets: 265 10*3/uL (ref 150–400)
RBC: 3.83 MIL/uL — ABNORMAL LOW (ref 3.87–5.11)
RDW: 17.2 % — ABNORMAL HIGH (ref 11.5–15.5)
WBC: 18.1 10*3/uL — ABNORMAL HIGH (ref 4.0–10.5)
nRBC: 0 % (ref 0.0–0.2)

## 2021-09-08 LAB — COMPREHENSIVE METABOLIC PANEL
ALT: 16 U/L (ref 0–44)
AST: 24 U/L (ref 15–41)
Albumin: 3.3 g/dL — ABNORMAL LOW (ref 3.5–5.0)
Alkaline Phosphatase: 98 U/L (ref 38–126)
Anion gap: 9 (ref 5–15)
BUN: 19 mg/dL (ref 8–23)
CO2: 24 mmol/L (ref 22–32)
Calcium: 8.4 mg/dL — ABNORMAL LOW (ref 8.9–10.3)
Chloride: 106 mmol/L (ref 98–111)
Creatinine, Ser: 1.34 mg/dL — ABNORMAL HIGH (ref 0.44–1.00)
GFR, Estimated: 41 mL/min — ABNORMAL LOW (ref >60)
Glucose, Bld: 159 mg/dL — ABNORMAL HIGH (ref 70–99)
Potassium: 4.3 mmol/L (ref 3.5–5.1)
Sodium: 139 mmol/L (ref 135–145)
Total Bilirubin: 0.6 mg/dL (ref 0.3–1.2)
Total Protein: 6.7 g/dL (ref 6.5–8.1)

## 2021-09-08 LAB — BLOOD GAS, ARTERIAL
Acid-Base Excess: 2 mmol/L (ref 0.0–2.0)
Bicarbonate: 26.5 mmol/L (ref 20.0–28.0)
O2 Content: 3 L/min
O2 Saturation: 94.8 %
Patient temperature: 37
pCO2 arterial: 40 mmHg (ref 32–48)
pH, Arterial: 7.43 (ref 7.35–7.45)
pO2, Arterial: 67 mmHg — ABNORMAL LOW (ref 83–108)

## 2021-09-08 LAB — SARS CORONAVIRUS 2 BY RT PCR: SARS Coronavirus 2 by RT PCR: NEGATIVE

## 2021-09-08 LAB — URINALYSIS, ROUTINE W REFLEX MICROSCOPIC
Bilirubin Urine: NEGATIVE
Glucose, UA: NEGATIVE mg/dL
Hgb urine dipstick: NEGATIVE
Ketones, ur: NEGATIVE mg/dL
Leukocytes,Ua: NEGATIVE
Nitrite: NEGATIVE
Protein, ur: NEGATIVE mg/dL
Specific Gravity, Urine: 1.013 (ref 1.005–1.030)
pH: 5 (ref 5.0–8.0)

## 2021-09-08 LAB — TROPONIN I (HIGH SENSITIVITY)
Troponin I (High Sensitivity): 16 ng/L (ref <18)
Troponin I (High Sensitivity): 17 ng/L (ref <18)

## 2021-09-08 LAB — PROCALCITONIN: Procalcitonin: <0.1 ng/mL

## 2021-09-08 LAB — LACTIC ACID, PLASMA
Lactic Acid, Venous: 3.1 mmol/L (ref 0.5–1.9)
Lactic Acid, Venous: 3.8 mmol/L (ref 0.5–1.9)
Lactic Acid, Venous: 3.2 mmol/L (ref 0.5–1.9)

## 2021-09-08 MED ORDER — QUETIAPINE FUMARATE 25 MG PO TABS
25.0000 mg | ORAL_TABLET | Freq: Every day | ORAL | Status: DC
  Administered 2021-09-08 – 2021-09-11 (×4): 25 mg via ORAL
  Filled 2021-09-08 (×4): qty 1

## 2021-09-08 MED ORDER — ALBUTEROL SULFATE (2.5 MG/3ML) 0.083% IN NEBU
5.0000 mg | INHALATION_SOLUTION | Freq: Once | RESPIRATORY_TRACT | Status: AC
  Administered 2021-09-08: 5 mg via RESPIRATORY_TRACT
  Filled 2021-09-08: qty 6

## 2021-09-08 MED ORDER — ESCITALOPRAM OXALATE 10 MG PO TABS
10.0000 mg | ORAL_TABLET | Freq: Every day | ORAL | Status: DC
  Administered 2021-09-09 – 2021-09-12 (×4): 10 mg via ORAL
  Filled 2021-09-08 (×4): qty 1

## 2021-09-08 MED ORDER — PRAVASTATIN SODIUM 20 MG PO TABS
20.0000 mg | ORAL_TABLET | Freq: Every day | ORAL | Status: DC
  Administered 2021-09-08 – 2021-09-11 (×4): 20 mg via ORAL
  Filled 2021-09-08 (×4): qty 1

## 2021-09-08 MED ORDER — SUCRALFATE 1 G PO TABS
1.0000 g | ORAL_TABLET | Freq: Two times a day (BID) | ORAL | Status: DC
  Administered 2021-09-08 – 2021-09-12 (×8): 1 g via ORAL
  Filled 2021-09-08 (×8): qty 1

## 2021-09-08 MED ORDER — LEFLUNOMIDE 20 MG PO TABS
20.0000 mg | ORAL_TABLET | Freq: Every day | ORAL | Status: DC
  Administered 2021-09-09 – 2021-09-12 (×4): 20 mg via ORAL
  Filled 2021-09-08 (×4): qty 1

## 2021-09-08 MED ORDER — PIPERACILLIN-TAZOBACTAM 3.375 G IVPB 30 MIN
3.3750 g | Freq: Once | INTRAVENOUS | Status: AC
  Administered 2021-09-08: 3.375 g via INTRAVENOUS
  Filled 2021-09-08: qty 50

## 2021-09-08 MED ORDER — HYDROCODONE-ACETAMINOPHEN 5-325 MG PO TABS
1.0000 | ORAL_TABLET | Freq: Once | ORAL | Status: AC
  Administered 2021-09-08: 1 via ORAL
  Filled 2021-09-08: qty 1

## 2021-09-08 MED ORDER — BUDESONIDE 0.25 MG/2ML IN SUSP
0.2500 mg | Freq: Two times a day (BID) | RESPIRATORY_TRACT | Status: DC
  Administered 2021-09-08 – 2021-09-12 (×8): 0.25 mg via RESPIRATORY_TRACT
  Filled 2021-09-08 (×8): qty 2

## 2021-09-08 MED ORDER — BUSPIRONE HCL 10 MG PO TABS
5.0000 mg | ORAL_TABLET | Freq: Two times a day (BID) | ORAL | Status: DC
  Administered 2021-09-08 – 2021-09-12 (×8): 5 mg via ORAL
  Filled 2021-09-08 (×8): qty 1

## 2021-09-08 MED ORDER — MIRABEGRON ER 50 MG PO TB24
50.0000 mg | ORAL_TABLET | Freq: Every day | ORAL | Status: DC
  Administered 2021-09-08 – 2021-09-12 (×5): 50 mg via ORAL
  Filled 2021-09-08 (×6): qty 1

## 2021-09-08 MED ORDER — ONDANSETRON HCL 4 MG/2ML IJ SOLN
4.0000 mg | Freq: Four times a day (QID) | INTRAMUSCULAR | Status: DC | PRN
  Filled 2021-09-08: qty 2

## 2021-09-08 MED ORDER — NEBIVOLOL HCL 5 MG PO TABS
5.0000 mg | ORAL_TABLET | Freq: Every day | ORAL | Status: DC
  Administered 2021-09-09 – 2021-09-12 (×4): 5 mg via ORAL
  Filled 2021-09-08 (×4): qty 1

## 2021-09-08 MED ORDER — MONTELUKAST SODIUM 10 MG PO TABS
10.0000 mg | ORAL_TABLET | Freq: Every day | ORAL | Status: DC
Start: 2021-09-08 — End: 2021-09-09
  Administered 2021-09-08: 10 mg via ORAL
  Filled 2021-09-08: qty 1

## 2021-09-08 MED ORDER — PANTOPRAZOLE SODIUM 20 MG PO TBEC
20.0000 mg | DELAYED_RELEASE_TABLET | Freq: Two times a day (BID) | ORAL | Status: DC
Start: 2021-09-08 — End: 2021-09-12
  Administered 2021-09-08 – 2021-09-12 (×9): 20 mg via ORAL
  Filled 2021-09-08 (×10): qty 1

## 2021-09-08 MED ORDER — APIXABAN 5 MG PO TABS
5.0000 mg | ORAL_TABLET | Freq: Two times a day (BID) | ORAL | Status: DC
  Administered 2021-09-08 – 2021-09-12 (×8): 5 mg via ORAL
  Filled 2021-09-08 (×8): qty 1

## 2021-09-08 MED ORDER — AMLODIPINE BESYLATE 5 MG PO TABS
5.0000 mg | ORAL_TABLET | Freq: Every day | ORAL | Status: DC
  Administered 2021-09-08 – 2021-09-12 (×5): 5 mg via ORAL
  Filled 2021-09-08 (×5): qty 1

## 2021-09-08 MED ORDER — ONDANSETRON HCL 4 MG PO TABS: 4.0000 mg | ORAL_TABLET | Freq: Four times a day (QID) | ORAL | Status: DC | PRN

## 2021-09-08 MED ORDER — ACETAMINOPHEN 650 MG RE SUPP: 650.0000 mg | Freq: Four times a day (QID) | RECTAL | Status: DC | PRN

## 2021-09-08 MED ORDER — METHOCARBAMOL 500 MG PO TABS
500.0000 mg | ORAL_TABLET | Freq: Four times a day (QID) | ORAL | Status: DC | PRN
Start: 2021-09-08 — End: 2021-09-12
  Administered 2021-09-08 – 2021-09-12 (×8): 500 mg via ORAL
  Filled 2021-09-08 (×8): qty 1

## 2021-09-08 MED ORDER — ACETAMINOPHEN 325 MG PO TABS
650.0000 mg | ORAL_TABLET | Freq: Four times a day (QID) | ORAL | Status: DC | PRN
  Administered 2021-09-08 – 2021-09-12 (×8): 650 mg via ORAL
  Filled 2021-09-08 (×8): qty 2

## 2021-09-08 MED ORDER — IPRATROPIUM-ALBUTEROL 0.5-2.5 (3) MG/3ML IN SOLN
3.0000 mL | Freq: Once | RESPIRATORY_TRACT | Status: AC
  Administered 2021-09-08: 3 mL via RESPIRATORY_TRACT
  Filled 2021-09-08: qty 3

## 2021-09-08 MED ORDER — GABAPENTIN 300 MG PO CAPS
600.0000 mg | ORAL_CAPSULE | Freq: Two times a day (BID) | ORAL | Status: DC
  Administered 2021-09-08 – 2021-09-12 (×8): 600 mg via ORAL
  Filled 2021-09-08 (×8): qty 2

## 2021-09-08 MED ORDER — SODIUM CHLORIDE 0.9 % IV SOLN
3.0000 g | Freq: Two times a day (BID) | INTRAVENOUS | Status: DC
  Administered 2021-09-08 – 2021-09-09 (×3): 3 g via INTRAVENOUS
  Filled 2021-09-08 (×3): qty 8

## 2021-09-08 MED ORDER — DICYCLOMINE HCL 10 MG PO CAPS
20.0000 mg | ORAL_CAPSULE | Freq: Two times a day (BID) | ORAL | Status: DC
  Administered 2021-09-08 – 2021-09-12 (×8): 20 mg via ORAL
  Filled 2021-09-08 (×8): qty 2

## 2021-09-08 MED ORDER — IPRATROPIUM BROMIDE 0.02 % IN SOLN
0.5000 mg | Freq: Once | RESPIRATORY_TRACT | Status: AC
  Administered 2021-09-08: 0.5 mg via RESPIRATORY_TRACT
  Filled 2021-09-08: qty 2.5

## 2021-09-08 MED ORDER — OCUVITE-LUTEIN PO CAPS
1.0000 | ORAL_CAPSULE | Freq: Every day | ORAL | Status: DC
  Administered 2021-09-08 – 2021-09-12 (×5): 1 via ORAL
  Filled 2021-09-08 (×6): qty 1

## 2021-09-08 MED ORDER — FUROSEMIDE 10 MG/ML IJ SOLN
40.0000 mg | Freq: Every day | INTRAMUSCULAR | Status: DC
Start: 2021-09-08 — End: 2021-09-10
  Administered 2021-09-08 – 2021-09-10 (×3): 40 mg via INTRAVENOUS
  Filled 2021-09-08 (×4): qty 4

## 2021-09-08 MED ORDER — IPRATROPIUM-ALBUTEROL 0.5-2.5 (3) MG/3ML IN SOLN
3.0000 mL | Freq: Four times a day (QID) | RESPIRATORY_TRACT | Status: DC | PRN
  Administered 2021-09-08 – 2021-09-09 (×3): 3 mL via RESPIRATORY_TRACT
  Filled 2021-09-08 (×4): qty 3

## 2021-09-08 MED ORDER — LAMOTRIGINE 100 MG PO TABS
100.0000 mg | ORAL_TABLET | Freq: Two times a day (BID) | ORAL | Status: DC
  Administered 2021-09-08 – 2021-09-12 (×8): 100 mg via ORAL
  Filled 2021-09-08 (×8): qty 1

## 2021-09-08 NOTE — Patient Outreach (Signed)
  Care Coordination   09/08/2021 Name: Kerri Carter MRN: [id number] DOB: 03-15-1940   Care Coordination Outreach Attempts:  Contact was made with the patient today to offer care coordination services as a benefit of their health plan. The patient requested a return call on a later date. HIPAA verified. Patient states this is not a good time because she is at the hospital   Follow Up Plan:  Additional outreach attempts will be made to offer the patient care coordination information and services.   Encounter Outcome:  Pt. Request to Call Back  Care Coordination Interventions Activated:  No   Care Coordination Interventions:  No, not indicated    Quinn Plowman RN,BSN,CCM RN Care Manager Coordinator [phone]

## 2021-09-08 NOTE — H&P (Addendum)
History and Physical    Patient: Kerri Carter XHB:[id number] DOB: 01-02-1941 DOA: 09/08/2021 DOS: the patient was seen and examined on 09/08/2021 PCP: McLean-Scocuzza, Nino Glow, MD  Patient coming from: Home  Chief Complaint:  Chief Complaint  Patient presents with   Respiratory Distress   HPI: Kerri Carter is a 81 y.o. female with medical history significant for COPD with chronic respiratory failure on 3 L of oxygen, history of chronic diastolic dysfunction CHF, stage IIIb chronic kidney disease, depression, hypertension, GERD, interstitial lung disease, peripheral arterial disease, bipolar disorder who presents to the ER via EMS for evaluation of worsening shortness of breath. Patient was recently hospitalized from 07/19 - 07/21 and was discharged home on a steroid taper.  Prior to the hospitalization she was admitted from 06/20 - 06/22 for community-acquired pneumonia. She states that symptoms started 2 days prior to her presentation and has steadily worsened since then.  Shortness of breath is associated with a nonproductive and wheezing.  She denies having any fever or chills and denies having any sick contacts. Per EMS she had pulse oximetry of 84% on her 3 L of oxygen and was placed on a CPAP prior to transport to the ER. She received DuoNeb, albuterol and 125 mg of IV Solu-Medrol. She denies having any chest pain, no nausea, no vomiting, no abdominal pain, no changes in her bowel habits, no urinary symptoms, no dizziness, no lightheadedness, no leg swelling, no headache, no blurred vision no focal deficit.   Review of Systems: As mentioned in the history of present illness. All other systems reviewed and are negative. Past Medical History:  Diagnosis Date   Anxiety    Aortic stenosis 07/09/2015   a.) TTE 07/06/2015: EF 55-60%; mild AS with MPG 13.0 mmHg.   Arrhythmia    atrial fibrillation   Asthma    Bipolar disorder ([place])    C. difficile diarrhea 2010   a.) following ABX  course during hospital admission   Carotid atherosclerosis, bilateral    a.) Moderate; < 50% stenosis BILATERAL ICAs.   Cataract    a.) s/p BILATERAL extraction   CHF (congestive heart failure) (HCC)    Chicken pox    CKD (chronic kidney disease), stage III (HCC)    a. s/p R nephrectomy./ aneurysm   Conversion disorder    COPD (chronic obstructive pulmonary disease) (HCC)    Depression    Essential hypertension    GERD (gastroesophageal reflux disease)    Heart murmur    Hyperlipidemia    ILD (interstitial lung disease) (HCC)    mild; 2/2 RA diagnosis   Inflammatory arthritis    a. hands/carpal tunnel.  b. Low titer rheumatoid factor. c. Negative anti-CCP antibodies. d. Plaquenil.   Macular degeneration    Nocturnal hypoxemia    Non-Obstructive CAD    a. 07/2009 Cath ([facility]): nonobs dzs;  b. 03/2011 Cath [facility]): nonobs dzs.   Osteoarthritis    a. Knees.   PAD (peripheral artery disease) (HCC)    PUD (peptic ulcer disease)    S/P right hip fracture    11/01/16 s/p repair   Shoulder pain    Sleep apnea    no cpap / minimal   Spinal stenosis at L4-L5 level    severe with L4/L5 anterolisthesis grade 1 anterolisthesis    Toxic maculopathy    Valvular heart disease    a.) TTE 07/06/2015: EF 55-60%; Mild MR/AR/TR; Mild AS with MPG 13.0 mmHg.   Past Surgical History:  Procedure  Laterality Date   APPENDECTOMY     APPLICATION OF INTRAOPERATIVE CT SCAN N/A 05/23/2021   Procedure: APPLICATION OF INTRAOPERATIVE CT SCAN;  Surgeon: Meade Maw, MD;  Location: [facility];  Service: Neurosurgery;  Laterality: N/A;   BACK SURGERY     lumbar fusion   BUNIONECTOMY Right    CATARACT EXTRACTION, BILATERAL     CESAREAN SECTION     x1   CHOLECYSTECTOMY N/A 05/11/2016   Procedure: LAPAROSCOPIC CHOLECYSTECTOMY;  Surgeon: Florene Glen, MD;  Location: [facility];  Service: General;  Laterality: N/A;   COLONOSCOPY WITH PROPOFOL N/A 04/02/2016   Procedure: COLONOSCOPY WITH PROPOFOL;  Surgeon:  Jonathon Bellows, MD;  Location: [facility];  Service: Endoscopy;  Laterality: N/A;   ENDOSCOPIC RETROGRADE CHOLANGIOPANCREATOGRAPHY (ERCP) WITH PROPOFOL N/A 05/08/2016   Procedure: ENDOSCOPIC RETROGRADE CHOLANGIOPANCREATOGRAPHY (ERCP) WITH PROPOFOL;  Surgeon: Lucilla Lame, MD;  Location: [facility];  Service: Endoscopy;  Laterality: N/A;   ERCP     with biliary spincterotomy 05/08/16 Dr. Allen Norris for choledocholithiasis    ESOPHAGEAL DILATION  04/02/2016   Procedure: ESOPHAGEAL DILATION;  Surgeon: Jonathon Bellows, MD;  Location: [facility];  Service: Endoscopy;;   ESOPHAGOGASTRODUODENOSCOPY (EGD) WITH PROPOFOL N/A 04/02/2016   Procedure: ESOPHAGOGASTRODUODENOSCOPY (EGD) WITH PROPOFOL;  Surgeon: Jonathon Bellows, MD;  Location: [facility];  Service: Endoscopy;  Laterality: N/A;   HIP ARTHROPLASTY Right 11/01/2016   Procedure: ARTHROPLASTY BIPOLAR HIP (HEMIARTHROPLASTY);  Surgeon: Corky Mull, MD;  Location: [facility];  Service: Orthopedics;  Laterality: Right;   LUMBAR LAMINECTOMY/DECOMPRESSION MICRODISCECTOMY N/A 12/11/2020   Procedure: LEFT L2-3 MICRODISCECTOMY, L3-4 AND L5-S1 DECOMPRESSION;  Surgeon: Meade Maw, MD;  Location: [facility];  Service: Neurosurgery;  Laterality: N/A;   NEPHRECTOMY  1988   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   ORIF SCAPHOID FRACTURE Left 12/21/2019   Procedure: OPEN REDUCTION INTERNAL FIXATION (ORIF) OF LEFT SCAPULAR NONUNION WITH BONE GRAFT;  Surgeon: Corky Mull, MD;  Location: [facility];  Service: Orthopedics;  Laterality: Left;   osteoporosis     noted DEXA 08/19/16    REPLACEMENT TOTAL KNEE Right    REVERSE SHOULDER ARTHROPLASTY Right 11/04/2017   Procedure: REVERSE SHOULDER ARTHROPLASTY;  Surgeon: Corky Mull, MD;  Location: [facility];  Service: Orthopedics;  Laterality: Right;   REVERSE SHOULDER ARTHROPLASTY Left 07/26/2018   Procedure: REVERSE SHOULDER ARTHROPLASTY;  Surgeon: Corky Mull, MD;  Location: [facility];  Service:  Orthopedics;  Laterality: Left;   TONSILLECTOMY     TOTAL HIP ARTHROPLASTY  12/10/11   [facility] left hip   TOTAL HIP ARTHROPLASTY Bilateral    TRANSFORAMINAL LUMBAR INTERBODY FUSION (TLIF) WITH PEDICLE SCREW FIXATION 3 LEVEL N/A 05/23/2021   Procedure: OPEN L2-4 TRANSFORAMINAL LUMBAR INTERBODY FUSION (TLIF) WITH L2-5 POSTERIOR SPINAL FUSION;  Surgeon: Meade Maw, MD;  Location: [facility];  Service: Neurosurgery;  Laterality: N/A;   TUBAL LIGATION     Social History:  reports that she quit smoking about 47 years ago. Her smoking use included cigarettes. She has a 10.00 pack-year smoking history. She has never used smokeless tobacco. She reports that she does not drink alcohol and does not use drugs.  Allergies  Allergen Reactions   Ceftin [Cefuroxime Axetil] Anaphylaxis   Lisinopril Anaphylaxis   Sulfa Antibiotics Other (See Comments)    Face swelling   Sulfasalazine Anaphylaxis   Morphine Other (See Comments)    Per patient, low blood pressure issues that requires action to raise it back up. Can take small infrequent doses  Xarelto [Rivaroxaban] Other (See Comments)    Stomach burning, bleeding, and tar in stool   Adhesive [Tape] Rash    Paper tape and tega derm OK   Antihistamines, Chlorpheniramine-Type Other (See Comments)    Makes pt hyper   Antivert [Meclizine Hcl] Other (See Comments)    Bladder will not empty   Aspirin Other (See Comments)    Sulfasalazine allergy cross reacts   Contrast Media [Iodinated Contrast Media] Rash    she is able to use betadine scrubs.   Decongestant [Pseudoephedrine Hcl] Other (See Comments)    Makes pt hyper   Doxycycline Other (See Comments)    GI upset   Levaquin [Levofloxacin In D5w] Rash   Polymyxin B Other (See Comments)    Facial rash   Tetanus Toxoids Rash and Other (See Comments)    Fever and hot to touch at injection site    Family History  Problem Relation Age of Onset   Rheum arthritis Mother    Asthma Mother     Parkinson's disease Mother    Heart disease Mother    Stroke Mother    Hypertension Mother    Heart attack Father    Heart disease Father    Hypertension Father    Peripheral Artery Disease Father    Diabetes Son    Gout Son    Asthma Sister    Heart disease Sister    Lung cancer Sister    Heart disease Sister    Heart disease Sister    Breast cancer Sister    Heart attack Sister    Heart disease Brother    Heart disease Maternal Grandmother    Diabetes Maternal Grandmother    Colon cancer Maternal Grandmother    Cancer Maternal Grandmother        Hodgkins lymphoma   Heart disease Brother    Alcohol abuse Brother    Depression Brother    Dementia Son     Prior to Admission medications   Medication Sig Start Date End Date Taking? Authorizing Provider  albuterol (VENTOLIN HFA) 108 (90 Base) MCG/ACT inhaler Inhale 2 puffs into the lungs every 6 (six) hours as needed for wheezing or shortness of breath. 06/24/21   McLean-Scocuzza, Nino Glow, MD  amLODipine (NORVASC) 5 MG tablet Take 1 tablet (5 mg total) by mouth daily. 08/01/21   McLean-Scocuzza, Nino Glow, MD  apixaban (ELIQUIS) 5 MG TABS tablet Take 1 tablet (5 mg total) by mouth 2 (two) times daily. 07/31/21 09/29/21  Ralene Muskrat B, MD  budesonide (PULMICORT) 0.25 MG/2ML nebulizer solution USE 1 VIAL  IN  NEBULIZER TWICE  DAILY - rinse mouth after treatment 08/13/21   [name], MD  busPIRone (BUSPAR) 5 MG tablet Take 1 tablet (5 mg total) by mouth 2 (two) times daily. 06/02/21   Jennye Boroughs, MD  Cholecalciferol (VITAMIN D3) 50 MCG (2000 UT) TABS Take 4,000 Units by mouth daily. 08/01/21   McLean-Scocuzza, Nino Glow, MD  dicyclomine (BENTYL) 10 MG capsule Take 1 capsule (10 mg total) by mouth 2 (two) times daily as needed for spasms. Patient taking differently: Take 20 mg by mouth 2 (two) times daily. 10/01/20 09/26/21  Jonathon Bellows, MD  escitalopram (LEXAPRO) 10 MG tablet Take 1 tablet (10 mg total) by mouth daily. 08/01/21    McLean-Scocuzza, Nino Glow, MD  famotidine (PEPCID) 20 MG tablet TAKE 1 TABLET (20 MG TOTAL) BY MOUTH DAILY. BEFORE BREAKFAST OR DINNER 03/24/21   McLean-Scocuzza, Nino Glow, MD  fluticasone (  FLONASE) 50 MCG/ACT nasal spray Place 2 sprays into both nostrils daily. In am 12/08/19   McLean-Scocuzza, Nino Glow, MD  gabapentin (NEURONTIN) 300 MG capsule Take 2 capsules (600 mg total) by mouth in the morning and at bedtime. 09/03/20   McLean-Scocuzza, Nino Glow, MD  ipratropium-albuterol (DUONEB) 0.5-2.5 (3) MG/3ML SOLN Take 3 mLs by nebulization 3 (three) times daily as needed. 08/13/21   [name], MD  lamoTRIgine (LAMICTAL) 100 MG tablet TAKE 1 TAB 2 TIMES DAILY. FURTHER REFILLS NEW PSYCH FOR ALL PSYCH MEDS ONLY TEMP SUPPLY FROM PCP 05/02/21   McLean-Scocuzza, Nino Glow, MD  leflunomide (ARAVA) 20 MG tablet Take 1 tablet (20 mg total) by mouth daily. 09/30/17   McLean-Scocuzza, Nino Glow, MD  lidocaine (LIDODERM) 5 % Place 1 patch onto the skin 2 (two) times daily as needed. Remove & Discard patch within 12 hours or as directed by MD Patient taking differently: Place 1 patch onto the skin daily. Remove & Discard patch within 12 hours or as directed by MD 11/22/20   McLean-Scocuzza, Nino Glow, MD  lovastatin (MEVACOR) 20 MG tablet TAKE 1 TABLET BY MOUTH EVERYDAY AT BEDTIME *STOP TALKING '40MG'$ * 08/01/21   McLean-Scocuzza, Nino Glow, MD  methocarbamol (ROBAXIN) 500 MG tablet Take 500 mg by mouth 4 (four) times daily as needed. Taking twice a day currently 01/23/21   [provider]  mirabegron ER (MYRBETRIQ) 50 MG TB24 tablet Take 1 tablet (50 mg total) by mouth daily. 12/04/20 11/29/21  Vaillancourt, Aldona Bar, PA-C  montelukast (SINGULAIR) 10 MG tablet TAKE 1 TABLET BY MOUTH EVERY DAY 03/24/21   McLean-Scocuzza, Nino Glow, MD  multivitamin-lutein [facility]) CAPS capsule Take 1 capsule by mouth at bedtime.    [provider]  nebivolol (BYSTOLIC) 5 MG tablet Take 1 tablet (5 mg total) by mouth in the  morning and at bedtime. (Note dose changed from 1/2 10 mg bid to 5 mg bid) 08/01/21   McLean-Scocuzza, Nino Glow, MD  oxyCODONE-acetaminophen (PERCOCET) 5-325 MG tablet Take 1 tablet by mouth every 4 (four) hours as needed for severe pain. Patient not taking: Reported on 09/02/2021 07/17/21   Harvest Dark, MD  OXYGEN Inhale 2 L into the lungs at bedtime.    [provider]  pantoprazole (PROTONIX) 40 MG tablet TAKE 1 TABLET BY MOUTH 2 TIMES DAILY 30 MIN BEFORE FOOD (NOTE REDUCTION IN FREQUENCY) 07/07/21   McLean-Scocuzza, Nino Glow, MD  QUEtiapine (SEROQUEL) 25 MG tablet TAKE 1 TABLET (25 MG TOTAL) BY MOUTH AT BEDTIME. AGAIN LAST FILL FURTHER REFILLS FROM PSYCHIATRY NO EXCEPTIONS 09/13/20   McLean-Scocuzza, Nino Glow, MD  sucralfate (CARAFATE) 1 g tablet Take 1 tablet (1 g total) by mouth 2 (two) times daily. 09/17/20 09/12/21  Jonathon Bellows, MD  torsemide (DEMADEX) 20 MG tablet Take 1 tablet (20 mg total) by mouth daily. 08/01/21   Ralene Muskrat B, MD  zoledronic acid (RECLAST) 5 MG/100ML SOLN injection Inject 5 mg into the vein once.    [provider]    Physical Exam: Vitals:   09/08/21 0609 09/08/21 0630 09/08/21 0650 09/08/21 0729  BP:   (!) 119/58 (!) 134/51  Pulse:  89 84 91  Resp:  17  (!) 24  Temp: 98.4 F (36.9 C)     TempSrc: Oral     SpO2:  94% 95% 95%  Weight:      Height:       Physical Exam Vitals and nursing note reviewed.  Constitutional:  Appearance: Normal appearance.  HENT:     Head: Normocephalic and atraumatic.     Nose: Nose normal.     Mouth/Throat:     Mouth: Mucous membranes are moist.  Eyes:     Pupils: Pupils are equal, round, and reactive to light.  Cardiovascular:     Rate and Rhythm: Normal rate and regular rhythm.  Pulmonary:     Effort: Pulmonary effort is normal.     Breath sounds: Normal breath sounds.  Abdominal:     General: Abdomen is flat. Bowel sounds are normal.     Palpations: Abdomen is soft.  Musculoskeletal:      Cervical back: Normal range of motion.  Neurological:     Mental Status: She is alert.     Data Reviewed: Relevant notes from primary care and specialist visits, past discharge summaries as available in EHR, including Care Everywhere. Prior diagnostic testing as pertinent to current admission diagnoses Updated medications and problem lists for reconciliation ED course, including vitals, labs, imaging, treatment and response to treatment Triage notes, nursing and pharmacy notes and ED provider's notes Notable results as noted in HPI Labs reviewed.  Troponin 16 >> 17, lactic acid 3.1, BNP 345, sodium 139, potassium 4.3, chloride 106, bicarb 24, glucose 159, BUN 19, creatinine 1.34, calcium 8.4, total protein 6.7, albumin 3.3, AST 24, ALT 16, alkaline phosphatase 98, white count 18.1, hemoglobin 8.8, hematocrit 30.8, platelet count 265 ABG 7.43/40/67/26.5/94.8 Chest x-ray reviewed by me shows markedly rotated film with bibasilar collapse/consolidation and small bilateral pleural effusions. Right hip x-ray no acute osseous abnormality Right foot x-ray shows acute minimally impacted fracture at the base of the fifth proximal phalanx. Twelve-lead EKG reviewed by me shows sinus rhythm.  Short PR interval. There are no new results to review at this time.  Assessment and Plan: * Acute on chronic respiratory failure (HCC) Secondary to acute COPD and CHF exacerbation. At baseline patient wears 3 L of oxygen but on at 3 L she had pulse oximetry of 84% and was placed on a CPAP by EMS Patient has been weaned off CPAP and is currently on nasal cannula Continue oxygen supplementation to maintain pulse oximetry greater than 92%  Acute on chronic diastolic CHF (congestive heart failure) ([place]) Patient presents for evaluation of worsening shortness of breath from her baseline and was hypoxic upon presentation. BNP is elevated and chest x-ray shows bilateral pleural effusions. Last known LVEF is 55 to 60%  from a 2D cardiogram which was done 06/23 Place patient on Lasix 40 mg IV daily Continue Bystolic and amlodipine to optimize blood pressure control Maintain low-sodium diet  Recurrent pneumonia Patient presents for evaluation of worsening shortness of breath, nonproductive cough and hypoxia as well as marked leukocytosis and lactic acidosis. CT scan of the chest without contrast shows  interval development of patchy ground-glass airspace disease in the right upper lobe and posterior left upper lobe, likely infectious/inflammatory.Interval development of consolidative airspace disease in both lower lobes which may be in part related to atelectasis although consolidative pneumonia, notably in the right lower lobe, is suspected. Concern for possible aspiration pneumonia Will place patient on Unasyn adjusted to renal function Speech therapy consult  COPD exacerbation ([place]) Place patient on scheduled and as needed bronchodilator therapy Continue inhaled steroids Continue oxygen supplementation at 3 L to maintain pulse oximetry greater than 92%  Paroxysmal atrial fibrillation (HCC) Stable Continue Eliquis as primary prophylaxis for an acute stroke Continue Bystolic for rate control  Physical deconditioning Secondary  to frequent hospitalizations as well as chronic illness Has frequent falls at home and gets home PT Status post recent fall with complaints of pain in her right hip and foot. Right foot x-ray shows acute minimally impacted fracture at the base of the fifth proximal phalanx. Podiatry consult to recommend weightbearing as tolerated in surgical shoe Place patient on fall precautions PT evaluation  Rheumatoid arthritis ([place]) Stable Continue Arava  Mixed bipolar I disorder (HCC) Stable Continue BuSpar, Lexapro and Seroquel  Stage 3b chronic kidney disease ([place]) Renal function appears stable Monitor closely while on diuretic therapy Follow-up with nephrology as an  outpatient  Gastroesophageal reflux disease Stable Continue PPI      Advance Care Planning:   Code Status: DNR   Consults: Podiatry, physical therapy  Family Communication: Patient 50% of time was spent discussing patient's condition and plan of care with her at the bedside.  All questions and concerns have been addressed.  She verbalizes understanding and agrees with the plan.  CODE STATUS was discussed and she wishes to be DNR.  Severity of Illness: The appropriate patient status for this patient is INPATIENT. Inpatient status is judged to be reasonable and necessary in order to provide the required intensity of service to ensure the patient's safety. The patient's presenting symptoms, physical exam findings, and initial radiographic and laboratory data in the context of their chronic comorbidities is felt to place them at high risk for further clinical deterioration. Furthermore, it is not anticipated that the patient will be medically stable for discharge from the hospital within 2 midnights of admission.   * I certify that at the point of admission it is my clinical judgment that the patient will require inpatient hospital care spanning beyond 2 midnights from the point of admission due to high intensity of service, high risk for further deterioration and high frequency of surveillance required.*  Author: Collier Bullock, MD 09/08/2021 10:20 AM  For on call review [email or web address].

## 2021-09-08 NOTE — Assessment & Plan Note (Signed)
Procal elevated.  CT chest shows new patchy infiltrates in right upper lobe and left upper lobe as well as right lower lobe - Continue Unasyn - Follow sputum culture - Continue Mucinex, pulmonary toilet

## 2021-09-08 NOTE — Assessment & Plan Note (Signed)
Secondary to acute COPD and CHF exacerbation. At baseline patient wears 3 L of oxygen but on at 3 L she had pulse oximetry of 84% and was placed on a CPAP by EMS Patient has been weaned off CPAP and is currently on nasal cannula Continue oxygen supplementation to maintain pulse oximetry greater than 92%

## 2021-09-08 NOTE — ED Triage Notes (Addendum)
Pt arrives resp distress, emergency traffic, ems has cpap in place. Ra pox 84% per ems, ems gave 1 duoneb, 2 albuterol, solumedrol '125mg'$  iv. 20g lac. Pt taken off cpap, is able to speak in full sentences.

## 2021-09-08 NOTE — ED Provider Notes (Addendum)
[facility] Provider Note    Event Date/Time   First MD Initiated Contact with Patient 09/08/21 [phone]     (approximate)   History   Respiratory Distress   HPI  Kerri Carter is a 81 y.o. female with history of asthma, COPD on 3 L chronically, atrial fibrillation on Eliquis, aortic stenosis, CHF, HTN, HLD, CKD who presents to the emergency department with shortness of breath that started today.  EMS reports sats were 84% on her normal 3 L.  She received 1 DuoNeb, 2 albuterols and 125 mg of IV Solu-Medrol.  Was initially on CPAP but is improving.  No chest pain, fever or productive cough.  No lower extremity swelling or pain.  Patient was just admitted to the hospital in July for COPD exacerbation has been admitted several times before for community-acquired treatment.  She finished a steroid burst on July 26.   History provided by patient.    Past Medical History:  Diagnosis Date   Anxiety    Aortic stenosis 07/09/2015   a.) TTE 07/06/2015: EF 55-60%; mild AS with MPG 13.0 mmHg.   Arrhythmia    atrial fibrillation   Asthma    Bipolar disorder ([place])    C. difficile diarrhea 2010   a.) following ABX course during hospital admission   Carotid atherosclerosis, bilateral    a.) Moderate; < 50% stenosis BILATERAL ICAs.   Cataract    a.) s/p BILATERAL extraction   CHF (congestive heart failure) (HCC)    Chicken pox    CKD (chronic kidney disease), stage III (HCC)    a. s/p R nephrectomy./ aneurysm   Conversion disorder    COPD (chronic obstructive pulmonary disease) (HCC)    Depression    Essential hypertension    GERD (gastroesophageal reflux disease)    Heart murmur    Hyperlipidemia    ILD (interstitial lung disease) (HCC)    mild; 2/2 RA diagnosis   Inflammatory arthritis    a. hands/carpal tunnel.  b. Low titer rheumatoid factor. c. Negative anti-CCP antibodies. d. Plaquenil.   Macular degeneration    Nocturnal hypoxemia    Non-Obstructive  CAD    a. 07/2009 Cath ([facility]): nonobs dzs;  b. 03/2011 Cath [facility]): nonobs dzs.   Osteoarthritis    a. Knees.   PAD (peripheral artery disease) (HCC)    PUD (peptic ulcer disease)    S/P right hip fracture    11/01/16 s/p repair   Shoulder pain    Sleep apnea    no cpap / minimal   Spinal stenosis at L4-L5 level    severe with L4/L5 anterolisthesis grade 1 anterolisthesis    Toxic maculopathy    Valvular heart disease    a.) TTE 07/06/2015: EF 55-60%; Mild MR/AR/TR; Mild AS with MPG 13.0 mmHg.    Past Surgical History:  Procedure Laterality Date   APPENDECTOMY     APPLICATION OF INTRAOPERATIVE CT SCAN N/A 05/23/2021   Procedure: APPLICATION OF INTRAOPERATIVE CT SCAN;  Surgeon: Meade Maw, MD;  Location: [facility] ORS;  Service: Neurosurgery;  Laterality: N/A;   BACK SURGERY     lumbar fusion   BUNIONECTOMY Right    CATARACT EXTRACTION, BILATERAL     CESAREAN SECTION     x1   CHOLECYSTECTOMY N/A 05/11/2016   Procedure: LAPAROSCOPIC CHOLECYSTECTOMY;  Surgeon: Florene Glen, MD;  Location: [facility] ORS;  Service: General;  Laterality: N/A;   COLONOSCOPY WITH PROPOFOL N/A 04/02/2016   Procedure: COLONOSCOPY WITH PROPOFOL;  Surgeon: Jonathon Bellows, MD;  Location: [facility] ENDOSCOPY;  Service: Endoscopy;  Laterality: N/A;   ENDOSCOPIC RETROGRADE CHOLANGIOPANCREATOGRAPHY (ERCP) WITH PROPOFOL N/A 05/08/2016   Procedure: ENDOSCOPIC RETROGRADE CHOLANGIOPANCREATOGRAPHY (ERCP) WITH PROPOFOL;  Surgeon: Lucilla Lame, MD;  Location: [facility] ENDOSCOPY;  Service: Endoscopy;  Laterality: N/A;   ERCP     with biliary spincterotomy 05/08/16 Dr. Allen Norris for choledocholithiasis    ESOPHAGEAL DILATION  04/02/2016   Procedure: ESOPHAGEAL DILATION;  Surgeon: Jonathon Bellows, MD;  Location: [facility] ENDOSCOPY;  Service: Endoscopy;;   ESOPHAGOGASTRODUODENOSCOPY (EGD) WITH PROPOFOL N/A 04/02/2016   Procedure: ESOPHAGOGASTRODUODENOSCOPY (EGD) WITH PROPOFOL;  Surgeon: Jonathon Bellows, MD;  Location: [facility] ENDOSCOPY;  Service: Endoscopy;  Laterality:  N/A;   HIP ARTHROPLASTY Right 11/01/2016   Procedure: ARTHROPLASTY BIPOLAR HIP (HEMIARTHROPLASTY);  Surgeon: Corky Mull, MD;  Location: [facility] ORS;  Service: Orthopedics;  Laterality: Right;   LUMBAR LAMINECTOMY/DECOMPRESSION MICRODISCECTOMY N/A 12/11/2020   Procedure: LEFT L2-3 MICRODISCECTOMY, L3-4 AND L5-S1 DECOMPRESSION;  Surgeon: Meade Maw, MD;  Location: [facility] ORS;  Service: Neurosurgery;  Laterality: N/A;   NEPHRECTOMY  1988   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   ORIF SCAPHOID FRACTURE Left 12/21/2019   Procedure: OPEN REDUCTION INTERNAL FIXATION (ORIF) OF LEFT SCAPULAR NONUNION WITH BONE GRAFT;  Surgeon: Corky Mull, MD;  Location: [facility] ORS;  Service: Orthopedics;  Laterality: Left;   osteoporosis     noted DEXA 08/19/16    REPLACEMENT TOTAL KNEE Right    REVERSE SHOULDER ARTHROPLASTY Right 11/04/2017   Procedure: REVERSE SHOULDER ARTHROPLASTY;  Surgeon: Corky Mull, MD;  Location: [facility] ORS;  Service: Orthopedics;  Laterality: Right;   REVERSE SHOULDER ARTHROPLASTY Left 07/26/2018   Procedure: REVERSE SHOULDER ARTHROPLASTY;  Surgeon: Corky Mull, MD;  Location: [facility] ORS;  Service: Orthopedics;  Laterality: Left;   TONSILLECTOMY     TOTAL HIP ARTHROPLASTY  12/10/11   [facility] left hip   TOTAL HIP ARTHROPLASTY Bilateral    TRANSFORAMINAL LUMBAR INTERBODY FUSION (TLIF) WITH PEDICLE SCREW FIXATION 3 LEVEL N/A 05/23/2021   Procedure: OPEN L2-4 TRANSFORAMINAL LUMBAR INTERBODY FUSION (TLIF) WITH L2-5 POSTERIOR SPINAL FUSION;  Surgeon: Meade Maw, MD;  Location: [facility] ORS;  Service: Neurosurgery;  Laterality: N/A;   TUBAL LIGATION      MEDICATIONS:  Prior to Admission medications   Medication Sig Start Date End Date Taking? Authorizing Provider  albuterol (VENTOLIN HFA) 108 (90 Base) MCG/ACT inhaler Inhale 2 puffs into the lungs every 6 (six) hours as needed for wheezing or shortness of breath. 06/24/21   McLean-Scocuzza, Nino Glow, MD  amLODipine  (NORVASC) 5 MG tablet Take 1 tablet (5 mg total) by mouth daily. 08/01/21   McLean-Scocuzza, Nino Glow, MD  apixaban (ELIQUIS) 5 MG TABS tablet Take 1 tablet (5 mg total) by mouth 2 (two) times daily. 07/31/21 09/29/21  Ralene Muskrat B, MD  budesonide (PULMICORT) 0.25 MG/2ML nebulizer solution USE 1 VIAL  IN  NEBULIZER TWICE  DAILY - rinse mouth after treatment 08/13/21   [name], MD  busPIRone (BUSPAR) 5 MG tablet Take 1 tablet (5 mg total) by mouth 2 (two) times daily. 06/02/21   Jennye Boroughs, MD  Cholecalciferol (VITAMIN D3) 50 MCG (2000 UT) TABS Take 4,000 Units by mouth daily. 08/01/21   McLean-Scocuzza, Nino Glow, MD  dicyclomine (BENTYL) 10 MG capsule Take 1 capsule (10 mg total) by mouth 2 (two) times daily as needed for spasms. Patient taking differently: Take 20 mg by mouth 2 (two) times daily. 10/01/20 09/26/21  Jonathon Bellows, MD  escitalopram (LEXAPRO) 10 MG tablet Take 1 tablet (10 mg total) by mouth daily. 08/01/21   McLean-Scocuzza, Nino Glow, MD  famotidine (PEPCID) 20 MG tablet TAKE 1 TABLET (20 MG TOTAL) BY MOUTH DAILY. BEFORE BREAKFAST OR DINNER 03/24/21   McLean-Scocuzza, Nino Glow, MD  fluticasone (FLONASE) 50 MCG/ACT nasal spray Place 2 sprays into both nostrils daily. In am 12/08/19   McLean-Scocuzza, Nino Glow, MD  gabapentin (NEURONTIN) 300 MG capsule Take 2 capsules (600 mg total) by mouth in the morning and at bedtime. 09/03/20   McLean-Scocuzza, Nino Glow, MD  ipratropium-albuterol (DUONEB) 0.5-2.5 (3) MG/3ML SOLN Take 3 mLs by nebulization 3 (three) times daily as needed. 08/13/21   [name], MD  lamoTRIgine (LAMICTAL) 100 MG tablet TAKE 1 TAB 2 TIMES DAILY. FURTHER REFILLS NEW PSYCH FOR ALL PSYCH MEDS ONLY TEMP SUPPLY FROM PCP 05/02/21   McLean-Scocuzza, Nino Glow, MD  leflunomide (ARAVA) 20 MG tablet Take 1 tablet (20 mg total) by mouth daily. 09/30/17   McLean-Scocuzza, Nino Glow, MD  lidocaine (LIDODERM) 5 % Place 1 patch onto the skin 2 (two) times daily as needed. Remove &  Discard patch within 12 hours or as directed by MD Patient taking differently: Place 1 patch onto the skin daily. Remove & Discard patch within 12 hours or as directed by MD 11/22/20   McLean-Scocuzza, Nino Glow, MD  lovastatin (MEVACOR) 20 MG tablet TAKE 1 TABLET BY MOUTH EVERYDAY AT BEDTIME *STOP TALKING '40MG'$ * 08/01/21   McLean-Scocuzza, Nino Glow, MD  methocarbamol (ROBAXIN) 500 MG tablet Take 500 mg by mouth 4 (four) times daily as needed. Taking twice a day currently 01/23/21   [provider]  mirabegron ER (MYRBETRIQ) 50 MG TB24 tablet Take 1 tablet (50 mg total) by mouth daily. 12/04/20 11/29/21  Vaillancourt, Aldona Bar, PA-C  montelukast (SINGULAIR) 10 MG tablet TAKE 1 TABLET BY MOUTH EVERY DAY 03/24/21   McLean-Scocuzza, Nino Glow, MD  multivitamin-lutein [facility]) CAPS capsule Take 1 capsule by mouth at bedtime.    [provider]  nebivolol (BYSTOLIC) 5 MG tablet Take 1 tablet (5 mg total) by mouth in the morning and at bedtime. (Note dose changed from 1/2 10 mg bid to 5 mg bid) 08/01/21   McLean-Scocuzza, Nino Glow, MD  oxyCODONE-acetaminophen (PERCOCET) 5-325 MG tablet Take 1 tablet by mouth every 4 (four) hours as needed for severe pain. Patient not taking: Reported on 09/02/2021 07/17/21   Harvest Dark, MD  OXYGEN Inhale 2 L into the lungs at bedtime.    [provider]  pantoprazole (PROTONIX) 40 MG tablet TAKE 1 TABLET BY MOUTH 2 TIMES DAILY 30 MIN BEFORE FOOD (NOTE REDUCTION IN FREQUENCY) 07/07/21   McLean-Scocuzza, Nino Glow, MD  QUEtiapine (SEROQUEL) 25 MG tablet TAKE 1 TABLET (25 MG TOTAL) BY MOUTH AT BEDTIME. AGAIN LAST FILL FURTHER REFILLS FROM PSYCHIATRY NO EXCEPTIONS 09/13/20   McLean-Scocuzza, Nino Glow, MD  sucralfate (CARAFATE) 1 g tablet Take 1 tablet (1 g total) by mouth 2 (two) times daily. 09/17/20 09/12/21  Jonathon Bellows, MD  torsemide (DEMADEX) 20 MG tablet Take 1 tablet (20 mg total) by mouth daily. 08/01/21   Ralene Muskrat B, MD  zoledronic acid  (RECLAST) 5 MG/100ML SOLN injection Inject 5 mg into the vein once.    [provider]    Physical Exam   Triage Vital Signs: ED Triage Vitals  Enc Vitals Group     BP 09/08/21 0603 133/73     Pulse Rate 09/08/21  0601 86     Resp 09/08/21 0601 (!) 22     Temp --      Temp Source 09/08/21 0601 Oral     SpO2 09/08/21 0601 92 %     Weight 09/08/21 0602 178 lb (80.7 kg)     Height 09/08/21 0602 '5\' 1"'$  (1.549 m)     Head Circumference --      Peak Flow --      Pain Score 09/08/21 0603 5     Pain Loc --      Pain Edu? --      Excl. in [place]? --     Most recent vital signs: Vitals:   09/08/21 0650 09/08/21 0729  BP: (!) 119/58 (!) 134/51  Pulse: 84 91  Resp:  (!) 24  Temp:    SpO2: 95% 95%    CONSTITUTIONAL: Alert and oriented and responds appropriately to questions.  Elderly, in no distress HEAD: Normocephalic, atraumatic EYES: Conjunctivae clear, pupils appear equal, sclera nonicteric ENT: normal nose; moist mucous membranes NECK: Supple, normal ROM CARD: RRR; S1 and S2 appreciated; no murmurs, no clicks, no rubs, no gallops RESP: Normal chest excursion without splinting or tachypnea; breath sounds clear and equal bilaterally; no wheezes, no rhonchi, no rales, no hypoxia or respiratory distress, speaking full sentences, slightly diminished aeration at bases bilaterally ABD/GI: Normal bowel sounds; non-distended; soft, non-tender, no rebound, no guarding, no peritoneal signs BACK: The back appears normal EXT: Normal ROM in all joints; no deformity noted, no edema; no cyanosis, no calf tenderness or swelling SKIN: Normal color for age and race; warm; no rash on exposed skin NEURO: Moves all extremities equally, normal speech PSYCH: The patient's mood and manner are appropriate.   ED Results / Procedures / Treatments   LABS: (all labs ordered are listed, but only abnormal results are displayed) Labs Reviewed  CBC WITH DIFFERENTIAL/PLATELET - Abnormal; Notable for  the following components:      Result Value   WBC 18.1 (*)    RBC 3.83 (*)    Hemoglobin 8.8 (*)    HCT 30.8 (*)    MCH 23.0 (*)    MCHC 28.6 (*)    RDW 17.2 (*)    Neutro Abs 11.7 (*)    Eosinophils Absolute 1.3 (*)    Abs Immature Granulocytes 0.10 (*)    All other components within normal limits  COMPREHENSIVE METABOLIC PANEL - Abnormal; Notable for the following components:   Glucose, Bld 159 (*)    Creatinine, Ser 1.34 (*)    Calcium 8.4 (*)    Albumin 3.3 (*)    GFR, Estimated 41 (*)    All other components within normal limits  BLOOD GAS, ARTERIAL - Abnormal; Notable for the following components:   pO2, Arterial 67 (*)    All other components within normal limits  BRAIN NATRIURETIC PEPTIDE - Abnormal; Notable for the following components:   B Natriuretic Peptide 345.9 (*)    All other components within normal limits  SARS CORONAVIRUS 2 BY RT PCR  CULTURE, BLOOD (ROUTINE X 2)  CULTURE, BLOOD (ROUTINE X 2)  URINE CULTURE  PROCALCITONIN  LACTIC ACID, PLASMA  LACTIC ACID, PLASMA  URINALYSIS, ROUTINE W REFLEX MICROSCOPIC  TROPONIN I (HIGH SENSITIVITY)  TROPONIN I (HIGH SENSITIVITY)     EKG:  EKG Interpretation  Date/Time:  Monday September 08 2021 06:02:49 EDT Ventricular Rate:  92 PR Interval:  109 QRS Duration: 94 QT Interval:  364 QTC Calculation: 451 R  Axis:   55 Text Interpretation: Sinus rhythm Short PR interval Borderline repolarization abnormality No significant change since last tracing Confirmed by Pryor Curia [phone]) on 09/08/2021 6:04:12 AM         RADIOLOGY: My personal review and interpretation of imaging: Chest x-ray shows bilateral bibasilar consolidation and bilateral pleural effusions.  I have personally reviewed all radiology reports.   DG Chest Portable 1 View  Result Date: 09/08/2021 CLINICAL DATA:  Shortness of breath. EXAM: PORTABLE CHEST 1 VIEW COMPARISON:  08/20/2021 FINDINGS: Markedly rotated film. The cardio pericardial silhouette  is enlarged. Bibasilar collapse/consolidation with small bilateral pleural effusions. There is pulmonary vascular congestion without overt pulmonary edema. Status post bilateral shoulder replacement. Telemetry leads overlie the chest. IMPRESSION: Markedly rotated film with bibasilar collapse/consolidation and small bilateral pleural effusions. Electronically Signed   By: Misty Stanley M.D.   On: 09/08/2021 07:00     PROCEDURES:  Critical Care performed: Yes, see critical care procedure note(s)   CRITICAL CARE Performed by: Pryor Curia   Total critical care time: 45 minutes  Critical care time was exclusive of separately billable procedures and treating other patients.  Critical care was necessary to treat or prevent imminent or life-threatening deterioration.  Critical care was time spent personally by me on the following activities: development of treatment plan with patient and/or surrogate as well as nursing, discussions with consultants, evaluation of patient's response to treatment, examination of patient, obtaining history from patient or surrogate, ordering and performing treatments and interventions, ordering and review of laboratory studies, ordering and review of radiographic studies, pulse oximetry and re-evaluation of patient's condition.   Marland Kitchen1-3 Lead EKG Interpretation  Performed by: Jacquelene Kopecky, Delice Bison, DO Authorized by: Verenise Moulin, Delice Bison, DO     Interpretation: normal     ECG rate:  84   ECG rate assessment: normal     Rhythm: sinus rhythm     Ectopy: none     Conduction: normal       IMPRESSION / MDM / ASSESSMENT AND PLAN / ED COURSE  I reviewed the triage vital signs and the nursing notes.    Patient here with shortness of breath.  History of recurrent pneumonia, COPD and CHF.  On CPAP for hypoxia.  The patient is on the cardiac monitor to evaluate for evidence of arrhythmia and/or significant heart rate changes.   DIFFERENTIAL DIAGNOSIS (includes but not  limited to):   COPD, CHF, ACS, PE, PNA, COVID or other URI, PTX   Patient's presentation is most consistent with acute presentation with potential threat to life or bodily function.   PLAN: Patient here with acute shortness of breath and hypoxia on her normal 3 L nasal cannula.  We will continue breathing treatments.  She has received Solu-Medrol.  We were able to transition off of CPAP onto her nasal cannula.  She has some diminished aeration but no wheezing currently.  We will obtain CBC, BMP, troponin, BNP, chest x-ray, COVID swab, ABG.   MEDICATIONS GIVEN IN ED: Medications  piperacillin-tazobactam (ZOSYN) IVPB 3.375 g (has no administration in time range)  HYDROcodone-acetaminophen (NORCO/VICODIN) 5-325 MG per tablet 1 tablet (has no administration in time range)  ipratropium-albuterol (DUONEB) 0.5-2.5 (3) MG/3ML nebulizer solution 3 mL (3 mLs Nebulization Given 09/08/21 0618)  albuterol (PROVENTIL) (2.5 MG/3ML) 0.083% nebulizer solution 5 mg (5 mg Nebulization Given 09/08/21 0728)  ipratropium (ATROVENT) nebulizer solution 0.5 mg (0.5 mg Nebulization Given 09/08/21 0728)     ED COURSE: Patient's labs show leukocytosis which appears  to be from something that she has had multiple times recently.  Her last dose of steroids would have been July 26.  She denies productive cough or fever but has had recurrent pneumonia.  She has chronic and stable anemia.  Chronic kidney disease is stable as well.  ABG is reassuring other than a PaO2 of 67.  Troponin negative.  BNP 345 she does not appear diffusely volume overloaded.  On reevaluation she is now wheezing again with inspiratory and expiratory wheezing.  We will give her fifth breathing treatment and I feel she may need admission for COPD.  No respiratory distress.  Satting 93% on her normal 3 L nasal cannula.  Chest x-ray was reviewed and interpreted by myself and radiologist and shows bibasilar consolidation, collapse with bilateral pleural effusions.   She is rotated on the x-ray which may be affecting the image but given she has history of recurrent pneumonia and worsening leukocytosis than previous and has not been on steroids for almost 2 weeks, will give Zosyn to cover for HCAP. On review of her records she has received dizziness and before without any issues.  Patient does have multiple drug allergies.    7:42 AM  On reevaluation patient is complaining of right hip pain.  She states she fell "sometime last week" and injured her right foot.  She does have some bruising to the dorsal right foot around the third toe.  She states that she did not develop hip pain until about 2 to 3 days ago.  She is not sure if she did something to injure the hip when she fell.  She has had a total hip replacement.  She is adamant that she did not hit her head but she is on blood thinners.  No neck or back pain.  Will obtain x-rays of the right foot.  Will provide with pain medication.  CONSULTS:  Consulted and discussed patient's case with hospitalist, Dr. Francine Graven.  I have recommended admission and consulting physician agrees and will place admission orders.  Patient (and family if present) agree with this plan.   I reviewed all nursing notes, vitals, pertinent previous records.  All labs, EKGs, imaging ordered have been independently reviewed and interpreted by myself.    OUTSIDE RECORDS REVIEWED: Reviewed patient's recent admission to the hospital in 2023.  Reviewed recent PCP and cardiology notes after discharge as well.       FINAL CLINICAL IMPRESSION(S) / ED DIAGNOSES   Final diagnoses:  COPD exacerbation ([place])  HCAP (healthcare-associated pneumonia)  Fall, initial encounter  Right hip pain     Rx / DC Orders   ED Discharge Orders     None        Note:  This document was prepared using Dragon voice recognition software and may include unintentional dictation errors.   Kerigan Narvaez, Delice Bison, DO 09/08/21 [place], Delice Bison,  DO 09/08/21 [phone]

## 2021-09-08 NOTE — Assessment & Plan Note (Signed)
Stable Continue Eliquis as primary prophylaxis for an acute stroke Continue Bystolic for rate control

## 2021-09-08 NOTE — ED Notes (Signed)
Report given to [facility], RN to assume care

## 2021-09-08 NOTE — Progress Notes (Signed)
Admission profile updated. ?

## 2021-09-08 NOTE — Assessment & Plan Note (Addendum)
Stable Continue Arava

## 2021-09-08 NOTE — Progress Notes (Signed)
       CROSS COVER NOTE  NAME: Kerri Carter MRN: [id number] DOB : 12-04-1940    Date of Service   09/08/21  HPI/Events of Note   Request received from nursing for fluid bolus or lab redraw for elevated Lactic acid at 11:28AM  Interventions   Plan: Lactic Acid 3.2- downtrending from 3.8 at [place]    This document was prepared using Dragon voice recognition software and may include unintentional dictation errors.  Neomia Glass DNP, MHA, FNP-BC Nurse Practitioner [facility] Pager [phone]

## 2021-09-08 NOTE — Assessment & Plan Note (Signed)
Stable.  Continue PPI. 

## 2021-09-08 NOTE — Evaluation (Signed)
Clinical/Bedside Swallow Evaluation Patient Details  Name: Kerri Carter MRN: [id number] Date of Birth: Jul 28, 1940  Today's Date: 09/08/2021 Time: SLP Start Time (ACUTE ONLY): 1300 SLP Stop Time (ACUTE ONLY): 1335 SLP Time Calculation (min) (ACUTE ONLY): 35 min  Past Medical History:  Past Medical History:  Diagnosis Date   Anxiety    Aortic stenosis 07/09/2015   a.) TTE 07/06/2015: EF 55-60%; mild AS with MPG 13.0 mmHg.   Arrhythmia    atrial fibrillation   Asthma    Bipolar disorder ([place])    C. difficile diarrhea 2010   a.) following ABX course during hospital admission   Carotid atherosclerosis, bilateral    a.) Moderate; < 50% stenosis BILATERAL ICAs.   Cataract    a.) s/p BILATERAL extraction   CHF (congestive heart failure) (HCC)    Chicken pox    CKD (chronic kidney disease), stage III (HCC)    a. s/p R nephrectomy./ aneurysm   Conversion disorder    COPD (chronic obstructive pulmonary disease) (HCC)    Depression    Essential hypertension    GERD (gastroesophageal reflux disease)    Heart murmur    Hyperlipidemia    ILD (interstitial lung disease) (HCC)    mild; 2/2 RA diagnosis   Inflammatory arthritis    a. hands/carpal tunnel.  b. Low titer rheumatoid factor. c. Negative anti-CCP antibodies. d. Plaquenil.   Macular degeneration    Nocturnal hypoxemia    Non-Obstructive CAD    a. 07/2009 Cath ([facility]): nonobs dzs;  b. 03/2011 Cath [facility]): nonobs dzs.   Osteoarthritis    a. Knees.   PAD (peripheral artery disease) (HCC)    PUD (peptic ulcer disease)    S/P right hip fracture    11/01/16 s/p repair   Shoulder pain    Sleep apnea    no cpap / minimal   Spinal stenosis at L4-L5 level    severe with L4/L5 anterolisthesis grade 1 anterolisthesis    Toxic maculopathy    Valvular heart disease    a.) TTE 07/06/2015: EF 55-60%; Mild MR/AR/TR; Mild AS with MPG 13.0 mmHg.   Past Surgical History:  Past Surgical History:  Procedure Laterality Date   APPENDECTOMY      APPLICATION OF INTRAOPERATIVE CT SCAN N/A 05/23/2021   Procedure: APPLICATION OF INTRAOPERATIVE CT SCAN;  Surgeon: Meade Maw, MD;  Location: [facility];  Service: Neurosurgery;  Laterality: N/A;   BACK SURGERY     lumbar fusion   BUNIONECTOMY Right    CATARACT EXTRACTION, BILATERAL     CESAREAN SECTION     x1   CHOLECYSTECTOMY N/A 05/11/2016   Procedure: LAPAROSCOPIC CHOLECYSTECTOMY;  Surgeon: Florene Glen, MD;  Location: [facility];  Service: General;  Laterality: N/A;   COLONOSCOPY WITH PROPOFOL N/A 04/02/2016   Procedure: COLONOSCOPY WITH PROPOFOL;  Surgeon: Jonathon Bellows, MD;  Location: [facility];  Service: Endoscopy;  Laterality: N/A;   ENDOSCOPIC RETROGRADE CHOLANGIOPANCREATOGRAPHY (ERCP) WITH PROPOFOL N/A 05/08/2016   Procedure: ENDOSCOPIC RETROGRADE CHOLANGIOPANCREATOGRAPHY (ERCP) WITH PROPOFOL;  Surgeon: Lucilla Lame, MD;  Location: [facility];  Service: Endoscopy;  Laterality: N/A;   ERCP     with biliary spincterotomy 05/08/16 Dr. Allen Norris for choledocholithiasis    ESOPHAGEAL DILATION  04/02/2016   Procedure: ESOPHAGEAL DILATION;  Surgeon: Jonathon Bellows, MD;  Location: [facility];  Service: Endoscopy;;   ESOPHAGOGASTRODUODENOSCOPY (EGD) WITH PROPOFOL N/A 04/02/2016   Procedure: ESOPHAGOGASTRODUODENOSCOPY (EGD) WITH PROPOFOL;  Surgeon: Jonathon Bellows, MD;  Location: [facility];  Service: Endoscopy;  Laterality:  N/A;   HIP ARTHROPLASTY Right 11/01/2016   Procedure: ARTHROPLASTY BIPOLAR HIP (HEMIARTHROPLASTY);  Surgeon: Corky Mull, MD;  Location: [facility];  Service: Orthopedics;  Laterality: Right;   LUMBAR LAMINECTOMY/DECOMPRESSION MICRODISCECTOMY N/A 12/11/2020   Procedure: LEFT L2-3 MICRODISCECTOMY, L3-4 AND L5-S1 DECOMPRESSION;  Surgeon: Meade Maw, MD;  Location: [facility];  Service: Neurosurgery;  Laterality: N/A;   NEPHRECTOMY  1988   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   ORIF SCAPHOID FRACTURE Left 12/21/2019   Procedure: OPEN  REDUCTION INTERNAL FIXATION (ORIF) OF LEFT SCAPULAR NONUNION WITH BONE GRAFT;  Surgeon: Corky Mull, MD;  Location: [facility];  Service: Orthopedics;  Laterality: Left;   osteoporosis     noted DEXA 08/19/16    REPLACEMENT TOTAL KNEE Right    REVERSE SHOULDER ARTHROPLASTY Right 11/04/2017   Procedure: REVERSE SHOULDER ARTHROPLASTY;  Surgeon: Corky Mull, MD;  Location: [facility];  Service: Orthopedics;  Laterality: Right;   REVERSE SHOULDER ARTHROPLASTY Left 07/26/2018   Procedure: REVERSE SHOULDER ARTHROPLASTY;  Surgeon: Corky Mull, MD;  Location: [facility];  Service: Orthopedics;  Laterality: Left;   TONSILLECTOMY     TOTAL HIP ARTHROPLASTY  12/10/11   [facility] left hip   TOTAL HIP ARTHROPLASTY Bilateral    TRANSFORAMINAL LUMBAR INTERBODY FUSION (TLIF) WITH PEDICLE SCREW FIXATION 3 LEVEL N/A 05/23/2021   Procedure: OPEN L2-4 TRANSFORAMINAL LUMBAR INTERBODY FUSION (TLIF) WITH L2-5 POSTERIOR SPINAL FUSION;  Surgeon: Meade Maw, MD;  Location: [facility];  Service: Neurosurgery;  Laterality: N/A;   TUBAL LIGATION     HPI:  Per H&P "[name] is a 81 y.o. female with medical history significant for COPD with chronic respiratory failure on 3 L of oxygen, history of chronic diastolic dysfunction CHF, stage IIIb chronic kidney disease, depression, hypertension, GERD, interstitial lung disease, peripheral arterial disease, bipolar disorder who presents to the ER via EMS for evaluation of worsening shortness of breath.  Patient was recently hospitalized from 07/19 - 07/21 and was discharged home on a steroid taper.  Prior to the hospitalization she was admitted from 06/20 - 06/22 for community-acquired pneumonia.  She states that symptoms started 2 days prior to her presentation and has steadily worsened since then.  Shortness of breath is associated with a nonproductive and wheezing.  She denies having any fever or chills and denies having any sick contacts.  Per EMS she had pulse oximetry of 84% on  her 3 L of oxygen and was placed on a CPAP prior to transport to the ER.  She received DuoNeb, albuterol and 125 mg of IV Solu-Medrol.  She denies having any chest pain, no nausea, no vomiting, no abdominal pain, no changes in her bowel habits, no urinary symptoms, no dizziness, no lightheadedness, no leg swelling, no headache, no blurred vision no focal deficit."   CT Chest: "1. Interval development of patchy ground-glass airspace disease in the right upper lobe and posterior left upper lobe, likely infectious/inflammatory. 2. Interval development of consolidative airspace disease in both lower lobes which may be in part related to atelectasis although consolidative pneumonia, notably in the right lower lobe, is suspected. 3. Small bilateral pleural effusions. 4. Enlargement of the pulmonary outflow tract/main pulmonary arteries suggests pulmonary arterial hypertension. 5. Aortic Atherosclerosis (ICD10-I70.0)."   Assessment / Plan / Recommendation  Clinical Impression  Pt seen for clinical swallowing evaluation. Pt alert, pleasant, and cooperative. Denies s/sx oropharyngeal dysphagia. On 3L/min O2 via [place] which is pt's baseline supplemental O2 requirement.  Cleared with RN who noted pt with no observed or reported swallowing difficult.  Pt known to SLP services from admission in April 2023. At that time a regular consistency diet with thin liquids with safe swallowing strategies/aspiration precautions/reflux precautions was recommended.   Pt given trials of solid and thin liquids (via straw sip). Pt politely declined pureed trials. Pt demonstrated a grossly functional oral swallow c/b mildly prolonged, but functional, mastication of solids. Pharyngeal swallow appeared [facility] per clinical assessment. To palpation, pt with seemingly timely swallow initiation and seemingly adequate laryngeal elevation. No overt or subtle s/sx pharyngeal dysphagia noted. No change to vocal quality or vital signs appreciated.    Recommend continuation of a regular consistency diet with thin liquids and safe swallowing strategies/aspiration precautions/reflux precautions.   Reviewed results of assessment, diet recommendations, safe swallowing strategies/aspiration precautions, reflux precautions, rationale for recommendations/strategies, and SLP POC with pt and RN. Pt verbalized understanding/agreement.   Given clinical risk factors including recurrent PNA, respiratory status (e.g. COPD, O2 dependence), and hx of GERD/esophageal issues (including esophageal dilation), SLP to f/u x1 for diet tolerance with consideration for MBSS, as indicated.   SLP Visit Diagnosis: Dysphagia, unspecified (R13.10)    Aspiration Risk  Mild aspiration risk    Diet Recommendation Regular;Thin liquid   Medication Administration:  (as tolerated) Supervision: Patient able to self feed Compensations: Slow rate;Small sips/bites (rest breaks PRN; reflux precautions) Postural Changes: Seated upright at 90 degrees;Remain upright for at least 30 minutes after po intake    Other  Recommendations Oral Care Recommendations: Oral care QID;Staff/trained caregiver to provide oral care    Recommendations for follow up therapy are one component of a multi-disciplinary discharge planning process, led by the attending physician.  Recommendations may be updated based on patient status, additional functional criteria and insurance authorization.  Follow up Recommendations No SLP follow up (likely no SLP f/u indicated)      Assistance Recommended at Discharge None (for swallowing)  Functional Status Assessment Patient has had a recent decline in their functional status and demonstrates the ability to make significant improvements in function in a reasonable and predictable amount of time.  Frequency and Duration min 1 x/week  1 week       Prognosis Prognosis for Safe Diet Advancement: Good Barriers to Reach Goals:  (comorbidities)       Swallow Study   General Date of Onset: 09/08/21 HPI: Per H&P "JULLISA GRIGORYAN is a 81 y.o. female with medical history significant for COPD with chronic respiratory failure on 3 L of oxygen, history of chronic diastolic dysfunction CHF, stage IIIb chronic kidney disease, depression, hypertension, GERD, interstitial lung disease, peripheral arterial disease, bipolar disorder who presents to the ER via EMS for evaluation of worsening shortness of breath.  Patient was recently hospitalized from 07/19 - 07/21 and was discharged home on a steroid taper.  Prior to the hospitalization she was admitted from 06/20 - 06/22 for community-acquired pneumonia.  She states that symptoms started 2 days prior to her presentation and has steadily worsened since then.  Shortness of breath is associated with a nonproductive and wheezing.  She denies having any fever or chills and denies having any sick contacts.  Per EMS she had pulse oximetry of 84% on her 3 L of oxygen and was placed on a CPAP prior to transport to the ER.  She received DuoNeb, albuterol and 125 mg of IV Solu-Medrol.  She denies having any chest pain, no nausea, no vomiting, no abdominal pain,  no changes in her bowel habits, no urinary symptoms, no dizziness, no lightheadedness, no leg swelling, no headache, no blurred vision no focal deficit." Type of Study: Bedside Swallow Evaluation Previous Swallow Assessment: known to SLP services; seen earlier this year with recommendation for regular diet with thin liquids Diet Prior to this Study: Regular;Thin liquids Temperature Spikes Noted: No Respiratory Status: Nasal cannula (3L/mni) History of Recent Intubation: No Behavior/Cognition: Alert;Cooperative;Pleasant mood Oral Cavity Assessment: Within Functional Limits Oral Care Completed by SLP: No Oral Cavity - Dentition: Dentures, top;Dentures, bottom Vision: Functional for self-feeding Self-Feeding Abilities: Able to feed self Patient Positioning:  Upright in bed Baseline Vocal Quality: Normal Volitional Cough: Strong Volitional Swallow: Able to elicit    Oral/Motor/Sensory Function Overall Oral Motor/Sensory Function: Within functional limits   Ice Chips Ice chips: Not tested   Thin Liquid Thin Liquid: Within functional limits Presentation: Self Fed;Straw Other Comments: ~6 oz; single and consecutive sips    Nectar Thick Nectar Thick Liquid: Not tested   Honey Thick Honey Thick Liquid: Not tested   Puree Puree: Not tested Other Comments: pt declined   Solid     Solid: Within functional limits Presentation: Self Fed Other Comments: mildly prolonged, but functional, mastication of solids     Cherrie Gauze, M.S., [facility] [phone] [place])   Quintella Baton 09/08/2021,2:04 PM

## 2021-09-08 NOTE — Assessment & Plan Note (Signed)
Place patient on scheduled and as needed bronchodilator therapy Continue inhaled steroids Continue oxygen supplementation at 3 L to maintain pulse oximetry greater than 92%

## 2021-09-08 NOTE — Assessment & Plan Note (Addendum)
Stable Continue BuSpar, Lexapro and Seroquel

## 2021-09-08 NOTE — Assessment & Plan Note (Signed)
Patient presents for evaluation of worsening shortness of breath from her baseline and was hypoxic upon presentation. BNP is elevated and chest x-ray shows small bilateral pleural effusions. Last known LVEF is 55 to 60% from a 2D cardiogram which was done 06/23 --started on Lasix 40 mg IV daily on presentation Plan: --cont IV lasix 40 mg daily --monitor Cr while diuresing

## 2021-09-08 NOTE — ED Notes (Signed)
Report to courtney, rn.

## 2021-09-08 NOTE — ED Notes (Signed)
Date and time results received: 09/08/21 0921  Test: Lactic Critical Value: 3.1  Name of Provider Notified: Dr. Francine Graven   Orders Received? Or Actions Taken?: Dr. Francine Graven notified

## 2021-09-08 NOTE — ED Notes (Signed)
Pt states she is feeling improved. Resps unlabored.

## 2021-09-08 NOTE — ED Notes (Signed)
Pt ate breakfast tray of 2 pancakes, 1 sausage patty, 1 container juice, 1 cup coffee.

## 2021-09-08 NOTE — Consult Note (Signed)
Reason for Consult:right 5th toe fraacture Referring Physician: Dr Tonia Ghent Kerri Carter is an 81 y.o. female.  HPI: she is admitted for SOB and suffered a fall last week she has had foot pain and bruising since that time. Xrays were taken   Past Medical History:  Diagnosis Date   Anxiety    Aortic stenosis 07/09/2015   a.) TTE 07/06/2015: EF 55-60%; mild AS with MPG 13.0 mmHg.   Arrhythmia    atrial fibrillation   Asthma    Bipolar disorder ([place])    C. difficile diarrhea 2010   a.) following ABX course during hospital admission   Carotid atherosclerosis, bilateral    a.) Moderate; < 50% stenosis BILATERAL ICAs.   Cataract    a.) s/p BILATERAL extraction   CHF (congestive heart failure) (HCC)    Chicken pox    CKD (chronic kidney disease), stage III (HCC)    a. s/p R nephrectomy./ aneurysm   Conversion disorder    COPD (chronic obstructive pulmonary disease) (HCC)    Depression    Essential hypertension    GERD (gastroesophageal reflux disease)    Heart murmur    Hyperlipidemia    ILD (interstitial lung disease) (HCC)    mild; 2/2 RA diagnosis   Inflammatory arthritis    a. hands/carpal tunnel.  b. Low titer rheumatoid factor. c. Negative anti-CCP antibodies. d. Plaquenil.   Macular degeneration    Nocturnal hypoxemia    Non-Obstructive CAD    a. 07/2009 Cath ([facility]): nonobs dzs;  b. 03/2011 Cath [facility]): nonobs dzs.   Osteoarthritis    a. Knees.   PAD (peripheral artery disease) (HCC)    PUD (peptic ulcer disease)    S/P right hip fracture    11/01/16 s/p repair   Shoulder pain    Sleep apnea    no cpap / minimal   Spinal stenosis at L4-L5 level    severe with L4/L5 anterolisthesis grade 1 anterolisthesis    Toxic maculopathy    Valvular heart disease    a.) TTE 07/06/2015: EF 55-60%; Mild MR/AR/TR; Mild AS with MPG 13.0 mmHg.    Past Surgical History:  Procedure Laterality Date   APPENDECTOMY     APPLICATION OF INTRAOPERATIVE CT SCAN N/A 05/23/2021    Procedure: APPLICATION OF INTRAOPERATIVE CT SCAN;  Surgeon: Meade Maw, MD;  Location: [facility];  Service: Neurosurgery;  Laterality: N/A;   BACK SURGERY     lumbar fusion   BUNIONECTOMY Right    CATARACT EXTRACTION, BILATERAL     CESAREAN SECTION     x1   CHOLECYSTECTOMY N/A 05/11/2016   Procedure: LAPAROSCOPIC CHOLECYSTECTOMY;  Surgeon: Florene Glen, MD;  Location: [facility];  Service: General;  Laterality: N/A;   COLONOSCOPY WITH PROPOFOL N/A 04/02/2016   Procedure: COLONOSCOPY WITH PROPOFOL;  Surgeon: Jonathon Bellows, MD;  Location: [facility];  Service: Endoscopy;  Laterality: N/A;   ENDOSCOPIC RETROGRADE CHOLANGIOPANCREATOGRAPHY (ERCP) WITH PROPOFOL N/A 05/08/2016   Procedure: ENDOSCOPIC RETROGRADE CHOLANGIOPANCREATOGRAPHY (ERCP) WITH PROPOFOL;  Surgeon: Lucilla Lame, MD;  Location: [facility];  Service: Endoscopy;  Laterality: N/A;   ERCP     with biliary spincterotomy 05/08/16 Dr. Allen Norris for choledocholithiasis    ESOPHAGEAL DILATION  04/02/2016   Procedure: ESOPHAGEAL DILATION;  Surgeon: Jonathon Bellows, MD;  Location: [facility];  Service: Endoscopy;;   ESOPHAGOGASTRODUODENOSCOPY (EGD) WITH PROPOFOL N/A 04/02/2016   Procedure: ESOPHAGOGASTRODUODENOSCOPY (EGD) WITH PROPOFOL;  Surgeon: Jonathon Bellows, MD;  Location: [facility];  Service: Endoscopy;  Laterality: N/A;  HIP ARTHROPLASTY Right 11/01/2016   Procedure: ARTHROPLASTY BIPOLAR HIP (HEMIARTHROPLASTY);  Surgeon: Corky Mull, MD;  Location: [facility];  Service: Orthopedics;  Laterality: Right;   LUMBAR LAMINECTOMY/DECOMPRESSION MICRODISCECTOMY N/A 12/11/2020   Procedure: LEFT L2-3 MICRODISCECTOMY, L3-4 AND L5-S1 DECOMPRESSION;  Surgeon: Meade Maw, MD;  Location: [facility];  Service: Neurosurgery;  Laterality: N/A;   NEPHRECTOMY  1988   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   ORIF SCAPHOID FRACTURE Left 12/21/2019   Procedure: OPEN REDUCTION INTERNAL FIXATION (ORIF) OF LEFT SCAPULAR NONUNION WITH  BONE GRAFT;  Surgeon: Corky Mull, MD;  Location: [facility];  Service: Orthopedics;  Laterality: Left;   osteoporosis     noted DEXA 08/19/16    REPLACEMENT TOTAL KNEE Right    REVERSE SHOULDER ARTHROPLASTY Right 11/04/2017   Procedure: REVERSE SHOULDER ARTHROPLASTY;  Surgeon: Corky Mull, MD;  Location: [facility];  Service: Orthopedics;  Laterality: Right;   REVERSE SHOULDER ARTHROPLASTY Left 07/26/2018   Procedure: REVERSE SHOULDER ARTHROPLASTY;  Surgeon: Corky Mull, MD;  Location: [facility];  Service: Orthopedics;  Laterality: Left;   TONSILLECTOMY     TOTAL HIP ARTHROPLASTY  12/10/11   [facility] left hip   TOTAL HIP ARTHROPLASTY Bilateral    TRANSFORAMINAL LUMBAR INTERBODY FUSION (TLIF) WITH PEDICLE SCREW FIXATION 3 LEVEL N/A 05/23/2021   Procedure: OPEN L2-4 TRANSFORAMINAL LUMBAR INTERBODY FUSION (TLIF) WITH L2-5 POSTERIOR SPINAL FUSION;  Surgeon: Meade Maw, MD;  Location: [facility];  Service: Neurosurgery;  Laterality: N/A;   TUBAL LIGATION      Family History  Problem Relation Age of Onset   Rheum arthritis Mother    Asthma Mother    Parkinson's disease Mother    Heart disease Mother    Stroke Mother    Hypertension Mother    Heart attack Father    Heart disease Father    Hypertension Father    Peripheral Artery Disease Father    Diabetes Son    Gout Son    Asthma Sister    Heart disease Sister    Lung cancer Sister    Heart disease Sister    Heart disease Sister    Breast cancer Sister    Heart attack Sister    Heart disease Brother    Heart disease Maternal Grandmother    Diabetes Maternal Grandmother    Colon cancer Maternal Grandmother    Cancer Maternal Grandmother        Hodgkins lymphoma   Heart disease Brother    Alcohol abuse Brother    Depression Brother    Dementia Son     Social History:  reports that she quit smoking about 47 years ago. Her smoking use included cigarettes. She has a 10.00 pack-year smoking history. She has never used smokeless  tobacco. She reports that she does not drink alcohol and does not use drugs.  Allergies:  Allergies  Allergen Reactions   Ceftin [Cefuroxime Axetil] Anaphylaxis   Lisinopril Anaphylaxis   Sulfa Antibiotics Other (See Comments)    Face swelling   Sulfasalazine Anaphylaxis   Morphine Other (See Comments)    Per patient, low blood pressure issues that requires action to raise it back up. Can take small infrequent doses   Xarelto [Rivaroxaban] Other (See Comments)    Stomach burning, bleeding, and tar in stool   Adhesive [Tape] Rash    Paper tape and tega derm OK   Antihistamines, Chlorpheniramine-Type Other (See Comments)    Makes pt hyper  Antivert [Meclizine Hcl] Other (See Comments)    Bladder will not empty   Aspirin Other (See Comments)    Sulfasalazine allergy cross reacts   Contrast Media [Iodinated Contrast Media] Rash    she is able to use betadine scrubs.   Decongestant [Pseudoephedrine Hcl] Other (See Comments)    Makes pt hyper   Doxycycline Other (See Comments)    GI upset   Levaquin [Levofloxacin In D5w] Rash   Polymyxin B Other (See Comments)    Facial rash   Tetanus Toxoids Rash and Other (See Comments)    Fever and hot to touch at injection site    Medications: I have reviewed the patient's current medications.  Results for orders placed or performed during the hospital encounter of 09/08/21 (from the past 48 hour(s))  CBC with Differential/Platelet     Status: Abnormal   Collection Time: 09/08/21  6:08 AM  Result Value Ref Range   WBC 18.1 (H) 4.0 - 10.5 K/uL   RBC 3.83 (L) 3.87 - 5.11 MIL/uL   Hemoglobin 8.8 (L) 12.0 - 15.0 g/dL   HCT 30.8 (L) 36.0 - 46.0 %   MCV 80.4 80.0 - 100.0 fL   MCH 23.0 (L) 26.0 - 34.0 pg   MCHC 28.6 (L) 30.0 - 36.0 g/dL   RDW 17.2 (H) 11.5 - 15.5 %   Platelets 265 150 - 400 K/uL   nRBC 0.0 0.0 - 0.2 %   Neutrophils Relative % 64 %   Neutro Abs 11.7 (H) 1.7 - 7.7 K/uL   Lymphocytes Relative 22 %   Lymphs Abs 4.0 0.7 -  4.0 K/uL   Monocytes Relative 5 %   Monocytes Absolute 0.9 0.1 - 1.0 K/uL   Eosinophils Relative 7 %   Eosinophils Absolute 1.3 (H) 0.0 - 0.5 K/uL   Basophils Relative 1 %   Basophils Absolute 0.1 0.0 - 0.1 K/uL   Immature Granulocytes 1 %   Abs Immature Granulocytes 0.10 (H) 0.00 - 0.07 K/uL    Comment: Performed at [facility], [address] [place], [place] [address]  Comprehensive metabolic panel     Status: Abnormal   Collection Time: 09/08/21  6:08 AM  Result Value Ref Range   Sodium 139 135 - 145 mmol/L   Potassium 4.3 3.5 - 5.1 mmol/L   Chloride 106 98 - 111 mmol/L   CO2 24 22 - 32 mmol/L   Glucose, Bld 159 (H) 70 - 99 mg/dL    Comment: Glucose reference range applies only to samples taken after fasting for at least 8 hours.   BUN 19 8 - 23 mg/dL   Creatinine, Ser 1.34 (H) 0.44 - 1.00 mg/dL   Calcium 8.4 (L) 8.9 - 10.3 mg/dL   Total Protein 6.7 6.5 - 8.1 g/dL   Albumin 3.3 (L) 3.5 - 5.0 g/dL   AST 24 15 - 41 U/L   ALT 16 0 - 44 U/L   Alkaline Phosphatase 98 38 - 126 U/L   Total Bilirubin 0.6 0.3 - 1.2 mg/dL   GFR, Estimated 41 (L) >60 mL/min    Comment: (NOTE) Calculated using the CKD-EPI Creatinine Equation (2021)    Anion gap 9 5 - 15    Comment: Performed at [facility], [address] [place], [place] [address]  Blood gas, arterial     Status: Abnormal   Collection Time: 09/08/21  6:08 AM  Result Value Ref Range   O2 Content 3.0 L/min   Delivery systems  NASAL CANNULA    pH, Arterial 7.43 7.35 - 7.45   pCO2 arterial 40 32 - 48 mmHg   pO2, Arterial 67 (L) 83 - 108 mmHg   Bicarbonate 26.5 20.0 - 28.0 mmol/L   Acid-Base Excess 2.0 0.0 - 2.0 mmol/L   O2 Saturation 94.8 %   Patient temperature 37.0    Collection site RIGHT RADIAL    Allens test (pass/fail) PASS PASS    Comment: Performed at [facility], [place], [place] [address]  Troponin I (High Sensitivity)     Status: None   Collection Time: 09/08/21  6:08  AM  Result Value Ref Range   Troponin I (High Sensitivity) 16 <18 ng/L    Comment: (NOTE) Elevated high sensitivity troponin I (hsTnI) values and significant  changes across serial measurements may suggest ACS but many other  chronic and acute conditions are known to elevate hsTnI results.  Refer to the "Links" section for chest pain algorithms and additional  guidance. Performed at [facility], [address] [place], [place] [address]   Brain natriuretic peptide     Status: Abnormal   Collection Time: 09/08/21  6:08 AM  Result Value Ref Range   B Natriuretic Peptide 345.9 (H) 0.0 - 100.0 pg/mL    Comment: Performed at [facility], [address] [place], [place] [address]  SARS Coronavirus 2 by RT PCR (hospital order, performed in [facility] hospital lab) *cepheid single result test* Anterior Nasal Swab     Status: None   Collection Time: 09/08/21  6:08 AM   Specimen: Anterior Nasal Swab  Result Value Ref Range   SARS Coronavirus 2 by RT PCR NEGATIVE NEGATIVE    Comment: (NOTE) SARS-CoV-2 target nucleic acids are NOT DETECTED.  The SARS-CoV-2 RNA is generally detectable in upper and lower respiratory specimens during the acute phase of infection. The lowest concentration of SARS-CoV-2 viral copies this assay can detect is 250 copies / mL. A negative result does not preclude SARS-CoV-2 infection and should not be used as the sole basis for treatment or other patient management decisions.  A negative result may occur with improper specimen collection / handling, submission of specimen other than nasopharyngeal swab, presence of viral mutation(s) within the areas targeted by this assay, and inadequate number of viral copies (<250 copies / mL). A negative result must be combined with clinical observations, patient history, and epidemiological information.  Fact Sheet for Patients:   https://www.patel.info/  Fact Sheet for Healthcare  Providers: https://hall.com/  This test is not yet approved or  cleared by the [place] FDA and has been authorized for detection and/or diagnosis of SARS-CoV-2 by FDA under an Emergency Use Authorization (EUA).  This EUA will remain in effect (meaning this test can be used) for the duration of the COVID-19 declaration under Section 564(b)(1) of the Act, 21 U.S.C. section 360bbb-3(b)(1), unless the authorization is terminated or revoked sooner.  Performed at [facility], [address] [place],  [address]   Urinalysis, Routine w reflex microscopic     Status: Abnormal   Collection Time: 09/08/21  7:26 AM  Result Value Ref Range   Color, Urine YELLOW (A) YELLOW   APPearance CLEAR (A) CLEAR   Specific Gravity, Urine 1.013 1.005 - 1.030   pH 5.0 5.0 - 8.0   Glucose, UA NEGATIVE NEGATIVE mg/dL   Hgb urine dipstick NEGATIVE NEGATIVE   Bilirubin Urine NEGATIVE NEGATIVE   Ketones, ur NEGATIVE NEGATIVE mg/dL  Protein, ur NEGATIVE NEGATIVE mg/dL   Nitrite NEGATIVE NEGATIVE   Leukocytes,Ua NEGATIVE NEGATIVE    Comment: Performed at [facility], [address] [place], [place] [address]  Lactic acid, plasma     Status: Abnormal   Collection Time: 09/08/21  8:36 AM  Result Value Ref Range   Lactic Acid, Venous 3.1 (HH) 0.5 - 1.9 mmol/L    Comment: CRITICAL RESULT CALLED TO, READ BACK BY AND VERIFIED WITH Violet Baldy RN [phone] 09/08/21 HNM Performed at [facility], [place], [place] [address]   Troponin I (High Sensitivity)     Status: None   Collection Time: 09/08/21  8:36 AM  Result Value Ref Range   Troponin I (High Sensitivity) 17 <18 ng/L    Comment: (NOTE) Elevated high sensitivity troponin I (hsTnI) values and significant  changes across serial measurements may suggest ACS but many other  chronic and acute conditions are known to elevate hsTnI results.  Refer to the "Links" section for chest  pain algorithms and additional  guidance. Performed at [facility], [address] [place], [place] [address]   Procalcitonin     Status: None   Collection Time: 09/08/21  8:36 AM  Result Value Ref Range   Procalcitonin <0.10 ng/mL    Comment:        Interpretation: PCT (Procalcitonin) <= 0.5 ng/mL: Systemic infection (sepsis) is not likely. Local bacterial infection is possible. (NOTE)       Sepsis PCT Algorithm           Lower Respiratory Tract                                      Infection PCT Algorithm    ----------------------------     ----------------------------         PCT < 0.25 ng/mL                PCT < 0.10 ng/mL          Strongly encourage             Strongly discourage   discontinuation of antibiotics    initiation of antibiotics    ----------------------------     -----------------------------       PCT 0.25 - 0.50 ng/mL            PCT 0.10 - 0.25 ng/mL               OR       >80% decrease in PCT            Discourage initiation of                                            antibiotics      Encourage discontinuation           of antibiotics    ----------------------------     -----------------------------         PCT >= 0.50 ng/mL              PCT 0.26 - 0.50 ng/mL               AND        <80% decrease in PCT  Encourage initiation of                                             antibiotics       Encourage continuation           of antibiotics    ----------------------------     -----------------------------        PCT >= 0.50 ng/mL                  PCT > 0.50 ng/mL               AND         increase in PCT                  Strongly encourage                                      initiation of antibiotics    Strongly encourage escalation           of antibiotics                                     -----------------------------                                           PCT <= 0.25 ng/mL                                                  OR                                        > 80% decrease in PCT                                      Discontinue / Do not initiate                                             antibiotics  Performed at [facility], [address] [place], [place] [address]   Lactic acid, plasma     Status: Abnormal   Collection Time: 09/08/21 11:28 AM  Result Value Ref Range   Lactic Acid, Venous 3.8 (HH) 0.5 - 1.9 mmol/L    Comment: CRITICAL VALUE NOTED. VALUE IS CONSISTENT WITH PREVIOUSLY REPORTED/CALLED VALUE SRR Performed at [facility], [address] [place], [place] [address]     CT CHEST WO CONTRAST  Result Date: 09/08/2021 CLINICAL DATA:  Shortness of breath.  Pneumonia. EXAM: CT CHEST WITHOUT CONTRAST TECHNIQUE: Multidetector CT imaging of the chest was performed following the standard protocol without IV contrast. RADIATION DOSE REDUCTION: This exam was performed according to  the departmental dose-optimization program which includes automated exposure control, adjustment of the mA and/or kV according to patient size and/or use of iterative reconstruction technique. COMPARISON:  07/22/2021 FINDINGS: Cardiovascular: The heart size is normal. No substantial pericardial effusion. Coronary artery calcification is evident. Mild atherosclerotic calcification is noted in the wall of the thoracic aorta. Enlargement of the pulmonary outflow tract/main pulmonary arteries suggests pulmonary arterial hypertension. Mediastinum/Nodes: Scattered small mediastinal lymph nodes evident. No evidence for gross hilar lymphadenopathy although assessment is limited by the lack of intravenous contrast on the current study. The esophagus has normal imaging features. There is no axillary lymphadenopathy. Lungs/Pleura: Patchy ground-glass airspace disease in the right upper lobe and posterior left upper lobe is new in the interval, likely infectious/inflammatory. There is new consolidative airspace disease  in both lower lobes which may be in part related to atelectasis although consolidative pneumonia, notably in the right lower lobe, is suspected. Small bilateral pleural effusions evident. Upper Abdomen: Unremarkable. Musculoskeletal: No worrisome lytic or sclerotic osseous abnormality. Status post left shoulder reverse arthroplasty. Fixation hardware noted right shoulder. Incomplete visualization of lumbar fusion hardware. Multiple nonacute right rib fractures evident IMPRESSION: 1. Interval development of patchy ground-glass airspace disease in the right upper lobe and posterior left upper lobe, likely infectious/inflammatory. 2. Interval development of consolidative airspace disease in both lower lobes which may be in part related to atelectasis although consolidative pneumonia, notably in the right lower lobe, is suspected. 3. Small bilateral pleural effusions. 4. Enlargement of the pulmonary outflow tract/main pulmonary arteries suggests pulmonary arterial hypertension. 5. Aortic Atherosclerosis (ICD10-I70.0). Electronically Signed   By: Misty Stanley M.D.   On: 09/08/2021 09:49   DG Foot Complete Right  Result Date: 09/08/2021 CLINICAL DATA:  Right foot pain after fall last Thursday. Unable to ambulate. EXAM: RIGHT FOOT COMPLETE - 3+ VIEW COMPARISON:  Right ankle x-rays dated January 03, 2015. FINDINGS: Acute minimally impacted fracture at the base of the fifth proximal phalanx. No additional fracture. No dislocation. Postsurgical changes of the first and third metatarsals. Mild degenerative changes of the forefoot. Osteopenia. Dorsal forefoot soft tissue swelling. IMPRESSION: 1. Acute minimally impacted fracture at the base of the fifth proximal phalanx. Electronically Signed   By: Titus Dubin M.D.   On: 09/08/2021 08:46   DG Hip Unilat W or Wo Pelvis 2-3 Views Right  Result Date: 09/08/2021 CLINICAL DATA:  Right hip pain after fall last Thursday. Unable to ambulate. EXAM: DG HIP (WITH OR WITHOUT  PELVIS) 2-3V RIGHT COMPARISON:  CT abdomen pelvis dated February 10, 2021. FINDINGS: No acute fracture or dislocation. Bilateral hip arthroplasties and lumbar fusion. No evidence of hardware failure or loosening. Soft tissues are unremarkable. IMPRESSION: 1. No acute osseous abnormality. Electronically Signed   By: Titus Dubin M.D.   On: 09/08/2021 08:42   DG Chest Portable 1 View  Result Date: 09/08/2021 CLINICAL DATA:  Shortness of breath. EXAM: PORTABLE CHEST 1 VIEW COMPARISON:  08/20/2021 FINDINGS: Markedly rotated film. The cardio pericardial silhouette is enlarged. Bibasilar collapse/consolidation with small bilateral pleural effusions. There is pulmonary vascular congestion without overt pulmonary edema. Status post bilateral shoulder replacement. Telemetry leads overlie the chest. IMPRESSION: Markedly rotated film with bibasilar collapse/consolidation and small bilateral pleural effusions. Electronically Signed   By: Misty Stanley M.D.   On: 09/08/2021 07:00    Review of Systems  Constitutional:  Positive for malaise/fatigue.  Respiratory:  Positive for shortness of breath.   Musculoskeletal:  Positive for falls.  Toe pain   All other systems reviewed and are negative.  Blood pressure (!) 123/59, pulse 93, temperature 98.8 F (37.1 C), temperature source Oral, resp. rate 15, height '5\' 1"'$  (1.549 m), weight 80.7 kg, SpO2 96 %.  Vitals:   09/08/21 1400 09/08/21 1530  BP: (!) 129/99 (!) 123/59  Pulse: 92 93  Resp: (!) 24 15  Temp:  98.8 F (37.1 C)  SpO2: 95% 96%    General AA&O x3. Normal mood and affect.  Vascular Dorsalis pedis and posterior tibial pulses  present 2+ right  Capillary refill normal to all digits. Pedal hair growth normal.  Neurologic Epicritic sensation grossly present.  Dermatologic (Wound) Distal forefoot ecchymosis   Orthopedic: Motor intact BLE. Pain on 5th proximal phalanx    Assessment/Plan:  R closed non displaced 5th toe fracture  -Imaging:  Studies independently reviewed -WBAT in post op shoe. Order placed -Recommended non operative treatment with shoe for immobilization.  Discussed buddy splinting. Expect this will  heal uneventfully but will take some time.  -Will sign off. Advised she  may f/u with [place] if further  issues in 1 month. Please call or message f further questions/concerns   Criselda Peaches 09/08/2021, 4:57 PM   Best available via secure chat for questions or concerns.

## 2021-09-09 ENCOUNTER — Inpatient Hospital Stay: Payer: Medicare PPO

## 2021-09-09 ENCOUNTER — Encounter: Payer: Self-pay | Admitting: Internal Medicine

## 2021-09-09 DIAGNOSIS — S92911A Unspecified fracture of right toe(s), initial encounter for closed fracture: Secondary | ICD-10-CM

## 2021-09-09 DIAGNOSIS — J9620 Acute and chronic respiratory failure, unspecified whether with hypoxia or hypercapnia: Secondary | ICD-10-CM | POA: Diagnosis not present

## 2021-09-09 HISTORY — DX: Unspecified fracture of right toe(s), initial encounter for closed fracture: S92.911A

## 2021-09-09 LAB — BASIC METABOLIC PANEL
Anion gap: 9 (ref 5–15)
BUN: 23 mg/dL (ref 8–23)
CO2: 26 mmol/L (ref 22–32)
Calcium: 8.1 mg/dL — ABNORMAL LOW (ref 8.9–10.3)
Chloride: 104 mmol/L (ref 98–111)
Creatinine, Ser: 1.39 mg/dL — ABNORMAL HIGH (ref 0.44–1.00)
GFR, Estimated: 39 mL/min — ABNORMAL LOW (ref >60)
Glucose, Bld: 141 mg/dL — ABNORMAL HIGH (ref 70–99)
Potassium: 4.1 mmol/L (ref 3.5–5.1)
Sodium: 139 mmol/L (ref 135–145)

## 2021-09-09 LAB — URINE CULTURE: Culture: NO GROWTH

## 2021-09-09 LAB — CBC
HCT: 23.2 % — ABNORMAL LOW (ref 36.0–46.0)
Hemoglobin: 6.9 g/dL — ABNORMAL LOW (ref 12.0–15.0)
MCH: 22.5 pg — ABNORMAL LOW (ref 26.0–34.0)
MCHC: 29.7 g/dL — ABNORMAL LOW (ref 30.0–36.0)
MCV: 75.6 fL — ABNORMAL LOW (ref 80.0–100.0)
Platelets: 231 10*3/uL (ref 150–400)
RBC: 3.07 MIL/uL — ABNORMAL LOW (ref 3.87–5.11)
RDW: 17 % — ABNORMAL HIGH (ref 11.5–15.5)
WBC: 17 10*3/uL — ABNORMAL HIGH (ref 4.0–10.5)
nRBC: 0 % (ref 0.0–0.2)

## 2021-09-09 LAB — PREPARE RBC (CROSSMATCH)

## 2021-09-09 MED ORDER — DIPHENHYDRAMINE HCL 25 MG PO CAPS
25.0000 mg | ORAL_CAPSULE | Freq: Once | ORAL | Status: AC
  Administered 2021-09-09: 25 mg via ORAL
  Filled 2021-09-09: qty 1

## 2021-09-09 MED ORDER — GUAIFENESIN ER 600 MG PO TB12
600.0000 mg | ORAL_TABLET | Freq: Two times a day (BID) | ORAL | Status: DC
  Administered 2021-09-09 – 2021-09-12 (×6): 600 mg via ORAL
  Filled 2021-09-09 (×6): qty 1

## 2021-09-09 MED ORDER — MONTELUKAST SODIUM 10 MG PO TABS
10.0000 mg | ORAL_TABLET | Freq: Every day | ORAL | Status: DC
  Administered 2021-09-10 – 2021-09-12 (×3): 10 mg via ORAL
  Filled 2021-09-09 (×3): qty 1

## 2021-09-09 MED ORDER — OXYCODONE HCL 5 MG PO TABS
5.0000 mg | ORAL_TABLET | Freq: Once | ORAL | Status: AC
  Administered 2021-09-09: 5 mg via ORAL
  Filled 2021-09-09: qty 1

## 2021-09-09 MED ORDER — METHYLPREDNISOLONE SODIUM SUCC 125 MG IJ SOLR
125.0000 mg | Freq: Once | INTRAMUSCULAR | Status: AC
  Administered 2021-09-09: 125 mg via INTRAVENOUS
  Filled 2021-09-09: qty 2

## 2021-09-09 MED ORDER — SODIUM CHLORIDE 0.9% IV SOLUTION: Freq: Once | INTRAVENOUS | Status: AC

## 2021-09-09 MED ORDER — ONDANSETRON HCL 4 MG/2ML IJ SOLN
4.0000 mg | Freq: Once | INTRAMUSCULAR | Status: AC
  Administered 2021-09-09: 4 mg via INTRAVENOUS

## 2021-09-09 MED ORDER — FUROSEMIDE 10 MG/ML IJ SOLN
40.0000 mg | Freq: Once | INTRAMUSCULAR | Status: AC
  Administered 2021-09-09: 40 mg via INTRAVENOUS

## 2021-09-09 MED ORDER — SODIUM CHLORIDE 0.9 % IV SOLN
3.0000 g | Freq: Three times a day (TID) | INTRAVENOUS | Status: DC
  Administered 2021-09-09 – 2021-09-12 (×8): 3 g via INTRAVENOUS
  Filled 2021-09-09 (×4): qty 8
  Filled 2021-09-09: qty 3
  Filled 2021-09-09 (×4): qty 8

## 2021-09-09 NOTE — Plan of Care (Signed)
  Problem: Education: Goal: Knowledge of General Education information will improve Description: Including pain rating scale, medication(s)/side effects and non-pharmacologic comfort measures Outcome: Progressing   Problem: Health Behavior/Discharge Planning: Goal: Ability to manage health-related needs will improve Outcome: Progressing   Problem: Clinical Measurements: Goal: Ability to maintain clinical measurements within normal limits will improve Outcome: Progressing Goal: Will remain free from infection Outcome: Progressing Goal: Diagnostic test results will improve Outcome: Progressing Goal: Respiratory complications will improve Outcome: Progressing Goal: Cardiovascular complication will be avoided Outcome: Progressing   Problem: Activity: Goal: Risk for activity intolerance will decrease Outcome: Progressing   Problem: Nutrition: Goal: Adequate nutrition will be maintained Outcome: Progressing   Problem: Coping: Goal: Level of anxiety will decrease Outcome: Progressing   Problem: Elimination: Goal: Will not experience complications related to bowel motility Outcome: Progressing Goal: Will not experience complications related to urinary retention Outcome: Progressing   Problem: Pain Managment: Goal: General experience of comfort will improve Outcome: Progressing   Problem: Safety: Goal: Ability to remain free from injury will improve Outcome: Progressing   Problem: Skin Integrity: Goal: Risk for impaired skin integrity will decrease Outcome: Progressing   Problem: Education: Goal: Ability to demonstrate management of disease process will improve Outcome: Progressing Goal: Ability to verbalize understanding of medication therapies will improve Outcome: Progressing Goal: Individualized Educational Video(s) Outcome: Progressing   Problem: Activity: Goal: Capacity to carry out activities will improve Outcome: Progressing   Problem: Cardiac: Goal:  Ability to achieve and maintain adequate cardiopulmonary perfusion will improve Outcome: Progressing   Problem: Education: Goal: Knowledge of disease or condition will improve Outcome: Progressing Goal: Knowledge of the prescribed therapeutic regimen will improve Outcome: Progressing Goal: Individualized Educational Video(s) Outcome: Progressing   Problem: Activity: Goal: Ability to tolerate increased activity will improve Outcome: Progressing Goal: Will verbalize the importance of balancing activity with adequate rest periods Outcome: Progressing   Problem: Respiratory: Goal: Ability to maintain a clear airway will improve Outcome: Progressing Goal: Levels of oxygenation will improve Outcome: Progressing Goal: Ability to maintain adequate ventilation will improve Outcome: Progressing   

## 2021-09-09 NOTE — Progress Notes (Signed)
Patient is currently SOB. She is on 8L of O2 [place]. We are doing a budesonide breathing treatment. Lung sounds diminished and wet cough. Cough is productive with small traces of blood. She is at 95% O2 Sat. She is getting one unit of blood at159m/hr. No pain but nausea mentioned. She is placed on a non-rebreather at 10L of O2.  Patient throws up and Oxygen saturation drops to 89%. NP informed.  KNeomia Glass NP orders Zofran and Lasix. Administered as ordered. Chest xray performed.  Blood transfusion completed at 8:20 pm.

## 2021-09-09 NOTE — Evaluation (Signed)
Occupational Therapy Evaluation Patient Details Name: Kerri Carter MRN: [id number] DOB: 02-03-1940 Today's Date: 09/09/2021   History of Present Illness Pt. is 81 y.o. female who was admitted to [facility] with a Right Closed Nondisplaced 5th Toe Fracture, Chronic Respiratory Failure, Diastolic Dysfunction, COPD,  CHF, Stage IIIB CKD, Depression, HTN, GERD, Interstitial Lung Disease, PAD, and Bipolar Disorder.   Clinical Impression   Pt. Presents with weakness, decreased activity tolerance, and limited functional mobility which hinders her ability to complete ADL, and  IADL tasks. Pt. Resides at home with her grandson and his family. Pt. Was receiving HHOT 1x a week, PT services 2x's a week, and an aide 1x a week following multiple recent hospital admissions.  Pt. Required assist with ADLs, and IADL tasks from from her family. Pt.'s family assists with meal preparation, home management tasks.  Pt. Reports being able to complete light meal prep warming items up in the microwave. Pt. Reports receiving meals from [facility], that she warms up in the microwave. Pt. Reports being on 3L of O2 at home.  Upon arrival, Pt. Reports having had an episode of SOB this morning requiring nursing to adjust her O2 to 5L. Pt.'s SO2 on 5LO2 is 94%, with HR  94 bpms. Pt. Education was provided about PLB techniques, with cues pt. Was able to demonstrate the proper technique. Pt. Required multiple cues to initiate PLB during the session. Pt. education was provided verbally about A/E use for LE ADLs, and energy conservation/work simplification techniques. Pt. will benefit from OT services for ADL training, A/E training, and pt. Education about PLB, energy conservation, and work simplification techniques, home modification, and DME.      Recommendations for follow up therapy are one component of a multi-disciplinary discharge planning process, led by the attending physician.  Recommendations may be updated based on patient status,  additional functional criteria and insurance authorization.   Follow Up Recommendations  Home health OT    Assistance Recommended at Discharge    Patient can return home with the following A little help with bathing/dressing/bathroom;Assistance with cooking/housework;Assist for transportation;Direct supervision/assist for financial management;Direct supervision/assist for medications management    Functional Status Assessment  Patient has had a recent decline in their functional status and demonstrates the ability to make significant improvements in function in a reasonable and predictable amount of time.  Equipment Recommendations  None recommended by OT    Recommendations for Other Services       Precautions / Restrictions Precautions Precautions: Fall;Back Type of Shoulder Precautions: Right foot surgical shoe when up. Precaution Comments: hx of lumbar fusion 05/23/21 Restrictions Weight Bearing Restrictions: Yes RLE Weight Bearing: Weight bearing as tolerated Other Position/Activity Restrictions: Right WBAT with the surgical shoe      Mobility Bed Mobility               General bed mobility comments: MinA to reposition is Bed    Transfers                   General transfer comment: No surgical shoe available. Pt. seen for Bed level assessment.      Balance                                           ADL either performed or assessed with clinical judgement   ADL Overall ADL's : Needs assistance/impaired Eating/Feeding: Independent;Set up;Bed level  Grooming: Minimal assistance;Set up;Bed level                                       Vision Patient Visual Report: No change from baseline       Perception     Praxis      Pertinent Vitals/Pain Pain Assessment Pain Assessment: No/denies pain     Hand Dominance Right   Extremity/Trunk Assessment Upper Extremity Assessment Upper Extremity Assessment: Overall  WFL for tasks assessed;LUE deficits/detail RUE Deficits / Details: Left shoulder limitations from due to  a history of a failed ORIF.           Communication Communication Communication: No difficulties   Cognition Arousal/Alertness: Awake/alert Behavior During Therapy: WFL for tasks assessed/performed, Anxious Overall Cognitive Status: Within Functional Limits for tasks assessed                                       General Comments       Exercises     Shoulder Instructions      Home Living Family/patient expects to be discharged to:: Private residence Living Arrangements: Other relatives (Grandson, and family) Available Help at Discharge: Family;Available PRN/intermittently Type of Home: House Home Access: Ramped entrance     Home Layout: One level     Bathroom Shower/Tub: Occupational psychologist: Handicapped height     Home Equipment: Rollator (4 wheels);BSC/3in1;Shower seat;Toilet riser;Grab bars - toilet;Hand held shower head;Grab bars - tub/shower;Adaptive equipment;Transport chair;Other (comment) Adaptive Equipment: Reacher        Prior Functioning/Environment                 ADLs Comments: Receives meals for [employer] Family assist with meal preparation. pt. reports being able to heat up meals in the microwave. Independent with basic self-crae tasks, uses A/E for LE ADLs. [facility], PT, and an aide.        OT Problem List: Decreased activity tolerance;Cardiopulmonary status limiting activity;Impaired balance (sitting and/or standing)      OT Treatment/Interventions: Self-care/ADL training;Energy conservation;Balance training;Therapeutic exercise;Therapeutic activities;Patient/family education    OT Goals(Current goals can be found in the care plan section) Acute Rehab OT Goals Patient Stated Goal: To feel better OT Goal Formulation: With patient Time For Goal Achievement: 09/27/21 Potential to Achieve Goals: Good   OT Frequency: Min 2X/week    Co-evaluation              AM-PAC OT "6 Clicks" Daily Activity     Outcome Measure Help from another person eating meals?: None Help from another person taking care of personal grooming?: A Little Help from another person toileting, which includes using toliet, bedpan, or urinal?: A Lot Help from another person bathing (including washing, rinsing, drying)?: A Lot Help from another person to put on and taking off regular upper body clothing?: A Little Help from another person to put on and taking off regular lower body clothing?: A Little 6 Click Score: 17   End of Session Equipment Utilized During Treatment: Rolling walker (2 wheels)  Activity Tolerance: Patient tolerated treatment well Patient left: with call bell/phone within reach;with nursing/sitter in room;in bed  OT Visit Diagnosis: Unsteadiness on feet (R26.81);Muscle weakness (generalized) (M62.81)                Time: 2951-8841  OT Time Calculation (min): 23 min Charges:  OT General Charges $OT Visit: 1 Visit OT Evaluation $OT Eval Low Complexity: 1 Low  Harrel Carina, MS, OTR/L }  Harrel Carina 09/09/2021, 12:06 PM

## 2021-09-09 NOTE — Progress Notes (Signed)
Nausea stopped. Orders placed for BiPAP

## 2021-09-09 NOTE — Progress Notes (Signed)
PROGRESS NOTE    Kerri Carter  NWG:[id number] DOB: 12-26-1940 DOA: 09/08/2021 PCP: McLean-Scocuzza, Nino Glow, MD  [id number]  LOS: 1 day   Brief hospital course: No notes on file  Assessment & Plan: Kerri Carter is a 81 y.o. female with medical history significant for COPD with chronic respiratory failure on 3 L of oxygen, history of chronic diastolic dysfunction CHF, stage IIIb chronic kidney disease, depression, hypertension, GERD, interstitial lung disease, peripheral arterial disease, bipolar disorder who presented to the ER via EMS for evaluation of worsening shortness of breath. Patient was recently hospitalized from 07/19 - 07/21 and was discharged home on a steroid taper.  Prior to the hospitalization she was admitted from 06/20 - 06/22 for community-acquired pneumonia.   * Acute on chronic respiratory failure (HCC) Secondary to acute COPD and CHF exacerbation. At baseline patient wears 3 L of oxygen but on at 3 L she had pulse oximetry of 84% and was placed on a CPAP by EMS --current on 5L --Continue supplemental O2 to keep sats between 88-92%, wean as tolerated   Acute on chronic diastolic CHF (congestive heart failure) ([place]) Patient presents for evaluation of worsening shortness of breath from her baseline and was hypoxic upon presentation. BNP is elevated and chest x-ray shows small bilateral pleural effusions. Last known LVEF is 55 to 60% from a 2D cardiogram which was done 06/23 --started on Lasix 40 mg IV daily on presentation Plan: --cont IV lasix 40 mg daily --monitor Cr while diuresing  Recurrent pneumonia --this is 5th admission since April 2023.  Follows with Dr. Patsey Berthold as outpatient. --current CT scan of the chest without contrast shows  interval development of patchy ground-glass airspace disease in the right upper lobe and posterior left upper lobe.  Interval development of consolidative airspace disease in both lower lobes which may be in part related to  atelectasis although consolidative pneumonia, notably in the right lower lobe, is suspected. --started on Unasyn on presentation for concern for possible aspiration pneumonia Plan: --cont Unasyn for now --Modified Barium Swallow Study per SLP rec --consider pulm consult with Dr. Patsey Berthold tomorrow   COPD exacerbation [facility]) --pt was just recently treated for COPD exacerbation with steroid and discharged on 08/22/21. --was not started on steroid on current admission Plan: --cont Pulmicort neb BID --consider pulm consult with Dr. Patsey Berthold tomorrow  Paroxysmal atrial fibrillation ([place]) Stable Continue Eliquis as primary prophylaxis for an acute stroke Continue Bystolic for rate control  Physical deconditioning Secondary to frequent hospitalizations as well as chronic illness Has frequent falls at home and gets home PT --PT/OT  Rheumatoid arthritis ([place]) Stable Continue Arava  Mixed bipolar I disorder (HCC) Stable Continue BuSpar, Lexapro and Seroquel  Stage 3b chronic kidney disease ([place]) Renal function appears stable Monitor closely while on diuretic therapy Follow-up with nephrology as an outpatient  Gastroesophageal reflux disease Stable Continue PPI  Toe fracture, right Status post recent fall with complaints of pain in her right hip and foot. Right foot x-ray shows acute minimally impacted fracture at the base of the fifth proximal phalanx. Podiatry consulted  --weightbearing as tolerated in surgical shoe     DVT prophylaxis: VH:QIONGEX Code Status: DNR  Family Communication:  Level of care: Med-Surg Dispo:   The patient is from: home Anticipated d/c is to: home Anticipated d/c date is: 2-3 days   Subjective and Interval History:  Pt reported dyspnea improved since presentation.     Objective: Vitals:   09/09/21 1138 09/09/21 1516 09/09/21  1720 09/09/21 1744  BP: (!) 127/58 112/62 139/62 (!) 169/84  Pulse: 91 92 (!) 101 (!) 106  Resp: 20 14 (!) 22  (!) 22  Temp: (!) 97.5 F (36.4 C) 98.3 F (36.8 C) 98 F (36.7 C) 97.9 F (36.6 C)  TempSrc: Oral Oral Oral Oral  SpO2: 96% 96% 91% 94%  Weight:      Height:        Intake/Output Summary (Last 24 hours) at 09/09/2021 1912 Last data filed at 09/09/2021 1800 Gross per 24 hour  Intake 129.48 ml  Output 1975 ml  Net -1845.52 ml   Filed Weights   09/08/21 0602  Weight: 80.7 kg    Examination:   Constitutional: NAD, AAOx3 HEENT: conjunctivae and lids normal, EOMI CV: No cyanosis.   RESP: increased RR, rhonchi and wheezes over right lung, on 5L Neuro: II - XII grossly intact.   Psych: Normal mood and affect.  Appropriate judgement and reason   Data Reviewed: I have personally reviewed labs and imaging studies  Time spent: 50 minutes  Enzo Bi, MD [facility] If 7PM-7AM, please contact night-coverage 09/09/2021, 7:12 PM

## 2021-09-09 NOTE — Progress Notes (Signed)
Pt has received lasix and zofran and is improving, nausea has decreased. Patient is not coughinig as much as before. Complains of occasional neck pain for which oxy and tylenol were administered at change of shift by previous nurse.   Will monitor patient and place on Bipap as patient can tolerate.

## 2021-09-09 NOTE — Evaluation (Addendum)
Physical Therapy Evaluation Patient Details Name: Kerri Carter MRN: [id number] DOB: 17-Feb-1940 Today's Date: 09/09/2021  History of Present Illness  Pt. is 81 y.o. female who was admitted to [facility] with a Right Closed Nondisplaced 5th Toe Fracture, Chronic Respiratory Failure, Diastolic Dysfunction, COPD,  CHF, Stage IIIB CKD, Depression, HTN, GERD, Interstitial Lung Disease, PAD, and Bipolar Disorder.  Clinical Impression  Pt is a pleasant 81 year old female who was admitted for ARF. Pt performs bed mobility/transfers with cga/min assist with donning surgical shoe. Pt with low Hgb at this time and declined to ambulate. Pt also becomes very SOB with minimal exertion needing frequent rest breaks with exertion. Pt demonstrates deficits with endurance/mobility. Pt is frequent falls history with acute 5th toe acute fx, educated to use during weightbearing. All mobility performed on 5L of O2 with sats at 94%. Would benefit from skilled PT to address above deficits and promote optimal return to PLOF. Recommend transition to [place] upon discharge from acute hospitalization.      Recommendations for follow up therapy are one component of a multi-disciplinary discharge planning process, led by the attending physician.  Recommendations may be updated based on patient status, additional functional criteria and insurance authorization.  Follow Up Recommendations Home health PT Can patient physically be transported by private vehicle: Yes    Assistance Recommended at Discharge Set up Supervision/Assistance  Patient can return home with the following  A little help with walking and/or transfers;A little help with bathing/dressing/bathroom;Help with stairs or ramp for entrance    Equipment Recommendations Wheelchair (measurements PT)  Recommendations for Other Services       Functional Status Assessment Patient has had a recent decline in their functional status and demonstrates the ability to make  significant improvements in function in a reasonable and predictable amount of time.     Precautions / Restrictions Precautions Precautions: Fall Type of Shoulder Precautions: Right foot surgical shoe when up. Precaution Comments: hx of lumbar fusion 05/23/21 Restrictions Weight Bearing Restrictions: Yes RLE Weight Bearing: Weight bearing as tolerated Other Position/Activity Restrictions: Right WBAT with the surgical shoe      Mobility  Bed Mobility Overal bed mobility: Needs Assistance Bed Mobility: Supine to Sit, Sit to Supine     Supine to sit: Min guard Sit to supine: Min assist   General bed mobility comments: needs assist for B LEs. Once seated at EOB, needs frequent rest breaks due to SOB symptoms with limited exertion.    Transfers Overall transfer level: Needs assistance Equipment used: 1 person hand held assist Transfers: Sit to/from Stand, Bed to chair/wheelchair/BSC Sit to Stand: Min guard   Step pivot transfers: Min assist       General transfer comment: Surgical shoe donned with HHA. Pt able to stand with upright posture. Slight unsteadiness noted with WBing on single UE. Able to step pivot to/from recliner due to soiled bed.    Ambulation/Gait               General Gait Details: deferred due to low Hgb and pending blood transfusion  Stairs            Wheelchair Mobility    Modified Rankin (Stroke Patients Only)       Balance Overall balance assessment: Needs assistance Sitting-balance support: No upper extremity supported, Feet supported Sitting balance-Leahy Scale: Good     Standing balance support: Single extremity supported Standing balance-Leahy Scale: Fair  Pertinent Vitals/Pain Pain Assessment Pain Assessment: No/denies pain    Home Living Family/patient expects to be discharged to:: Private residence Living Arrangements: Other relatives (grandson and family, however they work  during the day and are away from the home) Available Help at Discharge: Family;Available PRN/intermittently Type of Home: House Home Access: Ramped entrance       Home Layout: One level Home Equipment: Rollator (4 wheels);BSC/3in1;Shower seat;Toilet riser;Grab bars - toilet;Hand held shower head;Grab bars - tub/shower;Adaptive equipment;Transport chair;Other (comment)      Prior Function Prior Level of Function : Needs assist;History of Falls (last six months)             Mobility Comments: pt reports she is limited household ambulator using RW and chronic O2. Reports multiple falls history and is currently active with HHPT ADLs Comments: Receives meals for [employer] Family assist with meal preparation. pt. reports being able to heat up meals in the microwave. Independent with basic self-crae tasks, uses A/E for LE ADLs. [facility], PT, and an aide.     Hand Dominance   Dominant Hand: Right    Extremity/Trunk Assessment   Upper Extremity Assessment Upper Extremity Assessment: Overall WFL for tasks assessed RUE Deficits / Details: Left shoulder limitations from due to  a history of a failed ORIF.    Lower Extremity Assessment Lower Extremity Assessment: Generalized weakness (B LE grossly 4/5)       Communication   Communication: No difficulties  Cognition Arousal/Alertness: Awake/alert Behavior During Therapy: WFL for tasks assessed/performed, Anxious Overall Cognitive Status: Within Functional Limits for tasks assessed                                 General Comments: pleasant and agreeable to session        General Comments      Exercises Other Exercises Other Exercises: reviewed role of PT and assisted with hygiene including bed/gown change due to soiled in bed. Notified RN. Returned pt to bed as going down for MBS with SLP   Assessment/Plan    PT Assessment Patient needs continued PT services  PT Problem List Decreased strength;Decreased  activity tolerance;Decreased range of motion;Decreased balance;Decreased mobility;Decreased knowledge of use of DME;Decreased safety awareness;Cardiopulmonary status limiting activity       PT Treatment Interventions DME instruction;Gait training;Stair training;Functional mobility training;Therapeutic activities;Therapeutic exercise;Balance training;Patient/family education    PT Goals (Current goals can be found in the Care Plan section)  Acute Rehab PT Goals Patient Stated Goal: to return home PT Goal Formulation: With patient Time For Goal Achievement: 09/23/21 Potential to Achieve Goals: Good    Frequency Min 2X/week     Co-evaluation               AM-PAC PT "6 Clicks" Mobility  Outcome Measure Help needed turning from your back to your side while in a flat bed without using bedrails?: None Help needed moving from lying on your back to sitting on the side of a flat bed without using bedrails?: A Little Help needed moving to and from a bed to a chair (including a wheelchair)?: A Little Help needed standing up from a chair using your arms (e.g., wheelchair or bedside chair)?: A Little Help needed to walk in hospital room?: A Lot Help needed climbing 3-5 steps with a railing? : A Lot 6 Click Score: 17    End of Session Equipment Utilized During Treatment: Gait belt Activity Tolerance: Patient  tolerated treatment well Patient left: in bed;with bed alarm set Nurse Communication: Mobility status PT Visit Diagnosis: Difficulty in walking, not elsewhere classified (R26.2);Muscle weakness (generalized) (M62.81)    Time: 1798-1025 PT Time Calculation (min) (ACUTE ONLY): 26 min   Charges:   PT Evaluation $PT Eval Low Complexity: 1 Low PT Treatments $Therapeutic Activity: 8-22 mins        Greggory Stallion, PT, DPT, GCS [phone]   Vonn Sliger 09/09/2021, 2:58 PM

## 2021-09-09 NOTE — Progress Notes (Signed)
Modified Barium Swallow Progress Note  Patient Details  Name: KAILAN CARMEN MRN: [id number] Date of Birth: [birth year]  Today's Date: 09/09/2021  Modified Barium Swallow completed.  Full report located under Chart Review in the Imaging Section.  Brief recommendations include the following:  Clinical Impression  Pt presents with adequate oropharyngeal abilities when consuming thin liquids via cup and straw; puree and whole barium tablet with thin liquids via straw. Pt did become "winded" when consuming 4 consecutive sips of thin liquids via straw. Pt states understanding of pacing herself when consuming POs. At this time, current diet appears appropriate. No further skilled ST intervention is indicated.   Swallow Evaluation Recommendations       SLP Diet Recommendations: Regular solids;Thin liquid   Liquid Administration via: Straw;Cup   Medication Administration: Whole meds with liquid   Supervision: Patient able to self feed   Compensations: Minimize environmental distractions;Slow rate;Small sips/bites   Postural Changes: Seated upright at 90 degrees   Oral Care Recommendations: Oral care BID      Maycen Degregory B. Rutherford Nail, M.S., CCC-SLP, Mining engineer Certified Brain Injury [place]  [place] Office [phone] Ascom [phone] Fax [phone]

## 2021-09-09 NOTE — Progress Notes (Signed)
       CROSS COVER NOTE  NAME: Kerri Carter MRN: [id number] DOB : 04-05-1940   Date of Service   09/09/21  HPI/Events of Note   Rapid Response called on [name] for respiratory distress. On arrival to bedside [name] has increased work of breathing with accessory muscle use and pursed lip breathing. She has 1U PRBCs currently infusing. On auscultation she has crackles and rhonchi throughout. Current SPO2 96% on 6L and RR 32.  Interventions   Plan:  CXR--> shows (R) > (L) pneumonia and pulmonary edema  Acute on chronic Diastolic Heart Failure 40 mg IV Lasix now  COPD Exacerbation secondary to recurrent pneumonia  Methylprednisolone '125mg'$  IV Bipap, wean as able Continue Unasyn, bronchodilators   Oxycodone for 7/10 neck and shoulder pain Zofran for emesis    This document was prepared using Dragon voice recognition software and may include unintentional dictation errors.  Neomia Glass DNP, MHA, FNP-BC Nurse Practitioner [facility] Pager [phone]

## 2021-09-09 NOTE — Progress Notes (Signed)
Rapid Response Event Note   Reason for Call : shob   Initial Focused Assessment: On my arrival pt is lying in visually shob. She is receiving breathing treatment. Primary RN states she had to increase her O2 to 8L on [place]. Pt is receiving unit PRBCs at this time. Pt has a wet cough. Alert and oriented. Pt says she is really struggling to breathe. Pt became very tachypnic and began to vomit. NP at bedside.    Interventions: Neomia Glass, NP notified and at bedside. Chest xray ordered and performed. IV Zofran and 40 mg IV lasix given per NP order. RT at bedside and placed pt on 6L [place]. Pt's VS stable at this time. After about 10 minutes, pt states that she is starting to feel somewhat better. Pt's family member at bedside.   Plan of Care: Pt will remain on 2A at this time per Valetta Fuller, NP. Pt will most likely require bipap once nausea resolves. This RN will follow up with pt and primary RN.     Event Summary:   MD Notified: Neomia Glass, NP Call Time: 2003 Arrival Time: 2003 End Time: 2030  Trellis Paganini, RN

## 2021-09-09 NOTE — Progress Notes (Addendum)
   09/09/21 2020  Clinical Encounter Type  Visited With Patient and family together  Visit Type Initial  Referral From Other (Comment) (rapid response)   Chaplain Eaden Hettinger attended pt at bedside following a rapid response. This chaplain was remembered by the patient when we signed her HCPOA. Pt's son, Marlou Sa, also present.  Chaplain Bolton Canupp offered compassionate, non-anxious presence. Pt expressed her ongoing struggle with breathing and pain and how this relates to a desire to accept god's will. Chaplain B encouraged pt to keep taking good slow, steady breaths but also acknowledged pt's fatigue with her condition. Chaplain B supported pt in how she makes meaning of her illness and affirmed pt's faith; offered support through prayer which was welcomed.  Chaplain B offered hospitality (water) because pt expressed having a lot of dryness of mouth and her anticipatioin of switching to bipap mask and being less able to drink.  Chaplain B checked-in on well-being of son and offered support to him as well. This was also a good time for checking in with his mother desires for treatment and they seem to have good communication (bipap but not vent, etc).

## 2021-09-09 NOTE — Progress Notes (Signed)
PHARMACY NOTE:  ANTIMICROBIAL RENAL DOSAGE ADJUSTMENT  Current antimicrobial regimen includes a mismatch between antimicrobial dosage and estimated renal function.  As per policy approved by the Pharmacy & Therapeutics and Medical Executive Committees, the antimicrobial dosage will be adjusted accordingly.  Current antimicrobial dosage:  Unasyn 3gm q 12 hours  Indication: PNA  Renal Function:  Estimated Creatinine Clearance: 32.1 mL/min (A) (by C-G formula based on SCr of 1.39 mg/dL (H)).     Antimicrobial dosage has been changed to:  Unasyn '3mg'$  q 8hrs  Additional comments:   Thank you for allowing pharmacy to be a part of this patient's care.  Atticus Lemberger Rodriguez-Guzman PharmD, BCPS 09/09/2021 3:24 PM

## 2021-09-09 NOTE — Assessment & Plan Note (Signed)
Status post recent fall with complaints of pain in her right hip and foot. Right foot x-ray shows acute minimally impacted fracture at the base of the fifth proximal phalanx. Podiatry consulted  --weightbearing as tolerated in surgical shoe

## 2021-09-09 NOTE — Progress Notes (Signed)
       CROSS COVER NOTE  NAME: Kerri Carter MRN: [id number] DOB : 28-Aug-1940    Date of Service   09/09/21  HPI/Events of Note   Notified of IV infiltration shortly after completion of Unasyn. Nursing reports patient received entire dose and is experiencing pruritus localized to the IV site.  Interventions   Plan: Benadryl x1     This document was prepared using Dragon voice recognition software and may include unintentional dictation errors.  Neomia Glass DNP, MHA, FNP-BC Nurse Practitioner [facility] Pager [phone]

## 2021-09-10 DIAGNOSIS — J9621 Acute and chronic respiratory failure with hypoxia: Secondary | ICD-10-CM

## 2021-09-10 DIAGNOSIS — J9622 Acute and chronic respiratory failure with hypercapnia: Secondary | ICD-10-CM

## 2021-09-10 DIAGNOSIS — J441 Chronic obstructive pulmonary disease with (acute) exacerbation: Secondary | ICD-10-CM | POA: Diagnosis not present

## 2021-09-10 HISTORY — DX: Acute and chronic respiratory failure with hypoxia: J96.21

## 2021-09-10 LAB — BASIC METABOLIC PANEL
Anion gap: 11 (ref 5–15)
BUN: 31 mg/dL — ABNORMAL HIGH (ref 8–23)
CO2: 27 mmol/L (ref 22–32)
Calcium: 7.8 mg/dL — ABNORMAL LOW (ref 8.9–10.3)
Chloride: 101 mmol/L (ref 98–111)
Creatinine, Ser: 1.44 mg/dL — ABNORMAL HIGH (ref 0.44–1.00)
GFR, Estimated: 37 mL/min — ABNORMAL LOW (ref >60)
Glucose, Bld: 208 mg/dL — ABNORMAL HIGH (ref 70–99)
Potassium: 4.2 mmol/L (ref 3.5–5.1)
Sodium: 139 mmol/L (ref 135–145)

## 2021-09-10 LAB — RESPIRATORY PANEL BY PCR

## 2021-09-10 LAB — BPAM RBC
Blood Product Expiration Date: 202308102359
ISSUE DATE / TIME: 202308081705
Unit Type and Rh: 600

## 2021-09-10 LAB — CBC
HCT: 29.5 % — ABNORMAL LOW (ref 36.0–46.0)
Hemoglobin: 9.4 g/dL — ABNORMAL LOW (ref 12.0–15.0)
MCH: 24.7 pg — ABNORMAL LOW (ref 26.0–34.0)
MCHC: 31.9 g/dL (ref 30.0–36.0)
MCV: 77.4 fL — ABNORMAL LOW (ref 80.0–100.0)
Platelets: 287 10*3/uL (ref 150–400)
RBC: 3.81 MIL/uL — ABNORMAL LOW (ref 3.87–5.11)
RDW: 17.9 % — ABNORMAL HIGH (ref 11.5–15.5)
WBC: 17.1 10*3/uL — ABNORMAL HIGH (ref 4.0–10.5)
nRBC: 0 % (ref 0.0–0.2)

## 2021-09-10 LAB — TYPE AND SCREEN
ABO/RH(D): AB POS
Antibody Screen: NEGATIVE
Unit division: 0

## 2021-09-10 LAB — EXPECTORATED SPUTUM ASSESSMENT W GRAM STAIN, RFLX TO RESP C

## 2021-09-10 LAB — MAGNESIUM: Magnesium: 2.1 mg/dL (ref 1.7–2.4)

## 2021-09-10 MED ORDER — STERILE WATER FOR INJECTION IJ SOLN
INTRAMUSCULAR | Status: AC
  Administered 2021-09-10: 10 mL
  Filled 2021-09-10: qty 10

## 2021-09-10 MED ORDER — IPRATROPIUM-ALBUTEROL 0.5-2.5 (3) MG/3ML IN SOLN: 3.0000 mL | Freq: Four times a day (QID) | RESPIRATORY_TRACT | Status: DC

## 2021-09-10 MED ORDER — IPRATROPIUM-ALBUTEROL 0.5-2.5 (3) MG/3ML IN SOLN
3.0000 mL | Freq: Two times a day (BID) | RESPIRATORY_TRACT | Status: DC
  Administered 2021-09-10 – 2021-09-12 (×4): 3 mL via RESPIRATORY_TRACT
  Filled 2021-09-10 (×4): qty 3

## 2021-09-10 MED ORDER — METHYLPREDNISOLONE SODIUM SUCC 40 MG IJ SOLR
40.0000 mg | Freq: Every day | INTRAMUSCULAR | Status: DC
  Administered 2021-09-10 – 2021-09-12 (×3): 40 mg via INTRAVENOUS
  Filled 2021-09-10 (×3): qty 1

## 2021-09-10 MED ORDER — FUROSEMIDE 10 MG/ML IJ SOLN
40.0000 mg | Freq: Two times a day (BID) | INTRAMUSCULAR | Status: DC
  Administered 2021-09-10: 40 mg via INTRAVENOUS
  Filled 2021-09-10 (×2): qty 4

## 2021-09-10 MED ORDER — IPRATROPIUM-ALBUTEROL 0.5-2.5 (3) MG/3ML IN SOLN: 3.0000 mL | Freq: Four times a day (QID) | RESPIRATORY_TRACT | Status: DC | PRN

## 2021-09-10 MED ORDER — ALBUTEROL SULFATE (2.5 MG/3ML) 0.083% IN NEBU
2.5000 mg | INHALATION_SOLUTION | RESPIRATORY_TRACT | Status: DC | PRN
Start: 2021-09-10 — End: 2021-09-12

## 2021-09-10 NOTE — Hospital Course (Signed)
Kerri Carter is a 81 y.o. female with medical history significant for COPD with chronic respiratory failure on 3 L of oxygen, history of chronic diastolic dysfunction CHF, stage IIIb chronic kidney disease, depression, hypertension, GERD, interstitial lung disease, peripheral arterial disease, bipolar disorder who presented to the ER via EMS for evaluation of worsening shortness of breath. Patient was recently hospitalized from 07/19 - 07/21 and was discharged home on a steroid taper.  Prior to the hospitalization she was admitted from 06/20 - 06/22 for community-acquired pneumonia.

## 2021-09-10 NOTE — Assessment & Plan Note (Signed)
-   Steroids and antibiotics

## 2021-09-10 NOTE — Progress Notes (Signed)
  Progress Note   Patient: Kerri Carter SNK:[id number] DOB: 29-Sep-1940 DOA: 09/08/2021     2 DOS: the patient was seen and examined on 09/10/2021 at 10:52AM      Brief hospital course: Kerri Carter is a 81 y.o. F with COPD and chronic respiratory failure on PRN O2 at home, dCHF, CKD IIIb, HTN and ILD who presented with shortness of breath.     Assessment and Plan: * COPD with exacerbation ([place]) Still wheezing here. - Continue steroids - Continue Singulair - Continue Pulmicort - Continue Duonebs  Acute on chronic diastolic CHF (congestive heart failure) (HCC) -Furosemide IV -K supplement -Strict I/Os, daily weights, telemetry  -Daily monitoring renal function    Recurrent pneumonia CT chest shows new patchy infiltrates in right upper lobe and left upper lobe as well as right lower lobe - Continue Unasyn - Continue Mucinex, pulmonary toilet  Paroxysmal atrial fibrillation (HCC) Rate normal - Continue BB and Eliquis  Essential hypertension BP normal - Continue amlodipine, nebivolol  Physical deconditioning    Microcytic anemia Transfused 1 unit yesterday. - Check iron studies, retics  Rheumatoid arthritis ([place]) - Continue leflunomide  Mixed bipolar I disorder (HCC) - Continue BuSpar, Lexapro and Seroquel, Lamictal  Stage 3b chronic kidney disease (HCC)    Gastroesophageal reflux disease - Continue PPI  Acute on chronic respiratory failure with hypoxia (HCC) Overnight patient with respiratory distress, required BiPAP and then weaned to 5L this afternoon.  Due to CHF and COPD in setting of ILD.  Toe fracture, right Status post recent fall with complaints of pain in her right hip and foot. Right foot x-ray shows acute minimally impacted fracture at the base of the fifth proximal phalanx. Podiatry consulted  --weightbearing as tolerated in surgical shoe   PAD (peripheral artery disease) (HCC) - Continue Eliquis, pravatstatin  Interstitial pulmonary  disease ([place]) - Steroids and antibiotics          Subjective: Patient very dyspneic overnight with blood transfusion, this improved with Lasix and BiPAP.  This morning she is stable on supplemental oxygen 5 L.     Physical Exam: Vitals:   09/10/21 0800 09/10/21 0801 09/10/21 1132 09/10/21 1513  BP:   135/64 (!) 121/54  Pulse:  87 98 82  Resp:  '17 16 16  '$ Temp:   97.8 F (36.6 C) 97.9 F (36.6 C)  TempSrc:   Oral Oral  SpO2: 98% 98% 96% 97%  Weight:      Height:       Thin elderly female, lying in bed, no acute distress RRR, no murmurs, no peripheral edema Respiratory rate normal, wheezing bilaterally Abdomen soft there is palpation or guarding Attention normal, affect normal, judgment insight appear normal   Data Reviewed: Respiratory virus panel negative, basic metabolic panel unremarkable Lactate 3.8, white blood cell count normal Procalcitonin low  Family Communication: Son by phone    Disposition: Status is: Inpatient         Author: Edwin Dada, MD 09/10/2021 5:16 PM  For on call review [email or web address].

## 2021-09-10 NOTE — Assessment & Plan Note (Signed)
-   Continue Eliquis, pravatstatin

## 2021-09-10 NOTE — Assessment & Plan Note (Signed)
BP normal - Continue amlodipine, nebivolol

## 2021-09-10 NOTE — TOC Initial Note (Signed)
Transition of Care [facility]) - Initial/Assessment Note    Patient Details  Name: Kerri Carter MRN: [id number] Date of Birth: 1940-03-23  [facility]) CM/SW Contact:    Alberteen Sam, LCSW Phone Number: 09/10/2021, 10:11 AM  Clinical Narrative:                  CSW spoke with patient regarding home health recommendations, she reports being in agreement with [facility] and requests Maudie Mercury for PT, Missy for OT and Quita Skye for RN, CSW has relayed information to [place] with [facility] who reports patient can resume and will need [place] orders for PT OT and aide. She requests a wheel chair, csw has requested orders from MD and then will send to [employer] to follow up with Wheel Chair.   At time of discharge patient will have grandson transport home.   Expected Discharge Plan: [place] Barriers to Discharge: Continued Medical Work up   Patient Goals and CMS Choice Patient states their goals for this hospitalization and ongoing recovery are:: to go home CMS Medicare.gov Compare Post Acute Care list provided to:: Patient Choice offered to / list presented to : Patient  Expected Discharge Plan and Services Expected Discharge Plan: [place]       Living arrangements for the past 2 months: Single Family Home                           HH Arranged: PT, OT, Nurse's Aide HH Agency: [place] Date [place]: 09/10/21 Time [place] Agency Contacted: 1011 Representative spoke with at [place]: [place]  Prior Living Arrangements/Services Living arrangements for the past 2 months: [place] with:: Relatives   Do you feel safe going back to the place where you live?: Yes               Activities of Daily Living Home Assistive Devices/Equipment: Environmental consultant (specify type), Eyeglasses ADL Screening (condition at time of admission) Patient's cognitive ability adequate to safely complete daily activities?: Yes Is the  patient deaf or have difficulty hearing?: No Does the patient have difficulty seeing, even when wearing glasses/contacts?: No Does the patient have difficulty concentrating, remembering, or making decisions?: Yes Patient able to express need for assistance with ADLs?: Yes Does the patient have difficulty dressing or bathing?: Yes Independently performs ADLs?: No Communication: Independent Dressing (OT): Needs assistance Is this a change from baseline?: Pre-admission baseline Grooming: Independent Feeding: Independent Bathing: Needs assistance Is this a change from baseline?: Change from baseline, expected to last <3 days Toileting: Needs assistance Is this a change from baseline?: Change from baseline, expected to last <3 days In/Out Bed: Needs assistance Is this a change from baseline?: Pre-admission baseline Walks in Home: Independent with device (comment) Does the patient have difficulty walking or climbing stairs?: Yes Weakness of Legs: Both Weakness of Arms/Hands: None  Permission Sought/Granted                  Emotional Assessment       Orientation: : Oriented to Self, Oriented to Place, Oriented to  Time, Oriented to Situation Alcohol / Substance Use: Not Applicable Psych Involvement: No (comment)  Admission diagnosis:  Right hip pain [M25.551] COPD exacerbation ([place]) [J44.1] HCAP (healthcare-associated pneumonia) [J18.9] Fall, initial encounter [W19.XXXA] Acute on chronic respiratory failure ([place]) [J96.20] Patient Active Problem List   Diagnosis Date Noted   Toe fracture,  right 09/09/2021   Closed nondisplaced fracture of proximal phalanx of lesser toe of right foot    Recurrent falls 09/01/2021   COPD with exacerbation ([place]) 08/20/2021   Acute on chronic diastolic CHF (congestive heart failure) ([place]) 08/20/2021   Paroxysmal atrial fibrillation ([place]) 07/29/2021   Acute on chronic combined systolic (congestive) and diastolic (congestive) heart failure ([place])  07/28/2021   Stage 3a chronic kidney disease (CKD) ([place]) 07/23/2021   Recurrent pneumonia 07/23/2021   Community acquired pneumonia 07/22/2021   Sepsis due to pneumonia ([place]) 07/22/2021   Lymphocytosis 07/13/2021   Thrombocytosis 06/24/2021   Leukopenia 06/24/2021   Abnormal thyroid function test 06/24/2021   Acute on chronic combined systolic and diastolic CHF (congestive heart failure) ([place]) 05/26/2021   Left lower lobe pneumonia 05/26/2021   Elevated d-dimer 05/26/2021   Acute kidney injury superimposed on chronic kidney disease ([place]) 05/26/2021   Hyperkalemia 05/26/2021   Anxiety and depression 05/26/2021   S/P lumbar fusion 05/23/2021   Physical deconditioning 02/27/2021   Leg pain, bilateral    Respiratory failure with hypoxia ([place]) 01/12/2021   Acute on chronic respiratory failure with hypoxia ([place]) 01/12/2021   Frequent falls 12/18/2020   Hip pain 12/18/2020   Acute pain of left knee 12/18/2020   Elevated brain natriuretic peptide (BNP) level 12/18/2020   Weakness of both lower extremities 12/18/2020   Assistance needed for mobility 12/18/2020   Impaired mobility 12/18/2020   Urinary incontinence 12/18/2020   Pleural effusion on left 12/18/2020   Lumbar radiculopathy 12/11/2020   Acromial process of scapula fracture 12/21/2019   NSIP (nonspecific interstitial pneumonitis) ([place]) 12/08/2019   Iliopsoas bursitis of right hip 08/29/2019   Chronic left shoulder pain 08/29/2019   Overweight (BMI 25.0-29.9) 08/29/2019   Hypertriglyceridemia 07/24/2019   Bipolar disorder, in full remission, most recent episode depressed ([place]) 07/13/2019   Essential hypertension 06/16/2019   Chronic pain 06/16/2019   Lymphedema 06/05/2019   Chronic venous insufficiency 05/25/2019   PAD (peripheral artery disease) ([place]) 05/25/2019   Leg edema 05/02/2019   Episodic mood disorder ([place]) 99/24/2683   Complicated grief 41/96/2229   At high risk for falls 03/16/2019   Pelvic mass in female  03/16/2019   Compression fracture of L4 vertebra ([place]) 02/21/2019   Collapsed vertebra, not elsewhere classified, lumbar region, initial encounter for fracture ([place]) 02/21/2019   Anxiety 01/20/2019   Pain due to onychomycosis of toenails of both feet 12/05/2018   Hav (hallux abducto valgus), unspecified laterality 12/05/2018   Closed fracture of acromial process of scapula 10/28/2018   Status post reverse arthroplasty of shoulder, left 07/26/2018   Interstitial pulmonary disease ([place]) 07/07/2018   Fatigue 07/07/2018   Avascular necrosis of left shoulder due to adverse effect of steroid therapy ([place]) 01/20/2018   Other shoulder lesions, left shoulder 12/20/2017   Status post reverse total shoulder replacement, right 11/04/2017   Leukocytosis 10/19/2017   Allergic rhinitis 09/28/2017   Rotator cuff tendinitis, right 07/26/2017   Strain of right hip 02/26/2017   History of fracture of left hip 01/15/2017   Status post lumbar spinal fusion 01/15/2017   Dislocation of hip joint prosthesis ([place]) 01/08/2017   Status post hip hemiarthroplasty 01/08/2017   Leg swelling 12/15/2016   Age-related osteoporosis without current pathological fracture 11/05/2016   Hip fracture ([place]) 10/31/2016   Peripheral neuropathy 10/28/2016   Left foot pain 10/19/2016   Spinal stenosis of lumbar region 09/15/2016   Osteoporosis 08/27/2016   Drug-induced osteoporosis 08/27/2016   Adnexal mass 08/25/2016  Prediabetes 08/25/2016   Coronary atherosclerosis 08/25/2016   History of syncope 08/25/2016   Hypokalemia 08/25/2016   Mixed bipolar I disorder ([place]) 10/09/2015   Stage 3b chronic kidney disease ([place])    Normocytic anemia 08/15/2015   Conversion disorder 08/04/2015   Hyperlipidemia 07/17/2015   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   Macular degeneration 07/17/2015   Insomnia 07/17/2015   Rheumatoid arthritis ([place]) 12/05/2014   Other specified postprocedural states 07/20/2013    Osteoarthritis of knee 06/07/2013   Cough 05/02/2012   SOB (shortness of breath) 10/13/2011   Valvular heart disease 10/13/2011   Chronic obstructive pulmonary disease (COPD) ([place]) 09/09/2011   OSA (obstructive sleep apnea) 09/09/2011   Nocturnal hypoxemia 09/09/2011   Gastroesophageal reflux disease 02/24/2011   Single kidney 02/24/2011   H/O cardiac catheterization 02/24/2011   Renal artery stenosis ([place]) 02/24/2011   PCP:  McLean-Scocuzza, Nino Glow, MD Pharmacy:   [id number] [place] [facility] [id number]: [phone]: [phone] [id number] [id number]: [phone]: [phone] [id number]: [phone]: [phone]   Social Determinants of Health (SDOH) Interventions    Readmission Risk Interventions    08/21/2021    3:19 PM 05/24/2021    2:35 PM  Readmission Risk Prevention Plan  Transportation Screening Complete Complete  PCP or Specialist Appt within 3-5 Days  Complete  HRI or HoElfridaComplete  Social Work Consult for ReAdamsburglanning/Counseling  Complete  Palliative Care Screening  Not Applicable  Medication Review (RPress photographerComplete Complete  HRI or HoIndianolaomplete   SW Recovery Care/Counseling Consult Complete   Palliative Care Screening Not ApScotlandot Applicable

## 2021-09-10 NOTE — Assessment & Plan Note (Signed)
Overnight patient with respiratory distress, required BiPAP and then weaned to 5L this afternoon.  Due to CHF and COPD in setting of ILD.

## 2021-09-10 NOTE — Hospital Course (Signed)
Kerri Carter is a 81 y.o. F with COPD and chronic respiratory failure on PRN O2 at home, dCHF, CKD IIIb, HTN and ILD who presented with shortness of breath.

## 2021-09-10 NOTE — Assessment & Plan Note (Signed)
Transfused 1 unit yesterday. - Check iron studies, retics

## 2021-09-10 NOTE — Progress Notes (Signed)
Physical Therapy Treatment Patient Details Name: Kerri Carter MRN: [id number] DOB: 02-18-1940 Today's Date: 09/10/2021   History of Present Illness Pt. is 81 y.o. female who was admitted to [facility] with a Right Closed Nondisplaced 5th Toe Fracture, Chronic Respiratory Failure, Diastolic Dysfunction, COPD,  CHF, Stage IIIB CKD, Depression, HTN, GERD, Interstitial Lung Disease, PAD, and Bipolar Disorder.    PT Comments    Pt is making limited progress towards goals and was successfully weaned to 4L of O2 at this time. O2 sats remained at 96% with exertion, however pt fatigues rapidly with exertional efforts and gets coughing fits, non productive. Pt with LOB during standing, requiring PT assist to prevent fall. Post op shoe donned with no pain reported in R foot with mobility. Updating to SNF at this time. Updated care team.  Recommendations for follow up therapy are one component of a multi-disciplinary discharge planning process, led by the attending physician.  Recommendations may be updated based on patient status, additional functional criteria and insurance authorization.  Follow Up Recommendations  Skilled nursing-short term rehab (<3 hours/day) Can patient physically be transported by private vehicle: No   Assistance Recommended at Discharge Intermittent Supervision/Assistance  Patient can return home with the following A lot of help with walking and/or transfers;A lot of help with bathing/dressing/bathroom;Assist for transportation;Help with stairs or ramp for entrance   Equipment Recommendations  Wheelchair (measurements PT)    Recommendations for Other Services       Precautions / Restrictions Precautions Precautions: Fall Type of Shoulder Precautions: Right foot surgical shoe when up. Precaution Comments: hx of lumbar fusion 05/23/21 Restrictions Weight Bearing Restrictions: Yes RLE Weight Bearing: Weight bearing as tolerated Other Position/Activity Restrictions: Right WBAT  with the surgical shoe     Mobility  Bed Mobility Overal bed mobility: Needs Assistance Bed Mobility: Supine to Sit, Sit to Supine     Supine to sit: Min assist     General bed mobility comments: needs assist for all mobility this date and becomes easily SOB with slight exertion.    Transfers Overall transfer level: Needs assistance Equipment used: Rolling walker (2 wheels) Transfers: Sit to/from Stand, Bed to chair/wheelchair/BSC Sit to Stand: Mod assist   Step pivot transfers: Mod assist       General transfer comment: needed increased assistance and multiple reps to perform transfer. Braces with B LEs against seated surface. Once standing, pt quickly collapses and returns to bed with help from therapist stating "my knees buckled". Pt then able to stand several more times and step pivot to recliner using RW    Ambulation/Gait               General Gait Details: not safe due to increased weakness and SOB symptoms. Anticipate need for chair follow for further mobility   Stairs             Wheelchair Mobility    Modified Rankin (Stroke Patients Only)       Balance Overall balance assessment: Needs assistance Sitting-balance support: No upper extremity supported, Feet supported Sitting balance-Leahy Scale: Fair     Standing balance support: Bilateral upper extremity supported Standing balance-Leahy Scale: Fair                              Cognition Arousal/Alertness: Awake/alert Behavior During Therapy: WFL for tasks assessed/performed, Anxious Overall Cognitive Status: Within Functional Limits for tasks assessed  Exercises Other Exercises Other Exercises: assisted with changing gown, needing min assist and noted decreased ROM in L shoulder    General Comments        Pertinent Vitals/Pain Pain Assessment Pain Assessment: 0-10 Pain Score: 2  Pain Location: L  shoulder Pain Descriptors / Indicators: Aching Pain Intervention(s): Limited activity within patient's tolerance, Premedicated before session    Home Living                          Prior Function            PT Goals (current goals can now be found in the care plan section) Acute Rehab PT Goals Patient Stated Goal: to return home PT Goal Formulation: With patient Time For Goal Achievement: 09/23/21 Potential to Achieve Goals: Good Progress towards PT goals: Progressing toward goals    Frequency    Min 2X/week      PT Plan Discharge plan needs to be updated    Co-evaluation              AM-PAC PT "6 Clicks" Mobility   Outcome Measure  Help needed turning from your back to your side while in a flat bed without using bedrails?: None Help needed moving from lying on your back to sitting on the side of a flat bed without using bedrails?: A Little Help needed moving to and from a bed to a chair (including a wheelchair)?: A Lot Help needed standing up from a chair using your arms (e.g., wheelchair or bedside chair)?: A Lot Help needed to walk in hospital room?: A Lot Help needed climbing 3-5 steps with a railing? : Total 6 Click Score: 14    End of Session Equipment Utilized During Treatment: Gait belt;Oxygen Activity Tolerance: Patient limited by fatigue Patient left: in chair (chair alarm pad in room, no box-RN notified) Nurse Communication: Mobility status PT Visit Diagnosis: Difficulty in walking, not elsewhere classified (R26.2);Muscle weakness (generalized) (M62.81)     Time: 5638-9373 PT Time Calculation (min) (ACUTE ONLY): 23 min  Charges:  $Therapeutic Activity: 23-37 mins                     Greggory Stallion, PT, DPT, GCS [phone]    Kerri Carter 09/10/2021, 12:58 PM

## 2021-09-10 NOTE — Assessment & Plan Note (Addendum)
Wheezing is improving, still symptomatic with exertion. - Continue steroids - Continue Singulair - Continue Pulmicort - Continue Duonebs

## 2021-09-11 ENCOUNTER — Ambulatory Visit: Payer: Medicare PPO | Admitting: Pulmonary Disease

## 2021-09-11 DIAGNOSIS — J441 Chronic obstructive pulmonary disease with (acute) exacerbation: Secondary | ICD-10-CM | POA: Diagnosis not present

## 2021-09-11 LAB — CBC
HCT: 27.5 % — ABNORMAL LOW (ref 36.0–46.0)
Hemoglobin: 8.8 g/dL — ABNORMAL LOW (ref 12.0–15.0)
MCH: 25.1 pg — ABNORMAL LOW (ref 26.0–34.0)
MCHC: 32 g/dL (ref 30.0–36.0)
MCV: 78.3 fL — ABNORMAL LOW (ref 80.0–100.0)
Platelets: 276 10*3/uL (ref 150–400)
RBC: 3.51 MIL/uL — ABNORMAL LOW (ref 3.87–5.11)
RDW: 18.3 % — ABNORMAL HIGH (ref 11.5–15.5)
WBC: 17.1 10*3/uL — ABNORMAL HIGH (ref 4.0–10.5)
nRBC: 0.1 % (ref 0.0–0.2)

## 2021-09-11 LAB — COMPREHENSIVE METABOLIC PANEL
ALT: 13 U/L (ref 0–44)
AST: 25 U/L (ref 15–41)
Albumin: 3 g/dL — ABNORMAL LOW (ref 3.5–5.0)
Alkaline Phosphatase: 80 U/L (ref 38–126)
Anion gap: 10 (ref 5–15)
BUN: 35 mg/dL — ABNORMAL HIGH (ref 8–23)
CO2: 30 mmol/L (ref 22–32)
Calcium: 7.7 mg/dL — ABNORMAL LOW (ref 8.9–10.3)
Chloride: 101 mmol/L (ref 98–111)
Creatinine, Ser: 1.37 mg/dL — ABNORMAL HIGH (ref 0.44–1.00)
GFR, Estimated: 40 mL/min — ABNORMAL LOW (ref >60)
Glucose, Bld: 201 mg/dL — ABNORMAL HIGH (ref 70–99)
Potassium: 3.9 mmol/L (ref 3.5–5.1)
Sodium: 141 mmol/L (ref 135–145)
Total Bilirubin: 0.7 mg/dL (ref 0.3–1.2)
Total Protein: 6.2 g/dL — ABNORMAL LOW (ref 6.5–8.1)

## 2021-09-11 LAB — RETIC PANEL
Immature Retic Fract: 43.6 % — ABNORMAL HIGH (ref 2.3–15.9)
RBC.: 3.44 MIL/uL — ABNORMAL LOW (ref 3.87–5.11)
Retic Count, Absolute: 75.3 10*3/uL (ref 19.0–186.0)
Retic Ct Pct: 2.2 % (ref 0.4–3.1)
Reticulocyte Hemoglobin: 21.3 pg — ABNORMAL LOW (ref >27.9)

## 2021-09-11 LAB — IRON AND TIBC
Iron: 24 ug/dL — ABNORMAL LOW (ref 28–170)
Saturation Ratios: 6 % — ABNORMAL LOW (ref 10.4–31.8)
TIBC: 377 ug/dL (ref 250–450)
UIBC: 353 ug/dL

## 2021-09-11 LAB — FERRITIN: Ferritin: 174 ng/mL (ref 11–307)

## 2021-09-11 LAB — MAGNESIUM: Magnesium: 2.5 mg/dL — ABNORMAL HIGH (ref 1.7–2.4)

## 2021-09-11 MED ORDER — STERILE WATER FOR INJECTION IJ SOLN
INTRAMUSCULAR | Status: AC
  Administered 2021-09-11: 1 mL
  Filled 2021-09-11: qty 10

## 2021-09-11 MED ORDER — SODIUM CHLORIDE 0.9 % IV SOLN
250.0000 mg | Freq: Every day | INTRAVENOUS | Status: AC
  Administered 2021-09-11 – 2021-09-12 (×2): 250 mg via INTRAVENOUS
  Filled 2021-09-11 (×2): qty 20

## 2021-09-11 MED ORDER — TORSEMIDE 20 MG PO TABS
20.0000 mg | ORAL_TABLET | Freq: Every day | ORAL | Status: DC
Start: 2021-09-11 — End: 2021-09-12
  Administered 2021-09-11 – 2021-09-12 (×2): 20 mg via ORAL
  Filled 2021-09-11 (×2): qty 1

## 2021-09-11 NOTE — Progress Notes (Signed)
Physical Therapy Treatment Patient Details Name: Kerri Carter MRN: [id number] DOB: [birth year] Today's Date: 09/11/2021   History of Present Illness Pt. is 81 y.o. female who was admitted to [facility] with a Right Closed Nondisplaced 5th Toe Fracture, Chronic Respiratory Failure, Diastolic Dysfunction, COPD,  CHF, Stage IIIB CKD, Depression, HTN, GERD, Interstitial Lung Disease, PAD, and Bipolar Disorder.    PT Comments    Pt is making gradual progress towards goals with ability to ambulate this date a short distance in room using RW. Post op shoe donned for mobility. Pt generally unsteady and needs constant tactile touch to maintain balance. Pt fatigues very quickly and has increased WOB with limited mobility. Educated on safety risk for decreased endurance in home environment. Still continuing to recommend SNF, pt is somewhat agreeable and wants more info form TOC. [facility]. Will continue to progress as able.   Recommendations for follow up therapy are one component of a multi-disciplinary discharge planning process, led by the attending physician.  Recommendations may be updated based on patient status, additional functional criteria and insurance authorization.  Follow Up Recommendations  Skilled nursing-short term rehab (<3 hours/day) Can patient physically be transported by private vehicle: Yes   Assistance Recommended at Discharge Intermittent Supervision/Assistance  Patient can return home with the following A lot of help with walking and/or transfers;A little help with bathing/dressing/bathroom   Equipment Recommendations  Wheelchair (measurements PT)    Recommendations for Other Services       Precautions / Restrictions Precautions Precautions: Fall Type of Shoulder Precautions: Right foot surgical shoe when up. Precaution Comments: hx of lumbar fusion 05/23/21 Restrictions Weight Bearing Restrictions: Yes RLE Weight Bearing: Weight bearing as  tolerated Other Position/Activity Restrictions: Right WBAT with the surgical shoe     Mobility  Bed Mobility               General bed mobility comments: NT pt in recliner pre/post session    Transfers Overall transfer level: Needs assistance Equipment used: Rolling walker (2 wheels) Transfers: Sit to/from Stand Sit to Stand: Min guard           General transfer comment: takes increased time. Pulls on RW    Ambulation/Gait Ambulation/Gait assistance: Min assist Gait Distance (Feet): 15 Feet Assistive device: Rolling walker (2 wheels) Gait Pattern/deviations: Step-to pattern       General Gait Details: ambulated short distance in room. Very SOB with exertion, however O2 sats at 96% on 3L of O2. Increased WOB and unsteady with mobility   Stairs             Wheelchair Mobility    Modified Rankin (Stroke Patients Only)       Balance Overall balance assessment: Needs assistance Sitting-balance support: No upper extremity supported, Feet supported Sitting balance-Leahy Scale: Good     Standing balance support: Bilateral upper extremity supported, During functional activity Standing balance-Leahy Scale: Fair                              Cognition Arousal/Alertness: Awake/alert Behavior During Therapy: WFL for tasks assessed/performed Overall Cognitive Status: Within Functional Limits for tasks assessed                                 General Comments: pleasant and agreeable to session        Exercises  General Comments General comments (skin integrity, edema, etc.): pt spo2 >905 on 3 L via [place] throughout mobility, pt required intermittent vcs for PLB/energy conservation techniques      Pertinent Vitals/Pain Pain Assessment Pain Assessment: No/denies pain    Home Living                          Prior Function            PT Goals (current goals can now be found in the care plan section) Acute  Rehab PT Goals Patient Stated Goal: to return home PT Goal Formulation: With patient Time For Goal Achievement: 09/23/21 Potential to Achieve Goals: Good Progress towards PT goals: Progressing toward goals    Frequency    Min 2X/week      PT Plan Current plan remains appropriate    Co-evaluation              AM-PAC PT "6 Clicks" Mobility   Outcome Measure  Help needed turning from your back to your side while in a flat bed without using bedrails?: None Help needed moving from lying on your back to sitting on the side of a flat bed without using bedrails?: A Little Help needed moving to and from a bed to a chair (including a wheelchair)?: A Little Help needed standing up from a chair using your arms (e.g., wheelchair or bedside chair)?: A Little Help needed to walk in hospital room?: A Lot Help needed climbing 3-5 steps with a railing? : A Lot 6 Click Score: 17    End of Session Equipment Utilized During Treatment: Gait belt;Oxygen Activity Tolerance: Patient limited by fatigue Patient left: in chair Nurse Communication: Mobility status PT Visit Diagnosis: Difficulty in walking, not elsewhere classified (R26.2);Muscle weakness (generalized) (M62.81)     Time: 3668-1594 PT Time Calculation (min) (ACUTE ONLY): 18 min  Charges:  $Gait Training: 8-22 mins                     Greggory Stallion, PT, DPT, GCS [phone]    Nadiah Corbit 09/11/2021, 4:39 PM

## 2021-09-11 NOTE — Progress Notes (Addendum)
    Durable Medical Equipment  (From admission, onward)           Start     Ordered   09/11/21 1021  For home use only DME standard manual wheelchair with seat cushion  Once       Comments: Patient suffers from interstitial lung disease which impairs their ability to perform daily activities like bathing, dressing, grooming, and toileting in the home.  A cane or walker will not resolve issue with performing activities of daily living. A wheelchair will allow patient to safely perform daily activities. Patient can safely propel the wheelchair in the home or has a caregiver who can provide assistance. Length of need Lifetime. Accessories: elevating leg rests (ELRs), wheel locks, extensions and anti-tippers.   09/11/21 [place], MD 09/11/2021

## 2021-09-11 NOTE — Consult Note (Addendum)
   [name] Inpatient Consult   09/11/2021  Kerri Carter [birth year] [id number]  [place] [facility] [ACO] Patient: [name]  *Remote review coverage for patient at [facility]  Reviewed for extreme high risk for unplanned readmission score and for readmission prevention screen for less than 30 days readmission noted, 5 admissions in the past 6 months  Primary Care Provider:  McLean-Scocuzza, Nino Glow, MD, [place] at [facility], is an embedded provider with a Chronic Care Management team and program, and is listed for the transition of care follow up and appointments.  Patient was screened for Embedded practice service needs for chronic care management and currently has a [facility] Embedded RNCM on the care team for care coordination/CCM.    Review reveals patient desires to return home with home health and declining a skilled nursing facility for ST rehab needs per inpatient TOC LCSW note.  PT notes reviewed.  Plan: Continue to follow for needs and update will be sent to the [place] Management on team and made aware of TOC needs for post hospital needs.  Please contact for further questions,  Natividad Brood, RN BSN [facility] Liaison  [phone] business mobile phone Toll free office [phone]  Fax number: [phone] [email or web address]

## 2021-09-11 NOTE — Progress Notes (Signed)
Progress Note   Patient: Kerri Carter AUQ:[id number] DOB: 04-24-1940 DOA: 09/08/2021     3 DOS: the patient was seen and examined on 09/11/2021       Brief hospital course: Mrs. Hargreaves is a 81 y.o. F with COPD and chronic respiratory failure on PRN O2 at home, dCHF, CKD IIIb, HTN and ILD who presented with shortness of breath.     Assessment and Plan: * Acute respiratory failure with hypoxia  COPD with exacerbation ([place]) Interstitial pulmonary disease ([place]) Respiratory failure is improving, this was multifactorial from COPD, ILD, CHF, and possibly superimposed infectious pneumonia.  Wheezing is improving, still symptomatic with exertion but closer to baseline. - Continue steroids - Continue Singulair - Continue Pulmicort - Continue Duonebs  Acute on chronic diastolic CHF (congestive heart failure) (HCC) CXR with edema, reported SOB with exertion and orthopnea.  Diuresed 5.2L so far.   - Transition to torsemide - Strict I/Os, daily weights, telemetry  - Daily monitoring renal function    Recurrent pneumonia Procal elevated.  CT chest shows new patchy infiltrates in right upper lobe and left upper lobe as well as right lower lobe - Continue Unasyn - Follow sputum culture - Continue Mucinex, pulmonary toilet  Paroxysmal atrial fibrillation (HCC) Rate normal - Continue BB and Eliquis  Essential hypertension BP controlled - Continue amlodipine, nebivolol  Physical deconditioning    Microcytic anemia Hgb was 12 g/dL last Dec, has trended down to 8-9 g/dl more recently.  During this hospitalization was as low as 6.9 g/dL. Transfused 1 unit 8/8.  Ferritin normal, but iron and iron sat very low, microcytic, hypoproliferative, and RDW elevated.  Likely GI blood loss in the setting of Eliquis.  As she has had no frank bleeding, I would favor continuing anticoagulation for now, iron repletion, and expedited GI work up. - GI referral outpatient - Ferrlecit IV iron now -  Trend Hgb - Continue Eliquis  Rheumatoid arthritis ([place]) - Continue leflunomide  Mixed bipolar I disorder (HCC) - Continue BuSpar, Lexapro and Seroquel, Lamictal  Stage 3b chronic kidney disease (HCC)    Gastroesophageal reflux disease - Continue PPI    Toe fracture, right Status post recent fall with complaints of pain in her right hip and foot. Right foot x-ray shows acute minimally impacted fracture at the base of the fifth proximal phalanx. Podiatry consulted  --weightbearing as tolerated in surgical shoe   PAD (peripheral artery disease) (HCC) - Continue Eliquis, pravatstatin           Subjective: Patient still significantly symptomatic, coughing, but overall improving.  No confusion, fever, vomiting.     Physical Exam: Vitals:   09/11/21 0440 09/11/21 0746 09/11/21 0813 09/11/21 1121  BP:  127/63  (!) 140/64  Pulse:  84  92  Resp:  18  16  Temp:  98.4 F (36.9 C)  97.8 F (36.6 C)  TempSrc:    Oral  SpO2:  97% 98% 96%  Weight: 81.2 kg     Height:       Elderly adult female, lying in bed, no acute distress, interactive RRR, no murmurs, no peripheral edema Respiratory rate normal, increased with exertion, speaks in short sentences, no rales, no wheezing, but there is some coarse breath sounds bilaterally Abdomen soft.  No focal pain, no guarding, no ascites, no rebound Attention normal, affect normal, judgment and insight normal, speech fluent, face symmetric, generalized weakness  Data Reviewed: Complete blood count shows persistent leukocytosis, stable anemia, microcytosis Metabolic panel shows  stable renal function  Family Communication: Son by phone    Disposition: Status is: Inpatient The patient was admitted for respiratory failure in the setting of CHF, pneumonia, interstitial lung disease, and COPD.  She is being treated with antibiotics, steroids, and diuretics.  She is close to baseline, but I feel that she warrants IV iron for 1  more day, and likely discharge to home with home health tomorrow with expedited GI follow up        Author: Edwin Dada, MD 09/11/2021 1:20 PM  For on call review [email or web address].

## 2021-09-11 NOTE — TOC Progression Note (Addendum)
Transition of Care [facility]) - Progression Note    Patient Details  Name: Kerri Carter MRN: [id number] Date of Birth: 1940/08/27  Transition of Care [facility]) CM/SW [place], [place] Phone Number: 09/11/2021, 10:02 AM  Clinical Narrative:     CSW did speak with patient regaridng change of recommendations from [facility] to SNF. She reports she was unaware of this change, and declines SNF at this time. Prefers to continue with her plan to go home with [facility] as she is familiar with her [facility] team.   Patient is set up with [facility] for PT OT and aide. Wheel chair ordered via [employer] to be delivered to hospital room.   At time of discharge patient will have grandson transport home.   At delivery of [facility] by [employer] patient had requested POC eval, orders in and [place] with [employer] updated.   Expected Discharge Plan: [place] Barriers to Discharge: Continued Medical Work up  Expected Discharge Plan and Services Expected Discharge Plan: [place] arrangements for the past 2 months: Single Family Home                           HH Arranged: PT, OT, Nurse's Aide HH Agency: [place] Date [place]: 09/10/21 Time [place]: 1011 Representative spoke with at [place]: [place]   Social Determinants of Health ([place]) Interventions    Readmission Risk Interventions    08/21/2021    3:19 PM 05/24/2021    2:35 PM  Readmission Risk Prevention Plan  Transportation Screening Complete Complete  PCP or Specialist Appt within 3-5 Days  Complete  HRI or [place]  Complete  Social Work Consult for [place] Planning/Counseling  Complete  Palliative Care Screening  Not Applicable  Medication Review Press photographer) Complete Complete  HRI or [place] Complete   SW Recovery Care/Counseling Consult Complete   [place] Not Applicable

## 2021-09-11 NOTE — Progress Notes (Signed)
Occupational Therapy Treatment Patient Details Name: Kerri Carter MRN: [id number] DOB: 12/06/1940 Today's Date: 09/11/2021   History of present illness Pt. is 80 y.o. female who was admitted to [facility] with a Right Closed Nondisplaced 5th Toe Fracture, Chronic Respiratory Failure, Diastolic Dysfunction, COPD,  CHF, Stage IIIB CKD, Depression, HTN, GERD, Interstitial Lung Disease, PAD, and Bipolar Disorder.   OT comments  Chart reviewed, RN cleared pt for participation in OT tx session. Tx session targeted improving endurance and activity tolerance to facilitate improved and safe ADL completion. Pt performs LB dressing (post op shoe) with MIN A, STS with CGA, amb 20' in room with RW with CGA, one seated rest break. Grooming and feeding tasks completed with set up. Education provided re: energy conservation techniques, home safety, falls prevention. Pt reports she will be discharging home. Mwc is in room. Educated pt on use to prevent further falls, use of EC techniques at home to continue to prevent falls. Pt is left in bedside chair, NAD, all needs met. Discharge recommendation remains appropriate. OT will follow acutely.    Recommendations for follow up therapy are one component of a multi-disciplinary discharge planning process, led by the attending physician.  Recommendations may be updated based on patient status, additional functional criteria and insurance authorization.    Follow Up Recommendations  Home health OT    Assistance Recommended at Discharge Intermittent Supervision/Assistance  Patient can return home with the following  A little help with bathing/dressing/bathroom;Assistance with cooking/housework;Assist for transportation;Direct supervision/assist for financial management;Direct supervision/assist for medications management   Equipment Recommendations  Wheelchair (measurements OT)    Recommendations for Other Services      Precautions / Restrictions  Precautions Precautions: Fall Type of Shoulder Precautions: Right foot surgical shoe when up. Restrictions Weight Bearing Restrictions: Yes RLE Weight Bearing: Weight bearing as tolerated Other Position/Activity Restrictions: Right WBAT with the surgical shoe       Mobility Bed Mobility               General bed mobility comments: NT pt in recliner pre/post session    Transfers Overall transfer level: Needs assistance Equipment used: Rolling walker (2 wheels) Transfers: Sit to/from Stand Sit to Stand: Min guard (from recliner)                 Balance Overall balance assessment: Needs assistance Sitting-balance support: No upper extremity supported, Feet supported Sitting balance-Leahy Scale: Good     Standing balance support: Bilateral upper extremity supported, During functional activity Standing balance-Leahy Scale: Fair                             ADL either performed or assessed with clinical judgement   ADL Overall ADL's : Needs assistance/impaired Eating/Feeding: Set up;Sitting   Grooming: Sitting;Set up           Upper Body Dressing : Set up;Sitting   Lower Body Dressing: Minimal assistance Lower Body Dressing Details (indicate cue type and reason): for post op shoe Toilet Transfer: Min guard;Ambulation;Rolling walker (2 wheels) Toilet Transfer Details (indicate cue type and reason): simulated to bedside chair Toileting- Clothing Manipulation and Hygiene: Minimal assistance;Sit to/from stand Toileting - Clothing Manipulation Details (indicate cue type and reason): underwear management     Functional mobility during ADLs: Min guard;Rolling walker (2 wheels) (approx 20' in room with one seated rest break. Spo2 >88% throughout on 3 L via [place])      Extremity/Trunk Assessment                Vision       Perception     Praxis      Cognition Arousal/Alertness: Awake/alert Behavior During Therapy: WFL for tasks  assessed/performed Overall Cognitive Status: Within Functional Limits for tasks assessed                                          Exercises      Shoulder Instructions       General Comments pt spo2 >905 on 3 L via [place] throughout mobility, pt required intermittent vcs for PLB/energy conservation techniques    Pertinent Vitals/ Pain       Pain Assessment Pain Assessment: No/denies pain  Home Living                                          Prior Functioning/Environment              Frequency  Min 2X/week        Progress Toward Goals  OT Goals(current goals can now be found in the care plan section)  Progress towards OT goals: Progressing toward goals     Plan Discharge plan remains appropriate;Frequency remains appropriate    Co-evaluation                 AM-PAC OT "6 Clicks" Daily Activity     Outcome Measure   Help from another person eating meals?: None Help from another person taking care of personal grooming?: A Little Help from another person toileting, which includes using toliet, bedpan, or urinal?: A Little Help from another person bathing (including washing, rinsing, drying)?: A Lot Help from another person to put on and taking off regular upper body clothing?: A Little Help from another person to put on and taking off regular lower body clothing?: A Little 6 Click Score: 18    End of Session Equipment Utilized During Treatment: Rolling walker (2 wheels);Oxygen  OT Visit Diagnosis: Unsteadiness on feet (R26.81);Muscle weakness (generalized) (M62.81)   Activity Tolerance Patient tolerated treatment well   Patient Left with call bell/phone within reach;in chair   Nurse Communication Mobility status        Time: 7915-0413 OT Time Calculation (min): 24 min  Charges: OT General Charges $OT Visit: 1 Visit OT Treatments $Self Care/Home Management : 8-22 mins $Therapeutic Activity: 8-22 mins  Shanon Payor, OTD OTR/L  09/11/21, 3:03 PM

## 2021-09-11 NOTE — Care Management Important Message (Signed)
Important Message  Patient Details  Name: Kerri Carter MRN: [id number] Date of Birth: April 27, 1940   Medicare Important Message Given:  Yes     Dannette Barbara 09/11/2021, 1:16 PM

## 2021-09-12 DIAGNOSIS — I5033 Acute on chronic diastolic (congestive) heart failure: Secondary | ICD-10-CM | POA: Diagnosis not present

## 2021-09-12 DIAGNOSIS — I10 Essential (primary) hypertension: Secondary | ICD-10-CM | POA: Diagnosis not present

## 2021-09-12 DIAGNOSIS — J9621 Acute and chronic respiratory failure with hypoxia: Secondary | ICD-10-CM | POA: Diagnosis not present

## 2021-09-12 DIAGNOSIS — J441 Chronic obstructive pulmonary disease with (acute) exacerbation: Secondary | ICD-10-CM | POA: Diagnosis not present

## 2021-09-12 LAB — CBC
HCT: 31.1 % — ABNORMAL LOW (ref 36.0–46.0)
Hemoglobin: 9.6 g/dL — ABNORMAL LOW (ref 12.0–15.0)
MCH: 24.5 pg — ABNORMAL LOW (ref 26.0–34.0)
MCHC: 30.9 g/dL (ref 30.0–36.0)
MCV: 79.3 fL — ABNORMAL LOW (ref 80.0–100.0)
Platelets: 312 10*3/uL (ref 150–400)
RBC: 3.92 MIL/uL (ref 3.87–5.11)
RDW: 19.5 % — ABNORMAL HIGH (ref 11.5–15.5)
WBC: 15.7 10*3/uL — ABNORMAL HIGH (ref 4.0–10.5)
nRBC: 0.2 % (ref 0.0–0.2)

## 2021-09-12 LAB — BASIC METABOLIC PANEL
Anion gap: 8 (ref 5–15)
BUN: 29 mg/dL — ABNORMAL HIGH (ref 8–23)
CO2: 29 mmol/L (ref 22–32)
Calcium: 7.7 mg/dL — ABNORMAL LOW (ref 8.9–10.3)
Chloride: 104 mmol/L (ref 98–111)
Creatinine, Ser: 1.18 mg/dL — ABNORMAL HIGH (ref 0.44–1.00)
GFR, Estimated: 47 mL/min — ABNORMAL LOW (ref >60)
Glucose, Bld: 127 mg/dL — ABNORMAL HIGH (ref 70–99)
Potassium: 3.7 mmol/L (ref 3.5–5.1)
Sodium: 141 mmol/L (ref 135–145)

## 2021-09-12 LAB — MAGNESIUM: Magnesium: 2.5 mg/dL — ABNORMAL HIGH (ref 1.7–2.4)

## 2021-09-12 MED ORDER — PREDNISONE 10 MG PO TABS: ORAL_TABLET | ORAL | 0 refills | Status: DC

## 2021-09-12 MED ORDER — TORSEMIDE 20 MG PO TABS: 20.0000 mg | ORAL_TABLET | Freq: Every day | ORAL | 3 refills | Status: DC

## 2021-09-12 MED ORDER — FERROUS SULFATE 325 (65 FE) MG PO TBEC: 325.0000 mg | DELAYED_RELEASE_TABLET | Freq: Two times a day (BID) | ORAL | 3 refills | Status: DC

## 2021-09-12 NOTE — Discharge Summary (Signed)
Physician Discharge Summary   Patient: Kerri Carter MRN: [id number] DOB: [birth year]  Admit date:     09/08/2021  Discharge date: 09/12/21  Discharge Physician: Edwin Dada   PCP: McLean-Scocuzza, Nino Glow, MD     Recommendations at discharge:  Follow up with PCP Dr. Terese Door in 1 week Follow up with Pulmonology Dr. Patsey Berthold in 1-2 weeks Dr. Terese Door: Please check CBC in 1 week, iron saturation in 4 weeks Dr. McLean-Scocuzza: Please refer for GI consultation for iron deficiency anemia Dr. McLean-Scocuzza: Please refer to Podiatry or Orthopedics for right toe fracture     Discharge Diagnoses: Principal Problem:   Acute on chronic respiratory failure with hypoxia ([place]) Active Problems:   Interstitial pulmonary disease ([place])   COPD with exacerbation ([place])   Recurrent pneumonia   Acute on chronic diastolic CHF (congestive heart failure) ([place])    Essential hypertension   Paroxysmal atrial fibrillation (HCC)   Microcytic anemia   Physical deconditioning   Rheumatoid arthritis ([place])   Stage 3b chronic kidney disease ([place])   Mixed bipolar I disorder (HCC)   Gastroesophageal reflux disease   PAD (peripheral artery disease) (HCC)   Stage 3a chronic kidney disease (CKD) (HCC)   Toe fracture, right      Hospital Course: Kerri Carter is a 81 y.o. F with COPD and chronic respiratory failure on PRN O2 at home, dCHF, CKD IIIb, HTN and ILD who presented with shortness of breath.     * Acute on chronic respiratory failure with hypoxia (HCC) Overnight patient with respiratory distress, required BiPAP and then weaned to 5L this afternoon.  Due to CHF and COPD in setting of ILD.  Sputum culture with rare yeast only.  Discharged on steroid taper.  Completed antibiotics.  Discharged on torsemide.        Microcytic anemia Hgb was 12 g/dL last Dec, has trended down to 8-9 g/dl more recently.  During this hospitalization was as low as 6.9 g/dL. Transfused 1 unit  8/8.  Ferritin normal, but iron and iron sat very low, microcytic, hypoproliferative, and RDW elevated.  Likely GI blood loss in the setting of Eliquis.  As she has had no frank bleeding, I would favor continuing anticoagulation for now, iron repletion, and expedited GI work up.  Given iron in the hospital, discharged on ferrous sulfate. - GI referral outpatient - Continue Eliquis   Toe fracture, right Status post recent fall with complaints of pain in her right hip and foot.  Right foot x-ray showed acute minimally impacted fracture at the base of the fifth proximal phalanx.  Podiatry consulted, they recommended weightbearing as tolerated in surgical shoe and outpatient follow up               The [place] was reviewed for this patient prior to discharge.   Consultants: Podiatry   Disposition: Home health, patient declined SNF Diet recommendation:  Discharge Diet Orders (From admission, onward)     Start     Ordered   09/12/21 0000  Diet - low sodium heart healthy        09/12/21 1238             DISCHARGE MEDICATION: Allergies as of 09/12/2021       Reactions   Ceftin [cefuroxime Axetil] Anaphylaxis   Lisinopril Anaphylaxis   Sulfa Antibiotics Other (See Comments)   Face swelling   Sulfasalazine Anaphylaxis   Morphine Other (See Comments)   Per patient, low blood pressure  issues that requires action to raise it back up. Can take small infrequent doses   Xarelto [rivaroxaban] Other (See Comments)   Stomach burning, bleeding, and tar in stool   Adhesive [tape] Rash   Paper tape and tega derm OK   Antihistamines, Chlorpheniramine-type Other (See Comments)   Makes pt hyper   Antivert [meclizine Hcl] Other (See Comments)   Bladder will not empty   Aspirin Other (See Comments)   Sulfasalazine allergy cross reacts   Contrast Media [iodinated Contrast Media] Rash   she is able to use betadine scrubs.   Decongestant  [pseudoephedrine Hcl] Other (See Comments)   Makes pt hyper   Doxycycline Other (See Comments)   GI upset   Levaquin [levofloxacin In D5w] Rash   Polymyxin B Other (See Comments)   Facial rash   Tetanus Toxoids Rash, Other (See Comments)   Fever and hot to touch at injection site        Medication List     STOP taking these medications    oxyCODONE-acetaminophen 5-325 MG tablet Commonly known as: Percocet       TAKE these medications    albuterol 108 (90 Base) MCG/ACT inhaler Commonly known as: Ventolin HFA Inhale 2 puffs into the lungs every 6 (six) hours as needed for wheezing or shortness of breath.   amLODipine 5 MG tablet Commonly known as: NORVASC Take 1 tablet (5 mg total) by mouth daily.   apixaban 5 MG Tabs tablet Commonly known as: ELIQUIS Take 1 tablet (5 mg total) by mouth 2 (two) times daily.   budesonide 0.25 MG/2ML nebulizer solution Commonly known as: PULMICORT USE 1 VIAL  IN  NEBULIZER TWICE  DAILY - rinse mouth after treatment   busPIRone 5 MG tablet Commonly known as: BUSPAR Take 1 tablet (5 mg total) by mouth 2 (two) times daily.   dicyclomine 10 MG capsule Commonly known as: BENTYL Take 1 capsule (10 mg total) by mouth 2 (two) times daily as needed for spasms. What changed:  how much to take when to take this   escitalopram 10 MG tablet Commonly known as: LEXAPRO Take 1 tablet (10 mg total) by mouth daily.   famotidine 20 MG tablet Commonly known as: PEPCID TAKE 1 TABLET (20 MG TOTAL) BY MOUTH DAILY. BEFORE BREAKFAST OR DINNER   ferrous sulfate 325 (65 FE) MG EC tablet Take 1 tablet (325 mg total) by mouth 2 (two) times daily.   fluticasone 50 MCG/ACT nasal spray Commonly known as: Flonase Place 2 sprays into both nostrils daily. In am   gabapentin 300 MG capsule Commonly known as: NEURONTIN TAKE 2 CAPSULES (600 MG TOTAL) BY MOUTH IN THE MORNING AND AT BEDTIME.   ipratropium-albuterol 0.5-2.5 (3) MG/3ML Soln Commonly known  as: DUONEB Take 3 mLs by nebulization 3 (three) times daily as needed.   lamoTRIgine 100 MG tablet Commonly known as: LAMICTAL TAKE 1 TAB 2 TIMES DAILY. FURTHER REFILLS NEW PSYCH FOR ALL PSYCH MEDS ONLY TEMP SUPPLY FROM PCP   leflunomide 20 MG tablet Commonly known as: ARAVA Take 1 tablet (20 mg total) by mouth daily.   lidocaine 5 % Commonly known as: Lidoderm Place 1 patch onto the skin 2 (two) times daily as needed. Remove & Discard patch within 12 hours or as directed by MD What changed: when to take this   lovastatin 20 MG tablet Commonly known as: MEVACOR TAKE 1 TABLET BY MOUTH EVERYDAY AT BEDTIME *STOP TALKING '40MG'$ * What changed: See the new instructions.  methocarbamol 500 MG tablet Commonly known as: ROBAXIN Take 500 mg by mouth 4 (four) times daily as needed. Taking twice a day currently   mirabegron ER 50 MG Tb24 tablet Commonly known as: Myrbetriq Take 1 tablet (50 mg total) by mouth daily.   montelukast 10 MG tablet Commonly known as: SINGULAIR TAKE 1 TABLET BY MOUTH EVERY DAY   multivitamin-lutein Caps capsule Take 1 capsule by mouth at bedtime.   nebivolol 5 MG tablet Commonly known as: Bystolic Take 1 tablet (5 mg total) by mouth in the morning and at bedtime. (Note dose changed from 1/2 10 mg bid to 5 mg bid)   OXYGEN Inhale 2 L into the lungs at bedtime.   pantoprazole 40 MG tablet Commonly known as: PROTONIX TAKE 1 TABLET BY MOUTH 2 TIMES DAILY 30 MIN BEFORE FOOD (NOTE REDUCTION IN FREQUENCY)   predniSONE 10 MG tablet Commonly known as: DELTASONE Take 40 mg (4 tabs) for 1 day then take 30 mg (3 tabs) for 1 day then take 20 mg (2 tabs) for 1 day then take 10 mg (1 tab) for 1 day then stop   QUEtiapine 25 MG tablet Commonly known as: SEROQUEL TAKE 1 TABLET (25 MG TOTAL) BY MOUTH AT BEDTIME. AGAIN LAST FILL FURTHER REFILLS FROM PSYCHIATRY NO EXCEPTIONS   sucralfate 1 g tablet Commonly known as: CARAFATE Take 1 tablet (1 g total) by mouth 2  (two) times daily.   torsemide 20 MG tablet Commonly known as: DEMADEX Take 1 tablet (20 mg total) by mouth daily.   Vitamin D3 50 MCG (2000 UT) Tabs Take 4,000 Units by mouth daily.   zoledronic acid 5 MG/100ML Soln injection Commonly known as: RECLAST Inject 5 mg into the vein once.               Durable Medical Equipment  (From admission, onward)           Start     Ordered   09/11/21 1339  For home use only DME Other see comment  Once       Comments: POC eval for home use only  Question:  Length of Need  Answer:  Lifetime   09/11/21 1339   09/11/21 1021  For home use only DME standard manual wheelchair with seat cushion  Once       Comments: Patient suffers from interstitial lung disease which impairs their ability to perform daily activities like bathing, dressing, grooming, and toileting in the home.  A cane or walker will not resolve issue with performing activities of daily living. A wheelchair will allow patient to safely perform daily activities. Patient can safely propel the wheelchair in the home or has a caregiver who can provide assistance. Length of need Lifetime. Accessories: elevating leg rests (ELRs), wheel locks, extensions and anti-tippers.   09/11/21 1020            Follow-up Information     McLean-Scocuzza, Nino Glow, MD. Schedule an appointment as soon as possible for a visit in 1 week(s).   Specialty: Internal Medicine Contact information: [place] [address] [phone]         [name], MD. Call.   Specialty: Pulmonary Disease Contact information: [place] [address] [phone]                 Discharge Instructions     Diet - low sodium heart healthy   Complete by: As directed  Discharge instructions   Complete by: As directed    From Dr. Loleta Books: You were admitted for trouble breathing. Here, we found that this was from a combination of anemia (low  blood levels), congestive heart failure flaring up, and your interstitial lung disease, plus probably a bacterial pneumonia (infection)  You were given a blood transfusion to correct the anemia, and given iron to replete your iron levels.  You should take ferrous sulfate 325 mg every other day with a stool softener like Colace (this is a pill form of iron supplement)   For the congestive heart failure, you were treated with diuretics You should resume your home diuretic torsemide and have your primary care doctor check your kidney function in 1 week   For the interstitial lung disease and pneumonia you were treated with steroids and antibiotics You completed the course of antibiotics in the hospital You should finish the taper of steroids with prednisone in the following way: Take 40 mg (4 tabs) for 1 day then  take 30 mg (3 tabs) for 1 day then  take 20 mg (2 tabs) for 1 day then  take 10 mg (1 tab) for 1 day then stop   Call Dr. Domingo Dimes office to schedule a follow-up appointment within 2 weeks  Work with your PCP within 1 week It is important that you see your PCP soon so that you can get a referral to a gastroenterologist to understand why you are having anemia from iron deficiency   Increase activity slowly   Complete by: As directed        Discharge Exam: Filed Weights   09/10/21 0500 09/11/21 0440 09/12/21 0425  Weight: 79.8 kg 81.2 kg 80.3 kg    General: Pt is alert, awake, not in acute distress, sitting in recliner, interactive Cardiovascular: RRR, nl S1-S2, no murmurs appreciated.   No LE edema.   Respiratory: Normal respiratory rate and rhythm.  She has rales at bases, fine, related to ILD, no change from baseline, no wheezing. Abdominal: Abdomen soft and non-tender.  No distension or HSM.   Neuro/Psych: Strength symmetric in upper and lower extremities.  Judgment and insight appear normal.   Condition at discharge: good  The results of significant  diagnostics from this hospitalization (including imaging, microbiology, ancillary and laboratory) are listed below for reference.   Imaging Studies: DG Chest Port 1 View  Result Date: 09/09/2021 CLINICAL DATA:  Shortness of breath. EXAM: PORTABLE CHEST 1 VIEW COMPARISON:  Radiographs and CT yesterday. FINDINGS: Rotated exam. Worsening heterogeneous bilateral airspace disease, right greater than left. Stable heart size and mediastinal contours. No pneumothorax. Pleural effusions on CT are not well-defined, obscured by lower lobe airspace disease. Stable orthopedic hardware. IMPRESSION: Worsening heterogeneous bilateral airspace disease, right greater than left, suspicious for worsening multifocal pneumonia. An element of superimposed pulmonary edema is also considered. Electronically Signed   By: Keith Rake M.D.   On: 09/09/2021 20:17   DG Swallowing Func-Speech Pathology  Result Date: 09/09/2021 Table formatting from the original result was not included. Objective Swallowing Evaluation: Type of Study: MBS-Modified Barium Swallow Study  Patient Details Name: BRYTNI DRAY MRN: [id number] Date of Birth: 08-26-1940 Today's Date: 09/09/2021 Time: SLP Start Time (ACUTE ONLY): 1440 -SLP Stop Time (ACUTE ONLY): 1500 SLP Time Calculation (min) (ACUTE ONLY): 20 min Past Medical History: Past Medical History: Diagnosis Date  Anxiety   Aortic stenosis 07/09/2015  a.) TTE 07/06/2015: EF 55-60%; mild AS with MPG 13.0 mmHg.  Arrhythmia  atrial fibrillation  Asthma   Bipolar disorder (HCC)   C. difficile diarrhea 2010  a.) following ABX course during hospital admission  Carotid atherosclerosis, bilateral   a.) Moderate; < 50% stenosis BILATERAL ICAs.  Cataract   a.) s/p BILATERAL extraction  CHF (congestive heart failure) (HCC)   Chicken pox   CKD (chronic kidney disease), stage III (HCC)   a. s/p R nephrectomy./ aneurysm  Conversion disorder   COPD (chronic obstructive pulmonary disease) (HCC)   Depression   Essential  hypertension   GERD (gastroesophageal reflux disease)   Heart murmur   Hyperlipidemia   ILD (interstitial lung disease) (HCC)   mild; 2/2 RA diagnosis  Inflammatory arthritis   a. hands/carpal tunnel.  b. Low titer rheumatoid factor. c. Negative anti-CCP antibodies. d. Plaquenil.  Macular degeneration   Nocturnal hypoxemia   Non-Obstructive CAD   a. 07/2009 Cath ([facility]): nonobs dzs;  b. 03/2011 Cath [facility]): nonobs dzs.  Osteoarthritis   a. Knees.  PAD (peripheral artery disease) (HCC)   PUD (peptic ulcer disease)   S/P right hip fracture   11/01/16 s/p repair  Shoulder pain   Sleep apnea   no cpap / minimal  Spinal stenosis at L4-L5 level   severe with L4/L5 anterolisthesis grade 1 anterolisthesis   Toxic maculopathy   Valvular heart disease   a.) TTE 07/06/2015: EF 55-60%; Mild MR/AR/TR; Mild AS with MPG 13.0 mmHg. Past Surgical History: Past Surgical History: Procedure Laterality Date  APPENDECTOMY    APPLICATION OF INTRAOPERATIVE CT SCAN N/A 5/57/3220  Procedure: APPLICATION OF INTRAOPERATIVE CT SCAN;  Surgeon: Meade Maw, MD;  Location: [facility];  Service: Neurosurgery;  Laterality: N/A;  BACK SURGERY    lumbar fusion  BUNIONECTOMY Right   CATARACT EXTRACTION, BILATERAL    CESAREAN SECTION    x1  CHOLECYSTECTOMY N/A 05/11/2016  Procedure: LAPAROSCOPIC CHOLECYSTECTOMY;  Surgeon: Florene Glen, MD;  Location: [facility];  Service: General;  Laterality: N/A;  COLONOSCOPY WITH PROPOFOL N/A 04/02/2016  Procedure: COLONOSCOPY WITH PROPOFOL;  Surgeon: Jonathon Bellows, MD;  Location: [facility];  Service: Endoscopy;  Laterality: N/A;  ENDOSCOPIC RETROGRADE CHOLANGIOPANCREATOGRAPHY (ERCP) WITH PROPOFOL N/A 05/08/2016  Procedure: ENDOSCOPIC RETROGRADE CHOLANGIOPANCREATOGRAPHY (ERCP) WITH PROPOFOL;  Surgeon: Lucilla Lame, MD;  Location: [facility];  Service: Endoscopy;  Laterality: N/A;  ERCP    with biliary spincterotomy 05/08/16 Dr. Allen Norris for choledocholithiasis   ESOPHAGEAL DILATION  04/02/2016  Procedure: ESOPHAGEAL  DILATION;  Surgeon: Jonathon Bellows, MD;  Location: [facility];  Service: Endoscopy;;  ESOPHAGOGASTRODUODENOSCOPY (EGD) WITH PROPOFOL N/A 04/02/2016  Procedure: ESOPHAGOGASTRODUODENOSCOPY (EGD) WITH PROPOFOL;  Surgeon: Jonathon Bellows, MD;  Location: [facility];  Service: Endoscopy;  Laterality: N/A;  HIP ARTHROPLASTY Right 11/01/2016  Procedure: ARTHROPLASTY BIPOLAR HIP (HEMIARTHROPLASTY);  Surgeon: Corky Mull, MD;  Location: [facility];  Service: Orthopedics;  Laterality: Right;  LUMBAR LAMINECTOMY/DECOMPRESSION MICRODISCECTOMY N/A 12/11/2020  Procedure: LEFT L2-3 MICRODISCECTOMY, L3-4 AND L5-S1 DECOMPRESSION;  Surgeon: Meade Maw, MD;  Location: [facility];  Service: Neurosurgery;  Laterality: N/A;  NEPHRECTOMY  1988  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  ORIF SCAPHOID FRACTURE Left 12/21/2019  Procedure: OPEN REDUCTION INTERNAL FIXATION (ORIF) OF LEFT SCAPULAR NONUNION WITH BONE GRAFT;  Surgeon: Corky Mull, MD;  Location: [facility];  Service: Orthopedics;  Laterality: Left;  osteoporosis    noted DEXA 08/19/16   REPLACEMENT TOTAL KNEE Right   REVERSE SHOULDER ARTHROPLASTY Right 11/04/2017  Procedure: REVERSE SHOULDER ARTHROPLASTY;  Surgeon: Corky Mull, MD;  Location: [facility];  Service: Orthopedics;  Laterality: Right;  REVERSE SHOULDER ARTHROPLASTY Left 07/26/2018  Procedure: REVERSE SHOULDER ARTHROPLASTY;  Surgeon: Corky Mull, MD;  Location: [facility];  Service: Orthopedics;  Laterality: Left;  TONSILLECTOMY    TOTAL HIP ARTHROPLASTY  12/10/11  [facility] left hip  TOTAL HIP ARTHROPLASTY Bilateral   TRANSFORAMINAL LUMBAR INTERBODY FUSION (TLIF) WITH PEDICLE SCREW FIXATION 3 LEVEL N/A 05/23/2021  Procedure: OPEN L2-4 TRANSFORAMINAL LUMBAR INTERBODY FUSION (TLIF) WITH L2-5 POSTERIOR SPINAL FUSION;  Surgeon: Meade Maw, MD;  Location: [facility];  Service: Neurosurgery;  Laterality: N/A;  TUBAL LIGATION   HPI: Per H&P "KYERRA VARGO is a 81 y.o. female with medical history  significant for COPD with chronic respiratory failure on 3 L of oxygen, history of chronic diastolic dysfunction CHF, stage IIIb chronic kidney disease, depression, hypertension, GERD, interstitial lung disease, peripheral arterial disease, bipolar disorder who presents to the ER via EMS for evaluation of worsening shortness of breath.  Patient was recently hospitalized from 07/19 - 07/21 and was discharged home on a steroid taper.  Prior to the hospitalization she was admitted from 06/20 - 06/22 for community-acquired pneumonia.  She states that symptoms started 2 days prior to her presentation and has steadily worsened since then.  Shortness of breath is associated with a nonproductive and wheezing.  She denies having any fever or chills and denies having any sick contacts.  Per EMS she had pulse oximetry of 84% on her 3 L of oxygen and was placed on a CPAP prior to transport to the ER.  She received DuoNeb, albuterol and 125 mg of IV Solu-Medrol.  She denies having any chest pain, no nausea, no vomiting, no abdominal pain, no changes in her bowel habits, no urinary symptoms, no dizziness, no lightheadedness, no leg swelling, no headache, no blurred vision no focal deficit."  Subjective: Pt alert, pleasant, and cooperative. Denies s/sx oropharyngeal dysphagia. On 3L/min O2 via [place] which is pt's baseline supplemental O2 requirement. Cleared with RN who noted pt with no observed or reported swallowing difficult.  Recommendations for follow up therapy are one component of a multi-disciplinary discharge planning process, led by the attending physician.  Recommendations may be updated based on patient status, additional functional criteria and insurance authorization. Assessment / Plan / Recommendation   09/09/2021   3:00 PM Clinical Impressions Clinical Impression Pt presents with adequate oropharyngeal abilities when consuming thin liquids via cup and straw; puree and whole barium tablet with thin liquids via straw. Pt  did become "winded" when consuming 4 consecutive sips of thin liquids via straw. Pt states understanding of pacing herself when consuming POs. At this time, current diet appears appropriate. No further skilled ST intervention is indicated. SLP Visit Diagnosis Dysphagia, unspecified (R13.10) Impact on safety and function No limitations     09/09/2021   3:00 PM Treatment Recommendations Treatment Recommendations No treatment recommended at this time     09/08/2021   1:53 PM Prognosis Prognosis for Safe Diet Advancement Good Barriers to Reach Goals --   09/09/2021   3:00 PM Diet Recommendations SLP Diet Recommendations Regular solids;Thin liquid Liquid Administration via Straw;Cup Medication Administration Whole meds with liquid Compensations Minimize environmental distractions;Slow rate;Small sips/bites Postural Changes Seated upright at 90 degrees     09/09/2021   3:00 PM Other Recommendations Oral Care Recommendations Oral care BID Follow Up Recommendations No SLP follow up Assistance recommended at discharge None Functional Status Assessment Patient has not had a recent decline in their functional status   09/08/2021  1:53 PM Frequency and Duration  Speech Therapy Frequency (ACUTE ONLY) min 1 x/week Treatment Duration 1 week     09/09/2021   3:00 PM Oral Phase Oral Phase [facility]    09/09/2021   3:00 PM Pharyngeal Phase Pharyngeal Phase [facility]    09/09/2021   3:00 PM Cervical Esophageal Phase  Cervical Esophageal Phase [facility] Happi B. Rutherford Nail M.S., CCC-SLP, CBIS Speech-Language Pathologist Certified Brain Injury [place]  [place] Office [phone] Ascom [phone] Fax [phone]                     CT CHEST WO CONTRAST  Result Date: 09/08/2021 CLINICAL DATA:  Shortness of breath.  Pneumonia. EXAM: CT CHEST WITHOUT CONTRAST TECHNIQUE: Multidetector CT imaging of the chest was performed following the standard protocol without IV contrast. RADIATION DOSE REDUCTION: This  exam was performed according to the departmental dose-optimization program which includes automated exposure control, adjustment of the mA and/or kV according to patient size and/or use of iterative reconstruction technique. COMPARISON:  07/22/2021 FINDINGS: Cardiovascular: The heart size is normal. No substantial pericardial effusion. Coronary artery calcification is evident. Mild atherosclerotic calcification is noted in the wall of the thoracic aorta. Enlargement of the pulmonary outflow tract/main pulmonary arteries suggests pulmonary arterial hypertension. Mediastinum/Nodes: Scattered small mediastinal lymph nodes evident. No evidence for gross hilar lymphadenopathy although assessment is limited by the lack of intravenous contrast on the current study. The esophagus has normal imaging features. There is no axillary lymphadenopathy. Lungs/Pleura: Patchy ground-glass airspace disease in the right upper lobe and posterior left upper lobe is new in the interval, likely infectious/inflammatory. There is new consolidative airspace disease in both lower lobes which may be in part related to atelectasis although consolidative pneumonia, notably in the right lower lobe, is suspected. Small bilateral pleural effusions evident. Upper Abdomen: Unremarkable. Musculoskeletal: No worrisome lytic or sclerotic osseous abnormality. Status post left shoulder reverse arthroplasty. Fixation hardware noted right shoulder. Incomplete visualization of lumbar fusion hardware. Multiple nonacute right rib fractures evident IMPRESSION: 1. Interval development of patchy ground-glass airspace disease in the right upper lobe and posterior left upper lobe, likely infectious/inflammatory. 2. Interval development of consolidative airspace disease in both lower lobes which may be in part related to atelectasis although consolidative pneumonia, notably in the right lower lobe, is suspected. 3. Small bilateral pleural effusions. 4. Enlargement of  the pulmonary outflow tract/main pulmonary arteries suggests pulmonary arterial hypertension. 5. Aortic Atherosclerosis (ICD10-I70.0). Electronically Signed   By: Misty Stanley M.D.   On: 09/08/2021 09:49   DG Foot Complete Right  Result Date: 09/08/2021 CLINICAL DATA:  Right foot pain after fall last Thursday. Unable to ambulate. EXAM: RIGHT FOOT COMPLETE - 3+ VIEW COMPARISON:  Right ankle x-rays dated January 03, 2015. FINDINGS: Acute minimally impacted fracture at the base of the fifth proximal phalanx. No additional fracture. No dislocation. Postsurgical changes of the first and third metatarsals. Mild degenerative changes of the forefoot. Osteopenia. Dorsal forefoot soft tissue swelling. IMPRESSION: 1. Acute minimally impacted fracture at the base of the fifth proximal phalanx. Electronically Signed   By: Titus Dubin M.D.   On: 09/08/2021 08:46   DG Hip Unilat W or Wo Pelvis 2-3 Views Right  Result Date: 09/08/2021 CLINICAL DATA:  Right hip pain after fall last Thursday. Unable to ambulate. EXAM: DG HIP (WITH OR WITHOUT PELVIS) 2-3V RIGHT COMPARISON:  CT abdomen pelvis dated February 10, 2021. FINDINGS: No acute fracture or dislocation. Bilateral hip arthroplasties  and lumbar fusion. No evidence of hardware failure or loosening. Soft tissues are unremarkable. IMPRESSION: 1. No acute osseous abnormality. Electronically Signed   By: Titus Dubin M.D.   On: 09/08/2021 08:42   DG Chest Portable 1 View  Result Date: 09/08/2021 CLINICAL DATA:  Shortness of breath. EXAM: PORTABLE CHEST 1 VIEW COMPARISON:  08/20/2021 FINDINGS: Markedly rotated film. The cardio pericardial silhouette is enlarged. Bibasilar collapse/consolidation with small bilateral pleural effusions. There is pulmonary vascular congestion without overt pulmonary edema. Status post bilateral shoulder replacement. Telemetry leads overlie the chest. IMPRESSION: Markedly rotated film with bibasilar collapse/consolidation and small bilateral  pleural effusions. Electronically Signed   By: Misty Stanley M.D.   On: 09/08/2021 07:00   DG Chest Portable 1 View  Result Date: 08/20/2021 CLINICAL DATA:  Cough.  Shortness of breath for 1 week. EXAM: PORTABLE CHEST 1 VIEW COMPARISON:  07/28/2021 FINDINGS: Airway thickening is present, suggesting bronchitis or reactive airways disease. Healing fractures the right posterolateral fifth, sixth, and seventh ribs with callus formation noted. There also anterior healing fractures of the right fourth and fifth ribs. Late phase healing fracture the left posterolateral third rib. Review of current in recent prior exams reveals a healing fracture at the base of the right coracoid. Heart size within normal limits for AP projection. No blunting of the costophrenic angles. IMPRESSION: 1. Airway thickening is present, suggesting bronchitis or reactive airways disease. 2. Healing fracture the right coracoid base. Late phase healing fractures of right and left ribs. Electronically Signed   By: Van Clines M.D.   On: 08/20/2021 09:01    Microbiology: Results for orders placed or performed during the hospital encounter of 09/08/21  SARS Coronavirus 2 by RT PCR (hospital order, performed in [facility] hospital lab) *cepheid single result test* Anterior Nasal Swab     Status: None   Collection Time: 09/08/21  6:08 AM   Specimen: Anterior Nasal Swab  Result Value Ref Range Status   SARS Coronavirus 2 by RT PCR NEGATIVE NEGATIVE Final    Comment: (NOTE) SARS-CoV-2 target nucleic acids are NOT DETECTED.  The SARS-CoV-2 RNA is generally detectable in upper and lower respiratory specimens during the acute phase of infection. The lowest concentration of SARS-CoV-2 viral copies this assay can detect is 250 copies / mL. A negative result does not preclude SARS-CoV-2 infection and should not be used as the sole basis for treatment or other patient management decisions.  A negative result may occur with improper  specimen collection / handling, submission of specimen other than nasopharyngeal swab, presence of viral mutation(s) within the areas targeted by this assay, and inadequate number of viral copies (<250 copies / mL). A negative result must be combined with clinical observations, patient history, and epidemiological information.  Fact Sheet for Patients:   https://www.patel.info/  Fact Sheet for Healthcare Providers: https://hall.com/  This test is not yet approved or  cleared by the [place] FDA and has been authorized for detection and/or diagnosis of SARS-CoV-2 by FDA under an Emergency Use Authorization (EUA).  This EUA will remain in effect (meaning this test can be used) for the duration of the COVID-19 declaration under Section 564(b)(1) of the Act, 21 U.S.C. section 360bbb-3(b)(1), unless the authorization is terminated or revoked sooner.  Performed at [facility], [address] [place], [place] [address]   Urine Culture     Status: None   Collection Time: 09/08/21  7:26 AM   Specimen: Urine, Random  Result Value Ref Range  Status   Specimen Description   Final    URINE, RANDOM Performed at [facility], [address] [place], [place] [address]    Special Requests   Final    NONE Performed at [facility], [address] [place], [place] [address]    Culture   Final    NO GROWTH Performed at [facility], [place] [address] [place], [place] [address]    Report Status 09/09/2021 FINAL  Final  Blood culture (routine x 2)     Status: None (Preliminary result)   Collection Time: 09/08/21  8:36 AM   Specimen: BLOOD  Result Value Ref Range Status   Specimen Description BLOOD RIGHT ANTECUBITAL  Final   Special Requests   Final    BOTTLES DRAWN AEROBIC AND ANAEROBIC Blood Culture results may not be optimal due to an inadequate volume of blood received in culture bottles   Culture   Final     NO GROWTH 4 DAYS Performed at [facility], [address], [place], [place] [address]    Report Status PENDING  Incomplete  Blood culture (routine x 2)     Status: None (Preliminary result)   Collection Time: 09/08/21  8:36 AM   Specimen: BLOOD  Result Value Ref Range Status   Specimen Description BLOOD LEFT ANTECUBITAL  Final   Special Requests   Final    BOTTLES DRAWN AEROBIC AND ANAEROBIC Blood Culture results may not be optimal due to an inadequate volume of blood received in culture bottles   Culture   Final    NO GROWTH 4 DAYS Performed at [facility], [address] [place], [place] [address]    Report Status PENDING  Incomplete  Respiratory (~20 pathogens) panel by PCR     Status: None   Collection Time: 09/09/21  2:30 PM   Specimen: Nasopharyngeal Swab; Respiratory  Result Value Ref Range Status   Adenovirus NOT DETECTED NOT DETECTED Final   Coronavirus 229E NOT DETECTED NOT DETECTED Final    Comment: (NOTE) The Coronavirus on the Respiratory Panel, DOES NOT test for the novel  Coronavirus (2019 nCoV)    Coronavirus HKU1 NOT DETECTED NOT DETECTED Final   Coronavirus NL63 NOT DETECTED NOT DETECTED Final   Coronavirus OC43 NOT DETECTED NOT DETECTED Final   Metapneumovirus NOT DETECTED NOT DETECTED Final   Rhinovirus / Enterovirus NOT DETECTED NOT DETECTED Final   Influenza A NOT DETECTED NOT DETECTED Final   Influenza B NOT DETECTED NOT DETECTED Final   Parainfluenza Virus 1 NOT DETECTED NOT DETECTED Final   Parainfluenza Virus 2 NOT DETECTED NOT DETECTED Final   Parainfluenza Virus 3 NOT DETECTED NOT DETECTED Final   Parainfluenza Virus 4 NOT DETECTED NOT DETECTED Final   Respiratory Syncytial Virus NOT DETECTED NOT DETECTED Final   Bordetella pertussis NOT DETECTED NOT DETECTED Final   Bordetella Parapertussis NOT DETECTED NOT DETECTED Final   Chlamydophila pneumoniae NOT DETECTED NOT DETECTED Final   Mycoplasma pneumoniae NOT DETECTED NOT  DETECTED Final    Comment: Performed at [facility], [place] [address], [place], [place] [address]  Expectorated Sputum Assessment w Gram Stain, Rflx to Resp Cult     Status: None   Collection Time: 09/10/21 12:20 PM   Specimen: Expectorated Sputum  Result Value Ref Range Status   Specimen Description EXPECTORATED SPUTUM  Final   Special Requests NONE  Final   Sputum evaluation   Final    THIS SPECIMEN IS  ACCEPTABLE FOR SPUTUM CULTURE Performed at [facility], [address] [place], [place] [address]    Report Status 09/10/2021 FINAL  Final  Culture, Respiratory w Gram Stain     Status: None (Preliminary result)   Collection Time: 09/10/21 12:20 PM  Result Value Ref Range Status   Specimen Description   Final    EXPECTORATED SPUTUM Performed at [facility], [address], [place], [place] [address]    Special Requests   Final    NONE Reflexed from [id number] Performed at [facility], [address] [place], [place] [address]    Gram Stain   Final    RARE WBC PRESENT, PREDOMINANTLY PMN NO ORGANISMS SEEN    Culture   Final    RARE YEAST IDENTIFICATION TO FOLLOW Performed at [facility], [place] [address] [place], [place] [address]    Report Status PENDING  Incomplete    Labs: CBC: Recent Labs  Lab 09/08/21 0608 09/09/21 0447 09/10/21 0752 09/11/21 0638 09/12/21 0645  WBC 18.1* 17.0* 17.1* 17.1* 15.7*  NEUTROABS 11.7*  --   --   --   --   HGB 8.8* 6.9* 9.4* 8.8* 9.6*  HCT 30.8* 23.2* 29.5* 27.5* 31.1*  MCV 80.4 75.6* 77.4* 78.3* 79.3*  PLT 265 231 287 276 536   Basic Metabolic Panel: Recent Labs  Lab 09/08/21 0608 09/09/21 0447 09/10/21 0752 09/11/21 0638 09/12/21 0645  NA 139 139 139 141 141  K 4.3 4.1 4.2 3.9 3.7  CL 106 104 101 101 104  CO2 '24 26 27 30 29  '$ GLUCOSE 159* 141* 208* 201* 127*  BUN 19 23 31* 35* 29*  CREATININE 1.34* 1.39* 1.44* 1.37* 1.18*  CALCIUM 8.4* 8.1* 7.8* 7.7* 7.7*  MG  --   --  2.1 2.5*  2.5*   Liver Function Tests: Recent Labs  Lab 09/08/21 0608 09/11/21 0638  AST 24 25  ALT 16 13  ALKPHOS 98 80  BILITOT 0.6 0.7  PROT 6.7 6.2*  ALBUMIN 3.3* 3.0*   CBG: No results for input(s): "GLUCAP" in the last 168 hours.  Discharge time spent: approximately 35 minutes spent on discharge counseling, evaluation of patient on day of discharge, and coordination of discharge planning with nursing, social work, pharmacy and case management  Signed: Edwin Dada, MD [facility] 09/12/2021

## 2021-09-12 NOTE — NC FL2 (Signed)
[place] LEVEL OF CARE SCREENING TOOL     IDENTIFICATION  Patient Name: Kerri Carter Birthdate: 1940-12-20 Sex: female Admission Date (Current Location): 09/08/2021  [facility] and [place] Number:  Engineering geologist and Address:  [facility], [address], [place], [place] [address]      Provider Number: [id number]  Attending Physician Name and Address:  Edwin Dada, *  Relative Name and Phone Number:  Marlou Sa (son) [phone]    Current Level of Care: Hospital Recommended Level of Care: [place] Prior Approval Number:    Date Approved/Denied:   PASRR Number: [id number]  Discharge Plan: SNF    Current Diagnoses: Patient Active Problem List   Diagnosis Date Noted   Acute on chronic respiratory failure with hypoxia ([place]) 09/10/2021   Toe fracture, right 09/09/2021   Closed nondisplaced fracture of proximal phalanx of lesser toe of right foot    Recurrent falls 09/01/2021   COPD with exacerbation ([place]) 08/20/2021   Acute on chronic diastolic CHF (congestive heart failure) ([place]) 08/20/2021   Paroxysmal atrial fibrillation ([place]) 07/29/2021   Acute on chronic combined systolic (congestive) and diastolic (congestive) heart failure ([place]) 07/28/2021   Stage 3a chronic kidney disease (CKD) ([place]) 07/23/2021   Recurrent pneumonia 07/23/2021   Community acquired pneumonia 07/22/2021   Sepsis due to pneumonia ([place]) 07/22/2021   Lymphocytosis 07/13/2021   Thrombocytosis 06/24/2021   Leukopenia 06/24/2021   Abnormal thyroid function test 06/24/2021   Acute on chronic combined systolic and diastolic CHF (congestive heart failure) ([place]) 05/26/2021   Left lower lobe pneumonia 05/26/2021   Elevated d-dimer 05/26/2021   Acute kidney injury superimposed on chronic kidney disease ([place]) 05/26/2021   Hyperkalemia 05/26/2021   Anxiety and depression 05/26/2021   S/P lumbar fusion 05/23/2021   Physical deconditioning  02/27/2021   Leg pain, bilateral    Respiratory failure with hypoxia ([place]) 01/12/2021   Frequent falls 12/18/2020   Hip pain 12/18/2020   Acute pain of left knee 12/18/2020   Elevated brain natriuretic peptide (BNP) level 12/18/2020   Weakness of both lower extremities 12/18/2020   Assistance needed for mobility 12/18/2020   Impaired mobility 12/18/2020   Urinary incontinence 12/18/2020   Pleural effusion on left 12/18/2020   Lumbar radiculopathy 12/11/2020   Acromial process of scapula fracture 12/21/2019   NSIP (nonspecific interstitial pneumonitis) ([place]) 12/08/2019   Iliopsoas bursitis of right hip 08/29/2019   Chronic left shoulder pain 08/29/2019   Overweight (BMI 25.0-29.9) 08/29/2019   Hypertriglyceridemia 07/24/2019   Bipolar disorder, in full remission, most recent episode depressed ([place]) 07/13/2019   Essential hypertension 06/16/2019   Chronic pain 06/16/2019   Lymphedema 06/05/2019   Chronic venous insufficiency 05/25/2019   PAD (peripheral artery disease) ([place]) 05/25/2019   Leg edema 05/02/2019   Episodic mood disorder ([place]) 67/01/4579   Complicated grief 99/83/3825   At high risk for falls 03/16/2019   Pelvic mass in female 03/16/2019   Compression fracture of L4 vertebra ([place]) 02/21/2019   Collapsed vertebra, not elsewhere classified, lumbar region, initial encounter for fracture ([place]) 02/21/2019   Anxiety 01/20/2019   Pain due to onychomycosis of toenails of both feet 12/05/2018   Hav (hallux abducto valgus), unspecified laterality 12/05/2018   Closed fracture of acromial process of scapula 10/28/2018   Status post reverse arthroplasty of shoulder, left 07/26/2018   Interstitial pulmonary disease ([place]) 07/07/2018   Fatigue 07/07/2018   Avascular necrosis of left shoulder due to adverse effect of steroid  therapy ([place]) 01/20/2018   Other shoulder lesions, left shoulder 12/20/2017   Status post reverse total shoulder replacement, right 11/04/2017   Leukocytosis  10/19/2017   Allergic rhinitis 09/28/2017   Rotator cuff tendinitis, right 07/26/2017   Strain of right hip 02/26/2017   History of fracture of left hip 01/15/2017   Status post lumbar spinal fusion 01/15/2017   Dislocation of hip joint prosthesis ([place]) 01/08/2017   Status post hip hemiarthroplasty 01/08/2017   Leg swelling 12/15/2016   Age-related osteoporosis without current pathological fracture 11/05/2016   Hip fracture ([place]) 10/31/2016   Peripheral neuropathy 10/28/2016   Left foot pain 10/19/2016   Spinal stenosis of lumbar region 09/15/2016   Osteoporosis 08/27/2016   Drug-induced osteoporosis 08/27/2016   Adnexal mass 08/25/2016   Prediabetes 08/25/2016   Coronary atherosclerosis 08/25/2016   History of syncope 08/25/2016   Hypokalemia 08/25/2016   Mixed bipolar I disorder ([place]) 10/09/2015   Stage 3b chronic kidney disease (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   Macular degeneration 07/17/2015   Insomnia 07/17/2015   Rheumatoid arthritis ([place]) 12/05/2014   Other specified postprocedural states 07/20/2013   Osteoarthritis of knee 06/07/2013   Cough 05/02/2012   SOB (shortness of breath) 10/13/2011   Valvular heart disease 10/13/2011   Chronic obstructive pulmonary disease (COPD) ([place]) 09/09/2011   OSA (obstructive sleep apnea) 09/09/2011   Nocturnal hypoxemia 09/09/2011   Gastroesophageal reflux disease 02/24/2011   Single kidney 02/24/2011   H/O cardiac catheterization 02/24/2011   Renal artery stenosis ([place]) 02/24/2011    Orientation RESPIRATION BLADDER Height & Weight     Self, Time, Situation, Place  O2 (3L nasal cannula) Incontinent, External catheter Weight: 177 lb 0.5 oz (80.3 kg) Height:  '5\' 1"'$  (154.9 cm)  BEHAVIORAL SYMPTOMS/MOOD NEUROLOGICAL BOWEL NUTRITION STATUS      Continent Diet (see discharge summary)  AMBULATORY STATUS  COMMUNICATION OF NEEDS Skin   Limited Assist Verbally Normal                       Personal Care Assistance Level of Assistance  Bathing, Feeding, Dressing, Total care Bathing Assistance: Limited assistance Feeding assistance: Independent Dressing Assistance: Limited assistance Total Care Assistance: Limited assistance   Functional Limitations Info  Sight, Hearing, Speech Sight Info: Impaired Hearing Info: Adequate Speech Info: Adequate    SPECIAL CARE FACTORS FREQUENCY  PT (By licensed PT), OT (By licensed OT)     PT Frequency: min 4x weekly OT Frequency: min 4x weekly            Contractures Contractures Info: Not present    Additional Factors Info  Code Status, Allergies Code Status Info: DNR Allergies Info: Ceftin (Cefuroxime Axetil)   Lisinopril   Sulfa Antibiotics   Sulfasalazine   Morphine   Xarelto (Rivaroxaban)   Adhesive (Tape)   Antihistamines, Chlorpheniramine-type   Antivert (Meclizine Hcl)   Aspirin   Contrast Media (Iodinated Contrast Media)   Decongestant (Pseudoephedrine Hcl)   Doxycycline   Levaquin (Levofloxacin In D5w)   Polymyxin B   Tetanus Toxoids           Current Medications (09/12/2021):  This is the current hospital active medication list Current Facility-Administered Medications  Medication Dose Route Frequency Provider Last Rate Last Admin   acetaminophen (TYLENOL) tablet 650 mg  650 mg Oral Q6H PRN Agbata, Tochukwu, MD   650 mg  at 09/12/21 4008   Or   acetaminophen (TYLENOL) suppository 650 mg  650 mg Rectal Q6H PRN Agbata, Tochukwu, MD       albuterol (PROVENTIL) (2.5 MG/3ML) 0.083% nebulizer solution 2.5 mg  2.5 mg Nebulization Q2H PRN Danford, Suann Larry, MD       amLODipine (NORVASC) tablet 5 mg  5 mg Oral Daily Agbata, Tochukwu, MD   5 mg at 09/11/21 1035   Ampicillin-Sulbactam (UNASYN) 3 g in sodium chloride 0.9 % 100 mL IVPB  3 g Intravenous Q8H Enzo Bi, MD 200 mL/hr at 09/12/21 0834 3 g at 09/12/21 0834   apixaban  (ELIQUIS) tablet 5 mg  5 mg Oral BID Agbata, Tochukwu, MD   5 mg at 09/11/21 2243   budesonide (PULMICORT) nebulizer solution 0.25 mg  0.25 mg Nebulization BID Agbata, Tochukwu, MD   0.25 mg at 09/12/21 0740   busPIRone (BUSPAR) tablet 5 mg  5 mg Oral BID Agbata, Tochukwu, MD   5 mg at 09/11/21 2242   dicyclomine (BENTYL) capsule 20 mg  20 mg Oral BID Agbata, Tochukwu, MD   20 mg at 09/11/21 2243   escitalopram (LEXAPRO) tablet 10 mg  10 mg Oral Daily Agbata, Tochukwu, MD   10 mg at 09/11/21 1035   ferric gluconate (FERRLECIT) 250 mg in sodium chloride 0.9 % 250 mL IVPB  250 mg Intravenous Daily Edwin Dada, MD   Stopped at 09/11/21 2300   gabapentin (NEURONTIN) capsule 600 mg  600 mg Oral BID Agbata, Tochukwu, MD   600 mg at 09/11/21 2242   guaiFENesin (MUCINEX) 12 hr tablet 600 mg  600 mg Oral BID Foust, Katy L, NP   600 mg at 09/11/21 2242   ipratropium-albuterol (DUONEB) 0.5-2.5 (3) MG/3ML nebulizer solution 3 mL  3 mL Nebulization BID Edwin Dada, MD   3 mL at 09/12/21 0740   lamoTRIgine (LAMICTAL) tablet 100 mg  100 mg Oral BID Agbata, Tochukwu, MD   100 mg at 09/11/21 2242   leflunomide (ARAVA) tablet 20 mg  20 mg Oral Daily Agbata, Tochukwu, MD   20 mg at 09/11/21 1039   methocarbamol (ROBAXIN) tablet 500 mg  500 mg Oral QID PRN Agbata, Tochukwu, MD   500 mg at 09/12/21 0829   methylPREDNISolone sodium succinate (SOLU-MEDROL) 40 mg/mL injection 40 mg  40 mg Intravenous Daily Danford, Suann Larry, MD   40 mg at 09/11/21 1039   mirabegron ER (MYRBETRIQ) tablet 50 mg  50 mg Oral Daily Agbata, Tochukwu, MD   50 mg at 09/11/21 1038   montelukast (SINGULAIR) tablet 10 mg  10 mg Oral Daily Enzo Bi, MD   10 mg at 09/11/21 1048   multivitamin-lutein (OCUVITE-LUTEIN) capsule 1 capsule  1 capsule Oral Daily Agbata, Tochukwu, MD   1 capsule at 09/11/21 1037   nebivolol (BYSTOLIC) tablet 5 mg  5 mg Oral Daily Agbata, Tochukwu, MD   5 mg at 09/11/21 1039   ondansetron (ZOFRAN)  tablet 4 mg  4 mg Oral Q6H PRN Agbata, Tochukwu, MD       Or   ondansetron (ZOFRAN) injection 4 mg  4 mg Intravenous Q6H PRN Agbata, Tochukwu, MD       pantoprazole (PROTONIX) EC tablet 20 mg  20 mg Oral BID Agbata, Tochukwu, MD   20 mg at 09/11/21 2243   pravastatin (PRAVACHOL) tablet 20 mg  20 mg Oral q1800 Agbata, Tochukwu, MD   20 mg at 09/11/21 1707   QUEtiapine (SEROQUEL) tablet 25 mg  25 mg Oral QHS Agbata, Tochukwu, MD   25 mg at 09/11/21 2243   sucralfate (CARAFATE) tablet 1 g  1 g Oral BID Agbata, Tochukwu, MD   1 g at 09/11/21 2243   torsemide (DEMADEX) tablet 20 mg  20 mg Oral Daily Danford, Suann Larry, MD   20 mg at 09/11/21 1055     Discharge Medications: Please see discharge summary for a list of discharge medications.  Relevant Imaging Results:  Relevant Lab Results:   Additional Information [id number]  Alberteen Sam, LCSW

## 2021-09-12 NOTE — TOC Transition Note (Addendum)
Transition of Care [facility]) - CM/SW Discharge Note   Patient Details  Name: Kerri Carter MRN: [id number] Date of Birth: [birth year]  Transition of Care [facility]) CM/SW Contact:  Alberteen Sam, LCSW Phone Number: 09/12/2021, 11:02 AM   Clinical Narrative:     Patient and family agreeable to home with [facility] PT OT aide and social worker through [place], [place] with [place] informed of dc today. Patient's grandson to pick patient up this afternoon.   Patient and son agreeable for Care Patrol referral to start discussion of future long term options, he reports patient does not qualify for mediciad. Care patrol to follow up.   No further dc needs.     Final next level of care: [place] Barriers to Discharge: No Barriers Identified   Patient Goals and CMS Choice Patient states their goals for this hospitalization and ongoing recovery are:: to go home CMS Medicare.gov Compare Post Acute Care list provided to:: Patient Choice offered to / list presented to : Patient  Discharge Placement                       Discharge Plan and Services                          HH Arranged: PT, OT, Nurse's Aide [place] Agency: [place] Date [facility] Agency Contacted: 09/10/21 Time [place]: 1011 Representative spoke with at [place]: [place]  Social Determinants of Health ([place]) Interventions     Readmission Risk Interventions    08/21/2021    3:19 PM 05/24/2021    2:35 PM  Readmission Risk Prevention Plan  Transportation Screening Complete Complete  PCP or Specialist Appt within 3-5 Days  Complete  HRI or [place]  Complete  Social Work Consult for [place] Planning/Counseling  Complete  Palliative Care Screening  Not Applicable  Medication Review Press photographer) Complete Complete  HRI or [place] Complete   SW Recovery Care/Counseling Consult Complete   [place] Not Applicable

## 2021-09-13 LAB — CULTURE, BLOOD (ROUTINE X 2)
Culture: NO GROWTH
Culture: NO GROWTH

## 2021-09-13 LAB — CULTURE, RESPIRATORY W GRAM STAIN

## 2021-09-15 ENCOUNTER — Telehealth: Payer: Self-pay

## 2021-09-15 ENCOUNTER — Other Ambulatory Visit: Payer: Self-pay | Admitting: *Deleted

## 2021-09-15 NOTE — Patient Outreach (Addendum)
  Care Coordination [facility] Note Transition Care Management Follow-up Telephone Call Date of discharge and from where: 09/12/21 How have you been since you were released from the hospital? Patient states she is feeling OK. Any questions or concerns? No  Items Reviewed: Did the pt receive and understand the discharge instructions provided? Yes  Medications obtained and verified? No , patient states [facility] is getting a clarification for the prednisone prescription. Patient will call this nurse back in she is unable to get the prescription. Other? No  Any new allergies since your discharge? No  Dietary orders reviewed? Yes Do you have support at home? Yes   Home Care and Equipment/Supplies: Were home health services ordered? yes If so, what is the name of the agency? [facility]  Has the agency set up a time to come to the patient's home? No, patient states she called and told them she was home. Were any new equipment or medical supplies ordered?  No What is the name of the medical supply agency? N/A Were you able to get the supplies/equipment? not applicable Do you have any questions related to the use of the equipment or supplies? No  Functional Questionnaire: (I = Independent and D = Dependent) ADLs: I  Bathing/Dressing- I  Meal Prep- D  Eating- I  Maintaining continence- I  Transferring/Ambulation- I  Managing Meds- I  Follow up appointments reviewed:  PCP Hospital f/u appt confirmed? Yes  Scheduled to see Dr. Aundra Dubin on 09/24/21 @ 1340. [place] [facility] f/u appt confirmed? No  , patient states she will call her Pulmonologist and make an appointment. Are transportation arrangements needed? No  If their condition worsens, is the pt aware to call PCP or go to the Emergency Dept.? Yes Was the patient provided with contact information for the PCP's office or ED? Yes Was to pt encouraged to call back with questions or concerns? Yes  SDOH assessments and interventions completed:    Yes  Care Coordination Interventions Activated:  No   Care Coordination Interventions:   N/A     Encounter Outcome:  Pt. Visit Completed    Emelia Loron RN, BSN [place] [phone] [email or web address]

## 2021-09-15 NOTE — Telephone Encounter (Signed)
Transition Care Management Unsuccessful Follow-up Telephone Call  Date of discharge and from where:  09/12/21 [facility]  Attempts:  1st Attempt  Reason for unsuccessful TCM follow-up call:  Unable to reach patient

## 2021-09-16 NOTE — Telephone Encounter (Signed)
Transition Care Management Unsuccessful Follow-up Telephone Call  Date of discharge and from where:  09/12/21 [facility]  Attempts:  2nd Attempt  Reason for unsuccessful TCM follow-up call:  Unable to reach patient

## 2021-09-17 NOTE — Telephone Encounter (Signed)
Transition Care Management Unsuccessful Follow-up Telephone Call  Date of discharge and from where:  09/12/21 [facility]  Attempts:  3rd Attempt  Reason for unsuccessful TCM follow-up call:  Unable to leave message. Appointment scheduled 09/24/21 @ 1:40. Keep all scheduled appointments. This closes TCM attempts.

## 2021-09-18 IMAGING — MR MR HEAD W/O CM
12 series · 45 of 48 positions shown · non-contrast
Comparison: Diffusion only brain MRI 03/24/2020. Head CT
03/24/2020. Brain MRI 07/13/2015.

CLINICAL DATA: 80-year-old female with cognitive changes. Reports a
"mini-stroke" earlier this year. Slurred speech.

EXAM:
MRI HEAD WITHOUT CONTRAST
TECHNIQUE: Multiplanar, multiecho pulse sequences of the brain and surrounding
structures were obtained without intravenous contrast.

[Series 5: ax dwi_tracew · axial · 3.0mm · 0.65mm/px · z∈[-110,+44]mm · 3 of 48 slices shown]
[im 1/48]
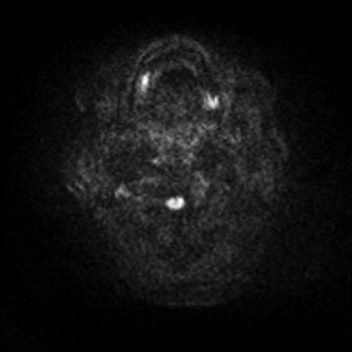
[im 24/48]
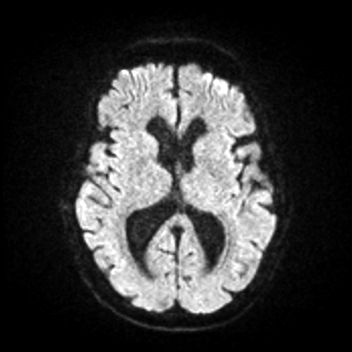
[im 48/48]
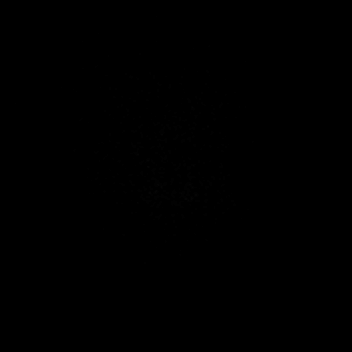

[Series 6: ax dwi_adc · axial · 3.0mm · 0.65mm/px · z∈[-110,+40]mm · 3 of 47 slices shown]
[im 1/47]
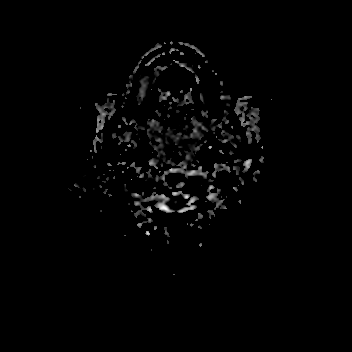
[im 24/47]
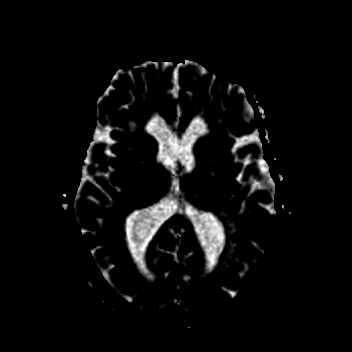
[im 47/47]
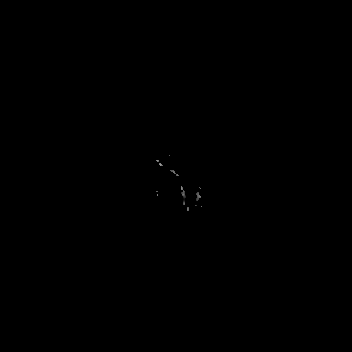

[Series 7: cor dwi_tracew · coronal · 5.0mm · 0.65mm/px · 6 of 80 slices shown]
[im 1/80]
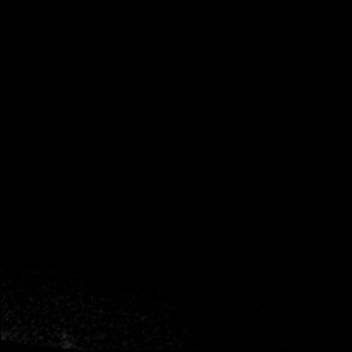
[im 16/80]
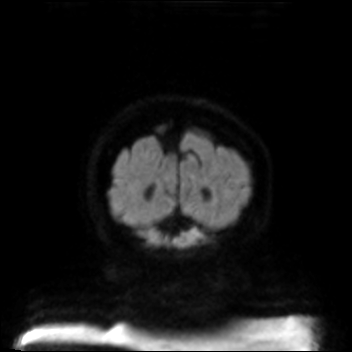
[im 32/80]
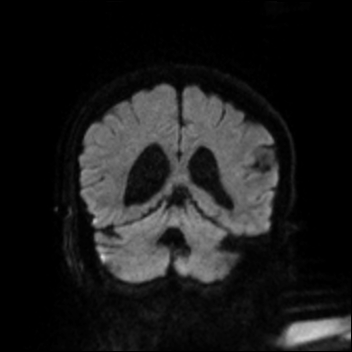
[im 48/80]
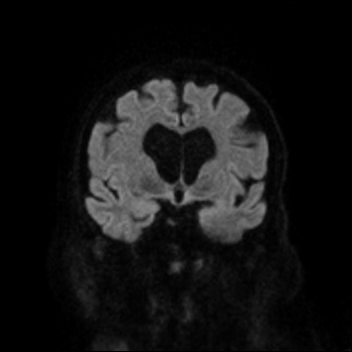
[im 64/80]
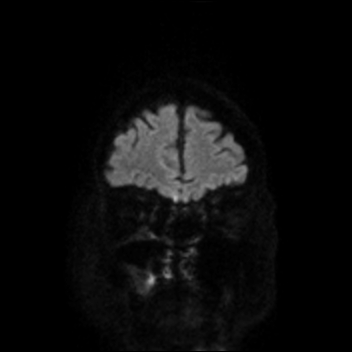
[im 80/80]
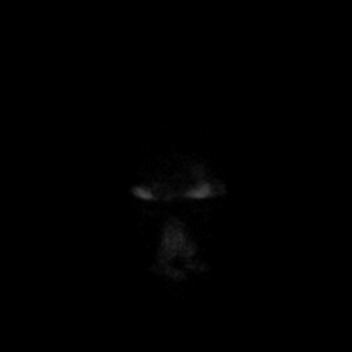

[Series 8: cor dwi_adc · coronal · 5.0mm · 0.65mm/px · 3 of 39 slices shown]
[im 1/39]
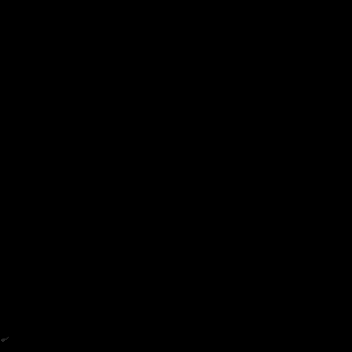
[im 20/39]
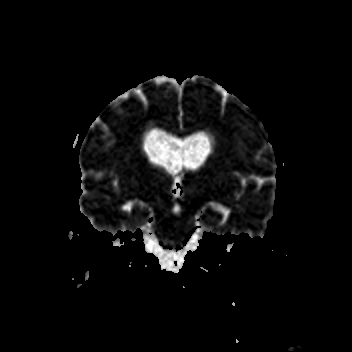
[im 39/39]
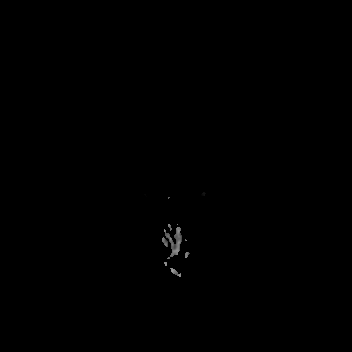

[Series 9: T1 · sagittal · 5.0mm · 0.62mm/px · 1 of 21 slices shown (1 of 2)]
[im 1/21]
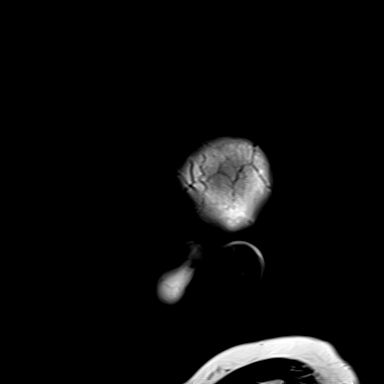

[Series 10: T2 · axial · 5.0mm · 0.53mm/px · z∈[-106,+37]mm · 2 of 25 slices shown (1 of 2)]
[im 1/25]
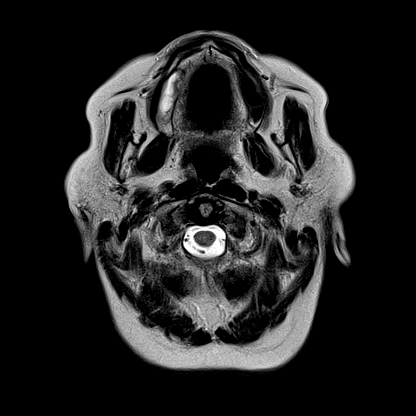
[im 25/25]
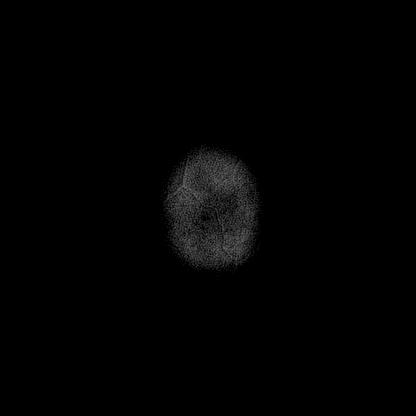

[Series 11: mag_images · axial · 3.0mm · 0.90mm/px · z∈[-122,+54]mm · 4 of 60 slices shown]
[im 1/60]
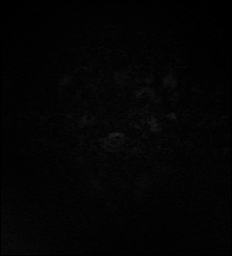
[im 20/60]
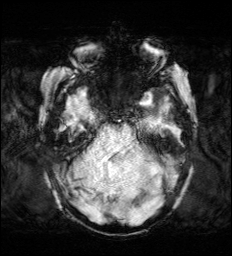
[im 40/60]
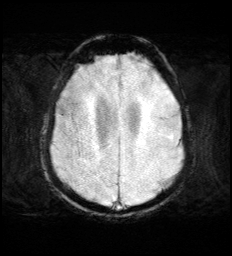
[im 60/60]
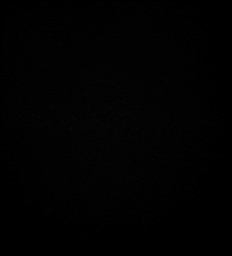

[Series 12: pha_images · axial · 3.0mm · 0.90mm/px · z∈[-122,+54]mm · 4 of 57 slices shown]
[im 1/57]
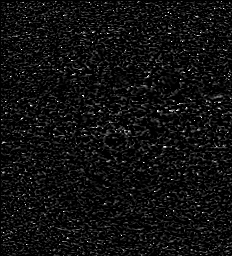
[im 19/57]
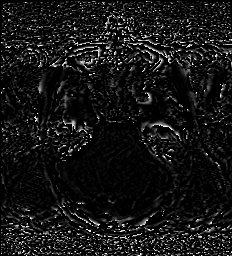
[im 38/57]
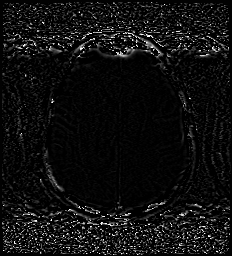
[im 57/57]
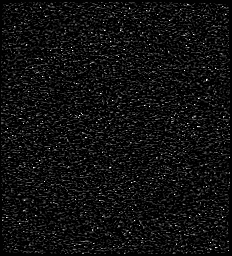

[Series 13: swi_images · axial · 3.0mm · 0.90mm/px · z∈[-122,+54]mm · 4 of 60 slices shown]
[im 1/60]
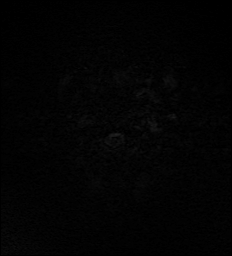
[im 20/60]
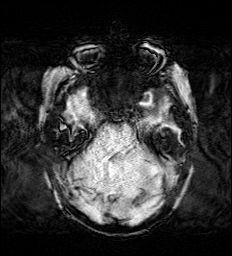
[im 40/60]
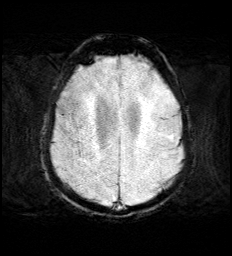
[im 60/60]
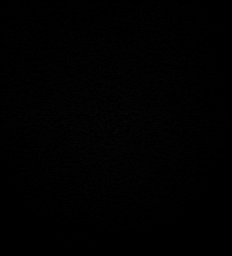

[Series 15: FLAIR · axial · 3.0mm · 0.53mm/px · z∈[-115,+46]mm · 4 of 55 slices shown]
[im 1/55]
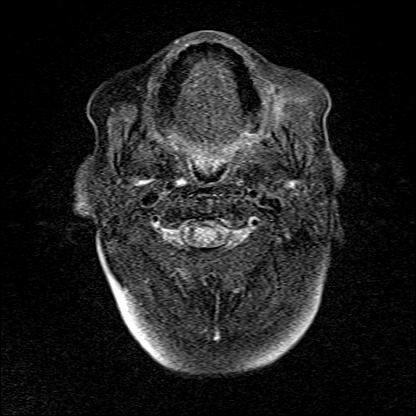
[im 19/55]
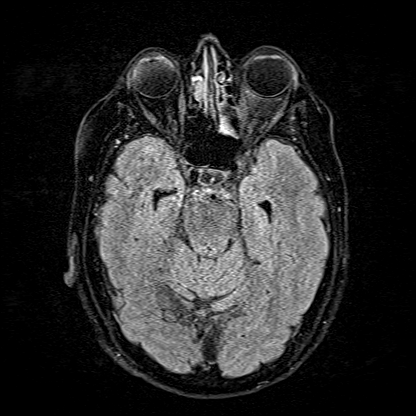
[im 37/55]
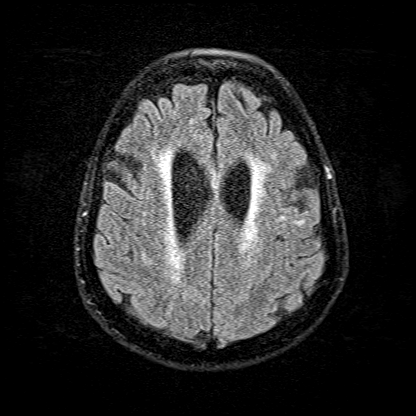
[im 55/55]
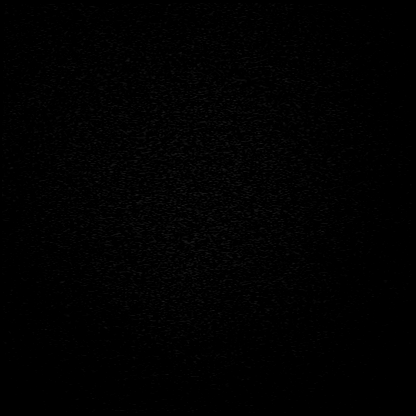

[Series 16: T1 · axial · 1.0mm · 0.98mm/px · z∈[-120,+54]mm · 9 of 176 slices shown (2 of 2)]
[im 1/176]
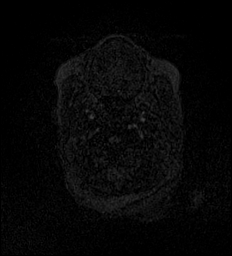
[im 16/176]
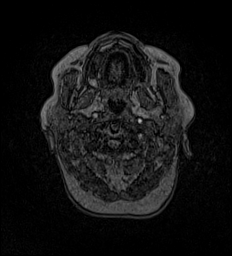
[im 32/176]
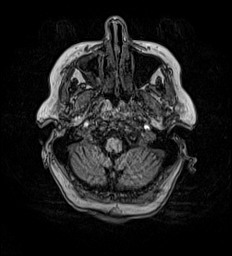
[im 48/176]
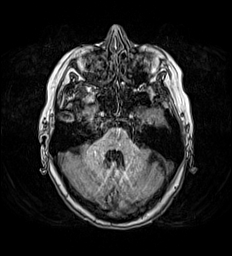
[im 80/176]
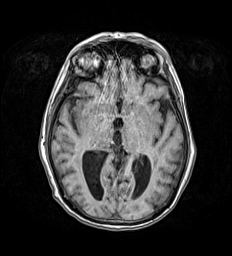
[im 96/176]
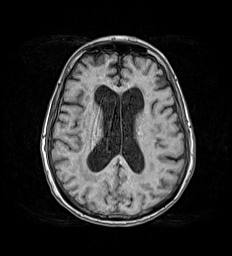
[im 128/176]
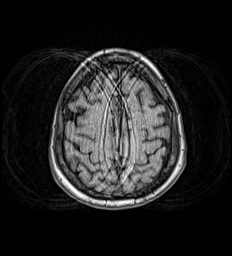
[im 144/176]
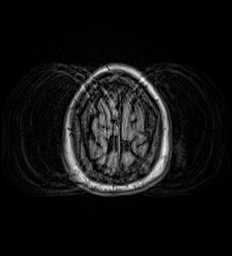
[im 176/176]
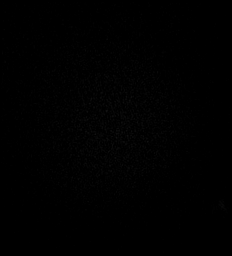

[Series 17: T2 · coronal · 5.0mm · 0.57mm/px · 2 of 29 slices shown (2 of 2)]
[im 1/29]
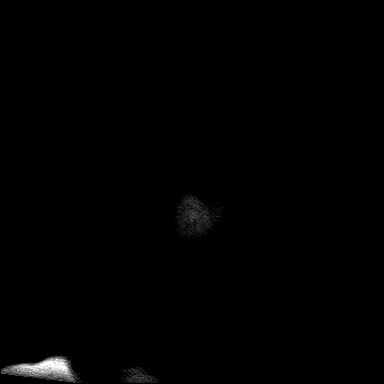
[im 29/29]
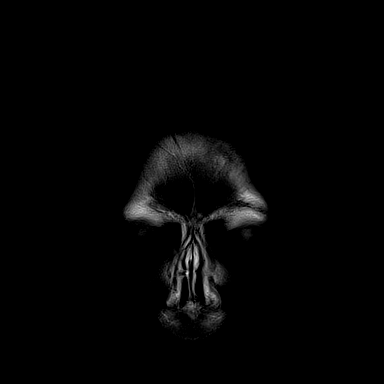

[45 of 48 positions shown; findings below may reference images not displayed]

FINDINGS: Brain: Chronic generalized ventricular prominence is stable to
mildly progressed since 2686, and there may be chronic hyperdynamic
flow at the cerebral aqueduct. No transependymal edema. Temporal
horns remain relatively diminutive (series 15 image 18).

No superimposed restricted diffusion to suggest acute infarction. No
midline shift, mass effect, evidence of mass lesion, extra-axial
collection or acute intracranial hemorrhage. Cervicomedullary
junction and pituitary are within normal limits.

Patchy and confluent bilateral cerebral white matter T2 and FLAIR
hyperintensity has progressed since 2686 but is nonspecific. No
cortical encephalomalacia or chronic cerebral blood products are
identified. Mesial temporal lobe structures appear within normal
limits. Deep gray matter nuclei, brainstem and cerebellum are within
normal limits.

Vascular: Major intracranial vascular flow voids are stable since
2686.

Skull and upper cervical spine: Negative.

Sinuses/Orbits: Postoperative changes to both globes since 2686.
Chronic ethmoid sinus mucosal thickening is stable since that time.
Mild to moderate right maxillary and trace right sphenoid sinus
mucosal thickening has increased. No sinus fluid levels.

Other: Mastoids remain clear. Negative visible scalp and face.
IMPRESSION: 1. Chronic ventriculomegaly is nonspecific. In the appropriate
clinical setting normal pressure hydrocephalus (NPH) can not be
excluded.
2. Progressed cerebral white matter signal changes since 2686,
moderate for age in most commonly due to chronic small vessel
disease.
3. No superimposed acute intracranial abnormality.

## 2021-09-21 DIAGNOSIS — G9009 Other idiopathic peripheral autonomic neuropathy: Secondary | ICD-10-CM

## 2021-09-21 DIAGNOSIS — Z87891 Personal history of nicotine dependence: Secondary | ICD-10-CM

## 2021-09-21 DIAGNOSIS — I2510 Atherosclerotic heart disease of native coronary artery without angina pectoris: Secondary | ICD-10-CM

## 2021-09-21 DIAGNOSIS — Z8711 Personal history of peptic ulcer disease: Secondary | ICD-10-CM

## 2021-09-21 DIAGNOSIS — R32 Unspecified urinary incontinence: Secondary | ICD-10-CM

## 2021-09-21 DIAGNOSIS — I130 Hypertensive heart and chronic kidney disease with heart failure and stage 1 through stage 4 chronic kidney disease, or unspecified chronic kidney disease: Secondary | ICD-10-CM | POA: Diagnosis not present

## 2021-09-21 DIAGNOSIS — E785 Hyperlipidemia, unspecified: Secondary | ICD-10-CM

## 2021-09-21 DIAGNOSIS — K219 Gastro-esophageal reflux disease without esophagitis: Secondary | ICD-10-CM

## 2021-09-21 DIAGNOSIS — Z905 Acquired absence of kidney: Secondary | ICD-10-CM

## 2021-09-21 DIAGNOSIS — F419 Anxiety disorder, unspecified: Secondary | ICD-10-CM

## 2021-09-21 DIAGNOSIS — Z8701 Personal history of pneumonia (recurrent): Secondary | ICD-10-CM

## 2021-09-21 DIAGNOSIS — Z9049 Acquired absence of other specified parts of digestive tract: Secondary | ICD-10-CM

## 2021-09-21 DIAGNOSIS — F319 Bipolar disorder, unspecified: Secondary | ICD-10-CM | POA: Diagnosis not present

## 2021-09-21 DIAGNOSIS — I480 Paroxysmal atrial fibrillation: Secondary | ICD-10-CM

## 2021-09-21 DIAGNOSIS — J9611 Chronic respiratory failure with hypoxia: Secondary | ICD-10-CM | POA: Diagnosis not present

## 2021-09-21 DIAGNOSIS — Z9981 Dependence on supplemental oxygen: Secondary | ICD-10-CM

## 2021-09-21 DIAGNOSIS — M5126 Other intervertebral disc displacement, lumbar region: Secondary | ICD-10-CM | POA: Diagnosis not present

## 2021-09-21 DIAGNOSIS — Z7901 Long term (current) use of anticoagulants: Secondary | ICD-10-CM

## 2021-09-21 DIAGNOSIS — N183 Chronic kidney disease, stage 3 unspecified: Secondary | ICD-10-CM | POA: Diagnosis not present

## 2021-09-21 DIAGNOSIS — H3530 Unspecified macular degeneration: Secondary | ICD-10-CM

## 2021-09-21 DIAGNOSIS — J449 Chronic obstructive pulmonary disease, unspecified: Secondary | ICD-10-CM | POA: Diagnosis not present

## 2021-09-21 DIAGNOSIS — G4733 Obstructive sleep apnea (adult) (pediatric): Secondary | ICD-10-CM

## 2021-09-21 DIAGNOSIS — I739 Peripheral vascular disease, unspecified: Secondary | ICD-10-CM | POA: Diagnosis not present

## 2021-09-21 DIAGNOSIS — I5043 Acute on chronic combined systolic (congestive) and diastolic (congestive) heart failure: Secondary | ICD-10-CM | POA: Diagnosis not present

## 2021-09-21 DIAGNOSIS — Z7902 Long term (current) use of antithrombotics/antiplatelets: Secondary | ICD-10-CM

## 2021-09-21 DIAGNOSIS — M48061 Spinal stenosis, lumbar region without neurogenic claudication: Secondary | ICD-10-CM | POA: Diagnosis not present

## 2021-09-21 DIAGNOSIS — Z96651 Presence of right artificial knee joint: Secondary | ICD-10-CM

## 2021-09-21 DIAGNOSIS — Z9181 History of falling: Secondary | ICD-10-CM

## 2021-09-21 DIAGNOSIS — Z7951 Long term (current) use of inhaled steroids: Secondary | ICD-10-CM

## 2021-09-21 DIAGNOSIS — Z981 Arthrodesis status: Secondary | ICD-10-CM

## 2021-09-22 DIAGNOSIS — J441 Chronic obstructive pulmonary disease with (acute) exacerbation: Secondary | ICD-10-CM | POA: Diagnosis not present

## 2021-09-24 ENCOUNTER — Encounter: Payer: Self-pay | Admitting: Internal Medicine

## 2021-09-24 ENCOUNTER — Ambulatory Visit: Payer: Medicare PPO | Admitting: Internal Medicine

## 2021-09-24 VITALS — BP 118/60 | HR 84 | Temp 98.1°F | Ht 61.0 in | Wt 177.0 lb

## 2021-09-24 DIAGNOSIS — J441 Chronic obstructive pulmonary disease with (acute) exacerbation: Secondary | ICD-10-CM | POA: Diagnosis not present

## 2021-09-24 DIAGNOSIS — R0609 Other forms of dyspnea: Secondary | ICD-10-CM

## 2021-09-24 DIAGNOSIS — J9611 Chronic respiratory failure with hypoxia: Secondary | ICD-10-CM

## 2021-09-24 DIAGNOSIS — I5032 Chronic diastolic (congestive) heart failure: Secondary | ICD-10-CM | POA: Diagnosis not present

## 2021-09-24 DIAGNOSIS — J849 Interstitial pulmonary disease, unspecified: Secondary | ICD-10-CM | POA: Diagnosis not present

## 2021-09-24 MED ORDER — PREDNISONE 10 MG PO TABS: ORAL_TABLET | ORAL | 0 refills | Status: DC

## 2021-09-24 NOTE — Progress Notes (Signed)
Chief Complaint  Patient presents with   [facility] f/u as well as 3 month f/u pt was seen at the hospital for SOB   HFU with son  Sob due to multifactorial issues chronic diastolic CHF, ILD, NSIP due to RA, asthma/COPD, recurrent pna, bilateral pleural effusions, ? [place] she is on O2 3L today and today at home O2 was not plugged in and feeling sob, echo with primary cards at [facility] 12/05/21 she just completed steroids in hospital but did not get them outpt there was ? About dosing, Abx and uses inhalers established [facility] as well. Son wants referral to CHF specialist   IMPRESSION: 1. Interval development of patchy ground-glass airspace disease in the right upper lobe and posterior left upper lobe, likely infectious/inflammatory. 2. Interval development of consolidative airspace disease in both lower lobes which may be in part related to atelectasis although consolidative pneumonia, notably in the right lower lobe, is suspected. 3. Small bilateral pleural effusions. 4. Enlargement of the pulmonary outflow tract/main pulmonary arteries suggests pulmonary arterial hypertension. 5. Aortic Atherosclerosis (ICD10-I70.0).     Electronically Signed   By: Misty Stanley M.D.   On: 09/08/2021 09:49   Review of Systems  Constitutional:  Negative for weight loss.  HENT:  Negative for hearing loss.   Eyes:  Negative for blurred vision.  Respiratory:  Positive for shortness of breath.   Cardiovascular:  Negative for chest pain.  Gastrointestinal:  Negative for abdominal pain and blood in stool.  Genitourinary:  Negative for dysuria.  Musculoskeletal:  Negative for falls and joint pain.  Skin:  Negative for rash.  Neurological:  Negative for headaches.  Psychiatric/Behavioral:  Negative for depression.    Past Medical History:  Diagnosis Date   Anxiety    Aortic stenosis 07/09/2015   a.) TTE 07/06/2015: EF 55-60%; mild AS with MPG 13.0 mmHg.   Arrhythmia    atrial  fibrillation   Asthma    Bipolar disorder ([place])    C. difficile diarrhea 2010   a.) following ABX course during hospital admission   Carotid atherosclerosis, bilateral    a.) Moderate; < 50% stenosis BILATERAL ICAs.   Cataract    a.) s/p BILATERAL extraction   CHF (congestive heart failure) (HCC)    Chicken pox    CKD (chronic kidney disease), stage III (HCC)    a. s/p R nephrectomy./ aneurysm   Conversion disorder    COPD (chronic obstructive pulmonary disease) (HCC)    Depression    Essential hypertension    GERD (gastroesophageal reflux disease)    Heart murmur    Hyperlipidemia    ILD (interstitial lung disease) (HCC)    mild; 2/2 RA diagnosis   Inflammatory arthritis    a. hands/carpal tunnel.  b. Low titer rheumatoid factor. c. Negative anti-CCP antibodies. d. Plaquenil.   Macular degeneration    Nocturnal hypoxemia    Non-Obstructive CAD    a. 07/2009 Cath ([facility]): nonobs dzs;  b. 03/2011 Cath [facility]): nonobs dzs.   Osteoarthritis    a. Knees.   PAD (peripheral artery disease) (HCC)    PUD (peptic ulcer disease)    S/P right hip fracture    11/01/16 s/p repair   Shoulder pain    Sleep apnea    no cpap / minimal   Spinal stenosis at L4-L5 level    severe with L4/L5 anterolisthesis grade 1 anterolisthesis    Toxic maculopathy    Valvular heart disease  a.) TTE 07/06/2015: EF 55-60%; Mild MR/AR/TR; Mild AS with MPG 13.0 mmHg.   Past Surgical History:  Procedure Laterality Date   APPENDECTOMY     APPLICATION OF INTRAOPERATIVE CT SCAN N/A 05/23/2021   Procedure: APPLICATION OF INTRAOPERATIVE CT SCAN;  Surgeon: Meade Maw, MD;  Location: [facility];  Service: Neurosurgery;  Laterality: N/A;   BACK SURGERY     lumbar fusion   BUNIONECTOMY Right    CATARACT EXTRACTION, BILATERAL     CESAREAN SECTION     x1   CHOLECYSTECTOMY N/A 05/11/2016   Procedure: LAPAROSCOPIC CHOLECYSTECTOMY;  Surgeon: Florene Glen, MD;  Location: [facility];  Service: General;   Laterality: N/A;   COLONOSCOPY WITH PROPOFOL N/A 04/02/2016   Procedure: COLONOSCOPY WITH PROPOFOL;  Surgeon: Jonathon Bellows, MD;  Location: [facility];  Service: Endoscopy;  Laterality: N/A;   ENDOSCOPIC RETROGRADE CHOLANGIOPANCREATOGRAPHY (ERCP) WITH PROPOFOL N/A 05/08/2016   Procedure: ENDOSCOPIC RETROGRADE CHOLANGIOPANCREATOGRAPHY (ERCP) WITH PROPOFOL;  Surgeon: Lucilla Lame, MD;  Location: [facility];  Service: Endoscopy;  Laterality: N/A;   ERCP     with biliary spincterotomy 05/08/16 Dr. Allen Norris for choledocholithiasis    ESOPHAGEAL DILATION  04/02/2016   Procedure: ESOPHAGEAL DILATION;  Surgeon: Jonathon Bellows, MD;  Location: [facility];  Service: Endoscopy;;   ESOPHAGOGASTRODUODENOSCOPY (EGD) WITH PROPOFOL N/A 04/02/2016   Procedure: ESOPHAGOGASTRODUODENOSCOPY (EGD) WITH PROPOFOL;  Surgeon: Jonathon Bellows, MD;  Location: [facility];  Service: Endoscopy;  Laterality: N/A;   HIP ARTHROPLASTY Right 11/01/2016   Procedure: ARTHROPLASTY BIPOLAR HIP (HEMIARTHROPLASTY);  Surgeon: Corky Mull, MD;  Location: [facility];  Service: Orthopedics;  Laterality: Right;   LUMBAR LAMINECTOMY/DECOMPRESSION MICRODISCECTOMY N/A 12/11/2020   Procedure: LEFT L2-3 MICRODISCECTOMY, L3-4 AND L5-S1 DECOMPRESSION;  Surgeon: Meade Maw, MD;  Location: [facility];  Service: Neurosurgery;  Laterality: N/A;   NEPHRECTOMY  1988   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   ORIF SCAPHOID FRACTURE Left 12/21/2019   Procedure: OPEN REDUCTION INTERNAL FIXATION (ORIF) OF LEFT SCAPULAR NONUNION WITH BONE GRAFT;  Surgeon: Corky Mull, MD;  Location: [facility];  Service: Orthopedics;  Laterality: Left;   osteoporosis     noted DEXA 08/19/16    REPLACEMENT TOTAL KNEE Right    REVERSE SHOULDER ARTHROPLASTY Right 11/04/2017   Procedure: REVERSE SHOULDER ARTHROPLASTY;  Surgeon: Corky Mull, MD;  Location: [facility];  Service: Orthopedics;  Laterality: Right;   REVERSE SHOULDER ARTHROPLASTY Left 07/26/2018    Procedure: REVERSE SHOULDER ARTHROPLASTY;  Surgeon: Corky Mull, MD;  Location: [facility];  Service: Orthopedics;  Laterality: Left;   TONSILLECTOMY     TOTAL HIP ARTHROPLASTY  12/10/11   [facility] left hip   TOTAL HIP ARTHROPLASTY Bilateral    TRANSFORAMINAL LUMBAR INTERBODY FUSION (TLIF) WITH PEDICLE SCREW FIXATION 3 LEVEL N/A 05/23/2021   Procedure: OPEN L2-4 TRANSFORAMINAL LUMBAR INTERBODY FUSION (TLIF) WITH L2-5 POSTERIOR SPINAL FUSION;  Surgeon: Meade Maw, MD;  Location: [facility];  Service: Neurosurgery;  Laterality: N/A;   TUBAL LIGATION     Family History  Problem Relation Age of Onset   Rheum arthritis Mother    Asthma Mother    Parkinson's disease Mother    Heart disease Mother    Stroke Mother    Hypertension Mother    Heart attack Father    Heart disease Father    Hypertension Father    Peripheral Artery Disease Father    Diabetes Son    Gout Son    Asthma Sister  Heart disease Sister    Lung cancer Sister    Heart disease Sister    Heart disease Sister    Breast cancer Sister    Heart attack Sister    Heart disease Brother    Heart disease Maternal Grandmother    Diabetes Maternal Grandmother    Colon cancer Maternal Grandmother    Cancer Maternal Grandmother        Hodgkins lymphoma   Heart disease Brother    Alcohol abuse Brother    Depression Brother    Dementia Son    Social History   Socioeconomic History   Marital status: Widowed    Spouse name: richard   Number of children: 2   Years of education: Some Coll   Highest education level: Some college, no degree  Occupational History   Occupation: Retired    Comment: retired  Tobacco Use   Smoking status: Former    Packs/day: 0.50    Years: 20.00    Total pack years: 10.00    Types: Cigarettes    Quit date: 02/02/1974    Years since quitting: 47.6   Smokeless tobacco: Never  Vaping Use   Vaping Use: Never used  Substance and Sexual Activity   Alcohol use: No   Drug use: No   Sexual  activity: Not Currently  Other Topics Concern   Not on file  Social History Narrative   Lives at home in [place] grandson, his wife and their child.   Husband Craig Wisnewski died covid 35 late 1/2049married x 50 years   Right-handed.   6 cups coffee per day.   Social Determinants of Health   Financial Resource Strain: Low Risk  (06/18/2021)   Overall Financial Resource Strain (CARDIA)    Difficulty of Paying Living Expenses: Not hard at all  Food Insecurity: No Food Insecurity (06/18/2021)   Hunger Vital Sign    Worried About Running Out of Food in the Last Year: Never true    Ran Out of Food in the Last Year: Never true  Transportation Needs: Unmet Transportation Needs (06/26/2021)   PRAPARE - Transportation    Lack of Transportation (Medical): Yes    Lack of Transportation (Non-Medical): Yes  Physical Activity: Inactive (11/24/2019)   Exercise Vital Sign    Days of Exercise per Week: 0 days    Minutes of Exercise per Session: 0 min  Stress: No Stress Concern Present (06/18/2021)   [place]    Feeling of Stress : Not at all  Social Connections: Moderately Integrated (06/18/2021)   Social Connection and Isolation Panel [NHANES]    Frequency of Communication with Friends and Family: More than three times a week    Frequency of Social Gatherings with Friends and Family: Never    Attends Religious Services: More than 4 times per year    Active Member of [employer] or Organizations: No    Attends Archivist Meetings: Never    Marital Status: Married  Human resources officer Violence: Not At Risk (06/18/2021)   Humiliation, Afraid, Rape, and Kick questionnaire    Fear of Current or Ex-Partner: No    Emotionally Abused: No    Physically Abused: No    Sexually Abused: No   Current Meds  Medication Sig   albuterol (VENTOLIN HFA) 108 (90 Base) MCG/ACT inhaler Inhale 2 puffs into the lungs every 6 (six) hours as needed  for wheezing or shortness of breath.   amLODipine (NORVASC) 5  MG tablet Take 1 tablet (5 mg total) by mouth daily.   apixaban (ELIQUIS) 5 MG TABS tablet Take 1 tablet (5 mg total) by mouth 2 (two) times daily.   budesonide (PULMICORT) 0.25 MG/2ML nebulizer solution USE 1 VIAL  IN  NEBULIZER TWICE  DAILY - rinse mouth after treatment   busPIRone (BUSPAR) 5 MG tablet Take 1 tablet (5 mg total) by mouth 2 (two) times daily.   Cholecalciferol (VITAMIN D3) 50 MCG (2000 UT) TABS Take 4,000 Units by mouth daily.   dicyclomine (BENTYL) 10 MG capsule Take 1 capsule (10 mg total) by mouth 2 (two) times daily as needed for spasms. (Patient taking differently: Take 20 mg by mouth 2 (two) times daily.)   escitalopram (LEXAPRO) 10 MG tablet Take 1 tablet (10 mg total) by mouth daily.   famotidine (PEPCID) 20 MG tablet TAKE 1 TABLET (20 MG TOTAL) BY MOUTH DAILY. BEFORE BREAKFAST OR DINNER   ferrous sulfate 325 (65 FE) MG EC tablet Take 1 tablet (325 mg total) by mouth 2 (two) times daily.   fluticasone (FLONASE) 50 MCG/ACT nasal spray Place 2 sprays into both nostrils daily. In am   gabapentin (NEURONTIN) 300 MG capsule TAKE 2 CAPSULES (600 MG TOTAL) BY MOUTH IN THE MORNING AND AT BEDTIME.   ipratropium-albuterol (DUONEB) 0.5-2.5 (3) MG/3ML SOLN Take 3 mLs by nebulization 3 (three) times daily as needed.   lamoTRIgine (LAMICTAL) 100 MG tablet TAKE 1 TAB 2 TIMES DAILY. FURTHER REFILLS NEW PSYCH FOR ALL PSYCH MEDS ONLY TEMP SUPPLY FROM PCP   leflunomide (ARAVA) 20 MG tablet Take 1 tablet (20 mg total) by mouth daily.   lidocaine (LIDODERM) 5 % Place 1 patch onto the skin 2 (two) times daily as needed. Remove & Discard patch within 12 hours or as directed by MD (Patient taking differently: Place 1 patch onto the skin daily. Remove & Discard patch within 12 hours or as directed by MD)   lovastatin (MEVACOR) 20 MG tablet TAKE 1 TABLET BY MOUTH EVERYDAY AT BEDTIME *STOP TALKING $RemoveBefor'40MG'[id number]$ * (Patient taking differently: Take 20  mg by mouth at bedtime.)   methocarbamol (ROBAXIN) 500 MG tablet Take 500 mg by mouth 4 (four) times daily as needed. Taking twice a day currently   mirabegron ER (MYRBETRIQ) 50 MG TB24 tablet Take 1 tablet (50 mg total) by mouth daily.   montelukast (SINGULAIR) 10 MG tablet TAKE 1 TABLET BY MOUTH EVERY DAY (Patient taking differently: Take 10 mg by mouth daily.)   multivitamin-lutein (OCUVITE-LUTEIN) CAPS capsule Take 1 capsule by mouth at bedtime.   nebivolol (BYSTOLIC) 5 MG tablet Take 1 tablet (5 mg total) by mouth in the morning and at bedtime. (Note dose changed from 1/2 10 mg bid to 5 mg bid)   OXYGEN Inhale 2 L into the lungs at bedtime.   pantoprazole (PROTONIX) 40 MG tablet TAKE 1 TABLET BY MOUTH 2 TIMES DAILY 30 MIN BEFORE FOOD (NOTE REDUCTION IN FREQUENCY)   QUEtiapine (SEROQUEL) 25 MG tablet TAKE 1 TABLET (25 MG TOTAL) BY MOUTH AT BEDTIME. AGAIN LAST FILL FURTHER REFILLS FROM PSYCHIATRY NO EXCEPTIONS   torsemide (DEMADEX) 20 MG tablet Take 1 tablet (20 mg total) by mouth daily.   zoledronic acid (RECLAST) 5 MG/100ML SOLN injection Inject 5 mg into the vein once.   [DISCONTINUED] predniSONE (DELTASONE) 10 MG tablet Take 40 mg (4 tabs) for 1 day then take 30 mg (3 tabs) for 1 day then take 20 mg (2 tabs) for 1 day then take  10 mg (1 tab) for 1 day then stop   Allergies  Allergen Reactions   Ceftin [Cefuroxime Axetil] Anaphylaxis   Lisinopril Anaphylaxis   Sulfa Antibiotics Other (See Comments)    Face swelling   Sulfasalazine Anaphylaxis   Morphine Other (See Comments)    Per patient, low blood pressure issues that requires action to raise it back up. Can take small infrequent doses   Xarelto [Rivaroxaban] Other (See Comments)    Stomach burning, bleeding, and tar in stool   Adhesive [Tape] Rash    Paper tape and tega derm OK   Antihistamines, Chlorpheniramine-Type Other (See Comments)    Makes pt hyper   Antivert [Meclizine Hcl] Other (See Comments)    Bladder will not empty    Aspirin Other (See Comments)    Sulfasalazine allergy cross reacts   Contrast Media [Iodinated Contrast Media] Rash    she is able to use betadine scrubs.   Decongestant [Pseudoephedrine Hcl] Other (See Comments)    Makes pt hyper   Doxycycline Other (See Comments)    GI upset   Levaquin [Levofloxacin In D5w] Rash   Polymyxin B Other (See Comments)    Facial rash   Tetanus Toxoids Rash and Other (See Comments)    Fever and hot to touch at injection site   Recent Results (from the past 2160 hour(s))  TSH     Status: None   Collection Time: 07/03/21  4:16 PM  Result Value Ref Range   TSH 2.53 0.35 - 5.50 uIU/mL  CBC with Differential/Platelet     Status: Abnormal   Collection Time: 07/03/21  4:16 PM  Result Value Ref Range   WBC 14.6 (H) 4.0 - 10.5 K/uL   RBC 4.01 3.87 - 5.11 Mil/uL   Hemoglobin 10.4 (L) 12.0 - 15.0 g/dL   HCT 33.0 (L) 36.0 - 46.0 %   MCV 82.4 78.0 - 100.0 fl   MCHC 31.4 30.0 - 36.0 g/dL   RDW 15.5 11.5 - 15.5 %   Platelets 313.0 150.0 - 400.0 K/uL   Neutrophils Relative % 52.7 43.0 - 77.0 %   Lymphocytes Relative 33.0 12.0 - 46.0 %   Monocytes Relative 7.3 3.0 - 12.0 %   Eosinophils Relative 3.5 0.0 - 5.0 %   Basophils Relative 3.5 (H) 0.0 - 3.0 %   Neutro Abs 7.7 1.4 - 7.7 K/uL   Lymphs Abs 4.8 (H) 0.7 - 4.0 K/uL   Monocytes Absolute 1.1 (H) 0.1 - 1.0 K/uL   Eosinophils Absolute 0.5 0.0 - 0.7 K/uL   Basophils Absolute 0.5 (H) 0.0 - 0.1 K/uL  Comprehensive metabolic panel     Status: Abnormal   Collection Time: 07/03/21  4:16 PM  Result Value Ref Range   Sodium 142 135 - 145 mEq/L   Potassium 4.3 3.5 - 5.1 mEq/L   Chloride 103 96 - 112 mEq/L   CO2 25 19 - 32 mEq/L   Glucose, Bld 118 (H) 70 - 99 mg/dL   BUN 19 6 - 23 mg/dL   Creatinine, Ser 1.41 (H) 0.40 - 1.20 mg/dL   Total Bilirubin 0.5 0.2 - 1.2 mg/dL   Alkaline Phosphatase 111 39 - 117 U/L   AST 19 0 - 37 U/L   ALT 14 0 - 35 U/L   Total Protein 7.3 6.0 - 8.3 g/dL   Albumin 3.9 3.5 - 5.2  g/dL   GFR 35.66 (L) >60.00 mL/min    Comment: Calculated using the CKD-EPI Creatinine Equation (2021)  Calcium 9.4 8.4 - 10.5 mg/dL  Pathologist smear review     Status: None   Collection Time: 07/03/21  4:16 PM  Result Value Ref Range   Path Review      Comment: Leukoerythroblastic smear with rare NRBCs and left shift to the myelocyte stage. RBCs appear to be microcytic and hypochromic on smear review. Suggest evaluation for iron deficiency, if clinically indicated. RBCs appear to be microcytic and hypochromic on smear  review. Suggest evaluation for iron deficiency, if clinically indicated. Platelet clumps noted on smear-count appears increased. Clinical correlation is recommended. Suggest hematologic evaluation, if clinically indicated. Reviewed by Francis Gaines Mammarappallil, MD  (Electronic Signature on File)     07/04/2021   Troponin I (High Sensitivity)     Status: Abnormal   Collection Time: 07/22/21  5:37 PM  Result Value Ref Range   Troponin I (High Sensitivity) 52 (H) <18 ng/L    Comment: (NOTE) Elevated high sensitivity troponin I (hsTnI) values and significant  changes across serial measurements may suggest ACS but many other  chronic and acute conditions are known to elevate hsTnI results.  Refer to the "Links" section for chest pain algorithms and additional  guidance. Performed at [facility], [address] [place], [place] [address]   CBC with Differential     Status: Abnormal   Collection Time: 07/22/21  5:37 PM  Result Value Ref Range   WBC 13.1 (H) 4.0 - 10.5 K/uL   RBC 3.98 3.87 - 5.11 MIL/uL   Hemoglobin 9.9 (L) 12.0 - 15.0 g/dL   HCT 33.2 (L) 36.0 - 46.0 %   MCV 83.4 80.0 - 100.0 fL   MCH 24.9 (L) 26.0 - 34.0 pg   MCHC 29.8 (L) 30.0 - 36.0 g/dL   RDW 16.1 (H) 11.5 - 15.5 %   Platelets 344 150 - 400 K/uL   nRBC 0.2 0.0 - 0.2 %   Neutrophils Relative % 56 %   Neutro Abs 7.4 1.7 - 7.7 K/uL   Lymphocytes Relative 31 %   Lymphs Abs 4.0 0.7  - 4.0 K/uL   Monocytes Relative 7 %   Monocytes Absolute 0.9 0.1 - 1.0 K/uL   Eosinophils Relative 4 %   Eosinophils Absolute 0.6 (H) 0.0 - 0.5 K/uL   Basophils Relative 1 %   Basophils Absolute 0.1 0.0 - 0.1 K/uL   Immature Granulocytes 1 %   Abs Immature Granulocytes 0.14 (H) 0.00 - 0.07 K/uL    Comment: Performed at [facility], [address] [place], [place] [address]  Brain natriuretic peptide     Status: Abnormal   Collection Time: 07/22/21  5:37 PM  Result Value Ref Range   B Natriuretic Peptide 334.3 (H) 0.0 - 100.0 pg/mL    Comment: Performed at [facility], [address] [place], [place] [address]  Comprehensive metabolic panel     Status: Abnormal   Collection Time: 07/22/21  6:49 PM  Result Value Ref Range   Sodium 142 135 - 145 mmol/L   Potassium 4.7 3.5 - 5.1 mmol/L    Comment: HEMOLYSIS AT THIS LEVEL MAY AFFECT RESULT   Chloride 109 98 - 111 mmol/L   CO2 24 22 - 32 mmol/L   Glucose, Bld 117 (H) 70 - 99 mg/dL    Comment: Glucose reference range applies only to samples taken after fasting for at least 8 hours.   BUN 14 8 - 23 mg/dL   Creatinine, Ser 1.01 (H) 0.44 -  1.00 mg/dL   Calcium 8.4 (L) 8.9 - 10.3 mg/dL   Total Protein 6.6 6.5 - 8.1 g/dL   Albumin 3.2 (L) 3.5 - 5.0 g/dL   AST 27 15 - 41 U/L   ALT 13 0 - 44 U/L   Alkaline Phosphatase 110 38 - 126 U/L   Total Bilirubin 0.7 0.3 - 1.2 mg/dL   GFR, Estimated 57 (L) >60 mL/min    Comment: (NOTE) Calculated using the CKD-EPI Creatinine Equation (2021)    Anion gap 9 5 - 15    Comment: Performed at [facility], [address] [place], [place] [address]  Procalcitonin - Baseline     Status: None   Collection Time: 07/22/21  6:49 PM  Result Value Ref Range   Procalcitonin <0.10 ng/mL    Comment:        Interpretation: PCT (Procalcitonin) <= 0.5 ng/mL: Systemic infection (sepsis) is not likely. Local bacterial infection is possible. (NOTE)       Sepsis PCT Algorithm            Lower Respiratory Tract                                      Infection PCT Algorithm    ----------------------------     ----------------------------         PCT < 0.25 ng/mL                PCT < 0.10 ng/mL          Strongly encourage             Strongly discourage   discontinuation of antibiotics    initiation of antibiotics    ----------------------------     -----------------------------       PCT 0.25 - 0.50 ng/mL            PCT 0.10 - 0.25 ng/mL               OR       >80% decrease in PCT            Discourage initiation of                                            antibiotics      Encourage discontinuation           of antibiotics    ----------------------------     -----------------------------         PCT >= 0.50 ng/mL              PCT 0.26 - 0.50 ng/mL               AND        <80% decrease in PCT             Encourage initiation of                                             antibiotics       Encourage continuation           of antibiotics    ----------------------------     -----------------------------  PCT >= 0.50 ng/mL                  PCT > 0.50 ng/mL               AND         increase in PCT                  Strongly encourage                                      initiation of antibiotics    Strongly encourage escalation           of antibiotics                                     -----------------------------                                           PCT <= 0.25 ng/mL                                                 OR                                        > 80% decrease in PCT                                      Discontinue / Do not initiate                                             antibiotics  Performed at [facility], [address], [place], [place] [address]   SARS Coronavirus 2 by RT PCR (hospital order, performed in [facility] hospital lab) *cepheid single result test* Anterior Nasal Swab     Status: None   Collection Time:  07/22/21  6:49 PM   Specimen: Anterior Nasal Swab  Result Value Ref Range   SARS Coronavirus 2 by RT PCR NEGATIVE NEGATIVE    Comment: (NOTE) SARS-CoV-2 target nucleic acids are NOT DETECTED.  The SARS-CoV-2 RNA is generally detectable in upper and lower respiratory specimens during the acute phase of infection. The lowest concentration of SARS-CoV-2 viral copies this assay can detect is 250 copies / mL. A negative result does not preclude SARS-CoV-2 infection and should not be used as the sole basis for treatment or other patient management decisions.  A negative result may occur with improper specimen collection / handling, submission of specimen other than nasopharyngeal swab, presence of viral mutation(s) within the areas targeted by this assay, and inadequate number of viral copies (<250 copies / mL). A negative result must be combined with clinical observations, patient history, and epidemiological information.  Fact Sheet for Patients:   https://www.patel.info/  Fact Sheet for  Healthcare Providers: https://hall.com/  This test is not yet approved or  cleared by the [place] and has been authorized for detection and/or diagnosis of SARS-CoV-2 by FDA under an Emergency Use Authorization (EUA).  This EUA will remain in effect (meaning this test can be used) for the duration of the COVID-19 declaration under Section 564(b)(1) of the Act, 21 U.S.C. section 360bbb-3(b)(1), unless the authorization is terminated or revoked sooner.  Performed at [facility], [place], [place] [address]   Troponin I (High Sensitivity)     Status: Abnormal   Collection Time: 07/22/21  6:49 PM  Result Value Ref Range   Troponin I (High Sensitivity) 51 (H) <18 ng/L    Comment: (NOTE) Elevated high sensitivity troponin I (hsTnI) values and significant  changes across serial measurements may suggest ACS but many other   chronic and acute conditions are known to elevate hsTnI results.  Refer to the "Links" section for chest pain algorithms and additional  guidance. Performed at [facility], [address] [place], [place] [address]   Blood culture (routine x 2)     Status: None   Collection Time: 07/22/21  7:40 PM   Specimen: BLOOD  Result Value Ref Range   Specimen Description BLOOD LEFT ASSIST CONTROL    Special Requests      BOTTLES DRAWN AEROBIC AND ANAEROBIC Blood Culture adequate volume   Culture      NO GROWTH 5 DAYS Performed at [facility], [address], [place], [place] [address]    Report Status 07/27/2021 FINAL   Blood culture (routine x 2)     Status: None   Collection Time: 07/22/21  7:41 PM   Specimen: BLOOD  Result Value Ref Range   Specimen Description BLOOD RIGHT HAND    Special Requests      BOTTLES DRAWN AEROBIC AND ANAEROBIC Blood Culture adequate volume   Culture      NO GROWTH 5 DAYS Performed at [facility], [address], [place], [place] [address]    Report Status 07/27/2021 FINAL   Lactic acid, plasma     Status: Abnormal   Collection Time: 07/22/21  7:41 PM  Result Value Ref Range   Lactic Acid, Venous 2.3 (HH) 0.5 - 1.9 mmol/L    Comment: CRITICAL RESULT CALLED TO, READ BACK BY AND VERIFIED WITH KATIE ALRED 07/22/21 2029 MU Performed at [facility], [address] [place], [place] [address]   Lactic acid, plasma     Status: None   Collection Time: 07/22/21 10:26 PM  Result Value Ref Range   Lactic Acid, Venous 1.6 0.5 - 1.9 mmol/L    Comment: Performed at [facility], [address], [place], [place] [address]  Basic metabolic panel     Status: Abnormal   Collection Time: 07/23/21  3:04 AM  Result Value Ref Range   Sodium 141 135 - 145 mmol/L   Potassium 4.6 3.5 - 5.1 mmol/L   Chloride 109 98 - 111 mmol/L   CO2 24 22 - 32 mmol/L   Glucose, Bld 209 (H) 70 - 99 mg/dL    Comment: Glucose reference  range applies only to samples taken after fasting for at least 8 hours.   BUN 14 8 - 23 mg/dL   Creatinine, Ser 1.01 (H) 0.44 - 1.00 mg/dL   Calcium 8.2 (L) 8.9 - 10.3 mg/dL   GFR, Estimated 57 (L) >60 mL/min    Comment: (NOTE)  Calculated using the CKD-EPI Creatinine Equation (2021)    Anion gap 8 5 - 15    Comment: Performed at [facility], [address] [place], [place] [address]  CBC     Status: Abnormal   Collection Time: 07/23/21  3:04 AM  Result Value Ref Range   WBC 10.7 (H) 4.0 - 10.5 K/uL   RBC 3.65 (L) 3.87 - 5.11 MIL/uL   Hemoglobin 9.0 (L) 12.0 - 15.0 g/dL   HCT 29.8 (L) 36.0 - 46.0 %   MCV 81.6 80.0 - 100.0 fL   MCH 24.7 (L) 26.0 - 34.0 pg   MCHC 30.2 30.0 - 36.0 g/dL   RDW 15.8 (H) 11.5 - 15.5 %   Platelets 294 150 - 400 K/uL   nRBC 0.0 0.0 - 0.2 %    Comment: Performed at [facility], [address], [place], [place] [address]  Legionella Pneumophila Serogp 1 Ur Ag     Status: None   Collection Time: 07/23/21  8:51 AM  Result Value Ref Range   L. pneumophila Serogp 1 Ur Ag Negative Negative    Comment: (NOTE) Presumptive negative for L. pneumophila serogroup 1 antigen in urine, suggesting no recent or current infection. Legionnaires' disease cannot be ruled out since other serogroups and species may also cause disease. Performed At: [facility] [place], [place] [id number] Rush Farmer MD OL:[id number]    Source of Sample URINE, RANDOM     Comment: Performed at [facility], [address] [place], [place] [address]  Strep pneumoniae urinary antigen     Status: None   Collection Time: 07/23/21  8:51 AM  Result Value Ref Range   Strep Pneumo Urinary Antigen NEGATIVE NEGATIVE    Comment:        Infection due to S. pneumoniae cannot be absolutely ruled out since the antigen present may be below the detection limit of the test. Performed at [facility], [address], [place], [place]  [address]   Expectorated Sputum Assessment w Gram Stain, Rflx to Resp Cult     Status: None   Collection Time: 07/23/21  8:51 AM   Specimen: Sputum  Result Value Ref Range   Specimen Description SPUTUM    Special Requests NONE    Sputum evaluation      THIS SPECIMEN IS ACCEPTABLE FOR SPUTUM CULTURE Performed at [facility], [address] [place], [place] [address]    Report Status 07/23/2021 FINAL   Culture, Respiratory w Gram Stain     Status: None   Collection Time: 07/23/21  8:51 AM   Specimen: SPU  Result Value Ref Range   Specimen Description      SPUTUM Performed at [facility], [address], [place], [place] [address]    Special Requests      NONE Reflexed from [phone] Performed at [facility], [place], [place] [address]    Gram Stain NO WBC SEEN RARE BUDDING YEAST SEEN     Culture      FEW Normal respiratory flora-no Staph aureus or Pseudomonas seen Performed at [facility], [place] [address], [place],  [address]    Report Status 07/26/2021 FINAL   Respiratory (~20 pathogens) panel by PCR     Status: None   Collection Time: 07/23/21 12:07 PM   Specimen: Nasopharyngeal Swab; Respiratory  Result Value Ref Range   Adenovirus NOT DETECTED NOT DETECTED   Coronavirus  229E NOT DETECTED NOT DETECTED    Comment: (NOTE) The Coronavirus on the Respiratory Panel, DOES NOT test for the novel  Coronavirus (2019 nCoV)    Coronavirus HKU1 NOT DETECTED NOT DETECTED   Coronavirus NL63 NOT DETECTED NOT DETECTED   Coronavirus OC43 NOT DETECTED NOT DETECTED   Metapneumovirus NOT DETECTED NOT DETECTED   Rhinovirus / Enterovirus NOT DETECTED NOT DETECTED   Influenza A NOT DETECTED NOT DETECTED   Influenza B NOT DETECTED NOT DETECTED   Parainfluenza Virus 1 NOT DETECTED NOT DETECTED   Parainfluenza Virus 2 NOT DETECTED NOT DETECTED   Parainfluenza Virus 3 NOT DETECTED NOT DETECTED   Parainfluenza Virus 4 NOT DETECTED NOT  DETECTED   Respiratory Syncytial Virus NOT DETECTED NOT DETECTED   Bordetella pertussis NOT DETECTED NOT DETECTED   Bordetella Parapertussis NOT DETECTED NOT DETECTED   Chlamydophila pneumoniae NOT DETECTED NOT DETECTED   Mycoplasma pneumoniae NOT DETECTED NOT DETECTED    Comment: Performed at [facility], [place] [address], [place], [place] [address]  CBC     Status: Abnormal   Collection Time: 07/24/21  4:14 AM  Result Value Ref Range   WBC 16.0 (H) 4.0 - 10.5 K/uL   RBC 3.34 (L) 3.87 - 5.11 MIL/uL   Hemoglobin 8.2 (L) 12.0 - 15.0 g/dL   HCT 27.3 (L) 36.0 - 46.0 %   MCV 81.7 80.0 - 100.0 fL   MCH 24.6 (L) 26.0 - 34.0 pg   MCHC 30.0 30.0 - 36.0 g/dL   RDW 15.8 (H) 11.5 - 15.5 %   Platelets 281 150 - 400 K/uL   nRBC 0.0 0.0 - 0.2 %    Comment: Performed at [facility], [address] [place], [place] [address]  Basic metabolic panel     Status: Abnormal   Collection Time: 07/24/21  4:14 AM  Result Value Ref Range   Sodium 139 135 - 145 mmol/L   Potassium 4.1 3.5 - 5.1 mmol/L   Chloride 109 98 - 111 mmol/L   CO2 24 22 - 32 mmol/L   Glucose, Bld 117 (H) 70 - 99 mg/dL    Comment: Glucose reference range applies only to samples taken after fasting for at least 8 hours.   BUN 21 8 - 23 mg/dL   Creatinine, Ser 0.99 0.44 - 1.00 mg/dL   Calcium 8.3 (L) 8.9 - 10.3 mg/dL   GFR, Estimated 58 (L) >60 mL/min    Comment: (NOTE) Calculated using the CKD-EPI Creatinine Equation (2021)    Anion gap 6 5 - 15    Comment: Performed at [facility], [address] [place], [place] [address]  Brain natriuretic peptide     Status: Abnormal   Collection Time: 07/28/21  1:19 PM  Result Value Ref Range   B Natriuretic Peptide 670.5 (H) 0.0 - 100.0 pg/mL    Comment: Performed at [facility], [address] [place], [place] [address]  Lactic acid, plasma     Status: None   Collection Time: 07/28/21  1:19 PM  Result Value Ref Range   Lactic Acid, Venous 1.4  0.5 - 1.9 mmol/L    Comment: Performed at [facility], [place], [place] [address]  Troponin I (High Sensitivity)     Status: Abnormal   Collection Time: 07/28/21  1:19 PM  Result Value Ref Range   Troponin I (High Sensitivity) 77 (H) <18 ng/L    Comment: (NOTE) Elevated high sensitivity troponin I (  hsTnI) values and significant  changes across serial measurements may suggest ACS but many other  chronic and acute conditions are known to elevate hsTnI results.  Refer to the "Links" section for chest pain algorithms and additional  guidance. Performed at [facility], [address] [place], [place] [address]   CBC with Differential     Status: Abnormal   Collection Time: 07/28/21  1:19 PM  Result Value Ref Range   WBC 15.1 (H) 4.0 - 10.5 K/uL   RBC 3.86 (L) 3.87 - 5.11 MIL/uL   Hemoglobin 9.4 (L) 12.0 - 15.0 g/dL   HCT 31.0 (L) 36.0 - 46.0 %   MCV 80.3 80.0 - 100.0 fL   MCH 24.4 (L) 26.0 - 34.0 pg   MCHC 30.3 30.0 - 36.0 g/dL   RDW 16.2 (H) 11.5 - 15.5 %   Platelets 379 150 - 400 K/uL   nRBC 0.1 0.0 - 0.2 %   Neutrophils Relative % 77 %   Neutro Abs 11.7 (H) 1.7 - 7.7 K/uL   Lymphocytes Relative 16 %   Lymphs Abs 2.4 0.7 - 4.0 K/uL   Monocytes Relative 3 %   Monocytes Absolute 0.4 0.1 - 1.0 K/uL   Eosinophils Relative 1 %   Eosinophils Absolute 0.1 0.0 - 0.5 K/uL   Basophils Relative 0 %   Basophils Absolute 0.0 0.0 - 0.1 K/uL   Immature Granulocytes 3 %   Abs Immature Granulocytes 0.41 (H) 0.00 - 0.07 K/uL    Comment: Performed at [facility], [address] [place],  [address]  Resp Panel by RT-PCR (Flu A&B, Covid) Anterior Nasal Swab     Status: None   Collection Time: 07/28/21  1:19 PM   Specimen: Anterior Nasal Swab  Result Value Ref Range   SARS Coronavirus 2 by RT PCR NEGATIVE NEGATIVE    Comment: (NOTE) SARS-CoV-2 target nucleic acids are NOT DETECTED.  The SARS-CoV-2 RNA is generally detectable in upper  respiratory specimens during the acute phase of infection. The lowest concentration of SARS-CoV-2 viral copies this assay can detect is 138 copies/mL. A negative result does not preclude SARS-Cov-2 infection and should not be used as the sole basis for treatment or other patient management decisions. A negative result may occur with  improper specimen collection/handling, submission of specimen other than nasopharyngeal swab, presence of viral mutation(s) within the areas targeted by this assay, and inadequate number of viral copies(<138 copies/mL). A negative result must be combined with clinical observations, patient history, and epidemiological information. The expected result is Negative.  Fact Sheet for Patients:  EntrepreneurPulse.com.au  Fact Sheet for Healthcare Providers:  IncredibleEmployment.be  This test is no t yet approved or cleared by the [place] FDA and  has been authorized for detection and/or diagnosis of SARS-CoV-2 by FDA under an Emergency Use Authorization (EUA). This EUA will remain  in effect (meaning this test can be used) for the duration of the COVID-19 declaration under Section 564(b)(1) of the Act, 21 U.S.C.section 360bbb-3(b)(1), unless the authorization is terminated  or revoked sooner.       Influenza A by PCR NEGATIVE NEGATIVE   Influenza B by PCR NEGATIVE NEGATIVE    Comment: (NOTE) The Xpert Xpress SARS-CoV-2/FLU/RSV plus assay is intended as an aid in the diagnosis of influenza from Nasopharyngeal swab specimens and should not be used as a sole basis for treatment. Nasal washings and aspirates are unacceptable for Xpert Xpress SARS-CoV-2/FLU/RSV testing.  Fact Sheet for Patients: EntrepreneurPulse.com.au  Fact Sheet for Healthcare Providers: IncredibleEmployment.be  This test is not yet approved or cleared by the [place] FDA and has been authorized for  detection and/or diagnosis of SARS-CoV-2 by FDA under an Emergency Use Authorization (EUA). This EUA will remain in effect (meaning this test can be used) for the duration of the COVID-19 declaration under Section 564(b)(1) of the Act, 21 U.S.C. section 360bbb-3(b)(1), unless the authorization is terminated or revoked.  Performed at [facility], [address] [place], [place] [address]   Blood gas, venous     Status: Abnormal   Collection Time: 07/28/21  1:19 PM  Result Value Ref Range   FIO2 40 %   Delivery systems BILEVEL POSITIVE AIRWAY PRESSURE    pH, Ven 7.49 (H) 7.25 - 7.43   pCO2, Ven 33 (L) 44 - 60 mmHg   pO2, Ven 123 (H) 32 - 45 mmHg   Bicarbonate 25.1 20.0 - 28.0 mmol/L   Acid-Base Excess 2.2 (H) 0.0 - 2.0 mmol/L   O2 Saturation 97.6 %   Patient temperature 37.0    Collection site VENOUS     Comment: Performed at [facility], [place], [place] [address]  Troponin I (High Sensitivity)     Status: Abnormal   Collection Time: 07/28/21  3:13 PM  Result Value Ref Range   Troponin I (High Sensitivity) 69 (H) <18 ng/L    Comment: (NOTE) Elevated high sensitivity troponin I (hsTnI) values and significant  changes across serial measurements may suggest ACS but many other  chronic and acute conditions are known to elevate hsTnI results.  Refer to the "Links" section for chest pain algorithms and additional  guidance. Performed at [facility], [address] [place], [place] [address]   Procalcitonin - Baseline     Status: None   Collection Time: 07/28/21  3:13 PM  Result Value Ref Range   Procalcitonin <0.10 ng/mL    Comment:        Interpretation: PCT (Procalcitonin) <= 0.5 ng/mL: Systemic infection (sepsis) is not likely. Local bacterial infection is possible. (NOTE)       Sepsis PCT Algorithm           Lower Respiratory Tract                                      Infection PCT Algorithm     ----------------------------     ----------------------------         PCT < 0.25 ng/mL                PCT < 0.10 ng/mL          Strongly encourage             Strongly discourage   discontinuation of antibiotics    initiation of antibiotics    ----------------------------     -----------------------------       PCT 0.25 - 0.50 ng/mL            PCT 0.10 - 0.25 ng/mL               OR       >80% decrease in PCT            Discourage initiation of  antibiotics      Encourage discontinuation           of antibiotics    ----------------------------     -----------------------------         PCT >= 0.50 ng/mL              PCT 0.26 - 0.50 ng/mL               AND        <80% decrease in PCT             Encourage initiation of                                             antibiotics       Encourage continuation           of antibiotics    ----------------------------     -----------------------------        PCT >= 0.50 ng/mL                  PCT > 0.50 ng/mL               AND         increase in PCT                  Strongly encourage                                      initiation of antibiotics    Strongly encourage escalation           of antibiotics                                     -----------------------------                                           PCT <= 0.25 ng/mL                                                 OR                                        > 80% decrease in PCT                                      Discontinue / Do not initiate                                             antibiotics  Performed at [facility], [address] [place], [place] [address]   Lactic acid, plasma     Status: None   Collection Time:  07/28/21  3:19 PM  Result Value Ref Range   Lactic Acid, Venous 1.0 0.5 - 1.9 mmol/L    Comment: Performed at [facility], [address] [place], [place] [address]  Basic metabolic panel      Status: Abnormal   Collection Time: 07/29/21  5:50 AM  Result Value Ref Range   Sodium 139 135 - 145 mmol/L   Potassium 3.5 3.5 - 5.1 mmol/L   Chloride 103 98 - 111 mmol/L   CO2 27 22 - 32 mmol/L   Glucose, Bld 90 70 - 99 mg/dL    Comment: Glucose reference range applies only to samples taken after fasting for at least 8 hours.   BUN 25 (H) 8 - 23 mg/dL   Creatinine, Ser 1.28 (H) 0.44 - 1.00 mg/dL   Calcium 8.1 (L) 8.9 - 10.3 mg/dL   GFR, Estimated 43 (L) >60 mL/min    Comment: (NOTE) Calculated using the CKD-EPI Creatinine Equation (2021)    Anion gap 9 5 - 15    Comment: Performed at [facility], [address] [place], [place] [address]  Brain natriuretic peptide     Status: Abnormal   Collection Time: 07/29/21  5:51 AM  Result Value Ref Range   B Natriuretic Peptide 503.4 (H) 0.0 - 100.0 pg/mL    Comment: Performed at [facility], [address] [place], [place] [address]  D-dimer, quantitative     Status: Abnormal   Collection Time: 07/29/21  8:15 AM  Result Value Ref Range   D-Dimer, Quant 1.38 (H) 0.00 - 0.50 ug/mL-FEU    Comment: (NOTE) At the manufacturer cut-off value of 0.5 g/mL FEU, this assay has a negative predictive value of 95-100%.This assay is intended for use in conjunction with a clinical pretest probability (PTP) assessment model to exclude pulmonary embolism (PE) and deep venous thrombosis (DVT) in outpatients suspected of PE or DVT. Results should be correlated with clinical presentation. Performed at [facility], [address] [place], [place] [address]   ECHOCARDIOGRAM COMPLETE     Status: None   Collection Time: 07/29/21 12:55 PM  Result Value Ref Range   Weight 2,769.68 oz   Height 61.5 in   BP 130/52 mmHg   Ao pk vel 2.71 m/s   AV Area VTI 1.37 cm2   AR max vel 1.23 cm2   AV Mean grad 17.0 mmHg   AV Peak grad 29.4 mmHg   S' Lateral 3.10 cm   AV Area mean vel 1.28 cm2   Area-P 1/2 2.93 cm2   MV VTI  1.55 cm2  Basic metabolic panel     Status: Abnormal   Collection Time: 07/29/21  3:29 PM  Result Value Ref Range   Sodium 138 135 - 145 mmol/L   Potassium 3.8 3.5 - 5.1 mmol/L   Chloride 99 98 - 111 mmol/L   CO2 27 22 - 32 mmol/L   Glucose, Bld 206 (H) 70 - 99 mg/dL    Comment: Glucose reference range applies only to samples taken after fasting for at least 8 hours.   BUN 28 (H) 8 - 23 mg/dL   Creatinine, Ser 1.60 (H) 0.44 - 1.00 mg/dL   Calcium 8.3 (L) 8.9 - 10.3 mg/dL   GFR, Estimated 33 (L) >60 mL/min    Comment: (NOTE) Calculated using the CKD-EPI Creatinine Equation (2021)    Anion gap 12 5 - 15    Comment: Performed at [facility], [address] [place], [place] [address]  Basic metabolic panel     Status: Abnormal   Collection Time: 07/30/21  4:44 AM  Result Value Ref Range   Sodium 141 135 - 145 mmol/L   Potassium 3.6 3.5 - 5.1 mmol/L   Chloride 102 98 - 111 mmol/L   CO2 28 22 - 32 mmol/L   Glucose, Bld 94 70 - 99 mg/dL    Comment: Glucose reference range applies only to samples taken after fasting for at least 8 hours.   BUN 25 (H) 8 - 23 mg/dL   Creatinine, Ser 1.26 (H) 0.44 - 1.00 mg/dL   Calcium 8.5 (L) 8.9 - 10.3 mg/dL   GFR, Estimated 44 (L) >60 mL/min    Comment: (NOTE) Calculated using the CKD-EPI Creatinine Equation (2021)    Anion gap 11 5 - 15    Comment: Performed at [facility], [address] [place], [place] [address]  CBC     Status: Abnormal   Collection Time: 07/31/21  6:05 AM  Result Value Ref Range   WBC 13.9 (H) 4.0 - 10.5 K/uL   RBC 3.88 3.87 - 5.11 MIL/uL   Hemoglobin 9.4 (L) 12.0 - 15.0 g/dL   HCT 31.1 (L) 36.0 - 46.0 %   MCV 80.2 80.0 - 100.0 fL   MCH 24.2 (L) 26.0 - 34.0 pg   MCHC 30.2 30.0 - 36.0 g/dL   RDW 16.4 (H) 11.5 - 15.5 %   Platelets 324 150 - 400 K/uL   nRBC 0.0 0.0 - 0.2 %    Comment: Performed at [facility], [address] [place], [place] [address]  Basic metabolic panel      Status: Abnormal   Collection Time: 07/31/21  6:05 AM  Result Value Ref Range   Sodium 141 135 - 145 mmol/L   Potassium 3.8 3.5 - 5.1 mmol/L   Chloride 101 98 - 111 mmol/L   CO2 31 22 - 32 mmol/L   Glucose, Bld 100 (H) 70 - 99 mg/dL    Comment: Glucose reference range applies only to samples taken after fasting for at least 8 hours.   BUN 25 (H) 8 - 23 mg/dL   Creatinine, Ser 1.27 (H) 0.44 - 1.00 mg/dL   Calcium 8.9 8.9 - 10.3 mg/dL   GFR, Estimated 43 (L) >60 mL/min    Comment: (NOTE) Calculated using the CKD-EPI Creatinine Equation (2021)    Anion gap 9 5 - 15    Comment: Performed at [facility], [address] [place], [place] [address]  Resp Panel by RT-PCR (Flu A&B, Covid) Anterior Nasal Swab     Status: None   Collection Time: 08/20/21  7:48 AM   Specimen: Anterior Nasal Swab  Result Value Ref Range   SARS Coronavirus 2 by RT PCR NEGATIVE NEGATIVE    Comment: (NOTE) SARS-CoV-2 target nucleic acids are NOT DETECTED.  The SARS-CoV-2 RNA is generally detectable in upper respiratory specimens during the acute phase of infection. The lowest concentration of SARS-CoV-2 viral copies this assay can detect is 138 copies/mL. A negative result does not preclude SARS-Cov-2 infection and should not be used as the sole basis for treatment or other patient management decisions. A negative result may occur with  improper specimen collection/handling, submission of specimen other than nasopharyngeal swab, presence of viral mutation(s) within the areas targeted by this assay, and inadequate number of viral copies(<138 copies/mL). A negative result must be combined with clinical observations, patient history, and epidemiological  information. The expected result is Negative.  Fact Sheet for Patients:  EntrepreneurPulse.com.au  Fact Sheet for Healthcare Providers:  IncredibleEmployment.be  This test is no t yet approved or cleared by the  [place] FDA and  has been authorized for detection and/or diagnosis of SARS-CoV-2 by FDA under an Emergency Use Authorization (EUA). This EUA will remain  in effect (meaning this test can be used) for the duration of the COVID-19 declaration under Section 564(b)(1) of the Act, 21 U.S.C.section 360bbb-3(b)(1), unless the authorization is terminated  or revoked sooner.       Influenza A by PCR NEGATIVE NEGATIVE   Influenza B by PCR NEGATIVE NEGATIVE    Comment: (NOTE) The Xpert Xpress SARS-CoV-2/FLU/RSV plus assay is intended as an aid in the diagnosis of influenza from Nasopharyngeal swab specimens and should not be used as a sole basis for treatment. Nasal washings and aspirates are unacceptable for Xpert Xpress SARS-CoV-2/FLU/RSV testing.  Fact Sheet for Patients: EntrepreneurPulse.com.au  Fact Sheet for Healthcare Providers: IncredibleEmployment.be  This test is not yet approved or cleared by the [place] FDA and has been authorized for detection and/or diagnosis of SARS-CoV-2 by FDA under an Emergency Use Authorization (EUA). This EUA will remain in effect (meaning this test can be used) for the duration of the COVID-19 declaration under Section 564(b)(1) of the Act, 21 U.S.C. section 360bbb-3(b)(1), unless the authorization is terminated or revoked.  Performed at [facility], [address] [place], [place] [address]   CBC with Differential     Status: Abnormal   Collection Time: 08/20/21  7:48 AM  Result Value Ref Range   WBC 13.1 (H) 4.0 - 10.5 K/uL   RBC 3.67 (L) 3.87 - 5.11 MIL/uL   Hemoglobin 8.5 (L) 12.0 - 15.0 g/dL   HCT 29.5 (L) 36.0 - 46.0 %   MCV 80.4 80.0 - 100.0 fL   MCH 23.2 (L) 26.0 - 34.0 pg   MCHC 28.8 (L) 30.0 - 36.0 g/dL   RDW 16.6 (H) 11.5 - 15.5 %   Platelets 327 150 - 400 K/uL   nRBC 0.0 0.0 - 0.2 %   Neutrophils Relative % 42 %   Neutro Abs 5.6 1.7 - 7.7 K/uL   Lymphocytes Relative  36 %   Lymphs Abs 4.7 (H) 0.7 - 4.0 K/uL   Monocytes Relative 10 %   Monocytes Absolute 1.3 (H) 0.1 - 1.0 K/uL   Eosinophils Relative 10 %   Eosinophils Absolute 1.3 (H) 0.0 - 0.5 K/uL   Basophils Relative 1 %   Basophils Absolute 0.1 0.0 - 0.1 K/uL   Immature Granulocytes 1 %   Abs Immature Granulocytes 0.15 (H) 0.00 - 0.07 K/uL    Comment: Performed at [facility], [address] [place], [place] [address]  Comprehensive metabolic panel     Status: Abnormal   Collection Time: 08/20/21  7:48 AM  Result Value Ref Range   Sodium 141 135 - 145 mmol/L   Potassium 3.5 3.5 - 5.1 mmol/L   Chloride 107 98 - 111 mmol/L   CO2 26 22 - 32 mmol/L   Glucose, Bld 135 (H) 70 - 99 mg/dL    Comment: Glucose reference range applies only to samples taken after fasting for at least 8 hours.   BUN 26 (H) 8 - 23 mg/dL   Creatinine, Ser 1.52 (H) 0.44 - 1.00 mg/dL   Calcium 8.3 (L) 8.9 - 10.3 mg/dL   Total Protein 6.7 6.5 - 8.1 g/dL  Albumin 3.3 (L) 3.5 - 5.0 g/dL   AST 25 15 - 41 U/L   ALT 15 0 - 44 U/L   Alkaline Phosphatase 104 38 - 126 U/L   Total Bilirubin 0.4 0.3 - 1.2 mg/dL   GFR, Estimated 35 (L) >60 mL/min    Comment: (NOTE) Calculated using the CKD-EPI Creatinine Equation (2021)    Anion gap 8 5 - 15    Comment: Performed at [facility], [address] [place], [place] [address]  Brain natriuretic peptide     Status: Abnormal   Collection Time: 08/20/21  7:48 AM  Result Value Ref Range   B Natriuretic Peptide 410.9 (H) 0.0 - 100.0 pg/mL    Comment: Performed at [facility], [address] [place], [place] [address]  Procalcitonin - Baseline     Status: None   Collection Time: 08/20/21  7:48 AM  Result Value Ref Range   Procalcitonin <0.10 ng/mL    Comment:        Interpretation: PCT (Procalcitonin) <= 0.5 ng/mL: Systemic infection (sepsis) is not likely. Local bacterial infection is possible. (NOTE)       Sepsis PCT Algorithm           Lower  Respiratory Tract                                      Infection PCT Algorithm    ----------------------------     ----------------------------         PCT < 0.25 ng/mL                PCT < 0.10 ng/mL          Strongly encourage             Strongly discourage   discontinuation of antibiotics    initiation of antibiotics    ----------------------------     -----------------------------       PCT 0.25 - 0.50 ng/mL            PCT 0.10 - 0.25 ng/mL               OR       >80% decrease in PCT            Discourage initiation of                                            antibiotics      Encourage discontinuation           of antibiotics    ----------------------------     -----------------------------         PCT >= 0.50 ng/mL              PCT 0.26 - 0.50 ng/mL               AND        <80% decrease in PCT             Encourage initiation of                                             antibiotics  Encourage continuation           of antibiotics    ----------------------------     -----------------------------        PCT >= 0.50 ng/mL                  PCT > 0.50 ng/mL               AND         increase in PCT                  Strongly encourage                                      initiation of antibiotics    Strongly encourage escalation           of antibiotics                                     -----------------------------                                           PCT <= 0.25 ng/mL                                                 OR                                        > 80% decrease in PCT                                      Discontinue / Do not initiate                                             antibiotics  Performed at [facility], [address] [place], [place] [address]   Blood gas, venous     Status: Abnormal   Collection Time: 08/20/21 10:12 AM  Result Value Ref Range   pH, Ven 7.39 7.25 - 7.43   pCO2, Ven 46 44 - 60 mmHg   pO2, Ven 42 32 - 45  mmHg   Bicarbonate 27.8 20.0 - 28.0 mmol/L   Acid-Base Excess 2.4 (H) 0.0 - 2.0 mmol/L   O2 Saturation 66.4 %   Patient temperature 37.0    Collection site VEIN     Comment: Performed at [facility], [address] [place], [place] [address]  CBC     Status: Abnormal   Collection Time: 08/21/21  5:00 AM  Result Value Ref Range   WBC 13.6 (H) 4.0 - 10.5 K/uL   RBC 3.25 (L) 3.87 - 5.11 MIL/uL   Hemoglobin 7.5 (L) 12.0 - 15.0 g/dL    Comment: Reticulocyte Hemoglobin testing may be clinically indicated, consider ordering this additional test  [id number]    HCT 25.3 (L) 36.0 - 46.0 %   MCV 77.8 (L) 80.0 - 100.0 fL   MCH 23.1 (L) 26.0 - 34.0 pg   MCHC 29.6 (L) 30.0 - 36.0 g/dL   RDW 16.6 (H) 11.5 - 15.5 %   Platelets 286 150 - 400 K/uL   nRBC 0.1 0.0 - 0.2 %    Comment: Performed at [facility], [address] [place], [place] [address]  Basic metabolic panel     Status: Abnormal   Collection Time: 08/21/21  5:00 AM  Result Value Ref Range   Sodium 139 135 - 145 mmol/L   Potassium 3.6 3.5 - 5.1 mmol/L   Chloride 104 98 - 111 mmol/L   CO2 27 22 - 32 mmol/L   Glucose, Bld 136 (H) 70 - 99 mg/dL    Comment: Glucose reference range applies only to samples taken after fasting for at least 8 hours.   BUN 26 (H) 8 - 23 mg/dL   Creatinine, Ser 1.28 (H) 0.44 - 1.00 mg/dL   Calcium 8.2 (L) 8.9 - 10.3 mg/dL   GFR, Estimated 43 (L) >60 mL/min    Comment: (NOTE) Calculated using the CKD-EPI Creatinine Equation (2021)    Anion gap 8 5 - 15    Comment: Performed at [facility], [address] [place], [place] [address]  Basic metabolic panel     Status: Abnormal   Collection Time: 08/22/21  4:46 AM  Result Value Ref Range   Sodium 140 135 - 145 mmol/L   Potassium 4.0 3.5 - 5.1 mmol/L   Chloride 104 98 - 111 mmol/L   CO2 24 22 - 32 mmol/L   Glucose, Bld 131 (H) 70 - 99 mg/dL    Comment: Glucose reference range applies only to samples taken after fasting  for at least 8 hours.   BUN 29 (H) 8 - 23 mg/dL   Creatinine, Ser 1.36 (H) 0.44 - 1.00 mg/dL   Calcium 8.2 (L) 8.9 - 10.3 mg/dL   GFR, Estimated 40 (L) >60 mL/min    Comment: (NOTE) Calculated using the CKD-EPI Creatinine Equation (2021)    Anion gap 12 5 - 15    Comment: Performed at [facility], [address] [place], [place] [address]  CBC     Status: Abnormal   Collection Time: 08/22/21  4:46 AM  Result Value Ref Range   WBC 14.2 (H) 4.0 - 10.5 K/uL   RBC 3.37 (L) 3.87 - 5.11 MIL/uL   Hemoglobin 8.0 (L) 12.0 - 15.0 g/dL   HCT 27.6 (L) 36.0 - 46.0 %   MCV 81.9 80.0 - 100.0 fL   MCH 23.7 (L) 26.0 - 34.0 pg   MCHC 29.0 (L) 30.0 - 36.0 g/dL   RDW 16.8 (H) 11.5 - 15.5 %   Platelets 252 150 - 400 K/uL   nRBC 0.1 0.0 - 0.2 %    Comment: Performed at [facility], [address] [place], [place] [address]  CBC with Differential/Platelet     Status: Abnormal   Collection Time: 09/08/21  6:08 AM  Result Value Ref Range   WBC 18.1 (H) 4.0 - 10.5 K/uL   RBC 3.83 (L) 3.87 - 5.11 MIL/uL   Hemoglobin 8.8 (L) 12.0 - 15.0 g/dL   HCT 30.8 (L) 36.0 - 46.0 %   MCV 80.4 80.0 - 100.0 fL   MCH 23.0 (L) 26.0 - 34.0 pg   MCHC 28.6 (L) 30.0 -  36.0 g/dL   RDW 17.2 (H) 11.5 - 15.5 %   Platelets 265 150 - 400 K/uL   nRBC 0.0 0.0 - 0.2 %   Neutrophils Relative % 64 %   Neutro Abs 11.7 (H) 1.7 - 7.7 K/uL   Lymphocytes Relative 22 %   Lymphs Abs 4.0 0.7 - 4.0 K/uL   Monocytes Relative 5 %   Monocytes Absolute 0.9 0.1 - 1.0 K/uL   Eosinophils Relative 7 %   Eosinophils Absolute 1.3 (H) 0.0 - 0.5 K/uL   Basophils Relative 1 %   Basophils Absolute 0.1 0.0 - 0.1 K/uL   Immature Granulocytes 1 %   Abs Immature Granulocytes 0.10 (H) 0.00 - 0.07 K/uL    Comment: Performed at [facility], [address] [place], [place] [address]  Comprehensive metabolic panel     Status: Abnormal   Collection Time: 09/08/21  6:08 AM  Result Value Ref Range   Sodium 139 135 - 145  mmol/L   Potassium 4.3 3.5 - 5.1 mmol/L   Chloride 106 98 - 111 mmol/L   CO2 24 22 - 32 mmol/L   Glucose, Bld 159 (H) 70 - 99 mg/dL    Comment: Glucose reference range applies only to samples taken after fasting for at least 8 hours.   BUN 19 8 - 23 mg/dL   Creatinine, Ser 1.34 (H) 0.44 - 1.00 mg/dL   Calcium 8.4 (L) 8.9 - 10.3 mg/dL   Total Protein 6.7 6.5 - 8.1 g/dL   Albumin 3.3 (L) 3.5 - 5.0 g/dL   AST 24 15 - 41 U/L   ALT 16 0 - 44 U/L   Alkaline Phosphatase 98 38 - 126 U/L   Total Bilirubin 0.6 0.3 - 1.2 mg/dL   GFR, Estimated 41 (L) >60 mL/min    Comment: (NOTE) Calculated using the CKD-EPI Creatinine Equation (2021)    Anion gap 9 5 - 15    Comment: Performed at [facility], [address] [place], [place] [address]  Blood gas, arterial     Status: Abnormal   Collection Time: 09/08/21  6:08 AM  Result Value Ref Range   O2 Content 3.0 L/min   Delivery systems NASAL CANNULA    pH, Arterial 7.43 7.35 - 7.45   pCO2 arterial 40 32 - 48 mmHg   pO2, Arterial 67 (L) 83 - 108 mmHg   Bicarbonate 26.5 20.0 - 28.0 mmol/L   Acid-Base Excess 2.0 0.0 - 2.0 mmol/L   O2 Saturation 94.8 %   Patient temperature 37.0    Collection site RIGHT RADIAL    Allens test (pass/fail) PASS PASS    Comment: Performed at [facility], [place], [place] [address]  Troponin I (High Sensitivity)     Status: None   Collection Time: 09/08/21  6:08 AM  Result Value Ref Range   Troponin I (High Sensitivity) 16 <18 ng/L    Comment: (NOTE) Elevated high sensitivity troponin I (hsTnI) values and significant  changes across serial measurements may suggest ACS but many other  chronic and acute conditions are known to elevate hsTnI results.  Refer to the "Links" section for chest pain algorithms and additional  guidance. Performed at [facility], [address] [place], [place] [address]   Brain natriuretic peptide     Status: Abnormal   Collection Time:  09/08/21  6:08 AM  Result Value Ref Range   B Natriuretic Peptide 345.9 (H) 0.0 - 100.0 pg/mL  Comment: Performed at [facility], [address] [place], [place] [address]  SARS Coronavirus 2 by RT PCR (hospital order, performed in [facility] hospital lab) *cepheid single result test* Anterior Nasal Swab     Status: None   Collection Time: 09/08/21  6:08 AM   Specimen: Anterior Nasal Swab  Result Value Ref Range   SARS Coronavirus 2 by RT PCR NEGATIVE NEGATIVE    Comment: (NOTE) SARS-CoV-2 target nucleic acids are NOT DETECTED.  The SARS-CoV-2 RNA is generally detectable in upper and lower respiratory specimens during the acute phase of infection. The lowest concentration of SARS-CoV-2 viral copies this assay can detect is 250 copies / mL. A negative result does not preclude SARS-CoV-2 infection and should not be used as the sole basis for treatment or other patient management decisions.  A negative result may occur with improper specimen collection / handling, submission of specimen other than nasopharyngeal swab, presence of viral mutation(s) within the areas targeted by this assay, and inadequate number of viral copies (<250 copies / mL). A negative result must be combined with clinical observations, patient history, and epidemiological information.  Fact Sheet for Patients:   https://www.patel.info/  Fact Sheet for Healthcare Providers: https://hall.com/  This test is not yet approved or  cleared by the [place] FDA and has been authorized for detection and/or diagnosis of SARS-CoV-2 by FDA under an Emergency Use Authorization (EUA).  This EUA will remain in effect (meaning this test can be used) for the duration of the COVID-19 declaration under Section 564(b)(1) of the Act, 21 U.S.C. section 360bbb-3(b)(1), unless the authorization is terminated or revoked sooner.  Performed at [facility], [address] [place], [place] [address]   Urinalysis, Routine w reflex microscopic     Status: Abnormal   Collection Time: 09/08/21  7:26 AM  Result Value Ref Range   Color, Urine YELLOW (A) YELLOW   APPearance CLEAR (A) CLEAR   Specific Gravity, Urine 1.013 1.005 - 1.030   pH 5.0 5.0 - 8.0   Glucose, UA NEGATIVE NEGATIVE mg/dL   Hgb urine dipstick NEGATIVE NEGATIVE   Bilirubin Urine NEGATIVE NEGATIVE   Ketones, ur NEGATIVE NEGATIVE mg/dL   Protein, ur NEGATIVE NEGATIVE mg/dL   Nitrite NEGATIVE NEGATIVE   Leukocytes,Ua NEGATIVE NEGATIVE    Comment: Performed at [facility], [address], [place], [place] [address]  Urine Culture     Status: None   Collection Time: 09/08/21  7:26 AM   Specimen: Urine, Random  Result Value Ref Range   Specimen Description      URINE, RANDOM Performed at [facility], [address] [place], [place] [address]    Special Requests      NONE Performed at [facility], [address] [place], [place] [address]    Culture      NO GROWTH Performed at [facility], [place] [address] [place], [place] [address]    Report Status 09/09/2021 FINAL   Blood culture (routine x 2)     Status: None   Collection Time: 09/08/21  8:36 AM   Specimen: BLOOD  Result Value Ref Range   Specimen Description BLOOD RIGHT ANTECUBITAL    Special Requests      BOTTLES DRAWN AEROBIC AND ANAEROBIC Blood Culture results may not be optimal due to an inadequate volume of blood received in culture bottles   Culture      NO GROWTH 5 DAYS Performed at [facility], [place], [place] [address]    Report Status 09/13/2021 FINAL   Blood culture (routine x 2)     Status: None   Collection Time: 09/08/21  8:36 AM   Specimen: BLOOD  Result Value Ref Range   Specimen Description BLOOD LEFT ANTECUBITAL    Special Requests      BOTTLES DRAWN AEROBIC AND ANAEROBIC Blood Culture results may not be optimal due to an  inadequate volume of blood received in culture bottles   Culture      NO GROWTH 5 DAYS Performed at [facility], [address], [place], [place] [address]    Report Status 09/13/2021 FINAL   Lactic acid, plasma     Status: Abnormal   Collection Time: 09/08/21  8:36 AM  Result Value Ref Range   Lactic Acid, Venous 3.1 (HH) 0.5 - 1.9 mmol/L    Comment: CRITICAL RESULT CALLED TO, READ BACK BY AND VERIFIED WITH Violet Baldy RN [phone] 09/08/21 HNM Performed at [facility], [place], [place] [address]   Troponin I (High Sensitivity)     Status: None   Collection Time: 09/08/21  8:36 AM  Result Value Ref Range   Troponin I (High Sensitivity) 17 <18 ng/L    Comment: (NOTE) Elevated high sensitivity troponin I (hsTnI) values and significant  changes across serial measurements may suggest ACS but many other  chronic and acute conditions are known to elevate hsTnI results.  Refer to the "Links" section for chest pain algorithms and additional  guidance. Performed at [facility], [address] [place], [place] [address]   Procalcitonin     Status: None   Collection Time: 09/08/21  8:36 AM  Result Value Ref Range   Procalcitonin <0.10 ng/mL    Comment:        Interpretation: PCT (Procalcitonin) <= 0.5 ng/mL: Systemic infection (sepsis) is not likely. Local bacterial infection is possible. (NOTE)       Sepsis PCT Algorithm           Lower Respiratory Tract                                      Infection PCT Algorithm    ----------------------------     ----------------------------         PCT < 0.25 ng/mL                PCT < 0.10 ng/mL          Strongly encourage             Strongly discourage   discontinuation of antibiotics    initiation of antibiotics    ----------------------------     -----------------------------       PCT 0.25 - 0.50 ng/mL            PCT 0.10 - 0.25 ng/mL               OR       >80% decrease in PCT             Discourage initiation of                                            antibiotics      Encourage  discontinuation           of antibiotics    ----------------------------     -----------------------------         PCT >= 0.50 ng/mL              PCT 0.26 - 0.50 ng/mL               AND        <80% decrease in PCT             Encourage initiation of                                             antibiotics       Encourage continuation           of antibiotics    ----------------------------     -----------------------------        PCT >= 0.50 ng/mL                  PCT > 0.50 ng/mL               AND         increase in PCT                  Strongly encourage                                      initiation of antibiotics    Strongly encourage escalation           of antibiotics                                     -----------------------------                                           PCT <= 0.25 ng/mL                                                 OR                                        > 80% decrease in PCT                                      Discontinue / Do not initiate                                             antibiotics  Performed at [facility], [address] [place], [place] [address]   Lactic acid, plasma     Status: Abnormal   Collection Time: 09/08/21 11:28 AM  Result Value Ref  Range   Lactic Acid, Venous 3.8 (HH) 0.5 - 1.9 mmol/L    Comment: CRITICAL VALUE NOTED. VALUE IS CONSISTENT WITH PREVIOUSLY REPORTED/CALLED VALUE SRR Performed at [facility], [address] [place], [place] [address]   Lactic acid, plasma     Status: Abnormal   Collection Time: 09/08/21  9:03 PM  Result Value Ref Range   Lactic Acid, Venous 3.2 (HH) 0.5 - 1.9 mmol/L    Comment: CRITICAL VALUE NOTED. VALUE IS CONSISTENT WITH PREVIOUSLY REPORTED/CALLED VALUE SKL Performed at [facility], [address] [place], [place] [address]   CBC     Status: Abnormal    Collection Time: 09/09/21  4:47 AM  Result Value Ref Range   WBC 17.0 (H) 4.0 - 10.5 K/uL   RBC 3.07 (L) 3.87 - 5.11 MIL/uL   Hemoglobin 6.9 (L) 12.0 - 15.0 g/dL    Comment: Reticulocyte Hemoglobin testing may be clinically indicated, consider ordering this additional test [id number]    HCT 23.2 (L) 36.0 - 46.0 %   MCV 75.6 (L) 80.0 - 100.0 fL   MCH 22.5 (L) 26.0 - 34.0 pg   MCHC 29.7 (L) 30.0 - 36.0 g/dL   RDW 53.5 (H) 39.9 - 65.6 %   Platelets 231 150 - 400 K/uL   nRBC 0.0 0.0 - 0.2 %    Comment: Performed at [facility], [address] [place], [place] [address]  Basic metabolic panel     Status: Abnormal   Collection Time: 09/09/21  4:47 AM  Result Value Ref Range   Sodium 139 135 - 145 mmol/L   Potassium 4.1 3.5 - 5.1 mmol/L   Chloride 104 98 - 111 mmol/L   CO2 26 22 - 32 mmol/L   Glucose, Bld 141 (H) 70 - 99 mg/dL    Comment: Glucose reference range applies only to samples taken after fasting for at least 8 hours.   BUN 23 8 - 23 mg/dL   Creatinine, Ser 7.33 (H) 0.44 - 1.00 mg/dL   Calcium 8.1 (L) 8.9 - 10.3 mg/dL   GFR, Estimated 39 (L) >60 mL/min    Comment: (NOTE) Calculated using the CKD-EPI Creatinine Equation (2021)    Anion gap 9 5 - 15    Comment: Performed at [facility], [address] [place], [place] [address]  Prepare RBC (crossmatch)     Status: None   Collection Time: 09/09/21 12:00 PM  Result Value Ref Range   Order Confirmation      ORDER PROCESSED BY BLOOD BANK Performed at [facility], [address] [place], [place] [address]   Type and screen [facility]     Status: None   Collection Time: 09/09/21  1:04 PM  Result Value Ref Range   ABO/RH(D) AB POS    Antibody Screen NEG    Sample Expiration 09/12/2021,2359    Unit Number [id number]    Blood Component Type RED CELLS,LR    Unit division 00    Status of Unit ISSUED,FINAL    Transfusion Status OK TO TRANSFUSE    Crossmatch Result       Compatible Performed at [facility], [address] [place], [place] [address]   BPAM RBC     Status: None   Collection Time: 09/09/21  1:04 PM  Result Value Ref Range   ISSUE DATE / TIME [id number]    Blood Product Unit Number [id number]    PRODUCT CODE [id number]  Unit Type and Rh 0600    Blood Product Expiration Date [id number]   Respiratory (~20 pathogens) panel by PCR     Status: None   Collection Time: 09/09/21  2:30 PM   Specimen: Nasopharyngeal Swab; Respiratory  Result Value Ref Range   Adenovirus NOT DETECTED NOT DETECTED   Coronavirus 229E NOT DETECTED NOT DETECTED    Comment: (NOTE) The Coronavirus on the Respiratory Panel, DOES NOT test for the novel  Coronavirus (2019 nCoV)    Coronavirus HKU1 NOT DETECTED NOT DETECTED   Coronavirus NL63 NOT DETECTED NOT DETECTED   Coronavirus OC43 NOT DETECTED NOT DETECTED   Metapneumovirus NOT DETECTED NOT DETECTED   Rhinovirus / Enterovirus NOT DETECTED NOT DETECTED   Influenza A NOT DETECTED NOT DETECTED   Influenza B NOT DETECTED NOT DETECTED   Parainfluenza Virus 1 NOT DETECTED NOT DETECTED   Parainfluenza Virus 2 NOT DETECTED NOT DETECTED   Parainfluenza Virus 3 NOT DETECTED NOT DETECTED   Parainfluenza Virus 4 NOT DETECTED NOT DETECTED   Respiratory Syncytial Virus NOT DETECTED NOT DETECTED   Bordetella pertussis NOT DETECTED NOT DETECTED   Bordetella Parapertussis NOT DETECTED NOT DETECTED   Chlamydophila pneumoniae NOT DETECTED NOT DETECTED   Mycoplasma pneumoniae NOT DETECTED NOT DETECTED    Comment: Performed at [facility], [place] [address], [place], [place] [address]  Basic metabolic panel     Status: Abnormal   Collection Time: 09/10/21  7:52 AM  Result Value Ref Range   Sodium 139 135 - 145 mmol/L   Potassium 4.2 3.5 - 5.1 mmol/L   Chloride 101 98 - 111 mmol/L   CO2 27 22 - 32 mmol/L   Glucose, Bld 208 (H) 70 - 99 mg/dL    Comment: Glucose reference range applies only to samples  taken after fasting for at least 8 hours.   BUN 31 (H) 8 - 23 mg/dL   Creatinine, Ser 1.44 (H) 0.44 - 1.00 mg/dL   Calcium 7.8 (L) 8.9 - 10.3 mg/dL   GFR, Estimated 37 (L) >60 mL/min    Comment: (NOTE) Calculated using the CKD-EPI Creatinine Equation (2021)    Anion gap 11 5 - 15    Comment: Performed at [facility], [address] [place], [place] [address]  CBC     Status: Abnormal   Collection Time: 09/10/21  7:52 AM  Result Value Ref Range   WBC 17.1 (H) 4.0 - 10.5 K/uL   RBC 3.81 (L) 3.87 - 5.11 MIL/uL   Hemoglobin 9.4 (L) 12.0 - 15.0 g/dL    Comment: REPEATED TO VERIFY   HCT 29.5 (L) 36.0 - 46.0 %   MCV 77.4 (L) 80.0 - 100.0 fL   MCH 24.7 (L) 26.0 - 34.0 pg   MCHC 31.9 30.0 - 36.0 g/dL   RDW 17.9 (H) 11.5 - 15.5 %   Platelets 287 150 - 400 K/uL   nRBC 0.0 0.0 - 0.2 %    Comment: Performed at [facility], [address] [place], [place] [address]  Magnesium     Status: None   Collection Time: 09/10/21  7:52 AM  Result Value Ref Range   Magnesium 2.1 1.7 - 2.4 mg/dL    Comment: Performed at [facility], [address] [place], [place] [address]  Expectorated Sputum Assessment w Gram Stain, Rflx to Resp Cult     Status: None   Collection Time: 09/10/21 12:20 PM   Specimen: Expectorated Sputum  Result Value Ref  Range   Specimen Description EXPECTORATED SPUTUM    Special Requests NONE    Sputum evaluation      THIS SPECIMEN IS ACCEPTABLE FOR SPUTUM CULTURE Performed at [facility], [address] [place], [place] [address]    Report Status 09/10/2021 FINAL   Culture, Respiratory w Gram Stain     Status: None   Collection Time: 09/10/21 12:20 PM  Result Value Ref Range   Specimen Description      EXPECTORATED SPUTUM Performed at [facility], [address], [place], [place] [address]    Special Requests      NONE Reflexed from [id number] Performed at [facility], [address] [place], [place]  [address]    Gram Stain      RARE WBC PRESENT, PREDOMINANTLY PMN NO ORGANISMS SEEN Performed at [facility], [place] [address], [place], [place] [address]    Culture RARE CANDIDA ALBICANS    Report Status 09/13/2021 FINAL   CBC     Status: Abnormal   Collection Time: 09/11/21  6:38 AM  Result Value Ref Range   WBC 17.1 (H) 4.0 - 10.5 K/uL   RBC 3.51 (L) 3.87 - 5.11 MIL/uL   Hemoglobin 8.8 (L) 12.0 - 15.0 g/dL   HCT 27.5 (L) 36.0 - 46.0 %   MCV 78.3 (L) 80.0 - 100.0 fL   MCH 25.1 (L) 26.0 - 34.0 pg   MCHC 32.0 30.0 - 36.0 g/dL   RDW 18.3 (H) 11.5 - 15.5 %   Platelets 276 150 - 400 K/uL   nRBC 0.1 0.0 - 0.2 %    Comment: Performed at [facility], [address] [place], [place] [address]  Magnesium     Status: Abnormal   Collection Time: 09/11/21  6:38 AM  Result Value Ref Range   Magnesium 2.5 (H) 1.7 - 2.4 mg/dL    Comment: Performed at [facility], [address], [place], [place] [address]  Ferritin     Status: None   Collection Time: 09/11/21  6:38 AM  Result Value Ref Range   Ferritin 174 11 - 307 ng/mL    Comment: Performed at [facility], [address] [place], [place] [address]  Iron and TIBC     Status: Abnormal   Collection Time: 09/11/21  6:38 AM  Result Value Ref Range   Iron 24 (L) 28 - 170 ug/dL   TIBC 377 250 - 450 ug/dL   Saturation Ratios 6 (L) 10.4 - 31.8 %   UIBC 353 ug/dL    Comment: Performed at [facility], [address] [place], [place] [address]  Retic Panel     Status: Abnormal   Collection Time: 09/11/21  6:38 AM  Result Value Ref Range   Retic Ct Pct 2.2 0.4 - 3.1 %   RBC. 3.44 (L) 3.87 - 5.11 MIL/uL   Retic Count, Absolute 75.3 19.0 - 186.0 K/uL   Immature Retic Fract 43.6 (H) 2.3 - 15.9 %   Reticulocyte Hemoglobin 21.3 (L) >27.9 pg    Comment:        A RET-He < 28 pg is an indication of iron-deficient or iron- insufficient erythropoiesis. Patients with thalassemia may also have a  decreased RET-He result unrelated to iron availability.     If this patient has chronic kidney disease and does not have a hemoglobinopathy he/she meets criteria for iron deficiency per the 2016 NICE guidelines. Refer to specific guidelines to determine  the appropriate thresholds for treating CKD- associated iron deficiency. TSAT and ferritin should be used in patients with hemoglobinopathies (e.g. thalassemia). Performed at [facility], [address] [place], [place] [address]   Comprehensive metabolic panel     Status: Abnormal   Collection Time: 09/11/21  6:38 AM  Result Value Ref Range   Sodium 141 135 - 145 mmol/L   Potassium 3.9 3.5 - 5.1 mmol/L   Chloride 101 98 - 111 mmol/L   CO2 30 22 - 32 mmol/L   Glucose, Bld 201 (H) 70 - 99 mg/dL    Comment: Glucose reference range applies only to samples taken after fasting for at least 8 hours.   BUN 35 (H) 8 - 23 mg/dL   Creatinine, Ser 1.37 (H) 0.44 - 1.00 mg/dL   Calcium 7.7 (L) 8.9 - 10.3 mg/dL   Total Protein 6.2 (L) 6.5 - 8.1 g/dL   Albumin 3.0 (L) 3.5 - 5.0 g/dL   AST 25 15 - 41 U/L   ALT 13 0 - 44 U/L   Alkaline Phosphatase 80 38 - 126 U/L   Total Bilirubin 0.7 0.3 - 1.2 mg/dL   GFR, Estimated 40 (L) >60 mL/min    Comment: (NOTE) Calculated using the CKD-EPI Creatinine Equation (2021)    Anion gap 10 5 - 15    Comment: Performed at [facility], [address] [place], [place] [address]  CBC     Status: Abnormal   Collection Time: 09/12/21  6:45 AM  Result Value Ref Range   WBC 15.7 (H) 4.0 - 10.5 K/uL   RBC 3.92 3.87 - 5.11 MIL/uL   Hemoglobin 9.6 (L) 12.0 - 15.0 g/dL   HCT 31.1 (L) 36.0 - 46.0 %   MCV 79.3 (L) 80.0 - 100.0 fL   MCH 24.5 (L) 26.0 - 34.0 pg   MCHC 30.9 30.0 - 36.0 g/dL   RDW 19.5 (H) 11.5 - 15.5 %   Platelets 312 150 - 400 K/uL   nRBC 0.2 0.0 - 0.2 %    Comment: Performed at [facility], [address] [place], [place] [address]  Magnesium     Status:  Abnormal   Collection Time: 09/12/21  6:45 AM  Result Value Ref Range   Magnesium 2.5 (H) 1.7 - 2.4 mg/dL    Comment: Performed at [facility], [address] [place], [place] [address]  Basic metabolic panel     Status: Abnormal   Collection Time: 09/12/21  6:45 AM  Result Value Ref Range   Sodium 141 135 - 145 mmol/L   Potassium 3.7 3.5 - 5.1 mmol/L   Chloride 104 98 - 111 mmol/L   CO2 29 22 - 32 mmol/L   Glucose, Bld 127 (H) 70 - 99 mg/dL    Comment: Glucose reference range applies only to samples taken after fasting for at least 8 hours.   BUN 29 (H) 8 - 23 mg/dL   Creatinine, Ser 1.18 (H) 0.44 - 1.00 mg/dL   Calcium 7.7 (L) 8.9 - 10.3 mg/dL   GFR, Estimated 47 (L) >60 mL/min    Comment: (NOTE) Calculated using the CKD-EPI Creatinine Equation (2021)    Anion gap 8 5 - 15    Comment: Performed at [facility], [address] [place], [place] [address]   Objective  Body mass index is 33.44 kg/m. Wt Readings from Last 3 Encounters:  09/24/21 177 lb (80.3 kg)  09/12/21 177 lb 0.5 oz (80.3  kg)  09/02/21 175 lb 2 oz (79.4 kg)   Temp Readings from Last 3 Encounters:  09/24/21 98.1 F (36.7 C) (Oral)  09/12/21 98.3 F (36.8 C)  08/22/21 98.1 F (36.7 C)   BP Readings from Last 3 Encounters:  09/24/21 118/60  09/12/21 (!) 165/69  09/02/21 96/76   Pulse Readings from Last 3 Encounters:  09/24/21 84  09/12/21 88  09/02/21 89    Physical Exam Vitals and nursing note reviewed.  Constitutional:      Appearance: Normal appearance. She is well-developed and well-groomed.  HENT:     Head: Normocephalic and atraumatic.  Eyes:     Conjunctiva/sclera: Conjunctivae normal.     Pupils: Pupils are equal, round, and reactive to light.  Cardiovascular:     Rate and Rhythm: Normal rate and regular rhythm.     Heart sounds: Normal heart sounds. No murmur heard. Pulmonary:     Effort: Pulmonary effort is normal.     Breath sounds: Normal breath sounds.   Abdominal:     General: Abdomen is flat. Bowel sounds are normal.     Tenderness: There is no abdominal tenderness.  Musculoskeletal:        General: No tenderness.  Skin:    General: Skin is warm and dry.  Neurological:     General: No focal deficit present.     Mental Status: She is alert and oriented to person, place, and time. Mental status is at baseline.     Cranial Nerves: Cranial nerves 2-12 are intact.     Motor: Motor function is intact.     Coordination: Coordination is intact.     Gait: Gait is intact.  Psychiatric:        Attention and Perception: Attention and perception normal.        Mood and Affect: Mood and affect normal.        Speech: Speech normal.        Behavior: Behavior normal. Behavior is cooperative.        Thought Content: Thought content normal.        Cognition and Memory: Cognition and memory normal.        Judgment: Judgment normal.     Assessment  Plan  Dyspnea on exertion Sob due to multifactorial issues chronic diastolic CHF, ILD, NSIP due to RA, asthma/COPD, recurrent pna, bilateral pleural effusions, ? [place] she is on O2 3L today and today at home O2 was not plugged in and feeling sob, echo with primary cards at [facility] 12/05/21 she just completed steroids in hospital but did not get them outpt there was ? About dosing, Abx and uses inhalers established [facility] as well. Son wants referral to CHF specialist referred [facility] in [place] [facility] 12/05/21  Prn albuterol, pulm neb, duoneb On torsemide 20 mg qd prn F/u pulmonary   Chronic heart failure with preserved ejection fraction ([place]) - Plan: Ambulatory referral to Cardiology HFpEF with pleural effusions  Dr. Cam Hai. Aundra Dubin referred today  ILD (interstitial lung disease) ([place]) - Plan: predniSONE (DELTASONE) 10 MG tablet  COPD exacerbation ([place]) - Plan: predniSONE (DELTASONE) 10 MG tablet  Chronic respiratory failure with hypoxia ([place])   HM See below    Provider: Dr. Olivia Mackie [facility]

## 2021-09-24 NOTE — Patient Instructions (Addendum)
Dr. Faythe Ghee  Phone Fax E-mail Address  [phone] [email or web address] [address]   [place]   [place] [address]     Specialties     Cardiology      [address]. [place]  Phone Fax E-mail Address  [phone] Not available [address]   [place] [address]     Specialties     Oncology        New pcp Dr. Volanda Napoleon  Dr. Nicki Reaper  Dr. Derrel Nip    Take 1 tums daily

## 2021-09-30 DIAGNOSIS — J449 Chronic obstructive pulmonary disease, unspecified: Secondary | ICD-10-CM | POA: Diagnosis not present

## 2021-10-01 DIAGNOSIS — J9611 Chronic respiratory failure with hypoxia: Secondary | ICD-10-CM | POA: Insufficient documentation

## 2021-10-01 HISTORY — DX: Chronic respiratory failure with hypoxia: J96.11

## 2021-10-07 IMAGING — CR DG CHEST 2V
2 series · 2 of 2 positions shown · non-contrast
Comparison: Chest x-ray 03/24/2020.

CLINICAL DATA: 80-year-old female with history of sudden onset of
chest pain and nausea.

EXAM:
CHEST - 2 VIEW

[chest lat]
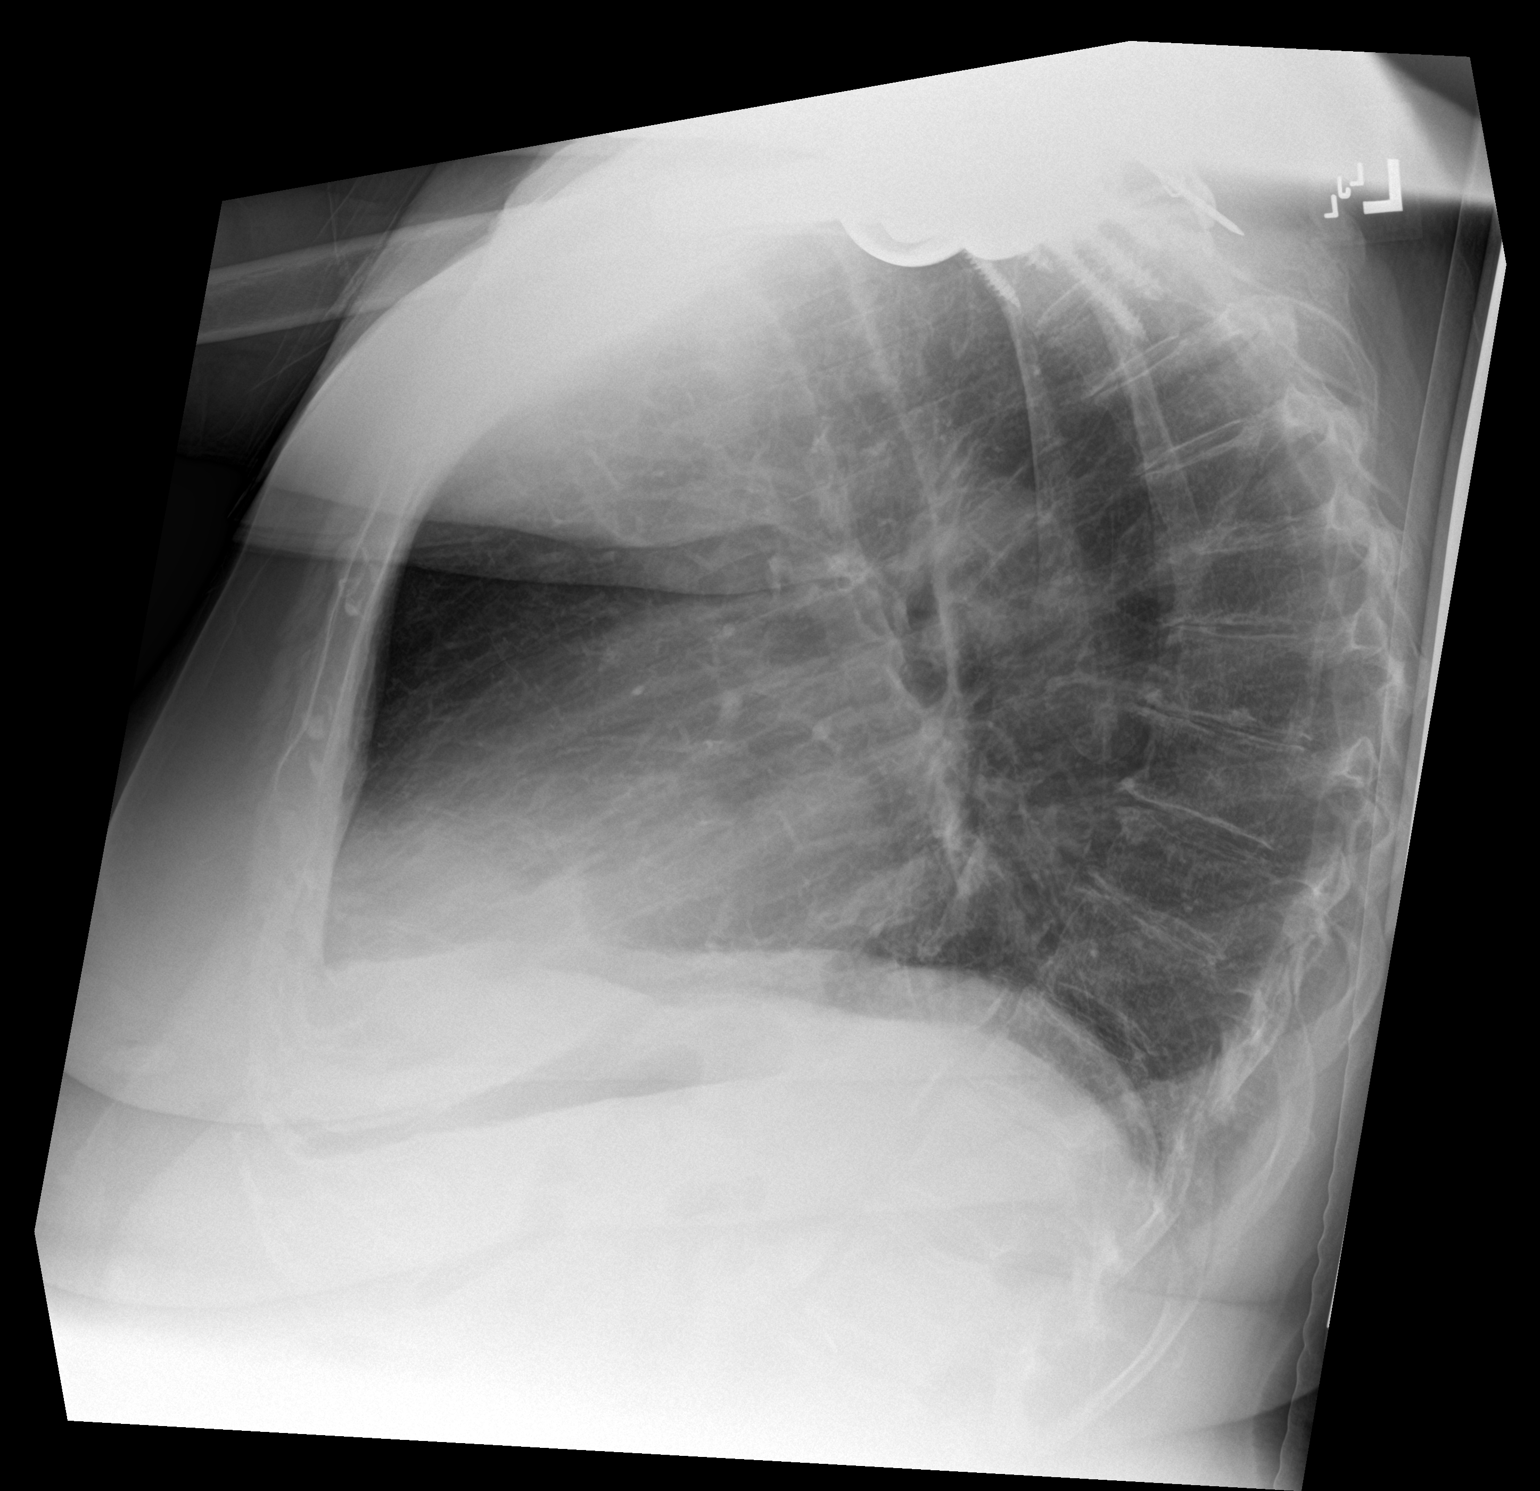

[chest ap]
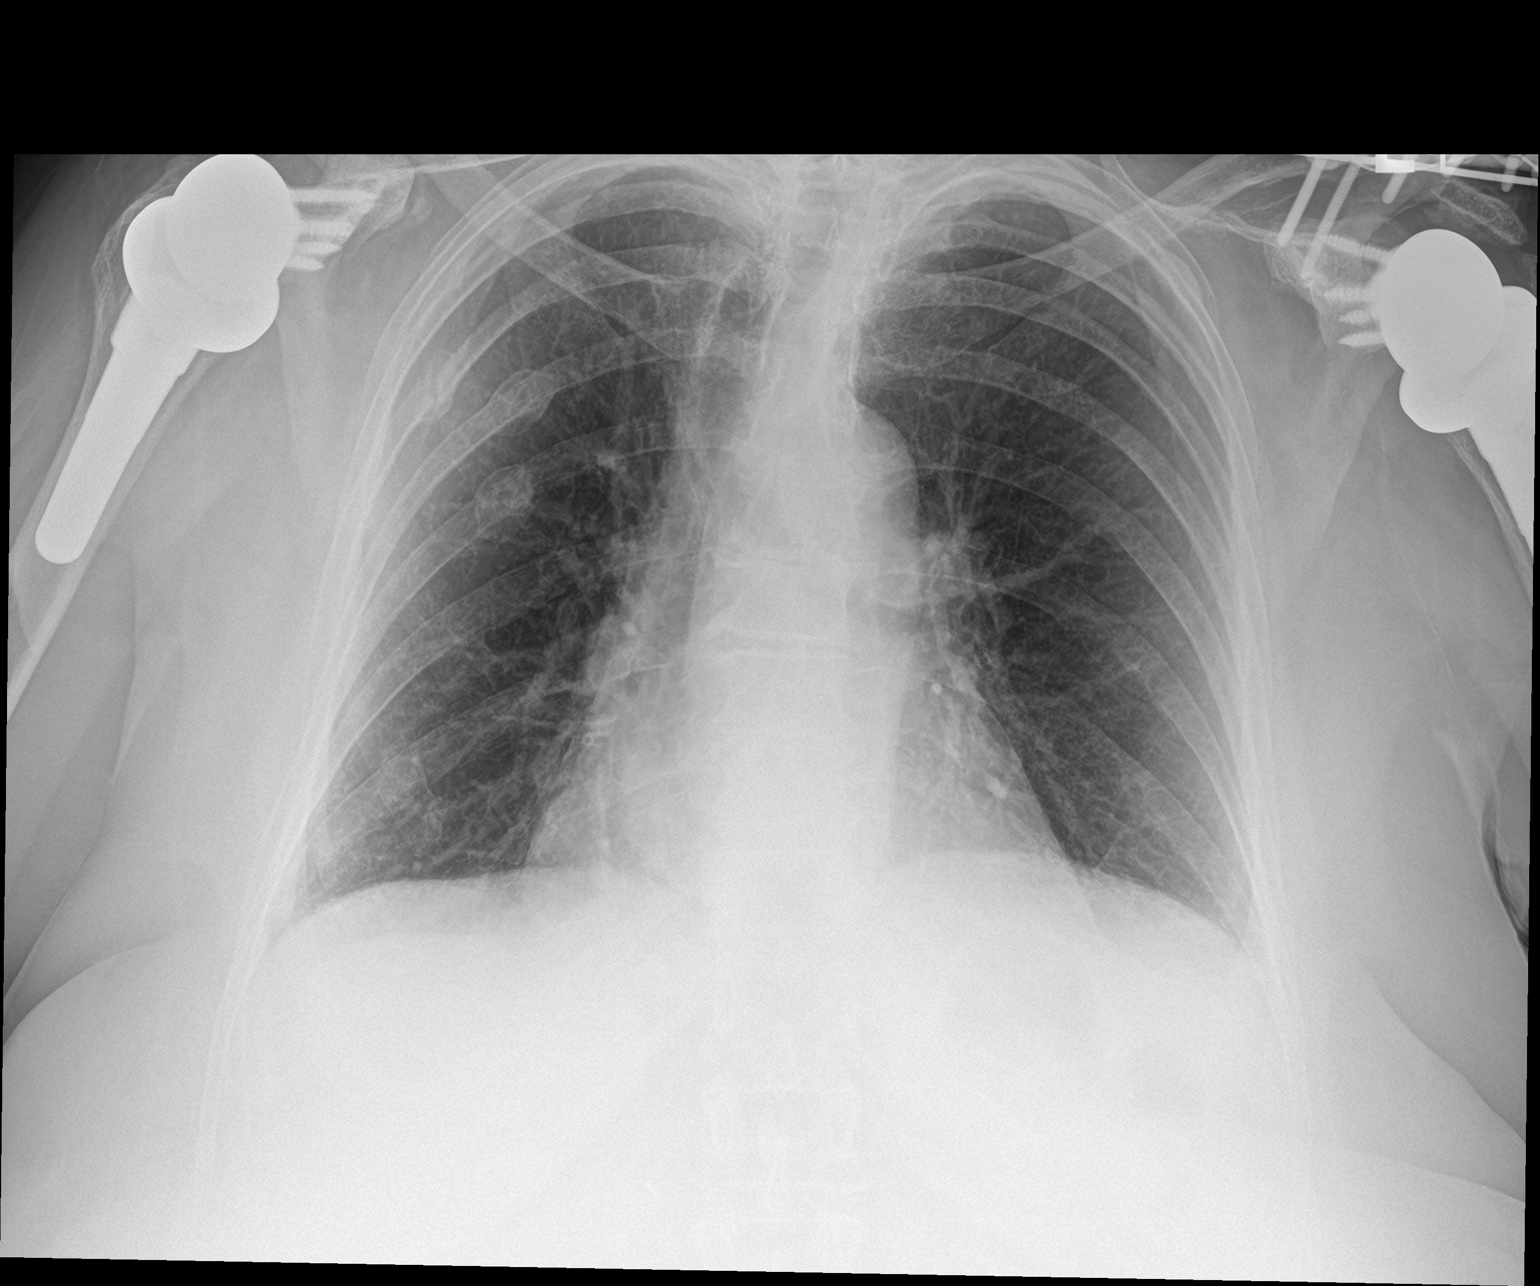

[2 of 2 positions shown; findings below may reference images not displayed]

FINDINGS: Lung volumes are low. No consolidative airspace disease. No pleural
effusions. No pneumothorax. No pulmonary nodule or mass noted.
Pulmonary vasculature and the cardiomediastinal silhouette are
within normal limits. Old healed posterolateral right-sided rib
fractures are again noted. Status post bilateral shoulder
arthroplasty. Status post ORIF in the distal aspect of the left
clavicle and acromion.
IMPRESSION: 1. Low lung volumes without radiographic evidence of acute
cardiopulmonary disease.

## 2021-10-08 ENCOUNTER — Ambulatory Visit: Payer: Medicare PPO | Admitting: Family

## 2021-10-08 ENCOUNTER — Ambulatory Visit: Payer: Medicare PPO | Admitting: Internal Medicine

## 2021-10-09 ENCOUNTER — Inpatient Hospital Stay: Payer: Medicare PPO

## 2021-10-09 ENCOUNTER — Encounter: Payer: Self-pay | Admitting: Nurse Practitioner

## 2021-10-09 ENCOUNTER — Inpatient Hospital Stay: Payer: Medicare PPO | Attending: Oncology | Admitting: Nurse Practitioner

## 2021-10-09 VITALS — BP 120/52 | HR 87 | Temp 98.8°F | Resp 16 | Wt 173.9 lb

## 2021-10-09 DIAGNOSIS — D500 Iron deficiency anemia secondary to blood loss (chronic): Secondary | ICD-10-CM | POA: Insufficient documentation

## 2021-10-09 DIAGNOSIS — Z79899 Other long term (current) drug therapy: Secondary | ICD-10-CM | POA: Insufficient documentation

## 2021-10-09 DIAGNOSIS — D631 Anemia in chronic kidney disease: Secondary | ICD-10-CM

## 2021-10-09 DIAGNOSIS — N1831 Chronic kidney disease, stage 3a: Secondary | ICD-10-CM | POA: Diagnosis not present

## 2021-10-09 DIAGNOSIS — I509 Heart failure, unspecified: Secondary | ICD-10-CM | POA: Diagnosis not present

## 2021-10-09 DIAGNOSIS — Z87891 Personal history of nicotine dependence: Secondary | ICD-10-CM | POA: Insufficient documentation

## 2021-10-09 DIAGNOSIS — R0602 Shortness of breath: Secondary | ICD-10-CM | POA: Diagnosis not present

## 2021-10-09 DIAGNOSIS — D509 Iron deficiency anemia, unspecified: Secondary | ICD-10-CM | POA: Insufficient documentation

## 2021-10-09 DIAGNOSIS — Z886 Allergy status to analgesic agent status: Secondary | ICD-10-CM | POA: Diagnosis not present

## 2021-10-09 DIAGNOSIS — R062 Wheezing: Secondary | ICD-10-CM | POA: Diagnosis not present

## 2021-10-09 DIAGNOSIS — Z881 Allergy status to other antibiotic agents status: Secondary | ICD-10-CM | POA: Diagnosis not present

## 2021-10-09 DIAGNOSIS — Z20822 Contact with and (suspected) exposure to covid-19: Secondary | ICD-10-CM | POA: Diagnosis not present

## 2021-10-09 DIAGNOSIS — Z882 Allergy status to sulfonamides status: Secondary | ICD-10-CM | POA: Diagnosis not present

## 2021-10-09 DIAGNOSIS — Z885 Allergy status to narcotic agent status: Secondary | ICD-10-CM | POA: Diagnosis not present

## 2021-10-09 DIAGNOSIS — M25471 Effusion, right ankle: Secondary | ICD-10-CM | POA: Diagnosis not present

## 2021-10-09 DIAGNOSIS — D72829 Elevated white blood cell count, unspecified: Secondary | ICD-10-CM | POA: Diagnosis not present

## 2021-10-09 DIAGNOSIS — R059 Cough, unspecified: Secondary | ICD-10-CM | POA: Diagnosis not present

## 2021-10-09 DIAGNOSIS — M25472 Effusion, left ankle: Secondary | ICD-10-CM | POA: Diagnosis not present

## 2021-10-09 DIAGNOSIS — J441 Chronic obstructive pulmonary disease with (acute) exacerbation: Secondary | ICD-10-CM | POA: Diagnosis not present

## 2021-10-09 DIAGNOSIS — N1832 Chronic kidney disease, stage 3b: Secondary | ICD-10-CM | POA: Diagnosis not present

## 2021-10-09 DIAGNOSIS — R0600 Dyspnea, unspecified: Secondary | ICD-10-CM | POA: Diagnosis not present

## 2021-10-09 DIAGNOSIS — N189 Chronic kidney disease, unspecified: Secondary | ICD-10-CM | POA: Diagnosis not present

## 2021-10-09 DIAGNOSIS — R600 Localized edema: Secondary | ICD-10-CM | POA: Diagnosis not present

## 2021-10-09 DIAGNOSIS — E049 Nontoxic goiter, unspecified: Secondary | ICD-10-CM | POA: Diagnosis not present

## 2021-10-09 HISTORY — DX: Chronic kidney disease, stage 3a: N18.31

## 2021-10-09 HISTORY — DX: Anemia in chronic kidney disease: D63.1

## 2021-10-09 LAB — CBC WITH DIFFERENTIAL/PLATELET
Abs Immature Granulocytes: 0.14 10*3/uL — ABNORMAL HIGH (ref 0.00–0.07)
Basophils Absolute: 0.1 10*3/uL (ref 0.0–0.1)
Basophils Relative: 1 %
Eosinophils Absolute: 1.1 10*3/uL — ABNORMAL HIGH (ref 0.0–0.5)
Eosinophils Relative: 9 %
HCT: 36.8 % (ref 36.0–46.0)
Hemoglobin: 11.3 g/dL — ABNORMAL LOW (ref 12.0–15.0)
Immature Granulocytes: 1 %
Lymphocytes Relative: 18 %
Lymphs Abs: 2.2 10*3/uL (ref 0.7–4.0)
MCH: 26.1 pg (ref 26.0–34.0)
MCHC: 30.7 g/dL (ref 30.0–36.0)
MCV: 85 fL (ref 80.0–100.0)
Monocytes Absolute: 1.1 10*3/uL — ABNORMAL HIGH (ref 0.1–1.0)
Monocytes Relative: 9 %
Neutro Abs: 7.3 10*3/uL (ref 1.7–7.7)
Neutrophils Relative %: 62 %
Platelets: 220 10*3/uL (ref 150–400)
RBC: 4.33 MIL/uL (ref 3.87–5.11)
RDW: 23 % — ABNORMAL HIGH (ref 11.5–15.5)
WBC: 12 10*3/uL — ABNORMAL HIGH (ref 4.0–10.5)
nRBC: 0 % (ref 0.0–0.2)

## 2021-10-09 LAB — COMPREHENSIVE METABOLIC PANEL
ALT: 14 U/L (ref 0–44)
AST: 24 U/L (ref 15–41)
Albumin: 3.4 g/dL — ABNORMAL LOW (ref 3.5–5.0)
Alkaline Phosphatase: 100 U/L (ref 38–126)
Anion gap: 11 (ref 5–15)
BUN: 25 mg/dL — ABNORMAL HIGH (ref 8–23)
CO2: 29 mmol/L (ref 22–32)
Calcium: 8.7 mg/dL — ABNORMAL LOW (ref 8.9–10.3)
Chloride: 100 mmol/L (ref 98–111)
Creatinine, Ser: 1.68 mg/dL — ABNORMAL HIGH (ref 0.44–1.00)
GFR, Estimated: 31 mL/min — ABNORMAL LOW (ref >60)
Glucose, Bld: 122 mg/dL — ABNORMAL HIGH (ref 70–99)
Potassium: 3.5 mmol/L (ref 3.5–5.1)
Sodium: 140 mmol/L (ref 135–145)
Total Bilirubin: 0.4 mg/dL (ref 0.3–1.2)
Total Protein: 6.8 g/dL (ref 6.5–8.1)

## 2021-10-09 LAB — IRON AND TIBC
Iron: 77 ug/dL (ref 28–170)
Saturation Ratios: 22 % (ref 10.4–31.8)
TIBC: 343 ug/dL (ref 250–450)
UIBC: 266 ug/dL

## 2021-10-09 LAB — RETIC PANEL
Immature Retic Fract: 18.5 % — ABNORMAL HIGH (ref 2.3–15.9)
RBC.: 4.32 MIL/uL (ref 3.87–5.11)
Retic Count, Absolute: 57.9 10*3/uL (ref 19.0–186.0)
Retic Ct Pct: 1.3 % (ref 0.4–3.1)
Reticulocyte Hemoglobin: 29 pg (ref >27.9)

## 2021-10-09 LAB — FOLATE: Folate: 14.4 ng/mL (ref >5.9)

## 2021-10-09 LAB — FERRITIN: Ferritin: 142 ng/mL (ref 11–307)

## 2021-10-09 LAB — VITAMIN B12: Vitamin B-12: 325 pg/mL (ref 180–914)

## 2021-10-09 NOTE — Progress Notes (Signed)
Hematology/Oncology Consult Note [facility] [phone] [phone]   Patient Care Team: McLean-Scocuzza, Nino Glow, MD as PCP - General (Internal Medicine) Dannielle Karvonen, RN as [place] Management  REFERRING PROVIDER: McLean-Scocuzza, Nino Glow, MD   CHIEF COMPLAINTS/REASON FOR VISIT:  Hospital follow up  HISTORY OF PRESENTING ILLNESS:  Kerri Carter is a  81 y.o.  female with PMH listed below who was referred to me for evaluation of leukocytosis.  Reviewed patient' recent labs obtained by PCP.   04/02/2020 CBC showed elevated white count of 12.9.  She had a blood smear which showed no immature cells.  Absolute eosinophilia, mild population consists predominantly of mature segmented neutrophils with mild left shift and reactive changes.  RBCs appear to be microcytic and hypochromic on smear.  Adequate platelets. Previous lab records reviewed. Leukocytosis onset of chronic, duration is since   No aggravating or elevated factors. Associated symptoms or signs:  Denies weight loss, fever, chills,night sweats.  Fatigue Smoking history: Denies History of recent oral steroid use or steroid injection: Patient is currently on prednisone 7.5 mg daily.  She reports that she has been on prednisone for the past and has been on a tapering course. History of recent infection: Denies Autoimmune disease history.  Patient has Has improved arthritis, on Humira.  Interstitial lung disease Valvular heart disease. CHF  BC showed a white count of 12.5, predominantly neutrophilia, new eosinophilia. Peripheral smear showed no blast, left shift, probably due to chronic inflammation due to RA, chronic steroid use Normal LDH, multiple myeloma panel is negative for M protein.  Normal light chain ratio Peripheral blood flow cytometry showed no monoclonal B-cell population.  No loss or aberrant expression of pan T-cell antigen to suggest neoplastic  T-cell response.  No circulating blasts.  Immature granulocytes Eosinophilia, mild probably reactive.  Rule out other etiologies. I will check myeloproliferative disorder work-up.  JAK2 with reflex, BCR ABL 1 FISH,  Interval History: Patient is 81 year old female is followed by Dr. Tasia Catchings for history of leukocytosis who presents to clinic for hospital follow-up.  She presented to ER on 09/08/2021 with respiratory distress.  Has a history of frequent COPD exacerbations.  She required BiPAP and then weaned to 5 L.  Respiratory distress thought to be related to CHF and COPD in the setting of interstitial lung disease.  Sputum culture with rare yeast only.  She was discharged on a steroid taper and antibiotics as well as torsemide for diuresis.  Hemoglobin was around 12 in December 2022 but more recently had trended to 8-9.  During hospitalization hemoglobin was 6.9 and she received 1 unit PRBCs on 8/8.  Ferritin was normal but serum iron and iron saturation were low with associated microcytosis, hypoproliferative, and RDW was elevated.  Thought to be secondary to GI blood losses in the setting of Eliquis.  Anticoagulation was continued and she was referred to GI.  On 09/12/2021 hemoglobin was 9.6, ferritin 174, iron saturation 6%, reticulocyte hemoglobin 21.3.  Additionally, she has a history of CKD, stage IIIb.  She was not seen by hematology during hospitalization.  Review of Systems  Constitutional:  Positive for fatigue. Negative for appetite change, chills, fever and unexpected weight change.  HENT:   Negative for hearing loss and voice change.   Eyes:  Negative for eye problems.  Respiratory:  Negative for chest tightness and cough.   Cardiovascular:  Positive for leg swelling. Negative for chest pain.  Gastrointestinal:  Negative for abdominal  distention, abdominal pain and blood in stool.  Endocrine: Negative for hot flashes.  Genitourinary:  Negative for difficulty urinating and frequency.    Musculoskeletal:  Positive for arthralgias.  Skin:  Negative for itching and rash.  Neurological:  Negative for extremity weakness.  Hematological:  Negative for adenopathy.  Psychiatric/Behavioral:  Negative for confusion.      MEDICAL HISTORY:  Past Medical History:  Diagnosis Date   Anxiety    Aortic stenosis 07/09/2015   a.) TTE 07/06/2015: EF 55-60%; mild AS with MPG 13.0 mmHg.   Arrhythmia    atrial fibrillation   Asthma    Bipolar disorder ([place])    C. difficile diarrhea 2010   a.) following ABX course during hospital admission   Carotid atherosclerosis, bilateral    a.) Moderate; < 50% stenosis BILATERAL ICAs.   Cataract    a.) s/p BILATERAL extraction   CHF (congestive heart failure) (HCC)    Chicken pox    CKD (chronic kidney disease), stage III (HCC)    a. s/p R nephrectomy./ aneurysm   Conversion disorder    COPD (chronic obstructive pulmonary disease) (HCC)    Depression    Essential hypertension    GERD (gastroesophageal reflux disease)    Heart murmur    Hyperlipidemia    ILD (interstitial lung disease) (HCC)    mild; 2/2 RA diagnosis   Inflammatory arthritis    a. hands/carpal tunnel.  b. Low titer rheumatoid factor. c. Negative anti-CCP antibodies. d. Plaquenil.   Macular degeneration    Nocturnal hypoxemia    Non-Obstructive CAD    a. 07/2009 Cath ([facility]): nonobs dzs;  b. 03/2011 Cath [facility]): nonobs dzs.   Osteoarthritis    a. Knees.   PAD (peripheral artery disease) (HCC)    PUD (peptic ulcer disease)    S/P right hip fracture    11/01/16 s/p repair   Shoulder pain    Sleep apnea    no cpap / minimal   Spinal stenosis at L4-L5 level    severe with L4/L5 anterolisthesis grade 1 anterolisthesis    Toxic maculopathy    Valvular heart disease    a.) TTE 07/06/2015: EF 55-60%; Mild MR/AR/TR; Mild AS with MPG 13.0 mmHg.    SURGICAL HISTORY: Past Surgical History:  Procedure Laterality Date   APPENDECTOMY     APPLICATION OF INTRAOPERATIVE CT  SCAN N/A 05/23/2021   Procedure: APPLICATION OF INTRAOPERATIVE CT SCAN;  Surgeon: Meade Maw, MD;  Location: [facility] ORS;  Service: Neurosurgery;  Laterality: N/A;   BACK SURGERY     lumbar fusion   BUNIONECTOMY Right    CATARACT EXTRACTION, BILATERAL     CESAREAN SECTION     x1   CHOLECYSTECTOMY N/A 05/11/2016   Procedure: LAPAROSCOPIC CHOLECYSTECTOMY;  Surgeon: Florene Glen, MD;  Location: [facility];  Service: General;  Laterality: N/A;   COLONOSCOPY WITH PROPOFOL N/A 04/02/2016   Procedure: COLONOSCOPY WITH PROPOFOL;  Surgeon: Jonathon Bellows, MD;  Location: [facility] ENDOSCOPY;  Service: Endoscopy;  Laterality: N/A;   ENDOSCOPIC RETROGRADE CHOLANGIOPANCREATOGRAPHY (ERCP) WITH PROPOFOL N/A 05/08/2016   Procedure: ENDOSCOPIC RETROGRADE CHOLANGIOPANCREATOGRAPHY (ERCP) WITH PROPOFOL;  Surgeon: Lucilla Lame, MD;  Location: [facility] ENDOS[facility];  Service: Endoscopy;  Laterality: N/A;   ERCP     with biliary spincterotomy 05/08/16 Dr.  Norris for choledocholithiasis    ESOPHAGEAL DILATION  04/02/2016   Procedure: ESOPHAGEAL DILATION;  Surgeon: Jonathon Bellows, MD;  Location: [facility] ENDOS[facility];  Service: Endoscopy;;   ESOPHAGOGASTRODUODENOSCOPY (EGD) WITH PROPOFOL N/A 04/02/2016   Procedure:  ESOPHAGOGASTRODUODENOSCOPY (EGD) WITH PROPOFOL;  Surgeon: Jonathon Bellows, MD;  Location: [facility]COPY;  Service: Endoscopy;  Laterality: N/A;   HIP ARTHROPLASTY Right 11/01/2016   Procedure: ARTHROPLASTY BIPOLAR HIP (HEMIARTHROPLASTY);  Surgeon: Corky Mull, MD;  Location: [facility];  Service: Orthopedics;  Laterality: Right;   LUMBAR LAMINECTOMY/DECOMPRESSION MICRODISCECTOMY N/A 12/11/2020   Procedure: LEFT L2-3 MICRODISCECTOMY, L3-4 AND L5-S1 DECOMPRESSION;  Surgeon: Meade Maw, MD;  Location: [facility];  Service: Neurosurgery;  Laterality: N/A;   NEPHRECTOMY  1988   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   ORIF SCAPHOID FRACTURE Left 12/21/2019   Procedure: OPEN REDUCTION INTERNAL FIXATION (ORIF) OF LEFT  SCAPULAR NONUNION WITH BONE GRAFT;  Surgeon: Corky Mull, MD;  Location: [facility];  Service: Orthopedics;  Laterality: Left;   osteoporosis     noted DEXA 08/19/16    REPLACEMENT TOTAL KNEE Right    REVERSE SHOULDER ARTHROPLASTY Right 11/04/2017   Procedure: REVERSE SHOULDER ARTHROPLASTY;  Surgeon: Corky Mull, MD;  Location: [facility];  Service: Orthopedics;  Laterality: Right;   REVERSE SHOULDER ARTHROPLASTY Left 07/26/2018   Procedure: REVERSE SHOULDER ARTHROPLASTY;  Surgeon: Corky Mull, MD;  Location: [facility];  Service: Orthopedics;  Laterality: Left;   TONSILLECTOMY     TOTAL HIP ARTHROPLASTY  12/10/11   [facility] left hip   TOTAL HIP ARTHROPLASTY Bilateral    TRANSFORAMINAL LUMBAR INTERBODY FUSION (TLIF) WITH PEDICLE SCREW FIXATION 3 LEVEL N/A 05/23/2021   Procedure: OPEN L2-4 TRANSFORAMINAL LUMBAR INTERBODY FUSION (TLIF) WITH L2-5 POSTERIOR SPINAL FUSION;  Surgeon: Meade Maw, MD;  Location: [facility];  Service: Neurosurgery;  Laterality: N/A;   TUBAL LIGATION      SOCIAL HISTORY: Social History   Socioeconomic History   Marital status: Widowed    Spouse name: richard   Number of children: 2   Years of education: Some Coll   Highest education level: Some college, no degree  Occupational History   Occupation: Retired    Comment: retired  Tobacco Use   Smoking status: Former    Packs/day: 0.50    Years: 20.00    Total pack years: 10.00    Types: Cigarettes    Quit date: 02/02/1974    Years since quitting: 47.7   Smokeless tobacco: Never  Vaping Use   Vaping Use: Never used  Substance and Sexual Activity   Alcohol use: No   Drug use: No   Sexual activity: Not Currently  Other Topics Concern   Not on file  Social History Narrative   Lives at home in [place] grandson, his wife and their child.   Husband Mileydi Milsap died covid 75 late 1/2041mrried x 541years   Right-handed.   6 cups coffee per day.   Social Determinants of Health   Financial Resource  Strain: Low Risk  (06/18/2021)   Overall Financial Resource Strain (CARDIA)    Difficulty of Paying Living Expenses: Not hard at all  Food Insecurity: No Food Insecurity (06/18/2021)   Hunger Vital Sign    Worried About Running Out of Food in the Last Year: Never true    Ran Out of Food in the Last Year: Never true  Transportation Needs: Unmet Transportation Needs (06/26/2021)   PRAPARE - Transportation    Lack of Transportation (Medical): Yes    Lack of Transportation (Non-Medical): Yes  Physical Activity: Inactive (11/24/2019)   Exercise Vital Sign    Days of Exercise per Week: 0 days    Minutes of Exercise per  Session: 0 min  Stress: No Stress Concern Present (06/18/2021)   [place]    Feeling of Stress : Not at all  Social Connections: Moderately Integrated (06/18/2021)   Social Connection and Isolation Panel [NHANES]    Frequency of Communication with Friends and Family: More than three times a week    Frequency of Social Gatherings with Friends and Family: Never    Attends Religious Services: More than 4 times per year    Active Member of [employer] or Organizations: No    Attends Archivist Meetings: Never    Marital Status: Married  Human resources officer Violence: Not At Risk (06/18/2021)   Humiliation, Afraid, Rape, and Kick questionnaire    Fear of Current or Ex-Partner: No    Emotionally Abused: No    Physically Abused: No    Sexually Abused: No    FAMILY HISTORY: Family History  Problem Relation Age of Onset   Rheum arthritis Mother    Asthma Mother    Parkinson's disease Mother    Heart disease Mother    Stroke Mother    Hypertension Mother    Heart attack Father    Heart disease Father    Hypertension Father    Peripheral Artery Disease Father    Diabetes Son    Gout Son    Asthma Sister    Heart disease Sister    Lung cancer Sister    Heart disease Sister    Heart disease Sister     Breast cancer Sister    Heart attack Sister    Heart disease Brother    Heart disease Maternal Grandmother    Diabetes Maternal Grandmother    Colon cancer Maternal Grandmother    Cancer Maternal Grandmother        Hodgkins lymphoma   Heart disease Brother    Alcohol abuse Brother    Depression Brother    Dementia Son     ALLERGIES:  is allergic to ceftin [cefuroxime axetil]; lisinopril; sulfa antibiotics; sulfasalazine; morphine; xarelto [rivaroxaban]; adhesive [tape]; antihistamines, chlorpheniramine-type; antivert [meclizine hcl]; aspirin; contrast media [iodinated contrast media]; decongestant [pseudoephedrine hcl]; doxycycline; levaquin [levofloxacin in d5w]; polymyxin b; and tetanus toxoids.  MEDICATIONS:  Current Outpatient Medications  Medication Sig Dispense Refill   albuterol (VENTOLIN HFA) 108 (90 Base) MCG/ACT inhaler Inhale 2 puffs into the lungs every 6 (six) hours as needed for wheezing or shortness of breath. 18 g 11   amLODipine (NORVASC) 5 MG tablet Take 1 tablet (5 mg total) by mouth daily. 90 tablet 3   apixaban (ELIQUIS) 5 MG TABS tablet Take 1 tablet (5 mg total) by mouth 2 (two) times daily. 120 tablet 0   budesonide (PULMICORT) 0.25 MG/2ML nebulizer solution USE 1 VIAL  IN  NEBULIZER TWICE  DAILY - rinse mouth after treatment 120 mL 11   busPIRone (BUSPAR) 5 MG tablet Take 1 tablet (5 mg total) by mouth 2 (two) times daily.     Cholecalciferol (VITAMIN D3) 50 MCG (2000 UT) TABS Take 4,000 Units by mouth daily. 90 tablet 3   dicyclomine (BENTYL) 10 MG capsule Take 1 capsule (10 mg total) by mouth 2 (two) times daily as needed for spasms. (Patient taking differently: Take 20 mg by mouth 2 (two) times daily.) 180 capsule 3   escitalopram (LEXAPRO) 10 MG tablet Take 1 tablet (10 mg total) by mouth daily. 90 tablet 3   etodolac (LODINE) 500 MG tablet Take 1 tablet by mouth  daily.     famotidine (PEPCID) 20 MG tablet TAKE 1 TABLET (20 MG TOTAL) BY MOUTH DAILY. BEFORE  BREAKFAST OR DINNER 90 tablet 1   ferrous sulfate 325 (65 FE) MG EC tablet Take 1 tablet (325 mg total) by mouth 2 (two) times daily. (Patient taking differently: Take 325 mg by mouth 2 (two) times daily. Kerri Carter reports MD instructed her to take twice a day every other day.) 60 tablet 3   fluticasone (FLONASE) 50 MCG/ACT nasal spray Place 2 sprays into both nostrils daily. In am     gabapentin (NEURONTIN) 300 MG capsule TAKE 2 CAPSULES (600 MG TOTAL) BY MOUTH IN THE MORNING AND AT BEDTIME. 360 capsule 2   ipratropium-albuterol (DUONEB) 0.5-2.5 (3) MG/3ML SOLN Take 3 mLs by nebulization 3 (three) times daily as needed. 360 mL 3   lamoTRIgine (LAMICTAL) 100 MG tablet TAKE 1 TAB 2 TIMES DAILY. FURTHER REFILLS NEW PSYCH FOR ALL PSYCH MEDS ONLY TEMP SUPPLY FROM PCP 180 tablet 1   leflunomide (ARAVA) 20 MG tablet Take 1 tablet (20 mg total) by mouth daily.     lidocaine (LIDODERM) 5 % Place 1 patch onto the skin 2 (two) times daily as needed. Remove & Discard patch within 12 hours or as directed by MD (Patient taking differently: Place 1 patch onto the skin daily. Remove & Discard patch within 12 hours or as directed by MD) 120 patch 2   lovastatin (MEVACOR) 20 MG tablet TAKE 1 TABLET BY MOUTH EVERYDAY AT BEDTIME *STOP TALKING 40MG* (Patient taking differently: Take 20 mg by mouth at bedtime.) 90 tablet 2   methocarbamol (ROBAXIN) 500 MG tablet Take 500 mg by mouth 4 (four) times daily as needed. Taking twice a day currently     mirabegron ER (MYRBETRIQ) 50 MG TB24 tablet Take 1 tablet (50 mg total) by mouth daily. 90 tablet 3   montelukast (SINGULAIR) 10 MG tablet TAKE 1 TABLET BY MOUTH EVERY DAY (Patient taking differently: Take 10 mg by mouth daily.) 90 tablet 1   multivitamin-lutein (OCUVITE-LUTEIN) CAPS capsule Take 1 capsule by mouth at bedtime.     nebivolol (BYSTOLIC) 5 MG tablet Take 1 tablet (5 mg total) by mouth in the morning and at bedtime. (Note dose changed from 1/2 10 mg bid to 5 mg bid) 180  tablet 3   OXYGEN Inhale 2 L into the lungs at bedtime.     pantoprazole (PROTONIX) 40 MG tablet TAKE 1 TABLET BY MOUTH 2 TIMES DAILY 30 MIN BEFORE FOOD (NOTE REDUCTION IN FREQUENCY) 180 tablet 1   QUEtiapine (SEROQUEL) 25 MG tablet TAKE 1 TABLET (25 MG TOTAL) BY MOUTH AT BEDTIME. AGAIN LAST FILL FURTHER REFILLS FROM PSYCHIATRY NO EXCEPTIONS 90 tablet 0   sucralfate (CARAFATE) 1 g tablet Take 1 tablet (1 g total) by mouth 2 (two) times daily. 180 tablet 3   torsemide (DEMADEX) 20 MG tablet Take 1 tablet (20 mg total) by mouth daily. 30 tablet 3   zoledronic acid (RECLAST) 5 MG/100ML SOLN injection Inject 5 mg into the vein once.     No current facility-administered medications for this visit.     PHYSICAL EXAMINATION: ECOG PERFORMANCE STATUS: 2 - Symptomatic, <50% confined to bed Vitals:   10/09/21 1020  BP: (!) 120/52  Pulse: 87  Resp: 16  Temp: 98.8 F (37.1 C)  SpO2: 94%   Filed Weights   10/09/21 1020  Weight: 173 lb 14.4 oz (78.9 kg)    Physical Exam Constitutional:  General: She is not in acute distress.    Comments: Patient sits in the wheelchair  HENT:     Head: Normocephalic and atraumatic.  Eyes:     General: No scleral icterus. Cardiovascular:     Rate and Rhythm: Normal rate and regular rhythm.     Heart sounds: Normal heart sounds.  Pulmonary:     Effort: Pulmonary effort is normal. No respiratory distress.     Breath sounds: No wheezing.  Abdominal:     General: Bowel sounds are normal. There is no distension.     Palpations: Abdomen is soft.  Musculoskeletal:        General: Swelling present. No deformity. Normal range of motion.     Cervical back: Normal range of motion and neck supple.     Comments: Bilateral lower extremity edema, right worse than left  Skin:    General: Skin is warm and dry.     Findings: No erythema or rash.  Neurological:     Mental Status: She is alert and oriented to person, place, and time. Mental status is at baseline.      Cranial Nerves: No cranial nerve deficit.     Coordination: Coordination normal.  Psychiatric:        Mood and Affect: Mood normal.       Latest Ref Rng & Units 09/12/2021    6:45 AM  CMP  Glucose 70 - 99 mg/dL 127   BUN 8 - 23 mg/dL 29   Creatinine 0.44 - 1.00 mg/dL 1.18   Sodium 135 - 145 mmol/L 141   Potassium 3.5 - 5.1 mmol/L 3.7   Chloride 98 - 111 mmol/L 104   CO2 22 - 32 mmol/L 29   Calcium 8.9 - 10.3 mg/dL 7.7       Latest Ref Rng & Units 09/12/2021    6:45 AM  CBC  WBC 4.0 - 10.5 K/uL 15.7   Hemoglobin 12.0 - 15.0 g/dL 9.6   Hematocrit 36.0 - 46.0 % 31.1   Platelets 150 - 400 K/uL 312      RADIOGRAPHIC STUDIES: I have personally reviewed the radiological images as listed and agreed with the findings in the report. DG Chest Port 1 View  Result Date: 09/09/2021 CLINICAL DATA:  Shortness of breath. EXAM: PORTABLE CHEST 1 VIEW COMPARISON:  Radiographs and CT yesterday. FINDINGS: Rotated exam. Worsening heterogeneous bilateral airspace disease, right greater than left. Stable heart size and mediastinal contours. No pneumothorax. Pleural effusions on CT are not well-defined, obscured by lower lobe airspace disease. Stable orthopedic hardware. IMPRESSION: Worsening heterogeneous bilateral airspace disease, right greater than left, suspicious for worsening multifocal pneumonia. An element of superimposed pulmonary edema is also considered. Electronically Signed   By: Keith Rake M.D.   On: 09/09/2021 20:17   DG Swallowing Func-Speech Pathology  Result Date: 09/09/2021 Table formatting from the original result was not included. Objective Swallowing Evaluation: Type of Study: MBS-Modified Barium Swallow Study  Patient Details Name: Kerri Carter MRN: [id number] Date of Birth: 01-Apr-1940 Today's Date: 09/09/2021 Time: SLP Start Time (ACUTE ONLY): 1440 -SLP Stop Time (ACUTE ONLY): 1500 SLP Time Calculation (min) (ACUTE ONLY): 20 min Past Medical History: Past Medical History:  Diagnosis Date  Anxiety   Aortic stenosis 07/09/2015  a.) TTE 07/06/2015: EF 55-60%; mild AS with MPG 13.0 mmHg.  Arrhythmia   atrial fibrillation  Asthma   Bipolar disorder ([place])   C. difficile diarrhea 2010  a.) following ABX course during hospital admission  Carotid atherosclerosis, bilateral   a.) Moderate; < 50% stenosis BILATERAL ICAs.  Cataract   a.) s/p BILATERAL extraction  CHF (congestive heart failure) (HCC)   Chicken pox   CKD (chronic kidney disease), stage III (HCC)   a. s/p R nephrectomy./ aneurysm  Conversion disorder   COPD (chronic obstructive pulmonary disease) (HCC)   Depression   Essential hypertension   GERD (gastroesophageal reflux disease)   Heart murmur   Hyperlipidemia   ILD (interstitial lung disease) (HCC)   mild; 2/2 RA diagnosis  Inflammatory arthritis   a. hands/carpal tunnel.  b. Low titer rheumatoid factor. c. Negative anti-CCP antibodies. d. Plaquenil.  Macular degeneration   Nocturnal hypoxemia   Non-Obstructive CAD   a. 07/2009 Cath ([facility]): nonobs dzs;  b. 03/2011 Cath [facility]): nonobs dzs.  Osteoarthritis   a. Knees.  PAD (peripheral artery disease) (HCC)   PUD (peptic ulcer disease)   S/P right hip fracture   11/01/16 s/p repair  Shoulder pain   Sleep apnea   no cpap / minimal  Spinal stenosis at L4-L5 level   severe with L4/L5 anterolisthesis grade 1 anterolisthesis   Toxic maculopathy   Valvular heart disease   a.) TTE 07/06/2015: EF 55-60%; Mild MR/AR/TR; Mild AS with MPG 13.0 mmHg. Past Surgical History: Past Surgical History: Procedure Laterality Date  APPENDECTOMY    APPLICATION OF INTRAOPERATIVE CT SCAN N/A 6/96/7893  Procedure: APPLICATION OF INTRAOPERATIVE CT SCAN;  Surgeon: Meade Maw, MD;  Location: [facility];  Service: Neurosurgery;  Laterality: N/A;  BACK SURGERY    lumbar fusion  BUNIONECTOMY Right   CATARACT EXTRACTION, BILATERAL    CESAREAN SECTION    x1  CHOLECYSTECTOMY N/A 05/11/2016  Procedure: LAPAROSCOPIC CHOLECYSTECTOMY;  Surgeon: Florene Glen, MD;   Location: [facility];  Service: General;  Laterality: N/A;  COLONOSCOPY WITH PROPOFOL N/A 04/02/2016  Procedure: COLONOSCOPY WITH PROPOFOL;  Surgeon: Jonathon Bellows, MD;  Location: [facility] ENDOSCOPY;  Service: Endoscopy;  Laterality: N/A;  ENDOSCOPIC RETROGRADE CHOLANGIOPANCREATOGRAPHY (ERCP) WITH PROPOFOL N/A 05/08/2016  Procedure: ENDOSCOPIC RETROGRADE CHOLANGIOPANCREATOGRAPHY (ERCP) WITH PROPOFOL;  Surgeon: Lucilla Lame, MD;  Location: [facility];  Service: Endoscopy;  Laterality: N/A;  ERCP    with biliary spincterotomy 05/08/16 Dr.  Norris for choledocholithiasis   ESOPHAGEAL DILATION  04/02/2016  Procedure: ESOPHAGEAL DILATION;  Surgeon: Jonathon Bellows, MD;  Location: [facility];  Service: Endoscopy;;  ESOPHAGOGASTRODUODENOSCOPY (EGD) WITH PROPOFOL N/A 04/02/2016  Procedure: ESOPHAGOGASTRODUODENOSCOPY (EGD) WITH PROPOFOL;  Surgeon: Jonathon Bellows, MD;  Location: [facility];  Service: Endoscopy;  Laterality: N/A;  HIP ARTHROPLASTY Right 11/01/2016  Procedure: ARTHROPLASTY BIPOLAR HIP (HEMIARTHROPLASTY);  Surgeon: Corky Mull, MD;  Location: [facility];  Service: Orthopedics;  Laterality: Right;  LUMBAR LAMINECTOMY/DECOMPRESSION MICRODISCECTOMY N/A 12/11/2020  Procedure: LEFT L2-3 MICRODISCECTOMY, L3-4 AND L5-S1 DECOMPRESSION;  Surgeon: Meade Maw, MD;  Location: [facility];  Service: Neurosurgery;  Laterality: N/A;  NEPHRECTOMY  1988  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  ORIF SCAPHOID FRACTURE Left 12/21/2019  Procedure: OPEN REDUCTION INTERNAL FIXATION (ORIF) OF LEFT SCAPULAR NONUNION WITH BONE GRAFT;  Surgeon: Corky Mull, MD;  Location: [facility];  Service: Orthopedics;  Laterality: Left;  osteoporosis    noted DEXA 08/19/16   REPLACEMENT TOTAL KNEE Right   REVERSE SHOULDER ARTHROPLASTY Right 11/04/2017  Procedure: REVERSE SHOULDER ARTHROPLASTY;  Surgeon: Corky Mull, MD;  Location: [facility];  Service: Orthopedics;  Laterality: Right;  REVERSE SHOULDER ARTHROPLASTY Left 07/26/2018  Procedure:  REVERSE SHOULDER ARTHROPLASTY;  Surgeon: Corky Mull, MD;  Location: [facility];  Service: Orthopedics;  Laterality: Left;  TONSILLECTOMY    TOTAL HIP ARTHROPLASTY  12/10/11  [facility] left hip  TOTAL HIP ARTHROPLASTY Bilateral   TRANSFORAMINAL LUMBAR INTERBODY FUSION (TLIF) WITH PEDICLE SCREW FIXATION 3 LEVEL N/A 05/23/2021  Procedure: OPEN L2-4 TRANSFORAMINAL LUMBAR INTERBODY FUSION (TLIF) WITH L2-5 POSTERIOR SPINAL FUSION;  Surgeon: Meade Maw, MD;  Location: [facility];  Service: Neurosurgery;  Laterality: N/A;  TUBAL LIGATION   HPI: Per H&P "THOMASENIA DOWSE is a 81 y.o. female with medical history significant for COPD with chronic respiratory failure on 3 L of oxygen, history of chronic diastolic dysfunction CHF, stage IIIb chronic kidney disease, depression, hypertension, GERD, interstitial lung disease, peripheral arterial disease, bipolar disorder who presents to the ER via EMS for evaluation of worsening shortness of breath.  Patient was recently hospitalized from 07/19 - 07/21 and was discharged home on a steroid taper.  Prior to the hospitalization she was admitted from 06/20 - 06/22 for community-acquired pneumonia.  She states that symptoms started 2 days prior to her presentation and has steadily worsened since then.  Shortness of breath is associated with a nonproductive and wheezing.  She denies having any fever or chills and denies having any sick contacts.  Per EMS she had pulse oximetry of 84% on her 3 L of oxygen and was placed on a CPAP prior to transport to the ER.  She received DuoNeb, albuterol and 125 mg of IV Solu-Medrol.  She denies having any chest pain, no nausea, no vomiting, no abdominal pain, no changes in her bowel habits, no urinary symptoms, no dizziness, no lightheadedness, no leg swelling, no headache, no blurred vision no focal deficit."  Subjective: Kerri Carter alert, pleasant, and cooperative. Denies s/sx oropharyngeal dysphagia. On 3L/min O2 via [place] which is Kerri Carter's baseline supplemental O2  requirement. Cleared with RN who noted Kerri Carter with no observed or reported swallowing difficult.  Recommendations for follow up therapy are one component of a multi-disciplinary discharge planning process, led by the attending physician.  Recommendations may be updated based on patient status, additional functional criteria and insurance authorization. Assessment / Plan / Recommendation   09/09/2021   3:00 PM Clinical Impressions Clinical Impression Kerri Carter presents with adequate oropharyngeal abilities when consuming thin liquids via cup and straw; puree and whole barium tablet with thin liquids via straw. Kerri Carter did become "winded" when consuming 4 consecutive sips of thin liquids via straw. Kerri Carter states understanding of pacing herself when consuming POs. At this time, current diet appears appropriate. No further skilled ST intervention is indicated. SLP Visit Diagnosis Dysphagia, unspecified (R13.10) Impact on safety and function No limitations     09/09/2021   3:00 PM Treatment Recommendations Treatment Recommendations No treatment recommended at this time     09/08/2021   1:53 PM Prognosis Prognosis for Safe Diet Advancement Good Barriers to Reach Goals --   09/09/2021   3:00 PM Diet Recommendations SLP Diet Recommendations Regular solids;Thin liquid Liquid Administration via Straw;Cup Medication Administration Whole meds with liquid Compensations Minimize environmental distractions;Slow rate;Small sips/bites Postural Changes Seated upright at 90 degrees     09/09/2021   3:00 PM Other Recommendations Oral Care Recommendations Oral care BID Follow Up Recommendations No SLP follow up Assistance recommended at discharge None Functional Status Assessment Patient has not had a recent decline in their functional status   09/08/2021   1:53 PM Frequency and Duration  Speech Therapy Frequency (ACUTE ONLY) min 1 x/week Treatment Duration 1 week  09/09/2021   3:00 PM Oral Phase Oral Phase [facility]    09/09/2021   3:00 PM Pharyngeal Phase Pharyngeal Phase  [facility]    09/09/2021   3:00 PM Cervical Esophageal Phase  Cervical Esophageal Phase [facility]FL Happi B. Rutherford Nail, M.S., CCC-SLP, [place] Pathologist Certified Brain Injury [place] Office [phone] [place] [phone] Fax [phone]                      LABORATORY DATA:  I have reviewed the data as listed Lab Results  Component Value Date   WBC 15.7 (H) 09/12/2021   HGB 9.6 (L) 09/12/2021   HCT 31.1 (L) 09/12/2021   MCV 79.3 (L) 09/12/2021   PLT 312 09/12/2021   Recent Labs    08/20/21 0748 08/21/21 0500 09/08/21 0608 09/09/21 0447 09/10/21 0752 09/11/21 0638 09/12/21 0645  NA 141   < > 139   < > 139 141 141  K 3.5   < > 4.3   < > 4.2 3.9 3.7  CL 107   < > 106   < > 101 101 104  CO2 26   < > 24   < > _0 GLUCOSE 135*   < > 159*   < > 208* 201* 127*  BUN 26*   < > 19   < > 31* 35* 29*  CREATININE 1.52*   < > 1.34*   < > 1.44* 1.37* 1.18*  CALCIUM 8.3*   < > 8.4*   < > 7.8* 7.7* 7.7*  GFRNONAA 35*   < > 41*   < > 37* 40* 47*  PROT 6.7  --  6.7  --   --  6.2*  --   ALBUMIN 3.3*  --  3.3*  --   --  3.0*  --   AST 25  --  24  --   --  25  --   ALT 15  --  16  --   --  13  --   ALKPHOS 104  --  98  --   --  80  --   BILITOT 0.4  --  0.6  --   --  0.7  --    < > = values in this interval not displayed.    Iron/TIBC/Ferritin/ %Sat    Component Value Date/Time   IRON 24 (L) 09/11/2021 0638   TIBC 377 09/11/2021 0638   FERRITIN 174 09/11/2021 0638   IRONPCTSAT 6 (L) 09/11/2021 0638   IRONPCTSAT 12 12/28/2016 1645        ASSESSMENT & PLAN:  No diagnosis found.  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 Received steroids inpatient. Saw pcp for follow up last week. Awaiting cardiology eval. Followed by Dr. Patsey Berthold.   - wbc has decreased since hospitalization. No2 12. No fevers, chills. ANC is  normal. Monitor.   # Microcytic anemia- hmg was 12 last Dec, downtrended to 8-9 more recently then 6.9 in hospital. She received 1 unit pRBCs. Concern for GI blood loss in setting of eliquis but no frank bleeding. Awaiting evaluation with GI. Anticoagulation has been continued.   # hemoglobin has improved since discharge. Now 11.3. Microcytosis has resolved. Ferritin is well replenished at 142. No role for IV iron. Continue oral iron. Monitor.   # Leukocytosis- likely secondary to chronic steroid use in setting  of autoimmune disease. Work up has been reassuring. Monitor.   # CKD- stage IIIb. spep was unremarkable. Slightly worse. Recommended f/u with nephrology.   # Frailty- declined SNF. Receiving home health.   # B12 deficiency- start b12 1000 iu daily  No orders of the defined types were placed in this encounter.   All questions were answered. The patient knows to call the clinic with any problems questions or concerns.  Return of visit:  Labs today 81mo lab, Dr YTasia Catchings +/- venofer- la  Thank you for this kind referral and the opportunity to participate in the care of this patient. A copy of today's note is routed to referring provider   LBeckey Rutter [place] [phone](clinic) 10/09/2021

## 2021-10-09 NOTE — Progress Notes (Signed)
Pt in for follow up, very short of breath this morning. Wearing oxygen at 3 liters.

## 2021-10-10 DIAGNOSIS — J441 Chronic obstructive pulmonary disease with (acute) exacerbation: Secondary | ICD-10-CM | POA: Diagnosis not present

## 2021-10-14 DIAGNOSIS — J849 Interstitial pulmonary disease, unspecified: Secondary | ICD-10-CM | POA: Diagnosis not present

## 2021-10-14 DIAGNOSIS — Z79899 Other long term (current) drug therapy: Secondary | ICD-10-CM | POA: Diagnosis not present

## 2021-10-14 DIAGNOSIS — M0579 Rheumatoid arthritis with rheumatoid factor of multiple sites without organ or systems involvement: Secondary | ICD-10-CM | POA: Diagnosis not present

## 2021-10-14 DIAGNOSIS — M9689 Other intraoperative and postprocedural complications and disorders of the musculoskeletal system: Secondary | ICD-10-CM | POA: Diagnosis not present

## 2021-10-15 ENCOUNTER — Telehealth: Payer: Self-pay

## 2021-10-15 ENCOUNTER — Other Ambulatory Visit: Payer: Self-pay | Admitting: Internal Medicine

## 2021-10-15 DIAGNOSIS — Z905 Acquired absence of kidney: Secondary | ICD-10-CM | POA: Diagnosis not present

## 2021-10-15 DIAGNOSIS — N1832 Chronic kidney disease, stage 3b: Secondary | ICD-10-CM | POA: Diagnosis not present

## 2021-10-15 DIAGNOSIS — I129 Hypertensive chronic kidney disease with stage 1 through stage 4 chronic kidney disease, or unspecified chronic kidney disease: Secondary | ICD-10-CM | POA: Diagnosis not present

## 2021-10-15 DIAGNOSIS — N2581 Secondary hyperparathyroidism of renal origin: Secondary | ICD-10-CM | POA: Diagnosis not present

## 2021-10-15 DIAGNOSIS — I10 Essential (primary) hypertension: Secondary | ICD-10-CM | POA: Diagnosis not present

## 2021-10-15 DIAGNOSIS — I480 Paroxysmal atrial fibrillation: Secondary | ICD-10-CM

## 2021-10-15 MED ORDER — APIXABAN 5 MG PO TABS: 5.0000 mg | ORAL_TABLET | Freq: Two times a day (BID) | ORAL | 1 refills | Status: DC

## 2021-10-15 NOTE — Telephone Encounter (Signed)
Patient states she received apixaban (ELIQUIS) 5 MG TABS tablet (Expired) while she was in the hospital and she ran out a couple of days ago.  Patient states she would like for [place] to send a new prescription to her pharmacy - [facility] in [place].  Patient states she would like for [place] to call her when the prescription has been sent.

## 2021-10-15 NOTE — Telephone Encounter (Signed)
Filled but eliquis should be filled by cardiology in the future and they also need to determine if she needs this long term  Discuss with her cardiologist at well

## 2021-10-16 ENCOUNTER — Encounter: Payer: Medicare PPO | Admitting: Neurosurgery

## 2021-10-20 ENCOUNTER — Telehealth: Payer: Self-pay

## 2021-10-20 NOTE — Telephone Encounter (Signed)
Beatrix Fetters called from [facility] to state patient fell on Thursday (10/16/2021) and she fell on her right shoulder.  Beatrix Fetters looked at the anatomy and it didn't look like anything was out of place, but she did recommend patient get it checked out.  Beatrix Fetters states patient states it did hurt and she wouldn't let her touch it.  Beatrix Fetters states patient states she has an appointment with her cardiologist on Wednesday (10/22/2021) and she would request an x-ray then if it was still hurting.  Beatrix Fetters states she did notice patient would move around and not complain about pain, so Beatrix Fetters states patient may be protective of it.

## 2021-10-20 NOTE — Telephone Encounter (Signed)
Pt has ortho Dr. 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

## 2021-10-20 NOTE — Telephone Encounter (Signed)
Spoke to Patient to let her know that she needs to call Dr. Nicholaus Bloom office and schedule an appointment where she fell and hurt her shoulder . Patient stated she is calling to schedule with Dr. Roland Rack.

## 2021-10-20 NOTE — Telephone Encounter (Signed)
Inform patient CHF response

## 2021-10-21 ENCOUNTER — Ambulatory Visit: Payer: Medicare PPO | Admitting: Pulmonary Disease

## 2021-10-21 ENCOUNTER — Encounter: Payer: Self-pay | Admitting: Pulmonary Disease

## 2021-10-21 ENCOUNTER — Other Ambulatory Visit: Payer: Self-pay | Admitting: Gastroenterology

## 2021-10-21 VITALS — BP 124/64 | HR 82 | Temp 97.9°F | Ht 61.0 in | Wt 171.8 lb

## 2021-10-21 DIAGNOSIS — J8489 Other specified interstitial pulmonary diseases: Secondary | ICD-10-CM

## 2021-10-21 DIAGNOSIS — J4489 Other specified chronic obstructive pulmonary disease: Secondary | ICD-10-CM

## 2021-10-21 DIAGNOSIS — J449 Chronic obstructive pulmonary disease, unspecified: Secondary | ICD-10-CM

## 2021-10-21 DIAGNOSIS — M0579 Rheumatoid arthritis with rheumatoid factor of multiple sites without organ or systems involvement: Secondary | ICD-10-CM | POA: Diagnosis not present

## 2021-10-21 DIAGNOSIS — Z23 Encounter for immunization: Secondary | ICD-10-CM

## 2021-10-21 DIAGNOSIS — R1031 Right lower quadrant pain: Secondary | ICD-10-CM

## 2021-10-21 DIAGNOSIS — G4736 Sleep related hypoventilation in conditions classified elsewhere: Secondary | ICD-10-CM | POA: Diagnosis not present

## 2021-10-21 MED ORDER — ALBUTEROL SULFATE (2.5 MG/3ML) 0.083% IN NEBU: 2.5000 mg | INHALATION_SOLUTION | Freq: Four times a day (QID) | RESPIRATORY_TRACT | 6 refills | Status: DC | PRN

## 2021-10-21 MED ORDER — YUPELRI 175 MCG/3ML IN SOLN: 175.0000 ug | Freq: Every day | RESPIRATORY_TRACT | 11 refills | Status: DC

## 2021-10-21 MED ORDER — PREDNISONE 5 MG PO TABS: 5.0000 mg | ORAL_TABLET | Freq: Every day | ORAL | 3 refills | Status: DC

## 2021-10-21 MED ORDER — ARFORMOTEROL TARTRATE 15 MCG/2ML IN NEBU: 15.0000 ug | INHALATION_SOLUTION | Freq: Two times a day (BID) | RESPIRATORY_TRACT | 11 refills | Status: DC

## 2021-10-21 NOTE — Patient Instructions (Addendum)
We have changed your nebulizer medications.  There will be as noted below.  Do not use the DuoNeb (ipratropium/albuterol) this would be a duplicate of what we are giving you that is more long-acting.  I renewed your prednisone it was sent to your pharmacy.  Your as needed albuterol (new albuterol) was also sent to your pharmacy.  The new medications [name] and [name] were sent to [place].  Your new schedule for these medications is as follows:     We will see you in follow-up in 6 to 8 weeks time call sooner should any new problems arise.

## 2021-10-21 NOTE — Progress Notes (Signed)
Subjective:    Patient ID: Kerri Carter, female    DOB: [birth year], 81 y.o.   MRN: [id number] Patient Care Team: Carollee Leitz, MD as PCP - General (Family Medicine) Dannielle Karvonen, RN as [facility] [name], MD as Consulting Physician (Pulmonary Disease)  Chief Complaint  Patient presents with   Follow-up    She had some cough with green/yellow sputum this morning.     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  This is a scheduled visit. She was seen on 7 September in the [facility] emergency room COPD exacerbation.  She did not require admission.  She continues to have issues with dyspnea which are chronic for her but she admits that she is not using her nebulizers as instructed previously.  She was reminded to use her nebulizer medications at the very least 3 times a day.  Has an upcoming appointment with the heart failure clinic coming up. Since her dilation in the emergency room she has not had any fevers, chills or sweats.  Cough has been nonproductive and at baseline.  No hemoptysis.  No chest pain, orthopnea or paroxysmal nocturnal dyspnea.  She is using her oxygen nocturnally at 2 L/min and notes that this helps her.  She does not endorse any other symptomatology.  She did not get to use her nebulizers more than 3 times a day.  Her current medications are DuoNeb and Pulmicort.  She also desires to have flu vaccine today.    Review of Systems A 10 point review of systems was performed and it is as noted above otherwise negative.  Patient Active Problem List   Diagnosis Date Noted   Obesity (BMI 30-39.9) 03/31/2022   NSVT (nonsustained ventricular tachycardia) ([place]) 03/30/2022   Acute urinary retention 03/30/2022   Sepsis due to pneumonia ([place]) 03/28/2022   Demand ischemia 03/28/2022   History of major orthopedic surgery  03/19/2022   Iron deficiency anemia due to chronic blood loss 10/09/2021   Chronic heart failure with preserved ejection fraction ([place]) 09/24/2021   Acute on chronic respiratory failure with hypoxia and hypercapnia (HCC) 09/10/2021   Acute on chronic diastolic CHF (congestive heart failure) (HCC) 08/20/2021   Paroxysmal atrial fibrillation ([place]) 07/29/2021   History of lumbar fusion 05/23/2021   Impaired mobility 12/18/2020   Urinary incontinence 12/18/2020   Lumbar radiculopathy 12/11/2020   Chronic pain of both shoulders 08/29/2019   Hypertriglyceridemia 07/24/2019   Bipolar disorder, in full remission, most recent episode depressed ([place]) 07/13/2019   Essential hypertension 06/16/2019   Chronic pain 06/16/2019   Lymphedema 06/05/2019   Chronic venous insufficiency 05/25/2019   PAD (peripheral artery disease) ([place]) 05/25/2019   Interstitial pulmonary disease ([place]) 07/07/2018   Leukocytosis 10/19/2017   Allergic rhinitis 09/28/2017   Age-related osteoporosis without current pathological fracture 11/05/2016   Peripheral neuropathy 10/28/2016   Osteoporosis 08/27/2016   Drug-induced osteoporosis 08/27/2016   Adnexal mass 08/25/2016   Prediabetes 08/25/2016   Coronary atherosclerosis 08/25/2016   Stage 3b chronic kidney disease ([place])    Hyperlipidemia 07/17/2015   Macular degeneration 07/17/2015   Rheumatoid arthritis ([place]) 12/05/2014   Osteoarthritis of knee 06/07/2013   Valvular heart disease 10/13/2011   COPD with acute exacerbation ([place]) 09/09/2011   OSA (obstructive sleep apnea) 09/09/2011   Gastroesophageal reflux disease 02/24/2011   Single kidney 02/24/2011   Renal artery  stenosis ([place]) 02/24/2011   Social History   Tobacco Use   Smoking status: Former    Packs/day: 0.50    Years: 20.00    Total pack years: 10.00    Types: Cigarettes    Quit date: 02/02/1974    Years since quitting: 48.2   Smokeless tobacco: Never  Substance Use Topics   Alcohol use: No    Allergies  Allergen Reactions   Ceftin [Cefuroxime Axetil] Anaphylaxis   Lisinopril Anaphylaxis   Sulfa Antibiotics Other (See Comments)    Face swelling   Sulfasalazine Anaphylaxis   Morphine Other (See Comments)    Per patient, low blood pressure issues that requires action to raise it back up. Can take small infrequent doses   Xarelto [Rivaroxaban] Other (See Comments)    Stomach burning, bleeding, and tar in stool   Adhesive [Tape] Rash    Paper tape and tega derm OK   Antihistamines, Chlorpheniramine-Type Other (See Comments)    Makes pt hyper   Antivert [Meclizine Hcl] Other (See Comments)    Bladder will not empty   Aspirin Other (See Comments)    Sulfasalazine allergy cross reacts   Contrast Media [Iodinated Contrast Media] Rash    she is able to use betadine scrubs.   Decongestant [Pseudoephedrine Hcl] Other (See Comments)    Makes pt hyper   Doxycycline Other (See Comments)    GI upset   Levaquin [Levofloxacin In D5w] Rash   Polymyxin B Other (See Comments)    Facial rash   Tetanus Toxoids Rash and Other (See Comments)    Fever and hot to touch at injection site   Medications were reviewed and the patient are as noted.  Immunization History  Administered Date(s) Administered   Fluad Quad(high Dose 65+) 10/03/2018, 10/16/2019, 11/22/2020, 10/21/2021   Influenza Split 10/04/2012, 10/25/2013, 10/28/2016   Influenza Whole 11/03/2011   Influenza, High Dose Seasonal PF 10/04/2015, 11/03/2016   Influenza,inj,Quad PF,6+ Mos 10/31/2014   Influenza-Unspecified 01/02/2010, 11/24/2011, 10/21/2013, 10/31/2014, 09/28/2017   Pneumococcal Conjugate-13 10/21/2013   Pneumococcal Polysaccharide-23 11/07/2015   Respiratory Syncytial Virus Vaccine,Recomb Aduvanted(Arexvy) 01/12/2022        Objective:   Physical Exam BP 124/64 (BP Location: Left Arm, Cuff Size: Normal)   Pulse 82   Temp 97.9 F (36.6 C) (Oral)   Ht '5\' 1"'$  (1.549 m)   Wt 171 lb 12.8 oz (77.9 kg)   SpO2  94% Comment: on RA  BMI 32.46 kg/m   SpO2: 94 % (on RA) O2 Device: None (Room air)  GENERAL: Well-developed, well-nourished elderly woman, she presents on transport chair today.   HEAD: Normocephalic, atraumatic. EYES: Pupils equal, round, reactive to light.  No scleral icterus. MOUTH: Oral mucosa moist.  No thrush. NECK: Supple. No thyromegaly. Trachea midline. No JVD.  No adenopathy. PULMONARY: Coarse breath sounds throughout, no wheezes noted.  CARDIOVASCULAR: S1 and S2. Regular rate and rhythm.  Grade 99991111 systolic ejection murmur left sternal border, seem more prominent. GASTROINTESTINAL: Benign MUSCULOSKELETAL: Mild changes of RA both hands, no clubbing, no edema.  Limited range of motion of joints due to RA. NEUROLOGIC: No overt focal deficits.  Awake, alert, speech is fluent. SKIN: Intact,warm,dry. PSYCH: Mood and behavior appropriate       Assessment & Plan:     ICD-10-CM   1. Asthma-COPD overlap syndrome ([place])  J44.9    Perceives her cough and symptoms are worse Trial of Brovana/Pulmicort/Yupelri Albuterol as needed Cannot use MDI/DPI's effectively due to RA    2.  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 NSIP (nonspecific interstitial pneumonitis) (HCC)  J84.89    Associated with rheumatoid arthritis    4. Rheumatoid arthritis involving multiple sites with positive rheumatoid factor ([place])  M05.79    This issue adds complexity to her management On Arav[place]    5. Need for immunization against influenza  Z23 Flu Vaccine QUAD High Dose(Fluad)   Received flu vaccine today     Orders Placed This Encounter  Procedures   Flu Vaccine QUAD High Dose(Fluad)   Pulse oximetry, overnight    Standing Status:   Future    Standing Expiration Date:   10/22/2022    Scheduling Instructions:     ONO on RA- [employer]   Meds ordered this encounter  Medications   predniSONE  (DELTASONE) 5 MG tablet    Sig: Take 1 tablet (5 mg total) by mouth daily with breakfast.    Dispense:  30 tablet    Refill:  3   arformoterol (BROVANA) 15 MCG/2ML NEBU    Sig: Take 2 mLs (15 mcg total) by nebulization 2 (two) times daily.    Dispense:  120 mL    Refill:  11   revefenacin (YUPELRI) 175 MCG/3ML nebulizer solution    Sig: Take 3 mLs (175 mcg total) by nebulization daily.    Dispense:  90 mL    Refill:  11   albuterol (PROVENTIL) (2.5 MG/3ML) 0.083% nebulizer solution    Sig: Take 3 mLs (2.5 mg total) by nebulization every 6 (six) hours as needed for wheezing or shortness of breath.    Dispense:  75 mL    Refill:  6   We will try to optimize her nebulizer therapy.  She was given very specific instructions and a chart of how to take her medications.  She received flu vaccine today.  Will see her in follow-up in 6 to 8 weeks time she is to contact us prior to that time should any new difficulties arise.

## 2021-10-21 NOTE — Progress Notes (Unsigned)
Patient ID: Kerri Carter, female    DOB: 05/19/1940, 81 y.o.   MRN: [id number]  HPI  Kerri Carter is a 81 y/o female with a history of asthma, PAF, carotid disease, CAD, hyperlipidemia, HTN, CKD, anxiety, aortic stenosis, bipolar, conversion disorder, depression, GERD, ILD, macular degeneration, PAD, previous tobacco use and chronic heart failure.   Echo report from 07/29/21 reviewed and showed an EF of 55-60% along with moderate/severe MR but no LVH.   Was in the ED 10/09/21 due to COPD exacerbation where she was treated and released. Admitted 09/08/21 due to shortness of breath. Initially placed on bipap but then weaned down to 5L. Completed antibiotics. Given 1 unit of blood due to anemia.    Podiatry consult obtained. Discharged after 4 days. Admitted 08/20/21 due to worsening shortness of breath. Treated with antibiotics, IV steroids and nebulizers with improvement of symptoms. Discharged after 2 days. Admitted 07/28/21 due to shortness of breath. Found to be hypoxic and given nebulizer treatment and placed on bipap. Weaned off bipap to her oxygen at 2L. Cardiology consult obtained. Initially given IV lasix with transition to oral diuretic. PT evaluation done. Finishing prednisone taper. Discharged after 3 days. Admitted 07/22/21 due to shortness of breath and increased cough due to COPD exacerbation & pneumonia. Given antibiotics and prednisone taper. PT evaluation done. Discharged after 2 days.   She presents today for a follow-up visit with a chief complaint of moderate shortness of breath with minimal exertion. Describes this as chronic in nature. She has associated fatigue, cough, pedal edema, easy bruising and right shoulder pain after a recent fall. She denies any difficulty sleeping, abdominal distention, palpitations, chest pain, wheezing, dizziness or weight gain.   Had a recent fall and hurt her right shoulder. Has upcoming appointment with orthopedics ([facility]) in 9 days.   Not adding salt but  has recently eaten cheetos and said they tasted very salty to her.   Past Medical History:  Diagnosis Date   Anxiety    Aortic stenosis 07/09/2015   a.) TTE 07/06/2015: EF 55-60%; mild AS with MPG 13.0 mmHg.   Arrhythmia    atrial fibrillation   Asthma    Bipolar disorder ([place])    C. difficile diarrhea 2010   a.) following ABX course during hospital admission   Carotid atherosclerosis, bilateral    a.) Moderate; < 50% stenosis BILATERAL ICAs.   Cataract    a.) s/p BILATERAL extraction   CHF (congestive heart failure) (HCC)    Chicken pox    CKD (chronic kidney disease), stage III (HCC)    a. s/p R nephrectomy./ aneurysm   Conversion disorder    COPD (chronic obstructive pulmonary disease) (HCC)    Depression    Essential hypertension    GERD (gastroesophageal reflux disease)    Heart murmur    Hyperlipidemia    ILD (interstitial lung disease) (HCC)    mild; 2/2 RA diagnosis   Inflammatory arthritis    a. hands/carpal tunnel.  b. Low titer rheumatoid factor. c. Negative anti-CCP antibodies. d. Plaquenil.   Macular degeneration    Nocturnal hypoxemia    Non-Obstructive CAD    a. 07/2009 Cath ([facility]): nonobs dzs;  b. 03/2011 Cath [facility]): nonobs dzs.   Osteoarthritis    a. Knees.   PAD (peripheral artery disease) (HCC)    PUD (peptic ulcer disease)    S/P right hip fracture    11/01/16 s/p repair   Shoulder pain    Sleep apnea  no cpap / minimal   Spinal stenosis at L4-L5 level    severe with L4/L5 anterolisthesis grade 1 anterolisthesis    Toxic maculopathy    Valvular heart disease    a.) TTE 07/06/2015: EF 55-60%; Mild MR/AR/TR; Mild AS with MPG 13.0 mmHg.   Past Surgical History:  Procedure Laterality Date   APPENDECTOMY     APPLICATION OF INTRAOPERATIVE CT SCAN N/A 05/23/2021   Procedure: APPLICATION OF INTRAOPERATIVE CT SCAN;  Surgeon: Meade Maw, MD;  Location: [facility];  Service: Neurosurgery;  Laterality: N/A;   BACK SURGERY     lumbar fusion    BUNIONECTOMY Right    CATARACT EXTRACTION, BILATERAL     CESAREAN SECTION     x1   CHOLECYSTECTOMY N/A 05/11/2016   Procedure: LAPAROSCOPIC CHOLECYSTECTOMY;  Surgeon: Florene Glen, MD;  Location: [facility];  Service: General;  Laterality: N/A;   COLONOSCOPY WITH PROPOFOL N/A 04/02/2016   Procedure: COLONOSCOPY WITH PROPOFOL;  Surgeon: Jonathon Bellows, MD;  Location: [facility];  Service: Endoscopy;  Laterality: N/A;   ENDOSCOPIC RETROGRADE CHOLANGIOPANCREATOGRAPHY (ERCP) WITH PROPOFOL N/A 05/08/2016   Procedure: ENDOSCOPIC RETROGRADE CHOLANGIOPANCREATOGRAPHY (ERCP) WITH PROPOFOL;  Surgeon: Lucilla Lame, MD;  Location: [facility];  Service: Endoscopy;  Laterality: N/A;   ERCP     with biliary spincterotomy 05/08/16 Dr. Allen Norris for choledocholithiasis    ESOPHAGEAL DILATION  04/02/2016   Procedure: ESOPHAGEAL DILATION;  Surgeon: Jonathon Bellows, MD;  Location: [facility];  Service: Endoscopy;;   ESOPHAGOGASTRODUODENOSCOPY (EGD) WITH PROPOFOL N/A 04/02/2016   Procedure: ESOPHAGOGASTRODUODENOSCOPY (EGD) WITH PROPOFOL;  Surgeon: Jonathon Bellows, MD;  Location: [facility];  Service: Endoscopy;  Laterality: N/A;   HIP ARTHROPLASTY Right 11/01/2016   Procedure: ARTHROPLASTY BIPOLAR HIP (HEMIARTHROPLASTY);  Surgeon: Corky Mull, MD;  Location: [facility];  Service: Orthopedics;  Laterality: Right;   LUMBAR LAMINECTOMY/DECOMPRESSION MICRODISCECTOMY N/A 12/11/2020   Procedure: LEFT L2-3 MICRODISCECTOMY, L3-4 AND L5-S1 DECOMPRESSION;  Surgeon: Meade Maw, MD;  Location: [facility];  Service: Neurosurgery;  Laterality: N/A;   NEPHRECTOMY  1988   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   ORIF SCAPHOID FRACTURE Left 12/21/2019   Procedure: OPEN REDUCTION INTERNAL FIXATION (ORIF) OF LEFT SCAPULAR NONUNION WITH BONE GRAFT;  Surgeon: Corky Mull, MD;  Location: [facility];  Service: Orthopedics;  Laterality: Left;   osteoporosis     noted DEXA 08/19/16    REPLACEMENT TOTAL KNEE Right     REVERSE SHOULDER ARTHROPLASTY Right 11/04/2017   Procedure: REVERSE SHOULDER ARTHROPLASTY;  Surgeon: Corky Mull, MD;  Location: [facility];  Service: Orthopedics;  Laterality: Right;   REVERSE SHOULDER ARTHROPLASTY Left 07/26/2018   Procedure: REVERSE SHOULDER ARTHROPLASTY;  Surgeon: Corky Mull, MD;  Location: [facility];  Service: Orthopedics;  Laterality: Left;   TONSILLECTOMY     TOTAL HIP ARTHROPLASTY  12/10/11   [facility] left hip   TOTAL HIP ARTHROPLASTY Bilateral    TRANSFORAMINAL LUMBAR INTERBODY FUSION (TLIF) WITH PEDICLE SCREW FIXATION 3 LEVEL N/A 05/23/2021   Procedure: OPEN L2-4 TRANSFORAMINAL LUMBAR INTERBODY FUSION (TLIF) WITH L2-5 POSTERIOR SPINAL FUSION;  Surgeon: Meade Maw, MD;  Location: [facility];  Service: Neurosurgery;  Laterality: N/A;   TUBAL LIGATION     Family History  Problem Relation Age of Onset   Rheum arthritis Mother    Asthma Mother    Parkinson's disease Mother    Heart disease Mother    Stroke Mother    Hypertension Mother    Heart attack Father  Heart disease Father    Hypertension Father    Peripheral Artery Disease Father    Diabetes Son    Gout Son    Asthma Sister    Heart disease Sister    Lung cancer Sister    Heart disease Sister    Heart disease Sister    Breast cancer Sister    Heart attack Sister    Heart disease Brother    Heart disease Maternal Grandmother    Diabetes Maternal Grandmother    Colon cancer Maternal Grandmother    Cancer Maternal Grandmother        Hodgkins lymphoma   Heart disease Brother    Alcohol abuse Brother    Depression Brother    Dementia Son    Social History   Tobacco Use   Smoking status: Former    Packs/day: 0.50    Years: 20.00    Total pack years: 10.00    Types: Cigarettes    Quit date: 02/02/1974    Years since quitting: 47.7   Smokeless tobacco: Never  Substance Use Topics   Alcohol use: No   Allergies  Allergen Reactions   Ceftin [Cefuroxime Axetil] Anaphylaxis   Lisinopril  Anaphylaxis   Sulfa Antibiotics Other (See Comments)    Face swelling   Sulfasalazine Anaphylaxis   Morphine Other (See Comments)    Per patient, low blood pressure issues that requires action to raise it back up. Can take small infrequent doses   Xarelto [Rivaroxaban] Other (See Comments)    Stomach burning, bleeding, and tar in stool   Adhesive [Tape] Rash    Paper tape and tega derm OK   Antihistamines, Chlorpheniramine-Type Other (See Comments)    Makes pt hyper   Antivert [Meclizine Hcl] Other (See Comments)    Bladder will not empty   Aspirin Other (See Comments)    Sulfasalazine allergy cross reacts   Contrast Media [Iodinated Contrast Media] Rash    she is able to use betadine scrubs.   Decongestant [Pseudoephedrine Hcl] Other (See Comments)    Makes pt hyper   Doxycycline Other (See Comments)    GI upset   Levaquin [Levofloxacin In D5w] Rash   Polymyxin B Other (See Comments)    Facial rash   Tetanus Toxoids Rash and Other (See Comments)    Fever and hot to touch at injection site   Prior to Admission medications   Medication Sig Start Date End Date Taking? Authorizing Provider  albuterol (PROVENTIL) (2.5 MG/3ML) 0.083% nebulizer solution Take 3 mLs (2.5 mg total) by nebulization every 6 (six) hours as needed for wheezing or shortness of breath. 10/21/21  Yes [name], MD  albuterol (VENTOLIN HFA) 108 (90 Base) MCG/ACT inhaler Inhale 2 puffs into the lungs every 6 (six) hours as needed for wheezing or shortness of breath. 06/24/21  Yes McLean-Scocuzza, Nino Glow, MD  amLODipine (NORVASC) 5 MG tablet Take 1 tablet (5 mg total) by mouth daily. 08/01/21  Yes McLean-Scocuzza, Nino Glow, MD  apixaban (ELIQUIS) 5 MG TABS tablet Take 1 tablet (5 mg total) by mouth 2 (two) times daily. 10/15/21  Yes McLean-Scocuzza, Nino Glow, MD  arformoterol (BROVANA) 15 MCG/2ML NEBU Take 2 mLs (15 mcg total) by nebulization 2 (two) times daily. 10/21/21  Yes [name], MD  budesonide  (PULMICORT) 0.25 MG/2ML nebulizer solution USE 1 VIAL  IN  NEBULIZER TWICE  DAILY - rinse mouth after treatment 08/13/21  Yes [name], MD  busPIRone (BUSPAR) 5 MG  tablet Take 1 tablet (5 mg total) by mouth 2 (two) times daily. 06/02/21  Yes Jennye Boroughs, MD  Cholecalciferol (VITAMIN D3) 50 MCG (2000 UT) TABS Take 4,000 Units by mouth daily. 08/01/21  Yes McLean-Scocuzza, Nino Glow, MD  dicyclomine (BENTYL) 10 MG capsule Take 1 capsule (10 mg total) by mouth 2 (two) times daily as needed for spasms. Patient taking differently: Take 20 mg by mouth 2 (two) times daily. 10/01/20  Yes Jonathon Bellows, MD  escitalopram (LEXAPRO) 10 MG tablet Take 1 tablet (10 mg total) by mouth daily. 08/01/21  Yes McLean-Scocuzza, Nino Glow, MD  etodolac (LODINE) 500 MG tablet Take 1 tablet by mouth daily. 09/08/21  Yes [provider]  famotidine (PEPCID) 20 MG tablet TAKE 1 TABLET (20 MG TOTAL) BY MOUTH DAILY. BEFORE BREAKFAST OR DINNER 03/24/21  Yes McLean-Scocuzza, Nino Glow, MD  ferrous sulfate 325 (65 FE) MG EC tablet Take 1 tablet (325 mg total) by mouth 2 (two) times daily. Patient taking differently: Take 325 mg by mouth 2 (two) times daily. Pt reports MD instructed her to take twice a day every other day. 09/12/21 09/12/22 Yes Danford, Suann Larry, MD  fluticasone (FLONASE) 50 MCG/ACT nasal spray Place 2 sprays into both nostrils daily. In am 12/08/19  Yes McLean-Scocuzza, Nino Glow, MD  gabapentin (NEURONTIN) 300 MG capsule TAKE 2 CAPSULES (600 MG TOTAL) BY MOUTH IN THE MORNING AND AT BEDTIME. 09/08/21  Yes McLean-Scocuzza, Nino Glow, MD  lamoTRIgine (LAMICTAL) 100 MG tablet TAKE 1 TAB 2 TIMES DAILY. FURTHER REFILLS NEW PSYCH FOR ALL PSYCH MEDS ONLY TEMP SUPPLY FROM PCP 05/02/21  Yes McLean-Scocuzza, Nino Glow, MD  leflunomide (ARAVA) 20 MG tablet Take 1 tablet (20 mg total) by mouth daily. 09/30/17  Yes McLean-Scocuzza, Nino Glow, MD  lidocaine (LIDODERM) 5 % Place 1 patch onto the skin 2 (two) times daily as needed.  Remove & Discard patch within 12 hours or as directed by MD Patient taking differently: Place 1 patch onto the skin daily. Remove & Discard patch within 12 hours or as directed by MD 11/22/20  Yes McLean-Scocuzza, Nino Glow, MD  lovastatin (MEVACOR) 20 MG tablet TAKE 1 TABLET BY MOUTH EVERYDAY AT BEDTIME *STOP TALKING '40MG'$ * Patient taking differently: Take 20 mg by mouth at bedtime. 08/01/21  Yes McLean-Scocuzza, Nino Glow, MD  methocarbamol (ROBAXIN) 500 MG tablet Take 500 mg by mouth 4 (four) times daily as needed. Taking twice a day currently 01/23/21  Yes [provider]  mirabegron ER (MYRBETRIQ) 50 MG TB24 tablet Take 1 tablet (50 mg total) by mouth daily. 12/04/20 11/29/21 Yes Vaillancourt, Samantha, PA-C  montelukast (SINGULAIR) 10 MG tablet TAKE 1 TABLET BY MOUTH EVERY DAY Patient taking differently: Take 10 mg by mouth daily. 03/24/21  Yes McLean-Scocuzza, Nino Glow, MD  multivitamin-lutein [facility]) CAPS capsule Take 1 capsule by mouth at bedtime.   Yes [provider]  nebivolol (BYSTOLIC) 5 MG tablet Take 1 tablet (5 mg total) by mouth in the morning and at bedtime. (Note dose changed from 1/2 10 mg bid to 5 mg bid) 08/01/21  Yes McLean-Scocuzza, Nino Glow, MD  OXYGEN Inhale 2 L into the lungs at bedtime.   Yes [provider]  pantoprazole (PROTONIX) 40 MG tablet TAKE 1 TABLET BY MOUTH 2 TIMES DAILY 30 MIN BEFORE FOOD (NOTE REDUCTION IN FREQUENCY) 07/07/21  Yes McLean-Scocuzza, Nino Glow, MD  predniSONE (DELTASONE) 5 MG tablet Take 1 tablet (5 mg total) by mouth daily with breakfast. 10/21/21  Yes Patsey Berthold,  Lolita Cram, MD  QUEtiapine (SEROQUEL) 25 MG tablet TAKE 1 TABLET (25 MG TOTAL) BY MOUTH AT BEDTIME. AGAIN LAST FILL FURTHER REFILLS FROM PSYCHIATRY NO EXCEPTIONS 09/13/20  Yes McLean-Scocuzza, Nino Glow, MD  revefenacin (YUPELRI) 175 MCG/3ML nebulizer solution Take 3 mLs (175 mcg total) by nebulization daily. 10/21/21  Yes [name], MD  sucralfate (CARAFATE) 1 g  tablet Take 1 tablet (1 g total) by mouth 2 (two) times daily. 09/17/20 10/22/21 Yes Jonathon Bellows, MD  torsemide (DEMADEX) 20 MG tablet Take 1 tablet (20 mg total) by mouth daily. 09/12/21  Yes Danford, Suann Larry, MD  zoledronic acid (RECLAST) 5 MG/100ML SOLN injection Inject 5 mg into the vein once.   Yes [provider]    Review of Systems  Constitutional:  Positive for fatigue (easily). Negative for appetite change.  HENT:  Negative for congestion, postnasal drip and sore throat.   Eyes: Negative.   Respiratory:  Positive for cough (productive at times) and shortness of breath (easily). Negative for chest tightness and wheezing.   Cardiovascular:  Positive for leg swelling. Negative for chest pain and palpitations.  Gastrointestinal:  Negative for abdominal distention and abdominal pain.  Endocrine: Negative.   Genitourinary: Negative.   Musculoskeletal:  Positive for arthralgias (right shoulder). Negative for back pain and neck pain.  Skin: Negative.   Allergic/Immunologic: Negative.   Neurological:  Negative for dizziness and light-headedness.  Hematological:  Negative for adenopathy. Bruises/bleeds easily.  Psychiatric/Behavioral:  Negative for dysphoric mood and sleep disturbance (sleeping on 1 pillow with HOB elevated along with oxygen). The patient is not nervous/anxious.    Vitals:   10/22/21 1454  BP: (!) 149/50  Pulse: 74  Resp: 16  SpO2: 97%  Weight: 172 lb 8 oz (78.2 kg)  Height: '5\' 1"'$  (1.549 m)   Wt Readings from Last 3 Encounters:  10/22/21 172 lb 8 oz (78.2 kg)  10/21/21 171 lb 12.8 oz (77.9 kg)  10/09/21 173 lb 14.4 oz (78.9 kg)   Lab Results  Component Value Date   CREATININE 1.68 (H) 10/09/2021   CREATININE 1.18 (H) 09/12/2021   CREATININE 1.37 (H) 09/11/2021   Physical Exam Vitals and nursing note reviewed.  Constitutional:      Appearance: She is well-developed.  HENT:     Head: Normocephalic and atraumatic.  Neck:     Vascular: No JVD.   Cardiovascular:     Rate and Rhythm: Normal rate and regular rhythm.  Pulmonary:     Effort: Pulmonary effort is normal. No accessory muscle usage.     Breath sounds: No wheezing, rhonchi or rales.  Abdominal:     Palpations: Abdomen is soft.     Tenderness: There is no abdominal tenderness.  Musculoskeletal:     Cervical back: Normal range of motion and neck supple.     Right lower leg: No tenderness. Edema (1+ pitting) present.     Left lower leg: No tenderness. Edema (trace pitting) present.  Skin:    General: Skin is warm and dry.  Neurological:     General: No focal deficit present.     Mental Status: She is alert and oriented to person, place, and time.  Psychiatric:        Mood and Affect: Mood normal.        Behavior: Behavior normal.    Assessment & Plan:  1: Chronic heart failure with preserved ejection fraction without structural changes- - NYHA class III - euvolemic today - not weighing daily  because she has fallen getting on scales; encouraged her to straddle the scale with her walker or put double sided tape on the legs of the scale; instructed her to call for an overnight weight gain of > 2 pounds or a weekly weight gain of > 5 pounds - weight down 3 pounds from last visit here 6 weeks ago - not adding salt and reviewed the importance of keeping daily sodium intake to '2000mg'$ ; did recently eat cheetos and commented on how salty they were; encouraged her to not eat anymore cheetos - drinking ~ 60-64 ounces of fluid daily - saw cardiology ([facility]) 05/16/21 - to get compression socks and try to wear them daily with putting them on every morning and taking them off every evening; elevate legs when sitting for long periods of time - BNP 10/09/21 was 104.05 - reports getting her flu vaccine for this season  2: HTN with CKD- - BP mildly elevated (149/50) - saw nephrology Candiss Norse) 10/15/21 - BMP 10/09/21 reviewed and showed sodium 140, potassium 3.8, creatinine 1.33 & GFR  41  3: COPD- - saw pulmonology Patsey Berthold) 10/21/21 - wearing oxygen at 3L around the clock  4: Anxiety/ depression- - reports feeling this is stable at this time - saw PCP (McLean-Scocuzza) 09/24/21  5: Right shoulder pain- - fell recently - has upcoming orthopaedic appointment with [facility] on 10/31/21   Patient did not bring her medications nor a list. Each medication was verbally reviewed with the patient and she was encouraged to bring the bottles to every visit to confirm accuracy of list.   Return in 6 months, sooner if needed.

## 2021-10-22 ENCOUNTER — Encounter: Payer: Self-pay | Admitting: Family

## 2021-10-22 ENCOUNTER — Ambulatory Visit: Payer: Medicare PPO | Attending: Family | Admitting: Family

## 2021-10-22 ENCOUNTER — Other Ambulatory Visit: Payer: Self-pay

## 2021-10-22 VITALS — BP 149/50 | HR 74 | Resp 16 | Ht 61.0 in | Wt 172.5 lb

## 2021-10-22 DIAGNOSIS — F419 Anxiety disorder, unspecified: Secondary | ICD-10-CM | POA: Diagnosis not present

## 2021-10-22 DIAGNOSIS — I350 Nonrheumatic aortic (valve) stenosis: Secondary | ICD-10-CM | POA: Diagnosis not present

## 2021-10-22 DIAGNOSIS — Z981 Arthrodesis status: Secondary | ICD-10-CM

## 2021-10-22 DIAGNOSIS — I5032 Chronic diastolic (congestive) heart failure: Secondary | ICD-10-CM

## 2021-10-22 DIAGNOSIS — N183 Chronic kidney disease, stage 3 unspecified: Secondary | ICD-10-CM | POA: Insufficient documentation

## 2021-10-22 DIAGNOSIS — F32A Depression, unspecified: Secondary | ICD-10-CM | POA: Diagnosis not present

## 2021-10-22 DIAGNOSIS — Z87891 Personal history of nicotine dependence: Secondary | ICD-10-CM | POA: Insufficient documentation

## 2021-10-22 DIAGNOSIS — R0602 Shortness of breath: Secondary | ICD-10-CM | POA: Insufficient documentation

## 2021-10-22 DIAGNOSIS — E785 Hyperlipidemia, unspecified: Secondary | ICD-10-CM | POA: Insufficient documentation

## 2021-10-22 DIAGNOSIS — J849 Interstitial pulmonary disease, unspecified: Secondary | ICD-10-CM | POA: Diagnosis not present

## 2021-10-22 DIAGNOSIS — K219 Gastro-esophageal reflux disease without esophagitis: Secondary | ICD-10-CM | POA: Diagnosis not present

## 2021-10-22 DIAGNOSIS — G8929 Other chronic pain: Secondary | ICD-10-CM | POA: Diagnosis not present

## 2021-10-22 DIAGNOSIS — D649 Anemia, unspecified: Secondary | ICD-10-CM | POA: Diagnosis not present

## 2021-10-22 DIAGNOSIS — J440 Chronic obstructive pulmonary disease with acute lower respiratory infection: Secondary | ICD-10-CM | POA: Insufficient documentation

## 2021-10-22 DIAGNOSIS — M5416 Radiculopathy, lumbar region: Secondary | ICD-10-CM

## 2021-10-22 DIAGNOSIS — I10 Essential (primary) hypertension: Secondary | ICD-10-CM

## 2021-10-22 DIAGNOSIS — M25511 Pain in right shoulder: Secondary | ICD-10-CM

## 2021-10-22 DIAGNOSIS — I130 Hypertensive heart and chronic kidney disease with heart failure and stage 1 through stage 4 chronic kidney disease, or unspecified chronic kidney disease: Secondary | ICD-10-CM | POA: Diagnosis not present

## 2021-10-22 DIAGNOSIS — J449 Chronic obstructive pulmonary disease, unspecified: Secondary | ICD-10-CM | POA: Diagnosis not present

## 2021-10-22 DIAGNOSIS — I480 Paroxysmal atrial fibrillation: Secondary | ICD-10-CM | POA: Insufficient documentation

## 2021-10-22 DIAGNOSIS — I2510 Atherosclerotic heart disease of native coronary artery without angina pectoris: Secondary | ICD-10-CM | POA: Diagnosis not present

## 2021-10-22 NOTE — Patient Instructions (Addendum)
Continue weighing daily and call for an overnight weight gain of 3 pounds or more or a weekly weight gain of more than 5 pounds. ? ? ?If you have voicemail, please make sure your mailbox is cleaned out so that we may leave a message and please make sure to listen to any voicemails.  ? ? ?Get compression socks and put them on every morning with removal at bedtime ?

## 2021-10-23 ENCOUNTER — Ambulatory Visit: Payer: Medicare PPO | Admitting: Neurosurgery

## 2021-10-23 ENCOUNTER — Telehealth: Payer: Self-pay

## 2021-10-23 ENCOUNTER — Encounter: Payer: Self-pay | Admitting: Neurosurgery

## 2021-10-23 ENCOUNTER — Ambulatory Visit
Admission: RE | Admit: 2021-10-23 | Discharge: 2021-10-23 | Disposition: A | Discharge status: Home / Self Care | Payer: Medicare PPO | Source: Ambulatory Visit | Attending: Internal Medicine | Admitting: Internal Medicine

## 2021-10-23 ENCOUNTER — Ambulatory Visit
Admission: RE | Admit: 2021-10-23 | Discharge: 2021-10-23 | Disposition: A | Discharge status: Home / Self Care | Payer: Medicare PPO | Source: Ambulatory Visit | Attending: Neurosurgery | Admitting: Neurosurgery

## 2021-10-23 VITALS — BP 124/74 | Ht 61.0 in | Wt 172.0 lb

## 2021-10-23 DIAGNOSIS — Z981 Arthrodesis status: Secondary | ICD-10-CM

## 2021-10-23 DIAGNOSIS — Z09 Encounter for follow-up examination after completed treatment for conditions other than malignant neoplasm: Secondary | ICD-10-CM

## 2021-10-23 DIAGNOSIS — M5416 Radiculopathy, lumbar region: Secondary | ICD-10-CM | POA: Insufficient documentation

## 2021-10-23 DIAGNOSIS — M4316 Spondylolisthesis, lumbar region: Secondary | ICD-10-CM

## 2021-10-23 DIAGNOSIS — J441 Chronic obstructive pulmonary disease with (acute) exacerbation: Secondary | ICD-10-CM | POA: Diagnosis not present

## 2021-10-23 DIAGNOSIS — M5136 Other intervertebral disc degeneration, lumbar region: Secondary | ICD-10-CM

## 2021-10-23 NOTE — Progress Notes (Signed)
   DOS: May 23, 2021 (L2-4 transforaminal lumbar interbody fusion with L2-5 fixation)  HISTORY OF PRESENT ILLNESS: 10/23/2021 Ms. Ernisha Sorn is status post above surgery.  She is doing very well from her back standpoint.  Her symptoms are much improved.  She has had multiple different hospitalizations for pulmonary related issues.  She is now on 3 L of oxygen.  This is helped her significantly.   PHYSICAL EXAMINATION:   Vitals:   10/23/21 1129  BP: 124/74   General: Patient is well developed, well nourished, calm, collected, and in no apparent distress.  NEUROLOGICAL:  General: In no acute distress.  Awake, alert, oriented to person, place, and time. Pupils equal round and reactive to light.   Strength:  Side Iliopsoas Quads Hamstring PF DF EHL  R '5 5 5 5 5 5  '$ L '5 5 5 5 5 5   '$ Incision c/d/i   ROS (Neurologic): Negative except as noted above  IMAGING: No complications noted  ASSESSMENT/PLAN:  Jacey Eckerson is doing well after extension of her lumbar fusion.  She has had multiple medical events since her surgery.  I will see her back in 6 months.  She has no activity limitations.  I spent a total of 15 minutes in face-to-face and non-face-to-face activities related to this patient's care today.   Meade Maw MD, [facility] Department of Neurosurgery

## 2021-10-23 NOTE — Telephone Encounter (Signed)
Patient called stating that she has had abdominal pain and change in bowel habits for months and her PCP told her to call us to schedule an appointment with Dr. Vicente Males. I was able to schedule an appointment for her on 12/09/2021 at 1:45 PM with Dr. Vicente Males. Patient was glad to hear.

## 2021-10-27 ENCOUNTER — Telehealth: Payer: Self-pay | Admitting: Pulmonary Disease

## 2021-10-27 MED ORDER — BUDESONIDE 0.25 MG/2ML IN SUSP: RESPIRATORY_TRACT | 5 refills | Status: DC

## 2021-10-27 MED ORDER — IPRATROPIUM-ALBUTEROL 0.5-2.5 (3) MG/3ML IN SOLN: 3.0000 mL | Freq: Four times a day (QID) | RESPIRATORY_TRACT | 5 refills | Status: DC | PRN

## 2021-10-27 NOTE — Telephone Encounter (Signed)
She can then continue DuoNeb and Pulmicort.

## 2021-10-27 NOTE — Telephone Encounter (Signed)
Kerri Carter with [facility] wanted to make Dr. Patsey Berthold aware that patient declined [name] and [name] Rx due to cost. Patient stated that she would contact manufactory. Routing to Dr. Patsey Berthold to make aware.

## 2021-10-27 NOTE — Addendum Note (Signed)
Addended by: Claudette Head A on: 10/27/2021 12:00 PM   Modules accepted: Orders

## 2021-10-27 NOTE — Addendum Note (Signed)
Addended by: Claudette Head A on: 10/27/2021 02:58 PM   Modules accepted: Orders

## 2021-10-27 NOTE — Telephone Encounter (Signed)
DuoNeb 4 times a day and Pulmicort 0.25 mg twice a day after DuoNeb.

## 2021-10-27 NOTE — Telephone Encounter (Signed)
Rx has been sent to preferred pharmacy.  Patient is aware and voiced her understanding.  Nothing further needed.

## 2021-10-27 NOTE — Telephone Encounter (Addendum)
Patient is aware of below message and voiced her understanding.  She would like Pulmicort and duoneb sent to [facility].  Dr. Patsey Berthold, please advise on Duoneb Rx? Is it QID? Albuterol solution was sent in 9/19/203

## 2021-10-28 DIAGNOSIS — J449 Chronic obstructive pulmonary disease, unspecified: Secondary | ICD-10-CM | POA: Diagnosis not present

## 2021-10-28 IMAGING — CR DG CHEST 2V
1 series · 2 of 2 positions shown · non-contrast
Comparison: 10/08/2020

CLINICAL DATA: Shortness of breath

EXAM:
CHEST - 2 VIEW

[Series 1: dg chest 2 view · 0.14mm/px · 2 of 2 slices shown]
[im 1/2]
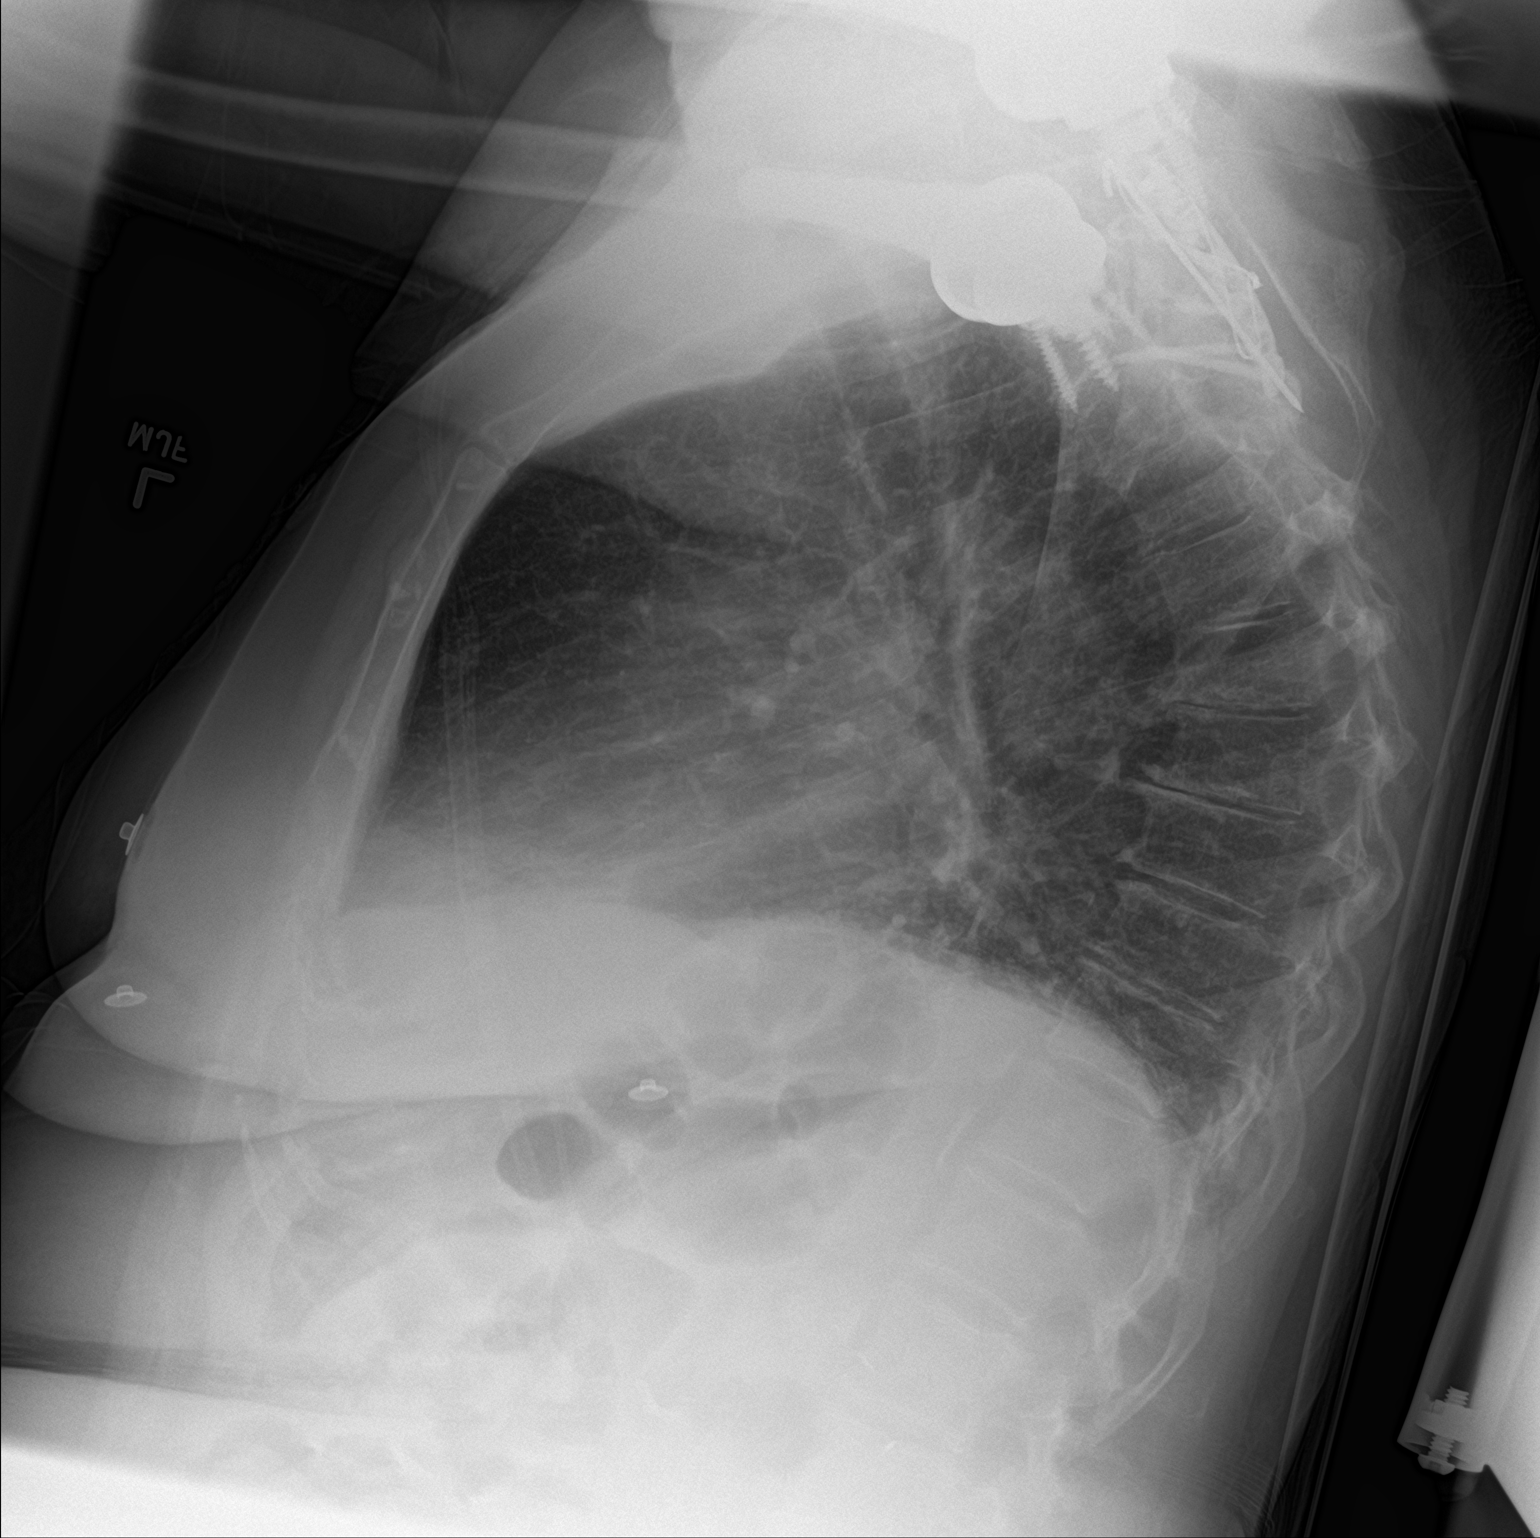
[im 2/2]
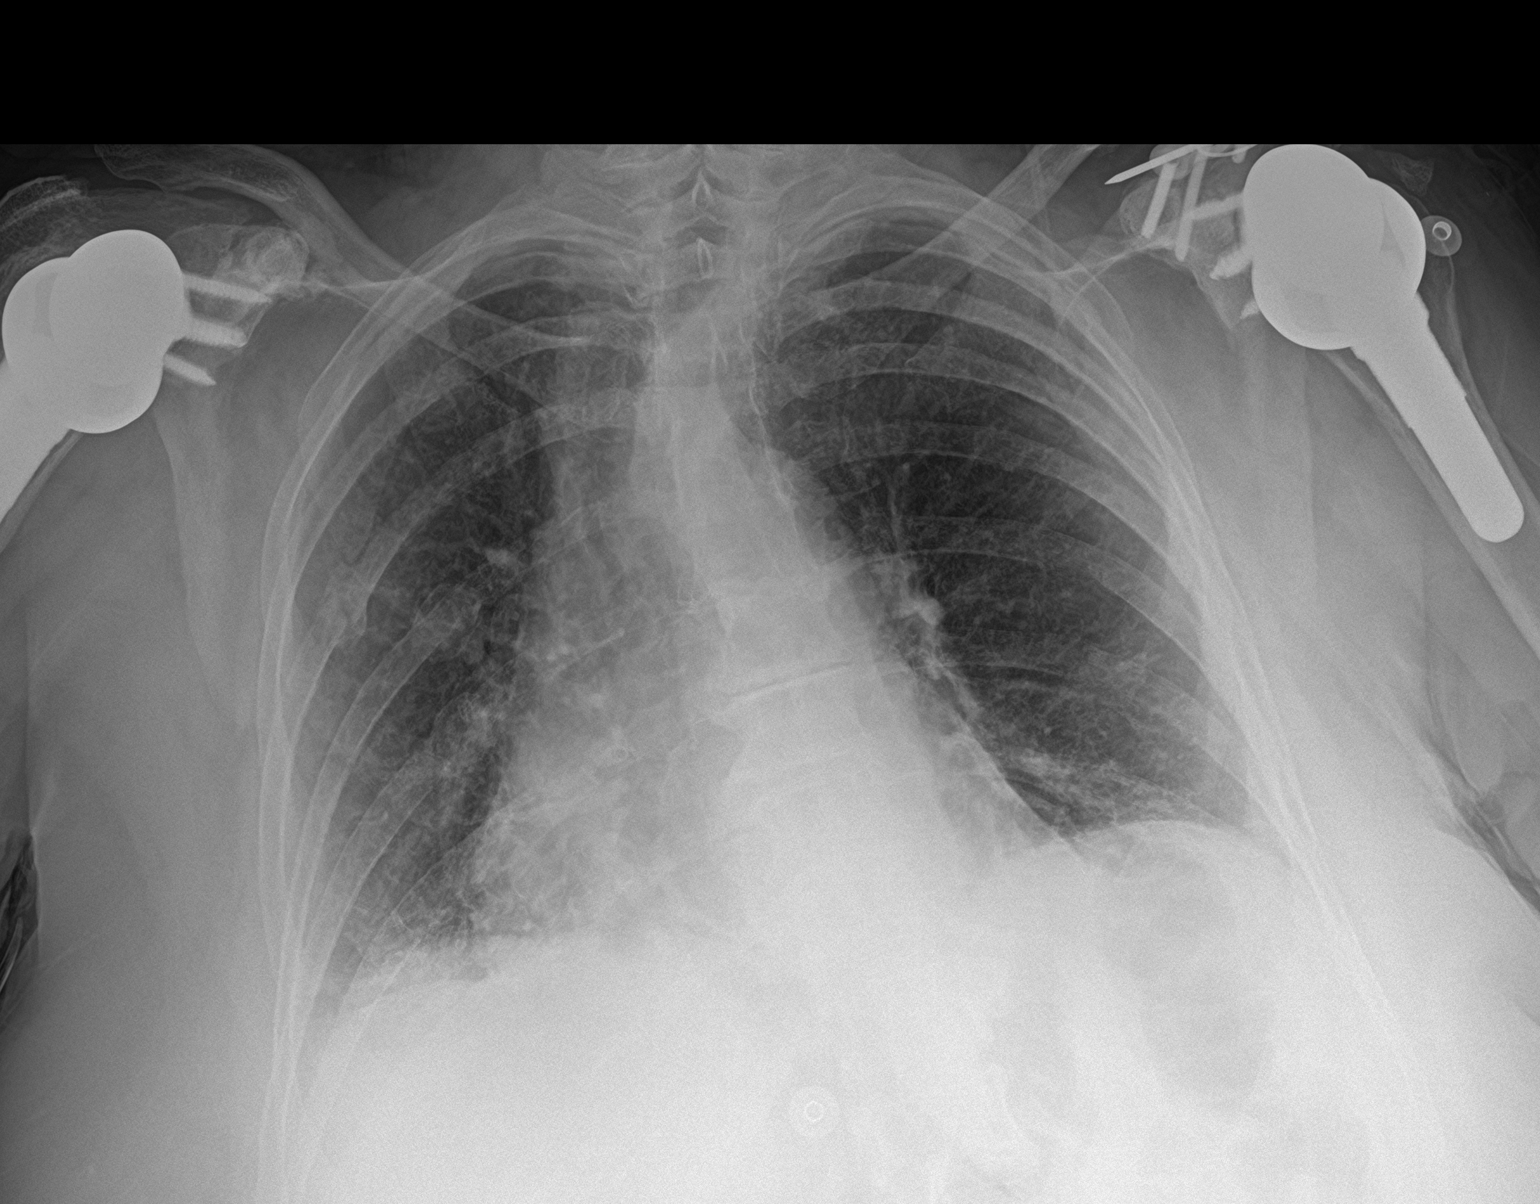

[2 of 2 positions shown; findings below may reference images not displayed]

FINDINGS: The heart size and mediastinal contours are within normal limits.
Both lungs are clear. Status post bilateral reverse shoulder
arthroplasty. Multiple chronic, callused fractures of the right
ribs. Disc degenerative disease of the thoracic spine.
IMPRESSION: No acute abnormality of the lungs.

## 2021-10-28 IMAGING — CR DG HIP (WITH OR WITHOUT PELVIS) 2-3V*L*
1 series · 3 of 3 positions shown · non-contrast
Comparison: 12/10/2011 left hip radiographs

CLINICAL DATA: Left hip pain, does the

EXAM:
DG HIP (WITH OR WITHOUT PELVIS) 2-3V LEFT

[Series 1: dg hip unilat w or w/o pelvis 2-3 views  · non-contrast · 0.14mm/px · 3 of 3 slices shown]
[im 1/3]
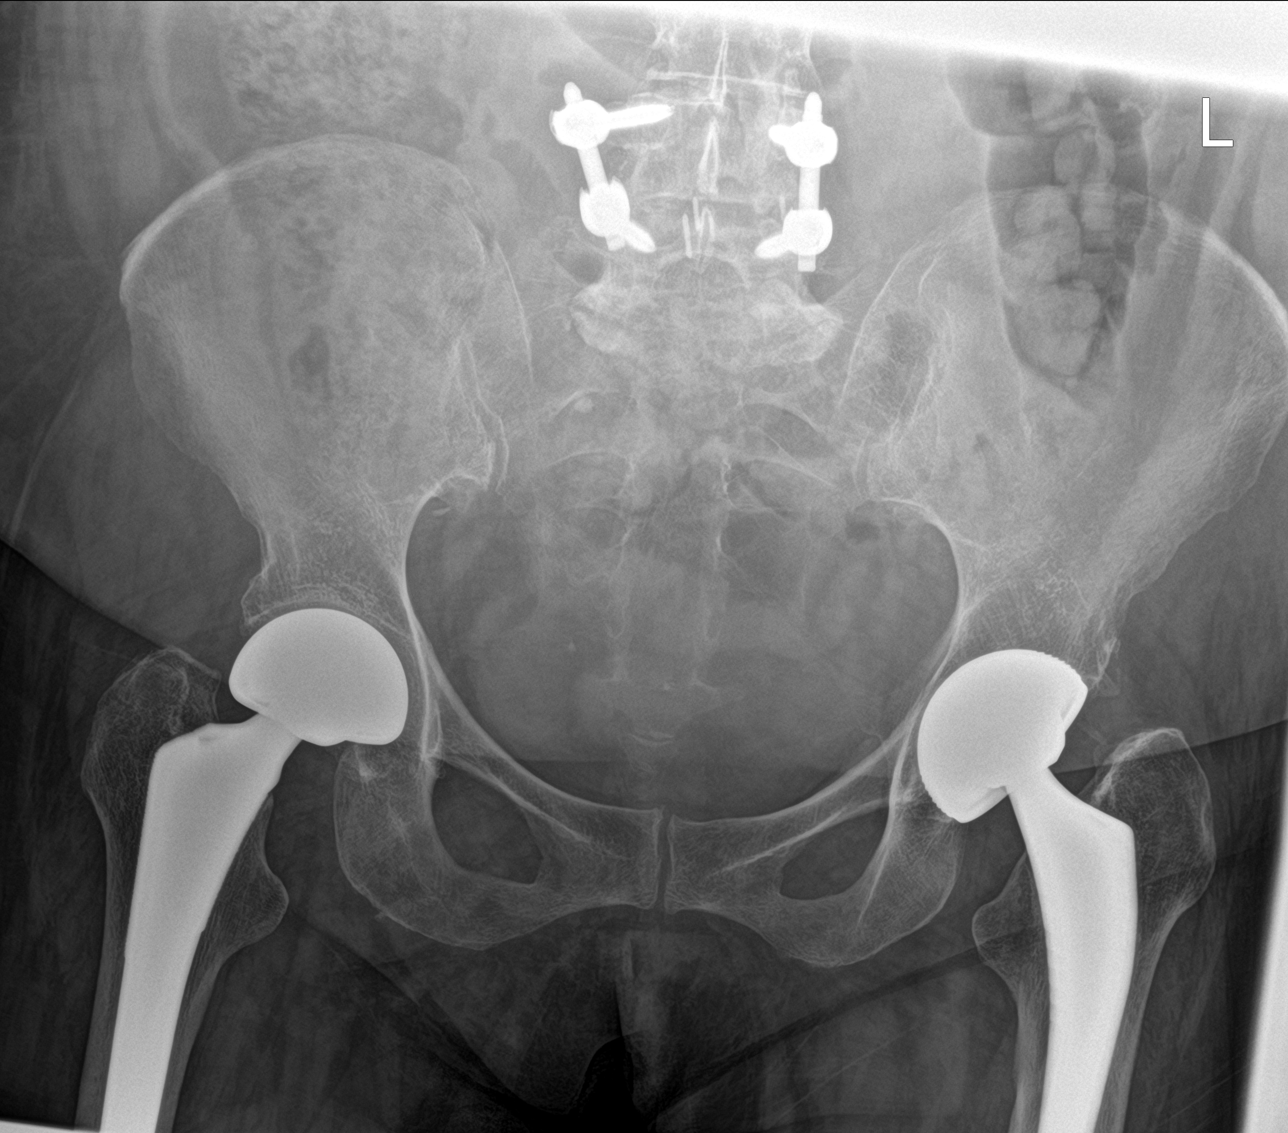
[im 2/3]
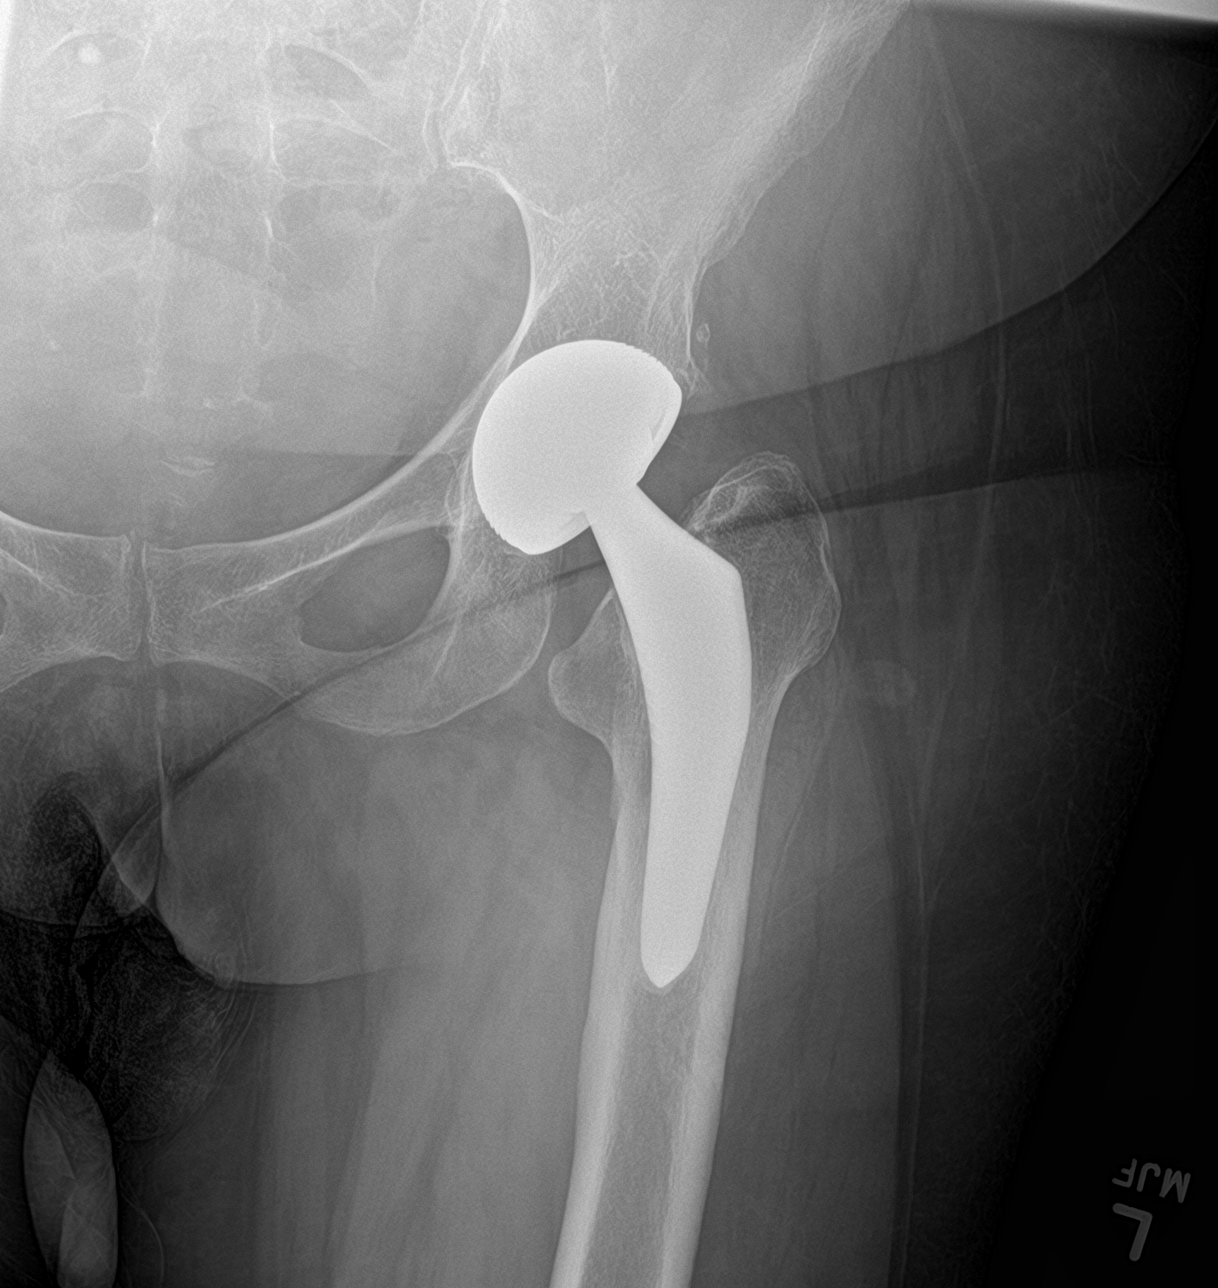
[im 3/3]
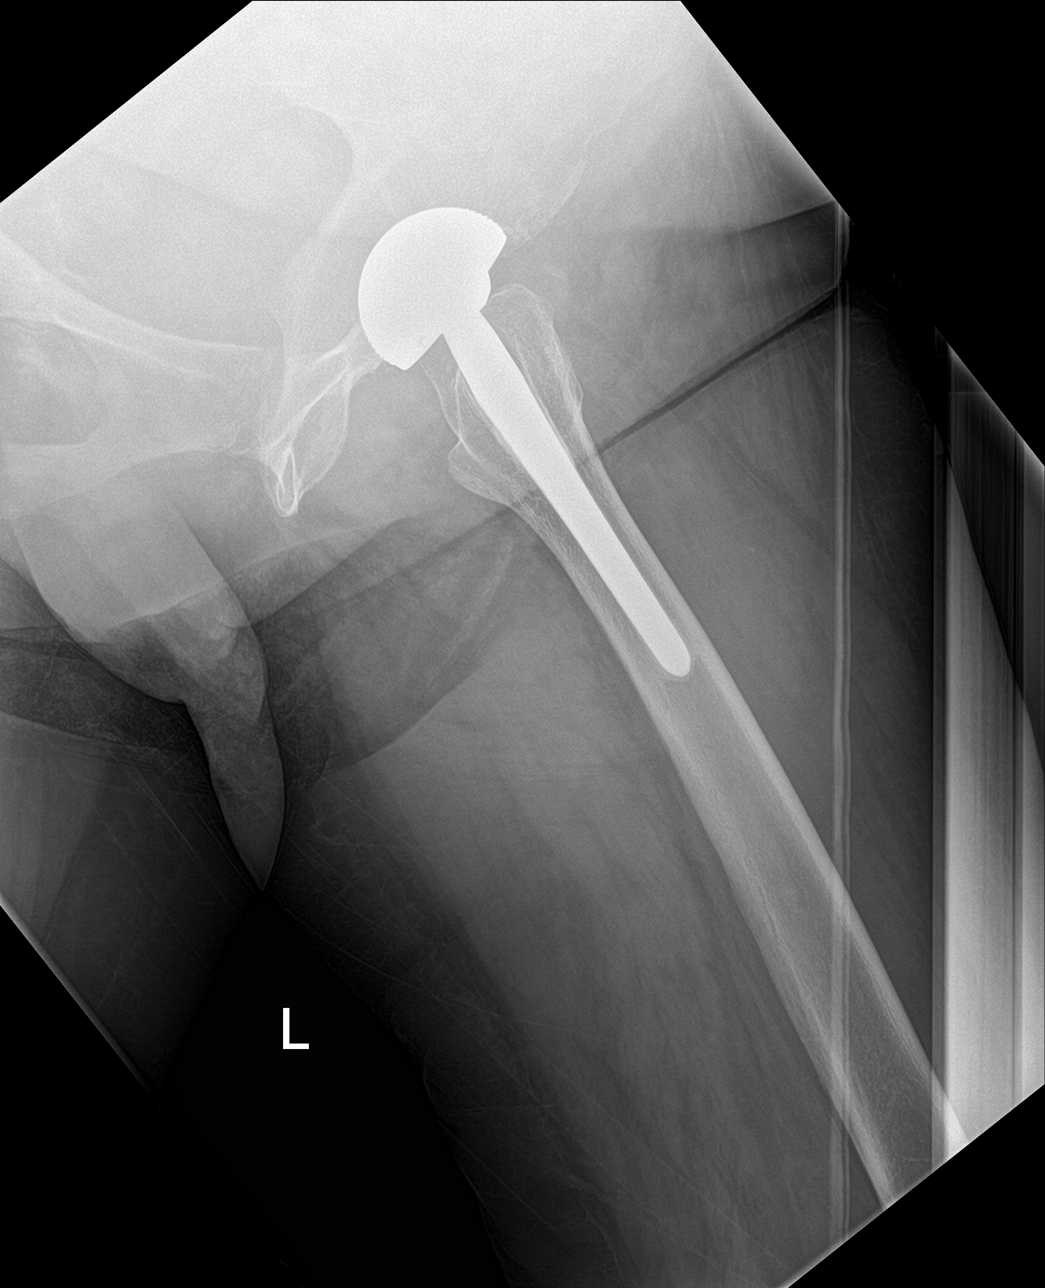

[3 of 3 positions shown; findings below may reference images not displayed]

FINDINGS: Status post left total hip arthroplasty. Partially visualized right
total hip arthroplasty. No evidence of hardware fracture or
loosening. No pelvic fracture or diastasis. No left hip osseous
fracture or dislocation. No focal osseous lesions. Bilateral
posterior spinal fusion in the lower lumbar spine.
IMPRESSION: No acute osseous abnormality. Bilateral total hip arthroplasty with
no evidence of hardware complication.

## 2021-10-31 DIAGNOSIS — Z96641 Presence of right artificial hip joint: Secondary | ICD-10-CM | POA: Diagnosis not present

## 2021-10-31 DIAGNOSIS — S42121D Displaced fracture of acromial process, right shoulder, subsequent encounter for fracture with routine healing: Secondary | ICD-10-CM | POA: Diagnosis not present

## 2021-10-31 DIAGNOSIS — M5416 Radiculopathy, lumbar region: Secondary | ICD-10-CM | POA: Diagnosis not present

## 2021-10-31 DIAGNOSIS — Z96611 Presence of right artificial shoulder joint: Secondary | ICD-10-CM | POA: Diagnosis not present

## 2021-10-31 DIAGNOSIS — M4326 Fusion of spine, lumbar region: Secondary | ICD-10-CM | POA: Diagnosis not present

## 2021-10-31 IMAGING — DX DG CHEST 1V PORT
1 series · 1 of 1 positions shown · non-contrast
Comparison: 10/29/20

CLINICAL DATA: Peripheral edema and difficulty breathing, initial
encounter

EXAM:
PORTABLE CHEST 1 VIEW

[chest ap]
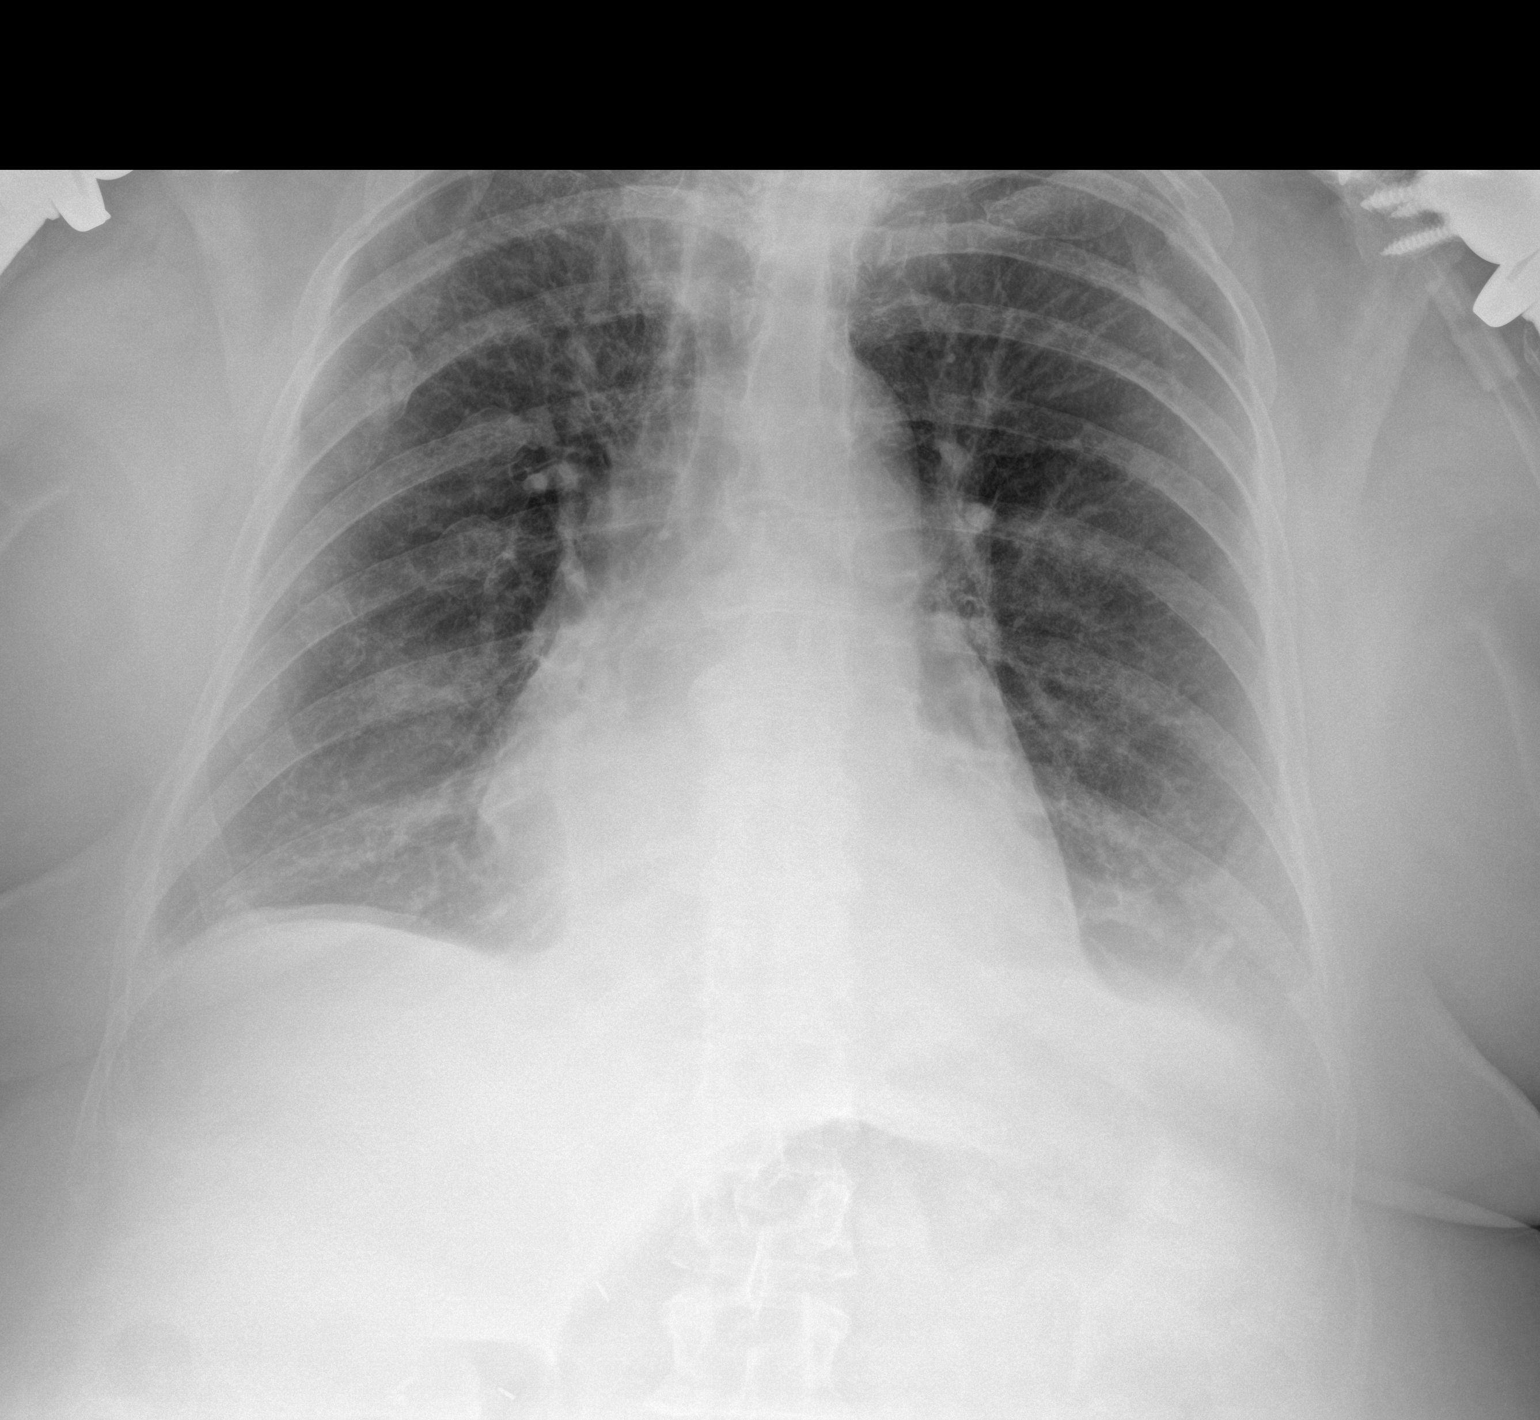

[1 of 1 positions shown; findings below may reference images not displayed]

FINDINGS: Cardiac shadow is stable. Lungs are well aerated bilaterally. Small
left-sided pleural effusion is noted new from the prior exam. No
focal infiltrate is seen. Old right rib fractures are again noted.
Bilateral shoulder surgeries are seen and stable.
IMPRESSION: New small left-sided pleural effusion.

## 2021-11-02 ENCOUNTER — Other Ambulatory Visit: Payer: Self-pay | Admitting: Gastroenterology

## 2021-11-02 ENCOUNTER — Other Ambulatory Visit: Payer: Self-pay | Admitting: Internal Medicine

## 2021-11-04 ENCOUNTER — Ambulatory Visit (INDEPENDENT_AMBULATORY_CARE_PROVIDER_SITE_OTHER): Payer: Medicare PPO | Admitting: Gastroenterology

## 2021-11-04 ENCOUNTER — Encounter: Payer: Self-pay | Admitting: Gastroenterology

## 2021-11-04 ENCOUNTER — Telehealth: Payer: Self-pay | Admitting: Internal Medicine

## 2021-11-04 VITALS — BP 165/65 | HR 92 | Temp 98.4°F | Wt 172.4 lb

## 2021-11-04 DIAGNOSIS — D500 Iron deficiency anemia secondary to blood loss (chronic): Secondary | ICD-10-CM | POA: Diagnosis not present

## 2021-11-04 DIAGNOSIS — K581 Irritable bowel syndrome with constipation: Secondary | ICD-10-CM

## 2021-11-04 NOTE — Telephone Encounter (Signed)
Pt would like to be called regarding her OT, PT and Aid. Pt has been without these services for three weeks

## 2021-11-04 NOTE — Progress Notes (Signed)
[name], MRCP(U.K) [address]  [place]  [place], [place] [address]  Main: [phone]  Fax: [phone]   Primary Care Physician: McLean-Scocuzza, Nino Glow, MD  Primary Gastroenterologist:  Dr. Jonathon Bellows   Chief Complaint  Patient presents with   Abdominal Pain    HPI: Kerri Carter is a 81 y.o. female  Summary of history :   Was initially seen on 10/24/2018.She has previously seen Dr Tiffany Kocher at [place] ,h/o dysphagia, diarrhea, iron deficiency anemia due to small bowel AVM's .  In 2018 she was seen by myself for abdominal pain and anemia.  A CT scan of the abdomen at that point of time showed an adnexal lesion. LFT's in 02/2014 showed an elevated alkaline phosphatase , normal lipase and Hb 11 grams . Iron studies normal .EGD/colonoscopy 04/2016 ,EGD - mild chronic duodenitis on bx , normal villi. Gastric biopsies showed erosive gastritis , normal esophageal biopsies. Random colon biopsies showed mild active colitis., 3 tubular adenomas were excised. Celiac serology was negative , TSH normal  07/27/2018: Hemoglobin 11.3 g.  09/30/2018 hemoglobin 12.4 g.    10/24/2018 she states that she had abdominal pain for 6 months which is cramping in nature, nonradiating, on and off, preceded by bowel movement and relieved subsequently.  Occasionally also has pain after bowel movement.  Stool is the consistency of pudding.  Has a bowel movement daily.    Had significant improvement with Bentyl and fiber pills     Interval history 02/13/2019-11/04/2021  10/09/2021: Hb 11.2 , mcv 82, cr 1.33, ferritin 142   09/09/2021: Hb 6.9 grams with mcv 75 when admitted with COPD exaceberation , CHF, ILD . Toe fracture. No frank bleeding seen , Was on eloquis.   Presently denies any blood in her stool stool is dark in color because she takes iron.  No abdominal pain no regular basis she has 3-4 bowel movements a day ranging from hard to soft she would like to go more consistently.  She says her  breathing is pretty bad can barely walk 6-8 steps before she needs to take a break props her head end of the bed at night.  Current Outpatient Medications  Medication Sig Dispense Refill   albuterol (VENTOLIN HFA) 108 (90 Base) MCG/ACT inhaler Inhale 2 puffs into the lungs every 6 (six) hours as needed for wheezing or shortness of breath. 18 g 11   amLODipine (NORVASC) 5 MG tablet Take 1 tablet (5 mg total) by mouth daily. 90 tablet 3   apixaban (ELIQUIS) 5 MG TABS tablet Take 1 tablet (5 mg total) by mouth 2 (two) times daily. 180 tablet 1   budesonide (PULMICORT) 0.25 MG/2ML nebulizer solution USE 1 VIAL  IN  NEBULIZER TWICE  DAILY - rinse mouth after treatment 120 mL 5   busPIRone (BUSPAR) 5 MG tablet Take 1 tablet (5 mg total) by mouth 2 (two) times daily.     Cholecalciferol (VITAMIN D3) 50 MCG (2000 UT) TABS Take 4,000 Units by mouth daily. 90 tablet 3   dicyclomine (BENTYL) 10 MG capsule TAKE 1 CAPSULE (10 MG TOTAL) BY MOUTH 2 (TWO) TIMES DAILY AS NEEDED FOR SPASMS. 180 capsule 2   escitalopram (LEXAPRO) 10 MG tablet Take 1 tablet (10 mg total) by mouth daily. 90 tablet 3   etodolac (LODINE) 500 MG tablet Take 1 tablet by mouth daily.     famotidine (PEPCID) 20 MG tablet TAKE 1 TABLET (20 MG TOTAL) BY MOUTH DAILY. BEFORE  BREAKFAST OR DINNER 90 tablet 1   ferrous sulfate 325 (65 FE) MG EC tablet Take 1 tablet (325 mg total) by mouth 2 (two) times daily. (Patient taking differently: Take 325 mg by mouth 2 (two) times daily. Pt reports MD instructed her to take twice a day every other day.) 60 tablet 3   fluticasone (FLONASE) 50 MCG/ACT nasal spray Place 2 sprays into both nostrils daily. In am     gabapentin (NEURONTIN) 300 MG capsule TAKE 2 CAPSULES (600 MG TOTAL) BY MOUTH IN THE MORNING AND AT BEDTIME. 360 capsule 2   ipratropium-albuterol (DUONEB) 0.5-2.5 (3) MG/3ML SOLN Take 3 mLs by nebulization every 6 (six) hours as needed. 360 mL 5   lamoTRIgine (LAMICTAL) 100 MG tablet TAKE 1 TAB 2  TIMES DAILY. FURTHER REFILLS NEW PSYCH FOR ALL PSYCH MEDS ONLY TEMP SUPPLY FROM PCP 180 tablet 1   leflunomide (ARAVA) 20 MG tablet Take 1 tablet (20 mg total) by mouth daily.     lidocaine (LIDODERM) 5 % Place 1 patch onto the skin 2 (two) times daily as needed. Remove & Discard patch within 12 hours or as directed by MD (Patient taking differently: Place 1 patch onto the skin daily. Remove & Discard patch within 12 hours or as directed by MD) 120 patch 2   lovastatin (MEVACOR) 20 MG tablet TAKE 1 TABLET BY MOUTH EVERYDAY AT BEDTIME *STOP TALKING '40MG'$ * (Patient taking differently: Take 20 mg by mouth at bedtime.) 90 tablet 2   methocarbamol (ROBAXIN) 500 MG tablet Take 500 mg by mouth 4 (four) times daily as needed. Taking twice a day currently     methocarbamol (ROBAXIN) 500 MG tablet Take 1 tablet by mouth 3 (three) times daily.     methylPREDNISolone (MEDROL DOSEPAK) 4 MG TBPK tablet Take by mouth.     mirabegron ER (MYRBETRIQ) 50 MG TB24 tablet Take 1 tablet (50 mg total) by mouth daily. 90 tablet 3   montelukast (SINGULAIR) 10 MG tablet TAKE 1 TABLET BY MOUTH EVERY DAY (Patient taking differently: Take 10 mg by mouth daily.) 90 tablet 1   multivitamin-lutein (OCUVITE-LUTEIN) CAPS capsule Take 1 capsule by mouth at bedtime.     nebivolol (BYSTOLIC) 5 MG tablet Take 1 tablet (5 mg total) by mouth in the morning and at bedtime. (Note dose changed from 1/2 10 mg bid to 5 mg bid) 180 tablet 3   OXYGEN Inhale 2 L into the lungs at bedtime.     pantoprazole (PROTONIX) 40 MG tablet TAKE 1 TABLET BY MOUTH 2 TIMES DAILY 30 MIN BEFORE FOOD (NOTE REDUCTION IN FREQUENCY) 180 tablet 1   QUEtiapine (SEROQUEL) 25 MG tablet TAKE 1 TABLET (25 MG TOTAL) BY MOUTH AT BEDTIME. AGAIN LAST FILL FURTHER REFILLS FROM PSYCHIATRY NO EXCEPTIONS 90 tablet 0   torsemide (DEMADEX) 20 MG tablet Take 1 tablet (20 mg total) by mouth daily. 30 tablet 3   zoledronic acid (RECLAST) 5 MG/100ML SOLN injection Inject 5 mg into the vein  once.     sucralfate (CARAFATE) 1 g tablet Take 1 tablet (1 g total) by mouth 2 (two) times daily. 180 tablet 3   No current facility-administered medications for this visit.    Allergies as of 11/04/2021 - Review Complete 11/04/2021  Allergen Reaction Noted   Ceftin [cefuroxime axetil] Anaphylaxis 09/08/2011   Lisinopril Anaphylaxis 09/08/2011   Sulfa antibiotics Other (See Comments) 04/25/2014   Sulfasalazine Anaphylaxis 03/08/2014   Morphine Other (See Comments) 02/25/2012   Xarelto [rivaroxaban] Other (See  Comments) 01/19/2012   Adhesive [tape] Rash 09/08/2011   Antihistamines, chlorpheniramine-type Other (See Comments) 09/08/2011   Antivert [meclizine hcl] Other (See Comments) 09/08/2011   Aspirin Other (See Comments) 09/15/2016   Contrast media [iodinated contrast media] Rash 12/07/2019   Decongestant [pseudoephedrine hcl] Other (See Comments) 09/08/2011   Doxycycline Other (See Comments) 09/08/2011   Levaquin [levofloxacin in d5w] Rash 09/08/2011   Polymyxin b Other (See Comments) 02/28/2016   Tetanus toxoids Rash and Other (See Comments) 09/08/2011    ROS:  General: Negative for anorexia, weight loss, fever, chills, fatigue, weakness. ENT: Negative for hoarseness, difficulty swallowing , nasal congestion. CV: Negative for chest pain, angina, palpitations, dyspnea on exertion, peripheral edema.  Respiratory: Negative for dyspnea at rest, dyspnea on exertion, cough, sputum, wheezing.  GI: See history of present illness. GU:  Negative for dysuria, hematuria, urinary incontinence, urinary frequency, nocturnal urination.  Endo: Negative for unusual weight change.    Physical Examination:   BP (!) 165/65   Pulse 92   Temp 98.4 F (36.9 C) (Oral)   Wt 172 lb 6.4 oz (78.2 kg)   BMI 32.57 kg/m   General: Appears very frail on oxygen Eyes: No icterus. Conjunctivae pink. Neuro: Alert and oriented x 3.  Grossly intact. Skin: Warm and dry, no jaundice.   Psych: Alert and  cooperative, normal mood and affect.   Imaging Studies: DG Lumbar Spine 2-3 Views  Result Date: 10/25/2021 CLINICAL DATA:  Lumbar fusion 05/22/2021. Lumbar radiculopathy, status post fusion. EXAM: LUMBAR SPINE - 2-3 VIEW COMPARISON:  Preoperative MRI 04/04/2021 FINDINGS: There are 5 non-rib-bearing lumbar vertebra. Posterior rod with intrapedicular screw fusion from L2 through L5. Lucency adjacent to the L3 pedicle screws measures up to 3 mm. There are interbody spacers in place. Trace anterolisthesis of L4 on L5 is stable from prior MRI. No evidence of fracture or bony destructive change. The bones are diffusely under mineralized. IMPRESSION: 1. Posterior lumbar rod and intrapedicular screw fusion from L2 through L5. Lucency of up to 3 mm adjacent to the L3 pedicle screws may be loosening. 2. Trace anterolisthesis of L4 on L5 is unchanged from prior MRI. Electronically Signed   By: Keith Rake M.D.   On: 10/25/2021 09:27    Assessment and Plan:   VONDRA ALDREDGE is a 81 y.o. y/o female with a prior history of abdominal pain felt to be of irritable bowel syndrome in nature.  Recent and prolonged admission to the hospital for COPD exacerbation she is on oxygen throughout the day.  Short of breath on minimal exertion during her hospitalization her hemoglobin did drop from baseline she has a history of small bowel AVMs has been on Eliquis likely had bled at that time presently denies any overt bleeding.  The GI point of view she would be extremely high risk for any endoscopy procedure and should not be done unless absolutely needed in situations such as when her life is threatened due to a GI bleed.  Otherwise we will monitor her and treat her symptomatically.  She would like to have more regular bowel movements.  I have suggested increasing dietary fiber.  Discussed briefly about use of MiraLAX.  Presently her stools are reasonably soft but she has multiple bowel movements.  I believe the bulk of her  stool she would do a better job with having more satisfaction after a bowel movement      Dr Jonathon Bellows  MD,MRCP [facility]) Follow up in as needed

## 2021-11-04 NOTE — Patient Instructions (Signed)
High-Fiber Eating Plan Fiber, also called dietary fiber, is a type of carbohydrate. It is found foods such as fruits, vegetables, whole grains, and beans. A high-fiber diet can have many health benefits. Your health care provider may recommend a high-fiber diet to help: Prevent constipation. Fiber can make your bowel movements more regular. Lower your cholesterol. Relieve the following conditions: Inflammation of veins in the anus (hemorrhoids). Inflammation of specific areas of the digestive tract (uncomplicated diverticulosis). A problem of the large intestine, also called the colon, that sometimes causes pain and diarrhea (irritable bowel syndrome, or IBS). Prevent overeating as part of a weight-loss plan. Prevent heart disease, type 2 diabetes, and certain cancers. What are tips for following this plan? Reading food labels  Check the nutrition facts label on food products for the amount of dietary fiber. Choose foods that have 5 grams of fiber or more per serving. The goals for recommended daily fiber intake include: Men (age 50 or younger): 34-38 g. Men (over age 50): 28-34 g. Women (age 50 or younger): 25-28 g. Women (over age 50): 22-25 g. Your daily fiber goal is _____________ g. Shopping Choose whole fruits and vegetables instead of processed forms, such as apple juice or applesauce. Choose a wide variety of high-fiber foods such as avocados, lentils, oats, and kidney beans. Read the nutrition facts label of the foods you choose. Be aware of foods with added fiber. These foods often have high sugar and sodium amounts per serving. Cooking Use whole-grain flour for baking and cooking. Cook with brown rice instead of white rice. Meal planning Start the day with a breakfast that is high in fiber, such as a cereal that contains 5 g of fiber or more per serving. Eat breads and cereals that are made with whole-grain flour instead of refined flour or white flour. Eat brown rice, bulgur  wheat, or millet instead of white rice. Use beans in place of meat in soups, salads, and pasta dishes. Be sure that half of the grains you eat each day are whole grains. General information You can get the recommended daily intake of dietary fiber by: Eating a variety of fruits, vegetables, grains, nuts, and beans. Taking a fiber supplement if you are not able to take in enough fiber in your diet. It is better to get fiber through food than from a supplement. Gradually increase how much fiber you consume. If you increase your intake of dietary fiber too quickly, you may have bloating, cramping, or gas. Drink plenty of water to help you digest fiber. Choose high-fiber snacks, such as berries, raw vegetables, nuts, and popcorn. What foods should I eat? Fruits Berries. Pears. Apples. Oranges. Avocado. Prunes and raisins. Dried figs. Vegetables Sweet potatoes. Spinach. Kale. Artichokes. Cabbage. Broccoli. Cauliflower. Green peas. Carrots. Squash. Grains Whole-grain breads. Multigrain cereal. Oats and oatmeal. Brown rice. Barley. Bulgur wheat. Millet. Quinoa. Bran muffins. Popcorn. Rye wafer crackers. Meats and other proteins Navy beans, kidney beans, and pinto beans. Soybeans. Split peas. Lentils. Nuts and seeds. Dairy Fiber-fortified yogurt. Beverages Fiber-fortified soy milk. Fiber-fortified orange juice. Other foods Fiber bars. The items listed above may not be a complete list of recommended foods and beverages. Contact a dietitian for more information. What foods should I avoid? Fruits Fruit juice. Cooked, strained fruit. Vegetables Fried potatoes. Canned vegetables. Well-cooked vegetables. Grains White bread. Pasta made with refined flour. White rice. Meats and other proteins Fatty cuts of meat. Fried chicken or fried fish. Dairy Milk. Yogurt. Cream cheese. Sour cream. Fats and   oils Butters. Beverages Soft drinks. Other foods Cakes and pastries. The items listed above may  not be a complete list of foods and beverages to avoid. Talk with your dietitian about what choices are best for you. Summary Fiber is a type of carbohydrate. It is found in foods such as fruits, vegetables, whole grains, and beans. A high-fiber diet has many benefits. It can help to prevent constipation, lower blood cholesterol, aid weight loss, and reduce your risk of heart disease, diabetes, and certain cancers. Increase your intake of fiber gradually. Increasing fiber too quickly may cause cramping, bloating, and gas. Drink plenty of water while you increase the amount of fiber you consume. The best sources of fiber include whole fruits and vegetables, whole grains, nuts, seeds, and beans. This information is not intended to replace advice given to you by your health care provider. Make sure you discuss any questions you have with your health care provider. Document Revised: 05/25/2019 Document Reviewed: 05/25/2019 Elsevier Patient Education  2023 [employer] Inc[employer]  

## 2021-11-05 ENCOUNTER — Telehealth: Payer: Self-pay

## 2021-11-05 NOTE — Telephone Encounter (Signed)
Forms have been faxed with completed transmission log and documented

## 2021-11-05 NOTE — Telephone Encounter (Signed)
11-05-21 signed [employer] forms have been faxed to fax number: [phone] with a completed transmission log.  Pt was notified of it being faxed

## 2021-11-07 ENCOUNTER — Telehealth: Payer: Self-pay

## 2021-11-07 NOTE — Telephone Encounter (Signed)
2 sets of orders for [employer] for the pt has been signed and faxed on today to fax number [phone]  With a completed transmission log

## 2021-11-12 ENCOUNTER — Other Ambulatory Visit: Payer: Self-pay | Admitting: Gastroenterology

## 2021-11-12 DIAGNOSIS — R1031 Right lower quadrant pain: Secondary | ICD-10-CM

## 2021-11-17 DIAGNOSIS — H5213 Myopia, bilateral: Secondary | ICD-10-CM | POA: Diagnosis not present

## 2021-11-17 DIAGNOSIS — H524 Presbyopia: Secondary | ICD-10-CM | POA: Diagnosis not present

## 2021-11-17 DIAGNOSIS — H3554 Dystrophies primarily involving the retinal pigment epithelium: Secondary | ICD-10-CM | POA: Diagnosis not present

## 2021-11-17 DIAGNOSIS — H353122 Nonexudative age-related macular degeneration, left eye, intermediate dry stage: Secondary | ICD-10-CM | POA: Diagnosis not present

## 2021-11-17 DIAGNOSIS — H353211 Exudative age-related macular degeneration, right eye, with active choroidal neovascularization: Secondary | ICD-10-CM | POA: Diagnosis not present

## 2021-11-17 DIAGNOSIS — H43813 Vitreous degeneration, bilateral: Secondary | ICD-10-CM | POA: Diagnosis not present

## 2021-11-17 DIAGNOSIS — H35363 Drusen (degenerative) of macula, bilateral: Secondary | ICD-10-CM | POA: Diagnosis not present

## 2021-11-17 DIAGNOSIS — D3131 Benign neoplasm of right choroid: Secondary | ICD-10-CM | POA: Diagnosis not present

## 2021-11-17 DIAGNOSIS — H04123 Dry eye syndrome of bilateral lacrimal glands: Secondary | ICD-10-CM | POA: Diagnosis not present

## 2021-11-19 IMAGING — CT CT CERVICAL SPINE W/O CM
3 of 4 series · 13 of 35 positions shown, 16 images · non-contrast
Comparison: 01/10/2019

CLINICAL DATA: Mechanical fall striking head

EXAM:
CT CERVICAL SPINE WITHOUT CONTRAST
TECHNIQUE: Multidetector CT imaging of the cervical spine was performed without
intravenous contrast. Multiplanar CT image reconstructions were also
generated.

[Series 4: coronal bone · coronal · 0.34mm/px · 3 of 70 slices shown]
[im 21/70  bone]
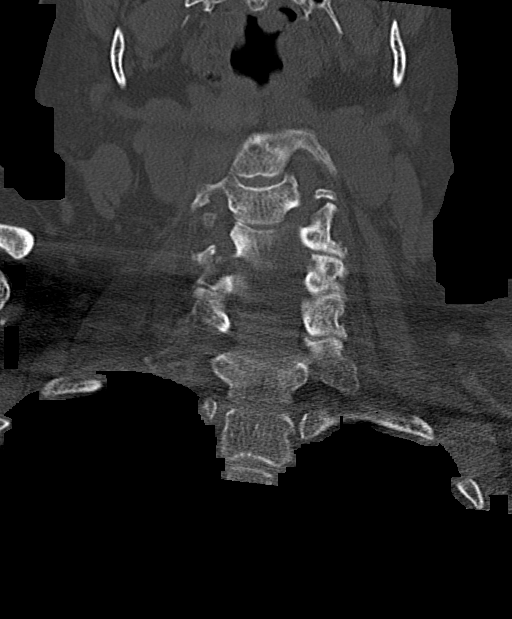
[im 30/70  bone]
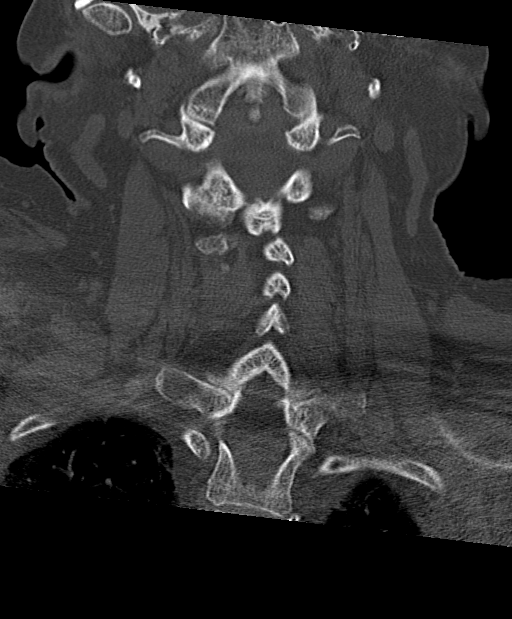
[im 40/70  bone]
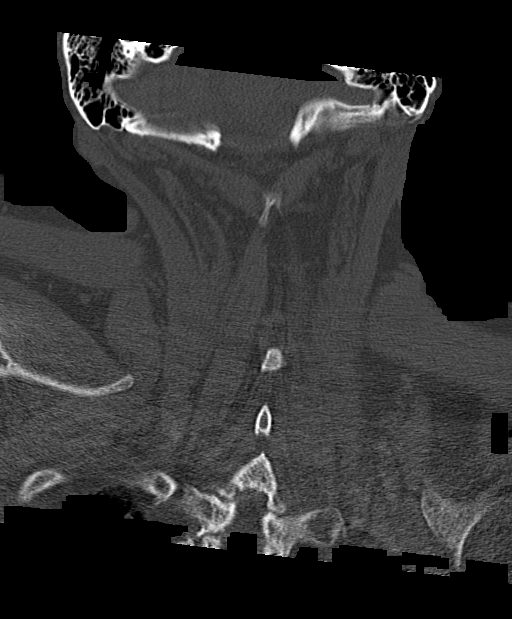

[Series 5: sagittal bone · sagittal · 0.32mm/px · 5 of 70 slices shown, 6 images]
[im 24/70  bone]
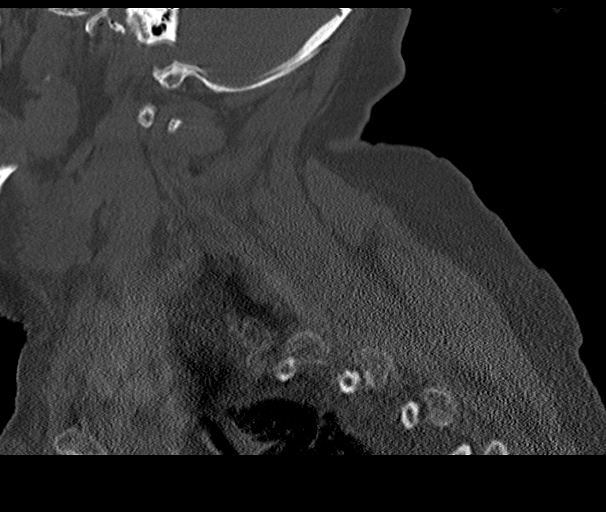
[im 29/70  bone]
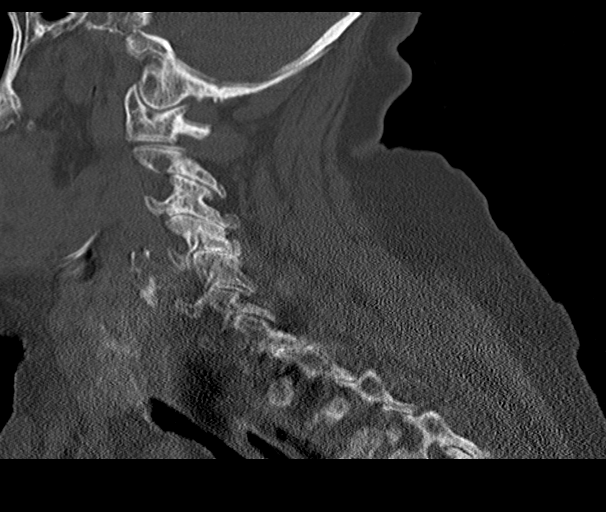
[im 35/70  soft-tissue]
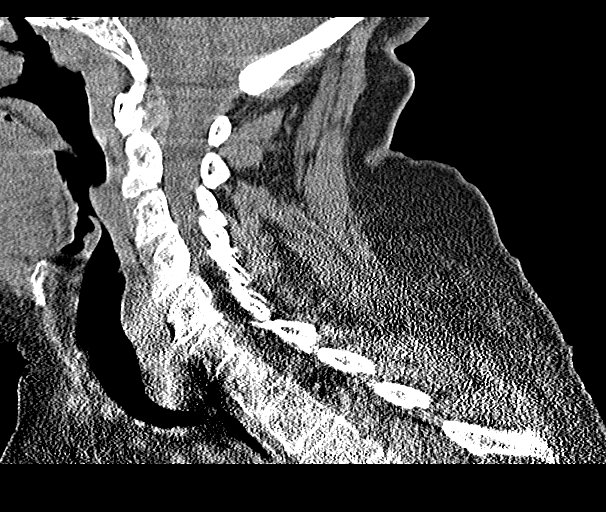
[im 35/70  bone]
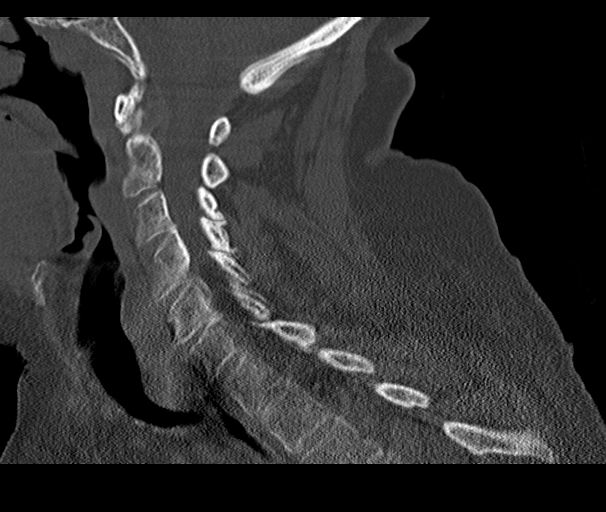
[im 41/70  bone]
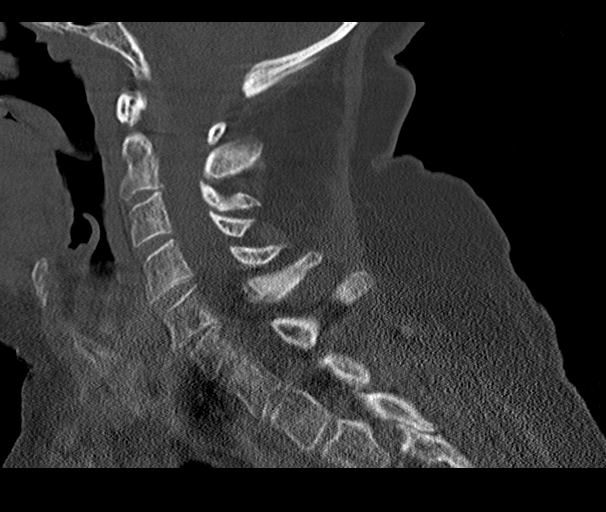
[im 47/70  bone]
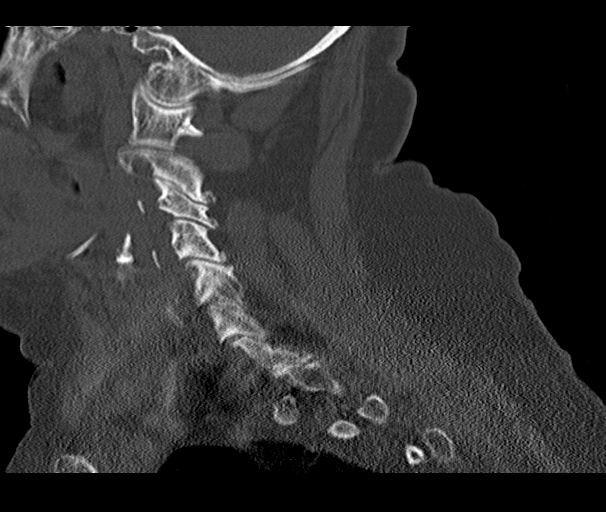

[Series 6: orthogonal bone · axial · 0.27mm/px · z∈[+147,+274]mm · 5 of 105 slices shown, 7 images]
[im 15/105  soft-tissue]
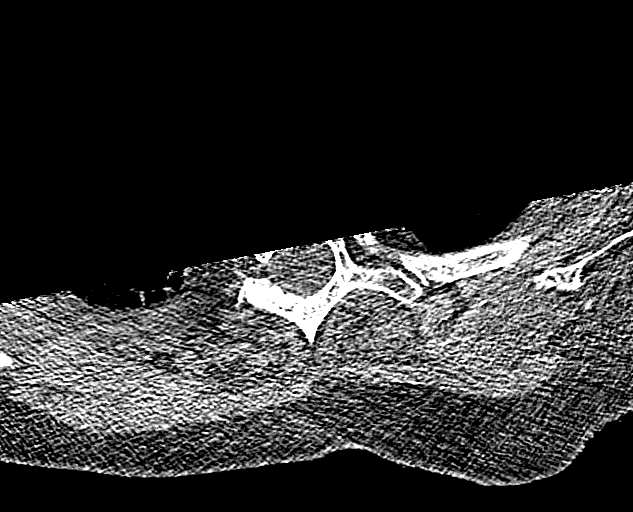
[im 15/105  bone]
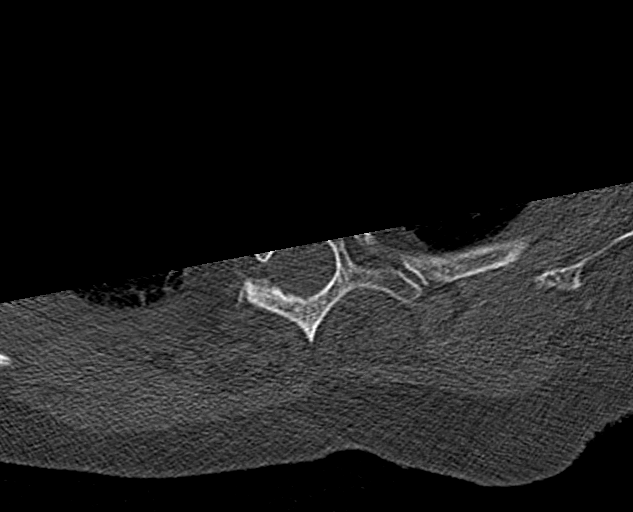
[im 30/105  bone]
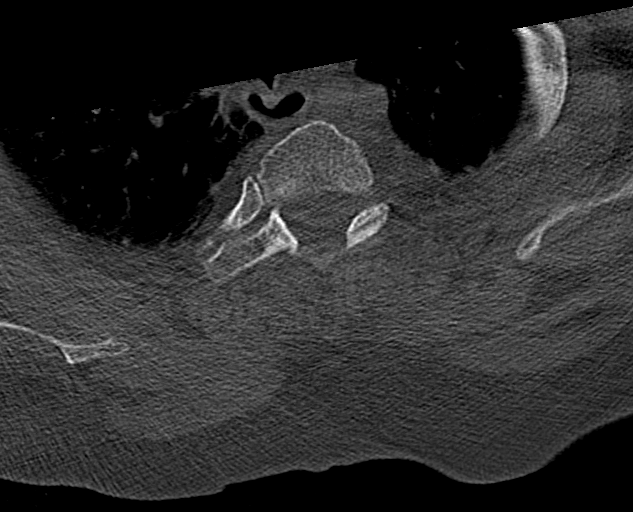
[im 60/105  bone]
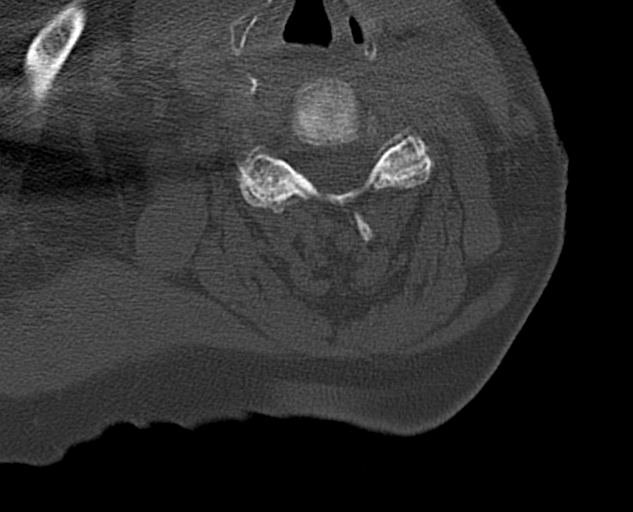
[im 75/105  bone]
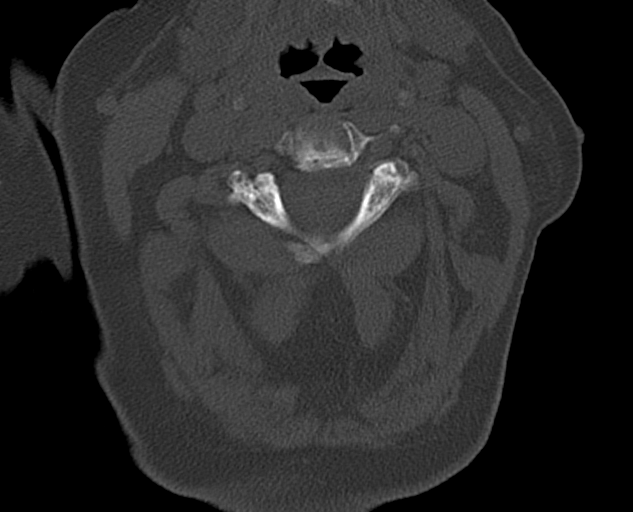
[im 90/105  soft-tissue]
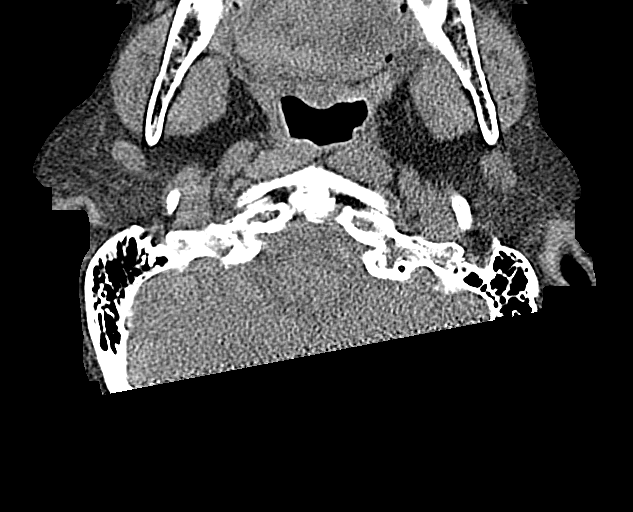
[im 90/105  bone]
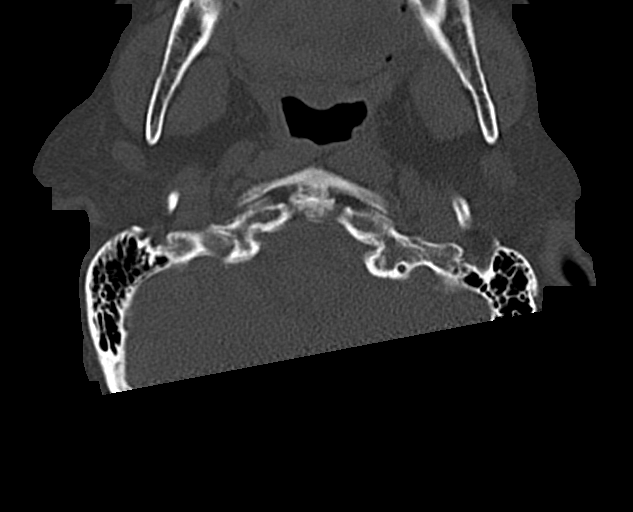

[13 of 35 positions shown; findings below may reference images not displayed]

FINDINGS: Despite efforts by the technologist and patient, motion artifact is
present on today's exam and could not be eliminated. This reduces
exam sensitivity and specificity.

Alignment: No vertebral subluxation is observed. Mild levoconvex
cervical scoliosis.

Skull base and vertebrae: Substantial spurring and loss of articular
space at the anterior C1-2 articulation. No fracture or acute bony
findings identified. Multilevel degenerative facet arthropathy is
specially at C2-3, C3-4, and C4-5 bilaterally.

Soft tissues and spinal canal: Bilateral common carotid
atherosclerotic calcification.

Disc levels: No substantial degree osseous foraminal narrowing is
identified.

Upper chest: Mild biapical pleuroparenchymal scarring.

Other: No supplemental non-categorized findings.
IMPRESSION: 1. No acute cervical spine findings.
2. Cervical spondylosis.
3. Mild bilateral common carotid atherosclerotic calcification.

## 2021-11-19 IMAGING — DX DG KNEE COMPLETE 4+V*L*
4 series · 4 of 4 positions shown · non-contrast
Comparison: None.

CLINICAL DATA: Fall

EXAM:
LEFT KNEE - COMPLETE 4+ VIEW

[knee ap]
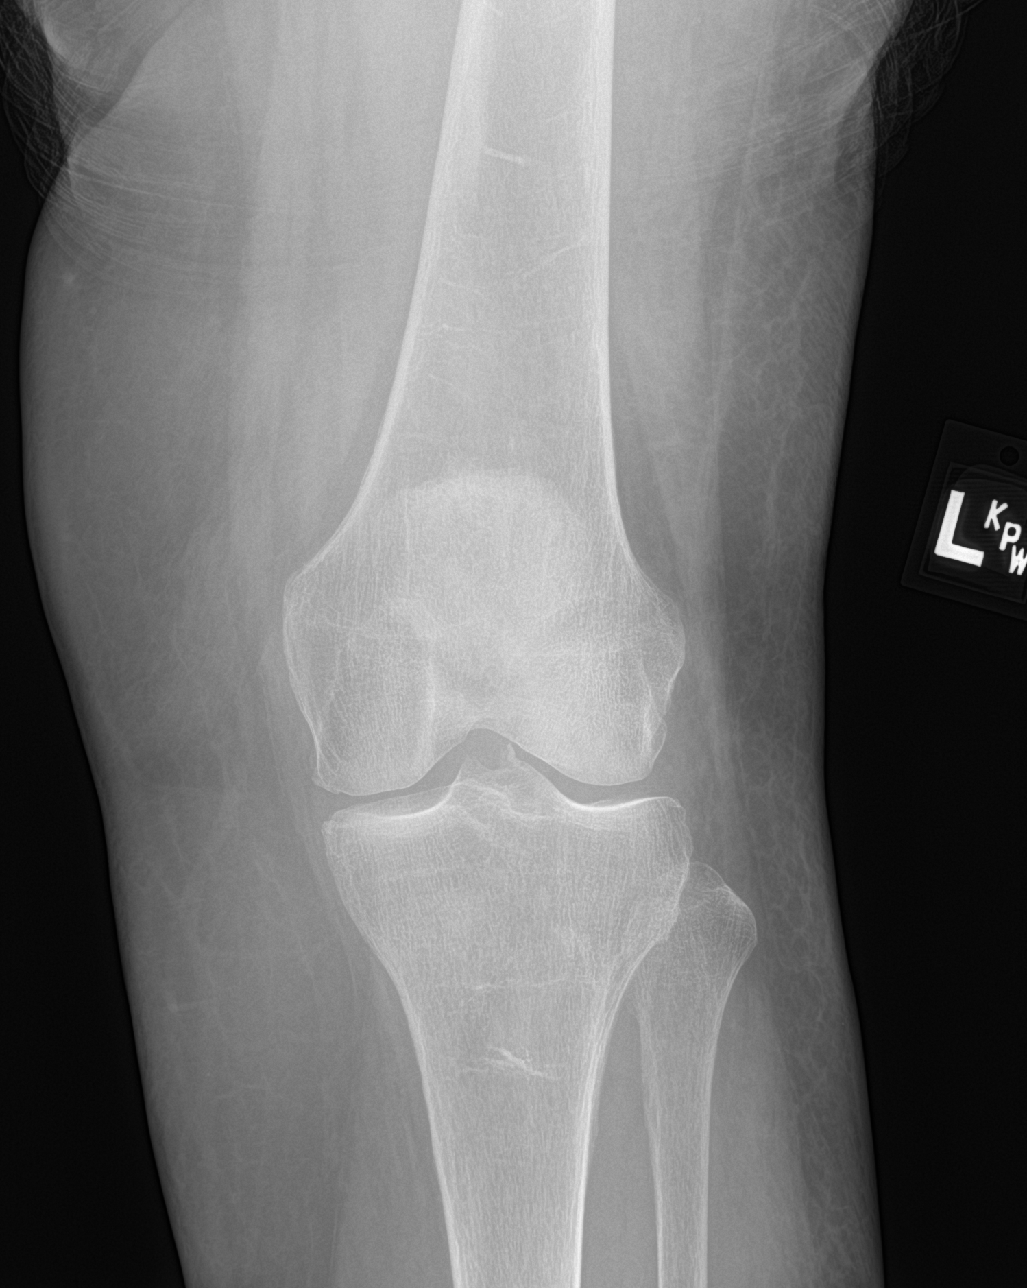

[knee lat]
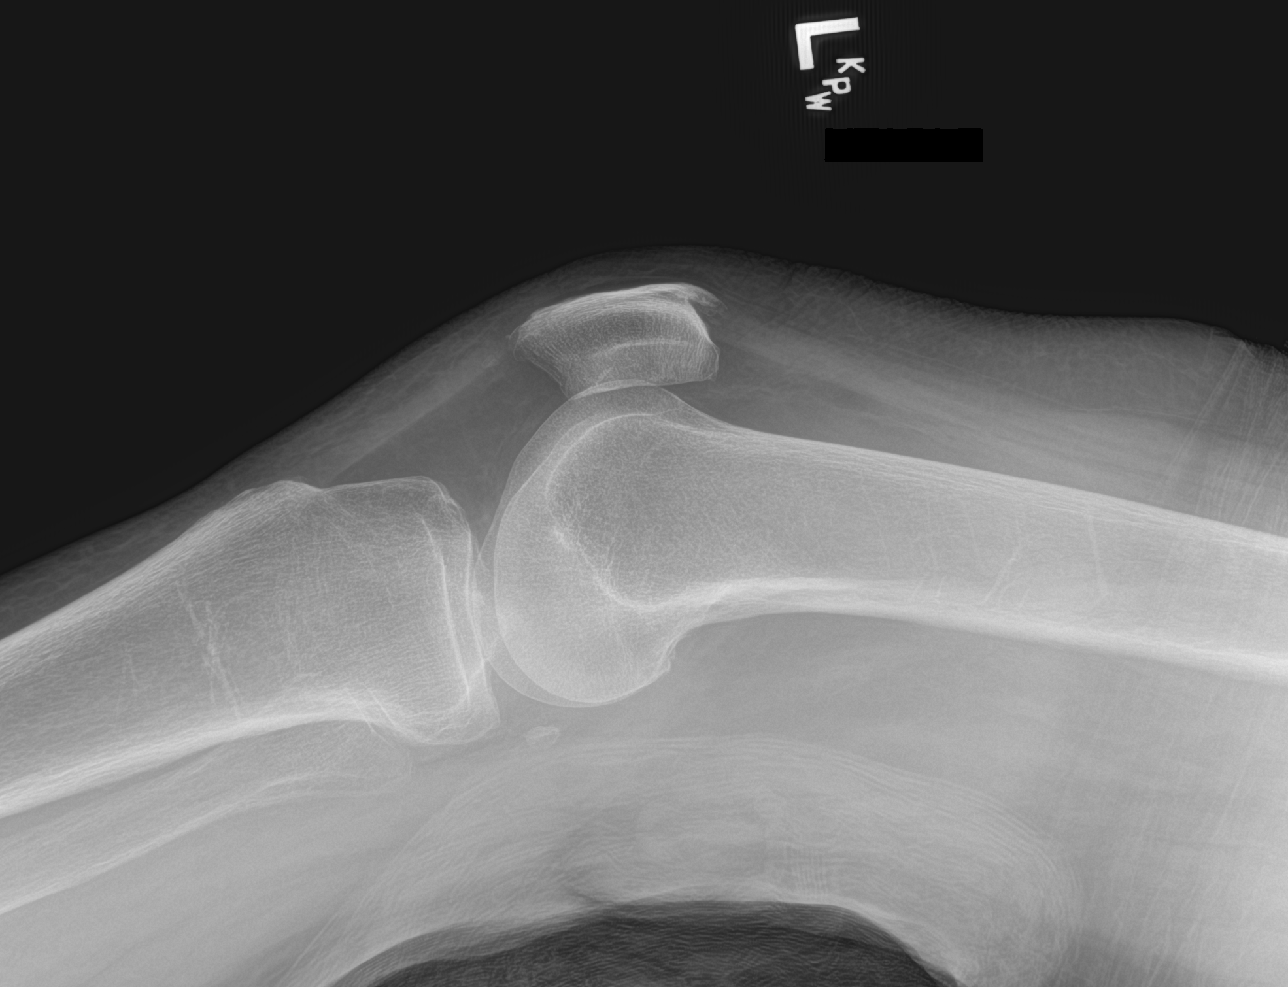

[knee obl (1 of 2)]
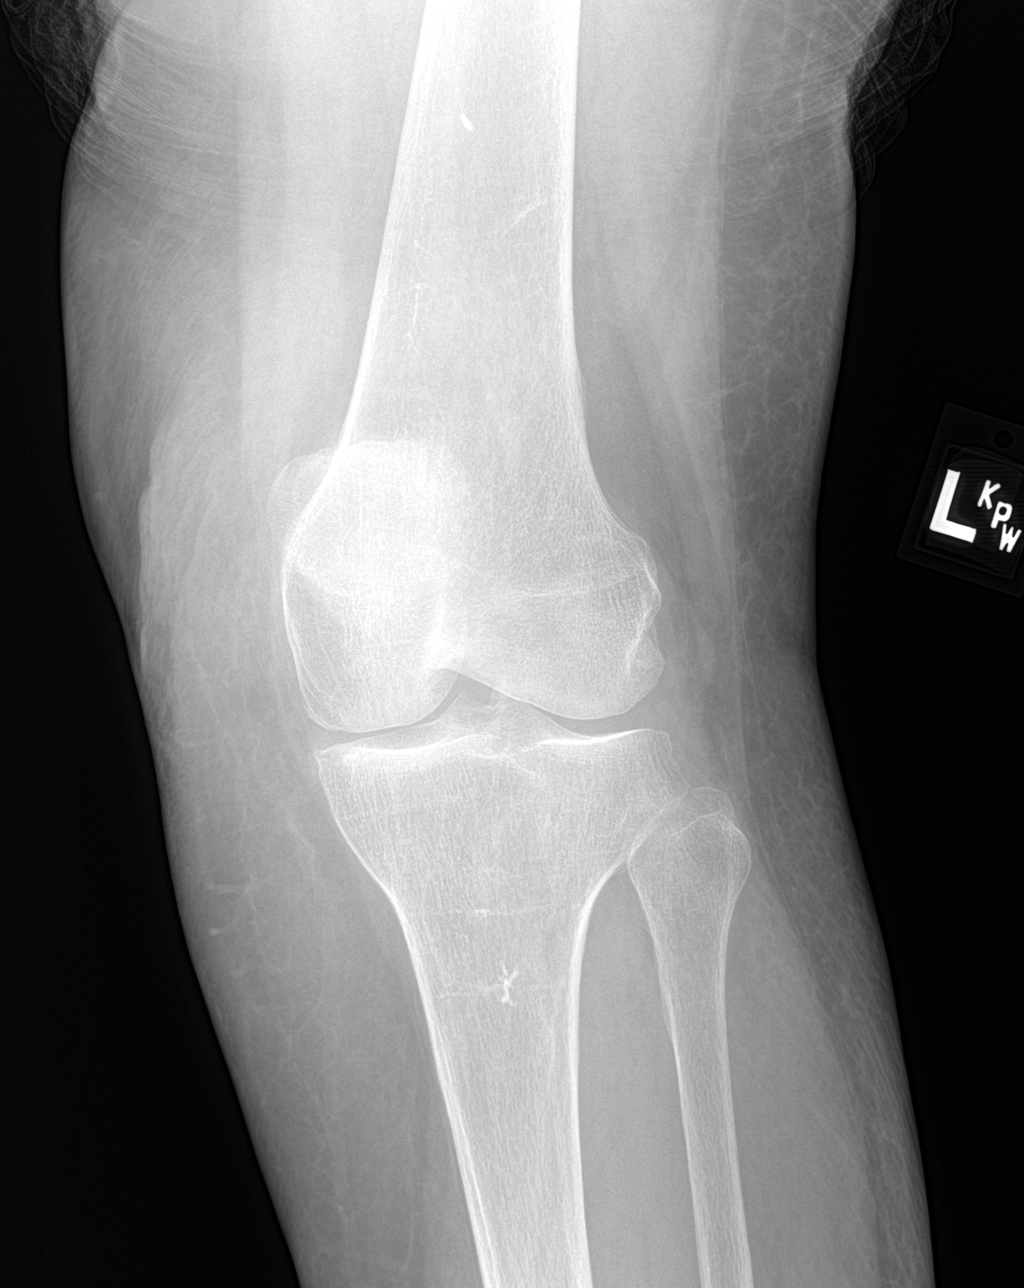

[knee obl (2 of 2)]
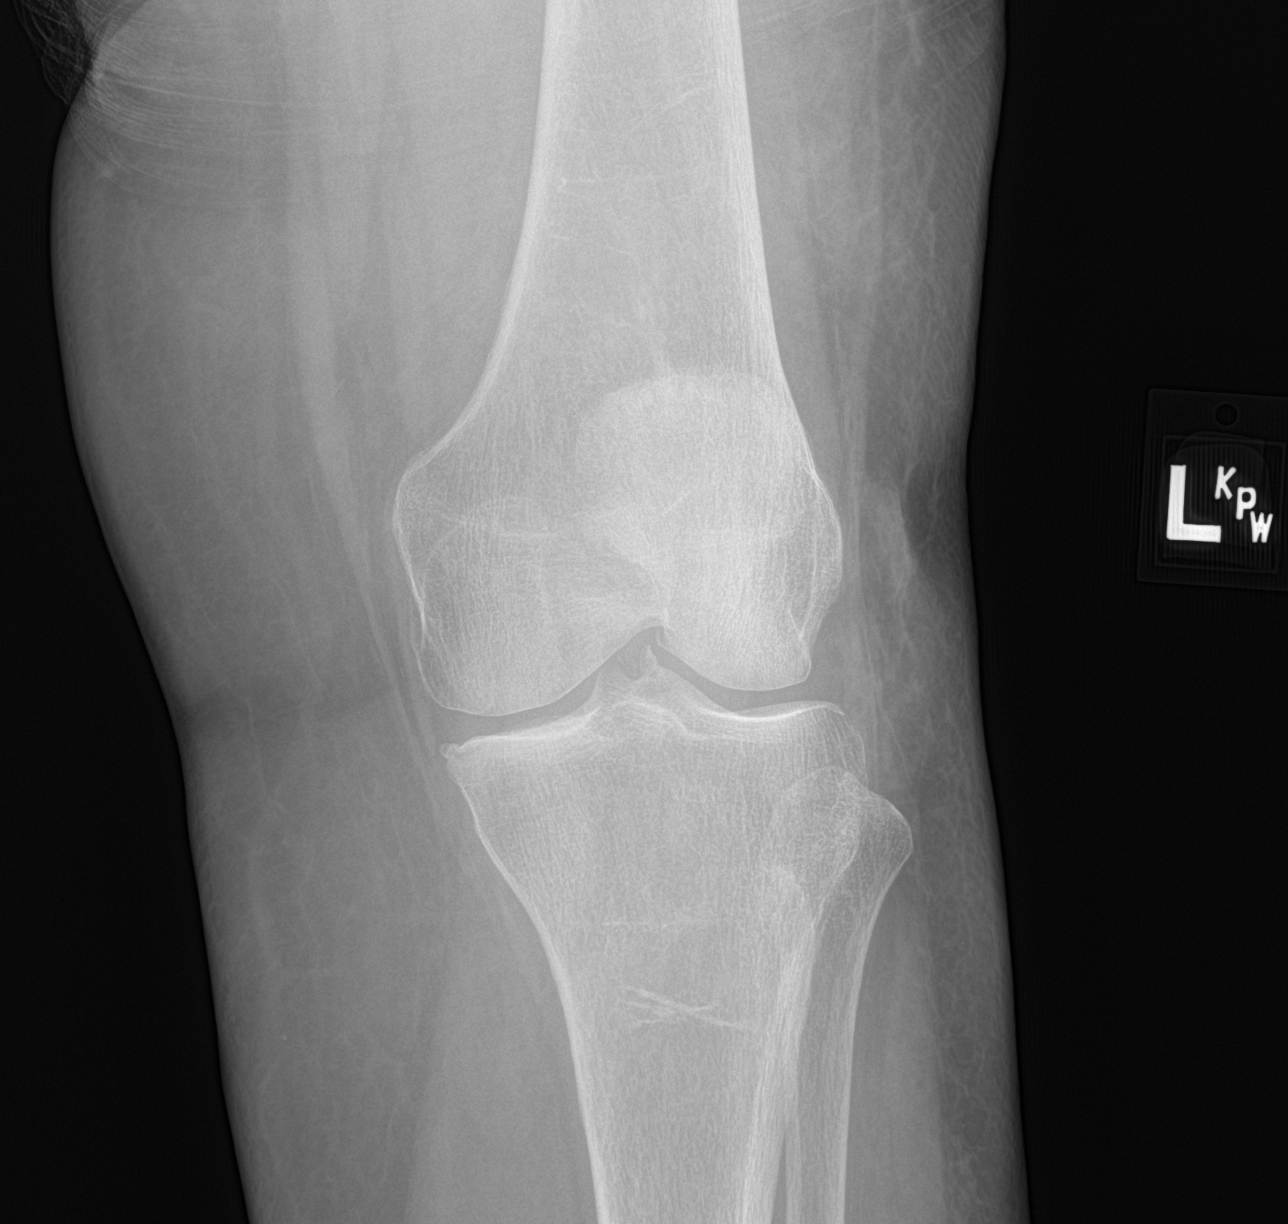

[4 of 4 positions shown; findings below may reference images not displayed]

FINDINGS: Negative for fracture or joint effusion. Mild marginal spurring
medially. No significant joint space narrowing.
IMPRESSION: Negative for fracture.  Mild degenerative change medially

## 2021-11-19 IMAGING — CT CT L SPINE W/O CM
3 series · 12 of 35 positions shown, 14 images · non-contrast
Comparison: 07/26/2020

CLINICAL DATA: Low back pain,  mechanical fall

EXAM:
CT LUMBAR SPINE WITHOUT CONTRAST
TECHNIQUE: Multidetector CT imaging of the lumbar spine was performed without
intravenous contrast administration. Multiplanar CT image
reconstructions were also generated.

[Series 4: l spine soft · axial · 0.26mm/px · z∈[-173,-29]mm · 4 of 105 slices shown, 5 images]
[im 17/105  soft-tissue]
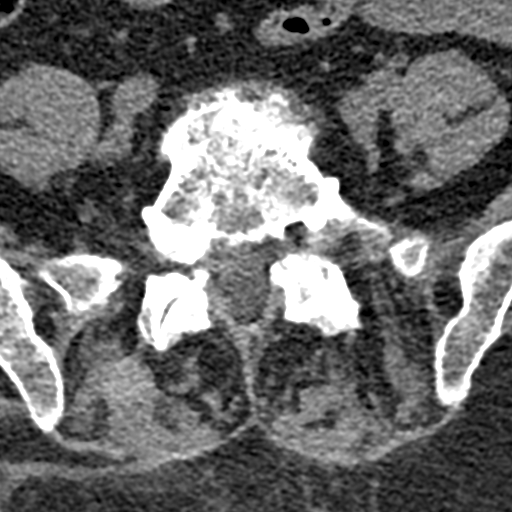
[im 17/105  bone]
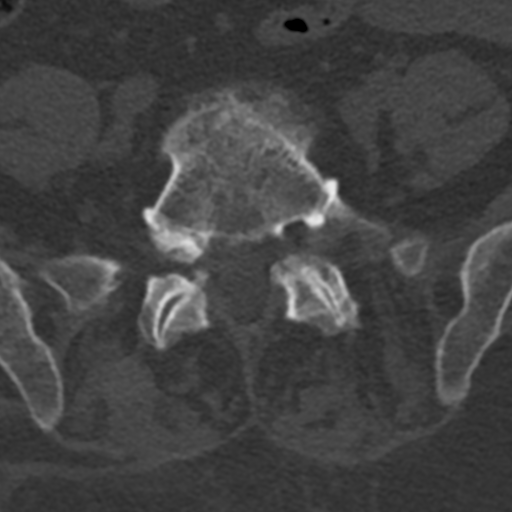
[im 41/105  bone]
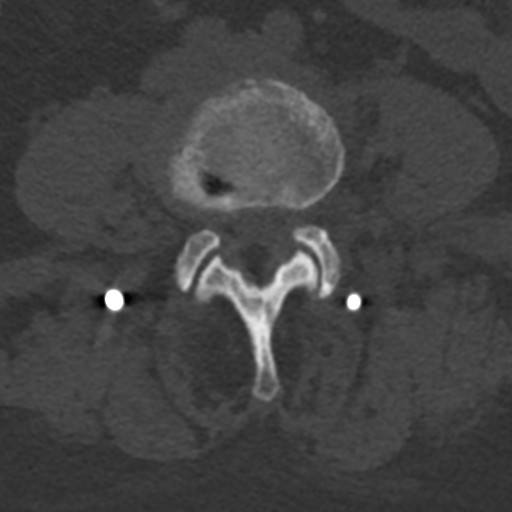
[im 65/105  bone]
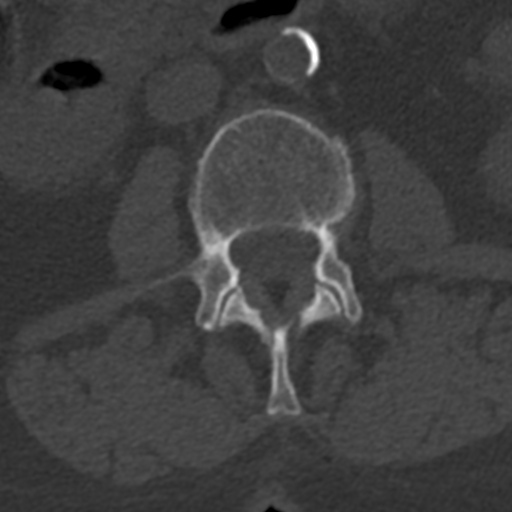
[im 89/105  bone]
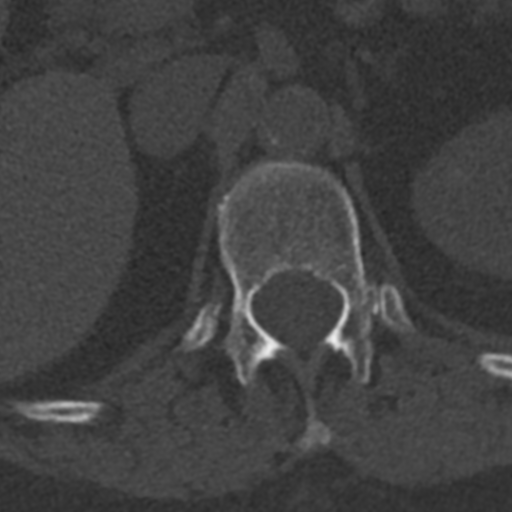

[Series 6: sagittal st · sagittal · 0.30mm/px · 5 of 76 slices shown, 6 images]
[im 26/76  bone]
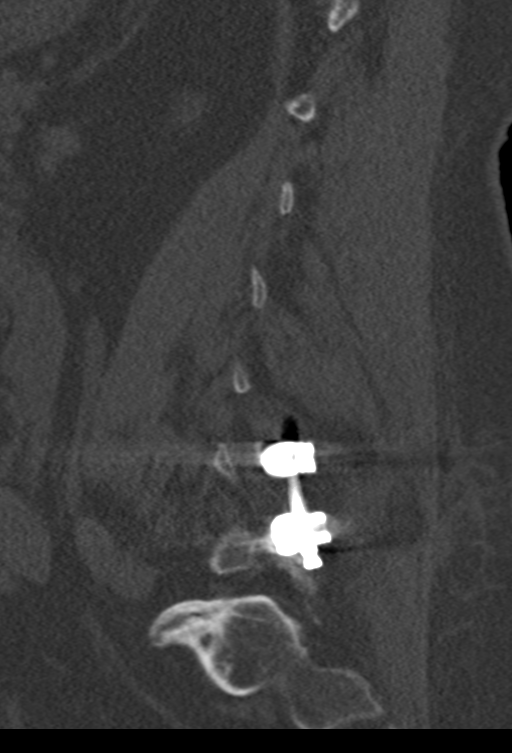
[im 32/76  bone]
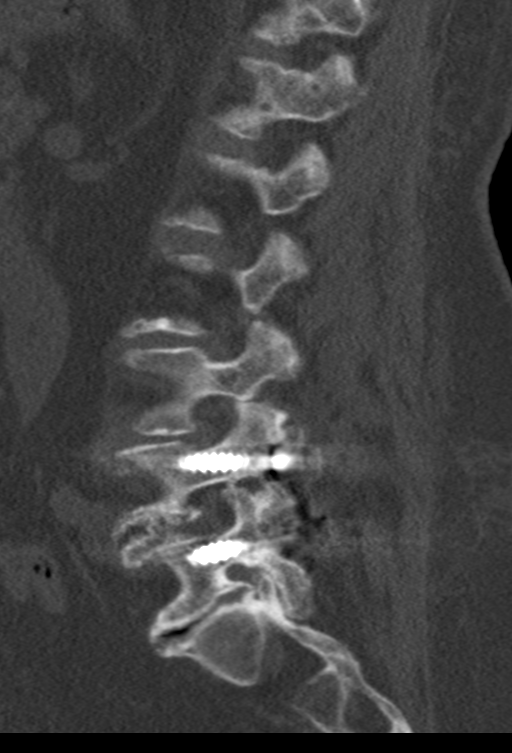
[im 38/76  soft-tissue]
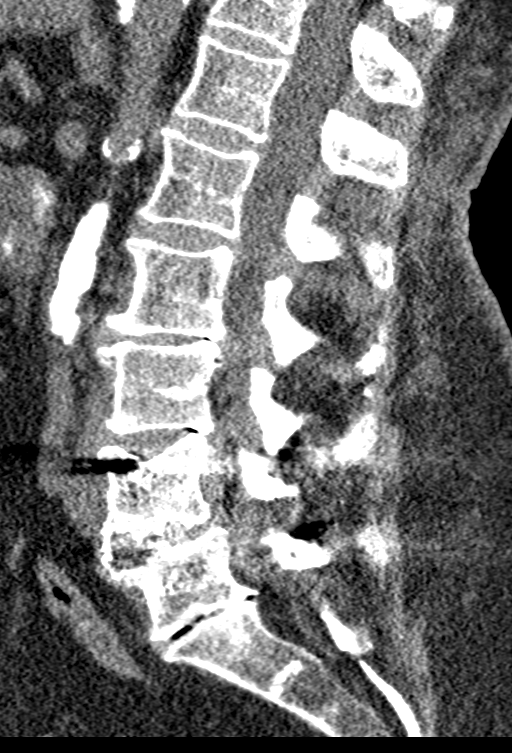
[im 38/76  bone]
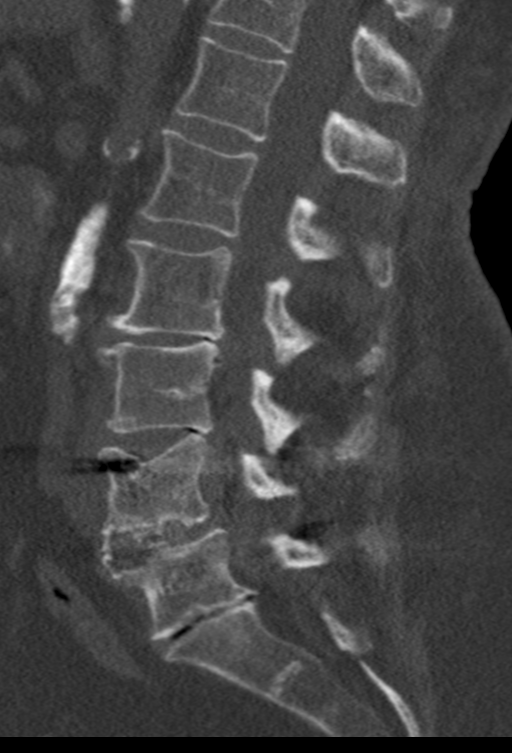
[im 44/76  bone]
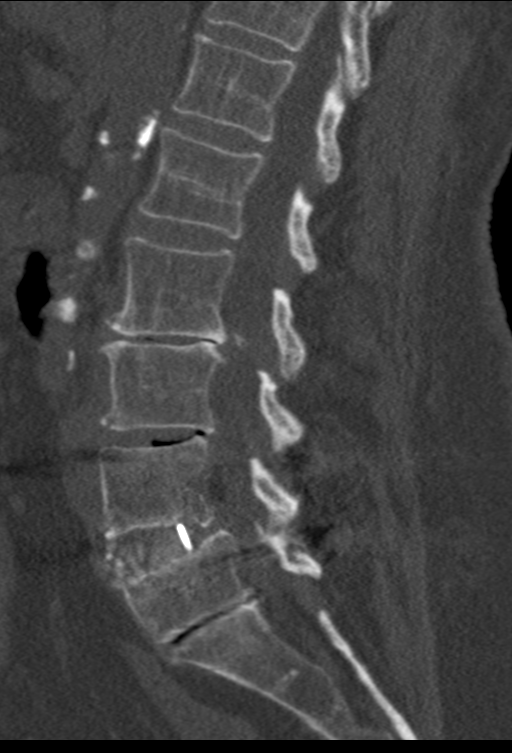
[im 51/76  bone]
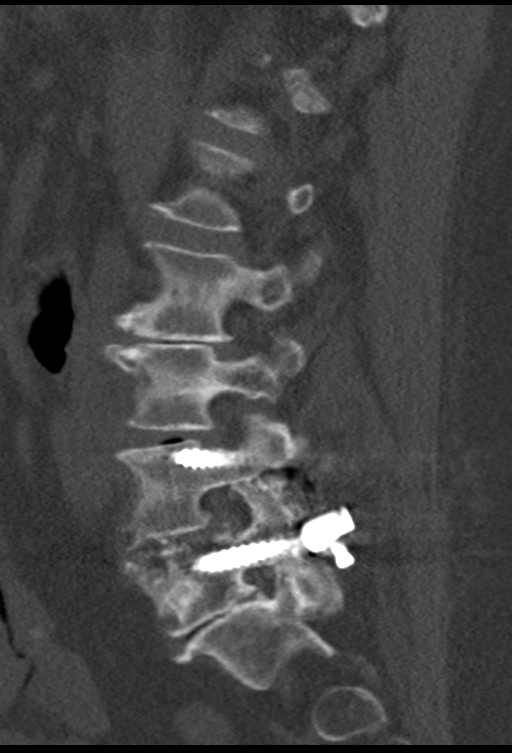

[Series 7: coronal bone · coronal · 0.31mm/px · 3 of 54 slices shown]
[im 11/54  bone]
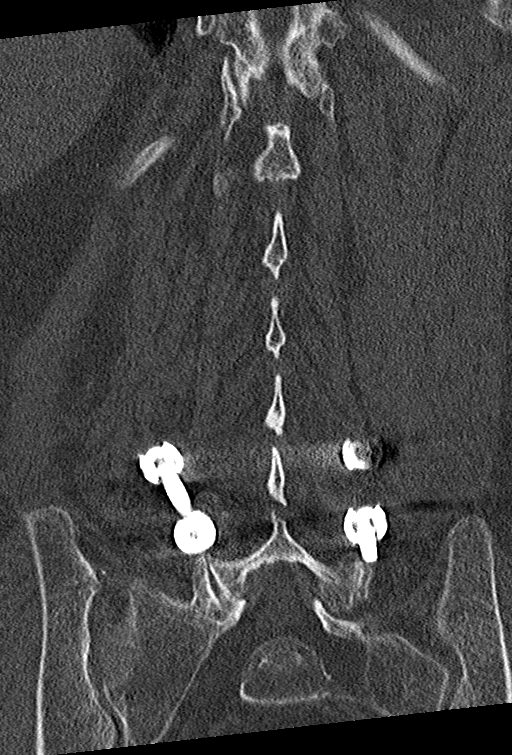
[im 22/54  bone]
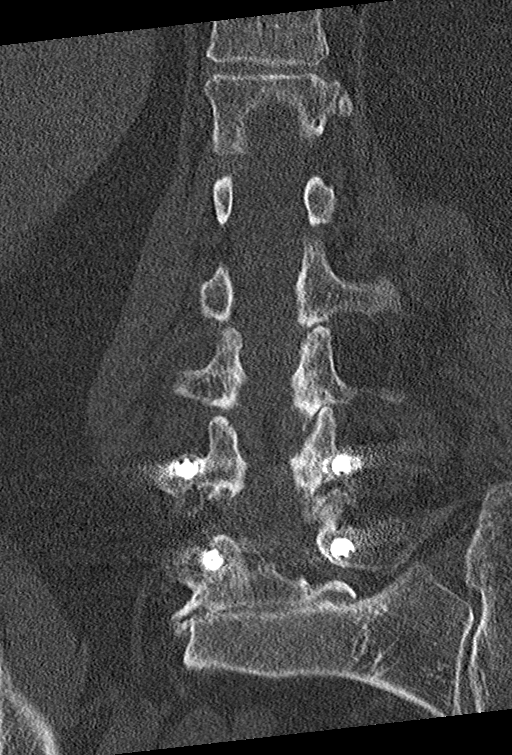
[im 32/54  bone]
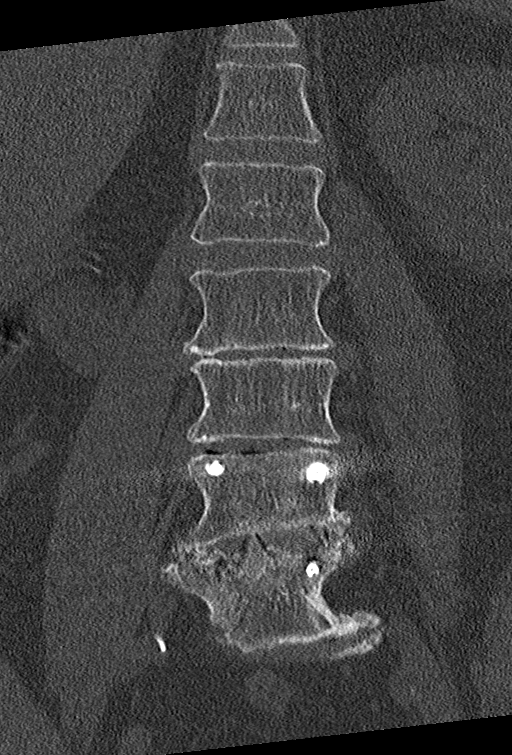

[12 of 35 positions shown; findings below may reference images not displayed]

FINDINGS: Segmentation: The lowest lumbar type non-rib-bearing vertebra is
labeled as L5.

Alignment: 3 mm fused anterolisthesis at L4-5, unchanged. There is 2
mm of degenerative retrolisthesis at L2-3 and L3-4, unchanged.

Vertebrae: Posterolateral rod and pedicle screw fixation at L4-5
bilaterally with interbody spacer and interbody graft material. No
abnormal lucency along the pedicle screws. Stable appearance of mild
scalloping of the superior endplate of L4. Substantial facet
arthropathy at L4-5 and L5-S1 bilaterally. Unfused ossicle versus
old nonunited fracture the tip of the spinous process of L4,
unchanged from prior. No acute lumbar spine fracture is observed.

Paraspinal and other soft tissues: Atherosclerosis is present,
including aortoiliac atherosclerotic disease. Absent right kidney.

Disc levels: The findings at individual intervertebral levels are
similar to [DATE] [DATE].

L1-2: No impingement.  Mild disc bulge.

L2-3: Possible bilateral subarticular lateral recess stenosis and
likely moderate central narrowing of the thecal sac due to diffuse
disc bulge and facet arthropathy.

L3-4: Mild to moderate central narrowing of the thecal sac and mild
left foraminal stenosis due to disc bulge, facet arthropathy, and
short pedicles.

L4-5: Mild bilateral foraminal stenosis due to disc uncovering and
facet arthropathy. Prior left laminectomy.

L5-S1: Mild bilateral foraminal stenosis due to intervertebral and
facet spurring.
IMPRESSION: 1. No acute fracture or acute abnormality observed.
2. Lumbar spondylosis and degenerative disc disease causing moderate
impingement at L2-3, mild to moderate impingement at L3-4, and mild
impingement at L4-5 and L5-S1, as noted above.
3. Prior L4-5 fusion.
4.  Aortic Atherosclerosis (SPLOX-VES.S).

## 2021-11-19 IMAGING — CT CT HEAD W/O CM
3 series · 15 of 47 positions shown, 18 images · non-contrast
Comparison: 09/19/2020 MRI

CLINICAL DATA: Fall, head injury, anticoagulation

EXAM:
CT HEAD WITHOUT CONTRAST
TECHNIQUE: Contiguous axial images were obtained from the base of the skull
through the vertex without intravenous contrast.

[Series 2: head wo · axial · 0.44mm/px · z∈[+339,+464]mm · 9 of 31 slices shown, 12 images]
[im 3/31  brain]
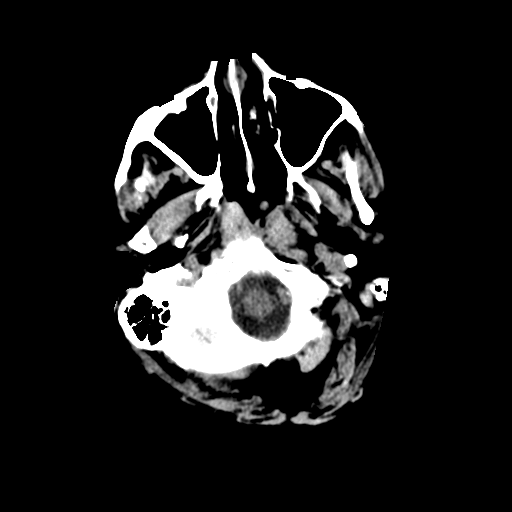
[im 3/31  bone]
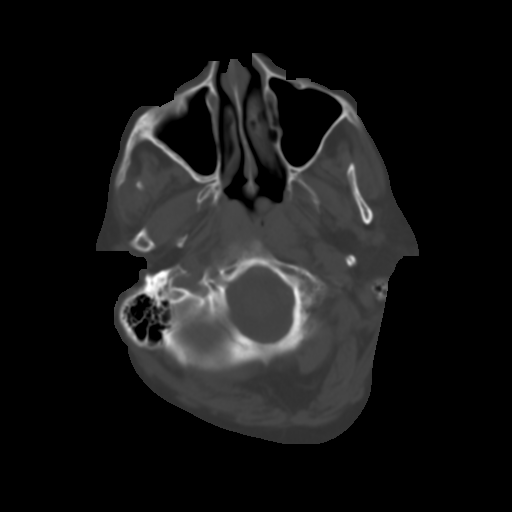
[im 6/31  brain]
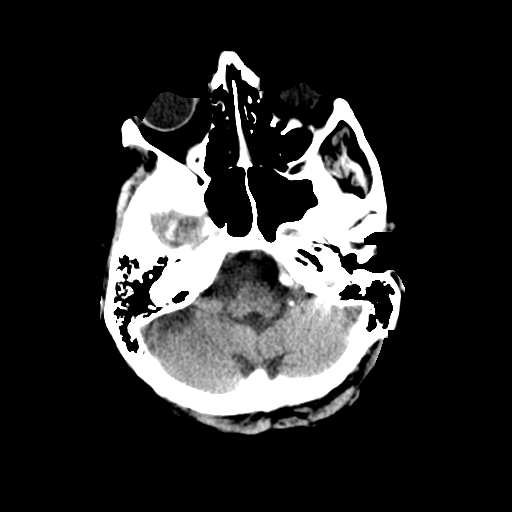
[im 9/31  brain]
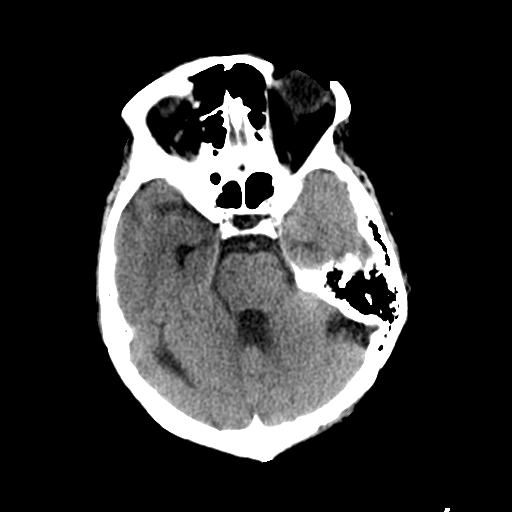
[im 12/31  brain]
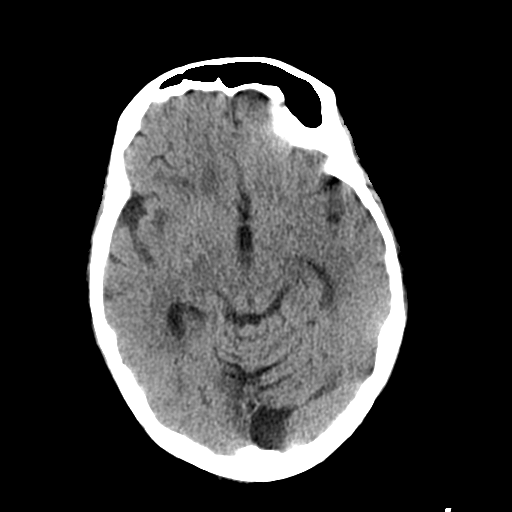
[im 16/31  brain]
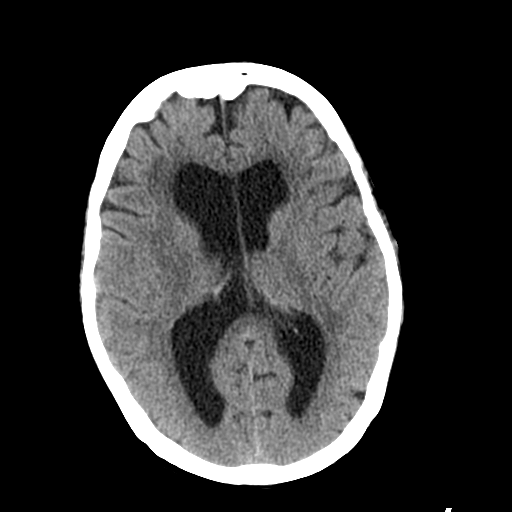
[im 16/31  bone]
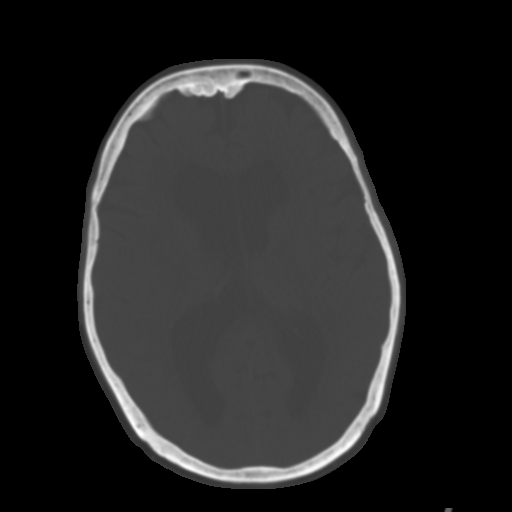
[im 19/31  brain]
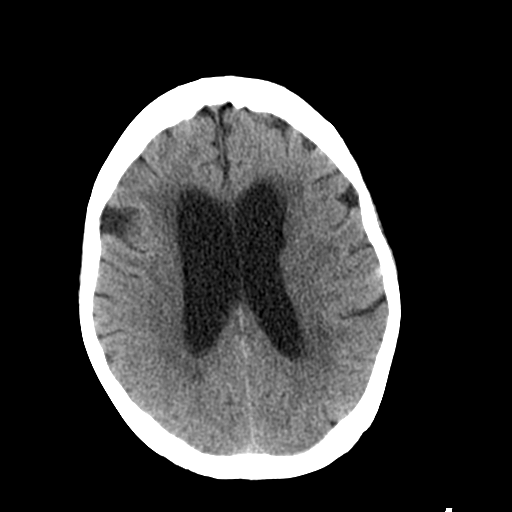
[im 22/31  brain]
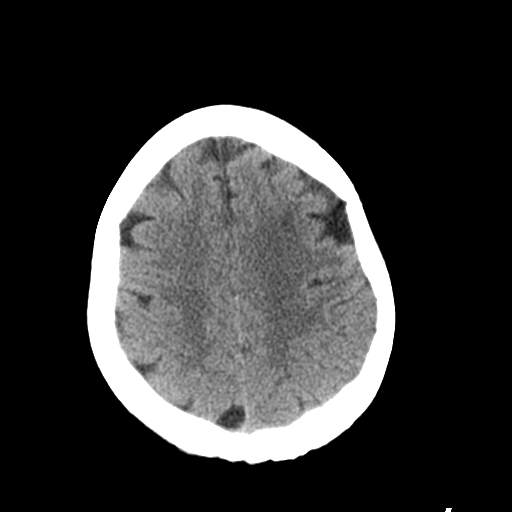
[im 25/31  brain]
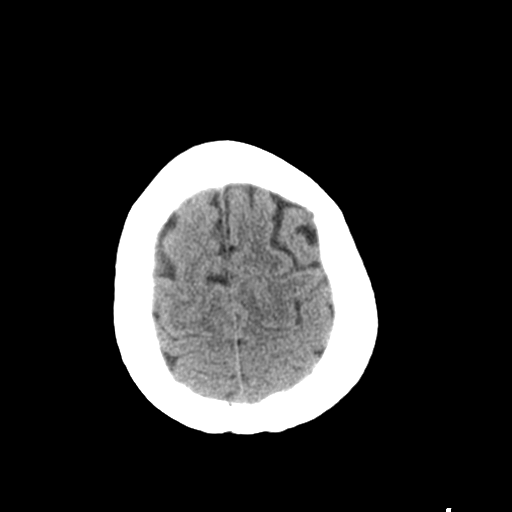
[im 28/31  brain]
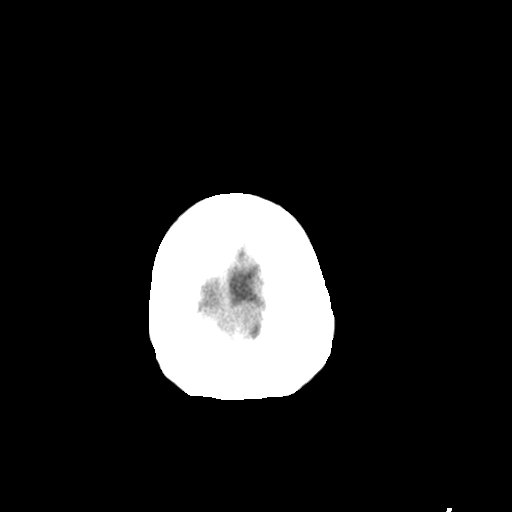
[im 28/31  bone]
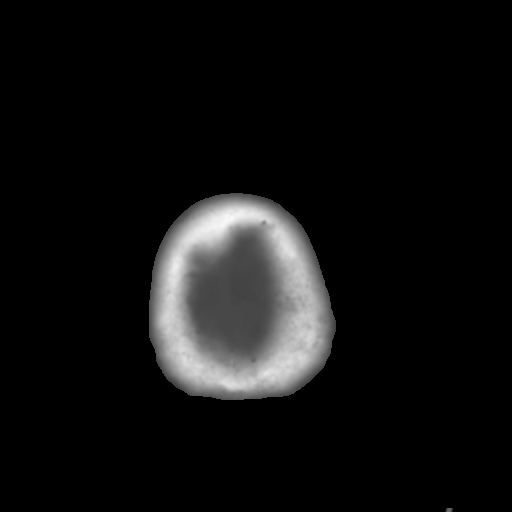

[Series 4: coronal soft tissue · coronal · 0.29mm/px · 3 of 67 slices shown]
[im 23/67  brain]
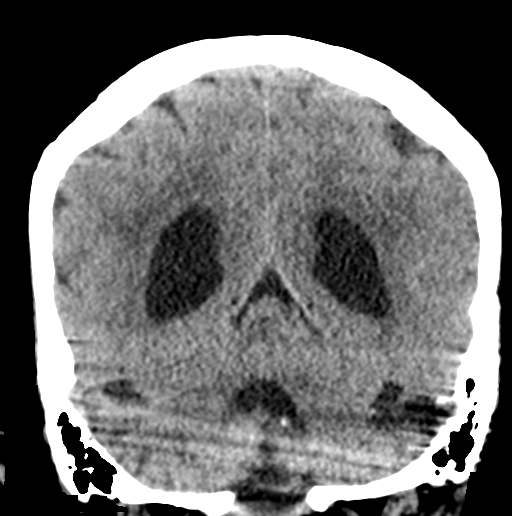
[im 30/67  brain]
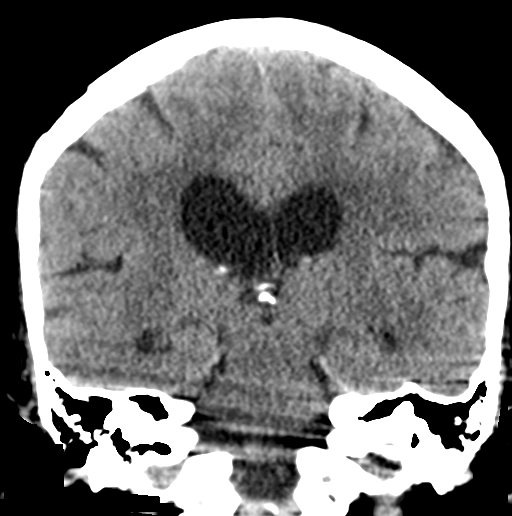
[im 37/67  brain]
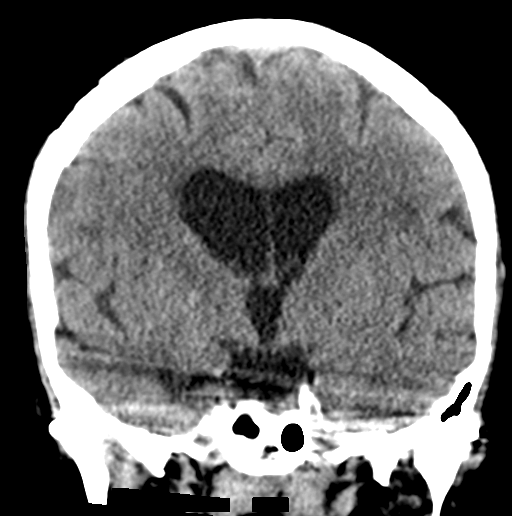

[Series 5: sagittal soft tissue · sagittal · 0.29mm/px · 3 of 48 slices shown]
[im 16/48  brain]
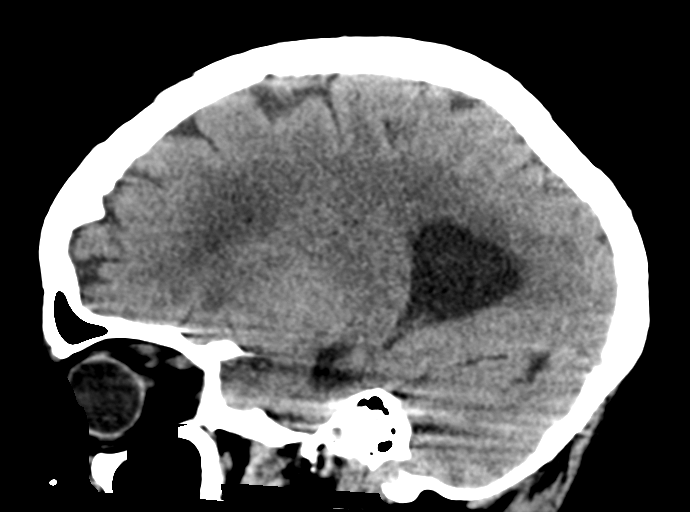
[im 24/48  brain]
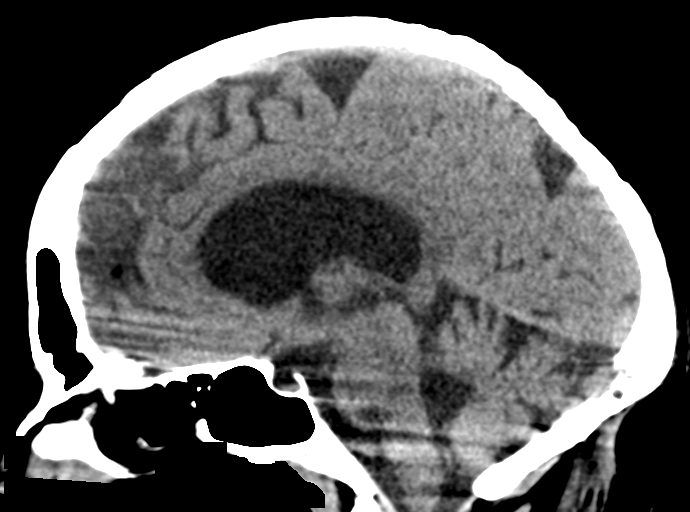
[im 32/48  brain]
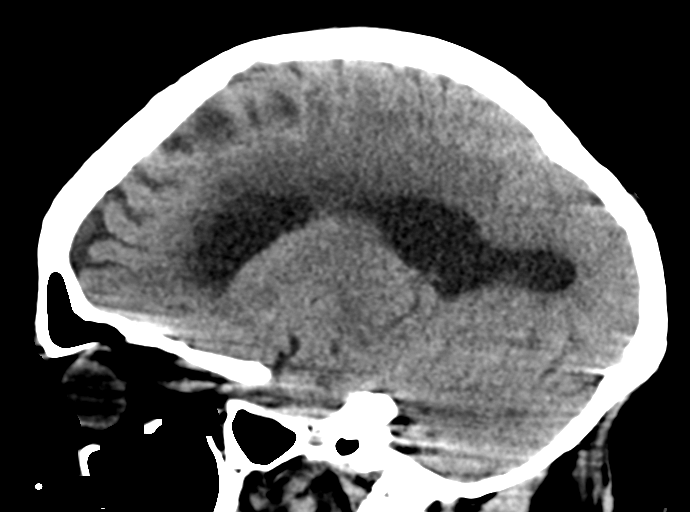

[15 of 47 positions shown; findings below may reference images not displayed]

FINDINGS: Brain: Stable mild prominence of the lateral ventricles which may be
ex vacuo, as noted on prior exams, normal pressure hydrocephalus can
sometimes cause a similar appearance. Periventricular white matter
and corona radiata hypodensities favor chronic ischemic
microvascular white matter disease.

Otherwise, the brainstem, cerebellum, cerebral peduncles, thalamus,
basal ganglia, basilar cisterns, and ventricular system appear
within normal limits. No intracranial hemorrhage, mass lesion, or
acute CVA.

Vascular: There is atherosclerotic calcification of the cavernous
carotid arteries bilaterally.

Skull: Unremarkable

Sinuses/Orbits: Chronic ethmoid and right maxillary sinusitis.

Other: Minimal scalp edema along the right occipital scalp.
IMPRESSION: 1. No acute intracranial findings.
2. Mild scalp edema along the right occipital scalp.
3. Stable prominence the lateral ventricles which may well be left
ex vacuo. As noted on prior exams, normal pressure hydrocephalus can
sometimes cause a similar appearance.
4. Periventricular white matter and corona radiata hypodensities
favor chronic ischemic microvascular white matter disease.
5. Atherosclerosis.
6. Chronic ethmoid and right maxillary sinusitis.

## 2021-11-21 ENCOUNTER — Other Ambulatory Visit: Payer: Self-pay | Admitting: Internal Medicine

## 2021-11-21 ENCOUNTER — Other Ambulatory Visit: Payer: Self-pay | Admitting: Physician Assistant

## 2021-11-21 DIAGNOSIS — N3941 Urge incontinence: Secondary | ICD-10-CM

## 2021-11-21 DIAGNOSIS — H1030 Unspecified acute conjunctivitis, unspecified eye: Secondary | ICD-10-CM

## 2021-11-21 IMAGING — CR DG HIP (WITH OR WITHOUT PELVIS) 2-3V*L*
1 series · 3 of 3 positions shown · non-contrast
Comparison: 10/29/2020

CLINICAL DATA: Pt comes into the ED via EMS from home, pt c/o left
hip pain, had recent fall 2 days ago and was seen here [DATE] for
same. Pain secondary to a fall 2 days ago.

EXAM:
DG HIP (WITH OR WITHOUT PELVIS) 2-3V LEFT

[Series 1: dg hip unilat w or w/o pelvis 2-3 views  · non-contrast · 0.14mm/px · 3 of 3 slices shown]
[im 1/3]
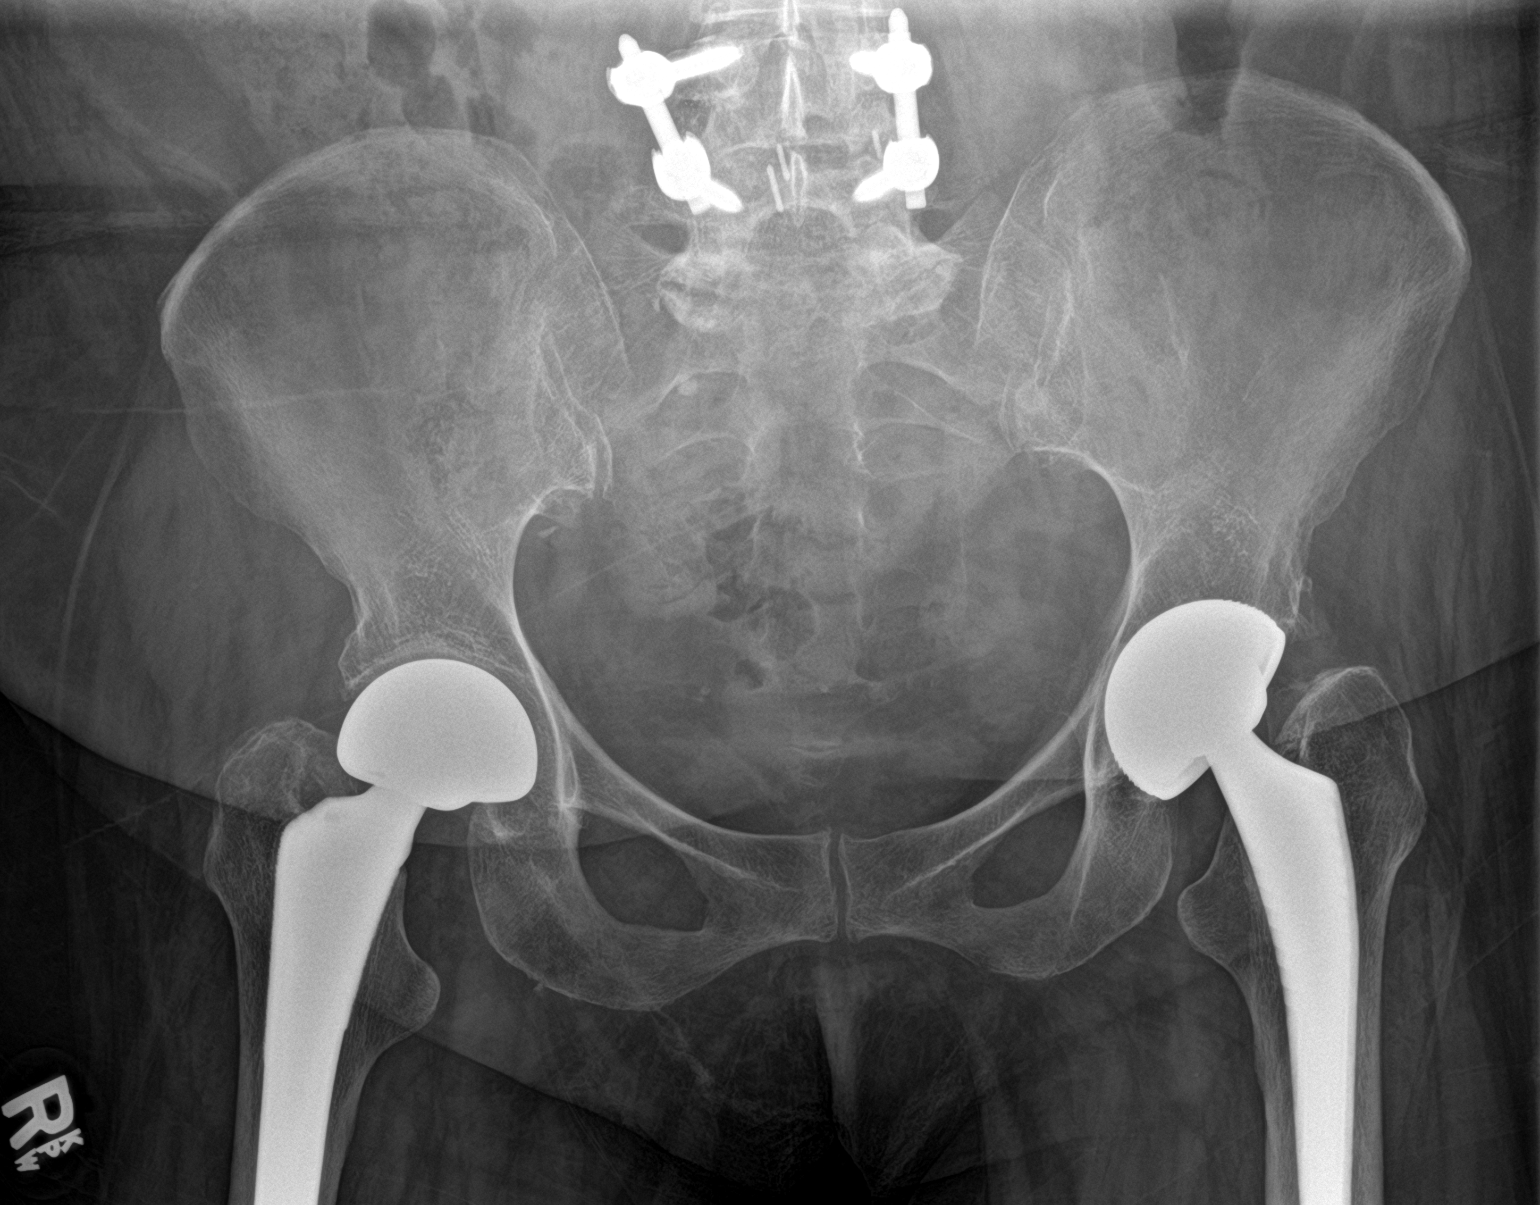
[im 2/3]
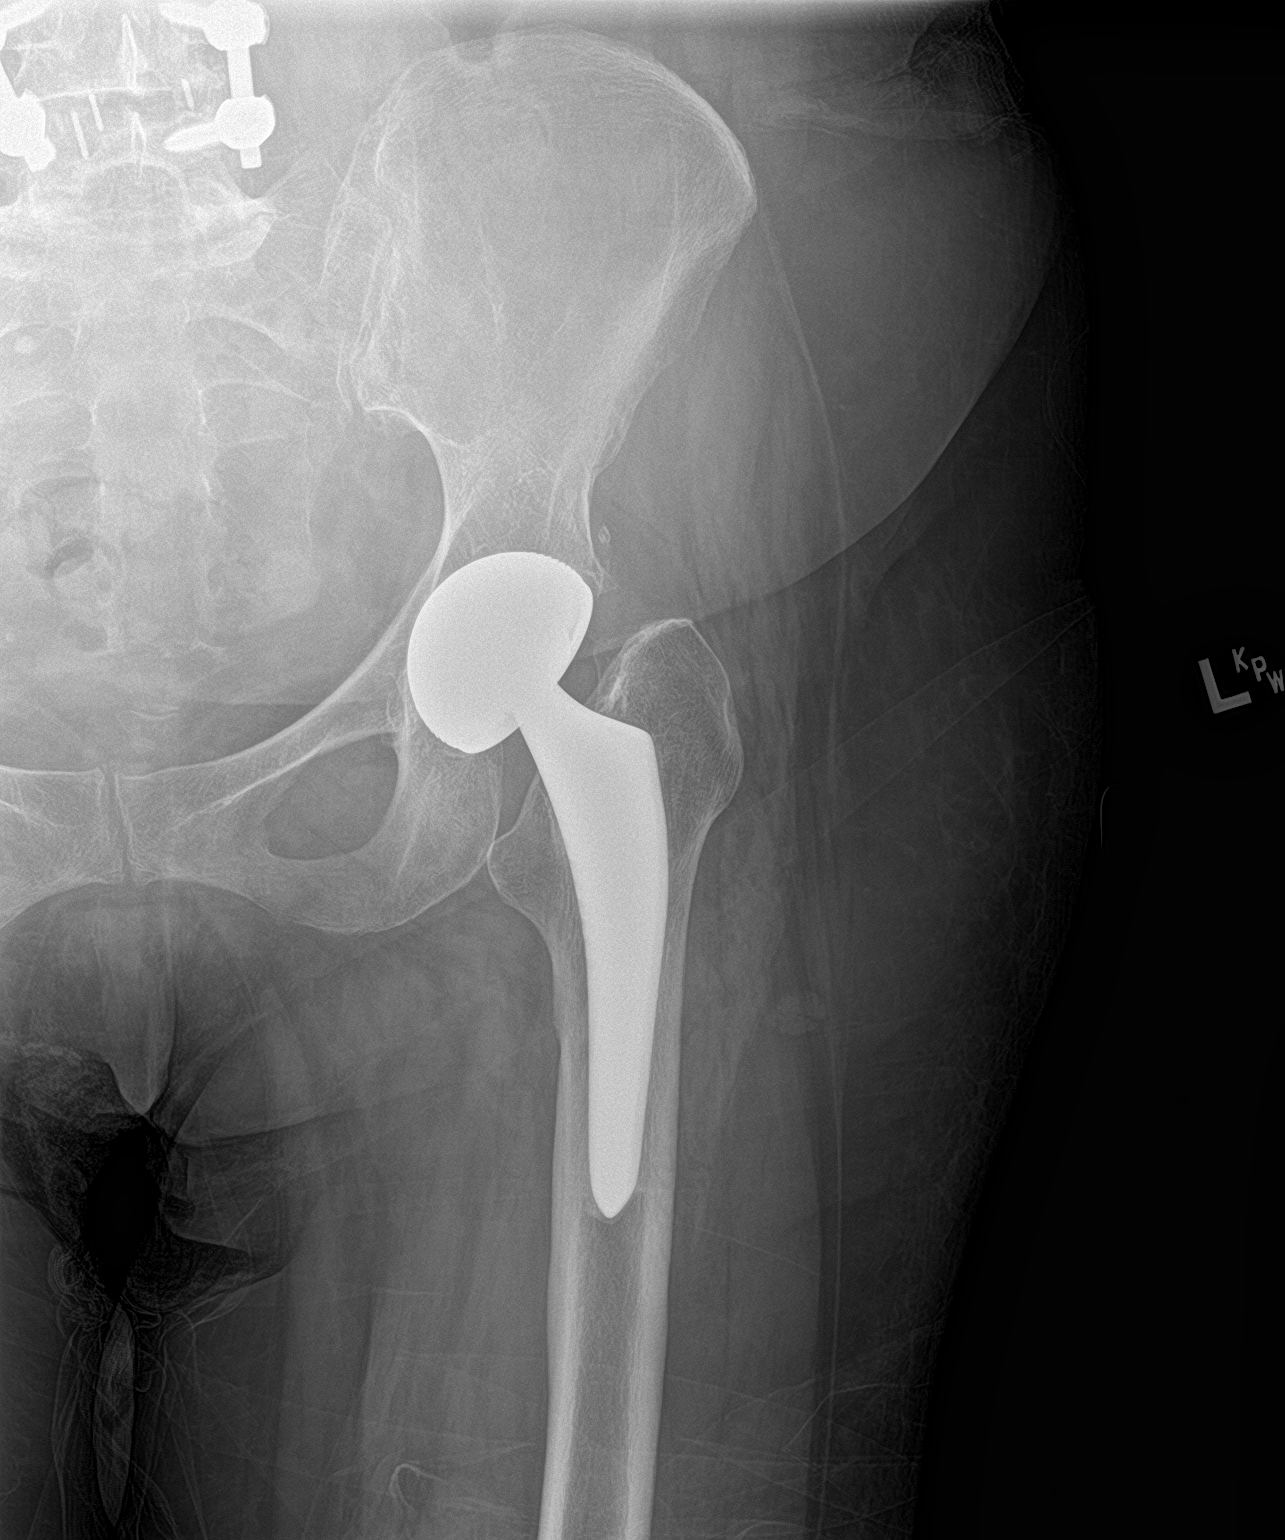
[im 3/3]
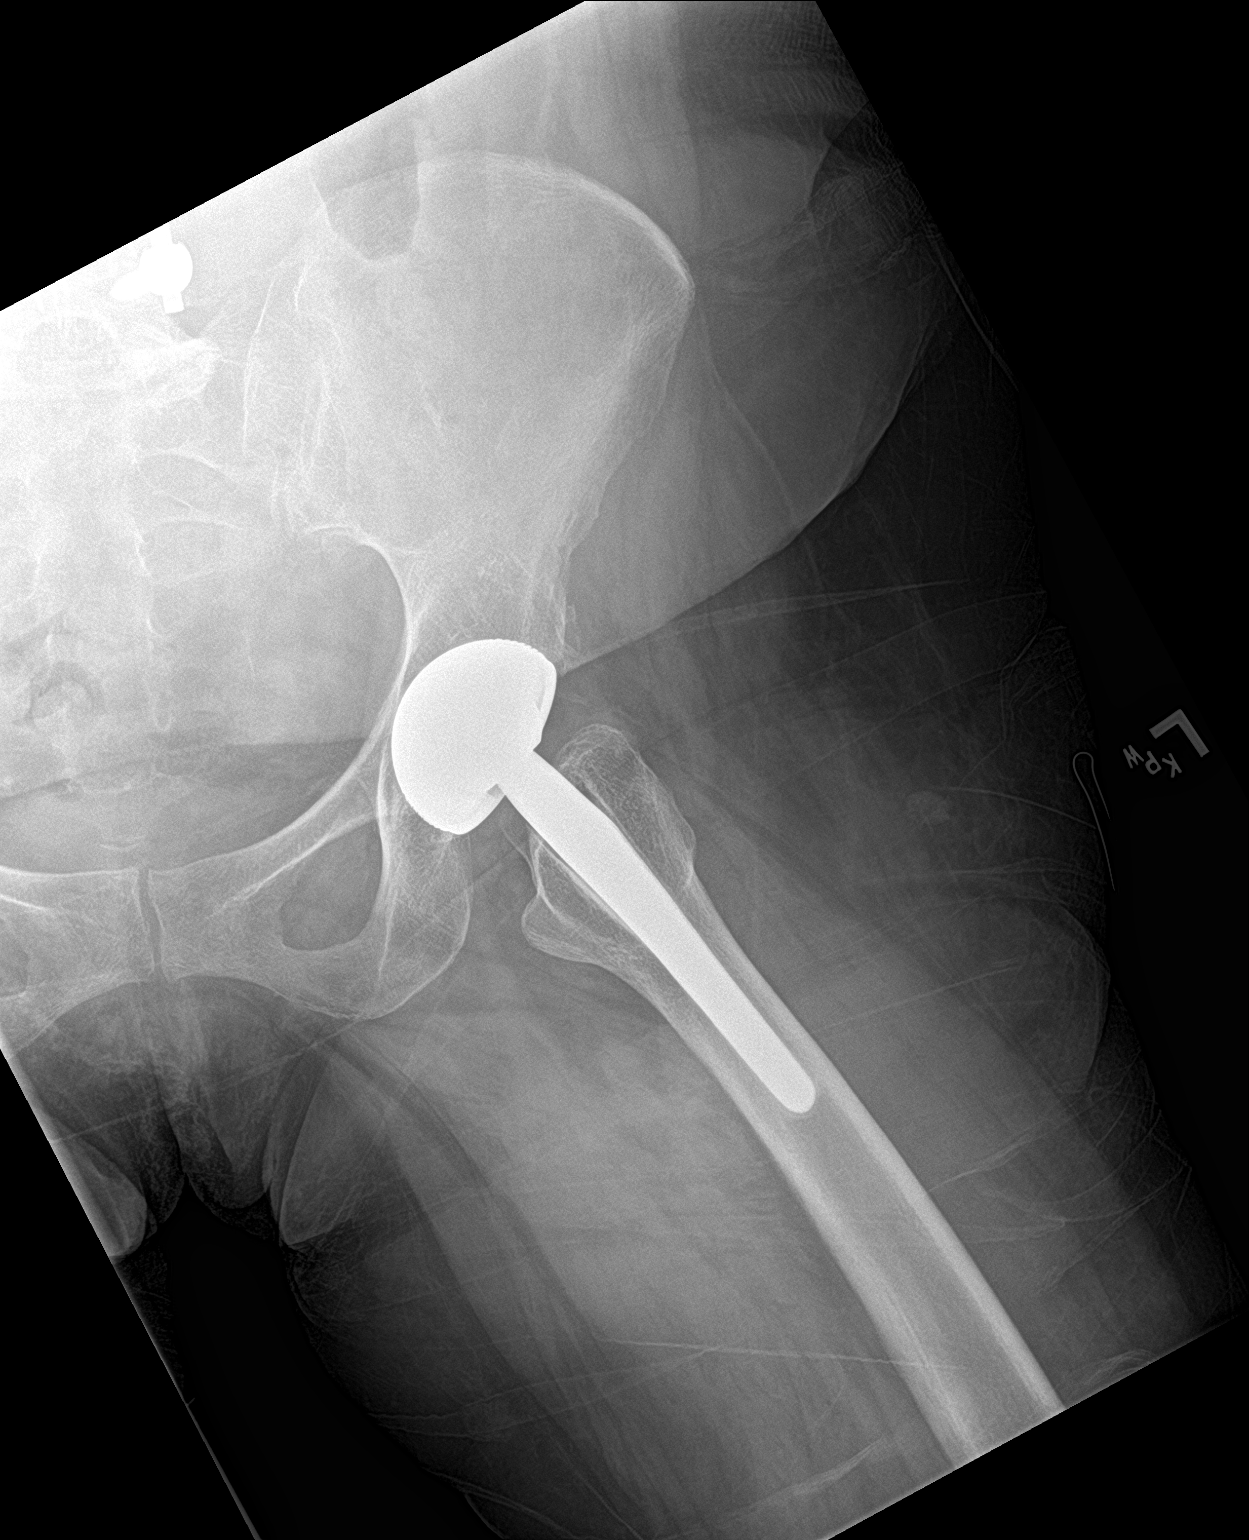

[3 of 3 positions shown; findings below may reference images not displayed]

FINDINGS: Bilateral hip prosthetics. No fracture dislocation within the pelvis
or femurs. Posterior lumbar fusion. Dedicated views of the LEFT hip
demonstrate no fracture or dislocation.
IMPRESSION: No acute osseous abnormality.

## 2021-11-22 DIAGNOSIS — J441 Chronic obstructive pulmonary disease with (acute) exacerbation: Secondary | ICD-10-CM | POA: Diagnosis not present

## 2021-12-01 ENCOUNTER — Encounter (INDEPENDENT_AMBULATORY_CARE_PROVIDER_SITE_OTHER): Payer: Self-pay

## 2021-12-02 ENCOUNTER — Other Ambulatory Visit: Payer: Self-pay | Admitting: Oncology

## 2021-12-03 IMAGING — CR DG CHEST 2V
2 series · 2 of 2 positions shown · non-contrast
Comparison: 11/01/2020 and 10/29/2020

CLINICAL DATA: Pre-op clearance exam

EXAM:
CHEST - 2 VIEW

[chest pa]
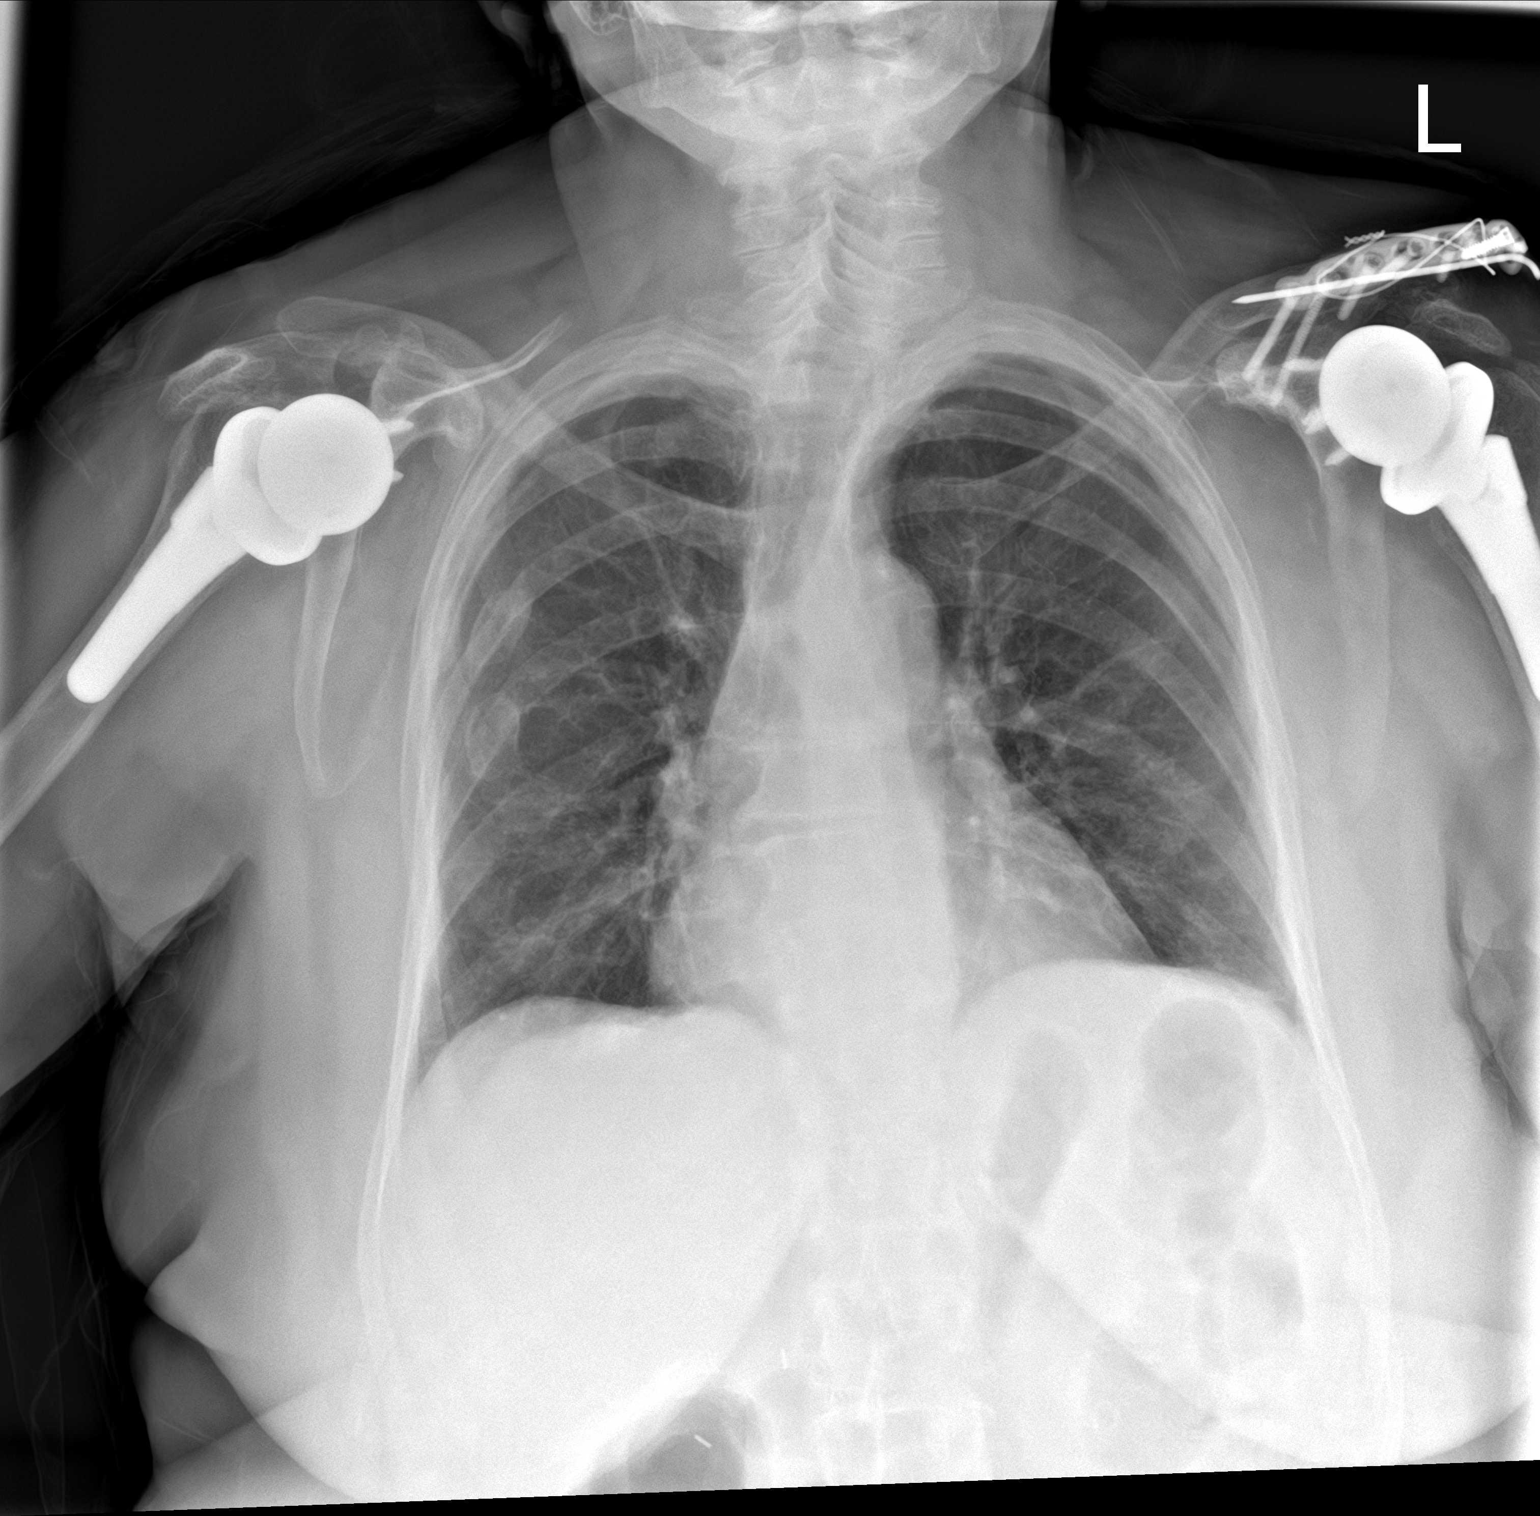

[chest lat]
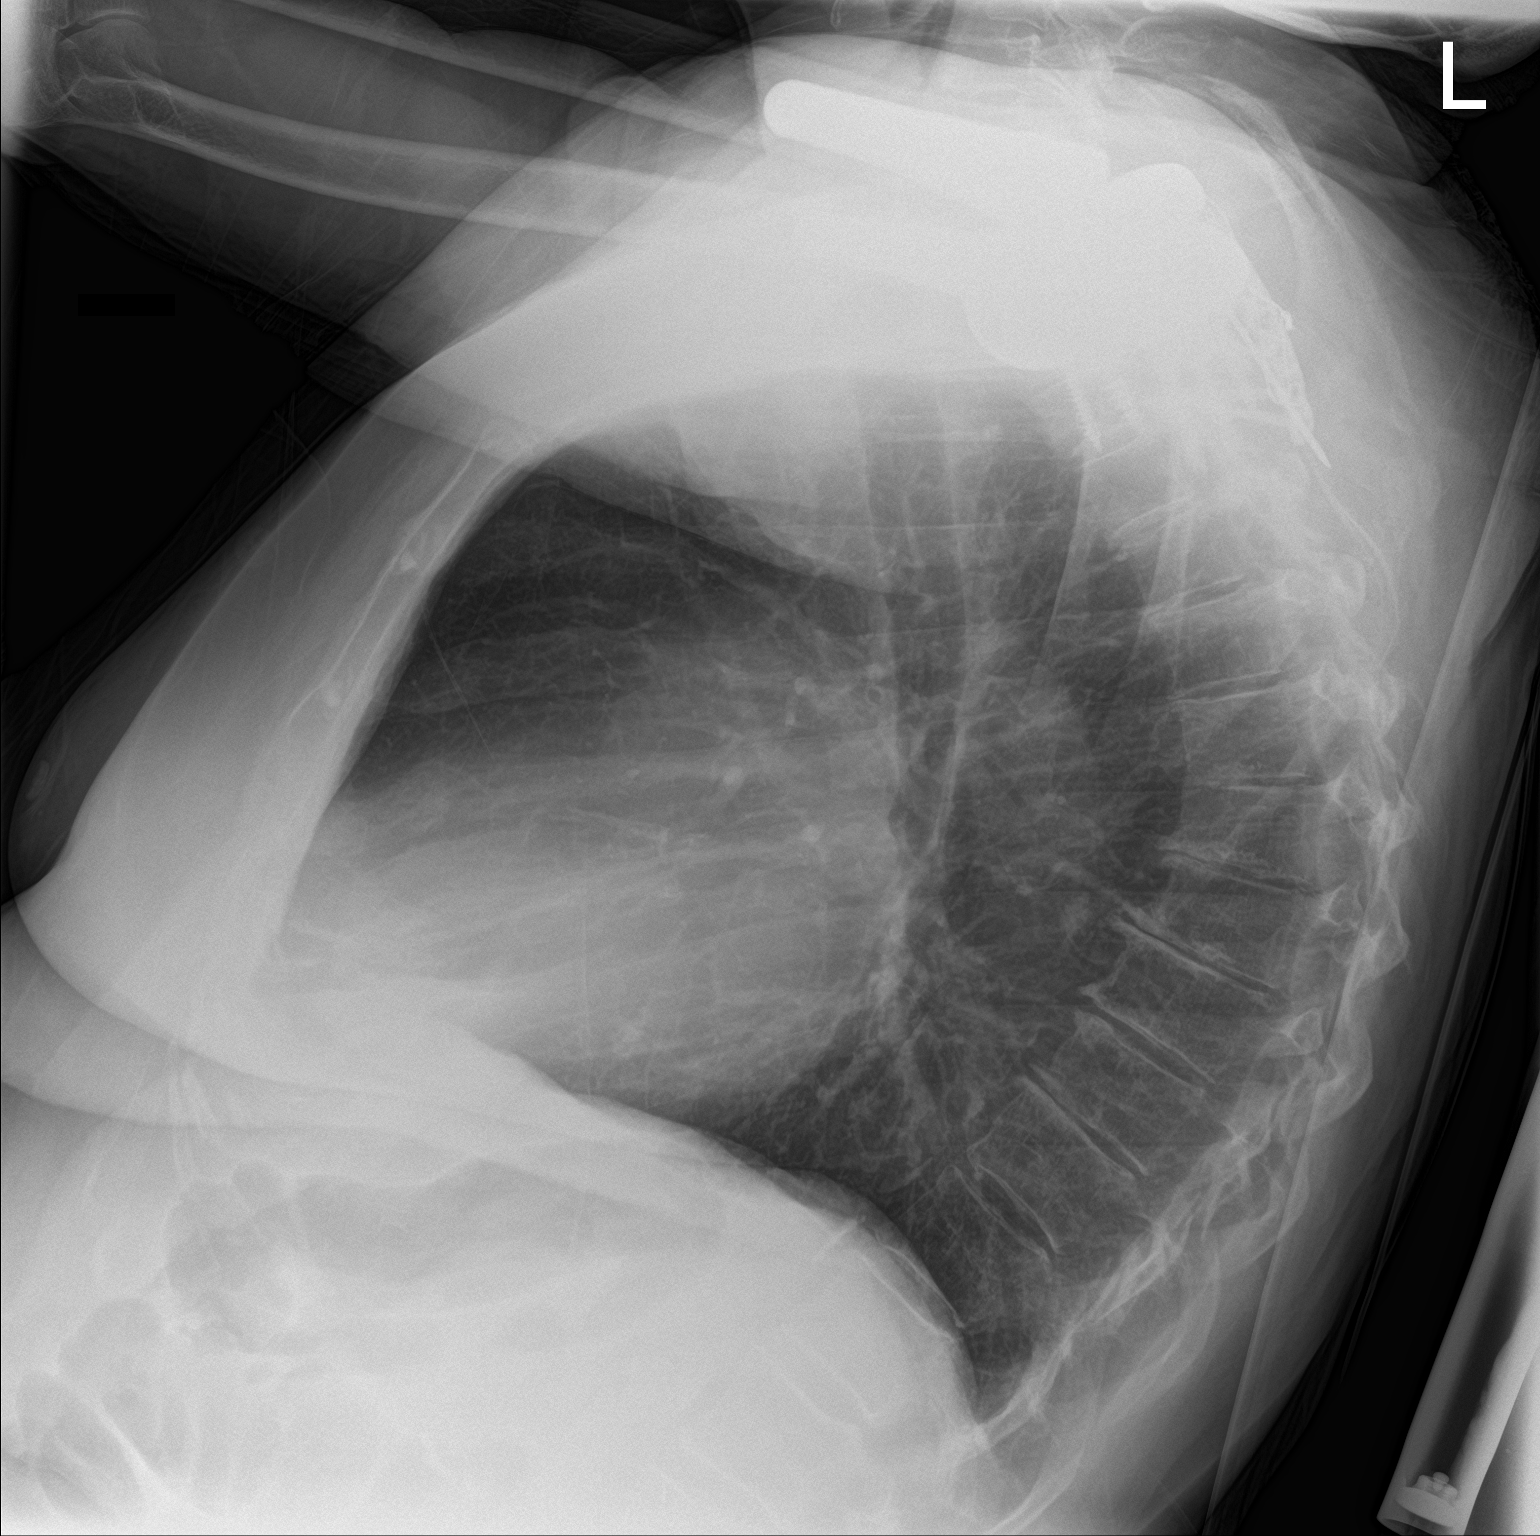

[2 of 2 positions shown; findings below may reference images not displayed]

FINDINGS: The heart size and mediastinal contours are within normal limits.
New linear opacity is seen in the central left upper lobe,
consistent with subsegmental atelectasis. No evidence of pulmonary
infiltrate, edema, or pleural effusion.

Several old right rib fracture deformities are again seen. Bilateral
shoulder prostheses are seen as well as internal fixation plate and
screws in the distal left clavicle and scapula.
IMPRESSION: Mild left upper lobe subsegmental atelectasis. No other acute
findings.

## 2021-12-04 ENCOUNTER — Ambulatory Visit (INDEPENDENT_AMBULATORY_CARE_PROVIDER_SITE_OTHER): Payer: Medicare PPO | Admitting: Physician Assistant

## 2021-12-04 ENCOUNTER — Encounter: Payer: Self-pay | Admitting: Physician Assistant

## 2021-12-04 VITALS — BP 150/74 | HR 84 | Ht 61.5 in | Wt 175.4 lb

## 2021-12-04 DIAGNOSIS — N3941 Urge incontinence: Secondary | ICD-10-CM | POA: Diagnosis not present

## 2021-12-04 LAB — BLADDER SCAN AMB NON-IMAGING

## 2021-12-04 NOTE — Progress Notes (Signed)
12/04/2021 4:39 PM   [name] 1940/05/14 [id number]  CC: Chief Complaint  Patient presents with   urge incontinence   HPI: Kerri Carter is a 81 y.o. female with PMH solitary left kidney, possible left renal mass, and OAB wet on Myrbetriq 50 mg daily who presents today for annual follow-up.   Today she reports her COPD has significantly worsened in the past year requiring multiple hospitalizations.  She was discharged most recently nearly 3 months ago and is very pleased with how well she is doing.  She underwent CT stone study on 02/10/2021, which showed subcentimeter left renal lesions, too small to characterize.  With regard to her urinary symptoms, she feels that she has good days and bad days.  On her bad days, she is still having significant, high-volume urinary leakage with fluid on the floor phenomenon.  She thinks the Myrbetriq is helping, but she is not yet at her treatment goal.  She denies dysuria.  PVR 58m.  PMH: Past Medical History:  Diagnosis Date   Anxiety    Aortic stenosis 07/09/2015   a.) TTE 07/06/2015: EF 55-60%; mild AS with MPG 13.0 mmHg.   Arrhythmia    atrial fibrillation   Asthma    Bipolar disorder ([place]    C. difficile diarrhea 2010   a.) following ABX course during hospital admission   Carotid atherosclerosis, bilateral    a.) Moderate; < 50% stenosis BILATERAL ICAs.   Cataract    a.) s/p BILATERAL extraction   CHF (congestive heart failure) (HCC)    Chicken pox    CKD (chronic kidney disease), stage III (HCC)    a. s/p R nephrectomy./ aneurysm   Conversion disorder    COPD (chronic obstructive pulmonary disease) (HCC)    Depression    Essential hypertension    GERD (gastroesophageal reflux disease)    Heart murmur    Hyperlipidemia    ILD (interstitial lung disease) (HCC)    mild; 2/2 RA diagnosis   Inflammatory arthritis    a. hands/carpal tunnel.  b. Low titer rheumatoid factor. c. Negative anti-CCP antibodies. d. Plaquenil.    Macular degeneration    Nocturnal hypoxemia    Non-Obstructive CAD    a. 07/2009 Cath ([facility]): nonobs dzs;  b. 03/2011 Cath ([facility]: nonobs dzs.   Osteoarthritis    a. Knees.   PAD (peripheral artery disease) (HCC)    PUD (peptic ulcer disease)    S/P right hip fracture    11/01/16 s/p repair   Shoulder pain    Sleep apnea    no cpap / minimal   Spinal stenosis at L4-L5 level    severe with L4/L5 anterolisthesis grade 1 anterolisthesis    Toxic maculopathy    Valvular heart disease    a.) TTE 07/06/2015: EF 55-60%; Mild MR/AR/TR; Mild AS with MPG 13.0 mmHg.    Surgical History: Past Surgical History:  Procedure Laterality Date   APPENDECTOMY     APPLICATION OF INTRAOPERATIVE CT SCAN N/A 05/23/2021   Procedure: APPLICATION OF INTRAOPERATIVE CT SCAN;  Surgeon: YMeade Maw MD;  Location: [facility];  Service: Neurosurgery;  Laterality: N/A;   BACK SURGERY     lumbar fusion   BUNIONECTOMY Right    CATARACT EXTRACTION, BILATERAL     CESAREAN SECTION     x1   CHOLECYSTECTOMY N/A 05/11/2016   Procedure: LAPAROSCOPIC CHOLECYSTECTOMY;  Surgeon: RFlorene Glen MD;  Location: [facility];  Service: General;  Laterality: N/A;   COLONOSCOPY WITH  PROPOFOL N/A 04/02/2016   Procedure: COLONOSCOPY WITH PROPOFOL;  Surgeon: Jonathon Bellows, MD;  Location: [facility];  Service: Endoscopy;  Laterality: N/A;   ENDOSCOPIC RETROGRADE CHOLANGIOPANCREATOGRAPHY (ERCP) WITH PROPOFOL N/A 05/08/2016   Procedure: ENDOSCOPIC RETROGRADE CHOLANGIOPANCREATOGRAPHY (ERCP) WITH PROPOFOL;  Surgeon: Lucilla Lame, MD;  Location: [facility];  Service: Endoscopy;  Laterality: N/A;   ERCP     with biliary spincterotomy 05/08/16 Dr. Allen Norris for choledocholithiasis    ESOPHAGEAL DILATION  04/02/2016   Procedure: ESOPHAGEAL DILATION;  Surgeon: Jonathon Bellows, MD;  Location: [facility];  Service: Endoscopy;;   ESOPHAGOGASTRODUODENOSCOPY (EGD) WITH PROPOFOL N/A 04/02/2016   Procedure: ESOPHAGOGASTRODUODENOSCOPY (EGD) WITH PROPOFOL;   Surgeon: Jonathon Bellows, MD;  Location: [facility];  Service: Endoscopy;  Laterality: N/A;   HIP ARTHROPLASTY Right 11/01/2016   Procedure: ARTHROPLASTY BIPOLAR HIP (HEMIARTHROPLASTY);  Surgeon: Corky Mull, MD;  Location: [facility];  Service: Orthopedics;  Laterality: Right;   LUMBAR LAMINECTOMY/DECOMPRESSION MICRODISCECTOMY N/A 12/11/2020   Procedure: LEFT L2-3 MICRODISCECTOMY, L3-4 AND L5-S1 DECOMPRESSION;  Surgeon: Meade Maw, MD;  Location: [facility];  Service: Neurosurgery;  Laterality: N/A;   NEPHRECTOMY  1988   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   ORIF SCAPHOID FRACTURE Left 12/21/2019   Procedure: OPEN REDUCTION INTERNAL FIXATION (ORIF) OF LEFT SCAPULAR NONUNION WITH BONE GRAFT;  Surgeon: Corky Mull, MD;  Location: [facility];  Service: Orthopedics;  Laterality: Left;   osteoporosis     noted DEXA 08/19/16    REPLACEMENT TOTAL KNEE Right    REVERSE SHOULDER ARTHROPLASTY Right 11/04/2017   Procedure: REVERSE SHOULDER ARTHROPLASTY;  Surgeon: Corky Mull, MD;  Location: [facility];  Service: Orthopedics;  Laterality: Right;   REVERSE SHOULDER ARTHROPLASTY Left 07/26/2018   Procedure: REVERSE SHOULDER ARTHROPLASTY;  Surgeon: Corky Mull, MD;  Location: [facility];  Service: Orthopedics;  Laterality: Left;   TONSILLECTOMY     TOTAL HIP ARTHROPLASTY  12/10/11   [facility] left hip   TOTAL HIP ARTHROPLASTY Bilateral    TRANSFORAMINAL LUMBAR INTERBODY FUSION (TLIF) WITH PEDICLE SCREW FIXATION 3 LEVEL N/A 05/23/2021   Procedure: OPEN L2-4 TRANSFORAMINAL LUMBAR INTERBODY FUSION (TLIF) WITH L2-5 POSTERIOR SPINAL FUSION;  Surgeon: Meade Maw, MD;  Location: [facility];  Service: Neurosurgery;  Laterality: N/A;   TUBAL LIGATION      Home Medications:  Allergies as of 12/04/2021       Reactions   Ceftin [cefuroxime Axetil] Anaphylaxis   Lisinopril Anaphylaxis   Sulfa Antibiotics Other (See Comments)   Face swelling   Sulfasalazine Anaphylaxis   Morphine  Other (See Comments)   Per patient, low blood pressure issues that requires action to raise it back up. Can take small infrequent doses   Xarelto [rivaroxaban] Other (See Comments)   Stomach burning, bleeding, and tar in stool   Adhesive [tape] Rash   Paper tape and tega derm OK   Antihistamines, Chlorpheniramine-type Other (See Comments)   Makes pt hyper   Antivert [meclizine Hcl] Other (See Comments)   Bladder will not empty   Aspirin Other (See Comments)   Sulfasalazine allergy cross reacts   Contrast Media [iodinated Contrast Media] Rash   she is able to use betadine scrubs.   Decongestant [pseudoephedrine Hcl] Other (See Comments)   Makes pt hyper   Doxycycline Other (See Comments)   GI upset   Levaquin [levofloxacin In D5w] Rash   Polymyxin B Other (See Comments)   Facial rash   Tetanus Toxoids Rash, Other (See Comments)  Fever and hot to touch at injection site        Medication List        Accurate as of December 04, 2021  4:39 PM. If you have any questions, ask your nurse or doctor.          STOP taking these medications    etodolac 500 MG tablet Commonly known as: LODINE Stopped by: Debroah Loop, PA-C       TAKE these medications    albuterol 108 (90 Base) MCG/ACT inhaler Commonly known as: Ventolin HFA Inhale 2 puffs into the lungs every 6 (six) hours as needed for wheezing or shortness of breath.   amLODipine 5 MG tablet Commonly known as: NORVASC Take 1 tablet (5 mg total) by mouth daily.   apixaban 5 MG Tabs tablet Commonly known as: ELIQUIS Take 1 tablet (5 mg total) by mouth 2 (two) times daily.   budesonide 0.25 MG/2ML nebulizer solution Commonly known as: PULMICORT USE 1 VIAL  IN  NEBULIZER TWICE  DAILY - rinse mouth after treatment   busPIRone 5 MG tablet Commonly known as: BUSPAR Take 1 tablet (5 mg total) by mouth 2 (two) times daily.   dicyclomine 10 MG capsule Commonly known as: BENTYL TAKE 1 CAPSULE (10 MG TOTAL)  BY MOUTH 2 (TWO) TIMES DAILY AS NEEDED FOR SPASMS.   escitalopram 10 MG tablet Commonly known as: LEXAPRO Take 1 tablet (10 mg total) by mouth daily.   famotidine 20 MG tablet Commonly known as: PEPCID TAKE 1 TABLET (20 MG TOTAL) BY MOUTH DAILY. BEFORE BREAKFAST OR DINNER   ferrous sulfate 325 (65 FE) MG EC tablet Take 1 tablet (325 mg total) by mouth 2 (two) times daily. What changed: additional instructions   fluticasone 50 MCG/ACT nasal spray Commonly known as: Flonase Place 2 sprays into both nostrils daily. In am   gabapentin 300 MG capsule Commonly known as: NEURONTIN TAKE 2 CAPSULES (600 MG TOTAL) BY MOUTH IN THE MORNING AND AT BEDTIME.   ipratropium-albuterol 0.5-2.5 (3) MG/3ML Soln Commonly known as: DUONEB Take 3 mLs by nebulization every 6 (six) hours as needed.   lamoTRIgine 100 MG tablet Commonly known as: LAMICTAL TAKE 1 TAB 2 TIMES DAILY. FURTHER REFILLS NEW PSYCH FOR ALL PSYCH MEDS ONLY TEMP SUPPLY FROM PCP   leflunomide 20 MG tablet Commonly known as: ARAVA Take 1 tablet (20 mg total) by mouth daily.   lidocaine 5 % Commonly known as: Lidoderm Place 1 patch onto the skin 2 (two) times daily as needed. Remove & Discard patch within 12 hours or as directed by MD What changed: when to take this   lovastatin 20 MG tablet Commonly known as: MEVACOR TAKE 1 TABLET BY MOUTH EVERYDAY AT BEDTIME *STOP TALKING '40MG'$ * What changed: See the new instructions.   methocarbamol 500 MG tablet Commonly known as: ROBAXIN Take 500 mg by mouth 4 (four) times daily as needed. Taking twice a day currently What changed: Another medication with the same name was removed. Continue taking this medication, and follow the directions you see here. Changed by: Debroah Loop, PA-C   methylPREDNISolone 4 MG Tbpk tablet Commonly known as: MEDROL DOSEPAK Take 5 mg by mouth.   montelukast 10 MG tablet Commonly known as: SINGULAIR TAKE 1 TABLET BY MOUTH EVERY DAY    multivitamin-lutein Caps capsule Take 1 capsule by mouth at bedtime.   Myrbetriq 50 MG Tb24 tablet Generic drug: mirabegron ER TAKE 1 TABLET BY MOUTH EVERY DAY   nebivolol 5 MG  tablet Commonly known as: Bystolic Take 1 tablet (5 mg total) by mouth in the morning and at bedtime. (Note dose changed from 1/2 10 mg bid to 5 mg bid)   OXYGEN Inhale 2 L into the lungs at bedtime.   pantoprazole 40 MG tablet Commonly known as: PROTONIX TAKE 1 TABLET BY MOUTH 2 TIMES DAILY 30 MIN BEFORE FOOD (NOTE REDUCTION IN FREQUENCY)   QUEtiapine 25 MG tablet Commonly known as: SEROQUEL TAKE 1 TABLET (25 MG TOTAL) BY MOUTH AT BEDTIME. AGAIN LAST FILL FURTHER REFILLS FROM PSYCHIATRY NO EXCEPTIONS   sucralfate 1 g tablet Commonly known as: CARAFATE TAKE 1 TABLET BY MOUTH 2 TIMES DAILY.   torsemide 20 MG tablet Commonly known as: DEMADEX Take 1 tablet (20 mg total) by mouth daily.   Vitamin D3 50 MCG (2000 UT) Tabs Take 4,000 Units by mouth daily.   zoledronic acid 5 MG/100ML Soln injection Commonly known as: RECLAST Inject 5 mg into the vein once.        Allergies:  Allergies  Allergen Reactions   Ceftin [Cefuroxime Axetil] Anaphylaxis   Lisinopril Anaphylaxis   Sulfa Antibiotics Other (See Comments)    Face swelling   Sulfasalazine Anaphylaxis   Morphine Other (See Comments)    Per patient, low blood pressure issues that requires action to raise it back up. Can take small infrequent doses   Xarelto [Rivaroxaban] Other (See Comments)    Stomach burning, bleeding, and tar in stool   Adhesive [Tape] Rash    Paper tape and tega derm OK   Antihistamines, Chlorpheniramine-Type Other (See Comments)    Makes pt hyper   Antivert [Meclizine Hcl] Other (See Comments)    Bladder will not empty   Aspirin Other (See Comments)    Sulfasalazine allergy cross reacts   Contrast Media [Iodinated Contrast Media] Rash    she is able to use betadine scrubs.   Decongestant [Pseudoephedrine Hcl]  Other (See Comments)    Makes pt hyper   Doxycycline Other (See Comments)    GI upset   Levaquin [Levofloxacin In D5w] Rash   Polymyxin B Other (See Comments)    Facial rash   Tetanus Toxoids Rash and Other (See Comments)    Fever and hot to touch at injection site    Family History: Family History  Problem Relation Age of Onset   Rheum arthritis Mother    Asthma Mother    Parkinson's disease Mother    Heart disease Mother    Stroke Mother    Hypertension Mother    Heart attack Father    Heart disease Father    Hypertension Father    Peripheral Artery Disease Father    Diabetes Son    Gout Son    Asthma Sister    Heart disease Sister    Lung cancer Sister    Heart disease Sister    Heart disease Sister    Breast cancer Sister    Heart attack Sister    Heart disease Brother    Heart disease Maternal Grandmother    Diabetes Maternal Grandmother    Colon cancer Maternal Grandmother    Cancer Maternal Grandmother        Hodgkins lymphoma   Heart disease Brother    Alcohol abuse Brother    Depression Brother    Dementia Son     Social History:   reports that she quit smoking about 47 years ago. Her smoking use included cigarettes. She has a 10.00 pack-year smoking  history. She has never used smokeless tobacco. She reports that she does not drink alcohol and does not use drugs.  Physical Exam: BP (!) 150/74   Pulse 84   Ht 5' 1.5" (1.562 m)   Wt 175 lb 6.4 oz (79.6 kg)   BMI 32.60 kg/m   Constitutional:  Alert and oriented, no acute distress, nontoxic appearing HEENT: [place], AT Cardiovascular: No clubbing, cyanosis, or edema Respiratory: Normal respiratory effort, no increased work of breathing Skin: No rashes, bruises or suspicious lesions Neurologic: Grossly intact, no focal deficits, moving all 4 extremities Psychiatric: Normal mood and affect  Laboratory Data: Results for orders placed or performed in visit on 12/04/21  Bladder Scan (Post Void Residual) in  office  Result Value Ref Range   Scan Result 60ML    Assessment & Plan:   1. Urge incontinence Symptoms improved on Myrbetriq 50 mg, however she is not at her treatment goal.  We discussed various options including a trial of Gemtesa as an alternative pharmacotherapy as well as third line OAB therapies including intravesical Botox, InterStim, and PTNS.  She is most interested in pursuing Gemtesa and PTNS at this point.  She does not desire to pursue additional elective surgeries at this point given her multiple hospitalizations this year, which is reasonable.  She recently refilled a 90-day supply of Myrbetriq, so I gave her 4 weeks of Gemtesa to start at her convenience.  I asked her to contact our clinic after about 3 weeks of Gemtesa samples to schedule a follow-up with me.  At that point, may consider continuing Gemtesa with or without starting PTNS based on her symptomatic improvement.  She is in agreement with this plan. - Bladder Scan (Post Void Residual) in office  Return for 3 weeks after startig Gemteza samples.  Debroah Loop, PA-C  [facility] [address], [place],  [address] [phone]

## 2021-12-09 ENCOUNTER — Inpatient Hospital Stay: Payer: Medicare PPO | Attending: Oncology

## 2021-12-09 ENCOUNTER — Inpatient Hospital Stay (HOSPITAL_BASED_OUTPATIENT_CLINIC_OR_DEPARTMENT_OTHER): Payer: Medicare PPO | Admitting: Oncology

## 2021-12-09 ENCOUNTER — Encounter: Payer: Self-pay | Admitting: Oncology

## 2021-12-09 ENCOUNTER — Ambulatory Visit: Payer: Medicare PPO | Admitting: Gastroenterology

## 2021-12-09 ENCOUNTER — Inpatient Hospital Stay: Payer: Medicare PPO

## 2021-12-09 VITALS — BP 135/80 | HR 83 | Temp 98.2°F | Wt 175.0 lb

## 2021-12-09 DIAGNOSIS — D509 Iron deficiency anemia, unspecified: Secondary | ICD-10-CM | POA: Insufficient documentation

## 2021-12-09 DIAGNOSIS — D72828 Other elevated white blood cell count: Secondary | ICD-10-CM

## 2021-12-09 DIAGNOSIS — Z7901 Long term (current) use of anticoagulants: Secondary | ICD-10-CM | POA: Diagnosis not present

## 2021-12-09 DIAGNOSIS — Z79899 Other long term (current) drug therapy: Secondary | ICD-10-CM | POA: Insufficient documentation

## 2021-12-09 DIAGNOSIS — D72829 Elevated white blood cell count, unspecified: Secondary | ICD-10-CM | POA: Diagnosis not present

## 2021-12-09 DIAGNOSIS — D500 Iron deficiency anemia secondary to blood loss (chronic): Secondary | ICD-10-CM | POA: Diagnosis not present

## 2021-12-09 DIAGNOSIS — D631 Anemia in chronic kidney disease: Secondary | ICD-10-CM | POA: Insufficient documentation

## 2021-12-09 DIAGNOSIS — Z87891 Personal history of nicotine dependence: Secondary | ICD-10-CM | POA: Insufficient documentation

## 2021-12-09 DIAGNOSIS — N1831 Chronic kidney disease, stage 3a: Secondary | ICD-10-CM | POA: Diagnosis not present

## 2021-12-09 LAB — CBC WITH DIFFERENTIAL/PLATELET
Abs Immature Granulocytes: 0.19 10*3/uL — ABNORMAL HIGH (ref 0.00–0.07)
Basophils Absolute: 0.1 10*3/uL (ref 0.0–0.1)
Basophils Relative: 1 %
Eosinophils Absolute: 0.7 10*3/uL — ABNORMAL HIGH (ref 0.0–0.5)
Eosinophils Relative: 4 %
HCT: 39.7 % (ref 36.0–46.0)
Hemoglobin: 12.4 g/dL (ref 12.0–15.0)
Immature Granulocytes: 1 %
Lymphocytes Relative: 15 %
Lymphs Abs: 2.3 10*3/uL (ref 0.7–4.0)
MCH: 27.2 pg (ref 26.0–34.0)
MCHC: 31.2 g/dL (ref 30.0–36.0)
MCV: 87.1 fL (ref 80.0–100.0)
Monocytes Absolute: 0.8 10*3/uL (ref 0.1–1.0)
Monocytes Relative: 5 %
Neutro Abs: 11.4 10*3/uL — ABNORMAL HIGH (ref 1.7–7.7)
Neutrophils Relative %: 74 %
Platelets: 248 10*3/uL (ref 150–400)
RBC: 4.56 MIL/uL (ref 3.87–5.11)
RDW: 17.9 % — ABNORMAL HIGH (ref 11.5–15.5)
WBC: 15.4 10*3/uL — ABNORMAL HIGH (ref 4.0–10.5)
nRBC: 0 % (ref 0.0–0.2)

## 2021-12-09 LAB — COMPREHENSIVE METABOLIC PANEL
ALT: 18 U/L (ref 0–44)
AST: 32 U/L (ref 15–41)
Albumin: 3.9 g/dL (ref 3.5–5.0)
Alkaline Phosphatase: 84 U/L (ref 38–126)
Anion gap: 14 (ref 5–15)
BUN: 27 mg/dL — ABNORMAL HIGH (ref 8–23)
CO2: 29 mmol/L (ref 22–32)
Calcium: 9.1 mg/dL (ref 8.9–10.3)
Chloride: 96 mmol/L — ABNORMAL LOW (ref 98–111)
Creatinine, Ser: 1.5 mg/dL — ABNORMAL HIGH (ref 0.44–1.00)
GFR, Estimated: 35 mL/min — ABNORMAL LOW (ref >60)
Glucose, Bld: 186 mg/dL — ABNORMAL HIGH (ref 70–99)
Potassium: 3.4 mmol/L — ABNORMAL LOW (ref 3.5–5.1)
Sodium: 139 mmol/L (ref 135–145)
Total Bilirubin: 0.5 mg/dL (ref 0.3–1.2)
Total Protein: 7.3 g/dL (ref 6.5–8.1)

## 2021-12-09 LAB — FERRITIN: Ferritin: 68 ng/mL (ref 11–307)

## 2021-12-09 LAB — IRON AND TIBC
Iron: 76 ug/dL (ref 28–170)
Saturation Ratios: 22 % (ref 10.4–31.8)
TIBC: 350 ug/dL (ref 250–450)
UIBC: 274 ug/dL

## 2021-12-09 LAB — RETIC PANEL
Immature Retic Fract: 17 % — ABNORMAL HIGH (ref 2.3–15.9)
RBC.: 4.62 MIL/uL (ref 3.87–5.11)
Retic Count, Absolute: 86.4 10*3/uL (ref 19.0–186.0)
Retic Ct Pct: 1.9 % (ref 0.4–3.1)
Reticulocyte Hemoglobin: 30.8 pg (ref >27.9)

## 2021-12-09 MED FILL — Iron Sucrose Inj 20 MG/ML (Fe Equiv): INTRAVENOUS | Qty: 10 | Status: AC

## 2021-12-09 NOTE — Assessment & Plan Note (Signed)
Anemia due to chronic kidney disease. Labs were reviewed and discussed with patient. Hemoglobin is normal currently.  Continue observation.

## 2021-12-09 NOTE — Assessment & Plan Note (Signed)
Labs reviewed and discussed with patient. Iron panel is stable. Recommend patient to continue oral ferrous sulfate 325 mg twice daily.

## 2021-12-09 NOTE — Progress Notes (Signed)
Hematology/Oncology Consult note [facility] [phone] [phone]   Patient Care Team: McLean-Scocuzza, Nino Glow, MD as PCP - General (Internal Medicine) Dannielle Karvonen, RN as [place] Management  ASSESSMENT & PLAN:   Iron deficiency anemia due to chronic blood loss Labs reviewed and discussed with patient. Iron panel is stable. Recommend patient to continue oral ferrous sulfate 325 mg twice daily.  Anemia in stage 3a chronic kidney disease ([place]) Anemia due to chronic kidney disease. Labs were reviewed and discussed with patient. Hemoglobin is normal currently.  Continue observation.  Leukocytosis Patient has had previous work-up done.  Chronic leukocytosis was felt to be secondary to steroid use, autoimmune disease.  Orders Placed This Encounter  Procedures   CBC with Differential/Platelet    Standing Status:   Future    Standing Expiration Date:   12/10/2022   Ferritin    Standing Status:   Future    Standing Expiration Date:   12/10/2022   Iron and TIBC    Standing Status:   Future    Standing Expiration Date:   12/10/2022   Follow-up in 3 months. All questions were answered. The patient knows to call the clinic with any problems, questions or concerns.  Earlie Server, MD, PhD [facility] Hematology Oncology 12/09/2021   CHIEF COMPLAINTS/REASON FOR VISIT:  Follow-up for chronic leukocytosis, iron deficiency anemia,  HISTORY OF PRESENTING ILLNESS:  Kerri Carter is a  81 y.o.  female presents for follow-up of leukocytosis, iron deficiency anemia. Patient was last seen by Beckey Rutter 2 months ago.  Recent admission in August 2023 due to COPD exacerbation.  Patient was found to have severe anemia during her admission received 1 unit of PRBC transfusion.  Patient follows up with gastroenterology Dr. Vicente Males who feels that any endoscopy procedure is of high risk due to patient's respiratory status. Patient takes  oral iron supplementation.  She is also on Eliquis for anticoagulation. Today patient reports feeling well.  No new complaints.  Denies any blood in the stool  Review of Systems  Constitutional:  Positive for fatigue. Negative for appetite change, chills, fever and unexpected weight change.  HENT:   Negative for hearing loss and voice change.   Eyes:  Negative for eye problems.  Respiratory:  Negative for chest tightness and cough.   Cardiovascular:  Positive for leg swelling. Negative for chest pain.  Gastrointestinal:  Negative for abdominal distention, abdominal pain and blood in stool.  Endocrine: Negative for hot flashes.  Genitourinary:  Negative for difficulty urinating and frequency.   Musculoskeletal:  Positive for arthralgias.  Skin:  Negative for itching and rash.  Neurological:  Negative for extremity weakness.  Hematological:  Negative for adenopathy.  Psychiatric/Behavioral:  Negative for confusion.      MEDICAL HISTORY:  Past Medical History:  Diagnosis Date   Anxiety    Aortic stenosis 07/09/2015   a.) TTE 07/06/2015: EF 55-60%; mild AS with MPG 13.0 mmHg.   Arrhythmia    atrial fibrillation   Asthma    Bipolar disorder ([place])    C. difficile diarrhea 2010   a.) following ABX course during hospital admission   Carotid atherosclerosis, bilateral    a.) Moderate; < 50% stenosis BILATERAL ICAs.   Cataract    a.) s/p BILATERAL extraction   CHF (congestive heart failure) (HCC)    Chicken pox    CKD (chronic kidney disease), stage III (Post)    a. s/p R nephrectomy./ aneurysm  Conversion disorder    COPD (chronic obstructive pulmonary disease) (HCC)    Depression    Essential hypertension    GERD (gastroesophageal reflux disease)    Heart murmur    Hyperlipidemia    ILD (interstitial lung disease) (HCC)    mild; 2/2 RA diagnosis   Inflammatory arthritis    a. hands/carpal tunnel.  b. Low titer rheumatoid factor. c. Negative anti-CCP antibodies. d. Plaquenil.    Macular degeneration    Nocturnal hypoxemia    Non-Obstructive CAD    a. 07/2009 Cath ([facility]): nonobs dzs;  b. 03/2011 Cath [facility]): nonobs dzs.   Osteoarthritis    a. Knees.   PAD (peripheral artery disease) (HCC)    PUD (peptic ulcer disease)    S/P right hip fracture    11/01/16 s/p repair   Shoulder pain    Sleep apnea    no cpap / minimal   Spinal stenosis at L4-L5 level    severe with L4/L5 anterolisthesis grade 1 anterolisthesis    Toxic maculopathy    Valvular heart disease    a.) TTE 07/06/2015: EF 55-60%; Mild MR/AR/TR; Mild AS with MPG 13.0 mmHg.    SURGICAL HISTORY: Past Surgical History:  Procedure Laterality Date   APPENDECTOMY     APPLICATION OF INTRAOPERATIVE CT SCAN N/A 05/23/2021   Procedure: APPLICATION OF INTRAOPERATIVE CT SCAN;  Surgeon: Meade Maw, MD;  Location: [facility] ORS;  Service: Neurosurgery;  Laterality: N/A;   BACK SURGERY     lumbar fusion   BUNIONECTOMY Right    CATARACT EXTRACTION, BILATERAL     CESAREAN SECTION     x1   CHOLECYSTECTOMY N/A 05/11/2016   Procedure: LAPAROSCOPIC CHOLECYSTECTOMY;  Surgeon: Florene Glen, MD;  Location: [facility] ORS;  Service: General;  Laterality: N/A;   COLONOSCOPY WITH PROPOFOL N/A 04/02/2016   Procedure: COLONOSCOPY WITH PROPOFOL;  Surgeon: Jonathon Bellows, MD;  Location: [facility] ENDOSCOPY;  Service: Endoscopy;  Laterality: N/A;   ENDOSCOPIC RETROGRADE CHOLANGIOPANCREATOGRAPHY (ERCP) WITH PROPOFOL N/A 05/08/2016   Procedure: ENDOSCOPIC RETROGRADE CHOLANGIOPANCREATOGRAPHY (ERCP) WITH PROPOFOL;  Surgeon: Lucilla Lame, MD;  Location: [facility] ENDOSCOPY;  Service: Endoscopy;  Laterality: N/A;   ERCP     with biliary spincterotomy 05/08/16 Dr. Allen Norris for choledocholithiasis    ESOPHAGEAL DILATION  04/02/2016   Procedure: ESOPHAGEAL DILATION;  Surgeon: Jonathon Bellows, MD;  Location: [facility] ENDOSCOPY;  Service: Endoscopy;;   ESOPHAGOGASTRODUODENOSCOPY (EGD) WITH PROPOFOL N/A 04/02/2016   Procedure: ESOPHAGOGASTRODUODENOSCOPY (EGD) WITH PROPOFOL;   Surgeon: Jonathon Bellows, MD;  Location: [facility] ENDOSCOPY;  Service: Endoscopy;  Laterality: N/A;   HIP ARTHROPLASTY Right 11/01/2016   Procedure: ARTHROPLASTY BIPOLAR HIP (HEMIARTHROPLASTY);  Surgeon: Corky Mull, MD;  Location: [facility] ORS;  Service: Orthopedics;  Laterality: Right;   LUMBAR LAMINECTOMY/DECOMPRESSION MICRODISCECTOMY N/A 12/11/2020   Procedure: LEFT L2-3 MICRODISCECTOMY, L3-4 AND L5-S1 DECOMPRESSION;  Surgeon: Meade Maw, MD;  Location: [facility] ORS;  Service: Neurosurgery;  Laterality: N/A;   NEPHRECTOMY  1988   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   ORIF SCAPHOID FRACTURE Left 12/21/2019   Procedure: OPEN REDUCTION INTERNAL FIXATION (ORIF) OF LEFT SCAPULAR NONUNION WITH BONE GRAFT;  Surgeon: Corky Mull, MD;  Location: [facility] ORS;  Service: Orthopedics;  Laterality: Left;   osteoporosis     noted DEXA 08/19/16    REPLACEMENT TOTAL KNEE Right    REVERSE SHOULDER ARTHROPLASTY Right 11/04/2017   Procedure: REVERSE SHOULDER ARTHROPLASTY;  Surgeon: Corky Mull, MD;  Location: [facility] OR[facility];  Service: Orthopedics;  Laterality: Right;  REVERSE SHOULDER ARTHROPLASTY Left 07/26/2018   Procedure: REVERSE SHOULDER ARTHROPLASTY;  Surgeon: Corky Mull, MD;  Location: [facility] OR[facility];  Service: Orthopedics;  Laterality: Left;   TONSILLECTOMY     TOTAL HIP ARTHROPLASTY  12/10/11   [facility] left hip   TOTAL HIP ARTHROPLASTY Bilateral    TRANSFORAMINAL LUMBAR INTERBODY FUSION (TLIF) WITH PEDICLE SCREW FIXATION 3 LEVEL N/A 05/23/2021   Procedure: OPEN L2-4 TRANSFORAMINAL LUMBAR INTERBODY FUSION (TLIF) WITH L2-5 POSTERIOR SPINAL FUSION;  Surgeon: Meade Maw, MD;  Location: [facility] ORS;  Service: Neurosurgery;  Laterality: N/A;   TUBAL LIGATION      SOCIAL HISTORY: Social History   Socioeconomic History   Marital status: Widowed    Spouse name: richard   Number of children: 2   Years of education: Some Coll   Highest education level: Some college, no degree   Occupational History   Occupation: Retired    Comment: retired  Tobacco Use   Smoking status: Former    Packs/day: 0.50    Years: 20.00    Total pack years: 10.00    Types: Cigarettes    Quit date: 02/02/1974    Years since quitting: 47.8   Smokeless tobacco: Never  Vaping Use   Vaping Use: Never used  Substance and Sexual Activity   Alcohol use: No   Drug use: No   Sexual activity: Not Currently  Other Topics Concern   Not on file  Social History Narrative   Lives at home in [place] grandson, his wife and their child.   Husband Soniyah Mcglory died covid 68 late 1/2027mrried x 559years   Right-handed.   6 cups coffee per day.   Social Determinants of Health   Financial Resource Strain: Low Risk  (06/18/2021)   Overall Financial Resource Strain (CARDIA)    Difficulty of Paying Living Expenses: Not hard at all  Food Insecurity: No Food Insecurity (06/18/2021)   Hunger Vital Sign    Worried About Running Out of Food in the Last Year: Never true    Ran Out of Food in the Last Year: Never true  Transportation Needs: Unmet Transportation Needs (06/26/2021)   PRAPARE - Transportation    Lack of Transportation (Medical): Yes    Lack of Transportation (Non-Medical): Yes  Physical Activity: Inactive (11/24/2019)   Exercise Vital Sign    Days of Exercise per Week: 0 days    Minutes of Exercise per Session: 0 min  Stress: No Stress Concern Present (06/18/2021)   [place]   Feeling of Stress : Not at all  Social Connections: Moderately Integrated (06/18/2021)   Social Connection and Isolation Panel [NHANES]    Frequency of Communication with Friends and Family: More than three times a week    Frequency of Social Gatherings with Friends and Family: Never    Attends Religious Services: More than 4 times per year    Active Member of [employer] Organizations: No    Attends CArchivistMeetings: Never     Marital Status: Married  IHuman resources officerViolence: Not At Risk (06/18/2021)   Humiliation, Afraid, Rape, and Kick questionnaire    Fear of Current or Ex-Partner: No    Emotionally Abused: No    Physically Abused: No    Sexually Abused: No    FAMILY HISTORY: Family History  Problem Relation Age of Onset   Rheum arthritis Mother    Asthma Mother    Parkinson's disease Mother  Heart disease Mother    Stroke Mother    Hypertension Mother    Heart attack Father    Heart disease Father    Hypertension Father    Peripheral Artery Disease Father    Diabetes Son    Gout Son    Asthma Sister    Heart disease Sister    Lung cancer Sister    Heart disease Sister    Heart disease Sister    Breast cancer Sister    Heart attack Sister    Heart disease Brother    Heart disease Maternal Grandmother    Diabetes Maternal Grandmother    Colon cancer Maternal Grandmother    Cancer Maternal Grandmother        Hodgkins lymphoma   Heart disease Brother    Alcohol abuse Brother    Depression Brother    Dementia Son     ALLERGIES:  is allergic to ceftin [cefuroxime axetil]; lisinopril; sulfa antibiotics; sulfasalazine; morphine; xarelto [rivaroxaban]; adhesive [tape]; antihistamines, chlorpheniramine-type; antivert [meclizine hcl]; aspirin; contrast media [iodinated contrast media]; decongestant [pseudoephedrine hcl]; doxycycline; levaquin [levofloxacin in d5w]; polymyxin b; and tetanus toxoids.  MEDICATIONS:  Current Outpatient Medications  Medication Sig Dispense Refill   albuterol (PROVENTIL) (2.5 MG/3ML) 0.083% nebulizer solution Take by nebulization.     albuterol (VENTOLIN HFA) 108 (90 Base) MCG/ACT inhaler Inhale 2 puffs into the lungs every 6 (six) hours as needed for wheezing or shortness of breath. 18 g 11   amLODipine (NORVASC) 5 MG tablet Take 1 tablet (5 mg total) by mouth daily. 90 tablet 3   apixaban (ELIQUIS) 5 MG TABS tablet Take 1 tablet (5 mg total) by mouth 2 (two)  times daily. 180 tablet 1   budesonide (PULMICORT) 0.25 MG/2ML nebulizer solution USE 1 VIAL  IN  NEBULIZER TWICE  DAILY - rinse mouth after treatment 120 mL 5   busPIRone (BUSPAR) 5 MG tablet Take 1 tablet (5 mg total) by mouth 2 (two) times daily.     Cholecalciferol (VITAMIN D3) 50 MCG (2000 UT) TABS Take 4,000 Units by mouth daily. 90 tablet 3   dicyclomine (BENTYL) 10 MG capsule TAKE 1 CAPSULE (10 MG TOTAL) BY MOUTH 2 (TWO) TIMES DAILY AS NEEDED FOR SPASMS. 180 capsule 2   escitalopram (LEXAPRO) 10 MG tablet Take 1 tablet (10 mg total) by mouth daily. 90 tablet 3   famotidine (PEPCID) 20 MG tablet TAKE 1 TABLET (20 MG TOTAL) BY MOUTH DAILY. BEFORE BREAKFAST OR DINNER 90 tablet 1   fluticasone (FLONASE) 50 MCG/ACT nasal spray Place 2 sprays into both nostrils daily. In am     gabapentin (NEURONTIN) 300 MG capsule TAKE 2 CAPSULES (600 MG TOTAL) BY MOUTH IN THE MORNING AND AT BEDTIME. 360 capsule 2   ipratropium-albuterol (DUONEB) 0.5-2.5 (3) MG/3ML SOLN Take 3 mLs by nebulization every 6 (six) hours as needed. 360 mL 5   lamoTRIgine (LAMICTAL) 100 MG tablet TAKE 1 TAB 2 TIMES DAILY. FURTHER REFILLS NEW PSYCH FOR ALL PSYCH MEDS ONLY TEMP SUPPLY FROM PCP 180 tablet 1   leflunomide (ARAVA) 20 MG tablet Take 1 tablet (20 mg total) by mouth daily.     lidocaine (LIDODERM) 5 % Place 1 patch onto the skin 2 (two) times daily as needed. Remove & Discard patch within 12 hours or as directed by MD (Patient taking differently: Place 1 patch onto the skin daily. Remove & Discard patch within 12 hours or as directed by MD) 120 patch 2   lovastatin (MEVACOR) 20  MG tablet TAKE 1 TABLET BY MOUTH EVERYDAY AT BEDTIME *STOP TALKING '40MG'$ * (Patient taking differently: Take 20 mg by mouth at bedtime.) 90 tablet 2   methocarbamol (ROBAXIN) 500 MG tablet Take 500 mg by mouth 4 (four) times daily as needed. Taking twice a day currently     methylPREDNISolone (MEDROL DOSEPAK) 4 MG TBPK tablet Take 5 mg by mouth.      montelukast (SINGULAIR) 10 MG tablet TAKE 1 TABLET BY MOUTH EVERY DAY (Patient taking differently: Take 10 mg by mouth daily.) 90 tablet 1   multivitamin-lutein (OCUVITE-LUTEIN) CAPS capsule Take 1 capsule by mouth at bedtime.     MYRBETRIQ 50 MG TB24 tablet TAKE 1 TABLET BY MOUTH EVERY DAY 90 tablet 3   nebivolol (BYSTOLIC) 5 MG tablet Take 1 tablet (5 mg total) by mouth in the morning and at bedtime. (Note dose changed from 1/2 10 mg bid to 5 mg bid) 180 tablet 3   OXYGEN Inhale 2 L into the lungs at bedtime.     pantoprazole (PROTONIX) 40 MG tablet TAKE 1 TABLET BY MOUTH 2 TIMES DAILY 30 MIN BEFORE FOOD (NOTE REDUCTION IN FREQUENCY) 180 tablet 1   QUEtiapine (SEROQUEL) 25 MG tablet TAKE 1 TABLET (25 MG TOTAL) BY MOUTH AT BEDTIME. AGAIN LAST FILL FURTHER REFILLS FROM PSYCHIATRY NO EXCEPTIONS 90 tablet 0   sucralfate (CARAFATE) 1 g tablet TAKE 1 TABLET BY MOUTH 2 TIMES DAILY. 180 tablet 2   torsemide (DEMADEX) 20 MG tablet Take 1 tablet (20 mg total) by mouth daily. 30 tablet 3   zoledronic acid (RECLAST) 5 MG/100ML SOLN injection Inject 5 mg into the vein once.     ferrous sulfate 325 (65 FE) MG EC tablet Take 1 tablet (325 mg total) by mouth 2 (two) times daily. (Patient taking differently: Take 325 mg by mouth 2 (two) times daily. Pt reports MD instructed her to take twice a day every other day.) 60 tablet 3   predniSONE (DELTASONE) 5 MG tablet Take 5 mg by mouth every morning. (Patient not taking: Reported on 12/09/2021)     No current facility-administered medications for this visit.     PHYSICAL EXAMINATION: ECOG PERFORMANCE STATUS: 2 - Symptomatic, <50% confined to bed Vitals:   12/09/21 1354  BP: 135/80  Pulse: 83  Temp: 98.2 F (36.8 C)  SpO2: 97%   Filed Weights   12/09/21 1354  Weight: 175 lb (79.4 kg)    Physical Exam Constitutional:      General: She is not in acute distress.    Comments: Patient sits in the wheelchair  HENT:     Head: Normocephalic and atraumatic.   Eyes:     General: No scleral icterus. Cardiovascular:     Rate and Rhythm: Normal rate and regular rhythm.     Heart sounds: Murmur heard.  Pulmonary:     Effort: Pulmonary effort is normal. No respiratory distress.     Breath sounds: No wheezing.  Abdominal:     General: Bowel sounds are normal. There is no distension.     Palpations: Abdomen is soft.  Musculoskeletal:        General: Swelling present. No deformity. Normal range of motion.     Cervical back: Normal range of motion and neck supple.     Comments: Bilateral lower extremity edema, right worse than left  Skin:    General: Skin is warm and dry.     Findings: No erythema or rash.  Neurological:  Mental Status: She is alert and oriented to person, place, and time. Mental status is at baseline.     Cranial Nerves: No cranial nerve deficit.     Coordination: Coordination normal.  Psychiatric:        Mood and Affect: Mood normal.         RADIOGRAPHIC STUDIES: I have personally reviewed the radiological images as listed and agreed with the findings in the report. No results found.  LABORATORY DATA:  I have reviewed the data as listed    Latest Ref Rng & Units 12/09/2021    1:37 PM 10/09/2021   11:19 AM 09/12/2021    6:45 AM  CBC  WBC 4.0 - 10.5 K/uL 15.4  12.0  15.7   Hemoglobin 12.0 - 15.0 g/dL 12.4  11.3  9.6   Hematocrit 36.0 - 46.0 % 39.7  36.8  31.1   Platelets 150 - 400 K/uL 248  220  312       Latest Ref Rng & Units 12/09/2021    1:37 PM 10/09/2021   11:19 AM 09/12/2021    6:45 AM  CMP  Glucose 70 - 99 mg/dL 186  122  127   BUN 8 - 23 mg/dL '27  25  29   '$ Creatinine 0.44 - 1.00 mg/dL 1.50  1.68  1.18   Sodium 135 - 145 mmol/L 139  140  141   Potassium 3.5 - 5.1 mmol/L 3.4  3.5  3.7   Chloride 98 - 111 mmol/L 96  100  104   CO2 22 - 32 mmol/L '29  29  29   '$ Calcium 8.9 - 10.3 mg/dL 9.1  8.7  7.7   Total Protein 6.5 - 8.1 g/dL 7.3  6.8    Total Bilirubin 0.3 - 1.2 mg/dL 0.5  0.4    Alkaline Phos 38  - 126 U/L 84  100    AST 15 - 41 U/L 32  24    ALT 0 - 44 U/L 18  14       Iron/TIBC/Ferritin/ %Sat    Component Value Date/Time   IRON 76 12/09/2021 1337   TIBC 350 12/09/2021 1337   FERRITIN 68 12/09/2021 1337   IRONPCTSAT 22 12/09/2021 1337   IRONPCTSAT 12 12/28/2016 1645

## 2021-12-09 NOTE — Assessment & Plan Note (Signed)
Patient has had previous work-up done.  Chronic leukocytosis was felt to be secondary to steroid use, autoimmune disease.

## 2021-12-10 IMAGING — RF DG C-ARM 1-60 MIN
1 series · 5 of 5 positions shown · non-contrast
Comparison: CT lumbar spine 11/20/2020.

CLINICAL DATA: surgery

EXAM:
DG C-ARM 1-60 MIN; LUMBAR SPINE - 2-3 VIEW
FLUOROSCOPY TIME:  Fluoroscopy Time:  10.7 seconds.
Radiation Exposure Index (if provided by the fluoroscopic device):
10.58 mGy
Number of Acquired Spot Images: 5

[Series 1: dg x-ray · 0.20mm/px · 5 of 5 slices shown]
[im 1/5]
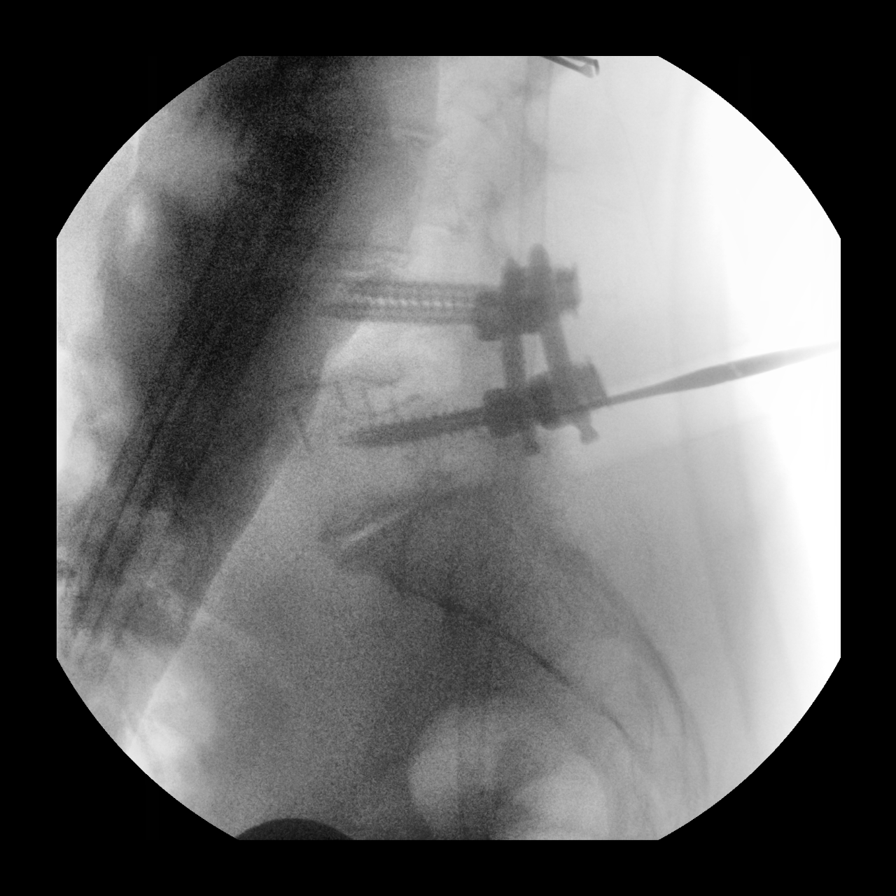
[im 2/5]
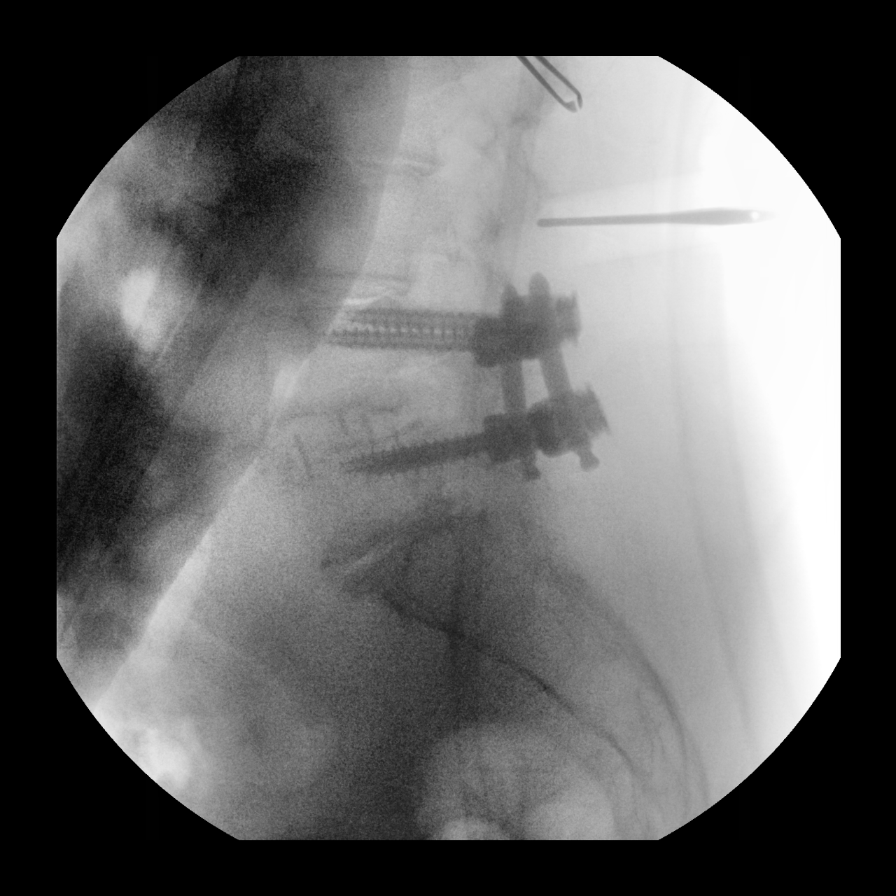
[im 3/5]
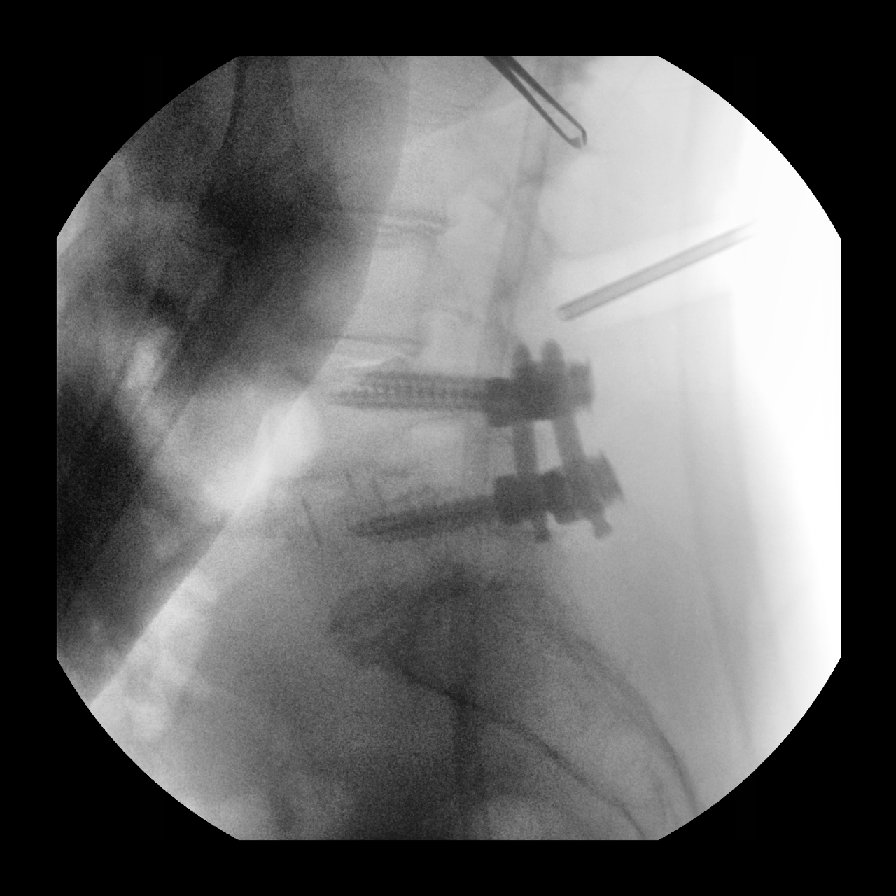
[im 4/5]
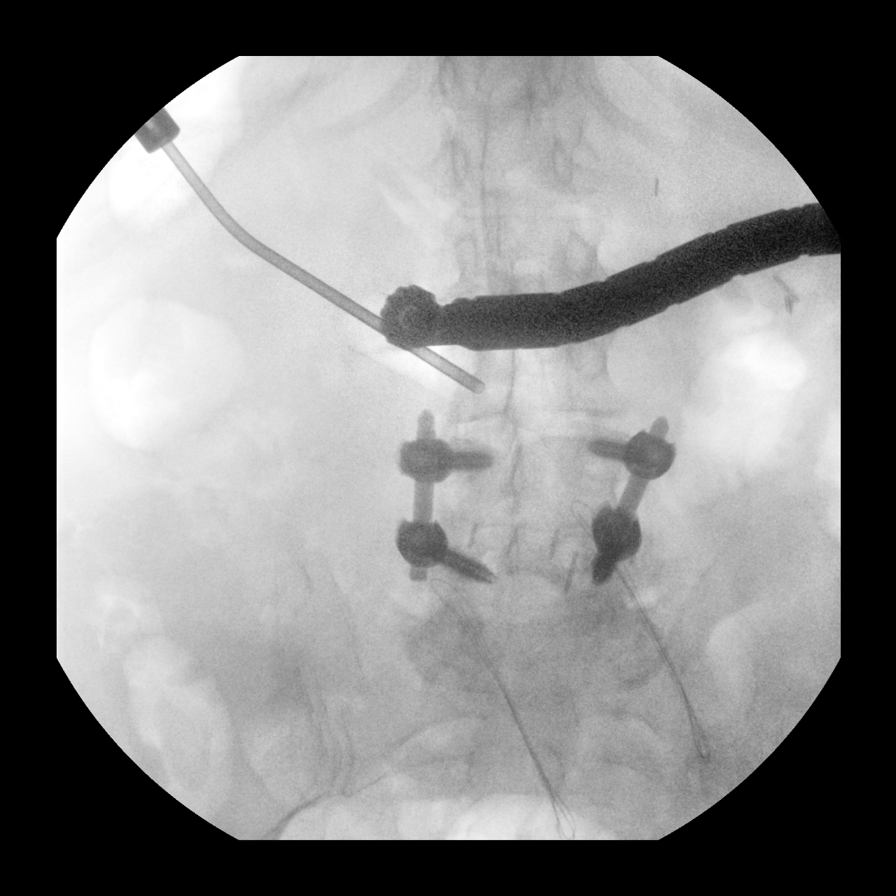
[im 5/5]
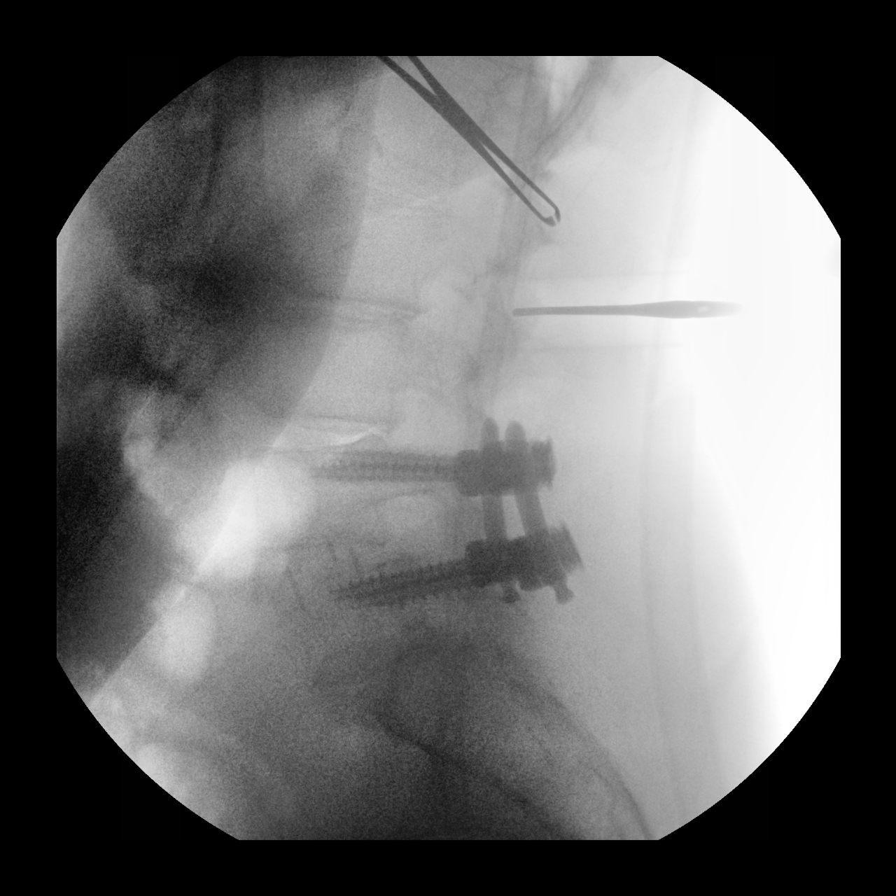

[5 of 5 positions shown; findings below may reference images not displayed]

FINDINGS: Five C-arm fluoroscopic images were obtained intraoperatively and
submitted for post operative interpretation. Prior L4-L5 PLIF.
Initial image demonstrates surgical probe posteriorly at the lower
L5 level. Second image demonstrates surgical probe posteriorly at
the L3 level. Third and fourth images demonstrates surgical probe at
the inferior L3 level. Fifth image demonstrates surgical probe at
the L2-L3 level. Please see the performing provider's procedural
report for further detail.
IMPRESSION: Intraoperative fluoroscopy, as detailed above.

## 2021-12-10 IMAGING — RF DG LUMBAR SPINE 2-3V
1 series · 5 of 5 positions shown · non-contrast
Comparison: CT lumbar spine 11/20/2020.

CLINICAL DATA: surgery

EXAM:
DG C-ARM 1-60 MIN; LUMBAR SPINE - 2-3 VIEW
FLUOROSCOPY TIME:  Fluoroscopy Time:  10.7 seconds.
Radiation Exposure Index (if provided by the fluoroscopic device):
10.58 mGy
Number of Acquired Spot Images: 5

[Series 1: dg x-ray · 0.20mm/px · 5 of 5 slices shown]
[im 1/5]
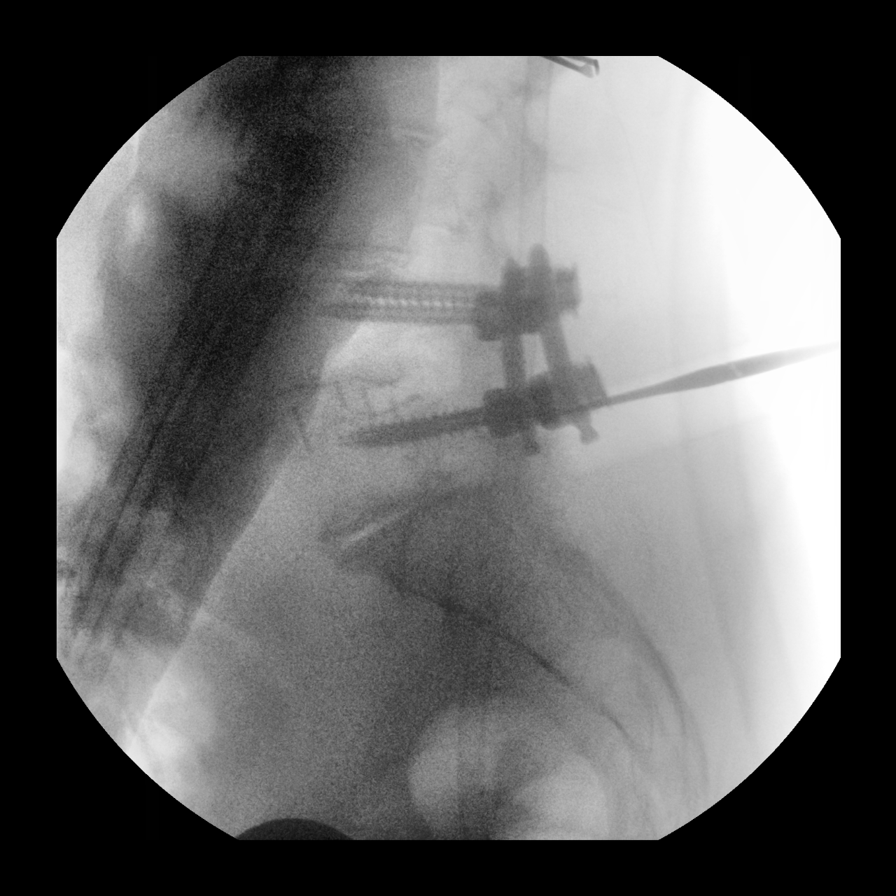
[im 2/5]
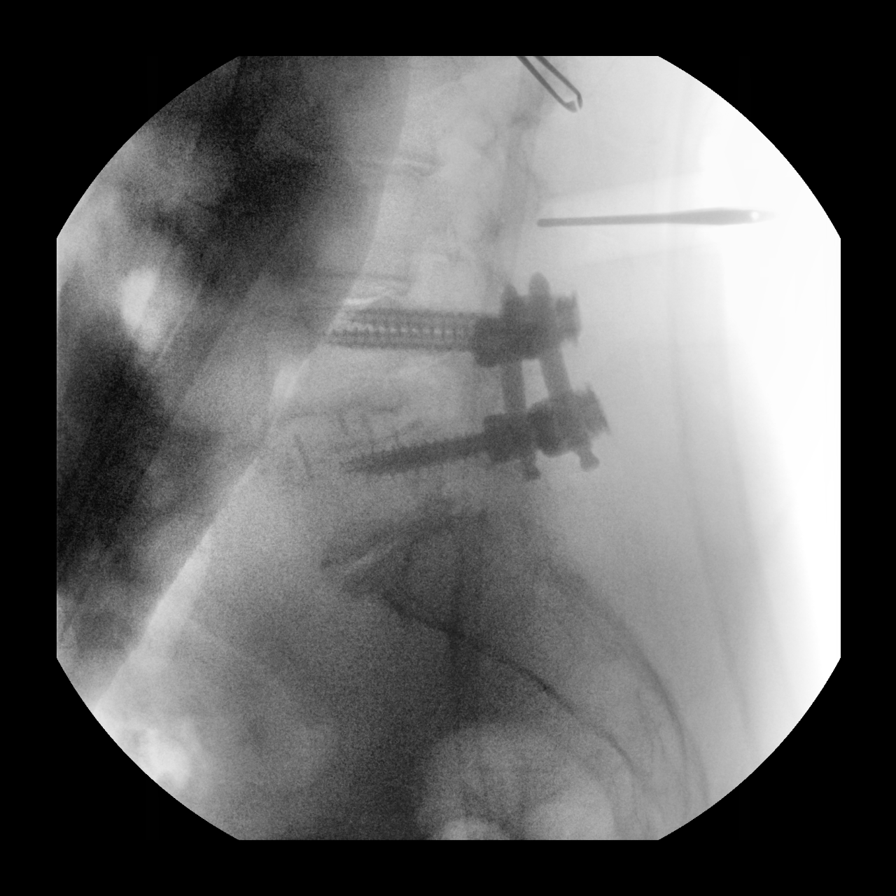
[im 3/5]
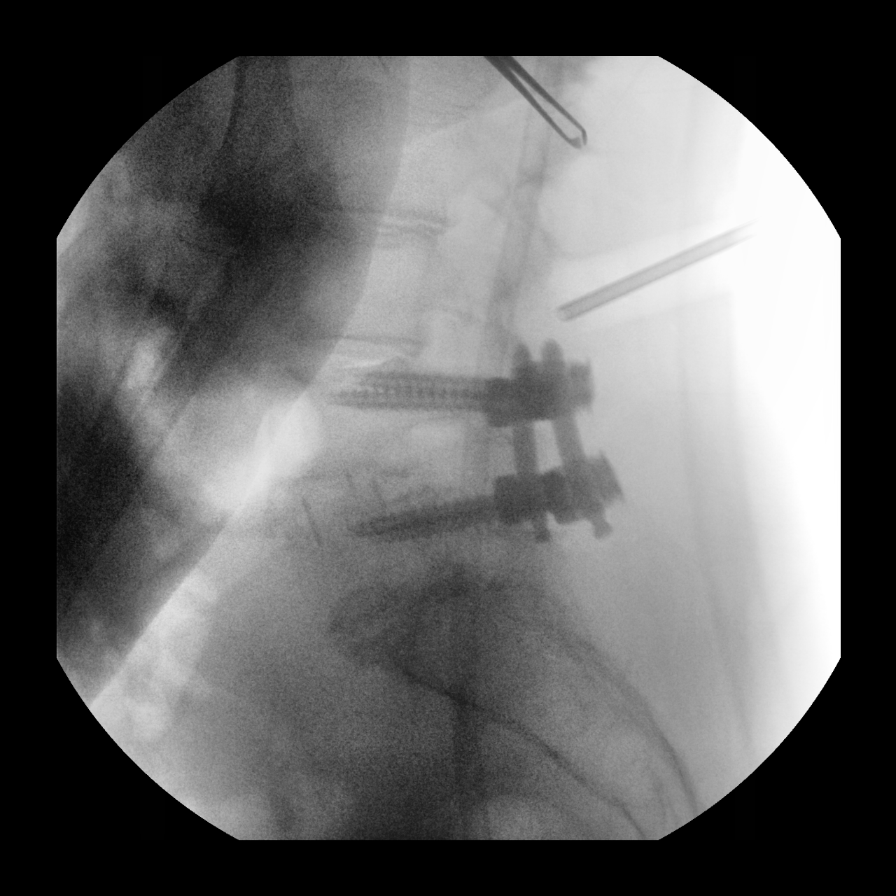
[im 4/5]
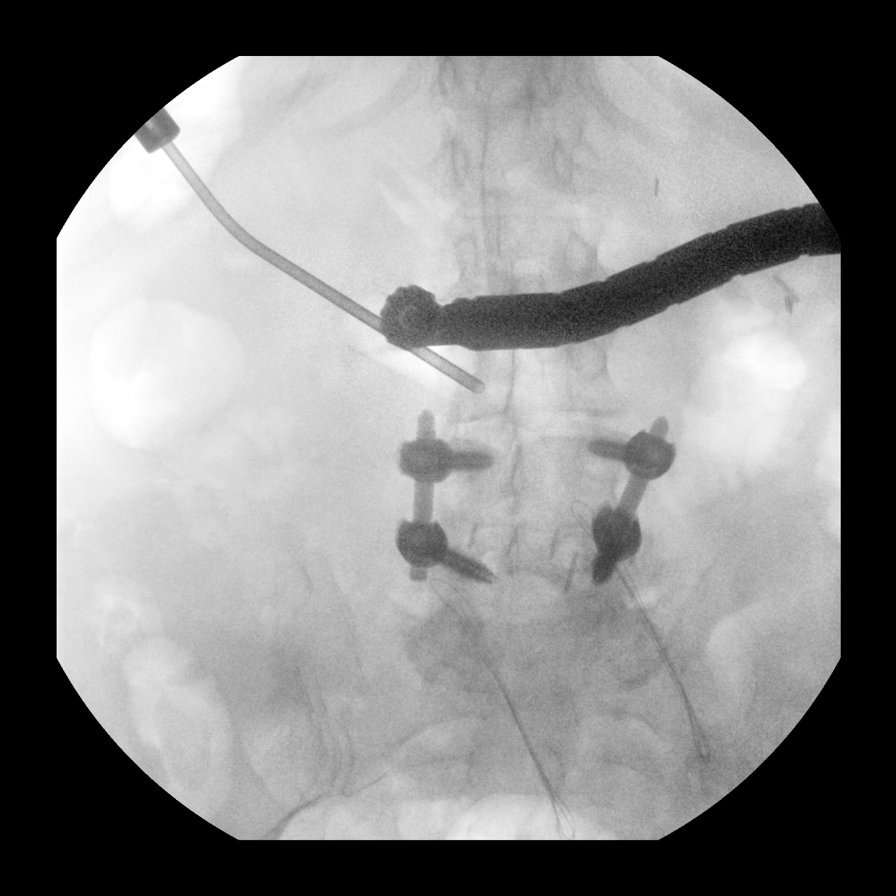
[im 5/5]
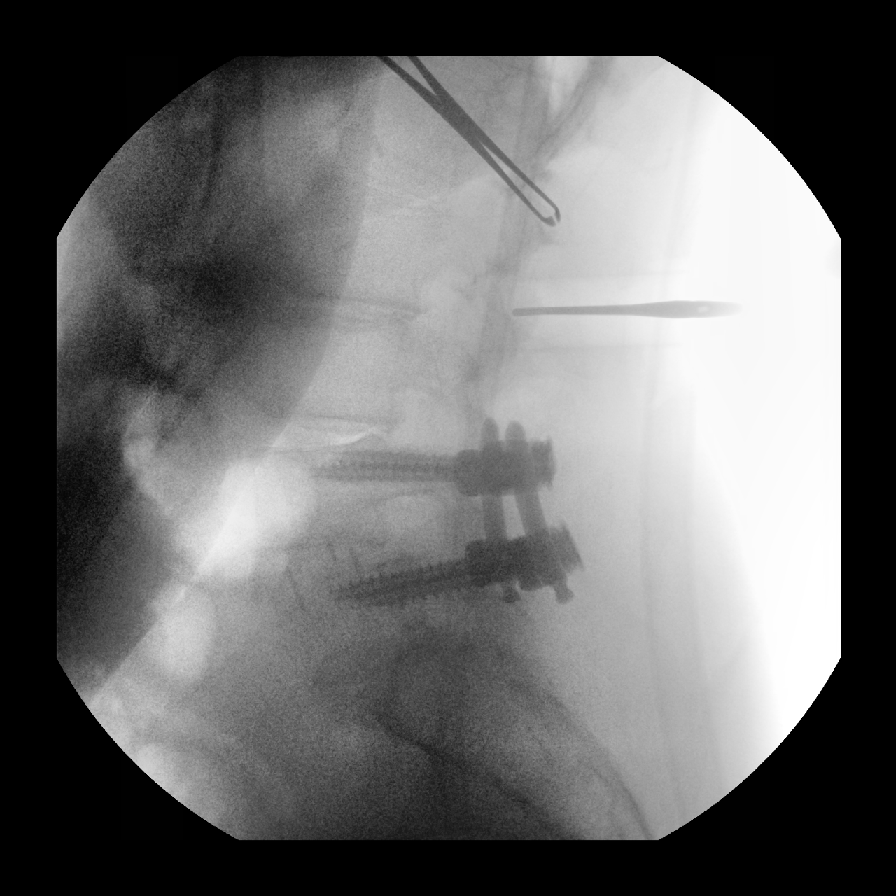

[5 of 5 positions shown; findings below may reference images not displayed]

FINDINGS: Five C-arm fluoroscopic images were obtained intraoperatively and
submitted for post operative interpretation. Prior L4-L5 PLIF.
Initial image demonstrates surgical probe posteriorly at the lower
L5 level. Second image demonstrates surgical probe posteriorly at
the L3 level. Third and fourth images demonstrates surgical probe at
the inferior L3 level. Fifth image demonstrates surgical probe at
the L2-L3 level. Please see the performing provider's procedural
report for further detail.
IMPRESSION: Intraoperative fluoroscopy, as detailed above.

## 2021-12-10 IMAGING — RF DG C-ARM 1-60 MIN
1 series · 5 of 5 positions shown · non-contrast
Comparison: CT lumbar spine 11/20/2020.

CLINICAL DATA: surgery

EXAM:
DG C-ARM 1-60 MIN; LUMBAR SPINE - 2-3 VIEW
FLUOROSCOPY TIME:  Fluoroscopy Time:  10.7 seconds.
Radiation Exposure Index (if provided by the fluoroscopic device):
10.58 mGy
Number of Acquired Spot Images: 5

[Series 1: dg x-ray · 0.20mm/px · 5 of 5 slices shown]
[im 1/5]
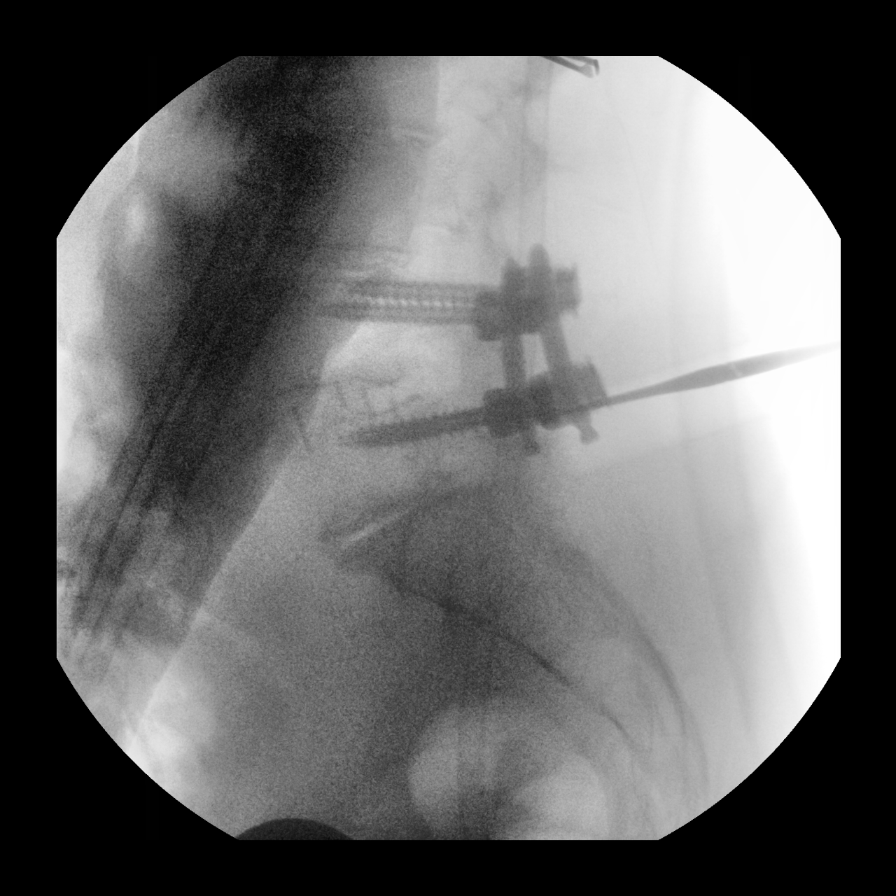
[im 2/5]
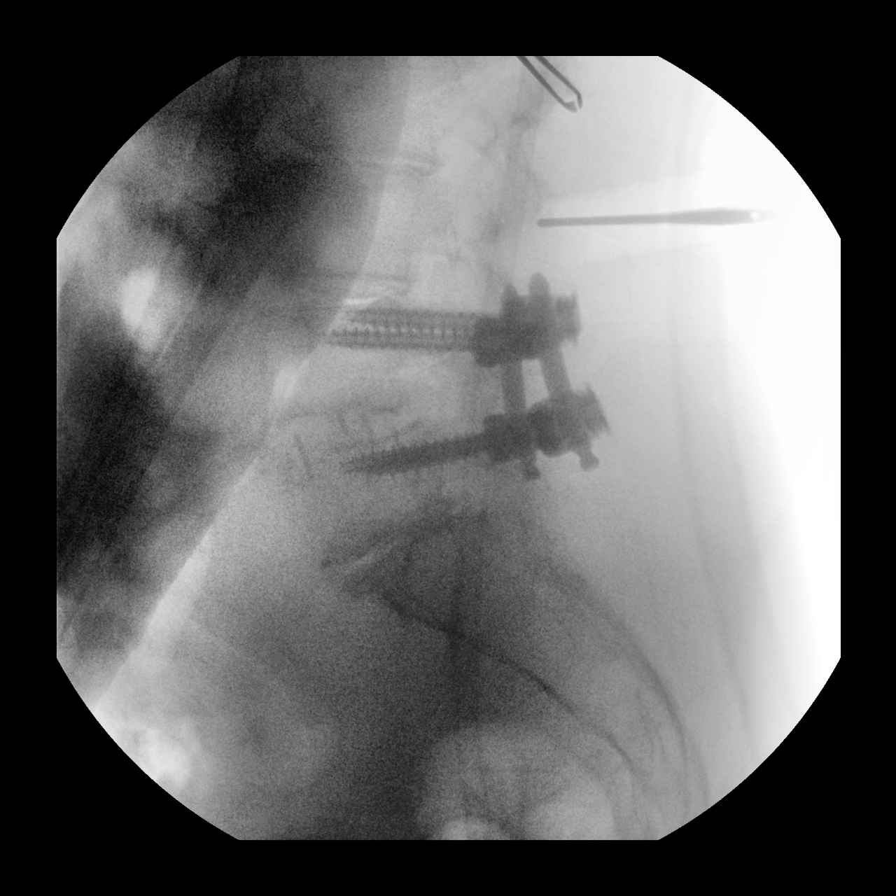
[im 3/5]
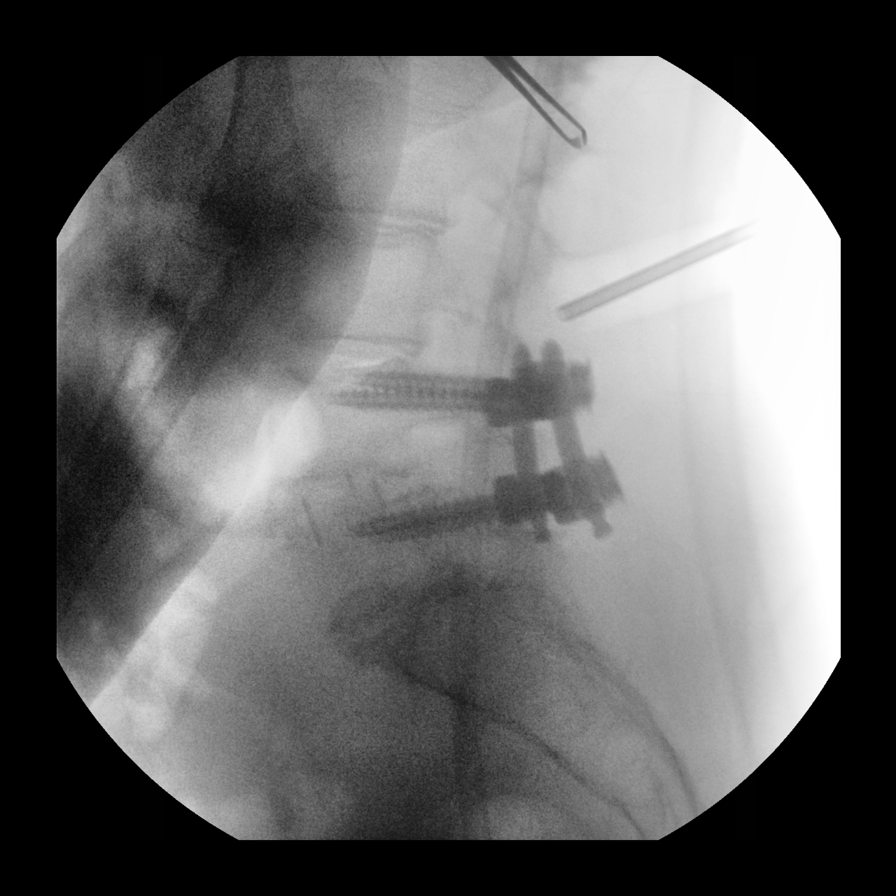
[im 4/5]
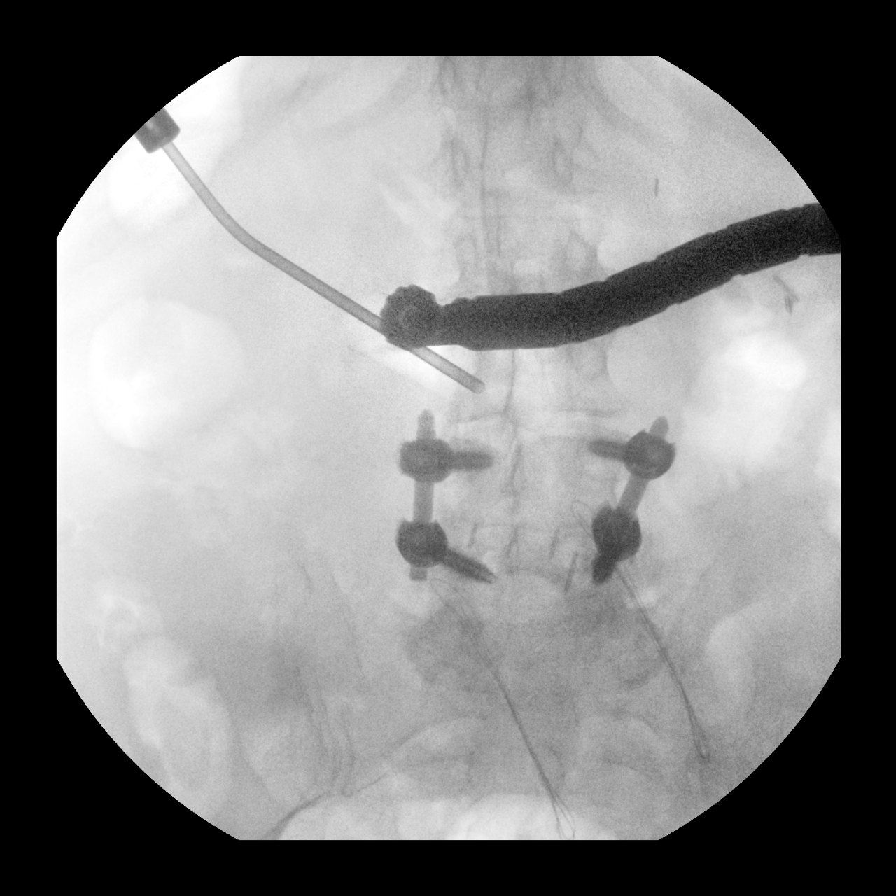
[im 5/5]
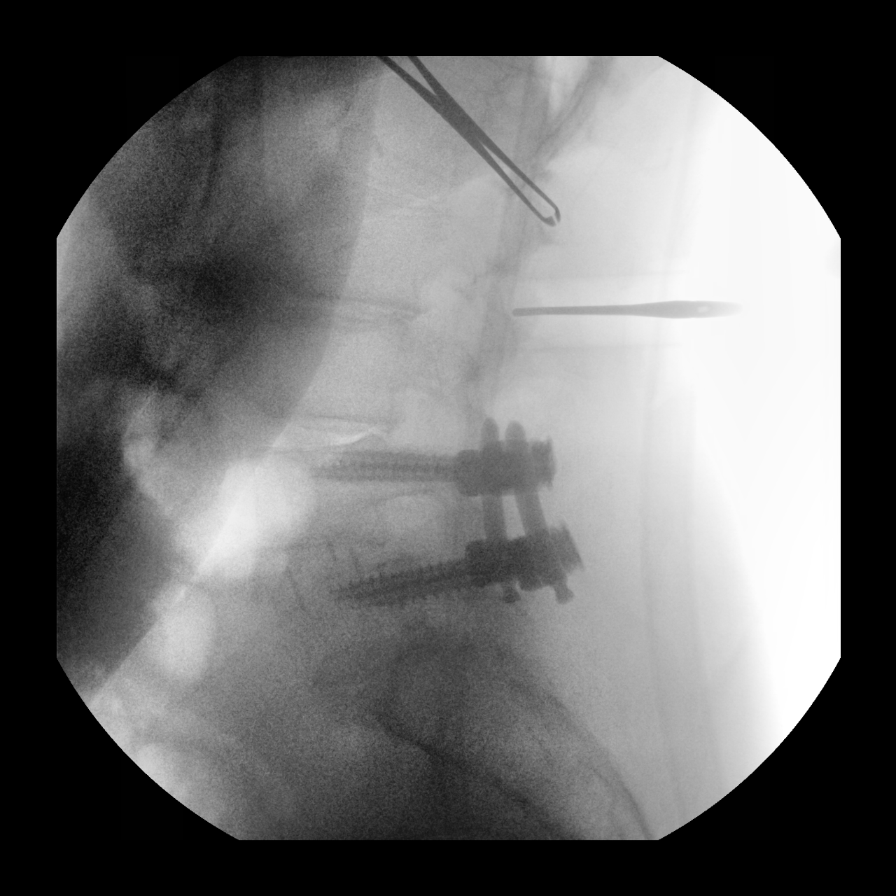

[5 of 5 positions shown; findings below may reference images not displayed]

FINDINGS: Five C-arm fluoroscopic images were obtained intraoperatively and
submitted for post operative interpretation. Prior L4-L5 PLIF.
Initial image demonstrates surgical probe posteriorly at the lower
L5 level. Second image demonstrates surgical probe posteriorly at
the L3 level. Third and fourth images demonstrates surgical probe at
the inferior L3 level. Fifth image demonstrates surgical probe at
the L2-L3 level. Please see the performing provider's procedural
report for further detail.
IMPRESSION: Intraoperative fluoroscopy, as detailed above.

## 2021-12-10 IMAGING — RF DG C-ARM 1-60 MIN
1 series · 5 of 5 positions shown · non-contrast
Comparison: CT lumbar spine 11/20/2020.

CLINICAL DATA: surgery

EXAM:
DG C-ARM 1-60 MIN; LUMBAR SPINE - 2-3 VIEW
FLUOROSCOPY TIME:  Fluoroscopy Time:  10.7 seconds.
Radiation Exposure Index (if provided by the fluoroscopic device):
10.58 mGy
Number of Acquired Spot Images: 5

[Series 1: dg x-ray · 0.20mm/px · 5 of 5 slices shown]
[im 1/5]
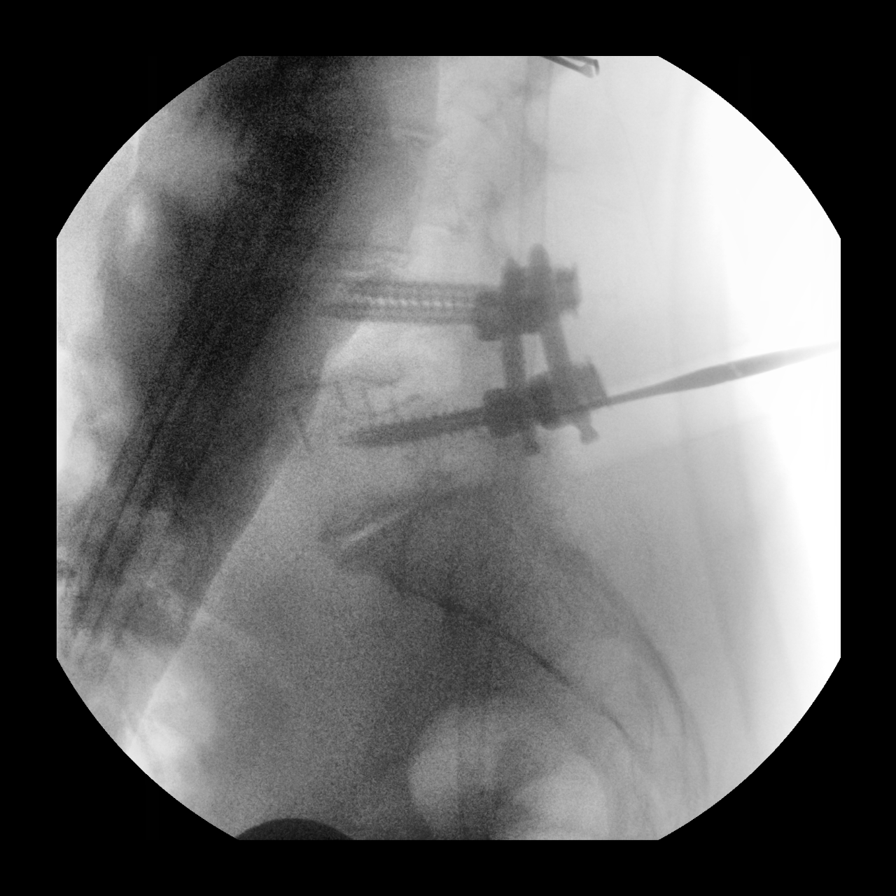
[im 2/5]
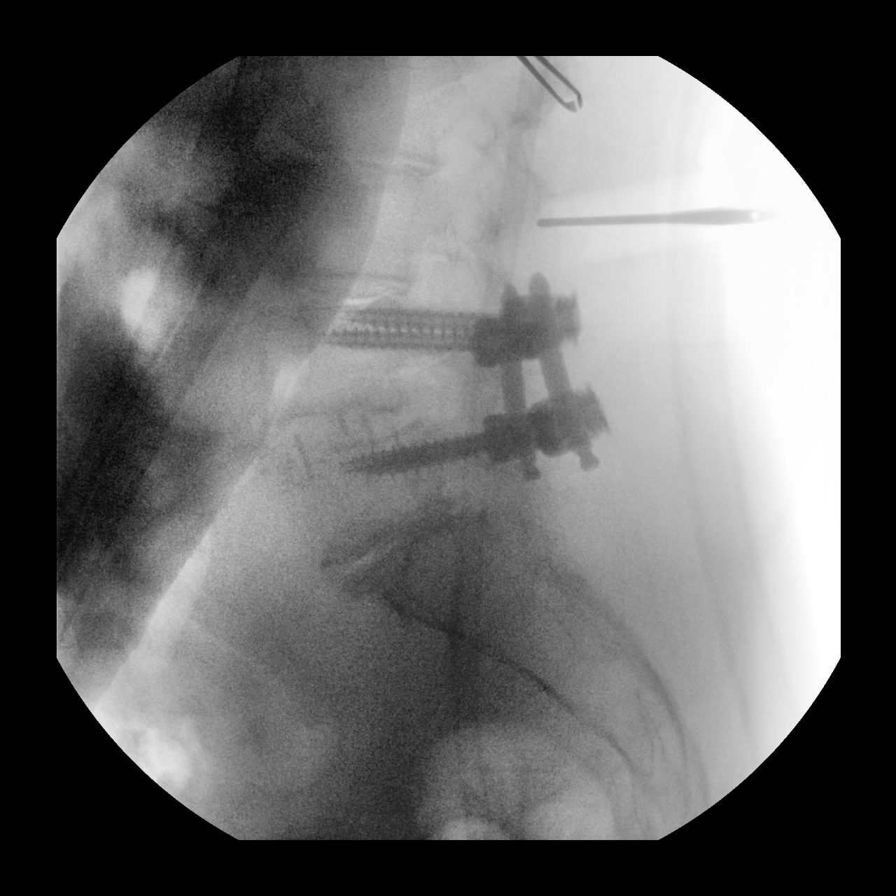
[im 3/5]
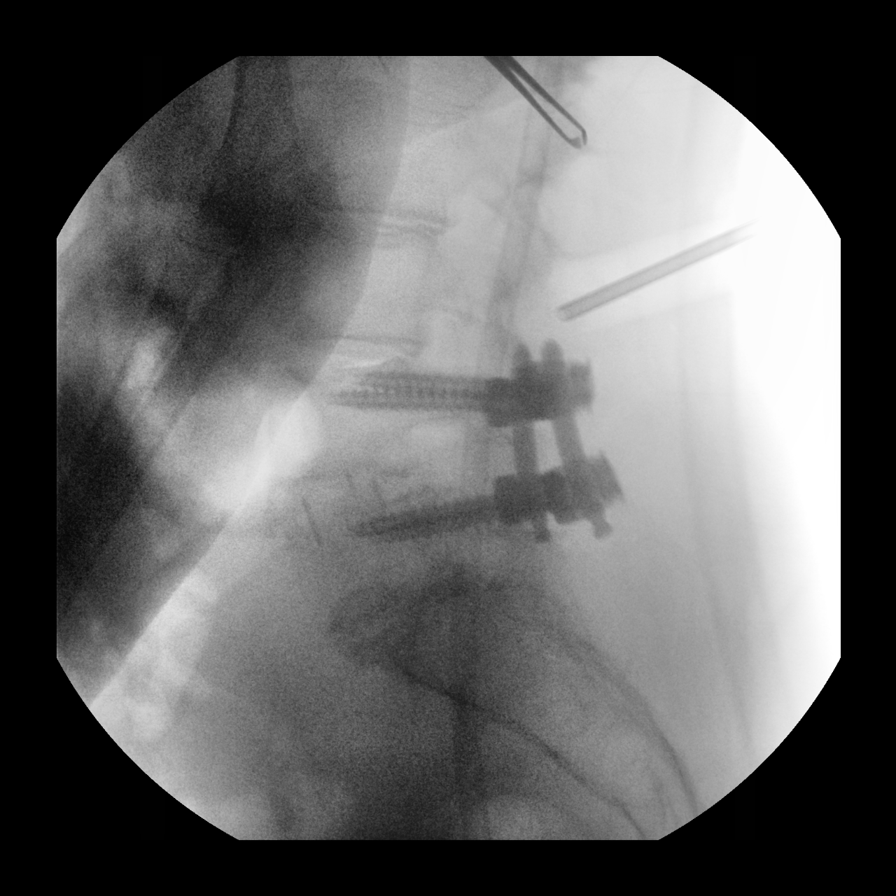
[im 4/5]
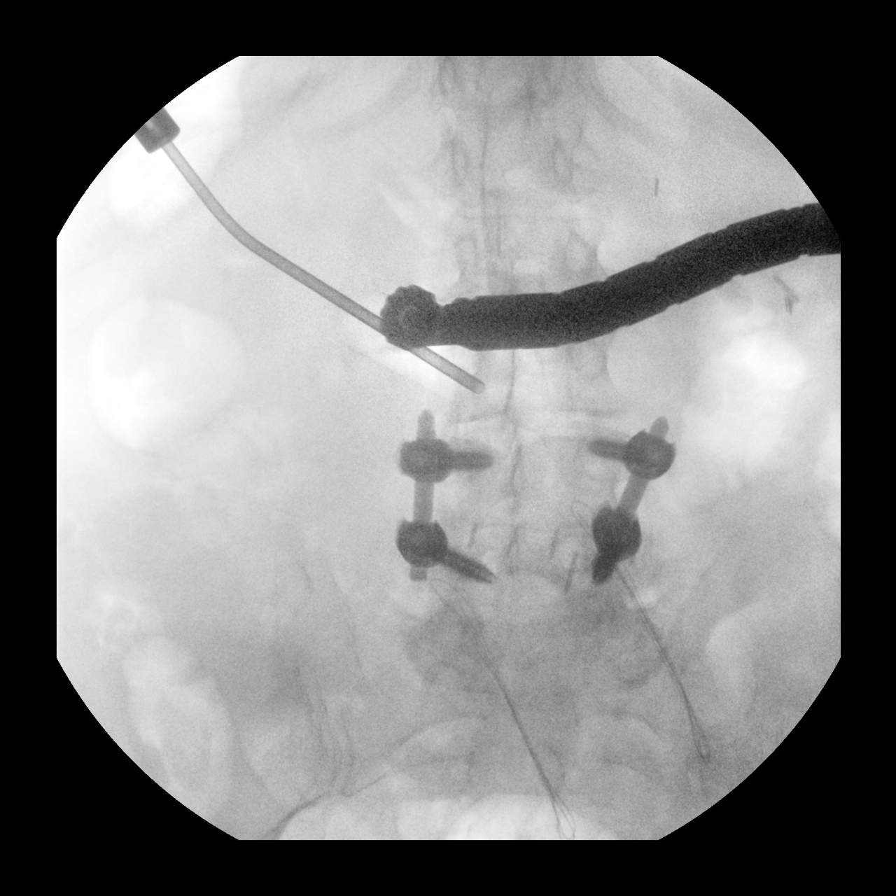
[im 5/5]
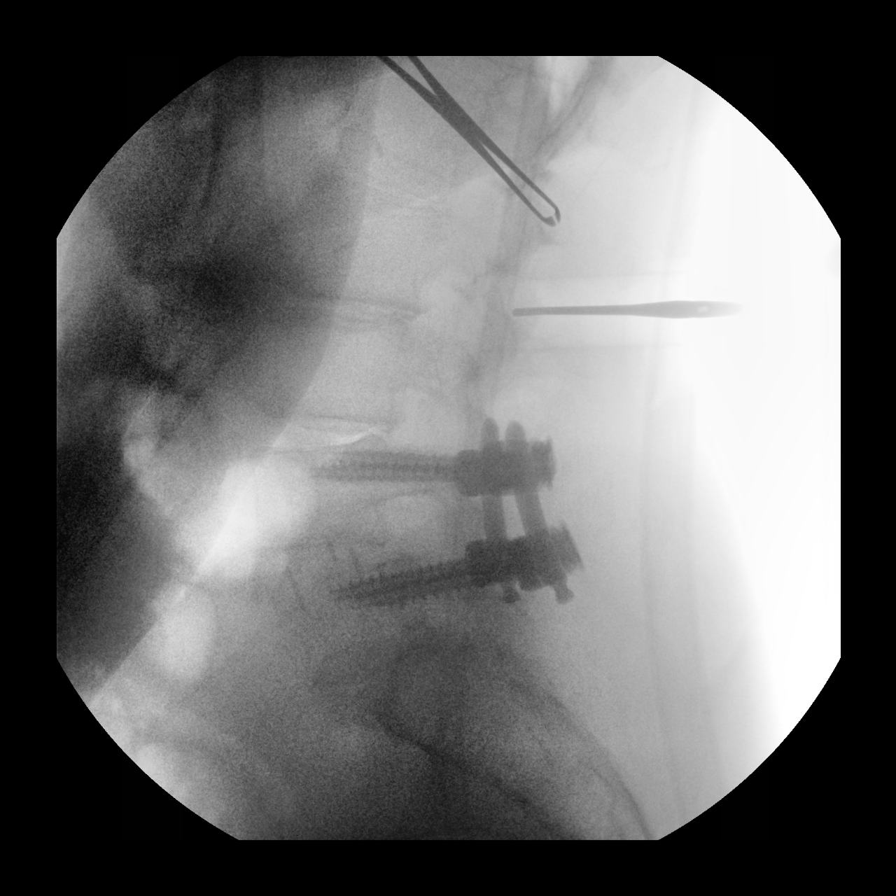

[5 of 5 positions shown; findings below may reference images not displayed]

FINDINGS: Five C-arm fluoroscopic images were obtained intraoperatively and
submitted for post operative interpretation. Prior L4-L5 PLIF.
Initial image demonstrates surgical probe posteriorly at the lower
L5 level. Second image demonstrates surgical probe posteriorly at
the L3 level. Third and fourth images demonstrates surgical probe at
the inferior L3 level. Fifth image demonstrates surgical probe at
the L2-L3 level. Please see the performing provider's procedural
report for further detail.
IMPRESSION: Intraoperative fluoroscopy, as detailed above.

## 2021-12-12 ENCOUNTER — Ambulatory Visit: Payer: Medicare PPO | Admitting: Pulmonary Disease

## 2021-12-30 ENCOUNTER — Telehealth: Payer: Self-pay

## 2021-12-30 NOTE — Telephone Encounter (Signed)
I received a notice of denial for home health services for the patient and I put it in the medical records folder for your review.  Kerri Carter,cma

## 2022-01-01 ENCOUNTER — Ambulatory Visit (INDEPENDENT_AMBULATORY_CARE_PROVIDER_SITE_OTHER): Payer: Medicare PPO | Admitting: Pulmonary Disease

## 2022-01-01 ENCOUNTER — Encounter: Payer: Self-pay | Admitting: Pulmonary Disease

## 2022-01-01 VITALS — BP 110/70 | HR 84 | Temp 98.3°F | Ht 61.5 in | Wt 175.0 lb

## 2022-01-01 DIAGNOSIS — R0602 Shortness of breath: Secondary | ICD-10-CM | POA: Diagnosis not present

## 2022-01-01 DIAGNOSIS — J8489 Other specified interstitial pulmonary diseases: Secondary | ICD-10-CM | POA: Diagnosis not present

## 2022-01-01 DIAGNOSIS — J4489 Other specified chronic obstructive pulmonary disease: Secondary | ICD-10-CM

## 2022-01-01 DIAGNOSIS — J9611 Chronic respiratory failure with hypoxia: Secondary | ICD-10-CM

## 2022-01-01 DIAGNOSIS — J441 Chronic obstructive pulmonary disease with (acute) exacerbation: Secondary | ICD-10-CM | POA: Diagnosis not present

## 2022-01-01 LAB — NITRIC OXIDE: Nitric Oxide: 33

## 2022-01-01 MED ORDER — METHYLPREDNISOLONE 4 MG PO TBPK: ORAL_TABLET | ORAL | 0 refills | Status: DC

## 2022-01-01 MED ORDER — BUDESONIDE 0.5 MG/2ML IN SUSP: 0.5000 mg | Freq: Two times a day (BID) | RESPIRATORY_TRACT | 11 refills | Status: DC

## 2022-01-01 NOTE — Progress Notes (Signed)
Subjective:    Patient ID: Kerri Carter, female    DOB: January 06, 1941, 81 y.o.   MRN: [id number] Patient Care Team: McLean-Scocuzza, Nino Glow, MD as PCP - General (Internal Medicine) Dannielle Karvonen, RN as [facility]  Chief Complaint  Patient presents with   Follow-up    Breathing has been terrible for the last 3 days. SOB and Dry cough. Some Wheezing.   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  It is a scheduled visit.  Kerri Carter was last evaluated on 21 October 2021.  At that time we had proposed to switch her to [employer] and Yupelri however because of cost Kerri Carter is back to taking DuoNeb and Pulmicort.  Kerri Carter had an elective lumbar fusion extension by Dr. Cari Caraway on 23 May 2021 and after that developed issues with respiratory distress and hypoxia.  Since that admission, Kerri Carter has had recurrent issues with upper respiratory infections and multiple admissions to the hospital for COPD or CHF exacerbations.  Last ED visit for COPD exacerbation was on 7 September.  Last admission was in August.  Today Kerri Carter presents stating that Kerri Carter has had "terrible" shortness of breath for the last 3 days dry cough and wheezing.  He has not had any fevers, chills or sweats.  Kerri Carter has not had sputum production or hemoptysis.  No chest pain.  Kerri Carter is compliant with oxygen at 3 L/min continuously.  Despite all of these complaints Kerri Carter is actually quite vivacious in the room and does not appear to be in any distress.  Kerri Carter has not had any myalgias or arthralgias.  No GI symptoms, no urinary symptoms.  No reflux. No other new complaints noted.  Review of Systems A 10 point review of systems was performed and it is as noted above otherwise negative.  Patient Active Problem List   Diagnosis Date Noted   Iron deficiency  anemia due to chronic blood loss 10/09/2021   Anemia in stage 3a chronic kidney disease ([place]) 10/09/2021   Chronic respiratory failure with hypoxia (HCC) 10/01/2021   Chronic heart failure with preserved ejection fraction ([place]) 09/24/2021   Acute on chronic respiratory failure with hypoxia ([place]) 09/10/2021   Toe fracture, right 09/09/2021   Closed nondisplaced fracture of proximal phalanx of lesser toe of right foot    Recurrent falls 09/01/2021   COPD with exacerbation ([place]) 08/20/2021   Acute on chronic diastolic CHF (congestive heart failure) ([place]) 08/20/2021   Paroxysmal atrial fibrillation ([place]) 07/29/2021   Acute on chronic combined systolic (congestive) and diastolic (congestive) heart failure ([place]) 07/28/2021   Stage 3a chronic kidney disease (CKD) ([place]) 07/23/2021   Recurrent pneumonia 07/23/2021   Community acquired pneumonia 07/22/2021   Sepsis due to pneumonia ([place]) 07/22/2021   Lymphocytosis 07/13/2021   Thrombocytosis 06/24/2021   Leukopenia 06/24/2021   Abnormal thyroid function test 06/24/2021   Acute on chronic combined systolic and diastolic CHF (congestive heart failure) ([place]) 05/26/2021   Left lower lobe pneumonia 05/26/2021   Elevated d-dimer 05/26/2021   Acute kidney injury superimposed on chronic kidney disease ([place]) 05/26/2021   Hyperkalemia 05/26/2021   Anxiety and depression 05/26/2021   S/P lumbar fusion 05/23/2021   Physical deconditioning 02/27/2021   Leg pain, bilateral    Respiratory failure with hypoxia ([place]) 01/12/2021   Frequent falls 12/18/2020   Hip  pain 12/18/2020   Acute pain of left knee 12/18/2020   Elevated brain natriuretic peptide (BNP) level 12/18/2020   Weakness of both lower extremities 12/18/2020   Assistance needed for mobility 12/18/2020   Impaired mobility 12/18/2020   Urinary incontinence 12/18/2020   Pleural effusion on left 12/18/2020   Lumbar radiculopathy 12/11/2020   Acromial process of scapula fracture 12/21/2019   NSIP  (nonspecific interstitial pneumonitis) ([place]) 12/08/2019   Iliopsoas bursitis of right hip 08/29/2019   Chronic left shoulder pain 08/29/2019   Overweight (BMI 25.0-29.9) 08/29/2019   Hypertriglyceridemia 07/24/2019   Bipolar disorder, in full remission, most recent episode depressed ([place]) 07/13/2019   Essential hypertension 06/16/2019   Chronic pain 06/16/2019   Lymphedema 06/05/2019   Chronic venous insufficiency 05/25/2019   PAD (peripheral artery disease) ([place]) 05/25/2019   Leg edema 05/02/2019   Episodic mood disorder ([place]) 96/28/3662   Complicated grief 94/76/5465   At high risk for falls 03/16/2019   Pelvic mass in female 03/16/2019   Compression fracture of L4 vertebra ([place]) 02/21/2019   Collapsed vertebra, not elsewhere classified, lumbar region, initial encounter for fracture ([place]) 02/21/2019   Anxiety 01/20/2019   Pain due to onychomycosis of toenails of both feet 12/05/2018   Hav (hallux abducto valgus), unspecified laterality 12/05/2018   Closed fracture of acromial process of scapula 10/28/2018   Status post reverse arthroplasty of shoulder, left 07/26/2018   Interstitial pulmonary disease ([place]) 07/07/2018   Fatigue 07/07/2018   Avascular necrosis of left shoulder due to adverse effect of steroid therapy ([place]) 01/20/2018   Other shoulder lesions, left shoulder 12/20/2017   Status post reverse total shoulder replacement, right 11/04/2017   Leukocytosis 10/19/2017   Allergic rhinitis 09/28/2017   Rotator cuff tendinitis, right 07/26/2017   Strain of right hip 02/26/2017   History of fracture of left hip 01/15/2017   Status post lumbar spinal fusion 01/15/2017   Dislocation of hip joint prosthesis ([place]) 01/08/2017   Status post hip hemiarthroplasty 01/08/2017   Leg swelling 12/15/2016   Age-related osteoporosis without current pathological fracture 11/05/2016   Hip fracture ([place]) 10/31/2016   Peripheral neuropathy 10/28/2016   Left foot pain 10/19/2016   Spinal  stenosis of lumbar region 09/15/2016   Osteoporosis 08/27/2016   Drug-induced osteoporosis 08/27/2016   Adnexal mass 08/25/2016   Prediabetes 08/25/2016   Coronary atherosclerosis 08/25/2016   History of syncope 08/25/2016   Hypokalemia 08/25/2016   Mixed bipolar I disorder ([place]) 10/09/2015   Stage 3b chronic kidney disease ([place])    Microcytic anemia 08/15/2015   Conversion disorder 08/04/2015   Hyperlipidemia 07/17/2015   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   Macular degeneration 07/17/2015   Insomnia 07/17/2015   Rheumatoid arthritis ([place]) 12/05/2014   Other specified postprocedural states 07/20/2013   Osteoarthritis of knee 06/07/2013   Cough 05/02/2012   SOB (shortness of breath) 10/13/2011   Valvular heart disease 10/13/2011   Chronic obstructive pulmonary disease (COPD) ([place]) 09/09/2011   OSA (obstructive sleep apnea) 09/09/2011   Nocturnal hypoxemia 09/09/2011   Gastroesophageal reflux disease 02/24/2011   Single kidney 02/24/2011   H/O cardiac catheterization 02/24/2011   Renal artery stenosis ([place]) 02/24/2011   Social History   Tobacco Use   Smoking status: Former    Packs/day: 0.50    Years: 20.00    Total pack years: 10.00    Types: Cigarettes    Quit date: 02/02/1974    Years since quitting: 47.9   Smokeless tobacco: Never  Substance  Use Topics   Alcohol use: No   Allergies  Allergen Reactions   Ceftin [Cefuroxime Axetil] Anaphylaxis   Lisinopril Anaphylaxis   Sulfa Antibiotics Other (See Comments)    Face swelling   Sulfasalazine Anaphylaxis   Morphine Other (See Comments)    Per patient, low blood pressure issues that requires action to raise it back up. Can take small infrequent doses   Xarelto [Rivaroxaban] Other (See Comments)    Stomach burning, bleeding, and tar in stool   Adhesive [Tape] Rash    Paper tape and tega derm OK   Antihistamines, Chlorpheniramine-Type Other (See Comments)    Makes pt hyper   Antivert  [Meclizine Hcl] Other (See Comments)    Bladder will not empty   Aspirin Other (See Comments)    Sulfasalazine allergy cross reacts   Contrast Media [Iodinated Contrast Media] Rash    Kerri Carter is able to use betadine scrubs.   Decongestant [Pseudoephedrine Hcl] Other (See Comments)    Makes pt hyper   Doxycycline Other (See Comments)    GI upset   Levaquin [Levofloxacin In D5w] Rash   Polymyxin B Other (See Comments)    Facial rash   Tetanus Toxoids Rash and Other (See Comments)    Fever and hot to touch at injection site   Current Meds  Medication Sig   albuterol (PROVENTIL) (2.5 MG/3ML) 0.083% nebulizer solution Take by nebulization.   albuterol (VENTOLIN HFA) 108 (90 Base) MCG/ACT inhaler Inhale 2 puffs into the lungs every 6 (six) hours as needed for wheezing or shortness of breath.   amLODipine (NORVASC) 5 MG tablet Take 1 tablet (5 mg total) by mouth daily.   apixaban (ELIQUIS) 5 MG TABS tablet Take 1 tablet (5 mg total) by mouth 2 (two) times daily.   budesonide (PULMICORT) 0.25 MG/2ML nebulizer solution USE 1 VIAL  IN  NEBULIZER TWICE  DAILY - rinse mouth after treatment   busPIRone (BUSPAR) 5 MG tablet Take 1 tablet (5 mg total) by mouth 2 (two) times daily.   Cholecalciferol (VITAMIN D3) 50 MCG (2000 UT) TABS Take 4,000 Units by mouth daily.   dicyclomine (BENTYL) 10 MG capsule TAKE 1 CAPSULE (10 MG TOTAL) BY MOUTH 2 (TWO) TIMES DAILY AS NEEDED FOR SPASMS.   escitalopram (LEXAPRO) 10 MG tablet Take 1 tablet (10 mg total) by mouth daily.   famotidine (PEPCID) 20 MG tablet TAKE 1 TABLET (20 MG TOTAL) BY MOUTH DAILY. BEFORE BREAKFAST OR DINNER   ferrous sulfate 325 (65 FE) MG EC tablet Take 1 tablet (325 mg total) by mouth 2 (two) times daily. (Patient taking differently: Take 325 mg by mouth 2 (two) times daily. Pt reports MD instructed her to take twice a day every other day.)   fluticasone (FLONASE) 50 MCG/ACT nasal spray Place 2 sprays into both nostrils daily. In am   gabapentin  (NEURONTIN) 300 MG capsule TAKE 2 CAPSULES (600 MG TOTAL) BY MOUTH IN THE MORNING AND AT BEDTIME.   ipratropium-albuterol (DUONEB) 0.5-2.5 (3) MG/3ML SOLN Take 3 mLs by nebulization every 6 (six) hours as needed.   lamoTRIgine (LAMICTAL) 100 MG tablet TAKE 1 TAB 2 TIMES DAILY. FURTHER REFILLS NEW PSYCH FOR ALL PSYCH MEDS ONLY TEMP SUPPLY FROM PCP   leflunomide (ARAVA) 20 MG tablet Take 1 tablet (20 mg total) by mouth daily.   lidocaine (LIDODERM) 5 % Place 1 patch onto the skin 2 (two) times daily as needed. Remove & Discard patch within 12 hours or as directed by MD (Patient  taking differently: Place 1 patch onto the skin daily. Remove & Discard patch within 12 hours or as directed by MD)   lovastatin (MEVACOR) 20 MG tablet TAKE 1 TABLET BY MOUTH EVERYDAY AT BEDTIME *STOP TALKING '40MG'$ * (Patient taking differently: Take 20 mg by mouth at bedtime.)   methocarbamol (ROBAXIN) 500 MG tablet Take 500 mg by mouth 4 (four) times daily as needed. Taking twice a day currently   montelukast (SINGULAIR) 10 MG tablet TAKE 1 TABLET BY MOUTH EVERY DAY (Patient taking differently: Take 10 mg by mouth daily.)   multivitamin-lutein (OCUVITE-LUTEIN) CAPS capsule Take 1 capsule by mouth at bedtime.   MYRBETRIQ 50 MG TB24 tablet TAKE 1 TABLET BY MOUTH EVERY DAY   nebivolol (BYSTOLIC) 5 MG tablet Take 1 tablet (5 mg total) by mouth in the morning and at bedtime. (Note dose changed from 1/2 10 mg bid to 5 mg bid)   OXYGEN Inhale 2 L into the lungs at bedtime.   pantoprazole (PROTONIX) 40 MG tablet TAKE 1 TABLET BY MOUTH 2 TIMES DAILY 30 MIN BEFORE FOOD (NOTE REDUCTION IN FREQUENCY)   predniSONE (DELTASONE) 5 MG tablet Take 5 mg by mouth every morning.   QUEtiapine (SEROQUEL) 25 MG tablet TAKE 1 TABLET (25 MG TOTAL) BY MOUTH AT BEDTIME. AGAIN LAST FILL FURTHER REFILLS FROM PSYCHIATRY NO EXCEPTIONS   sucralfate (CARAFATE) 1 g tablet TAKE 1 TABLET BY MOUTH 2 TIMES DAILY.   torsemide (DEMADEX) 20 MG tablet Take 1 tablet (20  mg total) by mouth daily.   traMADol (ULTRAM) 50 MG tablet Take by mouth. Take 0.5-1 tablets (25-50 mg total) by mouth every 12 (twelve) hours as needed   zoledronic acid (RECLAST) 5 MG/100ML SOLN injection Inject 5 mg into the vein once.   Immunization History  Administered Date(s) Administered   Fluad Quad(high Dose 65+) 10/03/2018, 10/16/2019, 11/22/2020, 10/21/2021   Influenza Split 10/04/2012, 10/25/2013, 10/28/2016   Influenza Whole 11/03/2011   Influenza, High Dose Seasonal PF 10/04/2015, 11/03/2016   Influenza,inj,Quad PF,6+ Mos 10/31/2014   Influenza-Unspecified 01/02/2010, 11/24/2011, 10/21/2013, 10/31/2014, 09/28/2017   Pneumococcal Conjugate-13 10/21/2013   Pneumococcal Polysaccharide-23 11/07/2015       Objective:   Physical Exam BP 110/70 (BP Location: Right Wrist, Cuff Size: Normal)   Pulse 84   Temp 98.3 F (36.8 C)   Ht 5' 1.5" (1.562 m)   Wt 175 lb (79.4 kg) Comment: last recorded weight.patient in a wheelchair  SpO2 95%   BMI 32.53 kg/m  GENERAL: Well-developed, well-nourished elderly woman, Kerri Carter presents on transport chair today.   HEAD: Normocephalic, atraumatic. EYES: Pupils equal, round, reactive to light.  No scleral icterus. MOUTH: Oral mucosa moist.  No thrush. NECK: Supple. No thyromegaly. Trachea midline. No JVD.  No adenopathy. PULMONARY: Coarse breath sounds throughout, faint end expiratory wheezes noted.  CARDIOVASCULAR: S1 and S2. Regular rate and rhythm.  Grade 2-9/9 systolic ejection murmur left sternal border, seem more prominent. GASTROINTESTINAL: Benign MUSCULOSKELETAL: Mild changes of RA both hands, no clubbing, no edema.  Limited range of motion of joints due to RA. NEUROLOGIC: No overt focal deficits.  Awake, alert, speech is fluent. SKIN: Intact,warm,dry. PSYCH: Mood and behavior appropriate  Lab Results  Component Value Date   NITRICOXIDE 33 01/01/2022      Assessment & Plan:     ICD-10-CM   1. Asthma-COPD overlap syndrome   J44.89 Nitric oxide   Continue DuoNeb ID Increase Pulmicort strength to 0.5 mg twice daily Continue as needed albuterol    2. COPD  with exacerbation ([place])  J44.1    Medrol Dosepak No evidence for antibiotic at this time    3. Chronic respiratory failure with hypoxia (HCC)  J96.11    Continue oxygen at 2 L/min    4. NSIP (nonspecific interstitial pneumonitis) (HCC)  J84.89    Issue adds complexity to her management Related to her rheumatoid arthritis diagnosis     Orders Placed This Encounter  Procedures   Nitric oxide   Meds ordered this encounter  Medications   budesonide (PULMICORT) 0.5 MG/2ML nebulizer solution    Sig: Take 2 mLs (0.5 mg total) by nebulization 2 (two) times daily.    Dispense:  120 mL    Refill:  11   methylPREDNISolone (MEDROL DOSEPAK) 4 MG TBPK tablet    Sig: Take as directed in the package.    Dispense:  21 tablet    Refill:  0   Will see the patient in follow-up in 2 to 3 months time Kerri Carter is to contact us prior to that time should any new problems arise.  Is also to let us know if Kerri Carter does not improve with the interventions prescribed above.  Renold Don, MD Advanced Bronchoscopy PCCM [facility]    *This note was dictated using voice recognition software/Dragon.  Despite best efforts to proofread, errors can occur which can change the meaning. Any transcriptional errors that result from this process are unintentional and may not be fully corrected at the time of dictation.

## 2022-01-01 NOTE — Patient Instructions (Signed)
We are increasing the strength of your Pulmicort (budesonide).  Continue to use it twice a day.  Continue using the DuoNeb (ipratropium/albuterol) up to 4 times a day as needed.  Definitely use it before you use the Pulmicort at least twice a day.  I sent a prescription to your pharmacy for a Medrol Dosepak this is a taper prednisone like medication.  Stop your prednisone while you are taking the Dosepak and then resume once you finish the Dosepak.  You should get an RSV shot.  I recommend you wait till after you finish your Medrol Dosepak.  [facility] or [facility].  We will see you in follow-up in 2 to 3 months time call sooner should any new problems arise.

## 2022-01-02 ENCOUNTER — Encounter: Payer: Self-pay | Admitting: Pulmonary Disease

## 2022-01-05 NOTE — Telephone Encounter (Signed)
Noted. She should have gotten a letter and can appeal to her insurance if she would like.

## 2022-01-06 ENCOUNTER — Encounter: Payer: Self-pay | Admitting: Family Medicine

## 2022-01-06 ENCOUNTER — Ambulatory Visit: Payer: Medicare PPO | Admitting: Family Medicine

## 2022-01-06 VITALS — BP 134/76 | HR 78 | Temp 99.0°F | Ht 61.5 in | Wt 175.0 lb

## 2022-01-06 DIAGNOSIS — T50905A Adverse effect of unspecified drugs, medicaments and biological substances, initial encounter: Secondary | ICD-10-CM

## 2022-01-06 DIAGNOSIS — M069 Rheumatoid arthritis, unspecified: Secondary | ICD-10-CM

## 2022-01-06 DIAGNOSIS — F3176 Bipolar disorder, in full remission, most recent episode depressed: Secondary | ICD-10-CM

## 2022-01-06 DIAGNOSIS — M818 Other osteoporosis without current pathological fracture: Secondary | ICD-10-CM

## 2022-01-06 DIAGNOSIS — R059 Cough, unspecified: Secondary | ICD-10-CM

## 2022-01-06 DIAGNOSIS — G8921 Chronic pain due to trauma: Secondary | ICD-10-CM | POA: Diagnosis not present

## 2022-01-06 DIAGNOSIS — I10 Essential (primary) hypertension: Secondary | ICD-10-CM

## 2022-01-06 DIAGNOSIS — F419 Anxiety disorder, unspecified: Secondary | ICD-10-CM

## 2022-01-06 DIAGNOSIS — I480 Paroxysmal atrial fibrillation: Secondary | ICD-10-CM

## 2022-01-06 DIAGNOSIS — I739 Peripheral vascular disease, unspecified: Secondary | ICD-10-CM

## 2022-01-06 DIAGNOSIS — J449 Chronic obstructive pulmonary disease, unspecified: Secondary | ICD-10-CM

## 2022-01-06 MED ORDER — GABAPENTIN 300 MG PO CAPS: ORAL_CAPSULE | ORAL | Status: DC

## 2022-01-06 NOTE — Progress Notes (Signed)
SUBJECTIVE:   Chief Complaint  Patient presents with   Establish Care    Transfer of Care-Dr. Olivia Mackie   HPI Patient presents to clinic to transfer care.  No acute concerns.  Chronic pain. Patient follows with orthopedics [facility] at [facility] for left shoulder pain.  Currently takes gabapentin 1200 mg twice daily, methocarbamol 500 mg 3 times daily, tramadol 50 mg every 12 as needed.  Reports continued left shoulder pain.  Reports orthopedics was placing pain management referral.   Hypertension/A-fib/CAD/PAD/mitral valve regurgitation/aortic valve insufficiency/dyslipidemia Asymptomatic.  Compliant with medications.  Takes amlodipine 5 mg daily, Bystolic twice daily, torsemide 20 mg daily, Mevacor 20 mg daily, Eliquis 5 mg twice daily.  Follows with cardiology at [place] with psychiatry.  Currently taking Celexa 10 mg daily, BuSpar 5 mg twice daily, Lamictal 100 mg twice daily, Seroquel 25 mg at night.  Denies any SI/HI.  COPD Oxygen dependent on 3 L nasal cannula.  Long-term Caroid use, prednisone 5 mg daily.  Follows with pulmonology.  Osteopenia Received Reclast injections monthly   PERTINENT PMH / PSH: Hypertension Chronic pain Conversion disorder DDD Cognitive impairment IDA Nonrheumatic mitral regurgitation/aortic valve insufficiency   OBJECTIVE:  BP 134/76   Pulse 78   Temp 99 F (37.2 C)   Ht 5' 1.5" (1.562 m)   Wt 175 lb (79.4 kg)   SpO2 97%   BMI 32.53 kg/m    Physical Exam Vitals reviewed.  Constitutional:      General: She is not in acute distress.    Appearance: She is not ill-appearing.  HENT:     Head: Normocephalic.     Nose: Nose normal.  Eyes:     Conjunctiva/sclera: Conjunctivae normal.  Cardiovascular:     Rate and Rhythm: Normal rate and regular rhythm.     Heart sounds: Normal heart sounds.  Pulmonary:     Effort: Pulmonary effort is normal.     Breath sounds: Normal breath sounds.  Abdominal:      General: Abdomen is flat. Bowel sounds are normal.     Palpations: Abdomen is soft.  Musculoskeletal:        General: Tenderness (left shoulder) present. No swelling, deformity (decreased secondary to pain in left shoulder) or signs of injury.     Cervical back: Normal range of motion.     Right lower leg: No edema.     Left lower leg: No edema.  Skin:    General: Skin is warm.     Capillary Refill: Capillary refill takes less than 2 seconds.  Neurological:     General: No focal deficit present.     Mental Status: She is alert and oriented to person, place, and time. Mental status is at baseline.     Motor: No weakness.  Psychiatric:        Mood and Affect: Mood normal.        Behavior: Behavior normal.        Thought Content: Thought content normal.        Judgment: Judgment normal.     ASSESSMENT/PLAN:  Chronic pain due to trauma Assessment & Plan: Chronic pain multiple sites.  Follows with Ortho emerge. Can continue Robaxin, steroid pack, tramadol as previously prescribed.  Recommend decreasing gabapentin to maximum dose 900 mg daily for creatinine clearance less than 40 Can trial Tylenol 1 g in a.m., 500 mg lunchtime and 1 g p.m. max dose 3 g / 24 hours.  LFTs normal Follow-up with  Ortho emerge if symptoms do not improve  Orders: -     Gabapentin; Take 300 mg in the morning and 600 mg at night.  Bipolar disorder, in full remission, most recent episode depressed [facility]) Assessment & Plan: Follows with psychiatry.   Currently taking BuSpar 5 mg twice daily Lamictal 100 mg twice daily Quetiapine 25 mg at night Request patient follow-up with psychiatry for further refills and management of medications.   Rheumatoid arthritis of wrist, unspecified laterality, unspecified whether rheumatoid factor present [facility]) Assessment & Plan: Follows with rheumatology at [facility].  Currently on leflunomide 20 mg daily    Drug-induced osteoporosis Assessment & Plan: Chronic steroid  use, prednisone 5 mg daily.   Receives Reclast 5 mg injections monthly   Chronic obstructive pulmonary disease, unspecified COPD type ([place]) Assessment & Plan: Follows with pulmonology.  Chronic steroid use, prednisone 5 mg daily.  Oxygen dependent, 3 L nasal cannula.   Paroxysmal atrial fibrillation [facility]) Assessment & Plan: Follows with cardiology.  Currently taking Eliquis 5 mg twice daily, Bystolic 5 mg twice daily   PAD (peripheral artery disease) ([place]) Assessment & Plan: Follows with cardiology.  Currently on Mevacor 20 mg daily and Eliquis.   Essential hypertension Assessment & Plan: Well-controlled per JNC 8 guidelines for age goal less than 140/90 Continue amlodipine 5 mg daily and Bystolic 5 mg twice daily    PDMP reviewed  Return in about 3 months (around 04/07/2022) for PCP.  Carollee Leitz, MD

## 2022-01-06 NOTE — Patient Instructions (Addendum)
It was a pleasure meeting you today. Thank you for allowing me to take part in your health care.  Our goals for today as we discussed include:  Call Orthopedics to check on pain management referral  Increase Tylenol to 1000 mg in morning, 500 mg at lunch and 1000 mg before bedtime Heat/ice as needed Over the counter medication Can use Biofreeze 3-4 times a day as needed.   Cannot increase Gabapentin as maximum dose is 900 mg a day for your kidney function.  Decrease to 300 mg in the morning and 600 mg at night  If you have any questions or concerns, please do not hesitate to call the office at [phone].  I look forward to our next visit and until then take care and stay safe.  Regards,   Carollee Leitz, MD   [facility]

## 2022-01-07 ENCOUNTER — Telehealth: Payer: Self-pay

## 2022-01-07 NOTE — Telephone Encounter (Signed)
Patient reports that the gemtesa is working well and she would like to get a prescription sent to the pharmacy.

## 2022-01-08 MED ORDER — GEMTESA 75 MG PO TABS: 75.0000 mg | ORAL_TABLET | Freq: Every day | ORAL | 3 refills | Status: DC

## 2022-01-08 NOTE — Telephone Encounter (Signed)
[facility] [place]. Please schedule her for annual OAB follow up with me with [facility].

## 2022-01-08 NOTE — Telephone Encounter (Signed)
Spoke with patient and advised results Appt scheduled 

## 2022-01-11 IMAGING — CR DG CHEST 2V
1 series · 2 of 2 positions shown · non-contrast
Comparison: Chest radiograph dated December 04, 2020

CLINICAL DATA: Productive cough, hemoptysis.

EXAM:
CHEST - 2 VIEW

[Series 1: dg chest 2 view · 0.14mm/px · 2 of 2 slices shown]
[im 1/2]
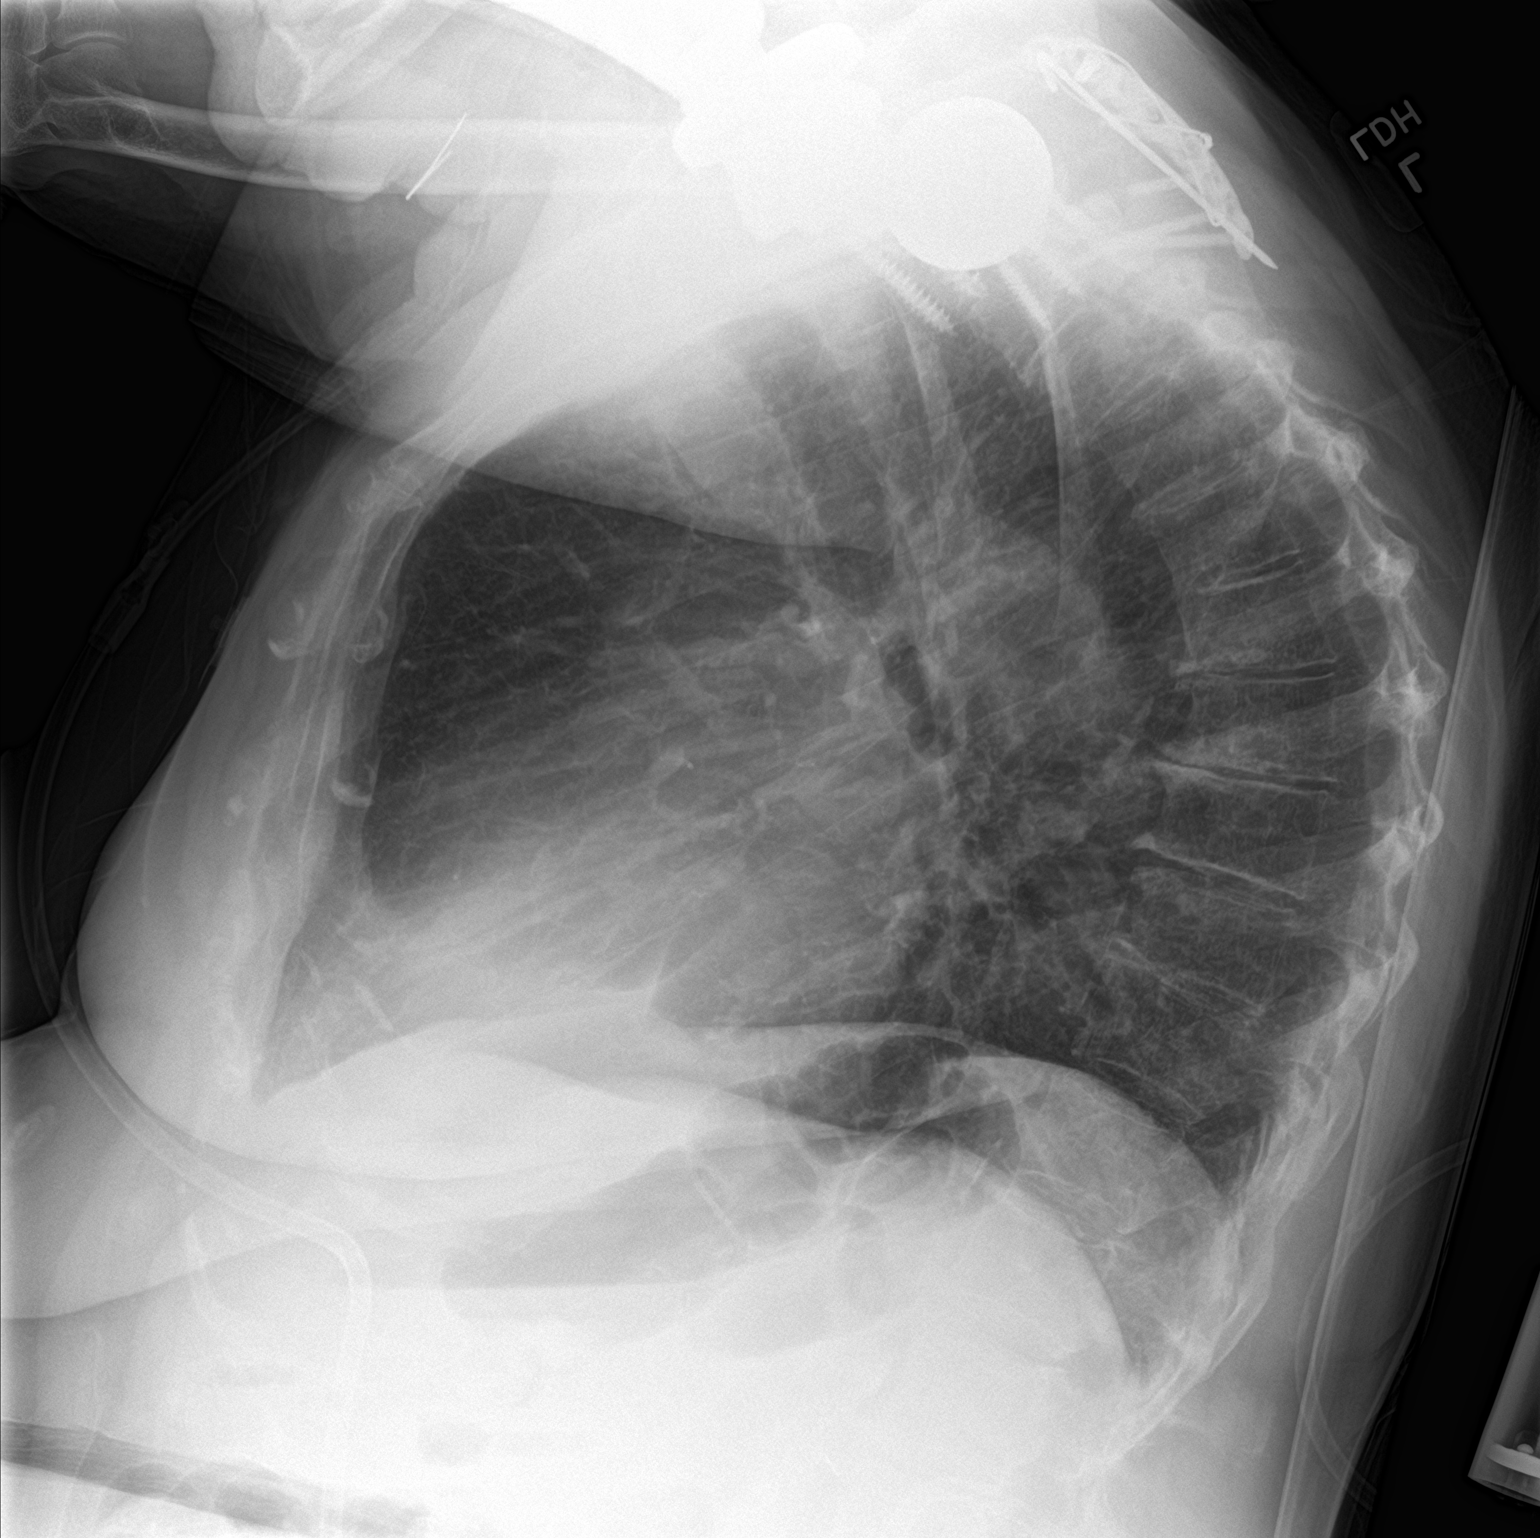
[im 2/2]
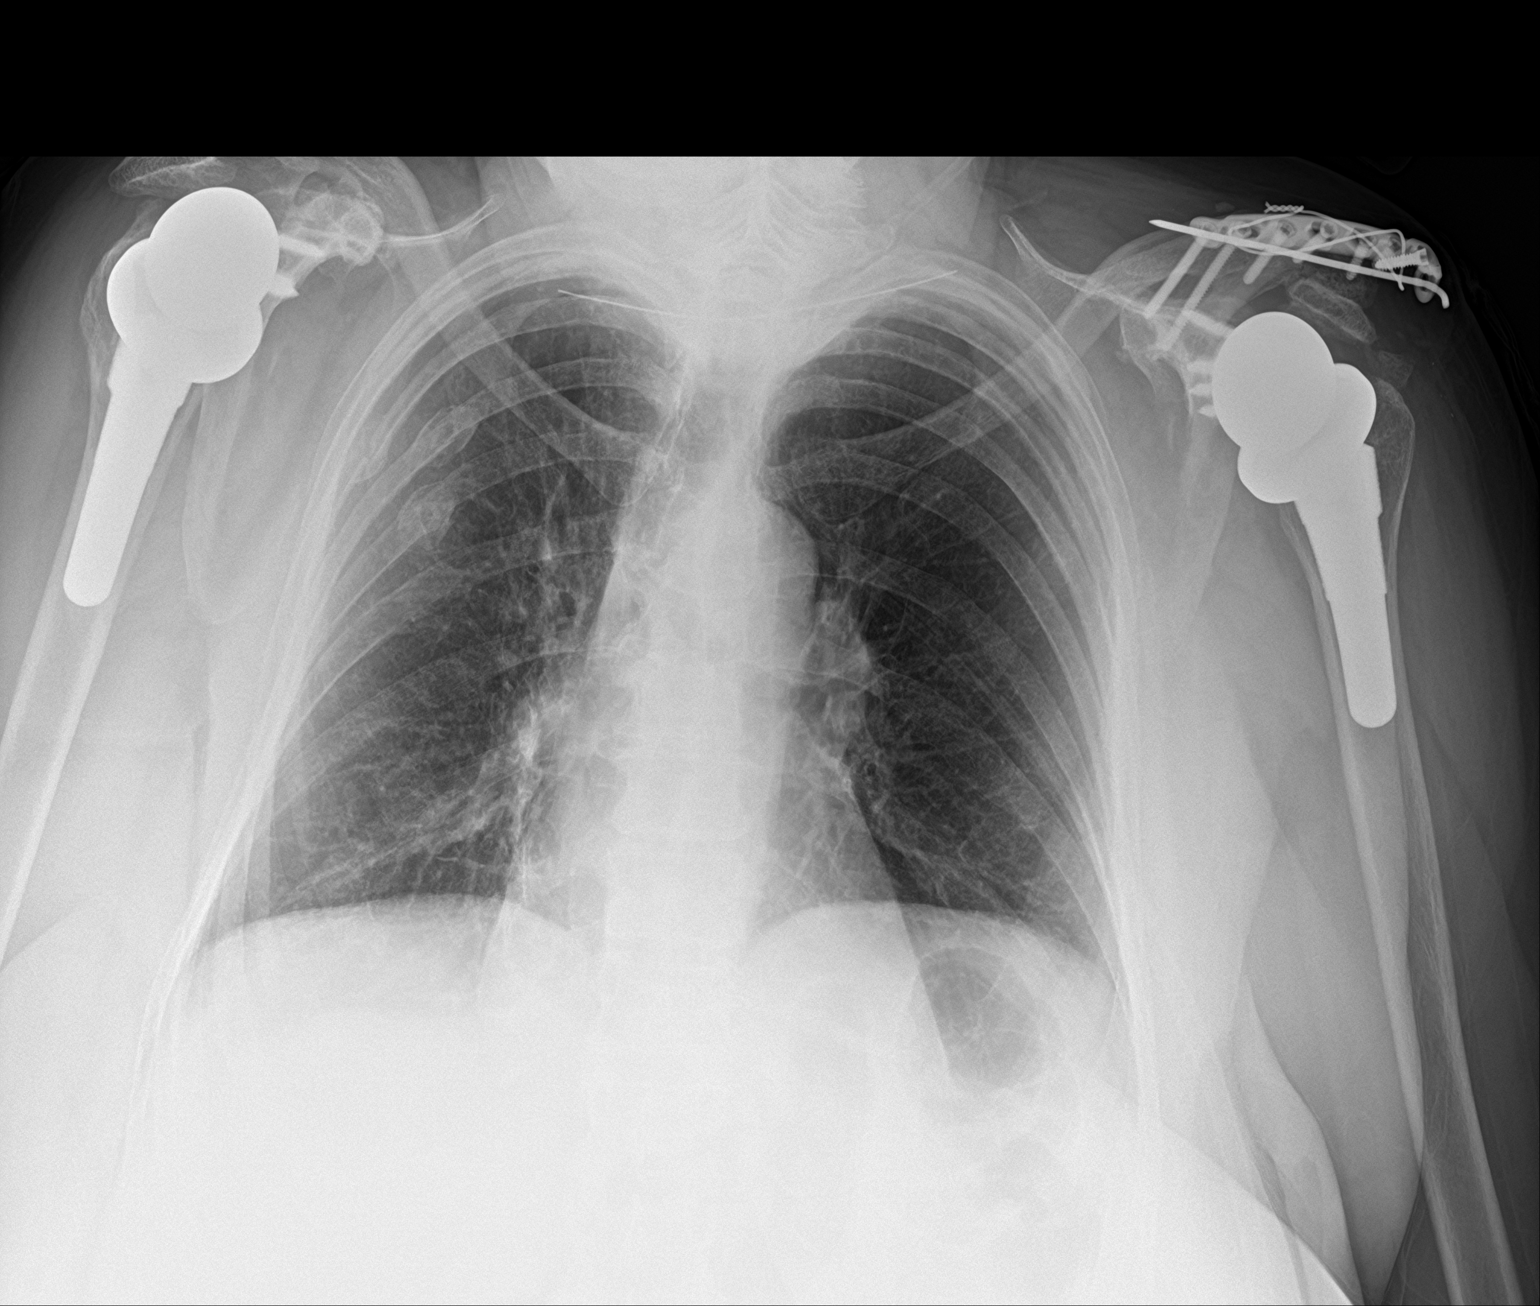

[2 of 2 positions shown; findings below may reference images not displayed]

FINDINGS: The heart size and mediastinal contours are within normal limits.
Hyperinflated lungs with increase in AP diameter of the chest. No
focal consolidation or pleural effusion. Chronic healed right-sided
rib fractures. Bilateral shoulder arthroplasty.
IMPRESSION: COPD without evidence of acute cardiopulmonary process.

## 2022-01-13 NOTE — Telephone Encounter (Signed)
MyChart messgae sent to patient. 

## 2022-01-18 ENCOUNTER — Encounter: Payer: Self-pay | Admitting: Family Medicine

## 2022-01-18 NOTE — Assessment & Plan Note (Signed)
Follows with cardiology.  Currently on Mevacor 20 mg daily and Eliquis.

## 2022-01-18 NOTE — Assessment & Plan Note (Signed)
Follows with psychiatry.   Currently taking BuSpar 5 mg twice daily Lamictal 100 mg twice daily Quetiapine 25 mg at night Request patient follow-up with psychiatry for further refills and management of medications.

## 2022-01-18 NOTE — Assessment & Plan Note (Addendum)
Chronic steroid use, prednisone 5 mg daily.   Receives Reclast 5 mg injections monthly

## 2022-01-18 NOTE — Assessment & Plan Note (Signed)
Well-controlled per JNC 8 guidelines for age goal less than 140/90 Continue amlodipine 5 mg daily and Bystolic 5 mg twice daily

## 2022-01-18 NOTE — Assessment & Plan Note (Signed)
Follows with cardiology.  Currently taking Eliquis 5 mg twice daily, Bystolic 5 mg twice daily

## 2022-01-18 NOTE — Assessment & Plan Note (Addendum)
Follows with pulmonology.  Chronic steroid use, prednisone 5 mg daily.  Oxygen dependent, 3 L nasal cannula.

## 2022-01-18 NOTE — Assessment & Plan Note (Signed)
Chronic pain multiple sites.  Follows with [facility]. Can continue Robaxin, steroid pack, tramadol as previously prescribed.  Recommend decreasing gabapentin to maximum dose 900 mg daily for creatinine clearance less than 40 Can trial Tylenol 1 g in a.m., 500 mg lunchtime and 1 g p.m. max dose 3 g / 24 hours.  LFTs normal Follow-up with [facility] if symptoms do not improve

## 2022-01-18 NOTE — Assessment & Plan Note (Signed)
Follows with rheumatology at [place].  Currently on leflunomide 20 mg daily

## 2022-02-09 IMAGING — CT CT CERVICAL SPINE W/O CM
3 of 4 series · 12 of 35 positions shown, 14 images · non-contrast
Comparison: None.

CLINICAL DATA: Neck trauma (Age >= 65y)

EXAM:
CT CERVICAL SPINE WITHOUT CONTRAST
TECHNIQUE: Multidetector CT imaging of the cervical spine was performed without
intravenous contrast. Multiplanar CT image reconstructions were also
generated.

[Series 6: orthogonal bone · axial · 0.30mm/px · z∈[+1488,+1626]mm · 4 of 109 slices shown, 5 images]
[im 16/109  soft-tissue]
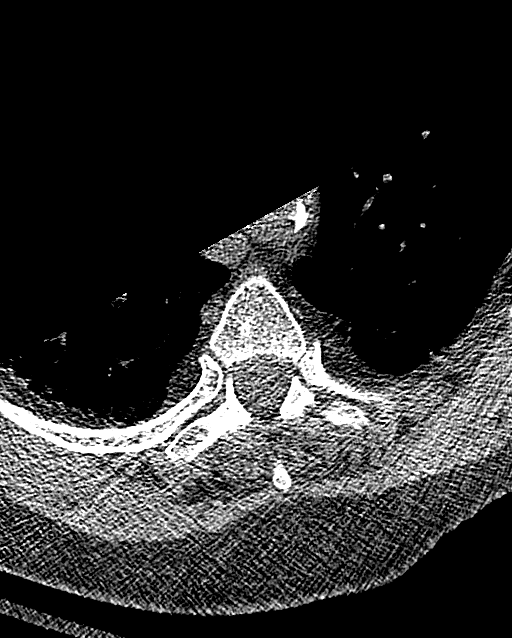
[im 16/109  bone]
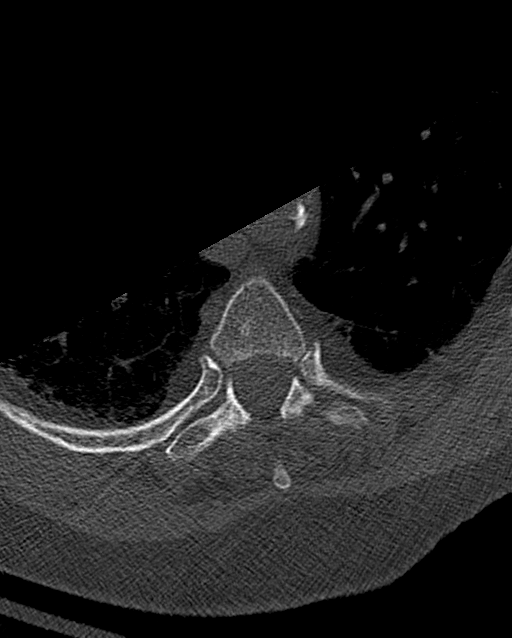
[im 47/109  bone]
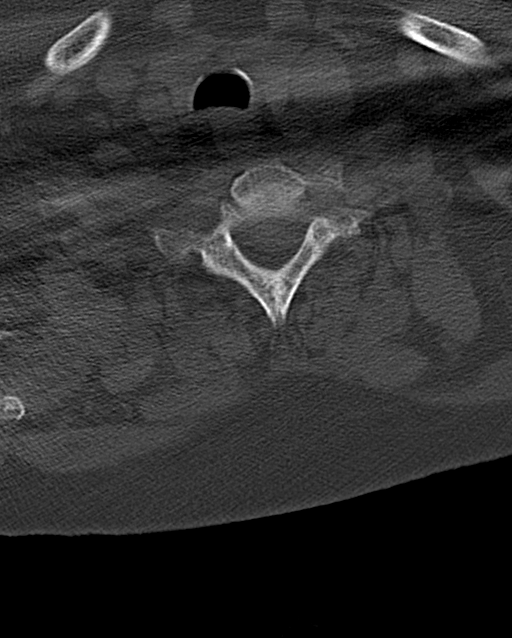
[im 62/109  bone]
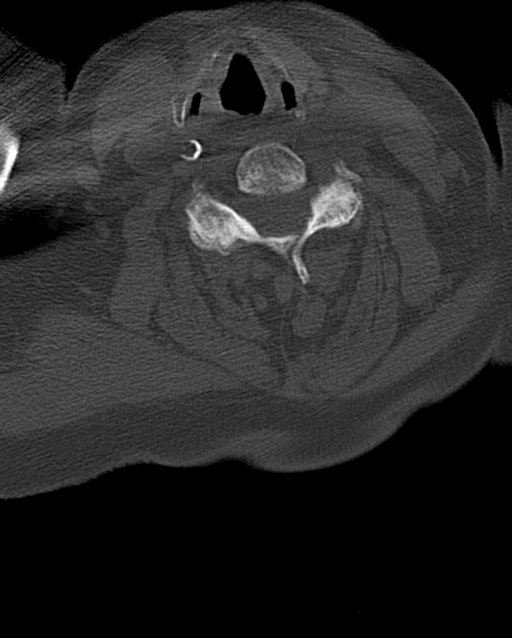
[im 93/109  bone]
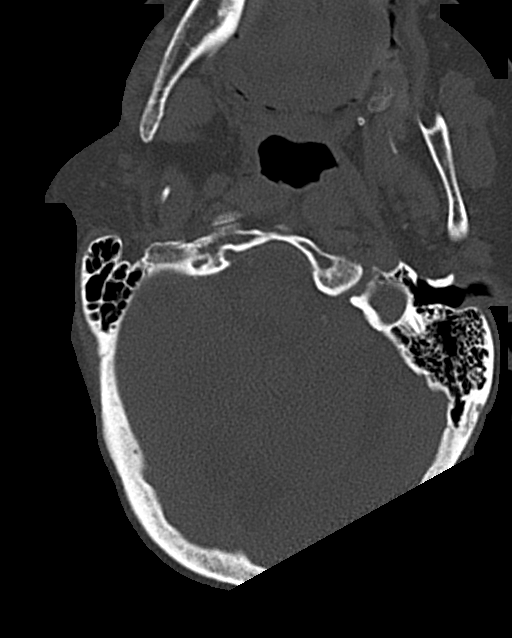

[Series 7: sagittal bone · sagittal · 0.33mm/px · 5 of 85 slices shown, 6 images]
[im 29/85  bone]
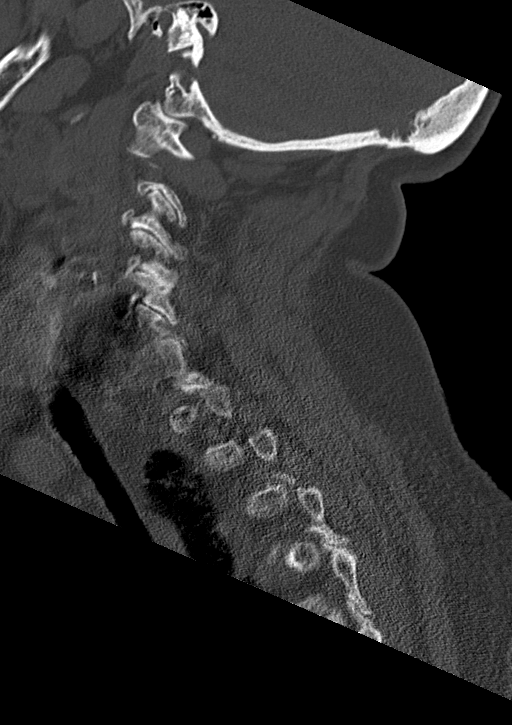
[im 36/85  bone]
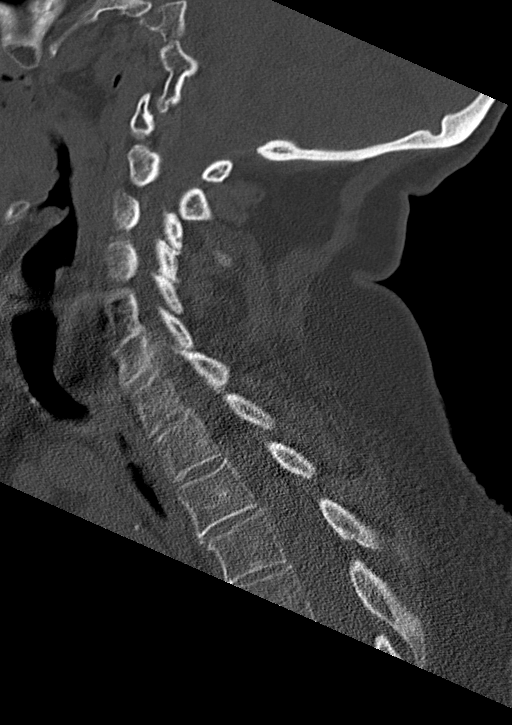
[im 43/85  soft-tissue]
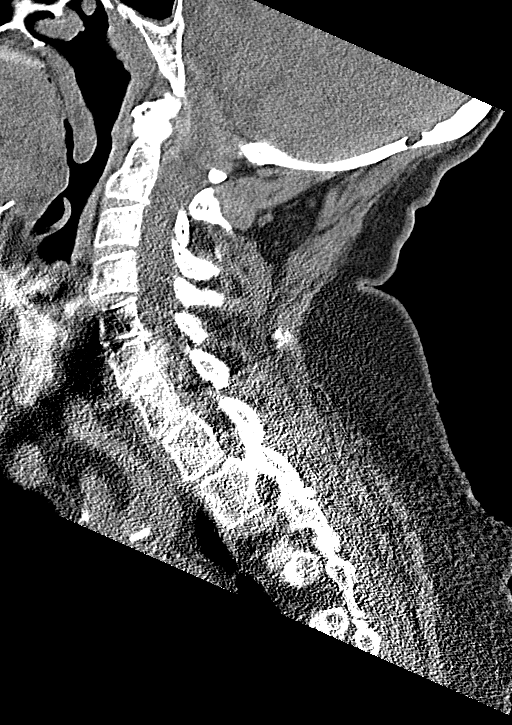
[im 43/85  bone]
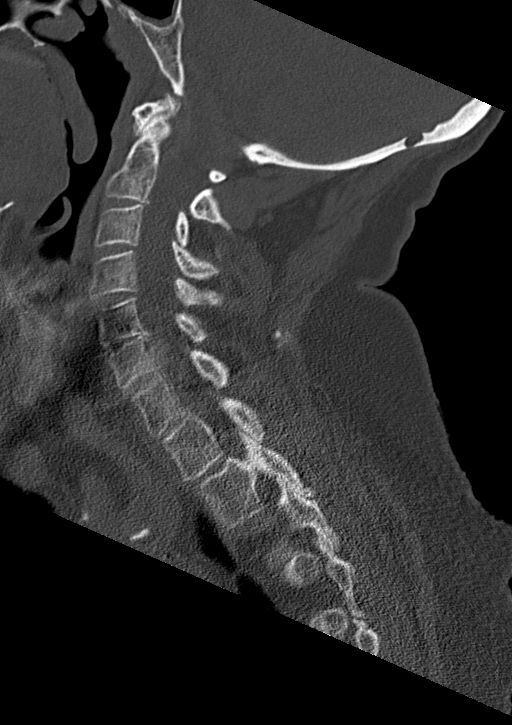
[im 50/85  bone]
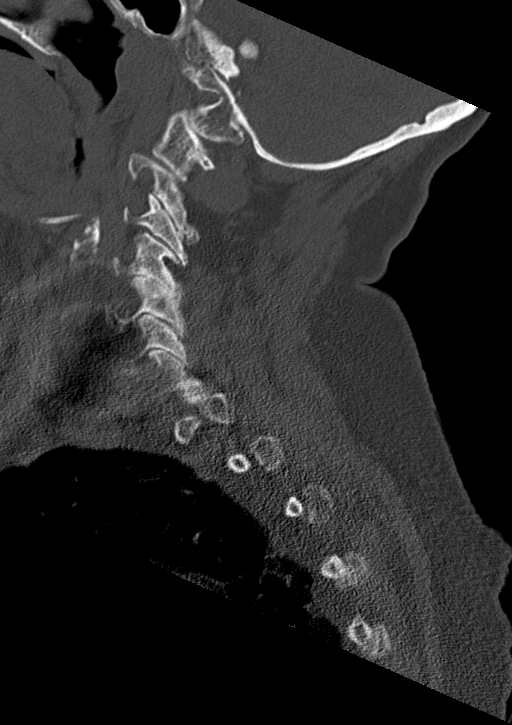
[im 57/85  bone]
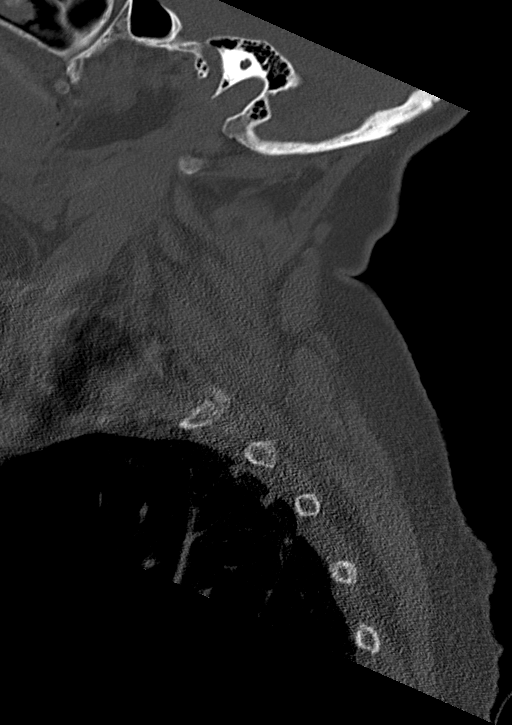

[Series 8: coronal bone · coronal · 0.34mm/px · 3 of 87 slices shown]
[im 27/87  bone]
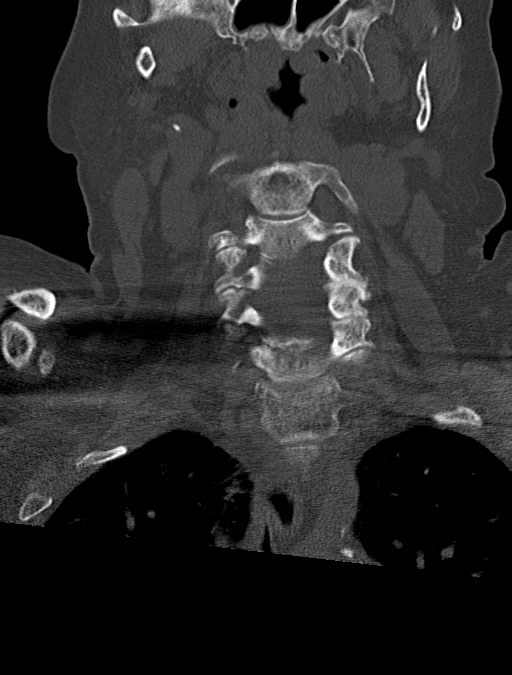
[im 38/87  bone]
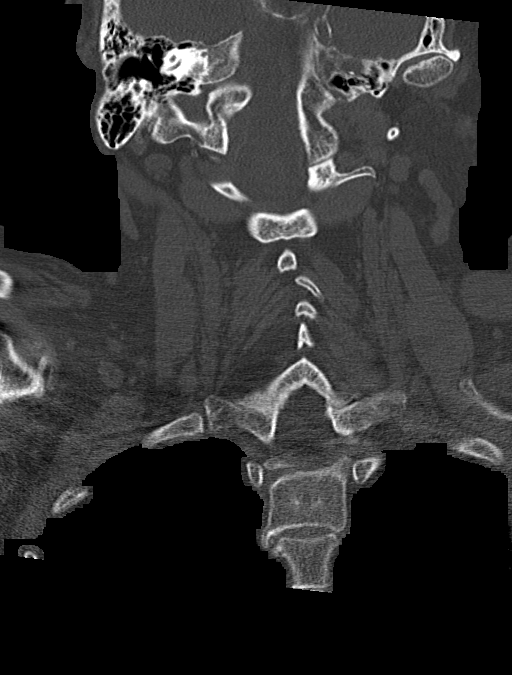
[im 49/87  bone]
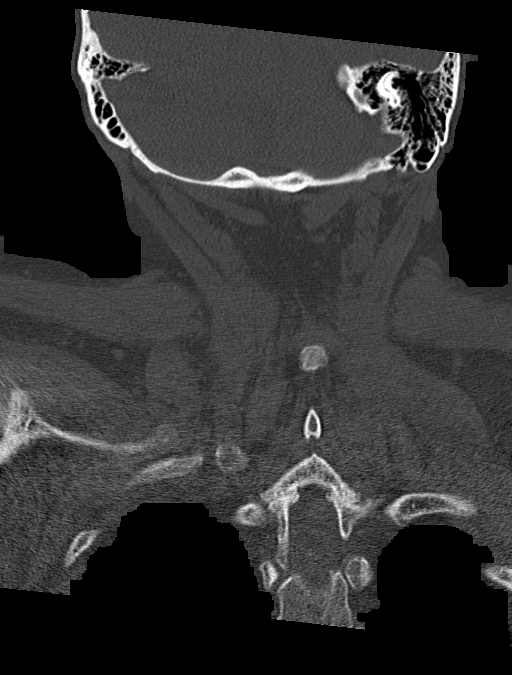

[12 of 35 positions shown; findings below may reference images not displayed]

FINDINGS: Alignment: Normal

Skull base and vertebrae: No acute fracture. No primary bone lesion
or focal pathologic process.

Soft tissues and spinal canal: No prevertebral fluid or swelling. No
visible canal hematoma.

Disc levels: Diffuse degenerative disc disease. Moderate to advanced
bilateral degenerative facet disease, right greater than left.

Upper chest: Biapical scarring.

Other: None
IMPRESSION: Degenerative disc and facet disease.  No acute bony abnormality.

## 2022-02-09 IMAGING — CT CT RENAL STONE PROTOCOL
2 of 4 series · 16 of 46 positions shown, 18 images · non-contrast
Comparison: None.

CLINICAL DATA: Flank pain.  Concern for kidney stone.

EXAM:
CT ABDOMEN AND PELVIS WITHOUT CONTRAST
TECHNIQUE: Multidetector CT imaging of the abdomen and pelvis was performed
following the standard protocol without IV contrast.

[Series 2: stone full standard (person_name) · axial · 0.91mm/px · z∈[+964,+1404]mm · 13 of 96 slices shown, 15 images]
[im 4/96  soft-tissue]
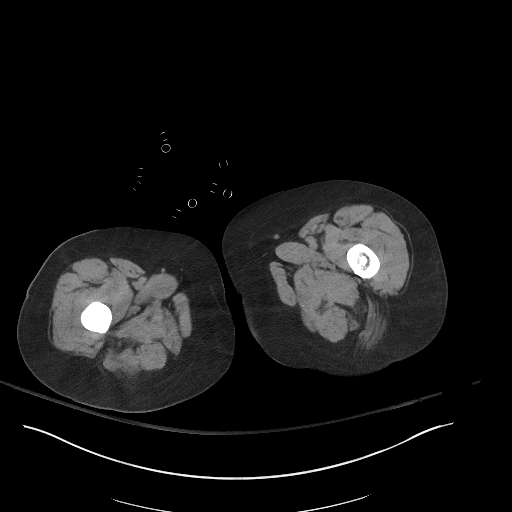
[im 4/96  bone]
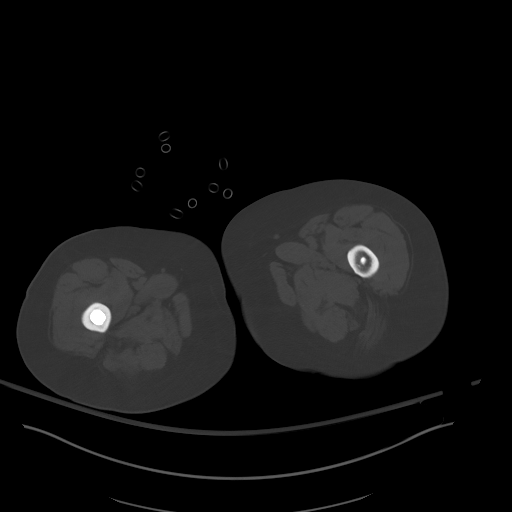
[im 11/96  soft-tissue]
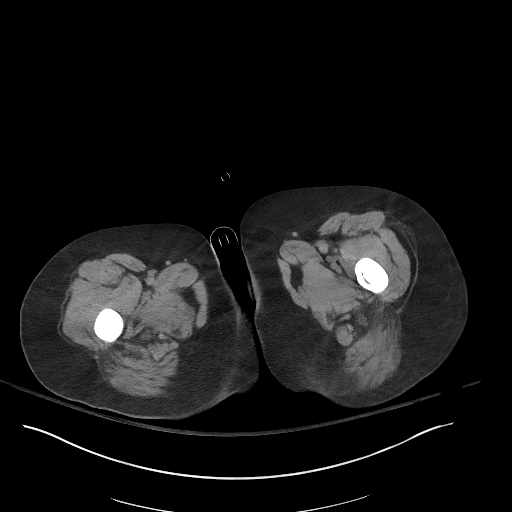
[im 19/96  soft-tissue]
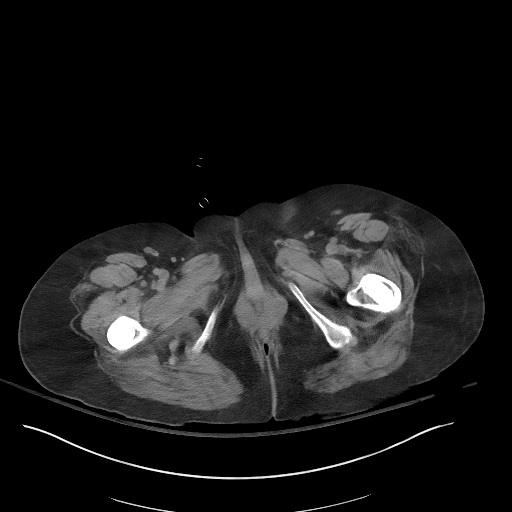
[im 26/96  soft-tissue]
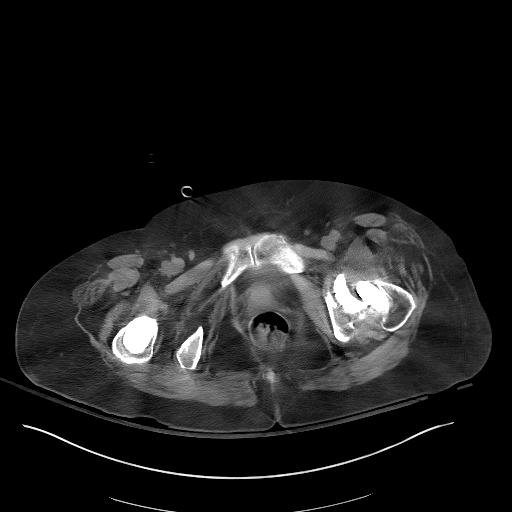
[im 33/96  soft-tissue]
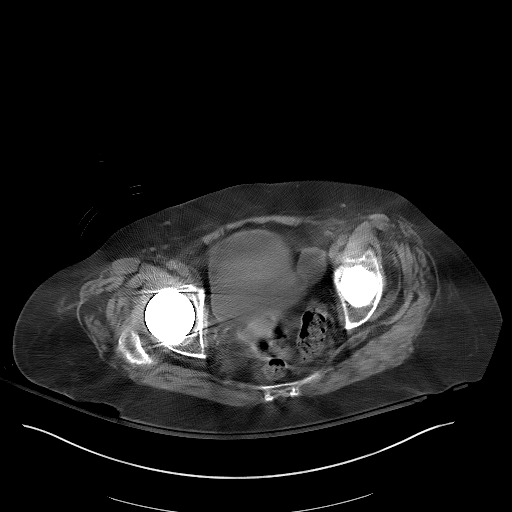
[im 41/96  soft-tissue]
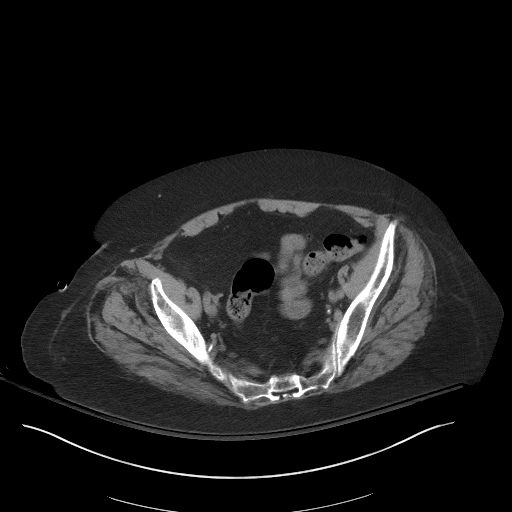
[im 48/96  soft-tissue]
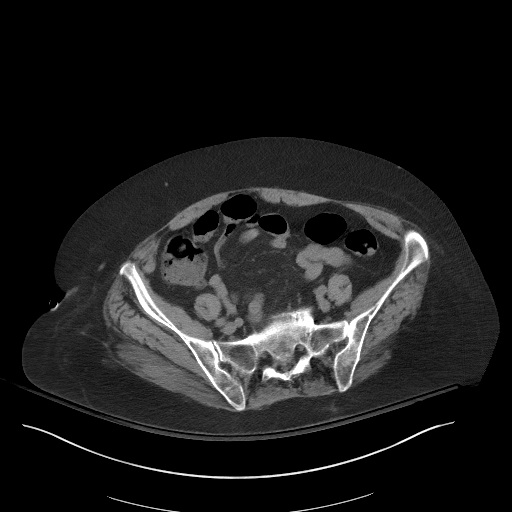
[im 55/96  soft-tissue]
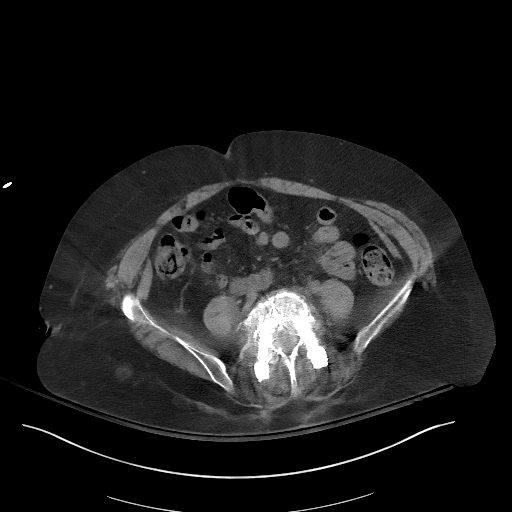
[im 63/96  soft-tissue]
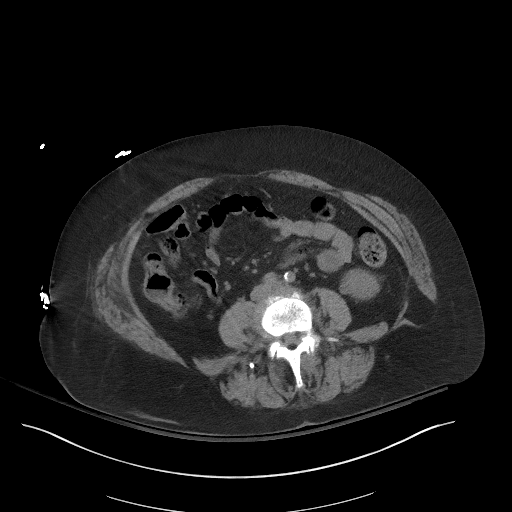
[im 63/96  bone]
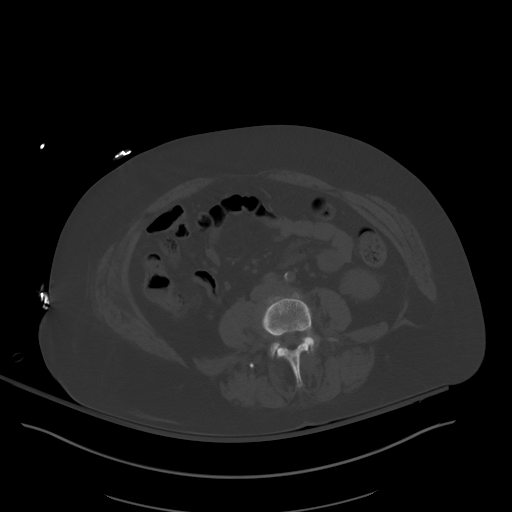
[im 70/96  soft-tissue]
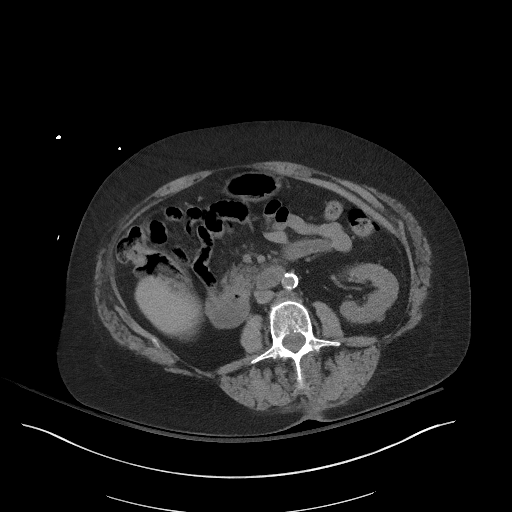
[im 77/96  soft-tissue]
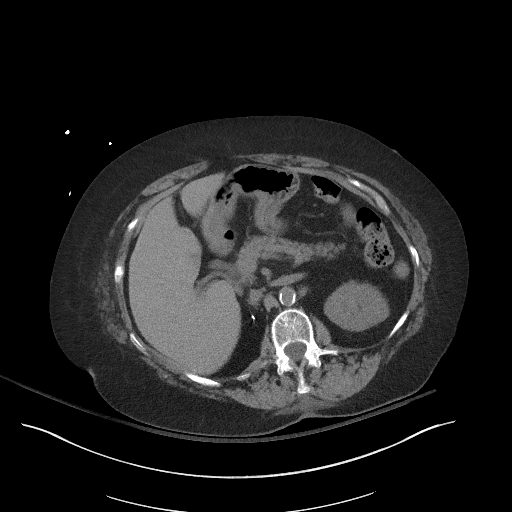
[im 85/96  soft-tissue]
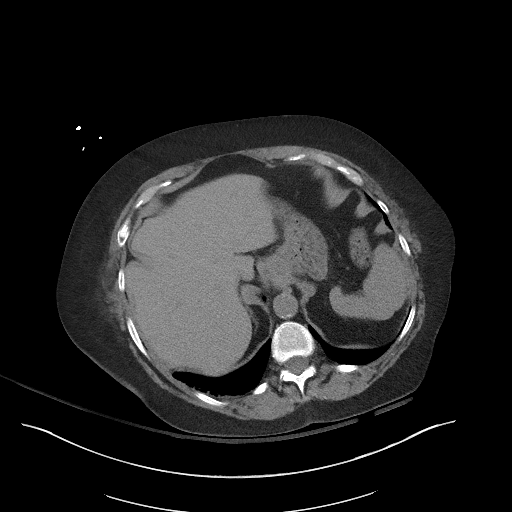
[im 92/96  soft-tissue]
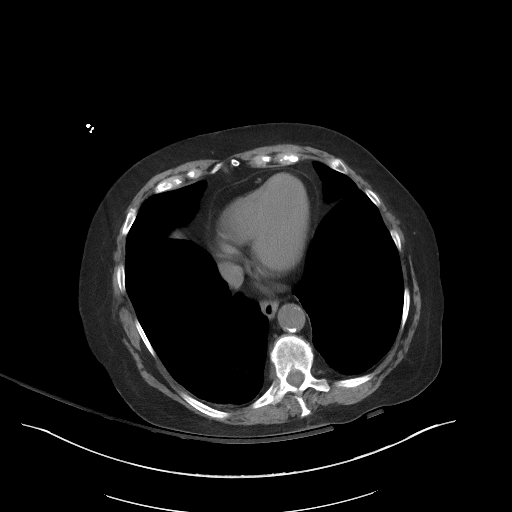

[Series 5: coronal (person_name) · coronal · 0.87mm/px · 3 of 151 slices shown]
[im 51/151  soft-tissue]
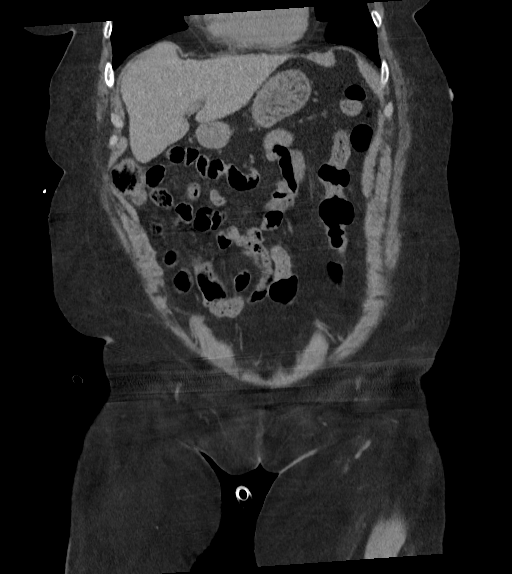
[im 67/151  soft-tissue]
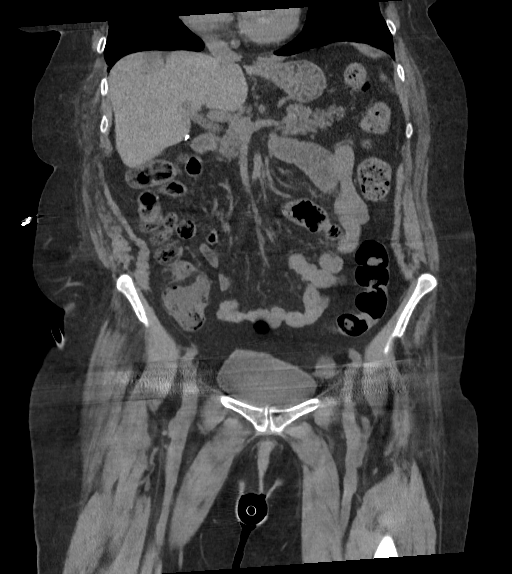
[im 84/151  soft-tissue]
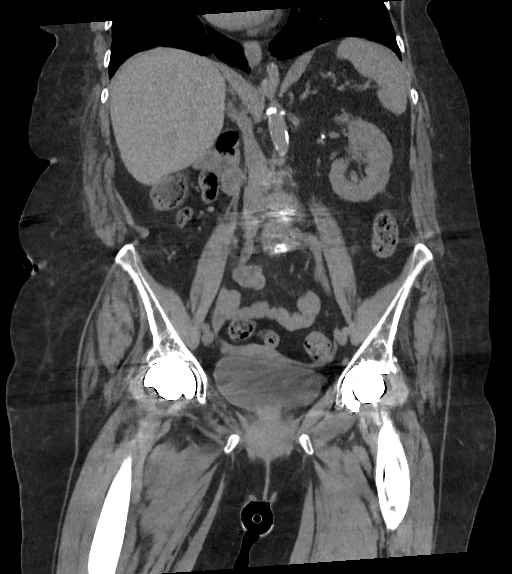

[16 of 46 positions shown; findings below may reference images not displayed]

FINDINGS: Evaluation of this exam is limited in the absence of intravenous
contrast.

Lower chest: The visualized lung bases are clear. There is
calcification of the mitral annulus.

No intra-abdominal free air or free fluid.

Hepatobiliary: The liver is unremarkable. No intrahepatic biliary
ductal dilatation. Cholecystectomy.

Pancreas: Unremarkable. No pancreatic ductal dilatation or
surrounding inflammatory changes.

Spleen: Normal in size without focal abnormality.

Adrenals/Urinary Tract: The adrenal glands are unremarkable. Status
post prior right nephrectomy. There is no hydronephrosis or
nephrolithiasis on the left. Subcentimeter left renal lesions are
too small to characterize. The left ureter and urinary bladder
appear unremarkable.

Stomach/Bowel: There is no bowel obstruction or active inflammation.
Appendectomy.

Vascular/Lymphatic: Moderate aortoiliac atherosclerotic disease. The
IVC is unremarkable. No portal venous gas. There is no adenopathy.

Reproductive: The uterus is poorly visualized. There is a 3 cm left
ovarian cyst similar to the CT of 02/21/2019. No follow-up imaging
recommended. Note: This recommendation does not apply to
premenarchal patients and to those with increased risk (genetic,
family history, elevated tumor markers or other high-risk factors)
of ovarian cancer. Reference: JACR [DATE]):[id number]

Other: None

Musculoskeletal: Osteopenia with degenerative changes of the spine.
Lower lumbar fusion none bilateral hip arthroplasties. No acute
osseous pathology.
IMPRESSION: 1. No acute intra-abdominal or pelvic pathology. No hydronephrosis
or nephrolithiasis.
2. Status post prior right nephrectomy.
3. Aortic Atherosclerosis ([id number]).

## 2022-02-09 IMAGING — CT CT HEAD W/O CM
4 series · 17 of 47 positions shown, 19 images · non-contrast
Comparison: 11/20/2020

CLINICAL DATA: Mental status change, unknown cause.

EXAM:
CT HEAD WITHOUT CONTRAST
TECHNIQUE: Contiguous axial images were obtained from the base of the skull
through the vertex without intravenous contrast.

[Series 2: head wo · axial · 0.43mm/px · z∈[+1692,+1812]mm · 7 of 33 slices shown, 9 images]
[im 5/33  brain]
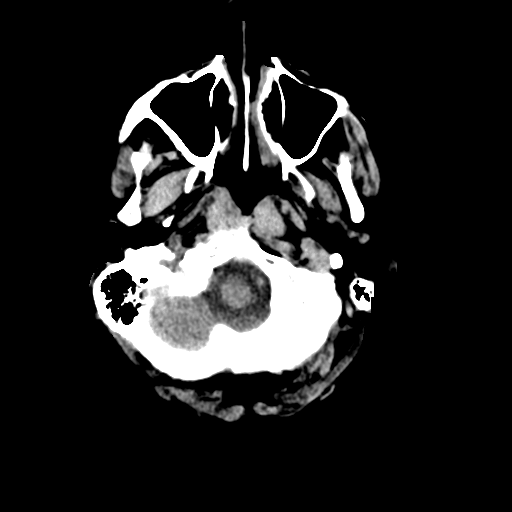
[im 5/33  bone]
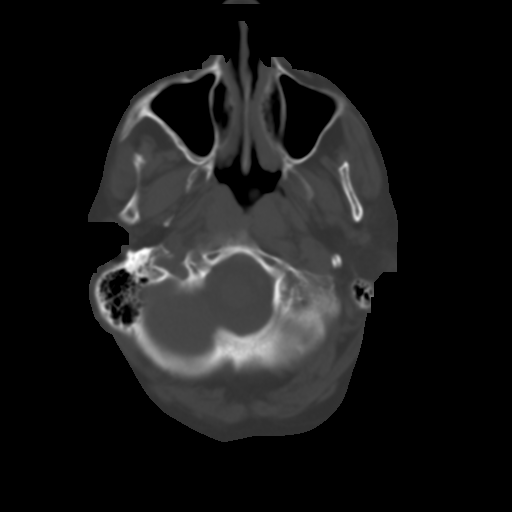
[im 9/33  brain]
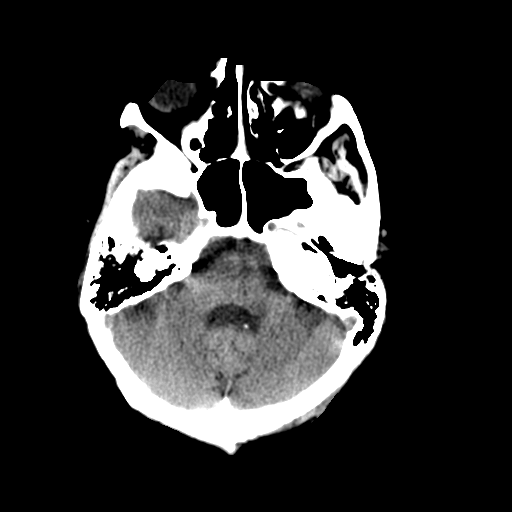
[im 13/33  brain]
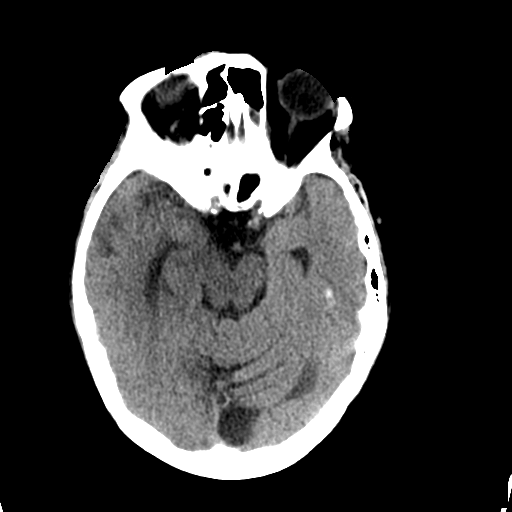
[im 17/33  brain]
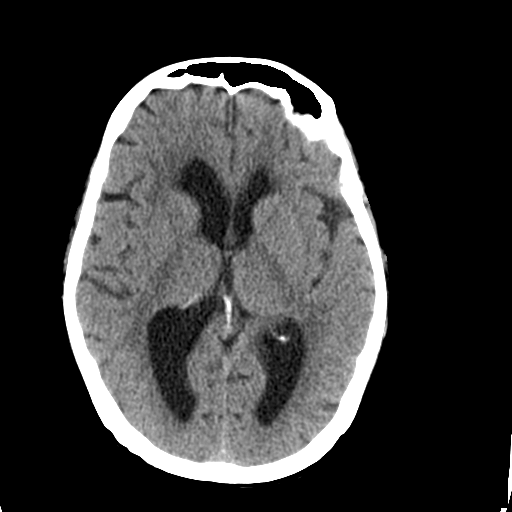
[im 21/33  brain]
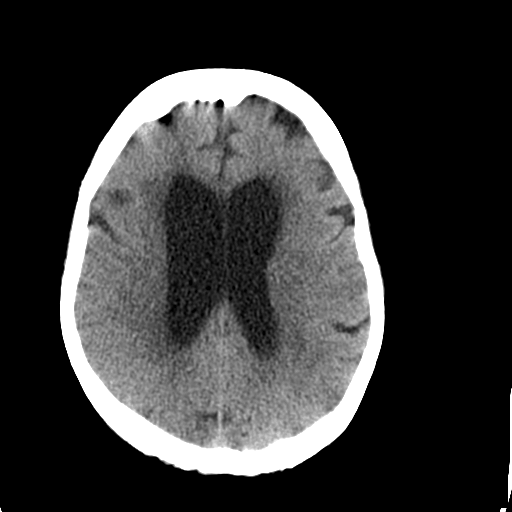
[im 21/33  bone]
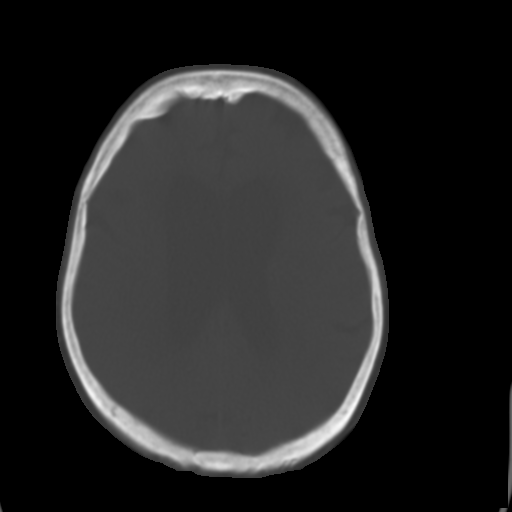
[im 25/33  brain]
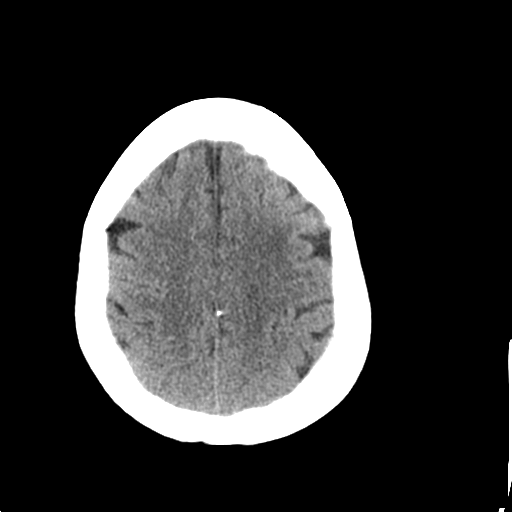
[im 29/33  brain]
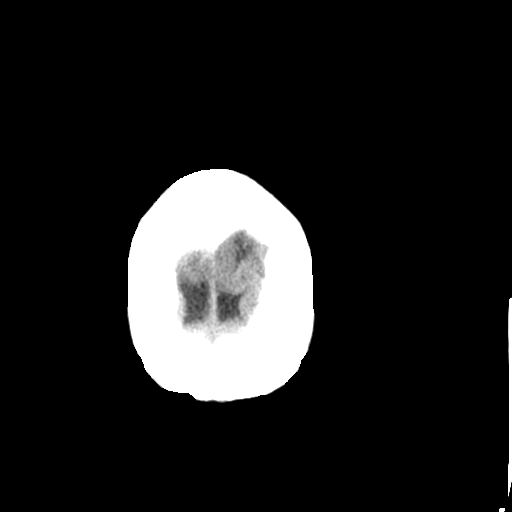

[Series 3: head bone · axial · 0.43mm/px · z∈[+1688,+1744]mm · 4 of 83 slices shown]
[im 9/83  bone]
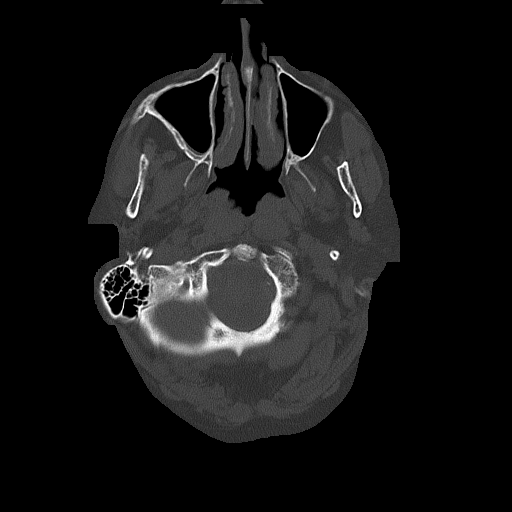
[im 17/83  bone]
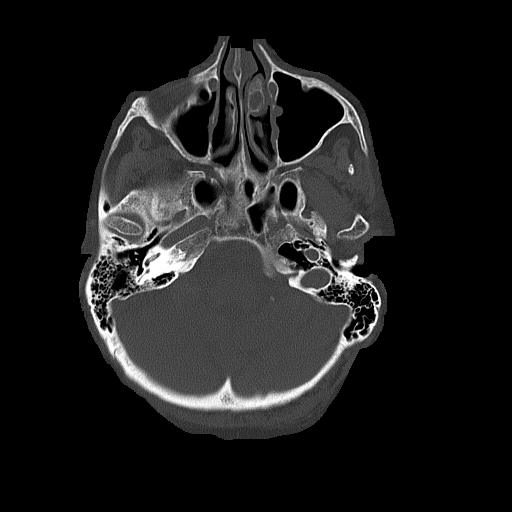
[im 25/83  bone]
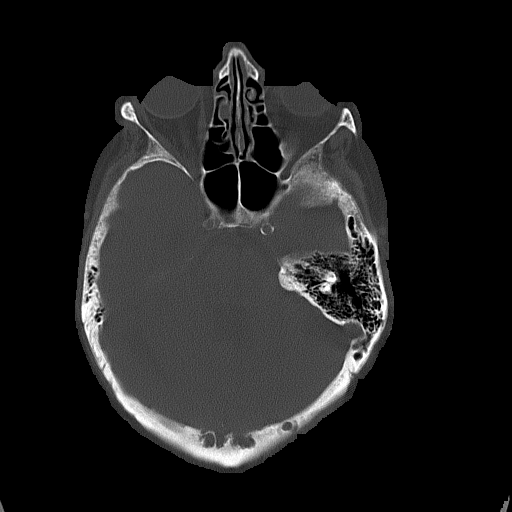
[im 37/83  bone]
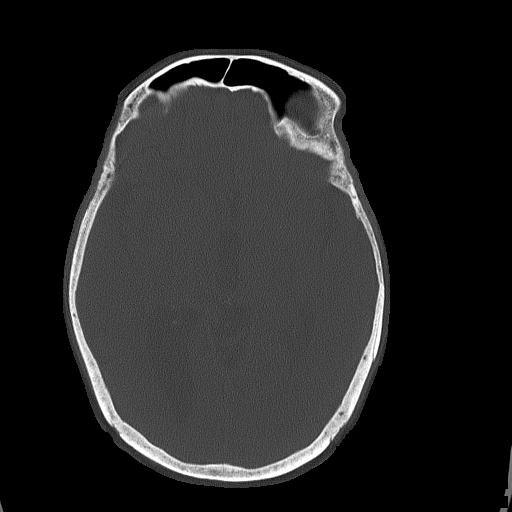

[Series 4: coronal soft tissue · coronal · 0.33mm/px · 3 of 69 slices shown]
[im 23/69  brain]
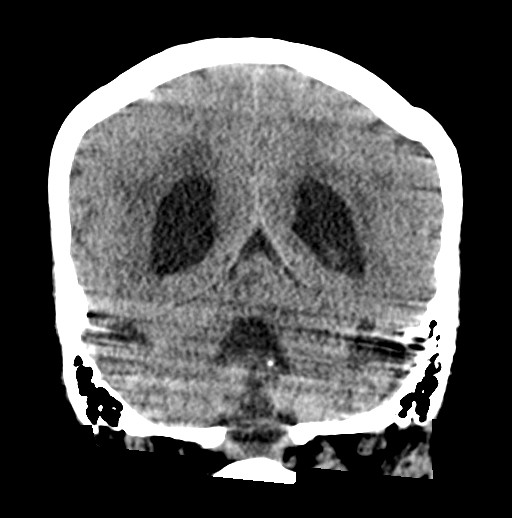
[im 31/69  brain]
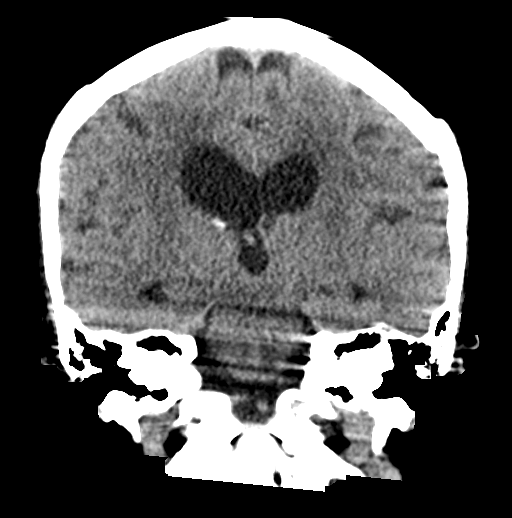
[im 38/69  brain]
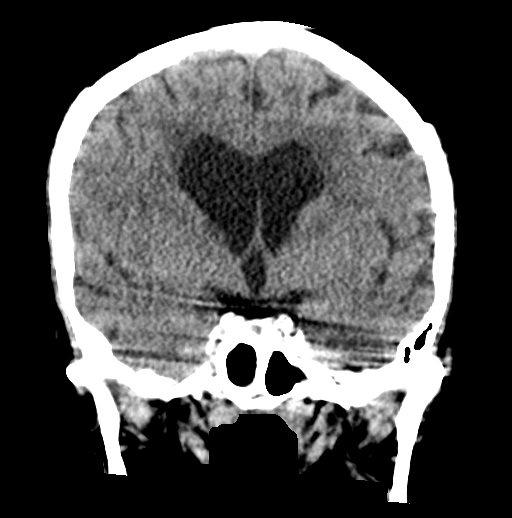

[Series 5: sagittal soft tissue · sagittal · 0.35mm/px · 3 of 54 slices shown]
[im 18/54  brain]
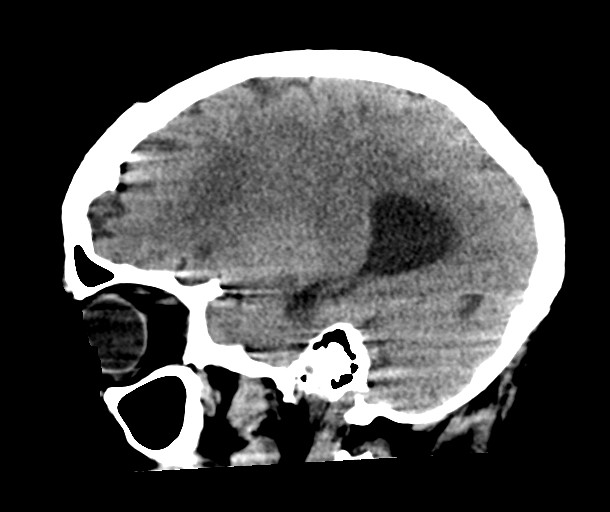
[im 27/54  brain]
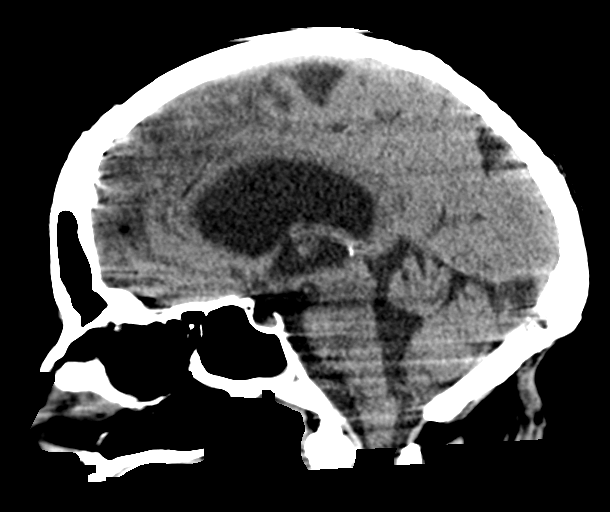
[im 36/54  brain]
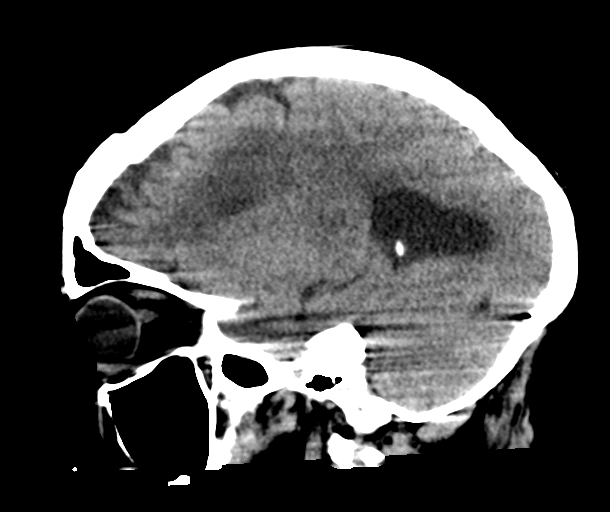

[17 of 47 positions shown; findings below may reference images not displayed]

FINDINGS: Brain: There is atrophy and chronic small vessel disease changes. No
acute intracranial abnormality. Specifically, no hemorrhage,
hydrocephalus, mass lesion, acute infarction, or significant
intracranial injury.

Vascular: No hyperdense vessel or unexpected calcification.

Skull: No acute calvarial abnormality.

Sinuses/Orbits: No acute findings

Other: None
IMPRESSION: Atrophy, chronic microvascular disease.

No acute intracranial abnormality.

## 2022-02-20 ENCOUNTER — Other Ambulatory Visit: Payer: Self-pay | Admitting: Pulmonary Disease

## 2022-02-26 ENCOUNTER — Encounter: Payer: Self-pay | Admitting: Pulmonary Disease

## 2022-02-26 ENCOUNTER — Ambulatory Visit: Payer: Medicare PPO | Admitting: Pulmonary Disease

## 2022-02-26 VITALS — BP 130/72 | HR 81 | Temp 97.9°F | Ht 59.5 in | Wt 175.4 lb

## 2022-02-26 DIAGNOSIS — J4489 Other specified chronic obstructive pulmonary disease: Secondary | ICD-10-CM

## 2022-02-26 DIAGNOSIS — J8489 Other specified interstitial pulmonary diseases: Secondary | ICD-10-CM | POA: Diagnosis not present

## 2022-02-26 DIAGNOSIS — I080 Rheumatic disorders of both mitral and aortic valves: Secondary | ICD-10-CM

## 2022-02-26 DIAGNOSIS — J9611 Chronic respiratory failure with hypoxia: Secondary | ICD-10-CM | POA: Diagnosis not present

## 2022-02-26 DIAGNOSIS — J3089 Other allergic rhinitis: Secondary | ICD-10-CM

## 2022-02-26 DIAGNOSIS — J302 Other seasonal allergic rhinitis: Secondary | ICD-10-CM

## 2022-02-26 MED ORDER — MOMETASONE FUROATE 50 MCG/ACT NA SUSP: NASAL | 3 refills | Status: DC

## 2022-02-26 MED ORDER — CETIRIZINE HCL 10 MG PO TABS: 10.0000 mg | ORAL_TABLET | Freq: Every day | ORAL | 2 refills | Status: DC

## 2022-02-26 NOTE — Progress Notes (Signed)
Subjective:    Patient ID: Kerri Carter, female    DOB: December 08, 1940, 82 y.o.   MRN: [id number] Patient Care Team: Carollee Leitz, MD as PCP - General (Family Medicine) Dannielle Karvonen, RN as [place] Management  Chief Complaint  Patient presents with   Follow-up    SOB with exertion, dry cough at times prod with greenish sputum and wheezing.     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  This is a scheduled visit.  We last evaluated her on 21 October 2021 at that time we adjusted her nebulizer medications.  She was switched to [place], Pulmicort and Yupelri.  However, due to cost the patient declined to get the [place].  She is currently taking DuoNeb and Pulmicort. She continues to have issues with dyspnea which are chronic for her no change in the pattern.  She cites compliance with DuoNeb 4 times a day and Pulmicort twice a day.  She was reminded to use her nebulizer medications at the very least 3 times a day.  She has not followed up with cardiology since November.  She goes to [facility] for her cardiology appointments.  A 2D echo performed 05 December 2021 at [facility] showed that her mitral regurgitation was worsening.    Since her last visit she has not had any fevers, chills or sweats.  Cough has been nonproductive with rare production of greenish sputum on occasion, this is baseline.  No hemoptysis.  He notes occasional wheezing.  No chest pain, orthopnea or paroxysmal nocturnal dyspnea.  She is compliant with oxygen at 3 L/min 24/7.  She became oxygen dependent completely after her April admission at [facility].  She has had significant nasal congestion and clear rhinorrhea over the last 2 to 3 days.  She does not endorse any other symptomatology today.  Review of Systems A 10 point review of systems was performed and it is as noted above otherwise  negative.  Patient Active Problem List   Diagnosis Date Noted   Iron deficiency anemia due to chronic blood loss 10/09/2021   Chronic heart failure with preserved ejection fraction ([place]) 09/24/2021   Acute on chronic diastolic CHF (congestive heart failure) ([place]) 08/20/2021   Paroxysmal atrial fibrillation ([place]) 07/29/2021   Impaired mobility 12/18/2020   Urinary incontinence 12/18/2020   Lumbar radiculopathy 12/11/2020   Hypertriglyceridemia 07/24/2019   Bipolar disorder, in full remission, most recent episode depressed ([place]) 07/13/2019   Essential hypertension 06/16/2019   Chronic pain 06/16/2019   Lymphedema 06/05/2019   Chronic venous insufficiency 05/25/2019   PAD (peripheral artery disease) ([place]) 05/25/2019   Interstitial pulmonary disease ([place]) 07/07/2018   Allergic rhinitis 09/28/2017   Age-related osteoporosis without current pathological fracture 11/05/2016   Peripheral neuropathy 10/28/2016   Osteoporosis 08/27/2016   Drug-induced osteoporosis 08/27/2016   Adnexal mass 08/25/2016   Prediabetes 08/25/2016   Coronary atherosclerosis 08/25/2016   Stage 3b chronic kidney disease ([place])    Hyperlipidemia 07/17/2015   Macular degeneration 07/17/2015   Rheumatoid arthritis ([place]) 12/05/2014   Osteoarthritis of knee 06/07/2013   Valvular heart disease 10/13/2011   Chronic obstructive pulmonary disease (COPD) ([place]) 09/09/2011   OSA (obstructive sleep apnea) 09/09/2011   Gastroesophageal reflux disease 02/24/2011   Single kidney 02/24/2011   Renal artery stenosis ([place]) 02/24/2011   Social History   Tobacco Use   Smoking status: Former  Packs/day: 0.50    Years: 20.00    Total pack years: 10.00    Types: Cigarettes    Quit date: 02/02/1974    Years since quitting: 48.0   Smokeless tobacco: Never  Substance Use Topics   Alcohol use: No   Allergies  Allergen Reactions   Ceftin [Cefuroxime Axetil] Anaphylaxis   Lisinopril Anaphylaxis   Sulfa Antibiotics Other (See  Comments)    Face swelling   Sulfasalazine Anaphylaxis   Morphine Other (See Comments)    Per patient, low blood pressure issues that requires action to raise it back up. Can take small infrequent doses   Xarelto [Rivaroxaban] Other (See Comments)    Stomach burning, bleeding, and tar in stool   Adhesive [Tape] Rash    Paper tape and tega derm OK   Antihistamines, Chlorpheniramine-Type Other (See Comments)    Makes pt hyper   Antivert [Meclizine Hcl] Other (See Comments)    Bladder will not empty   Aspirin Other (See Comments)    Sulfasalazine allergy cross reacts   Contrast Media [Iodinated Contrast Media] Rash    she is able to use betadine scrubs.   Decongestant [Pseudoephedrine Hcl] Other (See Comments)    Makes pt hyper   Doxycycline Other (See Comments)    GI upset   Levaquin [Levofloxacin In D5w] Rash   Polymyxin B Other (See Comments)    Facial rash   Tetanus Toxoids Rash and Other (See Comments)    Fever and hot to touch at injection site   Current Meds  Medication Sig   albuterol (PROVENTIL) (2.5 MG/3ML) 0.083% nebulizer solution Take by nebulization.   albuterol (VENTOLIN HFA) 108 (90 Base) MCG/ACT inhaler Inhale 2 puffs into the lungs every 6 (six) hours as needed for wheezing or shortness of breath.   amLODipine (NORVASC) 5 MG tablet Take 1 tablet (5 mg total) by mouth daily.   apixaban (ELIQUIS) 5 MG TABS tablet Take 1 tablet (5 mg total) by mouth 2 (two) times daily.   budesonide (PULMICORT) 0.5 MG/2ML nebulizer solution Take 2 mLs (0.5 mg total) by nebulization 2 (two) times daily.   busPIRone (BUSPAR) 5 MG tablet Take 1 tablet (5 mg total) by mouth 2 (two) times daily.   Cholecalciferol (VITAMIN D3) 50 MCG (2000 UT) TABS Take 4,000 Units by mouth daily.   dicyclomine (BENTYL) 10 MG capsule TAKE 1 CAPSULE (10 MG TOTAL) BY MOUTH 2 (TWO) TIMES DAILY AS NEEDED FOR SPASMS.   escitalopram (LEXAPRO) 10 MG tablet Take 1 tablet (10 mg total) by mouth daily.   famotidine  (PEPCID) 20 MG tablet TAKE 1 TABLET (20 MG TOTAL) BY MOUTH DAILY. BEFORE BREAKFAST OR DINNER   ferrous sulfate 325 (65 FE) MG EC tablet Take 1 tablet (325 mg total) by mouth 2 (two) times daily. (Patient taking differently: Take 325 mg by mouth 2 (two) times daily. Pt reports MD instructed her to take twice a day every other day.)   fluticasone (FLONASE) 50 MCG/ACT nasal spray Place 2 sprays into both nostrils daily. In am   gabapentin (NEURONTIN) 300 MG capsule Take 300 mg in the morning and 600 mg at night.   lamoTRIgine (LAMICTAL) 100 MG tablet TAKE 1 TAB 2 TIMES DAILY. FURTHER REFILLS NEW PSYCH FOR ALL PSYCH MEDS ONLY TEMP SUPPLY FROM PCP   leflunomide (ARAVA) 20 MG tablet Take 1 tablet (20 mg total) by mouth daily.   lidocaine (LIDODERM) 5 % Place 1 patch onto the skin 2 (two) times daily as  needed. Remove & Discard patch within 12 hours or as directed by MD (Patient taking differently: Place 1 patch onto the skin daily. Remove & Discard patch within 12 hours or as directed by MD)   lovastatin (MEVACOR) 20 MG tablet TAKE 1 TABLET BY MOUTH EVERYDAY AT BEDTIME *STOP TALKING '40MG'$ * (Patient taking differently: Take 20 mg by mouth at bedtime.)   methocarbamol (ROBAXIN) 500 MG tablet Take 500 mg by mouth 4 (four) times daily as needed. Taking twice a day currently   montelukast (SINGULAIR) 10 MG tablet TAKE 1 TABLET BY MOUTH EVERY DAY (Patient taking differently: Take 10 mg by mouth daily.)   multivitamin-lutein (OCUVITE-LUTEIN) CAPS capsule Take 1 capsule by mouth at bedtime.   nebivolol (BYSTOLIC) 5 MG tablet Take 1 tablet (5 mg total) by mouth in the morning and at bedtime. (Note dose changed from 1/2 10 mg bid to 5 mg bid)   OXYGEN Inhale 3 L into the lungs at bedtime.   pantoprazole (PROTONIX) 40 MG tablet TAKE 1 TABLET BY MOUTH 2 TIMES DAILY 30 MIN BEFORE FOOD (NOTE REDUCTION IN FREQUENCY)   predniSONE (DELTASONE) 5 MG tablet TAKE 1 TABLET BY MOUTH EVERY DAY WITH BREAKFAST   QUEtiapine (SEROQUEL)  25 MG tablet TAKE 1 TABLET (25 MG TOTAL) BY MOUTH AT BEDTIME. AGAIN LAST FILL FURTHER REFILLS FROM PSYCHIATRY NO EXCEPTIONS   sucralfate (CARAFATE) 1 g tablet TAKE 1 TABLET BY MOUTH 2 TIMES DAILY.   torsemide (DEMADEX) 20 MG tablet Take 1 tablet (20 mg total) by mouth daily.   traMADol (ULTRAM) 50 MG tablet Take by mouth. Take 0.5-1 tablets (25-50 mg total) by mouth every 12 (twelve) hours as needed   Vibegron (GEMTESA) 75 MG TABS Take 75 mg by mouth daily.   [DISCONTINUED] methylPREDNISolone (MEDROL DOSEPAK) 4 MG TBPK tablet Take as directed in the package.   Immunization History  Administered Date(s) Administered   Fluad Quad(high Dose 65+) 10/03/2018, 10/16/2019, 11/22/2020, 10/21/2021   Influenza Split 10/04/2012, 10/25/2013, 10/28/2016   Influenza Whole 11/03/2011   Influenza, High Dose Seasonal PF 10/04/2015, 11/03/2016   Influenza,inj,Quad PF,6+ Mos 10/31/2014   Influenza-Unspecified 01/02/2010, 11/24/2011, 10/21/2013, 10/31/2014, 09/28/2017   Pneumococcal Conjugate-13 10/21/2013   Pneumococcal Polysaccharide-23 11/07/2015   Respiratory Syncytial Virus Vaccine,Recomb Aduvanted(Arexvy) 01/12/2022       Objective:   Physical Exam BP 130/72 (BP Location: Left Arm, Cuff Size: Normal)   Pulse 81   Temp 97.9 F (36.6 C) (Temporal)   Ht 4' 11.5" (1.511 m)   Wt 175 lb 6.4 oz (79.6 kg)   SpO2 94%   BMI 34.83 kg/m   SpO2: 94 % O2 Device: Nasal cannula O2 Flow Rate (L/min): 3 L/min O2 Type: Continuous O2  GENERAL: Well-developed, well-nourished elderly woman, she presents on transport chair today.   HEAD: Normocephalic, atraumatic. EYES: Pupils equal, round, reactive to light.  No scleral icterus. MOUTH: Oral mucosa moist.  No thrush. NECK: Supple. No thyromegaly. Trachea midline. No JVD.  No adenopathy. PULMONARY: Coarse breath sounds throughout, faint end expiratory wheezes noted.  CARDIOVASCULAR: S1 and S2. Regular rate and rhythm.  Grade 3/6 systolic ejection murmur left  sternal border, seems more prominent than on prior exam. GASTROINTESTINAL: Benign MUSCULOSKELETAL: Mild changes of RA both hands, no clubbing, no edema.  Limited range of motion of joints due to RA. NEUROLOGIC: No overt focal deficits.  Awake, alert, speech is fluent. SKIN: Intact,warm,dry. PSYCH: Mood and behavior appropriate  Lab Results  Component Value Date   NITRICOXIDE 33 01/01/2022  *Indeterminate  of type II inflammation present.     Assessment & Plan:     ICD-10-CM   1. Asthma-COPD overlap syndrome  J44.89    Continue DuoNeb 4 times a day Continue Pulmicort twice a day Continue as needed albuterol Fairly well compensated    2. Chronic respiratory failure with hypoxia (HCC)  J96.11    Oxygen at 3 L/min via nasal cannula Patient compliant with her therapy    3. NSIP (nonspecific interstitial pneumonitis) (HCC)  J84.89    This issue adds complexity to her management Associated with her rheumatoid arthritis No overt worsening    4. Mitral and aortic valve regurgitation  I08.0 ECHOCARDIOGRAM COMPLETE   Murmur appears more pronounced Worsening mitral regurg on 11/23 echo Repeat echo query worsening mitral regurg    5. Seasonal and perennial allergic rhinitis  J30.89    J30.2    Discontinue Flonase Nasonex 1 spray twice daily to each nostril Zyrtec 10 mg at bedtime     Orders Placed This Encounter  Procedures   ECHOCARDIOGRAM COMPLETE    Standing Status:   Future    Standing Expiration Date:   02/27/2023    Order Specific Question:   Where should this test be performed    Answer:   [facility]    Order Specific Question:   Please indicate who you request to read the nuc med / echo results.    Answer:   [facility]    Order Specific Question:   Perflutren DEFINITY (image enhancing agent) should be administered unless hypersensitivity or allergy exist    Answer:   Administer Perflutren    Order Specific Question:   Is a special reader required? (athlete  or structural heart)    Answer:   No    Order Specific Question:   Does this study need to be read by the Structural team/Level 3 readers?    Answer:   No    Order Specific Question:   Reason for exam-Echo    Answer:   Mitral Valve Disorder  I05.9   Meds ordered this encounter  Medications   mometasone (NASONEX) 50 MCG/ACT nasal spray    Sig: 1 spray to each nostril twice a day    Dispense:  1 each    Refill:  3   cetirizine (ZYRTEC ALLERGY) 10 MG tablet    Sig: Take 1 tablet (10 mg total) by mouth at bedtime.    Dispense:  30 tablet    Refill:  2   The patient will see [place] in follow-up in 3 months time she is to call sooner should any new problems arise.  Renold Don, MD Advanced Bronchoscopy PCCM [facility]    *This note was dictated using voice recognition software/Dragon.  Despite best efforts to proofread, errors can occur which can change the meaning. Any transcriptional errors that result from this process are unintentional and may not be fully corrected at the time of dictation.

## 2022-02-26 NOTE — Patient Instructions (Signed)
Your heart murmur sounds a little louder than previously.  We are ordering another echocardiogram.  I sent prescriptions to your pharmacy for Nasonex 1 spray to each nostril twice a day, this will replace the Flonase.  We also sent a prescription of the Zyrtec so they can pull it off for you.  This Zyrtec is 1 tablet at bedtime.  The Zyrtec can make people a little sleepy so if you feel like you are "groggy" in the morning you may cut the tablet in half and just take a half a tablet.  We will see you in follow-up in 3 months time call sooner should any new problems arise.

## 2022-03-02 ENCOUNTER — Telehealth: Payer: Self-pay | Admitting: Pulmonary Disease

## 2022-03-02 DIAGNOSIS — I080 Rheumatic disorders of both mitral and aortic valves: Secondary | ICD-10-CM

## 2022-03-02 NOTE — Telephone Encounter (Signed)
We can do a limited echo looking at her mitral regurgitation.  By my examination it appears that her murmur has gotten louder.

## 2022-03-02 NOTE — Telephone Encounter (Signed)
I have placed the order for a Limited Echo and notified the patient.

## 2022-03-02 NOTE — Telephone Encounter (Signed)
Pls call PT about a Echo Cardio ordered for her byt Dr. Darnell Level. She had one done on November 3rd. Does she still want a new one ordered?  She is happy to have one or if the Dr. Geraldine Solar but maybe she can use the Nov one. Pls call @ the number on file.

## 2022-03-02 NOTE — Telephone Encounter (Signed)
The last echo that I saw on the CV procedures was in June.  If she had 1 in November that is not readily available for me to see.  Usually those are accessed by the "CV procedures" tab and it is not there.

## 2022-03-02 NOTE — Telephone Encounter (Signed)
Her last Echo was done by her Cardiologist at [facility].

## 2022-03-04 ENCOUNTER — Telehealth: Payer: Self-pay | Admitting: Pulmonary Disease

## 2022-03-04 NOTE — Telephone Encounter (Signed)
Dr. Patsey Berthold order for Echo complete was placed and you wanted the order to be changed to Echo Limited.  When I called Pamala Hurry with scheduling she thought they didn't do limited Echos.  Pamala Hurry spoke with Sonia Side that does the echos and he states that due to age the need to do Echo complete

## 2022-03-04 NOTE — Telephone Encounter (Signed)
Which is what I ordered on the first place.  Please lets do an echo complete I just want to make sure her mitral valve is not failing.

## 2022-03-04 NOTE — Telephone Encounter (Signed)
I have notified the patient.  Nothing further needed.

## 2022-03-06 ENCOUNTER — Encounter: Payer: Self-pay | Admitting: Pulmonary Disease

## 2022-03-09 ENCOUNTER — Inpatient Hospital Stay: Payer: Medicare PPO

## 2022-03-10 ENCOUNTER — Inpatient Hospital Stay: Payer: Medicare PPO | Attending: Oncology

## 2022-03-10 DIAGNOSIS — Z79899 Other long term (current) drug therapy: Secondary | ICD-10-CM | POA: Diagnosis not present

## 2022-03-10 DIAGNOSIS — N1832 Chronic kidney disease, stage 3b: Secondary | ICD-10-CM | POA: Diagnosis not present

## 2022-03-10 DIAGNOSIS — D500 Iron deficiency anemia secondary to blood loss (chronic): Secondary | ICD-10-CM

## 2022-03-10 DIAGNOSIS — D72828 Other elevated white blood cell count: Secondary | ICD-10-CM

## 2022-03-10 DIAGNOSIS — D509 Iron deficiency anemia, unspecified: Secondary | ICD-10-CM | POA: Diagnosis present

## 2022-03-10 DIAGNOSIS — D72829 Elevated white blood cell count, unspecified: Secondary | ICD-10-CM | POA: Insufficient documentation

## 2022-03-10 DIAGNOSIS — Z87891 Personal history of nicotine dependence: Secondary | ICD-10-CM | POA: Diagnosis not present

## 2022-03-10 LAB — IRON AND TIBC
Iron: 73 ug/dL (ref 28–170)
Saturation Ratios: 20 % (ref 10.4–31.8)
TIBC: 368 ug/dL (ref 250–450)
UIBC: 295 ug/dL

## 2022-03-10 LAB — CBC WITH DIFFERENTIAL/PLATELET
Abs Immature Granulocytes: 0.26 10*3/uL — ABNORMAL HIGH (ref 0.00–0.07)
Basophils Absolute: 0.1 10*3/uL (ref 0.0–0.1)
Basophils Relative: 1 %
Eosinophils Absolute: 0.8 10*3/uL — ABNORMAL HIGH (ref 0.0–0.5)
Eosinophils Relative: 5 %
HCT: 38.1 % (ref 36.0–46.0)
Hemoglobin: 12 g/dL (ref 12.0–15.0)
Immature Granulocytes: 2 %
Lymphocytes Relative: 12 %
Lymphs Abs: 2.1 10*3/uL (ref 0.7–4.0)
MCH: 28.5 pg (ref 26.0–34.0)
MCHC: 31.5 g/dL (ref 30.0–36.0)
MCV: 90.5 fL (ref 80.0–100.0)
Monocytes Absolute: 1.1 10*3/uL — ABNORMAL HIGH (ref 0.1–1.0)
Monocytes Relative: 7 %
Neutro Abs: 12.4 10*3/uL — ABNORMAL HIGH (ref 1.7–7.7)
Neutrophils Relative %: 73 %
Platelets: 273 10*3/uL (ref 150–400)
RBC: 4.21 MIL/uL (ref 3.87–5.11)
RDW: 15.5 % (ref 11.5–15.5)
WBC: 16.8 10*3/uL — ABNORMAL HIGH (ref 4.0–10.5)
nRBC: 0 % (ref 0.0–0.2)

## 2022-03-10 LAB — TECHNOLOGIST SMEAR REVIEW: Plt Morphology: NORMAL

## 2022-03-10 LAB — FERRITIN: Ferritin: 73 ng/mL (ref 11–307)

## 2022-03-11 ENCOUNTER — Inpatient Hospital Stay: Payer: Medicare PPO | Admitting: Oncology

## 2022-03-11 ENCOUNTER — Inpatient Hospital Stay: Payer: Medicare PPO

## 2022-03-11 MED FILL — Iron Sucrose Inj 20 MG/ML (Fe Equiv): INTRAVENOUS | Qty: 10 | Status: AC

## 2022-03-12 ENCOUNTER — Inpatient Hospital Stay: Payer: Medicare PPO | Admitting: Oncology

## 2022-03-12 ENCOUNTER — Encounter: Payer: Self-pay | Admitting: Oncology

## 2022-03-12 ENCOUNTER — Inpatient Hospital Stay: Payer: Medicare PPO

## 2022-03-12 VITALS — BP 139/65 | HR 96 | Temp 98.1°F | Resp 18 | Wt 175.0 lb

## 2022-03-12 DIAGNOSIS — D500 Iron deficiency anemia secondary to blood loss (chronic): Secondary | ICD-10-CM | POA: Diagnosis not present

## 2022-03-12 DIAGNOSIS — D72829 Elevated white blood cell count, unspecified: Secondary | ICD-10-CM | POA: Diagnosis not present

## 2022-03-12 DIAGNOSIS — N1832 Chronic kidney disease, stage 3b: Secondary | ICD-10-CM

## 2022-03-12 NOTE — Assessment & Plan Note (Signed)
Labs reviewed and discussed with patient. Iron panel is stable. Recommend patient to continue oral ferrous sulfate 325 mg twice daily.

## 2022-03-12 NOTE — Progress Notes (Signed)
Patient here for follow up. NO new concerns.

## 2022-03-12 NOTE — Assessment & Plan Note (Signed)
Encourage oral hydration and avoid nephrotoxins.   

## 2022-03-12 NOTE — Progress Notes (Signed)
Hematology/Oncology Consult note [facility] [phone] [phone]   CHIEF COMPLAINTS/REASON FOR VISIT:  Follow-up for chronic leukocytosis, iron deficiency anemia,  ASSESSMENT & PLAN:   Iron deficiency anemia due to chronic blood loss Labs reviewed and discussed with patient. Iron panel is stable. Recommend patient to continue oral ferrous sulfate 325 mg twice daily.  Stage 3b chronic kidney disease ([place]) Encourage oral hydration and avoid nephrotoxins.    Leukocytosis Patient has had previous work-up done.  Chronic leukocytosis was felt to be secondary to steroid use, autoimmune disease.  Orders Placed This Encounter  Procedures   CBC with Differential/Platelet    Standing Status:   Future    Standing Expiration Date:   03/13/2023   Ferritin    Standing Status:   Future    Standing Expiration Date:   03/13/2023   Iron and TIBC    Standing Status:   Future    Standing Expiration Date:   03/13/2023   Follow-up in 4 months. All questions were answered. The patient knows to call the clinic with any problems, questions or concerns.  Earlie Server, MD, PhD [facility] Hematology Oncology 03/12/2022     HISTORY OF PRESENTING ILLNESS:  Kerri Carter is a  82 y.o.  female presents for follow-up of leukocytosis, iron deficiency anemia. Patient was last seen by Beckey Rutter 2 months ago.  Recent admission in August 2023 due to COPD exacerbation.  Patient was found to have severe anemia during her admission received 1 unit of PRBC transfusion.  Patient follows up with gastroenterology Dr. Vicente Males who feels that any endoscopy procedure is of high risk due to patient's respiratory status. Patient takes oral iron supplementation.  She is also on Eliquis for anticoagulation. Today patient reports feeling well.  No new complaints.  Denies any blood in the stool  INTERVAL HISTORY Kerri Carter is a 82 y.o. female who has above history reviewed by me  today presents for follow up visit for anemia.  She has no new complaints. Take oral iron supplementation.    Review of Systems  Constitutional:  Positive for fatigue. Negative for appetite change, chills, fever and unexpected weight change.  HENT:   Negative for hearing loss and voice change.   Eyes:  Negative for eye problems.  Respiratory:  Negative for chest tightness and cough.   Cardiovascular:  Positive for leg swelling. Negative for chest pain.  Gastrointestinal:  Negative for abdominal distention, abdominal pain and blood in stool.  Endocrine: Negative for hot flashes.  Genitourinary:  Negative for difficulty urinating and frequency.   Musculoskeletal:  Positive for arthralgias.  Skin:  Negative for itching and rash.  Neurological:  Negative for extremity weakness.  Hematological:  Negative for adenopathy.  Psychiatric/Behavioral:  Negative for confusion.      MEDICAL HISTORY:  Past Medical History:  Diagnosis Date   Acromial process of scapula fracture 12/21/2019   Acute kidney injury superimposed on chronic kidney disease ([place]) 05/26/2021   Acute on chronic combined systolic (congestive) and diastolic (congestive) heart failure ([place]) 07/28/2021   Acute on chronic combined systolic and diastolic CHF (congestive heart failure) ([place]) 05/26/2021   Acute on chronic respiratory failure with hypoxia ([place]) 09/10/2021   Acute pain of left knee 12/18/2020   Anemia in stage 3a chronic kidney disease ([place]) 10/09/2021   Anxiety    Anxiety 01/20/2019   Anxiety and depression 05/26/2021   Aortic stenosis 07/09/2015   a.) TTE 07/06/2015: EF 55-60%; mild AS with  MPG 13.0 mmHg.   Arrhythmia    atrial fibrillation   Assistance needed for mobility 12/18/2020   Asthma    At high risk for falls 03/16/2019   Avascular necrosis of left shoulder due to adverse effect of steroid therapy ([place]) 01/20/2018   Bipolar disorder ([place])    C. difficile diarrhea 2010   a.) following ABX course  during hospital admission   Carotid atherosclerosis, bilateral    a.) Moderate; < 50% stenosis BILATERAL ICAs.   Cataract    a.) s/p BILATERAL extraction   CHF (congestive heart failure) (HCC)    Chicken pox    Chronic left shoulder pain 08/29/2019   Chronic respiratory failure with hypoxia ([place]) 10/01/2021   CKD (chronic kidney disease), stage III (HCC)    a. s/p R nephrectomy./ aneurysm   Closed fracture of acromial process of scapula 10/28/2018   Closed nondisplaced fracture of proximal phalanx of lesser toe of right foot    Collapsed vertebra, not elsewhere classified, lumbar region, initial encounter for fracture ([place]) 02/21/2019   Community acquired pneumonia 37/11/6267   Complicated grief 48/54/6270   Compression fracture of L4 vertebra ([place]) 02/21/2019   Conversion disorder    Conversion disorder 08/04/2015   COPD (chronic obstructive pulmonary disease) ([place])    COPD with exacerbation ([place]) 08/20/2021   Cough 05/02/2012   Depression    Dislocation of hip joint prosthesis ([place]) 01/08/2017   Elevated brain natriuretic peptide (BNP) level 12/18/2020   Elevated d-dimer 05/26/2021   Episodic mood disorder ([place]) 04/18/2019   Essential hypertension    Fatigue 07/07/2018   Frequent falls 12/18/2020   GERD (gastroesophageal reflux disease)    H/O cardiac catheterization 02/24/2011   Formatting of this note might be different from the original.  Repeated at [facility] 2/13 and insignificant disease   Hav (hallux abducto valgus), unspecified laterality 12/05/2018   Heart murmur    Hip fracture ([place]) 10/31/2016   Hip pain 12/18/2020   History of fracture of left hip 01/15/2017   History of syncope 08/25/2016   Hyperkalemia 05/26/2021   - The patient was given p.o. Lokelma and IV Lasix.  - We will follow potassium level.   Hyperlipidemia    Hypokalemia 08/25/2016   ILD (interstitial lung disease) (HCC)    mild; 2/2 RA diagnosis   Iliopsoas bursitis of right hip 08/29/2019    Inflammatory arthritis    a. hands/carpal tunnel.  b. Low titer rheumatoid factor. c. Negative anti-CCP antibodies. d. Plaquenil.   Insomnia 07/17/2015   Left foot pain 10/19/2016   Left lower lobe pneumonia 05/26/2021   Leg edema 05/02/2019   Leg pain, bilateral    Leg swelling 12/15/2016   Leukocytosis 10/19/2017   Leukopenia 06/24/2021   Lymphocytosis 07/13/2021   Macular degeneration    Microcytic anemia 08/15/2015   Mixed bipolar I disorder ([place]) 10/09/2015   Nocturnal hypoxemia    Nocturnal hypoxemia 09/09/2011   June 2013 overnight oximetry on 6 centimeters of water CPAP: SpO2 less than 88% 72% of the time   Non-Obstructive CAD    a. 07/2009 Cath ([facility]): nonobs dzs;  b. 03/2011 Cath [facility]): nonobs dzs.   NSIP (nonspecific interstitial pneumonitis) ([place]) 12/08/2019   Osteoarthritis    a. Knees.   Other shoulder lesions, left shoulder 12/20/2017   Other specified postprocedural states 07/20/2013   Overweight (BMI 25.0-29.9) 08/29/2019   PAD (peripheral artery disease) (HCC)    Pain due to onychomycosis of toenails of both feet 12/05/2018   Pelvic  mass in female 03/16/2019   02/21/19 CT pelvis w/o contrast  Cystic left adnexal lesion has enlarged since 2018 where it  measured approximately 1.8 x 1.7 cm. This is not well assessed due  to streak artifact as well as lack of contrast. Ultrasound follow-up  may be helpful in 8-12 weeks      04/05/19 TVUS     IMPRESSION:  Simple appearing cystic structure within the left ovary measuring up  to 2.9 cm. While this has enlarged s   Physical deconditioning 02/27/2021   Pleural effusion on left 12/18/2020   PUD (peptic ulcer disease)    Recurrent falls 09/01/2021   Recurrent pneumonia 07/23/2021   Repeated falls 12/18/2020   Respiratory failure with hypoxia ([place]) 01/12/2021   Rotator cuff tendinitis, right 07/26/2017   S/P lumbar fusion 05/23/2021   S/P right hip fracture    11/01/16 s/p repair   Sepsis due to pneumonia ([place]) 07/22/2021    Shoulder pain    Sleep apnea    no cpap / minimal   SOB (shortness of breath) 10/13/2011   2011 [facility] eval >> LHC (non-obstructive CAD), RHC (wedge 13, mean PA 26), TTE (LVEF > 55%, mild concentric LVH, mild AR, MR, PR, TR), CTAngio chest unrevealing; "well controlled asthma"  02/2011 TTE [facility]>> NL LVEF, Mild LVH, Mod AR/Mod MR   Spinal stenosis at L4-L5 level    severe with L4/L5 anterolisthesis grade 1 anterolisthesis    Spinal stenosis of lumbar region 09/15/2016   Last Assessment & Plan:   Now post op with continued back and leg pain noted. Is working with pt/ot and surgery was said to have no complications. Bowels are moving and general pain and anxiety is basically baseline.    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   Weakness of both lower extremities 12/18/2020    SURGICAL HISTORY: Past Surgical History:  Procedure Laterality Date   APPENDECTOMY     APPLICATION OF INTRAOPERATIVE CT SCAN N/A 05/23/2021   Procedure: APPLICATION OF INTRAOPERATIVE CT SCAN;  Surgeon: Meade Maw, MD;  Location: [facility] ORS;  Service: Neurosurgery;  Laterality: N/A;   BACK SURGERY     lumbar fusion   BUNIONECTOMY Right    CATARACT EXTRACTION, BILATERAL     CESAREAN SECTION     x1   CHOLECYSTECTOMY N/A 05/11/2016   Procedure: LAPAROSCOPIC CHOLECYSTECTOMY;  Surgeon: Florene Glen, MD;  Location: [facility] ORS;  Service: General;  Laterality: N/A;   COLONOSCOPY  WITH PROPOFOL N/A 04/02/2016   Procedure: COLONOSCOPY WITH PROPOFOL;  Surgeon: Jonathon Bellows, MD;  Location: [facility] ENDOSCOPY;  Service: Endoscopy;  Laterality: N/A;   ENDOSCOPIC RETROGRADE CHOLANGIOPANCREATOGRAPHY (ERCP) WITH PROPOFOL N/A 05/08/2016   Procedure: ENDOSCOPIC RETROGRADE CHOLANGIOPANCREATOGRAPHY (ERCP) WITH PROPOFOL;  Surgeon: Lucilla Lame, MD;  Location: [facility] ENDOSCOPY;  Service: Endoscopy;  Laterality: N/A;   ERCP     with biliary spincterotomy 05/08/16 Dr. Allen Norris for choledocholithiasis    ESOPHAGEAL DILATION  04/02/2016   Procedure:  ESOPHAGEAL DILATION;  Surgeon: Jonathon Bellows, MD;  Location: [facility] ENDOSCOPY;  Service: Endoscopy;;   ESOPHAGOGASTRODUODENOSCOPY (EGD) WITH PROPOFOL N/A 04/02/2016   Procedure: ESOPHAGOGASTRODUODENOSCOPY (EGD) WITH PROPOFOL;  Surgeon: Jonathon Bellows, MD;  Location: [facility] END[facility];  Service: Endoscopy;  Laterality: N/A;   HIP ARTHROPLASTY Right 11/01/2016   Procedure: ARTHROPLASTY BIPOLAR HIP (HEMIARTHROPLASTY);  Surgeon: Corky Mull, MD;  Location: [facility] OR[facility];  Service: Orthopedics;  Laterality: Right;   LUMBAR LAMINECTOMY/DECOMPRESSION MICRODISCECTOMY N/A 12/11/2020   Procedure: LEFT L2-3 MICRODISCECTOMY, L3-4 AND L5-S1 DECOMPRESSION;  Surgeon: Meade Maw, MD;  Location: [facility];  Service: Neurosurgery;  Laterality: N/A;   NEPHRECTOMY  1988   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   ORIF SCAPHOID FRACTURE Left 12/21/2019   Procedure: OPEN REDUCTION INTERNAL FIXATION (ORIF) OF LEFT SCAPULAR NONUNION WITH BONE GRAFT;  Surgeon: Corky Mull, MD;  Location: [facility] OR[facility];  Service: Orthopedics;  Laterality: Left;   osteoporosis     noted DEXA 08/19/16    REPLACEMENT TOTAL KNEE Right    REVERSE SHOULDER ARTHROPLASTY Right 11/04/2017   Procedure: REVERSE SHOULDER ARTHROPLASTY;  Surgeon: Corky Mull, MD;  Location: [facility];  Service: Orthopedics;  Laterality: Right;   REVERSE SHOULDER ARTHROPLASTY Left 07/26/2018   Procedure: REVERSE SHOULDER  ARTHROPLASTY;  Surgeon: Corky Mull, MD;  Location: [facility];  Service: Orthopedics;  Laterality: Left;   TONSILLECTOMY     TOTAL HIP ARTHROPLASTY  12/10/11   [facility] left hip   TOTAL HIP ARTHROPLASTY Bilateral    TRANSFORAMINAL LUMBAR INTERBODY FUSION (TLIF) WITH PEDICLE SCREW FIXATION 3 LEVEL N/A 05/23/2021   Procedure: OPEN L2-4 TRANSFORAMINAL LUMBAR INTERBODY FUSION (TLIF) WITH L2-5 POSTERIOR SPINAL FUSION;  Surgeon: Meade Maw, MD;  Location: [facility];  Service: Neurosurgery;  Laterality: N/A;   TUBAL LIGATION      SOCIAL HISTORY: Social History   Socioeconomic History   Marital status: Widowed    Spouse name: richard   Number of children: 2   Years of education: Some Coll   Highest education level: Some college, no degree  Occupational History   Occupation: Retired    Comment: retired  Tobacco Use   Smoking status: Former    Packs/day: 0.50    Years: 20.00    Total pack years: 10.00    Types: Cigarettes    Quit date: 02/02/1974    Years since quitting: 48.1   Smokeless tobacco: Never  Vaping Use   Vaping Use: Never used  Substance and Sexual Activity   Alcohol use: No   Drug use: No   Sexual activity: Not Currently  Other Topics Concern   Not on file  Social History Narrative   Lives at home in [place] grandson, his wife and their child.   Husband Lynelle Weiler died covid 88 late 1/2029mrried x 530years   Right-handed.   6 cups coffee per day.   Social Determinants of Health   Financial Resource Strain: Low Risk  (06/18/2021)   Overall Financial Resource Strain (CARDIA)    Difficulty of Paying Living Expenses: Not hard at all  Food Insecurity: No Food Insecurity (06/18/2021)   Hunger Vital Sign    Worried About Running Out of Food in the Last Year: Never true    Ran Out of Food in the Last Year: Never true  Transportation Needs: Unmet Transportation Needs (06/26/2021)   PRAPARE - THydrologist(Medical): Yes    Lack of  Transportation (Non-Medical): Yes  Physical  Activity: Inactive (11/24/2019)   Exercise Vital Sign    Days of Exercise per Week: 0 days    Minutes of Exercise per Session: 0 min  Stress: No Stress Concern Present (06/18/2021)   [place]    Feeling of Stress : Not at all  Social Connections: Moderately Integrated (06/18/2021)   Social Connection and Isolation Panel [NHANES]    Frequency of Communication with Friends and Family: More than three times a week    Frequency of Social Gatherings with Friends and Family: Never    Attends Religious Services: More than 4 times per year    Active Member of [employer] or Organizations: No    Attends Archivist Meetings: Never    Marital Status: Married  Human resources officer Violence: Not At Risk (06/18/2021)   Humiliation, Afraid, Rape, and Kick questionnaire    Fear of Current or Ex-Partner: No    Emotionally Abused: No    Physically Abused: No    Sexually Abused: No    FAMILY HISTORY: Family History  Problem Relation Age of Onset   Rheum arthritis Mother    Asthma Mother    Parkinson's disease Mother    Heart disease Mother    Stroke Mother    Hypertension Mother    Heart attack Father    Heart disease Father    Hypertension Father    Peripheral Artery Disease Father    Diabetes Son    Gout Son    Asthma Sister    Heart disease Sister    Lung cancer Sister    Heart disease Sister    Heart disease Sister    Breast cancer Sister    Heart attack Sister    Heart disease Brother    Heart disease Maternal Grandmother    Diabetes Maternal Grandmother    Colon cancer Maternal Grandmother    Cancer Maternal Grandmother        Hodgkins lymphoma   Heart disease Brother    Alcohol abuse Brother    Depression Brother    Dementia Son     ALLERGIES:  is allergic to ceftin [cefuroxime axetil]; lisinopril; sulfa antibiotics; sulfasalazine; morphine; xarelto [rivaroxaban];  adhesive [tape]; antihistamines, chlorpheniramine-type; antivert [meclizine hcl]; aspirin; contrast media [iodinated contrast media]; decongestant [pseudoephedrine hcl]; doxycycline; levaquin [levofloxacin in d5w]; polymyxin b; and tetanus toxoids.  MEDICATIONS:  Current Outpatient Medications  Medication Sig Dispense Refill   albuterol (PROVENTIL) (2.5 MG/3ML) 0.083% nebulizer solution Take by nebulization.     albuterol (VENTOLIN HFA) 108 (90 Base) MCG/ACT inhaler Inhale 2 puffs into the lungs every 6 (six) hours as needed for wheezing or shortness of breath. 18 g 11   amLODipine (NORVASC) 5 MG tablet Take 1 tablet (5 mg total) by mouth daily. 90 tablet 3   apixaban (ELIQUIS) 5 MG TABS tablet Take 1 tablet (5 mg total) by mouth 2 (two) times daily. 180 tablet 1   budesonide (PULMICORT) 0.5 MG/2ML nebulizer solution Take 2 mLs (0.5 mg total) by nebulization 2 (two) times daily. 120 mL 11   busPIRone (BUSPAR) 5 MG tablet Take 1 tablet (5 mg total) by mouth 2 (two) times daily.     cetirizine (ZYRTEC ALLERGY) 10 MG tablet Take 1 tablet (10 mg total) by mouth at bedtime. 30 tablet 2   Cholecalciferol (VITAMIN D3) 50 MCG (2000 UT) TABS Take 4,000 Units by mouth daily. 90 tablet 3   dicyclomine (BENTYL) 10 MG capsule TAKE 1 CAPSULE (10 MG  TOTAL) BY MOUTH 2 (TWO) TIMES DAILY AS NEEDED FOR SPASMS. 180 capsule 2   escitalopram (LEXAPRO) 10 MG tablet Take 1 tablet (10 mg total) by mouth daily. 90 tablet 3   famotidine (PEPCID) 20 MG tablet TAKE 1 TABLET (20 MG TOTAL) BY MOUTH DAILY. BEFORE BREAKFAST OR DINNER 90 tablet 1   ferrous sulfate 325 (65 FE) MG EC tablet Take 1 tablet (325 mg total) by mouth 2 (two) times daily. (Patient taking differently: Take 325 mg by mouth 2 (two) times daily. Pt reports MD instructed her to take twice a day every other day.) 60 tablet 3   gabapentin (NEURONTIN) 300 MG capsule Take 300 mg in the morning and 600 mg at night.     lamoTRIgine (LAMICTAL) 100 MG tablet TAKE 1 TAB  2 TIMES DAILY. FURTHER REFILLS NEW PSYCH FOR ALL PSYCH MEDS ONLY TEMP SUPPLY FROM PCP 180 tablet 1   leflunomide (ARAVA) 20 MG tablet Take 1 tablet (20 mg total) by mouth daily.     lidocaine (LIDODERM) 5 % Place 1 patch onto the skin 2 (two) times daily as needed. Remove & Discard patch within 12 hours or as directed by MD (Patient taking differently: Place 1 patch onto the skin daily. Remove & Discard patch within 12 hours or as directed by MD) 120 patch 2   lovastatin (MEVACOR) 20 MG tablet TAKE 1 TABLET BY MOUTH EVERYDAY AT BEDTIME *STOP TALKING '40MG'$ * (Patient taking differently: Take 20 mg by mouth at bedtime.) 90 tablet 2   methocarbamol (ROBAXIN) 500 MG tablet Take 500 mg by mouth 4 (four) times daily as needed. Taking twice a day currently     mometasone (NASONEX) 50 MCG/ACT nasal spray 1 spray to each nostril twice a day 1 each 3   montelukast (SINGULAIR) 10 MG tablet TAKE 1 TABLET BY MOUTH EVERY DAY (Patient taking differently: Take 10 mg by mouth daily.) 90 tablet 1   multivitamin-lutein (OCUVITE-LUTEIN) CAPS capsule Take 1 capsule by mouth at bedtime.     nebivolol (BYSTOLIC) 5 MG tablet Take 1 tablet (5 mg total) by mouth in the morning and at bedtime. (Note dose changed from 1/2 10 mg bid to 5 mg bid) 180 tablet 3   OXYGEN Inhale 3 L into the lungs at bedtime.     pantoprazole (PROTONIX) 40 MG tablet TAKE 1 TABLET BY MOUTH 2 TIMES DAILY 30 MIN BEFORE FOOD (NOTE REDUCTION IN FREQUENCY) 180 tablet 1   predniSONE (DELTASONE) 5 MG tablet TAKE 1 TABLET BY MOUTH EVERY DAY WITH BREAKFAST 30 tablet 3   QUEtiapine (SEROQUEL) 25 MG tablet TAKE 1 TABLET (25 MG TOTAL) BY MOUTH AT BEDTIME. AGAIN LAST FILL FURTHER REFILLS FROM PSYCHIATRY NO EXCEPTIONS 90 tablet 0   sucralfate (CARAFATE) 1 g tablet TAKE 1 TABLET BY MOUTH 2 TIMES DAILY. 180 tablet 2   torsemide (DEMADEX) 20 MG tablet Take 1 tablet (20 mg total) by mouth daily. 30 tablet 3   traMADol (ULTRAM) 50 MG tablet Take by mouth. Take 0.5-1  tablets (25-50 mg total) by mouth every 12 (twelve) hours as needed     Vibegron (GEMTESA) 75 MG TABS Take 75 mg by mouth daily. 90 tablet 3   No current facility-administered medications for this visit.     PHYSICAL EXAMINATION: ECOG PERFORMANCE STATUS: 2 - Symptomatic, <50% confined to bed Vitals:   03/12/22 1347  BP: 139/65  Pulse: 96  Resp: 18  Temp: 98.1 F (36.7 C)  SpO2: 100%   Filed Weights  03/12/22 1347  Weight: 175 lb (79.4 kg)    Physical Exam Constitutional:      General: She is not in acute distress.    Comments: Patient sits in the wheelchair  HENT:     Head: Normocephalic and atraumatic.  Eyes:     General: No scleral icterus. Cardiovascular:     Rate and Rhythm: Normal rate and regular rhythm.     Heart sounds: Murmur heard.  Pulmonary:     Effort: Pulmonary effort is normal. No respiratory distress.     Breath sounds: No wheezing.  Abdominal:     General: Bowel sounds are normal. There is no distension.     Palpations: Abdomen is soft.  Musculoskeletal:        General: No deformity. Normal range of motion.     Cervical back: Normal range of motion and neck supple.  Skin:    General: Skin is warm and dry.     Findings: No erythema or rash.  Neurological:     Mental Status: She is alert and oriented to person, place, and time. Mental status is at baseline.     Cranial Nerves: No cranial nerve deficit.     Coordination: Coordination normal.  Psychiatric:        Mood and Affect: Mood normal.         RADIOGRAPHIC STUDIES: I have personally reviewed the radiological images as listed and agreed with the findings in the report. No results found.  LABORATORY DATA:  I have reviewed the data as listed    Latest Ref Rng & Units 03/10/2022   11:43 AM 12/09/2021    1:37 PM 10/09/2021   11:19 AM  CBC  WBC 4.0 - 10.5 K/uL 16.8  15.4  12.0   Hemoglobin 12.0 - 15.0 g/dL 12.0  12.4  11.3   Hematocrit 36.0 - 46.0 % 38.1  39.7  36.8   Platelets 150 -  400 K/uL 273  248  220       Latest Ref Rng & Units 12/09/2021    1:37 PM 10/09/2021   11:19 AM 09/12/2021    6:45 AM  CMP  Glucose 70 - 99 mg/dL 186  122  127   BUN 8 - 23 mg/dL '27  25  29   '$ Creatinine 0.44 - 1.00 mg/dL 1.50  1.68  1.18   Sodium 135 - 145 mmol/L 139  140  141   Potassium 3.5 - 5.1 mmol/L 3.4  3.5  3.7   Chloride 98 - 111 mmol/L 96  100  104   CO2 22 - 32 mmol/L '29  29  29   '$ Calcium 8.9 - 10.3 mg/dL 9.1  8.7  7.7   Total Protein 6.5 - 8.1 g/dL 7.3  6.8    Total Bilirubin 0.3 - 1.2 mg/dL 0.5  0.4    Alkaline Phos 38 - 126 U/L 84  100    AST 15 - 41 U/L 32  24    ALT 0 - 44 U/L 18  14       Iron/TIBC/Ferritin/ %Sat    Component Value Date/Time   IRON 73 03/10/2022 1143   TIBC 368 03/10/2022 1143   FERRITIN 73 03/10/2022 1143   IRONPCTSAT 20 03/10/2022 1143   IRONPCTSAT 12 12/28/2016 1645

## 2022-03-12 NOTE — Assessment & Plan Note (Signed)
Patient has had previous work-up done.  Chronic leukocytosis was felt to be secondary to steroid use, autoimmune disease.

## 2022-03-19 ENCOUNTER — Ambulatory Visit
Payer: Medicare PPO | Attending: Student in an Organized Health Care Education/Training Program | Admitting: Student in an Organized Health Care Education/Training Program

## 2022-03-19 ENCOUNTER — Encounter: Payer: Self-pay | Admitting: Student in an Organized Health Care Education/Training Program

## 2022-03-19 VITALS — BP 108/62 | HR 90 | Temp 98.1°F | Ht 59.0 in | Wt 175.0 lb

## 2022-03-19 DIAGNOSIS — I739 Peripheral vascular disease, unspecified: Secondary | ICD-10-CM | POA: Diagnosis present

## 2022-03-19 DIAGNOSIS — Z9889 Other specified postprocedural states: Secondary | ICD-10-CM | POA: Diagnosis present

## 2022-03-19 DIAGNOSIS — M069 Rheumatoid arthritis, unspecified: Secondary | ICD-10-CM | POA: Insufficient documentation

## 2022-03-19 DIAGNOSIS — M170 Bilateral primary osteoarthritis of knee: Secondary | ICD-10-CM | POA: Insufficient documentation

## 2022-03-19 DIAGNOSIS — M25512 Pain in left shoulder: Secondary | ICD-10-CM | POA: Insufficient documentation

## 2022-03-19 DIAGNOSIS — T50905A Adverse effect of unspecified drugs, medicaments and biological substances, initial encounter: Secondary | ICD-10-CM | POA: Diagnosis present

## 2022-03-19 DIAGNOSIS — G4733 Obstructive sleep apnea (adult) (pediatric): Secondary | ICD-10-CM | POA: Insufficient documentation

## 2022-03-19 DIAGNOSIS — G894 Chronic pain syndrome: Secondary | ICD-10-CM | POA: Diagnosis not present

## 2022-03-19 DIAGNOSIS — Z981 Arthrodesis status: Secondary | ICD-10-CM | POA: Insufficient documentation

## 2022-03-19 DIAGNOSIS — M818 Other osteoporosis without current pathological fracture: Secondary | ICD-10-CM | POA: Diagnosis not present

## 2022-03-19 DIAGNOSIS — N1832 Chronic kidney disease, stage 3b: Secondary | ICD-10-CM | POA: Diagnosis present

## 2022-03-19 DIAGNOSIS — G8929 Other chronic pain: Secondary | ICD-10-CM | POA: Insufficient documentation

## 2022-03-19 DIAGNOSIS — M25511 Pain in right shoulder: Secondary | ICD-10-CM | POA: Diagnosis not present

## 2022-03-19 DIAGNOSIS — M5416 Radiculopathy, lumbar region: Secondary | ICD-10-CM | POA: Insufficient documentation

## 2022-03-19 DIAGNOSIS — J449 Chronic obstructive pulmonary disease, unspecified: Secondary | ICD-10-CM | POA: Diagnosis present

## 2022-03-19 NOTE — Progress Notes (Signed)
Safety precautions to be maintained throughout the outpatient stay will include: orient to surroundings, keep bed in low position, maintain call bell within reach at all times, provide assistance with transfer out of bed and ambulation.  

## 2022-03-19 NOTE — Progress Notes (Signed)
Patient: Kerri Carter  Service Category: E/M  Provider: Gillis Santa, MD  DOB: 1940-12-10  DOS: 03/19/2022  Referring Provider: Lattie Corns, [place]*  MRN: [id number]  Setting: Ambulatory outpatient  PCP: Carollee Leitz, MD  Type: New Patient  Specialty: Interventional Pain Management    Location: Office  Delivery: Face-to-face     Primary Reason(s) for Visit: Encounter for initial evaluation of one or more chronic problems (new to examiner) potentially causing chronic pain, and posing a threat to normal musculoskeletal function. (Level of risk: High) CC: Arm Pain (both)  HPI  Kerri Carter is a 82 y.o. year old, female patient, who comes for the first time to our practice referred by Lattie Corns, PA* for our initial evaluation of her chronic pain. She has Chronic obstructive pulmonary disease (COPD) ([place]); OSA (obstructive sleep apnea); Valvular heart disease; Rheumatoid arthritis ([place]); Hyperlipidemia; Macular degeneration; Stage 3b chronic kidney disease ([place]); Essential hypertension; Gastroesophageal reflux disease; Adnexal mass; Osteoporosis; Prediabetes; Single kidney; Allergic rhinitis; Leukocytosis; Peripheral neuropathy; Interstitial pulmonary disease ([place]); Chronic venous insufficiency; PAD (peripheral artery disease) ([place]); Lymphedema; Renal artery stenosis ([place]); Bipolar disorder, in full remission, most recent episode depressed ([place]); Chronic pain; Hypertriglyceridemia; Age-related osteoporosis without current pathological fracture; Coronary atherosclerosis; Drug-induced osteoporosis; Osteoarthritis of knee; Chronic pain of both shoulders; Lumbar radiculopathy; Impaired mobility; Urinary incontinence; History of lumbar fusion; Paroxysmal atrial fibrillation ([place]); Acute on chronic diastolic CHF (congestive heart failure) ([place]); Chronic heart failure with preserved ejection fraction ([place]); Iron deficiency anemia due to chronic blood loss; and History of major orthopedic surgery on their  problem list. Today she comes in for evaluation of her Arm Pain (both)  Pain Assessment: Location: Left, Right Arm Radiating: pain radiaties everywhere Onset: More than a month ago Duration: Chronic pain Quality: Aching, Constant, Burning, Throbbing, Stabbing, Shooting, Discomfort Severity: 7 /10 (subjective, self-reported pain score)  Effect on ADL: limits my daily activities Timing: Constant Modifying factors: Meds, BP: 108/62  HR: 90  Onset and Duration: Sudden and Date of onset: 2023 Cause of pain: Unknown Severity: Getting worse, NAS-11 at its worse: 8/10, NAS-11 at its best: 1/10, NAS-11 now: 6/10, and NAS-11 on the average: 8/10 Timing: Morning and During activity or exercise Aggravating Factors: Motion and Surgery made it worse Alleviating Factors: Medications Associated Problems: Inability to control bladder (urine), Inability to control bowel, Pain that wakes patient up, and Pain that does not allow patient to sleep Quality of Pain: Aching, Burning, Constant, Distressing, Pressure-like, Stabbing, Throbbing, and Toothache-like Previous Examinations or Tests: CT scan, X-rays, and Orthopedic evaluation Previous Treatments: Epidural steroid injections, Narcotic medications, Physical Therapy, Relaxation therapy, Steroid treatments by mouth, Strengthening exercises, Stretching exercises, TENS, and Trigger point injections  Kerri Carter is being evaluated for possible interventional pain management therapies for the treatment of her chronic pain.  Kerri Carter is a pleasant 82 year old female with a complex past medical history and multiple comorbidities contributing to her chronic pain syndrome.  Of note, she has lumbar spine fusion, history of right hip hemiarthroplasty, history of fracture of her left scapula, status post reverse total shoulder replacement on the right, osteoporosis, COPD, peripheral arterial disease, rheumatoid arthritis, single kidney, peripheral neuropathy and diffuse  osteoarthritis.  She has tried multiple medications with limited response.  She also has multiple drug allergies.  She states that she lost her husband 3 years ago which has been really difficult for her.  She has a history of TIAs and atrial fibrillation.  She follows up with psychiatry for conversion  disorder.  She has lumbar degenerative disc disease and history of falls.  She is currently on gabapentin managed by her primary care provider.  She presents today for chronic pain management hoping that we can manage her tramadol.  She takes 50 mg daily which she states takes the "edge off".  Given her kidney function and GFR of 35, would recommend dosing every 12 hours and not more frequently than that.  Avoid NSAIDs given chronic kidney disease and history of GI bleed.  Continue gabapentin as prescribed by PCP.  Avoid any stronger opioid analgesics.  Historic Controlled Substance Pharmacotherapy Review  PMP and historical list of controlled substances: Tramadol 50 mg daily Historical Monitoring: The patient  reports no history of drug use. List of prior UDS Testing: No results found for: "MDMA", "COCAINSCRNUR", "PCPSCRNUR", "PCPQUANT", "CANNABQUANT", "THCU", "ETH", "CBDTHCR", "D8THCCBX", "D9THCCBX" Historical Background Evaluation: [place] PMP: PDMP reviewed during this encounter. Review of the past 74-month conducted.              Department of public safety, offender search: (Editor, commissioningInformation) Non-contributory Risk Assessment Profile: Aberrant behavior: None observed or detected today Risk factors for fatal opioid overdose: sleep apnea and history of COPD, on oxygen Fatal overdose hazard ratio (HR): Calculation deferred Non-fatal overdose hazard ratio (HR): Calculation deferred Risk of opioid abuse or dependence: 0.7-3.0% with doses ? 36 MME/day and 6.1-26% with doses ? 120 MME/day. Substance use disorder (SUD) risk level: Moderate   Pharmacologic Plan: As per protocol, I have not taken over  any controlled substance management, pending the results of ordered tests and/or consults.            Initial impression: Pending review of available data and ordered tests.  Meds   Current Outpatient Medications:    albuterol (PROVENTIL) (2.5 MG/3ML) 0.083% nebulizer solution, Take by nebulization., Disp: , Rfl:    albuterol (VENTOLIN HFA) 108 (90 Base) MCG/ACT inhaler, Inhale 2 puffs into the lungs every 6 (six) hours as needed for wheezing or shortness of breath., Disp: 18 g, Rfl: 11   amLODipine (NORVASC) 5 MG tablet, Take 1 tablet (5 mg total) by mouth daily., Disp: 90 tablet, Rfl: 3   apixaban (ELIQUIS) 5 MG TABS tablet, Take 1 tablet (5 mg total) by mouth 2 (two) times daily., Disp: 180 tablet, Rfl: 1   budesonide (PULMICORT) 0.5 MG/2ML nebulizer solution, Take 2 mLs (0.5 mg total) by nebulization 2 (two) times daily., Disp: 120 mL, Rfl: 11   busPIRone (BUSPAR) 5 MG tablet, Take 1 tablet (5 mg total) by mouth 2 (two) times daily., Disp: , Rfl:    cetirizine (ZYRTEC ALLERGY) 10 MG tablet, Take 1 tablet (10 mg total) by mouth at bedtime., Disp: 30 tablet, Rfl: 2   Cholecalciferol (VITAMIN D3) 50 MCG (2000 UT) TABS, Take 4,000 Units by mouth daily., Disp: 90 tablet, Rfl: 3   dicyclomine (BENTYL) 10 MG capsule, TAKE 1 CAPSULE (10 MG TOTAL) BY MOUTH 2 (TWO) TIMES DAILY AS NEEDED FOR SPASMS., Disp: 180 capsule, Rfl: 2   escitalopram (LEXAPRO) 10 MG tablet, Take 1 tablet (10 mg total) by mouth daily., Disp: 90 tablet, Rfl: 3   famotidine (PEPCID) 20 MG tablet, TAKE 1 TABLET (20 MG TOTAL) BY MOUTH DAILY. BEFORE BREAKFAST OR DINNER, Disp: 90 tablet, Rfl: 1   ferrous sulfate 325 (65 FE) MG EC tablet, Take 1 tablet (325 mg total) by mouth 2 (two) times daily. (Patient taking differently: Take 325 mg by mouth 2 (two) times daily. Pt reports MD  instructed her to take twice a day every other day.), Disp: 60 tablet, Rfl: 3   gabapentin (NEURONTIN) 300 MG capsule, Take 300 mg in the morning and 600 mg at  night., Disp: , Rfl:    lamoTRIgine (LAMICTAL) 100 MG tablet, TAKE 1 TAB 2 TIMES DAILY. FURTHER REFILLS NEW PSYCH FOR ALL PSYCH MEDS ONLY TEMP SUPPLY FROM PCP, Disp: 180 tablet, Rfl: 1   leflunomide (ARAVA) 20 MG tablet, Take 1 tablet (20 mg total) by mouth daily., Disp: , Rfl:    lidocaine (LIDODERM) 5 %, Place 1 patch onto the skin 2 (two) times daily as needed. Remove & Discard patch within 12 hours or as directed by MD (Patient taking differently: Place 1 patch onto the skin daily. Remove & Discard patch within 12 hours or as directed by MD), Disp: 120 patch, Rfl: 2   lovastatin (MEVACOR) 20 MG tablet, TAKE 1 TABLET BY MOUTH EVERYDAY AT BEDTIME *STOP TALKING 40MG* (Patient taking differently: Take 20 mg by mouth at bedtime.), Disp: 90 tablet, Rfl: 2   methocarbamol (ROBAXIN) 500 MG tablet, Take 500 mg by mouth 4 (four) times daily as needed. Taking twice a day currently, Disp: , Rfl:    mometasone (NASONEX) 50 MCG/ACT nasal spray, 1 spray to each nostril twice a day, Disp: 1 each, Rfl: 3   montelukast (SINGULAIR) 10 MG tablet, TAKE 1 TABLET BY MOUTH EVERY DAY (Patient taking differently: Take 10 mg by mouth daily.), Disp: 90 tablet, Rfl: 1   multivitamin-lutein (OCUVITE-LUTEIN) CAPS capsule, Take 1 capsule by mouth at bedtime., Disp: , Rfl:    nebivolol (BYSTOLIC) 5 MG tablet, Take 1 tablet (5 mg total) by mouth in the morning and at bedtime. (Note dose changed from 1/2 10 mg bid to 5 mg bid), Disp: 180 tablet, Rfl: 3   OXYGEN, Inhale 3 L into the lungs at bedtime., Disp: , Rfl:    pantoprazole (PROTONIX) 40 MG tablet, TAKE 1 TABLET BY MOUTH 2 TIMES DAILY 30 MIN BEFORE FOOD (NOTE REDUCTION IN FREQUENCY), Disp: 180 tablet, Rfl: 1   predniSONE (DELTASONE) 5 MG tablet, TAKE 1 TABLET BY MOUTH EVERY DAY WITH BREAKFAST, Disp: 30 tablet, Rfl: 3   QUEtiapine (SEROQUEL) 25 MG tablet, TAKE 1 TABLET (25 MG TOTAL) BY MOUTH AT BEDTIME. AGAIN LAST FILL FURTHER REFILLS FROM PSYCHIATRY NO EXCEPTIONS, Disp: 90  tablet, Rfl: 0   sucralfate (CARAFATE) 1 g tablet, TAKE 1 TABLET BY MOUTH 2 TIMES DAILY., Disp: 180 tablet, Rfl: 2   torsemide (DEMADEX) 20 MG tablet, Take 1 tablet (20 mg total) by mouth daily., Disp: 30 tablet, Rfl: 3   Vibegron (GEMTESA) 75 MG TABS, Take 75 mg by mouth daily., Disp: 90 tablet, Rfl: 3  Imaging Review    Narrative CLINICAL DATA:  Neck trauma (Age >= 65y)  EXAM: CT CERVICAL SPINE WITHOUT CONTRAST  TECHNIQUE: Multidetector CT imaging of the cervical spine was performed without intravenous contrast. Multiplanar CT image reconstructions were also generated.  COMPARISON:  None.  FINDINGS: Alignment: Normal  Skull base and vertebrae: No acute fracture. No primary bone lesion or focal pathologic process.  Soft tissues and spinal canal: No prevertebral fluid or swelling. No visible canal hematoma.  Disc levels: Diffuse degenerative disc disease. Moderate to advanced bilateral degenerative facet disease, right greater than left.  Upper chest: Biapical scarring.  Other: None  IMPRESSION: Degenerative disc and facet disease.  No acute bony abnormality.   Electronically Signed By: Rolm Baptise M.D. On: 02/10/2021 20:16  MR SHOULDER  RIGHT WO CONTRAST  Narrative CLINICAL DATA:  Decreased range of motion, weakness  EXAM: MRI OF THE RIGHT SHOULDER WITHOUT CONTRAST  TECHNIQUE: Multiplanar, multisequence MR imaging of the shoulder was performed. No intravenous contrast was administered.  COMPARISON:  None.  FINDINGS: Rotator cuff: Mild tendinosis of the supraspinatus tendon with a small partial-thickness bursal surface tear. Mild tendinosis of the infraspinatus tendon. Teres minor tendon is intact. Subscapularis tendon is intact.  Muscles: No atrophy or fatty replacement of nor abnormal signal within, the muscles of the rotator cuff.  Biceps long head:  Intact.  Acromioclavicular Joint: Mild arthropathy of the acromioclavicular joint. Type II  acromion. Moderate amount of subacromial/subdeltoid bursal fluid as can be seen with bursitis.  Glenohumeral Joint: Large joint effusion with synovitis. Partial-thickness cartilage loss of the glenohumeral joint.  Labrum:  Intact.  Bones: Subchondral serpiginous signal abnormality in the humeral head with surrounding marrow edema most consistent with avascular necrosis without articular surface collapse.  Other: No fluid collection or hematoma.  IMPRESSION: 1. Avascular necrosis of right humeral head with surrounding marrow edema. No articular surface collapse. 2. Large glenohumeral joint effusion with synovitis. 3. Mild osteoarthritis of the glenohumeral joint. 4. Mild tendinosis of the supraspinatus tendon with a small partial-thickness bursal surface tear. 5. Mild tendinosis of the infraspinatus tendon. 6. Subacromial/subdeltoid bursitis.   Electronically Signed By: Kathreen Devoid On: 06/30/2017 14:38   MR SHOULDER LEFT WO CONTRAST  Narrative CLINICAL DATA:  Left shoulder pain for several months. Limited range of motion.  EXAM: MRI OF THE LEFT SHOULDER WITHOUT CONTRAST  TECHNIQUE: Multiplanar, multisequence MR imaging of the shoulder was performed. No intravenous contrast was administered.  COMPARISON:  None.  FINDINGS: Examination is limited by motion artifact.  Rotator cuff: Large full-thickness retracted rotator cuff tear. The infraspinatus tendon is torn and retracted approximately 3 cm. Most of the supraspinatus tendon is also torn and retracted a maximum of 10 mm. The subscapularis tendon appears intact. Moderate tendinopathy.  Muscles: Marked edema like signal abnormality and fluid tracking back along and around the infraspinatus muscle. No fatty atrophy of the supraspinatus muscle.  Biceps long head: Difficult to identify the entire intra-articular portion of the long head biceps tendon for certain but I think it is intact.  Acromioclavicular  Joint: No degenerative changes. Type 1-2 acromion. No lateral downsloping or undersurface spurring.  Glenohumeral Joint: Moderate degenerative changes with small joint effusion and mild synovitis. The humeral head is riding high in the glenoid fossa.  Labrum:  No obvious labral tears.  Bones: Moderate-sized focus of AVN involving the humeral head. This is likely chronic as there is no surrounding marrow edema. There is fluid surrounding part of the lesions suggesting it is loose or potentially loose.  Other: Expected fluid in the subacromial/subdeltoid bursa.  IMPRESSION: 1. Large full-thickness retracted rotator cuff tear as detailed above. There also significant edema/inflammation/fluid tracking back along the infraspinatus muscle. 2. Intact long head biceps tendon and grossly normal glenoid labrum. 3. Moderate-sized focus of AVN involving the humeral head. 4. Glenohumeral joint degenerative changes with small joint effusion and mild synovitis.   Electronically Signed By: Marijo Sanes M.D. On: 01/05/2018 17:11  Shoulder-R CT w contrast: No results found for this or any previous visit.   Narrative CLINICAL DATA:  Left shoulder pain following left shoulder arthroplasty in June of 2020.  EXAM: CT OF THE UPPER LEFT EXTREMITY WITHOUT CONTRAST  TECHNIQUE: Multidetector CT imaging of the upper left extremity was performed according to the standard  protocol.  COMPARISON:  X-ray 01/10/2019  FINDINGS: Bones/Joint/Cartilage  Status post reverse left shoulder arthroplasty. Arthroplasty components appear in their expected alignment without evidence of periprosthetic lucency or fracture. No appreciable periprosthetic fluid collection. Fracture through the midportion of the acromion with 5 mm of diastasis at the fracture site and approximately 7 mm of posterior displacement (series 4, image 8; series 8, image 50). Fracture margins are corticated. There are multiple small  osseous fragments at the fracture site with prominent soft tissue thickening suggesting pseudoarthrosis. AC joint intact and appears unremarkable. Degenerative changes within the visualized cervicothoracic spine.  Ligaments  Suboptimally assessed by CT.  Muscles and Tendons  No appreciable musculotendinous abnormality.  Soft tissues  No soft tissue fluid collection or hematoma. There is a round cutaneous/subcutaneous soft tissue density lesion at the midline of the chest wall just above the level of the manubrium measuring 13 mm in diameter (series 2, image 47), possibly sebaceous or epidermoid cyst. Aortic atherosclerosis.  IMPRESSION: 1. Left acromial stress fracture with 5 mm of diastasis at the fracture site and approximately 7 mm of posterior displacement. Fracture margins are corticated. There are multiple small osseous fragments at the fracture site with prominent soft tissue thickening suggesting pseudoarthrosis. 2. Status post reverse left shoulder arthroplasty without evidence of periprosthetic lucency or periprosthetic fracture. 3. There is a round cutaneous/subcutaneous soft tissue density lesion at the midline of the chest wall just above the level of the manubrium measuring 13 mm in diameter, possibly sebaceous or epidermoid cyst. Correlate with physical exam. 4. Aortic atherosclerosis. (ICD10-I70.0).   Electronically Signed By: Davina Poke D.O. On: 09/05/2019 08:27  Shoulder-R DG: Results for orders placed during the hospital encounter of 07/17/21  DG Shoulder Right  Narrative CLINICAL DATA:  Right arm pain after fall.  EXAM: RIGHT SHOULDER - 2+ VIEW  COMPARISON:  None Available.  FINDINGS: Status post right total shoulder arthroplasty. Glenoid and humeral components are well situated. Old right rib fractures are noted. No acute fracture or dislocation is noted.  IMPRESSION: No acute abnormality is noted.   Electronically Signed By:  Marijo Conception M.D. On: 07/17/2021 14:19  Shoulder-L DG: Results for orders placed during the hospital encounter of 07/28/21  DG Shoulder Left  Narrative CLINICAL DATA:  Left shoulder hump. Multiple prior left shoulder surgeries.  EXAM: LEFT SHOULDER - 2+ VIEW; LEFT HUMERUS - 2+ VIEW  COMPARISON:  Left shoulder x-rays dated December 21, 2019.  FINDINGS: Prior reverse total shoulder arthroplasty. No evidence of hardware failure or loosening. Prior ORIF of the acromion with displacement of the distal acromion fragment from the hardware, new since postoperative x-rays from November 2021. Hardware approximates the skin surface. Heterotopic ossification posterior to the arthroplasty. The mid to distal humerus and elbow are unremarkable.  IMPRESSION: 1. Failed ORIF of the acromion with displacement of the distal acromion fragment from the hardware, new since postoperative x-rays from December 21, 2019. 2. Prior reverse total shoulder arthroplasty without hardware complication.   Electronically Signed By: Titus Dubin M.D. On: 07/31/2021 11:09  MR LUMBAR SPINE WO CONTRAST  Narrative CLINICAL DATA:  Chronic low back pain  EXAM: MRI LUMBAR SPINE WITHOUT CONTRAST  TECHNIQUE: Multiplanar, multisequence MR imaging of the lumbar spine was performed. No intravenous contrast was administered.  COMPARISON:  07/26/2020  FINDINGS: Segmentation:  Standard.  Alignment: Minimal grade 1 anterolisthesis of L4 on L5. 2 mm retrolisthesis of L3 on L4.  Vertebrae: No acute fracture, evidence of discitis, or aggressive bone lesion.  Conus medullaris and cauda equina: Conus extends to the T11-12 level. Conus and cauda equina appear normal.  Paraspinal and other soft tissues: No acute paraspinal abnormality.  Disc levels:  Disc spaces: Degenerative disease with severe disc height loss at L2-3, L3-4 and L5-S1. Posterior lumbar interbody fusion at L4-5.  T12-L1: Minimal  broad-based disc bulge. No foraminal or central canal stenosis.  L1-L2: Mild broad-based disc bulge. No foraminal or central canal stenosis.  L2-L3: Left laminectomy. Broad-based disc bulge with a left paracentral disc protrusion with mass effect on the left intraspinal L3 nerve roots. Mild left foraminal stenosis. Mild spinal stenosis. Mild bilateral facet arthropathy.  L3-L4: Broad-based disc bulge with a large broad central disc protrusion and a large right paracentral disc extrusion with caudal migration of disc material resulting in mass effect upon the right intraspinal L4 nerve roots. Mild bilateral facet arthropathy. Severe multifactorial spinal stenosis. Moderate bilateral foraminal stenosis.  L4-L5: Posterior lumbar interbody fusion with a broad disc bulge. No foraminal or central canal stenosis.  L5-S1: Broad-based disc bulge. Moderate bilateral facet arthropathy. Moderate bilateral foraminal stenosis.  IMPRESSION: 1. At L2-3 there is a left laminectomy. Broad-based disc bulge with a left paracentral disc protrusion with mass effect on the left intraspinal L3 nerve roots. Mild left foraminal stenosis. Mild spinal stenosis. Mild bilateral facet arthropathy. 2. At L3-4 there is a broad-based disc bulge with a large broad central disc protrusion and a large right paracentral disc extrusion with caudal migration of disc material resulting in mass effect upon the right intraspinal L4 nerve roots. Severe multifactorial spinal stenosis. Moderate bilateral foraminal stenosis. 3. Posterior lumbar interbody fusion at L4-5 with a broad disc bulge. No foraminal or central canal stenosis. 4. At L5-S1 there is a broad-based disc bulge. Moderate bilateral facet arthropathy. Moderate bilateral foraminal stenosis.   Electronically Signed By: Kathreen Devoid M.D. On: 04/04/2021 15:41  CT Lumbar Spine Wo Contrast  Narrative CLINICAL DATA:  Low back pain,  mechanical  fall  EXAM: CT LUMBAR SPINE WITHOUT CONTRAST  TECHNIQUE: Multidetector CT imaging of the lumbar spine was performed without intravenous contrast administration. Multiplanar CT image reconstructions were also generated.  COMPARISON:  07/26/2020  FINDINGS: Segmentation: The lowest lumbar type non-rib-bearing vertebra is labeled as L5.  Alignment: 3 mm fused anterolisthesis at L4-5, unchanged. There is 2 mm of degenerative retrolisthesis at L2-3 and L3-4, unchanged.  Vertebrae: Posterolateral rod and pedicle screw fixation at L4-5 bilaterally with interbody spacer and interbody graft material. No abnormal lucency along the pedicle screws. Stable appearance of mild scalloping of the superior endplate of L4. Substantial facet arthropathy at L4-5 and L5-S1 bilaterally. Unfused ossicle versus old nonunited fracture the tip of the spinous process of L4, unchanged from prior. No acute lumbar spine fracture is observed.  Paraspinal and other soft tissues: Atherosclerosis is present, including aortoiliac atherosclerotic disease. Absent right kidney.  Disc levels: The findings at individual intervertebral levels are similar to 6/20 4/22.  L1-2: No impingement.  Mild disc bulge.  L2-3: Possible bilateral subarticular lateral recess stenosis and likely moderate central narrowing of the thecal sac due to diffuse disc bulge and facet arthropathy.  L3-4: Mild to moderate central narrowing of the thecal sac and mild left foraminal stenosis due to disc bulge, facet arthropathy, and short pedicles.  L4-5: Mild bilateral foraminal stenosis due to disc uncovering and facet arthropathy. Prior left laminectomy.  L5-S1: Mild bilateral foraminal stenosis due to intervertebral and facet spurring.  IMPRESSION: 1. No acute fracture or acute  abnormality observed. 2. Lumbar spondylosis and degenerative disc disease causing moderate impingement at L2-3, mild to moderate impingement at L3-4, and  mild impingement at L4-5 and L5-S1, as noted above. 3. Prior L4-5 fusion. 4.  Aortic Atherosclerosis (ICD10-I70.0).   Electronically Signed By: Van Clines M.D. On: 11/20/2020 14:11  DG Lumbar Spine 2-3 Views  Narrative CLINICAL DATA:  Lumbar fusion 05/22/2021. Lumbar radiculopathy, status post fusion.  EXAM: LUMBAR SPINE - 2-3 VIEW  COMPARISON:  Preoperative MRI 04/04/2021  FINDINGS: There are 5 non-rib-bearing lumbar vertebra. Posterior rod with intrapedicular screw fusion from L2 through L5. Lucency adjacent to the L3 pedicle screws measures up to 3 mm. There are interbody spacers in place. Trace anterolisthesis of L4 on L5 is stable from prior MRI. No evidence of fracture or bony destructive change. The bones are diffusely under mineralized.  IMPRESSION: 1. Posterior lumbar rod and intrapedicular screw fusion from L2 through L5. Lucency of up to 3 mm adjacent to the L3 pedicle screws may be loosening. 2. Trace anterolisthesis of L4 on L5 is unchanged from prior MRI.   Electronically Signed By: Keith Rake M.D. On: 10/25/2021 09:27   DG Hip Unilat W or Wo Pelvis 2-3 Views Right  Narrative CLINICAL DATA:  Right hip pain after fall last Thursday. Unable to ambulate.  EXAM: DG HIP (WITH OR WITHOUT PELVIS) 2-3V RIGHT  COMPARISON:  CT abdomen pelvis dated February 10, 2021.  FINDINGS: No acute fracture or dislocation. Bilateral hip arthroplasties and lumbar fusion. No evidence of hardware failure or loosening. Soft tissues are unremarkable.  IMPRESSION: 1. No acute osseous abnormality.   Electronically Signed By: Titus Dubin M.D. On: 09/08/2021 08:42  Hip-L DG 2-3 views: Results for orders placed during the hospital encounter of 11/22/20  DG Hip Unilat W or Wo Pelvis 2-3 Views Left  Narrative CLINICAL DATA:  Pt comes into the ED via EMS from home, pt c/o left hip pain, had recent fall 2 days ago and was seen here 10/19 for same. Pain  secondary to a fall 2 days ago.  EXAM: DG HIP (WITH OR WITHOUT PELVIS) 2-3V LEFT  COMPARISON:  10/29/2020  FINDINGS: Bilateral hip prosthetics. No fracture dislocation within the pelvis or femurs. Posterior lumbar fusion. Dedicated views of the LEFT hip demonstrate no fracture or dislocation.  IMPRESSION: No acute osseous abnormality.   Electronically Signed By: Suzy Bouchard M.D. On: 11/22/2020 08:58  DG Knee Complete 4 Views Left  Narrative CLINICAL DATA:  Fall  EXAM: LEFT KNEE - COMPLETE 4+ VIEW  COMPARISON:  None.  FINDINGS: Negative for fracture or joint effusion. Mild marginal spurring medially. No significant joint space narrowing.  IMPRESSION: Negative for fracture.  Mild degenerative change medially   Electronically Signed By: Franchot Gallo M.D. On: 11/20/2020 16:31   DG Ankle Complete Right  Narrative 3 views of a skeletally mature individual were obtained of the right ankle.  Study includes AP, oblique, lateral projections.  Screw is seen in the forefoot likely prior surgery. There is no definitive evidence of acute fracture or stress fracture identified at this time. Joint space is maintained.   Narrative Please see detailed radiograph report in office note.   Narrative CLINICAL DATA:  Right foot pain after fall last Thursday. Unable to ambulate.  EXAM: RIGHT FOOT COMPLETE - 3+ VIEW  COMPARISON:  Right ankle x-rays dated January 03, 2015.  FINDINGS: Acute minimally impacted fracture at the base of the fifth proximal phalanx. No additional fracture. No dislocation. Postsurgical  changes of the first and third metatarsals. Mild degenerative changes of the forefoot. Osteopenia. Dorsal forefoot soft tissue swelling.  IMPRESSION: 1. Acute minimally impacted fracture at the base of the fifth proximal phalanx.   Electronically Signed By: Titus Dubin M.D. On: 09/08/2021 08:46   Narrative CLINICAL DATA:  Trip and fall with  left foot pain, initial encounter  EXAM: LEFT FOOT - COMPLETE 3+ VIEW  COMPARISON:  None.  FINDINGS: There is no evidence of fracture or dislocation. There is no evidence of arthropathy or other focal bone abnormality. Soft tissues are unremarkable.  IMPRESSION: No acute abnormality noted.   Electronically Signed By: Inez Catalina M.D. On: 12/24/2015 16:58   Complexity Note: Imaging results reviewed.                         ROS  Cardiovascular: Abnormal heart rhythm, High blood pressure, Heart failure, and Heart valve problems Pulmonary or Respiratory: Lung problems, Wheezing and difficulty taking a deep full breath (Asthma), Shortness of breath, Snoring , and Coughing up mucus (Bronchitis) Neurological: Stroke (Residual deficits or weakness: 2013), Abnormal skin sensations (Peripheral Neuropathy), and Incontinence:  Urinary Psychological-Psychiatric: Anxiousness, Depressed, and Difficulty sleeping and or falling asleep Gastrointestinal: Heartburn due to stomach pushing into lungs (Hiatal hernia), Reflux or heatburn, and Alternating episodes iof diarrhea and constipation (IBS-Irritable bowe syndrome) Genitourinary: Kidney disease Hematological: Weakness due to low blood hemoglobin or red blood cell count (Anemia) and Brusing easily Endocrine: No reported endocrine signs or symptoms such as high or low blood sugar, rapid heart rate due to high thyroid levels, obesity or weight gain due to slow thyroid or thyroid disease Rheumatologic: Joint aches and or swelling due to excess weight (Osteoarthritis) and Rheumatoid arthritis Musculoskeletal: Negative for myasthenia gravis, muscular dystrophy, multiple sclerosis or malignant hyperthermia Work History: Retired  Allergies  Ms. Wikstrom is allergic to ceftin [cefuroxime axetil]; lisinopril; sulfa antibiotics; sulfasalazine; morphine; xarelto [rivaroxaban]; adhesive [tape]; antihistamines, chlorpheniramine-type; antivert [meclizine hcl];  aspirin; contrast media [iodinated contrast media]; decongestant [pseudoephedrine hcl]; doxycycline; levaquin [levofloxacin in d5w]; polymyxin b; and tetanus toxoids.  Laboratory Chemistry Profile   Renal Lab Results  Component Value Date   BUN 27 (H) 12/09/2021   CREATININE 1.50 (H) 12/09/2021   BCR 11 11/22/2020   GFR 35.66 (L) 07/03/2021   GFRAA 39 (L) 02/21/2019   GFRNONAA 35 (L) 12/09/2021   SPECGRAV 1.015 10/12/2017   PHUR 6.5 10/12/2017   PROTEINUR NEGATIVE 09/08/2021     Electrolytes Lab Results  Component Value Date   NA 139 12/09/2021   K 3.4 (L) 12/09/2021   CL 96 (L) 12/09/2021   CALCIUM 9.1 12/09/2021   MG 2.5 (H) 09/12/2021   PHOS 3.5 05/27/2021     Hepatic Lab Results  Component Value Date   AST 32 12/09/2021   ALT 18 12/09/2021   ALBUMIN 3.9 12/09/2021   ALKPHOS 84 12/09/2021   LIPASE 33 02/06/2019     ID Lab Results  Component Value Date   HIV Non Reactive 05/26/2021   SARSCOV2NAA NEGATIVE 09/08/2021   STAPHAUREUS NEGATIVE 05/26/2021   MRSAPCR NEGATIVE 05/26/2021     Bone Lab Results  Component Value Date   VD25OH 56.64 04/02/2020     Endocrine Lab Results  Component Value Date   GLUCOSE 186 (H) 12/09/2021   GLUCOSEU NEGATIVE 09/08/2021   HGBA1C 6.0 (H) 05/28/2021   TSH 2.53 07/03/2021   FREET4 1.19 04/02/2020     Neuropathy Lab Results  Component  Value Date   VITAMINB12 325 10/09/2021   FOLATE 14.4 10/09/2021   HGBA1C 6.0 (H) 05/28/2021   HIV Non Reactive 05/26/2021     CNS No results found for: "COLORCSF", "APPEARCSF", "RBCCOUNTCSF", "WBCCSF", "POLYSCSF", "LYMPHSCSF", "EOSCSF", "PROTEINCSF", "GLUCCSF", "JCVIRUS", "CSFOLI", "IGGCSF", "LABACHR", "ACETBL"   Inflammation (CRP: Acute  ESR: Chronic) Lab Results  Component Value Date   ESRSEDRATE 10 07/03/2013   LATICACIDVEN 3.2 (Ducor) 09/08/2021     Rheumatology No results found for: "RF", "ANA", "LABURIC", "URICUR", "LYMEIGGIGMAB", "LYMEABIGMQN", "HLAB27"   Coagulation Lab  Results  Component Value Date   INR 0.9 12/10/2020   LABPROT 12.6 12/10/2020   APTT 26 12/10/2020   PLT 273 03/10/2022   DDIMER 1.38 (H) 07/29/2021     Cardiovascular Lab Results  Component Value Date   BNP 345.9 (H) 09/08/2021   CKTOTAL 58 11/24/2013   CKMB 0.7 11/24/2013   TROPONINI <0.03 05/07/2016   HGB 12.0 03/10/2022   HCT 38.1 03/10/2022     Screening Lab Results  Component Value Date   SARSCOV2NAA NEGATIVE 09/08/2021   COVIDSOURCE NASOPHARYNGEAL 07/22/2018   STAPHAUREUS NEGATIVE 05/26/2021   MRSAPCR NEGATIVE 05/26/2021   HIV Non Reactive 05/26/2021     Cancer No results found for: "CEA", "CA125", "LABCA2"   Allergens No results found for: "ALMOND", "APPLE", "ASPARAGUS", "AVOCADO", "BANANA", "BARLEY", "BASIL", "BAYLEAF", "GREENBEAN", "LIMABEAN", "WHITEBEAN", "BEEFIGE", "REDBEET", "BLUEBERRY", "BROCCOLI", "CABBAGE", "MELON", "CARROT", "CASEIN", "CASHEWNUT", "CAULIFLOWER", "CELERY"     Note: Lab results reviewed.  PFSH  Drug: Ms. Rasnake  reports no history of drug use. Alcohol:  reports no history of alcohol use. Tobacco:  reports that she quit smoking about 48 years ago. Her smoking use included cigarettes. She has a 10.00 pack-year smoking history. She has never used smokeless tobacco. Medical:  has a past medical history of Acromial process of scapula fracture (12/21/2019), Acute kidney injury superimposed on chronic kidney disease ([place]) (05/26/2021), Acute on chronic combined systolic (congestive) and diastolic (congestive) heart failure ([place]) (07/28/2021), Acute on chronic combined systolic and diastolic CHF (congestive heart failure) ([place]) (05/26/2021), Acute on chronic respiratory failure with hypoxia ([place]) (09/10/2021), Acute pain of left knee (12/18/2020), Anemia in stage 3a chronic kidney disease ([place]) (10/09/2021), Anxiety, Anxiety (01/20/2019), Anxiety and depression (05/26/2021), Aortic stenosis (07/09/2015), Arrhythmia, Assistance needed for mobility  (12/18/2020), Asthma, At high risk for falls (03/16/2019), Avascular necrosis of left shoulder due to adverse effect of steroid therapy ([place]) (01/20/2018), Bipolar disorder ([place]), C. difficile diarrhea (2010), Carotid atherosclerosis, bilateral, Cataract, CHF (congestive heart failure) ([place]), Chicken pox, Chronic left shoulder pain (08/29/2019), Chronic respiratory failure with hypoxia ([place]) (10/01/2021), CKD (chronic kidney disease), stage III ([place]), Closed fracture of acromial process of scapula (10/28/2018), Closed nondisplaced fracture of proximal phalanx of lesser toe of right foot, Collapsed vertebra, not elsewhere classified, lumbar region, initial encounter for fracture ([place]) (02/21/2019), Community acquired pneumonia ([id number]), Complicated grief ([id number]), Compression fracture of L4 vertebra ([place]) (02/21/2019), Conversion disorder, Conversion disorder (08/04/2015), COPD (chronic obstructive pulmonary disease) ([place]), COPD with exacerbation ([place]) (08/20/2021), Cough (05/02/2012), Depression, Dislocation of hip joint prosthesis ([place]) (01/08/2017), Elevated brain natriuretic peptide (BNP) level (12/18/2020), Elevated d-dimer (05/26/2021), Episodic mood disorder ([place]) (04/18/2019), Essential hypertension, Fatigue (07/07/2018), Frequent falls (12/18/2020), GERD (gastroesophageal reflux disease), H/O cardiac catheterization (02/24/2011), Hav (hallux abducto valgus), unspecified laterality (12/05/2018), Heart murmur, Hip fracture ([place]) (10/31/2016), Hip pain (12/18/2020), History of fracture of left hip (01/15/2017), History of syncope (08/25/2016), Hyperkalemia (05/26/2021), Hyperlipidemia, Hypokalemia (08/25/2016), ILD (interstitial lung disease) ([place]), Iliopsoas bursitis of right hip (08/29/2019), Inflammatory arthritis, Insomnia (  07/17/2015), Left foot pain (10/19/2016), Left lower lobe pneumonia (05/26/2021), Leg edema (05/02/2019), Leg pain, bilateral, Leg swelling (12/15/2016), Leukocytosis  (10/19/2017), Leukopenia (06/24/2021), Lymphocytosis (07/13/2021), Macular degeneration, Microcytic anemia (08/15/2015), Mixed bipolar I disorder ([place]) (10/09/2015), Nocturnal hypoxemia, Nocturnal hypoxemia (09/09/2011), Non-Obstructive CAD, NSIP (nonspecific interstitial pneumonitis) ([place]) (12/08/2019), Osteoarthritis, Other shoulder lesions, left shoulder (12/20/2017), Other specified postprocedural states (07/20/2013), Overweight (BMI 25.0-29.9) (08/29/2019), PAD (peripheral artery disease) ([place]), Pain due to onychomycosis of toenails of both feet (12/05/2018), Pelvic mass in female (03/16/2019), Physical deconditioning (02/27/2021), Pleural effusion on left (12/18/2020), PUD (peptic ulcer disease), Recurrent falls (09/01/2021), Recurrent pneumonia (07/23/2021), Repeated falls (12/18/2020), Respiratory failure with hypoxia ([place]) (01/12/2021), Rotator cuff tendinitis, right (07/26/2017), S/P lumbar fusion (05/23/2021), S/P right hip fracture, Sepsis due to pneumonia ([place]) (07/22/2021), Shoulder pain, Sleep apnea, SOB (shortness of breath) (10/13/2011), Spinal stenosis at L4-L5 level, Spinal stenosis of lumbar region (09/15/2016), Stage 3a chronic kidney disease (CKD) ([place]) (07/23/2021), Status post hip hemiarthroplasty (01/08/2017), Status post lumbar spinal fusion (01/15/2017), Status post reverse arthroplasty of shoulder, left (07/26/2018), Status post reverse total shoulder replacement, right (11/04/2017), Strain of right hip (02/26/2017), Thrombocytosis (06/24/2021), Toe fracture, right (09/09/2021), Toxic maculopathy, 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 Family: family history includes Alcohol abuse in her brother; Asthma in her mother and sister; Breast cancer in her sister; Cancer in her maternal grandmother; Colon cancer in her maternal grandmother; Dementia in her son; Depression in her brother;  Diabetes in her maternal grandmother and son; Gout in her son; Heart attack in her father and sister; Heart disease in her brother, brother, father, maternal grandmother, mother, sister, sister, and sister; Hypertension in her father and mother; Lung cancer in her sister; Parkinson's disease in her mother; Peripheral Artery Disease in her father; Rheum arthritis in her mother; Stroke in her mother.  Past Surgical History:  Procedure Laterality Date   APPENDECTOMY     APPLICATION OF INTRAOPERATIVE CT SCAN N/A 05/23/2021   Procedure: APPLICATION OF INTRAOPERATIVE CT SCAN;  Surgeon: Meade Maw, MD;  Location: [facility];  Service: Neurosurgery;  Laterality: N/A;   BACK SURGERY     lumbar fusion   BUNIONECTOMY Right    CATARACT EXTRACTION, BILATERAL     CESAREAN SECTION     x1   CHOLECYSTECTOMY N/A 05/11/2016   Procedure: LAPAROSCOPIC CHOLECYSTECTOMY;  Surgeon: Florene Glen, MD;  Location: [facility];  Service: General;  Laterality: N/A;   COLONOSCOPY WITH PROPOFOL N/A 04/02/2016   Procedure: COLONOSCOPY WITH PROPOFOL;  Surgeon: Jonathon Bellows, MD;  Location: [facility];  Service: Endoscopy;  Laterality: N/A;   ENDOSCOPIC RETROGRADE CHOLANGIOPANCREATOGRAPHY (ERCP) WITH PROPOFOL N/A 05/08/2016   Procedure: ENDOSCOPIC RETROGRADE CHOLANGIOPANCREATOGRAPHY (ERCP) WITH PROPOFOL;  Surgeon: Lucilla Lame, MD;  Location: [facility];  Service: Endoscopy;  Laterality: N/A;   ERCP     with biliary spincterotomy 05/08/16 Dr. Allen Norris for choledocholithiasis    ESOPHAGEAL DILATION  04/02/2016   Procedure: ESOPHAGEAL DILATION;  Surgeon: Jonathon Bellows, MD;  Location: [facility];  Service: Endoscopy;;   ESOPHAGOGASTRODUODENOSCOPY (EGD) WITH PROPOFOL N/A 04/02/2016   Procedure: ESOPHAGOGASTRODUODENOSCOPY (EGD) WITH PROPOFOL;  Surgeon: Jonathon Bellows, MD;  Location: [facility];  Service: Endoscopy;  Laterality: N/A;   HIP ARTHROPLASTY Right 11/01/2016   Procedure: ARTHROPLASTY BIPOLAR HIP (HEMIARTHROPLASTY);  Surgeon:  Corky Mull, MD;  Location: [facility];  Service: Orthopedics;  Laterality: Right;   LUMBAR LAMINECTOMY/DECOMPRESSION MICRODISCECTOMY N/A 12/11/2020   Procedure: LEFT L2-3 MICRODISCECTOMY, L3-4 AND L5-S1  DECOMPRESSION;  Surgeon: Meade Maw, MD;  Location: [facility];  Service: Neurosurgery;  Laterality: N/A;   NEPHRECTOMY  1988   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   ORIF SCAPHOID FRACTURE Left 12/21/2019   Procedure: OPEN REDUCTION INTERNAL FIXATION (ORIF) OF LEFT SCAPULAR NONUNION WITH BONE GRAFT;  Surgeon: Corky Mull, MD;  Location: [facility];  Service: Orthopedics;  Laterality: Left;   osteoporosis     noted DEXA 08/19/16    REPLACEMENT TOTAL KNEE Right    REVERSE SHOULDER ARTHROPLASTY Right 11/04/2017   Procedure: REVERSE SHOULDER ARTHROPLASTY;  Surgeon: Corky Mull, MD;  Location: [facility];  Service: Orthopedics;  Laterality: Right;   REVERSE SHOULDER ARTHROPLASTY Left 07/26/2018   Procedure: REVERSE SHOULDER ARTHROPLASTY;  Surgeon: Corky Mull, MD;  Location: [facility];  Service: Orthopedics;  Laterality: Left;   TONSILLECTOMY     TOTAL HIP ARTHROPLASTY  12/10/11   [facility] left hip   TOTAL HIP ARTHROPLASTY Bilateral    TRANSFORAMINAL LUMBAR INTERBODY FUSION (TLIF) WITH PEDICLE SCREW FIXATION 3 LEVEL N/A 05/23/2021   Procedure: OPEN L2-4 TRANSFORAMINAL LUMBAR INTERBODY FUSION (TLIF) WITH L2-5 POSTERIOR SPINAL FUSION;  Surgeon: Meade Maw, MD;  Location: [facility];  Service: Neurosurgery;  Laterality: N/A;   TUBAL LIGATION     Active Ambulatory Problems    Diagnosis Date Noted   Chronic obstructive pulmonary disease (COPD) ([place]) 09/09/2011   OSA (obstructive sleep apnea) 09/09/2011   Valvular heart disease 10/13/2011   Rheumatoid arthritis ([place]) 12/05/2014   Hyperlipidemia 07/17/2015   Macular degeneration 07/17/2015   Stage 3b chronic kidney disease ([place])    Essential hypertension 06/16/2019   Gastroesophageal reflux disease 02/24/2011    Adnexal mass 08/25/2016   Osteoporosis 08/27/2016   Prediabetes 08/25/2016   Single kidney 02/24/2011   Allergic rhinitis 09/28/2017   Leukocytosis 10/19/2017   Peripheral neuropathy 10/28/2016   Interstitial pulmonary disease ([place]) 07/07/2018   Chronic venous insufficiency 05/25/2019   PAD (peripheral artery disease) ([place]) 05/25/2019   Lymphedema 06/05/2019   Renal artery stenosis ([place]) 02/24/2011   Bipolar disorder, in full remission, most recent episode depressed ([place]) 07/13/2019   Chronic pain 06/16/2019   Hypertriglyceridemia 07/24/2019   Age-related osteoporosis without current pathological fracture 11/05/2016   Coronary atherosclerosis 08/25/2016   Drug-induced osteoporosis 08/27/2016   Osteoarthritis of knee 06/07/2013   Chronic pain of both shoulders 08/29/2019   Lumbar radiculopathy 12/11/2020   Impaired mobility 12/18/2020   Urinary incontinence 12/18/2020   History of lumbar fusion 05/23/2021   Paroxysmal atrial fibrillation ([place]) 07/29/2021   Acute on chronic diastolic CHF (congestive heart failure) ([place]) 08/20/2021   Chronic heart failure with preserved ejection fraction ([place]) 09/24/2021   Iron deficiency anemia due to chronic blood loss 10/09/2021   History of major orthopedic surgery 03/19/2022   Resolved Ambulatory Problems    Diagnosis Date Noted   Nocturnal hypoxemia 09/09/2011   SOB (shortness of breath) 10/13/2011   GI bleeding 01/19/2012   Cough 05/02/2012   COPD exacerbation ([place]) 04/03/2013   Solitary pulmonary nodule 12/05/2014   New onset headache 07/11/2015   DM type 2 (diabetes mellitus, type 2) ([place]) 07/17/2015   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   Insomnia 07/17/2015   Conversion disorder 08/04/2015   Microcytic anemia 08/15/2015   Chest pain 09/26/2015   Bronchial asthma 02/24/2011   Knee joint replacement status 07/20/2013   Peptic ulcer disease 02/24/2011   Otitis externa 10/03/2015   Mixed bipolar I  disorder ([place]) 10/09/2015   Acute exacerbation of chronic obstructive pulmonary disease (COPD) ([place]) 01/30/2016   LLQ pain 02/29/2016   Dyslipidemia 02/24/2011   Nonrheumatic aortic valve insufficiency 02/24/2011   Non-rheumatic mitral regurgitation 02/24/2011   Gallstone pancreatitis 05/07/2016   Abnormal liver enzymes    RUQ pain    Vomiting    Calculus of gallbladder and bile duct with obstruction without cholecystitis    Spinal stenosis of lumbar region 09/15/2016   Left foot pain 10/19/2016   Hip fracture ([place]) 10/31/2016   Leg swelling 12/15/2016   Status post reverse total shoulder replacement, right 11/04/2017   Avascular necrosis of left shoulder due to adverse effect of steroid therapy ([place]) 01/20/2018   Dislocation of hip joint prosthesis ([place]) 01/08/2017   History of fracture of left hip 01/15/2017   Rotator cuff tendinitis, right 07/26/2017   Status post hip hemiarthroplasty 01/08/2017   Status post lumbar spinal fusion 01/15/2017   Strain of right hip 02/26/2017   Fatigue 07/07/2018   Status post reverse arthroplasty of shoulder, left 07/26/2018   Pain due to onychomycosis of toenails of both feet 12/05/2018   Hav (hallux abducto valgus), unspecified laterality 12/05/2018   Anxiety [id number]   Complicated grief [id number]   Compression fracture of L4 vertebra ([place]) 02/21/2019   At high risk for falls 03/16/2019   Pelvic mass in female 03/16/2019   Episodic mood disorder ([place]) 04/18/2019   Leg edema 05/02/2019   Closed fracture of acromial process of scapula 10/28/2018   H/O cardiac catheterization 02/24/2011   Collapsed vertebra, not elsewhere classified, lumbar region, initial encounter for fracture ([place]) 02/21/2019   History of syncope 08/25/2016   Hypokalemia 08/25/2016   Other shoulder lesions, left shoulder 12/20/2017   Other specified postprocedural states 07/20/2013   Iliopsoas bursitis of right hip 08/29/2019   Overweight (BMI 25.0-29.9) 08/29/2019    NSIP (nonspecific interstitial pneumonitis) ([place]) 12/08/2019   Acromial process of scapula fracture 12/21/2019   Frequent falls 12/18/2020   Hip pain 12/18/2020   Acute pain of left knee 12/18/2020   Elevated brain natriuretic peptide (BNP) level 12/18/2020   Weakness of both lower extremities 12/18/2020   Assistance needed for mobility 12/18/2020   Pleural effusion on left 12/18/2020   Respiratory failure with hypoxia ([place]) 01/12/2021   Leg pain, bilateral    Physical deconditioning 02/27/2021   Acute on chronic combined systolic and diastolic CHF (congestive heart failure) ([place]) 05/26/2021   Left lower lobe pneumonia 05/26/2021   Elevated d-dimer 05/26/2021   Acute kidney injury superimposed on chronic kidney disease ([place]) 05/26/2021   Hyperkalemia 05/26/2021   Anxiety and depression 05/26/2021   Thrombocytosis 06/24/2021   Leukopenia 06/24/2021   Abnormal thyroid function test 06/24/2021   Lymphocytosis 07/13/2021   Community acquired pneumonia 07/22/2021   Sepsis due to pneumonia ([place]) 07/22/2021   Stage 3a chronic kidney disease (CKD) ([place]) 07/23/2021   Recurrent pneumonia 07/23/2021   Acute on chronic combined systolic (congestive) and diastolic (congestive) heart failure ([place]) 07/28/2021   COPD with exacerbation ([place]) 08/20/2021   Recurrent falls 09/01/2021   Closed nondisplaced fracture of proximal phalanx of lesser toe of right foot    Toe fracture, right 09/09/2021   Acute on chronic respiratory failure with hypoxia ([place]) 09/10/2021   Chronic respiratory failure with hypoxia ([place]) 10/01/2021   Anemia in stage 3a chronic kidney disease ([place]) 10/09/2021   Repeated falls 12/18/2020   Past Medical History:  Diagnosis Date   Aortic stenosis 07/09/2015   Arrhythmia  Asthma    Bipolar disorder ([place])    C. difficile diarrhea 2010   Carotid atherosclerosis, bilateral    Cataract    CHF (congestive heart failure) ([place])    Chicken pox    Chronic left shoulder pain  08/29/2019   CKD (chronic kidney disease), stage III (HCC)    COPD (chronic obstructive pulmonary disease) (HCC)    Depression    GERD (gastroesophageal reflux disease)    Heart murmur    ILD (interstitial lung disease) (HCC)    Inflammatory arthritis    Non-Obstructive CAD    Osteoarthritis    PUD (peptic ulcer disease)    S/P lumbar fusion 05/23/2021   S/P right hip fracture    Shoulder pain    Sleep apnea    Spinal stenosis at L4-L5 level    Toxic maculopathy    Constitutional Exam  General appearance: alert, cooperative, and in mild distress Vitals:   03/19/22 1302  BP: 108/62  Pulse: 90  Temp: 98.1 F (36.7 C)  SpO2: 99%  Weight: 175 lb (79.4 kg)  Height: 4' 11"$  (1.499 m)   BMI Assessment: Estimated body mass index is 35.35 kg/m as calculated from the following:   Height as of this encounter: 4' 11"$  (1.499 m).   Weight as of this encounter: 175 lb (79.4 kg).  BMI interpretation table: BMI level Category Range association with higher incidence of chronic pain  <18 kg/m2 Underweight   18.5-24.9 kg/m2 Ideal body weight   25-29.9 kg/m2 Overweight Increased incidence by 20%  30-34.9 kg/m2 Obese (Class I) Increased incidence by 68%  35-39.9 kg/m2 Severe obesity (Class II) Increased incidence by 136%  >40 kg/m2 Extreme obesity (Class III) Increased incidence by 254%   Patient's current BMI Ideal Body weight  Body mass index is 35.35 kg/m. Patient must be at least 60 in tall to calculate ideal body weight   BMI Readings from Last 4 Encounters:  03/19/22 35.35 kg/m  03/12/22 34.75 kg/m  02/26/22 34.83 kg/m  01/06/22 32.53 kg/m   Wt Readings from Last 4 Encounters:  03/19/22 175 lb (79.4 kg)  03/12/22 175 lb (79.4 kg)  02/26/22 175 lb 6.4 oz (79.6 kg)  01/06/22 175 lb (79.4 kg)    Psych/Mental status: Alert, oriented x 3 (person, place, & time)       Eyes: PERLA Respiratory: Oxygen-dependent COPD  Cervical Spine Area Exam  Skin & Axial Inspection: No  masses, redness, edema, swelling, or associated skin lesions Alignment: Symmetrical Functional ROM: Pain restricted ROM      Stability: No instability detected Muscle Tone/Strength: Functionally intact. No obvious neuro-muscular anomalies detected. Sensory (Neurological): Musculoskeletal pain pattern Palpation: No palpable anomalies             Upper Extremity (UE) Exam    Side: Right upper extremity  Side: Left upper extremity  Skin & Extremity Inspection: Evidence of prior arthroplastic surgery  Skin & Extremity Inspection: Evidence of prior arthroplastic surgery  Functional ROM: Decreased ROM for shoulder  Functional ROM: Decreased ROM for shoulder  Muscle Tone/Strength: Functionally intact. No obvious neuro-muscular anomalies detected.  Muscle Tone/Strength: Functionally intact. No obvious neuro-muscular anomalies detected.  Sensory (Neurological): Arthropathic arthralgia          Sensory (Neurological): Arthropathic arthralgia          Palpation: No palpable anomalies              Palpation: No palpable anomalies  Provocative Test(s):  Phalen's test: deferred Tinel's test: deferred Apley's scratch test (touch opposite shoulder):  Action 1 (Across chest): Decreased ROM Action 2 (Overhead): Decreased ROM Action 3 (LB reach): Decreased ROM   Provocative Test(s):  Phalen's test: deferred Tinel's test: deferred Apley's scratch test (touch opposite shoulder):  Action 1 (Across chest): Decreased ROM Action 2 (Overhead): Decreased ROM Action 3 (LB reach): Decreased ROM    Lumbar Spine Area Exam  Skin & Axial Inspection: Well healed scar from previous spine surgery detected Alignment: Scoliosis detected Functional ROM: Pain restricted ROM       Stability: No instability detected Muscle Tone/Strength: Functionally intact. No obvious neuro-muscular anomalies detected. Sensory (Neurological): Neurogenic pain pattern Palpation: No palpable anomalies        Lower Extremity  Exam    Side: Right lower extremity  Side: Left lower extremity  Stability: No instability observed          Stability: No instability observed          Skin & Extremity Inspection: Skin color, temperature, and hair growth are WNL. No peripheral edema or cyanosis. No masses, redness, swelling, asymmetry, or associated skin lesions. No contractures.  Skin & Extremity Inspection: Skin color, temperature, and hair growth are WNL. No peripheral edema or cyanosis. No masses, redness, swelling, asymmetry, or associated skin lesions. No contractures.  Functional ROM: Pain restricted ROM for all joints of the lower extremity          Functional ROM: Pain restricted ROM for all joints of the lower extremity          Muscle Tone/Strength: Functionally intact. No obvious neuro-muscular anomalies detected.  Muscle Tone/Strength: Functionally intact. No obvious neuro-muscular anomalies detected.  Sensory (Neurological): Arthropathic arthralgia        Sensory (Neurological): Arthropathic arthralgia        DTR: Patellar: deferred today Achilles: deferred today Plantar: deferred today  DTR: Patellar: deferred today Achilles: deferred today Plantar: deferred today  Palpation: No palpable anomalies  Palpation: No palpable anomalies    Assessment  Primary Diagnosis & Pertinent Problem List: The primary encounter diagnosis was Chronic pain syndrome. Diagnoses of Chronic pain of both shoulders, History of lumbar fusion, Drug-induced osteoporosis, History of major orthopedic surgery, Lumbar radiculopathy, Primary osteoarthritis of both knees, PAD (peripheral artery disease) (HCC), Rheumatoid arthritis of wrist, unspecified laterality, unspecified whether rheumatoid factor present ([place]), Chronic obstructive pulmonary disease, unspecified COPD type ([place]), OSA (obstructive sleep apnea), and Stage 3b chronic kidney disease ([place]) were also pertinent to this visit.  Visit Diagnosis (New problems to examiner): 1. Chronic  pain syndrome   2. Chronic pain of both shoulders   3. History of lumbar fusion   4. Drug-induced osteoporosis   5. History of major orthopedic surgery   6. Lumbar radiculopathy   7. Primary osteoarthritis of both knees   8. PAD (peripheral artery disease) ([place])   9. Rheumatoid arthritis of wrist, unspecified laterality, unspecified whether rheumatoid factor present ([place])   10. Chronic obstructive pulmonary disease, unspecified COPD type ([place])   11. OSA (obstructive sleep apnea)   12. Stage 3b chronic kidney disease ([place])    Plan of Care (Initial workup plan)  Note: Ms. Cree was reminded that as per protocol, today's visit has been an evaluation only. We have not taken over the patient's controlled substance management.   Lab Orders         Compliance Drug Analysis, Ur       Analgesic Pharmacotherapy:  Opioid Analgesics: Candidate  for Tramadol 50 mg BID prn pending UDS  Membrane stabilizer: Adequate regimen continue with PCP  Muscle relaxant:  avoid  NSAID: Medically contraindicated  Other analgesic(s): To be determined at a later time   Interventional management options: Ms. Barger was informed that there is no guarantee that she would be a candidate for interventional therapies. The decision will be based on the results of diagnostic studies, as well as Ms. Vitale's risk profile.  Procedure(s) under consideration:  Left suprascapular nerve block Left axillary nerve block   Provider-requested follow-up: No follow-ups on file.  Future Appointments  Date Time Provider [place]  03/30/2022 11:00 AM AR-ECHO 1 [facility] None  03/31/2022  2:45 PM Jonathon Bellows, MD [facility] None  04/02/2022 10:00 AM Gillis Santa, MD [facility] None  04/22/2022  1:00 PM Alisa Graff, FNP [facility] None  04/23/2022  1:30 PM Meade Maw, MD [facility] None  05/21/2022  1:40 PM Carollee Leitz, MD [facility]  05/28/2022  2:00 PM [name], MD [facility] None  07/17/2022 11:00 AM  [facility] None  07/21/2022  1:15 PM Earlie Server, MD [facility] None  07/21/2022  1:30 PM [facility] INFUSION CHAIR 1 [facility] None  01/04/2023 11:00 AM Vaillancourt, Samantha, PA-C [facility] None    Duration of encounter: 55mnutes.  Total time on encounter, as per AMA guidelines included both the face-to-face and non-face-to-face time personally spent by the physician and/or other qualified health care professional(s) on the day of the encounter (includes time in activities that require the physician or other qualified health care professional and does not include time in activities normally performed by clinical staff). Physician's time may include the following activities when performed: Preparing to see the patient (e.g., pre-charting review of records, searching for previously ordered imaging, lab work, and nerve conduction tests) Review of prior analgesic pharmacotherapies. Reviewing PMP Interpreting ordered tests (e.g., lab work, imaging, nerve conduction tests) Performing post-procedure evaluations, including interpretation of diagnostic procedures Obtaining and/or reviewing separately obtained history Performing a medically appropriate examination and/or evaluation Counseling and educating the patient/family/caregiver Ordering medications, tests, or procedures Referring and communicating with other health care professionals (when not separately reported) Documenting clinical information in the electronic or other health record Independently interpreting results (not separately reported) and communicating results to the patient/ family/caregiver Care coordination (not separately reported)  Note by: BGillis Santa MD (TTS technology used. I apologize for any typographical errors that were not detected and corrected.) Date: 03/19/2022; Time: 2:06 PM

## 2022-03-25 LAB — COMPLIANCE DRUG ANALYSIS, UR

## 2022-03-28 ENCOUNTER — Other Ambulatory Visit: Payer: Self-pay

## 2022-03-28 ENCOUNTER — Inpatient Hospital Stay
Admission: EM | Admit: 2022-03-28 | Discharge: 2022-04-08 | DRG: 871 | Disposition: A | Discharge status: Skilled Nursing Facility | Payer: Medicare PPO | Attending: Internal Medicine | Admitting: Internal Medicine

## 2022-03-28 ENCOUNTER — Emergency Department: Payer: Medicare PPO

## 2022-03-28 DIAGNOSIS — Z1152 Encounter for screening for COVID-19: Secondary | ICD-10-CM | POA: Diagnosis not present

## 2022-03-28 DIAGNOSIS — I5A Non-ischemic myocardial injury (non-traumatic): Secondary | ICD-10-CM | POA: Diagnosis present

## 2022-03-28 DIAGNOSIS — I2510 Atherosclerotic heart disease of native coronary artery without angina pectoris: Secondary | ICD-10-CM | POA: Diagnosis present

## 2022-03-28 DIAGNOSIS — E669 Obesity, unspecified: Secondary | ICD-10-CM | POA: Diagnosis present

## 2022-03-28 DIAGNOSIS — J9622 Acute and chronic respiratory failure with hypercapnia: Secondary | ICD-10-CM | POA: Diagnosis present

## 2022-03-28 DIAGNOSIS — Z9981 Dependence on supplemental oxygen: Secondary | ICD-10-CM

## 2022-03-28 DIAGNOSIS — D500 Iron deficiency anemia secondary to blood loss (chronic): Secondary | ICD-10-CM | POA: Diagnosis present

## 2022-03-28 DIAGNOSIS — Z888 Allergy status to other drugs, medicaments and biological substances status: Secondary | ICD-10-CM

## 2022-03-28 DIAGNOSIS — E8729 Other acidosis: Secondary | ICD-10-CM | POA: Diagnosis not present

## 2022-03-28 DIAGNOSIS — Z818 Family history of other mental and behavioral disorders: Secondary | ICD-10-CM

## 2022-03-28 DIAGNOSIS — I509 Heart failure, unspecified: Secondary | ICD-10-CM | POA: Diagnosis not present

## 2022-03-28 DIAGNOSIS — A419 Sepsis, unspecified organism: Secondary | ICD-10-CM | POA: Diagnosis not present

## 2022-03-28 DIAGNOSIS — Z885 Allergy status to narcotic agent status: Secondary | ICD-10-CM

## 2022-03-28 DIAGNOSIS — Z807 Family history of other malignant neoplasms of lymphoid, hematopoietic and related tissues: Secondary | ICD-10-CM

## 2022-03-28 DIAGNOSIS — Z91048 Other nonmedicinal substance allergy status: Secondary | ICD-10-CM

## 2022-03-28 DIAGNOSIS — N1832 Chronic kidney disease, stage 3b: Secondary | ICD-10-CM | POA: Diagnosis present

## 2022-03-28 DIAGNOSIS — I130 Hypertensive heart and chronic kidney disease with heart failure and stage 1 through stage 4 chronic kidney disease, or unspecified chronic kidney disease: Secondary | ICD-10-CM | POA: Diagnosis present

## 2022-03-28 DIAGNOSIS — F3176 Bipolar disorder, in full remission, most recent episode depressed: Secondary | ICD-10-CM | POA: Diagnosis present

## 2022-03-28 DIAGNOSIS — Z91041 Radiographic dye allergy status: Secondary | ICD-10-CM

## 2022-03-28 DIAGNOSIS — J440 Chronic obstructive pulmonary disease with acute lower respiratory infection: Secondary | ICD-10-CM | POA: Diagnosis present

## 2022-03-28 DIAGNOSIS — Z8261 Family history of arthritis: Secondary | ICD-10-CM

## 2022-03-28 DIAGNOSIS — Z96651 Presence of right artificial knee joint: Secondary | ICD-10-CM | POA: Diagnosis present

## 2022-03-28 DIAGNOSIS — Z887 Allergy status to serum and vaccine status: Secondary | ICD-10-CM

## 2022-03-28 DIAGNOSIS — G629 Polyneuropathy, unspecified: Secondary | ICD-10-CM

## 2022-03-28 DIAGNOSIS — Z8249 Family history of ischemic heart disease and other diseases of the circulatory system: Secondary | ICD-10-CM

## 2022-03-28 DIAGNOSIS — R339 Retention of urine, unspecified: Secondary | ICD-10-CM | POA: Diagnosis present

## 2022-03-28 DIAGNOSIS — I472 Ventricular tachycardia, unspecified: Secondary | ICD-10-CM | POA: Diagnosis not present

## 2022-03-28 DIAGNOSIS — Z515 Encounter for palliative care: Secondary | ICD-10-CM

## 2022-03-28 DIAGNOSIS — G4733 Obstructive sleep apnea (adult) (pediatric): Secondary | ICD-10-CM | POA: Diagnosis present

## 2022-03-28 DIAGNOSIS — I4729 Other ventricular tachycardia: Secondary | ICD-10-CM | POA: Diagnosis not present

## 2022-03-28 DIAGNOSIS — I080 Rheumatic disorders of both mitral and aortic valves: Secondary | ICD-10-CM | POA: Diagnosis present

## 2022-03-28 DIAGNOSIS — J9621 Acute and chronic respiratory failure with hypoxia: Secondary | ICD-10-CM | POA: Diagnosis present

## 2022-03-28 DIAGNOSIS — I340 Nonrheumatic mitral (valve) insufficiency: Secondary | ICD-10-CM | POA: Diagnosis not present

## 2022-03-28 DIAGNOSIS — J849 Interstitial pulmonary disease, unspecified: Secondary | ICD-10-CM | POA: Diagnosis present

## 2022-03-28 DIAGNOSIS — I701 Atherosclerosis of renal artery: Secondary | ICD-10-CM | POA: Diagnosis present

## 2022-03-28 DIAGNOSIS — M069 Rheumatoid arthritis, unspecified: Secondary | ICD-10-CM | POA: Diagnosis present

## 2022-03-28 DIAGNOSIS — R338 Other retention of urine: Secondary | ICD-10-CM | POA: Diagnosis not present

## 2022-03-28 DIAGNOSIS — Z8711 Personal history of peptic ulcer disease: Secondary | ICD-10-CM

## 2022-03-28 DIAGNOSIS — G9341 Metabolic encephalopathy: Secondary | ICD-10-CM | POA: Diagnosis not present

## 2022-03-28 DIAGNOSIS — J189 Pneumonia, unspecified organism: Secondary | ICD-10-CM | POA: Diagnosis present

## 2022-03-28 DIAGNOSIS — Z87891 Personal history of nicotine dependence: Secondary | ICD-10-CM

## 2022-03-28 DIAGNOSIS — Z882 Allergy status to sulfonamides status: Secondary | ICD-10-CM

## 2022-03-28 DIAGNOSIS — I38 Endocarditis, valve unspecified: Secondary | ICD-10-CM | POA: Diagnosis present

## 2022-03-28 DIAGNOSIS — Z905 Acquired absence of kidney: Secondary | ICD-10-CM

## 2022-03-28 DIAGNOSIS — Z881 Allergy status to other antibiotic agents status: Secondary | ICD-10-CM

## 2022-03-28 DIAGNOSIS — I5033 Acute on chronic diastolic (congestive) heart failure: Secondary | ICD-10-CM | POA: Diagnosis not present

## 2022-03-28 DIAGNOSIS — G8929 Other chronic pain: Secondary | ICD-10-CM | POA: Diagnosis present

## 2022-03-28 DIAGNOSIS — Z981 Arthrodesis status: Secondary | ICD-10-CM

## 2022-03-28 DIAGNOSIS — I480 Paroxysmal atrial fibrillation: Secondary | ICD-10-CM | POA: Diagnosis present

## 2022-03-28 DIAGNOSIS — Z91198 Patient's noncompliance with other medical treatment and regimen for other reason: Secondary | ICD-10-CM

## 2022-03-28 DIAGNOSIS — Z801 Family history of malignant neoplasm of trachea, bronchus and lung: Secondary | ICD-10-CM

## 2022-03-28 DIAGNOSIS — J441 Chronic obstructive pulmonary disease with (acute) exacerbation: Secondary | ICD-10-CM | POA: Diagnosis not present

## 2022-03-28 DIAGNOSIS — I10 Essential (primary) hypertension: Secondary | ICD-10-CM | POA: Diagnosis present

## 2022-03-28 DIAGNOSIS — Z96643 Presence of artificial hip joint, bilateral: Secondary | ICD-10-CM | POA: Diagnosis present

## 2022-03-28 DIAGNOSIS — Z8701 Personal history of pneumonia (recurrent): Secondary | ICD-10-CM

## 2022-03-28 DIAGNOSIS — Z7952 Long term (current) use of systemic steroids: Secondary | ICD-10-CM

## 2022-03-28 DIAGNOSIS — Z803 Family history of malignant neoplasm of breast: Secondary | ICD-10-CM

## 2022-03-28 DIAGNOSIS — Z800 Family history of malignant neoplasm of digestive organs: Secondary | ICD-10-CM

## 2022-03-28 DIAGNOSIS — Z96611 Presence of right artificial shoulder joint: Secondary | ICD-10-CM | POA: Diagnosis present

## 2022-03-28 DIAGNOSIS — R6520 Severe sepsis without septic shock: Secondary | ICD-10-CM | POA: Diagnosis present

## 2022-03-28 DIAGNOSIS — Z823 Family history of stroke: Secondary | ICD-10-CM

## 2022-03-28 DIAGNOSIS — Z886 Allergy status to analgesic agent status: Secondary | ICD-10-CM

## 2022-03-28 DIAGNOSIS — D631 Anemia in chronic kidney disease: Secondary | ICD-10-CM | POA: Diagnosis present

## 2022-03-28 DIAGNOSIS — I739 Peripheral vascular disease, unspecified: Secondary | ICD-10-CM | POA: Diagnosis present

## 2022-03-28 DIAGNOSIS — K219 Gastro-esophageal reflux disease without esophagitis: Secondary | ICD-10-CM | POA: Diagnosis present

## 2022-03-28 DIAGNOSIS — I083 Combined rheumatic disorders of mitral, aortic and tricuspid valves: Secondary | ICD-10-CM | POA: Diagnosis present

## 2022-03-28 DIAGNOSIS — M5416 Radiculopathy, lumbar region: Secondary | ICD-10-CM | POA: Diagnosis present

## 2022-03-28 DIAGNOSIS — I5032 Chronic diastolic (congestive) heart failure: Secondary | ICD-10-CM | POA: Diagnosis not present

## 2022-03-28 DIAGNOSIS — Z7951 Long term (current) use of inhaled steroids: Secondary | ICD-10-CM

## 2022-03-28 DIAGNOSIS — Z7901 Long term (current) use of anticoagulants: Secondary | ICD-10-CM

## 2022-03-28 DIAGNOSIS — R531 Weakness: Secondary | ICD-10-CM

## 2022-03-28 DIAGNOSIS — I872 Venous insufficiency (chronic) (peripheral): Secondary | ICD-10-CM | POA: Diagnosis present

## 2022-03-28 DIAGNOSIS — K449 Diaphragmatic hernia without obstruction or gangrene: Secondary | ICD-10-CM | POA: Diagnosis present

## 2022-03-28 DIAGNOSIS — R32 Unspecified urinary incontinence: Secondary | ICD-10-CM | POA: Diagnosis present

## 2022-03-28 DIAGNOSIS — J449 Chronic obstructive pulmonary disease, unspecified: Secondary | ICD-10-CM | POA: Diagnosis not present

## 2022-03-28 DIAGNOSIS — Z7189 Other specified counseling: Secondary | ICD-10-CM | POA: Diagnosis not present

## 2022-03-28 DIAGNOSIS — Z9181 History of falling: Secondary | ICD-10-CM

## 2022-03-28 DIAGNOSIS — I2489 Other forms of acute ischemic heart disease: Secondary | ICD-10-CM | POA: Diagnosis present

## 2022-03-28 DIAGNOSIS — Z66 Do not resuscitate: Secondary | ICD-10-CM | POA: Diagnosis present

## 2022-03-28 DIAGNOSIS — E785 Hyperlipidemia, unspecified: Secondary | ICD-10-CM | POA: Diagnosis present

## 2022-03-28 DIAGNOSIS — I5043 Acute on chronic combined systolic (congestive) and diastolic (congestive) heart failure: Secondary | ICD-10-CM | POA: Diagnosis present

## 2022-03-28 DIAGNOSIS — Z79899 Other long term (current) drug therapy: Secondary | ICD-10-CM

## 2022-03-28 DIAGNOSIS — Z825 Family history of asthma and other chronic lower respiratory diseases: Secondary | ICD-10-CM

## 2022-03-28 DIAGNOSIS — Z6837 Body mass index (BMI) 37.0-37.9, adult: Secondary | ICD-10-CM

## 2022-03-28 LAB — CBC WITH DIFFERENTIAL/PLATELET
Abs Immature Granulocytes: 0.46 10*3/uL — ABNORMAL HIGH (ref 0.00–0.07)
Basophils Absolute: 0.1 10*3/uL (ref 0.0–0.1)
Basophils Relative: 1 %
Eosinophils Absolute: 0.7 10*3/uL — ABNORMAL HIGH (ref 0.0–0.5)
Eosinophils Relative: 4 %
HCT: 34.2 % — ABNORMAL LOW (ref 36.0–46.0)
Hemoglobin: 10.5 g/dL — ABNORMAL LOW (ref 12.0–15.0)
Immature Granulocytes: 3 %
Lymphocytes Relative: 15 %
Lymphs Abs: 2.6 10*3/uL (ref 0.7–4.0)
MCH: 27.7 pg (ref 26.0–34.0)
MCHC: 30.7 g/dL (ref 30.0–36.0)
MCV: 90.2 fL (ref 80.0–100.0)
Monocytes Absolute: 0.9 10*3/uL (ref 0.1–1.0)
Monocytes Relative: 5 %
Neutro Abs: 12.6 10*3/uL — ABNORMAL HIGH (ref 1.7–7.7)
Neutrophils Relative %: 72 %
Platelets: 397 10*3/uL (ref 150–400)
RBC: 3.79 MIL/uL — ABNORMAL LOW (ref 3.87–5.11)
RDW: 15.1 % (ref 11.5–15.5)
WBC: 17.4 10*3/uL — ABNORMAL HIGH (ref 4.0–10.5)
nRBC: 0.1 % (ref 0.0–0.2)

## 2022-03-28 LAB — CBC
HCT: 31.2 % — ABNORMAL LOW (ref 36.0–46.0)
Hemoglobin: 9.8 g/dL — ABNORMAL LOW (ref 12.0–15.0)
MCH: 27.8 pg (ref 26.0–34.0)
MCHC: 31.4 g/dL (ref 30.0–36.0)
MCV: 88.4 fL (ref 80.0–100.0)
Platelets: 317 10*3/uL (ref 150–400)
RBC: 3.53 MIL/uL — ABNORMAL LOW (ref 3.87–5.11)
RDW: 15.1 % (ref 11.5–15.5)
WBC: 17.2 10*3/uL — ABNORMAL HIGH (ref 4.0–10.5)
nRBC: 0 % (ref 0.0–0.2)

## 2022-03-28 LAB — TROPONIN I (HIGH SENSITIVITY)
Troponin I (High Sensitivity): 38 ng/L — ABNORMAL HIGH (ref <18)
Troponin I (High Sensitivity): 122 ng/L (ref <18)
Troponin I (High Sensitivity): 56 ng/L — ABNORMAL HIGH (ref <18)

## 2022-03-28 LAB — RESP PANEL BY RT-PCR (RSV, FLU A&B, COVID)  RVPGX2
Influenza A by PCR: NEGATIVE
Influenza B by PCR: NEGATIVE
Resp Syncytial Virus by PCR: NEGATIVE
SARS Coronavirus 2 by RT PCR: NEGATIVE

## 2022-03-28 LAB — COMPREHENSIVE METABOLIC PANEL
ALT: 16 U/L (ref 0–44)
AST: 27 U/L (ref 15–41)
Albumin: 3.1 g/dL — ABNORMAL LOW (ref 3.5–5.0)
Alkaline Phosphatase: 122 U/L (ref 38–126)
Anion gap: 12 (ref 5–15)
BUN: 26 mg/dL — ABNORMAL HIGH (ref 8–23)
CO2: 25 mmol/L (ref 22–32)
Calcium: 8.4 mg/dL — ABNORMAL LOW (ref 8.9–10.3)
Chloride: 100 mmol/L (ref 98–111)
Creatinine, Ser: 1.44 mg/dL — ABNORMAL HIGH (ref 0.44–1.00)
GFR, Estimated: 37 mL/min — ABNORMAL LOW (ref >60)
Glucose, Bld: 151 mg/dL — ABNORMAL HIGH (ref 70–99)
Potassium: 3.7 mmol/L (ref 3.5–5.1)
Sodium: 137 mmol/L (ref 135–145)
Total Bilirubin: 0.6 mg/dL (ref 0.3–1.2)
Total Protein: 7.4 g/dL (ref 6.5–8.1)

## 2022-03-28 LAB — BASIC METABOLIC PANEL
Anion gap: 12 (ref 5–15)
BUN: 25 mg/dL — ABNORMAL HIGH (ref 8–23)
CO2: 26 mmol/L (ref 22–32)
Calcium: 8.1 mg/dL — ABNORMAL LOW (ref 8.9–10.3)
Chloride: 101 mmol/L (ref 98–111)
Creatinine, Ser: 1.26 mg/dL — ABNORMAL HIGH (ref 0.44–1.00)
GFR, Estimated: 43 mL/min — ABNORMAL LOW (ref >60)
Glucose, Bld: 175 mg/dL — ABNORMAL HIGH (ref 70–99)
Potassium: 4 mmol/L (ref 3.5–5.1)
Sodium: 139 mmol/L (ref 135–145)

## 2022-03-28 LAB — PROCALCITONIN
Procalcitonin: <0.1 ng/mL
Procalcitonin: 0.76 ng/mL

## 2022-03-28 LAB — CORTISOL-AM, BLOOD: Cortisol - AM: 8.3 ug/dL (ref 6.7–22.6)

## 2022-03-28 LAB — PROTIME-INR
INR: 1.3 — ABNORMAL HIGH (ref 0.8–1.2)
Prothrombin Time: 16 seconds — ABNORMAL HIGH (ref 11.4–15.2)

## 2022-03-28 LAB — BLOOD GAS, VENOUS
Acid-Base Excess: 4.5 mmol/L — ABNORMAL HIGH (ref 0.0–2.0)
Bicarbonate: 29.8 mmol/L — ABNORMAL HIGH (ref 20.0–28.0)
O2 Saturation: 83.4 %
Patient temperature: 37
pCO2, Ven: 46 mmHg (ref 44–60)
pH, Ven: 7.42 (ref 7.25–7.43)
pO2, Ven: 51 mmHg — ABNORMAL HIGH (ref 32–45)

## 2022-03-28 LAB — BRAIN NATRIURETIC PEPTIDE: B Natriuretic Peptide: 450.9 pg/mL — ABNORMAL HIGH (ref 0.0–100.0)

## 2022-03-28 LAB — LACTIC ACID, PLASMA
Lactic Acid, Venous: 2.2 mmol/L (ref 0.5–1.9)
Lactic Acid, Venous: 1.4 mmol/L (ref 0.5–1.9)

## 2022-03-28 MED ORDER — LORATADINE 10 MG PO TABS
10.0000 mg | ORAL_TABLET | Freq: Every day | ORAL | Status: DC
  Administered 2022-03-28 – 2022-04-08 (×12): 10 mg via ORAL
  Filled 2022-03-28 (×12): qty 1

## 2022-03-28 MED ORDER — FLUTICASONE PROPIONATE 50 MCG/ACT NA SUSP
2.0000 | Freq: Every day | NASAL | Status: DC
  Filled 2022-03-28: qty 16

## 2022-03-28 MED ORDER — TRAZODONE HCL 50 MG PO TABS
25.0000 mg | ORAL_TABLET | Freq: Every evening | ORAL | Status: DC | PRN
  Administered 2022-03-30 – 2022-04-07 (×8): 25 mg via ORAL
  Filled 2022-03-28 (×9): qty 1

## 2022-03-28 MED ORDER — IPRATROPIUM-ALBUTEROL 0.5-2.5 (3) MG/3ML IN SOLN
3.0000 mL | Freq: Three times a day (TID) | RESPIRATORY_TRACT | Status: DC
  Administered 2022-03-28 – 2022-03-30 (×6): 3 mL via RESPIRATORY_TRACT
  Filled 2022-03-28 (×6): qty 3

## 2022-03-28 MED ORDER — SODIUM CHLORIDE 0.9 % IV SOLN
3.0000 g | Freq: Four times a day (QID) | INTRAVENOUS | Status: DC
  Administered 2022-03-28 – 2022-04-03 (×25): 3 g via INTRAVENOUS
  Filled 2022-03-28 (×2): qty 3
  Filled 2022-03-28: qty 8
  Filled 2022-03-28: qty 3
  Filled 2022-03-28: qty 8
  Filled 2022-03-28 (×2): qty 3
  Filled 2022-03-28: qty 8
  Filled 2022-03-28: qty 3
  Filled 2022-03-28: qty 8
  Filled 2022-03-28 (×2): qty 3
  Filled 2022-03-28 (×2): qty 8
  Filled 2022-03-28: qty 3
  Filled 2022-03-28: qty 8
  Filled 2022-03-28: qty 3
  Filled 2022-03-28 (×5): qty 8
  Filled 2022-03-28: qty 3
  Filled 2022-03-28 (×2): qty 8
  Filled 2022-03-28 (×2): qty 3

## 2022-03-28 MED ORDER — IPRATROPIUM-ALBUTEROL 0.5-2.5 (3) MG/3ML IN SOLN
9.0000 mL | Freq: Once | RESPIRATORY_TRACT | Status: AC
  Administered 2022-03-28: 9 mL via RESPIRATORY_TRACT
  Filled 2022-03-28: qty 3

## 2022-03-28 MED ORDER — QUETIAPINE FUMARATE 25 MG PO TABS
25.0000 mg | ORAL_TABLET | Freq: Every day | ORAL | Status: DC
  Administered 2022-03-28 – 2022-04-07 (×11): 25 mg via ORAL
  Filled 2022-03-28 (×11): qty 1

## 2022-03-28 MED ORDER — SODIUM CHLORIDE 0.9 % IV SOLN
500.0000 mg | INTRAVENOUS | Status: AC
  Administered 2022-03-29 – 2022-04-01 (×4): 500 mg via INTRAVENOUS
  Filled 2022-03-28 (×4): qty 500

## 2022-03-28 MED ORDER — FLUTICASONE PROPIONATE 50 MCG/ACT NA SUSP: 2.0000 | Freq: Every day | NASAL | Status: DC | PRN

## 2022-03-28 MED ORDER — NEBIVOLOL HCL 5 MG PO TABS
5.0000 mg | ORAL_TABLET | Freq: Two times a day (BID) | ORAL | Status: DC
  Administered 2022-03-28 – 2022-04-08 (×23): 5 mg via ORAL
  Filled 2022-03-28 (×25): qty 1

## 2022-03-28 MED ORDER — ALBUTEROL SULFATE (2.5 MG/3ML) 0.083% IN NEBU
2.5000 mg | INHALATION_SOLUTION | RESPIRATORY_TRACT | Status: DC | PRN
  Administered 2022-03-30 – 2022-03-31 (×2): 2.5 mg via RESPIRATORY_TRACT
  Filled 2022-03-28 (×2): qty 3

## 2022-03-28 MED ORDER — GABAPENTIN 300 MG PO CAPS: 300.0000 mg | ORAL_CAPSULE | ORAL | Status: DC

## 2022-03-28 MED ORDER — VITAMIN D 25 MCG (1000 UNIT) PO TABS
4000.0000 [IU] | ORAL_TABLET | Freq: Every day | ORAL | Status: DC
  Administered 2022-03-28 – 2022-04-08 (×12): 4000 [IU] via ORAL
  Filled 2022-03-28 (×12): qty 4

## 2022-03-28 MED ORDER — PREDNISONE 10 MG PO TABS: 5.0000 mg | ORAL_TABLET | Freq: Every day | ORAL | Status: DC

## 2022-03-28 MED ORDER — DICYCLOMINE HCL 10 MG PO CAPS
10.0000 mg | ORAL_CAPSULE | Freq: Two times a day (BID) | ORAL | Status: DC | PRN
  Administered 2022-03-28 – 2022-04-07 (×11): 10 mg via ORAL
  Filled 2022-03-28 (×13): qty 1

## 2022-03-28 MED ORDER — FAMOTIDINE 20 MG PO TABS
20.0000 mg | ORAL_TABLET | Freq: Every day | ORAL | Status: DC
  Administered 2022-03-28 – 2022-04-08 (×12): 20 mg via ORAL
  Filled 2022-03-28 (×12): qty 1

## 2022-03-28 MED ORDER — BUSPIRONE HCL 10 MG PO TABS
5.0000 mg | ORAL_TABLET | Freq: Two times a day (BID) | ORAL | Status: DC
  Administered 2022-03-28 – 2022-04-08 (×23): 5 mg via ORAL
  Filled 2022-03-28 (×23): qty 1

## 2022-03-28 MED ORDER — ESCITALOPRAM OXALATE 10 MG PO TABS
10.0000 mg | ORAL_TABLET | Freq: Every day | ORAL | Status: DC
  Administered 2022-03-28 – 2022-04-08 (×12): 10 mg via ORAL
  Filled 2022-03-28 (×12): qty 1

## 2022-03-28 MED ORDER — LEFLUNOMIDE 20 MG PO TABS
20.0000 mg | ORAL_TABLET | Freq: Every day | ORAL | Status: DC
  Administered 2022-03-28 – 2022-03-31 (×4): 20 mg via ORAL
  Filled 2022-03-28 (×5): qty 1

## 2022-03-28 MED ORDER — METHYLPREDNISOLONE SODIUM SUCC 40 MG IJ SOLR
40.0000 mg | Freq: Two times a day (BID) | INTRAMUSCULAR | Status: DC
  Administered 2022-03-28 – 2022-04-01 (×10): 40 mg via INTRAVENOUS
  Filled 2022-03-28 (×10): qty 1

## 2022-03-28 MED ORDER — SUCRALFATE 1 G PO TABS
1.0000 g | ORAL_TABLET | Freq: Two times a day (BID) | ORAL | Status: DC
  Administered 2022-03-28 – 2022-04-08 (×23): 1 g via ORAL
  Filled 2022-03-28 (×23): qty 1

## 2022-03-28 MED ORDER — LAMOTRIGINE 100 MG PO TABS
100.0000 mg | ORAL_TABLET | Freq: Two times a day (BID) | ORAL | Status: DC
  Administered 2022-03-28 – 2022-04-08 (×23): 100 mg via ORAL
  Filled 2022-03-28 (×2): qty 1
  Filled 2022-03-28: qty 4
  Filled 2022-03-28: qty 1
  Filled 2022-03-28 (×2): qty 4
  Filled 2022-03-28: qty 1
  Filled 2022-03-28: qty 4
  Filled 2022-03-28: qty 1
  Filled 2022-03-28 (×2): qty 4
  Filled 2022-03-28 (×2): qty 1
  Filled 2022-03-28 (×3): qty 4
  Filled 2022-03-28: qty 1
  Filled 2022-03-28: qty 4
  Filled 2022-03-28: qty 1
  Filled 2022-03-28: qty 4
  Filled 2022-03-28 (×3): qty 1

## 2022-03-28 MED ORDER — SODIUM CHLORIDE 0.9 % IV SOLN
500.0000 mg | Freq: Once | INTRAVENOUS | Status: AC
  Administered 2022-03-28: 500 mg via INTRAVENOUS
  Filled 2022-03-28: qty 5

## 2022-03-28 MED ORDER — GABAPENTIN 300 MG PO CAPS
600.0000 mg | ORAL_CAPSULE | Freq: Every day | ORAL | Status: DC
  Administered 2022-03-28 – 2022-04-07 (×11): 600 mg via ORAL
  Filled 2022-03-28 (×12): qty 2

## 2022-03-28 MED ORDER — APIXABAN 5 MG PO TABS
5.0000 mg | ORAL_TABLET | Freq: Two times a day (BID) | ORAL | Status: DC
  Administered 2022-03-28 – 2022-04-08 (×23): 5 mg via ORAL
  Filled 2022-03-28 (×23): qty 1

## 2022-03-28 MED ORDER — ONDANSETRON HCL 4 MG PO TABS
4.0000 mg | ORAL_TABLET | Freq: Four times a day (QID) | ORAL | Status: DC | PRN
  Administered 2022-04-06: 4 mg via ORAL
  Filled 2022-03-28: qty 1

## 2022-03-28 MED ORDER — PRAVASTATIN SODIUM 20 MG PO TABS
20.0000 mg | ORAL_TABLET | Freq: Every day | ORAL | Status: DC
  Administered 2022-03-28 – 2022-04-07 (×8): 20 mg via ORAL
  Filled 2022-03-28 (×9): qty 1

## 2022-03-28 MED ORDER — METHYLPREDNISOLONE SODIUM SUCC 125 MG IJ SOLR
125.0000 mg | Freq: Once | INTRAMUSCULAR | Status: AC
  Administered 2022-03-28: 125 mg via INTRAVENOUS
  Filled 2022-03-28: qty 2

## 2022-03-28 MED ORDER — IPRATROPIUM-ALBUTEROL 0.5-2.5 (3) MG/3ML IN SOLN
3.0000 mL | Freq: Four times a day (QID) | RESPIRATORY_TRACT | Status: DC
  Administered 2022-03-28: 3 mL via RESPIRATORY_TRACT
  Filled 2022-03-28: qty 3

## 2022-03-28 MED ORDER — PANTOPRAZOLE SODIUM 40 MG PO TBEC
40.0000 mg | DELAYED_RELEASE_TABLET | Freq: Two times a day (BID) | ORAL | Status: DC
  Administered 2022-03-28 – 2022-04-08 (×21): 40 mg via ORAL
  Filled 2022-03-28 (×23): qty 1

## 2022-03-28 MED ORDER — MIRABEGRON ER 25 MG PO TB24
25.0000 mg | ORAL_TABLET | Freq: Every day | ORAL | Status: DC
  Administered 2022-03-28 – 2022-04-08 (×11): 25 mg via ORAL
  Filled 2022-03-28 (×13): qty 1

## 2022-03-28 MED ORDER — GABAPENTIN 300 MG PO CAPS
300.0000 mg | ORAL_CAPSULE | Freq: Every day | ORAL | Status: DC
  Administered 2022-03-28 – 2022-04-08 (×12): 300 mg via ORAL
  Filled 2022-03-28 (×12): qty 1

## 2022-03-28 MED ORDER — ACETAMINOPHEN 650 MG RE SUPP: 650.0000 mg | Freq: Four times a day (QID) | RECTAL | Status: DC | PRN

## 2022-03-28 MED ORDER — MAGNESIUM HYDROXIDE 400 MG/5ML PO SUSP: 30.0000 mL | Freq: Every day | ORAL | Status: DC | PRN

## 2022-03-28 MED ORDER — AMLODIPINE BESYLATE 5 MG PO TABS
5.0000 mg | ORAL_TABLET | Freq: Every day | ORAL | Status: DC
  Administered 2022-03-28 – 2022-04-08 (×12): 5 mg via ORAL
  Filled 2022-03-28 (×12): qty 1

## 2022-03-28 MED ORDER — TORSEMIDE 20 MG PO TABS
20.0000 mg | ORAL_TABLET | Freq: Every day | ORAL | Status: DC
  Administered 2022-03-28 – 2022-03-29 (×2): 20 mg via ORAL
  Filled 2022-03-28 (×2): qty 1

## 2022-03-28 MED ORDER — SODIUM CHLORIDE 0.9 % IV SOLN
3.0000 g | Freq: Two times a day (BID) | INTRAVENOUS | Status: DC
  Filled 2022-03-28: qty 8

## 2022-03-28 MED ORDER — MONTELUKAST SODIUM 10 MG PO TABS
10.0000 mg | ORAL_TABLET | Freq: Every day | ORAL | Status: DC
  Administered 2022-03-28 – 2022-04-08 (×12): 10 mg via ORAL
  Filled 2022-03-28 (×13): qty 1

## 2022-03-28 MED ORDER — ACETAMINOPHEN 325 MG PO TABS
650.0000 mg | ORAL_TABLET | Freq: Four times a day (QID) | ORAL | Status: DC | PRN
  Administered 2022-03-28 – 2022-04-08 (×16): 650 mg via ORAL
  Filled 2022-03-28 (×18): qty 2

## 2022-03-28 MED ORDER — ONDANSETRON HCL 4 MG/2ML IJ SOLN: 4.0000 mg | Freq: Four times a day (QID) | INTRAMUSCULAR | Status: DC | PRN

## 2022-03-28 MED ORDER — LIDOCAINE 5 % EX PTCH
1.0000 | MEDICATED_PATCH | Freq: Every day | CUTANEOUS | Status: DC
  Administered 2022-03-30 – 2022-04-08 (×9): 1 via TRANSDERMAL
  Filled 2022-03-28 (×12): qty 1

## 2022-03-28 MED ORDER — LACTATED RINGERS IV SOLN
150.0000 mL/h | INTRAVENOUS | Status: DC
  Administered 2022-03-28: 150 mL/h via INTRAVENOUS

## 2022-03-28 MED ORDER — FERROUS SULFATE 325 (65 FE) MG PO TABS
325.0000 mg | ORAL_TABLET | Freq: Every day | ORAL | Status: DC
  Administered 2022-03-28 – 2022-04-08 (×12): 325 mg via ORAL
  Filled 2022-03-28 (×12): qty 1

## 2022-03-28 MED ORDER — ENOXAPARIN SODIUM 40 MG/0.4ML IJ SOSY: 40.0000 mg | PREFILLED_SYRINGE | INTRAMUSCULAR | Status: DC

## 2022-03-28 MED ORDER — BUDESONIDE 0.5 MG/2ML IN SUSP
0.5000 mg | Freq: Two times a day (BID) | RESPIRATORY_TRACT | Status: DC
  Administered 2022-03-28 – 2022-03-30 (×5): 0.5 mg via RESPIRATORY_TRACT
  Filled 2022-03-28 (×5): qty 2

## 2022-03-28 MED ORDER — GUAIFENESIN ER 600 MG PO TB12
600.0000 mg | ORAL_TABLET | Freq: Two times a day (BID) | ORAL | Status: DC
  Administered 2022-03-28 – 2022-04-08 (×23): 600 mg via ORAL
  Filled 2022-03-28 (×23): qty 1

## 2022-03-28 MED ORDER — PROSIGHT PO TABS
1.0000 | ORAL_TABLET | Freq: Every day | ORAL | Status: DC
  Administered 2022-03-28 – 2022-04-08 (×11): 1 via ORAL
  Filled 2022-03-28 (×14): qty 1

## 2022-03-28 MED ORDER — METHOCARBAMOL 500 MG PO TABS
500.0000 mg | ORAL_TABLET | Freq: Four times a day (QID) | ORAL | Status: DC | PRN
  Administered 2022-03-28 – 2022-04-08 (×20): 500 mg via ORAL
  Filled 2022-03-28 (×21): qty 1

## 2022-03-28 MED ORDER — SODIUM CHLORIDE 0.9 % IV SOLN
3.0000 g | Freq: Once | INTRAVENOUS | Status: AC
  Administered 2022-03-28: 3 g via INTRAVENOUS
  Filled 2022-03-28: qty 8

## 2022-03-28 NOTE — ED Provider Notes (Signed)
[facility] Provider Note    Event Date/Time   First MD Initiated Contact with Patient 03/28/22 0102     (approximate)   History   Chief Complaint Respiratory Distress   HPI  Kerri Carter is a 82 y.o. female with past medical history of hypertension, rheumatoid arthritis, CKD, iron deficiency anemia, COPD, chronic hypoxic respiratory failure on 3 L nasal cannula, diastolic CHF, atrial fibrillation, PAD, and bipolar disorder who presents to the ED complaining of shortness of breath.  Patient reports that she has been feeling increasingly short of breath over the past 12 hours with a cough productive of yellowish sputum.  She denies any associated fevers and has not had any pain in her chest.  She has been using her nebulizer at home with partial relief of symptoms.  Per EMS, patient was found to be in respiratory distress and tripoding on the side of her bed.  She was noted to have an oxygen saturation of 75% on her usual 3 L nasal cannula, subsequently placed on CPAP.  Patient reports feeling better following application of CPAP mask.     Physical Exam   Triage Vital Signs: ED Triage Vitals [03/28/22 0102]  Enc Vitals Group     BP      Pulse      Resp      Temp      Temp src      SpO2      Weight 176 lb 5.9 oz (80 kg)     Height '4\' 11"'$  (1.499 m)     Head Circumference      Peak Flow      Pain Score 0     Pain Loc      Pain Edu?      Excl. in [place]?     Most recent vital signs: Vitals:   03/28/22 0105 03/28/22 0130  BP: 134/77 134/80  Pulse: (!) 107 95  Resp: (!) 31 (!) 24  Temp: 99 F (37.2 C)   SpO2: 91% 94%    Constitutional: Alert and oriented. Eyes: Conjunctivae are normal. Head: Atraumatic. Nose: No congestion/rhinnorhea. Mouth/Throat: Mucous membranes are moist. Cardiovascular: Tachycardic, regular rhythm. Grossly normal heart sounds.  2+ radial pulses bilaterally. Respiratory: Tachypneic with increased respiratory effort, no  accessory muscle use noted.  Expiratory wheezing noted throughout along with rhonchi. Gastrointestinal: Soft and nontender. No distention. Musculoskeletal: No lower extremity tenderness nor edema.  Neurologic:  Normal speech and language. No gross focal neurologic deficits are appreciated.    ED Results / Procedures / Treatments   Labs (all labs ordered are listed, but only abnormal results are displayed) Labs Reviewed  CBC WITH DIFFERENTIAL/PLATELET - Abnormal; Notable for the following components:      Result Value   WBC 17.4 (*)    RBC 3.79 (*)    Hemoglobin 10.5 (*)    HCT 34.2 (*)    Neutro Abs 12.6 (*)    Eosinophils Absolute 0.7 (*)    Abs Immature Granulocytes 0.46 (*)    All other components within normal limits  COMPREHENSIVE METABOLIC PANEL - Abnormal; Notable for the following components:   Glucose, Bld 151 (*)    BUN 26 (*)    Creatinine, Ser 1.44 (*)    Calcium 8.4 (*)    Albumin 3.1 (*)    GFR, Estimated 37 (*)    All other components within normal limits  BRAIN NATRIURETIC PEPTIDE - Abnormal; Notable for the following components:  B Natriuretic Peptide 450.9 (*)    All other components within normal limits  BLOOD GAS, VENOUS - Abnormal; Notable for the following components:   pO2, Ven 51 (*)    Bicarbonate 29.8 (*)    Acid-Base Excess 4.5 (*)    All other components within normal limits  TROPONIN I (HIGH SENSITIVITY) - Abnormal; Notable for the following components:   Troponin I (High Sensitivity) 38 (*)    All other components within normal limits  RESP PANEL BY RT-PCR (RSV, FLU A&B, COVID)  RVPGX2  CULTURE, BLOOD (ROUTINE X 2)  CULTURE, BLOOD (ROUTINE X 2)  PROCALCITONIN  LACTIC ACID, PLASMA  LACTIC ACID, PLASMA  TROPONIN I (HIGH SENSITIVITY)     EKG  ED ECG REPORT I, Blake Divine, the attending physician, personally viewed and interpreted this ECG.   Date: 03/28/2022  EKG Time: 1:03  Rate: 102  Rhythm: sinus tachycardia  Axis: Normal   Intervals:none  ST&T Change: LVH  RADIOLOGY Chest x-ray reviewed and interpreted by me with multifocal infiltrates, no edema or effusion noted.  PROCEDURES:  Critical Care performed: Yes, see critical care procedure note(s)  .Critical Care  Performed by: Blake Divine, MD Authorized by: Blake Divine, MD   Critical care provider statement:    Critical care time (minutes):  30   Critical care time was exclusive of:  Separately billable procedures and treating other patients and teaching time   Critical care was necessary to treat or prevent imminent or life-threatening deterioration of the following conditions:  Sepsis   Critical care was time spent personally by me on the following activities:  Development of treatment plan with patient or surrogate, discussions with consultants, evaluation of patient's response to treatment, examination of patient, ordering and review of laboratory studies, ordering and review of radiographic studies, ordering and performing treatments and interventions, pulse oximetry, re-evaluation of patient's condition and review of old charts   I assumed direction of critical care for this patient from another provider in my specialty: no     Care discussed with: admitting provider      MEDICATIONS ORDERED IN ED: Medications  azithromycin (ZITHROMAX) 500 mg in sodium chloride 0.9 % 250 mL IVPB (has no administration in time range)  ipratropium-albuterol (DUONEB) 0.5-2.5 (3) MG/3ML nebulizer solution 9 mL (9 mLs Nebulization Given 03/28/22 0127)  methylPREDNISolone sodium succinate (SOLU-MEDROL) 125 mg/2 mL injection 125 mg (125 mg Intravenous Given 03/28/22 0127)  Ampicillin-Sulbactam (UNASYN) 3 g in sodium chloride 0.9 % 100 mL IVPB (3 g Intravenous New Bag/Given 03/28/22 0148)     IMPRESSION / MDM / [place] / ED COURSE  I reviewed the triage vital signs and the nursing notes.                              82 y.o. female with past medical  history of hypertension, rheumatoid arthritis, COPD, chronic hypoxic respiratory failure on 3 L nasal cannula, diastolic CHF, atrial fibrillation, PAD, CKD, anemia, and bipolar disorder who presents to the ED with respiratory distress increasing over the past 12 hours with productive cough.  Patient's presentation is most consistent with acute presentation with potential threat to life or bodily function.  Differential diagnosis includes, but is not limited to, ACS, PE, pneumonia, COPD exacerbation, CHF, anemia, electrolyte abnormality, COVID-19, influenza.  Patient ill-appearing and in mild respiratory distress on arrival, does report CPAP has helped and her work of breathing seems to be improving  at this time.  She was placed on her usual 3 L nasal cannula and currently maintaining oxygen saturations at 91% on this.  We will manage her ongoing wheezing with IV Solu-Medrol and DuoNebs, plan to hold off on BiPAP for now but will closely monitor.  Patient noted to be tachycardic with significant leukocytosis on initial labs, chest x-ray concerning for multifocal pneumonia.  We will start patient on IV antibiotics, plan to draw blood cultures and lactic acid as she meets sepsis criteria.  EKG shows no ischemic changes and I have low suspicion for ACS or PE at this time.  VBG is reassuring with no significant hypercapnia.  BMP shows stable chronic kidney disease with no acute electrolyte abnormality, LFTs are unremarkable.  Troponin mildly elevated but low suspicion for ACS at this time and we will trend.  Patient currently breathing comfortably on her usual 3 L nasal cannula, work of breathing improved following DuoNeb's.  Case discussed with hospitalist for admission for further treatment of multifocal pneumonia along with COPD exacerbation.      FINAL CLINICAL IMPRESSION(S) / ED DIAGNOSES   Final diagnoses:  COPD exacerbation ([place])  Multifocal pneumonia     Rx / DC Orders   ED Discharge Orders      None        Note:  This document was prepared using Dragon voice recognition software and may include unintentional dictation errors.   Blake Divine, MD 03/28/22 [phone]

## 2022-03-28 NOTE — Assessment & Plan Note (Signed)
CPAP ordered

## 2022-03-28 NOTE — Assessment & Plan Note (Signed)
Renal function appears near baseline.  Monitor BMP daily

## 2022-03-28 NOTE — Assessment & Plan Note (Signed)
Severe sepsis present on admission with leukocytosis, tachycardia in the setting of multifocal pneumonia, elevated lactic acid at 2.2 and increased oxygen requirement consistent with acute on chronic respiratory failure indicating organ dysfunction and severe sepsis. --Continue IV Unasyn, Zithromax --Management of COPD exacerbation as outlined --Pulmonary hygiene --O2 per protocol --Collect sputum culture --Mucolytic's

## 2022-03-28 NOTE — Assessment & Plan Note (Signed)
Baseline O2 requirement is 3 L/min, due to COPD and ILD. Presented with severe dyspnea in setting of multifocal PNA and secondary COPD exacerbation.  On admission, requiring 4 L/min O2. --Supplement O2 for goal sats 88-94% --Mgmt of underlying issues as outlined --Incentive spirometer & flutter --Will consider pulmonology consult if slow to improve

## 2022-03-28 NOTE — Assessment & Plan Note (Signed)
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

## 2022-03-28 NOTE — Assessment & Plan Note (Addendum)
Myocardial injury - Mild troponin elevation on admission, patient had no chest pain.  Trop trend 38 >> 122 >> 56. Due to sepsis and respiratory failure, not consistent with ACS.

## 2022-03-28 NOTE — Assessment & Plan Note (Addendum)
Continue home gabapentin. No apparent acute issues.  Monitor. Pain control as needed.

## 2022-03-28 NOTE — Assessment & Plan Note (Addendum)
Follows with pulmonology, Dr. Patsey Berthold. Currently complicated by multifocal pneumonia and COPD exacerbation.

## 2022-03-28 NOTE — Progress Notes (Signed)
   03/28/22 0700  Provider Notification  Provider Name/Title Dr. Arbutus Ped  Date Provider Notified 03/28/22  Time Provider Notified 0725  Method of Notification  (secure chat)  Notification Reason Critical Result (Trop 122)  Test performed and critical result Troponin 122  Date Critical Result Received 03/28/22  Time Critical Result Received 0725  Provider response En route   Received a call from lab for a critical trop value of troponin 122. Previous troponin was 38 at 0100 03/28/2022. Patient denies chest pain and discomfort. Dr. Arbutus Ped notified through secure chat. No new orders thus far.

## 2022-03-28 NOTE — Assessment & Plan Note (Addendum)
Secondary to multifocal pneumonia.  Patient presented with severe dyspnea and diffuse wheezing on exam.  On chronic prednisone. Now requiring BiPAP in stepdown unit --Consult pulmonology -- Continue IV Solu-Medrol 40 mg IV twice daily --IV Lasix 40 mg x 1 this AM, further diuresis PRN --Scheduled DuoNebs, as needed albuterol nebs --Pulmicort nebs 3 times daily --Mucinex --O2 per protocol --Pulmonary hygiene

## 2022-03-28 NOTE — Assessment & Plan Note (Signed)
Continue Eliquis, statin

## 2022-03-28 NOTE — Assessment & Plan Note (Signed)
Hold leflunomide with acute infection

## 2022-03-28 NOTE — Plan of Care (Signed)
  Problem: Fluid Volume: Goal: Hemodynamic stability will improve Outcome: Progressing   Problem: Clinical Measurements: Goal: Diagnostic test results will improve Outcome: Progressing Goal: Signs and symptoms of infection will decrease Outcome: Progressing   Problem: Respiratory: Goal: Ability to maintain adequate ventilation will improve Outcome: Progressing   Problem: Education: Goal: Knowledge of General Education information will improve Description: Including pain rating scale, medication(s)/side effects and non-pharmacologic comfort measures Outcome: Progressing   

## 2022-03-28 NOTE — Assessment & Plan Note (Signed)
No apparent acute issues.  Monitor.

## 2022-03-28 NOTE — Progress Notes (Signed)
CODE SEPSIS - PHARMACY COMMUNICATION  **Broad Spectrum Antibiotics should be administered within 1 hour of Sepsis diagnosis**  Time Code Sepsis Called/Page Received: [id number]  Antibiotics Ordered: Unasyn, Azithromycin  Time of 1st antibiotic administration: 0148  Renda Rolls, PharmD, [facility] 03/28/2022 2:57 AM

## 2022-03-28 NOTE — H&P (Addendum)
History and Physical    Patient: Kerri Carter [id number] DOB: March 19, 1940 DOA: 03/28/2022 DOS: the patient was seen and examined on 03/29/2022 PCP: Carollee Leitz, MD  Patient coming from: Home  Chief Complaint:  Chief Complaint  Patient presents with   Respiratory Distress   HPI: Kerri Carter is a 82 y.o. female with medical history significant of chronic respiratory failure with hypoxia on 3 L/min O2 at baseline, COPD on chronic prednisone, OSA, interstitial lung disease, chronic diastolic CHF, hypertension, paroxysmal A-fib, valvular heart disease, PAD, chronic venous insufficiency, renal artery stenosis, GERD, RA, peripheral neuropathy, lumbar radiculopathy, CKD stage IIIb, chronic leukocytosis, bipolar disorder, chronic pain with lumbar radiculopathy.  She presented to the ED on the evening of 03/27/2022 for evaluation of worsening shortness of breath and in respiratory distress.    Patient reports progressive shortness of breath over the past couple of days, day of admission was using her nebulizer frequently went without improvement.  States that when she laid down last night she had a severe attack and was not able to catch her breath.  She mentioned this to her daughter who called EMS.  On EMS arrival, they found O2 sat 70% on her baseline 3 L/min O2.  She reported intermittent hot sweats consistent with subjective fevers, cough productive of thick phlegm.  Otherwise she reports feeling generally weak and fatigued.  She denies other recent illnesses including nausea vomiting or diarrhea, dysuria or urinary frequency, unilateral weakness numbness or tingling.  ED course: Initial vitals temp 99, HR 107, RR 17, BP 134/77, O2 sat 91% on 4 L/min [place] O2. Labs were obtained and notable for leukocytosis 17.4, mild anemia 10.5 (previously 12.0 on 03/10/2022), negative for COVID-19, flu A/B, RSV, initial lactic acid 2.2 with repeat normalized 1.4, initial procalcitonin less than 0.10 with repeat  elevated 0.76, at bedtime troponin was mildly elevated initially 38 but trended up to 122, BNP mildly elevated 450.9.  Metabolic panel notable for creatinine 1.44, glucose 151, BUN 26, calcium 8.4 with mildly low albumin 3.1.  Venous blood gas pH 7.42, pCO2 normal 46, pO2 low at 51. Imaging: Chest x-ray showed multifocal pneumonia with bilateral patchy airspace opacities.  Patient was started on IV antibiotics for community-acquired pneumonia, systemic steroids for exacerbation of COPD and admitted to hospitalist service for further evaluation management.    Review of Systems: As mentioned in the history of present illness. All other systems reviewed and are negative.   Past Medical History:  Diagnosis Date   Acromial process of scapula fracture 12/21/2019   Acute kidney injury superimposed on chronic kidney disease ([place]) 05/26/2021   Acute on chronic combined systolic (congestive) and diastolic (congestive) heart failure ([place]) 07/28/2021   Acute on chronic combined systolic and diastolic CHF (congestive heart failure) ([place]) 05/26/2021   Acute on chronic respiratory failure with hypoxia ([place]) 09/10/2021   Acute pain of left knee 12/18/2020   Anemia in stage 3a chronic kidney disease ([place]) 10/09/2021   Anxiety    Anxiety 01/20/2019   Anxiety and depression 05/26/2021   Aortic stenosis 07/09/2015   a.) TTE 07/06/2015: EF 55-60%; mild AS with MPG 13.0 mmHg.   Arrhythmia    atrial fibrillation   Assistance needed for mobility 12/18/2020   Asthma    At high risk for falls 03/16/2019   Avascular necrosis of left shoulder due to adverse effect of steroid therapy ([place]) 01/20/2018   Bipolar disorder ([place])    C. difficile diarrhea 2010   a.) following ABX  course during hospital admission   Carotid atherosclerosis, bilateral    a.) Moderate; < 50% stenosis BILATERAL ICAs.   Cataract    a.) s/p BILATERAL extraction   CHF (congestive heart failure) (HCC)    Chicken pox    Chronic left  shoulder pain 08/29/2019   Chronic respiratory failure with hypoxia ([place]) 10/01/2021   CKD (chronic kidney disease), stage III (HCC)    a. s/p R nephrectomy./ aneurysm   Closed fracture of acromial process of scapula 10/28/2018   Closed nondisplaced fracture of proximal phalanx of lesser toe of right foot    Collapsed vertebra, not elsewhere classified, lumbar region, initial encounter for fracture ([place]) 02/21/2019   Community acquired pneumonia [id number]   Complicated grief [id number]   Compression fracture of L4 vertebra () 02/21/2019   Conversion disorder    Conversion disorder 08/04/2015   COPD (chronic obstructive pulmonary disease) ([place])    COPD with exacerbation ([place]) 08/20/2021   Cough 05/02/2012   Depression    Dislocation of hip joint prosthesis ([place]) 01/08/2017   Elevated brain natriuretic peptide (BNP) level 12/18/2020   Elevated d-dimer 05/26/2021   Episodic mood disorder ([place]) 04/18/2019   Essential hypertension    Fatigue 07/07/2018   Frequent falls 12/18/2020   GERD (gastroesophageal reflux disease)    H/O cardiac catheterization 02/24/2011   Formatting of this note might be different from the original.  Repeated at [facility] 2/13 and insignificant disease   Hav (hallux abducto valgus), unspecified laterality 12/05/2018   Heart murmur    Hip fracture ([place]) 10/31/2016   Hip pain 12/18/2020   History of fracture of left hip 01/15/2017   History of syncope 08/25/2016   Hyperkalemia 05/26/2021   - The patient was given p.o. Lokelma and IV Lasix.  - We will follow potassium level.   Hyperlipidemia    Hypokalemia 08/25/2016   ILD (interstitial lung disease) (HCC)    mild; 2/2 RA diagnosis   Iliopsoas bursitis of right hip 08/29/2019   Inflammatory arthritis    a. hands/carpal tunnel.  b. Low titer rheumatoid factor. c. Negative anti-CCP antibodies. d. Plaquenil.   Insomnia 07/17/2015   Left foot pain 10/19/2016   Left lower lobe pneumonia 05/26/2021   Leg  edema 05/02/2019   Leg pain, bilateral    Leg swelling 12/15/2016   Leukocytosis 10/19/2017   Leukopenia 06/24/2021   Lymphocytosis 07/13/2021   Macular degeneration    Microcytic anemia 08/15/2015   Mixed bipolar I disorder ([place]) 10/09/2015   Nocturnal hypoxemia    Nocturnal hypoxemia 09/09/2011   June 2013 overnight oximetry on 6 centimeters of water CPAP: SpO2 less than 88% 72% of the time   Non-Obstructive CAD    a. 07/2009 Cath ([facility]): nonobs dzs;  b. 03/2011 Cath [facility]): nonobs dzs.   NSIP (nonspecific interstitial pneumonitis) ([place]) 12/08/2019   Osteoarthritis    a. Knees.   Other shoulder lesions, left shoulder 12/20/2017   Other specified postprocedural states 07/20/2013   Overweight (BMI 25.0-29.9) 08/29/2019   PAD (peripheral artery disease) (HCC)    Pain due to onychomycosis of toenails of both feet 12/05/2018   Pelvic mass in female 03/16/2019   02/21/19 CT pelvis w/o contrast  Cystic left adnexal lesion has enlarged since 2018 where it  measured approximately 1.8 x 1.7 cm. This is not well assessed due  to streak artifact as well as lack of contrast. Ultrasound follow-up  may be helpful in 8-12 weeks      04/05/19 TVUS  IMPRESSION:  Simple appearing cystic structure within the left ovary measuring up  to 2.9 cm. While this has enlarged s   Physical deconditioning 02/27/2021   Pleural effusion on left 12/18/2020   PUD (peptic ulcer disease)    Recurrent falls 09/01/2021   Recurrent pneumonia 07/23/2021   Repeated falls 12/18/2020   Respiratory failure with hypoxia ([place]) 01/12/2021   Rotator cuff tendinitis, right 07/26/2017   S/P lumbar fusion 05/23/2021   S/P right hip fracture    11/01/16 s/p repair   Sepsis due to pneumonia ([place]) 07/22/2021   Shoulder pain    Sleep apnea    no cpap / minimal   SOB (shortness of breath) 10/13/2011   2011 [facility] Pulmonary eval >> LHC (non-obstructive CAD), RHC (wedge 13, mean PA 26), TTE (LVEF > 55%, mild concentric  LVH, mild AR, MR, PR, TR), CTAngio chest unrevealing; "well controlled asthma"  02/2011 TTE [facility]>> NL LVEF, Mild LVH, Mod AR/Mod MR   Spinal stenosis at L4-L5 level    severe with L4/L5 anterolisthesis grade 1 anterolisthesis    Spinal stenosis of lumbar region 09/15/2016   Last Assessment & Plan:   Now post op with continued back and leg pain noted. Is working with pt/ot and surgery was said to have no complications. Bowels are moving and general pain and anxiety is basically baseline.    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   Weakness of both lower extremities 12/18/2020   Past Surgical History:  Procedure Laterality Date   APPENDECTOMY     APPLICATION OF INTRAOPERATIVE CT SCAN N/A 05/23/2021   Procedure: APPLICATION OF INTRAOPERATIVE CT SCAN;  Surgeon: Meade Maw, MD;  Location: [facility] ORS;  Service: Neurosurgery;  Laterality: N/A;   BACK SURGERY     lumbar fusion   BUNIONECTOMY Right    CATARACT EXTRACTION, BILATERAL     CESAREAN SECTION     x1   CHOLECYSTECTOMY N/A 05/11/2016   Procedure: LAPAROSCOPIC CHOLECYSTECTOMY;  Surgeon: Florene Glen, MD;  Location: [facility] ORS;  Service: General;  Laterality: N/A;   COLONOSCOPY WITH PROPOFOL N/A 04/02/2016   Procedure: COLONOSCOPY WITH PROPOFOL;  Surgeon: Jonathon Bellows, MD;  Location: [facility] END[facility]COPY;  Service: Endoscopy;  Laterality: N/A;   ENDOSCOPIC RETROGRADE CHOLANGIOPANCREATOGRAPHY (ERCP) WITH PROPOFOL N/A 05/08/2016    Procedure: ENDOSCOPIC RETROGRADE CHOLANGIOPANCREATOGRAPHY (ERCP) WITH PROPOFOL;  Surgeon: Lucilla Lame, MD;  Location: [facility] END[facility];  Service: Endoscopy;  Laterality: N/A;   ERCP     with biliary spincterotomy 05/08/16 Dr. Allen Norris for choledocholithiasis    ESOPHAGEAL DILATION  04/02/2016   Procedure: ESOPHAGEAL DILATION;  Surgeon: Jonathon Bellows, MD;  Location: [facility];  Service: Endoscopy;;   ESOPHAGOGASTRODUODENOSCOPY (EGD) WITH PROPOFOL N/A 04/02/2016   Procedure: ESOPHAGOGASTRODUODENOSCOPY (EGD) WITH PROPOFOL;  Surgeon: Jonathon Bellows, MD;  Location: [facility];  Service: Endoscopy;  Laterality: N/A;   HIP ARTHROPLASTY Right 11/01/2016   Procedure: ARTHROPLASTY BIPOLAR HIP (HEMIARTHROPLASTY);  Surgeon: Corky Mull, MD;  Location: [facility];  Service: Orthopedics;  Laterality: Right;   LUMBAR LAMINECTOMY/DECOMPRESSION MICRODISCECTOMY N/A 12/11/2020   Procedure: LEFT L2-3 MICRODISCECTOMY, L3-4 AND L5-S1 DECOMPRESSION;  Surgeon: Meade Maw, MD;  Location: [facility];  Service: Neurosurgery;  Laterality: N/A;   NEPHRECTOMY  1988   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   ORIF SCAPHOID FRACTURE Left 12/21/2019   Procedure: OPEN REDUCTION INTERNAL FIXATION (ORIF) OF LEFT SCAPULAR NONUNION WITH BONE GRAFT;  Surgeon: Corky Mull, MD;  Location: [facility];  Service: Orthopedics;  Laterality: Left;   osteoporosis     noted DEXA 08/19/16    REPLACEMENT TOTAL KNEE Right    REVERSE SHOULDER ARTHROPLASTY Right 11/04/2017   Procedure: REVERSE SHOULDER ARTHROPLASTY;  Surgeon: Corky Mull, MD;  Location: [facility];  Service: Orthopedics;  Laterality: Right;   REVERSE SHOULDER ARTHROPLASTY Left 07/26/2018   Procedure: REVERSE SHOULDER ARTHROPLASTY;  Surgeon: Corky Mull, MD;  Location: [facility];  Service: Orthopedics;  Laterality: Left;   TONSILLECTOMY     TOTAL HIP ARTHROPLASTY  12/10/11   [facility] left hip   TOTAL HIP ARTHROPLASTY Bilateral    TRANSFORAMINAL LUMBAR INTERBODY  FUSION (TLIF) WITH PEDICLE SCREW FIXATION 3 LEVEL N/A 05/23/2021   Procedure: OPEN L2-4 TRANSFORAMINAL LUMBAR INTERBODY FUSION (TLIF) WITH L2-5 POSTERIOR SPINAL FUSION;  Surgeon: Meade Maw, MD;  Location: [facility];  Service: Neurosurgery;  Laterality: N/A;   TUBAL LIGATION     Social History:  reports that she quit smoking about 48 years ago. Her smoking use included cigarettes. She has a 10.00 pack-year smoking history. She has never used smokeless tobacco. She reports that she does not drink alcohol and does not use drugs.  Allergies  Allergen Reactions   Ceftin [Cefuroxime Axetil] Anaphylaxis   Lisinopril Anaphylaxis   Sulfa Antibiotics Other (See Comments)    Face swelling   Sulfasalazine Anaphylaxis   Morphine Other (See Comments)    Per patient, low blood pressure issues that requires action to raise it back up. Can take small infrequent doses   Xarelto [Rivaroxaban] Other (See Comments)    Stomach burning, bleeding, and tar in stool   Adhesive [Tape] Rash    Paper tape and tega derm OK   Antihistamines, Chlorpheniramine-Type Other (See Comments)    Makes pt hyper   Antivert [Meclizine Hcl] Other (See Comments)    Bladder will not empty   Aspirin Other (See Comments)    Sulfasalazine allergy cross reacts   Contrast Media [Iodinated Contrast Media] Rash    she is able to use betadine scrubs.   Decongestant [Pseudoephedrine Hcl] Other (See Comments)    Makes pt hyper   Doxycycline Other (See Comments)    GI upset   Levaquin [Levofloxacin In D5w] Rash   Polymyxin B Other (See Comments)    Facial rash   Tetanus Toxoids Rash and Other (See Comments)    Fever and hot to touch at injection site    Family History  Problem Relation Age of Onset   Rheum arthritis Mother    Asthma Mother    Parkinson's disease Mother    Heart disease Mother    Stroke Mother    Hypertension Mother    Heart attack Father    Heart disease Father    Hypertension Father    Peripheral  Artery Disease Father    Diabetes Son    Gout Son    Asthma Sister    Heart disease Sister    Lung cancer Sister  Heart disease Sister    Heart disease Sister    Breast cancer Sister    Heart attack Sister    Heart disease Brother    Heart disease Maternal Grandmother    Diabetes Maternal Grandmother    Colon cancer Maternal Grandmother    Cancer Maternal Grandmother        Hodgkins lymphoma   Heart disease Brother    Alcohol abuse Brother    Depression Brother    Dementia Son     Prior to Admission medications   Medication Sig Start Date End Date Taking? Authorizing Provider  amLODipine (NORVASC) 5 MG tablet Take 1 tablet (5 mg total) by mouth daily. 08/01/21  Yes McLean-Scocuzza, Nino Glow, MD  apixaban (ELIQUIS) 5 MG TABS tablet Take 1 tablet (5 mg total) by mouth 2 (two) times daily. 10/15/21  Yes McLean-Scocuzza, Nino Glow, MD  budesonide (PULMICORT) 0.5 MG/2ML nebulizer solution Take 2 mLs (0.5 mg total) by nebulization 2 (two) times daily. 01/01/22 01/01/23 Yes [name], MD  busPIRone (BUSPAR) 5 MG tablet Take 1 tablet (5 mg total) by mouth 2 (two) times daily. 06/02/21  Yes Jennye Boroughs, MD  dicyclomine (BENTYL) 10 MG capsule TAKE 1 CAPSULE (10 MG TOTAL) BY MOUTH 2 (TWO) TIMES DAILY AS NEEDED FOR SPASMS. 11/03/21 10/29/22 Yes Jonathon Bellows, MD  escitalopram (LEXAPRO) 10 MG tablet Take 1 tablet (10 mg total) by mouth daily. 08/01/21  Yes McLean-Scocuzza, Nino Glow, MD  albuterol (PROVENTIL) (2.5 MG/3ML) 0.083% nebulizer solution Take by nebulization. 12/08/21   [provider]  albuterol (VENTOLIN HFA) 108 (90 Base) MCG/ACT inhaler Inhale 2 puffs into the lungs every 6 (six) hours as needed for wheezing or shortness of breath. 06/24/21   McLean-Scocuzza, Nino Glow, MD  cetirizine (ZYRTEC ALLERGY) 10 MG tablet Take 1 tablet (10 mg total) by mouth at bedtime. 02/26/22   [name], MD  Cholecalciferol (VITAMIN D3) 50 MCG (2000 UT) TABS Take 4,000 Units by mouth daily.  08/01/21   McLean-Scocuzza, Nino Glow, MD  famotidine (PEPCID) 20 MG tablet TAKE 1 TABLET (20 MG TOTAL) BY MOUTH DAILY. BEFORE BREAKFAST OR DINNER 03/24/21   McLean-Scocuzza, Nino Glow, MD  ferrous sulfate 325 (65 FE) MG EC tablet Take 1 tablet (325 mg total) by mouth 2 (two) times daily. Patient taking differently: Take 325 mg by mouth 2 (two) times daily. Pt reports MD instructed her to take twice a day every other day. 09/12/21 09/12/22  DanfordSuann Larry, MD  gabapentin (NEURONTIN) 300 MG capsule Take 300 mg in the morning and 600 mg at night. 01/06/22   Carollee Leitz, MD  lamoTRIgine (LAMICTAL) 100 MG tablet TAKE 1 TAB 2 TIMES DAILY. FURTHER REFILLS NEW PSYCH FOR ALL PSYCH MEDS ONLY TEMP SUPPLY FROM PCP 05/02/21   McLean-Scocuzza, Nino Glow, MD  leflunomide (ARAVA) 20 MG tablet Take 1 tablet (20 mg total) by mouth daily. 09/30/17   McLean-Scocuzza, Nino Glow, MD  lidocaine (LIDODERM) 5 % Place 1 patch onto the skin 2 (two) times daily as needed. Remove & Discard patch within 12 hours or as directed by MD Patient taking differently: Place 1 patch onto the skin daily. Remove & Discard patch within 12 hours or as directed by MD 11/22/20   McLean-Scocuzza, Nino Glow, MD  lovastatin (MEVACOR) 20 MG tablet TAKE 1 TABLET BY MOUTH EVERYDAY AT BEDTIME *STOP TALKING '40MG'$ * Patient taking differently: Take 20 mg by mouth at bedtime. 08/01/21   McLean-Scocuzza, Nino Glow, MD  methocarbamol (ROBAXIN) 500  MG tablet Take 500 mg by mouth 4 (four) times daily as needed. Taking twice a day currently 01/23/21   [provider]  mometasone (NASONEX) 50 MCG/ACT nasal spray 1 spray to each nostril twice a day 02/26/22   [name], MD  montelukast (SINGULAIR) 10 MG tablet TAKE 1 TABLET BY MOUTH EVERY DAY Patient taking differently: Take 10 mg by mouth daily. 03/24/21   McLean-Scocuzza, Nino Glow, MD  multivitamin-lutein [facility]) CAPS capsule Take 1 capsule by mouth at bedtime.    [provider]   nebivolol (BYSTOLIC) 5 MG tablet Take 1 tablet (5 mg total) by mouth in the morning and at bedtime. (Note dose changed from 1/2 10 mg bid to 5 mg bid) 08/01/21   McLean-Scocuzza, Nino Glow, MD  OXYGEN Inhale 3 L into the lungs at bedtime.    [provider]  pantoprazole (PROTONIX) 40 MG tablet TAKE 1 TABLET BY MOUTH 2 TIMES DAILY 30 MIN BEFORE FOOD (NOTE REDUCTION IN FREQUENCY) 11/03/21   McLean-Scocuzza, Nino Glow, MD  predniSONE (DELTASONE) 5 MG tablet TAKE 1 TABLET BY MOUTH EVERY DAY WITH BREAKFAST 02/20/22   [name], MD  QUEtiapine (SEROQUEL) 25 MG tablet TAKE 1 TABLET (25 MG TOTAL) BY MOUTH AT BEDTIME. AGAIN LAST FILL FURTHER REFILLS FROM PSYCHIATRY NO EXCEPTIONS 09/13/20   McLean-Scocuzza, Nino Glow, MD  sucralfate (CARAFATE) 1 g tablet TAKE 1 TABLET BY MOUTH 2 TIMES DAILY. 11/12/21   Jonathon Bellows, MD  torsemide (DEMADEX) 20 MG tablet Take 1 tablet (20 mg total) by mouth daily. 09/12/21   Danford, Suann Larry, MD  Vibegron (GEMTESA) 75 MG TABS Take 75 mg by mouth daily. 01/08/22   Debroah Loop, PA-C    Physical Exam: Vitals:   03/28/22 2255 03/29/22 0450 03/29/22 0500 03/29/22 0745  BP:  127/61  116/66  Pulse:  78  81  Resp:  20  16  Temp:  97.8 F (36.6 C)  98.1 F (36.7 C)  TempSrc:  Oral    SpO2: 94% 91%  92%  Weight:   79.4 kg   Height:       General exam: awake, alert, no acute distress, chronically ill-appearing HEENT: Nasal cannula in place, moist mucus membranes, hearing grossly normal  Respiratory system: Coarse bibasilar rhonchi, no expiratory wheezes, coarse sounding cough on deep inspiration, normal respiratory effort at rest, on 3.5 L/min [place] O2. Cardiovascular system: normal S1/S2, RRR, no peripheral edema.   Gastrointestinal system: soft, NT, ND, no HSM felt, +bowel sounds. Central nervous system: A&O x 4. no gross focal neurologic deficits, normal speech Extremities: moves all, no edema, normal tone Skin: dry, intact, normal  temperature Psychiatry: normal mood, congruent affect, judgement and insight appear normal    Data Reviewed:  Notable labs reviewed as outlined above.  Assessment and Plan: * Sepsis due to pneumonia ([place]) Severe sepsis present on admission with leukocytosis, tachycardia in the setting of multifocal pneumonia, elevated lactic acid at 2.2 and increased oxygen requirement consistent with acute on chronic respiratory failure indicating organ dysfunction and severe sepsis. --Continue IV Unasyn, Zithromax --Management of COPD exacerbation as outlined --Pulmonary hygiene --O2 per protocol --Collect sputum culture --Mucolytic's  Acute on chronic respiratory failure with hypoxia (HCC) Baseline O2 requirement is 3 L/min, due to COPD and ILD. Presented with severe dyspnea in setting of multifocal PNA and secondary COPD exacerbation.  On admission, requiring 4 L/min O2. --Supplement O2 for goal sats 88-94% --Mgmt of underlying issues as outlined --Incentive spirometer & flutter --Will  consider pulmonology consult if slow to improve  COPD with acute exacerbation ([place]) Secondary to multifocal pneumonia.  Patient presented with severe dyspnea and diffuse wheezing on exam.  On chronic prednisone. -- Continue IV Solu-Medrol 40 mg IV twice daily --Scheduled DuoNebs, as needed albuterol nebs --Pulmicort nebs 3 times daily --Mucinex --O2 per protocol  Demand ischemia Mild troponin elevation on admission, patient had no chest pain.  Trop trend 38 >> 122 >> 56. Due to sepsis and respiratory failure, not consistent with ACS.  Chronic heart failure with preserved ejection fraction (HCC) Last echo in June 2023 with Normal EF 0000000, grade 1 diastolic function, moderate to severe MR, moderate AR, mild to moderate AS.   Appears compensated on admission. --Continue home Bystolic, torsemide. --Daily weights to monitor volume status.  Paroxysmal atrial fibrillation (HCC) Heart rates overall  controlled, mildly elevated in the setting of sepsis. --Continue Eliquis, Bystolic --Telemetry  Interstitial pulmonary disease ([place]) Follows with pulmonology, Dr. Patsey Berthold. Currently complicated by multifocal pneumonia and COPD exacerbation.  Stage 3b chronic kidney disease ([place]) Renal function appears near baseline.  Monitor BMP daily  Essential hypertension Continued on home torsemide, Bystolic, amlodipine  History of lumbar fusion No apparent acute issues.  Monitor.  Rheumatoid arthritis ([place]) Continue home leflunomide  Gastroesophageal reflux disease Continue PPI  Hyperlipidemia Continue pravastatin  Iron deficiency anemia due to chronic blood loss Continue home iron supplement. Monitor CBC.  Urinary incontinence Continue Myrbetriq  Lumbar radiculopathy Continue home gabapentin. No apparent acute issues.  Monitor. Pain control as needed.  Bipolar disorder, in full remission, most recent episode depressed ([place]) Appears stable. Continue home Lamictal, Seroquel, Lexapro, BuSpar  PAD (peripheral artery disease) (HCC) Continue Eliquis, statin  Peripheral neuropathy Continue gabapentin  Single kidney Solitary Left Kidney with Hx of right nephrectomy.   Valvular heart disease Last echo in June 2023 with moderate to severe MR, moderate AR, mild to moderate AS.  Normal EF 0000000, grade 1 diastolic function  OSA (obstructive sleep apnea) CPAP ordered      Advance Care Planning:   Code Status: DNR Confirmed with patient.  Consults: None  Family Communication: None present, will attempt to call  Severity of Illness: The appropriate patient status for this patient is INPATIENT. Inpatient status is judged to be reasonable and necessary in order to provide the required intensity of service to ensure the patient's safety. The patient's presenting symptoms, physical exam findings, and initial radiographic and laboratory data in the context of their chronic  comorbidities is felt to place them at high risk for further clinical deterioration. Furthermore, it is not anticipated that the patient will be medically stable for discharge from the hospital within 2 midnights of admission.   * I certify that at the point of admission it is my clinical judgment that the patient will require inpatient hospital care spanning beyond 2 midnights from the point of admission due to high intensity of service, high risk for further deterioration and high frequency of surveillance required.*  Author: Ezekiel Slocumb, DO 03/29/2022 8:05 AM  For on call review [email or web address].

## 2022-03-28 NOTE — Assessment & Plan Note (Signed)
Continue gabapentin.

## 2022-03-28 NOTE — Assessment & Plan Note (Signed)
Heart rates overall controlled, mildly elevated in the setting of sepsis. --Continue Eliquis, Bystolic --Telemetry

## 2022-03-28 NOTE — Assessment & Plan Note (Signed)
Continue pravastatin 

## 2022-03-28 NOTE — Assessment & Plan Note (Signed)
Continued on home torsemide, Bystolic, amlodipine

## 2022-03-28 NOTE — Assessment & Plan Note (Signed)
Continue home iron supplement. Monitor CBC.

## 2022-03-28 NOTE — Assessment & Plan Note (Signed)
Continue PPI ?

## 2022-03-28 NOTE — Assessment & Plan Note (Signed)
Continue Myrbetriq 

## 2022-03-28 NOTE — ED Triage Notes (Signed)
BIB [facility] from home for SOB. Pt has been SOB all day so she called EMS. LS fluid filled for EMS.  HX of CHF. Extended o scene time due to gaining entry to locked residence. Pt found tripodding in respiratory distress. pt has taken her albuterol at home multiple times today with no relief. Pt has been coughing up phlegm today. 1SL NTG NTG on chest  217 BGL 20G RH 76%  CPAP 90%

## 2022-03-28 NOTE — Sepsis Progress Note (Signed)
Elink monitoring for the code sepsis protocol.  

## 2022-03-28 NOTE — Assessment & Plan Note (Addendum)
Last echo in June 2023 with Normal EF 55-60%, grade 1 diastolic function, moderate to severe MR, moderate AR, mild to moderate AS.   Appears compensated on admission. --2/26: IV Lasix 40 mg x 1, hold torsemide --Further IV diuresis based on response --Resume home torsemide --Continue home Bystolic --Daily weights to monitor volume status.

## 2022-03-28 NOTE — Assessment & Plan Note (Signed)
Last echo in June 2023 with moderate to severe MR, moderate AR, mild to moderate AS.  Normal EF 0000000, grade 1 diastolic function

## 2022-03-28 NOTE — Assessment & Plan Note (Addendum)
Appears stable. Continue home Lamictal, Seroquel, Lexapro, BuSpar

## 2022-03-29 DIAGNOSIS — J9621 Acute and chronic respiratory failure with hypoxia: Secondary | ICD-10-CM

## 2022-03-29 DIAGNOSIS — J441 Chronic obstructive pulmonary disease with (acute) exacerbation: Secondary | ICD-10-CM

## 2022-03-29 DIAGNOSIS — I5032 Chronic diastolic (congestive) heart failure: Secondary | ICD-10-CM | POA: Diagnosis not present

## 2022-03-29 DIAGNOSIS — J189 Pneumonia, unspecified organism: Secondary | ICD-10-CM | POA: Diagnosis not present

## 2022-03-29 LAB — BASIC METABOLIC PANEL
Anion gap: 8 (ref 5–15)
BUN: 29 mg/dL — ABNORMAL HIGH (ref 8–23)
CO2: 26 mmol/L (ref 22–32)
Calcium: 8.2 mg/dL — ABNORMAL LOW (ref 8.9–10.3)
Chloride: 103 mmol/L (ref 98–111)
Creatinine, Ser: 1.29 mg/dL — ABNORMAL HIGH (ref 0.44–1.00)
GFR, Estimated: 42 mL/min — ABNORMAL LOW (ref >60)
Glucose, Bld: 183 mg/dL — ABNORMAL HIGH (ref 70–99)
Potassium: 4 mmol/L (ref 3.5–5.1)
Sodium: 137 mmol/L (ref 135–145)

## 2022-03-29 LAB — CBC
HCT: 29.1 % — ABNORMAL LOW (ref 36.0–46.0)
Hemoglobin: 9.1 g/dL — ABNORMAL LOW (ref 12.0–15.0)
MCH: 27.6 pg (ref 26.0–34.0)
MCHC: 31.3 g/dL (ref 30.0–36.0)
MCV: 88.2 fL (ref 80.0–100.0)
Platelets: 345 10*3/uL (ref 150–400)
RBC: 3.3 MIL/uL — ABNORMAL LOW (ref 3.87–5.11)
RDW: 15.2 % (ref 11.5–15.5)
WBC: 19.9 10*3/uL — ABNORMAL HIGH (ref 4.0–10.5)
nRBC: 0 % (ref 0.0–0.2)

## 2022-03-29 LAB — MAGNESIUM: Magnesium: 2 mg/dL (ref 1.7–2.4)

## 2022-03-29 NOTE — Hospital Course (Addendum)
Kerri Carter is a 82 y.o. female with medical history significant of chronic respiratory failure with hypoxia on 3 L/min O2 at baseline, COPD on chronic prednisone, OSA, interstitial lung disease, chronic diastolic CHF, hypertension, paroxysmal A-fib, valvular heart disease, PAD, chronic venous insufficiency, renal artery stenosis, GERD, RA, peripheral neuropathy, lumbar radiculopathy, CKD stage IIIb, chronic leukocytosis, bipolar disorder, chronic pain with lumbar radiculopathy.  She presented to the ED on the evening of 03/27/2022 for evaluation of worsening shortness of breath and in respiratory distress.     She was found to have acute on chronic hypoxic respiratory failure due to multifocal pneumonia and secondary COPD exacerbation.  Started on IV antibiotics and IV steroids and admitted to the hospital.  2/25: remains on 4 L/min O2 with sats 91-92%. Ongoing dyspnea at rest and with conversation.  2/26: rapid response this AM, increased O2 needs, pt unresponsive with increased WOB.  Placed on BiPAP, transferred to stepdown.  ABG with pCO2 73, pH 7.1.  Pulmonology consulted.

## 2022-03-29 NOTE — Progress Notes (Signed)
PT Cancellation Note  Patient Details Name: Kerri Carter MRN: [id number] DOB: 30-Sep-1940   Cancelled Treatment:    Reason Eval/Treat Not Completed: Other (comment). Pt seated upright in the recliner chair upon PT arrival. She appeared to have greatly increased dyspnea and difficulties breathing on 5L of supplemental O2 via [place]. Pt stated that she had just gotten out of bed, completed transfers and had a BM at the bedside commode with assistance from nursing staff. Additionally, the Respiratory Therapist entered the room to give pt her breathing treatment. PT will continue to follow-up with pt acutely as available and appropriate.    Clearnce Sorrel Zymir Napoli 03/29/2022, 1:35 PM

## 2022-03-29 NOTE — Progress Notes (Signed)
Patient placed on CPAP with 4L O2 bleed, tolerating well at this time.

## 2022-03-29 NOTE — Progress Notes (Signed)
Progress Note   Patient: Kerri Carter [id number] DOB: 1941-01-16 DOA: 03/28/2022     1 DOS: the patient was seen and examined on 03/29/2022   Brief hospital course: Kerri Carter is a 82 y.o. female with medical history significant of chronic respiratory failure with hypoxia on 3 L/min O2 at baseline, COPD on chronic prednisone, OSA, interstitial lung disease, chronic diastolic CHF, hypertension, paroxysmal A-fib, valvular heart disease, PAD, chronic venous insufficiency, renal artery stenosis, GERD, RA, peripheral neuropathy, lumbar radiculopathy, CKD stage IIIb, chronic leukocytosis, bipolar disorder, chronic pain with lumbar radiculopathy.  She presented to the ED on the evening of 03/27/2022 for evaluation of worsening shortness of breath and in respiratory distress.     She was found to have acute on chronic hypoxic respiratory failure due to multifocal pneumonia and secondary COPD exacerbation.  Started on IV antibiotics and IV steroids and admitted to the hospital.  2/25: remains on 4 L/min O2 with sats 91-92%. Ongoing dyspnea at rest and with conversation.  Assessment and Plan: * Sepsis due to pneumonia ([employer]) Severe sepsis present on admission with leukocytosis, tachycardia in the setting of multifocal pneumonia, elevated lactic acid at 2.2 and increased oxygen requirement consistent with acute on chronic respiratory failure indicating organ dysfunction and severe sepsis. --Continue IV Unasyn, Zithromax --Management of COPD exacerbation as outlined --Pulmonary hygiene --O2 per protocol --Collect sputum culture --Mucolytic's  Acute on chronic respiratory failure with hypoxia (HCC) Baseline O2 requirement is 3 L/min, due to COPD and ILD. Presented with severe dyspnea in setting of multifocal PNA and secondary COPD exacerbation.  On admission, requiring 4 L/min O2. --Supplement O2 for goal sats 88-94% --Mgmt of underlying issues as outlined --Incentive spirometer & flutter --Will  consider pulmonology consult if slow to improve  COPD with acute exacerbation ([place]) Secondary to multifocal pneumonia.  Patient presented with severe dyspnea and diffuse wheezing on exam.  On chronic prednisone. -- Continue IV Solu-Medrol 40 mg IV twice daily --Scheduled DuoNebs, as needed albuterol nebs --Pulmicort nebs 3 times daily --Mucinex --O2 per protocol --Pulmonary hygiene  Demand ischemia Mild troponin elevation on admission, patient had no chest pain.  Trop trend 38 >> 122 >> 56. Due to sepsis and respiratory failure, not consistent with ACS.  Chronic heart failure with preserved ejection fraction (HCC) Last echo in June 2023 with Normal EF 0000000, grade 1 diastolic function, moderate to severe MR, moderate AR, mild to moderate AS.   Appears compensated on admission. --Continue home Bystolic, torsemide. --Daily weights to monitor volume status.  Paroxysmal atrial fibrillation (HCC) Heart rates overall controlled, mildly elevated in the setting of sepsis. --Continue Eliquis, Bystolic --Telemetry  Interstitial pulmonary disease ([place]) Follows with pulmonology, Dr. Patsey Berthold. Currently complicated by multifocal pneumonia and COPD exacerbation.  Stage 3b chronic kidney disease ([place]) Renal function appears near baseline.  Monitor BMP daily  Essential hypertension Continued on home torsemide, Bystolic, amlodipine  History of lumbar fusion No apparent acute issues.  Monitor.  Rheumatoid arthritis ([place]) Continue home leflunomide  Gastroesophageal reflux disease Continue PPI  Hyperlipidemia Continue pravastatin  Iron deficiency anemia due to chronic blood loss Continue home iron supplement. Monitor CBC.  Urinary incontinence Continue Myrbetriq  Lumbar radiculopathy Continue home gabapentin. No apparent acute issues.  Monitor. Pain control as needed.  Chronic pain .  Bipolar disorder, in full remission, most recent episode depressed ([place]) Appears  stable. Continue home Lamictal, Seroquel, Lexapro, BuSpar  PAD (peripheral artery disease) (HCC) Continue Eliquis, statin  Chronic venous insufficiency .  Peripheral  neuropathy Continue gabapentin  Single kidney Solitary Left Kidney with Hx of right nephrectomy.   Valvular heart disease Last echo in June 2023 with moderate to severe MR, moderate AR, mild to moderate AS.  Normal EF 0000000, grade 1 diastolic function  OSA (obstructive sleep apnea) CPAP ordered        Subjective: Pt awake resting in bed when seen this AM.  She continues to be quite short of breath.  Denies fever/chills, N/V, CP.  Ongoing cough.  Difficulty sleeping last night.  Kept taking off CPAP mask in her sleep.  States no longer using her old CPAP at home bc of missing parts and mask not fitting well.    Physical Exam: Vitals:   03/29/22 0450 03/29/22 0500 03/29/22 0745 03/29/22 0815  BP: 127/61  116/66   Pulse: 78  81   Resp: 20  16   Temp: 97.8 F (36.6 C)  98.1 F (36.7 C)   TempSrc: Oral     SpO2: 91%  92% 91%  Weight:  79.4 kg    Height:       General exam: awake, alert, no acute distress, chronically ill-appearing HEENT: moist mucus membranes, hearing grossly normal  Respiratory system: severely diminished breath sounds, no active wheezes, increased respiratory effort at resting with accessory muscle use and conversational dyspnea, on 4 L/min [place] O2. Cardiovascular system: normal S1/S2, RRR, no pedal edema, 2+ DP pulses.   Gastrointestinal system: soft, NT, ND, no HSM felt, +bowel sounds. Central nervous system: A&O x3. no gross focal neurologic deficits, normal speech Extremities: moves all, no edema, normal tone Skin: dry, intact, normal temperature Psychiatry: normal mood, congruent affect, judgement and insight appear normal   Data Reviewed:  Notable labs --- glucose 183, Cr 1.29 from 1.26 stable, Ca 8.2.     Family Communication: None present, will attempt to  call  Disposition: Status is: Inpatient Remains inpatient appropriate because: severity of illness as above, remains on IV therapies, ongoing significant dyspnea even at rest   Planned Discharge Destination: Home    Time spent: 38 minutes  Author: Ezekiel Slocumb, DO 03/29/2022 11:36 AM  For on call review [email or web address].

## 2022-03-30 ENCOUNTER — Ambulatory Visit
Admission: RE | Admit: 2022-03-30 | Discharge: 2022-03-30 | Disposition: A | Discharge status: Home / Self Care | Payer: Medicare PPO | Source: Ambulatory Visit | Attending: Pulmonary Disease | Admitting: Pulmonary Disease

## 2022-03-30 ENCOUNTER — Inpatient Hospital Stay: Payer: Medicare PPO

## 2022-03-30 DIAGNOSIS — I340 Nonrheumatic mitral (valve) insufficiency: Secondary | ICD-10-CM

## 2022-03-30 DIAGNOSIS — I509 Heart failure, unspecified: Secondary | ICD-10-CM | POA: Diagnosis not present

## 2022-03-30 DIAGNOSIS — I080 Rheumatic disorders of both mitral and aortic valves: Secondary | ICD-10-CM | POA: Insufficient documentation

## 2022-03-30 DIAGNOSIS — J189 Pneumonia, unspecified organism: Secondary | ICD-10-CM | POA: Diagnosis not present

## 2022-03-30 DIAGNOSIS — I4729 Other ventricular tachycardia: Secondary | ICD-10-CM | POA: Diagnosis not present

## 2022-03-30 DIAGNOSIS — R338 Other retention of urine: Secondary | ICD-10-CM | POA: Diagnosis not present

## 2022-03-30 DIAGNOSIS — J449 Chronic obstructive pulmonary disease, unspecified: Secondary | ICD-10-CM | POA: Diagnosis not present

## 2022-03-30 DIAGNOSIS — A419 Sepsis, unspecified organism: Secondary | ICD-10-CM | POA: Diagnosis not present

## 2022-03-30 LAB — BASIC METABOLIC PANEL
Anion gap: 8 (ref 5–15)
BUN: 34 mg/dL — ABNORMAL HIGH (ref 8–23)
CO2: 28 mmol/L (ref 22–32)
Calcium: 7.9 mg/dL — ABNORMAL LOW (ref 8.9–10.3)
Chloride: 104 mmol/L (ref 98–111)
Creatinine, Ser: 1.28 mg/dL — ABNORMAL HIGH (ref 0.44–1.00)
GFR, Estimated: 43 mL/min — ABNORMAL LOW (ref >60)
Glucose, Bld: 150 mg/dL — ABNORMAL HIGH (ref 70–99)
Potassium: 3.8 mmol/L (ref 3.5–5.1)
Sodium: 140 mmol/L (ref 135–145)

## 2022-03-30 LAB — BLOOD GAS, ARTERIAL
Acid-Base Excess: 4.8 mmol/L — ABNORMAL HIGH (ref 0.0–2.0)
Acid-base deficit: 8.3 mmol/L — ABNORMAL HIGH (ref 0.0–2.0)
Bicarbonate: 22.7 mmol/L (ref 20.0–28.0)
Bicarbonate: 29.9 mmol/L — ABNORMAL HIGH (ref 20.0–28.0)
Delivery systems: POSITIVE
Delivery systems: POSITIVE
Expiratory PAP: 5 cmH2O
Expiratory PAP: 5 cmH2O
FIO2: 1 %
FIO2: 100 %
Inspiratory PAP: 10 cmH2O
Inspiratory PAP: 15 cmH2O
O2 Saturation: 99.1 %
O2 Saturation: 99.4 %
Patient temperature: 37
Patient temperature: 37
pCO2 arterial: 73 mmHg (ref 32–48)
pCO2 arterial: 45 mmHg (ref 32–48)
pH, Arterial: 7.1 — CL (ref 7.35–7.45)
pH, Arterial: 7.43 (ref 7.35–7.45)
pO2, Arterial: 127 mmHg — ABNORMAL HIGH (ref 83–108)
pO2, Arterial: 286 mmHg — ABNORMAL HIGH (ref 83–108)

## 2022-03-30 LAB — ECHOCARDIOGRAM COMPLETE
AR max vel: 1.18 cm2
AV Area VTI: 1.19 cm2
AV Area mean vel: 1.17 cm2
AV Mean grad: 20.7 mmHg
AV Peak grad: 36.6 mmHg
Ao pk vel: 3.03 m/s
Area-P 1/2: 2.75 cm2
Height: 59 in
MV VTI: 1.78 cm2
S' Lateral: 3.4 cm
Weight: 2804.25 oz

## 2022-03-30 LAB — PROCALCITONIN: Procalcitonin: 0.8 ng/mL

## 2022-03-30 LAB — CBC
HCT: 28.9 % — ABNORMAL LOW (ref 36.0–46.0)
Hemoglobin: 9.1 g/dL — ABNORMAL LOW (ref 12.0–15.0)
MCH: 28.2 pg (ref 26.0–34.0)
MCHC: 31.5 g/dL (ref 30.0–36.0)
MCV: 89.5 fL (ref 80.0–100.0)
Platelets: 383 10*3/uL (ref 150–400)
RBC: 3.23 MIL/uL — ABNORMAL LOW (ref 3.87–5.11)
RDW: 15.3 % (ref 11.5–15.5)
WBC: 20.8 10*3/uL — ABNORMAL HIGH (ref 4.0–10.5)
nRBC: 0 % (ref 0.0–0.2)

## 2022-03-30 LAB — GLUCOSE, CAPILLARY: Glucose-Capillary: 361 mg/dL — ABNORMAL HIGH (ref 70–99)

## 2022-03-30 LAB — BRAIN NATRIURETIC PEPTIDE: B Natriuretic Peptide: 933.7 pg/mL — ABNORMAL HIGH (ref 0.0–100.0)

## 2022-03-30 LAB — MRSA NEXT GEN BY PCR, NASAL: MRSA by PCR Next Gen: NOT DETECTED

## 2022-03-30 MED ORDER — SODIUM CHLORIDE 0.9 % IV SOLN: INTRAVENOUS | Status: DC | PRN

## 2022-03-30 MED ORDER — BUDESONIDE 0.5 MG/2ML IN SUSP
0.5000 mg | Freq: Two times a day (BID) | RESPIRATORY_TRACT | Status: DC
  Administered 2022-03-30 – 2022-04-08 (×18): 0.5 mg via RESPIRATORY_TRACT
  Filled 2022-03-30 (×18): qty 2

## 2022-03-30 MED ORDER — POTASSIUM CHLORIDE CRYS ER 20 MEQ PO TBCR
40.0000 meq | EXTENDED_RELEASE_TABLET | Freq: Once | ORAL | Status: AC
  Administered 2022-03-30: 40 meq via ORAL
  Filled 2022-03-30: qty 2

## 2022-03-30 MED ORDER — IPRATROPIUM-ALBUTEROL 0.5-2.5 (3) MG/3ML IN SOLN
3.0000 mL | RESPIRATORY_TRACT | Status: DC
  Administered 2022-03-30 – 2022-04-04 (×32): 3 mL via RESPIRATORY_TRACT
  Filled 2022-03-30 (×32): qty 3

## 2022-03-30 MED ORDER — FUROSEMIDE 10 MG/ML IJ SOLN
40.0000 mg | Freq: Once | INTRAMUSCULAR | Status: AC
  Administered 2022-03-30: 40 mg via INTRAVENOUS
  Filled 2022-03-30: qty 4

## 2022-03-30 MED ORDER — CHLORHEXIDINE GLUCONATE CLOTH 2 % EX PADS
6.0000 | MEDICATED_PAD | Freq: Every day | CUTANEOUS | Status: DC
  Administered 2022-03-30 – 2022-03-31 (×2): 6 via TOPICAL

## 2022-03-30 NOTE — Significant Event (Signed)
Rapid Response Event Note   Reason for Call :  Respiratory distress  Initial Focused Assessment:  Patient anxious- stating that she couldn't breathe-  Respiratory Fritz Pickerel- just administered patient her breathing treatment and stated patient had coughed up bloody sputum.       Interventions:  Patient placed on a venturi mask- oxygen sats 91-92%- tachypneic.  Other vitals stable.  Patient sounds fluid overloaded.  Notified Dr. Arbutus Ped.    Plan of Care:  Dr. Arbutus Ped ordered '40mg'$  of lasix IV- chest xray and blood work.  Stated she would come shortly to assess her.     Event Summary:   I told bedside RN and charge RN- Danae Chen to notify me if they need any other assistance. MD Notified: Dr. Arbutus Ped Call Time: 08:25 Arrival Time: End Time:08:30  Reggie Pile, RN

## 2022-03-30 NOTE — TOC Initial Note (Signed)
Transition of Care [facility]) - Initial/Assessment Note    Patient Details  Name: Kerri Carter MRN: [id number] Date of Birth: 07-Jul-1940  [facility]) CM/SW Contact:    Tiburcio Bash, LCSW Phone Number: 03/30/2022, 10:37 AM  Clinical Narrative:                  Readmission risk assessment completed. CSW notes patient is from home with grandson and wife. Upon last admission patietn was active with [facility] and received a Wheel Chair via [place]. PCP Dr. Volanda Napoleon.   Patient was also given referral to [facility] for long term care options per family request.   During current admission patient put on venturi mask following rapid response this morning.   TOC will follow for discharge planning needs, notes PT ordered pending medical stability to work with patient.    Expected Discharge Plan:  (TBD) Barriers to Discharge: Continued Medical Work up   Patient Goals and CMS Choice   CMS Medicare.gov Compare Post Acute Care list provided to:: Patient Represenative (must comment) (daughter)        Expected Discharge Plan and Services                                              Prior Living Arrangements/Services                       Activities of Daily Living Home Assistive Devices/Equipment: Cane (specify quad or straight) ADL Screening (condition at time of admission) Patient's cognitive ability adequate to safely complete daily activities?: Yes Is the patient deaf or have difficulty hearing?: No Does the patient have difficulty seeing, even when wearing glasses/contacts?: No Does the patient have difficulty concentrating, remembering, or making decisions?: No Patient able to express need for assistance with ADLs?: Yes Does the patient have difficulty dressing or bathing?: No Independently performs ADLs?: Yes (appropriate for developmental age) Does the patient have difficulty walking or climbing stairs?: No Weakness of Legs:  Both Weakness of Arms/Hands: None  Permission Sought/Granted                  Emotional Assessment              Admission diagnosis:  COPD exacerbation ([place]) [J44.1] Sepsis due to pneumonia ([place]) [J18.9, A41.9] Multifocal pneumonia [J18.9] Patient Active Problem List   Diagnosis Date Noted   NSVT (nonsustained ventricular tachycardia) ([place]) 03/30/2022   Sepsis due to pneumonia ([place]) 03/28/2022   Demand ischemia 03/28/2022   History of major orthopedic surgery 03/19/2022   Iron deficiency anemia due to chronic blood loss 10/09/2021   Chronic heart failure with preserved ejection fraction ([place]) 09/24/2021   Acute on chronic respiratory failure with hypoxia ([place]) 09/10/2021   Acute on chronic diastolic CHF (congestive heart failure) ([place]) 08/20/2021   Paroxysmal atrial fibrillation ([place]) 07/29/2021   History of lumbar fusion 05/23/2021   Impaired mobility 12/18/2020   Urinary incontinence 12/18/2020   Lumbar radiculopathy 12/11/2020   Chronic pain of both shoulders 08/29/2019   Hypertriglyceridemia 07/24/2019   Bipolar disorder, in full remission, most recent episode depressed ([place]) 07/13/2019   Essential hypertension 06/16/2019   Chronic pain 06/16/2019   Lymphedema 06/05/2019   Chronic venous insufficiency 05/25/2019   PAD (peripheral artery disease) ([place]) 05/25/2019   Interstitial pulmonary disease ([place]) 07/07/2018  Leukocytosis 10/19/2017   Allergic rhinitis 09/28/2017   Age-related osteoporosis without current pathological fracture 11/05/2016   Peripheral neuropathy 10/28/2016   Osteoporosis 08/27/2016   Drug-induced osteoporosis 08/27/2016   Adnexal mass 08/25/2016   Prediabetes 08/25/2016   Coronary atherosclerosis 08/25/2016   Stage 3b chronic kidney disease ([place])    Hyperlipidemia 07/17/2015   Macular degeneration 07/17/2015   Rheumatoid arthritis ([place]) 12/05/2014   Osteoarthritis of knee 06/07/2013   Valvular heart disease 10/13/2011   COPD with  acute exacerbation ([place]) 09/09/2011   OSA (obstructive sleep apnea) 09/09/2011   Gastroesophageal reflux disease 02/24/2011   Single kidney 02/24/2011   Renal artery stenosis ([place]) 02/24/2011   PCP:  Carollee Leitz, MD Pharmacy:   [id number] [address]: [phone]: [phone] [id number] [id number]: [phone]: [phone] [id number]: [phone]: [phone]   Social Determinants of Health (SDOH) Social History: SDOH Screenings   Food Insecurity: No Food Insecurity (03/28/2022)  Housing: Low Risk  (03/28/2022)  Transportation Needs: No Transportation Needs (03/28/2022)  Utilities: Not At Risk (03/28/2022)  Depression (PHQ2-9): Low Risk  (01/06/2022)  Financial Resource Strain: Low Risk  (06/18/2021)  Physical Activity: Inactive (11/24/2019)  Social Connections: Moderately Integrated (06/18/2021)  Stress: No Stress Concern Present (06/18/2021)  Tobacco Use: Medium Risk (03/28/2022)   SDOH Interventions: Housing Interventions: Intervention Not Indicated   Readmission Risk Interventions    08/21/2021    3:19 PM 05/24/2021    2:35 PM  Readmission Risk Prevention Plan  Transportation Screening Complete Complete  PCP or Specialist Appt within 3-5 Days  Complete  HRI or HoNipomoComplete  Social Work Consult for Re[place]/Counseling  Complete  Palliative Care Screening  Not Applicable  Medication Review (RPress photographerComplete Complete  HRI or HoSandyvilleomplete   SW Recovery Care/Counseling Consult Complete   Palliative Care Screening Not ApCocoaot Applicable

## 2022-03-30 NOTE — Progress Notes (Signed)
PT Cancellation Note  Patient Details Name: Kerri Carter MRN: [id number] DOB: 05/31/1940   Cancelled Treatment:    Reason Eval/Treat Not Completed: Medical issues which prohibited therapy.  PT consult received.  Chart reviewed.  Rapid response called this morning and pt transferred to ICU.  D/t pt transferring to higher level of care, per PT protocol require new PT consult in order to continue therapy (will discontinue current PT order d/t this).  Please re-consult PT when pt is medically appropriate to participate in PT.  Leitha Bleak, PT 03/30/22, 10:29 AM

## 2022-03-30 NOTE — Progress Notes (Signed)
Rapid response paged [phone] for patient having SOB and anxious, labored breathing and O2 sats 87% on 4L. Venturi mask applied by Fritz Pickerel, RT. Rapid response at bedside, orders received from Dr. Arbutus Ped. This RN administered lasix at [phone], patient remains anxious but alert and oriented, remains on venturi mask. This RN returned to place patient on continuous pulse ox and O2 sat read 39%, dinamap obtained to verify as patient was still alert and O2 reading 43%. Rapid response paged again [phone]), charge nurse Danae Chen RN made aware and at bedside. Dr. Arbutus Ped paged again and at bedside. Patient transferred to higher level of care.

## 2022-03-30 NOTE — Progress Notes (Signed)
OT Cancellation Note  Patient Details Name: DEMESHIA YOKE MRN: [id number] DOB: 1940/10/08   Cancelled Treatment:    Reason Eval/Treat Not Completed: Other (comment);Medical issues which prohibited therapy (Pt transfer to ICU. Will d/c. Re-order therapy when pt appropriate.)  Vania Rea 03/30/2022, 11:19 AM

## 2022-03-30 NOTE — Progress Notes (Addendum)
Progress Note   Patient: Kerri Carter [id number] DOB: 06/05/1940 DOA: 03/28/2022     2 DOS: the patient was seen and examined on 03/30/2022   Brief hospital course: Kerri Carter is a 82 y.o. female with medical history significant of chronic respiratory failure with hypoxia on 3 L/min O2 at baseline, COPD on chronic prednisone, OSA, interstitial lung disease, chronic diastolic CHF, hypertension, paroxysmal A-fib, valvular heart disease, PAD, chronic venous insufficiency, renal artery stenosis, GERD, RA, peripheral neuropathy, lumbar radiculopathy, CKD stage IIIb, chronic leukocytosis, bipolar disorder, chronic pain with lumbar radiculopathy.  She presented to the ED on the evening of 03/27/2022 for evaluation of worsening shortness of breath and in respiratory distress.     She was found to have acute on chronic hypoxic respiratory failure due to multifocal pneumonia and secondary COPD exacerbation.  Started on IV antibiotics and IV steroids and admitted to the hospital.  2/25: remains on 4 L/min O2 with sats 91-92%. Ongoing dyspnea at rest and with conversation.  2/26: rapid response this AM, increased O2 needs, pt unresponsive with increased WOB.  Placed on BiPAP, transferred to stepdown.  ABG with pCO2 73, pH 7.1.  Pulmonology consulted.   Assessment and Plan: * Sepsis due to pneumonia ([place]) Severe sepsis present on admission with leukocytosis, tachycardia in the setting of multifocal pneumonia, elevated lactic acid at 2.2 and increased oxygen requirement consistent with acute on chronic respiratory failure indicating organ dysfunction and severe sepsis. --Continue IV Unasyn, Zithromax --Management of COPD exacerbation as outlined --Pulmonary hygiene --O2 per protocol --Collect sputum culture --Mucolytic's  NSVT (nonsustained ventricular tachycardia) (HCC) Central tele reported 11 beats VT this AM, around time of rapid response events. --Maintain on telemetry --Replace K and Mg for  goal K>4, Mg>2 --Pt is DNR -- no code if sustained pulseless VT  Acute on chronic respiratory failure with hypoxia (HCC) Acute respiratory failure with hypercapnea Baseline O2 requirement is 3 L/min, due to baseline COPD and ILD.  Presented with severe dyspnea in setting of multifocal PNA and secondary COPD exacerbation.   On admission, requiring 4 L/min O2. 2/26 - acute worsened respiratory status, started on BiPAP, pH 7.1 and pCO2 73. --Supplement O2 for goal sats 88-94% --Mgmt of underlying issues as outlined --Incentive spirometer & flutter --Consult pulmonology --BiPAP as needed, follow ABG's  COPD with acute exacerbation ([place]) Secondary to multifocal pneumonia.  Patient presented with severe dyspnea and diffuse wheezing on exam.  On chronic prednisone. Now requiring BiPAP in stepdown unit --Consult pulmonology -- Continue IV Solu-Medrol 40 mg IV twice daily --IV Lasix 40 mg x 1 this AM, further diuresis PRN --Scheduled DuoNebs, as needed albuterol nebs --Pulmicort nebs 3 times daily --Mucinex --O2 per protocol --Pulmonary hygiene  Demand ischemia Mild troponin elevation on admission, patient had no chest pain.  Trop trend 38 >> 122 >> 56. Due to sepsis and respiratory failure, not consistent with ACS.  Chronic heart failure with preserved ejection fraction (HCC) Last echo in June 2023 with Normal EF 0000000, grade 1 diastolic function, moderate to severe MR, moderate AR, mild to moderate AS.   Appears compensated on admission. --2/26: IV Lasix 40 mg x 1, hold torsemide --Further IV diuresis based on response --Continue home Bystolic --Daily weights to monitor volume status.  Paroxysmal atrial fibrillation (HCC) Heart rates overall controlled, mildly elevated in the setting of sepsis. --Continue Eliquis, Bystolic --Telemetry  Interstitial pulmonary disease ([place]) Follows with pulmonology, Dr. Patsey Berthold -  consulted due to worsening clinical course. Currently complicated  by multifocal pneumonia and COPD exacerbation.  Stage 3b chronic kidney disease ([place]) Renal function appears near baseline.  Monitor BMP daily  Essential hypertension Continued on home torsemide, Bystolic, amlodipine  History of lumbar fusion No apparent acute issues.  Monitor.  Rheumatoid arthritis ([place]) Continue home leflunomide  Gastroesophageal reflux disease Continue PPI  Hyperlipidemia Continue pravastatin  Iron deficiency anemia due to chronic blood loss Continue home iron supplement. Monitor CBC.  Urinary incontinence Continue Myrbetriq  Lumbar radiculopathy Continue home gabapentin. No apparent acute issues.  Monitor. Pain control as needed.  Chronic pain .  Bipolar disorder, in full remission, most recent episode depressed ([place]) Appears stable. Continue home Lamictal, Seroquel, Lexapro, BuSpar  PAD (peripheral artery disease) (HCC) Continue Eliquis, statin  Chronic venous insufficiency .  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   Valvular heart disease Last echo in June 2023 with moderate to severe MR, moderate AR, mild to moderate AS.  Normal EF 0000000, grade 1 diastolic function  OSA (obstructive sleep apnea) CPAP ordered        Subjective: Pt seen during rapid response event this AM.  Pt had increasing respiratory distress and later became unresponsive.  Blood gas showing respiratory acidosis with CO2 retention.  Started on BiPAP and transferred to stepdown unit.  Son updated by phone.  Given IV Lasix.  Pt unable to respond to verbal or tactile stimulus during time of encounter.      Physical Exam: Vitals:   03/30/22 0501 03/30/22 0808 03/30/22 0830 03/30/22 0915  BP: 131/68 131/77    Pulse: 73 89  88  Resp: 18 18  (!) 26  Temp: (!) 97.5 F (36.4 C) 98.3 F (36.8 C)    TempSrc: Oral Oral    SpO2: 97% 92% 92% 92%  Weight:      Height:       General exam:  unresponsive to verbal and sternal rub, chronically ill-appearing HEENT: BiPAP mask in place, eyes closed, moist mucus membranes, hearing grossly normal  Respiratory system: diffuse wheezing and rhonchi, using accessory muscles, on BiPAP, diminished bases Cardiovascular system: normal S1/S2, RRR, no pedal edema, 2+ DP pulses.   Gastrointestinal system: soft, NT, ND, no HSM felt, +bowel sounds. Central nervous system: unable to examined due to encephalopathy, unresponsive Extremities: moves all, no edema, normal tone Skin: dry, intact, normal temperature Psychiatry: unable to examined due to encephalopathy, unresponsive   Data Reviewed:  Notable labs --- BMP notable for glucose 150, BUN 34, Cr 1.28 stable, Ca 7.9, CBC with WBC 20.8 from 19.9k, otherwise normal. Procal 0.80.  BNP 933.7.  ABG pH 7.1, pCO2 73, pO2 127, bicarb 22.7      CXR this AM--- worsening bilateral opacities, consistent with edema vs worsening PNA vs increasing pleural fluid  Family Communication: Updated son by phone this AM, after rapid response. He confirms DNR/DNI, as per the patient's advanced directive. Updated on rapid response events, BiPAP, stepdown unit & pending further evaluation. Will attempt to update again this afternoon.  Disposition: Status is: Inpatient Remains inpatient appropriate because: severity and worsening of acute illness as above, remains on IV therapies, started on BiPAP >> stepdown this AM   Planned Discharge Destination: Home    Time spent: 55 minutes  Author: Ezekiel Slocumb, DO 03/30/2022 12:06 PM  For on call review [email or web address].

## 2022-03-30 NOTE — Progress Notes (Signed)
*  PRELIMINARY RESULTS* Echocardiogram 2D Echocardiogram has been performed.  Sherrie Sport 03/30/2022, 1:52 PM

## 2022-03-30 NOTE — Progress Notes (Signed)
Patient taken off BIPAP and being trailed on  3lpm [place] at this time.  Will continue to monitor.

## 2022-03-30 NOTE — Progress Notes (Signed)
Responded to rapid response. Pt having respiratory distress. Not responsive to painful stimuli. RRT team present.

## 2022-03-30 NOTE — Inpatient Diabetes Management (Signed)
Inpatient Diabetes Program Recommendations  AACE/ADA: New Consensus Statement on Inpatient Glycemic Control (2015)  Target Ranges:  Prepandial:   less than 140 mg/dL      Peak postprandial:   less than 180 mg/dL (1-2 hours)      Critically ill patients:  140 - 180 mg/dL   Lab Results  Component Value Date   GLUCAP 361 (H) 03/30/2022   HGBA1C 6.0 (H) 05/28/2021    Review of Glycemic Control  Latest Reference Range & Units 03/30/22 09:28  Glucose-Capillary 70 - 99 mg/dL 361 (H)  (H): Data is abnormally high Diabetes history: PreDM Outpatient Diabetes medications: none Current orders for Inpatient glycemic control: none Solumedrol 40 mg BID  Inpatient Diabetes Program Recommendations:    Consider adding Novolog 0-9 units TID & HS  Thanks, Bronson Curb, MSN, RNC-OB Diabetes Coordinator [phone] (8a-5p)

## 2022-03-30 NOTE — Significant Event (Signed)
Rapid Response Event Note   Reason for Call :   Patient now experiencing AMS- increase work of breathing.  Initial Focused Assessment:       Interventions:  Patient placed on non-re breather and then placed on bipap. Dr. Arbutus Ped notified and at bedside.   Plan of Care:  ABG received and Dr. Arbutus Ped updated the patient's son.  Patient then transferred to ICU room 1.     Event Summary:   MD Notified: Dr. Arbutus Ped Call Time:09:00 Arrival Time:09:05 End Time:09:20  Reggie Pile, RN

## 2022-03-30 NOTE — Assessment & Plan Note (Signed)
Foley placed 2/26 after no urine output after IV Lasix.  Bladder scan showed > 800 cc. --Maintain Foley for now --Voiding trial once she's clinically improved

## 2022-03-30 NOTE — Assessment & Plan Note (Signed)
Central tele reported 11 beats VT this AM, around time of rapid response events. --Maintain on telemetry --Replace K and Mg for goal K>4, Mg>2 --Pt is DNR -- no code if sustained pulseless VT

## 2022-03-30 NOTE — Plan of Care (Signed)
Patient transferred to ICU for acute hypercapnic resp failure End stage COPD DNR/DNI status  acute on chronic hypoxic respiratory failure due to multifocal pneumonia and secondary COPD exacerbation.    Patient placed on bIPAP with pH 7.10/73  Repeat ABG 7.43/45  BP 129/66   Pulse 88   Temp 98.3 F (36.8 C) (Oral)   Resp 18   Ht '4\' 11"'$  (1.499 m)   Wt 79.5 kg   SpO2 100%   BMI 35.40 kg/m   Patient weaned off BIPAP Fio2 as needed Continue IV ABX Continue IV steroids BD therapy  No Further recs, recommend palliative care consultation    Corrin Parker, M.D.  [facility]  Medical Director [place] Director [place] Department

## 2022-03-31 ENCOUNTER — Ambulatory Visit: Payer: Medicare PPO | Admitting: Gastroenterology

## 2022-03-31 DIAGNOSIS — E669 Obesity, unspecified: Secondary | ICD-10-CM | POA: Diagnosis present

## 2022-03-31 DIAGNOSIS — J189 Pneumonia, unspecified organism: Secondary | ICD-10-CM | POA: Diagnosis not present

## 2022-03-31 DIAGNOSIS — Z7189 Other specified counseling: Secondary | ICD-10-CM | POA: Diagnosis not present

## 2022-03-31 DIAGNOSIS — A419 Sepsis, unspecified organism: Secondary | ICD-10-CM | POA: Diagnosis not present

## 2022-03-31 LAB — BASIC METABOLIC PANEL
Anion gap: 11 (ref 5–15)
BUN: 34 mg/dL — ABNORMAL HIGH (ref 8–23)
CO2: 27 mmol/L (ref 22–32)
Calcium: 7.3 mg/dL — ABNORMAL LOW (ref 8.9–10.3)
Chloride: 103 mmol/L (ref 98–111)
Creatinine, Ser: 1.37 mg/dL — ABNORMAL HIGH (ref 0.44–1.00)
GFR, Estimated: 39 mL/min — ABNORMAL LOW (ref >60)
Glucose, Bld: 193 mg/dL — ABNORMAL HIGH (ref 70–99)
Potassium: 3.9 mmol/L (ref 3.5–5.1)
Sodium: 141 mmol/L (ref 135–145)

## 2022-03-31 LAB — CBC
HCT: 29.6 % — ABNORMAL LOW (ref 36.0–46.0)
Hemoglobin: 9.1 g/dL — ABNORMAL LOW (ref 12.0–15.0)
MCH: 27.7 pg (ref 26.0–34.0)
MCHC: 30.7 g/dL (ref 30.0–36.0)
MCV: 90 fL (ref 80.0–100.0)
Platelets: 367 10*3/uL (ref 150–400)
RBC: 3.29 MIL/uL — ABNORMAL LOW (ref 3.87–5.11)
RDW: 15.3 % (ref 11.5–15.5)
WBC: 15.7 10*3/uL — ABNORMAL HIGH (ref 4.0–10.5)
nRBC: 0 % (ref 0.0–0.2)

## 2022-03-31 LAB — HEMOGLOBIN A1C
Hgb A1c MFr Bld: 6.1 % — ABNORMAL HIGH (ref 4.8–5.6)
Mean Plasma Glucose: 128 mg/dL

## 2022-03-31 LAB — MAGNESIUM: Magnesium: 2.3 mg/dL (ref 1.7–2.4)

## 2022-03-31 LAB — GLUCOSE, CAPILLARY: Glucose-Capillary: 202 mg/dL — ABNORMAL HIGH (ref 70–99)

## 2022-03-31 MED ORDER — FUROSEMIDE 10 MG/ML IJ SOLN
40.0000 mg | Freq: Once | INTRAMUSCULAR | Status: AC
  Administered 2022-03-31: 40 mg via INTRAVENOUS

## 2022-03-31 MED ORDER — TORSEMIDE 20 MG PO TABS
20.0000 mg | ORAL_TABLET | Freq: Every day | ORAL | Status: DC
  Administered 2022-04-01: 20 mg via ORAL
  Filled 2022-03-31 (×2): qty 1

## 2022-03-31 MED ORDER — INSULIN ASPART 100 UNIT/ML IJ SOLN
0.0000 [IU] | Freq: Every day | INTRAMUSCULAR | Status: DC
  Administered 2022-04-01: 3 [IU] via SUBCUTANEOUS
  Administered 2022-04-03: 2 [IU] via SUBCUTANEOUS
  Administered 2022-04-04: 3 [IU] via SUBCUTANEOUS
  Administered 2022-04-05: 2 [IU] via SUBCUTANEOUS
  Filled 2022-03-31 (×5): qty 1

## 2022-03-31 MED ORDER — MORPHINE SULFATE (PF) 2 MG/ML IV SOLN
1.0000 mg | INTRAVENOUS | Status: DC | PRN
  Administered 2022-03-31 – 2022-04-01 (×4): 1 mg via INTRAVENOUS
  Filled 2022-03-31 (×4): qty 1

## 2022-03-31 MED ORDER — INSULIN ASPART 100 UNIT/ML IJ SOLN
0.0000 [IU] | Freq: Three times a day (TID) | INTRAMUSCULAR | Status: DC
  Administered 2022-03-31 (×2): 3 [IU] via SUBCUTANEOUS
  Administered 2022-04-01: 2 [IU] via SUBCUTANEOUS
  Administered 2022-04-01: 5 [IU] via SUBCUTANEOUS
  Administered 2022-04-01: 2 [IU] via SUBCUTANEOUS
  Administered 2022-04-02 – 2022-04-03 (×4): 3 [IU] via SUBCUTANEOUS
  Administered 2022-04-03: 1 [IU] via SUBCUTANEOUS
  Administered 2022-04-04: 9 [IU] via SUBCUTANEOUS
  Administered 2022-04-04: 3 [IU] via SUBCUTANEOUS
  Administered 2022-04-04: 1 [IU] via SUBCUTANEOUS
  Administered 2022-04-05: 2 [IU] via SUBCUTANEOUS
  Administered 2022-04-05: 1 [IU] via SUBCUTANEOUS
  Administered 2022-04-05: 5 [IU] via SUBCUTANEOUS
  Administered 2022-04-06: 1 [IU] via SUBCUTANEOUS
  Administered 2022-04-06: 5 [IU] via SUBCUTANEOUS
  Administered 2022-04-06: 3 [IU] via SUBCUTANEOUS
  Administered 2022-04-07 (×3): 2 [IU] via SUBCUTANEOUS
  Administered 2022-04-08: 1 [IU] via SUBCUTANEOUS
  Filled 2022-03-31 (×23): qty 1

## 2022-03-31 MED ORDER — FUROSEMIDE 10 MG/ML IJ SOLN
INTRAMUSCULAR | Status: AC
  Filled 2022-03-31: qty 4

## 2022-03-31 NOTE — Progress Notes (Addendum)
Progress Note   Patient: Kerri Carter [id number] DOB: 1940/03/01 DOA: 03/28/2022     3 DOS: the patient was seen and examined on 03/31/2022   Brief hospital course: Kerri Carter is a 82 y.o. female with medical history significant of chronic respiratory failure with hypoxia on 3 L/min O2 at baseline, COPD on chronic prednisone, OSA, interstitial lung disease, chronic diastolic CHF, hypertension, paroxysmal A-fib, valvular heart disease, PAD, chronic venous insufficiency, renal artery stenosis, GERD, RA, peripheral neuropathy, lumbar radiculopathy, CKD stage IIIb, chronic leukocytosis, bipolar disorder, chronic pain with lumbar radiculopathy.  She presented to the ED on the evening of 03/27/2022 for evaluation of worsening shortness of breath and in respiratory distress.     She was found to have acute on chronic hypoxic respiratory failure due to multifocal pneumonia and secondary COPD exacerbation.  Started on IV antibiotics and IV steroids and admitted to the hospital.  2/25: remains on 4 L/min O2 with sats 91-92%. Ongoing dyspnea at rest and with conversation.  2/26: rapid response this AM, increased O2 needs, pt unresponsive with increased WOB.  Placed on BiPAP, transferred to stepdown.  ABG with pCO2 73, pH 7.1.  Pulmonology consulted.   Assessment and Plan: * Sepsis due to pneumonia ([place]) Severe sepsis present on admission with leukocytosis, tachycardia in the setting of multifocal pneumonia, elevated lactic acid at 2.2 and increased oxygen requirement consistent with acute on chronic respiratory failure indicating organ dysfunction and severe sepsis. --Continue IV Unasyn, Zithromax --Management of COPD exacerbation as outlined --Pulmonary hygiene --O2 per protocol --Collect sputum culture --Mucolytic's  NSVT (nonsustained ventricular tachycardia) (HCC) Central tele reported 11 beats VT this AM, around time of rapid response events. --Maintain on telemetry --Replace K and Mg for  goal K>4, Mg>2 --Pt is DNR -- no code if sustained pulseless VT  Acute on chronic respiratory failure with hypoxia and hypercapnia (HCC) Acute respiratory failure with hypercapnea Baseline O2 requirement is 3 L/min, due to baseline COPD and ILD.  Presented with severe dyspnea in setting of multifocal PNA and secondary COPD exacerbation.   On admission, requiring 4 L/min O2. 2/26 - acute worsened respiratory status, started on BiPAP, pH 7.1 and pCO2 73. --Supplement O2 for goal sats 88-94% --Mgmt of underlying issues as outlined --Incentive spirometer & flutter --Consult pulmonology --BiPAP as needed, follow ABG's --TOC consult for Trilogy/NIV at d/c  COPD with acute exacerbation ([place]) Secondary to multifocal pneumonia.  Patient presented with severe dyspnea and diffuse wheezing on exam.  On chronic prednisone. Now requiring BiPAP in stepdown unit --Consult pulmonology -- Continue IV Solu-Medrol 40 mg IV twice daily --IV Lasix 40 mg x 1 2/26, resumed home torsemide 2/27 --Scheduled DuoNebs, as needed albuterol nebs --Pulmicort nebs 3 times daily --Mucinex --O2 per protocol --Pulmonary hygiene  Demand ischemia Myocardial injury - Mild troponin elevation on admission, patient had no chest pain.  Trop trend 38 >> 122 >> 56. Due to sepsis and respiratory failure, not consistent with ACS.  Chronic heart failure with preserved ejection fraction (HCC) Last echo in June 2023 with Normal EF 0000000, grade 1 diastolic function, moderate to severe MR, moderate AR, mild to moderate AS.   Appears compensated on admission. --2/26: IV Lasix 40 mg x 1, hold torsemide --Further IV diuresis based on response --Resume home torsemide --Continue home Bystolic --Daily weights to monitor volume status.  Paroxysmal atrial fibrillation (HCC) Heart rates overall controlled, mildly elevated in the setting of sepsis. --Continue Eliquis, Bystolic --Telemetry  Interstitial pulmonary disease  ([place])  Follows with pulmonology, Dr. Patsey Berthold -  consulted due to worsening clinical course. Currently complicated by multifocal pneumonia and COPD exacerbation.  Stage 3b chronic kidney disease ([place]) Renal function appears near baseline.  Monitor BMP daily  Essential hypertension Continued on home torsemide, Bystolic, amlodipine  History of lumbar fusion No apparent acute issues.  Monitor.  Rheumatoid arthritis (HCC) Hold leflunomide with acute infection  Gastroesophageal reflux disease Continue PPI  Hyperlipidemia Continue pravastatin  Obesity (BMI 30-39.9) Body mass index is 37.22 kg/m. Complicates overall care and prognosis.  Recommend lifestyle modifications including physical activity and diet for weight loss and overall long-term health.   Acute urinary retention Foley placed 2/26 after no urine output after IV Lasix.  Bladder scan showed > 800 cc. --Maintain Foley for now --Voiding trial once she's clinically improved  Iron deficiency anemia due to chronic blood loss Continue home iron supplement. Monitor CBC.  Urinary incontinence Continue Myrbetriq  Lumbar radiculopathy Continue home gabapentin. No apparent acute issues.  Monitor. Pain control as needed.  Chronic pain .  Bipolar disorder, in full remission, most recent episode depressed ([place]) Appears stable. Continue home Lamictal, Seroquel, Lexapro, BuSpar  PAD (peripheral artery disease) (HCC) Continue Eliquis, statin  Chronic venous insufficiency .  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   Valvular heart disease Last echo in June 2023 with moderate to severe MR, moderate AR, mild to moderate AS.  Normal EF 0000000, grade 1 diastolic function  OSA (obstructive sleep apnea) CPAP ordered        Subjective: Pt seen in stepdown with son at bedside this AM.  Pt reports feeling better but remains very short of breath.   She admits to baseline dyspnea on minimal exertion, but currently much worse and dyspneic even at rest, cannot speak in full sentences.  Wore BiPAP overnight.     Physical Exam: Vitals:   03/31/22 0713 03/31/22 0800 03/31/22 1200 03/31/22 1300  BP:  127/68 127/64   Pulse: 73 78 76 83  Resp: 18 17 (!) 24 (!) 22  Temp:  98.5 F (36.9 C) 98.3 F (36.8 C)   TempSrc:  Oral Oral   SpO2: 95% 96% 96% (!) 89%  Weight:      Height:       General exam: alert, awake, no acute distress, chronically ill-appearing HEENT: HF NCin place, moist mucus membranes, hearing grossly normal  Respiratory system: lung sounds improved, mildly wheezing, no coarse rhonchi, still using accessory muscles and has signifcant conversational dypsnea, on 8 L/min HFNC O2 Cardiovascular system: normal S1/S2, RRR, no pedal edema, 2+ DP pulses.   Gastrointestinal system: soft, NT, ND, no HSM felt, +bowel sounds. Central nervous system: normal speech, A&Ox2, grossly non-focal exam Extremities: moves all, no edema, normal tone Skin: dry, intact, normal temperature Psychiatry: normal mood and affect, judgment and insight appear intact   Data Reviewed:  Notable labs --- BMP notable for glucose 193, BUN 34, Cr 1.37 from 1.28, Ca 7.3, WBC 15.7k from 20.8k       ECHO 2/26 --- EF 55-60%, grade II diastolic dysfunction, no WMA's, mild-mod MR, mild-mod AR, mild-mod AS   CXR 2/26--- worsening bilateral opacities, consistent with edema vs worsening PNA vs increasing pleural fluid    Family Communication: Son was at bedside on rounds this AM  2/26: Updated son by phone this AM, after rapid response.  He confirms DNR/DNI, as per the patient's advanced directive.  Updated on rapid response events,  BiPAP, stepdown unit & pending further evaluation.     Disposition: Status is: Inpatient Remains inpatient appropriate because: severity and worsening of acute illness as above, remains on IV therapies, requiring acute BiPAP    Planned Discharge Destination: Home    Time spent: 55 minutes  Author: Ezekiel Slocumb, DO 03/31/2022 4:14 PM  For on call review [email or web address].

## 2022-03-31 NOTE — Assessment & Plan Note (Signed)
Body mass index is 37.22 kg/m. Complicates overall care and prognosis.  Recommend lifestyle modifications including physical activity and diet for weight loss and overall long-term health.

## 2022-03-31 NOTE — Consult Note (Signed)
Consultation Note Date: 03/31/2022   Patient Name: Kerri Carter  DOB: [birth year]  MRN: [id number]  Age / Sex: 82 y.o., female  PCP: Carollee Leitz, MD Referring Physician: Ezekiel Slocumb, DO  Reason for Consultation: Establishing goals of care  HPI/Patient Profile:Kerri Carter is a 82 y.o. female with medical history significant of chronic respiratory failure with hypoxia on 3 L/min O2 at baseline, COPD on chronic prednisone, OSA, interstitial lung disease, chronic diastolic CHF, hypertension, paroxysmal A-fib, valvular heart disease, PAD, chronic venous insufficiency, renal artery stenosis, GERD, RA, peripheral neuropathy, lumbar radiculopathy, CKD stage IIIb, chronic leukocytosis, bipolar disorder, chronic pain with lumbar radiculopathy.  She presented to the ED on the evening of 03/27/2022 for evaluation of worsening shortness of breath and in respiratory distress.     Clinical Assessment and Goals of Care: Notes and labs reviewed. In to see patient, son Kerri Carter is at bedside.  Patient states Kerri Carter  is Kerri Carter who is listed as her primary HPOA. They advise Kerri Carter's son Kerri Carter is secondary HPOA.   Patient lives in home with grandson Kerri Carter and his family. Kerri Carter and his wife work during the day. Per son, she was approved to have a daily person to come in, but she is on a waiting list, so this has need begun.   She states at baseline, she uses 3lmp of O2. She uses a Corporate investment banker. She states she has an E cylinder of O2 but would like an over the shoulder system as it is hard to walk and pull the tank. She states she walks around 45 ft and is completely out of breath.    We discussed her diagnoses, prognosis, and GOC  Created space and opportunity for patient  to explore thoughts and feelings regarding current medical information.   A detailed discussion was had today regarding advanced directives.   Concepts specific to code status, artifical feeding and hydration, IV antibiotics and rehospitalization were discussed.  The difference between an aggressive medical intervention path and a comfort care path was discussed.  Values and goals of care important to patient and family were attempted to be elicited.  Discussed limitations of medical interventions to prolong quality of life in some situations and discussed the concept of human mortality.  She has been kept well updated and is able to discuss her health issues. She discusses her frequent PNA. She discusses her GERD, and hiatal hernia. We discussed her GERD meds, and that she has been advised from a pulmonary/anesthesia standpoint, she is not a candidate for hernia surgery. She discusses food and liquids "coming back up".  She understands how reflux and hiatal hernia can affect her lungs and pulmonary status.   She states currently her QOL is acceptable at baseline. She states every rehab admission has been helpful, and she is hopeful to return. We discuss time for outcomes and further discussion. She confirms DNR/DNI status.     SUMMARY OF RECOMMENDATIONS   DNR/DNI. PMT will follow.    Prognosis:  Poor.  Primary Diagnoses: Present on Admission:  Sepsis due to pneumonia ([place])  Chronic heart failure with preserved ejection fraction (HCC)  Bipolar disorder, in full remission, most recent episode depressed (HCC)  Chronic venous insufficiency  Chronic pain  COPD with acute exacerbation (HCC)  Essential hypertension  Gastroesophageal reflux disease  Hyperlipidemia  Iron deficiency anemia due to chronic blood loss  Interstitial pulmonary disease (HCC)  Lumbar radiculopathy  OSA (obstructive sleep apnea)  PAD (peripheral artery disease) (HCC)  Paroxysmal atrial fibrillation (HCC)  Rheumatoid arthritis (HCC)  Stage 3b chronic kidney disease ([place])  Valvular heart disease  Acute on chronic respiratory failure with hypoxia  ([place])  Demand ischemia  Urinary incontinence   I have reviewed the medical record, interviewed the patient and family, and examined the patient. The following aspects are pertinent.  Past Medical History:  Diagnosis Date   Acromial process of scapula fracture 12/21/2019   Acute kidney injury superimposed on chronic kidney disease ([place]) 05/26/2021   Acute on chronic combined systolic (congestive) and diastolic (congestive) heart failure ([place]) 07/28/2021   Acute on chronic combined systolic and diastolic CHF (congestive heart failure) ([place]) 05/26/2021   Acute on chronic respiratory failure with hypoxia ([place]) 09/10/2021   Acute pain of left knee 12/18/2020   Anemia in stage 3a chronic kidney disease ([place]) 10/09/2021   Anxiety    Anxiety 01/20/2019   Anxiety and depression 05/26/2021   Aortic stenosis 07/09/2015   a.) TTE 07/06/2015: EF 55-60%; mild AS with MPG 13.0 mmHg.   Arrhythmia    atrial fibrillation   Assistance needed for mobility 12/18/2020   Asthma    At high risk for falls 03/16/2019   Avascular necrosis of left shoulder due to adverse effect of steroid therapy ([place]) 01/20/2018   Bipolar disorder ([place])    C. difficile diarrhea 2010   a.) following ABX course during hospital admission   Carotid atherosclerosis, bilateral    a.) Moderate; < 50% stenosis BILATERAL ICAs.   Cataract    a.) s/p BILATERAL extraction   CHF (congestive heart failure) (HCC)    Chicken pox    Chronic left shoulder pain 08/29/2019   Chronic respiratory failure with hypoxia ([place]) 10/01/2021   CKD (chronic kidney disease), stage III (HCC)    a. s/p R nephrectomy./ aneurysm   Closed fracture of acromial process of scapula 10/28/2018   Closed nondisplaced fracture of proximal phalanx of lesser toe of right foot    Collapsed vertebra, not elsewhere classified, lumbar region, initial encounter for fracture ([place]) 02/21/2019   Community acquired pneumonia [id number]   Complicated grief [id number]    Compression fracture of L4 vertebra ([place]) 02/21/2019   Conversion disorder    Conversion disorder 08/04/2015   COPD (chronic obstructive pulmonary disease) ([place])    COPD with exacerbation ([place]) 08/20/2021   Cough 05/02/2012   Depression    Dislocation of hip joint prosthesis ([place]) 01/08/2017   Elevated brain natriuretic peptide (BNP) level 12/18/2020   Elevated d-dimer 05/26/2021   Episodic mood disorder ([place]) 04/18/2019   Essential hypertension    Fatigue 07/07/2018   Frequent falls 12/18/2020   GERD (gastroesophageal reflux disease)    H/O cardiac catheterization 02/24/2011   Formatting of this note might be different from the original.  Repeated at [facility] 2/13 and insignificant disease   Hav (hallux abducto valgus), unspecified laterality 12/05/2018   Heart murmur    Hip fracture ([place]) 10/31/2016   Hip pain 12/18/2020   History of fracture of left hip 01/15/2017  History of syncope 08/25/2016   Hyperkalemia 05/26/2021   - The patient was given p.o. Lokelma and IV Lasix.  - We will follow potassium level.   Hyperlipidemia    Hypokalemia 08/25/2016   ILD (interstitial lung disease) (HCC)    mild; 2/2 RA diagnosis   Iliopsoas bursitis of right hip 08/29/2019   Inflammatory arthritis    a. hands/carpal tunnel.  b. Low titer rheumatoid factor. c. Negative anti-CCP antibodies. d. Plaquenil.   Insomnia 07/17/2015   Left foot pain 10/19/2016   Left lower lobe pneumonia 05/26/2021   Leg edema 05/02/2019   Leg pain, bilateral    Leg swelling 12/15/2016   Leukocytosis 10/19/2017   Leukopenia 06/24/2021   Lymphocytosis 07/13/2021   Macular degeneration    Microcytic anemia 08/15/2015   Mixed bipolar I disorder ([place]) 10/09/2015   Nocturnal hypoxemia    Nocturnal hypoxemia 09/09/2011   June 2013 overnight oximetry on 6 centimeters of water CPAP: SpO2 less than 88% 72% of the time   Non-Obstructive CAD    a. 07/2009 Cath ([facility]): nonobs dzs;  b. 03/2011 Cath [facility]): nonobs  dzs.   NSIP (nonspecific interstitial pneumonitis) ([place]) 12/08/2019   Osteoarthritis    a. Knees.   Other shoulder lesions, left shoulder 12/20/2017   Other specified postprocedural states 07/20/2013   Overweight (BMI 25.0-29.9) 08/29/2019   PAD (peripheral artery disease) (HCC)    Pain due to onychomycosis of toenails of both feet 12/05/2018   Pelvic mass in female 03/16/2019   02/21/19 CT pelvis w/o contrast  Cystic left adnexal lesion has enlarged since 2018 where it  measured approximately 1.8 x 1.7 cm. This is not well assessed due  to streak artifact as well as lack of contrast. Ultrasound follow-up  may be helpful in 8-12 weeks      04/05/19 TVUS     IMPRESSION:  Simple appearing cystic structure within the left ovary measuring up  to 2.9 cm. While this has enlarged s   Physical deconditioning 02/27/2021   Pleural effusion on left 12/18/2020   PUD (peptic ulcer disease)    Recurrent falls 09/01/2021   Recurrent pneumonia 07/23/2021   Repeated falls 12/18/2020   Respiratory failure with hypoxia ([place]) 01/12/2021   Rotator cuff tendinitis, right 07/26/2017   S/P lumbar fusion 05/23/2021   S/P right hip fracture    11/01/16 s/p repair   Sepsis due to pneumonia ([place]) 07/22/2021   Shoulder pain    Sleep apnea    no cpap / minimal   SOB (shortness of breath) 10/13/2011   2011 [facility] Cardiology [facility] Pulmonary eval >> LHC (non-obstructive CAD), RHC (wedge 13, mean PA 26), TTE (LVEF > 55%, mild concentric LVH, mild AR, MR, PR, TR), CTAngio chest unrevealing; "well controlled asthma"  02/2011 TTE [facility]>> NL LVEF, Mild LVH, Mod AR/Mod MR   Spinal stenosis at L4-L5 level    severe with L4/L5 anterolisthesis grade 1 anterolisthesis    Spinal stenosis of lumbar region 09/15/2016   Last Assessment & Plan:   Now post op with continued back and leg pain noted. Is working with pt/ot and surgery was said to have no complications. Bowels are moving and general pain and anxiety is basically baseline.     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   Weakness of both lower extremities 12/18/2020   Social History   Socioeconomic History   Marital status: Widowed    Spouse name: Kerri Carter   Number of children: 2   Years of education: Some Coll   Highest education level: Some college, no degree  Occupational History   Occupation: Retired    Comment: retired  Tobacco Use   Smoking status: Former    Packs/day: 0.50    Years: 20.00    Total pack years: 10.00    Types: Cigarettes    Quit date: 02/02/1974    Years since quitting: 48.1   Smokeless tobacco: Never  Vaping Use   Vaping Use: Never used  Substance and Sexual Activity   Alcohol use: No   Drug use: No   Sexual activity: Not Currently  Other Topics Concern   Not on file  Social History Narrative   Lives at home in [place] grandson, his wife and their child.   Husband Kerri Carter died covid 45 late 1/2061mrried x 546years   Right-handed.   6 cups coffee per day.   Social Determinants of Health   Financial Resource Strain: Low Risk  (06/18/2021)   Overall Financial Resource Strain (CARDIA)    Difficulty of Paying Living Expenses: Not hard at all  Food Insecurity: No Food Insecurity (03/28/2022)   Hunger Vital Sign    Worried About Running Out of Food in the Last Year: Never true    Ran Out of Food in the Last Year: Never true  Transportation Needs: No Transportation Needs (03/28/2022)   PRAPARE -  THydrologist(Medical): No    Lack of Transportation (Non-Medical): No  Physical Activity: Inactive (11/24/2019)   Exercise Vital Sign    Days of Exercise per Week: 0 days    Minutes of Exercise per Session: 0 min  Stress: No Stress Concern Present (06/18/2021)   [place]   Feeling of Stress : Not at all  Social Connections: Moderately Integrated (06/18/2021)   Social Connection and Isolation Panel [NHANES]    Frequency of Communication with Friends and Family: More than three times a week    Frequency of Social Gatherings with Friends and Family: Never    Attends Religious Services: More than 4 times per year    Active Member of [employer] Organizations: No    Attends CArchivistMeetings: Never    Marital Status: Married   Family History  Problem Relation Age of Onset   Rheum arthritis Mother    Asthma Mother    Parkinson's disease Mother    Heart disease Mother    Stroke Mother    Hypertension Mother    Heart attack Father    Heart disease Father    Hypertension Father    Peripheral Artery Disease Father    Diabetes Son    Gout Son    Asthma Sister    Heart disease Sister    Lung cancer Sister    Heart disease Sister    Heart disease Sister    Breast cancer Sister    Heart attack Sister    Heart  disease Brother    Heart disease Maternal Grandmother    Diabetes Maternal Grandmother    Colon cancer Maternal Grandmother    Cancer Maternal Grandmother        Hodgkins lymphoma   Heart disease Brother    Alcohol abuse Brother    Depression Brother    Dementia Son    Scheduled Meds:  amLODipine  5 mg Oral Daily   apixaban  5 mg Oral BID   budesonide (PULMICORT) nebulizer solution  0.5 mg Nebulization BID   busPIRone  5 mg Oral BID   Chlorhexidine Gluconate Cloth  6 each Topical Daily   cholecalciferol  4,000 Units Oral Daily   escitalopram  10 mg Oral Daily    famotidine  20 mg Oral QAC breakfast   ferrous sulfate  325 mg Oral Daily   gabapentin  300 mg Oral Daily   And   gabapentin  600 mg Oral QHS   guaiFENesin  600 mg Oral BID   insulin aspart  0-5 Units Subcutaneous QHS   insulin aspart  0-9 Units Subcutaneous TID WC   ipratropium-albuterol  3 mL Nebulization Q4H   lamoTRIgine  100 mg Oral BID   lidocaine  1 patch Transdermal Daily   loratadine  10 mg Oral Daily   methylPREDNISolone (SOLU-MEDROL) injection  40 mg Intravenous Q12H   mirabegron ER  25 mg Oral Daily   montelukast  10 mg Oral Daily   multivitamin  1 tablet Oral Daily   nebivolol  5 mg Oral BID   pantoprazole  40 mg Oral BID AC   pravastatin  20 mg Oral q1800   QUEtiapine  25 mg Oral QHS   sucralfate  1 g Oral BID   Continuous Infusions:  sodium chloride Stopped (03/31/22 0309)   ampicillin-sulbactam (UNASYN) IV 200 mL/hr at 03/31/22 0900   azithromycin Stopped (03/31/22 0637)   PRN Meds:.sodium chloride, acetaminophen **OR** acetaminophen, albuterol, dicyclomine, fluticasone, magnesium hydroxide, methocarbamol, ondansetron **OR** ondansetron (ZOFRAN) IV, traZODone Medications Prior to Admission:  Prior to Admission medications   Medication Sig Start Date End Date Taking? Authorizing Provider  albuterol (PROVENTIL) (2.5 MG/3ML) 0.083% nebulizer solution Take by nebulization. 12/08/21  Yes [provider]  albuterol (VENTOLIN HFA) 108 (90 Base) MCG/ACT inhaler Inhale 2 puffs into the lungs every 6 (six) hours as needed for wheezing or shortness of breath. 06/24/21  Yes McLean-Scocuzza, Nino Glow, MD  amLODipine (NORVASC) 5 MG tablet Take 1 tablet (5 mg total) by mouth daily. 08/01/21  Yes McLean-Scocuzza, Nino Glow, MD  apixaban (ELIQUIS) 5 MG TABS tablet Take 1 tablet (5 mg total) by mouth 2 (two) times daily. 10/15/21  Yes McLean-Scocuzza, Nino Glow, MD  budesonide (PULMICORT) 0.5 MG/2ML nebulizer solution Take 2 mLs (0.5 mg total) by nebulization 2 (two) times daily.  01/01/22 01/01/23 Yes [name], MD  busPIRone (BUSPAR) 5 MG tablet Take 1 tablet (5 mg total) by mouth 2 (two) times daily. 06/02/21  Yes Jennye Boroughs, MD  cetirizine (ZYRTEC ALLERGY) 10 MG tablet Take 1 tablet (10 mg total) by mouth at bedtime. 02/26/22  Yes [name], MD  Cholecalciferol (VITAMIN D3) 50 MCG (2000 UT) TABS Take 4,000 Units by mouth daily. 08/01/21  Yes McLean-Scocuzza, Nino Glow, MD  dicyclomine (BENTYL) 10 MG capsule TAKE 1 CAPSULE (10 MG TOTAL) BY MOUTH 2 (TWO) TIMES DAILY AS NEEDED FOR SPASMS. 11/03/21 10/29/22 Yes Jonathon Bellows, MD  ENBREL SURECLICK 50 MG/ML injection Inject 50 mg into the skin once a  week.   Yes [provider]  escitalopram (LEXAPRO) 10 MG tablet Take 1 tablet (10 mg total) by mouth daily. 08/01/21  Yes McLean-Scocuzza, Nino Glow, MD  ferrous sulfate 325 (65 FE) MG EC tablet Take 1 tablet (325 mg total) by mouth 2 (two) times daily. Patient taking differently: Take 325 mg by mouth 2 (two) times daily. Pt reports MD instructed her to take twice a day every other day. 09/12/21 09/12/22 Yes Danford, Suann Larry, MD  gabapentin (NEURONTIN) 300 MG capsule Take 300 mg in the morning and 600 mg at night. 01/06/22  Yes Carollee Leitz, MD  ipratropium-albuterol (DUONEB) 0.5-2.5 (3) MG/3ML SOLN SMARTSIG:3 Milliliter(s) Via Nebulizer Every 6 Hours PRN 03/25/22  Yes [provider]  lamoTRIgine (LAMICTAL) 100 MG tablet TAKE 1 TAB 2 TIMES DAILY. FURTHER REFILLS NEW PSYCH FOR ALL PSYCH MEDS ONLY TEMP SUPPLY FROM PCP 05/02/21  Yes McLean-Scocuzza, Nino Glow, MD  leflunomide (ARAVA) 20 MG tablet Take 1 tablet (20 mg total) by mouth daily. 09/30/17  Yes McLean-Scocuzza, Nino Glow, MD  lidocaine (LIDODERM) 5 % Place 1 patch onto the skin 2 (two) times daily as needed. Remove & Discard patch within 12 hours or as directed by MD Patient taking differently: Place 1 patch onto the skin daily. Remove & Discard patch within 12 hours or as directed by MD 11/22/20  Yes  McLean-Scocuzza, Nino Glow, MD  lovastatin (MEVACOR) 20 MG tablet TAKE 1 TABLET BY MOUTH EVERYDAY AT BEDTIME *STOP TALKING '40MG'$ * Patient taking differently: Take 20 mg by mouth at bedtime. 08/01/21  Yes McLean-Scocuzza, Nino Glow, MD  methocarbamol (ROBAXIN) 500 MG tablet Take 500 mg by mouth 4 (four) times daily as needed. Taking twice a day currently 01/23/21  Yes [provider]  montelukast (SINGULAIR) 10 MG tablet TAKE 1 TABLET BY MOUTH EVERY DAY Patient taking differently: Take 10 mg by mouth daily. 03/24/21  Yes McLean-Scocuzza, Nino Glow, MD  multivitamin-lutein [facility]) CAPS capsule Take 1 capsule by mouth at bedtime.   Yes [provider]  nebivolol (BYSTOLIC) 5 MG tablet Take 1 tablet (5 mg total) by mouth in the morning and at bedtime. (Note dose changed from 1/2 10 mg bid to 5 mg bid) 08/01/21  Yes McLean-Scocuzza, Nino Glow, MD  pantoprazole (PROTONIX) 40 MG tablet TAKE 1 TABLET BY MOUTH 2 TIMES DAILY 30 MIN BEFORE FOOD (NOTE REDUCTION IN FREQUENCY) 11/03/21  Yes McLean-Scocuzza, Nino Glow, MD  predniSONE (DELTASONE) 5 MG tablet TAKE 1 TABLET BY MOUTH EVERY DAY WITH BREAKFAST 02/20/22  Yes [name], MD  PROLIA 60 MG/ML SOSY injection Inject 60 mg into the skin every 6 (six) months. 03/09/22  Yes [provider]  QUEtiapine (SEROQUEL) 25 MG tablet TAKE 1 TABLET (25 MG TOTAL) BY MOUTH AT BEDTIME. AGAIN LAST FILL FURTHER REFILLS FROM PSYCHIATRY NO EXCEPTIONS 09/13/20  Yes McLean-Scocuzza, Nino Glow, MD  sucralfate (CARAFATE) 1 g tablet TAKE 1 TABLET BY MOUTH 2 TIMES DAILY. 11/12/21  Yes Jonathon Bellows, MD  torsemide (DEMADEX) 20 MG tablet Take 1 tablet (20 mg total) by mouth daily. 09/12/21  Yes Danford, Suann Larry, MD  traMADol (ULTRAM) 50 MG tablet Take 50 mg by mouth 2 (two) times daily. 03/25/22  Yes [provider]  Vibegron (GEMTESA) 75 MG TABS Take 75 mg by mouth daily. 01/08/22  Yes Vaillancourt, Samantha, PA-C  mometasone (NASONEX) 50 MCG/ACT nasal  spray 1 spray to each nostril twice a day 02/26/22   [name], MD  OXYGEN Inhale 3  L into the lungs at bedtime.    [provider]   Allergies  Allergen Reactions   Ceftin [Cefuroxime Axetil] Anaphylaxis   Lisinopril Anaphylaxis   Sulfa Antibiotics Other (See Comments)    Face swelling   Sulfasalazine Anaphylaxis   Morphine Other (See Comments)    Per patient, low blood pressure issues that requires action to raise it back up. Can take small infrequent doses   Xarelto [Rivaroxaban] Other (See Comments)    Stomach burning, bleeding, and tar in stool   Adhesive [Tape] Rash    Paper tape and tega derm OK   Antihistamines, Chlorpheniramine-Type Other (See Comments)    Makes pt hyper   Antivert [Meclizine Hcl] Other (See Comments)    Bladder will not empty   Aspirin Other (See Comments)    Sulfasalazine allergy cross reacts   Contrast Media [Iodinated Contrast Media] Rash    she is able to use betadine scrubs.   Decongestant [Pseudoephedrine Hcl] Other (See Comments)    Makes pt hyper   Doxycycline Other (See Comments)    GI upset   Levaquin [Levofloxacin In D5w] Rash   Polymyxin B Other (See Comments)    Facial rash   Tetanus Toxoids Rash and Other (See Comments)    Fever and hot to touch at injection site   Review of Systems  Respiratory:  Positive for cough and shortness of breath.     Physical Exam Pulmonary:     Comments: Some WOB noted.  Neurological:     Mental Status: She is alert.     Vital Signs: BP 127/64   Pulse 83   Temp 98.3 F (36.8 C) (Oral)   Resp (!) 22   Ht '4\' 11"'$  (1.499 m)   Wt 83.6 kg   SpO2 (!) 89%   BMI 37.22 kg/m  Pain Scale: 0-10 POSS *See Group Information*: S-Acceptable,Sleep, easy to arouse Pain Score: 0-No pain   SpO2: SpO2: (!) 89 % O2 Device:SpO2: (!) 89 % O2 Carter Rate: .O2 Carter Rate (L/min): 4 L/min  IO: Intake/output summary:  Intake/Output Summary (Last 24 hours) at 03/31/2022 1413 Last data filed at  03/31/2022 1200 Gross per 24 hour  Intake 1085.57 ml  Output 2660 ml  Net -1574.43 ml    LBM: Last BM Date : 03/31/22 Baseline Weight: Weight: 80 kg Most recent weight: Weight: 83.6 kg        Signed by: Asencion Gowda, NP   Please contact Palliative Medicine Team phone at [phone] for questions and concerns.  For individual provider: See Shea Evans

## 2022-04-01 DIAGNOSIS — A419 Sepsis, unspecified organism: Secondary | ICD-10-CM | POA: Diagnosis not present

## 2022-04-01 DIAGNOSIS — I480 Paroxysmal atrial fibrillation: Secondary | ICD-10-CM

## 2022-04-01 DIAGNOSIS — Z7189 Other specified counseling: Secondary | ICD-10-CM | POA: Diagnosis not present

## 2022-04-01 DIAGNOSIS — J9621 Acute and chronic respiratory failure with hypoxia: Secondary | ICD-10-CM | POA: Diagnosis not present

## 2022-04-01 DIAGNOSIS — J9622 Acute and chronic respiratory failure with hypercapnia: Secondary | ICD-10-CM

## 2022-04-01 DIAGNOSIS — J189 Pneumonia, unspecified organism: Secondary | ICD-10-CM | POA: Diagnosis not present

## 2022-04-01 DIAGNOSIS — J441 Chronic obstructive pulmonary disease with (acute) exacerbation: Secondary | ICD-10-CM | POA: Diagnosis not present

## 2022-04-01 LAB — BASIC METABOLIC PANEL
Anion gap: 10 (ref 5–15)
BUN: 37 mg/dL — ABNORMAL HIGH (ref 8–23)
CO2: 29 mmol/L (ref 22–32)
Calcium: 7.3 mg/dL — ABNORMAL LOW (ref 8.9–10.3)
Chloride: 102 mmol/L (ref 98–111)
Creatinine, Ser: 1.46 mg/dL — ABNORMAL HIGH (ref 0.44–1.00)
GFR, Estimated: 36 mL/min — ABNORMAL LOW (ref >60)
Glucose, Bld: 191 mg/dL — ABNORMAL HIGH (ref 70–99)
Potassium: 4.2 mmol/L (ref 3.5–5.1)
Sodium: 141 mmol/L (ref 135–145)

## 2022-04-01 LAB — CBC
HCT: 30.6 % — ABNORMAL LOW (ref 36.0–46.0)
Hemoglobin: 9.6 g/dL — ABNORMAL LOW (ref 12.0–15.0)
MCH: 28.1 pg (ref 26.0–34.0)
MCHC: 31.4 g/dL (ref 30.0–36.0)
MCV: 89.5 fL (ref 80.0–100.0)
Platelets: 393 10*3/uL (ref 150–400)
RBC: 3.42 MIL/uL — ABNORMAL LOW (ref 3.87–5.11)
RDW: 15.6 % — ABNORMAL HIGH (ref 11.5–15.5)
WBC: 16.5 10*3/uL — ABNORMAL HIGH (ref 4.0–10.5)
nRBC: 0.1 % (ref 0.0–0.2)

## 2022-04-01 LAB — GLUCOSE, CAPILLARY
Glucose-Capillary: 214 mg/dL — ABNORMAL HIGH (ref 70–99)
Glucose-Capillary: 174 mg/dL — ABNORMAL HIGH (ref 70–99)
Glucose-Capillary: 180 mg/dL — ABNORMAL HIGH (ref 70–99)
Glucose-Capillary: 262 mg/dL — ABNORMAL HIGH (ref 70–99)

## 2022-04-01 MED ORDER — MORPHINE SULFATE (PF) 2 MG/ML IV SOLN
1.0000 mg | INTRAVENOUS | Status: DC | PRN
  Administered 2022-04-01: 1 mg via INTRAVENOUS
  Filled 2022-04-01: qty 1

## 2022-04-01 MED ORDER — SALINE SPRAY 0.65 % NA SOLN
1.0000 | NASAL | Status: DC | PRN
  Filled 2022-04-01: qty 44

## 2022-04-01 MED ORDER — FUROSEMIDE 10 MG/ML IJ SOLN
20.0000 mg | Freq: Once | INTRAMUSCULAR | Status: AC
  Administered 2022-04-01: 20 mg via INTRAVENOUS
  Filled 2022-04-01: qty 2

## 2022-04-01 MED ORDER — CHLORHEXIDINE GLUCONATE CLOTH 2 % EX PADS
6.0000 | MEDICATED_PAD | Freq: Every day | CUTANEOUS | Status: DC
  Administered 2022-04-02 – 2022-04-05 (×4): 6 via TOPICAL

## 2022-04-01 NOTE — Progress Notes (Signed)
Patient transitioned to 5L [place] to eat lunch.

## 2022-04-01 NOTE — TOC Progression Note (Signed)
Transition of Care [facility]) - Progression Note    Patient Details  Name: Kerri Carter MRN: [id number] Date of Birth: 1940-12-19  Transition of Care [facility]) CM/SW [place],  Phone Number: 04/01/2022, 10:52 AM  Clinical Narrative:     CSW working with [employer] for NIV trilogy, per RT patient is not able to come off bipap at this time. Will need ABG when more medically appropriate off bipap, once patient is able to come off bipap for more than 2 hours.   Pending medical/respiratory improvements to move forward with ABG then NIV order.   Expected Discharge Plan:  (TBD) Barriers to Discharge: Continued Medical Work up  Expected Discharge Plan and Services                                               Social Determinants of Health (SDOH) Interventions SDOH Screenings   Food Insecurity: No Food Insecurity (03/28/2022)  Housing: Low Risk  (03/28/2022)  Transportation Needs: No Transportation Needs (03/28/2022)  Utilities: Not At Risk (03/28/2022)  Depression (PHQ2-9): Low Risk  (01/06/2022)  Financial Resource Strain: Low Risk  (06/18/2021)  Physical Activity: Inactive (11/24/2019)  Social Connections: Moderately Integrated (06/18/2021)  Stress: No Stress Concern Present (06/18/2021)  Tobacco Use: Medium Risk (03/28/2022)    Readmission Risk Interventions    08/21/2021    3:19 PM 05/24/2021    2:35 PM  Readmission Risk Prevention Plan  Transportation Screening Complete Complete  PCP or Specialist Appt within 3-5 Days  Complete  HRI or [place]  Complete  Social Work Consult for [place] Planning/Counseling  Complete  Palliative Care Screening  Not Applicable  Medication Review Press photographer) Complete Complete  HRI or [place] Complete   SW Recovery Care/Counseling Consult Complete   Palliative Care Screening Not [place] Not Applicable

## 2022-04-01 NOTE — Progress Notes (Signed)
Daily Progress Note   Patient Name: Kerri Carter       Date: 04/01/2022 DOB: 1940-11-08  Age: 82 y.o. MRN#: [id number] Attending Physician: Jennye Boroughs, MD Primary Care Physician: Carollee Leitz, MD Admit Date: 03/28/2022  Reason for Consultation/Follow-up: Establishing goals of care  Subjective: Notes and labs reviewed. In to see patient. Patient resting in bed on BIPAP. Her grandson is at bedside. Patient states she was removed from this long enough to eat but required it back quickly. Patient states her biggest fear is death from suffocation. She states she is okay with continuing current care. Grandson discusses patient's husband (his grandfather) when he died at [facility] under comfort care. Reviewed comfort care.   PMT will follow.   Length of Stay: 4  Current Medications: Scheduled Meds:   amLODipine  5 mg Oral Daily   apixaban  5 mg Oral BID   budesonide (PULMICORT) nebulizer solution  0.5 mg Nebulization BID   busPIRone  5 mg Oral BID   Chlorhexidine Gluconate Cloth  6 each Topical Daily   cholecalciferol  4,000 Units Oral Daily   escitalopram  10 mg Oral Daily   famotidine  20 mg Oral QAC breakfast   ferrous sulfate  325 mg Oral Daily   gabapentin  300 mg Oral Daily   And   gabapentin  600 mg Oral QHS   guaiFENesin  600 mg Oral BID   insulin aspart  0-5 Units Subcutaneous QHS   insulin aspart  0-9 Units Subcutaneous TID WC   ipratropium-albuterol  3 mL Nebulization Q4H   lamoTRIgine  100 mg Oral BID   lidocaine  1 patch Transdermal Daily   loratadine  10 mg Oral Daily   methylPREDNISolone (SOLU-MEDROL) injection  40 mg Intravenous Q12H   mirabegron ER  25 mg Oral Daily   montelukast  10 mg Oral Daily   multivitamin  1 tablet Oral Daily   nebivolol  5 mg Oral BID    pantoprazole  40 mg Oral BID AC   pravastatin  20 mg Oral q1800   QUEtiapine  25 mg Oral QHS   sucralfate  1 g Oral BID   torsemide  20 mg Oral Daily    Continuous Infusions:  sodium chloride Stopped (03/31/22 0309)   ampicillin-sulbactam (UNASYN) IV 3 g (04/01/22 1013)  PRN Meds: sodium chloride, acetaminophen **OR** acetaminophen, albuterol, dicyclomine, fluticasone, magnesium hydroxide, methocarbamol, morphine injection, ondansetron **OR** ondansetron (ZOFRAN) IV, traZODone  Physical Exam Pulmonary:     Comments: On BIPAP.  Neurological:     Mental Status: She is alert.             Vital Signs: BP (!) 140/80   Pulse 75   Temp 97.9 F (36.6 C) (Axillary)   Resp 15   Ht '4\' 11"'$  (1.499 m)   Wt 84.8 kg   SpO2 93%   BMI 37.76 kg/m  SpO2: SpO2: 93 % O2 Device: O2 Device: Bi-PAP O2 Flow Rate: O2 Flow Rate (L/min): 5 L/min  Intake/output summary:  Intake/Output Summary (Last 24 hours) at 04/01/2022 1445 Last data filed at 04/01/2022 1400 Gross per 24 hour  Intake 553.42 ml  Output 3330 ml  Net -2776.58 ml   LBM: Last BM Date : 03/31/22 Baseline Weight: Weight: 80 kg Most recent weight: Weight: 84.8 kg    Patient Active Problem List   Diagnosis Date Noted   Obesity (BMI 30-39.9) 03/31/2022   NSVT (nonsustained ventricular tachycardia) ([place]) 03/30/2022   Acute urinary retention 03/30/2022   Sepsis due to pneumonia ([place]) 03/28/2022   Demand ischemia 03/28/2022   History of major orthopedic surgery 03/19/2022   Iron deficiency anemia due to chronic blood loss 10/09/2021   Chronic heart failure with preserved ejection fraction ([place]) 09/24/2021   Acute on chronic respiratory failure with hypoxia and hypercapnia ([place]) 09/10/2021   Acute on chronic diastolic CHF (congestive heart failure) ([place]) 08/20/2021   Paroxysmal atrial fibrillation ([place]) 07/29/2021   History of lumbar fusion 05/23/2021   Impaired mobility 12/18/2020   Urinary incontinence 12/18/2020   Lumbar  radiculopathy 12/11/2020   Chronic pain of both shoulders 08/29/2019   Hypertriglyceridemia 07/24/2019   Bipolar disorder, in full remission, most recent episode depressed ([place]) 07/13/2019   Essential hypertension 06/16/2019   Chronic pain 06/16/2019   Lymphedema 06/05/2019   Chronic venous insufficiency 05/25/2019   PAD (peripheral artery disease) ([place]) 05/25/2019   Interstitial pulmonary disease ([place]) 07/07/2018   Leukocytosis 10/19/2017   Allergic rhinitis 09/28/2017   Age-related osteoporosis without current pathological fracture 11/05/2016   Peripheral neuropathy 10/28/2016   Osteoporosis 08/27/2016   Drug-induced osteoporosis 08/27/2016   Adnexal mass 08/25/2016   Prediabetes 08/25/2016   Coronary atherosclerosis 08/25/2016   Stage 3b chronic kidney disease ([place])    Hyperlipidemia 07/17/2015   Macular degeneration 07/17/2015   Rheumatoid arthritis ([place]) 12/05/2014   Osteoarthritis of knee 06/07/2013   Valvular heart disease 10/13/2011   COPD with acute exacerbation ([place]) 09/09/2011   OSA (obstructive sleep apnea) 09/09/2011   Gastroesophageal reflux disease 02/24/2011   Single kidney 02/24/2011   Renal artery stenosis ([place]) 02/24/2011    Palliative Care Assessment & Plan     Recommendations/Plan: Continue current care.   Code Status:    Code Status Orders  (From admission, onward)           Start     Ordered   03/28/22 0250  Do not attempt resuscitation (DNR)  Continuous       Question Answer Comment  If patient has no pulse and is not breathing Do Not Attempt Resuscitation   If patient has a pulse and/or is breathing: Medical Treatment Goals LIMITED ADDITIONAL INTERVENTIONS: Use medication/IV fluids and cardiac monitoring as indicated; Do not use intubation or mechanical ventilation (DNI), also provide comfort medications.  Transfer to Progressive/Stepdown as indicated, avoid Intensive Care.  Consent: Discussion documented in EHR or advanced directives  reviewed      03/28/22 0252           Code Status History     Date Active Date Inactive Code Status Order ID Comments User Context   09/08/2021 0912 09/12/2021 2210 DNR [id number]  Collier Bullock, MD ED   08/20/2021 0947 08/22/2021 2237 DNR [id number]  Norval Morton, MD ED   07/28/2021 1805 07/31/2021 2134 DNR [id number]  Neena Rhymes, MD ED   07/22/2021 2029 07/24/2021 2142 DNR [id number]  Cox, [place], DO ED   05/27/2021 1832 06/02/2021 2105 DNR [id number]  Flora Lipps, MD Inpatient   05/23/2021 1306 05/27/2021 1832 Full Code [id number]  Loleta Dicker, [place] Inpatient   01/12/2021 2243 01/13/2021 2354 Full Code [id number]  Para Skeans, MD ED   12/11/2020 1422 12/13/2020 2016 Full Code [id number]  Loleta Dicker, [place] Inpatient   10/29/2020 2232 11/04/2020 1947 Full Code [id number]  Mansy, Arvella Merles, MD ED   12/21/2019 1736 12/26/2019 1707 Full Code [id number]  Corky Mull, MD Inpatient   07/26/2018 1110 07/28/2018 1750 Full Code [id number]  Corky Mull, MD Inpatient   11/04/2017 1231 11/05/2017 2155 Full Code [id number]  Corky Mull, MD Inpatient   10/31/2016 2128 11/04/2016 1655 Full Code [id number]  Fritzi Mandes, MD Inpatient   05/07/2016 0944 05/14/2016 2144 Full Code [id number]  Harrie Foreman Inpatient   09/26/2015 2259 09/27/2015 2044 Full Code [id number]  Lance Coon, MD ED      Advance Directive Documentation    Flowsheet Row Most Recent Value  Type of Advance Directive Living will, Healthcare Power of Attorney  Pre-existing out of facility DNR order (yellow form or pink MOST form) --  "MOST" Form in Place? --       Prognosis: Poor overall    Thank you for allowing the Palliative Medicine Team to assist in the care of this patient.   Asencion Gowda, NP  Please contact Palliative Medicine Team phone at [phone] for questions and concerns.

## 2022-04-01 NOTE — Progress Notes (Signed)
Patient placed back on BiPap per request for increased WOB.

## 2022-04-01 NOTE — Progress Notes (Signed)
Pt placed on 5L [place] for trial off of Bipap

## 2022-04-01 NOTE — Progress Notes (Addendum)
Progress Note    Kerri Carter  [id number] DOB: 04/14/1940  DOA: 03/28/2022 PCP: Carollee Leitz, MD      Brief Narrative:    Medical records reviewed and are as summarized below:  Kerri Carter is a 82 y.o. female  with medical history significant of chronic respiratory failure with hypoxia on 3 L/min O2 at baseline, COPD on chronic prednisone, OSA, interstitial lung disease, chronic diastolic CHF, hypertension, paroxysmal A-fib, valvular heart disease, PAD, chronic venous insufficiency, renal artery stenosis, GERD, RA, peripheral neuropathy, lumbar radiculopathy, CKD stage IIIb, chronic leukocytosis, bipolar disorder, chronic pain with lumbar radiculopathy.  She presented to the ED on the evening of 03/27/2022 for evaluation of worsening shortness of breath and in respiratory distress.     She was found to have acute on chronic hypoxic respiratory failure due to multifocal pneumonia and secondary COPD exacerbation.  Started on IV antibiotics and IV steroids and admitted to the hospital.  2/25: remains on 4 L/min O2 with sats 91-92%. Ongoing dyspnea at rest and with conversation.  2/26: rapid response was called on 2/26 because of increased O2 needs, pt unresponsive with increased WOB.  Placed on BiPAP, transferred to stepdown.  ABG with pCO2 73, pH 7.1.  Pulmonology consulted.       Assessment/Plan:   Principal Problem:   Sepsis due to pneumonia [facility]) Active Problems:   COPD with acute exacerbation ([place])   Acute on chronic respiratory failure with hypoxia and hypercapnia (HCC)   NSVT (nonsustained ventricular tachycardia) (HCC)   Stage 3b chronic kidney disease (HCC)   Interstitial pulmonary disease (HCC)   Paroxysmal atrial fibrillation (HCC)   Chronic heart failure with preserved ejection fraction (HCC)   Demand ischemia   Essential hypertension   Rheumatoid arthritis ([place])   History of lumbar fusion   Hyperlipidemia   Gastroesophageal reflux disease   OSA  (obstructive sleep apnea)   Valvular heart disease   Peripheral neuropathy   Chronic venous insufficiency   PAD (peripheral artery disease) (HCC)   Bipolar disorder, in full remission, most recent episode depressed (HCC)   Chronic pain   Lumbar radiculopathy   Urinary incontinence   Iron deficiency anemia due to chronic blood loss   Acute urinary retention   Obesity (BMI 30-39.9)    Body mass index is 37.76 kg/m.  (Obesity)   Sepsis secondary to pneumonia: Continue empiric IV antibiotics (IV Unasyn).  Completed IV azithromycin.   Acute on chronic hypoxic respiratory failure, acute hypercapnic respiratory failure: She was taken off BiPAP and placed on 5 L/min oxygen via [place] this morning.  However, she developed respiratory distress and so she was placed back on BiPAP.  Wean off BiPAP as able.  Obtain ABG once stable off BiPAP to determine need for trilogy machine at discharge   COPD exacerbation: Continue IV steroids and bronchodilators.   Acute on chronic diastolic CHF, valvular heart disease (moderate to severe MR, moderate TR, mild to moderate AS): Give 1 dose of IV Lasix today.  Continue torsemide.Marland Kitchen  BNP was 933 on 03/30/2022.   2D echo in June 2023 showed EF estimated at 55 to [id number], grade 1 diastolic dysfunction   Elevated troponin: This is likely due to demand ischemia/myocardial injury from sepsis and respiratory failure   Paroxysmal atrial fibrillation: Continue Eliquis   Acute urinary retention, history of urinary incontinence: Foley catheter placed on 03/30/2022.  At that time, bladder scan showed greater than 800 mL of urine.   Other comorbidities  include interstitial lung disease, CKD stage IIIb with single left kidney, hypertension, iron deficiency anemia, rheumatoid arthritis, lumbar radiculopathy, bipolar disorder, PVD, chronic venous insufficiency, OSA (on CPAP  is old and dysfunctional)    Diet Order             Diet Heart Room service appropriate? Yes;  Fluid consistency: Thin  Diet effective now                            Consultants: Pulmonologist, palliative care  Procedures: None    Medications:    amLODipine  5 mg Oral Daily   apixaban  5 mg Oral BID   budesonide (PULMICORT) nebulizer solution  0.5 mg Nebulization BID   busPIRone  5 mg Oral BID   Chlorhexidine Gluconate Cloth  6 each Topical Daily   cholecalciferol  4,000 Units Oral Daily   escitalopram  10 mg Oral Daily   famotidine  20 mg Oral QAC breakfast   ferrous sulfate  325 mg Oral Daily   gabapentin  300 mg Oral Daily   And   gabapentin  600 mg Oral QHS   guaiFENesin  600 mg Oral BID   insulin aspart  0-5 Units Subcutaneous QHS   insulin aspart  0-9 Units Subcutaneous TID WC   ipratropium-albuterol  3 mL Nebulization Q4H   lamoTRIgine  100 mg Oral BID   lidocaine  1 patch Transdermal Daily   loratadine  10 mg Oral Daily   methylPREDNISolone (SOLU-MEDROL) injection  40 mg Intravenous Q12H   mirabegron ER  25 mg Oral Daily   montelukast  10 mg Oral Daily   multivitamin  1 tablet Oral Daily   nebivolol  5 mg Oral BID   pantoprazole  40 mg Oral BID AC   pravastatin  20 mg Oral q1800   QUEtiapine  25 mg Oral QHS   sucralfate  1 g Oral BID   torsemide  20 mg Oral Daily   Continuous Infusions:  sodium chloride Stopped (03/31/22 0309)   ampicillin-sulbactam (UNASYN) IV 3 g (04/01/22 1013)     Anti-infectives (From admission, onward)    Start     Dose/Rate Route Frequency Ordered Stop   03/29/22 0600  azithromycin (ZITHROMAX) 500 mg in sodium chloride 0.9 % 250 mL IVPB        500 mg 250 mL/hr over 60 Minutes Intravenous Every 24 hours 03/28/22 0252 04/01/22 0609   03/28/22 1200  Ampicillin-Sulbactam (UNASYN) 3 g in sodium chloride 0.9 % 100 mL IVPB  Status:  Discontinued        3 g 200 mL/hr over 30 Minutes Intravenous Every 12 hours 03/28/22 0252 03/28/22 0756   03/28/22 0900  Ampicillin-Sulbactam (UNASYN) 3 g in sodium chloride 0.9 %  100 mL IVPB        3 g 200 mL/hr over 30 Minutes Intravenous Every 6 hours 03/28/22 0756     03/28/22 0145  Ampicillin-Sulbactam (UNASYN) 3 g in sodium chloride 0.9 % 100 mL IVPB        3 g 200 mL/hr over 30 Minutes Intravenous  Once 03/28/22 0141 03/28/22 0210   03/28/22 0145  azithromycin (ZITHROMAX) 500 mg in sodium chloride 0.9 % 250 mL IVPB        500 mg 250 mL/hr over 60 Minutes Intravenous  Once 03/28/22 0141 03/28/22 0419              Family Communication/Anticipated D/C date and  plan/Code Status   DVT prophylaxis:  apixaban (ELIQUIS) tablet 5 mg     Code Status: DNR  Family Communication: Plan discussed with Scientist, physiological, son, over the phone Disposition Plan: Plan to discharge home when medically stable   Status is: Inpatient Remains inpatient appropriate because: Acute respiratory failure       Subjective:   Interval events noted.  She complains of shortness of breath.  No chest pain.  Lysbeth Galas, RN, was at the bedside  Objective:    Vitals:   04/01/22 0812 04/01/22 0833 04/01/22 0900 04/01/22 1115  BP:   118/85   Pulse: 76  86 71  Resp: 15  (!) 25 17  Temp:      TempSrc:      SpO2: 96% 91% (!) 87% 96%  Weight:      Height:       No data found.   Intake/Output Summary (Last 24 hours) at 04/01/2022 1124 Last data filed at 04/01/2022 0900 Gross per 24 hour  Intake 793.42 ml  Output 2190 ml  Net -1396.58 ml   Filed Weights   03/30/22 0500 03/31/22 0500 04/01/22 0500  Weight: 79.5 kg 83.6 kg 84.8 kg    Exam:   GEN: NAD SKIN: Warm and dry EYES: EOMI ENT: MMM CV: RRR PULM: Decreased air entry bilaterally, bilateral wheezing, bibasilar rales ABD: soft, ND, NT, +BS CNS: AAO x 3, non focal EXT: No edema or tenderness       Data Reviewed:   I have personally reviewed following labs and imaging studies:  Labs: Labs show the following:   Basic Metabolic Panel: Recent Labs  Lab 03/28/22 0545 03/29/22 0445 03/30/22 0428  03/31/22 0317 04/01/22 0504  NA 139 137 140 141 141  K 4.0 4.0 3.8 3.9 4.2  CL 101 103 104 103 102  CO2 '26 26 28 27 29  '$ GLUCOSE 175* 183* 150* 193* 191*  BUN 25* 29* 34* 34* 37*  CREATININE 1.26* 1.29* 1.28* 1.37* 1.46*  CALCIUM 8.1* 8.2* 7.9* 7.3* 7.3*  MG  --  2.0  --  2.3  --    GFR Estimated Creatinine Clearance: 29.5 mL/min (A) (by C-G formula based on SCr of 1.46 mg/dL (H)). Liver Function Tests: Recent Labs  Lab 03/28/22 0104  AST 27  ALT 16  ALKPHOS 122  BILITOT 0.6  PROT 7.4  ALBUMIN 3.1*   No results for input(s): "LIPASE", "AMYLASE" in the last 168 hours. No results for input(s): "AMMONIA" in the last 168 hours. Coagulation profile Recent Labs  Lab 03/28/22 0545  INR 1.3*    CBC: Recent Labs  Lab 03/28/22 0104 03/28/22 0545 03/29/22 0445 03/30/22 0428 03/31/22 0317 04/01/22 0504  WBC 17.4* 17.2* 19.9* 20.8* 15.7* 16.5*  NEUTROABS 12.6*  --   --   --   --   --   HGB 10.5* 9.8* 9.1* 9.1* 9.1* 9.6*  HCT 34.2* 31.2* 29.1* 28.9* 29.6* 30.6*  MCV 90.2 88.4 88.2 89.5 90.0 89.5  PLT 397 317 345 383 367 393   Cardiac Enzymes: No results for input(s): "CKTOTAL", "CKMB", "CKMBINDEX", "TROPONINI" in the last 168 hours. BNP (last 3 results) No results for input(s): "PROBNP" in the last 8760 hours. CBG: Recent Labs  Lab 03/30/22 0928 03/31/22 1201 03/31/22 1627 03/31/22 2138 04/01/22 0716  GLUCAP 361* 202* 214* 174* 180*   D-Dimer: No results for input(s): "DDIMER" in the last 72 hours. Hgb A1c: Recent Labs    03/31/22 0308  HGBA1C 6.1*   Lipid  Profile: No results for input(s): "CHOL", "HDL", "LDLCALC", "TRIG", "CHOLHDL", "LDLDIRECT" in the last 72 hours. Thyroid function studies: No results for input(s): "TSH", "T4TOTAL", "T3FREE", "THYROIDAB" in the last 72 hours.  Invalid input(s): "FREET3" Anemia work up: No results for input(s): "VITAMINB12", "FOLATE", "FERRITIN", "TIBC", "IRON", "RETICCTPCT" in the last 72 hours. Sepsis Labs: Recent  Labs  Lab 03/28/22 0104 03/28/22 0105 03/28/22 0545 03/29/22 0445 03/30/22 0428 03/30/22 0908 03/31/22 0317 04/01/22 0504  PROCALCITON <0.10  --  0.76  --   --  0.80  --   --   WBC 17.4*  --  17.2* 19.9* 20.8*  --  15.7* 16.5*  LATICACIDVEN  --  2.2* 1.4  --   --   --   --   --     Microbiology Recent Results (from the past 240 hour(s))  Resp panel by RT-PCR (RSV, Flu A&B, Covid) Anterior Nasal Swab     Status: None   Collection Time: 03/28/22  1:03 AM   Specimen: Anterior Nasal Swab  Result Value Ref Range Status   SARS Coronavirus 2 by RT PCR NEGATIVE NEGATIVE Final    Comment: (NOTE) SARS-CoV-2 target nucleic acids are NOT DETECTED.  The SARS-CoV-2 RNA is generally detectable in upper respiratory specimens during the acute phase of infection. The lowest concentration of SARS-CoV-2 viral copies this assay can detect is 138 copies/mL. A negative result does not preclude SARS-Cov-2 infection and should not be used as the sole basis for treatment or other patient management decisions. A negative result may occur with  improper specimen collection/handling, submission of specimen other than nasopharyngeal swab, presence of viral mutation(s) within the areas targeted by this assay, and inadequate number of viral copies(<138 copies/mL). A negative result must be combined with clinical observations, patient history, and epidemiological information. The expected result is Negative.  Fact Sheet for Patients:  EntrepreneurPulse.com.au  Fact Sheet for Healthcare Providers:  IncredibleEmployment.be  This test is no t yet approved or cleared by the [place] FDA and  has been authorized for detection and/or diagnosis of SARS-CoV-2 by FDA under an Emergency Use Authorization (EUA). This EUA will remain  in effect (meaning this test can be used) for the duration of the COVID-19 declaration under Section 564(b)(1) of the Act, 21 U.S.C.section  360bbb-3(b)(1), unless the authorization is terminated  or revoked sooner.       Influenza A by PCR NEGATIVE NEGATIVE Final   Influenza B by PCR NEGATIVE NEGATIVE Final    Comment: (NOTE) The Xpert Xpress SARS-CoV-2/FLU/RSV plus assay is intended as an aid in the diagnosis of influenza from Nasopharyngeal swab specimens and should not be used as a sole basis for treatment. Nasal washings and aspirates are unacceptable for Xpert Xpress SARS-CoV-2/FLU/RSV testing.  Fact Sheet for Patients: EntrepreneurPulse.com.au  Fact Sheet for Healthcare Providers: IncredibleEmployment.be  This test is not yet approved or cleared by the [place] FDA and has been authorized for detection and/or diagnosis of SARS-CoV-2 by FDA under an Emergency Use Authorization (EUA). This EUA will remain in effect (meaning this test can be used) for the duration of the COVID-19 declaration under Section 564(b)(1) of the Act, 21 U.S.C. section 360bbb-3(b)(1), unless the authorization is terminated or revoked.     Resp Syncytial Virus by PCR NEGATIVE NEGATIVE Final    Comment: (NOTE) Fact Sheet for Patients: EntrepreneurPulse.com.au  Fact Sheet for Healthcare Providers: IncredibleEmployment.be  This test is not yet approved or cleared by the [place] and has been authorized for  detection and/or diagnosis of SARS-CoV-2 by FDA under an Emergency Use Authorization (EUA). This EUA will remain in effect (meaning this test can be used) for the duration of the COVID-19 declaration under Section 564(b)(1) of the Act, 21 U.S.C. section 360bbb-3(b)(1), unless the authorization is terminated or revoked.  Performed at [facility], [address] [place], [place] [address]   Culture, blood (routine x 2)     Status: None (Preliminary result)   Collection Time: 03/28/22  1:03 AM   Specimen: BLOOD  Result Value Ref Range  Status   Specimen Description BLOOD BLOOD RIGHT FOREARM  Final   Special Requests   Final    BOTTLES DRAWN AEROBIC AND ANAEROBIC Blood Culture adequate volume   Culture   Final    NO GROWTH 4 DAYS Performed at [facility], [address], [place], [place] [address]    Report Status PENDING  Incomplete  Culture, blood (routine x 2)     Status: None (Preliminary result)   Collection Time: 03/28/22  1:05 AM   Specimen: BLOOD  Result Value Ref Range Status   Specimen Description BLOOD RIGHT ANTECUBITAL  Final   Special Requests   Final    BOTTLES DRAWN AEROBIC AND ANAEROBIC Blood Culture adequate volume   Culture   Final    NO GROWTH 4 DAYS Performed at [facility], [address], [place], [place] [address]    Report Status PENDING  Incomplete  MRSA Next Gen by PCR, Nasal     Status: None   Collection Time: 03/30/22  9:50 AM   Specimen: Nasal Mucosa; Nasal Swab  Result Value Ref Range Status   MRSA by PCR Next Gen NOT DETECTED NOT DETECTED Final    Comment: Performed at [facility], [address] [place], [place] [address]    Procedures and diagnostic studies:  ECHOCARDIOGRAM COMPLETE  Result Date: 03/30/2022    ECHOCARDIOGRAM REPORT   Patient Name:   Kerri Carter Date of Exam: 03/30/2022 Medical Rec #:  [id number]     Height:       59.0 in Accession #:    [id number]    Weight:       175.3 lb Date of Birth:  Dec 28, 1940     BSA:          1.744 m Patient Age:    4 years      BP:           125/66 mmHg Patient Gender: F             HR:           83 bpm. Exam Location:  [facility] Procedure: 2D Echo, Cardiac Doppler and Color Doppler Indications:     Mitral valve disorder I05.9  History:         Patient has prior history of Echocardiogram examinations, most                  recent 07/30/2021. CHF; COPD.  Sonographer:     Sherrie Sport Referring Phys:  [id number] CARMEN L GONZALEZ Diagnosing Phys: Kate Sable MD  Sonographer Comments: Technically challenging study due  to limited acoustic windows and no parasternal window. Image acquisition challenging due to COPD. IMPRESSIONS  1. Left ventricular ejection fraction, by estimation, is 55 to 60%. The left ventricle has normal function. The left ventricle has no regional wall motion abnormalities. Left ventricular diastolic parameters are consistent with Grade II diastolic dysfunction (pseudonormalization).  2. Right  ventricular systolic function is normal. The right ventricular size is normal.  3. The mitral valve is degenerative. Mild to moderate mitral valve regurgitation.  4. The aortic valve is calcified. Aortic valve regurgitation is mild to moderate. Mild to moderate aortic valve stenosis. Aortic valve area, by VTI measures 1.19 cm. Aortic valve mean gradient measures 20.7 mmHg. Aortic valve Vmax measures 3.03 m/s. FINDINGS  Left Ventricle: Left ventricular ejection fraction, by estimation, is 55 to 60%. The left ventricle has normal function. The left ventricle has no regional wall motion abnormalities. The left ventricular internal cavity size was normal in size. Suboptimal image quality limits for assessment of left ventricular hypertrophy. Left ventricular diastolic parameters are consistent with Grade II diastolic dysfunction (pseudonormalization). Right Ventricle: The right ventricular size is normal. Right vetricular wall thickness was not well visualized. Right ventricular systolic function is normal. Left Atrium: Left atrial size was normal in size. Right Atrium: Right atrial size was normal in size. Pericardium: There is no evidence of pericardial effusion. Mitral Valve: The mitral valve is degenerative in appearance. Mild to moderate mitral annular calcification. Mild to moderate mitral valve regurgitation. MV peak gradient, 6.5 mmHg. The mean mitral valve gradient is 3.0 mmHg. Tricuspid Valve: The tricuspid valve is grossly normal. Tricuspid valve regurgitation is not demonstrated. Aortic Valve: The aortic valve  is calcified. Aortic valve regurgitation is mild to moderate. Mild to moderate aortic stenosis is present. Aortic valve mean gradient measures 20.7 mmHg. Aortic valve peak gradient measures 36.6 mmHg. Aortic valve area, by VTI measures 1.19 cm. Pulmonic Valve: The pulmonic valve was not well visualized. Pulmonic valve regurgitation is not visualized. Aorta: The aortic root was not well visualized. Venous: The inferior vena cava was not well visualized. IAS/Shunts: No atrial level shunt detected by color flow Doppler.  LEFT VENTRICLE PLAX 2D LVIDd:         4.70 cm   Diastology LVIDs:         3.40 cm   LV e' medial:    7.62 cm/s LV PW:         0.90 cm   LV E/e' medial:  18.5 LV IVS:        0.80 cm   LV e' lateral:   7.51 cm/s LVOT diam:     2.00 cm   LV E/e' lateral: 18.8 LV SV:         80 LV SV Index:   46 LVOT Area:     3.14 cm  RIGHT VENTRICLE RV Basal diam:  3.10 cm RV Mid diam:    2.90 cm RV S prime:     10.90 cm/s TAPSE (M-mode): 2.1 cm LEFT ATRIUM           Index        RIGHT ATRIUM           Index LA diam:      3.30 cm 1.89 cm/m   RA Area:     11.40 cm LA Vol (A2C): 38.9 ml 22.31 ml/m  RA Volume:   23.30 ml  13.36 ml/m LA Vol (A4C): 86.8 ml 49.78 ml/m  AORTIC VALVE AV Area (Vmax):    1.18 cm AV Area (Vmean):   1.17 cm AV Area (VTI):     1.19 cm AV Vmax:           302.67 cm/s AV Vmean:          213.333 cm/s AV VTI:  0.674 m AV Peak Grad:      36.6 mmHg AV Mean Grad:      20.7 mmHg LVOT Vmax:         114.00 cm/s LVOT Vmean:        79.500 cm/s LVOT VTI:          0.255 m LVOT/AV VTI ratio: 0.38  AORTA Ao Root diam: 2.10 cm MITRAL VALVE                TRICUSPID VALVE MV Area (PHT): 2.75 cm     TR Peak grad:   27.2 mmHg MV Area VTI:   1.78 cm     TR Vmax:        261.00 cm/s MV Peak grad:  6.5 mmHg MV Mean grad:  3.0 mmHg     SHUNTS MV Vmax:       1.27 m/s     Systemic VTI:  0.26 m MV Vmean:      82.0 cm/s    Systemic Diam: 2.00 cm MV Decel Time: 276 msec MV E velocity: 141.00 cm/s MV A  velocity: 121.00 cm/s MV E/A ratio:  1.17 Kate Sable MD Electronically signed by Kate Sable MD Signature Date/Time: 03/30/2022/4:11:48 PM    Final                LOS: 4 days   Jennye Boroughs  [facility]   Pager on [email or web address]. If 7PM-7AM, please contact night-coverage at [email or web address]     04/01/2022, 11:24 AM

## 2022-04-01 NOTE — Inpatient Diabetes Management (Signed)
Inpatient Diabetes Program Recommendations  AACE/ADA: New Consensus Statement on Inpatient Glycemic Control (2015)  Target Ranges:  Prepandial:   less than 140 mg/dL      Peak postprandial:   less than 180 mg/dL (1-2 hours)      Critically ill patients:  140 - 180 mg/dL   Lab Results  Component Value Date   GLUCAP 262 (H) 04/01/2022   HGBA1C 6.1 (H) 03/31/2022    Review of Glycemic Control  Latest Reference Range & Units 04/01/22 07:16 04/01/22 11:05  Glucose-Capillary 70 - 99 mg/dL 180 (H) 262 (H)  (H): Data is abnormally high  Diabetes history: Pre-DM Outpatient Diabetes medications: None Current orders for Inpatient glycemic control: Novolog 0-9 units TID and 0-5 units QHS, Solumedrol 40 mg Q12H  Inpatient Diabetes Program Recommendations:    If appropriate and steroids continue, please consider:  Novolog 2-3 units TID with meals if she consumes at least 50%.    Will continue to follow while inpatient.  Thank you, Reche Dixon, MSN, [place] Diabetes Coordinator Inpatient Diabetes Program [phone] (team pager from 8a-5p)

## 2022-04-01 NOTE — Progress Notes (Signed)
Patient has increased WOB throughout the shift requiring increase to PRN dose of Morphine from q4h to q3h. One time dose of '20mg'$  lasix given in morning after rounds. Patient placed on 10L during meals. Patient requests to be placed back on BiPap after meals. Family at bedside throughout the day supporting patient. Urine output 2,025.

## 2022-04-02 ENCOUNTER — Ambulatory Visit: Payer: Medicare PPO | Admitting: Student in an Organized Health Care Education/Training Program

## 2022-04-02 DIAGNOSIS — I5033 Acute on chronic diastolic (congestive) heart failure: Secondary | ICD-10-CM

## 2022-04-02 DIAGNOSIS — J9621 Acute and chronic respiratory failure with hypoxia: Secondary | ICD-10-CM | POA: Diagnosis not present

## 2022-04-02 DIAGNOSIS — A419 Sepsis, unspecified organism: Secondary | ICD-10-CM | POA: Diagnosis not present

## 2022-04-02 DIAGNOSIS — J189 Pneumonia, unspecified organism: Secondary | ICD-10-CM | POA: Diagnosis not present

## 2022-04-02 DIAGNOSIS — Z7189 Other specified counseling: Secondary | ICD-10-CM | POA: Diagnosis not present

## 2022-04-02 LAB — BASIC METABOLIC PANEL
Anion gap: 10 (ref 5–15)
BUN: 39 mg/dL — ABNORMAL HIGH (ref 8–23)
CO2: 30 mmol/L (ref 22–32)
Calcium: 7.3 mg/dL — ABNORMAL LOW (ref 8.9–10.3)
Chloride: 101 mmol/L (ref 98–111)
Creatinine, Ser: 1.21 mg/dL — ABNORMAL HIGH (ref 0.44–1.00)
GFR, Estimated: 46 mL/min — ABNORMAL LOW (ref >60)
Glucose, Bld: 197 mg/dL — ABNORMAL HIGH (ref 70–99)
Potassium: 4.1 mmol/L (ref 3.5–5.1)
Sodium: 141 mmol/L (ref 135–145)

## 2022-04-02 LAB — MAGNESIUM: Magnesium: 2.7 mg/dL — ABNORMAL HIGH (ref 1.7–2.4)

## 2022-04-02 LAB — CULTURE, BLOOD (ROUTINE X 2)
Culture: NO GROWTH
Culture: NO GROWTH
Special Requests: ADEQUATE
Special Requests: ADEQUATE

## 2022-04-02 LAB — GLUCOSE, CAPILLARY
Glucose-Capillary: 202 mg/dL — ABNORMAL HIGH (ref 70–99)
Glucose-Capillary: 299 mg/dL — ABNORMAL HIGH (ref 70–99)
Glucose-Capillary: 218 mg/dL — ABNORMAL HIGH (ref 70–99)
Glucose-Capillary: 279 mg/dL — ABNORMAL HIGH (ref 70–99)

## 2022-04-02 MED ORDER — MORPHINE SULFATE (PF) 2 MG/ML IV SOLN
1.0000 mg | INTRAVENOUS | Status: DC | PRN
  Administered 2022-04-03 – 2022-04-04 (×2): 1 mg via INTRAVENOUS
  Filled 2022-04-02 (×2): qty 1

## 2022-04-02 MED ORDER — FUROSEMIDE 10 MG/ML IJ SOLN
40.0000 mg | Freq: Two times a day (BID) | INTRAMUSCULAR | Status: DC
  Administered 2022-04-02 – 2022-04-05 (×7): 40 mg via INTRAVENOUS
  Filled 2022-04-02 (×7): qty 4

## 2022-04-02 MED ORDER — METHYLPREDNISOLONE SODIUM SUCC 40 MG IJ SOLR
40.0000 mg | INTRAMUSCULAR | Status: DC
  Administered 2022-04-03 – 2022-04-04 (×2): 40 mg via INTRAVENOUS
  Filled 2022-04-02 (×2): qty 1

## 2022-04-02 NOTE — Inpatient Diabetes Management (Signed)
Inpatient Diabetes Program Recommendations  AACE/ADA: New Consensus Statement on Inpatient Glycemic Control (2015)  Target Ranges:  Prepandial:   less than 140 mg/dL      Peak postprandial:   less than 180 mg/dL (1-2 hours)      Critically ill patients:  140 - 180 mg/dL   Lab Results  Component Value Date   GLUCAP 218 (H) 04/02/2022   HGBA1C 6.1 (H) 03/31/2022    Review of Glycemic Control  Latest Reference Range & Units 04/01/22 07:16 04/01/22 11:05 04/01/22 16:47 04/01/22 21:06 04/02/22 07:26  Glucose-Capillary 70 - 99 mg/dL 180 (H) 262 (H) 202 (H) 299 (H) 218 (H)  (H): Data is abnormally high  Diabetes history: Pre-DM Outpatient Diabetes medications: None Current orders for Inpatient glycemic control: Novolog 0-9 units TID and 0-5 units QHS, Solumedrol 40 mg Q12H   Inpatient Diabetes Program Recommendations:     If appropriate and steroids continue, please consider:   Novolog 2-3 units TID with meals if she consumes at least 50%.     Will continue to follow while inpatient.   Thank you, Reche Dixon, MSN, [place] Diabetes Coordinator Inpatient Diabetes Program [phone] (team pager from 8a-5p)

## 2022-04-02 NOTE — Plan of Care (Signed)
  Problem: Clinical Measurements: Goal: Ability to maintain clinical measurements within normal limits will improve Outcome: Not Progressing Goal: Respiratory complications will improve Outcome: Not Progressing

## 2022-04-02 NOTE — Progress Notes (Signed)
Daily Progress Note   Patient Name: Kerri Carter       Date: 04/02/2022 DOB: 05-28-1940  Age: 82 y.o. MRN#: [id number] Attending Physician: Jennye Boroughs, MD Primary Care Physician: Carollee Leitz, MD Admit Date: 03/28/2022  Reason for Consultation/Follow-up: Establishing goals of care  Subjective: Notes and labs reviewed.  In to see patient.  Patient is currently resting in bed at this time.  She has improved from yesterday.  She is not on BiPAP, and is on regular nasal cannula with high flow rate.  She states she is feeling much better than she did.  She is hopeful for continued recovery.  Length of Stay: 5  Current Medications: Scheduled Meds:   amLODipine  5 mg Oral Daily   apixaban  5 mg Oral BID   budesonide (PULMICORT) nebulizer solution  0.5 mg Nebulization BID   busPIRone  5 mg Oral BID   Chlorhexidine Gluconate Cloth  6 each Topical Daily   cholecalciferol  4,000 Units Oral Daily   escitalopram  10 mg Oral Daily   famotidine  20 mg Oral QAC breakfast   ferrous sulfate  325 mg Oral Daily   furosemide  40 mg Intravenous BID   gabapentin  300 mg Oral Daily   And   gabapentin  600 mg Oral QHS   guaiFENesin  600 mg Oral BID   insulin aspart  0-5 Units Subcutaneous QHS   insulin aspart  0-9 Units Subcutaneous TID WC   ipratropium-albuterol  3 mL Nebulization Q4H   lamoTRIgine  100 mg Oral BID   lidocaine  1 patch Transdermal Daily   loratadine  10 mg Oral Daily   methylPREDNISolone (SOLU-MEDROL) injection  40 mg Intravenous Q12H   mirabegron ER  25 mg Oral Daily   montelukast  10 mg Oral Daily   multivitamin  1 tablet Oral Daily   nebivolol  5 mg Oral BID   pantoprazole  40 mg Oral BID AC   pravastatin  20 mg Oral q1800   QUEtiapine  25 mg Oral QHS   sucralfate  1 g  Oral BID    Continuous Infusions:  sodium chloride Stopped (03/31/22 0309)   ampicillin-sulbactam (UNASYN) IV 3 g (04/02/22 0908)    PRN Meds: sodium chloride, acetaminophen **OR** acetaminophen, albuterol, dicyclomine, fluticasone, magnesium hydroxide, methocarbamol, morphine injection,  ondansetron **OR** ondansetron (ZOFRAN) IV, sodium chloride, traZODone  Physical Exam Pulmonary:     Effort: Pulmonary effort is normal.  Neurological:     Mental Status: She is alert.             Vital Signs: BP 128/68   Pulse 71   Temp 98.3 F (36.8 C) (Axillary)   Resp 14   Ht '4\' 11"'$  (1.499 m)   Wt 80.7 kg   SpO2 95%   BMI 35.93 kg/m  SpO2: SpO2: 95 % O2 Device: O2 Device: High Flow Nasal Cannula (Bipap removed) O2 Flow Rate: O2 Flow Rate (L/min): 10 L/min  Intake/output summary:  Intake/Output Summary (Last 24 hours) at 04/02/2022 1138 Last data filed at 04/02/2022 [id number] Gross per 24 hour  Intake 620.07 ml  Output 2635 ml  Net -2014.93 ml   LBM: Last BM Date : 03/31/22 (per chart) Baseline Weight: Weight: 80 kg Most recent weight: Weight: 80.7 kg    Patient Active Problem List   Diagnosis Date Noted   Obesity (BMI 30-39.9) 03/31/2022   NSVT (nonsustained ventricular tachycardia) (HCC) 03/30/2022   Acute urinary retention 03/30/2022   Sepsis due to pneumonia ([place]) 03/28/2022   Demand ischemia 03/28/2022   History of major orthopedic surgery 03/19/2022   Iron deficiency anemia due to chronic blood loss 10/09/2021   Chronic heart failure with preserved ejection fraction ([place]) 09/24/2021   Acute on chronic respiratory failure with hypoxia and hypercapnia (HCC) 09/10/2021   Acute on chronic diastolic CHF (congestive heart failure) (HCC) 08/20/2021   Paroxysmal atrial fibrillation ([place]) 07/29/2021   History of lumbar fusion 05/23/2021   Impaired mobility 12/18/2020   Urinary incontinence 12/18/2020   Lumbar radiculopathy 12/11/2020   Chronic pain of both shoulders 08/29/2019    Hypertriglyceridemia 07/24/2019   Bipolar disorder, in full remission, most recent episode depressed ([place]) 07/13/2019   Essential hypertension 06/16/2019   Chronic pain 06/16/2019   Lymphedema 06/05/2019   Chronic venous insufficiency 05/25/2019   PAD (peripheral artery disease) ([place]) 05/25/2019   Interstitial pulmonary disease ([place]) 07/07/2018   Leukocytosis 10/19/2017   Allergic rhinitis 09/28/2017   Age-related osteoporosis without current pathological fracture 11/05/2016   Peripheral neuropathy 10/28/2016   Osteoporosis 08/27/2016   Drug-induced osteoporosis 08/27/2016   Adnexal mass 08/25/2016   Prediabetes 08/25/2016   Coronary atherosclerosis 08/25/2016   Stage 3b chronic kidney disease ([place])    Hyperlipidemia 07/17/2015   Macular degeneration 07/17/2015   Rheumatoid arthritis ([place]) 12/05/2014   Osteoarthritis of knee 06/07/2013   Valvular heart disease 10/13/2011   COPD with acute exacerbation ([place]) 09/09/2011   OSA (obstructive sleep apnea) 09/09/2011   Gastroesophageal reflux disease 02/24/2011   Single kidney 02/24/2011   Renal artery stenosis ([place]) 02/24/2011    Palliative Care Assessment & Plan   Recommendations/Plan: Continue current care.  Code Status:    Code Status Orders  (From admission, onward)           Start     Ordered   03/28/22 0250  Do not attempt resuscitation (DNR)  Continuous       Question Answer Comment  If patient has no pulse and is not breathing Do Not Attempt Resuscitation   If patient has a pulse and/or is breathing: Medical Treatment Goals LIMITED ADDITIONAL INTERVENTIONS: Use medication/IV fluids and cardiac monitoring as indicated; Do not use intubation or mechanical ventilation (DNI), also provide comfort medications.  Transfer to Progressive/Stepdown as indicated, avoid Intensive Care.   Consent: Discussion documented in EHR or  advanced directives reviewed      03/28/22 0252           Code Status History     Date  Active Date Inactive Code Status Order ID Comments User Context   09/08/2021 0912 09/12/2021 2210 DNR [id number]  Collier Bullock, MD ED   08/20/2021 0947 08/22/2021 2237 DNR [id number]  Norval Morton, MD ED   07/28/2021 1805 07/31/2021 2134 DNR [id number]  Neena Rhymes, MD ED   07/22/2021 2029 07/24/2021 2142 DNR [id number]  Cox, [place], DO ED   05/27/2021 1832 06/02/2021 2105 DNR [id number]  Flora Lipps, MD Inpatient   05/23/2021 1306 05/27/2021 1832 Full Code [id number]  Loleta Dicker, [place] Inpatient   01/12/2021 2243 01/13/2021 2354 Full Code [id number]  Para Skeans, MD ED   12/11/2020 1422 12/13/2020 2016 Full Code [id number]  Loleta Dicker, [place] Inpatient   10/29/2020 2232 11/04/2020 1947 Full Code [id number]  Mansy, Arvella Merles, MD ED   12/21/2019 1736 12/26/2019 1707 Full Code [id number]  Corky Mull, MD Inpatient   07/26/2018 1110 07/28/2018 1750 Full Code [id number]  Corky Mull, MD Inpatient   11/04/2017 1231 11/05/2017 2155 Full Code [id number]  Corky Mull, MD Inpatient   10/31/2016 2128 11/04/2016 1655 Full Code [id number]  Fritzi Mandes, MD Inpatient   05/07/2016 0944 05/14/2016 2144 Full Code [id number]  Harrie Foreman Inpatient   09/26/2015 2259 09/27/2015 2044 Full Code [id number]  Lance Coon, MD ED      Advance Directive Documentation    Flowsheet Row Most Recent Value  Type of Advance Directive Living will, Healthcare Power of Attorney  Pre-existing out of facility DNR order (yellow form or pink MOST form) --  "MOST" Form in Place? --       Prognosis: Poor overall    Thank you for allowing the Palliative Medicine Team to assist in the care of this patient.   Asencion Gowda, NP  Please contact Palliative Medicine Team phone at [phone] for questions and concerns.

## 2022-04-02 NOTE — Progress Notes (Addendum)
Progress Note    Kerri Carter  [id number] DOB: 1940/05/18  DOA: 03/28/2022 PCP: Carollee Leitz, MD      Brief Narrative:    Medical records reviewed and are as summarized below:  Kerri Carter is a 82 y.o. female  with medical history significant of chronic respiratory failure with hypoxia on 3 L/min O2 at baseline, COPD on chronic prednisone, OSA, interstitial lung disease, chronic diastolic CHF, hypertension, paroxysmal A-fib, valvular heart disease, PAD, chronic venous insufficiency, renal artery stenosis, GERD, RA, peripheral neuropathy, lumbar radiculopathy, CKD stage IIIb, chronic leukocytosis, bipolar disorder, chronic pain with lumbar radiculopathy.  She presented to the ED on the evening of 03/27/2022 for evaluation of worsening shortness of breath and in respiratory distress.     She was found to have acute on chronic hypoxic respiratory failure due to multifocal pneumonia and secondary COPD exacerbation.  Started on IV antibiotics and IV steroids and admitted to the hospital.  2/25: remains on 4 L/min O2 with sats 91-92%. Ongoing dyspnea at rest and with conversation.  2/26: rapid response was called on 2/26 because of increased O2 needs, pt unresponsive with increased WOB.  Placed on BiPAP, transferred to stepdown.  ABG with pCO2 73, pH 7.1.  Pulmonology consulted.       Assessment/Plan:   Principal Problem:   Sepsis due to pneumonia [facility]) Active Problems:   COPD with acute exacerbation ([place])   Acute on chronic respiratory failure with hypoxia and hypercapnia (HCC)   NSVT (nonsustained ventricular tachycardia) (HCC)   Stage 3b chronic kidney disease (HCC)   Interstitial pulmonary disease (HCC)   Paroxysmal atrial fibrillation (HCC)   Chronic heart failure with preserved ejection fraction (HCC)   Demand ischemia   Acute on chronic diastolic CHF (congestive heart failure) (HCC)   Essential hypertension   Rheumatoid arthritis (HCC)   History of lumbar  fusion   Hyperlipidemia   Gastroesophageal reflux disease   OSA (obstructive sleep apnea)   Valvular heart disease   Peripheral neuropathy   Chronic venous insufficiency   PAD (peripheral artery disease) (HCC)   Bipolar disorder, in full remission, most recent episode depressed (HCC)   Chronic pain   Lumbar radiculopathy   Urinary incontinence   Iron deficiency anemia due to chronic blood loss   Acute urinary retention   Obesity (BMI 30-39.9)    Body mass index is 35.93 kg/m.  (Obesity)   Sepsis secondary to pneumonia: Plan to complete 6 days of IV Unasyn today.  Completed IV azithromycin.   Acute on chronic hypoxic respiratory failure, acute hypercapnic respiratory failure: She was taken off BiPAP this morning.  She is on 10 L/min oxygen via HFNC.  Wean down oxygen as able.  Obtain ABG once stable off BiPAP to determine need for trilogy machine at discharge   COPD exacerbation: Continue IV steroids and bronchodilators.   Acute on chronic diastolic CHF, valvular heart disease (moderate to severe MR, moderate TR, mild to moderate AS): Hold torsemide for now.  Start IV Lasix 40 mg twice daily.  BNP was 933 on 03/30/2022.   2D echo in June 2023 showed EF estimated at 55 to 123456, grade 1 diastolic dysfunction   Elevated troponin: This is likely due to demand ischemia/myocardial injury from sepsis and respiratory failure   Paroxysmal atrial fibrillation: Continue Eliquis   Acute urinary retention, history of urinary incontinence: Foley catheter placed on 03/30/2022.  At that time, bladder scan showed greater than 800 mL of urine.  Other comorbidities include interstitial lung disease, CKD stage IIIb with single left kidney, hypertension, iron deficiency anemia, rheumatoid arthritis, lumbar radiculopathy, bipolar disorder, PVD, chronic venous insufficiency, OSA (on CPAP  is old and dysfunctional)    Diet Order             Diet Heart Room service appropriate? Yes; Fluid  consistency: Thin  Diet effective now                            Consultants: Pulmonologist, palliative care  Procedures: None    Medications:    amLODipine  5 mg Oral Daily   apixaban  5 mg Oral BID   budesonide (PULMICORT) nebulizer solution  0.5 mg Nebulization BID   busPIRone  5 mg Oral BID   Chlorhexidine Gluconate Cloth  6 each Topical Daily   cholecalciferol  4,000 Units Oral Daily   escitalopram  10 mg Oral Daily   famotidine  20 mg Oral QAC breakfast   ferrous sulfate  325 mg Oral Daily   furosemide  40 mg Intravenous BID   gabapentin  300 mg Oral Daily   And   gabapentin  600 mg Oral QHS   guaiFENesin  600 mg Oral BID   insulin aspart  0-5 Units Subcutaneous QHS   insulin aspart  0-9 Units Subcutaneous TID WC   ipratropium-albuterol  3 mL Nebulization Q4H   lamoTRIgine  100 mg Oral BID   lidocaine  1 patch Transdermal Daily   loratadine  10 mg Oral Daily   methylPREDNISolone (SOLU-MEDROL) injection  40 mg Intravenous Q12H   mirabegron ER  25 mg Oral Daily   montelukast  10 mg Oral Daily   multivitamin  1 tablet Oral Daily   nebivolol  5 mg Oral BID   pantoprazole  40 mg Oral BID AC   pravastatin  20 mg Oral q1800   QUEtiapine  25 mg Oral QHS   sucralfate  1 g Oral BID   Continuous Infusions:  sodium chloride Stopped (03/31/22 0309)   ampicillin-sulbactam (UNASYN) IV 3 g (04/02/22 0908)     Anti-infectives (From admission, onward)    Start     Dose/Rate Route Frequency Ordered Stop   03/29/22 0600  azithromycin (ZITHROMAX) 500 mg in sodium chloride 0.9 % 250 mL IVPB        500 mg 250 mL/hr over 60 Minutes Intravenous Every 24 hours 03/28/22 0252 04/01/22 0609   03/28/22 1200  Ampicillin-Sulbactam (UNASYN) 3 g in sodium chloride 0.9 % 100 mL IVPB  Status:  Discontinued        3 g 200 mL/hr over 30 Minutes Intravenous Every 12 hours 03/28/22 0252 03/28/22 0756   03/28/22 0900  Ampicillin-Sulbactam (UNASYN) 3 g in sodium chloride 0.9 %  100 mL IVPB        3 g 200 mL/hr over 30 Minutes Intravenous Every 6 hours 03/28/22 0756     03/28/22 0145  Ampicillin-Sulbactam (UNASYN) 3 g in sodium chloride 0.9 % 100 mL IVPB        3 g 200 mL/hr over 30 Minutes Intravenous  Once 03/28/22 0141 03/28/22 0210   03/28/22 0145  azithromycin (ZITHROMAX) 500 mg in sodium chloride 0.9 % 250 mL IVPB        500 mg 250 mL/hr over 60 Minutes Intravenous  Once 03/28/22 0141 03/28/22 0419              Family Communication/Anticipated D/C  date and plan/Code Status   DVT prophylaxis:  apixaban (ELIQUIS) tablet 5 mg     Code Status: DNR  Family Communication: Plan discussed with Scientist, physiological, son, over the phone Disposition Plan: Plan to discharge home when medically stable   Status is: Inpatient Remains inpatient appropriate because: Acute respiratory failure       Subjective:   Interval events noted.  Breathing is better today.  No chest pain.  She is tolerating 2 L/min oxygen via HFNC.  Rodman Key, RN, was at the bedside  Objective:    Vitals:   04/02/22 0646 04/02/22 0647 04/02/22 0757 04/02/22 1144  BP:      Pulse: 72 74 71 76  Resp: '12 17 14 '$ (!) 23  Temp:      TempSrc:      SpO2: 95% 95%    Weight:      Height:       No data found.   Intake/Output Summary (Last 24 hours) at 04/02/2022 1207 Last data filed at 04/02/2022 [id number] Gross per 24 hour  Intake 620.07 ml  Output 2485 ml  Net -1864.93 ml   Filed Weights   03/31/22 0500 04/01/22 0500 04/02/22 0500  Weight: 83.6 kg 84.8 kg 80.7 kg    Exam:    GEN: NAD SKIN: Warm and dry EYES: No pallor or icterus ENT: MMM CV: RRR PULM: Decreased air entry bilaterally, bibasilar rales, bilateral wheezing ABD: soft, ND, NT, +BS CNS: AAO x 3, non focal EXT: No edema or tenderness GU: Foley catheter draining amber urine     Data Reviewed:   I have personally reviewed following labs and imaging studies:  Labs: Labs show the following:   Basic Metabolic  Panel: Recent Labs  Lab 03/29/22 0445 03/30/22 0428 03/31/22 0317 04/01/22 0504 04/02/22 0428  NA 137 140 141 141 141  K 4.0 3.8 3.9 4.2 4.1  CL 103 104 103 102 101  CO2 '26 28 27 29 30  '$ GLUCOSE 183* 150* 193* 191* 197*  BUN 29* 34* 34* 37* 39*  CREATININE 1.29* 1.28* 1.37* 1.46* 1.21*  CALCIUM 8.2* 7.9* 7.3* 7.3* 7.3*  MG 2.0  --  2.3  --  2.7*   GFR Estimated Creatinine Clearance: 34.6 mL/min (A) (by C-G formula based on SCr of 1.21 mg/dL (H)). Liver Function Tests: Recent Labs  Lab 03/28/22 0104  AST 27  ALT 16  ALKPHOS 122  BILITOT 0.6  PROT 7.4  ALBUMIN 3.1*   No results for input(s): "LIPASE", "AMYLASE" in the last 168 hours. No results for input(s): "AMMONIA" in the last 168 hours. Coagulation profile Recent Labs  Lab 03/28/22 0545  INR 1.3*    CBC: Recent Labs  Lab 03/28/22 0104 03/28/22 0545 03/29/22 0445 03/30/22 0428 03/31/22 0317 04/01/22 0504  WBC 17.4* 17.2* 19.9* 20.8* 15.7* 16.5*  NEUTROABS 12.6*  --   --   --   --   --   HGB 10.5* 9.8* 9.1* 9.1* 9.1* 9.6*  HCT 34.2* 31.2* 29.1* 28.9* 29.6* 30.6*  MCV 90.2 88.4 88.2 89.5 90.0 89.5  PLT 397 317 345 383 367 393   Cardiac Enzymes: No results for input(s): "CKTOTAL", "CKMB", "CKMBINDEX", "TROPONINI" in the last 168 hours. BNP (last 3 results) No results for input(s): "PROBNP" in the last 8760 hours. CBG: Recent Labs  Lab 04/01/22 1105 04/01/22 1647 04/01/22 2106 04/02/22 0726 04/02/22 1129  GLUCAP 262* 202* 299* 218* 279*   D-Dimer: No results for input(s): "DDIMER" in the last 72 hours. Hgb  A1c: Recent Labs    03/31/22 0308  HGBA1C 6.1*   Lipid Profile: No results for input(s): "CHOL", "HDL", "LDLCALC", "TRIG", "CHOLHDL", "LDLDIRECT" in the last 72 hours. Thyroid function studies: No results for input(s): "TSH", "T4TOTAL", "T3FREE", "THYROIDAB" in the last 72 hours.  Invalid input(s): "FREET3" Anemia work up: No results for input(s): "VITAMINB12", "FOLATE", "FERRITIN",  "TIBC", "IRON", "RETICCTPCT" in the last 72 hours. Sepsis Labs: Recent Labs  Lab 03/28/22 0104 03/28/22 0105 03/28/22 0545 03/29/22 0445 03/30/22 0428 03/30/22 0908 03/31/22 0317 04/01/22 0504  PROCALCITON <0.10  --  0.76  --   --  0.80  --   --   WBC 17.4*  --  17.2* 19.9* 20.8*  --  15.7* 16.5*  LATICACIDVEN  --  2.2* 1.4  --   --   --   --   --     Microbiology Recent Results (from the past 240 hour(s))  Resp panel by RT-PCR (RSV, Flu A&B, Covid) Anterior Nasal Swab     Status: None   Collection Time: 03/28/22  1:03 AM   Specimen: Anterior Nasal Swab  Result Value Ref Range Status   SARS Coronavirus 2 by RT PCR NEGATIVE NEGATIVE Final    Comment: (NOTE) SARS-CoV-2 target nucleic acids are NOT DETECTED.  The SARS-CoV-2 RNA is generally detectable in upper respiratory specimens during the acute phase of infection. The lowest concentration of SARS-CoV-2 viral copies this assay can detect is 138 copies/mL. A negative result does not preclude SARS-Cov-2 infection and should not be used as the sole basis for treatment or other patient management decisions. A negative result may occur with  improper specimen collection/handling, submission of specimen other than nasopharyngeal swab, presence of viral mutation(s) within the areas targeted by this assay, and inadequate number of viral copies(<138 copies/mL). A negative result must be combined with clinical observations, patient history, and epidemiological information. The expected result is Negative.  Fact Sheet for Patients:  EntrepreneurPulse.com.au  Fact Sheet for Healthcare Providers:  IncredibleEmployment.be  This test is no t yet approved or cleared by the [place] FDA and  has been authorized for detection and/or diagnosis of SARS-CoV-2 by FDA under an Emergency Use Authorization (EUA). This EUA will remain  in effect (meaning this test can be used) for the duration of  the COVID-19 declaration under Section 564(b)(1) of the Act, 21 U.S.C.section 360bbb-3(b)(1), unless the authorization is terminated  or revoked sooner.       Influenza A by PCR NEGATIVE NEGATIVE Final   Influenza B by PCR NEGATIVE NEGATIVE Final    Comment: (NOTE) The Xpert Xpress SARS-CoV-2/FLU/RSV plus assay is intended as an aid in the diagnosis of influenza from Nasopharyngeal swab specimens and should not be used as a sole basis for treatment. Nasal washings and aspirates are unacceptable for Xpert Xpress SARS-CoV-2/FLU/RSV testing.  Fact Sheet for Patients: EntrepreneurPulse.com.au  Fact Sheet for Healthcare Providers: IncredibleEmployment.be  This test is not yet approved or cleared by the [place] FDA and has been authorized for detection and/or diagnosis of SARS-CoV-2 by FDA under an Emergency Use Authorization (EUA). This EUA will remain in effect (meaning this test can be used) for the duration of the COVID-19 declaration under Section 564(b)(1) of the Act, 21 U.S.C. section 360bbb-3(b)(1), unless the authorization is terminated or revoked.     Resp Syncytial Virus by PCR NEGATIVE NEGATIVE Final    Comment: (NOTE) Fact Sheet for Patients: EntrepreneurPulse.com.au  Fact Sheet for Healthcare Providers: IncredibleEmployment.be  This test is not  yet approved or cleared by the [place] and has been authorized for detection and/or diagnosis of SARS-CoV-2 by FDA under an Emergency Use Authorization (EUA). This EUA will remain in effect (meaning this test can be used) for the duration of the COVID-19 declaration under Section 564(b)(1) of the Act, 21 U.S.C. section 360bbb-3(b)(1), unless the authorization is terminated or revoked.  Performed at [facility], [address] [place], [place] [address]   Culture, blood (routine x 2)     Status: None   Collection Time:  03/28/22  1:03 AM   Specimen: BLOOD  Result Value Ref Range Status   Specimen Description BLOOD BLOOD RIGHT FOREARM  Final   Special Requests   Final    BOTTLES DRAWN AEROBIC AND ANAEROBIC Blood Culture adequate volume   Culture   Final    NO GROWTH 5 DAYS Performed at [facility], [address], [place], [place] [address]    Report Status 04/02/2022 FINAL  Final  Culture, blood (routine x 2)     Status: None   Collection Time: 03/28/22  1:05 AM   Specimen: BLOOD  Result Value Ref Range Status   Specimen Description BLOOD RIGHT ANTECUBITAL  Final   Special Requests   Final    BOTTLES DRAWN AEROBIC AND ANAEROBIC Blood Culture adequate volume   Culture   Final    NO GROWTH 5 DAYS Performed at [facility], [address], [place], [place] [address]    Report Status 04/02/2022 FINAL  Final  MRSA Next Gen by PCR, Nasal     Status: None   Collection Time: 03/30/22  9:50 AM   Specimen: Nasal Mucosa; Nasal Swab  Result Value Ref Range Status   MRSA by PCR Next Gen NOT DETECTED NOT DETECTED Final    Comment: Performed at [facility], [address] [place],  [address]    Procedures and diagnostic studies:  No results found.             LOS: 5 days   Nohea Kras  [facility]   Pager on [email or web address]. If 7PM-7AM, please contact night-coverage at [email or web address]     04/02/2022, 12:07 PM

## 2022-04-03 DIAGNOSIS — J9621 Acute and chronic respiratory failure with hypoxia: Secondary | ICD-10-CM | POA: Diagnosis not present

## 2022-04-03 DIAGNOSIS — J189 Pneumonia, unspecified organism: Secondary | ICD-10-CM | POA: Diagnosis not present

## 2022-04-03 DIAGNOSIS — I5033 Acute on chronic diastolic (congestive) heart failure: Secondary | ICD-10-CM | POA: Diagnosis not present

## 2022-04-03 DIAGNOSIS — Z7189 Other specified counseling: Secondary | ICD-10-CM | POA: Diagnosis not present

## 2022-04-03 DIAGNOSIS — A419 Sepsis, unspecified organism: Secondary | ICD-10-CM | POA: Diagnosis not present

## 2022-04-03 LAB — GLUCOSE, CAPILLARY
Glucose-Capillary: 224 mg/dL — ABNORMAL HIGH (ref 70–99)
Glucose-Capillary: 173 mg/dL — ABNORMAL HIGH (ref 70–99)
Glucose-Capillary: 141 mg/dL — ABNORMAL HIGH (ref 70–99)
Glucose-Capillary: 225 mg/dL — ABNORMAL HIGH (ref 70–99)

## 2022-04-03 LAB — CBC WITH DIFFERENTIAL/PLATELET
Abs Immature Granulocytes: 0.64 10*3/uL — ABNORMAL HIGH (ref 0.00–0.07)
Basophils Absolute: 0.1 10*3/uL (ref 0.0–0.1)
Basophils Relative: 1 %
Eosinophils Absolute: 0.1 10*3/uL (ref 0.0–0.5)
Eosinophils Relative: 1 %
HCT: 32.4 % — ABNORMAL LOW (ref 36.0–46.0)
Hemoglobin: 10.1 g/dL — ABNORMAL LOW (ref 12.0–15.0)
Immature Granulocytes: 5 %
Lymphocytes Relative: 16 %
Lymphs Abs: 2.4 10*3/uL (ref 0.7–4.0)
MCH: 28 pg (ref 26.0–34.0)
MCHC: 31.2 g/dL (ref 30.0–36.0)
MCV: 89.8 fL (ref 80.0–100.0)
Monocytes Absolute: 1 10*3/uL (ref 0.1–1.0)
Monocytes Relative: 7 %
Neutro Abs: 10.1 10*3/uL — ABNORMAL HIGH (ref 1.7–7.7)
Neutrophils Relative %: 70 %
Platelets: 339 10*3/uL (ref 150–400)
RBC: 3.61 MIL/uL — ABNORMAL LOW (ref 3.87–5.11)
RDW: 15.4 % (ref 11.5–15.5)
WBC: 14.4 10*3/uL — ABNORMAL HIGH (ref 4.0–10.5)
nRBC: 0.1 % (ref 0.0–0.2)

## 2022-04-03 LAB — BLOOD GAS, ARTERIAL
Acid-Base Excess: 11.5 mmol/L — ABNORMAL HIGH (ref 0.0–2.0)
Bicarbonate: 36.6 mmol/L — ABNORMAL HIGH (ref 20.0–28.0)
O2 Content: 6 L/min
O2 Saturation: 97.4 %
Patient temperature: 37
pCO2 arterial: 48 mmHg (ref 32–48)
pH, Arterial: 7.49 — ABNORMAL HIGH (ref 7.35–7.45)
pO2, Arterial: 78 mmHg — ABNORMAL LOW (ref 83–108)

## 2022-04-03 LAB — BASIC METABOLIC PANEL
Anion gap: 10 (ref 5–15)
BUN: 30 mg/dL — ABNORMAL HIGH (ref 8–23)
CO2: 30 mmol/L (ref 22–32)
Calcium: 7.3 mg/dL — ABNORMAL LOW (ref 8.9–10.3)
Chloride: 100 mmol/L (ref 98–111)
Creatinine, Ser: 1.2 mg/dL — ABNORMAL HIGH (ref 0.44–1.00)
GFR, Estimated: 46 mL/min — ABNORMAL LOW (ref >60)
Glucose, Bld: 138 mg/dL — ABNORMAL HIGH (ref 70–99)
Potassium: 3.7 mmol/L (ref 3.5–5.1)
Sodium: 140 mmol/L (ref 135–145)

## 2022-04-03 LAB — MAGNESIUM: Magnesium: 2.3 mg/dL (ref 1.7–2.4)

## 2022-04-03 IMAGING — MR MR LUMBAR SPINE W/O CM
5 series · 30 of 48 positions shown · non-contrast
Comparison: 07/26/2020

CLINICAL DATA: Chronic low back pain

EXAM:
MRI LUMBAR SPINE WITHOUT CONTRAST
TECHNIQUE: Multiplanar, multisequence MR imaging of the lumbar spine was
performed. No intravenous contrast was administered.

[Series 5: T2 · sagittal · 4.0mm · 0.81mm/px · 6 of 17 slices shown (1 of 2)]
[im 1/17]
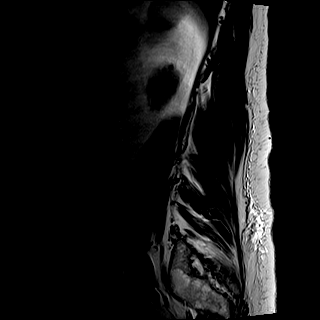
[im 4/17]
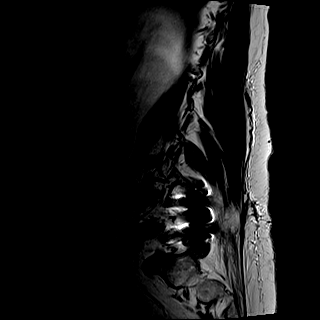
[im 7/17]
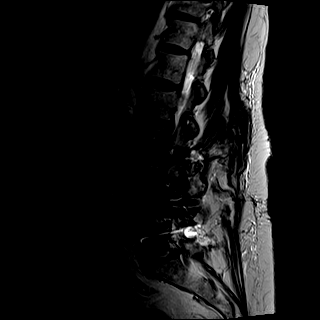
[im 10/17]
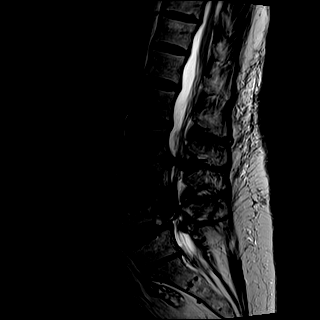
[im 13/17]
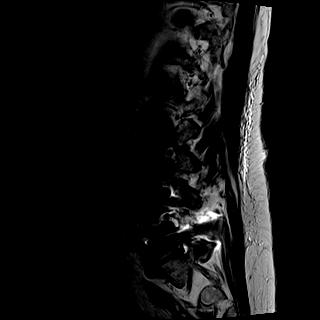
[im 17/17]
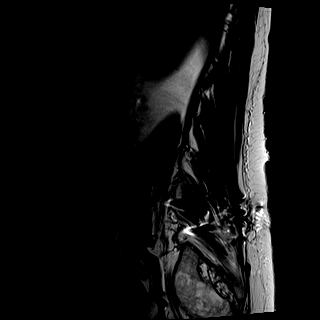

[Series 6: T1 · sagittal · 4.0mm · 0.81mm/px · 7 of 17 slices shown (1 of 2)]
[im 1/17]
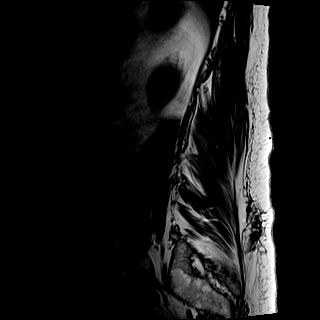
[im 3/17]
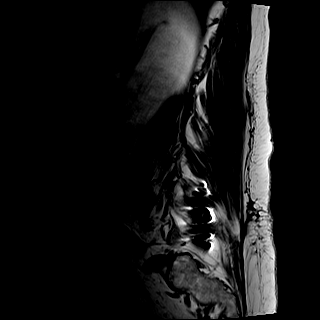
[im 6/17]
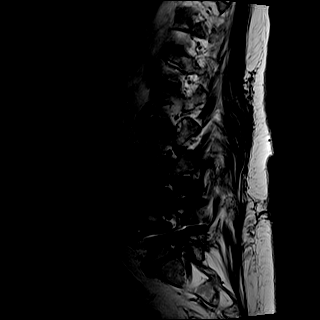
[im 9/17]
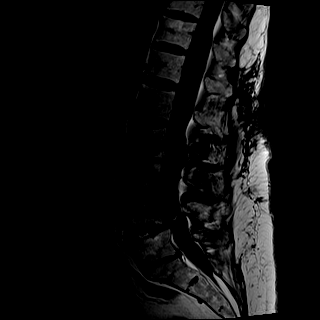
[im 11/17]
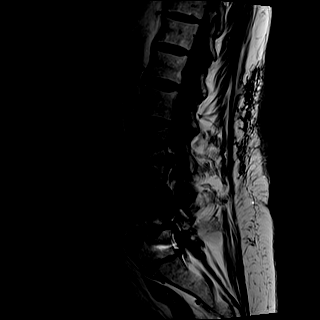
[im 14/17]
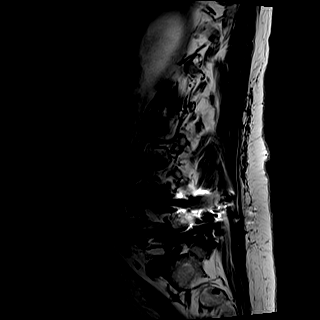
[im 17/17]
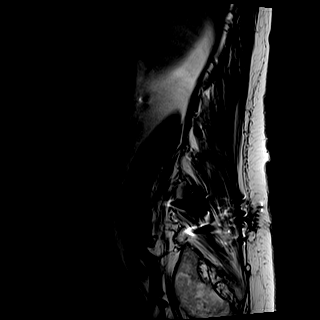

[Series 7: STIR · sagittal · 4.0mm · 0.41mm/px · 1 of 17 slices shown]
[im 1/17]
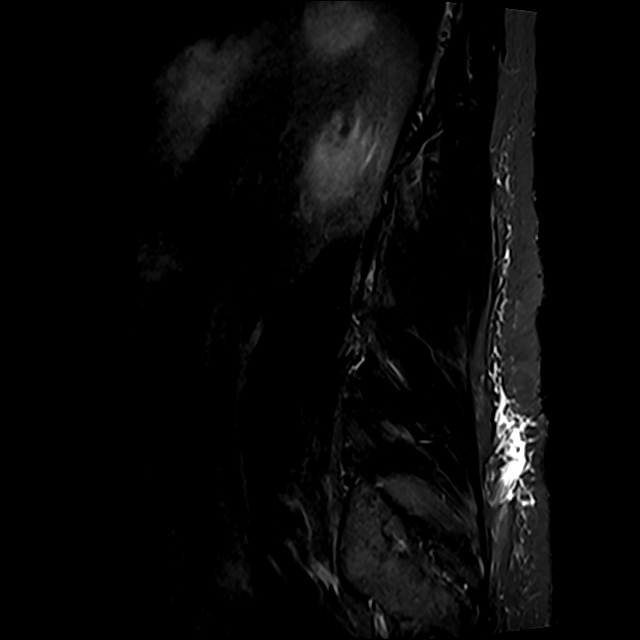

[Series 8: T2 · axial · 4.0mm · 0.78mm/px · z∈[-32,+166]mm · 8 of 33 slices shown (2 of 2)]
[im 1/33]
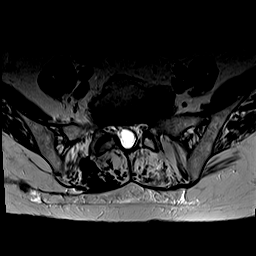
[im 5/33]
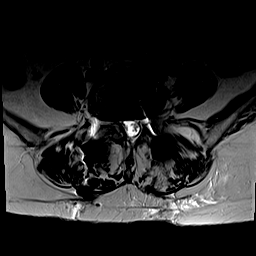
[im 10/33]
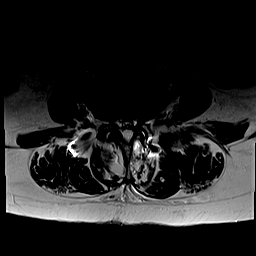
[im 15/33]
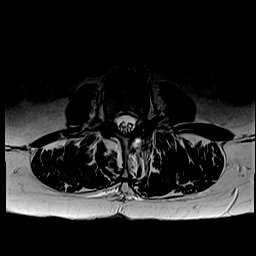
[im 18/33]
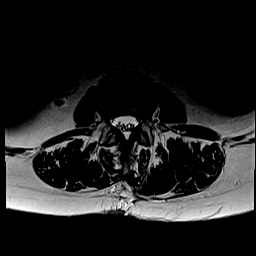
[im 23/33]
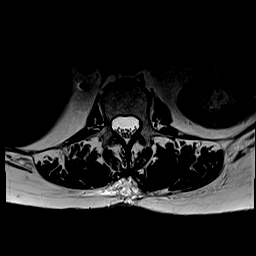
[im 28/33]
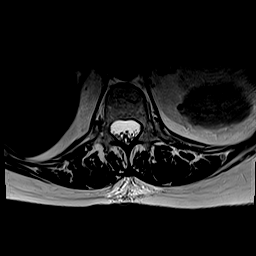
[im 33/33]
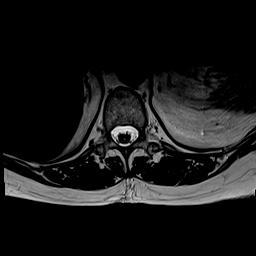

[Series 9: T1 · axial · 4.0mm · 0.39mm/px · z∈[-32,+166]mm · 8 of 33 slices shown (2 of 2)]
[im 1/33]
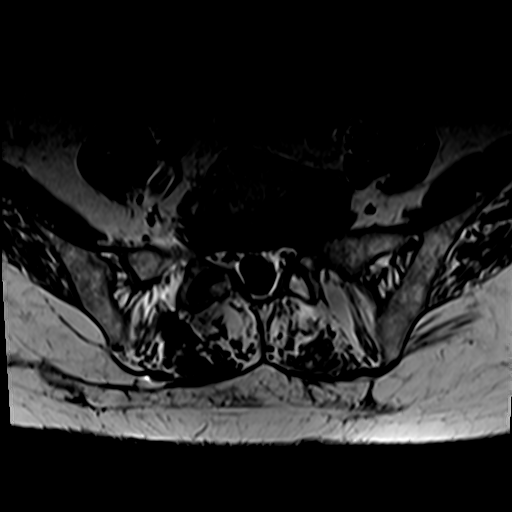
[im 5/33]
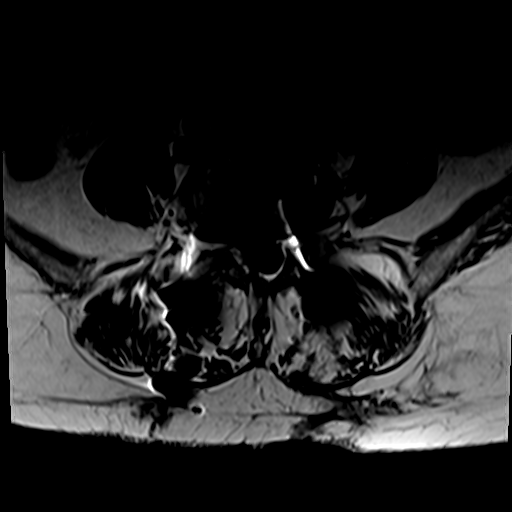
[im 10/33]
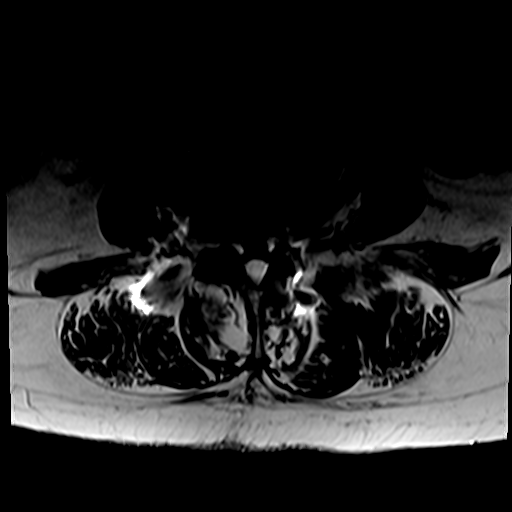
[im 15/33]
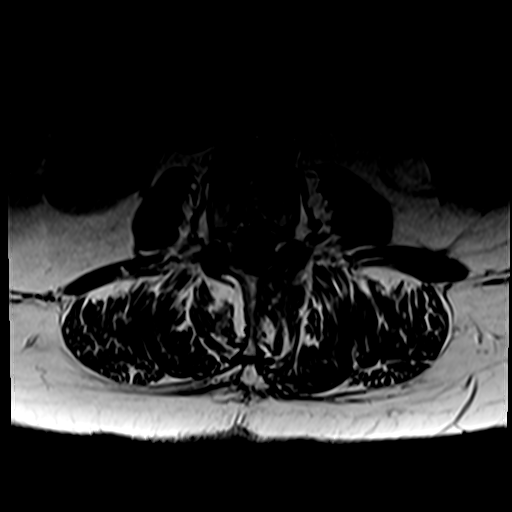
[im 18/33]
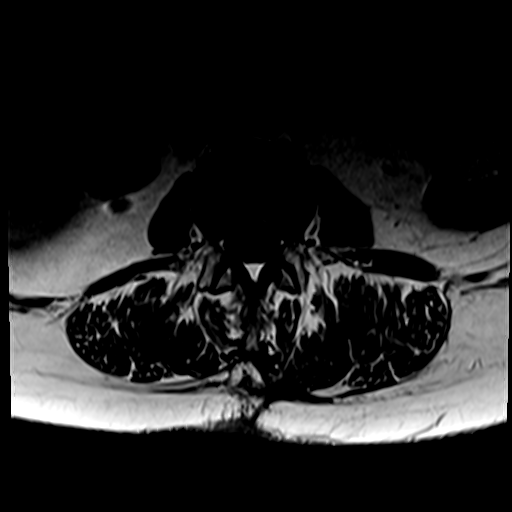
[im 23/33]
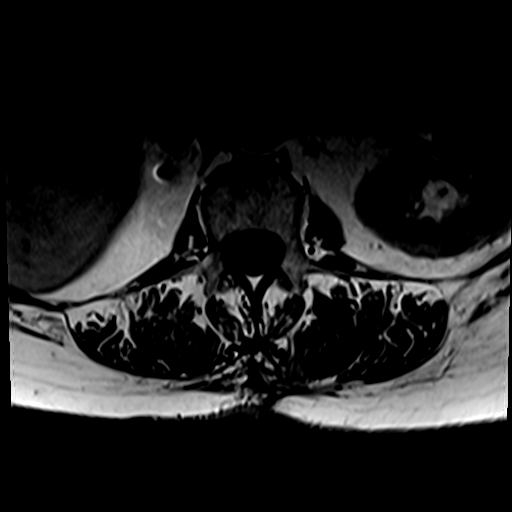
[im 28/33]
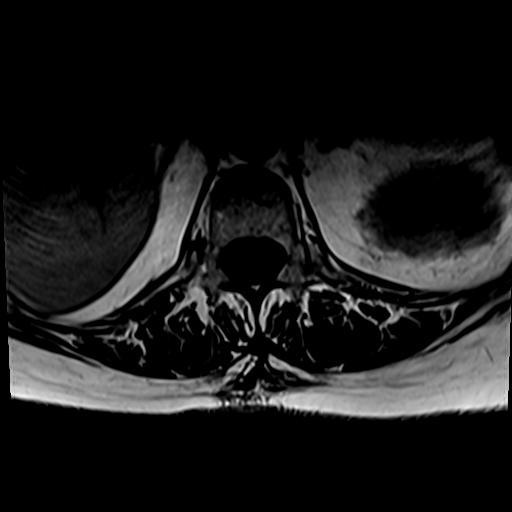
[im 33/33]
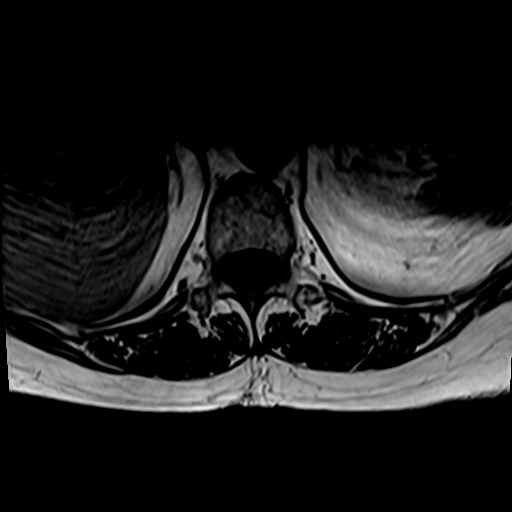

[30 of 48 positions shown; findings below may reference images not displayed]

FINDINGS: Segmentation:  Standard.

Alignment: Minimal grade 1 anterolisthesis of L4 on L5. 2 mm
retrolisthesis of L3 on L4.

Vertebrae: No acute fracture, evidence of discitis, or aggressive
bone lesion.

Conus medullaris and cauda equina: Conus extends to the T11-12
level. Conus and cauda equina appear normal.

Paraspinal and other soft tissues: No acute paraspinal abnormality.

Disc levels:

Disc spaces: Degenerative disease with severe disc height loss at
L2-3, L3-4 and L5-S1. Posterior lumbar interbody fusion at L4-5.

T12-L1: Minimal broad-based disc bulge. No foraminal or central
canal stenosis.

L1-L2: Mild broad-based disc bulge. No foraminal or central canal
stenosis.

L2-L3: Left laminectomy. Broad-based disc bulge with a left
paracentral disc protrusion with mass effect on the left intraspinal
L3 nerve roots. Mild left foraminal stenosis. Mild spinal stenosis.
Mild bilateral facet arthropathy.

L3-L4: Broad-based disc bulge with a large broad central disc
protrusion and a large right paracentral disc extrusion with caudal
migration of disc material resulting in mass effect upon the right
intraspinal L4 nerve roots. Mild bilateral facet arthropathy. Severe
multifactorial spinal stenosis. Moderate bilateral foraminal
stenosis.

L4-L5: Posterior lumbar interbody fusion with a broad disc bulge. No
foraminal or central canal stenosis.

L5-S1: Broad-based disc bulge. Moderate bilateral facet arthropathy.
Moderate bilateral foraminal stenosis.
IMPRESSION: 1. At L2-3 there is a left laminectomy. Broad-based disc bulge with
a left paracentral disc protrusion with mass effect on the left
intraspinal L3 nerve roots. Mild left foraminal stenosis. Mild
spinal stenosis. Mild bilateral facet arthropathy.
2. At L3-4 there is a broad-based disc bulge with a large broad
central disc protrusion and a large right paracentral disc extrusion
with caudal migration of disc material resulting in mass effect upon
the right intraspinal L4 nerve roots. Severe multifactorial spinal
stenosis. Moderate bilateral foraminal stenosis.
3. Posterior lumbar interbody fusion at L4-5 with a broad disc
bulge. No foraminal or central canal stenosis.
4. At L5-S1 there is a broad-based disc bulge. Moderate bilateral
facet arthropathy. Moderate bilateral foraminal stenosis.

## 2022-04-03 NOTE — TOC Progression Note (Signed)
Transition of Care [facility]) - Progression Note    Patient Details  Name: Kerri Carter MRN: [id number] Date of Birth: 06-05-1940  Transition of Care [facility]) CM/SW Trainer, [place] Phone Number: 04/03/2022, 11:09 AM  Clinical Narrative:     Patient is off bipap, requested ABG be ordered from MD so that NIV can be set up for patient via [employer].     Expected Discharge Plan:  (TBD) Barriers to Discharge: Continued Medical Work up  Expected Discharge Plan and Services                                               Social Determinants of Health (SDOH) Interventions SDOH Screenings   Food Insecurity: No Food Insecurity (03/28/2022)  Housing: Low Risk  (03/28/2022)  Transportation Needs: No Transportation Needs (03/28/2022)  Utilities: Not At Risk (03/28/2022)  Depression (PHQ2-9): Low Risk  (01/06/2022)  Financial Resource Strain: Low Risk  (06/18/2021)  Physical Activity: Inactive (11/24/2019)  Social Connections: Moderately Integrated (06/18/2021)  Stress: No Stress Concern Present (06/18/2021)  Tobacco Use: Medium Risk (03/28/2022)    Readmission Risk Interventions    08/21/2021    3:19 PM 05/24/2021    2:35 PM  Readmission Risk Prevention Plan  Transportation Screening Complete Complete  PCP or Specialist Appt within 3-5 Days  Complete  HRI or [place]  Complete  Social Work Consult for [place] Planning/Counseling  Complete  Palliative Care Screening  Not Applicable  Medication Review Press photographer) Complete Complete  HRI or [place] Complete   SW Recovery Care/Counseling Consult Complete   Palliative Care Screening Not [place] Not Applicable

## 2022-04-03 NOTE — Progress Notes (Addendum)
Daily Progress Note   Patient Name: Kerri Carter       Date: 04/03/2022 DOB: 06-03-1940  Age: 82 y.o. MRN#: [id number] Attending Physician: Jennye Boroughs, MD Primary Care Physician: Carollee Leitz, MD Admit Date: 03/28/2022  Reason for Consultation/Follow-up: Establishing goals of care  Subjective: Notes and labs reviewed. In to see patient, she is on regular flow O2 cannula and eating lunch. She tells me she is feeling much better.  She states her husband was a Theme park manager, and discusses her faith in [place]. Discussed continuing current care. She states her son is aware of her desires on boundaries/time limits to care. PMT will follow up Tuesday on return to service.   Length of Stay: 6  Current Medications: Scheduled Meds:   amLODipine  5 mg Oral Daily   apixaban  5 mg Oral BID   budesonide (PULMICORT) nebulizer solution  0.5 mg Nebulization BID   busPIRone  5 mg Oral BID   Chlorhexidine Gluconate Cloth  6 each Topical Daily   cholecalciferol  4,000 Units Oral Daily   escitalopram  10 mg Oral Daily   famotidine  20 mg Oral QAC breakfast   ferrous sulfate  325 mg Oral Daily   furosemide  40 mg Intravenous BID   gabapentin  300 mg Oral Daily   And   gabapentin  600 mg Oral QHS   guaiFENesin  600 mg Oral BID   insulin aspart  0-5 Units Subcutaneous QHS   insulin aspart  0-9 Units Subcutaneous TID WC   ipratropium-albuterol  3 mL Nebulization Q4H   lamoTRIgine  100 mg Oral BID   lidocaine  1 patch Transdermal Daily   loratadine  10 mg Oral Daily   methylPREDNISolone (SOLU-MEDROL) injection  40 mg Intravenous Q24H   mirabegron ER  25 mg Oral Daily   montelukast  10 mg Oral Daily   multivitamin  1 tablet Oral Daily   nebivolol  5 mg Oral BID   pantoprazole  40 mg Oral BID AC    pravastatin  20 mg Oral q1800   QUEtiapine  25 mg Oral QHS   sucralfate  1 g Oral BID    Continuous Infusions:  sodium chloride Stopped (03/31/22 0309)    PRN Meds: sodium chloride, acetaminophen **OR** acetaminophen, albuterol, dicyclomine, fluticasone, magnesium hydroxide, methocarbamol, morphine injection,  ondansetron **OR** ondansetron (ZOFRAN) IV, sodium chloride, traZODone  Physical Exam Pulmonary:     Effort: Pulmonary effort is normal.  Neurological:     Mental Status: She is alert.             Vital Signs: BP (!) 152/71   Pulse 73   Temp 97.8 F (36.6 C)   Resp 15   Ht '4\' 11"'$  (1.499 m)   Wt 80.3 kg   SpO2 93%   BMI 35.76 kg/m  SpO2: SpO2: 93 % O2 Device: O2 Device: High Flow Nasal Cannula O2 Flow Rate: O2 Flow Rate (L/min): 6 L/min  Intake/output summary:  Intake/Output Summary (Last 24 hours) at 04/03/2022 1459 Last data filed at 04/03/2022 1000 Gross per 24 hour  Intake 395.44 ml  Output 4500 ml  Net -4104.56 ml   LBM: Last BM Date : 03/31/22 (per chart) Baseline Weight: Weight: 80 kg Most recent weight: Weight: 80.3 kg    Patient Active Problem List   Diagnosis Date Noted   Obesity (BMI 30-39.9) 03/31/2022   NSVT (nonsustained ventricular tachycardia) (HCC) 03/30/2022   Acute urinary retention 03/30/2022   Sepsis due to pneumonia ([place]) 03/28/2022   Demand ischemia 03/28/2022   History of major orthopedic surgery 03/19/2022   Iron deficiency anemia due to chronic blood loss 10/09/2021   Chronic heart failure with preserved ejection fraction ([place]) 09/24/2021   Acute on chronic respiratory failure with hypoxia and hypercapnia (HCC) 09/10/2021   Acute on chronic diastolic CHF (congestive heart failure) (HCC) 08/20/2021   Paroxysmal atrial fibrillation ([place]) 07/29/2021   History of lumbar fusion 05/23/2021   Impaired mobility 12/18/2020   Urinary incontinence 12/18/2020   Lumbar radiculopathy 12/11/2020   Chronic pain of both shoulders 08/29/2019    Hypertriglyceridemia 07/24/2019   Bipolar disorder, in full remission, most recent episode depressed ([place]) 07/13/2019   Essential hypertension 06/16/2019   Chronic pain 06/16/2019   Lymphedema 06/05/2019   Chronic venous insufficiency 05/25/2019   PAD (peripheral artery disease) ([place]) 05/25/2019   Interstitial pulmonary disease ([place]) 07/07/2018   Leukocytosis 10/19/2017   Allergic rhinitis 09/28/2017   Age-related osteoporosis without current pathological fracture 11/05/2016   Peripheral neuropathy 10/28/2016   Osteoporosis 08/27/2016   Drug-induced osteoporosis 08/27/2016   Adnexal mass 08/25/2016   Prediabetes 08/25/2016   Coronary atherosclerosis 08/25/2016   Stage 3b chronic kidney disease ([place])    Hyperlipidemia 07/17/2015   Macular degeneration 07/17/2015   Rheumatoid arthritis ([place]) 12/05/2014   Osteoarthritis of knee 06/07/2013   Valvular heart disease 10/13/2011   COPD with acute exacerbation ([place]) 09/09/2011   OSA (obstructive sleep apnea) 09/09/2011   Gastroesophageal reflux disease 02/24/2011   Single kidney 02/24/2011   Renal artery stenosis (HCC) 02/24/2011    Palliative Care Assessment & Plan     Recommendations/Plan: Continue current care.  PMT will follow up Tuesday on return to service if still admitted.   Code Status:    Code Status Orders  (From admission, onward)           Start     Ordered   03/28/22 0250  Do not attempt resuscitation (DNR)  Continuous       Question Answer Comment  If patient has no pulse and is not breathing Do Not Attempt Resuscitation   If patient has a pulse and/or is breathing: Medical Treatment Goals LIMITED ADDITIONAL INTERVENTIONS: Use medication/IV fluids and cardiac monitoring as indicated; Do not use intubation or mechanical ventilation (DNI), also provide comfort medications.  Transfer to  Progressive/Stepdown as indicated, avoid Intensive Care.   Consent: Discussion documented in EHR or advanced directives  reviewed      03/28/22 0252           Code Status History     Date Active Date Inactive Code Status Order ID Comments User Context   09/08/2021 0912 09/12/2021 2210 DNR [id number]  Collier Bullock, MD ED   08/20/2021 0947 08/22/2021 2237 DNR [id number]  Norval Morton, MD ED   07/28/2021 1805 07/31/2021 2134 DNR [id number]  Neena Rhymes, MD ED   07/22/2021 2029 07/24/2021 2142 DNR [id number]  Cox, [place], DO ED   05/27/2021 1832 06/02/2021 2105 DNR [id number]  Flora Lipps, MD Inpatient   05/23/2021 1306 05/27/2021 1832 Full Code [id number]  Loleta Dicker, [place] Inpatient   01/12/2021 2243 01/13/2021 2354 Full Code [id number]  Para Skeans, MD ED   12/11/2020 1422 12/13/2020 2016 Full Code [id number]  Loleta Dicker, [place] Inpatient   10/29/2020 2232 11/04/2020 1947 Full Code [id number]  Mansy, Arvella Merles, MD ED   12/21/2019 1736 12/26/2019 1707 Full Code [id number]  Corky Mull, MD Inpatient   07/26/2018 1110 07/28/2018 1750 Full Code [id number]  Corky Mull, MD Inpatient   11/04/2017 1231 11/05/2017 2155 Full Code [id number]  Corky Mull, MD Inpatient   10/31/2016 2128 11/04/2016 1655 Full Code [id number]  Fritzi Mandes, MD Inpatient   05/07/2016 0944 05/14/2016 2144 Full Code [id number]  Harrie Foreman Inpatient   09/26/2015 2259 09/27/2015 2044 Full Code [id number]  Lance Coon, MD ED      Advance Directive Documentation    Flowsheet Row Most Recent Value  Type of Advance Directive Living will, Healthcare Power of Attorney  Pre-existing out of facility DNR order (yellow form or pink MOST form) --  "MOST" Form in Place? --       Thank you for allowing the Palliative Medicine Team to assist in the care of this patient.    Asencion Gowda, NP  Please contact Palliative Medicine Team phone at [phone] for questions and concerns.

## 2022-04-03 NOTE — Progress Notes (Signed)
Progress Note    Kerri Carter  [id number] DOB: 1940-04-22  DOA: 03/28/2022 PCP: Carollee Leitz, MD      Brief Narrative:    Medical records reviewed and are as summarized below:  Kerri Carter is a 82 y.o. female  with medical history significant of chronic respiratory failure with hypoxia on 3 L/min O2 at baseline, COPD on chronic prednisone, OSA, interstitial lung disease, chronic diastolic CHF, hypertension, paroxysmal A-fib, valvular heart disease, PAD, chronic venous insufficiency, renal artery stenosis, GERD, RA, peripheral neuropathy, lumbar radiculopathy, CKD stage IIIb, chronic leukocytosis, bipolar disorder, chronic pain with lumbar radiculopathy.  She presented to the ED on the evening of 03/27/2022 for evaluation of worsening shortness of breath and in respiratory distress.     She was found to have acute on chronic hypoxic respiratory failure due to multifocal pneumonia and secondary COPD exacerbation.  Started on IV antibiotics and IV steroids and admitted to the hospital.  2/25: remains on 4 L/min O2 with sats 91-92%. Ongoing dyspnea at rest and with conversation.  2/26: rapid response was called on 2/26 because of increased O2 needs, pt unresponsive with increased WOB.  Placed on BiPAP, transferred to stepdown.  ABG with pCO2 73, pH 7.1.  Pulmonology consulted.       Assessment/Plan:   Principal Problem:   Sepsis due to pneumonia [facility]) Active Problems:   COPD with acute exacerbation ([place])   Acute on chronic respiratory failure with hypoxia and hypercapnia (HCC)   NSVT (nonsustained ventricular tachycardia) (HCC)   Stage 3b chronic kidney disease (HCC)   Interstitial pulmonary disease (HCC)   Paroxysmal atrial fibrillation (HCC)   Chronic heart failure with preserved ejection fraction (HCC)   Demand ischemia   Acute on chronic diastolic CHF (congestive heart failure) (HCC)   Essential hypertension   Rheumatoid arthritis (HCC)   History of lumbar  fusion   Hyperlipidemia   Gastroesophageal reflux disease   OSA (obstructive sleep apnea)   Valvular heart disease   Peripheral neuropathy   Chronic venous insufficiency   PAD (peripheral artery disease) (HCC)   Bipolar disorder, in full remission, most recent episode depressed (HCC)   Chronic pain   Lumbar radiculopathy   Urinary incontinence   Iron deficiency anemia due to chronic blood loss   Acute urinary retention   Obesity (BMI 30-39.9)    Body mass index is 35.76 kg/m.  (Obesity)   Sepsis secondary to pneumonia: Discontinue IV Unasyn.  Completed IV azithromycin.   Acute on chronic hypoxic respiratory failure, acute hypercapnic respiratory failure: She was taken off BiPAP this morning.  She is on 6 L/min oxygen via nasal cannula.  Wean down oxygen as able.  She uses 3 L/min oxygen at home.  ABG done today showed pH 7.49, pCO2 48, pO2 78, O2 sat 97.4% on 6 L oxygen.  She does not qualify for home trilogy machine.   COPD exacerbation: Continue bronchodilators.  Change IV Solu-Medrol to prednisone   Acute on chronic diastolic CHF, valvular heart disease (moderate to severe MR, moderate TR, mild to moderate AS): Continue IV Lasix.  Monitor BMP, daily weight and urine output.  BNP was 933 on 03/30/2022.   2D echo in June 2023 showed EF estimated at 55 to 123456, grade 1 diastolic dysfunction   Elevated troponin: This is likely due to demand ischemia/myocardial injury from sepsis and respiratory failure   Paroxysmal atrial fibrillation: Continue Eliquis   Acute urinary retention, history of urinary incontinence: Foley catheter  placed on 03/30/2022.  At that time, bladder scan showed greater than 800 mL of urine.   Other comorbidities include interstitial lung disease, CKD stage IIIb with single left kidney, hypertension, iron deficiency anemia, rheumatoid arthritis, lumbar radiculopathy, bipolar disorder, PVD, chronic venous insufficiency, OSA (on CPAP  is old and  dysfunctional)    Diet Order             Diet Heart Room service appropriate? Yes; Fluid consistency: Thin  Diet effective now                            Consultants: Pulmonologist, palliative care  Procedures: None    Medications:    amLODipine  5 mg Oral Daily   apixaban  5 mg Oral BID   budesonide (PULMICORT) nebulizer solution  0.5 mg Nebulization BID   busPIRone  5 mg Oral BID   Chlorhexidine Gluconate Cloth  6 each Topical Daily   cholecalciferol  4,000 Units Oral Daily   escitalopram  10 mg Oral Daily   famotidine  20 mg Oral QAC breakfast   ferrous sulfate  325 mg Oral Daily   furosemide  40 mg Intravenous BID   gabapentin  300 mg Oral Daily   And   gabapentin  600 mg Oral QHS   guaiFENesin  600 mg Oral BID   insulin aspart  0-5 Units Subcutaneous QHS   insulin aspart  0-9 Units Subcutaneous TID WC   ipratropium-albuterol  3 mL Nebulization Q4H   lamoTRIgine  100 mg Oral BID   lidocaine  1 patch Transdermal Daily   loratadine  10 mg Oral Daily   methylPREDNISolone (SOLU-MEDROL) injection  40 mg Intravenous Q24H   mirabegron ER  25 mg Oral Daily   montelukast  10 mg Oral Daily   multivitamin  1 tablet Oral Daily   nebivolol  5 mg Oral BID   pantoprazole  40 mg Oral BID AC   pravastatin  20 mg Oral q1800   QUEtiapine  25 mg Oral QHS   sucralfate  1 g Oral BID   Continuous Infusions:  sodium chloride Stopped (03/31/22 0309)   ampicillin-sulbactam (UNASYN) IV 3 g (04/03/22 0814)     Anti-infectives (From admission, onward)    Start     Dose/Rate Route Frequency Ordered Stop   03/29/22 0600  azithromycin (ZITHROMAX) 500 mg in sodium chloride 0.9 % 250 mL IVPB        500 mg 250 mL/hr over 60 Minutes Intravenous Every 24 hours 03/28/22 0252 04/01/22 0609   03/28/22 1200  Ampicillin-Sulbactam (UNASYN) 3 g in sodium chloride 0.9 % 100 mL IVPB  Status:  Discontinued        3 g 200 mL/hr over 30 Minutes Intravenous Every 12 hours 03/28/22  0252 03/28/22 0756   03/28/22 0900  Ampicillin-Sulbactam (UNASYN) 3 g in sodium chloride 0.9 % 100 mL IVPB        3 g 200 mL/hr over 30 Minutes Intravenous Every 6 hours 03/28/22 0756     03/28/22 0145  Ampicillin-Sulbactam (UNASYN) 3 g in sodium chloride 0.9 % 100 mL IVPB        3 g 200 mL/hr over 30 Minutes Intravenous  Once 03/28/22 0141 03/28/22 0210   03/28/22 0145  azithromycin (ZITHROMAX) 500 mg in sodium chloride 0.9 % 250 mL IVPB        500 mg 250 mL/hr over 60 Minutes Intravenous  Once 03/28/22 0141  03/28/22 0419              Family Communication/Anticipated D/C date and plan/Code Status   DVT prophylaxis:  apixaban (ELIQUIS) tablet 5 mg     Code Status: DNR  Family Communication: None Disposition Plan: Plan to discharge home when medically stable   Status is: Inpatient Remains inpatient appropriate because: Acute respiratory failure       Subjective:   Interval events noted.  She feels better today.  No shortness of breath or chest pain.  She is tolerating 6 L/min oxygen.  Objective:    Vitals:   04/03/22 0600 04/03/22 0800 04/03/22 1000 04/03/22 1119  BP: 133/64 124/73 (!) 128/56   Pulse: 65 65 66   Resp: '13 14 14   '$ Temp:  97.6 F (36.4 C)    TempSrc:      SpO2: 95% 95% 96% 96%  Weight:      Height:       No data found.   Intake/Output Summary (Last 24 hours) at 04/03/2022 1124 Last data filed at 04/03/2022 1000 Gross per 24 hour  Intake 395.44 ml  Output 4500 ml  Net -4104.56 ml   Filed Weights   04/01/22 0500 04/02/22 0500 04/03/22 0500  Weight: 84.8 kg 80.7 kg 80.3 kg    Exam:  GEN: NAD SKIN: Warm and dry EYES: No pallor or icterus ENT: MMM CV: RRR PULM: Bilateral wheezing, bibasilar rales ABD: soft, ND, NT, +BS CNS: AAO x 3, non focal EXT: No edema or tenderness GU: Foley catheter draining amber urine      Data Reviewed:   I have personally reviewed following labs and imaging studies:  Labs: Labs show the  following:   Basic Metabolic Panel: Recent Labs  Lab 03/29/22 0445 03/30/22 0428 03/31/22 0317 04/01/22 0504 04/02/22 0428 04/03/22 0527  NA 137 140 141 141 141 140  K 4.0 3.8 3.9 4.2 4.1 3.7  CL 103 104 103 102 101 100  CO2 '26 28 27 29 30 30  '$ GLUCOSE 183* 150* 193* 191* 197* 138*  BUN 29* 34* 34* 37* 39* 30*  CREATININE 1.29* 1.28* 1.37* 1.46* 1.21* 1.20*  CALCIUM 8.2* 7.9* 7.3* 7.3* 7.3* 7.3*  MG 2.0  --  2.3  --  2.7* 2.3   GFR Estimated Creatinine Clearance: 34.8 mL/min (A) (by C-G formula based on SCr of 1.2 mg/dL (H)). Liver Function Tests: Recent Labs  Lab 03/28/22 0104  AST 27  ALT 16  ALKPHOS 122  BILITOT 0.6  PROT 7.4  ALBUMIN 3.1*   No results for input(s): "LIPASE", "AMYLASE" in the last 168 hours. No results for input(s): "AMMONIA" in the last 168 hours. Coagulation profile Recent Labs  Lab 03/28/22 0545  INR 1.3*    CBC: Recent Labs  Lab 03/28/22 0104 03/28/22 0545 03/29/22 0445 03/30/22 0428 03/31/22 0317 04/01/22 0504 04/03/22 0527  WBC 17.4*   < > 19.9* 20.8* 15.7* 16.5* 14.4*  NEUTROABS 12.6*  --   --   --   --   --  10.1*  HGB 10.5*   < > 9.1* 9.1* 9.1* 9.6* 10.1*  HCT 34.2*   < > 29.1* 28.9* 29.6* 30.6* 32.4*  MCV 90.2   < > 88.2 89.5 90.0 89.5 89.8  PLT 397   < > 345 383 367 393 339   < > = values in this interval not displayed.   Cardiac Enzymes: No results for input(s): "CKTOTAL", "CKMB", "CKMBINDEX", "TROPONINI" in the last 168 hours. BNP (  last 3 results) No results for input(s): "PROBNP" in the last 8760 hours. CBG: Recent Labs  Lab 04/02/22 0726 04/02/22 1129 04/02/22 1650 04/02/22 2119 04/03/22 0716  GLUCAP 218* 279* 224* 173* 141*   D-Dimer: No results for input(s): "DDIMER" in the last 72 hours. Hgb A1c: No results for input(s): "HGBA1C" in the last 72 hours.  Lipid Profile: No results for input(s): "CHOL", "HDL", "LDLCALC", "TRIG", "CHOLHDL", "LDLDIRECT" in the last 72 hours. Thyroid function studies: No  results for input(s): "TSH", "T4TOTAL", "T3FREE", "THYROIDAB" in the last 72 hours.  Invalid input(s): "FREET3" Anemia work up: No results for input(s): "VITAMINB12", "FOLATE", "FERRITIN", "TIBC", "IRON", "RETICCTPCT" in the last 72 hours. Sepsis Labs: Recent Labs  Lab 03/28/22 0104 03/28/22 0105 03/28/22 0545 03/29/22 0445 03/30/22 0428 03/30/22 0908 03/31/22 0317 04/01/22 0504 04/03/22 0527  PROCALCITON <0.10  --  0.76  --   --  0.80  --   --   --   WBC 17.4*  --  17.2*   < > 20.8*  --  15.7* 16.5* 14.4*  LATICACIDVEN  --  2.2* 1.4  --   --   --   --   --   --    < > = values in this interval not displayed.    Microbiology Recent Results (from the past 240 hour(s))  Resp panel by RT-PCR (RSV, Flu A&B, Covid) Anterior Nasal Swab     Status: None   Collection Time: 03/28/22  1:03 AM   Specimen: Anterior Nasal Swab  Result Value Ref Range Status   SARS Coronavirus 2 by RT PCR NEGATIVE NEGATIVE Final    Comment: (NOTE) SARS-CoV-2 target nucleic acids are NOT DETECTED.  The SARS-CoV-2 RNA is generally detectable in upper respiratory specimens during the acute phase of infection. The lowest concentration of SARS-CoV-2 viral copies this assay can detect is 138 copies/mL. A negative result does not preclude SARS-Cov-2 infection and should not be used as the sole basis for treatment or other patient management decisions. A negative result may occur with  improper specimen collection/handling, submission of specimen other than nasopharyngeal swab, presence of viral mutation(s) within the areas targeted by this assay, and inadequate number of viral copies(<138 copies/mL). A negative result must be combined with clinical observations, patient history, and epidemiological information. The expected result is Negative.  Fact Sheet for Patients:  EntrepreneurPulse.com.au  Fact Sheet for Healthcare Providers:  IncredibleEmployment.be  This test  is no t yet approved or cleared by the [place] FDA and  has been authorized for detection and/or diagnosis of SARS-CoV-2 by FDA under an Emergency Use Authorization (EUA). This EUA will remain  in effect (meaning this test can be used) for the duration of the COVID-19 declaration under Section 564(b)(1) of the Act, 21 U.S.C.section 360bbb-3(b)(1), unless the authorization is terminated  or revoked sooner.       Influenza A by PCR NEGATIVE NEGATIVE Final   Influenza B by PCR NEGATIVE NEGATIVE Final    Comment: (NOTE) The Xpert Xpress SARS-CoV-2/FLU/RSV plus assay is intended as an aid in the diagnosis of influenza from Nasopharyngeal swab specimens and should not be used as a sole basis for treatment. Nasal washings and aspirates are unacceptable for Xpert Xpress SARS-CoV-2/FLU/RSV testing.  Fact Sheet for Patients: EntrepreneurPulse.com.au  Fact Sheet for Healthcare Providers: IncredibleEmployment.be  This test is not yet approved or cleared by the [place] FDA and has been authorized for detection and/or diagnosis of SARS-CoV-2 by FDA under an Emergency Use Authorization (  EUA). This EUA will remain in effect (meaning this test can be used) for the duration of the COVID-19 declaration under Section 564(b)(1) of the Act, 21 U.S.C. section 360bbb-3(b)(1), unless the authorization is terminated or revoked.     Resp Syncytial Virus by PCR NEGATIVE NEGATIVE Final    Comment: (NOTE) Fact Sheet for Patients: EntrepreneurPulse.com.au  Fact Sheet for Healthcare Providers: IncredibleEmployment.be  This test is not yet approved or cleared by the [place] FDA and has been authorized for detection and/or diagnosis of SARS-CoV-2 by FDA under an Emergency Use Authorization (EUA). This EUA will remain in effect (meaning this test can be used) for the duration of the COVID-19 declaration under Section  564(b)(1) of the Act, 21 U.S.C. section 360bbb-3(b)(1), unless the authorization is terminated or revoked.  Performed at [facility], [address] [place], [place] [address]   Culture, blood (routine x 2)     Status: None   Collection Time: 03/28/22  1:03 AM   Specimen: BLOOD  Result Value Ref Range Status   Specimen Description BLOOD BLOOD RIGHT FOREARM  Final   Special Requests   Final    BOTTLES DRAWN AEROBIC AND ANAEROBIC Blood Culture adequate volume   Culture   Final    NO GROWTH 5 DAYS Performed at [facility], [address] [place], [place] [address]    Report Status 04/02/2022 FINAL  Final  Culture, blood (routine x 2)     Status: None   Collection Time: 03/28/22  1:05 AM   Specimen: BLOOD  Result Value Ref Range Status   Specimen Description BLOOD RIGHT ANTECUBITAL  Final   Special Requests   Final    BOTTLES DRAWN AEROBIC AND ANAEROBIC Blood Culture adequate volume   Culture   Final    NO GROWTH 5 DAYS Performed at [facility], [address], [place], [place] [address]    Report Status 04/02/2022 FINAL  Final  MRSA Next Gen by PCR, Nasal     Status: None   Collection Time: 03/30/22  9:50 AM   Specimen: Nasal Mucosa; Nasal Swab  Result Value Ref Range Status   MRSA by PCR Next Gen NOT DETECTED NOT DETECTED Final    Comment: Performed at [facility], [address] [place],  [address]    Procedures and diagnostic studies:  No results found.             LOS: 6 days   Zell Hylton  [facility]   Pager on [email or web address]. If 7PM-7AM, please contact night-coverage at [email or web address]     04/03/2022, 11:24 AM

## 2022-04-04 DIAGNOSIS — A419 Sepsis, unspecified organism: Secondary | ICD-10-CM | POA: Diagnosis not present

## 2022-04-04 DIAGNOSIS — J441 Chronic obstructive pulmonary disease with (acute) exacerbation: Secondary | ICD-10-CM | POA: Diagnosis not present

## 2022-04-04 DIAGNOSIS — J9621 Acute and chronic respiratory failure with hypoxia: Secondary | ICD-10-CM | POA: Diagnosis not present

## 2022-04-04 DIAGNOSIS — J189 Pneumonia, unspecified organism: Secondary | ICD-10-CM | POA: Diagnosis not present

## 2022-04-04 LAB — BASIC METABOLIC PANEL WITH GFR
Anion gap: 10 (ref 5–15)
BUN: 31 mg/dL — ABNORMAL HIGH (ref 8–23)
CO2: 30 mmol/L (ref 22–32)
Calcium: 8 mg/dL — ABNORMAL LOW (ref 8.9–10.3)
Chloride: 96 mmol/L — ABNORMAL LOW (ref 98–111)
Creatinine, Ser: 1.2 mg/dL — ABNORMAL HIGH (ref 0.44–1.00)
GFR, Estimated: 46 mL/min — ABNORMAL LOW
Glucose, Bld: 170 mg/dL — ABNORMAL HIGH (ref 70–99)
Potassium: 3.9 mmol/L (ref 3.5–5.1)
Sodium: 136 mmol/L (ref 135–145)

## 2022-04-04 LAB — GLUCOSE, CAPILLARY
Glucose-Capillary: 364 mg/dL — ABNORMAL HIGH (ref 70–99)
Glucose-Capillary: 263 mg/dL — ABNORMAL HIGH (ref 70–99)

## 2022-04-04 LAB — MAGNESIUM: Magnesium: 2.5 mg/dL — ABNORMAL HIGH (ref 1.7–2.4)

## 2022-04-04 MED ORDER — TRAMADOL HCL 50 MG PO TABS
50.0000 mg | ORAL_TABLET | Freq: Two times a day (BID) | ORAL | Status: DC | PRN
  Administered 2022-04-04 – 2022-04-07 (×6): 50 mg via ORAL
  Filled 2022-04-04 (×6): qty 1

## 2022-04-04 MED ORDER — MORPHINE SULFATE (PF) 2 MG/ML IV SOLN: 1.0000 mg | INTRAVENOUS | Status: DC | PRN

## 2022-04-04 MED ORDER — IPRATROPIUM-ALBUTEROL 0.5-2.5 (3) MG/3ML IN SOLN
3.0000 mL | Freq: Four times a day (QID) | RESPIRATORY_TRACT | Status: DC
  Administered 2022-04-05 (×2): 3 mL via RESPIRATORY_TRACT
  Filled 2022-04-04 (×2): qty 3

## 2022-04-04 MED ORDER — PREDNISONE 20 MG PO TABS: 30.0000 mg | ORAL_TABLET | Freq: Every day | ORAL | Status: DC

## 2022-04-04 NOTE — Progress Notes (Signed)
Progress Note    Kerri Carter  [id number] DOB: 01/18/1941  DOA: 03/28/2022 PCP: Carollee Leitz, MD      Brief Narrative:    Medical records reviewed and are as summarized below:  Kerri Carter is a 82 y.o. female  with medical history significant of chronic respiratory failure with hypoxia on 3 L/min O2 at baseline, COPD on chronic prednisone, OSA, interstitial lung disease, chronic diastolic CHF, hypertension, paroxysmal A-fib, valvular heart disease, PAD, chronic venous insufficiency, renal artery stenosis, GERD, RA, peripheral neuropathy, lumbar radiculopathy, CKD stage IIIb, chronic leukocytosis, bipolar disorder, chronic pain with lumbar radiculopathy.  She presented to the ED on the evening of 03/27/2022 for evaluation of worsening shortness of breath and in respiratory distress.     She was found to have acute on chronic hypoxic respiratory failure due to multifocal pneumonia and secondary COPD exacerbation.  Started on IV antibiotics and IV steroids and admitted to the hospital.  2/25: remains on 4 L/min O2 with sats 91-92%. Ongoing dyspnea at rest and with conversation.  2/26: rapid response was called on 2/26 because of increased O2 needs, pt unresponsive with increased WOB.  Placed on BiPAP, transferred to stepdown.  ABG with pCO2 73, pH 7.1.  Pulmonology consulted.       Assessment/Plan:   Principal Problem:   Sepsis due to pneumonia [facility]) Active Problems:   COPD with acute exacerbation ([place])   Acute on chronic respiratory failure with hypoxia and hypercapnia (HCC)   NSVT (nonsustained ventricular tachycardia) (HCC)   Stage 3b chronic kidney disease (HCC)   Interstitial pulmonary disease (HCC)   Paroxysmal atrial fibrillation (HCC)   Chronic heart failure with preserved ejection fraction (HCC)   Demand ischemia   Acute on chronic diastolic CHF (congestive heart failure) (HCC)   Essential hypertension   Rheumatoid arthritis (HCC)   History of lumbar  fusion   Hyperlipidemia   Gastroesophageal reflux disease   OSA (obstructive sleep apnea)   Valvular heart disease   Peripheral neuropathy   Chronic venous insufficiency   PAD (peripheral artery disease) (HCC)   Bipolar disorder, in full remission, most recent episode depressed (HCC)   Chronic pain   Lumbar radiculopathy   Urinary incontinence   Iron deficiency anemia due to chronic blood loss   Acute urinary retention   Obesity (BMI 30-39.9)    Body mass index is 35.13 kg/m.  (Obesity)   Sepsis secondary to pneumonia: Completed 7 days of IV Unasyn on 04/03/2022.  Completed IV azithromycin.   Acute on chronic hypoxic respiratory failure, acute hypercapnic respiratory failure: Plan to transition from BiPAP to oxygen via nasal cannula during the day.  Wean down oxygen to 3 L/min oxygen which is her baseline.    ABG done today showed pH 7.49, pCO2 48, pO2 78, O2 sat 97.4% on 6 L oxygen.  She does not qualify for home trilogy machine.   COPD exacerbation: Change IV Solu-Medrol to prednisone.  Continue bronchodilators as needed.   Acute on chronic diastolic CHF, valvular heart disease (moderate to severe MR, moderate TR, mild to moderate AS): Continue IV Lasix with plans to switch to torsemide tomorrow.  Monitor BMP, daily weight and urine output.  BNP was 933 on 03/30/2022.   2D echo in June 2023 showed EF estimated at 55 to 123456, grade 1 diastolic dysfunction   Elevated troponin: This is likely due to demand ischemia/myocardial injury from sepsis and respiratory failure   Paroxysmal atrial fibrillation: Continue Eliquis  Acute urinary retention, history of urinary incontinence: Foley catheter placed on 03/30/2022.  At that time, bladder scan showed greater than 800 mL of urine.   Other comorbidities include interstitial lung disease, CKD stage IIIb with single left kidney, hypertension, iron deficiency anemia, rheumatoid arthritis, lumbar radiculopathy, bipolar disorder, PVD,  chronic venous insufficiency, OSA (on CPAP  is old and dysfunctional)   General weakness: PT and OT evaluation Diet Order             Diet Heart Room service appropriate? Yes; Fluid consistency: Thin  Diet effective now                            Consultants: Pulmonologist, palliative care  Procedures: None    Medications:    amLODipine  5 mg Oral Daily   apixaban  5 mg Oral BID   budesonide (PULMICORT) nebulizer solution  0.5 mg Nebulization BID   busPIRone  5 mg Oral BID   Chlorhexidine Gluconate Cloth  6 each Topical Daily   cholecalciferol  4,000 Units Oral Daily   escitalopram  10 mg Oral Daily   famotidine  20 mg Oral QAC breakfast   ferrous sulfate  325 mg Oral Daily   furosemide  40 mg Intravenous BID   gabapentin  300 mg Oral Daily   And   gabapentin  600 mg Oral QHS   guaiFENesin  600 mg Oral BID   insulin aspart  0-5 Units Subcutaneous QHS   insulin aspart  0-9 Units Subcutaneous TID WC   ipratropium-albuterol  3 mL Nebulization Q4H   lamoTRIgine  100 mg Oral BID   lidocaine  1 patch Transdermal Daily   loratadine  10 mg Oral Daily   methylPREDNISolone (SOLU-MEDROL) injection  40 mg Intravenous Q24H   mirabegron ER  25 mg Oral Daily   montelukast  10 mg Oral Daily   multivitamin  1 tablet Oral Daily   nebivolol  5 mg Oral BID   pantoprazole  40 mg Oral BID AC   pravastatin  20 mg Oral q1800   QUEtiapine  25 mg Oral QHS   sucralfate  1 g Oral BID   Continuous Infusions:  sodium chloride Stopped (03/31/22 0309)     Anti-infectives (From admission, onward)    Start     Dose/Rate Route Frequency Ordered Stop   03/29/22 0600  azithromycin (ZITHROMAX) 500 mg in sodium chloride 0.9 % 250 mL IVPB        500 mg 250 mL/hr over 60 Minutes Intravenous Every 24 hours 03/28/22 0252 04/01/22 0609   03/28/22 1200  Ampicillin-Sulbactam (UNASYN) 3 g in sodium chloride 0.9 % 100 mL IVPB  Status:  Discontinued        3 g 200 mL/hr over 30 Minutes  Intravenous Every 12 hours 03/28/22 0252 03/28/22 0756   03/28/22 0900  Ampicillin-Sulbactam (UNASYN) 3 g in sodium chloride 0.9 % 100 mL IVPB  Status:  Discontinued        3 g 200 mL/hr over 30 Minutes Intravenous Every 6 hours 03/28/22 0756 04/03/22 1252   03/28/22 0145  Ampicillin-Sulbactam (UNASYN) 3 g in sodium chloride 0.9 % 100 mL IVPB        3 g 200 mL/hr over 30 Minutes Intravenous  Once 03/28/22 0141 03/28/22 0210   03/28/22 0145  azithromycin (ZITHROMAX) 500 mg in sodium chloride 0.9 % 250 mL IVPB        500 mg 250  mL/hr over 60 Minutes Intravenous  Once 03/28/22 0141 03/28/22 0419              Family Communication/Anticipated D/C date and plan/Code Status   DVT prophylaxis:  apixaban (ELIQUIS) tablet 5 mg     Code Status: DNR  Family Communication: None Disposition Plan: Plan to discharge home when medically stable   Status is: Inpatient Remains inpatient appropriate because: Acute respiratory failure       Subjective:   Interval events noted.  She slept with BiPAP.  She feels better.  No shortness of breath or chest pain.  Objective:    Vitals:   04/04/22 0000 04/04/22 0200 04/04/22 0500 04/04/22 0730  BP: (!) 147/62 (!) 147/62    Pulse: 63 67    Resp: 13 15    Temp:      TempSrc:      SpO2: 97% 97%  96%  Weight:   78.9 kg   Height:       No data found.   Intake/Output Summary (Last 24 hours) at 04/04/2022 1103 Last data filed at 04/03/2022 2200 Gross per 24 hour  Intake --  Output 1700 ml  Net -1700 ml   Filed Weights   04/02/22 0500 04/03/22 0500 04/04/22 0500  Weight: 80.7 kg 80.3 kg 78.9 kg    Exam:   GEN: NAD, on BiPAP SKIN: Warm and dry EYES: No pallor or icterus ENT: MMM CV: RRR PULM: Bilateral wheezing and bibasilar rales but overall lungs sound better ABD: soft, ND, NT, +BS CNS: AAO x 3, non focal EXT: No edema or tenderness GU: Foley catheter draining amber urine     Data Reviewed:   I have personally  reviewed following labs and imaging studies:  Labs: Labs show the following:   Basic Metabolic Panel: Recent Labs  Lab 03/29/22 0445 03/30/22 0428 03/31/22 0317 04/01/22 0504 04/02/22 0428 04/03/22 0527 04/04/22 0405  NA 137   < > 141 141 141 140 136  K 4.0   < > 3.9 4.2 4.1 3.7 3.9  CL 103   < > 103 102 101 100 96*  CO2 26   < > '27 29 30 30 30  '$ GLUCOSE 183*   < > 193* 191* 197* 138* 170*  BUN 29*   < > 34* 37* 39* 30* 31*  CREATININE 1.29*   < > 1.37* 1.46* 1.21* 1.20* 1.20*  CALCIUM 8.2*   < > 7.3* 7.3* 7.3* 7.3* 8.0*  MG 2.0  --  2.3  --  2.7* 2.3 2.5*   < > = values in this interval not displayed.   GFR Estimated Creatinine Clearance: 34.5 mL/min (A) (by C-G formula based on SCr of 1.2 mg/dL (H)). Liver Function Tests: No results for input(s): "AST", "ALT", "ALKPHOS", "BILITOT", "PROT", "ALBUMIN" in the last 168 hours.  No results for input(s): "LIPASE", "AMYLASE" in the last 168 hours. No results for input(s): "AMMONIA" in the last 168 hours. Coagulation profile No results for input(s): "INR", "PROTIME" in the last 168 hours.   CBC: Recent Labs  Lab 03/29/22 0445 03/30/22 0428 03/31/22 0317 04/01/22 0504 04/03/22 0527  WBC 19.9* 20.8* 15.7* 16.5* 14.4*  NEUTROABS  --   --   --   --  10.1*  HGB 9.1* 9.1* 9.1* 9.6* 10.1*  HCT 29.1* 28.9* 29.6* 30.6* 32.4*  MCV 88.2 89.5 90.0 89.5 89.8  PLT 345 383 367 393 339   Cardiac Enzymes: No results for input(s): "CKTOTAL", "CKMB", "CKMBINDEX", "TROPONINI" in  the last 168 hours. BNP (last 3 results) No results for input(s): "PROBNP" in the last 8760 hours. CBG: Recent Labs  Lab 04/02/22 1129 04/02/22 1650 04/02/22 2119 04/03/22 0716 04/03/22 1125  GLUCAP 279* 224* 173* 141* 225*   D-Dimer: No results for input(s): "DDIMER" in the last 72 hours. Hgb A1c: No results for input(s): "HGBA1C" in the last 72 hours.  Lipid Profile: No results for input(s): "CHOL", "HDL", "LDLCALC", "TRIG", "CHOLHDL",  "LDLDIRECT" in the last 72 hours. Thyroid function studies: No results for input(s): "TSH", "T4TOTAL", "T3FREE", "THYROIDAB" in the last 72 hours.  Invalid input(s): "FREET3" Anemia work up: No results for input(s): "VITAMINB12", "FOLATE", "FERRITIN", "TIBC", "IRON", "RETICCTPCT" in the last 72 hours. Sepsis Labs: Recent Labs  Lab 03/30/22 0428 03/30/22 0908 03/31/22 0317 04/01/22 0504 04/03/22 0527  PROCALCITON  --  0.80  --   --   --   WBC 20.8*  --  15.7* 16.5* 14.4*    Microbiology Recent Results (from the past 240 hour(s))  Resp panel by RT-PCR (RSV, Flu A&B, Covid) Anterior Nasal Swab     Status: None   Collection Time: 03/28/22  1:03 AM   Specimen: Anterior Nasal Swab  Result Value Ref Range Status   SARS Coronavirus 2 by RT PCR NEGATIVE NEGATIVE Final    Comment: (NOTE) SARS-CoV-2 target nucleic acids are NOT DETECTED.  The SARS-CoV-2 RNA is generally detectable in upper respiratory specimens during the acute phase of infection. The lowest concentration of SARS-CoV-2 viral copies this assay can detect is 138 copies/mL. A negative result does not preclude SARS-Cov-2 infection and should not be used as the sole basis for treatment or other patient management decisions. A negative result may occur with  improper specimen collection/handling, submission of specimen other than nasopharyngeal swab, presence of viral mutation(s) within the areas targeted by this assay, and inadequate number of viral copies(<138 copies/mL). A negative result must be combined with clinical observations, patient history, and epidemiological information. The expected result is Negative.  Fact Sheet for Patients:  EntrepreneurPulse.com.au  Fact Sheet for Healthcare Providers:  IncredibleEmployment.be  This test is no t yet approved or cleared by the [place] FDA and  has been authorized for detection and/or diagnosis of SARS-CoV-2 by FDA under an  Emergency Use Authorization (EUA). This EUA will remain  in effect (meaning this test can be used) for the duration of the COVID-19 declaration under Section 564(b)(1) of the Act, 21 U.S.C.section 360bbb-3(b)(1), unless the authorization is terminated  or revoked sooner.       Influenza A by PCR NEGATIVE NEGATIVE Final   Influenza B by PCR NEGATIVE NEGATIVE Final    Comment: (NOTE) The Xpert Xpress SARS-CoV-2/FLU/RSV plus assay is intended as an aid in the diagnosis of influenza from Nasopharyngeal swab specimens and should not be used as a sole basis for treatment. Nasal washings and aspirates are unacceptable for Xpert Xpress SARS-CoV-2/FLU/RSV testing.  Fact Sheet for Patients: EntrepreneurPulse.com.au  Fact Sheet for Healthcare Providers: IncredibleEmployment.be  This test is not yet approved or cleared by the [place] FDA and has been authorized for detection and/or diagnosis of SARS-CoV-2 by FDA under an Emergency Use Authorization (EUA). This EUA will remain in effect (meaning this test can be used) for the duration of the COVID-19 declaration under Section 564(b)(1) of the Act, 21 U.S.C. section 360bbb-3(b)(1), unless the authorization is terminated or revoked.     Resp Syncytial Virus by PCR NEGATIVE NEGATIVE Final    Comment: (NOTE) Fact Sheet  for Patients: EntrepreneurPulse.com.au  Fact Sheet for Healthcare Providers: IncredibleEmployment.be  This test is not yet approved or cleared by the [place] FDA and has been authorized for detection and/or diagnosis of SARS-CoV-2 by FDA under an Emergency Use Authorization (EUA). This EUA will remain in effect (meaning this test can be used) for the duration of the COVID-19 declaration under Section 564(b)(1) of the Act, 21 U.S.C. section 360bbb-3(b)(1), unless the authorization is terminated or revoked.  Performed at [facility], [address] [place], [place] [address]   Culture, blood (routine x 2)     Status: None   Collection Time: 03/28/22  1:03 AM   Specimen: BLOOD  Result Value Ref Range Status   Specimen Description BLOOD BLOOD RIGHT FOREARM  Final   Special Requests   Final    BOTTLES DRAWN AEROBIC AND ANAEROBIC Blood Culture adequate volume   Culture   Final    NO GROWTH 5 DAYS Performed at [facility], [address], [place], [place] [address]    Report Status 04/02/2022 FINAL  Final  Culture, blood (routine x 2)     Status: None   Collection Time: 03/28/22  1:05 AM   Specimen: BLOOD  Result Value Ref Range Status   Specimen Description BLOOD RIGHT ANTECUBITAL  Final   Special Requests   Final    BOTTLES DRAWN AEROBIC AND ANAEROBIC Blood Culture adequate volume   Culture   Final    NO GROWTH 5 DAYS Performed at [facility], [address] [place], [place] [address]    Report Status 04/02/2022 FINAL  Final  MRSA Next Gen by PCR, Nasal     Status: None   Collection Time: 03/30/22  9:50 AM   Specimen: Nasal Mucosa; Nasal Swab  Result Value Ref Range Status   MRSA by PCR Next Gen NOT DETECTED NOT DETECTED Final    Comment: Performed at [facility], [address] [place],  [address]    Procedures and diagnostic studies:  No results found.             LOS: 7 days   Stoney Karczewski  [facility]   Pager on [email or web address]. If 7PM-7AM, please contact night-coverage at [email or web address]     04/04/2022, 11:03 AM

## 2022-04-05 DIAGNOSIS — A419 Sepsis, unspecified organism: Secondary | ICD-10-CM | POA: Diagnosis not present

## 2022-04-05 DIAGNOSIS — J189 Pneumonia, unspecified organism: Secondary | ICD-10-CM | POA: Diagnosis not present

## 2022-04-05 DIAGNOSIS — I5033 Acute on chronic diastolic (congestive) heart failure: Secondary | ICD-10-CM | POA: Diagnosis not present

## 2022-04-05 DIAGNOSIS — J9621 Acute and chronic respiratory failure with hypoxia: Secondary | ICD-10-CM | POA: Diagnosis not present

## 2022-04-05 LAB — BASIC METABOLIC PANEL
Anion gap: 9 (ref 5–15)
BUN: 35 mg/dL — ABNORMAL HIGH (ref 8–23)
CO2: 32 mmol/L (ref 22–32)
Calcium: 8.6 mg/dL — ABNORMAL LOW (ref 8.9–10.3)
Chloride: 97 mmol/L — ABNORMAL LOW (ref 98–111)
Creatinine, Ser: 1.23 mg/dL — ABNORMAL HIGH (ref 0.44–1.00)
GFR, Estimated: 45 mL/min — ABNORMAL LOW (ref >60)
Glucose, Bld: 146 mg/dL — ABNORMAL HIGH (ref 70–99)
Potassium: 4.9 mmol/L (ref 3.5–5.1)
Sodium: 138 mmol/L (ref 135–145)

## 2022-04-05 LAB — MAGNESIUM: Magnesium: 2.9 mg/dL — ABNORMAL HIGH (ref 1.7–2.4)

## 2022-04-05 LAB — GLUCOSE, CAPILLARY
Glucose-Capillary: 144 mg/dL — ABNORMAL HIGH (ref 70–99)
Glucose-Capillary: 166 mg/dL — ABNORMAL HIGH (ref 70–99)
Glucose-Capillary: 272 mg/dL — ABNORMAL HIGH (ref 70–99)
Glucose-Capillary: 207 mg/dL — ABNORMAL HIGH (ref 70–99)

## 2022-04-05 MED ORDER — IPRATROPIUM-ALBUTEROL 0.5-2.5 (3) MG/3ML IN SOLN
3.0000 mL | Freq: Two times a day (BID) | RESPIRATORY_TRACT | Status: DC
  Administered 2022-04-05 – 2022-04-08 (×6): 3 mL via RESPIRATORY_TRACT
  Filled 2022-04-05 (×6): qty 3

## 2022-04-05 MED ORDER — TORSEMIDE 20 MG PO TABS
20.0000 mg | ORAL_TABLET | Freq: Every day | ORAL | Status: DC
  Administered 2022-04-06 – 2022-04-08 (×3): 20 mg via ORAL
  Filled 2022-04-05 (×3): qty 1

## 2022-04-05 MED ORDER — PREDNISONE 20 MG PO TABS
20.0000 mg | ORAL_TABLET | Freq: Every day | ORAL | Status: DC
  Administered 2022-04-05: 20 mg via ORAL
  Filled 2022-04-05: qty 1

## 2022-04-05 NOTE — Evaluation (Signed)
Physical Therapy Evaluation Patient Details Name: Kerri Carter MRN: [id number] DOB: May 16, 1940 Today's Date: 04/05/2022  History of Present Illness  Pt is a 82 y.o. female  with medical history significant of chronic respiratory failure with hypoxia on 3 L/min O2 at baseline, COPD on chronic prednisone, OSA, interstitial lung disease, chronic diastolic CHF, hypertension, paroxysmal A-fib, valvular heart disease, PAD, chronic venous insufficiency, renal artery stenosis, GERD, RA, peripheral neuropathy, lumbar radiculopathy, CKD stage IIIb, chronic leukocytosis, bipolar disorder, chronic pain with lumbar radiculopathy.  She presented to the ED on the evening of 03/27/2022 for evaluation of worsening shortness of breath and in respiratory distress.  MD assessment includes: Sepsis secondary to pneumonia, COPD exacerbation, acute on chronic diastolic CHF, elevated troponin likely due to demand ischemia/myocardial injury from sepsis and respiratory failure, and acute urinary retention.   Clinical Impression  Pt was pleasant and motivated to participate during the session and put forth good effort throughout. Pt required no physical assistance during the session and presented with good sitting and standing balance.  Pt was able to amb 12 feet with no overt LOB including while taking backwards steps with a RW with SpO2 and HR WNL on 3LO2/min.  Pt reports feeling subjectively weaker than at her baseline with decreased activity tolerance and will benefit from HHPT upon discharge to safely address deficits listed in patient problem list for decreased caregiver assistance and eventual return to PLOF.         Recommendations for follow up therapy are one component of a multi-disciplinary discharge planning process, led by the attending physician.  Recommendations may be updated based on patient status, additional functional criteria and insurance authorization.  Follow Up Recommendations Home health PT       Assistance Recommended at Discharge Intermittent Supervision/Assistance  Patient can return home with the following  A little help with walking and/or transfers;A little help with bathing/dressing/bathroom;Assistance with cooking/housework;Direct supervision/assist for medications management;Help with stairs or ramp for entrance;Assist for transportation    Equipment Recommendations None recommended by PT  Recommendations for Other Services       Functional Status Assessment Patient has had a recent decline in their functional status and demonstrates the ability to make significant improvements in function in a reasonable and predictable amount of time.     Precautions / Restrictions Precautions Precautions: Fall Restrictions Weight Bearing Restrictions: No      Mobility  Bed Mobility Overal bed mobility: Modified Independent             General bed mobility comments: Min extra time, effort, and use of bed rail    Transfers Overall transfer level: Needs assistance Equipment used: Rolling walker (2 wheels) Transfers: Sit to/from Stand Sit to Stand: Min guard           General transfer comment: Good eccentric and concentric control and stability    Ambulation/Gait Ambulation/Gait assistance: Min guard Gait Distance (Feet): 12 Feet Assistive device: Rolling walker (2 wheels) Gait Pattern/deviations: Step-through pattern, Decreased step length - right, Decreased step length - left Gait velocity: decreased     General Gait Details: Slow cadence but steady without LOB including while taking backwards steps  Stairs            Wheelchair Mobility    Modified Rankin (Stroke Patients Only)       Balance Overall balance assessment: Needs assistance   Sitting balance-Leahy Scale: Normal     Standing balance support: Bilateral upper extremity supported, During functional activity Standing balance-Leahy Scale:  Good                                Pertinent Vitals/Pain Pain Assessment Pain Assessment: No/denies pain    Home Living Family/patient expects to be discharged to:: Private residence Living Arrangements: Other relatives Available Help at Discharge: Family;Available PRN/intermittently Type of Home: House Home Access: Ramped entrance       Home Layout: One level Home Equipment: Rollator (4 wheels);BSC/3in1;Shower seat;Toilet riser;Grab bars - toilet;Hand held shower head;Grab bars - tub/shower;Adaptive equipment;Transport chair;Other (comment);Wheelchair - power Additional Comments: Pt has a twin size bed that elevates at [facility] and FOB. Pt lives with grandchildren who work during the day.    Prior Function               Mobility Comments: Mod Ind amb household distances with a rollator or occasional furniture cruising, no falls in the last 6 months ADLs Comments: Ind with ADLs but only showers when family is home for safety     Hand Dominance   Dominant Hand: Right    Extremity/Trunk Assessment   Upper Extremity Assessment Upper Extremity Assessment: Generalized weakness    Lower Extremity Assessment Lower Extremity Assessment: Generalized weakness       Communication   Communication: No difficulties  Cognition Arousal/Alertness: Awake/alert Behavior During Therapy: WFL for tasks assessed/performed Overall Cognitive Status: Within Functional Limits for tasks assessed                                          General Comments      Exercises Total Joint Exercises Ankle Circles/Pumps: AROM, Strengthening, Both, 10 reps Quad Sets: Strengthening, Both, 10 reps Gluteal Sets: Strengthening, Both, 10 reps Hip ABduction/ADduction: Strengthening, Both, 5 reps Long Arc Quad: Strengthening, Both, 10 reps Knee Flexion: Strengthening, Both, 10 reps   Assessment/Plan    PT Assessment Patient needs continued PT services  PT Problem List Decreased strength;Decreased activity  tolerance;Decreased balance;Decreased mobility       PT Treatment Interventions DME instruction;Gait training;Functional mobility training;Therapeutic activities;Therapeutic exercise;Balance training;Patient/family education    PT Goals (Current goals can be found in the Care Plan section)  Acute Rehab PT Goals Patient Stated Goal: To get stronger PT Goal Formulation: With patient Time For Goal Achievement: 04/18/22 Potential to Achieve Goals: Good    Frequency Min 2X/week     Co-evaluation               AM-PAC PT "6 Clicks" Mobility  Outcome Measure Help needed turning from your back to your side while in a flat bed without using bedrails?: A Little Help needed moving from lying on your back to sitting on the side of a flat bed without using bedrails?: A Little Help needed moving to and from a bed to a chair (including a wheelchair)?: A Little Help needed standing up from a chair using your arms (e.g., wheelchair or bedside chair)?: A Little Help needed to walk in hospital room?: A Little Help needed climbing 3-5 steps with a railing? : A Lot 6 Click Score: 17    End of Session Equipment Utilized During Treatment: Gait belt;Oxygen Activity Tolerance: Patient tolerated treatment well Patient left: in chair;with call bell/phone within reach;with chair alarm set Nurse Communication: Mobility status PT Visit Diagnosis: Difficulty in walking, not elsewhere classified (R26.2);Muscle weakness (generalized) (M62.81)  Time: [id number] PT Time Calculation (min) (ACUTE ONLY): 31 min   Charges:   PT Evaluation $PT Eval Moderate Complexity: 1 Mod PT Treatments $Therapeutic Exercise: 8-22 mins      D. Scott Brentney Goldbach PT, DPT 04/05/22, 11:43 AM

## 2022-04-05 NOTE — Progress Notes (Addendum)
Progress Note    Kerri Carter  [id number] DOB: 1940/10/08  DOA: 03/28/2022 PCP: Carollee Leitz, MD      Brief Narrative:    Medical records reviewed and are as summarized below:  Kerri Carter is a 82 y.o. female  with medical history significant of chronic respiratory failure with hypoxia on 3 L/min O2 at baseline, COPD on chronic prednisone, OSA, interstitial lung disease, chronic diastolic CHF, hypertension, paroxysmal A-fib, valvular heart disease, PAD, chronic venous insufficiency, renal artery stenosis, GERD, RA, peripheral neuropathy, lumbar radiculopathy, CKD stage IIIb, chronic leukocytosis, bipolar disorder, chronic pain with lumbar radiculopathy.  She presented to the ED on the evening of 03/27/2022 for evaluation of worsening shortness of breath and in respiratory distress.     She was found to have acute on chronic hypoxic respiratory failure due to multifocal pneumonia and secondary COPD exacerbation.  Started on IV antibiotics and IV steroids and admitted to the hospital.  2/25: remains on 4 L/min O2 with sats 91-92%. Ongoing dyspnea at rest and with conversation.  2/26: rapid response was called on 2/26 because of increased O2 needs, pt unresponsive with increased WOB.  Placed on BiPAP, transferred to stepdown.  ABG with pCO2 73, pH 7.1.  Pulmonology consulted.       Assessment/Plan:   Principal Problem:   Sepsis due to pneumonia [facility]) Active Problems:   COPD with acute exacerbation ([place])   Acute on chronic respiratory failure with hypoxia and hypercapnia (HCC)   NSVT (nonsustained ventricular tachycardia) (HCC)   Stage 3b chronic kidney disease (HCC)   Interstitial pulmonary disease (HCC)   Paroxysmal atrial fibrillation (HCC)   Chronic heart failure with preserved ejection fraction (HCC)   Demand ischemia   Acute on chronic diastolic CHF (congestive heart failure) (HCC)   Essential hypertension   Rheumatoid arthritis (HCC)   History of lumbar  fusion   Hyperlipidemia   Gastroesophageal reflux disease   OSA (obstructive sleep apnea)   Valvular heart disease   Peripheral neuropathy   Chronic venous insufficiency   PAD (peripheral artery disease) (HCC)   Bipolar disorder, in full remission, most recent episode depressed (HCC)   Chronic pain   Lumbar radiculopathy   Urinary incontinence   Iron deficiency anemia due to chronic blood loss   Acute urinary retention   Obesity (BMI 30-39.9)    Body mass index is 35.13 kg/m.  (Obesity)   Sepsis secondary to pneumonia: Completed 7 days of IV Unasyn on 04/03/2022.  Completed IV azithromycin.   Acute on chronic hypoxic respiratory failure, acute hypercapnic respiratory failure: She is tolerating 3 L/min oxygen via [place].  BiPAP nightly has been discontinued and she will not use BiPAP tonight in preparation for discharge tomorrow.   Mild OSA: Patient said her CPAP machine is dysfunctional and she has not used it for over 5 years.  She does not qualify for home trilogy machine.  She has been advised to follow-up with her PCP or pulmonologist for sleep study so that she can be fitted with a new CPAP machine.  ABG showed pH 7.49, pCO2 48, pO2 78, O2 sat 97.4% on 6 L oxygen.     COPD exacerbation: Continue prednisone but decrease dose from 30 to 20 mg daily.  Continue bronchodilators as needed.   Acute on chronic diastolic CHF, valvular heart disease (moderate to severe MR, moderate TR, mild to moderate AS): Discontinue IV Lasix.  Start torsemide tomorrow.  Monitor BMP, daily weight and urine output.  BNP was 933 on 03/30/2022.   2D echo in June 2023 showed EF estimated at 55 to 123456, grade 1 diastolic dysfunction   Elevated troponin: This is likely due to demand ischemia/myocardial injury from sepsis and respiratory failure   Paroxysmal atrial fibrillation: Continue Eliquis   Acute urinary retention, history of urinary incontinence: Urinary retention has resolved.  She no longer has a  Foley catheter.   Other comorbidities include interstitial lung disease, CKD stage IIIb with single left kidney, hypertension, iron deficiency anemia, rheumatoid arthritis, lumbar radiculopathy, bipolar disorder, PVD, chronic venous insufficiency, OSA (on CPAP  is old and dysfunctional)   General weakness: PT recommended home health therapy  Patient has been informed that he may be discharged home tomorrow if she continues to do well.  Diet Order             Diet Heart Room service appropriate? Yes; Fluid consistency: Thin  Diet effective now                            Consultants: Pulmonologist, palliative care  Procedures: None    Medications:    amLODipine  5 mg Oral Daily   apixaban  5 mg Oral BID   budesonide (PULMICORT) nebulizer solution  0.5 mg Nebulization BID   busPIRone  5 mg Oral BID   Chlorhexidine Gluconate Cloth  6 each Topical Daily   cholecalciferol  4,000 Units Oral Daily   escitalopram  10 mg Oral Daily   famotidine  20 mg Oral QAC breakfast   ferrous sulfate  325 mg Oral Daily   gabapentin  300 mg Oral Daily   And   gabapentin  600 mg Oral QHS   guaiFENesin  600 mg Oral BID   insulin aspart  0-5 Units Subcutaneous QHS   insulin aspart  0-9 Units Subcutaneous TID WC   ipratropium-albuterol  3 mL Nebulization BID   lamoTRIgine  100 mg Oral BID   lidocaine  1 patch Transdermal Daily   loratadine  10 mg Oral Daily   mirabegron ER  25 mg Oral Daily   montelukast  10 mg Oral Daily   multivitamin  1 tablet Oral Daily   nebivolol  5 mg Oral BID   pantoprazole  40 mg Oral BID AC   pravastatin  20 mg Oral q1800   predniSONE  20 mg Oral Q breakfast   QUEtiapine  25 mg Oral QHS   sucralfate  1 g Oral BID   [START ON 04/06/2022] torsemide  20 mg Oral Daily   Continuous Infusions:  sodium chloride Stopped (03/31/22 0309)     Anti-infectives (From admission, onward)    Start     Dose/Rate Route Frequency Ordered Stop   03/29/22 0600   azithromycin (ZITHROMAX) 500 mg in sodium chloride 0.9 % 250 mL IVPB        500 mg 250 mL/hr over 60 Minutes Intravenous Every 24 hours 03/28/22 0252 04/01/22 0609   03/28/22 1200  Ampicillin-Sulbactam (UNASYN) 3 g in sodium chloride 0.9 % 100 mL IVPB  Status:  Discontinued        3 g 200 mL/hr over 30 Minutes Intravenous Every 12 hours 03/28/22 0252 03/28/22 0756   03/28/22 0900  Ampicillin-Sulbactam (UNASYN) 3 g in sodium chloride 0.9 % 100 mL IVPB  Status:  Discontinued        3 g 200 mL/hr over 30 Minutes Intravenous Every 6 hours 03/28/22 0756 04/03/22 1252  03/28/22 0145  Ampicillin-Sulbactam (UNASYN) 3 g in sodium chloride 0.9 % 100 mL IVPB        3 g 200 mL/hr over 30 Minutes Intravenous  Once 03/28/22 0141 03/28/22 0210   03/28/22 0145  azithromycin (ZITHROMAX) 500 mg in sodium chloride 0.9 % 250 mL IVPB        500 mg 250 mL/hr over 60 Minutes Intravenous  Once 03/28/22 0141 03/28/22 0419              Family Communication/Anticipated D/C date and plan/Code Status   DVT prophylaxis:  apixaban (ELIQUIS) tablet 5 mg     Code Status: DNR  Family Communication: None Disposition Plan: Plan to discharge home tomorrow   Status is: Inpatient Remains inpatient appropriate because: Acute respiratory failure       Subjective:   Interval events noted.  She feels much better today.  No chest pain or shortness of breath.  Eusebio Me, RN, was at the bedside  Objective:    Vitals:   04/04/22 2000 04/05/22 0023 04/05/22 0806 04/05/22 1222  BP:  (!) 170/59 (!) 111/51 (!) 119/54  Pulse:  64 64 73  Resp:  '20 16 16  '$ Temp:  98.4 F (36.9 C) (!) 97.3 F (36.3 C) 97.9 F (36.6 C)  TempSrc:      SpO2: 95% 100% 97% 100%  Weight:      Height:       No data found.   Intake/Output Summary (Last 24 hours) at 04/05/2022 1402 Last data filed at 04/05/2022 1211 Gross per 24 hour  Intake 480 ml  Output 2150 ml  Net -1670 ml   Filed Weights   04/02/22 0500 04/03/22 0500  04/04/22 0500  Weight: 80.7 kg 80.3 kg 78.9 kg    Exam:   GEN: NAD SKIN: Warm and dry EYES: No pallor or icterus ENT: MMM CV: RRR PULM: No wheezing or rales heard ABD: soft, ND, NT, +BS CNS: AAO x 3, non focal EXT: No edema or tenderness    Data Reviewed:   I have personally reviewed following labs and imaging studies:  Labs: Labs show the following:   Basic Metabolic Panel: Recent Labs  Lab 03/31/22 0317 04/01/22 0504 04/02/22 0428 04/03/22 0527 04/04/22 0405 04/05/22 0553  NA 141 141 141 140 136 138  K 3.9 4.2 4.1 3.7 3.9 4.9  CL 103 102 101 100 96* 97*  CO2 '27 29 30 30 30 '$ 32  GLUCOSE 193* 191* 197* 138* 170* 146*  BUN 34* 37* 39* 30* 31* 35*  CREATININE 1.37* 1.46* 1.21* 1.20* 1.20* 1.23*  CALCIUM 7.3* 7.3* 7.3* 7.3* 8.0* 8.6*  MG 2.3  --  2.7* 2.3 2.5* 2.9*   GFR Estimated Creatinine Clearance: 33.7 mL/min (A) (by C-G formula based on SCr of 1.23 mg/dL (H)). Liver Function Tests: No results for input(s): "AST", "ALT", "ALKPHOS", "BILITOT", "PROT", "ALBUMIN" in the last 168 hours.  No results for input(s): "LIPASE", "AMYLASE" in the last 168 hours. No results for input(s): "AMMONIA" in the last 168 hours. Coagulation profile No results for input(s): "INR", "PROTIME" in the last 168 hours.   CBC: Recent Labs  Lab 03/30/22 0428 03/31/22 0317 04/01/22 0504 04/03/22 0527  WBC 20.8* 15.7* 16.5* 14.4*  NEUTROABS  --   --   --  10.1*  HGB 9.1* 9.1* 9.6* 10.1*  HCT 28.9* 29.6* 30.6* 32.4*  MCV 89.5 90.0 89.5 89.8  PLT 383 367 393 339   Cardiac Enzymes: No results for input(s): "CKTOTAL", "  CKMB", "CKMBINDEX", "TROPONINI" in the last 168 hours. BNP (last 3 results) No results for input(s): "PROBNP" in the last 8760 hours. CBG: Recent Labs  Lab 04/03/22 1125 04/04/22 1648 04/04/22 2016 04/05/22 0812 04/05/22 1148  GLUCAP 225* 364* 263* 144* 166*   D-Dimer: No results for input(s): "DDIMER" in the last 72 hours. Hgb A1c: No results for  input(s): "HGBA1C" in the last 72 hours.  Lipid Profile: No results for input(s): "CHOL", "HDL", "LDLCALC", "TRIG", "CHOLHDL", "LDLDIRECT" in the last 72 hours. Thyroid function studies: No results for input(s): "TSH", "T4TOTAL", "T3FREE", "THYROIDAB" in the last 72 hours.  Invalid input(s): "FREET3" Anemia work up: No results for input(s): "VITAMINB12", "FOLATE", "FERRITIN", "TIBC", "IRON", "RETICCTPCT" in the last 72 hours. Sepsis Labs: Recent Labs  Lab 03/30/22 0428 03/30/22 0908 03/31/22 0317 04/01/22 0504 04/03/22 0527  PROCALCITON  --  0.80  --   --   --   WBC 20.8*  --  15.7* 16.5* 14.4*    Microbiology Recent Results (from the past 240 hour(s))  Resp panel by RT-PCR (RSV, Flu A&B, Covid) Anterior Nasal Swab     Status: None   Collection Time: 03/28/22  1:03 AM   Specimen: Anterior Nasal Swab  Result Value Ref Range Status   SARS Coronavirus 2 by RT PCR NEGATIVE NEGATIVE Final    Comment: (NOTE) SARS-CoV-2 target nucleic acids are NOT DETECTED.  The SARS-CoV-2 RNA is generally detectable in upper respiratory specimens during the acute phase of infection. The lowest concentration of SARS-CoV-2 viral copies this assay can detect is 138 copies/mL. A negative result does not preclude SARS-Cov-2 infection and should not be used as the sole basis for treatment or other patient management decisions. A negative result may occur with  improper specimen collection/handling, submission of specimen other than nasopharyngeal swab, presence of viral mutation(s) within the areas targeted by this assay, and inadequate number of viral copies(<138 copies/mL). A negative result must be combined with clinical observations, patient history, and epidemiological information. The expected result is Negative.  Fact Sheet for Patients:  EntrepreneurPulse.com.au  Fact Sheet for Healthcare Providers:  IncredibleEmployment.be  This test is no t yet  approved or cleared by the [place] FDA and  has been authorized for detection and/or diagnosis of SARS-CoV-2 by FDA under an Emergency Use Authorization (EUA). This EUA will remain  in effect (meaning this test can be used) for the duration of the COVID-19 declaration under Section 564(b)(1) of the Act, 21 U.S.C.section 360bbb-3(b)(1), unless the authorization is terminated  or revoked sooner.       Influenza A by PCR NEGATIVE NEGATIVE Final   Influenza B by PCR NEGATIVE NEGATIVE Final    Comment: (NOTE) The Xpert Xpress SARS-CoV-2/FLU/RSV plus assay is intended as an aid in the diagnosis of influenza from Nasopharyngeal swab specimens and should not be used as a sole basis for treatment. Nasal washings and aspirates are unacceptable for Xpert Xpress SARS-CoV-2/FLU/RSV testing.  Fact Sheet for Patients: EntrepreneurPulse.com.au  Fact Sheet for Healthcare Providers: IncredibleEmployment.be  This test is not yet approved or cleared by the [place] FDA and has been authorized for detection and/or diagnosis of SARS-CoV-2 by FDA under an Emergency Use Authorization (EUA). This EUA will remain in effect (meaning this test can be used) for the duration of the COVID-19 declaration under Section 564(b)(1) of the Act, 21 U.S.C. section 360bbb-3(b)(1), unless the authorization is terminated or revoked.     Resp Syncytial Virus by PCR NEGATIVE NEGATIVE Final  Comment: (NOTE) Fact Sheet for Patients: EntrepreneurPulse.com.au  Fact Sheet for Healthcare Providers: IncredibleEmployment.be  This test is not yet approved or cleared by the [place] FDA and has been authorized for detection and/or diagnosis of SARS-CoV-2 by FDA under an Emergency Use Authorization (EUA). This EUA will remain in effect (meaning this test can be used) for the duration of the COVID-19 declaration under Section 564(b)(1) of  the Act, 21 U.S.C. section 360bbb-3(b)(1), unless the authorization is terminated or revoked.  Performed at [facility], [address] [place], [place] [address]   Culture, blood (routine x 2)     Status: None   Collection Time: 03/28/22  1:03 AM   Specimen: BLOOD  Result Value Ref Range Status   Specimen Description BLOOD BLOOD RIGHT FOREARM  Final   Special Requests   Final    BOTTLES DRAWN AEROBIC AND ANAEROBIC Blood Culture adequate volume   Culture   Final    NO GROWTH 5 DAYS Performed at [facility], [address], [place], [place] [address]    Report Status 04/02/2022 FINAL  Final  Culture, blood (routine x 2)     Status: None   Collection Time: 03/28/22  1:05 AM   Specimen: BLOOD  Result Value Ref Range Status   Specimen Description BLOOD RIGHT ANTECUBITAL  Final   Special Requests   Final    BOTTLES DRAWN AEROBIC AND ANAEROBIC Blood Culture adequate volume   Culture   Final    NO GROWTH 5 DAYS Performed at [facility], [address], [place], [place] [address]    Report Status 04/02/2022 FINAL  Final  MRSA Next Gen by PCR, Nasal     Status: None   Collection Time: 03/30/22  9:50 AM   Specimen: Nasal Mucosa; Nasal Swab  Result Value Ref Range Status   MRSA by PCR Next Gen NOT DETECTED NOT DETECTED Final    Comment: Performed at [facility], [address] [place], [place] [address]    Procedures and diagnostic studies:  No results found.             LOS: 8 days   Maximo Spratling  [facility]   Pager on [email or web address]. If 7PM-7AM, please contact night-coverage at [email or web address]     04/05/2022, 2:02 PM

## 2022-04-05 NOTE — TOC Transition Note (Signed)
Transition of Care [facility]) - CM/SW Discharge Note   Patient Details  Name: Kerri Carter MRN: [id number] Date of Birth: 08/03/1940  Transition of Care [facility]) CM/SW Contact:  Raina Mina, [place] Phone Number: 04/05/2022, 1:01 PM   Clinical Narrative: Patient requested [facility] for home health services. CSW spoke with [place] with [facility] who stated they can accept patient for home health PT      Barriers to Discharge: Continued Medical Work up   Patient Goals and CMS Choice CMS Medicare.gov Compare Post Acute Care list provided to:: Patient Represenative (must comment) (daughter)    Discharge Placement                         Discharge Plan and Services Additional resources added to the After Visit Summary for                                       Social Determinants of Health (SDOH) Interventions SDOH Screenings   Food Insecurity: No Food Insecurity (03/28/2022)  Housing: Low Risk  (03/28/2022)  Transportation Needs: No Transportation Needs (03/28/2022)  Utilities: Not At Risk (03/28/2022)  Depression (PHQ2-9): Low Risk  (01/06/2022)  Financial Resource Strain: Low Risk  (06/18/2021)  Physical Activity: Inactive (11/24/2019)  Social Connections: Moderately Integrated (06/18/2021)  Stress: No Stress Concern Present (06/18/2021)  Tobacco Use: Medium Risk (03/28/2022)     Readmission Risk Interventions    08/21/2021    3:19 PM 05/24/2021    2:35 PM  Readmission Risk Prevention Plan  Transportation Screening Complete Complete  PCP or Specialist Appt within 3-5 Days  Complete  HRI or [place]  Complete  Social Work Consult for [place] Planning/Counseling  Complete  Palliative Care Screening  Not Applicable  Medication Review Press photographer) Complete Complete  HRI or [place] Complete   SW Recovery Care/Counseling Consult Complete   Palliative Care Screening Not [place] Not Applicable

## 2022-04-05 NOTE — Evaluation (Signed)
Occupational Therapy Evaluation Patient Details Name: Kerri Carter MRN: [id number] DOB: 09-03-1940 Today's Date: 04/05/2022   History of Present Illness Pt is a 82 y.o. female  with medical history significant of chronic respiratory failure with hypoxia on 3 L/min O2 at baseline, COPD on chronic prednisone, OSA, interstitial lung disease, chronic diastolic CHF, hypertension, paroxysmal A-fib, valvular heart disease, PAD, chronic venous insufficiency, renal artery stenosis, GERD, RA, peripheral neuropathy, lumbar radiculopathy, CKD stage IIIb, chronic leukocytosis, bipolar disorder, chronic pain with lumbar radiculopathy.  She presented to the ED on the evening of 03/27/2022 for evaluation of worsening shortness of breath and in respiratory distress.  MD assessment includes: Sepsis secondary to pneumonia, COPD exacerbation, acute on chronic diastolic CHF, elevated troponin likely due to demand ischemia/myocardial injury from sepsis and respiratory failure, and acute urinary retention.   Clinical Impression   Patient presenting with decreased independence in self care, balance, functional mobility/transfers, and endurance. PTA pt lived with family, was Mod I for ADLs, received assistance as needed for IADLs, and was Mod I for functional mobility using a rollator. Pt currently functioning at [place] guard for sit<>stand from EOB and for functional mobility to bedside recliner using RW. She required Min A for LB dressing this date. On 3L O2 via [place] with VSS t/o session. Pt will benefit from acute OT to increase overall independence in the areas of ADLs and functional mobility in order to safely discharge home. OT recommends ongoing therapy upon discharge to maximize safety and independence with ADLs, functional mobility, decrease fall risk, and decrease caregiver burden.     Recommendations for follow up therapy are one component of a multi-disciplinary discharge planning process, led by the attending physician.   Recommendations may be updated based on patient status, additional functional criteria and insurance authorization.   Follow Up Recommendations  Home health OT     Assistance Recommended at Discharge Intermittent Supervision/Assistance  Patient can return home with the following A little help with walking and/or transfers;A little help with bathing/dressing/bathroom;Assistance with cooking/housework;Assist for transportation;Help with stairs or ramp for entrance    Functional Status Assessment  Patient has had a recent decline in their functional status and demonstrates the ability to make significant improvements in function in a reasonable and predictable amount of time.  Equipment Recommendations  None recommended by OT    Recommendations for Other Services       Precautions / Restrictions Precautions Precautions: Fall Restrictions Weight Bearing Restrictions: No      Mobility Bed Mobility               General bed mobility comments: NT, received sitting EOB with RN present and left sitting in recliner    Transfers Overall transfer level: Needs assistance Equipment used: Rolling walker (2 wheels) Transfers: Sit to/from Stand Sit to Stand: Min guard           General transfer comment: STS from EOB      Balance Overall balance assessment: Needs assistance   Sitting balance-Leahy Scale: Normal     Standing balance support: Bilateral upper extremity supported, During functional activity Standing balance-Leahy Scale: Good                             ADL either performed or assessed with clinical judgement   ADL Overall ADL's : Needs assistance/impaired     Grooming: Min guard;Standing Grooming Details (indicate cue type and reason): anticipate  Lower Body Dressing: Minimal assistance;Sitting/lateral leans;Sit to/from stand Lower Body Dressing Details (indicate cue type and reason): to thread B feet through mesh underwear  then hike over hips in standing Toilet Transfer: Rolling walker (2 wheels);Min guard Toilet Transfer Details (indicate cue type and reason): simulated         Functional mobility during ADLs: Min guard;Rolling walker (2 wheels) (to take several steps toward bedside recliner)       Vision Patient Visual Report: No change from baseline       Perception     Praxis      Pertinent Vitals/Pain Pain Assessment Pain Assessment: No/denies pain     Hand Dominance Right   Extremity/Trunk Assessment Upper Extremity Assessment Upper Extremity Assessment: Generalized weakness   Lower Extremity Assessment Lower Extremity Assessment: Generalized weakness       Communication Communication Communication: No difficulties   Cognition Arousal/Alertness: Awake/alert Behavior During Therapy: WFL for tasks assessed/performed Overall Cognitive Status: Within Functional Limits for tasks assessed             General Comments       Exercises Other Exercises Other Exercises: OT provided education re: role of OT, OT POC, post acute recs, sitting up for all meals, EOB/OOB mobility with assistance, home/fall safety.     Shoulder Instructions      Home Living Family/patient expects to be discharged to:: Private residence Living Arrangements: Other relatives Available Help at Discharge: Family;Available PRN/intermittently Type of Home: House Home Access: Ramped entrance     Home Layout: One level     Bathroom Shower/Tub: Occupational psychologist: Handicapped height     Home Equipment: Rollator (4 wheels);BSC/3in1;Shower seat;Toilet riser;Grab bars - toilet;Hand held shower head;Grab bars - tub/shower;Adaptive equipment;Transport chair;Other (comment);Wheelchair - power [employer] Equipment: Reacher Additional Comments: Pt has a twin size bed that elevates at [facility] and FOB. Pt lives with grandchildren who work during the day.      Prior Functioning/Environment Prior  Level of Function : Independent/Modified Independent             Mobility Comments: Mod Ind amb household distances with a rollator or occasional furniture cruising, no falls in the last 6 months ADLs Comments: Mod I ADLs but only showers when gradnson is home for safety, use of reacher PRN for LB dressing. Uses BSC in bedroom/living room when fatigued. Family assists with IADLs (driving, meal prep).        OT Problem List: Decreased strength;Decreased activity tolerance;Impaired balance (sitting and/or standing);Cardiopulmonary status limiting activity      OT Treatment/Interventions: Splinting;Self-care/ADL training;Therapeutic exercise;Neuromuscular education;Energy conservation;DME and/or AE instruction;Manual therapy;Modalities;Balance training;Patient/family education;Visual/perceptual remediation/compensation;Cognitive remediation/compensation;Therapeutic activities    OT Goals(Current goals can be found in the care plan section) Acute Rehab OT Goals Patient Stated Goal: return home OT Goal Formulation: With patient Time For Goal Achievement: 04/19/22 Potential to Achieve Goals: Good   OT Frequency: Min 2X/week    Co-evaluation              AM-PAC OT "6 Clicks" Daily Activity     Outcome Measure Help from another person eating meals?: None Help from another person taking care of personal grooming?: A Little Help from another person toileting, which includes using toliet, bedpan, or urinal?: A Little Help from another person bathing (including washing, rinsing, drying)?: A Little Help from another person to put on and taking off regular upper body clothing?: None Help from another person to put on and taking off regular lower body  clothing?: A Little 6 Click Score: 20   End of Session Equipment Utilized During Treatment: Gait belt;Rolling walker (2 wheels) Nurse Communication: Mobility status  Activity Tolerance: Patient tolerated treatment well Patient left: in  chair;with call bell/phone within reach;with chair alarm set  OT Visit Diagnosis: Other abnormalities of gait and mobility (R26.89);Muscle weakness (generalized) (M62.81)                Time: [id number] OT Time Calculation (min): 12 min Charges:  OT General Charges $OT Visit: 1 Visit OT Evaluation $OT Eval Low Complexity: 1 Low  [facility] MS, OTR/L ascom [phone]  04/05/22, 5:53 PM

## 2022-04-06 DIAGNOSIS — J9621 Acute and chronic respiratory failure with hypoxia: Secondary | ICD-10-CM | POA: Diagnosis not present

## 2022-04-06 DIAGNOSIS — A419 Sepsis, unspecified organism: Secondary | ICD-10-CM | POA: Diagnosis not present

## 2022-04-06 DIAGNOSIS — J189 Pneumonia, unspecified organism: Secondary | ICD-10-CM | POA: Diagnosis not present

## 2022-04-06 DIAGNOSIS — I5033 Acute on chronic diastolic (congestive) heart failure: Secondary | ICD-10-CM | POA: Diagnosis not present

## 2022-04-06 LAB — GLUCOSE, CAPILLARY
Glucose-Capillary: 261 mg/dL — ABNORMAL HIGH (ref 70–99)
Glucose-Capillary: 217 mg/dL — ABNORMAL HIGH (ref 70–99)
Glucose-Capillary: 126 mg/dL — ABNORMAL HIGH (ref 70–99)
Glucose-Capillary: 242 mg/dL — ABNORMAL HIGH (ref 70–99)
Glucose-Capillary: 123 mg/dL — ABNORMAL HIGH (ref 70–99)
Glucose-Capillary: 212 mg/dL — ABNORMAL HIGH (ref 70–99)
Glucose-Capillary: 283 mg/dL — ABNORMAL HIGH (ref 70–99)
Glucose-Capillary: 195 mg/dL — ABNORMAL HIGH (ref 70–99)

## 2022-04-06 MED ORDER — PREDNISONE 10 MG PO TABS
10.0000 mg | ORAL_TABLET | Freq: Every day | ORAL | Status: DC
  Administered 2022-04-06 – 2022-04-07 (×2): 10 mg via ORAL
  Filled 2022-04-06 (×2): qty 1

## 2022-04-06 MED ORDER — ORAL CARE MOUTH RINSE: 15.0000 mL | OROMUCOSAL | Status: DC | PRN

## 2022-04-06 NOTE — Progress Notes (Signed)
Physical Therapy Treatment Patient Details Name: Kerri Carter MRN: [id number] DOB: 07-Apr-1940 Today's Date: 04/06/2022   History of Present Illness Pt is a 82 y.o. female  with medical history significant of chronic respiratory failure with hypoxia on 3 L/min O2 at baseline, COPD on chronic prednisone, OSA, interstitial lung disease, chronic diastolic CHF, hypertension, paroxysmal A-fib, valvular heart disease, PAD, chronic venous insufficiency, renal artery stenosis, GERD, RA, peripheral neuropathy, lumbar radiculopathy, CKD stage IIIb, chronic leukocytosis, bipolar disorder, chronic pain with lumbar radiculopathy.  She presented to the ED on the evening of 03/27/2022 for evaluation of worsening shortness of breath and in respiratory distress.  MD assessment includes: Sepsis secondary to pneumonia, COPD exacerbation, acute on chronic diastolic CHF, elevated troponin likely due to demand ischemia/myocardial injury from sepsis and respiratory failure, and acute urinary retention.    PT Comments    Pt was pleasant and motivated to participate during the session and put forth good effort throughout. Pt c/o feeling very weak at onset of session but eager to participate.  Pt required notably increased effort to come to standing with increased lean on the RW for support once in standing.  During gait training pt presented with very slow, effortful cadence with short, choppy steps that were close to shuffling with very poor clearance.  Pt was only able to amb a max of 4 feet this session before fatiguing and needing to return to sitting with SpO2 and HR WNL on 3LO2/min.  After sitting around 60 sec pt able to amb a second time again for a max of 3-4 feet before needing to return to sitting.  MD notified of pt's decrease in functional strength and activity tolerance compared to prior session with corresponding change of discharge recommendations.  Pt will benefit from PT services in a SNF setting upon discharge to  safely address deficits listed in patient problem list for decreased caregiver assistance and eventual return to PLOF.      Recommendations for follow up therapy are one component of a multi-disciplinary discharge planning process, led by the attending physician.  Recommendations may be updated based on patient status, additional functional criteria and insurance authorization.  Follow Up Recommendations  Skilled nursing-short term rehab (<3 hours/day) Can patient physically be transported by private vehicle: Yes   Assistance Recommended at Discharge Frequent or constant Supervision/Assistance  Patient can return home with the following A little help with bathing/dressing/bathroom;Assistance with cooking/housework;Direct supervision/assist for medications management;Help with stairs or ramp for entrance;Assist for transportation;A lot of help with walking and/or transfers   Equipment Recommendations  None recommended by PT    Recommendations for Other Services       Precautions / Restrictions Precautions Precautions: Fall Restrictions Weight Bearing Restrictions: No     Mobility  Bed Mobility               General bed mobility comments: NT, received in recliner    Transfers Overall transfer level: Needs assistance Equipment used: Rolling walker (2 wheels) Transfers: Sit to/from Stand Sit to Stand: Min guard           General transfer comment: Mod extra time and effort to come to standing with min verbal cues for hand placement    Ambulation/Gait Ambulation/Gait assistance: Min guard Gait Distance (Feet): 4 Feet x 2 Assistive device: Rolling walker (2 wheels) Gait Pattern/deviations: Step-through pattern, Decreased step length - right, Decreased step length - left, Shuffle Gait velocity: decreased     General Gait Details: Very slow, effortful  cadence with choppy shuffling steps this session, max distance 2 x 4 feet with seated rest break between  walks   Stairs             Wheelchair Mobility    Modified Rankin (Stroke Patients Only)       Balance Overall balance assessment: Needs assistance   Sitting balance-Leahy Scale: Normal     Standing balance support: Bilateral upper extremity supported, During functional activity, Reliant on assistive device for balance Standing balance-Leahy Scale: Fair                              Cognition Arousal/Alertness: Awake/alert Behavior During Therapy: WFL for tasks assessed/performed Overall Cognitive Status: Within Functional Limits for tasks assessed                                          Exercises Total Joint Exercises Ankle Circles/Pumps: AROM, Strengthening, Both, 10 reps Quad Sets: Strengthening, Both, 10 reps Gluteal Sets: Strengthening, Both, 10 reps Long Arc Quad: Strengthening, Both, 10 reps Knee Flexion: Strengthening, Both, 10 reps Other Exercises Other Exercises: Standing mini squats with small amplitude 2 x 5 Other Exercises: HEP education/review for BLE APs, QS, GS, and LAQs x 10, 5-6x/day    General Comments        Pertinent Vitals/Pain Pain Assessment Pain Assessment: 0-10 Pain Score: 5  Pain Location: Bilateral shoulders Pain Descriptors / Indicators: Sore Pain Intervention(s): Repositioned, Premedicated before session, Monitored during session    Home Living                          Prior Function            PT Goals (current goals can now be found in the care plan section) Progress towards PT goals: Not progressing toward goals - comment (Decreased functional strength this session)    Frequency    Min 2X/week      PT Plan Discharge plan needs to be updated    Co-evaluation              AM-PAC PT "6 Clicks" Mobility   Outcome Measure  Help needed turning from your back to your side while in a flat bed without using bedrails?: A Little Help needed moving from lying on your  back to sitting on the side of a flat bed without using bedrails?: A Little Help needed moving to and from a bed to a chair (including a wheelchair)?: A Little Help needed standing up from a chair using your arms (e.g., wheelchair or bedside chair)?: A Little Help needed to walk in hospital room?: A Lot Help needed climbing 3-5 steps with a railing? : Total 6 Click Score: 15    End of Session Equipment Utilized During Treatment: Gait belt;Oxygen Activity Tolerance: Patient tolerated treatment well Patient left: in chair;with call bell/phone within reach;with chair alarm set Nurse Communication: Mobility status PT Visit Diagnosis: Difficulty in walking, not elsewhere classified (R26.2);Muscle weakness (generalized) (M62.81);Pain Pain - Right/Left: Right (bilateral)     Time: [id number] PT Time Calculation (min) (ACUTE ONLY): 29 min  Charges:  $Gait Training: 8-22 mins $Therapeutic Exercise: 8-22 mins                     D. Scott Valborg Friar PT, DPT 04/06/22,  11:11 AM

## 2022-04-06 NOTE — Care Management Important Message (Signed)
Important Message  Patient Details  Name: CELISSE GRIBBEN MRN: [id number] Date of Birth: [birth year]   Medicare Important Message Given:  Yes     Loann Quill 04/06/2022, 4:05 PM

## 2022-04-06 NOTE — Progress Notes (Signed)
Occupational Therapy Treatment Patient Details Name: Kerri Carter MRN: [id number] DOB: Aug 19, 1940 Today's Date: 04/06/2022   History of present illness Pt is a 82 y.o. female  with medical history significant of chronic respiratory failure with hypoxia on 3 L/min O2 at baseline, COPD on chronic prednisone, OSA, interstitial lung disease, chronic diastolic CHF, hypertension, paroxysmal A-fib, valvular heart disease, PAD, chronic venous insufficiency, renal artery stenosis, GERD, RA, peripheral neuropathy, lumbar radiculopathy, CKD stage IIIb, chronic leukocytosis, bipolar disorder, chronic pain with lumbar radiculopathy.  She presented to the ED on the evening of 03/27/2022 for evaluation of worsening shortness of breath and in respiratory distress.  MD assessment includes: Sepsis secondary to pneumonia, COPD exacerbation, acute on chronic diastolic CHF, elevated troponin likely due to demand ischemia/myocardial injury from sepsis and respiratory failure, and acute urinary retention.   OT comments  Patient received sitting in recliner and agreeable to OT. Tx session targeted increasing activity tolerance for improved ADL completion. Pt engaged in LB dressing with set up-supervision using reacher and sock aid (uses AE at baseline). Pt endorsed fatigue after LB dressing and deferred further self-care tasks. Pt on 3L O2 via [place] t/o. SpO2 maintained >90% with activity. Pt was educated on energy conservation techniques with handout provided explaining the 4 P's (plan, prioritize, pace, position) as well as strategies for how to conserve energy for specific ADL tasks. Will continue to address during hospital stay as pt would benefit from further opportunities to practice implementing energy conservation techniques during self-care tasks. Pt verbalized understanding. Pt left as received with all needs in reach. Pt would benefit from SNF stay prior to D/C home 2/2 decreased activity tolerance and overall weakness.    Recommendations for follow up therapy are one component of a multi-disciplinary discharge planning process, led by the attending physician.  Recommendations may be updated based on patient status, additional functional criteria and insurance authorization.    Follow Up Recommendations  Skilled nursing-short term rehab (<3 hours/day)     Assistance Recommended at Discharge Frequent or constant Supervision/Assistance  Patient can return home with the following  A little help with walking and/or transfers;A little help with bathing/dressing/bathroom;Assistance with cooking/housework;Assist for transportation;Help with stairs or ramp for entrance   Equipment Recommendations  None recommended by OT    Recommendations for Other Services      Precautions / Restrictions Precautions Precautions: Fall Restrictions Weight Bearing Restrictions: No       Mobility Bed Mobility               General bed mobility comments: NT, received/left in recliner    Transfers Overall transfer level: Needs assistance Equipment used: Rolling walker (2 wheels) Transfers: Sit to/from Stand Sit to Stand: Min guard           General transfer comment: STS from recliner     Balance Overall balance assessment: Needs assistance Sitting-balance support: Feet supported Sitting balance-Leahy Scale: Normal     Standing balance support: During functional activity, Single extremity supported, No upper extremity supported Standing balance-Leahy Scale: Fair                             ADL either performed or assessed with clinical judgement   ADL Overall ADL's : Needs assistance/impaired                     Lower Body Dressing: Set up;Sitting/lateral leans;Sit to/from stand;Supervision/safety;With adaptive equipment Lower Body Dressing Details (  indicate cue type and reason): Engaged in LB dressing with AE (uses at baseline, also has long handled shoe horn) - items placed  within reach, doffed socks with reacher, threaded B feet through mesh underwear with reacher and increased time, pt then able to place sock onto sock aid device and don B socks in sitting, pt then stood to hike underwear over hips in standing with supervision Toilet Transfer: Rolling walker (2 wheels);Min guard Toilet Transfer Details (indicate cue type and reason): simulated           General ADL Comments: Pt deferred further self-care tasks 2/2 fatigue    Extremity/Trunk Assessment Upper Extremity Assessment Upper Extremity Assessment: Generalized weakness   Lower Extremity Assessment Lower Extremity Assessment: Generalized weakness        Vision Patient Visual Report: No change from baseline     Perception     Praxis      Cognition Arousal/Alertness: Awake/alert Behavior During Therapy: WFL for tasks assessed/performed Overall Cognitive Status: Within Functional Limits for tasks assessed                                          Exercises Other Exercises Other Exercises: Education provided re: energy conservation techniques with handout provided    Shoulder Instructions       General Comments Pt on 3L O2 via [place] t/o, SpO2 maintained >90% with activity.    Pertinent Vitals/ Pain       Pain Assessment Pain Assessment: 0-10 Pain Score: 5  Pain Location: Bilateral shoulders Pain Descriptors / Indicators: Sore Pain Intervention(s): Monitored during session, Repositioned  Home Living                                          Prior Functioning/Environment              Frequency  Min 2X/week        Progress Toward Goals  OT Goals(current goals can now be found in the care plan section)  Progress towards OT goals: Progressing toward goals  Acute Rehab OT Goals Patient Stated Goal: go to rehab to get stronger before returning home OT Goal Formulation: With patient Time For Goal Achievement: 04/19/22 Potential to  Achieve Goals: Good  Plan Discharge plan needs to be updated;Frequency remains appropriate    Co-evaluation                 AM-PAC OT "6 Clicks" Daily Activity     Outcome Measure   Help from another person eating meals?: None Help from another person taking care of personal grooming?: A Little Help from another person toileting, which includes using toliet, bedpan, or urinal?: A Little Help from another person bathing (including washing, rinsing, drying)?: A Little Help from another person to put on and taking off regular upper body clothing?: None Help from another person to put on and taking off regular lower body clothing?: A Little 6 Click Score: 20    End of Session Equipment Utilized During Treatment: Gait belt;Rolling walker (2 wheels);Oxygen  OT Visit Diagnosis: Other abnormalities of gait and mobility (R26.89);Muscle weakness (generalized) (M62.81)   Activity Tolerance Patient tolerated treatment well;Patient limited by fatigue   Patient Left in chair;with call bell/phone within reach;with chair alarm set   Nurse Communication Mobility  status        Time: [id number] OT Time Calculation (min): 15 min  Charges: OT General Charges $OT Visit: 1 Visit OT Treatments $Self Care/Home Management : 8-22 mins  [facility] MS, OTR/L ascom [phone]  04/06/22, 3:37 PM

## 2022-04-06 NOTE — TOC Progression Note (Signed)
Transition of Care [facility]) - Progression Note    Patient Details  Name: GWENDOLINE LESKO MRN: [id number] Date of Birth: 1940/09/29  Transition of Care [facility]) CM/SW [place], LCSW Phone Number: 04/06/2022, 12:06 PM  Clinical Narrative:  Patient is agreeable to SNF placement. [facility] is first preference. MD confirmed patient no longer needs bipap.   Expected Discharge Plan:  (TBD) Barriers to Discharge: Continued Medical Work up  Expected Discharge Plan and Services                                               Social Determinants of Health (SDOH) Interventions SDOH Screenings   Food Insecurity: No Food Insecurity (03/28/2022)  Housing: Low Risk  (03/28/2022)  Transportation Needs: No Transportation Needs (03/28/2022)  Utilities: Not At Risk (03/28/2022)  Depression (PHQ2-9): Low Risk  (01/06/2022)  Financial Resource Strain: Low Risk  (06/18/2021)  Physical Activity: Inactive (11/24/2019)  Social Connections: Moderately Integrated (06/18/2021)  Stress: No Stress Concern Present (06/18/2021)  Tobacco Use: Medium Risk (03/28/2022)    Readmission Risk Interventions    08/21/2021    3:19 PM 05/24/2021    2:35 PM  Readmission Risk Prevention Plan  Transportation Screening Complete Complete  PCP or Specialist Appt within 3-5 Days  Complete  HRI or [place]  Complete  Social Work Consult for [place] Planning/Counseling  Complete  Palliative Care Screening  Not Applicable  Medication Review Press photographer) Complete Complete  HRI or [place] Complete   SW Recovery Care/Counseling Consult Complete   Palliative Care Screening Not [place] Not Applicable

## 2022-04-06 NOTE — NC FL2 (Signed)
[place] LEVEL OF CARE FORM     IDENTIFICATION  Patient Name: Kerri Carter Birthdate: [birth year] Sex: female Admission Date (Current Location): 03/28/2022  [place] and [place] Number:  Engineering geologist and Address:  [facility], [address] [place], [place] [address]      Provider Number: [id number]  Attending Physician Name and Address:  Jennye Boroughs, MD  Relative Name and Phone Number:       Current Level of Care: Hospital Recommended Level of Care: [place] Prior Approval Number:    Date Approved/Denied:   PASRR Number: [id number]  Discharge Plan: SNF    Current Diagnoses: Patient Active Problem List   Diagnosis Date Noted   Obesity (BMI 30-39.9) 03/31/2022   NSVT (nonsustained ventricular tachycardia) ([place]) 03/30/2022   Acute urinary retention 03/30/2022   Sepsis due to pneumonia ([place]) 03/28/2022   Demand ischemia 03/28/2022   History of major orthopedic surgery 03/19/2022   Iron deficiency anemia due to chronic blood loss 10/09/2021   Chronic heart failure with preserved ejection fraction ([place]) 09/24/2021   Acute on chronic respiratory failure with hypoxia and hypercapnia ([place]) 09/10/2021   Acute on chronic diastolic CHF (congestive heart failure) ([place]) 08/20/2021   Paroxysmal atrial fibrillation ([place]) 07/29/2021   History of lumbar fusion 05/23/2021   Impaired mobility 12/18/2020   Urinary incontinence 12/18/2020   Lumbar radiculopathy 12/11/2020   Chronic pain of both shoulders 08/29/2019   Hypertriglyceridemia 07/24/2019   Bipolar disorder, in full remission, most recent episode depressed ([place]) 07/13/2019   Essential hypertension 06/16/2019   Chronic pain 06/16/2019   Lymphedema 06/05/2019   Chronic venous insufficiency 05/25/2019   PAD (peripheral artery disease) ([place]) 05/25/2019   Interstitial pulmonary disease ([place]) 07/07/2018   Leukocytosis 10/19/2017   Allergic rhinitis 09/28/2017    Age-related osteoporosis without current pathological fracture 11/05/2016   Peripheral neuropathy 10/28/2016   Osteoporosis 08/27/2016   Drug-induced osteoporosis 08/27/2016   Adnexal mass 08/25/2016   Prediabetes 08/25/2016   Coronary atherosclerosis 08/25/2016   Stage 3b chronic kidney disease ([place])    Hyperlipidemia 07/17/2015   Macular degeneration 07/17/2015   Rheumatoid arthritis ([place]) 12/05/2014   Osteoarthritis of knee 06/07/2013   Valvular heart disease 10/13/2011   COPD with acute exacerbation ([place]) 09/09/2011   OSA (obstructive sleep apnea) 09/09/2011   Gastroesophageal reflux disease 02/24/2011   Single kidney 02/24/2011   Renal artery stenosis ([place]) 02/24/2011    Orientation RESPIRATION BLADDER Height & Weight     Self, Time, Situation, Place  O2 (Nasal Cannula 3 L. MD confirmed she no longer needs bipap.) Incontinent, External catheter Weight: 166 lb 11.2 oz (75.6 kg) Height:  '4\' 11"'$  (149.9 cm)  BEHAVIORAL SYMPTOMS/MOOD NEUROLOGICAL BOWEL NUTRITION STATUS   (None)  (None) Continent Diet (Heart healthy)  AMBULATORY STATUS COMMUNICATION OF NEEDS Skin   Limited Assist Verbally Bruising, Other (Comment) (Erythema/redness.)                       Personal Care Assistance Level of Assistance  Bathing, Feeding, Dressing Bathing Assistance: Limited assistance Feeding assistance: Limited assistance Dressing Assistance: Limited assistance     Functional Limitations Info  Sight, Hearing, Speech Sight Info: Adequate Hearing Info: Adequate Speech Info: Adequate    SPECIAL CARE FACTORS FREQUENCY  PT (By licensed PT), OT (By licensed OT)     PT Frequency: 5 x week OT Frequency: 5 x week  Contractures Contractures Info: Not present    Additional Factors Info  Code Status, Allergies Code Status Info: DNR Allergies Info: Ceftin (Cefuroxime Axetil), Lisinopril, Sulfa Antibiotics, Sulfasalazine, Morphine, Xarelto (Rivaroxaban), Adhesive (Tape),  Antihistamines, Chlorpheniramine-type, Antivert (Meclizine Hcl), Aspirin, Contrast Media (Iodinated Contrast Media), Decongestant (Pseudoephedrine Hcl), Doxycycline, Levaquin (Levofloxacin In D5w), Polymyxin B, Tetanus Toxoids           Current Medications (04/06/2022):  This is the current hospital active medication list Current Facility-Administered Medications  Medication Dose Route Frequency Provider Last Rate Last Admin   0.9 %  sodium chloride infusion   Intravenous PRN Jennye Boroughs, MD   Stopped at 03/31/22 0309   acetaminophen (TYLENOL) tablet 650 mg  650 mg Oral Q6H PRN Jennye Boroughs, MD   650 mg at 04/05/22 1751   Or   acetaminophen (TYLENOL) suppository 650 mg  650 mg Rectal Q6H PRN Jennye Boroughs, MD       albuterol (PROVENTIL) (2.5 MG/3ML) 0.083% nebulizer solution 2.5 mg  2.5 mg Nebulization Q4H PRN Jennye Boroughs, MD   2.5 mg at 03/31/22 1717   amLODipine (NORVASC) tablet 5 mg  5 mg Oral Daily Jennye Boroughs, MD   5 mg at 04/06/22 [id number]   apixaban (ELIQUIS) tablet 5 mg  5 mg Oral BID Jennye Boroughs, MD   5 mg at 04/06/22 0907   budesonide (PULMICORT) nebulizer solution 0.5 mg  0.5 mg Nebulization BID Jennye Boroughs, MD   0.5 mg at 04/06/22 0740   busPIRone (BUSPAR) tablet 5 mg  5 mg Oral BID Jennye Boroughs, MD   5 mg at 04/06/22 [id number]   cholecalciferol (VITAMIN D3) 25 MCG (1000 UNIT) tablet 4,000 Units  4,000 Units Oral Daily Jennye Boroughs, MD   4,000 Units at 04/06/22 0915   dicyclomine (BENTYL) capsule 10 mg  10 mg Oral BID PRN Jennye Boroughs, MD   10 mg at 04/06/22 0909   escitalopram (LEXAPRO) tablet 10 mg  10 mg Oral Daily Jennye Boroughs, MD   10 mg at 04/06/22 [id number]   famotidine (PEPCID) tablet 20 mg  20 mg Oral QAC breakfast Jennye Boroughs, MD   20 mg at 04/06/22 [id number]   ferrous sulfate tablet 325 mg  325 mg Oral Daily Jennye Boroughs, MD   325 mg at 04/06/22 0905   fluticasone (FLONASE) 50 MCG/ACT nasal spray 2 spray  2 spray Each Nare Daily PRN Jennye Boroughs, MD        gabapentin (NEURONTIN) capsule 300 mg  300 mg Oral Daily Jennye Boroughs, MD   300 mg at 04/06/22 [id number]   And   gabapentin (NEURONTIN) capsule 600 mg  600 mg Oral QHS Jennye Boroughs, MD   600 mg at 04/05/22 2106   guaiFENesin (MUCINEX) 12 hr tablet 600 mg  600 mg Oral BID Jennye Boroughs, MD   600 mg at 04/06/22 [id number]   insulin aspart (novoLOG) injection 0-5 Units  0-5 Units Subcutaneous QHS Jennye Boroughs, MD   2 Units at 04/05/22 2117   insulin aspart (novoLOG) injection 0-9 Units  0-9 Units Subcutaneous TID WC Jennye Boroughs, MD   1 Units at 04/06/22 0910   ipratropium-albuterol (DUONEB) 0.5-2.5 (3) MG/3ML nebulizer solution 3 mL  3 mL Nebulization BID Jennye Boroughs, MD   3 mL at 04/06/22 0740   lamoTRIgine (LAMICTAL) tablet 100 mg  100 mg Oral BID Jennye Boroughs, MD   100 mg at 04/06/22 0908   lidocaine (LIDODERM) 5 % 1 patch  1 patch Transdermal Daily Jennye Boroughs, MD  1 patch at 04/06/22 0909   loratadine (CLARITIN) tablet 10 mg  10 mg Oral Daily Jennye Boroughs, MD   10 mg at 04/06/22 0908   magnesium hydroxide (MILK OF MAGNESIA) suspension 30 mL  30 mL Oral Daily PRN Jennye Boroughs, MD       methocarbamol (ROBAXIN) tablet 500 mg  500 mg Oral QID PRN Jennye Boroughs, MD   500 mg at 04/06/22 [id number]   mirabegron ER (MYRBETRIQ) tablet 25 mg  25 mg Oral Daily Jennye Boroughs, MD   25 mg at 04/06/22 0909   montelukast (SINGULAIR) tablet 10 mg  10 mg Oral Daily Jennye Boroughs, MD   10 mg at 04/06/22 [id number]   morphine (PF) 2 MG/ML injection 1 mg  1 mg Intravenous Q3H PRN Jennye Boroughs, MD       multivitamin (PROSIGHT) tablet 1 tablet  1 tablet Oral Daily Jennye Boroughs, MD   1 tablet at 04/06/22 0908   nebivolol (BYSTOLIC) tablet 5 mg  5 mg Oral BID Jennye Boroughs, MD   5 mg at 04/06/22 0908   ondansetron (ZOFRAN) tablet 4 mg  4 mg Oral Q6H PRN Jennye Boroughs, MD       Or   ondansetron (ZOFRAN) injection 4 mg  4 mg Intravenous Q6H PRN Jennye Boroughs, MD       Oral care mouth rinse  15 mL Mouth Rinse  PRN Jennye Boroughs, MD       pantoprazole (PROTONIX) EC tablet 40 mg  40 mg Oral BID AC Jennye Boroughs, MD   40 mg at 04/06/22 0905   pravastatin (PRAVACHOL) tablet 20 mg  20 mg Oral q1800 Jennye Boroughs, MD   20 mg at 04/05/22 1751   predniSONE (DELTASONE) tablet 10 mg  10 mg Oral Q breakfast Jennye Boroughs, MD   10 mg at 04/06/22 0908   QUEtiapine (SEROQUEL) tablet 25 mg  25 mg Oral QHS Jennye Boroughs, MD   25 mg at 04/05/22 2107   sodium chloride (OCEAN) 0.65 % nasal spray 1 spray  1 spray Each Nare PRN Jennye Boroughs, MD       sucralfate (CARAFATE) tablet 1 g  1 g Oral BID Jennye Boroughs, MD   1 g at 04/06/22 0905   torsemide (DEMADEX) tablet 20 mg  20 mg Oral Daily Jennye Boroughs, MD   20 mg at 04/06/22 [id number]   traMADol (ULTRAM) tablet 50 mg  50 mg Oral BID PRN Jennye Boroughs, MD   50 mg at 04/06/22 0908   traZODone (DESYREL) tablet 25 mg  25 mg Oral QHS PRN Jennye Boroughs, MD   25 mg at 04/05/22 2106     Discharge Medications: Please see discharge summary for a list of discharge medications.  Relevant Imaging Results:  Relevant Lab Results:   Additional Information SS#: [id number]  Candie Chroman, LCSW

## 2022-04-06 NOTE — Progress Notes (Signed)
Progress Note    Kerri Carter  [id number] DOB: [birth year]  DOA: 03/28/2022 PCP: Carollee Leitz, MD      Brief Narrative:    Medical records reviewed and are as summarized below:  Kerri Carter is a 82 y.o. female  with medical history significant of chronic respiratory failure with hypoxia on 3 L/min O2 at baseline, COPD on chronic prednisone, OSA, interstitial lung disease, chronic diastolic CHF, hypertension, paroxysmal A-fib, valvular heart disease, PAD, chronic venous insufficiency, renal artery stenosis, GERD, RA, peripheral neuropathy, lumbar radiculopathy, CKD stage IIIb, chronic leukocytosis, bipolar disorder, chronic pain with lumbar radiculopathy.  She presented to the ED on the evening of 03/27/2022 for evaluation of worsening shortness of breath and in respiratory distress.     She was found to have acute on chronic hypoxic respiratory failure due to multifocal pneumonia and secondary COPD exacerbation.  Started on IV antibiotics and IV steroids and admitted to the hospital.  2/25: remains on 4 L/min O2 with sats 91-92%. Ongoing dyspnea at rest and with conversation.  2/26: rapid response was called on 2/26 because of increased O2 needs, pt unresponsive with increased WOB.  Placed on BiPAP, transferred to stepdown.  ABG with pCO2 73, pH 7.1.  Pulmonology consulted.       Assessment/Plan:   Principal Problem:   Sepsis due to pneumonia [facility]) Active Problems:   COPD with acute exacerbation ([place])   Acute on chronic respiratory failure with hypoxia and hypercapnia (HCC)   NSVT (nonsustained ventricular tachycardia) (HCC)   Stage 3b chronic kidney disease (HCC)   Interstitial pulmonary disease (HCC)   Paroxysmal atrial fibrillation (HCC)   Chronic heart failure with preserved ejection fraction (HCC)   Demand ischemia   Acute on chronic diastolic CHF (congestive heart failure) (HCC)   Essential hypertension   Rheumatoid arthritis (HCC)   History of lumbar  fusion   Hyperlipidemia   Gastroesophageal reflux disease   OSA (obstructive sleep apnea)   Valvular heart disease   Peripheral neuropathy   Chronic venous insufficiency   PAD (peripheral artery disease) (HCC)   Bipolar disorder, in full remission, most recent episode depressed (HCC)   Chronic pain   Lumbar radiculopathy   Urinary incontinence   Iron deficiency anemia due to chronic blood loss   Acute urinary retention   Obesity (BMI 30-39.9)    Body mass index is 33.67 kg/m.  (Obesity)   Sepsis secondary to pneumonia: Completed 7 days of IV Unasyn on 04/03/2022.  Completed IV azithromycin.   Acute on chronic hypoxic respiratory failure, acute hypercapnic respiratory failure: Continue 3 L/min oxygen via [place].  She did not use any BiPAP last night.  She did well and she did not express any shortness of breath or hypoxia on 3 L oxygen via [place] overnight.   Mild OSA: Patient said her CPAP machine is dysfunctional and she has not used it for over 5 years.  She does not qualify for home trilogy machine.  She has been advised to follow-up with her PCP or pulmonologist for sleep study so that she can be fitted with a new CPAP machine.  ABG showed pH 7.49, pCO2 48, pO2 78, O2 sat 97.4% on 6 L oxygen.     COPD exacerbation: Decrease prednisone from 20 to 10 mg daily.  Continue bronchodilators as needed.     Acute on chronic diastolic CHF, valvular heart disease (moderate to severe MR, moderate TR, mild to moderate AS): Continue torsemide.  Monitor BMP,  daily weight and urine output.  BNP was 933 on 03/30/2022.   2D echo in June 2023 showed EF estimated at 55 to [id number], grade 1 diastolic dysfunction   Elevated troponin: This is likely due to demand ischemia/myocardial injury from sepsis and respiratory failure   Paroxysmal atrial fibrillation: Continue Eliquis   Acute urinary retention, history of urinary incontinence: Urinary retention has resolved.  She no longer has a Foley  catheter.   Other comorbidities include interstitial lung disease, CKD stage IIIb with single left kidney, hypertension, iron deficiency anemia, rheumatoid arthritis, lumbar radiculopathy, bipolar disorder, PVD, chronic venous insufficiency, OSA (on CPAP  is old and dysfunctional)   General weakness: PT recommended discharge to SNF.  Follow-up with social worker to assist with disposition.    Diet Order             Diet Heart Room service appropriate? Yes; Fluid consistency: Thin  Diet effective now                            Consultants: Pulmonologist, palliative care  Procedures: None    Medications:    amLODipine  5 mg Oral Daily   apixaban  5 mg Oral BID   budesonide (PULMICORT) nebulizer solution  0.5 mg Nebulization BID   busPIRone  5 mg Oral BID   cholecalciferol  4,000 Units Oral Daily   escitalopram  10 mg Oral Daily   famotidine  20 mg Oral QAC breakfast   ferrous sulfate  325 mg Oral Daily   gabapentin  300 mg Oral Daily   And   gabapentin  600 mg Oral QHS   guaiFENesin  600 mg Oral BID   insulin aspart  0-5 Units Subcutaneous QHS   insulin aspart  0-9 Units Subcutaneous TID WC   ipratropium-albuterol  3 mL Nebulization BID   lamoTRIgine  100 mg Oral BID   lidocaine  1 patch Transdermal Daily   loratadine  10 mg Oral Daily   mirabegron ER  25 mg Oral Daily   montelukast  10 mg Oral Daily   multivitamin  1 tablet Oral Daily   nebivolol  5 mg Oral BID   pantoprazole  40 mg Oral BID AC   pravastatin  20 mg Oral q1800   predniSONE  10 mg Oral Q breakfast   QUEtiapine  25 mg Oral QHS   sucralfate  1 g Oral BID   torsemide  20 mg Oral Daily   Continuous Infusions:  sodium chloride Stopped (03/31/22 0309)     Anti-infectives (From admission, onward)    Start     Dose/Rate Route Frequency Ordered Stop   03/29/22 0600  azithromycin (ZITHROMAX) 500 mg in sodium chloride 0.9 % 250 mL IVPB        500 mg 250 mL/hr over 60 Minutes  Intravenous Every 24 hours 03/28/22 0252 04/01/22 0609   03/28/22 1200  Ampicillin-Sulbactam (UNASYN) 3 g in sodium chloride 0.9 % 100 mL IVPB  Status:  Discontinued        3 g 200 mL/hr over 30 Minutes Intravenous Every 12 hours 03/28/22 0252 03/28/22 0756   03/28/22 0900  Ampicillin-Sulbactam (UNASYN) 3 g in sodium chloride 0.9 % 100 mL IVPB  Status:  Discontinued        3 g 200 mL/hr over 30 Minutes Intravenous Every 6 hours 03/28/22 0756 04/03/22 1252   03/28/22 0145  Ampicillin-Sulbactam (UNASYN) 3 g in sodium chloride 0.9 %  100 mL IVPB        3 g 200 mL/hr over 30 Minutes Intravenous  Once 03/28/22 0141 03/28/22 0210   03/28/22 0145  azithromycin (ZITHROMAX) 500 mg in sodium chloride 0.9 % 250 mL IVPB        500 mg 250 mL/hr over 60 Minutes Intravenous  Once 03/28/22 0141 03/28/22 0419              Family Communication/Anticipated D/C date and plan/Code Status   DVT prophylaxis:  apixaban (ELIQUIS) tablet 5 mg     Code Status: DNR  Family Communication: None Disposition Plan: Will need SNF at discharge   Status is: Inpatient Remains inpatient appropriate because: Awaiting placement to SNF       Subjective:   Interval events noted.  She feels better.  No shortness of breath or chest pain.  She complains of general weakness.  She send PT had recommended home health but she thinks she needs to go to rehab.  Objective:    Vitals:   04/05/22 2326 04/06/22 0447 04/06/22 0811 04/06/22 0830  BP: (!) 130/51 (!) 111/49 (!) 120/41   Pulse: 65 (!) 59 65   Resp: '20 16 18   '$ Temp: 97.6 F (36.4 C) 97.8 F (36.6 C) (!) 97.5 F (36.4 C)   TempSrc:  Oral    SpO2: 98% 98% 98%   Weight:    75.6 kg  Height:       No data found.   Intake/Output Summary (Last 24 hours) at 04/06/2022 1126 Last data filed at 04/06/2022 1021 Gross per 24 hour  Intake 480 ml  Output 1900 ml  Net -1420 ml   Filed Weights   04/04/22 0500 04/05/22 1800 04/06/22 0830  Weight: 78.9 kg  80.4 kg 75.6 kg    Exam:    GEN: NAD SKIN: Warm and dry EYES: No pallor or icterus ENT: MMM CV: RRR PULM: No rales or wheezing heard ABD: soft, ND, NT, +BS CNS: AAO x 3, non focal EXT: No edema or tenderness   Data Reviewed:   I have personally reviewed following labs and imaging studies:  Labs: Labs show the following:   Basic Metabolic Panel: Recent Labs  Lab 03/31/22 0317 04/01/22 0504 04/02/22 0428 04/03/22 0527 04/04/22 0405 04/05/22 0553  NA 141 141 141 140 136 138  K 3.9 4.2 4.1 3.7 3.9 4.9  CL 103 102 101 100 96* 97*  CO2 '27 29 30 30 30 '$ 32  GLUCOSE 193* 191* 197* 138* 170* 146*  BUN 34* 37* 39* 30* 31* 35*  CREATININE 1.37* 1.46* 1.21* 1.20* 1.20* 1.23*  CALCIUM 7.3* 7.3* 7.3* 7.3* 8.0* 8.6*  MG 2.3  --  2.7* 2.3 2.5* 2.9*   GFR Estimated Creatinine Clearance: 32.9 mL/min (A) (by C-G formula based on SCr of 1.23 mg/dL (H)). Liver Function Tests: No results for input(s): "AST", "ALT", "ALKPHOS", "BILITOT", "PROT", "ALBUMIN" in the last 168 hours.  No results for input(s): "LIPASE", "AMYLASE" in the last 168 hours. No results for input(s): "AMMONIA" in the last 168 hours. Coagulation profile No results for input(s): "INR", "PROTIME" in the last 168 hours.   CBC: Recent Labs  Lab 03/31/22 0317 04/01/22 0504 04/03/22 0527  WBC 15.7* 16.5* 14.4*  NEUTROABS  --   --  10.1*  HGB 9.1* 9.6* 10.1*  HCT 29.6* 30.6* 32.4*  MCV 90.0 89.5 89.8  PLT 367 393 339   Cardiac Enzymes: No results for input(s): "CKTOTAL", "CKMB", "CKMBINDEX", "TROPONINI" in the  last 168 hours. BNP (last 3 results) No results for input(s): "PROBNP" in the last 8760 hours. CBG: Recent Labs  Lab 04/05/22 0812 04/05/22 1148 04/05/22 1724 04/05/22 2041 04/06/22 0810  GLUCAP 144* 166* 272* 207* 123*   D-Dimer: No results for input(s): "DDIMER" in the last 72 hours. Hgb A1c: No results for input(s): "HGBA1C" in the last 72 hours.  Lipid Profile: No results for  input(s): "CHOL", "HDL", "LDLCALC", "TRIG", "CHOLHDL", "LDLDIRECT" in the last 72 hours. Thyroid function studies: No results for input(s): "TSH", "T4TOTAL", "T3FREE", "THYROIDAB" in the last 72 hours.  Invalid input(s): "FREET3" Anemia work up: No results for input(s): "VITAMINB12", "FOLATE", "FERRITIN", "TIBC", "IRON", "RETICCTPCT" in the last 72 hours. Sepsis Labs: Recent Labs  Lab 03/31/22 0317 04/01/22 0504 04/03/22 0527  WBC 15.7* 16.5* 14.4*    Microbiology Recent Results (from the past 240 hour(s))  Resp panel by RT-PCR (RSV, Flu A&B, Covid) Anterior Nasal Swab     Status: None   Collection Time: 03/28/22  1:03 AM   Specimen: Anterior Nasal Swab  Result Value Ref Range Status   SARS Coronavirus 2 by RT PCR NEGATIVE NEGATIVE Final    Comment: (NOTE) SARS-CoV-2 target nucleic acids are NOT DETECTED.  The SARS-CoV-2 RNA is generally detectable in upper respiratory specimens during the acute phase of infection. The lowest concentration of SARS-CoV-2 viral copies this assay can detect is 138 copies/mL. A negative result does not preclude SARS-Cov-2 infection and should not be used as the sole basis for treatment or other patient management decisions. A negative result may occur with  improper specimen collection/handling, submission of specimen other than nasopharyngeal swab, presence of viral mutation(s) within the areas targeted by this assay, and inadequate number of viral copies(<138 copies/mL). A negative result must be combined with clinical observations, patient history, and epidemiological information. The expected result is Negative.  Fact Sheet for Patients:  EntrepreneurPulse.com.au  Fact Sheet for Healthcare Providers:  IncredibleEmployment.be  This test is no t yet approved or cleared by the [place] FDA and  has been authorized for detection and/or diagnosis of SARS-CoV-2 by FDA under an Emergency Use  Authorization (EUA). This EUA will remain  in effect (meaning this test can be used) for the duration of the COVID-19 declaration under Section 564(b)(1) of the Act, 21 U.S.C.section 360bbb-3(b)(1), unless the authorization is terminated  or revoked sooner.       Influenza A by PCR NEGATIVE NEGATIVE Final   Influenza B by PCR NEGATIVE NEGATIVE Final    Comment: (NOTE) The Xpert Xpress SARS-CoV-2/FLU/RSV plus assay is intended as an aid in the diagnosis of influenza from Nasopharyngeal swab specimens and should not be used as a sole basis for treatment. Nasal washings and aspirates are unacceptable for Xpert Xpress SARS-CoV-2/FLU/RSV testing.  Fact Sheet for Patients: EntrepreneurPulse.com.au  Fact Sheet for Healthcare Providers: IncredibleEmployment.be  This test is not yet approved or cleared by the [place] FDA and has been authorized for detection and/or diagnosis of SARS-CoV-2 by FDA under an Emergency Use Authorization (EUA). This EUA will remain in effect (meaning this test can be used) for the duration of the COVID-19 declaration under Section 564(b)(1) of the Act, 21 U.S.C. section 360bbb-3(b)(1), unless the authorization is terminated or revoked.     Resp Syncytial Virus by PCR NEGATIVE NEGATIVE Final    Comment: (NOTE) Fact Sheet for Patients: EntrepreneurPulse.com.au  Fact Sheet for Healthcare Providers: IncredibleEmployment.be  This test is not yet approved or cleared by the [place] FDA  and has been authorized for detection and/or diagnosis of SARS-CoV-2 by FDA under an Emergency Use Authorization (EUA). This EUA will remain in effect (meaning this test can be used) for the duration of the COVID-19 declaration under Section 564(b)(1) of the Act, 21 U.S.C. section 360bbb-3(b)(1), unless the authorization is terminated or revoked.  Performed at [facility], [address] [place], [place] [address]   Culture, blood (routine x 2)     Status: None   Collection Time: 03/28/22  1:03 AM   Specimen: BLOOD  Result Value Ref Range Status   Specimen Description BLOOD BLOOD RIGHT FOREARM  Final   Special Requests   Final    BOTTLES DRAWN AEROBIC AND ANAEROBIC Blood Culture adequate volume   Culture   Final    NO GROWTH 5 DAYS Performed at [facility], [address], [place], [place] [address]    Report Status 04/02/2022 FINAL  Final  Culture, blood (routine x 2)     Status: None   Collection Time: 03/28/22  1:05 AM   Specimen: BLOOD  Result Value Ref Range Status   Specimen Description BLOOD RIGHT ANTECUBITAL  Final   Special Requests   Final    BOTTLES DRAWN AEROBIC AND ANAEROBIC Blood Culture adequate volume   Culture   Final    NO GROWTH 5 DAYS Performed at [facility], [address], [place], [place] [address]    Report Status 04/02/2022 FINAL  Final  MRSA Next Gen by PCR, Nasal     Status: None   Collection Time: 03/30/22  9:50 AM   Specimen: Nasal Mucosa; Nasal Swab  Result Value Ref Range Status   MRSA by PCR Next Gen NOT DETECTED NOT DETECTED Final    Comment: Performed at [facility], [address] [place], [place] [address]    Procedures and diagnostic studies:  No results found.             LOS: 9 days   Oaklyn Mans  [employer], advertising on [email or web address]. If 7PM-7AM, please contact night-coverage at [email or web address]     04/06/2022, 11:26 AM

## 2022-04-07 DIAGNOSIS — I5033 Acute on chronic diastolic (congestive) heart failure: Secondary | ICD-10-CM | POA: Diagnosis not present

## 2022-04-07 DIAGNOSIS — J9621 Acute and chronic respiratory failure with hypoxia: Secondary | ICD-10-CM | POA: Diagnosis not present

## 2022-04-07 DIAGNOSIS — J441 Chronic obstructive pulmonary disease with (acute) exacerbation: Secondary | ICD-10-CM | POA: Diagnosis not present

## 2022-04-07 DIAGNOSIS — J189 Pneumonia, unspecified organism: Secondary | ICD-10-CM | POA: Diagnosis not present

## 2022-04-07 DIAGNOSIS — Z7189 Other specified counseling: Secondary | ICD-10-CM | POA: Diagnosis not present

## 2022-04-07 DIAGNOSIS — A419 Sepsis, unspecified organism: Secondary | ICD-10-CM | POA: Diagnosis not present

## 2022-04-07 LAB — BASIC METABOLIC PANEL
Anion gap: 11 (ref 5–15)
BUN: 32 mg/dL — ABNORMAL HIGH (ref 8–23)
CO2: 30 mmol/L (ref 22–32)
Calcium: 8.7 mg/dL — ABNORMAL LOW (ref 8.9–10.3)
Chloride: 97 mmol/L — ABNORMAL LOW (ref 98–111)
Creatinine, Ser: 1.36 mg/dL — ABNORMAL HIGH (ref 0.44–1.00)
GFR, Estimated: 40 mL/min — ABNORMAL LOW (ref >60)
Glucose, Bld: 142 mg/dL — ABNORMAL HIGH (ref 70–99)
Potassium: 4.6 mmol/L (ref 3.5–5.1)
Sodium: 138 mmol/L (ref 135–145)

## 2022-04-07 LAB — MAGNESIUM: Magnesium: 2.3 mg/dL (ref 1.7–2.4)

## 2022-04-07 LAB — GLUCOSE, CAPILLARY
Glucose-Capillary: 187 mg/dL — ABNORMAL HIGH (ref 70–99)
Glucose-Capillary: 191 mg/dL — ABNORMAL HIGH (ref 70–99)
Glucose-Capillary: 161 mg/dL — ABNORMAL HIGH (ref 70–99)
Glucose-Capillary: 154 mg/dL — ABNORMAL HIGH (ref 70–99)
Glucose-Capillary: 194 mg/dL — ABNORMAL HIGH (ref 70–99)

## 2022-04-07 LAB — BLOOD GAS, ARTERIAL
Acid-Base Excess: 9.4 mmol/L — ABNORMAL HIGH (ref 0.0–2.0)
Bicarbonate: 32.4 mmol/L — ABNORMAL HIGH (ref 20.0–28.0)
O2 Content: 3 L/min
O2 Saturation: 98.3 %
Patient temperature: 37
pCO2 arterial: 37 mmHg (ref 32–48)
pH, Arterial: 7.55 — ABNORMAL HIGH (ref 7.35–7.45)
pO2, Arterial: 78 mmHg — ABNORMAL LOW (ref 83–108)

## 2022-04-07 LAB — PHOSPHORUS: Phosphorus: 4.2 mg/dL (ref 2.5–4.6)

## 2022-04-07 NOTE — TOC Progression Note (Signed)
Transition of Care [facility]) - Progression Note    Patient Details  Name: Kerri Carter MRN: [id number] Date of Birth: 1940/05/09  Transition of [facility]) CM/SW Contact  Laurena Slimmer, RN Phone Number: 04/07/2022, 10:20 AM  Clinical Narrative:    Patient advised bed offer for [place] secured.  Auth started via navi portal [name] [phone] approved for 3/6-3/8 MD notfiied.   Expected Discharge Plan:  (TBD) Barriers to Discharge: Continued Medical Work up  Expected Discharge Plan and Services                                               Social Determinants of Health (SDOH) Interventions SDOH Screenings   Food Insecurity: No Food Insecurity (03/28/2022)  Housing: Low Risk  (03/28/2022)  Transportation Needs: No Transportation Needs (03/28/2022)  Utilities: Not At Risk (03/28/2022)  Depression (PHQ2-9): Low Risk  (01/06/2022)  Financial Resource Strain: Low Risk  (06/18/2021)  Physical Activity: Inactive (11/24/2019)  Social Connections: Moderately Integrated (06/18/2021)  Stress: No Stress Concern Present (06/18/2021)  Tobacco Use: Medium Risk (03/28/2022)    Readmission Risk Interventions    08/21/2021    3:19 PM 05/24/2021    2:35 PM  Readmission Risk Prevention Plan  Transportation Screening Complete Complete  PCP or Specialist Appt within 3-5 Days  Complete  HRI or [place]  Complete  Social Work Consult for [place] Planning/Counseling  Complete  Palliative Care Screening  Not Applicable  Medication Review Press photographer) Complete Complete  HRI or [place] Complete   SW Recovery Care/Counseling Consult Complete   Palliative Care Screening Not [name] Not Applicable

## 2022-04-07 NOTE — Progress Notes (Signed)
PT Cancellation Note  Patient Details Name: Kerri Carter MRN: [id number] DOB: 01-25-1941   Cancelled Treatment:    Reason Eval/Treat Not Completed: Other (comment) Per discussion with RN pt has had a very difficult day and though most of her lab work has been okay she has been having a difficult time keeping eyes open and feels very exhausted and indicates that PT would be unlikely today.  Entered pt's room, reports "There's no way I can take PT today."  Will maintain on PT caseload and continue to follow as appropriate.    Kreg Shropshire, DPT 04/07/2022, 4:02 PM

## 2022-04-07 NOTE — Progress Notes (Signed)
Daily Progress Note   Patient Name: Kerri Carter       Date: 04/07/2022 DOB: 1940/03/14  Age: 82 y.o. MRN#: [id number] Attending Physician: Jennye Boroughs, MD Primary Care Physician: Carollee Leitz, MD Admit Date: 03/28/2022  Reason for Consultation/Follow-up: Establishing goals of care  Subjective: Notes and labs reviewed.  In to see patient, she is resting in bed at this time.  She has been moved out of ICU.  She states she is feeling better and is eager to go to rehab.  She has no complaints at this time.  She has no questions at this time.  PMT will shadow for decline.  Length of Stay: 10  Current Medications: Scheduled Meds:   amLODipine  5 mg Oral Daily   apixaban  5 mg Oral BID   budesonide (PULMICORT) nebulizer solution  0.5 mg Nebulization BID   busPIRone  5 mg Oral BID   cholecalciferol  4,000 Units Oral Daily   escitalopram  10 mg Oral Daily   famotidine  20 mg Oral QAC breakfast   ferrous sulfate  325 mg Oral Daily   gabapentin  300 mg Oral Daily   And   gabapentin  600 mg Oral QHS   guaiFENesin  600 mg Oral BID   insulin aspart  0-5 Units Subcutaneous QHS   insulin aspart  0-9 Units Subcutaneous TID WC   ipratropium-albuterol  3 mL Nebulization BID   lamoTRIgine  100 mg Oral BID   lidocaine  1 patch Transdermal Daily   loratadine  10 mg Oral Daily   mirabegron ER  25 mg Oral Daily   montelukast  10 mg Oral Daily   multivitamin  1 tablet Oral Daily   nebivolol  5 mg Oral BID   pantoprazole  40 mg Oral BID AC   pravastatin  20 mg Oral q1800   predniSONE  10 mg Oral Q breakfast   QUEtiapine  25 mg Oral QHS   sucralfate  1 g Oral BID   torsemide  20 mg Oral Daily    Continuous Infusions:  sodium chloride Stopped (03/31/22 0309)    PRN Meds: sodium chloride,  acetaminophen **OR** acetaminophen, albuterol, dicyclomine, fluticasone, magnesium hydroxide, methocarbamol, morphine injection, ondansetron **OR** ondansetron (ZOFRAN) IV, mouth rinse, sodium chloride, traMADol, traZODone  Physical Exam Pulmonary:  Effort: Pulmonary effort is normal.  Neurological:     Mental Status: She is alert.             Vital Signs: BP (!) 107/42 (BP Location: Right Arm)   Pulse 71   Temp 97.7 F (36.5 C)   Resp 18   Ht '4\' 11"'$  (1.499 m)   Wt 75.3 kg   SpO2 97%   BMI 33.53 kg/m  SpO2: SpO2: 97 % O2 Device: O2 Device: Nasal Cannula O2 Flow Rate: O2 Flow Rate (L/min): 3 L/min  Intake/output summary:  Intake/Output Summary (Last 24 hours) at 04/07/2022 1231 Last data filed at 04/07/2022 1147 Gross per 24 hour  Intake 220 ml  Output 1700 ml  Net -1480 ml   LBM: Last BM Date : 04/06/22 Baseline Weight: Weight: 80 kg Most recent weight: Weight: 75.3 kg    Patient Active Problem List   Diagnosis Date Noted   Obesity (BMI 30-39.9) 03/31/2022   NSVT (nonsustained ventricular tachycardia) ([place]) 03/30/2022   Acute urinary retention 03/30/2022   Sepsis due to pneumonia ([place]) 03/28/2022   Demand ischemia 03/28/2022   History of major orthopedic surgery 03/19/2022   Iron deficiency anemia due to chronic blood loss 10/09/2021   Chronic heart failure with preserved ejection fraction ([place]) 09/24/2021   Acute on chronic respiratory failure with hypoxia and hypercapnia (HCC) 09/10/2021   Acute on chronic diastolic CHF (congestive heart failure) ([place]) 08/20/2021   Paroxysmal atrial fibrillation ([place]) 07/29/2021   History of lumbar fusion 05/23/2021   Impaired mobility 12/18/2020   Urinary incontinence 12/18/2020   Lumbar radiculopathy 12/11/2020   Chronic pain of both shoulders 08/29/2019   Hypertriglyceridemia 07/24/2019   Bipolar disorder, in full remission, most recent episode depressed ([place]) 07/13/2019   Essential hypertension 06/16/2019   Chronic pain  06/16/2019   Lymphedema 06/05/2019   Chronic venous insufficiency 05/25/2019   PAD (peripheral artery disease) ([place]) 05/25/2019   Interstitial pulmonary disease ([place]) 07/07/2018   Leukocytosis 10/19/2017   Allergic rhinitis 09/28/2017   Age-related osteoporosis without current pathological fracture 11/05/2016   Peripheral neuropathy 10/28/2016   Osteoporosis 08/27/2016   Drug-induced osteoporosis 08/27/2016   Adnexal mass 08/25/2016   Prediabetes 08/25/2016   Coronary atherosclerosis 08/25/2016   Stage 3b chronic kidney disease ([place])    Hyperlipidemia 07/17/2015   Macular degeneration 07/17/2015   Rheumatoid arthritis ([place]) 12/05/2014   Osteoarthritis of knee 06/07/2013   Valvular heart disease 10/13/2011   COPD with acute exacerbation ([place]) 09/09/2011   OSA (obstructive sleep apnea) 09/09/2011   Gastroesophageal reflux disease 02/24/2011   Single kidney 02/24/2011   Renal artery stenosis ([place]) 02/24/2011    Palliative Care Assessment & Plan    Recommendations/Plan: Continue with DNR/DNI status.  Continue to treat the treatable.  Code Status:    Code Status Orders  (From admission, onward)           Start     Ordered   03/28/22 0250  Do not attempt resuscitation (DNR)  Continuous       Question Answer Comment  If patient has no pulse and is not breathing Do Not Attempt Resuscitation   If patient has a pulse and/or is breathing: Medical Treatment Goals LIMITED ADDITIONAL INTERVENTIONS: Use medication/IV fluids and cardiac monitoring as indicated; Do not use intubation or mechanical ventilation (DNI), also provide comfort medications.  Transfer to Progressive/Stepdown as indicated, avoid Intensive Care.   Consent: Discussion documented in EHR or advanced directives reviewed      03/28/22 0252  Code Status History     Date Active Date Inactive Code Status Order ID Comments User Context   09/08/2021 0912 09/12/2021 2210 DNR [id number]  Collier Bullock, MD  ED   08/20/2021 0947 08/22/2021 2237 DNR [id number]  Norval Morton, MD ED   07/28/2021 1805 07/31/2021 2134 DNR [id number]  Neena Rhymes, MD ED   07/22/2021 2029 07/24/2021 2142 DNR [id number]  Cox, [place], DO ED   05/27/2021 1832 06/02/2021 2105 DNR [id number]  Flora Lipps, MD Inpatient   05/23/2021 1306 05/27/2021 1832 Full Code [id number]  Loleta Dicker, [place] Inpatient   01/12/2021 2243 01/13/2021 2354 Full Code [id number]  Para Skeans, MD ED   12/11/2020 1422 12/13/2020 2016 Full Code [id number]  Loleta Dicker, [place] Inpatient   10/29/2020 2232 11/04/2020 1947 Full Code [id number]  Mansy, Arvella Merles, MD ED   12/21/2019 1736 12/26/2019 1707 Full Code [id number]  Corky Mull, MD Inpatient   07/26/2018 1110 07/28/2018 1750 Full Code [id number]  Corky Mull, MD Inpatient   11/04/2017 1231 11/05/2017 2155 Full Code [id number]  Corky Mull, MD Inpatient   10/31/2016 2128 11/04/2016 1655 Full Code [id number]  Fritzi Mandes, MD Inpatient   05/07/2016 0944 05/14/2016 2144 Full Code [id number]  Harrie Foreman Inpatient   09/26/2015 2259 09/27/2015 2044 Full Code [id number]  Lance Coon, MD ED      Advance Directive Documentation    Flowsheet Row Most Recent Value  Type of Advance Directive Living will, Healthcare Power of Attorney  Pre-existing out of facility DNR order (yellow form or pink MOST form) --  "MOST" Form in Place? --     Thank you for allowing the Palliative Medicine Team to assist in the care of this patient.   Asencion Gowda, NP  Please contact Palliative Medicine Team phone at [phone] for questions and concerns.

## 2022-04-07 NOTE — Progress Notes (Signed)
Progress Note    [name]  [id number] DOB: October 17, 1940  DOA: 03/28/2022 PCP: Carollee Leitz, MD      Brief Narrative:    Medical records reviewed and are as summarized below:  Kerri Carter is a 82 y.o. female  with medical history significant of chronic respiratory failure with hypoxia on 3 L/min O2 at baseline, COPD on chronic prednisone, OSA, interstitial lung disease, chronic diastolic CHF, hypertension, paroxysmal A-fib, valvular heart disease, PAD, chronic venous insufficiency, renal artery stenosis, GERD, RA, peripheral neuropathy, lumbar radiculopathy, CKD stage IIIb, chronic leukocytosis, bipolar disorder, chronic pain with lumbar radiculopathy.  She presented to the ED on the evening of 03/27/2022 for evaluation of worsening shortness of breath and in respiratory distress.     She was found to have acute on chronic hypoxic respiratory failure due to multifocal pneumonia and secondary COPD exacerbation.  Started on IV antibiotics and IV steroids and admitted to the hospital.  2/25: remains on 4 L/min O2 with sats 91-92%. Ongoing dyspnea at rest and with conversation.  2/26: rapid response was called on 2/26 because of increased O2 needs, pt unresponsive with increased WOB.  Placed on BiPAP, transferred to stepdown.  ABG with pCO2 73, pH 7.1.  Pulmonology consulted.       Assessment/Plan:   Principal Problem:   Sepsis due to pneumonia [facility]) Active Problems:   COPD with acute exacerbation ([place])   Acute on chronic respiratory failure with hypoxia and hypercapnia (HCC)   NSVT (nonsustained ventricular tachycardia) (HCC)   Stage 3b chronic kidney disease (HCC)   Interstitial pulmonary disease (HCC)   Paroxysmal atrial fibrillation (HCC)   Chronic heart failure with preserved ejection fraction (HCC)   Demand ischemia   Acute on chronic diastolic CHF (congestive heart failure) (HCC)   Essential hypertension   Rheumatoid arthritis (HCC)   History of lumbar  fusion   Hyperlipidemia   Gastroesophageal reflux disease   OSA (obstructive sleep apnea)   Valvular heart disease   Peripheral neuropathy   Chronic venous insufficiency   PAD (peripheral artery disease) (HCC)   Bipolar disorder, in full remission, most recent episode depressed (HCC)   Chronic pain   Lumbar radiculopathy   Urinary incontinence   Iron deficiency anemia due to chronic blood loss   Acute urinary retention   Obesity (BMI 30-39.9)    Body mass index is 33.53 kg/m.  (Obesity)   Sepsis secondary to pneumonia: Completed 7 days of IV Unasyn on 04/03/2022.  Completed IV azithromycin.   Acute on chronic hypoxic respiratory failure, acute hypercapnic respiratory failure: Continue 3 L/min oxygen via [place].  She has not used nighttime BiPAP for 2 nights in a row.    Mild OSA: Patient said her CPAP machine is dysfunctional and she has not used it for over 5 years.  She does not qualify for home trilogy machine.  She has been advised to follow-up with her PCP or pulmonologist for sleep study so that she can be fitted with a new CPAP machine.  ABG showed pH 7.49, pCO2 48, pO2 78, O2 sat 97.4% on 6 L oxygen.     COPD exacerbation: Completed course of steroids on 04/07/2022.  Continue bronchodilators as needed.   Acute on chronic diastolic CHF, valvular heart disease (moderate to severe MR, moderate TR, mild to moderate AS): Continue torsemide.  Monitor BMP, daily weight and urine output.  BNP was 933 on 03/30/2022.   2D echo in June 2023 showed EF estimated  at 13 to [id number], grade 1 diastolic dysfunction   Elevated troponin: This is likely due to demand ischemia/myocardial injury from sepsis and respiratory failure   Paroxysmal atrial fibrillation: Continue Eliquis   Acute urinary retention, history of urinary incontinence: Urinary retention has resolved.  She no longer has a Foley catheter.   Other comorbidities include interstitial lung disease, CKD stage IIIb with single left  kidney, hypertension, iron deficiency anemia, rheumatoid arthritis, lumbar radiculopathy, bipolar disorder, PVD, chronic venous insufficiency, OSA (on CPAP  is old and dysfunctional)   General weakness: PT recommended discharge to SNF.  Follow-up with social worker to assist with disposition.    Diet Order             Diet Heart Room service appropriate? Yes; Fluid consistency: Thin  Diet effective now                            Consultants: Pulmonologist, palliative care  Procedures: None    Medications:    amLODipine  5 mg Oral Daily   apixaban  5 mg Oral BID   budesonide (PULMICORT) nebulizer solution  0.5 mg Nebulization BID   busPIRone  5 mg Oral BID   cholecalciferol  4,000 Units Oral Daily   escitalopram  10 mg Oral Daily   famotidine  20 mg Oral QAC breakfast   ferrous sulfate  325 mg Oral Daily   gabapentin  300 mg Oral Daily   And   gabapentin  600 mg Oral QHS   guaiFENesin  600 mg Oral BID   insulin aspart  0-5 Units Subcutaneous QHS   insulin aspart  0-9 Units Subcutaneous TID WC   ipratropium-albuterol  3 mL Nebulization BID   lamoTRIgine  100 mg Oral BID   lidocaine  1 patch Transdermal Daily   loratadine  10 mg Oral Daily   mirabegron ER  25 mg Oral Daily   montelukast  10 mg Oral Daily   multivitamin  1 tablet Oral Daily   nebivolol  5 mg Oral BID   pantoprazole  40 mg Oral BID AC   pravastatin  20 mg Oral q1800   predniSONE  10 mg Oral Q breakfast   QUEtiapine  25 mg Oral QHS   sucralfate  1 g Oral BID   torsemide  20 mg Oral Daily   Continuous Infusions:  sodium chloride Stopped (03/31/22 0309)     Anti-infectives (From admission, onward)    Start     Dose/Rate Route Frequency Ordered Stop   03/29/22 0600  azithromycin (ZITHROMAX) 500 mg in sodium chloride 0.9 % 250 mL IVPB        500 mg 250 mL/hr over 60 Minutes Intravenous Every 24 hours 03/28/22 0252 04/01/22 0609   03/28/22 1200  Ampicillin-Sulbactam (UNASYN) 3 g in  sodium chloride 0.9 % 100 mL IVPB  Status:  Discontinued        3 g 200 mL/hr over 30 Minutes Intravenous Every 12 hours 03/28/22 0252 03/28/22 0756   03/28/22 0900  Ampicillin-Sulbactam (UNASYN) 3 g in sodium chloride 0.9 % 100 mL IVPB  Status:  Discontinued        3 g 200 mL/hr over 30 Minutes Intravenous Every 6 hours 03/28/22 0756 04/03/22 1252   03/28/22 0145  Ampicillin-Sulbactam (UNASYN) 3 g in sodium chloride 0.9 % 100 mL IVPB        3 g 200 mL/hr over 30 Minutes Intravenous  Once 03/28/22  0141 03/28/22 0210   03/28/22 0145  azithromycin (ZITHROMAX) 500 mg in sodium chloride 0.9 % 250 mL IVPB        500 mg 250 mL/hr over 60 Minutes Intravenous  Once 03/28/22 0141 03/28/22 0419              Family Communication/Anticipated D/C date and plan/Code Status   DVT prophylaxis:  apixaban (ELIQUIS) tablet 5 mg     Code Status: DNR  Family Communication: None Disposition Plan: Plan to discharge to SNF tomorrow   Status is: Inpatient Remains inpatient appropriate because: Awaiting placement to SNF       Subjective:   Interval events noted.  No shortness of breath or chest pain.  She feels better.  Objective:    Vitals:   04/07/22 0331 04/07/22 0821 04/07/22 0944 04/07/22 1147  BP: (!) 111/50  (!) 118/45 (!) 107/42  Pulse: 71  69 71  Resp: '16  18 18  '$ Temp: 98.1 F (36.7 C)  98.2 F (36.8 C) 97.7 F (36.5 C)  TempSrc:      SpO2: 98%  97% 97%  Weight:  75.3 kg    Height:       No data found.   Intake/Output Summary (Last 24 hours) at 04/07/2022 1305 Last data filed at 04/07/2022 1147 Gross per 24 hour  Intake 220 ml  Output 1700 ml  Net -1480 ml   Filed Weights   04/05/22 1800 04/06/22 0830 04/07/22 0821  Weight: 80.4 kg 75.6 kg 75.3 kg    Exam:    GEN: NAD SKIN: No rash EYES: EOMI ENT: MMM CV: RRR PULM: CTA B ABD: soft, ND, NT, +BS CNS: AAO x 3, non focal EXT: No edema or tenderness    Data Reviewed:   I have personally reviewed  following labs and imaging studies:  Labs: Labs show the following:   Basic Metabolic Panel: Recent Labs  Lab 04/01/22 0504 04/02/22 0428 04/03/22 0527 04/04/22 0405 04/05/22 0553  NA 141 141 140 136 138  K 4.2 4.1 3.7 3.9 4.9  CL 102 101 100 96* 97*  CO2 '29 30 30 30 '$ 32  GLUCOSE 191* 197* 138* 170* 146*  BUN 37* 39* 30* 31* 35*  CREATININE 1.46* 1.21* 1.20* 1.20* 1.23*  CALCIUM 7.3* 7.3* 7.3* 8.0* 8.6*  MG  --  2.7* 2.3 2.5* 2.9*   GFR Estimated Creatinine Clearance: 32.8 mL/min (A) (by C-G formula based on SCr of 1.23 mg/dL (H)). Liver Function Tests: No results for input(s): "AST", "ALT", "ALKPHOS", "BILITOT", "PROT", "ALBUMIN" in the last 168 hours.  No results for input(s): "LIPASE", "AMYLASE" in the last 168 hours. No results for input(s): "AMMONIA" in the last 168 hours. Coagulation profile No results for input(s): "INR", "PROTIME" in the last 168 hours.   CBC: Recent Labs  Lab 04/01/22 0504 04/03/22 0527  WBC 16.5* 14.4*  NEUTROABS  --  10.1*  HGB 9.6* 10.1*  HCT 30.6* 32.4*  MCV 89.5 89.8  PLT 393 339   Cardiac Enzymes: No results for input(s): "CKTOTAL", "CKMB", "CKMBINDEX", "TROPONINI" in the last 168 hours. BNP (last 3 results) No results for input(s): "PROBNP" in the last 8760 hours. CBG: Recent Labs  Lab 04/06/22 1246 04/06/22 1633 04/06/22 2001 04/07/22 0941 04/07/22 1145  GLUCAP 212* 283* 195* 187* 191*   D-Dimer: No results for input(s): "DDIMER" in the last 72 hours. Hgb A1c: No results for input(s): "HGBA1C" in the last 72 hours.  Lipid Profile: No results for input(s): "CHOL", "  HDL", "LDLCALC", "TRIG", "CHOLHDL", "LDLDIRECT" in the last 72 hours. Thyroid function studies: No results for input(s): "TSH", "T4TOTAL", "T3FREE", "THYROIDAB" in the last 72 hours.  Invalid input(s): "FREET3" Anemia work up: No results for input(s): "VITAMINB12", "FOLATE", "FERRITIN", "TIBC", "IRON", "RETICCTPCT" in the last 72 hours. Sepsis  Labs: Recent Labs  Lab 04/01/22 0504 04/03/22 0527  WBC 16.5* 14.4*    Microbiology Recent Results (from the past 240 hour(s))  MRSA Next Gen by PCR, Nasal     Status: None   Collection Time: 03/30/22  9:50 AM   Specimen: Nasal Mucosa; Nasal Swab  Result Value Ref Range Status   MRSA by PCR Next Gen NOT DETECTED NOT DETECTED Final    Comment: Performed at [facility], [address] [place], [place] [address]    Procedures and diagnostic studies:  No results found.             LOS: 10 days   Quantez Schnyder  [facility]   Pager on [email or web address]. If 7PM-7AM, please contact night-coverage at [email or web address]     04/07/2022, 1:05 PM

## 2022-04-08 DIAGNOSIS — Z7901 Long term (current) use of anticoagulants: Secondary | ICD-10-CM | POA: Insufficient documentation

## 2022-04-08 DIAGNOSIS — A419 Sepsis, unspecified organism: Secondary | ICD-10-CM | POA: Diagnosis not present

## 2022-04-08 DIAGNOSIS — J189 Pneumonia, unspecified organism: Secondary | ICD-10-CM | POA: Diagnosis not present

## 2022-04-08 LAB — CBC
HCT: 34.3 % — ABNORMAL LOW (ref 36.0–46.0)
Hemoglobin: 10.9 g/dL — ABNORMAL LOW (ref 12.0–15.0)
MCH: 28 pg (ref 26.0–34.0)
MCHC: 31.8 g/dL (ref 30.0–36.0)
MCV: 88.2 fL (ref 80.0–100.0)
Platelets: 305 10*3/uL (ref 150–400)
RBC: 3.89 MIL/uL (ref 3.87–5.11)
RDW: 15.4 % (ref 11.5–15.5)
WBC: 13.7 10*3/uL — ABNORMAL HIGH (ref 4.0–10.5)
nRBC: 0 % (ref 0.0–0.2)

## 2022-04-08 LAB — GLUCOSE, CAPILLARY: Glucose-Capillary: 122 mg/dL — ABNORMAL HIGH (ref 70–99)

## 2022-04-08 MED ORDER — TRAMADOL HCL 50 MG PO TABS: 50.0000 mg | ORAL_TABLET | Freq: Two times a day (BID) | ORAL | 0 refills | Status: DC

## 2022-04-08 NOTE — Plan of Care (Signed)
Per notes, patient approved to D/C to SNF. Recommend outpatient palliative to follow at D/C.

## 2022-04-08 NOTE — TOC Transition Note (Signed)
Transition of Care [facility]) - CM/SW Discharge Note   Patient Details  Name: Kerri Carter MRN: [id number] Date of Birth: 08-Feb-1940  Transition of Care [facility]) CM/SW Contact:  Laurena Slimmer, RN Phone Number: 04/08/2022, 10:08 AM   Clinical Narrative:    Per facility patient can be accepted today Spoke with Tiffany  in admissions Patient assigned room # 508 Nurse will call report to [phone] Patient, MD, and nurse. Face sheet and medical necessity forms printed to the floor to be added to the EMS pack EMS arranged  Discharge summary and SNF tranfer report sent in [place].  TOC signing off.       Barriers to Discharge: Continued Medical Work up   Patient Goals and CMS Choice CMS Medicare.gov Compare Post Acute Care list provided to:: Patient Represenative (must comment) (daughter)    Discharge Placement                         Discharge Plan and Services Additional resources added to the After Visit Summary for                                       Social Determinants of Health (SDOH) Interventions SDOH Screenings   Food Insecurity: No Food Insecurity (03/28/2022)  Housing: Low Risk  (03/28/2022)  Transportation Needs: No Transportation Needs (03/28/2022)  Utilities: Not At Risk (03/28/2022)  Depression (PHQ2-9): Low Risk  (01/06/2022)  Financial Resource Strain: Low Risk  (06/18/2021)  Physical Activity: Inactive (11/24/2019)  Social Connections: Moderately Integrated (06/18/2021)  Stress: No Stress Concern Present (06/18/2021)  Tobacco Use: Medium Risk (03/28/2022)     Readmission Risk Interventions    08/21/2021    3:19 PM 05/24/2021    2:35 PM  Readmission Risk Prevention Plan  Transportation Screening Complete Complete  PCP or Specialist Appt within 3-5 Days  Complete  HRI or [place]  Complete  Social Work Consult for [place] Planning/Counseling  Complete  Palliative Care Screening  Not Applicable  Medication Review Human resources officer) Complete Complete  HRI or [place] Complete   SW Recovery Care/Counseling Consult Complete   Palliative Care Screening [name] Not Applicable

## 2022-04-08 NOTE — Discharge Summary (Signed)
Physician Discharge Summary   Patient: Kerri Carter MRN: [id number] DOB: 16-May-1940  Admit date:     03/28/2022  Discharge date: 04/08/22  Discharge Physician: Fritzi Mandes   PCP: Carollee Leitz, MD   Recommendations at discharge:    Use your oxygen, nebulizer, inhalers as before F/u PCP in 1 week  Discharge Diagnoses: Principal Problem:   Sepsis due to pneumonia [facility]) Active Problems:   COPD with acute exacerbation ([place])   Acute on chronic respiratory failure with hypoxia and hypercapnia (HCC)   NSVT (nonsustained ventricular tachycardia) (HCC)   Stage 3b chronic kidney disease ([place])   Interstitial pulmonary disease ([place])   Paroxysmal atrial fibrillation ([place])   Chronic heart failure with preserved ejection fraction (HCC)   Demand ischemia   Acute on chronic diastolic CHF (congestive heart failure) (HCC)   Essential hypertension   Rheumatoid arthritis ([place])   History of lumbar fusion   Hyperlipidemia   Gastroesophageal reflux disease   OSA (obstructive sleep apnea)   Valvular heart disease   Peripheral neuropathy   Chronic venous insufficiency   PAD (peripheral artery disease) (HCC)   Bipolar disorder, in full remission, most recent episode depressed (HCC)   Chronic pain   Lumbar radiculopathy   Urinary incontinence   Iron deficiency anemia due to chronic blood loss   Acute urinary retention   Obesity (BMI 30-39.9)   Hospital Course: Kerri Carter is a 82 y.o. female  with medical history significant of chronic respiratory failure with hypoxia on 3 L/min O2 at baseline, COPD on chronic prednisone, OSA, interstitial lung disease, chronic diastolic CHF, hypertension, paroxysmal A-fib, valvular heart disease, PAD, chronic venous insufficiency, renal artery stenosis, GERD, RA, peripheral neuropathy, lumbar radiculopathy, CKD stage IIIb, chronic leukocytosis, bipolar disorder, chronic pain with lumbar radiculopathy.  She presented to the ED on the evening of 03/27/2022 for  evaluation of worsening shortness of breath and in respiratory distress.      She was found to have acute on chronic hypoxic respiratory failure due to multifocal pneumonia and secondary COPD exacerbation. Pt was started on IV antibiotics and IV steroids and admitted to the hospital.    Sepsis secondary to pneumonia: Completed 7 days of IV Unasyn and azithromycin.     Acute on chronic hypoxic respiratory failure, acute hypercapnic respiratory failure/ COPD exacerbation: --Continue chronic 3 L/min oxygen via .  She has not used nighttime BiPAP for 2 nights in a row.  --pt on low dose prednisone at home (5 mg) resumed    Mild OSA: Patient said her CPAP machine is dysfunctional and she has not used it for over 5 years.  She does not qualify for home trilogy machine.  She has been advised to follow-up with her PCP or pulmonologist for sleep study so that she can be fitted with a new CPAP machine.  ABG showed pH 7.49, pCO2 48, pO2 78, O2 sat 97.4% on 6 L oxygen.        Acute on chronic diastolic CHF, valvular heart disease (moderate to severe MR, moderate TR, mild to moderate AS): Continue torsemide.  Monitor BMP, daily weight and urine output.  BNP was 933 on 03/30/2022.   2D echo in June 2023 showed EF estimated at 55 to 123456, grade 1 diastolic dysfunction --resolved. Pt at baseline   Elevated troponin: This is likely due to demand ischemia/myocardial injury from sepsis and respiratory failure   Paroxysmal atrial fibrillation: Continue Eliquis   Acute urinary retention, history of urinary incontinence: Urinary retention  has resolved.  She no longer has a Foley catheter.    Other comorbidities include interstitial lung disease, CKD stage IIIb with single left kidney, hypertension, iron deficiency anemia, rheumatoid arthritis, lumbar radiculopathy, bipolar disorder, PVD, chronic venous insufficiency, OSA (on CPAP  is old and dysfunctional)   General weakness: PT recommended discharge to SNF.    Per TOC to [employer] today. Pt agreeable. Family aware    Seen by Palliative care--pt is DNR. F/u Palliative care as out pt   Consultants: PCCM Disposition: Skilled nursing facility Diet recommendation:  Discharge Diet Orders (From admission, onward)     Start     Ordered   04/08/22 0000  Diet - low sodium heart healthy        04/08/22 0922           Cardiac diet DISCHARGE MEDICATION: Allergies as of 04/08/2022       Reactions   Ceftin [cefuroxime Axetil] Anaphylaxis   Lisinopril Anaphylaxis   Sulfa Antibiotics Other (See Comments)   Face swelling   Sulfasalazine Anaphylaxis   Morphine Other (See Comments)   Per patient, low blood pressure issues that requires action to raise it back up. Can take small infrequent doses   Xarelto [rivaroxaban] Other (See Comments)   Stomach burning, bleeding, and tar in stool   Adhesive [tape] Rash   Paper tape and tega derm OK   Antihistamines, Chlorpheniramine-type Other (See Comments)   Makes pt hyper   Antivert [meclizine Hcl] Other (See Comments)   Bladder will not empty   Aspirin Other (See Comments)   Sulfasalazine allergy cross reacts   Contrast Media [iodinated Contrast Media] Rash   she is able to use betadine scrubs.   Decongestant [pseudoephedrine Hcl] Other (See Comments)   Makes pt hyper   Doxycycline Other (See Comments)   GI upset   Levaquin [levofloxacin In D5w] Rash   Polymyxin B Other (See Comments)   Facial rash   Tetanus Toxoids Rash, Other (See Comments)   Fever and hot to touch at injection site        Medication List     TAKE these medications    albuterol 108 (90 Base) MCG/ACT inhaler Commonly known as: Ventolin HFA Inhale 2 puffs into the lungs every 6 (six) hours as needed for wheezing or shortness of breath.   albuterol (2.5 MG/3ML) 0.083% nebulizer solution Commonly known as: PROVENTIL Take by nebulization.   amLODipine 5 MG tablet Commonly known as: NORVASC Take 1 tablet (5 mg  total) by mouth daily.   apixaban 5 MG Tabs tablet Commonly known as: ELIQUIS Take 1 tablet (5 mg total) by mouth 2 (two) times daily.   budesonide 0.5 MG/2ML nebulizer solution Commonly known as: PULMICORT Take 2 mLs (0.5 mg total) by nebulization 2 (two) times daily.   busPIRone 5 MG tablet Commonly known as: BUSPAR Take 1 tablet (5 mg total) by mouth 2 (two) times daily.   cetirizine 10 MG tablet Commonly known as: ZyrTEC Allergy Take 1 tablet (10 mg total) by mouth at bedtime.   dicyclomine 10 MG capsule Commonly known as: BENTYL TAKE 1 CAPSULE (10 MG TOTAL) BY MOUTH 2 (TWO) TIMES DAILY AS NEEDED FOR SPASMS.   Enbrel SureClick 50 MG/ML injection Generic drug: etanercept Inject 50 mg into the skin once a week.   escitalopram 10 MG tablet Commonly known as: LEXAPRO Take 1 tablet (10 mg total) by mouth daily.   ferrous sulfate 325 (65 FE) MG EC tablet Take 1  tablet (325 mg total) by mouth 2 (two) times daily. What changed: additional instructions   gabapentin 300 MG capsule Commonly known as: NEURONTIN Take 300 mg in the morning and 600 mg at night.   Gemtesa 75 MG Tabs Generic drug: Vibegron Take 75 mg by mouth daily.   ipratropium-albuterol 0.5-2.5 (3) MG/3ML Soln Commonly known as: DUONEB SMARTSIG:3 Milliliter(s) Via Nebulizer Every 6 Hours PRN   lamoTRIgine 100 MG tablet Commonly known as: LAMICTAL TAKE 1 TAB 2 TIMES DAILY. FURTHER REFILLS NEW PSYCH FOR ALL PSYCH MEDS ONLY TEMP SUPPLY FROM PCP   leflunomide 20 MG tablet Commonly known as: ARAVA Take 1 tablet (20 mg total) by mouth daily.   lidocaine 5 % Commonly known as: Lidoderm Place 1 patch onto the skin 2 (two) times daily as needed. Remove & Discard patch within 12 hours or as directed by MD What changed: when to take this   lovastatin 20 MG tablet Commonly known as: MEVACOR TAKE 1 TABLET BY MOUTH EVERYDAY AT BEDTIME *STOP TALKING '40MG'$ * What changed: See the new instructions.   methocarbamol  500 MG tablet Commonly known as: ROBAXIN Take 500 mg by mouth 4 (four) times daily as needed. Taking twice a day currently   mometasone 50 MCG/ACT nasal spray Commonly known as: Nasonex 1 spray to each nostril twice a day   montelukast 10 MG tablet Commonly known as: SINGULAIR TAKE 1 TABLET BY MOUTH EVERY DAY   multivitamin-lutein Caps capsule Take 1 capsule by mouth at bedtime.   nebivolol 5 MG tablet Commonly known as: Bystolic Take 1 tablet (5 mg total) by mouth in the morning and at bedtime. (Note dose changed from 1/2 10 mg bid to 5 mg bid)   OXYGEN Inhale 3 L into the lungs at bedtime.   pantoprazole 40 MG tablet Commonly known as: PROTONIX TAKE 1 TABLET BY MOUTH 2 TIMES DAILY 30 MIN BEFORE FOOD (NOTE REDUCTION IN FREQUENCY)   predniSONE 5 MG tablet Commonly known as: DELTASONE TAKE 1 TABLET BY MOUTH EVERY DAY WITH BREAKFAST   Prolia 60 MG/ML Sosy injection Generic drug: denosumab Inject 60 mg into the skin every 6 (six) months.   QUEtiapine 25 MG tablet Commonly known as: SEROQUEL TAKE 1 TABLET (25 MG TOTAL) BY MOUTH AT BEDTIME. AGAIN LAST FILL FURTHER REFILLS FROM PSYCHIATRY NO EXCEPTIONS   sucralfate 1 g tablet Commonly known as: CARAFATE TAKE 1 TABLET BY MOUTH 2 TIMES DAILY.   torsemide 20 MG tablet Commonly known as: DEMADEX Take 1 tablet (20 mg total) by mouth daily.   traMADol 50 MG tablet Commonly known as: ULTRAM Take 1 tablet (50 mg total) by mouth 2 (two) times daily.   Vitamin D3 50 MCG (2000 UT) Tabs Generic drug: Cholecalciferol Take 4,000 Units by mouth daily.        Follow-up Information     Carollee Leitz, MD. Schedule an appointment as soon as possible for a visit.   Specialty: Family Medicine Why: hospital f/u Contact information: [place] [phone]                 Filed Weights   04/06/22 0830 04/07/22 0821 04/08/22 0500  Weight: 75.6 kg 75.3 kg 75.5 kg     Condition at  discharge: fair  The results of significant diagnostics from this hospitalization (including imaging, microbiology, ancillary and laboratory) are listed below for reference.   Imaging Studies: ECHOCARDIOGRAM COMPLETE  Result Date: 03/30/2022    ECHOCARDIOGRAM REPORT   Patient  Name:   Kerri Carter Date of Exam: 03/30/2022 Medical Rec #:  [id number]     Height:       59.0 in Accession #:    [id number]    Weight:       175.3 lb Date of Birth:  [birth year]     BSA:          1.744 m Patient Age:    15 years      BP:           125/66 mmHg Patient Gender: F             HR:           83 bpm. Exam Location:  [facility] Procedure: 2D Echo, Cardiac Doppler and Color Doppler Indications:     Mitral valve disorder I05.9  History:         Patient has prior history of Echocardiogram examinations, most                  recent 07/30/2021. CHF; COPD.  Sonographer:     Sherrie Sport Referring Phys:  [id number] CARMEN L GONZALEZ Diagnosing Phys: Kate Sable MD  Sonographer Comments: Technically challenging study due to limited acoustic windows and no parasternal window. Image acquisition challenging due to COPD. IMPRESSIONS  1. Left ventricular ejection fraction, by estimation, is 55 to 60%. The left ventricle has normal function. The left ventricle has no regional wall motion abnormalities. Left ventricular diastolic parameters are consistent with Grade II diastolic dysfunction (pseudonormalization).  2. Right ventricular systolic function is normal. The right ventricular size is normal.  3. The mitral valve is degenerative. Mild to moderate mitral valve regurgitation.  4. The aortic valve is calcified. Aortic valve regurgitation is mild to moderate. Mild to moderate aortic valve stenosis. Aortic valve area, by VTI measures 1.19 cm. Aortic valve mean gradient measures 20.7 mmHg. Aortic valve Vmax measures 3.03 m/s. FINDINGS  Left Ventricle: Left ventricular ejection fraction, by estimation, is 55 to 60%. The left ventricle has normal  function. The left ventricle has no regional wall motion abnormalities. The left ventricular internal cavity size was normal in size. Suboptimal image quality limits for assessment of left ventricular hypertrophy. Left ventricular diastolic parameters are consistent with Grade II diastolic dysfunction (pseudonormalization). Right Ventricle: The right ventricular size is normal. Right vetricular wall thickness was not well visualized. Right ventricular systolic function is normal. Left Atrium: Left atrial size was normal in size. Right Atrium: Right atrial size was normal in size. Pericardium: There is no evidence of pericardial effusion. Mitral Valve: The mitral valve is degenerative in appearance. Mild to moderate mitral annular calcification. Mild to moderate mitral valve regurgitation. MV peak gradient, 6.5 mmHg. The mean mitral valve gradient is 3.0 mmHg. Tricuspid Valve: The tricuspid valve is grossly normal. Tricuspid valve regurgitation is not demonstrated. Aortic Valve: The aortic valve is calcified. Aortic valve regurgitation is mild to moderate. Mild to moderate aortic stenosis is present. Aortic valve mean gradient measures 20.7 mmHg. Aortic valve peak gradient measures 36.6 mmHg. Aortic valve area, by VTI measures 1.19 cm. Pulmonic Valve: The pulmonic valve was not well visualized. Pulmonic valve regurgitation is not visualized. Aorta: The aortic root was not well visualized. Venous: The inferior vena cava was not well visualized. IAS/Shunts: No atrial level shunt detected by color flow Doppler.  LEFT VENTRICLE PLAX 2D LVIDd:         4.70 cm   Diastology LVIDs:  3.40 cm   LV e' medial:    7.62 cm/s LV PW:         0.90 cm   LV E/e' medial:  18.5 LV IVS:        0.80 cm   LV e' lateral:   7.51 cm/s LVOT diam:     2.00 cm   LV E/e' lateral: 18.8 LV SV:         80 LV SV Index:   46 LVOT Area:     3.14 cm  RIGHT VENTRICLE RV Basal diam:  3.10 cm RV Mid diam:    2.90 cm RV S prime:     10.90 cm/s TAPSE  (M-mode): 2.1 cm LEFT ATRIUM           Index        RIGHT ATRIUM           Index LA diam:      3.30 cm 1.89 cm/m   RA Area:     11.40 cm LA Vol (A2C): 38.9 ml 22.31 ml/m  RA Volume:   23.30 ml  13.36 ml/m LA Vol (A4C): 86.8 ml 49.78 ml/m  AORTIC VALVE AV Area (Vmax):    1.18 cm AV Area (Vmean):   1.17 cm AV Area (VTI):     1.19 cm AV Vmax:           302.67 cm/s AV Vmean:          213.333 cm/s AV VTI:            0.674 m AV Peak Grad:      36.6 mmHg AV Mean Grad:      20.7 mmHg LVOT Vmax:         114.00 cm/s LVOT Vmean:        79.500 cm/s LVOT VTI:          0.255 m LVOT/AV VTI ratio: 0.38  AORTA Ao Root diam: 2.10 cm MITRAL VALVE                TRICUSPID VALVE MV Area (PHT): 2.75 cm     TR Peak grad:   27.2 mmHg MV Area VTI:   1.78 cm     TR Vmax:        261.00 cm/s MV Peak grad:  6.5 mmHg MV Mean grad:  3.0 mmHg     SHUNTS MV Vmax:       1.27 m/s     Systemic VTI:  0.26 m MV Vmean:      82.0 cm/s    Systemic Diam: 2.00 cm MV Decel Time: 276 msec MV E velocity: 141.00 cm/s MV A velocity: 121.00 cm/s MV E/A ratio:  1.17 Kate Sable MD Electronically signed by Kate Sable MD Signature Date/Time: 03/30/2022/4:11:48 PM    Final    DG Chest Port 1 View  Result Date: 03/30/2022 CLINICAL DATA:  Shortness of breath EXAM: PORTABLE CHEST 1 VIEW COMPARISON:  03/28/2022 FINDINGS: Worsening opacity on both sides of the chest could be due to developing edema, worsening pneumonia or accumulating pleural fluid. Focal infiltrate in the right upper lung and to a lesser extent in the left lower lobe remains visible. IMPRESSION: Worsening opacity on both sides of the chest that could be due to developing edema, worsening pneumonia or accumulating pleural fluid. Electronically Signed   By: Nelson Chimes M.D.   On: 03/30/2022 09:04   DG Chest Portable 1 View  Result Date: 03/28/2022 CLINICAL DATA:  Shortness of breath  EXAM: PORTABLE CHEST 1 VIEW COMPARISON:  Radiograph 09/09/2021 FINDINGS: Patchy bilateral  airspace opacities greatest in the right upper lobe and left lower lung. Biapical pleural-parenchymal scarring. Stable cardiomediastinal silhouette. No definite pleural effusion or pneumothorax. No acute osseous abnormality. Postoperative changes left clavicle and both shoulders. Remote bilateral rib fractures. IMPRESSION: Patchy bilateral airspace opacities concerning for multifocal pneumonia. Follow-up after treatment is recommended to ensure resolution. Electronically Signed   By: Placido Sou M.D.   On: 03/28/2022 01:33    Microbiology: Results for orders placed or performed during the hospital encounter of 03/28/22  Resp panel by RT-PCR (RSV, Flu A&B, Covid) Anterior Nasal Swab     Status: None   Collection Time: 03/28/22  1:03 AM   Specimen: Anterior Nasal Swab  Result Value Ref Range Status   SARS Coronavirus 2 by RT PCR NEGATIVE NEGATIVE Final    Comment: (NOTE) SARS-CoV-2 target nucleic acids are NOT DETECTED.  The SARS-CoV-2 RNA is generally detectable in upper respiratory specimens during the acute phase of infection. The lowest concentration of SARS-CoV-2 viral copies this assay can detect is 138 copies/mL. A negative result does not preclude SARS-Cov-2 infection and should not be used as the sole basis for treatment or other patient management decisions. A negative result may occur with  improper specimen collection/handling, submission of specimen other than nasopharyngeal swab, presence of viral mutation(s) within the areas targeted by this assay, and inadequate number of viral copies(<138 copies/mL). A negative result must be combined with clinical observations, patient history, and epidemiological information. The expected result is Negative.  Fact Sheet for Patients:  EntrepreneurPulse.com.au  Fact Sheet for Healthcare Providers:  IncredibleEmployment.be  This test is no t yet approved or cleared by the [place] FDA and  has  been authorized for detection and/or diagnosis of SARS-CoV-2 by FDA under an Emergency Use Authorization (EUA). This EUA will remain  in effect (meaning this test can be used) for the duration of the COVID-19 declaration under Section 564(b)(1) of the Act, 21 U.S.C.section 360bbb-3(b)(1), unless the authorization is terminated  or revoked sooner.       Influenza A by PCR NEGATIVE NEGATIVE Final   Influenza B by PCR NEGATIVE NEGATIVE Final    Comment: (NOTE) The Xpert Xpress SARS-CoV-2/FLU/RSV plus assay is intended as an aid in the diagnosis of influenza from Nasopharyngeal swab specimens and should not be used as a sole basis for treatment. Nasal washings and aspirates are unacceptable for Xpert Xpress SARS-CoV-2/FLU/RSV testing.  Fact Sheet for Patients: EntrepreneurPulse.com.au  Fact Sheet for Healthcare Providers: IncredibleEmployment.be  This test is not yet approved or cleared by the [place] FDA and has been authorized for detection and/or diagnosis of SARS-CoV-2 by FDA under an Emergency Use Authorization (EUA). This EUA will remain in effect (meaning this test can be used) for the duration of the COVID-19 declaration under Section 564(b)(1) of the Act, 21 U.S.C. section 360bbb-3(b)(1), unless the authorization is terminated or revoked.     Resp Syncytial Virus by PCR NEGATIVE NEGATIVE Final    Comment: (NOTE) Fact Sheet for Patients: EntrepreneurPulse.com.au  Fact Sheet for Healthcare Providers: IncredibleEmployment.be  This test is not yet approved or cleared by the [place] FDA and has been authorized for detection and/or diagnosis of SARS-CoV-2 by FDA under an Emergency Use Authorization (EUA). This EUA will remain in effect (meaning this test can be used) for the duration of the COVID-19 declaration under Section 564(b)(1) of the Act, 21 U.S.C. section 360bbb-3(b)(1), unless the  authorization is terminated or revoked.  Performed at [facility], [address] [place], [place] [address]   Culture, blood (routine x 2)     Status: None   Collection Time: 03/28/22  1:03 AM   Specimen: BLOOD  Result Value Ref Range Status   Specimen Description BLOOD BLOOD RIGHT FOREARM  Final   Special Requests   Final    BOTTLES DRAWN AEROBIC AND ANAEROBIC Blood Culture adequate volume   Culture   Final    NO GROWTH 5 DAYS Performed at [facility], [address], [place], [place] [address]    Report Status 04/02/2022 FINAL  Final  Culture, blood (routine x 2)     Status: None   Collection Time: 03/28/22  1:05 AM   Specimen: BLOOD  Result Value Ref Range Status   Specimen Description BLOOD RIGHT ANTECUBITAL  Final   Special Requests   Final    BOTTLES DRAWN AEROBIC AND ANAEROBIC Blood Culture adequate volume   Culture   Final    NO GROWTH 5 DAYS Performed at [facility], [address] [place], [place] [address]    Report Status 04/02/2022 FINAL  Final  MRSA Next Gen by PCR, Nasal     Status: None   Collection Time: 03/30/22  9:50 AM   Specimen: Nasal Mucosa; Nasal Swab  Result Value Ref Range Status   MRSA by PCR Next Gen NOT DETECTED NOT DETECTED Final    Comment: Performed at [facility], [address] [place],  [address]   *Note: Due to a large number of results and/or encounters for the requested time period, some results have not been displayed. A complete set of results can be found in Results Review.    Labs: CBC: Recent Labs  Lab 04/03/22 0527 04/08/22 0259  WBC 14.4* 13.7*  NEUTROABS 10.1*  --   HGB 10.1* 10.9*  HCT 32.4* 34.3*  MCV 89.8 88.2  PLT 339 [id number]   Basic Metabolic Panel: Recent Labs  Lab 04/02/22 0428 04/03/22 0527 04/04/22 0405 04/05/22 0553 04/07/22 1508  NA 141 140 136 138 138  K 4.1 3.7 3.9 4.9 4.6  CL 101 100 96* 97* 97*  CO2 '30 30 30 '$ 32 30  GLUCOSE 197* 138* 170* 146*  142*  BUN 39* 30* 31* 35* 32*  CREATININE 1.21* 1.20* 1.20* 1.23* 1.36*  CALCIUM 7.3* 7.3* 8.0* 8.6* 8.7*  MG 2.7* 2.3 2.5* 2.9* 2.3  PHOS  --   --   --   --  4.2   CBG: Recent Labs  Lab 04/07/22 1145 04/07/22 1304 04/07/22 1606 04/07/22 2150 04/08/22 0854  GLUCAP 191* 161* 154* 194* 122*    Discharge time spent: greater than 30 minutes.  Signed: Fritzi Mandes, MD [facility] 04/08/2022

## 2022-04-08 NOTE — Discharge Instructions (Addendum)
Use your oxygen, nebulizer, inhalers as before

## 2022-04-09 ENCOUNTER — Encounter: Payer: Self-pay | Admitting: Pulmonary Disease

## 2022-04-15 ENCOUNTER — Encounter: Payer: Self-pay | Admitting: Pulmonary Disease

## 2022-04-21 ENCOUNTER — Telehealth: Payer: Self-pay

## 2022-04-21 NOTE — Transitions of Care (Post Inpatient/ED Visit) (Signed)
   04/21/2022  Name: Kerri Carter MRN: [id number] DOB: December 23, 1940  Today's TOC FU Call Status: Today's TOC FU Call Status:: Successful TOC FU Call Competed TOC FU Call Complete Date: 04/21/22  Transition Care Management Follow-up Telephone Call Date of Discharge: 04/20/22 Discharge Facility: Other ([place]) Name of Other (Non[facility]) Discharge Facility: [facility] Type of Discharge: Inpatient Admission Primary Inpatient Discharge Diagnosis:: respiratory failure How have you been since you were released from the hospital?: Better Any questions or concerns?: No  Items Reviewed: Did you receive and understand the discharge instructions provided?: Yes Medications obtained and verified?: Yes (Medications Reviewed) Any new allergies since your discharge?: No Dietary orders reviewed?: Yes Do you have support at home?: Yes People in Home: child(ren), adult, grandchild(ren)  Home Care and Equipment/Supplies: [place] Ordered?: NA Any new equipment or medical supplies ordered?: NA  Functional Questionnaire: Do you need assistance with bathing/showering or dressing?: No Do you need assistance with meal preparation?: No Do you need assistance with eating?: No Do you have difficulty maintaining continence: No Do you need assistance with getting out of bed/getting out of a chair/moving?: No Do you have difficulty managing or taking your medications?: No  Follow up appointments reviewed: PCP Follow-up appointment confirmed?: No (no avail appt times, sent message to staff to schedule) MD Provider Line [phone] Given: No [facility] Follow-up appointment confirmed?: NA Do you need transportation to your follow-up appointment?: No Do you understand care options if your condition(s) worsen?: Yes-patient verbalized understanding    [place], [place] Nurse Health Advisor Direct Dial [phone]

## 2022-04-22 ENCOUNTER — Encounter: Payer: Self-pay | Admitting: Family

## 2022-04-22 ENCOUNTER — Other Ambulatory Visit: Payer: Self-pay

## 2022-04-22 ENCOUNTER — Ambulatory Visit: Payer: Medicare PPO | Attending: Family | Admitting: Family

## 2022-04-22 VITALS — BP 113/60 | HR 90 | Wt 174.4 lb

## 2022-04-22 DIAGNOSIS — D631 Anemia in chronic kidney disease: Secondary | ICD-10-CM | POA: Insufficient documentation

## 2022-04-22 DIAGNOSIS — Z87891 Personal history of nicotine dependence: Secondary | ICD-10-CM | POA: Diagnosis not present

## 2022-04-22 DIAGNOSIS — Z981 Arthrodesis status: Secondary | ICD-10-CM

## 2022-04-22 DIAGNOSIS — I340 Nonrheumatic mitral (valve) insufficiency: Secondary | ICD-10-CM | POA: Diagnosis not present

## 2022-04-22 DIAGNOSIS — K219 Gastro-esophageal reflux disease without esophagitis: Secondary | ICD-10-CM | POA: Insufficient documentation

## 2022-04-22 DIAGNOSIS — I10 Essential (primary) hypertension: Secondary | ICD-10-CM | POA: Diagnosis not present

## 2022-04-22 DIAGNOSIS — I739 Peripheral vascular disease, unspecified: Secondary | ICD-10-CM | POA: Diagnosis not present

## 2022-04-22 DIAGNOSIS — Z79899 Other long term (current) drug therapy: Secondary | ICD-10-CM | POA: Insufficient documentation

## 2022-04-22 DIAGNOSIS — I2510 Atherosclerotic heart disease of native coronary artery without angina pectoris: Secondary | ICD-10-CM | POA: Insufficient documentation

## 2022-04-22 DIAGNOSIS — J4489 Other specified chronic obstructive pulmonary disease: Secondary | ICD-10-CM | POA: Diagnosis not present

## 2022-04-22 DIAGNOSIS — E785 Hyperlipidemia, unspecified: Secondary | ICD-10-CM | POA: Diagnosis not present

## 2022-04-22 DIAGNOSIS — I080 Rheumatic disorders of both mitral and aortic valves: Secondary | ICD-10-CM | POA: Diagnosis not present

## 2022-04-22 DIAGNOSIS — J449 Chronic obstructive pulmonary disease, unspecified: Secondary | ICD-10-CM

## 2022-04-22 DIAGNOSIS — D500 Iron deficiency anemia secondary to blood loss (chronic): Secondary | ICD-10-CM

## 2022-04-22 DIAGNOSIS — R0602 Shortness of breath: Secondary | ICD-10-CM | POA: Diagnosis not present

## 2022-04-22 DIAGNOSIS — I5032 Chronic diastolic (congestive) heart failure: Secondary | ICD-10-CM

## 2022-04-22 DIAGNOSIS — Z8249 Family history of ischemic heart disease and other diseases of the circulatory system: Secondary | ICD-10-CM | POA: Insufficient documentation

## 2022-04-22 DIAGNOSIS — R5383 Other fatigue: Secondary | ICD-10-CM | POA: Insufficient documentation

## 2022-04-22 DIAGNOSIS — N1831 Chronic kidney disease, stage 3a: Secondary | ICD-10-CM | POA: Insufficient documentation

## 2022-04-22 DIAGNOSIS — F419 Anxiety disorder, unspecified: Secondary | ICD-10-CM | POA: Diagnosis not present

## 2022-04-22 DIAGNOSIS — H3530 Unspecified macular degeneration: Secondary | ICD-10-CM | POA: Insufficient documentation

## 2022-04-22 DIAGNOSIS — J849 Interstitial pulmonary disease, unspecified: Secondary | ICD-10-CM | POA: Insufficient documentation

## 2022-04-22 DIAGNOSIS — Z7952 Long term (current) use of systemic steroids: Secondary | ICD-10-CM | POA: Diagnosis not present

## 2022-04-22 DIAGNOSIS — I130 Hypertensive heart and chronic kidney disease with heart failure and stage 1 through stage 4 chronic kidney disease, or unspecified chronic kidney disease: Secondary | ICD-10-CM | POA: Insufficient documentation

## 2022-04-22 NOTE — Progress Notes (Signed)
Patient ID: Kerri Carter, female    DOB: [birth year], 82 y.o.   MRN: [id number]  HPI  Kerri Carter is a 82 y/o female with a history of asthma, PAF, carotid disease, CAD, hyperlipidemia, HTN, CKD, anxiety, aortic stenosis, bipolar, conversion disorder, depression, GERD, ILD, macular degeneration, PAD, previous tobacco use and chronic heart failure.   Echo 03/30/22: EF 55-60% with mild/ moderate MR/ AR and mild/ moderate AS. Echo 07/29/21: EF of 55-60% along with moderate/severe MR but no LVH.   Admitted 03/28/22 due to SOB due to pneumonia and COPD. Antibiotics provided.   She presents today for a HF follow-up visit with a chief complaint of moderate SOB w/ minimal exertion. Chronic in nature. Has associated fatigue, cough, minimal pedal edema, dizziness (unbalance) and easy bruising along with this. Denies difficulty sleeping, abdominal distention, palpitations, chest pain or wheezing.   Spent 12 days in rehab after her recent admission. Hasn't been weighing herself at home but does have scales. Wearing oxygen @ 3L although she arrived with her portable tank empty. Placed on our oxygen tank while in the office.   Past Medical History:  Diagnosis Date   Acromial process of scapula fracture 12/21/2019   Acute kidney injury superimposed on chronic kidney disease ([place]) 05/26/2021   Acute on chronic combined systolic (congestive) and diastolic (congestive) heart failure ([place]) 07/28/2021   Acute on chronic combined systolic and diastolic CHF (congestive heart failure) ([place]) 05/26/2021   Acute on chronic respiratory failure with hypoxia ([place]) 09/10/2021   Acute pain of left knee 12/18/2020   Anemia in stage 3a chronic kidney disease ([place]) 10/09/2021   Anxiety    Anxiety 01/20/2019   Anxiety and depression 05/26/2021   Aortic stenosis 07/09/2015   a.) TTE 07/06/2015: EF 55-60%; mild AS with MPG 13.0 mmHg.   Arrhythmia    atrial fibrillation   Assistance needed for mobility 12/18/2020   Asthma     At high risk for falls 03/16/2019   Avascular necrosis of left shoulder due to adverse effect of steroid therapy ([place]) 01/20/2018   Bipolar disorder ([place])    C. difficile diarrhea 2010   a.) following ABX course during hospital admission   Carotid atherosclerosis, bilateral    a.) Moderate; < 50% stenosis BILATERAL ICAs.   Cataract    a.) s/p BILATERAL extraction   CHF (congestive heart failure) (HCC)    Chicken pox    Chronic left shoulder pain 08/29/2019   Chronic respiratory failure with hypoxia ([place]) 10/01/2021   CKD (chronic kidney disease), stage III (HCC)    a. s/p R nephrectomy./ aneurysm   Closed fracture of acromial process of scapula 10/28/2018   Closed nondisplaced fracture of proximal phalanx of lesser toe of right foot    Collapsed vertebra, not elsewhere classified, lumbar region, initial encounter for fracture ([place]) 02/21/2019   Community acquired pneumonia [id number]   Complicated grief [id number]   Compression fracture of L4 vertebra ([place]) 02/21/2019   Conversion disorder    Conversion disorder 08/04/2015   COPD (chronic obstructive pulmonary disease) ([place])    COPD with exacerbation ([place]) 08/20/2021   Cough 05/02/2012   Depression    Dislocation of hip joint prosthesis ([place]) 01/08/2017   Elevated brain natriuretic peptide (BNP) level 12/18/2020   Elevated d-dimer 05/26/2021   Episodic mood disorder ([place]) 04/18/2019   Essential hypertension    Fatigue 07/07/2018   Frequent falls 12/18/2020   GERD (gastroesophageal reflux disease)    H/O cardiac catheterization 02/24/2011   Formatting  of this note might be different from the original.  Repeated at [facility] 2/13 and insignificant disease   Hav (hallux abducto valgus), unspecified laterality 12/05/2018   Heart murmur    Hip fracture ([place]) 10/31/2016   Hip pain 12/18/2020   History of fracture of left hip 01/15/2017   History of syncope 08/25/2016   Hyperkalemia 05/26/2021   - The patient was given p.o.  Lokelma and IV Lasix.  - We will follow potassium level.   Hyperlipidemia    Hypokalemia 08/25/2016   ILD (interstitial lung disease) (HCC)    mild; 2/2 RA diagnosis   Iliopsoas bursitis of right hip 08/29/2019   Inflammatory arthritis    a. hands/carpal tunnel.  b. Low titer rheumatoid factor. c. Negative anti-CCP antibodies. d. Plaquenil.   Insomnia 07/17/2015   Left foot pain 10/19/2016   Left lower lobe pneumonia 05/26/2021   Leg edema 05/02/2019   Leg pain, bilateral    Leg swelling 12/15/2016   Leukocytosis 10/19/2017   Leukopenia 06/24/2021   Lymphocytosis 07/13/2021   Macular degeneration    Microcytic anemia 08/15/2015   Mixed bipolar I disorder ([place]) 10/09/2015   Nocturnal hypoxemia    Nocturnal hypoxemia 09/09/2011   June 2013 overnight oximetry on 6 centimeters of water CPAP: SpO2 less than 88% 72% of the time   Non-Obstructive CAD    a. 07/2009 Cath ([facility]): nonobs dzs;  b. 03/2011 Cath [facility]): nonobs dzs.   NSIP (nonspecific interstitial pneumonitis) ([place]) 12/08/2019   Osteoarthritis    a. Knees.   Other shoulder lesions, left shoulder 12/20/2017   Other specified postprocedural states 07/20/2013   Overweight (BMI 25.0-29.9) 08/29/2019   PAD (peripheral artery disease) (HCC)    Pain due to onychomycosis of toenails of both feet 12/05/2018   Pelvic mass in female 03/16/2019   02/21/19 CT pelvis w/o contrast  Cystic left adnexal lesion has enlarged since 2018 where it  measured approximately 1.8 x 1.7 cm. This is not well assessed due  to streak artifact as well as lack of contrast. Ultrasound follow-up  may be helpful in 8-12 weeks      04/05/19 TVUS     IMPRESSION:  Simple appearing cystic structure within the left ovary measuring up  to 2.9 cm. While this has enlarged s   Physical deconditioning 02/27/2021   Pleural effusion on left 12/18/2020   PUD (peptic ulcer disease)    Recurrent falls 09/01/2021   Recurrent pneumonia 07/23/2021   Repeated falls 12/18/2020    Respiratory failure with hypoxia ([place]) 01/12/2021   Rotator cuff tendinitis, right 07/26/2017   S/P lumbar fusion 05/23/2021   S/P right hip fracture    11/01/16 s/p repair   Sepsis due to pneumonia ([place]) 07/22/2021   Shoulder pain    Sleep apnea    no cpap / minimal   SOB (shortness of breath) 10/13/2011   2011 [facility] Cardiology and Pulmonary eval >> LHC (non-obstructive CAD), RHC (wedge 13, mean PA 26), TTE (LVEF > 55%, mild concentric LVH, mild AR, MR, PR, TR), CTAngio chest unrevealing; "well controlled asthma"  02/2011 TTE [facility]>> NL LVEF, Mild LVH, Mod AR/Mod MR   Spinal stenosis at L4-L5 level    severe with L4/L5 anterolisthesis grade 1 anterolisthesis    Spinal stenosis of lumbar region 09/15/2016   Last Assessment & Plan:   Now post op with continued back and leg pain noted. Is working with pt/ot and surgery was said to have no complications. Bowels are moving and general  pain and anxiety is basically baseline.    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   Weakness of both lower extremities 12/18/2020   Past Surgical History:  Procedure Laterality Date   APPENDECTOMY     APPLICATION OF INTRAOPERATIVE CT SCAN N/A 05/23/2021   Procedure: APPLICATION OF INTRAOPERATIVE CT SCAN;  Surgeon: Meade Maw, MD;  Location: [facility] ORS;  Service: Neurosurgery;  Laterality: N/A;   BACK SURGERY     lumbar fusion   BUNIONECTOMY Right    CATARACT EXTRACTION, BILATERAL      CESAREAN SECTION     x1   CHOLECYSTECTOMY N/A 05/11/2016   Procedure: LAPAROSCOPIC CHOLECYSTECTOMY;  Surgeon: Florene Glen, MD;  Location: [facility] ORS;  Service: General;  Laterality: N/A;   COLONOSCOPY WITH PROPOFOL N/A 04/02/2016   Procedure: COLONOSCOPY WITH PROPOFOL;  Surgeon: Jonathon Bellows, MD;  Location: [facility] ENDOSCOPY;  Service: Endoscopy;  Laterality: N/A;   ENDOSCOPIC RETROGRADE CHOLANGIOPANCREATOGRAPHY (ERCP) WITH PROPOFOL N/A 05/08/2016   Procedure: ENDOSCOPIC RETROGRADE CHOLANGIOPANCREATOGRAPHY (ERCP) WITH PROPOFOL;  Surgeon: Lucilla Lame, MD;  Location: [facility] ENDOSCOPY;  Service: Endoscopy;  Laterality: N/A;   ERCP     with biliary spincterotomy 05/08/16 Dr. Allen Norris for choledocholithiasis    ESOPHAGEAL DILATION  04/02/2016   Procedure: ESOPHAGEAL DILATION;  Surgeon: Jonathon Bellows, MD;  Location: [facility] ENDOSCOPY;  Service: Endoscopy;;   ESOPHAGOGASTRODUODENOSCOPY (EGD) WITH PROPOFOL N/A 04/02/2016   Procedure: ESOPHAGOGASTRODUODENOSCOPY (EGD) WITH PROPOFOL;  Surgeon: Jonathon Bellows, MD;  Location: [facility] END[facility];  Service: Endoscopy;  Laterality: N/A;   HIP ARTHROPLASTY Right 11/01/2016   Procedure: ARTHROPLASTY BIPOLAR HIP (HEMIARTHROPLASTY);  Surgeon: Corky Mull, MD;  Location: [facility] ORS;  Service: Orthopedics;  Laterality: Right;   LUMBAR LAMINECTOMY/DECOMPRESSION MICRODISCECTOMY N/A 12/11/2020   Procedure: LEFT L2-3 MICRODISCECTOMY, L3-4 AND L5-S1 DECOMPRESSION;  Surgeon: Meade Maw, MD;  Location: [facility];  Service: Neurosurgery;  Laterality: N/A;   NEPHRECTOMY  1988   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   ORIF SCAPHOID FRACTURE Left 12/21/2019   Procedure: OPEN REDUCTION INTERNAL FIXATION (ORIF) OF LEFT SCAPULAR NONUNION WITH BONE GRAFT;  Surgeon: Corky Mull, MD;  Location: [facility] ORS;  Service: Orthopedics;  Laterality: Left;   osteoporosis     noted DEXA 08/19/16    REPLACEMENT TOTAL KNEE Right    REVERSE SHOULDER ARTHROPLASTY Right 11/04/2017   Procedure:  REVERSE SHOULDER ARTHROPLASTY;  Surgeon: Corky Mull, MD;  Location: [facility];  Service: Orthopedics;  Laterality: Right;   REVERSE SHOULDER ARTHROPLASTY Left 07/26/2018   Procedure: REVERSE SHOULDER ARTHROPLASTY;  Surgeon: Corky Mull, MD;  Location: [facility];  Service: Orthopedics;  Laterality: Left;   TONSILLECTOMY     TOTAL HIP ARTHROPLASTY  12/10/11   [facility] left hip   TOTAL HIP ARTHROPLASTY Bilateral    TRANSFORAMINAL LUMBAR INTERBODY FUSION (TLIF) WITH PEDICLE SCREW FIXATION 3 LEVEL N/A 05/23/2021   Procedure: OPEN L2-4 TRANSFORAMINAL LUMBAR INTERBODY FUSION (TLIF) WITH L2-5 POSTERIOR SPINAL FUSION;  Surgeon: Meade Maw, MD;  Location: [facility];  Service: Neurosurgery;  Laterality: N/A;   TUBAL LIGATION     Family History  Problem Relation Age of Onset   Rheum arthritis Mother    Asthma Mother    Parkinson's disease Mother    Heart disease Mother    Stroke Mother    Hypertension Mother    Heart attack Father    Heart disease Father    Hypertension Father    Peripheral Artery Disease Father    Diabetes Son    Gout Son    Asthma Sister    Heart disease Sister    Lung cancer Sister    Heart disease Sister    Heart disease Sister    Breast cancer Sister    Heart attack Sister    Heart disease Brother    Heart disease Maternal Grandmother    Diabetes Maternal Grandmother    Colon cancer Maternal Grandmother    Cancer Maternal Grandmother        Hodgkins lymphoma   Heart disease Brother    Alcohol abuse Brother    Depression Brother    Dementia Son    Social History   Tobacco Use   Smoking status: Former    Packs/day: 0.50    Years: 20.00    Additional pack years: 0.00    Total pack years: 10.00    Types: Cigarettes    Quit date: 02/02/1974    Years since quitting: 48.2   Smokeless tobacco: Never  Substance Use Topics   Alcohol use: No   Allergies  Allergen Reactions   Ceftin [Cefuroxime Axetil] Anaphylaxis   Lisinopril Anaphylaxis   Sulfa  Antibiotics Other (See Comments)    Face swelling   Sulfasalazine Anaphylaxis   Morphine Other (See Comments)    Per patient, low blood pressure issues that requires action to raise it back up. Can take small infrequent doses   Xarelto [Rivaroxaban] Other (See Comments)    Stomach burning, bleeding, and tar in stool   Adhesive [Tape] Rash    Paper tape and tega derm OK   Antihistamines, Chlorpheniramine-Type Other (See Comments)    Makes pt hyper   Antivert [Meclizine Hcl] Other (See Comments)    Bladder will not empty   Aspirin Other (See Comments)    Sulfasalazine allergy cross reacts   Contrast Media [Iodinated Contrast Media] Rash    she is able to use betadine scrubs.   Decongestant [Pseudoephedrine Hcl] Other (See Comments)    Makes pt hyper   Doxycycline Other (See Comments)    GI upset   Levaquin [Levofloxacin In D5w] Rash   Polymyxin B Other (See Comments)    Facial rash   Tetanus Toxoids Rash and Other (See Comments)    Fever and hot to touch at injection site   Prior to Admission medications   Medication Sig Start Date End Date Taking? Authorizing Provider  albuterol (PROVENTIL) (2.5 MG/3ML) 0.083% nebulizer solution Take by nebulization. 12/08/21  Yes [provider]  albuterol (VENTOLIN HFA) 108 (90 Base) MCG/ACT inhaler Inhale 2 puffs into the lungs every 6 (six) hours as needed for wheezing or shortness of breath. 06/24/21  Yes McLean-Scocuzza, Nino Glow, MD  amLODipine (NORVASC) 5 MG tablet Take 1 tablet (5 mg total) by mouth daily. 08/01/21  Yes McLean-Scocuzza, Nino Glow, MD  apixaban (ELIQUIS) 5 MG TABS tablet Take 1 tablet (5 mg total) by mouth 2 (two) times daily. 10/15/21  Yes McLean-Scocuzza, Nino Glow, MD  budesonide (PULMICORT) 0.5 MG/2ML  nebulizer solution Take 2 mLs (0.5 mg total) by nebulization 2 (two) times daily. 01/01/22 01/01/23 Yes [name], MD  busPIRone (BUSPAR) 5 MG tablet Take 1 tablet (5 mg total) by mouth 2 (two) times daily. 06/02/21   Yes Jennye Boroughs, MD  cetirizine (ZYRTEC ALLERGY) 10 MG tablet Take 1 tablet (10 mg total) by mouth at bedtime. 02/26/22  Yes [name], MD  Cholecalciferol (VITAMIN D3) 50 MCG (2000 UT) TABS Take 4,000 Units by mouth daily. 08/01/21  Yes McLean-Scocuzza, Nino Glow, MD  dicyclomine (BENTYL) 10 MG capsule TAKE 1 CAPSULE (10 MG TOTAL) BY MOUTH 2 (TWO) TIMES DAILY AS NEEDED FOR SPASMS. 11/03/21 10/29/22 Yes Jonathon Bellows, MD  ENBREL SURECLICK 50 MG/ML injection Inject 50 mg into the skin once a week.   Yes [provider]  escitalopram (LEXAPRO) 10 MG tablet Take 1 tablet (10 mg total) by mouth daily. 08/01/21  Yes McLean-Scocuzza, Nino Glow, MD  ferrous sulfate 325 (65 FE) MG EC tablet Take 1 tablet (325 mg total) by mouth 2 (two) times daily. Patient taking differently: Take 325 mg by mouth 2 (two) times daily. Pt reports MD instructed her to take twice a day every other day. 09/12/21 09/12/22 Yes Danford, Suann Larry, MD  gabapentin (NEURONTIN) 300 MG capsule Take 300 mg in the morning and 600 mg at night. 01/06/22  Yes Carollee Leitz, MD  ipratropium-albuterol (DUONEB) 0.5-2.5 (3) MG/3ML SOLN SMARTSIG:3 Milliliter(s) Via Nebulizer Every 6 Hours PRN 03/25/22  Yes [provider]  lamoTRIgine (LAMICTAL) 100 MG tablet TAKE 1 TAB 2 TIMES DAILY. FURTHER REFILLS NEW PSYCH FOR ALL PSYCH MEDS ONLY TEMP SUPPLY FROM PCP 05/02/21  Yes McLean-Scocuzza, Nino Glow, MD  leflunomide (ARAVA) 20 MG tablet Take 1 tablet (20 mg total) by mouth daily. 09/30/17  Yes McLean-Scocuzza, Nino Glow, MD  lidocaine (LIDODERM) 5 % Place 1 patch onto the skin 2 (two) times daily as needed. Remove & Discard patch within 12 hours or as directed by MD Patient taking differently: Place 1 patch onto the skin daily. Remove & Discard patch within 12 hours or as directed by MD 11/22/20  Yes McLean-Scocuzza, Nino Glow, MD  lovastatin (MEVACOR) 20 MG tablet TAKE 1 TABLET BY MOUTH EVERYDAY AT BEDTIME *STOP TALKING 40MG * Patient taking  differently: Take 20 mg by mouth at bedtime. 08/01/21  Yes McLean-Scocuzza, Nino Glow, MD  methocarbamol (ROBAXIN) 500 MG tablet Take 500 mg by mouth 4 (four) times daily as needed. Taking twice a day currently 01/23/21  Yes [provider]  mometasone (NASONEX) 50 MCG/ACT nasal spray 1 spray to each nostril twice a day 02/26/22  Yes [name], MD  montelukast (SINGULAIR) 10 MG tablet TAKE 1 TABLET BY MOUTH EVERY DAY Patient taking differently: Take 10 mg by mouth daily. 03/24/21  Yes McLean-Scocuzza, Nino Glow, MD  multivitamin-lutein [facility]) CAPS capsule Take 1 capsule by mouth at bedtime.   Yes [provider]  nebivolol (BYSTOLIC) 5 MG tablet Take 1 tablet (5 mg total) by mouth in the morning and at bedtime. (Note dose changed from 1/2 10 mg bid to 5 mg bid) 08/01/21  Yes McLean-Scocuzza, Nino Glow, MD  OXYGEN Inhale 3 L into the lungs at bedtime.   Yes [provider]  pantoprazole (PROTONIX) 40 MG tablet TAKE 1 TABLET BY MOUTH 2 TIMES DAILY 30 MIN BEFORE FOOD (NOTE REDUCTION IN FREQUENCY) 11/03/21  Yes McLean-Scocuzza, Nino Glow, MD  predniSONE (DELTASONE) 5 MG tablet TAKE 1 TABLET BY MOUTH EVERY  DAY WITH BREAKFAST 02/20/22  Yes [name], MD  PROLIA 60 MG/ML SOSY injection Inject 60 mg into the skin every 6 (six) months. 03/09/22  Yes [provider]  QUEtiapine (SEROQUEL) 25 MG tablet TAKE 1 TABLET (25 MG TOTAL) BY MOUTH AT BEDTIME. AGAIN LAST FILL FURTHER REFILLS FROM PSYCHIATRY NO EXCEPTIONS 09/13/20  Yes McLean-Scocuzza, Nino Glow, MD  sucralfate (CARAFATE) 1 g tablet TAKE 1 TABLET BY MOUTH 2 TIMES DAILY. 11/12/21  Yes Jonathon Bellows, MD  torsemide (DEMADEX) 20 MG tablet Take 1 tablet (20 mg total) by mouth daily. 09/12/21  Yes Danford, Suann Larry, MD  traMADol (ULTRAM) 50 MG tablet Take 1 tablet (50 mg total) by mouth 2 (two) times daily. 04/08/22  Yes Fritzi Mandes, MD  Vibegron (GEMTESA) 75 MG TABS Take 75 mg by mouth daily. 01/08/22  Yes  Vaillancourt, Aldona Bar, PA-C   Review of Systems  Constitutional:  Positive for fatigue (easily). Negative for appetite change.  HENT:  Negative for congestion, postnasal drip and sore throat.   Eyes: Negative.   Respiratory:  Positive for cough (productive at times) and shortness of breath (easily). Negative for chest tightness and wheezing.   Cardiovascular:  Positive for leg swelling. Negative for chest pain and palpitations.  Gastrointestinal:  Negative for abdominal distention and abdominal pain.  Endocrine: Negative.   Genitourinary: Negative.   Musculoskeletal:  Negative for arthralgias, back pain and neck pain.  Skin: Negative.   Allergic/Immunologic: Negative.   Neurological:  Positive for dizziness (unsteady). Negative for light-headedness.  Hematological:  Negative for adenopathy. Bruises/bleeds easily.  Psychiatric/Behavioral:  Negative for dysphoric mood and sleep disturbance (sleeping on 1 pillow with HOB elevated along with oxygen). The patient is not nervous/anxious.    Vitals:   04/22/22 1318  BP: 113/60  Pulse: 90  SpO2: 99%  Weight: 174 lb 6 oz (79.1 kg)   Wt Readings from Last 3 Encounters:  04/22/22 174 lb 6 oz (79.1 kg)  04/08/22 166 lb 7.2 oz (75.5 kg)  03/19/22 175 lb (79.4 kg)   Lab Results  Component Value Date   CREATININE 1.36 (H) 04/07/2022   CREATININE 1.23 (H) 04/05/2022   CREATININE 1.20 (H) 04/04/2022   Physical Exam Vitals and nursing note reviewed.  Constitutional:      Appearance: She is well-developed.  HENT:     Head: Normocephalic and atraumatic.  Neck:     Vascular: No JVD.  Cardiovascular:     Rate and Rhythm: Normal rate and regular rhythm.  Pulmonary:     Effort: Pulmonary effort is normal. No accessory muscle usage.     Breath sounds: No wheezing, rhonchi or rales.  Abdominal:     Palpations: Abdomen is soft.     Tenderness: There is no abdominal tenderness.  Musculoskeletal:     Cervical back: Normal range of motion and  neck supple.     Right lower leg: No tenderness. Edema (trace pitting) present.     Left lower leg: No tenderness. Edema (trace pitting) present.  Skin:    General: Skin is warm and dry.  Neurological:     General: No focal deficit present.     Mental Status: She is alert and oriented to person, place, and time.  Psychiatric:        Mood and Affect: Mood normal.        Behavior: Behavior normal.    Assessment & Plan:  1: Chronic heart failure with preserved ejection fraction - - NYHA class III -  euvolemic today - not weighing daily but does have scales at home; reminded her to weigh daily and call for an overnight weight gain of > 2 pounds or a weekly weight gain of > 5 pounds - weight up 2 pounds from last visit here 6 months ago - echo 03/30/22: EF 55-60% with mild/ moderate MR/ AR and mild/ moderate AS. Echo 07/29/21: EF of 55-60% along with moderate/severe MR but no LVH.  - not adding salt and reviewed the importance of keeping daily sodium intake to 2000mg ; earlier today did eat some chips and cheetos but voices understanding that she "knows they are salty" - drinking ~ 60-64 ounces of fluid daily - torsemide 20mg  daily - BNP 03/30/22 was 933.7  2: HTN with CKD- - BP 113/60 - amlodipine 5mg  daily - saw nephrology Candiss Norse) 10/15/21 - BMP 04/07/22 reviewed and showed sodium 138, potassium 4.6, creatinine 1.36 & GFR 40  3: COPD- - saw pulmonology Patsey Berthold) 02/26/22 - wearing oxygen at 3L around the clock although arrived w/ her portable tank empty; placed on our tank while in the office and she says that she's going straight home from here - daily prednisone  4: Valvular disease- - saw cardiology (Povsic) 12/05/21 - plan is to monitor her valvular disease  5: Anemia- - saw hematology Tasia Catchings) 03/12/22 - saw GI Vicente Males) 11/04/21 - hemoglobin 04/08/22 was 10.9 - ferrous sulfate 325mg  BID   Return in 6 weeks, sooner if needed. If still doing well at that time, will make visits PRN.

## 2022-04-22 NOTE — Patient Instructions (Signed)
Resume weighing daily and call for an overnight weight gain of 3 pounds or more or a weekly weight gain of more than 5 pounds.  °

## 2022-04-23 ENCOUNTER — Ambulatory Visit (INDEPENDENT_AMBULATORY_CARE_PROVIDER_SITE_OTHER): Payer: Medicare PPO | Admitting: Neurosurgery

## 2022-04-23 ENCOUNTER — Encounter: Payer: Self-pay | Admitting: Neurosurgery

## 2022-04-23 VITALS — BP 115/70 | HR 86 | Ht 59.0 in | Wt 174.0 lb

## 2022-04-23 DIAGNOSIS — N2581 Secondary hyperparathyroidism of renal origin: Secondary | ICD-10-CM | POA: Insufficient documentation

## 2022-04-23 DIAGNOSIS — M4316 Spondylolisthesis, lumbar region: Secondary | ICD-10-CM

## 2022-04-23 DIAGNOSIS — M5136 Other intervertebral disc degeneration, lumbar region: Secondary | ICD-10-CM | POA: Diagnosis not present

## 2022-04-23 DIAGNOSIS — Z981 Arthrodesis status: Secondary | ICD-10-CM

## 2022-04-23 DIAGNOSIS — Z09 Encounter for follow-up examination after completed treatment for conditions other than malignant neoplasm: Secondary | ICD-10-CM

## 2022-04-23 NOTE — Progress Notes (Signed)
   DOS: May 23, 2021 (L2-4 transforaminal lumbar interbody fusion with L2-5 fixation)  HISTORY OF PRESENT ILLNESS: 04/23/2022 [name] is status post above surgery.  She is doing very well from her back standpoint.  Her symptoms are much improved.  She has had multiple different hospitalizations for pulmonary related issues.  Her medical conditions have slowly worsened with time.  PHYSICAL EXAMINATION:   Vitals:   04/23/22 1323  BP: 115/70  Pulse: 86  SpO2: 95%   General: Patient is well developed, well nourished, calm, collected, and in no apparent distress.  NEUROLOGICAL:  General: In no acute distress.  Awake, alert, oriented to person, place, and time. Pupils equal round and reactive to light.   Strength:  Side Iliopsoas Quads Hamstring PF DF EHL  R 5 5 5 5 3  -  L 5 5 5 5 3  -   Incision c/d/i   ROS (Neurologic): Negative except as noted above  IMAGING: No new imaging  ASSESSMENT/PLAN:  Kerri Carter is doing well after extension of her lumbar fusion.  She has had multiple medical events since her surgery.  She has had slow worsening of her medical condition.  At this point, she is no longer a candidate for any surgical interventions.  I will see her back on an as-needed basis.  If she has issues in the future, we will be happy to readdress and see if there are none surgical options for her.  I spent a total of 15 minutes in face-to-face and non-face-to-face activities related to this patient's care today.   Meade Maw MD, [facility] Department of Neurosurgery

## 2022-05-04 DIAGNOSIS — I5042 Chronic combined systolic (congestive) and diastolic (congestive) heart failure: Secondary | ICD-10-CM | POA: Diagnosis not present

## 2022-05-04 DIAGNOSIS — I130 Hypertensive heart and chronic kidney disease with heart failure and stage 1 through stage 4 chronic kidney disease, or unspecified chronic kidney disease: Secondary | ICD-10-CM | POA: Diagnosis not present

## 2022-05-04 DIAGNOSIS — G4733 Obstructive sleep apnea (adult) (pediatric): Secondary | ICD-10-CM | POA: Diagnosis not present

## 2022-05-04 DIAGNOSIS — J441 Chronic obstructive pulmonary disease with (acute) exacerbation: Secondary | ICD-10-CM | POA: Diagnosis not present

## 2022-05-04 DIAGNOSIS — J9621 Acute and chronic respiratory failure with hypoxia: Secondary | ICD-10-CM | POA: Diagnosis not present

## 2022-05-04 DIAGNOSIS — D631 Anemia in chronic kidney disease: Secondary | ICD-10-CM | POA: Diagnosis not present

## 2022-05-04 DIAGNOSIS — F319 Bipolar disorder, unspecified: Secondary | ICD-10-CM | POA: Diagnosis not present

## 2022-05-04 DIAGNOSIS — N1832 Chronic kidney disease, stage 3b: Secondary | ICD-10-CM | POA: Diagnosis not present

## 2022-05-04 DIAGNOSIS — D509 Iron deficiency anemia, unspecified: Secondary | ICD-10-CM | POA: Diagnosis not present

## 2022-05-05 ENCOUNTER — Inpatient Hospital Stay: Payer: Medicare PPO | Admitting: Pulmonary Disease

## 2022-05-05 ENCOUNTER — Telehealth: Payer: Self-pay | Admitting: Family Medicine

## 2022-05-05 DIAGNOSIS — J9621 Acute and chronic respiratory failure with hypoxia: Secondary | ICD-10-CM | POA: Diagnosis not present

## 2022-05-05 DIAGNOSIS — J441 Chronic obstructive pulmonary disease with (acute) exacerbation: Secondary | ICD-10-CM | POA: Diagnosis not present

## 2022-05-05 DIAGNOSIS — I5042 Chronic combined systolic (congestive) and diastolic (congestive) heart failure: Secondary | ICD-10-CM | POA: Diagnosis not present

## 2022-05-05 DIAGNOSIS — G4733 Obstructive sleep apnea (adult) (pediatric): Secondary | ICD-10-CM | POA: Diagnosis not present

## 2022-05-05 DIAGNOSIS — D631 Anemia in chronic kidney disease: Secondary | ICD-10-CM | POA: Diagnosis not present

## 2022-05-05 DIAGNOSIS — D509 Iron deficiency anemia, unspecified: Secondary | ICD-10-CM | POA: Diagnosis not present

## 2022-05-05 DIAGNOSIS — I130 Hypertensive heart and chronic kidney disease with heart failure and stage 1 through stage 4 chronic kidney disease, or unspecified chronic kidney disease: Secondary | ICD-10-CM | POA: Diagnosis not present

## 2022-05-05 DIAGNOSIS — N1832 Chronic kidney disease, stage 3b: Secondary | ICD-10-CM | POA: Diagnosis not present

## 2022-05-05 DIAGNOSIS — F319 Bipolar disorder, unspecified: Secondary | ICD-10-CM | POA: Diagnosis not present

## 2022-05-05 NOTE — Telephone Encounter (Signed)
I called to speak with Kerri Carter and she does not have a VM setup.  See message about medication.  Durga Saldarriaga,cma

## 2022-05-05 NOTE — Telephone Encounter (Signed)
Kerri Carter from [place], [phone]. Kerri Carter wanted to report a medication interaction with escitalopram (LEXAPRO) 10 MG tablet and QUEtiapine (SEROQUEL) 25 MG tablet.  Also, Patient is not taking vitamin D she is a multi vitamin.

## 2022-05-06 NOTE — Telephone Encounter (Signed)
I called and spoke with Kerri Carter and informed her that the patient needed to get the psychiatric medication from her psychiatrist and I gave her the name and number of Nicole [facility]red.  Jajaira Ruis,cma

## 2022-05-08 DIAGNOSIS — D631 Anemia in chronic kidney disease: Secondary | ICD-10-CM | POA: Diagnosis not present

## 2022-05-08 DIAGNOSIS — N1832 Chronic kidney disease, stage 3b: Secondary | ICD-10-CM | POA: Diagnosis not present

## 2022-05-08 DIAGNOSIS — D509 Iron deficiency anemia, unspecified: Secondary | ICD-10-CM | POA: Diagnosis not present

## 2022-05-08 DIAGNOSIS — G4733 Obstructive sleep apnea (adult) (pediatric): Secondary | ICD-10-CM | POA: Diagnosis not present

## 2022-05-08 DIAGNOSIS — I130 Hypertensive heart and chronic kidney disease with heart failure and stage 1 through stage 4 chronic kidney disease, or unspecified chronic kidney disease: Secondary | ICD-10-CM | POA: Diagnosis not present

## 2022-05-08 DIAGNOSIS — J9621 Acute and chronic respiratory failure with hypoxia: Secondary | ICD-10-CM | POA: Diagnosis not present

## 2022-05-08 DIAGNOSIS — J441 Chronic obstructive pulmonary disease with (acute) exacerbation: Secondary | ICD-10-CM | POA: Diagnosis not present

## 2022-05-08 DIAGNOSIS — F319 Bipolar disorder, unspecified: Secondary | ICD-10-CM | POA: Diagnosis not present

## 2022-05-08 DIAGNOSIS — I5042 Chronic combined systolic (congestive) and diastolic (congestive) heart failure: Secondary | ICD-10-CM | POA: Diagnosis not present

## 2022-05-11 DIAGNOSIS — F319 Bipolar disorder, unspecified: Secondary | ICD-10-CM | POA: Diagnosis not present

## 2022-05-11 DIAGNOSIS — D509 Iron deficiency anemia, unspecified: Secondary | ICD-10-CM | POA: Diagnosis not present

## 2022-05-11 DIAGNOSIS — G4733 Obstructive sleep apnea (adult) (pediatric): Secondary | ICD-10-CM | POA: Diagnosis not present

## 2022-05-11 DIAGNOSIS — N1832 Chronic kidney disease, stage 3b: Secondary | ICD-10-CM | POA: Diagnosis not present

## 2022-05-11 DIAGNOSIS — J9621 Acute and chronic respiratory failure with hypoxia: Secondary | ICD-10-CM | POA: Diagnosis not present

## 2022-05-11 DIAGNOSIS — I5042 Chronic combined systolic (congestive) and diastolic (congestive) heart failure: Secondary | ICD-10-CM | POA: Diagnosis not present

## 2022-05-11 DIAGNOSIS — J441 Chronic obstructive pulmonary disease with (acute) exacerbation: Secondary | ICD-10-CM | POA: Diagnosis not present

## 2022-05-11 DIAGNOSIS — D631 Anemia in chronic kidney disease: Secondary | ICD-10-CM | POA: Diagnosis not present

## 2022-05-11 DIAGNOSIS — I130 Hypertensive heart and chronic kidney disease with heart failure and stage 1 through stage 4 chronic kidney disease, or unspecified chronic kidney disease: Secondary | ICD-10-CM | POA: Diagnosis not present

## 2022-05-12 ENCOUNTER — Telehealth: Payer: Self-pay | Admitting: Family Medicine

## 2022-05-12 NOTE — Telephone Encounter (Signed)
Pt called stating she would like for the provider to call in medication for her mouth. She states she thinks she has thrush in her mouth. Pt would like to be called

## 2022-05-12 NOTE — Telephone Encounter (Signed)
I called the patient and informed her that she would need to see the provider, I held a appointment of 11:40 am for the patient on tomorrow and she is calling back if she cannot find a ride to that appointment.  Heraclio Seidman,cma

## 2022-05-13 ENCOUNTER — Ambulatory Visit: Payer: Medicare PPO | Admitting: Family Medicine

## 2022-05-13 ENCOUNTER — Encounter: Payer: Self-pay | Admitting: Family Medicine

## 2022-05-13 VITALS — BP 130/70 | HR 79 | Temp 98.2°F | Ht 59.5 in | Wt 171.0 lb

## 2022-05-13 DIAGNOSIS — B370 Candidal stomatitis: Secondary | ICD-10-CM

## 2022-05-13 MED ORDER — NYSTATIN 100000 UNIT/ML MT SUSP: 5.0000 mL | Freq: Four times a day (QID) | OROMUCOSAL | 0 refills | Status: AC

## 2022-05-13 NOTE — Progress Notes (Signed)
   SUBJECTIVE:   Chief Complaint  Patient presents with   Acute Visit    Possible thrush in mouth/ purple mouth/ burning with food  or drink X 2 weeks   HPI Patient presents to clinic for painful sore mouth. Was recently in hospital and treated for COPD exacerbation with antibiotics.  Reports symptoms started 2 weeks ago.  Is having difficulty with chewing food, causing burning sensation in mouth.  No vesicles or blisters endorsed.  Denies any fevers, nausea/vomiting or painful swallowing.  Has had symptoms similar in past and treated for thrush.  Chronic systemic steroid use.  Endorses rinsing mouth after using inhalers.   PERTINENT PMH / PSH: COPD, on ICS therapy and systemic steroids   OBJECTIVE:  BP 130/70   Pulse 79   Temp 98.2 F (36.8 C) (Oral)   Ht 4' 11.5" (1.511 m)   Wt 171 lb (77.6 kg)   SpO2 97%   BMI 33.96 kg/m    Physical Exam HENT:     Mouth/Throat:     Lips: No lesions.     Mouth: Mucous membranes are moist.     Dentition: Has dentures.     Tongue: No lesions.     Pharynx: No uvula swelling.     Tonsils: No tonsillar exudate.     Comments: Yellow plaques noted on gingival area upper and lower underneath dentures.  Painful to palpation.    ASSESSMENT/PLAN:  Oral candida Assessment & Plan: Start nystatin swish and swallow 4 times daily x 5 to 7 days. Recommend removal of dentures unless eating. If symptoms worsen follow-up with PCP  Orders: -     Nystatin; Take 5 mLs (500,000 Units total) by mouth 4 (four) times daily for 7 days.  Dispense: 120 mL; Refill: 0   PDMP reviewed  Return if symptoms worsen or fail to improve.  Dana Allan, MD

## 2022-05-13 NOTE — Patient Instructions (Signed)
It was a pleasure meeting you today. Thank you for allowing me to take part in your health care.  Our goals for today as we discussed include:  Use Nystatin liquid swish and swallow 4 times a day for 5-7 days  Remove dentures when not eating.  If symptoms worsen notify MD   If you have any questions or concerns, please do not hesitate to call the office at [phone].  I look forward to our next visit and until then take care and stay safe.  Regards,   Dana Allan, MD   [facility]

## 2022-05-15 DIAGNOSIS — F319 Bipolar disorder, unspecified: Secondary | ICD-10-CM | POA: Diagnosis not present

## 2022-05-15 DIAGNOSIS — D509 Iron deficiency anemia, unspecified: Secondary | ICD-10-CM | POA: Diagnosis not present

## 2022-05-15 DIAGNOSIS — I5042 Chronic combined systolic (congestive) and diastolic (congestive) heart failure: Secondary | ICD-10-CM | POA: Diagnosis not present

## 2022-05-15 DIAGNOSIS — N1832 Chronic kidney disease, stage 3b: Secondary | ICD-10-CM | POA: Diagnosis not present

## 2022-05-15 DIAGNOSIS — I130 Hypertensive heart and chronic kidney disease with heart failure and stage 1 through stage 4 chronic kidney disease, or unspecified chronic kidney disease: Secondary | ICD-10-CM | POA: Diagnosis not present

## 2022-05-15 DIAGNOSIS — D631 Anemia in chronic kidney disease: Secondary | ICD-10-CM | POA: Diagnosis not present

## 2022-05-15 DIAGNOSIS — J9621 Acute and chronic respiratory failure with hypoxia: Secondary | ICD-10-CM | POA: Diagnosis not present

## 2022-05-15 DIAGNOSIS — J441 Chronic obstructive pulmonary disease with (acute) exacerbation: Secondary | ICD-10-CM | POA: Diagnosis not present

## 2022-05-15 DIAGNOSIS — G4733 Obstructive sleep apnea (adult) (pediatric): Secondary | ICD-10-CM | POA: Diagnosis not present

## 2022-05-17 ENCOUNTER — Encounter: Payer: Self-pay | Admitting: Family Medicine

## 2022-05-17 DIAGNOSIS — B370 Candidal stomatitis: Secondary | ICD-10-CM | POA: Insufficient documentation

## 2022-05-17 NOTE — Assessment & Plan Note (Signed)
Start nystatin swish and swallow 4 times daily x 5 to 7 days. Recommend removal of dentures unless eating. If symptoms worsen follow-up with PCP

## 2022-05-18 DIAGNOSIS — G4733 Obstructive sleep apnea (adult) (pediatric): Secondary | ICD-10-CM | POA: Diagnosis not present

## 2022-05-18 DIAGNOSIS — D509 Iron deficiency anemia, unspecified: Secondary | ICD-10-CM | POA: Diagnosis not present

## 2022-05-18 DIAGNOSIS — J441 Chronic obstructive pulmonary disease with (acute) exacerbation: Secondary | ICD-10-CM | POA: Diagnosis not present

## 2022-05-18 DIAGNOSIS — F319 Bipolar disorder, unspecified: Secondary | ICD-10-CM | POA: Diagnosis not present

## 2022-05-18 DIAGNOSIS — D631 Anemia in chronic kidney disease: Secondary | ICD-10-CM | POA: Diagnosis not present

## 2022-05-18 DIAGNOSIS — N1832 Chronic kidney disease, stage 3b: Secondary | ICD-10-CM | POA: Diagnosis not present

## 2022-05-18 DIAGNOSIS — I130 Hypertensive heart and chronic kidney disease with heart failure and stage 1 through stage 4 chronic kidney disease, or unspecified chronic kidney disease: Secondary | ICD-10-CM | POA: Diagnosis not present

## 2022-05-18 DIAGNOSIS — I5042 Chronic combined systolic (congestive) and diastolic (congestive) heart failure: Secondary | ICD-10-CM | POA: Diagnosis not present

## 2022-05-18 DIAGNOSIS — J9621 Acute and chronic respiratory failure with hypoxia: Secondary | ICD-10-CM | POA: Diagnosis not present

## 2022-05-21 ENCOUNTER — Ambulatory Visit: Payer: Medicare PPO | Admitting: Family Medicine

## 2022-05-21 VITALS — BP 138/60 | HR 77 | Temp 99.0°F | Ht 59.5 in | Wt 170.8 lb

## 2022-05-21 DIAGNOSIS — B370 Candidal stomatitis: Secondary | ICD-10-CM | POA: Diagnosis not present

## 2022-05-21 DIAGNOSIS — I10 Essential (primary) hypertension: Secondary | ICD-10-CM | POA: Diagnosis not present

## 2022-05-21 NOTE — Progress Notes (Signed)
   SUBJECTIVE:   Chief Complaint  Patient presents with   Oral Pain    Follow up from thrush.  Patient reports that her mouth feels better.     HPI Patient presents to clinic for follow-up thrush.  Last seen in clinic 1 week ago for oral Candida and treated with nystatin swish and swallow.  Since then patient reports symptoms have resolved  Hypertension Asymptomatic.  Compliant with Norvasc 5 mg, torsemide 20 mg daily and Bystolic 5 mg twice daily tolerating medication well.   PERTINENT PMH / PSH: COPD, O2 dependent, 3 L nasal cannula Hypertension   OBJECTIVE:  BP 138/60   Pulse 77   Temp 99 F (37.2 C) (Oral)   Ht 4' 11.5" (1.511 m)   Wt 170 lb 12.8 oz (77.5 kg)   SpO2 98%   BMI 33.92 kg/m    Physical Exam Vitals reviewed.  Constitutional:      General: She is not in acute distress.    Appearance: Normal appearance. She is obese. She is not ill-appearing, toxic-appearing or diaphoretic.  HENT:     Mouth/Throat:     Lips: Pink.     Mouth: Mucous membranes are moist.     Dentition: Has dentures. No gingival swelling.     Tongue: No lesions.     Pharynx: Oropharynx is clear.  Eyes:     General:        Right eye: No discharge.        Left eye: No discharge.     Conjunctiva/sclera: Conjunctivae normal.  Cardiovascular:     Rate and Rhythm: Normal rate.  Pulmonary:     Effort: Pulmonary effort is normal.  Neurological:     Mental Status: She is alert and oriented to person, place, and time. Mental status is at baseline.  Psychiatric:        Mood and Affect: Mood normal.        Behavior: Behavior normal.        Thought Content: Thought content normal.        Judgment: Judgment normal.     ASSESSMENT/PLAN:  Oral candida Assessment & Plan: Resolved   Essential hypertension Assessment & Plan: Chronic.  Stable. Continue Norvasc 5 mg daily Continue torsemide 20 mg daily Continue Bystolic 5 mg twice daily Follow-up as needed.    PDMP  reviewed  Return in about 6 months (around 11/20/2022) for PCP.  Dana Allan, MD

## 2022-05-21 NOTE — Patient Instructions (Addendum)
It was a pleasure meeting you today. Thank you for allowing me to take part in your health care.  Our goals for today as we discussed include:  Schedule Medicare Annual Wellness Visit   Follow up in 6 months with PCP or sooner if needed  If you have any questions or concerns, please do not hesitate to call the office at [phone].  I look forward to our next visit and until then take care and stay safe.  Regards,   Dana Allan, MD   [facility]

## 2022-05-22 ENCOUNTER — Telehealth: Payer: Self-pay

## 2022-05-22 IMAGING — XA DG LUMBAR SPINE 2-3V
1 series · 1 of 1 positions shown · non-contrast
Comparison: Earlier the same day

CLINICAL DATA: 80-year-old female, spinal fusion, intraoperative
evaluation.

EXAM:
LUMBAR SPINE - 2-3 VIEW

[Series 1: dg x-ray · 0.20mm/px · 1 of 1 slices shown]
[im 1/1]
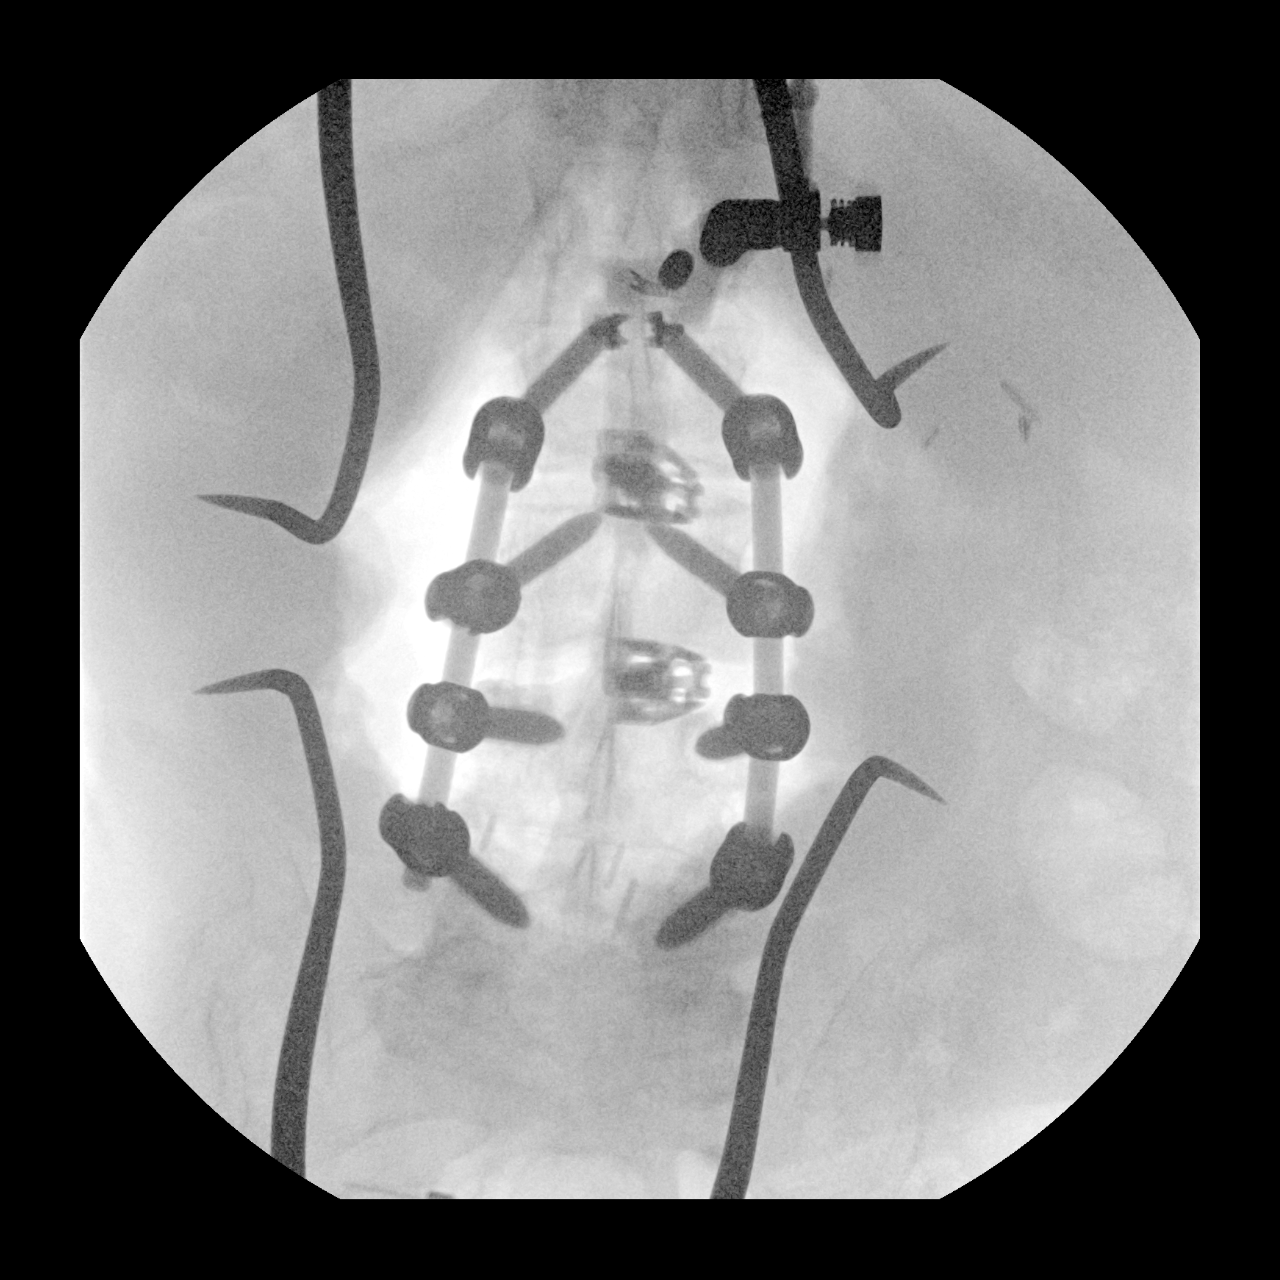

[1 of 1 positions shown; findings below may reference images not displayed]

FINDINGS: Single approach intraoperative frontal projection demonstrating
posterior spinal instrumented fusion from L2-L5 with bilateral
pedicle screws and dorsal right constructs.
IMPRESSION: L2-L5 PSIF, intraoperative image.

## 2022-05-22 NOTE — Telephone Encounter (Signed)
Called Pt to get scheduled for a hospital follow up per the schedules message below:  Scheduling Message Entered by Babette Relic on 04/21/2022 at  1:06 PM Priority: [facility] FU  Department: [facility]  Provider:  Scheduling Notes:  Dr Clent Ridges does not have any available appointments in the next week. Please read note below.    ----- Message -----  From: Karena Addison, LPN  Sent: 1/61/0960  10:39 AM EDT  To: Scheduling Message Pool    Patient needs hospital follow up within 1 week. No avail appt times. She also has to give 48 hr notice for her ride. Please schedule and inform patient. thanks         She stated she already had the follow  up  on yesterday I misread the date

## 2022-05-24 DIAGNOSIS — J441 Chronic obstructive pulmonary disease with (acute) exacerbation: Secondary | ICD-10-CM | POA: Diagnosis not present

## 2022-05-25 DIAGNOSIS — N1832 Chronic kidney disease, stage 3b: Secondary | ICD-10-CM | POA: Diagnosis not present

## 2022-05-25 DIAGNOSIS — J441 Chronic obstructive pulmonary disease with (acute) exacerbation: Secondary | ICD-10-CM | POA: Diagnosis not present

## 2022-05-25 DIAGNOSIS — F319 Bipolar disorder, unspecified: Secondary | ICD-10-CM | POA: Diagnosis not present

## 2022-05-25 DIAGNOSIS — D509 Iron deficiency anemia, unspecified: Secondary | ICD-10-CM | POA: Diagnosis not present

## 2022-05-25 DIAGNOSIS — I5042 Chronic combined systolic (congestive) and diastolic (congestive) heart failure: Secondary | ICD-10-CM | POA: Diagnosis not present

## 2022-05-25 DIAGNOSIS — G4733 Obstructive sleep apnea (adult) (pediatric): Secondary | ICD-10-CM | POA: Diagnosis not present

## 2022-05-25 DIAGNOSIS — D631 Anemia in chronic kidney disease: Secondary | ICD-10-CM | POA: Diagnosis not present

## 2022-05-25 DIAGNOSIS — I130 Hypertensive heart and chronic kidney disease with heart failure and stage 1 through stage 4 chronic kidney disease, or unspecified chronic kidney disease: Secondary | ICD-10-CM | POA: Diagnosis not present

## 2022-05-25 DIAGNOSIS — J9621 Acute and chronic respiratory failure with hypoxia: Secondary | ICD-10-CM | POA: Diagnosis not present

## 2022-05-25 IMAGING — US US EXTREM LOW VENOUS
1 series · 13 of 24 positions shown · non-contrast
Comparison: None.

CLINICAL DATA: Bilateral lower extremity edema



[Series 1: us venous img lower bilat (dvt) · portal-venous · 13 of 58 slices shown]
[im 1/58]
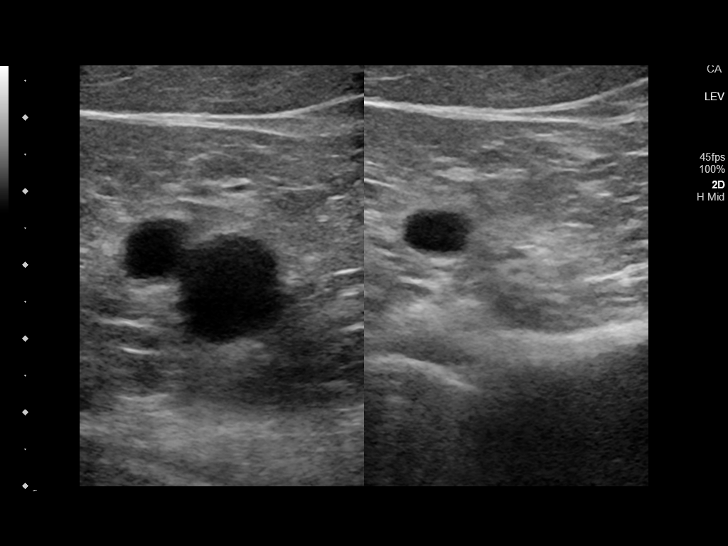
[im 5/58]
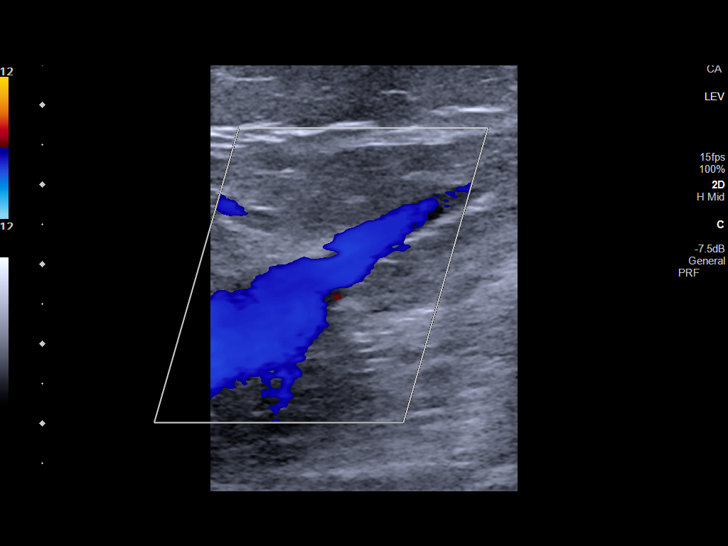
[im 10/58]
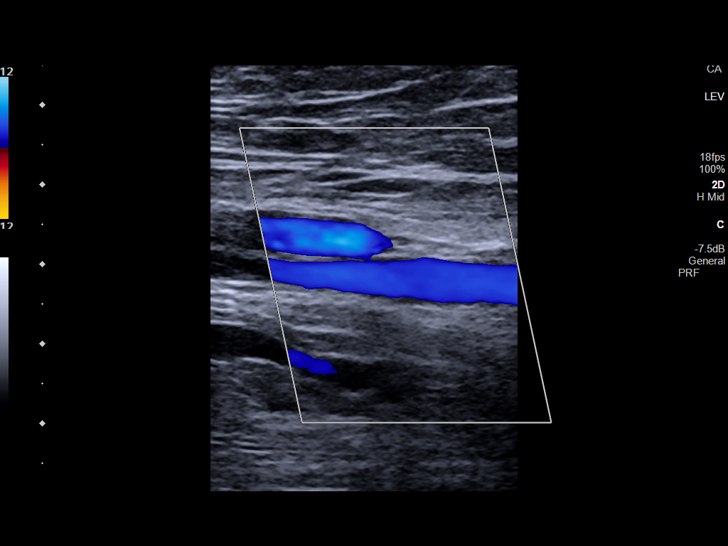
[im 15/58]
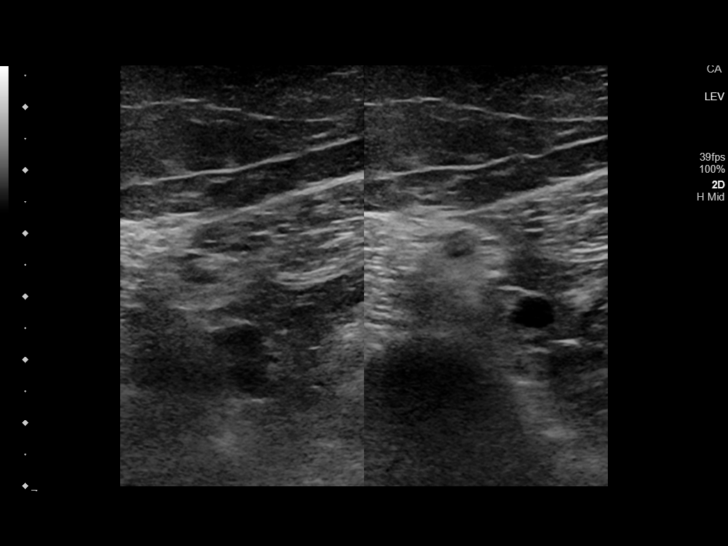
[im 20/58]
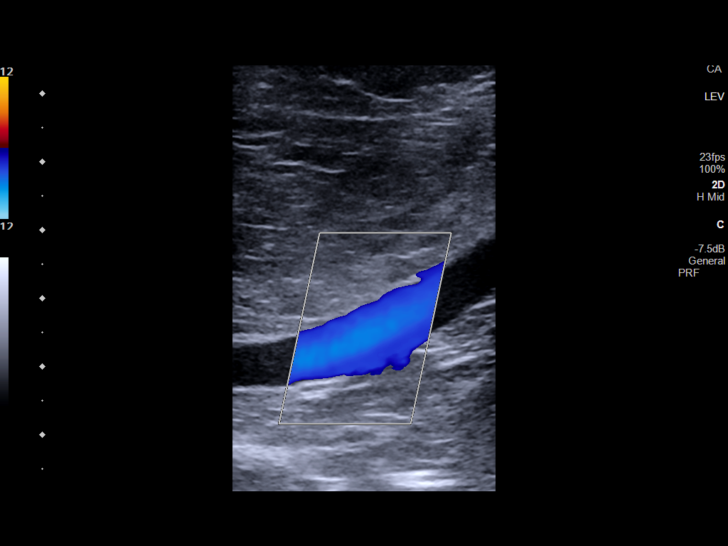
[im 25/58]
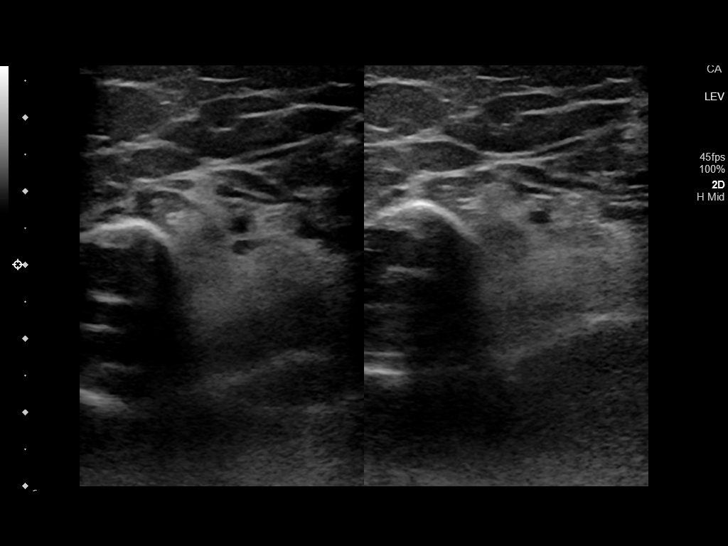
[im 30/58]
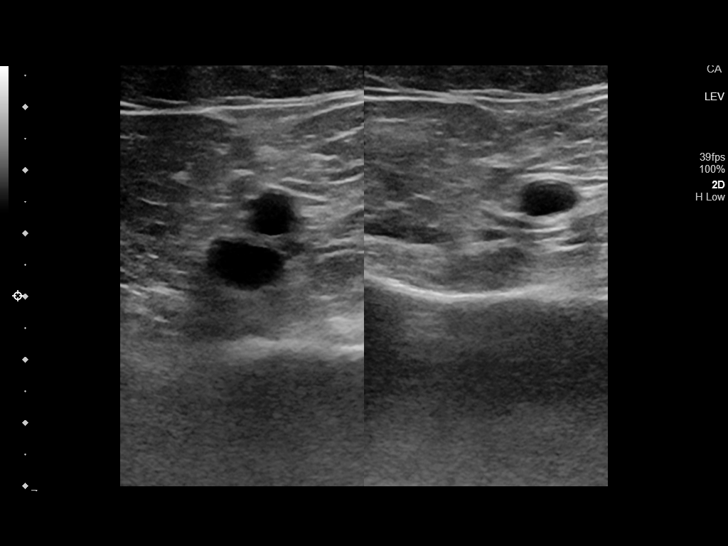
[im 33/58]
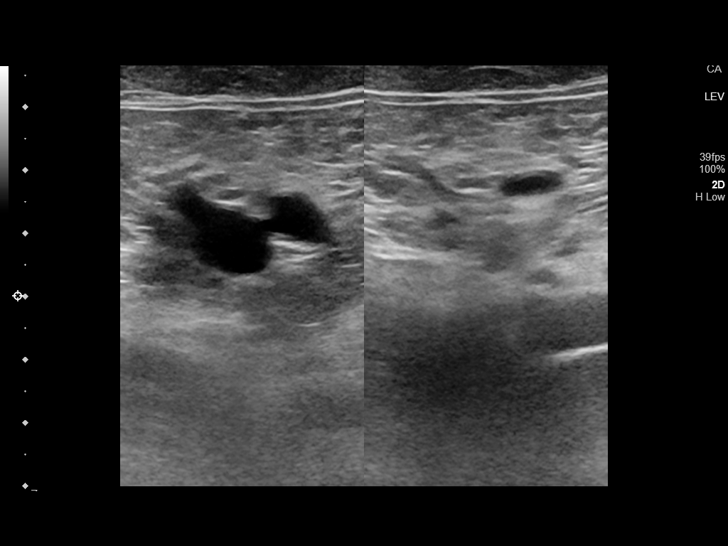
[im 38/58]
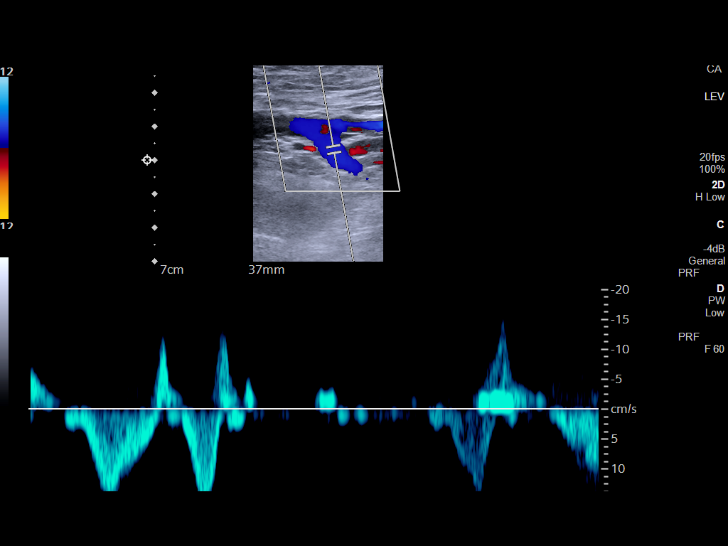
[im 43/58]
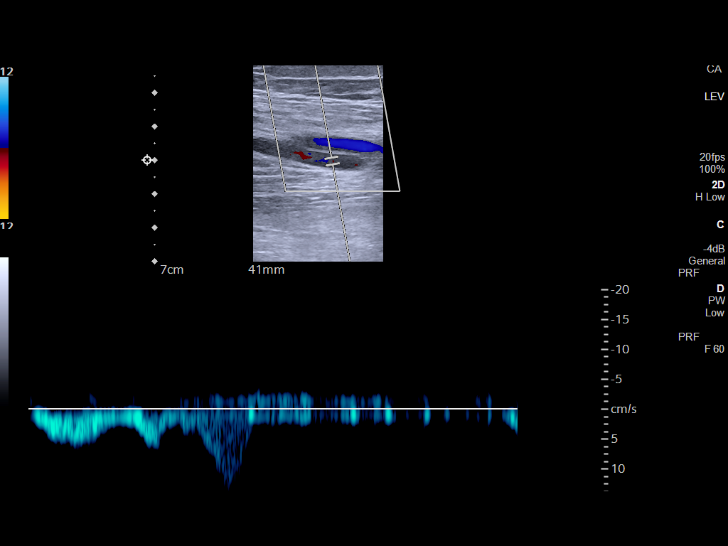
[im 48/58]
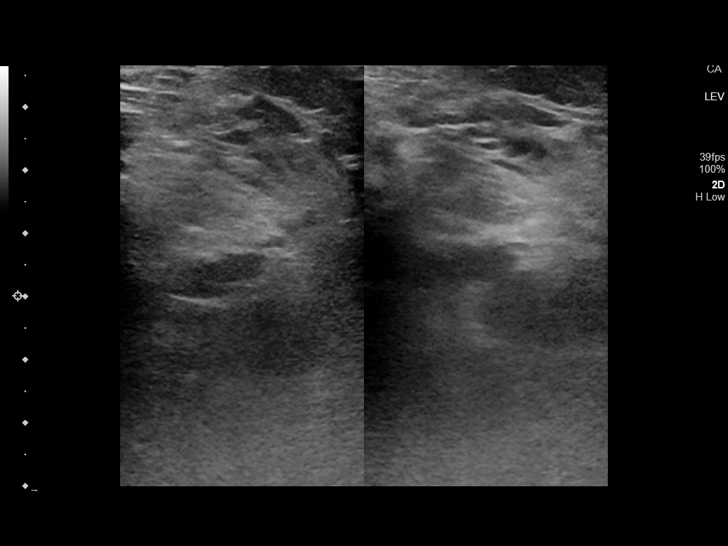
[im 53/58]
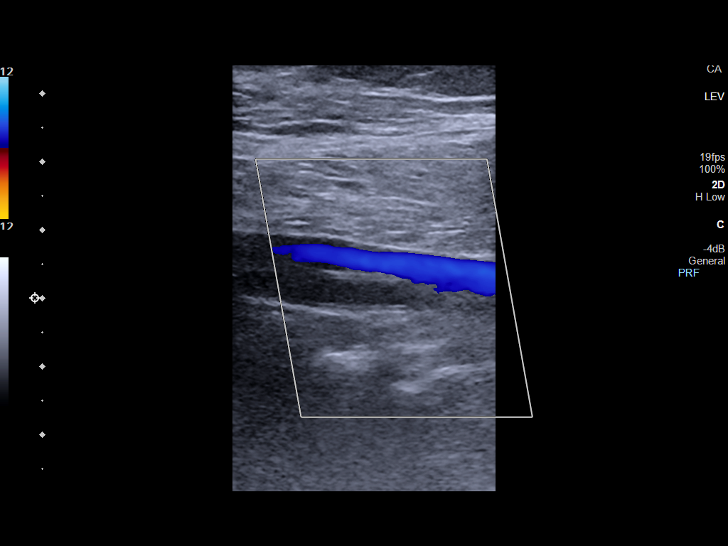
[im 58/58]
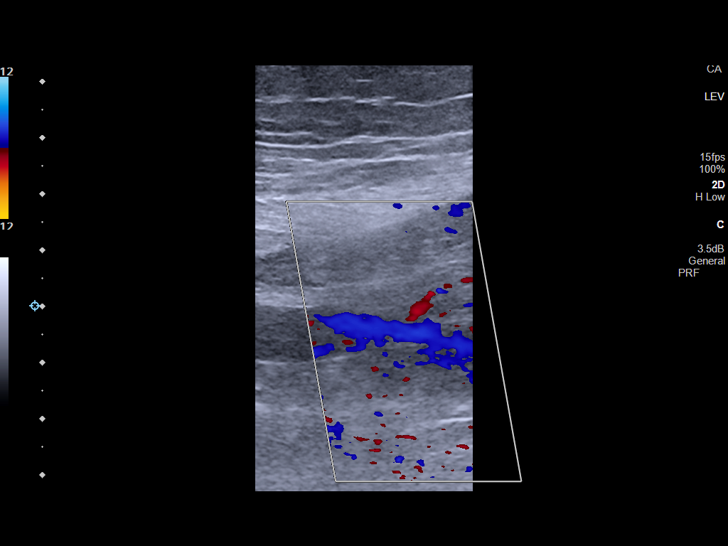

[13 of 24 positions shown; findings below may reference images not displayed]

FINDINGS: RIGHT LOWER EXTREMITY

Common Femoral Vein: No evidence of thrombus. Normal
compressibility, respiratory phasicity and response to augmentation.

Saphenofemoral Junction: No evidence of thrombus. Normal
compressibility and flow on color Doppler imaging.

Profunda Femoral Vein: No evidence of thrombus. Normal
compressibility and flow on color Doppler imaging.

Femoral Vein: No evidence of thrombus. Normal compressibility,
respiratory phasicity and response to augmentation.

Popliteal Vein: No evidence of thrombus. Normal compressibility,
respiratory phasicity and response to augmentation.

Calf Veins: No evidence of thrombus. Normal compressibility and flow
on color Doppler imaging.

Superficial Great Saphenous Vein: No evidence of thrombus. Normal
compressibility.

Venous Reflux:  None.

Other Findings:  None.

LEFT LOWER EXTREMITY

Common Femoral Vein: No evidence of thrombus. Normal
compressibility, respiratory phasicity and response to augmentation.

Saphenofemoral Junction: No evidence of thrombus. Normal
compressibility and flow on color Doppler imaging.

Profunda Femoral Vein: No evidence of thrombus. Normal
compressibility and flow on color Doppler imaging.

Femoral Vein: No evidence of thrombus. Normal compressibility,
respiratory phasicity and response to augmentation.

Popliteal Vein: No evidence of thrombus. Normal compressibility,
respiratory phasicity and response to augmentation.

Calf Veins: No evidence of thrombus. Normal compressibility and flow
on color Doppler imaging.

Superficial Great Saphenous Vein: No evidence of thrombus. Normal
compressibility.

Venous Reflux:  None.

Other Findings:  None.
IMPRESSION: No evidence of deep venous thrombosis in either lower extremity.

## 2022-05-25 IMAGING — NM NM PULMONARY PERF PARTICULATE
1 series · 8 of 8 positions shown · non-contrast
Comparison: Chest radiograph 05/26/2021

CLINICAL DATA: Elevated D-dimer, COPD, asthma, acute hypoxia 3 days
post lumbar fusion question pulmonary embolism

EXAM:
NUCLEAR MEDICINE PERFUSION LUNG SCAN
TECHNIQUE: Perfusion images were obtained in multiple projections after
intravenous injection of radiopharmaceutical.
Ventilation scans intentionally deferred if perfusion scan and chest
x-ray adequate for interpretation during COVID 19 epidemic.
RADIOPHARMACEUTICALS:  4.07 mCi Lc-22m MAA IV

[Series 1000: lung perfusion · 1.65mm/px · 4 acquisitions, 8 frames shown]
[im 1/4]
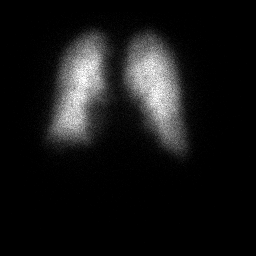
[im 1/4]
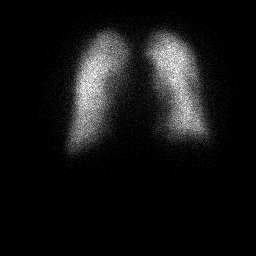
[im 2/4]
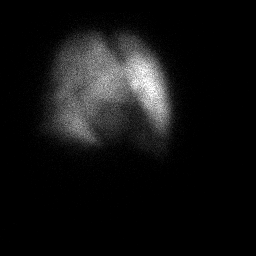
[im 2/4]
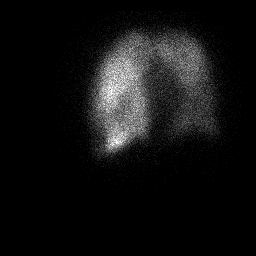
[im 3/4]
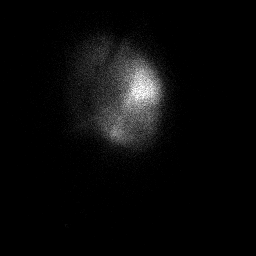
[im 3/4]
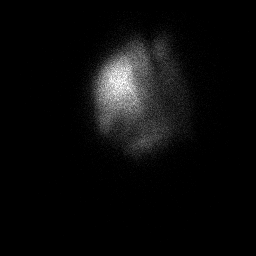
[im 4/4]
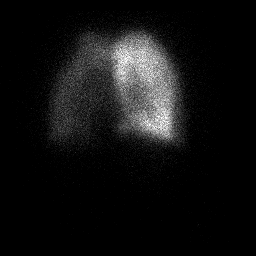
[im 4/4]
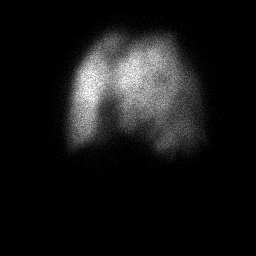

[8 of 8 positions shown; findings below may reference images not displayed]

FINDINGS: Patchy nonsegmental diminished perfusion LEFT lower lobe, less
severe than infiltrate on earlier radiograph.

No segmental or subsegmental perfusion defects.

Perfusion much better than expected based on chest radiograph.
IMPRESSION: Pulmonary embolism absent.

## 2022-05-26 DIAGNOSIS — J441 Chronic obstructive pulmonary disease with (acute) exacerbation: Secondary | ICD-10-CM | POA: Diagnosis not present

## 2022-05-26 DIAGNOSIS — D631 Anemia in chronic kidney disease: Secondary | ICD-10-CM | POA: Diagnosis not present

## 2022-05-26 DIAGNOSIS — G4733 Obstructive sleep apnea (adult) (pediatric): Secondary | ICD-10-CM | POA: Diagnosis not present

## 2022-05-26 DIAGNOSIS — I130 Hypertensive heart and chronic kidney disease with heart failure and stage 1 through stage 4 chronic kidney disease, or unspecified chronic kidney disease: Secondary | ICD-10-CM | POA: Diagnosis not present

## 2022-05-26 DIAGNOSIS — D509 Iron deficiency anemia, unspecified: Secondary | ICD-10-CM | POA: Diagnosis not present

## 2022-05-26 DIAGNOSIS — I5042 Chronic combined systolic (congestive) and diastolic (congestive) heart failure: Secondary | ICD-10-CM | POA: Diagnosis not present

## 2022-05-26 DIAGNOSIS — J9621 Acute and chronic respiratory failure with hypoxia: Secondary | ICD-10-CM | POA: Diagnosis not present

## 2022-05-26 DIAGNOSIS — N1832 Chronic kidney disease, stage 3b: Secondary | ICD-10-CM | POA: Diagnosis not present

## 2022-05-26 DIAGNOSIS — F319 Bipolar disorder, unspecified: Secondary | ICD-10-CM | POA: Diagnosis not present

## 2022-05-26 IMAGING — DX DG CHEST 1V PORT
1 series · 1 of 1 positions shown · non-contrast
Comparison: 05/26/2021

CLINICAL DATA: Respiratory failure

EXAM:
PORTABLE CHEST 1 VIEW

[chest ap]
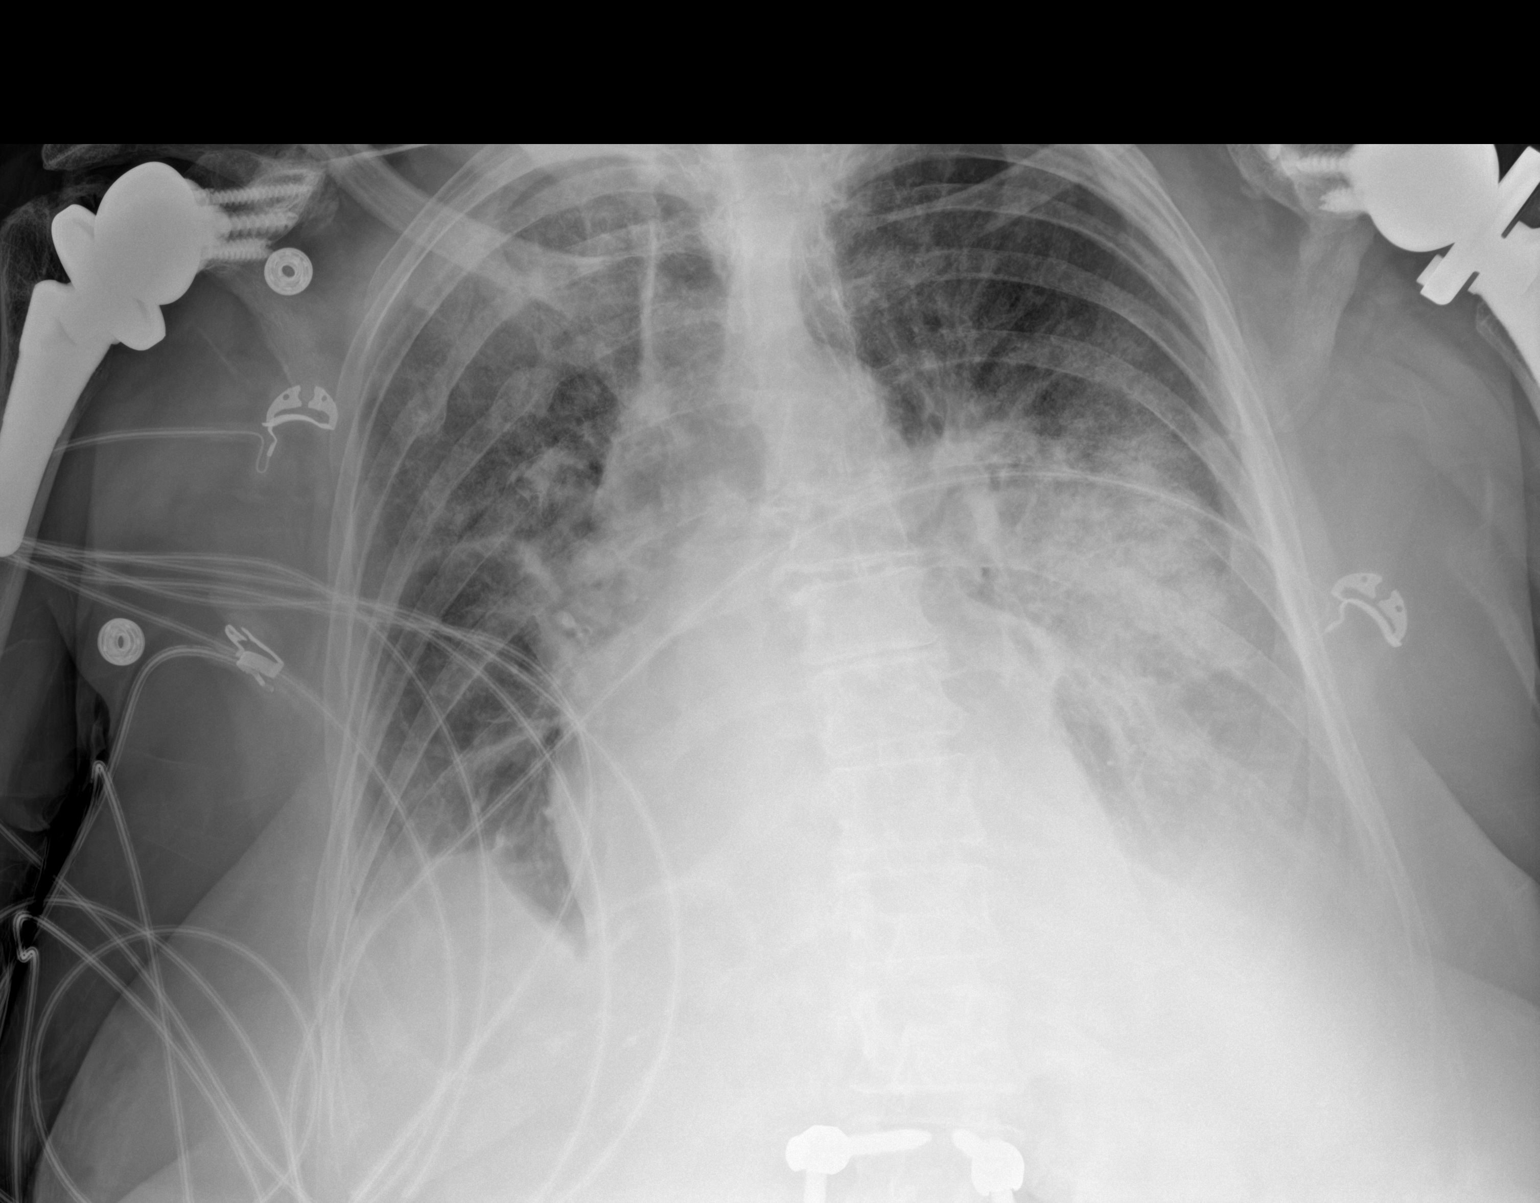

[1 of 1 positions shown; findings below may reference images not displayed]

FINDINGS: No significant change in AP portable chest radiograph with diffuse
bilateral interstitial opacity, more focal and masslike in the left
midlung, and layering bilateral pleural effusions. Cardiomegaly.
Status post bilateral shoulder reverse arthroplasty.
IMPRESSION: No significant change in AP portable chest radiograph with diffuse
bilateral interstitial opacity, more focal and masslike in the left
midlung, and layering bilateral pleural effusions.

## 2022-05-27 IMAGING — DX DG CHEST 1V PORT
1 series · 1 of 1 positions shown · non-contrast
Comparison: Chest x-ray 05/27/2021, CT chest 05/01/2020

CLINICAL DATA: Acute respiratory failure.

EXAM:
PORTABLE CHEST 1 VIEW

[chest ap]
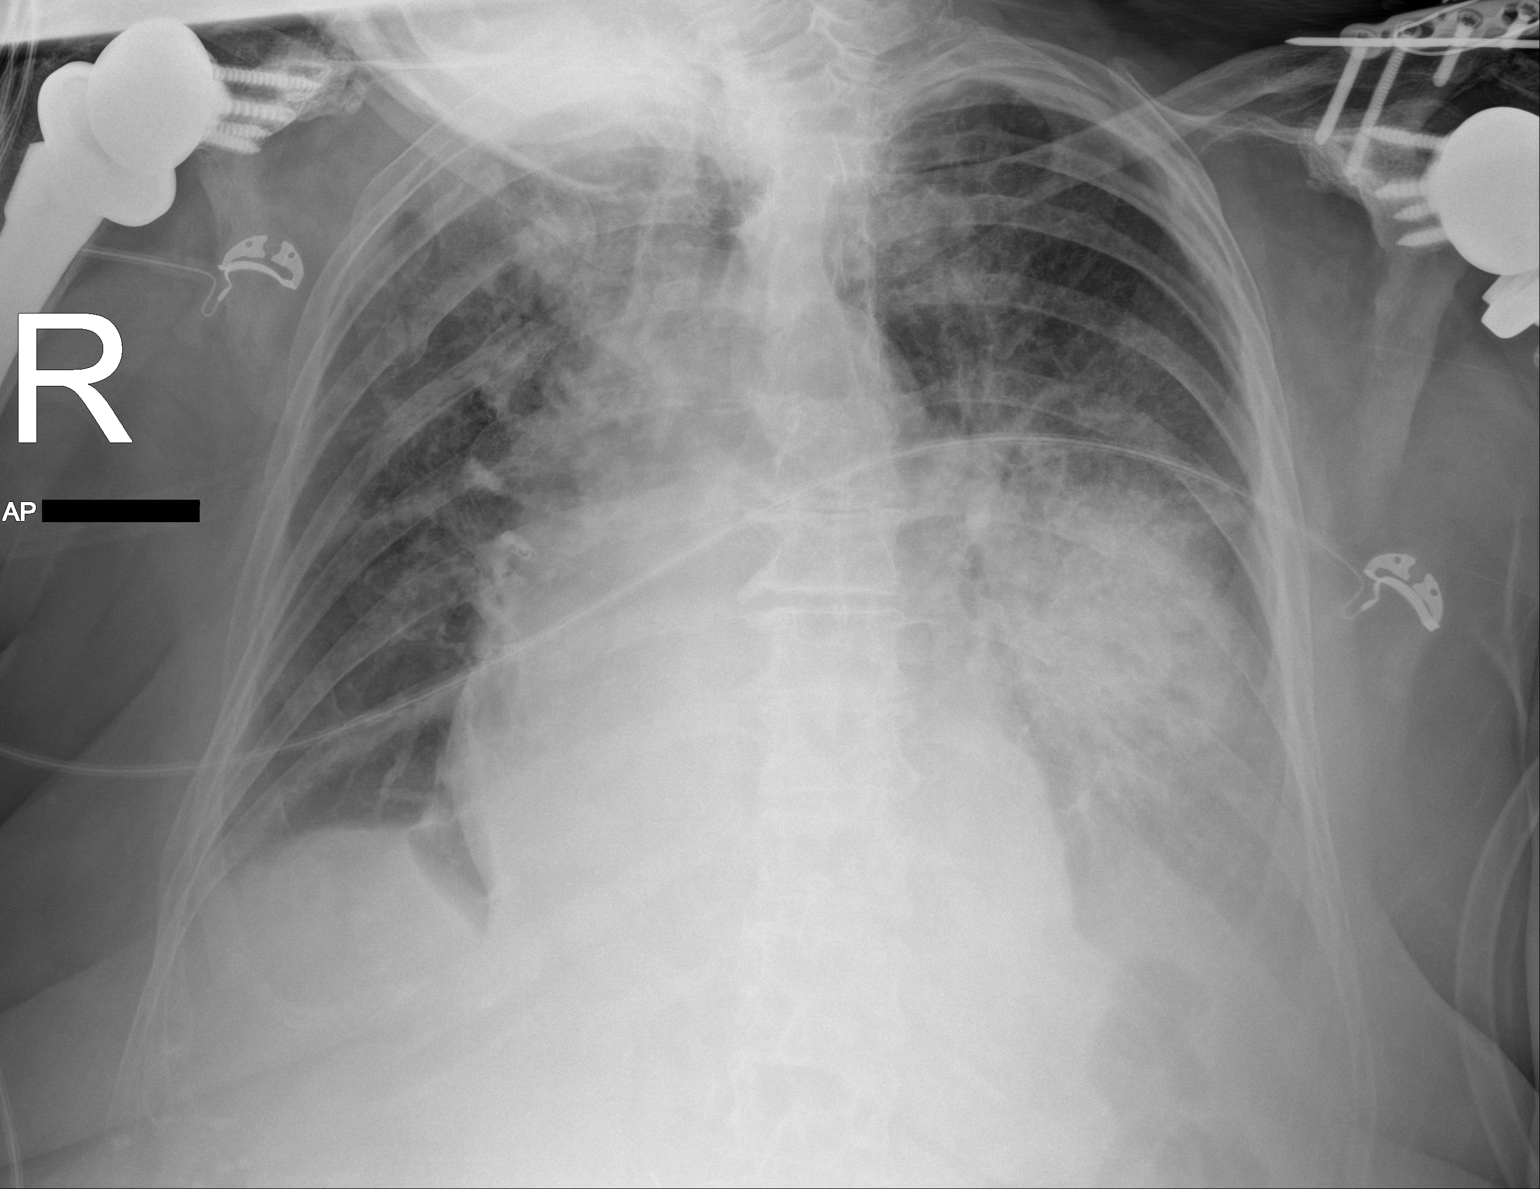

[1 of 1 positions shown; findings below may reference images not displayed]

FINDINGS: Bilateral interstitial thickening. Focal left perihilar airspace
disease. No pneumothorax. Trace bilateral pleural effusions. Stable
cardiomegaly.

No acute osseous abnormality. Bilateral reverse shoulder
arthroplasties.
IMPRESSION: 1. Focal left perihilar airspace disease concerning for pneumonia.
Bilateral interstitial thickening likely reflecting mild underlying
pulmonary vascular congestion.

## 2022-05-28 ENCOUNTER — Encounter: Payer: Self-pay | Admitting: Pulmonary Disease

## 2022-05-28 ENCOUNTER — Ambulatory Visit: Payer: Medicare PPO | Admitting: Pulmonary Disease

## 2022-05-28 VITALS — BP 124/82 | HR 84 | Temp 97.5°F | Ht 59.5 in | Wt 171.0 lb

## 2022-05-28 DIAGNOSIS — J9611 Chronic respiratory failure with hypoxia: Secondary | ICD-10-CM | POA: Diagnosis not present

## 2022-05-28 DIAGNOSIS — I080 Rheumatic disorders of both mitral and aortic valves: Secondary | ICD-10-CM

## 2022-05-28 DIAGNOSIS — J4489 Other specified chronic obstructive pulmonary disease: Secondary | ICD-10-CM

## 2022-05-28 DIAGNOSIS — J8489 Other specified interstitial pulmonary diseases: Secondary | ICD-10-CM | POA: Diagnosis not present

## 2022-05-28 DIAGNOSIS — M0579 Rheumatoid arthritis with rheumatoid factor of multiple sites without organ or systems involvement: Secondary | ICD-10-CM

## 2022-05-28 IMAGING — DX DG CHEST 1V PORT
1 series · 1 of 1 positions shown · non-contrast
Comparison: 05/28/2021

CLINICAL DATA: Hypoxia

EXAM:
PORTABLE CHEST 1 VIEW

[chest ap]
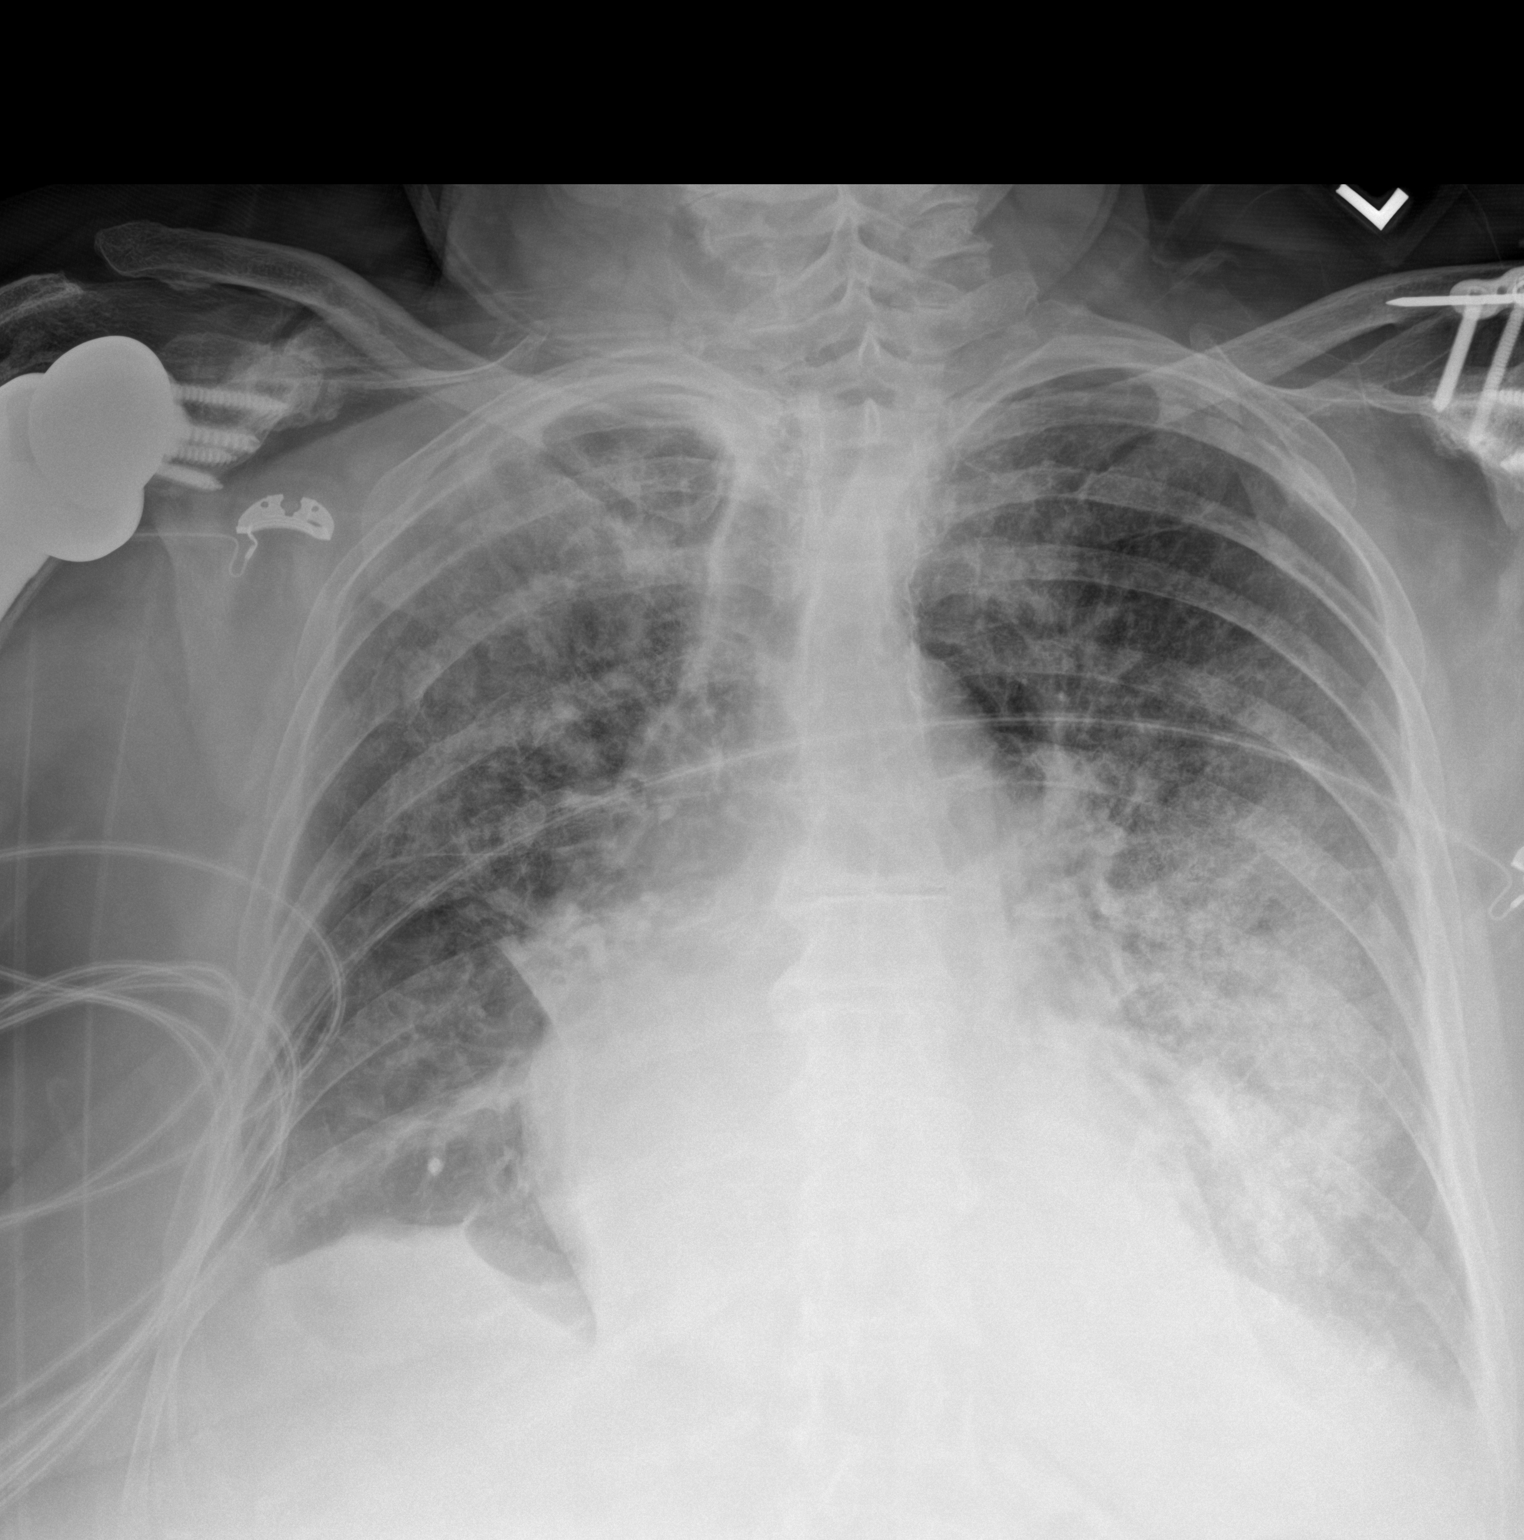

[1 of 1 positions shown; findings below may reference images not displayed]

FINDINGS: Stable cardiomegaly. Dense left perihilar airspace opacity, similar
to slightly improved compared to the previous study. Diffuse
bilateral interstitial thickening. No large pleural fluid
collection. No pneumothorax. Chronic bilateral rib deformities.
IMPRESSION: Persistent dense left perihilar airspace opacity, similar to
slightly improved compared to the previous study.

## 2022-05-28 MED ORDER — BREZTRI AEROSPHERE 160-9-4.8 MCG/ACT IN AERO: 2.0000 | INHALATION_SPRAY | Freq: Two times a day (BID) | RESPIRATORY_TRACT | 0 refills | Status: DC

## 2022-05-28 NOTE — Patient Instructions (Signed)
We are giving you a trial of an inhaler called Breztri.  This is 2 puffs twice a day.  Make sure that you rinse your mouth well after you use it.  You may still use your albuterol rescue if you get short of breath.  Do not use the budesonide (Pulmicort) while on the [place].  You may use the DuoNeb only if needed while on the [place].  We have sent in another request for [employer] to evaluate you for a portable oxygen concentrator.  If they cannot provide 1 your best bet would be to call [employer] and inquire how to get 1.  We will see you in follow-up in 2 to 3 months time call sooner should any new problems arise.

## 2022-05-28 NOTE — Progress Notes (Signed)
Subjective:    Patient ID: Kerri Carter, female    DOB: [birth year], 82 y.o.   MRN: [id number] Patient Care Team: Dana Allan, MD as PCP - General (Family Medicine) Otho Ket, RN as [facility] Care Management Salena Saner, MD as Consulting Physician (Pulmonary Disease)  Chief Complaint  Patient presents with   Follow-up    SOB with exertion. Occasional wheezing. Dry cough.    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  This is a scheduled visit.  We last evaluated her on 26 February 2022 at that time she was instructed to continue DuoNebs 4 times a day and Pulmicort twice a day.  Since that visit the patient was admitted to [facility] from 28 March 2022 through 08 April 2022.  She had sepsis due to pneumonia, acute exacerbation of COPD, nonsustained ventricular tachycardia and demand schemia leading to the compensation of chronic heart failure with preserved ejection fraction.  She eventually recovered and was able to be discharged to home on her baseline 3 L of oxygen.  She continues to have issues with dyspnea which are chronic for her no change in the pattern.  She cites compliance with DuoNeb is only using it twice a day and Pulmicort twice a day.  She however would like to try inhaler medications.  He had not been able to tolerate or afford Trelegy previously, she would like to try [facility] however I reminded her that she has had difficulties with MDIs due to her arthritis but she would still like to try this.  She had a 2D echo while she was hospitalized on 30 March 2022 that showed EF of 55 to 60% with grade 2 diastolic dysfunction.  Her right ventricular systolic function was normal.  She had moderate mitral valve regurgitation, mild to moderate aortic valve regurgitation and mild to moderate aortic stenosis.  She has not followed up with cardiology and was  reminded that she should make an appointment for follow-up.    Since her hospital discharge she has not had any fevers, chills or sweats.  Cough has been nonproductive with rare production of greenish sputum on occasion, this is baseline.  No hemoptysis.  He notes occasional wheezing.  No chest pain, orthopnea or paroxysmal nocturnal dyspnea.  She is compliant with oxygen at 3 L/min 24/7.  She became oxygen dependent completely after her April admission at [facility].  She does not endorse any other symptomatology today.   Review of Systems A 10 point review of systems was performed and it is as noted above otherwise negative.  Patient Active Problem List   Diagnosis Date Noted   Oral candida 05/17/2022   Hyperparathyroidism due to renal insufficiency 04/23/2022   Obesity (BMI 30-39.9) 03/31/2022   NSVT (nonsustained ventricular tachycardia) 03/30/2022   Acute urinary retention 03/30/2022   Sepsis due to pneumonia 03/28/2022   Demand ischemia 03/28/2022   History of major orthopedic surgery 03/19/2022   Iron deficiency anemia due to chronic blood loss 10/09/2021   Chronic heart failure with preserved ejection fraction 09/24/2021   Acute on chronic respiratory failure with hypoxia and hypercapnia 09/10/2021   Acute on chronic diastolic CHF (congestive heart failure) 08/20/2021   Paroxysmal atrial fibrillation 07/29/2021   History of lumbar fusion 05/23/2021   Impaired mobility 12/18/2020   Urinary incontinence 12/18/2020   Lumbar radiculopathy 12/11/2020   Chronic pain of  both shoulders 08/29/2019   Hypertriglyceridemia 07/24/2019   Bipolar disorder, in full remission, most recent episode depressed 07/13/2019   Essential hypertension 06/16/2019   Chronic pain 06/16/2019   Lymphedema 06/05/2019   Chronic venous insufficiency 05/25/2019   PAD (peripheral artery disease) 05/25/2019   Interstitial pulmonary disease 07/07/2018   Leukocytosis 10/19/2017   Allergic rhinitis 09/28/2017    Age-related osteoporosis without current pathological fracture 11/05/2016   Peripheral neuropathy 10/28/2016   Osteoporosis 08/27/2016   Drug-induced osteoporosis 08/27/2016   Stage 3b chronic kidney disease 08/25/2016   Adnexal mass 08/25/2016   Prediabetes 08/25/2016   Coronary atherosclerosis 08/25/2016   Hyperlipidemia 07/17/2015   Macular degeneration 07/17/2015   Rheumatoid arthritis 12/05/2014   Osteoarthritis of knee 06/07/2013   Valvular heart disease 10/13/2011   COPD with acute exacerbation 09/09/2011   OSA (obstructive sleep apnea) 09/09/2011   Gastroesophageal reflux disease 02/24/2011   Acquired absence of kidney 02/24/2011   Renal artery stenosis 02/24/2011   Social History   Tobacco Use   Smoking status: Former    Packs/day: 0.50    Years: 20.00    Additional pack years: 0.00    Total pack years: 10.00    Types: Cigarettes    Quit date: 02/02/1974    Years since quitting: 48.3   Smokeless tobacco: Never  Substance Use Topics   Alcohol use: No   Allergies  Allergen Reactions   Ceftin [Cefuroxime Axetil] Anaphylaxis   Lisinopril Anaphylaxis   Sulfa Antibiotics Other (See Comments)    Face swelling   Sulfasalazine Anaphylaxis   Morphine Other (See Comments)    Per patient, low blood pressure issues that requires action to raise it back up. Can take small infrequent doses   Xarelto [Rivaroxaban] Other (See Comments)    Stomach burning, bleeding, and tar in stool   Adhesive [Tape] Rash    Paper tape and tega derm OK   Antihistamines, Chlorpheniramine-Type Other (See Comments)    Makes pt hyper   Antivert [Meclizine Hcl] Other (See Comments)    Bladder will not empty   Aspirin Other (See Comments)    Sulfasalazine allergy cross reacts   Contrast Media [Iodinated Contrast Media] Rash    she is able to use betadine scrubs.   Decongestant [Pseudoephedrine Hcl] Other (See Comments)    Makes pt hyper   Doxycycline Other (See Comments)    GI upset    Levaquin [Levofloxacin In D5w] Rash   Polymyxin B Other (See Comments)    Facial rash   Tetanus Toxoids Rash and Other (See Comments)    Fever and hot to touch at injection site   Current Meds  Medication Sig   albuterol (PROVENTIL) (2.5 MG/3ML) 0.083% nebulizer solution Take by nebulization.   albuterol (VENTOLIN HFA) 108 (90 Base) MCG/ACT inhaler Inhale 2 puffs into the lungs every 6 (six) hours as needed for wheezing or shortness of breath.   amLODipine (NORVASC) 5 MG tablet Take 1 tablet (5 mg total) by mouth daily.   apixaban (ELIQUIS) 5 MG TABS tablet Take 1 tablet (5 mg total) by mouth 2 (two) times daily.   Budeson-Glycopyrrol-Formoterol (BREZTRI AEROSPHERE) 160-9-4.8 MCG/ACT AERO Inhale 2 puffs into the lungs in the morning and at bedtime.   budesonide (PULMICORT) 0.5 MG/2ML nebulizer solution Take 2 mLs (0.5 mg total) by nebulization 2 (two) times daily.   busPIRone (BUSPAR) 5 MG tablet Take 1 tablet (5 mg total) by mouth 2 (two) times daily.   cetirizine (ZYRTEC ALLERGY) 10 MG tablet  Take 1 tablet (10 mg total) by mouth at bedtime.   Cholecalciferol (VITAMIN D3) 50 MCG (2000 UT) TABS Take 4,000 Units by mouth daily.   dicyclomine (BENTYL) 10 MG capsule TAKE 1 CAPSULE (10 MG TOTAL) BY MOUTH 2 (TWO) TIMES DAILY AS NEEDED FOR SPASMS.   ENBREL SURECLICK 50 MG/ML injection Inject 50 mg into the skin once a week.   escitalopram (LEXAPRO) 10 MG tablet Take 1 tablet (10 mg total) by mouth daily.   ferrous sulfate 325 (65 FE) MG EC tablet Take 1 tablet (325 mg total) by mouth 2 (two) times daily. (Patient taking differently: Take 325 mg by mouth 2 (two) times daily. Pt reports MD instructed her to take twice a day every other day.)   gabapentin (NEURONTIN) 300 MG capsule Take 300 mg in the morning and 600 mg at night.   ipratropium-albuterol (DUONEB) 0.5-2.5 (3) MG/3ML SOLN SMARTSIG:3 Milliliter(s) Via Nebulizer Every 6 Hours PRN   lamoTRIgine (LAMICTAL) 100 MG tablet TAKE 1 TAB 2 TIMES  DAILY. FURTHER REFILLS NEW PSYCH FOR ALL PSYCH MEDS ONLY TEMP SUPPLY FROM PCP   leflunomide (ARAVA) 20 MG tablet Take 1 tablet (20 mg total) by mouth daily.   lidocaine (LIDODERM) 5 % Place 1 patch onto the skin 2 (two) times daily as needed. Remove & Discard patch within 12 hours or as directed by MD (Patient taking differently: Place 1 patch onto the skin daily. Remove & Discard patch within 12 hours or as directed by MD)   lovastatin (MEVACOR) 20 MG tablet TAKE 1 TABLET BY MOUTH EVERYDAY AT BEDTIME *STOP TALKING 40MG * (Patient taking differently: Take 20 mg by mouth at bedtime.)   methocarbamol (ROBAXIN) 500 MG tablet Take 500 mg by mouth 4 (four) times daily as needed. Taking twice a day currently   mometasone (NASONEX) 50 MCG/ACT nasal spray 1 spray to each nostril twice a day   montelukast (SINGULAIR) 10 MG tablet TAKE 1 TABLET BY MOUTH EVERY DAY (Patient taking differently: Take 10 mg by mouth daily.)   multivitamin-lutein (OCUVITE-LUTEIN) CAPS capsule Take 1 capsule by mouth at bedtime.   nebivolol (BYSTOLIC) 5 MG tablet Take 1 tablet (5 mg total) by mouth in the morning and at bedtime. (Note dose changed from 1/2 10 mg bid to 5 mg bid)   OXYGEN Inhale 3 L into the lungs continuous.   pantoprazole (PROTONIX) 40 MG tablet TAKE 1 TABLET BY MOUTH 2 TIMES DAILY 30 MIN BEFORE FOOD (NOTE REDUCTION IN FREQUENCY)   predniSONE (DELTASONE) 5 MG tablet TAKE 1 TABLET BY MOUTH EVERY DAY WITH BREAKFAST   PROLIA 60 MG/ML SOSY injection Inject 60 mg into the skin every 6 (six) months.   QUEtiapine (SEROQUEL) 25 MG tablet TAKE 1 TABLET (25 MG TOTAL) BY MOUTH AT BEDTIME. AGAIN LAST FILL FURTHER REFILLS FROM PSYCHIATRY NO EXCEPTIONS   sucralfate (CARAFATE) 1 g tablet TAKE 1 TABLET BY MOUTH 2 TIMES DAILY.   torsemide (DEMADEX) 20 MG tablet Take 1 tablet (20 mg total) by mouth daily.   traMADol (ULTRAM) 50 MG tablet Take 1 tablet (50 mg total) by mouth 2 (two) times daily.   Vibegron (GEMTESA) 75 MG TABS Take 75  mg by mouth daily.   Immunization History  Administered Date(s) Administered   Fluad Quad(high Dose 65+) 10/03/2018, 10/16/2019, 11/22/2020, 10/21/2021   Influenza Split 10/04/2012, 10/25/2013, 10/28/2016   Influenza Whole 11/03/2011   Influenza, High Dose Seasonal PF 10/04/2015, 11/03/2016   Influenza,inj,Quad PF,6+ Mos 10/31/2014   Influenza-Unspecified 01/02/2010, 11/24/2011, 10/21/2013,  10/31/2014, 09/28/2017   Pneumococcal Conjugate-13 10/21/2013   Pneumococcal Polysaccharide-23 11/07/2015   Respiratory Syncytial Virus Vaccine,Recomb Aduvanted(Arexvy) 01/12/2022       Objective:   Physical Exam BP 124/82 (BP Location: Right Arm, Cuff Size: Normal)   Pulse 84   Temp (!) 97.5 F (36.4 C)   Ht 4' 11.5" (1.511 m)   Wt 171 lb (77.6 kg) Comment: per patient.  SpO2 98%   BMI 33.96 kg/m   SpO2: 98 % O2 Device: Nasal cannula O2 Flow Rate (L/min): 3 L/min O2 Type: Continuous O2  GENERAL: Well-developed, well-nourished elderly woman, she presents ambulatory with assistance of a walker.   HEAD: Normocephalic, atraumatic. EYES: Pupils equal, round, reactive to light.  No scleral icterus. MOUTH: Oral mucosa moist.  No thrush. NECK: Supple. No thyromegaly. Trachea midline. No JVD.  No adenopathy. PULMONARY: Coarse breath sounds throughout, faint end expiratory wheezes noted.  CARDIOVASCULAR: S1 and S2. Regular rate and rhythm.  Grade 3/6 systolic ejection murmur left sternal border, seems more prominent than on prior exam. GASTROINTESTINAL: Benign MUSCULOSKELETAL: Moderate changes of RA both hands, no clubbing, no edema.  Limited range of motion of joints due to RA. NEUROLOGIC: No overt focal deficits.  Awake, alert, speech is fluent. SKIN: Intact,warm,dry. PSYCH: Mood and behavior appropriate   Lab Results  Component Value Date   NITRICOXIDE 33 01/01/2022       Assessment & Plan:     ICD-10-CM   1. Asthma-COPD overlap syndrome  J44.89 AMB REFERRAL FOR DME   Will give  the patient a trial of Breztri 2 puffs twice daily Samples provided for the patient    2. Chronic respiratory failure with hypoxia (HCC)  J96.11 AMB REFERRAL FOR DME   Continue supplemental oxygen Referral to [facility] for POC    3. Mitral and aortic valve regurgitation  I08.0    This issue adds complexity to her management Follows with cardiology Challenges volume status    4. NSIP (nonspecific interstitial pneumonitis) (HCC)  J84.89    Related to rheumatoid arthritis Does not appear to be progressive    5. Rheumatoid arthritis involving multiple sites with positive rheumatoid factor (HCC)  M05.79    This issue adds complexity to her management Relatively immunocompromised due to this     Orders Placed This Encounter  Procedures   AMB REFERRAL FOR DME    Referral Priority:   Routine    Referral Type:   Durable Medical Equipment Purchase    Number of Visits Requested:   1   Meds ordered this encounter  Medications   Budeson-Glycopyrrol-Formoterol (BREZTRI AEROSPHERE) 160-9-4.8 MCG/ACT AERO    Sig: Inhale 2 puffs into the lungs in the morning and at bedtime.    Dispense:  11.8 g    Refill:  0   We provided the patient with instruction and samples of Breztri.  She is to let us know if this is effective.  She was instructed to discontinue budesonide and only use albuterol as rescue if needed.  No DuoNeb except for only as needed.  Will see her in follow-up in 2 to 3 months time she is to contact us prior to that time should any new difficulties arise.  Gailen Shelter, MD Advanced Bronchoscopy PCC[facility]    *This note was dictated using voice recognition software/Dragon.  Despite best efforts to proofread, errors can occur which can change the meaning. Any transcriptional errors that result from this process are unintentional and may not be fully corrected  at the time of dictation.

## 2022-05-30 ENCOUNTER — Encounter: Payer: Self-pay | Admitting: Pulmonary Disease

## 2022-05-30 ENCOUNTER — Encounter: Payer: Self-pay | Admitting: Family Medicine

## 2022-05-30 NOTE — Assessment & Plan Note (Signed)
Chronic.  Stable. Continue Norvasc 5 mg daily Continue torsemide 20 mg daily Continue Bystolic 5 mg twice daily Follow-up as needed.

## 2022-05-30 NOTE — Assessment & Plan Note (Signed)
Resolved

## 2022-06-01 ENCOUNTER — Telehealth: Payer: Self-pay | Admitting: Pulmonary Disease

## 2022-06-01 DIAGNOSIS — J9621 Acute and chronic respiratory failure with hypoxia: Secondary | ICD-10-CM | POA: Diagnosis not present

## 2022-06-01 DIAGNOSIS — D509 Iron deficiency anemia, unspecified: Secondary | ICD-10-CM | POA: Diagnosis not present

## 2022-06-01 DIAGNOSIS — G4733 Obstructive sleep apnea (adult) (pediatric): Secondary | ICD-10-CM | POA: Diagnosis not present

## 2022-06-01 DIAGNOSIS — N1832 Chronic kidney disease, stage 3b: Secondary | ICD-10-CM | POA: Diagnosis not present

## 2022-06-01 DIAGNOSIS — F319 Bipolar disorder, unspecified: Secondary | ICD-10-CM | POA: Diagnosis not present

## 2022-06-01 DIAGNOSIS — I130 Hypertensive heart and chronic kidney disease with heart failure and stage 1 through stage 4 chronic kidney disease, or unspecified chronic kidney disease: Secondary | ICD-10-CM | POA: Diagnosis not present

## 2022-06-01 DIAGNOSIS — J441 Chronic obstructive pulmonary disease with (acute) exacerbation: Secondary | ICD-10-CM | POA: Diagnosis not present

## 2022-06-01 DIAGNOSIS — I5042 Chronic combined systolic (congestive) and diastolic (congestive) heart failure: Secondary | ICD-10-CM | POA: Diagnosis not present

## 2022-06-01 DIAGNOSIS — D631 Anemia in chronic kidney disease: Secondary | ICD-10-CM | POA: Diagnosis not present

## 2022-06-01 NOTE — Telephone Encounter (Signed)
PT states the [facility] issued by Dr. Reece Agar on her last appt  is "making her head swim". Please call to advise.  PT is @ [phone]  Pharm is: [facility] in [place]

## 2022-06-03 ENCOUNTER — Other Ambulatory Visit (HOSPITAL_COMMUNITY): Payer: Self-pay

## 2022-06-03 ENCOUNTER — Ambulatory Visit: Payer: Medicare PPO | Attending: Family | Admitting: Family

## 2022-06-03 ENCOUNTER — Encounter: Payer: Self-pay | Admitting: Family

## 2022-06-03 ENCOUNTER — Encounter: Payer: Self-pay | Admitting: Oncology

## 2022-06-03 VITALS — BP 120/60 | HR 79 | Wt 171.4 lb

## 2022-06-03 DIAGNOSIS — D631 Anemia in chronic kidney disease: Secondary | ICD-10-CM | POA: Diagnosis not present

## 2022-06-03 DIAGNOSIS — D500 Iron deficiency anemia secondary to blood loss (chronic): Secondary | ICD-10-CM | POA: Diagnosis not present

## 2022-06-03 DIAGNOSIS — I130 Hypertensive heart and chronic kidney disease with heart failure and stage 1 through stage 4 chronic kidney disease, or unspecified chronic kidney disease: Secondary | ICD-10-CM | POA: Insufficient documentation

## 2022-06-03 DIAGNOSIS — E785 Hyperlipidemia, unspecified: Secondary | ICD-10-CM | POA: Insufficient documentation

## 2022-06-03 DIAGNOSIS — K219 Gastro-esophageal reflux disease without esophagitis: Secondary | ICD-10-CM | POA: Diagnosis not present

## 2022-06-03 DIAGNOSIS — I480 Paroxysmal atrial fibrillation: Secondary | ICD-10-CM | POA: Diagnosis not present

## 2022-06-03 DIAGNOSIS — I5042 Chronic combined systolic (congestive) and diastolic (congestive) heart failure: Secondary | ICD-10-CM | POA: Insufficient documentation

## 2022-06-03 DIAGNOSIS — N1831 Chronic kidney disease, stage 3a: Secondary | ICD-10-CM | POA: Diagnosis not present

## 2022-06-03 DIAGNOSIS — I38 Endocarditis, valve unspecified: Secondary | ICD-10-CM | POA: Diagnosis not present

## 2022-06-03 DIAGNOSIS — I350 Nonrheumatic aortic (valve) stenosis: Secondary | ICD-10-CM | POA: Diagnosis not present

## 2022-06-03 DIAGNOSIS — I10 Essential (primary) hypertension: Secondary | ICD-10-CM

## 2022-06-03 DIAGNOSIS — I428 Other cardiomyopathies: Secondary | ICD-10-CM | POA: Insufficient documentation

## 2022-06-03 DIAGNOSIS — I080 Rheumatic disorders of both mitral and aortic valves: Secondary | ICD-10-CM | POA: Diagnosis not present

## 2022-06-03 DIAGNOSIS — I5032 Chronic diastolic (congestive) heart failure: Secondary | ICD-10-CM | POA: Diagnosis not present

## 2022-06-03 DIAGNOSIS — I2510 Atherosclerotic heart disease of native coronary artery without angina pectoris: Secondary | ICD-10-CM | POA: Diagnosis not present

## 2022-06-03 DIAGNOSIS — Z7951 Long term (current) use of inhaled steroids: Secondary | ICD-10-CM | POA: Diagnosis not present

## 2022-06-03 DIAGNOSIS — Z87891 Personal history of nicotine dependence: Secondary | ICD-10-CM | POA: Diagnosis not present

## 2022-06-03 DIAGNOSIS — F419 Anxiety disorder, unspecified: Secondary | ICD-10-CM | POA: Diagnosis not present

## 2022-06-03 DIAGNOSIS — Z7952 Long term (current) use of systemic steroids: Secondary | ICD-10-CM | POA: Insufficient documentation

## 2022-06-03 DIAGNOSIS — J849 Interstitial pulmonary disease, unspecified: Secondary | ICD-10-CM | POA: Insufficient documentation

## 2022-06-03 DIAGNOSIS — J449 Chronic obstructive pulmonary disease, unspecified: Secondary | ICD-10-CM

## 2022-06-03 DIAGNOSIS — Z79899 Other long term (current) drug therapy: Secondary | ICD-10-CM | POA: Insufficient documentation

## 2022-06-03 DIAGNOSIS — J4489 Other specified chronic obstructive pulmonary disease: Secondary | ICD-10-CM | POA: Insufficient documentation

## 2022-06-03 NOTE — Patient Instructions (Addendum)
Take an extra torsemide today and then take 2 in the morning.

## 2022-06-03 NOTE — Progress Notes (Signed)
Patient ID: Kerri Carter, female    DOB: 13-Jun-1940, 82 y.o.   MRN: [id number]  Primary cardiologist: Fredirick Lathe, MD (last seen 11/23; returns 09/24) PCP: Dana Allan, MD (last seen 04/24)  HPI  Ms Kerri Carter is a 82 y/o female with a history of asthma, PAF, carotid disease, CAD, hyperlipidemia, HTN, CKD, anxiety, aortic stenosis, bipolar, conversion disorder, depression, GERD, ILD, macular degeneration, PAD, previous tobacco use and chronic heart failure.   Echo 03/30/22: EF 55-60% with mild/ moderate MR/ AR and mild/ moderate AS. Echo 07/29/21: EF of 55-60% along with moderate/severe MR but no LVH.   Admitted 03/28/22 due to SOB due to pneumonia and COPD. Antibiotics provided.   She presents today for a HF follow-up visit with a chief complaint of moderate SOB with minimal exertion. Chronic in nature although does report feeling like her breathing has worsened over the last several days. Has associated fatigue, cough, pedal edema (R>L) & easy bruising along with this. Denies difficulty sleeping, abdominal distention, palpitations, chest pain, wheezing, dizziness or weight gain.   Was eating cheetos but realized how high in sodium these were so snacks on pork rinds instead. Understands these still have sodium but it's less than the cheetos.   She was started on breztri by pulmonology a few days ago but says that it made her swimmy headed so she stopped taking it and has resumed her nebulizer treatments and the swimmy head has since resolved.   Past Medical History:  Diagnosis Date   Acromial process of scapula fracture 12/21/2019   Acute kidney injury superimposed on chronic kidney disease (HCC) 05/26/2021   Acute on chronic combined systolic (congestive) and diastolic (congestive) heart failure (HCC) 07/28/2021   Acute on chronic combined systolic and diastolic CHF (congestive heart failure) (HCC) 05/26/2021   Acute on chronic respiratory failure with hypoxia (HCC) 09/10/2021   Acute pain  of left knee 12/18/2020   Anemia in stage 3a chronic kidney disease (HCC) 10/09/2021   Anxiety    Anxiety 01/20/2019   Anxiety and depression 05/26/2021   Aortic stenosis 07/09/2015   a.) TTE 07/06/2015: EF 55-60%; mild AS with MPG 13.0 mmHg.   Arrhythmia    atrial fibrillation   Assistance needed for mobility 12/18/2020   Asthma    At high risk for falls 03/16/2019   Avascular necrosis of left shoulder due to adverse effect of steroid therapy (HCC) 01/20/2018   Bipolar disorder (HCC)    C. difficile diarrhea 2010   a.) following ABX course during hospital admission   Carotid atherosclerosis, bilateral    a.) Moderate; < 50% stenosis BILATERAL ICAs.   Cataract    a.) s/p BILATERAL extraction   CHF (congestive heart failure) (HCC)    Chicken pox    Chronic left shoulder pain 08/29/2019   Chronic respiratory failure with hypoxia (HCC) 10/01/2021   CKD (chronic kidney disease), stage III (HCC)    a. s/p R nephrectomy./ aneurysm   Closed fracture of acromial process of scapula 10/28/2018   Closed nondisplaced fracture of proximal phalanx of lesser toe of right foot    Collapsed vertebra, not elsewhere classified, lumbar region, initial encounter for fracture (HCC) 02/21/2019   Community acquired pneumonia 07/22/2021   Complicated grief 03/16/2019   Compression fracture of L4 vertebra (HCC) 02/21/2019   Conversion disorder    Conversion disorder 08/04/2015   COPD (chronic obstructive pulmonary disease) (HCC)    COPD with exacerbation (HCC) 08/20/2021   Cough 05/02/2012   Depression  Dislocation of hip joint prosthesis (HCC) 01/08/2017   Elevated brain natriuretic peptide (BNP) level 12/18/2020   Elevated d-dimer 05/26/2021   Episodic mood disorder (HCC) 04/18/2019   Essential hypertension    Fatigue 07/07/2018   Frequent falls 12/18/2020   GERD (gastroesophageal reflux disease)    H/O cardiac catheterization 02/24/2011   Formatting of this note might be different from the  original.  Repeated at [facility] 2/13 and insignificant disease   Hav (hallux abducto valgus), unspecified laterality 12/05/2018   Heart murmur    Hip fracture (HCC) 10/31/2016   Hip pain 12/18/2020   History of fracture of left hip 01/15/2017   History of syncope 08/25/2016   Hyperkalemia 05/26/2021   - The patient was given p.o. Lokelma and IV Lasix.  - We will follow potassium level.   Hyperlipidemia    Hypokalemia 08/25/2016   ILD (interstitial lung disease) (HCC)    mild; 2/2 RA diagnosis   Iliopsoas bursitis of right hip 08/29/2019   Inflammatory arthritis    a. hands/carpal tunnel.  b. Low titer rheumatoid factor. c. Negative anti-CCP antibodies. d. Plaquenil.   Insomnia 07/17/2015   Left foot pain 10/19/2016   Left lower lobe pneumonia 05/26/2021   Leg edema 05/02/2019   Leg pain, bilateral    Leg swelling 12/15/2016   Leukocytosis 10/19/2017   Leukopenia 06/24/2021   Lymphocytosis 07/13/2021   Macular degeneration    Microcytic anemia 08/15/2015   Mixed bipolar I disorder (HCC) 10/09/2015   Nocturnal hypoxemia    Nocturnal hypoxemia 09/09/2011   June 2013 overnight oximetry on 6 centimeters of water CPAP: SpO2 less than 88% 72% of the time   Non-Obstructive CAD    a. 07/2009 Cath ([facility]): nonobs dzs;  b. 03/2011 Cath [facility]): nonobs dzs.   NSIP (nonspecific interstitial pneumonitis) (HCC) 12/08/2019   Osteoarthritis    a. Knees.   Other shoulder lesions, left shoulder 12/20/2017   Other specified postprocedural states 07/20/2013   Overweight (BMI 25.0-29.9) 08/29/2019   PAD (peripheral artery disease) (HCC)    Pain due to onychomycosis of toenails of both feet 12/05/2018   Pelvic mass in female 03/16/2019   02/21/19 CT pelvis w/o contrast  Cystic left adnexal lesion has enlarged since 2018 where it  measured approximately 1.8 x 1.7 cm. This is not well assessed due  to streak artifact as well as lack of contrast. Ultrasound follow-up  may be helpful in 8-12 weeks       04/05/19 TVUS     IMPRESSION:  Simple appearing cystic structure within the left ovary measuring up  to 2.9 cm. While this has enlarged s   Physical deconditioning 02/27/2021   Pleural effusion on left 12/18/2020   PUD (peptic ulcer disease)    Recurrent falls 09/01/2021   Recurrent pneumonia 07/23/2021   Repeated falls 12/18/2020   Respiratory failure with hypoxia (HCC) 01/12/2021   Rotator cuff tendinitis, right 07/26/2017   S/P lumbar fusion 05/23/2021   S/P right hip fracture    11/01/16 s/p repair   Sepsis due to pneumonia (HCC) 07/22/2021   Shoulder pain    Sleep apnea    no cpap / minimal   SOB (shortness of breath) 10/13/2011   2011 [facility] eval >> LHC (non-obstructive CAD), RHC (wedge 13, mean PA 26), TTE (LVEF > 55%, mild concentric LVH, mild AR, MR, PR, TR), CTAngio chest unrevealing; "well controlled asthma"  02/2011 TTE [facility]>> NL LVEF, Mild LVH, Mod AR/Mod MR   Spinal  stenosis at L4-L5 level    severe with L4/L5 anterolisthesis grade 1 anterolisthesis    Spinal stenosis of lumbar region 09/15/2016   Last Assessment & Plan:   Now post op with continued back and leg pain noted. Is working with pt/ot and surgery was said to have no complications. Bowels are moving and general pain and anxiety is basically baseline.    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   Weakness of both lower extremities 12/18/2020   Past Surgical History:  Procedure  Laterality Date   APPENDECTOMY     APPLICATION OF INTRAOPERATIVE CT SCAN N/A 05/23/2021   Procedure: APPLICATION OF INTRAOPERATIVE CT SCAN;  Surgeon: Venetia Night, MD;  Location: [facility];  Service: Neurosurgery;  Laterality: N/A;   BACK SURGERY     lumbar fusion   BUNIONECTOMY Right    CATARACT EXTRACTION, BILATERAL     CESAREAN SECTION     x1   CHOLECYSTECTOMY N/A 05/11/2016   Procedure: LAPAROSCOPIC CHOLECYSTECTOMY;  Surgeon: Lattie Haw, MD;  Location: [facility];  Service: General;  Laterality: N/A;   COLONOSCOPY WITH PROPOFOL N/A 04/02/2016   Procedure: COLONOSCOPY WITH PROPOFOL;  Surgeon: Wyline Mood, MD;  Location: [facility];  Service: Endoscopy;  Laterality: N/A;   ENDOSCOPIC RETROGRADE CHOLANGIOPANCREATOGRAPHY (ERCP) WITH PROPOFOL N/A 05/08/2016   Procedure: ENDOSCOPIC RETROGRADE CHOLANGIOPANCREATOGRAPHY (ERCP) WITH PROPOFOL;  Surgeon: Midge Minium, MD;  Location: [facility];  Service: Endoscopy;  Laterality: N/A;   ERCP     with biliary spincterotomy 05/08/16 Dr. Servando Snare for choledocholithiasis    ESOPHAGEAL DILATION  04/02/2016   Procedure: ESOPHAGEAL DILATION;  Surgeon: Wyline Mood, MD;  Location: [facility];  Service: Endoscopy;;   ESOPHAGOGASTRODUODENOSCOPY (EGD) WITH PROPOFOL N/A 04/02/2016   Procedure: ESOPHAGOGASTRODUODENOSCOPY (EGD) WITH PROPOFOL;  Surgeon: Wyline Mood, MD;  Location: [facility];  Service: Endoscopy;  Laterality: N/A;   HIP ARTHROPLASTY Right 11/01/2016   Procedure: ARTHROPLASTY BIPOLAR HIP (HEMIARTHROPLASTY);  Surgeon: Christena Flake, MD;  Location: [facility];  Service: Orthopedics;  Laterality: Right;   LUMBAR LAMINECTOMY/DECOMPRESSION MICRODISCECTOMY N/A 12/11/2020   Procedure: LEFT L2-3 MICRODISCECTOMY, L3-4 AND L5-S1 DECOMPRESSION;  Surgeon: Venetia Night, MD;  Location: [facility];  Service: Neurosurgery;  Laterality: N/A;   NEPHRECTOMY  1988   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   ORIF SCAPHOID FRACTURE Left  12/21/2019   Procedure: OPEN REDUCTION INTERNAL FIXATION (ORIF) OF LEFT SCAPULAR NONUNION WITH BONE GRAFT;  Surgeon: Christena Flake, MD;  Location: [facility];  Service: Orthopedics;  Laterality: Left;   osteoporosis     noted DEXA 08/19/16    REPLACEMENT TOTAL KNEE Right    REVERSE SHOULDER ARTHROPLASTY Right 11/04/2017   Procedure: REVERSE SHOULDER ARTHROPLASTY;  Surgeon: Christena Flake, MD;  Location: [facility];  Service: Orthopedics;  Laterality: Right;   REVERSE SHOULDER ARTHROPLASTY Left 07/26/2018   Procedure: REVERSE SHOULDER ARTHROPLASTY;  Surgeon: Christena Flake, MD;  Location: [facility];  Service: Orthopedics;  Laterality: Left;  TONSILLECTOMY     TOTAL HIP ARTHROPLASTY  12/10/11   [facility] left hip   TOTAL HIP ARTHROPLASTY Bilateral    TRANSFORAMINAL LUMBAR INTERBODY FUSION (TLIF) WITH PEDICLE SCREW FIXATION 3 LEVEL N/A 05/23/2021   Procedure: OPEN L2-4 TRANSFORAMINAL LUMBAR INTERBODY FUSION (TLIF) WITH L2-5 POSTERIOR SPINAL FUSION;  Surgeon: Venetia Night, MD;  Location: [facility];  Service: Neurosurgery;  Laterality: N/A;   TUBAL LIGATION     Family History  Problem Relation Age of Onset   Rheum arthritis Mother    Asthma Mother    Parkinson's disease Mother    Heart disease Mother    Stroke Mother    Hypertension Mother    Heart attack Father    Heart disease Father    Hypertension Father    Peripheral Artery Disease Father    Diabetes Son    Gout Son    Asthma Sister    Heart disease Sister    Lung cancer Sister    Heart disease Sister    Heart disease Sister    Breast cancer Sister    Heart attack Sister    Heart disease Brother    Heart disease Maternal Grandmother    Diabetes Maternal Grandmother    Colon cancer Maternal Grandmother    Cancer Maternal Grandmother        Hodgkins lymphoma   Heart disease Brother    Alcohol abuse Brother    Depression Brother    Dementia Son    Social History   Tobacco Use   Smoking status: Former    Packs/day: 0.50     Years: 20.00    Additional pack years: 0.00    Total pack years: 10.00    Types: Cigarettes    Quit date: 02/02/1974    Years since quitting: 48.3   Smokeless tobacco: Never  Substance Use Topics   Alcohol use: No   Allergies  Allergen Reactions   Ceftin [Cefuroxime Axetil] Anaphylaxis   Lisinopril Anaphylaxis   Sulfa Antibiotics Other (See Comments)    Face swelling   Sulfasalazine Anaphylaxis   Morphine Other (See Comments)    Per patient, low blood pressure issues that requires action to raise it back up. Can take small infrequent doses   Xarelto [Rivaroxaban] Other (See Comments)    Stomach burning, bleeding, and tar in stool   Adhesive [Tape] Rash    Paper tape and tega derm OK   Antihistamines, Chlorpheniramine-Type Other (See Comments)    Makes pt hyper   Antivert [Meclizine Hcl] Other (See Comments)    Bladder will not empty   Aspirin Other (See Comments)    Sulfasalazine allergy cross reacts   Contrast Media [Iodinated Contrast Media] Rash    she is able to use betadine scrubs.   Decongestant [Pseudoephedrine Hcl] Other (See Comments)    Makes pt hyper   Doxycycline Other (See Comments)    GI upset   Levaquin [Levofloxacin In D5w] Rash   Polymyxin B Other (See Comments)    Facial rash   Tetanus Toxoids Rash and Other (See Comments)    Fever and hot to touch at injection site   Prior to Admission medications   Medication Sig Start Date End Date Taking? Authorizing Provider  albuterol (PROVENTIL) (2.5 MG/3ML) 0.083% nebulizer solution Take by nebulization. 12/08/21   [provider]  albuterol (VENTOLIN HFA) 108 (90 Base) MCG/ACT inhaler Inhale 2 puffs into the lungs every 6 (six) hours as needed for wheezing or shortness of breath. 06/24/21  McLean-Scocuzza, Pasty Spillers, MD  amLODipine (NORVASC) 5 MG tablet Take 1 tablet (5 mg total) by mouth daily. 08/01/21   McLean-Scocuzza, Pasty Spillers, MD  apixaban (ELIQUIS) 5 MG TABS tablet Take 1 tablet (5 mg total) by mouth  2 (two) times daily. 10/15/21   McLean-Scocuzza, Pasty Spillers, MD  Budeson-Glycopyrrol-Formoterol (BREZTRI AEROSPHERE) 160-9-4.8 MCG/ACT AERO Inhale 2 puffs into the lungs in the morning and at bedtime. 05/28/22   Salena Saner, MD  budesonide (PULMICORT) 0.5 MG/2ML nebulizer solution Take 2 mLs (0.5 mg total) by nebulization 2 (two) times daily. 01/01/22 01/01/23  Salena Saner, MD  busPIRone (BUSPAR) 5 MG tablet Take 1 tablet (5 mg total) by mouth 2 (two) times daily. 06/02/21   Lurene Shadow, MD  cetirizine (ZYRTEC ALLERGY) 10 MG tablet Take 1 tablet (10 mg total) by mouth at bedtime. 02/26/22   Salena Saner, MD  Cholecalciferol (VITAMIN D3) 50 MCG (2000 UT) TABS Take 4,000 Units by mouth daily. 08/01/21   McLean-Scocuzza, Pasty Spillers, MD  dicyclomine (BENTYL) 10 MG capsule TAKE 1 CAPSULE (10 MG TOTAL) BY MOUTH 2 (TWO) TIMES DAILY AS NEEDED FOR SPASMS. 11/03/21 10/29/22  Wyline Mood, MD  ENBREL SURECLICK 50 MG/ML injection Inject 50 mg into the skin once a week.    [provider]  escitalopram (LEXAPRO) 10 MG tablet Take 1 tablet (10 mg total) by mouth daily. 08/01/21   McLean-Scocuzza, Pasty Spillers, MD  ferrous sulfate 325 (65 FE) MG EC tablet Take 1 tablet (325 mg total) by mouth 2 (two) times daily. Patient taking differently: Take 325 mg by mouth 2 (two) times daily. Pt reports MD instructed her to take twice a day every other day. 09/12/21 09/12/22  DanfordEarl Lites, MD  gabapentin (NEURONTIN) 300 MG capsule Take 300 mg in the morning and 600 mg at night. 01/06/22   Dana Allan, MD  ipratropium-albuterol (DUONEB) 0.5-2.5 (3) MG/3ML SOLN SMARTSIG:3 Milliliter(s) Via Nebulizer Every 6 Hours PRN 03/25/22   [provider]  lamoTRIgine (LAMICTAL) 100 MG tablet TAKE 1 TAB 2 TIMES DAILY. FURTHER REFILLS NEW PSYCH FOR ALL PSYCH MEDS ONLY TEMP SUPPLY FROM PCP 05/02/21   McLean-Scocuzza, Pasty Spillers, MD  leflunomide (ARAVA) 20 MG tablet Take 1 tablet (20 mg total) by mouth daily. 09/30/17    McLean-Scocuzza, Pasty Spillers, MD  lidocaine (LIDODERM) 5 % Place 1 patch onto the skin 2 (two) times daily as needed. Remove & Discard patch within 12 hours or as directed by MD Patient taking differently: Place 1 patch onto the skin daily. Remove & Discard patch within 12 hours or as directed by MD 11/22/20   McLean-Scocuzza, Pasty Spillers, MD  lovastatin (MEVACOR) 20 MG tablet TAKE 1 TABLET BY MOUTH EVERYDAY AT BEDTIME *STOP TALKING 40MG * Patient taking differently: Take 20 mg by mouth at bedtime. 08/01/21   McLean-Scocuzza, Pasty Spillers, MD  methocarbamol (ROBAXIN) 500 MG tablet Take 500 mg by mouth 4 (four) times daily as needed. Taking twice a day currently 01/23/21   [provider]  mometasone (NASONEX) 50 MCG/ACT nasal spray 1 spray to each nostril twice a day 02/26/22   Salena Saner, MD  montelukast (SINGULAIR) 10 MG tablet TAKE 1 TABLET BY MOUTH EVERY DAY Patient taking differently: Take 10 mg by mouth daily. 03/24/21   McLean-Scocuzza, Pasty Spillers, MD  multivitamin-lutein [facility]) CAPS capsule Take 1 capsule by mouth at bedtime.    [provider]  nebivolol (BYSTOLIC) 5 MG tablet Take 1 tablet (5 mg total) by  mouth in the morning and at bedtime. (Note dose changed from 1/2 10 mg bid to 5 mg bid) 08/01/21   McLean-Scocuzza, Pasty Spillers, MD  OXYGEN Inhale 3 L into the lungs continuous.    [provider]  pantoprazole (PROTONIX) 40 MG tablet TAKE 1 TABLET BY MOUTH 2 TIMES DAILY 30 MIN BEFORE FOOD (NOTE REDUCTION IN FREQUENCY) 11/03/21   McLean-Scocuzza, Pasty Spillers, MD  predniSONE (DELTASONE) 5 MG tablet TAKE 1 TABLET BY MOUTH EVERY DAY WITH BREAKFAST 02/20/22   Salena Saner, MD  PROLIA 60 MG/ML SOSY injection Inject 60 mg into the skin every 6 (six) months. 03/09/22   [provider]  QUEtiapine (SEROQUEL) 25 MG tablet TAKE 1 TABLET (25 MG TOTAL) BY MOUTH AT BEDTIME. AGAIN LAST FILL FURTHER REFILLS FROM PSYCHIATRY NO EXCEPTIONS 09/13/20   McLean-Scocuzza, Pasty Spillers, MD   sucralfate (CARAFATE) 1 g tablet TAKE 1 TABLET BY MOUTH 2 TIMES DAILY. 11/12/21   Wyline Mood, MD  torsemide (DEMADEX) 20 MG tablet Take 1 tablet (20 mg total) by mouth daily. 09/12/21   Danford, Earl Lites, MD  traMADol (ULTRAM) 50 MG tablet Take 1 tablet (50 mg total) by mouth 2 (two) times daily. 04/08/22   Enedina Finner, MD  Vibegron (GEMTESA) 75 MG TABS Take 75 mg by mouth daily. 01/08/22   Carman Ching, PA-C    Review of Systems  Constitutional:  Positive for fatigue (easily). Negative for appetite change.  HENT:  Negative for congestion, postnasal drip and sore throat.   Eyes: Negative.   Respiratory:  Positive for cough (productive at times) and shortness of breath (easily). Negative for chest tightness and wheezing.   Cardiovascular:  Positive for leg swelling (R>L). Negative for chest pain and palpitations.  Gastrointestinal:  Negative for abdominal distention and abdominal pain.  Endocrine: Negative.   Genitourinary: Negative.   Musculoskeletal:  Negative for arthralgias, back pain and neck pain.  Skin: Negative.   Allergic/Immunologic: Negative.   Neurological:  Negative for dizziness and light-headedness.  Hematological:  Negative for adenopathy. Bruises/bleeds easily.  Psychiatric/Behavioral:  Negative for dysphoric mood and sleep disturbance (sleeping on 1 pillow with HOB elevated along with oxygen). The patient is not nervous/anxious.    Vitals:   06/03/22 1309  BP: 120/60  Pulse: 79  SpO2: 96%  Weight: 171 lb 6 oz (77.7 kg)   Wt Readings from Last 3 Encounters:  06/03/22 171 lb 6 oz (77.7 kg)  05/28/22 171 lb (77.6 kg)  05/21/22 170 lb 12.8 oz (77.5 kg)   Lab Results  Component Value Date   CREATININE 1.36 (H) 04/07/2022   CREATININE 1.23 (H) 04/05/2022   CREATININE 1.20 (H) 04/04/2022   Physical Exam Vitals and nursing note reviewed.  Constitutional:      Appearance: She is well-developed.  HENT:     Head: Normocephalic and atraumatic.  Neck:      Vascular: No JVD.  Cardiovascular:     Rate and Rhythm: Normal rate and regular rhythm.  Pulmonary:     Effort: Pulmonary effort is normal. No accessory muscle usage.     Breath sounds: Rhonchi (RLL) present. No wheezing or rales.  Abdominal:     Palpations: Abdomen is soft.     Tenderness: There is no abdominal tenderness.  Musculoskeletal:     Cervical back: Normal range of motion and neck supple.     Right lower leg: No tenderness. Edema (1+ pitting) present.     Left lower leg: No tenderness. Edema (trace pitting)  present.  Skin:    General: Skin is warm and dry.  Neurological:     General: No focal deficit present.     Mental Status: She is alert and oriented to person, place, and time.  Psychiatric:        Mood and Affect: Mood normal.        Behavior: Behavior normal.    Assessment & Plan:  1: NICM with preserved ejection fraction - - NYHA class III - minimally fluid overloaded today with worsening symptoms and elevated ReDs reading - weighing daily; reminded to call for an overnight weight gain of > 2 pounds or a weekly weight gain of > 5 pounds - weight down 3 pounds from last visit here 6 weeks ago - etiology likely valvular/ HTN - echo 03/30/22: EF 55-60% with mild/ moderate MR/ AR and mild/ moderate AS. Echo 07/29/21: EF of 55-60% along with moderate/severe MR but no LVH.  - ReDs reading today is 41% - double torsemide to 40mg  for today and tomorrow - BMP and repeat ReDs tomorrow - not adding salt and reviewed the importance of keeping daily sodium intake to 2000mg ; no longer eating cheetos but eating pork rinds instead and is aware of sodium content of these - drinking ~ 60-64 ounces of fluid daily - BNP 03/30/22 was 933.7  2: HTN with CKD- - BP  - continue amlodipine 5mg  daily - saw PCP Clent Ridges) 04/24 - saw nephrology Thedore Mins) 03/24 - BMP 04/07/22 reviewed and showed sodium 138, potassium 4.6, creatinine 1.36 & GFR 40  3: COPD- - saw pulmonology Jayme Cloud)  04/24 - wearing oxygen at 3L around the clock  - daily prednisone - she stopped using breztri because it was making her feel swimmy headed and resumed her nebulizer; encouraged her to f/u with pulmonologist regarding this  4: Valvular disease- - saw cardiology (Povsic) 11/23 - plan is to monitor her valvular disease  5: Anemia- - saw hematology Cathie Hoops) 2/24 - saw GI Tobi Bastos) 10/23 - hemoglobin 04/08/22 was 10.9 - ferrous sulfate 325mg  BID  Return tomorrow.

## 2022-06-03 NOTE — Progress Notes (Signed)
   06/03/22 1324  ReDS Vest / Clip  Station Marker A  Ruler Value 29  ReDS Value Range (!) > 40  ReDS Actual Value 41

## 2022-06-04 ENCOUNTER — Other Ambulatory Visit
Admission: RE | Admit: 2022-06-04 | Discharge: 2022-06-04 | Disposition: A | Discharge status: Home / Self Care | Payer: Medicare PPO | Source: Ambulatory Visit | Attending: Family | Admitting: Family

## 2022-06-04 ENCOUNTER — Telehealth: Payer: Self-pay

## 2022-06-04 ENCOUNTER — Ambulatory Visit
Admission: RE | Admit: 2022-06-04 | Discharge: 2022-06-04 | Disposition: A | Discharge status: Home / Self Care | Payer: Medicare PPO | Source: Ambulatory Visit | Attending: Family | Admitting: Family

## 2022-06-04 ENCOUNTER — Ambulatory Visit (HOSPITAL_BASED_OUTPATIENT_CLINIC_OR_DEPARTMENT_OTHER): Payer: Medicare PPO | Admitting: Family

## 2022-06-04 ENCOUNTER — Encounter: Payer: Self-pay | Admitting: Family

## 2022-06-04 VITALS — BP 130/56 | HR 88 | Wt 172.0 lb

## 2022-06-04 DIAGNOSIS — D500 Iron deficiency anemia secondary to blood loss (chronic): Secondary | ICD-10-CM

## 2022-06-04 DIAGNOSIS — I5032 Chronic diastolic (congestive) heart failure: Secondary | ICD-10-CM | POA: Diagnosis not present

## 2022-06-04 DIAGNOSIS — J449 Chronic obstructive pulmonary disease, unspecified: Secondary | ICD-10-CM

## 2022-06-04 DIAGNOSIS — I10 Essential (primary) hypertension: Secondary | ICD-10-CM

## 2022-06-04 DIAGNOSIS — R0602 Shortness of breath: Secondary | ICD-10-CM | POA: Diagnosis not present

## 2022-06-04 DIAGNOSIS — I080 Rheumatic disorders of both mitral and aortic valves: Secondary | ICD-10-CM | POA: Insufficient documentation

## 2022-06-04 DIAGNOSIS — R059 Cough, unspecified: Secondary | ICD-10-CM | POA: Diagnosis not present

## 2022-06-04 LAB — BASIC METABOLIC PANEL
Anion gap: 12 (ref 5–15)
BUN: 41 mg/dL — ABNORMAL HIGH (ref 8–23)
CO2: 25 mmol/L (ref 22–32)
Calcium: 8.5 mg/dL — ABNORMAL LOW (ref 8.9–10.3)
Chloride: 99 mmol/L (ref 98–111)
Creatinine, Ser: 1.7 mg/dL — ABNORMAL HIGH (ref 0.44–1.00)
GFR, Estimated: 30 mL/min — ABNORMAL LOW (ref >60)
Glucose, Bld: 133 mg/dL — ABNORMAL HIGH (ref 70–99)
Potassium: 4 mmol/L (ref 3.5–5.1)
Sodium: 136 mmol/L (ref 135–145)

## 2022-06-04 NOTE — Telephone Encounter (Signed)
Reviewed provider's note with patient. Patient acknowledged and states she will continue to take torsemide 40 mg daily for now. She verbalized that she will return for lab work in 2 weeks.  BMET order placed.

## 2022-06-04 NOTE — Progress Notes (Signed)
PCP: Dana Allan, MD (last seen 04/24) Primary Cardiologist: Fredirick Lathe, MD (last seen 11/23; returns 09/24)  HPI:  Kerri Carter is a 82 y/o female with a history of asthma, PAF, carotid disease, CAD, hyperlipidemia, HTN, CKD, anxiety, aortic stenosis, bipolar, conversion disorder, depression, GERD, ILD, macular degeneration, PAD, previous tobacco use and chronic heart failure.   Echo 03/30/22: EF 55-60% with mild/ moderate MR/ AR and mild/ moderate AS. Echo 07/29/21: EF of 55-60% along with moderate/severe MR but no LVH.   Admitted 03/28/22 due to SOB due to pneumonia and COPD. Antibiotics provided.   She presents today for a HF follow-up visit with a chief complaint of moderate fatigue with little exertion. Chronic in nature. Has associated SOB and loose productive cough along with this. She says that she is coughing up yellow/ green sputum. Take chronic prednisone.   She took an extra 20mg  torsemide yesterday and 40mg  torsemide this morning but says that it's hard to tell much difference yet.    ROS: All systems negative except as listed in HPI, PMH and Problem List.  SH:  Social History   Socioeconomic History   Marital status: Widowed    Spouse name: Kerri Carter   Number of children: 2   Years of education: Some Coll   Highest education level: Some college, no degree  Occupational History   Occupation: Retired    Comment: retired  Tobacco Use   Smoking status: Former    Packs/day: 0.50    Years: 20.00    Additional pack years: 0.00    Total pack years: 10.00    Types: Cigarettes    Quit date: 02/02/1974    Years since quitting: 48.3   Smokeless tobacco: Never  Vaping Use   Vaping Use: Never used  Substance and Sexual Activity   Alcohol use: No   Drug use: No   Sexual activity: Not Currently  Other Topics Concern   Not on file  Social History Narrative   Lives at home in [place] grandson, his wife and their child.   Husband Kerri Carter died covid 11 late  1/2054married x 51 years   Right-handed.   6 cups coffee per day.   Social Determinants of Health   Financial Resource Strain: Low Risk  (06/18/2021)   Overall Financial Resource Strain (CARDIA)    Difficulty of Paying Living Expenses: Not hard at all  Food Insecurity: No Food Insecurity (03/28/2022)   Hunger Vital Sign    Worried About Running Out of Food in the Last Year: Never true    Ran Out of Food in the Last Year: Never true  Transportation Needs: No Transportation Needs (03/28/2022)   PRAPARE - Administrator, Civil Service (Medical): No    Lack of Transportation (Non-Medical): No  Physical Activity: Inactive (11/24/2019)   Exercise Vital Sign    Days of Exercise per Week: 0 days    Minutes of Exercise per Session: 0 min  Stress: No Stress Concern Present (06/18/2021)   [employer] of Occupational Health - Occupational Stress Questionnaire    Feeling of Stress : Not at all  Social Connections: Moderately Integrated (06/18/2021)   Social Connection and Isolation Panel [NHANES]    Frequency of Communication with Friends and Family: More than three times a week    Frequency of Social Gatherings with Friends and Family: Never    Attends Religious Services: More than 4 times per year    Active Member of Clubs or Organizations: No  Attends Banker Meetings: Never    Marital Status: Married  Catering manager Violence: Not At Risk (03/28/2022)   Humiliation, Afraid, Rape, and Kick questionnaire    Fear of Current or Ex-Partner: No    Emotionally Abused: No    Physically Abused: No    Sexually Abused: No    FH:  Family History  Problem Relation Age of Onset   Rheum arthritis Mother    Asthma Mother    Parkinson's disease Mother    Heart disease Mother    Stroke Mother    Hypertension Mother    Heart attack Father    Heart disease Father    Hypertension Father    Peripheral Artery Disease Father    Diabetes Son    Gout Son    Asthma  Sister    Heart disease Sister    Lung cancer Sister    Heart disease Sister    Heart disease Sister    Breast cancer Sister    Heart attack Sister    Heart disease Brother    Heart disease Maternal Grandmother    Diabetes Maternal Grandmother    Colon cancer Maternal Grandmother    Cancer Maternal Grandmother        Hodgkins lymphoma   Heart disease Brother    Alcohol abuse Brother    Depression Brother    Dementia Son     Past Medical History:  Diagnosis Date   Acromial process of scapula fracture 12/21/2019   Acute kidney injury superimposed on chronic kidney disease (HCC) 05/26/2021   Acute on chronic combined systolic (congestive) and diastolic (congestive) heart failure (HCC) 07/28/2021   Acute on chronic combined systolic and diastolic CHF (congestive heart failure) (HCC) 05/26/2021   Acute on chronic respiratory failure with hypoxia (HCC) 09/10/2021   Acute pain of left knee 12/18/2020   Anemia in stage 3a chronic kidney disease (HCC) 10/09/2021   Anxiety    Anxiety 01/20/2019   Anxiety and depression 05/26/2021   Aortic stenosis 07/09/2015   a.) TTE 07/06/2015: EF 55-60%; mild AS with MPG 13.0 mmHg.   Arrhythmia    atrial fibrillation   Assistance needed for mobility 12/18/2020   Asthma    At high risk for falls 03/16/2019   Avascular necrosis of left shoulder due to adverse effect of steroid therapy (HCC) 01/20/2018   Bipolar disorder (HCC)    C. difficile diarrhea 2010   a.) following ABX course during hospital admission   Carotid atherosclerosis, bilateral    a.) Moderate; < 50% stenosis BILATERAL ICAs.   Cataract    a.) s/p BILATERAL extraction   CHF (congestive heart failure) (HCC)    Chicken pox    Chronic left shoulder pain 08/29/2019   Chronic respiratory failure with hypoxia (HCC) 10/01/2021   CKD (chronic kidney disease), stage III (HCC)    a. s/p R nephrectomy./ aneurysm   Closed fracture of acromial process of scapula 10/28/2018   Closed  nondisplaced fracture of proximal phalanx of lesser toe of right foot    Collapsed vertebra, not elsewhere classified, lumbar region, initial encounter for fracture (HCC) 02/21/2019   Community acquired pneumonia 07/22/2021   Complicated grief 03/16/2019   Compression fracture of L4 vertebra (HCC) 02/21/2019   Conversion disorder    Conversion disorder 08/04/2015   COPD (chronic obstructive pulmonary disease) (HCC)    COPD with exacerbation (HCC) 08/20/2021   Cough 05/02/2012   Depression    Dislocation of hip joint prosthesis (HCC) 01/08/2017  Elevated brain natriuretic peptide (BNP) level 12/18/2020   Elevated d-dimer 05/26/2021   Episodic mood disorder (HCC) 04/18/2019   Essential hypertension    Fatigue 07/07/2018   Frequent falls 12/18/2020   GERD (gastroesophageal reflux disease)    H/O cardiac catheterization 02/24/2011   Formatting of this note might be different from the original.  Repeated at [facility] 2/13 and insignificant disease   Hav (hallux abducto valgus), unspecified laterality 12/05/2018   Heart murmur    Hip fracture (HCC) 10/31/2016   Hip pain 12/18/2020   History of fracture of left hip 01/15/2017   History of syncope 08/25/2016   Hyperkalemia 05/26/2021   - The patient was given p.o. Lokelma and IV Lasix.  - We will follow potassium level.   Hyperlipidemia    Hypokalemia 08/25/2016   ILD (interstitial lung disease) (HCC)    mild; 2/2 RA diagnosis   Iliopsoas bursitis of right hip 08/29/2019   Inflammatory arthritis    a. hands/carpal tunnel.  b. Low titer rheumatoid factor. c. Negative anti-CCP antibodies. d. Plaquenil.   Insomnia 07/17/2015   Left foot pain 10/19/2016   Left lower lobe pneumonia 05/26/2021   Leg edema 05/02/2019   Leg pain, bilateral    Leg swelling 12/15/2016   Leukocytosis 10/19/2017   Leukopenia 06/24/2021   Lymphocytosis 07/13/2021   Macular degeneration    Microcytic anemia 08/15/2015   Mixed bipolar I disorder (HCC)  10/09/2015   Nocturnal hypoxemia    Nocturnal hypoxemia 09/09/2011   June 2013 overnight oximetry on 6 centimeters of water CPAP: SpO2 less than 88% 72% of the time   Non-Obstructive CAD    a. 07/2009 Cath ([facility]): nonobs dzs;  b. 03/2011 Cath [facility]): nonobs dzs.   NSIP (nonspecific interstitial pneumonitis) (HCC) 12/08/2019   Osteoarthritis    a. Knees.   Other shoulder lesions, left shoulder 12/20/2017   Other specified postprocedural states 07/20/2013   Overweight (BMI 25.0-29.9) 08/29/2019   PAD (peripheral artery disease) (HCC)    Pain due to onychomycosis of toenails of both feet 12/05/2018   Pelvic mass in female 03/16/2019   02/21/19 CT pelvis w/o contrast  Cystic left adnexal lesion has enlarged since 2018 where it  measured approximately 1.8 x 1.7 cm. This is not well assessed due  to streak artifact as well as lack of contrast. Ultrasound follow-up  may be helpful in 8-12 weeks      04/05/19 TVUS     IMPRESSION:  Simple appearing cystic structure within the left ovary measuring up  to 2.9 cm. While this has enlarged s   Physical deconditioning 02/27/2021   Pleural effusion on left 12/18/2020   PUD (peptic ulcer disease)    Recurrent falls 09/01/2021   Recurrent pneumonia 07/23/2021   Repeated falls 12/18/2020   Respiratory failure with hypoxia (HCC) 01/12/2021   Rotator cuff tendinitis, right 07/26/2017   S/P lumbar fusion 05/23/2021   S/P right hip fracture    11/01/16 s/p repair   Sepsis due to pneumonia (HCC) 07/22/2021   Shoulder pain    Sleep apnea    no cpap / minimal   SOB (shortness of breath) 10/13/2011   2011 [facility] eval >> LHC (non-obstructive CAD), RHC (wedge 13, mean PA 26), TTE (LVEF > 55%, mild concentric LVH, mild AR, MR, PR, TR), CTAngio chest unrevealing; "well controlled asthma"  02/2011 TTE [facility]>> NL LVEF, Mild LVH, Mod AR/Mod MR   Spinal stenosis at L4-L5 level    severe with L4/L5  anterolisthesis grade 1 anterolisthesis    Spinal  stenosis of lumbar region 09/15/2016   Last Assessment & Plan:   Now post op with continued back and leg pain noted. Is working with pt/ot and surgery was said to have no complications. Bowels are moving and general pain and anxiety is basically baseline.    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   Weakness of both lower extremities 12/18/2020   Prior to Admission medications   Medication Sig Start Date End Date Taking? Authorizing Provider  albuterol (PROVENTIL) (2.5 MG/3ML) 0.083% nebulizer solution Take by nebulization. 12/08/21  Yes [provider]  albuterol (VENTOLIN HFA) 108 (90 Base) MCG/ACT inhaler Inhale 2 puffs into the lungs every 6 (six) hours as needed for wheezing or shortness of breath. 06/24/21  Yes McLean-Scocuzza, Pasty Spillers, MD  amLODipine (NORVASC) 5 MG tablet Take 1 tablet (5 mg total) by mouth daily. 08/01/21  Yes McLean-Scocuzza, Pasty Spillers, MD  apixaban (ELIQUIS) 5 MG TABS tablet Take 1 tablet (5 mg total) by mouth 2 (two) times daily. 10/15/21  Yes McLean-Scocuzza, Pasty Spillers, MD  budesonide (PULMICORT) 0.5 MG/2ML nebulizer solution Take 2 mLs (0.5 mg total) by nebulization 2 (two) times daily. 01/01/22 01/01/23 Yes Salena Saner, MD  busPIRone (BUSPAR) 5 MG tablet Take 1 tablet (5 mg total) by mouth 2 (two) times daily. 06/02/21  Yes Lurene Shadow, MD  cetirizine (ZYRTEC ALLERGY) 10 MG tablet Take 1 tablet (10 mg total) by mouth at bedtime. 02/26/22  Yes  Salena Saner, MD  Cholecalciferol (VITAMIN D3) 50 MCG (2000 UT) TABS Take 4,000 Units by mouth daily. 08/01/21  Yes McLean-Scocuzza, Pasty Spillers, MD  dicyclomine (BENTYL) 10 MG capsule TAKE 1 CAPSULE (10 MG TOTAL) BY MOUTH 2 (TWO) TIMES DAILY AS NEEDED FOR SPASMS. Patient taking differently: Take 10 mg by mouth 2 (two) times daily. 11/03/21 10/29/22 Yes Wyline Mood, MD  ENBREL SURECLICK 50 MG/ML injection Inject 50 mg into the skin once a week. Takes on Fridays   Yes [provider]  escitalopram (LEXAPRO) 10 MG tablet Take 1 tablet (10 mg total) by mouth daily. 08/01/21  Yes McLean-Scocuzza, Pasty Spillers, MD  ferrous sulfate 325 (65 FE) MG EC tablet Take 1 tablet (325 mg total) by mouth 2 (two) times daily. 09/12/21 09/12/22 Yes Danford, Earl Lites, MD  gabapentin (NEURONTIN) 300 MG capsule Take 300 mg in the morning and 600 mg at night. 01/06/22  Yes Dana Allan, MD  ipratropium-albuterol (DUONEB) 0.5-2.5 (3) MG/3ML SOLN SMARTSIG:3 Milliliter(s) Via Nebulizer Every 6 Hours PRN 03/25/22  Yes [provider]  lamoTRIgine (LAMICTAL) 100 MG tablet TAKE 1 TAB 2 TIMES DAILY. FURTHER REFILLS NEW PSYCH FOR ALL PSYCH MEDS ONLY TEMP SUPPLY FROM PCP Patient taking differently: Take 100 mg by mouth 2 (two) times daily. 05/02/21  Yes McLean-Scocuzza, Pasty Spillers, MD  leflunomide (ARAVA) 20 MG tablet Take 1 tablet (20 mg total) by mouth daily. 09/30/17  Yes McLean-Scocuzza, Pasty Spillers, MD  lidocaine (LIDODERM) 5 % Place 1 patch onto the skin 2 (two) times daily as needed. Remove & Discard patch within 12 hours or as directed by MD Patient taking differently: Place 1 patch onto the skin daily as needed (pain). 11/22/20  Yes McLean-Scocuzza, Pasty Spillers, MD  lovastatin (MEVACOR) 20 MG tablet TAKE 1 TABLET BY MOUTH EVERYDAY AT BEDTIME *STOP TALKING 40MG * Patient taking differently: Take 20 mg by mouth at bedtime. 08/01/21  Yes McLean-Scocuzza, Pasty Spillers, MD  methocarbamol (ROBAXIN) 500 MG tablet Take 500 mg by mouth 4  (four) times daily as needed for muscle spasms. Taking twice a day currently 01/23/21  Yes [provider]  mometasone (NASONEX) 50 MCG/ACT nasal spray 1 spray to each nostril twice a day 02/26/22  Yes Salena Saner, MD  montelukast (SINGULAIR) 10 MG tablet TAKE 1 TABLET BY MOUTH EVERY DAY Patient taking differently: Take 10 mg by mouth daily. 03/24/21  Yes McLean-Scocuzza, Pasty Spillers, MD  multivitamin-lutein [facility]) CAPS capsule Take 1 capsule by mouth at bedtime.   Yes [provider]  nebivolol (BYSTOLIC) 5 MG tablet Take 1 tablet (5 mg total) by mouth in the morning and at bedtime. (Note dose changed from 1/2 10 mg bid to 5 mg bid) Patient taking differently: Take 10 mg by mouth in the morning and at bedtime. 08/01/21  Yes McLean-Scocuzza, Pasty Spillers, MD  OXYGEN Inhale 3 L into the lungs continuous.   Yes [provider]  pantoprazole (PROTONIX) 40 MG tablet TAKE 1 TABLET BY MOUTH 2 TIMES DAILY 30 MIN BEFORE FOOD (NOTE REDUCTION IN FREQUENCY) 11/03/21  Yes McLean-Scocuzza, Pasty Spillers, MD  predniSONE (DELTASONE) 5 MG tablet TAKE 1 TABLET BY MOUTH EVERY DAY WITH BREAKFAST 02/20/22  Yes Salena Saner, MD  PROLIA 60 MG/ML SOSY injection Inject 60 mg into the skin every 6 (six) months. 03/09/22  Yes [provider]  QUEtiapine (SEROQUEL) 25 MG tablet TAKE 1 TABLET (25 MG TOTAL) BY MOUTH AT BEDTIME. AGAIN LAST FILL FURTHER REFILLS FROM PSYCHIATRY NO EXCEPTIONS 09/13/20  Yes McLean-Scocuzza, Pasty Spillers, MD  sucralfate (CARAFATE) 1 g tablet TAKE 1 TABLET BY MOUTH 2 TIMES DAILY. 11/12/21  Yes Wyline Mood, MD  torsemide (DEMADEX) 20 MG tablet Take 1 tablet (20 mg total) by mouth daily. 09/12/21  Yes Danford, Earl Lites, MD  traMADol (ULTRAM) 50 MG tablet Take 1 tablet (50 mg total) by mouth 2 (two) times daily. 04/08/22  Yes Enedina Finner, MD  Vibegron (GEMTESA) 75 MG TABS Take 75 mg by mouth daily. 01/08/22  Yes Vaillancourt, Lelon Mast, PA-C  Budeson-Glycopyrrol-Formoterol  (BREZTRI AEROSPHERE) 160-9-4.8 MCG/ACT AERO Inhale 2 puffs into the lungs in the morning and at bedtime. Patient not taking: Reported on 06/03/2022 05/28/22   Salena Saner, MD   Vitals:   06/04/22 1045  BP: (!) 130/56  Pulse: 88  SpO2: 98%  Weight: 172 lb (78 kg)   Wt Readings from Last 3 Encounters:  06/04/22 172 lb (78 kg)  06/03/22 171 lb 6 oz (77.7 kg)  05/28/22 171 lb (77.6 kg)   Lab Results  Component Value Date   CREATININE 1.70 (H) 06/04/2022   CREATININE 1.36 (H) 04/07/2022   CREATININE 1.23 (H) 04/05/2022   PHYSICAL EXAM:  General:  Well appearing. No resp difficulty HEENT: normal Neck: supple. JVP flat.  No lymphadenopathy or thryomegaly appreciated. Cor: PMI normal. Regular rate & rhythm. No rubs, gallops or murmurs. Lungs: expiratory wheezing in LUL, LLL Abdomen: soft, nontender, nondistended. No hepatosplenomegaly. No bruits  or masses.  Extremities: no cyanosis, clubbing, rash. 1+ pitting edema right lower leg, trace pitting left lower leg Neuro: alert & orientedx3, cranial nerves grossly intact. Moves all 4 extremities w/o difficulty. Affect pleasant.  ReDs: 40%, yesterday was 41%  ASSESSMENT & PLAN:  1: NICM with preserved ejection fraction - - NYHA class III - minimally fluid overloaded today with SOB/ cough and continued elevated ReDs reading - weighing daily; reminded to call for an overnight weight gain of > 2 pounds or a weekly weight gain of > 5 pounds - weight stable from last visit here yesterday - etiology likely valvular/ HTN - echo 03/30/22: EF 55-60% with mild/ moderate MR/ AR and mild/ moderate AS. Echo 07/29/21: EF of 55-60% along with moderate/severe MR but no LVH.  - ReDs reading yesterday was 41%; today is 40% - took 40mg  torsemide yesterday and this morning - BMP today to evaluate continued current diuretic dose - not adding salt and reviewed the importance of keeping daily sodium intake to 2000mg ; no longer eating cheetos but eating  pork rinds instead and is aware of sodium content of these - drinking ~ 60-64 ounces of fluid daily - BNP 03/30/22 was 933.7 - PharmD reconciled meds w/ patient  2: HTN with CKD- - BP 130/56 - continue amlodipine 5mg  daily - saw PCP Kerri Carter) 04/24 - saw nephrology Kerri Carter) 03/24 - BMP 04/07/22 reviewed and showed sodium 138, potassium 4.6, creatinine 1.36 & GFR 40 - BMP today  3: COPD- - saw pulmonology Kerri Carter) 04/24 - wearing oxygen at 3L around the clock  - daily prednisone - she stopped using breztri because it was making her feel swimmy headed and resumed her nebulizer; encouraged her to f/u with pulmonologist regarding this - CXR today to rule out pneumonia  4: Valvular disease- - saw cardiology (Kerri Carter) 11/23 - plan is to monitor her valvular disease  5: Anemia- - saw hematology Kerri Carter) 2/24 - saw GI Kerri Carter) 10/23 - hemoglobin 04/08/22 was 10.9 - ferrous sulfate 325mg  BID  Return in 1 month, sooner if needed.

## 2022-06-04 NOTE — Telephone Encounter (Signed)
I have notified the patient. Nothing further needed. 

## 2022-06-04 NOTE — Telephone Encounter (Signed)
I spoke with the patient. She said when she took the [place] she got swimmy headed when she stood up. She stopped taking it and went back on her Pulmicort and DuoNeb. She wants to know if that was the right thing to do?  She is having SOB and a cough but she has been to see Cardiology and Congestive Heart Failure.

## 2022-06-04 NOTE — Telephone Encounter (Signed)
-----   Message from Delma Freeze, [place] sent at 06/04/2022 12:45 PM EDT ----- Sodium/ potassium levels are normal. Kidney function is slightly worse from 2 months ago but please continue the torsemide 40mg  (2 tablets) for now. You need to return in 2 weeks to get your lab work rechecked.

## 2022-06-04 NOTE — Telephone Encounter (Signed)
She was the one who was insisting on trying the [place].  We only provided her with samples.  And continue DuoNeb and Pulmicort as she was doing before.  She was using the DuoNeb and the Pulmicort twice a day but she knows that she can use the DuoNeb up to 4 times a day.  Other possibilities for the "swimmy headed" feeling could be because of her heart.  She needs to continue follow-up with the heart failure clinic.

## 2022-06-08 ENCOUNTER — Other Ambulatory Visit: Payer: Self-pay | Admitting: Pulmonary Disease

## 2022-06-09 ENCOUNTER — Telehealth: Payer: Self-pay | Admitting: Family Medicine

## 2022-06-09 DIAGNOSIS — I5042 Chronic combined systolic (congestive) and diastolic (congestive) heart failure: Secondary | ICD-10-CM | POA: Diagnosis not present

## 2022-06-09 DIAGNOSIS — J9621 Acute and chronic respiratory failure with hypoxia: Secondary | ICD-10-CM | POA: Diagnosis not present

## 2022-06-09 DIAGNOSIS — I130 Hypertensive heart and chronic kidney disease with heart failure and stage 1 through stage 4 chronic kidney disease, or unspecified chronic kidney disease: Secondary | ICD-10-CM | POA: Diagnosis not present

## 2022-06-09 DIAGNOSIS — G4733 Obstructive sleep apnea (adult) (pediatric): Secondary | ICD-10-CM | POA: Diagnosis not present

## 2022-06-09 DIAGNOSIS — J441 Chronic obstructive pulmonary disease with (acute) exacerbation: Secondary | ICD-10-CM | POA: Diagnosis not present

## 2022-06-09 DIAGNOSIS — D509 Iron deficiency anemia, unspecified: Secondary | ICD-10-CM | POA: Diagnosis not present

## 2022-06-09 DIAGNOSIS — F319 Bipolar disorder, unspecified: Secondary | ICD-10-CM | POA: Diagnosis not present

## 2022-06-09 DIAGNOSIS — N1832 Chronic kidney disease, stage 3b: Secondary | ICD-10-CM | POA: Diagnosis not present

## 2022-06-09 DIAGNOSIS — D631 Anemia in chronic kidney disease: Secondary | ICD-10-CM | POA: Diagnosis not present

## 2022-06-09 NOTE — Telephone Encounter (Signed)
Contacted Kerri Carter to schedule their annual wellness visit. Appointment made for 06/22/2022.  [name]; Care Guide Ambulatory Clinical Support [facility], [facility] Direct Dial: [phone]

## 2022-06-16 DIAGNOSIS — J441 Chronic obstructive pulmonary disease with (acute) exacerbation: Secondary | ICD-10-CM | POA: Diagnosis not present

## 2022-06-16 DIAGNOSIS — F319 Bipolar disorder, unspecified: Secondary | ICD-10-CM | POA: Diagnosis not present

## 2022-06-16 DIAGNOSIS — J9621 Acute and chronic respiratory failure with hypoxia: Secondary | ICD-10-CM | POA: Diagnosis not present

## 2022-06-16 DIAGNOSIS — D509 Iron deficiency anemia, unspecified: Secondary | ICD-10-CM | POA: Diagnosis not present

## 2022-06-16 DIAGNOSIS — N1832 Chronic kidney disease, stage 3b: Secondary | ICD-10-CM | POA: Diagnosis not present

## 2022-06-16 DIAGNOSIS — G4733 Obstructive sleep apnea (adult) (pediatric): Secondary | ICD-10-CM | POA: Diagnosis not present

## 2022-06-16 DIAGNOSIS — D631 Anemia in chronic kidney disease: Secondary | ICD-10-CM | POA: Diagnosis not present

## 2022-06-16 DIAGNOSIS — I130 Hypertensive heart and chronic kidney disease with heart failure and stage 1 through stage 4 chronic kidney disease, or unspecified chronic kidney disease: Secondary | ICD-10-CM | POA: Diagnosis not present

## 2022-06-16 DIAGNOSIS — I5042 Chronic combined systolic (congestive) and diastolic (congestive) heart failure: Secondary | ICD-10-CM | POA: Diagnosis not present

## 2022-06-17 ENCOUNTER — Telehealth: Payer: Self-pay | Admitting: Family Medicine

## 2022-06-17 NOTE — Telephone Encounter (Signed)
Prescription Request  06/17/2022  LOV: 05/21/2022  What is the name of the medication or equipment? methocarbamol (ROBAXIN) 500 MG tablet  Have you contacted your pharmacy to request a refill? Yes   Which pharmacy would you like this sent to?   [id number] - [place], [place] - [address] [place] [address] Phone: [phone] Fax: [phone]    Patient notified that their request is being sent to the clinical staff for review and that they should receive a response within 2 business days.   Please advise at [facility] [phone]

## 2022-06-18 DIAGNOSIS — I5042 Chronic combined systolic (congestive) and diastolic (congestive) heart failure: Secondary | ICD-10-CM | POA: Diagnosis not present

## 2022-06-18 DIAGNOSIS — N1832 Chronic kidney disease, stage 3b: Secondary | ICD-10-CM | POA: Diagnosis not present

## 2022-06-18 DIAGNOSIS — D509 Iron deficiency anemia, unspecified: Secondary | ICD-10-CM | POA: Diagnosis not present

## 2022-06-18 DIAGNOSIS — J441 Chronic obstructive pulmonary disease with (acute) exacerbation: Secondary | ICD-10-CM | POA: Diagnosis not present

## 2022-06-18 DIAGNOSIS — F319 Bipolar disorder, unspecified: Secondary | ICD-10-CM | POA: Diagnosis not present

## 2022-06-18 DIAGNOSIS — D631 Anemia in chronic kidney disease: Secondary | ICD-10-CM | POA: Diagnosis not present

## 2022-06-18 DIAGNOSIS — J9621 Acute and chronic respiratory failure with hypoxia: Secondary | ICD-10-CM | POA: Diagnosis not present

## 2022-06-18 DIAGNOSIS — I130 Hypertensive heart and chronic kidney disease with heart failure and stage 1 through stage 4 chronic kidney disease, or unspecified chronic kidney disease: Secondary | ICD-10-CM | POA: Diagnosis not present

## 2022-06-18 DIAGNOSIS — G4733 Obstructive sleep apnea (adult) (pediatric): Secondary | ICD-10-CM | POA: Diagnosis not present

## 2022-06-18 NOTE — Telephone Encounter (Signed)
Per chart last rx shows 10/11/21, d/c'd 12/04/21 (historical so no # info). No one here has rx'd this for patient.   Robaxin 500mg  1 TAB PO TID  Patient states she was given this by ortho for back and arm pain. She has been released from ortho and would like to know if you could continue this med for her.  Please advise, thank you!

## 2022-06-18 NOTE — Telephone Encounter (Signed)
Per patient she will let us know if she does not get refill from ortho, then may consider OV here. Nothing further needed at this time.

## 2022-06-22 ENCOUNTER — Ambulatory Visit (INDEPENDENT_AMBULATORY_CARE_PROVIDER_SITE_OTHER): Payer: Medicare PPO

## 2022-06-22 NOTE — Progress Notes (Signed)
  I attempted to contact the patient for her scheduled Virtual Telephone Annual Wellness Visit. No answer. I left a detailed message on the patient's voicemail.   

## 2022-06-23 DIAGNOSIS — J441 Chronic obstructive pulmonary disease with (acute) exacerbation: Secondary | ICD-10-CM | POA: Diagnosis not present

## 2022-06-24 ENCOUNTER — Telehealth: Payer: Self-pay | Admitting: Pulmonary Disease

## 2022-06-24 NOTE — Telephone Encounter (Signed)
Okay to generate a letter.

## 2022-06-24 NOTE — Telephone Encounter (Signed)
Pt states she needs an rx in order for [employer] to allow her to board with her POC. She is asking for a call back and for it to be emailed to her. Her flight is 06/30/22

## 2022-06-24 NOTE — Telephone Encounter (Signed)
Airline is needing to know what type of POC patient has.  I have spoke to patient and requested that she take a picture and email it to me. Patient is unable to tell me what type of POC she has.

## 2022-06-24 NOTE — Telephone Encounter (Signed)
Spoke to [place] with [employer]. She stated that POC brand is not listed within patient's account.

## 2022-06-24 NOTE — Telephone Encounter (Signed)
Called and spoke to patient.  She is planning to fly to [place] 06/30/2022. She is needing a letter to allow her to board with her POC.  Flight number for [employer] is [id number] and [employer] flight number [id number].  Dr. Jayme Cloud, please advise. Thanks

## 2022-06-25 ENCOUNTER — Telehealth: Payer: Self-pay

## 2022-06-25 NOTE — Telephone Encounter (Signed)
Spoke to patient for update. She is having issues with sending picture to email. She is going to bring machine by our office.

## 2022-06-25 NOTE — Telephone Encounter (Signed)
The patient came into the office so we could get the information off her POC. We filled out her form while she was in the office.  Nothing further needed.

## 2022-06-25 NOTE — Telephone Encounter (Signed)
Called patient to review meds for appt on 06/30/22 states she needs to change appt due to going out of town. Transferred call to front desk.

## 2022-06-25 NOTE — Telephone Encounter (Signed)
Patient called to confirm that the pictures were sent thru email.  Please let patient know if it went thru, otherwise patient will have to bring the machine to the office.  Patient would like a call to let her know.  CB# [phone]

## 2022-06-28 ENCOUNTER — Other Ambulatory Visit: Payer: Self-pay | Admitting: Pulmonary Disease

## 2022-06-29 DIAGNOSIS — I5042 Chronic combined systolic (congestive) and diastolic (congestive) heart failure: Secondary | ICD-10-CM | POA: Diagnosis not present

## 2022-06-29 DIAGNOSIS — I130 Hypertensive heart and chronic kidney disease with heart failure and stage 1 through stage 4 chronic kidney disease, or unspecified chronic kidney disease: Secondary | ICD-10-CM | POA: Diagnosis not present

## 2022-06-29 DIAGNOSIS — D631 Anemia in chronic kidney disease: Secondary | ICD-10-CM | POA: Diagnosis not present

## 2022-06-29 DIAGNOSIS — J441 Chronic obstructive pulmonary disease with (acute) exacerbation: Secondary | ICD-10-CM | POA: Diagnosis not present

## 2022-06-29 DIAGNOSIS — F319 Bipolar disorder, unspecified: Secondary | ICD-10-CM | POA: Diagnosis not present

## 2022-06-29 DIAGNOSIS — D509 Iron deficiency anemia, unspecified: Secondary | ICD-10-CM | POA: Diagnosis not present

## 2022-06-29 DIAGNOSIS — G4733 Obstructive sleep apnea (adult) (pediatric): Secondary | ICD-10-CM | POA: Diagnosis not present

## 2022-06-29 DIAGNOSIS — J9621 Acute and chronic respiratory failure with hypoxia: Secondary | ICD-10-CM | POA: Diagnosis not present

## 2022-06-29 DIAGNOSIS — N1832 Chronic kidney disease, stage 3b: Secondary | ICD-10-CM | POA: Diagnosis not present

## 2022-06-30 ENCOUNTER — Ambulatory Visit: Payer: Medicare PPO | Admitting: Student in an Organized Health Care Education/Training Program

## 2022-07-06 ENCOUNTER — Encounter: Payer: Medicare PPO | Admitting: Family

## 2022-07-16 ENCOUNTER — Inpatient Hospital Stay
Admission: EM | Admit: 2022-07-16 | Discharge: 2022-07-19 | DRG: 291 | Disposition: A | Discharge status: Home Health | Payer: Medicare PPO | Attending: Internal Medicine | Admitting: Internal Medicine

## 2022-07-16 ENCOUNTER — Emergency Department: Payer: Medicare PPO

## 2022-07-16 ENCOUNTER — Observation Stay: Payer: Medicare PPO

## 2022-07-16 ENCOUNTER — Other Ambulatory Visit: Payer: Self-pay

## 2022-07-16 DIAGNOSIS — Z825 Family history of asthma and other chronic lower respiratory diseases: Secondary | ICD-10-CM

## 2022-07-16 DIAGNOSIS — M069 Rheumatoid arthritis, unspecified: Secondary | ICD-10-CM | POA: Diagnosis present

## 2022-07-16 DIAGNOSIS — I959 Hypotension, unspecified: Secondary | ICD-10-CM | POA: Diagnosis not present

## 2022-07-16 DIAGNOSIS — I509 Heart failure, unspecified: Secondary | ICD-10-CM | POA: Diagnosis not present

## 2022-07-16 DIAGNOSIS — Z66 Do not resuscitate: Secondary | ICD-10-CM | POA: Diagnosis present

## 2022-07-16 DIAGNOSIS — Z79620 Long term (current) use of immunosuppressive biologic: Secondary | ICD-10-CM

## 2022-07-16 DIAGNOSIS — Z811 Family history of alcohol abuse and dependence: Secondary | ICD-10-CM

## 2022-07-16 DIAGNOSIS — Z803 Family history of malignant neoplasm of breast: Secondary | ICD-10-CM

## 2022-07-16 DIAGNOSIS — Z818 Family history of other mental and behavioral disorders: Secondary | ICD-10-CM

## 2022-07-16 DIAGNOSIS — Z833 Family history of diabetes mellitus: Secondary | ICD-10-CM | POA: Diagnosis not present

## 2022-07-16 DIAGNOSIS — E8779 Other fluid overload: Secondary | ICD-10-CM | POA: Diagnosis not present

## 2022-07-16 DIAGNOSIS — Z800 Family history of malignant neoplasm of digestive organs: Secondary | ICD-10-CM

## 2022-07-16 DIAGNOSIS — Z823 Family history of stroke: Secondary | ICD-10-CM

## 2022-07-16 DIAGNOSIS — I130 Hypertensive heart and chronic kidney disease with heart failure and stage 1 through stage 4 chronic kidney disease, or unspecified chronic kidney disease: Principal | ICD-10-CM | POA: Diagnosis present

## 2022-07-16 DIAGNOSIS — J90 Pleural effusion, not elsewhere classified: Secondary | ICD-10-CM | POA: Diagnosis not present

## 2022-07-16 DIAGNOSIS — M800AXA Age-related osteoporosis with current pathological fracture, other site, initial encounter for fracture: Secondary | ICD-10-CM | POA: Diagnosis present

## 2022-07-16 DIAGNOSIS — Z96651 Presence of right artificial knee joint: Secondary | ICD-10-CM | POA: Diagnosis present

## 2022-07-16 DIAGNOSIS — Z87891 Personal history of nicotine dependence: Secondary | ICD-10-CM

## 2022-07-16 DIAGNOSIS — E8770 Fluid overload, unspecified: Principal | ICD-10-CM | POA: Insufficient documentation

## 2022-07-16 DIAGNOSIS — J9611 Chronic respiratory failure with hypoxia: Secondary | ICD-10-CM | POA: Diagnosis present

## 2022-07-16 DIAGNOSIS — I700 Atherosclerosis of aorta: Secondary | ICD-10-CM | POA: Diagnosis not present

## 2022-07-16 DIAGNOSIS — I2510 Atherosclerotic heart disease of native coronary artery without angina pectoris: Secondary | ICD-10-CM | POA: Diagnosis present

## 2022-07-16 DIAGNOSIS — Z1152 Encounter for screening for COVID-19: Secondary | ICD-10-CM

## 2022-07-16 DIAGNOSIS — Z91041 Radiographic dye allergy status: Secondary | ICD-10-CM

## 2022-07-16 DIAGNOSIS — Z8249 Family history of ischemic heart disease and other diseases of the circulatory system: Secondary | ICD-10-CM

## 2022-07-16 DIAGNOSIS — J449 Chronic obstructive pulmonary disease, unspecified: Secondary | ICD-10-CM | POA: Diagnosis present

## 2022-07-16 DIAGNOSIS — I480 Paroxysmal atrial fibrillation: Secondary | ICD-10-CM | POA: Diagnosis present

## 2022-07-16 DIAGNOSIS — Z886 Allergy status to analgesic agent status: Secondary | ICD-10-CM

## 2022-07-16 DIAGNOSIS — G4730 Sleep apnea, unspecified: Secondary | ICD-10-CM | POA: Diagnosis present

## 2022-07-16 DIAGNOSIS — J189 Pneumonia, unspecified organism: Secondary | ICD-10-CM

## 2022-07-16 DIAGNOSIS — M7989 Other specified soft tissue disorders: Secondary | ICD-10-CM | POA: Diagnosis not present

## 2022-07-16 DIAGNOSIS — I739 Peripheral vascular disease, unspecified: Secondary | ICD-10-CM | POA: Diagnosis present

## 2022-07-16 DIAGNOSIS — Z905 Acquired absence of kidney: Secondary | ICD-10-CM | POA: Diagnosis not present

## 2022-07-16 DIAGNOSIS — E785 Hyperlipidemia, unspecified: Secondary | ICD-10-CM | POA: Diagnosis present

## 2022-07-16 DIAGNOSIS — R0602 Shortness of breath: Secondary | ICD-10-CM | POA: Diagnosis present

## 2022-07-16 DIAGNOSIS — I060 Rheumatic aortic stenosis: Secondary | ICD-10-CM | POA: Diagnosis present

## 2022-07-16 DIAGNOSIS — Z96643 Presence of artificial hip joint, bilateral: Secondary | ICD-10-CM | POA: Diagnosis present

## 2022-07-16 DIAGNOSIS — H3530 Unspecified macular degeneration: Secondary | ICD-10-CM | POA: Diagnosis present

## 2022-07-16 DIAGNOSIS — I5033 Acute on chronic diastolic (congestive) heart failure: Secondary | ICD-10-CM | POA: Diagnosis present

## 2022-07-16 DIAGNOSIS — Z882 Allergy status to sulfonamides status: Secondary | ICD-10-CM

## 2022-07-16 DIAGNOSIS — Z96612 Presence of left artificial shoulder joint: Secondary | ICD-10-CM | POA: Diagnosis present

## 2022-07-16 DIAGNOSIS — Z91128 Patient's intentional underdosing of medication regimen for other reason: Secondary | ICD-10-CM

## 2022-07-16 DIAGNOSIS — I110 Hypertensive heart disease with heart failure: Secondary | ICD-10-CM | POA: Diagnosis not present

## 2022-07-16 DIAGNOSIS — Z96611 Presence of right artificial shoulder joint: Secondary | ICD-10-CM | POA: Diagnosis present

## 2022-07-16 DIAGNOSIS — Z8261 Family history of arthritis: Secondary | ICD-10-CM

## 2022-07-16 DIAGNOSIS — Z7901 Long term (current) use of anticoagulants: Secondary | ICD-10-CM | POA: Diagnosis not present

## 2022-07-16 DIAGNOSIS — Z885 Allergy status to narcotic agent status: Secondary | ICD-10-CM

## 2022-07-16 DIAGNOSIS — Z981 Arthrodesis status: Secondary | ICD-10-CM

## 2022-07-16 DIAGNOSIS — Z881 Allergy status to other antibiotic agents status: Secondary | ICD-10-CM

## 2022-07-16 DIAGNOSIS — Z79899 Other long term (current) drug therapy: Secondary | ICD-10-CM

## 2022-07-16 DIAGNOSIS — Z888 Allergy status to other drugs, medicaments and biological substances status: Secondary | ICD-10-CM

## 2022-07-16 DIAGNOSIS — Z801 Family history of malignant neoplasm of trachea, bronchus and lung: Secondary | ICD-10-CM

## 2022-07-16 DIAGNOSIS — S2231XA Fracture of one rib, right side, initial encounter for closed fracture: Secondary | ICD-10-CM

## 2022-07-16 DIAGNOSIS — R0902 Hypoxemia: Secondary | ICD-10-CM | POA: Diagnosis not present

## 2022-07-16 DIAGNOSIS — Z820 Family history of epilepsy and other diseases of the nervous system: Secondary | ICD-10-CM

## 2022-07-16 DIAGNOSIS — Z7951 Long term (current) use of inhaled steroids: Secondary | ICD-10-CM

## 2022-07-16 DIAGNOSIS — D631 Anemia in chronic kidney disease: Secondary | ICD-10-CM | POA: Diagnosis present

## 2022-07-16 DIAGNOSIS — Z887 Allergy status to serum and vaccine status: Secondary | ICD-10-CM

## 2022-07-16 DIAGNOSIS — N1831 Chronic kidney disease, stage 3a: Secondary | ICD-10-CM | POA: Diagnosis present

## 2022-07-16 DIAGNOSIS — Z9981 Dependence on supplemental oxygen: Secondary | ICD-10-CM

## 2022-07-16 LAB — BASIC METABOLIC PANEL
Anion gap: 12 (ref 5–15)
BUN: 18 mg/dL (ref 8–23)
CO2: 23 mmol/L (ref 22–32)
Calcium: 9.1 mg/dL (ref 8.9–10.3)
Chloride: 104 mmol/L (ref 98–111)
Creatinine, Ser: 1.21 mg/dL — ABNORMAL HIGH (ref 0.44–1.00)
GFR, Estimated: 46 mL/min — ABNORMAL LOW (ref >60)
Glucose, Bld: 141 mg/dL — ABNORMAL HIGH (ref 70–99)
Potassium: 4.9 mmol/L (ref 3.5–5.1)
Sodium: 139 mmol/L (ref 135–145)

## 2022-07-16 LAB — CBC
HCT: 36.9 % (ref 36.0–46.0)
Hemoglobin: 11.4 g/dL — ABNORMAL LOW (ref 12.0–15.0)
MCH: 27 pg (ref 26.0–34.0)
MCHC: 30.9 g/dL (ref 30.0–36.0)
MCV: 87.2 fL (ref 80.0–100.0)
Platelets: 232 10*3/uL (ref 150–400)
RBC: 4.23 MIL/uL (ref 3.87–5.11)
RDW: 15.8 % — ABNORMAL HIGH (ref 11.5–15.5)
WBC: 10 10*3/uL (ref 4.0–10.5)
nRBC: 0 % (ref 0.0–0.2)

## 2022-07-16 LAB — TROPONIN I (HIGH SENSITIVITY): Troponin I (High Sensitivity): 14 ng/L (ref <18)

## 2022-07-16 LAB — BRAIN NATRIURETIC PEPTIDE: B Natriuretic Peptide: 446.2 pg/mL — ABNORMAL HIGH (ref 0.0–100.0)

## 2022-07-16 IMAGING — DX DG SHOULDER 2+V*R*
3 series · 3 of 3 positions shown · non-contrast
Comparison: None Available.

CLINICAL DATA: Right arm pain after fall.

EXAM:
RIGHT SHOULDER - 2+ VIEW

[shoulder axial]
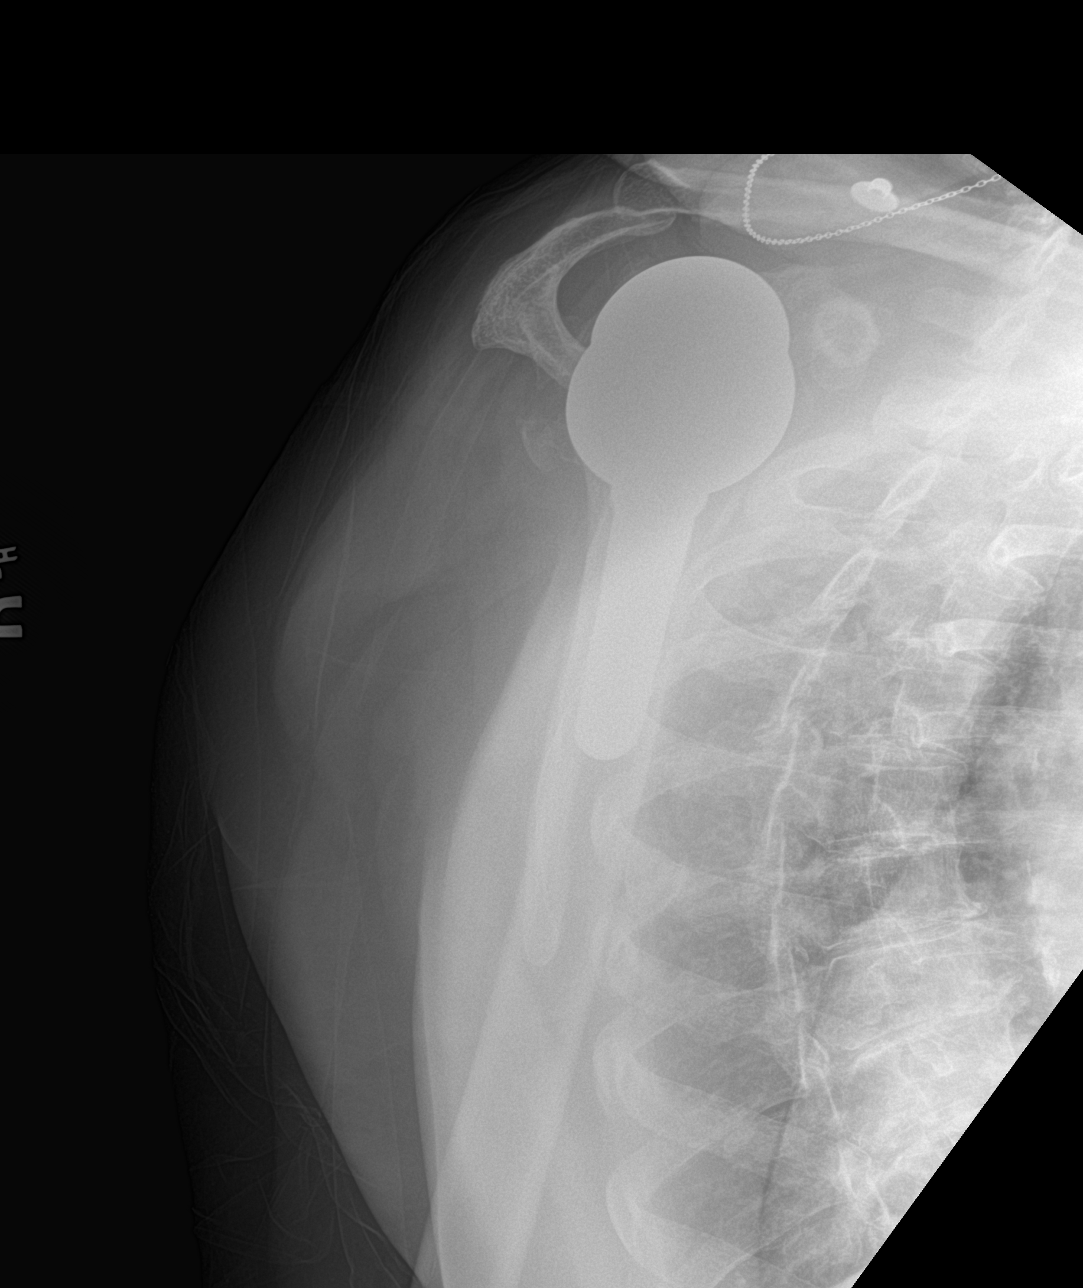

[shoulder ap]
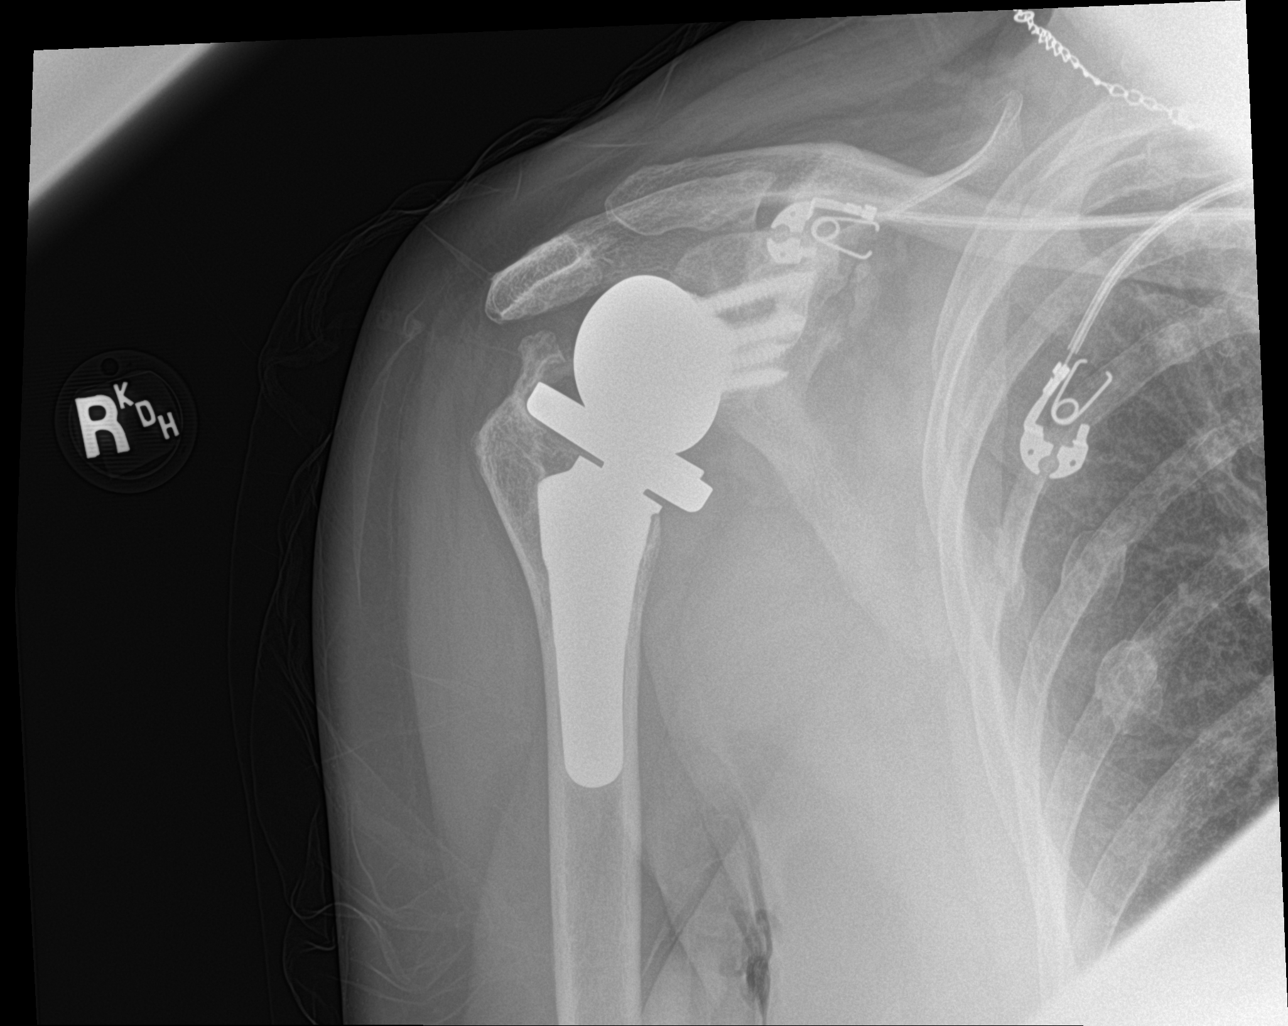

[shoulder obl]
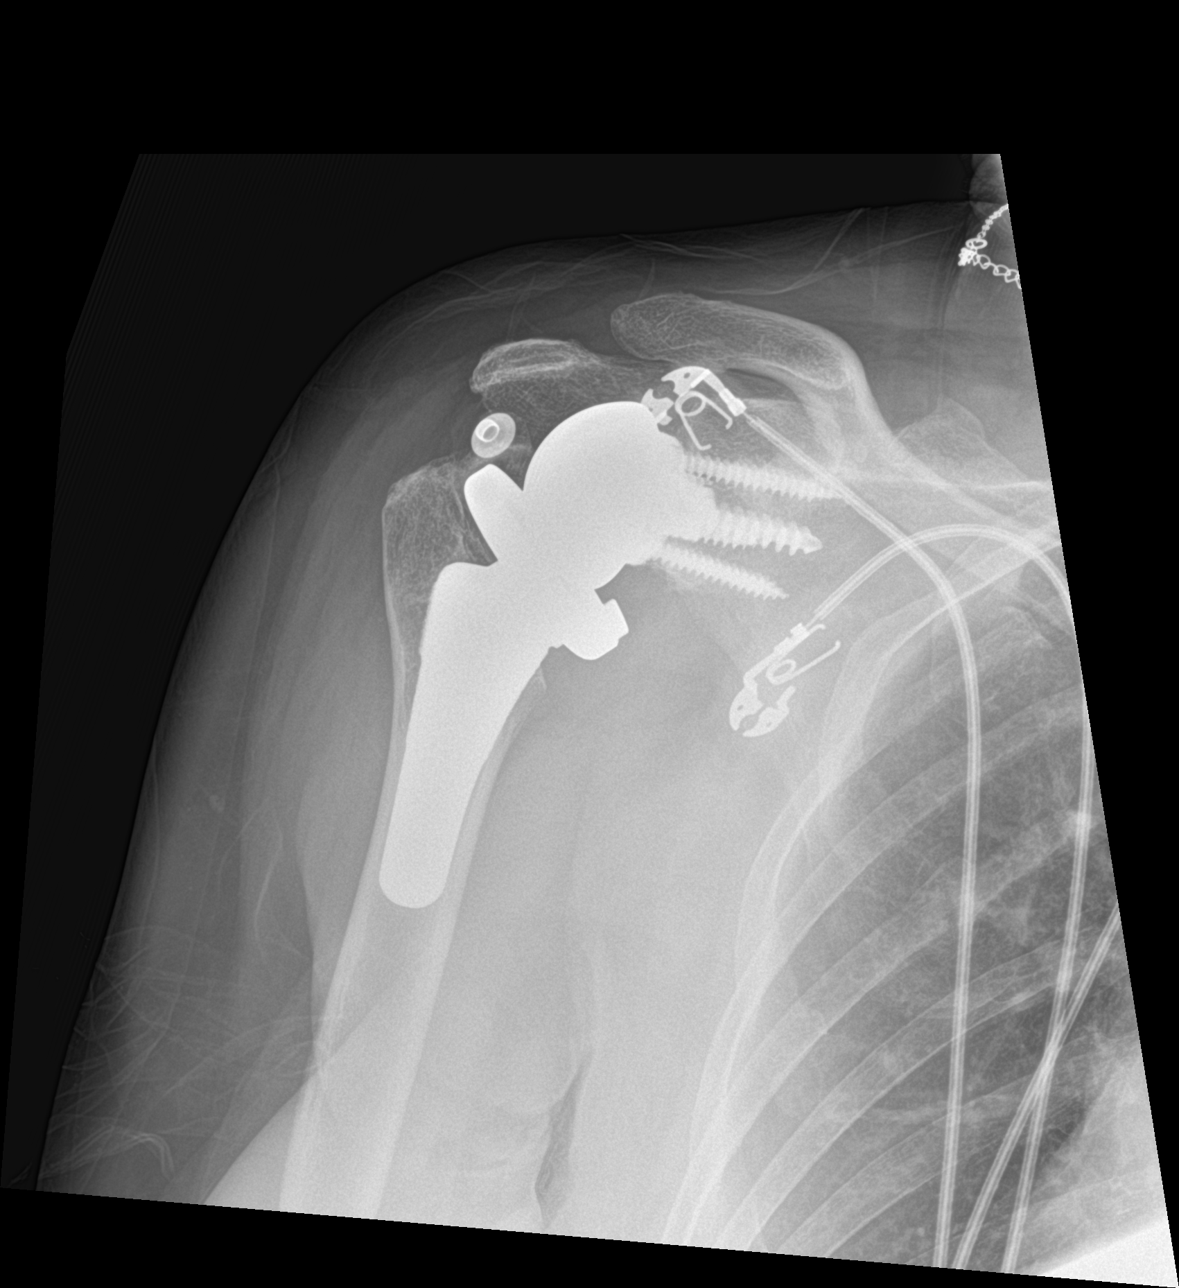

[3 of 3 positions shown; findings below may reference images not displayed]

FINDINGS: Status post right total shoulder arthroplasty. Glenoid and humeral
components are well situated. Old right rib fractures are noted. No
acute fracture or dislocation is noted.
IMPRESSION: No acute abnormality is noted.

## 2022-07-16 IMAGING — DX DG HUMERUS 2V *R*
2 series · 2 of 2 positions shown · non-contrast
Comparison: None Available.

CLINICAL DATA: Right arm pain after fall.

EXAM:
RIGHT HUMERUS - 2+ VIEW

[humerus ap]
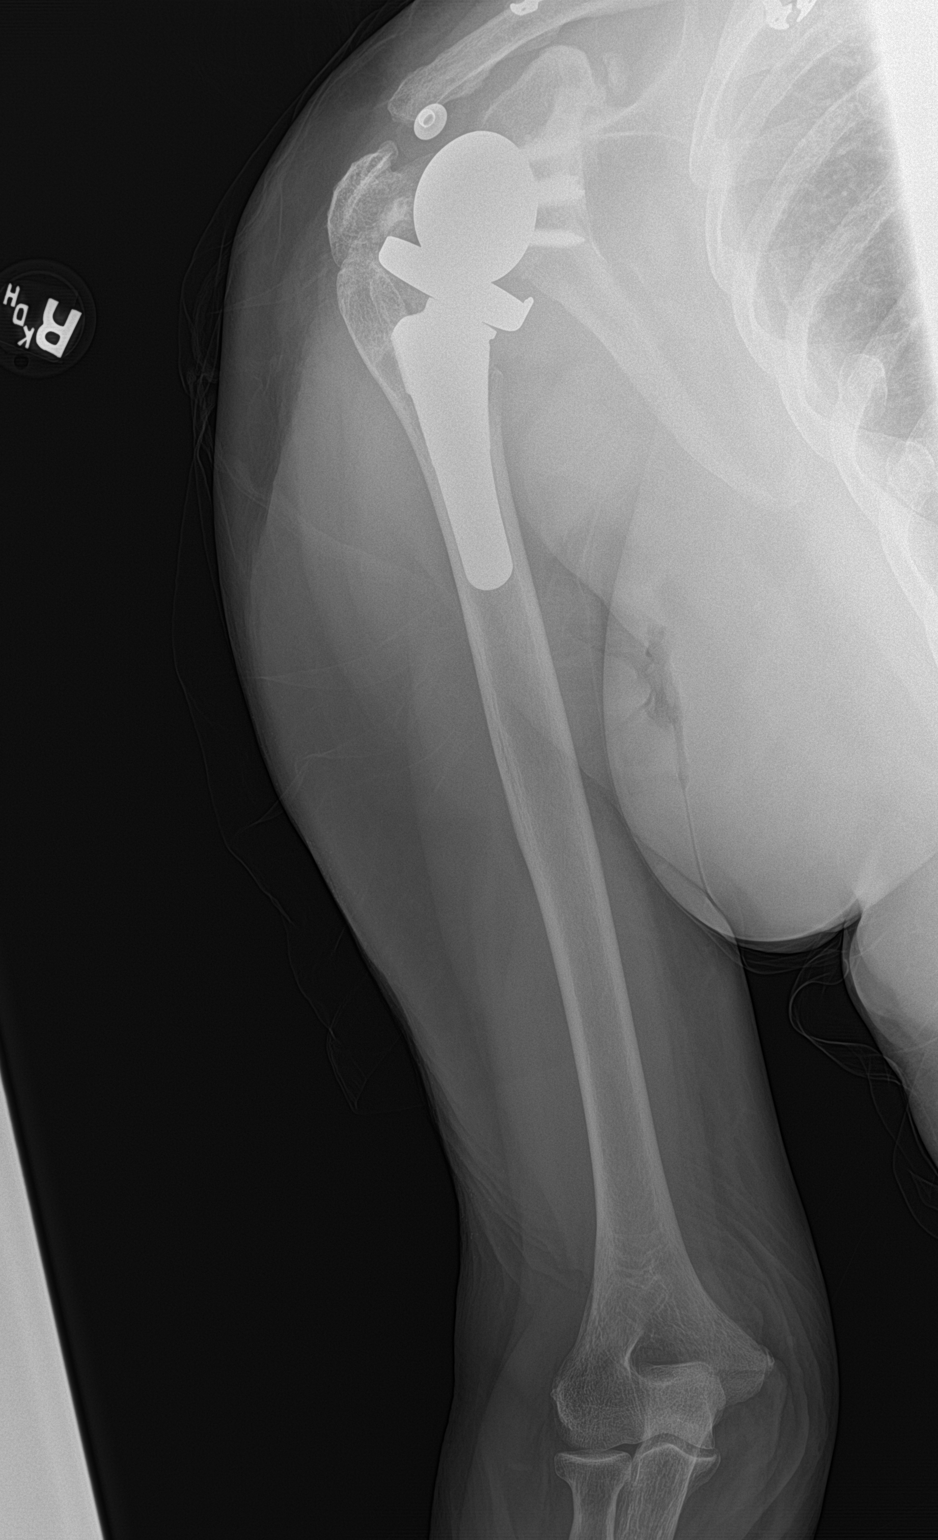

[humerus lat]
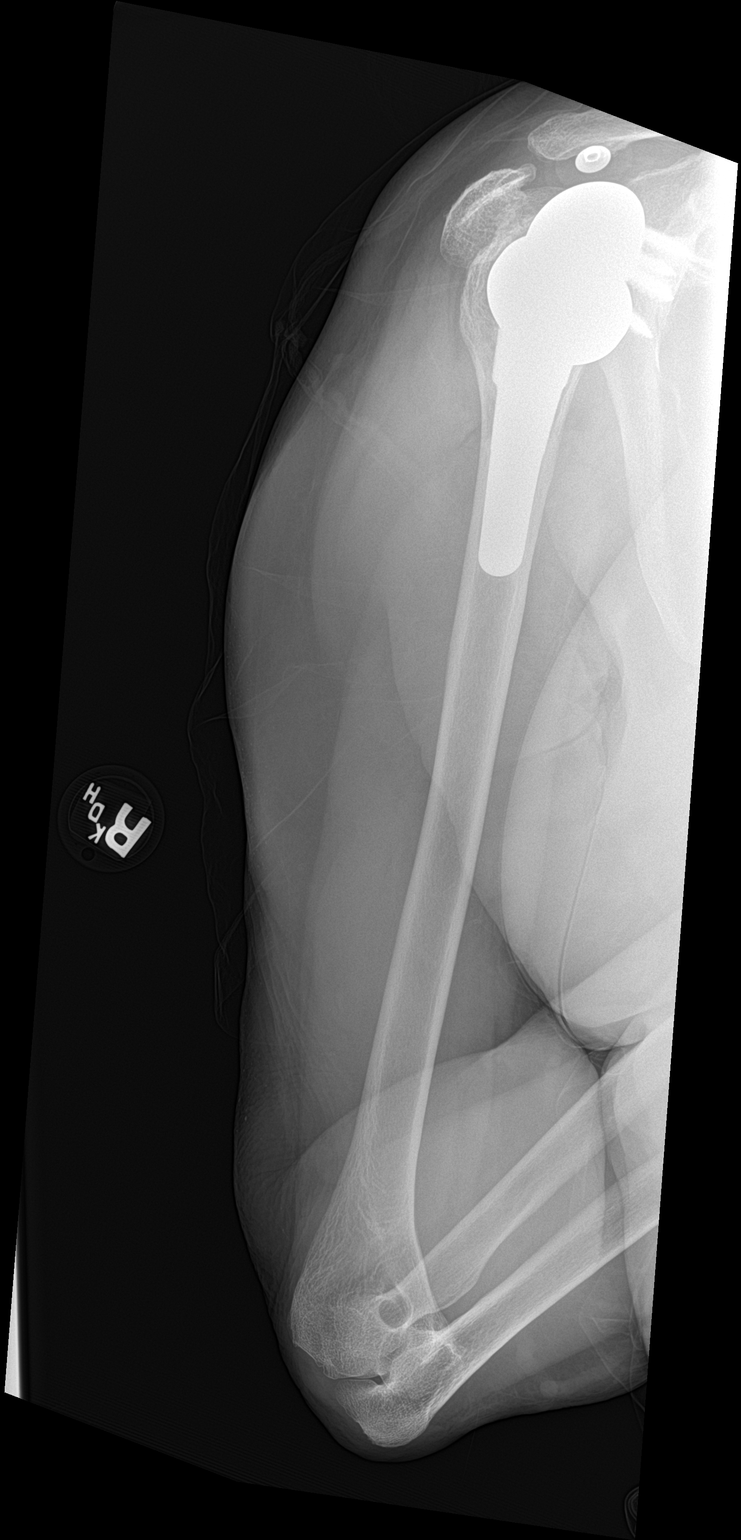

[2 of 2 positions shown; findings below may reference images not displayed]

FINDINGS: Status post right shoulder arthroplasty. Old right rib fractures are
noted. No acute fracture or dislocation is noted.
IMPRESSION: No acute abnormality seen.

## 2022-07-16 IMAGING — CT CT HEAD W/O CM
4 series · 16 of 47 positions shown, 18 images · non-contrast
Comparison: 02/10/2021

CLINICAL DATA: Fall



[Series 2: head wo · axial · 0.45mm/px · z∈[-84,+36]mm · 7 of 34 slices shown, 9 images]
[im 5/34  brain]
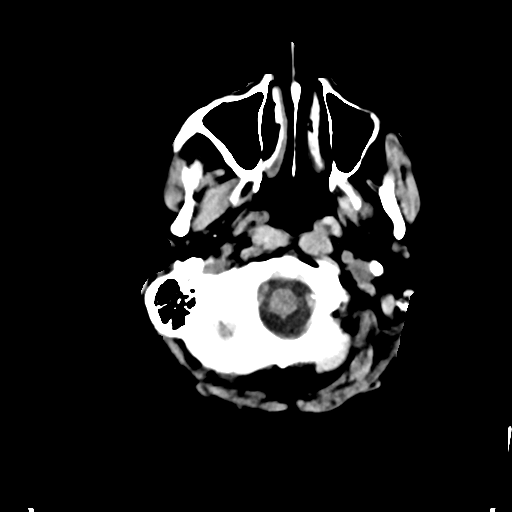
[im 5/34  bone]
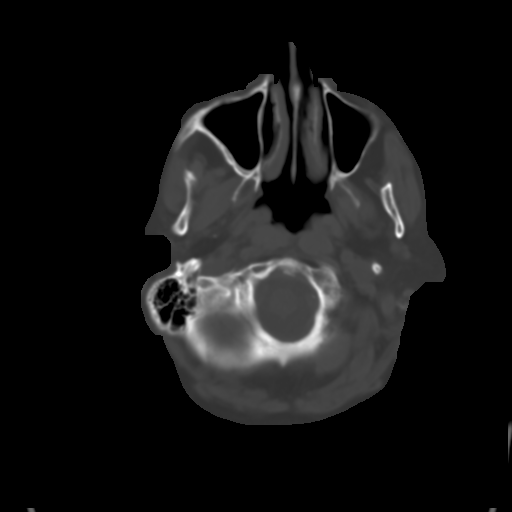
[im 9/34  brain]
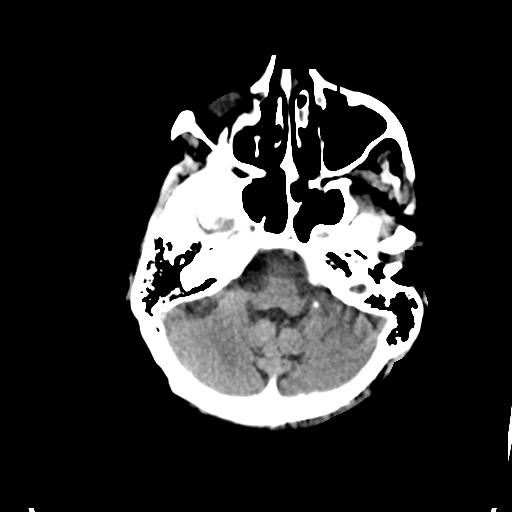
[im 13/34  brain]
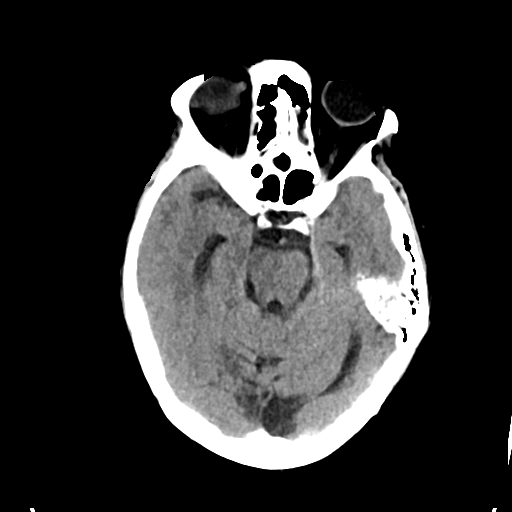
[im 17/34  brain]
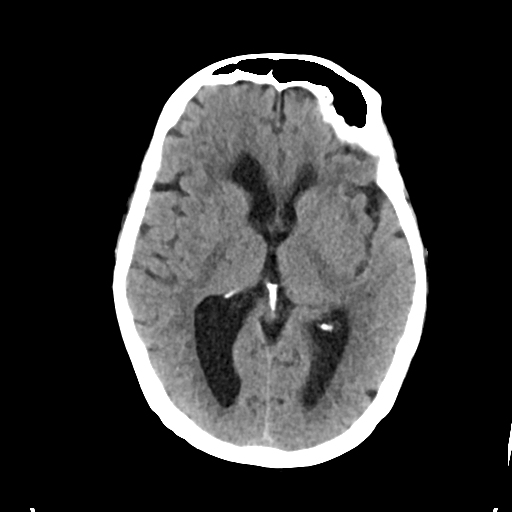
[im 21/34  brain]
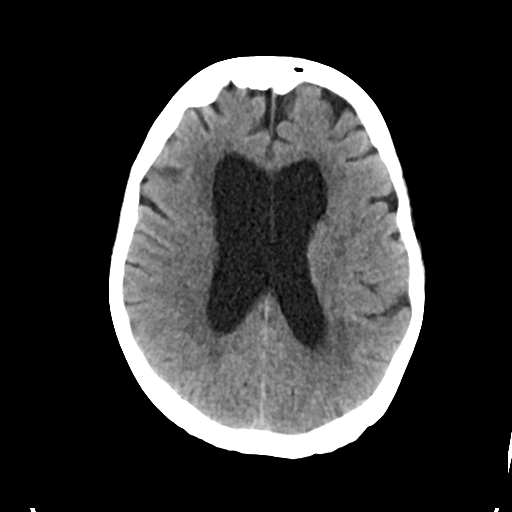
[im 21/34  bone]
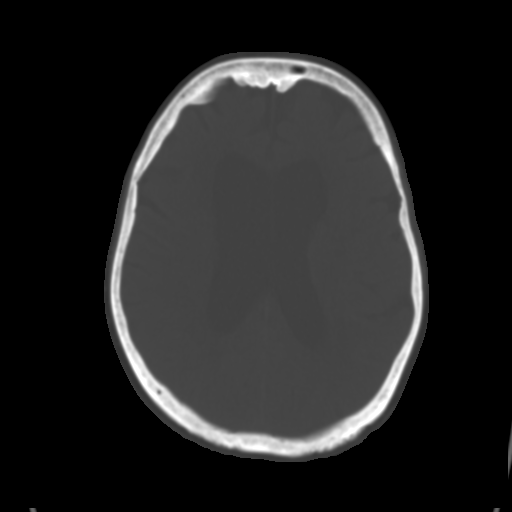
[im 25/34  brain]
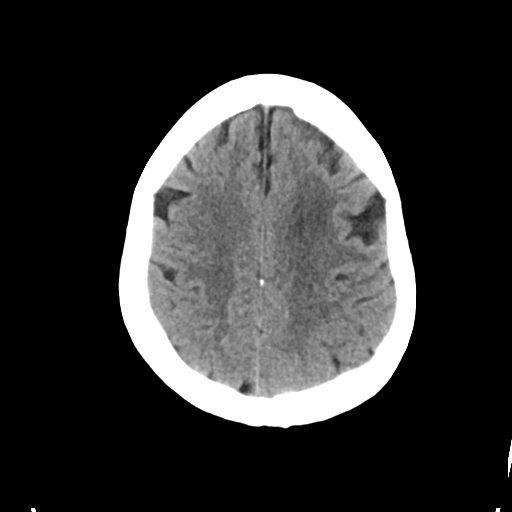
[im 29/34  brain]
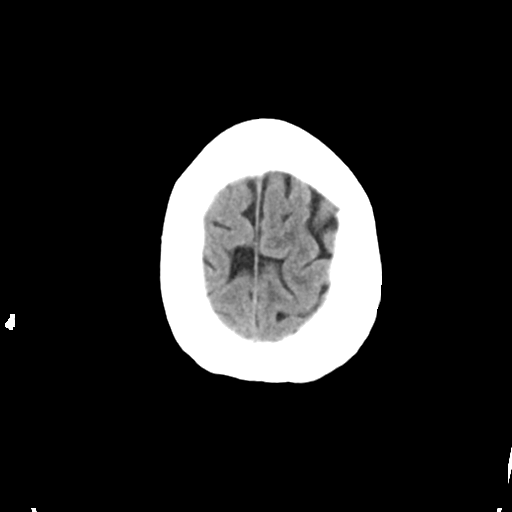

[Series 3: head bone · axial · 0.45mm/px · z∈[-88,-56]mm · 3 of 84 slices shown]
[im 9/84  bone]
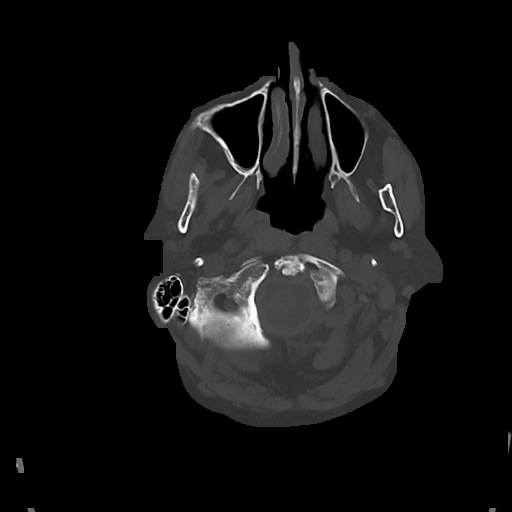
[im 17/84  bone]
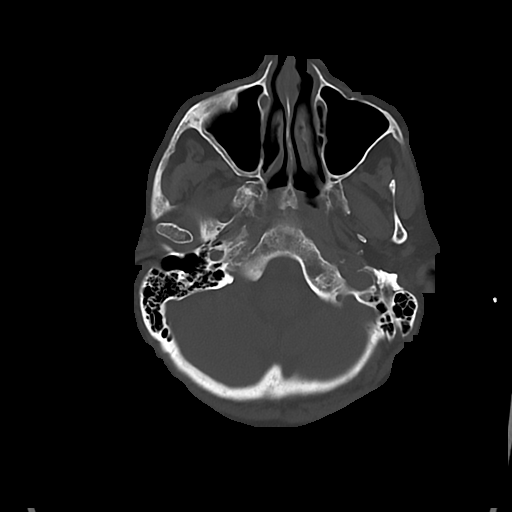
[im 25/84  bone]
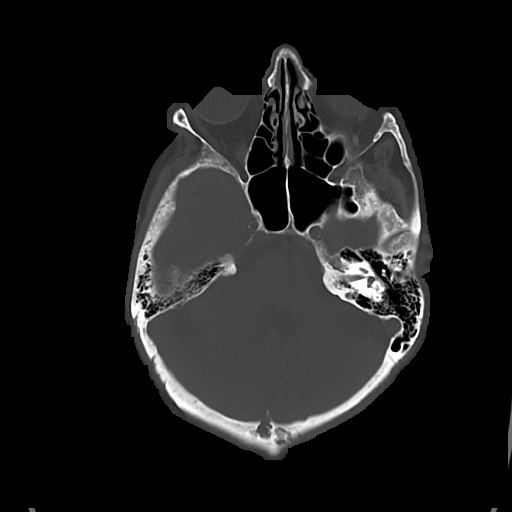

[Series 4: cor soft · coronal · 0.33mm/px · 3 of 67 slices shown]
[im 23/67  brain]
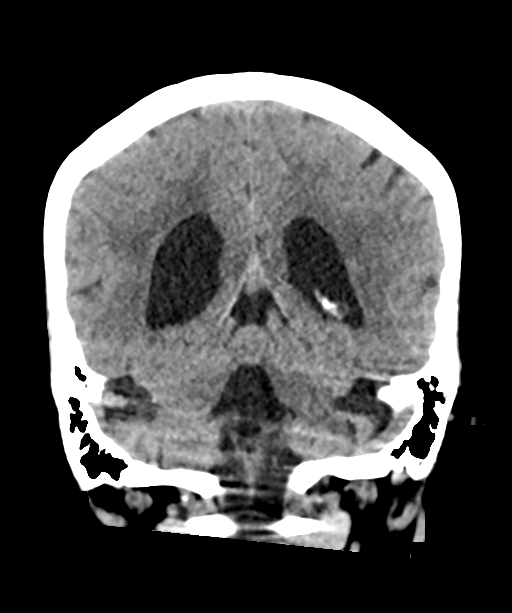
[im 30/67  brain]
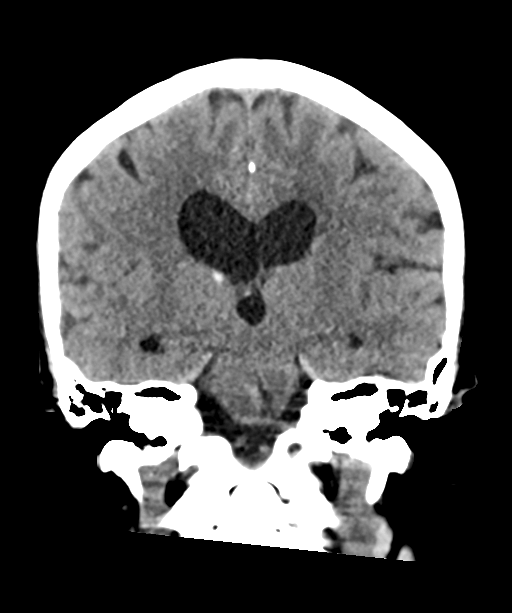
[im 37/67  brain]
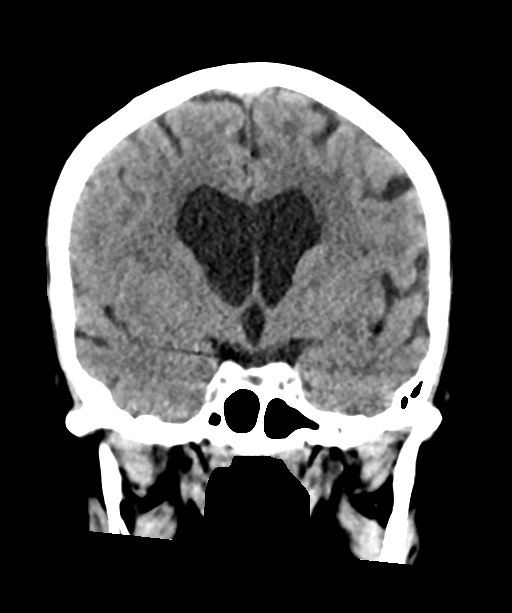

[Series 5: sag soft · sagittal · 0.37mm/px · 3 of 55 slices shown]
[im 19/55  brain]
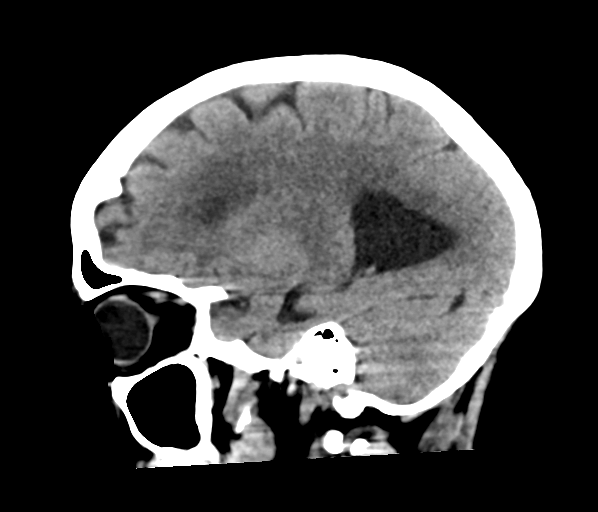
[im 28/55  brain]
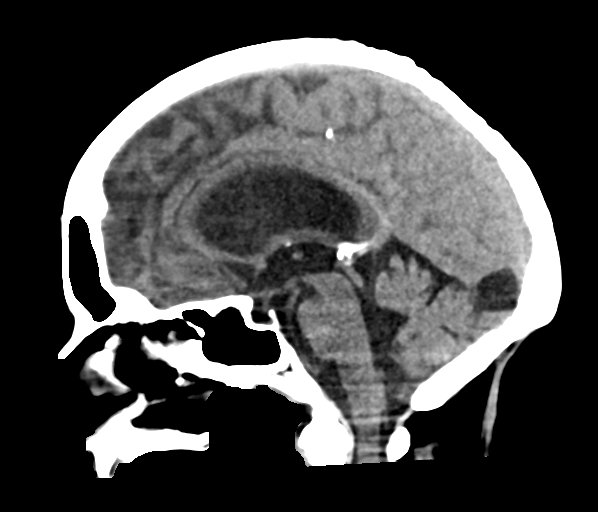
[im 37/55  brain]
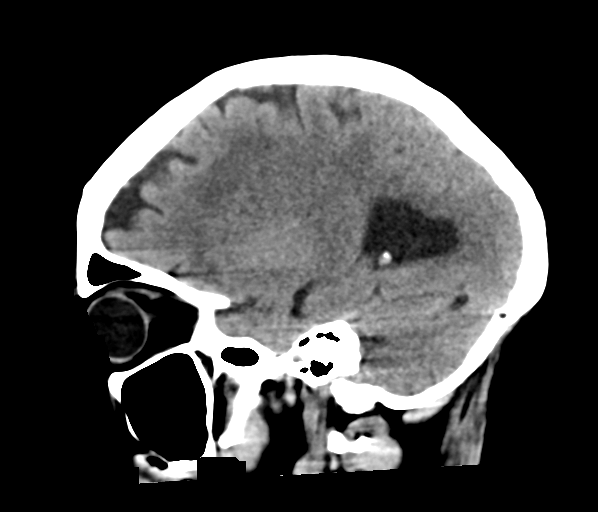

[16 of 47 positions shown; findings below may reference images not displayed]

FINDINGS: Brain: No evidence of acute infarction, hemorrhage, cerebral edema,
mass, mass effect, or midline shift. No extra-axial fluid
collection. Unchanged size and configuration of the ventricles,
favored to be related to cerebral volume loss, although normal
pressure hydrocephalus can appear similar. Periventricular white
matter changes, likely the sequela of chronic small vessel ischemic
disease.

Vascular: No hyperdense vessel. Atherosclerotic calcifications in
the intracranial carotid and vertebral arteries.

Skull: Normal. Negative for fracture or focal lesion.

Sinuses/Orbits: Mucosal thickening in the left-greater-than-right
ethmoid air cells. Status post bilateral lens replacements.

Other: The mastoid air cells are well aerated.
IMPRESSION: No acute intracranial process.

## 2022-07-16 MED ORDER — ACETAMINOPHEN 325 MG PO TABS
650.0000 mg | ORAL_TABLET | Freq: Four times a day (QID) | ORAL | Status: DC | PRN
  Administered 2022-07-17 – 2022-07-18 (×4): 650 mg via ORAL
  Filled 2022-07-16 (×4): qty 2

## 2022-07-16 MED ORDER — ALBUTEROL SULFATE (2.5 MG/3ML) 0.083% IN NEBU
2.5000 mg | INHALATION_SOLUTION | RESPIRATORY_TRACT | Status: DC | PRN
  Filled 2022-07-16: qty 3

## 2022-07-16 MED ORDER — GABAPENTIN 300 MG PO CAPS
300.0000 mg | ORAL_CAPSULE | Freq: Every day | ORAL | Status: DC
  Administered 2022-07-17 – 2022-07-19 (×3): 300 mg via ORAL
  Filled 2022-07-16 (×3): qty 1

## 2022-07-16 MED ORDER — FUROSEMIDE 10 MG/ML IJ SOLN
60.0000 mg | Freq: Once | INTRAMUSCULAR | Status: AC
  Administered 2022-07-16: 60 mg via INTRAVENOUS
  Filled 2022-07-16: qty 8

## 2022-07-16 MED ORDER — LAMOTRIGINE 100 MG PO TABS
100.0000 mg | ORAL_TABLET | Freq: Two times a day (BID) | ORAL | Status: DC
  Administered 2022-07-16 – 2022-07-19 (×6): 100 mg via ORAL
  Filled 2022-07-16 (×6): qty 1

## 2022-07-16 MED ORDER — GABAPENTIN 300 MG PO CAPS: 300.0000 mg | ORAL_CAPSULE | Freq: Three times a day (TID) | ORAL | Status: DC

## 2022-07-16 MED ORDER — LEFLUNOMIDE 10 MG PO TABS
20.0000 mg | ORAL_TABLET | Freq: Every day | ORAL | Status: DC
  Administered 2022-07-17 – 2022-07-19 (×3): 20 mg via ORAL
  Filled 2022-07-16 (×2): qty 1
  Filled 2022-07-16 (×2): qty 2
  Filled 2022-07-16: qty 1

## 2022-07-16 MED ORDER — NEBIVOLOL HCL 5 MG PO TABS
5.0000 mg | ORAL_TABLET | Freq: Two times a day (BID) | ORAL | Status: DC
  Administered 2022-07-17 – 2022-07-19 (×5): 5 mg via ORAL
  Filled 2022-07-16 (×5): qty 1

## 2022-07-16 MED ORDER — LEVOFLOXACIN IN D5W 500 MG/100ML IV SOLN
500.0000 mg | Freq: Once | INTRAVENOUS | Status: DC
  Filled 2022-07-16: qty 100

## 2022-07-16 MED ORDER — QUETIAPINE FUMARATE 25 MG PO TABS
25.0000 mg | ORAL_TABLET | Freq: Every day | ORAL | Status: DC
  Administered 2022-07-16 – 2022-07-18 (×3): 25 mg via ORAL
  Filled 2022-07-16 (×3): qty 1

## 2022-07-16 MED ORDER — SODIUM CHLORIDE 0.9% FLUSH
3.0000 mL | Freq: Two times a day (BID) | INTRAVENOUS | Status: DC
  Administered 2022-07-16 – 2022-07-19 (×6): 3 mL via INTRAVENOUS

## 2022-07-16 MED ORDER — ESCITALOPRAM OXALATE 10 MG PO TABS
10.0000 mg | ORAL_TABLET | Freq: Every day | ORAL | Status: DC
  Administered 2022-07-17 – 2022-07-19 (×3): 10 mg via ORAL
  Filled 2022-07-16 (×3): qty 1

## 2022-07-16 MED ORDER — UMECLIDINIUM BROMIDE 62.5 MCG/ACT IN AEPB
1.0000 | INHALATION_SPRAY | Freq: Every day | RESPIRATORY_TRACT | Status: DC
  Administered 2022-07-17 – 2022-07-19 (×3): 1 via RESPIRATORY_TRACT
  Filled 2022-07-16 (×2): qty 7

## 2022-07-16 MED ORDER — SUCRALFATE 1 G PO TABS
1.0000 g | ORAL_TABLET | Freq: Two times a day (BID) | ORAL | Status: DC
  Administered 2022-07-16 – 2022-07-19 (×6): 1 g via ORAL
  Filled 2022-07-16 (×6): qty 1

## 2022-07-16 MED ORDER — TORSEMIDE 20 MG PO TABS
40.0000 mg | ORAL_TABLET | Freq: Every day | ORAL | Status: DC
  Administered 2022-07-17 – 2022-07-19 (×3): 40 mg via ORAL
  Filled 2022-07-16 (×3): qty 2

## 2022-07-16 MED ORDER — APIXABAN 5 MG PO TABS
5.0000 mg | ORAL_TABLET | Freq: Two times a day (BID) | ORAL | Status: DC
  Administered 2022-07-16 – 2022-07-19 (×6): 5 mg via ORAL
  Filled 2022-07-16 (×6): qty 1

## 2022-07-16 MED ORDER — METHOCARBAMOL 500 MG PO TABS
500.0000 mg | ORAL_TABLET | Freq: Four times a day (QID) | ORAL | Status: DC | PRN
  Administered 2022-07-17 – 2022-07-19 (×5): 500 mg via ORAL
  Filled 2022-07-16 (×6): qty 1

## 2022-07-16 MED ORDER — BUSPIRONE HCL 10 MG PO TABS
5.0000 mg | ORAL_TABLET | Freq: Two times a day (BID) | ORAL | Status: DC
  Administered 2022-07-16 – 2022-07-19 (×6): 5 mg via ORAL
  Filled 2022-07-16 (×6): qty 1

## 2022-07-16 MED ORDER — BUDESON-GLYCOPYRROL-FORMOTEROL 160-9-4.8 MCG/ACT IN AERO: 2.0000 | INHALATION_SPRAY | Freq: Two times a day (BID) | RESPIRATORY_TRACT | Status: DC

## 2022-07-16 MED ORDER — PREDNISONE 10 MG PO TABS
5.0000 mg | ORAL_TABLET | Freq: Every day | ORAL | Status: DC
  Administered 2022-07-17 – 2022-07-19 (×3): 5 mg via ORAL
  Filled 2022-07-16 (×3): qty 1

## 2022-07-16 MED ORDER — ENOXAPARIN SODIUM 40 MG/0.4ML IJ SOSY
40.0000 mg | PREFILLED_SYRINGE | INTRAMUSCULAR | Status: DC
Start: 2022-07-17 — End: 2022-07-16

## 2022-07-16 MED ORDER — TRAMADOL HCL 50 MG PO TABS
50.0000 mg | ORAL_TABLET | Freq: Two times a day (BID) | ORAL | Status: DC | PRN
  Administered 2022-07-17 – 2022-07-19 (×4): 50 mg via ORAL
  Filled 2022-07-16 (×4): qty 1

## 2022-07-16 MED ORDER — PRAVASTATIN SODIUM 20 MG PO TABS
20.0000 mg | ORAL_TABLET | Freq: Every day | ORAL | Status: DC
  Administered 2022-07-17 – 2022-07-18 (×2): 20 mg via ORAL
  Filled 2022-07-16 (×2): qty 1

## 2022-07-16 MED ORDER — FLUTICASONE FUROATE-VILANTEROL 200-25 MCG/ACT IN AEPB
1.0000 | INHALATION_SPRAY | Freq: Every day | RESPIRATORY_TRACT | Status: DC
  Administered 2022-07-17 – 2022-07-19 (×3): 1 via RESPIRATORY_TRACT
  Filled 2022-07-16 (×2): qty 28

## 2022-07-16 MED ORDER — MIRABEGRON ER 25 MG PO TB24
25.0000 mg | ORAL_TABLET | Freq: Every day | ORAL | Status: DC
  Administered 2022-07-17 – 2022-07-19 (×3): 25 mg via ORAL
  Filled 2022-07-16 (×3): qty 1

## 2022-07-16 MED ORDER — MONTELUKAST SODIUM 10 MG PO TABS
10.0000 mg | ORAL_TABLET | Freq: Every day | ORAL | Status: DC
  Administered 2022-07-17 – 2022-07-19 (×3): 10 mg via ORAL
  Filled 2022-07-16 (×3): qty 1

## 2022-07-16 MED ORDER — ACETAMINOPHEN 650 MG RE SUPP: 650.0000 mg | Freq: Four times a day (QID) | RECTAL | Status: DC | PRN

## 2022-07-16 MED ORDER — PANTOPRAZOLE SODIUM 40 MG PO TBEC
40.0000 mg | DELAYED_RELEASE_TABLET | Freq: Every day | ORAL | Status: DC
  Administered 2022-07-17 – 2022-07-18 (×2): 40 mg via ORAL
  Filled 2022-07-16 (×2): qty 1

## 2022-07-16 MED ORDER — GABAPENTIN 300 MG PO CAPS
600.0000 mg | ORAL_CAPSULE | Freq: Every day | ORAL | Status: DC
  Administered 2022-07-16 – 2022-07-18 (×3): 600 mg via ORAL
  Filled 2022-07-16 (×3): qty 2

## 2022-07-16 NOTE — ED Triage Notes (Signed)
Pt here from home via [facility] with SOB with exertion. Vitals WNL and stable.   95% 3L BNC

## 2022-07-16 NOTE — Assessment & Plan Note (Signed)
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  Patient is on Enbrel weekly at home.  I will continue patient's leflunomide but hold the Enbrel in the hospital.  Given concern for active infection at this time.  Arthritis is not active at this time

## 2022-07-16 NOTE — Assessment & Plan Note (Addendum)
Patient takes Eliquis at home, I think this can be continued in this hospitalization.  Please note patient ran out of Eliquis recently and has not been taking it at home for the last several weeks.  Continue with nebivolol

## 2022-07-16 NOTE — ED Notes (Signed)
This RN assisted pt to toilet x1 void

## 2022-07-16 NOTE — Assessment & Plan Note (Signed)
Cyst found incidentally on CAT scan.  Patient is not complaining of pain here and there is no way for me to directly palpate this area as it falls below the clavicle.  Given that the patient has anemia, CKD and now has a fracture, I will go ahead and do an anemia panel as well as serum electrophoresis to make sure were not dealing with multiple myeloma.  But it seems like patient has a prior diagnosis of osteoporosis and this might be from that.

## 2022-07-16 NOTE — Assessment & Plan Note (Addendum)
Not associated with acute hypoxia at this time.  However patient does seem to have worsened fluid overload compared to her baseline related to cutting the dose of her her torsemide to 50% the usual dose.  At this time and pneumonia is still under consideration given patient's immunocompromise.  Although my suspicion is low.  The infiltrates seen on the CAT scan are actually better than what they were on September 08, 2021.  Therefore we will check CRP and procalcitonin.  Also will check viral panel.  If all tests are negative I think we can defer antibiotic therapy.  Patient ordered for 1 dose of levofloxacin in the ER tonight. However, declined it due to priro rash.

## 2022-07-16 NOTE — H&P (Signed)
History and Physical    Patient: Kerri Carter ZOX:[id number] DOB: September 12, 1940 DOA: 07/16/2022 DOS: the patient was seen and examined on 07/16/2022 PCP: Dana Allan, MD  Patient coming from: Home  Chief Complaint: Shortness of breath  HPI: Kerri Carter is a 82 y.o. female with medical history as listed below.  It is significant for patient having COPD/asthma overlap syndrome as well as nonspecific interstitial pneumonitis related to rheumatoid arthritis.  Patient is chronically on 3 L/min of supplementary oxygen and generally is able to walk on level ground without shortness of breath.  About 7 days ago, patient reduced her torsemide from her prescribed dose of 40 mg to 20 mg daily because she was going out of town and would not have had access to torsemide refills.  About 5 days ago patient started noticing increasing leg swelling as well as increasing shortness of breath while walking on level ground.  This progressed to the extent that today just turning in bed, sitting up would cause the patient to have tachypnea and shortness of breath.  Patient reports that she is always using several pillows to keep her head elevated during sleeping because she "smothers "if she lays flat to sleep.  Patient has a chronic cough that is dry.  There has been no change in that.  Patient has no chest pain no fever no expectoration.  No chest pain  Patient came to the ER today because of worsening shortness of breath as above.  ER course is notable for patient being put on 4 L/min of oxygen for comfort.  Patient has had a CAT scan done and has been started on levofloxacin.  Of note patient interrupted her apixaban therapy because she ran out of refills of same.  Does not report any leg cramping. Review of Systems: As mentioned in the history of present illness. All other systems reviewed and are negative. Past Medical History:  Diagnosis Date   Acromial process of scapula fracture 12/21/2019   Acute kidney injury  superimposed on chronic kidney disease (HCC) 05/26/2021   Acute on chronic combined systolic (congestive) and diastolic (congestive) heart failure (HCC) 07/28/2021   Acute on chronic combined systolic and diastolic CHF (congestive heart failure) (HCC) 05/26/2021   Acute on chronic respiratory failure with hypoxia (HCC) 09/10/2021   Acute pain of left knee 12/18/2020   Anemia in stage 3a chronic kidney disease (HCC) 10/09/2021   Anxiety    Anxiety 01/20/2019   Anxiety and depression 05/26/2021   Aortic stenosis 07/09/2015   a.) TTE 07/06/2015: EF 55-60%; mild AS with MPG 13.0 mmHg.   Arrhythmia    atrial fibrillation   Assistance needed for mobility 12/18/2020   Asthma    At high risk for falls 03/16/2019   Avascular necrosis of left shoulder due to adverse effect of steroid therapy (HCC) 01/20/2018   Bipolar disorder (HCC)    C. difficile diarrhea 2010   a.) following ABX course during hospital admission   Carotid atherosclerosis, bilateral    a.) Moderate; < 50% stenosis BILATERAL ICAs.   Cataract    a.) s/p BILATERAL extraction   CHF (congestive heart failure) (HCC)    Chicken pox    Chronic left shoulder pain 08/29/2019   Chronic respiratory failure with hypoxia (HCC) 10/01/2021   CKD (chronic kidney disease), stage III (HCC)    a. s/p R nephrectomy./ aneurysm   Closed fracture of acromial process of scapula 10/28/2018   Closed nondisplaced fracture of proximal phalanx of lesser toe  of right foot    Collapsed vertebra, not elsewhere classified, lumbar region, initial encounter for fracture (HCC) 02/21/2019   Community acquired pneumonia 07/22/2021   Complicated grief 03/16/2019   Compression fracture of L4 vertebra (HCC) 02/21/2019   Conversion disorder    Conversion disorder 08/04/2015   COPD (chronic obstructive pulmonary disease) (HCC)    COPD with exacerbation (HCC) 08/20/2021   Cough 05/02/2012   Depression    Dislocation of hip joint prosthesis (HCC) 01/08/2017    Elevated brain natriuretic peptide (BNP) level 12/18/2020   Elevated d-dimer 05/26/2021   Episodic mood disorder (HCC) 04/18/2019   Essential hypertension    Fatigue 07/07/2018   Frequent falls 12/18/2020   GERD (gastroesophageal reflux disease)    H/O cardiac catheterization 02/24/2011   Formatting of this note might be different from the original.  Repeated at [facility] 2/13 and insignificant disease   Hav (hallux abducto valgus), unspecified laterality 12/05/2018   Heart murmur    Hip fracture (HCC) 10/31/2016   Hip pain 12/18/2020   History of fracture of left hip 01/15/2017   History of syncope 08/25/2016   Hyperkalemia 05/26/2021   - The patient was given p.o. Lokelma and IV Lasix.  - We will follow potassium level.   Hyperlipidemia    Hypokalemia 08/25/2016   ILD (interstitial lung disease) (HCC)    mild; 2/2 RA diagnosis   Iliopsoas bursitis of right hip 08/29/2019   Inflammatory arthritis    a. hands/carpal tunnel.  b. Low titer rheumatoid factor. c. Negative anti-CCP antibodies. d. Plaquenil.   Insomnia 07/17/2015   Left foot pain 10/19/2016   Left lower lobe pneumonia 05/26/2021   Leg edema 05/02/2019   Leg pain, bilateral    Leg swelling 12/15/2016   Leukocytosis 10/19/2017   Leukopenia 06/24/2021   Lymphocytosis 07/13/2021   Macular degeneration    Microcytic anemia 08/15/2015   Mixed bipolar I disorder (HCC) 10/09/2015   Nocturnal hypoxemia    Nocturnal hypoxemia 09/09/2011   June 2013 overnight oximetry on 6 centimeters of water CPAP: SpO2 less than 88% 72% of the time   Non-Obstructive CAD    a. 07/2009 Cath ([facility]): nonobs dzs;  b. 03/2011 Cath [facility]): nonobs dzs.   NSIP (nonspecific interstitial pneumonitis) (HCC) 12/08/2019   Osteoarthritis    a. Knees.   Other shoulder lesions, left shoulder 12/20/2017   Other specified postprocedural states 07/20/2013   Overweight (BMI 25.0-29.9) 08/29/2019   PAD (peripheral artery disease) (HCC)    Pain due to  onychomycosis of toenails of both feet 12/05/2018   Pelvic mass in female 03/16/2019   02/21/19 CT pelvis w/o contrast  Cystic left adnexal lesion has enlarged since 2018 where it  measured approximately 1.8 x 1.7 cm. This is not well assessed due  to streak artifact as well as lack of contrast. Ultrasound follow-up  may be helpful in 8-12 weeks      04/05/19 TVUS     IMPRESSION:  Simple appearing cystic structure within the left ovary measuring up  to 2.9 cm. While this has enlarged s   Physical deconditioning 02/27/2021   Pleural effusion on left 12/18/2020   PUD (peptic ulcer disease)    Recurrent falls 09/01/2021   Recurrent pneumonia 07/23/2021   Repeated falls 12/18/2020   Respiratory failure with hypoxia (HCC) 01/12/2021   Rotator cuff tendinitis, right 07/26/2017   S/P lumbar fusion 05/23/2021   S/P right hip fracture    11/01/16 s/p repair   Sepsis due to pneumonia (  HCC) 07/22/2021   Shoulder pain    Sleep apnea    no cpap / minimal   SOB (shortness of breath) 10/13/2011   2011 [facility] eval >> LHC (non-obstructive CAD), RHC (wedge 13, mean PA 26), TTE (LVEF > 55%, mild concentric LVH, mild AR, MR, PR, TR), CTAngio chest unrevealing; "well controlled asthma"  02/2011 TTE [facility]>> NL LVEF, Mild LVH, Mod AR/Mod MR   Spinal stenosis at L4-L5 level    severe with L4/L5 anterolisthesis grade 1 anterolisthesis    Spinal stenosis of lumbar region 09/15/2016   Last Assessment & Plan:   Now post op with continued back and leg pain noted. Is working with pt/ot and surgery was said to have no complications. Bowels are moving and general pain and anxiety is basically baseline.    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   Weakness of both lower extremities 12/18/2020   Past Surgical History:  Procedure Laterality Date   APPENDECTOMY     APPLICATION OF INTRAOPERATIVE CT SCAN N/A 05/23/2021   Procedure: APPLICATION OF INTRAOPERATIVE CT SCAN;  Surgeon: Venetia Night, MD;  Location: [facility] OR[facility];  Service: Neurosurgery;  Laterality: N/A;   BACK SURGERY     lumbar fusion   BUNIONECTOMY Right    CATARACT EXTRACTION, BILATERAL     CESAREAN SECTION     x1   CHOLECYSTECTOMY N/A 05/11/2016   Procedure: LAPAROSCOPIC CHOLECYSTECTOMY;  Surgeon: Lattie Haw, MD;  Location: [facility];  Service: General;  Laterality: N/A;   COLONOSCOPY WITH PROPOFOL N/A 04/02/2016   Procedure: COLONOSCOPY WITH PROPOFOL;  Surgeon: Wyline Mood, MD;  Location: [facility];  Service: Endoscopy;  Laterality: N/A;   ENDOSCOPIC RETROGRADE CHOLANGIOPANCREATOGRAPHY (ERCP) WITH PROPOFOL N/A 05/08/2016   Procedure: ENDOSCOPIC RETROGRADE CHOLANGIOPANCREATOGRAPHY (ERCP) WITH PROPOFOL;  Surgeon: Midge Minium, MD;  Location: [facility];  Service: Endoscopy;  Laterality: N/A;   ERCP     with biliary spincterotomy 05/08/16 Dr. Servando Snare for choledocholithiasis    ESOPHAGEAL DILATION  04/02/2016   Procedure: ESOPHAGEAL DILATION;  Surgeon: Wyline Mood, MD;  Location: [facility];  Service: Endoscopy;;   ESOPHAGOGASTRODUODENOSCOPY (EGD) WITH PROPOFOL N/A 04/02/2016   Procedure: ESOPHAGOGASTRODUODENOSCOPY (EGD) WITH PROPOFOL;  Surgeon: Wyline Mood, MD;  Location: [facility];  Service: Endoscopy;  Laterality: N/A;   HIP ARTHROPLASTY Right 11/01/2016   Procedure: ARTHROPLASTY BIPOLAR HIP (HEMIARTHROPLASTY);  Surgeon: Christena Flake, MD;  Location: [facility];  Service: Orthopedics;  Laterality: Right;    LUMBAR LAMINECTOMY/DECOMPRESSION MICRODISCECTOMY N/A 12/11/2020   Procedure: LEFT L2-3 MICRODISCECTOMY, L3-4 AND L5-S1 DECOMPRESSION;  Surgeon: Venetia Night, MD;  Location: [facility];  Service: Neurosurgery;  Laterality: N/A;   NEPHRECTOMY  1988   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   ORIF SCAPHOID FRACTURE Left 12/21/2019   Procedure: OPEN REDUCTION INTERNAL FIXATION (ORIF) OF LEFT SCAPULAR NONUNION WITH BONE GRAFT;  Surgeon: Christena Flake, MD;  Location: [facility];  Service: Orthopedics;  Laterality: Left;  osteoporosis     noted DEXA 08/19/16    REPLACEMENT TOTAL KNEE Right    REVERSE SHOULDER ARTHROPLASTY Right 11/04/2017   Procedure: REVERSE SHOULDER ARTHROPLASTY;  Surgeon: Christena Flake, MD;  Location: [facility];  Service: Orthopedics;  Laterality: Right;   REVERSE SHOULDER ARTHROPLASTY Left 07/26/2018   Procedure: REVERSE SHOULDER ARTHROPLASTY;  Surgeon: Christena Flake, MD;  Location: [facility];  Service: Orthopedics;  Laterality: Left;   TONSILLECTOMY     TOTAL HIP ARTHROPLASTY  12/10/11   [facility] left hip   TOTAL HIP ARTHROPLASTY Bilateral    TRANSFORAMINAL LUMBAR INTERBODY FUSION (TLIF) WITH PEDICLE SCREW FIXATION 3 LEVEL N/A 05/23/2021   Procedure: OPEN L2-4 TRANSFORAMINAL LUMBAR INTERBODY FUSION (TLIF) WITH L2-5 POSTERIOR SPINAL FUSION;  Surgeon: Venetia Night, MD;  Location: [facility];  Service: Neurosurgery;  Laterality: N/A;   TUBAL LIGATION     Social History:  reports that she quit smoking about 48 years ago. Her smoking use included cigarettes. She has a 10.00 pack-year smoking history. She has never used smokeless tobacco. She reports that she does not drink alcohol and does not use drugs.  Allergies  Allergen Reactions   Ceftin [Cefuroxime Axetil] Anaphylaxis   Lisinopril Anaphylaxis   Sulfa Antibiotics Other (See Comments)    Face swelling   Sulfasalazine Anaphylaxis   Morphine Other (See Comments)    Per patient, low blood pressure  issues that requires action to raise it back up. Can take small infrequent doses   Xarelto [Rivaroxaban] Other (See Comments)    Stomach burning, bleeding, and tar in stool   Adhesive [Tape] Rash    Paper tape and tega derm OK   Antihistamines, Chlorpheniramine-Type Other (See Comments)    Makes pt hyper   Antivert [Meclizine Hcl] Other (See Comments)    Bladder will not empty   Aspirin Other (See Comments)    Sulfasalazine allergy cross reacts   Contrast Media [Iodinated Contrast Media] Rash    she is able to use betadine scrubs.   Decongestant [Pseudoephedrine Hcl] Other (See Comments)    Makes pt hyper   Doxycycline Other (See Comments)    GI upset   Levaquin [Levofloxacin In D5w] Rash   Polymyxin B Other (See Comments)    Facial rash   Tetanus Toxoids Rash and Other (See Comments)    Fever and hot to touch at injection site    Family History  Problem Relation Age of Onset   Rheum arthritis Mother    Asthma Mother    Parkinson's disease Mother    Heart disease Mother    Stroke Mother    Hypertension Mother    Heart attack Father    Heart disease Father    Hypertension Father    Peripheral Artery Disease Father    Diabetes Son    Gout Son    Asthma Sister    Heart disease Sister    Lung cancer Sister    Heart disease Sister    Heart disease Sister    Breast cancer Sister    Heart attack Sister    Heart disease Brother    Heart disease Maternal Grandmother    Diabetes Maternal Grandmother    Colon cancer Maternal Grandmother    Cancer Maternal Grandmother        Hodgkins lymphoma   Heart disease Brother    Alcohol abuse Brother    Depression Brother    Dementia Son     Prior to Admission medications   Medication Sig  Start Date End Date Taking? Authorizing Provider  albuterol (PROVENTIL) (2.5 MG/3ML) 0.083% nebulizer solution Take by nebulization. 12/08/21  Yes [provider]  albuterol (VENTOLIN HFA) 108 (90 Base) MCG/ACT inhaler Inhale 2 puffs  into the lungs every 6 (six) hours as needed for wheezing or shortness of breath. 06/24/21  Yes McLean-Scocuzza, Pasty Spillers, MD  amLODipine (NORVASC) 5 MG tablet Take 1 tablet (5 mg total) by mouth daily. 08/01/21  Yes McLean-Scocuzza, Pasty Spillers, MD  apixaban (ELIQUIS) 5 MG TABS tablet Take 1 tablet (5 mg total) by mouth 2 (two) times daily. 10/15/21  Yes McLean-Scocuzza, Pasty Spillers, MD  Budeson-Glycopyrrol-Formoterol (BREZTRI AEROSPHERE) 160-9-4.8 MCG/ACT AERO Inhale 2 puffs into the lungs in the morning and at bedtime. 05/28/22  Yes Salena Saner, MD  budesonide (PULMICORT) 0.5 MG/2ML nebulizer solution Take 2 mLs (0.5 mg total) by nebulization 2 (two) times daily. 01/01/22 01/01/23 Yes Salena Saner, MD  busPIRone (BUSPAR) 5 MG tablet Take 1 tablet (5 mg total) by mouth 2 (two) times daily. 06/02/21  Yes Lurene Shadow, MD  cetirizine (ZYRTEC) 10 MG tablet TAKE 1 TABLET BY MOUTH EVERYDAY AT BEDTIME 06/08/22  Yes Salena Saner, MD  dicyclomine (BENTYL) 10 MG capsule TAKE 1 CAPSULE (10 MG TOTAL) BY MOUTH 2 (TWO) TIMES DAILY AS NEEDED FOR SPASMS. 11/03/21 10/29/22 Yes Wyline Mood, MD  escitalopram (LEXAPRO) 10 MG tablet Take 1 tablet (10 mg total) by mouth daily. 08/01/21  Yes McLean-Scocuzza, Pasty Spillers, MD  ferrous sulfate 325 (65 FE) MG EC tablet Take 1 tablet (325 mg total) by mouth 2 (two) times daily. Patient taking differently: Take 325 mg by mouth every other day. 09/12/21 09/12/22 Yes Danford, Earl Lites, MD  gabapentin (NEURONTIN) 300 MG capsule Take 300 mg in the morning and 600 mg at night. 01/06/22  Yes Dana Allan, MD  ipratropium-albuterol (DUONEB) 0.5-2.5 (3) MG/3ML SOLN SMARTSIG:3 Milliliter(s) Via Nebulizer Every 6 Hours PRN 03/25/22  Yes [provider]  lamoTRIgine (LAMICTAL) 100 MG tablet TAKE 1 TAB 2 TIMES DAILY. FURTHER REFILLS NEW PSYCH FOR ALL PSYCH MEDS ONLY TEMP SUPPLY FROM PCP 05/02/21  Yes McLean-Scocuzza, Pasty Spillers, MD  leflunomide (ARAVA) 20 MG tablet Take 1 tablet (20 mg  total) by mouth daily. 09/30/17  Yes McLean-Scocuzza, Pasty Spillers, MD  lidocaine (LIDODERM) 5 % Place 1 patch onto the skin 2 (two) times daily as needed. Remove & Discard patch within 12 hours or as directed by MD 11/22/20  Yes McLean-Scocuzza, Pasty Spillers, MD  lovastatin (MEVACOR) 20 MG tablet TAKE 1 TABLET BY MOUTH EVERYDAY AT BEDTIME *STOP TALKING 40MG * 08/01/21  Yes McLean-Scocuzza, Pasty Spillers, MD  methocarbamol (ROBAXIN) 500 MG tablet Take 500 mg by mouth 4 (four) times daily as needed for muscle spasms. Taking twice a day currently 01/23/21  Yes [provider]  mometasone (NASONEX) 50 MCG/ACT nasal spray 1 spray to each nostril twice a day 02/26/22  Yes Salena Saner, MD  montelukast (SINGULAIR) 10 MG tablet TAKE 1 TABLET BY MOUTH EVERY DAY 03/24/21  Yes McLean-Scocuzza, Pasty Spillers, MD  multivitamin-lutein (OCUVITE-LUTEIN) CAPS capsule Take 1 capsule by mouth at bedtime.   Yes [provider]  nebivolol (BYSTOLIC) 5 MG tablet Take 1 tablet (5 mg total) by mouth in the morning and at bedtime. (Note dose changed from 1/2 10 mg bid to 5 mg bid) 08/01/21  Yes McLean-Scocuzza, Pasty Spillers, MD  pantoprazole (PROTONIX) 40 MG tablet TAKE 1 TABLET BY MOUTH 2 TIMES DAILY 30 MIN BEFORE FOOD (  NOTE REDUCTION IN FREQUENCY) 11/03/21  Yes McLean-Scocuzza, Pasty Spillers, MD  predniSONE (DELTASONE) 5 MG tablet TAKE 1 TABLET BY MOUTH EVERY DAY WITH BREAKFAST 02/20/22  Yes Salena Saner, MD  PROLIA 60 MG/ML SOSY injection Inject 60 mg into the skin every 6 (six) months. 03/09/22  Yes [provider]  QUEtiapine (SEROQUEL) 25 MG tablet TAKE 1 TABLET (25 MG TOTAL) BY MOUTH AT BEDTIME. AGAIN LAST FILL FURTHER REFILLS FROM PSYCHIATRY NO EXCEPTIONS 09/13/20  Yes McLean-Scocuzza, Pasty Spillers, MD  sucralfate (CARAFATE) 1 g tablet TAKE 1 TABLET BY MOUTH 2 TIMES DAILY. 11/12/21  Yes Wyline Mood, MD  torsemide (DEMADEX) 20 MG tablet Take 1 tablet (20 mg total) by mouth daily. Patient taking differently: Take 40 mg by mouth  daily. 09/12/21  Yes Danford, Earl Lites, MD  traMADol (ULTRAM) 50 MG tablet Take 1 tablet (50 mg total) by mouth 2 (two) times daily. 04/08/22  Yes Enedina Finner, MD  Vibegron (GEMTESA) 75 MG TABS Take 75 mg by mouth daily. 01/08/22  Yes Vaillancourt, Lelon Mast, PA-C  Cholecalciferol (VITAMIN D3) 50 MCG (2000 UT) TABS Take 4,000 Units by mouth daily. Patient not taking: Reported on 07/16/2022 08/01/21   McLean-Scocuzza, Pasty Spillers, MD  ENBREL SURECLICK 50 MG/ML injection Inject 50 mg into the skin once a week. Takes on Fridays    [provider]  OXYGEN Inhale 3 L into the lungs continuous.    [provider]    Physical Exam: Vitals:   07/16/22 1745 07/16/22 1800 07/16/22 1900 07/16/22 1930  BP: (!) 157/83   98/84  Pulse: 84 (!) 51 78 80  Resp:   19 17  Temp:      TempSrc:      SpO2:      Weight:      Height:       General: Well-appearing lady did not appear to be in any immediate distress on evaluation.  However by the time of closure of this encounter, with exam maneuvers, patient was feeling short of breath and had slight tachypnea. Respiratory exam: Diffusely diminished breath sounds but air entry is vesicular with no expiratory wheezes Cardiovascular exam S1-S2 normal Abdomen soft nontender Extremities there is trace to 1+ edema on bilateral lower extremities, distal function intact, warm Neurologically alert awake oriented x 3 no focal motor deficit.  No skin rash. Data Reviewed:  Labs on Admission:  Results for orders placed or performed during the hospital encounter of 07/16/22 (from the past 24 hour(s))  CBC     Status: Abnormal   Collection Time: 07/16/22  2:37 PM  Result Value Ref Range   WBC 10.0 4.0 - 10.5 K/uL   RBC 4.23 3.87 - 5.11 MIL/uL   Hemoglobin 11.4 (L) 12.0 - 15.0 g/dL   HCT 40.9 81.1 - 91.4 %   MCV 87.2 80.0 - 100.0 fL   MCH 27.0 26.0 - 34.0 pg   MCHC 30.9 30.0 - 36.0 g/dL   RDW 78.2 (H) 95.6 - 21.3 %   Platelets 232 150 - 400 K/uL   nRBC  0.0 0.0 - 0.2 %  Basic metabolic panel     Status: Abnormal   Collection Time: 07/16/22  2:37 PM  Result Value Ref Range   Sodium 139 135 - 145 mmol/L   Potassium 4.9 3.5 - 5.1 mmol/L   Chloride 104 98 - 111 mmol/L   CO2 23 22 - 32 mmol/L   Glucose, Bld 141 (H) 70 - 99 mg/dL   BUN  18 8 - 23 mg/dL   Creatinine, Ser 0.98 (H) 0.44 - 1.00 mg/dL   Calcium 9.1 8.9 - 11.9 mg/dL   GFR, Estimated 46 (L) >60 mL/min   Anion gap 12 5 - 15  Troponin I (High Sensitivity)     Status: None   Collection Time: 07/16/22  2:37 PM  Result Value Ref Range   Troponin I (High Sensitivity) 14 <18 ng/L  Brain natriuretic peptide     Status: Abnormal   Collection Time: 07/16/22  2:37 PM  Result Value Ref Range   B Natriuretic Peptide 446.2 (H) 0.0 - 100.0 pg/mL   *Note: Due to a large number of results and/or encounters for the requested time period, some results have not been displayed. A complete set of results can be found in Results Review.   Basic Metabolic Panel: Recent Labs  Lab 07/16/22 1437  NA 139  K 4.9  CL 104  CO2 23  GLUCOSE 141*  BUN 18  CREATININE 1.21*  CALCIUM 9.1   Liver Function Tests: No results for input(s): "AST", "ALT", "ALKPHOS", "BILITOT", "PROT", "ALBUMIN" in the last 168 hours. No results for input(s): "LIPASE", "AMYLASE" in the last 168 hours. No results for input(s): "AMMONIA" in the last 168 hours. CBC: Recent Labs  Lab 07/16/22 1437  WBC 10.0  HGB 11.4*  HCT 36.9  MCV 87.2  PLT 232   Cardiac Enzymes: Recent Labs  Lab 07/16/22 1437  TROPONINIHS 14    BNP (last 3 results) No results for input(s): "PROBNP" in the last 8760 hours. CBG: No results for input(s): "GLUCAP" in the last 168 hours.  Radiological Exams on Admission:  CT CHEST WO CONTRAST  Addendum Date: 07/16/2022   ADDENDUM REPORT: 07/16/2022 19:56 ADDENDUM: Upon further evaluation, an acute, nondisplaced fracture of the posteromedial first right rib is seen (axial CT images 8 through 14,  CT series 4). This represents a new finding when compared to the prior study. Multiple chronic bilateral posterolateral fractures are also identified. Electronically Signed   By: Aram Candela M.D.   On: 07/16/2022 19:56   Result Date: 07/16/2022 CLINICAL DATA:  Shortness of breath with exertion. EXAM: CT CHEST WITHOUT CONTRAST TECHNIQUE: Multidetector CT imaging of the chest was performed following the standard protocol without IV contrast. RADIATION DOSE REDUCTION: This exam was performed according to the departmental dose-optimization program which includes automated exposure control, adjustment of the mA and/or kV according to patient size and/or use of iterative reconstruction technique. COMPARISON:  September 08, 2021 FINDINGS: Cardiovascular: There is moderate severity calcification of the thoracic aorta, without evidence of aortic aneurysm. Normal heart size with marked severity coronary artery calcification. No pericardial effusion. Mediastinum/Nodes: No enlarged mediastinal or axillary lymph nodes. Thyroid gland, trachea, and esophagus demonstrate no significant findings. Lungs/Pleura: Mild diffuse interstitial thickening is seen involving predominantly the bilateral upper lobes and bilateral apices. Mild, hazy posterior right upper lobe, posteromedial right lower lobe and posterior left lower lobe infiltrates are seen. This is decreased in severity when compared to the prior study. Mild, adjacent areas of right upper lobe and bilateral lower lobe scarring and/or atelectasis are seen. There are small bilateral pleural effusions. No pneumothorax is identified. Upper Abdomen: No acute abnormality. Musculoskeletal: Bilateral shoulder replacements are seen. There is exaggerated kyphosis of the thoracic spine with multilevel degenerative changes. IMPRESSION: 1. Mild diffuse interstitial thickening with mild right upper lobe, posteromedial right lower lobe and posterior left lower lobe infiltrates. 2. Small  bilateral pleural effusions. 3. Marked severity  coronary artery calcification. 4. Aortic atherosclerosis. Aortic Atherosclerosis (ICD10-I70.0). Electronically Signed: By: Aram Candela M.D. On: 07/16/2022 18:44   DG Chest 2 View  Result Date: 07/16/2022 CLINICAL DATA:  sob EXAM: CHEST - 2 VIEW COMPARISON:  CXR 06/04/22 FINDINGS: Bilateral shoulder arthroplasties. Small bilateral pleural effusions. Unchanged cardiac contours. There is asymmetric prominence of the right hilum/infrahilar region, which could represent an underlying airspace opacity redemonstrated chronic right-sided rib fractures. No definite radiographic evidence of a new displaced rib fracture. Visualized upper abdomen is unremarkable. Vertebral body heights are maintained. Partially visualized thoracolumbar spinal fusion hardware in place. IMPRESSION: 1.  Small bilateral pleural effusions, unchanged. 2. Possible focal opacity in the right infrahilar region, which could represent atelectasis or infection. Electronically Signed   By: Lorenza Cambridge M.D.   On: 07/16/2022 15:30    EKG: Independently reviewed. afib   Assessment and Plan: * Fluid overload Related to patient cutting down her torsemide dose about a week ago to 50% of her recommended dose.  Patient getting 60 mg of IV Lasix tonight.  I will resume patient's usual dose of 40 mg torsemide starting tomorrow.  Monitor intake output and daily weight.  I will put in patient for ambulatory pulse oximetry in AM.  Paroxysmal atrial fibrillation (HCC) Patient takes Eliquis at home, I think this can be continued in this hospitalization.  Please note patient ran out of Eliquis recently and has not been taking it at home for the last several weeks.  Continue with nebivolol  Rheumatoid arthritis (HCC) This is associated with nonspecific interstitial pneumonitis per last pulmonary note.  Patient is on Enbrel weekly at home.  I will continue patient's leflunomide but hold the Enbrel in the  hospital.  Given concern for active infection at this time.  Arthritis is not active at this time  Right rib fracture Cyst found incidentally on CAT scan.  Patient is not complaining of pain here and there is no way for me to directly palpate this area as it falls below the clavicle.  Given that the patient has anemia, CKD and now has a fracture, I will go ahead and do an anemia panel as well as serum electrophoresis to make sure were not dealing with multiple myeloma.  But it seems like patient has a prior diagnosis of osteoporosis and this might be from that.  Shortness of breath Not associated with acute hypoxia at this time.  However patient does seem to have worsened fluid overload compared to her baseline related to cutting the dose of her her torsemide to 50% the usual dose.  At this time and pneumonia is still under consideration given patient's immunocompromise.  Although my suspicion is low.  The infiltrates seen on the CAT scan are actually better than what they were on September 08, 2021.  Therefore we will check CRP and procalcitonin.  Also will check viral panel.  If all tests are negative I think we can defer antibiotic therapy.  Patient ordered for 1 dose of levofloxacin in the ER tonight. However, declined it due to priro rash.      Advance Care Planning:   Code Status: DNR   Consults: none  Family Communication: per patient. She is cognizant. Son was at the bedside priro to this encounter.  Severity of Illness: The appropriate patient status for this patient is OBSERVATION. Observation status is judged to be reasonable and necessary in order to provide the required intensity of service to ensure the patient's safety. The patient's presenting symptoms,  physical exam findings, and initial radiographic and laboratory data in the context of their medical condition is felt to place them at decreased risk for further clinical deterioration. Furthermore, it is anticipated that the patient  will be medically stable for discharge from the hospital within 2 midnights of admission.   Author: Nolberto Hanlon, MD 07/16/2022 10:24 PM  For on call review [email or web address].

## 2022-07-16 NOTE — Assessment & Plan Note (Signed)
Related to patient cutting down her torsemide dose about a week ago to 50% of her recommended dose.  Patient getting 60 mg of IV Lasix tonight.  I will resume patient's usual dose of 40 mg torsemide starting tomorrow.  Monitor intake output and daily weight.  I will put in patient for ambulatory pulse oximetry in AM.

## 2022-07-16 NOTE — ED Provider Notes (Signed)
[facility] Provider Note    Event Date/Time   First MD Initiated Contact with Patient 07/16/22 1657     (approximate)  History   Chief Complaint: Dyspnea  HPI  Kerri Carter is a 82 y.o. female with a past medical history of anxiety, CHF on torsemide, bipolar, CKD, COPD on 3 L of nasal cannula oxygen chronically, who presents to the emergency department for shortness of breath.  According to the patient over the past 1 week she has had worsening shortness of breath especially with exertion states occasional chest pain at times as well.  She is also noted over the past several days increased lower extremity edema.  Denies any chest pain currently.  She is on 3 L currently in the emergency department satting in the mid 90s this is the patient's baseline O2 requirement.  Physical Exam   Triage Vital Signs: ED Triage Vitals  Enc Vitals Group     BP 07/16/22 1431 (!) 142/61     Pulse Rate 07/16/22 1431 78     Resp 07/16/22 1431 17     Temp 07/16/22 1431 98.2 F (36.8 C)     Temp Source 07/16/22 1431 Oral     SpO2 07/16/22 1431 95 %     Weight 07/16/22 1421 171 lb 15.3 oz (78 kg)     Height 07/16/22 1421 4' 11.5" (1.511 m)     Head Circumference --      Peak Flow --      Pain Score 07/16/22 1421 0     Pain Loc --      Pain Edu? --      Excl. in GC? --     Most recent vital signs: Vitals:   07/16/22 1431  BP: (!) 142/61  Pulse: 78  Resp: 17  Temp: 98.2 F (36.8 C)  SpO2: 95%    General: Awake, no distress.  CV:  Good peripheral perfusion.  Regular rate and rhythm  Resp:  Normal effort.  Equal breath sounds bilaterally.  Abd:  No distention.  Soft, nontender.  No rebound or guarding. Other:  Patient has moderate bilateral pedal edema.  Warm and neurovascular intact distally.   ED Results / Procedures / Treatments   EKG  EKG viewed and interpreted by myself shows atrial fibrillation at 77 bpm with a widened QRS, normal axis, largely normal  intervals with nonspecific ST changes without ST elevation.  RADIOLOGY  I have reviewed and interpreted the chest x-ray images.  No obvious consolidation seen on my evaluation. Radiology has read the x-ray as small bilateral pleural effusions that are unchanged from baseline, possible focal opacity in the right infrahilar region.   MEDICATIONS ORDERED IN ED: Medications - No data to display   IMPRESSION / MDM / ASSESSMENT AND PLAN / ED COURSE  I reviewed the triage vital signs and the nursing notes.  Patient's presentation is most consistent with acute presentation with potential threat to life or bodily function.  Patient presents to the emergency department for worsening shortness of breath especially with exertion as well as intermittent chest pain over the past week or so.  She is also noticed increased swelling around both her ankles and feet.  Differential would include worsening CHF, pulmonary edema, pneumonia, ACS, COPD.  Patient satting well on 3 L oxygen which is her baseline O2 requirement.  Patient's lab work shows a reassuring CBC with a normal white blood cell count, reassuring chemistry with chronic renal insufficiency.  I have added on a troponin and a BNP.  Patient's chest x-ray is fairly equivocal showing possible infection versus atelectasis versus edema we will obtain CT imaging of the chest to further evaluate.  Patient agreeable to plan of care.  Patient's lab work is reassuring normal CBC reassuring chemistry troponin is negative BNP is mildly elevated 446, however not significantly changed from historical values.  CT scan of the chest shows mild interstitial thickening concerning for infiltrates.  Small bilateral pleural effusions.  Patient is on 4 L of oxygen outside of her baseline 3 L but still satting 94 to 95%.  Patient states she is unable to walk due to significant shortness of breath with exertion.  Given the patient's symptoms and CT findings we will start the  patient on IV antibiotics and blood cultures as well as IV Lasix.  Will admit to the hospital service for further workup and treatment.  Patient agreeable to plan of care.  FINAL CLINICAL IMPRESSION(S) / ED DIAGNOSES   Dyspnea on exertion CHF exacerbation Pneumonia   Note:  This document was prepared using Dragon voice recognition software and may include unintentional dictation errors.   Minna Antis, MD 07/16/22 2059

## 2022-07-17 ENCOUNTER — Inpatient Hospital Stay: Payer: Medicare PPO | Attending: Oncology

## 2022-07-17 ENCOUNTER — Encounter: Payer: Self-pay | Admitting: Internal Medicine

## 2022-07-17 DIAGNOSIS — E8779 Other fluid overload: Secondary | ICD-10-CM | POA: Diagnosis not present

## 2022-07-17 DIAGNOSIS — M069 Rheumatoid arthritis, unspecified: Secondary | ICD-10-CM | POA: Diagnosis not present

## 2022-07-17 DIAGNOSIS — I480 Paroxysmal atrial fibrillation: Secondary | ICD-10-CM | POA: Diagnosis not present

## 2022-07-17 DIAGNOSIS — N1831 Chronic kidney disease, stage 3a: Secondary | ICD-10-CM

## 2022-07-17 DIAGNOSIS — I5033 Acute on chronic diastolic (congestive) heart failure: Secondary | ICD-10-CM

## 2022-07-17 LAB — RESPIRATORY PANEL BY PCR

## 2022-07-17 LAB — RETICULOCYTES
Immature Retic Fract: 27.9 % — ABNORMAL HIGH (ref 2.3–15.9)
RBC.: 4.16 MIL/uL (ref 3.87–5.11)
Retic Count, Absolute: 70.7 10*3/uL (ref 19.0–186.0)
Retic Ct Pct: 1.7 % (ref 0.4–3.1)

## 2022-07-17 LAB — IRON AND TIBC
Iron: 79 ug/dL (ref 28–170)
Saturation Ratios: 21 % (ref 10.4–31.8)
TIBC: 379 ug/dL (ref 250–450)
UIBC: 300 ug/dL

## 2022-07-17 LAB — BASIC METABOLIC PANEL
Anion gap: 12 (ref 5–15)
BUN: 17 mg/dL (ref 8–23)
CO2: 26 mmol/L (ref 22–32)
Calcium: 8.8 mg/dL — ABNORMAL LOW (ref 8.9–10.3)
Chloride: 101 mmol/L (ref 98–111)
Creatinine, Ser: 1.16 mg/dL — ABNORMAL HIGH (ref 0.44–1.00)
GFR, Estimated: 48 mL/min — ABNORMAL LOW (ref >60)
Glucose, Bld: 91 mg/dL (ref 70–99)
Potassium: 3.5 mmol/L (ref 3.5–5.1)
Sodium: 139 mmol/L (ref 135–145)

## 2022-07-17 LAB — HEPATIC FUNCTION PANEL
ALT: 16 U/L (ref 0–44)
AST: 23 U/L (ref 15–41)
Albumin: 3.6 g/dL (ref 3.5–5.0)
Alkaline Phosphatase: 81 U/L (ref 38–126)
Bilirubin, Direct: <0.1 mg/dL (ref 0.0–0.2)
Total Bilirubin: 0.7 mg/dL (ref 0.3–1.2)
Total Protein: 7.1 g/dL (ref 6.5–8.1)

## 2022-07-17 LAB — FOLATE: Folate: >40 ng/mL (ref >5.9)

## 2022-07-17 LAB — CULTURE, BLOOD (ROUTINE X 2)

## 2022-07-17 LAB — C-REACTIVE PROTEIN: CRP: 0.9 mg/dL (ref <1.0)

## 2022-07-17 LAB — APTT: aPTT: 30 seconds (ref 24–36)

## 2022-07-17 LAB — CBC
HCT: 35 % — ABNORMAL LOW (ref 36.0–46.0)
Hemoglobin: 11 g/dL — ABNORMAL LOW (ref 12.0–15.0)
MCH: 27.3 pg (ref 26.0–34.0)
MCHC: 31.4 g/dL (ref 30.0–36.0)
MCV: 86.8 fL (ref 80.0–100.0)
Platelets: 208 10*3/uL (ref 150–400)
RBC: 4.03 MIL/uL (ref 3.87–5.11)
RDW: 15.6 % — ABNORMAL HIGH (ref 11.5–15.5)
WBC: 9 10*3/uL (ref 4.0–10.5)
nRBC: 0 % (ref 0.0–0.2)

## 2022-07-17 LAB — PROTIME-INR
INR: 1.2 (ref 0.8–1.2)
Prothrombin Time: 15 seconds (ref 11.4–15.2)

## 2022-07-17 LAB — SARS CORONAVIRUS 2 BY RT PCR: SARS Coronavirus 2 by RT PCR: NEGATIVE

## 2022-07-17 LAB — HEMOGLOBIN A1C
Hgb A1c MFr Bld: 6.2 % — ABNORMAL HIGH (ref 4.8–5.6)
Mean Plasma Glucose: 131.24 mg/dL

## 2022-07-17 LAB — FERRITIN: Ferritin: 75 ng/mL (ref 11–307)

## 2022-07-17 LAB — VITAMIN B12: Vitamin B-12: 301 pg/mL (ref 180–914)

## 2022-07-17 LAB — PROCALCITONIN: Procalcitonin: <0.1 ng/mL

## 2022-07-17 NOTE — Progress Notes (Signed)
Progress Note   Patient: Kerri Carter:[id number] DOB: 1940/08/19 DOA: 07/16/2022     0 DOS: the patient was seen and examined on 07/17/2022   Brief hospital course:  Kerri Carter is a 82 y.o. female with medical history as listed below.  It is significant for patient having COPD/asthma overlap syndrome as well as nonspecific interstitial pneumonitis related to rheumatoid arthritis.  Patient is chronically on 3 L/min of supplementary oxygen and generally is able to walk on level ground without shortness of breath.  About 7 days ago, patient reduced her torsemide from her prescribed dose of 40 mg to 20 mg daily because she was going out of town and would not have had access to torsemide refills.  About 5 days ago patient started noticing increasing leg swelling as well as increasing shortness of breath while walking on level ground.  This progressed to the extent that today just turning in bed, sitting up would cause the patient to have tachypnea and shortness of breath.  Patient reports that she is always using several pillows to keep her head elevated during sleeping because she "smothers "if she lays flat to sleep.  Patient has a chronic cough that is dry.  There has been no change in that.  Patient has no chest pain no fever no expectoration.  No chest pain.  She is admitted to hospitalist service for evaluation of shortness of breath, due to fluid overload, heart failure exacerbation  Assessment and Plan: * Fluid overload Acute on chronic diastolic CHF Patient is only taking half of her recommended dose. She got 60 mg of IV Lasix last night.   Resume home dose 40 mg torsemide. Continue to monitor daily weights, strict input and output. Less likely pneumonia, procalcitonin normal. Physical therapy and Occupational Therapy evaluation for discharge plan.  Paroxysmal atrial fibrillation (HCC) Heart rate well-controlled.  Continue Bystolic. Continue Eliquis.  Rheumatoid arthritis  (HCC) Continue leflunomide but hold the Enbrel in the hospital.  Right rib fracture Continue pain control. Physical therapy evaluation for ambulation.  Nursing supportive care. PT OT evaluation. DVT prophylaxis Eliquis. CODE STATUS -DNR.     Subjective: Patient is seen and examined today morning.  She is lying comfortably.  Encouraged her to work with PT.  Has shortness of breath with ambulation.  Patient is currently on 3 L about oxygen which is her usual.  Physical Exam: Vitals:   07/17/22 1317 07/17/22 1427 07/17/22 1546 07/17/22 1547  BP: 105/79 120/62 113/62   Pulse: 74 72 77   Resp: 16 17  16   Temp: 98.5 F (36.9 C) 98.9 F (37.2 C)  98.6 F (37 C)  TempSrc: Oral     SpO2: 97% 97% 94%   Weight:      Height:       General - Elderly awake Caucasian obese female, no apparent distress HEENT - PERRLA, EOMI, atraumatic head, non tender sinuses. Lung -decreased breath, diffuse Rales Heart - S1, S2 heard, no murmurs, rubs, trace pedal edema Neuro - Alert, awake and oriented x 3, non focal exam. Skin - Warm and dry. Data Reviewed:  BMP, CBC  Family Communication: Patient admission and agrees with the discharge plan.  She agrees to go home with home health, aide  Disposition: Status is: Observation The patient remains OBS appropriate and will d/c before 2 midnights.  Planned Discharge Destination: Home with Home Health    Time spent: 42 minutes  Author: Marcelino Duster, MD 07/17/2022 4:59 PM  For on  call review [email or web address].

## 2022-07-17 NOTE — TOC Initial Note (Addendum)
Transition of Care [facility]) - Initial/Assessment Note    Patient Details  Name: Kerri Carter MRN: [id number] Date of Birth: May 16, 1940  Transition of [facility]) CM/SW Contact:    Allena Katz, LCSW Phone Number: 07/17/2022, 4:48 PM  Clinical Narrative:                  CSW spoke with patient regarding HH. Pt is interested and wanted to use [facility] for PT/OT/RN/AID. Pt reports she lives with her grandson who works during the day and she wants additional support. Pt reports someone told her she qualified for aide services. CSW told pt about helper bees through [facility] who is through [facility]. Pt understands that this service is not at all affiliated with [facility]. Pt agreeable to referral being made. Pt is on a chronic 3L of oxygen through [employer]. Pt is active with Dr. Clent Ridges for Primary Care.   4:56PM Pt reports she doesn't want [facility] now because they can't accomodates all female staff members. She would now like to see if [facility] can accomodate. Message sent to [facility].    Expected Discharge Plan: Home w Home Health Services Barriers to Discharge: Continued Medical Work up   Patient Goals and CMS Choice Patient states their goals for this hospitalization and ongoing recovery are:: return home CMS Medicare.gov Compare Post Acute Care list provided to:: Patient Choice offered to / list presented to : Patient      Expected Discharge Plan and Services                                   HH Arranged: PT, OT, Nurse's Aide HH Agency: [facility] Date [facility] Agency Contacted: 07/17/22 Time HH Agency Contacted: [phone] Representative spoke with at [facility] Agency: [place]  Prior Living Arrangements/Services       Do you feel safe going back to the place where you live?: Yes          Current home services: Home OT, Home PT, Home RN, Homehealth aide    Activities of Daily Living Home Assistive Devices/Equipment: Grab bars in shower ADL Screening (condition at time  of admission) Patient's cognitive ability adequate to safely complete daily activities?: Yes Is the patient deaf or have difficulty hearing?: No Does the patient have difficulty seeing, even when wearing glasses/contacts?: No Does the patient have difficulty concentrating, remembering, or making decisions?: No Patient able to express need for assistance with ADLs?: No Does the patient have difficulty dressing or bathing?: No Independently performs ADLs?: Yes (appropriate for developmental age) Does the patient have difficulty walking or climbing stairs?: Yes Weakness of Legs: None Weakness of Arms/Hands: Both  Permission Sought/Granted      Share Information with NAME: [facility]           Emotional Assessment       Orientation: : Oriented to Self, Oriented to Place, Oriented to  Time, Oriented to Situation      Admission diagnosis:  Fluid overload [E87.70] Patient Active Problem List   Diagnosis Date Noted   Fluid overload 07/16/2022   Right rib fracture 07/16/2022   Oral candida 05/17/2022   Hyperparathyroidism due to renal insufficiency (HCC) 04/23/2022   Obesity (BMI 30-39.9) 03/31/2022   NSVT (nonsustained ventricular tachycardia) (HCC) 03/30/2022   Acute urinary retention 03/30/2022   Sepsis due to pneumonia (HCC) 03/28/2022   Demand ischemia 03/28/2022   History of major orthopedic surgery 03/19/2022  Iron deficiency anemia due to chronic blood loss 10/09/2021   Chronic heart failure with preserved ejection fraction (HCC) 09/24/2021   Acute on chronic respiratory failure with hypoxia and hypercapnia (HCC) 09/10/2021   Acute on chronic diastolic CHF (congestive heart failure) (HCC) 08/20/2021   Paroxysmal atrial fibrillation (HCC) 07/29/2021   History of lumbar fusion 05/23/2021   Impaired mobility 12/18/2020   Urinary incontinence 12/18/2020   Lumbar radiculopathy 12/11/2020   Chronic pain of both shoulders 08/29/2019   Hypertriglyceridemia 07/24/2019    Bipolar disorder, in full remission, most recent episode depressed (HCC) 07/13/2019   Essential hypertension 06/16/2019   Chronic pain 06/16/2019   Lymphedema 06/05/2019   Chronic venous insufficiency 05/25/2019   PAD (peripheral artery disease) (HCC) 05/25/2019   Interstitial pulmonary disease (HCC) 07/07/2018   Leukocytosis 10/19/2017   Allergic rhinitis 09/28/2017   Age-related osteoporosis without current pathological fracture 11/05/2016   Peripheral neuropathy 10/28/2016   Osteoporosis 08/27/2016   Drug-induced osteoporosis 08/27/2016   Stage 3b chronic kidney disease (HCC) 08/25/2016   Adnexal mass 08/25/2016   Prediabetes 08/25/2016   Coronary atherosclerosis 08/25/2016   Hyperlipidemia 07/17/2015   Macular degeneration 07/17/2015   Rheumatoid arthritis (HCC) 12/05/2014   Osteoarthritis of knee 06/07/2013   Shortness of breath 10/13/2011   Valvular heart disease 10/13/2011   COPD with acute exacerbation (HCC) 09/09/2011   OSA (obstructive sleep apnea) 09/09/2011   Gastroesophageal reflux disease 02/24/2011   Acquired absence of kidney 02/24/2011   Renal artery stenosis (HCC) 02/24/2011   PCP:  Dana Allan, MD Pharmacy:   [phone] - Closed - [place], [place] - [address] [place] [address] Phone: [phone] Fax: [phone]  [id number] - [place],  - [address] [place] [address] Phone: [phone] Fax: [phone]  [facility] - [place], [place] - [address] [facility]. [address] [employer] [address] [place] [address] Phone: [phone] Fax: [phone]     Social Determinants of Health (SDOH) Social History: SDOH Screenings   Food Insecurity: No Food Insecurity (07/17/2022)  Housing: Low Risk  (07/17/2022)  Transportation Needs: No Transportation Needs (07/17/2022)  Utilities: Not At Risk (07/17/2022)  Depression (PHQ2-9): Low Risk  (05/13/2022)  Financial  Resource Strain: Low Risk  (06/18/2021)  Physical Activity: Inactive (11/24/2019)  Social Connections: Moderately Integrated (06/18/2021)  Stress: No Stress Concern Present (06/18/2021)  Tobacco Use: Medium Risk (07/17/2022)   SDOH Interventions:     Readmission Risk Interventions    08/21/2021    3:19 PM 05/24/2021    2:35 PM  Readmission Risk Prevention Plan  Transportation Screening Complete Complete  PCP or Specialist Appt within 3-5 Days  Complete  HRI or Home Care Consult  Complete  Social Work Consult for Recovery Care Planning/Counseling  Complete  Palliative Care Screening  Not Applicable  Medication Review Oceanographer) Complete Complete  HRI or Home Care Consult Complete   SW Recovery Care/Counseling Consult Complete   Palliative Care Screening Not Applicable   Skilled Nursing Facility Not Applicable

## 2022-07-17 NOTE — Evaluation (Signed)
Physical Therapy Evaluation Patient Details Name: Kerri Carter MRN: [id number] DOB: [birth year] Today's Date: 07/17/2022  History of Present Illness  82 y/o female presented to ED on 07/16/22 for SOB with exertion. Admitted for fluid overload. PMH: COPD on 3L, anxiety, aortic stenosis, asthma, CKD, CVA, depression, GERD/PUD, retention, interstitial lung disease, PAD, bipolar disorder, conversion disorder, heart murmur, HLD  Clinical Impression  Patient admitted with the above. PTA, patient lives with family and reports being modI with use of rollator for mobility and endorses 1 fall in past 6 months. Patient presents with weakness, impaired balance, and decreased activity tolerance. Patient on 3L O2 at baseline and during session with VSS. Able to complete bed mobility with supervision and sit to stand with min guard. Ambulated very short room distance with RW prior to reporting dizziness and increasing SOB. BP 131/66 in sitting with slow resolution of symptoms. Patient will benefit from skilled PT services during acute stay to address listed deficits. Patient will benefit from ongoing therapy at discharge to maximize functional independence and safety.        Recommendations for follow up therapy are one component of a multi-disciplinary discharge planning process, led by the attending physician.  Recommendations may be updated based on patient status, additional functional criteria and insurance authorization.  Follow Up Recommendations       Assistance Recommended at Discharge Intermittent Supervision/Assistance  Patient can return home with the following  A little help with walking and/or transfers;A little help with bathing/dressing/bathroom;Assistance with cooking/housework;Assist for transportation;Help with stairs or ramp for entrance    Equipment Recommendations None recommended by PT  Recommendations for Other Services       Functional Status Assessment Patient has had a recent  decline in their functional status and demonstrates the ability to make significant improvements in function in a reasonable and predictable amount of time.     Precautions / Restrictions Precautions Precautions: Fall Precaution Comments: 3L O2 Restrictions Weight Bearing Restrictions: No      Mobility  Bed Mobility Overal bed mobility: Needs Assistance Bed Mobility: Supine to Sit, Sit to Supine     Supine to sit: Supervision Sit to supine: Supervision        Transfers Overall transfer level: Needs assistance Equipment used: Rolling Luay Balding (2 wheels) Transfers: Sit to/from Stand Sit to Stand: Min guard           General transfer comment: min guard for safety.    Ambulation/Gait Ambulation/Gait assistance: Min guard Gait Distance (Feet): 15 Feet Assistive device: Rolling Ariel Dimitri (2 wheels) Gait Pattern/deviations: Step-through pattern, Decreased stride length, Trunk flexed Gait velocity: decreased     General Gait Details: min guard for safety. Reporting dizziness and increasing SOB requiring return to bed. BP stable 131/66 in sitting  Stairs            Wheelchair Mobility    Modified Rankin (Stroke Patients Only)       Balance Overall balance assessment: Needs assistance Sitting-balance support: No upper extremity supported, Feet supported Sitting balance-Leahy Scale: Good     Standing balance support: Bilateral upper extremity supported, Reliant on assistive device for balance Standing balance-Leahy Scale: Fair                               Pertinent Vitals/Pain Pain Assessment Pain Assessment: No/denies pain    Home Living Family/patient expects to be discharged to:: Private residence Living Arrangements: Other relatives (2 children and 2  grandchildren) Available Help at Discharge: Family;Available PRN/intermittently Type of Home: House Home Access: Ramped entrance       Home Layout: One level Home Equipment: Rollator  (4 wheels);BSC/3in1;Shower seat;Toilet riser;Grab bars - toilet;Hand held shower head;Grab bars - tub/shower;Adaptive equipment;Transport chair;Other (comment);Wheelchair - power      Prior Function Prior Level of Function : Independent/Modified Independent             Mobility Comments: Mod Ind amb household distances with a rollator or occasional furniture cruising, x 1 falls in the last 6 months       Hand Dominance        Extremity/Trunk Assessment   Upper Extremity Assessment Upper Extremity Assessment: Defer to OT evaluation    Lower Extremity Assessment Lower Extremity Assessment: Generalized weakness    Cervical / Trunk Assessment Cervical / Trunk Assessment: Kyphotic  Communication   Communication: No difficulties  Cognition Arousal/Alertness: Awake/alert Behavior During Therapy: WFL for tasks assessed/performed Overall Cognitive Status: Within Functional Limits for tasks assessed                                          General Comments      Exercises     Assessment/Plan    PT Assessment Patient needs continued PT services  PT Problem List Decreased strength;Decreased activity tolerance;Decreased mobility;Decreased balance;Decreased safety awareness;Decreased knowledge of precautions;Cardiopulmonary status limiting activity       PT Treatment Interventions Gait training;DME instruction;Functional mobility training;Therapeutic activities;Therapeutic exercise;Balance training;Patient/family education    PT Goals (Current goals can be found in the Care Plan section)  Acute Rehab PT Goals Patient Stated Goal: to go home PT Goal Formulation: With patient Time For Goal Achievement: 07/31/22 Potential to Achieve Goals: Good    Frequency Min 3X/week     Co-evaluation               AM-PAC PT "6 Clicks" Mobility  Outcome Measure Help needed turning from your back to your side while in a flat bed without using bedrails?: A  Little Help needed moving from lying on your back to sitting on the side of a flat bed without using bedrails?: A Little Help needed moving to and from a bed to a chair (including a wheelchair)?: A Little Help needed standing up from a chair using your arms (e.g., wheelchair or bedside chair)?: A Little Help needed to walk in hospital room?: A Little Help needed climbing 3-5 steps with a railing? : A Little 6 Click Score: 18    End of Session Equipment Utilized During Treatment: Oxygen Activity Tolerance: Patient tolerated treatment well Patient left: in bed;with call bell/phone within reach Nurse Communication: Mobility status PT Visit Diagnosis: Unsteadiness on feet (R26.81);Muscle weakness (generalized) (M62.81)    Time: 1106-1130 PT Time Calculation (min) (ACUTE ONLY): 24 min   Charges:   PT Evaluation $PT Eval Moderate Complexity: 1 Mod PT Treatments $Therapeutic Activity: 8-22 mins        Maylon Peppers, PT, DPT Physical Therapist - [facility]  [facility]   Santiago Stenzel A Mariluz Crespo 07/17/2022, 1:14 PM

## 2022-07-17 NOTE — Evaluation (Signed)
Occupational Therapy Evaluation Patient Details Name: Kerri Carter MRN: [id number] DOB: 03-Mar-1940 Today's Date: 07/17/2022   History of Present Illness 82 y/o female presented to ED on 07/16/22 for SOB with exertion. Admitted for fluid overload. PMH: COPD on 3L, anxiety, aortic stenosis, asthma, CKD, CVA, depression, GERD/PUD, retention, interstitial lung disease, PAD, bipolar disorder, conversion disorder, heart murmur, HLD   Clinical Impression    Pt was seen for OT evaluation this date. Prior to hospital admission, pt was MOD I - MIN A at home for ADL completion. Pt lives with family in an accessible home. Pt reports that family is available before and after work to assist as needed. Pt presents to acute OT demonstrating impaired ADL performance and functional mobility 2/2 generalized weakness and activity tolerance (See OT problem list for additional functional deficits). Pt is chronic 3L [place]. Pt reports history of RA and OA in BUE. Pt was educated on HEP and strategies for energy conservation. Pt currently requires supervision for sit<>stand. Pt demonstrate MOD I (increase time) for bed mobility. MIN A for lower body dressing 2/2 to ROM. Do not anticipate the need for follow up OT services upon acute hospital DC. Pt reported no concerns related to skilled acute care OT. OT will sign off.       Recommendations for follow up therapy are one component of a multi-disciplinary discharge planning process, led by the attending physician.  Recommendations may be updated based on patient status, additional functional criteria and insurance authorization.   Assistance Recommended at Discharge Intermittent Supervision/Assistance  Patient can return home with the following A little help with walking and/or transfers;A little help with bathing/dressing/bathroom;Assistance with cooking/housework;Assist for transportation;Help with stairs or ramp for entrance    Functional Status Assessment  Patient has  had a recent decline in their functional status and demonstrates the ability to make significant improvements in function in a reasonable and predictable amount of time.  Equipment Recommendations  None recommended by OT    Recommendations for Other Services       Precautions / Restrictions Precautions Precautions: Fall Precaution Comments: 3L O2 Restrictions Weight Bearing Restrictions: No      Mobility Bed Mobility Overal bed mobility: Modified Independent             General bed mobility comments: Pt demonstrated indpendence in getting in and out of bed. Pt manipulated HOB to facilitate transfer. Pt has ajustable bed at home.    Transfers Overall transfer level: Needs assistance Equipment used: Rolling walker (2 wheels) Transfers: Sit to/from Stand Sit to Stand: Min guard           General transfer comment: min guard for safety.      Balance Overall balance assessment: Needs assistance Sitting-balance support: No upper extremity supported, Feet supported Sitting balance-Leahy Scale: Good     Standing balance support: No upper extremity supported Standing balance-Leahy Scale: Good Standing balance comment: Good dynamic standing balance while simulated upper body dressing task.                           ADL either performed or assessed with clinical judgement   ADL Overall ADL's : Needs assistance/impaired;At baseline                 Upper Body Dressing : Supervision/safety;Sitting   Lower Body Dressing: Minimal assistance;Sitting/lateral leans Lower Body Dressing Details (indicate cue type and reason): simulated - demonstrated appropriate ROM to touch ankles. Anticipate MIN  to thread clothes around feet.             Functional mobility during ADLs: Min guard;Rolling walker (2 wheels)       Vision         Perception     Praxis      Pertinent Vitals/Pain Pain Assessment Pain Assessment: No/denies pain     Hand  Dominance Right   Extremity/Trunk Assessment Upper Extremity Assessment Upper Extremity Assessment: Overall WFL for tasks assessed   Lower Extremity Assessment Lower Extremity Assessment: Overall WFL for tasks assessed   Cervical / Trunk Assessment Cervical / Trunk Assessment: Kyphotic   Communication Communication Communication: No difficulties   Cognition Arousal/Alertness: Awake/alert Behavior During Therapy: WFL for tasks assessed/performed Overall Cognitive Status: Within Functional Limits for tasks assessed                                       General Comments       Exercises     Shoulder Instructions      Home Living Family/patient expects to be discharged to:: Private residence Living Arrangements: Children Available Help at Discharge: Family;Available PRN/intermittently (Both adults work, home alone during the day.) Type of Home: House Home Access: Ramped entrance     Home Layout: One level     Bathroom Shower/Tub: Producer, television/film/video: Handicapped height     Home Equipment: Rollator (4 wheels);BSC/3in1;Shower seat;Toilet riser;Grab bars - toilet;Hand held shower head;Grab bars - tub/shower;Adaptive equipment;Transport chair;Other (comment);Wheelchair - power [employer] Equipment: Reacher Additional Comments: Pt has a twin size bed that elevates at [facility] and FOB.      Prior Functioning/Environment Prior Level of Function : Independent/Modified Independent             Mobility Comments: Mod Ind amb household distances with a rollator. ADLs Comments: Pt reports that family assist lower body dressing PRN and showering. Family assists with IADLs (driving, meal prep).        OT Problem List: Decreased activity tolerance;Decreased strength      OT Treatment/Interventions:      OT Goals(Current goals can be found in the care plan section) Acute Rehab OT Goals Patient Stated Goal: To increase ue strength OT Goal  Formulation: With patient Time For Goal Achievement: 07/31/22 Potential to Achieve Goals: Fair  OT Frequency:      Co-evaluation              AM-PAC OT "6 Clicks" Daily Activity     Outcome Measure Help from another person eating meals?: None Help from another person taking care of personal grooming?: A Little Help from another person toileting, which includes using toliet, bedpan, or urinal?: A Little Help from another person bathing (including washing, rinsing, drying)?: A Little Help from another person to put on and taking off regular upper body clothing?: None Help from another person to put on and taking off regular lower body clothing?: A Little 6 Click Score: 20   End of Session Equipment Utilized During Treatment: Rolling walker (2 wheels)  Activity Tolerance: Patient tolerated treatment well Patient left: in bed;with call bell/phone within reach;with bed alarm set  OT Visit Diagnosis: Muscle weakness (generalized) (M62.81);History of falling (Z91.81)                Time: 4098-1191 OT Time Calculation (min): 19 min Charges:  OT General Charges $OT Visit: 1 Visit OT Evaluation $  OT Eval Low Complexity: 1 Low [address], OTS

## 2022-07-18 DIAGNOSIS — M800AXA Age-related osteoporosis with current pathological fracture, other site, initial encounter for fracture: Secondary | ICD-10-CM | POA: Diagnosis present

## 2022-07-18 DIAGNOSIS — Z905 Acquired absence of kidney: Secondary | ICD-10-CM | POA: Diagnosis not present

## 2022-07-18 DIAGNOSIS — Z96643 Presence of artificial hip joint, bilateral: Secondary | ICD-10-CM | POA: Diagnosis present

## 2022-07-18 DIAGNOSIS — N1831 Chronic kidney disease, stage 3a: Secondary | ICD-10-CM | POA: Diagnosis present

## 2022-07-18 DIAGNOSIS — I739 Peripheral vascular disease, unspecified: Secondary | ICD-10-CM | POA: Diagnosis present

## 2022-07-18 DIAGNOSIS — I509 Heart failure, unspecified: Secondary | ICD-10-CM

## 2022-07-18 DIAGNOSIS — Z8249 Family history of ischemic heart disease and other diseases of the circulatory system: Secondary | ICD-10-CM | POA: Diagnosis not present

## 2022-07-18 DIAGNOSIS — R0602 Shortness of breath: Secondary | ICD-10-CM | POA: Diagnosis present

## 2022-07-18 DIAGNOSIS — M069 Rheumatoid arthritis, unspecified: Secondary | ICD-10-CM | POA: Diagnosis present

## 2022-07-18 DIAGNOSIS — Z96651 Presence of right artificial knee joint: Secondary | ICD-10-CM | POA: Diagnosis present

## 2022-07-18 DIAGNOSIS — I130 Hypertensive heart and chronic kidney disease with heart failure and stage 1 through stage 4 chronic kidney disease, or unspecified chronic kidney disease: Secondary | ICD-10-CM | POA: Diagnosis present

## 2022-07-18 DIAGNOSIS — Z87891 Personal history of nicotine dependence: Secondary | ICD-10-CM | POA: Diagnosis not present

## 2022-07-18 DIAGNOSIS — Z7901 Long term (current) use of anticoagulants: Secondary | ICD-10-CM | POA: Diagnosis not present

## 2022-07-18 DIAGNOSIS — Z833 Family history of diabetes mellitus: Secondary | ICD-10-CM | POA: Diagnosis not present

## 2022-07-18 DIAGNOSIS — J9611 Chronic respiratory failure with hypoxia: Secondary | ICD-10-CM | POA: Diagnosis present

## 2022-07-18 DIAGNOSIS — J449 Chronic obstructive pulmonary disease, unspecified: Secondary | ICD-10-CM | POA: Diagnosis present

## 2022-07-18 DIAGNOSIS — I5033 Acute on chronic diastolic (congestive) heart failure: Secondary | ICD-10-CM | POA: Diagnosis present

## 2022-07-18 DIAGNOSIS — I480 Paroxysmal atrial fibrillation: Secondary | ICD-10-CM | POA: Diagnosis present

## 2022-07-18 DIAGNOSIS — E785 Hyperlipidemia, unspecified: Secondary | ICD-10-CM | POA: Diagnosis present

## 2022-07-18 DIAGNOSIS — Z96612 Presence of left artificial shoulder joint: Secondary | ICD-10-CM | POA: Diagnosis present

## 2022-07-18 DIAGNOSIS — Z823 Family history of stroke: Secondary | ICD-10-CM | POA: Diagnosis not present

## 2022-07-18 DIAGNOSIS — Z1152 Encounter for screening for COVID-19: Secondary | ICD-10-CM | POA: Diagnosis not present

## 2022-07-18 DIAGNOSIS — I060 Rheumatic aortic stenosis: Secondary | ICD-10-CM | POA: Diagnosis present

## 2022-07-18 DIAGNOSIS — Z66 Do not resuscitate: Secondary | ICD-10-CM | POA: Diagnosis present

## 2022-07-18 DIAGNOSIS — E8779 Other fluid overload: Secondary | ICD-10-CM | POA: Diagnosis not present

## 2022-07-18 DIAGNOSIS — D631 Anemia in chronic kidney disease: Secondary | ICD-10-CM | POA: Diagnosis present

## 2022-07-18 DIAGNOSIS — I2510 Atherosclerotic heart disease of native coronary artery without angina pectoris: Secondary | ICD-10-CM | POA: Diagnosis present

## 2022-07-18 MED ORDER — FUROSEMIDE 10 MG/ML IJ SOLN
20.0000 mg | Freq: Once | INTRAMUSCULAR | Status: AC
  Administered 2022-07-18: 20 mg via INTRAVENOUS
  Filled 2022-07-18: qty 2

## 2022-07-18 MED ORDER — PROSIGHT PO TABS
1.0000 | ORAL_TABLET | Freq: Every day | ORAL | Status: DC
  Administered 2022-07-18 – 2022-07-19 (×2): 1 via ORAL
  Filled 2022-07-18 (×2): qty 1

## 2022-07-18 MED ORDER — IPRATROPIUM-ALBUTEROL 0.5-2.5 (3) MG/3ML IN SOLN: 3.0000 mL | Freq: Four times a day (QID) | RESPIRATORY_TRACT | Status: DC | PRN

## 2022-07-18 MED ORDER — FLUTICASONE PROPIONATE 50 MCG/ACT NA SUSP
1.0000 | Freq: Every day | NASAL | Status: DC
  Administered 2022-07-18 – 2022-07-19 (×2): 1 via NASAL
  Filled 2022-07-18: qty 16

## 2022-07-18 MED ORDER — PANTOPRAZOLE SODIUM 40 MG PO TBEC
40.0000 mg | DELAYED_RELEASE_TABLET | Freq: Two times a day (BID) | ORAL | Status: DC
  Administered 2022-07-18 – 2022-07-19 (×2): 40 mg via ORAL
  Filled 2022-07-18 (×3): qty 1

## 2022-07-18 MED ORDER — OCUVITE-LUTEIN PO CAPS
1.0000 | ORAL_CAPSULE | Freq: Every day | ORAL | Status: DC
  Filled 2022-07-18 (×2): qty 1

## 2022-07-18 MED ORDER — DICYCLOMINE HCL 10 MG PO CAPS
10.0000 mg | ORAL_CAPSULE | Freq: Two times a day (BID) | ORAL | Status: DC | PRN
  Administered 2022-07-18: 10 mg via ORAL
  Filled 2022-07-18 (×2): qty 1

## 2022-07-18 MED ORDER — BUDESONIDE 0.5 MG/2ML IN SUSP
0.5000 mg | Freq: Two times a day (BID) | RESPIRATORY_TRACT | Status: DC
  Filled 2022-07-18: qty 2

## 2022-07-18 NOTE — Progress Notes (Signed)
Progress Note   Patient: Kerri Carter ZOX:[id number] DOB: [birth year] DOA: 07/16/2022     0 DOS: the patient was seen and examined on 07/18/2022   Brief hospital course:  BRECKLYN MAUZY is a 82 y.o. female with medical history as listed below.  It is significant for patient having COPD/asthma overlap syndrome as well as nonspecific interstitial pneumonitis related to rheumatoid arthritis.  Patient is chronically on 3 L/min of supplementary oxygen and generally is able to walk on level ground without shortness of breath.  About 7 days ago, patient reduced her torsemide from her prescribed dose of 40 mg to 20 mg daily because she was going out of town and would not have had access to torsemide refills.  About 5 days ago patient started noticing increasing leg swelling as well as increasing shortness of breath while walking on level ground.  This progressed to the extent that today just turning in bed, sitting up would cause the patient to have tachypnea and shortness of breath.  Patient reports that she is always using several pillows to keep her head elevated during sleeping because she "smothers "if she lays flat to sleep.  Patient has a chronic cough that is dry.  There has been no change in that.  Patient has no chest pain no fever no expectoration.  No chest pain.  She is admitted to hospitalist service for evaluation of shortness of breath, due to fluid overload, heart failure exacerbation  Assessment and Plan: * Fluid overload Acute on chronic diastolic CHF Patient is only taking half of her recommended dose. She got 60 mg of IV Lasix last night.   Continue home dose 40 mg torsemide. Today unable to walk without dyspnea, extra dose IV lasix 20mg  ordered. Continue to monitor daily weights, strict input and output. Less likely pneumonia, procalcitonin normal. Physical therapy and Occupational Therapy evaluation for discharge plan.  Paroxysmal atrial fibrillation (HCC) Heart rate well-controlled.   Continue Bystolic. Continue Eliquis.  Rheumatoid arthritis (HCC) Continue leflunomide but hold the Enbrel in the hospital.  Right rib fracture Continue pain control. Physical therapy evaluation for ambulation.  Nursing supportive care. PT OT evaluation. DVT prophylaxis Eliquis. CODE STATUS -DNR.     Subjective: Patient is seen and examined today morning.  She is lying comfortably.  Encouraged her to walk with RN. RN noted that she become short of breath while ambulating, had to sit x3. Feels weak. Patient is currently on 3 L home o2.  Physical Exam: Vitals:   07/18/22 0500 07/18/22 0851 07/18/22 1201 07/18/22 1629  BP:  (!) 118/57 (!) 131/56 129/60  Pulse:  65 66 70  Resp:  18 20 20   Temp:  98 F (36.7 C) 98.7 F (37.1 C) 98 F (36.7 C)  TempSrc:  Oral Oral Oral  SpO2:  91% 95% 94%  Weight: 71.2 kg     Height:       General - Elderly awake Caucasian obese female, mild respiratory distress HEENT - PERRLA, EOMI, atraumatic head, non tender sinuses. Lung -decreased breath, diffuse Rales Heart - S1, S2 heard, no murmurs, rubs, trace pedal edema Neuro - Alert, awake and oriented x 3, non focal exam. Skin - Warm and dry. Data Reviewed:  There are no new results to review at this time.  Family Communication: Patient admission and agrees with the discharge plan.  She agrees to go home with home health, aide  Disposition: Status is: Observation The patient will require care spanning > 2 midnights and  should be moved to inpatient because: short of breath, need assistance, feels weak  Planned Discharge Destination: Home with Home Health    Time spent: 42 minutes  Author: Marcelino Duster, MD 07/18/2022 5:18 PM  For on call review [email or web address].

## 2022-07-18 NOTE — Progress Notes (Signed)
Physical Therapy Treatment Patient Details Name: Kerri Carter MRN: [id number] DOB: 1940-09-15 Today's Date: 07/18/2022   History of Present Illness 82 y/o female presented to ED on 07/16/22 for SOB with exertion. Admitted for fluid overload. PMH: COPD on 3L, anxiety, aortic stenosis, asthma, CKD, CVA, depression, GERD/PUD, retention, interstitial lung disease, PAD, bipolar disorder, conversion disorder, heart murmur, HLD    PT Comments    Skilled session focused on seated LE strengthening exercises and explaining d/c recommendations. Pt was just finishing up ambulating around nursing station with NT using RW at min guard level; pt reported needing 2 seated rest breaks during walk. Pt on 3LO2 and SPO2 after ambulation was 94%.  Pt continues to required supervision to min guard A with mobility to steadying assist.  Patient will benefit from skilled PT services during acute stay to address listed deficits. Patient will benefit from ongoing therapy at discharge to maximize functional independence and safety.      Recommendations for follow up therapy are one component of a multi-disciplinary discharge planning process, led by the attending physician.  Recommendations may be updated based on patient status, additional functional criteria and insurance authorization.  Follow Up Recommendations       Assistance Recommended at Discharge Intermittent Supervision/Assistance  Patient can return home with the following A little help with walking and/or transfers;A little help with bathing/dressing/bathroom;Assistance with cooking/housework;Assist for transportation;Help with stairs or ramp for entrance   Equipment Recommendations  None recommended by PT    Recommendations for Other Services       Precautions / Restrictions Precautions Precautions: Fall Precaution Comments: 3L O2 Restrictions Weight Bearing Restrictions: No     Mobility  Bed Mobility               General bed mobility  comments: pt up in recliner.    Transfers Overall transfer level: Needs assistance Equipment used: Rolling walker (2 wheels) Transfers: Sit to/from Stand, Bed to chair/wheelchair/BSC Sit to Stand: Supervision   Step pivot transfers: Min guard       General transfer comment: min guard for safety.    Ambulation/Gait Ambulation/Gait assistance: Min guard Gait Distance (Feet): 60 Feet (observed pt ambulating around nursing station with Nurse tech. pt required seated rest breaks x2) Assistive device: Rolling walker (2 wheels) Gait Pattern/deviations: Step-through pattern, Decreased stride length, Trunk flexed Gait velocity: decreased     General Gait Details: Pt reported fatigue in UEs from increased weightbearing.   Stairs             Wheelchair Mobility    Modified Rankin (Stroke Patients Only)       Balance Overall balance assessment: Needs assistance Sitting-balance support: No upper extremity supported, Feet supported Sitting balance-Leahy Scale: Good     Standing balance support: No upper extremity supported Standing balance-Leahy Scale: Good Standing balance comment: no UE support with functional activities within BOS.                            Cognition Arousal/Alertness: Awake/alert Behavior During Therapy: WFL for tasks assessed/performed Overall Cognitive Status: Within Functional Limits for tasks assessed                                          Exercises Total Joint Exercises Ankle Circles/Pumps: AROM, Strengthening, 10 reps Towel Squeeze: AROM, Both, 20 reps Long Arc  Quad: AROM, Strengthening, Both, 20 reps Marching in Standing: Seated, AROM, Strengthening, Both, 10 reps    General Comments        Pertinent Vitals/Pain Pain Assessment Pain Assessment: Faces Faces Pain Scale: Hurts little more Pain Location: bilateral shoulders Pain Descriptors / Indicators: Aching Pain Intervention(s): Limited activity  within patient's tolerance    Home Living                          Prior Function            PT Goals (current goals can now be found in the care plan section) Acute Rehab PT Goals Patient Stated Goal: to go home PT Goal Formulation: With patient Time For Goal Achievement: 07/31/22 Potential to Achieve Goals: Good Progress towards PT goals: Progressing toward goals    Frequency    Min 3X/week      PT Plan      Co-evaluation              AM-PAC PT "6 Clicks" Mobility   Outcome Measure  Help needed turning from your back to your side while in a flat bed without using bedrails?: A Little Help needed moving from lying on your back to sitting on the side of a flat bed without using bedrails?: A Little Help needed moving to and from a bed to a chair (including a wheelchair)?: A Little Help needed standing up from a chair using your arms (e.g., wheelchair or bedside chair)?: A Little Help needed to walk in hospital room?: A Little Help needed climbing 3-5 steps with a railing? : A Little 6 Click Score: 18    End of Session Equipment Utilized During Treatment: Oxygen;Gait belt Activity Tolerance: Patient tolerated treatment well Patient left: with call bell/phone within reach;in chair Nurse Communication: Mobility status PT Visit Diagnosis: Unsteadiness on feet (R26.81);Muscle weakness (generalized) (M62.81)     Time: 1610-9604 PT Time Calculation (min) (ACUTE ONLY): 18 min  Charges:  $Therapeutic Exercise: 8-22 mins                     Hortencia Conradi, PTA  07/18/22, 3:18 PM

## 2022-07-18 NOTE — Care Management Obs Status (Signed)
MEDICARE OBSERVATION STATUS NOTIFICATION   Patient Details  Name: Kerri Carter MRN: [id number] Date of Birth: 1941/01/11   Medicare Observation Status Notification Given:  Yes    Loman Logan, LCSW 07/18/2022, 12:26 PM

## 2022-07-19 DIAGNOSIS — E8779 Other fluid overload: Secondary | ICD-10-CM | POA: Diagnosis not present

## 2022-07-19 MED ORDER — ALUM & MAG HYDROXIDE-SIMETH 200-200-20 MG/5ML PO SUSP
30.0000 mL | ORAL | Status: DC | PRN
  Administered 2022-07-19: 30 mL via ORAL
  Filled 2022-07-19: qty 30

## 2022-07-19 MED ORDER — DICYCLOMINE HCL 10 MG PO CAPS: 10.0000 mg | ORAL_CAPSULE | Freq: Three times a day (TID) | ORAL | 0 refills | Status: DC

## 2022-07-19 MED ORDER — TORSEMIDE 20 MG PO TABS: 40.0000 mg | ORAL_TABLET | Freq: Every day | ORAL | 0 refills | Status: AC

## 2022-07-19 MED ORDER — DICYCLOMINE HCL 10 MG PO CAPS
10.0000 mg | ORAL_CAPSULE | Freq: Three times a day (TID) | ORAL | Status: DC
  Filled 2022-07-19 (×2): qty 1

## 2022-07-19 NOTE — TOC Transition Note (Addendum)
Transition of Care [facility]) - CM/SW Discharge Note   Patient Details  Name: TAKIYAH DRZEWIECKI MRN: [id number] Date of Birth: 04-Aug-1940  Transition of Care [facility] CM/SW Contact:  Bing Quarry, RN Phone Number: 07/19/2022, 3:12 PM   Clinical Narrative: 6/16: Patient requested all female therapists for [facility] on discharge. [name] was contacted with message in 6/14 with no return call to date. Left VM with on call person. Patient is discharging today and RN CM discussed options. Patient states that [facility] accommodated for that in the past and she did not wish to go back to [facility]. She was agreeable to contacting [facility] herself or through PCP to set up [facility] with HH orders in place in patient EMR record.  Patient said she felt safe going home and doing that. Updated provider. Gabriel Cirri RN CM    Update: [facility] called back and they can accommodate patient request and will reach out to her tomorrow. Updated provider. Gabriel Cirri RN CM   Final next level of care: Home w Home Health Services Barriers to Discharge: Continued Medical Work up   Patient Goals and CMS Choice CMS Medicare.gov Compare Post Acute Care list provided to:: Patient Choice offered to / list presented to : Patient  Discharge Placement                         Discharge Plan and Services Additional resources added to the After Visit Summary for                            [facility] Arranged: PT, OT, Nurse's Aide HH Agency: [facility] Date [facility] Agency Contacted: 07/17/22 Time HH Agency Contacted: [phone] Representative spoke with at [facility] Agency: [place]  Social Determinants of Health (SDOH) Interventions SDOH Screenings   Food Insecurity: No Food Insecurity (07/17/2022)  Housing: Low Risk  (07/17/2022)  Transportation Needs: No Transportation Needs (07/17/2022)  Utilities: Not At Risk (07/17/2022)  Depression (PHQ2-9): Low Risk  (05/13/2022)  Financial Resource Strain: Low Risk  (06/18/2021)  Physical Activity:  Inactive (11/24/2019)  Social Connections: Moderately Integrated (06/18/2021)  Stress: No Stress Concern Present (06/18/2021)  Tobacco Use: Medium Risk (07/17/2022)     Readmission Risk Interventions    08/21/2021    3:19 PM 05/24/2021    2:35 PM  Readmission Risk Prevention Plan  Transportation Screening Complete Complete  PCP or Specialist Appt within 3-5 Days  Complete  HRI or Home Care Consult  Complete  Social Work Consult for Recovery Care Planning/Counseling  Complete  Palliative Care Screening  Not Applicable  Medication Review Oceanographer) Complete Complete  HRI or Home Care Consult Complete   SW Recovery Care/Counseling Consult Complete   Palliative Care Screening Not Applicable   Skilled Nursing Facility Not Applicable

## 2022-07-19 NOTE — Plan of Care (Signed)

## 2022-07-19 NOTE — Discharge Summary (Signed)
Physician Discharge Summary  Kerri Carter UEA:[id number] DOB: [birth year] DOA: 07/16/2022  PCP: Dana Allan, MD  Admit date: 07/16/2022 Discharge date: 07/19/2022  Admitted From: Home Disposition:  Home w home health  Recommendations for Outpatient Follow-up:  Follow up with PCP in 1-2 weeks   Home Health:Yes PT  Equipment/Devices:Oxygen 3L via [place]   Discharge Condition:Stable  CODE STATUS:DNR  Diet recommendation: Reg  Brief/Interim Summary:. Kerri Carter is a 82 y.o. female with medical history as listed below.  It is significant for patient having COPD/asthma overlap syndrome as well as nonspecific interstitial pneumonitis related to rheumatoid arthritis.  Patient is chronically on 3 L/min of supplementary oxygen and generally is able to walk on level ground without shortness of breath.  About 7 days ago, patient reduced her torsemide from her prescribed dose of 40 mg to 20 mg daily because she was going out of town and would not have had access to torsemide refills.  About 5 days ago patient started noticing increasing leg swelling as well as increasing shortness of breath while walking on level ground.  This progressed to the extent that today just turning in bed, sitting up would cause the patient to have tachypnea and shortness of breath.  Patient reports that she is always using several pillows to keep her head elevated during sleeping because she "smothers "if she lays flat to sleep.  Patient has a chronic cough that is dry.  There has been no change in that.  Patient has no chest pain no fever no expectoration.  No chest pain.  She is admitted to hospitalist service for evaluation of shortness of breath, due to fluid overload, heart failure exacerbation     Discharge Diagnoses:  Principal Problem:   Fluid overload Active Problems:   Paroxysmal atrial fibrillation (HCC)   Rheumatoid arthritis (HCC)   Shortness of breath   Chronic kidney disease, stage 3a (HCC)   Right rib  fracture   Acute CHF (congestive heart failure) (HCC)  * Fluid overload Acute on chronic diastolic CHF Patient is only taking half of her recommended dose. She got 60 mg of IV Lasix last night.   Continue home dose 40 mg torsemide. Ambulating without SOB Discharge home on torsemide 40 every day FU OP with HF clinic and cardiology   Paroxysmal atrial fibrillation (HCC) Heart rate well-controlled.  Continue Bystolic. Continue Eliquis.   Rheumatoid arthritis (HCC) Continue leflunomide, resume Enbrel   Right rib fracture Continue pain control. Physical therapy evaluation for ambulation HHPT ordered on DC  Discharge Instructions  Discharge Instructions     Diet - low sodium heart healthy   Complete by: As directed    Increase activity slowly   Complete by: As directed       Allergies as of 07/19/2022       Reactions   Ceftin [cefuroxime Axetil] Anaphylaxis   Lisinopril Anaphylaxis   Sulfa Antibiotics Other (See Comments)   Face swelling   Sulfasalazine Anaphylaxis   Morphine Other (See Comments)   Per patient, low blood pressure issues that requires action to raise it back up. Can take small infrequent doses   Xarelto [rivaroxaban] Other (See Comments)   Stomach burning, bleeding, and tar in stool   Adhesive [tape] Rash   Paper tape and tega derm OK   Antihistamines, Chlorpheniramine-type Other (See Comments)   Makes pt hyper   Antivert [meclizine Hcl] Other (See Comments)   Bladder will not empty   Aspirin Other (See Comments)  Sulfasalazine allergy cross reacts   Contrast Media [iodinated Contrast Media] Rash   she is able to use betadine scrubs.   Decongestant [pseudoephedrine Hcl] Other (See Comments)   Makes pt hyper   Doxycycline Other (See Comments)   GI upset   Levaquin [levofloxacin In D5w] Rash   Polymyxin B Other (See Comments)   Facial rash   Tetanus Toxoids Rash, Other (See Comments)   Fever and hot to touch at injection site         Medication List     STOP taking these medications    Vitamin D3 50 MCG (2000 UT) Tabs       TAKE these medications    albuterol 108 (90 Base) MCG/ACT inhaler Commonly known as: Ventolin HFA Inhale 2 puffs into the lungs every 6 (six) hours as needed for wheezing or shortness of breath.   albuterol (2.5 MG/3ML) 0.083% nebulizer solution Commonly known as: PROVENTIL Take by nebulization.   amLODipine 5 MG tablet Commonly known as: NORVASC Take 1 tablet (5 mg total) by mouth daily.   apixaban 5 MG Tabs tablet Commonly known as: ELIQUIS Take 1 tablet (5 mg total) by mouth 2 (two) times daily.   Breztri Aerosphere 160-9-4.8 MCG/ACT Aero Generic drug: Budeson-Glycopyrrol-Formoterol Inhale 2 puffs into the lungs in the morning and at bedtime.   budesonide 0.5 MG/2ML nebulizer solution Commonly known as: PULMICORT Take 2 mLs (0.5 mg total) by nebulization 2 (two) times daily.   busPIRone 5 MG tablet Commonly known as: BUSPAR Take 1 tablet (5 mg total) by mouth 2 (two) times daily.   cetirizine 10 MG tablet Commonly known as: ZYRTEC TAKE 1 TABLET BY MOUTH EVERYDAY AT BEDTIME   dicyclomine 10 MG capsule Commonly known as: BENTYL Take 1 capsule (10 mg total) by mouth 3 (three) times daily before meals. What changed:  when to take this reasons to take this   Enbrel SureClick 50 MG/ML injection Generic drug: etanercept Inject 50 mg into the skin once a week. Takes on Fridays   escitalopram 10 MG tablet Commonly known as: LEXAPRO Take 1 tablet (10 mg total) by mouth daily.   ferrous sulfate 325 (65 FE) MG EC tablet Take 1 tablet (325 mg total) by mouth 2 (two) times daily. What changed: when to take this   gabapentin 300 MG capsule Commonly known as: NEURONTIN Take 300 mg in the morning and 600 mg at night.   Gemtesa 75 MG Tabs Generic drug: Vibegron Take 75 mg by mouth daily.   ipratropium-albuterol 0.5-2.5 (3) MG/3ML Soln Commonly known as:  DUONEB SMARTSIG:3 Milliliter(s) Via Nebulizer Every 6 Hours PRN   lamoTRIgine 100 MG tablet Commonly known as: LAMICTAL TAKE 1 TAB 2 TIMES DAILY. FURTHER REFILLS NEW PSYCH FOR ALL PSYCH MEDS ONLY TEMP SUPPLY FROM PCP   leflunomide 20 MG tablet Commonly known as: ARAVA Take 1 tablet (20 mg total) by mouth daily.   lidocaine 5 % Commonly known as: Lidoderm Place 1 patch onto the skin 2 (two) times daily as needed. Remove & Discard patch within 12 hours or as directed by MD   lovastatin 20 MG tablet Commonly known as: MEVACOR TAKE 1 TABLET BY MOUTH EVERYDAY AT BEDTIME *STOP TALKING 40MG *   methocarbamol 500 MG tablet Commonly known as: ROBAXIN Take 500 mg by mouth 4 (four) times daily as needed for muscle spasms. Taking twice a day currently   mometasone 50 MCG/ACT nasal spray Commonly known as: Nasonex 1 spray to each nostril twice a  day   montelukast 10 MG tablet Commonly known as: SINGULAIR TAKE 1 TABLET BY MOUTH EVERY DAY   multivitamin-lutein Caps capsule Take 1 capsule by mouth at bedtime.   nebivolol 5 MG tablet Commonly known as: Bystolic Take 1 tablet (5 mg total) by mouth in the morning and at bedtime. (Note dose changed from 1/2 10 mg bid to 5 mg bid)   OXYGEN Inhale 3 L into the lungs continuous.   pantoprazole 40 MG tablet Commonly known as: PROTONIX TAKE 1 TABLET BY MOUTH 2 TIMES DAILY 30 MIN BEFORE FOOD (NOTE REDUCTION IN FREQUENCY)   predniSONE 5 MG tablet Commonly known as: DELTASONE TAKE 1 TABLET BY MOUTH EVERY DAY WITH BREAKFAST   Prolia 60 MG/ML Sosy injection Generic drug: denosumab Inject 60 mg into the skin every 6 (six) months.   QUEtiapine 25 MG tablet Commonly known as: SEROQUEL TAKE 1 TABLET (25 MG TOTAL) BY MOUTH AT BEDTIME. AGAIN LAST FILL FURTHER REFILLS FROM PSYCHIATRY NO EXCEPTIONS   sucralfate 1 g tablet Commonly known as: CARAFATE TAKE 1 TABLET BY MOUTH 2 TIMES DAILY.   torsemide 20 MG tablet Commonly known as: DEMADEX Take  2 tablets (40 mg total) by mouth daily.   traMADol 50 MG tablet Commonly known as: ULTRAM Take 1 tablet (50 mg total) by mouth 2 (two) times daily.        Allergies  Allergen Reactions   Ceftin [Cefuroxime Axetil] Anaphylaxis   Lisinopril Anaphylaxis   Sulfa Antibiotics Other (See Comments)    Face swelling   Sulfasalazine Anaphylaxis   Morphine Other (See Comments)    Per patient, low blood pressure issues that requires action to raise it back up. Can take small infrequent doses   Xarelto [Rivaroxaban] Other (See Comments)    Stomach burning, bleeding, and tar in stool   Adhesive [Tape] Rash    Paper tape and tega derm OK   Antihistamines, Chlorpheniramine-Type Other (See Comments)    Makes pt hyper   Antivert [Meclizine Hcl] Other (See Comments)    Bladder will not empty   Aspirin Other (See Comments)    Sulfasalazine allergy cross reacts   Contrast Media [Iodinated Contrast Media] Rash    she is able to use betadine scrubs.   Decongestant [Pseudoephedrine Hcl] Other (See Comments)    Makes pt hyper   Doxycycline Other (See Comments)    GI upset   Levaquin [Levofloxacin In D5w] Rash   Polymyxin B Other (See Comments)    Facial rash   Tetanus Toxoids Rash and Other (See Comments)    Fever and hot to touch at injection site    Consultations: None   Procedures/Studies: US Venous Img Lower Bilateral (DVT)  Result Date: 07/16/2022 CLINICAL DATA:  Bilateral lower extremity swelling EXAM: BILATERAL LOWER EXTREMITY VENOUS DOPPLER ULTRASOUND TECHNIQUE: Gray-scale sonography with compression, as well as color and duplex ultrasound, were performed to evaluate the deep venous system(s) from the level of the common femoral vein through the popliteal and proximal calf veins. COMPARISON:  None Available. FINDINGS: VENOUS Normal compressibility of the common femoral, superficial femoral, and popliteal veins, as well as the visualized calf veins. Visualized portions of profunda  femoral vein and great saphenous vein unremarkable. No filling defects to suggest DVT on grayscale or color Doppler imaging. Doppler waveforms show normal direction of venous flow, normal respiratory plasticity and response to augmentation. Limited views of the contralateral common femoral vein are unremarkable. OTHER Left Baker's cyst measures up to 3.7 cm. Limitations: none  IMPRESSION: No evidence of lower extremity DVT. Left Baker's cyst. Electronically Signed   By: Charlett Nose M.D.   On: 07/16/2022 23:09   CT CHEST WO CONTRAST  Addendum Date: 07/16/2022   ADDENDUM REPORT: 07/16/2022 19:56 ADDENDUM: Upon further evaluation, an acute, nondisplaced fracture of the posteromedial first right rib is seen (axial CT images 8 through 14, CT series 4). This represents a new finding when compared to the prior study. Multiple chronic bilateral posterolateral fractures are also identified. Electronically Signed   By: Aram Candela M.D.   On: 07/16/2022 19:56   Result Date: 07/16/2022 CLINICAL DATA:  Shortness of breath with exertion. EXAM: CT CHEST WITHOUT CONTRAST TECHNIQUE: Multidetector CT imaging of the chest was performed following the standard protocol without IV contrast. RADIATION DOSE REDUCTION: This exam was performed according to the departmental dose-optimization program which includes automated exposure control, adjustment of the mA and/or kV according to patient size and/or use of iterative reconstruction technique. COMPARISON:  September 08, 2021 FINDINGS: Cardiovascular: There is moderate severity calcification of the thoracic aorta, without evidence of aortic aneurysm. Normal heart size with marked severity coronary artery calcification. No pericardial effusion. Mediastinum/Nodes: No enlarged mediastinal or axillary lymph nodes. Thyroid gland, trachea, and esophagus demonstrate no significant findings. Lungs/Pleura: Mild diffuse interstitial thickening is seen involving predominantly the bilateral  upper lobes and bilateral apices. Mild, hazy posterior right upper lobe, posteromedial right lower lobe and posterior left lower lobe infiltrates are seen. This is decreased in severity when compared to the prior study. Mild, adjacent areas of right upper lobe and bilateral lower lobe scarring and/or atelectasis are seen. There are small bilateral pleural effusions. No pneumothorax is identified. Upper Abdomen: No acute abnormality. Musculoskeletal: Bilateral shoulder replacements are seen. There is exaggerated kyphosis of the thoracic spine with multilevel degenerative changes. IMPRESSION: 1. Mild diffuse interstitial thickening with mild right upper lobe, posteromedial right lower lobe and posterior left lower lobe infiltrates. 2. Small bilateral pleural effusions. 3. Marked severity coronary artery calcification. 4. Aortic atherosclerosis. Aortic Atherosclerosis (ICD10-I70.0). Electronically Signed: By: Aram Candela M.D. On: 07/16/2022 18:44   DG Chest 2 View  Result Date: 07/16/2022 CLINICAL DATA:  sob EXAM: CHEST - 2 VIEW COMPARISON:  CXR 06/04/22 FINDINGS: Bilateral shoulder arthroplasties. Small bilateral pleural effusions. Unchanged cardiac contours. There is asymmetric prominence of the right hilum/infrahilar region, which could represent an underlying airspace opacity redemonstrated chronic right-sided rib fractures. No definite radiographic evidence of a new displaced rib fracture. Visualized upper abdomen is unremarkable. Vertebral body heights are maintained. Partially visualized thoracolumbar spinal fusion hardware in place. IMPRESSION: 1.  Small bilateral pleural effusions, unchanged. 2. Possible focal opacity in the right infrahilar region, which could represent atelectasis or infection. Electronically Signed   By: Lorenza Cambridge M.D.   On: 07/16/2022 15:30      Subjective: Seen and examined on day of DC.  Euvolemic, stable for discharge  Discharge Exam: Vitals:   07/19/22 0801  07/19/22 1203  BP: 128/61 (!) 134/54  Pulse: 69 70  Resp: 16 16  Temp: 97.8 F (36.6 C) 97.6 F (36.4 C)  SpO2: 95% 96%   Vitals:   07/19/22 0500 07/19/22 0522 07/19/22 0801 07/19/22 1203  BP:  (!) 152/63 128/61 (!) 134/54  Pulse:  80 69 70  Resp:  12 16 16   Temp:  (!) 97.5 F (36.4 C) 97.8 F (36.6 C) 97.6 F (36.4 C)  TempSrc:   Oral   SpO2:  95% 95% 96%  Weight: 78.1 kg  Height:        General: Pt is alert, awake, not in acute distress Cardiovascular: RRR, S1/S2 +, no rubs, no gallops Respiratory: CTA bilaterally, no wheezing, no rhonchi Abdominal: Soft, NT, ND, bowel sounds + Extremities: no edema, no cyanosis    The results of significant diagnostics from this hospitalization (including imaging, microbiology, ancillary and laboratory) are listed below for reference.     Microbiology: Recent Results (from the past 240 hour(s))  Blood culture (routine x 2)     Status: None (Preliminary result)   Collection Time: 07/16/22  9:25 PM   Specimen: BLOOD  Result Value Ref Range Status   Specimen Description BLOOD LEFT ANTECUBITAL  Final   Special Requests   Final    BOTTLES DRAWN AEROBIC AND ANAEROBIC Blood Culture adequate volume   Culture   Final    NO GROWTH 3 DAYS Performed at [facility], [address], [place], [place] [address]    Report Status PENDING  Incomplete  Blood culture (routine x 2)     Status: None (Preliminary result)   Collection Time: 07/16/22  9:27 PM   Specimen: BLOOD  Result Value Ref Range Status   Specimen Description BLOOD RIGHT ANTECUBITAL  Final   Special Requests   Final    BOTTLES DRAWN AEROBIC AND ANAEROBIC Blood Culture adequate volume   Culture   Final    NO GROWTH 3 DAYS Performed at [facility], [address], [place], [place] [address]    Report Status PENDING  Incomplete  SARS Coronavirus 2 by RT PCR (hospital order, performed in [facility] hospital lab) *cepheid single result test* Anterior  Nasal Swab     Status: None   Collection Time: 07/16/22 11:45 PM   Specimen: Anterior Nasal Swab  Result Value Ref Range Status   SARS Coronavirus 2 by RT PCR NEGATIVE NEGATIVE Final    Comment: (NOTE) SARS-CoV-2 target nucleic acids are NOT DETECTED.  The SARS-CoV-2 RNA is generally detectable in upper and lower respiratory specimens during the acute phase of infection. The lowest concentration of SARS-CoV-2 viral copies this assay can detect is 250 copies / mL. A negative result does not preclude SARS-CoV-2 infection and should not be used as the sole basis for treatment or other patient management decisions.  A negative result may occur with improper specimen collection / handling, submission of specimen other than nasopharyngeal swab, presence of viral mutation(s) within the areas targeted by this assay, and inadequate number of viral copies (<250 copies / mL). A negative result must be combined with clinical observations, patient history, and epidemiological information.  Fact Sheet for Patients:   RoadLapTop.co.za  Fact Sheet for Healthcare Providers: http://kim-miller.com/  This test is not yet approved or  cleared by the [place] FDA and has been authorized for detection and/or diagnosis of SARS-CoV-2 by FDA under an Emergency Use Authorization (EUA).  This EUA will remain in effect (meaning this test can be used) for the duration of the COVID-19 declaration under Section 564(b)(1) of the Act, 21 U.S.C. section 360bbb-3(b)(1), unless the authorization is terminated or revoked sooner.  Performed at [facility], [address] [place], [place] [address]   Respiratory (~20 pathogens) panel by PCR     Status: None   Collection Time: 07/16/22 11:45 PM   Specimen: Nasopharyngeal Swab; Respiratory  Result Value Ref Range Status   Adenovirus NOT DETECTED NOT DETECTED Final   Coronavirus 229E NOT DETECTED NOT  DETECTED Final  Comment: (NOTE) The Coronavirus on the Respiratory Panel, DOES NOT test for the novel  Coronavirus (2019 nCoV)    Coronavirus HKU1 NOT DETECTED NOT DETECTED Final   Coronavirus NL63 NOT DETECTED NOT DETECTED Final   Coronavirus OC43 NOT DETECTED NOT DETECTED Final   Metapneumovirus NOT DETECTED NOT DETECTED Final   Rhinovirus / Enterovirus NOT DETECTED NOT DETECTED Final   Influenza A NOT DETECTED NOT DETECTED Final   Influenza B NOT DETECTED NOT DETECTED Final   Parainfluenza Virus 1 NOT DETECTED NOT DETECTED Final   Parainfluenza Virus 2 NOT DETECTED NOT DETECTED Final   Parainfluenza Virus 3 NOT DETECTED NOT DETECTED Final   Parainfluenza Virus 4 NOT DETECTED NOT DETECTED Final   Respiratory Syncytial Virus NOT DETECTED NOT DETECTED Final   Bordetella pertussis NOT DETECTED NOT DETECTED Final   Bordetella Parapertussis NOT DETECTED NOT DETECTED Final   Chlamydophila pneumoniae NOT DETECTED NOT DETECTED Final   Mycoplasma pneumoniae NOT DETECTED NOT DETECTED Final    Comment: Performed at [facility], [address], [place], [place] [address]     Labs: BNP (last 3 results) Recent Labs    03/28/22 0104 03/30/22 0908 07/16/22 1437  BNP 450.9* 933.7* 446.2*   Basic Metabolic Panel: Recent Labs  Lab 07/16/22 1437 07/17/22 0519  NA 139 139  K 4.9 3.5  CL 104 101  CO2 23 26  GLUCOSE 141* 91  BUN 18 17  CREATININE 1.21* 1.16*  CALCIUM 9.1 8.8*   Liver Function Tests: Recent Labs  Lab 07/16/22 2339  AST 23  ALT 16  ALKPHOS 81  BILITOT 0.7  PROT 7.1  ALBUMIN 3.6   No results for input(s): "LIPASE", "AMYLASE" in the last 168 hours. No results for input(s): "AMMONIA" in the last 168 hours. CBC: Recent Labs  Lab 07/16/22 1437 07/17/22 0519  WBC 10.0 9.0  HGB 11.4* 11.0*  HCT 36.9 35.0*  MCV 87.2 86.8  PLT 232 208   Cardiac Enzymes: No results for input(s): "CKTOTAL", "CKMB", "CKMBINDEX", "TROPONINI" in the last 168  hours. BNP: Invalid input(s): "POCBNP" CBG: No results for input(s): "GLUCAP" in the last 168 hours. D-Dimer No results for input(s): "DDIMER" in the last 72 hours. Hgb A1c Recent Labs    07/17/22 0520  HGBA1C 6.2*   Lipid Profile No results for input(s): "CHOL", "HDL", "LDLCALC", "TRIG", "CHOLHDL", "LDLDIRECT" in the last 72 hours. Thyroid function studies No results for input(s): "TSH", "T4TOTAL", "T3FREE", "THYROIDAB" in the last 72 hours.  Invalid input(s): "FREET3" Anemia work up Recent Labs    07/16/22 2339 07/17/22 0512 07/17/22 0519  VITAMINB12  --  301  --   FOLATE >40.0  --   --   FERRITIN 75  --   --   TIBC 379  --   --   IRON 79  --   --   RETICCTPCT  --   --  1.7   Urinalysis    Component Value Date/Time   COLORURINE YELLOW (A) 09/08/2021 0726   APPEARANCEUR CLEAR (A) 09/08/2021 0726   APPEARANCEUR Clear 10/12/2017 1410   LABSPEC 1.013 09/08/2021 0726   LABSPEC 1.003 11/24/2013 2117   PHURINE 5.0 09/08/2021 0726   GLUCOSEU NEGATIVE 09/08/2021 0726   GLUCOSEU Negative 11/24/2013 2117   HGBUR NEGATIVE 09/08/2021 0726   BILIRUBINUR NEGATIVE 09/08/2021 0726   BILIRUBINUR Negative 10/12/2017 1410   BILIRUBINUR Negative 11/24/2013 2117   KETONESUR NEGATIVE 09/08/2021 0726   PROTEINUR NEGATIVE 09/08/2021 0726   NITRITE NEGATIVE 09/08/2021 0726  LEUKOCYTESUR NEGATIVE 09/08/2021 0726   LEUKOCYTESUR Negative 11/24/2013 2117   Sepsis Labs Recent Labs  Lab 07/16/22 1437 07/17/22 0519  WBC 10.0 9.0   Microbiology Recent Results (from the past 240 hour(s))  Blood culture (routine x 2)     Status: None (Preliminary result)   Collection Time: 07/16/22  9:25 PM   Specimen: BLOOD  Result Value Ref Range Status   Specimen Description BLOOD LEFT ANTECUBITAL  Final   Special Requests   Final    BOTTLES DRAWN AEROBIC AND ANAEROBIC Blood Culture adequate volume   Culture   Final    NO GROWTH 3 DAYS Performed at [facility], [address] [place], [place] [address]    Report Status PENDING  Incomplete  Blood culture (routine x 2)     Status: None (Preliminary result)   Collection Time: 07/16/22  9:27 PM   Specimen: BLOOD  Result Value Ref Range Status   Specimen Description BLOOD RIGHT ANTECUBITAL  Final   Special Requests   Final    BOTTLES DRAWN AEROBIC AND ANAEROBIC Blood Culture adequate volume   Culture   Final    NO GROWTH 3 DAYS Performed at [facility], [address] [place], [place] [address]    Report Status PENDING  Incomplete  SARS Coronavirus 2 by RT PCR (hospital order, performed in [facility] hospital lab) *cepheid single result test* Anterior Nasal Swab     Status: None   Collection Time: 07/16/22 11:45 PM   Specimen: Anterior Nasal Swab  Result Value Ref Range Status   SARS Coronavirus 2 by RT PCR NEGATIVE NEGATIVE Final    Comment: (NOTE) SARS-CoV-2 target nucleic acids are NOT DETECTED.  The SARS-CoV-2 RNA is generally detectable in upper and lower respiratory specimens during the acute phase of infection. The lowest concentration of SARS-CoV-2 viral copies this assay can detect is 250 copies / mL. A negative result does not preclude SARS-CoV-2 infection and should not be used as the sole basis for treatment or other patient management decisions.  A negative result may occur with improper specimen collection / handling, submission of specimen other than nasopharyngeal swab, presence of viral mutation(s) within the areas targeted by this assay, and inadequate number of viral copies (<250 copies / mL). A negative result must be combined with clinical observations, patient history, and epidemiological information.  Fact Sheet for Patients:   RoadLapTop.co.za  Fact Sheet for Healthcare Providers: http://kim-miller.com/  This test is not yet approved or  cleared by the [place] FDA and has been authorized for detection and/or  diagnosis of SARS-CoV-2 by FDA under an Emergency Use Authorization (EUA).  This EUA will remain in effect (meaning this test can be used) for the duration of the COVID-19 declaration under Section 564(b)(1) of the Act, 21 U.S.C. section 360bbb-3(b)(1), unless the authorization is terminated or revoked sooner.  Performed at [facility], [address] [place], [place] [address]   Respiratory (~20 pathogens) panel by PCR     Status: None   Collection Time: 07/16/22 11:45 PM   Specimen: Nasopharyngeal Swab; Respiratory  Result Value Ref Range Status   Adenovirus NOT DETECTED NOT DETECTED Final   Coronavirus 229E NOT DETECTED NOT DETECTED Final    Comment: (NOTE) The Coronavirus on the Respiratory Panel, DOES NOT test for the novel  Coronavirus (2019 nCoV)    Coronavirus HKU1 NOT DETECTED NOT DETECTED Final   Coronavirus NL63 NOT DETECTED NOT DETECTED Final   Coronavirus OC43  NOT DETECTED NOT DETECTED Final   Metapneumovirus NOT DETECTED NOT DETECTED Final   Rhinovirus / Enterovirus NOT DETECTED NOT DETECTED Final   Influenza A NOT DETECTED NOT DETECTED Final   Influenza B NOT DETECTED NOT DETECTED Final   Parainfluenza Virus 1 NOT DETECTED NOT DETECTED Final   Parainfluenza Virus 2 NOT DETECTED NOT DETECTED Final   Parainfluenza Virus 3 NOT DETECTED NOT DETECTED Final   Parainfluenza Virus 4 NOT DETECTED NOT DETECTED Final   Respiratory Syncytial Virus NOT DETECTED NOT DETECTED Final   Bordetella pertussis NOT DETECTED NOT DETECTED Final   Bordetella Parapertussis NOT DETECTED NOT DETECTED Final   Chlamydophila pneumoniae NOT DETECTED NOT DETECTED Final   Mycoplasma pneumoniae NOT DETECTED NOT DETECTED Final    Comment: Performed at [facility], [address], [place], [place] [address]     Time coordinating discharge: Over 30 minutes  SIGNED:   Tresa Moore, MD  [facility] 07/19/2022, 4:05 PM Pager   If 7PM-7AM, please contact  night-coverage

## 2022-07-19 NOTE — Progress Notes (Signed)
MD order received in [facility] to discharge pt home with home health PT; TOC previously arranged HHPT services with Rozell Searing to call the pt in the am to set up services; verbally reviewed AVS with pt including [facility] [place], [place]; no questions voiced by the pt at this time; pt discharged via wheelchair by nursing on her personal O2 at [address] to the [facility] [place] entrance

## 2022-07-20 ENCOUNTER — Telehealth: Payer: Self-pay

## 2022-07-20 ENCOUNTER — Telehealth: Payer: Self-pay | Admitting: *Deleted

## 2022-07-20 ENCOUNTER — Telehealth: Payer: Self-pay | Admitting: Pulmonary Disease

## 2022-07-20 LAB — CULTURE, BLOOD (ROUTINE X 2)
Culture: NO GROWTH
Special Requests: ADEQUATE

## 2022-07-20 MED ORDER — TRELEGY ELLIPTA 100-62.5-25 MCG/ACT IN AEPB: 1.0000 | INHALATION_SPRAY | Freq: Every day | RESPIRATORY_TRACT | 6 refills | Status: DC

## 2022-07-20 MED ORDER — MONTELUKAST SODIUM 10 MG PO TABS: 10.0000 mg | ORAL_TABLET | Freq: Every day | ORAL | 2 refills | Status: DC

## 2022-07-20 MED FILL — Iron Sucrose Inj 20 MG/ML (Fe Equiv): INTRAVENOUS | Qty: 10 | Status: AC

## 2022-07-20 NOTE — Telephone Encounter (Signed)
Patient is aware of below message and voiced her understanding. She is willing to try trelegy. Trelegy 100 and Singulair sent to pharmacy. Verified trelegy dose with Dr. Jayme Cloud verbally.   Appt scheduled with Beth for 08/21/2022 at 11:30. Nothing further needed.

## 2022-07-20 NOTE — Telephone Encounter (Signed)
Patient states needs appointment for hospital follow up with Dr. Jayme Cloud. Patient states needs Singulair called into pharmacy. Pharmacy is [facility] . Also would like to know if she needs to continue taking inhalers. Patient phone number is [phone].

## 2022-07-20 NOTE — Telephone Encounter (Signed)
Pt informed and will be here tomorrow

## 2022-07-20 NOTE — Telephone Encounter (Signed)
In the past she could not afford Trelegy which is essentially the [facility] with the Incruse.  We could try her on Trelegy again to see if it is covered by her insurance.  Because of the current situation with my schedule she should see the NP first.  I am usually around and the NP can confer with me on the day she follows up.  Keep the appointment for August.  Can send in the prescription for Singulair.

## 2022-07-20 NOTE — Telephone Encounter (Signed)
Patient called stating she missed her Lab appointment on 6/14 due to being the hospital. Patient states while in the hospital they drew labs  and was wondering if MD could use those lab results for upcoming appointment with MD on 6/18. Patient had a cbc drawn on 6/14 and on 6/13 she had ferritin and iron drawn. Plus additional labs.

## 2022-07-20 NOTE — Transitions of Care (Post Inpatient/ED Visit) (Signed)
   07/20/2022  Name: Kerri Carter MRN: [id number] DOB: April 01, 1940  Today's TOC FU Call Status: Today's TOC FU Call Status:: Unsuccessul Call (1st Attempt) Unsuccessful Call (1st Attempt) Date: 07/20/22  Attempted to reach the patient regarding the most recent Inpatient/ED visit.  Follow Up Plan: Additional outreach attempts will be made to reach the patient to complete the Transitions of Care (Post Inpatient/ED visit) call.   [name] BSN RN [facility] [phone]

## 2022-07-20 NOTE — Telephone Encounter (Signed)
Called and spoke to patient.  She is requesting HFU. Currently scheduled to see Dr. Jayme Cloud 09/17/2022 I have offered  sooner appt with NP and patient stated that she prefers to see Dr. Jayme Cloud only.  She is requesting Rx for Singulair. Rx originally prescribed by PCP.  She did not tolerate Breztri. Hospital d/c Pulmicort and prescribed incruse and Breo. She is questioning if she should continue these medications.  [facility]   Dr. Jayme Cloud, please advise. Thanks

## 2022-07-21 ENCOUNTER — Inpatient Hospital Stay: Payer: Medicare PPO

## 2022-07-21 ENCOUNTER — Telehealth: Payer: Self-pay | Admitting: *Deleted

## 2022-07-21 ENCOUNTER — Inpatient Hospital Stay: Payer: Medicare PPO | Admitting: Oncology

## 2022-07-21 LAB — PROTEIN ELECTROPHORESIS, SERUM
A/G Ratio: 0.9 (ref 0.7–1.7)
Albumin ELP: 3.2 g/dL (ref 2.9–4.4)
Alpha-1-Globulin: 0.3 g/dL (ref 0.0–0.4)
Alpha-2-Globulin: 1 g/dL (ref 0.4–1.0)
Beta Globulin: 1 g/dL (ref 0.7–1.3)
Gamma Globulin: 1.1 g/dL (ref 0.4–1.8)
Globulin, Total: 3.5 g/dL (ref 2.2–3.9)
Total Protein ELP: 6.7 g/dL (ref 6.0–8.5)

## 2022-07-21 LAB — CULTURE, BLOOD (ROUTINE X 2)
Culture: NO GROWTH
Special Requests: ADEQUATE

## 2022-07-21 IMAGING — CT CT CHEST W/O CM
2 of 4 series · 15 of 36 positions shown, 18 images · non-contrast
Comparison: Chest x-ray 07/22/2021, chest CT 03/21/2011, 05/01/2020

CLINICAL DATA: Respiratory illness cough with leg swelling



[Series 2: thorax · axial · 0.73mm/px · z∈[-414,-192]mm · 12 of 133 slices shown, 15 images]
[im 11/133  mediastinal]
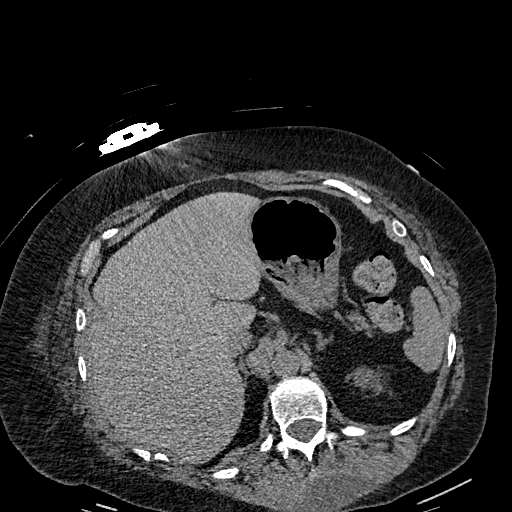
[im 11/133  lung]
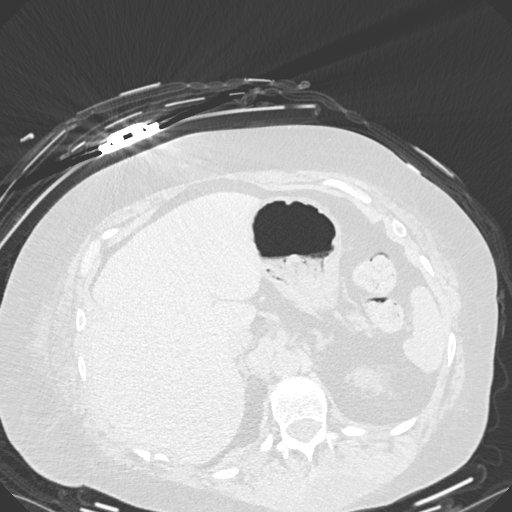
[im 21/133  lung]
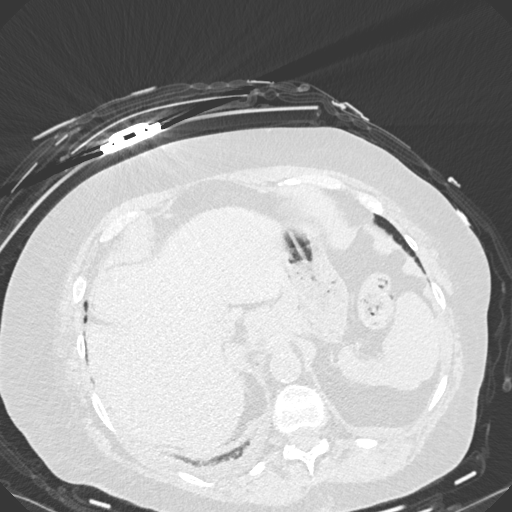
[im 31/133  lung]
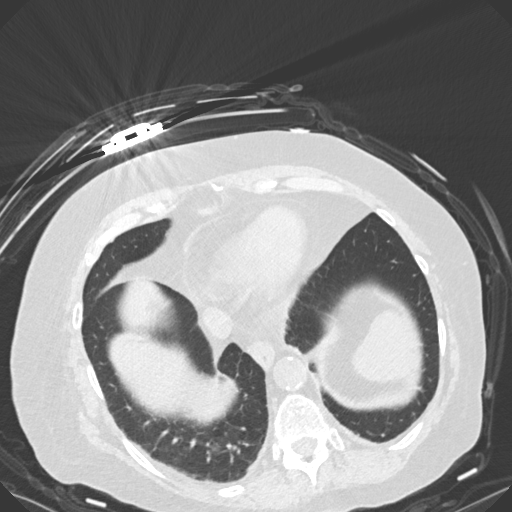
[im 41/133  lung]
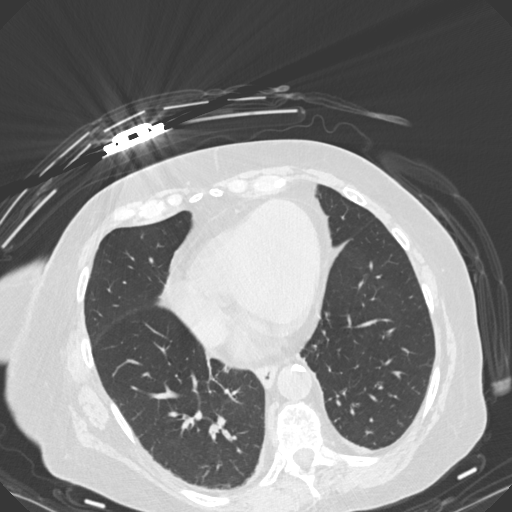
[im 51/133  mediastinal]
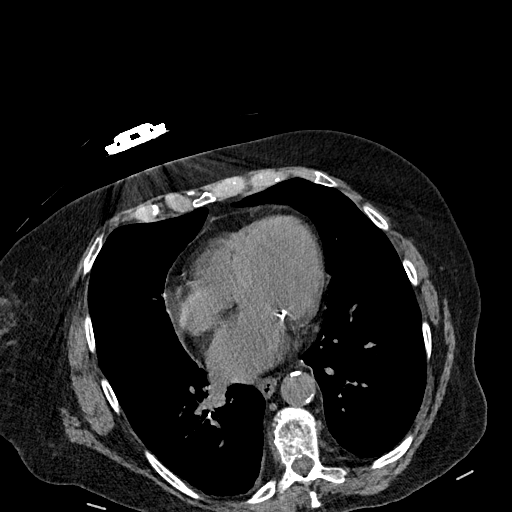
[im 51/133  lung]
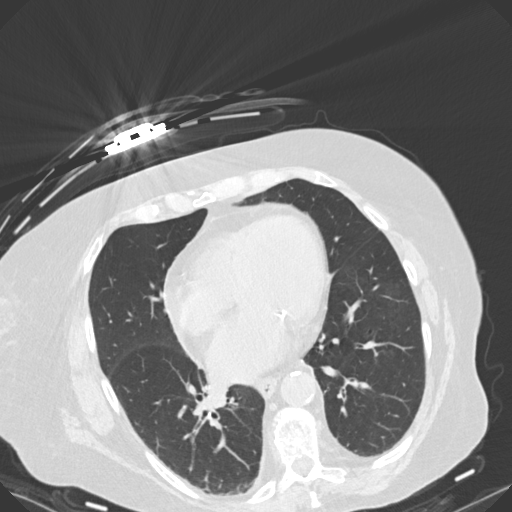
[im 61/133  lung]
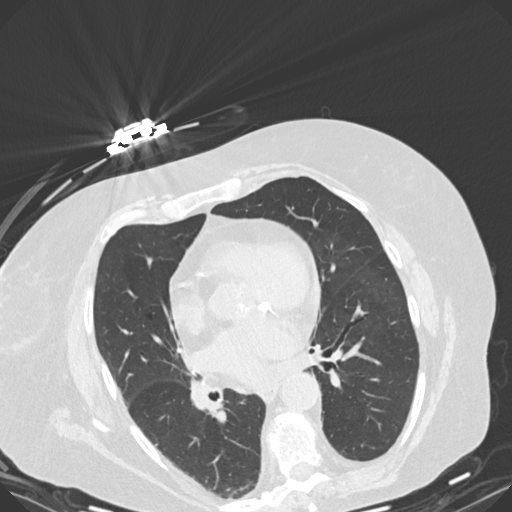
[im 72/133  lung]
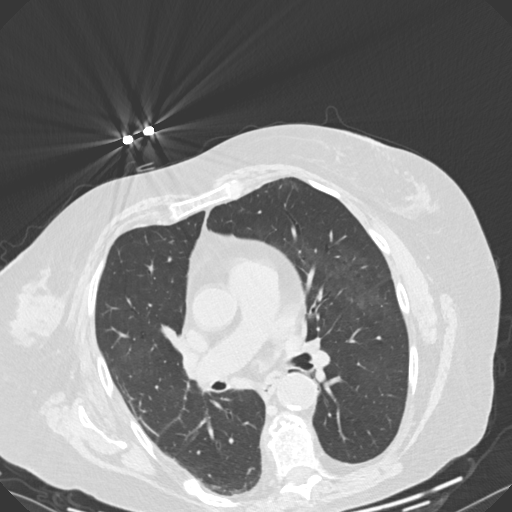
[im 82/133  lung]
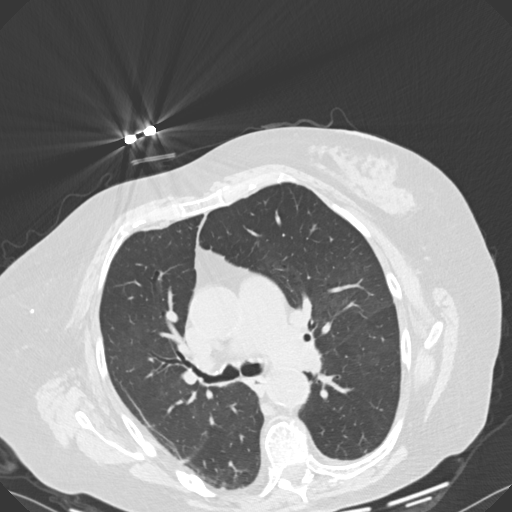
[im 92/133  mediastinal]
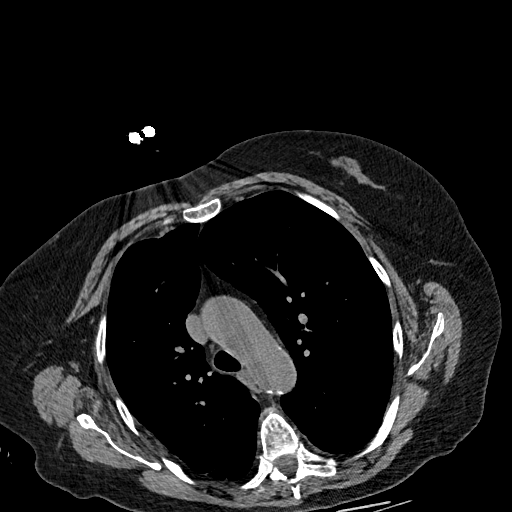
[im 92/133  lung]
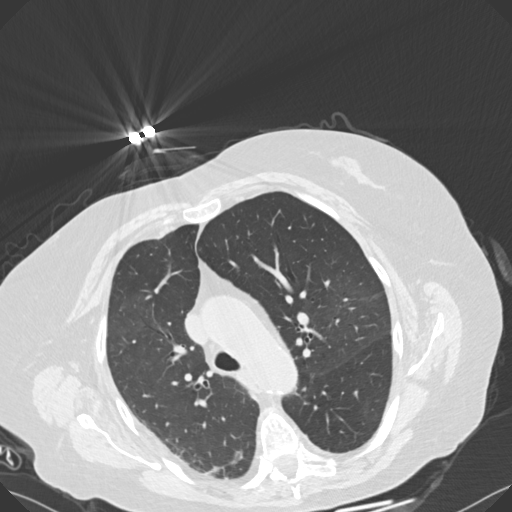
[im 102/133  lung]
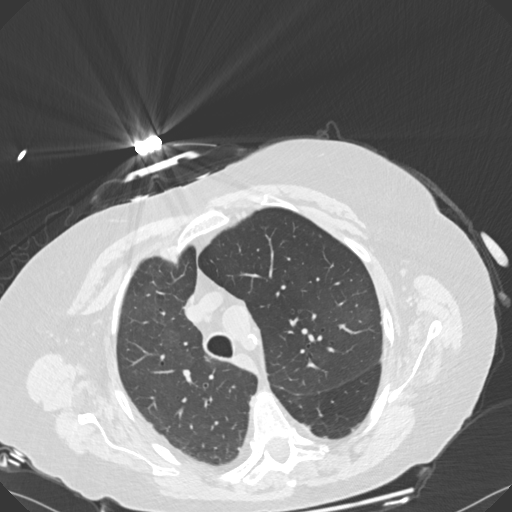
[im 112/133  lung]
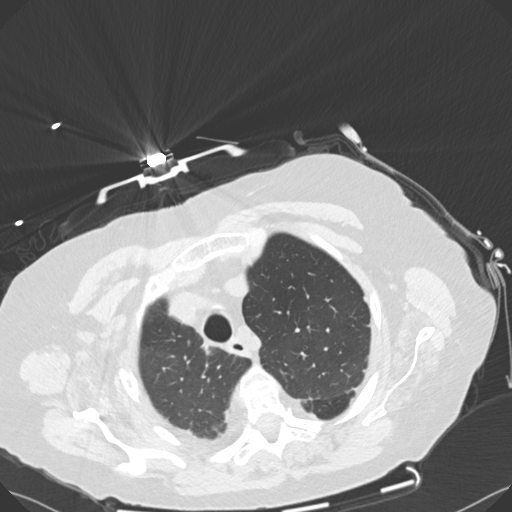
[im 122/133  lung]
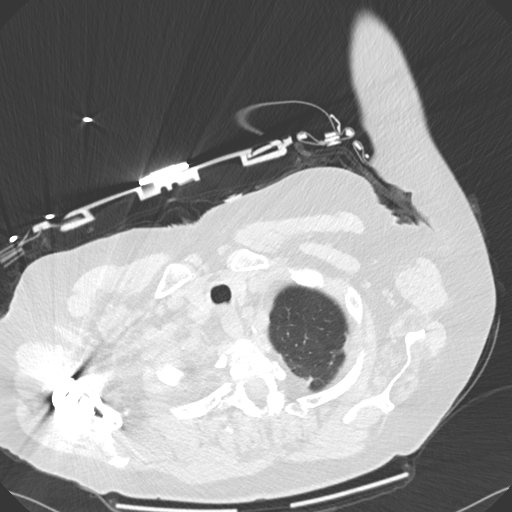

[Series 5: coronal · coronal · 0.62mm/px · 3 of 141 slices shown]
[im 29/141  lung]
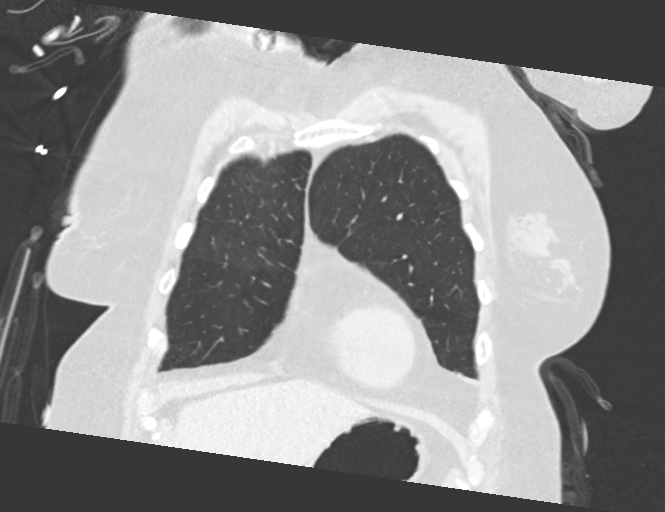
[im 57/141  lung]
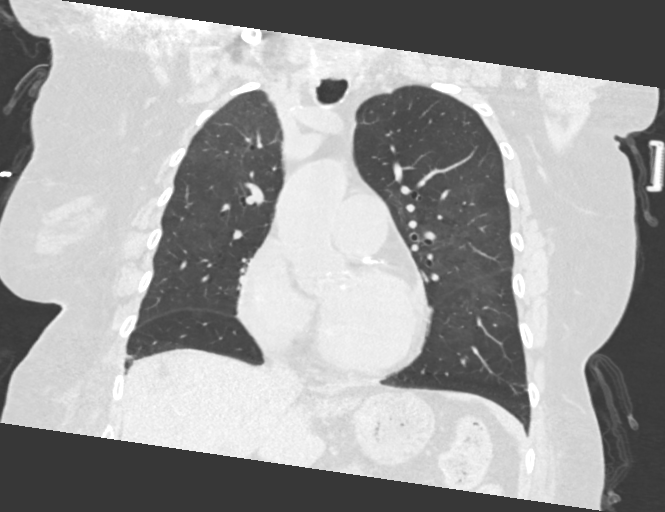
[im 85/141  lung]
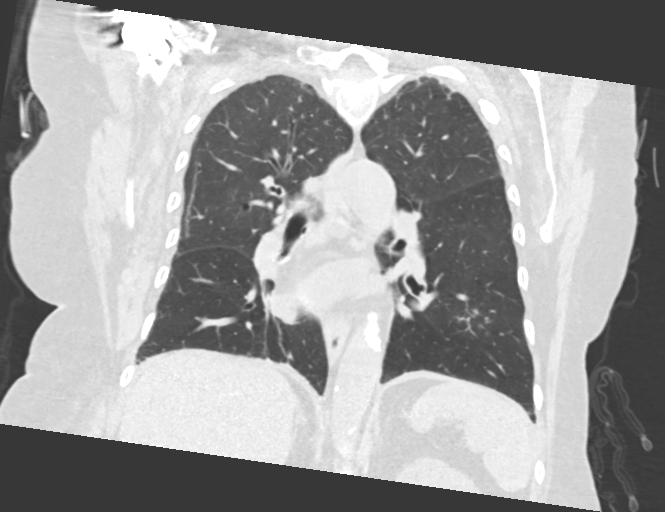

[15 of 36 positions shown; findings below may reference images not displayed]

FINDINGS: Cardiovascular: Limited evaluation without intravenous contrast.
Moderate aortic atherosclerosis. No aneurysm. Coronary vascular
calcifications. No pericardial effusion. Mitral calcification.

Mediastinum/Nodes: Midline trachea. No thyroid mass. No suspicious
lymph nodes. Esophagus within normal limits.

Lungs/Pleura: No consolidation, pleural effusion or pneumothorax.
Mild lower lobe bronchial wall thickening with scattered mucous
plugging. Mild tree-in-bud nodularity in the left lower lobe, series
3 image 79.

Upper Abdomen: No acute abnormality.

Musculoskeletal: No acute osseous abnormality. Previously noted
nodular opacity may correspond to lead attachment on the left upper
anterior chest wall.
IMPRESSION: 1. No suspicious pulmonary nodule to correspond to the questioned
radiographic abnormality.
2. Mild lower lobe bronchial wall thickening with scattered mucous
plugging. Small amount of tree-in-bud nodularity in the left lower
lobe suggesting respiratory infection.

Aortic Atherosclerosis ([id number]).

## 2022-07-21 IMAGING — CR DG CHEST 2V
1 series · 2 of 2 positions shown · non-contrast
Comparison: Chest radiograph 05/29/2021

CLINICAL DATA: Cough and swelling in the legs

EXAM:
CHEST - 2 VIEW

[Series 1: dg chest 2 view · 0.14mm/px · 2 of 2 slices shown]
[im 1/2]
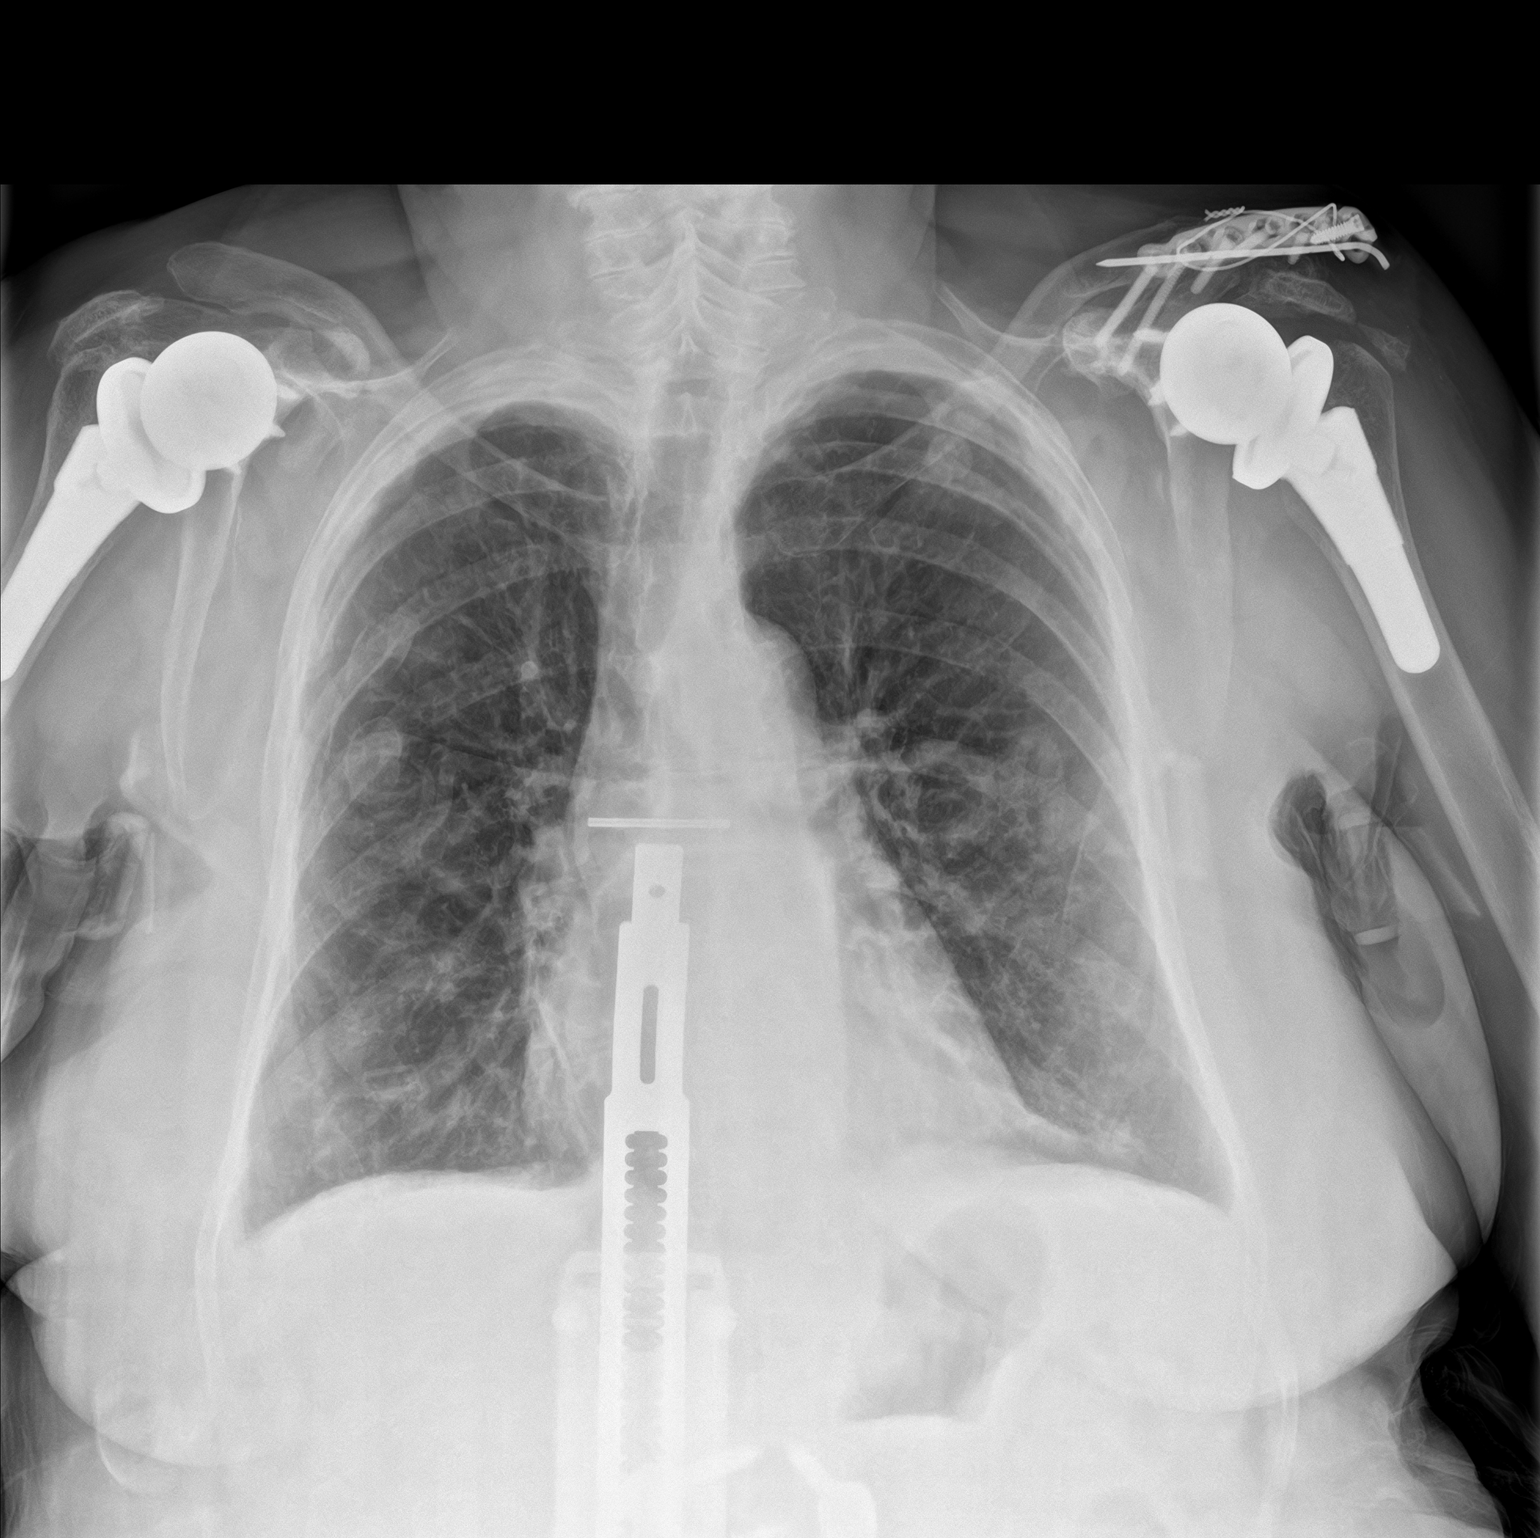
[im 2/2]
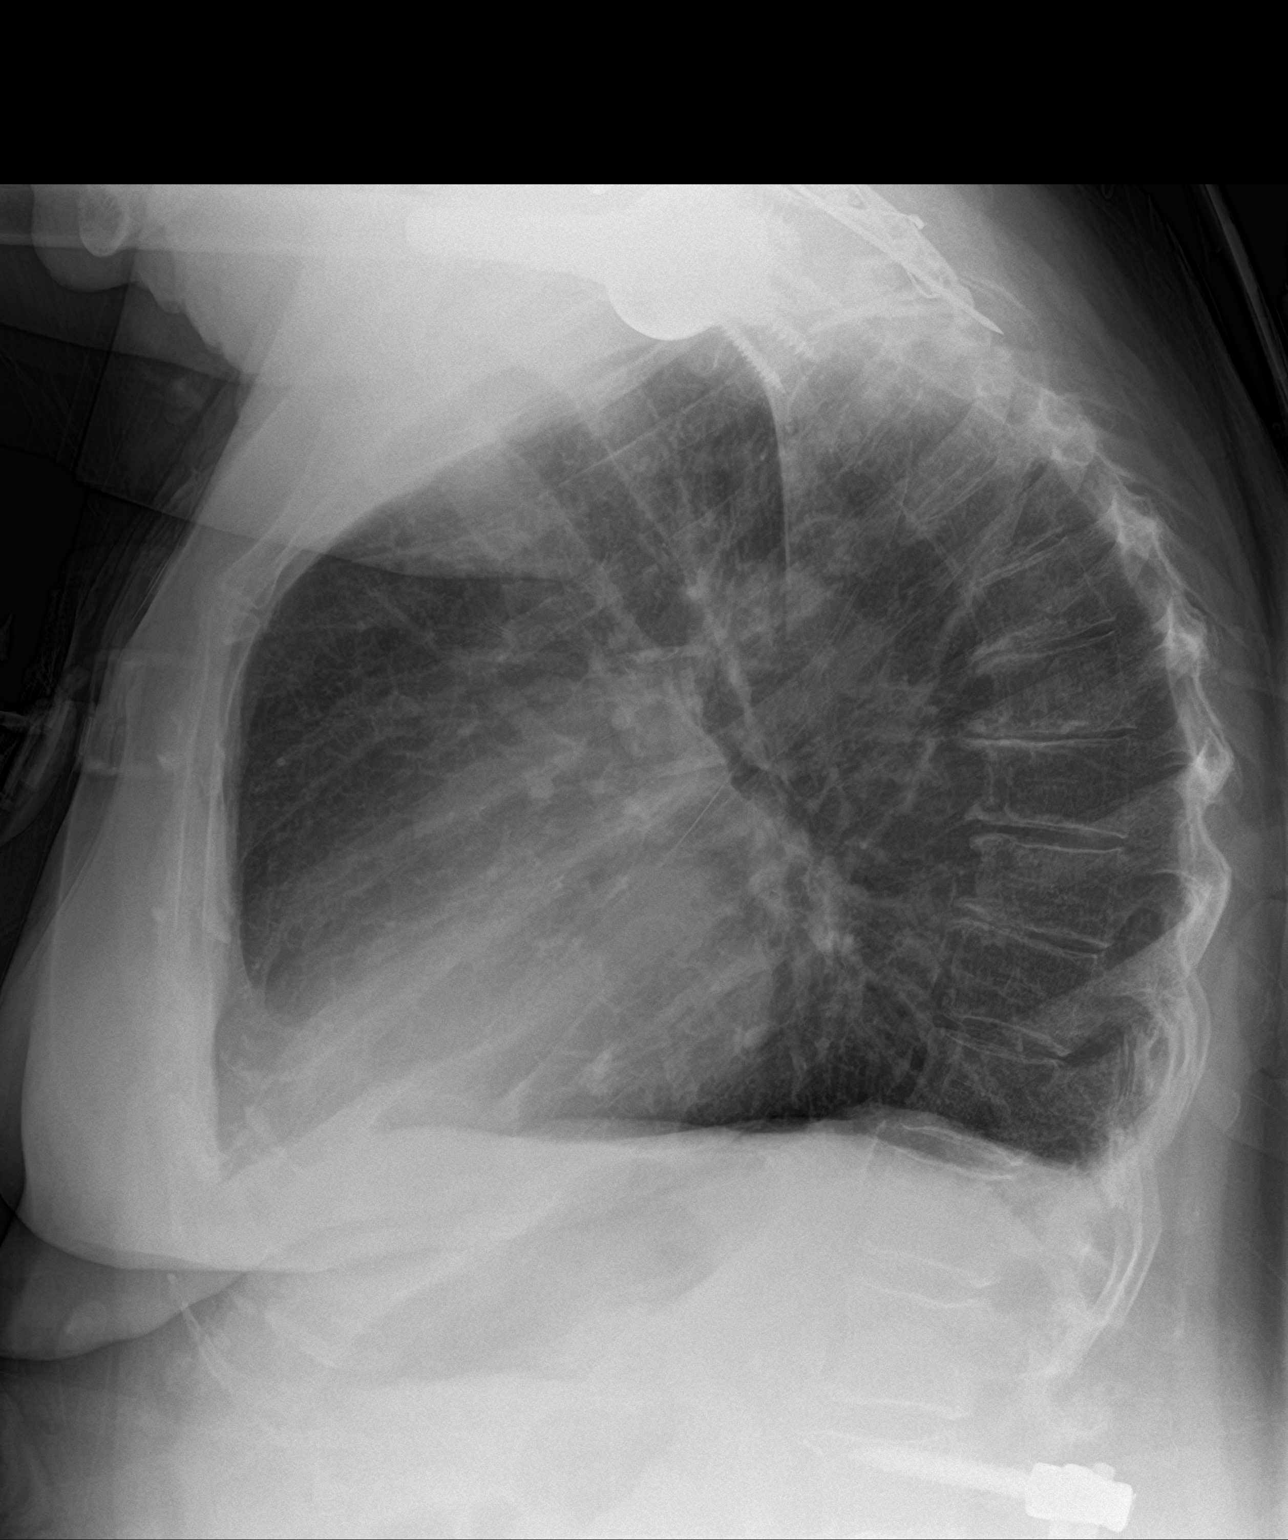

[2 of 2 positions shown; findings below may reference images not displayed]

FINDINGS: The cardiomediastinal silhouette is normal.

The lungs hyperinflated with flattening of the diaphragms consistent
with underlying COPD. There is no focal consolidation or pulmonary
edema. Previously seen opacities in the left lung have significantly
improved/resolved. There is no pleural effusion or pneumothorax.

There is a 1.3 cm nodular opacity in the left apex not seen on prior
studies.

Bilateral shoulder arthroplasty hardware, left clavicle surgical
fixation, and lumbar spine fusion hardware are noted. Opacities
projecting over the right fifth and sixth ribs are consistent with
reflect old fractures.
IMPRESSION: 1. No radiographic evidence of acute cardiopulmonary process.
Previously seen opacities in the left lung on the study from
05/29/2021 have resolved.
2. 1.3 cm nodular opacity in the left apex not seen on prior
studies. Recommend nonemergent chest CT to evaluate for a pulmonary
nodule.

## 2022-07-21 NOTE — Transitions of Care (Post Inpatient/ED Visit) (Signed)
   07/21/2022  Name: ALLYX PINNIX MRN: [id number] DOB: 06/03/1940  Today's TOC FU Call Status: Today's TOC FU Call Status:: Unsuccessful Call (2nd Attempt) Unsuccessful Call (2nd Attempt) Date: 07/21/22  Attempted to reach the patient regarding the most recent Inpatient/ED visit.  Follow Up Plan: Additional outreach attempts will be made to reach the patient to complete the Transitions of Care (Post Inpatient/ED visit) call.   [name] BSN RN [facility] [phone]

## 2022-07-22 ENCOUNTER — Telehealth: Payer: Self-pay | Admitting: *Deleted

## 2022-07-22 ENCOUNTER — Inpatient Hospital Stay: Payer: Medicare PPO | Admitting: Family Medicine

## 2022-07-22 DIAGNOSIS — I5043 Acute on chronic combined systolic (congestive) and diastolic (congestive) heart failure: Secondary | ICD-10-CM | POA: Diagnosis not present

## 2022-07-22 DIAGNOSIS — F4323 Adjustment disorder with mixed anxiety and depressed mood: Secondary | ICD-10-CM | POA: Diagnosis not present

## 2022-07-22 DIAGNOSIS — N1831 Chronic kidney disease, stage 3a: Secondary | ICD-10-CM | POA: Diagnosis not present

## 2022-07-22 DIAGNOSIS — I480 Paroxysmal atrial fibrillation: Secondary | ICD-10-CM | POA: Diagnosis not present

## 2022-07-22 DIAGNOSIS — D631 Anemia in chronic kidney disease: Secondary | ICD-10-CM | POA: Diagnosis not present

## 2022-07-22 DIAGNOSIS — I130 Hypertensive heart and chronic kidney disease with heart failure and stage 1 through stage 4 chronic kidney disease, or unspecified chronic kidney disease: Secondary | ICD-10-CM | POA: Diagnosis not present

## 2022-07-22 DIAGNOSIS — I2510 Atherosclerotic heart disease of native coronary artery without angina pectoris: Secondary | ICD-10-CM | POA: Diagnosis not present

## 2022-07-22 DIAGNOSIS — M800AXD Age-related osteoporosis with current pathological fracture, other site, subsequent encounter for fracture with routine healing: Secondary | ICD-10-CM | POA: Diagnosis not present

## 2022-07-22 DIAGNOSIS — F3160 Bipolar disorder, current episode mixed, unspecified: Secondary | ICD-10-CM | POA: Diagnosis not present

## 2022-07-22 NOTE — Transitions of Care (Post Inpatient/ED Visit) (Signed)
   07/22/2022  Name: Kerri Carter MRN: [id number] DOB: 1940/05/15  Today's TOC FU Call Status: Today's TOC FU Call Status:: Unsuccessful Call (3rd Attempt)  Attempted to reach the patient regarding the most recent Inpatient/ED visit.  Follow Up Plan: No further outreach attempts will be made at this time. We have been unable to contact the patient.  [name] BSN RN [facility] [phone]

## 2022-07-23 ENCOUNTER — Telehealth: Payer: Self-pay | Admitting: Family Medicine

## 2022-07-23 DIAGNOSIS — R29898 Other symptoms and signs involving the musculoskeletal system: Secondary | ICD-10-CM

## 2022-07-23 NOTE — Telephone Encounter (Signed)
Patient was suppose to have a hospital follow up today, but some how appointment was cancelled through automate phone  system . Patient has been rescheduled to 07/27/2022. She was released from the hospital on 07/19/2022. Patient already has PT coming into home, but PT would like patient to also have OT for her upper arms, pt has weak arms.

## 2022-07-24 DIAGNOSIS — J441 Chronic obstructive pulmonary disease with (acute) exacerbation: Secondary | ICD-10-CM | POA: Diagnosis not present

## 2022-07-24 NOTE — Addendum Note (Signed)
Addended by: [place], Ishani Goldwasser on: 07/24/2022 09:26 AM   Modules accepted: Orders

## 2022-07-24 NOTE — Telephone Encounter (Signed)
Does pt want OT in the home? Or does pt want OT added to the PT care that they are already receiving? Please advise and Thank you!

## 2022-07-24 NOTE — Addendum Note (Signed)
Addended by: [place], Jamilya Sarrazin on: 07/24/2022 12:00 PM   Modules accepted: Orders

## 2022-07-27 ENCOUNTER — Inpatient Hospital Stay: Payer: Medicare PPO | Admitting: Family Medicine

## 2022-07-27 DIAGNOSIS — D631 Anemia in chronic kidney disease: Secondary | ICD-10-CM | POA: Diagnosis not present

## 2022-07-27 DIAGNOSIS — F4323 Adjustment disorder with mixed anxiety and depressed mood: Secondary | ICD-10-CM | POA: Diagnosis not present

## 2022-07-27 DIAGNOSIS — I480 Paroxysmal atrial fibrillation: Secondary | ICD-10-CM | POA: Diagnosis not present

## 2022-07-27 DIAGNOSIS — F3160 Bipolar disorder, current episode mixed, unspecified: Secondary | ICD-10-CM | POA: Diagnosis not present

## 2022-07-27 DIAGNOSIS — N1831 Chronic kidney disease, stage 3a: Secondary | ICD-10-CM | POA: Diagnosis not present

## 2022-07-27 DIAGNOSIS — I130 Hypertensive heart and chronic kidney disease with heart failure and stage 1 through stage 4 chronic kidney disease, or unspecified chronic kidney disease: Secondary | ICD-10-CM | POA: Diagnosis not present

## 2022-07-27 DIAGNOSIS — M800AXD Age-related osteoporosis with current pathological fracture, other site, subsequent encounter for fracture with routine healing: Secondary | ICD-10-CM | POA: Diagnosis not present

## 2022-07-27 DIAGNOSIS — I5043 Acute on chronic combined systolic (congestive) and diastolic (congestive) heart failure: Secondary | ICD-10-CM | POA: Diagnosis not present

## 2022-07-27 DIAGNOSIS — I2510 Atherosclerotic heart disease of native coronary artery without angina pectoris: Secondary | ICD-10-CM | POA: Diagnosis not present

## 2022-07-28 ENCOUNTER — Encounter: Payer: Self-pay | Admitting: Student in an Organized Health Care Education/Training Program

## 2022-07-28 ENCOUNTER — Ambulatory Visit
Payer: Medicare PPO | Attending: Student in an Organized Health Care Education/Training Program | Admitting: Student in an Organized Health Care Education/Training Program

## 2022-07-28 VITALS — BP 108/56 | HR 83 | Temp 96.8°F | Ht 59.0 in | Wt 166.0 lb

## 2022-07-28 DIAGNOSIS — M170 Bilateral primary osteoarthritis of knee: Secondary | ICD-10-CM | POA: Diagnosis not present

## 2022-07-28 DIAGNOSIS — G894 Chronic pain syndrome: Secondary | ICD-10-CM

## 2022-07-28 DIAGNOSIS — M19011 Primary osteoarthritis, right shoulder: Secondary | ICD-10-CM | POA: Diagnosis not present

## 2022-07-28 DIAGNOSIS — M5416 Radiculopathy, lumbar region: Secondary | ICD-10-CM

## 2022-07-28 DIAGNOSIS — G8929 Other chronic pain: Secondary | ICD-10-CM

## 2022-07-28 DIAGNOSIS — M19012 Primary osteoarthritis, left shoulder: Secondary | ICD-10-CM | POA: Insufficient documentation

## 2022-07-28 DIAGNOSIS — M25512 Pain in left shoulder: Secondary | ICD-10-CM | POA: Insufficient documentation

## 2022-07-28 DIAGNOSIS — Z0289 Encounter for other administrative examinations: Secondary | ICD-10-CM | POA: Diagnosis present

## 2022-07-28 DIAGNOSIS — T50905A Adverse effect of unspecified drugs, medicaments and biological substances, initial encounter: Secondary | ICD-10-CM | POA: Diagnosis not present

## 2022-07-28 DIAGNOSIS — M25511 Pain in right shoulder: Secondary | ICD-10-CM | POA: Insufficient documentation

## 2022-07-28 DIAGNOSIS — M818 Other osteoporosis without current pathological fracture: Secondary | ICD-10-CM | POA: Diagnosis not present

## 2022-07-28 DIAGNOSIS — Z981 Arthrodesis status: Secondary | ICD-10-CM

## 2022-07-28 MED ORDER — TRAMADOL HCL 50 MG PO TABS: 50.0000 mg | ORAL_TABLET | Freq: Two times a day (BID) | ORAL | 1 refills | Status: DC | PRN

## 2022-07-28 NOTE — Patient Instructions (Signed)
Sign pain agreement 

## 2022-07-28 NOTE — Progress Notes (Unsigned)
   SUBJECTIVE:  No chief complaint on file.  HPI ***  PERTINENT PMH / PSH: ***  OBJECTIVE:  There were no vitals taken for this visit.   Physical Exam     05/13/2022   11:40 AM 01/06/2022    2:18 PM 09/24/2021    2:06 PM 09/02/2021   11:52 AM 06/24/2021    1:07 PM  Depression screen PHQ 2/9  Decreased Interest 0 0 0 0 0  Down, Depressed, Hopeless 0 0 0 0 0  PHQ - 2 Score 0 0 0 0 0      05/13/2022   11:40 AM 10/22/2021    2:54 PM 09/02/2021   11:54 AM 09/05/2015    3:57 PM  GAD 7 : Generalized Anxiety Score  Nervous, Anxious, on Edge 0 0 0   Control/stop worrying 0 0 0   Worry too much - different things 0 0 0   Trouble relaxing 0 0 0   Restless 0 0 0   Easily annoyed or irritable 0 0 0   Afraid - awful might happen 0 0 0   Total GAD 7 Score 0 0 0   Anxiety Difficulty Not difficult at all Not difficult at all Not difficult at all      Information is confidential and restricted. Go to Review Flowsheets to unlock data.    ASSESSMENT/PLAN:  There are no diagnoses linked to this encounter. PDMP reviewed***  No follow-ups on file.  Dana Allan, MD

## 2022-07-28 NOTE — Progress Notes (Signed)
PROVIDER NOTE: Information contained herein reflects review and annotations entered in association with encounter. Interpretation of such information and data should be left to medically-trained personnel. Information provided to patient can be located elsewhere in the medical record under "Patient Instructions". Document created using STT-dictation technology, any transcriptional errors that may result from process are unintentional.    Patient: Kerri Carter  Service Category: E/M  Provider: Edward Jolly, MD  DOB: 05-13-1940  DOS: 07/28/2022  Referring Provider: Dana Allan, MD  MRN: [id number]  Specialty: Interventional Pain Management  PCP: Dana Allan, MD  Type: Established Patient  Setting: Ambulatory outpatient    Location: Office  Delivery: Face-to-face     Primary Reason(s) for Visit: Encounter for evaluation before starting new chronic pain management plan of care (Level of risk: moderate) CC: Arm Pain (Both arms;left worse than right)  HPI  Kerri Carter is a 82 y.o. year old, female patient, who comes today for a follow-up evaluation to review the test results and decide on a treatment plan. She has COPD with acute exacerbation (HCC); OSA (obstructive sleep apnea); Shortness of breath; Valvular heart disease; Rheumatoid arthritis (HCC); Hyperlipidemia; Macular degeneration; Stage 3b chronic kidney disease (HCC); Essential hypertension; Gastroesophageal reflux disease; Adnexal mass; Osteoporosis; Prediabetes; Acquired absence of kidney; Allergic rhinitis; Leukocytosis; Peripheral neuropathy; Interstitial pulmonary disease (HCC); Chronic venous insufficiency; PAD (peripheral artery disease) (HCC); Lymphedema; Renal artery stenosis (HCC); Bipolar disorder, in full remission, most recent episode depressed (HCC); Chronic pain; Hypertriglyceridemia; Age-related osteoporosis without current pathological fracture; Coronary atherosclerosis; Drug-induced osteoporosis; Osteoarthritis of knee; Chronic pain  of both shoulders; Lumbar radiculopathy; Impaired mobility; Urinary incontinence; History of lumbar fusion; Chronic kidney disease, stage 3a (HCC); Paroxysmal atrial fibrillation (HCC); Acute on chronic diastolic CHF (congestive heart failure) (HCC); Acute on chronic respiratory failure with hypoxia and hypercapnia (HCC); Chronic heart failure with preserved ejection fraction (HCC); Iron deficiency anemia due to chronic blood loss; History of major orthopedic surgery; Sepsis due to pneumonia (HCC); Demand ischemia; NSVT (nonsustained ventricular tachycardia) (HCC); Acute urinary retention; Obesity (BMI 30-39.9); Hyperparathyroidism due to renal insufficiency (HCC); Oral candida; Fluid overload; Right rib fracture; Acute CHF (congestive heart failure) (HCC); Pain management contract signed; and Primary osteoarthritis of both shoulders on their problem list. Her primarily concern today is the Arm Pain (Both arms;left worse than right)  Pain Assessment: Location: Right, Left Arm Radiating: denies Onset: More than a month ago Duration: Chronic pain Quality: Constant, Throbbing Severity: 4 /10 (subjective, self-reported pain score)  Effect on ADL: llimits aDLS Timing: Constant Modifying factors: meds, BP: (!) 108/56  HR: 83  Kerri Carter comes in today for a follow-up visit after her initial evaluation on 03/19/2022. Today we went over the results of her tests. These were explained in "Layman's terms". During today's appointment we went over my diagnostic impression, as well as the proposed treatment plan.  Patient was recently admitted to the hospital and discharged on June 16 for shortness of breath and lower extremity swelling due to fluid overload and heart failure exacerbation in the context of her taking less torsemide than prescribed. She continues to endorse persistent and severe bilateral shoulder pain, worse with shoulder abduction.  She has had a history of bilateral rotator cuff surgery. Urine  toxicology screen is appropriate below, we will have patient initiate controlled substance agreement and take over her tramadol as below. Discussed bilateral diagnostic suprascapular nerve block and possible RFA for shoulder pain related to rotator cuff arthropathy  HPI from initial clinic visit 03/19/2022 Kerri Carter  is being evaluated for possible interventional pain management therapies for the treatment of her chronic pain.  Kerri Carter is a pleasant 82 year old female with a complex past medical history and multiple comorbidities contributing to her chronic pain syndrome.  Of note, she has lumbar spine fusion, history of right hip hemiarthroplasty, history of fracture of her left scapula, status post reverse total shoulder replacement on the right, osteoporosis, COPD, peripheral arterial disease, rheumatoid arthritis, single kidney, peripheral neuropathy and diffuse osteoarthritis.  She has tried multiple medications with limited response.  She also has multiple drug allergies.  She states that she lost her husband 3 years ago which has been really difficult for her.  She has a history of TIAs and atrial fibrillation.  She follows up with psychiatry for conversion disorder.  She has lumbar degenerative disc disease and history of falls.  She is currently on gabapentin managed by her primary care provider.  She presents today for chronic pain management hoping that we can manage her tramadol.  She takes 50 mg daily which she states takes the "edge off".  Given her kidney function and GFR of 35, would recommend dosing every 12 hours and not more frequently than that.  Avoid NSAIDs given chronic kidney disease and history of GI bleed.  Continue gabapentin as prescribed by PCP.  Avoid any stronger opioid analgesics.  Controlled Substance Pharmacotherapy Assessment REMS (Risk Evaluation and Mitigation Strategy)  Opioid Analgesic: Tramadol 50 mg twice daily as needed Pill Count: None expected due to no prior  prescriptions written by our practice. No notes on file Pharmacokinetics: Liberation and absorption (onset of action): WNL Distribution (time to peak effect): WNL Metabolism and excretion (duration of action): WNL         Pharmacodynamics: Desired effects: Analgesia: Kerri Carter reports >50% benefit. Functional ability: Patient reports that medication allows her to accomplish basic ADLs Clinically meaningful improvement in function (CMIF): Sustained CMIF goals met Perceived effectiveness: Described as relatively effective, allowing for increase in activities of daily living (ADL) Undesirable effects: Side-effects or Adverse reactions: None reported Monitoring: [place] PMP: PDMP not reviewed this encounter. Online review of the past 44-month period previously conducted. Not applicable at this point since we have not taken over the patient's medication management yet. List of other Serum/Urine Drug Screening Test(s):  No results found for: "AMPHSCRSER", "BARBSCRSER", "BENZOSCRSER", "COCAINSCRSER", "COCAINSCRNUR", "PCPSCRSER", "THCSCRSER", "THCU", "CANNABQUANT", "OPIATESCRSER", "OXYSCRSER", "PROPOXSCRSER", "ETH", "CBDTHCR", "D8THCCBX", "D9THCCBX" List of all UDS test(s) done:  Lab Results  Component Value Date   SUMMARY Note 03/19/2022   Last UDS on record: Summary  Date Value Ref Range Status  03/19/2022 Note  Final    Comment:    ==================================================================== Compliance Drug Analysis, Ur ==================================================================== Test                             Result       Flag       Units  Drug Present and Declared for Prescription Verification   Gabapentin                     PRESENT      EXPECTED   Lamotrigine                    PRESENT      EXPECTED   Methocarbamol                  PRESENT  EXPECTED   Citalopram                     PRESENT      EXPECTED   Desmethylcitalopram            PRESENT      EXPECTED     Desmethylcitalopram is an expected metabolite of citalopram or the    enantiomeric form, escitalopram.  Drug Present not Declared for Prescription Verification   Acetaminophen                  PRESENT      UNEXPECTED  Drug Absent but Declared for Prescription Verification   Quetiapine                     Not Detected UNEXPECTED   Lidocaine                      Not Detected UNEXPECTED    Lidocaine, as indicated in the declared medication list, is not    always detected even when used as directed.  ==================================================================== Test                      Result    Flag   Units      Ref Range   Creatinine              55               mg/dL      >=86 ==================================================================== Declared Medications:  The flagging and interpretation on this report are based on the  following declared medications.  Unexpected results may arise from  inaccuracies in the declared medications.   **Note: The testing scope of this panel includes these medications:   Escitalopram (Lexapro)  Gabapentin (Neurontin)  Lamotrigine (Lamictal)  Methocarbamol (Robaxin)  Quetiapine (Seroquel)   **Note: The testing scope of this panel does not include small to  moderate amounts of these reported medications:   Topical Lidocaine (Lidoderm)   **Note: The testing scope of this panel does not include the  following reported medications:   Albuterol (Ventolin HFA)  Amlodipine (Norvasc)  Apixaban (Eliquis)  Budesonide (Pulmicort)  Buspirone (Buspar)  Cetirizine (Zyrtec)  Dicyclomine (Bentyl)  Famotidine (Pepcid)  Helium  Iron  Leflunomide (Arava)  Lovastatin (Mevacor)  Lutein (Ocuvite)  Mometasone (Nasonex)  Montelukast (Singulair)  Nebivolol (Bystolic)  Oxygen  Pantoprazole (Protonix)  Prednisone (Deltasone)  Sucralfate (Carafate)  Torsemide (Demadex)  Vibegron (Gemtesa)  Vitamin  D3 ==================================================================== For clinical consultation, please call [phone]. ====================================================================    UDS interpretation: No unexpected findings.          Medication Assessment Form: Patient introduced to form today Treatment compliance: Treatment may start today if patient agrees with proposed plan. Evaluation of compliance is not applicable at this point Risk Assessment Profile: Aberrant behavior: See initial evaluations. None observed or detected today Comorbid factors increasing risk of overdose: See initial evaluation. No additional risks detected today Opioid risk tool (ORT):      No data to display          ORT Scoring interpretation table:  Score <3 = Low Risk for SUD  Score between 4-7 = Moderate Risk for SUD  Score >8 = High Risk for Opioid Abuse   Risk of substance use disorder (SUD): Low  Risk Mitigation Strategies:  Patient opioid safety counseling: Completed today. Counseling provided to patient as per "Patient Counseling  Document". Document signed by patient, attesting to counseling and understanding Patient-Prescriber Agreement (PPA): Obtained today.  Controlled substance notification to other providers: Done.  Pharmacologic Plan: Today we may be taking over the patient's pharmacological regimen. See below.             Laboratory Chemistry Profile   Renal Lab Results  Component Value Date   BUN 17 07/17/2022   CREATININE 1.16 (H) 07/17/2022   BCR 11 11/22/2020   GFR 35.66 (L) 07/03/2021   GFRAA 39 (L) 02/21/2019   GFRNONAA 48 (L) 07/17/2022   SPECGRAV 1.015 10/12/2017   PHUR 6.5 10/12/2017   PROTEINUR NEGATIVE 09/08/2021     Electrolytes Lab Results  Component Value Date   NA 139 07/17/2022   K 3.5 07/17/2022   CL 101 07/17/2022   CALCIUM 8.8 (L) 07/17/2022   MG 2.3 04/07/2022   PHOS 4.2 04/07/2022     Hepatic Lab Results  Component Value Date    AST 23 07/16/2022   ALT 16 07/16/2022   ALBUMIN 3.6 07/16/2022   ALKPHOS 81 07/16/2022   LIPASE 33 02/06/2019     ID Lab Results  Component Value Date   HIV Non Reactive 05/26/2021   SARSCOV2NAA NEGATIVE 07/16/2022   STAPHAUREUS NEGATIVE 05/26/2021   MRSAPCR NEGATIVE 05/26/2021     Bone Lab Results  Component Value Date   VD25OH 56.64 04/02/2020     Endocrine Lab Results  Component Value Date   GLUCOSE 91 07/17/2022   GLUCOSEU NEGATIVE 09/08/2021   HGBA1C 6.2 (H) 07/17/2022   TSH 2.53 07/03/2021   FREET4 1.19 04/02/2020     Neuropathy Lab Results  Component Value Date   VITAMINB12 301 07/17/2022   FOLATE >40.0 07/16/2022   HGBA1C 6.2 (H) 07/17/2022   HIV Non Reactive 05/26/2021     CNS No results found for: "COLORCSF", "APPEARCSF", "RBCCOUNTCSF", "WBCCSF", "POLYSCSF", "LYMPHSCSF", "EOSCSF", "PROTEINCSF", "GLUCCSF", "JCVIRUS", "CSFOLI", "IGGCSF", "LABACHR", "ACETBL"   Inflammation (CRP: Acute  ESR: Chronic) Lab Results  Component Value Date   CRP 0.9 07/17/2022   ESRSEDRATE 10 07/03/2013   LATICACIDVEN 1.4 03/28/2022     Rheumatology No results found for: "RF", "ANA", "LABURIC", "URICUR", "LYMEIGGIGMAB", "LYMEABIGMQN", "HLAB27"   Coagulation Lab Results  Component Value Date   INR 1.2 07/17/2022   LABPROT 15.0 07/17/2022   APTT 30 07/17/2022   PLT 208 07/17/2022   DDIMER 1.38 (H) 07/29/2021     Cardiovascular Lab Results  Component Value Date   BNP 446.2 (H) 07/16/2022   CKTOTAL 58 11/24/2013   CKMB 0.7 11/24/2013   TROPONINI <0.03 05/07/2016   HGB 11.0 (L) 07/17/2022   HCT 35.0 (L) 07/17/2022     Screening Lab Results  Component Value Date   SARSCOV2NAA NEGATIVE 07/16/2022   COVIDSOURCE NASOPHARYNGEAL 07/22/2018   STAPHAUREUS NEGATIVE 05/26/2021   MRSAPCR NEGATIVE 05/26/2021   HIV Non Reactive 05/26/2021     Cancer No results found for: "CEA", "CA125", "LABCA2"   Allergens No results found for: "ALMOND", "APPLE", "ASPARAGUS",  "AVOCADO", "BANANA", "BARLEY", "BASIL", "BAYLEAF", "GREENBEAN", "LIMABEAN", "WHITEBEAN", "BEEFIGE", "REDBEET", "BLUEBERRY", "BROCCOLI", "CABBAGE", "MELON", "CARROT", "CASEIN", "CASHEWNUT", "CAULIFLOWER", "CELERY"     Note: Lab results reviewed.  Recent Diagnostic Imaging Review  Cervical Imaging: Cervical MR wo contrast: No results found for this or any previous visit.  Cervical MR wo contrast: No valid procedures specified. Cervical CT wo contrast: Results for orders placed during the hospital encounter of 02/10/21  CT Cervical Spine Wo Contrast  Narrative CLINICAL DATA:  Neck trauma (Age >=  65y)  EXAM: CT CERVICAL SPINE WITHOUT CONTRAST  TECHNIQUE: Multidetector CT imaging of the cervical spine was performed without intravenous contrast. Multiplanar CT image reconstructions were also generated.  COMPARISON:  None.  FINDINGS: Alignment: Normal  Skull base and vertebrae: No acute fracture. No primary bone lesion or focal pathologic process.  Soft tissues and spinal canal: No prevertebral fluid or swelling. No visible canal hematoma.  Disc levels: Diffuse degenerative disc disease. Moderate to advanced bilateral degenerative facet disease, right greater than left.  Upper chest: Biapical scarring.  Other: None  IMPRESSION: Degenerative disc and facet disease.  No acute bony abnormality.   Electronically Signed By: Charlett Nose M.D. On: 02/10/2021 20:16    Narrative CLINICAL DATA:  Decreased range of motion, weakness  EXAM: MRI OF THE RIGHT SHOULDER WITHOUT CONTRAST  TECHNIQUE: Multiplanar, multisequence MR imaging of the shoulder was performed. No intravenous contrast was administered.  COMPARISON:  None.  FINDINGS: Rotator cuff: Mild tendinosis of the supraspinatus tendon with a small partial-thickness bursal surface tear. Mild tendinosis of the infraspinatus tendon. Teres minor tendon is intact. Subscapularis tendon is intact.  Muscles: No atrophy  or fatty replacement of nor abnormal signal within, the muscles of the rotator cuff.  Biceps long head:  Intact.  Acromioclavicular Joint: Mild arthropathy of the acromioclavicular joint. Type II acromion. Moderate amount of subacromial/subdeltoid bursal fluid as can be seen with bursitis.  Glenohumeral Joint: Large joint effusion with synovitis. Partial-thickness cartilage loss of the glenohumeral joint.  Labrum:  Intact.  Bones: Subchondral serpiginous signal abnormality in the humeral head with surrounding marrow edema most consistent with avascular necrosis without articular surface collapse.  Other: No fluid collection or hematoma.  IMPRESSION: 1. Avascular necrosis of right humeral head with surrounding marrow edema. No articular surface collapse. 2. Large glenohumeral joint effusion with synovitis. 3. Mild osteoarthritis of the glenohumeral joint. 4. Mild tendinosis of the supraspinatus tendon with a small partial-thickness bursal surface tear. 5. Mild tendinosis of the infraspinatus tendon. 6. Subacromial/subdeltoid bursitis.   Electronically Signed By: Elige Ko On: 06/30/2017 14:38   Narrative CLINICAL DATA:  Left shoulder pain for several months. Limited range of motion.  EXAM: MRI OF THE LEFT SHOULDER WITHOUT CONTRAST  TECHNIQUE: Multiplanar, multisequence MR imaging of the shoulder was performed. No intravenous contrast was administered.  COMPARISON:  None.  FINDINGS: Examination is limited by motion artifact.  Rotator cuff: Large full-thickness retracted rotator cuff tear. The infraspinatus tendon is torn and retracted approximately 3 cm. Most of the supraspinatus tendon is also torn and retracted a maximum of 10 mm. The subscapularis tendon appears intact. Moderate tendinopathy.  Muscles: Marked edema like signal abnormality and fluid tracking back along and around the infraspinatus muscle. No fatty atrophy of the supraspinatus  muscle.  Biceps long head: Difficult to identify the entire intra-articular portion of the long head biceps tendon for certain but I think it is intact.  Acromioclavicular Joint: No degenerative changes. Type 1-2 acromion. No lateral downsloping or undersurface spurring.  Glenohumeral Joint: Moderate degenerative changes with small joint effusion and mild synovitis. The humeral head is riding high in the glenoid fossa.  Labrum:  No obvious labral tears.  Bones: Moderate-sized focus of AVN involving the humeral head. This is likely chronic as there is no surrounding marrow edema. There is fluid surrounding part of the lesions suggesting it is loose or potentially loose.  Other: Expected fluid in the subacromial/subdeltoid bursa.  IMPRESSION: 1. Large full-thickness retracted rotator cuff tear as detailed above.  There also significant edema/inflammation/fluid tracking back along the infraspinatus muscle. 2. Intact long head biceps tendon and grossly normal glenoid labrum. 3. Moderate-sized focus of AVN involving the humeral head. 4. Glenohumeral joint degenerative changes with small joint effusion and mild synovitis.   Electronically Signed By: Rudie Meyer M.D. On: 01/05/2018 17:11   Narrative CLINICAL DATA:  Right arm pain after fall.  EXAM: RIGHT SHOULDER - 2+ VIEW  COMPARISON:  None Available.  FINDINGS: Status post right total shoulder arthroplasty. Glenoid and humeral components are well situated. Old right rib fractures are noted. No acute fracture or dislocation is noted.  IMPRESSION: No acute abnormality is noted.   Electronically Signed By: Lupita Raider M.D. On: 07/17/2021 14:19  Shoulder-L DG: Results for orders placed during the hospital encounter of 07/28/21  DG Shoulder Left  Narrative CLINICAL DATA:  Left shoulder hump. Multiple prior left shoulder surgeries.  EXAM: LEFT SHOULDER - 2+ VIEW; LEFT HUMERUS - 2+ VIEW  COMPARISON:  Left  shoulder x-rays dated December 21, 2019.  FINDINGS: Prior reverse total shoulder arthroplasty. No evidence of hardware failure or loosening. Prior ORIF of the acromion with displacement of the distal acromion fragment from the hardware, new since postoperative x-rays from November 2021. Hardware approximates the skin surface. Heterotopic ossification posterior to the arthroplasty. The mid to distal humerus and elbow are unremarkable.  IMPRESSION: 1. Failed ORIF of the acromion with displacement of the distal acromion fragment from the hardware, new since postoperative x-rays from December 21, 2019. 2. Prior reverse total shoulder arthroplasty without hardware complication.   Electronically Signed By: Obie Dredge M.D. On: 07/31/2021 11:09    MR LUMBAR SPINE WO CONTRAST  Narrative CLINICAL DATA:  Chronic low back pain  EXAM: MRI LUMBAR SPINE WITHOUT CONTRAST  TECHNIQUE: Multiplanar, multisequence MR imaging of the lumbar spine was performed. No intravenous contrast was administered.  COMPARISON:  07/26/2020  FINDINGS: Segmentation:  Standard.  Alignment: Minimal grade 1 anterolisthesis of L4 on L5. 2 mm retrolisthesis of L3 on L4.  Vertebrae: No acute fracture, evidence of discitis, or aggressive bone lesion.  Conus medullaris and cauda equina: Conus extends to the T11-12 level. Conus and cauda equina appear normal.  Paraspinal and other soft tissues: No acute paraspinal abnormality.  Disc levels:  Disc spaces: Degenerative disease with severe disc height loss at L2-3, L3-4 and L5-S1. Posterior lumbar interbody fusion at L4-5.  T12-L1: Minimal broad-based disc bulge. No foraminal or central canal stenosis.  L1-L2: Mild broad-based disc bulge. No foraminal or central canal stenosis.  L2-L3: Left laminectomy. Broad-based disc bulge with a left paracentral disc protrusion with mass effect on the left intraspinal L3 nerve roots. Mild left foraminal  stenosis. Mild spinal stenosis. Mild bilateral facet arthropathy.  L3-L4: Broad-based disc bulge with a large broad central disc protrusion and a large right paracentral disc extrusion with caudal migration of disc material resulting in mass effect upon the right intraspinal L4 nerve roots. Mild bilateral facet arthropathy. Severe multifactorial spinal stenosis. Moderate bilateral foraminal stenosis.  L4-L5: Posterior lumbar interbody fusion with a broad disc bulge. No foraminal or central canal stenosis.  L5-S1: Broad-based disc bulge. Moderate bilateral facet arthropathy. Moderate bilateral foraminal stenosis.  IMPRESSION: 1. At L2-3 there is a left laminectomy. Broad-based disc bulge with a left paracentral disc protrusion with mass effect on the left intraspinal L3 nerve roots. Mild left foraminal stenosis. Mild spinal stenosis. Mild bilateral facet arthropathy. 2. At L3-4 there is a broad-based disc bulge with a  large broad central disc protrusion and a large right paracentral disc extrusion with caudal migration of disc material resulting in mass effect upon the right intraspinal L4 nerve roots. Severe multifactorial spinal stenosis. Moderate bilateral foraminal stenosis. 3. Posterior lumbar interbody fusion at L4-5 with a broad disc bulge. No foraminal or central canal stenosis. 4. At L5-S1 there is a broad-based disc bulge. Moderate bilateral facet arthropathy. Moderate bilateral foraminal stenosis.   Electronically Signed By: Elige Ko M.D. On: 04/04/2021 15:41   CT Lumbar Spine Wo Contrast  Narrative CLINICAL DATA:  Low back pain,  mechanical fall  EXAM: CT LUMBAR SPINE WITHOUT CONTRAST  TECHNIQUE: Multidetector CT imaging of the lumbar spine was performed without intravenous contrast administration. Multiplanar CT image reconstructions were also generated.  COMPARISON:  07/26/2020  FINDINGS: Segmentation: The lowest lumbar type non-rib-bearing  vertebra is labeled as L5.  Alignment: 3 mm fused anterolisthesis at L4-5, unchanged. There is 2 mm of degenerative retrolisthesis at L2-3 and L3-4, unchanged.  Vertebrae: Posterolateral rod and pedicle screw fixation at L4-5 bilaterally with interbody spacer and interbody graft material. No abnormal lucency along the pedicle screws. Stable appearance of mild scalloping of the superior endplate of L4. Substantial facet arthropathy at L4-5 and L5-S1 bilaterally. Unfused ossicle versus old nonunited fracture the tip of the spinous process of L4, unchanged from prior. No acute lumbar spine fracture is observed.  Paraspinal and other soft tissues: Atherosclerosis is present, including aortoiliac atherosclerotic disease. Absent right kidney.  Disc levels: The findings at individual intervertebral levels are similar to 6/20 4/22.  L1-2: No impingement.  Mild disc bulge.  L2-3: Possible bilateral subarticular lateral recess stenosis and likely moderate central narrowing of the thecal sac due to diffuse disc bulge and facet arthropathy.  L3-4: Mild to moderate central narrowing of the thecal sac and mild left foraminal stenosis due to disc bulge, facet arthropathy, and short pedicles.  L4-5: Mild bilateral foraminal stenosis due to disc uncovering and facet arthropathy. Prior left laminectomy.  L5-S1: Mild bilateral foraminal stenosis due to intervertebral and facet spurring.  IMPRESSION: 1. No acute fracture or acute abnormality observed. 2. Lumbar spondylosis and degenerative disc disease causing moderate impingement at L2-3, mild to moderate impingement at L3-4, and mild impingement at L4-5 and L5-S1, as noted above. 3. Prior L4-5 fusion. 4.  Aortic Atherosclerosis (ICD10-I70.0).   Electronically Signed By: Gaylyn Rong M.D. On: 11/20/2020 14:11    Narrative CLINICAL DATA:  Right hip pain after fall last Thursday. Unable to ambulate.  EXAM: DG HIP (WITH OR  WITHOUT PELVIS) 2-3V RIGHT  COMPARISON:  CT abdomen pelvis dated February 10, 2021.  FINDINGS: No acute fracture or dislocation. Bilateral hip arthroplasties and lumbar fusion. No evidence of hardware failure or loosening. Soft tissues are unremarkable.  IMPRESSION: 1. No acute osseous abnormality.   Electronically Signed By: Obie Dredge M.D. On: 09/08/2021 08:42   Narrative CLINICAL DATA:  Pt comes into the ED via EMS from home, pt c/o left hip pain, had recent fall 2 days ago and was seen here 10/19 for same. Pain secondary to a fall 2 days ago.  EXAM: DG HIP (WITH OR WITHOUT PELVIS) 2-3V LEFT  COMPARISON:  10/29/2020  FINDINGS: Bilateral hip prosthetics. No fracture dislocation within the pelvis or femurs. Posterior lumbar fusion. Dedicated views of the LEFT hip demonstrate no fracture or dislocation.  IMPRESSION: No acute osseous abnormality.   Electronically Signed By: Genevive Bi M.D. On: 11/22/2020 08:58   Narrative CLINICAL DATA:  Fall  EXAM: LEFT KNEE - COMPLETE 4+ VIEW  COMPARISON:  None.  FINDINGS: Negative for fracture or joint effusion. Mild marginal spurring medially. No significant joint space narrowing.  IMPRESSION: Negative for fracture.  Mild degenerative change medially   Electronically Signed By: Marlan Palau M.D. On: 11/20/2020 16:31    Narrative CLINICAL DATA:  Right foot pain after fall last Thursday. Unable to ambulate.  EXAM: RIGHT FOOT COMPLETE - 3+ VIEW  COMPARISON:  Right ankle x-rays dated January 03, 2015.  FINDINGS: Acute minimally impacted fracture at the base of the fifth proximal phalanx. No additional fracture. No dislocation. Postsurgical changes of the first and third metatarsals. Mild degenerative changes of the forefoot. Osteopenia. Dorsal forefoot soft tissue swelling.  IMPRESSION: 1. Acute minimally impacted fracture at the base of the fifth proximal phalanx.   Electronically  Signed By: Obie Dredge M.D. On: 09/08/2021 08:46  Narrative CLINICAL DATA:  Trip and fall with left foot pain, initial encounter  EXAM: LEFT FOOT - COMPLETE 3+ VIEW  COMPARISON:  None.  FINDINGS: There is no evidence of fracture or dislocation. There is no evidence of arthropathy or other focal bone abnormality. Soft tissues are unremarkable.  IMPRESSION: No acute abnormality noted.   Electronically Signed By: Alcide Clever M.D. On: 12/24/2015 16:58    Complexity Note: Imaging results reviewed.                         Meds   Current Outpatient Medications:    albuterol (PROVENTIL) (2.5 MG/3ML) 0.083% nebulizer solution, Take by nebulization., Disp: , Rfl:    albuterol (VENTOLIN HFA) 108 (90 Base) MCG/ACT inhaler, Inhale 2 puffs into the lungs every 6 (six) hours as needed for wheezing or shortness of breath., Disp: 18 g, Rfl: 11   amLODipine (NORVASC) 5 MG tablet, Take 1 tablet (5 mg total) by mouth daily., Disp: 90 tablet, Rfl: 3   apixaban (ELIQUIS) 5 MG TABS tablet, Take 1 tablet (5 mg total) by mouth 2 (two) times daily., Disp: 180 tablet, Rfl: 1   busPIRone (BUSPAR) 5 MG tablet, Take 1 tablet (5 mg total) by mouth 2 (two) times daily., Disp: , Rfl:    cetirizine (ZYRTEC) 10 MG tablet, TAKE 1 TABLET BY MOUTH EVERYDAY AT BEDTIME, Disp: 90 tablet, Rfl: 3   dicyclomine (BENTYL) 10 MG capsule, Take 1 capsule (10 mg total) by mouth 3 (three) times daily before meals., Disp: 90 capsule, Rfl: 0   ENBREL SURECLICK 50 MG/ML injection, Inject 50 mg into the skin once a week. Takes on Fridays, Disp: , Rfl:    escitalopram (LEXAPRO) 10 MG tablet, Take 1 tablet (10 mg total) by mouth daily., Disp: 90 tablet, Rfl: 3   Fluticasone-Umeclidin-Vilant (TRELEGY ELLIPTA) 100-62.5-25 MCG/ACT AEPB, Inhale 1 puff into the lungs daily., Disp: 60 each, Rfl: 6   gabapentin (NEURONTIN) 300 MG capsule, Take 300 mg in the morning and 600 mg at night., Disp: , Rfl:    ipratropium-albuterol  (DUONEB) 0.5-2.5 (3) MG/3ML SOLN, SMARTSIG:3 Milliliter(s) Via Nebulizer Every 6 Hours PRN, Disp: , Rfl:    lamoTRIgine (LAMICTAL) 100 MG tablet, TAKE 1 TAB 2 TIMES DAILY. FURTHER REFILLS NEW PSYCH FOR ALL PSYCH MEDS ONLY TEMP SUPPLY FROM PCP, Disp: 180 tablet, Rfl: 1   leflunomide (ARAVA) 20 MG tablet, Take 1 tablet (20 mg total) by mouth daily., Disp: , Rfl:    lidocaine (LIDODERM) 5 %, Place 1 patch onto the skin 2 (two) times daily  as needed. Remove & Discard patch within 12 hours or as directed by MD, Disp: 120 patch, Rfl: 2   lovastatin (MEVACOR) 20 MG tablet, TAKE 1 TABLET BY MOUTH EVERYDAY AT BEDTIME *STOP TALKING 40MG *, Disp: 90 tablet, Rfl: 2   methocarbamol (ROBAXIN) 500 MG tablet, Take 500 mg by mouth 4 (four) times daily as needed for muscle spasms. Taking twice a day currently, Disp: , Rfl:    mometasone (NASONEX) 50 MCG/ACT nasal spray, 1 spray to each nostril twice a day, Disp: 1 each, Rfl: 3   montelukast (SINGULAIR) 10 MG tablet, TAKE 1 TABLET BY MOUTH EVERY DAY, Disp: 90 tablet, Rfl: 1   montelukast (SINGULAIR) 10 MG tablet, Take 1 tablet (10 mg total) by mouth daily., Disp: 30 tablet, Rfl: 2   multivitamin-lutein (OCUVITE-LUTEIN) CAPS capsule, Take 1 capsule by mouth at bedtime., Disp: , Rfl:    nebivolol (BYSTOLIC) 5 MG tablet, Take 1 tablet (5 mg total) by mouth in the morning and at bedtime. (Note dose changed from 1/2 10 mg bid to 5 mg bid), Disp: 180 tablet, Rfl: 3   OXYGEN, Inhale 3 L into the lungs continuous., Disp: , Rfl:    pantoprazole (PROTONIX) 40 MG tablet, TAKE 1 TABLET BY MOUTH 2 TIMES DAILY 30 MIN BEFORE FOOD (NOTE REDUCTION IN FREQUENCY), Disp: 180 tablet, Rfl: 1   predniSONE (DELTASONE) 5 MG tablet, TAKE 1 TABLET BY MOUTH EVERY DAY WITH BREAKFAST, Disp: 30 tablet, Rfl: 3   PROLIA 60 MG/ML SOSY injection, Inject 60 mg into the skin every 6 (six) months., Disp: , Rfl:    QUEtiapine (SEROQUEL) 25 MG tablet, TAKE 1 TABLET (25 MG TOTAL) BY MOUTH AT BEDTIME. AGAIN LAST  FILL FURTHER REFILLS FROM PSYCHIATRY NO EXCEPTIONS, Disp: 90 tablet, Rfl: 0   sucralfate (CARAFATE) 1 g tablet, TAKE 1 TABLET BY MOUTH 2 TIMES DAILY., Disp: 180 tablet, Rfl: 2   torsemide (DEMADEX) 20 MG tablet, Take 2 tablets (40 mg total) by mouth daily., Disp: 60 tablet, Rfl: 0   Vibegron (GEMTESA) 75 MG TABS, Take 75 mg by mouth daily., Disp: 90 tablet, Rfl: 3   traMADol (ULTRAM) 50 MG tablet, Take 1 tablet (50 mg total) by mouth every 12 (twelve) hours as needed., Disp: 60 tablet, Rfl: 1  ROS  Constitutional: Denies any fever or chills Gastrointestinal: No reported hemesis, hematochezia, vomiting, or acute GI distress Musculoskeletal: Denies any acute onset joint swelling, redness, loss of ROM, or weakness Neurological: No reported episodes of acute onset apraxia, aphasia, dysarthria, agnosia, amnesia, paralysis, loss of coordination, or loss of consciousness  Allergies  Kerri Carter is allergic to ceftin [cefuroxime axetil]; lisinopril; sulfa antibiotics; sulfasalazine; morphine; xarelto [rivaroxaban]; adhesive [tape]; antihistamines, chlorpheniramine-type; antivert [meclizine hcl]; aspirin; contrast media [iodinated contrast media]; decongestant [pseudoephedrine hcl]; doxycycline; levaquin [levofloxacin in d5w]; polymyxin b; and tetanus toxoids.  PFSH  Drug: Kerri Carter  reports no history of drug use. Alcohol:  reports no history of alcohol use. Tobacco:  reports that she quit smoking about 48 years ago. Her smoking use included cigarettes. She has a 10.00 pack-year smoking history. She has never used smokeless tobacco. Medical:  has a past medical history of Acromial process of scapula fracture (12/21/2019), Acute kidney injury superimposed on chronic kidney disease (HCC) (05/26/2021), Acute on chronic combined systolic (congestive) and diastolic (congestive) heart failure (HCC) (07/28/2021), Acute on chronic combined systolic and diastolic CHF (congestive heart failure) (HCC) (05/26/2021),  Acute on chronic respiratory failure with hypoxia (HCC) (09/10/2021), Acute pain of left knee (  12/18/2020), Anemia in stage 3a chronic kidney disease (HCC) (10/09/2021), Anxiety, Anxiety (01/20/2019), Anxiety and depression (05/26/2021), Aortic stenosis (07/09/2015), Arrhythmia, Assistance needed for mobility (12/18/2020), Asthma, At high risk for falls (03/16/2019), Avascular necrosis of left shoulder due to adverse effect of steroid therapy (HCC) (01/20/2018), Bipolar disorder (HCC), C. difficile diarrhea (2010), Carotid atherosclerosis, bilateral, Cataract, CHF (congestive heart failure) (HCC), Chicken pox, Chronic left shoulder pain (08/29/2019), Chronic respiratory failure with hypoxia (HCC) (10/01/2021), CKD (chronic kidney disease), stage III (HCC), Closed fracture of acromial process of scapula (10/28/2018), Closed nondisplaced fracture of proximal phalanx of lesser toe of right foot, Collapsed vertebra, not elsewhere classified, lumbar region, initial encounter for fracture (HCC) (02/21/2019), Community acquired pneumonia (07/22/2021), Complicated grief (03/16/2019), Compression fracture of L4 vertebra (HCC) (02/21/2019), Conversion disorder, Conversion disorder (08/04/2015), COPD (chronic obstructive pulmonary disease) (HCC), COPD with exacerbation (HCC) (08/20/2021), Cough (05/02/2012), Depression, Dislocation of hip joint prosthesis (HCC) (01/08/2017), Elevated brain natriuretic peptide (BNP) level (12/18/2020), Elevated d-dimer (05/26/2021), Episodic mood disorder (HCC) (04/18/2019), Essential hypertension, Fatigue (07/07/2018), Frequent falls (12/18/2020), GERD (gastroesophageal reflux disease), H/O cardiac catheterization (02/24/2011), Hav (hallux abducto valgus), unspecified laterality (12/05/2018), Heart murmur, Hip fracture (HCC) (10/31/2016), Hip pain (12/18/2020), History of fracture of left hip (01/15/2017), History of syncope (08/25/2016), Hyperkalemia (05/26/2021), Hyperlipidemia, Hypokalemia  (08/25/2016), ILD (interstitial lung disease) (HCC), Iliopsoas bursitis of right hip (08/29/2019), Inflammatory arthritis, Insomnia (07/17/2015), Left foot pain (10/19/2016), Left lower lobe pneumonia (05/26/2021), Leg edema (05/02/2019), Leg pain, bilateral, Leg swelling (12/15/2016), Leukocytosis (10/19/2017), Leukopenia (06/24/2021), Lymphocytosis (07/13/2021), Macular degeneration, Microcytic anemia (08/15/2015), Mixed bipolar I disorder (HCC) (10/09/2015), Nocturnal hypoxemia, Nocturnal hypoxemia (09/09/2011), Non-Obstructive CAD, NSIP (nonspecific interstitial pneumonitis) (HCC) (12/08/2019), Osteoarthritis, Other shoulder lesions, left shoulder (12/20/2017), Other specified postprocedural states (07/20/2013), Overweight (BMI 25.0-29.9) (08/29/2019), PAD (peripheral artery disease) (HCC), Pain due to onychomycosis of toenails of both feet (12/05/2018), Pelvic mass in female (03/16/2019), Physical deconditioning (02/27/2021), Pleural effusion on left (12/18/2020), PUD (peptic ulcer disease), Recurrent falls (09/01/2021), Recurrent pneumonia (07/23/2021), Repeated falls (12/18/2020), Respiratory failure with hypoxia (HCC) (01/12/2021), Rotator cuff tendinitis, right (07/26/2017), S/P lumbar fusion (05/23/2021), S/P right hip fracture, Sepsis due to pneumonia (HCC) (07/22/2021), Shoulder pain, Sleep apnea, SOB (shortness of breath) (10/13/2011), Spinal stenosis at L4-L5 level, Spinal stenosis of lumbar region (09/15/2016), Stage 3a chronic kidney disease (CKD) (HCC) (07/23/2021), Status post hip hemiarthroplasty (01/08/2017), Status post lumbar spinal fusion (01/15/2017), Status post reverse arthroplasty of shoulder, left (07/26/2018), Status post reverse total shoulder replacement, right (11/04/2017), Strain of right hip (02/26/2017), Thrombocytosis (06/24/2021), Toe fracture, right (09/09/2021), Toxic maculopathy, 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 Surgical: Kerri Carter  has a past surgical history that includes Nephrectomy (1988); Tubal ligation; Appendectomy; Tonsillectomy; Cesarean section; Bunionectomy (Right); Total hip arthroplasty (12/10/11); Colonoscopy with propofol (N/A, 04/02/2016); Esophagogastroduodenoscopy (egd) with propofol (N/A, 04/02/2016); Esophageal dilation (04/02/2016); Endoscopic retrograde cholangiopancreatography (ercp) with propofol (N/A, 05/08/2016); Cholecystectomy (N/A, 05/11/2016); Hip Arthroplasty (Right, 11/01/2016); Replacement total knee (Right); Total hip arthroplasty (Bilateral); Cataract extraction, bilateral; osteoporosis; ERCP; Reverse shoulder arthroplasty (Right, 11/04/2017); Reverse shoulder arthroplasty (Left, 07/26/2018); Back surgery; ORIF scaphoid fracture (Left, 12/21/2019); Lumbar laminectomy/decompression microdiscectomy (N/A, 12/11/2020); Transforaminal lumbar interbody fusion (tlif) with pedicle screw fixation 3 level (N/A, 05/23/2021); and Application of intraoperative CT scan (N/A, 05/23/2021). Family: family history includes Alcohol abuse in her brother; Asthma in her mother and sister; Breast cancer in her sister; Cancer in her maternal grandmother; Colon cancer in her maternal grandmother; Dementia in her son; Depression in her  brother; Diabetes in her maternal grandmother and son; Gout in her son; Heart attack in her father and sister; Heart disease in her brother, brother, father, maternal grandmother, mother, sister, sister, and sister; Hypertension in her father and mother; Lung cancer in her sister; Parkinson's disease in her mother; Peripheral Artery Disease in her father; Rheum arthritis in her mother; Stroke in her mother.  Constitutional Exam  General appearance: Well nourished, well developed, and well hydrated. In no apparent acute distress Vitals:   07/28/22 1339  BP: (!) 108/56  Pulse: 83  Temp: (!) 96.8 F (36 C)  TempSrc: Temporal  SpO2: 97%   Weight: 166 lb (75.3 kg)  Height: 4\' 11"  (1.499 m)   BMI Assessment: Estimated body mass index is 33.53 kg/m as calculated from the following:   Height as of this encounter: 4\' 11"  (1.499 m).   Weight as of this encounter: 166 lb (75.3 kg).  BMI interpretation table: BMI level Category Range association with higher incidence of chronic pain  <18 kg/m2 Underweight   18.5-24.9 kg/m2 Ideal body weight   25-29.9 kg/m2 Overweight Increased incidence by 20%  30-34.9 kg/m2 Obese (Class I) Increased incidence by 68%  35-39.9 kg/m2 Severe obesity (Class II) Increased incidence by 136%  >40 kg/m2 Extreme obesity (Class III) Increased incidence by 254%   Patient's current BMI Ideal Body weight  Body mass index is 33.53 kg/m. Patient must be at least 60 in tall to calculate ideal body weight   BMI Readings from Last 4 Encounters:  07/28/22 33.53 kg/m  07/19/22 34.19 kg/m  06/04/22 34.16 kg/m  06/03/22 34.03 kg/m   Wt Readings from Last 4 Encounters:  07/28/22 166 lb (75.3 kg)  07/19/22 172 lb 2.9 oz (78.1 kg)  06/04/22 172 lb (78 kg)  06/03/22 171 lb 6 oz (77.7 kg)    Psych/Mental status: Alert, oriented x 3 (person, place, & time)       Eyes: PERLA Respiratory: Oxygen-dependent COPD  Cervical Spine Area Exam  Skin & Axial Inspection: No masses, redness, edema, swelling, or associated skin lesions Alignment: Symmetrical Functional ROM: Pain restricted ROM      Stability: No instability detected Muscle Tone/Strength: Functionally intact. No obvious neuro-muscular anomalies detected. Sensory (Neurological): Musculoskeletal pain pattern Palpation: No palpable anomalies             Upper Extremity (UE) Exam      Side: Right upper extremity   Side: Left upper extremity  Skin & Extremity Inspection: Evidence of prior arthroplastic surgery   Skin & Extremity Inspection: Evidence of prior arthroplastic surgery  Functional ROM: Decreased ROM for shoulder   Functional ROM: Decreased  ROM for shoulder  Muscle Tone/Strength: Functionally intact. No obvious neuro-muscular anomalies detected.   Muscle Tone/Strength: Functionally intact. No obvious neuro-muscular anomalies detected.  Sensory (Neurological): Arthropathic arthralgia           Sensory (Neurological): Arthropathic arthralgia          Palpation: No palpable anomalies               Palpation: No palpable anomalies              Provocative Test(s):  Phalen's test: deferred Tinel's test: deferred Apley's scratch test (touch opposite shoulder):  Action 1 (Across chest): Decreased ROM Action 2 (Overhead): Decreased ROM Action 3 (LB reach): Decreased ROM     Provocative Test(s):  Phalen's test: deferred Tinel's test: deferred Apley's scratch test (touch opposite shoulder):  Action 1 (Across chest): Decreased  ROM Action 2 (Overhead): Decreased ROM Action 3 (LB reach): Decreased ROM      Lumbar Spine Area Exam  Skin & Axial Inspection: Well healed scar from previous spine surgery detected Alignment: Scoliosis detected Functional ROM: Pain restricted ROM       Stability: No instability detected Muscle Tone/Strength: Functionally intact. No obvious neuro-muscular anomalies detected. Sensory (Neurological): Neurogenic pain pattern Palpation: No palpable anomalies         Lower Extremity Exam      Side: Right lower extremity   Side: Left lower extremity  Stability: No instability observed           Stability: No instability observed          Skin & Extremity Inspection: Skin color, temperature, and hair growth are WNL. No peripheral edema or cyanosis. No masses, redness, swelling, asymmetry, or associated skin lesions. No contractures.   Skin & Extremity Inspection: Skin color, temperature, and hair growth are WNL. No peripheral edema or cyanosis. No masses, redness, swelling, asymmetry, or associated skin lesions. No contractures.  Functional ROM: Pain restricted ROM for all joints of the lower extremity            Functional ROM: Pain restricted ROM for all joints of the lower extremity          Muscle Tone/Strength: Functionally intact. No obvious neuro-muscular anomalies detected.   Muscle Tone/Strength: Functionally intact. No obvious neuro-muscular anomalies detected.  Sensory (Neurological): Arthropathic arthralgia         Sensory (Neurological): Arthropathic arthralgia        DTR: Patellar: deferred today Achilles: deferred today Plantar: deferred today   DTR: Patellar: deferred today Achilles: deferred today Plantar: deferred today  Palpation: No palpable anomalies   Palpation: No palpable anomalies     Assessment & Plan  Primary Diagnosis & Pertinent Problem List: The primary encounter diagnosis was Chronic pain syndrome. Diagnoses of Chronic pain of both shoulders, History of lumbar fusion, Drug-induced osteoporosis, Lumbar radiculopathy, Primary osteoarthritis of both knees, Primary osteoarthritis of both shoulders, and Pain management contract signed were also pertinent to this visit.  Visit Diagnosis: 1. Chronic pain syndrome   2. Chronic pain of both shoulders   3. History of lumbar fusion   4. Drug-induced osteoporosis   5. Lumbar radiculopathy   6. Primary osteoarthritis of both knees   7. Primary osteoarthritis of both shoulders   8. Pain management contract signed    Problems updated and reviewed during this visit: Problem  Pain Management Contract Signed  Primary Osteoarthritis of Both Shoulders    Plan of Care  Pharmacotherapy (Medications Ordered): Meds ordered this encounter  Medications   traMADol (ULTRAM) 50 MG tablet    Sig: Take 1 tablet (50 mg total) by mouth every 12 (twelve) hours as needed.    Dispense:  60 tablet    Refill:  1   Procedure Orders         SUPRASCAPULAR NERVE BLOCK        Provider-requested follow-up: Return in about 15 days (around 08/12/2022) for B/L SSNB, in clinic NS. Recent Visits No visits were found meeting these  conditions. Showing recent visits within past 90 days and meeting all other requirements Today's Visits Date Type Provider Dept  07/28/22 Office Visit Edward Jolly, MD [facility]  Showing today's visits and meeting all other requirements Future Appointments No visits were found meeting these conditions. Showing future appointments within next 90 days and meeting all other requirements  Primary Care Physician: Dana Allan, MD  Duration of encounter: .  Total time on encounter, as per AMA guidelines included both the face-to-face and non-face-to-face time personally spent by the physician and/or other qualified health care professional(s) on the day of the encounter (includes time in activities that require the physician or other qualified health care professional and does not include time in activities normally performed by clinical staff). Physician's time may include the following activities when performed: Preparing to see the patient (e.g., pre-charting review of records, searching for previously ordered imaging, lab work, and nerve conduction tests) Review of prior analgesic pharmacotherapies. Reviewing PMP Interpreting ordered tests (e.g., lab work, imaging, nerve conduction tests) Performing post-procedure evaluations, including interpretation of diagnostic procedures Obtaining and/or reviewing separately obtained history Performing a medically appropriate examination and/or evaluation Counseling and educating the patient/family/caregiver Ordering medications, tests, or procedures Referring and communicating with other health care professionals (when not separately reported) Documenting clinical information in the electronic or other health record Independently interpreting results (not separately reported) and communicating results to the patient/ family/caregiver Care coordination (not separately reported)  Note by: Edward Jolly, MD (TTS technology used. I  apologize for any typographical errors that were not detected and corrected.) Date: 07/28/2022; Time: 2:45 PM

## 2022-07-28 NOTE — Patient Instructions (Incomplete)
It was a pleasure meeting you today. Thank you for allowing me to take part in your health care.  Our goals for today as we discussed include:  Will order ultrasound to check for stones Take ibuprofen 600 mg every 8 hours for pain as needed  Will get some labs today and notify your results.  4 weeks If you have any questions or concerns, please do not hesitate to call the office at [phone].  I look forward to our next visit and until then take care and stay safe.  Regards,   Ottis Sarnowski, MD   [facility]  

## 2022-07-29 ENCOUNTER — Ambulatory Visit: Payer: Medicare PPO | Admitting: Family Medicine

## 2022-07-29 ENCOUNTER — Encounter: Payer: Self-pay | Admitting: Family Medicine

## 2022-07-29 DIAGNOSIS — I5032 Chronic diastolic (congestive) heart failure: Secondary | ICD-10-CM

## 2022-07-29 DIAGNOSIS — R195 Other fecal abnormalities: Secondary | ICD-10-CM | POA: Diagnosis not present

## 2022-07-29 DIAGNOSIS — I10 Essential (primary) hypertension: Secondary | ICD-10-CM | POA: Diagnosis not present

## 2022-07-29 DIAGNOSIS — K219 Gastro-esophageal reflux disease without esophagitis: Secondary | ICD-10-CM

## 2022-07-29 MED ORDER — PANTOPRAZOLE SODIUM 40 MG PO TBEC: DELAYED_RELEASE_TABLET | ORAL | 1 refills | Status: DC

## 2022-07-30 DIAGNOSIS — I480 Paroxysmal atrial fibrillation: Secondary | ICD-10-CM | POA: Diagnosis not present

## 2022-07-30 DIAGNOSIS — D631 Anemia in chronic kidney disease: Secondary | ICD-10-CM | POA: Diagnosis not present

## 2022-07-30 DIAGNOSIS — N1831 Chronic kidney disease, stage 3a: Secondary | ICD-10-CM | POA: Diagnosis not present

## 2022-07-30 DIAGNOSIS — I2510 Atherosclerotic heart disease of native coronary artery without angina pectoris: Secondary | ICD-10-CM | POA: Diagnosis not present

## 2022-07-30 DIAGNOSIS — M800AXD Age-related osteoporosis with current pathological fracture, other site, subsequent encounter for fracture with routine healing: Secondary | ICD-10-CM | POA: Diagnosis not present

## 2022-07-30 DIAGNOSIS — F4323 Adjustment disorder with mixed anxiety and depressed mood: Secondary | ICD-10-CM | POA: Diagnosis not present

## 2022-07-30 DIAGNOSIS — I5043 Acute on chronic combined systolic (congestive) and diastolic (congestive) heart failure: Secondary | ICD-10-CM | POA: Diagnosis not present

## 2022-07-30 DIAGNOSIS — I130 Hypertensive heart and chronic kidney disease with heart failure and stage 1 through stage 4 chronic kidney disease, or unspecified chronic kidney disease: Secondary | ICD-10-CM | POA: Diagnosis not present

## 2022-07-30 DIAGNOSIS — F3160 Bipolar disorder, current episode mixed, unspecified: Secondary | ICD-10-CM | POA: Diagnosis not present

## 2022-08-03 NOTE — Telephone Encounter (Signed)
Pt called stating the OT and PT need to be resent to [facility] because she can not go out to the hospital and pay a copay

## 2022-08-04 ENCOUNTER — Other Ambulatory Visit: Payer: Self-pay | Admitting: Family Medicine

## 2022-08-04 ENCOUNTER — Telehealth: Payer: Self-pay | Admitting: Family Medicine

## 2022-08-04 DIAGNOSIS — J4489 Other specified chronic obstructive pulmonary disease: Secondary | ICD-10-CM

## 2022-08-04 DIAGNOSIS — D75839 Thrombocytosis, unspecified: Secondary | ICD-10-CM

## 2022-08-04 DIAGNOSIS — D631 Anemia in chronic kidney disease: Secondary | ICD-10-CM | POA: Diagnosis not present

## 2022-08-04 DIAGNOSIS — M48061 Spinal stenosis, lumbar region without neurogenic claudication: Secondary | ICD-10-CM

## 2022-08-04 DIAGNOSIS — Z905 Acquired absence of kidney: Secondary | ICD-10-CM

## 2022-08-04 DIAGNOSIS — I5043 Acute on chronic combined systolic (congestive) and diastolic (congestive) heart failure: Secondary | ICD-10-CM | POA: Diagnosis not present

## 2022-08-04 DIAGNOSIS — E785 Hyperlipidemia, unspecified: Secondary | ICD-10-CM

## 2022-08-04 DIAGNOSIS — G4730 Sleep apnea, unspecified: Secondary | ICD-10-CM

## 2022-08-04 DIAGNOSIS — M545 Low back pain, unspecified: Secondary | ICD-10-CM

## 2022-08-04 DIAGNOSIS — D509 Iron deficiency anemia, unspecified: Secondary | ICD-10-CM

## 2022-08-04 DIAGNOSIS — H3530 Unspecified macular degeneration: Secondary | ICD-10-CM

## 2022-08-04 DIAGNOSIS — Z9181 History of falling: Secondary | ICD-10-CM

## 2022-08-04 DIAGNOSIS — G629 Polyneuropathy, unspecified: Secondary | ICD-10-CM

## 2022-08-04 DIAGNOSIS — G8929 Other chronic pain: Secondary | ICD-10-CM

## 2022-08-04 DIAGNOSIS — I130 Hypertensive heart and chronic kidney disease with heart failure and stage 1 through stage 4 chronic kidney disease, or unspecified chronic kidney disease: Secondary | ICD-10-CM | POA: Diagnosis not present

## 2022-08-04 DIAGNOSIS — Z7952 Long term (current) use of systemic steroids: Secondary | ICD-10-CM

## 2022-08-04 DIAGNOSIS — Z7901 Long term (current) use of anticoagulants: Secondary | ICD-10-CM

## 2022-08-04 DIAGNOSIS — Z9981 Dependence on supplemental oxygen: Secondary | ICD-10-CM

## 2022-08-04 DIAGNOSIS — I700 Atherosclerosis of aorta: Secondary | ICD-10-CM

## 2022-08-04 DIAGNOSIS — Z6833 Body mass index (BMI) 33.0-33.9, adult: Secondary | ICD-10-CM

## 2022-08-04 DIAGNOSIS — I2510 Atherosclerotic heart disease of native coronary artery without angina pectoris: Secondary | ICD-10-CM | POA: Diagnosis not present

## 2022-08-04 DIAGNOSIS — F3160 Bipolar disorder, current episode mixed, unspecified: Secondary | ICD-10-CM | POA: Diagnosis not present

## 2022-08-04 DIAGNOSIS — J9611 Chronic respiratory failure with hypoxia: Secondary | ICD-10-CM

## 2022-08-04 DIAGNOSIS — Z96651 Presence of right artificial knee joint: Secondary | ICD-10-CM

## 2022-08-04 DIAGNOSIS — Z96612 Presence of left artificial shoulder joint: Secondary | ICD-10-CM

## 2022-08-04 DIAGNOSIS — M1712 Unilateral primary osteoarthritis, left knee: Secondary | ICD-10-CM

## 2022-08-04 DIAGNOSIS — M800AXD Age-related osteoporosis with current pathological fracture, other site, subsequent encounter for fracture with routine healing: Secondary | ICD-10-CM | POA: Diagnosis not present

## 2022-08-04 DIAGNOSIS — I480 Paroxysmal atrial fibrillation: Secondary | ICD-10-CM | POA: Diagnosis not present

## 2022-08-04 DIAGNOSIS — Z981 Arthrodesis status: Secondary | ICD-10-CM

## 2022-08-04 DIAGNOSIS — Z8701 Personal history of pneumonia (recurrent): Secondary | ICD-10-CM

## 2022-08-04 DIAGNOSIS — F4323 Adjustment disorder with mixed anxiety and depressed mood: Secondary | ICD-10-CM | POA: Diagnosis not present

## 2022-08-04 DIAGNOSIS — Z96643 Presence of artificial hip joint, bilateral: Secondary | ICD-10-CM

## 2022-08-04 DIAGNOSIS — I088 Other rheumatic multiple valve diseases: Secondary | ICD-10-CM

## 2022-08-04 DIAGNOSIS — Z79899 Other long term (current) drug therapy: Secondary | ICD-10-CM

## 2022-08-04 DIAGNOSIS — J849 Interstitial pulmonary disease, unspecified: Secondary | ICD-10-CM

## 2022-08-04 DIAGNOSIS — G4700 Insomnia, unspecified: Secondary | ICD-10-CM

## 2022-08-04 DIAGNOSIS — Z8673 Personal history of transient ischemic attack (TIA), and cerebral infarction without residual deficits: Secondary | ICD-10-CM

## 2022-08-04 DIAGNOSIS — K219 Gastro-esophageal reflux disease without esophagitis: Secondary | ICD-10-CM

## 2022-08-04 DIAGNOSIS — N1831 Chronic kidney disease, stage 3a: Secondary | ICD-10-CM | POA: Diagnosis not present

## 2022-08-04 DIAGNOSIS — M069 Rheumatoid arthritis, unspecified: Secondary | ICD-10-CM

## 2022-08-04 DIAGNOSIS — Z96611 Presence of right artificial shoulder joint: Secondary | ICD-10-CM

## 2022-08-04 DIAGNOSIS — E663 Overweight: Secondary | ICD-10-CM

## 2022-08-04 DIAGNOSIS — Z7951 Long term (current) use of inhaled steroids: Secondary | ICD-10-CM

## 2022-08-04 DIAGNOSIS — M4316 Spondylolisthesis, lumbar region: Secondary | ICD-10-CM

## 2022-08-04 DIAGNOSIS — I739 Peripheral vascular disease, unspecified: Secondary | ICD-10-CM

## 2022-08-04 DIAGNOSIS — Z8711 Personal history of peptic ulcer disease: Secondary | ICD-10-CM

## 2022-08-04 DIAGNOSIS — Z87891 Personal history of nicotine dependence: Secondary | ICD-10-CM

## 2022-08-04 MED ORDER — LOVASTATIN 20 MG PO TABS
20.0000 mg | ORAL_TABLET | Freq: Every day | ORAL | 3 refills | Status: DC
Start: 2022-08-04 — End: 2023-03-26

## 2022-08-04 NOTE — Telephone Encounter (Signed)
Prescription Request  08/04/2022  LOV: 07/29/2022  What is the name of the medication or equipment? lovastatin   Have you contacted your pharmacy to request a refill? Yes   Which pharmacy would you like this sent to? [facility]   Patient notified that their request is being sent to the clinical staff for review and that they should receive a response within 2 business days.   Please advise at Mobile [phone] (mobile)

## 2022-08-04 NOTE — Telephone Encounter (Signed)
Called and spoke to Kerri Carter and she has been doing PT since Pt and OT starts today. [name] said once they are done evaluating her that they will fax over forms to be signed by the provider.

## 2022-08-05 ENCOUNTER — Ambulatory Visit: Payer: Medicare PPO | Attending: Family | Admitting: Family

## 2022-08-05 ENCOUNTER — Other Ambulatory Visit: Payer: Self-pay | Admitting: Oncology

## 2022-08-05 ENCOUNTER — Encounter: Payer: Self-pay | Admitting: Family

## 2022-08-05 VITALS — BP 129/53 | HR 79 | Ht 59.5 in | Wt 169.0 lb

## 2022-08-05 DIAGNOSIS — J4489 Other specified chronic obstructive pulmonary disease: Secondary | ICD-10-CM | POA: Insufficient documentation

## 2022-08-05 DIAGNOSIS — Z87891 Personal history of nicotine dependence: Secondary | ICD-10-CM | POA: Diagnosis not present

## 2022-08-05 DIAGNOSIS — Z7901 Long term (current) use of anticoagulants: Secondary | ICD-10-CM | POA: Insufficient documentation

## 2022-08-05 DIAGNOSIS — I10 Essential (primary) hypertension: Secondary | ICD-10-CM | POA: Diagnosis not present

## 2022-08-05 DIAGNOSIS — I080 Rheumatic disorders of both mitral and aortic valves: Secondary | ICD-10-CM

## 2022-08-05 DIAGNOSIS — Z7969 Long term (current) use of other immunomodulators and immunosuppressants: Secondary | ICD-10-CM | POA: Insufficient documentation

## 2022-08-05 DIAGNOSIS — K219 Gastro-esophageal reflux disease without esophagitis: Secondary | ICD-10-CM | POA: Insufficient documentation

## 2022-08-05 DIAGNOSIS — I480 Paroxysmal atrial fibrillation: Secondary | ICD-10-CM | POA: Diagnosis not present

## 2022-08-05 DIAGNOSIS — J449 Chronic obstructive pulmonary disease, unspecified: Secondary | ICD-10-CM

## 2022-08-05 DIAGNOSIS — Z7952 Long term (current) use of systemic steroids: Secondary | ICD-10-CM | POA: Insufficient documentation

## 2022-08-05 DIAGNOSIS — Z79899 Other long term (current) drug therapy: Secondary | ICD-10-CM | POA: Diagnosis not present

## 2022-08-05 DIAGNOSIS — G8929 Other chronic pain: Secondary | ICD-10-CM | POA: Diagnosis not present

## 2022-08-05 DIAGNOSIS — I2510 Atherosclerotic heart disease of native coronary artery without angina pectoris: Secondary | ICD-10-CM | POA: Insufficient documentation

## 2022-08-05 DIAGNOSIS — D631 Anemia in chronic kidney disease: Secondary | ICD-10-CM | POA: Diagnosis not present

## 2022-08-05 DIAGNOSIS — F419 Anxiety disorder, unspecified: Secondary | ICD-10-CM | POA: Insufficient documentation

## 2022-08-05 DIAGNOSIS — N1831 Chronic kidney disease, stage 3a: Secondary | ICD-10-CM | POA: Insufficient documentation

## 2022-08-05 DIAGNOSIS — J849 Interstitial pulmonary disease, unspecified: Secondary | ICD-10-CM | POA: Insufficient documentation

## 2022-08-05 DIAGNOSIS — I428 Other cardiomyopathies: Secondary | ICD-10-CM | POA: Insufficient documentation

## 2022-08-05 DIAGNOSIS — I5032 Chronic diastolic (congestive) heart failure: Secondary | ICD-10-CM

## 2022-08-05 DIAGNOSIS — D500 Iron deficiency anemia secondary to blood loss (chronic): Secondary | ICD-10-CM | POA: Diagnosis not present

## 2022-08-05 DIAGNOSIS — I38 Endocarditis, valve unspecified: Secondary | ICD-10-CM | POA: Diagnosis not present

## 2022-08-05 DIAGNOSIS — I5042 Chronic combined systolic (congestive) and diastolic (congestive) heart failure: Secondary | ICD-10-CM | POA: Insufficient documentation

## 2022-08-05 DIAGNOSIS — N1832 Chronic kidney disease, stage 3b: Secondary | ICD-10-CM

## 2022-08-05 DIAGNOSIS — F319 Bipolar disorder, unspecified: Secondary | ICD-10-CM | POA: Insufficient documentation

## 2022-08-05 DIAGNOSIS — E785 Hyperlipidemia, unspecified: Secondary | ICD-10-CM | POA: Diagnosis not present

## 2022-08-05 DIAGNOSIS — I350 Nonrheumatic aortic (valve) stenosis: Secondary | ICD-10-CM | POA: Diagnosis not present

## 2022-08-05 DIAGNOSIS — Z7951 Long term (current) use of inhaled steroids: Secondary | ICD-10-CM | POA: Insufficient documentation

## 2022-08-05 DIAGNOSIS — I130 Hypertensive heart and chronic kidney disease with heart failure and stage 1 through stage 4 chronic kidney disease, or unspecified chronic kidney disease: Secondary | ICD-10-CM | POA: Insufficient documentation

## 2022-08-05 MED ORDER — METOLAZONE 2.5 MG PO TABS: 2.5000 mg | ORAL_TABLET | Freq: Every day | ORAL | 0 refills | Status: DC

## 2022-08-05 MED ORDER — POTASSIUM CHLORIDE CRYS ER 20 MEQ PO TBCR: 40.0000 meq | EXTENDED_RELEASE_TABLET | Freq: Every day | ORAL | 0 refills | Status: DC

## 2022-08-05 NOTE — Progress Notes (Signed)
PCP: Dana Allan, MD (last seen 04/24) Primary Cardiologist: Fredirick Lathe, MD (last seen 11/23; returns 09/24)  HPI:  Kerri Carter is a 82 y/o female with a history of asthma, PAF, carotid disease, CAD, hyperlipidemia, HTN, CKD, anxiety, aortic stenosis, bipolar, conversion disorder, depression, GERD, ILD, macular degeneration, PAD, previous tobacco use and chronic heart failure.   Echo 03/30/22: EF 55-60% with mild/ moderate MR/ AR and mild/ moderate AS. Echo 07/29/21: EF of 55-60% along with moderate/severe MR but no LVH.   Admitted 07/16/22 due to increasing leg swelling as well as increasing shortness of breath while walking on level ground after self decreasing diuretic as she was going out of town. This progressed to the extent that just turning in bed, sitting up would cause the patient to have tachypnea and shortness of breath. IV diuresed. Admitted 03/28/22 due to SOB due to pneumonia and COPD. Antibiotics provided.   She presents today for a HF follow-up visit with a chief complaint of moderate SOB with minimal exertion. Chronic in nature. Has associated fatigue, pedal edema (worsening) and dizziness with sudden position changes. Denies cough, chest pain, palpitations, difficulty sleeping or weight gain.   Reports 169 pounds at home. Has been elevating legs without any improvement. Wearing oxygen at 3L around the clock.  Ate at [employer] ~ 10 days ago to celebrate a family member's birthday. Otherwise, doesn't recall eating out anywhere else.   ROS: All systems negative except as listed in HPI, PMH and Problem List.  SH:  Social History   Socioeconomic History   Marital status: Widowed    Spouse name: Kerri Carter   Number of children: 2   Years of education: Some Coll   Highest education level: Some college, no degree  Occupational History   Occupation: Retired    Comment: retired  Tobacco Use   Smoking status: Former    Packs/day: 0.50    Years: 20.00    Additional pack  years: 0.00    Total pack years: 10.00    Types: Cigarettes    Quit date: 02/02/1974    Years since quitting: 48.5   Smokeless tobacco: Never  Vaping Use   Vaping Use: Never used  Substance and Sexual Activity   Alcohol use: No   Drug use: No   Sexual activity: Not Currently  Other Topics Concern   Not on file  Social History Narrative   Lives at home in [place] grandson, his wife and their child.   Husband Kerri Carter died covid 57 late 1/2070married x 51 years   Right-handed.   6 cups coffee per day.   Social Determinants of Health   Financial Resource Strain: Low Risk  (06/18/2021)   Overall Financial Resource Strain (CARDIA)    Difficulty of Paying Living Expenses: Not hard at all  Food Insecurity: No Food Insecurity (07/17/2022)   Hunger Vital Sign    Worried About Running Out of Food in the Last Year: Never true    Ran Out of Food in the Last Year: Never true  Transportation Needs: No Transportation Needs (07/17/2022)   PRAPARE - Administrator, Civil Service (Medical): No    Lack of Transportation (Non-Medical): No  Physical Activity: Inactive (11/24/2019)   Exercise Vital Sign    Days of Exercise per Week: 0 days    Minutes of Exercise per Session: 0 min  Stress: No Stress Concern Present (06/18/2021)   [employer] of Occupational Health - Occupational Stress Questionnaire    Feeling  of Stress : Not at all  Social Connections: Moderately Integrated (06/18/2021)   Social Connection and Isolation Panel [NHANES]    Frequency of Communication with Friends and Family: More than three times a week    Frequency of Social Gatherings with Friends and Family: Never    Attends Religious Services: More than 4 times per year    Active Member of Clubs or Organizations: No    Attends Banker Meetings: Never    Marital Status: Married  Catering manager Violence: Not At Risk (07/17/2022)   Humiliation, Afraid, Rape, and Kick questionnaire    Fear  of Current or Ex-Partner: No    Emotionally Abused: No    Physically Abused: No    Sexually Abused: No    FH:  Family History  Problem Relation Age of Onset   Rheum arthritis Mother    Asthma Mother    Parkinson's disease Mother    Heart disease Mother    Stroke Mother    Hypertension Mother    Heart attack Father    Heart disease Father    Hypertension Father    Peripheral Artery Disease Father    Diabetes Son    Gout Son    Asthma Sister    Heart disease Sister    Lung cancer Sister    Heart disease Sister    Heart disease Sister    Breast cancer Sister    Heart attack Sister    Heart disease Brother    Heart disease Maternal Grandmother    Diabetes Maternal Grandmother    Colon cancer Maternal Grandmother    Cancer Maternal Grandmother        Hodgkins lymphoma   Heart disease Brother    Alcohol abuse Brother    Depression Brother    Dementia Son     Past Medical History:  Diagnosis Date   Acromial process of scapula fracture 12/21/2019   Acute kidney injury superimposed on chronic kidney disease (HCC) 05/26/2021   Acute on chronic combined systolic (congestive) and diastolic (congestive) heart failure (HCC) 07/28/2021   Acute on chronic combined systolic and diastolic CHF (congestive heart failure) (HCC) 05/26/2021   Acute on chronic respiratory failure with hypoxia (HCC) 09/10/2021   Acute pain of left knee 12/18/2020   Anemia in stage 3a chronic kidney disease (HCC) 10/09/2021   Anxiety    Anxiety 01/20/2019   Anxiety and depression 05/26/2021   Aortic stenosis 07/09/2015   a.) TTE 07/06/2015: EF 55-60%; mild AS with MPG 13.0 mmHg.   Arrhythmia    atrial fibrillation   Assistance needed for mobility 12/18/2020   Asthma    At high risk for falls 03/16/2019   Avascular necrosis of left shoulder due to adverse effect of steroid therapy (HCC) 01/20/2018   Bipolar disorder (HCC)    C. difficile diarrhea 2010   a.) following ABX course during hospital  admission   Carotid atherosclerosis, bilateral    a.) Moderate; < 50% stenosis BILATERAL ICAs.   Cataract    a.) s/p BILATERAL extraction   CHF (congestive heart failure) (HCC)    Chicken pox    Chronic left shoulder pain 08/29/2019   Chronic respiratory failure with hypoxia (HCC) 10/01/2021   CKD (chronic kidney disease), stage III (HCC)    a. s/p R nephrectomy./ aneurysm   Closed fracture of acromial process of scapula 10/28/2018   Closed nondisplaced fracture of proximal phalanx of lesser toe of right foot    Collapsed vertebra, not elsewhere  classified, lumbar region, initial encounter for fracture (HCC) 02/21/2019   Community acquired pneumonia 07/22/2021   Complicated grief 03/16/2019   Compression fracture of L4 vertebra (HCC) 02/21/2019   Conversion disorder    Conversion disorder 08/04/2015   COPD (chronic obstructive pulmonary disease) (HCC)    COPD with exacerbation (HCC) 08/20/2021   Cough 05/02/2012   Depression    Dislocation of hip joint prosthesis (HCC) 01/08/2017   Elevated brain natriuretic peptide (BNP) level 12/18/2020   Elevated d-dimer 05/26/2021   Episodic mood disorder (HCC) 04/18/2019   Essential hypertension    Fatigue 07/07/2018   Frequent falls 12/18/2020   GERD (gastroesophageal reflux disease)    H/O cardiac catheterization 02/24/2011   Formatting of this note might be different from the original.  Repeated at [facility] 2/13 and insignificant disease   Hav (hallux abducto valgus), unspecified laterality 12/05/2018   Heart murmur    Hip fracture (HCC) 10/31/2016   Hip pain 12/18/2020   History of fracture of left hip 01/15/2017   History of syncope 08/25/2016   Hyperkalemia 05/26/2021   - The patient was given p.o. Lokelma and IV Lasix.  - We will follow potassium level.   Hyperlipidemia    Hypokalemia 08/25/2016   ILD (interstitial lung disease) (HCC)    mild; 2/2 RA diagnosis   Iliopsoas bursitis of right hip 08/29/2019   Inflammatory  arthritis    a. hands/carpal tunnel.  b. Low titer rheumatoid factor. c. Negative anti-CCP antibodies. d. Plaquenil.   Insomnia 07/17/2015   Left foot pain 10/19/2016   Left lower lobe pneumonia 05/26/2021   Leg edema 05/02/2019   Leg pain, bilateral    Leg swelling 12/15/2016   Leukocytosis 10/19/2017   Leukopenia 06/24/2021   Lymphocytosis 07/13/2021   Macular degeneration    Microcytic anemia 08/15/2015   Mixed bipolar I disorder (HCC) 10/09/2015   Nocturnal hypoxemia    Nocturnal hypoxemia 09/09/2011   June 2013 overnight oximetry on 6 centimeters of water CPAP: SpO2 less than 88% 72% of the time   Non-Obstructive CAD    a. 07/2009 Cath ([facility]): nonobs dzs;  b. 03/2011 Cath [facility]): nonobs dzs.   NSIP (nonspecific interstitial pneumonitis) (HCC) 12/08/2019   Osteoarthritis    a. Knees.   Other shoulder lesions, left shoulder 12/20/2017   Other specified postprocedural states 07/20/2013   Overweight (BMI 25.0-29.9) 08/29/2019   PAD (peripheral artery disease) (HCC)    Pain due to onychomycosis of toenails of both feet 12/05/2018   Pelvic mass in female 03/16/2019   02/21/19 CT pelvis w/o contrast  Cystic left adnexal lesion has enlarged since 2018 where it  measured approximately 1.8 x 1.7 cm. This is not well assessed due  to streak artifact as well as lack of contrast. Ultrasound follow-up  may be helpful in 8-12 weeks      04/05/19 TVUS     IMPRESSION:  Simple appearing cystic structure within the left ovary measuring up  to 2.9 cm. While this has enlarged s   Physical deconditioning 02/27/2021   Pleural effusion on left 12/18/2020   PUD (peptic ulcer disease)    Recurrent falls 09/01/2021   Recurrent pneumonia 07/23/2021   Repeated falls 12/18/2020   Respiratory failure with hypoxia (HCC) 01/12/2021   Rotator cuff tendinitis, right 07/26/2017   S/P lumbar fusion 05/23/2021   S/P right hip fracture    11/01/16 s/p repair   Sepsis due to pneumonia (HCC) 07/22/2021   Shoulder  pain    Sleep  apnea    no cpap / minimal   SOB (shortness of breath) 10/13/2011   2011 [facility] eval >> LHC (non-obstructive CAD), RHC (wedge 13, mean PA 26), TTE (LVEF > 55%, mild concentric LVH, mild AR, MR, PR, TR), CTAngio chest unrevealing; "well controlled asthma"  02/2011 TTE [facility]>> NL LVEF, Mild LVH, Mod AR/Mod MR   Spinal stenosis at L4-L5 level    severe with L4/L5 anterolisthesis grade 1 anterolisthesis    Spinal stenosis of lumbar region 09/15/2016   Last Assessment & Plan:   Now post op with continued back and leg pain noted. Is working with pt/ot and surgery was said to have no complications. Bowels are moving and general pain and anxiety is basically baseline.    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   Weakness of both lower extremities 12/18/2020    Current Outpatient Medications  Medication Sig Dispense Refill   albuterol (PROVENTIL) (2.5 MG/3ML) 0.083% nebulizer solution Take by nebulization.     albuterol (VENTOLIN HFA) 108 (90 Base) MCG/ACT inhaler Inhale 2 puffs into the lungs every 6 (six) hours as needed for wheezing or shortness of breath. 18 g 11   amLODipine (NORVASC) 5 MG tablet Take 1 tablet (5 mg total) by mouth daily. 90 tablet 3   apixaban (ELIQUIS) 5 MG TABS tablet Take 1 tablet (5 mg total) by mouth 2 (two) times daily. 180 tablet 1   busPIRone (BUSPAR) 5 MG tablet Take 1 tablet (5 mg total) by mouth 2 (two) times daily.      cetirizine (ZYRTEC) 10 MG tablet TAKE 1 TABLET BY MOUTH EVERYDAY AT BEDTIME 90 tablet 3   dicyclomine (BENTYL) 10 MG capsule Take 1 capsule (10 mg total) by mouth 3 (three) times daily before meals. 90 capsule 0   ENBREL SURECLICK 50 MG/ML injection Inject 50 mg into the skin once a week. Takes on Fridays     escitalopram (LEXAPRO) 10 MG tablet Take 1 tablet (10 mg total) by mouth daily. 90 tablet 3   Fluticasone-Umeclidin-Vilant (TRELEGY ELLIPTA) 100-62.5-25 MCG/ACT AEPB Inhale 1 puff into the lungs daily. 60 each 6   gabapentin (NEURONTIN) 300 MG capsule Take 300 mg in the morning and 600 mg at night.     ipratropium-albuterol (DUONEB) 0.5-2.5 (3) MG/3ML SOLN SMARTSIG:3 Milliliter(s) Via Nebulizer Every 6 Hours PRN     lamoTRIgine (LAMICTAL) 100 MG tablet TAKE 1 TAB 2 TIMES DAILY. FURTHER REFILLS NEW PSYCH FOR ALL PSYCH MEDS ONLY TEMP SUPPLY FROM PCP 180 tablet 1   leflunomide (ARAVA) 20 MG tablet Take 1 tablet (20 mg total) by mouth daily.     lidocaine (LIDODERM) 5 % Place 1 patch onto the skin 2 (two) times daily as needed. Remove & Discard patch within 12 hours or as directed by MD 120 patch 2   lovastatin (MEVACOR) 20 MG tablet Take 1 tablet (20 mg total) by mouth daily at 6 PM. 90 tablet 3   methocarbamol (ROBAXIN)  500 MG tablet Take 500 mg by mouth 4 (four) times daily as needed for muscle spasms. Taking twice a day currently     mometasone (NASONEX) 50 MCG/ACT nasal spray 1 spray to each nostril twice a day 1 each 3   montelukast (SINGULAIR) 10 MG tablet Take 1 tablet (10 mg total) by mouth daily. 30 tablet 2   multivitamin-lutein (OCUVITE-LUTEIN) CAPS capsule Take 1 capsule by mouth at bedtime.     nebivolol (BYSTOLIC) 5 MG tablet Take 1 tablet (5 mg total) by mouth in the morning and at bedtime. (Note dose changed from 1/2 10 mg bid to 5 mg bid) 180 tablet 3   OXYGEN Inhale 3 L into the lungs continuous.     pantoprazole (PROTONIX) 40 MG tablet TAKE 1 TABLET BY MOUTH 2 TIMES DAILY 30  MIN BEFORE FOOD (NOTE REDUCTION IN FREQUENCY) 180 tablet 1   predniSONE (DELTASONE) 5 MG tablet TAKE 1 TABLET BY MOUTH EVERY DAY WITH BREAKFAST 30 tablet 3   PROLIA 60 MG/ML SOSY injection Inject 60 mg into the skin every 6 (six) months.     QUEtiapine (SEROQUEL) 25 MG tablet TAKE 1 TABLET (25 MG TOTAL) BY MOUTH AT BEDTIME. AGAIN LAST FILL FURTHER REFILLS FROM PSYCHIATRY NO EXCEPTIONS 90 tablet 0   sucralfate (CARAFATE) 1 g tablet TAKE 1 TABLET BY MOUTH 2 TIMES DAILY. 180 tablet 2   torsemide (DEMADEX) 20 MG tablet Take 2 tablets (40 mg total) by mouth daily. 60 tablet 0   traMADol (ULTRAM) 50 MG tablet Take 1 tablet (50 mg total) by mouth every 12 (twelve) hours as needed. 60 tablet 1   Vibegron (GEMTESA) 75 MG TABS Take 75 mg by mouth daily. 90 tablet 3   No current facility-administered medications for this visit.   Vitals:   08/05/22 1029  BP: (!) 129/53  Pulse: 79  SpO2: 95%  Weight: 169 lb (76.7 kg)  Height: 4' 11.5" (1.511 m)   Wt Readings from Last 3 Encounters:  08/05/22 169 lb (76.7 kg)  07/29/22 170 lb (77.1 kg)  07/28/22 166 lb (75.3 kg)   Lab Results  Component Value Date   CREATININE 1.16 (H) 07/17/2022   CREATININE 1.21 (H) 07/16/2022   CREATININE 1.70 (H) 06/04/2022   PHYSICAL EXAM:  General:  Well appearing. No resp difficulty HEENT: normal Neck: supple. JVP flat. No lymphadenopathy or thryomegaly appreciated. Cor: PMI normal. Regular rate & rhythm. No rubs, gallops or murmurs. Lungs: clear Abdomen: soft, nontender, nondistended. No hepatosplenomegaly. No bruits or masses.  Extremities: no cyanosis, clubbing, rash, 1+ pitting edema bilateral lower legs Neuro: alert & oriented x3, cranial nerves grossly intact. Moves all 4 extremities w/o difficulty. Affect pleasant.   ECG: not done  ASSESSMENT & PLAN:  1: NICM with preserved ejection fraction - - etiology likely valvular/ HTN - NYHA class III - minimally fluid overloaded today with SOB & increasing  pedal edema - weighing daily; reminded to call for an overnight weight gain of > 2 pounds or a weekly weight gain of > 5 pounds - weight down 3 pounds from last visit here 2 months ago - echo 03/30/22: EF 55-60% with mild/ moderate MR/ AR and mild/ moderate AS. Echo 07/29/21: EF of 55-60% along with moderate/severe MR but no LVH.  - continue torsemide 40mg  daily - continue nebivolol 5mg  BID - add metolazone 2.5mg  every other day X 2 dose; with each dose, take potassium - BMP next week - not adding salt and reviewed the  importance of keeping daily sodium intake to 2000mg ;  - ate at [employer] ~ 10 days ago and knew it was high in sodium but it was a birthday celebration and she didn't want to say no; says that she hasn't eaten out anywhere else - drinking ~ 60-64 ounces of fluid daily - BNP 07/16/22 was 446.2  2: HTN with CKD- - BP 129/53 - continue amlodipine 5mg  daily - saw PCP Clent Ridges) 04/24 - saw nephrology Thedore Mins) 03/24 - BMP 07/17/22 reviewed and showed sodium 139, potassium 3.4, creatinine 1.16 & GFR 48  3: COPD- - saw pulmonology Jayme Cloud) 04/24 - wearing oxygen at 3L around the clock  - daily prednisone  4: Valvular disease- - saw cardiology (Povsic) 06/24 - plan is to monitor her valvular disease  5: Anemia- - saw hematology Cathie Hoops) 2/24 - saw GI Tobi Bastos) 10/23 - hemoglobin 07/17/22 was 11.0 - ferrous sulfate 325mg  BID  Return in 1 week, sooner if needed.

## 2022-08-05 NOTE — Patient Instructions (Signed)
Take the booster fluid pill (metolazone) 1/2 hour before your morning torsemide. You will take this on Thursday and Saturday of this week. On these 2 days, you will also take 2 potassium tablets on Thursday and again on Saturday.

## 2022-08-10 ENCOUNTER — Encounter: Payer: Self-pay | Admitting: Internal Medicine

## 2022-08-10 ENCOUNTER — Inpatient Hospital Stay
Admission: EM | Admit: 2022-08-10 | Discharge: 2022-08-14 | DRG: 683 | Disposition: A | Discharge status: Home Health | Payer: Medicare PPO | Attending: Internal Medicine | Admitting: Internal Medicine

## 2022-08-10 ENCOUNTER — Other Ambulatory Visit: Payer: Self-pay

## 2022-08-10 ENCOUNTER — Emergency Department: Payer: Medicare PPO

## 2022-08-10 DIAGNOSIS — I739 Peripheral vascular disease, unspecified: Secondary | ICD-10-CM | POA: Diagnosis present

## 2022-08-10 DIAGNOSIS — F3160 Bipolar disorder, current episode mixed, unspecified: Secondary | ICD-10-CM | POA: Diagnosis not present

## 2022-08-10 DIAGNOSIS — N1831 Chronic kidney disease, stage 3a: Secondary | ICD-10-CM | POA: Diagnosis present

## 2022-08-10 DIAGNOSIS — M069 Rheumatoid arthritis, unspecified: Secondary | ICD-10-CM | POA: Diagnosis present

## 2022-08-10 DIAGNOSIS — J8489 Other specified interstitial pulmonary diseases: Secondary | ICD-10-CM | POA: Diagnosis present

## 2022-08-10 DIAGNOSIS — K219 Gastro-esophageal reflux disease without esophagitis: Secondary | ICD-10-CM | POA: Diagnosis present

## 2022-08-10 DIAGNOSIS — E876 Hypokalemia: Secondary | ICD-10-CM | POA: Diagnosis present

## 2022-08-10 DIAGNOSIS — G8929 Other chronic pain: Secondary | ICD-10-CM | POA: Diagnosis present

## 2022-08-10 DIAGNOSIS — Z7951 Long term (current) use of inhaled steroids: Secondary | ICD-10-CM

## 2022-08-10 DIAGNOSIS — F419 Anxiety disorder, unspecified: Secondary | ICD-10-CM | POA: Diagnosis present

## 2022-08-10 DIAGNOSIS — K439 Ventral hernia without obstruction or gangrene: Secondary | ICD-10-CM | POA: Diagnosis not present

## 2022-08-10 DIAGNOSIS — Z803 Family history of malignant neoplasm of breast: Secondary | ICD-10-CM

## 2022-08-10 DIAGNOSIS — F4323 Adjustment disorder with mixed anxiety and depressed mood: Secondary | ICD-10-CM | POA: Diagnosis not present

## 2022-08-10 DIAGNOSIS — Z800 Family history of malignant neoplasm of digestive organs: Secondary | ICD-10-CM

## 2022-08-10 DIAGNOSIS — D500 Iron deficiency anemia secondary to blood loss (chronic): Secondary | ICD-10-CM | POA: Diagnosis present

## 2022-08-10 DIAGNOSIS — J849 Interstitial pulmonary disease, unspecified: Secondary | ICD-10-CM | POA: Diagnosis present

## 2022-08-10 DIAGNOSIS — I480 Paroxysmal atrial fibrillation: Secondary | ICD-10-CM | POA: Diagnosis present

## 2022-08-10 DIAGNOSIS — K529 Noninfective gastroenteritis and colitis, unspecified: Secondary | ICD-10-CM | POA: Insufficient documentation

## 2022-08-10 DIAGNOSIS — Z823 Family history of stroke: Secondary | ICD-10-CM

## 2022-08-10 DIAGNOSIS — R112 Nausea with vomiting, unspecified: Secondary | ICD-10-CM

## 2022-08-10 DIAGNOSIS — E669 Obesity, unspecified: Secondary | ICD-10-CM | POA: Diagnosis present

## 2022-08-10 DIAGNOSIS — R0789 Other chest pain: Secondary | ICD-10-CM | POA: Diagnosis not present

## 2022-08-10 DIAGNOSIS — Z818 Family history of other mental and behavioral disorders: Secondary | ICD-10-CM

## 2022-08-10 DIAGNOSIS — Z820 Family history of epilepsy and other diseases of the nervous system: Secondary | ICD-10-CM

## 2022-08-10 DIAGNOSIS — Z66 Do not resuscitate: Secondary | ICD-10-CM | POA: Diagnosis present

## 2022-08-10 DIAGNOSIS — I5042 Chronic combined systolic (congestive) and diastolic (congestive) heart failure: Secondary | ICD-10-CM | POA: Diagnosis present

## 2022-08-10 DIAGNOSIS — M810 Age-related osteoporosis without current pathological fracture: Secondary | ICD-10-CM | POA: Diagnosis present

## 2022-08-10 DIAGNOSIS — Z833 Family history of diabetes mellitus: Secondary | ICD-10-CM

## 2022-08-10 DIAGNOSIS — Z807 Family history of other malignant neoplasms of lymphoid, hematopoietic and related tissues: Secondary | ICD-10-CM

## 2022-08-10 DIAGNOSIS — Z6833 Body mass index (BMI) 33.0-33.9, adult: Secondary | ICD-10-CM

## 2022-08-10 DIAGNOSIS — Z801 Family history of malignant neoplasm of trachea, bronchus and lung: Secondary | ICD-10-CM

## 2022-08-10 DIAGNOSIS — J4489 Other specified chronic obstructive pulmonary disease: Secondary | ICD-10-CM | POA: Diagnosis present

## 2022-08-10 DIAGNOSIS — Z96612 Presence of left artificial shoulder joint: Secondary | ICD-10-CM | POA: Diagnosis present

## 2022-08-10 DIAGNOSIS — F319 Bipolar disorder, unspecified: Secondary | ICD-10-CM | POA: Diagnosis present

## 2022-08-10 DIAGNOSIS — Z91048 Other nonmedicinal substance allergy status: Secondary | ICD-10-CM

## 2022-08-10 DIAGNOSIS — I2510 Atherosclerotic heart disease of native coronary artery without angina pectoris: Secondary | ICD-10-CM | POA: Diagnosis present

## 2022-08-10 DIAGNOSIS — A0472 Enterocolitis due to Clostridium difficile, not specified as recurrent: Secondary | ICD-10-CM | POA: Diagnosis present

## 2022-08-10 DIAGNOSIS — J9811 Atelectasis: Secondary | ICD-10-CM | POA: Diagnosis not present

## 2022-08-10 DIAGNOSIS — Z882 Allergy status to sulfonamides status: Secondary | ICD-10-CM

## 2022-08-10 DIAGNOSIS — Z8249 Family history of ischemic heart disease and other diseases of the circulatory system: Secondary | ICD-10-CM

## 2022-08-10 DIAGNOSIS — E785 Hyperlipidemia, unspecified: Secondary | ICD-10-CM | POA: Diagnosis present

## 2022-08-10 DIAGNOSIS — J9611 Chronic respiratory failure with hypoxia: Secondary | ICD-10-CM | POA: Diagnosis present

## 2022-08-10 DIAGNOSIS — I722 Aneurysm of renal artery: Secondary | ICD-10-CM | POA: Diagnosis not present

## 2022-08-10 DIAGNOSIS — Z981 Arthrodesis status: Secondary | ICD-10-CM

## 2022-08-10 DIAGNOSIS — R9431 Abnormal electrocardiogram [ECG] [EKG]: Secondary | ICD-10-CM | POA: Diagnosis not present

## 2022-08-10 DIAGNOSIS — D72829 Elevated white blood cell count, unspecified: Secondary | ICD-10-CM | POA: Diagnosis present

## 2022-08-10 DIAGNOSIS — Z96643 Presence of artificial hip joint, bilateral: Secondary | ICD-10-CM | POA: Diagnosis present

## 2022-08-10 DIAGNOSIS — Z886 Allergy status to analgesic agent status: Secondary | ICD-10-CM

## 2022-08-10 DIAGNOSIS — R197 Diarrhea, unspecified: Principal | ICD-10-CM

## 2022-08-10 DIAGNOSIS — N179 Acute kidney failure, unspecified: Secondary | ICD-10-CM | POA: Diagnosis present

## 2022-08-10 DIAGNOSIS — Z7901 Long term (current) use of anticoagulants: Secondary | ICD-10-CM | POA: Diagnosis not present

## 2022-08-10 DIAGNOSIS — Z905 Acquired absence of kidney: Secondary | ICD-10-CM

## 2022-08-10 DIAGNOSIS — N83292 Other ovarian cyst, left side: Secondary | ICD-10-CM | POA: Diagnosis not present

## 2022-08-10 DIAGNOSIS — I130 Hypertensive heart and chronic kidney disease with heart failure and stage 1 through stage 4 chronic kidney disease, or unspecified chronic kidney disease: Secondary | ICD-10-CM | POA: Diagnosis present

## 2022-08-10 DIAGNOSIS — Z885 Allergy status to narcotic agent status: Secondary | ICD-10-CM

## 2022-08-10 DIAGNOSIS — Z79899 Other long term (current) drug therapy: Secondary | ICD-10-CM

## 2022-08-10 DIAGNOSIS — Z96651 Presence of right artificial knee joint: Secondary | ICD-10-CM | POA: Diagnosis present

## 2022-08-10 DIAGNOSIS — Z888 Allergy status to other drugs, medicaments and biological substances status: Secondary | ICD-10-CM

## 2022-08-10 DIAGNOSIS — I5032 Chronic diastolic (congestive) heart failure: Secondary | ICD-10-CM | POA: Diagnosis present

## 2022-08-10 DIAGNOSIS — I5043 Acute on chronic combined systolic (congestive) and diastolic (congestive) heart failure: Secondary | ICD-10-CM | POA: Diagnosis not present

## 2022-08-10 DIAGNOSIS — Z8261 Family history of arthritis: Secondary | ICD-10-CM

## 2022-08-10 DIAGNOSIS — I10 Essential (primary) hypertension: Secondary | ICD-10-CM | POA: Diagnosis not present

## 2022-08-10 DIAGNOSIS — Z91041 Radiographic dye allergy status: Secondary | ICD-10-CM

## 2022-08-10 DIAGNOSIS — Z96611 Presence of right artificial shoulder joint: Secondary | ICD-10-CM | POA: Diagnosis present

## 2022-08-10 DIAGNOSIS — D631 Anemia in chronic kidney disease: Secondary | ICD-10-CM | POA: Diagnosis present

## 2022-08-10 DIAGNOSIS — Z881 Allergy status to other antibiotic agents status: Secondary | ICD-10-CM

## 2022-08-10 DIAGNOSIS — Z811 Family history of alcohol abuse and dependence: Secondary | ICD-10-CM

## 2022-08-10 DIAGNOSIS — M800AXD Age-related osteoporosis with current pathological fracture, other site, subsequent encounter for fracture with routine healing: Secondary | ICD-10-CM | POA: Diagnosis not present

## 2022-08-10 DIAGNOSIS — R110 Nausea: Secondary | ICD-10-CM | POA: Diagnosis not present

## 2022-08-10 DIAGNOSIS — Z825 Family history of asthma and other chronic lower respiratory diseases: Secondary | ICD-10-CM

## 2022-08-10 DIAGNOSIS — Z87891 Personal history of nicotine dependence: Secondary | ICD-10-CM

## 2022-08-10 HISTORY — DX: Nausea with vomiting, unspecified: R11.2

## 2022-08-10 LAB — COMPREHENSIVE METABOLIC PANEL
ALT: 18 U/L (ref 0–44)
AST: 28 U/L (ref 15–41)
Albumin: 4.5 g/dL (ref 3.5–5.0)
Alkaline Phosphatase: 96 U/L (ref 38–126)
Anion gap: 18 — ABNORMAL HIGH (ref 5–15)
BUN: 66 mg/dL — ABNORMAL HIGH (ref 8–23)
CO2: 30 mmol/L (ref 22–32)
Calcium: 9.8 mg/dL (ref 8.9–10.3)
Chloride: 85 mmol/L — ABNORMAL LOW (ref 98–111)
Creatinine, Ser: 2.19 mg/dL — ABNORMAL HIGH (ref 0.44–1.00)
GFR, Estimated: 22 mL/min — ABNORMAL LOW (ref >60)
Glucose, Bld: 169 mg/dL — ABNORMAL HIGH (ref 70–99)
Potassium: 3.5 mmol/L (ref 3.5–5.1)
Sodium: 133 mmol/L — ABNORMAL LOW (ref 135–145)
Total Bilirubin: 0.9 mg/dL (ref 0.3–1.2)
Total Protein: 8.6 g/dL — ABNORMAL HIGH (ref 6.5–8.1)

## 2022-08-10 LAB — CBC
HCT: 45.3 % (ref 36.0–46.0)
Hemoglobin: 14.6 g/dL (ref 12.0–15.0)
MCH: 27.3 pg (ref 26.0–34.0)
MCHC: 32.2 g/dL (ref 30.0–36.0)
MCV: 84.7 fL (ref 80.0–100.0)
Platelets: 277 10*3/uL (ref 150–400)
RBC: 5.35 MIL/uL — ABNORMAL HIGH (ref 3.87–5.11)
RDW: 15.4 % (ref 11.5–15.5)
WBC: 15.3 10*3/uL — ABNORMAL HIGH (ref 4.0–10.5)
nRBC: 0 % (ref 0.0–0.2)

## 2022-08-10 LAB — LIPASE, BLOOD: Lipase: 78 U/L — ABNORMAL HIGH (ref 11–51)

## 2022-08-10 LAB — TROPONIN I (HIGH SENSITIVITY)
Troponin I (High Sensitivity): 28 ng/L — ABNORMAL HIGH (ref <18)
Troponin I (High Sensitivity): 24 ng/L — ABNORMAL HIGH (ref <18)

## 2022-08-10 MED ORDER — SUCRALFATE 1 G PO TABS
1.0000 g | ORAL_TABLET | Freq: Two times a day (BID) | ORAL | Status: DC
  Administered 2022-08-11 – 2022-08-14 (×7): 1 g via ORAL
  Filled 2022-08-10 (×7): qty 1

## 2022-08-10 MED ORDER — LAMOTRIGINE 100 MG PO TABS
100.0000 mg | ORAL_TABLET | Freq: Two times a day (BID) | ORAL | Status: DC
  Administered 2022-08-11 – 2022-08-14 (×8): 100 mg via ORAL
  Filled 2022-08-10 (×8): qty 1

## 2022-08-10 MED ORDER — NEBIVOLOL HCL 5 MG PO TABS
5.0000 mg | ORAL_TABLET | Freq: Two times a day (BID) | ORAL | Status: DC
  Administered 2022-08-11 – 2022-08-14 (×7): 5 mg via ORAL
  Filled 2022-08-10 (×7): qty 1

## 2022-08-10 MED ORDER — ESCITALOPRAM OXALATE 10 MG PO TABS
10.0000 mg | ORAL_TABLET | Freq: Every day | ORAL | Status: DC
  Administered 2022-08-11 – 2022-08-14 (×4): 10 mg via ORAL
  Filled 2022-08-10 (×4): qty 1

## 2022-08-10 MED ORDER — ACETAMINOPHEN 650 MG RE SUPP: 650.0000 mg | Freq: Four times a day (QID) | RECTAL | Status: DC | PRN

## 2022-08-10 MED ORDER — GABAPENTIN 300 MG PO CAPS: 600.0000 mg | ORAL_CAPSULE | Freq: Two times a day (BID) | ORAL | Status: DC

## 2022-08-10 MED ORDER — APIXABAN 5 MG PO TABS: 5.0000 mg | ORAL_TABLET | Freq: Two times a day (BID) | ORAL | Status: DC

## 2022-08-10 MED ORDER — METHOCARBAMOL 500 MG PO TABS
500.0000 mg | ORAL_TABLET | Freq: Four times a day (QID) | ORAL | Status: DC | PRN
  Administered 2022-08-11 – 2022-08-14 (×8): 500 mg via ORAL
  Filled 2022-08-10 (×8): qty 1

## 2022-08-10 MED ORDER — PRAVASTATIN SODIUM 20 MG PO TABS
20.0000 mg | ORAL_TABLET | Freq: Every day | ORAL | Status: DC
  Administered 2022-08-11 – 2022-08-13 (×3): 20 mg via ORAL
  Filled 2022-08-10 (×3): qty 1

## 2022-08-10 MED ORDER — SENNOSIDES-DOCUSATE SODIUM 8.6-50 MG PO TABS: 1.0000 | ORAL_TABLET | Freq: Every evening | ORAL | Status: DC | PRN

## 2022-08-10 MED ORDER — DICYCLOMINE HCL 10 MG PO CAPS
10.0000 mg | ORAL_CAPSULE | Freq: Three times a day (TID) | ORAL | Status: DC
  Administered 2022-08-11 – 2022-08-14 (×11): 10 mg via ORAL
  Filled 2022-08-10 (×13): qty 1

## 2022-08-10 MED ORDER — ONDANSETRON HCL 4 MG PO TABS: 4.0000 mg | ORAL_TABLET | Freq: Four times a day (QID) | ORAL | Status: DC | PRN

## 2022-08-10 MED ORDER — LEFLUNOMIDE 10 MG PO TABS
20.0000 mg | ORAL_TABLET | Freq: Every day | ORAL | Status: DC
  Administered 2022-08-11 – 2022-08-14 (×4): 20 mg via ORAL
  Filled 2022-08-10: qty 2
  Filled 2022-08-10: qty 1
  Filled 2022-08-10 (×3): qty 2

## 2022-08-10 MED ORDER — TRAMADOL HCL 50 MG PO TABS
50.0000 mg | ORAL_TABLET | Freq: Two times a day (BID) | ORAL | Status: DC | PRN
  Administered 2022-08-10 – 2022-08-11 (×3): 50 mg via ORAL
  Filled 2022-08-10 (×3): qty 1

## 2022-08-10 MED ORDER — PANTOPRAZOLE SODIUM 40 MG PO TBEC
40.0000 mg | DELAYED_RELEASE_TABLET | Freq: Two times a day (BID) | ORAL | Status: DC
  Administered 2022-08-11 – 2022-08-14 (×7): 40 mg via ORAL
  Filled 2022-08-10 (×7): qty 1

## 2022-08-10 MED ORDER — PREDNISONE 10 MG PO TABS
5.0000 mg | ORAL_TABLET | Freq: Every day | ORAL | Status: DC
  Administered 2022-08-11 – 2022-08-14 (×4): 5 mg via ORAL
  Filled 2022-08-10 (×4): qty 1

## 2022-08-10 MED ORDER — ONDANSETRON HCL 4 MG/2ML IJ SOLN: 4.0000 mg | Freq: Four times a day (QID) | INTRAMUSCULAR | Status: DC | PRN

## 2022-08-10 MED ORDER — IPRATROPIUM-ALBUTEROL 0.5-2.5 (3) MG/3ML IN SOLN: 3.0000 mL | Freq: Four times a day (QID) | RESPIRATORY_TRACT | Status: DC | PRN

## 2022-08-10 MED ORDER — ACETAMINOPHEN 325 MG PO TABS
650.0000 mg | ORAL_TABLET | Freq: Four times a day (QID) | ORAL | Status: DC | PRN
  Administered 2022-08-10 – 2022-08-12 (×5): 650 mg via ORAL
  Filled 2022-08-10 (×6): qty 2

## 2022-08-10 MED ORDER — QUETIAPINE FUMARATE 25 MG PO TABS
25.0000 mg | ORAL_TABLET | Freq: Every day | ORAL | Status: DC
  Administered 2022-08-11 – 2022-08-13 (×4): 25 mg via ORAL
  Filled 2022-08-10 (×4): qty 1

## 2022-08-10 MED ORDER — ONDANSETRON HCL 4 MG/2ML IJ SOLN
4.0000 mg | Freq: Once | INTRAMUSCULAR | Status: AC
  Administered 2022-08-10: 4 mg via INTRAVENOUS
  Filled 2022-08-10: qty 2

## 2022-08-10 MED ORDER — SODIUM CHLORIDE 0.9 % IV SOLN: INTRAVENOUS | Status: AC

## 2022-08-10 MED ORDER — FLUTICASONE FUROATE-VILANTEROL 100-25 MCG/ACT IN AEPB
1.0000 | INHALATION_SPRAY | Freq: Every day | RESPIRATORY_TRACT | Status: DC
  Administered 2022-08-11 – 2022-08-13 (×3): 1 via RESPIRATORY_TRACT
  Filled 2022-08-10: qty 28

## 2022-08-10 MED ORDER — UMECLIDINIUM BROMIDE 62.5 MCG/ACT IN AEPB
1.0000 | INHALATION_SPRAY | Freq: Every day | RESPIRATORY_TRACT | Status: DC
  Administered 2022-08-11 – 2022-08-13 (×3): 1 via RESPIRATORY_TRACT
  Filled 2022-08-10: qty 7

## 2022-08-10 MED ORDER — HEPARIN SODIUM (PORCINE) 5000 UNIT/ML IJ SOLN
5000.0000 [IU] | Freq: Three times a day (TID) | INTRAMUSCULAR | Status: DC
  Administered 2022-08-10: 5000 [IU] via SUBCUTANEOUS
  Filled 2022-08-10: qty 1

## 2022-08-10 MED ORDER — HYDRALAZINE HCL 20 MG/ML IJ SOLN
5.0000 mg | Freq: Three times a day (TID) | INTRAMUSCULAR | Status: DC | PRN
  Administered 2022-08-13: 5 mg via INTRAVENOUS
  Filled 2022-08-10: qty 1

## 2022-08-10 MED ORDER — AMLODIPINE BESYLATE 5 MG PO TABS
5.0000 mg | ORAL_TABLET | Freq: Every day | ORAL | Status: DC
  Administered 2022-08-11 – 2022-08-14 (×4): 5 mg via ORAL
  Filled 2022-08-10 (×4): qty 1

## 2022-08-10 MED ORDER — BUSPIRONE HCL 10 MG PO TABS
5.0000 mg | ORAL_TABLET | Freq: Two times a day (BID) | ORAL | Status: DC
  Administered 2022-08-11 – 2022-08-14 (×8): 5 mg via ORAL
  Filled 2022-08-10 (×8): qty 1

## 2022-08-10 MED ORDER — SODIUM CHLORIDE 0.9 % IV SOLN
12.5000 mg | Freq: Three times a day (TID) | INTRAVENOUS | Status: DC | PRN
  Filled 2022-08-10: qty 0.5

## 2022-08-10 MED ORDER — SODIUM CHLORIDE 0.9 % IV BOLUS
500.0000 mL | Freq: Once | INTRAVENOUS | Status: AC
  Administered 2022-08-10: 500 mL via INTRAVENOUS

## 2022-08-10 MED ORDER — MONTELUKAST SODIUM 10 MG PO TABS
10.0000 mg | ORAL_TABLET | Freq: Every day | ORAL | Status: DC
  Administered 2022-08-11 – 2022-08-14 (×4): 10 mg via ORAL
  Filled 2022-08-10 (×4): qty 1

## 2022-08-10 NOTE — Assessment & Plan Note (Addendum)
Home amlodipine 5 mg daily, nebivolol 5 mg per home dosing resumed Hydralazine 5 mg IV every 8 hours as needed for SBP greater than 170, 4 days ordered

## 2022-08-10 NOTE — Assessment & Plan Note (Addendum)
Suspect viral Symptomatic support

## 2022-08-10 NOTE — ED Provider Notes (Signed)
[facility] Provider Note    Event Date/Time   First MD Initiated Contact with Patient 08/10/22 1628     (approximate)   History   Nausea and Abdominal Pain   HPI  Kerri Carter is a 82 y.o. female with a history of COPD, CHF, paroxysmal atrial fibrillation on Eliquis and RA who presents with vomiting and abdominal pain, acute onset this morning.  The patient states that she had 1 episode of vomiting which was brownish in color but did not have any blood or coffee grounds.  She then was feeling better, tried to eat and drink, and 1 to 2 hours later started to have more intense vomiting and had a loose bowel movement at the same time.  The patient reports associated crampy diffuse abdominal pain.  She denies any sick contacts.  She states she did not eat anything out of the ordinary today or yesterday.  She has not traveled.  I reviewed the past medical records.  The patient was admitted to the hospital service in June for acute on chronic CHF.   Physical Exam   Triage Vital Signs: ED Triage Vitals  Enc Vitals Group     BP 08/10/22 1519 112/61     Pulse Rate 08/10/22 1519 85     Resp 08/10/22 1519 19     Temp 08/10/22 1519 98 F (36.7 C)     Temp src --      SpO2 08/10/22 1519 97 %     Weight --      Height --      Head Circumference --      Peak Flow --      Pain Score 08/10/22 1518 10     Pain Loc --      Pain Edu? --      Excl. in GC? --     Most recent vital signs: Vitals:   08/10/22 1519  BP: 112/61  Pulse: 85  Resp: 19  Temp: 98 F (36.7 C)  SpO2: 97%     General: Awake, no distress.  CV:  Good peripheral perfusion.  Resp:  Normal effort.  Abd:  Soft with mild diffuse discomfort.  No focal tenderness or peritoneal signs.  No distention.  Other:  No jaundice or scleral icterus.   ED Results / Procedures / Treatments   Labs (all labs ordered are listed, but only abnormal results are displayed) Labs Reviewed  LIPASE, BLOOD  - Abnormal; Notable for the following components:      Result Value   Lipase 78 (*)    All other components within normal limits  COMPREHENSIVE METABOLIC PANEL - Abnormal; Notable for the following components:   Sodium 133 (*)    Chloride 85 (*)    Glucose, Bld 169 (*)    BUN 66 (*)    Creatinine, Ser 2.19 (*)    Total Protein 8.6 (*)    GFR, Estimated 22 (*)    Anion gap 18 (*)    All other components within normal limits  CBC - Abnormal; Notable for the following components:   WBC 15.3 (*)    RBC 5.35 (*)    All other components within normal limits  TROPONIN I (HIGH SENSITIVITY) - Abnormal; Notable for the following components:   Troponin I (High Sensitivity) 28 (*)    All other components within normal limits  URINALYSIS, ROUTINE W REFLEX MICROSCOPIC  BASIC METABOLIC PANEL  CBC  TROPONIN I (HIGH SENSITIVITY)  EKG  ED ECG REPORT I, Dionne Bucy, the attending physician, personally viewed and interpreted this ECG.  Date: 08/10/2022 EKG Time: 1542 Rate: 88 Rhythm: normal sinus rhythm QRS Axis: normal Intervals: normal ST/T Wave abnormalities: normal Narrative Interpretation: no evidence of acute ischemia; no significant change when compared to EKG of 07/16/2022    RADIOLOGY  CT abdomen/pelvis: I independently viewed and interpreted the images; there are no dilated bowel loops or any free air or free fluid.  Radiology report indicates no acute findings.   PROCEDURES:  Critical Care performed: No  Procedures   MEDICATIONS ORDERED IN ED: Medications  acetaminophen (TYLENOL) tablet 650 mg (has no administration in time range)    Or  acetaminophen (TYLENOL) suppository 650 mg (has no administration in time range)  ondansetron (ZOFRAN) tablet 4 mg (has no administration in time range)    Or  ondansetron (ZOFRAN) injection 4 mg (has no administration in time range)  heparin injection 5,000 Units (has no administration in time range)  senna-docusate  (Senokot-S) tablet 1 tablet (has no administration in time range)  0.9 %  sodium chloride infusion (has no administration in time range)  sodium chloride 0.9 % bolus 500 mL (500 mLs Intravenous New Bag/Given 08/10/22 1736)  ondansetron (ZOFRAN) injection 4 mg (4 mg Intravenous Given 08/10/22 1736)     IMPRESSION / MDM / ASSESSMENT AND PLAN / ED COURSE  I reviewed the triage vital signs and the nursing notes.  82 year old female with PMH as noted above presents with acute onset of vomiting, diarrhea, and abdominal pain today.  On exam her vital signs are normal.  Abdomen is soft but she has mild diffuse tenderness.  Differential diagnosis includes, but is not limited to, gastroenteritis, colitis, foodborne illness, diverticulitis, less likely hepatobiliary etiology, SBO, volvulus.  Patient's presentation is most consistent with acute presentation with potential threat to life or bodily function.  Initial lab workup reveals leukocytosis.  The patient also has an AKI.  Lipase and troponin are minimally elevated.  LFTs are normal.  Given AKI and elevated anion gap I will give a small fluid bolus keeping the patient's CHF in mind.  I have ordered her Zofran and a CT for further evaluation.  ----------------------------------------- 6:14 PM on 08/10/2022 -----------------------------------------  CT shows no acute findings.  Overall suspect gastroenteritis or foodborne illness but given the dehydration and AKI I consulted Dr. Sedalia Muta from the hospitalist service.  Based on our discussion she agrees to evaluate the patient for admission.  FINAL CLINICAL IMPRESSION(S) / ED DIAGNOSES   Final diagnoses:  Nausea vomiting and diarrhea  AKI (acute kidney injury) (HCC)     Rx / DC Orders   ED Discharge Orders     None        Note:  This document was prepared using Dragon voice recognition software and may include unintentional dictation errors.    Dionne Bucy, MD 08/10/22 1815

## 2022-08-10 NOTE — H&P (Addendum)
History and Physical   Kerri Carter ZOX:[id number] DOB: [birth year] DOA: 08/10/2022  PCP: Dana Allan, MD Patient coming from: Home  I have personally briefly reviewed patient's old medical records in [facility] EMR.  Chief Concern: Nausea, vomiting, abdominal pain  HPI: Kerri Carter is a 82 year old female with history of depression, hypertension, hyperlipidemia, heart failure preserved ejection fraction, COPD, paroxysmal atrial fibrillation on Eliquis, history of left renal artery aneurysm, history of right nephrectomy, who presents emergency department for chief concerns of nausea, vomiting, abdominal pain with loose stools.  Vitals in the ED showed temperature 98, respiration rate 19, heart rate 85, blood pressure 112/61, SpO2 97% on room air.  Serum sodium is 133, potassium 3.5, chloride 85, bicarb 30, BUN of 66, serum creatinine of 2.19, EGFR 22, nonfasting blood glucose 169, WBC 15.3, hemoglobin 14.6, platelets of 277.  Lipase was 78.  High sensitive troponin was 28 and on repeat was 24.  CT abdomen pelvis wo contrast: Was read as no acute abnormality or explanation for abdominal pain.  Prior right nephrectomy.  Fat-containing upper abdominal ventral abdominal wall hernia.  Unchanged 7 mm left renal artery aneurysm.  This needs no further follow-up.  Left ovarian cyst, 2.5 cm that is smaller than on prior exam when it was measured at 3 cm.  Consensus guidelines recommend no follow-up imaging for a cyst the size.  ED treatment: Ondansetron 4 mg IV one-time dose, sodium chloride 500 mL bolus. ------------------------------ At bedside, patient was able to tell me her name, age, location, current currently year.  She reports that on day of admission, she started having nausea, vomiting, multiple episodes, and diarrhea.  She denies blood in her stool and blood in her vomitus.  She denies coffee-ground emesis and black stool.  She denies known sick contacts.  She does live at home with  her grandson and his wife and their 2 children who are 92 and 8 years old.  She does not think that the 2 young children were sick recently.  She denies changes to her diet.  She denies recent antibiotic use.  She reports the abdominal pain has improved with Tylenol and tramadol.  She reports she had 2 episodes of diarrhea prior to ED presentation and the diarrhea has now resolved.  She reports she has been having difficulty keeping food down at home, and with the abdominal pain, this prompted her to present to the emergency department.  Social history: She is at home with her grandson and his wife and 2 children.  She is a former tobacco user.  She denies EtOH and recreational drug use.  She is retired.  ROS: Constitutional: no weight change, no fever ENT/Mouth: no sore throat, no rhinorrhea Eyes: no eye pain, no vision changes Cardiovascular: no chest pain, no dyspnea,  no edema, no palpitations Respiratory: no cough, no sputum, no wheezing Gastrointestinal: + nausea, + vomiting, + diarrhea, no constipation Genitourinary: no urinary incontinence, no dysuria, no hematuria Musculoskeletal: no arthralgias, no myalgias Skin: no skin lesions, no pruritus, Neuro: + weakness, no loss of consciousness, no syncope Psych: no anxiety, no depression, no decrease appetite Heme/Lymph: no bruising, no bleeding  ED Course: Discussed with emergency medicine provider, patient requiring hospitalization for chief concerns of intractable nausea and vomiting with acute kidney injury.  Assessment/Plan  Principal Problem:   AKI (acute kidney injury) (HCC) Active Problems:   Intractable nausea and vomiting   Essential hypertension   Interstitial pulmonary disease (HCC)   Paroxysmal atrial fibrillation (  HCC)   Chronic heart failure with preserved ejection fraction (HCC)   Rheumatoid arthritis (HCC)   Hyperlipidemia   Gastroesophageal reflux disease   Leukocytosis   PAD (peripheral artery disease) (HCC)    Chronic pain   Coronary atherosclerosis   Chronic kidney disease, stage 3a (HCC)   Iron deficiency anemia due to chronic blood loss   Gastroenteritis   Assessment and Plan:  * AKI (acute kidney injury) (HCC) Presumed secondary to prerenal in setting of intractable nausea and vomiting at home As needed ondansetron ordered Status post sodium chloride 500 mL bolus per EDP Continue with sodium chloride at 100 mL/h, 1 day ordered Recheck BMP in the a.m. If patient's renal function does not improve significantly, a.m. team to consider consultation to nephrology  Intractable nausea and vomiting With diarrhea, suspect gastroenteritis Improved with ondansetron We will continue with ondansetron 4 mg p.o./IV every 6 hours as needed for nausea and vomiting, 5 days ordered Phenergan 12.5 mg IV every 8 hours as needed for refractory nausea and vomiting, 1 dose ordered  Paroxysmal atrial fibrillation (HCC) Home Eliquis 5 mg p.o. twice daily resumed on admission  Essential hypertension Home amlodipine 5 mg daily, nebivolol 5 mg per home dosing resumed Hydralazine 5 mg IV every 8 hours as needed for SBP greater than 170, 4 days ordered  Chronic heart failure with preserved ejection fraction (HCC) Not in acute exacerbation at this time  Rheumatoid arthritis (HCC) Leflunomide 20 mg daily resumed  Gastroesophageal reflux disease Home PPI resumed  Hyperlipidemia Home pravastatin 20 mg daily resumed  Gastroenteritis Suspect viral Symptomatic support  Chronic pain Home tramadol resumed  Chart reviewed.   DVT prophylaxis: Home Eliquis Code Status: DNR, confirmed with patient at bedside and MOST form and epic media Diet: N.p.o.; advance as tolerated to full liquid Family Communication: A phone call was offered, patient declined stating that her son and her grandson already knows she is in the hospital Disposition Plan: Pending clinical course Consults called: None at this  time Admission status: Telemetry cardiac, observation  Past Medical History:  Diagnosis Date   Acromial process of scapula fracture 12/21/2019   Acute kidney injury superimposed on chronic kidney disease (HCC) 05/26/2021   Acute on chronic combined systolic (congestive) and diastolic (congestive) heart failure (HCC) 07/28/2021   Acute on chronic combined systolic and diastolic CHF (congestive heart failure) (HCC) 05/26/2021   Acute on chronic respiratory failure with hypoxia (HCC) 09/10/2021   Acute pain of left knee 12/18/2020   Anemia in stage 3a chronic kidney disease (HCC) 10/09/2021   Anxiety    Anxiety 01/20/2019   Anxiety and depression 05/26/2021   Aortic stenosis 07/09/2015   a.) TTE 07/06/2015: EF 55-60%; mild AS with MPG 13.0 mmHg.   Arrhythmia    atrial fibrillation   Assistance needed for mobility 12/18/2020   Asthma    At high risk for falls 03/16/2019   Avascular necrosis of left shoulder due to adverse effect of steroid therapy (HCC) 01/20/2018   Bipolar disorder (HCC)    C. difficile diarrhea 2010   a.) following ABX course during hospital admission   Carotid atherosclerosis, bilateral    a.) Moderate; < 50% stenosis BILATERAL ICAs.   Cataract    a.) s/p BILATERAL extraction   CHF (congestive heart failure) (HCC)    Chicken pox    Chronic left shoulder pain 08/29/2019   Chronic respiratory failure with hypoxia (HCC) 10/01/2021   CKD (chronic kidney disease), stage III (HCC)  a. s/p R nephrectomy./ aneurysm   Closed fracture of acromial process of scapula 10/28/2018   Closed nondisplaced fracture of proximal phalanx of lesser toe of right foot    Collapsed vertebra, not elsewhere classified, lumbar region, initial encounter for fracture (HCC) 02/21/2019   Community acquired pneumonia 07/22/2021   Complicated grief 03/16/2019   Compression fracture of L4 vertebra (HCC) 02/21/2019   Conversion disorder    Conversion disorder 08/04/2015   COPD (chronic  obstructive pulmonary disease) (HCC)    COPD with exacerbation (HCC) 08/20/2021   Cough 05/02/2012   Depression    Dislocation of hip joint prosthesis (HCC) 01/08/2017   Elevated brain natriuretic peptide (BNP) level 12/18/2020   Elevated d-dimer 05/26/2021   Episodic mood disorder (HCC) 04/18/2019   Essential hypertension    Fatigue 07/07/2018   Frequent falls 12/18/2020   GERD (gastroesophageal reflux disease)    H/O cardiac catheterization 02/24/2011   Formatting of this note might be different from the original.  Repeated at [facility] 2/13 and insignificant disease   Hav (hallux abducto valgus), unspecified laterality 12/05/2018   Heart murmur    Hip fracture (HCC) 10/31/2016   Hip pain 12/18/2020   History of fracture of left hip 01/15/2017   History of syncope 08/25/2016   Hyperkalemia 05/26/2021   - The patient was given p.o. Lokelma and IV Lasix.  - We will follow potassium level.   Hyperlipidemia    Hypokalemia 08/25/2016   ILD (interstitial lung disease) (HCC)    mild; 2/2 RA diagnosis   Iliopsoas bursitis of right hip 08/29/2019   Inflammatory arthritis    a. hands/carpal tunnel.  b. Low titer rheumatoid factor. c. Negative anti-CCP antibodies. d. Plaquenil.   Insomnia 07/17/2015   Intractable nausea and vomiting 08/10/2022   Left foot pain 10/19/2016   Left lower lobe pneumonia 05/26/2021   Leg edema 05/02/2019   Leg pain, bilateral    Leg swelling 12/15/2016   Leukocytosis 10/19/2017   Leukopenia 06/24/2021   Lymphocytosis 07/13/2021   Macular degeneration    Microcytic anemia 08/15/2015   Mixed bipolar I disorder (HCC) 10/09/2015   Nocturnal hypoxemia    Nocturnal hypoxemia 09/09/2011   June 2013 overnight oximetry on 6 centimeters of water CPAP: SpO2 less than 88% 72% of the time   Non-Obstructive CAD    a. 07/2009 Cath ([facility]): nonobs dzs;  b. 03/2011 Cath [facility]): nonobs dzs.   NSIP (nonspecific interstitial pneumonitis) (HCC) 12/08/2019   Osteoarthritis     a. Knees.   Other shoulder lesions, left shoulder 12/20/2017   Other specified postprocedural states 07/20/2013   Overweight (BMI 25.0-29.9) 08/29/2019   PAD (peripheral artery disease) (HCC)    Pain due to onychomycosis of toenails of both feet 12/05/2018   Pelvic mass in female 03/16/2019   02/21/19 CT pelvis w/o contrast  Cystic left adnexal lesion has enlarged since 2018 where it  measured approximately 1.8 x 1.7 cm. This is not well assessed due  to streak artifact as well as lack of contrast. Ultrasound follow-up  may be helpful in 8-12 weeks      04/05/19 TVUS     IMPRESSION:  Simple appearing cystic structure within the left ovary measuring up  to 2.9 cm. While this has enlarged s   Physical deconditioning 02/27/2021   Pleural effusion on left 12/18/2020   PUD (peptic ulcer disease)    Recurrent falls 09/01/2021   Recurrent pneumonia 07/23/2021   Repeated falls 12/18/2020   Respiratory failure with hypoxia (  HCC) 01/12/2021   Rotator cuff tendinitis, right 07/26/2017   S/P lumbar fusion 05/23/2021   S/P right hip fracture    11/01/16 s/p repair   Sepsis due to pneumonia (HCC) 07/22/2021   Shoulder pain    Sleep apnea    no cpap / minimal   SOB (shortness of breath) 10/13/2011   2011 [facility] eval >> LHC (non-obstructive CAD), RHC (wedge 13, mean PA 26), TTE (LVEF > 55%, mild concentric LVH, mild AR, MR, PR, TR), CTAngio chest unrevealing; "well controlled asthma"  02/2011 TTE [facility]>> NL LVEF, Mild LVH, Mod AR/Mod MR   Spinal stenosis at L4-L5 level    severe with L4/L5 anterolisthesis grade 1 anterolisthesis    Spinal stenosis of lumbar region 09/15/2016   Last Assessment & Plan:   Now post op with continued back and leg pain noted. Is working with pt/ot and surgery was said to have no complications. Bowels are moving and general pain and anxiety is basically baseline.    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   Weakness of both lower extremities 12/18/2020   Past Surgical History:  Procedure Laterality Date   APPENDECTOMY     APPLICATION OF INTRAOPERATIVE CT SCAN N/A 05/23/2021   Procedure: APPLICATION OF INTRAOPERATIVE CT SCAN;  Surgeon: Venetia Night, MD;  Location: [facility];  Service: Neurosurgery;  Laterality: N/A;   BACK SURGERY     lumbar fusion   BUNIONECTOMY Right    CATARACT EXTRACTION, BILATERAL     CESAREAN SECTION     x1   CHOLECYSTECTOMY N/A 05/11/2016   Procedure: LAPAROSCOPIC CHOLECYSTECTOMY;  Surgeon: Lattie Haw, MD;  Location: [facility];  Service: General;  Laterality: N/A;   COLONOSCOPY WITH PROPOFOL N/A 04/02/2016   Procedure: COLONOSCOPY WITH PROPOFOL;  Surgeon: Wyline Mood, MD;  Location: [facility];  Service: Endoscopy;  Laterality: N/A;   ENDOSCOPIC RETROGRADE CHOLANGIOPANCREATOGRAPHY (ERCP) WITH PROPOFOL N/A 05/08/2016   Procedure: ENDOSCOPIC RETROGRADE CHOLANGIOPANCREATOGRAPHY (ERCP) WITH PROPOFOL;  Surgeon: Midge Minium, MD;  Location: [facility];  Service: Endoscopy;  Laterality: N/A;   ERCP     with biliary spincterotomy 05/08/16 Dr. Servando Snare for choledocholithiasis    ESOPHAGEAL DILATION  04/02/2016   Procedure: ESOPHAGEAL DILATION;  Surgeon: Wyline Mood, MD;  Location: [facility];  Service: Endoscopy;;   ESOPHAGOGASTRODUODENOSCOPY (EGD) WITH PROPOFOL N/A 04/02/2016   Procedure: ESOPHAGOGASTRODUODENOSCOPY (EGD) WITH PROPOFOL;  Surgeon: Wyline Mood, MD;  Location: [facility];  Service:  Endoscopy;  Laterality: N/A;   HIP ARTHROPLASTY Right 11/01/2016   Procedure: ARTHROPLASTY BIPOLAR HIP (HEMIARTHROPLASTY);  Surgeon: Christena Flake, MD;  Location: [facility];  Service: Orthopedics;  Laterality: Right;   LUMBAR LAMINECTOMY/DECOMPRESSION MICRODISCECTOMY N/A 12/11/2020   Procedure: LEFT L2-3 MICRODISCECTOMY, L3-4 AND L5-S1 DECOMPRESSION;  Surgeon: Venetia Night, MD;  Location: [facility];  Service: Neurosurgery;  Laterality: N/A;   NEPHRECTOMY  1988   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   ORIF SCAPHOID FRACTURE Left 12/21/2019  Procedure: OPEN REDUCTION INTERNAL FIXATION (ORIF) OF LEFT SCAPULAR NONUNION WITH BONE GRAFT;  Surgeon: Christena Flake, MD;  Location: [facility];  Service: Orthopedics;  Laterality: Left;   osteoporosis     noted DEXA 08/19/16    REPLACEMENT TOTAL KNEE Right    REVERSE SHOULDER ARTHROPLASTY Right 11/04/2017   Procedure: REVERSE SHOULDER ARTHROPLASTY;  Surgeon: Christena Flake, MD;  Location: [facility];  Service: Orthopedics;  Laterality: Right;   REVERSE SHOULDER ARTHROPLASTY Left 07/26/2018   Procedure: REVERSE SHOULDER ARTHROPLASTY;  Surgeon: Christena Flake, MD;  Location: [facility];  Service: Orthopedics;  Laterality: Left;   TONSILLECTOMY     TOTAL HIP ARTHROPLASTY  12/10/11   [facility] left hip   TOTAL HIP ARTHROPLASTY Bilateral    TRANSFORAMINAL LUMBAR INTERBODY FUSION (TLIF) WITH PEDICLE SCREW FIXATION 3 LEVEL N/A 05/23/2021   Procedure: OPEN L2-4 TRANSFORAMINAL LUMBAR INTERBODY FUSION (TLIF) WITH L2-5 POSTERIOR SPINAL FUSION;  Surgeon: Venetia Night, MD;  Location: [facility];  Service: Neurosurgery;  Laterality: N/A;   TUBAL LIGATION     Social History:  reports that she quit smoking about 48 years ago. Her smoking use included cigarettes. She has a 10.00 pack-year smoking history. She has never used smokeless tobacco. She reports that she does not drink alcohol and does not use drugs.  Allergies  Allergen Reactions   Ceftin  [Cefuroxime Axetil] Anaphylaxis   Lisinopril Anaphylaxis   Sulfa Antibiotics Other (See Comments)    Face swelling   Sulfasalazine Anaphylaxis   Morphine Other (See Comments)    Per patient, low blood pressure issues that requires action to raise it back up. Can take small infrequent doses   Xarelto [Rivaroxaban] Other (See Comments)    Stomach burning, bleeding, and tar in stool   Adhesive [Tape] Rash    Paper tape and tega derm OK   Antihistamines, Chlorpheniramine-Type Other (See Comments)    Makes pt hyper   Antivert [Meclizine Hcl] Other (See Comments)    Bladder will not empty   Aspirin Other (See Comments)    Sulfasalazine allergy cross reacts   Contrast Media [Iodinated Contrast Media] Rash    she is able to use betadine scrubs.   Decongestant [Pseudoephedrine Hcl] Other (See Comments)    Makes pt hyper   Doxycycline Other (See Comments)    GI upset   Levaquin [Levofloxacin In D5w] Rash   Polymyxin B Other (See Comments)    Facial rash   Tetanus Toxoids Rash and Other (See Comments)    Fever and hot to touch at injection site   Family History  Problem Relation Age of Onset   Rheum arthritis Mother    Asthma Mother    Parkinson's disease Mother    Heart disease Mother    Stroke Mother    Hypertension Mother    Heart attack Father    Heart disease Father    Hypertension Father    Peripheral Artery Disease Father    Diabetes Son    Gout Son    Asthma Sister    Heart disease Sister    Lung cancer Sister    Heart disease Sister    Heart disease Sister    Breast cancer Sister    Heart attack Sister    Heart disease Brother    Heart disease Maternal Grandmother    Diabetes Maternal Grandmother    Colon cancer Maternal Grandmother    Cancer Maternal Grandmother        Hodgkins lymphoma   Heart  disease Brother    Alcohol abuse Brother    Depression Brother    Dementia Son    Family history: Family history reviewed and not pertinent  Prior to Admission  medications   Medication Sig Start Date End Date Taking? Authorizing Provider  budesonide (PULMICORT) 0.5 MG/2ML nebulizer solution Take 2 mLs by nebulization 2 (two) times daily. 07/28/22  Yes [provider]  albuterol (PROVENTIL) (2.5 MG/3ML) 0.083% nebulizer solution Take by nebulization. Patient not taking: Reported on 08/05/2022 12/08/21   [provider]  albuterol (VENTOLIN HFA) 108 (90 Base) MCG/ACT inhaler Inhale 2 puffs into the lungs every 6 (six) hours as needed for wheezing or shortness of breath. 06/24/21   McLean-Scocuzza, Pasty Spillers, MD  amLODipine (NORVASC) 5 MG tablet Take 1 tablet (5 mg total) by mouth daily. 08/01/21   McLean-Scocuzza, Pasty Spillers, MD  apixaban (ELIQUIS) 5 MG TABS tablet Take 1 tablet (5 mg total) by mouth 2 (two) times daily. 10/15/21   McLean-Scocuzza, Pasty Spillers, MD  busPIRone (BUSPAR) 5 MG tablet Take 1 tablet (5 mg total) by mouth 2 (two) times daily. 06/02/21   Lurene Shadow, MD  cetirizine (ZYRTEC) 10 MG tablet TAKE 1 TABLET BY MOUTH EVERYDAY AT BEDTIME 06/08/22   Salena Saner, MD  dicyclomine (BENTYL) 10 MG capsule Take 1 capsule (10 mg total) by mouth 3 (three) times daily before meals. 07/19/22 08/18/22  Tresa Moore, MD  ENBREL SURECLICK 50 MG/ML injection Inject 50 mg into the skin once a week. Takes on Fridays    [provider]  escitalopram (LEXAPRO) 10 MG tablet Take 1 tablet (10 mg total) by mouth daily. 08/01/21   McLean-Scocuzza, Pasty Spillers, MD  Fluticasone-Umeclidin-Vilant (TRELEGY ELLIPTA) 100-62.5-25 MCG/ACT AEPB Inhale 1 puff into the lungs daily. 07/20/22   Salena Saner, MD  gabapentin (NEURONTIN) 300 MG capsule Take 300 mg in the morning and 600 mg at night. 01/06/22   Dana Allan, MD  ipratropium-albuterol (DUONEB) 0.5-2.5 (3) MG/3ML SOLN SMARTSIG:3 Milliliter(s) Via Nebulizer Every 6 Hours PRN 03/25/22   [provider]  lamoTRIgine (LAMICTAL) 100 MG tablet TAKE 1 TAB 2 TIMES DAILY. FURTHER REFILLS NEW PSYCH  FOR ALL PSYCH MEDS ONLY TEMP SUPPLY FROM PCP 05/02/21   McLean-Scocuzza, Pasty Spillers, MD  leflunomide (ARAVA) 20 MG tablet Take 1 tablet (20 mg total) by mouth daily. 09/30/17   McLean-Scocuzza, Pasty Spillers, MD  lidocaine (LIDODERM) 5 % Place 1 patch onto the skin 2 (two) times daily as needed. Remove & Discard patch within 12 hours or as directed by MD 11/22/20   McLean-Scocuzza, Pasty Spillers, MD  lovastatin (MEVACOR) 20 MG tablet Take 1 tablet (20 mg total) by mouth daily at 6 PM. 08/04/22   Dana Allan, MD  methocarbamol (ROBAXIN) 500 MG tablet Take 500 mg by mouth 4 (four) times daily as needed for muscle spasms. Taking twice a day currently 01/23/21   [provider]  metolazone (ZAROXOLYN) 2.5 MG tablet Take 1 tablet (2.5 mg total) by mouth daily. On Thursday and again on Saturday. 08/05/22   Delma Freeze, FNP  mometasone (NASONEX) 50 MCG/ACT nasal spray 1 spray to each nostril twice a day 02/26/22   Salena Saner, MD  montelukast (SINGULAIR) 10 MG tablet Take 1 tablet (10 mg total) by mouth daily. 07/20/22 07/20/23  Salena Saner, MD  multivitamin-lutein [facility]) CAPS capsule Take 1 capsule by mouth at bedtime.    [provider]  nebivolol (BYSTOLIC) 5 MG tablet Take 1  tablet (5 mg total) by mouth in the morning and at bedtime. (Note dose changed from 1/2 10 mg bid to 5 mg bid) 08/01/21   McLean-Scocuzza, Pasty Spillers, MD  OXYGEN Inhale 3 L into the lungs continuous.    [provider]  pantoprazole (PROTONIX) 40 MG tablet TAKE 1 TABLET BY MOUTH 2 TIMES DAILY 30 MIN BEFORE FOOD (NOTE REDUCTION IN FREQUENCY) 07/29/22   Dana Allan, MD  potassium chloride SA (KLOR-CON M) 20 MEQ tablet Take 2 tablets (40 mEq total) by mouth daily. 08/05/22   Delma Freeze, FNP  predniSONE (DELTASONE) 5 MG tablet TAKE 1 TABLET BY MOUTH EVERY DAY WITH BREAKFAST 02/20/22   Salena Saner, MD  PROLIA 60 MG/ML SOSY injection Inject 60 mg into the skin every 6 (six) months. 03/09/22   [provider]  QUEtiapine (SEROQUEL) 25 MG tablet TAKE 1 TABLET (25 MG TOTAL) BY MOUTH AT BEDTIME. AGAIN LAST FILL FURTHER REFILLS FROM PSYCHIATRY NO EXCEPTIONS 09/13/20   McLean-Scocuzza, Pasty Spillers, MD  sucralfate (CARAFATE) 1 g tablet TAKE 1 TABLET BY MOUTH 2 TIMES DAILY. 11/12/21   Wyline Mood, MD  torsemide (DEMADEX) 20 MG tablet Take 2 tablets (40 mg total) by mouth daily. 07/19/22 08/18/22  Tresa Moore, MD  traMADol (ULTRAM) 50 MG tablet Take 1 tablet (50 mg total) by mouth every 12 (twelve) hours as needed. 07/28/22   Edward Jolly, MD  Vibegron (GEMTESA) 75 MG TABS Take 75 mg by mouth daily. 01/08/22   Carman Ching, PA-C   Physical Exam: Vitals:   08/10/22 1519 08/10/22 1915 08/10/22 2123 08/10/22 2301  BP: 112/61 138/60 (!) 154/67 (!) 109/55  Pulse: 85 68 74 77  Resp: 19 18 20 18   Temp: 98 F (36.7 C) 98.1 F (36.7 C) 98.1 F (36.7 C) 98.1 F (36.7 C)  TempSrc:  Oral Oral   SpO2: 97% 96% 98% 93%   Constitutional: appears age-appropriate, frail, NAD, calm Eyes: PERRL, lids and conjunctivae normal ENMT: Mucous membranes are dry. Posterior pharynx clear of any exudate or lesions. Age-appropriate dentition. Hearing appropriate Neck: normal, supple, no masses, no thyromegaly Respiratory: clear to auscultation bilaterally, no wheezing, no crackles. Normal respiratory effort. No accessory muscle use.  Cardiovascular: Regular rate and rhythm, no murmurs / rubs / gallops. No extremity edema. 2+ pedal pulses. No carotid bruits.  Abdomen: no tenderness, no masses palpated, no hepatosplenomegaly. Bowel sounds positive.  Musculoskeletal: no clubbing / cyanosis. No joint deformity upper and lower extremities. Good ROM, no contractures, no atrophy. Normal muscle tone.  Skin: no rashes, lesions, ulcers. No induration Neurologic: Sensation intact. Strength 5/5 in all 4.  Psychiatric: Normal judgment and insight. Alert and oriented x 3. Normal mood.   EKG: independently reviewed,  showing sinus rhythm with rate of 88, QTc 452  Chest x-ray on Admission: I personally reviewed and I agree with radiologist reading as below.  CT ABDOMEN PELVIS WO CONTRAST  Result Date: 08/10/2022 CLINICAL DATA:  Abdominal pain, acute, nonlocalized Nausea and vomiting. EXAM: CT ABDOMEN AND PELVIS WITHOUT CONTRAST TECHNIQUE: Multidetector CT imaging of the abdomen and pelvis was performed following the standard protocol without IV contrast. RADIATION DOSE REDUCTION: This exam was performed according to the departmental dose-optimization program which includes automated exposure control, adjustment of the mA and/or kV according to patient size and/or use of iterative reconstruction technique. COMPARISON:  02/10/2021 FINDINGS: Lower chest: No acute findings. Minimal hiatal hernia. Again seen mitral annulus calcifications. Hepatobiliary: Unremarkable unenhanced appearance of the liver. Cholecystectomy.  No biliary dilatation. Pancreas: No ductal dilatation or inflammation. Spleen: Normal in size without focal abnormality. Adrenals/Urinary Tract: Normal adrenal glands. Prior right nephrectomy. No left hydronephrosis. No renal calculi. Subcentimeter low-density left renal lesions are too small to characterize. No specific imaging follow-up is needed. 7 mm renal artery aneurysm, unchanged. Unremarkable urinary bladder. Stomach/Bowel: Minimal hiatal hernia. No gastric wall thickening. Duodenal diverticulum. No small bowel obstruction or inflammation. Appendectomy. Small volume of stool in the colon. No colonic inflammation. Vascular/Lymphatic: Aortic atherosclerosis without aneurysm. 7 mm left renal artery aneurysm no abdominopelvic adenopathy. Reproductive: The uterus is poorly defined. There is a 2.5 cm left ovarian cyst that is simple appearing, smaller than on prior exam when it measured 3 cm. Other: No ascites. No free air. There is a fat containing upper abdominal ventral abdominal wall hernia. Musculoskeletal:  L2 through L5 fusion hardware. T10 inferior endplate compression deformity is stable in appearance from chest CT last month. Bilateral hip arthroplasties. IMPRESSION: 1. No acute abnormality or explanation for abdominal pain. 2. Prior right nephrectomy. 3. Fat containing upper abdominal ventral abdominal wall hernia. 4. Unchanged 7 mm left renal artery aneurysm. This needs no further follow-up. 5. A 2.5 cm left ovarian cyst is smaller than on prior exam when it measured 3 cm. This is likely benign. Consensus guidelines recommend no follow-up imaging for a cyst of this size. Aortic Atherosclerosis (ICD10-I70.0). Electronically Signed   By: Narda Rutherford M.D.   On: 08/10/2022 17:29    Labs on Admission: I have personally reviewed following labs  CBC: Recent Labs  Lab 08/10/22 1519  WBC 15.3*  HGB 14.6  HCT 45.3  MCV 84.7  PLT 277   Basic Metabolic Panel: Recent Labs  Lab 08/10/22 1519  NA 133*  K 3.5  CL 85*  CO2 30  GLUCOSE 169*  BUN 66*  CREATININE 2.19*  CALCIUM 9.8   GFR: Estimated Creatinine Clearance: 18.8 mL/min (A) (by C-G formula based on SCr of 2.19 mg/dL (H)).  Liver Function Tests: Recent Labs  Lab 08/10/22 1519  AST 28  ALT 18  ALKPHOS 96  BILITOT 0.9  PROT 8.6*  ALBUMIN 4.5   Recent Labs  Lab 08/10/22 1519  LIPASE 78*   Urine analysis:    Component Value Date/Time   COLORURINE YELLOW (A) 09/08/2021 0726   APPEARANCEUR CLEAR (A) 09/08/2021 0726   APPEARANCEUR Clear 10/12/2017 1410   LABSPEC 1.013 09/08/2021 0726   LABSPEC 1.003 11/24/2013 2117   PHURINE 5.0 09/08/2021 0726   GLUCOSEU NEGATIVE 09/08/2021 0726   GLUCOSEU Negative 11/24/2013 2117   HGBUR NEGATIVE 09/08/2021 0726   BILIRUBINUR NEGATIVE 09/08/2021 0726   BILIRUBINUR Negative 10/12/2017 1410   BILIRUBINUR Negative 11/24/2013 2117   KETONESUR NEGATIVE 09/08/2021 0726   PROTEINUR NEGATIVE 09/08/2021 0726   NITRITE NEGATIVE 09/08/2021 0726   LEUKOCYTESUR NEGATIVE 09/08/2021 0726    LEUKOCYTESUR Negative 11/24/2013 2117   This document was prepared using Dragon Voice Recognition software and may include unintentional dictation errors.  Dr. Sedalia Muta [facility]  If 7PM-7AM, please contact overnight-coverage provider If 7AM-7PM, please contact day attending provider [email or web address]  08/10/2022, 11:50 PM

## 2022-08-10 NOTE — Assessment & Plan Note (Addendum)
Presumed secondary to prerenal in setting of intractable nausea and vomiting at home As needed ondansetron ordered Status post sodium chloride 500 mL bolus per EDP Continue with sodium chloride at 100 mL/h, 1 day ordered Recheck BMP in the a.m. If patient's renal function does not improve significantly, a.m. team to consider consultation to nephrology

## 2022-08-10 NOTE — ED Notes (Signed)
Patient expressed concern about her DNR form. Stated that when she was here before, her DNR was left by mistake. She called here once she arrived home and was told they would mail it to her but she has not received it. Patient would like a new DNR form.

## 2022-08-10 NOTE — ED Notes (Signed)
See triage notes. Patient stated that she has experienced chest pain, abdominal cramping and vomited brown colored and bitter tasting emesis. Patient does not have her gallbladder.

## 2022-08-10 NOTE — Hospital Course (Addendum)
[name] is a 82 year old female with history of depression, hypertension, hyperlipidemia, heart failure preserved ejection fraction, COPD, paroxysmal atrial fibrillation on Eliquis, history of left renal artery aneurysm, history of right nephrectomy, who presents emergency department for chief concerns of nausea, vomiting, abdominal pain with loose stools.  Vitals in the ED showed temperature 98, respiration rate 19, heart rate 85, blood pressure 112/61, SpO2 97% on room air.  Serum sodium is 133, potassium 3.5, chloride 85, bicarb 30, BUN of 66, serum creatinine of 2.19, EGFR 22, nonfasting blood glucose 169, WBC 15.3, hemoglobin 14.6, platelets of 277.  Lipase was 78.  High sensitive troponin was 28 and on repeat was 24.  CT abdomen pelvis wo contrast: Was read as no acute abnormality or explanation for abdominal pain.  Prior right nephrectomy.  Fat-containing upper abdominal ventral abdominal wall hernia.  Unchanged 7 mm left renal artery aneurysm.  This needs no further follow-up.  Left ovarian cyst, 2.5 cm that is smaller than on prior exam when it was measured at 3 cm.  Consensus guidelines recommend no follow-up imaging for a cyst the size.  ED treatment: Ondansetron 4 mg IV one-time dose, sodium chloride 500 mL bolus.

## 2022-08-10 NOTE — Assessment & Plan Note (Signed)
-   Leflunomide 20 mg daily resumed 

## 2022-08-10 NOTE — Assessment & Plan Note (Signed)
Home Eliquis 5 mg p.o. twice daily resumed on admission

## 2022-08-10 NOTE — Assessment & Plan Note (Signed)
Not in acute exacerbation at this time. ?

## 2022-08-10 NOTE — Assessment & Plan Note (Signed)
-   Home PPI resumed 

## 2022-08-10 NOTE — ED Triage Notes (Signed)
Pt comes with c/o N/V and belly pain that started today. Pt was brought in by EMS. Pt states it was brown and bitter vomit. Pt has gallbladder removed.

## 2022-08-10 NOTE — Assessment & Plan Note (Signed)
Home pravastatin 20 mg daily resumed

## 2022-08-10 NOTE — Assessment & Plan Note (Signed)
Home tramadol resumed

## 2022-08-10 NOTE — Assessment & Plan Note (Addendum)
With diarrhea, suspect gastroenteritis Improved with ondansetron We will continue with ondansetron 4 mg p.o./IV every 6 hours as needed for nausea and vomiting, 5 days ordered Phenergan 12.5 mg IV every 8 hours as needed for refractory nausea and vomiting, 1 dose ordered

## 2022-08-11 ENCOUNTER — Inpatient Hospital Stay: Payer: Medicare PPO | Attending: Oncology

## 2022-08-11 DIAGNOSIS — I2510 Atherosclerotic heart disease of native coronary artery without angina pectoris: Secondary | ICD-10-CM | POA: Diagnosis present

## 2022-08-11 DIAGNOSIS — A0472 Enterocolitis due to Clostridium difficile, not specified as recurrent: Secondary | ICD-10-CM | POA: Diagnosis present

## 2022-08-11 DIAGNOSIS — F419 Anxiety disorder, unspecified: Secondary | ICD-10-CM | POA: Diagnosis present

## 2022-08-11 DIAGNOSIS — D500 Iron deficiency anemia secondary to blood loss (chronic): Secondary | ICD-10-CM | POA: Diagnosis present

## 2022-08-11 DIAGNOSIS — E669 Obesity, unspecified: Secondary | ICD-10-CM | POA: Diagnosis present

## 2022-08-11 DIAGNOSIS — I739 Peripheral vascular disease, unspecified: Secondary | ICD-10-CM | POA: Diagnosis present

## 2022-08-11 DIAGNOSIS — I5042 Chronic combined systolic (congestive) and diastolic (congestive) heart failure: Secondary | ICD-10-CM | POA: Diagnosis present

## 2022-08-11 DIAGNOSIS — J9611 Chronic respiratory failure with hypoxia: Secondary | ICD-10-CM | POA: Diagnosis present

## 2022-08-11 DIAGNOSIS — J8489 Other specified interstitial pulmonary diseases: Secondary | ICD-10-CM | POA: Diagnosis present

## 2022-08-11 DIAGNOSIS — G8929 Other chronic pain: Secondary | ICD-10-CM | POA: Diagnosis present

## 2022-08-11 DIAGNOSIS — M069 Rheumatoid arthritis, unspecified: Secondary | ICD-10-CM | POA: Diagnosis present

## 2022-08-11 DIAGNOSIS — Z66 Do not resuscitate: Secondary | ICD-10-CM | POA: Diagnosis present

## 2022-08-11 DIAGNOSIS — N1831 Chronic kidney disease, stage 3a: Secondary | ICD-10-CM | POA: Diagnosis present

## 2022-08-11 DIAGNOSIS — N179 Acute kidney failure, unspecified: Secondary | ICD-10-CM | POA: Diagnosis present

## 2022-08-11 DIAGNOSIS — E785 Hyperlipidemia, unspecified: Secondary | ICD-10-CM | POA: Diagnosis present

## 2022-08-11 DIAGNOSIS — I480 Paroxysmal atrial fibrillation: Secondary | ICD-10-CM | POA: Diagnosis present

## 2022-08-11 DIAGNOSIS — I130 Hypertensive heart and chronic kidney disease with heart failure and stage 1 through stage 4 chronic kidney disease, or unspecified chronic kidney disease: Secondary | ICD-10-CM | POA: Diagnosis present

## 2022-08-11 DIAGNOSIS — J4489 Other specified chronic obstructive pulmonary disease: Secondary | ICD-10-CM | POA: Diagnosis present

## 2022-08-11 DIAGNOSIS — Z7901 Long term (current) use of anticoagulants: Secondary | ICD-10-CM | POA: Diagnosis not present

## 2022-08-11 DIAGNOSIS — E876 Hypokalemia: Secondary | ICD-10-CM | POA: Diagnosis present

## 2022-08-11 DIAGNOSIS — M810 Age-related osteoporosis without current pathological fracture: Secondary | ICD-10-CM | POA: Diagnosis present

## 2022-08-11 DIAGNOSIS — F319 Bipolar disorder, unspecified: Secondary | ICD-10-CM | POA: Diagnosis present

## 2022-08-11 DIAGNOSIS — K219 Gastro-esophageal reflux disease without esophagitis: Secondary | ICD-10-CM | POA: Diagnosis present

## 2022-08-11 DIAGNOSIS — D631 Anemia in chronic kidney disease: Secondary | ICD-10-CM | POA: Diagnosis present

## 2022-08-11 LAB — CBC
HCT: 38.7 % (ref 36.0–46.0)
Hemoglobin: 12.2 g/dL (ref 12.0–15.0)
MCH: 26.9 pg (ref 26.0–34.0)
MCHC: 31.5 g/dL (ref 30.0–36.0)
MCV: 85.4 fL (ref 80.0–100.0)
Platelets: 214 10*3/uL (ref 150–400)
RBC: 4.53 MIL/uL (ref 3.87–5.11)
RDW: 15.2 % (ref 11.5–15.5)
WBC: 9.9 10*3/uL (ref 4.0–10.5)
nRBC: 0 % (ref 0.0–0.2)

## 2022-08-11 LAB — URINALYSIS, ROUTINE W REFLEX MICROSCOPIC
Bilirubin Urine: NEGATIVE
Glucose, UA: NEGATIVE mg/dL
Hgb urine dipstick: NEGATIVE
Ketones, ur: NEGATIVE mg/dL
Leukocytes,Ua: NEGATIVE
Nitrite: NEGATIVE
Protein, ur: NEGATIVE mg/dL
Specific Gravity, Urine: 1.013 (ref 1.005–1.030)
pH: 6 (ref 5.0–8.0)

## 2022-08-11 LAB — BASIC METABOLIC PANEL
Anion gap: 13 (ref 5–15)
BUN: 55 mg/dL — ABNORMAL HIGH (ref 8–23)
CO2: 29 mmol/L (ref 22–32)
Calcium: 8.5 mg/dL — ABNORMAL LOW (ref 8.9–10.3)
Chloride: 93 mmol/L — ABNORMAL LOW (ref 98–111)
Creatinine, Ser: 1.78 mg/dL — ABNORMAL HIGH (ref 0.44–1.00)
GFR, Estimated: 29 mL/min — ABNORMAL LOW (ref >60)
Glucose, Bld: 102 mg/dL — ABNORMAL HIGH (ref 70–99)
Potassium: 2.6 mmol/L — CL (ref 3.5–5.1)
Sodium: 135 mmol/L (ref 135–145)

## 2022-08-11 LAB — POTASSIUM: Potassium: 4.1 mmol/L (ref 3.5–5.1)

## 2022-08-11 MED ORDER — GABAPENTIN 300 MG PO CAPS
300.0000 mg | ORAL_CAPSULE | Freq: Every day | ORAL | Status: DC
  Administered 2022-08-11 – 2022-08-14 (×4): 300 mg via ORAL
  Filled 2022-08-11 (×4): qty 1

## 2022-08-11 MED ORDER — POTASSIUM CHLORIDE 10 MEQ/100ML IV SOLN
10.0000 meq | INTRAVENOUS | Status: AC
  Administered 2022-08-11 (×3): 10 meq via INTRAVENOUS
  Filled 2022-08-11 (×3): qty 100

## 2022-08-11 MED ORDER — GABAPENTIN 300 MG PO CAPS
600.0000 mg | ORAL_CAPSULE | Freq: Every day | ORAL | Status: DC
  Administered 2022-08-11 – 2022-08-13 (×4): 600 mg via ORAL
  Filled 2022-08-11 (×4): qty 2

## 2022-08-11 MED ORDER — APIXABAN 5 MG PO TABS
5.0000 mg | ORAL_TABLET | Freq: Two times a day (BID) | ORAL | Status: DC
  Administered 2022-08-11 – 2022-08-14 (×7): 5 mg via ORAL
  Filled 2022-08-11 (×7): qty 1

## 2022-08-11 MED ORDER — POTASSIUM CHLORIDE CRYS ER 20 MEQ PO TBCR
40.0000 meq | EXTENDED_RELEASE_TABLET | ORAL | Status: DC
  Administered 2022-08-11 (×2): 40 meq via ORAL
  Filled 2022-08-11 (×2): qty 2

## 2022-08-11 MED FILL — Iron Sucrose Inj 20 MG/ML (Fe Equiv): INTRAVENOUS | Qty: 10 | Status: AC

## 2022-08-11 NOTE — Progress Notes (Signed)
Progress Note   Patient: Kerri Carter:[id number] DOB: 01/07/1941 DOA: 08/10/2022     0 DOS: the patient was seen and examined on 08/11/2022    Subjective:  Patient seen and examined at bedside this morning Chief complaint of diarrhea Tells me nausea and vomiting improved as well as abdominal pain I requested for GI panel as well as C. Difficile Noticed to have significant hypokalemia Denies cough chest pain shortness of breath   Brief hospital course:  From HPI "[name] is a 82 year old female with history of depression, hypertension, hyperlipidemia, heart failure preserved ejection fraction, COPD, paroxysmal atrial fibrillation on Eliquis, history of left renal artery aneurysm, history of right nephrectomy, who presents emergency department for chief concerns of nausea, vomiting, abdominal pain with loose stools.   Vitals in the ED showed temperature 98, respiration rate 19, heart rate 85, blood pressure 112/61, SpO2 97% on room air.   Serum sodium is 133, potassium 3.5, chloride 85, bicarb 30, BUN of 66, serum creatinine of 2.19, EGFR 22, nonfasting blood glucose 169, WBC 15.3, hemoglobin 14.6, platelets of 277.   Lipase was 78.  High sensitive troponin was 28 and on repeat was 24.   CT abdomen pelvis wo contrast: Did not review acute pathology   Hospitalist service therefore consulted for admission"    Assessment and Plan:  AKI (acute kidney injury) (HCC)-improved Presumed secondary to prerenal in setting of intractable nausea and vomiting at home As needed ondansetron ordered Status post sodium chloride 500 mL bolus per EDP Continue IV fluid resuscitation Monitor renal function closely Avoid nephrotoxic medications Renally dose all drugs Nephrology consult on hold as patient's renal function is improving with IV hydration   Intractable nausea and vomiting-improved With diarrhea, suspect gastroenteritis Improved with ondansetron GI panel as well as C.  difficile requested given persistent diarrhea-we will follow Phenergan 12.5 mg IV every 8 hours as needed for refractory nausea and vomiting    Severe hypokalemia likely secondary to diarrhea and vomiting Potassium today noted to be 2.6 Continue repletion and monitoring  Paroxysmal atrial fibrillation (HCC) Continue Home Eliquis 5 mg p.o. twice daily resumed on admission Rate controlled  Essential hypertension Continue Home amlodipine 5 mg daily, nebivolol 5 mg per home dosing resumed Continue hydralazine 5 mg IV every 8 hours as needed for SBP greater than 170, 4 days ordered   Chronic heart failure with preserved ejection fraction (HCC) Not in acute exacerbation at this time Monitor closely while on IV fluid   Rheumatoid arthritis (HCC) Continue leflunomide 20 mg daily    Gastroesophageal reflux disease Continue PPI therapy   Hyperlipidemia Continue pravastatin 20 mg   Gastroenteritis Suspect viral Follow-up on GI panel as well as C. difficile test to rule out any bacterial pathology   Chronic pain Home tramadol resumed     DVT prophylaxis: Home Eliquis  Code Status: DNR  Diet: Heart healthy diet  Family Communication: A phone call was offered, patient declined stating that her son and her grandson already knows she is in the hospital   Physical Exam:  Constitutional: Elderly female in no acute distress Eyes: PERRL, lids and conjunctivae normal ENNT: Mucous membranes are dry. Posterior pharynx clear of any exudate or lesions. Age-appropriate dentition. Hearing appropriate Neck: normal, supple, no masses, no thyromegaly Respiratory: clear to auscultation bilaterally, no wheezing, no crackles. Normal respiratory effort. No accessory muscle use.  Cardiovascular: Regular rate and rhythm, no murmurs / rubs / gallops. No extremity edema. 2+ pedal pulses. No  carotid bruits.  Abdomen: Nontender soft Musculoskeletal: no clubbing / cyanosis. No joint deformity upper and  lower extremities. Good ROM, no contractures, no atrophy. Normal muscle tone.  Skin: no rashes, lesions, ulcers. No induration Neurologic: Sensation intact. Strength 5/5 in all 4.  Psychiatric: Normal judgment and insight. Alert and oriented x 3. Normal mood.   Vitals:   08/10/22 2301 08/11/22 0336 08/11/22 0732 08/11/22 1248  BP: (!) 109/55 (!) 117/56 (!) 131/56 (!) 131/59  Pulse: 77 73 69 75  Resp: 18 20 16 16   Temp: 98.1 F (36.7 C) 98.2 F (36.8 C) 97.8 F (36.6 C) 98.2 F (36.8 C)  TempSrc: Oral Oral    SpO2: 93% 97% 95% 94%    Data Reviewed: I have reviewed the patient's lab results, ED documentation, admits to physician documentation, nursing documentation, labs, vitals, CT scan of the abdomen and pelvics    Time spent: 50 minutes spent reviewing patient's CT scan of the abdomen, vitals, labs, chart review, nursing documentation as well as ED provider documentation, as well as taking care of this patient including new lab tests placed  Author: Loyce Dys, MD 08/11/2022 2:20 PM  For on call review [email or web address].

## 2022-08-12 ENCOUNTER — Inpatient Hospital Stay: Payer: Medicare PPO

## 2022-08-12 ENCOUNTER — Inpatient Hospital Stay: Payer: Medicare PPO | Admitting: Oncology

## 2022-08-12 DIAGNOSIS — N179 Acute kidney failure, unspecified: Secondary | ICD-10-CM | POA: Diagnosis not present

## 2022-08-12 LAB — GASTROINTESTINAL PANEL BY PCR, STOOL (REPLACES STOOL CULTURE)

## 2022-08-12 LAB — CBC WITH DIFFERENTIAL/PLATELET
Abs Immature Granulocytes: 0.09 10*3/uL — ABNORMAL HIGH (ref 0.00–0.07)
Basophils Absolute: 0.1 10*3/uL (ref 0.0–0.1)
Basophils Relative: 1 %
Eosinophils Absolute: 0.6 10*3/uL — ABNORMAL HIGH (ref 0.0–0.5)
Eosinophils Relative: 7 %
HCT: 37.4 % (ref 36.0–46.0)
Hemoglobin: 11.7 g/dL — ABNORMAL LOW (ref 12.0–15.0)
Immature Granulocytes: 1 %
Lymphocytes Relative: 32 %
Lymphs Abs: 3.1 10*3/uL (ref 0.7–4.0)
MCH: 27.1 pg (ref 26.0–34.0)
MCHC: 31.3 g/dL (ref 30.0–36.0)
MCV: 86.8 fL (ref 80.0–100.0)
Monocytes Absolute: 1 10*3/uL (ref 0.1–1.0)
Monocytes Relative: 10 %
Neutro Abs: 4.8 10*3/uL (ref 1.7–7.7)
Neutrophils Relative %: 49 %
Platelets: 208 10*3/uL (ref 150–400)
RBC: 4.31 MIL/uL (ref 3.87–5.11)
RDW: 15.2 % (ref 11.5–15.5)
WBC: 9.6 10*3/uL (ref 4.0–10.5)
nRBC: 0 % (ref 0.0–0.2)

## 2022-08-12 LAB — BASIC METABOLIC PANEL
Anion gap: 11 (ref 5–15)
BUN: 30 mg/dL — ABNORMAL HIGH (ref 8–23)
CO2: 25 mmol/L (ref 22–32)
Calcium: 8.9 mg/dL (ref 8.9–10.3)
Chloride: 103 mmol/L (ref 98–111)
Creatinine, Ser: 1.11 mg/dL — ABNORMAL HIGH (ref 0.44–1.00)
GFR, Estimated: 51 mL/min — ABNORMAL LOW (ref >60)
Glucose, Bld: 110 mg/dL — ABNORMAL HIGH (ref 70–99)
Potassium: 3.7 mmol/L (ref 3.5–5.1)
Sodium: 139 mmol/L (ref 135–145)

## 2022-08-12 LAB — C DIFFICILE QUICK SCREEN W PCR REFLEX
C Diff antigen: POSITIVE — AB
C Diff toxin: NEGATIVE

## 2022-08-12 LAB — CLOSTRIDIUM DIFFICILE BY PCR, REFLEXED: Toxigenic C. Difficile by PCR: POSITIVE — AB

## 2022-08-12 MED ORDER — TRAMADOL HCL 50 MG PO TABS
50.0000 mg | ORAL_TABLET | Freq: Four times a day (QID) | ORAL | Status: DC | PRN
  Administered 2022-08-12 – 2022-08-14 (×5): 50 mg via ORAL
  Filled 2022-08-12 (×5): qty 1

## 2022-08-12 NOTE — Plan of Care (Signed)

## 2022-08-12 NOTE — Progress Notes (Addendum)
Progress Note   Patient: Kerri Carter AVW:[id number] DOB: 08/20/1940 DOA: 08/10/2022     1 DOS: the patient was seen and examined on 08/12/2022    Subjective:  Patient seen and examined at bedside this morning Chief complaint of diarrhea Tells me nausea and vomiting improved as well as abdominal pain I requested for GI panel as well as C. Difficile Noticed to have significant hypokalemia Denies cough chest pain shortness of breath   Brief hospital course:  From HPI "[name] is a 82 year old female with history of depression, hypertension, hyperlipidemia, heart failure preserved ejection fraction, COPD, paroxysmal atrial fibrillation on Eliquis, history of left renal artery aneurysm, history of right nephrectomy, who presents emergency department for chief concerns of nausea, vomiting, abdominal pain with loose stools.   Vitals in the ED showed temperature 98, respiration rate 19, heart rate 85, blood pressure 112/61, SpO2 97% on room air.   Serum sodium is 133, potassium 3.5, chloride 85, bicarb 30, BUN of 66, serum creatinine of 2.19, EGFR 22, nonfasting blood glucose 169, WBC 15.3, hemoglobin 14.6, platelets of 277.   Lipase was 78.  High sensitive troponin was 28 and on repeat was 24.   CT abdomen pelvis wo contrast: Did not review acute pathology   Hospitalist service therefore consulted for admission"    Assessment and Plan:  AKI (acute kidney injury) (HCC)-improved Presumed secondary to prerenal in setting of intractable nausea and vomiting at home Status post sodium chloride 500 mL bolus per EDP Resolved with IV fluids Monitor renal function closely   Gastroenteritis - suspect viral Intractable nausea and vomiting-improved Diarrhea - imrpving Now off IV fluids Supportive care with antiemetics PRN Follow-up on GI panel as well as C. difficile test   Severe hypokalemia likely secondary to diarrhea and vomiting Potassium 2.6 >> 4.1 >> 3.7 today Continue  repletion and monitoring  Paroxysmal atrial fibrillation (HCC) Continue Home Eliquis 5 mg p.o. twice daily resumed on admission Rate controlled  Essential hypertension Continue amlodipine 5 mg, nebivolol 5 mg  Continue hydralazine 5 mg IV q8h PRN for SBP greater than 170   Chronic heart failure with preserved ejection fraction (HCC) Not in acute exacerbation at this time Monitor volume status closely   Rheumatoid arthritis (HCC) Continue leflunomide 20 mg daily    Gastroesophageal reflux disease Continue PPI therapy   Hyperlipidemia Continue pravastatin 20 mg   Chronic pain Home tramadol resumed  Obesity - prior BMI > 30 There is no height or weight on file to calculate BMI. Complicates overall care and prognosis.  Recommend lifestyle modifications including physical activity and diet for weight loss and overall long-term health.      DVT prophylaxis: On Eliquis  Code Status: DNR  Diet: Heart healthy diet  Family Communication: None present. Pt declines for me to call family.   States her grandson knows she is admitted.   Physical Exam:  General exam: awake, alert, no acute distress, chronically ill appearing HEENT: atraumatic, clear conjunctiva, anicteric sclera, moist mucus membranes, hearing grossly normal  Respiratory system: CTAB but generally diminished, no wheezes, normal respiratory effort at rest, on 3 L/min [place] 02 Cardiovascular system: normal S1/S2, RRR, no JVD, murmurs, rubs, gallops, no pedal edema.   Gastrointestinal system: soft, NT, ND, no HSM felt, +bowel sounds. Central nervous system: A&O x. no gross focal neurologic deficits, normal speech Extremities: moves all, no edema, normal tone Skin: dry, intact, normal temperature, normal color, No rashes, lesions or ulcers Psychiatry: normal mood, congruent  affect, judgement and insight appear normal   Vitals:   08/11/22 2317 08/12/22 0359 08/12/22 0757 08/12/22 1205  BP: (!) 144/70 (!) 158/66 (!)  139/59 (!) 125/52  Pulse: 72 63 65 70  Resp: 18 18 15 16   Temp: 97.8 F (36.6 C) (!) 97.5 F (36.4 C) 98.1 F (36.7 C) 97.8 F (36.6 C)  TempSrc:      SpO2: 93% 93% 92% 91%    Data Reviewed:  Notable labs --- glucose 110, BUN 30, Cr 1.11, Hbg 11.7      Time spent: 42 minutes  Author: Pennie Banter, DO 08/12/2022 1:29 PM  For on call review [email or web address].

## 2022-08-12 NOTE — Assessment & Plan Note (Deleted)
Labs reviewed and discussed with patient. Lab Results  Component Value Date   HGB 11.7 (L) 08/12/2022   TIBC 379 07/16/2022   IRONPCTSAT 21 07/16/2022   FERRITIN 75 07/16/2022     Recommend patient to continue oral ferrous sulfate 325 mg twice daily.

## 2022-08-12 NOTE — TOC Initial Note (Addendum)
Transition of Care [facility]) - Initial/Assessment Note    Patient Details  Name: Kerri Carter MRN: [id number] Date of Birth: 01/18/1941  Transition of Care [facility]) CM/SW Contact:    Margarito Liner, LCSW Phone Number: 08/12/2022, 11:06 AM  Clinical Narrative:  Readmission prevention screen complete. CSW met with patient. No supports at bedside. CSW introduced role and explained that discharge planning would be discussed. PCP is Dana Allan, MD. She uses [facility] [place] appointments and her son takes her to out-of-county appointments. [facility] [place]. No issues obtaining medications. Patient lives home with her grandson, his wife, and their two daughters. She is active with [name] for PT. CSW left message for liaison to see if they can add SW. Patient stated her previous Child psychotherapist for [employer] had told her that she qualifies for 4 hours of PCS services per day and she was put on the wait list. Patient has not heard any updates since then. Patient has a lift chair, adjustable bed, RW, electric chair, over the bed table, handrails at toilet and shower, shower chair, and ramp. Patient is also on 3 L chronic oxygen through [employer]. No further concerns. CSW encouraged patient to contact CSW as needed. CSW will continue to follow patient for support and facilitate return home once stable. Her son, August Saucer, will likely transport her home at discharge and bring her home oxygen for the ride.                1:00 pm: [name] is able to add a Child psychotherapist. Patient previously had referral to [facility]. Liaison said they have not spoken with patient or family since August. She will reach out to son to see if they need any extra support.  Expected Discharge Plan: Home w Home Health Services Barriers to Discharge: Continued Medical Work up   Patient Goals and CMS Choice            Expected Discharge Plan and Services     Post Acute Care Choice: Resumption of Svcs/PTA Provider Living  arrangements for the past 2 months: Single Family Home                           HH Arranged: PT HH Agency: [facility] Date [facility] Agency Contacted: 08/12/22   Representative spoke with at [facility] Agency: Lorenza Chick  Prior Living Arrangements/Services Living arrangements for the past 2 months: Single Family Home Lives with:: Relatives Patient language and need for interpreter reviewed:: Yes Do you feel safe going back to the place where you live?: Yes      Need for Family Participation in Patient Care: Yes (Comment) Care giver support system in place?: Yes (comment) Current home services: DME, Home PT Criminal Activity/Legal Involvement Pertinent to Current Situation/Hospitalization: No - Comment as needed  Activities of Daily Living      Permission Sought/Granted Permission sought to share information with : Facility Industrial/product designer granted to share information with : Yes, Verbal Permission Granted     Permission granted to share info w AGENCY: [facility]        Emotional Assessment Appearance:: Appears stated age Attitude/Demeanor/Rapport: Engaged, Gracious Affect (typically observed): Accepting, Appropriate, Calm, Pleasant Orientation: : Oriented to Self, Oriented to Place, Oriented to  Time, Oriented to Situation Alcohol / Substance Use: Not Applicable Psych Involvement: No (comment)  Admission diagnosis:  AKI (acute kidney injury) (HCC) [N17.9] Nausea vomiting and diarrhea [R11.2,  R19.7] Patient Active Problem List   Diagnosis Date Noted   AKI (acute kidney injury) (HCC) 08/10/2022   Intractable nausea and vomiting 08/10/2022   Gastroenteritis 08/10/2022   Pain management contract signed 07/28/2022   Primary osteoarthritis of both shoulders 07/28/2022   Acute CHF (congestive heart failure) (HCC) 07/18/2022   Fluid overload 07/16/2022   Right rib fracture 07/16/2022   Oral candida 05/17/2022   Hyperparathyroidism due to renal  insufficiency (HCC) 04/23/2022   Obesity (BMI 30-39.9) 03/31/2022   NSVT (nonsustained ventricular tachycardia) (HCC) 03/30/2022   Acute urinary retention 03/30/2022   Sepsis due to pneumonia (HCC) 03/28/2022   Demand ischemia 03/28/2022   History of major orthopedic surgery 03/19/2022   Iron deficiency anemia due to chronic blood loss 10/09/2021   Chronic heart failure with preserved ejection fraction (HCC) 09/24/2021   Acute on chronic respiratory failure with hypoxia and hypercapnia (HCC) 09/10/2021   Acute on chronic diastolic CHF (congestive heart failure) (HCC) 08/20/2021   Paroxysmal atrial fibrillation (HCC) 07/29/2021   Chronic kidney disease, stage 3a (HCC) 07/23/2021   History of lumbar fusion 05/23/2021   Impaired mobility 12/18/2020   Urinary incontinence 12/18/2020   Lumbar radiculopathy 12/11/2020   Chronic pain of both shoulders 08/29/2019   Hypertriglyceridemia 07/24/2019   Bipolar disorder, in full remission, most recent episode depressed (HCC) 07/13/2019   Essential hypertension 06/16/2019   Chronic pain 06/16/2019   Lymphedema 06/05/2019   Chronic venous insufficiency 05/25/2019   PAD (peripheral artery disease) (HCC) 05/25/2019   Interstitial pulmonary disease (HCC) 07/07/2018   Leukocytosis 10/19/2017   Allergic rhinitis 09/28/2017   Age-related osteoporosis without current pathological fracture 11/05/2016   Peripheral neuropathy 10/28/2016   Osteoporosis 08/27/2016   Drug-induced osteoporosis 08/27/2016   Stage 3b chronic kidney disease (HCC) 08/25/2016   Adnexal mass 08/25/2016   Prediabetes 08/25/2016   Coronary atherosclerosis 08/25/2016   Hyperlipidemia 07/17/2015   Macular degeneration 07/17/2015   Rheumatoid arthritis (HCC) 12/05/2014   Osteoarthritis of knee 06/07/2013   Shortness of breath 10/13/2011   Valvular heart disease 10/13/2011   COPD with acute exacerbation (HCC) 09/09/2011   OSA (obstructive sleep apnea) 09/09/2011    Gastroesophageal reflux disease 02/24/2011   Acquired absence of kidney 02/24/2011   Renal artery stenosis (HCC) 02/24/2011   PCP:  Dana Allan, MD Pharmacy:   [phone] [place] [place] - [address] [place] [address] Phone: [phone] Fax: [phone]  [id number] [place], [place] - [address] [place] [address] Phone: [phone] Fax: [phone]  [facility] - [place], [place] - [address] [facility] [employer] [address] [place] [address] Phone: [phone] Fax: [phone]     Social Determinants of Health (SDOH) Social History: SDOH Screenings   Food Insecurity: No Food Insecurity (07/17/2022)  Housing: Low Risk  (07/17/2022)  Transportation Needs: No Transportation Needs (07/17/2022)  Utilities: Not At Risk (07/17/2022)  Depression (PHQ2-9): Medium Risk (07/29/2022)  Financial Resource Strain: Low Risk  (06/18/2021)  Physical Activity: Inactive (11/24/2019)  Social Connections: Moderately Integrated (06/18/2021)  Stress: No Stress Concern Present (06/18/2021)  Tobacco Use: Medium Risk (08/10/2022)   SDOH Interventions:     Readmission Risk Interventions    08/12/2022   11:05 AM 08/21/2021    3:19 PM 05/24/2021    2:35 PM  Readmission Risk Prevention Plan  Transportation Screening Complete Complete Complete  PCP or Specialist Appt within 3-5  Days   Complete  HRI or Home Care Consult   Complete  Social Work Consult for Recovery Care Planning/Counseling   Complete  Palliative Care Screening   Not Applicable  Medication Review Oceanographer) Complete Complete Complete  PCP or Specialist appointment within 3-5 days of discharge Complete    HRI or Home Care Consult Complete Complete   SW Recovery Care/Counseling Consult Complete Complete   Palliative Care Screening Not Applicable Not Applicable   Skilled Nursing Facility Not Applicable Not Applicable

## 2022-08-13 ENCOUNTER — Other Ambulatory Visit (HOSPITAL_COMMUNITY): Payer: Self-pay

## 2022-08-13 DIAGNOSIS — N179 Acute kidney failure, unspecified: Secondary | ICD-10-CM | POA: Diagnosis not present

## 2022-08-13 LAB — BASIC METABOLIC PANEL
Anion gap: 10 (ref 5–15)
BUN: 17 mg/dL (ref 8–23)
CO2: 28 mmol/L (ref 22–32)
Calcium: 8.9 mg/dL (ref 8.9–10.3)
Chloride: 101 mmol/L (ref 98–111)
Creatinine, Ser: 1.06 mg/dL — ABNORMAL HIGH (ref 0.44–1.00)
GFR, Estimated: 53 mL/min — ABNORMAL LOW (ref >60)
Glucose, Bld: 107 mg/dL — ABNORMAL HIGH (ref 70–99)
Potassium: 3.7 mmol/L (ref 3.5–5.1)
Sodium: 139 mmol/L (ref 135–145)

## 2022-08-13 LAB — MAGNESIUM: Magnesium: 2.1 mg/dL (ref 1.7–2.4)

## 2022-08-13 MED ORDER — VANCOMYCIN HCL 125 MG PO CAPS: 125.0000 mg | ORAL_CAPSULE | Freq: Two times a day (BID) | ORAL | Status: DC

## 2022-08-13 MED ORDER — VANCOMYCIN HCL 125 MG PO CAPS
125.0000 mg | ORAL_CAPSULE | Freq: Four times a day (QID) | ORAL | Status: DC
  Administered 2022-08-13 – 2022-08-14 (×4): 125 mg via ORAL
  Filled 2022-08-13 (×7): qty 1

## 2022-08-13 MED ORDER — VANCOMYCIN HCL 125 MG PO CAPS: 125.0000 mg | ORAL_CAPSULE | ORAL | Status: DC

## 2022-08-13 MED ORDER — VANCOMYCIN HCL 125 MG PO CAPS
125.0000 mg | ORAL_CAPSULE | Freq: Four times a day (QID) | ORAL | Status: DC
  Administered 2022-08-13 (×2): 125 mg via ORAL
  Filled 2022-08-13 (×3): qty 1

## 2022-08-13 MED ORDER — VANCOMYCIN HCL 125 MG PO CAPS: 125.0000 mg | ORAL_CAPSULE | Freq: Every day | ORAL | Status: DC

## 2022-08-13 NOTE — Progress Notes (Signed)
Noted a scant amount of bright red blood in the patient's sputum. Notified on-call provider. Advised to pass this on to the day nursing and medical team. Pt has a strong productive cough. Pt is not in any acute distress.

## 2022-08-13 NOTE — TOC Progression Note (Signed)
Transition of Care [facility]) - Progression Note    Patient Details  Name: Kerri Carter MRN: [id number] Date of Birth: Jan 22, 1941  Transition of Care [facility]) CM/SW Contact  Margarito Liner, LCSW Phone Number: 08/13/2022, 11:23 AM  Clinical Narrative:   [name] can add OT to services.  Expected Discharge Plan: Home w Home Health Services Barriers to Discharge: Continued Medical Work up  Expected Discharge Plan and Services     Post Acute Care Choice: Resumption of Svcs/PTA Provider Living arrangements for the past 2 months: Single Family Home                           HH Arranged: PT [facility] Agency: [facility] Date [facility] Agency Contacted: 08/12/22   Representative spoke with at [facility] Agency: Lorenza Chick   Social Determinants of Health (SDOH) Interventions SDOH Screenings   Food Insecurity: No Food Insecurity (07/17/2022)  Housing: Low Risk  (07/17/2022)  Transportation Needs: No Transportation Needs (07/17/2022)  Utilities: Not At Risk (07/17/2022)  Depression (PHQ2-9): Medium Risk (07/29/2022)  Financial Resource Strain: Low Risk  (06/18/2021)  Physical Activity: Inactive (11/24/2019)  Social Connections: Moderately Integrated (06/18/2021)  Stress: No Stress Concern Present (06/18/2021)  Tobacco Use: Medium Risk (08/10/2022)    Readmission Risk Interventions    08/12/2022   11:05 AM 08/21/2021    3:19 PM 05/24/2021    2:35 PM  Readmission Risk Prevention Plan  Transportation Screening Complete Complete Complete  PCP or Specialist Appt within 3-5 Days   Complete  HRI or Home Care Consult   Complete  Social Work Consult for Recovery Care Planning/Counseling   Complete  Palliative Care Screening   Not Applicable  Medication Review Oceanographer) Complete Complete Complete  PCP or Specialist appointment within 3-5 days of discharge Complete    HRI or Home Care Consult Complete Complete   SW Recovery Care/Counseling Consult Complete Complete   Palliative Care Screening  Not Applicable Not Applicable   Skilled Nursing Facility Not Applicable Not Applicable

## 2022-08-13 NOTE — TOC Benefit Eligibility Note (Signed)
Pharmacy Patient Advocate Encounter  Insurance verification completed.    The patient is insured through [facility]    Ran test claim for Vancomycin 125 caps and it will require a prior authorization.   This test claim was processed through [facility]- copay amounts may vary at other pharmacies due to pharmacy/plan contracts, or as the patient moves through the different stages of their insurance plan.

## 2022-08-13 NOTE — Evaluation (Signed)
Physical Therapy Evaluation Patient Details Name: Kerri Carter MRN: [id number] DOB: January 26, 1941 Today's Date: 08/13/2022  History of Present Illness  Pt is a 82 year old female presenting to the ED with  nausea, vomiting, abdominal pain with loose stools; admitted with AKI, gastroenteritis, hypokalemia; PMH significant for depression, hypertension, hyperlipidemia, heart failure preserved ejection fraction, COPD, paroxysmal atrial fibrillation on Eliquis, history of left renal artery aneurysm, history of right nephrectomy  Clinical Impression  Pt pleasant and eager to work with PT, ultimately did well walking ~70 ft in the room (on baseline 3L) wit RW.  She has chronic b/l UE pain but was able to appropriately use walker and functionally use UEs during bed mobility and transfers.  Pt had subjective fatigue with the effort during exam, but showed ability to safely manage in the home when medically ready for d/c.  She will benefit from continued PT to address functional limitations.        Assistance Recommended at Discharge Intermittent Supervision/Assistance  If plan is discharge home, recommend the following:  Can travel by private vehicle  Assist for transportation;Assistance with cooking/housework;A little help with bathing/dressing/bathroom        Equipment Recommendations None recommended by PT  Recommendations for Other Services       Functional Status Assessment Patient has had a recent decline in their functional status and demonstrates the ability to make significant improvements in function in a reasonable and predictable amount of time.     Precautions / Restrictions Precautions Precautions: Fall Restrictions Weight Bearing Restrictions: No      Mobility  Bed Mobility Overal bed mobility: Modified Independent             General bed mobility comments: Pt was able to get to sitting EOB with minimal UE reliance, minimal cuing    Transfers Overall transfer  level: Needs assistance Equipment used: Rolling walker (2 wheels) Transfers: Sit to/from Stand             General transfer comment: Pt able to rise to standing with walker showing good confidence, minimal cuing for set up and UE use.    Ambulation/Gait Ambulation/Gait assistance: Supervision Gait Distance (Feet): 70 Feet Assistive device: Rolling walker (2 wheels)         General Gait Details: Pt was able to ambulate with slow but steady gait using FWW, on 3L O2 (with SpO2 remaining in the mid/low 90s).  She reports feeling minimally slower, weaker than her baseline but overall showed reasonable confidence and good safety with the effort despite endorsing some fatigue.  Stairs            Wheelchair Mobility     Tilt Bed    Modified Rankin (Stroke Patients Only)       Balance Overall balance assessment: Needs assistance Sitting-balance support: No upper extremity supported Sitting balance-Leahy Scale: Good     Standing balance support: Bilateral upper extremity supported Standing balance-Leahy Scale: Good Standing balance comment: appropriate use of RW in standing w/o excessive reliance                             Pertinent Vitals/Pain Pain Assessment Pain Assessment: 0-10 Pain Score: 7  Pain Location: chronic b/l UE pain, most notedly in the L shoulder    Home Living Family/patient expects to be discharged to:: Private residence Living Arrangements: Other (Comment) (grand son, wife and their kids) Available Help at Discharge: Family;Available PRN/intermittently Type of  Home: House Home Access: Ramped entrance       Home Layout: One level Home Equipment: Rollator (4 wheels);BSC/3in1;Shower seat;Toilet riser;Grab bars - toilet;Hand held shower head;Grab bars - tub/shower;Adaptive equipment;Transport chair;Other (comment);Wheelchair - power Additional Comments: lift chair, adjustable bed that elevates at head and foot    Prior Function  Prior Level of Function : Independent/Modified Independent             Mobility Comments: MOD I amb household distances with rollator ADLs Comments: Pt reports assist with LB dressing, bathing as needed; Assist for IADLs from family     Hand Dominance   Dominant Hand: Right    Extremity/Trunk Assessment   Upper Extremity Assessment Upper Extremity Assessment: RUE deficits/detail;LUE deficits/detail RUE Deficits / Details: AROM/PROM appears WFL; atrophy noted throughout palms LUE Deficits / Details: AROM: shoulder flexion: unable to perform without AAROM; elbow/wrist appear WFL; PROM L shoulder to approx 90 degrees via pt AAROM; Arthirtic changes including ulnar drift noted throughout L Hand; joint changes noted at L shoulder with acromion process protruding; pt reports this is baseline; atrophy noted throughout palmar surface LUE: Shoulder pain with ROM;Shoulder pain at rest LUE Sensation: decreased light touch (in hand/wrist) LUE Coordination: decreased fine motor    Lower Extremity Assessment Lower Extremity Assessment: Generalized weakness;Overall [facility] for tasks assessed    Cervical / Trunk Assessment Cervical / Trunk Assessment: Normal  Communication   Communication: No difficulties  Cognition Arousal/Alertness: Awake/alert Behavior During Therapy: WFL for tasks assessed/performed Overall Cognitive Status: Within Functional Limits for tasks assessed                                          General Comments General comments (skin integrity, edema, etc.): Spo2 94% on 3 L via [place] following mobility, HR 72 bpm following mobility    Exercises     Assessment/Plan    PT Assessment Patient needs continued PT services  PT Problem List Decreased strength;Decreased range of motion;Decreased activity tolerance;Decreased balance;Decreased safety awareness;Decreased knowledge of use of DME;Cardiopulmonary status limiting activity;Pain;Decreased mobility        PT Treatment Interventions DME instruction;Gait training;Stair training;Functional mobility training;Therapeutic activities;Therapeutic exercise;Balance training;Patient/family education    PT Goals (Current goals can be found in the Care Plan section)  Acute Rehab PT Goals Patient Stated Goal: go home PT Goal Formulation: With patient Time For Goal Achievement: 08/26/22 Potential to Achieve Goals: Good    Frequency Min 2X/week     Co-evaluation               AM-PAC PT "6 Clicks" Mobility  Outcome Measure Help needed turning from your back to your side while in a flat bed without using bedrails?: None Help needed moving from lying on your back to sitting on the side of a flat bed without using bedrails?: None Help needed moving to and from a bed to a chair (including a wheelchair)?: A Little Help needed standing up from a chair using your arms (e.g., wheelchair or bedside chair)?: A Little Help needed to walk in hospital room?: A Little Help needed climbing 3-5 steps with a railing? : A Little 6 Click Score: 20    End of Session Equipment Utilized During Treatment: Gait belt Activity Tolerance: Patient tolerated treatment well;Patient limited by fatigue Patient left: with chair alarm set;with call bell/phone within reach Nurse Communication: Mobility status PT Visit Diagnosis: Muscle weakness (generalized) (  M62.81);Difficulty in walking, not elsewhere classified (R26.2)    Time: 1610-9604 PT Time Calculation (min) (ACUTE ONLY): 25 min   Charges:   PT Evaluation $PT Eval Low Complexity: 1 Low PT Treatments $Gait Training: 8-22 mins PT General Charges $$ ACUTE PT VISIT: 1 Visit         Malachi Pro, DPT 08/13/2022, 11:03 AM

## 2022-08-13 NOTE — TOC Benefit Eligibility Note (Signed)
Pharmacy Patient Advocate Encounter  Insurance verification completed.    The patient is insured through [facility]    Ran test claim for Vancomyacin 125 caps and it required a prior authorization.    This test claim was processed through [facility]- copay amounts may vary at other pharmacies due to pharmacy/plan contracts, or as the patient moves through the different stages of their insurance plan.

## 2022-08-13 NOTE — Evaluation (Signed)
Occupational Therapy Evaluation Patient Details Name: Kerri Carter MRN: [id number] DOB: 12/31/1940 Today's Date: 08/13/2022   History of Present Illness Pt is a 82 year old female presenting to the ED with  nausea, vomiting, abdominal pain with loose stools; admitted with AKI, gastroenteritis, hypokalemia; PMH significant for depression, hypertension, hyperlipidemia, heart failure preserved ejection fraction, COPD, paroxysmal atrial fibrillation on Eliquis, history of left renal artery aneurysm, history of right nephrectomy   Clinical Impression   Chart reviewed, pt greeted in chair, agreeable to OT evaluation. Pt is alert and oriented x4, good awareness of current level of functioning. PTA pt requires intermittent assist for ADL, assist for IADL. She amb household distance with rollator. Pt presents with deficits in strength, endurance, activity tolerance, BUE function with pt reporting pain throughout BUE (appears more chronic). Pt reports increase in numbness/tingling in B hands while using rollator, poor grip strength with L hand. Education provided, please see below. Pt would benefit from further acute OT to address functional deficits. OT will continue to follow acutely.      Recommendations for follow up therapy are one component of a multi-disciplinary discharge planning process, led by the attending physician.  Recommendations may be updated based on patient status, additional functional criteria and insurance authorization.   Assistance Recommended at Discharge Intermittent Supervision/Assistance  Patient can return home with the following A little help with bathing/dressing/bathroom;Assist for transportation;Assistance with cooking/housework    Functional Status Assessment  Patient has had a recent decline in their functional status and demonstrates the ability to make significant improvements in function in a reasonable and predictable amount of time.  Equipment Recommendations   Other (comment);None recommended by OT (pt has recommended equipment)    Recommendations for Other Services       Precautions / Restrictions Precautions Precautions: Fall Restrictions Weight Bearing Restrictions: No      Mobility Bed Mobility               General bed mobility comments: NT in recliner pre/post session    Transfers Overall transfer level: Needs assistance Equipment used: Rolling walker (2 wheels) Transfers: Sit to/from Stand Sit to Stand: Supervision                  Balance Overall balance assessment: Needs assistance Sitting-balance support: Feet unsupported Sitting balance-Leahy Scale: Good     Standing balance support: Bilateral upper extremity supported, During functional activity Standing balance-Leahy Scale: Fair                             ADL either performed or assessed with clinical judgement   ADL Overall ADL's : Needs assistance/impaired Eating/Feeding: Set up;Sitting   Grooming: Wash/dry hands;Wash/dry face;Standing;Supervision/safety Grooming Details (indicate cue type and reason): sink level with RW, one vc for safe walker placement         Upper Body Dressing : Supervision/safety Upper Body Dressing Details (indicate cue type and reason): anticipated Lower Body Dressing: Moderate assistance Lower Body Dressing Details (indicate cue type and reason): anticipated- pt has assist as home Toilet Transfer: Supervision/safety;Rolling walker (2 wheels);Ambulation Toilet Transfer Details (indicate cue type and reason): simulated         Functional mobility during ADLs: Supervision/safety;Min guard;Rolling walker (2 wheels) (approx 15' in room 2 attempts with RW)       Vision Patient Visual Report: No change from baseline       Perception     Praxis  Pertinent Vitals/Pain Pain Assessment Pain Assessment: 0-10 Pain Score: 7  Pain Location: BUE pain, R wrist, L shoulder, L wrist Pain Descriptors  / Indicators: Discomfort, Pins and needles Pain Intervention(s): Limited activity within patient's tolerance, Monitored during session, Repositioned     Hand Dominance Right   Extremity/Trunk Assessment Upper Extremity Assessment Upper Extremity Assessment: RUE deficits/detail;LUE deficits/detail RUE Deficits / Details: AROM/PROM appears WFL; atrophy noted throughout palms LUE Deficits / Details: AROM: shoulder flexion: unable to perform without AAROM; elbow/wrist appear WFL; PROM L shoulder to approx 90 degrees via pt AAROM; Arthirtic changes including ulnar drift noted throughout L Hand; joint changes noted at L shoulder with acromion process protruding; pt reports this is baseline; atrophy noted throughout palmar surface LUE: Shoulder pain with ROM;Shoulder pain at rest LUE Sensation: decreased light touch (in hand/wrist) LUE Coordination: decreased fine motor   Lower Extremity Assessment Lower Extremity Assessment: Generalized weakness;Overall [facility] for tasks assessed   Cervical / Trunk Assessment Cervical / Trunk Assessment: Normal   Communication Communication Communication: No difficulties   Cognition Arousal/Alertness: Awake/alert Behavior During Therapy: WFL for tasks assessed/performed Overall Cognitive Status: Within Functional Limits for tasks assessed                                       General Comments  Spo2 94% on 3 L via [place] following mobility, HR 72 bpm following mobility    Exercises Other Exercises Other Exercises: edu re: role of OT, role of rehab, joint protection techniques, positioning for B hand/wrist to improve funciton and to decrease pain   Shoulder Instructions      Home Living Family/patient expects to be discharged to:: Private residence Living Arrangements: Other (Comment) (grand son, wife and their kids) Available Help at Discharge: Family;Available PRN/intermittently Type of Home: House Home Access: Ramped entrance     Home  Layout: One level     Bathroom Shower/Tub: Producer, television/film/video: Handicapped height Bathroom Accessibility: Yes   Home Equipment: Rollator (4 wheels);BSC/3in1;Shower seat;Toilet riser;Grab bars - toilet;Hand held shower head;Grab bars - tub/shower;Adaptive equipment;Transport chair;Other (comment);Wheelchair - power   Additional Comments: lift chair, adjustable bed that elevates at head and foot      Prior Functioning/Environment Prior Level of Function : Independent/Modified Independent             Mobility Comments: MOD I amb household distances with rollator ADLs Comments: Pt reports assist with LB dressing, bathing as needed; Assist for IADLs from family        OT Problem List: Decreased strength;Impaired balance (sitting and/or standing);Decreased coordination;Decreased activity tolerance;Decreased knowledge of use of DME or AE      OT Treatment/Interventions: Self-care/ADL training;Energy conservation;Therapeutic exercise;DME and/or AE instruction;Therapeutic activities;Patient/family education    OT Goals(Current goals can be found in the care plan section) Acute Rehab OT Goals Patient Stated Goal: improve function OT Goal Formulation: With patient Time For Goal Achievement: 08/27/22 Potential to Achieve Goals: Good ADL Goals Pt Will Perform Grooming: with modified independence;standing;sitting Pt Will Perform Lower Body Dressing: with modified independence Pt Will Transfer to Toilet: with modified independence Pt Will Perform Toileting - Clothing Manipulation and hygiene: with modified independence Additional ADL Goal #1: Pt will demonstrate improved BUE positioning while performing ADLs, including joint protection techniques, in order to improve B wrist/hand discomfort  OT Frequency: Min 1X/week    Co-evaluation  AM-PAC OT "6 Clicks" Daily Activity     Outcome Measure Help from another person eating meals?: None Help from another  person taking care of personal grooming?: None Help from another person toileting, which includes using toliet, bedpan, or urinal?: None Help from another person bathing (including washing, rinsing, drying)?: A Little Help from another person to put on and taking off regular upper body clothing?: None Help from another person to put on and taking off regular lower body clothing?: A Little 6 Click Score: 22   End of Session Equipment Utilized During Treatment: Rolling walker (2 wheels);Oxygen  Activity Tolerance: Patient tolerated treatment well Patient left: in chair;with call bell/phone within reach;with chair alarm set  OT Visit Diagnosis: Other abnormalities of gait and mobility (R26.89)                Time: 1610-9604 OT Time Calculation (min): 15 min Charges:  OT General Charges $OT Visit: 1 Visit OT Evaluation $OT Eval Low Complexity: 1 Low  Oleta Mouse, OTD OTR/L  08/13/22, 11:00 AM

## 2022-08-13 NOTE — Plan of Care (Signed)

## 2022-08-13 NOTE — Progress Notes (Signed)
Patient transferring to room 206B. Report given to [facility] nurse.

## 2022-08-13 NOTE — TOC Benefit Eligibility Note (Signed)
Patient Advocate Encounter  Prior Authorization for Vancomycin 125mg  has been approved with [facility].    PA# [id number] Effective dates: 02/02/22 through 02/02/23  Per [facility] test claim, copay for 10 days supply is $10

## 2022-08-13 NOTE — TOC Benefit Eligibility Note (Signed)
Pharmacy Patient Advocate Encounter  Insurance verification completed.    The patient is insured through [facility]    Ran test claim for Dificid 200mg  and the current 10 day co-pay is $100.   This test claim was processed through [facility]- copay amounts may vary at other pharmacies due to pharmacy/plan contracts, or as the patient moves through the different stages of their insurance plan.

## 2022-08-13 NOTE — Progress Notes (Signed)
Progress Note   Patient: Kerri Carter ZOX:[id number] DOB: 1940-09-07 DOA: 08/10/2022     2 DOS: the patient was seen and examined on 08/13/2022     Brief hospital course:  From HPI "Ms. Erion Hermans is a 82 year old female with history of depression, hypertension, hyperlipidemia, heart failure preserved ejection fraction, COPD, paroxysmal atrial fibrillation on Eliquis, history of left renal artery aneurysm, history of right nephrectomy, who presents emergency department for chief concerns of nausea, vomiting, abdominal pain with loose stools.   Vitals in the ED showed temperature 98, respiration rate 19, heart rate 85, blood pressure 112/61, SpO2 97% on room air.   Serum sodium is 133, potassium 3.5, chloride 85, bicarb 30, BUN of 66, serum creatinine of 2.19, EGFR 22, nonfasting blood glucose 169, WBC 15.3, hemoglobin 14.6, platelets of 277.   Lipase was 78.  High sensitive troponin was 28 and on repeat was 24.   CT abdomen pelvis wo contrast: Did not review acute pathology   Hospitalist service therefore consulted for admission"    Assessment and Plan:  AKI (acute kidney injury) (HCC)-improved Presumed secondary to prerenal in setting of intractable nausea and vomiting at home Status post sodium chloride 500 mL bolus per EDP Resolved with IV fluids Monitor renal function closely   Intractable nausea and vomiting-improved Diarrhea due to C diff- improving Now off IV fluids Supportive care with antiemetics PRN Follow-up on GI panel as well as C. difficile test   C diff infection Stool studies positive for C diff (antigen positive, toxin negative, PCR POSITIVE) Start PO Vancomycin 125 mg QID This is patient's 1st C diff recurrence, one prior episode Will discuss with her the 6 week taper vancomycin vs dificid Pharmacy running prior auth for vancomycin Enteric precautions  Severe hypokalemia likely secondary to diarrhea and vomiting Potassium 2.6 >> 4.1 >> 3.7  today Continue repletion and monitoring  Paroxysmal atrial fibrillation (HCC) Continue Home Eliquis 5 mg p.o. twice daily resumed on admission Rate controlled  Essential hypertension Continue amlodipine 5 mg, nebivolol 5 mg  Continue hydralazine 5 mg IV q8h PRN for SBP greater than 170   Chronic heart failure with preserved ejection fraction (HCC) Not in acute exacerbation at this time Monitor volume status closely   Rheumatoid arthritis (HCC) Continue leflunomide 20 mg daily    Gastroesophageal reflux disease Continue PPI therapy   Hyperlipidemia Continue pravastatin 20 mg   Chronic pain Home tramadol resumed  Obesity - prior BMI > 30 There is no height or weight on file to calculate BMI. Complicates overall care and prognosis.  Recommend lifestyle modifications including physical activity and diet for weight loss and overall long-term health.      DVT prophylaxis: On Eliquis  Code Status: DNR  Diet: Heart healthy diet  Family Communication: None present. Pt declines for me to call family.   States her grandson knows she is admitted.     Subjective / Interval history:  Patient up in recliner when seen today, had just worked with PT. States she got short of breath ambulating, but no worse than her baseline. Reports diarrhea is improving, but stools remain loose. Denies nausea/vomiting.  Feels a bit more weak in general.  Appetite has returned.    OBJECTIVE  Physical Exam:  General exam: awake, alert, no acute distress, chronically ill appearing HEENT: atraumatic, clear conjunctiva, anicteric sclera, moist mucus membranes, hearing grossly normal  Respiratory system: CTAB but generally diminished, no wheezes, normal respiratory effort at rest, on 3 L/min [place]  02 Cardiovascular system: normal S1/S2, RRR, no JVD, murmurs, rubs, gallops, no pedal edema.   Gastrointestinal system: soft, NT, ND, no HSM felt, +bowel sounds. Central nervous system: A&O x. no gross  focal neurologic deficits, normal speech Extremities: moves all, no edema, normal tone Skin: dry, intact, normal temperature, normal color, No rashes, lesions or ulcers Psychiatry: normal mood, congruent affect, judgement and insight appear normal   Vitals:   08/12/22 2344 08/13/22 0543 08/13/22 0823 08/13/22 1239  BP: (!) 132/53 (!) 151/53 137/68 (!) 136/58  Pulse: 68 64 66 75  Resp: 20 20 20 20   Temp: 98.2 F (36.8 C) 97.6 F (36.4 C) 97.7 F (36.5 C) 97.7 F (36.5 C)  TempSrc:      SpO2: 95% 96% 96% 96%    Data Reviewed:  Notable labs --- glucose 110, BUN 30, Cr 1.11, Hbg 11.7      Time spent: 42 minutes  Author: Pennie Banter, DO 08/13/2022 2:29 PM  For on call review [email or web address].

## 2022-08-14 ENCOUNTER — Encounter: Payer: Medicare PPO | Admitting: Family

## 2022-08-14 DIAGNOSIS — N179 Acute kidney failure, unspecified: Secondary | ICD-10-CM | POA: Diagnosis not present

## 2022-08-14 LAB — BASIC METABOLIC PANEL
Anion gap: 7 (ref 5–15)
BUN: 17 mg/dL (ref 8–23)
CO2: 29 mmol/L (ref 22–32)
Calcium: 8.7 mg/dL — ABNORMAL LOW (ref 8.9–10.3)
Chloride: 101 mmol/L (ref 98–111)
Creatinine, Ser: 0.94 mg/dL (ref 0.44–1.00)
GFR, Estimated: >60 mL/min (ref >60)
Glucose, Bld: 109 mg/dL — ABNORMAL HIGH (ref 70–99)
Potassium: 4 mmol/L (ref 3.5–5.1)
Sodium: 137 mmol/L (ref 135–145)

## 2022-08-14 MED ORDER — ONDANSETRON HCL 4 MG PO TABS: 4.0000 mg | ORAL_TABLET | Freq: Four times a day (QID) | ORAL | 0 refills | Status: DC | PRN

## 2022-08-14 MED ORDER — VANCOMYCIN HCL 125 MG PO CAPS: ORAL_CAPSULE | ORAL | 0 refills | Status: AC

## 2022-08-14 NOTE — Care Management Important Message (Signed)
Important Message  Patient Details  Name: WHITLEY DALPIAZ MRN: [id number] Date of Birth: 08-Dec-1940   Medicare Important Message Given:  Other (see comment)  Attempted to reach patient via room phone [phone]), no answer to review Medicare IM.  Attempted to reach patient via cell [phone]), however call went to voicemail.  Unable to leave message due to full mailbox.     Johnell Comings 08/14/2022, 1:16 PM

## 2022-08-14 NOTE — Discharge Summary (Signed)
Physician Discharge Summary   Patient: Kerri Carter MRN: [id number] DOB: 1940/11/21  Admit date:     08/10/2022  Discharge date: 08/14/2022  Discharge Physician: Pennie Banter   PCP: Dana Allan, MD   Recommendations at discharge:  {Tip this will not be part of the note when signed- Example include specific recommendations for outpatient follow-up, pending tests to follow-up on. (Optional):26781}  ***  Discharge Diagnoses: Principal Problem:   AKI (acute kidney injury) (HCC) Active Problems:   Intractable nausea and vomiting   Essential hypertension   Interstitial pulmonary disease (HCC)   Paroxysmal atrial fibrillation (HCC)   Chronic heart failure with preserved ejection fraction (HCC)   Rheumatoid arthritis (HCC)   Hyperlipidemia   Gastroesophageal reflux disease   Leukocytosis   PAD (peripheral artery disease) (HCC)   Chronic pain   Coronary atherosclerosis   Chronic kidney disease, stage 3a (HCC)   Iron deficiency anemia due to chronic blood loss   Gastroenteritis  Resolved Problems:   * No resolved hospital problems. [facility] Course: Ms. Takeyia Forcum is a 82 year old female with history of depression, hypertension, hyperlipidemia, heart failure preserved ejection fraction, COPD, paroxysmal atrial fibrillation on Eliquis, history of left renal artery aneurysm, history of right nephrectomy, who presents emergency department for chief concerns of nausea, vomiting, abdominal pain with loose stools.  Vitals in the ED showed temperature 98, respiration rate 19, heart rate 85, blood pressure 112/61, SpO2 97% on room air.  Serum sodium is 133, potassium 3.5, chloride 85, bicarb 30, BUN of 66, serum creatinine of 2.19, EGFR 22, nonfasting blood glucose 169, WBC 15.3, hemoglobin 14.6, platelets of 277.  Lipase was 78.  High sensitive troponin was 28 and on repeat was 24.  CT abdomen pelvis wo contrast: Was read as no acute abnormality or explanation for abdominal pain.   Prior right nephrectomy.  Fat-containing upper abdominal ventral abdominal wall hernia.  Unchanged 7 mm left renal artery aneurysm.  This needs no further follow-up.  Left ovarian cyst, 2.5 cm that is smaller than on prior exam when it was measured at 3 cm.  Consensus guidelines recommend no follow-up imaging for a cyst the size.  ED treatment: Ondansetron 4 mg IV one-time dose, sodium chloride 500 mL bolus.  Assessment and Plan: * AKI (acute kidney injury) (HCC) Presumed secondary to prerenal in setting of intractable nausea and vomiting at home As needed ondansetron ordered Status post sodium chloride 500 mL bolus per EDP Continue with sodium chloride at 100 mL/h, 1 day ordered Recheck BMP in the a.m. If patient's renal function does not improve significantly, a.m. team to consider consultation to nephrology  Intractable nausea and vomiting With diarrhea, suspect gastroenteritis Improved with ondansetron We will continue with ondansetron 4 mg p.o./IV every 6 hours as needed for nausea and vomiting, 5 days ordered Phenergan 12.5 mg IV every 8 hours as needed for refractory nausea and vomiting, 1 dose ordered  Paroxysmal atrial fibrillation (HCC) Home Eliquis 5 mg p.o. twice daily resumed on admission  Essential hypertension Home amlodipine 5 mg daily, nebivolol 5 mg per home dosing resumed Hydralazine 5 mg IV every 8 hours as needed for SBP greater than 170, 4 days ordered  Chronic heart failure with preserved ejection fraction (HCC) Not in acute exacerbation at this time  Rheumatoid arthritis (HCC) Leflunomide 20 mg daily resumed  Gastroesophageal reflux disease Home PPI resumed  Hyperlipidemia Home pravastatin 20 mg daily resumed  Gastroenteritis Suspect viral Symptomatic support  Chronic pain Home  tramadol resumed      {Tip this will not be part of the note when signed There is no height or weight on file to calculate BMI. , ,  (Optional):26781}  {(NOTE) Pain  control PDMP Statment (Optional):26782} Consultants: *** Procedures performed: ***  Disposition: {Plan; Disposition:26390} Diet recommendation:  {Diet_Plan:26776} DISCHARGE MEDICATION: Allergies as of 08/14/2022       Reactions   Ceftin [cefuroxime Axetil] Anaphylaxis   Lisinopril Anaphylaxis   Sulfa Antibiotics Other (See Comments)   Face swelling   Sulfasalazine Anaphylaxis   Morphine Other (See Comments)   Per patient, low blood pressure issues that requires action to raise it back up. Can take small infrequent doses   Xarelto [rivaroxaban] Other (See Comments)   Stomach burning, bleeding, and tar in stool   Adhesive [tape] Rash   Paper tape and tega derm OK   Antihistamines, Chlorpheniramine-type Other (See Comments)   Makes pt hyper   Antivert [meclizine Hcl] Other (See Comments)   Bladder will not empty   Aspirin Other (See Comments)   Sulfasalazine allergy cross reacts   Contrast Media [iodinated Contrast Media] Rash   she is able to use betadine scrubs.   Decongestant [pseudoephedrine Hcl] Other (See Comments)   Makes pt hyper   Doxycycline Other (See Comments)   GI upset   Levaquin [levofloxacin In D5w] Rash   Polymyxin B Other (See Comments)   Facial rash   Tetanus Toxoids Rash, Other (See Comments)   Fever and hot to touch at injection site        Medication List     STOP taking these medications    budesonide 0.5 MG/2ML nebulizer solution Commonly known as: PULMICORT   metolazone 2.5 MG tablet Commonly known as: ZAROXOLYN   potassium chloride SA 20 MEQ tablet Commonly known as: KLOR-CON M       TAKE these medications    albuterol 108 (90 Base) MCG/ACT inhaler Commonly known as: Ventolin HFA Inhale 2 puffs into the lungs every 6 (six) hours as needed for wheezing or shortness of breath. What changed: Another medication with the same name was removed. Continue taking this medication, and follow the directions you see here.   amLODipine 5 MG  tablet Commonly known as: NORVASC Take 1 tablet (5 mg total) by mouth daily. What changed: when to take this   apixaban 5 MG Tabs tablet Commonly known as: ELIQUIS Take 1 tablet (5 mg total) by mouth 2 (two) times daily.   busPIRone 5 MG tablet Commonly known as: BUSPAR Take 1 tablet (5 mg total) by mouth 2 (two) times daily.   cetirizine 10 MG tablet Commonly known as: ZYRTEC TAKE 1 TABLET BY MOUTH EVERYDAY AT BEDTIME   dicyclomine 10 MG capsule Commonly known as: BENTYL Take 1 capsule (10 mg total) by mouth 3 (three) times daily before meals. What changed:  when to take this reasons to take this   Enbrel SureClick 50 MG/ML injection Generic drug: etanercept Inject 50 mg into the skin once a week. Takes on Fridays   escitalopram 10 MG tablet Commonly known as: LEXAPRO Take 1 tablet (10 mg total) by mouth daily.   gabapentin 300 MG capsule Commonly known as: NEURONTIN Take 300 mg in the morning and 600 mg at night. What changed:  how much to take how to take this when to take this   Gemtesa 75 MG Tabs Generic drug: Vibegron Take 75 mg by mouth daily.   ipratropium-albuterol 0.5-2.5 (3) MG/3ML Soln Commonly  known as: DUONEB SMARTSIG:3 Milliliter(s) Via Nebulizer Every 6 Hours PRN   lamoTRIgine 100 MG tablet Commonly known as: LAMICTAL TAKE 1 TAB 2 TIMES DAILY. FURTHER REFILLS NEW PSYCH FOR ALL PSYCH MEDS ONLY TEMP SUPPLY FROM PCP   leflunomide 20 MG tablet Commonly known as: ARAVA Take 1 tablet (20 mg total) by mouth daily.   lidocaine 5 % Commonly known as: Lidoderm Place 1 patch onto the skin 2 (two) times daily as needed. Remove & Discard patch within 12 hours or as directed by MD   lovastatin 20 MG tablet Commonly known as: MEVACOR Take 1 tablet (20 mg total) by mouth daily at 6 PM.   methocarbamol 500 MG tablet Commonly known as: ROBAXIN Take 500 mg by mouth 4 (four) times daily as needed for muscle spasms. Taking twice a day currently    mometasone 50 MCG/ACT nasal spray Commonly known as: Nasonex 1 spray to each nostril twice a day   montelukast 10 MG tablet Commonly known as: SINGULAIR Take 1 tablet (10 mg total) by mouth daily.   multivitamin-lutein Caps capsule Take 1 capsule by mouth at bedtime.   nebivolol 5 MG tablet Commonly known as: Bystolic Take 1 tablet (5 mg total) by mouth in the morning and at bedtime. (Note dose changed from 1/2 10 mg bid to 5 mg bid)   ondansetron 4 MG tablet Commonly known as: ZOFRAN Take 1 tablet (4 mg total) by mouth every 6 (six) hours as needed for nausea.   OXYGEN Inhale 3 L into the lungs continuous.   pantoprazole 40 MG tablet Commonly known as: PROTONIX TAKE 1 TABLET BY MOUTH 2 TIMES DAILY 30 MIN BEFORE FOOD (NOTE REDUCTION IN FREQUENCY)   predniSONE 5 MG tablet Commonly known as: DELTASONE TAKE 1 TABLET BY MOUTH EVERY DAY WITH BREAKFAST   Prolia 60 MG/ML Sosy injection Generic drug: denosumab Inject 60 mg into the skin every 6 (six) months.   QUEtiapine 25 MG tablet Commonly known as: SEROQUEL TAKE 1 TABLET (25 MG TOTAL) BY MOUTH AT BEDTIME. AGAIN LAST FILL FURTHER REFILLS FROM PSYCHIATRY NO EXCEPTIONS   sucralfate 1 g tablet Commonly known as: CARAFATE TAKE 1 TABLET BY MOUTH 2 TIMES DAILY.   torsemide 20 MG tablet Commonly known as: DEMADEX Take 2 tablets (40 mg total) by mouth daily.   traMADol 50 MG tablet Commonly known as: ULTRAM Take 1 tablet (50 mg total) by mouth every 12 (twelve) hours as needed.   Trelegy Ellipta 100-62.5-25 MCG/ACT Aepb Generic drug: Fluticasone-Umeclidin-Vilant Inhale 1 puff into the lungs daily.   vancomycin 125 MG capsule Commonly known as: VANCOCIN Take 1 capsule (125 mg total) by mouth 4 (four) times daily for 9 days, THEN 1 capsule (125 mg total) 2 (two) times daily for 7 days, THEN 1 capsule (125 mg total) daily for 7 days, THEN 1 capsule (125 mg total) every other day for 7 days, THEN 1 capsule (125 mg total)  every 3 (three) days for 7 days. Start taking on: August 14, 2022        Discharge Exam: There were no vitals filed for this visit. ***  Condition at discharge: {DC Condition:26389}  The results of significant diagnostics from this hospitalization (including imaging, microbiology, ancillary and laboratory) are listed below for reference.   Imaging Studies: DG Ribs Unilateral W/Chest Left  Result Date: 08/12/2022 CLINICAL DATA:  Left-sided chest pain EXAM: LEFT RIBS AND CHEST - 3+ VIEW COMPARISON:  07/16/2022 FINDINGS: Cardiac shadow is within normal limits. By  lateral basilar atelectasis is seen. Postsurgical changes are noted in both shoulders. Old rib fractures are noted bilaterally with healing. No acute displaced rib fractures are noted on the left. IMPRESSION: Old rib fractures are noted bilaterally.  No acute abnormality seen. Electronically Signed   By: Alcide Clever M.D.   On: 08/12/2022 13:24   CT ABDOMEN PELVIS WO CONTRAST  Result Date: 08/10/2022 CLINICAL DATA:  Abdominal pain, acute, nonlocalized Nausea and vomiting. EXAM: CT ABDOMEN AND PELVIS WITHOUT CONTRAST TECHNIQUE: Multidetector CT imaging of the abdomen and pelvis was performed following the standard protocol without IV contrast. RADIATION DOSE REDUCTION: This exam was performed according to the departmental dose-optimization program which includes automated exposure control, adjustment of the mA and/or kV according to patient size and/or use of iterative reconstruction technique. COMPARISON:  02/10/2021 FINDINGS: Lower chest: No acute findings. Minimal hiatal hernia. Again seen mitral annulus calcifications. Hepatobiliary: Unremarkable unenhanced appearance of the liver. Cholecystectomy. No biliary dilatation. Pancreas: No ductal dilatation or inflammation. Spleen: Normal in size without focal abnormality. Adrenals/Urinary Tract: Normal adrenal glands. Prior right nephrectomy. No left hydronephrosis. No renal calculi.  Subcentimeter low-density left renal lesions are too small to characterize. No specific imaging follow-up is needed. 7 mm renal artery aneurysm, unchanged. Unremarkable urinary bladder. Stomach/Bowel: Minimal hiatal hernia. No gastric wall thickening. Duodenal diverticulum. No small bowel obstruction or inflammation. Appendectomy. Small volume of stool in the colon. No colonic inflammation. Vascular/Lymphatic: Aortic atherosclerosis without aneurysm. 7 mm left renal artery aneurysm no abdominopelvic adenopathy. Reproductive: The uterus is poorly defined. There is a 2.5 cm left ovarian cyst that is simple appearing, smaller than on prior exam when it measured 3 cm. Other: No ascites. No free air. There is a fat containing upper abdominal ventral abdominal wall hernia. Musculoskeletal: L2 through L5 fusion hardware. T10 inferior endplate compression deformity is stable in appearance from chest CT last month. Bilateral hip arthroplasties. IMPRESSION: 1. No acute abnormality or explanation for abdominal pain. 2. Prior right nephrectomy. 3. Fat containing upper abdominal ventral abdominal wall hernia. 4. Unchanged 7 mm left renal artery aneurysm. This needs no further follow-up. 5. A 2.5 cm left ovarian cyst is smaller than on prior exam when it measured 3 cm. This is likely benign. Consensus guidelines recommend no follow-up imaging for a cyst of this size. Aortic Atherosclerosis (ICD10-I70.0). Electronically Signed   By: Narda Rutherford M.D.   On: 08/10/2022 17:29   US Venous Img Lower Bilateral (DVT)  Result Date: 07/16/2022 CLINICAL DATA:  Bilateral lower extremity swelling EXAM: BILATERAL LOWER EXTREMITY VENOUS DOPPLER ULTRASOUND TECHNIQUE: Gray-scale sonography with compression, as well as color and duplex ultrasound, were performed to evaluate the deep venous system(s) from the level of the common femoral vein through the popliteal and proximal calf veins. COMPARISON:  None Available. FINDINGS: VENOUS Normal  compressibility of the common femoral, superficial femoral, and popliteal veins, as well as the visualized calf veins. Visualized portions of profunda femoral vein and great saphenous vein unremarkable. No filling defects to suggest DVT on grayscale or color Doppler imaging. Doppler waveforms show normal direction of venous flow, normal respiratory plasticity and response to augmentation. Limited views of the contralateral common femoral vein are unremarkable. OTHER Left Baker's cyst measures up to 3.7 cm. Limitations: none IMPRESSION: No evidence of lower extremity DVT. Left Baker's cyst. Electronically Signed   By: Charlett Nose M.D.   On: 07/16/2022 23:09   CT CHEST WO CONTRAST  Addendum Date: 07/16/2022   ADDENDUM REPORT: 07/16/2022 19:56 ADDENDUM: Upon  further evaluation, an acute, nondisplaced fracture of the posteromedial first right rib is seen (axial CT images 8 through 14, CT series 4). This represents a new finding when compared to the prior study. Multiple chronic bilateral posterolateral fractures are also identified. Electronically Signed   By: Aram Candela M.D.   On: 07/16/2022 19:56   Result Date: 07/16/2022 CLINICAL DATA:  Shortness of breath with exertion. EXAM: CT CHEST WITHOUT CONTRAST TECHNIQUE: Multidetector CT imaging of the chest was performed following the standard protocol without IV contrast. RADIATION DOSE REDUCTION: This exam was performed according to the departmental dose-optimization program which includes automated exposure control, adjustment of the mA and/or kV according to patient size and/or use of iterative reconstruction technique. COMPARISON:  September 08, 2021 FINDINGS: Cardiovascular: There is moderate severity calcification of the thoracic aorta, without evidence of aortic aneurysm. Normal heart size with marked severity coronary artery calcification. No pericardial effusion. Mediastinum/Nodes: No enlarged mediastinal or axillary lymph nodes. Thyroid gland, trachea,  and esophagus demonstrate no significant findings. Lungs/Pleura: Mild diffuse interstitial thickening is seen involving predominantly the bilateral upper lobes and bilateral apices. Mild, hazy posterior right upper lobe, posteromedial right lower lobe and posterior left lower lobe infiltrates are seen. This is decreased in severity when compared to the prior study. Mild, adjacent areas of right upper lobe and bilateral lower lobe scarring and/or atelectasis are seen. There are small bilateral pleural effusions. No pneumothorax is identified. Upper Abdomen: No acute abnormality. Musculoskeletal: Bilateral shoulder replacements are seen. There is exaggerated kyphosis of the thoracic spine with multilevel degenerative changes. IMPRESSION: 1. Mild diffuse interstitial thickening with mild right upper lobe, posteromedial right lower lobe and posterior left lower lobe infiltrates. 2. Small bilateral pleural effusions. 3. Marked severity coronary artery calcification. 4. Aortic atherosclerosis. Aortic Atherosclerosis (ICD10-I70.0). Electronically Signed: By: Aram Candela M.D. On: 07/16/2022 18:44   DG Chest 2 View  Result Date: 07/16/2022 CLINICAL DATA:  sob EXAM: CHEST - 2 VIEW COMPARISON:  CXR 06/04/22 FINDINGS: Bilateral shoulder arthroplasties. Small bilateral pleural effusions. Unchanged cardiac contours. There is asymmetric prominence of the right hilum/infrahilar region, which could represent an underlying airspace opacity redemonstrated chronic right-sided rib fractures. No definite radiographic evidence of a new displaced rib fracture. Visualized upper abdomen is unremarkable. Vertebral body heights are maintained. Partially visualized thoracolumbar spinal fusion hardware in place. IMPRESSION: 1.  Small bilateral pleural effusions, unchanged. 2. Possible focal opacity in the right infrahilar region, which could represent atelectasis or infection. Electronically Signed   By: Lorenza Cambridge M.D.   On:  07/16/2022 15:30    Microbiology: Results for orders placed or performed during the hospital encounter of 08/10/22  Gastrointestinal Panel by PCR , Stool     Status: None   Collection Time: 08/12/22  5:33 PM   Specimen: Stool  Result Value Ref Range Status   Campylobacter species NOT DETECTED NOT DETECTED Final   Plesimonas shigelloides NOT DETECTED NOT DETECTED Final   Salmonella species NOT DETECTED NOT DETECTED Final   Yersinia enterocolitica NOT DETECTED NOT DETECTED Final   Vibrio species NOT DETECTED NOT DETECTED Final   Vibrio cholerae NOT DETECTED NOT DETECTED Final   Enteroaggregative E coli (EAEC) NOT DETECTED NOT DETECTED Final   Enteropathogenic E coli (EPEC) NOT DETECTED NOT DETECTED Final   Enterotoxigenic E coli (ETEC) NOT DETECTED NOT DETECTED Final   Shiga like toxin producing E coli (STEC) NOT DETECTED NOT DETECTED Final   Shigella/Enteroinvasive E coli (EIEC) NOT DETECTED NOT DETECTED Final   Cryptosporidium  NOT DETECTED NOT DETECTED Final   Cyclospora cayetanensis NOT DETECTED NOT DETECTED Final   Entamoeba histolytica NOT DETECTED NOT DETECTED Final   Giardia lamblia NOT DETECTED NOT DETECTED Final   Adenovirus F40/41 NOT DETECTED NOT DETECTED Final   Astrovirus NOT DETECTED NOT DETECTED Final   Norovirus GI/GII NOT DETECTED NOT DETECTED Final   Rotavirus A NOT DETECTED NOT DETECTED Final   Sapovirus (I, II, IV, and V) NOT DETECTED NOT DETECTED Final    Comment: Performed at [facility], [address], [place], [place] [address]  C Difficile Quick Screen w PCR reflex     Status: Abnormal   Collection Time: 08/12/22  5:33 PM   Specimen: STOOL  Result Value Ref Range Status   C Diff antigen POSITIVE (A) NEGATIVE Final   C Diff toxin NEGATIVE NEGATIVE Final   C Diff interpretation Results are indeterminate. See PCR results.  Final    Comment: Performed at [facility], [address] [place], [place] [address]  C. Diff by PCR, Reflexed      Status: Abnormal   Collection Time: 08/12/22  5:33 PM  Result Value Ref Range Status   Toxigenic C. Difficile by PCR POSITIVE (A) NEGATIVE Final    Comment: Positive for toxigenic C. difficile with little to no toxin production. Only treat if clinical presentation suggests symptomatic illness. Performed at [facility], [address] [place], [place] [address]    *Note: Due to a large number of results and/or encounters for the requested time period, some results have not been displayed. A complete set of results can be found in Results Review.    Labs: CBC: Recent Labs  Lab 08/10/22 1519 08/11/22 0414 08/12/22 0434  WBC 15.3* 9.9 9.6  NEUTROABS  --   --  4.8  HGB 14.6 12.2 11.7*  HCT 45.3 38.7 37.4  MCV 84.7 85.4 86.8  PLT 277 214 208   Basic Metabolic Panel: Recent Labs  Lab 08/10/22 1519 08/11/22 0414 08/11/22 1308 08/12/22 0434 08/13/22 0539 08/14/22 0437  NA 133* 135  --  139 139 137  K 3.5 2.6* 4.1 3.7 3.7 4.0  CL 85* 93*  --  103 101 101  CO2 30 29  --  25 28 29   GLUCOSE 169* 102*  --  110* 107* 109*  BUN 66* 55*  --  30* 17 17  CREATININE 2.19* 1.78*  --  1.11* 1.06* 0.94  CALCIUM 9.8 8.5*  --  8.9 8.9 8.7*  MG  --   --   --   --  2.1  --    Liver Function Tests: Recent Labs  Lab 08/10/22 1519  AST 28  ALT 18  ALKPHOS 96  BILITOT 0.9  PROT 8.6*  ALBUMIN 4.5   CBG: No results for input(s): "GLUCAP" in the last 168 hours.  Discharge time spent: {LESS THAN/GREATER BJYN:82956} 30 minutes.  Signed: Pennie Banter, DO [facility] 08/14/2022

## 2022-08-14 NOTE — Progress Notes (Signed)
Mobility Specialist - Progress Note     08/14/22 1100  Mobility  Activity Ambulated with assistance in room;Ambulated with assistance to bathroom  Level of Assistance Standby assist, set-up cues, supervision of patient - no hands on  Assistive Device Front wheel walker  Distance Ambulated (ft) 40 ft  Range of Motion/Exercises Active  Activity Response Tolerated well  Mobility Referral Yes  $Mobility charge 1 Mobility  Mobility Specialist Start Time (ACUTE ONLY) 1059  Mobility Specialist Stop Time (ACUTE ONLY) 1114  Mobility Specialist Time Calculation (min) (ACUTE ONLY) 15 min   Pt resting in recliner on 3L upon entry. Pt STS and ambulates to bathroom SBA with RW. Pt returned to recliner and left with needs in reach. Pt endorses SOB on return back due to temporary disconnection of O2. Pt breathing returned to normal after O2 reapplied and 2 minute seated rest break. Pt RN present at bedside.   Johnathan Hausen Mobility Specialist 08/14/22, 11:23 AM

## 2022-08-17 ENCOUNTER — Telehealth: Payer: Self-pay

## 2022-08-17 ENCOUNTER — Telehealth: Payer: Self-pay | Admitting: *Deleted

## 2022-08-17 DIAGNOSIS — F4323 Adjustment disorder with mixed anxiety and depressed mood: Secondary | ICD-10-CM | POA: Diagnosis not present

## 2022-08-17 DIAGNOSIS — D631 Anemia in chronic kidney disease: Secondary | ICD-10-CM | POA: Diagnosis not present

## 2022-08-17 DIAGNOSIS — N1831 Chronic kidney disease, stage 3a: Secondary | ICD-10-CM | POA: Diagnosis not present

## 2022-08-17 DIAGNOSIS — I5043 Acute on chronic combined systolic (congestive) and diastolic (congestive) heart failure: Secondary | ICD-10-CM | POA: Diagnosis not present

## 2022-08-17 DIAGNOSIS — I130 Hypertensive heart and chronic kidney disease with heart failure and stage 1 through stage 4 chronic kidney disease, or unspecified chronic kidney disease: Secondary | ICD-10-CM | POA: Diagnosis not present

## 2022-08-17 DIAGNOSIS — M800AXD Age-related osteoporosis with current pathological fracture, other site, subsequent encounter for fracture with routine healing: Secondary | ICD-10-CM | POA: Diagnosis not present

## 2022-08-17 DIAGNOSIS — I480 Paroxysmal atrial fibrillation: Secondary | ICD-10-CM | POA: Diagnosis not present

## 2022-08-17 DIAGNOSIS — I2510 Atherosclerotic heart disease of native coronary artery without angina pectoris: Secondary | ICD-10-CM | POA: Diagnosis not present

## 2022-08-17 DIAGNOSIS — F3160 Bipolar disorder, current episode mixed, unspecified: Secondary | ICD-10-CM | POA: Diagnosis not present

## 2022-08-17 NOTE — Transitions of Care (Post Inpatient/ED Visit) (Signed)
   08/17/2022  Name: Kerri Carter MRN: [id number] DOB: 08-31-1940  Today's TOC FU Call Status: Today's TOC FU Call Status:: Unsuccessul Call (1st Attempt) Unsuccessful Call (1st Attempt) Date: 08/17/22  Attempted to reach the patient regarding the most recent Inpatient/ED visit.  Follow Up Plan: Additional outreach attempts will be made to reach the patient to complete the Transitions of Care (Post Inpatient/ED visit) call.   [name] BSN RN [facility] [phone]

## 2022-08-17 NOTE — Telephone Encounter (Signed)
Kerri Carter called from [facility] to state she resumed home health physical therapy today and we are going to receive a fax with the order.

## 2022-08-18 ENCOUNTER — Telehealth: Payer: Self-pay | Admitting: *Deleted

## 2022-08-18 ENCOUNTER — Telehealth: Payer: Self-pay | Admitting: Student in an Organized Health Care Education/Training Program

## 2022-08-18 NOTE — Transitions of Care (Post Inpatient/ED Visit) (Signed)
   08/18/2022  Name: Kerri Carter MRN: [id number] DOB: 06-23-1940  Today's TOC FU Call Status: Today's TOC FU Call Status:: Unsuccessful Call (2nd Attempt) Unsuccessful Call (2nd Attempt) Date: 08/18/22  Attempted to reach the patient regarding the most recent Inpatient/ED visit.  Follow Up Plan: Additional outreach attempts will be made to reach the patient to complete the Transitions of Care (Post Inpatient/ED visit) call.   [name] BSN RN [facility] [phone]

## 2022-08-18 NOTE — Telephone Encounter (Signed)
PT will like to know which medications she can take on the day of her appt for her procedure. Please give patient a call. TY

## 2022-08-19 ENCOUNTER — Telehealth: Payer: Self-pay | Admitting: *Deleted

## 2022-08-19 DIAGNOSIS — D631 Anemia in chronic kidney disease: Secondary | ICD-10-CM | POA: Diagnosis not present

## 2022-08-19 DIAGNOSIS — I5043 Acute on chronic combined systolic (congestive) and diastolic (congestive) heart failure: Secondary | ICD-10-CM | POA: Diagnosis not present

## 2022-08-19 DIAGNOSIS — I130 Hypertensive heart and chronic kidney disease with heart failure and stage 1 through stage 4 chronic kidney disease, or unspecified chronic kidney disease: Secondary | ICD-10-CM | POA: Diagnosis not present

## 2022-08-19 DIAGNOSIS — F4323 Adjustment disorder with mixed anxiety and depressed mood: Secondary | ICD-10-CM | POA: Diagnosis not present

## 2022-08-19 DIAGNOSIS — M800AXD Age-related osteoporosis with current pathological fracture, other site, subsequent encounter for fracture with routine healing: Secondary | ICD-10-CM | POA: Diagnosis not present

## 2022-08-19 DIAGNOSIS — N1831 Chronic kidney disease, stage 3a: Secondary | ICD-10-CM | POA: Diagnosis not present

## 2022-08-19 DIAGNOSIS — I2510 Atherosclerotic heart disease of native coronary artery without angina pectoris: Secondary | ICD-10-CM | POA: Diagnosis not present

## 2022-08-19 DIAGNOSIS — F3160 Bipolar disorder, current episode mixed, unspecified: Secondary | ICD-10-CM | POA: Diagnosis not present

## 2022-08-19 DIAGNOSIS — I480 Paroxysmal atrial fibrillation: Secondary | ICD-10-CM | POA: Diagnosis not present

## 2022-08-19 NOTE — Telephone Encounter (Signed)
Called patient and discussed.

## 2022-08-19 NOTE — Transitions of Care (Post Inpatient/ED Visit) (Signed)
   08/19/2022  Name: Kerri Carter MRN: [id number] DOB: Sep 29, 1940  Today's TOC FU Call Status: Today's TOC FU Call Status:: Unsuccessful Call (3rd Attempt) Unsuccessful Call (3rd Attempt) Date: 08/19/22  Attempted to reach the patient regarding the most recent Inpatient/ED visit.  Follow Up Plan: No further outreach attempts will be made at this time. We have been unable to contact the patient.  [name] BSN RN [facility] [phone]

## 2022-08-19 NOTE — Telephone Encounter (Signed)
Order printed & placed in [place] folder for [employer]

## 2022-08-20 DIAGNOSIS — N1831 Chronic kidney disease, stage 3a: Secondary | ICD-10-CM | POA: Diagnosis not present

## 2022-08-20 DIAGNOSIS — F4323 Adjustment disorder with mixed anxiety and depressed mood: Secondary | ICD-10-CM | POA: Diagnosis not present

## 2022-08-20 DIAGNOSIS — I5043 Acute on chronic combined systolic (congestive) and diastolic (congestive) heart failure: Secondary | ICD-10-CM | POA: Diagnosis not present

## 2022-08-20 DIAGNOSIS — F3160 Bipolar disorder, current episode mixed, unspecified: Secondary | ICD-10-CM | POA: Diagnosis not present

## 2022-08-20 DIAGNOSIS — I2510 Atherosclerotic heart disease of native coronary artery without angina pectoris: Secondary | ICD-10-CM | POA: Diagnosis not present

## 2022-08-20 DIAGNOSIS — D631 Anemia in chronic kidney disease: Secondary | ICD-10-CM | POA: Diagnosis not present

## 2022-08-20 DIAGNOSIS — I480 Paroxysmal atrial fibrillation: Secondary | ICD-10-CM | POA: Diagnosis not present

## 2022-08-20 DIAGNOSIS — I130 Hypertensive heart and chronic kidney disease with heart failure and stage 1 through stage 4 chronic kidney disease, or unspecified chronic kidney disease: Secondary | ICD-10-CM | POA: Diagnosis not present

## 2022-08-20 DIAGNOSIS — M800AXD Age-related osteoporosis with current pathological fracture, other site, subsequent encounter for fracture with routine healing: Secondary | ICD-10-CM | POA: Diagnosis not present

## 2022-08-21 ENCOUNTER — Inpatient Hospital Stay: Payer: Medicare PPO | Admitting: Primary Care

## 2022-08-23 DIAGNOSIS — J441 Chronic obstructive pulmonary disease with (acute) exacerbation: Secondary | ICD-10-CM | POA: Diagnosis not present

## 2022-08-25 DIAGNOSIS — I130 Hypertensive heart and chronic kidney disease with heart failure and stage 1 through stage 4 chronic kidney disease, or unspecified chronic kidney disease: Secondary | ICD-10-CM | POA: Diagnosis not present

## 2022-08-25 DIAGNOSIS — M800AXD Age-related osteoporosis with current pathological fracture, other site, subsequent encounter for fracture with routine healing: Secondary | ICD-10-CM | POA: Diagnosis not present

## 2022-08-25 DIAGNOSIS — F3160 Bipolar disorder, current episode mixed, unspecified: Secondary | ICD-10-CM | POA: Diagnosis not present

## 2022-08-25 DIAGNOSIS — D631 Anemia in chronic kidney disease: Secondary | ICD-10-CM | POA: Diagnosis not present

## 2022-08-25 DIAGNOSIS — F4323 Adjustment disorder with mixed anxiety and depressed mood: Secondary | ICD-10-CM | POA: Diagnosis not present

## 2022-08-25 DIAGNOSIS — I5043 Acute on chronic combined systolic (congestive) and diastolic (congestive) heart failure: Secondary | ICD-10-CM | POA: Diagnosis not present

## 2022-08-25 DIAGNOSIS — N1831 Chronic kidney disease, stage 3a: Secondary | ICD-10-CM | POA: Diagnosis not present

## 2022-08-25 DIAGNOSIS — I480 Paroxysmal atrial fibrillation: Secondary | ICD-10-CM | POA: Diagnosis not present

## 2022-08-25 DIAGNOSIS — A0472 Enterocolitis due to Clostridium difficile, not specified as recurrent: Secondary | ICD-10-CM | POA: Diagnosis not present

## 2022-08-26 ENCOUNTER — Ambulatory Visit: Payer: Medicare PPO | Admitting: Family Medicine

## 2022-08-27 DIAGNOSIS — N1831 Chronic kidney disease, stage 3a: Secondary | ICD-10-CM | POA: Diagnosis not present

## 2022-08-27 DIAGNOSIS — F4323 Adjustment disorder with mixed anxiety and depressed mood: Secondary | ICD-10-CM | POA: Diagnosis not present

## 2022-08-27 DIAGNOSIS — M800AXD Age-related osteoporosis with current pathological fracture, other site, subsequent encounter for fracture with routine healing: Secondary | ICD-10-CM | POA: Diagnosis not present

## 2022-08-27 DIAGNOSIS — D631 Anemia in chronic kidney disease: Secondary | ICD-10-CM | POA: Diagnosis not present

## 2022-08-27 DIAGNOSIS — I480 Paroxysmal atrial fibrillation: Secondary | ICD-10-CM | POA: Diagnosis not present

## 2022-08-27 DIAGNOSIS — F3160 Bipolar disorder, current episode mixed, unspecified: Secondary | ICD-10-CM | POA: Diagnosis not present

## 2022-08-27 DIAGNOSIS — I5043 Acute on chronic combined systolic (congestive) and diastolic (congestive) heart failure: Secondary | ICD-10-CM | POA: Diagnosis not present

## 2022-08-27 DIAGNOSIS — I130 Hypertensive heart and chronic kidney disease with heart failure and stage 1 through stage 4 chronic kidney disease, or unspecified chronic kidney disease: Secondary | ICD-10-CM | POA: Diagnosis not present

## 2022-08-27 DIAGNOSIS — A0472 Enterocolitis due to Clostridium difficile, not specified as recurrent: Secondary | ICD-10-CM | POA: Diagnosis not present

## 2022-08-29 ENCOUNTER — Encounter: Payer: Self-pay | Admitting: Family Medicine

## 2022-08-29 NOTE — Assessment & Plan Note (Addendum)
Chronic. Does not appear overloaded on exam today. Recent hospital admission secondary to volume overload for self decrease in Torsemide dose Symptoms now improved with resumption of regular dose of Torsemide 40 mg daily Has had Cardiology follow up recently and adjustments. Awaiting prescription refill for Eliquis from [facility].   Continue to follow up with Cardiology as scheduled Recommend to not self decrease diuretics Follow up as needed

## 2022-08-29 NOTE — Assessment & Plan Note (Signed)
Refill Protonix 40 mg daily

## 2022-08-31 DIAGNOSIS — H43813 Vitreous degeneration, bilateral: Secondary | ICD-10-CM | POA: Diagnosis not present

## 2022-08-31 DIAGNOSIS — D3131 Benign neoplasm of right choroid: Secondary | ICD-10-CM | POA: Diagnosis not present

## 2022-08-31 DIAGNOSIS — H353211 Exudative age-related macular degeneration, right eye, with active choroidal neovascularization: Secondary | ICD-10-CM | POA: Diagnosis not present

## 2022-08-31 DIAGNOSIS — H04123 Dry eye syndrome of bilateral lacrimal glands: Secondary | ICD-10-CM | POA: Diagnosis not present

## 2022-09-01 DIAGNOSIS — N1831 Chronic kidney disease, stage 3a: Secondary | ICD-10-CM | POA: Diagnosis not present

## 2022-09-01 DIAGNOSIS — M800AXD Age-related osteoporosis with current pathological fracture, other site, subsequent encounter for fracture with routine healing: Secondary | ICD-10-CM | POA: Diagnosis not present

## 2022-09-01 DIAGNOSIS — I130 Hypertensive heart and chronic kidney disease with heart failure and stage 1 through stage 4 chronic kidney disease, or unspecified chronic kidney disease: Secondary | ICD-10-CM | POA: Diagnosis not present

## 2022-09-01 DIAGNOSIS — F4323 Adjustment disorder with mixed anxiety and depressed mood: Secondary | ICD-10-CM | POA: Diagnosis not present

## 2022-09-01 DIAGNOSIS — I480 Paroxysmal atrial fibrillation: Secondary | ICD-10-CM | POA: Diagnosis not present

## 2022-09-01 DIAGNOSIS — A0472 Enterocolitis due to Clostridium difficile, not specified as recurrent: Secondary | ICD-10-CM | POA: Diagnosis not present

## 2022-09-01 DIAGNOSIS — I5043 Acute on chronic combined systolic (congestive) and diastolic (congestive) heart failure: Secondary | ICD-10-CM | POA: Diagnosis not present

## 2022-09-01 DIAGNOSIS — D631 Anemia in chronic kidney disease: Secondary | ICD-10-CM | POA: Diagnosis not present

## 2022-09-01 DIAGNOSIS — F3160 Bipolar disorder, current episode mixed, unspecified: Secondary | ICD-10-CM | POA: Diagnosis not present

## 2022-09-02 ENCOUNTER — Other Ambulatory Visit: Payer: Self-pay | Admitting: Pulmonary Disease

## 2022-09-02 ENCOUNTER — Encounter (INDEPENDENT_AMBULATORY_CARE_PROVIDER_SITE_OTHER): Payer: Self-pay

## 2022-09-03 DIAGNOSIS — I5043 Acute on chronic combined systolic (congestive) and diastolic (congestive) heart failure: Secondary | ICD-10-CM | POA: Diagnosis not present

## 2022-09-03 DIAGNOSIS — I130 Hypertensive heart and chronic kidney disease with heart failure and stage 1 through stage 4 chronic kidney disease, or unspecified chronic kidney disease: Secondary | ICD-10-CM | POA: Diagnosis not present

## 2022-09-03 DIAGNOSIS — F3160 Bipolar disorder, current episode mixed, unspecified: Secondary | ICD-10-CM | POA: Diagnosis not present

## 2022-09-03 DIAGNOSIS — F4323 Adjustment disorder with mixed anxiety and depressed mood: Secondary | ICD-10-CM | POA: Diagnosis not present

## 2022-09-03 DIAGNOSIS — M800AXD Age-related osteoporosis with current pathological fracture, other site, subsequent encounter for fracture with routine healing: Secondary | ICD-10-CM | POA: Diagnosis not present

## 2022-09-03 DIAGNOSIS — D631 Anemia in chronic kidney disease: Secondary | ICD-10-CM | POA: Diagnosis not present

## 2022-09-03 DIAGNOSIS — N1831 Chronic kidney disease, stage 3a: Secondary | ICD-10-CM | POA: Diagnosis not present

## 2022-09-03 DIAGNOSIS — I480 Paroxysmal atrial fibrillation: Secondary | ICD-10-CM | POA: Diagnosis not present

## 2022-09-03 DIAGNOSIS — A0472 Enterocolitis due to Clostridium difficile, not specified as recurrent: Secondary | ICD-10-CM | POA: Diagnosis not present

## 2022-09-07 ENCOUNTER — Ambulatory Visit: Payer: Medicare PPO | Admitting: Student in an Organized Health Care Education/Training Program

## 2022-09-10 DIAGNOSIS — F3160 Bipolar disorder, current episode mixed, unspecified: Secondary | ICD-10-CM | POA: Diagnosis not present

## 2022-09-10 DIAGNOSIS — I480 Paroxysmal atrial fibrillation: Secondary | ICD-10-CM | POA: Diagnosis not present

## 2022-09-10 DIAGNOSIS — N1831 Chronic kidney disease, stage 3a: Secondary | ICD-10-CM | POA: Diagnosis not present

## 2022-09-10 DIAGNOSIS — I130 Hypertensive heart and chronic kidney disease with heart failure and stage 1 through stage 4 chronic kidney disease, or unspecified chronic kidney disease: Secondary | ICD-10-CM | POA: Diagnosis not present

## 2022-09-10 DIAGNOSIS — I5043 Acute on chronic combined systolic (congestive) and diastolic (congestive) heart failure: Secondary | ICD-10-CM | POA: Diagnosis not present

## 2022-09-10 DIAGNOSIS — D631 Anemia in chronic kidney disease: Secondary | ICD-10-CM | POA: Diagnosis not present

## 2022-09-10 DIAGNOSIS — A0472 Enterocolitis due to Clostridium difficile, not specified as recurrent: Secondary | ICD-10-CM | POA: Diagnosis not present

## 2022-09-10 DIAGNOSIS — F4323 Adjustment disorder with mixed anxiety and depressed mood: Secondary | ICD-10-CM | POA: Diagnosis not present

## 2022-09-10 DIAGNOSIS — M800AXD Age-related osteoporosis with current pathological fracture, other site, subsequent encounter for fracture with routine healing: Secondary | ICD-10-CM | POA: Diagnosis not present

## 2022-09-14 ENCOUNTER — Ambulatory Visit
Admission: RE | Admit: 2022-09-14 | Discharge: 2022-09-14 | Disposition: A | Discharge status: Home / Self Care | Payer: Medicare PPO | Source: Ambulatory Visit | Attending: Student in an Organized Health Care Education/Training Program | Admitting: Student in an Organized Health Care Education/Training Program

## 2022-09-14 ENCOUNTER — Encounter: Payer: Self-pay | Admitting: Student in an Organized Health Care Education/Training Program

## 2022-09-14 ENCOUNTER — Ambulatory Visit
Payer: Medicare PPO | Attending: Student in an Organized Health Care Education/Training Program | Admitting: Student in an Organized Health Care Education/Training Program

## 2022-09-14 VITALS — BP 172/79 | HR 73 | Temp 99.1°F | Resp 16 | Ht 59.5 in | Wt 166.0 lb

## 2022-09-14 DIAGNOSIS — G8929 Other chronic pain: Secondary | ICD-10-CM | POA: Insufficient documentation

## 2022-09-14 DIAGNOSIS — M25511 Pain in right shoulder: Secondary | ICD-10-CM | POA: Insufficient documentation

## 2022-09-14 DIAGNOSIS — G894 Chronic pain syndrome: Secondary | ICD-10-CM | POA: Insufficient documentation

## 2022-09-14 DIAGNOSIS — M25512 Pain in left shoulder: Secondary | ICD-10-CM | POA: Diagnosis not present

## 2022-09-14 DIAGNOSIS — M19012 Primary osteoarthritis, left shoulder: Secondary | ICD-10-CM | POA: Insufficient documentation

## 2022-09-14 DIAGNOSIS — M19011 Primary osteoarthritis, right shoulder: Secondary | ICD-10-CM | POA: Insufficient documentation

## 2022-09-14 MED ORDER — LIDOCAINE HCL 2 % IJ SOLN
20.0000 mL | Freq: Once | INTRAMUSCULAR | Status: AC
  Administered 2022-09-14: 400 mg

## 2022-09-14 MED ORDER — METHYLPREDNISOLONE ACETATE 80 MG/ML IJ SUSP
80.0000 mg | Freq: Once | INTRAMUSCULAR | Status: AC
  Administered 2022-09-14: 80 mg

## 2022-09-14 MED ORDER — LIDOCAINE HCL 2 % IJ SOLN
INTRAMUSCULAR | Status: AC
  Filled 2022-09-14: qty 20

## 2022-09-14 MED ORDER — ROPIVACAINE HCL 2 MG/ML IJ SOLN
INTRAMUSCULAR | Status: AC
  Filled 2022-09-14: qty 20

## 2022-09-14 MED ORDER — IOHEXOL 180 MG/ML  SOLN
10.0000 mL | Freq: Once | INTRAMUSCULAR | Status: AC
  Administered 2022-09-14: 10 mL via INTRA_ARTICULAR
  Filled 2022-09-14: qty 20

## 2022-09-14 MED ORDER — ROPIVACAINE HCL 2 MG/ML IJ SOLN
9.0000 mL | Freq: Once | INTRAMUSCULAR | Status: AC
  Administered 2022-09-14: 20 mL via PERINEURAL

## 2022-09-14 MED ORDER — METHYLPREDNISOLONE ACETATE 80 MG/ML IJ SUSP
INTRAMUSCULAR | Status: AC
  Filled 2022-09-14: qty 1

## 2022-09-14 NOTE — Progress Notes (Signed)
PROVIDER NOTE: Interpretation of information contained herein should be left to medically-trained personnel. Specific patient instructions are provided elsewhere under "Patient Instructions" section of medical record. This document was created in part using STT-dictation technology, any transcriptional errors that may result from this process are unintentional.  Patient: Kerri Carter Type: Established DOB: February 13, 1940 MRN: [id number] PCP: Dana Allan, MD  Service: Procedure DOS: 09/14/2022 Setting: Ambulatory Location: Ambulatory outpatient facility Delivery: Face-to-face Provider: Edward Jolly, MD Specialty: Interventional Pain Management Specialty designation: 09 Location: Outpatient facility Ref. Prov.: Dana Allan, MD       Interventional Therapy   Procedure: Suprascapular nerve block (SSNB) #1  Laterality:  Bilateral  Level: Superior to scapular spine, lateral to supraspinatus fossa (Suprascapular notch).  Imaging: Fluoroscopic guidance         Anesthesia: Local anesthesia (1-2% Lidocaine) DOS: 09/14/2022  Performed by: Edward Jolly, MD  Purpose: Diagnostic/Therapeutic Indications: Shoulder pain, severe enough to impact quality of life and/or function. 1. Chronic pain of both shoulders   2. Primary osteoarthritis of both shoulders   3. Chronic pain syndrome    NAS-11 score:   Pre-procedure: 7 /10   Post-procedure: 2 /10     Target: Suprascapular nerve Location: midway between the medial border of the scapula and the acromion as it runs through the suprascapular notch. Region: Suprascapular, posterior shoulder  Approach: Percutaneous  Neuroanatomy: The suprascapular nerve is the lateral branch of the superior trunk of the brachial plexus. It receives nerve fibers that originate in the nerve roots C5 and C6 (and sometimes C4). It is a mixed nerve, meaning that it provides both sensory and motor supply for the suprascapular region. Function: The main function of this nerve is  to provide motor innervation for two muscles, the supraspinatus and infraspinatus muscles. They are part of the rotator cuff muscles. In addition, the suprascapular nerve provides a sensory supply to the joints of the scapula (glenohumeral and acromioclavicular joints). Rationale (medical necessity): procedure needed and proper for the diagnosis and/or treatment of the patient's medical symptoms and needs.  Position / Prep / Materials:  Position: Prone Materials:  Tray: Block Needle(s):  Type: Spinal  Gauge (G): 22  Length: 3.5 in.  Qty: 1 Prep solution: DuraPrep (Iodine Povacrylex [0.7% available iodine] and Isopropyl Alcohol, 74% w/w) Prep Area: Entire posterior shoulder area. From upper spine to shoulder proper (upper arm), and from lateral neck to lower tip of shoulder blade.   H&P (Pre-op Assessment):  Kerri Carter is a 82 y.o. (year old), female patient, seen today for interventional treatment. She  has a past surgical history that includes Nephrectomy (1988); Tubal ligation; Appendectomy; Tonsillectomy; Cesarean section; Bunionectomy (Right); Total hip arthroplasty (12/10/11); Colonoscopy with propofol (N/A, 04/02/2016); Esophagogastroduodenoscopy (egd) with propofol (N/A, 04/02/2016); Esophageal dilation (04/02/2016); Endoscopic retrograde cholangiopancreatography (ercp) with propofol (N/A, 05/08/2016); Cholecystectomy (N/A, 05/11/2016); Hip Arthroplasty (Right, 11/01/2016); Replacement total knee (Right); Total hip arthroplasty (Bilateral); Cataract extraction, bilateral; osteoporosis; ERCP; Reverse shoulder arthroplasty (Right, 11/04/2017); Reverse shoulder arthroplasty (Left, 07/26/2018); Back surgery; ORIF scaphoid fracture (Left, 12/21/2019); Lumbar laminectomy/decompression microdiscectomy (N/A, 12/11/2020); Transforaminal lumbar interbody fusion (tlif) with pedicle screw fixation 3 level (N/A, 05/23/2021); and Application of intraoperative CT scan (N/A, 05/23/2021). Kerri Carter has a current medication  list which includes the following prescription(s): albuterol, amlodipine, apixaban, buspirone, cetirizine, dicyclomine, enbrel sureclick, escitalopram, trelegy ellipta, gabapentin, lamotrigine, leflunomide, lidocaine, lovastatin, methocarbamol, mometasone, montelukast, multivitamin-lutein, nebivolol, ondansetron, oxygen-helium, pantoprazole, prednisone, prolia, quetiapine, sucralfate, torsemide, tramadol, vancomycin, gemtesa, and ipratropium-albuterol. Her primarily concern today is the Shoulder Pain (  bilat)  Initial Vital Signs:  Pulse/HCG Rate: 73ECG Heart Rate: 88 Temp: 99.1 F (37.3 C) Resp: 17 BP: (!) 153/66 SpO2: 98 % (on 3L O2)  BMI: Estimated body mass index is 32.97 kg/m as calculated from the following:   Height as of this encounter: 4' 11.5" (1.511 m).   Weight as of this encounter: 166 lb (75.3 kg).  Risk Assessment: Allergies: Reviewed. She is allergic to ceftin [cefuroxime axetil]; lisinopril; sulfa antibiotics; sulfasalazine; morphine; xarelto [rivaroxaban]; adhesive [tape]; antihistamines, chlorpheniramine-type; antivert [meclizine hcl]; aspirin; contrast media [iodinated contrast media]; decongestant [pseudoephedrine hcl]; doxycycline; levaquin [levofloxacin in d5w]; polymyxin b; and tetanus toxoids.  Allergy Precautions: None required Coagulopathies: Reviewed. None identified.  Blood-thinner therapy: None at this time Active Infection(s): Reviewed. None identified. Kerri Carter is afebrile  Site Confirmation: Kerri Carter was asked to confirm the procedure and laterality before marking the site Procedure checklist: Completed Consent: Before the procedure and under the influence of no sedative(s), amnesic(s), or anxiolytics, the patient was informed of the treatment options, risks and possible complications. To fulfill our ethical and legal obligations, as recommended by the American Medical Association's Code of Ethics, I have informed the patient of my clinical impression; the  nature and purpose of the treatment or procedure; the risks, benefits, and possible complications of the intervention; the alternatives, including doing nothing; the risk(s) and benefit(s) of the alternative treatment(s) or procedure(s); and the risk(s) and benefit(s) of doing nothing. The patient was provided information about the general risks and possible complications associated with the procedure. These may include, but are not limited to: failure to achieve desired goals, infection, bleeding, organ or nerve damage, allergic reactions, paralysis, and death. In addition, the patient was informed of those risks and complications associated to the procedure, such as failure to decrease pain; infection; bleeding; organ or nerve damage with subsequent damage to sensory, motor, and/or autonomic systems, resulting in permanent pain, numbness, and/or weakness of one or several areas of the body; allergic reactions; (i.e.: anaphylactic reaction); and/or death. Furthermore, the patient was informed of those risks and complications associated with the medications. These include, but are not limited to: allergic reactions (i.e.: anaphylactic or anaphylactoid reaction(s)); adrenal axis suppression; blood sugar elevation that in diabetics may result in ketoacidosis or comma; water retention that in patients with history of congestive heart failure may result in shortness of breath, pulmonary edema, and decompensation with resultant heart failure; weight gain; swelling or edema; medication-induced neural toxicity; particulate matter embolism and blood vessel occlusion with resultant organ, and/or nervous system infarction; and/or aseptic necrosis of one or more joints. Finally, the patient was informed that Medicine is not an exact science; therefore, there is also the possibility of unforeseen or unpredictable risks and/or possible complications that may result in a catastrophic outcome. The patient indicated having  understood very clearly. We have given the patient no guarantees and we have made no promises. Enough time was given to the patient to ask questions, all of which were answered to the patient's satisfaction. Ms. Alderette has indicated that she wanted to continue with the procedure. Attestation: I, the ordering provider, attest that I have discussed with the patient the benefits, risks, side-effects, alternatives, likelihood of achieving goals, and potential problems during recovery for the procedure that I have provided informed consent. Date  Time: 09/14/2022  1:16 PM  Pre-Procedure Preparation:  Monitoring: As per clinic protocol. Respiration, ETCO2, SpO2, BP, heart rate and rhythm monitor placed and checked for adequate function Safety Precautions: Patient was  assessed for positional comfort and pressure points before starting the procedure. Time-out: I initiated and conducted the "Time-out" before starting the procedure, as per protocol. The patient was asked to participate by confirming the accuracy of the "Time Out" information. Verification of the correct person, site, and procedure were performed and confirmed by me, the nursing staff, and the patient. "Time-out" conducted as per Joint Commission's Universal Protocol (UP.01.01.01). Time: 1348 Start Time: 1315 hrs.  Description of Procedure:          Procedural Technique Safety Precautions: Aspiration looking for blood return was conducted prior to all injections. At no point did we inject any substances, as a needle was being advanced. No attempts were made at seeking any paresthesias. Safe injection practices and needle disposal techniques used. Medications properly checked for expiration dates. SDV (single dose vial) medications used. Description of the Procedure: Protocol guidelines were followed. The patient was placed in position over the procedure table. The target area was identified and the area prepped in the usual manner. Skin & deeper  tissues infiltrated with local anesthetic. Appropriate amount of time allowed to pass for local anesthetics to take effect. The procedure needles were then advanced to the target area. Proper needle placement secured. Negative aspiration confirmed. Solution injected in intermittent fashion, asking for systemic symptoms every 0.5cc of injectate. The needles were then removed and the area cleansed, making sure to leave some of the prepping solution back to take advantage of its long term bactericidal properties. 10 cc solution made of 9 cc of 0.2% Ropivacaine and 1 cc of methylprednisolone 80 mg/cc, 5 cc injected for left SSN, 5 cc injected for right SSN   Vitals:   09/14/22 1321 09/14/22 1349 09/14/22 1353  BP: (!) 153/66 (!) 167/82 (!) 172/79  Pulse: 73    Resp: 17 14 16   Temp: 99.1 F (37.3 C)    SpO2: 98% 100% 99%  Weight: 166 lb (75.3 kg)    Height: 4' 11.5" (1.511 m)       Start Time: 1315 hrs. End Time: 1352 hrs.  Imaging Guidance (Spinal):          Type of Imaging Technique: Fluoroscopy Guidance (Spinal) Indication(s): Assistance in needle guidance and placement for procedures requiring needle placement in or near specific anatomical locations not easily accessible without such assistance. Exposure Time: Please see nurses notes. Contrast: None used. Fluoroscopic Guidance: I was personally present during the use of fluoroscopy. "Tunnel Vision Technique" used to obtain the best possible view of the target area. Parallax error corrected before commencing the procedure. "Direction-depth-direction" technique used to introduce the needle under continuous pulsed fluoroscopy. Once target was reached, antero-posterior, oblique, and lateral fluoroscopic projection used confirm needle placement in all planes. Images permanently stored in EMR. Interpretation: No contrast injected. I personally interpreted the imaging intraoperatively. Adequate needle placement confirmed in multiple planes.  Permanent images saved into the patient's record.  Antibiotic Prophylaxis:   Anti-infectives (From admission, onward)    None      Indication(s): None identified  Post-operative Assessment:  Post-procedure Vital Signs:  Pulse/HCG Rate: 7383 Temp: 99.1 F (37.3 C) Resp: 16 BP: (!) 172/79 SpO2: 99 %  EBL: None  Complications: No immediate post-treatment complications observed by team, or reported by patient.  Note: The patient tolerated the entire procedure well. A repeat set of vitals were taken after the procedure and the patient was kept under observation following institutional policy, for this type of procedure. Post-procedural neurological assessment was performed, showing return  to baseline, prior to discharge. The patient was provided with post-procedure discharge instructions, including a section on how to identify potential problems. Should any problems arise concerning this procedure, the patient was given instructions to immediately contact us, at any time, without hesitation. In any case, we plan to contact the patient by telephone for a follow-up status report regarding this interventional procedure.  Comments:  No additional relevant information.  Plan of Care (POC)  Orders:  Orders Placed This Encounter  Procedures   DG PAIN CLINIC C-ARM 1-60 MIN NO REPORT    Intraoperative interpretation by procedural physician at [facility].    Standing Status:   Standing    Number of Occurrences:   1    Order Specific Question:   Reason for exam:    Answer:   Assistance in needle guidance and placement for procedures requiring needle placement in or near specific anatomical locations not easily accessible without such assistance.     Medications ordered for procedure: Meds ordered this encounter  Medications   iohexol (OMNIPAQUE) 180 MG/ML injection 10 mL    Must be Myelogram-compatible. If not available, you may substitute with a water-soluble, non-ionic,  hypoallergenic, myelogram-compatible radiological contrast medium.   lidocaine (XYLOCAINE) 2 % (with pres) injection 400 mg   methylPREDNISolone acetate (DEPO-MEDROL) injection 80 mg   ropivacaine (PF) 2 mg/mL (0.2%) (NAROPIN) injection 9 mL   Medications administered: We administered iohexol, lidocaine, methylPREDNISolone acetate, and ropivacaine (PF) 2 mg/mL (0.2%).  See the medical record for exact dosing, route, and time of administration.  Follow-up plan:   Return in about 4 weeks (around 10/12/2022) for PPE in person.       Recent Visits Date Type Provider Dept  07/28/22 Office Visit Edward Jolly, MD [facility]  Showing recent visits within past 90 days and meeting all other requirements Today's Visits Date Type Provider Dept  09/14/22 Procedure visit Edward Jolly, MD [facility]  Showing today's visits and meeting all other requirements Future Appointments No visits were found meeting these conditions. Showing future appointments within next 90 days and meeting all other requirements  Disposition: Discharge home  Discharge (Date  Time): 09/14/2022; 1400 hrs.   Primary Care Physician: Dana Allan, MD Location: [facility] Note by: Edward Jolly, MD (TTS technology used. I apologize for any typographical errors that were not detected and corrected.) Date: 09/14/2022; Time: 2:01 PM  Disclaimer:  Medicine is not an Visual merchandiser. The only guarantee in medicine is that nothing is guaranteed. It is important to note that the decision to proceed with this intervention was based on the information collected from the patient. The Data and conclusions were drawn from the patient's questionnaire, the interview, and the physical examination. Because the information was provided in large part by the patient, it cannot be guaranteed that it has not been purposely or unconsciously manipulated. Every effort has been made to obtain as much  relevant data as possible for this evaluation. It is important to note that the conclusions that lead to this procedure are derived in large part from the available data. Always take into account that the treatment will also be dependent on availability of resources and existing treatment guidelines, considered by other Pain Management Practitioners as being common knowledge and practice, at the time of the intervention. For Medico-Legal purposes, it is also important to point out that variation in procedural techniques and pharmacological choices are the acceptable norm. The indications, contraindications, technique, and results  of the above procedure should only be interpreted and judged by a Board-Certified Interventional Pain Specialist with extensive familiarity and expertise in the same exact procedure and technique.

## 2022-09-14 NOTE — Progress Notes (Signed)
Safety precautions to be maintained throughout the outpatient stay will include: orient to surroundings, keep bed in low position, maintain call bell within reach at all times, provide assistance with transfer out of bed and ambulation.  

## 2022-09-14 NOTE — Patient Instructions (Signed)

## 2022-09-15 ENCOUNTER — Ambulatory Visit: Payer: Medicare PPO | Admitting: Gastroenterology

## 2022-09-15 ENCOUNTER — Telehealth: Payer: Self-pay

## 2022-09-15 ENCOUNTER — Encounter: Payer: Self-pay | Admitting: Gastroenterology

## 2022-09-15 VITALS — BP 140/71 | HR 88 | Temp 97.9°F | Wt 172.0 lb

## 2022-09-15 DIAGNOSIS — D631 Anemia in chronic kidney disease: Secondary | ICD-10-CM | POA: Diagnosis not present

## 2022-09-15 DIAGNOSIS — F4323 Adjustment disorder with mixed anxiety and depressed mood: Secondary | ICD-10-CM | POA: Diagnosis not present

## 2022-09-15 DIAGNOSIS — R1030 Lower abdominal pain, unspecified: Secondary | ICD-10-CM

## 2022-09-15 DIAGNOSIS — A0472 Enterocolitis due to Clostridium difficile, not specified as recurrent: Secondary | ICD-10-CM | POA: Diagnosis not present

## 2022-09-15 DIAGNOSIS — M800AXD Age-related osteoporosis with current pathological fracture, other site, subsequent encounter for fracture with routine healing: Secondary | ICD-10-CM | POA: Diagnosis not present

## 2022-09-15 DIAGNOSIS — N1831 Chronic kidney disease, stage 3a: Secondary | ICD-10-CM | POA: Diagnosis not present

## 2022-09-15 DIAGNOSIS — I480 Paroxysmal atrial fibrillation: Secondary | ICD-10-CM | POA: Diagnosis not present

## 2022-09-15 DIAGNOSIS — I5043 Acute on chronic combined systolic (congestive) and diastolic (congestive) heart failure: Secondary | ICD-10-CM | POA: Diagnosis not present

## 2022-09-15 DIAGNOSIS — I130 Hypertensive heart and chronic kidney disease with heart failure and stage 1 through stage 4 chronic kidney disease, or unspecified chronic kidney disease: Secondary | ICD-10-CM | POA: Diagnosis not present

## 2022-09-15 DIAGNOSIS — K588 Other irritable bowel syndrome: Secondary | ICD-10-CM

## 2022-09-15 DIAGNOSIS — F3160 Bipolar disorder, current episode mixed, unspecified: Secondary | ICD-10-CM | POA: Diagnosis not present

## 2022-09-15 NOTE — Telephone Encounter (Signed)
Post procedure follow up.  Patient states she is doing good.  

## 2022-09-15 NOTE — Progress Notes (Unsigned)
Wyline Mood MD, MRCP(U.K) [address]  [address]  [place], [place] [address]  Main: [phone]  Fax: [phone]   Primary Care Physician: Dana Allan, MD  Primary Gastroenterologist:  Dr. Wyline Mood   Chief Complaint  Patient presents with   Abdominal Pain    HPI: Kerri Carter is a 82 y.o. female Summary of history :   Was initially seen on 10/24/2018.She has previously seen Dr Markham Jordan at [facility] ,h/o dysphagia, diarrhea, iron deficiency anemia due to small bowel AVM's .  In 2018 she was seen by myself for abdominal pain and anemia.  A CT scan of the abdomen at that point of time showed an adnexal lesion. LFT's in 02/2014 showed an elevated alkaline phosphatase , normal lipase and Hb 11 grams . Iron studies normal .EGD/colonoscopy 04/2016 ,EGD - mild chronic duodenitis on bx , normal villi. Gastric biopsies showed erosive gastritis , normal esophageal biopsies. Random colon biopsies showed mild active colitis., 3 tubular adenomas were excised. Celiac serology was negative , TSH normal  07/27/2018: Hemoglobin 11.3 g.  09/30/2018 hemoglobin 12.4 g.    10/24/2018 she states that she had abdominal pain for 6 months which is cramping in nature, nonradiating, on and off, preceded by bowel movement and relieved subsequently.  Occasionally also has pain after bowel movement.  Stool is the consistency of pudding.  Has a bowel movement daily.    Had significant improvement with Bentyl and fiber pills   10/09/2021: Hb 11.2 , mcv 82, cr 1.33, ferritin 142     09/09/2021: Hb 6.9 grams with mcv 75 when admitted with COPD exaceberation , CHF, ILD . Toe fracture. No frank bleeding seen , Was on eloquis.     Interval history 11/04/2021-09/15/2022   08/10/2022 CT abdomen pelvis for abdominal pain abdominal wall hernia ovarian cyst prior right nephrectomy Seen for abdominal pain  08/12/2022 stool positive for C. difficile toxin negative antigen positive Admitted in July 2024 for nausea vomiting  abdominal pain with loose stools the diarrhea resolved and was having formed stools discharged home. Since discharge the diarrhea has resolved she does have pain before a bowel movement crampy lower abdominal pain.  Taking fiber pills for constipation.  Denies any constipation at this point of time.  Current Outpatient Medications  Medication Sig Dispense Refill   albuterol (VENTOLIN HFA) 108 (90 Base) MCG/ACT inhaler Inhale 2 puffs into the lungs every 6 (six) hours as needed for wheezing or shortness of breath. 18 g 11   amLODipine (NORVASC) 5 MG tablet Take 1 tablet (5 mg total) by mouth daily. (Patient taking differently: Take 5 mg by mouth in the morning and at bedtime.) 90 tablet 3   apixaban (ELIQUIS) 5 MG TABS tablet Take 1 tablet (5 mg total) by mouth 2 (two) times daily. 180 tablet 1   busPIRone (BUSPAR) 5 MG tablet Take 1 tablet (5 mg total) by mouth 2 (two) times daily.     cetirizine (ZYRTEC) 10 MG tablet TAKE 1 TABLET BY MOUTH EVERYDAY AT BEDTIME 90 tablet 3   dicyclomine (BENTYL) 10 MG capsule Take 1 capsule (10 mg total) by mouth 3 (three) times daily before meals. (Patient taking differently: Take 10 mg by mouth 2 (two) times daily as needed.) 90 capsule 0   ENBREL SURECLICK 50 MG/ML injection Inject 50 mg into the skin once a week. Takes on Fridays     escitalopram (LEXAPRO) 10 MG tablet Take 1 tablet (10 mg total) by mouth daily.  90 tablet 3   Fluticasone-Umeclidin-Vilant (TRELEGY ELLIPTA) 100-62.5-25 MCG/ACT AEPB Inhale 1 puff into the lungs daily. 60 each 6   gabapentin (NEURONTIN) 300 MG capsule Take 300 mg in the morning and 600 mg at night. (Patient taking differently: Take 600 mg by mouth 2 (two) times daily. Take 300 mg in the morning and 600 mg at night.)     ipratropium-albuterol (DUONEB) 0.5-2.5 (3) MG/3ML SOLN SMARTSIG:3 Milliliter(s) Via Nebulizer Every 6 Hours PRN (Patient not taking: Reported on 09/14/2022)     lamoTRIgine (LAMICTAL) 100 MG tablet TAKE 1 TAB 2 TIMES  DAILY. FURTHER REFILLS NEW PSYCH FOR ALL PSYCH MEDS ONLY TEMP SUPPLY FROM PCP 180 tablet 1   leflunomide (ARAVA) 20 MG tablet Take 1 tablet (20 mg total) by mouth daily.     lidocaine (LIDODERM) 5 % Place 1 patch onto the skin 2 (two) times daily as needed. Remove & Discard patch within 12 hours or as directed by MD 120 patch 2   lovastatin (MEVACOR) 20 MG tablet Take 1 tablet (20 mg total) by mouth daily at 6 PM. 90 tablet 3   methocarbamol (ROBAXIN) 500 MG tablet Take 500 mg by mouth 4 (four) times daily as needed for muscle spasms. Taking twice a day currently     mometasone (NASONEX) 50 MCG/ACT nasal spray 1 spray to each nostril twice a day 1 each 3   montelukast (SINGULAIR) 10 MG tablet Take 1 tablet (10 mg total) by mouth daily. 30 tablet 2   multivitamin-lutein (OCUVITE-LUTEIN) CAPS capsule Take 1 capsule by mouth at bedtime.     nebivolol (BYSTOLIC) 5 MG tablet Take 1 tablet (5 mg total) by mouth in the morning and at bedtime. (Note dose changed from 1/2 10 mg bid to 5 mg bid) 180 tablet 3   ondansetron (ZOFRAN) 4 MG tablet Take 1 tablet (4 mg total) by mouth every 6 (six) hours as needed for nausea. 20 tablet 0   OXYGEN Inhale 3 L into the lungs continuous.     pantoprazole (PROTONIX) 40 MG tablet TAKE 1 TABLET BY MOUTH 2 TIMES DAILY 30 MIN BEFORE FOOD (NOTE REDUCTION IN FREQUENCY) 180 tablet 1   predniSONE (DELTASONE) 5 MG tablet TAKE 1 TABLET BY MOUTH EVERY DAY WITH BREAKFAST 30 tablet 3   PROLIA 60 MG/ML SOSY injection Inject 60 mg into the skin every 6 (six) months.     QUEtiapine (SEROQUEL) 25 MG tablet TAKE 1 TABLET (25 MG TOTAL) BY MOUTH AT BEDTIME. AGAIN LAST FILL FURTHER REFILLS FROM PSYCHIATRY NO EXCEPTIONS 90 tablet 0   sucralfate (CARAFATE) 1 g tablet TAKE 1 TABLET BY MOUTH 2 TIMES DAILY. 180 tablet 2   torsemide (DEMADEX) 20 MG tablet Take 2 tablets (40 mg total) by mouth daily. 60 tablet 0   traMADol (ULTRAM) 50 MG tablet Take 1 tablet (50 mg total) by mouth every 12 (twelve)  hours as needed. 60 tablet 1   vancomycin (VANCOCIN) 125 MG capsule Take 1 capsule (125 mg total) by mouth 4 (four) times daily for 9 days, THEN 1 capsule (125 mg total) 2 (two) times daily for 7 days, THEN 1 capsule (125 mg total) daily for 7 days, THEN 1 capsule (125 mg total) every other day for 7 days, THEN 1 capsule (125 mg total) every 3 (three) days for 7 days. 63 capsule 0   Vibegron (GEMTESA) 75 MG TABS Take 75 mg by mouth daily. 90 tablet 3   No current facility-administered medications for this visit.  Allergies as of 09/15/2022 - Review Complete 09/14/2022  Allergen Reaction Noted   Ceftin [cefuroxime axetil] Anaphylaxis 09/08/2011   Lisinopril Anaphylaxis 09/08/2011   Sulfa antibiotics Other (See Comments) 04/25/2014   Sulfasalazine Anaphylaxis 03/08/2014   Morphine Other (See Comments) 02/25/2012   Xarelto [rivaroxaban] Other (See Comments) 01/19/2012   Adhesive [tape] Rash 09/08/2011   Antihistamines, chlorpheniramine-type Other (See Comments) 09/08/2011   Antivert [meclizine hcl] Other (See Comments) 09/08/2011   Aspirin Other (See Comments) 09/15/2016   Contrast media [iodinated contrast media] Rash 12/07/2019   Decongestant [pseudoephedrine hcl] Other (See Comments) 09/08/2011   Doxycycline Other (See Comments) 09/08/2011   Levaquin [levofloxacin in d5w] Rash 09/08/2011   Polymyxin b Other (See Comments) 02/28/2016   Tetanus toxoids Rash and Other (See Comments) 09/08/2011       ROS:  General: Negative for anorexia, weight loss, fever, chills, fatigue, weakness. ENT: Negative for hoarseness, difficulty swallowing , nasal congestion. CV: Negative for chest pain, angina, palpitations, dyspnea on exertion, peripheral edema.  Respiratory: Negative for dyspnea at rest, dyspnea on exertion, cough, sputum, wheezing.  GI: See history of present illness. GU:  Negative for dysuria, hematuria, urinary incontinence, urinary frequency, nocturnal urination.  Endo: Negative  for unusual weight change.    Physical Examination:   There were no vitals taken for this visit.  General: Well-nourished, well-developed in no acute distress.  Eyes: No icterus. Conjunctivae pink. Neuro: Alert and oriented x 3.  Grossly intact. Skin: Warm and dry, no jaundice.   Psych: Alert and cooperative, normal mood and affect.   Imaging Studies: DG PAIN CLINIC C-ARM 1-60 MIN NO REPORT  Result Date: 09/14/2022 Fluoro was used, but no Radiologist interpretation will be provided. Please refer to "NOTES" tab for provider progress note.   Assessment and Plan:   Kerri Carter is a 82 y.o. y/o female  with a prior history of abdominal pain felt to be of irritable bowel syndrome in nature.  History of severe COPD on oxygen.She has a history of small bowel AVMs has been on Eliquis likely had bled at that time presently denies any overt bleeding.  Further evaluation with GI endoscopy evaluation has been withheld due to high risk. Last visit she was having issues with constipation and plan was to commence her on MiraLAX Presently she has lower abdominal pain prior to bowel movement relieved after likely has postinfectious irritable bowel syndrome.  She is already on fiber supplement.  Will give her a sample of IBgard to be used as needed.  Explained she is very high risk and no procedures to be performed unless absolutely required and at this point of time I do not believe she requires any procedures    Dr Wyline Mood  MD,MRCP [facility]) Follow up in as needed

## 2022-09-17 ENCOUNTER — Encounter: Payer: Self-pay | Admitting: Pulmonary Disease

## 2022-09-17 ENCOUNTER — Ambulatory Visit: Payer: Medicare PPO | Admitting: Pulmonary Disease

## 2022-09-17 VITALS — BP 120/78 | HR 66 | Temp 98.3°F | Ht 59.5 in | Wt 173.0 lb

## 2022-09-17 DIAGNOSIS — J9611 Chronic respiratory failure with hypoxia: Secondary | ICD-10-CM | POA: Diagnosis not present

## 2022-09-17 DIAGNOSIS — J8489 Other specified interstitial pulmonary diseases: Secondary | ICD-10-CM | POA: Diagnosis not present

## 2022-09-17 DIAGNOSIS — J4489 Other specified chronic obstructive pulmonary disease: Secondary | ICD-10-CM

## 2022-09-17 DIAGNOSIS — I080 Rheumatic disorders of both mitral and aortic valves: Secondary | ICD-10-CM

## 2022-09-17 DIAGNOSIS — M0579 Rheumatoid arthritis with rheumatoid factor of multiple sites without organ or systems involvement: Secondary | ICD-10-CM

## 2022-09-17 NOTE — Patient Instructions (Signed)
Your lungs sounded good today.  Continue taking Trelegy as you are doing.  We will see you in follow-up in 6 to 8 weeks time call sooner should any new problems arise.

## 2022-09-17 NOTE — Progress Notes (Signed)
Subjective:    Patient ID: Kerri Carter, female    DOB: 04-10-1940, 82 y.o.   MRN: [id number]  Patient Care Team: Dana Allan, MD as PCP - General (Family Medicine) Otho Ket, RN as [facility] Care Management Salena Saner, MD as Consulting Physician (Pulmonary Disease)  Chief Complaint  Patient presents with   Follow-up    DOE. No wheezing. Cough with clear sputum.   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  This is a scheduled visit.  I last saw her on 28 May 2022 at that time she was given a trial of Breztri at her request.  She could not however use the inhaler well due to her rheumatoid arthritis and also insurance would not cover it.  She was willing to give Trelegy another try (failed previously) and she was switched in June after a phone visit.  Since that time she states that she has been doing well with the Trelegy.  She actually feels that she can breathe better.  Since her April visit she has had decompensation of congestive heart failure that required on admission on 13 June and another admission in July due to acute kidney injury and intractable nausea and vomiting.  He also had C. difficile.  She eventually recovered and was able to be discharged to home.  Her dyspnea has been markedly improved on the Trelegy.  She has not had to use her as needed nebulizer.  She uses albuterol MDI rarely since starting the Trelegy.    Since her hospital discharge she has not had any fevers, chills or sweats.  Cough has been controlled with the Trelegy.  Occasional cough with clear sputum produced.  No hemoptysis.  She has not had any wheezing.  No chest pain, orthopnea or paroxysmal nocturnal dyspnea.  She has had some lower extremity edema.  She has known diastolic heart failure and mitral regurgitation.  She is compliant with oxygen at 3 L/min: Notes  benefit of therapy.   Review of Systems A 10 point review of systems was performed and it is as noted above otherwise negative.   Patient Active Problem List   Diagnosis Date Noted   AKI (acute kidney injury) (HCC) 08/10/2022   Intractable nausea and vomiting 08/10/2022   Gastroenteritis 08/10/2022   Pain management contract signed 07/28/2022   Primary osteoarthritis of both shoulders 07/28/2022   Acute CHF (congestive heart failure) (HCC) 07/18/2022   Fluid overload 07/16/2022   Right rib fracture 07/16/2022   Oral candida 05/17/2022   Hyperparathyroidism due to renal insufficiency (HCC) 04/23/2022   Long term (current) use of anticoagulants 04/08/2022   Obesity (BMI 30-39.9) 03/31/2022   NSVT (nonsustained ventricular tachycardia) (HCC) 03/30/2022   Acute urinary retention 03/30/2022   Sepsis due to pneumonia (HCC) 03/28/2022   Demand ischemia 03/28/2022   History of major orthopedic surgery 03/19/2022   Iron deficiency anemia due to chronic blood loss 10/09/2021   Chronic heart failure with preserved ejection fraction (HCC) 09/24/2021   Acute on chronic respiratory failure with hypoxia and hypercapnia (HCC) 09/10/2021   Acute on chronic diastolic CHF (congestive heart failure) (HCC) 08/20/2021   Paroxysmal atrial fibrillation (HCC) 07/29/2021   Chronic kidney disease, stage 3a (HCC) 07/23/2021   Hypertensive heart and chronic kidney disease with heart failure and stage 1 through stage 4 chronic kidney disease, or unspecified chronic kidney disease (  HCC) 06/02/2021   Irritable bowel syndrome without diarrhea 06/02/2021   Vitamin D deficiency, unspecified 06/02/2021   History of lumbar fusion 05/23/2021   Impaired mobility 12/18/2020   Urinary incontinence 12/18/2020   Lumbar radiculopathy 12/11/2020   Bipolar disorder, current episode mixed, unspecified (HCC) 11/04/2020   Major depressive disorder, single episode, in full remission (HCC) 11/04/2020   Oxygen dependent  11/04/2020   Chronic pain of both shoulders 08/29/2019   Hypertriglyceridemia 07/24/2019   Bipolar disorder, in full remission, most recent episode depressed (HCC) 07/13/2019   Essential hypertension 06/16/2019   Chronic pain 06/16/2019   Lymphedema 06/05/2019   Chronic venous insufficiency 05/25/2019   PAD (peripheral artery disease) (HCC) 05/25/2019   Interstitial pulmonary disease (HCC) 07/07/2018   Leukocytosis 10/19/2017   Allergic rhinitis 09/28/2017   Age-related osteoporosis without current pathological fracture 11/05/2016   Peripheral neuropathy 10/28/2016   Personal history of nicotine dependence 09/10/2016   Osteoporosis 08/27/2016   Drug-induced osteoporosis 08/27/2016   Stage 3b chronic kidney disease (HCC) 08/25/2016   Adnexal mass 08/25/2016   Prediabetes 08/25/2016   Coronary atherosclerosis 08/25/2016   Hyperlipidemia 07/17/2015   Macular degeneration 07/17/2015   Rheumatoid arthritis (HCC) 12/05/2014   Osteoarthritis of knee 06/07/2013   Shortness of breath 10/13/2011   Valvular heart disease 10/13/2011   COPD with acute exacerbation (HCC) 09/09/2011   OSA (obstructive sleep apnea) 09/09/2011   Gastroesophageal reflux disease 02/24/2011   Acquired absence of kidney 02/24/2011   Renal artery stenosis (HCC) 02/24/2011    Social History   Tobacco Use   Smoking status: Former    Current packs/day: 0.00    Average packs/day: 0.5 packs/day for 20.0 years (10.0 ttl pk-yrs)    Types: Cigarettes    Start date: 02/02/1954    Quit date: 02/02/1974    Years since quitting: 48.6   Smokeless tobacco: Never  Substance Use Topics   Alcohol use: No    Allergies  Allergen Reactions   Ceftin [Cefuroxime Axetil] Anaphylaxis   Lisinopril Anaphylaxis   Sulfa Antibiotics Other (See Comments)    Face swelling   Sulfasalazine Anaphylaxis   Morphine Other (See Comments)    Per patient, low blood pressure issues that requires action to raise it back up. Can take small  infrequent doses   Xarelto [Rivaroxaban] Other (See Comments)    Stomach burning, bleeding, and tar in stool   Adhesive [Tape] Rash    Paper tape and tega derm OK   Antihistamines, Chlorpheniramine-Type Other (See Comments)    Makes pt hyper   Antivert [Meclizine Hcl] Other (See Comments)    Bladder will not empty   Aspirin Other (See Comments)    Sulfasalazine allergy cross reacts   Contrast Media [Iodinated Contrast Media] Rash    she is able to use betadine scrubs.   Decongestant [Pseudoephedrine Hcl] Other (See Comments)    Makes pt hyper   Doxycycline Other (See Comments)    GI upset   Levaquin [Levofloxacin In D5w] Rash   Polymyxin B Other (See Comments)    Facial rash   Tetanus Toxoids Rash and Other (See Comments)    Fever and hot to touch at injection site    Current Meds  Medication Sig   albuterol (VENTOLIN HFA) 108 (90 Base) MCG/ACT inhaler Inhale 2 puffs into the lungs every 6 (six) hours as needed for wheezing or shortness of breath.   amLODipine (NORVASC) 5 MG tablet Take 1 tablet (5 mg total) by mouth daily. (Patient taking differently:  Take 5 mg by mouth in the morning and at bedtime.)   apixaban (ELIQUIS) 5 MG TABS tablet Take 1 tablet (5 mg total) by mouth 2 (two) times daily.   busPIRone (BUSPAR) 5 MG tablet Take 1 tablet (5 mg total) by mouth 2 (two) times daily.   cetirizine (ZYRTEC) 10 MG tablet TAKE 1 TABLET BY MOUTH EVERYDAY AT BEDTIME   ENBREL SURECLICK 50 MG/ML injection Inject 50 mg into the skin once a week. Takes on Fridays   escitalopram (LEXAPRO) 10 MG tablet Take 1 tablet (10 mg total) by mouth daily.   Fluticasone-Umeclidin-Vilant (TRELEGY ELLIPTA) 100-62.5-25 MCG/ACT AEPB Inhale 1 puff into the lungs daily.   gabapentin (NEURONTIN) 300 MG capsule Take 300 mg in the morning and 600 mg at night. (Patient taking differently: Take 600 mg by mouth 2 (two) times daily. Take 300 mg in the morning and 600 mg at night.)   ipratropium-albuterol (DUONEB)  0.5-2.5 (3) MG/3ML SOLN    lamoTRIgine (LAMICTAL) 100 MG tablet TAKE 1 TAB 2 TIMES DAILY. FURTHER REFILLS NEW PSYCH FOR ALL PSYCH MEDS ONLY TEMP SUPPLY FROM PCP   leflunomide (ARAVA) 20 MG tablet Take 1 tablet (20 mg total) by mouth daily.   lidocaine (LIDODERM) 5 % Place 1 patch onto the skin 2 (two) times daily as needed. Remove & Discard patch within 12 hours or as directed by MD   lovastatin (MEVACOR) 20 MG tablet Take 1 tablet (20 mg total) by mouth daily at 6 PM.   methocarbamol (ROBAXIN) 500 MG tablet Take 500 mg by mouth 4 (four) times daily as needed for muscle spasms. Taking twice a day currently   mometasone (NASONEX) 50 MCG/ACT nasal spray 1 spray to each nostril twice a day   montelukast (SINGULAIR) 10 MG tablet Take 1 tablet (10 mg total) by mouth daily.   multivitamin-lutein (OCUVITE-LUTEIN) CAPS capsule Take 1 capsule by mouth at bedtime.   nebivolol (BYSTOLIC) 5 MG tablet Take 1 tablet (5 mg total) by mouth in the morning and at bedtime. (Note dose changed from 1/2 10 mg bid to 5 mg bid)   ondansetron (ZOFRAN) 4 MG tablet Take 1 tablet (4 mg total) by mouth every 6 (six) hours as needed for nausea.   OXYGEN Inhale 3 L into the lungs continuous.   pantoprazole (PROTONIX) 40 MG tablet TAKE 1 TABLET BY MOUTH 2 TIMES DAILY 30 MIN BEFORE FOOD (NOTE REDUCTION IN FREQUENCY)   predniSONE (DELTASONE) 5 MG tablet TAKE 1 TABLET BY MOUTH EVERY DAY WITH BREAKFAST   PROLIA 60 MG/ML SOSY injection Inject 60 mg into the skin every 6 (six) months.   QUEtiapine (SEROQUEL) 25 MG tablet TAKE 1 TABLET (25 MG TOTAL) BY MOUTH AT BEDTIME. AGAIN LAST FILL FURTHER REFILLS FROM PSYCHIATRY NO EXCEPTIONS   sucralfate (CARAFATE) 1 g tablet TAKE 1 TABLET BY MOUTH 2 TIMES DAILY.   traMADol (ULTRAM) 50 MG tablet Take 1 tablet (50 mg total) by mouth every 12 (twelve) hours as needed.   vancomycin (VANCOCIN) 125 MG capsule Take 1 capsule (125 mg total) by mouth 4 (four) times daily for 9 days, THEN 1 capsule (125 mg  total) 2 (two) times daily for 7 days, THEN 1 capsule (125 mg total) daily for 7 days, THEN 1 capsule (125 mg total) every other day for 7 days, THEN 1 capsule (125 mg total) every 3 (three) days for 7 days.   Vibegron (GEMTESA) 75 MG TABS Take 75 mg by mouth daily.    Immunization  History  Administered Date(s) Administered   Fluad Quad(high Dose 65+) 10/03/2018, 10/16/2019, 11/22/2020, 10/21/2021   Influenza Split 10/04/2012, 10/25/2013, 10/28/2016   Influenza Whole 11/03/2011   Influenza, High Dose Seasonal PF 10/04/2015, 11/03/2016   Influenza,inj,Quad PF,6+ Mos 10/31/2014   Influenza-Unspecified 01/02/2010, 11/24/2011, 10/21/2013, 10/31/2014, 09/28/2017   PPD Test 07/17/2013, 11/05/2020, 04/09/2022   Pneumococcal Conjugate-13 10/21/2013   Pneumococcal Polysaccharide-23 11/07/2015   Respiratory Syncytial Virus Vaccine,Recomb Aduvanted(Arexvy) 01/12/2022      Objective:    BP 120/78 (BP Location: Left Arm, Cuff Size: Normal)   Pulse 66   Temp 98.3 F (36.8 C)   Ht 4' 11.5" (1.511 m)   Wt 173 lb (78.5 kg)   SpO2 96%   BMI 34.36 kg/m   SpO2: 96 % O2 Device: Nasal cannula O2 Flow Rate (L/min): 3 L/min O2 Type: Pulse O2  GENERAL: Well-developed, well-nourished elderly woman, she presents ambulatory with assistance of a walker.   HEAD: Normocephalic, atraumatic. EYES: Pupils equal, round, reactive to light.  No scleral icterus. MOUTH: Oral mucosa moist.  No thrush. NECK: Supple. No thyromegaly. Trachea midline. No JVD.  No adenopathy. PULMONARY: Good air entry bilaterally.  Coarse breath sounds throughout, otherwise no adventitious sounds.  CARDIOVASCULAR: S1 and S2. Regular rate and rhythm.  Grade 3/6 systolic ejection murmur left sternal border, seems more prominent than on prior exam. GASTROINTESTINAL: Benign MUSCULOSKELETAL: Moderate changes of RA both hands, no clubbing, +2 edema LE's.  Limited range of motion of joints due to RA. NEUROLOGIC: No overt focal deficits.   Awake, alert, speech is fluent. SKIN: Intact,warm,dry. PSYCH: Mood and behavior appropriate   Assessment & Plan:     ICD-10-CM   1. Asthma-COPD overlap syndrome  J44.89    Continue Trelegy Ellipta 100 Continue as needed albuterol    2. Chronic respiratory failure with hypoxia (HCC)  J96.11    Continue oxygen at 3 L/min Patient compliant with oxygen and notes benefit of therapy    3. Mitral and aortic valve regurgitation  I08.0    This issue adds complexity to her management Continue follow-up with cardiology    4. NSIP (nonspecific interstitial pneumonitis) (HCC)  J84.89    This issue adds complexity to her management Related to her rheumatoid arthritis    5. Rheumatoid arthritis involving multiple sites with positive rheumatoid factor (HCC)  M05.79    This issue adds complexity to her management Follows with rheumatology [facility]      Overall Kerri Carter is doing well from the pulmonary standpoint.  She is to continue oxygen as is.  Continue Trelegy and as needed albuterol.   Gailen Shelter, MD Advanced Bronchoscopy PCCM [facility]    *This note was dictated using voice recognition software/Dragon.  Despite best efforts to proofread, errors can occur which can change the meaning. Any transcriptional errors that result from this process are unintentional and may not be fully corrected at the time of dictation.

## 2022-09-18 ENCOUNTER — Ambulatory Visit: Payer: Medicare PPO | Admitting: Podiatry

## 2022-09-21 ENCOUNTER — Ambulatory Visit (INDEPENDENT_AMBULATORY_CARE_PROVIDER_SITE_OTHER): Payer: Medicare PPO | Admitting: Podiatry

## 2022-09-21 ENCOUNTER — Encounter: Payer: Self-pay | Admitting: Podiatry

## 2022-09-21 DIAGNOSIS — M79674 Pain in right toe(s): Secondary | ICD-10-CM

## 2022-09-21 DIAGNOSIS — M79675 Pain in left toe(s): Secondary | ICD-10-CM

## 2022-09-21 DIAGNOSIS — B351 Tinea unguium: Secondary | ICD-10-CM

## 2022-09-21 NOTE — Progress Notes (Signed)
  Subjective:  Patient ID: Kerri Carter, female    DOB: 1940-06-18,  MRN: [id number]  Chief Complaint  Patient presents with   Nail Problem    "This left big toenail looks like it's pulled a loose.  It's been bleeding.  My right one is ingrown." N - toenails  L - hallux bilateral D - 2 weeks O - suddenly, gotten worse C - shooting pains - right, loose, thick, discolored A - pressure, closed shoes T - wear open toed shoes, soaked in [employer], pedicures.    82 y.o. female presents with the above complaint. History confirmed with patient.   Objective:  Physical Exam: warm, good capillary refill, no trophic changes or ulcerative lesions, normal DP and PT pulses, normal sensory exam, and severely dystrophic mycotic nails x 10, left hallux nail has somewhat loosened, well attached proximally with distal onycholysis, no signs of infection  Assessment:   1. Pain due to onychomycosis of toenails of both feet      Plan:  Patient was evaluated and treated and all questions answered.  Discussed the etiology and treatment options for the condition in detail with the patient.  Doubtful to the treatment would be effective, she is not a good candidate for oral therapy with her comorbidities and use of Eliquis.  Recommended debridement of the nails today. Sharp and mechanical debridement performed of all painful and mycotic nails today. Nails debrided in length and thickness using a nail nipper to level of comfort. Discussed treatment options including appropriate shoe gear. Follow up as needed for painful nails.    Return in about 3 months (around 12/22/2022) for painful thick fungal nails.

## 2022-09-22 ENCOUNTER — Ambulatory Visit: Payer: Medicare PPO | Admitting: *Deleted

## 2022-09-22 ENCOUNTER — Telehealth: Payer: Self-pay | Admitting: *Deleted

## 2022-09-22 VITALS — Ht 59.0 in | Wt 173.0 lb

## 2022-09-22 DIAGNOSIS — Z0000 Encounter for general adult medical examination without abnormal findings: Secondary | ICD-10-CM | POA: Diagnosis not present

## 2022-09-22 NOTE — Progress Notes (Signed)
Subjective:   Kerri Carter is a 82 y.o. female who presents for Medicare Annual (Subsequent) preventive examination.  Visit Complete: Virtual  I connected with  Kerri Carter on 09/22/22 by a audio enabled telemedicine application and verified that I am speaking with the correct person using two identifiers.  Patient Location: Home  Provider Location: Office/Clinic  I discussed the limitations of evaluation and management by telemedicine. The patient expressed understanding and agreed to proceed.  Vital Signs: Unable to obtain new vitals due to this being a telehealth visit.   Review of Systems      Cardiac Risk Factors include: advanced age (>79men, >16 women);sedentary lifestyle;obesity (BMI >30kg/m2);dyslipidemia;hypertension     Objective:    Today's Vitals   09/22/22 1447 09/22/22 1448  Weight: 173 lb (78.5 kg)   Height: 4\' 11"  (1.499 m)   PainSc:  5    Body mass index is 34.94 kg/m.     09/22/2022    3:11 PM 09/14/2022    1:20 PM 08/10/2022    5:23 PM 07/28/2022    1:42 PM 07/16/2022    2:22 PM 03/28/2022    5:20 AM 03/28/2022    1:04 AM  Advanced Directives  Does Patient Have a Medical Advance Directive? Yes Yes No;Yes Yes No Yes Yes  Type of Estate agent of [place];Living will  Healthcare Power of [employer] of [place];Living will  Living will;Healthcare Power of Attorney   Does patient want to make changes to medical advance directive? No - Patient declined No - Patient declined    No - Patient declined   Copy of Healthcare Power of Attorney in Chart? Yes - validated most recent copy scanned in chart (See row information)     Yes - validated most recent copy scanned in chart (See row information)   Would patient like information on creating a medical advance directive?   No - Patient declined  No - Patient declined      Current Medications (verified) Outpatient Encounter Medications as of 09/22/2022  Medication Sig    albuterol (VENTOLIN HFA) 108 (90 Base) MCG/ACT inhaler Inhale 2 puffs into the lungs every 6 (six) hours as needed for wheezing or shortness of breath.   amLODipine (NORVASC) 5 MG tablet Take 1 tablet (5 mg total) by mouth daily. (Patient taking differently: Take 5 mg by mouth in the morning and at bedtime.)   apixaban (ELIQUIS) 5 MG TABS tablet Take 1 tablet (5 mg total) by mouth 2 (two) times daily.   busPIRone (BUSPAR) 5 MG tablet Take 1 tablet (5 mg total) by mouth 2 (two) times daily.   cetirizine (ZYRTEC) 10 MG tablet TAKE 1 TABLET BY MOUTH EVERYDAY AT BEDTIME   ENBREL SURECLICK 50 MG/ML injection Inject 50 mg into the skin once a week. Takes on Fridays   escitalopram (LEXAPRO) 10 MG tablet Take 1 tablet (10 mg total) by mouth daily.   Fluticasone-Umeclidin-Vilant (TRELEGY ELLIPTA) 100-62.5-25 MCG/ACT AEPB Inhale 1 puff into the lungs daily.   gabapentin (NEURONTIN) 300 MG capsule Take 300 mg in the morning and 600 mg at night. (Patient taking differently: Take 600 mg by mouth 2 (two) times daily. Take 300 mg in the morning and 600 mg at night.)   ipratropium-albuterol (DUONEB) 0.5-2.5 (3) MG/3ML SOLN    lamoTRIgine (LAMICTAL) 100 MG tablet TAKE 1 TAB 2 TIMES DAILY. FURTHER REFILLS NEW PSYCH FOR ALL PSYCH MEDS ONLY TEMP SUPPLY FROM PCP   leflunomide (ARAVA) 20 MG  tablet Take 1 tablet (20 mg total) by mouth daily.   lidocaine (LIDODERM) 5 % Place 1 patch onto the skin 2 (two) times daily as needed. Remove & Discard patch within 12 hours or as directed by MD   lovastatin (MEVACOR) 20 MG tablet Take 1 tablet (20 mg total) by mouth daily at 6 PM.   methocarbamol (ROBAXIN) 500 MG tablet Take 500 mg by mouth 4 (four) times daily as needed for muscle spasms. Taking twice a day currently   mometasone (NASONEX) 50 MCG/ACT nasal spray 1 spray to each nostril twice a day   montelukast (SINGULAIR) 10 MG tablet Take 1 tablet (10 mg total) by mouth daily.   multivitamin-lutein (OCUVITE-LUTEIN) CAPS capsule  Take 1 capsule by mouth at bedtime.   nebivolol (BYSTOLIC) 5 MG tablet Take 1 tablet (5 mg total) by mouth in the morning and at bedtime. (Note dose changed from 1/2 10 mg bid to 5 mg bid)   ondansetron (ZOFRAN) 4 MG tablet Take 1 tablet (4 mg total) by mouth every 6 (six) hours as needed for nausea.   OXYGEN Inhale 3 L into the lungs continuous.   pantoprazole (PROTONIX) 40 MG tablet TAKE 1 TABLET BY MOUTH 2 TIMES DAILY 30 MIN BEFORE FOOD (NOTE REDUCTION IN FREQUENCY)   predniSONE (DELTASONE) 5 MG tablet TAKE 1 TABLET BY MOUTH EVERY DAY WITH BREAKFAST   PROLIA 60 MG/ML SOSY injection Inject 60 mg into the skin every 6 (six) months.   QUEtiapine (SEROQUEL) 25 MG tablet TAKE 1 TABLET (25 MG TOTAL) BY MOUTH AT BEDTIME. AGAIN LAST FILL FURTHER REFILLS FROM PSYCHIATRY NO EXCEPTIONS   sucralfate (CARAFATE) 1 g tablet TAKE 1 TABLET BY MOUTH 2 TIMES DAILY.   traMADol (ULTRAM) 50 MG tablet Take 1 tablet (50 mg total) by mouth every 12 (twelve) hours as needed.   Vibegron (GEMTESA) 75 MG TABS Take 75 mg by mouth daily.   dicyclomine (BENTYL) 10 MG capsule Take 1 capsule (10 mg total) by mouth 3 (three) times daily before meals. (Patient taking differently: Take 10 mg by mouth 2 (two) times daily as needed.)   torsemide (DEMADEX) 20 MG tablet Take 2 tablets (40 mg total) by mouth daily.   No facility-administered encounter medications on file as of 09/22/2022.    Allergies (verified) Ceftin [cefuroxime axetil]; Lisinopril; Sulfa antibiotics; Sulfasalazine; Morphine; Xarelto [rivaroxaban]; Adhesive [tape]; Antihistamines, chlorpheniramine-type; Antivert [meclizine hcl]; Aspirin; Contrast media [iodinated contrast media]; Decongestant [pseudoephedrine hcl]; Doxycycline; Levaquin [levofloxacin in d5w]; Polymyxin b; and Tetanus toxoids   History: Past Medical History:  Diagnosis Date   Acromial process of scapula fracture 12/21/2019   Acute kidney injury superimposed on chronic kidney disease (HCC)  05/26/2021   Acute on chronic combined systolic (congestive) and diastolic (congestive) heart failure (HCC) 07/28/2021   Acute on chronic combined systolic and diastolic CHF (congestive heart failure) (HCC) 05/26/2021   Acute on chronic respiratory failure with hypoxia (HCC) 09/10/2021   Acute pain of left knee 12/18/2020   Anemia in stage 3a chronic kidney disease (HCC) 10/09/2021   Anxiety    Anxiety 01/20/2019   Anxiety and depression 05/26/2021   Aortic stenosis 07/09/2015   a.) TTE 07/06/2015: EF 55-60%; mild AS with MPG 13.0 mmHg.   Arrhythmia    atrial fibrillation   Assistance needed for mobility 12/18/2020   Asthma    At high risk for falls 03/16/2019   Avascular necrosis of left shoulder due to adverse effect of steroid therapy (HCC) 01/20/2018   Bipolar disorder (  HCC)    C. difficile diarrhea 2010   a.) following ABX course during hospital admission   Carotid atherosclerosis, bilateral    a.) Moderate; < 50% stenosis BILATERAL ICAs.   Cataract    a.) s/p BILATERAL extraction   CHF (congestive heart failure) (HCC)    Chicken pox    Chronic left shoulder pain 08/29/2019   Chronic respiratory failure with hypoxia (HCC) 10/01/2021   CKD (chronic kidney disease), stage III (HCC)    a. s/p R nephrectomy./ aneurysm   Closed fracture of acromial process of scapula 10/28/2018   Closed nondisplaced fracture of proximal phalanx of lesser toe of right foot    Collapsed vertebra, not elsewhere classified, lumbar region, initial encounter for fracture (HCC) 02/21/2019   Community acquired pneumonia 07/22/2021   Complicated grief 03/16/2019   Compression fracture of L4 vertebra (HCC) 02/21/2019   Conversion disorder    Conversion disorder 08/04/2015   COPD (chronic obstructive pulmonary disease) (HCC)    COPD with exacerbation (HCC) 08/20/2021   Cough 05/02/2012   Depression    Dislocation of hip joint prosthesis (HCC) 01/08/2017   Elevated brain natriuretic peptide (BNP) level  12/18/2020   Elevated d-dimer 05/26/2021   Episodic mood disorder (HCC) 04/18/2019   Essential hypertension    Fatigue 07/07/2018   Frequent falls 12/18/2020   GERD (gastroesophageal reflux disease)    H/O cardiac catheterization 02/24/2011   Formatting of this note might be different from the original.  Repeated at [facility] 2/13 and insignificant disease   Hav (hallux abducto valgus), unspecified laterality 12/05/2018   Heart murmur    Hip fracture (HCC) 10/31/2016   Hip pain 12/18/2020   History of fracture of left hip 01/15/2017   History of syncope 08/25/2016   Hyperkalemia 05/26/2021   - The patient was given p.o. Lokelma and IV Lasix.  - We will follow potassium level.   Hyperlipidemia    Hypokalemia 08/25/2016   ILD (interstitial lung disease) (HCC)    mild; 2/2 RA diagnosis   Iliopsoas bursitis of right hip 08/29/2019   Inflammatory arthritis    a. hands/carpal tunnel.  b. Low titer rheumatoid factor. c. Negative anti-CCP antibodies. d. Plaquenil.   Insomnia 07/17/2015   Intractable nausea and vomiting 08/10/2022   Left foot pain 10/19/2016   Left lower lobe pneumonia 05/26/2021   Leg edema 05/02/2019   Leg pain, bilateral    Leg swelling 12/15/2016   Leukocytosis 10/19/2017   Leukopenia 06/24/2021   Lymphocytosis 07/13/2021   Macular degeneration    Microcytic anemia 08/15/2015   Mixed bipolar I disorder (HCC) 10/09/2015   Nocturnal hypoxemia    Nocturnal hypoxemia 09/09/2011   June 2013 overnight oximetry on 6 centimeters of water CPAP: SpO2 less than 88% 72% of the time   Non-Obstructive CAD    a. 07/2009 Cath ([facility]): nonobs dzs;  b. 03/2011 Cath [facility]): nonobs dzs.   NSIP (nonspecific interstitial pneumonitis) (HCC) 12/08/2019   Osteoarthritis    a. Knees.   Other shoulder lesions, left shoulder 12/20/2017   Other specified postprocedural states 07/20/2013   Overweight (BMI 25.0-29.9) 08/29/2019   PAD (peripheral artery disease) (HCC)    Pain due to  onychomycosis of toenails of both feet 12/05/2018   Pelvic mass in female 03/16/2019   02/21/19 CT pelvis w/o contrast  Cystic left adnexal lesion has enlarged since 2018 where it  measured approximately 1.8 x 1.7 cm. This is not well assessed due  to streak artifact as well as lack  of contrast. Ultrasound follow-up  may be helpful in 8-12 weeks      04/05/19 TVUS     IMPRESSION:  Simple appearing cystic structure within the left ovary measuring up  to 2.9 cm. While this has enlarged s   Physical deconditioning 02/27/2021   Pleural effusion on left 12/18/2020   PUD (peptic ulcer disease)    Recurrent falls 09/01/2021   Recurrent pneumonia 07/23/2021   Repeated falls 12/18/2020   Respiratory failure with hypoxia (HCC) 01/12/2021   Rotator cuff tendinitis, right 07/26/2017   S/P lumbar fusion 05/23/2021   S/P right hip fracture    11/01/16 s/p repair   Sepsis due to pneumonia (HCC) 07/22/2021   Shoulder pain    Sleep apnea    no cpap / minimal   SOB (shortness of breath) 10/13/2011   2011 [facility] Cardiology and Pulmonary eval >> LHC (non-obstructive CAD), RHC (wedge 13, mean PA 26), TTE (LVEF > 55%, mild concentric LVH, mild AR, MR, PR, TR), CTAngio chest unrevealing; "well controlled asthma"  02/2011 TTE [facility]>> NL LVEF, Mild LVH, Mod AR/Mod MR   Spinal stenosis at L4-L5 level    severe with L4/L5 anterolisthesis grade 1 anterolisthesis    Spinal stenosis of lumbar region 09/15/2016   Last Assessment & Plan:   Now post op with continued back and leg pain noted. Is working with pt/ot and surgery was said to have no complications. Bowels are moving and general pain and anxiety is basically baseline.    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   Weakness of both lower extremities 12/18/2020   Past Surgical History:  Procedure Laterality Date   APPENDECTOMY     APPLICATION OF INTRAOPERATIVE CT SCAN N/A 05/23/2021   Procedure: APPLICATION OF INTRAOPERATIVE CT SCAN;  Surgeon: Venetia Night, MD;  Location: [facility] ORS;  Service: Neurosurgery;  Laterality: N/A;   BACK SURGERY     lumbar fusion   BUNIONECTOMY Right    CATARACT EXTRACTION, BILATERAL     CESAREAN SECTION     x1   CHOLECYSTECTOMY N/A 05/11/2016   Procedure: LAPAROSCOPIC CHOLECYSTECTOMY;  Surgeon: Lattie Haw, MD;  Location: [facility];  Service: General;  Laterality: N/A;   COLONOSCOPY WITH PROPOFOL N/A 04/02/2016   Procedure: COLONOSCOPY WITH PROPOFOL;  Surgeon: Wyline Mood, MD;  Location: [facility];  Service: Endoscopy;  Laterality: N/A;   ENDOSCOPIC RETROGRADE CHOLANGIOPANCREATOGRAPHY (ERCP) WITH PROPOFOL N/A 05/08/2016   Procedure: ENDOSCOPIC RETROGRADE CHOLANGIOPANCREATOGRAPHY (ERCP) WITH PROPOFOL;  Surgeon: Midge Minium, MD;  Location: [facility];  Service: Endoscopy;  Laterality: N/A;   ERCP     with biliary spincterotomy 05/08/16 Dr. Servando Snare for choledocholithiasis    ESOPHAGEAL DILATION  04/02/2016   Procedure: ESOPHAGEAL DILATION;  Surgeon: Wyline Mood, MD;  Location: [facility];  Service: Endoscopy;;   ESOPHAGOGASTRODUODENOSCOPY (EGD) WITH PROPOFOL N/A 04/02/2016   Procedure: ESOPHAGOGASTRODUODENOSCOPY (EGD) WITH PROPOFOL;  Surgeon: Wyline Mood, MD;  Location: [facility];  Service: Endoscopy;  Laterality: N/A;   HIP  ARTHROPLASTY Right 11/01/2016   Procedure: ARTHROPLASTY BIPOLAR HIP (HEMIARTHROPLASTY);  Surgeon: Christena Flake, MD;  Location: [facility];  Service: Orthopedics;  Laterality: Right;    LUMBAR LAMINECTOMY/DECOMPRESSION MICRODISCECTOMY N/A 12/11/2020   Procedure: LEFT L2-3 MICRODISCECTOMY, L3-4 AND L5-S1 DECOMPRESSION;  Surgeon: Venetia Night, MD;  Location: [facility];  Service: Neurosurgery;  Laterality: N/A;   NEPHRECTOMY  1988   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   ORIF SCAPHOID FRACTURE Left 12/21/2019   Procedure: OPEN REDUCTION INTERNAL FIXATION (ORIF) OF LEFT SCAPULAR NONUNION WITH BONE GRAFT;  Surgeon: Christena Flake, MD;  Location: [facility];  Service: Orthopedics;  Laterality: Left;   osteoporosis     noted DEXA 08/19/16    REPLACEMENT TOTAL KNEE Right    REVERSE SHOULDER ARTHROPLASTY Right 11/04/2017   Procedure: REVERSE SHOULDER ARTHROPLASTY;  Surgeon: Christena Flake, MD;  Location: [facility];  Service: Orthopedics;  Laterality: Right;   REVERSE SHOULDER ARTHROPLASTY Left 07/26/2018   Procedure: REVERSE SHOULDER ARTHROPLASTY;  Surgeon: Christena Flake, MD;  Location: [facility];  Service: Orthopedics;  Laterality: Left;   TONSILLECTOMY     TOTAL HIP ARTHROPLASTY  12/10/11   [facility] left hip   TOTAL HIP ARTHROPLASTY Bilateral    TRANSFORAMINAL LUMBAR INTERBODY FUSION (TLIF) WITH PEDICLE SCREW FIXATION 3 LEVEL N/A 05/23/2021   Procedure: OPEN L2-4 TRANSFORAMINAL LUMBAR INTERBODY FUSION (TLIF) WITH L2-5 POSTERIOR SPINAL FUSION;  Surgeon: Venetia Night, MD;  Location: [facility];  Service: Neurosurgery;  Laterality: N/A;   TUBAL LIGATION     Family History  Problem Relation Age of Onset   Rheum arthritis Mother    Asthma Mother    Parkinson's disease Mother    Heart disease Mother    Stroke Mother    Hypertension Mother    Heart attack Father    Heart disease Father    Hypertension Father    Peripheral Artery Disease Father    Diabetes Son    Gout Son    Asthma Sister    Heart disease Sister    Lung cancer Sister    Heart disease Sister    Heart disease Sister    Breast cancer Sister    Heart attack Sister    Heart disease  Brother    Heart disease Maternal Grandmother    Diabetes Maternal Grandmother    Colon cancer Maternal Grandmother    Cancer Maternal Grandmother        Hodgkins lymphoma   Heart disease Brother    Alcohol abuse Brother    Depression Brother    Dementia Son    Social History   Socioeconomic History   Marital status: Widowed    Spouse name: richard   Number of children: 2   Years of education: Some Coll   Highest education level: Some college, no degree  Occupational History   Occupation: Retired    Comment: retired  Tobacco Use   Smoking status: Former    Current packs/day: 0.00    Average packs/day: 0.5 packs/day for 20.0 years (10.0 ttl pk-yrs)    Types: Cigarettes    Start date: 02/02/1954    Quit date: 02/02/1974    Years since quitting: 48.6   Smokeless tobacco: Never  Vaping Use   Vaping status: Never Used  Substance and Sexual Activity   Alcohol use: No   Drug use: No   Sexual activity: Not Currently  Other Topics Concern   Not on file  Social History Narrative  Lives at home in [place] grandson, his wife and their child.   Husband Janiyah Carter died covid 55 late 1/2054married x 51 years   Right-handed.   6 cups coffee per day.   Social Determinants of Health   Financial Resource Strain: Low Risk  (09/22/2022)   Overall Financial Resource Strain (CARDIA)    Difficulty of Paying Living Expenses: Not hard at all  Food Insecurity: No Food Insecurity (09/22/2022)   Hunger Vital Sign    Worried About Running Out of Food in the Last Year: Never true    Ran Out of Food in the Last Year: Never true  Transportation Needs: No Transportation Needs (09/22/2022)   PRAPARE - Administrator, Civil Service (Medical): No    Lack of Transportation (Non-Medical): No  Physical Activity: Inactive (09/22/2022)   Exercise Vital Sign    Days of Exercise per Week: 0 days    Minutes of Exercise per Session: 0 min  Stress: No Stress Concern Present (09/22/2022)    [facility]ri [employer] Occupational Health - Occupational Stress Questionnaire    Feeling of Stress : Only a little  Social Connections: Socially Isolated (09/22/2022)   Social Connection and Isolation Panel [NHANES]    Frequency of Communication with Friends and Family: More than three times a week    Frequency of Social Gatherings with Friends and Family: More than three times a week    Attends Religious Services: Never    Database administrator or Organizations: No    Attends Banker Meetings: Never    Marital Status: Widowed    Tobacco Counseling Counseling given: Not Answered   Clinical Intake:  Pre-visit preparation completed: Yes  Pain : 0-10 Pain Score: 5  Pain Type: Acute pain (started Saturday) Pain Location: Back Pain Orientation: Lower Pain Descriptors / Indicators: Aching Pain Onset: In the past 7 days (started Saturday) Pain Frequency: Constant     BMI - recorded: 34.94 Nutritional Status: BMI > 30  Obese Nutritional Risks: None Diabetes: No  How often do you need to have someone help you when you read instructions, pamphlets, or other written materials from your doctor or pharmacy?: 1 - Never  Interpreter Needed?: No  Information entered by :: R. Aniesha Haughn LPN   Activities of Daily Living    09/22/2022    2:55 PM 07/17/2022    3:02 PM  In your present state of health, do you have any difficulty performing the following activities:  Hearing? 1 0  Comment at times   Vision? 0 0  Difficulty concentrating or making decisions? 1 0  Comment memory   Walking or climbing stairs? 1 1  Comment uses a walker   Dressing or bathing? 0 0  Doing errands, shopping? 0 0  Preparing Food and eating ? Y   Using the Toilet? N   In the past six months, have you accidently leaked urine? Y   Comment wears depends   Do you have problems with loss of bowel control? Y   Managing your Medications? Y   Managing your Finances? N   Housekeeping or managing your  Housekeeping? Y     Patient Care Team: Dana Allan, MD as PCP - General (Family Medicine) Otho Ket, RN as [facility] Care Management Salena Saner, MD as Consulting Physician (Pulmonary Disease)  Indicate any recent Medical Services you may have received from other than [facility] providers in the past year (date may be approximate).  Assessment:   This is a routine wellness examination for Kerri Carter.  Hearing/Vision screen Hearing Screening - Comments:: Some difficulty Vision Screening - Comments:: No issues  Dietary issues and exercise activities discussed:     Goals Addressed             This Visit's Progress    Patient Stated       Try to eat better and lose some weight       Depression Screen    09/22/2022    3:05 PM 07/29/2022    4:06 PM 05/13/2022   11:40 AM 01/06/2022    2:18 PM 09/24/2021    2:06 PM 09/02/2021   11:52 AM 08/29/2021    2:31 PM  PHQ 2/9 Scores  PHQ - 2 Score 1 1 0 0 0 0   PHQ- 9 Score  10       Exception Documentation       Patient refusal    Fall Risk    09/22/2022    2:59 PM 09/14/2022    1:20 PM 07/29/2022    4:05 PM 07/28/2022    1:42 PM 05/13/2022   11:39 AM  Fall Risk   Falls in the past year? 1 1 1 1 1   Number falls in past yr: 1 1 0 0 0  Injury with Fall? 1 1 0 0 0  Comment left shoulder left shoulder     Risk for fall due to : History of fall(s);Impaired balance/gait  History of fall(s)  No Fall Risks  Follow up Falls evaluation completed;Falls prevention discussed  Falls evaluation completed  Falls evaluation completed    MEDICARE RISK AT HOME: Medicare Risk at Home Any stairs in or around the home?: No If so, are there any without handrails?: No Home free of loose throw rugs in walkways, pet beds, electrical cords, etc?: Yes Adequate lighting in your home to reduce risk of falls?: Yes Life alert?: Yes Use of a cane, walker or w/c?: Yes (walker) Grab bars in the bathroom?: Yes Shower chair or bench in  shower?: Yes Elevated toilet seat or a handicapped toilet?: Yes     Cognitive Function:    03/01/2017    3:43 PM 02/28/2016    2:22 PM 07/11/2015   10:18 AM  MMSE - Mini Mental State Exam  Orientation to time 5 5 4   Orientation to Place 5 5 5   Registration 3 3 3   Attention/ Calculation 5 5 5   Recall 3 3 1   Language- name 2 objects 2 2 2   Language- repeat 1 1 1   Language- follow 3 step command 3 3 3   Language- read & follow direction 1 1 1   Write a sentence 1 1 1   Copy design 1 1 1   Total score 30 30 27         09/22/2022    3:12 PM  6CIT Screen  What Year? 0 points  What month? 0 points  What time? 0 points  Count back from 20 0 points  Months in reverse 0 points  Repeat phrase 2 points  Total Score 2 points    Immunizations Immunization History  Administered Date(s) Administered   Fluad Quad(high Dose 65+) 10/03/2018, 10/16/2019, 11/22/2020, 10/21/2021   Influenza Split 10/04/2012, 10/25/2013, 10/28/2016   Influenza Whole 11/03/2011   Influenza, High Dose Seasonal PF 10/04/2015, 11/03/2016   Influenza,inj,Quad PF,6+ Mos 10/31/2014   Influenza-Unspecified 01/02/2010, 11/24/2011, 10/21/2013, 10/31/2014, 09/28/2017   PPD Test 07/17/2013, 11/05/2020, 04/09/2022   Pneumococcal Conjugate-13 10/21/2013  Pneumococcal Polysaccharide-23 11/07/2015   Respiratory Syncytial Virus Vaccine,Recomb Aduvanted(Arexvy) 01/12/2022    TDAP status: Due, Education has been provided regarding the importance of this vaccine. Advised may receive this vaccine at local pharmacy or Health Dept. Aware to provide a copy of the vaccination record if obtained from local pharmacy or Health Dept. Verbalized acceptance and understanding. Unable to take because allergic reaction  Flu Vaccine status: Up to date  Pneumococcal vaccine status: Up to date  Covid-19 vaccine status: Declined, Education has been provided regarding the importance of this vaccine but patient still declined. Advised may  receive this vaccine at local pharmacy or Health Dept.or vaccine clinic. Aware to provide a copy of the vaccination record if obtained from local pharmacy or Health Dept. Verbalized acceptance and understanding.  Qualifies for Shingles Vaccine? Yes   Zostavax completed No   Shingrix Completed?: No.    Education has been provided regarding the importance of this vaccine. Patient has been advised to call insurance company to determine out of pocket expense if they have not yet received this vaccine. Advised may also receive vaccine at local pharmacy or Health Dept. Verbalized acceptance and understanding.  Screening Tests Health Maintenance  Topic Date Due   Medicare Annual Wellness (AWV)  06/19/2022   INFLUENZA VACCINE  09/03/2022   Pneumonia Vaccine 80+ Years old  Completed   DEXA SCAN  Completed   HPV VACCINES  Aged Out   DTaP/Tdap/Td  Discontinued   Colonoscopy  Discontinued   COVID-19 Vaccine  Discontinued   Zoster Vaccines- Shingrix  Discontinued    Health Maintenance  Health Maintenance Due  Topic Date Due   Medicare Annual Wellness (AWV)  06/19/2022   INFLUENZA VACCINE  09/03/2022    Colorectal cancer screening: No longer required.   Mammogram status: No longer required due to age.  Bone Density status: Completed 7/18. Results reflect: Bone density results: OSTEOPOROSIS. Repeat every 2 years. Per patient unable to do because of all the metal in her body  Lung Cancer Screening: (Low Dose CT Chest recommended if Age 44-80 years, 20 pack-year currently smoking OR have quit w/in 15years.) does not qualify.    Additional Screening:  Hepatitis C Screening: does not qualify; Completed NA age  Vision Screening: Recommended annual ophthalmology exams for early detection of glaucoma and other disorders of the eye. Is the patient up to date with their annual eye exam?  Yes  Who is the provider or what is the name of the office in which the patient attends annual eye exams? [facility] If pt is not established with a provider, would they like to be referred to a provider to establish care? No .   Dental Screening: Recommended annual dental exams for proper oral hygiene   Community Resource Referral / Chronic Care Management: CRR required this visit?  No   CCM required this visit?  No     Plan:     I have personally reviewed and noted the following in the patient's chart:   Medical and social history Use of alcohol, tobacco or illicit drugs  Current medications and supplements including opioid prescriptions. Patient is currently taking opioid prescriptions. Information provided to patient regarding non-opioid alternatives. Patient advised to discuss non-opioid treatment plan with their provider. Functional ability and status Nutritional status Physical activity Advanced directives List of other physicians Hospitalizations, surgeries, and ER visits in previous 12 months Vitals Screenings to include cognitive, depression, and falls Referrals and appointments  In addition, I have reviewed  and discussed with patient certain preventive protocols, quality metrics, and best practice recommendations. A written personalized care plan for preventive services as well as general preventive health recommendations were provided to patient.     Sydell Axon, LPN   1/61/0960   After Visit Summary: (Declined) Due to this being a telephonic visit, with patients personalized plan was offered to patient but patient Declined AVS at this time   Nurse Notes: None

## 2022-09-22 NOTE — Patient Instructions (Signed)
Kerri Carter , Thank you for taking time to come for your Medicare Wellness Visit. I appreciate your ongoing commitment to your health goals. Please review the following plan we discussed and let me know if I can assist you in the future.   Referrals/Orders/Follow-Ups/Clinician Recommendations: None  This is a list of the screening recommended for you and due dates:  Health Maintenance  Topic Date Due   Flu Shot  09/03/2022   Medicare Annual Wellness Visit  09/22/2023   Pneumonia Vaccine  Completed   DEXA scan (bone density measurement)  Completed   HPV Vaccine  Aged Out   DTaP/Tdap/Td vaccine  Discontinued   Colon Cancer Screening  Discontinued   COVID-19 Vaccine  Discontinued   Zoster (Shingles) Vaccine  Discontinued    Advanced directives: (Declined) Advance directive discussed with you today. Even though you declined this today, please call our office should you change your mind, and we can give you the proper paperwork for you to fill out.  Next Medicare Annual Wellness Visit scheduled for next year: Yes 09/27/23 @ 2:30  Managing Pain Without Opioids Opioids are strong medicines used to treat moderate to severe pain. For some people, especially those who have long-term (chronic) pain, opioids may not be the best choice for pain management due to: Side effects like nausea, constipation, and sleepiness. The risk of addiction (opioid use disorder). The longer you take opioids, the greater your risk of addiction. Pain that lasts for more than 3 months is called chronic pain. Managing chronic pain usually requires more than one approach and is often provided by a team of health care providers working together (multidisciplinary approach). Pain management may be done at a pain management center or pain clinic. How to manage pain without the use of opioids Use non-opioid medicines Non-opioid medicines for pain may include: Over-the-counter or prescription non-steroidal anti-inflammatory  drugs (NSAIDs). These may be the first medicines used for pain. They work well for muscle and bone pain, and they reduce swelling. Acetaminophen. This over-the-counter medicine may work well for milder pain but not swelling. Antidepressants. These may be used to treat chronic pain. A certain type of antidepressant (tricyclics) is often used. These medicines are given in lower doses for pain than when used for depression. Anticonvulsants. These are usually used to treat seizures but may also reduce nerve (neuropathic) pain. Muscle relaxants. These relieve pain caused by sudden muscle tightening (spasms). You may also use a pain medicine that is applied to the skin as a patch, cream, or gel (topical analgesic), such as a numbing medicine. These may cause fewer side effects than medicines taken by mouth. Do certain therapies as directed Some therapies can help with pain management. They include: Physical therapy. You will do exercises to gain strength and flexibility. A physical therapist may teach you exercises to move and stretch parts of your body that are weak, stiff, or painful. You can learn these exercises at physical therapy visits and practice them at home. Physical therapy may also involve: Massage. Heat wraps or applying heat or cold to affected areas. Electrical signals that interrupt pain signals (transcutaneous electrical nerve stimulation, TENS). Weak lasers that reduce pain and swelling (low-level laser therapy). Signals from your body that help you learn to regulate pain (biofeedback). Occupational therapy. This helps you to learn ways to function at home and work with less pain. Recreational therapy. This involves trying new activities or hobbies, such as a physical activity or drawing. Mental health therapy, including: Cognitive behavioral  therapy (CBT). This helps you learn coping skills for dealing with pain. Acceptance and commitment therapy (ACT) to change the way you think and  react to pain. Relaxation therapies, including muscle relaxation exercises and mindfulness-based stress reduction. Pain management counseling. This may be individual, family, or group counseling.  Receive medical treatments Medical treatments for pain management include: Nerve block injections. These may include a pain blocker and anti-inflammatory medicines. You may have injections: Near the spine to relieve chronic back or neck pain. Into joints to relieve back or joint pain. Into nerve areas that supply a painful area to relieve body pain. Into muscles (trigger point injections) to relieve some painful muscle conditions. A medical device placed near your spine to help block pain signals and relieve nerve pain or chronic back pain (spinal cord stimulation device). Acupuncture. Follow these instructions at home Medicines Take over-the-counter and prescription medicines only as told by your health care provider. If you are taking pain medicine, ask your health care providers about possible side effects to watch out for. Do not drive or use heavy machinery while taking prescription opioid pain medicine. Lifestyle  Do not use drugs or alcohol to reduce pain. If you drink alcohol, limit how much you have to: 0-1 drink a day for women who are not pregnant. 0-2 drinks a day for men. Know how much alcohol is in a drink. In the [place] one drink equals one 12 oz bottle of beer (355 mL), one 5 oz glass of wine (148 mL), or one 1 oz glass of hard liquor (44 mL). Do not use any products that contain nicotine or tobacco. These products include cigarettes, chewing tobacco, and vaping devices, such as e-cigarettes. If you need help quitting, ask your health care provider. Eat a healthy diet and maintain a healthy weight. Poor diet and excess weight may make pain worse. Eat foods that are high in fiber. These include fresh fruits and vegetables, whole grains, and beans. Limit foods that are high in fat  and processed sugars, such as fried and sweet foods. Exercise regularly. Exercise lowers stress and may help relieve pain. Ask your health care provider what activities and exercises are safe for you. If your health care provider approves, join an exercise class that combines movement and stress reduction. Examples include yoga and tai chi. Get enough sleep. Lack of sleep may make pain worse. Lower stress as much as possible. Practice stress reduction techniques as told by your therapist. General instructions Work with all your pain management providers to find the treatments that work best for you. You are an important member of your pain management team. There are many things you can do to reduce pain on your own. Consider joining an online or in-person support group for people who have chronic pain. Keep all follow-up visits. This is important. Where to find more information You can find more information about managing pain without opioids from: American Academy of Pain Medicine: painmed.org Institute for Chronic Pain: instituteforchronicpain.org American Chronic Pain Association: theacpa.org Contact a health care provider if: You have side effects from pain medicine. Your pain gets worse or does not get better with treatments or home therapy. You are struggling with anxiety or depression. Summary Many types of pain can be managed without opioids. Chronic pain may respond better to pain management without opioids. Pain is best managed when you and a team of health care providers work together. Pain management without opioids may include non-opioid medicines, medical treatments, physical therapy, mental health  therapy, and lifestyle changes. Tell your health care providers if your pain gets worse or is not being managed well enough. This information is not intended to replace advice given to you by your health care provider. Make sure you discuss any questions you have with your health care  provider. Document Revised: 05/01/2020 Document Reviewed: 05/01/2020 Elsevier Patient Education  2024 [employer].   Preventive Care 65 Years and Older, Female Preventive care refers to lifestyle choices and visits with your health care provider that can promote health and wellness. What does preventive care include? A yearly physical exam. This is also called an annual well check. Dental exams once or twice a year. Routine eye exams. Ask your health care provider how often you should have your eyes checked. Personal lifestyle choices, including: Daily care of your teeth and gums. Regular physical activity. Eating a healthy diet. Avoiding tobacco and drug use. Limiting alcohol use. Practicing safe sex. Taking low-dose aspirin every day. Taking vitamin and mineral supplements as recommended by your health care provider. What happens during an annual well check? The services and screenings done by your health care provider during your annual well check will depend on your age, overall health, lifestyle risk factors, and family history of disease. Counseling  Your health care provider may ask you questions about your: Alcohol use. Tobacco use. Drug use. Emotional well-being. Home and relationship well-being. Sexual activity. Eating habits. History of falls. Memory and ability to understand (cognition). Work and work Astronomer. Reproductive health. Screening  You may have the following tests or measurements: Height, weight, and BMI. Blood pressure. Lipid and cholesterol levels. These may be checked every 5 years, or more frequently if you are over 110 years old. Skin check. Lung cancer screening. You may have this screening every year starting at age 85 if you have a 30-pack-year history of smoking and currently smoke or have quit within the past 15 years. Fecal occult blood test (FOBT) of the stool. You may have this test every year starting at age 51. Flexible  sigmoidoscopy or colonoscopy. You may have a sigmoidoscopy every 5 years or a colonoscopy every 10 years starting at age 10. Hepatitis C blood test. Hepatitis B blood test. Sexually transmitted disease (STD) testing. Diabetes screening. This is done by checking your blood sugar (glucose) after you have not eaten for a while (fasting). You may have this done every 1-3 years. Bone density scan. This is done to screen for osteoporosis. You may have this done starting at age 29. Mammogram. This may be done every 1-2 years. Talk to your health care provider about how often you should have regular mammograms. Talk with your health care provider about your test results, treatment options, and if necessary, the need for more tests. Vaccines  Your health care provider may recommend certain vaccines, such as: Influenza vaccine. This is recommended every year. Tetanus, diphtheria, and acellular pertussis (Tdap, Td) vaccine. You may need a Td booster every 10 years. Zoster vaccine. You may need this after age 59. Pneumococcal 13-valent conjugate (PCV13) vaccine. One dose is recommended after age 33. Pneumococcal polysaccharide (PPSV23) vaccine. One dose is recommended after age 7. Talk to your health care provider about which screenings and vaccines you need and how often you need them. This information is not intended to replace advice given to you by your health care provider. Make sure you discuss any questions you have with your health care provider. Document Released: 02/15/2015 Document Revised: 10/09/2015 Document Reviewed: 11/20/2014 Elsevier  Interactive Patient Education  2017 [employer].  Fall Prevention in the Home Falls can cause injuries. They can happen to people of all ages. There are many things you can do to make your home safe and to help prevent falls. What can I do on the outside of my home? Regularly fix the edges of walkways and driveways and fix any cracks. Remove anything that  might make you trip as you walk through a door, such as a raised step or threshold. Trim any bushes or trees on the path to your home. Use bright outdoor lighting. Clear any walking paths of anything that might make someone trip, such as rocks or tools. Regularly check to see if handrails are loose or broken. Make sure that both sides of any steps have handrails. Any raised decks and porches should have guardrails on the edges. Have any leaves, snow, or ice cleared regularly. Use sand or salt on walking paths during winter. Clean up any spills in your garage right away. This includes oil or grease spills. What can I do in the bathroom? Use night lights. Install grab bars by the toilet and in the tub and shower. Do not use towel bars as grab bars. Use non-skid mats or decals in the tub or shower. If you need to sit down in the shower, use a plastic, non-slip stool. Keep the floor dry. Clean up any water that spills on the floor as soon as it happens. Remove soap buildup in the tub or shower regularly. Attach bath mats securely with double-sided non-slip rug tape. Do not have throw rugs and other things on the floor that can make you trip. What can I do in the bedroom? Use night lights. Make sure that you have a light by your bed that is easy to reach. Do not use any sheets or blankets that are too big for your bed. They should not hang down onto the floor. Have a firm chair that has side arms. You can use this for support while you get dressed. Do not have throw rugs and other things on the floor that can make you trip. What can I do in the kitchen? Clean up any spills right away. Avoid walking on wet floors. Keep items that you use a lot in easy-to-reach places. If you need to reach something above you, use a strong step stool that has a grab bar. Keep electrical cords out of the way. Do not use floor polish or wax that makes floors slippery. If you must use wax, use non-skid floor  wax. Do not have throw rugs and other things on the floor that can make you trip. What can I do with my stairs? Do not leave any items on the stairs. Make sure that there are handrails on both sides of the stairs and use them. Fix handrails that are broken or loose. Make sure that handrails are as long as the stairways. Check any carpeting to make sure that it is firmly attached to the stairs. Fix any carpet that is loose or worn. Avoid having throw rugs at the top or bottom of the stairs. If you do have throw rugs, attach them to the floor with carpet tape. Make sure that you have a light switch at the top of the stairs and the bottom of the stairs. If you do not have them, ask someone to add them for you. What else can I do to help prevent falls? Wear shoes that: Do not have high  heels. Have rubber bottoms. Are comfortable and fit you well. Are closed at the toe. Do not wear sandals. If you use a stepladder: Make sure that it is fully opened. Do not climb a closed stepladder. Make sure that both sides of the stepladder are locked into place. Ask someone to hold it for you, if possible. Clearly mark and make sure that you can see: Any grab bars or handrails. First and last steps. Where the edge of each step is. Use tools that help you move around (mobility aids) if they are needed. These include: Canes. Walkers. Scooters. Crutches. Turn on the lights when you go into a dark area. Replace any light bulbs as soon as they burn out. Set up your furniture so you have a clear path. Avoid moving your furniture around. If any of your floors are uneven, fix them. If there are any pets around you, be aware of where they are. Review your medicines with your doctor. Some medicines can make you feel dizzy. This can increase your chance of falling. Ask your doctor what other things that you can do to help prevent falls. This information is not intended to replace advice given to you by your  health care provider. Make sure you discuss any questions you have with your health care provider. Document Released: 11/15/2008 Document Revised: 06/27/2015 Document Reviewed: 02/23/2014 Elsevier Interactive Patient Education  2017 [employer].

## 2022-09-22 NOTE — Telephone Encounter (Addendum)
Called patient to perform her AWV. During the appointment patient complained of lower back pain that started Saturday.  Patient stated that the pain today has been about a pain level of 5. Patient denies any fever or urinary symptoms. Patient stated that she has had a kidney removed and multiple back surgeries. Patient was offered an appointment which she declined. Patient stated that she has an appointment scheduled with Dr. Clent Ridges 10/01/22 and does not want to see anyone else.  Patient was given ER precautions and she verbalized understanding.

## 2022-09-23 DIAGNOSIS — J441 Chronic obstructive pulmonary disease with (acute) exacerbation: Secondary | ICD-10-CM | POA: Diagnosis not present

## 2022-09-28 ENCOUNTER — Telehealth: Payer: Self-pay | Admitting: Gastroenterology

## 2022-09-28 NOTE — Telephone Encounter (Signed)
Spoken to patient. She wanted Dr Tobi Bastos to know she had a really bad episode last night. It felt like the food kept wanting to come back up. It lasted for a while until 11 pm. Usually these episode only last 20-30 minutes.  At dinner she had white sauce over hibachi steak and shrimp with vegetables, fried rice and a salad.   Patient stated that is there anything that she can take in addition to what she already taken that can prevent this from happening again.

## 2022-09-28 NOTE — Telephone Encounter (Signed)
Patient called in because she stated that she is having real bad acid reflux. Patient stated all the medicine that she takes is not even helping. She is looking forward for a call on what to do.

## 2022-09-29 DIAGNOSIS — N2581 Secondary hyperparathyroidism of renal origin: Secondary | ICD-10-CM | POA: Diagnosis not present

## 2022-09-29 DIAGNOSIS — Z905 Acquired absence of kidney: Secondary | ICD-10-CM | POA: Diagnosis not present

## 2022-09-29 DIAGNOSIS — I129 Hypertensive chronic kidney disease with stage 1 through stage 4 chronic kidney disease, or unspecified chronic kidney disease: Secondary | ICD-10-CM | POA: Diagnosis not present

## 2022-09-29 DIAGNOSIS — N182 Chronic kidney disease, stage 2 (mild): Secondary | ICD-10-CM | POA: Diagnosis not present

## 2022-09-29 DIAGNOSIS — I10 Essential (primary) hypertension: Secondary | ICD-10-CM | POA: Diagnosis not present

## 2022-09-29 NOTE — Telephone Encounter (Signed)
Noted  

## 2022-09-29 NOTE — Telephone Encounter (Signed)
Patient is currently taking pantoprazole 40 mg daily.  She also takes sucralfate 1 g and Zofran 4 mg

## 2022-09-30 MED ORDER — FAMOTIDINE 20 MG PO TABS: 20.0000 mg | ORAL_TABLET | Freq: Every day | ORAL | 2 refills | Status: DC

## 2022-09-30 NOTE — Telephone Encounter (Signed)
Can icnrease pantoprazole to BID and add famotidine at night , ask her to let us know how she is doing in 2 weeks

## 2022-09-30 NOTE — Telephone Encounter (Signed)
Spoken to patient and she will take pantoprazole BID. I have send famotidine 20 mg to [facility] in [place].  Patient verbalized understanding to update [place] in 2 weeks.

## 2022-09-30 NOTE — Addendum Note (Signed)
Addended by: Tawnya Crook on: 09/30/2022 09:56 AM   Modules accepted: Orders

## 2022-10-01 ENCOUNTER — Encounter: Payer: Self-pay | Admitting: Family Medicine

## 2022-10-01 ENCOUNTER — Other Ambulatory Visit: Payer: Self-pay

## 2022-10-01 ENCOUNTER — Ambulatory Visit: Payer: Medicare PPO | Admitting: Family Medicine

## 2022-10-01 VITALS — BP 118/62 | HR 70 | Temp 97.7°F | Resp 16 | Ht 59.0 in | Wt 166.5 lb

## 2022-10-01 DIAGNOSIS — I130 Hypertensive heart and chronic kidney disease with heart failure and stage 1 through stage 4 chronic kidney disease, or unspecified chronic kidney disease: Secondary | ICD-10-CM | POA: Diagnosis not present

## 2022-10-01 DIAGNOSIS — Z9981 Dependence on supplemental oxygen: Secondary | ICD-10-CM | POA: Diagnosis not present

## 2022-10-01 DIAGNOSIS — I5032 Chronic diastolic (congestive) heart failure: Secondary | ICD-10-CM

## 2022-10-01 NOTE — Progress Notes (Signed)
SUBJECTIVE:   Chief Complaint  Patient presents with   Medical Management of Chronic Issues   HPI Presents to clinic for 4 week follow up  Patient last seen 06/26 for hospitalization follow up.  Since then she reports feeling good.  Breathing has returned to baseline. Compliant with current medications.  Has followed up with Pulmonology and no change in medications.  Has follow up with Cardiology tomorrow.  Continues to remain on 3L oxygen via N/[place].  Has not had to increase use.  Denies any increase in lower extremity edema.   PERTINENT PMH / PSH: COPD CHF Mood Disorder  OBJECTIVE:  BP 118/62   Pulse 70   Temp 97.7 F (36.5 C)   Resp 16   Ht 4\' 11"  (1.499 m)   Wt 166 lb 8 oz (75.5 kg)   SpO2 97%   BMI 33.63 kg/m    Physical Exam Vitals reviewed.  Constitutional:      General: She is not in acute distress.    Appearance: Normal appearance. She is obese. She is not ill-appearing, toxic-appearing or diaphoretic.  Eyes:     General:        Right eye: No discharge.        Left eye: No discharge.     Conjunctiva/sclera: Conjunctivae normal.  Cardiovascular:     Rate and Rhythm: Normal rate and regular rhythm.     Heart sounds: Normal heart sounds.  Pulmonary:     Breath sounds: Normal breath sounds. No wheezing or rhonchi.     Comments: Oxygen dependent on 2 L n/c Musculoskeletal:        General: Normal range of motion.     Right lower leg: Edema (trace) present.     Left lower leg: Edema (trace) present.  Skin:    General: Skin is warm and dry.  Neurological:     General: No focal deficit present.     Mental Status: She is alert and oriented to person, place, and time. Mental status is at baseline.  Psychiatric:        Mood and Affect: Mood normal.        Behavior: Behavior normal.        Thought Content: Thought content normal.        Judgment: Judgment normal.        09/22/2022    3:05 PM 07/29/2022    4:06 PM 05/13/2022   11:40 AM 01/06/2022    2:18  PM 09/24/2021    2:06 PM  Depression screen PHQ 2/9  Decreased Interest 0 0 0 0 0  Down, Depressed, Hopeless 1 1 0 0 0  PHQ - 2 Score 1 1 0 0 0  Altered sleeping  3     Tired, decreased energy 3 3     Change in appetite 1 3     Feeling bad or failure about yourself  0 0     Trouble concentrating 0 0     Moving slowly or fidgety/restless 0 0     Suicidal thoughts 0 0     PHQ-9 Score  10         07/29/2022    4:06 PM 05/13/2022   11:40 AM 10/22/2021    2:54 PM 09/02/2021   11:54 AM  GAD 7 : Generalized Anxiety Score  Nervous, Anxious, on Edge 1 0 0 0  Control/stop worrying 3 0 0 0  Worry too much - different things 3 0 0 0  Trouble  relaxing 3 0 0 0  Restless 1 0 0 0  Easily annoyed or irritable 2 0 0 0  Afraid - awful might happen 1 0 0 0  Total GAD 7 Score 14 0 0 0  Anxiety Difficulty  Not difficult at all Not difficult at all Not difficult at all    ASSESSMENT/PLAN:  Chronic heart failure with preserved ejection fraction [name]) Assessment & Plan: Chronic. Does not appear overloaded on exam today. Taking Torsemide 20 mg daily.  Prescribed 40 mg. Recommend she discuss with Cardiology at tomorrows appointment Continue to follow up with Cardiology as scheduled Recommend to not self decrease diuretics Follow up as needed   Oxygen dependent Assessment & Plan: Continue to follow up with Pulmonology   Hypertensive heart and chronic kidney disease with heart failure and stage 1 through stage 4 chronic kidney disease, or unspecified chronic kidney disease (HCC) Assessment & Plan: Chronic Blood pressure well controlled Continue current medications Follow up with Cardiology, Nephrology    PDMP reviewed  Return in about 6 months (around 04/02/2023).  Dana Allan, MD

## 2022-10-01 NOTE — Patient Instructions (Addendum)
It was a pleasure meeting you today. Thank you for allowing me to take part in your health care.  Our goals for today as we discussed include:  Glad you are feeling better  Follow up with Cardiology tomorrow.    Follow up with Pulmonology as scheduled  Follow up with Nephrology as scheduled  Follow up with GI as scheduled  Follow up with Psychiatry as scheduled  Follow up with PCP in 6 months   If you have any questions or concerns, please do not hesitate to call the office at [phone].  I look forward to our next visit and until then take care and stay safe.  Regards,   Dana Allan, MD   [facility]

## 2022-10-02 ENCOUNTER — Other Ambulatory Visit: Payer: Self-pay | Admitting: Gastroenterology

## 2022-10-02 DIAGNOSIS — I480 Paroxysmal atrial fibrillation: Secondary | ICD-10-CM | POA: Diagnosis not present

## 2022-10-02 DIAGNOSIS — I5032 Chronic diastolic (congestive) heart failure: Secondary | ICD-10-CM | POA: Diagnosis not present

## 2022-10-02 DIAGNOSIS — I340 Nonrheumatic mitral (valve) insufficiency: Secondary | ICD-10-CM | POA: Diagnosis not present

## 2022-10-02 DIAGNOSIS — I10 Essential (primary) hypertension: Secondary | ICD-10-CM | POA: Diagnosis not present

## 2022-10-02 MED ORDER — DICYCLOMINE HCL 10 MG PO CAPS: 10.0000 mg | ORAL_CAPSULE | Freq: Two times a day (BID) | ORAL | 0 refills | Status: DC | PRN

## 2022-10-06 DIAGNOSIS — M810 Age-related osteoporosis without current pathological fracture: Secondary | ICD-10-CM | POA: Diagnosis not present

## 2022-10-08 ENCOUNTER — Other Ambulatory Visit: Payer: Self-pay | Admitting: Gastroenterology

## 2022-10-08 ENCOUNTER — Other Ambulatory Visit: Payer: Self-pay | Admitting: Pulmonary Disease

## 2022-10-14 ENCOUNTER — Encounter: Payer: Self-pay | Admitting: Student in an Organized Health Care Education/Training Program

## 2022-10-14 ENCOUNTER — Ambulatory Visit
Payer: Medicare PPO | Attending: Student in an Organized Health Care Education/Training Program | Admitting: Student in an Organized Health Care Education/Training Program

## 2022-10-14 VITALS — BP 120/75 | HR 72 | Temp 97.3°F | Resp 16 | Ht 59.5 in | Wt 166.0 lb

## 2022-10-14 DIAGNOSIS — G8929 Other chronic pain: Secondary | ICD-10-CM | POA: Insufficient documentation

## 2022-10-14 DIAGNOSIS — M19012 Primary osteoarthritis, left shoulder: Secondary | ICD-10-CM | POA: Diagnosis not present

## 2022-10-14 DIAGNOSIS — M19011 Primary osteoarthritis, right shoulder: Secondary | ICD-10-CM | POA: Insufficient documentation

## 2022-10-14 DIAGNOSIS — M25511 Pain in right shoulder: Secondary | ICD-10-CM | POA: Diagnosis not present

## 2022-10-14 DIAGNOSIS — M25512 Pain in left shoulder: Secondary | ICD-10-CM | POA: Diagnosis not present

## 2022-10-14 DIAGNOSIS — G894 Chronic pain syndrome: Secondary | ICD-10-CM | POA: Insufficient documentation

## 2022-10-14 NOTE — Progress Notes (Signed)
Safety precautions to be maintained throughout the outpatient stay will include: orient to surroundings, keep bed in low position, maintain call bell within reach at all times, provide assistance with transfer out of bed and ambulation.  

## 2022-10-14 NOTE — Patient Instructions (Signed)
Pain Management Discharge Instructions  General Discharge Instructions :  If you need to reach your doctor call: Monday-Friday 8:00 am - 4:00 pm at [phone] or toll free [phone].  After clinic hours [phone] to have operator reach doctor.  Bring all of your medication bottles to all your appointments in the pain clinic.  To cancel or reschedule your appointment with Pain Management please remember to call 24 hours in advance to avoid a fee.  Refer to the educational materials which you have been given on: General Risks, I had my Procedure. Discharge Instructions, Post Sedation.  Post Procedure Instructions:  The drugs you were given will stay in your system until tomorrow, so for the next 24 hours you should not drive, make any legal decisions or drink any alcoholic beverages.  You may eat anything you prefer, but it is better to start with liquids then soups and crackers, and gradually work up to solid foods.  Please notify your doctor immediately if you have any unusual bleeding, trouble breathing or pain that is not related to your normal pain.  Depending on the type of procedure that was done, some parts of your body may feel week and/or numb.  This usually clears up by tonight or the next day.  Walk with the use of an assistive device or accompanied by an adult for the 24 hours.  You may use ice on the affected area for the first 24 hours.  Put ice in a Ziploc bag and cover with a towel and place against area 15 minutes on 15 minutes off.  You may switch to heat after 24 hours. 

## 2022-10-14 NOTE — Progress Notes (Signed)
PROVIDER NOTE: Information contained herein reflects review and annotations entered in association with encounter. Interpretation of such information and data should be left to medically-trained personnel. Information provided to patient can be located elsewhere in the medical record under "Patient Instructions". Document created using STT-dictation technology, any transcriptional errors that may result from process are unintentional.    Patient: Kerri Carter  Service Category: E/M  Provider: Edward Jolly, MD  DOB: [birth year]  DOS: 10/14/2022  Referring Provider: Dana Allan, MD  MRN: [id number]  Specialty: Interventional Pain Management  PCP: Dana Allan, MD  Type: Established Patient  Setting: Ambulatory outpatient    Location: Office  Delivery: Face-to-face     HPI  Kerri Carter, a 82 y.o. year old female, is here today because of her Chronic pain of both shoulders [M25.511, G89.29, M25.512]. Kerri Carter's primary complain today is Shoulder Pain (bilateral)  Pertinent problems: Kerri Carter has COPD with acute exacerbation (HCC); OSA (obstructive sleep apnea); Rheumatoid arthritis (HCC); Stage 3b chronic kidney disease (HCC); PAD (peripheral artery disease) (HCC); Drug-induced osteoporosis; Osteoarthritis of knee; and Lumbar radiculopathy on their pertinent problem list. Pain Assessment: Severity of Chronic pain is reported as a 0-No pain/10. Location: Shoulder Right, Left/denies, able to move shoulders better. Onset: More than a month ago. Quality: Throbbing. Timing: Intermittent. Modifying factor(s): meds. Vitals:  height is 4' 11.5" (1.511 m) and weight is 166 lb (75.3 kg). Her temperature is 97.3 F (36.3 C) (abnormal). Her blood pressure is 120/75 and her pulse is 72. Her respiration is 16 and oxygen saturation is 100%.  BMI: Estimated body mass index is 32.97 kg/m as calculated from the following:   Height as of this encounter: 4' 11.5" (1.511 m).   Weight as of this encounter: 166 lb  (75.3 kg). Last encounter: 07/28/2022. Last procedure: 09/14/2022.  Reason for encounter: post-procedure evaluation and assessment.    Post-procedure evaluation   Procedure: Suprascapular nerve block (SSNB) #1  Laterality:  Bilateral  Level: Superior to scapular spine, lateral to supraspinatus fossa (Suprascapular notch).  Imaging: Fluoroscopic guidance         Anesthesia: Local anesthesia (1-2% Lidocaine) DOS: 09/14/2022  Performed by: Edward Jolly, MD  Purpose: Diagnostic/Therapeutic Indications: Shoulder pain, severe enough to impact quality of life and/or function. 1. Chronic pain of both shoulders   2. Primary osteoarthritis of both shoulders   3. Chronic pain syndrome    NAS-11 score:   Pre-procedure: 7 /10   Post-procedure: 2 /10      Effectiveness:  Initial hour after procedure: 100 %  Subsequent 4-6 hours post-procedure: 100 %  Analgesia past initial 6 hours: 100 %  Ongoing improvement:  Analgesic:  100% Function: Kerri Carter reports improvement in function ROM: Kerri Carter reports improvement in ROM   ROS  Constitutional: Denies any fever or chills Gastrointestinal: No reported hemesis, hematochezia, vomiting, or acute GI distress Musculoskeletal: Denies any acute onset joint swelling, redness, loss of ROM, or weakness Neurological: No reported episodes of acute onset apraxia, aphasia, dysarthria, agnosia, amnesia, paralysis, loss of coordination, or loss of consciousness  Medication Review  Fluticasone-Umeclidin-Vilant, Oxygen-Helium, QUEtiapine, Vibegron, albuterol, amLODipine, apixaban, busPIRone, cetirizine, denosumab, dicyclomine, escitalopram, etanercept, famotidine, gabapentin, ipratropium-albuterol, lamoTRIgine, leflunomide, lidocaine, lovastatin, methocarbamol, mometasone, montelukast, multivitamin-lutein, nebivolol, ondansetron, pantoprazole, predniSONE, sucralfate, torsemide, and traMADol  History Review  Allergy: Ms. Witherow is allergic to ceftin  [cefuroxime axetil]; lisinopril; sulfa antibiotics; sulfasalazine; morphine; xarelto [rivaroxaban]; adhesive [tape]; antihistamines, chlorpheniramine-type; antivert [meclizine hcl]; aspirin; contrast media [iodinated contrast media]; decongestant [pseudoephedrine hcl];  doxycycline; levaquin [levofloxacin in d5w]; polymyxin b; and tetanus toxoids. Drug: Ms. Allegra  reports no history of drug use. Alcohol:  reports no history of alcohol use. Tobacco:  reports that she quit smoking about 48 years ago. Her smoking use included cigarettes. She started smoking about 68 years ago. She has a 10 pack-year smoking history. She has never used smokeless tobacco. Social: Kerri Carter  reports that she quit smoking about 48 years ago. Her smoking use included cigarettes. She started smoking about 68 years ago. She has a 10 pack-year smoking history. She has never used smokeless tobacco. She reports that she does not drink alcohol and does not use drugs. Medical:  has a past medical history of Acromial process of scapula fracture (12/21/2019), Acute kidney injury superimposed on chronic kidney disease (HCC) (05/26/2021), Acute on chronic combined systolic (congestive) and diastolic (congestive) heart failure (HCC) (07/28/2021), Acute on chronic combined systolic and diastolic CHF (congestive heart failure) (HCC) (05/26/2021), Acute on chronic respiratory failure with hypoxia (HCC) (09/10/2021), Acute pain of left knee (12/18/2020), Anemia in stage 3a chronic kidney disease (HCC) (10/09/2021), Anxiety, Anxiety (01/20/2019), Anxiety and depression (05/26/2021), Aortic stenosis (07/09/2015), Arrhythmia, Assistance needed for mobility (12/18/2020), Asthma, At high risk for falls (03/16/2019), Avascular necrosis of left shoulder due to adverse effect of steroid therapy (HCC) (01/20/2018), Bipolar disorder (HCC), C. difficile diarrhea (2010), Carotid atherosclerosis, bilateral, Cataract, CHF (congestive heart failure) (HCC), Chicken  pox, Chronic left shoulder pain (08/29/2019), Chronic respiratory failure with hypoxia (HCC) (10/01/2021), CKD (chronic kidney disease), stage III (HCC), Closed fracture of acromial process of scapula (10/28/2018), Closed nondisplaced fracture of proximal phalanx of lesser toe of right foot, Collapsed vertebra, not elsewhere classified, lumbar region, initial encounter for fracture (HCC) (02/21/2019), Community acquired pneumonia (07/22/2021), Complicated grief (03/16/2019), Compression fracture of L4 vertebra (HCC) (02/21/2019), Conversion disorder, Conversion disorder (08/04/2015), COPD (chronic obstructive pulmonary disease) (HCC), COPD with exacerbation (HCC) (08/20/2021), Cough (05/02/2012), Depression, Dislocation of hip joint prosthesis (HCC) (01/08/2017), Elevated brain natriuretic peptide (BNP) level (12/18/2020), Elevated d-dimer (05/26/2021), Episodic mood disorder (HCC) (04/18/2019), Essential hypertension, Fatigue (07/07/2018), Frequent falls (12/18/2020), GERD (gastroesophageal reflux disease), H/O cardiac catheterization (02/24/2011), Hav (hallux abducto valgus), unspecified laterality (12/05/2018), Heart murmur, Hip fracture (HCC) (10/31/2016), Hip pain (12/18/2020), History of fracture of left hip (01/15/2017), History of syncope (08/25/2016), Hyperkalemia (05/26/2021), Hyperlipidemia, Hypokalemia (08/25/2016), ILD (interstitial lung disease) (HCC), Iliopsoas bursitis of right hip (08/29/2019), Inflammatory arthritis, Insomnia (07/17/2015), Intractable nausea and vomiting (08/10/2022), Left foot pain (10/19/2016), Left lower lobe pneumonia (05/26/2021), Leg edema (05/02/2019), Leg pain, bilateral, Leg swelling (12/15/2016), Leukocytosis (10/19/2017), Leukopenia (06/24/2021), Lymphocytosis (07/13/2021), Macular degeneration, Microcytic anemia (08/15/2015), Mixed bipolar I disorder (HCC) (10/09/2015), Nocturnal hypoxemia, Nocturnal hypoxemia (09/09/2011), Non-Obstructive CAD, NSIP (nonspecific  interstitial pneumonitis) (HCC) (12/08/2019), Osteoarthritis, Other shoulder lesions, left shoulder (12/20/2017), Other specified postprocedural states (07/20/2013), Overweight (BMI 25.0-29.9) (08/29/2019), PAD (peripheral artery disease) (HCC), Pain due to onychomycosis of toenails of both feet (12/05/2018), Pelvic mass in female (03/16/2019), Physical deconditioning (02/27/2021), Pleural effusion on left (12/18/2020), PUD (peptic ulcer disease), Recurrent falls (09/01/2021), Recurrent pneumonia (07/23/2021), Repeated falls (12/18/2020), Respiratory failure with hypoxia (HCC) (01/12/2021), Rotator cuff tendinitis, right (07/26/2017), S/P lumbar fusion (05/23/2021), S/P right hip fracture, Sepsis due to pneumonia (HCC) (07/22/2021), Shoulder pain, Sleep apnea, SOB (shortness of breath) (10/13/2011), Spinal stenosis at L4-L5 level, Spinal stenosis of lumbar region (09/15/2016), Stage 3a chronic kidney disease (CKD) (HCC) (07/23/2021), Status post hip hemiarthroplasty (01/08/2017), Status post lumbar spinal fusion (01/15/2017), Status post reverse arthroplasty of shoulder, left (07/26/2018), Status post reverse  total shoulder replacement, right (11/04/2017), Strain of right hip (02/26/2017), Thrombocytosis (06/24/2021), Toe fracture, right (09/09/2021), Toxic maculopathy, 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 Surgical: Ms. Carper  has a past surgical history that includes Nephrectomy (1988); Tubal ligation; Appendectomy; Tonsillectomy; Cesarean section; Bunionectomy (Right); Total hip arthroplasty (12/10/11); Colonoscopy with propofol (N/A, 04/02/2016); Esophagogastroduodenoscopy (egd) with propofol (N/A, 04/02/2016); Esophageal dilation (04/02/2016); Endoscopic retrograde cholangiopancreatography (ercp) with propofol (N/A, 05/08/2016); Cholecystectomy (N/A, 05/11/2016); Hip Arthroplasty (Right, 11/01/2016); Replacement total  knee (Right); Total hip arthroplasty (Bilateral); Cataract extraction, bilateral; osteoporosis; ERCP; Reverse shoulder arthroplasty (Right, 11/04/2017); Reverse shoulder arthroplasty (Left, 07/26/2018); Back surgery; ORIF scaphoid fracture (Left, 12/21/2019); Lumbar laminectomy/decompression microdiscectomy (N/A, 12/11/2020); Transforaminal lumbar interbody fusion (tlif) with pedicle screw fixation 3 level (N/A, 05/23/2021); and Application of intraoperative CT scan (N/A, 05/23/2021). Family: family history includes Alcohol abuse in her brother; Asthma in her mother and sister; Breast cancer in her sister; Cancer in her maternal grandmother; Colon cancer in her maternal grandmother; Dementia in her son; Depression in her brother; Diabetes in her maternal grandmother and son; Gout in her son; Heart attack in her father and sister; Heart disease in her brother, brother, father, maternal grandmother, mother, sister, sister, and sister; Hypertension in her father and mother; Lung cancer in her sister; Parkinson's disease in her mother; Peripheral Artery Disease in her father; Rheum arthritis in her mother; Stroke in her mother.  Laboratory Chemistry Profile   Renal Lab Results  Component Value Date   BUN 17 08/14/2022   CREATININE 0.94 08/14/2022   BCR 11 11/22/2020   GFR 35.66 (L) 07/03/2021   GFRAA 39 (L) 02/21/2019   GFRNONAA >60 08/14/2022    Hepatic Lab Results  Component Value Date   AST 28 08/10/2022   ALT 18 08/10/2022   ALBUMIN 4.5 08/10/2022   ALKPHOS 96 08/10/2022   LIPASE 78 (H) 08/10/2022    Electrolytes Lab Results  Component Value Date   NA 137 08/14/2022   K 4.0 08/14/2022   CL 101 08/14/2022   CALCIUM 8.7 (L) 08/14/2022   MG 2.1 08/13/2022   PHOS 4.2 04/07/2022    Bone Lab Results  Component Value Date   VD25OH 56.64 04/02/2020    Inflammation (CRP: Acute Phase) (ESR: Chronic Phase) Lab Results  Component Value Date   CRP 0.9 07/17/2022   ESRSEDRATE 10 07/03/2013    LATICACIDVEN 1.4 03/28/2022         Note: Above Lab results reviewed.   Physical Exam  General appearance: Well nourished, well developed, and well hydrated. In no apparent acute distress Mental status: Alert, oriented x 3 (person, place, & time)       Respiratory: No evidence of acute respiratory distress Eyes: PERLA Vitals: BP 120/75   Pulse 72   Temp (!) 97.3 F (36.3 C)   Resp 16   Ht 4' 11.5" (1.511 m)   Wt 166 lb (75.3 kg)   SpO2 100% Comment: 3L  BMI 32.97 kg/m  BMI: Estimated body mass index is 32.97 kg/m as calculated from the following:   Height as of this encounter: 4' 11.5" (1.511 m).   Weight as of this encounter: 166 lb (75.3 kg). Ideal: Patient must be at least 60 in tall to calculate ideal body weight  Significant improvement in bilateral shoulder pain and range of motion  Assessment   Diagnosis Status  1. Chronic pain of both shoulders   2. Primary osteoarthritis of both shoulders  3. Chronic pain syndrome    Controlled Controlled Controlled   Updated Problems: No problems updated.  Plan of Care  100% pain relief after suprascapular nerve block.  Significant improvement in shoulder range of motion specifically her right shoulder.  Repeat suprascapular nerve block as needed.   Orders:  Orders Placed This Encounter  Procedures   SUPRASCAPULAR NERVE BLOCK    For shoulder pain.    Standing Status:   Standing    Number of Occurrences:   2    Standing Expiration Date:   10/14/2023    Scheduling Instructions:     Purpose: Diagnostic     Laterality: Bilateral     Level(s): Suprascapular notch     Sedation: Patient's choice.     Scheduling Timeframe: PRN    Order Specific Question:   Where will this procedure be performed?    Answer:   [facility]   Follow-up plan:   Return if symptoms worsen or fail to improve.      Recent Visits Date Type Provider Dept  09/14/22 Procedure visit Edward Jolly, MD [facility]  07/28/22  Office Visit Edward Jolly, MD [facility]  Showing recent visits within past 90 days and meeting all other requirements Today's Visits Date Type Provider Dept  10/14/22 Office Visit Edward Jolly, MD [facility]  Showing today's visits and meeting all other requirements Future Appointments No visits were found meeting these conditions. Showing future appointments within next 90 days and meeting all other requirements  I discussed the assessment and treatment plan with the patient. The patient was provided an opportunity to ask questions and all were answered. The patient agreed with the plan and demonstrated an understanding of the instructions.  Patient advised to call back or seek an in-person evaluation if the symptoms or condition worsens.  Duration of encounter: 15 minutes.  Total time on encounter, as per AMA guidelines included both the face-to-face and non-face-to-face time personally spent by the physician and/or other qualified health care professional(s) on the day of the encounter (includes time in activities that require the physician or other qualified health care professional and does not include time in activities normally performed by clinical staff). Physician's time may include the following activities when performed: Preparing to see the patient (e.g., pre-charting review of records, searching for previously ordered imaging, lab work, and nerve conduction tests) Review of prior analgesic pharmacotherapies. Reviewing PMP Interpreting ordered tests (e.g., lab work, imaging, nerve conduction tests) Performing post-procedure evaluations, including interpretation of diagnostic procedures Obtaining and/or reviewing separately obtained history Performing a medically appropriate examination and/or evaluation Counseling and educating the patient/family/caregiver Ordering medications, tests, or procedures Referring and communicating with other health care  professionals (when not separately reported) Documenting clinical information in the electronic or other health record Independently interpreting results (not separately reported) and communicating results to the patient/ family/caregiver Care coordination (not separately reported)  Note by: Edward Jolly, MD Date: 10/14/2022; Time: 1:52 PM

## 2022-10-16 DIAGNOSIS — F3170 Bipolar disorder, currently in remission, most recent episode unspecified: Secondary | ICD-10-CM | POA: Diagnosis not present

## 2022-10-16 DIAGNOSIS — F419 Anxiety disorder, unspecified: Secondary | ICD-10-CM | POA: Diagnosis not present

## 2022-10-21 ENCOUNTER — Encounter: Payer: Self-pay | Admitting: Family Medicine

## 2022-10-21 NOTE — Assessment & Plan Note (Addendum)
Chronic. Does not appear overloaded on exam today. Taking Torsemide 20 mg daily.  Prescribed 40 mg. Recommend she discuss with Cardiology at tomorrows appointment Continue to follow up with Cardiology as scheduled Recommend to not self decrease diuretics Follow up as needed

## 2022-10-21 NOTE — Assessment & Plan Note (Signed)
Chronic Blood pressure well controlled Continue current medications Follow up with Cardiology, Nephrology

## 2022-10-21 NOTE — Assessment & Plan Note (Signed)
Continue to follow up with Pulmonology

## 2022-10-24 DIAGNOSIS — J441 Chronic obstructive pulmonary disease with (acute) exacerbation: Secondary | ICD-10-CM | POA: Diagnosis not present

## 2022-11-01 ENCOUNTER — Other Ambulatory Visit: Payer: Self-pay | Admitting: Gastroenterology

## 2022-11-01 DIAGNOSIS — R1031 Right lower quadrant pain: Secondary | ICD-10-CM

## 2022-11-05 ENCOUNTER — Other Ambulatory Visit: Payer: Self-pay

## 2022-11-05 ENCOUNTER — Telehealth: Payer: Self-pay | Admitting: Family Medicine

## 2022-11-05 DIAGNOSIS — G8921 Chronic pain due to trauma: Secondary | ICD-10-CM

## 2022-11-05 NOTE — Telephone Encounter (Signed)
Patient just called and said she is out of her medication. gabapentin (NEURONTIN) 300 MG capsule. The pharmacy she uses is [id number] - [place], [place] - [address] [place] [address] Phone: [phone]  Fax: [phone]  She said she needs it as soon as possible she is out. Her number is [phone]

## 2022-11-05 NOTE — Telephone Encounter (Signed)
RX sent to Dr. Clent RidgesWalsh

## 2022-11-06 ENCOUNTER — Other Ambulatory Visit: Payer: Self-pay | Admitting: Family Medicine

## 2022-11-06 MED ORDER — GABAPENTIN 300 MG PO CAPS
ORAL_CAPSULE | ORAL | Status: DC
Start: 2022-11-06 — End: 2022-11-09

## 2022-11-09 ENCOUNTER — Other Ambulatory Visit: Payer: Self-pay

## 2022-11-09 DIAGNOSIS — M0579 Rheumatoid arthritis with rheumatoid factor of multiple sites without organ or systems involvement: Secondary | ICD-10-CM | POA: Diagnosis not present

## 2022-11-09 DIAGNOSIS — G8921 Chronic pain due to trauma: Secondary | ICD-10-CM

## 2022-11-09 DIAGNOSIS — Z79899 Other long term (current) drug therapy: Secondary | ICD-10-CM | POA: Diagnosis not present

## 2022-11-09 DIAGNOSIS — J849 Interstitial pulmonary disease, unspecified: Secondary | ICD-10-CM | POA: Diagnosis not present

## 2022-11-09 MED ORDER — GABAPENTIN 300 MG PO CAPS
ORAL_CAPSULE | ORAL | 1 refills | Status: DC
Start: 2022-11-09 — End: 2023-01-11

## 2022-11-09 NOTE — Telephone Encounter (Signed)
Pt called wanting an update on her gabapentin

## 2022-11-09 NOTE — Telephone Encounter (Signed)
Medication sent to the pharmacy.

## 2022-11-11 ENCOUNTER — Encounter: Payer: Self-pay | Admitting: Oncology

## 2022-11-12 ENCOUNTER — Other Ambulatory Visit: Payer: Self-pay | Admitting: Pulmonary Disease

## 2022-11-13 DIAGNOSIS — F3170 Bipolar disorder, currently in remission, most recent episode unspecified: Secondary | ICD-10-CM | POA: Diagnosis not present

## 2022-11-13 DIAGNOSIS — F419 Anxiety disorder, unspecified: Secondary | ICD-10-CM | POA: Diagnosis not present

## 2022-11-17 ENCOUNTER — Ambulatory Visit: Payer: Medicare PPO | Admitting: Pulmonary Disease

## 2022-11-17 ENCOUNTER — Encounter: Payer: Self-pay | Admitting: Pulmonary Disease

## 2022-11-17 VITALS — BP 120/80 | HR 82 | Temp 97.8°F | Ht 59.5 in | Wt 175.0 lb

## 2022-11-17 DIAGNOSIS — Z23 Encounter for immunization: Secondary | ICD-10-CM

## 2022-11-17 DIAGNOSIS — J4489 Other specified chronic obstructive pulmonary disease: Secondary | ICD-10-CM

## 2022-11-17 DIAGNOSIS — M0579 Rheumatoid arthritis with rheumatoid factor of multiple sites without organ or systems involvement: Secondary | ICD-10-CM

## 2022-11-17 DIAGNOSIS — I350 Nonrheumatic aortic (valve) stenosis: Secondary | ICD-10-CM

## 2022-11-17 DIAGNOSIS — J8489 Other specified interstitial pulmonary diseases: Secondary | ICD-10-CM

## 2022-11-17 DIAGNOSIS — J9611 Chronic respiratory failure with hypoxia: Secondary | ICD-10-CM

## 2022-11-17 DIAGNOSIS — I080 Rheumatic disorders of both mitral and aortic valves: Secondary | ICD-10-CM

## 2022-11-17 MED ORDER — ALBUTEROL SULFATE (2.5 MG/3ML) 0.083% IN NEBU: 2.5000 mg | INHALATION_SOLUTION | Freq: Four times a day (QID) | RESPIRATORY_TRACT | 12 refills | Status: DC | PRN

## 2022-11-17 NOTE — Patient Instructions (Signed)
VISIT SUMMARY:  During your visit, we discussed your recent onset of shortness of breath and leg swelling, which may be related to your heart valve issues. We also talked about your respiratory symptoms, which have been well-managed with Trelegy. We discussed your arthritis and the need for your annual flu shot.  YOUR PLAN:  -CARDIAC ISSUES: Your shortness of breath and leg swelling may be signs that your heart condition is worsening. We recommend moving up your cardiology appointment for an echocardiogram, a test that uses sound waves to create pictures of your heart's chambers, valves, walls and the blood vessels.  -COPD: Your chronic obstructive pulmonary disease (COPD) has been well-managed with Trelegy. However, due to your cough and shortness of breath, we are changing your nebulizer solution to [place], which should help improve your symptoms.  -GENERAL HEALTH MAINTENANCE: To protect you from the flu, we gave you a high-dose influenza vaccine today. We also plan to give you a lifelong pneumonia vaccine at your next visit to protect you from pneumonia, a type of lung infection.  INSTRUCTIONS:  Please follow up in 6-8 weeks to assess your response to the new nebulizer medication and to check on your cardiac status. In the meantime, if your symptoms worsen or you have any concerns, please contact our office.

## 2022-11-17 NOTE — Progress Notes (Signed)
Subjective:    Patient ID: Kerri Carter, female    DOB: [birth year], 82 y.o.   MRN: [id number]  Patient Care Team: Dana Allan, MD as PCP - General (Family Medicine) Otho Ket, RN as [facility] Care Management Salena Saner, MD as Consulting Physician (Pulmonary Disease)  Chief Complaint  Patient presents with   Follow-up    Increased DOE. Some wheezing. Dry cough for a week. Mainly when she lays down at night.    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  This is a scheduled visit.  I last saw her on 17 September 2022.  Since her last visit she has not had any admissions to the hospital, no recent exacerbations.  HPI  History of Present Illness   The patient, with a history of heart valve issues, including aortic stenosis and mitral valve leakage, and a stiff left main chamber, presented with a new onset of shortness of breath that started about a week ago. The dyspnea is particularly noticeable when she gets up to walk or go to the bathroom. She also reported swelling in the legs and facial edema, suggesting fluid retention. She is currently on a diuretic medication.  The patient is also under the care of a cardiologist at [facility], Dr. Beryle Beams, and is scheduled for an echocardiogram next month.  In terms of respiratory symptoms, the patient has been managing well with Trelegy, reporting no need for breathing treatments or use of a rescue inhaler. She has a dry cough that worsens when she is short of breath or lying down.  The patient also has a history of arthritis, both osteoarthritis and rheumatoid arthritis. She has not yet received her flu shot for the year.      Review of Systems A 10 point review of systems was performed and it is as noted above otherwise negative.   Patient Active Problem List   Diagnosis Date Noted   AKI (acute  kidney injury) (HCC) 08/10/2022   Intractable nausea and vomiting 08/10/2022   Gastroenteritis 08/10/2022   Pain management contract signed 07/28/2022   Primary osteoarthritis of both shoulders 07/28/2022   Acute CHF (congestive heart failure) (HCC) 07/18/2022   Fluid overload 07/16/2022   Right rib fracture 07/16/2022   Oral candida 05/17/2022   Hyperparathyroidism due to renal insufficiency (HCC) 04/23/2022   Long term (current) use of anticoagulants 04/08/2022   Obesity (BMI 30-39.9) 03/31/2022   NSVT (nonsustained ventricular tachycardia) (HCC) 03/30/2022   Acute urinary retention 03/30/2022   Sepsis due to pneumonia (HCC) 03/28/2022   Demand ischemia (HCC) 03/28/2022   History of major orthopedic surgery 03/19/2022   Iron deficiency anemia due to chronic blood loss 10/09/2021   Chronic heart failure with preserved ejection fraction (HCC) 09/24/2021   Acute on chronic respiratory failure with hypoxia and hypercapnia (HCC) 09/10/2021   Acute on chronic diastolic CHF (congestive heart failure) (HCC) 08/20/2021   Paroxysmal atrial fibrillation (HCC) 07/29/2021   Chronic kidney disease, stage 3a (HCC) 07/23/2021   Hypertensive heart and chronic kidney disease with heart failure and stage 1 through stage 4 chronic kidney disease, or unspecified chronic kidney disease (HCC) 06/02/2021   Irritable bowel syndrome without diarrhea 06/02/2021   Vitamin D deficiency, unspecified 06/02/2021   History of lumbar fusion 05/23/2021   Impaired mobility 12/18/2020   Urinary incontinence 12/18/2020   Lumbar radiculopathy 12/11/2020   Bipolar  disorder, current episode mixed, unspecified (HCC) 11/04/2020   Major depressive disorder, single episode, in full remission (HCC) 11/04/2020   Oxygen dependent 11/04/2020   Chronic pain of both shoulders 08/29/2019   Hypertriglyceridemia 07/24/2019   Bipolar disorder, in full remission, most recent episode depressed (HCC) 07/13/2019   Essential hypertension  06/16/2019   Chronic pain 06/16/2019   Lymphedema 06/05/2019   Chronic venous insufficiency 05/25/2019   PAD (peripheral artery disease) (HCC) 05/25/2019   Interstitial pulmonary disease (HCC) 07/07/2018   Leukocytosis 10/19/2017   Allergic rhinitis 09/28/2017   Age-related osteoporosis without current pathological fracture 11/05/2016   Peripheral neuropathy 10/28/2016   Personal history of nicotine dependence 09/10/2016   Osteoporosis 08/27/2016   Drug-induced osteoporosis 08/27/2016   Stage 3b chronic kidney disease (HCC) 08/25/2016   Adnexal mass 08/25/2016   Prediabetes 08/25/2016   Coronary atherosclerosis 08/25/2016   Hyperlipidemia 07/17/2015   Macular degeneration 07/17/2015   Rheumatoid arthritis (HCC) 12/05/2014   Osteoarthritis of knee 06/07/2013   Shortness of breath 10/13/2011   Valvular heart disease 10/13/2011   COPD with acute exacerbation (HCC) 09/09/2011   OSA (obstructive sleep apnea) 09/09/2011   Gastroesophageal reflux disease 02/24/2011   Acquired absence of kidney 02/24/2011   Renal artery stenosis (HCC) 02/24/2011    Social History   Tobacco Use   Smoking status: Former    Current packs/day: 0.00    Average packs/day: 0.5 packs/day for 20.0 years (10.0 ttl pk-yrs)    Types: Cigarettes    Start date: 02/02/1954    Quit date: 02/02/1974    Years since quitting: 48.8   Smokeless tobacco: Never  Substance Use Topics   Alcohol use: No    Allergies  Allergen Reactions   Ceftin [Cefuroxime Axetil] Anaphylaxis   Lisinopril Anaphylaxis   Sulfa Antibiotics Other (See Comments)    Face swelling   Sulfasalazine Anaphylaxis   Morphine Other (See Comments)    Per patient, low blood pressure issues that requires action to raise it back up. Can take small infrequent doses   Xarelto [Rivaroxaban] Other (See Comments)    Stomach burning, bleeding, and tar in stool   Adhesive [Tape] Rash    Paper tape and tega derm OK   Antihistamines, Chlorpheniramine-Type  Other (See Comments)    Makes pt hyper   Antivert [Meclizine Hcl] Other (See Comments)    Bladder will not empty   Aspirin Other (See Comments)    Sulfasalazine allergy cross reacts   Contrast Media [Iodinated Contrast Media] Rash    she is able to use betadine scrubs.   Decongestant [Pseudoephedrine Hcl] Other (See Comments)    Makes pt hyper   Doxycycline Other (See Comments)    GI upset   Levaquin [Levofloxacin In D5w] Rash   Polymyxin B Other (See Comments)    Facial rash   Tetanus Toxoids Rash and Other (See Comments)    Fever and hot to touch at injection site    Current Meds  Medication Sig   albuterol (VENTOLIN HFA) 108 (90 Base) MCG/ACT inhaler Inhale 2 puffs into the lungs every 6 (six) hours as needed for wheezing or shortness of breath.   amLODipine (NORVASC) 5 MG tablet Take 1 tablet (5 mg total) by mouth daily. (Patient taking differently: Take 5 mg by mouth in the morning and at bedtime.)   apixaban (ELIQUIS) 5 MG TABS tablet Take 1 tablet (5 mg total) by mouth 2 (two) times daily.   busPIRone (BUSPAR) 5 MG tablet Take 1 tablet (5  mg total) by mouth 2 (two) times daily.   cetirizine (ZYRTEC) 10 MG tablet TAKE 1 TABLET BY MOUTH EVERYDAY AT BEDTIME   dicyclomine (BENTYL) 10 MG capsule TAKE 1 CAPSULE (10 MG TOTAL) BY MOUTH 2 (TWO) TIMES DAILY AS NEEDED FOR SPASMS.   ENBREL SURECLICK 50 MG/ML injection Inject 50 mg into the skin once a week. Takes on Fridays   escitalopram (LEXAPRO) 10 MG tablet Take 1 tablet (10 mg total) by mouth daily.   famotidine (PEPCID) 20 MG tablet TAKE 1 TABLET BY MOUTH EVERYDAY AT BEDTIME   Fluticasone-Umeclidin-Vilant (TRELEGY ELLIPTA) 100-62.5-25 MCG/ACT AEPB Inhale 1 puff into the lungs daily.   gabapentin (NEURONTIN) 300 MG capsule Take 300 mg in the morning and 600 mg at night.   ipratropium-albuterol (DUONEB) 0.5-2.5 (3) MG/3ML SOLN    lamoTRIgine (LAMICTAL) 100 MG tablet TAKE 1 TAB 2 TIMES DAILY. FURTHER REFILLS NEW PSYCH FOR ALL PSYCH  MEDS ONLY TEMP SUPPLY FROM PCP   leflunomide (ARAVA) 20 MG tablet Take 1 tablet (20 mg total) by mouth daily.   lidocaine (LIDODERM) 5 % Place 1 patch onto the skin 2 (two) times daily as needed. Remove & Discard patch within 12 hours or as directed by MD   lovastatin (MEVACOR) 20 MG tablet Take 1 tablet (20 mg total) by mouth daily at 6 PM.   methocarbamol (ROBAXIN) 500 MG tablet Take 500 mg by mouth 4 (four) times daily as needed for muscle spasms. Taking twice a day currently   mometasone (NASONEX) 50 MCG/ACT nasal spray USE 1 SPRAY TO EACH NOSTRIL TWICE A DAY   montelukast (SINGULAIR) 10 MG tablet TAKE 1 TABLET BY MOUTH EVERY DAY   multivitamin-lutein (OCUVITE-LUTEIN) CAPS capsule Take 1 capsule by mouth at bedtime.   nebivolol (BYSTOLIC) 5 MG tablet Take 1 tablet (5 mg total) by mouth in the morning and at bedtime. (Note dose changed from 1/2 10 mg bid to 5 mg bid)   ondansetron (ZOFRAN) 4 MG tablet Take 1 tablet (4 mg total) by mouth every 6 (six) hours as needed for nausea.   OXYGEN Inhale 3 L into the lungs continuous.   pantoprazole (PROTONIX) 40 MG tablet TAKE 1 TABLET BY MOUTH 2 TIMES DAILY 30 MIN BEFORE FOOD (NOTE REDUCTION IN FREQUENCY)   predniSONE (DELTASONE) 5 MG tablet TAKE 1 TABLET BY MOUTH EVERY DAY WITH BREAKFAST   PROLIA 60 MG/ML SOSY injection Inject 60 mg into the skin every 6 (six) months.   QUEtiapine (SEROQUEL) 25 MG tablet TAKE 1 TABLET (25 MG TOTAL) BY MOUTH AT BEDTIME. AGAIN LAST FILL FURTHER REFILLS FROM PSYCHIATRY NO EXCEPTIONS   sucralfate (CARAFATE) 1 g tablet TAKE 1 TABLET BY MOUTH TWICE A DAY   traMADol (ULTRAM) 50 MG tablet Take 1 tablet (50 mg total) by mouth every 12 (twelve) hours as needed.   Vibegron (GEMTESA) 75 MG TABS Take 75 mg by mouth daily.    Immunization History  Administered Date(s) Administered   Fluad Quad(high Dose 65+) 10/03/2018, 10/16/2019, 11/22/2020, 10/21/2021   Influenza Split 10/04/2012, 10/25/2013, 10/28/2016   Influenza Whole  11/03/2011   Influenza, High Dose Seasonal PF 10/04/2015, 11/03/2016   Influenza,inj,Quad PF,6+ Mos 10/31/2014   Influenza-Unspecified 01/02/2010, 11/24/2011, 10/21/2013, 10/31/2014, 09/28/2017   PPD Test 07/17/2013, 11/05/2020, 04/09/2022   Pneumococcal Conjugate-13 10/21/2013   Pneumococcal Polysaccharide-23 11/07/2015   Respiratory Syncytial Virus Vaccine,Recomb Aduvanted(Arexvy) 01/12/2022        Objective:     BP 120/80 (BP Location: Right Arm, Cuff Size: Normal)  Pulse 82   Temp 97.8 F (36.6 C)   Ht 4' 11.5" (1.511 m)   Wt 175 lb (79.4 kg) Comment: per the patient. in a wheelchair today  SpO2 97%   BMI 34.75 kg/m   SpO2: 97 % O2 Device: Nasal cannula O2 Flow Rate (L/min): 3 L/min O2 Type: Pulse O2  GENERAL: Well-developed, well-nourished elderly woman, she presents in a transport chairHEAD: Normocephalic, atraumatic. EYES: Pupils equal, round, reactive to light.  No scleral icterus. MOUTH: Oral mucosa moist.  No thrush. NECK: Supple. No thyromegaly. Trachea midline. No JVD.  No adenopathy. PULMONARY: Good air entry bilaterally.  Coarse breath sounds throughout, otherwise no adventitious sounds.  CARDIOVASCULAR: S1 and S2. Regular rate and rhythm.  Grade 3/6 systolic ejection murmur left sternal border, seems more prominent than on prior exam. GASTROINTESTINAL: Benign MUSCULOSKELETAL: Moderate changes of RA both hands, no clubbing, +1 edema LE's.  Limited range of motion of joints due to RA. NEUROLOGIC: No overt focal deficits.  Awake, alert, speech is fluent. SKIN: Intact,warm,dry. PSYCH: Mood and behavior appropriate   Assessment & Plan:     ICD-10-CM   1. Asthma-COPD overlap syndrome (HCC)  J44.89     2. Chronic respiratory failure with hypoxia (HCC)  J96.11     3. Mitral and aortic valve regurgitation  I08.0     4. NSIP (nonspecific interstitial pneumonitis) (HCC)  J84.89     5. Rheumatoid arthritis involving multiple sites with positive rheumatoid  factor (HCC)  M05.79      Orders Placed This Encounter  Procedures   Flu Vaccine Trivalent High Dose (Fluad)   Meds ordered this encounter  Medications   albuterol (PROVENTIL) (2.5 MG/3ML) 0.083% nebulizer solution    Sig: Take 3 mLs (2.5 mg total) by nebulization every 6 (six) hours as needed for wheezing or shortness of breath.    Dispense:  75 mL    Refill:  12   Assessment and Plan    Cardiac Issues New onset shortness of breath and leg swelling suggestive of possible cardiac decompensation. History of aortic stenosis and mitral valve leakage. Auscultation revealed louder murmur than previous. -Advise patient to move up cardiology appointment with Dr. Beryle Beams at [facility] for echocardiogram. [facility] cardiologist of changes in patient's condition.  COPD Patient reports improvement with Trelegy, no need for nebulizer or rescue inhaler. However, reports cough and shortness of breath with exertion and when lying down. -Continue Trelegy. -Discontinue DuoNeb nebulizer solution. -Albuterol as rescue. -Initiate Ohtuvaire nebulizer solution twice daily, pending approval.  General Health Maintenance -Administer high-dose influenza vaccine today. -Plan to administer lifelong pneumonia vaccine at next visit.  Follow-up in 6-8 weeks to assess response to new nebulizer medication and cardiac status.      Gailen Shelter, MD Advanced Bronchoscopy PCCM [facility]    *This note was dictated using voice recognition software/Dragon.  Despite best efforts to proofread, errors can occur which can change the meaning. Any transcriptional errors that result from this process are unintentional and may not be fully corrected at the time of dictation.

## 2022-11-20 ENCOUNTER — Telehealth: Payer: Self-pay

## 2022-11-20 NOTE — Telephone Encounter (Signed)
Per Dr. Georgann Housekeeper last note. She wants to start the patient on Ohtuvayre.   Patient will need to sign the [facility] form so we can fax it in. I have left the patient a message to contact our office.

## 2022-11-23 ENCOUNTER — Telehealth: Payer: Self-pay | Admitting: Urology

## 2022-11-23 DIAGNOSIS — J441 Chronic obstructive pulmonary disease with (acute) exacerbation: Secondary | ICD-10-CM | POA: Diagnosis not present

## 2022-11-23 NOTE — Telephone Encounter (Signed)
Spoke with patient. Patient states she spoke with a representative from [place] company and she was advised they could try to see if she would qualify for free medicine supply. They would need RX faxed to [phone] and on top of RX write TAP Request.

## 2022-11-23 NOTE — Telephone Encounter (Signed)
Pt is trying to get assistance with her [employer].  She would need it sent to the following:  TAP Request Fax# [phone]

## 2022-11-23 NOTE — Telephone Encounter (Signed)
Let PT know what Dr. Reece Agar said. She asked that we mail the form to her and highlight what she needs to sign for this medication. Please send her a self addressed/stampmed envelope if poss because it's hard for her to get a stamp.

## 2022-11-25 NOTE — Telephone Encounter (Signed)
Letter has been mailed to the patient.  Holding message to ensure completion.

## 2022-11-26 ENCOUNTER — Ambulatory Visit: Payer: Medicare PPO | Admitting: Family Medicine

## 2022-11-27 ENCOUNTER — Other Ambulatory Visit: Payer: Self-pay

## 2022-11-27 MED ORDER — GEMTESA 75 MG PO TABS: 75.0000 mg | ORAL_TABLET | Freq: Every day | ORAL | 3 refills | Status: DC

## 2022-11-27 NOTE — Telephone Encounter (Signed)
Rx faxed

## 2022-12-01 ENCOUNTER — Telehealth: Payer: Self-pay | Admitting: Pulmonary Disease

## 2022-12-01 MED ORDER — TRELEGY ELLIPTA 100-62.5-25 MCG/ACT IN AEPB: 1.0000 | INHALATION_SPRAY | Freq: Every day | RESPIRATORY_TRACT | 3 refills | Status: DC

## 2022-12-01 NOTE — Telephone Encounter (Signed)
As of 10/29 the patient had not received the form. I did tell her it was sent to her Po Box and she said she would have someone go check it.  Holding message to ensure completion.

## 2022-12-01 NOTE — Telephone Encounter (Signed)
Pt states she is trying to get her Trelegy for free. Needs a RX to send in with paperwork

## 2022-12-01 NOTE — Telephone Encounter (Signed)
I spoke with the patient. She said she has a form for Trelegy and Eliquis so she needs the Trelegy script mailed to her so she can mail them in together. I have printed a prescription for the Trelegy. It was signed by Dr. Jayme Cloud and mailed to the patient.  Nothing further needed.

## 2022-12-07 DIAGNOSIS — D3131 Benign neoplasm of right choroid: Secondary | ICD-10-CM | POA: Diagnosis not present

## 2022-12-07 DIAGNOSIS — H353211 Exudative age-related macular degeneration, right eye, with active choroidal neovascularization: Secondary | ICD-10-CM | POA: Diagnosis not present

## 2022-12-07 DIAGNOSIS — H04123 Dry eye syndrome of bilateral lacrimal glands: Secondary | ICD-10-CM | POA: Diagnosis not present

## 2022-12-07 DIAGNOSIS — H353122 Nonexudative age-related macular degeneration, left eye, intermediate dry stage: Secondary | ICD-10-CM | POA: Diagnosis not present

## 2022-12-07 DIAGNOSIS — H43813 Vitreous degeneration, bilateral: Secondary | ICD-10-CM | POA: Diagnosis not present

## 2022-12-11 NOTE — Telephone Encounter (Signed)
Patient returned her Ohtuvayre form. I have faxed it to the Pharmacy Team for completion.

## 2022-12-14 ENCOUNTER — Telehealth: Payer: Self-pay | Admitting: Pharmacist

## 2022-12-14 ENCOUNTER — Other Ambulatory Visit: Payer: Self-pay | Admitting: Pharmacist

## 2022-12-14 NOTE — Telephone Encounter (Signed)
Received referral form for [facility] from [place] office. Faxed to [employer] with clinicals and insurance card copy  Fax: [phone] Phone: [phone]  Chesley Mires, PharmD, MPH, BCPS, CPP Clinical Pharmacist (Rheumatology and Pulmonology)

## 2022-12-14 NOTE — Telephone Encounter (Signed)
Pharmacy team has received the forms and created another encounter for them to follow up on the medication.  Nothing further needed.

## 2022-12-17 NOTE — Telephone Encounter (Signed)
Received fax from [employer] stating patient has been enrolled into program for [facility].  However they spelled patient's name as EYLA instead of Kay.  Patient ID: [id number]  Rx has been triaged to [facility]. Rep will fax updated rx with correct name. She updated patient's name to have correct spelling  Chesley Mires, PharmD, MPH, BCPS, CPP Clinical Pharmacist (Rheumatology and Pulmonology)

## 2022-12-21 ENCOUNTER — Telehealth: Payer: Self-pay

## 2022-12-21 NOTE — Telephone Encounter (Signed)
Left message to return call to our office.  Received paperwork fr Genetic testing. Called pt to see if she had asked for it or was agreeable to do it.

## 2022-12-21 NOTE — Telephone Encounter (Signed)
Pt called back and I read the note and she stated to disregard it. Clent Ridges did not approve it

## 2022-12-22 NOTE — Telephone Encounter (Signed)
Patient is calling for an update on her medication. Please call once her medication has been approved [phone]

## 2022-12-24 ENCOUNTER — Ambulatory Visit: Payer: Medicare PPO | Admitting: Podiatry

## 2022-12-24 DIAGNOSIS — J441 Chronic obstructive pulmonary disease with (acute) exacerbation: Secondary | ICD-10-CM | POA: Diagnosis not present

## 2022-12-24 NOTE — Telephone Encounter (Signed)
Key ID : [id number]  This code is for Cover My meds. "It's been initiated and we just need to send it to the plan. "   [facility] states you will know what this means.

## 2022-12-24 NOTE — Telephone Encounter (Signed)
Rx was sent to [facility] as of 12/16/22. Patient can reached out to pharmacy to schedule shipment. ATC patient to discuss. Left VM with pharmacy's phone number  Phone: [phone]  Chesley Mires, PharmD, MPH, BCPS, CPP Clinical Pharmacist (Rheumatology and Pulmonology)

## 2022-12-25 NOTE — Telephone Encounter (Signed)
[employer] PA for [facility] completed on [facility]  Key: [id number]

## 2022-12-28 DIAGNOSIS — M5416 Radiculopathy, lumbar region: Secondary | ICD-10-CM | POA: Diagnosis not present

## 2022-12-28 DIAGNOSIS — M7918 Myalgia, other site: Secondary | ICD-10-CM | POA: Diagnosis not present

## 2022-12-28 DIAGNOSIS — S42121D Displaced fracture of acromial process, right shoulder, subsequent encounter for fracture with routine healing: Secondary | ICD-10-CM | POA: Diagnosis not present

## 2022-12-28 DIAGNOSIS — Z96612 Presence of left artificial shoulder joint: Secondary | ICD-10-CM | POA: Diagnosis not present

## 2022-12-28 DIAGNOSIS — S76011A Strain of muscle, fascia and tendon of right hip, initial encounter: Secondary | ICD-10-CM | POA: Diagnosis not present

## 2022-12-28 DIAGNOSIS — S46912A Strain of unspecified muscle, fascia and tendon at shoulder and upper arm level, left arm, initial encounter: Secondary | ICD-10-CM | POA: Diagnosis not present

## 2022-12-28 DIAGNOSIS — Z96641 Presence of right artificial hip joint: Secondary | ICD-10-CM | POA: Diagnosis not present

## 2022-12-28 NOTE — Telephone Encounter (Signed)
Paperwork put into shred bin.

## 2022-12-31 ENCOUNTER — Other Ambulatory Visit: Payer: Self-pay | Admitting: Pulmonary Disease

## 2023-01-04 ENCOUNTER — Encounter: Payer: Self-pay | Admitting: Physician Assistant

## 2023-01-04 ENCOUNTER — Ambulatory Visit: Payer: Medicare PPO | Admitting: Physician Assistant

## 2023-01-04 DIAGNOSIS — M810 Age-related osteoporosis without current pathological fracture: Secondary | ICD-10-CM | POA: Diagnosis not present

## 2023-01-04 DIAGNOSIS — N3941 Urge incontinence: Secondary | ICD-10-CM | POA: Diagnosis not present

## 2023-01-04 DIAGNOSIS — N289 Disorder of kidney and ureter, unspecified: Secondary | ICD-10-CM

## 2023-01-04 LAB — BLADDER SCAN AMB NON-IMAGING: Scan Result: 0

## 2023-01-04 NOTE — Telephone Encounter (Signed)
Okay to refill? 

## 2023-01-04 NOTE — Patient Instructions (Signed)
Please let me know if we need to re-send the [facility] prescription in and if you want to do PTNS treatments for your bladder.

## 2023-01-04 NOTE — Progress Notes (Signed)
01/04/2023 1:58 PM   Kerri Carter September 17, 1940 [id number]  CC: Chief Complaint  Patient presents with   Urinary Incontinence   HPI: Kerri Carter is a 82 y.o. female with PMH solitary left kidney, possible left renal mass, and OAB wet on Gemtesa who presents today for annual follow-up.   Today she reports reasonable symptom control on Gemtesa, however she continues to have frequent enough high-volume leaks that she is embarrassed to leave her home.  She will also have incontinence of stool, but this is limited to when she is having diarrhea episodes, approximately every 3 to 4 months.  Overall she has still not met her treatment goal on Gemtesa.  We faxed her Gemtesa prescription to the drug manufacture to see if she would qualify for free medication, but she has not received an update from them about this.  She plans to call them later today.  She underwent CTAP without contrast on 08/10/2022, which was notable for stable appearing subcentimeter low-density left renal lesions, which remain too small to characterize.  PVR 0 mL.  PMH: Past Medical History:  Diagnosis Date   Acromial process of scapula fracture 12/21/2019   Acute kidney injury superimposed on chronic kidney disease (HCC) 05/26/2021   Acute on chronic combined systolic (congestive) and diastolic (congestive) heart failure (HCC) 07/28/2021   Acute on chronic combined systolic and diastolic CHF (congestive heart failure) (HCC) 05/26/2021   Acute on chronic respiratory failure with hypoxia (HCC) 09/10/2021   Acute pain of left knee 12/18/2020   Anemia in stage 3a chronic kidney disease (HCC) 10/09/2021   Anxiety    Anxiety 01/20/2019   Anxiety and depression 05/26/2021   Aortic stenosis 07/09/2015   a.) TTE 07/06/2015: EF 55-60%; mild AS with MPG 13.0 mmHg.   Arrhythmia    atrial fibrillation   Assistance needed for mobility 12/18/2020   Asthma    At high risk for falls 03/16/2019   Avascular necrosis of left  shoulder due to adverse effect of steroid therapy (HCC) 01/20/2018   Bipolar disorder (HCC)    C. difficile diarrhea 2010   a.) following ABX course during hospital admission   Carotid atherosclerosis, bilateral    a.) Moderate; < 50% stenosis BILATERAL ICAs.   Cataract    a.) s/p BILATERAL extraction   CHF (congestive heart failure) (HCC)    Chicken pox    Chronic left shoulder pain 08/29/2019   Chronic respiratory failure with hypoxia (HCC) 10/01/2021   CKD (chronic kidney disease), stage III (HCC)    a. s/p R nephrectomy./ aneurysm   Closed fracture of acromial process of scapula 10/28/2018   Closed nondisplaced fracture of proximal phalanx of lesser toe of right foot    Collapsed vertebra, not elsewhere classified, lumbar region, initial encounter for fracture (HCC) 02/21/2019   Community acquired pneumonia 07/22/2021   Complicated grief 03/16/2019   Compression fracture of L4 vertebra (HCC) 02/21/2019   Conversion disorder    Conversion disorder 08/04/2015   COPD (chronic obstructive pulmonary disease) (HCC)    COPD with exacerbation (HCC) 08/20/2021   Cough 05/02/2012   Depression    Dislocation of hip joint prosthesis (HCC) 01/08/2017   Elevated brain natriuretic peptide (BNP) level 12/18/2020   Elevated d-dimer 05/26/2021   Episodic mood disorder (HCC) 04/18/2019   Essential hypertension    Fatigue 07/07/2018   Frequent falls 12/18/2020   GERD (gastroesophageal reflux disease)    H/O cardiac catheterization 02/24/2011   Formatting of this note might be  different from the original.  Repeated at [facility] 2/13 and insignificant disease   Hav (hallux abducto valgus), unspecified laterality 12/05/2018   Heart murmur    Hip fracture (HCC) 10/31/2016   Hip pain 12/18/2020   History of fracture of left hip 01/15/2017   History of syncope 08/25/2016   Hyperkalemia 05/26/2021   - The patient was given p.o. Lokelma and IV Lasix.  - We will follow potassium level.    Hyperlipidemia    Hypokalemia 08/25/2016   ILD (interstitial lung disease) (HCC)    mild; 2/2 RA diagnosis   Iliopsoas bursitis of right hip 08/29/2019   Inflammatory arthritis    a. hands/carpal tunnel.  b. Low titer rheumatoid factor. c. Negative anti-CCP antibodies. d. Plaquenil.   Insomnia 07/17/2015   Intractable nausea and vomiting 08/10/2022   Left foot pain 10/19/2016   Left lower lobe pneumonia 05/26/2021   Leg edema 05/02/2019   Leg pain, bilateral    Leg swelling 12/15/2016   Leukocytosis 10/19/2017   Leukopenia 06/24/2021   Lymphocytosis 07/13/2021   Macular degeneration    Microcytic anemia 08/15/2015   Mixed bipolar I disorder (HCC) 10/09/2015   Nocturnal hypoxemia    Nocturnal hypoxemia 09/09/2011   June 2013 overnight oximetry on 6 centimeters of water CPAP: SpO2 less than 88% 72% of the time   Non-Obstructive CAD    a. 07/2009 Cath ([facility]): nonobs dzs;  b. 03/2011 Cath [facility]): nonobs dzs.   NSIP (nonspecific interstitial pneumonitis) (HCC) 12/08/2019   Osteoarthritis    a. Knees.   Other shoulder lesions, left shoulder 12/20/2017   Other specified postprocedural states 07/20/2013   Overweight (BMI 25.0-29.9) 08/29/2019   PAD (peripheral artery disease) (HCC)    Pain due to onychomycosis of toenails of both feet 12/05/2018   Pelvic mass in female 03/16/2019   02/21/19 CT pelvis w/o contrast  Cystic left adnexal lesion has enlarged since 2018 where it  measured approximately 1.8 x 1.7 cm. This is not well assessed due  to streak artifact as well as lack of contrast. Ultrasound follow-up  may be helpful in 8-12 weeks      04/05/19 TVUS     IMPRESSION:  Simple appearing cystic structure within the left ovary measuring up  to 2.9 cm. While this has enlarged s   Physical deconditioning 02/27/2021   Pleural effusion on left 12/18/2020   PUD (peptic ulcer disease)    Recurrent falls 09/01/2021   Recurrent pneumonia 07/23/2021   Repeated falls 12/18/2020   Respiratory failure  with hypoxia (HCC) 01/12/2021   Rotator cuff tendinitis, right 07/26/2017   S/P lumbar fusion 05/23/2021   S/P right hip fracture    11/01/16 s/p repair   Sepsis due to pneumonia (HCC) 07/22/2021   Shoulder pain    Sleep apnea    no cpap / minimal   SOB (shortness of breath) 10/13/2011   2011 [facility] eval >> LHC (non-obstructive CAD), RHC (wedge 13, mean PA 26), TTE (LVEF > 55%, mild concentric LVH, mild AR, MR, PR, TR), CTAngio chest unrevealing; "well controlled asthma"  02/2011 TTE [facility]>> NL LVEF, Mild LVH, Mod AR/Mod MR   Spinal stenosis at L4-L5 level    severe with L4/L5 anterolisthesis grade 1 anterolisthesis    Spinal stenosis of lumbar region 09/15/2016   Last Assessment & Plan:   Now post op with continued back and leg pain noted. Is working with pt/ot and surgery was said to have no complications. Bowels are moving  and general pain and anxiety is basically baseline.    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   Weakness of both lower extremities 12/18/2020    Surgical History: Past Surgical History:  Procedure Laterality Date   APPENDECTOMY     APPLICATION OF INTRAOPERATIVE CT SCAN N/A 05/23/2021   Procedure: APPLICATION OF INTRAOPERATIVE CT SCAN;  Surgeon: Venetia Night, MD;  Location: [facility];  Service: Neurosurgery;  Laterality: N/A;   BACK SURGERY     lumbar fusion   BUNIONECTOMY Right    CATARACT EXTRACTION, BILATERAL      CESAREAN SECTION     x1   CHOLECYSTECTOMY N/A 05/11/2016   Procedure: LAPAROSCOPIC CHOLECYSTECTOMY;  Surgeon: Lattie Haw, MD;  Location: [facility];  Service: General;  Laterality: N/A;   COLONOSCOPY WITH PROPOFOL N/A 04/02/2016   Procedure: COLONOSCOPY WITH PROPOFOL;  Surgeon: Wyline Mood, MD;  Location: [facility];  Service: Endoscopy;  Laterality: N/A;   ENDOSCOPIC RETROGRADE CHOLANGIOPANCREATOGRAPHY (ERCP) WITH PROPOFOL N/A 05/08/2016   Procedure: ENDOSCOPIC RETROGRADE CHOLANGIOPANCREATOGRAPHY (ERCP) WITH PROPOFOL;  Surgeon: Midge Minium, MD;  Location: [facility];  Service: Endoscopy;  Laterality: N/A;   ERCP     with biliary spincterotomy 05/08/16 Dr. Servando Snare for choledocholithiasis    ESOPHAGEAL DILATION  04/02/2016   Procedure: ESOPHAGEAL DILATION;  Surgeon: Wyline Mood, MD;  Location: [facility];  Service: Endoscopy;;   ESOPHAGOGASTRODUODENOSCOPY (EGD) WITH PROPOFOL N/A 04/02/2016   Procedure: ESOPHAGOGASTRODUODENOSCOPY (EGD) WITH PROPOFOL;  Surgeon: Wyline Mood, MD;  Location: [facility];  Service: Endoscopy;  Laterality: N/A;   HIP ARTHROPLASTY Right 11/01/2016   Procedure: ARTHROPLASTY BIPOLAR HIP (HEMIARTHROPLASTY);  Surgeon: Christena Flake, MD;  Location: [facility];  Service: Orthopedics;  Laterality: Right;   LUMBAR LAMINECTOMY/DECOMPRESSION MICRODISCECTOMY N/A 12/11/2020   Procedure: LEFT L2-3 MICRODISCECTOMY, L3-4 AND L5-S1 DECOMPRESSION;  Surgeon: Venetia Night, MD;  Location: [facility];  Service: Neurosurgery;  Laterality: N/A;   NEPHRECTOMY  1988   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   ORIF SCAPHOID FRACTURE Left 12/21/2019   Procedure: OPEN REDUCTION INTERNAL FIXATION (ORIF) OF LEFT SCAPULAR NONUNION WITH BONE GRAFT;  Surgeon: Christena Flake, MD;  Location: [facility];  Service: Orthopedics;  Laterality: Left;   osteoporosis     noted DEXA 08/19/16    REPLACEMENT TOTAL KNEE Right    REVERSE SHOULDER ARTHROPLASTY Right 11/04/2017   Procedure:  REVERSE SHOULDER ARTHROPLASTY;  Surgeon: Christena Flake, MD;  Location: [facility];  Service: Orthopedics;  Laterality: Right;   REVERSE SHOULDER ARTHROPLASTY Left 07/26/2018   Procedure: REVERSE SHOULDER ARTHROPLASTY;  Surgeon: Christena Flake, MD;  Location: [facility];  Service: Orthopedics;  Laterality: Left;   TONSILLECTOMY     TOTAL HIP ARTHROPLASTY  12/10/11   [facility] left hip   TOTAL HIP ARTHROPLASTY Bilateral    TRANSFORAMINAL LUMBAR INTERBODY FUSION (TLIF) WITH PEDICLE SCREW FIXATION 3 LEVEL N/A 05/23/2021   Procedure: OPEN L2-4 TRANSFORAMINAL LUMBAR INTERBODY FUSION (TLIF) WITH L2-5 POSTERIOR SPINAL FUSION;  Surgeon: Venetia Night, MD;  Location: [facility];  Service: Neurosurgery;  Laterality: N/A;   TUBAL LIGATION      Home Medications:  Allergies as of 01/04/2023       Reactions   Ceftin [cefuroxime Axetil] Anaphylaxis   Lisinopril Anaphylaxis   Sulfa Antibiotics Other (See Comments)   Face swelling   Sulfasalazine Anaphylaxis   Morphine Other (See Comments)   Per patient, low blood pressure issues that requires action to raise it back up. Can take small infrequent doses   Xarelto [rivaroxaban] Other (See Comments)   Stomach burning, bleeding, and tar in stool   Adhesive [tape] Rash   Paper tape and tega derm OK   Antihistamines, Chlorpheniramine-type Other (See Comments)   Makes pt hyper   Antivert [meclizine Hcl] Other (See Comments)   Bladder will not empty   Aspirin Other (See Comments)   Sulfasalazine allergy cross reacts   Contrast Media [iodinated Contrast Media] Rash   she is able to use betadine scrubs.   Decongestant [pseudoephedrine Hcl] Other (See Comments)   Makes pt hyper   Doxycycline Other (See Comments)   GI upset   Levaquin [levofloxacin In D5w] Rash   Polymyxin B Other (See Comments)   Facial rash   Tetanus Toxoids Rash, Other (See Comments)   Fever and hot to touch at injection site        Medication List        Accurate as of January 04, 2023  1:58 PM. If you have any questions, ask your nurse or doctor.          albuterol 108 (90 Base) MCG/ACT inhaler Commonly known as: Ventolin HFA Inhale 2 puffs into the lungs every 6 (six) hours as needed for wheezing or shortness of breath.   albuterol (2.5 MG/3ML) 0.083% nebulizer solution Commonly known as: PROVENTIL Take 3 mLs (2.5 mg total) by nebulization every 6 (six) hours as needed for wheezing or shortness of breath.   amLODipine 5 MG tablet Commonly known as: NORVASC Take 1 tablet (5 mg total) by mouth daily. What changed: when to take this   apixaban 5 MG Tabs tablet Commonly known as: ELIQUIS Take 1 tablet (5 mg total) by mouth 2 (two) times daily.   busPIRone 5 MG tablet Commonly known as: BUSPAR Take 1 tablet (5 mg total) by mouth 2 (two) times daily.   cetirizine 10 MG tablet Commonly known as: ZYRTEC TAKE 1 TABLET BY MOUTH EVERYDAY AT BEDTIME   dicyclomine 10 MG capsule Commonly known as: BENTYL TAKE 1 CAPSULE (10 MG TOTAL) BY MOUTH 2 (TWO) TIMES DAILY AS NEEDED FOR SPASMS.   Enbrel SureClick 50 MG/ML injection Generic drug: etanercept Inject 50 mg into the skin once a week. Takes on Fridays   escitalopram 10 MG tablet Commonly known as: LEXAPRO Take 1 tablet (10 mg total) by mouth daily.   famotidine 20 MG tablet Commonly known as: PEPCID TAKE 1 TABLET BY MOUTH EVERYDAY AT BEDTIME   gabapentin 300 MG capsule Commonly known as: NEURONTIN Take 300 mg in the morning and 600 mg at night.   Gemtesa 75 MG Tabs Generic drug: Vibegron Take 1 tablet (75 mg total) by mouth daily.   lamoTRIgine 100 MG tablet Commonly known as: LAMICTAL TAKE 1 TAB 2 TIMES DAILY. FURTHER REFILLS NEW PSYCH FOR ALL PSYCH MEDS ONLY TEMP SUPPLY FROM PCP   leflunomide 20 MG tablet Commonly known as: ARAVA Take 1 tablet (20 mg total) by mouth daily.  lidocaine 5 % Commonly known as: Lidoderm Place 1 patch onto the skin 2 (two) times daily as needed. Remove &  Discard patch within 12 hours or as directed by MD   lovastatin 20 MG tablet Commonly known as: MEVACOR Take 1 tablet (20 mg total) by mouth daily at 6 PM.   methocarbamol 500 MG tablet Commonly known as: ROBAXIN Take 500 mg by mouth 4 (four) times daily as needed for muscle spasms. Taking twice a day currently   mometasone 50 MCG/ACT nasal spray Commonly known as: NASONEX USE 1 SPRAY TO EACH NOSTRIL TWICE A DAY   montelukast 10 MG tablet Commonly known as: SINGULAIR TAKE 1 TABLET BY MOUTH EVERY DAY   multivitamin-lutein Caps capsule Take 1 capsule by mouth at bedtime.   nebivolol 5 MG tablet Commonly known as: Bystolic Take 1 tablet (5 mg total) by mouth in the morning and at bedtime. (Note dose changed from 1/2 10 mg bid to 5 mg bid)   ondansetron 4 MG tablet Commonly known as: ZOFRAN Take 1 tablet (4 mg total) by mouth every 6 (six) hours as needed for nausea.   OXYGEN Inhale 3 L into the lungs continuous.   pantoprazole 40 MG tablet Commonly known as: PROTONIX TAKE 1 TABLET BY MOUTH 2 TIMES DAILY 30 MIN BEFORE FOOD (NOTE REDUCTION IN FREQUENCY)   predniSONE 5 MG tablet Commonly known as: DELTASONE TAKE 1 TABLET BY MOUTH EVERY DAY WITH BREAKFAST   Prolia 60 MG/ML Sosy injection Generic drug: denosumab Inject 60 mg into the skin every 6 (six) months.   QUEtiapine 25 MG tablet Commonly known as: SEROQUEL TAKE 1 TABLET (25 MG TOTAL) BY MOUTH AT BEDTIME. AGAIN LAST FILL FURTHER REFILLS FROM PSYCHIATRY NO EXCEPTIONS   sucralfate 1 g tablet Commonly known as: CARAFATE TAKE 1 TABLET BY MOUTH TWICE A DAY   torsemide 20 MG tablet Commonly known as: DEMADEX Take 2 tablets (40 mg total) by mouth daily.   traMADol 50 MG tablet Commonly known as: ULTRAM Take 1 tablet (50 mg total) by mouth every 12 (twelve) hours as needed.   Trelegy Ellipta 100-62.5-25 MCG/ACT Aepb Generic drug: Fluticasone-Umeclidin-Vilant Inhale 1 puff into the lungs daily.         Allergies:  Allergies  Allergen Reactions   Ceftin [Cefuroxime Axetil] Anaphylaxis   Lisinopril Anaphylaxis   Sulfa Antibiotics Other (See Comments)    Face swelling   Sulfasalazine Anaphylaxis   Morphine Other (See Comments)    Per patient, low blood pressure issues that requires action to raise it back up. Can take small infrequent doses   Xarelto [Rivaroxaban] Other (See Comments)    Stomach burning, bleeding, and tar in stool   Adhesive [Tape] Rash    Paper tape and tega derm OK   Antihistamines, Chlorpheniramine-Type Other (See Comments)    Makes pt hyper   Antivert [Meclizine Hcl] Other (See Comments)    Bladder will not empty   Aspirin Other (See Comments)    Sulfasalazine allergy cross reacts   Contrast Media [Iodinated Contrast Media] Rash    she is able to use betadine scrubs.   Decongestant [Pseudoephedrine Hcl] Other (See Comments)    Makes pt hyper   Doxycycline Other (See Comments)    GI upset   Levaquin [Levofloxacin In D5w] Rash   Polymyxin B Other (See Comments)    Facial rash   Tetanus Toxoids Rash and Other (See Comments)    Fever and hot to touch at injection site  Family History: Family History  Problem Relation Age of Onset   Rheum arthritis Mother    Asthma Mother    Parkinson's disease Mother    Heart disease Mother    Stroke Mother    Hypertension Mother    Heart attack Father    Heart disease Father    Hypertension Father    Peripheral Artery Disease Father    Diabetes Son    Gout Son    Asthma Sister    Heart disease Sister    Lung cancer Sister    Heart disease Sister    Heart disease Sister    Breast cancer Sister    Heart attack Sister    Heart disease Brother    Heart disease Maternal Grandmother    Diabetes Maternal Grandmother    Colon cancer Maternal Grandmother    Cancer Maternal Grandmother        Hodgkins lymphoma   Heart disease Brother    Alcohol abuse Brother    Depression Brother    Dementia Son      Social History:   reports that she quit smoking about 48 years ago. Her smoking use included cigarettes. She started smoking about 68 years ago. She has a 10 pack-year smoking history. She has never used smokeless tobacco. She reports that she does not drink alcohol and does not use drugs.  Physical Exam: There were no vitals taken for this visit.  Constitutional:  Alert and oriented, no acute distress, nontoxic appearing HEENT: [place], AT Cardiovascular: No clubbing, cyanosis, or edema Respiratory: Normal respiratory effort, no increased work of breathing Skin: No rashes, bruises or suspicious lesions Neurologic: Grossly intact, no focal deficits, moving all 4 extremities Psychiatric: Normal mood and affect  Laboratory Data: Results for orders placed or performed in visit on 01/04/23  Bladder Scan (Post Void Residual) in office  Result Value Ref Range   Scan Result 0    *Note: Due to a large number of results and/or encounters for the requested time period, some results have not been displayed. A complete set of results can be found in Results Review.   Assessment & Plan:   1. Urge incontinence Kerri Carter is helping, but she is not yet at treatment goal and continues to experience high-volume leaks.  She is emptying appropriately.  We again discussed augmenting with PTNS and she would like to consider this.  I gave her a pamphlet today and she will call us back if she desires to schedule.  She will also pursue an update regarding her low cost Gemtesa later today and make [place] aware if anything additional is needed from [place]. - Bladder Scan (Post Void Residual) in office  2. Renal lesion Multiple subcentimeter left renal lesions remain too small to characterize on noncontrast CT.  Given diminutive size, will continue to defer contrast-enhanced imaging and continue to monitor.  Return in about 1 year (around 01/04/2024) for Annual OAB f/u with PVR.  Carman Ching, PA-C  [facility] [place] [address], [address] [place], [place] [address] [phone]

## 2023-01-04 NOTE — Telephone Encounter (Signed)
Per CMM response: This request was denied under your Medicare Part D benefit; however, coverage for the requested drug(s) has been approved under Medicare Part B. [employer] follows Medicare rules. The Medicare rule in Chapter 6 of the Prescription Drug Manual says that drugs covered under the Part B benefit cannot be covered under Part D. Your pharmacy tells [place] where you live when they submit pharmacy claims. Your pharmacy has indicated you are getting this medication at home. The Medicare Benefit Manual (Chapter 15, Section 110.3) says Medicare Part B pays for drugs that require administration by the use of a piece of covered durable medical equipment (DME) such as a nebulizer. [employer] has approved coverage for your drug under your Part B benefit for/through 02/02/2024. If you think Medicare Part D should cover this drug for you, you may appeal.

## 2023-01-04 NOTE — Telephone Encounter (Signed)
She is chronically on it.  OK to refill.

## 2023-01-04 NOTE — Telephone Encounter (Signed)
Pharmacy Patient Advocate Encounter  Received notification from  Medicare Medical Part B Benefit  that Prior Authorization for Ohtuvayre 3mg /2.63ml inhal sus has been APPROVED from 12/25/2022 to 02/02/2024   PA #/Case ID/Reference #: [id number]

## 2023-01-05 ENCOUNTER — Ambulatory Visit: Payer: Medicare PPO | Admitting: Pulmonary Disease

## 2023-01-05 ENCOUNTER — Telehealth: Payer: Self-pay | Admitting: Physician Assistant

## 2023-01-05 NOTE — Telephone Encounter (Signed)
Patient called and stated that RX request for Leslye Peer that was faxed on 11/27/22 was never received. She is requesting that we refax it to: [facility] at [phone]. Will need to write TAP request on top of rx.

## 2023-01-07 NOTE — Telephone Encounter (Signed)
Please refax from media tab

## 2023-01-08 ENCOUNTER — Other Ambulatory Visit: Payer: Self-pay | Admitting: Family Medicine

## 2023-01-08 ENCOUNTER — Telehealth: Payer: Self-pay | Admitting: Physician Assistant

## 2023-01-08 DIAGNOSIS — G8921 Chronic pain due to trauma: Secondary | ICD-10-CM

## 2023-01-08 NOTE — Telephone Encounter (Signed)
Pt called office to let us know she would like to proceed with PTNS.

## 2023-01-08 NOTE — Telephone Encounter (Signed)
RX re faxed and I called left message advising patient of this and to let us know if there are any issues with this still.

## 2023-01-08 NOTE — Telephone Encounter (Signed)
Left detailed message letting patient know I will work on PA but we are not able to get her scheduled until January and I will call insurance at that time for PA, also advised that she would have a co pay $40 each time for her visit x 12 weeks.  I asked patient to call us back if she changes her mind about this

## 2023-01-11 ENCOUNTER — Other Ambulatory Visit: Payer: Self-pay

## 2023-01-11 ENCOUNTER — Telehealth: Payer: Self-pay

## 2023-01-11 DIAGNOSIS — N1832 Chronic kidney disease, stage 3b: Secondary | ICD-10-CM | POA: Diagnosis not present

## 2023-01-11 DIAGNOSIS — G8921 Chronic pain due to trauma: Secondary | ICD-10-CM

## 2023-01-11 DIAGNOSIS — M810 Age-related osteoporosis without current pathological fracture: Secondary | ICD-10-CM | POA: Diagnosis not present

## 2023-01-11 MED ORDER — GABAPENTIN 300 MG PO CAPS: ORAL_CAPSULE | ORAL | 1 refills | Status: DC

## 2023-01-11 MED ORDER — METHOCARBAMOL 500 MG PO TABS: 500.0000 mg | ORAL_TABLET | Freq: Four times a day (QID) | ORAL | 0 refills | Status: AC | PRN

## 2023-01-11 NOTE — Telephone Encounter (Signed)
RX request sent to provider.

## 2023-01-11 NOTE — Telephone Encounter (Signed)
Prescription Request  01/11/2023  LOV: Visit date not found  What is the name of the medication or equipment? gabapentin (NEURONTIN) 300 MG capsule and methocarbamol (ROBAXIN) 500 MG tablet  Have you contacted your pharmacy to request a refill? Yes   Which pharmacy would you like this sent to?   [id number] - [place], [place] - [address] [place] [address] Phone: [phone] Fax: [phone]    Patient notified that their request is being sent to the clinical staff for review and that they should receive a response within 2 business days.   Please advise at Mobile [phone] (mobile)  Patient states she is out of these medications and has not taken any last night or today.  Patient states she would like for [place] to please call her and let her know when the refill is sent to her pharmacy.  Patient states another provider prescribed the Robaxin, so please let her know as soon as possible if we cannot refill it.

## 2023-01-18 NOTE — Telephone Encounter (Signed)
Zack from [facility] states [place] needs to be sent to [employer]. Phone number is [phone]. Zack phone number is [phone].

## 2023-01-23 DIAGNOSIS — J441 Chronic obstructive pulmonary disease with (acute) exacerbation: Secondary | ICD-10-CM | POA: Diagnosis not present

## 2023-02-04 NOTE — Telephone Encounter (Signed)
 PA submitted and pending on [employer] auth website. Tracking [id number]

## 2023-02-05 ENCOUNTER — Telehealth: Payer: Self-pay | Admitting: Physician Assistant

## 2023-02-05 NOTE — Telephone Encounter (Signed)
 Patient called and stated she is trying to get financial assistance for [facility]. She said she was told to have our office contact the financial assistance program at [phone] to begin the process.

## 2023-02-05 NOTE — Telephone Encounter (Signed)
 Approved Authorization #[id number]  Tracking [id number] 02/12/2023-06/02/2023

## 2023-02-05 NOTE — Telephone Encounter (Signed)
 Patient advised and scheduled.

## 2023-02-08 ENCOUNTER — Other Ambulatory Visit: Payer: Self-pay

## 2023-02-08 MED ORDER — GEMTESA 75 MG PO TABS: 75.0000 mg | ORAL_TABLET | Freq: Every day | ORAL | 3 refills | Status: DC

## 2023-02-08 NOTE — Telephone Encounter (Signed)
 Called [place] financial support and  She advised I [facility]. They will reach out to pt in 72 hours.   Pt aware.

## 2023-02-08 NOTE — Addendum Note (Signed)
 Addended by: Consuella Lose on: 02/08/2023 11:04 AM   Modules accepted: Orders

## 2023-02-10 ENCOUNTER — Telehealth: Payer: Self-pay | Admitting: Pulmonary Disease

## 2023-02-10 NOTE — Telephone Encounter (Signed)
 Patient requesting new mask for nebulizer. She reports that she did not get new mouth piece when [place]  was delivered.  Zack reports patient will need to contact [facility].  [facility]-spoke to [place], she reports they will ship out new mouth piece and tubing for patient. She reports patient should receive mouth piece with-in 2-3 days.   Patient advised that new mouth piece and tubing will be shipped out and that it will take more than a day to be delivered to her. I did offer for her to pick one up in our office. She reports that she can make it to our office. Nothing further needed.

## 2023-02-10 NOTE — Telephone Encounter (Signed)
 Pt states she needs a child's size nebulizer mask rx called in. She would like 2. She is not able to take her breathing treatments since she has no functioning mask

## 2023-02-11 ENCOUNTER — Other Ambulatory Visit: Payer: Self-pay

## 2023-02-11 ENCOUNTER — Emergency Department: Payer: Medicare PPO

## 2023-02-11 ENCOUNTER — Inpatient Hospital Stay
Admission: EM | Admit: 2023-02-11 | Discharge: 2023-02-16 | DRG: 191 | Disposition: A | Discharge status: Home Health | Payer: Medicare PPO | Attending: Internal Medicine | Admitting: Internal Medicine

## 2023-02-11 DIAGNOSIS — I739 Peripheral vascular disease, unspecified: Secondary | ICD-10-CM | POA: Diagnosis present

## 2023-02-11 DIAGNOSIS — H3530 Unspecified macular degeneration: Secondary | ICD-10-CM | POA: Diagnosis present

## 2023-02-11 DIAGNOSIS — E669 Obesity, unspecified: Secondary | ICD-10-CM | POA: Diagnosis present

## 2023-02-11 DIAGNOSIS — N1832 Chronic kidney disease, stage 3b: Secondary | ICD-10-CM

## 2023-02-11 DIAGNOSIS — Z886 Allergy status to analgesic agent status: Secondary | ICD-10-CM

## 2023-02-11 DIAGNOSIS — K219 Gastro-esophageal reflux disease without esophagitis: Secondary | ICD-10-CM | POA: Diagnosis present

## 2023-02-11 DIAGNOSIS — G4733 Obstructive sleep apnea (adult) (pediatric): Secondary | ICD-10-CM | POA: Diagnosis present

## 2023-02-11 DIAGNOSIS — I10 Essential (primary) hypertension: Secondary | ICD-10-CM | POA: Diagnosis not present

## 2023-02-11 DIAGNOSIS — E785 Hyperlipidemia, unspecified: Secondary | ICD-10-CM | POA: Diagnosis present

## 2023-02-11 DIAGNOSIS — Z9841 Cataract extraction status, right eye: Secondary | ICD-10-CM

## 2023-02-11 DIAGNOSIS — Z981 Arthrodesis status: Secondary | ICD-10-CM

## 2023-02-11 DIAGNOSIS — I482 Chronic atrial fibrillation, unspecified: Secondary | ICD-10-CM | POA: Diagnosis present

## 2023-02-11 DIAGNOSIS — Z79899 Other long term (current) drug therapy: Secondary | ICD-10-CM

## 2023-02-11 DIAGNOSIS — Z6834 Body mass index (BMI) 34.0-34.9, adult: Secondary | ICD-10-CM

## 2023-02-11 DIAGNOSIS — M810 Age-related osteoporosis without current pathological fracture: Secondary | ICD-10-CM | POA: Diagnosis present

## 2023-02-11 DIAGNOSIS — R0902 Hypoxemia: Secondary | ICD-10-CM | POA: Diagnosis not present

## 2023-02-11 DIAGNOSIS — Z66 Do not resuscitate: Secondary | ICD-10-CM | POA: Diagnosis present

## 2023-02-11 DIAGNOSIS — J849 Interstitial pulmonary disease, unspecified: Secondary | ICD-10-CM

## 2023-02-11 DIAGNOSIS — I2510 Atherosclerotic heart disease of native coronary artery without angina pectoris: Secondary | ICD-10-CM | POA: Diagnosis present

## 2023-02-11 DIAGNOSIS — M48061 Spinal stenosis, lumbar region without neurogenic claudication: Secondary | ICD-10-CM | POA: Diagnosis present

## 2023-02-11 DIAGNOSIS — Z96643 Presence of artificial hip joint, bilateral: Secondary | ICD-10-CM | POA: Diagnosis present

## 2023-02-11 DIAGNOSIS — Z96612 Presence of left artificial shoulder joint: Secondary | ICD-10-CM | POA: Diagnosis not present

## 2023-02-11 DIAGNOSIS — F418 Other specified anxiety disorders: Secondary | ICD-10-CM

## 2023-02-11 DIAGNOSIS — J441 Chronic obstructive pulmonary disease with (acute) exacerbation: Principal | ICD-10-CM

## 2023-02-11 DIAGNOSIS — M069 Rheumatoid arthritis, unspecified: Secondary | ICD-10-CM | POA: Diagnosis present

## 2023-02-11 DIAGNOSIS — F419 Anxiety disorder, unspecified: Secondary | ICD-10-CM | POA: Diagnosis present

## 2023-02-11 DIAGNOSIS — Z1152 Encounter for screening for COVID-19: Secondary | ICD-10-CM | POA: Diagnosis not present

## 2023-02-11 DIAGNOSIS — F3160 Bipolar disorder, current episode mixed, unspecified: Secondary | ICD-10-CM | POA: Diagnosis present

## 2023-02-11 DIAGNOSIS — I130 Hypertensive heart and chronic kidney disease with heart failure and stage 1 through stage 4 chronic kidney disease, or unspecified chronic kidney disease: Secondary | ICD-10-CM | POA: Diagnosis present

## 2023-02-11 DIAGNOSIS — Z9049 Acquired absence of other specified parts of digestive tract: Secondary | ICD-10-CM

## 2023-02-11 DIAGNOSIS — Z7952 Long term (current) use of systemic steroids: Secondary | ICD-10-CM

## 2023-02-11 DIAGNOSIS — I5042 Chronic combined systolic (congestive) and diastolic (congestive) heart failure: Secondary | ICD-10-CM | POA: Diagnosis present

## 2023-02-11 DIAGNOSIS — Z9851 Tubal ligation status: Secondary | ICD-10-CM

## 2023-02-11 DIAGNOSIS — Z8261 Family history of arthritis: Secondary | ICD-10-CM

## 2023-02-11 DIAGNOSIS — Z96651 Presence of right artificial knee joint: Secondary | ICD-10-CM | POA: Diagnosis present

## 2023-02-11 DIAGNOSIS — J9611 Chronic respiratory failure with hypoxia: Secondary | ICD-10-CM | POA: Diagnosis present

## 2023-02-11 DIAGNOSIS — Z87891 Personal history of nicotine dependence: Secondary | ICD-10-CM

## 2023-02-11 DIAGNOSIS — Z91041 Radiographic dye allergy status: Secondary | ICD-10-CM

## 2023-02-11 DIAGNOSIS — D631 Anemia in chronic kidney disease: Secondary | ICD-10-CM | POA: Diagnosis present

## 2023-02-11 DIAGNOSIS — I5032 Chronic diastolic (congestive) heart failure: Secondary | ICD-10-CM

## 2023-02-11 DIAGNOSIS — Z91048 Other nonmedicinal substance allergy status: Secondary | ICD-10-CM

## 2023-02-11 DIAGNOSIS — Z7901 Long term (current) use of anticoagulants: Secondary | ICD-10-CM

## 2023-02-11 DIAGNOSIS — Z885 Allergy status to narcotic agent status: Secondary | ICD-10-CM

## 2023-02-11 DIAGNOSIS — Z882 Allergy status to sulfonamides status: Secondary | ICD-10-CM

## 2023-02-11 DIAGNOSIS — R0602 Shortness of breath: Secondary | ICD-10-CM | POA: Diagnosis not present

## 2023-02-11 DIAGNOSIS — Z881 Allergy status to other antibiotic agents status: Secondary | ICD-10-CM

## 2023-02-11 DIAGNOSIS — Z887 Allergy status to serum and vaccine status: Secondary | ICD-10-CM

## 2023-02-11 DIAGNOSIS — R000 Tachycardia, unspecified: Secondary | ICD-10-CM | POA: Diagnosis not present

## 2023-02-11 DIAGNOSIS — Z9981 Dependence on supplemental oxygen: Secondary | ICD-10-CM

## 2023-02-11 DIAGNOSIS — Z905 Acquired absence of kidney: Secondary | ICD-10-CM

## 2023-02-11 DIAGNOSIS — Z888 Allergy status to other drugs, medicaments and biological substances status: Secondary | ICD-10-CM

## 2023-02-11 DIAGNOSIS — Z9842 Cataract extraction status, left eye: Secondary | ICD-10-CM

## 2023-02-11 DIAGNOSIS — Z8249 Family history of ischemic heart disease and other diseases of the circulatory system: Secondary | ICD-10-CM

## 2023-02-11 DIAGNOSIS — Z96611 Presence of right artificial shoulder joint: Secondary | ICD-10-CM | POA: Diagnosis not present

## 2023-02-11 LAB — RESP PANEL BY RT-PCR (RSV, FLU A&B, COVID)  RVPGX2
Influenza A by PCR: NEGATIVE
Influenza B by PCR: NEGATIVE
Resp Syncytial Virus by PCR: NEGATIVE
SARS Coronavirus 2 by RT PCR: NEGATIVE

## 2023-02-11 LAB — COMPREHENSIVE METABOLIC PANEL
ALT: 17 U/L (ref 0–44)
AST: 20 U/L (ref 15–41)
Albumin: 3.4 g/dL — ABNORMAL LOW (ref 3.5–5.0)
Alkaline Phosphatase: 82 U/L (ref 38–126)
Anion gap: 14 (ref 5–15)
BUN: 29 mg/dL — ABNORMAL HIGH (ref 8–23)
CO2: 21 mmol/L — ABNORMAL LOW (ref 22–32)
Calcium: 8.5 mg/dL — ABNORMAL LOW (ref 8.9–10.3)
Chloride: 103 mmol/L (ref 98–111)
Creatinine, Ser: 1.33 mg/dL — ABNORMAL HIGH (ref 0.44–1.00)
GFR, Estimated: 40 mL/min — ABNORMAL LOW (ref >60)
Glucose, Bld: 160 mg/dL — ABNORMAL HIGH (ref 70–99)
Potassium: 4 mmol/L (ref 3.5–5.1)
Sodium: 138 mmol/L (ref 135–145)
Total Bilirubin: 0.6 mg/dL (ref 0.0–1.2)
Total Protein: 7.4 g/dL (ref 6.5–8.1)

## 2023-02-11 LAB — CBC WITH DIFFERENTIAL/PLATELET
Abs Immature Granulocytes: 0.71 10*3/uL — ABNORMAL HIGH (ref 0.00–0.07)
Basophils Absolute: 0.2 10*3/uL — ABNORMAL HIGH (ref 0.0–0.1)
Basophils Relative: 1 %
Eosinophils Absolute: 0.3 10*3/uL (ref 0.0–0.5)
Eosinophils Relative: 2 %
HCT: 35.6 % — ABNORMAL LOW (ref 36.0–46.0)
Hemoglobin: 11.2 g/dL — ABNORMAL LOW (ref 12.0–15.0)
Immature Granulocytes: 5 %
Lymphocytes Relative: 20 %
Lymphs Abs: 3.2 10*3/uL (ref 0.7–4.0)
MCH: 28 pg (ref 26.0–34.0)
MCHC: 31.5 g/dL (ref 30.0–36.0)
MCV: 89 fL (ref 80.0–100.0)
Monocytes Absolute: 1.3 10*3/uL — ABNORMAL HIGH (ref 0.1–1.0)
Monocytes Relative: 8 %
Neutro Abs: 10.3 10*3/uL — ABNORMAL HIGH (ref 1.7–7.7)
Neutrophils Relative %: 64 %
Platelets: 281 10*3/uL (ref 150–400)
RBC: 4 MIL/uL (ref 3.87–5.11)
RDW: 15.3 % (ref 11.5–15.5)
WBC: 15.9 10*3/uL — ABNORMAL HIGH (ref 4.0–10.5)
nRBC: 0 % (ref 0.0–0.2)

## 2023-02-11 LAB — BRAIN NATRIURETIC PEPTIDE: B Natriuretic Peptide: 195.9 pg/mL — ABNORMAL HIGH (ref 0.0–100.0)

## 2023-02-11 LAB — TROPONIN I (HIGH SENSITIVITY): Troponin I (High Sensitivity): 18 ng/L — ABNORMAL HIGH (ref <18)

## 2023-02-11 MED ORDER — AMLODIPINE BESYLATE 5 MG PO TABS
5.0000 mg | ORAL_TABLET | Freq: Two times a day (BID) | ORAL | Status: DC
  Administered 2023-02-12 – 2023-02-16 (×10): 5 mg via ORAL
  Filled 2023-02-11 (×10): qty 1

## 2023-02-11 MED ORDER — CEFTRIAXONE SODIUM 1 G IJ SOLR
1.0000 g | Freq: Once | INTRAMUSCULAR | Status: AC
  Administered 2023-02-11: 1 g via INTRAVENOUS
  Filled 2023-02-11: qty 10

## 2023-02-11 MED ORDER — MIRABEGRON ER 25 MG PO TB24
25.0000 mg | ORAL_TABLET | Freq: Every day | ORAL | Status: DC
  Administered 2023-02-12 – 2023-02-16 (×5): 25 mg via ORAL
  Filled 2023-02-11 (×5): qty 1

## 2023-02-11 MED ORDER — SODIUM CHLORIDE 0.9 % IV SOLN
500.0000 mg | Freq: Once | INTRAVENOUS | Status: DC
  Filled 2023-02-11: qty 5

## 2023-02-11 MED ORDER — DM-GUAIFENESIN ER 30-600 MG PO TB12
1.0000 | ORAL_TABLET | Freq: Two times a day (BID) | ORAL | Status: DC | PRN
  Administered 2023-02-11 – 2023-02-16 (×9): 1 via ORAL
  Filled 2023-02-11 (×9): qty 1

## 2023-02-11 MED ORDER — TRAMADOL HCL 50 MG PO TABS
50.0000 mg | ORAL_TABLET | Freq: Two times a day (BID) | ORAL | Status: DC | PRN
  Administered 2023-02-12: 50 mg via ORAL
  Filled 2023-02-11: qty 1

## 2023-02-11 MED ORDER — IPRATROPIUM-ALBUTEROL 0.5-2.5 (3) MG/3ML IN SOLN
3.0000 mL | RESPIRATORY_TRACT | Status: DC
  Administered 2023-02-11 – 2023-02-13 (×8): 3 mL via RESPIRATORY_TRACT
  Filled 2023-02-11 (×5): qty 3

## 2023-02-11 MED ORDER — LAMOTRIGINE 100 MG PO TABS
100.0000 mg | ORAL_TABLET | Freq: Two times a day (BID) | ORAL | Status: DC
  Administered 2023-02-12 – 2023-02-16 (×10): 100 mg via ORAL
  Filled 2023-02-11 (×10): qty 1

## 2023-02-11 MED ORDER — IPRATROPIUM-ALBUTEROL 0.5-2.5 (3) MG/3ML IN SOLN
3.0000 mL | Freq: Once | RESPIRATORY_TRACT | Status: AC
  Administered 2023-02-11: 3 mL via RESPIRATORY_TRACT
  Filled 2023-02-11: qty 9

## 2023-02-11 MED ORDER — IPRATROPIUM-ALBUTEROL 0.5-2.5 (3) MG/3ML IN SOLN
9.0000 mL | Freq: Once | RESPIRATORY_TRACT | Status: AC
  Administered 2023-02-11: 9 mL via RESPIRATORY_TRACT
  Filled 2023-02-11: qty 9

## 2023-02-11 MED ORDER — AZITHROMYCIN 500 MG PO TABS: 500.0000 mg | ORAL_TABLET | Freq: Every day | ORAL | Status: DC

## 2023-02-11 MED ORDER — AZITHROMYCIN 500 MG PO TABS: 250.0000 mg | ORAL_TABLET | Freq: Every day | ORAL | Status: DC

## 2023-02-11 MED ORDER — ESCITALOPRAM OXALATE 10 MG PO TABS
10.0000 mg | ORAL_TABLET | Freq: Every day | ORAL | Status: DC
  Administered 2023-02-12 – 2023-02-16 (×5): 10 mg via ORAL
  Filled 2023-02-11 (×5): qty 1

## 2023-02-11 MED ORDER — TORSEMIDE 20 MG PO TABS
20.0000 mg | ORAL_TABLET | Freq: Every day | ORAL | Status: DC
Start: 2023-02-12 — End: 2023-02-16
  Administered 2023-02-12 – 2023-02-16 (×5): 20 mg via ORAL
  Filled 2023-02-11 (×5): qty 1

## 2023-02-11 MED ORDER — QUETIAPINE FUMARATE 25 MG PO TABS
50.0000 mg | ORAL_TABLET | Freq: Every day | ORAL | Status: DC
  Administered 2023-02-12 – 2023-02-15 (×5): 50 mg via ORAL
  Filled 2023-02-11 (×5): qty 2

## 2023-02-11 MED ORDER — METHOCARBAMOL 500 MG PO TABS
500.0000 mg | ORAL_TABLET | Freq: Three times a day (TID) | ORAL | Status: DC
  Administered 2023-02-12 – 2023-02-16 (×14): 500 mg via ORAL
  Filled 2023-02-11 (×14): qty 1

## 2023-02-11 MED ORDER — GABAPENTIN 300 MG PO CAPS
600.0000 mg | ORAL_CAPSULE | Freq: Every day | ORAL | Status: DC
  Administered 2023-02-12 – 2023-02-15 (×5): 600 mg via ORAL
  Filled 2023-02-11 (×5): qty 2

## 2023-02-11 MED ORDER — METHYLPREDNISOLONE SODIUM SUCC 125 MG IJ SOLR
80.0000 mg | Freq: Every day | INTRAMUSCULAR | Status: DC
  Administered 2023-02-12 – 2023-02-14 (×3): 80 mg via INTRAVENOUS
  Filled 2023-02-11 (×3): qty 2

## 2023-02-11 MED ORDER — BUSPIRONE HCL 10 MG PO TABS
5.0000 mg | ORAL_TABLET | Freq: Two times a day (BID) | ORAL | Status: DC
  Administered 2023-02-12 – 2023-02-16 (×10): 5 mg via ORAL
  Filled 2023-02-11 (×10): qty 1

## 2023-02-11 MED ORDER — FAMOTIDINE 20 MG PO TABS
20.0000 mg | ORAL_TABLET | Freq: Every day | ORAL | Status: DC
  Administered 2023-02-12: 20 mg via ORAL
  Filled 2023-02-11: qty 1

## 2023-02-11 MED ORDER — PRAVASTATIN SODIUM 20 MG PO TABS
20.0000 mg | ORAL_TABLET | Freq: Every day | ORAL | Status: DC
  Administered 2023-02-12 – 2023-02-15 (×5): 20 mg via ORAL
  Filled 2023-02-11 (×5): qty 1

## 2023-02-11 MED ORDER — MONTELUKAST SODIUM 10 MG PO TABS
10.0000 mg | ORAL_TABLET | Freq: Every day | ORAL | Status: DC
  Administered 2023-02-12 – 2023-02-16 (×5): 10 mg via ORAL
  Filled 2023-02-11 (×5): qty 1

## 2023-02-11 MED ORDER — APIXABAN 5 MG PO TABS
5.0000 mg | ORAL_TABLET | Freq: Two times a day (BID) | ORAL | Status: DC
  Administered 2023-02-12 – 2023-02-16 (×10): 5 mg via ORAL
  Filled 2023-02-11 (×10): qty 1

## 2023-02-11 MED ORDER — GABAPENTIN 300 MG PO CAPS: 300.0000 mg | ORAL_CAPSULE | Freq: Two times a day (BID) | ORAL | Status: DC

## 2023-02-11 MED ORDER — PREDNISONE 20 MG PO TABS
60.0000 mg | ORAL_TABLET | Freq: Once | ORAL | Status: AC
  Administered 2023-02-11: 60 mg via ORAL
  Filled 2023-02-11: qty 3

## 2023-02-11 MED ORDER — AZITHROMYCIN 250 MG PO TABS: 250.0000 mg | ORAL_TABLET | Freq: Every day | ORAL | Status: DC

## 2023-02-11 MED ORDER — PANTOPRAZOLE SODIUM 40 MG PO TBEC
40.0000 mg | DELAYED_RELEASE_TABLET | Freq: Two times a day (BID) | ORAL | Status: DC
  Administered 2023-02-12 – 2023-02-16 (×9): 40 mg via ORAL
  Filled 2023-02-11 (×9): qty 1

## 2023-02-11 MED ORDER — PROSIGHT PO TABS
1.0000 | ORAL_TABLET | Freq: Every day | ORAL | Status: DC
  Administered 2023-02-12 – 2023-02-16 (×5): 1 via ORAL
  Filled 2023-02-11 (×5): qty 1

## 2023-02-11 MED ORDER — SUCRALFATE 1 G PO TABS
1.0000 g | ORAL_TABLET | Freq: Two times a day (BID) | ORAL | Status: DC
Start: 2023-02-11 — End: 2023-02-16
  Administered 2023-02-12 – 2023-02-16 (×10): 1 g via ORAL
  Filled 2023-02-11 (×10): qty 1

## 2023-02-11 MED ORDER — LORATADINE 10 MG PO TABS
10.0000 mg | ORAL_TABLET | Freq: Every day | ORAL | Status: DC
  Administered 2023-02-12 – 2023-02-15 (×5): 10 mg via ORAL
  Filled 2023-02-11 (×5): qty 1

## 2023-02-11 MED ORDER — DICYCLOMINE HCL 10 MG PO CAPS: 10.0000 mg | ORAL_CAPSULE | Freq: Two times a day (BID) | ORAL | Status: DC | PRN

## 2023-02-11 MED ORDER — GABAPENTIN 300 MG PO CAPS
300.0000 mg | ORAL_CAPSULE | Freq: Every day | ORAL | Status: DC
  Administered 2023-02-12 – 2023-02-16 (×5): 300 mg via ORAL
  Filled 2023-02-11 (×5): qty 1

## 2023-02-11 MED ORDER — NEBIVOLOL HCL 5 MG PO TABS
5.0000 mg | ORAL_TABLET | Freq: Two times a day (BID) | ORAL | Status: DC
  Administered 2023-02-12 – 2023-02-16 (×9): 5 mg via ORAL
  Filled 2023-02-11 (×11): qty 1

## 2023-02-11 MED ORDER — SODIUM CHLORIDE 0.9 % IV SOLN
500.0000 mg | Freq: Once | INTRAVENOUS | Status: AC
  Administered 2023-02-11: 500 mg via INTRAVENOUS

## 2023-02-11 MED ORDER — LEFLUNOMIDE 20 MG PO TABS
20.0000 mg | ORAL_TABLET | Freq: Every day | ORAL | Status: DC
  Filled 2023-02-11: qty 1

## 2023-02-11 MED ORDER — ALBUTEROL SULFATE (2.5 MG/3ML) 0.083% IN NEBU: 2.5000 mg | INHALATION_SOLUTION | RESPIRATORY_TRACT | Status: DC | PRN

## 2023-02-11 NOTE — ED Notes (Signed)
 First Nurse Note: Pt to ED via [facility] from home for SOB with a productive cough that is yellow. Pt sts that she has ha pulmonology apt next week. Pt sts that her breathing treatments are not working. Pt is on 3l/min via [place] at baseline. VS WNL per EMS.

## 2023-02-11 NOTE — ED Notes (Signed)
 See triage note  Presents with increased SOB  States she wear o2 daily   Just feels like she can't get her breath

## 2023-02-11 NOTE — ED Notes (Signed)
 Patient assisted to the bathroom via WC. Pt went without her oxygen, saturation maintained above 95%.

## 2023-02-11 NOTE — ED Provider Notes (Signed)
 [facility] Provider Note    Event Date/Time   First MD Initiated Contact with Patient 02/11/23 1740     (approximate)   History   Chief Complaint Shortness of Breath   HPI  Kerri Carter is a 83 y.o. female with past medical history of hypertension, COPD, chronic hypoxic respiratory failure on 3 L, CHF, rheumatoid arthritis, iron  deficiency anemia, and CKD who presents to the ED complaining of shortness of breath.  Patient reports that she has been dealing with a productive cough for the past 3 days, has been feeling increasingly short of breath over the past 2 days.  She states she feels particularly short of breath with exertion, has difficulty walking the length of a room without getting out of breath.  She denies any fevers and has not had any pain in her chest.  Denies any pain or swelling in her legs.     Physical Exam   Triage Vital Signs: ED Triage Vitals [02/11/23 1348]  Encounter Vitals Group     BP 126/66     Systolic BP Percentile      Diastolic BP Percentile      Pulse      Resp (!) 22     Temp 98 F (36.7 C)     Temp Source Oral     SpO2 98 %     Weight      Height      Head Circumference      Peak Flow      Pain Score 0     Pain Loc      Pain Education      Exclude from Growth Chart     Most recent vital signs: Vitals:   02/11/23 1348 02/11/23 1839  BP: 126/66 (!) 146/76  Pulse:  88  Resp: (!) 22 (!) 22  Temp: 98 F (36.7 C) 98 F (36.7 C)  SpO2: 98% 98%    Constitutional: Alert and oriented. Eyes: Conjunctivae are normal. Head: Atraumatic. Nose: No congestion/rhinnorhea. Mouth/Throat: Mucous membranes are moist.  Cardiovascular: Normal rate, regular rhythm. Grossly normal heart sounds.  2+ radial pulses bilaterally. Respiratory: Mildly tachypneic with normal respiratory effort.  No retractions. Lungs with expiratory wheezing throughout. Gastrointestinal: Soft and nontender. No distention. Musculoskeletal: No  lower extremity tenderness nor edema.  Neurologic:  Normal speech and language. No gross focal neurologic deficits are appreciated.    ED Results / Procedures / Treatments   Labs (all labs ordered are listed, but only abnormal results are displayed) Labs Reviewed  COMPREHENSIVE METABOLIC PANEL - Abnormal; Notable for the following components:      Result Value   CO2 21 (*)    Glucose, Bld 160 (*)    BUN 29 (*)    Creatinine, Ser 1.33 (*)    Calcium  8.5 (*)    Albumin  3.4 (*)    GFR, Estimated 40 (*)    All other components within normal limits  CBC WITH DIFFERENTIAL/PLATELET - Abnormal; Notable for the following components:   WBC 15.9 (*)    Hemoglobin 11.2 (*)    HCT 35.6 (*)    Neutro Abs 10.3 (*)    Monocytes Absolute 1.3 (*)    Basophils Absolute 0.2 (*)    Abs Immature Granulocytes 0.71 (*)    All other components within normal limits  BRAIN NATRIURETIC PEPTIDE - Abnormal; Notable for the following components:   B Natriuretic Peptide 195.9 (*)    All other components within  normal limits  TROPONIN I (HIGH SENSITIVITY) - Abnormal; Notable for the following components:   Troponin I (High Sensitivity) 18 (*)    All other components within normal limits  RESP PANEL BY RT-PCR (RSV, FLU A&B, COVID)  [id number]  CULTURE, BLOOD (ROUTINE X 2)  CULTURE, BLOOD (ROUTINE X 2)  RESPIRATORY PANEL BY PCR  EXPECTORATED SPUTUM ASSESSMENT W GRAM STAIN, RFLX TO RESP C  BASIC METABOLIC PANEL  CBC     EKG  ED ECG REPORT I, Carlin Palin, the attending physician, personally viewed and interpreted this ECG.   Date: 02/11/2023  EKG Time: 13:57  Rate: 86  Rhythm: normal sinus rhythm  Axis: Normal  Intervals:none  ST&T Change: LVH  RADIOLOGY Chest x-ray reviewed and interpreted by me with no infiltrate, edema, or effusion.  PROCEDURES:  Critical Care performed: No  Procedures   MEDICATIONS ORDERED IN ED: Medications  methylPREDNISolone  sodium succinate  (SOLU-MEDROL ) 125  mg/2 mL injection 80 mg (has no administration in time range)  ipratropium-albuterol  (DUONEB) 0.5-2.5 (3) MG/3ML nebulizer solution 3 mL (has no administration in time range)  albuterol  (PROVENTIL ) (2.5 MG/3ML) 0.083% nebulizer solution 2.5 mg (has no administration in time range)  dextromethorphan -guaiFENesin  (MUCINEX  DM) 30-600 MG per 12 hr tablet 1 tablet (has no administration in time range)  azithromycin  (ZITHROMAX ) tablet 250 mg (has no administration in time range)  azithromycin  (ZITHROMAX ) 500 mg in sodium chloride  0.9 % 250 mL IVPB (has no administration in time range)  ipratropium-albuterol  (DUONEB) 0.5-2.5 (3) MG/3ML nebulizer solution 9 mL (9 mLs Nebulization Given 02/11/23 1818)  predniSONE  (DELTASONE ) tablet 60 mg (60 mg Oral Given 02/11/23 1818)  ipratropium-albuterol  (DUONEB) 0.5-2.5 (3) MG/3ML nebulizer solution 3 mL (3 mLs Nebulization Given 02/11/23 2040)  cefTRIAXone  (ROCEPHIN ) 1 g in sodium chloride  0.9 % 100 mL IVPB (0 g Intravenous Stopped 02/11/23 2123)     IMPRESSION / MDM / ASSESSMENT AND PLAN / ED COURSE  I reviewed the triage vital signs and the nursing notes.                              83 y.o. female with past medical history of hypertension, COPD, hypoxic respiratory failure on 3 L, CHF, anemia, rheumatoid arthritis, and CKD who presents to the ED with productive cough and difficulty breathing for the past 3 days.  Patient's presentation is most consistent with acute presentation with potential threat to life or bodily function.  Differential diagnosis includes, but is not limited to, ACS, PE, pneumonia, bronchitis, COVID-19, influenza, COPD exacerbation, CHF exacerbation.  Patient nontoxic-appearing and in no acute distress, vital signs are remarkable for mild tachypnea but otherwise reassuring.  She is not in any respiratory distress, does have significant expiratory wheezing but maintaining oxygen  saturations on her usual 3 L.  We will treat with DuoNeb and  prednisone , low suspicion for ACS or PE given unremarkable EKG and troponin within normal limits.  Renal function slightly worse compared to previous without significant electrolyte abnormality.  She has a mild leukocytosis, with her productive cough and COPD history, may benefit from course of antibiotics.  LFTs are unremarkable, COVID and flu testing pending at this time.  Plan to reassess following breathing treatments.  Wheezing improved but patient continues to feel short of breath with any exertion, is unable to stand and walk due to difficulty breathing.  Case discussed with hospitalist for admission.      FINAL CLINICAL IMPRESSION(S) / ED DIAGNOSES  Final diagnoses:  COPD exacerbation (HCC)     Rx / DC Orders   ED Discharge Orders     None        Note:  This document was prepared using Dragon voice recognition software and may include unintentional dictation errors.   Willo Dunnings, MD 02/11/23 2131

## 2023-02-11 NOTE — ED Provider Triage Note (Signed)
 Emergency Medicine Provider Triage Evaluation Note  Kerri Carter , a 83 y.o. female  was evaluated in triage.  Pt complains of SOB that began yesterday. Wears 3L O2 [place] chronically. Denies sick contacts.   Review of Systems  Positive: Cough, SOB Negative:   Physical Exam  There were no vitals taken for this visit. Gen:   Awake, no distress   Resp:  Normal effort  MSK:   Moves extremities without difficulty  Other:    Medical Decision Making  Medically screening exam initiated at 1:47 PM.  Appropriate orders placed.  Kerri Carter was informed that the remainder of the evaluation will be completed by another provider, this initial triage assessment does not replace that evaluation, and the importance of remaining in the ED until their evaluation is complete.     Cleaster Tinnie LABOR, PA-C 02/11/23 1351

## 2023-02-11 NOTE — ED Triage Notes (Signed)
 Pt c/o SOB and cough that started yesterday. Pt wears 3L O2 chronically for COPD

## 2023-02-11 NOTE — H&P (Signed)
 History and Physical    Kerri Carter FMW:[id number] DOB: May 02, 1940 DOA: 02/11/2023  Referring MD/NP/PA:   PCP: Hope Merle, MD   Patient coming from:  The patient is coming from home.     Chief Complaint: Shortness of breath  HPI: Kerri Carter is a 83 y.o. female with medical history significant of ILD and COPD on 2-3 L oxygen , hypertension, hyperlipidemia, PVD, CAD, diastolic CHF, depression with anxiety, bipolar, CKD-3B, A-fib on Eliquis , OSA on CPAP, obesity, who presents with shortness of breath.  Patient states that she has progressively worsening shortness breath for more than 3 days.  She has cough with yellow-colored sputum production.  No chest pain, fever or chills.  No nausea, vomiting, diarrhea or abdominal pain.  No symptoms of UTI.  Data reviewed independently and ED Course: pt was found to have WBC 15.9, negative PCR for COVID, flu and RSV, renal function close to baseline, troponin level 18 (baseline creatinine 24-56 recently).  Temperature normal, blood pressure 146/76, heart rate 88, RR 22, oxygen  saturation 99% on 2-3 L oxygen . Chest x-ray negative for infiltration.  Patient is placed on telemetry bed for observation.   EKG: I have personally reviewed.QTc 426,, early R wave progression.   Review of Systems:   General: no fevers, chills, no body weight gain, has fatigue HEENT: no blurry vision, hearing changes or sore throat Respiratory: has dyspnea, coughing, wheezing CV: no chest pain, no palpitations GI: no nausea, vomiting, abdominal pain, diarrhea, constipation GU: no dysuria, burning on urination, increased urinary frequency, hematuria  Ext: no leg edema Neuro: no unilateral weakness, numbness, or tingling, no vision change or hearing loss Skin: no rash, no skin tear. MSK: No muscle spasm, has finger deformity, no limitation of range of movement in spin Heme: No easy bruising.  Travel history: No recent long distant travel.   Allergy:  Allergies   Allergen Reactions   Ceftin [Cefuroxime Axetil] Anaphylaxis   Lisinopril Anaphylaxis   Sulfa Antibiotics Other (See Comments)    Face swelling   Sulfasalazine Anaphylaxis   Morphine  Other (See Comments)    Per patient, low blood pressure issues that requires action to raise it back up. Can take small infrequent doses   Xarelto [Rivaroxaban] Other (See Comments)    Stomach burning, bleeding, and tar in stool   Adhesive [Tape] Rash    Paper tape and tega derm OK   Antihistamines, Chlorpheniramine-Type Other (See Comments)    Makes pt hyper   Antivert [Meclizine Hcl] Other (See Comments)    Bladder will not empty   Aspirin  Other (See Comments)    Sulfasalazine allergy cross reacts   Contrast Media [Iodinated Contrast Media] Rash    she is able to use betadine scrubs.   Decongestant [Pseudoephedrine Hcl] Other (See Comments)    Makes pt hyper   Doxycycline Other (See Comments)    GI upset   Levaquin  [Levofloxacin  In D5w] Rash   Polymyxin B  Other (See Comments)    Facial rash   Tetanus Toxoids Rash and Other (See Comments)    Fever and hot to touch at injection site    Past Medical History:  Diagnosis Date   Acromial process of scapula fracture 12/21/2019   Acute kidney injury superimposed on chronic kidney disease (HCC) 05/26/2021   Acute on chronic combined systolic (congestive) and diastolic (congestive) heart failure (HCC) 07/28/2021   Acute on chronic combined systolic and diastolic CHF (congestive heart failure) (HCC) 05/26/2021   Acute on chronic respiratory failure with  hypoxia (HCC) 09/10/2021   Acute pain of left knee 12/18/2020   Anemia in stage 3a chronic kidney disease (HCC) 10/09/2021   Anxiety    Anxiety 01/20/2019   Anxiety and depression 05/26/2021   Aortic stenosis 07/09/2015   a.) TTE 07/06/2015: EF 55-60%; mild AS with MPG 13.0 mmHg.   Arrhythmia    atrial fibrillation   Assistance needed for mobility 12/18/2020   Asthma    At high risk for falls  03/16/2019   Avascular necrosis of left shoulder due to adverse effect of steroid therapy (HCC) 01/20/2018   Bipolar disorder (HCC)    C. difficile diarrhea 2010   a.) following ABX course during hospital admission   Carotid atherosclerosis, bilateral    a.) Moderate; < 50% stenosis BILATERAL ICAs.   Cataract    a.) s/p BILATERAL extraction   CHF (congestive heart failure) (HCC)    Chicken pox    Chronic left shoulder pain 08/29/2019   Chronic respiratory failure with hypoxia (HCC) 10/01/2021   CKD (chronic kidney disease), stage III (HCC)    a. s/p R nephrectomy./ aneurysm   Closed fracture of acromial process of scapula 10/28/2018   Closed nondisplaced fracture of proximal phalanx of lesser toe of right foot    Collapsed vertebra, not elsewhere classified, lumbar region, initial encounter for fracture (HCC) 02/21/2019   Community acquired pneumonia 07/22/2021   Complicated grief 03/16/2019   Compression fracture of L4 vertebra (HCC) 02/21/2019   Conversion disorder    Conversion disorder 08/04/2015   COPD (chronic obstructive pulmonary disease) (HCC)    COPD with exacerbation (HCC) 08/20/2021   Cough 05/02/2012   Depression    Dislocation of hip joint prosthesis (HCC) 01/08/2017   Elevated brain natriuretic peptide (BNP) level 12/18/2020   Elevated d-dimer 05/26/2021   Episodic mood disorder (HCC) 04/18/2019   Essential hypertension    Fatigue 07/07/2018   Frequent falls 12/18/2020   GERD (gastroesophageal reflux disease)    H/O cardiac catheterization 02/24/2011   Formatting of this note might be different from the original.  Repeated at [facility] 2/13 and insignificant disease   Hav (hallux abducto valgus), unspecified laterality 12/05/2018   Heart murmur    Hip fracture (HCC) 10/31/2016   Hip pain 12/18/2020   History of fracture of left hip 01/15/2017   History of syncope 08/25/2016   Hyperkalemia 05/26/2021   - The patient was given p.o. Lokelma  and IV Lasix .  -  We will follow potassium level.   Hyperlipidemia    Hypokalemia 08/25/2016   ILD (interstitial lung disease) (HCC)    mild; 2/2 RA diagnosis   Iliopsoas bursitis of right hip 08/29/2019   Inflammatory arthritis    a. hands/carpal tunnel.  b. Low titer rheumatoid factor. c. Negative anti-CCP antibodies. d. Plaquenil.   Insomnia 07/17/2015   Intractable nausea and vomiting 08/10/2022   Left foot pain 10/19/2016   Left lower lobe pneumonia 05/26/2021   Leg edema 05/02/2019   Leg pain, bilateral    Leg swelling 12/15/2016   Leukocytosis 10/19/2017   Leukopenia 06/24/2021   Lymphocytosis 07/13/2021   Macular degeneration    Microcytic anemia 08/15/2015   Mixed bipolar I disorder (HCC) 10/09/2015   Nocturnal hypoxemia    Nocturnal hypoxemia 09/09/2011   June 2013 overnight oximetry on 6 centimeters of water  CPAP: SpO2 less than 88% 72% of the time   Non-Obstructive CAD    a. 07/2009 Cath ([facility]): nonobs dzs;  b. 03/2011 Cath [facility]): nonobs dzs.  NSIP (nonspecific interstitial pneumonitis) (HCC) 12/08/2019   Osteoarthritis    a. Knees.   Other shoulder lesions, left shoulder 12/20/2017   Other specified postprocedural states 07/20/2013   Overweight (BMI 25.0-29.9) 08/29/2019   PAD (peripheral artery disease) (HCC)    Pain due to onychomycosis of toenails of both feet 12/05/2018   Pelvic mass in female 03/16/2019   02/21/19 CT pelvis w/o contrast  Cystic left adnexal lesion has enlarged since 2018 where it  measured approximately 1.8 x 1.7 cm. This is not well assessed due  to streak artifact as well as lack of contrast. Ultrasound follow-up  may be helpful in 8-12 weeks      04/05/19 TVUS     IMPRESSION:  Simple appearing cystic structure within the left ovary measuring up  to 2.9 cm. While this has enlarged s   Physical deconditioning 02/27/2021   Pleural effusion on left 12/18/2020   PUD (peptic ulcer disease)    Recurrent falls 09/01/2021   Recurrent pneumonia 07/23/2021   Repeated  falls 12/18/2020   Respiratory failure with hypoxia (HCC) 01/12/2021   Rotator cuff tendinitis, right 07/26/2017   S/P lumbar fusion 05/23/2021   S/P right hip fracture    11/01/16 s/p repair   Sepsis due to pneumonia (HCC) 07/22/2021   Shoulder pain    Sleep apnea    no cpap / minimal   SOB (shortness of breath) 10/13/2011   2011 [facility] >> LHC (non-obstructive CAD), RHC (wedge 13, mean PA 26), TTE (LVEF > 55%, mild concentric LVH, mild AR, MR, PR, TR), CTAngio chest unrevealing; well controlled asthma  02/2011 TTE [facility]>> NL LVEF, Mild LVH, Mod AR/Mod MR   Spinal stenosis at L4-L5 level    severe with L4/L5 anterolisthesis grade 1 anterolisthesis    Spinal stenosis of lumbar region 09/15/2016   Last Assessment & Plan:   Now post op with continued back and leg pain noted. Is working with pt/ot and surgery was said to have no complications. Bowels are moving and general pain and anxiety is basically baseline.    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   Weakness of both lower extremities 12/18/2020    Past Surgical History:  Procedure Laterality Date   APPENDECTOMY     APPLICATION OF INTRAOPERATIVE CT SCAN N/A 05/23/2021   Procedure: APPLICATION OF INTRAOPERATIVE CT SCAN;  Surgeon: Clois Fret, MD;  Location: [facility];  Service: Neurosurgery;  Laterality: N/A;   BACK SURGERY     lumbar fusion   BUNIONECTOMY Right    CATARACT EXTRACTION,  BILATERAL     CESAREAN SECTION     x1   CHOLECYSTECTOMY N/A 05/11/2016   Procedure: LAPAROSCOPIC CHOLECYSTECTOMY;  Surgeon: Charlie FORBES Fell, MD;  Location: [facility];  Service: General;  Laterality: N/A;   COLONOSCOPY WITH PROPOFOL  N/A 04/02/2016   Procedure: COLONOSCOPY WITH PROPOFOL ;  Surgeon: Ruel Kung, MD;  Location: [facility];  Service: Endoscopy;  Laterality: N/A;   ENDOSCOPIC  RETROGRADE CHOLANGIOPANCREATOGRAPHY (ERCP) WITH PROPOFOL  N/A 05/08/2016   Procedure: ENDOSCOPIC RETROGRADE CHOLANGIOPANCREATOGRAPHY (ERCP) WITH PROPOFOL ;  Surgeon: Rogelia Copping, MD;  Location: [facility];  Service: Endoscopy;  Laterality: N/A;   ERCP     with biliary spincterotomy 05/08/16 Dr. Copping for choledocholithiasis    ESOPHAGEAL DILATION  04/02/2016   Procedure: ESOPHAGEAL DILATION;  Surgeon: Ruel Kung, MD;  Location: [facility];  Service: Endoscopy;;   ESOPHAGOGASTRODUODENOSCOPY (EGD) WITH PROPOFOL  N/A 04/02/2016   Procedure: ESOPHAGOGASTRODUODENOSCOPY (EGD) WITH PROPOFOL ;  Surgeon: Ruel Kung, MD;  Location: [facility];  Service: Endoscopy;  Laterality: N/A;   HIP ARTHROPLASTY Right 11/01/2016   Procedure: ARTHROPLASTY BIPOLAR HIP (HEMIARTHROPLASTY);  Surgeon: Edie Norleen PARAS, MD;  Location: [facility];  Service: Orthopedics;  Laterality: Right;   LUMBAR LAMINECTOMY/DECOMPRESSION MICRODISCECTOMY N/A 12/11/2020   Procedure: LEFT L2-3 MICRODISCECTOMY, L3-4 AND L5-S1 DECOMPRESSION;  Surgeon: Clois Fret, MD;  Location: [facility];  Service: Neurosurgery;  Laterality: N/A;   NEPHRECTOMY  1988   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   ORIF SCAPHOID FRACTURE Left 12/21/2019   Procedure: OPEN REDUCTION INTERNAL FIXATION (ORIF) OF LEFT SCAPULAR NONUNION WITH BONE GRAFT;  Surgeon: Edie Norleen PARAS, MD;  Location: [facility];  Service: Orthopedics;  Laterality: Left;   osteoporosis     noted DEXA 08/19/16    REPLACEMENT TOTAL KNEE Right    REVERSE SHOULDER ARTHROPLASTY Right 11/04/2017    Procedure: REVERSE SHOULDER ARTHROPLASTY;  Surgeon: Edie Norleen PARAS, MD;  Location: [facility];  Service: Orthopedics;  Laterality: Right;   REVERSE SHOULDER ARTHROPLASTY Left 07/26/2018   Procedure: REVERSE SHOULDER ARTHROPLASTY;  Surgeon: Edie Norleen PARAS, MD;  Location: [facility];  Service: Orthopedics;  Laterality: Left;   TONSILLECTOMY     TOTAL HIP ARTHROPLASTY  12/10/11   [facility] left hip   TOTAL HIP ARTHROPLASTY Bilateral    TRANSFORAMINAL LUMBAR INTERBODY FUSION (TLIF) WITH PEDICLE SCREW FIXATION 3 LEVEL N/A 05/23/2021   Procedure: OPEN L2-4 TRANSFORAMINAL LUMBAR INTERBODY FUSION (TLIF) WITH L2-5 POSTERIOR SPINAL FUSION;  Surgeon: Clois Fret, MD;  Location: [facility];  Service: Neurosurgery;  Laterality: N/A;   TUBAL LIGATION      Social History:  reports that she quit smoking about 49 years ago. Her smoking use included cigarettes. She started smoking about 69 years ago. She has a 10 pack-year smoking history. She has never used smokeless tobacco. She reports that she does not drink alcohol  and does not use drugs.  Family History:  Family History  Problem Relation Age of Onset   Rheum arthritis Mother    Asthma Mother    Parkinson's disease Mother    Heart disease Mother    Stroke Mother    Hypertension Mother    Heart attack Father    Heart disease Father    Hypertension Father    Peripheral Artery Disease Father    Diabetes Son    Gout Son    Asthma Sister    Heart disease Sister    Lung cancer Sister    Heart disease Sister    Heart disease Sister    Breast cancer Sister    Heart attack Sister    Heart disease Brother    Heart disease Maternal Grandmother    Diabetes Maternal Grandmother    Colon cancer Maternal Grandmother    Cancer Maternal Grandmother        Hodgkins lymphoma   Heart disease Brother    Alcohol  abuse Brother    Depression Brother  Dementia Son      Prior to Admission medications   Medication Sig Start Date End Date Taking? Authorizing  Provider  albuterol  (PROVENTIL ) (2.5 MG/3ML) 0.083% nebulizer solution Take 3 mLs (2.5 mg total) by nebulization every 6 (six) hours as needed for wheezing or shortness of breath. 11/17/22   Tamea Dedra CROME, MD  albuterol  (VENTOLIN  HFA) [phone] 2500 Base) MCG/ACT inhaler Inhale 2 puffs into the lungs every 6 (six) hours as needed for wheezing or shortness of breath. 06/24/21   McLean-Scocuzza, Randine SAILOR, MD  amLODipine  (NORVASC ) 5 MG tablet Take 1 tablet (5 mg total) by mouth daily. Patient taking differently: Take 5 mg by mouth in the morning and at bedtime. 08/01/21   McLean-Scocuzza, Randine SAILOR, MD  apixaban  (ELIQUIS ) 5 MG TABS tablet Take 1 tablet (5 mg total) by mouth 2 (two) times daily. 10/15/21   McLean-Scocuzza, Randine SAILOR, MD  busPIRone  (BUSPAR ) 5 MG tablet Take 1 tablet (5 mg total) by mouth 2 (two) times daily. 06/02/21   Jens Durand, MD  cetirizine  (ZYRTEC ) 10 MG tablet TAKE 1 TABLET BY MOUTH EVERYDAY AT BEDTIME 06/08/22   Tamea Dedra CROME, MD  dicyclomine  (BENTYL ) 10 MG capsule TAKE 1 CAPSULE (10 MG TOTAL) BY MOUTH 2 (TWO) TIMES DAILY AS NEEDED FOR SPASMS. 10/02/22   Anna, Kiran, MD  ENBREL SURECLICK 50 MG/ML injection Inject 50 mg into the skin once a week. Takes on Fridays    [provider]  escitalopram  (LEXAPRO ) 10 MG tablet Take 1 tablet (10 mg total) by mouth daily. 08/01/21   McLean-Scocuzza, Randine SAILOR, MD  famotidine  (PEPCID ) 20 MG tablet TAKE 1 TABLET BY MOUTH EVERYDAY AT BEDTIME 10/08/22   Therisa Bi, MD  Fluticasone -Umeclidin-Vilant (TRELEGY ELLIPTA ) 100-62.5-25 MCG/ACT AEPB Inhale 1 puff into the lungs daily. 12/01/22   Tamea Dedra CROME, MD  gabapentin  (NEURONTIN ) 300 MG capsule Take 300 mg in the morning and 600 mg at night. 01/11/23   Hope Merle, MD  lamoTRIgine  (LAMICTAL ) 100 MG tablet TAKE 1 TAB 2 TIMES DAILY. FURTHER REFILLS NEW PSYCH FOR ALL PSYCH MEDS ONLY TEMP SUPPLY FROM PCP 05/02/21   McLean-Scocuzza, Randine SAILOR, MD  leflunomide  (ARAVA ) 20 MG tablet Take 1 tablet (20 mg  total) by mouth daily. 09/30/17   McLean-Scocuzza, Randine SAILOR, MD  lidocaine  (LIDODERM ) 5 % Place 1 patch onto the skin 2 (two) times daily as needed. Remove & Discard patch within 12 hours or as directed by MD 11/22/20   McLean-Scocuzza, Randine SAILOR, MD  lovastatin  (MEVACOR ) 20 MG tablet Take 1 tablet (20 mg total) by mouth daily at 6 PM. 08/04/22   Hope Merle, MD  mometasone  (NASONEX ) 50 MCG/ACT nasal spray USE 1 SPRAY TO EACH NOSTRIL TWICE A DAY 10/08/22   Tamea Dedra CROME, MD  montelukast  (SINGULAIR ) 10 MG tablet TAKE 1 TABLET BY MOUTH EVERY DAY 11/12/22   Tamea Dedra CROME, MD  multivitamin-lutein  (OCUVITE-LUTEIN ) CAPS capsule Take 1 capsule by mouth at bedtime.    [provider]  nebivolol  (BYSTOLIC ) 5 MG tablet Take 1 tablet (5 mg total) by mouth in the morning and at bedtime. (Note dose changed from 1/2 10 mg bid to 5 mg bid) 08/01/21   McLean-Scocuzza, Randine SAILOR, MD  ondansetron  (ZOFRAN ) 4 MG tablet Take 1 tablet (4 mg total) by mouth every 6 (six) hours as needed for nausea. 08/14/22   Fausto Sor A, DO  OXYGEN  Inhale 3 L into the lungs continuous.    [provider]  pantoprazole  (PROTONIX ) 40  MG tablet TAKE 1 TABLET BY MOUTH 2 TIMES DAILY 30 MIN BEFORE FOOD (NOTE REDUCTION IN FREQUENCY) 07/29/22   Hope Merle, MD  predniSONE  (DELTASONE ) 5 MG tablet TAKE 1 TABLET BY MOUTH EVERY DAY WITH BREAKFAST 01/04/23   Tamea Dedra CROME, MD  PROLIA  60 MG/ML SOSY injection Inject 60 mg into the skin every 6 (six) months. 03/09/22   [provider]  QUEtiapine  (SEROQUEL ) 25 MG tablet TAKE 1 TABLET (25 MG TOTAL) BY MOUTH AT BEDTIME. AGAIN LAST FILL FURTHER REFILLS FROM PSYCHIATRY NO EXCEPTIONS 09/13/20   McLean-Scocuzza, Randine SAILOR, MD  sucralfate  (CARAFATE ) 1 g tablet TAKE 1 TABLET BY MOUTH TWICE A DAY 11/02/22   Therisa Bi, MD  torsemide  (DEMADEX ) 20 MG tablet Take 2 tablets (40 mg total) by mouth daily. 07/19/22 09/15/22  Jhonny Calvin NOVAK, MD  traMADol  (ULTRAM ) 50 MG tablet Take 1  tablet (50 mg total) by mouth every 12 (twelve) hours as needed. 07/28/22   Marcelino Nurse, MD  Vibegron  (GEMTESA ) 75 MG TABS Take 1 tablet (75 mg total) by mouth daily. 02/08/23   Maurine Lukes, PA-C    Physical Exam: Vitals:   02/11/23 2330 02/12/23 0000 02/12/23 0030 02/12/23 0100  BP: 137/65 133/60 138/62 130/64  Pulse: (!) 101 (!) 101 91 100  Resp: 19 19 19 19   Temp:    98.2 F (36.8 C)  TempSrc:    Oral  SpO2: 97% 96% 95% 96%  Weight:      Height:       General: Not in acute distress HEENT:       Eyes: PERRL, EOMI, no jaundice       ENT: No discharge from the ears and nose, no pharynx injection, no tonsillar enlargement.        Neck: No JVD, no bruit, no mass felt. Heme: No neck lymph node enlargement. Cardiac: S1/S2, RRR, has a systolic murmurs, No gallops or rubs. Respiratory: Has wheezing bilaterally GI: Soft, nondistended, nontender, no rebound pain, no organomegaly, BS present. GU: No hematuria Ext: No pitting leg edema bilaterally. 1+DP/PT pulse bilaterally. Musculoskeletal:  has finger deformity, No joint redness or warmth, no limitation of ROM in spin. Skin: No rashes.  Neuro: Alert, oriented X3, cranial nerves II-XII grossly intact, moves all extremities normally. Psych: Patient is not psychotic, no suicidal or hemocidal ideation.  Labs on Admission: I have personally reviewed following labs and imaging studies  CBC: Recent Labs  Lab 02/11/23 1350  WBC 15.9*  NEUTROABS 10.3*  HGB 11.2*  HCT 35.6*  MCV 89.0  PLT 281   Basic Metabolic Panel: Recent Labs  Lab 02/11/23 1350  NA 138  K 4.0  CL 103  CO2 21*  GLUCOSE 160*  BUN 29*  CREATININE 1.33*  CALCIUM  8.5*   GFR: Estimated Creatinine Clearance: 31.1 mL/min (A) (by C-G formula based on SCr of 1.33 mg/dL (H)). Liver Function Tests: Recent Labs  Lab 02/11/23 1350  AST 20  ALT 17  ALKPHOS 82  BILITOT 0.6  PROT 7.4  ALBUMIN  3.4*   No results for input(s): LIPASE, AMYLASE in the  last 168 hours. No results for input(s): AMMONIA in the last 168 hours. Coagulation Profile: No results for input(s): INR, PROTIME in the last 168 hours. Cardiac Enzymes: No results for input(s): CKTOTAL, CKMB, CKMBINDEX, TROPONINI in the last 168 hours. BNP (last 3 results) No results for input(s): PROBNP in the last 8760 hours. HbA1C: No results for input(s): HGBA1C in the last 72 hours. CBG: No results  for input(s): GLUCAP in the last 168 hours. Lipid Profile: No results for input(s): CHOL, HDL, LDLCALC, TRIG, CHOLHDL, LDLDIRECT in the last 72 hours. Thyroid  Function Tests: No results for input(s): TSH, T4TOTAL, FREET4, T3FREE, THYROIDAB in the last 72 hours. Anemia Panel: No results for input(s): VITAMINB12, FOLATE, FERRITIN, TIBC, IRON , RETICCTPCT in the last 72 hours. Urine analysis:    Component Value Date/Time   COLORURINE YELLOW (A) 08/11/2022 0205   APPEARANCEUR HAZY (A) 08/11/2022 0205   APPEARANCEUR Clear 10/12/2017 1410   LABSPEC 1.013 08/11/2022 0205   LABSPEC 1.003 11/24/2013 2117   PHURINE 6.0 08/11/2022 0205   GLUCOSEU NEGATIVE 08/11/2022 0205   GLUCOSEU Negative 11/24/2013 2117   HGBUR NEGATIVE 08/11/2022 0205   BILIRUBINUR NEGATIVE 08/11/2022 0205   BILIRUBINUR Negative 10/12/2017 1410   BILIRUBINUR Negative 11/24/2013 2117   KETONESUR NEGATIVE 08/11/2022 0205   PROTEINUR NEGATIVE 08/11/2022 0205   NITRITE NEGATIVE 08/11/2022 0205   LEUKOCYTESUR NEGATIVE 08/11/2022 0205   LEUKOCYTESUR Negative 11/24/2013 2117   Sepsis Labs: @LABRCNTIP (procalcitonin:4,lacticidven:4) ) Recent Results (from the past 240 hours)  Resp panel by RT-PCR (RSV, Flu A&B, Covid) Anterior Nasal Swab     Status: None   Collection Time: 02/11/23  6:20 PM   Specimen: Anterior Nasal Swab  Result Value Ref Range Status   SARS Coronavirus 2 by RT PCR NEGATIVE NEGATIVE Final    Comment: (NOTE) SARS-CoV-2 target nucleic acids are NOT  DETECTED.  The SARS-CoV-2 RNA is generally detectable in upper respiratory specimens during the acute phase of infection. The lowest concentration of SARS-CoV-2 viral copies this assay can detect is 138 copies/mL. A negative result does not preclude SARS-Cov-2 infection and should not be used as the sole basis for treatment or other patient management decisions. A negative result may occur with  improper specimen collection/handling, submission of specimen other than nasopharyngeal swab, presence of viral mutation(s) within the areas targeted by this assay, and inadequate number of viral copies(<138 copies/mL). A negative result must be combined with clinical observations, patient history, and epidemiological information. The expected result is Negative.  Fact Sheet for Patients:  bloggercourse.com  Fact Sheet for Healthcare Providers:  seriousbroker.it  This test is no t yet approved or cleared by the [place]  FDA and  has been authorized for detection and/or diagnosis of SARS-CoV-2 by FDA under an Emergency Use Authorization (EUA). This EUA will remain  in effect (meaning this test can be used) for the duration of the COVID-19 declaration under Section 564(b)(1) of the Act, 21 U.S.C.section 360bbb-3(b)(1), unless the authorization is terminated  or revoked sooner.       Influenza A by PCR NEGATIVE NEGATIVE Final   Influenza B by PCR NEGATIVE NEGATIVE Final    Comment: (NOTE) The Xpert Xpress SARS-CoV-2/FLU/RSV plus assay is intended as an aid in the diagnosis of influenza from Nasopharyngeal swab specimens and should not be used as a sole basis for treatment. Nasal washings and aspirates are unacceptable for Xpert Xpress SARS-CoV-2/FLU/RSV testing.  Fact Sheet for Patients: bloggercourse.com  Fact Sheet for Healthcare Providers: seriousbroker.it  This test is not yet  approved or cleared by the [place]  FDA and has been authorized for detection and/or diagnosis of SARS-CoV-2 by FDA under an Emergency Use Authorization (EUA). This EUA will remain in effect (meaning this test can be used) for the duration of the COVID-19 declaration under Section 564(b)(1) of the Act, 21 U.S.C. section 360bbb-3(b)(1), unless the authorization is terminated or revoked.     Resp Syncytial Virus  by PCR NEGATIVE NEGATIVE Final    Comment: (NOTE) Fact Sheet for Patients: bloggercourse.com  Fact Sheet for Healthcare Providers: seriousbroker.it  This test is not yet approved or cleared by the [place]  FDA and has been authorized for detection and/or diagnosis of SARS-CoV-2 by FDA under an Emergency Use Authorization (EUA). This EUA will remain in effect (meaning this test can be used) for the duration of the COVID-19 declaration under Section 564(b)(1) of the Act, 21 U.S.C. section 360bbb-3(b)(1), unless the authorization is terminated or revoked.  Performed at [facility], [address] [place], [place] [address]   Respiratory (~20 pathogens) panel by PCR     Status: None   Collection Time: 02/11/23  8:38 PM   Specimen: Nasopharyngeal Swab; Respiratory  Result Value Ref Range Status   Adenovirus NOT DETECTED NOT DETECTED Final   Coronavirus 229E NOT DETECTED NOT DETECTED Final    Comment: (NOTE) The Coronavirus on the Respiratory Panel, DOES NOT test for the novel  Coronavirus (2019 nCoV)    Coronavirus HKU1 NOT DETECTED NOT DETECTED Final   Coronavirus NL63 NOT DETECTED NOT DETECTED Final   Coronavirus OC43 NOT DETECTED NOT DETECTED Final   Metapneumovirus NOT DETECTED NOT DETECTED Final   Rhinovirus / Enterovirus NOT DETECTED NOT DETECTED Final   Influenza A NOT DETECTED NOT DETECTED Final   Influenza B NOT DETECTED NOT DETECTED Final   Parainfluenza Virus 1 NOT DETECTED NOT DETECTED  Final   Parainfluenza Virus 2 NOT DETECTED NOT DETECTED Final   Parainfluenza Virus 3 NOT DETECTED NOT DETECTED Final   Parainfluenza Virus 4 NOT DETECTED NOT DETECTED Final   Respiratory Syncytial Virus NOT DETECTED NOT DETECTED Final   Bordetella pertussis NOT DETECTED NOT DETECTED Final   Bordetella Parapertussis NOT DETECTED NOT DETECTED Final   Chlamydophila pneumoniae NOT DETECTED NOT DETECTED Final   Mycoplasma pneumoniae NOT DETECTED NOT DETECTED Final    Comment: Performed at [facility], [address], [place], [place] [address]     Radiological Exams on Admission:   Assessment/Plan Active Problems:   COPD exacerbation (HCC)   Interstitial pulmonary disease (HCC)   Chronic diastolic CHF (congestive heart failure) (HCC)   Atrial fibrillation, chronic (HCC)   Essential hypertension   CAD (coronary artery disease)   Hyperlipidemia   Stage 3b chronic kidney disease (HCC)   Rheumatoid arthritis (HCC)   OSA (obstructive sleep apnea)   Bipolar disorder, current episode mixed, unspecified (HCC)   Depression with anxiety   Obesity (BMI 30-39.9)   Assessment and Plan:   COPD exacerbation [facility]): Chest x-ray negative for infiltration.  Patient is on home level 2-3 L oxygen  with 99% of saturation.  She has productive cough, shortness breath and wheezing, clinically consistent with CHF exacerbation.  - will place to tele bed for observation -Bronchodilators and Singulair  -Solu-Medrol  80 mg daily (patient received 60 mg of prednisone  in ED) -Z pak (patient received 1 dose of IV azithromycin  and Rocephin  in ED) -Mucinex  for cough  -Incentive spirometry -sputum culture -Nasal cannula oxygen  as needed to maintain O2 saturation 93% or greater -check RVP  Interstitial pulmonary disease (HCC) -Bronchodilators as above  Chronic diastolic CHF (congestive heart failure) (HCC): 2D echo on 03/30/2022 showed EF of 55 to 60% with grade 2 diastolic dysfunction.  Patient does not  have leg edema JVD.  BNP is 195.  CHF seem to be compensated. -Continue home home torsemide   Atrial fibrillation, chronic (HCC): Heart rate 88 -Nebivolol  and Eliquis   Essential hypertension -IV hydralazine  as needed -Patient torsemide  and nebivolol  -Amlodipine   CAD (coronary artery disease) -Switched lovastatin  to pravastatin  in hospital  Hyperlipidemia -Pravastatin   Stage 3b chronic kidney disease (HCC): Renal function close to baseline.  Baseline creatinine 1.0-1.3 recently.  Her creatinine is 1.33, BUN 29, GFR 40 -Follow-up with BMP  Rheumatoid arthritis [facility]): Patient is getting Enbrel injection -Continue home leflunomide   OSA (obstructive sleep apnea) -CPAP  Bipolar disorder, current episode mixed, unspecified (HCC) and depression with anxiety -Continue home medications  Obesity (BMI 30-39.9): Body weight 79.4 kg, BMI 34.76 -Encourage losing weight -Exercise and healthy diet       DVT ppx: on Eliquis   Code Status: DNR per pt  Family Communication: not done, no family member is at bed side.  Disposition Plan:  Anticipate discharge back to previous environment  Consults called:  none  Admission status and Level of care: Telemetry Medical:  for obs     Dispo: The patient is from: Home              Anticipated d/c is to: Home              Anticipated d/c date is: 1 day              Patient currently is not medically stable to d/c.    Severity of Illness:  The appropriate patient status for this patient is INPATIENT. Inpatient status is judged to be reasonable and necessary in order to provide the required intensity of service to ensure the patient's safety. The patient's presenting symptoms, physical exam findings, and initial radiographic and laboratory data in the context of their chronic comorbidities is felt to place them at high risk for further clinical deterioration. Furthermore, it is not anticipated that the patient will be medically stable for  discharge from the hospital within 2 midnights of admission.   * I certify that at the point of admission it is my clinical judgment that the patient will require inpatient hospital care spanning beyond 2 midnights from the point of admission due to high intensity of service, high risk for further deterioration and high frequency of surveillance required.*       Date of Service 02/12/2023    Caleb Exon [facility]   If 7PM-7AM, please contact night-coverage [email or web address] 02/12/2023, 1:35 AM

## 2023-02-12 DIAGNOSIS — J441 Chronic obstructive pulmonary disease with (acute) exacerbation: Secondary | ICD-10-CM | POA: Diagnosis not present

## 2023-02-12 LAB — CBC
HCT: 32.9 % — ABNORMAL LOW (ref 36.0–46.0)
Hemoglobin: 10.4 g/dL — ABNORMAL LOW (ref 12.0–15.0)
MCH: 27.7 pg (ref 26.0–34.0)
MCHC: 31.6 g/dL (ref 30.0–36.0)
MCV: 87.5 fL (ref 80.0–100.0)
Platelets: 265 10*3/uL (ref 150–400)
RBC: 3.76 MIL/uL — ABNORMAL LOW (ref 3.87–5.11)
RDW: 15.3 % (ref 11.5–15.5)
WBC: 16.4 10*3/uL — ABNORMAL HIGH (ref 4.0–10.5)
nRBC: 0.1 % (ref 0.0–0.2)

## 2023-02-12 LAB — RESPIRATORY PANEL BY PCR

## 2023-02-12 LAB — BASIC METABOLIC PANEL
Anion gap: 12 (ref 5–15)
BUN: 29 mg/dL — ABNORMAL HIGH (ref 8–23)
CO2: 24 mmol/L (ref 22–32)
Calcium: 8.1 mg/dL — ABNORMAL LOW (ref 8.9–10.3)
Chloride: 104 mmol/L (ref 98–111)
Creatinine, Ser: 1.35 mg/dL — ABNORMAL HIGH (ref 0.44–1.00)
GFR, Estimated: 40 mL/min — ABNORMAL LOW (ref >60)
Glucose, Bld: 174 mg/dL — ABNORMAL HIGH (ref 70–99)
Potassium: 4.9 mmol/L (ref 3.5–5.1)
Sodium: 140 mmol/L (ref 135–145)

## 2023-02-12 LAB — PROCALCITONIN: Procalcitonin: <0.1 ng/mL

## 2023-02-12 MED ORDER — AZITHROMYCIN 500 MG IV SOLR
500.0000 mg | INTRAVENOUS | Status: DC
  Administered 2023-02-12: 500 mg via INTRAVENOUS
  Filled 2023-02-12: qty 5

## 2023-02-12 MED ORDER — ARFORMOTEROL TARTRATE 15 MCG/2ML IN NEBU
15.0000 ug | INHALATION_SOLUTION | Freq: Two times a day (BID) | RESPIRATORY_TRACT | Status: DC
  Administered 2023-02-12 – 2023-02-16 (×8): 15 ug via RESPIRATORY_TRACT
  Filled 2023-02-12 (×10): qty 2

## 2023-02-12 MED ORDER — MELATONIN 5 MG PO TABS
5.0000 mg | ORAL_TABLET | Freq: Every evening | ORAL | Status: DC | PRN
  Administered 2023-02-13: 5 mg via ORAL
  Filled 2023-02-12: qty 1

## 2023-02-12 MED ORDER — FAMOTIDINE 20 MG PO TABS
20.0000 mg | ORAL_TABLET | Freq: Two times a day (BID) | ORAL | Status: DC
  Administered 2023-02-12 – 2023-02-16 (×8): 20 mg via ORAL
  Filled 2023-02-12 (×8): qty 1

## 2023-02-12 MED ORDER — TRAZODONE HCL 50 MG PO TABS
50.0000 mg | ORAL_TABLET | Freq: Every day | ORAL | Status: DC
  Administered 2023-02-12 – 2023-02-15 (×4): 50 mg via ORAL
  Filled 2023-02-12 (×4): qty 1

## 2023-02-12 MED ORDER — ALUM & MAG HYDROXIDE-SIMETH 200-200-20 MG/5ML PO SUSP
30.0000 mL | ORAL | Status: DC | PRN
  Administered 2023-02-12 – 2023-02-13 (×3): 30 mL via ORAL
  Filled 2023-02-12 (×3): qty 30

## 2023-02-12 MED ORDER — LEFLUNOMIDE 10 MG PO TABS
20.0000 mg | ORAL_TABLET | Freq: Every day | ORAL | Status: DC
  Administered 2023-02-12 – 2023-02-16 (×5): 20 mg via ORAL
  Filled 2023-02-12 (×5): qty 2

## 2023-02-12 MED ORDER — SODIUM CHLORIDE 0.9 % IV SOLN
1.0000 g | INTRAVENOUS | Status: DC
  Administered 2023-02-12: 1 g via INTRAVENOUS
  Filled 2023-02-12 (×2): qty 10

## 2023-02-12 NOTE — Care Management Obs Status (Signed)
 MEDICARE OBSERVATION STATUS NOTIFICATION   Patient Details  Name: NOELLE SEASE MRN: [id number] Date of Birth: 01/30/1941   Medicare Observation Status Notification Given:  Orland Dec, CMA 02/12/2023, 3:08 PM

## 2023-02-12 NOTE — Plan of Care (Signed)

## 2023-02-12 NOTE — Progress Notes (Signed)
 PROGRESS NOTE    Kerri Carter  FMW:[id number] DOB: 06-Apr-1940 DOA: 02/11/2023 PCP: Hope Merle, MD    Brief Narrative:  83 y.o. female with medical history significant of ILD and COPD on 2-3 L oxygen , hypertension, hyperlipidemia, PVD, CAD, diastolic CHF, depression with anxiety, bipolar, CKD-3B, A-fib on Eliquis , OSA on CPAP, obesity, who presents with shortness of breath.   Patient states that she has progressively worsening shortness breath for more than 3 days.  She has cough with yellow-colored sputum production.  No chest pain, fever or chills.  No nausea, vomiting, diarrhea or abdominal pain.  No symptoms of UTI.   Assessment & Plan:   Active Problems:   COPD exacerbation (HCC)   Interstitial pulmonary disease (HCC)   Chronic diastolic CHF (congestive heart failure) (HCC)   Atrial fibrillation, chronic (HCC)   Essential hypertension   CAD (coronary artery disease)   Hyperlipidemia   Stage 3b chronic kidney disease (HCC)   Rheumatoid arthritis (HCC)   OSA (obstructive sleep apnea)   Bipolar disorder, current episode mixed, unspecified (HCC)   Depression with anxiety   Obesity (BMI 30-39.9)  COPD exacerbation (HCC) Chronic hypoxic respiratory failure : Chest x-ray negative for infiltration.  Patient is on home level 2-3 L oxygen  with 99% of saturation.  She has productive cough, shortness breath and wheezing, clinically consistent with COPD exacerbation.  Negative RVP and COVID test Plan: Continue IV Solu-Medrol  80 mg daily Bronchodilators Singulair  Empiric Rocephin  and azithromycin  Check sputum culture Mucolytic's and antitussives as needed Encourage incentive spirometry use   Interstitial pulmonary disease (HCC) See above management   Chronic diastolic CHF (congestive heart failure) (HCC):  2D echo on 03/30/2022 showed EF of 55 to 60% with grade 2 diastolic dysfunction.  Patient does not have leg edema JVD.  BNP is 195.  CHF seem to be compensated. -Continue  home home torsemide    Atrial fibrillation, chronic (HCC): Heart rate 88 -Nebivolol  and Eliquis    Essential hypertension -IV hydralazine  as needed -Patient torsemide  and nebivolol  -Amlodipine    CAD (coronary artery disease) -Switched lovastatin  to pravastatin  in hospital   Hyperlipidemia -Pravastatin    Stage 3b chronic kidney disease (HCC):  Renal function close to baseline.  Baseline creatinine 1.0-1.3 recently.  Her creatinine is 1.33, BUN 29, GFR 40 -Follow-up with BMP   Rheumatoid arthritis [facility]): Patient is getting Enbrel injection -Continue home leflunomide    OSA (obstructive sleep apnea) -CPAP nightly   Bipolar disorder, current episode mixed, unspecified (HCC) and depression with anxiety -Continue home medications   Obesity (BMI 30-39.9): Body weight 79.4 kg, BMI 34.76 -Encourage losing weight -Exercise and healthy diet   DVT prophylaxis: Apixaban  Code Status: DNR limited Family Communication: None Disposition Plan: Status is: Observation The patient will require care spanning > 2 midnights and should be moved to inpatient because: Patient still with significant wheezing and dyspnea requiring use of intravenous steroids, empiric antibiotics, and multimodal bronchodilator therapy.   Level of care: Med-Surg  Consultants:  None  Procedures:  None  Antimicrobials: Ceftriaxone  Azithromycin    Subjective: Seen and examined.  Resting in bed.  Reports improved respiratory status but still significant dyspnea associated with cough productive of sputum and audible wheeze.  Objective: Vitals:   02/12/23 1100 02/12/23 1200 02/12/23 1300 02/12/23 1400  BP: (!) 126/91 130/74 121/67 128/71  Pulse: 91 88 83 83  Resp: 18 13 13 13   Temp:      TempSrc:      SpO2: 97% 94% 96% 95%  Weight:  Height:        Intake/Output Summary (Last 24 hours) at 02/12/2023 1657 Last data filed at 02/11/2023 2306 Gross per 24 hour  Intake 350 ml  Output --  Net 350 ml    Filed Weights   02/11/23 1838  Weight: 79.4 kg    Examination:  General exam: NAD.  Appears chronically ill Respiratory system: Diffuse wheezing in all lung fields.  Bibasilar crackles.  Normal work of breathing.  3 L Cardiovascular system: S1 & S2 heard, RRR. No JVD, murmurs, rubs, gallops or clicks. No pedal edema. Gastrointestinal system: Soft, NT/ND, normal bowel sounds Central nervous system: Alert and oriented. No focal neurological deficits. Extremities: Symmetric 5 x 5 power. Skin: No rashes, lesions or ulcers Psychiatry: Judgement and insight appear normal. Mood & affect appropriate.     Data Reviewed: I have personally reviewed following labs and imaging studies  CBC: Recent Labs  Lab 02/11/23 1350 02/12/23 0531  WBC 15.9* 16.4*  NEUTROABS 10.3*  --   HGB 11.2* 10.4*  HCT 35.6* 32.9*  MCV 89.0 87.5  PLT 281 265   Basic Metabolic Panel: Recent Labs  Lab 02/11/23 1350 02/12/23 0531  NA 138 140  K 4.0 4.9  CL 103 104  CO2 21* 24  GLUCOSE 160* 174*  BUN 29* 29*  CREATININE 1.33* 1.35*  CALCIUM  8.5* 8.1*   GFR: Estimated Creatinine Clearance: 30.6 mL/min (A) (by C-G formula based on SCr of 1.35 mg/dL (H)). Liver Function Tests: Recent Labs  Lab 02/11/23 1350  AST 20  ALT 17  ALKPHOS 82  BILITOT 0.6  PROT 7.4  ALBUMIN  3.4*   No results for input(s): LIPASE, AMYLASE in the last 168 hours. No results for input(s): AMMONIA in the last 168 hours. Coagulation Profile: No results for input(s): INR, PROTIME in the last 168 hours. Cardiac Enzymes: No results for input(s): CKTOTAL, CKMB, CKMBINDEX, TROPONINI in the last 168 hours. BNP (last 3 results) No results for input(s): PROBNP in the last 8760 hours. HbA1C: No results for input(s): HGBA1C in the last 72 hours. CBG: No results for input(s): GLUCAP in the last 168 hours. Lipid Profile: No results for input(s): CHOL, HDL, LDLCALC, TRIG, CHOLHDL, LDLDIRECT in  the last 72 hours. Thyroid  Function Tests: No results for input(s): TSH, T4TOTAL, FREET4, T3FREE, THYROIDAB in the last 72 hours. Anemia Panel: No results for input(s): VITAMINB12, FOLATE, FERRITIN, TIBC, IRON , RETICCTPCT in the last 72 hours. Sepsis Labs: Recent Labs  Lab 02/12/23 0531  PROCALCITON <0.10    Recent Results (from the past 240 hours)  Resp panel by RT-PCR (RSV, Flu A&B, Covid) Anterior Nasal Swab     Status: None   Collection Time: 02/11/23  6:20 PM   Specimen: Anterior Nasal Swab  Result Value Ref Range Status   SARS Coronavirus 2 by RT PCR NEGATIVE NEGATIVE Final    Comment: (NOTE) SARS-CoV-2 target nucleic acids are NOT DETECTED.  The SARS-CoV-2 RNA is generally detectable in upper respiratory specimens during the acute phase of infection. The lowest concentration of SARS-CoV-2 viral copies this assay can detect is 138 copies/mL. A negative result does not preclude SARS-Cov-2 infection and should not be used as the sole basis for treatment or other patient management decisions. A negative result may occur with  improper specimen collection/handling, submission of specimen other than nasopharyngeal swab, presence of viral mutation(s) within the areas targeted by this assay, and inadequate number of viral copies(<138 copies/mL). A negative result must be combined with clinical observations, patient  history, and epidemiological information. The expected result is Negative.  Fact Sheet for Patients:  bloggercourse.com  Fact Sheet for Healthcare Providers:  seriousbroker.it  This test is no t yet approved or cleared by the [place]  FDA and  has been authorized for detection and/or diagnosis of SARS-CoV-2 by FDA under an Emergency Use Authorization (EUA). This EUA will remain  in effect (meaning this test can be used) for the duration of the COVID-19 declaration under Section 564(b)(1)  of the Act, 21 U.S.C.section 360bbb-3(b)(1), unless the authorization is terminated  or revoked sooner.       Influenza A by PCR NEGATIVE NEGATIVE Final   Influenza B by PCR NEGATIVE NEGATIVE Final    Comment: (NOTE) The Xpert Xpress SARS-CoV-2/FLU/RSV plus assay is intended as an aid in the diagnosis of influenza from Nasopharyngeal swab specimens and should not be used as a sole basis for treatment. Nasal washings and aspirates are unacceptable for Xpert Xpress SARS-CoV-2/FLU/RSV testing.  Fact Sheet for Patients: bloggercourse.com  Fact Sheet for Healthcare Providers: seriousbroker.it  This test is not yet approved or cleared by the [place]  FDA and has been authorized for detection and/or diagnosis of SARS-CoV-2 by FDA under an Emergency Use Authorization (EUA). This EUA will remain in effect (meaning this test can be used) for the duration of the COVID-19 declaration under Section 564(b)(1) of the Act, 21 U.S.C. section 360bbb-3(b)(1), unless the authorization is terminated or revoked.     Resp Syncytial Virus by PCR NEGATIVE NEGATIVE Final    Comment: (NOTE) Fact Sheet for Patients: bloggercourse.com  Fact Sheet for Healthcare Providers: seriousbroker.it  This test is not yet approved or cleared by the [place]  FDA and has been authorized for detection and/or diagnosis of SARS-CoV-2 by FDA under an Emergency Use Authorization (EUA). This EUA will remain in effect (meaning this test can be used) for the duration of the COVID-19 declaration under Section 564(b)(1) of the Act, 21 U.S.C. section 360bbb-3(b)(1), unless the authorization is terminated or revoked.  Performed at [facility], [address] [place], [place] [address]   Respiratory (~20 pathogens) panel by PCR     Status: None   Collection Time: 02/11/23  8:38 PM   Specimen:  Nasopharyngeal Swab; Respiratory  Result Value Ref Range Status   Adenovirus NOT DETECTED NOT DETECTED Final   Coronavirus 229E NOT DETECTED NOT DETECTED Final    Comment: (NOTE) The Coronavirus on the Respiratory Panel, DOES NOT test for the novel  Coronavirus (2019 nCoV)    Coronavirus HKU1 NOT DETECTED NOT DETECTED Final   Coronavirus NL63 NOT DETECTED NOT DETECTED Final   Coronavirus OC43 NOT DETECTED NOT DETECTED Final   Metapneumovirus NOT DETECTED NOT DETECTED Final   Rhinovirus / Enterovirus NOT DETECTED NOT DETECTED Final   Influenza A NOT DETECTED NOT DETECTED Final   Influenza B NOT DETECTED NOT DETECTED Final   Parainfluenza Virus 1 NOT DETECTED NOT DETECTED Final   Parainfluenza Virus 2 NOT DETECTED NOT DETECTED Final   Parainfluenza Virus 3 NOT DETECTED NOT DETECTED Final   Parainfluenza Virus 4 NOT DETECTED NOT DETECTED Final   Respiratory Syncytial Virus NOT DETECTED NOT DETECTED Final   Bordetella pertussis NOT DETECTED NOT DETECTED Final   Bordetella Parapertussis NOT DETECTED NOT DETECTED Final   Chlamydophila pneumoniae NOT DETECTED NOT DETECTED Final   Mycoplasma pneumoniae NOT DETECTED NOT DETECTED Final    Comment: Performed at [facility], [address], [place], [place] [address]  Culture, blood (  Routine X 2) w Reflex to ID Panel     Status: None (Preliminary result)   Collection Time: 02/11/23  9:46 PM   Specimen: BLOOD  Result Value Ref Range Status   Specimen Description BLOOD BLOOD RIGHT HAND  Final   Special Requests   Final    BOTTLES DRAWN AEROBIC AND ANAEROBIC Blood Culture results may not be optimal due to an excessive volume of blood received in culture bottles   Culture   Final    NO GROWTH < 12 HOURS Performed at [facility], [address], [place], [place] [address]    Report Status PENDING  Incomplete  Culture, blood (Routine X 2) w Reflex to ID Panel     Status: None (Preliminary result)   Collection Time: 02/11/23   9:46 PM   Specimen: BLOOD  Result Value Ref Range Status   Specimen Description BLOOD BLOOD RIGHT HAND  Final   Special Requests   Final    BOTTLES DRAWN AEROBIC AND ANAEROBIC Blood Culture results may not be optimal due to an excessive volume of blood received in culture bottles   Culture   Final    NO GROWTH < 12 HOURS Performed at [facility], [address], [place], [place] [address]    Report Status PENDING  Incomplete         Radiology Studies: DG Chest 2 View Result Date: 02/11/2023 CLINICAL DATA:  Shortness of breath. EXAM: CHEST - 2 VIEW COMPARISON:  08/12/2022 FINDINGS: The heart size and mediastinal contours are within normal limits. Scarring is again seen in the right upper lobe with several old right rib fracture deformities. The lungs are otherwise clear. Bilateral shoulder prostheses again noted. IMPRESSION: No active cardiopulmonary disease. Stable right upper lobe scarring and old right rib fracture deformities. Electronically Signed   By: Norleen DELENA Kil M.D.   On: 02/11/2023 15:01        Scheduled Meds:  amLODipine   5 mg Oral BID   apixaban   5 mg Oral BID   arformoterol   15 mcg Nebulization BID   busPIRone   5 mg Oral BID   escitalopram   10 mg Oral Daily   famotidine   20 mg Oral BID   gabapentin   300 mg Oral Daily   And   gabapentin   600 mg Oral QHS   ipratropium-albuterol   3 mL Nebulization Q4H   lamoTRIgine   100 mg Oral BID   leflunomide   20 mg Oral Daily   loratadine   10 mg Oral QHS   methocarbamol   500 mg Oral TID   methylPREDNISolone  (SOLU-MEDROL ) injection  80 mg Intravenous Daily   mirabegron  ER  25 mg Oral Daily   montelukast   10 mg Oral Daily   multivitamin  1 tablet Oral Daily   nebivolol   5 mg Oral BID   pantoprazole   40 mg Oral BID AC   pravastatin   20 mg Oral q1800   QUEtiapine   50 mg Oral QHS   sucralfate   1 g Oral BID   torsemide   20 mg Oral Daily   Continuous Infusions:  azithromycin  (ZITHROMAX ) 500 mg in sodium chloride   0.9 % 250 mL IVPB     cefTRIAXone  (ROCEPHIN )  IV       LOS: 0 days      Calvin KATHEE Robson, MD [facility]   If 7PM-7AM, please contact night-coverage  02/12/2023, 4:57 PM

## 2023-02-13 DIAGNOSIS — Z66 Do not resuscitate: Secondary | ICD-10-CM | POA: Diagnosis present

## 2023-02-13 DIAGNOSIS — Z96643 Presence of artificial hip joint, bilateral: Secondary | ICD-10-CM | POA: Diagnosis present

## 2023-02-13 DIAGNOSIS — Z7901 Long term (current) use of anticoagulants: Secondary | ICD-10-CM | POA: Diagnosis not present

## 2023-02-13 DIAGNOSIS — M069 Rheumatoid arthritis, unspecified: Secondary | ICD-10-CM | POA: Diagnosis present

## 2023-02-13 DIAGNOSIS — J441 Chronic obstructive pulmonary disease with (acute) exacerbation: Secondary | ICD-10-CM | POA: Diagnosis present

## 2023-02-13 DIAGNOSIS — N1832 Chronic kidney disease, stage 3b: Secondary | ICD-10-CM | POA: Diagnosis present

## 2023-02-13 DIAGNOSIS — I2510 Atherosclerotic heart disease of native coronary artery without angina pectoris: Secondary | ICD-10-CM | POA: Diagnosis present

## 2023-02-13 DIAGNOSIS — F419 Anxiety disorder, unspecified: Secondary | ICD-10-CM | POA: Diagnosis present

## 2023-02-13 DIAGNOSIS — J9611 Chronic respiratory failure with hypoxia: Secondary | ICD-10-CM | POA: Diagnosis present

## 2023-02-13 DIAGNOSIS — M810 Age-related osteoporosis without current pathological fracture: Secondary | ICD-10-CM | POA: Diagnosis present

## 2023-02-13 DIAGNOSIS — I130 Hypertensive heart and chronic kidney disease with heart failure and stage 1 through stage 4 chronic kidney disease, or unspecified chronic kidney disease: Secondary | ICD-10-CM | POA: Diagnosis present

## 2023-02-13 DIAGNOSIS — M48061 Spinal stenosis, lumbar region without neurogenic claudication: Secondary | ICD-10-CM | POA: Diagnosis present

## 2023-02-13 DIAGNOSIS — F3160 Bipolar disorder, current episode mixed, unspecified: Secondary | ICD-10-CM | POA: Diagnosis present

## 2023-02-13 DIAGNOSIS — H3530 Unspecified macular degeneration: Secondary | ICD-10-CM | POA: Diagnosis present

## 2023-02-13 DIAGNOSIS — I739 Peripheral vascular disease, unspecified: Secondary | ICD-10-CM | POA: Diagnosis present

## 2023-02-13 DIAGNOSIS — I482 Chronic atrial fibrillation, unspecified: Secondary | ICD-10-CM | POA: Diagnosis present

## 2023-02-13 DIAGNOSIS — K219 Gastro-esophageal reflux disease without esophagitis: Secondary | ICD-10-CM | POA: Diagnosis present

## 2023-02-13 DIAGNOSIS — E669 Obesity, unspecified: Secondary | ICD-10-CM | POA: Diagnosis present

## 2023-02-13 DIAGNOSIS — G4733 Obstructive sleep apnea (adult) (pediatric): Secondary | ICD-10-CM | POA: Diagnosis present

## 2023-02-13 DIAGNOSIS — Z96651 Presence of right artificial knee joint: Secondary | ICD-10-CM | POA: Diagnosis present

## 2023-02-13 DIAGNOSIS — I5042 Chronic combined systolic (congestive) and diastolic (congestive) heart failure: Secondary | ICD-10-CM | POA: Diagnosis present

## 2023-02-13 DIAGNOSIS — Z1152 Encounter for screening for COVID-19: Secondary | ICD-10-CM | POA: Diagnosis not present

## 2023-02-13 DIAGNOSIS — D631 Anemia in chronic kidney disease: Secondary | ICD-10-CM | POA: Diagnosis present

## 2023-02-13 DIAGNOSIS — E785 Hyperlipidemia, unspecified: Secondary | ICD-10-CM | POA: Diagnosis present

## 2023-02-13 MED ORDER — DOCUSATE SODIUM 100 MG PO CAPS
100.0000 mg | ORAL_CAPSULE | Freq: Two times a day (BID) | ORAL | Status: DC | PRN
  Administered 2023-02-13 – 2023-02-15 (×2): 100 mg via ORAL
  Filled 2023-02-13 (×2): qty 1

## 2023-02-13 MED ORDER — AMOXICILLIN-POT CLAVULANATE 875-125 MG PO TABS
1.0000 | ORAL_TABLET | Freq: Two times a day (BID) | ORAL | Status: AC
  Administered 2023-02-13 – 2023-02-16 (×6): 1 via ORAL
  Filled 2023-02-13 (×6): qty 1

## 2023-02-13 MED ORDER — AZITHROMYCIN 500 MG PO TABS
500.0000 mg | ORAL_TABLET | Freq: Every day | ORAL | Status: AC
  Administered 2023-02-13 – 2023-02-15 (×3): 500 mg via ORAL
  Filled 2023-02-13 (×3): qty 1

## 2023-02-13 MED ORDER — IPRATROPIUM-ALBUTEROL 0.5-2.5 (3) MG/3ML IN SOLN
3.0000 mL | Freq: Four times a day (QID) | RESPIRATORY_TRACT | Status: DC
  Administered 2023-02-13 – 2023-02-14 (×5): 3 mL via RESPIRATORY_TRACT
  Filled 2023-02-13 (×5): qty 3

## 2023-02-13 MED ORDER — NYSTATIN 100000 UNIT/ML MT SUSP
5.0000 mL | Freq: Four times a day (QID) | OROMUCOSAL | Status: DC
  Administered 2023-02-13 – 2023-02-16 (×10): 500000 [IU] via OROMUCOSAL
  Filled 2023-02-13 (×10): qty 5

## 2023-02-13 MED ORDER — IPRATROPIUM-ALBUTEROL 0.5-2.5 (3) MG/3ML IN SOLN
3.0000 mL | Freq: Three times a day (TID) | RESPIRATORY_TRACT | Status: DC
Start: 2023-02-13 — End: 2023-02-13
  Administered 2023-02-13: 3 mL via RESPIRATORY_TRACT
  Filled 2023-02-13: qty 3

## 2023-02-13 NOTE — Progress Notes (Signed)
 PROGRESS NOTE    Kerri Carter  FMW:[id number] DOB: 1940/06/22 DOA: 02/11/2023 PCP: Hope Merle, MD    Brief Narrative:  83 y.o. female with medical history significant of ILD and COPD on 2-3 L oxygen , hypertension, hyperlipidemia, PVD, CAD, diastolic CHF, depression with anxiety, bipolar, CKD-3B, A-fib on Eliquis , OSA on CPAP, obesity, who presents with shortness of breath.   Patient states that she has progressively worsening shortness breath for more than 3 days.  She has cough with yellow-colored sputum production.  No chest pain, fever or chills.  No nausea, vomiting, diarrhea or abdominal pain.  No symptoms of UTI.   Assessment & Plan:   Active Problems:   COPD exacerbation (HCC)   Interstitial pulmonary disease (HCC)   Chronic diastolic CHF (congestive heart failure) (HCC)   Atrial fibrillation, chronic (HCC)   Essential hypertension   CAD (coronary artery disease)   Hyperlipidemia   Stage 3b chronic kidney disease (HCC)   Rheumatoid arthritis (HCC)   OSA (obstructive sleep apnea)   Bipolar disorder, current episode mixed, unspecified (HCC)   Depression with anxiety   Obesity (BMI 30-39.9)  COPD exacerbation (HCC) Chronic hypoxic respiratory failure Chest x-ray negative for acute infiltrate however patient continues to have cough productive of yellow sputum.  She is on 3 L nasal cannula.  COVID-negative.  Still with significant wheeze and dyspnea.   Plan: Continue IV Solu-Medrol  80 mg daily Escalate bronchodilator regimen DuoNebs every 6 hours Brovana  twice daily Follow-up sputum culture Continue Singulair  If no response can consider MetaNeb Encourage I-S and flutter use Mucolytic's and antitussives as needed Empiric antimicrobial therapy  Interstitial pulmonary disease (HCC) See above for acute management   Chronic diastolic CHF (congestive heart failure) (HCC):  2D echo on 03/30/2022 showed EF of 55 to 60% with grade 2 diastolic dysfunction.  Patient does not  have leg edema JVD.  BNP is 195.  CHF seem to be compensated. -Continue home Demadex  20 mg daily   Atrial fibrillation, chronic (HCC): Heart rate 88 Continue home Bystolic  and Eliquis    Essential hypertension -IV hydralazine  as needed -Patient torsemide  and nebivolol  -Amlodipine    CAD (coronary artery disease) Continue pravastatin    Hyperlipidemia -Pravastatin    Stage 3b chronic kidney disease (HCC):  Renal function close to baseline.  Baseline creatinine 1.0-1.3 recently.  Her creatinine is 1.33, BUN 29, GFR 40 -Follow-up with BMP   Rheumatoid arthritis [facility]): Patient is getting Enbrel injection -Continue home leflunomide    OSA (obstructive sleep apnea) -CPAP nightly   Bipolar disorder, current episode mixed, unspecified (HCC) and depression with anxiety -Continue home medications   Obesity (BMI 30-39.9): Body weight 79.4 kg, BMI 34.76 -Encourage losing weight -Exercise and healthy diet   DVT prophylaxis: Apixaban  Code Status: DNR limited Family Communication: None Disposition Plan: Status is: Observation The patient will require care spanning > 2 midnights and should be moved to inpatient because: Symptomatic COPD exacerbation with wheezing and conversational dyspnea requiring intravenous steroids and aggressive bronchodilator therapy     Level of care: Med-Surg  Consultants:  None  Procedures:  None  Antimicrobials: Azithromycin  Augmentin    Subjective: Seen and examined.  Resting in bed.  Continues to endorse dyspnea with minimal exertion  Objective: Vitals:   02/13/23 0522 02/13/23 0811 02/13/23 0818 02/13/23 1319  BP: 129/67 138/68    Pulse: 84 93    Resp: 18 19    Temp: (!) 97.4 F (36.3 C) 97.7 F (36.5 C)    TempSrc:      SpO2:  99% 98% 97% 97%  Weight:      Height:        Intake/Output Summary (Last 24 hours) at 02/13/2023 1329 Last data filed at 02/12/2023 2256 Gross per 24 hour  Intake 0 ml  Output --  Net 0 ml   Filed Weights    02/11/23 1838  Weight: 79.4 kg    Examination:  General exam: NAD.  Appears fatigued Respiratory system: Diffuse wheeze in all lung fields.  Bibasilar crackles.  +Conversational dyspnea.  3 L Cardiovascular system: S1-S2, regular rate, irregular rhythm, no murmurs, no pedal edema Gastrointestinal system: Soft, NT/ND, normal bowel sounds Central nervous system: Alert and oriented. No focal neurological deficits. Extremities: Symmetric 5 x 5 power. Skin: No rashes, lesions or ulcers Psychiatry: Judgement and insight appear normal. Mood & affect appropriate.     Data Reviewed: I have personally reviewed following labs and imaging studies  CBC: Recent Labs  Lab 02/11/23 1350 02/12/23 0531  WBC 15.9* 16.4*  NEUTROABS 10.3*  --   HGB 11.2* 10.4*  HCT 35.6* 32.9*  MCV 89.0 87.5  PLT 281 265   Basic Metabolic Panel: Recent Labs  Lab 02/11/23 1350 02/12/23 0531  NA 138 140  K 4.0 4.9  CL 103 104  CO2 21* 24  GLUCOSE 160* 174*  BUN 29* 29*  CREATININE 1.33* 1.35*  CALCIUM  8.5* 8.1*   GFR: Estimated Creatinine Clearance: 30.6 mL/min (A) (by C-G formula based on SCr of 1.35 mg/dL (H)). Liver Function Tests: Recent Labs  Lab 02/11/23 1350  AST 20  ALT 17  ALKPHOS 82  BILITOT 0.6  PROT 7.4  ALBUMIN  3.4*   No results for input(s): LIPASE, AMYLASE in the last 168 hours. No results for input(s): AMMONIA in the last 168 hours. Coagulation Profile: No results for input(s): INR, PROTIME in the last 168 hours. Cardiac Enzymes: No results for input(s): CKTOTAL, CKMB, CKMBINDEX, TROPONINI in the last 168 hours. BNP (last 3 results) No results for input(s): PROBNP in the last 8760 hours. HbA1C: No results for input(s): HGBA1C in the last 72 hours. CBG: No results for input(s): GLUCAP in the last 168 hours. Lipid Profile: No results for input(s): CHOL, HDL, LDLCALC, TRIG, CHOLHDL, LDLDIRECT in the last 72 hours. Thyroid  Function  Tests: No results for input(s): TSH, T4TOTAL, FREET4, T3FREE, THYROIDAB in the last 72 hours. Anemia Panel: No results for input(s): VITAMINB12, FOLATE, FERRITIN, TIBC, IRON , RETICCTPCT in the last 72 hours. Sepsis Labs: Recent Labs  Lab 02/12/23 0531  PROCALCITON <0.10    Recent Results (from the past 240 hours)  Resp panel by RT-PCR (RSV, Flu A&B, Covid) Anterior Nasal Swab     Status: None   Collection Time: 02/11/23  6:20 PM   Specimen: Anterior Nasal Swab  Result Value Ref Range Status   SARS Coronavirus 2 by RT PCR NEGATIVE NEGATIVE Final    Comment: (NOTE) SARS-CoV-2 target nucleic acids are NOT DETECTED.  The SARS-CoV-2 RNA is generally detectable in upper respiratory specimens during the acute phase of infection. The lowest concentration of SARS-CoV-2 viral copies this assay can detect is 138 copies/mL. A negative result does not preclude SARS-Cov-2 infection and should not be used as the sole basis for treatment or other patient management decisions. A negative result may occur with  improper specimen collection/handling, submission of specimen other than nasopharyngeal swab, presence of viral mutation(s) within the areas targeted by this assay, and inadequate number of viral copies(<138 copies/mL). A negative result must be combined with  clinical observations, patient history, and epidemiological information. The expected result is Negative.  Fact Sheet for Patients:  bloggercourse.com  Fact Sheet for Healthcare Providers:  seriousbroker.it  This test is no t yet approved or cleared by the [place]  FDA and  has been authorized for detection and/or diagnosis of SARS-CoV-2 by FDA under an Emergency Use Authorization (EUA). This EUA will remain  in effect (meaning this test can be used) for the duration of the COVID-19 declaration under Section 564(b)(1) of the Act, 21 U.S.C.section  360bbb-3(b)(1), unless the authorization is terminated  or revoked sooner.       Influenza A by PCR NEGATIVE NEGATIVE Final   Influenza B by PCR NEGATIVE NEGATIVE Final    Comment: (NOTE) The Xpert Xpress SARS-CoV-2/FLU/RSV plus assay is intended as an aid in the diagnosis of influenza from Nasopharyngeal swab specimens and should not be used as a sole basis for treatment. Nasal washings and aspirates are unacceptable for Xpert Xpress SARS-CoV-2/FLU/RSV testing.  Fact Sheet for Patients: bloggercourse.com  Fact Sheet for Healthcare Providers: seriousbroker.it  This test is not yet approved or cleared by the [place]  FDA and has been authorized for detection and/or diagnosis of SARS-CoV-2 by FDA under an Emergency Use Authorization (EUA). This EUA will remain in effect (meaning this test can be used) for the duration of the COVID-19 declaration under Section 564(b)(1) of the Act, 21 U.S.C. section 360bbb-3(b)(1), unless the authorization is terminated or revoked.     Resp Syncytial Virus by PCR NEGATIVE NEGATIVE Final    Comment: (NOTE) Fact Sheet for Patients: bloggercourse.com  Fact Sheet for Healthcare Providers: seriousbroker.it  This test is not yet approved or cleared by the [place]  FDA and has been authorized for detection and/or diagnosis of SARS-CoV-2 by FDA under an Emergency Use Authorization (EUA). This EUA will remain in effect (meaning this test can be used) for the duration of the COVID-19 declaration under Section 564(b)(1) of the Act, 21 U.S.C. section 360bbb-3(b)(1), unless the authorization is terminated or revoked.  Performed at [facility], [address] [place], [place] [address]   Respiratory (~20 pathogens) panel by PCR     Status: None   Collection Time: 02/11/23  8:38 PM   Specimen: Nasopharyngeal Swab; Respiratory   Result Value Ref Range Status   Adenovirus NOT DETECTED NOT DETECTED Final   Coronavirus 229E NOT DETECTED NOT DETECTED Final    Comment: (NOTE) The Coronavirus on the Respiratory Panel, DOES NOT test for the novel  Coronavirus (2019 nCoV)    Coronavirus HKU1 NOT DETECTED NOT DETECTED Final   Coronavirus NL63 NOT DETECTED NOT DETECTED Final   Coronavirus OC43 NOT DETECTED NOT DETECTED Final   Metapneumovirus NOT DETECTED NOT DETECTED Final   Rhinovirus / Enterovirus NOT DETECTED NOT DETECTED Final   Influenza A NOT DETECTED NOT DETECTED Final   Influenza B NOT DETECTED NOT DETECTED Final   Parainfluenza Virus 1 NOT DETECTED NOT DETECTED Final   Parainfluenza Virus 2 NOT DETECTED NOT DETECTED Final   Parainfluenza Virus 3 NOT DETECTED NOT DETECTED Final   Parainfluenza Virus 4 NOT DETECTED NOT DETECTED Final   Respiratory Syncytial Virus NOT DETECTED NOT DETECTED Final   Bordetella pertussis NOT DETECTED NOT DETECTED Final   Bordetella Parapertussis NOT DETECTED NOT DETECTED Final   Chlamydophila pneumoniae NOT DETECTED NOT DETECTED Final   Mycoplasma pneumoniae NOT DETECTED NOT DETECTED Final    Comment: Performed at [facility], [address], [place], [place] [address]  Culture, blood (Routine X 2) w Reflex to ID Panel     Status: None (Preliminary result)   Collection Time: 02/11/23  9:46 PM   Specimen: BLOOD  Result Value Ref Range Status   Specimen Description BLOOD BLOOD RIGHT HAND  Final   Special Requests   Final    BOTTLES DRAWN AEROBIC AND ANAEROBIC Blood Culture results may not be optimal due to an excessive volume of blood received in culture bottles   Culture   Final    NO GROWTH 2 DAYS Performed at [facility], [address] [place], [place] [address]    Report Status PENDING  Incomplete  Culture, blood (Routine X 2) w Reflex to ID Panel     Status: None (Preliminary result)   Collection Time: 02/11/23  9:46 PM   Specimen: BLOOD  Result  Value Ref Range Status   Specimen Description BLOOD BLOOD RIGHT HAND  Final   Special Requests   Final    BOTTLES DRAWN AEROBIC AND ANAEROBIC Blood Culture results may not be optimal due to an excessive volume of blood received in culture bottles   Culture   Final    NO GROWTH 2 DAYS Performed at [facility], [address], [place], [place] [address]    Report Status PENDING  Incomplete         Radiology Studies: DG Chest 2 View Result Date: 02/11/2023 CLINICAL DATA:  Shortness of breath. EXAM: CHEST - 2 VIEW COMPARISON:  08/12/2022 FINDINGS: The heart size and mediastinal contours are within normal limits. Scarring is again seen in the right upper lobe with several old right rib fracture deformities. The lungs are otherwise clear. Bilateral shoulder prostheses again noted. IMPRESSION: No active cardiopulmonary disease. Stable right upper lobe scarring and old right rib fracture deformities. Electronically Signed   By: Norleen DELENA Kil M.D.   On: 02/11/2023 15:01        Scheduled Meds:  amLODipine   5 mg Oral BID   amoxicillin -clavulanate  1 tablet Oral Q12H   apixaban   5 mg Oral BID   arformoterol   15 mcg Nebulization BID   azithromycin   500 mg Oral QHS   busPIRone   5 mg Oral BID   escitalopram   10 mg Oral Daily   famotidine   20 mg Oral BID   gabapentin   300 mg Oral Daily   And   gabapentin   600 mg Oral QHS   ipratropium-albuterol   3 mL Nebulization TID   lamoTRIgine   100 mg Oral BID   leflunomide   20 mg Oral Daily   loratadine   10 mg Oral QHS   methocarbamol   500 mg Oral TID   methylPREDNISolone  (SOLU-MEDROL ) injection  80 mg Intravenous Daily   mirabegron  ER  25 mg Oral Daily   montelukast   10 mg Oral Daily   multivitamin  1 tablet Oral Daily   nebivolol   5 mg Oral BID   pantoprazole   40 mg Oral BID AC   pravastatin   20 mg Oral q1800   QUEtiapine   50 mg Oral QHS   sucralfate   1 g Oral BID   torsemide   20 mg Oral Daily   traZODone   50 mg Oral QHS    Continuous Infusions:     LOS: 0 days      Calvin KATHEE Robson, MD [facility]   If 7PM-7AM, please contact night-coverage  02/13/2023, 1:29 PM

## 2023-02-13 NOTE — Evaluation (Signed)
 Occupational Therapy Evaluation Patient Details Name: Kerri Carter MRN: [id number] DOB: 06-16-1940 Today's Date: 02/13/2023   History of Present Illness Pt. is a 83 y.o. female who was admitted to [facility] with COPD exacerbation, interstitial pulmonary disease, Chronic diastolic CHF, A-Fib, Essential HTN, CAD, Hyperlipidemia, STage 3b CKD, OSA, Bipolar Disorder, Depression, Anxiety, Obesity.   Clinical Impression   Pt. Presents with weakness, limited activity tolerance, limited functional reaching, and limited functional mobility which hinders her ability to complete basic ADL and IADL functioning.  Pt. Resides at home with her grandson, and his family. Pt. Reports being independent with ADLs. Pt. Has a personal care aide who assists with IADL tasks 2-3 hours a day. Pt.'s family assist as needed. Pt. Uses 3LO2 at home chronically. Pt. Presents with a history of significant left shoulder limitations and has difficulty reaching during hair care, and toilet hygiene care.  Pt. Requires CGA sit to stand using a RW.  SpO2 94-97%, HR 89-94% on 3LO2. Pt. Presented with fair standing balance with the RW when challenged with functional BUE reaching. Pt. Education was provided about PLB techniques, as Pt. Get SOB,a nd has difficulty talking when standing due to SOB.  Pt. Education was provided about energy conservation, and work simplification techniques for ADLs. A visual handout was provided to the Pt. A visual handout was provided to the Pt. Pt.'s questions were answered about grab bar installation for the shower, and A/E available to ADLs. Pt. Currently has been using A/E for LE ADLs at home. Pt. will benefit from OT services for ADL training, A/E training, increasing activity tolerance, and pt. Education about  nergy conservation, work simplification, home modification, and DME.       If plan is discharge home, recommend the following: A little help with bathing/dressing/bathroom;A little help with walking  and/or transfers;Assistance with cooking/housework    Functional Status Assessment  Patient has had a recent decline in their functional status and/or demonstrates limited ability to make significant improvements in function in a reasonable and predictable amount of time  Equipment Recommendations       Recommendations for Other Services       Precautions / Restrictions Precautions Precautions: Fall Restrictions Weight Bearing Restrictions Per Provider Order: No      Mobility Bed Mobility               General bed mobility comments: Pt. up in recliner chair upon arrival    Transfers Overall transfer level: Needs assistance   Transfers: Sit to/from Stand Sit to Stand: Contact guard assist                  Balance  Fair standing balance with a RW. Fair balance when attempting functional reaching with the BUEs.                                         ADL either performed or assessed with clinical judgement   ADL Overall ADL's : Needs assistance/impaired       Grooming Details (indicate cue type and reason): Independent  with set-up for oral care. Difficulty reaching to the top, and back of her head for hair care.         Upper Body Dressing : Minimal assistance   Lower Body Dressing: Minimal assistance Lower Body Dressing Details (indicate cue type and reason): with A/E       Toileting -  Clothing Manipulation Details (indicate cue type and reason): Difficulty reaching to perform thorough toilet hygiene     Functional mobility during ADLs: Contact guard assist  Pt. Education was provided about energy conservation, work simplification techniques, PLB techniques. Pt. Was provided with a visual handout.  Pt. Education about A/E, and DME grab bar installation.       Vision  Macular degeneration       Perception         Praxis         Pertinent Vitals/Pain Pain Assessment Pain Assessment: No/denies pain      Extremity/Trunk Assessment Upper Extremity Assessment Upper Extremity Assessment: LUE deficits/detail (right sided generalized weakness.) LUE Deficits / Details: History of left shoulder limitations 2-/5 flexion, abduction, 3/5 elbow flexion, 3+/5 elbow extension           Communication Communication Communication: No apparent difficulties   Cognition Arousal: Alert Behavior During Therapy: WFL for tasks assessed/performed Overall Cognitive Status: Within Functional Limits for tasks assessed                                       General Comments       Exercises     Shoulder Instructions      Home Living Family/patient expects to be discharged to:: Private residence   Available Help at Discharge: Family;Available PRN/intermittently Type of Home: House Home Access: Ramped entrance     Home Layout: One level     Bathroom Shower/Tub: Producer, Television/film/video: Handicapped height Bathroom Accessibility: Yes   Home Equipment: Rollator (4 wheels);BSC/3in1;Shower seat;Toilet riser;Grab bars - toilet;Hand held shower head;Grab bars - tub/shower;Adaptive equipment;Transport chair;Other (comment);Wheelchair - power [employer] Equipment: Reacher;Sock aid;Long-handled shoe horn;Long-handled sponge        Prior Functioning/Environment Prior Level of Function : Independent/Modified Independent               ADLs Comments: Pt. reports Modified Independence with ADLs, using A/E for LE ADLs. Assist for IADLs from a personal care aide 2-3 hours a day. Pt. does not drive.        OT Problem List: Decreased strength;Decreased range of motion;Decreased activity tolerance      OT Treatment/Interventions: Self-care/ADL training;Therapeutic exercise;DME and/or AE instruction;Therapeutic activities;Patient/family education    OT Goals(Current goals can be found in the care plan section) Acute Rehab OT Goals Patient Stated Goal: To get better OT Goal  Formulation: With patient Time For Goal Achievement: 02/27/23 Potential to Achieve Goals: Good  OT Frequency: Min 2X/week    Co-evaluation              AM-PAC OT 6 Clicks Daily Activity     Outcome Measure Help from another person eating meals?: None Help from another person taking care of personal grooming?: A Little Help from another person toileting, which includes using toliet, bedpan, or urinal?: A Little Help from another person bathing (including washing, rinsing, drying)?: A Little Help from another person to put on and taking off regular upper body clothing?: A Little Help from another person to put on and taking off regular lower body clothing?: A Little 6 Click Score: 19   End of Session Equipment Utilized During Treatment: Gait belt;Rolling walker (2 wheels)  Activity Tolerance: Patient tolerated treatment well Patient left: in chair;with nursing/sitter in room;with call bell/phone within reach  OT Visit Diagnosis: History of falling (Z91.81);Muscle weakness (generalized) (M62.81)  Time: 8565-8468 OT Time Calculation (min): 57 min Charges:  OT General Charges $OT Visit: 1 Visit OT Evaluation $OT Eval Moderate Complexity: 1 Mod OT Treatments $Self Care/Home Management : 23-37 mins Richardson Otter, MS, OTR/L  Ramondo Dietze 02/13/2023, 4:40 PM

## 2023-02-14 ENCOUNTER — Other Ambulatory Visit: Payer: Self-pay | Admitting: Family Medicine

## 2023-02-14 ENCOUNTER — Other Ambulatory Visit: Payer: Self-pay | Admitting: Pulmonary Disease

## 2023-02-14 DIAGNOSIS — J441 Chronic obstructive pulmonary disease with (acute) exacerbation: Secondary | ICD-10-CM | POA: Diagnosis not present

## 2023-02-14 MED ORDER — METHYLPREDNISOLONE SODIUM SUCC 40 MG IJ SOLR
40.0000 mg | Freq: Every day | INTRAMUSCULAR | Status: DC
  Administered 2023-02-15 – 2023-02-16 (×2): 40 mg via INTRAVENOUS
  Filled 2023-02-14 (×2): qty 1

## 2023-02-14 NOTE — Evaluation (Signed)
 Physical Therapy Evaluation Patient Details Name: Kerri Carter MRN: [id number] DOB: 1940/05/05 Today's Date: 02/14/2023  History of Present Illness  Pt. is a 83 y.o. female who was admitted to [facility] with COPD exacerbation, interstitial pulmonary disease, Chronic diastolic CHF, A-Fib, Essential HTN, CAD, Hyperlipidemia, STage 3b CKD, OSA, Bipolar Disorder, Depression, Anxiety, Obesity.  Clinical Impression  Pt is a pleasant 83 year old female who was admitted for COPD exacerbation. Pt performs bed mobility, transfers, and ambulation with cga and RW. Becomes wheezy and SOB with limited exertion. All mobility performed on 3L of O2. Pt demonstrates deficits with strength/mobility. Would benefit from skilled PT to address above deficits and promote optimal return to PLOF. Pt will continue to receive skilled PT services while admitted and will defer to TOC/care team for updates regarding disposition planning.       If plan is discharge home, recommend the following: A little help with walking and/or transfers;A little help with bathing/dressing/bathroom   Can travel by private vehicle        Equipment Recommendations None recommended by PT  Recommendations for Other Services       Functional Status Assessment Patient has had a recent decline in their functional status and demonstrates the ability to make significant improvements in function in a reasonable and predictable amount of time.     Precautions / Restrictions Precautions Precautions: Fall Restrictions Weight Bearing Restrictions Per Provider Order: No      Mobility  Bed Mobility Overal bed mobility: Needs Assistance Bed Mobility: Supine to Sit     Supine to sit: Contact guard     General bed mobility comments: reaches for therapist hand during mobility efforts. Once seated at EOB, upright posture noted    Transfers Overall transfer level: Needs assistance Equipment used: Rolling walker (2 wheels) Transfers: Sit  to/from Stand Sit to Stand: Contact guard assist           General transfer comment: all mobility performed on 3L of O2.    Ambulation/Gait Ambulation/Gait assistance: Contact guard assist Gait Distance (Feet): 5 Feet Assistive device: Rolling walker (2 wheels) Gait Pattern/deviations: Step-to pattern       General Gait Details: gets SOB with limited exertion. Declines further ambulation this session, however agreeable to sit in recliner. Noted wide BOS during ambulation  Stairs            Wheelchair Mobility     Tilt Bed    Modified Rankin (Stroke Patients Only)       Balance Overall balance assessment: Needs assistance, History of Falls Sitting-balance support: Feet supported Sitting balance-Leahy Scale: Good     Standing balance support: Bilateral upper extremity supported Standing balance-Leahy Scale: Fair                               Pertinent Vitals/Pain Pain Assessment Pain Assessment: No/denies pain    Home Living Family/patient expects to be discharged to:: Private residence Living Arrangements: Other (Comment) (grandson/wife/kids) Available Help at Discharge: Family;Available PRN/intermittently Type of Home: House Home Access: Ramped entrance       Home Layout: One level Home Equipment: Rollator (4 wheels);BSC/3in1;Shower seat;Toilet riser;Grab bars - toilet;Hand held shower head;Grab bars - tub/shower;Adaptive equipment;Transport chair;Other (comment);Wheelchair - power Additional Comments: lift chair, adjustable bed that elevates at head and foot    Prior Function Prior Level of Function : Independent/Modified Independent             Mobility  Comments: MOD I amb household distances with rollator ADLs Comments: Pt. reports Modified Independence with ADLs, using A/E for LE ADLs. Assist for IADLs from a personal care aide 2-3 hours a day. Pt. does not drive.     Extremity/Trunk Assessment   Upper Extremity  Assessment Upper Extremity Assessment: Overall WFL for tasks assessed    Lower Extremity Assessment Lower Extremity Assessment: Generalized weakness (B LE grossly 4/5)       Communication   Communication Communication: No apparent difficulties  Cognition Arousal: Alert Behavior During Therapy: WFL for tasks assessed/performed Overall Cognitive Status: Within Functional Limits for tasks assessed                                 General Comments: pleasant and agreeable to session        General Comments      Exercises     Assessment/Plan    PT Assessment Patient needs continued PT services  PT Problem List Decreased strength;Decreased activity tolerance;Decreased balance;Decreased mobility       PT Treatment Interventions DME instruction;Gait training;Therapeutic exercise;Balance training    PT Goals (Current goals can be found in the Care Plan section)  Acute Rehab PT Goals Patient Stated Goal: to go home PT Goal Formulation: With patient Time For Goal Achievement: 02/28/23 Potential to Achieve Goals: Good    Frequency Min 1X/week     Co-evaluation               AM-PAC PT 6 Clicks Mobility  Outcome Measure Help needed turning from your back to your side while in a flat bed without using bedrails?: A Little Help needed moving from lying on your back to sitting on the side of a flat bed without using bedrails?: A Little Help needed moving to and from a bed to a chair (including a wheelchair)?: A Little Help needed standing up from a chair using your arms (e.g., wheelchair or bedside chair)?: A Little Help needed to walk in hospital room?: A Lot Help needed climbing 3-5 steps with a railing? : A Lot 6 Click Score: 16    End of Session Equipment Utilized During Treatment: Oxygen ;Gait belt Activity Tolerance: Patient tolerated treatment well Patient left: in chair Nurse Communication: Mobility status PT Visit Diagnosis: Unsteadiness on  feet (R26.81);Muscle weakness (generalized) (M62.81);History of falling (Z91.81);Difficulty in walking, not elsewhere classified (R26.2)    Time: 8884-8864 PT Time Calculation (min) (ACUTE ONLY): 20 min   Charges:   PT Evaluation $PT Eval Low Complexity: 1 Low   PT General Charges $$ ACUTE PT VISIT: 1 Visit         Corean Dade, PT, DPT, GCS [phone]   Adelma Bowdoin 02/14/2023, 2:40 PM

## 2023-02-14 NOTE — Progress Notes (Signed)
 PROGRESS NOTE    Kerri Carter  FMW:[id number] DOB: 12/28/1940 DOA: 02/11/2023 PCP: Hope Merle, MD    Brief Narrative:  83 y.o. female with medical history significant of ILD and COPD on 2-3 L oxygen , hypertension, hyperlipidemia, PVD, CAD, diastolic CHF, depression with anxiety, bipolar, CKD-3B, A-fib on Eliquis , OSA on CPAP, obesity, who presents with shortness of breath.   Patient states that she has progressively worsening shortness breath for more than 3 days.  She has cough with yellow-colored sputum production.  No chest pain, fever or chills.  No nausea, vomiting, diarrhea or abdominal pain.  No symptoms of UTI.   Assessment & Plan:   Active Problems:   COPD exacerbation (HCC)   Interstitial pulmonary disease (HCC)   Chronic diastolic CHF (congestive heart failure) (HCC)   Atrial fibrillation, chronic (HCC)   Essential hypertension   CAD (coronary artery disease)   Hyperlipidemia   Stage 3b chronic kidney disease (HCC)   Rheumatoid arthritis (HCC)   OSA (obstructive sleep apnea)   Bipolar disorder, current episode mixed, unspecified (HCC)   Depression with anxiety   Obesity (BMI 30-39.9)  COPD exacerbation (HCC) Chronic hypoxic respiratory failure Chest x-ray negative for acute infiltrate however patient continues to have cough productive of yellow sputum.  She is on 3 L nasal cannula.  COVID-negative.  Still with significant wheeze and dyspnea.   Plan: Continue IV Solu-Medrol  80 mg daily today Decrease dose to 40 daily from tomorrow Continue to de-escalate to p.o. prednisone  as tolerated Continue bronchodilator regimen Mucolytic's and antitussives as needed Empiric antimicrobial therapy  Interstitial pulmonary disease (HCC) See above for acute management   Chronic diastolic CHF (congestive heart failure) (HCC):  2D echo on 03/30/2022 showed EF of 55 to 60% with grade 2 diastolic dysfunction.  Patient does not have leg edema JVD.  BNP is 195.  CHF seem to be  compensated. -Continue home Demadex  20 mg daily -Outpatient cardiology follow-up   Atrial fibrillation, chronic (HCC): Heart rate 88 Continue home Bystolic  and Eliquis    Essential hypertension -IV hydralazine  as needed -Patient torsemide  and nebivolol  -Amlodipine    CAD (coronary artery disease) Continue pravastatin    Hyperlipidemia -Pravastatin    Stage 3b chronic kidney disease (HCC):  Renal function close to baseline.  Baseline creatinine 1.0-1.3 recently.  Her creatinine is 1.33, BUN 29, GFR 40 -Follow-up with BMP   Rheumatoid arthritis [facility]): Patient is getting Enbrel injection -Continue home leflunomide    OSA (obstructive sleep apnea) -CPAP nightly   Bipolar disorder, current episode mixed, unspecified (HCC) and depression with anxiety -Continue home medications   Obesity (BMI 30-39.9): Body weight 79.4 kg, BMI 34.76 -Encourage losing weight -Exercise and healthy diet   DVT prophylaxis: Apixaban  Code Status: DNR limited Family Communication: None Disposition Plan: Status is: Inpatient Remains inpatient appropriate because: COPD exacerbation       Level of care: Med-Surg  Consultants:  None  Procedures:  None  Antimicrobials: Azithromycin  Augmentin    Subjective: Seen examined.  Resting in bed.  Reports shortness of breath improving.  Not yet at baseline.  Objective: Vitals:   02/14/23 0300 02/14/23 0453 02/14/23 0756 02/14/23 0853  BP:  132/66  131/65  Pulse:  87  88  Resp:  18  15  Temp:  97.9 F (36.6 C)  98 F (36.7 C)  TempSrc:  Oral    SpO2: 95% 97% 95% 96%  Weight:      Height:        Intake/Output Summary (Last 24 hours) at 02/14/2023  1243 Last data filed at 02/13/2023 1800 Gross per 24 hour  Intake 240 ml  Output --  Net 240 ml   Filed Weights   02/11/23 1838  Weight: 79.4 kg    Examination:  General exam: No acute distress.  Sitting up in chair Respiratory system: Bibasilar wheeze.  No crackles.  No conversational  dyspnea.  3 L Cardiovascular system: S1-S2, regular rate, irregular rhythm, no murmurs, no pedal edema Gastrointestinal system: Soft, NT/ND, normal bowel sounds Central nervous system: Alert and oriented. No focal neurological deficits. Extremities: Symmetric 5 x 5 power. Skin: No rashes, lesions or ulcers Psychiatry: Judgement and insight appear normal. Mood & affect appropriate.     Data Reviewed: I have personally reviewed following labs and imaging studies  CBC: Recent Labs  Lab 02/11/23 1350 02/12/23 0531  WBC 15.9* 16.4*  NEUTROABS 10.3*  --   HGB 11.2* 10.4*  HCT 35.6* 32.9*  MCV 89.0 87.5  PLT 281 265   Basic Metabolic Panel: Recent Labs  Lab 02/11/23 1350 02/12/23 0531  NA 138 140  K 4.0 4.9  CL 103 104  CO2 21* 24  GLUCOSE 160* 174*  BUN 29* 29*  CREATININE 1.33* 1.35*  CALCIUM  8.5* 8.1*   GFR: Estimated Creatinine Clearance: 30.6 mL/min (A) (by C-G formula based on SCr of 1.35 mg/dL (H)). Liver Function Tests: Recent Labs  Lab 02/11/23 1350  AST 20  ALT 17  ALKPHOS 82  BILITOT 0.6  PROT 7.4  ALBUMIN  3.4*   No results for input(s): LIPASE, AMYLASE in the last 168 hours. No results for input(s): AMMONIA in the last 168 hours. Coagulation Profile: No results for input(s): INR, PROTIME in the last 168 hours. Cardiac Enzymes: No results for input(s): CKTOTAL, CKMB, CKMBINDEX, TROPONINI in the last 168 hours. BNP (last 3 results) No results for input(s): PROBNP in the last 8760 hours. HbA1C: No results for input(s): HGBA1C in the last 72 hours. CBG: No results for input(s): GLUCAP in the last 168 hours. Lipid Profile: No results for input(s): CHOL, HDL, LDLCALC, TRIG, CHOLHDL, LDLDIRECT in the last 72 hours. Thyroid  Function Tests: No results for input(s): TSH, T4TOTAL, FREET4, T3FREE, THYROIDAB in the last 72 hours. Anemia Panel: No results for input(s): VITAMINB12, FOLATE, FERRITIN, TIBC,  IRON , RETICCTPCT in the last 72 hours. Sepsis Labs: Recent Labs  Lab 02/12/23 0531  PROCALCITON <0.10    Recent Results (from the past 240 hours)  Resp panel by RT-PCR (RSV, Flu A&B, Covid) Anterior Nasal Swab     Status: None   Collection Time: 02/11/23  6:20 PM   Specimen: Anterior Nasal Swab  Result Value Ref Range Status   SARS Coronavirus 2 by RT PCR NEGATIVE NEGATIVE Final    Comment: (NOTE) SARS-CoV-2 target nucleic acids are NOT DETECTED.  The SARS-CoV-2 RNA is generally detectable in upper respiratory specimens during the acute phase of infection. The lowest concentration of SARS-CoV-2 viral copies this assay can detect is 138 copies/mL. A negative result does not preclude SARS-Cov-2 infection and should not be used as the sole basis for treatment or other patient management decisions. A negative result may occur with  improper specimen collection/handling, submission of specimen other than nasopharyngeal swab, presence of viral mutation(s) within the areas targeted by this assay, and inadequate number of viral copies(<138 copies/mL). A negative result must be combined with clinical observations, patient history, and epidemiological information. The expected result is Negative.  Fact Sheet for Patients:  bloggercourse.com  Fact Sheet for Healthcare Providers:  seriousbroker.it  This test is no t yet approved or cleared by the [place]  FDA and  has been authorized for detection and/or diagnosis of SARS-CoV-2 by FDA under an Emergency Use Authorization (EUA). This EUA will remain  in effect (meaning this test can be used) for the duration of the COVID-19 declaration under Section 564(b)(1) of the Act, 21 U.S.C.section 360bbb-3(b)(1), unless the authorization is terminated  or revoked sooner.       Influenza A by PCR NEGATIVE NEGATIVE Final   Influenza B by PCR NEGATIVE NEGATIVE Final    Comment:  (NOTE) The Xpert Xpress SARS-CoV-2/FLU/RSV plus assay is intended as an aid in the diagnosis of influenza from Nasopharyngeal swab specimens and should not be used as a sole basis for treatment. Nasal washings and aspirates are unacceptable for Xpert Xpress SARS-CoV-2/FLU/RSV testing.  Fact Sheet for Patients: bloggercourse.com  Fact Sheet for Healthcare Providers: seriousbroker.it  This test is not yet approved or cleared by the [place]  FDA and has been authorized for detection and/or diagnosis of SARS-CoV-2 by FDA under an Emergency Use Authorization (EUA). This EUA will remain in effect (meaning this test can be used) for the duration of the COVID-19 declaration under Section 564(b)(1) of the Act, 21 U.S.C. section 360bbb-3(b)(1), unless the authorization is terminated or revoked.     Resp Syncytial Virus by PCR NEGATIVE NEGATIVE Final    Comment: (NOTE) Fact Sheet for Patients: bloggercourse.com  Fact Sheet for Healthcare Providers: seriousbroker.it  This test is not yet approved or cleared by the [place]  FDA and has been authorized for detection and/or diagnosis of SARS-CoV-2 by FDA under an Emergency Use Authorization (EUA). This EUA will remain in effect (meaning this test can be used) for the duration of the COVID-19 declaration under Section 564(b)(1) of the Act, 21 U.S.C. section 360bbb-3(b)(1), unless the authorization is terminated or revoked.  Performed at [facility], [address] [place], [place] [address]   Respiratory (~20 pathogens) panel by PCR     Status: None   Collection Time: 02/11/23  8:38 PM   Specimen: Nasopharyngeal Swab; Respiratory  Result Value Ref Range Status   Adenovirus NOT DETECTED NOT DETECTED Final   Coronavirus 229E NOT DETECTED NOT DETECTED Final    Comment: (NOTE) The Coronavirus on the Respiratory Panel,  DOES NOT test for the novel  Coronavirus (2019 nCoV)    Coronavirus HKU1 NOT DETECTED NOT DETECTED Final   Coronavirus NL63 NOT DETECTED NOT DETECTED Final   Coronavirus OC43 NOT DETECTED NOT DETECTED Final   Metapneumovirus NOT DETECTED NOT DETECTED Final   Rhinovirus / Enterovirus NOT DETECTED NOT DETECTED Final   Influenza A NOT DETECTED NOT DETECTED Final   Influenza B NOT DETECTED NOT DETECTED Final   Parainfluenza Virus 1 NOT DETECTED NOT DETECTED Final   Parainfluenza Virus 2 NOT DETECTED NOT DETECTED Final   Parainfluenza Virus 3 NOT DETECTED NOT DETECTED Final   Parainfluenza Virus 4 NOT DETECTED NOT DETECTED Final   Respiratory Syncytial Virus NOT DETECTED NOT DETECTED Final   Bordetella pertussis NOT DETECTED NOT DETECTED Final   Bordetella Parapertussis NOT DETECTED NOT DETECTED Final   Chlamydophila pneumoniae NOT DETECTED NOT DETECTED Final   Mycoplasma pneumoniae NOT DETECTED NOT DETECTED Final    Comment: Performed at [facility], [address] [place], [place] [address]  Culture, blood (Routine X 2) w Reflex to ID Panel     Status: None (Preliminary result)   Collection Time: 02/11/23  9:46  PM   Specimen: BLOOD  Result Value Ref Range Status   Specimen Description BLOOD BLOOD RIGHT HAND  Final   Special Requests   Final    BOTTLES DRAWN AEROBIC AND ANAEROBIC Blood Culture results may not be optimal due to an excessive volume of blood received in culture bottles   Culture   Final    NO GROWTH 3 DAYS Performed at [facility], [address] [place], [place] [address]    Report Status PENDING  Incomplete  Culture, blood (Routine X 2) w Reflex to ID Panel     Status: None (Preliminary result)   Collection Time: 02/11/23  9:46 PM   Specimen: BLOOD  Result Value Ref Range Status   Specimen Description BLOOD BLOOD RIGHT HAND  Final   Special Requests   Final    BOTTLES DRAWN AEROBIC AND ANAEROBIC Blood Culture results may not be optimal due to an  excessive volume of blood received in culture bottles   Culture   Final    NO GROWTH 3 DAYS Performed at [facility], [address], [place], [place] [address]    Report Status PENDING  Incomplete         Radiology Studies: No results found.       Scheduled Meds:  amLODipine   5 mg Oral BID   amoxicillin -clavulanate  1 tablet Oral Q12H   apixaban   5 mg Oral BID   arformoterol   15 mcg Nebulization BID   azithromycin   500 mg Oral QHS   busPIRone   5 mg Oral BID   escitalopram   10 mg Oral Daily   famotidine   20 mg Oral BID   gabapentin   300 mg Oral Daily   And   gabapentin   600 mg Oral QHS   ipratropium-albuterol   3 mL Nebulization Q6H   lamoTRIgine   100 mg Oral BID   leflunomide   20 mg Oral Daily   loratadine   10 mg Oral QHS   methocarbamol   500 mg Oral TID   methylPREDNISolone  (SOLU-MEDROL ) injection  80 mg Intravenous Daily   mirabegron  ER  25 mg Oral Daily   montelukast   10 mg Oral Daily   multivitamin  1 tablet Oral Daily   nebivolol   5 mg Oral BID   nystatin   5 mL Mouth/Throat QID   pantoprazole   40 mg Oral BID AC   pravastatin   20 mg Oral q1800   QUEtiapine   50 mg Oral QHS   sucralfate   1 g Oral BID   torsemide   20 mg Oral Daily   traZODone   50 mg Oral QHS   Continuous Infusions:     LOS: 1 day      Calvin KATHEE Robson, MD [facility]   If 7PM-7AM, please contact night-coverage  02/14/2023, 12:43 PM

## 2023-02-14 NOTE — Plan of Care (Signed)
  Problem: Education: Goal: Knowledge of General Education information will improve Description: Including pain rating scale, medication(s)/side effects and non-pharmacologic comfort measures Outcome: Progressing   Problem: Health Behavior/Discharge Planning: Goal: Ability to manage health-related needs will improve Outcome: Progressing   Problem: Clinical Measurements: Goal: Ability to maintain clinical measurements within normal limits will improve Outcome: Progressing Goal: Will remain free from infection Outcome: Progressing Goal: Diagnostic test results will improve Outcome: Progressing Goal: Respiratory complications will improve Outcome: Progressing Goal: Cardiovascular complication will be avoided Outcome: Progressing   Problem: Elimination: Goal: Will not experience complications related to bowel motility Outcome: Progressing Goal: Will not experience complications related to urinary retention Outcome: Progressing   Problem: Pain Management: Goal: General experience of comfort will improve Outcome: Progressing   Problem: Safety: Goal: Ability to remain free from injury will improve Outcome: Progressing   Problem: Skin Integrity: Goal: Risk for impaired skin integrity will decrease Outcome: Progressing   Problem: Respiratory: Goal: Ability to maintain a clear airway will improve Outcome: Progressing Goal: Levels of oxygenation will improve Outcome: Progressing Goal: Ability to maintain adequate ventilation will improve Outcome: Progressing

## 2023-02-15 DIAGNOSIS — J441 Chronic obstructive pulmonary disease with (acute) exacerbation: Secondary | ICD-10-CM | POA: Diagnosis not present

## 2023-02-15 LAB — CBC WITH DIFFERENTIAL/PLATELET
Abs Immature Granulocytes: 1.38 10*3/uL — ABNORMAL HIGH (ref 0.00–0.07)
Basophils Absolute: 0.1 10*3/uL (ref 0.0–0.1)
Basophils Relative: 1 %
Eosinophils Absolute: 0.2 10*3/uL (ref 0.0–0.5)
Eosinophils Relative: 1 %
HCT: 31 % — ABNORMAL LOW (ref 36.0–46.0)
Hemoglobin: 9.9 g/dL — ABNORMAL LOW (ref 12.0–15.0)
Immature Granulocytes: 7 %
Lymphocytes Relative: 13 %
Lymphs Abs: 2.7 10*3/uL (ref 0.7–4.0)
MCH: 27.3 pg (ref 26.0–34.0)
MCHC: 31.9 g/dL (ref 30.0–36.0)
MCV: 85.6 fL (ref 80.0–100.0)
Monocytes Absolute: 1.4 10*3/uL — ABNORMAL HIGH (ref 0.1–1.0)
Monocytes Relative: 7 %
Neutro Abs: 14.8 10*3/uL — ABNORMAL HIGH (ref 1.7–7.7)
Neutrophils Relative %: 71 %
Platelets: 298 10*3/uL (ref 150–400)
RBC: 3.62 MIL/uL — ABNORMAL LOW (ref 3.87–5.11)
RDW: 15.2 % (ref 11.5–15.5)
Smear Review: NORMAL
WBC: 20.6 10*3/uL — ABNORMAL HIGH (ref 4.0–10.5)
nRBC: 0.7 % — ABNORMAL HIGH (ref 0.0–0.2)

## 2023-02-15 LAB — BASIC METABOLIC PANEL
Anion gap: 11 (ref 5–15)
BUN: 38 mg/dL — ABNORMAL HIGH (ref 8–23)
CO2: 26 mmol/L (ref 22–32)
Calcium: 8.2 mg/dL — ABNORMAL LOW (ref 8.9–10.3)
Chloride: 101 mmol/L (ref 98–111)
Creatinine, Ser: 1.25 mg/dL — ABNORMAL HIGH (ref 0.44–1.00)
GFR, Estimated: 44 mL/min — ABNORMAL LOW (ref >60)
Glucose, Bld: 248 mg/dL — ABNORMAL HIGH (ref 70–99)
Potassium: 4.1 mmol/L (ref 3.5–5.1)
Sodium: 138 mmol/L (ref 135–145)

## 2023-02-15 MED ORDER — IPRATROPIUM-ALBUTEROL 0.5-2.5 (3) MG/3ML IN SOLN
3.0000 mL | Freq: Three times a day (TID) | RESPIRATORY_TRACT | Status: DC
  Administered 2023-02-15 – 2023-02-16 (×4): 3 mL via RESPIRATORY_TRACT
  Filled 2023-02-15 (×4): qty 3

## 2023-02-15 NOTE — Progress Notes (Signed)
 Physical Therapy Treatment Patient Details Name: Kerri Carter MRN: [id number] DOB: 04-24-1940 Today's Date: 02/15/2023   History of Present Illness Pt. is a 83 y.o. female who was admitted to [facility] with COPD exacerbation, interstitial pulmonary disease, Chronic diastolic CHF, A-Fib, Essential HTN, CAD, Hyperlipidemia, STage 3b CKD, OSA, Bipolar Disorder, Depression, Anxiety, Obesity.    PT Comments  Pt received in bed, A&Ox4, 97% at rest on baseline 3L O2. Discussed current POC and set up at home. Pt has a ramp to enter with intermittent Supervision from family. Sleeps in an adjustable bed, currently not requiring any assist for bed mobility. Able to complete transfers and short distance gait with RW and CGA for safety and management of O2 line. Pt has a Rollator at home in case for fatigue while mobilizing. Initial d/c recs for HHPT remain appropriate.  Pt remained in mid 90's upon exertion. Continue PT per POC.    If plan is discharge home, recommend the following: A little help with walking and/or transfers;A little help with bathing/dressing/bathroom   Can travel by private vehicle        Equipment Recommendations  None recommended by PT (Has a Rollator at home and ramp to enter)    Recommendations for Other Services       Precautions / Restrictions Precautions Precautions: Fall Restrictions Weight Bearing Restrictions Per Provider Order: No     Mobility  Bed Mobility Overal bed mobility: Needs Assistance Bed Mobility: Supine to Sit     Supine to sit: Contact guard, HOB elevated, Used rails     General bed mobility comments: Verbal cues given to not hold her breath during transfers    Transfers Overall transfer level: Needs assistance Equipment used: Rolling walker (2 wheels) Transfers: Sit to/from Stand Sit to Stand: Contact guard assist           General transfer comment: all mobility performed on 3L of O2 (baseline)    Ambulation/Gait Ambulation/Gait  assistance: Contact guard assist Gait Distance (Feet): 5 Feet Assistive device: Rolling walker (2 wheels) Gait Pattern/deviations: Step-to pattern Gait velocity: decr     General Gait Details: gets SOB with limited exertion. Has a Rollator at home for rest breaks   Stairs Stairs:  (Pt has a ramp to enter)           Wheelchair Mobility     Tilt Bed    Modified Rankin (Stroke Patients Only)       Balance Overall balance assessment: Needs assistance, History of Falls Sitting-balance support: Feet supported Sitting balance-Leahy Scale: Good     Standing balance support: Bilateral upper extremity supported Standing balance-Leahy Scale: Fair                              Cognition Arousal: Alert Behavior During Therapy: WFL for tasks assessed/performed Overall Cognitive Status: Within Functional Limits for tasks assessed                                 General Comments: pleasant and agreeable to session        Exercises General Exercises - Lower Extremity Ankle Circles/Pumps: AROM, Both, 10 reps Long Arc Quad: AROM, Both, 10 reps, Seated    General Comments General comments (skin integrity, edema, etc.): Pt educated on role of PT, PLB technique, and pacing to reduce SOB upon exertion      Pertinent Vitals/Pain  Pain Assessment Pain Assessment: No/denies pain    Home Living                          Prior Function            PT Goals (current goals can now be found in the care plan section) Acute Rehab PT Goals Patient Stated Goal: to go home Progress towards PT goals: Progressing toward goals    Frequency    Min 1X/week      PT Plan      Co-evaluation              AM-PAC PT 6 Clicks Mobility   Outcome Measure  Help needed turning from your back to your side while in a flat bed without using bedrails?: A Little Help needed moving from lying on your back to sitting on the side of a flat bed  without using bedrails?: A Little Help needed moving to and from a bed to a chair (including a wheelchair)?: A Little Help needed standing up from a chair using your arms (e.g., wheelchair or bedside chair)?: A Little Help needed to walk in hospital room?: A Little Help needed climbing 3-5 steps with a railing? : A Lot 6 Click Score: 17    End of Session Equipment Utilized During Treatment: Oxygen ;Gait belt Activity Tolerance: Patient tolerated treatment well Patient left: in chair Nurse Communication: Mobility status PT Visit Diagnosis: Unsteadiness on feet (R26.81);Muscle weakness (generalized) (M62.81);History of falling (Z91.81);Difficulty in walking, not elsewhere classified (R26.2)     Time: 1130-1156 PT Time Calculation (min) (ACUTE ONLY): 26 min  Charges:    $Gait Training: 8-22 mins $Therapeutic Exercise: 8-22 mins PT General Charges $$ ACUTE PT VISIT: 1 Visit                    Darice Bohr, PTA  Darice JAYSON Bohr 02/15/2023, 12:56 PM

## 2023-02-15 NOTE — Plan of Care (Signed)

## 2023-02-15 NOTE — Progress Notes (Signed)
 PROGRESS NOTE    Kerri Carter  FMW:[id number] DOB: 10/19/1940 DOA: 02/11/2023 PCP: Hope Merle, MD    Brief Narrative:  83 y.o. female with medical history significant of ILD and COPD on 2-3 L oxygen , hypertension, hyperlipidemia, PVD, CAD, diastolic CHF, depression with anxiety, bipolar, CKD-3B, A-fib on Eliquis , OSA on CPAP, obesity, who presents with shortness of breath.   Patient states that she has progressively worsening shortness breath for more than 3 days.  She has cough with yellow-colored sputum production.  No chest pain, fever or chills.  No nausea, vomiting, diarrhea or abdominal pain.  No symptoms of UTI.   Assessment & Plan:   Active Problems:   COPD exacerbation (HCC)   Interstitial pulmonary disease (HCC)   Chronic diastolic CHF (congestive heart failure) (HCC)   Atrial fibrillation, chronic (HCC)   Essential hypertension   CAD (coronary artery disease)   Hyperlipidemia   Stage 3b chronic kidney disease (HCC)   Rheumatoid arthritis (HCC)   OSA (obstructive sleep apnea)   Bipolar disorder, current episode mixed, unspecified (HCC)   Depression with anxiety   Obesity (BMI 30-39.9)  COPD exacerbation (HCC) Chronic hypoxic respiratory failure Chest x-ray negative for acute infiltrate however patient continues to have cough productive of yellow sputum.  She is on 3 L nasal cannula.  COVID-negative.  Still with significant wheeze and dyspnea.   Plan: Continue IV Solu-Medrol  40mg  daily Consider de-escalation to p.o. prednisone  starting tomorrow Continue bronchodilator regimen Mucolytic's and antitussives as needed Empiric antimicrobial therapy, 7-day course  Interstitial pulmonary disease (HCC) See above for acute management   Chronic diastolic CHF (congestive heart failure) (HCC):  2D echo on 03/30/2022 showed EF of 55 to 60% with grade 2 diastolic dysfunction.  Patient does not have leg edema JVD.  BNP is 195.  CHF seem to be compensated. -Continue home  Demadex  20 mg daily -Outpatient cardiology follow-up   Atrial fibrillation, chronic (HCC): Heart rate 88 Continue home Bystolic  and Eliquis    Essential hypertension -IV hydralazine  as needed -Patient torsemide  and nebivolol  -Amlodipine    CAD (coronary artery disease) Continue pravastatin    Hyperlipidemia -Pravastatin    Stage 3b chronic kidney disease (HCC):  Renal function close to baseline.  Baseline creatinine 1.0-1.3 recently.  Her creatinine is 1.33, BUN 29, GFR 40 -Follow-up with BMP   Rheumatoid arthritis [facility]): Patient is getting Enbrel injection -Continue home leflunomide    OSA (obstructive sleep apnea) -CPAP nightly   Bipolar disorder, current episode mixed, unspecified (HCC) and depression with anxiety -Continue home medications   Obesity (BMI 30-39.9): Body weight 79.4 kg, BMI 34.76 -Encourage losing weight -Exercise and healthy diet   DVT prophylaxis: Apixaban  Code Status: DNR limited Family Communication: None Disposition Plan: Status is: Inpatient Remains inpatient appropriate because: COPD exacerbation       Level of care: Med-Surg  Consultants:  None  Procedures:  None  Antimicrobials: Augmentin    Subjective: Seen and examined.  Resting comfortably in bed.  Shortness of breath improving.  Negative baseline.  Objective: Vitals:   02/14/23 2212 02/15/23 0510 02/15/23 0512 02/15/23 0815  BP: (!) 131/59 137/67 139/68 (!) 96/57  Pulse: 90 84 84 88  Resp: 17 17  20   Temp: 97.7 F (36.5 C) 97.9 F (36.6 C)  97.9 F (36.6 C)  TempSrc:      SpO2: 98%  95% 97%  Weight:      Height:        Intake/Output Summary (Last 24 hours) at 02/15/2023 1144 Last data filed at  02/14/2023 1550 Gross per 24 hour  Intake 240 ml  Output --  Net 240 ml   Filed Weights   02/11/23 1838  Weight: 79.4 kg    Examination:  General exam: No acute distress.  Sitting up in chair Respiratory system: Scattered wheeze.  No crackles.  No conversational  dyspnea.  3 L Cardiovascular system: S1-S2, regular rate, irregular rhythm, no murmurs, no pedal edema Gastrointestinal system: Soft, NT/ND, normal bowel sounds Central nervous system: Alert and oriented. No focal neurological deficits. Extremities: Symmetric 5 x 5 power. Skin: No rashes, lesions or ulcers Psychiatry: Judgement and insight appear normal. Mood & affect appropriate.     Data Reviewed: I have personally reviewed following labs and imaging studies  CBC: Recent Labs  Lab 02/11/23 1350 02/12/23 0531 02/15/23 0856  WBC 15.9* 16.4* 20.6*  NEUTROABS 10.3*  --  14.8*  HGB 11.2* 10.4* 9.9*  HCT 35.6* 32.9* 31.0*  MCV 89.0 87.5 85.6  PLT 281 265 298   Basic Metabolic Panel: Recent Labs  Lab 02/11/23 1350 02/12/23 0531 02/15/23 0856  NA 138 140 138  K 4.0 4.9 4.1  CL 103 104 101  CO2 21* 24 26  GLUCOSE 160* 174* 248*  BUN 29* 29* 38*  CREATININE 1.33* 1.35* 1.25*  CALCIUM  8.5* 8.1* 8.2*   GFR: Estimated Creatinine Clearance: 33.1 mL/min (A) (by C-G formula based on SCr of 1.25 mg/dL (H)). Liver Function Tests: Recent Labs  Lab 02/11/23 1350  AST 20  ALT 17  ALKPHOS 82  BILITOT 0.6  PROT 7.4  ALBUMIN  3.4*   No results for input(s): LIPASE, AMYLASE in the last 168 hours. No results for input(s): AMMONIA in the last 168 hours. Coagulation Profile: No results for input(s): INR, PROTIME in the last 168 hours. Cardiac Enzymes: No results for input(s): CKTOTAL, CKMB, CKMBINDEX, TROPONINI in the last 168 hours. BNP (last 3 results) No results for input(s): PROBNP in the last 8760 hours. HbA1C: No results for input(s): HGBA1C in the last 72 hours. CBG: No results for input(s): GLUCAP in the last 168 hours. Lipid Profile: No results for input(s): CHOL, HDL, LDLCALC, TRIG, CHOLHDL, LDLDIRECT in the last 72 hours. Thyroid  Function Tests: No results for input(s): TSH, T4TOTAL, FREET4, T3FREE, THYROIDAB in the  last 72 hours. Anemia Panel: No results for input(s): VITAMINB12, FOLATE, FERRITIN, TIBC, IRON , RETICCTPCT in the last 72 hours. Sepsis Labs: Recent Labs  Lab 02/12/23 0531  PROCALCITON <0.10    Recent Results (from the past 240 hours)  Resp panel by RT-PCR (RSV, Flu A&B, Covid) Anterior Nasal Swab     Status: None   Collection Time: 02/11/23  6:20 PM   Specimen: Anterior Nasal Swab  Result Value Ref Range Status   SARS Coronavirus 2 by RT PCR NEGATIVE NEGATIVE Final    Comment: (NOTE) SARS-CoV-2 target nucleic acids are NOT DETECTED.  The SARS-CoV-2 RNA is generally detectable in upper respiratory specimens during the acute phase of infection. The lowest concentration of SARS-CoV-2 viral copies this assay can detect is 138 copies/mL. A negative result does not preclude SARS-Cov-2 infection and should not be used as the sole basis for treatment or other patient management decisions. A negative result may occur with  improper specimen collection/handling, submission of specimen other than nasopharyngeal swab, presence of viral mutation(s) within the areas targeted by this assay, and inadequate number of viral copies(<138 copies/mL). A negative result must be combined with clinical observations, patient history, and epidemiological information. The expected result is Negative.  Fact Sheet for Patients:  bloggercourse.com  Fact Sheet for Healthcare Providers:  seriousbroker.it  This test is no t yet approved or cleared by the [place]  FDA and  has been authorized for detection and/or diagnosis of SARS-CoV-2 by FDA under an Emergency Use Authorization (EUA). This EUA will remain  in effect (meaning this test can be used) for the duration of the COVID-19 declaration under Section 564(b)(1) of the Act, 21 U.S.C.section 360bbb-3(b)(1), unless the authorization is terminated  or revoked sooner.       Influenza A  by PCR NEGATIVE NEGATIVE Final   Influenza B by PCR NEGATIVE NEGATIVE Final    Comment: (NOTE) The Xpert Xpress SARS-CoV-2/FLU/RSV plus assay is intended as an aid in the diagnosis of influenza from Nasopharyngeal swab specimens and should not be used as a sole basis for treatment. Nasal washings and aspirates are unacceptable for Xpert Xpress SARS-CoV-2/FLU/RSV testing.  Fact Sheet for Patients: bloggercourse.com  Fact Sheet for Healthcare Providers: seriousbroker.it  This test is not yet approved or cleared by the [place]  FDA and has been authorized for detection and/or diagnosis of SARS-CoV-2 by FDA under an Emergency Use Authorization (EUA). This EUA will remain in effect (meaning this test can be used) for the duration of the COVID-19 declaration under Section 564(b)(1) of the Act, 21 U.S.C. section 360bbb-3(b)(1), unless the authorization is terminated or revoked.     Resp Syncytial Virus by PCR NEGATIVE NEGATIVE Final    Comment: (NOTE) Fact Sheet for Patients: bloggercourse.com  Fact Sheet for Healthcare Providers: seriousbroker.it  This test is not yet approved or cleared by the [place]  FDA and has been authorized for detection and/or diagnosis of SARS-CoV-2 by FDA under an Emergency Use Authorization (EUA). This EUA will remain in effect (meaning this test can be used) for the duration of the COVID-19 declaration under Section 564(b)(1) of the Act, 21 U.S.C. section 360bbb-3(b)(1), unless the authorization is terminated or revoked.  Performed at [facility], [address] [place], [place] [address]   Respiratory (~20 pathogens) panel by PCR     Status: None   Collection Time: 02/11/23  8:38 PM   Specimen: Nasopharyngeal Swab; Respiratory  Result Value Ref Range Status   Adenovirus NOT DETECTED NOT DETECTED Final   Coronavirus 229E NOT  DETECTED NOT DETECTED Final    Comment: (NOTE) The Coronavirus on the Respiratory Panel, DOES NOT test for the novel  Coronavirus (2019 nCoV)    Coronavirus HKU1 NOT DETECTED NOT DETECTED Final   Coronavirus NL63 NOT DETECTED NOT DETECTED Final   Coronavirus OC43 NOT DETECTED NOT DETECTED Final   Metapneumovirus NOT DETECTED NOT DETECTED Final   Rhinovirus / Enterovirus NOT DETECTED NOT DETECTED Final   Influenza A NOT DETECTED NOT DETECTED Final   Influenza B NOT DETECTED NOT DETECTED Final   Parainfluenza Virus 1 NOT DETECTED NOT DETECTED Final   Parainfluenza Virus 2 NOT DETECTED NOT DETECTED Final   Parainfluenza Virus 3 NOT DETECTED NOT DETECTED Final   Parainfluenza Virus 4 NOT DETECTED NOT DETECTED Final   Respiratory Syncytial Virus NOT DETECTED NOT DETECTED Final   Bordetella pertussis NOT DETECTED NOT DETECTED Final   Bordetella Parapertussis NOT DETECTED NOT DETECTED Final   Chlamydophila pneumoniae NOT DETECTED NOT DETECTED Final   Mycoplasma pneumoniae NOT DETECTED NOT DETECTED Final    Comment: Performed at [facility], [address] [place], [place] [address]  Culture, blood (Routine X 2) w Reflex to ID Panel  Status: None (Preliminary result)   Collection Time: 02/11/23  9:46 PM   Specimen: BLOOD  Result Value Ref Range Status   Specimen Description BLOOD BLOOD RIGHT HAND  Final   Special Requests   Final    BOTTLES DRAWN AEROBIC AND ANAEROBIC Blood Culture results may not be optimal due to an excessive volume of blood received in culture bottles   Culture   Final    NO GROWTH 4 DAYS Performed at [facility], [address] [place], [place] [address]    Report Status PENDING  Incomplete  Culture, blood (Routine X 2) w Reflex to ID Panel     Status: None (Preliminary result)   Collection Time: 02/11/23  9:46 PM   Specimen: BLOOD  Result Value Ref Range Status   Specimen Description BLOOD BLOOD RIGHT HAND  Final   Special Requests   Final     BOTTLES DRAWN AEROBIC AND ANAEROBIC Blood Culture results may not be optimal due to an excessive volume of blood received in culture bottles   Culture   Final    NO GROWTH 4 DAYS Performed at [facility], [address], [place], [place] [address]    Report Status PENDING  Incomplete         Radiology Studies: No results found.       Scheduled Meds:  amLODipine   5 mg Oral BID   amoxicillin -clavulanate  1 tablet Oral Q12H   apixaban   5 mg Oral BID   arformoterol   15 mcg Nebulization BID   azithromycin   500 mg Oral QHS   busPIRone   5 mg Oral BID   escitalopram   10 mg Oral Daily   famotidine   20 mg Oral BID   gabapentin   300 mg Oral Daily   And   gabapentin   600 mg Oral QHS   ipratropium-albuterol   3 mL Nebulization TID   lamoTRIgine   100 mg Oral BID   leflunomide   20 mg Oral Daily   loratadine   10 mg Oral QHS   methocarbamol   500 mg Oral TID   methylPREDNISolone  (SOLU-MEDROL ) injection  40 mg Intravenous Daily   mirabegron  ER  25 mg Oral Daily   montelukast   10 mg Oral Daily   multivitamin  1 tablet Oral Daily   nebivolol   5 mg Oral BID   nystatin   5 mL Mouth/Throat QID   pantoprazole   40 mg Oral BID AC   pravastatin   20 mg Oral q1800   QUEtiapine   50 mg Oral QHS   sucralfate   1 g Oral BID   torsemide   20 mg Oral Daily   traZODone   50 mg Oral QHS   Continuous Infusions:     LOS: 2 days      Calvin KATHEE Robson, MD [facility]   If 7PM-7AM, please contact night-coverage  02/15/2023, 11:44 AM

## 2023-02-15 NOTE — Telephone Encounter (Signed)
 She is on Trelegy Ellipta and Pulmicort was discontinued previously.

## 2023-02-16 DIAGNOSIS — J441 Chronic obstructive pulmonary disease with (acute) exacerbation: Secondary | ICD-10-CM | POA: Diagnosis not present

## 2023-02-16 LAB — CULTURE, BLOOD (ROUTINE X 2)
Culture: NO GROWTH
Culture: NO GROWTH

## 2023-02-16 MED ORDER — IPRATROPIUM-ALBUTEROL 0.5-2.5 (3) MG/3ML IN SOLN
3.0000 mL | Freq: Two times a day (BID) | RESPIRATORY_TRACT | Status: DC
Start: 2023-02-16 — End: 2023-02-16

## 2023-02-16 MED ORDER — PREDNISONE 20 MG PO TABS: 40.0000 mg | ORAL_TABLET | Freq: Every day | ORAL | 0 refills | Status: AC

## 2023-02-16 NOTE — TOC Transition Note (Signed)
 Transition of Care [facility]) - Discharge Note   Patient Details  Name: Kerri Carter MRN: [id number] Date of Birth: 21-Jul-1940  [facility] CM/SW Contact:  Tynesia Harral A Kristyn Obyrne, RN Phone Number: 02/16/2023, 2:07 PM   Clinical Narrative:    Chart reviewed.  Noted that patient has orders for discharge today.    I have meet with [name] at bedside today.  Mrs.  All informs me that prior to admission she lived at home with her grandson, granddaughter and their 2 small children.  [name] informs me that she was able to get around her home with a rolling walker.  [name] reports that she has a BSC, electric wheel chair, manual wheelchair, an Rolator at home. Patient has home 02 via [employer].    I have informed [name] that PT has recommended Home Health PT/OT on discharge.  [name] reports that she agreeable to home health but would like to try [facility] or [facility].  She has requested a female therapist.    I have spoken with Darleene with [facility] and [facility] is able to accomdate the request.    Patient's grandson will pick patient up today and will also bring her portable 02 tank.  I have made staff nurse aware of the above information.    Final next level of care: Home w Home Health Services Barriers to Discharge: No Barriers Identified   Patient Goals and CMS Choice   CMS Medicare.gov Compare Post Acute Care list provided to:: Patient Choice offered to / list presented to : Patient      Discharge Placement                  Name of family member notified: Patient has notified her grandson Patient and family notified of of transfer: 02/16/23  Discharge Plan and Services Additional resources added to the After Visit Summary for                            [facility] Arranged: RN, PT, OT [facility] Agency: [facility] Care Date [facility] Agency Contacted: 02/16/23   Representative spoke with at [facility] Agency: Darleene  Social Drivers of Health (SDOH) Interventions SDOH  Screenings   Food Insecurity: No Food Insecurity (02/12/2023)  Housing: Low Risk  (02/12/2023)  Transportation Needs: No Transportation Needs (02/12/2023)  Utilities: Not At Risk (02/12/2023)  Alcohol  Screen: Low Risk  (09/22/2022)  Depression (PHQ2-9): Medium Risk (09/22/2022)  Financial Resource Strain: Low Risk  (09/22/2022)  Physical Activity: Inactive (09/22/2022)  Social Connections: Socially Isolated (02/12/2023)  Stress: No Stress Concern Present (09/22/2022)  Tobacco Use: Medium Risk (02/11/2023)     Readmission Risk Interventions    08/12/2022   11:05 AM 08/21/2021    3:19 PM 05/24/2021    2:35 PM  Readmission Risk Prevention Plan  Transportation Screening Complete Complete Complete  PCP or Specialist Appt within 3-5 Days   Complete  HRI or Home Care Consult   Complete  Social Work Consult for Recovery Care Planning/Counseling   Complete  Palliative Care Screening   Not Applicable  Medication Review Oceanographer) Complete Complete Complete  PCP or Specialist appointment within 3-5 days of discharge Complete    HRI or Home Care Consult Complete Complete   SW Recovery Care/Counseling Consult Complete Complete   Palliative Care Screening Not Applicable Not Applicable   Skilled Nursing Facility Not Applicable Not Applicable

## 2023-02-16 NOTE — Care Management Important Message (Signed)
 Important Message  Patient Details  Name: Kerri Carter MRN: [id number] Date of Birth: 1940/11/13   Important Message Given:  Yes - Medicare IM     Cristela Blue, CMA 02/16/2023, 9:17 AM

## 2023-02-16 NOTE — Discharge Summary (Signed)
 Physician Discharge Summary  Kerri POCH FMW:[id number] DOB: 04-09-1940 DOA: 02/11/2023  PCP: Hope Merle, MD  Admit date: 02/11/2023 Discharge date: 02/16/2023  Admitted From: Home Disposition:  Home with home health  Recommendations for Outpatient Follow-up:  Follow up with PCP in 1-2 weeks Follow-up outpatient pulmonary  Home Health: Yes PT OT Equipment/Devices: Oxygen  2L via [place]  Discharge Condition:Stable  CODE STATUS:DNR  Diet recommendation: Reg  Brief/Interim Summary:  83 y.o. female with medical history significant of ILD and COPD on 2-3 L oxygen , hypertension, hyperlipidemia, PVD, CAD, diastolic CHF, depression with anxiety, bipolar, CKD-3B, A-fib on Eliquis , OSA on CPAP, obesity, who presents with shortness of breath.   Patient states that she has progressively worsening shortness breath for more than 3 days.  She has cough with yellow-colored sputum production.  No chest pain, fever or chills.  No nausea, vomiting, diarrhea or abdominal pain.  No symptoms of UTI.   Discharge Diagnoses:  Active Problems:   COPD exacerbation (HCC)   Interstitial pulmonary disease (HCC)   Chronic diastolic CHF (congestive heart failure) (HCC)   Atrial fibrillation, chronic (HCC)   Essential hypertension   CAD (coronary artery disease)   Hyperlipidemia   Stage 3b chronic kidney disease (HCC)   Rheumatoid arthritis (HCC)   OSA (obstructive sleep apnea)   Bipolar disorder, current episode mixed, unspecified (HCC)   Depression with anxiety   Obesity (BMI 30-39.9)  COPD exacerbation (HCC) Chronic hypoxic respiratory failure Chest x-ray negative for acute infiltrate however patient continues to have cough productive of yellow sputum.  She is on 3 L nasal cannula.  COVID-negative.  Still with significant wheeze and dyspnea.   Plan: DC IV Solu-Medrol .  Start p.o. prednisone  40 mg daily x 4 additional days.  Can restart home prednisone  5 mg daily after completion of this course.  Resume  home bronchodilator regimen.  Completed a 5-day course of empiric antibiotics.  No antibiotics warranted on discharge.  Follow-up outpatient PCP and pulmonary.  Discharge Instructions  Discharge Instructions     Diet - low sodium heart healthy   Complete by: As directed    Increase activity slowly   Complete by: As directed       Allergies as of 02/16/2023       Reactions   Ceftin [cefuroxime Axetil] Anaphylaxis   Lisinopril Anaphylaxis   Sulfa Antibiotics Other (See Comments)   Face swelling   Sulfasalazine Anaphylaxis   Morphine  Other (See Comments)   Per patient, low blood pressure issues that requires action to raise it back up. Can take small infrequent doses   Xarelto [rivaroxaban] Other (See Comments)   Stomach burning, bleeding, and tar in stool   Adhesive [tape] Rash   Paper tape and tega derm OK   Antihistamines, Chlorpheniramine-type Other (See Comments)   Makes pt hyper   Antivert [meclizine Hcl] Other (See Comments)   Bladder will not empty   Aspirin  Other (See Comments)   Sulfasalazine allergy cross reacts   Contrast Media [iodinated Contrast Media] Rash   she is able to use betadine scrubs.   Decongestant [pseudoephedrine Hcl] Other (See Comments)   Makes pt hyper   Doxycycline Other (See Comments)   GI upset   Levaquin  [levofloxacin  In D5w] Rash   Polymyxin B  Other (See Comments)   Facial rash   Tetanus Toxoids Rash, Other (See Comments)   Fever and hot to touch at injection site        Medication List     PAUSE taking  these medications    predniSONE  5 MG tablet Wait to take this until: February 19, 2023 Commonly known as: DELTASONE  TAKE 1 TABLET BY MOUTH EVERY DAY WITH BREAKFAST You also have another medication with the same name that you may need to continue taking.       TAKE these medications    albuterol  108 (90 Base) MCG/ACT inhaler Commonly known as: Ventolin  HFA Inhale 2 puffs into the lungs every 6 (six) hours as needed for  wheezing or shortness of breath.   albuterol  (2.5 MG/3ML) 0.083% nebulizer solution Commonly known as: PROVENTIL  Take 3 mLs (2.5 mg total) by nebulization every 6 (six) hours as needed for wheezing or shortness of breath.   amLODipine  5 MG tablet Commonly known as: NORVASC  Take 1 tablet (5 mg total) by mouth daily. What changed: when to take this   apixaban  5 MG Tabs tablet Commonly known as: ELIQUIS  Take 5 mg by mouth 2 (two) times daily. What changed: Another medication with the same name was removed. Continue taking this medication, and follow the directions you see here.   busPIRone  5 MG tablet Commonly known as: BUSPAR  Take 1 tablet (5 mg total) by mouth 2 (two) times daily.   cetirizine  10 MG tablet Commonly known as: ZYRTEC  TAKE 1 TABLET BY MOUTH EVERYDAY AT BEDTIME   dicyclomine  10 MG capsule Commonly known as: BENTYL  TAKE 1 CAPSULE (10 MG TOTAL) BY MOUTH 2 (TWO) TIMES DAILY AS NEEDED FOR SPASMS.   Enbrel SureClick 50 MG/ML injection Generic drug: etanercept Inject 50 mg into the skin once a week. Takes on Fridays   escitalopram  10 MG tablet Commonly known as: LEXAPRO  Take 1 tablet (10 mg total) by mouth daily.   famotidine  20 MG tablet Commonly known as: PEPCID  TAKE 1 TABLET BY MOUTH EVERYDAY AT BEDTIME   gabapentin  300 MG capsule Commonly known as: NEURONTIN  Take 300 mg in the morning and 600 mg at night.   Gemtesa  75 MG Tabs Generic drug: Vibegron  Take 1 tablet (75 mg total) by mouth daily.   lamoTRIgine  100 MG tablet Commonly known as: LAMICTAL  TAKE 1 TAB 2 TIMES DAILY. FURTHER REFILLS NEW PSYCH FOR ALL PSYCH MEDS ONLY TEMP SUPPLY FROM PCP   leflunomide  20 MG tablet Commonly known as: ARAVA  Take 1 tablet (20 mg total) by mouth daily. What changed: Another medication with the same name was removed. Continue taking this medication, and follow the directions you see here.   lidocaine  5 % Commonly known as: Lidoderm  Place 1 patch onto the skin 2  (two) times daily as needed. Remove & Discard patch within 12 hours or as directed by MD   lovastatin  20 MG tablet Commonly known as: MEVACOR  Take 1 tablet (20 mg total) by mouth daily at 6 PM.   methocarbamol  500 MG tablet Commonly known as: ROBAXIN  Take 500 mg by mouth 3 (three) times daily.   mometasone  50 MCG/ACT nasal spray Commonly known as: NASONEX  USE 1 SPRAY TO EACH NOSTRIL TWICE A DAY   montelukast  10 MG tablet Commonly known as: SINGULAIR  TAKE 1 TABLET BY MOUTH EVERY DAY   multivitamin-lutein  Caps capsule Take 1 capsule by mouth at bedtime.   nebivolol  5 MG tablet Commonly known as: Bystolic  Take 1 tablet (5 mg total) by mouth in the morning and at bedtime. (Note dose changed from 1/2 10 mg bid to 5 mg bid)   Ohtuvayre  3 MG/2.5ML Susp Generic drug: Ensifentrine  Inhale 1 ampule into the lungs in the morning and at bedtime.  ondansetron  4 MG tablet Commonly known as: ZOFRAN  Take 1 tablet (4 mg total) by mouth every 6 (six) hours as needed for nausea.   OXYGEN  Inhale 3 L into the lungs continuous.   pantoprazole  40 MG tablet Commonly known as: PROTONIX  TAKE 1 TABLET BY MOUTH 2 TIMES DAILY 30 MIN BEFORE FOOD (NOTE REDUCTION IN FREQUENCY)   predniSONE  20 MG tablet Commonly known as: DELTASONE  Take 2 tablets (40 mg total) by mouth daily for 3 days. What changed: Another medication with the same name was paused. Ask your nurse or doctor if you should take this medication.   Prolia  60 MG/ML Sosy injection Generic drug: denosumab  Inject 60 mg into the skin every 6 (six) months.   QUEtiapine  25 MG tablet Commonly known as: SEROQUEL  TAKE 1 TABLET (25 MG TOTAL) BY MOUTH AT BEDTIME. AGAIN LAST FILL FURTHER REFILLS FROM PSYCHIATRY NO EXCEPTIONS   QUEtiapine  50 MG tablet Commonly known as: SEROQUEL  Take 50 mg by mouth at bedtime.   sucralfate  1 g tablet Commonly known as: CARAFATE  TAKE 1 TABLET BY MOUTH TWICE A DAY   torsemide  20 MG tablet Commonly known as:  DEMADEX  Take 2 tablets (40 mg total) by mouth daily.   traMADol  50 MG tablet Commonly known as: ULTRAM  Take 1 tablet (50 mg total) by mouth every 12 (twelve) hours as needed.   Trelegy Ellipta  100-62.5-25 MCG/ACT Aepb Generic drug: Fluticasone -Umeclidin-Vilant Inhale 1 puff into the lungs daily.        Allergies  Allergen Reactions   Ceftin [Cefuroxime Axetil] Anaphylaxis   Lisinopril Anaphylaxis   Sulfa Antibiotics Other (See Comments)    Face swelling   Sulfasalazine Anaphylaxis   Morphine  Other (See Comments)    Per patient, low blood pressure issues that requires action to raise it back up. Can take small infrequent doses   Xarelto [Rivaroxaban] Other (See Comments)    Stomach burning, bleeding, and tar in stool   Adhesive [Tape] Rash    Paper tape and tega derm OK   Antihistamines, Chlorpheniramine-Type Other (See Comments)    Makes pt hyper   Antivert [Meclizine Hcl] Other (See Comments)    Bladder will not empty   Aspirin  Other (See Comments)    Sulfasalazine allergy cross reacts   Contrast Media [Iodinated Contrast Media] Rash    she is able to use betadine scrubs.   Decongestant [Pseudoephedrine Hcl] Other (See Comments)    Makes pt hyper   Doxycycline Other (See Comments)    GI upset   Levaquin  [Levofloxacin  In D5w] Rash   Polymyxin B  Other (See Comments)    Facial rash   Tetanus Toxoids Rash and Other (See Comments)    Fever and hot to touch at injection site    Consultations: None   Procedures/Studies: DG Chest 2 View Result Date: 02/11/2023 CLINICAL DATA:  Shortness of breath. EXAM: CHEST - 2 VIEW COMPARISON:  08/12/2022 FINDINGS: The heart size and mediastinal contours are within normal limits. Scarring is again seen in the right upper lobe with several old right rib fracture deformities. The lungs are otherwise clear. Bilateral shoulder prostheses again noted. IMPRESSION: No active cardiopulmonary disease. Stable right upper lobe scarring and old  right rib fracture deformities. Electronically Signed   By: Norleen DELENA Kil M.D.   On: 02/11/2023 15:01      Subjective: Seen and examined on the day of discharge.  Stable no distress.  Back to baseline respiratory status.  Stable for discharge home.  Discharge Exam: Vitals:   02/16/23  0730 02/16/23 0740  BP:  (!) 189/118  Pulse:  81  Resp:  16  Temp:  (!) 97.4 F (36.3 C)  SpO2: 98% 98%   Vitals:   02/15/23 2045 02/16/23 0444 02/16/23 0730 02/16/23 0740  BP:  135/65  (!) 189/118  Pulse:  73  81  Resp:  18  16  Temp:  (!) 97.5 F (36.4 C)  (!) 97.4 F (36.3 C)  TempSrc:      SpO2: 96% 100% 98% 98%  Weight:      Height:        General: Pt is alert, awake, not in acute distress Cardiovascular: RRR, S1/S2 +, no rubs, no gallops Respiratory: CTA bilaterally, no wheezing, no rhonchi Abdominal: Soft, NT, ND, bowel sounds + Extremities: no edema, no cyanosis    The results of significant diagnostics from this hospitalization (including imaging, microbiology, ancillary and laboratory) are listed below for reference.     Microbiology: Recent Results (from the past 240 hours)  Resp panel by RT-PCR (RSV, Flu A&B, Covid) Anterior Nasal Swab     Status: None   Collection Time: 02/11/23  6:20 PM   Specimen: Anterior Nasal Swab  Result Value Ref Range Status   SARS Coronavirus 2 by RT PCR NEGATIVE NEGATIVE Final    Comment: (NOTE) SARS-CoV-2 target nucleic acids are NOT DETECTED.  The SARS-CoV-2 RNA is generally detectable in upper respiratory specimens during the acute phase of infection. The lowest concentration of SARS-CoV-2 viral copies this assay can detect is 138 copies/mL. A negative result does not preclude SARS-Cov-2 infection and should not be used as the sole basis for treatment or other patient management decisions. A negative result may occur with  improper specimen collection/handling, submission of specimen other than nasopharyngeal swab, presence of viral  mutation(s) within the areas targeted by this assay, and inadequate number of viral copies(<138 copies/mL). A negative result must be combined with clinical observations, patient history, and epidemiological information. The expected result is Negative.  Fact Sheet for Patients:  bloggercourse.com  Fact Sheet for Healthcare Providers:  seriousbroker.it  This test is no t yet approved or cleared by the [place]  FDA and  has been authorized for detection and/or diagnosis of SARS-CoV-2 by FDA under an Emergency Use Authorization (EUA). This EUA will remain  in effect (meaning this test can be used) for the duration of the COVID-19 declaration under Section 564(b)(1) of the Act, 21 U.S.C.section 360bbb-3(b)(1), unless the authorization is terminated  or revoked sooner.       Influenza A by PCR NEGATIVE NEGATIVE Final   Influenza B by PCR NEGATIVE NEGATIVE Final    Comment: (NOTE) The Xpert Xpress SARS-CoV-2/FLU/RSV plus assay is intended as an aid in the diagnosis of influenza from Nasopharyngeal swab specimens and should not be used as a sole basis for treatment. Nasal washings and aspirates are unacceptable for Xpert Xpress SARS-CoV-2/FLU/RSV testing.  Fact Sheet for Patients: bloggercourse.com  Fact Sheet for Healthcare Providers: seriousbroker.it  This test is not yet approved or cleared by the [place]  FDA and has been authorized for detection and/or diagnosis of SARS-CoV-2 by FDA under an Emergency Use Authorization (EUA). This EUA will remain in effect (meaning this test can be used) for the duration of the COVID-19 declaration under Section 564(b)(1) of the Act, 21 U.S.C. section 360bbb-3(b)(1), unless the authorization is terminated or revoked.     Resp Syncytial Virus by PCR NEGATIVE NEGATIVE Final    Comment: (NOTE) Fact Sheet for  Patients: bloggercourse.com  Fact Sheet for Healthcare Providers: seriousbroker.it  This test is not yet approved or cleared by the [place]  FDA and has been authorized for detection and/or diagnosis of SARS-CoV-2 by FDA under an Emergency Use Authorization (EUA). This EUA will remain in effect (meaning this test can be used) for the duration of the COVID-19 declaration under Section 564(b)(1) of the Act, 21 U.S.C. section 360bbb-3(b)(1), unless the authorization is terminated or revoked.  Performed at [facility], [address] [place], [place] [address]   Respiratory (~20 pathogens) panel by PCR     Status: None   Collection Time: 02/11/23  8:38 PM   Specimen: Nasopharyngeal Swab; Respiratory  Result Value Ref Range Status   Adenovirus NOT DETECTED NOT DETECTED Final   Coronavirus 229E NOT DETECTED NOT DETECTED Final    Comment: (NOTE) The Coronavirus on the Respiratory Panel, DOES NOT test for the novel  Coronavirus (2019 nCoV)    Coronavirus HKU1 NOT DETECTED NOT DETECTED Final   Coronavirus NL63 NOT DETECTED NOT DETECTED Final   Coronavirus OC43 NOT DETECTED NOT DETECTED Final   Metapneumovirus NOT DETECTED NOT DETECTED Final   Rhinovirus / Enterovirus NOT DETECTED NOT DETECTED Final   Influenza A NOT DETECTED NOT DETECTED Final   Influenza B NOT DETECTED NOT DETECTED Final   Parainfluenza Virus 1 NOT DETECTED NOT DETECTED Final   Parainfluenza Virus 2 NOT DETECTED NOT DETECTED Final   Parainfluenza Virus 3 NOT DETECTED NOT DETECTED Final   Parainfluenza Virus 4 NOT DETECTED NOT DETECTED Final   Respiratory Syncytial Virus NOT DETECTED NOT DETECTED Final   Bordetella pertussis NOT DETECTED NOT DETECTED Final   Bordetella Parapertussis NOT DETECTED NOT DETECTED Final   Chlamydophila pneumoniae NOT DETECTED NOT DETECTED Final   Mycoplasma pneumoniae NOT DETECTED NOT DETECTED Final    Comment: Performed at  [facility], [address] [place], [place] [address]  Culture, blood (Routine X 2) w Reflex to ID Panel     Status: None   Collection Time: 02/11/23  9:46 PM   Specimen: BLOOD  Result Value Ref Range Status   Specimen Description BLOOD BLOOD RIGHT HAND  Final   Special Requests   Final    BOTTLES DRAWN AEROBIC AND ANAEROBIC Blood Culture results may not be optimal due to an excessive volume of blood received in culture bottles   Culture   Final    NO GROWTH 5 DAYS Performed at [facility], [address] [place], [place] [address]    Report Status 02/16/2023 FINAL  Final  Culture, blood (Routine X 2) w Reflex to ID Panel     Status: None   Collection Time: 02/11/23  9:46 PM   Specimen: BLOOD  Result Value Ref Range Status   Specimen Description BLOOD BLOOD RIGHT HAND  Final   Special Requests   Final    BOTTLES DRAWN AEROBIC AND ANAEROBIC Blood Culture results may not be optimal due to an excessive volume of blood received in culture bottles   Culture   Final    NO GROWTH 5 DAYS Performed at [facility], [address] [place], [place] [address]    Report Status 02/16/2023 FINAL  Final     Labs: BNP (last 3 results) Recent Labs    03/30/22 0908 07/16/22 1437 02/11/23 1350  BNP 933.7* 446.2* 195.9*   Basic Metabolic Panel: Recent Labs  Lab 02/11/23 1350 02/12/23 0531 02/15/23 0856  NA 138 140 138  K 4.0 4.9 4.1  CL 103 104 101  CO2 21* 24 26  GLUCOSE 160* 174* 248*  BUN 29* 29* 38*  CREATININE 1.33* 1.35* 1.25*  CALCIUM  8.5* 8.1* 8.2*   Liver Function Tests: Recent Labs  Lab 02/11/23 1350  AST 20  ALT 17  ALKPHOS 82  BILITOT 0.6  PROT 7.4  ALBUMIN  3.4*   No results for input(s): LIPASE, AMYLASE in the last 168 hours. No results for input(s): AMMONIA in the last 168 hours. CBC: Recent Labs  Lab 02/11/23 1350 02/12/23 0531 02/15/23 0856  WBC 15.9* 16.4* 20.6*  NEUTROABS 10.3*  --  14.8*  HGB 11.2* 10.4* 9.9*   HCT 35.6* 32.9* 31.0*  MCV 89.0 87.5 85.6  PLT 281 265 298   Cardiac Enzymes: No results for input(s): CKTOTAL, CKMB, CKMBINDEX, TROPONINI in the last 168 hours. BNP: Invalid input(s): POCBNP CBG: No results for input(s): GLUCAP in the last 168 hours. D-Dimer No results for input(s): DDIMER in the last 72 hours. Hgb A1c No results for input(s): HGBA1C in the last 72 hours. Lipid Profile No results for input(s): CHOL, HDL, LDLCALC, TRIG, CHOLHDL, LDLDIRECT in the last 72 hours. Thyroid  function studies No results for input(s): TSH, T4TOTAL, T3FREE, THYROIDAB in the last 72 hours.  Invalid input(s): FREET3 Anemia work up No results for input(s): VITAMINB12, FOLATE, FERRITIN, TIBC, IRON , RETICCTPCT in the last 72 hours. Urinalysis    Component Value Date/Time   COLORURINE YELLOW (A) 08/11/2022 0205   APPEARANCEUR HAZY (A) 08/11/2022 0205   APPEARANCEUR Clear 10/12/2017 1410   LABSPEC 1.013 08/11/2022 0205   LABSPEC 1.003 11/24/2013 2117   PHURINE 6.0 08/11/2022 0205   GLUCOSEU NEGATIVE 08/11/2022 0205   GLUCOSEU Negative 11/24/2013 2117   HGBUR NEGATIVE 08/11/2022 0205   BILIRUBINUR NEGATIVE 08/11/2022 0205   BILIRUBINUR Negative 10/12/2017 1410   BILIRUBINUR Negative 11/24/2013 2117   KETONESUR NEGATIVE 08/11/2022 0205   PROTEINUR NEGATIVE 08/11/2022 0205   NITRITE NEGATIVE 08/11/2022 0205   LEUKOCYTESUR NEGATIVE 08/11/2022 0205   LEUKOCYTESUR Negative 11/24/2013 2117   Sepsis Labs Recent Labs  Lab 02/11/23 1350 02/12/23 0531 02/15/23 0856  WBC 15.9* 16.4* 20.6*   Microbiology Recent Results (from the past 240 hours)  Resp panel by RT-PCR (RSV, Flu A&B, Covid) Anterior Nasal Swab     Status: None   Collection Time: 02/11/23  6:20 PM   Specimen: Anterior Nasal Swab  Result Value Ref Range Status   SARS Coronavirus 2 by RT PCR NEGATIVE NEGATIVE Final    Comment: (NOTE) SARS-CoV-2 target nucleic acids are NOT  DETECTED.  The SARS-CoV-2 RNA is generally detectable in upper respiratory specimens during the acute phase of infection. The lowest concentration of SARS-CoV-2 viral copies this assay can detect is 138 copies/mL. A negative result does not preclude SARS-Cov-2 infection and should not be used as the sole basis for treatment or other patient management decisions. A negative result may occur with  improper specimen collection/handling, submission of specimen other than nasopharyngeal swab, presence of viral mutation(s) within the areas targeted by this assay, and inadequate number of viral copies(<138 copies/mL). A negative result must be combined with clinical observations, patient history, and epidemiological information. The expected result is Negative.  Fact Sheet for Patients:  bloggercourse.com  Fact Sheet for Healthcare Providers:  seriousbroker.it  This test is no t yet approved or cleared by the [place]  FDA and  has been authorized for detection and/or diagnosis of SARS-CoV-2 by FDA under an Emergency Use Authorization (EUA).  This EUA will remain  in effect (meaning this test can be used) for the duration of the COVID-19 declaration under Section 564(b)(1) of the Act, 21 U.S.C.section 360bbb-3(b)(1), unless the authorization is terminated  or revoked sooner.       Influenza A by PCR NEGATIVE NEGATIVE Final   Influenza B by PCR NEGATIVE NEGATIVE Final    Comment: (NOTE) The Xpert Xpress SARS-CoV-2/FLU/RSV plus assay is intended as an aid in the diagnosis of influenza from Nasopharyngeal swab specimens and should not be used as a sole basis for treatment. Nasal washings and aspirates are unacceptable for Xpert Xpress SARS-CoV-2/FLU/RSV testing.  Fact Sheet for Patients: bloggercourse.com  Fact Sheet for Healthcare Providers: seriousbroker.it  This test is not yet  approved or cleared by the [place]  FDA and has been authorized for detection and/or diagnosis of SARS-CoV-2 by FDA under an Emergency Use Authorization (EUA). This EUA will remain in effect (meaning this test can be used) for the duration of the COVID-19 declaration under Section 564(b)(1) of the Act, 21 U.S.C. section 360bbb-3(b)(1), unless the authorization is terminated or revoked.     Resp Syncytial Virus by PCR NEGATIVE NEGATIVE Final    Comment: (NOTE) Fact Sheet for Patients: bloggercourse.com  Fact Sheet for Healthcare Providers: seriousbroker.it  This test is not yet approved or cleared by the [place]  FDA and has been authorized for detection and/or diagnosis of SARS-CoV-2 by FDA under an Emergency Use Authorization (EUA). This EUA will remain in effect (meaning this test can be used) for the duration of the COVID-19 declaration under Section 564(b)(1) of the Act, 21 U.S.C. section 360bbb-3(b)(1), unless the authorization is terminated or revoked.  Performed at [facility], [address] [place], [place] [address]   Respiratory (~20 pathogens) panel by PCR     Status: None   Collection Time: 02/11/23  8:38 PM   Specimen: Nasopharyngeal Swab; Respiratory  Result Value Ref Range Status   Adenovirus NOT DETECTED NOT DETECTED Final   Coronavirus 229E NOT DETECTED NOT DETECTED Final    Comment: (NOTE) The Coronavirus on the Respiratory Panel, DOES NOT test for the novel  Coronavirus (2019 nCoV)    Coronavirus HKU1 NOT DETECTED NOT DETECTED Final   Coronavirus NL63 NOT DETECTED NOT DETECTED Final   Coronavirus OC43 NOT DETECTED NOT DETECTED Final   Metapneumovirus NOT DETECTED NOT DETECTED Final   Rhinovirus / Enterovirus NOT DETECTED NOT DETECTED Final   Influenza A NOT DETECTED NOT DETECTED Final   Influenza B NOT DETECTED NOT DETECTED Final   Parainfluenza Virus 1 NOT DETECTED NOT DETECTED  Final   Parainfluenza Virus 2 NOT DETECTED NOT DETECTED Final   Parainfluenza Virus 3 NOT DETECTED NOT DETECTED Final   Parainfluenza Virus 4 NOT DETECTED NOT DETECTED Final   Respiratory Syncytial Virus NOT DETECTED NOT DETECTED Final   Bordetella pertussis NOT DETECTED NOT DETECTED Final   Bordetella Parapertussis NOT DETECTED NOT DETECTED Final   Chlamydophila pneumoniae NOT DETECTED NOT DETECTED Final   Mycoplasma pneumoniae NOT DETECTED NOT DETECTED Final    Comment: Performed at [facility], [address], [place], [place] [address]  Culture, blood (Routine X 2) w Reflex to ID Panel     Status: None   Collection Time: 02/11/23  9:46 PM   Specimen: BLOOD  Result Value Ref Range Status   Specimen Description BLOOD BLOOD RIGHT HAND  Final   Special Requests   Final    BOTTLES DRAWN AEROBIC AND ANAEROBIC Blood  Culture results may not be optimal due to an excessive volume of blood received in culture bottles   Culture   Final    NO GROWTH 5 DAYS Performed at [facility], [address] [place], [place] [address]    Report Status 02/16/2023 FINAL  Final  Culture, blood (Routine X 2) w Reflex to ID Panel     Status: None   Collection Time: 02/11/23  9:46 PM   Specimen: BLOOD  Result Value Ref Range Status   Specimen Description BLOOD BLOOD RIGHT HAND  Final   Special Requests   Final    BOTTLES DRAWN AEROBIC AND ANAEROBIC Blood Culture results may not be optimal due to an excessive volume of blood received in culture bottles   Culture   Final    NO GROWTH 5 DAYS Performed at [facility], [address] [place], [place] [address]    Report Status 02/16/2023 FINAL  Final     Time coordinating discharge: Over 30 minutes  SIGNED:   Calvin KATHEE Robson, MD  [facility] 02/16/2023, 12:24 PM Pager   If 7PM-7AM, please contact night-coverage

## 2023-02-16 NOTE — Plan of Care (Signed)

## 2023-02-16 NOTE — Consult Note (Signed)
 [facility] Liaison Consult Note   02/16/2023  Kerri Carter 12/11/1940 [id number]  Primary Care Provider:   Dr. Glenys Ferrari ([facility]).  Patient is currently active with Care Management for chronic disease management services.  Patient has been engaged by presenter, broadcasting.  Our community based plan of care has focused on disease management and community resource support.   PLAN: Pt discharged today with [facility] services with [facility]. Liaison will collaborate with the [facility] with [facility] concerning post hospital disposition.  Patient will receive a post hospital call and will be evaluated for assessments and disease process education.   Of note, Care Management services does not replace or interfere with any services that are needed or arranged by inpatient [facility] care management team.   For additional questions or referrals please contact:    Olam Ku, RN, [facility] Liaison [place]   [facility], Population Health Office Hours MTWF  8:00 am-6:00 pm Direct Dial: [phone] mobile [phone] [Office toll free line] Office Hours are M-F 8:30 - 5 pm [email or web address]

## 2023-02-17 ENCOUNTER — Telehealth: Payer: Self-pay

## 2023-02-17 ENCOUNTER — Telehealth: Payer: Self-pay | Admitting: Pulmonary Disease

## 2023-02-17 DIAGNOSIS — J4489 Other specified chronic obstructive pulmonary disease: Secondary | ICD-10-CM

## 2023-02-17 DIAGNOSIS — J9611 Chronic respiratory failure with hypoxia: Secondary | ICD-10-CM

## 2023-02-17 NOTE — Transitions of Care (Post Inpatient/ED Visit) (Signed)
   02/17/2023  Name: Kerri Carter MRN: [id number] DOB: [birth year]  Today's TOC FU Call Status: Today's TOC FU Call Status:: Unsuccessful Call (1st Attempt) Unsuccessful Call (1st Attempt) Date: 02/17/23  Attempted to reach the patient regarding the most recent Inpatient/ED visit.  Follow Up Plan: Additional outreach attempts will be made to reach the patient to complete the Transitions of Care (Post Inpatient/ED visit) call.   Gareld June, RN Medical illustrator [facility] [phone]

## 2023-02-17 NOTE — Telephone Encounter (Signed)
 Patient states needs order for Nebulizer mask. Needs more than one. States [facility] does not have the order. Needs to be delivered to home. Patient phone number is [phone].

## 2023-02-17 NOTE — Telephone Encounter (Signed)
 I spoke with the patient. She would like to get the nebulizer mask from [facility].  I have placed an order for a neb mask to [facility]. The patient is aware.  Nothing further needed.

## 2023-02-18 ENCOUNTER — Telehealth: Payer: Self-pay | Admitting: Pulmonary Disease

## 2023-02-18 ENCOUNTER — Telehealth: Payer: Self-pay

## 2023-02-18 NOTE — Telephone Encounter (Signed)
Patient states that [facility] still has not receive the prescription for her mask. She has not had her breathing treatment in 2 days.

## 2023-02-18 NOTE — Telephone Encounter (Signed)
Synetta Fail has sent an urgent message to [facility] asking them about delivery.

## 2023-02-18 NOTE — Transitions of Care (Post Inpatient/ED Visit) (Signed)
   02/18/2023  Name: Kerri Carter MRN: [id number] DOB: 03-24-1940  Today's TOC FU Call Status: Today's TOC FU Call Status:: Unsuccessful Call (2nd Attempt) Unsuccessful Call (2nd Attempt) Date: 02/18/23  Attempted to reach the patient regarding the most recent Inpatient/ED visit. Left HIPAA compliant VM on personal cell number list as per EMR DPR.   Follow Up Plan: Additional outreach attempts will be made to reach the patient to complete the Transitions of Care (Post Inpatient/ED visit) call.   Gabriel Cirri MSN, RN Care Coordinator [facility] Office Hours Wed/Thur  8:00 am-6:00 pm Direct Dial: [phone] Main Phone [phone]  Fax: [phone]

## 2023-02-19 ENCOUNTER — Encounter: Payer: Self-pay | Admitting: Pulmonary Disease

## 2023-02-19 ENCOUNTER — Telehealth: Payer: Self-pay

## 2023-02-19 ENCOUNTER — Ambulatory Visit: Payer: Medicare PPO | Admitting: Pulmonary Disease

## 2023-02-19 VITALS — BP 120/70 | HR 88 | Temp 98.0°F | Ht 59.0 in | Wt 175.0 lb

## 2023-02-19 DIAGNOSIS — J9611 Chronic respiratory failure with hypoxia: Secondary | ICD-10-CM | POA: Diagnosis not present

## 2023-02-19 DIAGNOSIS — M0579 Rheumatoid arthritis with rheumatoid factor of multiple sites without organ or systems involvement: Secondary | ICD-10-CM | POA: Diagnosis not present

## 2023-02-19 DIAGNOSIS — J8489 Other specified interstitial pulmonary diseases: Secondary | ICD-10-CM | POA: Diagnosis not present

## 2023-02-19 DIAGNOSIS — D631 Anemia in chronic kidney disease: Secondary | ICD-10-CM | POA: Diagnosis not present

## 2023-02-19 DIAGNOSIS — J4489 Other specified chronic obstructive pulmonary disease: Secondary | ICD-10-CM | POA: Diagnosis not present

## 2023-02-19 DIAGNOSIS — G4733 Obstructive sleep apnea (adult) (pediatric): Secondary | ICD-10-CM | POA: Diagnosis not present

## 2023-02-19 DIAGNOSIS — I130 Hypertensive heart and chronic kidney disease with heart failure and stage 1 through stage 4 chronic kidney disease, or unspecified chronic kidney disease: Secondary | ICD-10-CM | POA: Diagnosis not present

## 2023-02-19 DIAGNOSIS — J441 Chronic obstructive pulmonary disease with (acute) exacerbation: Secondary | ICD-10-CM | POA: Diagnosis not present

## 2023-02-19 DIAGNOSIS — J9621 Acute and chronic respiratory failure with hypoxia: Secondary | ICD-10-CM | POA: Diagnosis not present

## 2023-02-19 DIAGNOSIS — J849 Interstitial pulmonary disease, unspecified: Secondary | ICD-10-CM | POA: Diagnosis not present

## 2023-02-19 DIAGNOSIS — N1832 Chronic kidney disease, stage 3b: Secondary | ICD-10-CM | POA: Diagnosis not present

## 2023-02-19 DIAGNOSIS — J45901 Unspecified asthma with (acute) exacerbation: Secondary | ICD-10-CM | POA: Diagnosis not present

## 2023-02-19 DIAGNOSIS — I5042 Chronic combined systolic (congestive) and diastolic (congestive) heart failure: Secondary | ICD-10-CM | POA: Diagnosis not present

## 2023-02-19 NOTE — Patient Instructions (Signed)
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 We discussed your recent hospitalization due to a COPD exacerbation, which was likely caused by a lack of functioning masks for your nebulizer. You have not had any breathing treatments since your discharge due to supply issues. You also reported feeling weak and experiencing episodes of diarrhea and nausea, which may have led to dehydration.  YOUR PLAN:  -INTERSTITIAL LUNG DISEASE ASSOCIATED WITH RHEUMATOID ARTHRITIS: Interstitial lung disease is a group of lung disorders that cause scarring of lung tissues, often associated with rheumatoid arthritis. You did not report any new symptoms or wheezing. We will continue with your current management plan and have a follow-up in 3-4 weeks.  -CHRONIC ASTHMATIC BRONCHITIS: Chronic asthmatic bronchitis is a long-term inflammation of the airways that causes breathing difficulties. You were recently hospitalized due to a COPD exacerbation, likely caused by issues with your nebulizer and mask supplies. You are currently using Trelegy and Ohtuvayre. We discussed the importance of using a mouthpiece for your nebulizer treatments to ensure the medication is effective. We also recommended staying hydrated with Pedialyte or Gatorade to replenish electrolytes. A new nebulizer will be ordered through [facility]. Please follow up in 3-4 weeks.  -CHRONIC DIASTOLIC DYSFUNCTION: Chronic diastolic dysfunction is a condition where the heart has difficulty relaxing and filling with blood. Your condition is well-managed, and you did not report any new symptoms. We will continue with your current management plan and have a follow-up in 3-4 weeks.  INSTRUCTIONS:  Please follow up in 3-4 weeks for all your conditions. Ensure you use a mouthpiece for your nebulizer treatments and stay  hydrated with Pedialyte or Gatorade. A new nebulizer will be ordered through [facility].

## 2023-02-19 NOTE — Transitions of Care (Post Inpatient/ED Visit) (Signed)
02/19/2023  Name: Kerri Carter MRN: [id number] DOB: 1940-05-02  Today's TOC FU Call Status: Today's TOC FU Call Status:: Successful TOC FU Call Completed TOC FU Call Complete Date: 02/19/23 Patient's Name and Date of Birth confirmed.  Transition Care Management Follow-up Telephone Call Date of Discharge: 02/16/23 Discharge Facility: [facility]) Type of Discharge: Inpatient Admission Primary Inpatient Discharge Diagnosis:: COPD Exacerbation How have you been since you were released from the hospital?: Better (Patient's COPD is better but she thinks she got food poisoning and has had diarrhia) Any questions or concerns?: No  Items Reviewed: Did you receive and understand the discharge instructions provided?: Yes Medications obtained,verified, and reconciled?: Yes (Medications Reviewed) Any new allergies since your discharge?: No Dietary orders reviewed?: No Do you have support at home?: Yes People in Home: grandchild(ren) Name of Support/Comfort Primary Source: Patient lives with her granson and his wife  Medications Reviewed Today: Medications Reviewed Today     Reviewed by Jodelle Gross, RN (Case Manager) on 02/19/23 at 1544  Med List Status: <None>   Medication Order Taking? Sig Documenting Provider Last Dose Status Informant  albuterol (PROVENTIL) (2.5 MG/3ML) 0.083% nebulizer solution [id number] Yes Take 3 mLs (2.5 mg total) by nebulization every 6 (six) hours as needed for wheezing or shortness of breath. Salena Saner, MD Taking Active Self  albuterol (VENTOLIN HFA) 108 (90 Base) MCG/ACT inhaler [id number] Yes Inhale 2 puffs into the lungs every 6 (six) hours as needed for wheezing or shortness of breath. McLean-Scocuzza, Pasty Spillers, MD Taking Active Family Member, Pharmacy Records, Self           Med Note Ashok Croon Jul 16, 2022 10:00 PM)    amLODipine (NORVASC) 5 MG tablet [id number] Yes Take 1 tablet (5 mg total) by mouth  daily.  Patient taking differently: Take 5 mg by mouth in the morning and at bedtime.   McLean-Scocuzza, Pasty Spillers, MD Taking Active Family Member, Pharmacy Records, Self           Med Note Penne Lash, [address] Jun 04, 2022 11:02 AM)    apixaban (ELIQUIS) 5 MG TABS tablet [id number] Yes Take 5 mg by mouth 2 (two) times daily. [provider] Taking Active Self  busPIRone (BUSPAR) 5 MG tablet [id number] Yes Take 1 tablet (5 mg total) by mouth 2 (two) times daily. Lurene Shadow, MD Taking Active Family Member, Pharmacy Records, Self           Med Note Vertell Novak Jun 04, 2022 11:03 AM)    cetirizine (ZYRTEC) 10 MG tablet [id number] Yes TAKE 1 TABLET BY MOUTH EVERYDAY AT BEDTIME Salena Saner, MD Taking Active Pharmacy Records, Family Member, Self  dicyclomine (BENTYL) 10 MG capsule [id number] Yes TAKE 1 CAPSULE (10 MG TOTAL) BY MOUTH 2 (TWO) TIMES DAILY AS NEEDED FOR SPASMS. Wyline Mood, MD Taking Active Self  ENBREL SURECLICK 50 MG/ML injection [id number] Yes Inject 50 mg into the skin once a week. Takes on Fridays [provider] Taking Active Family Member, Pharmacy Records, Self           Med Note Vertell Novak Jun 04, 2022 11:06 AM)    escitalopram (LEXAPRO) 10 MG tablet [id number] Yes Take 1 tablet (10 mg total) by mouth daily. McLean-Scocuzza, Pasty Spillers, MD Taking Active Family Member, Pharmacy Records, Self           Med Note Maltby,  [address] Jun 04, 2022 11:06 AM)    famotidine (PEPCID) 20 MG tablet [id number] Yes TAKE 1 TABLET BY MOUTH EVERYDAY AT BEDTIME Wyline Mood, MD Taking Active Self  Fluticasone-Umeclidin-[facility] ELLIPTA) 100-62.5-25 MCG/ACT AEPB [id number] Yes Inhale 1 puff into the lungs daily. Salena Saner, MD Taking Active Self  gabapentin (NEURONTIN) 300 MG capsule [id number] Yes Take 300 mg in the morning and 600 mg at night. Dana Allan, MD Taking Active Self  lamoTRIgine (LAMICTAL) 100 MG tablet [id number] Yes  TAKE 1 TAB 2 TIMES DAILY. FURTHER REFILLS NEW PSYCH FOR ALL PSYCH MEDS ONLY TEMP SUPPLY FROM PCP McLean-Scocuzza, Pasty Spillers, MD Taking Active Family Member, Pharmacy Records, Self  leflunomide (ARAVA) 20 MG tablet [id number] Yes Take 1 tablet (20 mg total) by mouth daily. McLean-Scocuzza, Pasty Spillers, MD Taking Active Family Member, Pharmacy Records, Self  lidocaine (LIDODERM) 5 % [id number] Yes Place 1 patch onto the skin 2 (two) times daily as needed. Remove & Discard patch within 12 hours or as directed by MD McLean-Scocuzza, Pasty Spillers, MD Taking Active Family Member, Pharmacy Records, Self           Med Note Penne Lash, [address] Jun 04, 2022 11:09 AM)    lovastatin (MEVACOR) 20 MG tablet [id number] Yes Take 1 tablet (20 mg total) by mouth daily at 6 PM. Dana Allan, MD Taking Active Self  methocarbamol (ROBAXIN) 500 MG tablet [id number] Yes Take 500 mg by mouth 3 (three) times daily. [provider] Taking Active Self  mometasone (NASONEX) 50 MCG/ACT nasal spray [id number] Yes USE 1 SPRAY TO EACH NOSTRIL TWICE A DAY Salena Saner, MD Taking Active Self  montelukast (SINGULAIR) 10 MG tablet [id number] Yes TAKE 1 TABLET BY MOUTH EVERY DAY Salena Saner, MD Taking Active Self  multivitamin-lutein [facility]) CAPS capsule [id number] Yes Take 1 capsule by mouth at bedtime. [provider] Taking Active Family Member, Pharmacy Records, Self  nebivolol (BYSTOLIC) 5 MG tablet [id number] Yes Take 1 tablet (5 mg total) by mouth in the morning and at bedtime. (Note dose changed from 1/2 10 mg bid to 5 mg bid) McLean-Scocuzza, Pasty Spillers, MD Taking Active Family Member, Pharmacy Records, Self  OHTUVAYRE 3 MG/2.5ML SUSP [id number] Yes Inhale 1 ampule into the lungs in the morning and at bedtime. [provider] Taking Active Self  ondansetron (ZOFRAN) 4 MG tablet [id number] Yes Take 1 tablet (4 mg total) by mouth every 6 (six) hours as needed for nausea. Pennie Banter, DO Taking  Active Self  OXYGEN [id number] Yes Inhale 3 L into the lungs continuous. [provider] Taking Active Family Member, Pharmacy Records, Self  pantoprazole (PROTONIX) 40 MG tablet [id number] Yes TAKE 1 TABLET BY MOUTH 2 TIMES DAILY 30 MIN BEFORE FOOD (NOTE REDUCTION IN FREQUENCY) Dana Allan, MD Taking Active   predniSONE (DELTASONE) 20 MG tablet [id number] Yes Take 2 tablets (40 mg total) by mouth daily for 3 days. Tresa Moore, MD Taking Active   predniSONE (DELTASONE) 5 MG tablet [id number] Yes TAKE 1 TABLET BY MOUTH EVERY DAY WITH BREAKFAST Salena Saner, MD Taking Active Self  PROLIA 60 MG/ML SOSY injection [id number] Yes Inject 60 mg into the skin every 6 (six) months. [provider] Taking Active Family Member, Pharmacy Records, Self           Med Note Ashok Croon Jul 16, 2022 10:02 PM)    QUEtiapine (SEROQUEL) 25 MG tablet [id number]  Yes TAKE 1 TABLET (25 MG TOTAL) BY MOUTH AT BEDTIME. AGAIN LAST FILL FURTHER REFILLS FROM PSYCHIATRY NO EXCEPTIONS McLean-Scocuzza, Pasty Spillers, MD Taking Active Family Member, Pharmacy Records, Self  QUEtiapine (SEROQUEL) 50 MG tablet [id number] Yes Take 50 mg by mouth at bedtime. [provider] Taking Active Self  sucralfate (CARAFATE) 1 g tablet [id number] Yes TAKE 1 TABLET BY MOUTH TWICE A DAY Wyline Mood, MD Taking Active Self  torsemide (DEMADEX) 20 MG tablet [id number]  Take 2 tablets (40 mg total) by mouth daily. Lolita Patella B, MD  Expired 09/15/22 2359   traMADol (ULTRAM) 50 MG tablet [id number] No Take 1 tablet (50 mg total) by mouth every 12 (twelve) hours as needed. Edward Jolly, MD Unknown Active Self  Vibegron (GEMTESA) 75 MG TABS [id number] Yes Take 1 tablet (75 mg total) by mouth daily. Carman Ching, PA-C Taking Active Self            Home Care and Equipment/Supplies: Were Home Health Services Ordered?: Yes [facility] of [facility] Agency[name] Has Agency set up a time to come to your  home?: Yes First Home Health Visit Date: 02/19/23 Any new equipment or medical supplies ordered?: No  Functional Questionnaire: Do you need assistance with bathing/showering or dressing?: No Do you need assistance with meal preparation?: No Do you need assistance with eating?: No Do you have difficulty maintaining continence: No Do you need assistance with getting out of bed/getting out of a chair/moving?: No Do you have difficulty managing or taking your medications?: No  Follow up appointments reviewed: PCP Follow-up appointment confirmed?: No (Patient notes she is not going to any doctors appointments until she feels better.) MD Provider Line Number:[phone] Given: No Specialist Hospital Follow-up appointment confirmed?: Yes Date of Specialist follow-up appointment?: 02/19/23 Follow-Up Specialty Provider:: Sarina Ser Do you need transportation to your follow-up appointment?: Yes Transportation Need Intervention Addressed By::  (Uses [employer] transport) Do you understand care options if your condition(s) worsen?: Yes-patient verbalized understanding  SDOH Interventions Today    Flowsheet Row Most Recent Value  SDOH Interventions   Food Insecurity Interventions Intervention Not Indicated  Housing Interventions Intervention Not Indicated  Transportation Interventions Intervention Not Indicated  Utilities Interventions Intervention Not Indicated       Goals Addressed             This Visit's Progress    30 day TOC       Current Barriers:  Knowledge Deficits related to plan of care for management of COPD   RNCM Clinical Goal(s):  Patient will work with the Care Management team over the next 30 days to address Transition of Care Barriers: Medication Management Diet/Nutrition/Food Resources Provider appointments Home Health services Equipment/DME through collaboration with RN Care manager, provider, and care team.   Interventions: Evaluation of current treatment  plan related to  self management and patient's adherence to plan as established by provider  Transitions of Care:  New goal. Doctor Visits  - discussed the importance of doctor visits  Patient Goals/Self-Care Activities: Participate in Transition of Care Program/Attend [facility] scheduled calls Notify RN Care Manager of TOC call rescheduling needs Take all medications as prescribed Attend all scheduled provider appointments Call pharmacy for medication refills 3-7 days in advance of running out of medications Call provider office for new concerns or questions   Follow Up Plan:  Telephone follow up appointment with care management team member scheduled for:  02/24/23@2 :30 with [facility], RNCM  Jodelle Gross RN, BSN, CCM RN Care Manager  Transitions of Care  [facility]  [phone]

## 2023-02-19 NOTE — Transitions of Care (Post Inpatient/ED Visit) (Deleted)
02/19/2023  Name: Kerri Carter MRN: [id number] DOB: 17-Nov-1940  Today's TOC FU Call Status: Today's TOC FU Call Status:: Successful TOC FU Call Completed TOC FU Call Complete Date: 02/19/23 Patient's Name and Date of Birth confirmed.  Transition Care Management Follow-up Telephone Call Date of Discharge: 02/16/23 Discharge Facility: [facility]) Type of Discharge: Inpatient Admission Primary Inpatient Discharge Diagnosis:: COPD Exacerbation How have you been since you were released from the hospital?: Better (Patient's COPD is better but she thinks she got food poisoning and has had diarrhia) Any questions or concerns?: No  Items Reviewed: Did you receive and understand the discharge instructions provided?: Yes Medications obtained,verified, and reconciled?: Yes (Medications Reviewed) Any new allergies since your discharge?: No Dietary orders reviewed?: No Do you have support at home?: Yes People in Home: grandchild(ren) Name of Support/Comfort Primary Source: Patient lives with her granson and his wife  Medications Reviewed Today: Medications Reviewed Today     Reviewed by Jodelle Gross, RN (Case Manager) on 02/19/23 at 1544  Med List Status: <None>   Medication Order Taking? Sig Documenting Provider Last Dose Status Informant  albuterol (PROVENTIL) (2.5 MG/3ML) 0.083% nebulizer solution [id number] Yes Take 3 mLs (2.5 mg total) by nebulization every 6 (six) hours as needed for wheezing or shortness of breath. Salena Saner, MD Taking Active Self  albuterol (VENTOLIN HFA) 108 (90 Base) MCG/ACT inhaler [id number] Yes Inhale 2 puffs into the lungs every 6 (six) hours as needed for wheezing or shortness of breath. McLean-Scocuzza, Pasty Spillers, MD Taking Active Family Member, Pharmacy Records, Self           Med Note Ashok Croon Jul 16, 2022 10:00 PM)    amLODipine (NORVASC) 5 MG tablet [id number] Yes Take 1 tablet (5 mg total) by mouth  daily.  Patient taking differently: Take 5 mg by mouth in the morning and at bedtime.   McLean-Scocuzza, Pasty Spillers, MD Taking Active Family Member, Pharmacy Records, Self           Med Note Penne Lash, [address] Jun 04, 2022 11:02 AM)    apixaban (ELIQUIS) 5 MG TABS tablet [id number] Yes Take 5 mg by mouth 2 (two) times daily. [provider] Taking Active Self  busPIRone (BUSPAR) 5 MG tablet [id number] Yes Take 1 tablet (5 mg total) by mouth 2 (two) times daily. Lurene Shadow, MD Taking Active Family Member, Pharmacy Records, Self           Med Note Vertell Novak Jun 04, 2022 11:03 AM)    cetirizine (ZYRTEC) 10 MG tablet [id number] Yes TAKE 1 TABLET BY MOUTH EVERYDAY AT BEDTIME Salena Saner, MD Taking Active Pharmacy Records, Family Member, Self  dicyclomine (BENTYL) 10 MG capsule [id number] Yes TAKE 1 CAPSULE (10 MG TOTAL) BY MOUTH 2 (TWO) TIMES DAILY AS NEEDED FOR SPASMS. Wyline Mood, MD Taking Active Self  ENBREL SURECLICK 50 MG/ML injection [id number] Yes Inject 50 mg into the skin once a week. Takes on Fridays [provider] Taking Active Family Member, Pharmacy Records, Self           Med Note Vertell Novak Jun 04, 2022 11:06 AM)    escitalopram (LEXAPRO) 10 MG tablet [id number] Yes Take 1 tablet (10 mg total) by mouth daily. McLean-Scocuzza, Pasty Spillers, MD Taking Active Family Member, Pharmacy Records, Self           Med Note [place],  [address] Jun 04, 2022 11:06 AM)    famotidine (PEPCID) 20 MG tablet [id number] Yes TAKE 1 TABLET BY MOUTH EVERYDAY AT BEDTIME Wyline Mood, MD Taking Active Self  Fluticasone-Umeclidin-Vilant [facility] ELLIPTA) 100-62.5-25 MCG/ACT AEPB [id number] Yes Inhale 1 puff into the lungs daily. Salena Saner, MD Taking Active Self  gabapentin (NEURONTIN) 300 MG capsule [id number] Yes Take 300 mg in the morning and 600 mg at night. Dana Allan, MD Taking Active Self  lamoTRIgine (LAMICTAL) 100 MG tablet [id number] Yes  TAKE 1 TAB 2 TIMES DAILY. FURTHER REFILLS NEW PSYCH FOR ALL PSYCH MEDS ONLY TEMP SUPPLY FROM PCP McLean-Scocuzza, Pasty Spillers, MD Taking Active Family Member, Pharmacy Records, Self  leflunomide (ARAVA) 20 MG tablet [id number] Yes Take 1 tablet (20 mg total) by mouth daily. McLean-Scocuzza, Pasty Spillers, MD Taking Active Family Member, Pharmacy Records, Self  lidocaine (LIDODERM) 5 % [id number] Yes Place 1 patch onto the skin 2 (two) times daily as needed. Remove & Discard patch within 12 hours or as directed by MD McLean-Scocuzza, Pasty Spillers, MD Taking Active Family Member, Pharmacy Records, Self           Med Note Penne Lash, [address] Jun 04, 2022 11:09 AM)    lovastatin (MEVACOR) 20 MG tablet [id number] Yes Take 1 tablet (20 mg total) by mouth daily at 6 PM. Dana Allan, MD Taking Active Self  methocarbamol (ROBAXIN) 500 MG tablet [id number] Yes Take 500 mg by mouth 3 (three) times daily. [provider] Taking Active Self  mometasone (NASONEX) 50 MCG/ACT nasal spray [id number] Yes USE 1 SPRAY TO EACH NOSTRIL TWICE A DAY Salena Saner, MD Taking Active Self  montelukast (SINGULAIR) 10 MG tablet [id number] Yes TAKE 1 TABLET BY MOUTH EVERY DAY Salena Saner, MD Taking Active Self  multivitamin-lutein [facility]) CAPS capsule [id number] Yes Take 1 capsule by mouth at bedtime. [provider] Taking Active Family Member, Pharmacy Records, Self  nebivolol (BYSTOLIC) 5 MG tablet [id number] Yes Take 1 tablet (5 mg total) by mouth in the morning and at bedtime. (Note dose changed from 1/2 10 mg bid to 5 mg bid) McLean-Scocuzza, Pasty Spillers, MD Taking Active Family Member, Pharmacy Records, Self  OHTUVAYRE 3 MG/2.5ML SUSP [id number] Yes Inhale 1 ampule into the lungs in the morning and at bedtime. [provider] Taking Active Self  ondansetron (ZOFRAN) 4 MG tablet [id number] Yes Take 1 tablet (4 mg total) by mouth every 6 (six) hours as needed for nausea. Pennie Banter, DO Taking  Active Self  OXYGEN [id number] Yes Inhale 3 L into the lungs continuous. [provider] Taking Active Family Member, Pharmacy Records, Self  pantoprazole (PROTONIX) 40 MG tablet [id number] Yes TAKE 1 TABLET BY MOUTH 2 TIMES DAILY 30 MIN BEFORE FOOD (NOTE REDUCTION IN FREQUENCY) Dana Allan, MD Taking Active   predniSONE (DELTASONE) 20 MG tablet [id number] Yes Take 2 tablets (40 mg total) by mouth daily for 3 days. Tresa Moore, MD Taking Active   predniSONE (DELTASONE) 5 MG tablet [id number] Yes TAKE 1 TABLET BY MOUTH EVERY DAY WITH BREAKFAST Salena Saner, MD Taking Active Self  PROLIA 60 MG/ML SOSY injection [id number] Yes Inject 60 mg into the skin every 6 (six) months. [provider] Taking Active Family Member, Pharmacy Records, Self           Med Note Ashok Croon Jul 16, 2022 10:02 PM)    QUEtiapine (SEROQUEL) 25 MG tablet [id number]  Yes TAKE 1 TABLET (25 MG TOTAL) BY MOUTH AT BEDTIME. AGAIN LAST FILL FURTHER REFILLS FROM PSYCHIATRY NO EXCEPTIONS McLean-Scocuzza, Pasty Spillers, MD Taking Active Family Member, Pharmacy Records, Self  QUEtiapine (SEROQUEL) 50 MG tablet [id number] Yes Take 50 mg by mouth at bedtime. [provider] Taking Active Self  sucralfate (CARAFATE) 1 g tablet [id number] Yes TAKE 1 TABLET BY MOUTH TWICE A DAY Wyline Mood, MD Taking Active Self  torsemide (DEMADEX) 20 MG tablet [id number]  Take 2 tablets (40 mg total) by mouth daily. Lolita Patella B, MD  Expired 09/15/22 2359   traMADol (ULTRAM) 50 MG tablet [id number] No Take 1 tablet (50 mg total) by mouth every 12 (twelve) hours as needed. Edward Jolly, MD Unknown Active Self  Vibegron (GEMTESA) 75 MG TABS [id number] Yes Take 1 tablet (75 mg total) by mouth daily. Carman Ching, PA-C Taking Active Self            Home Care and Equipment/Supplies: Were Home Health Services Ordered?: Yes [name] Has Agency set up a time to come to your  home?: Yes First Home Health Visit Date: 02/19/23 Any new equipment or medical supplies ordered?: No  Functional Questionnaire: Do you need assistance with bathing/showering or dressing?: No Do you need assistance with meal preparation?: No Do you need assistance with eating?: No Do you have difficulty maintaining continence: No Do you need assistance with getting out of bed/getting out of a chair/moving?: No Do you have difficulty managing or taking your medications?: No  Follow up appointments reviewed: PCP Follow-up appointment confirmed?: No (Patient notes she is not going to any doctors appointments until she feels better.) MD Provider Line Number:[phone] Given: No Specialist Hospital Follow-up appointment confirmed?: Yes Date of Specialist follow-up appointment?: 02/19/23 Follow-Up Specialty Provider:: Sarina Ser Do you need transportation to your follow-up appointment?: Yes Transportation Need Intervention Addressed By::  (Uses [employer] transport) Do you understand care options if your condition(s) worsen?: Yes-patient verbalized understanding    SIGNATURE***

## 2023-02-19 NOTE — Telephone Encounter (Signed)
Patient seen in office today. New DME order for nebulizer, mask and tubing sent to [facility]. Nothing further needed.

## 2023-02-19 NOTE — Progress Notes (Signed)
Subjective:    Patient ID: Kerri Carter, female    DOB: March 27, 1940, 83 y.o.   MRN: [id number]  Patient Care Team: Dana Allan, MD as PCP - General (Family Medicine) Otho Ket, RN as [facility] Care Management Salena Saner, MD as Consulting Physician (Pulmonary Disease) Povsic, Reather Converse, PA-C (Cardiology)  Chief Complaint  Patient presents with   [facility] F/U    Cough. Shortness of breath and wheezing.     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  This is a scheduled visit.  I last saw her on 15 8 over 2024.  Since her last visit Kerri Carter has had an admission to [facility] due to COPD exacerbation.  Admission was from 10 February 2018 25 through 16 February 2023.  HPI Discussed the use of AI scribe software for clinical note transcription with the patient, who gave verbal consent to proceed.  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 The patient was recently hospitalized due to COPD exacerbation, which Kerri Carter attributes to a lack of functioning masks for her nebulizer. Kerri Carter reports going two days without breathing treatments, leading to hospitalization. Since discharge, Kerri Carter has not had any breathing treatments due to a lack of supplies.  The patient's nebulizer, which is approximately four years old, was originally her late husband's. Kerri Carter receives her oxygen supplies from [facility]. The patient reports feeling weak, to the point of needing assistance with mobility. Kerri Carter also reports recent episodes of diarrhea and nausea, which may have led to dehydration.  The patient has been using a nebulizer with Ohtuvayre, which Kerri Carter believes has been beneficial. However, Kerri Carter  has been using a mask to administer the medication, which may not be as effective as a mouthpiece. The patient is also on Trelegy for her respiratory conditions.  The patient is diligent about her treatments, but has had issues with supply delivery. Kerri Carter expresses concern about when Kerri Carter will receive her new nebulizer and the necessary supplies.   Kerri Carter has not had any fevers, chills or sweats since her admission.  Review of Systems A 10 point review of systems was performed and it is as noted above otherwise negative.   Patient Active Problem List   Diagnosis Date Noted   COPD exacerbation (HCC) 02/11/2023   Depression with anxiety 02/11/2023   Chronic diastolic CHF (congestive heart failure) (HCC) 02/11/2023   CAD (coronary artery disease) 02/11/2023   Atrial fibrillation, chronic (HCC) 02/11/2023   AKI (acute kidney injury) (HCC) 08/10/2022   Intractable nausea and vomiting 08/10/2022   Gastroenteritis 08/10/2022   Pain management contract signed 07/28/2022   Primary osteoarthritis of both shoulders 07/28/2022   Acute CHF (congestive heart failure) (HCC) 07/18/2022   Fluid overload 07/16/2022   Right rib fracture 07/16/2022   Oral candida 05/17/2022   Hyperparathyroidism due to renal insufficiency (HCC) 04/23/2022   Long term (current) use of anticoagulants 04/08/2022   Obesity (BMI 30-39.9) 03/31/2022   NSVT (nonsustained ventricular tachycardia) (HCC) 03/30/2022   Acute urinary retention 03/30/2022   Sepsis due to pneumonia (HCC) 03/28/2022   Demand ischemia (HCC) 03/28/2022   History of major orthopedic surgery 03/19/2022   Iron deficiency anemia due to chronic blood loss 10/09/2021   Chronic heart failure with preserved ejection  fraction (HCC) 09/24/2021   Acute on chronic respiratory failure with hypoxia and hypercapnia (HCC) 09/10/2021   Acute on chronic diastolic CHF (congestive heart failure) (HCC) 08/20/2021   Paroxysmal atrial fibrillation (HCC) 07/29/2021   Chronic  kidney disease, stage 3a (HCC) 07/23/2021   Hypertensive heart and chronic kidney disease with heart failure and stage 1 through stage 4 chronic kidney disease, or unspecified chronic kidney disease (HCC) 06/02/2021   Irritable bowel syndrome without diarrhea 06/02/2021   Vitamin D deficiency, unspecified 06/02/2021   History of lumbar fusion 05/23/2021   Impaired mobility 12/18/2020   Urinary incontinence 12/18/2020   Lumbar radiculopathy 12/11/2020   Bipolar disorder, current episode mixed, unspecified (HCC) 11/04/2020   Major depressive disorder, single episode, in full remission (HCC) 11/04/2020   Oxygen dependent 11/04/2020   Chronic pain of both shoulders 08/29/2019   Hypertriglyceridemia 07/24/2019   Bipolar disorder, in full remission, most recent episode depressed (HCC) 07/13/2019   Essential hypertension 06/16/2019   Chronic pain 06/16/2019   Lymphedema 06/05/2019   Chronic venous insufficiency 05/25/2019   PAD (peripheral artery disease) (HCC) 05/25/2019   Interstitial pulmonary disease (HCC) 07/07/2018   Leukocytosis 10/19/2017   Allergic rhinitis 09/28/2017   Age-related osteoporosis without current pathological fracture 11/05/2016   Peripheral neuropathy 10/28/2016   Personal history of nicotine dependence 09/10/2016   Osteoporosis 08/27/2016   Drug-induced osteoporosis 08/27/2016   Stage 3b chronic kidney disease (HCC) 08/25/2016   Adnexal mass 08/25/2016   Prediabetes 08/25/2016   Coronary atherosclerosis 08/25/2016   Hyperlipidemia 07/17/2015   Macular degeneration 07/17/2015   Rheumatoid arthritis (HCC) 12/05/2014   Osteoarthritis of knee 06/07/2013   Shortness of breath 10/13/2011   Valvular heart disease 10/13/2011   COPD with acute exacerbation (HCC) 09/09/2011   OSA (obstructive sleep apnea) 09/09/2011   Gastroesophageal reflux disease 02/24/2011   Acquired absence of kidney 02/24/2011   Renal artery stenosis (HCC) 02/24/2011    Social History    Tobacco Use   Smoking status: Former    Current packs/day: 0.00    Average packs/day: 0.5 packs/day for 20.0 years (10.0 ttl pk-yrs)    Types: Cigarettes    Start date: 02/02/1954    Quit date: 02/02/1974    Years since quitting: 49.0   Smokeless tobacco: Never  Substance Use Topics   Alcohol use: No    Allergies  Allergen Reactions   Ceftin [Cefuroxime Axetil] Anaphylaxis   Lisinopril Anaphylaxis   Sulfa Antibiotics Other (See Comments)    Face swelling   Sulfasalazine Anaphylaxis   Morphine Other (See Comments)    Per patient, low blood pressure issues that requires action to raise it back up. Can take small infrequent doses   Xarelto [Rivaroxaban] Other (See Comments)    Stomach burning, bleeding, and tar in stool   Adhesive [Tape] Rash    Paper tape and tega derm OK   Antihistamines, Chlorpheniramine-Type Other (See Comments)    Makes pt hyper   Antivert [Meclizine Hcl] Other (See Comments)    Bladder will not empty   Aspirin Other (See Comments)    Sulfasalazine allergy cross reacts   Contrast Media [Iodinated Contrast Media] Rash    Kerri Carter is able to use betadine scrubs.   Decongestant [Pseudoephedrine Hcl] Other (See Comments)    Makes pt hyper   Doxycycline Other (See Comments)    GI upset   Levaquin [Levofloxacin In D5w] Rash   Polymyxin B Other (See Comments)    Facial rash   Tetanus Toxoids Rash and  Other (See Comments)    Fever and hot to touch at injection site    Current Meds  Medication Sig   albuterol (PROVENTIL) (2.5 MG/3ML) 0.083% nebulizer solution Take 3 mLs (2.5 mg total) by nebulization every 6 (six) hours as needed for wheezing or shortness of breath.   albuterol (VENTOLIN HFA) 108 (90 Base) MCG/ACT inhaler Inhale 2 puffs into the lungs every 6 (six) hours as needed for wheezing or shortness of breath.   amLODipine (NORVASC) 5 MG tablet Take 1 tablet (5 mg total) by mouth daily. (Patient taking differently: Take 5 mg by mouth in the morning and at  bedtime.)   apixaban (ELIQUIS) 5 MG TABS tablet Take 5 mg by mouth 2 (two) times daily.   busPIRone (BUSPAR) 5 MG tablet Take 1 tablet (5 mg total) by mouth 2 (two) times daily.   cetirizine (ZYRTEC) 10 MG tablet TAKE 1 TABLET BY MOUTH EVERYDAY AT BEDTIME   dicyclomine (BENTYL) 10 MG capsule TAKE 1 CAPSULE (10 MG TOTAL) BY MOUTH 2 (TWO) TIMES DAILY AS NEEDED FOR SPASMS.   ENBREL SURECLICK 50 MG/ML injection Inject 50 mg into the skin once a week. Takes on Fridays   escitalopram (LEXAPRO) 10 MG tablet Take 1 tablet (10 mg total) by mouth daily.   famotidine (PEPCID) 20 MG tablet TAKE 1 TABLET BY MOUTH EVERYDAY AT BEDTIME   Fluticasone-Umeclidin-Vilant (TRELEGY ELLIPTA) 100-62.5-25 MCG/ACT AEPB Inhale 1 puff into the lungs daily.   gabapentin (NEURONTIN) 300 MG capsule Take 300 mg in the morning and 600 mg at night.   lamoTRIgine (LAMICTAL) 100 MG tablet TAKE 1 TAB 2 TIMES DAILY. FURTHER REFILLS NEW PSYCH FOR ALL PSYCH MEDS ONLY TEMP SUPPLY FROM PCP   leflunomide (ARAVA) 20 MG tablet Take 1 tablet (20 mg total) by mouth daily.   lidocaine (LIDODERM) 5 % Place 1 patch onto the skin 2 (two) times daily as needed. Remove & Discard patch within 12 hours or as directed by MD   lovastatin (MEVACOR) 20 MG tablet Take 1 tablet (20 mg total) by mouth daily at 6 PM.   methocarbamol (ROBAXIN) 500 MG tablet Take 500 mg by mouth 3 (three) times daily.   mometasone (NASONEX) 50 MCG/ACT nasal spray USE 1 SPRAY TO EACH NOSTRIL TWICE A DAY   montelukast (SINGULAIR) 10 MG tablet TAKE 1 TABLET BY MOUTH EVERY DAY   multivitamin-lutein (OCUVITE-LUTEIN) CAPS capsule Take 1 capsule by mouth at bedtime.   nebivolol (BYSTOLIC) 5 MG tablet Take 1 tablet (5 mg total) by mouth in the morning and at bedtime. (Note dose changed from 1/2 10 mg bid to 5 mg bid)   OHTUVAYRE 3 MG/2.5ML SUSP Inhale 1 ampule into the lungs in the morning and at bedtime.   ondansetron (ZOFRAN) 4 MG tablet Take 1 tablet (4 mg total) by mouth every 6  (six) hours as needed for nausea.   OXYGEN Inhale 3 L into the lungs continuous.   pantoprazole (PROTONIX) 40 MG tablet TAKE 1 TABLET BY MOUTH 2 TIMES DAILY 30 MIN BEFORE FOOD (NOTE REDUCTION IN FREQUENCY)   predniSONE (DELTASONE) 20 MG tablet Take 2 tablets (40 mg total) by mouth daily for 3 days.   predniSONE (DELTASONE) 5 MG tablet TAKE 1 TABLET BY MOUTH EVERY DAY WITH BREAKFAST   PROLIA 60 MG/ML SOSY injection Inject 60 mg into the skin every 6 (six) months.   QUEtiapine (SEROQUEL) 25 MG tablet TAKE 1 TABLET (25 MG TOTAL) BY MOUTH AT BEDTIME. AGAIN LAST FILL FURTHER REFILLS  FROM PSYCHIATRY NO EXCEPTIONS   QUEtiapine (SEROQUEL) 50 MG tablet Take 50 mg by mouth at bedtime.   sucralfate (CARAFATE) 1 g tablet TAKE 1 TABLET BY MOUTH TWICE A DAY   traMADol (ULTRAM) 50 MG tablet Take 1 tablet (50 mg total) by mouth every 12 (twelve) hours as needed.   Vibegron (GEMTESA) 75 MG TABS Take 1 tablet (75 mg total) by mouth daily.    Immunization History  Administered Date(s) Administered   Fluad Quad(high Dose 65+) 10/03/2018, 10/16/2019, 11/22/2020, 10/21/2021   Fluad Trivalent(High Dose 65+) 11/17/2022   Influenza Split 10/04/2012, 10/25/2013, 10/28/2016   Influenza Whole 11/03/2011   Influenza, High Dose Seasonal PF 10/04/2015, 11/03/2016   Influenza,inj,Quad PF,6+ Mos 10/31/2014   Influenza-Unspecified 01/02/2010, 11/24/2011, 10/21/2013, 10/31/2014, 09/28/2017   PPD Test 07/17/2013, 11/05/2020, 04/09/2022   Pneumococcal Conjugate-13 10/21/2013   Pneumococcal Polysaccharide-23 11/07/2015   Respiratory Syncytial Virus Vaccine,Recomb Aduvanted(Arexvy) 01/12/2022        Objective:     BP 120/70 (BP Location: Left Arm, Patient Position: Sitting, Cuff Size: Normal)   Pulse 88   Temp 98 F (36.7 C) (Temporal)   Ht 4\' 11"  (1.499 m)   Wt 175 lb (79.4 kg)   SpO2 98% Comment: 3L  BMI 35.35 kg/m   SpO2: 98 % (3L)  GENERAL: Well-developed, well-nourished elderly woman, Kerri Carter presents in a  transport chair. HEAD: Normocephalic, atraumatic. EYES: Pupils equal, round, reactive to light.  No scleral icterus. MOUTH: Oral mucosa moist.  No thrush. NECK: Supple. No thyromegaly. Trachea midline. No JVD.  No adenopathy. PULMONARY: Good air entry bilaterally.  Coarse breath sounds throughout, otherwise no adventitious sounds.  CARDIOVASCULAR: S1 and S2. Regular rate and rhythm.  Grade 3/6 systolic ejection murmur left sternal border, seems more prominent than on prior exam. GASTROINTESTINAL: Benign MUSCULOSKELETAL: Moderate changes of RA both hands, no clubbing, +1 edema LE's.  Limited range of motion of joints due to RA. NEUROLOGIC: No overt focal deficits.  Awake, alert, speech is fluent. SKIN: Intact,warm,dry. PSYCH: Mood and behavior appropriate    Assessment & Plan:     ICD-10-CM   1. COPD with asthma (HCC)  J44.89 AMB REFERRAL FOR DME    2. Chronic respiratory failure with hypoxia (HCC)  J96.11     3. NSIP (nonspecific interstitial pneumonitis) (HCC)  J84.89     4. Rheumatoid arthritis involving multiple sites with positive rheumatoid factor (HCC)  M05.79       Orders Placed This Encounter  Procedures   AMB REFERRAL FOR DME    Referral Priority:   Routine    Referral Type:   Durable Medical Equipment Purchase    Number of Visits Requested:   1    Discussion:    Interstitial Lung Disease (NSIP) associated with Rheumatoid Arthritis Presents for follow-up. No new symptoms or wheezing noted. - Continue current management - Follow-up in 3-4 weeks  Chronic Asthmatic Bronchitis Recent hospitalization due to COPD exacerbation. Issues with nebulizer and supplies. Currently using Trelegy and Ohtuvayre as well as PRN albuterol. Reports weakness, diarrhea, and nausea, likely due to dehydration. Discussed importance of using a mouthpiece for nebulizer treatments to ensure medication efficacy. Recommended hydration with Pedialyte or Gatorade to replenish electrolytes. -  Order new nebulizer through [facility] - Ordered new nebulizer with supplies nebulizer treatments - Follow-up in 3-4 weeks  Chronic Diastolic Dysfunction Well-managed. No new symptoms reported. - Continue current management - Follow-up in 3-4 weeks.    Advised if symptoms do not improve or worsen, to please  contact office for sooner follow up or seek emergency care.    I spent 42 minutes of dedicated to the care of this patient on the date of this encounter to include pre-visit review of records, face-to-face time with the patient discussing conditions above, post visit ordering of testing, clinical documentation with the electronic health record, making appropriate referrals as documented, and communicating necessary findings to members of the patients care team.     C. Danice Goltz, MD Advanced Bronchoscopy PCCM [facility]    *This note was generated using voice recognition software/Dragon and/or AI transcription program.  Despite best efforts to proofread, errors can occur which can change the meaning. Any transcriptional errors that result from this process are unintentional and may not be fully corrected at the time of dictation.

## 2023-02-22 ENCOUNTER — Telehealth: Payer: Self-pay

## 2023-02-22 ENCOUNTER — Ambulatory Visit: Payer: Medicare PPO | Admitting: Physician Assistant

## 2023-02-22 ENCOUNTER — Other Ambulatory Visit: Payer: Self-pay

## 2023-02-22 ENCOUNTER — Emergency Department
Admission: EM | Admit: 2023-02-22 | Discharge: 2023-02-22 | Disposition: A | Discharge status: Home / Self Care | Payer: Medicare PPO | Attending: Emergency Medicine | Admitting: Emergency Medicine

## 2023-02-22 ENCOUNTER — Emergency Department: Payer: Medicare PPO

## 2023-02-22 DIAGNOSIS — R609 Edema, unspecified: Secondary | ICD-10-CM | POA: Diagnosis not present

## 2023-02-22 DIAGNOSIS — W010XXA Fall on same level from slipping, tripping and stumbling without subsequent striking against object, initial encounter: Secondary | ICD-10-CM | POA: Insufficient documentation

## 2023-02-22 DIAGNOSIS — W19XXXA Unspecified fall, initial encounter: Secondary | ICD-10-CM | POA: Diagnosis not present

## 2023-02-22 DIAGNOSIS — S0003XA Contusion of scalp, initial encounter: Secondary | ICD-10-CM | POA: Diagnosis not present

## 2023-02-22 DIAGNOSIS — G9389 Other specified disorders of brain: Secondary | ICD-10-CM | POA: Diagnosis not present

## 2023-02-22 DIAGNOSIS — Z7901 Long term (current) use of anticoagulants: Secondary | ICD-10-CM | POA: Diagnosis not present

## 2023-02-22 DIAGNOSIS — S0990XA Unspecified injury of head, initial encounter: Secondary | ICD-10-CM | POA: Diagnosis not present

## 2023-02-22 NOTE — Telephone Encounter (Signed)
Copied from CRM [id number]. Topic: Clinical - Home Health Verbal Orders >> Feb 22, 2023  9:51 AM Myrtice Lauth wrote: Caller/Agency: Rubie Maid moon Callback Number: [phone] Service Requested: Physical Therapy Frequency: 2 times a week for 2 week, 1 time a week for 5 weeks 1 level 2 medication interaction warning, Escitalocram 10mg , quetiapime 75 mg. Any new concerns about the patient? Yes

## 2023-02-22 NOTE — ED Triage Notes (Signed)
Pt comes via EMS from home with c/o fall. Pt state she tripped over O2 tubing. Pt states she fell face down. Pt has goose egg to left side head and nose. Pt is on eliquis. Pt denies any loc. Pt wear 3L [place] chronically.

## 2023-02-22 NOTE — Discharge Instructions (Signed)
No sign of bleeding in the brain, injury to the facial bones, or injury to your cervical spine bones.  The bruising will be present for several days.  Please follow-up with your primary care doctor as needed.

## 2023-02-22 NOTE — ED Provider Notes (Signed)
[facility] Provider Note    Event Date/Time   First MD Initiated Contact with Patient 02/22/23 2034     (approximate)   History   Fall   HPI Kerri Carter is a 83 y.o. female presenting today for a fall.  Patient states she was up walking around and tripped over her oxygen tubing.  States she fell and hit the front of her face.  Denies loss of consciousness.  Has slight pain to the front of her face but no pain elsewhere.  Denies pain throughout bilateral upper and lower extremities.  No other signs of trauma.  She is on Eliquis.  Denies lightheadedness or chest pain.     Physical Exam   Triage Vital Signs: ED Triage Vitals  Encounter Vitals Group     BP 02/22/23 1618 123/62     Systolic BP Percentile --      Diastolic BP Percentile --      Pulse Rate 02/22/23 1618 73     Resp 02/22/23 1618 19     Temp 02/22/23 1618 98.2 F (36.8 C)     Temp src --      SpO2 02/22/23 1618 100 %     Weight 02/22/23 1610 175 lb (79.4 kg)     Height 02/22/23 1610 4\' 11"  (1.499 m)     Head Circumference --      Peak Flow --      Pain Score 02/22/23 1610 4     Pain Loc --      Pain Education --      Exclude from Growth Chart --     Most recent vital signs: Vitals:   02/22/23 1618  BP: 123/62  Pulse: 73  Resp: 19  Temp: 98.2 F (36.8 C)  SpO2: 100%   I have reviewed the vital signs. General:  Awake, alert, no acute distress. Head:  Normocephalic, hematoma to frontal scalp with slight abrasion as well as abrasion over the bridge of the nose. EENT:  PERRL, EOMI, Oral mucosa pink and moist, Neck is supple. Cardiovascular: Regular rate, 2+ distal pulses. Respiratory:  Normal respiratory effort, symmetrical expansion, no distress.   Extremities:  Moving all four extremities through full ROM without pain.  Nontender to palpation throughout bilateral upper and lower extremities.  Nontender to palpation to C, T, or L-spine Neuro:  Alert and oriented.   Interacting appropriately.   Skin:  Warm, dry, no rash.   Psych: Appropriate affect.    ED Results / Procedures / Treatments   Labs (all labs ordered are listed, but only abnormal results are displayed) Labs Reviewed - No data to display   EKG    RADIOLOGY Independently interpreted CT imaging with no acute traumatic pathology   PROCEDURES:  Critical Care performed: No  Procedures   MEDICATIONS ORDERED IN ED: Medications - No data to display   IMPRESSION / MDM / ASSESSMENT AND PLAN / ED COURSE  I reviewed the triage vital signs and the nursing notes.                              Differential diagnosis includes, but is not limited to, ICH, facial fracture, nasal bone fracture, cervical spine injury  Patient's presentation is most consistent with acute complicated illness / injury requiring diagnostic workup.  Patient is an 83 year old female presenting today for ground-level fall with frontal face injury on Eliquis.  Vital signs are  stable and physical exam most notable for hematoma to frontal scalp.  Otherwise neuro intact with no other acute traumatic injuries elsewhere on exam.  CT imaging of head, maxillofacial, and cervical spine showed no acute severe traumatic injuries.  Patient reassessed and still at her baseline.  Safer discharge back home.  Told to follow-up with PCP as needed.     FINAL CLINICAL IMPRESSION(S) / ED DIAGNOSES   Final diagnoses:  Fall, initial encounter  Hematoma of frontal scalp, initial encounter     Rx / DC Orders   ED Discharge Orders     None        Note:  This document was prepared using Dragon voice recognition software and may include unintentional dictation errors.   Janith Lima, MD 02/22/23 2115

## 2023-02-23 DIAGNOSIS — J441 Chronic obstructive pulmonary disease with (acute) exacerbation: Secondary | ICD-10-CM | POA: Diagnosis not present

## 2023-02-23 NOTE — Telephone Encounter (Signed)
Called Amy Moon from [place] her vm has not been set up to leave a message.

## 2023-02-23 NOTE — Telephone Encounter (Signed)
Concern was the level 2 medication interaction warning, Escitalocram 10mg , quetiapime 75 mg.  Hospital ordered PT and OT for pt. Amy stated that she  needs Pt because she was in the hospital for 4 days.

## 2023-02-24 ENCOUNTER — Other Ambulatory Visit: Payer: Self-pay

## 2023-02-24 ENCOUNTER — Emergency Department
Admission: EM | Admit: 2023-02-24 | Discharge: 2023-02-24 | Disposition: A | Discharge status: Home / Self Care | Payer: Medicare PPO | Attending: Emergency Medicine | Admitting: Emergency Medicine

## 2023-02-24 ENCOUNTER — Encounter: Payer: Self-pay | Admitting: Emergency Medicine

## 2023-02-24 ENCOUNTER — Emergency Department: Payer: Medicare PPO

## 2023-02-24 DIAGNOSIS — W182XXA Fall in (into) shower or empty bathtub, initial encounter: Secondary | ICD-10-CM | POA: Diagnosis not present

## 2023-02-24 DIAGNOSIS — Z043 Encounter for examination and observation following other accident: Secondary | ICD-10-CM | POA: Diagnosis not present

## 2023-02-24 DIAGNOSIS — Z7901 Long term (current) use of anticoagulants: Secondary | ICD-10-CM | POA: Insufficient documentation

## 2023-02-24 DIAGNOSIS — W19XXXA Unspecified fall, initial encounter: Secondary | ICD-10-CM

## 2023-02-24 DIAGNOSIS — S81812A Laceration without foreign body, left lower leg, initial encounter: Secondary | ICD-10-CM | POA: Diagnosis not present

## 2023-02-24 DIAGNOSIS — I959 Hypotension, unspecified: Secondary | ICD-10-CM | POA: Diagnosis not present

## 2023-02-24 DIAGNOSIS — Z96643 Presence of artificial hip joint, bilateral: Secondary | ICD-10-CM | POA: Diagnosis not present

## 2023-02-24 DIAGNOSIS — R58 Hemorrhage, not elsewhere classified: Secondary | ICD-10-CM | POA: Diagnosis not present

## 2023-02-24 DIAGNOSIS — M25552 Pain in left hip: Secondary | ICD-10-CM | POA: Insufficient documentation

## 2023-02-24 DIAGNOSIS — S8992XA Unspecified injury of left lower leg, initial encounter: Secondary | ICD-10-CM | POA: Diagnosis present

## 2023-02-24 MED ORDER — CLINDAMYCIN HCL 150 MG PO CAPS
300.0000 mg | ORAL_CAPSULE | Freq: Once | ORAL | Status: AC
  Administered 2023-02-24: 300 mg via ORAL
  Filled 2023-02-24: qty 2

## 2023-02-24 MED ORDER — CLINDAMYCIN HCL 300 MG PO CAPS: 300.0000 mg | ORAL_CAPSULE | Freq: Three times a day (TID) | ORAL | 0 refills | Status: AC

## 2023-02-24 MED ORDER — LIDOCAINE-EPINEPHRINE 2 %-1:100000 IJ SOLN
20.0000 mL | Freq: Once | INTRAMUSCULAR | Status: AC
  Administered 2023-02-24: 20 mL via INTRADERMAL
  Filled 2023-02-24: qty 1

## 2023-02-24 MED ORDER — OXYCODONE-ACETAMINOPHEN 5-325 MG PO TABS
1.0000 | ORAL_TABLET | Freq: Once | ORAL | Status: AC
  Administered 2023-02-24: 1 via ORAL
  Filled 2023-02-24: qty 1

## 2023-02-24 NOTE — ED Provider Notes (Signed)
[facility] Provider Note    Event Date/Time   First MD Initiated Contact with Patient 02/24/23 1510     (approximate)   History   Fall and Laceration   HPI Kerri Carter is a 83 y.o. female on Eliquis presenting today for fall.  Patient states she was getting out of her shower when she tripped and caught her left leg.  Noted laceration to her left lower leg.  Has mild pain in her left hip and at the site of the laceration but no injury elsewhere.  Denies any injury to her head or neck.  No loss of consciousness.  Patient was seen here yesterday for fall with injury to her head with negative CT imaging. Patient states she is allergic to tetanus shots and cannot receive them.     Physical Exam   Triage Vital Signs: ED Triage Vitals  Encounter Vitals Group     BP 02/24/23 1445 (!) 142/58     Systolic BP Percentile --      Diastolic BP Percentile --      Pulse Rate 02/24/23 1445 69     Resp 02/24/23 1445 18     Temp 02/24/23 1445 97.8 F (36.6 C)     Temp Source 02/24/23 1445 Oral     SpO2 02/24/23 1445 96 %     Weight 02/24/23 1436 174 lb 2.6 oz (79 kg)     Height 02/24/23 1436 4\' 11"  (1.499 m)     Head Circumference --      Peak Flow --      Pain Score 02/24/23 1445 7     Pain Loc --      Pain Education --      Exclude from Growth Chart --     Most recent vital signs: Vitals:   02/24/23 1445  BP: (!) 142/58  Pulse: 69  Resp: 18  Temp: 97.8 F (36.6 C)  SpO2: 96%   I have reviewed the vital signs. General:  Awake, alert, no acute distress. Head:  Normocephalic, Atraumatic. EENT:  PERRL, EOMI, Oral mucosa pink and moist, Neck is supple. Cardiovascular: Regular rate, 2+ distal pulses. Respiratory:  Normal respiratory effort, symmetrical expansion, no distress.   Extremities:  Moving all four extremities through full ROM without pain.   Neuro:  Alert and oriented.  Interacting appropriately.   Skin: Large laceration to distal left lower  leg with skin flap exposed.  See media tab.  Fascia above the muscles present but no obvious injury to the bone or exposed bone. Psych: Appropriate affect.    ED Results / Procedures / Treatments   Labs (all labs ordered are listed, but only abnormal results are displayed) Labs Reviewed - No data to display   EKG    RADIOLOGY Independently interpreted x-ray with no acute pathology   PROCEDURES:  Critical Care performed: No  .Laceration Repair  Date/Time: 02/24/2023 5:01 PM  Performed by: Janith Lima, MD Authorized by: Janith Lima, MD   Consent:    Consent obtained:  Verbal   Consent given by:  Patient   Risks, benefits, and alternatives were discussed: yes     Risks discussed:  Infection, pain, poor cosmetic result, poor wound healing and need for additional repair   Alternatives discussed:  No treatment Universal protocol:    Patient identity confirmed:  Verbally with patient Anesthesia:    Anesthesia method:  Local infiltration   Local anesthetic:  Lidocaine 1% WITH epi  Laceration details:    Location:  Leg   Leg location:  L lower leg   Length (cm):  14   Depth (mm):  8 Pre-procedure details:    Preparation:  Patient was prepped and draped in usual sterile fashion and imaging obtained to evaluate for foreign bodies Exploration:    Limited defect created (wound extended): no     Hemostasis achieved with:  Epinephrine and direct pressure   Imaging obtained: x-ray     Imaging outcome: foreign body not noted     Wound exploration: wound explored through full range of motion     Wound extent: areolar tissue violated     Wound extent: fascia not violated, no signs of injury and no vascular damage     Contaminated: yes   Treatment:    Area cleansed with:  Povidone-iodine   Amount of cleaning:  Extensive   Irrigation solution:  Sterile saline   Irrigation volume:  1500cc   Irrigation method:  Pressure wash   Visualized foreign bodies/material removed: no      Debridement:  Moderate   Undermining:  Minimal   Scar revision: no   Skin repair:    Repair method:  Sutures   Suture size:  4-0   Suture material:  Prolene   Suture technique:  Simple interrupted   Number of sutures:  15 Approximation:    Approximation:  Close Repair type:    Repair type:  Complex Post-procedure details:    Dressing:  Non-adherent dressing, tube gauze and bulky dressing   Procedure completion:  Tolerated well, no immediate complications    MEDICATIONS ORDERED IN ED: Medications  clindamycin (CLEOCIN) capsule 300 mg (has no administration in time range)  lidocaine-EPINEPHrine (XYLOCAINE W/EPI) 2 %-1:100000 (with pres) injection 20 mL (20 mLs Intradermal Given by Other 02/24/23 1557)  oxyCODONE-acetaminophen (PERCOCET/ROXICET) 5-325 MG per tablet 1 tablet (1 tablet Oral Given 02/24/23 1542)     IMPRESSION / MDM / ASSESSMENT AND PLAN / ED COURSE  I reviewed the triage vital signs and the nursing notes.                              Differential diagnosis includes, but is not limited to, leg laceration, tibia/fibula fracture, pelvic/proximal humerus fracture  Patient's presentation is most consistent with acute complicated illness / injury requiring diagnostic workup.  Patient is an 83 year old female presenting today for ground-level fall with injury to her left lower leg.  Vital signs are stable and no head injury.  No loss of consciousness.  Does have significant laceration to her left lower leg.  X-rays showed no evidence of fracture to areas of soreness.  Patient underwent significant debridement of the left lower extremity with closure with 15 nonabsorbable sutures.  Wrapped in the emergency department.  Told to follow-up with PCP in 2 days for wound recheck and then outpatient follow-up with orthopedics for complete wound closure to ensure closure and make sure he does not need a wound VAC.  Will discharge on antibiotics given size of laceration.  Given  strict return precautions.     FINAL CLINICAL IMPRESSION(S) / ED DIAGNOSES   Final diagnoses:  Laceration of left lower extremity, initial encounter  Fall, initial encounter     Rx / DC Orders   ED Discharge Orders          Ordered    AMB referral to orthopedics       Comments:  Follow-up for enlarged left leg laceration to ensure proper healing and that it does not need a wound VAC   02/24/23 1659    clindamycin (CLEOCIN) 300 MG capsule  3 times daily        02/24/23 1659             Note:  This document was prepared using Dragon voice recognition software and may include unintentional dictation errors.   Janith Lima, MD 02/24/23 [phone]

## 2023-02-24 NOTE — ED Triage Notes (Signed)
Pt to ED via [facility] from home for fall and laceration to his left lower leg with bone exposed. Pt has bruising on her face from fall yesterday. Pt was seen yesterday for that. PT is in NAD.

## 2023-02-24 NOTE — Discharge Instructions (Addendum)
Please try to follow-up with your primary care provider in 2 days for reassessment of the wound have the bandages changed and rewrapped.  You will also follow-up with orthopedics to ensure complete wound closure and that he does not need additional repair or a wound VAC.  Please pick up the antibiotics from the pharmacy and take as prescribed.  Please return for any severe worsening symptoms such as the laceration opening back up, severe bleeding, or signs of infection.  You have 15 stitches in the leg that will need to come out in approximately 10 to 14 days.

## 2023-02-24 NOTE — ED Provider Triage Note (Signed)
Emergency Medicine Provider Triage Evaluation Note  Kerri Carter , a 83 y.o. female  was evaluated in triage.  Pt complains of fall in shower and laceration. States that she slipped when getting in the shower.  Reached for railing.  Denies LOC or head injury.  States that she was here yesterday for a different fall.    Review of Systems  Positive: Laceration left leg Negative: No LOC or head injury  Physical Exam  There were no vitals taken for this visit. Gen:   Awake, no distress   Alert and talkative Resp:  Normal effort  MSK:   Left lower extremity anterior aspect with laceration.   Other:    Medical Decision Making  Medically screening exam initiated at 2:30 PM.  Appropriate orders placed.  KANYAH DORY was informed that the remainder of the evaluation will be completed by another provider, this initial triage assessment does not replace that evaluation, and the importance of remaining in the ED until their evaluation is complete.     Tommi Rumps, PA-C 02/24/23 1442

## 2023-02-24 NOTE — Patient Outreach (Signed)
  Care Management  Transitions of Care Program Transitions of Care Post-discharge week 2  02/24/2023 Name: VENISA ESTUPINAN MRN: [id number] DOB: 10-23-1940  Subjective: Kerri Carter is a 83 y.o. year old female who is a primary care patient of Dana Allan, MD. The Care Management team was unable to reach the patient by phone to assess and address transitions of care needs.   Plan: Additional outreach attempts will be made to reach the patient enrolled in the [facility] Program (Post Inpatient/ED Visit).  Deidre Ala, RN Medical illustrator [facility] [phone]

## 2023-02-26 ENCOUNTER — Other Ambulatory Visit: Payer: Medicare PPO

## 2023-02-26 ENCOUNTER — Telehealth: Payer: Self-pay

## 2023-02-26 ENCOUNTER — Other Ambulatory Visit: Payer: Self-pay

## 2023-02-26 DIAGNOSIS — I130 Hypertensive heart and chronic kidney disease with heart failure and stage 1 through stage 4 chronic kidney disease, or unspecified chronic kidney disease: Secondary | ICD-10-CM | POA: Diagnosis not present

## 2023-02-26 DIAGNOSIS — G4733 Obstructive sleep apnea (adult) (pediatric): Secondary | ICD-10-CM | POA: Diagnosis not present

## 2023-02-26 DIAGNOSIS — J849 Interstitial pulmonary disease, unspecified: Secondary | ICD-10-CM | POA: Diagnosis not present

## 2023-02-26 DIAGNOSIS — N1832 Chronic kidney disease, stage 3b: Secondary | ICD-10-CM | POA: Diagnosis not present

## 2023-02-26 DIAGNOSIS — J45901 Unspecified asthma with (acute) exacerbation: Secondary | ICD-10-CM | POA: Diagnosis not present

## 2023-02-26 DIAGNOSIS — I5042 Chronic combined systolic (congestive) and diastolic (congestive) heart failure: Secondary | ICD-10-CM | POA: Diagnosis not present

## 2023-02-26 DIAGNOSIS — J9621 Acute and chronic respiratory failure with hypoxia: Secondary | ICD-10-CM | POA: Diagnosis not present

## 2023-02-26 DIAGNOSIS — D631 Anemia in chronic kidney disease: Secondary | ICD-10-CM | POA: Diagnosis not present

## 2023-02-26 DIAGNOSIS — J441 Chronic obstructive pulmonary disease with (acute) exacerbation: Secondary | ICD-10-CM | POA: Diagnosis not present

## 2023-02-26 NOTE — Telephone Encounter (Signed)
Copied from CRM [id number]. Topic: Clinical - Home Health Verbal Orders >> Feb 26, 2023 10:31 AM Fredrich Romans wrote: Caller/Agency: Waynetta Pean Number: [phone]  Any new concerns about the patient? Yes Patient had 2 falls on Monday and Wednesday that she had to go to ER for.Patient cut her leg open on the shower.Patient was placed on antibiotic for 3xs a day for 7 days.

## 2023-02-26 NOTE — Patient Outreach (Signed)
  Care Management  Transitions of Care Program Transitions of Care Post-discharge week 2  02/26/2023 Name: Kerri Carter MRN: [id number] DOB: 1940-03-26  Subjective: Kerri Carter is a 83 y.o. year old female who is a primary care patient of Dana Allan, MD. The Care Management team was unable to reach the patient by phone to assess and address transitions of care needs.   Plan: Additional outreach attempts will be made to reach the patient enrolled in the [facility] Program (Post Inpatient/ED Visit).  Deidre Ala, BSN, RN   [facility] - [employer] [facility] RN Care Manager [phone]

## 2023-03-01 ENCOUNTER — Ambulatory Visit: Payer: Medicare PPO | Admitting: Physician Assistant

## 2023-03-01 DIAGNOSIS — D631 Anemia in chronic kidney disease: Secondary | ICD-10-CM | POA: Diagnosis not present

## 2023-03-01 DIAGNOSIS — J441 Chronic obstructive pulmonary disease with (acute) exacerbation: Secondary | ICD-10-CM | POA: Diagnosis not present

## 2023-03-01 DIAGNOSIS — J849 Interstitial pulmonary disease, unspecified: Secondary | ICD-10-CM | POA: Diagnosis not present

## 2023-03-01 DIAGNOSIS — J4489 Other specified chronic obstructive pulmonary disease: Secondary | ICD-10-CM | POA: Diagnosis not present

## 2023-03-01 DIAGNOSIS — N1832 Chronic kidney disease, stage 3b: Secondary | ICD-10-CM | POA: Diagnosis not present

## 2023-03-01 DIAGNOSIS — J45901 Unspecified asthma with (acute) exacerbation: Secondary | ICD-10-CM | POA: Diagnosis not present

## 2023-03-01 DIAGNOSIS — I5042 Chronic combined systolic (congestive) and diastolic (congestive) heart failure: Secondary | ICD-10-CM | POA: Diagnosis not present

## 2023-03-01 DIAGNOSIS — G4733 Obstructive sleep apnea (adult) (pediatric): Secondary | ICD-10-CM | POA: Diagnosis not present

## 2023-03-01 DIAGNOSIS — I130 Hypertensive heart and chronic kidney disease with heart failure and stage 1 through stage 4 chronic kidney disease, or unspecified chronic kidney disease: Secondary | ICD-10-CM | POA: Diagnosis not present

## 2023-03-01 DIAGNOSIS — J9621 Acute and chronic respiratory failure with hypoxia: Secondary | ICD-10-CM | POA: Diagnosis not present

## 2023-03-02 DIAGNOSIS — J45901 Unspecified asthma with (acute) exacerbation: Secondary | ICD-10-CM | POA: Diagnosis not present

## 2023-03-02 DIAGNOSIS — J441 Chronic obstructive pulmonary disease with (acute) exacerbation: Secondary | ICD-10-CM | POA: Diagnosis not present

## 2023-03-02 DIAGNOSIS — I130 Hypertensive heart and chronic kidney disease with heart failure and stage 1 through stage 4 chronic kidney disease, or unspecified chronic kidney disease: Secondary | ICD-10-CM | POA: Diagnosis not present

## 2023-03-02 DIAGNOSIS — J849 Interstitial pulmonary disease, unspecified: Secondary | ICD-10-CM | POA: Diagnosis not present

## 2023-03-02 DIAGNOSIS — I5042 Chronic combined systolic (congestive) and diastolic (congestive) heart failure: Secondary | ICD-10-CM | POA: Diagnosis not present

## 2023-03-02 DIAGNOSIS — D631 Anemia in chronic kidney disease: Secondary | ICD-10-CM | POA: Diagnosis not present

## 2023-03-02 DIAGNOSIS — J9621 Acute and chronic respiratory failure with hypoxia: Secondary | ICD-10-CM | POA: Diagnosis not present

## 2023-03-02 DIAGNOSIS — N1832 Chronic kidney disease, stage 3b: Secondary | ICD-10-CM | POA: Diagnosis not present

## 2023-03-02 DIAGNOSIS — G4733 Obstructive sleep apnea (adult) (pediatric): Secondary | ICD-10-CM | POA: Diagnosis not present

## 2023-03-03 ENCOUNTER — Encounter: Payer: Self-pay | Admitting: Family Medicine

## 2023-03-03 ENCOUNTER — Telehealth: Payer: Self-pay

## 2023-03-03 NOTE — Telephone Encounter (Signed)
I left a voicemail for patient asking her to please call us.  When patient calls back, please reschedule her 03/05/2023 visit with Dr. Dana Allan, as provider will not be in the office.  I did sent a letter to patient via MyChart.

## 2023-03-04 DIAGNOSIS — I5042 Chronic combined systolic (congestive) and diastolic (congestive) heart failure: Secondary | ICD-10-CM | POA: Diagnosis not present

## 2023-03-04 DIAGNOSIS — G4733 Obstructive sleep apnea (adult) (pediatric): Secondary | ICD-10-CM | POA: Diagnosis not present

## 2023-03-04 DIAGNOSIS — J9621 Acute and chronic respiratory failure with hypoxia: Secondary | ICD-10-CM | POA: Diagnosis not present

## 2023-03-04 DIAGNOSIS — I130 Hypertensive heart and chronic kidney disease with heart failure and stage 1 through stage 4 chronic kidney disease, or unspecified chronic kidney disease: Secondary | ICD-10-CM | POA: Diagnosis not present

## 2023-03-04 DIAGNOSIS — J849 Interstitial pulmonary disease, unspecified: Secondary | ICD-10-CM | POA: Diagnosis not present

## 2023-03-04 DIAGNOSIS — J45901 Unspecified asthma with (acute) exacerbation: Secondary | ICD-10-CM | POA: Diagnosis not present

## 2023-03-04 DIAGNOSIS — N1832 Chronic kidney disease, stage 3b: Secondary | ICD-10-CM | POA: Diagnosis not present

## 2023-03-04 DIAGNOSIS — D631 Anemia in chronic kidney disease: Secondary | ICD-10-CM | POA: Diagnosis not present

## 2023-03-04 DIAGNOSIS — J441 Chronic obstructive pulmonary disease with (acute) exacerbation: Secondary | ICD-10-CM | POA: Diagnosis not present

## 2023-03-05 ENCOUNTER — Encounter: Payer: Self-pay | Admitting: Nurse Practitioner

## 2023-03-05 ENCOUNTER — Inpatient Hospital Stay: Payer: Medicare PPO | Admitting: Family Medicine

## 2023-03-05 ENCOUNTER — Ambulatory Visit (INDEPENDENT_AMBULATORY_CARE_PROVIDER_SITE_OTHER): Payer: Medicare PPO

## 2023-03-05 ENCOUNTER — Ambulatory Visit: Payer: Medicare PPO | Admitting: Nurse Practitioner

## 2023-03-05 VITALS — BP 122/66 | HR 100 | Temp 98.0°F | Ht 59.0 in | Wt 181.8 lb

## 2023-03-05 DIAGNOSIS — M7989 Other specified soft tissue disorders: Secondary | ICD-10-CM | POA: Diagnosis not present

## 2023-03-05 DIAGNOSIS — S81812D Laceration without foreign body, left lower leg, subsequent encounter: Secondary | ICD-10-CM

## 2023-03-05 DIAGNOSIS — M19042 Primary osteoarthritis, left hand: Secondary | ICD-10-CM | POA: Diagnosis not present

## 2023-03-05 DIAGNOSIS — S62525A Nondisplaced fracture of distal phalanx of left thumb, initial encounter for closed fracture: Secondary | ICD-10-CM | POA: Diagnosis not present

## 2023-03-05 DIAGNOSIS — S52602K Unspecified fracture of lower end of left ulna, subsequent encounter for closed fracture with nonunion: Secondary | ICD-10-CM | POA: Diagnosis not present

## 2023-03-05 DIAGNOSIS — M1812 Unilateral primary osteoarthritis of first carpometacarpal joint, left hand: Secondary | ICD-10-CM | POA: Diagnosis not present

## 2023-03-05 NOTE — Patient Instructions (Addendum)
Please follow up with ortho Orthopedic Surgery at [facility]  [address] [place] [address] [phone]  Keep the foot elevated and complete the dose of antibiotic.

## 2023-03-05 NOTE — Progress Notes (Unsigned)
Established Patient Office Visit  Subjective:  Patient ID: Kerri Carter, female    DOB: 03/19/1940  Age: 83 y.o. MRN: [id number]  CC:  Chief Complaint  Patient presents with   Hospitalization Follow-up   Discussed the use of a AI scribe software for clinical note transcription with the patient, who gave verbal consent to proceed.  HPI  Kerri Carter presents for follow up on leg laceration.  She sustained a leg laceration approximately nine days ago when her left leg hit a bar while entering the shower, resulting in a deep cut that required fifteen stitches. The emergency room provided thorough wound cleaning before suturing. She has been on antibiotics three times a day. She has swelling on the leg and foot.   She also reports left thumb swelling and pain with bending since the fall.   HPI   Past Medical History:  Diagnosis Date   Acromial process of scapula fracture 12/21/2019   Acute kidney injury superimposed on chronic kidney disease (HCC) 05/26/2021   Acute on chronic combined systolic (congestive) and diastolic (congestive) heart failure (HCC) 07/28/2021   Acute on chronic combined systolic and diastolic CHF (congestive heart failure) (HCC) 05/26/2021   Acute on chronic respiratory failure with hypoxia (HCC) 09/10/2021   Acute pain of left knee 12/18/2020   Anemia in stage 3a chronic kidney disease (HCC) 10/09/2021   Anxiety    Anxiety 01/20/2019   Anxiety and depression 05/26/2021   Aortic stenosis 07/09/2015   a.) TTE 07/06/2015: EF 55-60%; mild AS with MPG 13.0 mmHg.   Arrhythmia    atrial fibrillation   Assistance needed for mobility 12/18/2020   Asthma    At high risk for falls 03/16/2019   Avascular necrosis of left shoulder due to adverse effect of steroid therapy (HCC) 01/20/2018   Bipolar disorder (HCC)    C. difficile diarrhea 2010   a.) following ABX course during hospital admission   Carotid atherosclerosis, bilateral    a.) Moderate; < 50% stenosis  BILATERAL ICAs.   Cataract    a.) s/p BILATERAL extraction   CHF (congestive heart failure) (HCC)    Chicken pox    Chronic left shoulder pain 08/29/2019   Chronic respiratory failure with hypoxia (HCC) 10/01/2021   CKD (chronic kidney disease), stage III (HCC)    a. s/p R nephrectomy./ aneurysm   Closed fracture of acromial process of scapula 10/28/2018   Closed nondisplaced fracture of proximal phalanx of lesser toe of right foot    Collapsed vertebra, not elsewhere classified, lumbar region, initial encounter for fracture (HCC) 02/21/2019   Community acquired pneumonia 07/22/2021   Complicated grief 03/16/2019   Compression fracture of L4 vertebra (HCC) 02/21/2019   Conversion disorder    Conversion disorder 08/04/2015   COPD (chronic obstructive pulmonary disease) (HCC)    COPD with exacerbation (HCC) 08/20/2021   Cough 05/02/2012   Depression    Dislocation of hip joint prosthesis (HCC) 01/08/2017   Elevated brain natriuretic peptide (BNP) level 12/18/2020   Elevated d-dimer 05/26/2021   Episodic mood disorder (HCC) 04/18/2019   Essential hypertension    Fatigue 07/07/2018   Frequent falls 12/18/2020   GERD (gastroesophageal reflux disease)    H/O cardiac catheterization 02/24/2011   Formatting of this note might be different from the original.  Repeated at [facility] 2/13 and insignificant disease   Hav (hallux abducto valgus), unspecified laterality 12/05/2018   Heart murmur    Hip fracture (HCC) 10/31/2016   Hip pain  12/18/2020   History of fracture of left hip 01/15/2017   History of syncope 08/25/2016   Hyperkalemia 05/26/2021   - The patient was given p.o. Lokelma and IV Lasix.  - We will follow potassium level.   Hyperlipidemia    Hypokalemia 08/25/2016   ILD (interstitial lung disease) (HCC)    mild; 2/2 RA diagnosis   Iliopsoas bursitis of right hip 08/29/2019   Inflammatory arthritis    a. hands/carpal tunnel.  b. Low titer rheumatoid factor. c. Negative  anti-CCP antibodies. d. Plaquenil.   Insomnia 07/17/2015   Intractable nausea and vomiting 08/10/2022   Left foot pain 10/19/2016   Left lower lobe pneumonia 05/26/2021   Leg edema 05/02/2019   Leg pain, bilateral    Leg swelling 12/15/2016   Leukocytosis 10/19/2017   Leukopenia 06/24/2021   Lymphocytosis 07/13/2021   Macular degeneration    Microcytic anemia 08/15/2015   Mixed bipolar I disorder (HCC) 10/09/2015   Nocturnal hypoxemia    Nocturnal hypoxemia 09/09/2011   June 2013 overnight oximetry on 6 centimeters of water CPAP: SpO2 less than 88% 72% of the time   Non-Obstructive CAD    a. 07/2009 Cath ([facility]): nonobs dzs;  b. 03/2011 Cath [facility]): nonobs dzs.   NSIP (nonspecific interstitial pneumonitis) (HCC) 12/08/2019   Osteoarthritis    a. Knees.   Other shoulder lesions, left shoulder 12/20/2017   Other specified postprocedural states 07/20/2013   Overweight (BMI 25.0-29.9) 08/29/2019   PAD (peripheral artery disease) (HCC)    Pain due to onychomycosis of toenails of both feet 12/05/2018   Pelvic mass in female 03/16/2019   02/21/19 CT pelvis w/o contrast  Cystic left adnexal lesion has enlarged since 2018 where it  measured approximately 1.8 x 1.7 cm. This is not well assessed due  to streak artifact as well as lack of contrast. Ultrasound follow-up  may be helpful in 8-12 weeks      04/05/19 TVUS     IMPRESSION:  Simple appearing cystic structure within the left ovary measuring up  to 2.9 cm. While this has enlarged s   Physical deconditioning 02/27/2021   Pleural effusion on left 12/18/2020   PUD (peptic ulcer disease)    Recurrent falls 09/01/2021   Recurrent pneumonia 07/23/2021   Repeated falls 12/18/2020   Respiratory failure with hypoxia (HCC) 01/12/2021   Rotator cuff tendinitis, right 07/26/2017   S/P lumbar fusion 05/23/2021   S/P right hip fracture    11/01/16 s/p repair   Sepsis due to pneumonia (HCC) 07/22/2021   Shoulder pain    Sleep apnea    no cpap / minimal    SOB (shortness of breath) 10/13/2011   2011 [facility] eval >> LHC (non-obstructive CAD), RHC (wedge 13, mean PA 26), TTE (LVEF > 55%, mild concentric LVH, mild AR, MR, PR, TR), CTAngio chest unrevealing; "well controlled asthma"  02/2011 TTE [facility]>> NL LVEF, Mild LVH, Mod AR/Mod MR   Spinal stenosis at L4-L5 level    severe with L4/L5 anterolisthesis grade 1 anterolisthesis    Spinal stenosis of lumbar region 09/15/2016   Last Assessment & Plan:   Now post op with continued back and leg pain noted. Is working with pt/ot and surgery was said to have no complications. Bowels are moving and general pain and anxiety is basically baseline.    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   Weakness of both lower extremities 12/18/2020    Past Surgical History:  Procedure Laterality Date   APPENDECTOMY     APPLICATION OF INTRAOPERATIVE CT SCAN N/A 05/23/2021   Procedure: APPLICATION OF INTRAOPERATIVE CT SCAN;  Surgeon: Venetia Night, MD;  Location: [facility] ORS;  Service: Neurosurgery;  Laterality: N/A;   BACK SURGERY     lumbar fusion   BUNIONECTOMY Right    CATARACT EXTRACTION, BILATERAL     CESAREAN SECTION     x1   CHOLECYSTECTOMY N/A 05/11/2016   Procedure: LAPAROSCOPIC CHOLECYSTECTOMY;  Surgeon: Lattie Haw, MD;  Location: [facility] ORS;  Service: General;  Laterality: N/A;   COLONOSCOPY WITH PROPOFOL N/A 04/02/2016   Procedure: COLONOSCOPY WITH PROPOFOL;   Surgeon: Wyline Mood, MD;  Location: [facility] END[facility];  Service: Endoscopy;  Laterality: N/A;   ENDOSCOPIC RETROGRADE CHOLANGIOPANCREATOGRAPHY (ERCP) WITH PROPOFOL N/A 05/08/2016   Procedure: ENDOSCOPIC RETROGRADE CHOLANGIOPANCREATOGRAPHY (ERCP) WITH PROPOFOL;  Surgeon: Midge Minium, MD;  Location: [facility];  Service: Endoscopy;  Laterality: N/A;   ERCP     with biliary spincterotomy 05/08/16 Dr. Servando Snare for choledocholithiasis    ESOPHAGEAL DILATION  04/02/2016   Procedure: ESOPHAGEAL DILATION;  Surgeon: Wyline Mood, MD;  Location: [facility];  Service: Endoscopy;;   ESOPHAGOGASTRODUODENOSCOPY (EGD) WITH PROPOFOL N/A 04/02/2016   Procedure: ESOPHAGOGASTRODUODENOSCOPY (EGD) WITH PROPOFOL;  Surgeon: Wyline Mood, MD;  Location: [facility];  Service: Endoscopy;  Laterality: N/A;   HIP ARTHROPLASTY Right 11/01/2016   Procedure: ARTHROPLASTY BIPOLAR HIP (HEMIARTHROPLASTY);  Surgeon: Christena Flake, MD;  Location: [facility];  Service: Orthopedics;  Laterality: Right;   LUMBAR LAMINECTOMY/DECOMPRESSION MICRODISCECTOMY N/A 12/11/2020   Procedure: LEFT L2-3 MICRODISCECTOMY, L3-4 AND L5-S1 DECOMPRESSION;  Surgeon: Venetia Night, MD;  Location: [facility];  Service: Neurosurgery;  Laterality: N/A;   NEPHRECTOMY  1988   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   ORIF SCAPHOID FRACTURE Left 12/21/2019   Procedure: OPEN REDUCTION INTERNAL FIXATION (ORIF) OF LEFT SCAPULAR NONUNION WITH BONE GRAFT;  Surgeon: Christena Flake, MD;  Location: [facility];  Service: Orthopedics;  Laterality: Left;   osteoporosis     noted DEXA 08/19/16    REPLACEMENT TOTAL KNEE Right    REVERSE SHOULDER ARTHROPLASTY Right 11/04/2017   Procedure: REVERSE SHOULDER ARTHROPLASTY;  Surgeon: Christena Flake, MD;  Location: [facility];  Service: Orthopedics;  Laterality: Right;   REVERSE SHOULDER ARTHROPLASTY Left 07/26/2018   Procedure: REVERSE SHOULDER ARTHROPLASTY;  Surgeon: Christena Flake, MD;  Location: [facility];  Service:  Orthopedics;  Laterality: Left;   TONSILLECTOMY     TOTAL HIP ARTHROPLASTY  12/10/11   [facility] left hip   TOTAL HIP ARTHROPLASTY Bilateral    TRANSFORAMINAL LUMBAR INTERBODY FUSION (TLIF) WITH PEDICLE SCREW FIXATION 3 LEVEL N/A 05/23/2021   Procedure: OPEN L2-4 TRANSFORAMINAL LUMBAR INTERBODY FUSION (TLIF) WITH L2-5 POSTERIOR SPINAL FUSION;  Surgeon: Venetia Night, MD;  Location: [facility];  Service: Neurosurgery;  Laterality: N/A;   TUBAL LIGATION      Family History  Problem Relation Age of Onset   Rheum arthritis Mother  Asthma Mother    Parkinson's disease Mother    Heart disease Mother    Stroke Mother    Hypertension Mother    Heart attack Father    Heart disease Father    Hypertension Father    Peripheral Artery Disease Father    Diabetes Son    Gout Son    Asthma Sister    Heart disease Sister    Lung cancer Sister    Heart disease Sister    Heart disease Sister    Breast cancer Sister    Heart attack Sister    Heart disease Brother    Heart disease Maternal Grandmother    Diabetes Maternal Grandmother    Colon cancer Maternal Grandmother    Cancer Maternal Grandmother        Hodgkins lymphoma   Heart disease Brother    Alcohol abuse Brother    Depression Brother    Dementia Son     Social History   Socioeconomic History   Marital status: Widowed    Spouse name: richard   Number of children: 2   Years of education: Some Coll   Highest education level: Some college, no degree  Occupational History   Occupation: Retired    Comment: retired  Tobacco Use   Smoking status: Former    Current packs/day: 0.00    Average packs/day: 0.5 packs/day for 20.0 years (10.0 ttl pk-yrs)    Types: Cigarettes    Start date: 02/02/1954    Quit date: 02/02/1974    Years since quitting: 49.1   Smokeless tobacco: Never  Vaping Use   Vaping status: Never Used  Substance and Sexual Activity   Alcohol use: No   Drug use: No   Sexual activity: Not Currently  Other Topics  Concern   Not on file  Social History Narrative   Lives at home in [place] grandson, his wife and their child.   Husband Loni Delbridge died covid 59 late 1/2069married x 51 years   Right-handed.   6 cups coffee per day.   Social Drivers of Corporate investment banker Strain: Low Risk  (09/22/2022)   Overall Financial Resource Strain (CARDIA)    Difficulty of Paying Living Expenses: Not hard at all  Food Insecurity: No Food Insecurity (02/19/2023)   Hunger Vital Sign    Worried About Running Out of Food in the Last Year: Never true    Ran Out of Food in the Last Year: Never true  Transportation Needs: No Transportation Needs (02/19/2023)   PRAPARE - Administrator, Civil Service (Medical): No    Lack of Transportation (Non-Medical): No  Physical Activity: Inactive (09/22/2022)   Exercise Vital Sign    Days of Exercise per Week: 0 days    Minutes of Exercise per Session: 0 min  Stress: No Stress Concern Present (09/22/2022)   [employer] of Occupational Health - Occupational Stress Questionnaire    Feeling of Stress : Only a little  Social Connections: Socially Isolated (02/12/2023)   Social Connection and Isolation Panel [NHANES]    Frequency of Communication with Friends and Family: More than three times a week    Frequency of Social Gatherings with Friends and Family: More than three times a week    Attends Religious Services: Never    Database administrator or Organizations: No    Attends Banker Meetings: Never    Marital Status: Widowed  Intimate Partner Violence: Not At Risk (02/19/2023)  Humiliation, Afraid, Rape, and Kick questionnaire    Fear of Current or Ex-Partner: No    Emotionally Abused: No    Physically Abused: No    Sexually Abused: No     Outpatient Medications Prior to Visit  Medication Sig Dispense Refill   albuterol (PROVENTIL) (2.5 MG/3ML) 0.083% nebulizer solution Take 3 mLs (2.5 mg total) by nebulization every 6 (six)  hours as needed for wheezing or shortness of breath. 75 mL 12   albuterol (VENTOLIN HFA) 108 (90 Base) MCG/ACT inhaler Inhale 2 puffs into the lungs every 6 (six) hours as needed for wheezing or shortness of breath. 18 g 11   amLODipine (NORVASC) 5 MG tablet Take 1 tablet (5 mg total) by mouth daily. (Patient taking differently: Take 5 mg by mouth in the morning and at bedtime.) 90 tablet 3   apixaban (ELIQUIS) 5 MG TABS tablet Take 5 mg by mouth 2 (two) times daily.     busPIRone (BUSPAR) 5 MG tablet Take 1 tablet (5 mg total) by mouth 2 (two) times daily.     cetirizine (ZYRTEC) 10 MG tablet TAKE 1 TABLET BY MOUTH EVERYDAY AT BEDTIME 90 tablet 3   dicyclomine (BENTYL) 10 MG capsule TAKE 1 CAPSULE (10 MG TOTAL) BY MOUTH 2 (TWO) TIMES DAILY AS NEEDED FOR SPASMS. 180 capsule 2   ENBREL SURECLICK 50 MG/ML injection Inject 50 mg into the skin once a week. Takes on Fridays     escitalopram (LEXAPRO) 10 MG tablet Take 1 tablet (10 mg total) by mouth daily. 90 tablet 3   famotidine (PEPCID) 20 MG tablet TAKE 1 TABLET BY MOUTH EVERYDAY AT BEDTIME 90 tablet 3   Fluticasone-Umeclidin-Vilant (TRELEGY ELLIPTA) 100-62.5-25 MCG/ACT AEPB Inhale 1 puff into the lungs daily. 3 each 3   gabapentin (NEURONTIN) 300 MG capsule Take 300 mg in the morning and 600 mg at night. 90 capsule 1   lamoTRIgine (LAMICTAL) 100 MG tablet TAKE 1 TAB 2 TIMES DAILY. FURTHER REFILLS NEW PSYCH FOR ALL PSYCH MEDS ONLY TEMP SUPPLY FROM PCP 180 tablet 1   leflunomide (ARAVA) 20 MG tablet Take 1 tablet (20 mg total) by mouth daily.     lidocaine (LIDODERM) 5 % Place 1 patch onto the skin 2 (two) times daily as needed. Remove & Discard patch within 12 hours or as directed by MD 120 patch 2   lovastatin (MEVACOR) 20 MG tablet Take 1 tablet (20 mg total) by mouth daily at 6 PM. 90 tablet 3   methocarbamol (ROBAXIN) 500 MG tablet Take 500 mg by mouth 3 (three) times daily.     mometasone (NASONEX) 50 MCG/ACT nasal spray USE 1 SPRAY TO EACH  NOSTRIL TWICE A DAY 51 each 1   montelukast (SINGULAIR) 10 MG tablet TAKE 1 TABLET BY MOUTH EVERY DAY 90 tablet 3   multivitamin-lutein (OCUVITE-LUTEIN) CAPS capsule Take 1 capsule by mouth at bedtime.     nebivolol (BYSTOLIC) 5 MG tablet Take 1 tablet (5 mg total) by mouth in the morning and at bedtime. (Note dose changed from 1/2 10 mg bid to 5 mg bid) 180 tablet 3   OHTUVAYRE 3 MG/2.5ML SUSP Inhale 1 ampule into the lungs in the morning and at bedtime.     ondansetron (ZOFRAN) 4 MG tablet Take 1 tablet (4 mg total) by mouth every 6 (six) hours as needed for nausea. 20 tablet 0   OXYGEN Inhale 3 L into the lungs continuous.     pantoprazole (PROTONIX) 40 MG tablet TAKE  1 TABLET BY MOUTH 2 TIMES DAILY 30 MIN BEFORE FOOD (NOTE REDUCTION IN FREQUENCY) 180 tablet 1   predniSONE (DELTASONE) 5 MG tablet TAKE 1 TABLET BY MOUTH EVERY DAY WITH BREAKFAST 30 tablet 11   PROLIA 60 MG/ML SOSY injection Inject 60 mg into the skin every 6 (six) months.     QUEtiapine (SEROQUEL) 25 MG tablet TAKE 1 TABLET (25 MG TOTAL) BY MOUTH AT BEDTIME. AGAIN LAST FILL FURTHER REFILLS FROM PSYCHIATRY NO EXCEPTIONS 90 tablet 0   QUEtiapine (SEROQUEL) 50 MG tablet Take 50 mg by mouth at bedtime.     sucralfate (CARAFATE) 1 g tablet TAKE 1 TABLET BY MOUTH TWICE A DAY 180 tablet 2   traMADol (ULTRAM) 50 MG tablet Take 1 tablet (50 mg total) by mouth every 12 (twelve) hours as needed. 60 tablet 1   Vibegron (GEMTESA) 75 MG TABS Take 1 tablet (75 mg total) by mouth daily. 90 tablet 3   torsemide (DEMADEX) 20 MG tablet Take 2 tablets (40 mg total) by mouth daily. 60 tablet 0   No facility-administered medications prior to visit.    Allergies  Allergen Reactions   Ceftin [Cefuroxime Axetil] Anaphylaxis   Lisinopril Anaphylaxis   Sulfa Antibiotics Other (See Comments)    Face swelling   Sulfasalazine Anaphylaxis   Morphine Other (See Comments)    Per patient, low blood pressure issues that requires action to raise it back  up. Can take small infrequent doses   Xarelto [Rivaroxaban] Other (See Comments)    Stomach burning, bleeding, and tar in stool   Adhesive [Tape] Rash    Paper tape and tega derm OK   Antihistamines, Chlorpheniramine-Type Other (See Comments)    Makes pt hyper   Antivert [Meclizine Hcl] Other (See Comments)    Bladder will not empty   Aspirin Other (See Comments)    Sulfasalazine allergy cross reacts   Contrast Media [Iodinated Contrast Media] Rash    she is able to use betadine scrubs.   Decongestant [Pseudoephedrine Hcl] Other (See Comments)    Makes pt hyper   Doxycycline Other (See Comments)    GI upset   Levaquin [Levofloxacin In D5w] Rash   Polymyxin B Other (See Comments)    Facial rash   Tetanus Toxoids Rash and Other (See Comments)    Fever and hot to touch at injection site    ROS Review of Systems Negative unless indicated in HPI.    Objective:    Physical Exam  BP 122/66   Pulse 100   Temp 98 F (36.7 C)   Ht 4\' 11"  (1.499 m)   Wt 181 lb 12.8 oz (82.5 kg)   SpO2 97%   BMI 36.72 kg/m  Wt Readings from Last 3 Encounters:  03/05/23 181 lb 12.8 oz (82.5 kg)  02/24/23 174 lb 2.6 oz (79 kg)  02/22/23 175 lb (79.4 kg)     Health Maintenance  Topic Date Due   Medicare Annual Wellness (AWV)  09/22/2023   Pneumonia Vaccine 47+ Years old  Completed   INFLUENZA VACCINE  Completed   DEXA SCAN  Completed   HPV VACCINES  Aged Out   DTaP/Tdap/Td  Discontinued   Colonoscopy  Discontinued   COVID-19 Vaccine  Discontinued   Zoster Vaccines- Shingrix  Discontinued    There are no preventive care reminders to display for this patient.  Lab Results  Component Value Date   TSH 2.53 07/03/2021   Lab Results  Component Value Date   WBC  20.6 (H) 02/15/2023   HGB 9.9 (L) 02/15/2023   HCT 31.0 (L) 02/15/2023   MCV 85.6 02/15/2023   PLT 298 02/15/2023   Lab Results  Component Value Date   NA 138 02/15/2023   K 4.1 02/15/2023   CO2 26 02/15/2023    GLUCOSE 248 (H) 02/15/2023   BUN 38 (H) 02/15/2023   CREATININE 1.25 (H) 02/15/2023   BILITOT 0.6 02/11/2023   ALKPHOS 82 02/11/2023   AST 20 02/11/2023   ALT 17 02/11/2023   PROT 7.4 02/11/2023   ALBUMIN 3.4 (L) 02/11/2023   CALCIUM 8.2 (L) 02/15/2023   ANIONGAP 11 02/15/2023   GFR 35.66 (L) 07/03/2021   Lab Results  Component Value Date   CHOL 176 12/01/2019   Lab Results  Component Value Date   HDL 53.60 12/01/2019   Lab Results  Component Value Date   LDLCALC 60 12/01/2018   Lab Results  Component Value Date   TRIG 360.0 (H) 12/01/2019   Lab Results  Component Value Date   CHOLHDL 3 12/01/2019   Lab Results  Component Value Date   HGBA1C 6.2 (H) 07/17/2022      Assessment & Plan:  Swelling of left thumb Assessment & Plan: Patient reports pain and discoloration in her thumb. -Xray of the thumb ordered.   Orders: -     DG Finger Thumb Left; Future  Laceration of left lower extremity, subsequent encounter Assessment & Plan: Sustained 9 days ago, sutured in the ER. Noted to have some pus-like discharge and mild pain on touch. No fever or systemic symptoms. -Wound Cleaning with saline and dressing. -Return is 2-3 days for dressing change.  -Encourage patient to schedule appointment with ortho for further evaluation. -Advised to keep the foot elevated to reduce swelling.     Follow-up: Return in about 3 days (around 03/08/2023), or if symptoms worsen or fail to improve, for  dressing change and check.   Kara Dies, NP

## 2023-03-07 ENCOUNTER — Encounter: Payer: Self-pay | Admitting: Nurse Practitioner

## 2023-03-07 ENCOUNTER — Other Ambulatory Visit: Payer: Self-pay | Admitting: Physician Assistant

## 2023-03-07 NOTE — Progress Notes (Signed)
Please call pt: You have fracture in your thumb.  I recommend immobilizing it with a splint and seeing a orthopedic specialist as soon as possible.  Kerri Carter, Can you please make sure that pt has an appointment schedule to see the ortho. Her preference is [facility] Dr. Joice Lofts.

## 2023-03-08 ENCOUNTER — Telehealth: Payer: Self-pay

## 2023-03-08 ENCOUNTER — Ambulatory Visit (INDEPENDENT_AMBULATORY_CARE_PROVIDER_SITE_OTHER): Payer: Medicare PPO | Admitting: Podiatry

## 2023-03-08 ENCOUNTER — Ambulatory Visit: Payer: Self-pay | Admitting: Physician Assistant

## 2023-03-08 ENCOUNTER — Ambulatory Visit: Payer: Medicare PPO | Admitting: Internal Medicine

## 2023-03-08 DIAGNOSIS — S81812A Laceration without foreign body, left lower leg, initial encounter: Secondary | ICD-10-CM | POA: Insufficient documentation

## 2023-03-08 DIAGNOSIS — D631 Anemia in chronic kidney disease: Secondary | ICD-10-CM | POA: Diagnosis not present

## 2023-03-08 DIAGNOSIS — G4733 Obstructive sleep apnea (adult) (pediatric): Secondary | ICD-10-CM | POA: Diagnosis not present

## 2023-03-08 DIAGNOSIS — Z91198 Patient's noncompliance with other medical treatment and regimen for other reason: Secondary | ICD-10-CM

## 2023-03-08 DIAGNOSIS — I130 Hypertensive heart and chronic kidney disease with heart failure and stage 1 through stage 4 chronic kidney disease, or unspecified chronic kidney disease: Secondary | ICD-10-CM | POA: Diagnosis not present

## 2023-03-08 DIAGNOSIS — J45901 Unspecified asthma with (acute) exacerbation: Secondary | ICD-10-CM | POA: Diagnosis not present

## 2023-03-08 DIAGNOSIS — M7989 Other specified soft tissue disorders: Secondary | ICD-10-CM | POA: Insufficient documentation

## 2023-03-08 DIAGNOSIS — J9621 Acute and chronic respiratory failure with hypoxia: Secondary | ICD-10-CM | POA: Diagnosis not present

## 2023-03-08 DIAGNOSIS — N1832 Chronic kidney disease, stage 3b: Secondary | ICD-10-CM | POA: Diagnosis not present

## 2023-03-08 DIAGNOSIS — J849 Interstitial pulmonary disease, unspecified: Secondary | ICD-10-CM | POA: Diagnosis not present

## 2023-03-08 DIAGNOSIS — I5042 Chronic combined systolic (congestive) and diastolic (congestive) heart failure: Secondary | ICD-10-CM | POA: Diagnosis not present

## 2023-03-08 DIAGNOSIS — J441 Chronic obstructive pulmonary disease with (acute) exacerbation: Secondary | ICD-10-CM | POA: Diagnosis not present

## 2023-03-08 NOTE — Telephone Encounter (Signed)
Copied from CRM [id number]. Topic: Referral - Request for Referral >> Mar 08, 2023 10:27 AM Leavy Cella D wrote: Did the patient discuss referral with their provider in the last year? No (If No - schedule appointment) (If Yes - send message)  Appointment offered? No  Type of order/referral and detailed reason for visit: Home Health Evaluation   Preference of office, provider, location: n/a  If referral order, have you been seen by this specialty before? No (If Yes, this issue or another issue? When? Where?  Can we respond through MyChart? No

## 2023-03-08 NOTE — Assessment & Plan Note (Signed)
Patient reports pain and discoloration in her thumb. -Xray of the thumb ordered.

## 2023-03-08 NOTE — Assessment & Plan Note (Addendum)
Sustained 9 days ago, sutured in the ER. Noted to have some pus-like discharge and mild pain on touch. No fever or systemic symptoms. -Wound Cleaning with saline and dressing. -Return is 2-3 days for dressing change.  -Encourage patient to schedule appointment with ortho for further evaluation. -Advised to keep the foot elevated to reduce swelling.

## 2023-03-08 NOTE — Telephone Encounter (Signed)
Waiting to hear back from the RN Care manager to see what pt needs.

## 2023-03-09 DIAGNOSIS — I130 Hypertensive heart and chronic kidney disease with heart failure and stage 1 through stage 4 chronic kidney disease, or unspecified chronic kidney disease: Secondary | ICD-10-CM | POA: Diagnosis not present

## 2023-03-09 DIAGNOSIS — J849 Interstitial pulmonary disease, unspecified: Secondary | ICD-10-CM | POA: Diagnosis not present

## 2023-03-09 DIAGNOSIS — I5042 Chronic combined systolic (congestive) and diastolic (congestive) heart failure: Secondary | ICD-10-CM | POA: Diagnosis not present

## 2023-03-09 DIAGNOSIS — J9621 Acute and chronic respiratory failure with hypoxia: Secondary | ICD-10-CM | POA: Diagnosis not present

## 2023-03-09 DIAGNOSIS — N1832 Chronic kidney disease, stage 3b: Secondary | ICD-10-CM | POA: Diagnosis not present

## 2023-03-09 DIAGNOSIS — J441 Chronic obstructive pulmonary disease with (acute) exacerbation: Secondary | ICD-10-CM | POA: Diagnosis not present

## 2023-03-09 DIAGNOSIS — D631 Anemia in chronic kidney disease: Secondary | ICD-10-CM | POA: Diagnosis not present

## 2023-03-09 DIAGNOSIS — J45901 Unspecified asthma with (acute) exacerbation: Secondary | ICD-10-CM | POA: Diagnosis not present

## 2023-03-09 DIAGNOSIS — G4733 Obstructive sleep apnea (adult) (pediatric): Secondary | ICD-10-CM | POA: Diagnosis not present

## 2023-03-09 NOTE — Patient Outreach (Signed)
  Care Management  Transitions of Care Program Transitions of Care Post-discharge Follow up Call  03/09/2023 Name: Kerri Carter MRN: [id number] DOB: [birth year]  Subjective: Kerri Carter is a 83 y.o. year old female who is a primary care patient of Hope Merle, MD. The Care Management team was unable to reach the patient by phone to assess and address transitions of care needs.   Plan: Additional outreach attempts will be made to reach the patient enrolled in the [facility] Program (Post Inpatient/ED Visit).  Medford Balboa, BSN, RN [place]  VBCI - [facility] RN Care Manager [phone]

## 2023-03-09 NOTE — Progress Notes (Signed)
Failure to attend appointment with reason given Appointment canceled.

## 2023-03-10 ENCOUNTER — Telehealth: Payer: Self-pay

## 2023-03-10 NOTE — Patient Outreach (Signed)
 Care Coordination Interventions: Collaborated with the patient and the provider regarding HHSN.  The patient has a wound to her leg that requires dressing changes. She has been going to the PCP office for the wound care but has difficulty due to transportation. Discussed transportation provided by her insurance and she states she doesn't use it because of the long time it takes to get picked up and brought home. Contacted the provider for orders for SN. The patient is currently receiving HHPT through [facility].  Medford Balboa, BSN, RN [place]  VBCI - [employer] [facility] RN Care Manager [phone]

## 2023-03-11 ENCOUNTER — Ambulatory Visit: Payer: Medicare PPO | Admitting: Nurse Practitioner

## 2023-03-11 ENCOUNTER — Telehealth: Payer: Self-pay

## 2023-03-11 ENCOUNTER — Emergency Department: Payer: Medicare PPO

## 2023-03-11 ENCOUNTER — Inpatient Hospital Stay
Admission: EM | Admit: 2023-03-11 | Discharge: 2023-03-13 | DRG: 603 | Disposition: A | Discharge status: Home Health | Payer: Medicare PPO | Source: Ambulatory Visit | Attending: Internal Medicine | Admitting: Internal Medicine

## 2023-03-11 ENCOUNTER — Other Ambulatory Visit: Payer: Self-pay

## 2023-03-11 ENCOUNTER — Encounter: Payer: Self-pay | Admitting: Nurse Practitioner

## 2023-03-11 VITALS — BP 138/84 | HR 101 | Temp 98.1°F | Resp 18 | Ht 59.0 in | Wt 178.5 lb

## 2023-03-11 DIAGNOSIS — R42 Dizziness and giddiness: Principal | ICD-10-CM

## 2023-03-11 DIAGNOSIS — Z96643 Presence of artificial hip joint, bilateral: Secondary | ICD-10-CM | POA: Diagnosis present

## 2023-03-11 DIAGNOSIS — D631 Anemia in chronic kidney disease: Secondary | ICD-10-CM | POA: Diagnosis present

## 2023-03-11 DIAGNOSIS — E785 Hyperlipidemia, unspecified: Secondary | ICD-10-CM | POA: Diagnosis present

## 2023-03-11 DIAGNOSIS — F419 Anxiety disorder, unspecified: Secondary | ICD-10-CM | POA: Diagnosis present

## 2023-03-11 DIAGNOSIS — Z7901 Long term (current) use of anticoagulants: Secondary | ICD-10-CM

## 2023-03-11 DIAGNOSIS — J8489 Other specified interstitial pulmonary diseases: Secondary | ICD-10-CM | POA: Diagnosis present

## 2023-03-11 DIAGNOSIS — Z886 Allergy status to analgesic agent status: Secondary | ICD-10-CM

## 2023-03-11 DIAGNOSIS — I5032 Chronic diastolic (congestive) heart failure: Secondary | ICD-10-CM | POA: Diagnosis present

## 2023-03-11 DIAGNOSIS — R55 Syncope and collapse: Secondary | ICD-10-CM | POA: Diagnosis present

## 2023-03-11 DIAGNOSIS — M069 Rheumatoid arthritis, unspecified: Secondary | ICD-10-CM | POA: Diagnosis present

## 2023-03-11 DIAGNOSIS — Z800 Family history of malignant neoplasm of digestive organs: Secondary | ICD-10-CM

## 2023-03-11 DIAGNOSIS — J9611 Chronic respiratory failure with hypoxia: Secondary | ICD-10-CM | POA: Diagnosis present

## 2023-03-11 DIAGNOSIS — Z9981 Dependence on supplemental oxygen: Secondary | ICD-10-CM

## 2023-03-11 DIAGNOSIS — F3160 Bipolar disorder, current episode mixed, unspecified: Secondary | ICD-10-CM | POA: Diagnosis not present

## 2023-03-11 DIAGNOSIS — L03116 Cellulitis of left lower limb: Principal | ICD-10-CM | POA: Diagnosis present

## 2023-03-11 DIAGNOSIS — Z888 Allergy status to other drugs, medicaments and biological substances status: Secondary | ICD-10-CM

## 2023-03-11 DIAGNOSIS — Z887 Allergy status to serum and vaccine status: Secondary | ICD-10-CM

## 2023-03-11 DIAGNOSIS — F418 Other specified anxiety disorders: Secondary | ICD-10-CM | POA: Diagnosis not present

## 2023-03-11 DIAGNOSIS — E669 Obesity, unspecified: Secondary | ICD-10-CM | POA: Diagnosis not present

## 2023-03-11 DIAGNOSIS — Z91041 Radiographic dye allergy status: Secondary | ICD-10-CM

## 2023-03-11 DIAGNOSIS — G4733 Obstructive sleep apnea (adult) (pediatric): Secondary | ICD-10-CM | POA: Diagnosis not present

## 2023-03-11 DIAGNOSIS — S81812S Laceration without foreign body, left lower leg, sequela: Secondary | ICD-10-CM | POA: Diagnosis not present

## 2023-03-11 DIAGNOSIS — I739 Peripheral vascular disease, unspecified: Secondary | ICD-10-CM | POA: Diagnosis present

## 2023-03-11 DIAGNOSIS — Z881 Allergy status to other antibiotic agents status: Secondary | ICD-10-CM

## 2023-03-11 DIAGNOSIS — J4489 Other specified chronic obstructive pulmonary disease: Secondary | ICD-10-CM | POA: Diagnosis present

## 2023-03-11 DIAGNOSIS — R0602 Shortness of breath: Secondary | ICD-10-CM | POA: Diagnosis not present

## 2023-03-11 DIAGNOSIS — I2510 Atherosclerotic heart disease of native coronary artery without angina pectoris: Secondary | ICD-10-CM | POA: Diagnosis present

## 2023-03-11 DIAGNOSIS — J449 Chronic obstructive pulmonary disease, unspecified: Secondary | ICD-10-CM | POA: Diagnosis not present

## 2023-03-11 DIAGNOSIS — S81812A Laceration without foreign body, left lower leg, initial encounter: Secondary | ICD-10-CM | POA: Diagnosis present

## 2023-03-11 DIAGNOSIS — I350 Nonrheumatic aortic (valve) stenosis: Secondary | ICD-10-CM | POA: Diagnosis present

## 2023-03-11 DIAGNOSIS — I482 Chronic atrial fibrillation, unspecified: Secondary | ICD-10-CM | POA: Diagnosis not present

## 2023-03-11 DIAGNOSIS — Z1152 Encounter for screening for COVID-19: Secondary | ICD-10-CM | POA: Diagnosis not present

## 2023-03-11 DIAGNOSIS — M810 Age-related osteoporosis without current pathological fracture: Secondary | ICD-10-CM | POA: Diagnosis present

## 2023-03-11 DIAGNOSIS — Z66 Do not resuscitate: Secondary | ICD-10-CM | POA: Diagnosis present

## 2023-03-11 DIAGNOSIS — Z8249 Family history of ischemic heart disease and other diseases of the circulatory system: Secondary | ICD-10-CM

## 2023-03-11 DIAGNOSIS — N1832 Chronic kidney disease, stage 3b: Secondary | ICD-10-CM | POA: Diagnosis present

## 2023-03-11 DIAGNOSIS — R531 Weakness: Secondary | ICD-10-CM | POA: Diagnosis present

## 2023-03-11 DIAGNOSIS — Z882 Allergy status to sulfonamides status: Secondary | ICD-10-CM

## 2023-03-11 DIAGNOSIS — Z807 Family history of other malignant neoplasms of lymphoid, hematopoietic and related tissues: Secondary | ICD-10-CM

## 2023-03-11 DIAGNOSIS — Z6836 Body mass index (BMI) 36.0-36.9, adult: Secondary | ICD-10-CM | POA: Diagnosis not present

## 2023-03-11 DIAGNOSIS — L03119 Cellulitis of unspecified part of limb: Secondary | ICD-10-CM | POA: Diagnosis present

## 2023-03-11 DIAGNOSIS — I130 Hypertensive heart and chronic kidney disease with heart failure and stage 1 through stage 4 chronic kidney disease, or unspecified chronic kidney disease: Secondary | ICD-10-CM | POA: Diagnosis present

## 2023-03-11 DIAGNOSIS — G9389 Other specified disorders of brain: Secondary | ICD-10-CM | POA: Diagnosis not present

## 2023-03-11 DIAGNOSIS — Z803 Family history of malignant neoplasm of breast: Secondary | ICD-10-CM

## 2023-03-11 DIAGNOSIS — I10 Essential (primary) hypertension: Secondary | ICD-10-CM | POA: Diagnosis present

## 2023-03-11 DIAGNOSIS — Z96611 Presence of right artificial shoulder joint: Secondary | ICD-10-CM | POA: Diagnosis present

## 2023-03-11 DIAGNOSIS — Z905 Acquired absence of kidney: Secondary | ICD-10-CM

## 2023-03-11 DIAGNOSIS — Z9049 Acquired absence of other specified parts of digestive tract: Secondary | ICD-10-CM

## 2023-03-11 DIAGNOSIS — Z833 Family history of diabetes mellitus: Secondary | ICD-10-CM

## 2023-03-11 DIAGNOSIS — Z87891 Personal history of nicotine dependence: Secondary | ICD-10-CM

## 2023-03-11 DIAGNOSIS — Z96651 Presence of right artificial knee joint: Secondary | ICD-10-CM | POA: Diagnosis present

## 2023-03-11 DIAGNOSIS — Z885 Allergy status to narcotic agent status: Secondary | ICD-10-CM

## 2023-03-11 DIAGNOSIS — R000 Tachycardia, unspecified: Secondary | ICD-10-CM | POA: Diagnosis present

## 2023-03-11 DIAGNOSIS — Z801 Family history of malignant neoplasm of trachea, bronchus and lung: Secondary | ICD-10-CM

## 2023-03-11 LAB — URINALYSIS, ROUTINE W REFLEX MICROSCOPIC
Bilirubin Urine: NEGATIVE
Glucose, UA: NEGATIVE mg/dL
Hgb urine dipstick: NEGATIVE
Ketones, ur: NEGATIVE mg/dL
Leukocytes,Ua: NEGATIVE
Nitrite: NEGATIVE
Protein, ur: NEGATIVE mg/dL
Specific Gravity, Urine: 1.006 (ref 1.005–1.030)
pH: 7 (ref 5.0–8.0)

## 2023-03-11 LAB — BASIC METABOLIC PANEL
Anion gap: 17 — ABNORMAL HIGH (ref 5–15)
BUN: 18 mg/dL (ref 8–23)
CO2: 24 mmol/L (ref 22–32)
Calcium: 8.9 mg/dL (ref 8.9–10.3)
Chloride: 98 mmol/L (ref 98–111)
Creatinine, Ser: 1.2 mg/dL — ABNORMAL HIGH (ref 0.44–1.00)
GFR, Estimated: 46 mL/min — ABNORMAL LOW (ref >60)
Glucose, Bld: 154 mg/dL — ABNORMAL HIGH (ref 70–99)
Potassium: 3.7 mmol/L (ref 3.5–5.1)
Sodium: 139 mmol/L (ref 135–145)

## 2023-03-11 LAB — LACTIC ACID, PLASMA: Lactic Acid, Venous: 1.8 mmol/L (ref 0.5–1.9)

## 2023-03-11 LAB — CBC
HCT: 32.6 % — ABNORMAL LOW (ref 36.0–46.0)
Hemoglobin: 10 g/dL — ABNORMAL LOW (ref 12.0–15.0)
MCH: 27.1 pg (ref 26.0–34.0)
MCHC: 30.7 g/dL (ref 30.0–36.0)
MCV: 88.3 fL (ref 80.0–100.0)
Platelets: 372 10*3/uL (ref 150–400)
RBC: 3.69 MIL/uL — ABNORMAL LOW (ref 3.87–5.11)
RDW: 15.8 % — ABNORMAL HIGH (ref 11.5–15.5)
WBC: 13.9 10*3/uL — ABNORMAL HIGH (ref 4.0–10.5)
nRBC: 0.4 % — ABNORMAL HIGH (ref 0.0–0.2)

## 2023-03-11 LAB — BRAIN NATRIURETIC PEPTIDE: B Natriuretic Peptide: 277 pg/mL — ABNORMAL HIGH (ref 0.0–100.0)

## 2023-03-11 LAB — PROCALCITONIN: Procalcitonin: <0.1 ng/mL

## 2023-03-11 LAB — RESP PANEL BY RT-PCR (RSV, FLU A&B, COVID)  RVPGX2
Influenza A by PCR: NEGATIVE
Influenza B by PCR: NEGATIVE
Resp Syncytial Virus by PCR: NEGATIVE
SARS Coronavirus 2 by RT PCR: NEGATIVE

## 2023-03-11 MED ORDER — SODIUM CHLORIDE 0.9 % IV SOLN
2.0000 g | INTRAVENOUS | Status: DC
  Administered 2023-03-12 – 2023-03-13 (×2): 2 g via INTRAVENOUS
  Filled 2023-03-11 (×3): qty 20

## 2023-03-11 MED ORDER — METOPROLOL TARTRATE 5 MG/5ML IV SOLN: 2.5000 mg | INTRAVENOUS | Status: DC | PRN

## 2023-03-11 MED ORDER — HYDRALAZINE HCL 20 MG/ML IJ SOLN: 5.0000 mg | INTRAMUSCULAR | Status: DC | PRN

## 2023-03-11 MED ORDER — ALBUTEROL SULFATE (2.5 MG/3ML) 0.083% IN NEBU: 3.0000 mL | INHALATION_SOLUTION | RESPIRATORY_TRACT | Status: DC | PRN

## 2023-03-11 MED ORDER — SODIUM CHLORIDE 0.9 % IV SOLN
2.0000 g | Freq: Once | INTRAVENOUS | Status: AC
  Administered 2023-03-11: 2 g via INTRAVENOUS
  Filled 2023-03-11: qty 20

## 2023-03-11 MED ORDER — ACETAMINOPHEN 325 MG PO TABS
650.0000 mg | ORAL_TABLET | Freq: Four times a day (QID) | ORAL | Status: DC | PRN
  Administered 2023-03-12 – 2023-03-13 (×2): 650 mg via ORAL
  Filled 2023-03-11 (×2): qty 2

## 2023-03-11 MED ORDER — VANCOMYCIN HCL IN DEXTROSE 1-5 GM/200ML-% IV SOLN
1000.0000 mg | INTRAVENOUS | Status: DC
  Filled 2023-03-11: qty 200

## 2023-03-11 MED ORDER — DM-GUAIFENESIN ER 30-600 MG PO TB12: 1.0000 | ORAL_TABLET | Freq: Two times a day (BID) | ORAL | Status: DC | PRN

## 2023-03-11 MED ORDER — CLINDAMYCIN HCL 300 MG PO CAPS: 300.0000 mg | ORAL_CAPSULE | Freq: Three times a day (TID) | ORAL | 0 refills | Status: DC

## 2023-03-11 MED ORDER — LACTATED RINGERS IV BOLUS (SEPSIS)
1000.0000 mL | Freq: Once | INTRAVENOUS | Status: AC
  Administered 2023-03-12: 1000 mL via INTRAVENOUS

## 2023-03-11 MED ORDER — ONDANSETRON HCL 4 MG/2ML IJ SOLN: 4.0000 mg | Freq: Three times a day (TID) | INTRAMUSCULAR | Status: DC | PRN

## 2023-03-11 MED ORDER — LACTATED RINGERS IV SOLN: INTRAVENOUS | Status: AC

## 2023-03-11 MED ORDER — VANCOMYCIN HCL 1750 MG/350ML IV SOLN
1750.0000 mg | Freq: Once | INTRAVENOUS | Status: AC
  Administered 2023-03-11: 1750 mg via INTRAVENOUS
  Filled 2023-03-11: qty 350

## 2023-03-11 MED ORDER — ACETAMINOPHEN 500 MG PO TABS
1000.0000 mg | ORAL_TABLET | Freq: Once | ORAL | Status: AC
  Administered 2023-03-11: 1000 mg via ORAL
  Filled 2023-03-11: qty 2

## 2023-03-11 NOTE — ED Provider Triage Note (Signed)
 Emergency Medicine Provider Triage Evaluation Note  Kerri Carter , a 83 y.o. female  was evaluated in triage.  Pt complains of dizziness while at doctors office today. Sent to ED for further evaluation. Pt complains of generalized weakness.  Review of Systems  Positive:  Negative:   Physical Exam  BP (!) 148/80 (BP Location: Left Arm)   Pulse (!) 109   Temp 98.8 F (37.1 C) (Oral)   Resp 18   SpO2 98%  Gen:   Awake, no distress   Resp:  Normal effort  MSK:   Moves extremities without difficulty  Other:    Medical Decision Making  Medically screening exam initiated at 3:04 PM.  Appropriate orders placed.  [name] was informed that the remainder of the evaluation will be completed by another provider, this initial triage assessment does not replace that evaluation, and the importance of remaining in the ED until their evaluation is complete.    [name], Katalena Malveaux A, PA-C 03/11/23 [phone]

## 2023-03-11 NOTE — ED Triage Notes (Signed)
 Pt to Ed via [facility] from PCP. Pt reports was at doctors appointment and started feeling dizzy. Pt reports fell 2 wks ago. PT has bruising to left side of face. Pt is on 3L [place] chronically. Pt's PCP did EKG showing HR 118. Pt reports HA

## 2023-03-11 NOTE — H&P (Signed)
 History and Physical    Kerri Carter FMW:[id number] DOB: [birth year] DOA: 03/11/2023  Referring MD/NP/PA:   PCP: Hope Merle, MD   Patient coming from:  The patient is coming from home.     Chief Complaint: left leg pain and dizziness  HPI: Kerri Carter is a 83 y.o. female with medical history significant of ILD and COPD on 3 L oxygen , hypertension, hyperlipidemia, PVD, CAD, dCHF, depression with anxiety, bipolar, CKD-3B, A-fib on Eliquis , OSA on CPAP, obesity, who presents with left leg pain and dizziness.  Pt states that she had fall with left lower extremity laceration 1/22.  She was seen in ED and required multiple sutures.  Pt visited her PCP earlier today for dressing change to left lower extremity. Pt developed tachycardia with heart rate up to 169 with dizziness per pt.  She was sent to ED for further evaluation and treatment. Her heart rate was 140s initially which has improved to 90s in ED. Her dizziness has significantly improved.  She states that she still feels dizzy when standing up.  Denies chest pain, cough, SOB.  No nausea, vomiting or diarrhea or abdominal pain.  Denies symptoms of UTI. She reports redness, purulent drainage and moderate pain in left leg wound despite taking clindamycin  antibiotics.  No fever or chills.  Data reviewed independently and ED Course: pt was found to have WBC 13.9, BNP 277, lactic acid 1.8, procalcitonin <0.10, renal function close to baseline, negative PCR for COVID, flu and RSV. Temperature normal, blood pressure 180/91, RR 24, oxygen  saturation 99% on room air.  CT head negative for acute intracranial abnormalities.  Patient is admitted to telemetry bed as inpatient.  EKG: I have personally reviewed.  Sinus rhythm, heart rate of 105, early R wave progression.   Review of Systems:   General: no fevers, chills, no body weight gain, has fatigue HEENT: no blurry vision, hearing changes or sore throat Respiratory: no dyspnea, coughing,  wheezing CV: no chest pain, no palpitations GI: no nausea, vomiting, abdominal pain, diarrhea, constipation GU: no dysuria, burning on urination, increased urinary frequency, hematuria  Ext: has trace leg edema Neuro: no unilateral weakness, numbness, or tingling, no vision change or hearing loss. Has dizziness Skin: has laceration and pain in left lower leg MSK: No muscle spasm, no deformity, no limitation of range of movement in spin Heme: No easy bruising.  Travel history: No recent long distant travel.   Allergy:  Allergies  Allergen Reactions   Ceftin [Cefuroxime Axetil] Anaphylaxis   Lisinopril Anaphylaxis   Sulfa Antibiotics Other (See Comments)    Face swelling   Sulfasalazine Anaphylaxis   Morphine  Other (See Comments)    Per patient, low blood pressure issues that requires action to raise it back up. Can take small infrequent doses   Xarelto [Rivaroxaban] Other (See Comments)    Stomach burning, bleeding, and tar in stool   Adhesive [Tape] Rash    Paper tape and tega derm OK   Antihistamines, Chlorpheniramine-Type Other (See Comments)    Makes pt hyper   Antivert [Meclizine Hcl] Other (See Comments)    Bladder will not empty   Aspirin  Other (See Comments)    Sulfasalazine allergy cross reacts   Contrast Media [Iodinated Contrast Media] Rash    she is able to use betadine scrubs.   Decongestant [Pseudoephedrine Hcl] Other (See Comments)    Makes pt hyper   Doxycycline Other (See Comments)    GI upset   Levaquin  [Levofloxacin  In D5w] Rash  Polymyxin B  Other (See Comments)    Facial rash   Tetanus Toxoids Rash and Other (See Comments)    Fever and hot to touch at injection site    Past Medical History:  Diagnosis Date   Acromial process of scapula fracture 12/21/2019   Acute kidney injury superimposed on chronic kidney disease (HCC) 05/26/2021   Acute on chronic combined systolic (congestive) and diastolic (congestive) heart failure (HCC) 07/28/2021   Acute  on chronic combined systolic and diastolic CHF (congestive heart failure) (HCC) 05/26/2021   Acute on chronic respiratory failure with hypoxia (HCC) 09/10/2021   Acute pain of left knee 12/18/2020   Anemia in stage 3a chronic kidney disease (HCC) 10/09/2021   Anxiety    Anxiety 01/20/2019   Anxiety and depression 05/26/2021   Aortic stenosis 07/09/2015   a.) TTE 07/06/2015: EF 55-60%; mild AS with MPG 13.0 mmHg.   Arrhythmia    atrial fibrillation   Assistance needed for mobility 12/18/2020   Asthma    At high risk for falls 03/16/2019   Avascular necrosis of left shoulder due to adverse effect of steroid therapy (HCC) 01/20/2018   Bipolar disorder (HCC)    C. difficile diarrhea 2010   a.) following ABX course during hospital admission   Carotid atherosclerosis, bilateral    a.) Moderate; < 50% stenosis BILATERAL ICAs.   Cataract    a.) s/p BILATERAL extraction   CHF (congestive heart failure) (HCC)    Chicken pox    Chronic left shoulder pain 08/29/2019   Chronic respiratory failure with hypoxia (HCC) 10/01/2021   CKD (chronic kidney disease), stage III (HCC)    a. s/p R nephrectomy./ aneurysm   Closed fracture of acromial process of scapula 10/28/2018   Closed nondisplaced fracture of proximal phalanx of lesser toe of right foot    Collapsed vertebra, not elsewhere classified, lumbar region, initial encounter for fracture (HCC) 02/21/2019   Community acquired pneumonia 07/22/2021   Complicated grief 03/16/2019   Compression fracture of L4 vertebra (HCC) 02/21/2019   Conversion disorder    Conversion disorder 08/04/2015   COPD (chronic obstructive pulmonary disease) (HCC)    COPD with exacerbation (HCC) 08/20/2021   Cough 05/02/2012   Depression    Dislocation of hip joint prosthesis (HCC) 01/08/2017   Elevated brain natriuretic peptide (BNP) level 12/18/2020   Elevated d-dimer 05/26/2021   Episodic mood disorder (HCC) 04/18/2019   Essential hypertension    Fatigue  07/07/2018   Frequent falls 12/18/2020   GERD (gastroesophageal reflux disease)    H/O cardiac catheterization 02/24/2011   Formatting of this note might be different from the original.  Repeated at [facility] 2/13 and insignificant disease   Hav (hallux abducto valgus), unspecified laterality 12/05/2018   Heart murmur    Hip fracture (HCC) 10/31/2016   Hip pain 12/18/2020   History of fracture of left hip 01/15/2017   History of syncope 08/25/2016   Hyperkalemia 05/26/2021   - The patient was given p.o. Lokelma  and IV Lasix .  - We will follow potassium level.   Hyperlipidemia    Hypokalemia 08/25/2016   ILD (interstitial lung disease) (HCC)    mild; 2/2 RA diagnosis   Iliopsoas bursitis of right hip 08/29/2019   Inflammatory arthritis    a. hands/carpal tunnel.  b. Low titer rheumatoid factor. c. Negative anti-CCP antibodies. d. Plaquenil.   Insomnia 07/17/2015   Intractable nausea and vomiting 08/10/2022   Left foot pain 10/19/2016   Left lower lobe pneumonia 05/26/2021  Leg edema 05/02/2019   Leg pain, bilateral    Leg swelling 12/15/2016   Leukocytosis 10/19/2017   Leukopenia 06/24/2021   Lymphocytosis 07/13/2021   Macular degeneration    Microcytic anemia 08/15/2015   Mixed bipolar I disorder (HCC) 10/09/2015   Nocturnal hypoxemia    Nocturnal hypoxemia 09/09/2011   June 2013 overnight oximetry on 6 centimeters of water  CPAP: SpO2 less than 88% 72% of the time   Non-Obstructive CAD    a. 07/2009 Cath ([facility]): nonobs dzs;  b. 03/2011 Cath [facility]): nonobs dzs.   NSIP (nonspecific interstitial pneumonitis) (HCC) 12/08/2019   Osteoarthritis    a. Knees.   Other shoulder lesions, left shoulder 12/20/2017   Other specified postprocedural states 07/20/2013   Overweight (BMI 25.0-29.9) 08/29/2019   PAD (peripheral artery disease) (HCC)    Pain due to onychomycosis of toenails of both feet 12/05/2018   Pelvic mass in female 03/16/2019   02/21/19 CT pelvis w/o contrast  Cystic  left adnexal lesion has enlarged since 2018 where it  measured approximately 1.8 x 1.7 cm. This is not well assessed due  to streak artifact as well as lack of contrast. Ultrasound follow-up  may be helpful in 8-12 weeks      04/05/19 TVUS     IMPRESSION:  Simple appearing cystic structure within the left ovary measuring up  to 2.9 cm. While this has enlarged s   Physical deconditioning 02/27/2021   Pleural effusion on left 12/18/2020   PUD (peptic ulcer disease)    Recurrent falls 09/01/2021   Recurrent pneumonia 07/23/2021   Repeated falls 12/18/2020   Respiratory failure with hypoxia (HCC) 01/12/2021   Rotator cuff tendinitis, right 07/26/2017   S/P lumbar fusion 05/23/2021   S/P right hip fracture    11/01/16 s/p repair   Sepsis due to pneumonia (HCC) 07/22/2021   Shoulder pain    Sleep apnea    no cpap / minimal   SOB (shortness of breath) 10/13/2011   2011 [facility] eval >> LHC (non-obstructive CAD), RHC (wedge 13, mean PA 26), TTE (LVEF > 55%, mild concentric LVH, mild AR, MR, PR, TR), CTAngio chest unrevealing; well controlled asthma  02/2011 TTE [facility]>> NL LVEF, Mild LVH, Mod AR/Mod MR   Spinal stenosis at L4-L5 level    severe with L4/L5 anterolisthesis grade 1 anterolisthesis    Spinal stenosis of lumbar region 09/15/2016   Last Assessment & Plan:   Now post op with continued back and leg pain noted. Is working with pt/ot and surgery was said to have no complications. Bowels are moving and general pain and anxiety is basically baseline.    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   Weakness of both lower extremities 12/18/2020    Past Surgical History:  Procedure Laterality Date   APPENDECTOMY     APPLICATION OF INTRAOPERATIVE CT SCAN N/A 05/23/2021   Procedure: APPLICATION OF INTRAOPERATIVE CT SCAN;  Surgeon: Clois Fret, MD;  Location:  [facility];  Service: Neurosurgery;  Laterality: N/A;   BACK SURGERY     lumbar fusion   BUNIONECTOMY Right    CATARACT EXTRACTION, BILATERAL     CESAREAN SECTION     x1   CHOLECYSTECTOMY N/A 05/11/2016   Procedure: LAPAROSCOPIC CHOLECYSTECTOMY;  Surgeon: Charlie FORBES Fell, MD;  Location: [facility];  Service: General;  Laterality: N/A;   COLONOSCOPY WITH PROPOFOL  N/A 04/02/2016   Procedure: COLONOSCOPY WITH PROPOFOL ;  Surgeon: Ruel Kung, MD;  Location: [facility];  Service: Endoscopy;  Laterality: N/A;   ENDOSCOPIC RETROGRADE CHOLANGIOPANCREATOGRAPHY (ERCP) WITH PROPOFOL  N/A 05/08/2016   Procedure: ENDOSCOPIC RETROGRADE CHOLANGIOPANCREATOGRAPHY (ERCP) WITH PROPOFOL ;  Surgeon: Rogelia Copping, MD;  Location: [facility];  Service: Endoscopy;  Laterality: N/A;   ERCP     with biliary spincterotomy 05/08/16 Dr. Copping for choledocholithiasis    ESOPHAGEAL DILATION  04/02/2016   Procedure: ESOPHAGEAL DILATION;  Surgeon: Ruel Kung, MD;  Location: [facility];  Service: Endoscopy;;   ESOPHAGOGASTRODUODENOSCOPY (EGD) WITH PROPOFOL  N/A 04/02/2016   Procedure: ESOPHAGOGASTRODUODENOSCOPY (EGD) WITH PROPOFOL ;  Surgeon: Ruel Kung, MD;  Location: [facility];  Service: Endoscopy;  Laterality: N/A;   HIP ARTHROPLASTY Right 11/01/2016   Procedure: ARTHROPLASTY BIPOLAR HIP (HEMIARTHROPLASTY);  Surgeon: Edie Norleen PARAS, MD;  Location: [facility];  Service: Orthopedics;  Laterality: Right;   LUMBAR LAMINECTOMY/DECOMPRESSION MICRODISCECTOMY N/A 12/11/2020   Procedure: LEFT L2-3 MICRODISCECTOMY, L3-4 AND L5-S1  DECOMPRESSION;  Surgeon: Clois Fret, MD;  Location: [facility];  Service: Neurosurgery;  Laterality: N/A;   NEPHRECTOMY  1988   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   ORIF SCAPHOID FRACTURE Left 12/21/2019   Procedure: OPEN REDUCTION INTERNAL FIXATION (ORIF) OF LEFT SCAPULAR NONUNION WITH BONE GRAFT;  Surgeon: Edie Norleen PARAS, MD;  Location: [facility];  Service: Orthopedics;  Laterality: Left;   osteoporosis     noted DEXA 08/19/16    REPLACEMENT TOTAL KNEE Right    REVERSE SHOULDER ARTHROPLASTY Right 11/04/2017   Procedure: REVERSE SHOULDER ARTHROPLASTY;  Surgeon: Edie Norleen PARAS, MD;  Location: [facility];  Service: Orthopedics;  Laterality: Right;   REVERSE SHOULDER ARTHROPLASTY Left 07/26/2018   Procedure: REVERSE SHOULDER ARTHROPLASTY;  Surgeon: Edie Norleen PARAS, MD;  Location: [facility];  Service: Orthopedics;  Laterality: Left;   TONSILLECTOMY     TOTAL HIP ARTHROPLASTY  12/10/11   [facility] left hip   TOTAL HIP ARTHROPLASTY Bilateral    TRANSFORAMINAL LUMBAR INTERBODY FUSION (TLIF) WITH PEDICLE SCREW FIXATION 3 LEVEL N/A 05/23/2021   Procedure: OPEN L2-4 TRANSFORAMINAL LUMBAR INTERBODY FUSION (TLIF) WITH L2-5 POSTERIOR SPINAL FUSION;  Surgeon: Clois Fret, MD;  Location: [facility];  Service: Neurosurgery;  Laterality: N/A;   TUBAL LIGATION      Social History:  reports that she quit smoking about 49 years ago. Her smoking use included cigarettes. She started smoking about 69 years ago. She has a 10 pack-year smoking history. She has never used smokeless tobacco. She reports that she does not drink alcohol  and does not use drugs.  Family History:  Family History  Problem Relation Age of Onset   Rheum arthritis Mother    Asthma Mother    Parkinson's disease Mother    Heart disease Mother    Stroke Mother    Hypertension Mother    Heart attack Father    Heart disease Father    Hypertension Father    Peripheral Artery Disease Father    Diabetes Son    Gout  Son    Asthma  Sister    Heart disease Sister    Lung cancer Sister    Heart disease Sister    Heart disease Sister    Breast cancer Sister    Heart attack Sister    Heart disease Brother    Heart disease Maternal Grandmother    Diabetes Maternal Grandmother    Colon cancer Maternal Grandmother    Cancer Maternal Grandmother        Hodgkins lymphoma   Heart disease Brother    Alcohol  abuse Brother    Depression Brother    Dementia Son      Prior to Admission medications   Medication Sig Start Date End Date Taking? Authorizing Provider  albuterol  (PROVENTIL ) (2.5 MG/3ML) 0.083% nebulizer solution Take 3 mLs (2.5 mg total) by nebulization every 6 (six) hours as needed for wheezing or shortness of breath. 11/17/22   Tamea Dedra CROME, MD  albuterol  (VENTOLIN  HFA) [phone] Base) MCG/ACT inhaler Inhale 2 puffs into the lungs every 6 (six) hours as needed for wheezing or shortness of breath. 06/24/21   McLean-Scocuzza, Randine SAILOR, MD  amLODipine  (NORVASC ) 5 MG tablet Take 1 tablet (5 mg total) by mouth daily. Patient taking differently: Take 5 mg by mouth in the morning and at bedtime. 08/01/21   McLean-Scocuzza, Randine SAILOR, MD  apixaban  (ELIQUIS ) 5 MG TABS tablet Take 5 mg by mouth 2 (two) times daily. 12/10/22   [provider]  busPIRone  (BUSPAR ) 5 MG tablet Take 1 tablet (5 mg total) by mouth 2 (two) times daily. 06/02/21   Jens Durand, MD  cetirizine  (ZYRTEC ) 10 MG tablet TAKE 1 TABLET BY MOUTH EVERYDAY AT BEDTIME 06/08/22   Tamea Dedra CROME, MD  clindamycin  (CLEOCIN ) 300 MG capsule Take 1 capsule (300 mg total) by mouth 3 (three) times daily for 5 days. 03/11/23 03/16/23  Kaur, Charanpreet, NP  dicyclomine  (BENTYL ) 10 MG capsule TAKE 1 CAPSULE (10 MG TOTAL) BY MOUTH 2 (TWO) TIMES DAILY AS NEEDED FOR SPASMS. 10/02/22   Anna, Kiran, MD  ENBREL SURECLICK 50 MG/ML injection Inject 50 mg into the skin once a week. Takes on Fridays    [provider]  escitalopram  (LEXAPRO ) 10 MG  tablet Take 1 tablet (10 mg total) by mouth daily. 08/01/21   McLean-Scocuzza, Randine SAILOR, MD  famotidine  (PEPCID ) 20 MG tablet TAKE 1 TABLET BY MOUTH EVERYDAY AT BEDTIME 10/08/22   Therisa Bi, MD  Fluticasone -Umeclidin-Vilant (TRELEGY ELLIPTA ) 100-62.5-25 MCG/ACT AEPB Inhale 1 puff into the lungs daily. 12/01/22   Tamea Dedra CROME, MD  gabapentin  (NEURONTIN ) 300 MG capsule Take 300 mg in the morning and 600 mg at night. 01/11/23   Hope Merle, MD  GEMTESA  75 MG TABS TAKE 75 MG BY MOUTH DAILY. 03/08/23   Vaillancourt, Samantha, PA-C  lamoTRIgine  (LAMICTAL ) 100 MG tablet TAKE 1 TAB 2 TIMES DAILY. FURTHER REFILLS NEW PSYCH FOR ALL PSYCH MEDS ONLY TEMP SUPPLY FROM PCP 05/02/21   McLean-Scocuzza, Randine SAILOR, MD  leflunomide  (ARAVA ) 20 MG tablet Take 1 tablet (20 mg total) by mouth daily. 09/30/17   McLean-Scocuzza, Randine SAILOR, MD  lidocaine  (LIDODERM ) 5 % Place 1 patch onto the skin 2 (two) times daily as needed. Remove & Discard patch within 12 hours or as directed by MD 11/22/20   McLean-Scocuzza, Randine SAILOR, MD  lovastatin  (MEVACOR ) 20 MG tablet Take 1 tablet (20 mg total) by mouth daily at 6 PM. 08/04/22   Hope Merle, MD  methocarbamol  (ROBAXIN ) 500 MG tablet Take 500 mg by mouth 3 (  three) times daily.    [provider]  mometasone  (NASONEX ) 50 MCG/ACT nasal spray USE 1 SPRAY TO EACH NOSTRIL TWICE A DAY 10/08/22   Tamea Dedra CROME, MD  montelukast  (SINGULAIR ) 10 MG tablet TAKE 1 TABLET BY MOUTH EVERY DAY 11/12/22   Tamea Dedra CROME, MD  multivitamin-lutein  (OCUVITE-LUTEIN ) CAPS capsule Take 1 capsule by mouth at bedtime.    [provider]  nebivolol  (BYSTOLIC ) 5 MG tablet Take 1 tablet (5 mg total) by mouth in the morning and at bedtime. (Note dose changed from 1/2 10 mg bid to 5 mg bid) 08/01/21   McLean-Scocuzza, Randine SAILOR, MD  OHTUVAYRE  3 MG/2.5ML SUSP Inhale 1 ampule into the lungs in the morning and at bedtime. 12/25/22   [provider]  ondansetron  (ZOFRAN ) 4 MG tablet Take 1 tablet  (4 mg total) by mouth every 6 (six) hours as needed for nausea. 08/14/22   Fausto Sor A, DO  OXYGEN  Inhale 3 L into the lungs continuous.    [provider]  pantoprazole  (PROTONIX ) 40 MG tablet TAKE 1 TABLET BY MOUTH 2 TIMES DAILY 30 MIN BEFORE FOOD (NOTE REDUCTION IN FREQUENCY) 02/15/23   Hope Merle, MD  predniSONE  (DELTASONE ) 5 MG tablet TAKE 1 TABLET BY MOUTH EVERY DAY WITH BREAKFAST 01/04/23   Tamea Dedra CROME, MD  PROLIA  60 MG/ML SOSY injection Inject 60 mg into the skin every 6 (six) months. 03/09/22   [provider]  QUEtiapine  (SEROQUEL ) 25 MG tablet TAKE 1 TABLET (25 MG TOTAL) BY MOUTH AT BEDTIME. AGAIN LAST FILL FURTHER REFILLS FROM PSYCHIATRY NO EXCEPTIONS 09/13/20   McLean-Scocuzza, Randine SAILOR, MD  QUEtiapine  (SEROQUEL ) 50 MG tablet Take 50 mg by mouth at bedtime. 02/10/23   [provider]  sucralfate  (CARAFATE ) 1 g tablet TAKE 1 TABLET BY MOUTH TWICE A DAY 11/02/22   Therisa Bi, MD  torsemide  (DEMADEX ) 20 MG tablet Take 2 tablets (40 mg total) by mouth daily. 07/19/22 09/15/22  Jhonny Calvin NOVAK, MD  traMADol  (ULTRAM ) 50 MG tablet Take 1 tablet (50 mg total) by mouth every 12 (twelve) hours as needed. 07/28/22   Marcelino Nurse, MD    Physical Exam: Vitals:   03/11/23 1446 03/11/23 1805 03/11/23 1830 03/11/23 1922  BP: (!) 148/80 (!) 176/82 (!) 180/91 (!) 168/81  Pulse: (!) 109 (!) 109 99 99  Resp: 18 (!) 24 20 18   Temp: 98.8 F (37.1 C)   98.6 F (37 C)  TempSrc: Oral     SpO2: 98% 99% 99% 95%   General: Not in acute distress HEENT:       Eyes: PERRL, EOMI, no jaundice       ENT: No discharge from the ears and nose, no pharynx injection, no tonsillar enlargement.        Neck: No JVD, no bruit, no mass felt. Heme: No neck lymph node enlargement. Cardiac: S1/S2, RRR, No murmurs, No gallops or rubs. Respiratory: No rales, wheezing, rhonchi or rubs. GI: Soft, nondistended, nontender, no rebound pain, no organomegaly, BS present. GU: No  hematuria Ext: has trace leg edema bilaterally. 1+DP/PT pulse bilaterally. Musculoskeletal: No joint deformities, No joint redness or warmth, no limitation of ROM in spin. Skin:  has a history sutured laceration in left lower leg with tenderness, warmth, surrounding erythema     Neuro: Alert, oriented X3, cranial nerves II-XII grossly intact, moves all extremities normally Psych: Patient is not psychotic, no suicidal or hemocidal ideation.  Labs on Admission: I have personally reviewed following  labs and imaging studies  CBC: Recent Labs  Lab 03/11/23 1402  WBC 13.9*  HGB 10.0*  HCT 32.6*  MCV 88.3  PLT 372   Basic Metabolic Panel: Recent Labs  Lab 03/11/23 1402  NA 139  K 3.7  CL 98  CO2 24  GLUCOSE 154*  BUN 18  CREATININE 1.20*  CALCIUM  8.9   GFR: Estimated Creatinine Clearance: 34.4 mL/min (A) (by C-G formula based on SCr of 1.2 mg/dL (H)). Liver Function Tests: No results for input(s): AST, ALT, ALKPHOS, BILITOT, PROT, ALBUMIN  in the last 168 hours. No results for input(s): LIPASE, AMYLASE in the last 168 hours. No results for input(s): AMMONIA in the last 168 hours. Coagulation Profile: No results for input(s): INR, PROTIME in the last 168 hours. Cardiac Enzymes: No results for input(s): CKTOTAL, CKMB, CKMBINDEX, TROPONINI in the last 168 hours. BNP (last 3 results) No results for input(s): PROBNP in the last 8760 hours. HbA1C: No results for input(s): HGBA1C in the last 72 hours. CBG: No results for input(s): GLUCAP in the last 168 hours. Lipid Profile: No results for input(s): CHOL, HDL, LDLCALC, TRIG, CHOLHDL, LDLDIRECT in the last 72 hours. Thyroid  Function Tests: No results for input(s): TSH, T4TOTAL, FREET4, T3FREE, THYROIDAB in the last 72 hours. Anemia Panel: No results for input(s): VITAMINB12, FOLATE, FERRITIN, TIBC, IRON , RETICCTPCT in the last 72 hours. Urine analysis:     Component Value Date/Time   COLORURINE STRAW (A) 03/11/2023 1804   APPEARANCEUR CLEAR (A) 03/11/2023 1804   APPEARANCEUR Clear 10/12/2017 1410   LABSPEC 1.006 03/11/2023 1804   LABSPEC 1.003 11/24/2013 2117   PHURINE 7.0 03/11/2023 1804   GLUCOSEU NEGATIVE 03/11/2023 1804   GLUCOSEU Negative 11/24/2013 2117   HGBUR NEGATIVE 03/11/2023 1804   BILIRUBINUR NEGATIVE 03/11/2023 1804   BILIRUBINUR Negative 10/12/2017 1410   BILIRUBINUR Negative 11/24/2013 2117   KETONESUR NEGATIVE 03/11/2023 1804   PROTEINUR NEGATIVE 03/11/2023 1804   NITRITE NEGATIVE 03/11/2023 1804   LEUKOCYTESUR NEGATIVE 03/11/2023 1804   LEUKOCYTESUR Negative 11/24/2013 2117   Sepsis Labs: @LABRCNTIP (procalcitonin:4,lacticidven:4) ) Recent Results (from the past 240 hours)  Resp panel by RT-PCR (RSV, Flu A&B, Covid) Anterior Nasal Swab     Status: None   Collection Time: 03/11/23  6:42 PM   Specimen: Anterior Nasal Swab  Result Value Ref Range Status   SARS Coronavirus 2 by RT PCR NEGATIVE NEGATIVE Final    Comment: (NOTE) SARS-CoV-2 target nucleic acids are NOT DETECTED.  The SARS-CoV-2 RNA is generally detectable in upper respiratory specimens during the acute phase of infection. The lowest concentration of SARS-CoV-2 viral copies this assay can detect is 138 copies/mL. A negative result does not preclude SARS-Cov-2 infection and should not be used as the sole basis for treatment or other patient management decisions. A negative result may occur with  improper specimen collection/handling, submission of specimen other than nasopharyngeal swab, presence of viral mutation(s) within the areas targeted by this assay, and inadequate number of viral copies(<138 copies/mL). A negative result must be combined with clinical observations, patient history, and epidemiological information. The expected result is Negative.  Fact Sheet for Patients:  bloggercourse.com  Fact Sheet for  Healthcare Providers:  seriousbroker.it  This test is no t yet approved or cleared by the [place]  FDA and  has been authorized for detection and/or diagnosis of SARS-CoV-2 by FDA under an Emergency Use Authorization (EUA). This EUA will remain  in effect (meaning this test can be used) for the duration of the COVID-19  declaration under Section 564(b)(1) of the Act, 21 U.S.C.section 360bbb-3(b)(1), unless the authorization is terminated  or revoked sooner.       Influenza A by PCR NEGATIVE NEGATIVE Final   Influenza B by PCR NEGATIVE NEGATIVE Final    Comment: (NOTE) The Xpert Xpress SARS-CoV-2/FLU/RSV plus assay is intended as an aid in the diagnosis of influenza from Nasopharyngeal swab specimens and should not be used as a sole basis for treatment. Nasal washings and aspirates are unacceptable for Xpert Xpress SARS-CoV-2/FLU/RSV testing.  Fact Sheet for Patients: bloggercourse.com  Fact Sheet for Healthcare Providers: seriousbroker.it  This test is not yet approved or cleared by the [place]  FDA and has been authorized for detection and/or diagnosis of SARS-CoV-2 by FDA under an Emergency Use Authorization (EUA). This EUA will remain in effect (meaning this test can be used) for the duration of the COVID-19 declaration under Section 564(b)(1) of the Act, 21 U.S.C. section 360bbb-3(b)(1), unless the authorization is terminated or revoked.     Resp Syncytial Virus by PCR NEGATIVE NEGATIVE Final    Comment: (NOTE) Fact Sheet for Patients: bloggercourse.com  Fact Sheet for Healthcare Providers: seriousbroker.it  This test is not yet approved or cleared by the [place]  FDA and has been authorized for detection and/or diagnosis of SARS-CoV-2 by FDA under an Emergency Use Authorization (EUA). This EUA will remain in effect (meaning this  test can be used) for the duration of the COVID-19 declaration under Section 564(b)(1) of the Act, 21 U.S.C. section 360bbb-3(b)(1), unless the authorization is terminated or revoked.  Performed at [facility], [address] [place], [place] [address]      Radiological Exams on Admission:   Assessment/Plan Principal Problem:   Cellulitis of lower extremity Active Problems:   Essential hypertension   Chronic diastolic CHF (congestive heart failure) (HCC)   CAD (coronary artery disease)   Atrial fibrillation, chronic (HCC)   COPD (chronic obstructive pulmonary disease) (HCC)   Stage 3b chronic kidney disease (HCC)   Rheumatoid arthritis (HCC)   OSA (obstructive sleep apnea)   Bipolar disorder, current episode mixed, unspecified (HCC)   Depression with anxiety   Obesity (BMI 30-39.9)   Dizziness   Assessment and Plan:   Cellulitis of lower extremity_left leg: Patient has WBC 13.9, but no fever, lactic acid normal 1.8, clinically does not seem to have sepsis.  - will admit to tele bed as inpatient -  Empiric antimicrobial treatment with vancomycin , Rocephin  - PRN Zofran  for nausea, tramadol  and tylenol  for pain - Blood cultures x 2  - ESR and CRP - wound care consult  Essential hypertension -IV hydralazine  as needed Patient is on torsemide , nebivolol , amlodipine   Chronic diastolic CHF (congestive heart failure) (HCC): 2D echo on 03/30/2022 showed EF of 55 to 60% with grade 2 diastolic dysfunction.  Patient has trace leg edema, no SOB.  CHF seem to be compensated.  BNP 277 -Continue home torsemide   CAD (coronary artery disease): No chest pain -Pravastatin   Atrial fibrillation, chronic (HCC): Heart rate up to 160s --> 140 --> 90s currently -Nebivolol  -As needed IV metoprolol  2.5 mg for heart rate > 125  COPD (chronic obstructive pulmonary disease) (HCC): Stable -Bronchodilators as needed Mucinex  -Singulair   Stage 3b chronic kidney disease (HCC):  Stable.  -Follow-up with BMP  Rheumatoid arthritis (HCC) -Continue home leflunomide   OSA (obstructive sleep apnea) -CPAP  Bipolar disorder, current episode mixed, unspecified (HCC) and Depression with anxiety: Patient is calm now. -Continue home medications: Lamictal , Lexapro , BuSpar   Obesity (BMI 30-39.9): Body weight 81 kg, BMI 36.05 -Encourage losing weight -Exercise healthy diet  Dizziness: Likely due to tachycardia.  Almost resolved along with better heart rate control.  CT head negative.  No focal neurodeficit on physical examination. -Fall precaution -PT/OT -Check orthostatic vital sign    DVT ppx: on Eliquis   Code Status: DNR per pt (I discussed with patient and explained the meaning of CODE STATUS. Patient wants to be DNR)  Family Communication:     not done, no family member is at bed side.      \ Disposition Plan:  Anticipate discharge back to previous environment  Consults called:  none  Admission status and Level of care: Telemetry Medical: as inpt        Dispo: The patient is from: Home              Anticipated d/c is to: Home              Anticipated d/c date is: 2 days              Patient currently is not medically stable to d/c.    Severity of Illness:  The appropriate patient status for this patient is INPATIENT. Inpatient status is judged to be reasonable and necessary in order to provide the required intensity of service to ensure the patient's safety. The patient's presenting symptoms, physical exam findings, and initial radiographic and laboratory data in the context of their chronic comorbidities is felt to place them at high risk for further clinical deterioration. Furthermore, it is not anticipated that the patient will be medically stable for discharge from the hospital within 2 midnights of admission.   * I certify that at the point of admission it is my clinical judgment that the patient will require inpatient hospital care spanning beyond  2 midnights from the point of admission due to high intensity of service, high risk for further deterioration and high frequency of surveillance required.*       Date of Service 03/11/2023    Caleb Exon [facility]   If 7PM-7AM, please contact night-coverage [email or web address] 03/11/2023, 10:59 PM

## 2023-03-11 NOTE — Sepsis Progress Note (Signed)
 Elink monitoring for the code sepsis protocol.

## 2023-03-11 NOTE — Telephone Encounter (Signed)
 Received paperwork and handed to Deborra Falter to fill out since she seen pt for follow up.

## 2023-03-11 NOTE — Telephone Encounter (Signed)
 Copied from CRM [id number]. Topic: General - Other >> Mar 11, 2023 10:18 AM Rosina BIRCH wrote: Reason for CRM: Amy from [facility] called stating she sent a message to the provider regarding home health evaluation for nursing but she has not heard anything back. Amy stated the doctor can give a verbal order over the phone  [phone]

## 2023-03-11 NOTE — Progress Notes (Signed)
 Pharmacy Antibiotic Note  Kerri Carter is a 83 y.o. female admitted on 03/11/2023 with cellulitis. Patient presenting with laceration to lower left extremity and reports purulence from wound. In ED, patient is afebrile with WBC 13.9. Pharmacy has been consulted for vancomycin  dosing.  Plan: Vancomycin  load of 1750 mg IV x1 given in ED Start vancomycin  1000 mg IV every 48 hours (eAUC 482.9, Scr 1.2, IBW used, Vd 0.5 L/kg) Monitor renal function, clinical status, culture data, and LOT  Temp (24hrs), Avg:98.4 F (36.9 C), Min:98.1 F (36.7 C), Max:98.8 F (37.1 C)  Recent Labs  Lab 03/11/23 1402 03/11/23 1842  WBC 13.9*  --   CREATININE 1.20*  --   LATICACIDVEN  --  1.8    Estimated Creatinine Clearance: 34.4 mL/min (A) (by C-G formula based on SCr of 1.2 mg/dL (H)).    Allergies  Allergen Reactions   Ceftin [Cefuroxime Axetil] Anaphylaxis   Lisinopril Anaphylaxis   Sulfa Antibiotics Other (See Comments)    Face swelling   Sulfasalazine Anaphylaxis   Morphine  Other (See Comments)    Per patient, low blood pressure issues that requires action to raise it back up. Can take small infrequent doses   Xarelto [Rivaroxaban] Other (See Comments)    Stomach burning, bleeding, and tar in stool   Adhesive [Tape] Rash    Paper tape and tega derm OK   Antihistamines, Chlorpheniramine-Type Other (See Comments)    Makes pt hyper   Antivert [Meclizine Hcl] Other (See Comments)    Bladder will not empty   Aspirin  Other (See Comments)    Sulfasalazine allergy cross reacts   Contrast Media [Iodinated Contrast Media] Rash    she is able to use betadine scrubs.   Decongestant [Pseudoephedrine Hcl] Other (See Comments)    Makes pt hyper   Doxycycline Other (See Comments)    GI upset   Levaquin  [Levofloxacin  In D5w] Rash   Polymyxin B  Other (See Comments)    Facial rash   Tetanus Toxoids Rash and Other (See Comments)    Fever and hot to touch at injection site   Antimicrobials this  admission: vancomycin  2/6 >>  ceftriaxone  2/6 >>   Dose adjustments this admission: N/A  Microbiology results: 2/6 BCx: pending  Thank you for involving pharmacy in this patient's care.   Damien Napoleon, PharmD Clinical Pharmacist 03/11/2023 9:08 PM

## 2023-03-11 NOTE — ED Notes (Signed)
 First Nurse Note: Pt to ED via [facility] from her PCP office for dizziness when standing. Pt is in NAD at this time. Pt has hx/o recent falls.

## 2023-03-11 NOTE — Progress Notes (Signed)
 CODE SEPSIS - PHARMACY COMMUNICATION  **Broad Spectrum Antibiotics should be administered within 1 hour of Sepsis diagnosis**  Time Code Sepsis Called/Page Received: 1901  Antibiotics Ordered: vancomycin  and ceftriaxone   Time of 1st antibiotic administration: 1904  Additional action taken by pharmacy: None  If necessary, Name of Provider/Nurse Contacted: None   Damien Napoleon ,PharmD Clinical Pharmacist  03/11/2023  7:02 PM

## 2023-03-11 NOTE — ED Provider Notes (Signed)
 [facility] Provider Note    Event Date/Time   First MD Initiated Contact with Patient 03/11/23 1745     (approximate)   History   Dizziness   HPI  Kerri Carter is a 83 y.o. female who presents to the ED for evaluation of Dizziness   Review of PCP visit from earlier today.  Seen for dressing change to left lower extremity after an accidental laceration that occurred on 1/22.  She was seen here in our ED and required multiple sutures, started on clindamycin  at that point.  Patient presents to the ED at the direction of her PCP due to dizziness with ambulation and abnormal vital signs at her PCP visit today.  Reportedly tachycardic to the 140s with presyncopal dizziness with ambulation.  Reports redness to her wound despite clindamycin  antibiotics.  Reportedly purulence able to be expressed from this wound.   Physical Exam   Triage Vital Signs: ED Triage Vitals  Encounter Vitals Group     BP 03/11/23 1446 (!) 148/80     Systolic BP Percentile --      Diastolic BP Percentile --      Pulse Rate 03/11/23 1446 (!) 109     Resp 03/11/23 1446 18     Temp 03/11/23 1446 98.8 F (37.1 C)     Temp Source 03/11/23 1446 Oral     SpO2 03/11/23 1446 98 %     Weight --      Height --      Head Circumference --      Peak Flow --      Pain Score 03/11/23 1449 5     Pain Loc --      Pain Education --      Exclude from Growth Chart --     Most recent vital signs: Vitals:   03/11/23 1805 03/11/23 1830  BP: (!) 176/82 (!) 180/91  Pulse: (!) 109 99  Resp: (!) 24 20  Temp:    SpO2: 99% 99%    General: Awake, no distress.  CV:  Good peripheral perfusion.  Resp:  Normal effort.  Abd:  No distention.  MSK:  No deformity noted.  Neuro:  No focal deficits appreciated. Other:  As pictured below, sutures are intact but erythema spreading around this wound.  mL to express a tiny amount of purulence from the superior portion    ED Results / Procedures /  Treatments   Labs (all labs ordered are listed, but only abnormal results are displayed) Labs Reviewed  BASIC METABOLIC PANEL - Abnormal; Notable for the following components:      Result Value   Glucose, Bld 154 (*)    Creatinine, Ser 1.20 (*)    GFR, Estimated 46 (*)    Anion gap 17 (*)    All other components within normal limits  CBC - Abnormal; Notable for the following components:   WBC 13.9 (*)    RBC 3.69 (*)    Hemoglobin 10.0 (*)    HCT 32.6 (*)    RDW 15.8 (*)    nRBC 0.4 (*)    All other components within normal limits  URINALYSIS, ROUTINE W REFLEX MICROSCOPIC - Abnormal; Notable for the following components:   Color, Urine STRAW (*)    APPearance CLEAR (*)    All other components within normal limits  RESP PANEL BY RT-PCR (RSV, FLU A&B, COVID)  [id number]  CULTURE, BLOOD (ROUTINE X 2)  CULTURE, BLOOD (ROUTINE X 2)  LACTIC ACID, PLASMA  LACTIC ACID, PLASMA  PROCALCITONIN    EKG Sinus tachycardia with a rate of 105 bpm.  Normal axis and intervals.  No clear signs of acute ischemia.  RADIOLOGY CT head interpreted by me without evidence of acute intracranial pathology  Official radiology report(s): CT Head Wo Contrast Result Date: 03/11/2023 CLINICAL DATA:  Dizziness EXAM: CT HEAD WITHOUT CONTRAST TECHNIQUE: Contiguous axial images were obtained from the base of the skull through the vertex without intravenous contrast. RADIATION DOSE REDUCTION: This exam was performed according to the departmental dose-optimization program which includes automated exposure control, adjustment of the mA and/or kV according to patient size and/or use of iterative reconstruction technique. COMPARISON:  02/22/2023 CT head FINDINGS: Brain: No evidence of acute infarction, hemorrhage, mass, mass effect, or midline shift. No hydrocephalus or extra-axial fluid collection. Age related cerebral atrophy. Periventricular white matter changes, likely the sequela of chronic small vessel ischemic  disease. Ex vacuo dilatation the ventricles. Vascular: No hyperdense vessel. Skull: Negative for fracture or focal lesion. Sinuses/Orbits: Mucosal thickening in the left-greater-than-right mastoid air cells. Status post bilateral lens replacements. Other: The mastoid air cells are well aerated. IMPRESSION: No acute intracranial process. Electronically Signed   By: Donald Campion M.D.   On: 03/11/2023 15:36    PROCEDURES and INTERVENTIONS:  .1-3 Lead EKG Interpretation  Performed by: Claudene Rover, MD Authorized by: Claudene Rover, MD     Interpretation: abnormal     ECG rate:  110   ECG rate assessment: tachycardic     Rhythm: sinus tachycardia     Ectopy: none     Conduction: normal   .Critical Care  Performed by: Claudene Rover, MD Authorized by: Claudene Rover, MD   Critical care provider statement:    Critical care time (minutes):  30   Critical care time was exclusive of:  Separately billable procedures and treating other patients   Critical care was necessary to treat or prevent imminent or life-threatening deterioration of the following conditions:  Sepsis   Critical care was time spent personally by me on the following activities:  Development of treatment plan with patient or surrogate, discussions with consultants, evaluation of patient's response to treatment, examination of patient, ordering and review of laboratory studies, ordering and review of radiographic studies, ordering and performing treatments and interventions, pulse oximetry, re-evaluation of patient's condition and review of old charts   Medications  cefTRIAXone  (ROCEPHIN ) 2 g in sodium chloride  0.9 % 100 mL IVPB (2 g Intravenous New Bag/Given 03/11/23 1904)  vancomycin  (VANCOREADY) IVPB 1750 mg/350 mL (has no administration in time range)  lactated ringers  infusion (has no administration in time range)  lactated ringers  bolus 1,000 mL (has no administration in time range)  acetaminophen  (TYLENOL ) tablet 1,000 mg (1,000  mg Oral Given 03/11/23 1839)     IMPRESSION / MDM / ASSESSMENT AND PLAN / ED COURSE  I reviewed the triage vital signs and the nursing notes.  Differential diagnosis includes, but is not limited to, cardiac dysrhythmia, sepsis, cellulitis, abscess, blood loss anemia  {Patient presents with symptoms of an acute illness or injury that is potentially life-threatening.  Patient presents with positional dizziness and tachycardia concerning for sepsis in the setting of erythematous wound to her lower extremity with signs of cellulitis and abscess, requiring medical admission.  Tachycardic but stable, chronic oxygen  use.  Renal dysfunction around baseline, clear urine and leukocytosis are noted.  CT head reassuring as well as EKG.  Strong suspicion for continued infectious features  of her wound concerning for cellulitis, abscess and sepsis.  We will draw cultures, start antibiotics and IV and consult medicine for admission.      FINAL CLINICAL IMPRESSION(S) / ED DIAGNOSES   Final diagnoses:  Dizziness  Cellulitis of left lower extremity     Rx / DC Orders   ED Discharge Orders     None        Note:  This document was prepared using Dragon voice recognition software and may include unintentional dictation errors.   Claudene Rover, MD 03/11/23 [phone]

## 2023-03-11 NOTE — Progress Notes (Signed)
 Established Patient Office Visit  Subjective:  Patient ID: Kerri Carter, female    DOB: 1940-09-27  Age: 83 y.o. MRN: [id number]  CC:  Chief Complaint  Patient presents with   Acute Visit    Left leg wound care    HPI  Kerri Carter presents for dressing change. Has completed clindamycin  for 7 days. She is scheduled to see Dr.Poggi on  03/14/22.   HPI   Past Medical History:  Diagnosis Date   Acromial process of scapula fracture 12/21/2019   Acute kidney injury superimposed on chronic kidney disease (HCC) 05/26/2021   Acute on chronic combined systolic (congestive) and diastolic (congestive) heart failure (HCC) 07/28/2021   Acute on chronic combined systolic and diastolic CHF (congestive heart failure) (HCC) 05/26/2021   Acute on chronic respiratory failure with hypoxia (HCC) 09/10/2021   Acute pain of left knee 12/18/2020   Anemia in stage 3a chronic kidney disease (HCC) 10/09/2021   Anxiety    Anxiety 01/20/2019   Anxiety and depression 05/26/2021   Aortic stenosis 07/09/2015   a.) TTE 07/06/2015: EF 55-60%; mild AS with MPG 13.0 mmHg.   Arrhythmia    atrial fibrillation   Assistance needed for mobility 12/18/2020   Asthma    At high risk for falls 03/16/2019   Avascular necrosis of left shoulder due to adverse effect of steroid therapy (HCC) 01/20/2018   Bipolar disorder (HCC)    C. difficile diarrhea 2010   a.) following ABX course during hospital admission   Carotid atherosclerosis, bilateral    a.) Moderate; < 50% stenosis BILATERAL ICAs.   Cataract    a.) s/p BILATERAL extraction   CHF (congestive heart failure) (HCC)    Chicken pox    Chronic left shoulder pain 08/29/2019   Chronic respiratory failure with hypoxia (HCC) 10/01/2021   CKD (chronic kidney disease), stage III (HCC)    a. s/p R nephrectomy./ aneurysm   Closed fracture of acromial process of scapula 10/28/2018   Closed nondisplaced fracture of proximal phalanx of lesser toe of right foot     Collapsed vertebra, not elsewhere classified, lumbar region, initial encounter for fracture (HCC) 02/21/2019   Community acquired pneumonia 07/22/2021   Complicated grief 03/16/2019   Compression fracture of L4 vertebra (HCC) 02/21/2019   Conversion disorder    Conversion disorder 08/04/2015   COPD (chronic obstructive pulmonary disease) (HCC)    COPD with exacerbation (HCC) 08/20/2021   Cough 05/02/2012   Depression    Dislocation of hip joint prosthesis (HCC) 01/08/2017   Elevated brain natriuretic peptide (BNP) level 12/18/2020   Elevated d-dimer 05/26/2021   Episodic mood disorder (HCC) 04/18/2019   Essential hypertension    Fatigue 07/07/2018   Frequent falls 12/18/2020   GERD (gastroesophageal reflux disease)    H/O cardiac catheterization 02/24/2011   Formatting of this note might be different from the original.  Repeated at [facility] 2/13 and insignificant disease   Hav (hallux abducto valgus), unspecified laterality 12/05/2018   Heart murmur    Hip fracture (HCC) 10/31/2016   Hip pain 12/18/2020   History of fracture of left hip 01/15/2017   History of syncope 08/25/2016   Hyperkalemia 05/26/2021   - The patient was given p.o. Lokelma  and IV Lasix .  - We will follow potassium level.   Hyperlipidemia    Hypokalemia 08/25/2016   ILD (interstitial lung disease) (HCC)    mild; 2/2 RA diagnosis   Iliopsoas bursitis of right hip 08/29/2019   Inflammatory arthritis  a. hands/carpal tunnel.  b. Low titer rheumatoid factor. c. Negative anti-CCP antibodies. d. Plaquenil.   Insomnia 07/17/2015   Intractable nausea and vomiting 08/10/2022   Left foot pain 10/19/2016   Left lower lobe pneumonia 05/26/2021   Leg edema 05/02/2019   Leg pain, bilateral    Leg swelling 12/15/2016   Leukocytosis 10/19/2017   Leukopenia 06/24/2021   Lymphocytosis 07/13/2021   Macular degeneration    Microcytic anemia 08/15/2015   Mixed bipolar I disorder (HCC) 10/09/2015   Nocturnal  hypoxemia    Nocturnal hypoxemia 09/09/2011   June 2013 overnight oximetry on 6 centimeters of water  CPAP: SpO2 less than 88% 72% of the time   Non-Obstructive CAD    a. 07/2009 Cath ([facility]): nonobs dzs;  b. 03/2011 Cath [facility]): nonobs dzs.   NSIP (nonspecific interstitial pneumonitis) (HCC) 12/08/2019   Osteoarthritis    a. Knees.   Other shoulder lesions, left shoulder 12/20/2017   Other specified postprocedural states 07/20/2013   Overweight (BMI 25.0-29.9) 08/29/2019   PAD (peripheral artery disease) (HCC)    Pain due to onychomycosis of toenails of both feet 12/05/2018   Pelvic mass in female 03/16/2019   02/21/19 CT pelvis w/o contrast  Cystic left adnexal lesion has enlarged since 2018 where it  measured approximately 1.8 x 1.7 cm. This is not well assessed due  to streak artifact as well as lack of contrast. Ultrasound follow-up  may be helpful in 8-12 weeks      04/05/19 TVUS     IMPRESSION:  Simple appearing cystic structure within the left ovary measuring up  to 2.9 cm. While this has enlarged s   Physical deconditioning 02/27/2021   Pleural effusion on left 12/18/2020   PUD (peptic ulcer disease)    Recurrent falls 09/01/2021   Recurrent pneumonia 07/23/2021   Repeated falls 12/18/2020   Respiratory failure with hypoxia (HCC) 01/12/2021   Rotator cuff tendinitis, right 07/26/2017   S/P lumbar fusion 05/23/2021   S/P right hip fracture    11/01/16 s/p repair   Sepsis due to pneumonia (HCC) 07/22/2021   Shoulder pain    Sleep apnea    no cpap / minimal   SOB (shortness of breath) 10/13/2011   2011 [facility] Pulmonary eval >> LHC (non-obstructive CAD), RHC (wedge 13, mean PA 26), TTE (LVEF > 55%, mild concentric LVH, mild AR, MR, PR, TR), CTAngio chest unrevealing; well controlled asthma  02/2011 TTE [facility]>> NL LVEF, Mild LVH, Mod AR/Mod MR   Spinal stenosis at L4-L5 level    severe with L4/L5 anterolisthesis grade 1 anterolisthesis    Spinal stenosis of lumbar region  09/15/2016   Last Assessment & Plan:   Now post op with continued back and leg pain noted. Is working with pt/ot and surgery was said to have no complications. Bowels are moving and general pain and anxiety is basically baseline.    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   Weakness of both lower  extremities 12/18/2020    Past Surgical History:  Procedure Laterality Date   APPENDECTOMY     APPLICATION OF INTRAOPERATIVE CT SCAN N/A 05/23/2021   Procedure: APPLICATION OF INTRAOPERATIVE CT SCAN;  Surgeon: Clois Fret, MD;  Location: [facility] ORS;  Service: Neurosurgery;  Laterality: N/A;   BACK SURGERY     lumbar fusion   BUNIONECTOMY Right    CATARACT EXTRACTION, BILATERAL     CESAREAN SECTION     x1   CHOLECYSTECTOMY N/A 05/11/2016   Procedure: LAPAROSCOPIC CHOLECYSTECTOMY;  Surgeon: Charlie FORBES Fell, MD;  Location: [facility] OR[facility];  Service: General;  Laterality: N/A;   COLONOSCOPY WITH PROPOFOL  N/A 04/02/2016   Procedure: COLONOSCOPY WITH PROPOFOL ;  Surgeon: Ruel Kung, MD;  Location: [facility] END[facility];  Service: Endoscopy;  Laterality: N/A;   ENDOSCOPIC RETROGRADE CHOLANGIOPANCREATOGRAPHY (ERCP) WITH PROPOFOL  N/A 05/08/2016   Procedure: ENDOSCOPIC RETROGRADE CHOLANGIOPANCREATOGRAPHY (ERCP) WITH PROPOFOL ;  Surgeon: Rogelia Copping, MD;  Location: [facility];  Service: Endoscopy;  Laterality: N/A;   ERCP     with biliary spincterotomy 05/08/16 Dr. Copping for choledocholithiasis     ESOPHAGEAL DILATION  04/02/2016   Procedure: ESOPHAGEAL DILATION;  Surgeon: Ruel Kung, MD;  Location: [facility];  Service: Endoscopy;;   ESOPHAGOGASTRODUODENOSCOPY (EGD) WITH PROPOFOL  N/A 04/02/2016   Procedure: ESOPHAGOGASTRODUODENOSCOPY (EGD) WITH PROPOFOL ;  Surgeon: Ruel Kung, MD;  Location: [facility];  Service: Endoscopy;  Laterality: N/A;   HIP ARTHROPLASTY Right 11/01/2016   Procedure: ARTHROPLASTY BIPOLAR HIP (HEMIARTHROPLASTY);  Surgeon: Edie Norleen PARAS, MD;  Location: [facility];  Service: Orthopedics;  Laterality: Right;   LUMBAR LAMINECTOMY/DECOMPRESSION MICRODISCECTOMY N/A 12/11/2020   Procedure: LEFT L2-3 MICRODISCECTOMY, L3-4 AND L5-S1 DECOMPRESSION;  Surgeon: Clois Fret, MD;  Location: [facility];  Service: Neurosurgery;  Laterality: N/A;   NEPHRECTOMY  1988   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   ORIF SCAPHOID FRACTURE Left 12/21/2019   Procedure: OPEN REDUCTION INTERNAL FIXATION (ORIF) OF LEFT SCAPULAR NONUNION WITH BONE GRAFT;  Surgeon: Edie Norleen PARAS, MD;  Location: [facility];  Service: Orthopedics;  Laterality: Left;   osteoporosis     noted DEXA 08/19/16    REPLACEMENT TOTAL KNEE Right    REVERSE SHOULDER ARTHROPLASTY Right 11/04/2017   Procedure: REVERSE SHOULDER ARTHROPLASTY;  Surgeon: Edie Norleen PARAS, MD;  Location: [facility];  Service: Orthopedics;  Laterality: Right;   REVERSE SHOULDER ARTHROPLASTY Left 07/26/2018   Procedure: REVERSE SHOULDER ARTHROPLASTY;  Surgeon: Edie Norleen PARAS, MD;  Location: [facility];  Service: Orthopedics;  Laterality: Left;   TONSILLECTOMY     TOTAL HIP ARTHROPLASTY  12/10/11   [facility] left hip   TOTAL HIP ARTHROPLASTY Bilateral    TRANSFORAMINAL LUMBAR INTERBODY FUSION (TLIF) WITH PEDICLE SCREW FIXATION 3 LEVEL N/A 05/23/2021   Procedure: OPEN L2-4 TRANSFORAMINAL LUMBAR INTERBODY FUSION (TLIF) WITH L2-5 POSTERIOR SPINAL FUSION;  Surgeon: Clois Fret, MD;  Location: [facility];  Service: Neurosurgery;  Laterality:  N/A;   TUBAL LIGATION      Family History  Problem Relation Age of Onset   Rheum arthritis Mother    Asthma Mother    Parkinson's disease Mother    Heart disease Mother    Stroke Mother    Hypertension Mother    Heart attack Father    Heart disease Father    Hypertension Father    Peripheral Artery Disease Father    Diabetes Son    Gout Son    Asthma Sister    Heart disease Sister    Lung cancer Sister  Heart disease Sister    Heart disease Sister    Breast cancer Sister    Heart attack Sister    Heart disease Brother    Heart disease Maternal Grandmother    Diabetes Maternal Grandmother    Colon cancer Maternal Grandmother    Cancer Maternal Grandmother        Hodgkins lymphoma   Heart disease Brother    Alcohol  abuse Brother    Depression Brother    Dementia Son     Social History   Socioeconomic History   Marital status: Widowed    Spouse name: richard   Number of children: 2   Years of education: Some Coll   Highest education level: Some college, no degree  Occupational History   Occupation: Retired    Comment: retired  Tobacco Use   Smoking status: Former    Current packs/day: 0.00    Average packs/day: 0.5 packs/day for 20.0 years (10.0 ttl pk-yrs)    Types: Cigarettes    Start date: 02/02/1954    Quit date: 02/02/1974    Years since quitting: 49.1   Smokeless tobacco: Never  Vaping Use   Vaping status: Never Used  Substance and Sexual Activity   Alcohol  use: No   Drug use: No   Sexual activity: Not Currently  Other Topics Concern   Not on file  Social History Narrative   Lives at home in [place] grandson, his wife and their child.   Husband Laticha Ferrucci died covid 22 late 1/2071married x 51 years   Right-handed.   6 cups coffee per day.   Social Drivers of Corporate Investment Banker Strain: Low Risk  (09/22/2022)   Overall Financial Resource Strain (CARDIA)    Difficulty of Paying Living Expenses: Not hard at all  Food Insecurity:  No Food Insecurity (03/12/2023)   Hunger Vital Sign    Worried About Running Out of Food in the Last Year: Never true    Ran Out of Food in the Last Year: Never true  Transportation Needs: No Transportation Needs (03/12/2023)   PRAPARE - Administrator, Civil Service (Medical): No    Lack of Transportation (Non-Medical): No  Physical Activity: Inactive (09/22/2022)   Exercise Vital Sign    Days of Exercise per Week: 0 days    Minutes of Exercise per Session: 0 min  Stress: No Stress Concern Present (09/22/2022)   [employer] of Occupational Health - Occupational Stress Questionnaire    Feeling of Stress : Only a little  Social Connections: Socially Isolated (03/12/2023)   Social Connection and Isolation Panel [NHANES]    Frequency of Communication with Friends and Family: More than three times a week    Frequency of Social Gatherings with Friends and Family: More than three times a week    Attends Religious Services: Never    Database Administrator or Organizations: No    Attends Banker Meetings: Never    Marital Status: Widowed  Intimate Partner Violence: Not At Risk (03/12/2023)   Humiliation, Afraid, Rape, and Kick questionnaire    Fear of Current or Ex-Partner: No    Emotionally Abused: No    Physically Abused: No    Sexually Abused: No     Outpatient Medications Prior to Visit  Medication Sig Dispense Refill   albuterol  (PROVENTIL ) (2.5 MG/3ML) 0.083% nebulizer solution Take 3 mLs (2.5 mg total) by nebulization every 6 (six) hours as needed for wheezing or shortness of breath. 75  mL 12   albuterol  (VENTOLIN  HFA) 108 (90 Base) MCG/ACT inhaler Inhale 2 puffs into the lungs every 6 (six) hours as needed for wheezing or shortness of breath. 18 g 11   amLODipine  (NORVASC ) 5 MG tablet Take 1 tablet (5 mg total) by mouth daily. (Patient taking differently: Take 5 mg by mouth in the morning and at bedtime.) 90 tablet 3   apixaban  (ELIQUIS ) 5 MG TABS tablet Take  5 mg by mouth 2 (two) times daily.     busPIRone  (BUSPAR ) 5 MG tablet Take 1 tablet (5 mg total) by mouth 2 (two) times daily.     cetirizine  (ZYRTEC ) 10 MG tablet TAKE 1 TABLET BY MOUTH EVERYDAY AT BEDTIME 90 tablet 3   dicyclomine  (BENTYL ) 10 MG capsule TAKE 1 CAPSULE (10 MG TOTAL) BY MOUTH 2 (TWO) TIMES DAILY AS NEEDED FOR SPASMS. 180 capsule 2   ENBREL SURECLICK 50 MG/ML injection Inject 50 mg into the skin once a week. Takes on Fridays     escitalopram  (LEXAPRO ) 10 MG tablet Take 1 tablet (10 mg total) by mouth daily. 90 tablet 3   famotidine  (PEPCID ) 20 MG tablet TAKE 1 TABLET BY MOUTH EVERYDAY AT BEDTIME 90 tablet 3   Fluticasone -Umeclidin-Vilant (TRELEGY ELLIPTA ) 100-62.5-25 MCG/ACT AEPB Inhale 1 puff into the lungs daily. 3 each 3   gabapentin  (NEURONTIN ) 300 MG capsule Take 300 mg in the morning and 600 mg at night. 90 capsule 1   GEMTESA  75 MG TABS TAKE 75 MG BY MOUTH DAILY. 90 tablet 3   lamoTRIgine  (LAMICTAL ) 100 MG tablet TAKE 1 TAB 2 TIMES DAILY. FURTHER REFILLS NEW PSYCH FOR ALL PSYCH MEDS ONLY TEMP SUPPLY FROM PCP 180 tablet 1   leflunomide  (ARAVA ) 20 MG tablet Take 1 tablet (20 mg total) by mouth daily.     lidocaine  (LIDODERM ) 5 % Place 1 patch onto the skin 2 (two) times daily as needed. Remove & Discard patch within 12 hours or as directed by MD 120 patch 2   lovastatin  (MEVACOR ) 20 MG tablet Take 1 tablet (20 mg total) by mouth daily at 6 PM. 90 tablet 3   methocarbamol  (ROBAXIN ) 500 MG tablet Take 500 mg by mouth 3 (three) times daily.     mometasone  (NASONEX ) 50 MCG/ACT nasal spray USE 1 SPRAY TO EACH NOSTRIL TWICE A DAY 51 each 1   montelukast  (SINGULAIR ) 10 MG tablet TAKE 1 TABLET BY MOUTH EVERY DAY 90 tablet 3   multivitamin-lutein  (OCUVITE-LUTEIN ) CAPS capsule Take 1 capsule by mouth at bedtime.     nebivolol  (BYSTOLIC ) 5 MG tablet Take 1 tablet (5 mg total) by mouth in the morning and at bedtime. (Note dose changed from 1/2 10 mg bid to 5 mg bid) 180 tablet 3    OHTUVAYRE  3 MG/2.5ML SUSP Inhale 1 ampule into the lungs in the morning and at bedtime.     ondansetron  (ZOFRAN ) 4 MG tablet Take 1 tablet (4 mg total) by mouth every 6 (six) hours as needed for nausea. 20 tablet 0   OXYGEN  Inhale 3 L into the lungs continuous.     pantoprazole  (PROTONIX ) 40 MG tablet TAKE 1 TABLET BY MOUTH 2 TIMES DAILY 30 MIN BEFORE FOOD (NOTE REDUCTION IN FREQUENCY) 180 tablet 1   predniSONE  (DELTASONE ) 5 MG tablet TAKE 1 TABLET BY MOUTH EVERY DAY WITH BREAKFAST 30 tablet 11   PROLIA  60 MG/ML SOSY injection Inject 60 mg into the skin every 6 (six) months.     QUEtiapine  (SEROQUEL ) 50 MG  tablet Take 50 mg by mouth at bedtime.     sucralfate  (CARAFATE ) 1 g tablet TAKE 1 TABLET BY MOUTH TWICE A DAY 180 tablet 2   traMADol  (ULTRAM ) 50 MG tablet Take 1 tablet (50 mg total) by mouth every 12 (twelve) hours as needed. 60 tablet 1   QUEtiapine  (SEROQUEL ) 25 MG tablet TAKE 1 TABLET (25 MG TOTAL) BY MOUTH AT BEDTIME. AGAIN LAST FILL FURTHER REFILLS FROM PSYCHIATRY NO EXCEPTIONS 90 tablet 0   torsemide  (DEMADEX ) 20 MG tablet Take 2 tablets (40 mg total) by mouth daily. 60 tablet 0   No facility-administered medications prior to visit.    Allergies  Allergen Reactions   Ceftin [Cefuroxime Axetil] Anaphylaxis   Lisinopril Anaphylaxis   Sulfa Antibiotics Other (See Comments)    Face swelling   Sulfasalazine Anaphylaxis   Morphine  Other (See Comments)    Per patient, low blood pressure issues that requires action to raise it back up. Can take small infrequent doses   Xarelto [Rivaroxaban] Other (See Comments)    Stomach burning, bleeding, and tar in stool   Adhesive [Tape] Rash    Paper tape and tega derm OK   Antihistamines, Chlorpheniramine-Type Other (See Comments)    Makes pt hyper   Antivert [Meclizine Hcl] Other (See Comments)    Bladder will not empty   Aspirin  Other (See Comments)    Sulfasalazine allergy cross reacts   Contrast Media [Iodinated Contrast Media] Rash     she is able to use betadine scrubs.   Decongestant [Pseudoephedrine Hcl] Other (See Comments)    Makes pt hyper   Doxycycline Other (See Comments)    GI upset   Levaquin  [Levofloxacin  In D5w] Rash   Polymyxin B  Other (See Comments)    Facial rash   Tetanus Toxoids Rash and Other (See Comments)    Fever and hot to touch at injection site    ROS Review of Systems Negative unless indicated in HPI.    Objective:    Physical Exam  BP 138/84 (Cuff Size: Normal)   Pulse (!) 101   Temp 98.1 F (36.7 C) (Oral)   Resp 18   Ht 4' 11 (1.499 m)   Wt 178 lb 8 oz (81 kg)   SpO2 96%   BMI 36.05 kg/m  Wt Readings from Last 3 Encounters:  03/13/23 175 lb 11.3 oz (79.7 kg)  03/11/23 178 lb 8 oz (81 kg)  03/05/23 181 lb 12.8 oz (82.5 kg)     Health Maintenance  Topic Date Due   Medicare Annual Wellness (AWV)  09/22/2023   Pneumonia Vaccine 22+ Years old  Completed   INFLUENZA VACCINE  Completed   DEXA SCAN  Completed   HPV VACCINES  Aged Out   DTaP/Tdap/Td  Discontinued   Colonoscopy  Discontinued   COVID-19 Vaccine  Discontinued   Zoster Vaccines- Shingrix  Discontinued    There are no preventive care reminders to display for this patient.  Lab Results  Component Value Date   TSH 2.53 07/03/2021   Lab Results  Component Value Date   WBC 11.1 (H) 03/13/2023   HGB 9.6 (L) 03/13/2023   HCT 31.0 (L) 03/13/2023   MCV 86.8 03/13/2023   PLT 328 03/13/2023   Lab Results  Component Value Date   NA 138 03/13/2023   K 3.0 (L) 03/13/2023   CO2 30 03/13/2023   GLUCOSE 138 (H) 03/13/2023   BUN 14 03/13/2023   CREATININE 1.18 (H) 03/13/2023   BILITOT 0.6  02/11/2023   ALKPHOS 82 02/11/2023   AST 20 02/11/2023   ALT 17 02/11/2023   PROT 7.4 02/11/2023   ALBUMIN  3.4 (L) 02/11/2023   CALCIUM  8.3 (L) 03/13/2023   ANIONGAP 13 03/13/2023   GFR 35.66 (L) 07/03/2021   Lab Results  Component Value Date   CHOL 176 12/01/2019   Lab Results  Component Value Date   HDL  53.60 12/01/2019   Lab Results  Component Value Date   LDLCALC 60 12/01/2018   Lab Results  Component Value Date   TRIG 360.0 (H) 12/01/2019   Lab Results  Component Value Date   CHOLHDL 3 12/01/2019   Lab Results  Component Value Date   HGBA1C 6.2 (H) 07/17/2022      Assessment & Plan:  SOB (shortness of breath) on exertion Assessment & Plan: Patient tachycardic experiencing shortness of breath on 3 L of oxygen  and dizziness with ambulation. Given patient symptoms in the setting of recent left leg wound would need further evaluation to rule out sepsis.  EKG sinus tachycardia with nonspecific ST changes. Patient transported to ER via EMS for further evaluation.  Orders: -     EKG 12-Lead  Laceration of left lower extremity, sequela Assessment & Plan: She has completed clindamycin  for 7 days.  Serous discharge.  Erythema surrounding the wound.  Questionable cellulitis.     Other orders -     EKG    Follow-up: No follow-ups on file.   Alexia Dinger, NP

## 2023-03-12 ENCOUNTER — Encounter: Payer: Self-pay | Admitting: Internal Medicine

## 2023-03-12 ENCOUNTER — Telehealth: Payer: Self-pay

## 2023-03-12 DIAGNOSIS — M069 Rheumatoid arthritis, unspecified: Secondary | ICD-10-CM

## 2023-03-12 DIAGNOSIS — L03116 Cellulitis of left lower limb: Secondary | ICD-10-CM | POA: Diagnosis not present

## 2023-03-12 DIAGNOSIS — I10 Essential (primary) hypertension: Secondary | ICD-10-CM | POA: Diagnosis not present

## 2023-03-12 DIAGNOSIS — J449 Chronic obstructive pulmonary disease, unspecified: Secondary | ICD-10-CM

## 2023-03-12 DIAGNOSIS — I5032 Chronic diastolic (congestive) heart failure: Secondary | ICD-10-CM | POA: Diagnosis not present

## 2023-03-12 DIAGNOSIS — F3160 Bipolar disorder, current episode mixed, unspecified: Secondary | ICD-10-CM

## 2023-03-12 DIAGNOSIS — I482 Chronic atrial fibrillation, unspecified: Secondary | ICD-10-CM | POA: Diagnosis not present

## 2023-03-12 LAB — CBC
HCT: 32.2 % — ABNORMAL LOW (ref 36.0–46.0)
Hemoglobin: 10.3 g/dL — ABNORMAL LOW (ref 12.0–15.0)
MCH: 27.5 pg (ref 26.0–34.0)
MCHC: 32 g/dL (ref 30.0–36.0)
MCV: 86.1 fL (ref 80.0–100.0)
Platelets: 372 10*3/uL (ref 150–400)
RBC: 3.74 MIL/uL — ABNORMAL LOW (ref 3.87–5.11)
RDW: 15.7 % — ABNORMAL HIGH (ref 11.5–15.5)
WBC: 13.1 10*3/uL — ABNORMAL HIGH (ref 4.0–10.5)
nRBC: 0.4 % — ABNORMAL HIGH (ref 0.0–0.2)

## 2023-03-12 LAB — BASIC METABOLIC PANEL
Anion gap: 15 (ref 5–15)
BUN: 13 mg/dL (ref 8–23)
CO2: 28 mmol/L (ref 22–32)
Calcium: 8.6 mg/dL — ABNORMAL LOW (ref 8.9–10.3)
Chloride: 94 mmol/L — ABNORMAL LOW (ref 98–111)
Creatinine, Ser: 1.1 mg/dL — ABNORMAL HIGH (ref 0.44–1.00)
GFR, Estimated: 51 mL/min — ABNORMAL LOW (ref >60)
Glucose, Bld: 191 mg/dL — ABNORMAL HIGH (ref 70–99)
Potassium: 3.2 mmol/L — ABNORMAL LOW (ref 3.5–5.1)
Sodium: 137 mmol/L (ref 135–145)

## 2023-03-12 LAB — SEDIMENTATION RATE: Sed Rate: 53 mm/h — ABNORMAL HIGH (ref 0–30)

## 2023-03-12 MED ORDER — GABAPENTIN 300 MG PO CAPS: 300.0000 mg | ORAL_CAPSULE | Freq: Two times a day (BID) | ORAL | Status: DC

## 2023-03-12 MED ORDER — QUETIAPINE FUMARATE 25 MG PO TABS
50.0000 mg | ORAL_TABLET | Freq: Every day | ORAL | Status: DC
  Administered 2023-03-12: 50 mg via ORAL
  Filled 2023-03-12: qty 2

## 2023-03-12 MED ORDER — BUSPIRONE HCL 10 MG PO TABS
5.0000 mg | ORAL_TABLET | Freq: Two times a day (BID) | ORAL | Status: DC
  Administered 2023-03-12 – 2023-03-13 (×3): 5 mg via ORAL
  Filled 2023-03-12 (×2): qty 1
  Filled 2023-03-12: qty 0.5

## 2023-03-12 MED ORDER — TRAMADOL HCL 50 MG PO TABS
50.0000 mg | ORAL_TABLET | Freq: Two times a day (BID) | ORAL | Status: DC | PRN
  Administered 2023-03-12 – 2023-03-13 (×3): 50 mg via ORAL
  Filled 2023-03-12 (×3): qty 1

## 2023-03-12 MED ORDER — GABAPENTIN 300 MG PO CAPS
300.0000 mg | ORAL_CAPSULE | Freq: Every day | ORAL | Status: DC
  Administered 2023-03-12 – 2023-03-13 (×2): 300 mg via ORAL
  Filled 2023-03-12 (×2): qty 1

## 2023-03-12 MED ORDER — LORATADINE 10 MG PO TABS
10.0000 mg | ORAL_TABLET | Freq: Every day | ORAL | Status: DC
  Administered 2023-03-12: 10 mg via ORAL
  Filled 2023-03-12: qty 1

## 2023-03-12 MED ORDER — TORSEMIDE 20 MG PO TABS
40.0000 mg | ORAL_TABLET | Freq: Every day | ORAL | Status: DC
  Administered 2023-03-12 – 2023-03-13 (×2): 40 mg via ORAL
  Filled 2023-03-12 (×2): qty 2

## 2023-03-12 MED ORDER — OCUVITE-LUTEIN PO CAPS
1.0000 | ORAL_CAPSULE | Freq: Every day | ORAL | Status: DC
  Filled 2023-03-12: qty 1

## 2023-03-12 MED ORDER — MONTELUKAST SODIUM 10 MG PO TABS
10.0000 mg | ORAL_TABLET | Freq: Every day | ORAL | Status: DC
  Administered 2023-03-12 – 2023-03-13 (×2): 10 mg via ORAL
  Filled 2023-03-12 (×2): qty 1

## 2023-03-12 MED ORDER — MIRABEGRON ER 25 MG PO TB24
25.0000 mg | ORAL_TABLET | Freq: Every day | ORAL | Status: DC
  Administered 2023-03-12 – 2023-03-13 (×2): 25 mg via ORAL
  Filled 2023-03-12 (×3): qty 1

## 2023-03-12 MED ORDER — PROSIGHT PO TABS
1.0000 | ORAL_TABLET | Freq: Every evening | ORAL | Status: DC
  Administered 2023-03-12: 1 via ORAL
  Filled 2023-03-12 (×2): qty 1

## 2023-03-12 MED ORDER — ESCITALOPRAM OXALATE 10 MG PO TABS
10.0000 mg | ORAL_TABLET | Freq: Every day | ORAL | Status: DC
  Administered 2023-03-12 – 2023-03-13 (×2): 10 mg via ORAL
  Filled 2023-03-12 (×2): qty 1

## 2023-03-12 MED ORDER — LEFLUNOMIDE 20 MG PO TABS
20.0000 mg | ORAL_TABLET | Freq: Every day | ORAL | Status: DC
  Filled 2023-03-12: qty 1

## 2023-03-12 MED ORDER — METHOCARBAMOL 500 MG PO TABS
500.0000 mg | ORAL_TABLET | Freq: Three times a day (TID) | ORAL | Status: DC
  Administered 2023-03-12 – 2023-03-13 (×3): 500 mg via ORAL
  Filled 2023-03-12 (×3): qty 1

## 2023-03-12 MED ORDER — LAMOTRIGINE 25 MG PO TABS
100.0000 mg | ORAL_TABLET | Freq: Two times a day (BID) | ORAL | Status: DC
  Administered 2023-03-12 – 2023-03-13 (×3): 100 mg via ORAL
  Filled 2023-03-12 (×2): qty 4
  Filled 2023-03-12: qty 1

## 2023-03-12 MED ORDER — FLUTICASONE FUROATE-VILANTEROL 100-25 MCG/ACT IN AEPB
1.0000 | INHALATION_SPRAY | Freq: Every day | RESPIRATORY_TRACT | Status: DC
  Administered 2023-03-12 – 2023-03-13 (×2): 1 via RESPIRATORY_TRACT
  Filled 2023-03-12 (×2): qty 28

## 2023-03-12 MED ORDER — AMLODIPINE BESYLATE 5 MG PO TABS
5.0000 mg | ORAL_TABLET | Freq: Two times a day (BID) | ORAL | Status: DC
  Administered 2023-03-12 – 2023-03-13 (×2): 5 mg via ORAL
  Filled 2023-03-12 (×5): qty 1

## 2023-03-12 MED ORDER — PANTOPRAZOLE SODIUM 40 MG PO TBEC
40.0000 mg | DELAYED_RELEASE_TABLET | Freq: Two times a day (BID) | ORAL | Status: DC
  Administered 2023-03-12 – 2023-03-13 (×3): 40 mg via ORAL
  Filled 2023-03-12 (×3): qty 1

## 2023-03-12 MED ORDER — UMECLIDINIUM BROMIDE 62.5 MCG/ACT IN AEPB
1.0000 | INHALATION_SPRAY | Freq: Every day | RESPIRATORY_TRACT | Status: DC
  Filled 2023-03-12 (×2): qty 7

## 2023-03-12 MED ORDER — SUCRALFATE 1 G PO TABS
1.0000 g | ORAL_TABLET | Freq: Two times a day (BID) | ORAL | Status: DC
  Administered 2023-03-12 – 2023-03-13 (×3): 1 g via ORAL
  Filled 2023-03-12 (×3): qty 1

## 2023-03-12 MED ORDER — PRAVASTATIN SODIUM 20 MG PO TABS
20.0000 mg | ORAL_TABLET | Freq: Every day | ORAL | Status: DC
  Administered 2023-03-12: 20 mg via ORAL
  Filled 2023-03-12: qty 1

## 2023-03-12 MED ORDER — NEBIVOLOL HCL 5 MG PO TABS
5.0000 mg | ORAL_TABLET | Freq: Two times a day (BID) | ORAL | Status: DC
  Administered 2023-03-12 – 2023-03-13 (×3): 5 mg via ORAL
  Filled 2023-03-12 (×4): qty 1

## 2023-03-12 MED ORDER — LEFLUNOMIDE 10 MG PO TABS
20.0000 mg | ORAL_TABLET | Freq: Every day | ORAL | Status: DC
  Administered 2023-03-12 – 2023-03-13 (×2): 20 mg via ORAL
  Filled 2023-03-12 (×2): qty 2

## 2023-03-12 MED ORDER — ENSIFENTRINE 3 MG/2.5ML IN SUSP: 1.0000 | Freq: Two times a day (BID) | RESPIRATORY_TRACT | Status: DC

## 2023-03-12 MED ORDER — GABAPENTIN 300 MG PO CAPS
600.0000 mg | ORAL_CAPSULE | Freq: Every day | ORAL | Status: DC
  Administered 2023-03-12: 600 mg via ORAL
  Filled 2023-03-12: qty 2

## 2023-03-12 MED ORDER — APIXABAN 5 MG PO TABS
5.0000 mg | ORAL_TABLET | Freq: Two times a day (BID) | ORAL | Status: DC
  Administered 2023-03-12 – 2023-03-13 (×3): 5 mg via ORAL
  Filled 2023-03-12 (×3): qty 1

## 2023-03-12 NOTE — Assessment & Plan Note (Signed)
 Seems stable -Bronchodilators as needed Mucinex  -Singulair 

## 2023-03-12 NOTE — Telephone Encounter (Signed)
 Paper completed and ready to fax.

## 2023-03-12 NOTE — Telephone Encounter (Signed)
 Please fax the completed paper work and verbal order for home health.

## 2023-03-12 NOTE — Progress Notes (Signed)
 IVT consult placed for PIV placement to receive IV abx + labs. Patient is restricted to right arm. Previous PIV infiltrated in upper right arm; peripheral access is no longer available given restrictions and infiltration. Right shoulder edematous, blanching and tender. Primary RN notified. Given medication needs and lab work, consider central line or PICC. Patient may be candidate for EJ.  Jamonica Schoff R Hadja Harral, RN

## 2023-03-12 NOTE — Telephone Encounter (Signed)
 Faxed the order this morning and received an okay fax.

## 2023-03-12 NOTE — Assessment & Plan Note (Signed)
 2D echo on 03/30/2022 showed EF of 55 to 60% with grade 2 diastolic dysfunction.  Patient has trace leg edema, no SOB.  CHF seem to be compensated.  BNP 277 -Continue home torsemide 

## 2023-03-12 NOTE — Assessment & Plan Note (Signed)
 No acute concern. -Continue home medications: Lamictal , Lexapro , BuSpar 

## 2023-03-12 NOTE — Assessment & Plan Note (Addendum)
 Afebrile, preliminary blood cultures negative.  Clinically never met sepsis criteria. -Continue with Rocephin  and vancomycin  for another day -Pending ESR and CRP -Follow-up final culture results -Follow-up wound care consult recommendations -If remains stable can switch to p.o. antibiotics and discharge tomorrow

## 2023-03-12 NOTE — Hospital Course (Addendum)
 Taken from H&P.  Kerri Carter is a 83 y.o. female with medical history significant of ILD and COPD on 3 L oxygen , hypertension, hyperlipidemia, PVD, CAD, dCHF, depression with anxiety, bipolar, CKD-3B, A-fib on Eliquis , OSA on CPAP, obesity, who presents with left leg pain and dizziness.   Pt states that she had fall with left lower extremity laceration 1/22.  She was seen in ED and required multiple sutures.  Pt visited her PCP earlier today for dressing change to left lower extremity. Pt developed tachycardia with heart rate up to 169 with dizziness per pt.  She was sent to ED for further evaluation and treatment.  Hemodynamically stable on presentation except mild initial tachycardia which later improved.  Labs with leukocytosis at 13.9, BNP 277,lactic acid 1.8, procalcitonin <0.10, renal function close to baseline, negative PCR for COVID, flu and RSV. CT head negative for acute intracranial abnormalities.  Mild tenderness, hypothermia and surrounding edema of the prior sutured laceration.  Patient was started on empiric antibiotics, cultures were obtained.  2/7: Vital stable, preliminary blood cultures negative in 12 hours.  PT and OT are recommending home health.  We will continue IV ceftriaxone  and vancomycin  for 1 more day, if remains stable can be discharged back home tomorrow on p.o. antibiotics.  2/8: Remained hemodynamically stable.  Improvement of erythema and edema of left lower extremity.  Improving leukocytosis.  Mildly elevated CRP and ESR of 53 yesterday.  Patient was given another dose of ceftriaxone  and vancomycin  switched with linezolid .  She is being discharged on 2 more days of linezolid  to complete a total of 5-day course.  Home health services were ordered.  Patient was instructed regarding dressing change.  Patient will continue her current medications and need to have a close follow-up with her providers for further recommendations.

## 2023-03-12 NOTE — ED Notes (Signed)
 Pt called out to use the bedpan. This NT and RN Odilia Bennett placed pt on bed pan and instructed to call out when finished. Pt cleaned up and placed on new chucks, brief and purwick. No other needs at this time. Call light placed in reach and bed lowest position

## 2023-03-12 NOTE — Plan of Care (Signed)

## 2023-03-12 NOTE — Evaluation (Signed)
 Physical Therapy Evaluation Patient Details Name: Kerri Carter MRN: [id number] DOB: November 20, 1940 Today's Date: 03/12/2023  History of Present Illness  Kerri Carter is an 82yoF who comes to [facility] after sudden onset tachycardia dizziness while at PCP followup for wound dressing change. PMH: COPD, ILD, dCHF, AF, HTN, CAD, HLD, CKD3b, ISA, BPD, GAD.  Clinical Impression  P tin ED gurney when seen, in a fair amount of pain in 3 areas, but agreeable to basic mobility assessment. Pt does fairly well with bed mobility and coming to standing, both with adequate strength and without any physical assistance, however pt's HR is easily excitable, jumping into 130s at EOB where it says for 2-3 minutes. Pt has mild chest symptoms when rate is up, although she cannot call SOB or CP, she says I can just tell. Pt sat up in bed at EOS presents with Raisin Bran and milk as requested.       If plan is discharge home, recommend the following: Assist for transportation;Help with stairs or ramp for entrance;A little help with walking and/or transfers   Can travel by private vehicle        Equipment Recommendations None recommended by PT  Recommendations for Other Services       Functional Status Assessment Patient has had a recent decline in their functional status and demonstrates the ability to make significant improvements in function in a reasonable and predictable amount of time.     Precautions / Restrictions Precautions Precautions: Fall Restrictions LUE Weight Bearing Per Provider Order:  (Left thumb distal phalanx fracture seen 1/31 did not FU orthopedics yet)      Mobility  Bed Mobility Overal bed mobility: Needs Assistance Bed Mobility: Supine to Sit, Sit to Supine     Supine to sit: Supervision Sit to supine: Supervision   General bed mobility comments: min-modA for scooting up in bed; at EOB HR remains in 130s for ~ 2 minutes.    Transfers Overall transfer level: Needs  assistance Equipment used: Rolling walker (2 wheels) Transfers: Sit to/from Stand Sit to Stand: Supervision           General transfer comment: actually mvoing fairly well, but HR actin' up; sits after 15 seconds    Ambulation/Gait Ambulation/Gait assistance:  (deferred until HR be right)                Stairs            Wheelchair Mobility     Tilt Bed    Modified Rankin (Stroke Patients Only)       Balance                                             Pertinent Vitals/Pain Pain Assessment Pain Assessment: 0-10 Pain Score: 6  Pain Location: Rt hip, Left lower leg wound, left thumb fracture    Home Living Family/patient expects to be discharged to:: Private residence Living Arrangements: Other relatives;Children Available Help at Discharge: Family;Available PRN/intermittently;Personal care attendant Type of Home: House Home Access: Ramped entrance       Home Layout: One level Home Equipment: Rollator (4 wheels);BSC/3in1;Shower seat;Toilet riser;Grab bars - toilet;Hand held shower head;Grab bars - tub/shower;Adaptive equipment;Transport chair;Other (comment);Wheelchair - power Additional Comments: lift chair, adjustable bed that elevates at head and foot    Prior Function Prior Level of Function : Independent/Modified Independent  Mobility Comments: MOD I amb household distances with rollator ADLs Comments: Pt. reports Modified Independence with ADLs, using A/E for LE ADLs. Assist for IADLs from a personal care aide 2-3 hours a day, 3x/wk. Pt. does not drive. PCA does light meal prep, housekeeping for pt, assist for bathing. Pt had been working with HHOT 1x/wk, HHPT 2x/wk     Extremity/Trunk Assessment                Communication      Cognition Arousal: Alert Behavior During Therapy: WFL for tasks assessed/performed Overall Cognitive Status: Within Functional Limits for tasks assessed                                           General Comments      Exercises     Assessment/Plan    PT Assessment Patient needs continued PT services  PT Problem List Decreased strength;Decreased range of motion;Decreased activity tolerance;Decreased balance;Decreased mobility;Decreased coordination       PT Treatment Interventions DME instruction;Stair training;Gait training;Therapeutic activities;Therapeutic exercise;Balance training;Neuromuscular re-education;Patient/family education    PT Goals (Current goals can be found in the Care Plan section)  Acute Rehab PT Goals Patient Stated Goal: return to home, regain strength PT Goal Formulation: With patient Time For Goal Achievement: Apr 10, 2023 Potential to Achieve Goals: Good    Frequency Min 1X/week     Co-evaluation               AM-PAC PT 6 Clicks Mobility  Outcome Measure Help needed turning from your back to your side while in a flat bed without using bedrails?: A Little Help needed moving from lying on your back to sitting on the side of a flat bed without using bedrails?: A Little Help needed moving to and from a bed to a chair (including a wheelchair)?: A Little Help needed standing up from a chair using your arms (e.g., wheelchair or bedside chair)?: A Little Help needed to walk in hospital room?: A Lot Help needed climbing 3-5 steps with a railing? : A Lot 6 Click Score: 16    End of Session Equipment Utilized During Treatment: Oxygen  Activity Tolerance: Patient tolerated treatment well;Treatment limited secondary to medical complications (Comment);Patient limited by pain Patient left: in bed;with call bell/phone within reach;with nursing/sitter in room Nurse Communication: Mobility status PT Visit Diagnosis: Unsteadiness on feet (R26.81);Difficulty in walking, not elsewhere classified (R26.2);Muscle weakness (generalized) (M62.81)    Time: 9150-9076 PT Time Calculation (min) (ACUTE ONLY): 34  min   Charges:   PT Evaluation $PT Eval Moderate Complexity: 1 Mod   PT General Charges $$ ACUTE PT VISIT: 1 Visit        12:28 PM, 03/12/23 Peggye JAYSON Linear, PT, DPT Physical Therapist - [facility]  [phone] (ASCOM)    Madyx Delfin C 03/12/2023, 12:26 PM

## 2023-03-12 NOTE — Consult Note (Signed)
 WOC Nurse Consult Note: patient with fall and laceration to left lower leg 02/24/2023, had 15 sutures placed in ED, followed up with primary MD 03/05/2023 who recommended return visit in 3 days but patient did not make that appointment. Patient was seen by primary MD 03/11/2023 and started on PO antibiotics Reason for Consult: L lower leg laceration  Wound type: full thickness post trauma with 15 sutures in place  Pressure Injury POA: NA  Measurement: 14 cm crescent shaped, one small area approximately 1 cm x 2 cm superior aspect 100% red dry hemorrhagic tissue  Wound bed: 75%  red hemorrhagic tissue 25% pink moist in between some sutures  Drainage (amount, consistency, odor) minimal serosanguinous  Periwound: erythema, tenderness  Dressing procedure/placement/frequency: Cleanse L lower leg wound with Vashe wound cleanser Soila [phone]), apply Xeroform gauze Soila [phone]) cut to fit wound bed daily, cover with Telfa nonstick dressing and Kerlix roll gauze. Secure with Ace bandage.    POC discussed with patient and bedside nurse. WOC team will not follow. Re-consult if further needs arise.   Thank you,    Powell Bar MSN, RN-BC, [employer] [phone]

## 2023-03-12 NOTE — Telephone Encounter (Signed)
Faxed paperwork and received an okay confirmation

## 2023-03-12 NOTE — Assessment & Plan Note (Signed)
 Estimated body mass index is 36.05 kg/m as calculated from the following:   Height as of this encounter: 4\' 11"  (1.499 m).   Weight as of an earlier encounter on 03/11/23: 81 kg.   -Encouraged weight loss

## 2023-03-12 NOTE — Assessment & Plan Note (Signed)
 Continue CPAP at night

## 2023-03-12 NOTE — Assessment & Plan Note (Signed)
 Blood pressure mildly elevated. -Continue home torsemide , nebivolol  and amlodipine  -As needed hydralazine 

## 2023-03-12 NOTE — Assessment & Plan Note (Addendum)
 Heart rate currently controlled. -Continue home nebivolol  and Eliquis  -As needed metoprolol 

## 2023-03-12 NOTE — Assessment & Plan Note (Signed)
 Hold leflunomide with acute infection

## 2023-03-12 NOTE — Evaluation (Signed)
 Occupational Therapy Evaluation Patient Details Name: Kerri Carter MRN: [id number] DOB: 1940-05-04 Today's Date: 03/12/2023   History of Present Illness Kerri Carter is an 82yoF who comes to [facility] after sudden onset tachycardia dizziness while at PCP followup for wound dressing change. PMH: COPD, ILD, dCHF, AF, HTN, CAD, HLD, CKD3b, ISA, BPD, GAD.   Clinical Impression   Pt was seen for OT evaluation this date. Prior to hospital admission, pt was living with family and generally independent with ADL and mobility, per pt report. Pt endorses recent L thumb fracture but has not had a formal outpatient ortho follow up appointment yet. Pt does demonstrate some ADL impairments 2/2 L thumb deficits but is able to compensate somewhat for self feeding using L hand as a supporting hand to hold cereal bowl while scooping with R dom hand. Pt unable to open cereal container but was able to open the milk carton without assist. Pt presents to acute OT demonstrating impaired ADL performance and functional mobility 2/2 deficits noted below (See OT problem list for additional functional deficits). Pt currently requires increased assist for ADL and mobility. HR elevated during session, mobility/ADL modified/limited as a result. Pt would benefit from skilled OT services to address noted impairments and functional limitations (see below for any additional details) in order to maximize safety and independence while minimizing falls risk and caregiver burden.     If plan is discharge home, recommend the following: A little help with walking and/or transfers;A little help with bathing/dressing/bathroom;Assistance with cooking/housework;Assist for transportation;Help with stairs or ramp for entrance    Functional Status Assessment  Patient has had a recent decline in their functional status and demonstrates the ability to make significant improvements in function in a reasonable and predictable amount of time.  Equipment  Recommendations  None recommended by OT    Recommendations for Other Services       Precautions / Restrictions Precautions Precautions: Fall Restrictions LUE Weight Bearing Per Provider Order:  (Left thumb distal phalanx fracture seen 1/31 did not FU orthopedics yet)      Mobility Bed Mobility               General bed mobility comments: deferred, HR still elevated after PT evaluation    Transfers                   General transfer comment: deferred, HR still elevated after PT evaluation      Balance                                           ADL either performed or assessed with clinical judgement   ADL Overall ADL's : Needs assistance/impaired                                       General ADL Comments: Pt required some set up assist for meal 2/2 L thumb strength/pain limitations. Anticipate MIN A for UB/LB ADL given deficits. Deferred more formal ADL assessment 2/2 elevated resting HR after working with PT.     Vision         Perception         Praxis         Pertinent Vitals/Pain Pain Assessment Pain Assessment: 0-10 Pain Score: 6  Pain Location: Rt  hip, Left lower leg wound, left thumb fracture Pain Intervention(s): Monitored during session, Repositioned, Patient requesting pain meds-RN notified     Extremity/Trunk Assessment Upper Extremity Assessment Upper Extremity Assessment: Generalized weakness;Right hand dominant;LUE deficits/detail LUE Deficits / Details: recent Left thumb distal phalanx fracture seen 1/31, no ortho follow up yet   Lower Extremity Assessment Lower Extremity Assessment: Generalized weakness       Communication Communication Communication: No apparent difficulties   Cognition Arousal: Alert Behavior During Therapy: WFL for tasks assessed/performed Overall Cognitive Status: Within Functional Limits for tasks assessed                                        General Comments  HR in 120's at rest, improving with time to 110's at rest, RN aware.    Exercises Other Exercises Other Exercises: pt instructed in falls prevention, home/routines modifications, AE/DME for ADL   Shoulder Instructions      Home Living Family/patient expects to be discharged to:: Private residence Living Arrangements: Other relatives;Children Available Help at Discharge: Family;Available PRN/intermittently;Personal care attendant Type of Home: House Home Access: Ramped entrance     Home Layout: One level     Bathroom Shower/Tub: Producer, Television/film/video: Handicapped height Bathroom Accessibility: Yes   Home Equipment: Rollator (4 wheels);BSC/3in1;Shower seat;Toilet riser;Grab bars - toilet;Hand held shower head;Grab bars - tub/shower;Adaptive equipment;Transport chair;Other (comment);Wheelchair - power [employer] Equipment: Reacher;Sock aid;Long-handled shoe horn;Long-handled sponge Additional Comments: lift chair, adjustable bed that elevates at head and foot      Prior Functioning/Environment Prior Level of Function : Independent/Modified Independent             Mobility Comments: MOD I amb household distances with rollator ADLs Comments: Pt. reports Modified Independence with ADLs, using A/E for LE ADLs. Assist for IADLs from a personal care aide 2-3 hours a day, 3x/wk. Pt. does not drive. PCA does light meal prep, housekeeping for pt, assist for bathing. Pt had been working with HHOT 1x/wk, HHPT 2x/wk        OT Problem List: Decreased strength;Pain;Decreased range of motion;Decreased knowledge of use of DME or AE;Obesity;Impaired UE functional use      OT Treatment/Interventions: Self-care/ADL training;Therapeutic activities;Therapeutic exercise;Splinting;DME and/or AE instruction;Patient/family education;Manual therapy;Modalities    OT Goals(Current goals can be found in the care plan section) Acute Rehab OT Goals Patient Stated Goal: go  home OT Goal Formulation: With patient Time For Goal Achievement: Apr 22, 2023 Potential to Achieve Goals: Good ADL Goals Pt Will Perform Grooming: with modified independence Pt Will Transfer to Toilet: with modified independence;ambulating Pt Will Perform Toileting - Clothing Manipulation and hygiene: with modified independence Pt/caregiver will Perform Home Exercise Program: With written HEP provided;Independently;Left upper extremity  OT Frequency: Min 1X/week    Co-evaluation              AM-PAC OT 6 Clicks Daily Activity     Outcome Measure Help from another person eating meals?: A Little Help from another person taking care of personal grooming?: A Little Help from another person toileting, which includes using toliet, bedpan, or urinal?: A Little Help from another person bathing (including washing, rinsing, drying)?: A Little Help from another person to put on and taking off regular upper body clothing?: A Little Help from another person to put on and taking off regular lower body clothing?: A Little 6 Click Score: 18   End of  Session Nurse Communication: Patient requests pain meds  Activity Tolerance: Patient tolerated treatment well Patient left: in bed;with call bell/phone within reach;with nursing/sitter in room  OT Visit Diagnosis: Other abnormalities of gait and mobility (R26.89);Muscle weakness (generalized) (M62.81);Pain Pain - Right/Left: Left Pain - part of body: Hand                Time: 9081-9060 OT Time Calculation (min): 21 min Charges:  OT General Charges $OT Visit: 1 Visit OT Evaluation $OT Eval Low Complexity: 1 Low  Warren SAUNDERS., MPH, MS, OTR/L ascom [phone] 03/12/23, 2:54 PM \

## 2023-03-12 NOTE — Telephone Encounter (Signed)
 Copied from CRM [id number]. Topic: General - Other >> Mar 12, 2023 11:54 AM Corean SAUNDERS wrote: Reason for CRM: Ina Finder from [facility] wants to inform Chelsea Aurora that he is not able to provide home health verbal order requests and that will need to out source that.

## 2023-03-12 NOTE — Assessment & Plan Note (Signed)
 Continue pravastatin

## 2023-03-12 NOTE — Progress Notes (Signed)
 Progress Note   Patient: Kerri Carter FMW:[id number] DOB: 1940-09-23 DOA: 03/11/2023     1 DOS: the patient was seen and examined on 03/12/2023   Brief hospital course: Taken from H&P.  Kerri Carter is a 83 y.o. female with medical history significant of ILD and COPD on 3 L oxygen , hypertension, hyperlipidemia, PVD, CAD, dCHF, depression with anxiety, bipolar, CKD-3B, A-fib on Eliquis , OSA on CPAP, obesity, who presents with left leg pain and dizziness.   Pt states that she had fall with left lower extremity laceration 1/22.  She was seen in ED and required multiple sutures.  Pt visited her PCP earlier today for dressing change to left lower extremity. Pt developed tachycardia with heart rate up to 169 with dizziness per pt.  She was sent to ED for further evaluation and treatment.  Hemodynamically stable on presentation except mild initial tachycardia which later improved.  Labs with leukocytosis at 13.9, BNP 277,lactic acid 1.8, procalcitonin <0.10, renal function close to baseline, negative PCR for COVID, flu and RSV. CT head negative for acute intracranial abnormalities.  Mild tenderness, hypothermia and surrounding edema of the prior sutured laceration.  Patient was started on empiric antibiotics, cultures were obtained.  2/7: Vital stable, preliminary blood cultures negative in 12 hours.    Assessment and Plan: * Cellulitis of lower extremity Afebrile, preliminary blood cultures negative.  Clinically never met sepsis criteria. -Continue with Rocephin  and vancomycin  for another day -Pending ESR and CRP -Follow-up final culture results -Follow-up wound care consult recommendations -If remains stable can switch to p.o. antibiotics and discharge tomorrow  Essential hypertension Blood pressure mildly elevated. -Continue home torsemide , nebivolol  and amlodipine  -As needed hydralazine   Atrial fibrillation, chronic (HCC) Heart rate currently controlled. -Continue home nebivolol  and  Eliquis  -As needed metoprolol   CAD (coronary artery disease) -Continue pravastatin   Chronic diastolic CHF (congestive heart failure) (HCC) 2D echo on 03/30/2022 showed EF of 55 to 60% with grade 2 diastolic dysfunction.  Patient has trace leg edema, no SOB.  CHF seem to be compensated.  BNP 277 -Continue home torsemide   COPD (chronic obstructive pulmonary disease) (HCC) Seems stable -Bronchodilators as needed Mucinex  -Singulair   Stage 3b chronic kidney disease (HCC) Creatinine seems stable. -Monitor renal function -Avoid nephrotoxin  Bipolar disorder, current episode mixed, unspecified (HCC) No acute concern. -Continue home medications: Lamictal , Lexapro , BuSpar     OSA (obstructive sleep apnea) -Continue CPAP at night  Obesity (BMI 30-39.9) Estimated body mass index is 36.05 kg/m as calculated from the following:   Height as of this encounter: 4' 11 (1.499 m).   Weight as of an earlier encounter on 03/11/23: 81 kg.   -Encouraged weight loss  Rheumatoid arthritis (HCC) -Continue home leflunomide     Subjective: Patient was complaining of back pain stating that her back is hurting due to this hospital bed.  Also continue to have some left lower extremity pain.  Physical Exam: Vitals:   03/12/23 1000 03/12/23 1121 03/12/23 1200 03/12/23 1300  BP: (!) 170/65  138/76 (!) 146/80  Pulse: (!) 131   88  Resp: 13  (!) 22 15  Temp:  98.4 F (36.9 C)    TempSrc:  Oral    SpO2: 94%  97% 97%  Height:       General.  Obese elderly lady, in no acute distress. Pulmonary.  Lungs clear bilaterally, normal respiratory effort. CV.  Regular rate and rhythm, no JVD, rub or murmur. Abdomen.  Soft, nontender, nondistended, BS positive. CNS.  Alert and  oriented .  No focal neurologic deficit. Extremities.  Trace LE edema, no cyanosis, pulses intact and symmetrical.  LLE wrapped.  Data Reviewed: Prior data reviewed  Family Communication: Talked with son on  phone.  Disposition: Status is: Inpatient Remains inpatient appropriate because: Severity of illness  Planned Discharge Destination: Home with Home Health  DVT prophylaxis.  Eliquis  Time spent: 50 minutes  This record has been created using Conservation officer, historic buildings. Errors have been sought and corrected,but may not always be located. Such creation errors do not reflect on the standard of care.   Author: Amaryllis Dare, MD 03/12/2023 1:54 PM  For on call review [email or web address].

## 2023-03-12 NOTE — Assessment & Plan Note (Signed)
 Creatinine seems stable. -Monitor renal function -Avoid nephrotoxin

## 2023-03-13 DIAGNOSIS — I5032 Chronic diastolic (congestive) heart failure: Secondary | ICD-10-CM | POA: Diagnosis not present

## 2023-03-13 DIAGNOSIS — I10 Essential (primary) hypertension: Secondary | ICD-10-CM | POA: Diagnosis not present

## 2023-03-13 DIAGNOSIS — L03116 Cellulitis of left lower limb: Secondary | ICD-10-CM | POA: Diagnosis not present

## 2023-03-13 DIAGNOSIS — I482 Chronic atrial fibrillation, unspecified: Secondary | ICD-10-CM | POA: Diagnosis not present

## 2023-03-13 LAB — CBC
HCT: 31 % — ABNORMAL LOW (ref 36.0–46.0)
Hemoglobin: 9.6 g/dL — ABNORMAL LOW (ref 12.0–15.0)
MCH: 26.9 pg (ref 26.0–34.0)
MCHC: 31 g/dL (ref 30.0–36.0)
MCV: 86.8 fL (ref 80.0–100.0)
Platelets: 328 10*3/uL (ref 150–400)
RBC: 3.57 MIL/uL — ABNORMAL LOW (ref 3.87–5.11)
RDW: 15.8 % — ABNORMAL HIGH (ref 11.5–15.5)
WBC: 11.1 10*3/uL — ABNORMAL HIGH (ref 4.0–10.5)
nRBC: 0.4 % — ABNORMAL HIGH (ref 0.0–0.2)

## 2023-03-13 LAB — BASIC METABOLIC PANEL
Anion gap: 13 (ref 5–15)
BUN: 14 mg/dL (ref 8–23)
CO2: 30 mmol/L (ref 22–32)
Calcium: 8.3 mg/dL — ABNORMAL LOW (ref 8.9–10.3)
Chloride: 95 mmol/L — ABNORMAL LOW (ref 98–111)
Creatinine, Ser: 1.18 mg/dL — ABNORMAL HIGH (ref 0.44–1.00)
GFR, Estimated: 47 mL/min — ABNORMAL LOW (ref >60)
Glucose, Bld: 138 mg/dL — ABNORMAL HIGH (ref 70–99)
Potassium: 3 mmol/L — ABNORMAL LOW (ref 3.5–5.1)
Sodium: 138 mmol/L (ref 135–145)

## 2023-03-13 LAB — C-REACTIVE PROTEIN: CRP: 1.4 mg/dL — ABNORMAL HIGH (ref <1.0)

## 2023-03-13 MED ORDER — VANCOMYCIN HCL IN DEXTROSE 1-5 GM/200ML-% IV SOLN
1000.0000 mg | INTRAVENOUS | Status: DC
  Filled 2023-03-13: qty 200

## 2023-03-13 MED ORDER — LINEZOLID 600 MG PO TABS: 600.0000 mg | ORAL_TABLET | Freq: Two times a day (BID) | ORAL | 0 refills | Status: AC

## 2023-03-13 MED ORDER — POTASSIUM CHLORIDE CRYS ER 20 MEQ PO TBCR
40.0000 meq | EXTENDED_RELEASE_TABLET | Freq: Once | ORAL | Status: AC
  Administered 2023-03-13: 40 meq via ORAL
  Filled 2023-03-13: qty 2

## 2023-03-13 MED ORDER — LINEZOLID 600 MG/300ML IV SOLN
600.0000 mg | Freq: Once | INTRAVENOUS | Status: AC
  Administered 2023-03-13: 600 mg via INTRAVENOUS
  Filled 2023-03-13: qty 300

## 2023-03-13 NOTE — Discharge Summary (Signed)
 Physician Discharge Summary   Patient: Kerri Carter MRN: [id number] DOB: 17-Aug-1940  Admit date:     03/11/2023  Discharge date: 03/13/23  Discharge Physician: Amaryllis Dare   PCP: Hope Merle, MD   Recommendations at discharge:  Please obtain CBC and BMP on follow-up Patient is being discharged on 2 more days of linezolid , please ensure the completion of antibiotics. Follow-up with primary care provider within a week  Discharge Diagnoses: Principal Problem:   Cellulitis of lower extremity Active Problems:   Essential hypertension   Atrial fibrillation, chronic (HCC)   CAD (coronary artery disease)   Chronic diastolic CHF (congestive heart failure) (HCC)   COPD (chronic obstructive pulmonary disease) (HCC)   Stage 3b chronic kidney disease (HCC)   Bipolar disorder, current episode mixed, unspecified (HCC)   OSA (obstructive sleep apnea)   Obesity (BMI 30-39.9)   Rheumatoid arthritis (HCC)   Depression with anxiety   Dizziness   Hospital Course: Taken from H&P.  Kerri Carter is a 83 y.o. female with medical history significant of ILD and COPD on 3 L oxygen , hypertension, hyperlipidemia, PVD, CAD, dCHF, depression with anxiety, bipolar, CKD-3B, A-fib on Eliquis , OSA on CPAP, obesity, who presents with left leg pain and dizziness.   Pt states that she had fall with left lower extremity laceration 1/22.  She was seen in ED and required multiple sutures.  Pt visited her PCP earlier today for dressing change to left lower extremity. Pt developed tachycardia with heart rate up to 169 with dizziness per pt.  She was sent to ED for further evaluation and treatment.  Hemodynamically stable on presentation except mild initial tachycardia which later improved.  Labs with leukocytosis at 13.9, BNP 277,lactic acid 1.8, procalcitonin <0.10, renal function close to baseline, negative PCR for COVID, flu and RSV. CT head negative for acute intracranial abnormalities.  Mild tenderness,  hypothermia and surrounding edema of the prior sutured laceration.  Patient was started on empiric antibiotics, cultures were obtained.  2/7: Vital stable, preliminary blood cultures negative in 12 hours.  PT and OT are recommending home health.  We will continue IV ceftriaxone  and vancomycin  for 1 more day, if remains stable can be discharged back home tomorrow on p.o. antibiotics.  2/8: Remained hemodynamically stable.  Improvement of erythema and edema of left lower extremity.  Improving leukocytosis.  Mildly elevated CRP and ESR of 53 yesterday.  Patient was given another dose of ceftriaxone  and vancomycin  switched with linezolid .  She is being discharged on 2 more days of linezolid  to complete a total of 5-day course.  Home health services were ordered.  Patient was instructed regarding dressing change.  Patient will continue her current medications and need to have a close follow-up with her providers for further recommendations.  Assessment and Plan: * Cellulitis of lower extremity Afebrile, preliminary blood cultures negative.  Clinically never met sepsis criteria.  Blood cultures remain negative -Patient received ceftriaxone  and linezolid  before discharge. -Mildly elevated inflammatory markers -Being discharged on 2 more days of linezolid  to complete a 5-day course  Essential hypertension Blood pressure mildly elevated. -Continue home torsemide , nebivolol  and amlodipine  -As needed hydralazine   Atrial fibrillation, chronic (HCC) Heart rate currently controlled. -Continue home nebivolol  and Eliquis  -As needed metoprolol   CAD (coronary artery disease) -Continue pravastatin   Chronic diastolic CHF (congestive heart failure) (HCC) 2D echo on 03/30/2022 showed EF of 55 to 60% with grade 2 diastolic dysfunction.  Patient has trace leg edema, no SOB.  CHF seem to be  compensated.  BNP 277 -Continue home torsemide   COPD (chronic obstructive pulmonary disease) (HCC) Seems  stable -Bronchodilators as needed Mucinex  -Singulair   Stage 3b chronic kidney disease (HCC) Creatinine seems stable. -Monitor renal function -Avoid nephrotoxin  Bipolar disorder, current episode mixed, unspecified (HCC) No acute concern. -Continue home medications: Lamictal , Lexapro , BuSpar     OSA (obstructive sleep apnea) -Continue CPAP at night  Obesity (BMI 30-39.9) Estimated body mass index is 36.05 kg/m as calculated from the following:   Height as of this encounter: 4' 11 (1.499 m).   Weight as of an earlier encounter on 03/11/23: 81 kg.   -Encouraged weight loss  Rheumatoid arthritis (HCC) -Continue home leflunomide     Consultants: None Procedures performed: None Disposition: Home health Diet recommendation:  Discharge Diet Orders (From admission, onward)     Start     Ordered   03/13/23 0000  Diet - low sodium heart healthy        03/13/23 1114           Cardiac diet DISCHARGE MEDICATION: Allergies as of 03/13/2023       Reactions   Ceftin [cefuroxime Axetil] Anaphylaxis   Lisinopril Anaphylaxis   Sulfa Antibiotics Other (See Comments)   Face swelling   Sulfasalazine Anaphylaxis   Morphine  Other (See Comments)   Per patient, low blood pressure issues that requires action to raise it back up. Can take small infrequent doses   Xarelto [rivaroxaban] Other (See Comments)   Stomach burning, bleeding, and tar in stool   Adhesive [tape] Rash   Paper tape and tega derm OK   Antihistamines, Chlorpheniramine-type Other (See Comments)   Makes pt hyper   Antivert [meclizine Hcl] Other (See Comments)   Bladder will not empty   Aspirin  Other (See Comments)   Sulfasalazine allergy cross reacts   Contrast Media [iodinated Contrast Media] Rash   she is able to use betadine scrubs.   Decongestant [pseudoephedrine Hcl] Other (See Comments)   Makes pt hyper   Doxycycline Other (See Comments)   GI upset   Levaquin  [levofloxacin  In D5w] Rash   Polymyxin B   Other (See Comments)   Facial rash   Tetanus Toxoids Rash, Other (See Comments)   Fever and hot to touch at injection site        Medication List     STOP taking these medications    clindamycin  300 MG capsule Commonly known as: CLEOCIN        TAKE these medications    albuterol  108 (90 Base) MCG/ACT inhaler Commonly known as: Ventolin  HFA Inhale 2 puffs into the lungs every 6 (six) hours as needed for wheezing or shortness of breath.   albuterol  (2.5 MG/3ML) 0.083% nebulizer solution Commonly known as: PROVENTIL  Take 3 mLs (2.5 mg total) by nebulization every 6 (six) hours as needed for wheezing or shortness of breath.   amLODipine  5 MG tablet Commonly known as: NORVASC  Take 1 tablet (5 mg total) by mouth daily. What changed: when to take this   apixaban  5 MG Tabs tablet Commonly known as: ELIQUIS  Take 5 mg by mouth 2 (two) times daily.   busPIRone  5 MG tablet Commonly known as: BUSPAR  Take 1 tablet (5 mg total) by mouth 2 (two) times daily.   cetirizine  10 MG tablet Commonly known as: ZYRTEC  TAKE 1 TABLET BY MOUTH EVERYDAY AT BEDTIME   dicyclomine  10 MG capsule Commonly known as: BENTYL  TAKE 1 CAPSULE (10 MG TOTAL) BY MOUTH 2 (TWO) TIMES DAILY AS NEEDED FOR SPASMS.  Enbrel SureClick 50 MG/ML injection Generic drug: etanercept Inject 50 mg into the skin once a week. Takes on Fridays   escitalopram  10 MG tablet Commonly known as: LEXAPRO  Take 1 tablet (10 mg total) by mouth daily.   famotidine  20 MG tablet Commonly known as: PEPCID  TAKE 1 TABLET BY MOUTH EVERYDAY AT BEDTIME   gabapentin  300 MG capsule Commonly known as: NEURONTIN  Take 300 mg in the morning and 600 mg at night.   Gemtesa  75 MG Tabs Generic drug: Vibegron  TAKE 75 MG BY MOUTH DAILY.   lamoTRIgine  100 MG tablet Commonly known as: LAMICTAL  TAKE 1 TAB 2 TIMES DAILY. FURTHER REFILLS NEW PSYCH FOR ALL PSYCH MEDS ONLY TEMP SUPPLY FROM PCP   leflunomide  20 MG tablet Commonly known as:  ARAVA  Take 1 tablet (20 mg total) by mouth daily.   lidocaine  5 % Commonly known as: Lidoderm  Place 1 patch onto the skin 2 (two) times daily as needed. Remove & Discard patch within 12 hours or as directed by MD   linezolid  600 MG tablet Commonly known as: ZYVOX  Take 1 tablet (600 mg total) by mouth 2 (two) times daily for 2 days.   lovastatin  20 MG tablet Commonly known as: MEVACOR  Take 1 tablet (20 mg total) by mouth daily at 6 PM.   methocarbamol  500 MG tablet Commonly known as: ROBAXIN  Take 500 mg by mouth 3 (three) times daily.   mometasone  50 MCG/ACT nasal spray Commonly known as: NASONEX  USE 1 SPRAY TO EACH NOSTRIL TWICE A DAY   montelukast  10 MG tablet Commonly known as: SINGULAIR  TAKE 1 TABLET BY MOUTH EVERY DAY   multivitamin-lutein  Caps capsule Take 1 capsule by mouth at bedtime.   nebivolol  5 MG tablet Commonly known as: Bystolic  Take 1 tablet (5 mg total) by mouth in the morning and at bedtime. (Note dose changed from 1/2 10 mg bid to 5 mg bid)   Ohtuvayre  3 MG/2.5ML Susp Generic drug: Ensifentrine  Inhale 1 ampule into the lungs in the morning and at bedtime.   ondansetron  4 MG tablet Commonly known as: ZOFRAN  Take 1 tablet (4 mg total) by mouth every 6 (six) hours as needed for nausea.   OXYGEN  Inhale 3 L into the lungs continuous.   pantoprazole  40 MG tablet Commonly known as: PROTONIX  TAKE 1 TABLET BY MOUTH 2 TIMES DAILY 30 MIN BEFORE FOOD (NOTE REDUCTION IN FREQUENCY)   predniSONE  5 MG tablet Commonly known as: DELTASONE  TAKE 1 TABLET BY MOUTH EVERY DAY WITH BREAKFAST   Prolia  60 MG/ML Sosy injection Generic drug: denosumab  Inject 60 mg into the skin every 6 (six) months.   QUEtiapine  50 MG tablet Commonly known as: SEROQUEL  Take 50 mg by mouth at bedtime. What changed: Another medication with the same name was removed. Continue taking this medication, and follow the directions you see here.   sucralfate  1 g tablet Commonly known as:  CARAFATE  TAKE 1 TABLET BY MOUTH TWICE A DAY   torsemide  20 MG tablet Commonly known as: DEMADEX  Take 2 tablets (40 mg total) by mouth daily.   traMADol  50 MG tablet Commonly known as: ULTRAM  Take 1 tablet (50 mg total) by mouth every 12 (twelve) hours as needed.   Trelegy Ellipta  100-62.5-25 MCG/ACT Aepb Generic drug: Fluticasone -Umeclidin-Vilant Inhale 1 puff into the lungs daily.               Discharge Care Instructions  (From admission, onward)           Start  Ordered   03/13/23 0000  Discharge wound care:       Comments: Cleanse L lower leg wound with Vashe wound cleanser Soila [phone]), apply Xeroform gauze ([place] [phone]) cut to fit wound bed daily, cover with Telfa nonstick dressing and Kerlix roll gauze. Secure with Ace bandage.   03/13/23 1114            Follow-up Information     Hope Merle, MD. Schedule an appointment as soon as possible for a visit in 1 week(s).   Specialty: Family Medicine Contact information: [address] [place] [address] [phone]                Discharge Exam: Filed Weights   03/13/23 0300  Weight: 79.7 kg   General.  Frail elderly lady, in no acute distress. Pulmonary.  Lungs clear bilaterally, normal respiratory effort. CV.  Regular rate and rhythm, no JVD, rub or murmur. Abdomen.  Soft, nontender, nondistended, BS positive. CNS.  Alert and oriented .  No focal neurologic deficit. Extremities.  No edema, no cyanosis, pulses intact and symmetrical.  LLE with improved erythema and edema surrounding sutures.  Condition at discharge: stable  The results of significant diagnostics from this hospitalization (including imaging, microbiology, ancillary and laboratory) are listed below for reference.   Imaging Studies: CT Head Wo Contrast Result Date: 03/11/2023 CLINICAL DATA:  Dizziness EXAM: CT HEAD WITHOUT CONTRAST TECHNIQUE: Contiguous axial images were obtained from the base of the skull  through the vertex without intravenous contrast. RADIATION DOSE REDUCTION: This exam was performed according to the departmental dose-optimization program which includes automated exposure control, adjustment of the mA and/or kV according to patient size and/or use of iterative reconstruction technique. COMPARISON:  02/22/2023 CT head FINDINGS: Brain: No evidence of acute infarction, hemorrhage, mass, mass effect, or midline shift. No hydrocephalus or extra-axial fluid collection. Age related cerebral atrophy. Periventricular white matter changes, likely the sequela of chronic small vessel ischemic disease. Ex vacuo dilatation the ventricles. Vascular: No hyperdense vessel. Skull: Negative for fracture or focal lesion. Sinuses/Orbits: Mucosal thickening in the left-greater-than-right mastoid air cells. Status post bilateral lens replacements. Other: The mastoid air cells are well aerated. IMPRESSION: No acute intracranial process. Electronically Signed   By: Donald Campion M.D.   On: 03/11/2023 15:36   DG Finger Thumb Left Result Date: 03/05/2023 CLINICAL DATA:  Following ago.  Thumb pain. EXAM: LEFT THUMB 2+V COMPARISON:  None Available. FINDINGS: There is diffuse decreased bone mineralization. Moderate to severe dorsal and lateral thumb interphalangeal joint space narrowing, subchondral sclerosis, and dorsal osteophytosis. There is less than 1 mm cortical step-off at the medial/ulnar aspect of the proximal metaphysis of the distal phalanx of the thumb on frontal view. There is oblique linear lucency within the mid to volar aspect of the proximal aspect of the distal phalanx of the thumb on lateral view. These findings are suspicious for an acute nondisplaced fracture that comes close to the proximal articular surface without definitely extending through the articular surface. Moderate to severe dorsal thumb metacarpophalangeal joint space narrowing, subchondral sclerosis, and peripheral osteophytosis. Moderate  to severe thumb carpometacarpal joint space narrowing and subchondral sclerosis with mild right peripheral osteophytosis. Moderate triscaphe joint space narrowing. Moderate distal radioulnar joint space narrowing. Old nonunited distal ulnar styloid fracture. IMPRESSION: 1. Acute nondisplaced fracture of the proximal metaphysis of the distal phalanx of the thumb. 2. Moderate to severe thumb interphalangeal, thumb metacarpophalangeal, and thumb carpometacarpal osteoarthritis. Electronically Signed   By: Tanda Lyons  M.D.   On: 03/05/2023 18:58   DG Hip Unilat W or Wo Pelvis 2-3 Views Left Result Date: 02/24/2023 CLINICAL DATA:  Fall in shower bilateral hip pain EXAM: DG HIP (WITH OR WITHOUT PELVIS) 2-3V LEFT COMPARISON:  03/18/2022, 09/08/2021 FINDINGS: Lumbar spine hardware. SI joints are non widened. Pubic symphysis and rami are intact. Bilateral hip replacements with normal alignment. No acute fracture is seen IMPRESSION: No acute osseous abnormality. Bilateral hip replacements. Electronically Signed   By: Luke Bun M.D.   On: 02/24/2023 15:33   DG Tibia/Fibula Left Result Date: 02/24/2023 CLINICAL DATA:  Fall, laceration EXAM: LEFT TIBIA AND FIBULA - 2 VIEW COMPARISON:  None Available. FINDINGS: No definitive fracture or malalignment. Probable laceration to the lateral aspect of the distal lower leg. No radiopaque foreign body IMPRESSION: No acute osseous abnormality. Electronically Signed   By: Luke Bun M.D.   On: 02/24/2023 15:32   CT HEAD WO CONTRAST ( ) Result Date: 02/22/2023 CLINICAL DATA:  fall EXAM: CT HEAD WITHOUT CONTRAST CT MAXILLOFACIAL WITHOUT CONTRAST CT CERVICAL SPINE WITHOUT CONTRAST TECHNIQUE: Multidetector CT imaging of the head, cervical spine, and maxillofacial structures were performed using the standard protocol without intravenous contrast. Multiplanar CT image reconstructions of the cervical spine and maxillofacial structures were also generated. RADIATION DOSE  REDUCTION: This exam was performed according to the departmental dose-optimization program which includes automated exposure control, adjustment of the mA and/or kV according to patient size and/or use of iterative reconstruction technique. COMPARISON:  Head CT 07/18/2021 FINDINGS: CT HEAD FINDINGS Brain: No hemorrhage. Mild ventriculomegaly, unchanged compared to 07/17/2021. No extra-axial fluid collection. No CT evidence of an acute cortical infarct. No mass effect. No mass lesion. Vascular: No hyperdense vessel or unexpected calcification. Skull: Soft tissue hematoma along the midline frontal scalp. No evidence of an underlying calvarial fracture. Other: None. CT MAXILLOFACIAL FINDINGS Osseous: No fracture or mandibular dislocation. No destructive process. Orbits: Negative. No traumatic or inflammatory finding. Sinuses: No middle ear or mastoid effusion. Paranasal sinuses are notable for mucosal thickening in the posterior ethmoid air cells on the left. Bilateral lens replacement. Orbits are otherwise unremarkable. Soft tissues: Negative. CT CERVICAL SPINE FINDINGS Alignment: Normal. Skull base and vertebrae: No acute fracture. No primary bone lesion or focal pathologic process. Soft tissues and spinal canal: No prevertebral fluid or swelling. No visible canal hematoma. Disc levels:  No CT evidence of high-grade spinal canal stenosis Upper chest: Negative. Other: None IMPRESSION: 1. No CT evidence of intracranial injury. 2. Soft tissue hematoma along the midline frontal scalp. No evidence of an underlying calvarial fracture. 3. No acute facial bone fracture. 4. No acute fracture or traumatic subluxation of the cervical spine. Electronically Signed   By: Lyndall Gore M.D.   On: 02/22/2023 17:36   CT Cervical Spine Wo Contrast Result Date: 02/22/2023 CLINICAL DATA:  fall EXAM: CT HEAD WITHOUT CONTRAST CT MAXILLOFACIAL WITHOUT CONTRAST CT CERVICAL SPINE WITHOUT CONTRAST TECHNIQUE: Multidetector CT imaging of the  head, cervical spine, and maxillofacial structures were performed using the standard protocol without intravenous contrast. Multiplanar CT image reconstructions of the cervical spine and maxillofacial structures were also generated. RADIATION DOSE REDUCTION: This exam was performed according to the departmental dose-optimization program which includes automated exposure control, adjustment of the mA and/or kV according to patient size and/or use of iterative reconstruction technique. COMPARISON:  Head CT 07/18/2021 FINDINGS: CT HEAD FINDINGS Brain: No hemorrhage. Mild ventriculomegaly, unchanged compared to 07/17/2021. No extra-axial fluid collection. No CT evidence of an acute cortical  infarct. No mass effect. No mass lesion. Vascular: No hyperdense vessel or unexpected calcification. Skull: Soft tissue hematoma along the midline frontal scalp. No evidence of an underlying calvarial fracture. Other: None. CT MAXILLOFACIAL FINDINGS Osseous: No fracture or mandibular dislocation. No destructive process. Orbits: Negative. No traumatic or inflammatory finding. Sinuses: No middle ear or mastoid effusion. Paranasal sinuses are notable for mucosal thickening in the posterior ethmoid air cells on the left. Bilateral lens replacement. Orbits are otherwise unremarkable. Soft tissues: Negative. CT CERVICAL SPINE FINDINGS Alignment: Normal. Skull base and vertebrae: No acute fracture. No primary bone lesion or focal pathologic process. Soft tissues and spinal canal: No prevertebral fluid or swelling. No visible canal hematoma. Disc levels:  No CT evidence of high-grade spinal canal stenosis Upper chest: Negative. Other: None IMPRESSION: 1. No CT evidence of intracranial injury. 2. Soft tissue hematoma along the midline frontal scalp. No evidence of an underlying calvarial fracture. 3. No acute facial bone fracture. 4. No acute fracture or traumatic subluxation of the cervical spine. Electronically Signed   By: Lyndall Gore  M.D.   On: 02/22/2023 17:36   CT MAXILLOFACIAL WO CONTRAST Result Date: 02/22/2023 CLINICAL DATA:  fall EXAM: CT HEAD WITHOUT CONTRAST CT MAXILLOFACIAL WITHOUT CONTRAST CT CERVICAL SPINE WITHOUT CONTRAST TECHNIQUE: Multidetector CT imaging of the head, cervical spine, and maxillofacial structures were performed using the standard protocol without intravenous contrast. Multiplanar CT image reconstructions of the cervical spine and maxillofacial structures were also generated. RADIATION DOSE REDUCTION: This exam was performed according to the departmental dose-optimization program which includes automated exposure control, adjustment of the mA and/or kV according to patient size and/or use of iterative reconstruction technique. COMPARISON:  Head CT 07/18/2021 FINDINGS: CT HEAD FINDINGS Brain: No hemorrhage. Mild ventriculomegaly, unchanged compared to 07/17/2021. No extra-axial fluid collection. No CT evidence of an acute cortical infarct. No mass effect. No mass lesion. Vascular: No hyperdense vessel or unexpected calcification. Skull: Soft tissue hematoma along the midline frontal scalp. No evidence of an underlying calvarial fracture. Other: None. CT MAXILLOFACIAL FINDINGS Osseous: No fracture or mandibular dislocation. No destructive process. Orbits: Negative. No traumatic or inflammatory finding. Sinuses: No middle ear or mastoid effusion. Paranasal sinuses are notable for mucosal thickening in the posterior ethmoid air cells on the left. Bilateral lens replacement. Orbits are otherwise unremarkable. Soft tissues: Negative. CT CERVICAL SPINE FINDINGS Alignment: Normal. Skull base and vertebrae: No acute fracture. No primary bone lesion or focal pathologic process. Soft tissues and spinal canal: No prevertebral fluid or swelling. No visible canal hematoma. Disc levels:  No CT evidence of high-grade spinal canal stenosis Upper chest: Negative. Other: None IMPRESSION: 1. No CT evidence of intracranial injury. 2.  Soft tissue hematoma along the midline frontal scalp. No evidence of an underlying calvarial fracture. 3. No acute facial bone fracture. 4. No acute fracture or traumatic subluxation of the cervical spine. Electronically Signed   By: Lyndall Gore M.D.   On: 02/22/2023 17:36   DG Chest 2 View Result Date: 02/11/2023 CLINICAL DATA:  Shortness of breath. EXAM: CHEST - 2 VIEW COMPARISON:  08/12/2022 FINDINGS: The heart size and mediastinal contours are within normal limits. Scarring is again seen in the right upper lobe with several old right rib fracture deformities. The lungs are otherwise clear. Bilateral shoulder prostheses again noted. IMPRESSION: No active cardiopulmonary disease. Stable right upper lobe scarring and old right rib fracture deformities. Electronically Signed   By: Norleen DELENA Kil M.D.   On: 02/11/2023 15:01  Microbiology: Results for orders placed or performed during the hospital encounter of 03/11/23  Resp panel by RT-PCR (RSV, Flu A&B, Covid) Anterior Nasal Swab     Status: None   Collection Time: 03/11/23  6:42 PM   Specimen: Anterior Nasal Swab  Result Value Ref Range Status   SARS Coronavirus 2 by RT PCR NEGATIVE NEGATIVE Final    Comment: (NOTE) SARS-CoV-2 target nucleic acids are NOT DETECTED.  The SARS-CoV-2 RNA is generally detectable in upper respiratory specimens during the acute phase of infection. The lowest concentration of SARS-CoV-2 viral copies this assay can detect is 138 copies/mL. A negative result does not preclude SARS-Cov-2 infection and should not be used as the sole basis for treatment or other patient management decisions. A negative result may occur with  improper specimen collection/handling, submission of specimen other than nasopharyngeal swab, presence of viral mutation(s) within the areas targeted by this assay, and inadequate number of viral copies(<138 copies/mL). A negative result must be combined with clinical observations, patient  history, and epidemiological information. The expected result is Negative.  Fact Sheet for Patients:  bloggercourse.com  Fact Sheet for Healthcare Providers:  seriousbroker.it  This test is no t yet approved or cleared by the [place]  FDA and  has been authorized for detection and/or diagnosis of SARS-CoV-2 by FDA under an Emergency Use Authorization (EUA). This EUA will remain  in effect (meaning this test can be used) for the duration of the COVID-19 declaration under Section 564(b)(1) of the Act, 21 U.S.C.section 360bbb-3(b)(1), unless the authorization is terminated  or revoked sooner.       Influenza A by PCR NEGATIVE NEGATIVE Final   Influenza B by PCR NEGATIVE NEGATIVE Final    Comment: (NOTE) The Xpert Xpress SARS-CoV-2/FLU/RSV plus assay is intended as an aid in the diagnosis of influenza from Nasopharyngeal swab specimens and should not be used as a sole basis for treatment. Nasal washings and aspirates are unacceptable for Xpert Xpress SARS-CoV-2/FLU/RSV testing.  Fact Sheet for Patients: bloggercourse.com  Fact Sheet for Healthcare Providers: seriousbroker.it  This test is not yet approved or cleared by the [place]  FDA and has been authorized for detection and/or diagnosis of SARS-CoV-2 by FDA under an Emergency Use Authorization (EUA). This EUA will remain in effect (meaning this test can be used) for the duration of the COVID-19 declaration under Section 564(b)(1) of the Act, 21 U.S.C. section 360bbb-3(b)(1), unless the authorization is terminated or revoked.     Resp Syncytial Virus by PCR NEGATIVE NEGATIVE Final    Comment: (NOTE) Fact Sheet for Patients: bloggercourse.com  Fact Sheet for Healthcare Providers: seriousbroker.it  This test is not yet approved or cleared by the [place]  FDA  and has been authorized for detection and/or diagnosis of SARS-CoV-2 by FDA under an Emergency Use Authorization (EUA). This EUA will remain in effect (meaning this test can be used) for the duration of the COVID-19 declaration under Section 564(b)(1) of the Act, 21 U.S.C. section 360bbb-3(b)(1), unless the authorization is terminated or revoked.  Performed at [facility], [address] [place], [place] [address]   Blood culture (routine x 2)     Status: None (Preliminary result)   Collection Time: 03/11/23  6:51 PM   Specimen: BLOOD RIGHT ARM  Result Value Ref Range Status   Specimen Description BLOOD RIGHT ARM  Final   Special Requests   Final    BOTTLES DRAWN AEROBIC AND ANAEROBIC Blood Culture results may not be optimal due to  an inadequate volume of blood received in culture bottles   Culture   Final    NO GROWTH 2 DAYS Performed at [facility], [address] [place], [place] [address]    Report Status PENDING  Incomplete  Blood culture (routine x 2)     Status: None (Preliminary result)   Collection Time: 03/11/23  6:51 PM   Specimen: BLOOD LEFT ARM  Result Value Ref Range Status   Specimen Description BLOOD LEFT ARM  Final   Special Requests   Final    BOTTLES DRAWN AEROBIC AND ANAEROBIC Blood Culture results may not be optimal due to an inadequate volume of blood received in culture bottles   Culture   Final    NO GROWTH 2 DAYS Performed at [facility], [address], [place], [place] [address]    Report Status PENDING  Incomplete   *Note: Due to a large number of results and/or encounters for the requested time period, some results have not been displayed. A complete set of results can be found in Results Review.    Labs: CBC: Recent Labs  Lab 03/11/23 1402 03/12/23 1841 03/13/23 1051  WBC 13.9* 13.1* 11.1*  HGB 10.0* 10.3* 9.6*  HCT 32.6* 32.2* 31.0*  MCV 88.3 86.1 86.8  PLT 372 372 328   Basic Metabolic  Panel: Recent Labs  Lab 03/11/23 1402 03/12/23 1841  NA 139 137  K 3.7 3.2*  CL 98 94*  CO2 24 28  GLUCOSE 154* 191*  BUN 18 13  CREATININE 1.20* 1.10*  CALCIUM  8.9 8.6*   Liver Function Tests: No results for input(s): AST, ALT, ALKPHOS, BILITOT, PROT, ALBUMIN  in the last 168 hours. CBG: No results for input(s): GLUCAP in the last 168 hours.  Discharge time spent: greater than 30 minutes.  This record has been created using Conservation officer, historic buildings. Errors have been sought and corrected,but may not always be located. Such creation errors do not reflect on the standard of care.   Signed: Amaryllis Dare, MD [facility] 03/13/2023

## 2023-03-15 ENCOUNTER — Ambulatory Visit: Payer: Self-pay | Admitting: Physician Assistant

## 2023-03-15 ENCOUNTER — Telehealth: Payer: Self-pay

## 2023-03-15 DIAGNOSIS — S62525A Nondisplaced fracture of distal phalanx of left thumb, initial encounter for closed fracture: Secondary | ICD-10-CM | POA: Diagnosis not present

## 2023-03-15 DIAGNOSIS — M25572 Pain in left ankle and joints of left foot: Secondary | ICD-10-CM | POA: Diagnosis not present

## 2023-03-15 DIAGNOSIS — S81812A Laceration without foreign body, left lower leg, initial encounter: Secondary | ICD-10-CM | POA: Diagnosis not present

## 2023-03-15 DIAGNOSIS — S62522A Displaced fracture of distal phalanx of left thumb, initial encounter for closed fracture: Secondary | ICD-10-CM | POA: Diagnosis not present

## 2023-03-15 DIAGNOSIS — R600 Localized edema: Secondary | ICD-10-CM | POA: Diagnosis not present

## 2023-03-15 NOTE — Transitions of Care (Post Inpatient/ED Visit) (Signed)
   03/15/2023  Name: Kerri Carter MRN: [id number] DOB: 03/10/1940  Today's TOC FU Call Status: Today's TOC FU Call Status:: Unsuccessful Call (1st Attempt) Unsuccessful Call (1st Attempt) Date: 03/15/23  Attempted to reach the patient regarding the most recent Inpatient/ED visit.  Follow Up Plan: Additional outreach attempts will be made to reach the patient to complete the Transitions of Care (Post Inpatient/ED visit) call.   Gareld June, BSN, RN [place]  VBCI - [employer] [facility] RN Care Manager [phone]

## 2023-03-16 ENCOUNTER — Ambulatory Visit: Payer: Medicare PPO | Admitting: Pulmonary Disease

## 2023-03-16 LAB — CULTURE, BLOOD (ROUTINE X 2)
Culture: NO GROWTH
Culture: NO GROWTH

## 2023-03-18 ENCOUNTER — Telehealth: Payer: Self-pay

## 2023-03-18 NOTE — Telephone Encounter (Signed)
Reason for CRM: Patient returning call. She said she is not coming in to be seen and wants a medication to be called in. I did let her know that she needs to be seen in person and she declined.

## 2023-03-18 NOTE — Telephone Encounter (Signed)
Left message to return call to our office.  Pt will need to schedule an appointment to be seen.

## 2023-03-18 NOTE — Telephone Encounter (Signed)
Copied from CRM [id number]. Topic: Clinical - Medication Question >> Mar 18, 2023 12:32 PM Sonny Dandy B wrote: Reason for CRM: Pt called to request a prescription for Theraflu. Pt states she has the flu. Needs the medication. Please give pt a call back at [phone]

## 2023-03-18 NOTE — Telephone Encounter (Signed)
Copied from CRM [id number]. Topic: General - Call Back - No Documentation >> Mar 18, 2023  3:02 PM [place] wrote: Reason for CRM: Patient returning call. She said she is not coming in to be seen and wants a medication to be called in. I did let her know that she needs to be seen in person and she declined.

## 2023-03-19 NOTE — Telephone Encounter (Signed)
Noted

## 2023-03-20 DIAGNOSIS — J9621 Acute and chronic respiratory failure with hypoxia: Secondary | ICD-10-CM | POA: Diagnosis not present

## 2023-03-20 DIAGNOSIS — J441 Chronic obstructive pulmonary disease with (acute) exacerbation: Secondary | ICD-10-CM | POA: Diagnosis not present

## 2023-03-20 DIAGNOSIS — J849 Interstitial pulmonary disease, unspecified: Secondary | ICD-10-CM | POA: Diagnosis not present

## 2023-03-20 DIAGNOSIS — D631 Anemia in chronic kidney disease: Secondary | ICD-10-CM | POA: Diagnosis not present

## 2023-03-20 DIAGNOSIS — I130 Hypertensive heart and chronic kidney disease with heart failure and stage 1 through stage 4 chronic kidney disease, or unspecified chronic kidney disease: Secondary | ICD-10-CM | POA: Diagnosis not present

## 2023-03-20 DIAGNOSIS — G4733 Obstructive sleep apnea (adult) (pediatric): Secondary | ICD-10-CM | POA: Diagnosis not present

## 2023-03-20 DIAGNOSIS — I5042 Chronic combined systolic (congestive) and diastolic (congestive) heart failure: Secondary | ICD-10-CM | POA: Diagnosis not present

## 2023-03-20 DIAGNOSIS — N1832 Chronic kidney disease, stage 3b: Secondary | ICD-10-CM | POA: Diagnosis not present

## 2023-03-20 DIAGNOSIS — J45901 Unspecified asthma with (acute) exacerbation: Secondary | ICD-10-CM | POA: Diagnosis not present

## 2023-03-20 NOTE — Assessment & Plan Note (Addendum)
Patient tachycardic experiencing shortness of breath on 3 L of oxygen and dizziness with ambulation. Given patient symptoms in the setting of recent left leg wound would need further evaluation to rule out sepsis.  EKG sinus tachycardia with nonspecific ST changes. Patient transported to ER via EMS for further evaluation.

## 2023-03-20 NOTE — Assessment & Plan Note (Signed)
She has completed clindamycin for 7 days.  Serous discharge.  Erythema surrounding the wound.  Questionable cellulitis.

## 2023-03-22 ENCOUNTER — Ambulatory Visit: Payer: Self-pay | Admitting: Physician Assistant

## 2023-03-22 NOTE — Telephone Encounter (Signed)
Per previous message pt was requesting Theraflu, But refused to come in.

## 2023-03-22 NOTE — Telephone Encounter (Signed)
Cellulitis Clinical Indications for Admission to Inpatient Care is what the referral shows on it. I do not know who to contact regarding this.

## 2023-03-22 NOTE — Telephone Encounter (Signed)
Increase in status regarding home health order for the patient.

## 2023-03-22 NOTE — Telephone Encounter (Signed)
Please disregard previous message, can you please check status regarding home health order for the patient.

## 2023-03-23 ENCOUNTER — Other Ambulatory Visit: Payer: Self-pay

## 2023-03-23 ENCOUNTER — Inpatient Hospital Stay
Admission: EM | Admit: 2023-03-23 | Discharge: 2023-04-03 | DRG: 871 | Disposition: E | Payer: Medicare PPO | Attending: Internal Medicine | Admitting: Internal Medicine

## 2023-03-23 ENCOUNTER — Emergency Department: Payer: Medicare PPO

## 2023-03-23 ENCOUNTER — Telehealth: Payer: Self-pay | Admitting: Physician Assistant

## 2023-03-23 DIAGNOSIS — I5031 Acute diastolic (congestive) heart failure: Secondary | ICD-10-CM | POA: Diagnosis not present

## 2023-03-23 DIAGNOSIS — J101 Influenza due to other identified influenza virus with other respiratory manifestations: Secondary | ICD-10-CM | POA: Diagnosis not present

## 2023-03-23 DIAGNOSIS — R6520 Severe sepsis without septic shock: Secondary | ICD-10-CM | POA: Diagnosis not present

## 2023-03-23 DIAGNOSIS — E876 Hypokalemia: Secondary | ICD-10-CM | POA: Diagnosis not present

## 2023-03-23 DIAGNOSIS — G4733 Obstructive sleep apnea (adult) (pediatric): Secondary | ICD-10-CM | POA: Diagnosis present

## 2023-03-23 DIAGNOSIS — E860 Dehydration: Secondary | ICD-10-CM | POA: Diagnosis present

## 2023-03-23 DIAGNOSIS — Z604 Social exclusion and rejection: Secondary | ICD-10-CM | POA: Diagnosis present

## 2023-03-23 DIAGNOSIS — M810 Age-related osteoporosis without current pathological fracture: Secondary | ICD-10-CM | POA: Diagnosis present

## 2023-03-23 DIAGNOSIS — I447 Left bundle-branch block, unspecified: Secondary | ICD-10-CM | POA: Diagnosis not present

## 2023-03-23 DIAGNOSIS — I2510 Atherosclerotic heart disease of native coronary artery without angina pectoris: Secondary | ICD-10-CM | POA: Diagnosis present

## 2023-03-23 DIAGNOSIS — Z981 Arthrodesis status: Secondary | ICD-10-CM

## 2023-03-23 DIAGNOSIS — Z96611 Presence of right artificial shoulder joint: Secondary | ICD-10-CM | POA: Diagnosis not present

## 2023-03-23 DIAGNOSIS — Z825 Family history of asthma and other chronic lower respiratory diseases: Secondary | ICD-10-CM

## 2023-03-23 DIAGNOSIS — I130 Hypertensive heart and chronic kidney disease with heart failure and stage 1 through stage 4 chronic kidney disease, or unspecified chronic kidney disease: Secondary | ICD-10-CM | POA: Diagnosis present

## 2023-03-23 DIAGNOSIS — Z9981 Dependence on supplemental oxygen: Secondary | ICD-10-CM | POA: Diagnosis not present

## 2023-03-23 DIAGNOSIS — Z8261 Family history of arthritis: Secondary | ICD-10-CM

## 2023-03-23 DIAGNOSIS — H3530 Unspecified macular degeneration: Secondary | ICD-10-CM | POA: Diagnosis present

## 2023-03-23 DIAGNOSIS — Z800 Family history of malignant neoplasm of digestive organs: Secondary | ICD-10-CM

## 2023-03-23 DIAGNOSIS — Z811 Family history of alcohol abuse and dependence: Secondary | ICD-10-CM

## 2023-03-23 DIAGNOSIS — Z887 Allergy status to serum and vaccine status: Secondary | ICD-10-CM

## 2023-03-23 DIAGNOSIS — M069 Rheumatoid arthritis, unspecified: Secondary | ICD-10-CM | POA: Diagnosis present

## 2023-03-23 DIAGNOSIS — J90 Pleural effusion, not elsewhere classified: Secondary | ICD-10-CM | POA: Diagnosis not present

## 2023-03-23 DIAGNOSIS — I5033 Acute on chronic diastolic (congestive) heart failure: Secondary | ICD-10-CM | POA: Diagnosis not present

## 2023-03-23 DIAGNOSIS — E669 Obesity, unspecified: Secondary | ICD-10-CM | POA: Diagnosis present

## 2023-03-23 DIAGNOSIS — J9601 Acute respiratory failure with hypoxia: Secondary | ICD-10-CM

## 2023-03-23 DIAGNOSIS — Z882 Allergy status to sulfonamides status: Secondary | ICD-10-CM

## 2023-03-23 DIAGNOSIS — Z833 Family history of diabetes mellitus: Secondary | ICD-10-CM

## 2023-03-23 DIAGNOSIS — A419 Sepsis, unspecified organism: Principal | ICD-10-CM | POA: Diagnosis present

## 2023-03-23 DIAGNOSIS — R0902 Hypoxemia: Secondary | ICD-10-CM | POA: Diagnosis not present

## 2023-03-23 DIAGNOSIS — Z886 Allergy status to analgesic agent status: Secondary | ICD-10-CM

## 2023-03-23 DIAGNOSIS — G9341 Metabolic encephalopathy: Secondary | ICD-10-CM | POA: Diagnosis not present

## 2023-03-23 DIAGNOSIS — L03116 Cellulitis of left lower limb: Secondary | ICD-10-CM | POA: Diagnosis present

## 2023-03-23 DIAGNOSIS — J189 Pneumonia, unspecified organism: Secondary | ICD-10-CM | POA: Diagnosis not present

## 2023-03-23 DIAGNOSIS — Z881 Allergy status to other antibiotic agents status: Secondary | ICD-10-CM

## 2023-03-23 DIAGNOSIS — R509 Fever, unspecified: Secondary | ICD-10-CM | POA: Diagnosis not present

## 2023-03-23 DIAGNOSIS — R918 Other nonspecific abnormal finding of lung field: Secondary | ICD-10-CM | POA: Diagnosis not present

## 2023-03-23 DIAGNOSIS — Z820 Family history of epilepsy and other diseases of the nervous system: Secondary | ICD-10-CM

## 2023-03-23 DIAGNOSIS — J9621 Acute and chronic respiratory failure with hypoxia: Secondary | ICD-10-CM | POA: Diagnosis not present

## 2023-03-23 DIAGNOSIS — Z6835 Body mass index (BMI) 35.0-35.9, adult: Secondary | ICD-10-CM

## 2023-03-23 DIAGNOSIS — J441 Chronic obstructive pulmonary disease with (acute) exacerbation: Secondary | ICD-10-CM | POA: Diagnosis present

## 2023-03-23 DIAGNOSIS — Z66 Do not resuscitate: Secondary | ICD-10-CM | POA: Diagnosis present

## 2023-03-23 DIAGNOSIS — Z7951 Long term (current) use of inhaled steroids: Secondary | ICD-10-CM

## 2023-03-23 DIAGNOSIS — Z96612 Presence of left artificial shoulder joint: Secondary | ICD-10-CM | POA: Diagnosis present

## 2023-03-23 DIAGNOSIS — J188 Other pneumonia, unspecified organism: Secondary | ICD-10-CM

## 2023-03-23 DIAGNOSIS — R8271 Bacteriuria: Secondary | ICD-10-CM | POA: Diagnosis present

## 2023-03-23 DIAGNOSIS — N179 Acute kidney failure, unspecified: Secondary | ICD-10-CM | POA: Diagnosis present

## 2023-03-23 DIAGNOSIS — J13 Pneumonia due to Streptococcus pneumoniae: Secondary | ICD-10-CM | POA: Diagnosis not present

## 2023-03-23 DIAGNOSIS — R0603 Acute respiratory distress: Secondary | ICD-10-CM | POA: Diagnosis not present

## 2023-03-23 DIAGNOSIS — Z818 Family history of other mental and behavioral disorders: Secondary | ICD-10-CM

## 2023-03-23 DIAGNOSIS — J09X2 Influenza due to identified novel influenza A virus with other respiratory manifestations: Secondary | ICD-10-CM | POA: Diagnosis not present

## 2023-03-23 DIAGNOSIS — F319 Bipolar disorder, unspecified: Secondary | ICD-10-CM | POA: Diagnosis present

## 2023-03-23 DIAGNOSIS — I739 Peripheral vascular disease, unspecified: Secondary | ICD-10-CM | POA: Diagnosis present

## 2023-03-23 DIAGNOSIS — R531 Weakness: Secondary | ICD-10-CM | POA: Diagnosis not present

## 2023-03-23 DIAGNOSIS — Z515 Encounter for palliative care: Secondary | ICD-10-CM | POA: Diagnosis not present

## 2023-03-23 DIAGNOSIS — Z1152 Encounter for screening for COVID-19: Secondary | ICD-10-CM | POA: Diagnosis not present

## 2023-03-23 DIAGNOSIS — Z91041 Radiographic dye allergy status: Secondary | ICD-10-CM

## 2023-03-23 DIAGNOSIS — Z809 Family history of malignant neoplasm, unspecified: Secondary | ICD-10-CM

## 2023-03-23 DIAGNOSIS — Z803 Family history of malignant neoplasm of breast: Secondary | ICD-10-CM

## 2023-03-23 DIAGNOSIS — F419 Anxiety disorder, unspecified: Secondary | ICD-10-CM | POA: Diagnosis present

## 2023-03-23 DIAGNOSIS — Z905 Acquired absence of kidney: Secondary | ICD-10-CM

## 2023-03-23 DIAGNOSIS — Z807 Family history of other malignant neoplasms of lymphoid, hematopoietic and related tissues: Secondary | ICD-10-CM

## 2023-03-23 DIAGNOSIS — J440 Chronic obstructive pulmonary disease with acute lower respiratory infection: Secondary | ICD-10-CM | POA: Diagnosis present

## 2023-03-23 DIAGNOSIS — Z885 Allergy status to narcotic agent status: Secondary | ICD-10-CM

## 2023-03-23 DIAGNOSIS — N1832 Chronic kidney disease, stage 3b: Secondary | ICD-10-CM | POA: Diagnosis present

## 2023-03-23 DIAGNOSIS — R0600 Dyspnea, unspecified: Secondary | ICD-10-CM | POA: Diagnosis not present

## 2023-03-23 DIAGNOSIS — E872 Acidosis, unspecified: Secondary | ICD-10-CM | POA: Diagnosis present

## 2023-03-23 DIAGNOSIS — Z888 Allergy status to other drugs, medicaments and biological substances status: Secondary | ICD-10-CM

## 2023-03-23 DIAGNOSIS — R0689 Other abnormalities of breathing: Secondary | ICD-10-CM | POA: Diagnosis not present

## 2023-03-23 DIAGNOSIS — I482 Chronic atrial fibrillation, unspecified: Secondary | ICD-10-CM | POA: Diagnosis present

## 2023-03-23 DIAGNOSIS — Z8249 Family history of ischemic heart disease and other diseases of the circulatory system: Secondary | ICD-10-CM

## 2023-03-23 DIAGNOSIS — S2243XA Multiple fractures of ribs, bilateral, initial encounter for closed fracture: Secondary | ICD-10-CM | POA: Diagnosis not present

## 2023-03-23 DIAGNOSIS — Z7189 Other specified counseling: Secondary | ICD-10-CM | POA: Diagnosis not present

## 2023-03-23 DIAGNOSIS — Z96643 Presence of artificial hip joint, bilateral: Secondary | ICD-10-CM | POA: Diagnosis present

## 2023-03-23 DIAGNOSIS — R079 Chest pain, unspecified: Secondary | ICD-10-CM | POA: Diagnosis not present

## 2023-03-23 DIAGNOSIS — E785 Hyperlipidemia, unspecified: Secondary | ICD-10-CM | POA: Diagnosis present

## 2023-03-23 DIAGNOSIS — Z8701 Personal history of pneumonia (recurrent): Secondary | ICD-10-CM

## 2023-03-23 DIAGNOSIS — R0602 Shortness of breath: Secondary | ICD-10-CM | POA: Diagnosis not present

## 2023-03-23 DIAGNOSIS — J1008 Influenza due to other identified influenza virus with other specified pneumonia: Secondary | ICD-10-CM | POA: Diagnosis not present

## 2023-03-23 DIAGNOSIS — M7989 Other specified soft tissue disorders: Secondary | ICD-10-CM | POA: Diagnosis not present

## 2023-03-23 DIAGNOSIS — Z96651 Presence of right artificial knee joint: Secondary | ICD-10-CM | POA: Diagnosis present

## 2023-03-23 DIAGNOSIS — Z91048 Other nonmedicinal substance allergy status: Secondary | ICD-10-CM

## 2023-03-23 DIAGNOSIS — Z823 Family history of stroke: Secondary | ICD-10-CM

## 2023-03-23 DIAGNOSIS — R000 Tachycardia, unspecified: Secondary | ICD-10-CM | POA: Diagnosis not present

## 2023-03-23 DIAGNOSIS — Z87891 Personal history of nicotine dependence: Secondary | ICD-10-CM

## 2023-03-23 DIAGNOSIS — D631 Anemia in chronic kidney disease: Secondary | ICD-10-CM | POA: Diagnosis present

## 2023-03-23 DIAGNOSIS — Z7901 Long term (current) use of anticoagulants: Secondary | ICD-10-CM

## 2023-03-23 DIAGNOSIS — Z801 Family history of malignant neoplasm of trachea, bronchus and lung: Secondary | ICD-10-CM

## 2023-03-23 DIAGNOSIS — K219 Gastro-esophageal reflux disease without esophagitis: Secondary | ICD-10-CM | POA: Diagnosis present

## 2023-03-23 DIAGNOSIS — I4891 Unspecified atrial fibrillation: Secondary | ICD-10-CM | POA: Diagnosis not present

## 2023-03-23 DIAGNOSIS — Z79899 Other long term (current) drug therapy: Secondary | ICD-10-CM

## 2023-03-23 LAB — URINALYSIS, W/ REFLEX TO CULTURE (INFECTION SUSPECTED)
Bilirubin Urine: NEGATIVE
Glucose, UA: NEGATIVE mg/dL
Ketones, ur: NEGATIVE mg/dL
Nitrite: NEGATIVE
Protein, ur: 30 mg/dL — AB
Specific Gravity, Urine: 1.019 (ref 1.005–1.030)
pH: 5 (ref 5.0–8.0)

## 2023-03-23 LAB — CBC WITH DIFFERENTIAL/PLATELET
Abs Immature Granulocytes: 0.52 10*3/uL — ABNORMAL HIGH (ref 0.00–0.07)
Basophils Absolute: 0.1 10*3/uL (ref 0.0–0.1)
Basophils Relative: 0 %
Eosinophils Absolute: 0.1 10*3/uL (ref 0.0–0.5)
Eosinophils Relative: 0 %
HCT: 30.3 % — ABNORMAL LOW (ref 36.0–46.0)
Hemoglobin: 9.5 g/dL — ABNORMAL LOW (ref 12.0–15.0)
Immature Granulocytes: 3 %
Lymphocytes Relative: 17 %
Lymphs Abs: 3.5 10*3/uL (ref 0.7–4.0)
MCH: 26.8 pg (ref 26.0–34.0)
MCHC: 31.4 g/dL (ref 30.0–36.0)
MCV: 85.4 fL (ref 80.0–100.0)
Monocytes Absolute: 1.2 10*3/uL — ABNORMAL HIGH (ref 0.1–1.0)
Monocytes Relative: 6 %
Neutro Abs: 15.7 10*3/uL — ABNORMAL HIGH (ref 1.7–7.7)
Neutrophils Relative %: 74 %
Platelets: 254 10*3/uL (ref 150–400)
RBC: 3.55 MIL/uL — ABNORMAL LOW (ref 3.87–5.11)
RDW: 15.5 % (ref 11.5–15.5)
WBC: 21.1 10*3/uL — ABNORMAL HIGH (ref 4.0–10.5)
nRBC: 0.7 % — ABNORMAL HIGH (ref 0.0–0.2)

## 2023-03-23 LAB — COMPREHENSIVE METABOLIC PANEL
ALT: 18 U/L (ref 0–44)
AST: 25 U/L (ref 15–41)
Albumin: 2.8 g/dL — ABNORMAL LOW (ref 3.5–5.0)
Alkaline Phosphatase: 73 U/L (ref 38–126)
Anion gap: 15 (ref 5–15)
BUN: 27 mg/dL — ABNORMAL HIGH (ref 8–23)
CO2: 25 mmol/L (ref 22–32)
Calcium: 8.2 mg/dL — ABNORMAL LOW (ref 8.9–10.3)
Chloride: 96 mmol/L — ABNORMAL LOW (ref 98–111)
Creatinine, Ser: 1.74 mg/dL — ABNORMAL HIGH (ref 0.44–1.00)
GFR, Estimated: 29 mL/min — ABNORMAL LOW (ref >60)
Glucose, Bld: 158 mg/dL — ABNORMAL HIGH (ref 70–99)
Potassium: 2.8 mmol/L — ABNORMAL LOW (ref 3.5–5.1)
Sodium: 136 mmol/L (ref 135–145)
Total Bilirubin: 1 mg/dL (ref 0.0–1.2)
Total Protein: 7.2 g/dL (ref 6.5–8.1)

## 2023-03-23 LAB — APTT: aPTT: 42 s — ABNORMAL HIGH (ref 24–36)

## 2023-03-23 LAB — EXPECTORATED SPUTUM ASSESSMENT W GRAM STAIN, RFLX TO RESP C

## 2023-03-23 LAB — PROCALCITONIN: Procalcitonin: 0.76 ng/mL

## 2023-03-23 LAB — LACTIC ACID, PLASMA
Lactic Acid, Venous: 2.1 mmol/L (ref 0.5–1.9)
Lactic Acid, Venous: 2.1 mmol/L (ref 0.5–1.9)

## 2023-03-23 LAB — RESP PANEL BY RT-PCR (RSV, FLU A&B, COVID)  RVPGX2
Influenza A by PCR: POSITIVE — AB
Influenza B by PCR: NEGATIVE
Resp Syncytial Virus by PCR: NEGATIVE
SARS Coronavirus 2 by RT PCR: NEGATIVE

## 2023-03-23 LAB — PROTIME-INR
INR: 1.6 — ABNORMAL HIGH (ref 0.8–1.2)
Prothrombin Time: 19.2 s — ABNORMAL HIGH (ref 11.4–15.2)

## 2023-03-23 MED ORDER — OXYCODONE-ACETAMINOPHEN 5-325 MG PO TABS
1.0000 | ORAL_TABLET | Freq: Four times a day (QID) | ORAL | Status: DC | PRN
  Administered 2023-03-24 – 2023-03-25 (×2): 1 via ORAL
  Filled 2023-03-23 (×3): qty 1

## 2023-03-23 MED ORDER — BUSPIRONE HCL 10 MG PO TABS
5.0000 mg | ORAL_TABLET | Freq: Two times a day (BID) | ORAL | Status: DC
  Administered 2023-03-23 – 2023-03-25 (×5): 5 mg via ORAL
  Filled 2023-03-23 (×5): qty 1

## 2023-03-23 MED ORDER — LEFLUNOMIDE 10 MG PO TABS
20.0000 mg | ORAL_TABLET | Freq: Every day | ORAL | Status: DC
  Administered 2023-03-24 – 2023-03-25 (×2): 20 mg via ORAL
  Filled 2023-03-23: qty 2
  Filled 2023-03-23: qty 1
  Filled 2023-03-23: qty 2

## 2023-03-23 MED ORDER — AMLODIPINE BESYLATE 5 MG PO TABS
5.0000 mg | ORAL_TABLET | Freq: Two times a day (BID) | ORAL | Status: DC
  Administered 2023-03-24 – 2023-03-25 (×4): 5 mg via ORAL
  Filled 2023-03-23 (×4): qty 1

## 2023-03-23 MED ORDER — ONDANSETRON HCL 4 MG/2ML IJ SOLN
4.0000 mg | Freq: Four times a day (QID) | INTRAMUSCULAR | Status: DC | PRN
  Filled 2023-03-23: qty 2

## 2023-03-23 MED ORDER — ENSURE ENLIVE PO LIQD: 237.0000 mL | Freq: Two times a day (BID) | ORAL | Status: DC

## 2023-03-23 MED ORDER — LAMOTRIGINE 25 MG PO TABS
100.0000 mg | ORAL_TABLET | Freq: Two times a day (BID) | ORAL | Status: DC
  Administered 2023-03-23 – 2023-03-25 (×5): 100 mg via ORAL
  Filled 2023-03-23: qty 4
  Filled 2023-03-23 (×4): qty 1

## 2023-03-23 MED ORDER — MONTELUKAST SODIUM 10 MG PO TABS
10.0000 mg | ORAL_TABLET | Freq: Every day | ORAL | Status: DC
Start: 2023-03-23 — End: 2023-03-26
  Administered 2023-03-23 – 2023-03-25 (×3): 10 mg via ORAL
  Filled 2023-03-23 (×3): qty 1

## 2023-03-23 MED ORDER — ESCITALOPRAM OXALATE 20 MG PO TABS
10.0000 mg | ORAL_TABLET | Freq: Every day | ORAL | Status: DC
  Administered 2023-03-24 – 2023-03-25 (×2): 10 mg via ORAL
  Filled 2023-03-23 (×2): qty 1

## 2023-03-23 MED ORDER — IPRATROPIUM-ALBUTEROL 0.5-2.5 (3) MG/3ML IN SOLN
3.0000 mL | Freq: Four times a day (QID) | RESPIRATORY_TRACT | Status: DC | PRN
  Administered 2023-03-24: 3 mL via RESPIRATORY_TRACT
  Filled 2023-03-23: qty 3

## 2023-03-23 MED ORDER — PRAVASTATIN SODIUM 20 MG PO TABS
20.0000 mg | ORAL_TABLET | Freq: Every day | ORAL | Status: DC
  Administered 2023-03-25: 20 mg via ORAL
  Filled 2023-03-23 (×2): qty 1

## 2023-03-23 MED ORDER — GERHARDT'S BUTT CREAM
TOPICAL_CREAM | Freq: Three times a day (TID) | CUTANEOUS | Status: DC
  Filled 2023-03-23: qty 60

## 2023-03-23 MED ORDER — POTASSIUM CHLORIDE 20 MEQ PO PACK
40.0000 meq | PACK | Freq: Once | ORAL | Status: AC
Start: 2023-03-23 — End: 2023-03-23
  Administered 2023-03-23: 40 meq via ORAL
  Filled 2023-03-23: qty 2

## 2023-03-23 MED ORDER — LACTATED RINGERS IV SOLN: INTRAVENOUS | Status: DC

## 2023-03-23 MED ORDER — UMECLIDINIUM BROMIDE 62.5 MCG/ACT IN AEPB
1.0000 | INHALATION_SPRAY | Freq: Every day | RESPIRATORY_TRACT | Status: DC
  Administered 2023-03-24: 1 via RESPIRATORY_TRACT
  Filled 2023-03-23 (×2): qty 7

## 2023-03-23 MED ORDER — SODIUM CHLORIDE 0.9 % IV SOLN
500.0000 mg | INTRAVENOUS | Status: DC
  Administered 2023-03-23 – 2023-03-25 (×3): 500 mg via INTRAVENOUS
  Filled 2023-03-23 (×3): qty 5

## 2023-03-23 MED ORDER — APIXABAN 2.5 MG PO TABS
2.5000 mg | ORAL_TABLET | Freq: Two times a day (BID) | ORAL | Status: DC
  Administered 2023-03-24: 2.5 mg via ORAL
  Filled 2023-03-23 (×2): qty 1

## 2023-03-23 MED ORDER — POTASSIUM CHLORIDE 10 MEQ/100ML IV SOLN
10.0000 meq | INTRAVENOUS | Status: AC
  Administered 2023-03-23 (×2): 10 meq via INTRAVENOUS
  Filled 2023-03-23 (×2): qty 100

## 2023-03-23 MED ORDER — ACETAMINOPHEN 325 MG PO TABS
650.0000 mg | ORAL_TABLET | Freq: Four times a day (QID) | ORAL | Status: DC | PRN
  Administered 2023-03-23: 650 mg via ORAL
  Filled 2023-03-23: qty 2

## 2023-03-23 MED ORDER — ACETAMINOPHEN 650 MG RE SUPP: 650.0000 mg | Freq: Four times a day (QID) | RECTAL | Status: DC | PRN

## 2023-03-23 MED ORDER — ONDANSETRON HCL 4 MG PO TABS: 4.0000 mg | ORAL_TABLET | Freq: Four times a day (QID) | ORAL | Status: DC | PRN

## 2023-03-23 MED ORDER — FLUTICASONE FUROATE-VILANTEROL 100-25 MCG/ACT IN AEPB
1.0000 | INHALATION_SPRAY | Freq: Every day | RESPIRATORY_TRACT | Status: DC
  Filled 2023-03-23 (×2): qty 28

## 2023-03-23 MED ORDER — LACTATED RINGERS IV BOLUS (SEPSIS)
1000.0000 mL | Freq: Once | INTRAVENOUS | Status: AC
  Administered 2023-03-23: 1000 mL via INTRAVENOUS

## 2023-03-23 MED ORDER — NEBIVOLOL HCL 5 MG PO TABS
5.0000 mg | ORAL_TABLET | Freq: Two times a day (BID) | ORAL | Status: DC
  Administered 2023-03-23 – 2023-03-25 (×4): 5 mg via ORAL
  Filled 2023-03-23 (×5): qty 1

## 2023-03-23 MED ORDER — SODIUM CHLORIDE 0.9 % IV SOLN
2.0000 g | Freq: Once | INTRAVENOUS | Status: AC
  Administered 2023-03-23: 2 g via INTRAVENOUS
  Filled 2023-03-23: qty 10

## 2023-03-23 MED ORDER — PROSIGHT PO TABS
1.0000 | ORAL_TABLET | Freq: Every day | ORAL | Status: DC
  Administered 2023-03-24 – 2023-03-25 (×2): 1 via ORAL
  Filled 2023-03-23 (×2): qty 1

## 2023-03-23 MED ORDER — POTASSIUM CHLORIDE CRYS ER 20 MEQ PO TBCR
40.0000 meq | EXTENDED_RELEASE_TABLET | Freq: Once | ORAL | Status: AC
  Administered 2023-03-23: 40 meq via ORAL
  Filled 2023-03-23: qty 2

## 2023-03-23 MED ORDER — VANCOMYCIN HCL IN DEXTROSE 1-5 GM/200ML-% IV SOLN
1000.0000 mg | Freq: Once | INTRAVENOUS | Status: AC
  Administered 2023-03-23: 1000 mg via INTRAVENOUS
  Filled 2023-03-23: qty 200

## 2023-03-23 MED ORDER — SENNOSIDES-DOCUSATE SODIUM 8.6-50 MG PO TABS: 1.0000 | ORAL_TABLET | Freq: Every evening | ORAL | Status: DC | PRN

## 2023-03-23 MED ORDER — ENSURE ENLIVE PO LIQD
237.0000 mL | Freq: Two times a day (BID) | ORAL | Status: DC
  Administered 2023-03-24 – 2023-03-25 (×3): 237 mL via ORAL

## 2023-03-23 NOTE — Consult Note (Signed)
CODE SEPSIS - PHARMACY COMMUNICATION  **Broad Spectrum Antibiotics should be administered within 1 hour of Sepsis diagnosis**  Time Code Sepsis Called/Page Received: 1609  Antibiotics Ordered: aztreonam + vancomycin   Time of 1st antibiotic administration: 1628  Additional action taken by pharmacy: None       Ronnald Ramp ,PharmD Clinical Pharmacist  03/23/2023  4:46 PM

## 2023-03-23 NOTE — Progress Notes (Signed)
Patient states that she does not use a CPAP at home and does not wish to use one while she is here.  Patient currently on 6L [place]. O2 sats 96%

## 2023-03-23 NOTE — ED Provider Notes (Signed)
[facility] Provider Note   Event Date/Time   First MD Initiated Contact with Patient 03/23/23 1554     (approximate) History  Wound Check and Altered Mental Status  HPI Kerri Carter is a 83 y.o. female with a past medical history of CHF, COPD on 2 L chronically, stage III kidney disease, chronic atrial fibrillation on Eliquis, peripheral arterial disease, and past C. difficile who presents complaining of worsening pain and purulent drainage from a left ankle wound.  Patient's son at bedside and provides additional history stating that she was admitted with a discharge on 03/13/2023 after being treated for cellulitis of this left ankle wound.  According to son, patient was sent home with 2 days of antibiotics and since that time this wound has been getting worse.  Son also noted patient to have episodes of altered mental status over the last 48 hours including episodes of confusion.  Additionally patient has been having significant generalized weakness. ROS: Patient currently denies any vision changes, tinnitus, difficulty speaking, facial droop, sore throat, chest pain, shortness of breath, abdominal pain, nausea/vomiting/diarrhea, dysuria, or numbness/paresthesias in any extremity   Physical Exam  Triage Vital Signs: ED Triage Vitals  Encounter Vitals Group     BP 03/23/23 1355 121/77     Systolic BP Percentile --      Diastolic BP Percentile --      Pulse Rate 03/23/23 1355 (!) 116     Resp 03/23/23 1409 20     Temp 03/23/23 1355 99.9 F (37.7 C)     Temp Source 03/23/23 1355 Oral     SpO2 03/23/23 1355 95 %     Weight 03/23/23 1357 175 lb (79.4 kg)     Height 03/23/23 1357 4\' 11"  (1.499 m)     Head Circumference --      Peak Flow --      Pain Score 03/23/23 1353 5     Pain Loc --      Pain Education --      Exclude from Growth Chart --    Most recent vital signs: Vitals:   03/23/23 2216 03/23/23 2222  BP:    Pulse: (!) 118 (!) 121  Resp: (!) 27 (!)  21  Temp:    SpO2: 96% 95%   General: Awake, oriented x4. CV:  Good peripheral perfusion.  Resp:  Increased effort.  Rales and rhonchi appreciated over bilateral lung fields Abd:  No distention.  Other:  Elderly obese Caucasian female resting comfortably in no acute distress.  Sutures in place to the left anterior lateral proximal ankle with purulent drainage appreciated from the superior portion of the anterior wound ED Results / Procedures / Treatments  Labs (all labs ordered are listed, but only abnormal results are displayed) Labs Reviewed  RESP PANEL BY RT-PCR (RSV, FLU A&B, COVID)  RVPGX2 - Abnormal; Notable for the following components:      Result Value   Influenza A by PCR POSITIVE (*)    All other components within normal limits  COMPREHENSIVE METABOLIC PANEL - Abnormal; Notable for the following components:   Potassium 2.8 (*)    Chloride 96 (*)    Glucose, Bld 158 (*)    BUN 27 (*)    Creatinine, Ser 1.74 (*)    Calcium 8.2 (*)    Albumin 2.8 (*)    GFR, Estimated 29 (*)    All other components within normal limits  LACTIC ACID, PLASMA - Abnormal; Notable  for the following components:   Lactic Acid, Venous 2.1 (*)    All other components within normal limits  LACTIC ACID, PLASMA - Abnormal; Notable for the following components:   Lactic Acid, Venous 2.1 (*)    All other components within normal limits  CBC WITH DIFFERENTIAL/PLATELET - Abnormal; Notable for the following components:   WBC 21.1 (*)    RBC 3.55 (*)    Hemoglobin 9.5 (*)    HCT 30.3 (*)    nRBC 0.7 (*)    Neutro Abs 15.7 (*)    Monocytes Absolute 1.2 (*)    Abs Immature Granulocytes 0.52 (*)    All other components within normal limits  PROTIME-INR - Abnormal; Notable for the following components:   Prothrombin Time 19.2 (*)    INR 1.6 (*)    All other components within normal limits  URINALYSIS, W/ REFLEX TO CULTURE (INFECTION SUSPECTED) - Abnormal; Notable for the following components:    Color, Urine AMBER (*)    APPearance CLOUDY (*)    Hgb urine dipstick MODERATE (*)    Protein, ur 30 (*)    Leukocytes,Ua LARGE (*)    Bacteria, UA FEW (*)    All other components within normal limits  APTT - Abnormal; Notable for the following components:   aPTT 42 (*)    All other components within normal limits  EXPECTORATED SPUTUM ASSESSMENT W GRAM STAIN, RFLX TO RESP C  CULTURE, BLOOD (ROUTINE X 2)  CULTURE, BLOOD (ROUTINE X 2)  SURGICAL PCR SCREEN  URINE CULTURE  CULTURE, RESPIRATORY W GRAM STAIN  PROCALCITONIN  STREP PNEUMONIAE URINARY ANTIGEN  RENAL FUNCTION PANEL  CBC  MAGNESIUM  LEGIONELLA PNEUMOPHILA SEROGP 1 UR AG   EKG ED ECG REPORT I, Merwyn Katos, the attending physician, personally viewed and interpreted this ECG. Date: 03/23/2023 EKG Time: 1707 Rate: 117 Rhythm: Tachycardic sinus rhythm QRS Axis: normal Intervals: Left bundle branch block ST/T Wave abnormalities: normal Narrative Interpretation: Tachycardic sinus rhythm with left bundle branch block.  No evidence of acute ischemia RADIOLOGY ED MD interpretation: 2 view x-ray of the ankle shows anterior soft tissue edema without any radiographic evidence of osteomyelitis  2 view chest x-ray shows multifocal pneumonia -Agree with radiology assessment Official radiology report(s): DG Ankle Complete Left Result Date: 03/23/2023 CLINICAL DATA:  Wound infection. Purulence drainage from anterior ankle. EXAM: LEFT ANKLE COMPLETE - 3+ VIEW COMPARISON:  Tibia/fibula radiograph 02/24/2023 FINDINGS: There is no evidence of fracture, dislocation, or joint effusion. Ankle mortise is preserved. No erosion or periostitis. Anterior soft tissue edema. No radiopaque foreign body or soft tissue gas. IMPRESSION: Anterior soft tissue edema. No radiographic evidence of osteomyelitis. Electronically Signed   By: Narda Rutherford M.D.   On: 03/23/2023 18:27   DG Chest 2 View Result Date: 03/23/2023 CLINICAL DATA:  Suspected  Sepsis Pain. EXAM: CHEST - 2 VIEW COMPARISON:  Chest radiograph 02/11/2023 FINDINGS: Patient's chin obscures the apex. Stable heart size and mediastinal contours, mild cardiomegaly. Multifocal lung opacities, greatest in the bases, suspicious for pneumonia. No pneumothorax or large pleural effusion. Exaggerated thoracic kyphosis. Bilateral reverse shoulder arthroplasties. Remote bilateral rib fractures. IMPRESSION: Patchy multifocal lung opacities, greatest in the bases, suspicious for multifocal pneumonia. Electronically Signed   By: Narda Rutherford M.D.   On: 03/23/2023 16:34   PROCEDURES: Critical Care performed: Yes, see critical care procedure note(s) .1-3 Lead EKG Interpretation  Performed by: Merwyn Katos, MD Authorized by: Merwyn Katos, MD     Interpretation:  abnormal     ECG rate:  111   ECG rate assessment: tachycardic     Rhythm: sinus tachycardia     Ectopy: none     Conduction: normal   CRITICAL CARE Performed by: Merwyn Katos  Total critical care time: 35 minutes  Critical care time was exclusive of separately billable procedures and treating other patients.  Critical care was necessary to treat or prevent imminent or life-threatening deterioration.  Critical care was time spent personally by me on the following activities: development of treatment plan with patient and/or surrogate as well as nursing, discussions with consultants, evaluation of patient's response to treatment, examination of patient, obtaining history from patient or surrogate, ordering and performing treatments and interventions, ordering and review of laboratory studies, ordering and review of radiographic studies, pulse oximetry and re-evaluation of patient's condition.  MEDICATIONS ORDERED IN ED: Medications  lactated ringers infusion ( Intravenous New Bag/Given 03/23/23 2323)  acetaminophen (TYLENOL) tablet 650 mg (650 mg Oral Given 03/23/23 2318)    Or  acetaminophen (TYLENOL) suppository 650  mg ( Rectal See Alternative 03/23/23 2318)  senna-docusate (Senokot-S) tablet 1 tablet (has no administration in time range)  ondansetron (ZOFRAN) tablet 4 mg (has no administration in time range)    Or  ondansetron (ZOFRAN) injection 4 mg (has no administration in time range)  feeding supplement (ENSURE ENLIVE / ENSURE PLUS) liquid 237 mL (has no administration in time range)  amLODipine (NORVASC) tablet 5 mg (has no administration in time range)  apixaban (ELIQUIS) tablet 2.5 mg (has no administration in time range)  busPIRone (BUSPAR) tablet 5 mg (5 mg Oral Given 03/23/23 2318)  escitalopram (LEXAPRO) tablet 10 mg (has no administration in time range)  fluticasone furoate-vilanterol (BREO ELLIPTA) 100-25 MCG/ACT 1 puff (has no administration in time range)    And  umeclidinium bromide (INCRUSE ELLIPTA) 62.5 MCG/ACT 1 puff (has no administration in time range)  leflunomide (ARAVA) tablet 20 mg (has no administration in time range)  pravastatin (PRAVACHOL) tablet 20 mg (has no administration in time range)  montelukast (SINGULAIR) tablet 10 mg (10 mg Oral Given 03/23/23 2304)  multivitamin (PROSIGHT) tablet 1 tablet (has no administration in time range)  nebivolol (BYSTOLIC) tablet 5 mg (5 mg Oral Given 03/23/23 2305)  lamoTRIgine (LAMICTAL) tablet 100 mg (100 mg Oral Given 03/23/23 2318)  ipratropium-albuterol (DUONEB) 0.5-2.5 (3) MG/3ML nebulizer solution 3 mL (has no administration in time range)  azithromycin (ZITHROMAX) 500 mg in sodium chloride 0.9 % 250 mL IVPB (has no administration in time range)  lactated ringers bolus 1,000 mL (0 mLs Intravenous Stopped 03/23/23 1858)  aztreonam (AZACTAM) 2 g in sodium chloride 0.9 % 100 mL IVPB (0 g Intravenous Stopped 03/23/23 1702)  vancomycin (VANCOCIN) IVPB 1000 mg/200 mL premix (0 mg Intravenous Stopped 03/23/23 1923)  potassium chloride (KLOR-CON) packet 40 mEq (40 mEq Oral Given 03/23/23 1721)  potassium chloride 10 mEq in 100 mL IVPB (10 mEq  Intravenous New Bag/Given 03/23/23 1857)  potassium chloride SA (KLOR-CON M) CR tablet 40 mEq (40 mEq Oral Given 03/23/23 2304)   IMPRESSION / MDM / ASSESSMENT AND PLAN / ED COURSE  I reviewed the triage vital signs and the nursing notes.                             The patient is on the cardiac monitor to evaluate for evidence of arrhythmia and/or significant heart rate changes. Patient's presentation is most  consistent with acute presentation with potential threat to life or bodily function. The Pt presents with left ankle pain and shortness of breath highly concerning for sepsis (suspected pulmonary/skin soft tissue source). At this time, the Pt is satting well on 2 L, normotensive, and appears HDS.  Will start empiric antibiotics and fluids.  Have low suspicion for a GI or CNS source at this time, but will reconsider if initial workup is unremarkable.  - CBC, BMP, LFTs - VBG - UA - BCx x2, Lactate - EKG - CXR - Left ankle x-ray - Empiric Abx: Vancomycin/aztreonam - Fluids: 150 cc/h LR  Dispo: Admit to medicine   FINAL CLINICAL IMPRESSION(S) / ED DIAGNOSES   Final diagnoses:  Influenza A virus present  Multifocal pneumonia  Acute respiratory failure with hypoxia (HCC)  Cellulitis of left ankle   Rx / DC Orders   ED Discharge Orders     None      Note:  This document was prepared using Dragon voice recognition software and may include unintentional dictation errors.   Merwyn Katos, MD 03/23/23 [phone]

## 2023-03-23 NOTE — H&P (Signed)
History and Physical  Kerri Carter:[id number] DOB: 11-06-1940 DOA: 03/23/2023  PCP: Dana Allan, MD   Chief Complaint: Altered mental status, generalized weakness  HPI: Kerri Carter is a 83 y.o. female with medical history significant for ILD and COPD on 2-3 L oxygen, hypertension, hyperlipidemia, PVD, CAD, dCHF, depression with anxiety, bipolar, CKD 3B, A-fib on Eliquis, OSA on CPAP, obesity, who presented via EMS for evaluation of generalized weakness, altered mental status and worsening wound.  Per son, patient was doing well until Sunday she started having significant weakness and some disorientation. On Monday, there was slight improvement in her mental status. Wound care came to check patient's left ankle wound and noticed purulent drainage. Today, patient was found to have a temperature of 102 with worsening weakness so they called EMS.  On their arrival, patient had a temperature of 103. Patient reports worsening cough that have become productive over the last few days. She also endorsed left flank pain, fatigue, chills and poor appetite but denies any nausea, vomiting, abdominal pain, chest pain, dizziness, palpitations, dysuria, or diarrhea. Son reports that multiple family members have the flu at home.     ED Course: Initial vitals show temp 99.9, RR 20, HR 116, BP 121/77, SpO2 95% on 2 L.  Labs significant for sodium 136, K+ 2.8, glucose 158, BUN/creatinine 27/1.74, WBC 21.1, Hgb 9.5, platelet 254, lactic acid 2.1, PT/INR 19.3/1.6, positive influenza A PCR, negative COVID and RSV test. CXR shows evidence of multifocal pneumonia.  Sepsis protocol was activated and patient received IV fluids, IV vancomycin and aztreonam (due to cephalosporin allergy).  She was also given oral KCl 40 mEq x 1 and IV KCl 10 mEq x 2 doses. TRH was consulted for admission  Review of Systems: Please see HPI for pertinent positives and negatives. A complete 10 system review of systems are otherwise  negative.  Past Medical History:  Diagnosis Date   Acromial process of scapula fracture 12/21/2019   Acute kidney injury superimposed on chronic kidney disease (HCC) 05/26/2021   Acute on chronic combined systolic (congestive) and diastolic (congestive) heart failure (HCC) 07/28/2021   Acute on chronic combined systolic and diastolic CHF (congestive heart failure) (HCC) 05/26/2021   Acute on chronic respiratory failure with hypoxia (HCC) 09/10/2021   Acute pain of left knee 12/18/2020   Anemia in stage 3a chronic kidney disease (HCC) 10/09/2021   Anxiety    Anxiety 01/20/2019   Anxiety and depression 05/26/2021   Aortic stenosis 07/09/2015   a.) TTE 07/06/2015: EF 55-60%; mild AS with MPG 13.0 mmHg.   Arrhythmia    atrial fibrillation   Assistance needed for mobility 12/18/2020   Asthma    At high risk for falls 03/16/2019   Avascular necrosis of left shoulder due to adverse effect of steroid therapy (HCC) 01/20/2018   Bipolar disorder (HCC)    C. difficile diarrhea 2010   a.) following ABX course during hospital admission   Carotid atherosclerosis, bilateral    a.) Moderate; < 50% stenosis BILATERAL ICAs.   Cataract    a.) s/p BILATERAL extraction   CHF (congestive heart failure) (HCC)    Chicken pox    Chronic left shoulder pain 08/29/2019   Chronic respiratory failure with hypoxia (HCC) 10/01/2021   CKD (chronic kidney disease), stage III (HCC)    a. s/p R nephrectomy./ aneurysm   Closed fracture of acromial process of scapula 10/28/2018   Closed nondisplaced fracture of proximal phalanx of lesser toe of right foot  Collapsed vertebra, not elsewhere classified, lumbar region, initial encounter for fracture (HCC) 02/21/2019   Community acquired pneumonia 07/22/2021   Complicated grief 03/16/2019   Compression fracture of L4 vertebra (HCC) 02/21/2019   Conversion disorder    Conversion disorder 08/04/2015   COPD (chronic obstructive pulmonary disease) (HCC)    COPD with  exacerbation (HCC) 08/20/2021   Cough 05/02/2012   Depression    Dislocation of hip joint prosthesis (HCC) 01/08/2017   Elevated brain natriuretic peptide (BNP) level 12/18/2020   Elevated d-dimer 05/26/2021   Episodic mood disorder (HCC) 04/18/2019   Essential hypertension    Fatigue 07/07/2018   Frequent falls 12/18/2020   GERD (gastroesophageal reflux disease)    H/O cardiac catheterization 02/24/2011   Formatting of this note might be different from the original.  Repeated at [facility] 2/13 and insignificant disease   Hav (hallux abducto valgus), unspecified laterality 12/05/2018   Heart murmur    Hip fracture (HCC) 10/31/2016   Hip pain 12/18/2020   History of fracture of left hip 01/15/2017   History of syncope 08/25/2016   Hyperkalemia 05/26/2021   - The patient was given p.o. Lokelma and IV Lasix.  - We will follow potassium level.   Hyperlipidemia    Hypokalemia 08/25/2016   ILD (interstitial lung disease) (HCC)    mild; 2/2 RA diagnosis   Iliopsoas bursitis of right hip 08/29/2019   Inflammatory arthritis    a. hands/carpal tunnel.  b. Low titer rheumatoid factor. c. Negative anti-CCP antibodies. d. Plaquenil.   Insomnia 07/17/2015   Intractable nausea and vomiting 08/10/2022   Left foot pain 10/19/2016   Left lower lobe pneumonia 05/26/2021   Leg edema 05/02/2019   Leg pain, bilateral    Leg swelling 12/15/2016   Leukocytosis 10/19/2017   Leukopenia 06/24/2021   Lymphocytosis 07/13/2021   Macular degeneration    Microcytic anemia 08/15/2015   Mixed bipolar I disorder (HCC) 10/09/2015   Nocturnal hypoxemia    Nocturnal hypoxemia 09/09/2011   June 2013 overnight oximetry on 6 centimeters of water CPAP: SpO2 less than 88% 72% of the time   Non-Obstructive CAD    a. 07/2009 Cath ([facility]): nonobs dzs;  b. 03/2011 Cath [facility]): nonobs dzs.   NSIP (nonspecific interstitial pneumonitis) (HCC) 12/08/2019   Osteoarthritis    a. Knees.   Other shoulder lesions, left  shoulder 12/20/2017   Other specified postprocedural states 07/20/2013   Overweight (BMI 25.0-29.9) 08/29/2019   PAD (peripheral artery disease) (HCC)    Pain due to onychomycosis of toenails of both feet 12/05/2018   Pelvic mass in female 03/16/2019   02/21/19 CT pelvis w/o contrast  Cystic left adnexal lesion has enlarged since 2018 where it  measured approximately 1.8 x 1.7 cm. This is not well assessed due  to streak artifact as well as lack of contrast. Ultrasound follow-up  may be helpful in 8-12 weeks      04/05/19 TVUS     IMPRESSION:  Simple appearing cystic structure within the left ovary measuring up  to 2.9 cm. While this has enlarged s   Physical deconditioning 02/27/2021   Pleural effusion on left 12/18/2020   PUD (peptic ulcer disease)    Recurrent falls 09/01/2021   Recurrent pneumonia 07/23/2021   Repeated falls 12/18/2020   Respiratory failure with hypoxia (HCC) 01/12/2021   Rotator cuff tendinitis, right 07/26/2017   S/P lumbar fusion 05/23/2021   S/P right hip fracture    11/01/16 s/p repair   Sepsis due to  pneumonia (HCC) 07/22/2021   Shoulder pain    Sleep apnea    no cpap / minimal   SOB (shortness of breath) 10/13/2011   2011 [facility] Pulmonary eval >> LHC (non-obstructive CAD), RHC (wedge 13, mean PA 26), TTE (LVEF > 55%, mild concentric LVH, mild AR, MR, PR, TR), CTAngio chest unrevealing; "well controlled asthma"  02/2011 TTE [facility]>> NL LVEF, Mild LVH, Mod AR/Mod MR   Spinal stenosis at L4-L5 level    severe with L4/L5 anterolisthesis grade 1 anterolisthesis    Spinal stenosis of lumbar region 09/15/2016   Last Assessment & Plan:   Now post op with continued back and leg pain noted. Is working with pt/ot and surgery was said to have no complications. Bowels are moving and general pain and anxiety is basically baseline.    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   Weakness of both lower extremities 12/18/2020   Past Surgical History:  Procedure Laterality Date   APPENDECTOMY     APPLICATION OF INTRAOPERATIVE CT SCAN N/A 05/23/2021   Procedure: APPLICATION OF INTRAOPERATIVE CT SCAN;  Surgeon: Venetia Night, MD;  Location: [facility] ORS;  Service: Neurosurgery;  Laterality: N/A;   BACK SURGERY     lumbar fusion   BUNIONECTOMY Right    CATARACT EXTRACTION, BILATERAL     CESAREAN SECTION     x1   CHOLECYSTECTOMY N/A 05/11/2016   Procedure: LAPAROSCOPIC CHOLECYSTECTOMY;  Surgeon: Lattie Haw, MD;  Location: [facility] ORS;  Service: General;  Laterality: N/A;   COLONOSCOPY WITH PROPOFOL N/A 04/02/2016   Procedure: COLONOSCOPY WITH PROPOFOL;  Surgeon: Wyline Mood, MD;  Location: [facility];  Service: Endoscopy;  Laterality: N/A;   ENDOSCOPIC RETROGRADE CHOLANGIOPANCREATOGRAPHY (ERCP) WITH PROPOFOL N/A 05/08/2016   Procedure: ENDOSCOPIC RETROGRADE CHOLANGIOPANCREATOGRAPHY (ERCP) WITH PROPOFOL;  Surgeon: Midge Minium, MD;  Location: [facility];  Service: Endoscopy;  Laterality: N/A;   ERCP     with biliary spincterotomy 05/08/16 Dr. Servando Snare for choledocholithiasis    ESOPHAGEAL DILATION  04/02/2016   Procedure: ESOPHAGEAL DILATION;  Surgeon: Wyline Mood, MD;  Location: [facility];  Service: Endoscopy;;   ESOPHAGOGASTRODUODENOSCOPY (EGD) WITH PROPOFOL N/A 04/02/2016   Procedure: ESOPHAGOGASTRODUODENOSCOPY (EGD) WITH PROPOFOL;  Surgeon: Wyline Mood, MD;  Location: [facility];  Service: Endoscopy;  Laterality: N/A;   HIP ARTHROPLASTY Right  11/01/2016   Procedure: ARTHROPLASTY BIPOLAR HIP (HEMIARTHROPLASTY);  Surgeon: Christena Flake, MD;  Location: [facility];  Service: Orthopedics;  Laterality: Right;   LUMBAR LAMINECTOMY/DECOMPRESSION MICRODISCECTOMY N/A 12/11/2020   Procedure: LEFT L2-3 MICRODISCECTOMY, L3-4 AND L5-S1 DECOMPRESSION;  Surgeon: Venetia Night, MD;  Location: [facility];  Service: Neurosurgery;  Laterality: N/A;   NEPHRECTOMY  1988   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   ORIF SCAPHOID FRACTURE Left 12/21/2019   Procedure: OPEN REDUCTION INTERNAL FIXATION (ORIF) OF LEFT SCAPULAR NONUNION WITH BONE GRAFT;  Surgeon: Christena Flake, MD;  Location: [facility];  Service: Orthopedics;  Laterality: Left;  osteoporosis     noted DEXA 08/19/16    REPLACEMENT TOTAL KNEE Right    REVERSE SHOULDER ARTHROPLASTY Right 11/04/2017   Procedure: REVERSE SHOULDER ARTHROPLASTY;  Surgeon: Christena Flake, MD;  Location: [facility];  Service: Orthopedics;  Laterality: Right;   REVERSE SHOULDER ARTHROPLASTY Left 07/26/2018   Procedure: REVERSE SHOULDER ARTHROPLASTY;  Surgeon: Christena Flake, MD;  Location: [facility];  Service: Orthopedics;  Laterality: Left;   TONSILLECTOMY     TOTAL HIP ARTHROPLASTY  12/10/11   [facility] left hip   TOTAL HIP ARTHROPLASTY Bilateral    TRANSFORAMINAL LUMBAR INTERBODY FUSION (TLIF) WITH PEDICLE SCREW FIXATION 3 LEVEL N/A 05/23/2021   Procedure: OPEN L2-4 TRANSFORAMINAL LUMBAR INTERBODY FUSION (TLIF) WITH L2-5 POSTERIOR SPINAL FUSION;  Surgeon: Venetia Night, MD;  Location: [facility];  Service: Neurosurgery;  Laterality: N/A;   TUBAL LIGATION     Social History:  reports that she quit smoking about 49 years ago. Her smoking use included cigarettes. She started smoking about 69 years ago. She has a 10 pack-year smoking history. She has never used smokeless tobacco. She reports that she does not drink alcohol and does not use drugs.  Allergies  Allergen Reactions   Ceftin [Cefuroxime Axetil]  Anaphylaxis   Lisinopril Anaphylaxis   Sulfa Antibiotics Other (See Comments)    Face swelling   Sulfasalazine Anaphylaxis   Morphine Other (See Comments)    Per patient, low blood pressure issues that requires action to raise it back up. Can take small infrequent doses   Xarelto [Rivaroxaban] Other (See Comments)    Stomach burning, bleeding, and tar in stool   Adhesive [Tape] Rash    Paper tape and tega derm OK   Antihistamines, Chlorpheniramine-Type Other (See Comments)    Makes pt hyper   Antivert [Meclizine Hcl] Other (See Comments)    Bladder will not empty   Aspirin Other (See Comments)    Sulfasalazine allergy cross reacts   Contrast Media [Iodinated Contrast Media] Rash    she is able to use betadine scrubs.   Decongestant [Pseudoephedrine Hcl] Other (See Comments)    Makes pt hyper   Doxycycline Other (See Comments)    GI upset   Levaquin [Levofloxacin In D5w] Rash   Polymyxin B Other (See Comments)    Facial rash   Tetanus Toxoids Rash and Other (See Comments)    Fever and hot to touch at injection site    Family History  Problem Relation Age of Onset   Rheum arthritis Mother    Asthma Mother    Parkinson's disease Mother    Heart disease Mother    Stroke Mother    Hypertension Mother    Heart attack Father    Heart disease Father    Hypertension Father    Peripheral Artery Disease Father    Diabetes Son    Gout Son    Asthma Sister    Heart disease Sister    Lung cancer Sister    Heart disease Sister    Heart disease Sister    Breast cancer Sister    Heart attack Sister    Heart disease Brother    Heart disease Maternal Grandmother    Diabetes Maternal Grandmother    Colon cancer Maternal Grandmother    Cancer Maternal Grandmother        Hodgkins lymphoma   Heart disease Brother    Alcohol abuse Brother    Depression Brother    Dementia Son  Prior to Admission medications   Medication Sig Start Date End Date Taking? Authorizing Provider   albuterol (PROVENTIL) (2.5 MG/3ML) 0.083% nebulizer solution Take 3 mLs (2.5 mg total) by nebulization every 6 (six) hours as needed for wheezing or shortness of breath. 11/17/22   Salena Saner, MD  albuterol (VENTOLIN HFA) 108 (90 Base) MCG/ACT inhaler Inhale 2 puffs into the lungs every 6 (six) hours as needed for wheezing or shortness of breath. 06/24/21   McLean-Scocuzza, Pasty Spillers, MD  amLODipine (NORVASC) 5 MG tablet Take 1 tablet (5 mg total) by mouth daily. Patient taking differently: Take 5 mg by mouth in the morning and at bedtime. 08/01/21   McLean-Scocuzza, Pasty Spillers, MD  apixaban (ELIQUIS) 5 MG TABS tablet Take 5 mg by mouth 2 (two) times daily. 12/10/22   [provider]  busPIRone (BUSPAR) 5 MG tablet Take 1 tablet (5 mg total) by mouth 2 (two) times daily. 06/02/21   Lurene Shadow, MD  cetirizine (ZYRTEC) 10 MG tablet TAKE 1 TABLET BY MOUTH EVERYDAY AT BEDTIME 06/08/22   Salena Saner, MD  dicyclomine (BENTYL) 10 MG capsule TAKE 1 CAPSULE (10 MG TOTAL) BY MOUTH 2 (TWO) TIMES DAILY AS NEEDED FOR SPASMS. 10/02/22   Wyline Mood, MD  ENBREL SURECLICK 50 MG/ML injection Inject 50 mg into the skin once a week. Takes on Fridays    [provider]  escitalopram (LEXAPRO) 10 MG tablet Take 1 tablet (10 mg total) by mouth daily. 08/01/21   McLean-Scocuzza, Pasty Spillers, MD  famotidine (PEPCID) 20 MG tablet TAKE 1 TABLET BY MOUTH EVERYDAY AT BEDTIME 10/08/22   Wyline Mood, MD  Fluticasone-Umeclidin-Vilant (TRELEGY ELLIPTA) 100-62.5-25 MCG/ACT AEPB Inhale 1 puff into the lungs daily. 12/01/22   Salena Saner, MD  gabapentin (NEURONTIN) 300 MG capsule Take 300 mg in the morning and 600 mg at night. 01/11/23   Dana Allan, MD  GEMTESA 75 MG TABS TAKE 75 MG BY MOUTH DAILY. 03/08/23   Vaillancourt, Lelon Mast, PA-C  lamoTRIgine (LAMICTAL) 100 MG tablet TAKE 1 TAB 2 TIMES DAILY. FURTHER REFILLS NEW PSYCH FOR ALL PSYCH MEDS ONLY TEMP SUPPLY FROM PCP 05/02/21   McLean-Scocuzza, Pasty Spillers, MD   leflunomide (ARAVA) 20 MG tablet Take 1 tablet (20 mg total) by mouth daily. 09/30/17   McLean-Scocuzza, Pasty Spillers, MD  lidocaine (LIDODERM) 5 % Place 1 patch onto the skin 2 (two) times daily as needed. Remove & Discard patch within 12 hours or as directed by MD 11/22/20   McLean-Scocuzza, Pasty Spillers, MD  lovastatin (MEVACOR) 20 MG tablet Take 1 tablet (20 mg total) by mouth daily at 6 PM. 08/04/22   Dana Allan, MD  methocarbamol (ROBAXIN) 500 MG tablet Take 500 mg by mouth 3 (three) times daily.    [provider]  mometasone (NASONEX) 50 MCG/ACT nasal spray USE 1 SPRAY TO EACH NOSTRIL TWICE A DAY 10/08/22   Salena Saner, MD  montelukast (SINGULAIR) 10 MG tablet TAKE 1 TABLET BY MOUTH EVERY DAY 11/12/22   Salena Saner, MD  multivitamin-lutein [facility]) CAPS capsule Take 1 capsule by mouth at bedtime.    [provider]  nebivolol (BYSTOLIC) 5 MG tablet Take 1 tablet (5 mg total) by mouth in the morning and at bedtime. (Note dose changed from 1/2 10 mg bid to 5 mg bid) 08/01/21   McLean-Scocuzza, Pasty Spillers, MD  OHTUVAYRE 3 MG/2.5ML SUSP Inhale 1 ampule into the lungs in the morning and at bedtime. 12/25/22   [provider]  ondansetron (ZOFRAN) 4 MG tablet Take 1 tablet (4 mg total) by mouth every 6 (six) hours as needed for nausea. 08/14/22   Esaw Grandchild A, DO  OXYGEN Inhale 3 L into the lungs continuous.    [provider]  pantoprazole (PROTONIX) 40 MG tablet TAKE 1 TABLET BY MOUTH 2 TIMES DAILY 30 MIN BEFORE FOOD (NOTE REDUCTION IN FREQUENCY) 02/15/23   Dana Allan, MD  predniSONE (DELTASONE) 5 MG tablet TAKE 1 TABLET BY MOUTH EVERY DAY WITH BREAKFAST 01/04/23   Salena Saner, MD  PROLIA 60 MG/ML SOSY injection Inject 60 mg into the skin every 6 (six) months. 03/09/22   [provider]  QUEtiapine (SEROQUEL) 50 MG tablet Take 50 mg by mouth at bedtime. 02/10/23   [provider]  sucralfate (CARAFATE) 1 g tablet TAKE 1 TABLET BY  MOUTH TWICE A DAY 11/02/22   Wyline Mood, MD  torsemide (DEMADEX) 20 MG tablet Take 2 tablets (40 mg total) by mouth daily. 07/19/22 09/15/22  Tresa Moore, MD  traMADol (ULTRAM) 50 MG tablet Take 1 tablet (50 mg total) by mouth every 12 (twelve) hours as needed. 07/28/22   Edward Jolly, MD    Physical Exam: BP 121/77 (BP Location: Right Arm)   Pulse (!) 116   Temp 99.9 F (37.7 C) (Oral)   Resp 20   Ht 4\' 11"  (1.499 m)   Wt 79.4 kg   SpO2 96%   BMI 35.35 kg/m  General: Pleasant, acutely ill-appearing elderly woman laying in bed. No acute distress. HEENT: [place]/AT. Anicteric sclera.  Dry mucous membrane. CV: Tachycardic. Regular rhythm. No murmurs, rubs, or gallops. Edematous left foot. Pulmonary: On 2 L [place]. Lungs CTAB. Normal effort. Rhonchorous lung sounds with mild expiratory wheezing. No rales. Abdominal: Soft, nontender, nondistended. Normal bowel sounds. Extremities: Palpable radial and DP pulses. Normal ROM. Skin: Warm and dry. LLE with sutures laceration above the ankle with mild tenderness, warmth, edema and surrounding erythema. Neuro: A&Ox3. Moves all extremities. Normal sensation to light touch. No focal deficit. Psych: Normal mood and affect          Labs on Admission:  Basic Metabolic Panel: Recent Labs  Lab 03/23/23 1359  NA 136  K 2.8*  CL 96*  CO2 25  GLUCOSE 158*  BUN 27*  CREATININE 1.74*  CALCIUM 8.2*   Liver Function Tests: Recent Labs  Lab 03/23/23 1359  AST 25  ALT 18  ALKPHOS 73  BILITOT 1.0  PROT 7.2  ALBUMIN 2.8*   No results for input(s): "LIPASE", "AMYLASE" in the last 168 hours. No results for input(s): "AMMONIA" in the last 168 hours. CBC: Recent Labs  Lab 03/23/23 1359  WBC 21.1*  NEUTROABS 15.7*  HGB 9.5*  HCT 30.3*  MCV 85.4  PLT 254   Cardiac Enzymes: No results for input(s): "CKTOTAL", "CKMB", "CKMBINDEX", "TROPONINI" in the last 168 hours. BNP (last 3 results) Recent Labs    07/16/22 1437 02/11/23 1350  03/11/23 1402  BNP 446.2* 195.9* 277.0*    ProBNP (last 3 results) No results for input(s): "PROBNP" in the last 8760 hours.  CBG: No results for input(s): "GLUCAP" in the last 168 hours.  Radiological Exams on Admission: DG Chest 2 View Result Date: 03/23/2023 CLINICAL DATA:  Suspected Sepsis Pain. EXAM: CHEST - 2 VIEW COMPARISON:  Chest radiograph 02/11/2023 FINDINGS: Patient's chin obscures the apex. Stable heart size and mediastinal contours, mild cardiomegaly. Multifocal lung opacities, greatest in the bases, suspicious for pneumonia.  No pneumothorax or large pleural effusion. Exaggerated thoracic kyphosis. Bilateral reverse shoulder arthroplasties. Remote bilateral rib fractures. IMPRESSION: Patchy multifocal lung opacities, greatest in the bases, suspicious for multifocal pneumonia. Electronically Signed   By: Narda Rutherford M.D.   On: 03/23/2023 16:34   Assessment/Plan Dale McConnell Kerri Carter is a 83 y.o. female with medical history significant for ILD and COPD on 2-3 L oxygen, hypertension, hyperlipidemia, PVD, CAD, dCHF, depression with anxiety, bipolar, CKD 3B, A-fib on Eliquis, OSA on CPAP, obesity, who presented via EMS for evaluation of generalized weakness, altered mental status and worsening wound and admitted for severe sepsis secondary to cellulitis and pneumonia in the setting of flu a infection.  # Severe sepsis Elderly patient presented with generalized weakness, chills, cough and worsening left foot infection after a recent hospitalization found to have multifocal pneumonia in the setting of influenza A infection. Patient met severe sepsis criteria with tachycardia, fever, leukocytosis, and elevated lactic acid in the setting of her respiratory and skin infections resulting in acute kidney injury. She is hemodynamically stable on IV fluids and IV antibiotics. -Continue IV vancomycin and aztreonam -Continue IV LR at 150 cc/h for 20 hours -Trend lactic acid until normal -Follow-up  blood cultures and UA -Trend CBC, fever curve  # Multifocal pneumonia # Influenza A infection Patient with a history of COPD with a chronic cough who was exposed to influenza infection last week found to have a productive cough, fever and leukocytosis. RVP positive for influenza A PCR. CXR shows evidence of multifocal pneumonia. She stable from respiratory standpoint on only 2 L [place].  Patient with recent hospital contact so we will continue current antibiotics. Tamiflu unlikely to be effective at this point of her infection. -Continue IV vancomycin and aztreonam -Add IV azithromycin 500 mg daily -Mucinex DM twice daily -Check MRSA screen, urinary Legionella and strep pneumo -Follow-up blood cultures, sputum culture and procalcitonin -Incentive spirometer, flutter valve -Droplet precautions  # Cellulitis of the left lower extremity Patient recently discharged after a brief admission for cellulitis of her left lower extremity now presenting with worsening left leg wound and some purulent drainage noticed by family at home. Patient met sepsis criteria on admission but remains at baseline mental status. -Continue on broad-spectrum antibiotic with IV vancomycin and aztreonam -Wound care consulted, appreciate recs  # COPD # Chronic hypoxic respiratory failure Patient on baseline 2 to 3 L [place] at home. She reports a chronic cough for her COPD and has become slightly more productive. She denies any shortness of breath and currently on her baseline 2 L [place]. Mild expiratory wheezing on exam. -Continue supplemental O2 -Continue home bronchodilators -Continue Singulair -As needed DuoNebs  # AKI on CKD 3B Baseline creatinine seems to be around 1.1-1.2. Creatinine bumped to 1.74 on admission in the setting of dehydration from recent poor p.o. intake and infection. -Continue IV hydration as above -Avoid nephrotoxic's agents -Trend renal function  # HFpEF Last TTE in February 2024 showed EF 55 to 65%,  G2 DD. Patient slightly dry on exam. -Hold home torsemide today in the setting of AKI and volume depletion -Continue supplemental O2  # Hypokalemia Has a history of hypokalemia likely due to diuretic. K+ down to 2.8 on admission. Has received a total of 60 mEq of KCl in the ED. -Give additional oral KCl 40 mEq x 1 -Follow-up morning BMP, mag  # HTN BP stable in the 100s to 130s. Will hold amlodipine today in the setting of volume depletion and resume  tomorrow. -Continue amlodipine and nebivolol  # A-fib Patient tachycardic with HR in the 100s to 110s. Reports she has not taken any of her meds today. -Continue Eliquis and nebivolol  # Rheumatoid arthritis -Continue home leflunomide  # HLD -Continue pravastatin (substitute for lovastatin)  # Bipolar disorder -Continue Lamictal, Lexapro and BuSpar  # OSA -Continue CPAP at night  # Generalized weakness In the setting of recent infection and poor p.o. intake. -Start Ensure twice daily -PT/OT eval and treat  # Obesity Body mass index is 35.35 kg/m. -Weight loss and nutrition counseling with PCP in the outpatient  DVT prophylaxis: Eliquis    Code Status: Limited: Do not attempt resuscitation (DNR) -DNR-LIMITED -Do Not Intubate/DNI   Consults called: None  Family Communication: Discussed admission with son at bedside  Severity of Illness: The appropriate patient status for this patient is INPATIENT. Inpatient status is judged to be reasonable and necessary in order to provide the required intensity of service to ensure the patient's safety. The patient's presenting symptoms, physical exam findings, and initial radiographic and laboratory data in the context of their chronic comorbidities is felt to place them at high risk for further clinical deterioration. Furthermore, it is not anticipated that the patient will be medically stable for discharge from the hospital within 2 midnights of admission.   * I certify that at the  point of admission it is my clinical judgment that the patient will require inpatient hospital care spanning beyond 2 midnights from the point of admission due to high intensity of service, high risk for further deterioration and high frequency of surveillance required.*  Level of care:  Med telemetry  Steffanie Rainwater, MD 03/23/2023, 6:18 PM [facility] Pager: [phone] Isaiah 41:10   If 7PM-7AM, please contact night-coverage [email or web address] Password [email or web address]

## 2023-03-23 NOTE — ED Provider Triage Note (Signed)
Emergency Medicine Provider Triage Evaluation Note  Kerri Carter , a 83 y.o. female  was evaluated in triage.  Pt complains of infected wound, fever today, .was on linezolid  Review of Systems  Positive:  Negative:   Physical Exam  There were no vitals taken for this visit. Gen:   Awake, no distress   Resp:  Normal effort  MSK:   Bandage on wounds Other:    Medical Decision Making  Medically screening exam initiated at 1:55 PM.  Appropriate orders placed.  Kerri Carter was informed that the remainder of the evaluation will be completed by another provider, this initial triage assessment does not replace that evaluation, and the importance of remaining in the ED until their evaluation is complete.     Faythe Ghee, PA-C 03/23/23 [phone]

## 2023-03-23 NOTE — ED Notes (Signed)
First nurse note: From home [facility] for "feeling sick" and "pain all over that comes in waves". Somewhat altered per EMS T 100, pulse 128, 94% on 2L (chronic), 119/90.

## 2023-03-23 NOTE — Telephone Encounter (Signed)
 Noted

## 2023-03-23 NOTE — ED Notes (Signed)
Pt assisted to restroom. 2 person assist

## 2023-03-23 NOTE — ED Notes (Signed)
Dr Fanny Bien notified lactic 2.1

## 2023-03-23 NOTE — Telephone Encounter (Signed)
S/W pt, she is having some other health issues at this time and would like to cancel all future [facility] appts, until she gets straightened out.  Then, she will call back to r/s appts.

## 2023-03-23 NOTE — ED Triage Notes (Signed)
Pt to ED from home with [facility], son at bedside. Pt has chronic wound to L ankle, currently bandaged. Son states pt has had intermittent episodes of altered mental status since 2 days ago wound had purulent drainage 2 days ago. Pt had 103 fever today per son. No tylenol given today yet per son. Pt wears 2-3L chronic. On 2L now. Alert and oriented, afebrile.

## 2023-03-24 ENCOUNTER — Inpatient Hospital Stay: Payer: Medicare PPO

## 2023-03-24 ENCOUNTER — Encounter: Payer: Self-pay | Admitting: Student

## 2023-03-24 DIAGNOSIS — J13 Pneumonia due to Streptococcus pneumoniae: Secondary | ICD-10-CM

## 2023-03-24 DIAGNOSIS — J9621 Acute and chronic respiratory failure with hypoxia: Secondary | ICD-10-CM

## 2023-03-24 DIAGNOSIS — I4891 Unspecified atrial fibrillation: Secondary | ICD-10-CM

## 2023-03-24 DIAGNOSIS — A419 Sepsis, unspecified organism: Secondary | ICD-10-CM | POA: Diagnosis not present

## 2023-03-24 DIAGNOSIS — R000 Tachycardia, unspecified: Secondary | ICD-10-CM

## 2023-03-24 DIAGNOSIS — R6520 Severe sepsis without septic shock: Secondary | ICD-10-CM | POA: Diagnosis not present

## 2023-03-24 DIAGNOSIS — N179 Acute kidney failure, unspecified: Secondary | ICD-10-CM

## 2023-03-24 DIAGNOSIS — J441 Chronic obstructive pulmonary disease with (acute) exacerbation: Secondary | ICD-10-CM

## 2023-03-24 DIAGNOSIS — G9341 Metabolic encephalopathy: Secondary | ICD-10-CM

## 2023-03-24 DIAGNOSIS — J09X2 Influenza due to identified novel influenza A virus with other respiratory manifestations: Secondary | ICD-10-CM | POA: Diagnosis not present

## 2023-03-24 DIAGNOSIS — N1832 Chronic kidney disease, stage 3b: Secondary | ICD-10-CM

## 2023-03-24 DIAGNOSIS — I5031 Acute diastolic (congestive) heart failure: Secondary | ICD-10-CM

## 2023-03-24 LAB — BLOOD GAS, ARTERIAL
Acid-base deficit: 2 mmol/L (ref 0.0–2.0)
Bicarbonate: 23.7 mmol/L (ref 20.0–28.0)
O2 Content: 15 L/min
O2 Saturation: 97.6 %
Patient temperature: 37
pCO2 arterial: 43 mm[Hg] (ref 32–48)
pH, Arterial: 7.35 (ref 7.35–7.45)
pO2, Arterial: 139 mm[Hg] — ABNORMAL HIGH (ref 83–108)

## 2023-03-24 LAB — CBC
HCT: 25.9 % — ABNORMAL LOW (ref 36.0–46.0)
Hemoglobin: 8.1 g/dL — ABNORMAL LOW (ref 12.0–15.0)
MCH: 26.5 pg (ref 26.0–34.0)
MCHC: 31.3 g/dL (ref 30.0–36.0)
MCV: 84.6 fL (ref 80.0–100.0)
Platelets: 247 10*3/uL (ref 150–400)
RBC: 3.06 MIL/uL — ABNORMAL LOW (ref 3.87–5.11)
RDW: 15.6 % — ABNORMAL HIGH (ref 11.5–15.5)
WBC: 17.3 10*3/uL — ABNORMAL HIGH (ref 4.0–10.5)
nRBC: 0.5 % — ABNORMAL HIGH (ref 0.0–0.2)

## 2023-03-24 LAB — RENAL FUNCTION PANEL
Albumin: 2.3 g/dL — ABNORMAL LOW (ref 3.5–5.0)
Anion gap: 10 (ref 5–15)
BUN: 24 mg/dL — ABNORMAL HIGH (ref 8–23)
CO2: 23 mmol/L (ref 22–32)
Calcium: 7.6 mg/dL — ABNORMAL LOW (ref 8.9–10.3)
Chloride: 102 mmol/L (ref 98–111)
Creatinine, Ser: 1.35 mg/dL — ABNORMAL HIGH (ref 0.44–1.00)
GFR, Estimated: 40 mL/min — ABNORMAL LOW (ref >60)
Glucose, Bld: 158 mg/dL — ABNORMAL HIGH (ref 70–99)
Phosphorus: 2.4 mg/dL — ABNORMAL LOW (ref 2.5–4.6)
Potassium: 3.4 mmol/L — ABNORMAL LOW (ref 3.5–5.1)
Sodium: 135 mmol/L (ref 135–145)

## 2023-03-24 LAB — MAGNESIUM: Magnesium: 1.7 mg/dL (ref 1.7–2.4)

## 2023-03-24 LAB — STREP PNEUMONIAE URINARY ANTIGEN: Strep Pneumo Urinary Antigen: POSITIVE — AB

## 2023-03-24 MED ORDER — VANCOMYCIN HCL IN DEXTROSE 1-5 GM/200ML-% IV SOLN
1000.0000 mg | Freq: Once | INTRAVENOUS | Status: AC
  Administered 2023-03-24: 1000 mg via INTRAVENOUS
  Filled 2023-03-24: qty 200

## 2023-03-24 MED ORDER — IPRATROPIUM-ALBUTEROL 0.5-2.5 (3) MG/3ML IN SOLN
3.0000 mL | RESPIRATORY_TRACT | Status: DC
  Administered 2023-03-24 – 2023-03-25 (×9): 3 mL via RESPIRATORY_TRACT
  Filled 2023-03-24 (×9): qty 3

## 2023-03-24 MED ORDER — SODIUM CHLORIDE 0.9 % IV SOLN
3.0000 g | Freq: Four times a day (QID) | INTRAVENOUS | Status: DC
  Administered 2023-03-24 (×2): 3 g via INTRAVENOUS
  Filled 2023-03-24 (×2): qty 8

## 2023-03-24 MED ORDER — FUROSEMIDE 10 MG/ML IJ SOLN
80.0000 mg | Freq: Once | INTRAMUSCULAR | Status: AC
  Administered 2023-03-24: 80 mg via INTRAVENOUS
  Filled 2023-03-24: qty 8

## 2023-03-24 MED ORDER — VANCOMYCIN HCL 500 MG/100ML IV SOLN
500.0000 mg | INTRAVENOUS | Status: DC
  Filled 2023-03-24: qty 100

## 2023-03-24 MED ORDER — APIXABAN 5 MG PO TABS
5.0000 mg | ORAL_TABLET | Freq: Two times a day (BID) | ORAL | Status: DC
  Administered 2023-03-24 (×2): 5 mg via ORAL
  Administered 2023-03-25: 2.5 mg via ORAL
  Filled 2023-03-24 (×3): qty 1

## 2023-03-24 MED ORDER — POTASSIUM CHLORIDE 10 MEQ/100ML IV SOLN
10.0000 meq | INTRAVENOUS | Status: AC
  Administered 2023-03-24 (×4): 10 meq via INTRAVENOUS
  Filled 2023-03-24 (×2): qty 100

## 2023-03-24 MED ORDER — FUROSEMIDE 10 MG/ML IJ SOLN
40.0000 mg | Freq: Once | INTRAMUSCULAR | Status: AC
  Administered 2023-03-24: 40 mg via INTRAVENOUS
  Filled 2023-03-24: qty 4

## 2023-03-24 MED ORDER — BUDESONIDE 0.5 MG/2ML IN SUSP
0.5000 mg | Freq: Two times a day (BID) | RESPIRATORY_TRACT | Status: DC
  Administered 2023-03-24 – 2023-03-25 (×3): 0.5 mg via RESPIRATORY_TRACT
  Filled 2023-03-24 (×3): qty 2

## 2023-03-24 MED ORDER — SODIUM CHLORIDE 0.9 % IV SOLN
2.0000 g | INTRAVENOUS | Status: DC
  Administered 2023-03-24 – 2023-03-25 (×2): 2 g via INTRAVENOUS
  Filled 2023-03-24 (×2): qty 20

## 2023-03-24 MED ORDER — SODIUM CHLORIDE 0.9 % IV SOLN
2.0000 g | Freq: Three times a day (TID) | INTRAVENOUS | Status: DC
Start: 2023-03-24 — End: 2023-03-24

## 2023-03-24 MED ORDER — VANCOMYCIN HCL IN DEXTROSE 1-5 GM/200ML-% IV SOLN: 1000.0000 mg | INTRAVENOUS | Status: DC

## 2023-03-24 MED ORDER — METHYLPREDNISOLONE SODIUM SUCC 40 MG IJ SOLR
40.0000 mg | Freq: Every day | INTRAMUSCULAR | Status: DC
  Administered 2023-03-24 – 2023-03-25 (×2): 40 mg via INTRAVENOUS
  Filled 2023-03-24 (×2): qty 1

## 2023-03-24 MED ORDER — MORPHINE SULFATE (PF) 2 MG/ML IV SOLN
1.0000 mg | INTRAVENOUS | Status: DC | PRN
  Administered 2023-03-24 (×2): 1 mg via INTRAVENOUS
  Filled 2023-03-24 (×2): qty 1

## 2023-03-24 NOTE — Consult Note (Addendum)
NAME:  Kerri Carter, MRN:  [id number], DOB:  1941/01/01, LOS: 1 ADMISSION DATE:  03/23/2023, CONSULTATION DATE:  03/24/2023 REFERRING MD:  Dr. Georgeann Oppenheim, CHIEF COMPLAINT:  AMS, worsening LE wound, generalized weakness.   Brief Pt Description / Synopsis:  83 y.o. female, DNR/DNI, with PMHx significant for ILD, COPD on 2-3 L supplemental oxygen at baseline, OSA on CPAP, HTN, HLD, HFpEF, CAD, CKD Stage IIIb, A. Fib on Eliquis,  who is admitted with Acute Metabolic Encephalopathy, Severe Sepsis, Acute on Chronic Hypoxic Respiratory Failure in the setting of Influenza A infection, Streptococcus Pneumoniae pneumonia, pulmonary edema, and AECOPD requiring BiPAP.  Also with AKI and concern for cellulitis of the left lower extremity.  History of Present Illness:  Kerri Carter is a 83 y.o. female with past medical history significant for ILD and COPD on 2-3 L oxygen, hypertension, hyperlipidemia, PVD, CAD, dCHF, depression with anxiety, bipolar, CKD 3B, A-fib on Eliquis, OSA on CPAP, obesity, who presented via EMS to [facility] ED on 03/23/23 for evaluation of generalized weakness, altered mental status and worsening wound.  Per son, patient was doing well until Sunday she started having significant weakness and some disorientation. On Monday, there was slight improvement in her mental status. Wound care came to check patient's left ankle wound and noticed purulent drainage. Today, patient was found to have a temperature of 102 with worsening weakness so they called EMS.  On their arrival, patient had a temperature of 103. Patient reports worsening cough that have become productive over the last few days. She also endorsed left flank pain, fatigue, chills and poor appetite but denies any nausea, vomiting, abdominal pain, chest pain, dizziness, palpitations, dysuria, or diarrhea. Son reports that multiple family members have the flu at home.   She was admitted by the hospitalist for further workup and treatment.  Please  see "significant hospital events" section below for full detailed hospital course.  Pertinent  Medical History   Past Medical History:  Diagnosis Date   Acromial process of scapula fracture 12/21/2019   Acute kidney injury superimposed on chronic kidney disease (HCC) 05/26/2021   Acute on chronic combined systolic (congestive) and diastolic (congestive) heart failure (HCC) 07/28/2021   Acute on chronic combined systolic and diastolic CHF (congestive heart failure) (HCC) 05/26/2021   Acute on chronic respiratory failure with hypoxia (HCC) 09/10/2021   Acute pain of left knee 12/18/2020   Anemia in stage 3a chronic kidney disease (HCC) 10/09/2021   Anxiety    Anxiety 01/20/2019   Anxiety and depression 05/26/2021   Aortic stenosis 07/09/2015   a.) TTE 07/06/2015: EF 55-60%; mild AS with MPG 13.0 mmHg.   Arrhythmia    atrial fibrillation   Assistance needed for mobility 12/18/2020   Asthma    At high risk for falls 03/16/2019   Avascular necrosis of left shoulder due to adverse effect of steroid therapy (HCC) 01/20/2018   Bipolar disorder (HCC)    C. difficile diarrhea 2010   a.) following ABX course during hospital admission   Carotid atherosclerosis, bilateral    a.) Moderate; < 50% stenosis BILATERAL ICAs.   Cataract    a.) s/p BILATERAL extraction   CHF (congestive heart failure) (HCC)    Chicken pox    Chronic left shoulder pain 08/29/2019   Chronic respiratory failure with hypoxia (HCC) 10/01/2021   CKD (chronic kidney disease), stage III (HCC)    a. s/p R nephrectomy./ aneurysm   Closed fracture of acromial process of scapula 10/28/2018   Closed  nondisplaced fracture of proximal phalanx of lesser toe of right foot    Collapsed vertebra, not elsewhere classified, lumbar region, initial encounter for fracture (HCC) 02/21/2019   Community acquired pneumonia 07/22/2021   Complicated grief 03/16/2019   Compression fracture of L4 vertebra (HCC) 02/21/2019   Conversion  disorder    Conversion disorder 08/04/2015   COPD (chronic obstructive pulmonary disease) (HCC)    COPD with exacerbation (HCC) 08/20/2021   Cough 05/02/2012   Depression    Dislocation of hip joint prosthesis (HCC) 01/08/2017   Elevated brain natriuretic peptide (BNP) level 12/18/2020   Elevated d-dimer 05/26/2021   Episodic mood disorder (HCC) 04/18/2019   Essential hypertension    Fatigue 07/07/2018   Frequent falls 12/18/2020   GERD (gastroesophageal reflux disease)    H/O cardiac catheterization 02/24/2011   Formatting of this note might be different from the original.  Repeated at [facility] 2/13 and insignificant disease   Hav (hallux abducto valgus), unspecified laterality 12/05/2018   Heart murmur    Hip fracture (HCC) 10/31/2016   Hip pain 12/18/2020   History of fracture of left hip 01/15/2017   History of syncope 08/25/2016   Hyperkalemia 05/26/2021   - The patient was given p.o. Lokelma and IV Lasix.  - We will follow potassium level.   Hyperlipidemia    Hypokalemia 08/25/2016   ILD (interstitial lung disease) (HCC)    mild; 2/2 RA diagnosis   Iliopsoas bursitis of right hip 08/29/2019   Inflammatory arthritis    a. hands/carpal tunnel.  b. Low titer rheumatoid factor. c. Negative anti-CCP antibodies. d. Plaquenil.   Insomnia 07/17/2015   Intractable nausea and vomiting 08/10/2022   Left foot pain 10/19/2016   Left lower lobe pneumonia 05/26/2021   Leg edema 05/02/2019   Leg pain, bilateral    Leg swelling 12/15/2016   Leukocytosis 10/19/2017   Leukopenia 06/24/2021   Lymphocytosis 07/13/2021   Macular degeneration    Microcytic anemia 08/15/2015   Mixed bipolar I disorder (HCC) 10/09/2015   Nocturnal hypoxemia    Nocturnal hypoxemia 09/09/2011   June 2013 overnight oximetry on 6 centimeters of water CPAP: SpO2 less than 88% 72% of the time   Non-Obstructive CAD    a. 07/2009 Cath ([facility]): nonobs dzs;  b. 03/2011 Cath [facility]): nonobs dzs.   NSIP (nonspecific  interstitial pneumonitis) (HCC) 12/08/2019   Osteoarthritis    a. Knees.   Other shoulder lesions, left shoulder 12/20/2017   Other specified postprocedural states 07/20/2013   Overweight (BMI 25.0-29.9) 08/29/2019   PAD (peripheral artery disease) (HCC)    Pain due to onychomycosis of toenails of both feet 12/05/2018   Pelvic mass in female 03/16/2019   02/21/19 CT pelvis w/o contrast  Cystic left adnexal lesion has enlarged since 2018 where it  measured approximately 1.8 x 1.7 cm. This is not well assessed due  to streak artifact as well as lack of contrast. Ultrasound follow-up  may be helpful in 8-12 weeks      04/05/19 TVUS     IMPRESSION:  Simple appearing cystic structure within the left ovary measuring up  to 2.9 cm. While this has enlarged s   Physical deconditioning 02/27/2021   Pleural effusion on left 12/18/2020   PUD (peptic ulcer disease)    Recurrent falls 09/01/2021   Recurrent pneumonia 07/23/2021   Repeated falls 12/18/2020   Respiratory failure with hypoxia (HCC) 01/12/2021   Rotator cuff tendinitis, right 07/26/2017   S/P lumbar fusion 05/23/2021   S/P  right hip fracture    11/01/16 s/p repair   Sepsis due to pneumonia (HCC) 07/22/2021   Shoulder pain    Sleep apnea    no cpap / minimal   SOB (shortness of breath) 10/13/2011   2011 [facility] eval >> LHC (non-obstructive CAD), RHC (wedge 13, mean PA 26), TTE (LVEF > 55%, mild concentric LVH, mild AR, MR, PR, TR), CTAngio chest unrevealing; "well controlled asthma"  02/2011 TTE [facility]>> NL LVEF, Mild LVH, Mod AR/Mod MR   Spinal stenosis at L4-L5 level    severe with L4/L5 anterolisthesis grade 1 anterolisthesis    Spinal stenosis of lumbar region 09/15/2016   Last Assessment & Plan:   Now post op with continued back and leg pain noted. Is working with pt/ot and surgery was said to have no complications. Bowels are moving and general pain and anxiety is basically baseline.    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   Weakness of both lower extremities 12/18/2020    Micro Data:  2/18: SARS-CoV-2/RSV/flu PCR>> + influenza A 2/18: Strep pneumo urinary antigen + 2/18: Legionella urinary antigen>> 2/18: Blood culture x 2>> 2/18: Urine>> 2/18: Sputum>> 2/18: MRSA PCR>>  Antimicrobials:   Anti-infectives (From admission, onward)    Start     Dose/Rate Route Frequency Ordered Stop   03/25/23 1800  vancomycin (VANCOCIN) IVPB 1000 mg/200 mL premix       Placed in "Followed by" Linked Group   1,000 mg 200 mL/hr over 60 Minutes Intravenous Every 48 hours 03/24/23 1045     03/24/23 1800  vancomycin (VANCOREADY) IVPB 500 mg/100 mL  Status:  Discontinued       Placed in "Followed by" Linked Group   500 mg 100 mL/hr over 60 Minutes Intravenous Every 24 hours 03/24/23 0114 03/24/23 1045   03/24/23 1200  aztreonam (AZACTAM) 2 g in sodium chloride 0.9 % 100 mL IVPB  Status:  Discontinued        2 g 200 mL/hr over 30 Minutes Intravenous Every 8 hours 03/24/23 1030 03/24/23 1031   03/24/23 1200  Ampicillin-Sulbactam (UNASYN) 3 g in sodium chloride 0.9 % 100 mL IVPB        3 g 200 mL/hr over 30 Minutes Intravenous Every 6 hours 03/24/23 1031     03/24/23 0200  vancomycin (VANCOCIN) IVPB 1000 mg/200 mL premix       Placed in "Followed by" Linked Group   1,000 mg 200 mL/hr over 60 Minutes Intravenous  Once 03/24/23 0114 03/24/23 0345   03/23/23 2200  azithromycin (ZITHROMAX) 500  mg in sodium chloride 0.9 % 250 mL IVPB        500 mg 250 mL/hr over 60 Minutes Intravenous Every 24 hours 03/23/23 1945 03/28/23 2159   03/23/23 1615  aztreonam (AZACTAM) 2 g in sodium chloride 0.9 % 100 mL IVPB        2 g 200 mL/hr over 30 Minutes Intravenous  Once 03/23/23 1609  03/23/23 1702   03/23/23 1615  vancomycin (VANCOCIN) IVPB 1000 mg/200 mL premix        1,000 mg 200 mL/hr over 60 Minutes Intravenous  Once 03/23/23 1609 03/23/23 1923       Significant Hospital Events: Including procedures, antibiotic start and stop dates in addition to other pertinent events   2/18: Admitted by hospitalist.  Overnight with worsening shortness of breath and increasing FiO2 requirements requiring placement of BiPAP. 2/19: Remains BiPAP dependent.  Concern for development of pulmonary edema, given 40 mg IV Lasix x 2 doses.  PCCM consulted. Strep Pneumo urinary antigen came back positive.  Consult Palliative Care to assist with GOC.  Interim History / Subjective:  As outlined above under Significant Hospital Events section above  Objective   Blood pressure 136/85, pulse (!) 107, temperature 98.1 F (36.7 C), resp. rate (!) 33, height 4\' 11"  (1.499 m), weight 79.4 kg, SpO2 90%.    FiO2 (%):  [80 %] 80 %   Intake/Output Summary (Last 24 hours) at 03/24/2023 1653 Last data filed at 03/24/2023 0345 Gross per 24 hour  Intake 640.87 ml  Output --  Net 640.87 ml   Filed Weights   03/23/23 1357  Weight: 79.4 kg    Examination: General: Acute on chronically ill-appearing, obese elderly female, laying in bed, on BiPAP with mild respiratory distress HENT: Atraumatic, normocephalic, neck supple, difficult to assess JVD due to body habitus and BiPAP Lungs: Coarse crackles throughout with faint expiratory wheezing, mild tachypnea and accessory muscle use, BiPAP assisted, even Cardiovascular: Tachycardia, regular rhythm, S1-S2, no murmurs, rubs, gallops Abdomen: Obese, soft, nontender,  nondistended, no guarding or rebound tenderness, bowel sounds positive x 4 Extremities: Generalized weakness, normal bulk and tone, left lower extremity cellulitis and wound Neuro: Sleeping, arouses easily to voice, oriented x 3, moves all extremities to command, no focal deficits, speech clear, pupils PERRLA GU: Deferred  Resolved Hospital Problem list     Assessment & Plan:   #Acute on Chronic Hypoxic Respiratory Failure in the setting of setting of Influenza A infection, Streptococcus Pneumoniae pneumonia, pulmonary edema, and Acute COPD Exacerbation  PMHx: ILD, COPD requiring 2-3L supplemental O2, OSA on CPAP -Supplemental O2 as needed to maintain O2 sats 88 to 92% -BiPAP, wean as tolerated (pt is DNR/DNI) -Follow intermittent Chest X-ray & ABG as needed -Bronchodilators & Pulmicort nebs -IV Steroids -ABX as above -Diuresis as BP and renal function permits ~ received 80 mg IV Lasix x2 doses on 2/19 -Pulmonary toilet as able  #Severe Sepsis #Influenza A infection #Streptococcus Pneumoniae Pneumonia #Left Lower Extremity Cellulitis -Monitor fever curve -Trend WBC's  -Follow cultures as above -Continue empiric Azithromycin, Ceftriaxone, and Vancomycin pending cultures & sensitivities -Wound care consulted, appreciate input  #Acute Decompensated HFpEF #Tachycardia, suspect compensatory due to Acute Respiratory Failure and Sepsis #Atrial Fibrillation PMHx: HTN, HLD Echocardiogram 03/30/22: LVEF 55 to 60%, grade 2 diastolic dysfunction, RV systolic function normal, RV size normal, mild to moderate mitral valve regurgitation, mild to moderate aortic valve regurgitation, mild to moderate aortic valve stenosis -Continuous cardiac monitoring -Maintain MAP >65 -Hold IV fluids for now -Vasopressors as needed to maintain MAP goal ~ not requiring -Diuresis as BP and renal function permits ~ received 80 mg IV Lasix x2 dose on 2/19 -Continue Eliquis and Nebivolol -Repeat echocardiogram  pending  #AKI on CKD Stage IIIb #Hypokalemia -Monitor I&O's / urinary output -Follow BMP -Ensure adequate renal perfusion -Avoid nephrotoxic agents as able -Replace electrolytes as indicated ~ Pharmacy following for  assistance with electrolyte replacement  #Acute Metabolic Encephalopathy PMHx: Bipolar disorder -Treatment of metabolic derangements as outlined above -Provide supportive care -Promote normal sleep/wake cycle and family presence -Avoid sedating medications as able -Resume home Lamictal, Lexapro, BuSpar once respiratory status improves and able to tolerate p.o. intake     Patient is critically ill with sepsis and acute hypoxic respiratory failure requiring BiPAP.  High risk for further respiratory decompensation, cardiac arrest and death.  Patient is already DNR/DNI.  Given her current critical illness superimposed on multiple chronic comorbidities and advanced age, if clinical course does not improve would consider transition to comfort measures.  Consult palliative care to assist with goals of care.   Best Practice (right click and "Reselect all SmartList Selections" daily)   Diet/type: NPO DVT prophylaxis: DOAC GI prophylaxis: N/A Lines: N/A Foley:  N/A Code Status:  DNR Last date of multidisciplinary goals of care discussion [N/A]  Labs   CBC: Recent Labs  Lab 03/23/23 1359 03/24/23 0257  WBC 21.1* 17.3*  NEUTROABS 15.7*  --   HGB 9.5* 8.1*  HCT 30.3* 25.9*  MCV 85.4 84.6  PLT 254 247    Basic Metabolic Panel: Recent Labs  Lab 03/23/23 1359 03/24/23 0257  NA 136 135  K 2.8* 3.4*  CL 96* 102  CO2 25 23  GLUCOSE 158* 158*  BUN 27* 24*  CREATININE 1.74* 1.35*  CALCIUM 8.2* 7.6*  MG  --  1.7  PHOS  --  2.4*   GFR: Estimated Creatinine Clearance: 30.3 mL/min (A) (by C-G formula based on SCr of 1.35 mg/dL (H)). Recent Labs  Lab 03/23/23 1359 03/23/23 1854 03/24/23 0257  PROCALCITON 0.76  --   --   WBC 21.1*  --  17.3*  LATICACIDVEN  2.1* 2.1*  --     Liver Function Tests: Recent Labs  Lab 03/23/23 1359 03/24/23 0257  AST 25  --   ALT 18  --   ALKPHOS 73  --   BILITOT 1.0  --   PROT 7.2  --   ALBUMIN 2.8* 2.3*   No results for input(s): "LIPASE", "AMYLASE" in the last 168 hours. No results for input(s): "AMMONIA" in the last 168 hours.  ABG    Component Value Date/Time   PHART 7.35 03/24/2023 0557   PCO2ART 43 03/24/2023 0557   PO2ART 139 (H) 03/24/2023 0557   HCO3 23.7 03/24/2023 0557   ACIDBASEDEF 2.0 03/24/2023 0557   O2SAT 97.6 03/24/2023 0557     Coagulation Profile: Recent Labs  Lab 03/23/23 1359  INR 1.6*    Cardiac Enzymes: No results for input(s): "CKTOTAL", "CKMB", "CKMBINDEX", "TROPONINI" in the last 168 hours.  HbA1C: Hgb A1c MFr Bld  Date/Time Value Ref Range Status  07/17/2022 05:20 AM 6.2 (H) 4.8 - 5.6 % Final    Comment:    (NOTE) Pre diabetes:          5.7%-6.4%  Diabetes:              >6.4%  Glycemic control for   <7.0% adults with diabetes   03/31/2022 03:08 AM 6.1 (H) 4.8 - 5.6 % Final    Comment:    (NOTE)         Prediabetes: 5.7 - 6.4         Diabetes: >6.4         Glycemic control for adults with diabetes: <7.0     CBG: No results for input(s): "GLUCAP" in the last 168 hours.  Review of  Systems:   Positives in BOLD: Gen: Denies fever, chills, weight change, fatigue, night sweats HEENT: Denies blurred vision, double vision, hearing loss, tinnitus, sinus congestion, rhinorrhea, sore throat, neck stiffness, dysphagia PULM: Denies shortness of breath, cough, sputum production, hemoptysis, wheezing CV: Denies chest pain, edema, orthopnea, paroxysmal nocturnal dyspnea, palpitations GI: Denies abdominal pain, nausea, vomiting, diarrhea, hematochezia, melena, constipation, change in bowel habits GU: Denies dysuria, hematuria, polyuria, oliguria, urethral discharge Endocrine: Denies hot or cold intolerance, polyuria, polyphagia or appetite change Derm: Denies  rash, dry skin, scaling or peeling skin change Heme: Denies easy bruising, bleeding, bleeding gums Neuro: Denies headache, numbness, weakness, slurred speech, loss of memory or consciousness   Past Medical History:  She,  has a past medical history of Acromial process of scapula fracture (12/21/2019), Acute kidney injury superimposed on chronic kidney disease (HCC) (05/26/2021), Acute on chronic combined systolic (congestive) and diastolic (congestive) heart failure (HCC) (07/28/2021), Acute on chronic combined systolic and diastolic CHF (congestive heart failure) (HCC) (05/26/2021), Acute on chronic respiratory failure with hypoxia (HCC) (09/10/2021), Acute pain of left knee (12/18/2020), Anemia in stage 3a chronic kidney disease (HCC) (10/09/2021), Anxiety, Anxiety (01/20/2019), Anxiety and depression (05/26/2021), Aortic stenosis (07/09/2015), Arrhythmia, Assistance needed for mobility (12/18/2020), Asthma, At high risk for falls (03/16/2019), Avascular necrosis of left shoulder due to adverse effect of steroid therapy (HCC) (01/20/2018), Bipolar disorder (HCC), C. difficile diarrhea (2010), Carotid atherosclerosis, bilateral, Cataract, CHF (congestive heart failure) (HCC), Chicken pox, Chronic left shoulder pain (08/29/2019), Chronic respiratory failure with hypoxia (HCC) (10/01/2021), CKD (chronic kidney disease), stage III (HCC), Closed fracture of acromial process of scapula (10/28/2018), Closed nondisplaced fracture of proximal phalanx of lesser toe of right foot, Collapsed vertebra, not elsewhere classified, lumbar region, initial encounter for fracture (HCC) (02/21/2019), Community acquired pneumonia (07/22/2021), Complicated grief (03/16/2019), Compression fracture of L4 vertebra (HCC) (02/21/2019), Conversion disorder, Conversion disorder (08/04/2015), COPD (chronic obstructive pulmonary disease) (HCC), COPD with exacerbation (HCC) (08/20/2021), Cough (05/02/2012), Depression, Dislocation of hip  joint prosthesis (HCC) (01/08/2017), Elevated brain natriuretic peptide (BNP) level (12/18/2020), Elevated d-dimer (05/26/2021), Episodic mood disorder (HCC) (04/18/2019), Essential hypertension, Fatigue (07/07/2018), Frequent falls (12/18/2020), GERD (gastroesophageal reflux disease), H/O cardiac catheterization (02/24/2011), Hav (hallux abducto valgus), unspecified laterality (12/05/2018), Heart murmur, Hip fracture (HCC) (10/31/2016), Hip pain (12/18/2020), History of fracture of left hip (01/15/2017), History of syncope (08/25/2016), Hyperkalemia (05/26/2021), Hyperlipidemia, Hypokalemia (08/25/2016), ILD (interstitial lung disease) (HCC), Iliopsoas bursitis of right hip (08/29/2019), Inflammatory arthritis, Insomnia (07/17/2015), Intractable nausea and vomiting (08/10/2022), Left foot pain (10/19/2016), Left lower lobe pneumonia (05/26/2021), Leg edema (05/02/2019), Leg pain, bilateral, Leg swelling (12/15/2016), Leukocytosis (10/19/2017), Leukopenia (06/24/2021), Lymphocytosis (07/13/2021), Macular degeneration, Microcytic anemia (08/15/2015), Mixed bipolar I disorder (HCC) (10/09/2015), Nocturnal hypoxemia, Nocturnal hypoxemia (09/09/2011), Non-Obstructive CAD, NSIP (nonspecific interstitial pneumonitis) (HCC) (12/08/2019), Osteoarthritis, Other shoulder lesions, left shoulder (12/20/2017), Other specified postprocedural states (07/20/2013), Overweight (BMI 25.0-29.9) (08/29/2019), PAD (peripheral artery disease) (HCC), Pain due to onychomycosis of toenails of both feet (12/05/2018), Pelvic mass in female (03/16/2019), Physical deconditioning (02/27/2021), Pleural effusion on left (12/18/2020), PUD (peptic ulcer disease), Recurrent falls (09/01/2021), Recurrent pneumonia (07/23/2021), Repeated falls (12/18/2020), Respiratory failure with hypoxia (HCC) (01/12/2021), Rotator cuff tendinitis, right (07/26/2017), S/P lumbar fusion (05/23/2021), S/P right hip fracture, Sepsis due to pneumonia (HCC) (07/22/2021),  Shoulder pain, Sleep apnea, SOB (shortness of breath) (10/13/2011), Spinal stenosis at L4-L5 level, Spinal stenosis of lumbar region (09/15/2016), Stage 3a chronic kidney disease (CKD) (HCC) (07/23/2021), Status post hip hemiarthroplasty (01/08/2017), Status post lumbar spinal fusion (01/15/2017), Status post reverse arthroplasty of  shoulder, left (07/26/2018), Status post reverse total shoulder replacement, right (11/04/2017), Strain of right hip (02/26/2017), Thrombocytosis (06/24/2021), Toe fracture, right (09/09/2021), Toxic maculopathy, 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   Surgical History:   Past Surgical History:  Procedure Laterality Date   APPENDECTOMY     APPLICATION OF INTRAOPERATIVE CT SCAN N/A 05/23/2021   Procedure: APPLICATION OF INTRAOPERATIVE CT SCAN;  Surgeon: Venetia Night, MD;  Location: [facility];  Service: Neurosurgery;  Laterality: N/A;   BACK SURGERY     lumbar fusion   BUNIONECTOMY Right    CATARACT EXTRACTION, BILATERAL     CESAREAN SECTION     x1   CHOLECYSTECTOMY N/A 05/11/2016   Procedure: LAPAROSCOPIC CHOLECYSTECTOMY;  Surgeon: Lattie Haw, MD;  Location: [facility];  Service: General;  Laterality: N/A;   COLONOSCOPY WITH PROPOFOL N/A 04/02/2016   Procedure: COLONOSCOPY WITH PROPOFOL;  Surgeon: Wyline Mood, MD;  Location: [facility];  Service: Endoscopy;  Laterality: N/A;   ENDOSCOPIC RETROGRADE CHOLANGIOPANCREATOGRAPHY (ERCP) WITH PROPOFOL N/A 05/08/2016   Procedure: ENDOSCOPIC RETROGRADE CHOLANGIOPANCREATOGRAPHY (ERCP) WITH PROPOFOL;  Surgeon: Midge Minium, MD;  Location: [facility];  Service: Endoscopy;  Laterality: N/A;   ERCP     with biliary spincterotomy 05/08/16 Dr. Servando Snare for choledocholithiasis    ESOPHAGEAL DILATION  04/02/2016   Procedure: ESOPHAGEAL DILATION;  Surgeon: Wyline Mood, MD;  Location: [facility];  Service: Endoscopy;;    ESOPHAGOGASTRODUODENOSCOPY (EGD) WITH PROPOFOL N/A 04/02/2016   Procedure: ESOPHAGOGASTRODUODENOSCOPY (EGD) WITH PROPOFOL;  Surgeon: Wyline Mood, MD;  Location: [facility];  Service: Endoscopy;  Laterality: N/A;   HIP ARTHROPLASTY Right 11/01/2016   Procedure: ARTHROPLASTY BIPOLAR HIP (HEMIARTHROPLASTY);  Surgeon: Christena Flake, MD;  Location: [facility];  Service: Orthopedics;  Laterality: Right;   LUMBAR LAMINECTOMY/DECOMPRESSION MICRODISCECTOMY N/A 12/11/2020   Procedure: LEFT L2-3 MICRODISCECTOMY, L3-4 AND L5-S1 DECOMPRESSION;  Surgeon: Venetia Night, MD;  Location: [facility];  Service: Neurosurgery;  Laterality: N/A;   NEPHRECTOMY  1988   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   ORIF SCAPHOID FRACTURE Left 12/21/2019   Procedure: OPEN REDUCTION INTERNAL FIXATION (ORIF) OF LEFT SCAPULAR NONUNION WITH BONE GRAFT;  Surgeon: Christena Flake, MD;  Location: [facility];  Service: Orthopedics;  Laterality: Left;   osteoporosis     noted DEXA 08/19/16    REPLACEMENT TOTAL KNEE Right    REVERSE SHOULDER ARTHROPLASTY Right 11/04/2017   Procedure: REVERSE SHOULDER ARTHROPLASTY;  Surgeon: Christena Flake, MD;  Location: [facility] ORS;  Service: Orthopedics;  Laterality: Right;   REVERSE SHOULDER ARTHROPLASTY Left 07/26/2018   Procedure: REVERSE SHOULDER ARTHROPLASTY;  Surgeon: Christena Flake, MD;  Location: [facility];  Service: Orthopedics;  Laterality: Left;   TONSILLECTOMY     TOTAL HIP ARTHROPLASTY  12/10/11   [facility] left hip   TOTAL HIP ARTHROPLASTY Bilateral    TRANSFORAMINAL LUMBAR INTERBODY FUSION (TLIF) WITH PEDICLE SCREW FIXATION 3 LEVEL N/A 05/23/2021   Procedure: OPEN L2-4 TRANSFORAMINAL LUMBAR INTERBODY FUSION (TLIF) WITH L2-5 POSTERIOR SPINAL FUSION;  Surgeon: Venetia Night, MD;  Location: [facility] ORS;  Service: Neurosurgery;  Laterality: N/A;   TUBAL LIGATION       Social History:   reports that she quit smoking about 49 years ago. Her smoking use included cigarettes. She  started smoking about 69 years ago. She has a 10 pack-year smoking history. She has never used smokeless tobacco. She reports that she does not drink alcohol and  does not use drugs.   Family History:  Her family history includes Alcohol abuse in her brother; Asthma in her mother and sister; Breast cancer in her sister; Cancer in her maternal grandmother; Colon cancer in her maternal grandmother; Dementia in her son; Depression in her brother; Diabetes in her maternal grandmother and son; Gout in her son; Heart attack in her father and sister; Heart disease in her brother, brother, father, maternal grandmother, mother, sister, sister, and sister; Hypertension in her father and mother; Lung cancer in her sister; Parkinson's disease in her mother; Peripheral Artery Disease in her father; Rheum arthritis in her mother; Stroke in her mother.   Allergies Allergies  Allergen Reactions   Ceftin [Cefuroxime Axetil] Anaphylaxis    Has toleratd unasyn, ceftriaxone, zosyn   Lisinopril Anaphylaxis   Sulfa Antibiotics Other (See Comments)    Face swelling   Sulfasalazine Anaphylaxis   Morphine Other (See Comments)    Per patient, low blood pressure issues that requires action to raise it back up. Can take small infrequent doses   Xarelto [Rivaroxaban] Other (See Comments)    Stomach burning, bleeding, and tar in stool   Adhesive [Tape] Rash    Paper tape and tega derm OK   Antihistamines, Chlorpheniramine-Type Other (See Comments)    Makes pt hyper   Antivert [Meclizine Hcl] Other (See Comments)    Bladder will not empty   Aspirin Other (See Comments)    Sulfasalazine allergy cross reacts   Contrast Media [Iodinated Contrast Media] Rash    she is able to use betadine scrubs.   Decongestant [Pseudoephedrine Hcl] Other (See Comments)    Makes pt hyper   Doxycycline Other (See Comments)    GI upset   Levaquin [Levofloxacin In D5w] Rash   Polymyxin B Other (See Comments)    Facial rash   Tetanus  Toxoids Rash and Other (See Comments)    Fever and hot to touch at injection site     Home Medications  Prior to Admission medications   Medication Sig Start Date End Date Taking? Authorizing Provider  albuterol (PROVENTIL) (2.5 MG/3ML) 0.083% nebulizer solution Take 3 mLs (2.5 mg total) by nebulization every 6 (six) hours as needed for wheezing or shortness of breath. 11/17/22  Yes Salena Saner, MD  albuterol (VENTOLIN HFA) 108 (90 Base) MCG/ACT inhaler Inhale 2 puffs into the lungs every 6 (six) hours as needed for wheezing or shortness of breath. 06/24/21  Yes McLean-Scocuzza, Pasty Spillers, MD  amLODipine (NORVASC) 5 MG tablet Take 1 tablet (5 mg total) by mouth daily. Patient taking differently: Take 5 mg by mouth in the morning and at bedtime. 08/01/21  Yes McLean-Scocuzza, Pasty Spillers, MD  apixaban (ELIQUIS) 5 MG TABS tablet Take 5 mg by mouth 2 (two) times daily. 12/10/22  Yes [provider]  busPIRone (BUSPAR) 5 MG tablet Take 1 tablet (5 mg total) by mouth 2 (two) times daily. 06/02/21  Yes Lurene Shadow, MD  cetirizine (ZYRTEC) 10 MG tablet TAKE 1 TABLET BY MOUTH EVERYDAY AT BEDTIME 06/08/22  Yes Salena Saner, MD  dicyclomine (BENTYL) 10 MG capsule TAKE 1 CAPSULE (10 MG TOTAL) BY MOUTH 2 (TWO) TIMES DAILY AS NEEDED FOR SPASMS. 10/02/22  Yes Wyline Mood, MD  ENBREL SURECLICK 50 MG/ML injection Inject 50 mg into the skin once a week. Takes on Fridays   Yes [provider]  escitalopram (LEXAPRO) 10 MG tablet Take 1 tablet (10 mg total) by mouth daily. 08/01/21  Yes McLean-Scocuzza,  Pasty Spillers, MD  famotidine (PEPCID) 20 MG tablet TAKE 1 TABLET BY MOUTH EVERYDAY AT BEDTIME 10/08/22  Yes Wyline Mood, MD  Fluticasone-Umeclidin-Vilant (TRELEGY ELLIPTA) 100-62.5-25 MCG/ACT AEPB Inhale 1 puff into the lungs daily. 12/01/22  Yes Salena Saner, MD  gabapentin (NEURONTIN) 300 MG capsule Take 300 mg in the morning and 600 mg at night. 01/11/23  Yes Dana Allan, MD  GEMTESA 75 MG TABS  TAKE 75 MG BY MOUTH DAILY. 03/08/23  Yes Vaillancourt, Samantha, PA-C  lamoTRIgine (LAMICTAL) 100 MG tablet TAKE 1 TAB 2 TIMES DAILY. FURTHER REFILLS NEW PSYCH FOR ALL PSYCH MEDS ONLY TEMP SUPPLY FROM PCP 05/02/21  Yes McLean-Scocuzza, Pasty Spillers, MD  leflunomide (ARAVA) 20 MG tablet Take 1 tablet (20 mg total) by mouth daily. 09/30/17  Yes McLean-Scocuzza, Pasty Spillers, MD  lidocaine (LIDODERM) 5 % Place 1 patch onto the skin 2 (two) times daily as needed. Remove & Discard patch within 12 hours or as directed by MD 11/22/20  Yes McLean-Scocuzza, Pasty Spillers, MD  lovastatin (MEVACOR) 20 MG tablet Take 1 tablet (20 mg total) by mouth daily at 6 PM. 08/04/22  Yes Dana Allan, MD  methocarbamol (ROBAXIN) 500 MG tablet Take 500 mg by mouth 3 (three) times daily.   Yes [provider]  mometasone (NASONEX) 50 MCG/ACT nasal spray USE 1 SPRAY TO EACH NOSTRIL TWICE A DAY 10/08/22  Yes Salena Saner, MD  montelukast (SINGULAIR) 10 MG tablet TAKE 1 TABLET BY MOUTH EVERY DAY 11/12/22  Yes Salena Saner, MD  multivitamin-lutein [facility]) CAPS capsule Take 1 capsule by mouth at bedtime.   Yes [provider]  nebivolol (BYSTOLIC) 5 MG tablet Take 1 tablet (5 mg total) by mouth in the morning and at bedtime. (Note dose changed from 1/2 10 mg bid to 5 mg bid) 08/01/21  Yes McLean-Scocuzza, Pasty Spillers, MD  OHTUVAYRE 3 MG/2.5ML SUSP Inhale 1 ampule into the lungs in the morning and at bedtime. 12/25/22  Yes [provider]  pantoprazole (PROTONIX) 40 MG tablet TAKE 1 TABLET BY MOUTH 2 TIMES DAILY 30 MIN BEFORE FOOD (NOTE REDUCTION IN FREQUENCY) 02/15/23  Yes Dana Allan, MD  predniSONE (DELTASONE) 10 MG tablet Take 10 mg by mouth daily with breakfast.   Yes [provider]  QUEtiapine (SEROQUEL) 50 MG tablet Take 50 mg by mouth at bedtime. 02/10/23  Yes [provider]  sucralfate (CARAFATE) 1 g tablet TAKE 1 TABLET BY MOUTH TWICE A DAY 11/02/22  Yes Wyline Mood, MD  torsemide  (DEMADEX) 20 MG tablet Take 2 tablets (40 mg total) by mouth daily. 07/19/22 03/24/23 Yes Sreenath, Sudheer B, MD  OXYGEN Inhale 3 L into the lungs continuous.    [provider]  PROLIA 60 MG/ML SOSY injection Inject 60 mg into the skin every 6 (six) months. 03/09/22   [provider]     Critical care time: 55 minutes     Harlon Ditty, AGACNP-BC [facility] Prefer epic messenger for cross cover needs If after hours, please call E-link

## 2023-03-24 NOTE — Progress Notes (Signed)
       CROSS COVER NOTE  NAME: Kerri Carter MRN: [id number] DOB : Apr 04, 1940    Date of Service   03/24/2023   HPI/Events of Note   SOB  Interventions   Plan: X X X       Kerri Featherly Lamin Intisar Claudio, MSN, APRN, AGACNP-BC [facility] [place] Pager: [phone]. Check Amion for Availability

## 2023-03-24 NOTE — Progress Notes (Incomplete)
Pharmacy Antibiotic Note  Kerri Carter is a 83 y.o. female admitted on 03/23/2023 with wound infection/cellulitis & pneumonia.  Pharmacy has been consulted for Aztreonam & Vancomycin dosing.  Plan: *** *** q***hr per indication & renal fxn. {Assessment:21075}  Pt given Vancomycin *** mg once. Vancomycin *** mg IV Q *** hrs. Goal AUC 400-550. Expected AUC: *** SCr used: ***, Vd used: ***, BMI: ***, TBW *** kg < IBW *** kg  Pharmacy will continue to follow and will adjust abx dosing whenever warranted.  Temp (24hrs), Avg:99.1 F (37.3 C), Min:98.2 F (36.8 C), Max:99.9 F (37.7 C)   Recent Labs  Lab 03/23/23 1359 03/23/23 1854  WBC 21.1*  --   CREATININE 1.74*  --   LATICACIDVEN 2.1* 2.1*    Estimated Creatinine Clearance: 23.5 mL/min (A) (by C-G formula based on SCr of 1.74 mg/dL (H)).    Allergies  Allergen Reactions  . Ceftin [Cefuroxime Axetil] Anaphylaxis  . Lisinopril Anaphylaxis  . Sulfa Antibiotics Other (See Comments)    Face swelling  . Sulfasalazine Anaphylaxis  . Morphine Other (See Comments)    Per patient, low blood pressure issues that requires action to raise it back up. Can take small infrequent doses  . Xarelto [Rivaroxaban] Other (See Comments)    Stomach burning, bleeding, and tar in stool  . Adhesive [Tape] Rash    Paper tape and tega derm OK  . Antihistamines, Chlorpheniramine-Type Other (See Comments)    Makes pt hyper  . Antivert [Meclizine Hcl] Other (See Comments)    Bladder will not empty  . Aspirin Other (See Comments)    Sulfasalazine allergy cross reacts  . Contrast Media [Iodinated Contrast Media] Rash    she is able to use betadine scrubs.  Leilani Merl [Pseudoephedrine Hcl] Other (See Comments)    Makes pt hyper  . Doxycycline Other (See Comments)    GI upset  . Levaquin [Levofloxacin In D5w] Rash  . Polymyxin B Other (See Comments)    Facial rash  . Tetanus Toxoids Rash and Other (See Comments)    Fever and hot to touch  at injection site    Antimicrobials this admission: *** *** >> *** *** *** >> *** *** *** >> ***  Microbiology results: *** BCx: *** *** UCx: ***  *** ***Cx: *** *** Sputum: ***  *** MRSA PCR: *** No lab cx currently ordered or pending at this time.  Thank you for allowing pharmacy to be a part of this patient's care.  Otelia Sergeant, PharmD, MBA 03/24/2023 12:00 AM

## 2023-03-24 NOTE — Progress Notes (Signed)
Pharmacy Antibiotic Note  Kerri Carter is a 83 y.o. female admitted on 03/23/2023 with wound infection/cellulitis & pneumonia.  Pharmacy has been consulted for Aztreonam & Vancomycin dosing.  Plan: Pt given Vancomycin 1000 mg once 2/18 @ 1823 Ordered additional Vancomycin 1000 mg once Vancomycin 1000 mg IV Q 48 hrs. Goal AUC 400-550. Expected AUC: 460.4 SCr used: 1.35, Vd used: 0.5 BMI 35.3  Pharmacy will continue to follow and will adjust abx dosing whenever warranted.  Temp (24hrs), Avg:98.5 F (36.9 C), Min:98.2 F (36.8 C), Max:99.9 F (37.7 C)   Recent Labs  Lab 03/23/23 1359 03/23/23 1854 03/24/23 0257  WBC 21.1*  --  17.3*  CREATININE 1.74*  --  1.35*  LATICACIDVEN 2.1* 2.1*  --     Estimated Creatinine Clearance: 30.3 mL/min (A) (by C-G formula based on SCr of 1.35 mg/dL (H)).    Allergies  Allergen Reactions   Ceftin [Cefuroxime Axetil] Anaphylaxis    Has toleratd unasyn, ceftriaxone, zosyn   Lisinopril Anaphylaxis   Sulfa Antibiotics Other (See Comments)    Face swelling   Sulfasalazine Anaphylaxis   Morphine Other (See Comments)    Per patient, low blood pressure issues that requires action to raise it back up. Can take small infrequent doses   Xarelto [Rivaroxaban] Other (See Comments)    Stomach burning, bleeding, and tar in stool   Adhesive [Tape] Rash    Paper tape and tega derm OK   Antihistamines, Chlorpheniramine-Type Other (See Comments)    Makes pt hyper   Antivert [Meclizine Hcl] Other (See Comments)    Bladder will not empty   Aspirin Other (See Comments)    Sulfasalazine allergy cross reacts   Contrast Media [Iodinated Contrast Media] Rash    she is able to use betadine scrubs.   Decongestant [Pseudoephedrine Hcl] Other (See Comments)    Makes pt hyper   Doxycycline Other (See Comments)    GI upset   Levaquin [Levofloxacin In D5w] Rash   Polymyxin B Other (See Comments)    Facial rash   Tetanus Toxoids Rash and Other (See Comments)     Fever and hot to touch at injection site    Antimicrobials this admission: 2/18 Azithromycin >> x 5 days 2/18 Aztreonam x1 2/18 Vancomycin >> 2/19 Unasyn >>   Microbiology results: 2/18 BCx: NGTD 2/18 UCx: Pending   Thank you for allowing pharmacy to be a part of this patient's care.  Bettey Costa, PharmD Clinical Pharmacist 03/24/2023 10:46 AM

## 2023-03-24 NOTE — ED Notes (Signed)
 Assumed patient care and received report from the previous nurse.

## 2023-03-24 NOTE — Consult Note (Signed)
WOC Nurse Consult Note: Reason for Consult: lower extremity wound Wound type:  laceration; occurred 1/22 Sutured 1/22; does not appear any follow up for the wounds; podiatry follow up missed  Pressure Injury POA: NA Measurement:see nursing flow sheets Wound ZOX:WRUE area centrally, fibrinous, some black tissue along the suture line.   Drainage (amount, consistency, odor) serosanguinous  Periwound: intact  Dressing procedure/placement/frequency: Cleanse with saline, pat dry Cover entire wound bed with single layer of xeroform gauze, top with foam. Change every other day.   Discussed POC with patient and bedside nurse.  Re consult if needed, will not follow at this time. Thanks  Alyssamarie Mounsey [employer], RN,CWOCN, CNS, CWON-AP [phone])

## 2023-03-24 NOTE — Progress Notes (Signed)
Pharmacy Antibiotic Note  Kerri Carter is a 82 y.o. female admitted on 03/23/2023 with wound infection/cellulitis & pneumonia.  Pharmacy has been consulted for Aztreonam & Vancomycin dosing.  Plan: Pt given Aztreonam 2 gm once. Aztreonam 1 gm q8hr per indication & renal fxn.  Pt given Vancomycin 1000 mg once 2/18 @ 1823 Ordered additional Vancomycin 1000 mg once Vancomycin 500 mg IV Q 24 hrs. Goal AUC 400-550. Expected AUC: 460.4 SCr used: 1.74, Vd used: 0.72, BMI 35.3  Pharmacy will continue to follow and will adjust abx dosing whenever warranted.  Temp (24hrs), Avg:99.1 F (37.3 C), Min:98.2 F (36.8 C), Max:99.9 F (37.7 C)   Recent Labs  Lab 03/23/23 1359 03/23/23 1854  WBC 21.1*  --   CREATININE 1.74*  --   LATICACIDVEN 2.1* 2.1*    Estimated Creatinine Clearance: 23.5 mL/min (A) (by C-G formula based on SCr of 1.74 mg/dL (H)).    Allergies  Allergen Reactions   Ceftin [Cefuroxime Axetil] Anaphylaxis   Lisinopril Anaphylaxis   Sulfa Antibiotics Other (See Comments)    Face swelling   Sulfasalazine Anaphylaxis   Morphine Other (See Comments)    Per patient, low blood pressure issues that requires action to raise it back up. Can take small infrequent doses   Xarelto [Rivaroxaban] Other (See Comments)    Stomach burning, bleeding, and tar in stool   Adhesive [Tape] Rash    Paper tape and tega derm OK   Antihistamines, Chlorpheniramine-Type Other (See Comments)    Makes pt hyper   Antivert [Meclizine Hcl] Other (See Comments)    Bladder will not empty   Aspirin Other (See Comments)    Sulfasalazine allergy cross reacts   Contrast Media [Iodinated Contrast Media] Rash    she is able to use betadine scrubs.   Decongestant [Pseudoephedrine Hcl] Other (See Comments)    Makes pt hyper   Doxycycline Other (See Comments)    GI upset   Levaquin [Levofloxacin In D5w] Rash   Polymyxin B Other (See Comments)    Facial rash   Tetanus Toxoids Rash and Other (See  Comments)    Fever and hot to touch at injection site    Antimicrobials this admission: 2/18 Azithromycin >> x 5 days 2/18 Aztreonam >>  2/18 Vancomycin >>   Microbiology results: 2/18 BCx: Pending 2/18 UCx: Pending   Thank you for allowing pharmacy to be a part of this patient's care.  Otelia Sergeant, PharmD, MBA 03/24/2023 12:00 AM

## 2023-03-24 NOTE — Progress Notes (Signed)
RT called to room to check BiPAP for correct placement post removal trial. Mask adjusted for minimal leak. Pt tachypnic with rate in 30's with Vt 544 ml. Pt using resp accessory muscles. Pressures increased to 16/8 with 80% FiO2 and sat maintained in low 90's.

## 2023-03-24 NOTE — Consult Note (Signed)
Please note that the [facility] nursing team is utilizing a standardized work plan to manage patient consults. We are triaging consults and will try to see the patients within 48 hours. Wound photos in the patient's chart allow [place] to consult on the patient in the most efficient and timely manner.    Guneet Delpino [facility], CNS, [employer] [phone]

## 2023-03-24 NOTE — ED Notes (Signed)
CCMD called to inform of heart rate over 150bpm and unable to reach assigned RN's

## 2023-03-24 NOTE — Care Management (Signed)
Spoke with patients son Addalyn Speedy via phone.   Updated on plan of care.  Patient with severe respiratory failure in setting of influenza A with concommitant strep pneumonia.  Guarded prognosis.  Continue with current care plan including BPAP, antibiotics, steroids, bronchodilators.  Admit to stepdown unit.  Intensivist consulted.  Case discussed with PCCM team.  Please notify Mr Pelaez [phone] with any changes in clinical status per his request.  Day or night  Lolita Patella MD [facility]

## 2023-03-24 NOTE — Progress Notes (Signed)
PROGRESS NOTE    BECKIE VISCARDI  ONG:[id number] DOB: 25-Feb-1940 DOA: 03/23/2023 PCP: Dana Allan, MD    Brief Narrative:   83 y.o. female with medical history significant for ILD and COPD on 2-3 L oxygen, hypertension, hyperlipidemia, PVD, CAD, dCHF, depression with anxiety, bipolar, CKD 3B, A-fib on Eliquis, OSA on CPAP, obesity, who presented via EMS for evaluation of generalized weakness, altered mental status and worsening wound.  Per son, patient was doing well until Sunday she started having significant weakness and some disorientation. On Monday, there was slight improvement in her mental status. Wound care came to check patient's left ankle wound and noticed purulent drainage. Today, patient was found to have a temperature of 102 with worsening weakness so they called EMS.  On their arrival, patient had a temperature of 103. Patient reports worsening cough that have become productive over the last few days. She also endorsed left flank pain, fatigue, chills and poor appetite but denies any nausea, vomiting, abdominal pain, chest pain, dizziness, palpitations, dysuria, or diarrhea. Son reports that multiple family members have the flu at home.    Assessment & Plan:   Principal Problem:   Severe sepsis (HCC) Active Problems:   Cellulitis of left lower leg   Hypokalemia   Influenza A   Multifocal pneumonia  # Severe sepsis Elderly patient presented with generalized weakness, chills, cough and worsening left foot infection after a recent hospitalization found to have multifocal pneumonia in the setting of influenza A infection. Patient met severe sepsis criteria with tachycardia, fever, leukocytosis, and elevated lactic acid in the setting of her respiratory and skin infections resulting in acute kidney injury. She is hemodynamically stable on IV fluids and IV antibiotics. Plan: Stop fluids Continue vancomycin Switch to Unasyn Discontinue aztreonam Follow blood and urine  cultures Monitor vitals and fever curve   # Multifocal pneumonia # Influenza A infection Patient with a history of COPD with a chronic cough who was exposed to influenza infection last week found to have a productive cough, fever and leukocytosis. RVP positive for influenza A PCR. CXR shows evidence of multifocal pneumonia. She stable from respiratory standpoint on only 2 L [place].  Patient with recent hospital contact so we will continue current antibiotics. Tamiflu unlikely to be effective at this point of her infection. Plan: IV antibiotics as above Add azithromycin Follow urinary infectious assays Likely no strong role for Tamiflu at this time  # Cellulitis of the left lower extremity Patient recently discharged after a brief admission for cellulitis of her left lower extremity now presenting with worsening left leg wound and some purulent drainage noticed by family at home. Patient met sepsis criteria on admission but remains at baseline mental status. Plan: Antibiotic as above -Wound care consulted, appreciate recs   # COPD # Chronic hypoxic respiratory failure Patient on baseline 2 to 3 L [place] at home. She reports a chronic cough for her COPD and has become slightly more productive. She denies any shortness of breath and currently on her baseline 2 L [place]. Mild expiratory wheezing on exam. -Continue supplemental O2 -Continue home bronchodilators -Continue Singulair -As needed DuoNebs   # AKI on CKD 3B Baseline creatinine seems to be around 1.1-1.2. Creatinine bumped to 1.74 on admission in the setting of dehydration from recent poor p.o. intake and infection. -Continue IV hydration as above -Avoid nephrotoxic's agents -Trend renal function   # HFpEF Last TTE in February 2024 showed EF 55 to 65%, G2 DD. Patient slightly dry  on exam. -Hold home torsemide today in the setting of AKI and volume depletion -Continue supplemental O2   # Hypokalemia Has a history of hypokalemia likely  due to diuretic. K+ down to 2.8 on admission. Has received a total of 60 mEq of KCl in the ED. -Monitor and replace as needed   # HTN BP stable in the 100s to 130s. Will hold amlodipine today in the setting of volume depletion and resume tomorrow. -Continue amlodipine and nebivolol   # A-fib Patient tachycardic with HR in the 100s to 110s. Reports she has not taken any of her meds today. -Continue Eliquis and nebivolol   # Rheumatoid arthritis -Continue home leflunomide   # HLD -Continue pravastatin (substitute for lovastatin)   # Bipolar disorder -Continue Lamictal, Lexapro and BuSpar   # OSA -Continue CPAP at night   # Generalized weakness In the setting of recent infection and poor p.o. intake. -Start Ensure twice daily -PT/OT eval and treat   # Obesity Body mass index is 35.35 kg/m. -Weight loss and nutrition counseling with PCP in the outpatient   DVT prophylaxis: Eliquis Code Status: DNR Family Communication: None today Disposition Plan: Status is: Inpatient Remains inpatient appropriate because: Multiple acute issues as above   Level of care: Progressive  Consultants:  None  Procedures:  None  Antimicrobials: Vancomycin Unasyn    Subjective: Seen and examined.  Mentating clearly.  Still work of breathing improved but still endorses shortness of breath.  Objective: Vitals:   03/24/23 1430 03/24/23 1500 03/24/23 1530 03/24/23 1559  BP: 121/72 139/81 136/85 136/85  Pulse: (!) 101 (!) 103 (!) 104 (!) 104  Resp:    (!) 34  Temp:    98.1 F (36.7 C)  TempSrc:      SpO2: 91% (!) 88% (!) 89% 92%  Weight:      Height:        Intake/Output Summary (Last 24 hours) at 03/24/2023 1607 Last data filed at 03/24/2023 0345 Gross per 24 hour  Intake 640.87 ml  Output --  Net 640.87 ml   Filed Weights   03/23/23 1357  Weight: 79.4 kg    Examination:  General exam: Appears calm and comfortable  Respiratory system: Coarse breath sounds  bilaterally.  Normal work of breathing.  BiPAP Cardiovascular system: S1-S2, RRR, no murmurs, no pedal edema Gastrointestinal system: Obese, soft, NT/ND, normal bowel sound Central nervous system: Alert and oriented. No focal neurological deficits. Extremities: Symmetric 5 x 5 power. Skin: No rashes, lesions or ulcers Psychiatry: Judgement and insight appear normal. Mood & affect appropriate.     Data Reviewed: I have personally reviewed following labs and imaging studies  CBC: Recent Labs  Lab 03/23/23 1359 03/24/23 0257  WBC 21.1* 17.3*  NEUTROABS 15.7*  --   HGB 9.5* 8.1*  HCT 30.3* 25.9*  MCV 85.4 84.6  PLT 254 247   Basic Metabolic Panel: Recent Labs  Lab 03/23/23 1359 03/24/23 0257  NA 136 135  K 2.8* 3.4*  CL 96* 102  CO2 25 23  GLUCOSE 158* 158*  BUN 27* 24*  CREATININE 1.74* 1.35*  CALCIUM 8.2* 7.6*  MG  --  1.7  PHOS  --  2.4*   GFR: Estimated Creatinine Clearance: 30.3 mL/min (A) (by C-G formula based on SCr of 1.35 mg/dL (H)). Liver Function Tests: Recent Labs  Lab 03/23/23 1359 03/24/23 0257  AST 25  --   ALT 18  --   ALKPHOS 73  --  BILITOT 1.0  --   PROT 7.2  --   ALBUMIN 2.8* 2.3*   No results for input(s): "LIPASE", "AMYLASE" in the last 168 hours. No results for input(s): "AMMONIA" in the last 168 hours. Coagulation Profile: Recent Labs  Lab 03/23/23 1359  INR 1.6*   Cardiac Enzymes: No results for input(s): "CKTOTAL", "CKMB", "CKMBINDEX", "TROPONINI" in the last 168 hours. BNP (last 3 results) No results for input(s): "PROBNP" in the last 8760 hours. HbA1C: No results for input(s): "HGBA1C" in the last 72 hours. CBG: No results for input(s): "GLUCAP" in the last 168 hours. Lipid Profile: No results for input(s): "CHOL", "HDL", "LDLCALC", "TRIG", "CHOLHDL", "LDLDIRECT" in the last 72 hours. Thyroid Function Tests: No results for input(s): "TSH", "T4TOTAL", "FREET4", "T3FREE", "THYROIDAB" in the last 72 hours. Anemia  Panel: No results for input(s): "VITAMINB12", "FOLATE", "FERRITIN", "TIBC", "IRON", "RETICCTPCT" in the last 72 hours. Sepsis Labs: Recent Labs  Lab 03/23/23 1359 03/23/23 1854  PROCALCITON 0.76  --   LATICACIDVEN 2.1* 2.1*    Recent Results (from the past 240 hours)  Culture, blood (Routine x 2)     Status: None (Preliminary result)   Collection Time: 03/23/23  1:59 PM   Specimen: BLOOD  Result Value Ref Range Status   Specimen Description BLOOD BLOOD RIGHT WRIST  Final   Special Requests   Final    BOTTLES DRAWN AEROBIC AND ANAEROBIC Blood Culture results may not be optimal due to an inadequate volume of blood received in culture bottles   Culture   Final    NO GROWTH < 24 HOURS Performed at [facility], [address] [place], [place] [address]    Report Status PENDING  Incomplete  Culture, blood (Routine x 2)     Status: None (Preliminary result)   Collection Time: 03/23/23  4:12 PM   Specimen: BLOOD  Result Value Ref Range Status   Specimen Description BLOOD RIGHT ANTECUBITAL  Final   Special Requests   Final    BOTTLES DRAWN AEROBIC AND ANAEROBIC Blood Culture results may not be optimal due to an inadequate volume of blood received in culture bottles   Culture   Final    NO GROWTH < 12 HOURS Performed at [facility], [address], [place], [place] [address]    Report Status PENDING  Incomplete  Resp panel by RT-PCR (RSV, Flu A&B, Covid) Anterior Nasal Swab     Status: Abnormal   Collection Time: 03/23/23  4:12 PM   Specimen: Anterior Nasal Swab  Result Value Ref Range Status   SARS Coronavirus 2 by RT PCR NEGATIVE NEGATIVE Final    Comment: (NOTE) SARS-CoV-2 target nucleic acids are NOT DETECTED.  The SARS-CoV-2 RNA is generally detectable in upper respiratory specimens during the acute phase of infection. The lowest concentration of SARS-CoV-2 viral copies this assay can detect is 138 copies/mL. A negative result does not preclude  SARS-Cov-2 infection and should not be used as the sole basis for treatment or other patient management decisions. A negative result may occur with  improper specimen collection/handling, submission of specimen other than nasopharyngeal swab, presence of viral mutation(s) within the areas targeted by this assay, and inadequate number of viral copies(<138 copies/mL). A negative result must be combined with clinical observations, patient history, and epidemiological information. The expected result is Negative.  Fact Sheet for Patients:  BloggerCourse.com  Fact Sheet for Healthcare Providers:  SeriousBroker.it  This test is no t yet approved or cleared by the [place] FDA and  has been authorized for detection and/or diagnosis of SARS-CoV-2 by FDA under an Emergency Use Authorization (EUA). This EUA will remain  in effect (meaning this test can be used) for the duration of the COVID-19 declaration under Section 564(b)(1) of the Act, 21 U.S.C.section 360bbb-3(b)(1), unless the authorization is terminated  or revoked sooner.       Influenza A by PCR POSITIVE (A) NEGATIVE Final   Influenza B by PCR NEGATIVE NEGATIVE Final    Comment: (NOTE) The Xpert Xpress SARS-CoV-2/FLU/RSV plus assay is intended as an aid in the diagnosis of influenza from Nasopharyngeal swab specimens and should not be used as a sole basis for treatment. Nasal washings and aspirates are unacceptable for Xpert Xpress SARS-CoV-2/FLU/RSV testing.  Fact Sheet for Patients: BloggerCourse.com  Fact Sheet for Healthcare Providers: SeriousBroker.it  This test is not yet approved or cleared by the [place] FDA and has been authorized for detection and/or diagnosis of SARS-CoV-2 by FDA under an Emergency Use Authorization (EUA). This EUA will remain in effect (meaning this test can be used) for the duration of  the COVID-19 declaration under Section 564(b)(1) of the Act, 21 U.S.C. section 360bbb-3(b)(1), unless the authorization is terminated or revoked.     Resp Syncytial Virus by PCR NEGATIVE NEGATIVE Final    Comment: (NOTE) Fact Sheet for Patients: BloggerCourse.com  Fact Sheet for Healthcare Providers: SeriousBroker.it  This test is not yet approved or cleared by the [place] FDA and has been authorized for detection and/or diagnosis of SARS-CoV-2 by FDA under an Emergency Use Authorization (EUA). This EUA will remain in effect (meaning this test can be used) for the duration of the COVID-19 declaration under Section 564(b)(1) of the Act, 21 U.S.C. section 360bbb-3(b)(1), unless the authorization is terminated or revoked.  Performed at [facility], [address] [place], [place] [address]   Expectorated Sputum Assessment w Gram Stain, Rflx to Resp Cult     Status: None   Collection Time: 03/23/23  9:29 PM   Specimen: Sputum  Result Value Ref Range Status   Specimen Description SPUTUM  Final   Special Requests NONE  Final   Sputum evaluation   Final    THIS SPECIMEN IS ACCEPTABLE FOR SPUTUM CULTURE Performed at [facility], [address] [place], [place] [address]    Report Status 03/23/2023 FINAL  Final  Culture, Respiratory w Gram Stain     Status: None (Preliminary result)   Collection Time: 03/23/23  9:29 PM   Specimen: SPU  Result Value Ref Range Status   Specimen Description   Final    SPUTUM Performed at [facility], [address], [place], [place] [address]    Special Requests   Final    NONE Reflexed from [phone] Performed at [facility], [address] [place], [place] [address]    Gram Stain   Final    FEW WBC PRESENT,BOTH PMN AND MONONUCLEAR FEW GRAM POSITIVE COCCI IN PAIRS FEW YEAST Performed at [facility], [address] [place], [place]  [address]    Culture PENDING  Incomplete   Report Status PENDING  Incomplete         Radiology Studies: DG Chest Port 1 View Result Date: 03/24/2023 CLINICAL DATA:  83 year old female with shortness of breath, hypoxia. EXAM: PORTABLE CHEST 1 VIEW COMPARISON:  Chest radiographs yesterday and earlier. FINDINGS: Portable AP upright view at 0642 hours. Progressive, now moderate to large area of abnormal  airspace opacity in the left lower lung. Larger lung volumes compared to yesterday. Stable cardiac size and mediastinal contours. No pneumothorax. Chronic right upper rib deformities which appears to account for the stable asymmetric right upper lung opacity. Small left pleural effusion not excluded. No pneumothorax or pulmonary edema. Bilateral shoulder arthroplasty. Paucity of bowel gas in the visible abdomen. IMPRESSION: 1. Increasing left mid and lower lung airspace opacity since yesterday compatible with Worsening Pneumonia. Possible small pleural effusion(s). 2. Chronic right upper rib deformities felt to account for asymmetric right upper lung opacity. Electronically Signed   By: Odessa Fleming M.D.   On: 03/24/2023 06:56   DG Ankle Complete Left Result Date: 03/23/2023 CLINICAL DATA:  Wound infection. Purulence drainage from anterior ankle. EXAM: LEFT ANKLE COMPLETE - 3+ VIEW COMPARISON:  Tibia/fibula radiograph 02/24/2023 FINDINGS: There is no evidence of fracture, dislocation, or joint effusion. Ankle mortise is preserved. No erosion or periostitis. Anterior soft tissue edema. No radiopaque foreign body or soft tissue gas. IMPRESSION: Anterior soft tissue edema. No radiographic evidence of osteomyelitis. Electronically Signed   By: Narda Rutherford M.D.   On: 03/23/2023 18:27   DG Chest 2 View Result Date: 03/23/2023 CLINICAL DATA:  Suspected Sepsis Pain. EXAM: CHEST - 2 VIEW COMPARISON:  Chest radiograph 02/11/2023 FINDINGS: Patient's chin obscures the apex. Stable heart size and mediastinal contours,  mild cardiomegaly. Multifocal lung opacities, greatest in the bases, suspicious for pneumonia. No pneumothorax or large pleural effusion. Exaggerated thoracic kyphosis. Bilateral reverse shoulder arthroplasties. Remote bilateral rib fractures. IMPRESSION: Patchy multifocal lung opacities, greatest in the bases, suspicious for multifocal pneumonia. Electronically Signed   By: Narda Rutherford M.D.   On: 03/23/2023 16:34        Scheduled Meds:  amLODipine  5 mg Oral BID   apixaban  5 mg Oral BID   busPIRone  5 mg Oral BID   escitalopram  10 mg Oral Daily   feeding supplement  237 mL Oral BID   fluticasone furoate-vilanterol  1 puff Inhalation Daily   And   umeclidinium bromide  1 puff Inhalation Daily   Gerhardt's butt cream   Topical TID   lamoTRIgine  100 mg Oral BID   leflunomide  20 mg Oral Daily   montelukast  10 mg Oral QHS   multivitamin  1 tablet Oral Daily   nebivolol  5 mg Oral BID   pravastatin  20 mg Oral q1800   Continuous Infusions:  ampicillin-sulbactam (UNASYN) IV Stopped (03/24/23 1426)   azithromycin Stopped (03/24/23 0043)   potassium chloride 10 mEq (03/24/23 1555)   [START ON 03/25/2023] vancomycin       LOS: 1 day    Tresa Moore, MD [facility]   If 7PM-7AM, please contact night-coverage  03/24/2023, 4:07 PM

## 2023-03-24 NOTE — Progress Notes (Signed)
OT Cancellation Note  Patient Details Name: Kerri Carter MRN: [id number] DOB: [birth year]   Cancelled Treatment:    Reason Eval/Treat Not Completed: Medical issues which prohibited therapy. Chart reviewed, messaged nurse who reports pt is still on bipap this afternoon. OT will hold eval today and follow up tomorrow/as medically stable.  Constance Goltz 03/24/2023, 2:14 PM

## 2023-03-24 NOTE — Progress Notes (Signed)
PT Cancellation Note  Patient Details Name: KYERA FELAN MRN: [id number] DOB: 03-22-1940   Cancelled Treatment:    Reason Eval/Treat Not Completed: Medical issues which prohibited therapy  Orders received, chart reviewed. Patient continues to remain on BiPAP per paramedic. Will hold at this time and will follow up once medically appropriate.   Maylon Peppers, PT, DPT Physical Therapist - [facility]  [facility]    Lief Palmatier A Dave Mannes 03/24/2023, 2:07 PM

## 2023-03-24 NOTE — Progress Notes (Signed)
       CROSS COVER NOTE  NAME: Kerri Carter MRN: [id number] DOB : October 11, 1940    Date of Service   03/24/2023   HPI/Events of Note   Nurse reported worsening shortness of breath this a.m.  Patient was on 6 L of nasal cannula and was satting in the mid 80s.  Patient was placed on high flow nasal cannula by RT but see remained tachypneic and feeling short of breath.  Patient has fine crackles on auscultation  Interventions   -Patient placed on BiPAP -Obtained an ABG-no signs of acute respiratory failure -Will obtain CXR.  Please follow in the a.m. and diurese if necessary.     Romaine Maciolek Lamin Geradine Girt, MSN, APRN, AGACNP-BC [facility] [place] Pager: [phone]. Check Amion for Availability

## 2023-03-25 ENCOUNTER — Other Ambulatory Visit: Payer: Self-pay

## 2023-03-25 ENCOUNTER — Inpatient Hospital Stay: Payer: Medicare PPO

## 2023-03-25 ENCOUNTER — Inpatient Hospital Stay (HOSPITAL_COMMUNITY)
Admit: 2023-03-25 | Discharge: 2023-03-25 | Disposition: A | Discharge status: Home / Self Care | Payer: Medicare PPO | Attending: Pulmonary Disease | Admitting: Pulmonary Disease

## 2023-03-25 DIAGNOSIS — R000 Tachycardia, unspecified: Secondary | ICD-10-CM | POA: Diagnosis not present

## 2023-03-25 DIAGNOSIS — R0603 Acute respiratory distress: Secondary | ICD-10-CM

## 2023-03-25 DIAGNOSIS — Z7189 Other specified counseling: Secondary | ICD-10-CM

## 2023-03-25 DIAGNOSIS — I447 Left bundle-branch block, unspecified: Secondary | ICD-10-CM | POA: Diagnosis not present

## 2023-03-25 DIAGNOSIS — A419 Sepsis, unspecified organism: Secondary | ICD-10-CM | POA: Diagnosis not present

## 2023-03-25 DIAGNOSIS — R6520 Severe sepsis without septic shock: Secondary | ICD-10-CM | POA: Diagnosis not present

## 2023-03-25 LAB — RENAL FUNCTION PANEL
Albumin: 2.2 g/dL — ABNORMAL LOW (ref 3.5–5.0)
Anion gap: 16 — ABNORMAL HIGH (ref 5–15)
BUN: 31 mg/dL — ABNORMAL HIGH (ref 8–23)
CO2: 20 mmol/L — ABNORMAL LOW (ref 22–32)
Calcium: 7.3 mg/dL — ABNORMAL LOW (ref 8.9–10.3)
Chloride: 103 mmol/L (ref 98–111)
Creatinine, Ser: 1.62 mg/dL — ABNORMAL HIGH (ref 0.44–1.00)
GFR, Estimated: 32 mL/min — ABNORMAL LOW (ref >60)
Glucose, Bld: 241 mg/dL — ABNORMAL HIGH (ref 70–99)
Phosphorus: 3.8 mg/dL (ref 2.5–4.6)
Potassium: 4.3 mmol/L (ref 3.5–5.1)
Sodium: 139 mmol/L (ref 135–145)

## 2023-03-25 LAB — CBC
HCT: 29.8 % — ABNORMAL LOW (ref 36.0–46.0)
Hemoglobin: 9.3 g/dL — ABNORMAL LOW (ref 12.0–15.0)
MCH: 26.4 pg (ref 26.0–34.0)
MCHC: 31.2 g/dL (ref 30.0–36.0)
MCV: 84.7 fL (ref 80.0–100.0)
Platelets: 322 10*3/uL (ref 150–400)
RBC: 3.52 MIL/uL — ABNORMAL LOW (ref 3.87–5.11)
RDW: 15.9 % — ABNORMAL HIGH (ref 11.5–15.5)
WBC: 21 10*3/uL — ABNORMAL HIGH (ref 4.0–10.5)
nRBC: 1.3 % — ABNORMAL HIGH (ref 0.0–0.2)

## 2023-03-25 LAB — MAGNESIUM: Magnesium: 1.7 mg/dL (ref 1.7–2.4)

## 2023-03-25 LAB — ECHOCARDIOGRAM COMPLETE
AR max vel: 1.12 cm2
AV Area VTI: 1.15 cm2
AV Area mean vel: 1.05 cm2
AV Mean grad: 19 mm[Hg]
AV Peak grad: 34.5 mm[Hg]
Ao pk vel: 2.94 m/s
Area-P 1/2: 4.49 cm2
Height: 59 in
MV VTI: 1.84 cm2
S' Lateral: 2.5 cm
Weight: 2800 [oz_av]

## 2023-03-25 LAB — LACTIC ACID, PLASMA: Lactic Acid, Venous: 3 mmol/L (ref 0.5–1.9)

## 2023-03-25 LAB — GLUCOSE, CAPILLARY: Glucose-Capillary: 269 mg/dL — ABNORMAL HIGH (ref 70–99)

## 2023-03-25 LAB — MRSA NEXT GEN BY PCR, NASAL: MRSA by PCR Next Gen: NOT DETECTED

## 2023-03-25 LAB — PROCALCITONIN: Procalcitonin: 3.18 ng/mL

## 2023-03-25 MED ORDER — MORPHINE SULFATE (PF) 2 MG/ML IV SOLN: 2.0000 mg | Freq: Once | INTRAVENOUS | Status: DC

## 2023-03-25 MED ORDER — CHLORHEXIDINE GLUCONATE CLOTH 2 % EX PADS
6.0000 | MEDICATED_PAD | Freq: Every day | CUTANEOUS | Status: DC
Start: 2023-03-25 — End: 2023-03-26
  Administered 2023-03-25: 6 via TOPICAL

## 2023-03-25 MED ORDER — APIXABAN 2.5 MG PO TABS
2.5000 mg | ORAL_TABLET | Freq: Two times a day (BID) | ORAL | Status: DC
  Administered 2023-03-25: 2.5 mg via ORAL
  Filled 2023-03-25: qty 1

## 2023-03-25 MED ORDER — VANCOMYCIN HCL 750 MG/150ML IV SOLN
750.0000 mg | INTRAVENOUS | Status: DC
  Filled 2023-03-25: qty 150

## 2023-03-25 MED ORDER — MORPHINE SULFATE (PF) 2 MG/ML IV SOLN
1.0000 mg | INTRAVENOUS | Status: DC | PRN
  Administered 2023-03-25 (×2): 1 mg via INTRAVENOUS
  Filled 2023-03-25 (×5): qty 1

## 2023-03-25 MED ORDER — VANCOMYCIN HCL IN DEXTROSE 1-5 GM/200ML-% IV SOLN: 1000.0000 mg | INTRAVENOUS | Status: DC

## 2023-03-25 MED ORDER — MORPHINE SULFATE (PF) 2 MG/ML IV SOLN
1.0000 mg | Freq: Once | INTRAVENOUS | Status: AC
  Administered 2023-03-25: 1 mg via INTRAVENOUS

## 2023-03-25 NOTE — Plan of Care (Signed)
?  Problem: Education: ?Goal: Knowledge of General Education information will improve ?Description: Including pain rating scale, medication(s)/side effects and non-pharmacologic comfort measures ?Outcome: Progressing ?  ?Problem: Health Behavior/Discharge Planning: ?Goal: Ability to manage health-related needs will improve ?Outcome: Progressing ?  ?Problem: Clinical Measurements: ?Goal: Ability to maintain clinical measurements within normal limits will improve ?Outcome: Progressing ?  ?Problem: Coping: ?Goal: Level of anxiety will decrease ?Outcome: Progressing ?  ?Problem: Safety: ?Goal: Ability to remain free from injury will improve ?Outcome: Progressing ?  ?

## 2023-03-25 NOTE — Progress Notes (Signed)
NAME:  Kerri Carter, MRN:  [id number], DOB:  [birth year], LOS: 2 ADMISSION DATE:  03/23/2023, CONSULTATION DATE:  03/24/2023 REFERRING MD:  Dr. Georgeann Oppenheim, CHIEF COMPLAINT:  AMS, worsening LE wound, generalized weakness.   Brief Pt Description / Synopsis:  83 y.o. female, DNR/DNI, with PMHx significant for ILD, COPD on 2-3 L supplemental oxygen at baseline, OSA on CPAP, HTN, HLD, HFpEF, CAD, CKD Stage IIIb, A. Fib on Eliquis,  who is admitted with Acute Metabolic Encephalopathy, Severe Sepsis, Acute on Chronic Hypoxic Respiratory Failure in the setting of Influenza A infection, Streptococcus Pneumoniae pneumonia, pulmonary edema, and AECOPD requiring BiPAP.  Also with AKI and concern for cellulitis of the left lower extremity.  History of Present Illness:  Kerri Carter is a 83 y.o. female with past medical history significant for ILD and COPD on 2-3 L oxygen, hypertension, hyperlipidemia, PVD, CAD, dCHF, depression with anxiety, bipolar, CKD 3B, A-fib on Eliquis, OSA on CPAP, obesity, who presented via EMS to [facility] ED on 03/23/23 for evaluation of generalized weakness, altered mental status and worsening wound.  Per son, patient was doing well until Sunday she started having significant weakness and some disorientation. On Monday, there was slight improvement in her mental status. Wound care came to check patient's left ankle wound and noticed purulent drainage. Today, patient was found to have a temperature of 102 with worsening weakness so they called EMS.  On their arrival, patient had a temperature of 103. Patient reports worsening cough that have become productive over the last few days. She also endorsed left flank pain, fatigue, chills and poor appetite but denies any nausea, vomiting, abdominal pain, chest pain, dizziness, palpitations, dysuria, or diarrhea. Son reports that multiple family members have the flu at home.   She was admitted by the hospitalist for further workup and treatment.  Please  see "significant hospital events" section below for full detailed hospital course.  Pertinent  Medical History   Past Medical History:  Diagnosis Date   Acromial process of scapula fracture 12/21/2019   Acute kidney injury superimposed on chronic kidney disease (HCC) 05/26/2021   Acute on chronic combined systolic (congestive) and diastolic (congestive) heart failure (HCC) 07/28/2021   Acute on chronic combined systolic and diastolic CHF (congestive heart failure) (HCC) 05/26/2021   Acute on chronic respiratory failure with hypoxia (HCC) 09/10/2021   Acute pain of left knee 12/18/2020   Anemia in stage 3a chronic kidney disease (HCC) 10/09/2021   Anxiety    Anxiety 01/20/2019   Anxiety and depression 05/26/2021   Aortic stenosis 07/09/2015   a.) TTE 07/06/2015: EF 55-60%; mild AS with MPG 13.0 mmHg.   Arrhythmia    atrial fibrillation   Assistance needed for mobility 12/18/2020   Asthma    At high risk for falls 03/16/2019   Avascular necrosis of left shoulder due to adverse effect of steroid therapy (HCC) 01/20/2018   Bipolar disorder (HCC)    C. difficile diarrhea 2010   a.) following ABX course during hospital admission   Carotid atherosclerosis, bilateral    a.) Moderate; < 50% stenosis BILATERAL ICAs.   Cataract    a.) s/p BILATERAL extraction   CHF (congestive heart failure) (HCC)    Chicken pox    Chronic left shoulder pain 08/29/2019   Chronic respiratory failure with hypoxia (HCC) 10/01/2021   CKD (chronic kidney disease), stage III (HCC)    a. s/p R nephrectomy./ aneurysm   Closed fracture of acromial process of scapula 10/28/2018   Closed  nondisplaced fracture of proximal phalanx of lesser toe of right foot    Collapsed vertebra, not elsewhere classified, lumbar region, initial encounter for fracture (HCC) 02/21/2019   Community acquired pneumonia 07/22/2021   Complicated grief 03/16/2019   Compression fracture of L4 vertebra (HCC) 02/21/2019   Conversion  disorder    Conversion disorder 08/04/2015   COPD (chronic obstructive pulmonary disease) (HCC)    COPD with exacerbation (HCC) 08/20/2021   Cough 05/02/2012   Depression    Dislocation of hip joint prosthesis (HCC) 01/08/2017   Elevated brain natriuretic peptide (BNP) level 12/18/2020   Elevated d-dimer 05/26/2021   Episodic mood disorder (HCC) 04/18/2019   Essential hypertension    Fatigue 07/07/2018   Frequent falls 12/18/2020   GERD (gastroesophageal reflux disease)    H/O cardiac catheterization 02/24/2011   Formatting of this note might be different from the original.  Repeated at [facility] 2/13 and insignificant disease   Hav (hallux abducto valgus), unspecified laterality 12/05/2018   Heart murmur    Hip fracture (HCC) 10/31/2016   Hip pain 12/18/2020   History of fracture of left hip 01/15/2017   History of syncope 08/25/2016   Hyperkalemia 05/26/2021   - The patient was given p.o. Lokelma and IV Lasix.  - We will follow potassium level.   Hyperlipidemia    Hypokalemia 08/25/2016   ILD (interstitial lung disease) (HCC)    mild; 2/2 RA diagnosis   Iliopsoas bursitis of right hip 08/29/2019   Inflammatory arthritis    a. hands/carpal tunnel.  b. Low titer rheumatoid factor. c. Negative anti-CCP antibodies. d. Plaquenil.   Insomnia 07/17/2015   Intractable nausea and vomiting 08/10/2022   Left foot pain 10/19/2016   Left lower lobe pneumonia 05/26/2021   Leg edema 05/02/2019   Leg pain, bilateral    Leg swelling 12/15/2016   Leukocytosis 10/19/2017   Leukopenia 06/24/2021   Lymphocytosis 07/13/2021   Macular degeneration    Microcytic anemia 08/15/2015   Mixed bipolar I disorder (HCC) 10/09/2015   Nocturnal hypoxemia    Nocturnal hypoxemia 09/09/2011   June 2013 overnight oximetry on 6 centimeters of water CPAP: SpO2 less than 88% 72% of the time   Non-Obstructive CAD    a. 07/2009 Cath ([facility]): nonobs dzs;  b. 03/2011 Cath [facility]): nonobs dzs.   NSIP (nonspecific  interstitial pneumonitis) (HCC) 12/08/2019   Osteoarthritis    a. Knees.   Other shoulder lesions, left shoulder 12/20/2017   Other specified postprocedural states 07/20/2013   Overweight (BMI 25.0-29.9) 08/29/2019   PAD (peripheral artery disease) (HCC)    Pain due to onychomycosis of toenails of both feet 12/05/2018   Pelvic mass in female 03/16/2019   02/21/19 CT pelvis w/o contrast  Cystic left adnexal lesion has enlarged since 2018 where it  measured approximately 1.8 x 1.7 cm. This is not well assessed due  to streak artifact as well as lack of contrast. Ultrasound follow-up  may be helpful in 8-12 weeks      04/05/19 TVUS     IMPRESSION:  Simple appearing cystic structure within the left ovary measuring up  to 2.9 cm. While this has enlarged s   Physical deconditioning 02/27/2021   Pleural effusion on left 12/18/2020   PUD (peptic ulcer disease)    Recurrent falls 09/01/2021   Recurrent pneumonia 07/23/2021   Repeated falls 12/18/2020   Respiratory failure with hypoxia (HCC) 01/12/2021   Rotator cuff tendinitis, right 07/26/2017   S/P lumbar fusion 05/23/2021   S/P  right hip fracture    11/01/16 s/p repair   Sepsis due to pneumonia (HCC) 07/22/2021   Shoulder pain    Sleep apnea    no cpap / minimal   SOB (shortness of breath) 10/13/2011   2011 [facility] eval >> LHC (non-obstructive CAD), RHC (wedge 13, mean PA 26), TTE (LVEF > 55%, mild concentric LVH, mild AR, MR, PR, TR), CTAngio chest unrevealing; "well controlled asthma"  02/2011 TTE [facility]>> NL LVEF, Mild LVH, Mod AR/Mod MR   Spinal stenosis at L4-L5 level    severe with L4/L5 anterolisthesis grade 1 anterolisthesis    Spinal stenosis of lumbar region 09/15/2016   Last Assessment & Plan:   Now post op with continued back and leg pain noted. Is working with pt/ot and surgery was said to have no complications. Bowels are moving and general pain and anxiety is basically baseline.    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   Weakness of both lower extremities 12/18/2020    Micro Data:  2/18: SARS-CoV-2/RSV/flu PCR>> + influenza A 2/18: Strep pneumo urinary antigen + 2/18: Legionella urinary antigen>> 2/18: Blood culture x 2>> no growth to date 2/18: Urine>> 2/18: Sputum>> 2/18: MRSA PCR>> 2/18: Fungitell>>  Antimicrobials:   Anti-infectives (From admission, onward)    Start     Dose/Rate Route Frequency Ordered Stop   03/20/2023 1800  vancomycin (VANCOCIN) IVPB 1000 mg/200 mL premix       Placed in "Followed by" Linked Group   1,000 mg 200 mL/hr over 60 Minutes Intravenous Every 48 hours 03/24/23 1045     03/24/23 1900  cefTRIAXone (ROCEPHIN) 2 g in sodium chloride 0.9 % 100 mL IVPB        2 g 200 mL/hr over 30 Minutes Intravenous Every 24 hours 03/24/23 1851     03/24/23 1800  vancomycin (VANCOREADY) IVPB 500 mg/100 mL  Status:  Discontinued       Placed in "Followed by" Linked Group   500 mg 100 mL/hr over 60 Minutes Intravenous Every 24 hours 03/24/23 0114 03/24/23 1045   03/24/23 1200  aztreonam (AZACTAM) 2 g in sodium chloride 0.9 % 100 mL IVPB  Status:  Discontinued        2 g 200 mL/hr over 30 Minutes Intravenous Every 8 hours 03/24/23 1030 03/24/23 1031   03/24/23 1200  Ampicillin-Sulbactam (UNASYN) 3 g in sodium chloride 0.9 % 100 mL IVPB  Status:  Discontinued        3 g 200 mL/hr over 30 Minutes Intravenous Every 6 hours 03/24/23 1031 03/24/23 1851    03/24/23 0200  vancomycin (VANCOCIN) IVPB 1000 mg/200 mL premix       Placed in "Followed by" Linked Group   1,000 mg 200 mL/hr over 60 Minutes Intravenous  Once 03/24/23 0114 03/24/23 0345   03/23/23 2200  azithromycin (ZITHROMAX) 500 mg in sodium chloride 0.9 % 250 mL IVPB        500 mg  250 mL/hr over 60 Minutes Intravenous Every 24 hours 03/23/23 1945 03/28/23 2159   03/23/23 1615  aztreonam (AZACTAM) 2 g in sodium chloride 0.9 % 100 mL IVPB        2 g 200 mL/hr over 30 Minutes Intravenous  Once 03/23/23 1609 03/23/23 1702   03/23/23 1615  vancomycin (VANCOCIN) IVPB 1000 mg/200 mL premix        1,000 mg 200 mL/hr over 60 Minutes Intravenous  Once 03/23/23 1609 03/23/23 1923       Significant Hospital Events: Including procedures, antibiotic start and stop dates in addition to other pertinent events   2/18: Admitted by hospitalist.  Overnight with worsening shortness of breath and increasing FiO2 requirements requiring placement of BiPAP. 2/19: Remains BiPAP dependent.  Concern for development of pulmonary edema, given 40 mg IV Lasix x 2 doses.  PCCM consulted. Strep Pneumo urinary antigen came back positive.  Consult Palliative Care to assist with GOC. 2/20: Overnight with worsening SOB and increasing FiO2 requirements (now 100% on BiPAP), was given 80 mg IV Lasix x1. Hold on further Lasix for now given worsening Creatinine.  Will trial HHFNC per her request.  Interim History / Subjective:  -Overnight developed worsening SOB and hypoxia, BiPAP titrated up to 100% FiO2, was given 80 mg Lasix, no UOP documented and Creatinine increased to 1.6 ~ will hold off on further diuresis this morning -This morning is awake and alert on BiPAP, requesting to come off for a break for a few minutes ~ will trial on HHFNC -She confirms DNR/DNI status ~ Palliative Care to see today  -Echocardiogram is pending, staff just arrived at bedside to obtain  Objective   Blood pressure 113/60, pulse 93,  temperature 97.8 F (36.6 C), temperature source Oral, resp. rate (!) 24, height 4\' 11"  (1.499 m), weight 79.4 kg, SpO2 99%.    FiO2 (%):  [80 %-100 %] 100 %   Intake/Output Summary (Last 24 hours) at 03/25/2023 0850 Last data filed at 03/24/2023 2334 Gross per 24 hour  Intake 350 ml  Output --  Net 350 ml   Filed Weights   03/23/23 1357  Weight: 79.4 kg    Examination: General: Acute on chronically ill-appearing, obese elderly female, laying in bed, on BiPAP with mild respiratory distress HENT: Atraumatic, normocephalic, neck supple, difficult to assess JVD due to body habitus and BiPAP Lungs: Coarse crackles throughout with faint expiratory wheezing, mild tachypnea and accessory muscle use, BiPAP assisted, even Cardiovascular: Tachycardia, regular rhythm, S1-S2, no murmurs, rubs, gallops Abdomen: Obese, soft, nontender, nondistended, no guarding or rebound tenderness, bowel sounds positive x 4 Extremities: Generalized weakness, normal bulk and tone, left lower extremity cellulitis and wound Neuro: Awake and alert, oriented x 3, moves all extremities to command, no focal deficits, speech clear, pupils PERRLA GU: Deferred   Resolved Hospital Problem list     Assessment & Plan:   #Acute on Chronic Hypoxic Respiratory Failure in the setting of setting of Influenza A infection, Streptococcus Pneumoniae pneumonia, pulmonary edema, and Acute COPD Exacerbation  PMHx: ILD, COPD requiring 2-3L supplemental O2, OSA on CPAP -Supplemental O2 as needed to maintain O2 sats 88 to 92% -BiPAP, wean as tolerated (pt is DNR/DNI) -Follow intermittent Chest X-ray & ABG as needed -Bronchodilators & Pulmicort nebs -IV Steroids -ABX as above -Diuresis as BP and renal function permits ~ received 80 mg IV Lasix x2 just before MN on 2/19 -Pulmonary toilet as able  #Severe Sepsis #Influenza A infection #Streptococcus Pneumoniae Pneumonia #Left  Lower Extremity Cellulitis -Monitor fever  curve -Trend WBC's  -Follow cultures as above -Continue empiric Azithromycin, Ceftriaxone, and Vancomycin pending cultures & sensitivities -Wound care consulted, appreciate input  #Acute Decompensated HFpEF #Tachycardia, suspect compensatory due to Acute Respiratory Failure and Sepsis #Atrial Fibrillation PMHx: HTN, HLD Echocardiogram 03/30/22: LVEF 55 to 60%, grade 2 diastolic dysfunction, RV systolic function normal, RV size normal, mild to moderate mitral valve regurgitation, mild to moderate aortic valve regurgitation, mild to moderate aortic valve stenosis -Continuous cardiac monitoring -Maintain MAP >65 -Hold IV fluids for now -Vasopressors as needed to maintain MAP goal ~ not requiring -Diuresis as BP and renal function permits ~ received 80 mg IV Lasix x dose just before MN on 2/19 -Continue Eliquis and Nebivolol -Repeat echocardiogram pending  #AKI on CKD Stage IIIb #Hypokalemia ~ RESOLVED -Monitor I&O's / urinary output -Follow BMP -Ensure adequate renal perfusion -Avoid nephrotoxic agents as able -Replace electrolytes as indicated ~ Pharmacy following for assistance with electrolyte replacement  #Acute Metabolic Encephalopathy  PMHx: Bipolar disorder -Treatment of metabolic derangements as outlined above -Provide supportive care -Promote normal sleep/wake cycle and family presence -Avoid sedating medications as able -Resume home Lamictal, Lexapro, BuSpar once respiratory status improves and able to tolerate p.o. intake     Patient is critically ill with sepsis and acute hypoxic respiratory failure requiring BiPAP.  High risk for further respiratory decompensation, cardiac arrest and death.  Patient is already DNR/DNI.  Given her current critical illness superimposed on multiple chronic comorbidities and advanced age, if clinical course does not improve would consider transition to comfort measures.  Consult palliative care to assist with goals of care.   Best  Practice (right click and "Reselect all SmartList Selections" daily)   Diet/type: NPO until respiratory status improved DVT prophylaxis: DOAC GI prophylaxis: N/A Lines: N/A Foley:  N/A Code Status:  DNR Last date of multidisciplinary goals of care discussion [N/A]  2/20: Pt updated at bedside on plan of care.  Labs   CBC: Recent Labs  Lab 03/23/23 1359 03/24/23 0257 03/25/23 0228  WBC 21.1* 17.3* 21.0*  NEUTROABS 15.7*  --   --   HGB 9.5* 8.1* 9.3*  HCT 30.3* 25.9* 29.8*  MCV 85.4 84.6 84.7  PLT 254 247 322    Basic Metabolic Panel: Recent Labs  Lab 03/23/23 1359 03/24/23 0257 03/25/23 0228  NA 136 135 139  K 2.8* 3.4* 4.3  CL 96* 102 103  CO2 25 23 20*  GLUCOSE 158* 158* 241*  BUN 27* 24* 31*  CREATININE 1.74* 1.35* 1.62*  CALCIUM 8.2* 7.6* 7.3*  MG  --  1.7 1.7  PHOS  --  2.4* 3.8   GFR: Estimated Creatinine Clearance: 25.2 mL/min (A) (by C-G formula based on SCr of 1.62 mg/dL (H)). Recent Labs  Lab 03/23/23 1359 03/23/23 1854 03/24/23 0257 03/25/23 0228  PROCALCITON 0.76  --   --  3.18  WBC 21.1*  --  17.3* 21.0*  LATICACIDVEN 2.1* 2.1*  --  3.0*    Liver Function Tests: Recent Labs  Lab 03/23/23 1359 03/24/23 0257 03/25/23 0228  AST 25  --   --   ALT 18  --   --   ALKPHOS 73  --   --   BILITOT 1.0  --   --   PROT 7.2  --   --   ALBUMIN 2.8* 2.3* 2.2*   No results for input(s): "LIPASE", "AMYLASE" in the last 168 hours. No results for input(s): "AMMONIA" in the last  168 hours.  ABG    Component Value Date/Time   PHART 7.35 03/24/2023 0557   PCO2ART 43 03/24/2023 0557   PO2ART 139 (H) 03/24/2023 0557   HCO3 23.7 03/24/2023 0557   ACIDBASEDEF 2.0 03/24/2023 0557   O2SAT 97.6 03/24/2023 0557     Coagulation Profile: Recent Labs  Lab 03/23/23 1359  INR 1.6*    Cardiac Enzymes: No results for input(s): "CKTOTAL", "CKMB", "CKMBINDEX", "TROPONINI" in the last 168 hours.  HbA1C: Hgb A1c MFr Bld  Date/Time Value Ref Range  Status  07/17/2022 05:20 AM 6.2 (H) 4.8 - 5.6 % Final    Comment:    (NOTE) Pre diabetes:          5.7%-6.4%  Diabetes:              >6.4%  Glycemic control for   <7.0% adults with diabetes   03/31/2022 03:08 AM 6.1 (H) 4.8 - 5.6 % Final    Comment:    (NOTE)         Prediabetes: 5.7 - 6.4         Diabetes: >6.4         Glycemic control for adults with diabetes: <7.0     CBG: No results for input(s): "GLUCAP" in the last 168 hours.  Review of Systems:   Positives in BOLD: Gen: Denies fever, chills, weight change, fatigue, night sweats HEENT: Denies blurred vision, double vision, hearing loss, tinnitus, sinus congestion, rhinorrhea, sore throat, neck stiffness, dysphagia PULM: Denies shortness of breath, cough, sputum production, hemoptysis, wheezing CV: Denies chest pain, edema, orthopnea, paroxysmal nocturnal dyspnea, palpitations GI: Denies abdominal pain, nausea, vomiting, diarrhea, hematochezia, melena, constipation, change in bowel habits GU: Denies dysuria, hematuria, polyuria, oliguria, urethral discharge Endocrine: Denies hot or cold intolerance, polyuria, polyphagia or appetite change Derm: Denies rash, dry skin, scaling or peeling skin change Heme: Denies easy bruising, bleeding, bleeding gums Neuro: Denies headache, numbness, weakness, slurred speech, loss of memory or consciousness   Past Medical History:  She,  has a past medical history of Acromial process of scapula fracture (12/21/2019), Acute kidney injury superimposed on chronic kidney disease (HCC) (05/26/2021), Acute on chronic combined systolic (congestive) and diastolic (congestive) heart failure (HCC) (07/28/2021), Acute on chronic combined systolic and diastolic CHF (congestive heart failure) (HCC) (05/26/2021), Acute on chronic respiratory failure with hypoxia (HCC) (09/10/2021), Acute pain of left knee (12/18/2020), Anemia in stage 3a chronic kidney disease (HCC) (10/09/2021), Anxiety, Anxiety  (01/20/2019), Anxiety and depression (05/26/2021), Aortic stenosis (07/09/2015), Arrhythmia, Assistance needed for mobility (12/18/2020), Asthma, At high risk for falls (03/16/2019), Avascular necrosis of left shoulder due to adverse effect of steroid therapy (HCC) (01/20/2018), Bipolar disorder (HCC), C. difficile diarrhea (2010), Carotid atherosclerosis, bilateral, Cataract, CHF (congestive heart failure) (HCC), Chicken pox, Chronic left shoulder pain (08/29/2019), Chronic respiratory failure with hypoxia (HCC) (10/01/2021), CKD (chronic kidney disease), stage III (HCC), Closed fracture of acromial process of scapula (10/28/2018), Closed nondisplaced fracture of proximal phalanx of lesser toe of right foot, Collapsed vertebra, not elsewhere classified, lumbar region, initial encounter for fracture (HCC) (02/21/2019), Community acquired pneumonia (07/22/2021), Complicated grief (03/16/2019), Compression fracture of L4 vertebra (HCC) (02/21/2019), Conversion disorder, Conversion disorder (08/04/2015), COPD (chronic obstructive pulmonary disease) (HCC), COPD with exacerbation (HCC) (08/20/2021), Cough (05/02/2012), Depression, Dislocation of hip joint prosthesis (HCC) (01/08/2017), Elevated brain natriuretic peptide (BNP) level (12/18/2020), Elevated d-dimer (05/26/2021), Episodic mood disorder (HCC) (04/18/2019), Essential hypertension, Fatigue (07/07/2018), Frequent falls (12/18/2020), GERD (gastroesophageal reflux disease), H/O cardiac catheterization (02/24/2011), Hav (hallux abducto  valgus), unspecified laterality (12/05/2018), Heart murmur, Hip fracture (HCC) (10/31/2016), Hip pain (12/18/2020), History of fracture of left hip (01/15/2017), History of syncope (08/25/2016), Hyperkalemia (05/26/2021), Hyperlipidemia, Hypokalemia (08/25/2016), ILD (interstitial lung disease) (HCC), Iliopsoas bursitis of right hip (08/29/2019), Inflammatory arthritis, Insomnia (07/17/2015), Intractable nausea and vomiting  (08/10/2022), Left foot pain (10/19/2016), Left lower lobe pneumonia (05/26/2021), Leg edema (05/02/2019), Leg pain, bilateral, Leg swelling (12/15/2016), Leukocytosis (10/19/2017), Leukopenia (06/24/2021), Lymphocytosis (07/13/2021), Macular degeneration, Microcytic anemia (08/15/2015), Mixed bipolar I disorder (HCC) (10/09/2015), Nocturnal hypoxemia, Nocturnal hypoxemia (09/09/2011), Non-Obstructive CAD, NSIP (nonspecific interstitial pneumonitis) (HCC) (12/08/2019), Osteoarthritis, Other shoulder lesions, left shoulder (12/20/2017), Other specified postprocedural states (07/20/2013), Overweight (BMI 25.0-29.9) (08/29/2019), PAD (peripheral artery disease) (HCC), Pain due to onychomycosis of toenails of both feet (12/05/2018), Pelvic mass in female (03/16/2019), Physical deconditioning (02/27/2021), Pleural effusion on left (12/18/2020), PUD (peptic ulcer disease), Recurrent falls (09/01/2021), Recurrent pneumonia (07/23/2021), Repeated falls (12/18/2020), Respiratory failure with hypoxia (HCC) (01/12/2021), Rotator cuff tendinitis, right (07/26/2017), S/P lumbar fusion (05/23/2021), S/P right hip fracture, Sepsis due to pneumonia (HCC) (07/22/2021), Shoulder pain, Sleep apnea, SOB (shortness of breath) (10/13/2011), Spinal stenosis at L4-L5 level, Spinal stenosis of lumbar region (09/15/2016), Stage 3a chronic kidney disease (CKD) (HCC) (07/23/2021), Status post hip hemiarthroplasty (01/08/2017), Status post lumbar spinal fusion (01/15/2017), Status post reverse arthroplasty of shoulder, left (07/26/2018), Status post reverse total shoulder replacement, right (11/04/2017), Strain of right hip (02/26/2017), Thrombocytosis (06/24/2021), Toe fracture, right (09/09/2021), Toxic maculopathy, 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   Surgical History:   Past Surgical History:  Procedure Laterality Date    APPENDECTOMY     APPLICATION OF INTRAOPERATIVE CT SCAN N/A 05/23/2021   Procedure: APPLICATION OF INTRAOPERATIVE CT SCAN;  Surgeon: Venetia Night, MD;  Location: [facility] ORS;  Service: Neurosurgery;  Laterality: N/A;   BACK SURGERY     lumbar fusion   BUNIONECTOMY Right    CATARACT EXTRACTION, BILATERAL     CESAREAN SECTION     x1   CHOLECYSTECTOMY N/A 05/11/2016   Procedure: LAPAROSCOPIC CHOLECYSTECTOMY;  Surgeon: Lattie Haw, MD;  Location: [facility] OR[facility];  Service: General;  Laterality: N/A;   COLONOSCOPY WITH PROPOFOL N/A 04/02/2016   Procedure: COLONOSCOPY WITH PROPOFOL;  Surgeon: Wyline Mood, MD;  Location: [facility];  Service: Endoscopy;  Laterality: N/A;   ENDOSCOPIC RETROGRADE CHOLANGIOPANCREATOGRAPHY (ERCP) WITH PROPOFOL N/A 05/08/2016   Procedure: ENDOSCOPIC RETROGRADE CHOLANGIOPANCREATOGRAPHY (ERCP) WITH PROPOFOL;  Surgeon: Midge Minium, MD;  Location: [facility] ENDOSCOPY;  Service: Endoscopy;  Laterality: N/A;   [facility]     with biliary spincterotomy 05/08/16 Dr. Servando Snare for choledocholithiasis    ESOPHAGEAL DILATION  04/02/2016   Procedure: ESOPHAGEAL DILATION;  Surgeon: Wyline Mood, MD;  Location: [facility];  Service: Endoscopy;;   ESOPHAGOGASTRODUODENOSCOPY (EGD) WITH PROPOFOL N/A 04/02/2016   Procedure: ESOPHAGOGASTRODUODENOSCOPY (EGD) WITH PROPOFOL;  Surgeon: Wyline Mood, MD;  Location: [facility];  Service: Endoscopy;  Laterality: N/A;   HIP ARTHROPLASTY Right 11/01/2016   Procedure: ARTHROPLASTY BIPOLAR HIP (HEMIARTHROPLASTY);  Surgeon: Christena Flake, MD;  Location: [facility];  Service: Orthopedics;  Laterality: Right;   LUMBAR LAMINECTOMY/DECOMPRESSION MICRODISCECTOMY N/A 12/11/2020   Procedure: LEFT L2-3 MICRODISCECTOMY, L3-4 AND L5-S1 DECOMPRESSION;  Surgeon: Venetia Night, MD;  Location: [facility];  Service: Neurosurgery;  Laterality: N/A;   NEPHRECTOMY  1988   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   ORIF SCAPHOID FRACTURE Left 12/21/2019    Procedure: OPEN REDUCTION INTERNAL FIXATION (ORIF) OF  LEFT SCAPULAR NONUNION WITH BONE GRAFT;  Surgeon: Christena Flake, MD;  Location: [facility];  Service: Orthopedics;  Laterality: Left;   osteoporosis     noted DEXA 08/19/16    REPLACEMENT TOTAL KNEE Right    REVERSE SHOULDER ARTHROPLASTY Right 11/04/2017   Procedure: REVERSE SHOULDER ARTHROPLASTY;  Surgeon: Christena Flake, MD;  Location: [facility];  Service: Orthopedics;  Laterality: Right;   REVERSE SHOULDER ARTHROPLASTY Left 07/26/2018   Procedure: REVERSE SHOULDER ARTHROPLASTY;  Surgeon: Christena Flake, MD;  Location: [facility];  Service: Orthopedics;  Laterality: Left;   TONSILLECTOMY     TOTAL HIP ARTHROPLASTY  12/10/11   [facility] left hip   TOTAL HIP ARTHROPLASTY Bilateral    TRANSFORAMINAL LUMBAR INTERBODY FUSION (TLIF) WITH PEDICLE SCREW FIXATION 3 LEVEL N/A 05/23/2021   Procedure: OPEN L2-4 TRANSFORAMINAL LUMBAR INTERBODY FUSION (TLIF) WITH L2-5 POSTERIOR SPINAL FUSION;  Surgeon: Venetia Night, MD;  Location: [facility];  Service: Neurosurgery;  Laterality: N/A;   TUBAL LIGATION       Social History:   reports that she quit smoking about 49 years ago. Her smoking use included cigarettes. She started smoking about 69 years ago. She has a 10 pack-year smoking history. She has never used smokeless tobacco. She reports that she does not drink alcohol and does not use drugs.   Family History:  Her family history includes Alcohol abuse in her brother; Asthma in her mother and sister; Breast cancer in her sister; Cancer in her maternal grandmother; Colon cancer in her maternal grandmother; Dementia in her son; Depression in her brother; Diabetes in her maternal grandmother and son; Gout in her son; Heart attack in her father and sister; Heart disease in her brother, brother, father, maternal grandmother, mother, sister, sister, and sister; Hypertension in her father and mother; Lung cancer in her sister; Parkinson's disease in her mother; Peripheral  Artery Disease in her father; Rheum arthritis in her mother; Stroke in her mother.   Allergies Allergies  Allergen Reactions   Ceftin [Cefuroxime Axetil] Anaphylaxis    Has toleratd unasyn, ceftriaxone, zosyn   Lisinopril Anaphylaxis   Sulfa Antibiotics Other (See Comments)    Face swelling   Sulfasalazine Anaphylaxis   Morphine Other (See Comments)    Per patient, low blood pressure issues that requires action to raise it back up. Can take small infrequent doses   Xarelto [Rivaroxaban] Other (See Comments)    Stomach burning, bleeding, and tar in stool   Adhesive [Tape] Rash    Paper tape and tega derm OK   Antihistamines, Chlorpheniramine-Type Other (See Comments)    Makes pt hyper   Antivert [Meclizine Hcl] Other (See Comments)    Bladder will not empty   Aspirin Other (See Comments)    Sulfasalazine allergy cross reacts   Contrast Media [Iodinated Contrast Media] Rash    she is able to use betadine scrubs.   Decongestant [Pseudoephedrine Hcl] Other (See Comments)    Makes pt hyper   Doxycycline Other (See Comments)    GI upset   Levaquin [Levofloxacin In D5w] Rash   Polymyxin B Other (See Comments)    Facial rash   Tetanus Toxoids Rash and Other (See Comments)    Fever and hot to touch at injection site     Home Medications  Prior to Admission medications   Medication Sig Start Date End Date Taking? Authorizing Provider  albuterol (PROVENTIL) (2.5 MG/3ML) 0.083% nebulizer solution Take 3 mLs (2.5 mg total) by nebulization every 6 (six)  hours as needed for wheezing or shortness of breath. 11/17/22  Yes Salena Saner, MD  albuterol (VENTOLIN HFA) 108 (90 Base) MCG/ACT inhaler Inhale 2 puffs into the lungs every 6 (six) hours as needed for wheezing or shortness of breath. 06/24/21  Yes McLean-Scocuzza, Pasty Spillers, MD  amLODipine (NORVASC) 5 MG tablet Take 1 tablet (5 mg total) by mouth daily. Patient taking differently: Take 5 mg by mouth in the morning and at bedtime.  08/01/21  Yes McLean-Scocuzza, Pasty Spillers, MD  apixaban (ELIQUIS) 5 MG TABS tablet Take 5 mg by mouth 2 (two) times daily. 12/10/22  Yes [provider]  busPIRone (BUSPAR) 5 MG tablet Take 1 tablet (5 mg total) by mouth 2 (two) times daily. 06/02/21  Yes Lurene Shadow, MD  cetirizine (ZYRTEC) 10 MG tablet TAKE 1 TABLET BY MOUTH EVERYDAY AT BEDTIME 06/08/22  Yes Salena Saner, MD  dicyclomine (BENTYL) 10 MG capsule TAKE 1 CAPSULE (10 MG TOTAL) BY MOUTH 2 (TWO) TIMES DAILY AS NEEDED FOR SPASMS. 10/02/22  Yes Wyline Mood, MD  ENBREL SURECLICK 50 MG/ML injection Inject 50 mg into the skin once a week. Takes on Fridays   Yes [provider]  escitalopram (LEXAPRO) 10 MG tablet Take 1 tablet (10 mg total) by mouth daily. 08/01/21  Yes McLean-Scocuzza, Pasty Spillers, MD  famotidine (PEPCID) 20 MG tablet TAKE 1 TABLET BY MOUTH EVERYDAY AT BEDTIME 10/08/22  Yes Wyline Mood, MD  Fluticasone-Umeclidin-Vilant (TRELEGY ELLIPTA) 100-62.5-25 MCG/ACT AEPB Inhale 1 puff into the lungs daily. 12/01/22  Yes Salena Saner, MD  gabapentin (NEURONTIN) 300 MG capsule Take 300 mg in the morning and 600 mg at night. 01/11/23  Yes Dana Allan, MD  GEMTESA 75 MG TABS TAKE 75 MG BY MOUTH DAILY. 03/08/23  Yes Vaillancourt, Samantha, PA-C  lamoTRIgine (LAMICTAL) 100 MG tablet TAKE 1 TAB 2 TIMES DAILY. FURTHER REFILLS NEW PSYCH FOR ALL PSYCH MEDS ONLY TEMP SUPPLY FROM PCP 05/02/21  Yes McLean-Scocuzza, Pasty Spillers, MD  leflunomide (ARAVA) 20 MG tablet Take 1 tablet (20 mg total) by mouth daily. 09/30/17  Yes McLean-Scocuzza, Pasty Spillers, MD  lidocaine (LIDODERM) 5 % Place 1 patch onto the skin 2 (two) times daily as needed. Remove & Discard patch within 12 hours or as directed by MD 11/22/20  Yes McLean-Scocuzza, Pasty Spillers, MD  lovastatin (MEVACOR) 20 MG tablet Take 1 tablet (20 mg total) by mouth daily at 6 PM. 08/04/22  Yes Dana Allan, MD  methocarbamol (ROBAXIN) 500 MG tablet Take 500 mg by mouth 3 (three) times daily.   Yes  [provider]  mometasone (NASONEX) 50 MCG/ACT nasal spray USE 1 SPRAY TO EACH NOSTRIL TWICE A DAY 10/08/22  Yes Salena Saner, MD  montelukast (SINGULAIR) 10 MG tablet TAKE 1 TABLET BY MOUTH EVERY DAY 11/12/22  Yes Salena Saner, MD  multivitamin-lutein [facility]) CAPS capsule Take 1 capsule by mouth at bedtime.   Yes [provider]  nebivolol (BYSTOLIC) 5 MG tablet Take 1 tablet (5 mg total) by mouth in the morning and at bedtime. (Note dose changed from 1/2 10 mg bid to 5 mg bid) 08/01/21  Yes McLean-Scocuzza, Pasty Spillers, MD  OHTUVAYRE 3 MG/2.5ML SUSP Inhale 1 ampule into the lungs in the morning and at bedtime. 12/25/22  Yes [provider]  pantoprazole (PROTONIX) 40 MG tablet TAKE 1 TABLET BY MOUTH 2 TIMES DAILY 30 MIN BEFORE FOOD (NOTE REDUCTION IN FREQUENCY) 02/15/23  Yes Dana Allan, MD  predniSONE (DELTASONE) 10  MG tablet Take 10 mg by mouth daily with breakfast.   Yes [provider]  QUEtiapine (SEROQUEL) 50 MG tablet Take 50 mg by mouth at bedtime. 02/10/23  Yes [provider]  sucralfate (CARAFATE) 1 g tablet TAKE 1 TABLET BY MOUTH TWICE A DAY 11/02/22  Yes Wyline Mood, MD  torsemide (DEMADEX) 20 MG tablet Take 2 tablets (40 mg total) by mouth daily. 07/19/22 03/24/23 Yes Sreenath, Sudheer B, MD  OXYGEN Inhale 3 L into the lungs continuous.    [provider]  PROLIA 60 MG/ML SOSY injection Inject 60 mg into the skin every 6 (six) months. 03/09/22   [provider]     Critical care time: 40 minutes     Harlon Ditty, AGACNP-BC [facility] Prefer epic messenger for cross cover needs If after hours, please call E-link

## 2023-03-25 NOTE — Progress Notes (Signed)
PT Cancellation Note  Patient Details Name: REMEE CHARLEY MRN: [id number] DOB: 11-04-1940   Cancelled Treatment:    Reason Eval/Treat Not Completed: Medical issues which prohibited therapy Patient recently switched to [facility] Continues to have increased WOB. Palliative present on arrival and suggests to hold on PT/OT at this time and until medically stable. PT will complete orders at this time. Please re-consult once medically stable and patient able to participate.   Maylon Peppers, PT, DPT Physical Therapist - [facility]  [facility]    Ludmila Ebarb A Hiliana Eilts 03/25/2023, 11:09 AM

## 2023-03-25 NOTE — Progress Notes (Signed)
PROGRESS NOTE    Kerri Carter  OAC:[id number] DOB: 1940-09-07 DOA: 03/23/2023 PCP: Dana Allan, MD    Brief Narrative:   83 y.o. female with medical history significant for ILD and COPD on 2-3 L oxygen, hypertension, hyperlipidemia, PVD, CAD, dCHF, depression with anxiety, bipolar, CKD 3B, A-fib on Eliquis, OSA on CPAP, obesity, who presented via EMS for evaluation of generalized weakness, altered mental status and worsening wound.  Per son, patient was doing well until Sunday she started having significant weakness and some disorientation. On Monday, there was slight improvement in her mental status. Wound care came to check patient's left ankle wound and noticed purulent drainage. Today, patient was found to have a temperature of 102 with worsening weakness so they called EMS.  On their arrival, patient had a temperature of 103. Patient reports worsening cough that have become productive over the last few days. She also endorsed left flank pain, fatigue, chills and poor appetite but denies any nausea, vomiting, abdominal pain, chest pain, dizziness, palpitations, dysuria, or diarrhea. Son reports that multiple family members have the flu at home.    Assessment & Plan:   Principal Problem:   Severe sepsis (HCC) Active Problems:   Cellulitis of left lower leg   Hypokalemia   Influenza A   Multifocal pneumonia  # Severe sepsis Elderly patient presented with generalized weakness, chills, cough and worsening left foot infection after a recent hospitalization found to have multifocal pneumonia in the setting of influenza A infection. Patient met severe sepsis criteria with tachycardia, fever, leukocytosis, and elevated lactic acid in the setting of her respiratory and skin infections resulting in acute kidney injury. She is hemodynamically stable on IV fluids and IV antibiotics. Plan: Continue vancomycin Continue unasyn Follow blood and urine cultures Monitor vitals and fever curve   #  Multifocal pneumonia secondary to S. pneumo # Influenza A infection Patient with a history of COPD with a chronic cough who was exposed to influenza infection last week found to have a productive cough, fever and leukocytosis. RVP positive for influenza A PCR. CXR shows evidence of multifocal pneumonia. She stable from respiratory standpoint on only 2 L [place].  Patient with recent hospital contact so we will continue current antibiotics. Tamiflu unlikely to be effective at this point of her infection. Plan: IV antibiotics as above Continue azithromycin Likely no strong role for Tamiflu at this time  # Cellulitis of the left lower extremity Patient recently discharged after a brief admission for cellulitis of her left lower extremity now presenting with worsening left leg wound and some purulent drainage noticed by family at home. Patient met sepsis criteria on admission but remains at baseline mental status. Plan: Antibiotic as above -Wound care consulted, appreciate recs   # COPD # Acute on Chronic hypoxic respiratory failure Patient on baseline 2 to 3 L [place] at home. We are unable to wean from bipap.   Plan: Continue bipap, wean if possible Scheduled and prn bronchodilators   # AKI on CKD 3B Baseline creatinine seems to be around 1.1-1.2. Creatinine bumped to 1.74 on admission in the setting of dehydration from recent poor p.o. intake and infection. -No IVF -Hold further lasix   # HFpEF Last TTE in February 2024 showed EF 55 to 65%, G2 DD. Patient slightly dry on exam. -Hold home torsemide today in the setting of AKI -Continue supplemental O2   # Hypokalemia  -Monitor and replace as needed   # HTN BP stable in the 100s to  130s.  -Continue amlodipine and nebivolol   # A-fib Patient tachycardic with HR in the 100s to 110s. Reports she has not taken any of her meds today. -Continue Eliquis and nebivolol   # Rheumatoid arthritis -Continue home leflunomide   # HLD -Continue  pravastatin (substitute for lovastatin)   # Bipolar disorder -Continue Lamictal, Lexapro and BuSpar   # OSA -Continue CPAP at night   # Generalized weakness In the setting of recent infection and poor p.o. intake. -Start Ensure twice daily -PT/OT eval and treat   # Obesity Body mass index is 35.35 kg/m. -Weight loss and nutrition counseling with PCP in the outpatient   DVT prophylaxis: Eliquis Code Status: DNR Family Communication: Son August Saucer via phone 2/20 Disposition Plan: Status is: Inpatient Remains inpatient appropriate because: Multiple acute issues as above   Level of care: Stepdown  Consultants:  None  Procedures:  None  Antimicrobials: Vancomycin Unasyn    Subjective: Seen and examined.  Mentating clearly.  Increased WOB.  Objective: Vitals:   03/25/23 1425 03/25/23 1430 03/25/23 1500 03/25/23 1533  BP: 124/62 124/62 (!) 112/59   Pulse: 97 96 96 92  Resp: 20 (!) 21 (!) 21 15  Temp: 98.9 F (37.2 C)     TempSrc: Axillary     SpO2: 98% 98% 95% 97%  Weight: 81 kg     Height: 4\' 11"  (1.499 m)       Intake/Output Summary (Last 24 hours) at 03/25/2023 1551 Last data filed at 03/25/2023 1500 Gross per 24 hour  Intake 350 ml  Output 0 ml  Net 350 ml   Filed Weights   03/23/23 1357 03/25/23 1425  Weight: 79.4 kg 81 kg    Examination:  General exam: Appears fatigued Respiratory system: Coarse breath sounds bilaterally.  Normal work of breathing.  BiPAP 100% Cardiovascular system: S1-S2, RRR, no murmurs, no pedal edema Gastrointestinal system: Obese, soft, NT/ND, normal bowel sound Central nervous system: Alert and oriented. No focal neurological deficits. Extremities: Decreased power Skin: No rashes, lesions or ulcers Psychiatry: Judgement and insight appear normal. Mood & affect appropriate.     Data Reviewed: I have personally reviewed following labs and imaging studies  CBC: Recent Labs  Lab 03/23/23 1359 03/24/23 0257  03/25/23 0228  WBC 21.1* 17.3* 21.0*  NEUTROABS 15.7*  --   --   HGB 9.5* 8.1* 9.3*  HCT 30.3* 25.9* 29.8*  MCV 85.4 84.6 84.7  PLT 254 247 322   Basic Metabolic Panel: Recent Labs  Lab 03/23/23 1359 03/24/23 0257 03/25/23 0228  NA 136 135 139  K 2.8* 3.4* 4.3  CL 96* 102 103  CO2 25 23 20*  GLUCOSE 158* 158* 241*  BUN 27* 24* 31*  CREATININE 1.74* 1.35* 1.62*  CALCIUM 8.2* 7.6* 7.3*  MG  --  1.7 1.7  PHOS  --  2.4* 3.8   GFR: Estimated Creatinine Clearance: 25.5 mL/min (A) (by C-G formula based on SCr of 1.62 mg/dL (H)). Liver Function Tests: Recent Labs  Lab 03/23/23 1359 03/24/23 0257 03/25/23 0228  AST 25  --   --   ALT 18  --   --   ALKPHOS 73  --   --   BILITOT 1.0  --   --   PROT 7.2  --   --   ALBUMIN 2.8* 2.3* 2.2*   No results for input(s): "LIPASE", "AMYLASE" in the last 168 hours. No results for input(s): "AMMONIA" in the last 168 hours. Coagulation Profile: Recent  Labs  Lab 03/23/23 1359  INR 1.6*   Cardiac Enzymes: No results for input(s): "CKTOTAL", "CKMB", "CKMBINDEX", "TROPONINI" in the last 168 hours. BNP (last 3 results) No results for input(s): "PROBNP" in the last 8760 hours. HbA1C: No results for input(s): "HGBA1C" in the last 72 hours. CBG: Recent Labs  Lab 03/25/23 1432  GLUCAP 269*   Lipid Profile: No results for input(s): "CHOL", "HDL", "LDLCALC", "TRIG", "CHOLHDL", "LDLDIRECT" in the last 72 hours. Thyroid Function Tests: No results for input(s): "TSH", "T4TOTAL", "FREET4", "T3FREE", "THYROIDAB" in the last 72 hours. Anemia Panel: No results for input(s): "VITAMINB12", "FOLATE", "FERRITIN", "TIBC", "IRON", "RETICCTPCT" in the last 72 hours. Sepsis Labs: Recent Labs  Lab 03/23/23 1359 03/23/23 1854 03/25/23 0228  PROCALCITON 0.76  --  3.18  LATICACIDVEN 2.1* 2.1* 3.0*    Recent Results (from the past 240 hours)  Culture, blood (Routine x 2)     Status: None (Preliminary result)   Collection Time: 03/23/23  1:59  PM   Specimen: BLOOD  Result Value Ref Range Status   Specimen Description BLOOD BLOOD RIGHT WRIST  Final   Special Requests   Final    BOTTLES DRAWN AEROBIC AND ANAEROBIC Blood Culture results may not be optimal due to an inadequate volume of blood received in culture bottles   Culture   Final    NO GROWTH 2 DAYS Performed at [name], [address] [place], [place] [address]    Report Status PENDING  Incomplete  Urine Culture     Status: Abnormal (Preliminary result)   Collection Time: 03/23/23  1:59 PM   Specimen: Urine, Random  Result Value Ref Range Status   Specimen Description   Final    URINE, RANDOM Performed at [facility], [address], [place], [place] [address]    Special Requests   Final    NONE Reflexed from [id number] Performed at [facility], [address] [place], [place] [address]    Culture (A)  Final    >=100,000 COLONIES/mL ENTEROCOCCUS FAECALIS 50,000 COLONIES/mL ENTEROCOCCUS FAECIUM 20,000 COLONIES/mL PSEUDOMONAS AERUGINOSA    Report Status PENDING  Incomplete  Culture, blood (Routine x 2)     Status: None (Preliminary result)   Collection Time: 03/23/23  4:12 PM   Specimen: BLOOD  Result Value Ref Range Status   Specimen Description BLOOD RIGHT ANTECUBITAL  Final   Special Requests   Final    BOTTLES DRAWN AEROBIC AND ANAEROBIC Blood Culture results may not be optimal due to an inadequate volume of blood received in culture bottles   Culture   Final    NO GROWTH 2 DAYS Performed at [facility], [address], [place], [place] [address]    Report Status PENDING  Incomplete  Resp panel by RT-PCR (RSV, Flu A&B, Covid) Anterior Nasal Swab     Status: Abnormal   Collection Time: 03/23/23  4:12 PM   Specimen: Anterior Nasal Swab  Result Value Ref Range Status   SARS Coronavirus 2 by RT PCR NEGATIVE NEGATIVE Final    Comment: (NOTE) SARS-CoV-2 target nucleic acids are NOT DETECTED.  The SARS-CoV-2 RNA  is generally detectable in upper respiratory specimens during the acute phase of infection. The lowest concentration of SARS-CoV-2 viral copies this assay can detect is 138 copies/mL. A negative result does not preclude SARS-Cov-2 infection and should not be used as the sole basis for treatment or other patient management decisions. A negative result may occur with  improper specimen  collection/handling, submission of specimen other than nasopharyngeal swab, presence of viral mutation(s) within the areas targeted by this assay, and inadequate number of viral copies(<138 copies/mL). A negative result must be combined with clinical observations, patient history, and epidemiological information. The expected result is Negative.  Fact Sheet for Patients:  BloggerCourse.com  Fact Sheet for Healthcare Providers:  SeriousBroker.it  This test is no t yet approved or cleared by the [place] FDA and  has been authorized for detection and/or diagnosis of SARS-CoV-2 by FDA under an Emergency Use Authorization (EUA). This EUA will remain  in effect (meaning this test can be used) for the duration of the COVID-19 declaration under Section 564(b)(1) of the Act, 21 U.S.C.section 360bbb-3(b)(1), unless the authorization is terminated  or revoked sooner.       Influenza A by PCR POSITIVE (A) NEGATIVE Final   Influenza B by PCR NEGATIVE NEGATIVE Final    Comment: (NOTE) The Xpert Xpress SARS-CoV-2/FLU/RSV plus assay is intended as an aid in the diagnosis of influenza from Nasopharyngeal swab specimens and should not be used as a sole basis for treatment. Nasal washings and aspirates are unacceptable for Xpert Xpress SARS-CoV-2/FLU/RSV testing.  Fact Sheet for Patients: BloggerCourse.com  Fact Sheet for Healthcare Providers: SeriousBroker.it  This test is not yet approved or cleared by the  [place] FDA and has been authorized for detection and/or diagnosis of SARS-CoV-2 by FDA under an Emergency Use Authorization (EUA). This EUA will remain in effect (meaning this test can be used) for the duration of the COVID-19 declaration under Section 564(b)(1) of the Act, 21 U.S.C. section 360bbb-3(b)(1), unless the authorization is terminated or revoked.     Resp Syncytial Virus by PCR NEGATIVE NEGATIVE Final    Comment: (NOTE) Fact Sheet for Patients: BloggerCourse.com  Fact Sheet for Healthcare Providers: SeriousBroker.it  This test is not yet approved or cleared by the [place] FDA and has been authorized for detection and/or diagnosis of SARS-CoV-2 by FDA under an Emergency Use Authorization (EUA). This EUA will remain in effect (meaning this test can be used) for the duration of the COVID-19 declaration under Section 564(b)(1) of the Act, 21 U.S.C. section 360bbb-3(b)(1), unless the authorization is terminated or revoked.  Performed at [facility], [address] [place], [place] [address]   Expectorated Sputum Assessment w Gram Stain, Rflx to Resp Cult     Status: None   Collection Time: 03/23/23  9:29 PM   Specimen: Sputum  Result Value Ref Range Status   Specimen Description SPUTUM  Final   Special Requests NONE  Final   Sputum evaluation   Final    THIS SPECIMEN IS ACCEPTABLE FOR SPUTUM CULTURE Performed at [facility], [address], [place], [place] [address]    Report Status 03/23/2023 FINAL  Final  Culture, Respiratory w Gram Stain     Status: None (Preliminary result)   Collection Time: 03/23/23  9:29 PM   Specimen: SPU  Result Value Ref Range Status   Specimen Description   Final    SPUTUM Performed at [facility], [address], [place], [place] [address]    Special Requests   Final    NONE Reflexed from [id number] Performed at [facility], [address] [place], [place] [address]    Gram Stain   Final    FEW WBC PRESENT,BOTH PMN AND MONONUCLEAR FEW GRAM POSITIVE COCCI IN PAIRS FEW YEAST    Culture   Final  CULTURE REINCUBATED FOR BETTER GROWTH Performed at [facility], [address] [place], [place] [address]    Report Status PENDING  Incomplete  MRSA Next Gen by PCR, Nasal     Status: None   Collection Time: 03/25/23 10:01 AM   Specimen: Nasal Mucosa; Nasal Swab  Result Value Ref Range Status   MRSA by PCR Next Gen NOT DETECTED NOT DETECTED Final    Comment: (NOTE) The GeneXpert MRSA Assay (FDA approved for NASAL specimens only), is one component of a comprehensive MRSA colonization surveillance program. It is not intended to diagnose MRSA infection nor to guide or monitor treatment for MRSA infections. Test performance is not FDA approved in patients less than 23 years old. Performed at [facility], [address], [place], [place] [address]          Radiology Studies: DG Chest [place] 1 View Result Date: 03/24/2023 CLINICAL DATA:  Dyspnea EXAM: PORTABLE CHEST 1 VIEW COMPARISON:  03/24/2023 FINDINGS: Multifocal pulmonary consolidation in keeping with multifocal infection appears progressive within the right upper lobe since prior examination. Small bilateral pleural effusions again noted. No pneumothorax. Cardiac size within normal limits. Vascularity is normal. No acute bone abnormality. Healed right fourth rib fracture. Bilateral total shoulder arthroplasty at lumbar fusion hardware partially visualized. IMPRESSION: 1. Progressive multifocal infection. 2. Small bilateral pleural effusions. Electronically Signed   By: Helyn Numbers M.D.   On: 03/24/2023 23:11   DG Chest Port 1 View Result Date: 03/24/2023 CLINICAL DATA:  83 year old female with shortness of breath, hypoxia. EXAM: PORTABLE CHEST 1 VIEW COMPARISON:  Chest radiographs yesterday and earlier. FINDINGS: Portable AP upright view at  0642 hours. Progressive, now moderate to large area of abnormal airspace opacity in the left lower lung. Larger lung volumes compared to yesterday. Stable cardiac size and mediastinal contours. No pneumothorax. Chronic right upper rib deformities which appears to account for the stable asymmetric right upper lung opacity. Small left pleural effusion not excluded. No pneumothorax or pulmonary edema. Bilateral shoulder arthroplasty. Paucity of bowel gas in the visible abdomen. IMPRESSION: 1. Increasing left mid and lower lung airspace opacity since yesterday compatible with Worsening Pneumonia. Possible small pleural effusion(s). 2. Chronic right upper rib deformities felt to account for asymmetric right upper lung opacity. Electronically Signed   By: Odessa Fleming M.D.   On: 03/24/2023 06:56   DG Ankle Complete Left Result Date: 03/23/2023 CLINICAL DATA:  Wound infection. Purulence drainage from anterior ankle. EXAM: LEFT ANKLE COMPLETE - 3+ VIEW COMPARISON:  Tibia/fibula radiograph 02/24/2023 FINDINGS: There is no evidence of fracture, dislocation, or joint effusion. Ankle mortise is preserved. No erosion or periostitis. Anterior soft tissue edema. No radiopaque foreign body or soft tissue gas. IMPRESSION: Anterior soft tissue edema. No radiographic evidence of osteomyelitis. Electronically Signed   By: Narda Rutherford M.D.   On: 03/23/2023 18:27        Scheduled Meds:  amLODipine  5 mg Oral BID   apixaban  2.5 mg Oral BID   budesonide (PULMICORT) nebulizer solution  0.5 mg Nebulization BID   busPIRone  5 mg Oral BID   escitalopram  10 mg Oral Daily   feeding supplement  237 mL Oral BID   Gerhardt's butt cream   Topical TID   ipratropium-albuterol  3 mL Nebulization Q4H   lamoTRIgine  100 mg Oral BID   leflunomide  20 mg Oral Daily   methylPREDNISolone (SOLU-MEDROL) injection  40 mg Intravenous Daily   montelukast  10 mg  Oral QHS   multivitamin  1 tablet Oral Daily   nebivolol  5 mg Oral BID    pravastatin  20 mg Oral q1800   Continuous Infusions:  azithromycin Stopped (03/24/23 2334)   cefTRIAXone (ROCEPHIN)  IV Stopped (03/24/23 2006)   [START ON 03/27/2023] vancomycin       LOS: 2 days    Tresa Moore, MD [facility]   If 7PM-7AM, please contact night-coverage  03/25/2023, 3:51 PM

## 2023-03-25 NOTE — Progress Notes (Signed)
Pt placed back on BiPAP per NP for increased work of breathing

## 2023-03-25 NOTE — Progress Notes (Signed)
OT Cancellation Note  Patient Details Name: Kerri Carter MRN: [id number] DOB: 1940/08/24   Cancelled Treatment:    Reason Eval/Treat Not Completed: Medical issues which prohibited therapy. Pt recently off BiPap and now on HHFNC with increased work of breathing. Palliative present on arrival and suggests to hold PT/OT at this time until medically stable. Please re-consult when stable, and pt able to participate. OT to complete orders at this time.  Tinisha Etzkorn L. Pike Scantlebury, OTR/L  03/25/23, 11:15 AM

## 2023-03-25 NOTE — Progress Notes (Incomplete)
Pharmacy Antibiotic Note  Kerri Carter is a 83 y.o. female admitted on 03/23/2023 with wound infection/cellulitis & pneumonia.  Pharmacy has been consulted for Aztreonam & Vancomycin dosing.  Plan: Pt given Vancomycin 1000 mg once 2/18 @ 1823 and additional dose of 1000 mg once to complete load on 2/19@ Vancomycin 1000 mg IV Q 48 hrs. Goal AUC 400-550. Expected AUC: 460.4 SCr used: 1.62, Vd used: 0.5 BMI 35.3  Pharmacy will continue to follow and will adjust abx dosing whenever warranted.  Temp (24hrs), Avg:98.2 F (36.8 C), Min:97.8 F (36.6 C), Max:98.6 F (37 C)   Recent Labs  Lab 03/23/23 1359 03/23/23 1854 03/24/23 0257 03/25/23 0228  WBC 21.1*  --  17.3* 21.0*  CREATININE 1.74*  --  1.35* 1.62*  LATICACIDVEN 2.1* 2.1*  --  3.0*    Estimated Creatinine Clearance: 25.2 mL/min (A) (by C-G formula based on SCr of 1.62 mg/dL (H)).    Allergies  Allergen Reactions   Ceftin [Cefuroxime Axetil] Anaphylaxis    Has toleratd unasyn, ceftriaxone, zosyn   Lisinopril Anaphylaxis   Sulfa Antibiotics Other (See Comments)    Face swelling   Sulfasalazine Anaphylaxis   Morphine Other (See Comments)    Per patient, low blood pressure issues that requires action to raise it back up. Can take small infrequent doses   Xarelto [Rivaroxaban] Other (See Comments)    Stomach burning, bleeding, and tar in stool   Adhesive [Tape] Rash    Paper tape and tega derm OK   Antihistamines, Chlorpheniramine-Type Other (See Comments)    Makes pt hyper   Antivert [Meclizine Hcl] Other (See Comments)    Bladder will not empty   Aspirin Other (See Comments)    Sulfasalazine allergy cross reacts   Contrast Media [Iodinated Contrast Media] Rash    she is able to use betadine scrubs.   Decongestant [Pseudoephedrine Hcl] Other (See Comments)    Makes pt hyper   Doxycycline Other (See Comments)    GI upset   Levaquin [Levofloxacin In D5w] Rash   Polymyxin B Other (See Comments)    Facial rash    Tetanus Toxoids Rash and Other (See Comments)    Fever and hot to touch at injection site    Antimicrobials this admission: 2/18 Azithromycin >> x 5 days 2/18 Aztreonam x1 2/18 Vancomycin >> 2/19 Unasyn >>   Microbiology results: 2/18 BCx: NGTD 2/18 UCx: Pending   Thank you for allowing pharmacy to be a part of this patient's care.  Bettey Costa, PharmD Clinical Pharmacist 03/25/2023 11:43 AM

## 2023-03-25 NOTE — Progress Notes (Signed)
*  PRELIMINARY RESULTS* Echocardiogram 2D Echocardiogram has been performed.  Cristela Blue 03/25/2023, 10:43 AM

## 2023-03-25 NOTE — Progress Notes (Signed)
Pt removed from BiPAP and placed on HHFNC 60L 100%. Pt tolerated well. Will continue to monitor

## 2023-03-25 NOTE — Consult Note (Addendum)
 Consultation Note Date: 03/25/2023   Patient Name: Kerri Carter  DOB: Feb 22, 1940  MRN: [id number]  Age / Sex: 83 y.o., female  PCP: Dana Allan, MD Referring Physician: Tresa Moore, MD  Reason for Consultation: Establishing goals of care  HPI/Patient Profile: Kerri Carter is a 83 y.o. female with medical history significant for ILD and COPD on 2-3 L oxygen, hypertension, hyperlipidemia, PVD, CAD, dCHF, depression with anxiety, bipolar, CKD 3B, A-fib on Eliquis, OSA on CPAP, obesity, who presented via EMS for evaluation of generalized weakness, altered mental status and worsening wound. Wound care came to check patient's left ankle wound and noticed purulent drainage.  They noted that patient had a fever as well.    On their arrival, patient had a temperature of 103. Patient reports worsening cough that have become productive over the last few days. Son reports that multiple family members have the flu at home.    Clinical Assessment and Goals of Care: Notes and labs reviewed.  In to see patient.  She is known to the PMT team from a previous admission.  She is currently sitting in bed on high flow cannula with son at bedside, WOB noted.  Patient currently lives with her grandson and his wife.  She has a paid caregiver that comes in a few times a week to help her.  She uses a rollator and 3 L of oxygen at baseline.  We discussed her diagnoses, prognosis, GOC, EOL wishes disposition and options.  Created space and opportunity for patient  to explore thoughts and feelings regarding current medical information.   A detailed discussion was had today regarding advanced directives.  Concepts specific to code status, artifical feeding and hydration, IV antibiotics and rehospitalization were discussed.  The difference between an aggressive medical intervention path and a comfort care path was discussed.   Values and goals of care important to patient and family were attempted to be elicited.  Discussed limitations of medical interventions to prolong quality of life in some situations and discussed the concept of human mortality.  Son states he is a Education officer, environmental as was his father, the patient's husband.  He discusses his father suffering and death here at [facility], and discusses healing and heaven. He is aware of poor prognosis.    At this time patient wishes to continue trying to treat the treatable and confirms DNR/DNI status.  She is amenable to continue high flow cannula and BiPAP mask.  She is aware that she can transition to comfort care at any time should she wish to do so.  PT in to work with patient and patient states she has no desire to work with therapy as she feels short of breath.  Patient's son and grandson are her H POA's.    SUMMARY OF RECOMMENDATIONS    DNR/DNI.  Continue high flow cannula and BiPAP as needed.   Prognosis:  Poor       Primary Diagnoses: Present on Admission:  Severe sepsis (HCC)  Hypokalemia  Cellulitis of left  lower leg   I have reviewed the medical record, interviewed the patient and family, and examined the patient. The following aspects are pertinent.  Past Medical History:  Diagnosis Date   Acromial process of scapula fracture 12/21/2019   Acute kidney injury superimposed on chronic kidney disease (HCC) 05/26/2021   Acute on chronic combined systolic (congestive) and diastolic (congestive) heart failure (HCC) 07/28/2021   Acute on chronic combined systolic and diastolic CHF (congestive heart failure) (HCC) 05/26/2021   Acute on chronic respiratory failure with hypoxia (HCC) 09/10/2021   Acute pain of left knee 12/18/2020   Anemia in stage 3a chronic kidney disease (HCC) 10/09/2021   Anxiety    Anxiety 01/20/2019   Anxiety and depression 05/26/2021   Aortic stenosis 07/09/2015   a.) TTE 07/06/2015: EF 55-60%; mild AS with MPG 13.0 mmHg.    Arrhythmia    atrial fibrillation   Assistance needed for mobility 12/18/2020   Asthma    At high risk for falls 03/16/2019   Avascular necrosis of left shoulder due to adverse effect of steroid therapy (HCC) 01/20/2018   Bipolar disorder (HCC)    C. difficile diarrhea 2010   a.) following ABX course during hospital admission   Carotid atherosclerosis, bilateral    a.) Moderate; < 50% stenosis BILATERAL ICAs.   Cataract    a.) s/p BILATERAL extraction   CHF (congestive heart failure) (HCC)    Chicken pox    Chronic left shoulder pain 08/29/2019   Chronic respiratory failure with hypoxia (HCC) 10/01/2021   CKD (chronic kidney disease), stage III (HCC)    a. s/p R nephrectomy./ aneurysm   Closed fracture of acromial process of scapula 10/28/2018   Closed nondisplaced fracture of proximal phalanx of lesser toe of right foot    Collapsed vertebra, not elsewhere classified, lumbar region, initial encounter for fracture (HCC) 02/21/2019   Community acquired pneumonia 07/22/2021   Complicated grief 03/16/2019   Compression fracture of L4 vertebra (HCC) 02/21/2019   Conversion disorder    Conversion disorder 08/04/2015   COPD (chronic obstructive pulmonary disease) (HCC)    COPD with exacerbation (HCC) 08/20/2021   Cough 05/02/2012   Depression    Dislocation of hip joint prosthesis (HCC) 01/08/2017   Elevated brain natriuretic peptide (BNP) level 12/18/2020   Elevated d-dimer 05/26/2021   Episodic mood disorder (HCC) 04/18/2019   Essential hypertension    Fatigue 07/07/2018   Frequent falls 12/18/2020   GERD (gastroesophageal reflux disease)    H/O cardiac catheterization 02/24/2011   Formatting of this note might be different from the original.  Repeated at [facility] 2/13 and insignificant disease   Hav (hallux abducto valgus), unspecified laterality 12/05/2018   Heart murmur    Hip fracture (HCC) 10/31/2016   Hip pain 12/18/2020   History of fracture of left hip 01/15/2017    History of syncope 08/25/2016   Hyperkalemia 05/26/2021   - The patient was given p.o. Lokelma and IV Lasix.  - We will follow potassium level.   Hyperlipidemia    Hypokalemia 08/25/2016   ILD (interstitial lung disease) (HCC)    mild; 2/2 RA diagnosis   Iliopsoas bursitis of right hip 08/29/2019   Inflammatory arthritis    a. hands/carpal tunnel.  b. Low titer rheumatoid factor. c. Negative anti-CCP antibodies. d. Plaquenil.   Insomnia 07/17/2015   Intractable nausea and vomiting 08/10/2022   Left foot pain 10/19/2016   Left lower lobe pneumonia 05/26/2021   Leg edema 05/02/2019   Leg pain,  bilateral    Leg swelling 12/15/2016   Leukocytosis 10/19/2017   Leukopenia 06/24/2021   Lymphocytosis 07/13/2021   Macular degeneration    Microcytic anemia 08/15/2015   Mixed bipolar I disorder (HCC) 10/09/2015   Nocturnal hypoxemia    Nocturnal hypoxemia 09/09/2011   June 2013 overnight oximetry on 6 centimeters of water CPAP: SpO2 less than 88% 72% of the time   Non-Obstructive CAD    a. 07/2009 Cath ([facility]): nonobs dzs;  b. 03/2011 Cath [facility]): nonobs dzs.   NSIP (nonspecific interstitial pneumonitis) (HCC) 12/08/2019   Osteoarthritis    a. Knees.   Other shoulder lesions, left shoulder 12/20/2017   Other specified postprocedural states 07/20/2013   Overweight (BMI 25.0-29.9) 08/29/2019   PAD (peripheral artery disease) (HCC)    Pain due to onychomycosis of toenails of both feet 12/05/2018   Pelvic mass in female 03/16/2019   02/21/19 CT pelvis w/o contrast  Cystic left adnexal lesion has enlarged since 2018 where it  measured approximately 1.8 x 1.7 cm. This is not well assessed due  to streak artifact as well as lack of contrast. Ultrasound follow-up  may be helpful in 8-12 weeks      04/05/19 TVUS     IMPRESSION:  Simple appearing cystic structure within the left ovary measuring up  to 2.9 cm. While this has enlarged s   Physical deconditioning 02/27/2021   Pleural effusion on left  12/18/2020   PUD (peptic ulcer disease)    Recurrent falls 09/01/2021   Recurrent pneumonia 07/23/2021   Repeated falls 12/18/2020   Respiratory failure with hypoxia (HCC) 01/12/2021   Rotator cuff tendinitis, right 07/26/2017   S/P lumbar fusion 05/23/2021   S/P right hip fracture    11/01/16 s/p repair   Sepsis due to pneumonia (HCC) 07/22/2021   Shoulder pain    Sleep apnea    no cpap / minimal   SOB (shortness of breath) 10/13/2011   2011 [facility] eval >> LHC (non-obstructive CAD), RHC (wedge 13, mean PA 26), TTE (LVEF > 55%, mild concentric LVH, mild AR, MR, PR, TR), CTAngio chest unrevealing; "well controlled asthma"  02/2011 TTE [facility]>> NL LVEF, Mild LVH, Mod AR/Mod MR   Spinal stenosis at L4-L5 level    severe with L4/L5 anterolisthesis grade 1 anterolisthesis    Spinal stenosis of lumbar region 09/15/2016   Last Assessment & Plan:   Now post op with continued back and leg pain noted. Is working with pt/ot and surgery was said to have no complications. Bowels are moving and general pain and anxiety is basically baseline.    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   Weakness of both lower extremities 12/18/2020   Social History   Socioeconomic History   Marital status: Widowed    Spouse name: richard   Number of children: 2   Years of education: Some Coll   Highest education level: Some college, no degree  Occupational History  Occupation: Retired    Comment: retired  Tobacco Use   Smoking status: Former    Current packs/day: 0.00    Average packs/day: 0.5 packs/day for 20.0 years (10.0 ttl pk-yrs)    Types: Cigarettes    Start date: 02/02/1954    Quit date: 02/02/1974    Years since quitting: 49.1   Smokeless tobacco: Never  Vaping Use   Vaping status: Never Used  Substance and Sexual Activity   Alcohol use: No   Drug use: No   Sexual activity: Not Currently  Other Topics Concern   Not on file  Social History Narrative   Lives at home in [place] grandson, his wife and their child.   Husband Bethanne Mule died covid 20 late 1/2037married x 51 years   Right-handed.   6 cups coffee per day.   Social Drivers of Corporate investment banker Strain: Low Risk  (09/22/2022)   Overall Financial Resource Strain (CARDIA)    Difficulty of Paying Living Expenses: Not hard at all  Food Insecurity: No Food Insecurity (03/24/2023)   Hunger Vital Sign    Worried About Running Out of Food in the Last Year: Never true    Ran Out of Food in the Last Year: Never true  Transportation Needs: No Transportation Needs (03/24/2023)   PRAPARE - Administrator, Civil Service (Medical): No    Lack of Transportation (Non-Medical): No  Physical Activity: Inactive (09/22/2022)   Exercise Vital Sign    Days of Exercise per Week: 0 days    Minutes of Exercise per Session: 0 min  Stress: No Stress Concern Present (09/22/2022)   [employer] of Occupational Health - Occupational Stress Questionnaire    Feeling of Stress : Only a little  Social Connections: Socially Isolated (03/24/2023)   Social Connection and Isolation Panel [NHANES]    Frequency of Communication with Friends and Family: More than three times a week    Frequency of Social Gatherings with Friends and Family: More than three times a week    Attends Religious Services: Never    Database administrator or Organizations: No    Attends Tax inspector Meetings: Never    Marital Status: Widowed   Family History  Problem Relation Age of Onset   Rheum arthritis Mother    Asthma Mother    Parkinson's disease Mother    Heart disease Mother    Stroke Mother    Hypertension Mother    Heart attack Father    Heart disease Father    Hypertension Father    Peripheral Artery Disease Father    Diabetes Son    Gout Son    Asthma Sister    Heart disease Sister    Lung cancer Sister    Heart disease Sister    Heart disease Sister    Breast cancer Sister    Heart attack Sister    Heart disease Brother    Heart disease Maternal Grandmother    Diabetes Maternal Grandmother    Colon cancer Maternal Grandmother    Cancer Maternal Grandmother        Hodgkins lymphoma   Heart disease Brother    Alcohol abuse Brother    Depression Brother    Dementia Son    Scheduled Meds:  amLODipine  5 mg Oral BID   apixaban  2.5 mg Oral BID   budesonide (PULMICORT) nebulizer solution  0.5 mg Nebulization BID   busPIRone  5 mg Oral BID  escitalopram  10 mg Oral Daily   feeding supplement  237 mL Oral BID   Gerhardt's butt cream   Topical TID   ipratropium-albuterol  3 mL Nebulization Q4H   lamoTRIgine  100 mg Oral BID   leflunomide  20 mg Oral Daily   methylPREDNISolone (SOLU-MEDROL) injection  40 mg Intravenous Daily   montelukast  10 mg Oral QHS   multivitamin  1 tablet Oral Daily   nebivolol  5 mg Oral BID   pravastatin  20 mg Oral q1800   Continuous Infusions:  azithromycin Stopped (03/24/23 2334)   cefTRIAXone (ROCEPHIN)  IV Stopped (03/24/23 2006)   [START ON 03/30/2023] vancomycin     PRN Meds:.acetaminophen **OR** acetaminophen, morphine injection, ondansetron **OR** ondansetron (ZOFRAN) IV, oxyCODONE-acetaminophen, senna-docusate Medications Prior to Admission:  Prior to Admission medications   Medication Sig Start Date End Date Taking? Authorizing Provider  albuterol (PROVENTIL) (2.5 MG/3ML) 0.083% nebulizer solution  Take 3 mLs (2.5 mg total) by nebulization every 6 (six) hours as needed for wheezing or shortness of breath. 11/17/22  Yes Salena Saner, MD  albuterol (VENTOLIN HFA) 108 (90 Base) MCG/ACT inhaler Inhale 2 puffs into the lungs every 6 (six) hours as needed for wheezing or shortness of breath. 06/24/21  Yes McLean-Scocuzza, Pasty Spillers, MD  amLODipine (NORVASC) 5 MG tablet Take 1 tablet (5 mg total) by mouth daily. Patient taking differently: Take 5 mg by mouth in the morning and at bedtime. 08/01/21  Yes McLean-Scocuzza, Pasty Spillers, MD  apixaban (ELIQUIS) 5 MG TABS tablet Take 5 mg by mouth 2 (two) times daily. 12/10/22  Yes [provider]  busPIRone (BUSPAR) 5 MG tablet Take 1 tablet (5 mg total) by mouth 2 (two) times daily. 06/02/21  Yes Lurene Shadow, MD  cetirizine (ZYRTEC) 10 MG tablet TAKE 1 TABLET BY MOUTH EVERYDAY AT BEDTIME 06/08/22  Yes Salena Saner, MD  dicyclomine (BENTYL) 10 MG capsule TAKE 1 CAPSULE (10 MG TOTAL) BY MOUTH 2 (TWO) TIMES DAILY AS NEEDED FOR SPASMS. 10/02/22  Yes Wyline Mood, MD  ENBREL SURECLICK 50 MG/ML injection Inject 50 mg into the skin once a week. Takes on Fridays   Yes [provider]  escitalopram (LEXAPRO) 10 MG tablet Take 1 tablet (10 mg total) by mouth daily. 08/01/21  Yes McLean-Scocuzza, Pasty Spillers, MD  famotidine (PEPCID) 20 MG tablet TAKE 1 TABLET BY MOUTH EVERYDAY AT BEDTIME 10/08/22  Yes Wyline Mood, MD  Fluticasone-Umeclidin-Vilant (TRELEGY ELLIPTA) 100-62.5-25 MCG/ACT AEPB Inhale 1 puff into the lungs daily. 12/01/22  Yes Salena Saner, MD  gabapentin (NEURONTIN) 300 MG capsule Take 300 mg in the morning and 600 mg at night. 01/11/23  Yes Dana Allan, MD  GEMTESA 75 MG TABS TAKE 75 MG BY MOUTH DAILY. 03/08/23  Yes Vaillancourt, Samantha, PA-C  lamoTRIgine (LAMICTAL) 100 MG tablet TAKE 1 TAB 2 TIMES DAILY. FURTHER REFILLS NEW PSYCH FOR ALL PSYCH MEDS ONLY TEMP SUPPLY FROM PCP 05/02/21  Yes McLean-Scocuzza, Pasty Spillers, MD  leflunomide (ARAVA) 20  MG tablet Take 1 tablet (20 mg total) by mouth daily. 09/30/17  Yes McLean-Scocuzza, Pasty Spillers, MD  lidocaine (LIDODERM) 5 % Place 1 patch onto the skin 2 (two) times daily as needed. Remove & Discard patch within 12 hours or as directed by MD 11/22/20  Yes McLean-Scocuzza, Pasty Spillers, MD  lovastatin (MEVACOR) 20 MG tablet Take 1 tablet (20 mg total) by mouth daily at 6 PM. 08/04/22  Yes Dana Allan, MD  methocarbamol (ROBAXIN) 500 MG tablet  Take 500 mg by mouth 3 (three) times daily.   Yes [provider]  mometasone (NASONEX) 50 MCG/ACT nasal spray USE 1 SPRAY TO EACH NOSTRIL TWICE A DAY 10/08/22  Yes Salena Saner, MD  montelukast (SINGULAIR) 10 MG tablet TAKE 1 TABLET BY MOUTH EVERY DAY 11/12/22  Yes Salena Saner, MD  multivitamin-lutein [facility]) CAPS capsule Take 1 capsule by mouth at bedtime.   Yes [provider]  nebivolol (BYSTOLIC) 5 MG tablet Take 1 tablet (5 mg total) by mouth in the morning and at bedtime. (Note dose changed from 1/2 10 mg bid to 5 mg bid) 08/01/21  Yes McLean-Scocuzza, Pasty Spillers, MD  OHTUVAYRE 3 MG/2.5ML SUSP Inhale 1 ampule into the lungs in the morning and at bedtime. 12/25/22  Yes [provider]  pantoprazole (PROTONIX) 40 MG tablet TAKE 1 TABLET BY MOUTH 2 TIMES DAILY 30 MIN BEFORE FOOD (NOTE REDUCTION IN FREQUENCY) 02/15/23  Yes Dana Allan, MD  predniSONE (DELTASONE) 10 MG tablet Take 10 mg by mouth daily with breakfast.   Yes [provider]  QUEtiapine (SEROQUEL) 50 MG tablet Take 50 mg by mouth at bedtime. 02/10/23  Yes [provider]  sucralfate (CARAFATE) 1 g tablet TAKE 1 TABLET BY MOUTH TWICE A DAY 11/02/22  Yes Wyline Mood, MD  torsemide (DEMADEX) 20 MG tablet Take 2 tablets (40 mg total) by mouth daily. 07/19/22 03/24/23 Yes Sreenath, Sudheer B, MD  OXYGEN Inhale 3 L into the lungs continuous.    [provider]  PROLIA 60 MG/ML SOSY injection Inject 60 mg into the skin every 6 (six) months. 03/09/22    [provider]   Allergies  Allergen Reactions   Ceftin [Cefuroxime Axetil] Anaphylaxis    Has toleratd unasyn, ceftriaxone, zosyn   Lisinopril Anaphylaxis   Sulfa Antibiotics Other (See Comments)    Face swelling   Sulfasalazine Anaphylaxis   Morphine Other (See Comments)    Per patient, low blood pressure issues that requires action to raise it back up. Can take small infrequent doses   Xarelto [Rivaroxaban] Other (See Comments)    Stomach burning, bleeding, and tar in stool   Adhesive [Tape] Rash    Paper tape and tega derm OK   Antihistamines, Chlorpheniramine-Type Other (See Comments)    Makes pt hyper   Antivert [Meclizine Hcl] Other (See Comments)    Bladder will not empty   Aspirin Other (See Comments)    Sulfasalazine allergy cross reacts   Contrast Media [Iodinated Contrast Media] Rash    she is able to use betadine scrubs.   Decongestant [Pseudoephedrine Hcl] Other (See Comments)    Makes pt hyper   Doxycycline Other (See Comments)    GI upset   Levaquin [Levofloxacin In D5w] Rash   Polymyxin B Other (See Comments)    Facial rash   Tetanus Toxoids Rash and Other (See Comments)    Fever and hot to touch at injection site   Review of Systems  Respiratory:  Positive for cough and shortness of breath.   Psychiatric/Behavioral:  The patient is nervous/anxious.     Physical Exam Pulmonary:     Comments: Work of breathing noted.  Moist breathing noted stay at bedside. Neurological:     Mental Status: She is alert.  Psychiatric:     Comments: Appears anxious     Vital Signs: BP (!) 112/59   Pulse 99   Temp 97.8 F (36.6 C) (Oral)   Resp (!) 24  Ht 4\' 11"  (1.499 m)   Wt 79.4 kg   SpO2 98%   BMI 35.35 kg/m  Pain Scale: 0-10   Pain Score: Asleep   SpO2: SpO2: 98 % O2 Device:SpO2: 98 % O2 Flow Rate: .O2 Flow Rate (L/min): 60 L/min  IO: Intake/output summary:  Intake/Output Summary (Last 24 hours) at 03/25/2023 1108 Last data filed at  03/24/2023 2334 Gross per 24 hour  Intake 350 ml  Output --  Net 350 ml    LBM: Last BM Date : 03/24/23 Baseline Weight: Weight: 79.4 kg Most recent weight: Weight: 79.4 kg       Signed by: Morton Stall, NP   Please contact Palliative Medicine Team phone at [phone] for questions and concerns.  For individual provider: See Loretha Stapler

## 2023-03-26 MED ORDER — GLYCOPYRROLATE 0.2 MG/ML IJ SOLN
0.2000 mg | INTRAMUSCULAR | Status: DC | PRN
  Administered 2023-03-26: 0.2 mg via INTRAVENOUS

## 2023-03-26 MED ORDER — NOREPINEPHRINE 4 MG/250ML-% IV SOLN
2.0000 ug/min | INTRAVENOUS | Status: DC
  Administered 2023-03-26: 2 ug/min via INTRAVENOUS

## 2023-03-26 MED ORDER — MORPHINE BOLUS VIA INFUSION
5.0000 mg | INTRAVENOUS | Status: DC | PRN
  Administered 2023-03-26 (×3): 5 mg via INTRAVENOUS

## 2023-03-26 MED ORDER — MORPHINE 100MG IN NS 100ML (1MG/ML) PREMIX INFUSION
0.0000 mg/h | INTRAVENOUS | Status: DC
  Administered 2023-03-26: 5 mg/h via INTRAVENOUS
  Filled 2023-03-26: qty 100

## 2023-03-26 MED ORDER — GLYCOPYRROLATE 0.2 MG/ML IJ SOLN
0.2000 mg | INTRAMUSCULAR | Status: DC | PRN
  Filled 2023-03-26: qty 1

## 2023-03-26 MED ORDER — SODIUM CHLORIDE 0.9 % IV SOLN: 250.0000 mL | INTRAVENOUS | Status: DC

## 2023-03-26 MED ORDER — LORAZEPAM 2 MG/ML IJ SOLN
2.0000 mg | INTRAMUSCULAR | Status: DC | PRN
  Filled 2023-03-26 (×2): qty 1

## 2023-03-26 MED ORDER — POLYVINYL ALCOHOL 1.4 % OP SOLN: 1.0000 [drp] | Freq: Four times a day (QID) | OPHTHALMIC | Status: DC | PRN

## 2023-03-26 MED ORDER — SODIUM CHLORIDE 0.9% FLUSH: 3.0000 mL | INTRAVENOUS | Status: DC | PRN

## 2023-03-26 MED ORDER — SODIUM CHLORIDE 0.9% FLUSH: 3.0000 mL | Freq: Two times a day (BID) | INTRAVENOUS | Status: DC

## 2023-03-26 MED ORDER — METHYLPREDNISOLONE SODIUM SUCC 125 MG IJ SOLR: 120.0000 mg | INTRAMUSCULAR | Status: DC

## 2023-03-26 MED ORDER — GLYCOPYRROLATE 1 MG PO TABS: 1.0000 mg | ORAL_TABLET | ORAL | Status: DC | PRN

## 2023-03-26 MED ORDER — MORPHINE SULFATE (PF) 2 MG/ML IV SOLN
2.0000 mg | Freq: Once | INTRAVENOUS | Status: AC
  Administered 2023-03-26: 2 mg via INTRAVENOUS

## 2023-03-27 LAB — URINE CULTURE: Culture: >=100000 — AB

## 2023-03-28 LAB — CULTURE, BLOOD (ROUTINE X 2)
Culture: NO GROWTH
Culture: NO GROWTH

## 2023-03-28 LAB — LEGIONELLA PNEUMOPHILA SEROGP 1 UR AG: L. pneumophila Serogp 1 Ur Ag: NEGATIVE

## 2023-03-29 ENCOUNTER — Ambulatory Visit: Payer: Self-pay | Admitting: Physician Assistant

## 2023-03-30 LAB — CULTURE, RESPIRATORY W GRAM STAIN

## 2023-03-31 LAB — FUNGITELL BETA-D-GLUCAN
Fungitell Value:: 85.046 pg/mL
Result Name:: POSITIVE — AB

## 2023-04-03 NOTE — IPAL (Signed)
  Interdisciplinary Goals of Care Family Meeting   Date carried out: 03/31/2023  Location of the meeting: Bedside  Member's involved: Nurse Practitioner, Bedside Registered Nurse, and Family Member or next of kin  Durable Power of Attorney or acting medical decision maker: Kerri Carter, son    Discussion: We discussed goals of care for Kerri Carter. [name]'s clinical status deteriorated overnight with worsening hypoxia and hypotension requiring peripheral vasopressor support. Imaging reveals worsening ARDS. Patient appears to be in the dying process. After discussion with POA son Kerri Carter and family conference, decision was made to focus on the patient's comfort.  Code status:   Code Status: Do not attempt resuscitation (DNR) - Comfort care   Disposition: In-patient comfort care  Time spent for the meeting: 15 minutes   Kerri Carter, AGACNP-BC Acute Care Nurse Practitioner [facility]   [phone] / [phone] Please see Amion for details.

## 2023-04-03 NOTE — Discharge Summary (Signed)
DEATH SUMMARY   Patient Details  Name: Kerri Carter MRN: [id number] DOB: 1940-05-14 WUJ:[id number], Kenney Houseman, MD  Admission/Discharge Information   Admit Date:  Apr 03, 2023  Date of Death: Date of Death: 04-06-23  Time of Death: Time of Death: 0225  Length of Stay: 3   Principle Cause of death: Multifocal pneumonia  Hospital Diagnoses: Principal Problem:   Severe sepsis (HCC) Active Problems:   Cellulitis of left lower leg   Hypokalemia   Influenza A   Multifocal pneumonia   Hospital Course: No notes on file   83 y.o. female with medical history significant for ILD and COPD on 2-3 L oxygen, hypertension, hyperlipidemia, PVD, CAD, dCHF, depression with anxiety, bipolar, CKD 3B, A-fib on Eliquis, OSA on CPAP, obesity, who presented via EMS for evaluation of generalized weakness, altered mental status and worsening wound.  Per son, patient was doing well until Sunday she started having significant weakness and some disorientation. On Apr 09, 2023, there was slight improvement in her mental status. Wound care came to check patient's left ankle wound and noticed purulent drainage. Today, patient was found to have a temperature of 102 with worsening weakness so they called EMS.  On their arrival, patient had a temperature of 103. Patient reports worsening cough that have become productive over the last few days. She also endorsed left flank pain, fatigue, chills and poor appetite but denies any nausea, vomiting, abdominal pain, chest pain, dizziness, palpitations, dysuria, or diarrhea. Son reports that multiple family members have the flu at home.   On arrival patient was also found to have a multifocal pneumonia and resultant acute on chronic hypoxic respiratory failure.  Urinary antigen for Streptococcus pneumonia positive.  Patient was maintained on aggressive therapy of broad-spectrum IV antibiotics, intravenous steroids, intermittent Lasix, BiPAP.  She was unable to wean from NIPPV.  Admitted to  stepdown unit with PCCM and palliative care consulting.  Multiple conversations with the patient's son who is understanding of the patient's dire clinical situation.  Unfortunately despite maximal medical therapy the patient had worsening respiratory distress noted on 2/20 1 AM.  ICU NP and TRH NP both contacted.  Son contacted.  Decision made after conversation with ICU NP to de-escalate further and transition to full comfort measures.  Patient was made comfort care at 1:14 AM on Apr 06, 2023.  Morphine gtt. initiated.  NIPPV discontinued.  Patient passed to 2:25 AM on 2023-04-06.  Condolences provided to family.   Assessment and Plan: No notes have been filed under this hospital service. Service: Hospitalist  # Severe sepsis # Multifocal pneumonia secondary to S. pneumo # Influenza A infection # Cellulitis of the left lower extremity # COPD # Acute on Chronic hypoxic respiratory failure Despite maximal medical therapy patient continued to deteriorate.  Worsening infiltrate noted on chest x-ray.  Worsening hypoxia and hypotension noted.  Inability to wean from NIPPV.  Patient remained DNR/DNI.  Decision made to transition to full comfort measures at 114 on 04-06-23.  Patient passed away peacefully at 225 on 04/06/2023.  Family present.      Procedures: None  Consultations: PCCM, Palliative care  The results of significant diagnostics from this hospitalization (including imaging, microbiology, ancillary and laboratory) are listed below for reference.   Significant Diagnostic Studies: DG Chest Port 1 View Result Date: 04-06-2023 CLINICAL DATA:  [id number] Acute respiratory failure with hypoxia [facility]) [id number] EXAM: PORTABLE CHEST 1 VIEW COMPARISON:  Chest x-ray 03/24/2023 FINDINGS: The heart and mediastinal contours are unchanged. Grossly stable bilateral patchy airspace opacities  with a more consolidative airspace opacity within the left mid lung zone. No pulmonary edema. Bilateral trace to small pleural effusions.  No pneumothorax. No acute osseous abnormality. Total bilateral reverse shoulder arthroplasty. IMPRESSION: 1. Grossly stable bilateral patchy airspace opacities with a more consolidative airspace opacity within the left mid lung zone. 2. Bilateral trace to small pleural effusions. Electronically Signed   By: Tish Frederickson M.D.   On: 03/17/2023 00:15   ECHOCARDIOGRAM COMPLETE Result Date: 03/25/2023    ECHOCARDIOGRAM REPORT   Patient Name:   Kerri Carter Date of Exam: 03/25/2023 Medical Rec #:  [id number]     Height:       59.0 in Accession #:    [id number]    Weight:       175.0 lb Date of Birth:  January 09, 1941     BSA:          1.743 m Patient Age:    82 years      BP:           113/60 mmHg Patient Gender: F             HR:           93 bpm. Exam Location:  [facility] Procedure: 2D Echo, Cardiac Doppler and Color Doppler (Both Spectral and Color            Flow Doppler were utilized during procedure). Indications:     Acute respiratory distress R06.03  History:         Patient has prior history of Echocardiogram examinations, most                  recent 03/06/2022. CHF, COPD; Signs/Symptoms:Murmur.  Sonographer:     Cristela Blue Referring Phys:  [id number] Judithe Modest Diagnosing Phys: Julien Nordmann MD  Sonographer Comments: Technically challenging study due to limited acoustic windows, no parasternal window and no subcostal window. Image acquisition challenging due to COPD. IMPRESSIONS  1. Left ventricular ejection fraction, by estimation, is 60 to 65%. The left ventricle has normal function. The left ventricle has no regional wall motion abnormalities. Left ventricular diastolic parameters are consistent with Grade II diastolic dysfunction (pseudonormalization).  2. Right ventricular systolic function is normal. The right ventricular size is normal.  3. The mitral valve is normal in structure. Moderate mitral valve regurgitation. Mild to moderate mitral stenosis. The mean mitral valve gradient is 8.0 mmHg. Moderate  mitral annular calcification.  4. The aortic valve is normal in structure. There is moderate calcification of the aortic valve. Aortic valve regurgitation is mild. Moderate aortic valve stenosis. Aortic valve mean gradient measures 19.0 mmHg.  5. The inferior vena cava is normal in size with greater than 50% respiratory variability, suggesting right atrial pressure of 3 mmHg. FINDINGS  Left Ventricle: Left ventricular ejection fraction, by estimation, is 60 to 65%. The left ventricle has normal function. The left ventricle has no regional wall motion abnormalities. Strain imaging was not performed. The left ventricular internal cavity  size was normal in size. There is no left ventricular hypertrophy. Left ventricular diastolic parameters are consistent with Grade II diastolic dysfunction (pseudonormalization). Right Ventricle: The right ventricular size is normal. No increase in right ventricular wall thickness. Right ventricular systolic function is normal. Left Atrium: Left atrial size was normal in size. Right Atrium: Right atrial size was normal in size. Pericardium: There is no evidence of pericardial effusion. Mitral Valve: The mitral valve is normal in structure. There is mild calcification of  the mitral valve leaflet(s). Moderate mitral annular calcification. Moderate mitral valve regurgitation. Mild to moderate mitral valve stenosis. MV peak gradient, 15.2 mmHg. The mean mitral valve gradient is 8.0 mmHg. Tricuspid Valve: The tricuspid valve is normal in structure. Tricuspid valve regurgitation is not demonstrated. No evidence of tricuspid stenosis. Aortic Valve: The aortic valve is normal in structure. There is moderate calcification of the aortic valve. Aortic valve regurgitation is mild. Moderate aortic stenosis is present. Aortic valve mean gradient measures 19.0 mmHg. Aortic valve peak gradient  measures 34.5 mmHg. Aortic valve area, by VTI measures 1.15 cm. Pulmonic Valve: The pulmonic valve was  normal in structure. Pulmonic valve regurgitation is not visualized. No evidence of pulmonic stenosis. Aorta: The aortic root is normal in size and structure. Venous: The inferior vena cava is normal in size with greater than 50% respiratory variability, suggesting right atrial pressure of 3 mmHg. IAS/Shunts: No atrial level shunt detected by color flow Doppler. Additional Comments: 3D imaging was not performed.  LEFT VENTRICLE PLAX 2D LVIDd:         3.80 cm   Diastology LVIDs:         2.50 cm   LV e' medial:    4.90 cm/s LV PW:         1.00 cm   LV E/e' medial:  34.1 LV IVS:        1.40 cm   LV e' lateral:   5.00 cm/s LVOT diam:     2.00 cm   LV E/e' lateral: 33.4 LV SV:         69 LV SV Index:   39 LVOT Area:     3.14 cm  RIGHT VENTRICLE RV Basal diam:  2.70 cm RV Mid diam:    2.50 cm RV S prime:     11.10 cm/s TAPSE (M-mode): 2.7 cm LEFT ATRIUM             Index        RIGHT ATRIUM           Index LA diam:        4.00 cm 2.30 cm/m   RA Area:     11.10 cm LA Vol (A2C):   62.2 ml 35.69 ml/m  RA Volume:   21.40 ml  12.28 ml/m LA Vol (A4C):   47.0 ml 26.97 ml/m LA Biplane Vol: 55.0 ml 31.56 ml/m  AORTIC VALVE AV Area (Vmax):    1.12 cm AV Area (Vmean):   1.05 cm AV Area (VTI):     1.15 cm AV Vmax:           293.67 cm/s AV Vmean:          205.667 cm/s AV VTI:            0.599 m AV Peak Grad:      34.5 mmHg AV Mean Grad:      19.0 mmHg LVOT Vmax:         105.00 cm/s LVOT Vmean:        68.600 cm/s LVOT VTI:          0.219 m LVOT/AV VTI ratio: 0.37 MITRAL VALVE                TRICUSPID VALVE MV Area (PHT): 4.49 cm     TR Peak grad:   15.4 mmHg MV Area VTI:   1.84 cm     TR Vmax:        196.00 cm/s MV Peak  grad:  15.2 mmHg MV Mean grad:  8.0 mmHg     SHUNTS MV Vmax:       1.95 m/s     Systemic VTI:  0.22 m MV Vmean:      129.0 cm/s   Systemic Diam: 2.00 cm MV Decel Time: 169 msec MV E velocity: 167.00 cm/s MV A velocity: 139.00 cm/s MV E/A ratio:  1.20 Julien Nordmann MD Electronically signed by Julien Nordmann  MD Signature Date/Time: 03/25/2023/5:13:11 PM    Final    DG Chest Port 1 View Result Date: 03/24/2023 CLINICAL DATA:  Dyspnea EXAM: PORTABLE CHEST 1 VIEW COMPARISON:  03/24/2023 FINDINGS: Multifocal pulmonary consolidation in keeping with multifocal infection appears progressive within the right upper lobe since prior examination. Small bilateral pleural effusions again noted. No pneumothorax. Cardiac size within normal limits. Vascularity is normal. No acute bone abnormality. Healed right fourth rib fracture. Bilateral total shoulder arthroplasty at lumbar fusion hardware partially visualized. IMPRESSION: 1. Progressive multifocal infection. 2. Small bilateral pleural effusions. Electronically Signed   By: Helyn Numbers M.D.   On: 03/24/2023 23:11   DG Chest Port 1 View Result Date: 03/24/2023 CLINICAL DATA:  83 year old female with shortness of breath, hypoxia. EXAM: PORTABLE CHEST 1 VIEW COMPARISON:  Chest radiographs yesterday and earlier. FINDINGS: Portable AP upright view at 0642 hours. Progressive, now moderate to large area of abnormal airspace opacity in the left lower lung. Larger lung volumes compared to yesterday. Stable cardiac size and mediastinal contours. No pneumothorax. Chronic right upper rib deformities which appears to account for the stable asymmetric right upper lung opacity. Small left pleural effusion not excluded. No pneumothorax or pulmonary edema. Bilateral shoulder arthroplasty. Paucity of bowel gas in the visible abdomen. IMPRESSION: 1. Increasing left mid and lower lung airspace opacity since yesterday compatible with Worsening Pneumonia. Possible small pleural effusion(s). 2. Chronic right upper rib deformities felt to account for asymmetric right upper lung opacity. Electronically Signed   By: Odessa Fleming M.D.   On: 03/24/2023 06:56   DG Ankle Complete Left Result Date: 03/23/2023 CLINICAL DATA:  Wound infection. Purulence drainage from anterior ankle. EXAM: LEFT ANKLE COMPLETE  - 3+ VIEW COMPARISON:  Tibia/fibula radiograph 02/24/2023 FINDINGS: There is no evidence of fracture, dislocation, or joint effusion. Ankle mortise is preserved. No erosion or periostitis. Anterior soft tissue edema. No radiopaque foreign body or soft tissue gas. IMPRESSION: Anterior soft tissue edema. No radiographic evidence of osteomyelitis. Electronically Signed   By: Narda Rutherford M.D.   On: 03/23/2023 18:27   DG Chest 2 View Result Date: 03/23/2023 CLINICAL DATA:  Suspected Sepsis Pain. EXAM: CHEST - 2 VIEW COMPARISON:  Chest radiograph 02/11/2023 FINDINGS: Patient's chin obscures the apex. Stable heart size and mediastinal contours, mild cardiomegaly. Multifocal lung opacities, greatest in the bases, suspicious for pneumonia. No pneumothorax or large pleural effusion. Exaggerated thoracic kyphosis. Bilateral reverse shoulder arthroplasties. Remote bilateral rib fractures. IMPRESSION: Patchy multifocal lung opacities, greatest in the bases, suspicious for multifocal pneumonia. Electronically Signed   By: Narda Rutherford M.D.   On: 03/23/2023 16:34   CT Head Wo Contrast Result Date: 03/11/2023 CLINICAL DATA:  Dizziness EXAM: CT HEAD WITHOUT CONTRAST TECHNIQUE: Contiguous axial images were obtained from the base of the skull through the vertex without intravenous contrast. RADIATION DOSE REDUCTION: This exam was performed according to the departmental dose-optimization program which includes automated exposure control, adjustment of the mA and/or kV according to patient size and/or use of iterative reconstruction technique. COMPARISON:  02/22/2023 CT head  FINDINGS: Brain: No evidence of acute infarction, hemorrhage, mass, mass effect, or midline shift. No hydrocephalus or extra-axial fluid collection. Age related cerebral atrophy. Periventricular white matter changes, likely the sequela of chronic small vessel ischemic disease. Ex vacuo dilatation the ventricles. Vascular: No hyperdense vessel. Skull:  Negative for fracture or focal lesion. Sinuses/Orbits: Mucosal thickening in the left-greater-than-right mastoid air cells. Status post bilateral lens replacements. Other: The mastoid air cells are well aerated. IMPRESSION: No acute intracranial process. Electronically Signed   By: Wiliam Ke M.D.   On: 03/11/2023 15:36   DG Finger Thumb Left Result Date: 03/05/2023 CLINICAL DATA:  Following ago.  Thumb pain. EXAM: LEFT THUMB 2+V COMPARISON:  None Available. FINDINGS: There is diffuse decreased bone mineralization. Moderate to severe dorsal and lateral thumb interphalangeal joint space narrowing, subchondral sclerosis, and dorsal osteophytosis. There is less than 1 mm cortical step-off at the medial/ulnar aspect of the proximal metaphysis of the distal phalanx of the thumb on frontal view. There is oblique linear lucency within the mid to volar aspect of the proximal aspect of the distal phalanx of the thumb on lateral view. These findings are suspicious for an acute nondisplaced fracture that comes close to the proximal articular surface without definitely extending through the articular surface. Moderate to severe dorsal thumb metacarpophalangeal joint space narrowing, subchondral sclerosis, and peripheral osteophytosis. Moderate to severe thumb carpometacarpal joint space narrowing and subchondral sclerosis with mild right peripheral osteophytosis. Moderate triscaphe joint space narrowing. Moderate distal radioulnar joint space narrowing. Old nonunited distal ulnar styloid fracture. IMPRESSION: 1. Acute nondisplaced fracture of the proximal metaphysis of the distal phalanx of the thumb. 2. Moderate to severe thumb interphalangeal, thumb metacarpophalangeal, and thumb carpometacarpal osteoarthritis. Electronically Signed   By: Neita Garnet M.D.   On: 03/05/2023 18:58   DG Hip Unilat W or Wo Pelvis 2-3 Views Left Result Date: 02/24/2023 CLINICAL DATA:  Fall in shower bilateral hip pain EXAM: DG HIP (WITH  OR WITHOUT PELVIS) 2-3V LEFT COMPARISON:  03/18/2022, 09/08/2021 FINDINGS: Lumbar spine hardware. SI joints are non widened. Pubic symphysis and rami are intact. Bilateral hip replacements with normal alignment. No acute fracture is seen IMPRESSION: No acute osseous abnormality. Bilateral hip replacements. Electronically Signed   By: Jasmine Pang M.D.   On: 02/24/2023 15:33   DG Tibia/Fibula Left Result Date: 02/24/2023 CLINICAL DATA:  Fall, laceration EXAM: LEFT TIBIA AND FIBULA - 2 VIEW COMPARISON:  None Available. FINDINGS: No definitive fracture or malalignment. Probable laceration to the lateral aspect of the distal lower leg. No radiopaque foreign body IMPRESSION: No acute osseous abnormality. Electronically Signed   By: Jasmine Pang M.D.   On: 02/24/2023 15:32    Microbiology: Recent Results (from the past 240 hours)  Culture, blood (Routine x 2)     Status: None (Preliminary result)   Collection Time: 03/23/23  1:59 PM   Specimen: BLOOD  Result Value Ref Range Status   Specimen Description BLOOD BLOOD RIGHT WRIST  Final   Special Requests   Final    BOTTLES DRAWN AEROBIC AND ANAEROBIC Blood Culture results may not be optimal due to an inadequate volume of blood received in culture bottles   Culture   Final    NO GROWTH 3 DAYS Performed at [facility], [address], [place], [place] [address]    Report Status PENDING  Incomplete  Urine Culture     Status: Abnormal (Preliminary result)   Collection Time: 03/23/23  1:59 PM   Specimen: Urine,  Random  Result Value Ref Range Status   Specimen Description   Final    URINE, RANDOM Performed at [facility], [address] [place], [place] [address]    Special Requests   Final    NONE Reflexed from [id number] Performed at [facility], [address] [place], [place] [address]    Culture (A)  Final    >=100,000 COLONIES/mL ENTEROCOCCUS FAECALIS 50,000 COLONIES/mL ENTEROCOCCUS FAECIUM 20,000  COLONIES/mL PSEUDOMONAS AERUGINOSA    Report Status PENDING  Incomplete  Culture, blood (Routine x 2)     Status: None (Preliminary result)   Collection Time: 03/23/23  4:12 PM   Specimen: BLOOD  Result Value Ref Range Status   Specimen Description BLOOD RIGHT ANTECUBITAL  Final   Special Requests   Final    BOTTLES DRAWN AEROBIC AND ANAEROBIC Blood Culture results may not be optimal due to an inadequate volume of blood received in culture bottles   Culture   Final    NO GROWTH 3 DAYS Performed at [facility], [address], [place], [place] [address]    Report Status PENDING  Incomplete  Resp panel by RT-PCR (RSV, Flu A&B, Covid) Anterior Nasal Swab     Status: Abnormal   Collection Time: 03/23/23  4:12 PM   Specimen: Anterior Nasal Swab  Result Value Ref Range Status   SARS Coronavirus 2 by RT PCR NEGATIVE NEGATIVE Final    Comment: (NOTE) SARS-CoV-2 target nucleic acids are NOT DETECTED.  The SARS-CoV-2 RNA is generally detectable in upper respiratory specimens during the acute phase of infection. The lowest concentration of SARS-CoV-2 viral copies this assay can detect is 138 copies/mL. A negative result does not preclude SARS-Cov-2 infection and should not be used as the sole basis for treatment or other patient management decisions. A negative result may occur with  improper specimen collection/handling, submission of specimen other than nasopharyngeal swab, presence of viral mutation(s) within the areas targeted by this assay, and inadequate number of viral copies(<138 copies/mL). A negative result must be combined with clinical observations, patient history, and epidemiological information. The expected result is Negative.  Fact Sheet for Patients:  BloggerCourse.com  Fact Sheet for Healthcare Providers:  SeriousBroker.it  This test is no t yet approved or cleared by the [place] FDA and  has been  authorized for detection and/or diagnosis of SARS-CoV-2 by FDA under an Emergency Use Authorization (EUA). This EUA will remain  in effect (meaning this test can be used) for the duration of the COVID-19 declaration under Section 564(b)(1) of the Act, 21 U.S.C.section 360bbb-3(b)(1), unless the authorization is terminated  or revoked sooner.       Influenza A by PCR POSITIVE (A) NEGATIVE Final   Influenza B by PCR NEGATIVE NEGATIVE Final    Comment: (NOTE) The Xpert Xpress SARS-CoV-2/FLU/RSV plus assay is intended as an aid in the diagnosis of influenza from Nasopharyngeal swab specimens and should not be used as a sole basis for treatment. Nasal washings and aspirates are unacceptable for Xpert Xpress SARS-CoV-2/FLU/RSV testing.  Fact Sheet for Patients: BloggerCourse.com  Fact Sheet for Healthcare Providers: SeriousBroker.it  This test is not yet approved or cleared by the [place] FDA and has been authorized for detection and/or diagnosis of SARS-CoV-2 by FDA under an Emergency Use Authorization (EUA). This EUA will remain in effect (meaning this test can be used) for the duration of the COVID-19 declaration under Section 564(b)(1) of the Act, 21 U.S.C. section 360bbb-3(b)(1), unless the  authorization is terminated or revoked.     Resp Syncytial Virus by PCR NEGATIVE NEGATIVE Final    Comment: (NOTE) Fact Sheet for Patients: BloggerCourse.com  Fact Sheet for Healthcare Providers: SeriousBroker.it  This test is not yet approved or cleared by the [place] FDA and has been authorized for detection and/or diagnosis of SARS-CoV-2 by FDA under an Emergency Use Authorization (EUA). This EUA will remain in effect (meaning this test can be used) for the duration of the COVID-19 declaration under Section 564(b)(1) of the Act, 21 U.S.C. section 360bbb-3(b)(1), unless the  authorization is terminated or revoked.  Performed at [facility], [address] [place], [place] [address]   Expectorated Sputum Assessment w Gram Stain, Rflx to Resp Cult     Status: None   Collection Time: 03/23/23  9:29 PM   Specimen: Sputum  Result Value Ref Range Status   Specimen Description SPUTUM  Final   Special Requests NONE  Final   Sputum evaluation   Final    THIS SPECIMEN IS ACCEPTABLE FOR SPUTUM CULTURE Performed at [facility], [address] [place], [place] [address]    Report Status 03/23/2023 FINAL  Final  Culture, Respiratory w Gram Stain     Status: None (Preliminary result)   Collection Time: 03/23/23  9:29 PM   Specimen: SPU  Result Value Ref Range Status   Specimen Description   Final    SPUTUM Performed at [facility], [address] [place], [place] [address]    Special Requests   Final    NONE Reflexed from [phone] Performed at [facility], [address] [place], [place] [address]    Gram Stain   Final    FEW WBC PRESENT,BOTH PMN AND MONONUCLEAR FEW GRAM POSITIVE COCCI IN PAIRS FEW YEAST    Culture   Final    CULTURE REINCUBATED FOR BETTER GROWTH Performed at [facility], [address] [place], [place] [address]    Report Status PENDING  Incomplete  MRSA Next Gen by PCR, Nasal     Status: None   Collection Time: 03/25/23 10:01 AM   Specimen: Nasal Mucosa; Nasal Swab  Result Value Ref Range Status   MRSA by PCR Next Gen NOT DETECTED NOT DETECTED Final    Comment: (NOTE) The GeneXpert MRSA Assay (FDA approved for NASAL specimens only), is one component of a comprehensive MRSA colonization surveillance program. It is not intended to diagnose MRSA infection nor to guide or monitor treatment for MRSA infections. Test performance is not FDA approved in patients less than 21 years old. Performed at [facility], [address] [place], [place] [address]     Time spent: 45  minutes  Signed: Tresa Moore, MD 03/17/2023

## 2023-04-03 NOTE — Significant Event (Signed)
Pt became increasing short or breath and in more generalized pain around midnight, requesting more morphine. RR was in the 30s, HR in the 120 and O2 in the low 80s while on 100% BiPAP. Hospitalist NP notified and another dose of 2mg  of Morphine was given, CXR was done and EKG was done. Son was called and came at bedside, during that time levophed was started for Hypotension and Critical Care NP spoke with son and other family members at bedside about goals of care. Pt made comfort care at 0114 and morphine gtt started, BiPAP was taken off and pt passed at 0225. family took pt belongings with them.

## 2023-04-03 DEATH — deceased

## 2023-04-05 ENCOUNTER — Ambulatory Visit: Payer: Self-pay | Admitting: Physician Assistant

## 2023-04-05 ENCOUNTER — Ambulatory Visit: Payer: Medicare PPO | Admitting: Family Medicine

## 2023-04-05 ENCOUNTER — Ambulatory Visit: Payer: Medicare PPO | Admitting: Podiatry

## 2023-04-06 ENCOUNTER — Encounter: Payer: Medicare PPO | Admitting: Internal Medicine

## 2023-04-12 ENCOUNTER — Ambulatory Visit: Payer: Self-pay | Admitting: Physician Assistant

## 2023-04-19 ENCOUNTER — Ambulatory Visit: Payer: Self-pay | Admitting: Physician Assistant

## 2023-04-26 ENCOUNTER — Ambulatory Visit: Payer: Self-pay | Admitting: Physician Assistant

## 2023-05-03 ENCOUNTER — Ambulatory Visit: Payer: Self-pay | Admitting: Physician Assistant

## 2023-05-07 ENCOUNTER — Encounter: Payer: Medicare PPO | Admitting: Internal Medicine

## 2023-05-10 ENCOUNTER — Ambulatory Visit: Payer: Self-pay | Admitting: Physician Assistant

## 2023-05-17 ENCOUNTER — Ambulatory Visit: Payer: Self-pay | Admitting: Physician Assistant

## 2023-05-24 ENCOUNTER — Ambulatory Visit: Payer: Medicare PPO | Admitting: Physician Assistant

## 2023-06-14 ENCOUNTER — Ambulatory Visit: Payer: Self-pay | Admitting: Physician Assistant

## 2023-12-21 NOTE — Telephone Encounter (Signed)
 open in error

## 2023-12-23 NOTE — Telephone Encounter (Signed)
 open in error

## 2024-01-04 ENCOUNTER — Ambulatory Visit: Payer: Self-pay | Admitting: Physician Assistant
